# Patient Record
Sex: Female | Born: 1950
Health system: Southern US, Community
[De-identification: ages and names within clinical notes are randomized; demographics above are authoritative.]

## PROBLEM LIST (undated history)

## (undated) DIAGNOSIS — G4734 Idiopathic sleep related nonobstructive alveolar hypoventilation: Secondary | ICD-10-CM

## (undated) DIAGNOSIS — D509 Iron deficiency anemia, unspecified: Secondary | ICD-10-CM

## (undated) DIAGNOSIS — R0600 Dyspnea, unspecified: Secondary | ICD-10-CM

## (undated) DIAGNOSIS — I050 Rheumatic mitral stenosis: Secondary | ICD-10-CM

## (undated) DIAGNOSIS — IMO0001 Reserved for inherently not codable concepts without codable children: Secondary | ICD-10-CM

## (undated) DIAGNOSIS — E118 Type 2 diabetes mellitus with unspecified complications: Secondary | ICD-10-CM

## (undated) DIAGNOSIS — Z8701 Personal history of pneumonia (recurrent): Secondary | ICD-10-CM

## (undated) DIAGNOSIS — M1990 Unspecified osteoarthritis, unspecified site: Secondary | ICD-10-CM

## (undated) DIAGNOSIS — I700 Atherosclerosis of aorta: Secondary | ICD-10-CM

## (undated) DIAGNOSIS — E1140 Type 2 diabetes mellitus with diabetic neuropathy, unspecified: Secondary | ICD-10-CM

## (undated) DIAGNOSIS — F329 Major depressive disorder, single episode, unspecified: Secondary | ICD-10-CM

## (undated) DIAGNOSIS — I872 Venous insufficiency (chronic) (peripheral): Secondary | ICD-10-CM

## (undated) DIAGNOSIS — D369 Benign neoplasm, unspecified site: Secondary | ICD-10-CM

## (undated) DIAGNOSIS — Z9989 Dependence on other enabling machines and devices: Secondary | ICD-10-CM

## (undated) DIAGNOSIS — L821 Other seborrheic keratosis: Secondary | ICD-10-CM

## (undated) DIAGNOSIS — K219 Gastro-esophageal reflux disease without esophagitis: Secondary | ICD-10-CM

## (undated) DIAGNOSIS — J439 Emphysema, unspecified: Secondary | ICD-10-CM

## (undated) DIAGNOSIS — K8020 Calculus of gallbladder without cholecystitis without obstruction: Secondary | ICD-10-CM

## (undated) DIAGNOSIS — G43909 Migraine, unspecified, not intractable, without status migrainosus: Secondary | ICD-10-CM

## (undated) DIAGNOSIS — J189 Pneumonia, unspecified organism: Secondary | ICD-10-CM

## (undated) DIAGNOSIS — F322 Major depressive disorder, single episode, severe without psychotic features: Secondary | ICD-10-CM

## (undated) DIAGNOSIS — J45909 Unspecified asthma, uncomplicated: Secondary | ICD-10-CM

## (undated) DIAGNOSIS — I6529 Occlusion and stenosis of unspecified carotid artery: Secondary | ICD-10-CM

## (undated) DIAGNOSIS — Z720 Tobacco use: Secondary | ICD-10-CM

## (undated) DIAGNOSIS — Z9289 Personal history of other medical treatment: Secondary | ICD-10-CM

## (undated) DIAGNOSIS — E785 Hyperlipidemia, unspecified: Secondary | ICD-10-CM

## (undated) DIAGNOSIS — R51 Headache: Secondary | ICD-10-CM

## (undated) DIAGNOSIS — G4733 Obstructive sleep apnea (adult) (pediatric): Secondary | ICD-10-CM

## (undated) DIAGNOSIS — I5032 Chronic diastolic (congestive) heart failure: Secondary | ICD-10-CM

## (undated) DIAGNOSIS — M797 Fibromyalgia: Secondary | ICD-10-CM

## (undated) DIAGNOSIS — Z794 Long term (current) use of insulin: Secondary | ICD-10-CM

## (undated) DIAGNOSIS — G8929 Other chronic pain: Secondary | ICD-10-CM

## (undated) DIAGNOSIS — E669 Obesity, unspecified: Secondary | ICD-10-CM

## (undated) DIAGNOSIS — F419 Anxiety disorder, unspecified: Secondary | ICD-10-CM

## (undated) DIAGNOSIS — I272 Pulmonary hypertension, unspecified: Secondary | ICD-10-CM

## (undated) DIAGNOSIS — N904 Leukoplakia of vulva: Secondary | ICD-10-CM

## (undated) DIAGNOSIS — J449 Chronic obstructive pulmonary disease, unspecified: Secondary | ICD-10-CM

## (undated) DIAGNOSIS — I2723 Pulmonary hypertension due to lung diseases and hypoxia: Secondary | ICD-10-CM

## (undated) DIAGNOSIS — M545 Low back pain: Secondary | ICD-10-CM

## (undated) DIAGNOSIS — N200 Calculus of kidney: Secondary | ICD-10-CM

## (undated) DIAGNOSIS — M810 Age-related osteoporosis without current pathological fracture: Secondary | ICD-10-CM

## (undated) DIAGNOSIS — I10 Essential (primary) hypertension: Secondary | ICD-10-CM

## (undated) DIAGNOSIS — G4730 Sleep apnea, unspecified: Secondary | ICD-10-CM

## (undated) DIAGNOSIS — I480 Paroxysmal atrial fibrillation: Secondary | ICD-10-CM

## (undated) DIAGNOSIS — Z9981 Dependence on supplemental oxygen: Secondary | ICD-10-CM

## (undated) DIAGNOSIS — Z87442 Personal history of urinary calculi: Secondary | ICD-10-CM

## (undated) DIAGNOSIS — K5520 Angiodysplasia of colon without hemorrhage: Secondary | ICD-10-CM

## (undated) DIAGNOSIS — Z8719 Personal history of other diseases of the digestive system: Secondary | ICD-10-CM

## (undated) DIAGNOSIS — K222 Esophageal obstruction: Secondary | ICD-10-CM

## (undated) DIAGNOSIS — C649 Malignant neoplasm of unspecified kidney, except renal pelvis: Secondary | ICD-10-CM

## (undated) DIAGNOSIS — K5909 Other constipation: Secondary | ICD-10-CM

## (undated) DIAGNOSIS — J309 Allergic rhinitis, unspecified: Secondary | ICD-10-CM

## (undated) DIAGNOSIS — K648 Other hemorrhoids: Secondary | ICD-10-CM

## (undated) DIAGNOSIS — D638 Anemia in other chronic diseases classified elsewhere: Secondary | ICD-10-CM

## (undated) HISTORY — DX: Gastro-esophageal reflux disease without esophagitis: K21.9

## (undated) HISTORY — DX: Major depressive disorder, single episode, severe without psychotic features: F32.2

## (undated) HISTORY — DX: Sleep apnea, unspecified: G47.30

## (undated) HISTORY — DX: Emphysema, unspecified: J43.9

## (undated) HISTORY — PX: TONSILLECTOMY: SUR1361

## (undated) HISTORY — DX: Type 2 diabetes mellitus with unspecified complications: E11.8

## (undated) HISTORY — DX: Personal history of pneumonia (recurrent): Z87.01

## (undated) HISTORY — DX: Low back pain: M54.5

## (undated) HISTORY — DX: Morbid (severe) obesity due to excess calories: E66.01

## (undated) HISTORY — PX: HEMORRHOID SURGERY: SHX153

## (undated) HISTORY — DX: Tobacco use: Z72.0

## (undated) HISTORY — DX: Pulmonary hypertension due to lung diseases and hypoxia: I27.23

## (undated) HISTORY — DX: Hyperlipidemia, unspecified: E78.5

## (undated) HISTORY — PX: DILATION AND CURETTAGE OF UTERUS: SHX78

## (undated) HISTORY — DX: Allergic rhinitis, unspecified: J30.9

## (undated) HISTORY — DX: Type 2 diabetes mellitus with diabetic neuropathy, unspecified: E11.40

## (undated) HISTORY — DX: Rheumatic mitral stenosis: I05.0

## (undated) HISTORY — DX: Iron deficiency anemia, unspecified: D50.9

## (undated) HISTORY — DX: Anxiety disorder, unspecified: F41.9

## (undated) HISTORY — DX: Malignant neoplasm of unspecified kidney, except renal pelvis: C64.9

## (undated) HISTORY — DX: Other hemorrhoids: K64.8

## (undated) HISTORY — DX: Calculus of gallbladder without cholecystitis without obstruction: K80.20

## (undated) HISTORY — DX: Leukoplakia of vulva: N90.4

## (undated) HISTORY — DX: Age-related osteoporosis without current pathological fracture: M81.0

## (undated) HISTORY — DX: Idiopathic sleep related nonobstructive alveolar hypoventilation: G47.34

## (undated) HISTORY — DX: Essential (primary) hypertension: I10

## (undated) HISTORY — DX: Anemia in other chronic diseases classified elsewhere: D63.8

## (undated) HISTORY — DX: Fibromyalgia: M79.7

## (undated) HISTORY — PX: FRACTURE SURGERY: SHX138

## (undated) HISTORY — DX: Other constipation: K59.09

## (undated) HISTORY — PX: REFRACTIVE SURGERY: SHX103

## (undated) HISTORY — PX: CARDIAC CATHETERIZATION: SHX172

## (undated) HISTORY — DX: Long term (current) use of insulin: Z79.4

## (undated) HISTORY — DX: Angiodysplasia of colon without hemorrhage: K55.20

## (undated) HISTORY — DX: Benign neoplasm, unspecified site: D36.9

## (undated) HISTORY — DX: Esophageal obstruction: K22.2

## (undated) HISTORY — DX: Venous insufficiency (chronic) (peripheral): I87.2

## (undated) HISTORY — DX: Pulmonary hypertension, unspecified: I27.20

## (undated) HISTORY — DX: Occlusion and stenosis of unspecified carotid artery: I65.29

## (undated) HISTORY — PX: LITHOTRIPSY: SUR834

## (undated) HISTORY — DX: Obesity, unspecified: E66.9

## (undated) HISTORY — DX: Chronic diastolic (congestive) heart failure: I50.32

## (undated) HISTORY — PX: TUBAL LIGATION: SHX77

## (undated) HISTORY — DX: Other seborrheic keratosis: L82.1

## (undated) HISTORY — PX: CATARACT EXTRACTION W/ INTRAOCULAR LENS  IMPLANT, BILATERAL: SHX1307

## (undated) HISTORY — DX: Obstructive sleep apnea (adult) (pediatric): G47.33

## (undated) HISTORY — DX: Chronic obstructive pulmonary disease, unspecified: J44.9

## (undated) HISTORY — DX: Paroxysmal atrial fibrillation: I48.0

## (undated) HISTORY — DX: Other chronic pain: G89.29

## (undated) HISTORY — DX: Major depressive disorder, single episode, unspecified: F32.9

## (undated) HISTORY — DX: Headache: R51

---

## 1997-05-15 ENCOUNTER — Ambulatory Visit (HOSPITAL_COMMUNITY): Admission: RE | Admit: 1997-05-15 | Discharge: 1997-05-15 | Payer: Self-pay | Admitting: Neurology

## 1997-11-02 ENCOUNTER — Emergency Department (HOSPITAL_COMMUNITY): Admission: EM | Admit: 1997-11-02 | Discharge: 1997-11-02 | Payer: Self-pay | Admitting: Emergency Medicine

## 1997-11-02 ENCOUNTER — Encounter: Payer: Self-pay | Admitting: Emergency Medicine

## 1998-08-29 ENCOUNTER — Emergency Department (HOSPITAL_COMMUNITY): Admission: EM | Admit: 1998-08-29 | Discharge: 1998-08-29 | Payer: Self-pay | Admitting: Emergency Medicine

## 1998-09-06 ENCOUNTER — Ambulatory Visit (HOSPITAL_COMMUNITY): Admission: RE | Admit: 1998-09-06 | Discharge: 1998-09-06 | Payer: Self-pay | Admitting: Urology

## 1998-09-06 ENCOUNTER — Encounter: Payer: Self-pay | Admitting: Urology

## 2000-06-12 ENCOUNTER — Encounter (INDEPENDENT_AMBULATORY_CARE_PROVIDER_SITE_OTHER): Payer: Self-pay | Admitting: Specialist

## 2000-06-12 ENCOUNTER — Ambulatory Visit (HOSPITAL_COMMUNITY): Admission: RE | Admit: 2000-06-12 | Discharge: 2000-06-12 | Payer: Self-pay | Admitting: Gastroenterology

## 2000-11-11 ENCOUNTER — Encounter: Payer: Self-pay | Admitting: Emergency Medicine

## 2000-11-11 ENCOUNTER — Emergency Department (HOSPITAL_COMMUNITY): Admission: EM | Admit: 2000-11-11 | Discharge: 2000-11-11 | Payer: Self-pay | Admitting: Emergency Medicine

## 2001-04-24 ENCOUNTER — Emergency Department (HOSPITAL_COMMUNITY): Admission: EM | Admit: 2001-04-24 | Discharge: 2001-04-24 | Payer: Self-pay | Admitting: Emergency Medicine

## 2001-04-25 ENCOUNTER — Encounter: Payer: Self-pay | Admitting: Emergency Medicine

## 2001-06-10 HISTORY — PX: HYSTEROSCOPY W/ ENDOMETRIAL ABLATION: SUR665

## 2001-06-17 ENCOUNTER — Ambulatory Visit (HOSPITAL_BASED_OUTPATIENT_CLINIC_OR_DEPARTMENT_OTHER): Admission: RE | Admit: 2001-06-17 | Discharge: 2001-06-17 | Payer: Self-pay | Admitting: Obstetrics and Gynecology

## 2001-09-13 ENCOUNTER — Encounter: Admission: RE | Admit: 2001-09-13 | Discharge: 2001-09-13 | Payer: Self-pay | Admitting: Internal Medicine

## 2001-09-13 ENCOUNTER — Encounter: Payer: Self-pay | Admitting: Internal Medicine

## 2002-01-04 ENCOUNTER — Encounter: Admission: RE | Admit: 2002-01-04 | Discharge: 2002-01-04 | Payer: Self-pay | Admitting: Internal Medicine

## 2002-01-04 ENCOUNTER — Encounter: Payer: Self-pay | Admitting: Internal Medicine

## 2002-01-20 ENCOUNTER — Encounter: Admission: RE | Admit: 2002-01-20 | Discharge: 2002-01-20 | Payer: Self-pay | Admitting: Internal Medicine

## 2002-02-21 ENCOUNTER — Ambulatory Visit (HOSPITAL_COMMUNITY): Admission: RE | Admit: 2002-02-21 | Discharge: 2002-02-21 | Payer: Self-pay | Admitting: *Deleted

## 2002-02-21 ENCOUNTER — Encounter (INDEPENDENT_AMBULATORY_CARE_PROVIDER_SITE_OTHER): Payer: Self-pay | Admitting: Specialist

## 2002-09-01 ENCOUNTER — Encounter: Payer: Self-pay | Admitting: Gastroenterology

## 2002-09-01 ENCOUNTER — Encounter: Admission: RE | Admit: 2002-09-01 | Discharge: 2002-09-01 | Payer: Self-pay | Admitting: Gastroenterology

## 2003-11-15 ENCOUNTER — Encounter: Admission: RE | Admit: 2003-11-15 | Discharge: 2003-11-15 | Payer: Self-pay | Admitting: Internal Medicine

## 2003-12-30 ENCOUNTER — Emergency Department (HOSPITAL_COMMUNITY): Admission: EM | Admit: 2003-12-30 | Discharge: 2003-12-30 | Payer: Self-pay | Admitting: Emergency Medicine

## 2004-01-11 HISTORY — PX: ORIF CLAVICLE FRACTURE: SUR924

## 2004-02-07 ENCOUNTER — Ambulatory Visit (HOSPITAL_COMMUNITY): Admission: RE | Admit: 2004-02-07 | Discharge: 2004-02-08 | Payer: Self-pay | Admitting: Orthopaedic Surgery

## 2004-08-14 ENCOUNTER — Ambulatory Visit: Payer: Self-pay | Admitting: Internal Medicine

## 2004-08-16 ENCOUNTER — Ambulatory Visit: Payer: Self-pay | Admitting: Internal Medicine

## 2004-09-19 ENCOUNTER — Ambulatory Visit: Payer: Self-pay | Admitting: Internal Medicine

## 2004-10-03 ENCOUNTER — Ambulatory Visit: Payer: Self-pay | Admitting: Internal Medicine

## 2004-10-23 ENCOUNTER — Ambulatory Visit (HOSPITAL_COMMUNITY): Admission: RE | Admit: 2004-10-23 | Discharge: 2004-10-23 | Payer: Self-pay | Admitting: Internal Medicine

## 2004-10-23 ENCOUNTER — Ambulatory Visit: Payer: Self-pay | Admitting: Internal Medicine

## 2004-10-26 ENCOUNTER — Emergency Department (HOSPITAL_COMMUNITY): Admission: EM | Admit: 2004-10-26 | Discharge: 2004-10-26 | Payer: Self-pay | Admitting: Family Medicine

## 2004-11-12 ENCOUNTER — Ambulatory Visit: Payer: Self-pay | Admitting: Hospitalist

## 2004-11-29 ENCOUNTER — Ambulatory Visit: Payer: Self-pay | Admitting: Internal Medicine

## 2004-12-04 ENCOUNTER — Encounter (INDEPENDENT_AMBULATORY_CARE_PROVIDER_SITE_OTHER): Payer: Self-pay | Admitting: Internal Medicine

## 2004-12-04 ENCOUNTER — Ambulatory Visit: Payer: Self-pay | Admitting: Internal Medicine

## 2004-12-17 ENCOUNTER — Ambulatory Visit: Payer: Self-pay | Admitting: Internal Medicine

## 2004-12-18 ENCOUNTER — Ambulatory Visit: Payer: Self-pay | Admitting: Internal Medicine

## 2004-12-26 ENCOUNTER — Ambulatory Visit (HOSPITAL_COMMUNITY): Admission: RE | Admit: 2004-12-26 | Discharge: 2004-12-26 | Payer: Self-pay | Admitting: Internal Medicine

## 2005-01-07 ENCOUNTER — Encounter: Admission: RE | Admit: 2005-01-07 | Discharge: 2005-01-07 | Payer: Self-pay | Admitting: Internal Medicine

## 2005-01-07 LAB — HM DEXA SCAN

## 2005-02-21 ENCOUNTER — Ambulatory Visit: Payer: Self-pay | Admitting: Internal Medicine

## 2005-03-06 ENCOUNTER — Encounter (INDEPENDENT_AMBULATORY_CARE_PROVIDER_SITE_OTHER): Payer: Self-pay | Admitting: *Deleted

## 2005-03-06 ENCOUNTER — Ambulatory Visit (HOSPITAL_COMMUNITY): Admission: RE | Admit: 2005-03-06 | Discharge: 2005-03-06 | Payer: Self-pay | Admitting: Cardiology

## 2005-03-19 ENCOUNTER — Ambulatory Visit (HOSPITAL_COMMUNITY): Admission: RE | Admit: 2005-03-19 | Discharge: 2005-03-19 | Payer: Self-pay | Admitting: Cardiology

## 2005-03-19 ENCOUNTER — Encounter: Payer: Self-pay | Admitting: Cardiology

## 2005-03-19 ENCOUNTER — Ambulatory Visit: Payer: Self-pay | Admitting: Cardiology

## 2005-03-26 ENCOUNTER — Emergency Department (HOSPITAL_COMMUNITY): Admission: EM | Admit: 2005-03-26 | Discharge: 2005-03-26 | Payer: Self-pay | Admitting: Family Medicine

## 2005-04-24 ENCOUNTER — Ambulatory Visit: Payer: Self-pay | Admitting: Internal Medicine

## 2005-05-05 ENCOUNTER — Ambulatory Visit: Payer: Self-pay | Admitting: Cardiovascular Disease

## 2005-05-14 ENCOUNTER — Ambulatory Visit (HOSPITAL_BASED_OUTPATIENT_CLINIC_OR_DEPARTMENT_OTHER): Admission: RE | Admit: 2005-05-14 | Discharge: 2005-05-14 | Payer: Self-pay | Admitting: Internal Medicine

## 2005-05-18 ENCOUNTER — Ambulatory Visit: Payer: Self-pay | Admitting: Internal Medicine

## 2005-05-22 ENCOUNTER — Ambulatory Visit: Payer: Self-pay | Admitting: Pulmonary Disease

## 2005-06-11 ENCOUNTER — Encounter: Payer: Self-pay | Admitting: Emergency Medicine

## 2005-06-19 ENCOUNTER — Ambulatory Visit: Payer: Self-pay | Admitting: Cardiology

## 2005-06-30 ENCOUNTER — Ambulatory Visit: Payer: Self-pay | Admitting: Internal Medicine

## 2005-07-03 ENCOUNTER — Ambulatory Visit: Payer: Self-pay

## 2005-07-15 ENCOUNTER — Ambulatory Visit: Payer: Self-pay | Admitting: Internal Medicine

## 2005-08-10 HISTORY — PX: LIPOMA EXCISION: SHX5283

## 2005-08-11 ENCOUNTER — Ambulatory Visit: Payer: Self-pay | Admitting: Pulmonary Disease

## 2005-08-11 ENCOUNTER — Ambulatory Visit: Payer: Self-pay | Admitting: Internal Medicine

## 2005-09-08 ENCOUNTER — Ambulatory Visit (HOSPITAL_BASED_OUTPATIENT_CLINIC_OR_DEPARTMENT_OTHER): Admission: RE | Admit: 2005-09-08 | Discharge: 2005-09-08 | Payer: Self-pay | Admitting: General Surgery

## 2005-09-18 ENCOUNTER — Ambulatory Visit: Payer: Self-pay | Admitting: Internal Medicine

## 2005-09-23 ENCOUNTER — Ambulatory Visit: Payer: Self-pay | Admitting: Internal Medicine

## 2005-10-29 ENCOUNTER — Encounter (INDEPENDENT_AMBULATORY_CARE_PROVIDER_SITE_OTHER): Payer: Self-pay | Admitting: Internal Medicine

## 2005-10-29 LAB — HM COLONOSCOPY

## 2005-11-19 DIAGNOSIS — J441 Chronic obstructive pulmonary disease with (acute) exacerbation: Secondary | ICD-10-CM | POA: Insufficient documentation

## 2005-11-19 DIAGNOSIS — F322 Major depressive disorder, single episode, severe without psychotic features: Secondary | ICD-10-CM

## 2005-11-19 DIAGNOSIS — F321 Major depressive disorder, single episode, moderate: Secondary | ICD-10-CM | POA: Insufficient documentation

## 2005-11-19 DIAGNOSIS — M1288 Other specific arthropathies, not elsewhere classified, other specified site: Secondary | ICD-10-CM | POA: Insufficient documentation

## 2005-11-19 HISTORY — DX: Major depressive disorder, single episode, severe without psychotic features: F32.2

## 2005-11-20 DIAGNOSIS — K589 Irritable bowel syndrome without diarrhea: Secondary | ICD-10-CM | POA: Insufficient documentation

## 2005-11-20 DIAGNOSIS — E785 Hyperlipidemia, unspecified: Secondary | ICD-10-CM

## 2005-11-20 DIAGNOSIS — D126 Benign neoplasm of colon, unspecified: Secondary | ICD-10-CM | POA: Insufficient documentation

## 2005-11-20 HISTORY — DX: Hyperlipidemia, unspecified: E78.5

## 2005-11-26 ENCOUNTER — Ambulatory Visit: Payer: Self-pay | Admitting: Internal Medicine

## 2005-11-26 LAB — CONVERTED CEMR LAB
ALT: 18 units/L (ref 0–40)
AST: 14 units/L (ref 0–37)
Albumin: 4.1 g/dL (ref 3.5–5.2)
Alkaline Phosphatase: 51 units/L (ref 39–117)
BUN: 15 mg/dL (ref 6–23)
CO2: 27 meq/L (ref 19–32)
Calcium: 9.1 mg/dL (ref 8.4–10.5)
Chloride: 101 meq/L (ref 96–112)
Cholesterol: 152 mg/dL (ref 0–200)
Creatinine, Ser: 0.76 mg/dL (ref 0.40–1.20)
Glucose, Bld: 146 mg/dL — ABNORMAL HIGH (ref 70–99)
HDL: 34 mg/dL — ABNORMAL LOW (ref >39)
Hgb A1c MFr Bld: 7.5 % — ABNORMAL HIGH (ref 4.6–6.1)
LDL Cholesterol: 76 mg/dL (ref 0–99)
Potassium: 4.3 meq/L (ref 3.5–5.3)
Sodium: 139 meq/L (ref 135–145)
Total Bilirubin: 0.4 mg/dL (ref 0.3–1.2)
Total CHOL/HDL Ratio: 4.5
Total Protein: 6.9 g/dL (ref 6.0–8.3)
Triglycerides: 209 mg/dL — ABNORMAL HIGH (ref <150)
VLDL: 42 mg/dL — ABNORMAL HIGH (ref 0–40)

## 2005-12-04 ENCOUNTER — Ambulatory Visit: Payer: Self-pay | Admitting: Internal Medicine

## 2005-12-04 ENCOUNTER — Ambulatory Visit (HOSPITAL_COMMUNITY): Admission: RE | Admit: 2005-12-04 | Discharge: 2005-12-04 | Payer: Self-pay | Admitting: Internal Medicine

## 2005-12-19 ENCOUNTER — Ambulatory Visit: Payer: Self-pay | Admitting: Cardiology

## 2005-12-19 ENCOUNTER — Encounter (INDEPENDENT_AMBULATORY_CARE_PROVIDER_SITE_OTHER): Payer: Self-pay | Admitting: Internal Medicine

## 2005-12-22 ENCOUNTER — Ambulatory Visit: Payer: Self-pay | Admitting: Internal Medicine

## 2005-12-22 ENCOUNTER — Encounter (INDEPENDENT_AMBULATORY_CARE_PROVIDER_SITE_OTHER): Payer: Self-pay | Admitting: Internal Medicine

## 2005-12-22 LAB — CONVERTED CEMR LAB
Candida species: POSITIVE — AB
Gardnerella vaginalis: NEGATIVE
Trichomonal Vaginitis: NEGATIVE

## 2006-01-08 ENCOUNTER — Encounter: Admission: RE | Admit: 2006-01-08 | Discharge: 2006-01-08 | Payer: Self-pay | Admitting: Internal Medicine

## 2006-02-24 ENCOUNTER — Ambulatory Visit: Payer: Self-pay | Admitting: Internal Medicine

## 2006-02-24 LAB — CONVERTED CEMR LAB
Blood Glucose, Fingerstick: 169
Hgb A1c MFr Bld: 6.4 %

## 2006-03-05 ENCOUNTER — Telehealth (INDEPENDENT_AMBULATORY_CARE_PROVIDER_SITE_OTHER): Payer: Self-pay | Admitting: *Deleted

## 2006-03-16 ENCOUNTER — Ambulatory Visit: Payer: Self-pay

## 2006-03-16 ENCOUNTER — Encounter: Payer: Self-pay | Admitting: Cardiology

## 2006-03-20 ENCOUNTER — Telehealth (INDEPENDENT_AMBULATORY_CARE_PROVIDER_SITE_OTHER): Payer: Self-pay | Admitting: *Deleted

## 2006-04-07 ENCOUNTER — Telehealth (INDEPENDENT_AMBULATORY_CARE_PROVIDER_SITE_OTHER): Payer: Self-pay | Admitting: *Deleted

## 2006-04-16 ENCOUNTER — Ambulatory Visit: Payer: Self-pay | Admitting: Internal Medicine

## 2006-04-16 ENCOUNTER — Encounter (INDEPENDENT_AMBULATORY_CARE_PROVIDER_SITE_OTHER): Payer: Self-pay | Admitting: Internal Medicine

## 2006-04-16 LAB — CONVERTED CEMR LAB
Blood Glucose, Fingerstick: 114
TSH: 0.942 microintl units/mL (ref 0.350–5.50)

## 2006-04-21 ENCOUNTER — Telehealth (INDEPENDENT_AMBULATORY_CARE_PROVIDER_SITE_OTHER): Payer: Self-pay | Admitting: *Deleted

## 2006-05-07 IMAGING — CR DG RIBS W/ CHEST 3+V*R*
4 series · 4 of 4 positions shown · non-contrast
Comparison: none

CLINICAL DATA: Cough.  Chest pain.  Right rib pain and injury. 
CHEST AND RIGHT RIBS ? FOUR VIEWS 12/30/03:
Fracture of the mid right clavicle is present with 1.5 cm inferior displacement.  Irregularity with suggestion of a underlying lucent lesion of the right lateral fourth rib with underlying pleural thickening present.  Consider further evaluation or follow-up.  Cardiomediastinal silhouette is unremarkable.  Peribronchial thickening noted.

[view not recorded (1 of 4)]
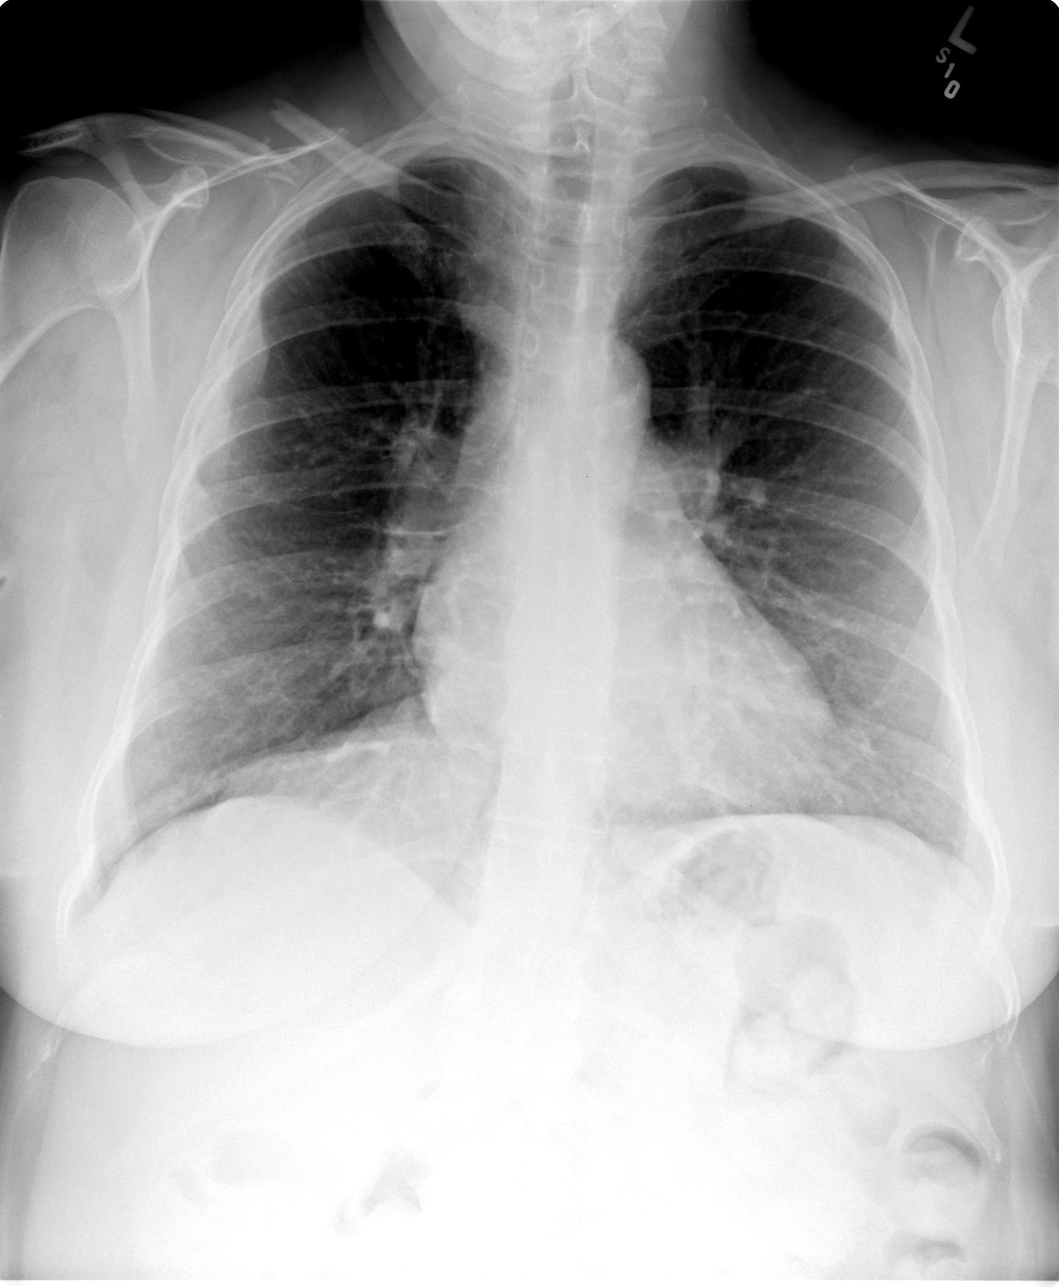

[view not recorded (2 of 4)]
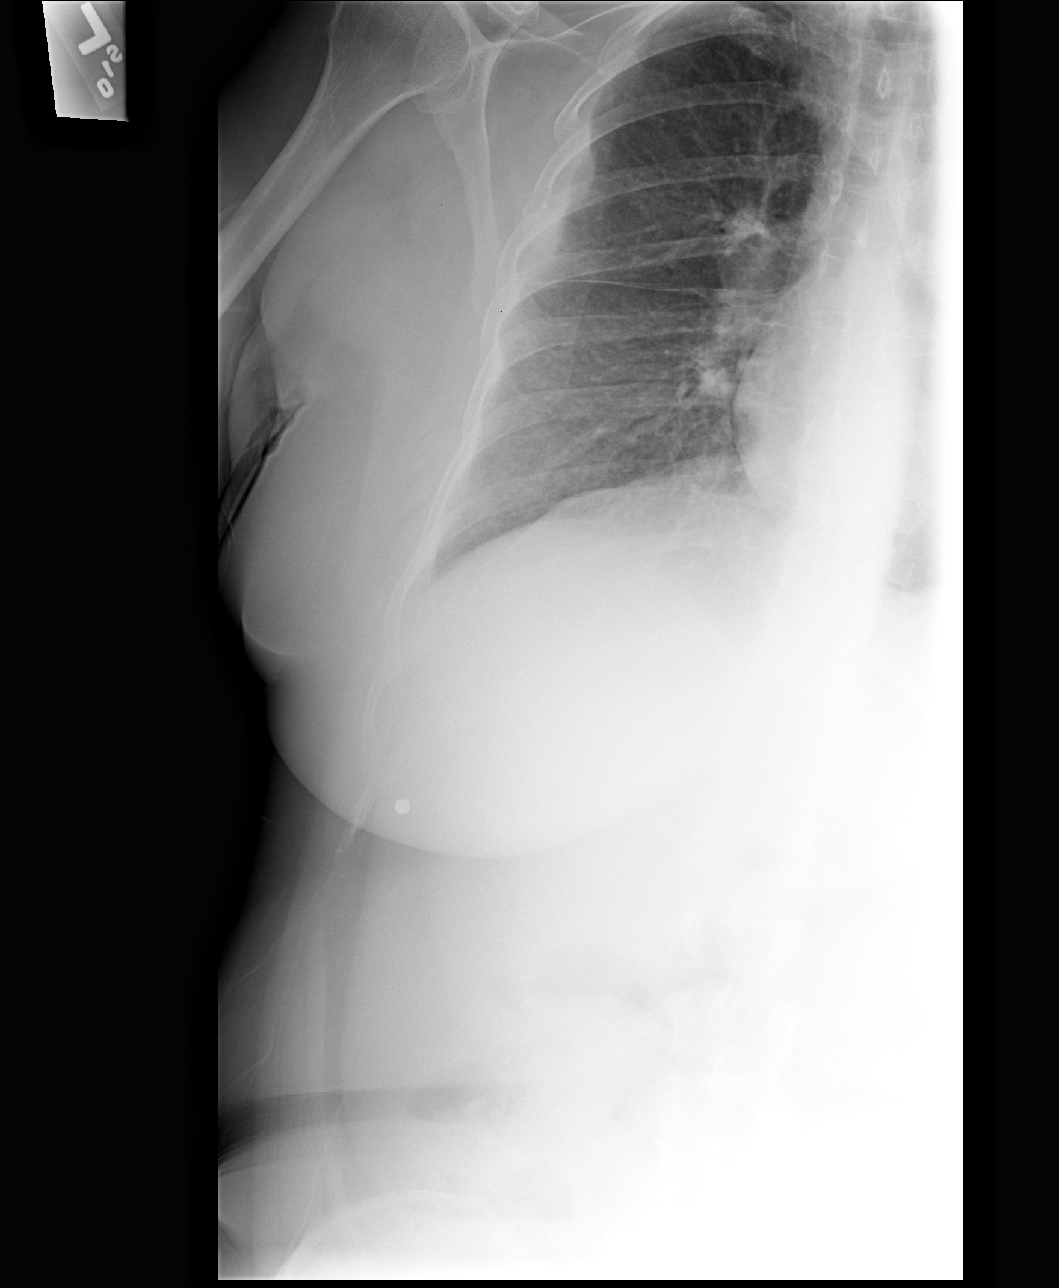

[view not recorded (3 of 4)]
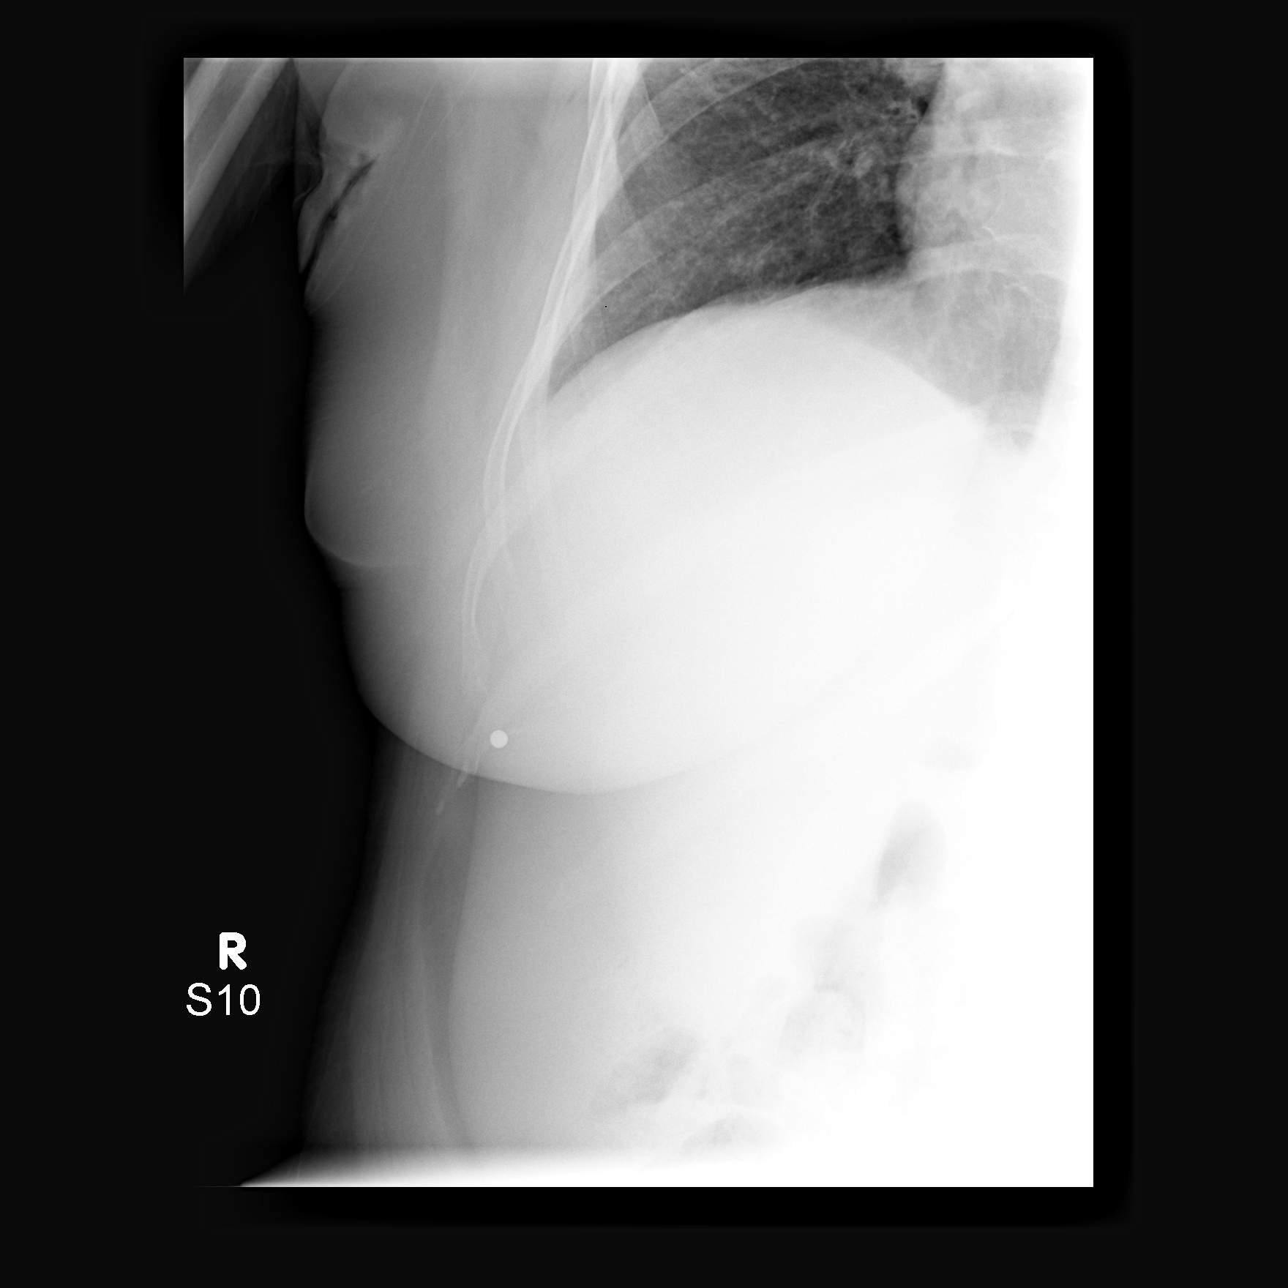

[view not recorded (4 of 4)]
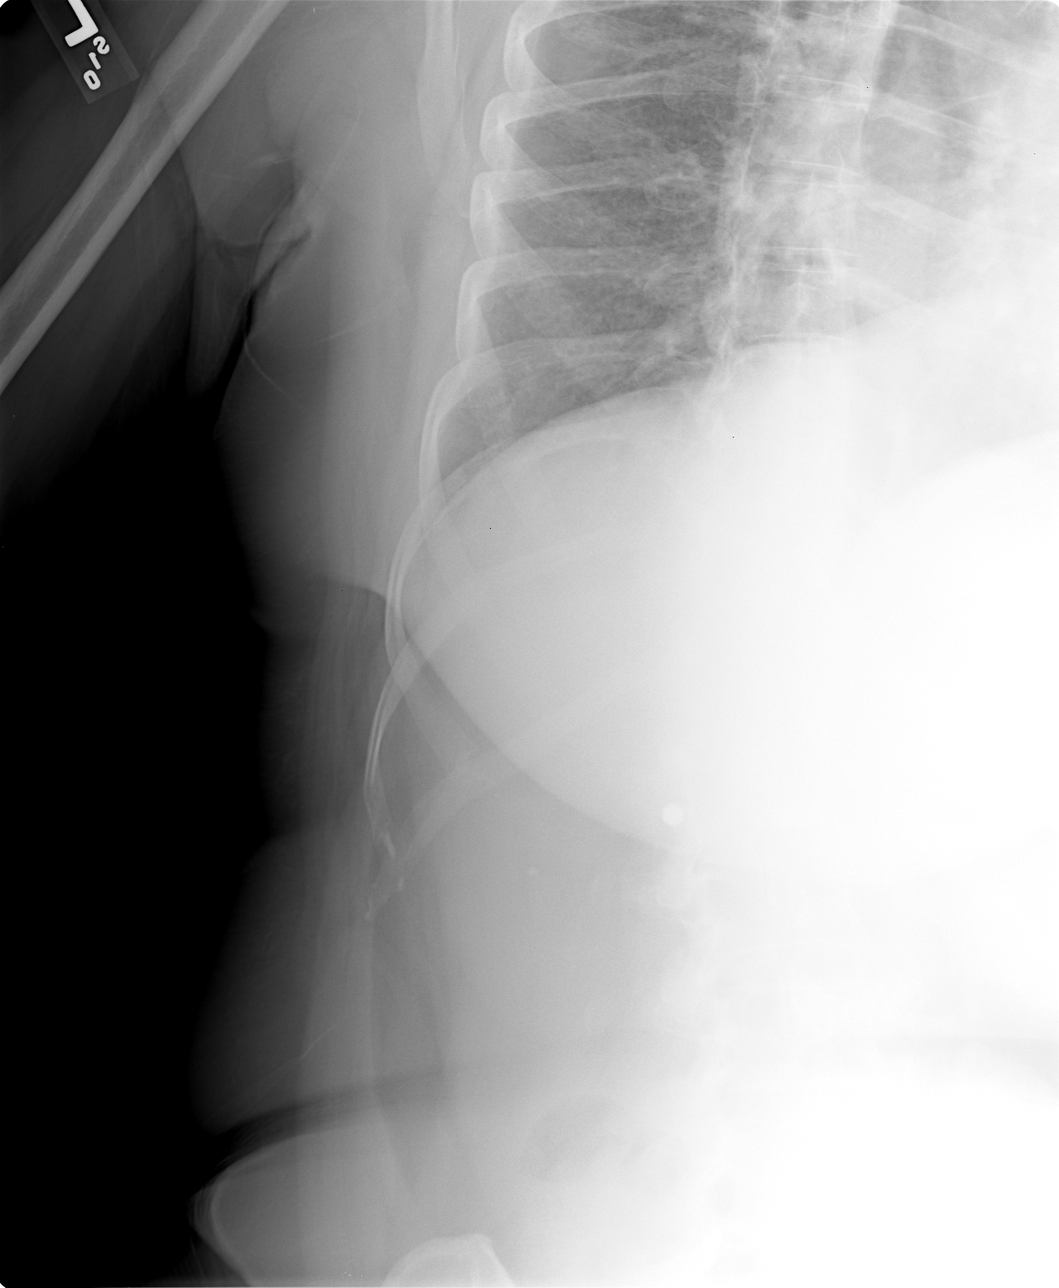

[4 of 4 positions shown; findings below may reference images not displayed]

IMPRESSION: 1.  Mid right clavicle fracture.  
2.  Irregularity of the lateral right fourth rib with underlying pleural thickening/hematoma - query fracture.  Suggestion of an underlying lucent lesion in this rib - consider interval follow-up or dedicated imaging as indicated.  
3.  Peribronchial thickening.

## 2006-05-13 ENCOUNTER — Telehealth: Payer: Self-pay | Admitting: *Deleted

## 2006-05-21 ENCOUNTER — Ambulatory Visit: Payer: Self-pay | Admitting: Pulmonary Disease

## 2006-05-26 ENCOUNTER — Telehealth: Payer: Self-pay | Admitting: *Deleted

## 2006-05-27 ENCOUNTER — Ambulatory Visit: Payer: Self-pay | Admitting: Hospitalist

## 2006-05-27 ENCOUNTER — Encounter: Payer: Self-pay | Admitting: Internal Medicine

## 2006-05-27 LAB — CONVERTED CEMR LAB
Blood Glucose, Fingerstick: 159
Hgb A1c MFr Bld: 6.1 %
Streptococcus, Group A Screen (Direct): NEGATIVE

## 2006-05-28 ENCOUNTER — Telehealth: Payer: Self-pay | Admitting: *Deleted

## 2006-06-05 ENCOUNTER — Encounter (INDEPENDENT_AMBULATORY_CARE_PROVIDER_SITE_OTHER): Payer: Self-pay | Admitting: Internal Medicine

## 2006-06-10 ENCOUNTER — Ambulatory Visit: Payer: Self-pay | Admitting: Internal Medicine

## 2006-06-11 LAB — CONVERTED CEMR LAB
Bilirubin Urine: NEGATIVE
Hemoglobin, Urine: NEGATIVE
Ketones, ur: NEGATIVE mg/dL
Leukocytes, UA: NEGATIVE
Nitrite: NEGATIVE
Protein, ur: NEGATIVE mg/dL
Specific Gravity, Urine: 1.01 (ref 1.005–1.03)
Urine Glucose: NEGATIVE mg/dL
Urobilinogen, UA: 0.2 (ref 0.0–1.0)
pH: 7 (ref 5.0–8.0)

## 2006-06-12 ENCOUNTER — Ambulatory Visit: Payer: Self-pay | Admitting: Internal Medicine

## 2006-06-13 ENCOUNTER — Encounter (INDEPENDENT_AMBULATORY_CARE_PROVIDER_SITE_OTHER): Payer: Self-pay | Admitting: Internal Medicine

## 2006-06-16 LAB — CONVERTED CEMR LAB: 5-HIAA, 24 Hr Urine: 8.3 mg/(24.h) — ABNORMAL HIGH (ref <=6.0)

## 2006-06-17 ENCOUNTER — Ambulatory Visit (HOSPITAL_COMMUNITY): Admission: RE | Admit: 2006-06-17 | Discharge: 2006-06-17 | Payer: Self-pay | Admitting: Internal Medicine

## 2006-06-18 ENCOUNTER — Ambulatory Visit: Payer: Self-pay | Admitting: Internal Medicine

## 2006-06-18 LAB — CONVERTED CEMR LAB
BUN: 18 mg/dL (ref 6–23)
CO2: 28 meq/L (ref 19–32)
Calcium: 9.5 mg/dL (ref 8.4–10.5)
Chloride: 101 meq/L (ref 96–112)
Creatinine, Ser: 0.77 mg/dL (ref 0.40–1.20)
Glucose, Bld: 73 mg/dL (ref 70–99)
Potassium: 3.8 meq/L (ref 3.5–5.3)
Sodium: 139 meq/L (ref 135–145)

## 2006-06-24 ENCOUNTER — Telehealth (INDEPENDENT_AMBULATORY_CARE_PROVIDER_SITE_OTHER): Payer: Self-pay | Admitting: Pharmacy Technician

## 2006-06-25 ENCOUNTER — Ambulatory Visit (HOSPITAL_COMMUNITY): Admission: RE | Admit: 2006-06-25 | Discharge: 2006-06-25 | Payer: Self-pay | Admitting: Internal Medicine

## 2006-06-25 ENCOUNTER — Encounter (INDEPENDENT_AMBULATORY_CARE_PROVIDER_SITE_OTHER): Payer: Self-pay | Admitting: Internal Medicine

## 2006-06-25 ENCOUNTER — Encounter: Payer: Self-pay | Admitting: Licensed Clinical Social Worker

## 2006-06-29 ENCOUNTER — Encounter (INDEPENDENT_AMBULATORY_CARE_PROVIDER_SITE_OTHER): Payer: Self-pay | Admitting: Internal Medicine

## 2006-06-29 ENCOUNTER — Ambulatory Visit: Payer: Self-pay | Admitting: Hospitalist

## 2006-06-29 LAB — CONVERTED CEMR LAB
BUN: 17 mg/dL (ref 6–23)
CO2: 26 meq/L (ref 19–32)
Calcium: 9.3 mg/dL (ref 8.4–10.5)
Chloride: 100 meq/L (ref 96–112)
Creatinine, Ser: 0.71 mg/dL (ref 0.40–1.20)
Glucose, Bld: 166 mg/dL — ABNORMAL HIGH (ref 70–99)
Potassium: 3.8 meq/L (ref 3.5–5.3)
Sodium: 138 meq/L (ref 135–145)

## 2006-06-30 ENCOUNTER — Telehealth (INDEPENDENT_AMBULATORY_CARE_PROVIDER_SITE_OTHER): Payer: Self-pay | Admitting: *Deleted

## 2006-06-30 ENCOUNTER — Encounter (INDEPENDENT_AMBULATORY_CARE_PROVIDER_SITE_OTHER): Payer: Self-pay | Admitting: Internal Medicine

## 2006-06-30 LAB — CONVERTED CEMR LAB: Blood Glucose, Fingerstick: 159

## 2006-07-29 ENCOUNTER — Ambulatory Visit: Payer: Self-pay | Admitting: Internal Medicine

## 2006-07-29 LAB — CONVERTED CEMR LAB: Blood Glucose, Fingerstick: 124

## 2006-08-07 ENCOUNTER — Telehealth: Payer: Self-pay | Admitting: *Deleted

## 2006-08-13 ENCOUNTER — Telehealth: Payer: Self-pay | Admitting: *Deleted

## 2006-08-21 ENCOUNTER — Emergency Department (HOSPITAL_COMMUNITY): Admission: EM | Admit: 2006-08-21 | Discharge: 2006-08-22 | Payer: Self-pay | Admitting: Emergency Medicine

## 2006-08-31 ENCOUNTER — Ambulatory Visit (HOSPITAL_COMMUNITY): Admission: RE | Admit: 2006-08-31 | Discharge: 2006-08-31 | Payer: Self-pay | Admitting: Gastroenterology

## 2006-09-01 ENCOUNTER — Telehealth: Payer: Self-pay | Admitting: *Deleted

## 2006-09-16 ENCOUNTER — Ambulatory Visit: Payer: Self-pay | Admitting: Internal Medicine

## 2006-09-16 LAB — CONVERTED CEMR LAB
Blood Glucose, Fingerstick: 161
Hgb A1c MFr Bld: 6.9 %

## 2006-09-29 ENCOUNTER — Telehealth: Payer: Self-pay | Admitting: *Deleted

## 2006-09-30 ENCOUNTER — Telehealth: Payer: Self-pay | Admitting: *Deleted

## 2006-10-15 ENCOUNTER — Encounter (INDEPENDENT_AMBULATORY_CARE_PROVIDER_SITE_OTHER): Payer: Self-pay | Admitting: *Deleted

## 2006-10-16 ENCOUNTER — Encounter (INDEPENDENT_AMBULATORY_CARE_PROVIDER_SITE_OTHER): Payer: Self-pay | Admitting: *Deleted

## 2006-10-16 DIAGNOSIS — E119 Type 2 diabetes mellitus without complications: Secondary | ICD-10-CM | POA: Insufficient documentation

## 2006-10-19 ENCOUNTER — Telehealth: Payer: Self-pay | Admitting: *Deleted

## 2006-10-19 ENCOUNTER — Ambulatory Visit: Payer: Self-pay | Admitting: Internal Medicine

## 2006-10-27 ENCOUNTER — Ambulatory Visit: Payer: Self-pay | Admitting: *Deleted

## 2006-10-29 ENCOUNTER — Ambulatory Visit: Payer: Self-pay | Admitting: *Deleted

## 2006-10-29 LAB — CONVERTED CEMR LAB: 5-HIAA, 24 Hr Urine: 7.8 mg/(24.h) — ABNORMAL HIGH (ref <=6.0)

## 2006-11-03 ENCOUNTER — Emergency Department (HOSPITAL_COMMUNITY): Admission: EM | Admit: 2006-11-03 | Discharge: 2006-11-03 | Payer: Self-pay | Admitting: Family Medicine

## 2006-11-04 ENCOUNTER — Telehealth: Payer: Self-pay | Admitting: *Deleted

## 2006-11-09 ENCOUNTER — Telehealth (INDEPENDENT_AMBULATORY_CARE_PROVIDER_SITE_OTHER): Payer: Self-pay | Admitting: Pharmacy Technician

## 2006-11-10 ENCOUNTER — Telehealth: Payer: Self-pay | Admitting: *Deleted

## 2006-11-12 ENCOUNTER — Telehealth: Payer: Self-pay | Admitting: *Deleted

## 2006-11-13 ENCOUNTER — Ambulatory Visit: Payer: Self-pay | Admitting: Hospitalist

## 2006-11-23 ENCOUNTER — Telehealth: Payer: Self-pay | Admitting: *Deleted

## 2006-12-03 ENCOUNTER — Telehealth: Payer: Self-pay | Admitting: *Deleted

## 2006-12-08 ENCOUNTER — Ambulatory Visit: Payer: Self-pay | Admitting: *Deleted

## 2006-12-17 ENCOUNTER — Telehealth (INDEPENDENT_AMBULATORY_CARE_PROVIDER_SITE_OTHER): Payer: Self-pay | Admitting: Pharmacy Technician

## 2006-12-22 ENCOUNTER — Ambulatory Visit: Payer: Self-pay | Admitting: Cardiology

## 2006-12-23 ENCOUNTER — Telehealth (INDEPENDENT_AMBULATORY_CARE_PROVIDER_SITE_OTHER): Payer: Self-pay | Admitting: Pharmacy Technician

## 2007-01-01 ENCOUNTER — Telehealth: Payer: Self-pay | Admitting: *Deleted

## 2007-01-05 ENCOUNTER — Telehealth (INDEPENDENT_AMBULATORY_CARE_PROVIDER_SITE_OTHER): Payer: Self-pay | Admitting: *Deleted

## 2007-01-05 ENCOUNTER — Encounter (INDEPENDENT_AMBULATORY_CARE_PROVIDER_SITE_OTHER): Payer: Self-pay | Admitting: Internal Medicine

## 2007-01-05 ENCOUNTER — Encounter (INDEPENDENT_AMBULATORY_CARE_PROVIDER_SITE_OTHER): Payer: Self-pay | Admitting: Hospitalist

## 2007-01-05 ENCOUNTER — Ambulatory Visit: Payer: Self-pay | Admitting: Hospitalist

## 2007-01-05 ENCOUNTER — Ambulatory Visit (HOSPITAL_COMMUNITY): Admission: RE | Admit: 2007-01-05 | Discharge: 2007-01-05 | Payer: Self-pay | Admitting: Hospitalist

## 2007-01-05 ENCOUNTER — Ambulatory Visit: Payer: Self-pay | Admitting: *Deleted

## 2007-01-05 LAB — CONVERTED CEMR LAB
ALT: 17 units/L (ref 0–35)
AST: 16 units/L (ref 0–37)
Albumin: 3.6 g/dL (ref 3.5–5.2)
Alkaline Phosphatase: 46 units/L (ref 39–117)
BUN: 19 mg/dL (ref 6–23)
CO2: 30 meq/L (ref 19–32)
Calcium: 9.4 mg/dL (ref 8.4–10.5)
Chloride: 98 meq/L (ref 96–112)
Creatinine, Ser: 0.74 mg/dL (ref 0.40–1.20)
Glucose, Bld: 99 mg/dL (ref 70–99)
Potassium: 3.5 meq/L (ref 3.5–5.3)
Sodium: 137 meq/L (ref 135–145)
Total Bilirubin: 0.7 mg/dL (ref 0.3–1.2)
Total CK: 51 units/L (ref 7–177)
Total Protein: 6.9 g/dL (ref 6.0–8.3)

## 2007-02-01 ENCOUNTER — Telehealth: Payer: Self-pay | Admitting: Internal Medicine

## 2007-02-03 ENCOUNTER — Telehealth: Payer: Self-pay | Admitting: Internal Medicine

## 2007-02-18 ENCOUNTER — Ambulatory Visit: Payer: Self-pay | Admitting: *Deleted

## 2007-03-02 ENCOUNTER — Telehealth: Payer: Self-pay | Admitting: Infectious Disease

## 2007-03-02 ENCOUNTER — Ambulatory Visit: Payer: Self-pay | Admitting: Internal Medicine

## 2007-03-09 ENCOUNTER — Telehealth: Payer: Self-pay | Admitting: *Deleted

## 2007-03-24 ENCOUNTER — Ambulatory Visit: Payer: Self-pay | Admitting: *Deleted

## 2007-03-24 ENCOUNTER — Telehealth (INDEPENDENT_AMBULATORY_CARE_PROVIDER_SITE_OTHER): Payer: Self-pay | Admitting: Pharmacy Technician

## 2007-03-30 ENCOUNTER — Ambulatory Visit: Payer: Self-pay | Admitting: *Deleted

## 2007-03-30 ENCOUNTER — Encounter: Admission: RE | Admit: 2007-03-30 | Discharge: 2007-03-30 | Payer: Self-pay | Admitting: *Deleted

## 2007-03-30 DIAGNOSIS — D649 Anemia, unspecified: Secondary | ICD-10-CM

## 2007-03-30 LAB — CONVERTED CEMR LAB
BUN: 18 mg/dL (ref 6–23)
CO2: 26 meq/L (ref 19–32)
Calcium: 9.5 mg/dL (ref 8.4–10.5)
Chloride: 98 meq/L (ref 96–112)
Cholesterol: 128 mg/dL (ref 0–200)
Creatinine, Ser: 0.79 mg/dL (ref 0.40–1.20)
Creatinine, Urine: 43.9 mg/dL
Glucose, Bld: 85 mg/dL (ref 70–99)
HCT: 27.6 % — ABNORMAL LOW (ref 36.0–46.0)
HDL: 35 mg/dL — ABNORMAL LOW (ref >39)
Hemoglobin: 8.1 g/dL — ABNORMAL LOW (ref 12.0–15.0)
LDL Cholesterol: 67 mg/dL (ref 0–99)
MCHC: 29.3 g/dL — ABNORMAL LOW (ref 30.0–36.0)
MCV: 73.8 fL — ABNORMAL LOW (ref 78.0–100.0)
Microalb Creat Ratio: 26.2 mg/g (ref 0.0–30.0)
Microalb, Ur: 1.15 mg/dL (ref 0.00–1.89)
Platelets: 294 10*3/uL (ref 150–400)
Potassium: 4 meq/L (ref 3.5–5.3)
RBC: 3.74 M/uL — ABNORMAL LOW (ref 3.87–5.11)
RDW: 19.4 % — ABNORMAL HIGH (ref 11.5–15.5)
Sodium: 136 meq/L (ref 135–145)
Total CHOL/HDL Ratio: 3.7
Triglycerides: 129 mg/dL (ref <150)
VLDL: 26 mg/dL (ref 0–40)
WBC: 8.9 10*3/uL (ref 4.0–10.5)

## 2007-03-31 ENCOUNTER — Ambulatory Visit (HOSPITAL_COMMUNITY): Admission: RE | Admit: 2007-03-31 | Discharge: 2007-03-31 | Payer: Self-pay | Admitting: *Deleted

## 2007-04-02 ENCOUNTER — Encounter (INDEPENDENT_AMBULATORY_CARE_PROVIDER_SITE_OTHER): Payer: Self-pay | Admitting: *Deleted

## 2007-04-02 ENCOUNTER — Ambulatory Visit: Payer: Self-pay | Admitting: Internal Medicine

## 2007-04-02 LAB — CONVERTED CEMR LAB
HCT: 31.9 % — ABNORMAL LOW (ref 36.0–46.0)
Hemoglobin: 10.3 g/dL — ABNORMAL LOW (ref 12.0–15.0)
MCHC: 32.2 g/dL (ref 30.0–36.0)
MCV: 73.2 fL — ABNORMAL LOW (ref 78.0–100.0)
Platelets: 308 10*3/uL (ref 150–400)
RBC: 4.35 M/uL (ref 3.87–5.11)
RDW: 22.1 % — ABNORMAL HIGH (ref 11.5–15.5)
WBC: 9.9 10*3/uL (ref 4.0–10.5)

## 2007-04-12 ENCOUNTER — Telehealth: Payer: Self-pay | Admitting: *Deleted

## 2007-04-20 ENCOUNTER — Encounter: Payer: Self-pay | Admitting: Cardiology

## 2007-04-20 ENCOUNTER — Ambulatory Visit: Payer: Self-pay

## 2007-04-23 ENCOUNTER — Encounter (INDEPENDENT_AMBULATORY_CARE_PROVIDER_SITE_OTHER): Payer: Self-pay | Admitting: *Deleted

## 2007-04-28 ENCOUNTER — Telehealth (INDEPENDENT_AMBULATORY_CARE_PROVIDER_SITE_OTHER): Payer: Self-pay | Admitting: *Deleted

## 2007-04-30 ENCOUNTER — Encounter: Payer: Self-pay | Admitting: Licensed Clinical Social Worker

## 2007-05-03 ENCOUNTER — Telehealth (INDEPENDENT_AMBULATORY_CARE_PROVIDER_SITE_OTHER): Payer: Self-pay | Admitting: *Deleted

## 2007-05-04 ENCOUNTER — Encounter: Payer: Self-pay | Admitting: Licensed Clinical Social Worker

## 2007-05-07 ENCOUNTER — Ambulatory Visit: Payer: Self-pay | Admitting: *Deleted

## 2007-05-07 LAB — CONVERTED CEMR LAB
HCT: 35.2 % — ABNORMAL LOW (ref 36.0–46.0)
Hemoglobin: 11.6 g/dL — ABNORMAL LOW (ref 12.0–15.0)
MCHC: 33 g/dL (ref 30.0–36.0)
MCV: 76.1 fL — ABNORMAL LOW (ref 78.0–100.0)
Platelets: 242 10*3/uL (ref 150–400)
RBC: 4.63 M/uL (ref 3.87–5.11)
RDW: 22.7 % — ABNORMAL HIGH (ref 11.5–15.5)
WBC: 8.8 10*3/uL (ref 4.0–10.5)

## 2007-06-01 ENCOUNTER — Ambulatory Visit: Payer: Self-pay | Admitting: Cardiology

## 2007-06-02 ENCOUNTER — Encounter (INDEPENDENT_AMBULATORY_CARE_PROVIDER_SITE_OTHER): Payer: Self-pay | Admitting: *Deleted

## 2007-06-29 ENCOUNTER — Ambulatory Visit: Payer: Self-pay | Admitting: *Deleted

## 2007-06-29 ENCOUNTER — Encounter (INDEPENDENT_AMBULATORY_CARE_PROVIDER_SITE_OTHER): Payer: Self-pay | Admitting: *Deleted

## 2007-06-29 LAB — CONVERTED CEMR LAB
Blood Glucose, Fingerstick: 143
Candida species: NEGATIVE
Chlamydia, DNA Probe: NEGATIVE
GC Probe Amp, Genital: NEGATIVE
Gardnerella vaginalis: NEGATIVE
Hgb A1c MFr Bld: 6.2 %
Trichomonal Vaginitis: NEGATIVE

## 2007-06-30 ENCOUNTER — Telehealth (INDEPENDENT_AMBULATORY_CARE_PROVIDER_SITE_OTHER): Payer: Self-pay | Admitting: *Deleted

## 2007-07-02 ENCOUNTER — Telehealth (INDEPENDENT_AMBULATORY_CARE_PROVIDER_SITE_OTHER): Payer: Self-pay | Admitting: *Deleted

## 2007-07-06 ENCOUNTER — Telehealth: Payer: Self-pay | Admitting: Licensed Clinical Social Worker

## 2007-07-28 ENCOUNTER — Ambulatory Visit: Payer: Self-pay | Admitting: *Deleted

## 2007-07-28 ENCOUNTER — Telehealth: Payer: Self-pay | Admitting: *Deleted

## 2007-07-28 ENCOUNTER — Encounter: Payer: Self-pay | Admitting: Licensed Clinical Social Worker

## 2007-07-28 ENCOUNTER — Ambulatory Visit (HOSPITAL_COMMUNITY): Admission: RE | Admit: 2007-07-28 | Discharge: 2007-07-28 | Payer: Self-pay | Admitting: *Deleted

## 2007-07-28 LAB — CONVERTED CEMR LAB: Blood Glucose, Fingerstick: 159

## 2007-08-03 ENCOUNTER — Encounter: Admission: RE | Admit: 2007-08-03 | Discharge: 2007-08-03 | Payer: Self-pay | Admitting: *Deleted

## 2007-08-04 ENCOUNTER — Telehealth: Payer: Self-pay | Admitting: Licensed Clinical Social Worker

## 2007-08-12 ENCOUNTER — Encounter: Admission: RE | Admit: 2007-08-12 | Discharge: 2007-11-09 | Payer: Self-pay | Admitting: *Deleted

## 2007-08-16 ENCOUNTER — Telehealth (INDEPENDENT_AMBULATORY_CARE_PROVIDER_SITE_OTHER): Payer: Self-pay | Admitting: *Deleted

## 2007-08-24 ENCOUNTER — Ambulatory Visit: Payer: Self-pay | Admitting: *Deleted

## 2007-08-25 LAB — CONVERTED CEMR LAB
ALT: 17 units/L (ref 0–35)
AST: 14 units/L (ref 0–37)
Albumin: 4.1 g/dL (ref 3.5–5.2)
Alkaline Phosphatase: 49 units/L (ref 39–117)
BUN: 17 mg/dL (ref 6–23)
CO2: 24 meq/L (ref 19–32)
Calcium: 9.9 mg/dL (ref 8.4–10.5)
Chloride: 102 meq/L (ref 96–112)
Creatinine, Ser: 0.75 mg/dL (ref 0.40–1.20)
Glucose, Bld: 186 mg/dL — ABNORMAL HIGH (ref 70–99)
HCT: 41.2 % (ref 36.0–46.0)
Hemoglobin: 13.4 g/dL (ref 12.0–15.0)
MCHC: 32.5 g/dL (ref 30.0–36.0)
MCV: 92.2 fL (ref 78.0–100.0)
Platelets: 227 10*3/uL (ref 150–400)
Potassium: 4.2 meq/L (ref 3.5–5.3)
RBC: 4.47 M/uL (ref 3.87–5.11)
RDW: 15.6 % — ABNORMAL HIGH (ref 11.5–15.5)
Sodium: 141 meq/L (ref 135–145)
Total Bilirubin: 0.3 mg/dL (ref 0.3–1.2)
Total Protein: 6.6 g/dL (ref 6.0–8.3)
WBC: 11 10*3/uL — ABNORMAL HIGH (ref 4.0–10.5)

## 2007-08-27 ENCOUNTER — Encounter (INDEPENDENT_AMBULATORY_CARE_PROVIDER_SITE_OTHER): Payer: Self-pay | Admitting: *Deleted

## 2007-09-01 ENCOUNTER — Encounter (INDEPENDENT_AMBULATORY_CARE_PROVIDER_SITE_OTHER): Payer: Self-pay | Admitting: *Deleted

## 2007-09-01 ENCOUNTER — Ambulatory Visit: Payer: Self-pay | Admitting: Internal Medicine

## 2007-09-01 LAB — CONVERTED CEMR LAB
Anti Nuclear Antibody(ANA): NEGATIVE
Basophils Absolute: 0.1 10*3/uL (ref 0.0–0.1)
Basophils Relative: 1 % (ref 0–1)
Eosinophils Absolute: 0.9 10*3/uL — ABNORMAL HIGH (ref 0.0–0.7)
Eosinophils Relative: 9 % — ABNORMAL HIGH (ref 0–5)
HCT: 43 % (ref 36.0–46.0)
Hemoglobin: 14.2 g/dL (ref 12.0–15.0)
Lymphocytes Relative: 24 % (ref 12–46)
Lymphs Abs: 2.5 10*3/uL (ref 0.7–4.0)
MCHC: 33 g/dL (ref 30.0–36.0)
MCV: 90.7 fL (ref 78.0–100.0)
Monocytes Absolute: 0.8 10*3/uL (ref 0.1–1.0)
Monocytes Relative: 8 % (ref 3–12)
Neutro Abs: 5.9 10*3/uL (ref 1.7–7.7)
Neutrophils Relative %: 58 % (ref 43–77)
Platelets: 259 10*3/uL (ref 150–400)
RBC: 4.74 M/uL (ref 3.87–5.11)
RDW: 15.5 % (ref 11.5–15.5)
Sed Rate: 6 mm/hr (ref 0–22)
WBC: 10.1 10*3/uL (ref 4.0–10.5)

## 2007-09-02 ENCOUNTER — Encounter (INDEPENDENT_AMBULATORY_CARE_PROVIDER_SITE_OTHER): Payer: Self-pay | Admitting: Internal Medicine

## 2007-09-06 LAB — CONVERTED CEMR LAB: 5-HIAA, 24 Hr Urine: 9.2 mg/(24.h) — ABNORMAL HIGH (ref <=6.0)

## 2007-09-09 LAB — HM DIABETES EYE EXAM: HM Diabetic Eye Exam: NEGATIVE

## 2007-09-20 ENCOUNTER — Ambulatory Visit: Payer: Self-pay | Admitting: *Deleted

## 2007-09-21 ENCOUNTER — Encounter (INDEPENDENT_AMBULATORY_CARE_PROVIDER_SITE_OTHER): Payer: Self-pay | Admitting: *Deleted

## 2007-09-25 ENCOUNTER — Telehealth: Payer: Self-pay | Admitting: Internal Medicine

## 2007-10-01 ENCOUNTER — Telehealth (INDEPENDENT_AMBULATORY_CARE_PROVIDER_SITE_OTHER): Payer: Self-pay | Admitting: *Deleted

## 2007-10-20 ENCOUNTER — Telehealth: Payer: Self-pay | Admitting: *Deleted

## 2007-11-04 ENCOUNTER — Telehealth: Payer: Self-pay | Admitting: Internal Medicine

## 2007-12-06 ENCOUNTER — Telehealth: Payer: Self-pay | Admitting: *Deleted

## 2007-12-07 ENCOUNTER — Ambulatory Visit: Payer: Self-pay | Admitting: *Deleted

## 2007-12-07 ENCOUNTER — Telehealth (INDEPENDENT_AMBULATORY_CARE_PROVIDER_SITE_OTHER): Payer: Self-pay | Admitting: *Deleted

## 2007-12-07 LAB — CONVERTED CEMR LAB
ALT: 32 units/L (ref 0–35)
AST: 21 units/L (ref 0–37)
Albumin: 4.4 g/dL (ref 3.5–5.2)
Alkaline Phosphatase: 46 units/L (ref 39–117)
BUN: 14 mg/dL (ref 6–23)
CO2: 25 meq/L (ref 19–32)
Calcium: 10.1 mg/dL (ref 8.4–10.5)
Chloride: 106 meq/L (ref 96–112)
Creatinine, Ser: 0.89 mg/dL (ref 0.40–1.20)
Glucose, Bld: 112 mg/dL — ABNORMAL HIGH (ref 70–99)
Hgb A1c MFr Bld: 6.7 %
Potassium: 4 meq/L (ref 3.5–5.3)
Sodium: 144 meq/L (ref 135–145)
Total Bilirubin: 0.3 mg/dL (ref 0.3–1.2)
Total Protein: 6.9 g/dL (ref 6.0–8.3)

## 2007-12-27 ENCOUNTER — Ambulatory Visit: Payer: Self-pay | Admitting: *Deleted

## 2007-12-29 ENCOUNTER — Telehealth: Payer: Self-pay | Admitting: Licensed Clinical Social Worker

## 2008-01-11 ENCOUNTER — Ambulatory Visit (HOSPITAL_COMMUNITY): Admission: RE | Admit: 2008-01-11 | Discharge: 2008-01-11 | Payer: Self-pay | Admitting: Internal Medicine

## 2008-01-11 ENCOUNTER — Telehealth: Payer: Self-pay | Admitting: *Deleted

## 2008-01-11 ENCOUNTER — Telehealth: Payer: Self-pay | Admitting: Internal Medicine

## 2008-01-11 ENCOUNTER — Ambulatory Visit: Payer: Self-pay | Admitting: Internal Medicine

## 2008-01-11 LAB — CONVERTED CEMR LAB
Bilirubin Urine: NEGATIVE
Glucose, Urine, Semiquant: NEGATIVE
Ketones, urine, test strip: NEGATIVE
Nitrite: NEGATIVE
Protein, U semiquant: 30
Specific Gravity, Urine: 1.02
Urobilinogen, UA: 0.2
WBC Urine, dipstick: NEGATIVE
pH: 7

## 2008-01-12 ENCOUNTER — Encounter (INDEPENDENT_AMBULATORY_CARE_PROVIDER_SITE_OTHER): Payer: Self-pay | Admitting: Internal Medicine

## 2008-01-12 LAB — CONVERTED CEMR LAB
Bilirubin Urine: NEGATIVE
Hemoglobin, Urine: NEGATIVE
Ketones, ur: NEGATIVE mg/dL
Leukocytes, UA: NEGATIVE
Nitrite: NEGATIVE
Protein, ur: NEGATIVE mg/dL
Specific Gravity, Urine: 1.017 (ref 1.005–1.03)
Urine Glucose: NEGATIVE mg/dL
Urobilinogen, UA: 0.2 (ref 0.0–1.0)
pH: 7 (ref 5.0–8.0)

## 2008-01-14 ENCOUNTER — Ambulatory Visit: Payer: Self-pay | Admitting: Internal Medicine

## 2008-01-16 ENCOUNTER — Telehealth (INDEPENDENT_AMBULATORY_CARE_PROVIDER_SITE_OTHER): Payer: Self-pay | Admitting: Internal Medicine

## 2008-01-18 ENCOUNTER — Ambulatory Visit: Payer: Self-pay | Admitting: Internal Medicine

## 2008-01-18 ENCOUNTER — Encounter (INDEPENDENT_AMBULATORY_CARE_PROVIDER_SITE_OTHER): Payer: Self-pay | Admitting: Internal Medicine

## 2008-01-18 DIAGNOSIS — Z87898 Personal history of other specified conditions: Secondary | ICD-10-CM | POA: Insufficient documentation

## 2008-02-02 ENCOUNTER — Ambulatory Visit: Payer: Self-pay | Admitting: Internal Medicine

## 2008-02-07 ENCOUNTER — Telehealth (INDEPENDENT_AMBULATORY_CARE_PROVIDER_SITE_OTHER): Payer: Self-pay | Admitting: *Deleted

## 2008-02-25 ENCOUNTER — Encounter (INDEPENDENT_AMBULATORY_CARE_PROVIDER_SITE_OTHER): Payer: Self-pay | Admitting: *Deleted

## 2008-03-06 ENCOUNTER — Telehealth: Payer: Self-pay | Admitting: *Deleted

## 2008-03-14 ENCOUNTER — Telehealth: Payer: Self-pay | Admitting: *Deleted

## 2008-04-04 ENCOUNTER — Telehealth (INDEPENDENT_AMBULATORY_CARE_PROVIDER_SITE_OTHER): Payer: Self-pay | Admitting: *Deleted

## 2008-04-06 ENCOUNTER — Emergency Department (HOSPITAL_COMMUNITY): Admission: EM | Admit: 2008-04-06 | Discharge: 2008-04-06 | Payer: Self-pay | Admitting: Family Medicine

## 2008-04-10 ENCOUNTER — Telehealth: Payer: Self-pay | Admitting: Licensed Clinical Social Worker

## 2008-04-11 IMAGING — CR DG CHEST 2V
2 series · 2 of 2 positions shown · non-contrast
Comparison: 02/06/04.

CLINICAL DATA: Cough.  Short of breath.   History of hypertension and smoking.
CHEST - 2 VIEWS:

[view not recorded (1 of 2)]
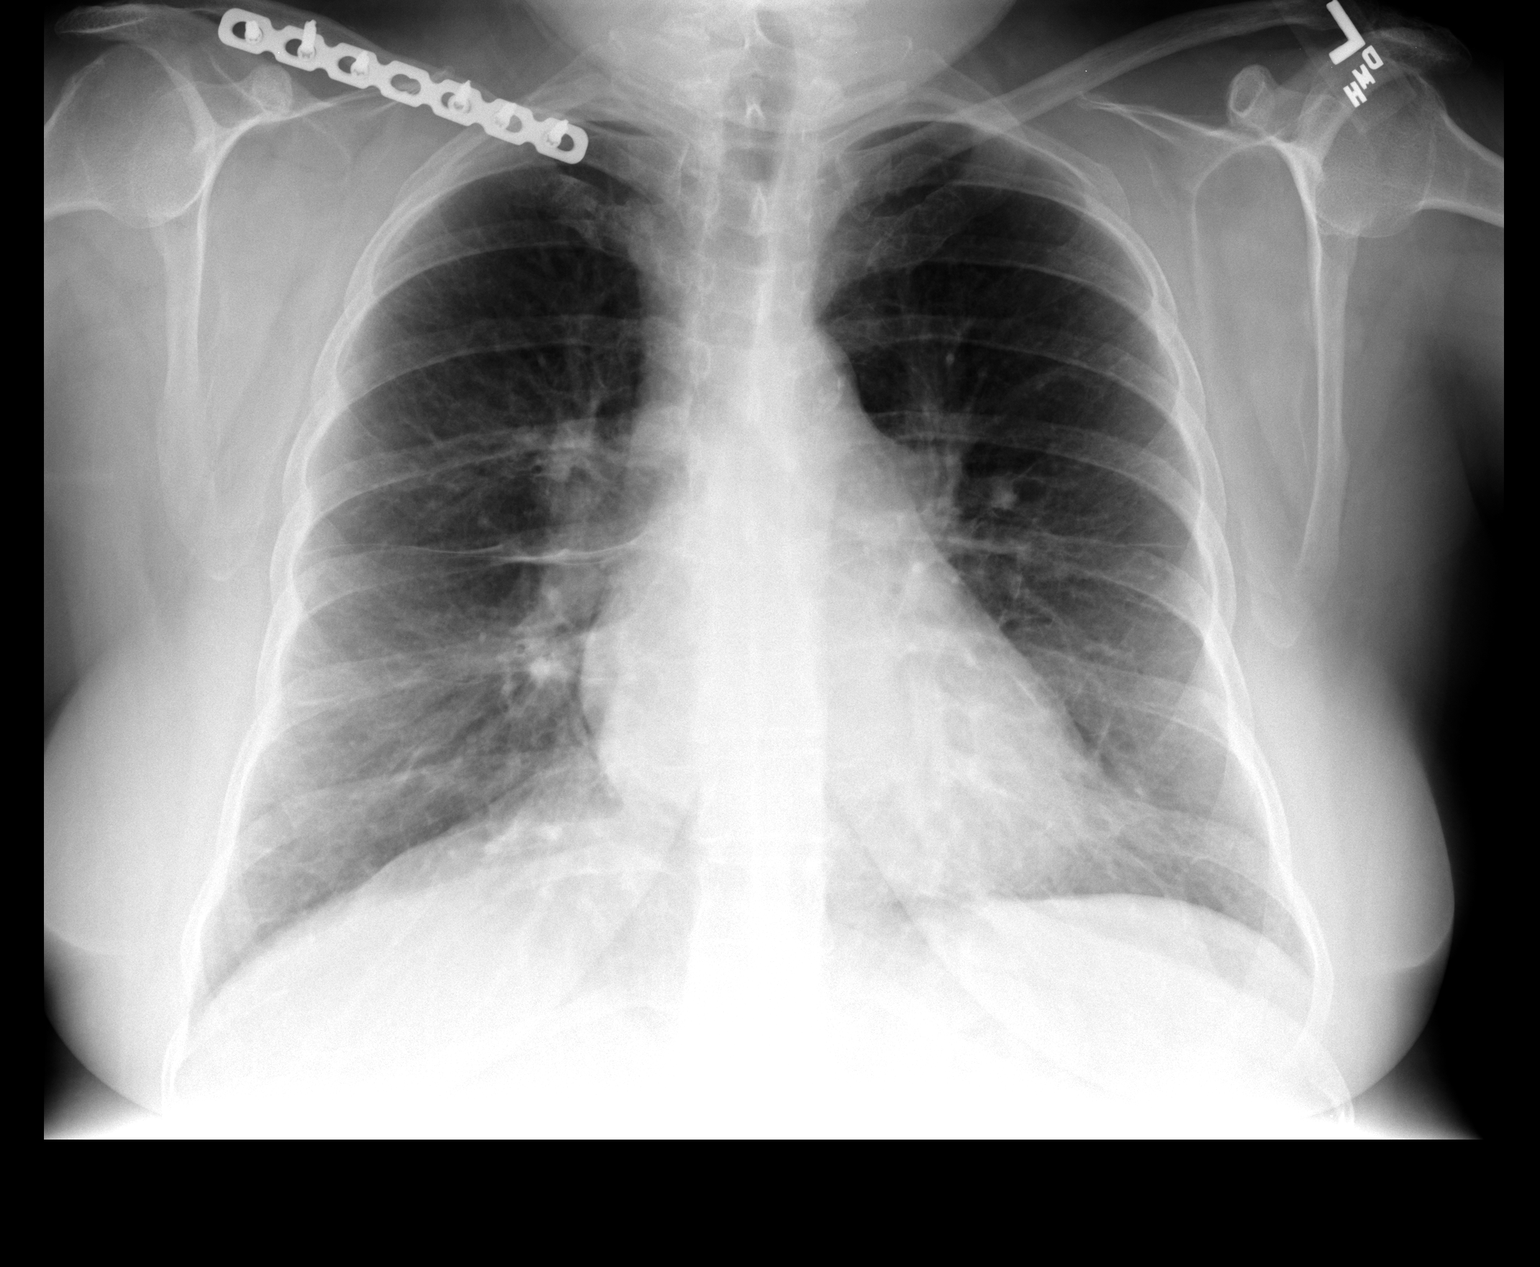

[view not recorded (2 of 2)]
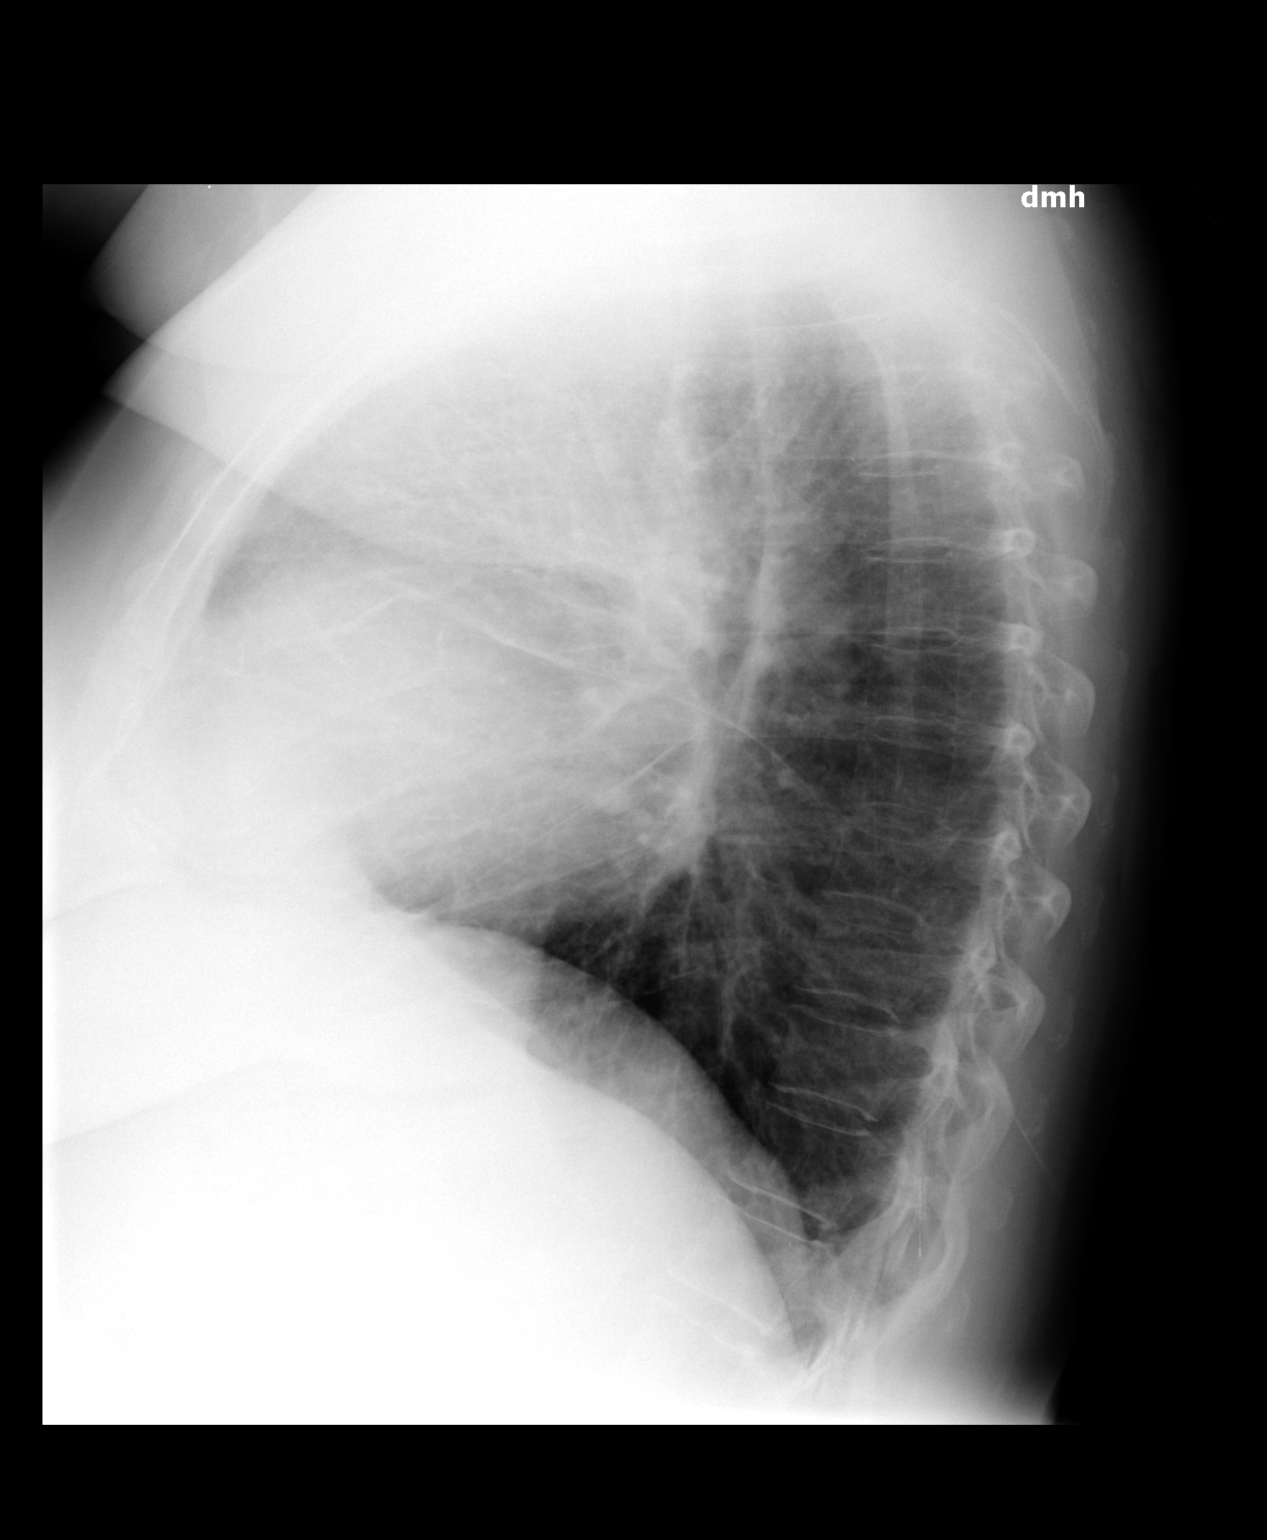

[2 of 2 positions shown; findings below may reference images not displayed]

FINDINGS: The heart size is normal.   There is abnormal interstitial density. The findings could be due to interstitial pulmonary edema or interstitial pneumonitis.  There is no dense consolidation or collapse.  No measurable pleural effusion.
IMPRESSION: Interstitial edema, most commonly seen in the setting of congestive heart failure.  A second explanation could be that of interstitial pneumonitis without consolidation or collapse.

## 2008-04-18 ENCOUNTER — Ambulatory Visit: Payer: Self-pay | Admitting: *Deleted

## 2008-04-18 ENCOUNTER — Encounter: Payer: Self-pay | Admitting: Licensed Clinical Social Worker

## 2008-04-19 LAB — CONVERTED CEMR LAB
ALT: 26 units/L (ref 0–35)
AST: 23 units/L (ref 0–37)
Albumin: 4.4 g/dL (ref 3.5–5.2)
Alkaline Phosphatase: 43 units/L (ref 39–117)
BUN: 20 mg/dL (ref 6–23)
CO2: 24 meq/L (ref 19–32)
Calcium: 9.3 mg/dL (ref 8.4–10.5)
Chloride: 103 meq/L (ref 96–112)
Cholesterol: 127 mg/dL (ref 0–200)
Creatinine, Ser: 0.77 mg/dL (ref 0.40–1.20)
Free T4: 1 ng/dL (ref 0.89–1.80)
Glucose, Bld: 201 mg/dL — ABNORMAL HIGH (ref 70–99)
HCT: 42.3 % (ref 36.0–46.0)
HDL: 32 mg/dL — ABNORMAL LOW (ref >39)
Hemoglobin: 14 g/dL (ref 12.0–15.0)
LDL Cholesterol: 51 mg/dL (ref 0–99)
MCHC: 33.1 g/dL (ref 30.0–36.0)
MCV: 95.1 fL (ref 78.0–100.0)
Platelets: 204 10*3/uL (ref 150–400)
Potassium: 3.8 meq/L (ref 3.5–5.3)
RBC: 4.45 M/uL (ref 3.87–5.11)
RDW: 13.3 % (ref 11.5–15.5)
Sed Rate: 8 mm/hr (ref 0–22)
Sodium: 141 meq/L (ref 135–145)
TSH: 0.997 microintl units/mL (ref 0.350–4.500)
Total Bilirubin: 0.3 mg/dL (ref 0.3–1.2)
Total CHOL/HDL Ratio: 4
Total Protein: 6.7 g/dL (ref 6.0–8.3)
Triglycerides: 222 mg/dL — ABNORMAL HIGH (ref <150)
VLDL: 44 mg/dL — ABNORMAL HIGH (ref 0–40)
Vitamin B-12: 330 pg/mL (ref 211–911)
WBC: 9.2 10*3/uL (ref 4.0–10.5)

## 2008-04-24 ENCOUNTER — Encounter (INDEPENDENT_AMBULATORY_CARE_PROVIDER_SITE_OTHER): Payer: Self-pay | Admitting: *Deleted

## 2008-05-01 ENCOUNTER — Telehealth (INDEPENDENT_AMBULATORY_CARE_PROVIDER_SITE_OTHER): Payer: Self-pay | Admitting: *Deleted

## 2008-05-01 ENCOUNTER — Ambulatory Visit: Payer: Self-pay | Admitting: Internal Medicine

## 2008-05-01 ENCOUNTER — Encounter (INDEPENDENT_AMBULATORY_CARE_PROVIDER_SITE_OTHER): Payer: Self-pay | Admitting: *Deleted

## 2008-05-01 LAB — CONVERTED CEMR LAB: Blood Glucose, Home Monitor: 1 mg/dL

## 2008-05-15 ENCOUNTER — Telehealth: Payer: Self-pay | Admitting: *Deleted

## 2008-05-23 ENCOUNTER — Telehealth (INDEPENDENT_AMBULATORY_CARE_PROVIDER_SITE_OTHER): Payer: Self-pay | Admitting: *Deleted

## 2008-05-24 ENCOUNTER — Telehealth: Payer: Self-pay | Admitting: Internal Medicine

## 2008-05-31 ENCOUNTER — Telehealth (INDEPENDENT_AMBULATORY_CARE_PROVIDER_SITE_OTHER): Payer: Self-pay | Admitting: *Deleted

## 2008-06-02 ENCOUNTER — Telehealth (INDEPENDENT_AMBULATORY_CARE_PROVIDER_SITE_OTHER): Payer: Self-pay | Admitting: *Deleted

## 2008-06-06 ENCOUNTER — Telehealth: Payer: Self-pay | Admitting: *Deleted

## 2008-06-08 ENCOUNTER — Telehealth: Payer: Self-pay | Admitting: *Deleted

## 2008-06-14 ENCOUNTER — Encounter (INDEPENDENT_AMBULATORY_CARE_PROVIDER_SITE_OTHER): Payer: Self-pay | Admitting: *Deleted

## 2008-06-15 ENCOUNTER — Ambulatory Visit: Payer: Self-pay | Admitting: *Deleted

## 2008-06-15 LAB — CONVERTED CEMR LAB
BUN: 15 mg/dL (ref 6–23)
CO2: 24 meq/L (ref 19–32)
Calcium: 8.9 mg/dL (ref 8.4–10.5)
Chloride: 105 meq/L (ref 96–112)
Creatinine, Ser: 0.77 mg/dL (ref 0.40–1.20)
GFR calc Af Amer: >60 mL/min (ref >60)
GFR calc non Af Amer: >60 mL/min (ref >60)
Glucose, Bld: 141 mg/dL — ABNORMAL HIGH (ref 70–99)
Potassium: 4 meq/L (ref 3.5–5.3)
Sodium: 141 meq/L (ref 135–145)

## 2008-06-19 DIAGNOSIS — I10 Essential (primary) hypertension: Secondary | ICD-10-CM | POA: Insufficient documentation

## 2008-06-19 HISTORY — DX: Essential (primary) hypertension: I10

## 2008-06-20 ENCOUNTER — Ambulatory Visit: Payer: Self-pay | Admitting: Infectious Disease

## 2008-06-20 ENCOUNTER — Ambulatory Visit: Payer: Self-pay | Admitting: *Deleted

## 2008-06-22 ENCOUNTER — Telehealth (INDEPENDENT_AMBULATORY_CARE_PROVIDER_SITE_OTHER): Payer: Self-pay | Admitting: *Deleted

## 2008-06-23 ENCOUNTER — Telehealth: Payer: Self-pay | Admitting: *Deleted

## 2008-06-26 LAB — CONVERTED CEMR LAB: 5-HIAA, 24 Hr Urine: 6.3 mg/(24.h) — ABNORMAL HIGH (ref <=6.0)

## 2008-07-03 ENCOUNTER — Telehealth (INDEPENDENT_AMBULATORY_CARE_PROVIDER_SITE_OTHER): Payer: Self-pay | Admitting: *Deleted

## 2008-07-06 ENCOUNTER — Telehealth (INDEPENDENT_AMBULATORY_CARE_PROVIDER_SITE_OTHER): Payer: Self-pay | Admitting: *Deleted

## 2008-08-09 ENCOUNTER — Telehealth (INDEPENDENT_AMBULATORY_CARE_PROVIDER_SITE_OTHER): Payer: Self-pay | Admitting: Internal Medicine

## 2008-08-15 ENCOUNTER — Telehealth: Payer: Self-pay | Admitting: Internal Medicine

## 2008-08-21 ENCOUNTER — Telehealth: Payer: Self-pay | Admitting: Internal Medicine

## 2008-08-29 ENCOUNTER — Telehealth: Payer: Self-pay | Admitting: Internal Medicine

## 2008-09-19 ENCOUNTER — Encounter: Payer: Self-pay | Admitting: Internal Medicine

## 2008-09-22 ENCOUNTER — Telehealth: Payer: Self-pay | Admitting: Internal Medicine

## 2008-10-17 ENCOUNTER — Telehealth: Payer: Self-pay | Admitting: Internal Medicine

## 2008-10-23 IMAGING — US US TRANSVAGINAL NON-OB
1 series · 14 of 25 positions shown · non-contrast
Comparison: none

CLINICAL DATA: Blood in stool.  
 TRANSABDOMINAL AND TRANSVAGINAL PELVIC ULTRASOUND:
TECHNIQUE: Both transabdominal and transvaginal ultrasound examinations of the pelvis were performed including evaluation of the uterus, ovaries, adnexal regions, and pelvic cul-de-sac.

[Series 1: unknown · 0.26mm/px · 14 of 68 slices shown]
[im 1/68]
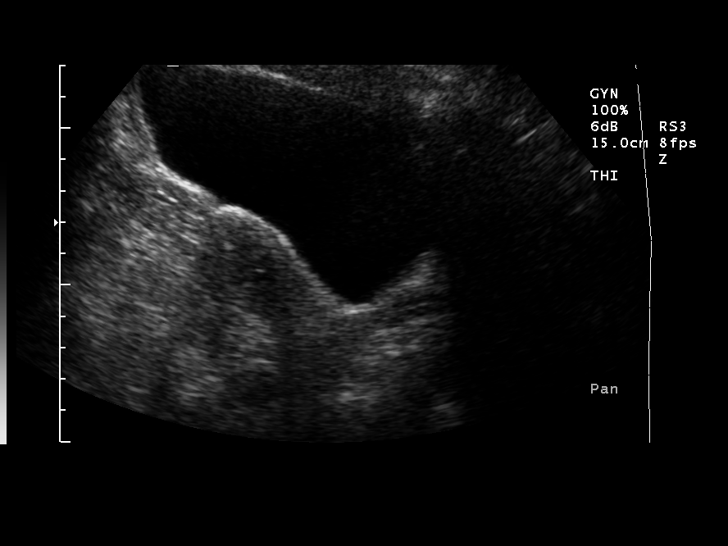
[im 6/68]
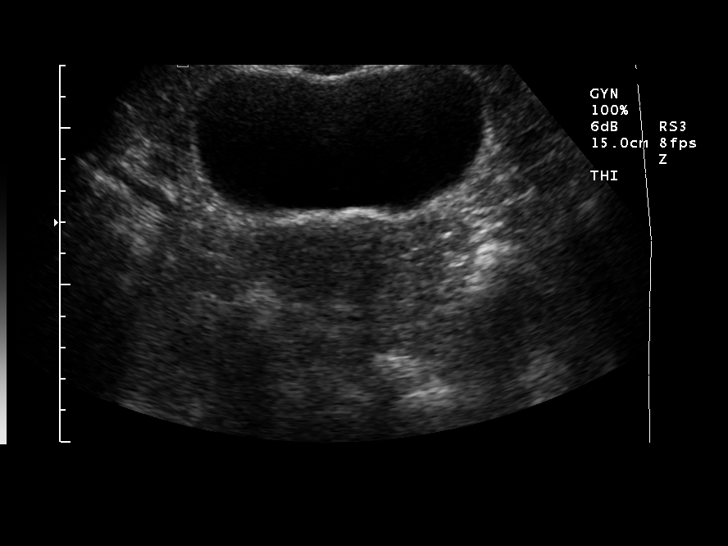
[im 12/68]
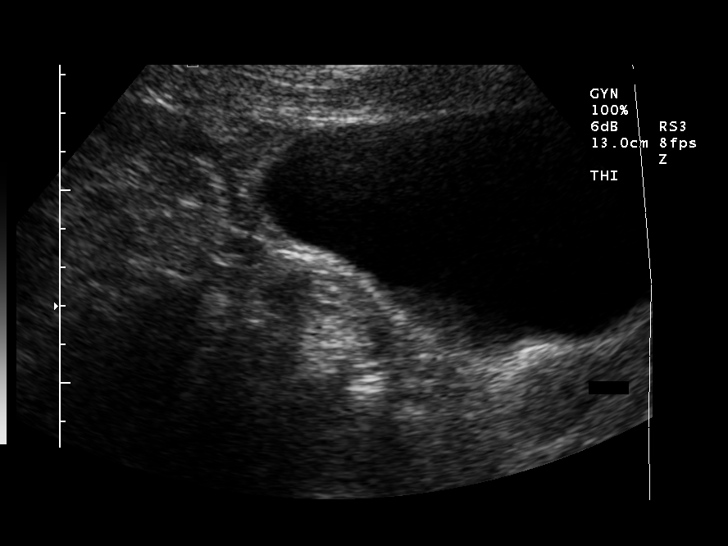
[im 17/68]
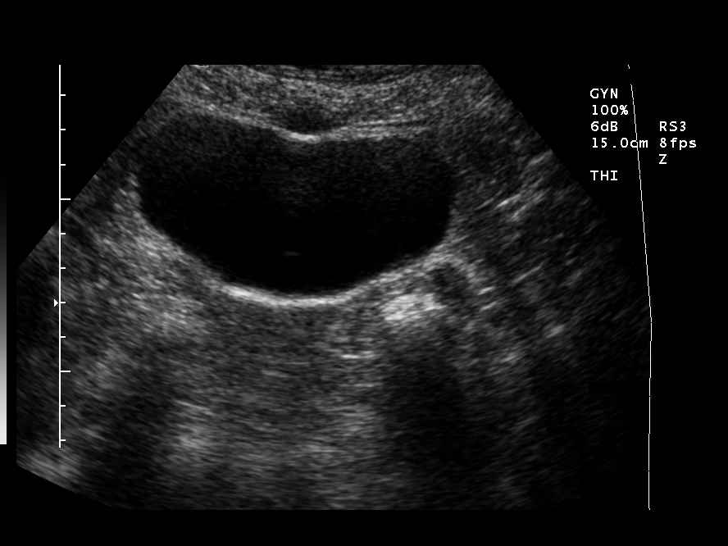
[im 23/68]
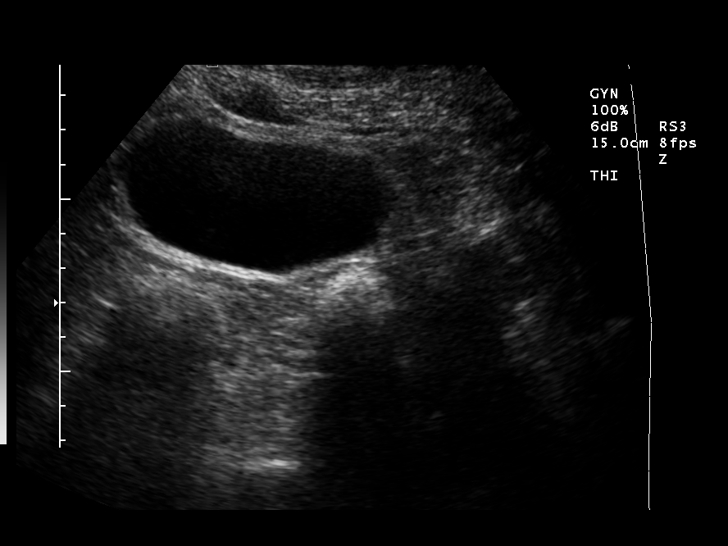
[im 26/68]
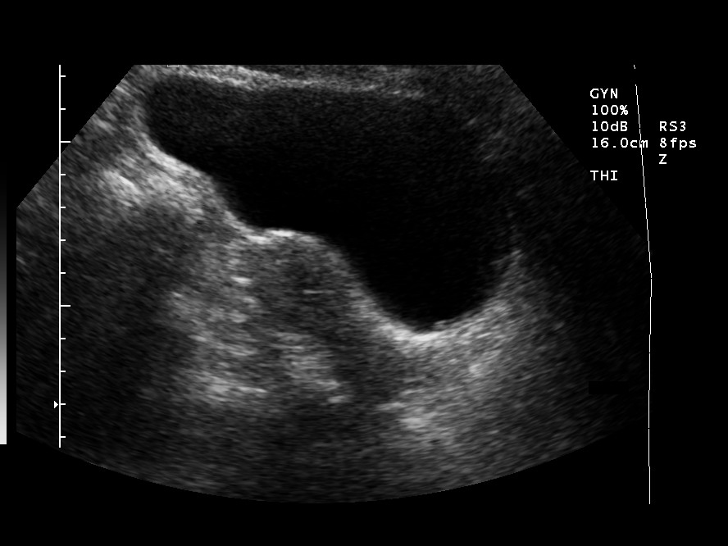
[im 31/68]
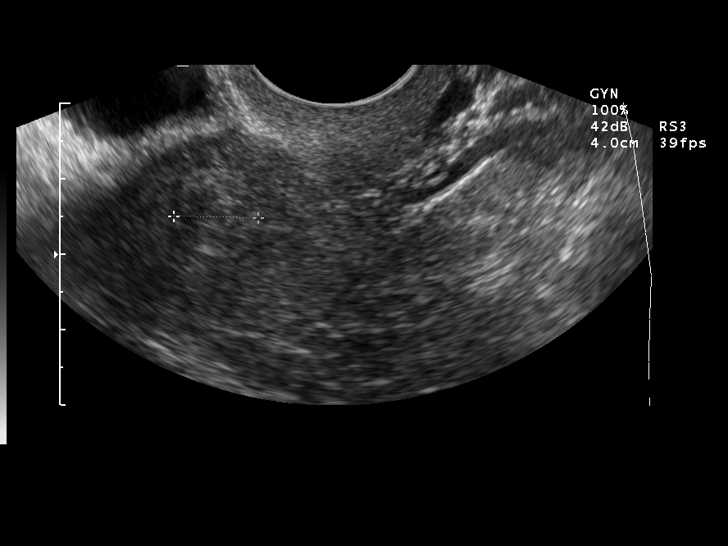
[im 37/68]
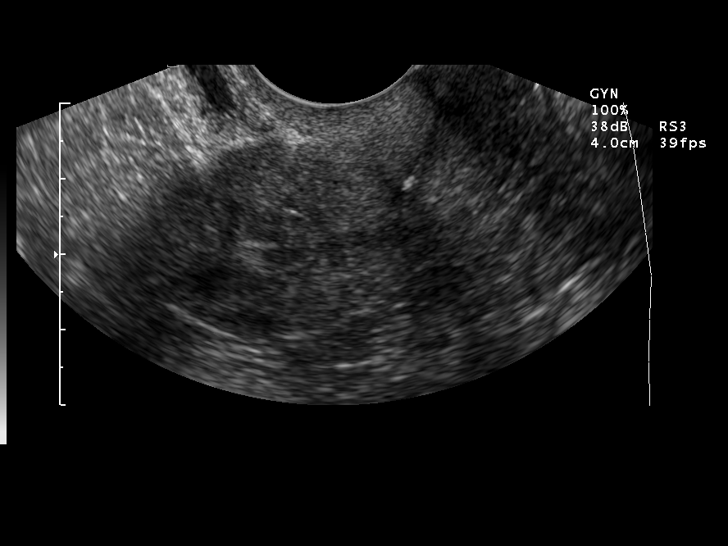
[im 42/68]
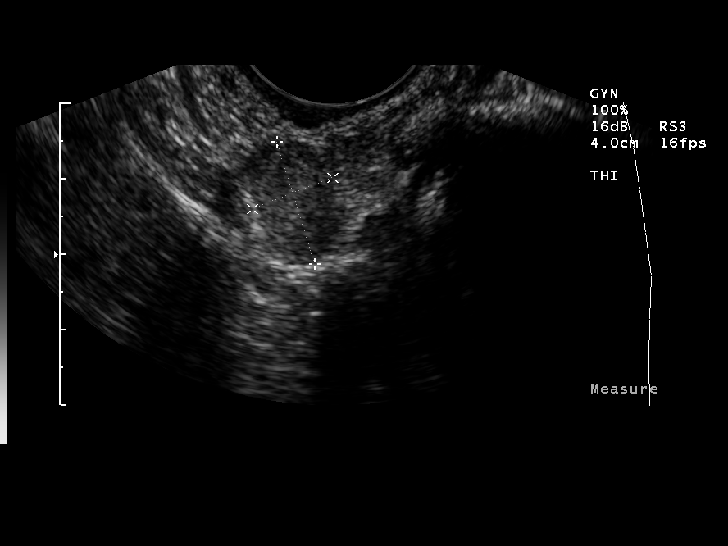
[im 45/68]
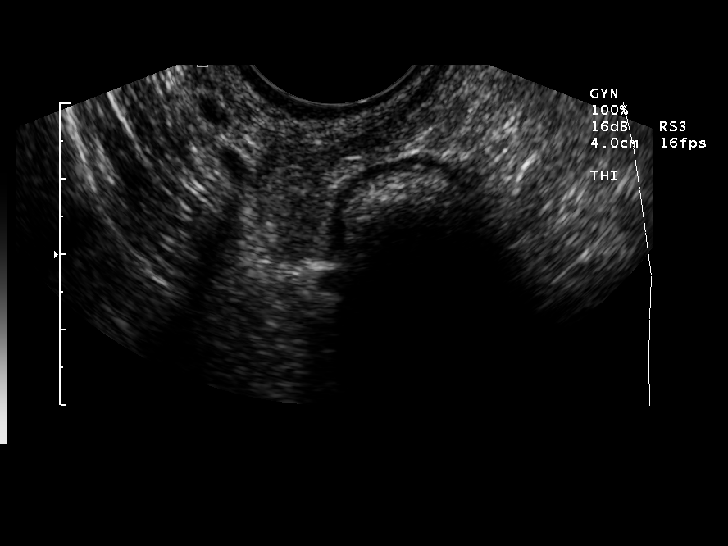
[im 51/68]
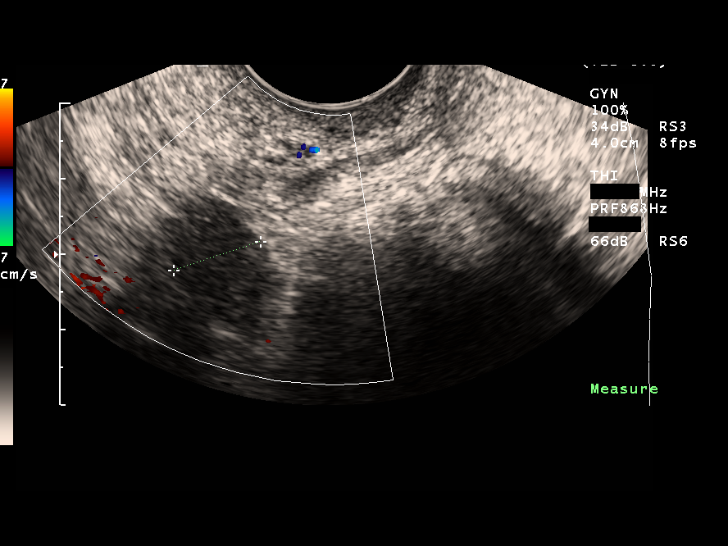
[im 56/68]
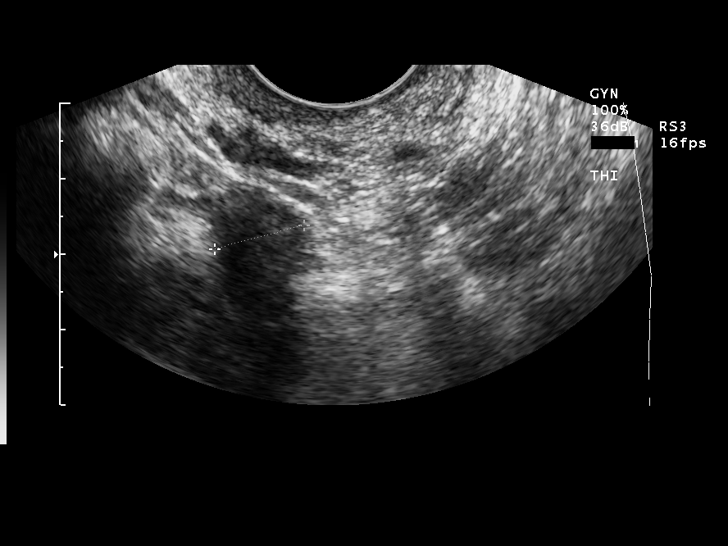
[im 62/68]
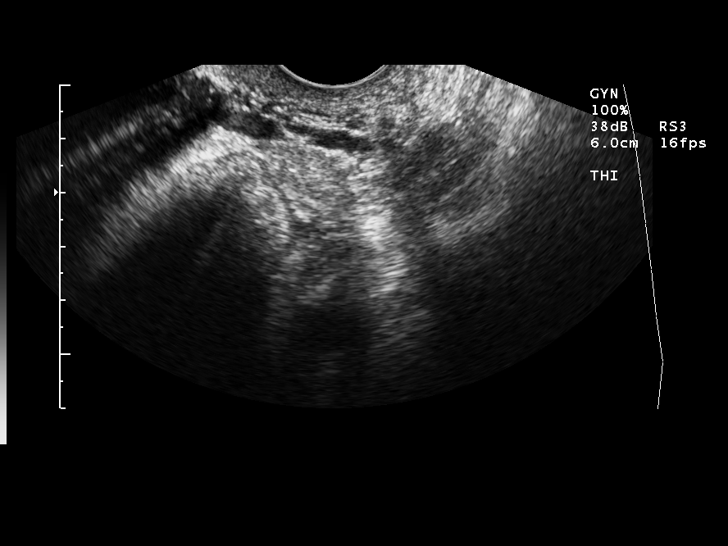
[im 68/68]
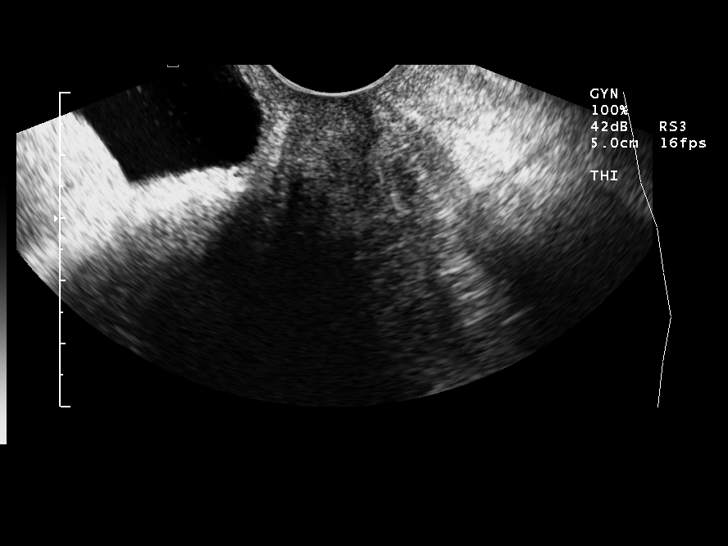

[14 of 25 positions shown; findings below may reference images not displayed]

FINDINGS: Uterus is normal in size measuring 5.3 x 2.9 x 3.4 cm in size.  There is a small (1.2 x .9 x 1.1 cm in size) uterine fibroid noted within the fundal portion of the uterus.  Endometrial stripe thickness measures 3 mm.  The right and left ovaries are normal in appearance with the right ovary measuring 1.8 x 1.2 x 1.2 cm in size and the left ovary 1.7 x 1.0 x 1.2 cm in size.  There is no evidence for an adnexal mass or cyst and there is no free pelvic fluid.
IMPRESSION: Small (1.2 cm) uterine fibroid.  Otherwise normal study.

## 2008-11-15 ENCOUNTER — Ambulatory Visit: Payer: Self-pay | Admitting: Internal Medicine

## 2008-11-15 ENCOUNTER — Telehealth: Payer: Self-pay | Admitting: *Deleted

## 2008-11-15 ENCOUNTER — Encounter: Payer: Self-pay | Admitting: Internal Medicine

## 2008-11-15 DIAGNOSIS — K089 Disorder of teeth and supporting structures, unspecified: Secondary | ICD-10-CM | POA: Insufficient documentation

## 2008-11-15 LAB — CONVERTED CEMR LAB
Bilirubin Urine: NEGATIVE
Glucose, Urine, Semiquant: NEGATIVE
Ketones, urine, test strip: NEGATIVE
Nitrite: NEGATIVE
Specific Gravity, Urine: 1.025
Urobilinogen, UA: 1
WBC Urine, dipstick: NEGATIVE
pH: 5.5

## 2008-11-16 ENCOUNTER — Telehealth: Payer: Self-pay | Admitting: Internal Medicine

## 2008-11-16 DIAGNOSIS — R809 Proteinuria, unspecified: Secondary | ICD-10-CM | POA: Insufficient documentation

## 2008-11-16 LAB — CONVERTED CEMR LAB
Cholesterol: 108 mg/dL (ref 0–200)
Creatinine, Urine: 86.3 mg/dL
HDL: 38 mg/dL — ABNORMAL LOW (ref >39)
LDL Cholesterol: 46 mg/dL (ref 0–99)
Microalb Creat Ratio: 39.3 mg/g — ABNORMAL HIGH (ref 0.0–30.0)
Microalb, Ur: 3.39 mg/dL — ABNORMAL HIGH (ref 0.00–1.89)
Total CHOL/HDL Ratio: 2.8
Triglycerides: 122 mg/dL (ref <150)
VLDL: 24 mg/dL (ref 0–40)

## 2008-11-17 ENCOUNTER — Telehealth: Payer: Self-pay | Admitting: *Deleted

## 2008-11-30 ENCOUNTER — Encounter: Payer: Self-pay | Admitting: Internal Medicine

## 2008-11-30 ENCOUNTER — Ambulatory Visit: Payer: Self-pay | Admitting: Internal Medicine

## 2008-11-30 LAB — CONVERTED CEMR LAB

## 2008-12-20 ENCOUNTER — Telehealth: Payer: Self-pay | Admitting: *Deleted

## 2008-12-21 ENCOUNTER — Telehealth: Payer: Self-pay | Admitting: *Deleted

## 2009-02-16 ENCOUNTER — Encounter: Payer: Self-pay | Admitting: Internal Medicine

## 2009-02-26 ENCOUNTER — Telehealth: Payer: Self-pay | Admitting: *Deleted

## 2009-02-26 ENCOUNTER — Telehealth: Payer: Self-pay | Admitting: Internal Medicine

## 2009-02-27 ENCOUNTER — Telehealth: Payer: Self-pay | Admitting: Internal Medicine

## 2009-03-11 IMAGING — CR DG CHEST 2V
2 series · 2 of 2 positions shown · non-contrast
Comparison: 12/04/05.

CLINICAL DATA: Cough and sore throat.
 CHEST - 2 VIEW - 11/03/06:

[view not recorded (1 of 2)]
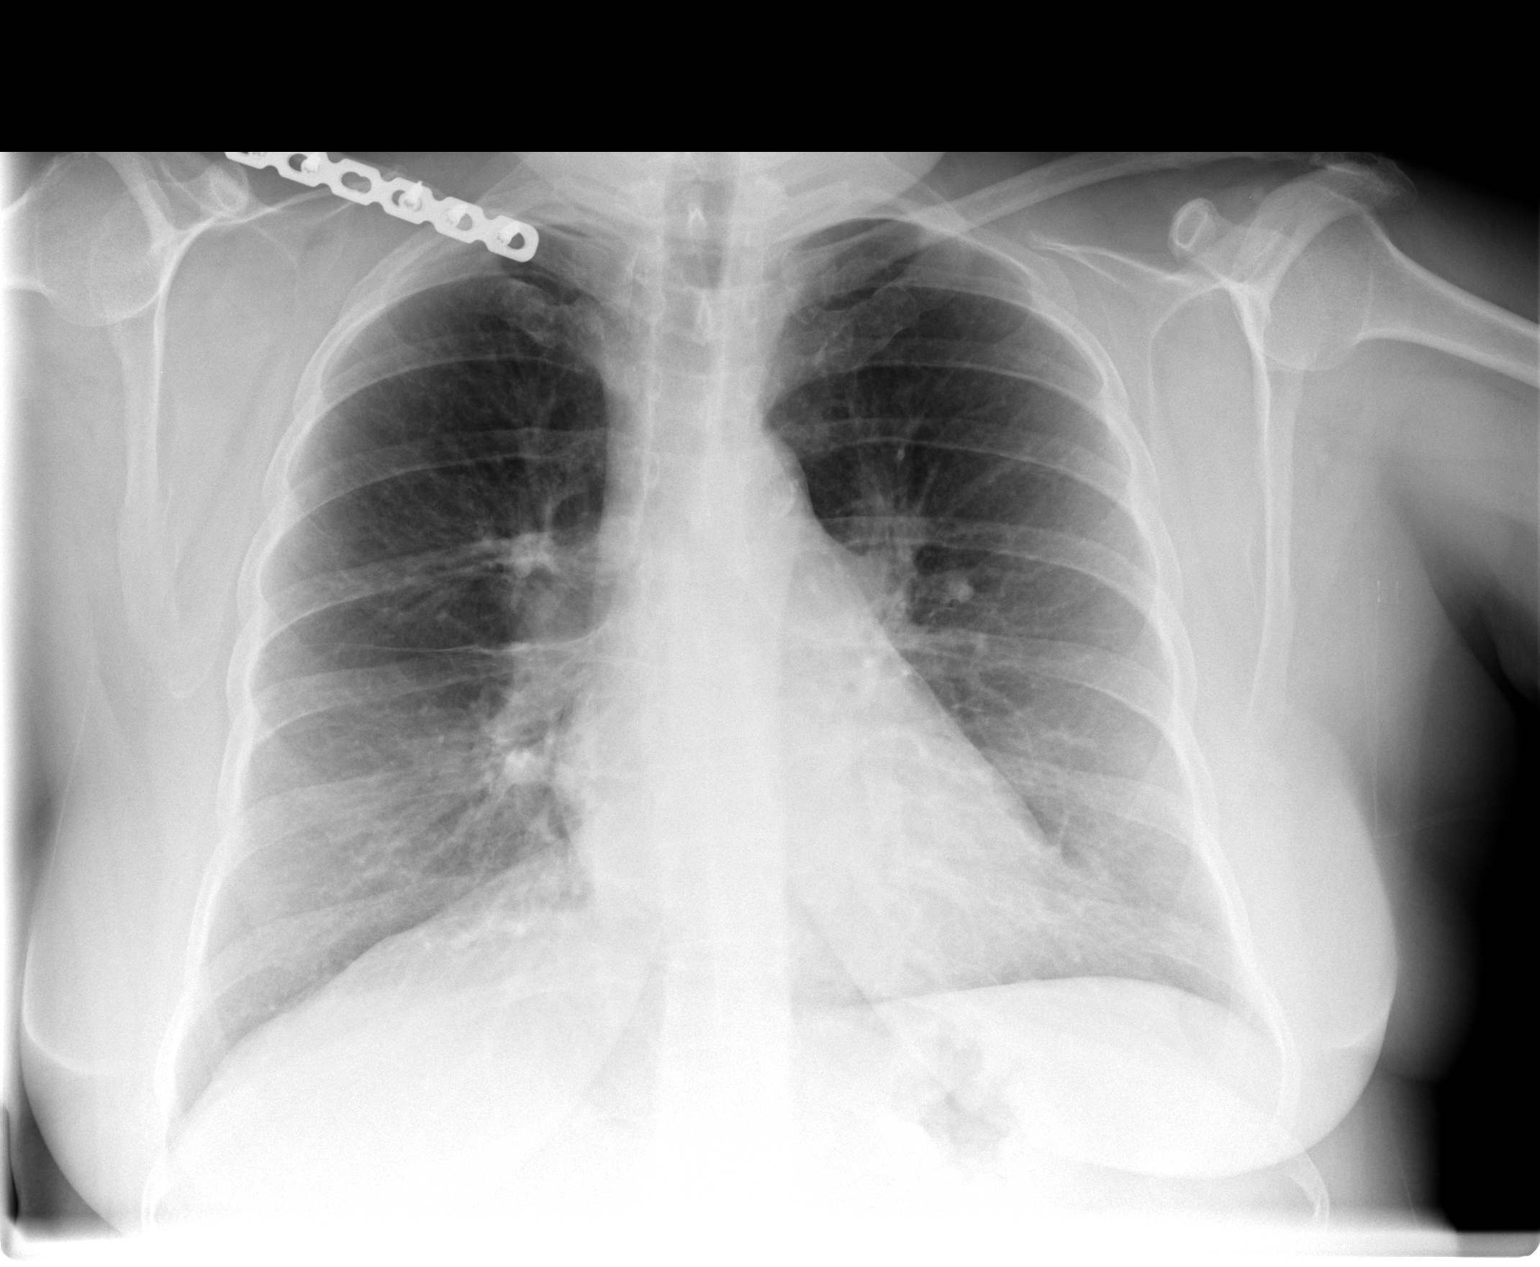

[view not recorded (2 of 2)]
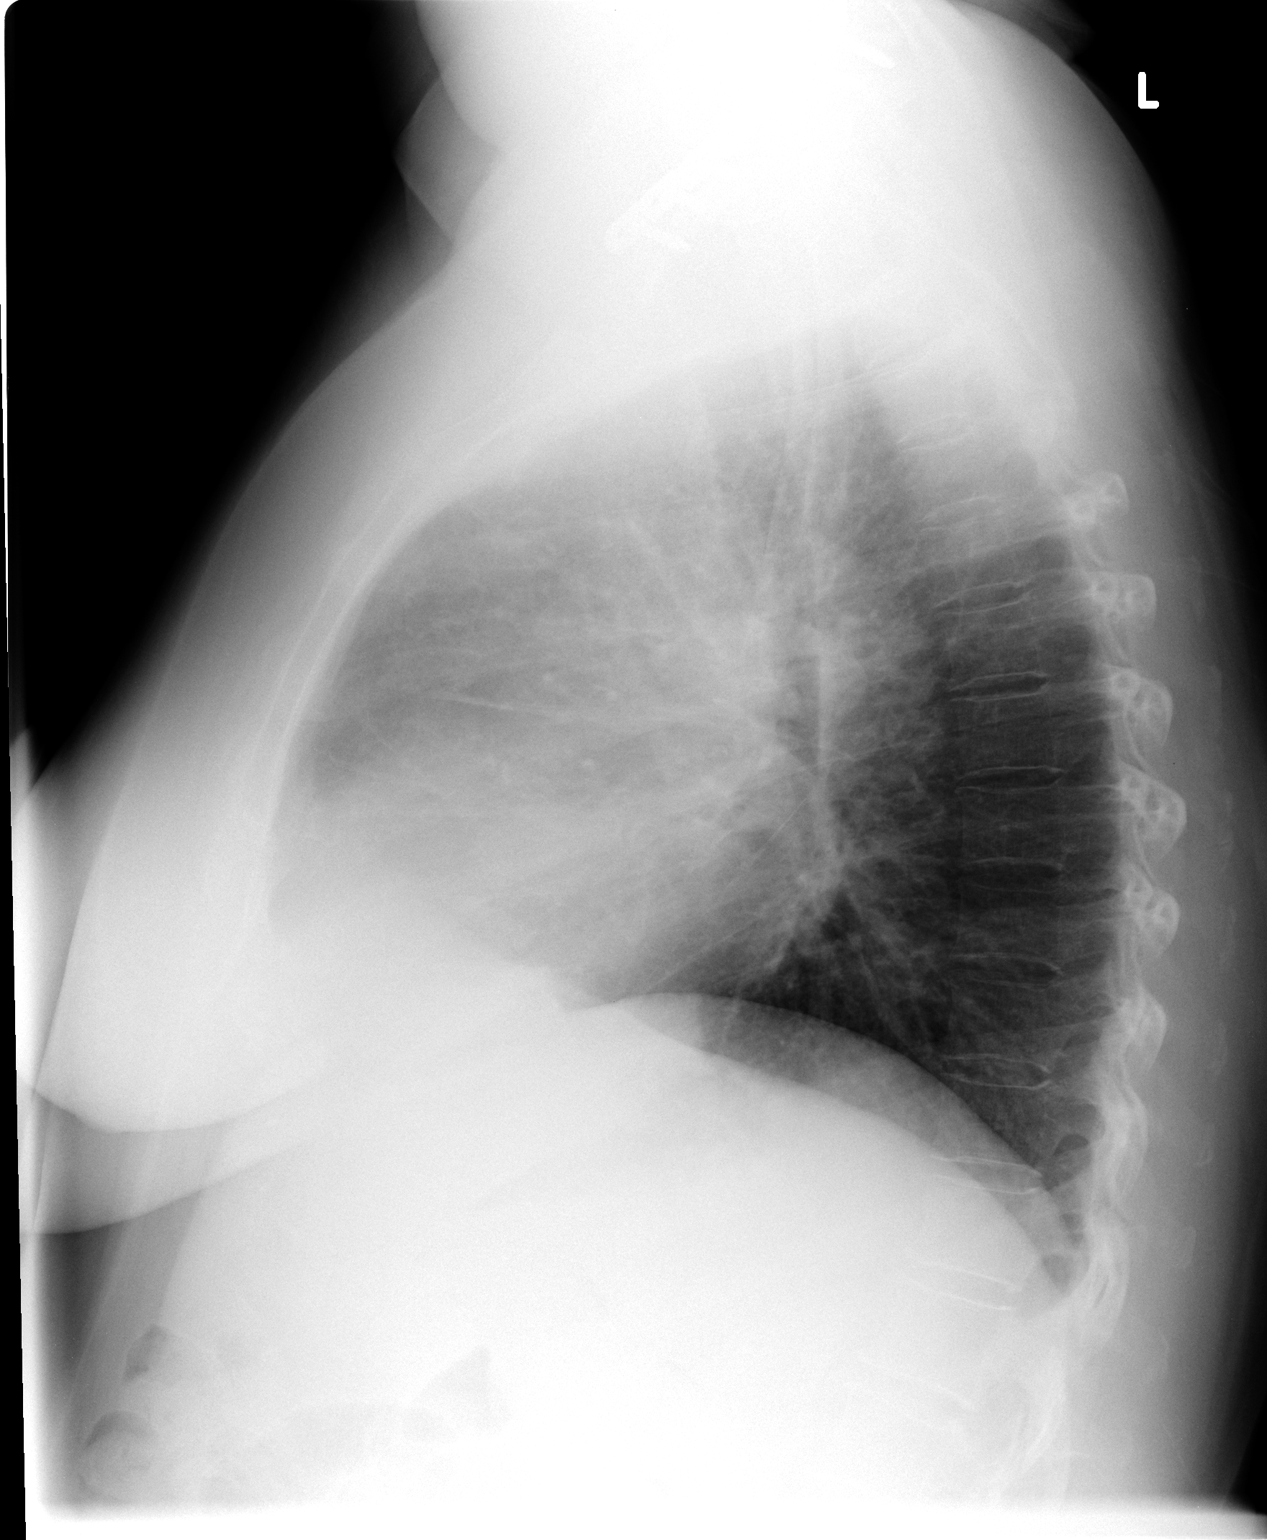

[2 of 2 positions shown; findings below may reference images not displayed]

FINDINGS: The cardiomediastinal silhouette is unremarkable.   Peribronchial thickening is identified and unchanged.  There is no evidence of focal air space disease, pleural effusions, or pneumothorax.  Stable elevation of the right hemidiaphragm is noted.  Internal plate and screw fixation of the right clavicle is unchanged.
IMPRESSION: 1.  No evidence of acute cardiopulmonary disease.
 2.  Stable peribronchial thickening.

## 2009-03-22 ENCOUNTER — Ambulatory Visit: Payer: Self-pay | Admitting: Internal Medicine

## 2009-03-22 LAB — CONVERTED CEMR LAB
ALT: 15 units/L (ref 0–35)
AST: 13 units/L (ref 0–37)
Albumin: 4.2 g/dL (ref 3.5–5.2)
Alkaline Phosphatase: 44 units/L (ref 39–117)
BUN: 15 mg/dL (ref 6–23)
Blood Glucose, Fingerstick: 132
CO2: 27 meq/L (ref 19–32)
Calcium: 9.6 mg/dL (ref 8.4–10.5)
Chloride: 102 meq/L (ref 96–112)
Cholesterol: 111 mg/dL (ref 0–200)
Creatinine, Ser: 0.85 mg/dL (ref 0.40–1.20)
Glucose, Bld: 97 mg/dL (ref 70–99)
HDL: 34 mg/dL — ABNORMAL LOW (ref >39)
Hgb A1c MFr Bld: 6.1 %
LDL Cholesterol: 45 mg/dL (ref 0–99)
Potassium: 4.4 meq/L (ref 3.5–5.3)
Sodium: 142 meq/L (ref 135–145)
Total Bilirubin: 0.4 mg/dL (ref 0.3–1.2)
Total CHOL/HDL Ratio: 3.3
Total Protein: 6.8 g/dL (ref 6.0–8.3)
Triglycerides: 159 mg/dL — ABNORMAL HIGH (ref <150)
VLDL: 32 mg/dL (ref 0–40)

## 2009-03-26 ENCOUNTER — Inpatient Hospital Stay (HOSPITAL_COMMUNITY): Admission: EM | Admit: 2009-03-26 | Discharge: 2009-03-28 | Payer: Self-pay | Admitting: Internal Medicine

## 2009-03-26 ENCOUNTER — Ambulatory Visit: Payer: Self-pay | Admitting: Internal Medicine

## 2009-03-26 ENCOUNTER — Encounter (INDEPENDENT_AMBULATORY_CARE_PROVIDER_SITE_OTHER): Payer: Self-pay | Admitting: Internal Medicine

## 2009-03-26 LAB — CONVERTED CEMR LAB: Blood Glucose, Fingerstick: 108

## 2009-03-27 ENCOUNTER — Telehealth: Payer: Self-pay | Admitting: Internal Medicine

## 2009-03-27 ENCOUNTER — Encounter (INDEPENDENT_AMBULATORY_CARE_PROVIDER_SITE_OTHER): Payer: Self-pay | Admitting: Internal Medicine

## 2009-04-18 ENCOUNTER — Telehealth: Payer: Self-pay | Admitting: Internal Medicine

## 2009-05-11 ENCOUNTER — Encounter: Payer: Self-pay | Admitting: Internal Medicine

## 2009-05-24 ENCOUNTER — Telehealth: Payer: Self-pay | Admitting: Internal Medicine

## 2009-05-30 ENCOUNTER — Telehealth: Payer: Self-pay | Admitting: Internal Medicine

## 2009-05-31 ENCOUNTER — Telehealth: Payer: Self-pay | Admitting: *Deleted

## 2009-06-19 ENCOUNTER — Telehealth: Payer: Self-pay | Admitting: Internal Medicine

## 2009-06-20 ENCOUNTER — Ambulatory Visit: Payer: Self-pay | Admitting: Internal Medicine

## 2009-06-20 LAB — CONVERTED CEMR LAB
Blood Glucose, Fingerstick: 88
Blood Glucose, Home Monitor: 2 mg/dL

## 2009-06-25 ENCOUNTER — Telehealth: Payer: Self-pay | Admitting: Internal Medicine

## 2009-07-03 ENCOUNTER — Telehealth: Payer: Self-pay | Admitting: Internal Medicine

## 2009-07-04 ENCOUNTER — Encounter: Payer: Self-pay | Admitting: Internal Medicine

## 2009-07-04 ENCOUNTER — Ambulatory Visit (HOSPITAL_BASED_OUTPATIENT_CLINIC_OR_DEPARTMENT_OTHER): Admission: RE | Admit: 2009-07-04 | Discharge: 2009-07-04 | Payer: Self-pay | Admitting: Internal Medicine

## 2009-07-07 ENCOUNTER — Ambulatory Visit: Payer: Self-pay | Admitting: Internal Medicine

## 2009-07-10 ENCOUNTER — Telehealth: Payer: Self-pay | Admitting: Internal Medicine

## 2009-07-12 ENCOUNTER — Telehealth: Payer: Self-pay | Admitting: Internal Medicine

## 2009-07-25 ENCOUNTER — Ambulatory Visit: Payer: Self-pay | Admitting: Internal Medicine

## 2009-07-31 ENCOUNTER — Telehealth: Payer: Self-pay | Admitting: *Deleted

## 2009-08-05 ENCOUNTER — Encounter: Payer: Self-pay | Admitting: Internal Medicine

## 2009-08-26 ENCOUNTER — Encounter: Payer: Self-pay | Admitting: Internal Medicine

## 2009-08-28 ENCOUNTER — Ambulatory Visit: Payer: Self-pay | Admitting: Internal Medicine

## 2009-08-28 ENCOUNTER — Ambulatory Visit (HOSPITAL_COMMUNITY): Admission: RE | Admit: 2009-08-28 | Discharge: 2009-08-28 | Payer: Self-pay | Admitting: Internal Medicine

## 2009-08-28 ENCOUNTER — Encounter: Payer: Self-pay | Admitting: Internal Medicine

## 2009-08-28 DIAGNOSIS — R1011 Right upper quadrant pain: Secondary | ICD-10-CM | POA: Insufficient documentation

## 2009-08-28 LAB — CONVERTED CEMR LAB
ALT: 12 units/L (ref 0–35)
AST: 14 units/L (ref 0–37)
Albumin: 3.7 g/dL (ref 3.5–5.2)
Alkaline Phosphatase: 38 units/L — ABNORMAL LOW (ref 39–117)
BUN: 15 mg/dL (ref 6–23)
CO2: 29 meq/L (ref 19–32)
Calcium: 9.4 mg/dL (ref 8.4–10.5)
Chloride: 104 meq/L (ref 96–112)
Creatinine, Ser: 0.81 mg/dL (ref 0.40–1.20)
Glucose, Bld: 49 mg/dL — ABNORMAL LOW (ref 70–99)
HCT: 25.1 % — ABNORMAL LOW (ref 36.0–46.0)
Hemoglobin: 8.1 g/dL — ABNORMAL LOW (ref 12.0–15.0)
Lipase: 22 units/L (ref 11–59)
MCHC: 32.3 g/dL (ref 30.0–36.0)
MCV: 77.4 fL — ABNORMAL LOW (ref >=78.0)
Platelets: 240 10*3/uL (ref 150–400)
Potassium: 4.1 meq/L (ref 3.5–5.3)
RBC: 3.25 M/uL — ABNORMAL LOW (ref 3.87–5.11)
RDW: 18.6 % — ABNORMAL HIGH (ref 11.5–15.5)
Sodium: 140 meq/L (ref 135–145)
Total Bilirubin: 0.4 mg/dL (ref 0.3–1.2)
Total CK: 61 units/L (ref 7–177)
Total Protein: 6.4 g/dL (ref 6.0–8.3)
Troponin I: 0.01 ng/mL (ref <0.06)
WBC: 9.1 10*3/uL (ref 4.0–10.5)

## 2009-08-29 ENCOUNTER — Ambulatory Visit (HOSPITAL_COMMUNITY): Admission: RE | Admit: 2009-08-29 | Discharge: 2009-08-29 | Payer: Self-pay | Admitting: Internal Medicine

## 2009-08-29 ENCOUNTER — Telehealth: Payer: Self-pay | Admitting: Internal Medicine

## 2009-08-29 ENCOUNTER — Ambulatory Visit: Payer: Self-pay | Admitting: Internal Medicine

## 2009-08-29 LAB — CONVERTED CEMR LAB: Blood Glucose, Fingerstick: 150

## 2009-09-01 ENCOUNTER — Telehealth: Payer: Self-pay | Admitting: Internal Medicine

## 2009-09-03 ENCOUNTER — Ambulatory Visit: Payer: Self-pay | Admitting: Internal Medicine

## 2009-09-03 ENCOUNTER — Encounter: Payer: Self-pay | Admitting: Internal Medicine

## 2009-09-03 ENCOUNTER — Telehealth: Payer: Self-pay | Admitting: *Deleted

## 2009-09-03 ENCOUNTER — Inpatient Hospital Stay (HOSPITAL_COMMUNITY): Admission: AD | Admit: 2009-09-03 | Discharge: 2009-09-07 | Payer: Self-pay | Admitting: Internal Medicine

## 2009-09-04 ENCOUNTER — Encounter: Payer: Self-pay | Admitting: Licensed Clinical Social Worker

## 2009-09-04 ENCOUNTER — Encounter: Payer: Self-pay | Admitting: Internal Medicine

## 2009-09-05 ENCOUNTER — Telehealth: Payer: Self-pay | Admitting: *Deleted

## 2009-09-07 ENCOUNTER — Encounter: Payer: Self-pay | Admitting: Internal Medicine

## 2009-09-12 ENCOUNTER — Telehealth (INDEPENDENT_AMBULATORY_CARE_PROVIDER_SITE_OTHER): Payer: Self-pay | Admitting: *Deleted

## 2009-09-13 ENCOUNTER — Telehealth (INDEPENDENT_AMBULATORY_CARE_PROVIDER_SITE_OTHER): Payer: Self-pay | Admitting: *Deleted

## 2009-09-14 ENCOUNTER — Ambulatory Visit: Payer: Self-pay | Admitting: Internal Medicine

## 2009-09-14 LAB — HM DIABETES FOOT EXAM: HM Diabetic Foot Exam: NEGATIVE

## 2009-09-14 LAB — CONVERTED CEMR LAB
BUN: 14 mg/dL (ref 6–23)
Blood Glucose, Fingerstick: 139
CO2: 29 meq/L (ref 19–32)
Calcium: 9.3 mg/dL (ref 8.4–10.5)
Chloride: 104 meq/L (ref 96–112)
Creatinine, Ser: 0.8 mg/dL (ref 0.40–1.20)
Glucose, Bld: 145 mg/dL — ABNORMAL HIGH (ref 70–99)
HCT: 35.5 % — ABNORMAL LOW (ref 36.0–46.0)
Hemoglobin: 10.3 g/dL — ABNORMAL LOW (ref 12.0–15.0)
Hgb A1c MFr Bld: 5.6 %
MCHC: 29 g/dL — ABNORMAL LOW (ref 30.0–36.0)
MCV: 87.9 fL (ref >=78.0)
Platelets: 253 10*3/uL (ref 150–400)
Potassium: 4.6 meq/L (ref 3.5–5.3)
RBC: 4.04 M/uL (ref 3.87–5.11)
RDW: 21.6 % — ABNORMAL HIGH (ref 11.5–15.5)
Sodium: 142 meq/L (ref 135–145)
WBC: 8.6 10*3/uL (ref 4.0–10.5)

## 2009-09-19 ENCOUNTER — Telehealth: Payer: Self-pay | Admitting: Internal Medicine

## 2009-10-01 ENCOUNTER — Encounter: Payer: Self-pay | Admitting: Internal Medicine

## 2009-10-02 ENCOUNTER — Encounter: Payer: Self-pay | Admitting: Internal Medicine

## 2009-10-02 ENCOUNTER — Ambulatory Visit: Payer: Self-pay | Admitting: Internal Medicine

## 2009-10-02 LAB — CONVERTED CEMR LAB
BUN: 11 mg/dL (ref 6–23)
Blood Glucose, Fingerstick: 196
CO2: 28 meq/L (ref 19–32)
Calcium: 9.8 mg/dL (ref 8.4–10.5)
Chloride: 102 meq/L (ref 96–112)
Creatinine, Ser: 0.82 mg/dL (ref 0.40–1.20)
Glucose, Bld: 187 mg/dL — ABNORMAL HIGH (ref 70–99)
HCT: 44.2 % (ref 36.0–46.0)
Hemoglobin: 13.6 g/dL (ref 12.0–15.0)
MCHC: 30.8 g/dL (ref 30.0–36.0)
MCV: 88.4 fL (ref >=78.0)
Platelets: 241 10*3/uL (ref 150–400)
Potassium: 4.5 meq/L (ref 3.5–5.3)
RBC: 5 M/uL (ref 3.87–5.11)
RDW: 20.1 % — ABNORMAL HIGH (ref 11.5–15.5)
Sodium: 139 meq/L (ref 135–145)
WBC: 7.8 10*3/uL (ref 4.0–10.5)

## 2009-10-10 ENCOUNTER — Encounter: Payer: Self-pay | Admitting: Internal Medicine

## 2009-10-12 ENCOUNTER — Telehealth: Payer: Self-pay | Admitting: Internal Medicine

## 2009-10-25 ENCOUNTER — Encounter: Payer: Self-pay | Admitting: Internal Medicine

## 2009-11-02 ENCOUNTER — Telehealth: Payer: Self-pay | Admitting: Internal Medicine

## 2009-11-06 ENCOUNTER — Telehealth: Payer: Self-pay | Admitting: *Deleted

## 2009-11-08 ENCOUNTER — Ambulatory Visit: Payer: Self-pay | Admitting: Internal Medicine

## 2009-11-08 ENCOUNTER — Encounter: Payer: Self-pay | Admitting: Internal Medicine

## 2009-11-08 LAB — CONVERTED CEMR LAB: Hgb A1c MFr Bld: 7.3 %

## 2009-11-09 ENCOUNTER — Encounter: Payer: Self-pay | Admitting: Internal Medicine

## 2009-11-09 LAB — CONVERTED CEMR LAB
Blood Glucose, AC Dinner: 195 mg/dL
Blood Glucose, AC Lunch: 207 mg/dL
Creatinine, Urine: 115.2 mg/dL
Microalb Creat Ratio: 13.9 mg/g (ref 0.0–30.0)
Microalb, Ur: 1.6 mg/dL (ref 0.00–1.89)

## 2009-11-14 ENCOUNTER — Telehealth: Payer: Self-pay | Admitting: Internal Medicine

## 2009-11-14 ENCOUNTER — Encounter: Payer: Self-pay | Admitting: Internal Medicine

## 2009-11-19 ENCOUNTER — Encounter: Payer: Self-pay | Admitting: Internal Medicine

## 2009-11-23 ENCOUNTER — Ambulatory Visit: Payer: Self-pay | Admitting: Internal Medicine

## 2009-11-28 ENCOUNTER — Encounter: Payer: Self-pay | Admitting: Internal Medicine

## 2009-12-03 IMAGING — CR DG LUMBAR SPINE COMPLETE 4+V
5 series · 5 of 5 positions shown · non-contrast
Comparison: None

CLINICAL DATA: Low back pain

LUMBAR SPINE - COMPLETE 4+ VIEW

[t l-spine a.p.]
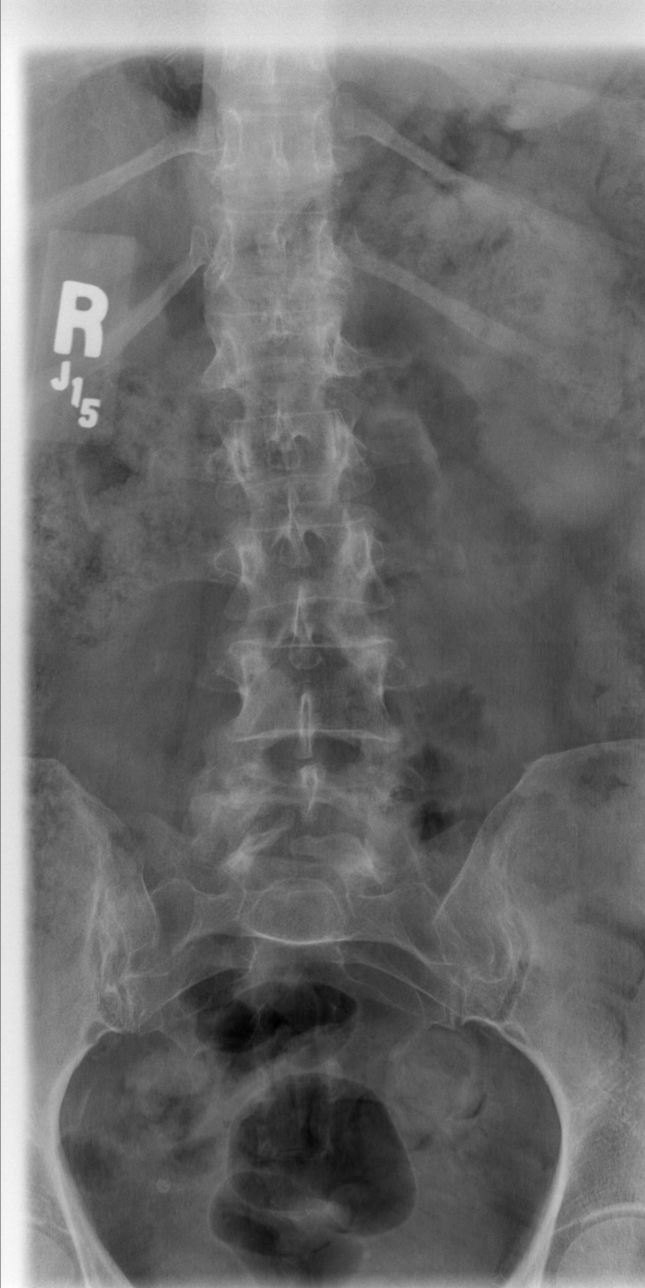

[t l-spine oblique exposure (1 of 2)]
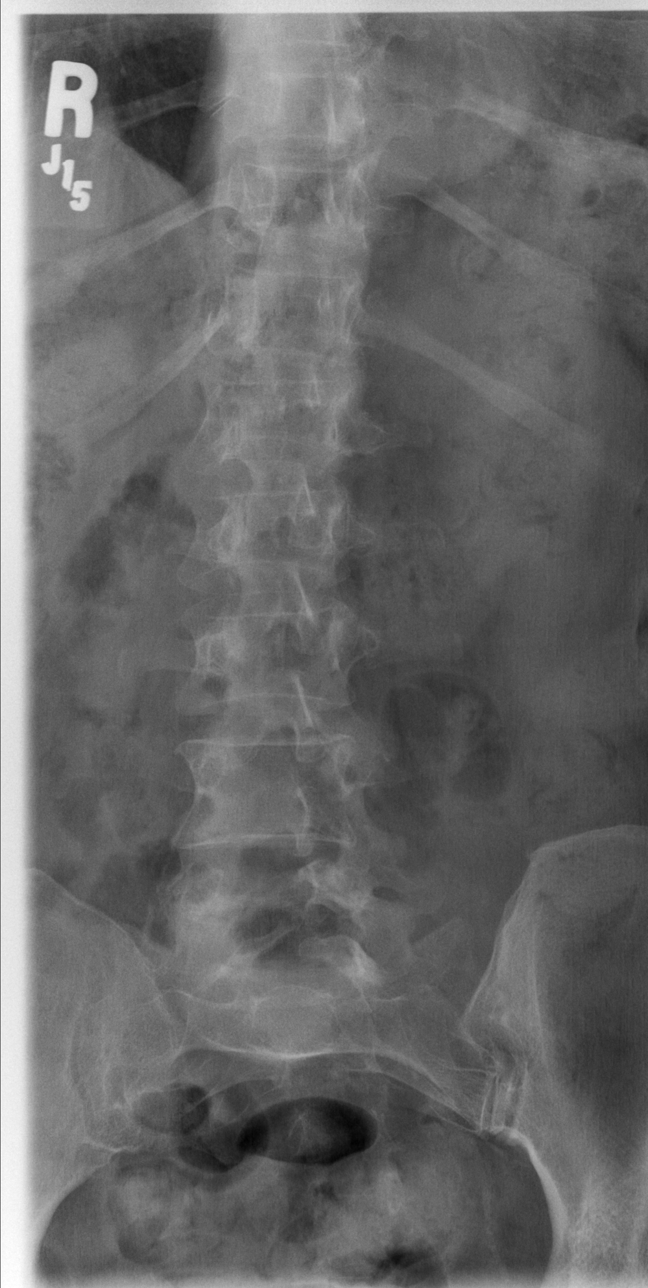

[t l-spine oblique exposure (2 of 2)]
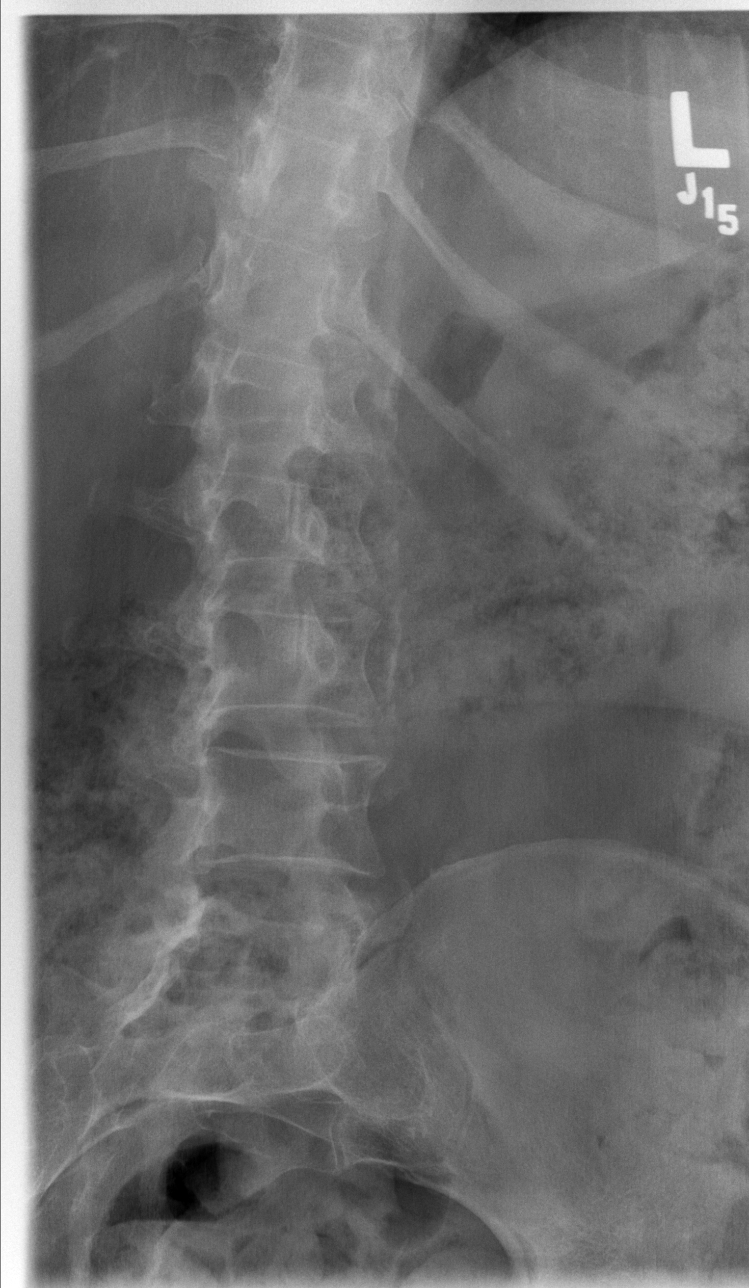

[t l-spine lat]
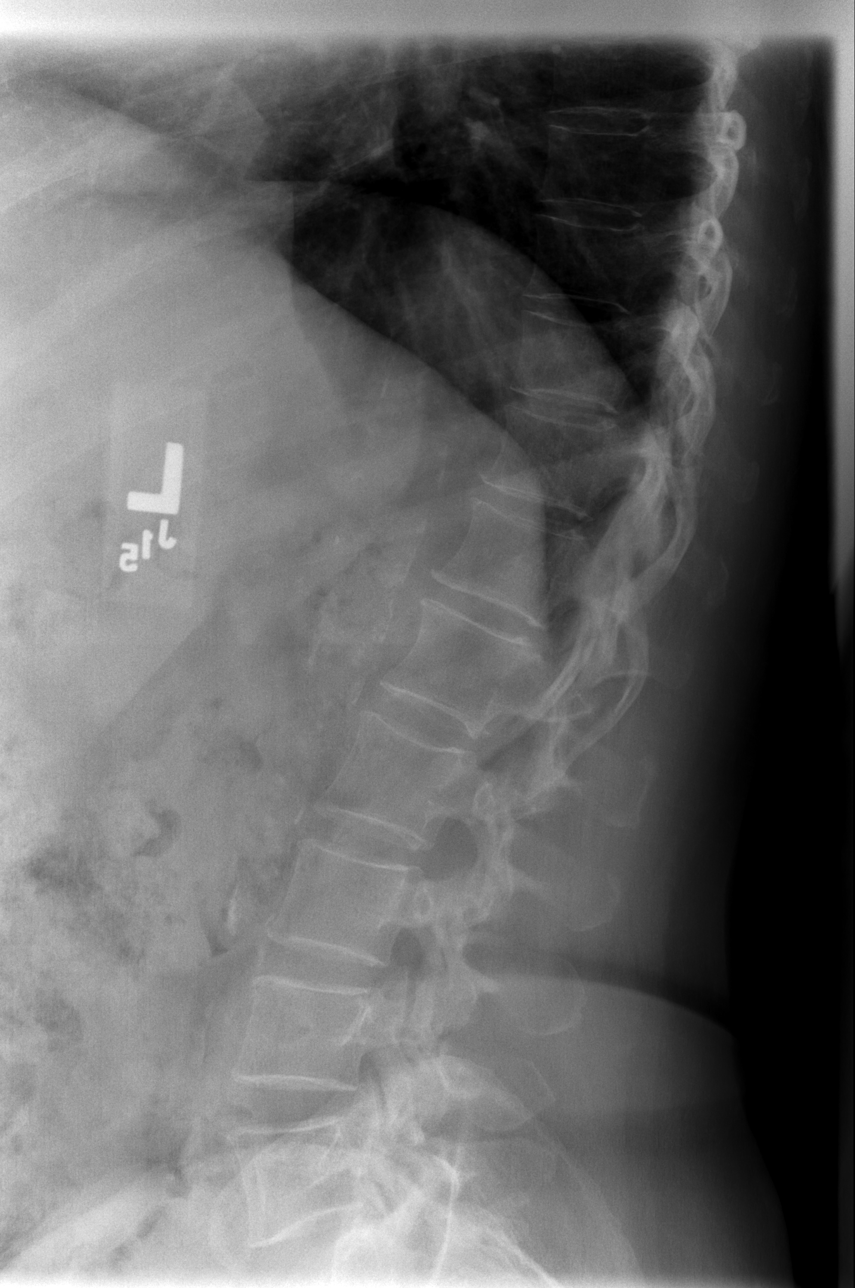

[t l-spine l5-s1 spot]
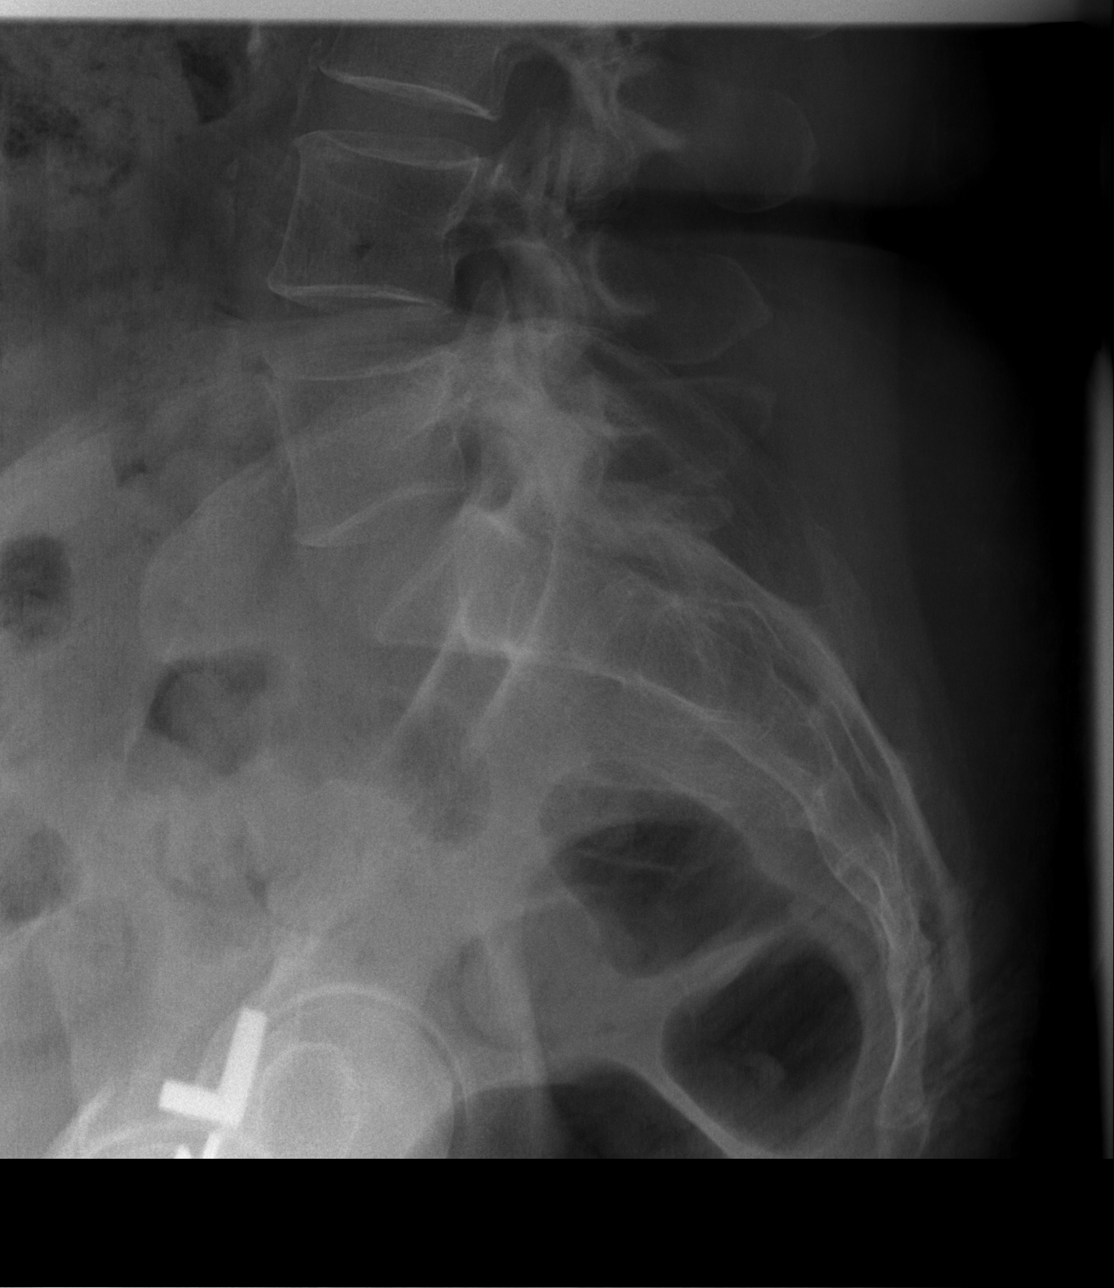

[5 of 5 positions shown; findings below may reference images not displayed]

FINDINGS: On the AP image there is slight scoliosis or splinting
convexity to the left.  There is spina bifida occulta of S1.  There
is a slightly osteopenic appearance of the bones.  No pars defects
are seen.  No fracture or subluxation is evident.  There is minimal
early marginal osteophyte formation representing degenerative
spondylosis.  Nonaneurysmal aortic calcification is seen.
IMPRESSION: Slight scoliosis or splinting convexity to the left.  Spina bifida
occulta of S1, a developmental variant.  Minimal early degenerative
spondylosis.  Nonaneurysmal aortic and arterial calcifications.

## 2009-12-05 ENCOUNTER — Telehealth: Payer: Self-pay | Admitting: Internal Medicine

## 2009-12-07 ENCOUNTER — Encounter: Payer: Self-pay | Admitting: Internal Medicine

## 2009-12-07 LAB — HM COLONOSCOPY

## 2009-12-10 ENCOUNTER — Encounter: Payer: Self-pay | Admitting: Internal Medicine

## 2009-12-12 ENCOUNTER — Telehealth: Payer: Self-pay | Admitting: Internal Medicine

## 2010-01-07 ENCOUNTER — Telehealth: Payer: Self-pay | Admitting: Internal Medicine

## 2010-01-08 ENCOUNTER — Telehealth: Payer: Self-pay | Admitting: Internal Medicine

## 2010-01-09 ENCOUNTER — Encounter: Payer: Self-pay | Admitting: Internal Medicine

## 2010-01-10 ENCOUNTER — Encounter: Payer: Self-pay | Admitting: Internal Medicine

## 2010-01-10 ENCOUNTER — Ambulatory Visit: Payer: Self-pay | Admitting: Internal Medicine

## 2010-02-05 ENCOUNTER — Telehealth: Payer: Self-pay | Admitting: Internal Medicine

## 2010-02-10 DIAGNOSIS — Z953 Presence of xenogenic heart valve: Secondary | ICD-10-CM | POA: Insufficient documentation

## 2010-02-10 HISTORY — DX: Presence of xenogenic heart valve: Z95.3

## 2010-02-14 ENCOUNTER — Ambulatory Visit (HOSPITAL_COMMUNITY)
Admission: RE | Admit: 2010-02-14 | Discharge: 2010-02-14 | Payer: Self-pay | Source: Home / Self Care | Attending: Internal Medicine | Admitting: Internal Medicine

## 2010-02-14 ENCOUNTER — Ambulatory Visit: Admission: RE | Admit: 2010-02-14 | Discharge: 2010-02-14 | Payer: Self-pay | Source: Home / Self Care

## 2010-02-14 LAB — HM PAP SMEAR

## 2010-02-14 LAB — GLUCOSE, CAPILLARY: Glucose-Capillary: 204 mg/dL — ABNORMAL HIGH (ref 70–99)

## 2010-02-14 LAB — CONVERTED CEMR LAB

## 2010-02-14 LAB — HM MAMMOGRAPHY

## 2010-02-18 ENCOUNTER — Encounter: Payer: Self-pay | Admitting: Internal Medicine

## 2010-03-01 ENCOUNTER — Telehealth: Payer: Self-pay | Admitting: Internal Medicine

## 2010-03-03 ENCOUNTER — Encounter: Payer: Self-pay | Admitting: Internal Medicine

## 2010-03-08 ENCOUNTER — Ambulatory Visit: Admission: RE | Admit: 2010-03-08 | Discharge: 2010-03-08 | Payer: Self-pay | Source: Home / Self Care

## 2010-03-08 DIAGNOSIS — R3915 Urgency of urination: Secondary | ICD-10-CM | POA: Insufficient documentation

## 2010-03-08 LAB — CONVERTED CEMR LAB
Bacteria, UA: NONE SEEN
Bilirubin Urine: NEGATIVE
Blood, UA: NEGATIVE
Casts: NONE SEEN /lpf
Crystals: NONE SEEN
Ketones, ur: NEGATIVE mg/dL
Nitrite: NEGATIVE
Protein, ur: NEGATIVE mg/dL
Specific Gravity, Urine: 1.013 (ref 1.005–1.03)
Urine Glucose: 100 mg/dL — AB
Urobilinogen, UA: 1 (ref 0.0–1.0)
pH: 6.5 (ref 5.0–8.0)

## 2010-03-08 LAB — GLUCOSE, CAPILLARY: Glucose-Capillary: 263 mg/dL — ABNORMAL HIGH (ref 70–99)

## 2010-03-12 ENCOUNTER — Telehealth: Payer: Self-pay | Admitting: Internal Medicine

## 2010-03-12 NOTE — Progress Notes (Signed)
Summary: Foot exam for diabetic shoes/dmr  Phone Note Call from Patient Call back at Home Phone [phone]   Caller: Patient Summary of Call: patient left message that she has been waiting for her Doctor to sign paperwork for diabetic shoes from [facility] for > 1 month. [employer] will fax another form today to my attention. She'll be here tomorrow for an appointment. Upon reveiw of her chart notes- the most recent foot exam 11-30-08 monofilament exam appears to be normal- last complete foot exam 2008 showed heavy callus build up.  I will forward this to her PCP for her input. Initial call taken by: Barnabas Harries RD,CDE,  September 13, 2009 3:45 PM  Follow-up for Phone Call        spoke to patient about foot exam and qualifications per Medicare ; she had shoes before-has seen DR. Regal in the past and has bunions, swelling and  "pretty bad calluses"- will request nurse to initiate foot exam tomorrow to be sure we have proper documentation of need Follow-up by: Barnabas Harries RD,CDE,  September 13, 2009 4:16 PM

## 2010-03-12 NOTE — Progress Notes (Signed)
Summary: refill/ hla  Phone Note Refill Request Message from:  Fax from Pharmacy on November 10, 2006 10:04 AM  Refills Requested: Medication #1:  ADVAIR DISKUS 250-50 MCG/DOSE MISC 1 puff twice daily   Last Refilled: 07/16/2006 Initial call taken by: Freddy Finner RN,  November 10, 2006 10:04 AM  Follow-up for Phone Call        Refill approved-nurse to complete Follow-up by: Lucy Chris MD,  November 10, 2006 12:58 PM      Prescriptions: ADVAIR DISKUS 250-50 MCG/DOSE MISC (FLUTICASONE-SALMETEROL) 1 puff twice daily  #1 diskus x 5   Entered and Authorized by:   Lucy Chris MD   Signed by:   Lucy Chris MD on 11/10/2006   Method used:   Electronically sent to ...       [facility]       [address]       [place], [place]  [address]       Ph: [phone]       Fax: [phone]   RxID:   [id number]

## 2010-03-12 NOTE — Assessment & Plan Note (Signed)
Summary: diab shoe order/ hla   Vital Signs:  Patient Profile:   60 Years Old Female Height:     64 inches (162.56 cm) Weight:      184.8 pounds (84 kg) BMI:     31.84 Temp:     97.6 degrees F (36.44 degrees C) oral Pulse rate:   80 / minute BP sitting:   113 / 62  (right arm)  Pt. in pain?   yes    Location:   all over    Intensity:   7-8  Vitals Entered By: Hilda Blades Ditzler RN (September 16, 2006 1:46 PM)              Is Patient Diabetic? Yes  Nutritional Status BMI of > 30 = obese Nutritional Status Detail good CBG Result 161  Have you ever been in a relationship where you felt threatened, hurt or afraid?denies   Does patient need assistance? Functional Status Self care Ambulation Normal   PCP:  [name] MD  Chief Complaint:  Needs diabetic shoes..  History of Present Illness: Pt is a 60 yo woman with type 2 diabetes, HL and back pain who comes in today simply to request a prescription for diabetic shoes. She has never had ulcerative disease or infections. She c/o of having calluses and bunions. She needs to wear inserts inside her shoes because she has ''high arches''. Can only wear one pair of shoes at this time (tennis shoes) because she doesn't tolerate anything else. No other complaints.  Current Allergies (reviewed today): ! ULTRAM ! MORPHINE    Risk Factors: Tobacco use:  current    Cigarettes:  Yes -- 1.5 pack(s) per day Alcohol use:  no Exercise:  no Seatbelt use:  100 %   Review of Systems      See HPI   Physical Exam  General:     NAD.alert, well-developed, well-nourished, and well-hydrated.    I did not perform a complete physical exam today since pt had one done in 6/08 and she doesn't have any new complaints. Eyes:     Wearing glasses.    Impression & Recommendations:  Problem # 1:  DIABETES MELLITUS, TYPE I (ICD-250.01) Last A1c 6.1 so control optimal. Will write a prescription for diabetic shoes and refer pt to  [facility].  Her updated medication list for this problem includes:    Januvia 100 Mg Tabs (Sitagliptin phosphate) .Marland Kitchen... Take 1 tablet by mouth once a day    Glucophage Xr 500 Mg Tb24 (Metformin hcl) .Marland Kitchen... 2 pills in am, 2 pills in pm    Aspir-low 81 Mg Tbec (Aspirin)    Glipizide 5 Mg Tabs (Glipizide) .Marland Kitchen... 1/2 pill twice daily with meals  Orders: T- Capillary Blood Glucose (07867) T-Hgb A1C (in-house) ([id number])  Labs Reviewed: HgBA1c: 6.1 (05/27/2006)   Creat: 0.71 (06/29/2006)      Complete Medication List: 1)  Prilosec 40 Mg Cpdr (Omeprazole) .... Take 1 capsule by mouth once a day 2)  Alprazolam 0.5 Mg Tabs (Alprazolam) .... One pill daily as needed for anxiety 3)  Albuterol 90 Mcg/act Aers (Albuterol) .... P.r.n wheezing 4)  Hydrochlorothiazide 25 Mg Tabs (Hydrochlorothiazide) .... Take 1 tablet by mouth once a day 5)  Spiriva Handihaler 18 Mcg Caps (Tiotropium bromide monohydrate) .... Inhale contents of 1 capsule once a day 6)  Lipitor 20 Mg Tabs (Atorvastatin calcium) .... Take 1 tablet by mouth once a day 7)  Fosamax 70 Mg Tabs (Alendronate sodium) .... Take one  tablet weekly 8)  Januvia 100 Mg Tabs (Sitagliptin phosphate) .... Take 1 tablet by mouth once a day 9)  Advair Diskus 250-50 Mcg/dose Misc (Fluticasone-salmeterol) .Marland Kitchen.. 1 puff twice daily 10)  Glucophage Xr 500 Mg Tb24 (Metformin hcl) .... 2 pills in am, 2 pills in pm 11)  Cymbalta 30 Mg Cpep (Duloxetine hcl) .... Take 2  tables by mouth once daily 12)  Claritin 10 Mg Tabs (Loratadine) .... Once daily tab 13)  Aspir-low 81 Mg Tbec (Aspirin) 14)  Skelaxin 800 Mg Tabs (Metaxalone) .... One tablet every 8 hours as needed for muscle cramps 15)  Glipizide 5 Mg Tabs (Glipizide) .... 1/2 pill twice daily with meals   Patient Instructions: 1)  Make an appointment at [facility] [phone] and bring your prescription with you when you go see them.          Vital Signs:  Patient Profile:   60 Years Old  Female Height:     64 inches (162.56 cm) Weight:      184.8 pounds (84 kg) BMI:     31.84 Temp:     97.6 degrees F (36.44 degrees C) oral Pulse rate:   80 / minute BP sitting:   113 / 62    Location:   all over    Intensity:   7-8             CBG Result 161      Last LDL:                                                 76 (11/26/2005 9:22:00 AM)        Diabetic Foot Exam Foot Inspection Is there a history of a foot ulcer?              No Is there a foot ulcer now?              No Can the patient see the bottom of their feet?          Yes Are the shoes appropriate in style and fit?          Yes Is there swelling or an abnormal foot shape?          No Are the toenails long?                No Are the toenails thick?                No Are the toenails ingrown?              No Is there heavy callous build-up?              Yes Is there a claw toe deformity?                          No Is there elevated skin temperature?            No Is there limited ankle dorsiflexion?            No Is there foot or ankle muscle weakness?            No Do you have pain in calf while walking?           No         10-g (  5.07) Semmes-Weinstein Monofilament Test Performed by: Hilda Blades Ditzler RN          Right Foot          Left Foot Test Control      normal         normal Site 1         normal         normal Site 2         normal         normal Site 3         normal         normal Site 4         normal         normal Site 5         normal         normal Site 6         normal         normal Site 7         normal         normal Site 8         normal         normal Site 9         normal         normal Site 10         normal         normal  Laboratory Results   Blood Tests   Date/Time Recieved: September 16, 2006 2:22 PM  Date/Time Reported: ..................................................................Marland KitchenMelvia Heaps  September 16, 2006 2:22 PM   HGBA1C: 6.9%   (Normal Range:  Non-Diabetic - 3-6%   Control Diabetic - 6-8%) CBG Random: 161

## 2010-03-12 NOTE — Assessment & Plan Note (Signed)
Summary: ROUTINE CK/VS   Vital Signs:  Patient Profile:   60 Years Old Female Height:     64 inches (162.56 cm) Weight:      181.6 pounds (82.55 kg) Temp:     97.0 degrees F (36.11 degrees C) oral Pulse rate:   85 / minute BP supine:   134 / 72  (right arm)  Pt. in pain?   yes    Location:   arms, legs    Intensity:   4    Type:       aching  Vitals Entered By: Tor Netters RN (June 10, 2006 10:37 AM)              Is Patient Diabetic? Yes  Nutritional Status Normal  Have you ever been in a relationship where you felt threatened, hurt or afraid?No   Does patient need assistance? Functional Status Self care Ambulation Normal   PCP:  [name] MD  Chief Complaint:  follow up visit.  History of Present Illness: Blood sugars better controlled, but still having poastprandial sweats on a daily basis.    Had another episode of BRBPR  earlier this month, not assoicated with stools.  Episode was isolated but a little more blood this time.  Occurred prior to April 16 th appointment and mentioned it to Dr. Hilma Favors but no rectal exam done last eval.  No weight loss or frank diarrhea. Occasional gas/cramps, relieved with bowel movement.   Colonoscopy done within the last year by [name].  Polyps removed.   No path report or transcription available on E Chart.  Taking only 1 Cymbalta daily due to excessive sedation.  Using a xanax nightly for insomnia and is sleeping 5-6 hours minimum.    Some vaginal irritation but denies discharge and bleeding.     Current Allergies (reviewed today): ! ULTRAM ! MORPHINE      Physical Exam  Abdomen:     Bowel sounds positive,abdomen soft and non-tender without masses, organomegaly or hernias noted. Rectal:     no hemorrhoids, no tenderness, and external hemorrhoid(s).  Stool heme negataive Genitalia:     normal introitus, no external lesions, no vaginal discharge, mucosa pink and moist, no vaginal or cervical lesions, vaginal  stenosis, and vaginal atrophy.      Impression & Recommendations:  Problem # 1:  HEMATOCHEZIA (ICD-578.1) Rectal exam today is normal and per patient she has had a colonoscopy and EGD within the last year by Dr. Laveda Norman (records not available) which noted polyps per patient.  Given her additional history of postprandial flushing, will discuss with [name] the need to rule out carcinoid syndrome.    Problem # 2:  FACIAL FLUSHING (ICD-782.62) As above, her sx are ppostprandial.  Have asked her to r/o hypoglycemia as cause.  Given her history of tobacco abuse and colonic polyps, also considering carcinoid syndrome and will echeck 24 hour urinary excretion of 5 HIAA.   Orders: T-Urine 24 Hr. 5 HIAA ([id number])   Problem # 3:  DIABETES MELLITUS, TYPE I (ICD-250.01) Wee controlled on current regimen.  Pt to check post prandial blood sugars given symptoms of flushing.   HgbA1c 6.1 last week. Her updated medication list for this problem includes:    Januvia 100 Mg Tabs (Sitagliptin phosphate) .Marland Kitchen... Take 1 tablet by mouth once a day    Glucophage Xr 500 Mg Tb24 (Metformin hcl) .Marland Kitchen... 2 pills in am, 2 pills in pm    Aspir-low 81 Mg Tbec (Aspirin)  Glipizide 5 Mg Tabs (Glipizide) .Marland Kitchen... 1/2 pill twice daily with meals   Problem # 4:  ANXIETY (ICD-300.00) Impropved on once daily cymbalta and hs xanax.  Her updated medication list for this problem includes:    Xanax 0.25 Mg Tabs (Alprazolam) .Marland Kitchen... Take 1 tablet by mouth once a day p.r.n anxiety    Cymbalta 30 Mg Cpep (Duloxetine hcl) .Marland Kitchen... Take 1 tablet by mouth once daily   Problem # 5:  FIBROMYALGIA (ICD-729.1) Patient requstding Rx for water airobics or water theapy cdlasses, which I recommend whole heartedly.  Her updated medication list for this problem includes:    Vicodin 5-500 Mg Tabs (Hydrocodone-acetaminophen) .Marland Kitchen... Take 1 tablet by mouth every six hours as needed for pain    Aspir-low 81 Mg Tbec (Aspirin)    Skelaxin 800 Mg Tabs  (Metaxalone) ..... One tablet every 8 hours as needed for muscle cramps    Diclofenac Sodium 75 Mg Tbec (Diclofenac sodium) .Marland Kitchen..Marland Kitchen Two times a day tab   Medications Added to Medication List This Visit: 1)  Cymbalta 30 Mg Cpep (Duloxetine hcl) .... Take 1 tablet by mouth once daily  Other Orders: T-Urinalysis ([id number])   Patient Instructions: 1)  Please schedule a follow-up appointment in 1 month.       Last LDL:                                                 76 (11/26/2005 9:22:00 AM)            Appended Document: Orders Update    Clinical Lists Changes  Orders: Added new Test order of T-Basic Metabolic Panel ([id number]) - Signed

## 2010-03-12 NOTE — Assessment & Plan Note (Signed)
Summary: dr Derrel Nip pt, adjust meds/ hla   Vital Signs:  Patient Profile:   60 Years Old Female Height:     64 inches Weight:      178.8 pounds BMI:     30.80 Temp:     97.0 degrees F oral Pulse rate:   94 / minute BP sitting:   136 / 76  (right arm)  Pt. in pain?   yes    Location:   generalized    Intensity:   5    Type:       achy              Is Patient Diabetic? Yes  Nutritional Status overweight CBG Result 114  Have you ever been in a relationship where you felt threatened, hurt or afraid?No   Does patient need assistance? Functional Status Self care Ambulation Normal   Chief Complaint:  Needs DM med and antidepressant meds adjusted.  History of Present Illness: Sweating all the time.  Feels cold a lot, especially feet.  Notices that she sweats a lot after she eats.   Had BRBPR x 1 last week with urnation, not stooling.  Next day had large BM, no bleeding.  Colonoscopy in Sept 07, benign polyps.  Antidepressant (Cymbalta) started last visit (January) has caused insomnia.  Taking it twice daily. No appreciable positive effects of drug. Has tried Lexapro in past, no effect.  had been on a "sleeping pill" in the past but fell out of bed and broke collarbone and doesn't want to ever use on again.   Many social stressors contributing to mood: 1) His [place] Muslim husband has been in [place] since August , has been unfaithful; is actually married and has a year-old child over there with his [place] wife.  She plans to tell him that if he returns to [place] he will not be moving in with her. 2) Her married daughter in [place] wants her to some down to babysit her 3 children while she delivers her fourth child.   Depression History:      Positive alarm features for depression include insomnia, fatigue (loss of energy), and impaired concentration (indecisiveness).        Psychosocial stress factors include a recent divorce and major life changes.  The patient denies that she feels  like life is not worth living, denies that she wishes that she were dead, and denies that she has thought about ending her life.  Due to her current symptoms, it often takes extra effort to do the things she needs to do.         Prior Medications: PRILOSEC 40 MG CPDR (OMEPRAZOLE) Take 1 capsule by mouth once a day XANAX 0.25 MG TABS (ALPRAZOLAM) Take 1 tablet by mouth once a day p.r.n anxiety ALBUTEROL 90 MCG/ACT AERS (ALBUTEROL) p.r.n wheezing HYDROCHLOROTHIAZIDE 25 MG TABS (HYDROCHLOROTHIAZIDE) Take 1 tablet by mouth once a day SPIRIVA HANDIHALER 18 MCG CAPS (TIOTROPIUM BROMIDE MONOHYDRATE) Inhale contents of 1 capsule once a day LIPITOR 20 MG TABS (ATORVASTATIN CALCIUM) Take 1 tablet by mouth once a day FOSAMAX 70 MG TABS (ALENDRONATE SODIUM) weekly but she has not started this JANUVIA 100 MG TABS (SITAGLIPTIN PHOSPHATE) Take 1 tablet by mouth once a day ADVAIR DISKUS 250-50 MCG/DOSE MISC (FLUTICASONE-SALMETEROL) 1 puff twice daily CYMBALTA 30 MG CPEP (DULOXETINE HCL) Take 1 tablet by mouth once a day x 1 wk, then twice daily CHANTIX 1 MG TABS (VARENICLINE TARTRATE) Take 1 tablet by mouth two times a  day for 3 months VICODIN 5-500 MG TABS (HYDROCODONE-ACETAMINOPHEN) Take 1 tablet by mouth every six hours as needed for pain Current Allergies: ! ULTRAM ! MORPHINE    Risk Factors:    Review of Systems  The patient denies anorexia, chest pain, dyspnea on exhertion, peripheral edema, and prolonged cough.     Physical Exam  Mouth:     Oral mucosa and oropharynx without lesions or exudates.  Teeth in good repair. Neck:     No deformities, masses, or tenderness noted. Lungs:     Normal respiratory effort, chest expands symmetrically. Lungs are clear to auscultation, no crackles or wheezes. Heart:     Normal rate and regular rhythm. S1 and S2 normal without gallop, murmur, click, rub or other extra sounds. Abdomen:     Bowel sounds positive,abdomen soft and non-tender without  masses, organomegaly or hernias noted. Psych:     Oriented X3, memory intact for recent and remote, normally interactive, good eye contact, not anxious appearing, not depressed appearing, and not agitated.      Impression & Recommendations:  Problem # 1:  DEPRESSION (ICD-311)  Her updated medication list for this problem includes:    Xanax 0.25 Mg Tabs (Alprazolam) .Marland Kitchen... Take 1 tablet by mouth once a day p.r.n anxiety    Cymbalta 30 Mg Cpep (Duloxetine hcl) .Marland Kitchen... Take 1 tablet by mouth once a day x 1 wk, then twice daily  Will decrease the cymbalta to one daily for one week, then stop.  When she returns from [place], will decide on whether she needs to start another one. Wellbutyrin caused tachycardia.  Elavil caused excessive sedation. Orders: T-TSH ([id number])   Problem # 2:  DIABETES MELLITUS, TYPE I (ICD-250.01)  Her updated medication list for this problem includes:    Januvia 100 Mg Tabs (Sitagliptin phosphate) .Marland Kitchen... Take 1 tablet by mouth once a day    Glucophage Xr 500 Mg Tb24 (Metformin hcl) .Marland Kitchen... 2 pills in am, 2 pills in pm    Aspir-low 81 Mg Tbec (Aspirin)    Glipizide 5 Mg Tabs (Glipizide) .Marland Kitchen... 1/2 pill twice daily with meals Add glipizide 2.5 mg two times a day   Problem # 3:  LOW BACK PAIN (ICD-724.2)  Her updated medication list for this problem includes:    Vicodin 5-500 Mg Tabs (Hydrocodone-acetaminophen) .Marland Kitchen... Take 1 tablet by mouth every six hours as needed for pain    Aspir-low 81 Mg Tbec (Aspirin)    Skelaxin 800 Mg Tabs (Metaxalone) ..... One tablet every 8 hours as needed for muscle cramps    Diclofenac Sodium 75 Mg Tbec (Diclofenac sodium) .Marland Kitchen..Marland Kitchen Two times a day tab  Ultram caused swelling, will add diclofenac 75 mg two times a day to vicodin.  Medications Added to Medication List This Visit: 1)  Glucophage Xr 500 Mg Tb24 (Metformin hcl) .... 2 pills in am, 2 pills in pm 2)  Claritin 10 Mg Tabs (Loratadine) .... Once daily tab 3)  Aspir-low 81 Mg Tbec  (Aspirin) 4)  Skelaxin 800 Mg Tabs (Metaxalone) .... One tablet every 8 hours as needed for muscle cramps 5)  Diclofenac Sodium 75 Mg Tbec (Diclofenac sodium) .... Two times a day tab 6)  Glipizide 5 Mg Tabs (Glipizide) .... 1/2 pill twice daily with meals   Patient Instructions: 1)  Please schedule a follow-up appointment in 1 month. Prescriptions: GLIPIZIDE 5 MG TABS (GLIPIZIDE) 1/2 pill twice daily with meals  #60 x 2   Entered and Authorized by:  [name] MD   Signed by:   [name] MD on 04/16/2006   Method used:   Print then Give to Patient   RxID:   [id number] XANAX 0.25 MG TABS (ALPRAZOLAM) Take 1 tablet by mouth once a day p.r.n anxiety  #60 x 3   Entered and Authorized by:   [name] MD   Signed by:   [name] MD on 04/16/2006   Method used:   Print then Give to Patient   RxID:   [id number] DICLOFENAC SODIUM 75 MG TBEC (DICLOFENAC SODIUM) two times a day tab  #60 x 3   Entered and Authorized by:   [name] MD   Signed by:   [name] MD on 04/16/2006   Method used:   Print then Give to Patient   RxID:   [id number]

## 2010-03-12 NOTE — Progress Notes (Signed)
Summary: Social Work  Dealer placed by: Social Work Call placed to: Patient Summary of Call: I had called the patient in response to Dr. Ross Marcus referral that she was needing help locating a therapist who accepted Medicare.  I called the patient and she said she could not get in with the providers she contacted.   I did some research and located two therapists who accept Medicare and will work with pt. regarding copays.  One was [name] and the other was [name].  I mailed the information for those two therapists directly to [facility] so she could make an appointment asap.

## 2010-03-12 NOTE — Progress Notes (Signed)
Summary: med refill/gp  Phone Note Refill Request Message from:  Fax from Pharmacy on December 06, 2007 2:17 PM  Refills Requested: Medication #1:  VICODIN 5-500 MG  TABS Take one tablet by mouth every eight hours as needed for pain   Last Refilled: 10/01/2007  Method Requested: Telephone to Pharmacy Initial call taken by: Morrison Old RN,  December 06, 2007 2:18 PM  Follow-up for Phone Call        Refill approved-nurse to complete Follow-up by: Lucy Chris MD,  December 06, 2007 6:27 PM  Additional Follow-up for Phone Call Additional follow up Details #1::        Rx. refill called in to [facility]. Additional Follow-up by: Morrison Old RN,  December 07, 2007 8:52 AM      Prescriptions: VICODIN 5-500 MG  TABS (HYDROCODONE-ACETAMINOPHEN) Take one tablet by mouth every eight hours as needed for pain  #90 x 5   Entered and Authorized by:   Lucy Chris MD   Signed by:   Lucy Chris MD on 12/06/2007   Method used:   Telephoned to ...       [facility])       [address]       [place], [place]  [address]       Ph: [phone]       Fax: [phone]   RxID:   [id number]

## 2010-03-12 NOTE — Progress Notes (Signed)
Summary: SW Follow-up/Smoking Cessation  Phone Note Other Incoming   Call placed by: Patient Call placed to: Soc. Work Architectural technologist of Call: Rasheedah said that she's almost completed two days smoke free. She's using the patch, sugar free gum, sugar free candy and straws.   She's cleaned her house out of cigarettes. She's trying to keep busy.  Right now she's feeling the emotional loss moreso than any physical withdrawal symptoms.  She's had a slight headache.   We will touch base next week to see how she's doing.

## 2010-03-12 NOTE — Letter (Signed)
Summary: ONE TOUCH 10-25-2009/11-23-2009  ONE TOUCH 10-25-2009/11-23-2009   Imported By: Garlan Fillers 11/27/2009 14:05:38  _____________________________________________________________________  External Attachment:    Type:   Image     Comment:   External Document

## 2010-03-12 NOTE — Progress Notes (Signed)
Summary: ? increase lantus insulin/glipizide refill/dmr  Phone Note Call from Patient Call back at Home Phone [phone]   Caller: Patient Complaint: Chest Pain Summary of Call: blood sugar seem high 253 right now, 178,this morning 196, 192 after dinner last night,  256 on the 3rd, on the 1st it was 161, 171 on the 31st, 30th 257. blood sugar seems worse since starting insulin.  discussed self titration and she is fine with that to get blood sugars under beter control  also mentioned that she has been tryign to get refill Rx for glipizide. Has only one glipizide left- note refill was denied on the 24th.  will request self titration approval form her PCP and investigate glipizide refill denial to let patient know aht she must do to have it refilled Initial call taken by: Barnabas Harries RD,CDE,  May 15, 2008 1:02 PM  Follow-up for Phone Call        Glipizide refill was denied because it was a duplicate.  #60 with 5 refills sent electronically on 3/24.  OK to titrate insulin. Follow-up by: Felicity Pellegrini MD,  May 15, 2008 2:23 PM  Additional Follow-up for Phone Call Additional follow up Details #1::        made appt for 4/6, have since spoken to dr gray, will cancel appt 4/6, called glipizide to pharm, d riley will work w/ pt on insulin coverage Additional Follow-up by: Freddy Finner RN,  May 15, 2008 2:37 PM    Additional Follow-up for Phone Call Additional follow up Details #2::    Please reschedule it.  That clinic is already full with six patients.  She can see me in the next available or another provider if she wishes.  Follow-up by: Felicity Pellegrini MD,  May 15, 2008 2:45 PM  Additional Follow-up for Phone Call Additional follow up Details #3:: Details for Additional Follow-up Action Taken: called patient and discussed insulinself- titratiton: she asid she'll increase lantus 1unit each day until morning fasting under 130 mg/dl or on 16 units. will call if nay more  difficulty getting glipizide refill and next week to let us know how her blood sugars are doing. Additional Follow-up by: Barnabas Harries RD,CDE,  May 15, 2008 4:41 PM  New/Updated Medications: LANTUS 100 UNIT/ML SOLN (INSULIN GLARGINE) Inject 16 units subcutaneously daily

## 2010-03-12 NOTE — Progress Notes (Signed)
Summary: Refill/gh  Phone Note Refill Request Message from:  Pharmacy on January 01, 2007 11:37 AM  Refills Requested: Medication #1:  HYDROCHLOROTHIAZIDE 25 MG TABS Take 1 tablet by mouth once a day   Last Refilled: 12/02/2006  Medication #2:  GLIPIZIDE 5 MG TABS 1/2 pill twice daily with meals  Method Requested: Electronic Initial call taken by: Sander Nephew RN,  January 01, 2007 11:37 AM  Follow-up for Phone Call        Done. Follow-up by: Efraim Kaufmann MD,  January 04, 2007 5:28 PM  Additional Follow-up for Phone Call Additional follow up Details #1::       Additional Follow-up by: Sander Nephew RN,  January 05, 2007 10:46 AM      Prescriptions: HYDROCHLOROTHIAZIDE 25 MG TABS (HYDROCHLOROTHIAZIDE) Take 1 tablet by mouth once a day  #31 x 5   Entered and Authorized by:   Efraim Kaufmann MD   Signed by:   Efraim Kaufmann MD on 01/04/2007   Method used:   Electronically sent to ...       [facility] [place], [place]  [address]       Ph: [phone]       Fax: [phone]   RxID:   [id number] GLIPIZIDE 5 MG TABS (GLIPIZIDE) 1/2 pill twice daily with meals  #60 x 2   Entered and Authorized by:   Izora Gala Phifer MD   Signed by:   Izora Gala Phifer MD on 01/04/2007   Method used:   Electronically sent to ...       [place], [place]  [address]       Ph: [phone]       Fax: [phone]   RxID:   [id number]

## 2010-03-12 NOTE — Miscellaneous (Signed)
Summary: [facility]: Diabetic Shoe  [facility]: Di[facility]   Imported By: Bonner Puna 10/21/2006 11:09:25  _____________________________________________________________________  External Attachment:    Type:   Image     Comment:   External Document

## 2010-03-12 NOTE — Assessment & Plan Note (Signed)
Summary: ROUTINE CK/EST/VS   Vital Signs:  Patient profile:   60 year old female Height:      64 inches (162.56 cm) Weight:      176.1 pounds (80.05 kg) Temp:     97.0 degrees F (36.11 degrees C) oral Pulse rate:   89 / minute BP sitting:   113 / 78  (right arm) Cuff size:   regular  Vitals Entered By: [name]) (April 18, 2008 10:59 AM) Is Patient Diabetic? Yes  Pain Assessment Patient in pain? yes     Location: arms/back Intensity: 3 Onset of pain  Chronic Nutritional Status BMI of > 30 = obese  Have you ever been in a relationship where you felt threatened, hurt or afraid?No   Does patient need assistance? Functional Status Self care Ambulation Normal   Primary Care Provider:  Lucy Chris MD   History of Present Illness: [name] is a 60 yo woman with extensive PMH as noted who presents today for routine follow-up but reports extreme fatigue, hypersomnia, tearfulness, and anhedonia.  She does not feel this is secondary to depression.  Cymbalta helps her pain but not depressive symptoms.  She denies suicidal ideation and manic episodes.    She does not use Albuterol inhaler at all but does think she improved on and needs Spiriva and Advair.   She is not using her CPAP machineat at all.    Depression History:      The patient is having a depressed mood most of the day and has a diminished interest in her usual daily activities.        The patient denies that she feels like life is not worth living, denies that she wishes that she were dead, and denies that she has thought about ending her life.         Preventive Screening-Counseling & Management     Smoking Status: current     Smoking Cessation Counseling: yes     Packs/Day: 1.5  Current Medications (verified): 1)  Prilosec Otc 20 Mg  Tbec (Omeprazole Magnesium) .... Take Two Tablets By Mouth Daily 2)  Alprazolam 0.5 Mg  Tabs (Alprazolam) .... One Pill Daily As Needed For Anxiety 3)   Albuterol 90 Mcg/act Aers (Albuterol) .... Two Puffs Every Four Hours As Needed For Shortness of Breath 4)  Hydrochlorothiazide 25 Mg Tabs (Hydrochlorothiazide) .... Take 1 Tablet By Mouth Once A Day 5)  Spiriva Handihaler 18 Mcg Caps (Tiotropium Bromide Monohydrate) .... Inhale Contents of 1 Capsule Once A Day 6)  Crestor 20 Mg  Tabs (Rosuvastatin Calcium) .... Take 1 Tablet By Mouth Once A Day 7)  Advair Diskus 250-50 Mcg/dose Misc (Fluticasone-Salmeterol) .Marland Kitchen.. 1 Puff Twice Daily 8)  Glucophage Xr 500 Mg Tb24 (Metformin Hcl) .... 2 Pills in Am, 2 Pills in Pm 9)  Cymbalta 30 Mg Cpep (Duloxetine Hcl) .... Take 3  Tables By Mouth Once Daily 10)  Aspir-Low 81 Mg Tbec (Aspirin) 11)  Glipizide Xl 10 Mg  Tb24 (Glipizide) .... Take 2 Tablet By Mouth Once A Day 12)  Hydrocodone-Ibuprofen 7.5-200 Mg Tabs (Hydrocodone-Ibuprofen) .... Take One Tab By Mouth Once Every 6 Hours As Needed For Pain 13)  Loratadine 10 Mg  Tabs (Loratadine) .... Take 1 Tablet By Mouth Once A Day 14)  Ferrous Sulfate 325 (65 Fe) Mg  Tbec (Ferrous Sulfate) .... Take 1 Tablet By Mouth Three Times A Day 15)  Topamax 25 Mg  Tabs (Topiramate) .... Take 1 Tablet By  Mouth Twice A Day  Allergies: 1)  ! Ultram 2)  ! Morphine  Physical Exam  General:  alert and overweight-appearing.  NAD Lungs:  normal respiratory effort, no accessory muscle use, normal breath sounds, no crackles, and no wheezes.   Heart:  normal rate, regular rhythm, no murmur, no gallop, and no rub.   Abdomen:  soft, non-tender, normal bowel sounds, and no distention.   Extremities:  No LE edema, no joint effusions, no joints with increased calor   Review of Systems General:  Complains of fatigue; denies chills, fever, and weakness. CV:  Denies chest pain or discomfort, difficulty breathing at night, difficulty breathing while lying down, fainting, lightheadness, near fainting, palpitations, shortness of breath with exertion, and swelling of feet. GI:  Denies  bloody stools, change in bowel habits, nausea, and vomiting.  Impression & Recommendations:  Problem # 1:  FIBROMYALGIA (ICD-729.1) Given severity of symptoms, will recheck sed rate and thyroid studies.  Will also try pt off of Topamax to see if she is really benefiting from this or if it is worsening her fatigue.   Her updated medication list for this problem includes:    Aspir-low 81 Mg Tbec (Aspirin)    Hydrocodone-ibuprofen 7.5-200 Mg Tabs (Hydrocodone-ibuprofen) .Marland Kitchen... Take one tab by mouth once every 6 hours as needed for pain  Orders: T-Sed Rate (Automated) [id number]) T-Vitamin B12 [phone]) T-Syphilis Test (RPR) ([id number])  Problem # 2:  ANEMIA (ICD-285.9) Repeat CBC pending given severe fatigue.   Her updated medication list for this problem includes:    Ferrous Sulfate 325 (65 Fe) Mg Tbec (Ferrous sulfate) .Marland Kitchen... Take 1 tablet by mouth three times a day  Orders: T-CBC No Diff ([id number])  Problem # 3:  OBSTRUCTIVE SLEEP APNEA (ICD-780.57) Pt counseled on risks of not using CPAP and how it may be making fatigue worse.  Pt agrees to reusing it.  She states CPAP has been titrated recently.    Problem # 4:  COPD (KXF-818) I doubt that she needs or is benefiting from Spiriva.  Pt does not want trial off of these.  She reports completing PFT's within last 4-5 years.  Will review of these.    Her updated medication list for this problem includes:    Albuterol 90 Mcg/act Aers (Albuterol) .Marland Kitchen..Marland Kitchen Two puffs every four hours as needed for shortness of breath    Spiriva Handihaler 18 Mcg Caps (Tiotropium bromide monohydrate) ..... Inhale contents of 1 capsule once a day    Advair Diskus 250-50 Mcg/dose Misc (Fluticasone-salmeterol) .Marland Kitchen... 1 puff twice daily  Problem # 5:  AODM (ICD-250.00) Great control previously.  Repeat HgbA1C pending as well as a lipid panel.  Eye exam completed about eight months ago.   Surprisingly, [name] is not on an ACE inhibitor and needs a  urine microalbumin at her next visit.   Her updated medication list for this problem includes:    Glucophage Xr 500 Mg Tb24 (Metformin hcl) .Marland Kitchen... 2 pills in am, 2 pills in pm    Aspir-low 81 Mg Tbec (Aspirin)    Glipizide Xl 10 Mg Tb24 (Glipizide) .Marland Kitchen... Take 2 tablet by mouth once a day  Orders: T-Hgb A1C (in-house) [id number]) T-Lipid Profile [id number]) T-Comprehensive Metabolic Panel ([id number])  Labs Reviewed: Creat: 0.89 (12/07/2007)     Last Eye Exam: No diabetic retinopathy.    (09/09/2007)  Complete Medication List: 1)  Prilosec Otc 20 Mg Tbec (Omeprazole magnesium) .... Take two tablets by mouth daily 2)  Alprazolam 0.5 Mg  Tabs (Alprazolam) .... One pill daily as needed for anxiety 3)  Albuterol 90 Mcg/act Aers (Albuterol) .... Two puffs every four hours as needed for shortness of breath 4)  Hydrochlorothiazide 25 Mg Tabs (Hydrochlorothiazide) .... Take 1 tablet by mouth once a day 5)  Spiriva Handihaler 18 Mcg Caps (Tiotropium bromide monohydrate) .... Inhale contents of 1 capsule once a day 6)  Crestor 20 Mg Tabs (Rosuvastatin calcium) .... Take 1 tablet by mouth once a day 7)  Advair Diskus 250-50 Mcg/dose Misc (Fluticasone-salmeterol) .Marland Kitchen.. 1 puff twice daily 8)  Glucophage Xr 500 Mg Tb24 (Metformin hcl) .... 2 pills in am, 2 pills in pm 9)  Cymbalta 30 Mg Cpep (Duloxetine hcl) .... Take 3  tables by mouth once daily 10)  Aspir-low 81 Mg Tbec (Aspirin) 11)  Glipizide Xl 10 Mg Tb24 (Glipizide) .... Take 2 tablet by mouth once a day 12)  Hydrocodone-ibuprofen 7.5-200 Mg Tabs (Hydrocodone-ibuprofen) .... Take one tab by mouth once every 6 hours as needed for pain 13)  Loratadine 10 Mg Tabs (Loratadine) .... Take 1 tablet by mouth once a day 14)  Ferrous Sulfate 325 (65 Fe) Mg Tbec (Ferrous sulfate) .... Take 1 tablet by mouth three times a day  Other Orders: T-TSH ([id number]) T-T4, Free [id number] [phone])  Patient Instructions: 1)  Please schedule a follow-up  appointment in 1 month. 2)  Stop taking Topamax and note how it affects your pain level.   3)  The medication list was reviewed and reconciled.  All changed / newly prescribed medications were explained.  A complete medication list was provided to the patient / caregiver.   Appended Document: Lab Order    Lab Visit   Laboratory Results   Blood Tests   Date/Time Received: April 18, 2008 12:07 PM. Date/Time Reported: [name]  April 18, 2008 12:07 PM  HGBA1C: 8.2%   (Normal Range: Non-Diabetic - 3-6%   Control Diabetic - 6-8%)     Orders Today:

## 2010-03-12 NOTE — Assessment & Plan Note (Signed)
Summary: FU OV/KIRK/VS   Vital Signs:  Patient Profile:   60 Years Old Female Height:     64 inches (162.56 cm) Weight:      179.6 pounds BMI:     30.94 Temp:     97.8 degrees F oral Pulse rate:   92 / minute BP sitting:   118 / 68  (right arm)  Pt. in pain?   yes    Location:   back    Intensity:   5    Type:       aching  Vitals Entered By: Silverio Decamp NT II (January 14, 2008 11:18 AM)              Is Patient Diabetic? Yes Did you bring your meter with you today? No Nutritional Status BMI of 19 -24 = normal  Does patient need assistance? Functional Status Self care Ambulation Normal     PCP:  Lucy Chris MD   History of Present Illness: 60 y/o female with PMH as outlined in EMR who was recently seen in [place] for right flank pain.  Pain has not improved since then.  Vidodin is helpful but pt has not taken very many of it.  Now with occasional sharp quality which comes on intermittently and not worsened with movement.  But overall still a  dull pain and is a constant 6/10 since onset.  Heating pad not very helpful.  Does not feel like this is her fibromyalgia acting up.  Denies any fevers, chills, CP, SOB, dysuria, hematuria, frequency, N/V, diarrhea.      Prior Medication List:  PRILOSEC OTC 20 MG  TBEC (OMEPRAZOLE MAGNESIUM) Take two tablets by mouth daily ALPRAZOLAM 0.5 MG  TABS (ALPRAZOLAM) one pill daily as needed for anxiety ALBUTEROL 90 MCG/ACT AERS (ALBUTEROL) Two puffs every four hours as needed for shortness of breath HYDROCHLOROTHIAZIDE 25 MG TABS (HYDROCHLOROTHIAZIDE) Take 1 tablet by mouth once a day SPIRIVA HANDIHALER 18 MCG CAPS (TIOTROPIUM BROMIDE MONOHYDRATE) Inhale contents of 1 capsule once a day CRESTOR 20 MG  TABS (ROSUVASTATIN CALCIUM) Take 1 tablet by mouth once a day ADVAIR DISKUS 250-50 MCG/DOSE MISC (FLUTICASONE-SALMETEROL) 1 puff twice daily GLUCOPHAGE XR 500 MG TB24 (METFORMIN HCL) 2 pills in AM, 2 pills in PM CYMBALTA 30 MG CPEP  (DULOXETINE HCL) Take 3  tables by mouth once daily ASPIR-LOW 81 MG TBEC (ASPIRIN)  GLIPIZIDE XL 10 MG  TB24 (GLIPIZIDE) Take 2 tablet by mouth once a day VICODIN 5-500 MG  TABS (HYDROCODONE-ACETAMINOPHEN) Take one tablet by mouth every eight hours as needed for pain LORATADINE 10 MG  TABS (LORATADINE) Take 1 tablet by mouth once a day NYSTATIN 100000 UNIT/GM  CREA (NYSTATIN) Apply to affected area two times a day. FERROUS SULFATE 325 (65 FE) MG  TBEC (FERROUS SULFATE) Take 1 tablet by mouth three times a day TOPAMAX 25 MG  TABS (TOPIRAMATE) Take 1 tablet by mouth twice a day   Current Allergies: ! ULTRAM ! MORPHINE    Risk Factors: Tobacco use:  current    Year started:  AT THE AGE OF 12    Cigarettes:  Yes -- 1.5 pack(s) per day Alcohol use:  no Exercise:  no Seatbelt use:  100 %  Colonoscopy History:    Date of Last Colonoscopy:  08/18/2005  Mammogram History:    Date of Last Mammogram:  08/03/2007  PAP Smear History:    Date of Last PAP Smear:  06/29/2007   Review of Systems  See HPI   Physical Exam  General:     Mild distress due to pain, A&Ox3 Eyes:     vision grossly intact, pupils equal, pupils round, and pupils reactive to light.   Lungs:     normal respiratory effort, no accessory muscle use, normal breath sounds, no crackles, and no wheezes.   Heart:     normal rate, regular rhythm, no murmur, no gallop, and no rub.   Abdomen:     soft, non-tender, normal bowel sounds, and no distention.   Msk:     Pain reproduceable to palpation on right lower lateral ribs. No obvious deformity or contusions noted. Neurologic:     alert & oriented X3, cranial nerves II-XII intact, strength normal in all extremities, sensation intact to light touch, and gait normal.   Skin:     No lesions noted.    Impression & Recommendations:  Problem # 1:  LOW BACK PAIN (ICD-724.2) Pt's right lower lateral rib pain still persists and pt is still getting releif with the  vicodin although she does not like to use this very often.  UA was clean but culture showed E. Coli growth. Pt has no symptoms of UTI but will still treat with Bactrim DS for 3 days.  Plain films of the area were negative for any acute fracture or orther changes.  MSK strain is still high on ddx so have instructed pt to start with a two week course of naproxen only if pain persists after completion of her antibiotics (pt already on a PPI).   Will have pt f/u with PCP at next scheduled appointment, sooner if needed.     Her updated medication list for this problem includes:    Aspir-low 81 Mg Tbec (Aspirin)    Vicodin 5-500 Mg Tabs (Hydrocodone-acetaminophen) .Marland Kitchen... Take one tablet by mouth every eight hours as needed for pain    Naprosyn 500 Mg Tabs (Naproxen) .Marland Kitchen... Take one tab by mouth twice daily for two weeks.   Complete Medication List: 1)  Prilosec Otc 20 Mg Tbec (Omeprazole magnesium) .... Take two tablets by mouth daily 2)  Alprazolam 0.5 Mg Tabs (Alprazolam) .... One pill daily as needed for anxiety 3)  Albuterol 90 Mcg/act Aers (Albuterol) .... Two puffs every four hours as needed for shortness of breath 4)  Hydrochlorothiazide 25 Mg Tabs (Hydrochlorothiazide) .... Take 1 tablet by mouth once a day 5)  Spiriva Handihaler 18 Mcg Caps (Tiotropium bromide monohydrate) .... Inhale contents of 1 capsule once a day 6)  Crestor 20 Mg Tabs (Rosuvastatin calcium) .... Take 1 tablet by mouth once a day 7)  Advair Diskus 250-50 Mcg/dose Misc (Fluticasone-salmeterol) .Marland Kitchen.. 1 puff twice daily 8)  Glucophage Xr 500 Mg Tb24 (Metformin hcl) .... 2 pills in am, 2 pills in pm 9)  Cymbalta 30 Mg Cpep (Duloxetine hcl) .... Take 3  tables by mouth once daily 10)  Aspir-low 81 Mg Tbec (Aspirin) 11)  Glipizide Xl 10 Mg Tb24 (Glipizide) .... Take 2 tablet by mouth once a day 12)  Vicodin 5-500 Mg Tabs (Hydrocodone-acetaminophen) .... Take one tablet by mouth every eight hours as needed for pain 13)  Loratadine  10 Mg Tabs (Loratadine) .... Take 1 tablet by mouth once a day 14)  Nystatin 100000 Unit/gm Crea (Nystatin) .... Apply to affected area two times a day. 15)  Ferrous Sulfate 325 (65 Fe) Mg Tbec (Ferrous sulfate) .... Take 1 tablet by mouth three times a day 16)  Topamax 25 Mg  Tabs (Topiramate) .... Take 1 tablet by mouth twice a day 17)  Bactrim Ds 800-160 Mg Tabs (Sulfamethoxazole-trimethoprim) .... Take one tab by mouth twice daily for three days. 18)  Naprosyn 500 Mg Tabs (Naproxen) .... Take one tab by mouth twice daily for two weeks.   Patient Instructions: 1)  Please schedule an appointment with your primary doctor in : 2 months, sooner if needed. 2)  Start your naprosyn AFTER you complete your Bactrim and only if your pain persists.     Prescriptions: NAPROSYN 500 MG TABS (NAPROXEN) Take one tab by mouth twice daily for two weeks.  #28 x 0   Entered and Authorized by:   Stephanie Coup MD   Signed by:   Stephanie Coup MD on 01/14/2008   Method used:   Electronically to        [facility] (retail)       [place], [place]  [address]       Ph: [phone]       Fax: [phone]   RxID:   [id number] BACTRIM DS 800-160 MG TABS (SULFAMETHOXAZOLE-TRIMETHOPRIM) Take one tab by mouth twice daily for three days.  #6 x 0   Entered and Authorized by:   Stephanie Coup MD   Signed by:   Stephanie Coup MD on 01/14/2008   Method used:   Electronically to        [facility] [address] (retail)       [place], [place]  [address]       Ph: [phone]       Fax: [phone]   RxID:   [id number]  ]  Vital Signs:  Patient Profile:   60 Years Old Female Height:     64 inches (162.56 cm) Weight:      179.6 pounds BMI:     30.94 Temp:     97.8 degrees F oral Pulse rate:   92 / minute BP sitting:   118 / 68    Location:   back    Intensity:   5    Type:       aching             Last PAP Result  Specimen Adequacy:  Unsatisfactory for evaluation.  Interpretation/Result:Negative for intraepithelial Lesion or Malignancy.    PapHx   Specimen Adequacy: Unsatisfactory for evaluation.  Interpretation/Result:Negative for intraepithelial Lesion or Malignancy.    (06/29/2007 9:52:43 AM)     Appended Document: FU OV/KIRK/VS    Clinical Lists Changes  Orders: Added new Service order of Est. Patient Level III (59458) - Signed       Complete Medication List: 1)  Prilosec Otc 20 Mg Tbec (Omeprazole magnesium) .... Take two tablets by mouth daily 2)  Alprazolam 0.5 Mg Tabs (Alprazolam) .... One pill daily as needed for anxiety 3)  Albuterol 90 Mcg/act Aers (Albuterol) .... Two puffs every four hours as needed for shortness of breath 4)  Hydrochlorothiazide 25 Mg Tabs (Hydrochlorothiazide) .... Take 1 tablet by mouth once a day 5)  Spiriva Handihaler 18 Mcg Caps (Tiotropium bromide monohydrate) .... Inhale contents of 1 capsule once a day 6)  Crestor 20 Mg Tabs (Rosuvastatin calcium) .... Take 1 tablet by mouth once a day 7)  Advair Diskus 250-50 Mcg/dose Misc (Fluticasone-salmeterol) .Marland Kitchen.. 1 puff twice daily 8)  Glucophage Xr 500 Mg Tb24 (Metformin hcl) .... 2 pills in am, 2  pills in pm 9)  Cymbalta 30 Mg Cpep (Duloxetine hcl) .... Take 3  tables by mouth once daily 10)  Aspir-low 81 Mg Tbec (Aspirin) 11)  Glipizide Xl 10 Mg Tb24 (Glipizide) .... Take 2 tablet by mouth once a day 12)  Hydrocodone-ibuprofen 7.5-200 Mg Tabs (Hydrocodone-ibuprofen) .... Take one tab by mouth once every 6 hours as needed for pain 13)  Loratadine 10 Mg Tabs (Loratadine) .... Take 1 tablet by mouth once a day 14)  Nystatin 100000 Unit/gm Crea (Nystatin) .... Apply to affected area two times a day. 15)  Ferrous Sulfate 325 (65 Fe) Mg Tbec (Ferrous sulfate) .... Take 1 tablet by mouth three times a day 16)  Topamax 25 Mg Tabs (Topiramate) .... Take 1 tablet by mouth twice a day 17)  Acyclovir 800 Mg Tabs (Acyclovir) .... Take  one tab by mouth every 4 hours (five times a day) for 10 days

## 2010-03-12 NOTE — Progress Notes (Signed)
Summary: refill/hla  Phone Note Refill Request Message from:  Fax from Pharmacy on June 02, 2008 3:30 PM  Refills Requested: Medication #1:  ONETOUCH ULTRA TEST  STRP Use to test blood sugars three times a day Initial call taken by: Freddy Finner RN,  June 02, 2008 3:31 PM  Follow-up for Phone Call        Refill approved-nurse to complete Follow-up by: Felicity Pellegrini MD,  June 04, 2008 5:30 AM      Prescriptions: Donald Siva TEST  STRP (GLUCOSE BLOOD) Use to test blood sugars three times a day  #90 x 11   Entered and Authorized by:   Felicity Pellegrini MD   Signed by:   Felicity Pellegrini MD on 06/04/2008   Method used:   Electronically to        [facility] [phone]* (retail)       [address]       [place], [place]  [address]       Ph: [phone]       Fax: [phone]   RxID:   [id number]

## 2010-03-12 NOTE — Progress Notes (Signed)
Summary: glipizide national back order/ hla  Phone Note Refill Request Message from:  Fax from Pharmacy on June 08, 2008 10:35 AM  glipizide is on a national back order could we please substitute with something else?  Initial call taken by: Freddy Finner RN,  June 08, 2008 10:35 AM  Follow-up for Phone Call        How can all forms of glipizide be on national back order?  OK to use generic or name brand.  Follow-up by: Felicity Pellegrini MD,  June 08, 2008 1:06 PM  Additional Follow-up for Phone Call Additional follow up Details #1::        called to pharmacy 5/5 Additional Follow-up by: Freddy Finner RN,  Jun 15, 2008 9:45 AM

## 2010-03-12 NOTE — Progress Notes (Signed)
Summary: diabetes suppport/dmr  Phone Note Call from Patient Call back at Home Phone [phone]   Caller: Patient Summary of Call: off januvia, increased glipizide- has done nothing but sweat and lowest sugar 150 mg/dl, fastings 160, if she has to go on insullin then she is ready. coming in on the 26th, but not for a pap smear.   wants to discuss her sugars first. wants to go on insulin. asked to see CDE same day as doctor's visit.   Verbalized anxiety problems, lack of support, frequent crying spells, isolation as well.   Discussed local anxiety support groups, calling Katy Fitch for anxiety mental health support and myself for diabetes support- will give her the list of support groups at her visit next week. Initial call taken by: Barnabas Harries,  April 28, 2007 1:54 PM

## 2010-03-12 NOTE — Progress Notes (Signed)
Summary: refill/gg  Phone Note Refill Request  on October 20, 2007 12:15 PM  Refills Requested: Medication #1:  ALPRAZOLAM 0.5 MG  TABS one pill daily as needed for anxiety She does have refills BUT the Rx is only good for 6 months then expires.  She only has 2 pills left.  Initial call taken by: Gevena Cotton RN,  October 20, 2007 12:15 PM  Follow-up for Phone Call        Refill approved-nurse to complete Follow-up by: Lucy Chris MD,  October 21, 2007 9:47 AM  Additional Follow-up for Phone Call Additional follow up Details #1::        Rx called to pharmacy Additional Follow-up by: Gevena Cotton RN,  October 21, 2007 12:07 PM      Prescriptions: ALPRAZOLAM 0.5 MG  TABS (ALPRAZOLAM) one pill daily as needed for anxiety  #100 x 5   Entered and Authorized by:   Lucy Chris MD   Signed by:   Lucy Chris MD on 10/21/2007   Method used:   Telephoned to ...       [facility] (retail)       [address]       [place], [place]  [address]       Ph: [phone]       Fax: [phone]   RxID:   [id number]

## 2010-03-12 NOTE — Progress Notes (Signed)
Summary: appt/ hla  Phone Note Call from Patient   Summary of Call: pt calls and states she has increased her lantus to 40units lantus nightly, sweats day and night also has severe constipation w/ BM about every 4 days and has had tried to disimpact herself several times, desires appt, scheduled 9/29 Initial call taken by: Freddy Finner RN,  November 06, 2009 5:15 PM

## 2010-03-12 NOTE — Progress Notes (Signed)
----  Converted from flag ---- ---- 05/28/2006 9:45 AM, Rhea Pink  DO wrote: Let Ms.Tonette Lederer know her strep test was negative. Thanks! ------------------------------  05/28/06 10:15AM Pt aware of results - feeling better.

## 2010-03-12 NOTE — Assessment & Plan Note (Signed)
Summary: flank pain/gg   Vital Signs:  Patient Profile:   60 Years Old Female Height:     64 inches (162.56 cm) Weight:      177.3 pounds BMI:     30.54 Temp:     97.8 degrees F oral Pulse rate:   84 / minute BP sitting:   102 / 73  (right arm)  Pt. in pain?   yes    Location:   lower back    Intensity:   10    Type:       aching  Vitals Entered By: Silverio Decamp NT II (January 11, 2008 3:48 PM)              Is Patient Diabetic? Yes Did you bring your meter with you today? No Nutritional Status BMI of 25 - 29 = overweight  Have you ever been in a relationship where you felt threatened, hurt or afraid?No   Does patient need assistance? Functional Status Self care Ambulation Normal     PCP:  Lucy Chris MD  Chief Complaint:   RIGHT FLANK PAIN AND NAUSEA FOR 3 DAYS.  History of Present Illness: 60 y/o female with PMH as outlined in the EMR who presents with 3 days of right flank pain.  States that she woke up with the pain and it was intermittent and initially took a vicodin which helped releive it.  However the pain continued to get progressively worse the following day and is currently constant.  Describes it as a dull sensation which is not aggrevated by movement and does not radiate down her legs.  Does admit to a remote history of kidney stones but pain is not associated with urination and she denies any dysuria or hematuria.  States that she has had no recent trauma to the area.  No history of shingles.   Also denies any recent fevers, chills, changes in bowel or bladder habbits, CP or SOB.      Prior Medications Reviewed Using: Patient Recall  Updated Prior Medication List: PRILOSEC OTC 20 MG  TBEC (OMEPRAZOLE MAGNESIUM) Take two tablets by mouth daily ALPRAZOLAM 0.5 MG  TABS (ALPRAZOLAM) one pill daily as needed for anxiety ALBUTEROL 90 MCG/ACT AERS (ALBUTEROL) Two puffs every four hours as needed for shortness of breath HYDROCHLOROTHIAZIDE 25 MG TABS  (HYDROCHLOROTHIAZIDE) Take 1 tablet by mouth once a day SPIRIVA HANDIHALER 18 MCG CAPS (TIOTROPIUM BROMIDE MONOHYDRATE) Inhale contents of 1 capsule once a day CRESTOR 20 MG  TABS (ROSUVASTATIN CALCIUM) Take 1 tablet by mouth once a day ADVAIR DISKUS 250-50 MCG/DOSE MISC (FLUTICASONE-SALMETEROL) 1 puff twice daily GLUCOPHAGE XR 500 MG TB24 (METFORMIN HCL) 2 pills in AM, 2 pills in PM CYMBALTA 30 MG CPEP (DULOXETINE HCL) Take 3  tables by mouth once daily ASPIR-LOW 81 MG TBEC (ASPIRIN)  GLIPIZIDE XL 10 MG  TB24 (GLIPIZIDE) Take 2 tablet by mouth once a day VICODIN 5-500 MG  TABS (HYDROCODONE-ACETAMINOPHEN) Take one tablet by mouth every eight hours as needed for pain LORATADINE 10 MG  TABS (LORATADINE) Take 1 tablet by mouth once a day NYSTATIN 100000 UNIT/GM  CREA (NYSTATIN) Apply to affected area two times a day. FERROUS SULFATE 325 (65 FE) MG  TBEC (FERROUS SULFATE) Take 1 tablet by mouth three times a day TOPAMAX 25 MG  TABS (TOPIRAMATE) Take 1 tablet by mouth twice a day  Current Allergies: ! ULTRAM ! MORPHINE    Risk Factors: Tobacco use:  current    Year started:  AT THE AGE OF 12    Cigarettes:  Yes -- 1.5 pack(s) per day Alcohol use:  no Exercise:  no Seatbelt use:  100 %  Colonoscopy History:    Date of Last Colonoscopy:  08/18/2005  Mammogram History:    Date of Last Mammogram:  08/03/2007  PAP Smear History:    Date of Last PAP Smear:  06/29/2007   Review of Systems      See HPI   Physical Exam  General:     NAD, A&Ox3 Lungs:     CTAB Heart:     RRR, normal S1/S2, No M/R/G Abdomen:     +BS, Soft and non-tender, no masses noted Msk:     Pt has point tenderness along the lateral aspect of right lower rib.  Does not endorse worsening of pain with straight leg lift or flexion/extension/lateral rotation of torso.   Extremities:     No LE edema Skin:     No lesions or rashes noted on back/flank including any contusions or vessicles    Impression &  Recommendations:  Problem # 1:  LOW BACK PAIN (ICD-724.2) Right flank pain concerning for MSK origin esp considering point tenderness on palpation along the right lower rib area.  Pt does have a history of osteoperosis but denies any recent trauma or heavy lifting but wIll order plain films to r/o vertebral compression fracture or rib fracture.  Urine dipstick done in clinic was largely unremarkable except for trace lysed blood so unlikely to be secondary to nephrolithiasis or pyelo but since she does report a remote history of kidney stones, will send urine for formal UA, micro, and culture. Will have pt follow up in 3-4 days to review results and reassess.  No fevers or vessicles on skin to suggest infection or shingles.  Unlikely to be referred pain from abdominal etiology.  This may also be seconday to her fibromyalgia so will continue to her current meds including vicodin as this seems to be helping although she reports not using it very often.    Orders: T-Urinalysis ([id number]) T-Culture, Urine ([id number]) Split Night (Split Night) Diagnostic X-Ray/Fluoroscopy (Diagnostic X-Ray/Flu)  Her updated medication list for this problem includes:    Aspir-low 81 Mg Tbec (Aspirin)    Vicodin 5-500 Mg Tabs (Hydrocodone-acetaminophen) .Marland Kitchen... Take one tablet by mouth every eight hours as needed for pain   Complete Medication List: 1)  Prilosec Otc 20 Mg Tbec (Omeprazole magnesium) .... Take two tablets by mouth daily 2)  Alprazolam 0.5 Mg Tabs (Alprazolam) .... One pill daily as needed for anxiety 3)  Albuterol 90 Mcg/act Aers (Albuterol) .... Two puffs every four hours as needed for shortness of breath 4)  Hydrochlorothiazide 25 Mg Tabs (Hydrochlorothiazide) .... Take 1 tablet by mouth once a day 5)  Spiriva Handihaler 18 Mcg Caps (Tiotropium bromide monohydrate) .... Inhale contents of 1 capsule once a day 6)  Crestor 20 Mg Tabs (Rosuvastatin calcium) .... Take 1 tablet by mouth once a day 7)   Advair Diskus 250-50 Mcg/dose Misc (Fluticasone-salmeterol) .Marland Kitchen.. 1 puff twice daily 8)  Glucophage Xr 500 Mg Tb24 (Metformin hcl) .... 2 pills in am, 2 pills in pm 9)  Cymbalta 30 Mg Cpep (Duloxetine hcl) .... Take 3  tables by mouth once daily 10)  Aspir-low 81 Mg Tbec (Aspirin) 11)  Glipizide Xl 10 Mg Tb24 (Glipizide) .... Take 2 tablet by mouth once a day 12)  Vicodin 5-500 Mg Tabs (Hydrocodone-acetaminophen) .... Take one tablet by mouth every  eight hours as needed for pain 13)  Loratadine 10 Mg Tabs (Loratadine) .... Take 1 tablet by mouth once a day 14)  Nystatin 100000 Unit/gm Crea (Nystatin) .... Apply to affected area two times a day. 15)  Ferrous Sulfate 325 (65 Fe) Mg Tbec (Ferrous sulfate) .... Take 1 tablet by mouth three times a day 16)  Topamax 25 Mg Tabs (Topiramate) .... Take 1 tablet by mouth twice a day   Patient Instructions: 1)  Please schedule a follow-up appointment in 3 to 5 days.   ]  Vital Signs:  Patient Profile:   60 Years Old Female Height:     64 inches (162.56 cm) Weight:      177.3 pounds BMI:     30.54 Temp:     97.8 degrees F oral Pulse rate:   84 / minute BP sitting:   102 / 73    Location:   lower back    Intensity:   10    Type:       aching             Last PAP Result  Specimen Adequacy: Unsatisfactory for evaluation.  Interpretation/Result:Negative for intraepithelial Lesion or Malignancy.    PapHx   Specimen Adequacy: Unsatisfactory for evaluation.  Interpretation/Result:Negative for intraepithelial Lesion or Malignancy.    (06/29/2007 9:52:43 AM)     Laboratory Results   Urine Tests  Date/Time Received: 01/11/08 _0 :20PM  Routine Urinalysis   Color: yellow Appearance: Hazy Glucose: negative   (Normal Range: Negative) Bilirubin: negative   (Normal Range: Negative) Ketone: negative   (Normal Range: Negative) Spec. Gravity: 1.020   (Normal Range: 1.003-1.035) Blood: trace-lysed   (Normal Range: Negative) pH: 7.0   (Normal  Range: 5.0-8.0) Protein: 30   (Normal Range: Negative) Urobilinogen: 0.2   (Normal Range: 0-1) Nitrite: negative   (Normal Range: Negative) Leukocyte Esterace: negative   (Normal Range: Negative)

## 2010-03-12 NOTE — Progress Notes (Signed)
Summary: syringe Rx/dmr  Phone Note Refill Request Message from:  Patient on May 01, 2008 3:33 PM  Refills Requested: Medication #1:  INSULIN SYRINGE 31G X 5/16" 0.3 ML MISC use to inject lantus insulin once daily. needs syringe Rx   Initial call taken by: Barnabas Harries RD,CDE,  May 01, 2008 3:33 PM  Follow-up for Phone Call        Refill approved Follow-up by: Felicity Pellegrini MD,  May 02, 2008 8:29 AM      Prescriptions: INSULIN SYRINGE 31G X 5/16" 0.3 ML MISC (INSULIN SYRINGE-NEEDLE U-100) use to inject lantus insulin once daily  #100 x 11   Entered and Authorized by:   Felicity Pellegrini MD   Signed by:   Felicity Pellegrini MD on 05/02/2008   Method used:   Electronically to        [facility] [phone]* (retail)       [address]       [place], [place]  [address]       Ph: [phone]       Fax: [phone]   RxID:   [id number]

## 2010-03-12 NOTE — Medication Information (Signed)
Summary: CONTROLLED MEDICATION TREATMENT   CONTROLLED MEDICATION TREATMENT   Imported By: Garlan Fillers 11/13/2009 14:34:32  _____________________________________________________________________  External Attachment:    Type:   Image     Comment:   External Document

## 2010-03-12 NOTE — Progress Notes (Signed)
Summary: Results strept test  Phone Note Outgoing Call   Call placed by: Hilda Blades Ditzler RN,  May 28, 2006 10:17 AM Call placed to: Patient Summary of Call: 05/28/06 10:15AM Pt aware that strept test was neg - feeling better.

## 2010-03-12 NOTE — Assessment & Plan Note (Signed)
Summary: FU VISIT/VS   Vital Signs:  Patient Profile:   60 Years Old Female Height:     64 inches (162.56 cm) Weight:      183.4 pounds (83.36 kg) BMI:     31.59 Temp:     97.3 degrees F (36.28 degrees C) Pulse rate:   91 / minute BP sitting:   134 / 75  (right arm) Cuff size:   regular  Pt. in pain?   yes    Location:   all over    Intensity:   4    Type:       aching  Vitals Entered By: Lucky Rathke (July 29, 2006 11:26 AM)              Is Patient Diabetic? Yes  Nutritional Status normal CBG Result 124  Does patient need assistance? Functional Status Self care Ambulation Normal Comments last A1C 4/16/29008 = 6.1 Patient's meter compared to our meter -Patient [id number] / clinic machine = 124.   PCP:  [name] MD  Chief Complaint:  medication /  pain all over /results / last A1C-05/27/06 =6.1.  History of Present Illness: [name] continues to have postprandial sweating which she states is worse. The episodes are now occurring throughout the day and night and  are worse than when she went through menopause (did that at age 17, was on HRT for < 1 yr at that time).  She is still smoking 1.5 pks daily.  Her workup thus far has included an elevated 24 hr urinary 5-HIAA (8.3, normal is < 6.0). She underwent a subsequent chest CT with contrast which showed no evidence for carcinoid tumore.    She is also interested in increasing her Cymbalta to 30 mg daily in the AM (previously she had insomnia with BID dosing).  Current Allergies: ! ULTRAM ! MORPHINE    Risk Factors:  Tobacco use:  current    Cigarettes:  Yes -- 1.5 pack(s) per day    Counseled to quit/cut down tobacco use:  yes Alcohol use:  no Exercise:  no Seatbelt use:  100 %    Physical Exam  General:     Well-developed,well-nourished,in no acute distress; alert,appropriate and cooperative throughout examination Mouth:     Oral mucosa and oropharynx without lesions or exudates.  Teeth in  good repair. Neck:     No deformities, masses, or tenderness noted. Lungs:     Normal respiratory effort, chest expands symmetrically. Lungs are clear to auscultation, no crackles or wheezes. Heart:     Normal rate and regular rhythm. S1 and S2 normal without gallop, murmur, click, rub or other extra sounds. Abdomen:     Bowel sounds positive,abdomen soft and non-tender without masses, organomegaly or hernias noted.    Impression & Recommendations:  Problem # 1:  FACIAL FLUSHING (ICD-782.62) Given her elevated 5-HIAA and smoking history, I am concerned about carcinoid syndrome.  She has had a normal colonoscopy in Sept 2007 by [name] so I will refer her back to him for EGD consideration.    Problem # 2:  TOBACCO ABUSE (ICD-305.1) Patient is still smoking and has not started chantix. She does not appear ready to quit, despite recommendations and discussion of the possibility of using HRT should her hot flushes prove to be resurgence of menopausal symtpoms (but not if she continues to smoke). The following medications were removed from the medication list:    Chantix 1 Mg Tabs (Varenicline tartrate) .Marland Kitchen... Take 1 tablet  by mouth two times a day for 3 months   Problem # 3:  DIABETES MELLITUS, TYPE I (ICD-250.01) Well controlled on current regimen.  Last HgbA1cwas 6.1 in May Her updated medication list for this problem includes:    Januvia 100 Mg Tabs (Sitagliptin phosphate) .Marland Kitchen... Take 1 tablet by mouth once a day    Glucophage Xr 500 Mg Tb24 (Metformin hcl) .Marland Kitchen... 2 pills in am, 2 pills in pm    Aspir-low 81 Mg Tbec (Aspirin)    Glipizide 5 Mg Tabs (Glipizide) .Marland Kitchen... 1/2 pill twice daily with meals   Problem # 4:  ANXIETY (ICD-300.00) Will increase alprazolam from 0.25 to 0.5 for as needed use for anxiety/insomnia. Her updated medication list for this problem includes:    Alprazolam 0.5 Mg Tabs (Alprazolam) ..... One pill daily as needed for anxiety    Cymbalta 30 Mg Cpep (Duloxetine  hcl) .Marland Kitchen... Take 2  tables by mouth once daily   Problem # 5:  COPD (ICD-496) Currently asympotmatic. Her updated medication list for this problem includes:    Albuterol 90 Mcg/act Aers (Albuterol) .Marland Kitchen... P.r.n wheezing    Spiriva Handihaler 18 Mcg Caps (Tiotropium bromide monohydrate) ..... Inhale contents of 1 capsule once a day    Advair Diskus 250-50 Mcg/dose Misc (Fluticasone-salmeterol) .Marland Kitchen... 1 puff twice daily   Problem # 6:  DEPRESSION (ICD-311) Agree to trial of increased cymbalta dose once daily. Her updated medication list for this problem includes:    Alprazolam 0.5 Mg Tabs (Alprazolam) ..... One pill daily as needed for anxiety    Cymbalta 30 Mg Cpep (Duloxetine hcl) .Marland Kitchen... Take 2  tables by mouth once daily   Medications Added to Medication List This Visit: 1)  Alprazolam 0.5 Mg Tabs (Alprazolam) .... One pill daily as needed for anxiety 2)  Cymbalta 30 Mg Cpep (Duloxetine hcl) .... Take 2  tables by mouth once daily   Patient Instructions: 1)  Please schedule a follow-up appointment in 2 months with Dr. Hilma Favors. 2)  Increase your cymbalta to two tablets in the morning for depression and fibromylagia. 3)  Tobacco is very bad for your health and your loved ones! You Should stop smoking!. 4)  Stop Smoking Tips: Choose a Quit date. Cut down before the Quit date. decide what you will do as a substitute when you feel the urge to smoke(gum,toothpick,exercise). 5)  You need to lose weight. Consider a lower calorie diet and regular exercise.  6)  See your eye doctor yearly to check for diabetic eye damage.   Vital Signs:  Patient Profile:   60 Years Old Female Height:     64 inches (162.56 cm) Weight:      183.4 pounds (83.36 kg) BMI:     31.59 Temp:     97.3 degrees F (36.28 degrees C) Pulse rate:   91 / minute BP sitting:   134 / 75 Cuff size:   regular    Location:   all over    Intensity:   4    Type:       aching             CBG Result 124    Appended  Document: GI REferral    Clinical Lists Changes  Orders: Added new Referral order of Gastroenterology Referral (GI) - Signed

## 2010-03-12 NOTE — Assessment & Plan Note (Signed)
Summary: ACUTE-DRY MOUTH/FACE RED/ NOT FEELING WELL([facility])/CFB   Vital Signs:  Patient profile:   60 year old female Height:      64 inches (162.56 cm) Weight:      169.1 pounds (76.86 kg) BMI:     29.13 Temp:     102.1 degrees F (38.94 degrees C) oral Pulse rate:   106 / minute Resp:     20 per minute BP sitting:   101 / 62  (left arm) Cuff size:   regular  Vitals Entered By: Lucky Rathke NT II (March 26, 2009 1:58 PM)  Serial Vital Signs/Assessments:  Time      Position  BP       Pulse  Resp  Temp     By 7:35 PM             113/64   84     20    98.3  F  Gladys Herbin RN  CC: NOT FEELING WELL / HEADACHE -STUFFY NOSE-SHORE THROAT - TOTAL BODY ACHE(MORE THAN USUAL SINCE AM), Depression Is Patient Diabetic? Yes Did you bring your meter with you today? No Pain Assessment Patient in pain? yes     Location: ALL OVER Intensity:    10+ Type:   ACHING Onset of pain  SINCE THIS MORNING Nutritional Status BMI of 25 - 29 = overweight CBG Result 108  Have you ever been in a relationship where you felt threatened, hurt or afraid?No   Does patient need assistance? Functional Status Self care Ambulation Normal Comments TOTAL BODY ACHE / SORE THROAT / STUFFY NOSE / COUGH-PRODUCTIVE AT TIMES / HEADACHE SINCE THIS MORNING.   Primary Care Provider:  Trinidad Curet MD  CC:  NOT FEELING WELL / HEADACHE -STUFFY NOSE-SHORE THROAT - TOTAL BODY ACHE(MORE THAN USUAL SINCE AM) and Depression.  History of Present Illness: 60 yr old woman with pmhx as described below comes to the clinic complaining of body aches, fever, sore throat, and runny nose since this morning. Associated with coughing. Patient has not eaten or drank anything today. Reports she can't move. No sick contacts. Denies chills.   Depression History:      The patient denies a depressed mood most of the day and a diminished interest in her usual daily activities.         Preventive Screening-Counseling &  Management  Alcohol-Tobacco     Alcohol drinks/day: 0     Smoking Status: current     Smoking Cessation Counseling: yes     Packs/Day: 1.5     Year Started: AT THE AGE OF 12  Caffeine-Diet-Exercise     Does Patient Exercise: no  Problems Prior to Update: 1)  Upper Respiratory Infection, Acute  (ICD-465.9) 2)  Hypertension  (ICD-401.9) 3)  Dyslipidemia  (ICD-272.4) 4)  Hypertriglyceridemia  (ICD-272.1) 5)  Microalbuminuria  (ICD-791.0) 6)  Dental Pain  (ICD-525.9) 7)  Shingles  (ICD-053.9) 8)  Anemia  (ICD-285.9) 9)  Gerd  (ICD-530.81) 10)  Bunion  (ICD-727.1) 11)  Aodm  (ICD-250.00) 12)  Hematochezia  (ICD-578.1) 13)  Arthropathy Nec, Other Spec Site  (ICD-716.88) 14)  Colonic Polyps  (ICD-211.3) 15)  Obstructive Sleep Apnea  (ICD-780.57) 16)  Pulmonary Hypertension  (ICD-416.8) 17)  Osteoporosis  (ICD-733.00) 18)  Depression  (ICD-311) 19)  COPD  (ICD-496) 20)  Fibromyalgia  (ICD-729.1) 21)  Irritable Bowel Syndrome  (ICD-564.1) 22)  Health Maintenance Exam  (ICD-V70.0)  Medications Prior to Update: 1)  Prilosec Otc 20 Mg  Tbec (  Omeprazole Magnesium) .... Take Two Tablets By Mouth Daily 2)  Alprazolam 0.5 Mg  Tabs (Alprazolam) .... One Pill Daily As Needed For Anxiety 3)  Albuterol 90 Mcg/act Aers (Albuterol) .... Two Puffs Every Four Hours As Needed For Shortness of Breath 4)  Hydrochlorothiazide 25 Mg Tabs (Hydrochlorothiazide) .... Take 1 Tablet By Mouth Once A Day 5)  Spiriva Handihaler 18 Mcg Caps (Tiotropium Bromide Monohydrate) .... Inhale Contents of 1 Capsule Once A Day 6)  Crestor 20 Mg  Tabs (Rosuvastatin Calcium) .... Take 1 Tablet By Mouth Once A Day 7)  Advair Diskus 250-50 Mcg/dose Misc (Fluticasone-Salmeterol) .Marland Kitchen.. 1 Puff Twice Daily 8)  Glucophage Xr 500 Mg Xr24h-Tab (Metformin Hcl) .... 2 Pills Twice A Day 9)  Cymbalta 30 Mg Cpep (Duloxetine Hcl) .... Take 3  Tables By Mouth Once Daily 10)  Aspir-Low 81 Mg Tbec (Aspirin) .... Take 1 Tablet By Mouth  Once A Day 11)  Glipizide Xl 10 Mg  Tb24 (Glipizide) .... Take 1 Tablet By Mouth Twice A Day 12)  Hydrocodone-Ibuprofen 7.5-200 Mg Tabs (Hydrocodone-Ibuprofen) .... Take One Tab By Mouth Once Every 6 Hours As Needed For Pain 13)  Lantus 100 Unit/ml Soln (Insulin Glargine) .... Inject 30 Units Subcutaneously Daily and Increase Until Fasting Blood Sugars Are Mostly Less Than 130 Mg/dl 14)  Onetouch Ultra Test  Strp (Glucose Blood) .... Use To Test Blood Sugars Three Times A Day 15)  Insulin Syringe 31g X 5/16" 0.3 Ml Misc (Insulin Syringe-Needle U-100) .... Use To Inject Lantus Insulin Once Daily 16)  Lisinopril 5 Mg Tabs (Lisinopril) .... Take 1 Tablet By Mouth Once A Day 17)  Mucinex 600 Mg Xr12h-Tab (Guaifenesin) .... Take 1 Tablet Twice Daily For 10-14 Days  Current Medications (verified): 1)  Prilosec Otc 20 Mg  Tbec (Omeprazole Magnesium) .... Take Two Tablets By Mouth Daily 2)  Alprazolam 0.5 Mg  Tabs (Alprazolam) .... One Pill Daily As Needed For Anxiety 3)  Albuterol 90 Mcg/act Aers (Albuterol) .... Two Puffs Every Four Hours As Needed For Shortness of Breath 4)  Hydrochlorothiazide 25 Mg Tabs (Hydrochlorothiazide) .... Take 1 Tablet By Mouth Once A Day 5)  Spiriva Handihaler 18 Mcg Caps (Tiotropium Bromide Monohydrate) .... Inhale Contents of 1 Capsule Once A Day 6)  Crestor 20 Mg  Tabs (Rosuvastatin Calcium) .... Take 1 Tablet By Mouth Once A Day 7)  Advair Diskus 250-50 Mcg/dose Misc (Fluticasone-Salmeterol) .Marland Kitchen.. 1 Puff Twice Daily 8)  Glucophage Xr 500 Mg Xr24h-Tab (Metformin Hcl) .... 2 Pills Twice A Day 9)  Cymbalta 30 Mg Cpep (Duloxetine Hcl) .... Take 3  Tables By Mouth Once Daily 10)  Aspir-Low 81 Mg Tbec (Aspirin) .... Take 1 Tablet By Mouth Once A Day 11)  Glipizide Xl 10 Mg  Tb24 (Glipizide) .... Take 1 Tablet By Mouth Twice A Day 12)  Hydrocodone-Ibuprofen 7.5-200 Mg Tabs (Hydrocodone-Ibuprofen) .... Take One Tab By Mouth Once Every 6 Hours As Needed For Pain 13)  Lantus 100  Unit/ml Soln (Insulin Glargine) .... Inject 30 Units Subcutaneously Daily and Increase Until Fasting Blood Sugars Are Mostly Less Than 130 Mg/dl 14)  Onetouch Ultra Test  Strp (Glucose Blood) .... Use To Test Blood Sugars Three Times A Day 15)  Insulin Syringe 31g X 5/16" 0.3 Ml Misc (Insulin Syringe-Needle U-100) .... Use To Inject Lantus Insulin Once Daily 16)  Lisinopril 5 Mg Tabs (Lisinopril) .... Take 1 Tablet By Mouth Once A Day 17)  Mucinex 600 Mg Xr12h-Tab (Guaifenesin) .... Take 1 Tablet Twice  Daily For 10-14 Days  Allergies: 1)  ! Ultram 2)  ! Morphine  Past History:  Past Medical History: Last updated: 03/02/2007 COPD Depression Diabetes mellitus, type I Osteoporosis Anxiety Low back pain Chronic diffuse pain involving the back, arms, and legs  Family History: Last updated: 11/30/2008 No significant family medical history  Social History: Last updated: 12/07/2007 Lives alone.  No EtOH.  No illegal drugs.  Poor relationship with children.    Risk Factors: Alcohol Use: 0 (03/26/2009) Exercise: no (03/26/2009)  Risk Factors: Smoking Status: current (03/26/2009) Packs/Day: 1.5 (03/26/2009)  Family History: Reviewed history from 11/30/2008 and no changes required. No significant family medical history  Social History: Reviewed history from 12/07/2007 and no changes required. Lives alone.  No EtOH.  No illegal drugs.  Poor relationship with children.    Review of Systems       The patient complains of fever, headaches, and muscle weakness.  The patient denies peripheral edema, hemoptysis, abdominal pain, melena, hematochezia, and hematuria.    Physical Exam  General:  ill appearing Mouth:  pharyngeal erythema. no exudates Neck:  supple.   Lungs:  normal respiratory effort, no intercostal retractions, no accessory muscle use, normal breath sounds, no crackles, and no wheezes.   Heart:  normal rate, regular rhythm, no murmur, no gallop, and no rub.    Abdomen:  soft, non-tender, normal bowel sounds, and no distention.   Msk:  normal ROM.   Extremities:  No clubbing, cyanosis, edema, or deformity noted with normal full range of motion of all joints.   Neurologic:  alert & oriented X3, strength normal in all extremities, and gait normal.     Impression & Recommendations:  Problem # 1:  UPPER RESPIRATORY INFECTION, ACUTE (ICD-465.9) Flu like symptoms. Patient has been unable to tolerate oral intake, ill appearing. Will admit. Further management per primary team.  Her updated medication list for this problem includes:    Aspir-low 81 Mg Tbec (Aspirin) .Marland Kitchen... Take 1 tablet by mouth once a day    Mucinex 600 Mg Xr12h-tab (Guaifenesin) .Marland Kitchen... Take 1 tablet twice daily for 10-14 days  Orders: Ibuprofen 266m tab (EMRORAL) 0.9% NS 10064m(EMRZERO)  Problem # 2:  HYPERTENSION (ICD-401.9) Soft. Will hold antihypertensives.  Her updated medication list for this problem includes:    Hydrochlorothiazide 25 Mg Tabs (Hydrochlorothiazide) ...Marland Kitchen. Take 1 tablet by mouth once a day    Lisinopril 5 Mg Tabs (Lisinopril) ...Marland Kitchen. Take 1 tablet by mouth once a day  BP today: 101/62 Prior BP: 121/69 (03/22/2009)  Labs Reviewed: K+: 4.4 (03/22/2009) Creat: : 0.85 (03/22/2009)   Chol: 111 (03/22/2009)   HDL: 34 (03/22/2009)   LDL: 45 (03/22/2009)   TG: 159 (03/22/2009)  Problem # 3:  AODM (ICD-250.00) Per primary team.  Her updated medication list for this problem includes:    Glucophage Xr 500 Mg Xr24h-tab (Metformin hcl) ...Marland Kitchen. 2 pills twice a day    Aspir-low 81 Mg Tbec (Aspirin) ...Marland Kitchen. Take 1 tablet by mouth once a day    Glipizide Xl 10 Mg Tb24 (Glipizide) ...Marland Kitchen. Take 1 tablet by mouth twice a day    Lantus 100 Unit/ml Soln (Insulin glargine) ..... Inject 30 units subcutaneously daily and increase until fasting blood sugars are mostly less than 130 mg/dl    Lisinopril 5 Mg Tabs (Lisinopril) ...Marland Kitchen. Take 1 tablet by mouth once a day  Labs  Reviewed: Creat: 0.85 (03/22/2009)     Last Eye Exam: No diabetic retinopathy.    (09/09/2007)  Reviewed HgBA1c results: 6.1 (03/22/2009)  5.8 (11/15/2008)  Problem # 4:  COPD (ICD-496) Stable. Continue current regimen.  Her updated medication list for this problem includes:    Albuterol 90 Mcg/act Aers (Albuterol) .Marland Kitchen..Marland Kitchen Two puffs every four hours as needed for shortness of breath    Spiriva Handihaler 18 Mcg Caps (Tiotropium bromide monohydrate) ..... Inhale contents of 1 capsule once a day    Advair Diskus 250-50 Mcg/dose Misc (Fluticasone-salmeterol) .Marland Kitchen... 1 puff twice daily  Complete Medication List: 1)  Prilosec Otc 20 Mg Tbec (Omeprazole magnesium) .... Take two tablets by mouth daily 2)  Alprazolam 0.5 Mg Tabs (Alprazolam) .... One pill daily as needed for anxiety 3)  Albuterol 90 Mcg/act Aers (Albuterol) .... Two puffs every four hours as needed for shortness of breath 4)  Hydrochlorothiazide 25 Mg Tabs (Hydrochlorothiazide) .... Take 1 tablet by mouth once a day 5)  Spiriva Handihaler 18 Mcg Caps (Tiotropium bromide monohydrate) .... Inhale contents of 1 capsule once a day 6)  Crestor 20 Mg Tabs (Rosuvastatin calcium) .... Take 1 tablet by mouth once a day 7)  Advair Diskus 250-50 Mcg/dose Misc (Fluticasone-salmeterol) .Marland Kitchen.. 1 puff twice daily 8)  Glucophage Xr 500 Mg Xr24h-tab (Metformin hcl) .... 2 pills twice a day 9)  Cymbalta 30 Mg Cpep (Duloxetine hcl) .... Take 3  tables by mouth once daily 10)  Aspir-low 81 Mg Tbec (Aspirin) .... Take 1 tablet by mouth once a day 11)  Glipizide Xl 10 Mg Tb24 (Glipizide) .... Take 1 tablet by mouth twice a day 12)  Hydrocodone-ibuprofen 7.5-200 Mg Tabs (Hydrocodone-ibuprofen) .... Take one tab by mouth once every 6 hours as needed for pain 13)  Lantus 100 Unit/ml Soln (Insulin glargine) .... Inject 30 units subcutaneously daily and increase until fasting blood sugars are mostly less than 130 mg/dl 14)  Onetouch Ultra Test Strp (Glucose  blood) .... Use to test blood sugars three times a day 15)  Insulin Syringe 31g X 5/16" 0.3 Ml Misc (Insulin syringe-needle u-100) .... Use to inject lantus insulin once daily 16)  Lisinopril 5 Mg Tabs (Lisinopril) .... Take 1 tablet by mouth once a day 17)  Mucinex 600 Mg Xr12h-tab (Guaifenesin) .... Take 1 tablet twice daily for 10-14 days  Other Orders: Capillary Blood Glucose/CBG (00938)   Prevention & Chronic Care Immunizations   Influenza vaccine: Historical  (10/23/2008)   Influenza vaccine deferral: Deferred  (03/22/2009)   Influenza vaccine due: 11/27/2006    Tetanus booster: 10/19/2006: Td   Td booster deferral: Not indicated  (03/22/2009)   Tetanus booster due: 10/18/2016    Pneumococcal vaccine: Not documented  Colorectal Screening   Hemoccult: Not documented   Hemoccult action/deferral: Deferred  (11/15/2008)    Colonoscopy: normal  (08/18/2005)   Colonoscopy action/deferral: Not indicated  (03/22/2009)   Colonoscopy due: 08/19/2015  Other Screening   Pap smear: NEGATIVE FOR INTRAEPITHELIAL LESIONS OR MALIGNANCY.  (12/01/2008)   Pap smear action/deferral: GYN Referral  (11/15/2008)   Pap smear due: 12/2011    Mammogram: Assessment: BIRADS 1.   (08/03/2007)   Mammogram action/deferral: Not indicated  (03/22/2009)   Mammogram due: 08/02/2008   Smoking status: current  (03/26/2009)   Smoking cessation counseling: yes  (03/26/2009)  Diabetes Mellitus   HgbA1C: 6.1  (03/22/2009)   HgbA1C action/deferral: Ordered  (11/15/2008)   Hemoglobin A1C due: 08/26/2006    Eye exam: No diabetic retinopathy.     (09/09/2007)   Diabetic eye exam action/deferral: Deferred  (03/22/2009)   Eye exam  due: 09/2008    Foot exam: yes  (11/30/2008)   High risk foot: Not documented   Foot care education: Not documented    Urine microalbumin/creatinine ratio: 39.3  (11/15/2008)   Urine microalbumin action/deferral: Ordered   Urine microalbumin/cr due: 08/26/2006     Diabetes flowsheet reviewed?: Yes   Progress toward A1C goal: At goal  Lipids   Total Cholesterol: 111  (03/22/2009)   Lipid panel action/deferral: Lipid Panel ordered   LDL: 45  (03/22/2009)   LDL Direct: Not documented   HDL: 34  (03/22/2009)   Triglycerides: 159  (03/22/2009)   Lipid panel due: 11/27/2006    SGOT (AST): 13  (03/22/2009)   SGPT (ALT): 15  (03/22/2009)   Alkaline phosphatase: 44  (03/22/2009)   Total bilirubin: 0.4  (03/22/2009)    Lipid flowsheet reviewed?: Yes   Progress toward LDL goal: At goal  Hypertension   Last Blood Pressure: 101 / 62  (03/26/2009)   Serum creatinine: 0.85  (03/22/2009)   Serum potassium 4.4  (03/22/2009)    Hypertension flowsheet reviewed?: Yes   Progress toward BP goal: At goal  Self-Management Support :   Personal Goals (by the next clinic visit) :     Personal A1C goal: 7  (03/22/2009)     Personal blood pressure goal: 130/80  (03/22/2009)     Personal LDL goal: 100  (03/22/2009)    Patient will work on the following items until the next clinic visit to reach self-care goals:     Medications and monitoring: take my medicines every day, check my blood sugar, bring all of my medications to every visit, examine my feet every day  (03/26/2009)     Eating: drink diet soda or water instead of juice or soda, eat more vegetables, use fresh or frozen vegetables, eat foods that are low in salt, eat baked foods instead of fried foods, eat fruit for snacks and desserts, limit or avoid alcohol  (03/26/2009)     Activity: take a 30 minute walk every day  (03/22/2009)    Diabetes self-management support: Written self-care plan  (03/26/2009)   Diabetes care plan printed   Last diabetes self-management training by diabetes educator: 05/01/2008    Hypertension self-management support: Written self-care plan  (03/26/2009)   Hypertension self-care plan printed.    Lipid self-management support: Written self-care plan  (03/26/2009)   Lipid  self-care plan printed.   Nursing Instructions: Give advil 271m  two tablet by mouth now    Medication Administration  Infusion # 1:    Diagnosis: UPPER RESPIRATORY INFECTION, ACUTE (ICD-465.9)    Started: 1600    Stopped: 1710    Solution: 0.9% NS 10079m   Instructions: Infuse over 1 hour    Route: IV infusion    Site: right forearm    Ordered by: Dr. VeWendee Beavers  Administered by: ShYvonna AlanisN-March 26, 2009 7:58 PM    Comments: IV started on first attempt by this RN with 22 G 1 In. IW - excellent blood return and dressing dry and in tact; previous attempts 2X in left arm - pt strongly requests back of hand not be used for IV    Patient tolerated infusion without complications  Infusion # 2:    Diagnosis: UPPER RESPIRATORY INFECTION, ACUTE (ICD-465.9)    Started: 1710    Stopped: 1815    Solution: 150 CC NS    Instructions: 100 cc over  1 hour    Route: IV infusion  Site: right forearm    Ordered by: Dr. Wendee Beavers    Administered by: Yvonna Alanis RN-March 26, 2009 8:01 PM  Infusion # 3:    Diagnosis: UPPER RESPIRATORY INFECTION, ACUTE (ICD-465.9)    Started: 1815    Solution: 0.9% NS 1011m    Instructions: 100 cc/hr    Route: IV infusion    Site: right forearm    Ordered by: Dr. VWendee Beavers   Administered by: SYvonna AlanisRN-March 26, 2009 8:02 PM  Medication # 1:    Medication: Ibuprofen 201mtab    Diagnosis: UPPER RESPIRATORY INFECTION, ACUTE (ICD-465.9)    Dose: 2 tablets    Route: po    Exp Date: 09/11/2010    Lot #: [id number]    Mfr: Major Phyam.    Patient tolerated medication without complications    Given by: DeLorne SkeensN (March 26, 2009 3:05 PM)  Orders Added: 1)  Capillary Blood Glucose/CBG [82948] 2)  Ibuprofen 20025mab [EMRORAL] 3)  0.9% NS 1000m9mMRZERO]   Medication Administration  Infusion # 1:    Diagnosis: UPPER RESPIRATORY INFECTION, ACUTE (ICD-465.9)    Started: 1600    Stopped: 1710    Solution: 0.9% NS 1000ml32m Instructions: Infuse over 1 hour    Route: IV infusion    Site: right forearm    Ordered by: Dr. Vega Wendee Beaversdministered by: SharoYvonna Alanisebruary 14, 2011 7:58 PM    Comments: IV started on first attempt by this RN with 22 G 1 In. IW - excellent blood return and dressing dry and in tact; previous attempts 2X in left arm - pt strongly requests back of hand not be used for IV    Patient tolerated infusion without complications  Infusion # 2:    Diagnosis: UPPER RESPIRATORY INFECTION, ACUTE (ICD-465.9)    Started: 1710    Stopped: 1815    Solution: 150 CC NS    Instructions: 100 cc over  1 hour    Route: IV infusion    Site: right forearm    Ordered by: Dr. Vega Wendee Beaversdministered by: SharoYvonna Alanisebruary 14, 2011 8:01 PM  Infusion # 3:    Diagnosis: UPPER RESPIRATORY INFECTION, ACUTE (ICD-465.9)    Started: 1815    Solution: 0.9% NS 1000ml 63mnstructions: 100 cc/hr    Route: IV infusion    Site: right forearm    Ordered by: Dr. Vega  Wendee Beaversministered by: SharonYvonna Alanisbruary 14, 2011 8:02 PM  Medication # 1:    Medication: Ibuprofen 200mg t98m  Diagnosis: UPPER RESPIRATORY INFECTION, ACUTE (ICD-465.9)    Dose: 2 tablets    Route: po    Exp Date: 09/11/2010    Lot #: [id number]    Mfr: Major Phyam.    Patient tolerated medication without complications    Given by: Denise Lorne Skeensbruary 14, 2011 3:05 PM)  Orders Added: 1)  Capillary Blood Glucose/CBG [82948] 2)  Ibuprofen 200mg ta48mMRORAL] 3)  0.9% NS 1000ml [EM49mO] Pt has ambulated  X 2 this pm  with minimal assistance.  Report called to Nurse on 5500.  Pt transported via wheel chair to 5508. Gladys HeSander Nephewuary 14, 2011 8:16 PM

## 2010-03-12 NOTE — Progress Notes (Signed)
Summary: refill/ hla  *  Phone Note Refill Request Message from:  Fax from Pharmacy on August 29, 2008 3:10 PM  Refills Requested: Medication #1:  PRILOSEC OTC 20 MG  TBEC Take two tablets by mouth daily   Last Refilled: 6/24 *pt is out  Initial call taken by: Freddy Finner RN,  August 29, 2008 3:10 PM  Follow-up for Phone Call        completed Follow-up by: Trinidad Curet MD,  August 30, 2008 12:51 PM    Prescriptions: PRILOSEC OTC 20 MG  TBEC (OMEPRAZOLE MAGNESIUM) Take two tablets by mouth daily  #62 x 5   Entered and Authorized by:   Trinidad Curet MD   Signed by:   Trinidad Curet MD on 08/30/2008   Method used:   Electronically to        [facility] [phone]* (retail)       [address]       [place], [place]  [address]       Ph: [phone]       Fax: [phone]   RxID:   [id number]

## 2010-03-12 NOTE — Progress Notes (Signed)
Summary: med refill/gp  Phone Note Refill Request Message from:  Fax from Pharmacy on December 12, 2009 11:57 AM  Refills Requested: Medication #1:  COLACE 100 MG CAPS Take 1 tablet by mouth two times a day  Medication #2:  NORCO 10-325 MG TABS Take 1 tablet by mouth four times a day as needed pain  Medication #3:  PRILOSEC 40 MG CPDR Take 1 tablet by mouth once a day Last appt. 11/23/09.   Method Requested: Fax to [facility] Initial call taken by: Morrison Old RN,  December 12, 2009 11:57 AM  Follow-up for Phone Call        completed refill, thank you, except norco, she is approved for 120 tablets but I am not in the hospital so can I please have [facility] attending do that for me if possible, thank you Iskra  Follow-up by: Trinidad Curet MD,  December 12, 2009 1:17 PM    Prescriptions: NORCO 10-325 MG TABS (HYDROCODONE-ACETAMINOPHEN) Take 1 tablet by mouth four times a day as needed pain  #120 x 0   Entered and Authorized by:   Trinidad Curet MD   Signed by:   Trinidad Curet MD on 12/12/2009   Method used:   Handwritten   RxID:   [id number] COLACE 100 MG CAPS (DOCUSATE SODIUM) Take 1 tablet by mouth two times a day  #60 x 7   Entered and Authorized by:   Trinidad Curet MD   Signed by:   Trinidad Curet MD on 12/12/2009   Method used:   Faxed to ...       [facility] (mail-order)       [address] [place], [place]  [address]       Ph: [phone]       Fax: [phone]   RxID:   [id number] PRILOSEC 40 MG CPDR (OMEPRAZOLE) Take 1 tablet by mouth once a day  #30 x 7   Entered and Authorized by:   Trinidad Curet MD   Signed by:   Trinidad Curet MD on 12/12/2009   Method used:   Faxed to ...       [facility] (mail-order)       [address] [place], [place]  [address]       Ph: [phone]       Fax: [phone]   [place]:   [phone]

## 2010-03-12 NOTE — Assessment & Plan Note (Signed)
Summary: EST-1 MONTH F/U VISIT/CH   Vital Signs:  Patient profile:   60 year old female Height:      64 inches (162.56 cm) Weight:      170.9 pounds (77.68 kg) BMI:     29.44 O2 Sat:      98 % on Room air Temp:     98.1 degrees F (36.72 degrees C) oral Pulse rate:   95 / minute BP sitting:   102 / 56  (right arm)  Vitals Entered By: Hilda Blades Ditzler RN (August 28, 2009 2:00 PM)  O2 Flow:  Room air Is Patient Diabetic? Yes Did you bring your meter with you today? Yes Pain Assessment Patient in pain? yes     Location: right upper abd and legs Intensity: 6 Type: aching Onset of pain  past 3 days Nutritional Status BMI of 25 - 29 = overweight Nutritional Status Detail appetite down  Have you ever been in a relationship where you felt threatened, hurt or afraid?denies   Does patient need assistance? Functional Status Self care Ambulation Normal Comments SOB since last visit and clear productive cough. Problems with stomach. Discuss breathing med and sleep study. Discuss leg pain and right upper abd. Concerned about lung ca.   Primary Care Provider:  Trinidad Curet MD   History of Present Illness: 60 yo female with PMH outlined below presents to [place] with main concern of occasional right upper quadrant pain. She has been having it since about 2 weeks ago and it comes on and off, it is sharp, can last up to 30 minutes and it goes away on its own, aggrevated with food, relieved with pain medicine. She has had similar problem in the past and has had work up for gallbladder stones but the results were all negative. She denies any fever, chills, nausea or vomiting. She has no abdominal or urinary concerns. Her shortness of breath is chronic and there has been no change in the pattern. She continues to smoke 1 and 1/2 pack per day. She also reports worsening acid reflux, she can feel acid taste in her mouth. She denies any syncopal episodes, no recent sick contacts or exposures. She would  like to see her cardiologist and GI doctor [name].   Depression History:      The patient denies a depressed mood most of the day and a diminished interest in her usual daily activities.  The patient denies significant weight loss, significant weight gain, insomnia, hypersomnia, psychomotor agitation, psychomotor retardation, fatigue (loss of energy), feelings of worthlessness (guilt), impaired concentration (indecisiveness), and recurrent thoughts of death or suicide.        The patient denies that she feels like life is not worth living, denies that she wishes that she were dead, and denies that she has thought about ending her life.         Preventive Screening-Counseling & Management  Alcohol-Tobacco     Alcohol drinks/day: 0     Smoking Status: current     Smoking Cessation Counseling: yes     Packs/Day: 1.5     Year Started: AT THE AGE OF 12  Caffeine-Diet-Exercise     Does Patient Exercise: no  Problems Prior to Update: 1)  Hypertension  (ICD-401.9) 2)  Microalbuminuria  (ICD-791.0) 3)  Dyslipidemia  (ICD-272.4) 4)  Hypertriglyceridemia  (ICD-272.1) 5)  Dental Pain  (ICD-525.9) 6)  Shingles  (ICD-053.9) 7)  Anemia  (ICD-285.9) 8)  Arthropathy Nec, Other Spec Site  (ICD-716.88) 9)  Aodm  (ICD-250.00) 10)  Colonic Polyps  (ICD-211.3) 11)  Pulmonary Hypertension  (ICD-416.8) 12)  Osteoporosis  (ICD-733.00) 13)  COPD  (ICD-496) 14)  Obstructive Sleep Apnea  (ICD-780.57) 15)  Depression  (ICD-311) 16)  Fibromyalgia  (ICD-729.1) 17)  Irritable Bowel Syndrome  (ICD-564.1) 18)  Gerd  (ICD-530.81)  Medications Prior to Update: 1)  Prilosec 40 Mg Cpdr (Omeprazole) .... Take 1 Tablet By Mouth Once A Day 2)  Alprazolam 0.5 Mg  Tabs (Alprazolam) .... One Pill Daily As Needed For Anxiety 3)  Albuterol 90 Mcg/act Aers (Albuterol) .... Two Puffs Every Four Hours As Needed For Shortness of Breath 4)  Hydrochlorothiazide 25 Mg Tabs (Hydrochlorothiazide) .... Take 1 Tablet By Mouth  Once A Day 5)  Spiriva Handihaler 18 Mcg Caps (Tiotropium Bromide Monohydrate) .... Inhale Contents of 1 Capsule Once A Day 6)  Crestor 20 Mg  Tabs (Rosuvastatin Calcium) .... Take 1 Tablet By Mouth Once A Day 7)  Advair Diskus 250-50 Mcg/dose Misc (Fluticasone-Salmeterol) .Marland Kitchen.. 1 Puff Twice Daily 8)  Glucophage Xr 500 Mg Xr24h-Tab (Metformin Hcl) .... 2 Pills Twice A Day 9)  Cymbalta 30 Mg Cpep (Duloxetine Hcl) .... Take 3  Tables By Mouth Once Daily 10)  Aspir-Low 81 Mg Tbec (Aspirin) .... Take 1 Tablet By Mouth Once A Day 11)  Glipizide Xl 10 Mg  Tb24 (Glipizide) .... Take 1 Tablet By Mouth Twice A Day 12)  Hydrocodone-Ibuprofen 7.5-200 Mg Tabs (Hydrocodone-Ibuprofen) .... Take One Tab By Mouth Once Every 6 Hours As Needed For Pain 13)  Lantus 100 Unit/ml Soln (Insulin Glargine) .... Inject 34 Units Subcutaneously Daily and Increase Until Fasting Blood Sugars Are Mostly Less Than 130 Mg/dl 14)  Onetouch Ultra Test  Strp (Glucose Blood) .... Use To Test Blood Sugars Three Times A Day 15)  Insulin Syringe 31g X 5/16" 0.3 Ml Misc (Insulin Syringe-Needle U-100) .... Use To Inject Lantus Insulin Once Daily 16)  Lisinopril 5 Mg Tabs (Lisinopril) .... Take 1 Tablet By Mouth Once A Day 17)  Mucinex 600 Mg Xr12h-Tab (Guaifenesin) .... Take 1 Tablet Twice Daily For 10-14 Days 18)  Nystatin 100000 Unit/gm Crea (Nystatin) .... Apply To The Affected Area 3-4 Times Daily As Needed 19)  Loratadine 10 Mg Tabs (Loratadine) .... Take 1 Tablet By Mouth Once A Day 20)  Wellbutrin Xl 150 Mg Xr24h-Tab (Bupropion Hcl) .... Take 1 Tablet Once Daily 21)  Cvs Diabetic Organizer  Misc (Misc. Devices) .... Please Provide Patient With Diabetic Shoes 22)  Diabetic Shoes With Arch Inserts .... One Pair Dx: 250.00 High Risk Foot 23)  Promethazine Hcl 12.5 Mg Tabs (Promethazine Hcl) .... Take 1 Pill By Mouth Three Times A Day As Needed For Nausea and Vomiting.  Current Medications (verified): 1)  Prilosec 40 Mg Cpdr  (Omeprazole) .... Take 1 Tablet By Mouth Once A Day 2)  Alprazolam 0.5 Mg  Tabs (Alprazolam) .... One Pill Daily As Needed For Anxiety 3)  Albuterol 90 Mcg/act Aers (Albuterol) .... Two Puffs Every Four Hours As Needed For Shortness of Breath 4)  Hydrochlorothiazide 25 Mg Tabs (Hydrochlorothiazide) .... Take 1 Tablet By Mouth Once A Day 5)  Spiriva Handihaler 18 Mcg Caps (Tiotropium Bromide Monohydrate) .... Inhale Contents of 1 Capsule Once A Day 6)  Crestor 20 Mg  Tabs (Rosuvastatin Calcium) .... Take 1 Tablet By Mouth Once A Day 7)  Advair Diskus 250-50 Mcg/dose Misc (Fluticasone-Salmeterol) .Marland Kitchen.. 1 Puff Twice Daily 8)  Glucophage Xr 500 Mg Xr24h-Tab (Metformin Hcl) .... 2  Pills Twice A Day 9)  Cymbalta 30 Mg Cpep (Duloxetine Hcl) .... Take 3  Tables By Mouth Once Daily 10)  Aspir-Low 81 Mg Tbec (Aspirin) .... Take 1 Tablet By Mouth Once A Day 11)  Glipizide Xl 10 Mg  Tb24 (Glipizide) .... Take 1 Tablet By Mouth Twice A Day 12)  Hydrocodone-Ibuprofen 7.5-200 Mg Tabs (Hydrocodone-Ibuprofen) .... Take One Tab By Mouth Once Every 6 Hours As Needed For Pain 13)  Lantus 100 Unit/ml Soln (Insulin Glargine) .... Inject 34 Units Subcutaneously Daily and Increase Until Fasting Blood Sugars Are Mostly Less Than 130 Mg/dl 14)  Onetouch Ultra Test  Strp (Glucose Blood) .... Use To Test Blood Sugars Three Times A Day 15)  Insulin Syringe 31g X 5/16" 0.3 Ml Misc (Insulin Syringe-Needle U-100) .... Use To Inject Lantus Insulin Once Daily 16)  Lisinopril 5 Mg Tabs (Lisinopril) .... Take 1 Tablet By Mouth Once A Day 17)  Mucinex 600 Mg Xr12h-Tab (Guaifenesin) .... Take 1 Tablet Twice Daily For 10-14 Days 18)  Nystatin 100000 Unit/gm Crea (Nystatin) .... Apply To The Affected Area 3-4 Times Daily As Needed 19)  Loratadine 10 Mg Tabs (Loratadine) .... Take 1 Tablet By Mouth Once A Day 20)  Wellbutrin Xl 150 Mg Xr24h-Tab (Bupropion Hcl) .... Take 1 Tablet Once Daily 21)  Cvs Diabetic Organizer  Misc (Misc.  Devices) .... Please Provide Patient With Diabetic Shoes 22)  Diabetic Shoes With Arch Inserts .... One Pair Dx: 250.00 High Risk Foot 23)  Promethazine Hcl 12.5 Mg Tabs (Promethazine Hcl) .... Take 1 Pill By Mouth Three Times A Day As Needed For Nausea and Vomiting.  Allergies: 1)  ! Ultram 2)  ! Morphine  Past History:  Past Medical History: Last updated: 03/02/2007 COPD Depression Diabetes mellitus, type I Osteoporosis Anxiety Low back pain Chronic diffuse pain involving the back, arms, and legs  Family History: Last updated: 11/30/2008 No significant family medical history  Social History: Last updated: 12/07/2007 Lives alone.  No EtOH.  No illegal drugs.  Poor relationship with children.    Risk Factors: Alcohol Use: 0 (08/28/2009) Exercise: no (08/28/2009)  Risk Factors: Smoking Status: current (08/28/2009) Packs/Day: 1.5 (08/28/2009)  Family History: Reviewed history from 11/30/2008 and no changes required. No significant family medical history  Social History: Reviewed history from 12/07/2007 and no changes required. Lives alone.  No EtOH.  No illegal drugs.  Poor relationship with children.    Review of Systems  The patient denies anorexia, fever, weight loss, weight gain, chest pain, syncope, peripheral edema, hemoptysis, melena, hematochezia, hematuria, incontinence, muscle weakness, suspicious skin lesions, difficulty walking, and depression.    Physical Exam  General:  Well-developed,well-nourished,in no acute distress; alert,appropriate and cooperative throughout examination Mouth:  no dental plaque, pharynx pink and moist, no postnasal drip, no pharyngeal crowing, and poor dentition.   Chest Wall:  No deformities, masses, or tenderness noted. Lungs:  normal respiratory effort, no intercostal retractions, no accessory muscle use, normal breath sounds, no dullness, no crackles, and no wheezes.   Heart:  normal rate, regular rhythm, no murmur, no  gallop, and no rub.   Abdomen:  soft and normal bowel sounds, right upper quadrant tenderness, no guarding and no rigidity, no distension Msk:  No deformity or scoliosis noted of thoracic or lumbar spine.   Extremities:  No clubbing, cyanosis, edema, or deformity noted with normal full range of motion of all joints.   Neurologic:  No cranial nerve deficits noted. Station and gait are normal. Plantar  reflexes are down-going bilaterally. DTRs are symmetrical throughout. Sensory, motor and coordinative functions appear intact.   Impression & Recommendations:  Problem # 1:  HYPERTENSION (ICD-401.9) Her Bp has been well controlled and we started low dose lisinopril on her last visit due to proteinuria, I will stop HCTZ for now and will continue to monitor her BP on lisinopril alone. Will readjust the regimen as indicated.  The following medications were removed from the medication list:    Hydrochlorothiazide 25 Mg Tabs (Hydrochlorothiazide) .Marland Kitchen... Take 1 tablet by mouth once a day Her updated medication list for this problem includes:    Lisinopril 5 Mg Tabs (Lisinopril) .Marland Kitchen... Take 1 tablet by mouth once a day  BP today: 102/56 Prior BP: 117/67 (07/25/2009)  Labs Reviewed: K+: 4.4 (03/22/2009) Creat: : 0.85 (03/22/2009)   Chol: 111 (03/22/2009)   HDL: 34 (03/22/2009)   LDL: 45 (03/22/2009)   TG: 159 (03/22/2009)  Problem # 2:  DYSLIPIDEMIA (ICD-272.4) No significant changes in cholesterol. Will continue the same regimen.  Her updated medication list for this problem includes:    Crestor 20 Mg Tabs (Rosuvastatin calcium) .Marland Kitchen... Take 1 tablet by mouth once a day  Labs Reviewed: SGOT: 13 (03/22/2009)   SGPT: 15 (03/22/2009)   HDL:34 (03/22/2009), 38 (11/15/2008)  LDL:45 (03/22/2009), 46 (11/15/2008)  Chol:111 (03/22/2009), 108 (11/15/2008)  Trig:159 (03/22/2009), 122 (11/15/2008)  Problem # 3:  RUQ PAIN (ICD-789.01)  Questionable gallbladder etiology, stones/inflammation, will send for  ultrasound, patient adived to avoid fatty meals and to drink plenty of fluids. Cont painmedications as perscribed previously. Will follow up on [place] and will call the patient with the results. Other etiologies such as pancreatitis, cardiac, possible. Will obtain stat CMET and lipase with cardiac enzymes and 12 lead ekg. She is hemodynamically stable at teh moment so I do not think this necessitates an admission and can be worked up in an outpatient setting. There is a possibility of worsening GERD as well since she has been off her PPIs, I have encouraged her to cont her PPI, avoid meals before sleep. I have arranged follow up appointment with [name] per patient's request ( August 1st, 2011), will follow up on recommendations. Appointment with cardiologist scheduled on July 27th, 2011.   Orders: Ultrasound (Ultrasound) Radiology other (Radiology Other) T-Comprehensive Metabolic Panel ([id number]) T-CBC No Diff ([id number]) T-Lipase ([id number]) T-CK MB Fract ([id number]) T-Troponin I ([id number]) 12 Lead EKG (12 Lead EKG)  Discussed symptom control with the patient.   Problem # 4:  COPD (ICD-496) No changes in her baseline status.  Her updated medication list for this problem includes:    Albuterol 90 Mcg/act Aers (Albuterol) .Marland Kitchen..Marland Kitchen Two puffs every four hours as needed for shortness of breath    Spiriva Handihaler 18 Mcg Caps (Tiotropium bromide monohydrate) ..... Inhale contents of 1 capsule once a day    Advair Diskus 250-50 Mcg/dose Misc (Fluticasone-salmeterol) .Marland Kitchen... 1 puff twice daily  Pulmonary Functions Reviewed: O2 sat: 98 (08/28/2009)     Vaccines Reviewed: Flu Vax: Historical (10/23/2008)  Complete Medication List: 1)  Prilosec 40 Mg Cpdr (Omeprazole) .... Take 1 tablet by mouth once a day 2)  Alprazolam 0.5 Mg Tabs (Alprazolam) .... One pill daily as needed for anxiety 3)  Albuterol 90 Mcg/act Aers (Albuterol) .... Two puffs every four hours as needed for shortness of  breath 4)  Spiriva Handihaler 18 Mcg Caps (Tiotropium bromide monohydrate) .... Inhale contents of 1 capsule once a day 5)  Crestor 20 Mg Tabs (Rosuvastatin calcium) .Marland KitchenMarland KitchenMarland Kitchen  Take 1 tablet by mouth once a day 6)  Advair Diskus 250-50 Mcg/dose Misc (Fluticasone-salmeterol) .Marland Kitchen.. 1 puff twice daily 7)  Glucophage Xr 500 Mg Xr24h-tab (Metformin hcl) .... 2 pills twice a day 8)  Cymbalta 30 Mg Cpep (Duloxetine hcl) .... Take 3  tables by mouth once daily 9)  Aspir-low 81 Mg Tbec (Aspirin) .... Take 1 tablet by mouth once a day 10)  Glipizide Xl 10 Mg Tb24 (Glipizide) .... Take 1 tablet by mouth twice a day 11)  Hydrocodone-ibuprofen 7.5-200 Mg Tabs (Hydrocodone-ibuprofen) .... Take one tab by mouth once every 6 hours as needed for pain 12)  Lantus 100 Unit/ml Soln (Insulin glargine) .... Inject 34 units subcutaneously daily and increase until fasting blood sugars are mostly less than 130 mg/dl 13)  Onetouch Ultra Test Strp (Glucose blood) .... Use to test blood sugars three times a day 14)  Insulin Syringe 31g X 5/16" 0.3 Ml Misc (Insulin syringe-needle u-100) .... Use to inject lantus insulin once daily 15)  Lisinopril 5 Mg Tabs (Lisinopril) .... Take 1 tablet by mouth once a day 16)  Nystatin 100000 Unit/gm Crea (Nystatin) .... Apply to the affected area 3-4 times daily as needed 17)  Loratadine 10 Mg Tabs (Loratadine) .... Take 1 tablet by mouth once a day 18)  Wellbutrin Xl 150 Mg Xr24h-tab (Bupropion hcl) .... Take 1 tablet once daily 19)  Promethazine Hcl 12.5 Mg Tabs (Promethazine hcl) .... Take 1 pill by mouth three times a day as needed for nausea and vomiting.  Patient Instructions: 1)  Please schedule a follow-up appointment in 1 month. 2)  Tobacco is very bad for your health and your loved ones! You Should stop smoking!. 3)  Stop Smoking Tips: Choose a Quit date. Cut down before the Quit date. decide what you will do as a substitute when you feel the urge to smoke(gum,toothpick,exercise). 4)   Avoid foods high in acid (tomatoes, citrus juices, spicy foods). Avoid eating within two hours of lying down or before exercising. Do not over eat; try smaller more frequent meals. Elevate head of bed twelve inches when sleeping. 5)  Cigarette smoking is estimated to be responsible for over five million premature deaths worldwide, making it the leading preventable cause of death. The most important causes of smoking-related mortality include atherosclerotic cardiovascular disease, lung cancer, and chronic obstructive pulmonary disease (COPD).  6)  If your pain gets worse and you experience fever and  vomitng, please go to emergency room or call 911 Process Orders Check Orders Results:     [facility]: Check successful Tests Sent for requisitioning (August 28, 2009 3:37 PM):     08/28/2009: [facility] -- T-Comprehensive Metabolic Panel [[id number]] (signed)     08/28/2009: [facility] -- T-CBC No Diff [[id number]] (signed)     08/28/2009: [facility] -- T-Lipase [id number] (signed)     08/28/2009: [facility] -- T-CK MB Fract [id number] (signed)     08/28/2009: [facility] -- T-Troponin I [[id number]] (signed)    Prevention & Chronic Care Immunizations   Influenza vaccine: Historical  (10/23/2008)   Influenza vaccine deferral: Not indicated  (06/20/2009)   Influenza vaccine due: 11/27/2006    Tetanus booster: 10/19/2006: Td   Td booster deferral: Not indicated  (03/22/2009)   Tetanus booster due: 10/18/2016    Pneumococcal vaccine: Not documented    H. zoster vaccine: Not documented   H. zoster vaccine deferral: Not indicated  (08/28/2009)  Colorectal Screening  Hemoccult: Not documented   Hemoccult action/deferral: Not indicated  (08/28/2009)    Colonoscopy: normal  (08/18/2005)   Colonoscopy action/deferral: Not indicated  (03/22/2009)   Colonoscopy due: 08/19/2015  Other  Screening   Pap smear: NEGATIVE FOR INTRAEPITHELIAL LESIONS OR MALIGNANCY.  (12/01/2008)   Pap smear action/deferral: GYN Referral  (11/15/2008)   Pap smear due: 12/2011    Mammogram: Assessment: BIRADS 1.   (08/03/2007)   Mammogram action/deferral: Not indicated  (03/22/2009)   Mammogram due: 08/02/2008    DXA bone density scan: Not documented   Smoking status: current  (08/28/2009)   Smoking cessation counseling: yes  (08/28/2009)  Diabetes Mellitus   HgbA1C: 6.0  (06/20/2009)   HgbA1C action/deferral: Ordered  (06/20/2009)   Hemoglobin A1C due: 08/26/2006    Eye exam: No diabetic retinopathy.     (09/09/2007)   Diabetic eye exam action/deferral: Refused  (06/20/2009)   Eye exam due: 09/2008    Foot exam: yes  (11/30/2008)   High risk foot: Not documented   Foot care education: Not documented    Urine microalbumin/creatinine ratio: 39.3  (11/15/2008)   Urine microalbumin action/deferral: Ordered   Urine microalbumin/cr due: 08/26/2006    Diabetes flowsheet reviewed?: Yes   Progress toward A1C goal: Deteriorated  Lipids   Total Cholesterol: 111  (03/22/2009)   Lipid panel action/deferral: Lipid Panel ordered   LDL: 45  (03/22/2009)   LDL Direct: Not documented   HDL: 34  (03/22/2009)   Triglycerides: 159  (03/22/2009)   Lipid panel due: 11/27/2006    SGOT (AST): 13  (03/22/2009)   SGPT (ALT): 15  (03/22/2009) CMP ordered    Alkaline phosphatase: 44  (03/22/2009)   Total bilirubin: 0.4  (03/22/2009)    Lipid flowsheet reviewed?: Yes   Progress toward LDL goal: At goal  Hypertension   Last Blood Pressure: 102 / 56  (08/28/2009)   Serum creatinine: 0.85  (03/22/2009)   Serum potassium 4.4  (03/22/2009) CMP ordered     Hypertension flowsheet reviewed?: Yes   Progress toward BP goal: At goal  Self-Management Support :   Personal Goals (by the next clinic visit) :     Personal A1C goal: 7  (03/22/2009)     Personal blood pressure goal: 130/80   (03/22/2009)     Personal LDL goal: 100  (03/22/2009)    Patient will work on the following items until the next clinic visit to reach self-care goals:     Medications and monitoring: take my medicines every day, bring all of my medications to every visit  (08/28/2009)     Eating: drink diet soda or water instead of juice or soda, eat more vegetables, use fresh or frozen vegetables, eat foods that are low in salt, eat baked foods instead of fried foods, eat fruit for snacks and desserts, limit or avoid alcohol  (08/28/2009)     Activity: take a 30 minute walk every day, take the stairs instead of the elevator  (06/20/2009)    Diabetes self-management support: Written self-care plan, Education handout, Resources for patients handout  (08/28/2009)   Diabetes care plan printed   Diabetes education handout printed   Last diabetes self-management training by diabetes educator: 06/20/2009    Hypertension self-management support: Written self-care plan, Education handout, Resources for patients handout  (08/28/2009)   Hypertension self-care plan printed.   Hypertension education handout printed    Lipid self-management support: Written self-care plan, Education handout, Resources for patients handout  (08/28/2009)   Lipid self-care  plan printed.   Lipid education handout printed      Resource handout printed.

## 2010-03-12 NOTE — Progress Notes (Signed)
Summary: diabetes support/dmr  Phone Note Call from Patient Call back at Home Phone [phone]   Caller: Patient Summary of Call: Patient called to report insulin/CBG  progress. fasting CBgs are as follows: 4/13- 209 4/12- 199 4/11- 193 4/10- 145 4/9- 143 4/8- 167 4/7-  157 4/6- 188  Discussed further self titration to 22 units Lantus to acheive goal fasting CBgs. Patient vberbalized understanding of 1 unit each day up to 22 units. Reviewed injection site rotation and holding syinge plunger for count of 6-10 seconds prior to withdrawing due to fluctuating blood sugars. patient also ackowledged increase anxiety in past few days from dentist appointment and broken tooth whcih may be affecting blood sugars.  Route this note to Dr. Pearline Cables- patient has appointment with her 4/28 Initial call taken by: Barnabas Harries RD,CDE,  May 23, 2008 9:20 AM  Follow-up for Phone Call        Please have patient titrate Lantus to goal fasting CBG.  Follow-up by: Felicity Pellegrini MD,  May 23, 2008 9:31 AM    New/Updated Medications: LANTUS 100 UNIT/ML SOLN (INSULIN GLARGINE) Inject 17 units subcutaneously daily

## 2010-03-12 NOTE — Progress Notes (Signed)
Summary: prior authorizaton-Omeprazole/gp  Phone Note Other Incoming   Caller: Prior Authorization Summary of Call: Prior Authorization for Omeprazole 79m capsule has been approved x 1 year. From 07/25/09 to 07/26/10. P79pharmacy made awared. Initial call taken by: GMorrison OldRN,  July 31, 2009 10:23 AM

## 2010-03-12 NOTE — Assessment & Plan Note (Signed)
Summary: checkup per dr klima/pcp-magick/hla   Vital Signs:  Patient profile:   60 year old female Height:      64 inches (162.56 cm) Weight:      171.8 pounds (78.09 kg) BMI:     29.60 Temp:     97.1 degrees F (36.17 degrees C) oral Pulse rate:   78 / minute BP sitting:   121 / 69  (right arm) Cuff size:   regular  Vitals Entered By: Nadine Counts [name]) (March 22, 2009 1:27 PM) CC: runny eyes, depression, sore throat, coughing (mostly productive), cpap mask leaks, discuss feet problems, med refill Is Patient Diabetic? Yes Did you bring your meter with you today? Yes Pain Assessment Patient in pain? yes      Onset of pain  Chronic Nutritional Status BMI of 25 - 29 = overweight CBG Result 132  Have you ever been in a relationship where you felt threatened, hurt or afraid?No   Does patient need assistance? Functional Status Self care Ambulation Normal   Primary Care Provider:  Trinidad Curet MD  CC:  runny eyes, depression, sore throat, coughing (mostly productive), cpap mask leaks, discuss feet problems, and med refill.  History of Present Illness: 60 yo female with HTN, HLD presents today with main concern of flu like symptoms, nasal congestion that has been getting better, myalgias. The symptoms started 7 days ago and she has been getting progressively better. She denies any fever, chills, chest pain, and while she still has cough it has significantly improved. She had some leftovers of mucinex and has ran out but reports that has been helping her a lot. She denies any systemic symptoms, maintains good appetite, no lumps or nodules noted anywhere on the body.     Depression History:      The patient is having a depressed mood most of the day and has a diminished interest in her usual daily activities.  The patient denies significant weight loss, significant weight gain, insomnia, hypersomnia, psychomotor agitation, psychomotor retardation, fatigue (loss of energy),  feelings of worthlessness (guilt), impaired concentration (indecisiveness), and recurrent thoughts of death or suicide.        The patient denies that she feels like life is not worth living, denies that she wishes that she were dead, and denies that she has thought about ending her life.         Preventive Screening-Counseling & Management  Alcohol-Tobacco     Alcohol drinks/day: 0     Smoking Status: current     Smoking Cessation Counseling: yes     Packs/Day: 1.5     Year Started: AT THE AGE OF 12  Problems Prior to Update: 1)  Hypertension  (ICD-401.9) 2)  Dyslipidemia  (ICD-272.4) 3)  Hypertriglyceridemia  (ICD-272.1) 4)  Microalbuminuria  (ICD-791.0) 5)  Dental Pain  (ICD-525.9) 6)  Shingles  (ICD-053.9) 7)  Anemia  (ICD-285.9) 8)  Gerd  (ICD-530.81) 9)  Bunion  (ICD-727.1) 10)  Aodm  (ICD-250.00) 11)  Hematochezia  (ICD-578.1) 12)  Arthropathy Nec, Other Spec Site  (ICD-716.88) 13)  Colonic Polyps  (ICD-211.3) 14)  Obstructive Sleep Apnea  (ICD-780.57) 15)  Pulmonary Hypertension  (ICD-416.8) 16)  Osteoporosis  (ICD-733.00) 17)  Depression  (ICD-311) 18)  COPD  (ICD-496) 19)  Fibromyalgia  (ICD-729.1) 20)  Irritable Bowel Syndrome  (ICD-564.1) 21)  Health Maintenance Exam  (ICD-V70.0)  Medications Prior to Update: 1)  Prilosec Otc 20 Mg  Tbec (Omeprazole Magnesium) .... Take Two Tablets By Mouth Daily  2)  Alprazolam 0.5 Mg  Tabs (Alprazolam) .... One Pill Daily As Needed For Anxiety 3)  Albuterol 90 Mcg/act Aers (Albuterol) .... Two Puffs Every Four Hours As Needed For Shortness of Breath 4)  Hydrochlorothiazide 25 Mg Tabs (Hydrochlorothiazide) .... Take 1 Tablet By Mouth Once A Day 5)  Spiriva Handihaler 18 Mcg Caps (Tiotropium Bromide Monohydrate) .... Inhale Contents of 1 Capsule Once A Day 6)  Crestor 20 Mg  Tabs (Rosuvastatin Calcium) .... Take 1 Tablet By Mouth Once A Day 7)  Advair Diskus 250-50 Mcg/dose Misc (Fluticasone-Salmeterol) .Marland Kitchen.. 1 Puff Twice  Daily 8)  Glucophage Xr 500 Mg Xr24h-Tab (Metformin Hcl) .... 2 Pills Twice A Day 9)  Cymbalta 30 Mg Cpep (Duloxetine Hcl) .... Take 3  Tables By Mouth Once Daily 10)  Aspir-Low 81 Mg Tbec (Aspirin) .... Take 1 Tablet By Mouth Once A Day 11)  Glipizide Xl 10 Mg  Tb24 (Glipizide) .... Take 1 Tablet By Mouth Twice A Day 12)  Hydrocodone-Ibuprofen 7.5-200 Mg Tabs (Hydrocodone-Ibuprofen) .... Take One Tab By Mouth Once Every 6 Hours As Needed For Pain 13)  Lantus 100 Unit/ml Soln (Insulin Glargine) .... Inject 30 Units Subcutaneously Daily and Increase Until Fasting Blood Sugars Are Mostly Less Than 130 Mg/dl 14)  Onetouch Ultra Test  Strp (Glucose Blood) .... Use To Test Blood Sugars Three Times A Day 15)  Insulin Syringe 31g X 5/16" 0.3 Ml Misc (Insulin Syringe-Needle U-100) .... Use To Inject Lantus Insulin Once Daily 16)  Lisinopril 5 Mg Tabs (Lisinopril) .... Take 1 Tablet By Mouth Once A Day  Current Medications (verified): 1)  Prilosec Otc 20 Mg  Tbec (Omeprazole Magnesium) .... Take Two Tablets By Mouth Daily 2)  Alprazolam 0.5 Mg  Tabs (Alprazolam) .... One Pill Daily As Needed For Anxiety 3)  Albuterol 90 Mcg/act Aers (Albuterol) .... Two Puffs Every Four Hours As Needed For Shortness of Breath 4)  Hydrochlorothiazide 25 Mg Tabs (Hydrochlorothiazide) .... Take 1 Tablet By Mouth Once A Day 5)  Spiriva Handihaler 18 Mcg Caps (Tiotropium Bromide Monohydrate) .... Inhale Contents of 1 Capsule Once A Day 6)  Crestor 20 Mg  Tabs (Rosuvastatin Calcium) .... Take 1 Tablet By Mouth Once A Day 7)  Advair Diskus 250-50 Mcg/dose Misc (Fluticasone-Salmeterol) .Marland Kitchen.. 1 Puff Twice Daily 8)  Glucophage Xr 500 Mg Xr24h-Tab (Metformin Hcl) .... 2 Pills Twice A Day 9)  Cymbalta 30 Mg Cpep (Duloxetine Hcl) .... Take 3  Tables By Mouth Once Daily 10)  Aspir-Low 81 Mg Tbec (Aspirin) .... Take 1 Tablet By Mouth Once A Day 11)  Glipizide Xl 10 Mg  Tb24 (Glipizide) .... Take 1 Tablet By Mouth Twice A Day 12)   Hydrocodone-Ibuprofen 7.5-200 Mg Tabs (Hydrocodone-Ibuprofen) .... Take One Tab By Mouth Once Every 6 Hours As Needed For Pain 13)  Lantus 100 Unit/ml Soln (Insulin Glargine) .... Inject 30 Units Subcutaneously Daily and Increase Until Fasting Blood Sugars Are Mostly Less Than 130 Mg/dl 14)  Onetouch Ultra Test  Strp (Glucose Blood) .... Use To Test Blood Sugars Three Times A Day 15)  Insulin Syringe 31g X 5/16" 0.3 Ml Misc (Insulin Syringe-Needle U-100) .... Use To Inject Lantus Insulin Once Daily 16)  Lisinopril 5 Mg Tabs (Lisinopril) .... Take 1 Tablet By Mouth Once A Day  Allergies: 1)  ! Ultram 2)  ! Morphine  Past History:  Past Medical History: Last updated: 03/02/2007 COPD Depression Diabetes mellitus, type I Osteoporosis Anxiety Low back pain Chronic diffuse pain involving the back,  arms, and legs  Family History: Last updated: 11/30/2008 No significant family medical history  Social History: Last updated: 12/07/2007 Lives alone.  No EtOH.  No illegal drugs.  Poor relationship with children.    Risk Factors: Alcohol Use: 0 (03/22/2009) Exercise: no (11/30/2008)  Risk Factors: Smoking Status: current (03/22/2009) Packs/Day: 1.5 (03/22/2009)  Family History: Reviewed history from 11/30/2008 and no changes required. No significant family medical history  Social History: Reviewed history from 12/07/2007 and no changes required. Lives alone.  No EtOH.  No illegal drugs.  Poor relationship with children.    Review of Systems  The patient denies fever, weight loss, weight gain, hoarseness, chest pain, syncope, dyspnea on exertion, peripheral edema, prolonged cough, headaches, hemoptysis, abdominal pain, melena, difficulty walking, unusual weight change, abnormal bleeding, and enlarged lymph nodes.    Physical Exam  General:  alert, well-developed, well-nourished, and well-hydrated.   Head:  atraumatic, no abnormalities observed, and no abnormalities palpated.     Eyes:  vision grossly intact, pupils equal, pupils round, and pupils reactive to light.   Ears:  R ear normal, L ear normal, and no external deformities.   Nose:  no external deformity, no external erythema, no nasal discharge, no mucosal pallor, no mucosal edema, no airflow obstruction, no nasal polyps, no active bleeding or clots, no sinus percussion tenderness, and no septum abnormalities.   Mouth:  pharynx pink and moist, no erythema, no exudates, no postnasal drip, and teeth missing.   Lungs:  normal respiratory effort, no intercostal retractions, no accessory muscle use, normal breath sounds, no crackles, and no wheezes.   Heart:  normal rate, regular rhythm, no murmur, no gallop, and no rub.     Impression & Recommendations:  Problem # 1:  HYPERTENSION (ICD-401.9) Great control, will continue the same regimen. Will also check electrolyte panel since she was recently started on Lisinopril.   Her updated medication list for this problem includes:    Hydrochlorothiazide 25 Mg Tabs (Hydrochlorothiazide) .Marland Kitchen... Take 1 tablet by mouth once a day    Lisinopril 5 Mg Tabs (Lisinopril) .Marland Kitchen... Take 1 tablet by mouth once a day  BP today: 121/69 Prior BP: 116/70 (11/30/2008)  Labs Reviewed: K+: 4.0 (06/15/2008) Creat: : 0.77 (06/15/2008)   Chol: 108 (11/15/2008)   HDL: 38 (11/15/2008)   LDL: 46 (11/15/2008)   TG: 122 (11/15/2008)  Orders: T-Comprehensive Metabolic Panel ([id number])  Problem # 2:  UPPER RESPIRATORY INFECTION, ACUTE (ICD-465.9)  Pt's symptoms and physical exam findings are consistent with URI, most likely viral in etiology. Sh ehas been taking mucinex in the past for ti and would like the perscription for it again. I think this is reasonable, will perscribe treatment for 10-14 days. Pt was advised to come back to see [place] if her symptoms do not resolve or get worse after 7 days or if she develops fever and other systemic symptoms.   Her updated medication list for this  problem includes:    Mucinex 600 Mg Xr12h-tab (Guaifenesin) .Marland Kitchen... Take 1 tablet twice daily for 10-14 days  Problem # 3:  DYSLIPIDEMIA (ICD-272.4) Will check  lipid panel today to ensure that cholesterol and LDL are trending down. Still continuing same regimen. Will also check ALT/AST to ensure no liver toxicities.   Her updated medication list for this problem includes:    Crestor 20 Mg Tabs (Rosuvastatin calcium) .Marland Kitchen... Take 1 tablet by mouth once a day  Orders: T-Comprehensive Metabolic Panel ([id number]) T-Lipid Profile [phone])  Labs Reviewed: SGOT: 23 (  04/18/2008)   SGPT: 26 (04/18/2008)   HDL:38 (11/15/2008), 32 (04/18/2008)  LDL:46 (11/15/2008), 51 (04/18/2008)  Chol:108 (11/15/2008), 127 (04/18/2008)  Trig:122 (11/15/2008), 222 (04/18/2008)  Problem # 4:  GERD (ICD-530.81) Well controlled on prilosec. Continue the same regimen. Her updated medication list for this problem includes:    Prilosec Otc 20 Mg Tbec (Omeprazole magnesium) .Marland Kitchen... Take two tablets by mouth daily  Problem # 5:  DEPRESSION (ICD-311) Well controlled on current regimen. Will continue the same.   Her updated medication list for this problem includes:    Alprazolam 0.5 Mg Tabs (Alprazolam) ..... One pill daily as needed for anxiety    Cymbalta 30 Mg Cpep (Duloxetine hcl) .Marland Kitchen... Take 3  tables by mouth once daily  Complete Medication List: 1)  Prilosec Otc 20 Mg Tbec (Omeprazole magnesium) .... Take two tablets by mouth daily 2)  Alprazolam 0.5 Mg Tabs (Alprazolam) .... One pill daily as needed for anxiety 3)  Albuterol 90 Mcg/act Aers (Albuterol) .... Two puffs every four hours as needed for shortness of breath 4)  Hydrochlorothiazide 25 Mg Tabs (Hydrochlorothiazide) .... Take 1 tablet by mouth once a day 5)  Spiriva Handihaler 18 Mcg Caps (Tiotropium bromide monohydrate) .... Inhale contents of 1 capsule once a day 6)  Crestor 20 Mg Tabs (Rosuvastatin calcium) .... Take 1 tablet by mouth once a  day 7)  Advair Diskus 250-50 Mcg/dose Misc (Fluticasone-salmeterol) .Marland Kitchen.. 1 puff twice daily 8)  Glucophage Xr 500 Mg Xr24h-tab (Metformin hcl) .... 2 pills twice a day 9)  Cymbalta 30 Mg Cpep (Duloxetine hcl) .... Take 3  tables by mouth once daily 10)  Aspir-low 81 Mg Tbec (Aspirin) .... Take 1 tablet by mouth once a day 11)  Glipizide Xl 10 Mg Tb24 (Glipizide) .... Take 1 tablet by mouth twice a day 12)  Hydrocodone-ibuprofen 7.5-200 Mg Tabs (Hydrocodone-ibuprofen) .... Take one tab by mouth once every 6 hours as needed for pain 13)  Lantus 100 Unit/ml Soln (Insulin glargine) .... Inject 30 units subcutaneously daily and increase until fasting blood sugars are mostly less than 130 mg/dl 14)  Onetouch Ultra Test Strp (Glucose blood) .... Use to test blood sugars three times a day 15)  Insulin Syringe 31g X 5/16" 0.3 Ml Misc (Insulin syringe-needle u-100) .... Use to inject lantus insulin once daily 16)  Lisinopril 5 Mg Tabs (Lisinopril) .... Take 1 tablet by mouth once a day 17)  Mucinex 600 Mg Xr12h-tab (Guaifenesin) .... Take 1 tablet twice daily for 10-14 days  Other Orders: T-Hgb A1C (in-house) ([id number]) T- Capillary Blood Glucose ([id number])  Patient Instructions: 1)  Please schedule a follow-up appointment in 3 months. 2)  Stop Smoking Tips: Choose a Quit date. Cut down before the Quit date. decide what you will do as a substitute when you feel the urge to smoke(gum,toothpick,exercise). 3)  Check your Blood Pressure regularly. If it is above 170: you should make an appointment. Prescriptions: MUCINEX 600 MG XR12H-TAB (GUAIFENESIN) take 1 tablet twice daily for 10-14 days  #28 x 0   Entered and Authorized by:   Trinidad Curet MD   Signed by:   Trinidad Curet MD on 03/22/2009   Method used:   Print then Give to Patient   RxID:   [id number]  Process Orders Check Orders Results:     [facility]: Check successful Tests Sent for requisitioning (March 22, 2009 2:33  PM):     03/22/2009: [facility] -- T-Comprehensive Metabolic Panel [[id number]] (signed)  03/22/2009: [facility] -- T-Lipid Profile [phone] (signed)    Prevention & Chronic Care Immunizations   Influenza vaccine: Historical  (10/23/2008)   Influenza vaccine deferral: Deferred  (03/22/2009)   Influenza vaccine due: 11/27/2006    Tetanus booster: 10/19/2006: Td   Td booster deferral: Not indicated  (03/22/2009)   Tetanus booster due: 10/18/2016    Pneumococcal vaccine: Not documented  Colorectal Screening   Hemoccult: Not documented   Hemoccult action/deferral: Deferred  (11/15/2008)    Colonoscopy: normal  (08/18/2005)   Colonoscopy action/deferral: Not indicated  (03/22/2009)   Colonoscopy due: 08/19/2015  Other Screening   Pap smear: NEGATIVE FOR INTRAEPITHELIAL LESIONS OR MALIGNANCY.  (12/01/2008)   Pap smear action/deferral: GYN Referral  (11/15/2008)   Pap smear due: 12/2011    Mammogram: Assessment: BIRADS 1.   (08/03/2007)   Mammogram action/deferral: Not indicated  (03/22/2009)   Mammogram due: 08/02/2008   Smoking status: current  (03/22/2009)   Smoking cessation counseling: yes  (03/22/2009)  Diabetes Mellitus   HgbA1C: 6.1  (03/22/2009)   HgbA1C action/deferral: Ordered  (11/15/2008)   Hemoglobin A1C due: 08/26/2006    Eye exam: No diabetic retinopathy.     (09/09/2007)   Diabetic eye exam action/deferral: Deferred  (03/22/2009)   Eye exam due: 09/2008    Foot exam: yes  (11/30/2008)   High risk foot: Not documented   Foot care education: Not documented    Urine microalbumin/creatinine ratio: 39.3  (11/15/2008)   Urine microalbumin action/deferral: Ordered   Urine microalbumin/cr due: 08/26/2006    Diabetes flowsheet reviewed?: Yes   Progress toward A1C goal: At goal  Lipids   Total Cholesterol: 108  (11/15/2008)   Lipid panel action/deferral: Lipid Panel ordered   LDL: 46  (11/15/2008)   LDL  Direct: Not documented   HDL: 38  (11/15/2008)   Triglycerides: 122  (11/15/2008)   Lipid panel due: 11/27/2006    SGOT (AST): 23  (04/18/2008)   SGPT (ALT): 26  (04/18/2008) CMP ordered    Alkaline phosphatase: 43  (04/18/2008)   Total bilirubin: 0.3  (04/18/2008)    Lipid flowsheet reviewed?: Yes   Progress toward LDL goal: At goal  Hypertension   Last Blood Pressure: 121 / 69  (03/22/2009)   Serum creatinine: 0.77  (06/15/2008)   Serum potassium 4.0  (06/15/2008) CMP ordered     Hypertension flowsheet reviewed?: Yes   Progress toward BP goal: At goal  Self-Management Support :   Personal Goals (by the next clinic visit) :     Personal A1C goal: 7  (03/22/2009)     Personal blood pressure goal: 130/80  (03/22/2009)     Personal LDL goal: 100  (03/22/2009)    Patient will work on the following items until the next clinic visit to reach self-care goals:     Medications and monitoring: take my medicines every day, check my blood sugar, examine my feet every day  (03/22/2009)     Eating: drink diet soda or water instead of juice or soda, eat foods that are low in salt, eat baked foods instead of fried foods  (03/22/2009)     Activity: take a 30 minute walk every day  (03/22/2009)    Diabetes self-management support: Copy of home glucose meter record  (03/22/2009)   Last diabetes self-management training by diabetes educator: 05/01/2008    Hypertension self-management support: Not documented    Lipid self-management support: Not documented    Laboratory Results   Blood Tests  Date/Time Received: March 22, 2009 1:50 PM  Date/Time Reported: Lenoria Farrier  March 22, 2009 1:50 PM   HGBA1C: 6.1%   (Normal Range: Non-Diabetic - 3-6%   Control Diabetic - 6-8%) CBG Random:: 112m/dL

## 2010-03-12 NOTE — Progress Notes (Signed)
Summary: Refill/gh  Phone Note Refill Request Message from:  Fax from Pharmacy on August 15, 2008 11:33 AM  Refills Requested: Medication #1:  ADVAIR DISKUS 250-50 MCG/DOSE MISC 1 puff twice daily   Last Refilled: 06/02/2008  Method Requested: Electronic Initial call taken by: Sander Nephew RN,  August 15, 2008 11:33 AM  Follow-up for Phone Call        Refilled electronically.  Follow-up by: Bertha Stakes MD,  August 15, 2008 11:55 AM      Prescriptions: ADVAIR DISKUS 250-50 MCG/DOSE MISC (FLUTICASONE-SALMETEROL) 1 puff twice daily  #1 diskus x 5   Entered and Authorized by:   Bertha Stakes MD   Signed by:   Bertha Stakes MD on 08/15/2008   Method used:   Electronically to        [facility] [phone]* (retail)       [address]       [place], [place]  [address]       Ph: [phone]       Fax: [phone]   RxID:   [id number]

## 2010-03-12 NOTE — Progress Notes (Signed)
Summary: refill/gg  Phone Note Refill Request  on March 27, 2009 11:56 AM  Refills Requested: Medication #1:  HYDROCODONE-IBUPROFEN 7.5-200 MG TABS Take one tab by mouth once every 6 hours as needed for pain  Method Requested: Telephone to Pharmacy Initial call taken by: Gevena Cotton RN,  March 27, 2009 11:56 AM  Follow-up for Phone Call        completed Follow-up by: Trinidad Curet MD,  March 28, 2009 10:07 AM    Prescriptions: HYDROCODONE-IBUPROFEN 7.5-200 MG TABS (HYDROCODONE-IBUPROFEN) Take one tab by mouth once every 6 hours as needed for pain  #120 x 0   Entered and Authorized by:   Trinidad Curet MD   Signed by:   Trinidad Curet MD on 03/28/2009   Method used:   Telephoned to ...       [facility] (mail-order)       [address] [place],   [address]       Ph: [phone]       Fax: [phone]   RxID:   [id number]   Appended Document: refill/gg Rx called to pharmacy

## 2010-03-12 NOTE — Letter (Signed)
Summary: Social Security Disability  Social Security Disability   Imported By: Bonner Puna 05/05/2007 14:34:27  _____________________________________________________________________  External Attachment:    Type:   Image     Comment:   External Document

## 2010-03-12 NOTE — Progress Notes (Signed)
Summary: diabetes support/dmr  Phone Note Call from Patient Call back at Home Phone [phone]   Caller: Patient Summary of Call: 500 mg ER metformin two pills twice a day for years, recently refilled and it is different. Doesn't understand why iit was changed after years of tolerating the ER form?  thinks regular metformin tore her stomach up. Already picked up without realizing it was different and willing to try and would like to know reason for change. Initial call taken by: Barnabas Harries RD,CDE,  Jul 06, 2008 3:24 PM  Follow-up for Phone Call        Patient was taking 546m Metformin XL two pills twice a day which is nonsensical.  Changed to 10038mof regular release glucophage twice a day.  OK to switch back if pt feels she tolerated it better Follow-up by: KaFelicity PellegriniD,  Jul 06, 2008 5:16 PM  Additional Follow-up for Phone Call Additional follow up Details #1::        called patient and relayed the above. she'll let usKoreanow if she wants to switch back. Additional Follow-up by: DoBarnabas HarriesD,CDE,  Jul 07, 2008 10:48 AM

## 2010-03-12 NOTE — Progress Notes (Signed)
Summary: diabetes support/dmr  Phone Note Call from Patient Call back at Home Phone [phone]   Caller: Patient Summary of Call: called to cancel appointment for next week. asked her to relate to me what she is doing so I can relay this to her physician:taking only one glipizide now, insulin 30 units lantus, novolog varies- depends on what she eats- using 1:10 grams has not taken over 5 units, seems to be working- only one over 247m/dl and no lows recently.    wants to know results of urinalysis will forward this note to Dr. GPearline Cables Initial call taken by: DBarnabas HarriesRD,CDE,  Jun 23, 2008 11:05 AM  Follow-up for Phone Call        THe last time she had a urine analysis was in December.  At her last visit, she had a BMET which had a Cr that was normal.   Follow-up by: KFelicity PellegriniMD,  Jun 23, 2008 11:21 AM  Additional Follow-up for Phone Call Additional follow up Details #1::        Called patient and related information- she said neither of these was what she was waiting for results. it was a 24 hour urine. Note a 24 hour urine in the orders. Will put this on triage nurse desk and ask them to follow-up. Additional Follow-up by: DBarnabas HarriesRD,CDE,  Jun 23, 2008 3:36 PM    Additional Follow-up for Phone Call Additional follow up Details #2::    i spoke w/ pt, 24hr urine possibly will be back tuesday Follow-up by: HFreddy FinnerRN,  Jun 23, 2008 5:03 PM

## 2010-03-12 NOTE — Progress Notes (Signed)
Summary: gi, cbg's, change pcp/ hla  Phone Note Call from Patient   Summary of Call: pt calls to say she is continuing to have gi issues and elevated blood sugars, >300, at times. she states she needs follow up before december, appt is made for 10/14 w/ dr Rhys Martini, she is then transferred to [place] for diab assist. pt is requesting dr Rhys Martini as her pcp and this  request is [facility] to [facility] verbally. pt is reminded that she may go to ED if above problems become more concerning or she feels she needs immediate evaluation. she verb understanding and thanks me. i transferred call to [place] Initial call taken by: Freddy Finner RN,  November 14, 2009 12:05 PM  Follow-up for Phone Call        received message to call patient: returned call; She is calling in to report blood sugars as below- has an appointment with Dr. Felix Pacini -Doy Mince on 10-14; assured her that her bllod sugars reported below are not an emergency, but to continue doing what she is doing and that we'd call her back if she needed to do anything diffferent. Patient reports all the readings below are before meals except when noted 11/09/09- 168   207    196  10/1-8am- 151,       197 (later before breakfast), 225 3pm,  10/2- 9am 151 fasting, 2pm -186 7PM- 246 10/3- 10 am- 174, 3pm 199, 10 pm - 358, 317 on neighbor's meter 10/4- 10 am 175 fasting, 6pm- 338 before dinner-  had a small (1/2 cup) bowl of white beans for lunch before 338 reading 10/5- 177 at 6am, ate small bowl of cereal with prilosec, 211 at 10:30AM, severe heartburn today. ate lunch 1 PM- after lunch 222.   Follow-up by: Barnabas Harries RD,CDE,  November 14, 2009 2:07 PM    Additional Follow-up for Phone Call Additional follow up Details #2::    Please ask Ms. Rackham to make the appt with GI as we discussed during our visit. She was supposed to f/u with GI after her hospital discharge in July, but did not do so. We will review her GI concerns during her visit on  10/14,  but ultimately she will need to see GI as well.  Thank you.  [name], D.O.  Follow-up by: [name], D.O.

## 2010-03-12 NOTE — Progress Notes (Signed)
Summary: Med refill/dms  Phone Note Refill Request Message from:  Fax from Pharmacy on March 05, 2006 2:25 PM  Refills Requested: Medication #1:  CHANTIX STARTING MONTH PAK 0.5 MG X 11 & 1 MG X 42 MISC one daily x 1 wk Labs up to date 11/26/05. Last office visit with Dr. Derrel Nip on 02/24/06. ***As of last office visit, pt was still smoking. This RX was written on 09/23/05*** Do you wish to continue this?   Method Requested: Fax to [facility] Initial call taken by: Pollyann Samples,  March 05, 2006 2:25 PM  Follow-up for Phone Call        Refill approved-nurse to complete Discussed with Dr. Derrel Nip (PCP).  She approves refill Follow-up by: Milta Deiters MD,  March 05, 2006 3:32 PM  Additional Follow-up for Phone Call Additional follow up Details #1::        Rx faxed to pharmacy Additional Follow-up by: Pollyann Samples,  March 05, 2006 3:40 PM  New/Updated Medications: CHANTIX 1 MG TABS (VARENICLINE TARTRATE) Take 1 tablet by mouth two times a day for 3 months  New/Updated Medications: CHANTIX 1 MG TABS (VARENICLINE TARTRATE) Take 1 tablet by mouth two times a day for 3 months  Prescriptions: CHANTIX 1 MG TABS (VARENICLINE TARTRATE) Take 1 tablet by mouth two times a day for 3 months  #62 x 2   Entered and Authorized by:   Milta Deiters MD   Signed by:   Milta Deiters MD on 03/05/2006   Method used:   Telephoned to ...         RxID:   [id number] CHANTIX STARTING MONTH PAK 0.5 MG X 11 & 1 MG X 42 MISC (VARENICLINE TARTRATE) one daily x 1 wk, then twice daily  #0 x 0   Entered and Authorized by:   Milta Deiters MD   Signed by:   Milta Deiters MD on 03/05/2006   Method used:   Telephoned to ...         RxID:   [id number]

## 2010-03-12 NOTE — Medication Information (Signed)
Summary: PHYSICIAN PHARMACY   PHYSICIAN PHARMACY   Imported By: Garlan Fillers 05/15/2009 10:32:35  _____________________________________________________________________  External Attachment:    Type:   Image     Comment:   External Document

## 2010-03-12 NOTE — Progress Notes (Signed)
Summary: SW  Phone Note Other Incoming   Call placed by: patient Call placed to: Social Work Summary of Call: [name] called to let us know that she was seen by Urgent Care on Thursday for her tooth.  She is on an antibiotic.  She wanted me to let [facility] in Triage know so that a dental referral could be facilitated.

## 2010-03-12 NOTE — Progress Notes (Signed)
Summary: refill/ hla  Phone Note Refill Request Message from:  Patient on August 13, 2006 4:55 PM  Refills Requested: Medication #1:  PRILOSEC 40 MG CPDR Take 1 capsule by mouth once a day Initial call taken by: Freddy Finner RN,  August 13, 2006 4:55 PM  Follow-up for Phone Call        Refill approved-nurse to complete Follow-up by: Lucy Chris MD,  August 13, 2006 5:26 PM  Additional Follow-up for Phone Call Additional follow up Details #1::        Rx faxed to pharmacy Additional Follow-up by: Freddy Finner RN,  August 13, 2006 6:14 PM    Prescriptions: PRILOSEC 40 MG CPDR (OMEPRAZOLE) Take 1 capsule by mouth once a day  #30 x 5   Entered and Authorized by:   Lucy Chris MD   Signed by:   Lucy Chris MD on 08/13/2006   Method used:   Telephoned to ...       [facility]       [place], [place]  [address]       Ph:        Fax:    RxID:   [id number]   Appended Document: refill/ hla Received another faxed request for this medication. The authorization information from this visit was faxed back to the pharmacy.

## 2010-03-12 NOTE — Assessment & Plan Note (Signed)
Summary: DIABETES TRAINING AND LAB/DS  CC:  start insulin Is Patient Diabetic? Yes  CBG Device ID One Touch Ultra 2   Allergies: 1)  ! Ultram 2)  ! Morphine   Complete Medication List: 1)  Prilosec Otc 20 Mg Tbec (Omeprazole magnesium) .... Take two tablets by mouth daily 2)  Alprazolam 0.5 Mg Tabs (Alprazolam) .... One pill daily as needed for anxiety 3)  Albuterol 90 Mcg/act Aers (Albuterol) .... Two puffs every four hours as needed for shortness of breath 4)  Hydrochlorothiazide 25 Mg Tabs (Hydrochlorothiazide) .... Take 1 tablet by mouth once a day 5)  Spiriva Handihaler 18 Mcg Caps (Tiotropium bromide monohydrate) .... Inhale contents of 1 capsule once a day 6)  Crestor 20 Mg Tabs (Rosuvastatin calcium) .... Take 1 tablet by mouth once a day 7)  Advair Diskus 250-50 Mcg/dose Misc (Fluticasone-salmeterol) .Marland Kitchen.. 1 puff twice daily 8)  Glucophage Xr 500 Mg Tb24 (Metformin hcl) .... 2 pills in am, 2 pills in pm 9)  Cymbalta 30 Mg Cpep (Duloxetine hcl) .... Take 3  tables by mouth once daily 10)  Aspir-low 81 Mg Tbec (Aspirin) 11)  Glipizide Xl 10 Mg Tb24 (Glipizide) .... Take 2 tablet by mouth once a day 12)  Hydrocodone-ibuprofen 7.5-200 Mg Tabs (Hydrocodone-ibuprofen) .... Take one tab by mouth once every 6 hours as needed for pain 13)  Loratadine 10 Mg Tabs (Loratadine) .... Take 1 tablet by mouth once a day 14)  Ferrous Sulfate 325 (65 Fe) Mg Tbec (Ferrous sulfate) .... Take 1 tablet by mouth three times a day 15)  Lantus 100 Unit/ml Soln (Insulin glargine) .... Inject 10 units subcutaneously daily 16)  Sidekick Blood Glucose System Devi (Blood gluc meter disp-strips) .... Use to test blood sugars three times a day  Other Orders: DSMT(Medicare) Individual, 30 Minutes (G0108)   Diabetes Self Management Training  PCP:  Felicity Pellegrini MD Date diagnosed with diabetes: 02/11/2003 Diabetes Type: Type 2 non-insulin treated Other persons present: no Current smoking Status:  current  Diabetes Medications:  Comments: taught how to inject insulin using syringe and vial. Patient sel finjected with saline then with lantus- she is very afraid fo low blood sugar- prefers to take lantus in the morning. Educated about insulin pens- she will look into the cost of these. Encouraged her to discuss self titration with her doctor. meter average  ~ 200 mg/dl x 14 days. advised patient to continue on pills for diabetes unless told otherwise by her doctor.  Long Acting  Insulin Type:Lantus Breakfast Dose: 10 units    Monitoring Self monitoring blood glucose 1 time a day Name of Meter  One Touch Ultra 2      Time of Testing  Before Breakfast  Recent Episodes of: Hyperglycemia : Yes      Medications State name-action-dose-duration-side effects-and time to take medication: Demonstrates competency State appropriate timing of food related to medication: Demonstrates competency Demonstrates/verbalizes site selection and rotation for injections Demonstrates competency Correctly draw up and administer insulin-[place]-Symlin-glucagon: Demonstrates competency State insulin adjustment guidelines: Not applicable  Describe safe needle/lancet disposal: Engineer, technical sales purpose and frequency of monitoring BG-ketones-HgbA1C and when to contact health care team with results: Needs review/assistance Perform glucose monitoring/ketone testing and record results correctly: Demonstrates competency State target blood glucose and HgbA1C goals: Needs review/assistance  Complications State the causes- signs and symptoms and prevention of hypoglycemia: Demonstrates competency Explain proper treatment of hypoglycemia: Demonstrates competency  Psychosocial Adjustment Identify how stress affects diabetes & two sources  of stress: Demonstrates competency Name two ways of obtaining support from family/friends: Needs review/assistance Diabetes Management  Education Done: 05/01/2008    BEHAVIORAL GOALS INITIAL Utilizing medications if for therapeutic effectiveness: Inject lantus insulin once daily Monitoring blood glucose levels daily: contiue to check blood sugar very morning and at other times during the days on occassion        Discussed Medicare coverage of insulin and supplies.  Diabetes Self Management Support: daughter, friend with daibetes and clinic staff Follow-up:as needed and by phone x 1-  2weeks  Last LDL:                                                 51 (04/18/2008 8:17:00 PM)

## 2010-03-12 NOTE — Assessment & Plan Note (Signed)
Summary: F/U DM, Anemia, Fibromyalgia (PCP Magick)   Vital Signs:  Patient profile:   60 year old female Height:      64 inches (162.56 cm) Weight:      170.06 pounds (77.30 kg) BMI:     29.30 Temp:     96.9 degrees F (36.06 degrees C) oral BP sitting:   120 / 72  (right arm) Cuff size:   regular  Vitals Entered By: Sander Nephew RN (November 08, 2009 2:51 PM) CC: F/U Diabetes, Fibromyalgia, Anemia Is Patient Diabetic? No Pain Assessment Patient in pain? yes     Location: legs, feet Intensity: 10 Type: aching Onset of pain  Constant Nutritional Status BMI of 25 - 29 = overweight  Have you ever been in a relationship where you felt threatened, hurt or afraid?No   Does patient need assistance? Functional Status Self care Ambulation Normal Comments Current pain medicine is not working.  Wants to change.  Problems with Bowel movements.  Constipated.  Eating Flax seed not working.  Flushing.  Sweats at times .  Needs referal to Cardiologist  and Mammogram.   Primary Care Provider:  Trinidad Curet MD  CC:  F/U Diabetes, Fibromyalgia, and Anemia.  History of Present Illness: Pt is a 60 yo F with PMHx of DMII, fibromyalgia, COPD, who presents to clinic today with multiple medical concerns, including the following:  1) Fibromyalgia - diagnosed over 20 years ago, feels pain throughout body, and was previously on Vicodin for treatment, did not get sufficient relief for the pain, was changed to Percocet, but she stopped it because of headaches. 8/10 pain currently, achy pain that flares up 4 times a week. No aggravating or alleviating factors.  2) DMII - Patient checking blood sugars 1 time daily, on awakening. Reports blood sugars of 130-170s mg/dL. Currently taking Lantus 40units. No hypoglycemic episodes since last visit.  During recent hospitalization was discontinued off of Glipizide and Metformin. + Polyuria, polydipsia, no blurry vision, no chest pain, no palpitations. Last HgA1c  in 08/11 was 5.6.  3) Anemia - hx of iron-deficiency anemia, etiology still unknown. Pt was hospitalized in 08/2009 secondary to symptomatic anemia, requiring blood transfusion. At that time, etiology was thought to be secondary to GI source, although FOBT was negative, given hx of colonic polyps and persistent epigastric pain. Pt was seen by GI inpt who recommended out-pt follow-up with EGD and regularly schedule colonoscopy in 2013. However, pt never followed-up with GI despite recommendations.  Now, pt denies weakness, shortness of breath, difficulty breathing, lightheadedness, dizziness.   Greater than 60 minutes spent with pt with > 50% of time spent counseling on multiple medical problems. Patient had several concerns today, and because of time limitations, was asked to address her most concerning problems first, with close follow-up, at which time we will address chronic sweating, constipation, etc.    Depression History:      The patient is having a depressed mood most of the day and has a diminished interest in her usual daily activities.        The patient denies that she feels like life is not worth living, denies that she wishes that she were dead, and denies that she has thought about ending her life.         Allergies: 1)  ! Ultram 2)  ! Morphine  Review of Systems       per HPI  Physical Exam  General:   Vital signs reviewed and noted. Well-developed,well-nourished,in no  acute distress; alert,appropriate and cooperative throughout examination. Head: normocephalic, atraumatic. Lungs: Normal respiratory effort. Clear to auscultation BL without crackles or wheezes.  Heart: RRR. S1 and S2 normal without gallop, murmur, or rubs.  Abdomen: BS normoactive. Soft, Nondistended, non-tender.  No masses or organomegaly. Extremities: No pretibial edema. No joint tenderness, swelling, warmth, or erythema.     Impression & Recommendations:  Problem # 1:  AODM  (ICD-250.00) Assessment Unchanged Last HgA1c was 5.6 in 08/11, but BG logs indicate morning hyperglycemia to occasional 200s mg/dL. There is concern that basal insulin dosage may be too much, given that this discrepancy. However, the 5.6 HgA1c in 08/11 reflects average BG over preceeding months during which she was previously taking Metformin, Glipizide, and Lantus.  - Will decrease Lantus to 30 units per night - Will request more frequent blood glucose monitoring. - Check microalb/creatinine ratio today - At next visit, will follow-up, assess pattern of blood sugars throughout day, adjust Lantus, and consider short or rapid acting insulin as indicated (pt prefers insulin to oral agents)  Her updated medication list for this problem includes:    Aspir-low 81 Mg Tbec (Aspirin) .Marland Kitchen... Take 1 tablet by mouth once a day    Lantus 100 Unit/ml Soln (Insulin glargine) ..... Inject 30 units subcutaneously daily    Prinzide 10-12.5 Mg Tabs (Lisinopril-hydrochlorothiazide) .Marland Kitchen... Take 1 tablet by mouth once a day  Orders: Ophthalmology Referral (Ophthalmology) T-Hgb A1C (in-house) [phone]) Gastroenterology Referral (GI) T-Urine Microalbumin w/creat. ratio [phone])  Problem # 2:  FIBROMYALGIA (ICD-729.1) Assessment: Deteriorated Percocet causing headaches, previously did better on Vicodin (but dose felt inadequate) - Pain contract established today - Will reassess next visit  The following medications were removed from the medication list:    Percocet 5-325 Mg Tabs (Oxycodone-acetaminophen) .Marland Kitchen... Take 1 tablet every 4-6 hours as needed for pain Her updated medication list for this problem includes:    Aspir-low 81 Mg Tbec (Aspirin) .Marland Kitchen... Take 1 tablet by mouth once a day    Norco 10-325 Mg Tabs (Hydrocodone-acetaminophen) .Marland Kitchen... Take 1 tablet by mouth four times a day as needed pain  Problem # 3:  ANEMIA (ICD-285.9) Assessment: Unchanged Asymptomatic currently. Source of anemia not yet  identified - thought to be possibly 2/2 to GI source.  - GI referral made - Reemphasized importance of GI follow-up, pt states she understands. - Continue iron  Her updated medication list for this problem includes:    Ferrous Sulfate 325 (65 Fe) Mg Tabs (Ferrous sulfate) .Marland Kitchen... Take 1 tablet by mouth two times a day for low blood iron  Problem # 4:  Preventive Health Care (ICD-V70.0) Secondary to the signifcant time required to address Ms. Modica's concerns, we were unable to address preventative care management during this visit. - Will address next visit  Complete Medication List: 1)  Prilosec 40 Mg Cpdr (Omeprazole) .... Take 1 tablet by mouth once a day 2)  Alprazolam 0.5 Mg Tabs (Alprazolam) .... One pill daily as needed for anxiety 3)  Albuterol 90 Mcg/act Aers (Albuterol) .... Two puffs every four hours as needed for shortness of breath 4)  Spiriva Handihaler 18 Mcg Caps (Tiotropium bromide monohydrate) .... Inhale contents of 1 capsule once a day 5)  Crestor 20 Mg Tabs (Rosuvastatin calcium) .... Take 1 tablet by mouth once a day 6)  Advair Diskus 250-50 Mcg/dose Misc (Fluticasone-salmeterol) .Marland Kitchen.. 1 puff twice daily 7)  Cymbalta 30 Mg Cpep (Duloxetine hcl) .... Take 3  tables by mouth once daily 8)  Aspir-low  81 Mg Tbec (Aspirin) .... Take 1 tablet by mouth once a day 9)  Lantus 100 Unit/ml Soln (Insulin glargine) .... Inject 30 units subcutaneously daily 10)  Onetouch Ultra Test Strp (Glucose blood) .... Use to test blood sugars three times a day 11)  Insulin Syringe 31g X 5/16" 0.3 Ml Misc (Insulin syringe-needle u-100) .... Use to inject lantus insulin once daily 12)  Loratadine 10 Mg Tabs (Loratadine) .... Take 1 tablet by mouth once a day 13)  Wellbutrin Xl 150 Mg Xr24h-tab (Bupropion hcl) .... Take 1 tablet once daily 14)  Ferrous Sulfate 325 (65 Fe) Mg Tabs (Ferrous sulfate) .... Take 1 tablet by mouth two times a day for low blood iron 15)  Colace 100 Mg Caps (Docusate  sodium) .... Take 1 tablet by mouth two times a day 16)  Prinzide 10-12.5 Mg Tabs (Lisinopril-hydrochlorothiazide) .... Take 1 tablet by mouth once a day 17)  Th Flax Seed Oil 1000 Mg Caps (Flaxseed (linseed)) .... 2 tablespoons daily 18)  Norco 10-325 Mg Tabs (Hydrocodone-acetaminophen) .... Take 1 tablet by mouth four times a day as needed pain  Patient Instructions: 1)  Please follow-up with me in 3-4 weeks, at which time we will reevaluate your diabetes, pain, and flushing. 2)  Please decrease your Lantus to 30units daily, check blood sugars 4 times daily, bring glucometer to next visit. 3)  You have been started on Norco for pain, avoid driving and operating heavy machinery while on this medication as it can cause sleepiness. if you develop rash, difficulty breathing, please stop the medication and call the clinic at [phone]. 4)  If you have worsening of your symptoms, develop chest pain, difficulty breathing, shortness of breath, weakness, please call the clinic. 5)  Please bring all of your medications in a bag to your next visit. Prescriptions: NORCO 10-325 MG TABS (HYDROCODONE-ACETAMINOPHEN) Take 1 tablet by mouth four times a day as needed pain  #120 x 0   Entered and Authorized by:   [name] DO   Signed by:   [name] DO on 11/08/2009   Method used:   Print then Give to Patient   RxID:   [id number]   Prevention & Chronic Care Immunizations   Influenza vaccine: Historical  (10/23/2008)   Influenza vaccine deferral: Not indicated  (06/20/2009)   Influenza vaccine due: 11/27/2006    Tetanus booster: 10/19/2006: Td   Td booster deferral: Not indicated  (11/08/2009)   Tetanus booster due: 10/18/2016    Pneumococcal vaccine: Not documented    H. zoster vaccine: Not documented   H. zoster vaccine deferral: Not indicated  (08/28/2009)  Colorectal Screening   Hemoccult: Not documented   Hemoccult action/deferral: Not indicated  (08/28/2009)     Colonoscopy: normal  (08/18/2005)   Colonoscopy action/deferral: Not indicated  (03/22/2009)   Colonoscopy due: 08/19/2015  Other Screening   Pap smear: NEGATIVE FOR INTRAEPITHELIAL LESIONS OR MALIGNANCY.  (12/01/2008)   Pap smear action/deferral: GYN Referral  (11/15/2008)   Pap smear due: 12/2011    Mammogram: Assessment: BIRADS 1.   (08/03/2007)   Mammogram action/deferral: Not indicated  (03/22/2009)   Mammogram due: 08/02/2008    DXA bone density scan: Not documented   Smoking status: current  (10/02/2009)   Smoking cessation counseling: yes  (10/02/2009)  Diabetes Mellitus   HgbA1C: 7.3  (11/08/2009)   HgbA1C action/deferral: Ordered  (06/20/2009)   Hemoglobin A1C due: 08/26/2006    Eye exam: No diabetic retinopathy.     (09/09/2007)  Diabetic eye exam action/deferral: Ophthalmology referral  (11/08/2009)   Eye exam due: 09/2008    Foot exam: yes  (09/14/2009)   High risk foot: Not documented   Foot care education: Not documented    Urine microalbumin/creatinine ratio: 39.3  (11/15/2008)   Urine microalbumin action/deferral: Ordered   Urine microalbumin/cr due: 08/26/2006  Lipids   Total Cholesterol: 111  (03/22/2009)   Lipid panel action/deferral: Lipid Panel ordered   LDL: 45  (03/22/2009)   LDL Direct: Not documented   HDL: 34  (03/22/2009)   Triglycerides: 159  (03/22/2009)   Lipid panel due: 11/27/2006    SGOT (AST): 14  (08/28/2009)   SGPT (ALT): 12  (08/28/2009)   Alkaline phosphatase: 38  (08/28/2009)   Total bilirubin: 0.4  (08/28/2009)    Lipid flowsheet reviewed?: Yes   Progress toward LDL goal: At goal  Hypertension   Last Blood Pressure: 120 / 72  (11/08/2009)   Serum creatinine: 0.82  (10/02/2009)   Serum potassium 4.5  (10/02/2009)    Hypertension flowsheet reviewed?: Yes   Progress toward BP goal: At goal  Self-Management Support :   Personal Goals (by the next clinic visit) :     Personal A1C goal: 7  (03/22/2009)     Personal  blood pressure goal: 130/80  (03/22/2009)     Personal LDL goal: 100  (03/22/2009)    Patient will work on the following items until the next clinic visit to reach self-care goals:     Medications and monitoring: take my medicines every day, bring all of my medications to every visit  (11/08/2009)     Eating: drink diet soda or water instead of juice or soda, eat more vegetables, use fresh or frozen vegetables, eat foods that are low in salt, eat baked foods instead of fried foods, eat fruit for snacks and desserts, limit or avoid alcohol  (11/08/2009)     Activity: take a 30 minute walk every day  (11/08/2009)    Diabetes self-management support: Written self-care plan, Education handout, Pre-printed educational material, Resources for patients handout  (11/08/2009)   Diabetes care plan printed   Diabetes education handout printed   Last diabetes self-management training by diabetes educator: 06/20/2009    Hypertension self-management support: Written self-care plan, Education handout, Pre-printed educational material, Resources for patients handout  (11/08/2009)   Hypertension self-care plan printed.   Hypertension education handout printed    Lipid self-management support: Written self-care plan, Education handout, Pre-printed educational material, Resources for patients handout  (11/08/2009)   Lipid self-care plan printed.   Lipid education handout printed      Resource handout printed.   Nursing Instructions: Refer for screening diabetic eye exam (see order)     Vital Signs:  Patient profile:   60 year old female Height:      64 inches (162.56 cm) Weight:      170.06 pounds (77.30 kg) BMI:     29.30 Temp:     96.9 degrees F (36.06 degrees C) oral BP sitting:   120 / 72  (right arm) Cuff size:   regular  Vitals Entered By: Sander Nephew RN (November 08, 2009 2:51 PM)     Process Orders Check Orders Results:     [facility]: Check successful Tests  Sent for requisitioning (November 12, 2009 1:05 PM):     11/08/2009: [facility] -- T-Urine Microalbumin w/creat. ratio [[id number]] (signed)    Laboratory Results   Blood Tests  Date/Time Received: November 08, 2009 5:01 PM Date/Time Reported: Maryan Rued  November 08, 2009 5:01 PM   HGBA1C: 7.3%   (Normal Range: Non-Diabetic - 3-6%   Control Diabetic - 6-8%)

## 2010-03-12 NOTE — Progress Notes (Signed)
Summary: page 4  Phone Note Refill Request   Refills Requested: Medication #1:  INSULIN SYRINGE 31G X 5/16" 0.3 ML MISC use to inject lantus insulin once daily   Dosage confirmed as above?Dosage Confirmed   Supply Requested: 1 year  Medication #2:  ONETOUCH ULTRA TEST  STRP Use to test blood sugars three times a day   Dosage confirmed as above?Dosage Confirmed   Supply Requested: 1 year  Medication #3:  LISINOPRIL 5 MG TABS Take 1 tablet by mouth once a day.   Dosage confirmed as above?Dosage Confirmed   Supply Requested: 1 year Initial call taken by: Freddy Finner RN,  February 26, 2009 4:21 PM    Prescriptions: LISINOPRIL 5 MG TABS (LISINOPRIL) Take 1 tablet by mouth once a day  #30 x 11   Entered and Authorized by:   [name] MD   Signed by:   [name] MD on 02/26/2009   Method used:   Faxed to ...       [facility] (mail-order)       [address] [place], [place]  [address]       Ph: [phone]       Fax: [phone]   RxID:   [id number] INSULIN SYRINGE 31G X 5/16" 0.3 ML MISC (INSULIN SYRINGE-NEEDLE U-100) use to inject lantus insulin once daily  #100 x 11   Entered and Authorized by:   [name] MD   Signed by:   [name] MD on 02/26/2009   Method used:   Faxed to ...       [facility] (mail-order)       [address] [place], [place]  [address]       Ph: [phone]       Fax: [phone]   RxID:   [id number] Donald Siva TEST  STRP (GLUCOSE BLOOD) Use to test blood sugars three times a day  #90 x 11   Entered and Authorized by:   [name] MD   Signed by:   [name] MD on 02/26/2009   Method used:   Faxed to ...       [facility] (mail-order)       [address] [place], [place]  [address]       Ph: [phone]       Fax: [phone]   RxID:   [id number]

## 2010-03-12 NOTE — Medication Information (Signed)
Summary: [employer] [facility]   Imported By: Lacy Duverney 08/30/2009 14:44:59  _____________________________________________________________________  External Attachment:    Type:   Image     Comment:   External Document

## 2010-03-12 NOTE — Miscellaneous (Signed)
Summary: hosp d/c  Hospital Discharge  Date of admission: 03/26/09  Date of discharge: 03/27/09  Brief reason for admission/active problems: Pt was admitted for flu-like symptoms (cough, fever to 102 in clinic, muscle aches) for supportive care. CXR did not show PNA. The patient remained afebrile throughout hospitalization.  Followup needed: The patient was encouraged to make an appointment at the [facility] as needed. It was not felt necessary for her to have a follow-up appointment if the patient continues to feel better. The patient was discharged with Tamiflu for 5 days. I was unable to update her medication list as there is still an outstanding clinic note that has not been signed.  The medication and problem lists have been updated.  Please see the dictated discharge summary for details.   Complete Medication List: 1)  Prilosec Otc 20 Mg Tbec (Omeprazole magnesium) .... Take two tablets by mouth daily 2)  Alprazolam 0.5 Mg Tabs (Alprazolam) .... One pill daily as needed for anxiety 3)  Albuterol 90 Mcg/act Aers (Albuterol) .... Two puffs every four hours as needed for shortness of breath 4)  Hydrochlorothiazide 25 Mg Tabs (Hydrochlorothiazide) .... Take 1 tablet by mouth once a day 5)  Spiriva Handihaler 18 Mcg Caps (Tiotropium bromide monohydrate) .... Inhale contents of 1 capsule once a day 6)  Crestor 20 Mg Tabs (Rosuvastatin calcium) .... Take 1 tablet by mouth once a day 7)  Advair Diskus 250-50 Mcg/dose Misc (Fluticasone-salmeterol) .Marland Kitchen.. 1 puff twice daily 8)  Glucophage Xr 500 Mg Xr24h-tab (Metformin hcl) .... 2 pills twice a day 9)  Cymbalta 30 Mg Cpep (Duloxetine hcl) .... Take 3  tables by mouth once daily 10)  Aspir-low 81 Mg Tbec (Aspirin) .... Take 1 tablet by mouth once a day 11)  Glipizide Xl 10 Mg Tb24 (Glipizide) .... Take 1 tablet by mouth twice a day 12)  Hydrocodone-ibuprofen 7.5-200 Mg Tabs (Hydrocodone-ibuprofen) .... Take one tab by mouth once every 6 hours as needed  for pain 13)  Lantus 100 Unit/ml Soln (Insulin glargine) .... Inject 30 units subcutaneously daily and increase until fasting blood sugars are mostly less than 130 mg/dl 14)  Onetouch Ultra Test Strp (Glucose blood) .... Use to test blood sugars three times a day 15)  Insulin Syringe 31g X 5/16" 0.3 Ml Misc (Insulin syringe-needle u-100) .... Use to inject lantus insulin once daily 16)  Lisinopril 5 Mg Tabs (Lisinopril) .... Take 1 tablet by mouth once a day 17)  Mucinex 600 Mg Xr12h-tab (Guaifenesin) .... Take 1 tablet twice daily for 10-14 days   Patient Instructions: 1)  Please schedule a follow-up appointment at the Outpatient Clinic as needed, particularly if your symptoms do not improve within the next several days. 2)  We have prescribed for you a medication called Tamiflu, which helps treat flu symptoms. Please take this as directed for 5 days.

## 2010-03-12 NOTE — Progress Notes (Signed)
Summary: Prior Authorization of Omeprazole 40 mg  Phone Note Outgoing Call   Call placed by: Sander Nephew RN,  Jul 10, 2009 11:23 AM Call placed to: Insurer Summary of Call: Call for Prior Authorization on Omeprazole 40 mg tablets.  Form to be faxed to request Prior Authorization of.  Pt has exceeded the plan # 13 for 1 year.  Faxed form wa received and placed in Dr. Gardenia Phlegm box for completion.  Pt can get Omeprazole 20 mg tablets # 60 while process for Prior Authorization of the 40 mg is pending.  Will need ok to call in prescription for Omeprazole 20 mg tablets. Sander Nephew RN  Jul 10, 2009 11:29 AM  Initial call taken by: Sander Nephew RN,  Jul 10, 2009 11:29 AM  Follow-up for Phone Call        that would be [facility] with me  thank you please let me know if I need to do anything thanks iskra Follow-up by: Trinidad Curet MD,  Jul 10, 2009 5:19 PM    Prescriptions: PRILOSEC 40 MG CPDR (OMEPRAZOLE) Take 1 tablet by mouth once a day  #30 x 3   Entered and Authorized by:   Trinidad Curet MD   Signed by:   Trinidad Curet MD on 07/10/2009   Method used:   Faxed to ...       [facility] (mail-order)       [address] [place], [place]  [address]       Ph: [phone]       Fax: [phone]   RxID:   [id number] PRILOSEC 40 MG CPDR (OMEPRAZOLE) Take 1 tablet by mouth once a day  #30 x 3   Entered and Authorized by:   Trinidad Curet MD   Signed by:   Trinidad Curet MD on 07/10/2009   Method used:   Print then Give to Patient   RxID:   [id number]

## 2010-03-12 NOTE — Progress Notes (Signed)
Summary: refill/gg  Phone Note Refill Request  on September 30, 2006 10:34 AM  Refills Requested: Medication #1:  SPIRIVA HANDIHALER 18 MCG CAPS Inhale contents of 1 capsule once a day   Last Refilled: 06/12/2006 Initial call taken by: Gevena Cotton RN,  September 30, 2006 10:35 AM  Follow-up for Phone Call        Refill approved-nurse to complete.  Note that this was last filled three months ago.   Follow-up by: Lucy Chris MD,  September 30, 2006 12:00 PM  Additional Follow-up for Phone Call Additional follow up Details #1::       Additional Follow-up by: Gevena Cotton RN,  September 30, 2006 12:05 PM      Prescriptions: SPIRIVA HANDIHALER 18 MCG CAPS (TIOTROPIUM BROMIDE MONOHYDRATE) Inhale contents of 1 capsule once a day  #1 x 5   Entered and Authorized by:   Lucy Chris MD   Signed by:   Lucy Chris MD on 09/30/2006   Method used:   Electronically sent to ...       [facility]       [place], [place]  [address]       Ph:        Fax:    RxID:   [id number]

## 2010-03-12 NOTE — Progress Notes (Signed)
Summary: med refill/gp  Phone Note Refill Request Message from:  Fax from Pharmacy on December 05, 2009 9:28 AM  Refills Requested: Medication #1:  NORCO 10-325 MG TABS Take 1 tablet by mouth four times a day as needed pain Last appt. 10/14.   Method Requested: Telephone to Pharmacy Initial call taken by: Morrison Old RN,  December 05, 2009 9:28 AM  Follow-up for Phone Call        completed refill, thank you Iskra  Follow-up by: Trinidad Curet MD,  December 05, 2009 9:57 AM    Prescriptions: NORCO 10-325 MG TABS (HYDROCODONE-ACETAMINOPHEN) Take 1 tablet by mouth four times a day as needed pain  #120 x 0   Entered and Authorized by:   Trinidad Curet MD   Signed by:   Trinidad Curet MD on 12/05/2009   Method used:   Print then Give to Patient   RxID:   [id number]   Appended Document: med refill/gp Norco Rx faxed to [facility].

## 2010-03-12 NOTE — Letter (Signed)
Summary: Handout Printed  Printed Handout:  - *Patient Instructions 

## 2010-03-12 NOTE — Miscellaneous (Signed)
Summary: corrected diabetes type on problem list/dmr  Clinical Lists Changes  Problems: Removed problem of DIABETES MELLITUS, TYPE I (ICD-250.01) Added new problem of AODM (ICD-250.00)

## 2010-03-12 NOTE — Assessment & Plan Note (Signed)
Summary: poss u/a, toothache/pcp-magick/hla   Vital Signs:  Patient profile:   60 year old female Height:      64 inches (162.56 cm) Weight:      174.7 pounds (79.36 kg) BMI:     30.08 Temp:     97.7 degrees F (36.50 degrees C) oral Pulse rate:   93 / minute BP sitting:   151 / 84  (right arm) Cuff size:   regular  Vitals Entered By: Lucky Rathke NT II (November 15, 2008 9:29 AM) CC: PATIENT STATES SHE IS HERE FOR TOOTH PAIN(ON DENTAL WAITING LIST)  ? UA Is Patient Diabetic? Yes  Pain Assessment Patient in pain? yes     Location: TOOTH Intensity:     10 Type: THROB Nutritional Status BMI of 25 - 29 = overweight  Have you ever been in a relationship where you felt threatened, hurt or afraid?No   Does patient need assistance? Functional Status Self care Ambulation Normal Comments PATIENT STATES SHE IS HERE FOR TOOTH PAIN(IS ON DENTAL WAITING LIST) ? UA   Primary Care Provider:  Trinidad Curet MD  CC:  PATIENT STATES SHE IS HERE FOR TOOTH PAIN(ON DENTAL WAITING LIST)  ? UA.  History of Present Illness: 60 yr old caucasian female with PMH as mentioned below comes to the office for CC of dental pain that started few days ago. Patient reports severe 10/10 pain in her right maxillary teeth. She denies any fevers, chills. She reports that she has very bad teeth and is seeing [facility] who recommended to remove all her teeth. She is on the waiting list for this and reports that she has an appointment with a dentist this month. She denies any other complaints.   Preventive Screening-Counseling & Management  Alcohol-Tobacco     Smoking Status: current     Smoking Cessation Counseling: yes     Packs/Day: 1.5     Year Started: AT THE AGE OF 12  Caffeine-Diet-Exercise     Does Patient Exercise: no  Problems Prior to Update: 1)  Hypertension  (ICD-401.9) 2)  Shingles  (ICD-053.9) 3)  Leukocytosis  (ICD-288.60) 4)  Vaginal Pruritus  (ICD-698.1) 5)  Anemia   (ICD-285.9) 6)  Arm Pain, Left  (ICD-729.5) 7)  Gerd  (ICD-530.81) 8)  Leg Pain  (ICD-729.5) 9)  Bunion  (ICD-727.1) 10)  Sinusitis, Acute Maxillary  (ICD-461.0) 11)  Aodm  (ICD-250.00) 12)  Hematuria, Hx of  (ICD-V13.09) 13)  Facial Flushing  (ICD-782.62) 14)  Hematochezia  (ICD-578.1) 15)  Obesity Nos  (ICD-278.00) 16)  Hypertriglyceridemia  (ICD-272.1) 17)  Low Back Pain  (ICD-724.2) 18)  Arthropathy Nec, Other Spec Site  (ICD-716.88) 19)  Tobacco Abuse  (ICD-305.1) 20)  Colonic Polyps  (ICD-211.3) 21)  Obstructive Sleep Apnea  (ICD-780.57) 22)  Irritable Bowel Syndrome  (ICD-564.1) 23)  Fibromyalgia  (ICD-729.1) 24)  Dyslipidemia  (ICD-272.4) 25)  Pulmonary Hypertension  (ICD-416.8) 26)  Anxiety  (ICD-300.00) 27)  Osteoporosis  (ICD-733.00) 28)  Depression  (ICD-311) 29)  COPD  (ICD-496)  Medications Prior to Update: 1)  Prilosec Otc 20 Mg  Tbec (Omeprazole Magnesium) .... Take Two Tablets By Mouth Daily 2)  Alprazolam 0.5 Mg  Tabs (Alprazolam) .... One Pill Daily As Needed For Anxiety 3)  Albuterol 90 Mcg/act Aers (Albuterol) .... Two Puffs Every Four Hours As Needed For Shortness of Breath 4)  Hydrochlorothiazide 25 Mg Tabs (Hydrochlorothiazide) .... Take 1 Tablet By Mouth Once A Day 5)  Spiriva  Handihaler 18 Mcg Caps (Tiotropium Bromide Monohydrate) .... Inhale Contents of 1 Capsule Once A Day 6)  Crestor 20 Mg  Tabs (Rosuvastatin Calcium) .... Take 1 Tablet By Mouth Once A Day 7)  Advair Diskus 250-50 Mcg/dose Misc (Fluticasone-Salmeterol) .Marland Kitchen.. 1 Puff Twice Daily 8)  Glucophage Xr 500 Mg Xr24h-Tab (Metformin Hcl) .... 2 Pills Twice A Day 9)  Cymbalta 30 Mg Cpep (Duloxetine Hcl) .... Take 3  Tables By Mouth Once Daily 10)  Aspir-Low 81 Mg Tbec (Aspirin) 11)  Glipizide Xl 10 Mg  Tb24 (Glipizide) .... Take 1 Tablet By Mouth Twice A Day 12)  Hydrocodone-Ibuprofen 7.5-200 Mg Tabs (Hydrocodone-Ibuprofen) .... Take One Tab By Mouth Once Every 6 Hours As Needed For Pain 13)   Loratadine 10 Mg  Tabs (Loratadine) .... Take 1 Tablet By Mouth Once A Day 14)  Ferrous Sulfate 325 (65 Fe) Mg  Tbec (Ferrous Sulfate) .... Take 1 Tablet By Mouth Three Times A Day 15)  Lantus 100 Unit/ml Soln (Insulin Glargine) .... Inject 30 Units Subcutaneously Daily and Increase Until Fasting Blood Sugars Are Mostly Less Than 130 Mg/dl 16)  Onetouch Ultra Test  Strp (Glucose Blood) .... Use To Test Blood Sugars Three Times A Day 17)  Insulin Syringe 31g X 5/16" 0.3 Ml Misc (Insulin Syringe-Needle U-100) .... Use To Inject Lantus Insulin Once Daily 18)  Novolog 100 Unit/ml Soln (Insulin Aspart) .... Inject 5 Units With Each Meal.  Allergies (verified): 1)  ! Ultram 2)  ! Morphine  Past History:  Past Medical History: Last updated: 03/02/2007 COPD Depression Diabetes mellitus, type I Osteoporosis Anxiety Low back pain Chronic diffuse pain involving the back, arms, and legs  Review of Systems      See HPI  Physical Exam  General:  alert and overweight-appearing.  NAD Head:  normocephalic and atraumatic.   Eyes:  vision grossly intact, pupils equal, pupils round, and pupils reactive to light.   Nose:  no external erythema.   Mouth:  pharynx pink and moist, no exudates, no posterior lymphoid hypertrophy, poor dentition, and teeth missing. the mucosa surrounding the right maxillary teeth is erthematous but there is no sign of dental abscess or pus drainage. Lungs:  normal respiratory effort, no accessory muscle use, normal breath sounds, no crackles, and no wheezes.   Heart:  normal rate, regular rhythm, no murmur, no gallop, and no rub.   Abdomen:  soft, non-tender, normal bowel sounds, and no distention.   Pulses:  R radial normal.   Extremities:  No clubbing, cyanosis, edema, or deformity noted with normal full range of motion of all joints.   Neurologic:  alert & oriented X3, strength normal in all extremities, and gait normal.     Impression & Recommendations:  Problem #  1:  DENTAL PAIN (ICD-525.9) Likely secondary to possible dental caries with super added infection. No signs of abscess. All the teeth are in a bad shape but the current pain is related only to herright maxially teeth. She is following with dental and reports that she is on the waiting list for dental extraction.Will treat her with Augmentin for 10 days and continue her hydrocodone for pain. She still has hydrocodone at home. Will follow up.  Problem # 2:  HYPERTENSION (ICD-401.9)  BP elevated but in the setting of severe 10/10 dental pain, i would not change any of her medications at this point. Patient is scheduled another appt with Dr.Magick this month. Will follow up.  Her updated medication list for this  problem includes:    Hydrochlorothiazide 25 Mg Tabs (Hydrochlorothiazide) .Marland Kitchen... Take 1 tablet by mouth once a day  BP today: 151/84 Prior BP: 140/76 (06/15/2008)  Labs Reviewed: K+: 4.0 (06/15/2008) Creat: : 0.77 (06/15/2008)   Chol: 127 (04/18/2008)   HDL: 32 (04/18/2008)   LDL: 51 (04/18/2008)   TG: 222 (04/18/2008)   Her updated medication list for this problem includes:    Hydrochlorothiazide 25 Mg Tabs (Hydrochlorothiazide) .Marland Kitchen... Take 1 tablet by mouth once a day    Problem # 3:  AODM (ICD-250.00) Will check herA1C. Patient is in severe dental pain and is not in a mood to discuss about her cbg's or to adjust her insulin. She has an appointment with Dr. Donnella Bi on oct 21st. Will order A1C, FLP, urine microalbumin.Will follow up. Orders: T-Hgb A1C (in-house) ([id number]) T-Urine Microalbumin w/creat. ratio [id number])   Her updated medication list for this problem includes:    Glucophage Xr 500 Mg Xr24h-tab (Metformin hcl) .Marland Kitchen... 2 pills twice a day    Aspir-low 81 Mg Tbec (Aspirin)    Glipizide Xl 10 Mg Tb24 (Glipizide) .Marland Kitchen... Take 1 tablet by mouth twice a day    Lantus 100 Unit/ml Soln (Insulin glargine) ..... Inject 30 units subcutaneously daily and increase until  fasting blood sugars are mostly less than 130 mg/dl    Novolog 100 Unit/ml Soln (Insulin aspart) ..... Inject 5 units with each meal.    Complete Medication List: 1)  Prilosec Otc 20 Mg Tbec (Omeprazole magnesium) .... Take two tablets by mouth daily 2)  Alprazolam 0.5 Mg Tabs (Alprazolam) .... One pill daily as needed for anxiety 3)  Albuterol 90 Mcg/act Aers (Albuterol) .... Two puffs every four hours as needed for shortness of breath 4)  Hydrochlorothiazide 25 Mg Tabs (Hydrochlorothiazide) .... Take 1 tablet by mouth once a day 5)  Spiriva Handihaler 18 Mcg Caps (Tiotropium bromide monohydrate) .... Inhale contents of 1 capsule once a day 6)  Crestor 20 Mg Tabs (Rosuvastatin calcium) .... Take 1 tablet by mouth once a day 7)  Advair Diskus 250-50 Mcg/dose Misc (Fluticasone-salmeterol) .Marland Kitchen.. 1 puff twice daily 8)  Glucophage Xr 500 Mg Xr24h-tab (Metformin hcl) .... 2 pills twice a day 9)  Cymbalta 30 Mg Cpep (Duloxetine hcl) .... Take 3  tables by mouth once daily 10)  Aspir-low 81 Mg Tbec (Aspirin) 11)  Glipizide Xl 10 Mg Tb24 (Glipizide) .... Take 1 tablet by mouth twice a day 12)  Hydrocodone-ibuprofen 7.5-200 Mg Tabs (Hydrocodone-ibuprofen) .... Take one tab by mouth once every 6 hours as needed for pain 13)  Loratadine 10 Mg Tabs (Loratadine) .... Take 1 tablet by mouth once a day 14)  Ferrous Sulfate 325 (65 Fe) Mg Tbec (Ferrous sulfate) .... Take 1 tablet by mouth three times a day 15)  Lantus 100 Unit/ml Soln (Insulin glargine) .... Inject 30 units subcutaneously daily and increase until fasting blood sugars are mostly less than 130 mg/dl 16)  Onetouch Ultra Test Strp (Glucose blood) .... Use to test blood sugars three times a day 17)  Insulin Syringe 31g X 5/16" 0.3 Ml Misc (Insulin syringe-needle u-100) .... Use to inject lantus insulin once daily 18)  Novolog 100 Unit/ml Soln (Insulin aspart) .... Inject 5 units with each meal. 19)  Augmentin 875-125 Mg Tabs (Amoxicillin-pot  clavulanate) .... Take 1 tablet by mouth two times a day for 10 days T-Hgb A1C (in-house) ([id number]) T-Urine Microalbumin w/creat. ratio ([phone]) T-Lipid Profile ([id number])  Patient Instructions: 1)  Followup with Dr. Donnella Bi  on October 21 as previously scheduled. 2)  Take all the medications as advised below. 3)  Check your blood sugars regularly. If your readings are usually above : 300 or below 70 you should contact our office. Prescriptions: AUGMENTIN 875-125 MG TABS (AMOXICILLIN-POT CLAVULANATE) Take 1 tablet by mouth two times a day for 10 days  #20 x 0   Entered and Authorized by:   Yolonda Kida MD   Signed by:   Yolonda Kida MD on 11/15/2008   Method used:   Print then Give to Patient   RxID:   [id number]   Prevention & Chronic Care Immunizations   Influenza vaccine: Fluvax MCR  (12/27/2007)   Influenza vaccine due: 11/27/2006    Tetanus booster: 10/19/2006: Td   Tetanus booster due: 10/18/2016    Pneumococcal vaccine: Not documented    Immunization comments: received flu vaccine outside,scheduled for mammogram  Colorectal Screening   Hemoccult: Not documented   Hemoccult action/deferral: Deferred  (11/15/2008)    Colonoscopy: normal  (08/18/2005)   Colonoscopy due: 08/19/2015  Other Screening   Pap smear:  Specimen Adequacy: Unsatisfactory for evaluation.  Interpretation/Result:Negative for intraepithelial Lesion or Malignancy.     (06/29/2007)   Pap smear action/deferral: GYN Referral  (11/15/2008)   Pap smear due: 07/2008    Mammogram: Assessment: BIRADS 1.   (08/03/2007)   Mammogram due: 08/02/2008   Smoking status: current  (11/15/2008)   Smoking cessation counseling: yes  (11/15/2008)  Diabetes Mellitus   HgbA1C: 8.2  (04/18/2008)   HgbA1C action/deferral: Ordered  (11/15/2008)   Hemoglobin A1C due: 08/26/2006    Eye exam: No diabetic retinopathy.     (09/09/2007)   Eye exam due: 09/2008    Foot exam: Not documented   High  risk foot: Not documented   Foot care education: Not documented    Urine microalbumin/creatinine ratio: 26.2  (03/24/2007)   Urine microalbumin action/deferral: Ordered   Urine microalbumin/cr due: 08/26/2006  Lipids   Total Cholesterol: 127  (04/18/2008)   Lipid panel action/deferral: Lipid Panel ordered   LDL: 51  (04/18/2008)   LDL Direct: Not documented   HDL: 32  (04/18/2008)   Triglycerides: 222  (04/18/2008)   Lipid panel due: 11/27/2006    SGOT (AST): 23  (04/18/2008)   SGPT (ALT): 26  (04/18/2008)   Alkaline phosphatase: 43  (04/18/2008)   Total bilirubin: 0.3  (04/18/2008)  Hypertension   Last Blood Pressure: 151 / 84  (11/15/2008)   Serum creatinine: 0.77  (06/15/2008)   Serum potassium 4.0  (06/15/2008)  Self-Management Support :    Diabetes self-management support: Not documented   Last diabetes self-management training by diabetes educator: 05/01/2008    Hypertension self-management support: Not documented    Lipid self-management support: Not documented    Nursing Instructions: Gyn referral for screening Pap (see order) HgbA1C today (see order)   Laboratory Results   Urine Tests  Date/Time Received: 10-6-010                                9:43  Routine Urinalysis   Color: yellow Appearance: Clear Glucose: negative   (Normal Range: Negative) Bilirubin: negative   (Normal Range: Negative) Ketone: negative   (Normal Range: Negative) Spec. Gravity: 1.025   (Normal Range: 1.003-1.035) Blood: small   (Normal Range: Negative) pH: 5.5   (Normal Range: 5.0-8.0) Protein: trace   (Normal Range: Negative) Urobilinogen: 1.0   (Normal Range: 0-1) Nitrite:  negative   (Normal Range: Negative) Leukocyte Esterace: negative   (Normal Range: Negative)        Impression & Recommendations:  Orders: Admin of Therapeutic Inj  intramuscular or subcutaneous ([id number]) Ketorolac-Toradol 61m (([id number]   Her updated medication list for this problem includes:     Hydrochlorothiazide 25 Mg Tabs (Hydrochlorothiazide) ..Marland Kitchen.. Take 1 tablet by mouth once a day   Her updated medication list for this problem includes:    Glucophage Xr 500 Mg Xr24h-tab (Metformin hcl) ..Marland Kitchen.. 2 pills twice a day    Aspir-low 81 Mg Tbec (Aspirin)    Glipizide Xl 10 Mg Tb24 (Glipizide) ..Marland Kitchen.. Take 1 tablet by mouth twice a day    Lantus 100 Unit/ml Soln (Insulin glargine) ..... Inject 30 units subcutaneously daily and increase until fasting blood sugars are mostly less than 130 mg/dl    Novolog 100 Unit/ml Soln (Insulin aspart) ..... Inject 5 units with each meal.  Orders: T-Hgb A1C (in-house) (([id number] T-Urine Microalbumin w/creat. ratio ([phone]   Complete Medication List: 1)  Prilosec Otc 20 Mg Tbec (Omeprazole magnesium) .... Take two tablets by mouth daily 2)  Alprazolam 0.5 Mg Tabs (Alprazolam) .... One pill daily as needed for anxiety 3)  Albuterol 90 Mcg/act Aers (Albuterol) .... Two puffs every four hours as needed for shortness of breath 4)  Hydrochlorothiazide 25 Mg Tabs (Hydrochlorothiazide) .... Take 1 tablet by mouth once a day 5)  Spiriva Handihaler 18 Mcg Caps (Tiotropium bromide monohydrate) .... Inhale contents of 1 capsule once a day 6)  Crestor 20 Mg Tabs (Rosuvastatin calcium) .... Take 1 tablet by mouth once a day 7)  Advair Diskus 250-50 Mcg/dose Misc (Fluticasone-salmeterol) ..Marland Kitchen. 1 puff twice daily 8)  Glucophage Xr 500 Mg Xr24h-tab (Metformin hcl) .... 2 pills twice a day 9)  Cymbalta 30 Mg Cpep (Duloxetine hcl) .... Take 3  tables by mouth once daily 10)  Aspir-low 81 Mg Tbec (Aspirin) 11)  Glipizide Xl 10 Mg Tb24 (Glipizide) .... Take 1 tablet by mouth twice a day 12)  Hydrocodone-ibuprofen 7.5-200 Mg Tabs (Hydrocodone-ibuprofen) .... Take one tab by mouth once every 6 hours as needed for pain 13)  Loratadine 10 Mg Tabs (Loratadine) .... Take 1 tablet by mouth once a day 14)  Ferrous Sulfate 325 (65 Fe) Mg Tbec (Ferrous sulfate) .... Take 1  tablet by mouth three times a day 15)  Lantus 100 Unit/ml Soln (Insulin glargine) .... Inject 30 units subcutaneously daily and increase until fasting blood sugars are mostly less than 130 mg/dl 16)  Onetouch Ultra Test Strp (Glucose blood) .... Use to test blood sugars three times a day 17)  Insulin Syringe 31g X 5/16" 0.3 Ml Misc (Insulin syringe-needle u-100) .... Use to inject lantus insulin once daily 18)  Novolog 100 Unit/ml Soln (Insulin aspart) .... Inject 5 units with each meal. 19)  Augmentin 875-125 Mg Tabs (Amoxicillin-pot clavulanate) .... Take 1 tablet by mouth two times a day for 10 days  Other Orders: T-Lipid Profile (([id number]     Medication Administration  Injection # 1:    Medication: Ketorolac-Toradol 138m   Diagnosis: DENTAL PAIN (ICD-525.9)    Route: IM    Site: R deltoid    Exp Date: 06/11/2010    Lot #: [id number]  Mfr: Novaplus    Comments: Pain level #10    Patient tolerated injection without complications    Given by: GlMorrison OldN (November 15, 2008 10:16 AM)  Orders Added: 1)  T-Hgb  A1C (in-house) [[id number]] 2)  T-Urine Microalbumin w/creat. ratio [[id number]] 3)  T-Lipid Profile [[id number]] 4)  Est. Patient Level III [67227]   Appended Document: Lab Order    Lab Visit  Laboratory Results   Blood Tests   Date/Time Received: November 15, 2008 10:50 AM  Date/Time Reported: Melvia Heaps  November 15, 2008 10:50 AM   HGBA1C: 5.8%   (Normal Range: Non-Diabetic - 3-6%   Control Diabetic - 6-8%)    Orders Today:   Appended Document: poss u/a, toothache/pcp-magick/hla With an A1C that low suspect many low CBG's. Will consider decreasing the dose of Insulin. Patient didnt bring her glucometer to the office today. Will convey this to [place].

## 2010-03-12 NOTE — Progress Notes (Signed)
Summary: cpap/ hla  Phone Note Call from Patient   Summary of Call: pt calls to say she talked w/ you at last visit about new sleep study for continued cpap, i don't see a referral, please advise Initial call taken by: Freddy Finner RN,  April 18, 2009 3:58 PM  Follow-up for Phone Call        I put in the order for it, so do you think we can go ahead and arrange this, do I need to call anybody  can you please let me know Follow-up by: Trinidad Curet MD,  April 18, 2009 5:30 PM

## 2010-03-12 NOTE — Progress Notes (Signed)
Summary: CHANGING PHARMACY/ [facility]  Phone Note Refill Request Message from:  Pharmacy on February 26, 2009 3:30 PM  Refills Requested: Medication #1:  PRILOSEC OTC 20 MG  TBEC Take two tablets by mouth daily   Dosage confirmed as above?Dosage Confirmed   Supply Requested: 1 year  Medication #2:  ALPRAZOLAM 0.5 MG  TABS one pill daily as needed for anxiety   Dosage confirmed as above?Dosage Confirmed   Supply Requested: 6 months   Last Refilled: 12/14  Medication #3:  ALBUTEROL 90 MCG/ACT AERS Two puffs every four hours as needed for shortness of breath   Dosage confirmed as above?Dosage Confirmed   Supply Requested: 1 year  Medication #4:  HYDROCHLOROTHIAZIDE 25 MG TABS Take 1 tablet by mouth once a day   Dosage confirmed as above?Dosage Confirmed   Supply Requested: 1 year Initial call taken by: Freddy Finner RN,  February 26, 2009 3:37 PM  Follow-up for Phone Call        Please schedule with Dr. Donnella Bi at her next available non-overbook appointment to discuss the indications and document the reasons for further prescribing of the hydrocodone.  This appointment must be with Dr. Donnella Bi so it can wait until she has an opening.  Thank You. Follow-up by: [name] MD,  February 26, 2009 4:54 PM  Additional Follow-up for Phone Call Additional follow up Details #1::        done Additional Follow-up by: Freddy Finner RN,  March 06, 2009 9:55 AM    New/Updated Medications: ALPRAZOLAM 0.5 MG  TABS (ALPRAZOLAM) one pill daily as needed for anxiety Prescriptions: HYDROCODONE-IBUPROFEN 7.5-200 MG TABS (HYDROCODONE-IBUPROFEN) Take one tab by mouth once every 6 hours as needed for pain  #120 x 0   Entered and Authorized by:   [name] MD   Signed by:   [name] MD on 02/26/2009   Method used:   Printed then faxed to ...       [facility] (mail-order)       [address] [place], [place]  [address]       Ph: [phone]       Fax: [phone]  RxID:   [id number] ALPRAZOLAM 0.5 MG  TABS (ALPRAZOLAM) one pill daily as needed for anxiety  #30 x 5   Entered and Authorized by:   [name] MD   Signed by:   [name] MD on 02/26/2009   Method used:   Printed then faxed to ...       [facility] (mail-order)       [address] [place], [place]  [address]       Ph: [phone]       Fax: [phone]   RxID:   [id number] HYDROCHLOROTHIAZIDE 25 MG TABS (HYDROCHLOROTHIAZIDE) Take 1 tablet by mouth once a day  #30 x 11   Entered and Authorized by:   [name] MD   Signed by:   [name] MD on 02/26/2009   Method used:   Printed then faxed to ...       [facility] (mail-order)       [address] [place], [place]  [address]       Ph: [phone]       Fax: [phone]   RxID:   [id number] ALBUTEROL 90 MCG/ACT AERS (ALBUTEROL) Two puffs every four hours as  needed for shortness of breath  #1 x 11   Entered and Authorized by:   [name] MD   Signed by:   [name] MD on 02/26/2009   Method used:   Printed then faxed to ...       [facility] (mail-order)       [address] [place], [place]  [address]       Ph: [phone]       Fax: [phone]   RxID:   [id number] PRILOSEC OTC 20 MG  TBEC (OMEPRAZOLE MAGNESIUM) Take two tablets by mouth daily  #60 x 11   Entered and Authorized by:   [name] MD   Signed by:   [name] MD on 02/26/2009   Method used:   Printed then faxed to ...       [facility] (mail-order)       [address] [place], [place]  [address]       Ph: [phone]       Fax: [phone]   [place]:   [phone]

## 2010-03-12 NOTE — Progress Notes (Signed)
Summary: lantus dose/dmr  Phone Note Call from Patient Call back at Home Phone [phone]   Caller: Patient Summary of Call: needs help, discharged from hospital off diabetes pills and on 25 units lantus, she increased it herself to 30 units herself, checking blood sugars when sweating so bad she can't stand it. blood sugars consistently >123m/dl and often in the 200-300s. Flushing is bothering her the most. Patient reports proper storage and discarsing of insulin. fastngs  ~171 , 162, 145, 214  later in day -258, 263, 183 before eating   willing to give herself novolog rather than go back on pills to get better contorl of her diabetes:Reviewed hospital record and patient blood sugars were fairly well controlled on 25 units lantus and 5-6 units Novolog/day. Since patient lantu sorder already allows self titration- advised patient to increase  lantus  1 unit each night until fasting blood sugars 100-130 range and call [place] Lantus dose.   Initial call taken by: DBarnabas HarriesRD,CDE,  November 02, 2009 8:48 AM  Follow-up for Phone Call        I agree with your plan DButch Penny please let me know if there is anything else I can do? Thanks iskra Follow-up by: ITrinidad CuretMD,  November 04, 2009 6:57 PM    New/Updated Medications: LANTUS 100 UNIT/ML SOLN (INSULIN GLARGINE) Inject 30 units subcutaneously daily and increase until fasting blood sugars are less than 130 mg/dl

## 2010-03-12 NOTE — Progress Notes (Signed)
Summary: WALK-IN/ [facility]  Phone Note Call from Patient   Reason for Call: Acute Illness Summary of Call: pt states awoke 6/16 w/ low R back pain growing progressively worse she used some vicodin 6/16 but it has continued, this am she can not tolerate sitting and the pain is constant, pt verbalizes that she has had a kidney stone in the past. she is added to dr silwal sch Initial call taken by: Freddy Finner RN,  July 28, 2007 8:58 AM

## 2010-03-12 NOTE — Progress Notes (Signed)
Summary: phone/gg  Phone Note Call from Patient   Caller: Patient Summary of Call: Pt called c/o pain to top of left arm on and off x 1 month. Last night she had  numbness and tingling from neck, down arm to fingers.  THis lasted about 1 hour.  Since that time she has a different feeling in her arm. No other c/o.  I told pt we could see her today but she has transportation issues.  She will call if she can make it in today Initial call taken by: Gevena Cotton RN,  March 02, 2007 11:11 AM  Follow-up for Phone Call        Dr. Baron Hamper is seeing the patient now. Follow-up by: Alcide Evener MD,  March 02, 2007 12:43 PM

## 2010-03-12 NOTE — Assessment & Plan Note (Signed)
Summary: NOT A HFU/OKAY PER [facility]OR[facility]/CFB   Vital Signs:  Patient profile:   60 year old female Height:      64 inches (162.56 cm) Weight:      177.6 pounds (79.41 kg) BMI:     30.10 Temp:     97.1 degrees F (36.17 degrees C) oral Pulse rate:   87 / minute BP sitting:   116 / 70  (right arm) Cuff size:   regular  Vitals Entered By: Lucky Rathke NT II (November 30, 2008 1:47 PM) CC: ROUTINE OFFICE VISIT /  HAS SOME ISSUES - REFERRAL FOR DM SHOES -NEED ARCHE INSERT Is Patient Diabetic? Yes  Pain Assessment Patient in pain? yes     Location: feet Intensity:   2 Type: BURN/ HURT Onset of pain  Chronic Nutritional Status BMI of > 30 = obese  Have you ever been in a relationship where you felt threatened, hurt or afraid?No   Does patient need assistance? Functional Status Self care Ambulation Normal Comments ROUTINE OFFICE VISIT / HAS SOME ISSUES /  REFERRAL FOR DM SHOES- NEEN INSERTS FOR THE SHOES / BEEN HAVING PAIN IN RIGHT KNEE / FEET TURNNG RED AND PAINFUL /  THERE ARE KNOTS ON LOWER LEGS / NEED CPAP MACHINE CHECKED AND ORDER FOR NEW CPAP FACE MASK.   Primary Care Provider:  Trinidad Curet MD  CC:  ROUTINE OFFICE VISIT /  HAS SOME ISSUES - REFERRAL FOR DM SHOES -NEED ARCHE INSERT.  History of Present Illness: 60 YO female with recent history of dental pain (right maxillary teeth), HTN, HLD, bilateral foot bunions presents today with main concern of treating her bunions and wants to have PAP Smear done. She has completed course of antibiotics given for her tooth pain and reports feeling much better. She has follow up appointment with her dentist in 2 weeks and the plan is to remove all teeth and place dentures instead. Pt otherwise reports feeling well, no major concerns, no recent sickness or hospitalizations. Denies chest pain, cough, dyspnea, no abdominal or urinary concerns, no recent changes in mood or appetite.   Preventive Screening-Counseling &  Management  Alcohol-Tobacco     Alcohol drinks/day: 0     Smoking Status: current     Smoking Cessation Counseling: yes     Packs/Day: 1.5     Year Started: AT THE AGE OF 12  Caffeine-Diet-Exercise     Does Patient Exercise: no  Problems Prior to Update: 1)  Hypertension  (ICD-401.9) 2)  Dyslipidemia  (ICD-272.4) 3)  Hypertriglyceridemia  (ICD-272.1) 4)  Microalbuminuria  (ICD-791.0) 5)  Dental Pain  (ICD-525.9) 6)  Leukocytosis  (ICD-288.60) 7)  Vaginal Pruritus  (ICD-698.1) 8)  Shingles  (ICD-053.9) 9)  Arm Pain, Left  (ICD-729.5) 10)  Anemia  (ICD-285.9) 11)  Leg Pain  (ICD-729.5) 12)  Gerd  (ICD-530.81) 13)  Sinusitis, Acute Maxillary  (ICD-461.0) 14)  Bunion  (ICD-727.1) 15)  Hematuria, Hx of  (ICD-V13.09) 16)  Facial Flushing  (ICD-782.62) 17)  Aodm  (ICD-250.00) 18)  Obesity Nos  (ICD-278.00) 19)  Hematochezia  (ICD-578.1) 20)  Low Back Pain  (ICD-724.2) 21)  Arthropathy Nec, Other Spec Site  (ICD-716.88) 22)  Tobacco Abuse  (ICD-305.1) 23)  Colonic Polyps  (ICD-211.3) 24)  Obstructive Sleep Apnea  (ICD-780.57) 25)  Pulmonary Hypertension  (ICD-416.8) 26)  Osteoporosis  (ICD-733.00) 27)  Depression  (ICD-311) 28)  COPD  (ICD-496) 29)  Anxiety  (ICD-300.00) 30)  Fibromyalgia  (ICD-729.1) 31)  Irritable Bowel Syndrome  (  ICD-564.1) 32)  Health Maintenance Exam  (ICD-V70.0)  Medications Prior to Update: 1)  Prilosec Otc 20 Mg  Tbec (Omeprazole Magnesium) .... Take Two Tablets By Mouth Daily 2)  Alprazolam 0.5 Mg  Tabs (Alprazolam) .... One Pill Daily As Needed For Anxiety 3)  Albuterol 90 Mcg/act Aers (Albuterol) .... Two Puffs Every Four Hours As Needed For Shortness of Breath 4)  Hydrochlorothiazide 25 Mg Tabs (Hydrochlorothiazide) .... Take 1 Tablet By Mouth Once A Day 5)  Spiriva Handihaler 18 Mcg Caps (Tiotropium Bromide Monohydrate) .... Inhale Contents of 1 Capsule Once A Day 6)  Crestor 20 Mg  Tabs (Rosuvastatin Calcium) .... Take 1 Tablet By Mouth Once  A Day 7)  Advair Diskus 250-50 Mcg/dose Misc (Fluticasone-Salmeterol) .Marland Kitchen.. 1 Puff Twice Daily 8)  Glucophage Xr 500 Mg Xr24h-Tab (Metformin Hcl) .... 2 Pills Twice A Day 9)  Cymbalta 30 Mg Cpep (Duloxetine Hcl) .... Take 3  Tables By Mouth Once Daily 10)  Aspir-Low 81 Mg Tbec (Aspirin) 11)  Glipizide Xl 10 Mg  Tb24 (Glipizide) .... Take 1 Tablet By Mouth Twice A Day 12)  Hydrocodone-Ibuprofen 7.5-200 Mg Tabs (Hydrocodone-Ibuprofen) .... Take One Tab By Mouth Once Every 6 Hours As Needed For Pain 13)  Loratadine 10 Mg  Tabs (Loratadine) .... Take 1 Tablet By Mouth Once A Day 14)  Ferrous Sulfate 325 (65 Fe) Mg  Tbec (Ferrous Sulfate) .... Take 1 Tablet By Mouth Three Times A Day 15)  Lantus 100 Unit/ml Soln (Insulin Glargine) .... Inject 30 Units Subcutaneously Daily and Increase Until Fasting Blood Sugars Are Mostly Less Than 130 Mg/dl 16)  Onetouch Ultra Test  Strp (Glucose Blood) .... Use To Test Blood Sugars Three Times A Day 17)  Insulin Syringe 31g X 5/16" 0.3 Ml Misc (Insulin Syringe-Needle U-100) .... Use To Inject Lantus Insulin Once Daily 18)  Novolog 100 Unit/ml Soln (Insulin Aspart) .... Inject 5 Units With Each Meal. 19)  Augmentin 875-125 Mg Tabs (Amoxicillin-Pot Clavulanate) .... Take 1 Tablet By Mouth Two Times A Day For 10 Days 20)  Lisinopril 5 Mg Tabs (Lisinopril) .... Take 1 Tablet By Mouth Once A Day  Current Medications (verified): 1)  Prilosec Otc 20 Mg  Tbec (Omeprazole Magnesium) .... Take Two Tablets By Mouth Daily 2)  Alprazolam 0.5 Mg  Tabs (Alprazolam) .... One Pill Daily As Needed For Anxiety 3)  Albuterol 90 Mcg/act Aers (Albuterol) .... Two Puffs Every Four Hours As Needed For Shortness of Breath 4)  Hydrochlorothiazide 25 Mg Tabs (Hydrochlorothiazide) .... Take 1 Tablet By Mouth Once A Day 5)  Spiriva Handihaler 18 Mcg Caps (Tiotropium Bromide Monohydrate) .... Inhale Contents of 1 Capsule Once A Day 6)  Crestor 20 Mg  Tabs (Rosuvastatin Calcium) .... Take 1  Tablet By Mouth Once A Day 7)  Advair Diskus 250-50 Mcg/dose Misc (Fluticasone-Salmeterol) .Marland Kitchen.. 1 Puff Twice Daily 8)  Glucophage Xr 500 Mg Xr24h-Tab (Metformin Hcl) .... 2 Pills Twice A Day 9)  Cymbalta 30 Mg Cpep (Duloxetine Hcl) .... Take 3  Tables By Mouth Once Daily 10)  Aspir-Low 81 Mg Tbec (Aspirin) .... Take 1 Tablet By Mouth Once A Day 11)  Glipizide Xl 10 Mg  Tb24 (Glipizide) .... Take 1 Tablet By Mouth Twice A Day 12)  Hydrocodone-Ibuprofen 7.5-200 Mg Tabs (Hydrocodone-Ibuprofen) .... Take One Tab By Mouth Once Every 6 Hours As Needed For Pain 13)  Ferrous Sulfate 325 (65 Fe) Mg  Tbec (Ferrous Sulfate) .... Take 1 Tablet By Mouth Three Times A Day 14)  Lantus 100 Unit/ml Soln (Insulin Glargine) .... Inject 30 Units Subcutaneously Daily and Increase Until Fasting Blood Sugars Are Mostly Less Than 130 Mg/dl 15)  Onetouch Ultra Test  Strp (Glucose Blood) .... Use To Test Blood Sugars Three Times A Day 16)  Insulin Syringe 31g X 5/16" 0.3 Ml Misc (Insulin Syringe-Needle U-100) .... Use To Inject Lantus Insulin Once Daily 17)  Lisinopril 5 Mg Tabs (Lisinopril) .... Take 1 Tablet By Mouth Once A Day  Allergies (verified): 1)  ! Ultram 2)  ! Morphine  Past History:  Past Medical History: Last updated: 03/02/2007 COPD Depression Diabetes mellitus, type I Osteoporosis Anxiety Low back pain Chronic diffuse pain involving the back, arms, and legs  Social History: Last updated: 12/07/2007 Lives alone.  No EtOH.  No illegal drugs.  Poor relationship with children.    Risk Factors: Alcohol Use: 0 (11/30/2008) Exercise: no (11/30/2008)  Risk Factors: Smoking Status: current (11/30/2008) Packs/Day: 1.5 (11/30/2008)  Family History: Reviewed history and no changes required. No significant family medical history  Social History: Reviewed history from 12/07/2007 and no changes required. Lives alone.  No EtOH.  No illegal drugs.  Poor relationship with children.    Review of  Systems       Per HPI  Physical Exam  General:  alert, well-developed, and well-hydrated.   Lungs:  normal respiratory effort, no intercostal retractions, no accessory muscle use, normal breath sounds, no crackles, and no wheezes.   Heart:  normal rate, regular rhythm, no murmur, no gallop, no rub, and no JVD.   Genitalia:  no external lesions, no vaginal discharge, mucosa pink and moist, no vaginal or cervical lesions, no vaginal atrophy, no friaility or hemorrhage, and no adnexal masses or tenderness.    Diabetes Management Exam:    Foot Exam (with socks and/or shoes not present):       Sensory-Pinprick/Light touch:          Left medial foot (L-4): normal          Left dorsal foot (L-5): normal          Left lateral foot (S-1): normal          Right medial foot (L-4): normal          Right dorsal foot (L-5): normal          Right lateral foot (S-1): normal       Sensory-Monofilament:          Left foot: normal          Right foot: normal       Inspection:          Left foot: normal          Right foot: normal       Nails:          Left foot: thickened          Right foot: thickened    Impression & Recommendations:  Problem # 1:  DENTAL PAIN (ICD-525.9) Resolved, pt has completed antibiotic therapy and will follow up with Dr. Owens Shark on 12/14/2008. We will also follow up on the recommendations. Pt has no new dental concerns.   Problem # 2:  HYPERTENSION (ICD-401.9) Good control and therefore will continue the same regimen. Pt was advised to continue to monitor her BP regularly and to call us back of she notes any higher episodes.   Her updated medication list for this problem includes:    Hydrochlorothiazide 25 Mg Tabs (Hydrochlorothiazide) .Marland KitchenMarland KitchenMarland KitchenMarland Kitchen  Take 1 tablet by mouth once a day    Lisinopril 5 Mg Tabs (Lisinopril) .Marland Kitchen... Take 1 tablet by mouth once a day  BP today: 116/70 Prior BP: 151/84 (11/15/2008)  Labs Reviewed: K+: 4.0 (06/15/2008) Creat: : 0.77 (06/15/2008)    Chol: 108 (11/15/2008)   HDL: 38 (11/15/2008)   LDL: 46 (11/15/2008)   TG: 122 (11/15/2008)  Problem # 3:  Preventive Health Care (ICD-V70.0) Will do PAP Smear and will follow up on the results.   Problem # 4:  DYSLIPIDEMIA (ICD-272.4) Cholesterol and LDL both trending down and that is encouraging. Will repeat FLP on pt's next visit to ensure that the trend remains stable.  Issue of hypertriglyceridemia appears resolved.  Her updated medication list for this problem includes:    Crestor 20 Mg Tabs (Rosuvastatin calcium) .Marland Kitchen... Take 1 tablet by mouth once a day  Labs Reviewed: SGOT: 23 (04/18/2008)   SGPT: 26 (04/18/2008)   HDL:38 (11/15/2008), 32 (04/18/2008)  LDL:46 (11/15/2008), 51 (04/18/2008)  Chol:108 (11/15/2008), 127 (04/18/2008)  Trig:122 (11/15/2008), 222 (04/18/2008)  Problem # 5:  BUNION (ICD-727.1) Pt has follow appointment with Dr. Lilia Argue at [facility] and will follow up with him in next few weeks.   Complete Medication List: 1)  Prilosec Otc 20 Mg Tbec (Omeprazole magnesium) .... Take two tablets by mouth daily 2)  Alprazolam 0.5 Mg Tabs (Alprazolam) .... One pill daily as needed for anxiety 3)  Albuterol 90 Mcg/act Aers (Albuterol) .... Two puffs every four hours as needed for shortness of breath 4)  Hydrochlorothiazide 25 Mg Tabs (Hydrochlorothiazide) .... Take 1 tablet by mouth once a day 5)  Spiriva Handihaler 18 Mcg Caps (Tiotropium bromide monohydrate) .... Inhale contents of 1 capsule once a day 6)  Crestor 20 Mg Tabs (Rosuvastatin calcium) .... Take 1 tablet by mouth once a day 7)  Advair Diskus 250-50 Mcg/dose Misc (Fluticasone-salmeterol) .Marland Kitchen.. 1 puff twice daily 8)  Glucophage Xr 500 Mg Xr24h-tab (Metformin hcl) .... 2 pills twice a day 9)  Cymbalta 30 Mg Cpep (Duloxetine hcl) .... Take 3  tables by mouth once daily 10)  Aspir-low 81 Mg Tbec (Aspirin) .... Take 1 tablet by mouth once a day 11)  Glipizide Xl 10 Mg Tb24 (Glipizide) .... Take 1 tablet by  mouth twice a day 12)  Hydrocodone-ibuprofen 7.5-200 Mg Tabs (Hydrocodone-ibuprofen) .... Take one tab by mouth once every 6 hours as needed for pain 13)  Ferrous Sulfate 325 (65 Fe) Mg Tbec (Ferrous sulfate) .... Take 1 tablet by mouth three times a day 14)  Lantus 100 Unit/ml Soln (Insulin glargine) .... Inject 30 units subcutaneously daily and increase until fasting blood sugars are mostly less than 130 mg/dl 15)  Onetouch Ultra Test Strp (Glucose blood) .... Use to test blood sugars three times a day 16)  Insulin Syringe 31g X 5/16" 0.3 Ml Misc (Insulin syringe-needle u-100) .... Use to inject lantus insulin once daily 17)  Lisinopril 5 Mg Tabs (Lisinopril) .... Take 1 tablet by mouth once a day  Other Orders: T-Pap Smear, Thin Prep ([id number])  Patient Instructions: 1)  Please schedule a follow-up appointment in 2 months. 2)  Check your blood sugars regularly. If your readings are usually above 350 : or below 70 you should contact our office. 3)  Check your feet each night for sore areas, calluses or signs of infection. 4)  Check your Blood Pressure regularly. If it is above 170: you should make an appointment.  Prescriptions: ASPIR-LOW 81 MG  TBEC (ASPIRIN) Take 1 tablet by mouth once a day  #30 x 3   Entered and Authorized by:   Trinidad Curet MD   Signed by:   Trinidad Curet MD on 11/30/2008   Method used:   Handwritten   RxID:   [id number] HYDROCODONE-IBUPROFEN 7.5-200 MG TABS (HYDROCODONE-IBUPROFEN) Take one tab by mouth once every 6 hours as needed for pain  #120 x 2   Entered and Authorized by:   Trinidad Curet MD   Signed by:   Trinidad Curet MD on 11/30/2008   Method used:   Handwritten   RxID:   [id number] ALBUTEROL 90 MCG/ACT AERS (ALBUTEROL) Two puffs every four hours as needed for shortness of breath  #1 x 5   Entered and Authorized by:   Trinidad Curet MD   Signed by:   Trinidad Curet MD on 11/30/2008   Method used:   Handwritten   RxID:   [id number]  Process  Orders Check Orders Results:     [facility]: Check successful Tests Sent for requisitioning (November 30, 2008 10:54 PM):     11/30/2008: [facility] -- T-Pap Smear, Thin Prep [[id number]] (signed)     Immunization History:  Influenza Immunization History:    Influenza:  historical (10/23/2008)

## 2010-03-12 NOTE — Miscellaneous (Signed)
Summary: Orders Update  Clinical Lists Changes  Orders: Added new Test order of T-Basic Metabolic Panel ([id number]) - Signed

## 2010-03-12 NOTE — Assessment & Plan Note (Signed)
Summary: FU OV/EST/VS   Vital Signs:  Patient Profile:   60 Years Old Female Height:     64 inches (162.56 cm) Weight:      182.2 pounds (82.82 kg) BMI:     31.39 Temp:     97.2 degrees F (36.22 degrees C) oral Pulse rate:   91 / minute BP sitting:   113 / 76  (right arm)  Pt. in pain?   yes    Location:   arms,back of head, legs    Intensity:   5    Type:       aching  Vitals Entered BySilverio Decamp NT II (August 24, 2007 8:54 AM)              Is Patient Diabetic? Yes  Nutritional Status BMI of 25 - 29 = overweight  Does patient need assistance? Functional Status Self care Ambulation Normal     PCP:  Lucy Chris MD   History of Present Illness: [name] is a 60 yo woman with diagnoses that include fibromyalgia, hypertension, and DM.  She has begun PT but has missed a few appointments.  Pain has been persistent and severe. She has gotten information on topiramate wants to start taking it.    She has been taking all of her medications as prescribed without noticed side effects.  NO episodes of hypoglycemia.    In regards to her COPD, she is using Albuterol inhaler infrequently and appears well-controlled.     Prior Medications Reviewed Using: Patient Recall  Updated Prior Medication List: PRILOSEC OTC 20 MG  TBEC (OMEPRAZOLE MAGNESIUM) Take two tablets by mouth daily ALPRAZOLAM 0.5 MG  TABS (ALPRAZOLAM) one pill daily as needed for anxiety ALBUTEROL 90 MCG/ACT AERS (ALBUTEROL) Two puffs every four hours as needed for shortness of breath HYDROCHLOROTHIAZIDE 25 MG TABS (HYDROCHLOROTHIAZIDE) Take 1 tablet by mouth once a day SPIRIVA HANDIHALER 18 MCG CAPS (TIOTROPIUM BROMIDE MONOHYDRATE) Inhale contents of 1 capsule once a day CRESTOR 20 MG  TABS (ROSUVASTATIN CALCIUM) Take 1 tablet by mouth once a day ADVAIR DISKUS 250-50 MCG/DOSE MISC (FLUTICASONE-SALMETEROL) 1 puff twice daily GLUCOPHAGE XR 500 MG TB24 (METFORMIN HCL) 2 pills in AM, 2 pills in PM CYMBALTA 30  MG CPEP (DULOXETINE HCL) Take 3  tables by mouth once daily ASPIR-LOW 81 MG TBEC (ASPIRIN)  GLIPIZIDE XL 10 MG  TB24 (GLIPIZIDE) Take 2 tablet by mouth once a day VICODIN 5-500 MG  TABS (HYDROCODONE-ACETAMINOPHEN) Take one tablet by mouth every eight hours as needed for pain LORATADINE 10 MG  TABS (LORATADINE) Take 1 tablet by mouth once a day NYSTATIN 100000 UNIT/GM  CREA (NYSTATIN) Apply to affected area two times a day. FERROUS SULFATE 325 (65 FE) MG  TBEC (FERROUS SULFATE) Take 1 tablet by mouth three times a day TOPAMAX 25 MG  TABS (TOPIRAMATE) Take 1 tablet by mouth twice a day  Current Allergies: ! ULTRAM ! MORPHINE   Social History:    Lives alone.  No EtOH.  No illegal drugs.    Risk Factors: Tobacco use:  current    Year started:  AT THE AGE OF 12    Cigarettes:  Yes -- 1.5 pack(s) per day Alcohol use:  no Exercise:  no Seatbelt use:  100 %  Mammogram History:    Date of Last Mammogram:  08/03/2007  PAP Smear History:    Date of Last PAP Smear:  06/29/2007  Colonoscopy History:     Date of Last Colonoscopy:  08/18/2005   Colonoscopy History:     Date of Last Colonoscopy:  08/18/2005    Results:  normal   Colonoscopy History:     Date of Last Colonoscopy:  08/18/2005   Colonoscopy History:     Date of Last Colonoscopy:  08/18/2005    Results:  normal    Review of Systems  General      Complains of sweats.      Denies chills and fever.  CV      Denies chest pain or discomfort, lightheadness, palpitations, shortness of breath with exertion, and swelling of feet.  GI      Denies abdominal pain, bloody stools, constipation, dark tarry stools, diarrhea, nausea, and vomiting.  MS      Complains of muscle aches.   Physical Exam  General:     alert and well-developed.   Lungs:     normal respiratory effort and normal breath sounds.   Heart:     normal rate, regular rhythm, and no murmur.   Abdomen:     soft, non-tender, and normal bowel sounds.    Extremities:     No edema    Impression & Recommendations:  Problem # 1:  AODM (ICD-250.00) HgbA1C, urine microalbumin, LDL all look good.  Her updated medication list for this problem includes:    Glucophage Xr 500 Mg Tb24 (Metformin hcl) .Marland Kitchen... 2 pills in am, 2 pills in pm    Aspir-low 81 Mg Tbec (Aspirin)    Glipizide Xl 10 Mg Tb24 (Glipizide) .Marland Kitchen... Take 2 tablet by mouth once a day  Orders: T-Comprehensive Metabolic Panel ([id number])   Problem # 2:  FIBROMYALGIA (ICD-729.1) Assessment: Improved Will start Topamax as a last resort.  She is on maximum dose of Cymbalta and did not tolerate Lyrica.  Her updated medication list for this problem includes:    Aspir-low 81 Mg Tbec (Aspirin)    Vicodin 5-500 Mg Tabs (Hydrocodone-acetaminophen) .Marland Kitchen... Take one tablet by mouth every eight hours as needed for pain   Problem # 3:  ANEMIA (ICD-285.9) Will recheck CBC today.    Her updated medication list for this problem includes:    Ferrous Sulfate 325 (65 Fe) Mg Tbec (Ferrous sulfate) .Marland Kitchen... Take 1 tablet by mouth three times a day  Orders: T-CBC No Diff ([id number])   Problem # 4:  FACIAL FLUSHING (ICD-782.62) Patient is to recomplete 24 hour urine for possible carcinoid syndrome.  Problem # 5:  TOBACCO ABUSE (ICD-305.1) Ms. Totino used nicotine patches and was able to quit for 9 days but has resumed tobacco use.  Encouraged her efforts.  She is going to try again.    Problem # 6:  Preventive Health Care (ICD-V70.0) Updated Prevention form.   Complete Medication List: 1)  Prilosec Otc 20 Mg Tbec (Omeprazole magnesium) .... Take two tablets by mouth daily 2)  Alprazolam 0.5 Mg Tabs (Alprazolam) .... One pill daily as needed for anxiety 3)  Albuterol 90 Mcg/act Aers (Albuterol) .... Two puffs every four hours as needed for shortness of breath 4)  Hydrochlorothiazide 25 Mg Tabs (Hydrochlorothiazide) .... Take 1 tablet by mouth once a day 5)  Spiriva Handihaler 18 Mcg  Caps (Tiotropium bromide monohydrate) .... Inhale contents of 1 capsule once a day 6)  Crestor 20 Mg Tabs (Rosuvastatin calcium) .... Take 1 tablet by mouth once a day 7)  Advair Diskus 250-50 Mcg/dose Misc (Fluticasone-salmeterol) .Marland Kitchen.. 1 puff twice daily 8)  Glucophage Xr 500 Mg Tb24 (Metformin hcl) .... 2  pills in am, 2 pills in pm 9)  Cymbalta 30 Mg Cpep (Duloxetine hcl) .... Take 3  tables by mouth once daily 10)  Aspir-low 81 Mg Tbec (Aspirin) 11)  Glipizide Xl 10 Mg Tb24 (Glipizide) .... Take 2 tablet by mouth once a day 12)  Vicodin 5-500 Mg Tabs (Hydrocodone-acetaminophen) .... Take one tablet by mouth every eight hours as needed for pain 13)  Loratadine 10 Mg Tabs (Loratadine) .... Take 1 tablet by mouth once a day 14)  Nystatin 100000 Unit/gm Crea (Nystatin) .... Apply to affected area two times a day. 15)  Ferrous Sulfate 325 (65 Fe) Mg Tbec (Ferrous sulfate) .... Take 1 tablet by mouth three times a day 16)  Topamax 25 Mg Tabs (Topiramate) .... Take 1 tablet by mouth twice a day   Patient Instructions: 1)  Please schedule a follow-up appointment in 1 month.   ]  Vital Signs:  Patient Profile:   60 Years Old Female Height:     64 inches (162.56 cm) Weight:      182.2 pounds (82.82 kg) BMI:     31.39 Temp:     97.2 degrees F (36.22 degrees C) oral Pulse rate:   91 / minute BP sitting:   113 / 76    Location:   arms,back of head, legs    Intensity:   5    Type:       aching             Last PAP Result  Specimen Adequacy: Unsatisfactory for evaluation.  Interpretation/Result:Negative for intraepithelial Lesion or Malignancy.    PapHx   Specimen Adequacy: Unsatisfactory for evaluation.  Interpretation/Result:Negative for intraepithelial Lesion or Malignancy.    (06/29/2007 9:52:43 AM)     Colonoscopy Result Date:  08/18/2005 Colonoscopy Result:  normal

## 2010-03-12 NOTE — Progress Notes (Signed)
Summary: shoes/ hla  Phone Note Call from Patient   Summary of Call: pt calls and states she needs script for her diab shoes w/ arch inserts, was supposed to get this at last visit and didn't, please write and i will fax to [employer] Initial call taken by: Freddy Finner RN,  July 13, 2009 11:30 AM  Follow-up for Phone Call        No prob, I will do it this AM thanks iskra Follow-up by: Trinidad Curet MD,  July 16, 2009 7:13 AM

## 2010-03-12 NOTE — Progress Notes (Signed)
Summary: Dental referral-[facility]  Phone Note Outgoing Call   Call placed by: Debra Ditzler RN,  September 29, 2006 3:07 PM Call placed to: Patient Summary of Call: Pt needs dental referral. Info faxed to [facility]. Pt aware that they will call her with appt. ..................................................................Marland KitchenDebra Ditzler RN  September 29, 2006 3:08 PM          Appended Document: Dental referral-[facility] faxed papers to dental clinic. Hilda Blades Ditzler RN  01/05/07  11:30AM

## 2010-03-12 NOTE — Progress Notes (Signed)
Summary: RX and lab results//kg  Phone Note Other Incoming Call back at [facility] ([place] Phone [phone]   Call placed by: Nadine Counts [name]),  Jun 30, 2007 11:51 AM Call placed to: Patient Summary of Call: Returned call from pt, she spoke with Ronny Bacon (pt assistance) about getting Topamax.  Pt has medicare and cant get this med through the pt assistance program.  Pt will have [place] send rx to [facility] ([address]  If insurance denies med, Ronny Bacon  may be able to get med approved with a  prior authorization.  Will forward message PCP.  Pt also given negative results of wet prep and GC probe.  Initial call taken by: Nadine Counts [name]),  Jun 30, 2007 11:57 AM  Follow-up for Phone Call        Topamax prescription sent.  Follow-up by: Lucy Chris MD,  Jun 30, 2007 12:05 PM    New/Updated Medications: TOPAMAX 25 MG  TABS (TOPIRAMATE) Take 1 tablet by mouth twice a day   Prescriptions: TOPAMAX 25 MG  TABS (TOPIRAMATE) Take 1 tablet by mouth twice a day  #62 x 1   Entered and Authorized by:   Lucy Chris MD   Signed by:   Lucy Chris MD on 06/30/2007   Method used:   Electronically sent to ...       [place], [place]  [id number]       Ph: [phone]       Fax: [phone]   RxID:   [id number]

## 2010-03-12 NOTE — Assessment & Plan Note (Signed)
Summary: Soc. Work  Met with [name] in exam room yesterday and she was distressed asking for my help in getting financial assistance for the Medicare copay on her CPAP which she cannot afford on a $800 plus equipment.  I told [name] I would obtain fin info from [facility] and write letter on her behalf to [employer] so they can help her with the copay.  The plan is to gather all info, call [employer] and fax info.

## 2010-03-12 NOTE — Assessment & Plan Note (Signed)
Summary: Social Work  Met with [name] in exam room re: her SCAT application.  She wants to put the SCAT on hold for now because she likes the other resource that was given to her Nurse, adult).  She was very complimentary about the [employer] and was going to use it for her appointment today but opted to drive her car instead because the weather was nice. (Apparently her vehicle doesn't have heat).  [name] would really benefit from participating in counseling.  I recently sent her a list of mental health counselors in the area who will accept Medicare.  This is the second time I have given her the information.  I am hopeful that she will make an appointment this time.   The other thing that occurred to me sitting here in my office is that [place] might benefit from participating is some of the other [employer] activities like their lunch program or their volunteer program.  I will suggest that to her since she had such a good experience with [employer].

## 2010-03-12 NOTE — Assessment & Plan Note (Signed)
Summary: f/u [mkj]   Vital Signs:  Patient Profile:   60 Years Old Female Height:     64 inches (162.56 cm) Weight:      181.2 pounds (82.36 kg) BMI:     31.22 Temp:     97.0 degrees F (36.11 degrees C) oral Pulse rate:   83 / minute BP sitting:   120 / 79  (right arm)  Pt. in pain?   no  Vitals Entered By: Nadine Counts [name]) (Jun 29, 2007 12:02 PM)              Is Patient Diabetic? Yes  Nutritional Status BMI of > 30 = obese CBG Result 143  Does patient need assistance? Functional Status Self care Ambulation Normal     PCP:  Lucy Chris MD  Chief Complaint:  c/o vaginal itching and burning.  History of Present Illness: [name] is a 60 yo woman with PMH as noted who presents today for routine follow-up.  She brings a couple of complaints with her as usual. 1.  Vaginal itching with thin white discharge.  No sexual activity in four years.  LMP was 9 years ago.  No vaginal bleeding, no other discharge, no pelvic pain, no fevers/ chills.   2.  Bump on her left arm which has been present for about two weeks and intensely pruritic.  3.  Hypoglycemia with two readings of 68 4.  Fibromyalgia with diffuse poorly controlled pain. 5.  Headaches that are occuring daily.  Pain is intermittent, non-throbbing, no nausea, no photophobia. Headaches in varying locations and without change with positional changes.   Using Vicodn two to three times a week.  She is not using CPAP machine.       Current Allergies: ! ULTRAM ! MORPHINE    Risk Factors:  Tobacco use:  current    Year started:  AT THE AGE OF 12    Cigarettes:  Yes -- 1.5 pack(s) per day    Counseled to quit/cut down tobacco use:  yes Alcohol use:  no Exercise:  no Seatbelt use:  100 %   Review of Systems  General      Complains of fatigue.      Denies chills, fever, and weakness.  CV      Denies chest pain or discomfort, difficulty breathing at night, difficulty breathing while lying down,  shortness of breath with exertion, and swelling of feet.  GI      Denies bloody stools, change in bowel habits, and dark tarry stools.   Physical Exam  General:     alert and well-developed.   Lungs:     normal respiratory effort and normal breath sounds.   Heart:     normal rate and regular rhythm.   Abdomen:     soft, non-tender, and normal bowel sounds.   Genitalia:     no vaginal discharge, no adnexal masses or tenderness, and vaginal atrophy.   Extremities:     No edema    Impression & Recommendations:  Problem # 1:  VAGINAL PRURITUS (ICD-698.1) Suspect this is secondary to vaginal atrophy.  Wet prep pending for possible yeast infection.   Orders: T-Pap Smear, Thin Prep (Z6109) T- GC Chlamydia ([id number]) T-Wet Prep (in-house) ([id number])   Problem # 2:  AODM (ICD-250.00) Excellent control.  Her medication dosing and use of extended release formulations make no sense, but pt refuses to change.    Her updated medication list for this problem includes:  Glucophage Xr 500 Mg Tb24 (Metformin hcl) .Marland Kitchen... 2 pills in am, 2 pills in pm    Aspir-low 81 Mg Tbec (Aspirin)    Glipizide Xl 10 Mg Tb24 (Glipizide) .Marland Kitchen... Take 2 tablet by mouth once a day  Orders: T-Hgb A1C (in-house) ([id number]) T- Capillary Blood Glucose ([id number])   Problem # 3:  FIBROMYALGIA (ICD-729.1) Cont current regimen for now.  Pt has not tolerated TCA's in past.   Her updated medication list for this problem includes:    Aspir-low 81 Mg Tbec (Aspirin)    Vicodin 5-500 Mg Tabs (Hydrocodone-acetaminophen) .Marland Kitchen... Take one tablet by mouth every eight hours as needed for pain   Problem # 4:  Headache Question migraine vs tension headaches.  Daily occurence is worrisome, but normal neuro exam and lack of other alarming symptoms is reassuring.  Will try Topamax for prophylaxis with hopes that it will help with weight loss as well.    Complete Medication List: 1)  Prilosec Otc 20 Mg Tbec (Omeprazole magnesium)  .... Take two tablets by mouth daily 2)  Alprazolam 0.5 Mg Tabs (Alprazolam) .... One pill daily as needed for anxiety 3)  Albuterol 90 Mcg/act Aers (Albuterol) .... Two puffs every four hours as needed for shortness of breath 4)  Hydrochlorothiazide 25 Mg Tabs (Hydrochlorothiazide) .... Take 1 tablet by mouth once a day 5)  Spiriva Handihaler 18 Mcg Caps (Tiotropium bromide monohydrate) .... Inhale contents of 1 capsule once a day 6)  Crestor 20 Mg Tabs (Rosuvastatin calcium) .... Take 1 tablet by mouth once a day 7)  Advair Diskus 250-50 Mcg/dose Misc (Fluticasone-salmeterol) .Marland Kitchen.. 1 puff twice daily 8)  Glucophage Xr 500 Mg Tb24 (Metformin hcl) .... 2 pills in am, 2 pills in pm 9)  Cymbalta 30 Mg Cpep (Duloxetine hcl) .... Take 3  tables by mouth once daily 10)  Aspir-low 81 Mg Tbec (Aspirin) 11)  Glipizide Xl 10 Mg Tb24 (Glipizide) .... Take 2 tablet by mouth once a day 12)  Vicodin 5-500 Mg Tabs (Hydrocodone-acetaminophen) .... Take one tablet by mouth every eight hours as needed for pain 13)  Loratadine 10 Mg Tabs (Loratadine) .... Take 1 tablet by mouth once a day 14)  Nystatin 100000 Unit/gm Crea (Nystatin) .... Apply to affected area two times a day. 15)  Ferrous Sulfate 325 (65 Fe) Mg Tbec (Ferrous sulfate) .... Take 1 tablet by mouth three times a day   Patient Instructions: 1)  Call Antony Blackbird at [phone] about applying for Topamax  2)  Please schedule a follow-up appointment in 2-3 months.   ] Laboratory Results   Blood Tests   Date/Time Received: Jun 29, 2007 12:09 PM Date/Time Reported: Maryan Rued  Jun 29, 2007 12:09 PM  HGBA1C: 6.2%   (Normal Range: Non-Diabetic - 3-6%   Control Diabetic - 6-8%) CBG Random:: 131m/dL

## 2010-03-12 NOTE — Miscellaneous (Signed)
Summary: Hospital Admission  INTERNAL MEDICINE ADMISSION HISTORY AND PHYSICAL  PCP: Dr. Donnella Bi  CC: Weakness, Shortness of breath  HPI: 60 yr old woman with pmhx COPD, DM type1 comes to the clinic complaining weakness and shortness of breath that started on January of this year but has gotten progressively worse in the last month and 1/2. Weakness is described as being generalized. Shortness of breath is mostly on exertion. Patient reports that even walking from one side of the room to the other makes her symptomatic. Associated with coughing for the last 2 weeks a productive but clear phlegm. Cough is mostly in the morning. Has clear nasal secretions. Epigastric abdominal pain since the beginning of the year, nausea, frequent burping, and flatulence. Had 3-4 episodes of diarrhea on friday, no blood.  Denies hematuria, melena, hematochezia, hemoptysis, fever/chills, chest pain, palpitations, diphoresis, NSAID abuse.   ALLERGIES: ! ULTRAM ! MORPHINE  PAST MEDICAL HISTORY: COPD Depression Diabetes mellitus, type I Osteoporosis Anxiety Low back pain Chronic diffuse pain involving the back, arms, and legs   MEDICATIONS: PRILOSEC 40 MG CPDR (OMEPRAZOLE) Take 1 tablet by mouth once a day ALPRAZOLAM 0.5 MG  TABS (ALPRAZOLAM) one pill daily as needed for anxiety ALBUTEROL 90 MCG/ACT AERS (ALBUTEROL) Two puffs every four hours as needed for shortness of breath SPIRIVA HANDIHALER 18 MCG CAPS (TIOTROPIUM BROMIDE MONOHYDRATE) Inhale contents of 1 capsule once a day CRESTOR 20 MG  TABS (ROSUVASTATIN CALCIUM) Take 1 tablet by mouth once a day ADVAIR DISKUS 250-50 MCG/DOSE MISC (FLUTICASONE-SALMETEROL) 1 puff twice daily GLUCOPHAGE XR 500 MG XR24H-TAB (METFORMIN HCL) 2 pills twice a day CYMBALTA 30 MG CPEP (DULOXETINE HCL) Take 3  tables by mouth once daily ASPIR-LOW 81 MG TBEC (ASPIRIN) Take 1 tablet by mouth once a day GLIPIZIDE XL 10 MG  TB24 (GLIPIZIDE) Take 1 tablet by mouth twice a  day HYDROCODONE-ACETAMINOPHEN 7.5-325 MG TABS (HYDROCODONE-ACETAMINOPHEN) Take 1 tablet every 4-6 hours by mouth as needed for the pain. LANTUS 100 UNIT/ML SOLN (INSULIN GLARGINE) Inject 34 units subcutaneously daily and increase until fasting blood sugars are mostly less than 130 mg/dl     ONETOUCH ULTRA TEST  STRP (GLUCOSE BLOOD) Use to test blood sugars three times a day     INSULIN SYRINGE 31G X 5/16" 0.3 ML MISC (INSULIN SYRINGE-NEEDLE U-100) use to inject lantus insulin once daily LISINOPRIL 5 MG TABS (LISINOPRIL) Take 1 tablet by mouth once a day NYSTATIN 100000 UNIT/GM CREA (NYSTATIN) apply to the affected area 3-4 times daily as needed LORATADINE 10 MG TABS (LORATADINE) Take 1 tablet by mouth once a day WELLBUTRIN XL 150 MG XR24H-TAB (BUPROPION HCL) take 1 tablet once daily PROMETHAZINE HCL 12.5 MG TABS (PROMETHAZINE HCL) take 1 pill by mouth three times a day as needed for nausea and vomiting. FERROUS SULFATE 325 (65 FE) MG TABS (FERROUS SULFATE) Take 1 tablet by mouth two times a day for low blood iron   SOCIAL HISTORY: Lives alone.  No EtOH.  No illegal drugs.  Poor relationship with children.    FAMILY HISTORY No significant family medical history  ROS: Per HPI  VITALS: T: 99.1 P:96  BP:114/62  R:24  O2SAT:95  ON:ra  PHYSICAL EXAM: Gen: Patient is in NAD, Pleasant. Eyes: PERRL, EOMI, No signs of anemia or jaundince. ENT: MMM, OP erythema, no exudates or thrush Neck: Supple, No carotid Bruits, No JVD, No thyromegaly Resp: CTA- Bilaterally, No W/C/R. CVS: S1S2 RRR, No M/R/G GI: Abdomen is soft. Tenderness to palpation epigastric and RUQ  pain, ND, NG, NR, BS+. No organomegaly.       Rectal vault is empty, normal stool, no visible blood, Guaic negative. Ext: No pedal edema, cyanosis or clubbing. GU: No CVA tenderness. Skin: No visible rashes, scars. Lymph: No palpable lymphadenopathy. MS: Moving all 4 extremities. Neuro: A&O X3, CN II - XII are grossly intact. Motor  strength is 5/5 in the all 4 extremities, Sensations intact to light touch, Gait normal, Cerebellar signs negative. Psych: Appropriate   LABS:  WBC COUNT            [H]  10.8 K/uL                   4.0-10.5 RBC COUNT            [L]  3.16 MIL/uL                 3.87-5.11 HEMOGLOBIN           [L]  7.9 g/dL                    12.0-15.0 HEMATOCRIT           [L]  24.5 %                      36.0-46.0 MCV                  [L]  77.4 fL                     78.0-100.0 MCH                  [L]  25.1 pg                     26.0-34.0 MCHC                      32.5 g/dL                   30.0-36.0 RDW                  [H]  18.4 %                      11.5-15.5 PLATELET COUNT            242 K/uL                    150-400 NEUTROPHIL                71 %                        43-77 ABS GRANULOCYTE           7.7 K/uL                    1.7-7.7 LYMPHOCYTE                18 %                        12-46 ABS LYMPH                 2.0 K/uL                    0.7-4.0 MONOCYTE  8 %                         3-12 ABS MONOCYTE              0.9 K/uL                    0.1-1.0 EOSINOPHIL                1 %                         0-5 ABS EOS                   0.1 K/uL                    0.0-0.7 BASOPHIL                  1 %                         0-1 ABS BASO                  0.1 K/uL                    0.0-0.1  SODIUM                    142 mEq/L                   135-145 POTASSIUM                 4.0 mEq/L                   3.5-5.1 CHLORIDE                  108 mEq/L                   96-112 CARBON DIOXIDE            25 mEq/L                    19-32 GLUCOSE              [H]  109 mg/dL                   70-99 BUN                       9 mg/dL                     6-23 CREATININE                0.77 mg/dL                  0.4-1.2 CALCIUM                   9.5 mg/dL                   8.4-10.5 TOTAL PROTEIN             6.5 g/dL                    6.0-8.3 ALBUMIN  3.6 g/dL                     3.5-5.2 AST/SGOT                  14 U/L                      0-37 ALT/SGPT                  12 U/L                      0-35 ALKALINE PHOSPHATASE      41 U/L                      39-117 BILIRUBIN, TOTAL          0.5 mg/dL                   0.3-1.2 GFR, Est Non Af Am        >60 mL/min                  >60 GFR, Est Afr Am           >60 mL/min                  >60  LACTIC DEHYDROGENASE      152 U/L                     94-250  LIPASE                    20 U/L                      11-59  Labs: 7/20 WBC COUNT                 7.8 K/uL                    4.0-10.5 RBC COUNT            [L]  3.23 MIL/uL                 3.87-5.11 HEMOGLOBIN           [L]  7.9 g/dL                    12.0-15.0 HEMATOCRIT           [L]  25.0 %                      36.0-46.0 MCV                  [L]  77.5 fL                     78.0-100.0 MCH                  [L]  24.6 pg                     26.0-34.0 MCHC                      31.8 g/dL                   30.0-36.0 RDW                  [  H]  18.5 %                      11.5-15.5 PLATELET COUNT            235 K/uL                    150-400 NEUTROPHIL                65 %                        43-77 ABS GRANULOCYTE           5.1 K/uL                    1.7-7.7 LYMPHOCYTE                23 %                        12-46 ABS LYMPH                 1.8 K/uL                    0.7-4.0 MONOCYTE                  10 %                        3-12 ABS MONOCYTE              0.8 K/uL                    0.1-1.0 EOSINOPHIL                1 %                         0-5 ABS EOS                   0.1 K/uL                    0.0-0.7 BASOPHIL                  1 %                         0-1 ABS BASO                  0.0 K/uL                    0.0-0.1  IRON                 [L]  21 ug/dL                    42-135 TIBC                      441 ug/dL                   250-470 % SAT                [L]  5 %  20-55 UIBC                      420  ug/dL FERRITIN                  11 ng/mL RETICULOCYTE COUNT   [H]  3.4 %                       0.4-3.1 RBC                  [L]  3.19 MIL/uL                 3.87-5.11 ABSOLUTE RETIC CT         108.5 K/uL                  19.0-186.0  Chest xray: IMPRESSION: No acute cardiopulmonary disease.  Nonobstructive bowel gas pattern.    ASSESSMENT AND PLAN: (1) Anemia- Baseline Hgb 9.5 Most likely explains weakness and shortness of breath. Current lost may be 2/2 to AVM. Studies also indicate that patient has iron deficiency anemia. No active bleeding at this time. Patient will be admitted to telemetry. Will transfuse 2 units PRBCs as patient symptomatic. Cycle CBC's q 6 hrs and check FOBT. Patient has had a small bowel capsule that was negative, colonoscopy in 2007 and 2004 which showed some polyps that where removed. Pathology was negative for malignancy. Will consider GI consult if patient starts to bleed. Restart iron.   (2) Epigastric Pain- Associated with dyspepsia. Mostly likely 2/2 GERD, PUD. Will start GI cocktail scheduled and protonix.   (3) Cough- Most likely 2/2 postnasal drip. Treat symptomatically.   (4) COPD- Stable. Restart home regimen.  (5) Obstructive Sleep Apnea- Cpap per respiratory.  (6) DM: Restart home meds.  (7) HLD: Restart home meds.  (8)DEPRESSION: Continue home medications  (9)VTE PROPH: SCD's

## 2010-03-12 NOTE — Progress Notes (Signed)
Summary: Refill/gh  Phone Note Refill Request Message from:  [place]cl[place]ito on December 07, 2007 10:33 AM  Refills Requested: Medication #1:  HYDROCHLOROTHIAZIDE 25 MG TABS Take 1 tablet by mouth once a day   Last Refilled: 06/02/2007  Method Requested: Electronic Initial call taken by: Sander Nephew RN,  December 07, 2007 10:33 AM  Follow-up for Phone Call        Refill approved-nurse to complete Follow-up by: Lucy Chris MD,  December 07, 2007 1:35 PM      Prescriptions: HYDROCHLOROTHIAZIDE 25 MG TABS (HYDROCHLOROTHIAZIDE) Take 1 tablet by mouth once a day  #31 x 5   Entered and Authorized by:   Lucy Chris MD   Signed by:   Lucy Chris MD on 12/07/2007   Method used:   Electronically to        [facility]* (retail)       [address]       [place], [place]  [address]       Ph: [phone]       Fax: [phone]   RxID:   [id number]

## 2010-03-12 NOTE — Assessment & Plan Note (Signed)
Summary: h/a, sorethroat, ears hurt, cough x 4 days, rectal bleeding/ hla   Vital Signs:  Patient Profile:   60 Years Old Female Height:     64 inches (162.56 cm) Weight:      182.2 pounds (82.82 kg) Temp:     97.8 degrees F (36.56 degrees C) oral Pulse rate:   96 / minute  Pt. in pain?   yes    Location:   right ear,neck and sore throat    Intensity:   7  Vitals Entered By: Hilda Blades Ditzler RN (May 27, 2006 3:27 PM)              Is Patient Diabetic? Yes  Nutritional Status Normal Nutritional Status Detail good CBG Result 159  Have you ever been in a relationship where you felt threatened, hurt or afraid?denies   Does patient need assistance? Functional Status Self care Ambulation Normal    PCP:  [name] MD  Chief Complaint:  Rectal bleeding again 05/24/06 and c/o of sore throat and neck pain and right ear hurts..  History of Present Illness: 60 year old with a past medical history of HTN, DM2, and sleep apnea presents to the office today complaining of upper respiratory tract symptoms for the past 4 days. She has nasal congestion(mostly clear), sore throat, a dry hacking cough and generally feels achy. She denies and fever, chills, NVD, or decrease appetite. She has tried OTC meds without relief.   General Medical HPI:      Her prior problems were reviewed.    Her current medications were reviewed today.  Prior Medications: PRILOSEC 40 MG CPDR (OMEPRAZOLE) Take 1 capsule by mouth once a day XANAX 0.25 MG TABS (ALPRAZOLAM) Take 1 tablet by mouth once a day p.r.n anxiety ALBUTEROL 90 MCG/ACT AERS (ALBUTEROL) p.r.n wheezing HYDROCHLOROTHIAZIDE 25 MG TABS (HYDROCHLOROTHIAZIDE) Take 1 tablet by mouth once a day SPIRIVA HANDIHALER 18 MCG CAPS (TIOTROPIUM BROMIDE MONOHYDRATE) Inhale contents of 1 capsule once a day LIPITOR 20 MG TABS (ATORVASTATIN CALCIUM) Take 1 tablet by mouth once a day FOSAMAX 70 MG TABS (ALENDRONATE SODIUM) Take one tablet weekly JANUVIA 100 MG  TABS (SITAGLIPTIN PHOSPHATE) Take 1 tablet by mouth once a day ADVAIR DISKUS 250-50 MCG/DOSE MISC (FLUTICASONE-SALMETEROL) 1 puff twice daily GLUCOPHAGE XR 500 MG TB24 (METFORMIN HCL) 2 pills in AM, 2 pills in PM CYMBALTA 30 MG CPEP (DULOXETINE HCL) Take 1 tablet by mouth once a day x 1 wk, then twice daily CHANTIX 1 MG TABS (VARENICLINE TARTRATE) Take 1 tablet by mouth two times a day for 3 months VICODIN 5-500 MG TABS (HYDROCODONE-ACETAMINOPHEN) Take 1 tablet by mouth every six hours as needed for pain CLARITIN 10 MG TABS (LORATADINE) once daily tab ASPIR-LOW 81 MG TBEC (ASPIRIN)  SKELAXIN 800 MG TABS (METAXALONE) one tablet every 8 hours as needed for muscle cramps DICLOFENAC SODIUM 75 MG TBEC (DICLOFENAC SODIUM) two times a day tab GLIPIZIDE 5 MG TABS (GLIPIZIDE) 1/2 pill twice daily with meals GUAIFENEX PSE 60 600-60 MG TB12 (PSEUDOEPHEDRINE-GUAIFENESIN) Take one tab twice a day as needed for congestion/cough. Current Allergies (reviewed today): ! ULTRAM ! MORPHINE    Risk Factors:  Tobacco use:  current    Cigarettes:  Yes -- 1.5 pack(s) per day    Physical Exam  General:     Well-developed,well-nourished,in no acute distress; alert,appropriate and cooperative throughout examination Eyes:     No corneal or conjunctival inflammation noted.  Ears:     No TM bulgingor retraction.There is clear  fluid seen behind the TM but it does not appear to be cloudy. There is a normal light reflex.cholesteatoma.   Nose:     Mildly erythematous mucosa, no thick secreations noted. Mouth:     The pharynx is inflammed with a petichial appearance, but there are no visible exudates. There is no post-nasal drainage. Lungs:     CTAB, no crackles or wheezes. Heart:     RRR normal S1, S2 Abdomen:     soft, non-tender, and normal bowel sounds.      Impression & Recommendations:  Problem # 1:  UPPER RESPIRATORY INFECTION, ACUTE (ICD-465.9) Patient is without fever and does not appear  acutely ill- she has no physical exam findings indicative of infection.  Recommended symptoms control. .  Her updated medication list for this problem includes:    Claritin 10 Mg Tabs (Loratadine) ..... Once daily tab      Guaifenex Pse 60 600-60 Mg Tb12 (Pseudoephedrine-guaifenesin) .Marland Kitchen... Take one tab twice a day as needed for congestion/cough.  The following medications were removed from the medication list:    Duratuss 25-900 Mg Tb12 (Phenylephrine-guaifenesin) .Marland Kitchen... Take one tab two times a day as needed for congestion. do not take for more that 2 weeks.  Her updated medication list for this problem includes:    Claritin 10 Mg Tabs (Loratadine) ..... Once daily tab    Aspir-low 81 Mg Tbec (Aspirin)    Diclofenac Sodium 75 Mg Tbec (Diclofenac sodium) .Marland Kitchen..Marland Kitchen Two times a day tab    Guaifenex Pse 60 600-60 Mg Tb12 (Pseudoephedrine-guaifenesin) .Marland Kitchen... Take one tab twice a day as needed for congestion/cough.   Medications Added to Medication List This Visit: 1)  Duratuss 25-900 Mg Tb12 (Phenylephrine-guaifenesin) .... Take one tab two times a day as needed for congestion. do not take for more that 2 weeks. 2)  Guaifenex Pse 60 600-60 Mg Tb12 (Pseudoephedrine-guaifenesin) .... Take one tab twice a day as needed for congestion/cough.  Other Orders: T- Capillary Blood Glucose (46659) T-Hgb A1C (in-house) (93570VX) T-Rapid Strep (79390)   Patient Instructions: 1)  Upper Respiratory infection symptoms for less than 10 days are not helped by antibiotics.Use warm moist compresses, and decongestants ( only as directed). Call if no improvement in 5-7 days, sooner if increasing pain, fever, or new symptoms. 2)  Please schedule a follow-up appointment as needed.     Vital Signs:  Patient Profile:   60 Years Old Female Height:     64 inches (162.56 cm) Weight:      182.2 pounds (82.82 kg) Temp:     97.8 degrees F (36.56 degrees C) oral Pulse rate:   96 / minute    Location:   right ear,neck  and sore throat    Intensity:   7             CBG Result 159     Laboratory Results   Blood Tests   Date/Time Recieved: May 27, 2006 3:50 PM  Date/Time Reported: May 27, 2006 3:50 PM ..................................................................Marland KitchenMaryan Rued  May 27, 2006 3:51 PM   HGBA1C: 6.1%   (Normal Range: Non-Diabetic - 3-6%   Control Diabetic - 6-8%) CBG Random: 159

## 2010-03-12 NOTE — Progress Notes (Signed)
Summary: labs, admit/ hla  Phone Note Other Incoming   Summary of Call: pt presents for labs c/o shortness of breath, weakness, general malaise. spoke w/ dr Marinda Elk, he evaluated pt and she will be admitted this am. she is in exam rm #[id number] next to lab. will call for rm Initial call taken by: Freddy Finner RN,  September 03, 2009 10:03 AM

## 2010-03-12 NOTE — Assessment & Plan Note (Signed)
Summary: FU ON SHINGLES(KIRK)/DS   Vital Signs:  Patient Profile:   60 Years Old Female Height:     64 inches (162.56 cm) Weight:      179.6 pounds (81.64 kg) BMI:     30.94 Temp:     97.6 degrees F (36.44 degrees C) oral Pulse rate:   96 / minute BP sitting:   116 / 75  (right arm)  Pt. in pain?   yes    Location:   shingles and back    Intensity:   4  Vitals Entered By: Hilda Blades Ditzler RN (February 02, 2008 3:06 PM)              Is Patient Diabetic? Yes Did you bring your meter with you today? No Nutritional Status BMI of > 30 = obese Nutritional Status Detail appetite fair  Have you ever been in a relationship where you felt threatened, hurt or afraid?denies   Does patient need assistance? Functional Status Self care Ambulation Normal Comments Refuses to do CBG.     PCP:  Lucy Chris MD  Chief Complaint:  FU shingles - new pain meds works well and has numbness low back..  History of Present Illness: This is a 60 yo with pmh of multiple medical conditions who is here to follow up on a recent shingles outbreak over he lower back (bilaterally).  She reports that she is feeling much better.  All the vesicles have healed and pain is well controled with the hydrocodone-ibuprofin she is on.  She has no other complaints.    Prior Medication List:  PRILOSEC OTC 20 MG  TBEC (OMEPRAZOLE MAGNESIUM) Take two tablets by mouth daily ALPRAZOLAM 0.5 MG  TABS (ALPRAZOLAM) one pill daily as needed for anxiety ALBUTEROL 90 MCG/ACT AERS (ALBUTEROL) Two puffs every four hours as needed for shortness of breath HYDROCHLOROTHIAZIDE 25 MG TABS (HYDROCHLOROTHIAZIDE) Take 1 tablet by mouth once a day SPIRIVA HANDIHALER 18 MCG CAPS (TIOTROPIUM BROMIDE MONOHYDRATE) Inhale contents of 1 capsule once a day CRESTOR 20 MG  TABS (ROSUVASTATIN CALCIUM) Take 1 tablet by mouth once a day ADVAIR DISKUS 250-50 MCG/DOSE MISC (FLUTICASONE-SALMETEROL) 1 puff twice daily GLUCOPHAGE XR 500 MG TB24  (METFORMIN HCL) 2 pills in AM, 2 pills in PM CYMBALTA 30 MG CPEP (DULOXETINE HCL) Take 3  tables by mouth once daily ASPIR-LOW 81 MG TBEC (ASPIRIN)  GLIPIZIDE XL 10 MG  TB24 (GLIPIZIDE) Take 2 tablet by mouth once a day HYDROCODONE-IBUPROFEN 7.5-200 MG TABS (HYDROCODONE-IBUPROFEN) Take one tab by mouth once every 6 hours as needed for pain LORATADINE 10 MG  TABS (LORATADINE) Take 1 tablet by mouth once a day NYSTATIN 100000 UNIT/GM  CREA (NYSTATIN) Apply to affected area two times a day. FERROUS SULFATE 325 (65 FE) MG  TBEC (FERROUS SULFATE) Take 1 tablet by mouth three times a day TOPAMAX 25 MG  TABS (TOPIRAMATE) Take 1 tablet by mouth twice a day ACYCLOVIR 800 MG TABS (ACYCLOVIR) Take one tab by mouth every 4 hours (five times a day) for 10 days   Current Allergies (reviewed today): ! ULTRAM ! MORPHINE    Risk Factors: Tobacco use:  current    Year started:  AT THE AGE OF 12    Cigarettes:  Yes -- 1.5 pack(s) per day Alcohol use:  no Exercise:  no Seatbelt use:  100 %  Colonoscopy History:    Date of Last Colonoscopy:  08/18/2005  Mammogram History:    Date of Last Mammogram:  08/03/2007  PAP  Smear History:    Date of Last PAP Smear:  06/29/2007   Review of Systems       per hpi   Physical Exam  General:     alert and overweight-appearing.   Skin:     healing dry lesions bilaterally over the lowerback extending to the right flank (under the panus).  no erytema, no purulence.    Impression & Recommendations:  Problem # 1:  SHINGLES (ICD-053.9) Improved with acyclovir and hydrocdone-ibuprofin.  I let her know that this could recur, and now that she knows how it presents she shoudl call immediatly for a refill of acyclovir to stop a flare.  Complete Medication List: 1)  Prilosec Otc 20 Mg Tbec (Omeprazole magnesium) .... Take two tablets by mouth daily 2)  Alprazolam 0.5 Mg Tabs (Alprazolam) .... One pill daily as needed for anxiety 3)  Albuterol 90 Mcg/act  Aers (Albuterol) .... Two puffs every four hours as needed for shortness of breath 4)  Hydrochlorothiazide 25 Mg Tabs (Hydrochlorothiazide) .... Take 1 tablet by mouth once a day 5)  Spiriva Handihaler 18 Mcg Caps (Tiotropium bromide monohydrate) .... Inhale contents of 1 capsule once a day 6)  Crestor 20 Mg Tabs (Rosuvastatin calcium) .... Take 1 tablet by mouth once a day 7)  Advair Diskus 250-50 Mcg/dose Misc (Fluticasone-salmeterol) .Marland Kitchen.. 1 puff twice daily 8)  Glucophage Xr 500 Mg Tb24 (Metformin hcl) .... 2 pills in am, 2 pills in pm 9)  Cymbalta 30 Mg Cpep (Duloxetine hcl) .... Take 3  tables by mouth once daily 10)  Aspir-low 81 Mg Tbec (Aspirin) 11)  Glipizide Xl 10 Mg Tb24 (Glipizide) .... Take 2 tablet by mouth once a day 12)  Hydrocodone-ibuprofen 7.5-200 Mg Tabs (Hydrocodone-ibuprofen) .... Take one tab by mouth once every 6 hours as needed for pain 13)  Loratadine 10 Mg Tabs (Loratadine) .... Take 1 tablet by mouth once a day 14)  Nystatin 100000 Unit/gm Crea (Nystatin) .... Apply to affected area two times a day. 15)  Ferrous Sulfate 325 (65 Fe) Mg Tbec (Ferrous sulfate) .... Take 1 tablet by mouth three times a day 16)  Topamax 25 Mg Tabs (Topiramate) .... Take 1 tablet by mouth twice a day 17)  Acyclovir 800 Mg Tabs (Acyclovir) .... Take one tab by mouth every 4 hours (five times a day) for 10 days   Patient Instructions: 1)  Your shingles are healing well. 2)  Keep your appointment in February with Dr. Selinda Flavin.   ]

## 2010-03-12 NOTE — Letter (Signed)
Summary: ONE TOUCH  ONE TOUCH   Imported By: Garlan Fillers 11/16/2009 09:27:36  _____________________________________________________________________  External Attachment:    Type:   Image     Comment:   External Document

## 2010-03-12 NOTE — Progress Notes (Signed)
Summary: Filed in Error/Disregard note/Pt. already receives disability  Phone Note Other Incoming   Call placed by: Patient Call placed to: Social Work Summary of Call: [name] advises she is sending on disability denial letter so we can write letter of support.  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

## 2010-03-12 NOTE — Progress Notes (Signed)
  Phone Note Call from Patient   Reason for Call: Insurance Question Summary of Call: pt. called and needed Prior Authorization for Prilosec 85m, called insurance company at [phone] authorization complete for Prilosec 445mfor a lifetime. Spoke with PaSaralyn Pilar...................................................................NMarland KitchenAntony BlackbirdMay 14, 2008 9:33 AM

## 2010-03-12 NOTE — Assessment & Plan Note (Signed)
Summary: DR. KIRK TO SEE THIS PATIENT/DS   Vital Signs:  Patient Profile:   60 Years Old Female Height:     64 inches (162.56 cm) Weight:      178.3 pounds (81.05 kg) BMI:     30.72 Temp:     97.7 degrees F (36.50 degrees C) oral Pulse rate:   85 / minute BP sitting:   134 / 73  (right arm)  Vitals Entered By: Silverio Decamp (May 07, 2007 1:36 PM)             Is Patient Diabetic? Yes   Does patient need assistance? Functional Status Self care Ambulation Normal     PCP:  Lucy Chris MD  Chief Complaint:  TALK ABOUT INSULIN.  History of Present Illness: [name] is a 60yo woman with PMH as noted who was initially scheduled for a Pap smear but now refuses that and wants to discuss starting insulin therapy because of an advertisement she saw in a magazine.  She is without acute complaint and denies changes in her bowel movements, hematochezia, melena, SOB, or dizziness.  CBG's have been elevated with her lowest being 110 and her highest around 270.    Current Allergies: ! ULTRAM ! MORPHINE    Risk Factors: Tobacco use:  current    Year started:  AT THE AGE OF 12    Cigarettes:  Yes -- 1.5 pack(s) per day Alcohol use:  no Exercise:  no Seatbelt use:  100 %   Review of Systems  General      Complains of fatigue.      Denies chills and fever.  CV      Denies chest pain or discomfort, lightheadness, palpitations, and shortness of breath with exertion.  GI      Denies abdominal pain, bloody stools, change in bowel habits, and dark tarry stools.   Physical Exam  General:     alert and well-developed.  Obese.  NAD Lungs:     normal respiratory effort and normal breath sounds.   Heart:     normal rate, regular rhythm, and no murmur.   Abdomen:     soft, non-tender, and normal bowel sounds.  Obese Extremities:     No edema    Impression & Recommendations:  Problem # 1:  ANEMIA (ICD-285.9) Iron deficiency in a non-menstruating woman who has had  endoscopy, colonoscopy, and small bowel capsule endoscopy recently.  TTG was also negative.  Will start iron therapy and monitor.     Orders: T- * Misc. Laboratory test [phone]) T-CBC No Diff ([id number])  Her updated medication list for this problem includes:    Ferrous Sulfate 325 (65 Fe) Mg Tbec (Ferrous sulfate) .Marland Kitchen... Take 1 tablet by mouth three times a day   Problem # 2:  AODM (ICD-250.00) After a lengthy discussion with [name], we will maximize SU and monitor glycemic control.    Her updated medication list for this problem includes:    Glucophage Xr 500 Mg Tb24 (Metformin hcl) .Marland Kitchen... 2 pills in am, 2 pills in pm    Aspir-low 81 Mg Tbec (Aspirin)    Glipizide Xl 10 Mg Tb24 (Glipizide) .Marland Kitchen... Take 2 tablet by mouth once a day   Complete Medication List: 1)  Prilosec 40 Mg Cpdr (Omeprazole) .... Take 1 capsule by mouth once a day 2)  Alprazolam 0.5 Mg Tabs (Alprazolam) .... One pill daily as needed for anxiety 3)  Albuterol 90 Mcg/act Aers (Albuterol) .... Two puffs  every four hours as needed for shortness of breath 4)  Hydrochlorothiazide 25 Mg Tabs (Hydrochlorothiazide) .... Take 1 tablet by mouth once a day 5)  Spiriva Handihaler 18 Mcg Caps (Tiotropium bromide monohydrate) .... Inhale contents of 1 capsule once a day 6)  Crestor 20 Mg Tabs (Rosuvastatin calcium) .... Take 1 tablet by mouth once a day 7)  Advair Diskus 250-50 Mcg/dose Misc (Fluticasone-salmeterol) .Marland Kitchen.. 1 puff twice daily 8)  Glucophage Xr 500 Mg Tb24 (Metformin hcl) .... 2 pills in am, 2 pills in pm 9)  Cymbalta 30 Mg Cpep (Duloxetine hcl) .... Take 3  tables by mouth once daily 10)  Aspir-low 81 Mg Tbec (Aspirin) 11)  Glipizide Xl 10 Mg Tb24 (Glipizide) .... Take 2 tablet by mouth once a day 12)  Vicodin 5-500 Mg Tabs (Hydrocodone-acetaminophen) .... Take one tablet by mouth every eight hours as needed for pain 13)  Loratadine 10 Mg Tabs (Loratadine) .... Take 1 tablet by mouth once a day 14)  Nystatin  100000 Unit/gm Crea (Nystatin) .... Apply to affected area two times a day. 15)  Ferrous Sulfate 325 (65 Fe) Mg Tbec (Ferrous sulfate) .... Take 1 tablet by mouth three times a day   Patient Instructions: 1)  Please schedule a follow-up appointment in 1 month.    Prescriptions: FERROUS SULFATE 325 (65 FE) MG  TBEC (FERROUS SULFATE) Take 1 tablet by mouth three times a day  #93 x 2   Entered and Authorized by:   Lucy Chris MD   Signed by:   Lucy Chris MD on 05/10/2007   Method used:   Electronically sent to ...       [place] [place], [place]  [address]       Ph: [phone]       Fax: [phone]   RxID:   [id number] GLIPIZIDE XL 10 MG  TB24 (GLIPIZIDE) Take 2 tablet by mouth once a day  #62 x 2   Entered and Authorized by:   Lucy Chris MD   Signed by:   Lucy Chris MD on 05/10/2007   Method used:   Electronically sent to ...       [place], [place]  [address]       Ph: [phone]       Fax: [phone]   RxID:   [id number]  ] Last LDL:                                                 67 (03/24/2007 8:33:00 PM)

## 2010-03-12 NOTE — Assessment & Plan Note (Signed)
Summary: CHEKUP/ FLU/ SB.   Vital Signs:  Patient Profile:   60 Years Old Female Height:     64 inches (162.56 cm) Weight:      180.3 pounds (81.95 kg) BMI:     31.06 Temp:     97.3 degrees F (36.28 degrees C) oral Pulse rate:   63 / minute BP sitting:   127 / 67  (right arm)  Pt. in pain?   yes    Location:   chest    Intensity:   1  Vitals Entered By: Silverio Decamp NT II (December 07, 2007 3:34 PM)              Is Patient Diabetic? Yes Nutritional Status BMI of 25 - 29 = overweight  Have you ever been in a relationship where you felt threatened, hurt or afraid?No   Does patient need assistance? Functional Status Self care Ambulation Normal     PCP:  Lucy Chris MD  Chief Complaint:  CHEST PAIN SINCE LAST NIGHT.  History of Present Illness: [name] is a 60 yo woman with PMH as noted who presents today for routine follow-up.  Patient complains of epigastric pain which has been constant for about 20 hours and is rated at a 1/10.  Pain was pleuritic one time but not routinely.  She also reports coughing, wheezing, and SOB over last week. All symptoms are worse when she goes outside into the cold.  No fevers, no chills,  or sick contacts.  She has not used her Albuterol inhaler as she thinks it expires this month.    She also reports significant depression because of poor family relations.  No suicidal ideation and no history of suicide attempts. She endorses insomnia, anhedonia, crying spells, and feelings of worthlessness.    Fibromyalgia slightly improved on Topamax and associated headaches have resolved.     Updated Prior Medication List: PRILOSEC OTC 20 MG  TBEC (OMEPRAZOLE MAGNESIUM) Take two tablets by mouth daily ALPRAZOLAM 0.5 MG  TABS (ALPRAZOLAM) one pill daily as needed for anxiety ALBUTEROL 90 MCG/ACT AERS (ALBUTEROL) Two puffs every four hours as needed for shortness of breath HYDROCHLOROTHIAZIDE 25 MG TABS (HYDROCHLOROTHIAZIDE) Take 1 tablet by mouth  once a day SPIRIVA HANDIHALER 18 MCG CAPS (TIOTROPIUM BROMIDE MONOHYDRATE) Inhale contents of 1 capsule once a day CRESTOR 20 MG  TABS (ROSUVASTATIN CALCIUM) Take 1 tablet by mouth once a day ADVAIR DISKUS 250-50 MCG/DOSE MISC (FLUTICASONE-SALMETEROL) 1 puff twice daily GLUCOPHAGE XR 500 MG TB24 (METFORMIN HCL) 2 pills in AM, 2 pills in PM CYMBALTA 30 MG CPEP (DULOXETINE HCL) Take 3  tables by mouth once daily ASPIR-LOW 81 MG TBEC (ASPIRIN)  GLIPIZIDE XL 10 MG  TB24 (GLIPIZIDE) Take 2 tablet by mouth once a day VICODIN 5-500 MG  TABS (HYDROCODONE-ACETAMINOPHEN) Take one tablet by mouth every eight hours as needed for pain LORATADINE 10 MG  TABS (LORATADINE) Take 1 tablet by mouth once a day NYSTATIN 100000 UNIT/GM  CREA (NYSTATIN) Apply to affected area two times a day. FERROUS SULFATE 325 (65 FE) MG  TBEC (FERROUS SULFATE) Take 1 tablet by mouth three times a day TOPAMAX 25 MG  TABS (TOPIRAMATE) Take 1 tablet by mouth twice a day  Current Allergies: ! ULTRAM ! MORPHINE   Social History:    Lives alone.  No EtOH.  No illegal drugs.  Poor relationship with children.      Review of Systems  General      Denies chills and  fever.  CV      Complains of chest pain or discomfort and shortness of breath with exertion.      Denies lightheadness, near fainting, palpitations, and swelling of feet.  GI      Denies abdominal pain, bloody stools, change in bowel habits, and dark tarry stools.   Physical Exam  General:     alert and well-developed.   Lungs:     fair air movement, diffuse mild wheezing, pulse ox 96% with ambulation Heart:     distant, regular rhythm, no murmur Abdomen:     soft, non-tender, and normal bowel sounds.   Extremities:     no edema    Impression & Recommendations:  Problem # 1:  COPD (ICD-496) Acute exacerbation for which we will use a prednisone taper and azithromycin (pt thinks she has had an adverse reaction in the past).  Follow-up as needed.   She will return if symptoms persist or worsen.    Her updated medication list for this problem includes:    Albuterol 90 Mcg/act Aers (Albuterol) .Marland Kitchen..Marland Kitchen Two puffs every four hours as needed for shortness of breath    Spiriva Handihaler 18 Mcg Caps (Tiotropium bromide monohydrate) ..... Inhale contents of 1 capsule once a day    Advair Diskus 250-50 Mcg/dose Misc (Fluticasone-salmeterol) .Marland Kitchen... 1 puff twice daily  Orders: Solumedrol up to 137m (([id number]   Problem # 2:  AODM (ICD-250.00) Previously has had excellent control.  Repeat HgbA1C pending today.   Her updated medication list for this problem includes:    Glucophage Xr 500 Mg Tb24 (Metformin hcl) ..Marland Kitchen.. 2 pills in am, 2 pills in pm    Aspir-low 81 Mg Tbec (Aspirin)    Glipizide Xl 10 Mg Tb24 (Glipizide) ..Marland Kitchen.. Take 2 tablet by mouth once a day  Orders: T-Hgb A1C (in-house) ((77824MP T-Comprehensive Metabolic Panel ([id number]   Problem # 3:  DEPRESSION (ICD-311) Pt refuses counseling or further treatment.  Will continue to encourage counseling.    Her updated medication list for this problem includes:    Alprazolam 0.5 Mg Tabs (Alprazolam) ..... One pill daily as needed for anxiety    Cymbalta 30 Mg Cpep (Duloxetine hcl) ..Marland Kitchen.. Take 3  tables by mouth once daily   Problem # 4:  FIBROMYALGIA (ICD-729.1) Topamax working well.  Headaches have subsided.  CMET pending as Topamax can adversely affect the liver.    Her updated medication list for this problem includes:    Aspir-low 81 Mg Tbec (Aspirin)    Vicodin 5-500 Mg Tabs (Hydrocodone-acetaminophen) ..Marland Kitchen.. Take one tablet by mouth every eight hours as needed for pain   Complete Medication List: 1)  Prilosec Otc 20 Mg Tbec (Omeprazole magnesium) .... Take two tablets by mouth daily 2)  Alprazolam 0.5 Mg Tabs (Alprazolam) .... One pill daily as needed for anxiety 3)  Albuterol 90 Mcg/act Aers (Albuterol) .... Two puffs every four hours as needed for shortness of breath 4)   Hydrochlorothiazide 25 Mg Tabs (Hydrochlorothiazide) .... Take 1 tablet by mouth once a day 5)  Spiriva Handihaler 18 Mcg Caps (Tiotropium bromide monohydrate) .... Inhale contents of 1 capsule once a day 6)  Crestor 20 Mg Tabs (Rosuvastatin calcium) .... Take 1 tablet by mouth once a day 7)  Advair Diskus 250-50 Mcg/dose Misc (Fluticasone-salmeterol) ..Marland Kitchen. 1 puff twice daily 8)  Glucophage Xr 500 Mg Tb24 (Metformin hcl) .... 2 pills in am, 2 pills in pm 9)  Cymbalta 30 Mg Cpep (Duloxetine hcl) .... Take 3  tables by mouth once daily 10)  Aspir-low 81 Mg Tbec (Aspirin) 11)  Glipizide Xl 10 Mg Tb24 (Glipizide) .... Take 2 tablet by mouth once a day 12)  Vicodin 5-500 Mg Tabs (Hydrocodone-acetaminophen) .... Take one tablet by mouth every eight hours as needed for pain 13)  Loratadine 10 Mg Tabs (Loratadine) .... Take 1 tablet by mouth once a day 14)  Nystatin 100000 Unit/gm Crea (Nystatin) .... Apply to affected area two times a day. 15)  Ferrous Sulfate 325 (65 Fe) Mg Tbec (Ferrous sulfate) .... Take 1 tablet by mouth three times a day 16)  Topamax 25 Mg Tabs (Topiramate) .... Take 1 tablet by mouth twice a day 17)  Prednisone (pak) 10 Mg Tabs (Prednisone) .... Take tapering dose of prednisone over 6 days starting with 43m and finishing with 159m18)  Zithromax 1 Gm Pack (Azithromycin) .... Take as directed on package instructions   Patient Instructions: 1)  Please schedule a follow-up appointment in 2 months.   Prescriptions: FERROUS SULFATE 325 (65 FE) MG  TBEC (FERROUS SULFATE) Take 1 tablet by mouth three times a day  #93 x 12   Entered and Authorized by:   KaLucy ChrisD   Signed by:   KaLucy ChrisD on 12/07/2007   Method used:   [facility]        [facility]       [address]     [place]  [id number]     Ph: [phone]     Fax: [phone] RxID:   [id number] 1 GM PACK (AZITHROMYCIN) Take as directed on package  instructions  #1 pack x 0   Entered and Authorized by:   KaLucy ChrisD   Signed by:   KaLucy ChrisD on 12/07/2007   Method used:   [facility]ically to        [phone]([facility]ail)       [address] [place]  [id number]     Ph: [phone]     Fax: [phone] RxID:   [id number] (PAK) 10 MG TABS (PREDNISONE) Take tapering dose of prednisone over 6 days starting with 6081mnd finishing with 13m79m1 pack x 0   Entered and Authorized by:   KathLucy Chris  Signed by:   KathLucy Chrison 12/07/2007   Method used:   [facility]        [facility] [phone])       [address]   [place]  [id number]   Ph: [phone]   Fax: [phone]:   [id number] Medication Administration  Injection # 1:    Medication: Solumedrol up to 125mg55mDiagnosis: COPD (ICD-496)    Route: IM    Site: RUOQ gluteus    Exp Date: 05/11/2009    Lot #: [id number]: [employer]    Comments: PT GIVEN 40MG TOTAL OF SOLUMEDROL    Patient tolerated injection without complications    Given by: Kay GNadine CountsADeborra Medinatober 27, 2009 4:31 PM)  Orders Added: 1)  T-Hgb A1C (in-house) [[id number]] 2)  T-Comprehensive Metabolic Panel [[id number]] 3)  Est. Patient Level IV [47159] 4)  Solumedrol up to 166m [J2930]   Laboratory Results   Blood Tests   Date/Time Received: December 07, 2007 4:53 PM  Date/Time Reported: VMelvia Heaps December 07, 2007 4:53 PM   HGBA1C: 6.7%   (Normal Range: Non-Diabetic - 3-6%   Control Diabetic - 6-8%)

## 2010-03-12 NOTE — Assessment & Plan Note (Signed)
Summary: Low Hgb per Dr. Donnella Bi   Vital Signs:  Patient profile:   60 year old female Height:      64 inches (162.56 cm) Weight:      168.6 pounds (76.64 kg) BMI:     29.04 Temp:     98.7 degrees F (37.06 degrees C) oral Pulse rate:   98 / minute BP sitting:   103 / 61  (right arm)  Vitals Entered By: Hilda Blades Ditzler RN (August 29, 2009 3:19 PM) Is Patient Diabetic? Yes Did you bring your meter with you today? Yes Pain Assessment Patient in pain? yes     Location: right upper abd Intensity: 8 Type: sharp Onset of pain  worse Nutritional Status BMI of 25 - 29 = overweight Nutritional Status Detail appetite ok CBG Result 150  Have you ever been in a relationship where you felt threatened, hurt or afraid?denies   Does patient need assistance? Functional Status Self care Ambulation Normal Comments To be admitted Hgb 8.0 per Dr Donnella Bi per pt. Results [place] today.   Primary Care Provider:  Trinidad Curet MD   History of Present Illness: Patient is a 60 year old female who presents today for follow up of her laboratory work yesterday as well as a RUQ ultrasound and acute abdomen films.  Her HgB was 8.1 yesterday which is down from 14.0 a year and a half ago.  She continues to have RUQ pain.  She hasn't taken her pain medications because she has had to drive.  She rates the pain a 10/10 that goes straight to her back.  She denies any diarrhea, vomiting, dysuria, or blood in the urine.  She states that she hasn't noticed any changes in her stools but doesn't look every day.    Depression History:      The patient denies a depressed mood most of the day and a diminished interest in her usual daily activities.        The patient denies that she feels like life is not worth living, denies that she wishes that she were dead, and denies that she has thought about ending her life.         Preventive Screening-Counseling & Management  Alcohol-Tobacco     Alcohol drinks/day: 0     Smoking  Status: current     Smoking Cessation Counseling: yes     Packs/Day: 1.5     Year Started: AT THE AGE OF 12  Caffeine-Diet-Exercise     Does Patient Exercise: no  Current Medications (verified): 1)  Prilosec 40 Mg Cpdr (Omeprazole) .... Take 1 Tablet By Mouth Once A Day 2)  Alprazolam 0.5 Mg  Tabs (Alprazolam) .... One Pill Daily As Needed For Anxiety 3)  Albuterol 90 Mcg/act Aers (Albuterol) .... Two Puffs Every Four Hours As Needed For Shortness of Breath 4)  Spiriva Handihaler 18 Mcg Caps (Tiotropium Bromide Monohydrate) .... Inhale Contents of 1 Capsule Once A Day 5)  Crestor 20 Mg  Tabs (Rosuvastatin Calcium) .... Take 1 Tablet By Mouth Once A Day 6)  Advair Diskus 250-50 Mcg/dose Misc (Fluticasone-Salmeterol) .Marland Kitchen.. 1 Puff Twice Daily 7)  Glucophage Xr 500 Mg Xr24h-Tab (Metformin Hcl) .... 2 Pills Twice A Day 8)  Cymbalta 30 Mg Cpep (Duloxetine Hcl) .... Take 3  Tables By Mouth Once Daily 9)  Aspir-Low 81 Mg Tbec (Aspirin) .... Take 1 Tablet By Mouth Once A Day 10)  Glipizide Xl 10 Mg  Tb24 (Glipizide) .... Take 1 Tablet By Mouth  Twice A Day 11)  Hydrocodone-Ibuprofen 7.5-200 Mg Tabs (Hydrocodone-Ibuprofen) .... Take One Tab By Mouth Once Every 6 Hours As Needed For Pain 12)  Lantus 100 Unit/ml Soln (Insulin Glargine) .... Inject 34 Units Subcutaneously Daily and Increase Until Fasting Blood Sugars Are Mostly Less Than 130 Mg/dl 13)  Onetouch Ultra Test  Strp (Glucose Blood) .... Use To Test Blood Sugars Three Times A Day 14)  Insulin Syringe 31g X 5/16" 0.3 Ml Misc (Insulin Syringe-Needle U-100) .... Use To Inject Lantus Insulin Once Daily 15)  Lisinopril 5 Mg Tabs (Lisinopril) .... Take 1 Tablet By Mouth Once A Day 16)  Nystatin 100000 Unit/gm Crea (Nystatin) .... Apply To The Affected Area 3-4 Times Daily As Needed 17)  Loratadine 10 Mg Tabs (Loratadine) .... Take 1 Tablet By Mouth Once A Day 18)  Wellbutrin Xl 150 Mg Xr24h-Tab (Bupropion Hcl) .... Take 1 Tablet Once Daily 19)   Promethazine Hcl 12.5 Mg Tabs (Promethazine Hcl) .... Take 1 Pill By Mouth Three Times A Day As Needed For Nausea and Vomiting.  Allergies: 1)  ! Ultram 2)  ! Morphine  Past History:  Past medical, surgical, family and social histories (including risk factors) reviewed for relevance to current acute and chronic problems.  Past Medical History: Reviewed history from 03/02/2007 and no changes required. COPD Depression Diabetes mellitus, type I Osteoporosis Anxiety Low back pain Chronic diffuse pain involving the back, arms, and legs  Family History: Reviewed history from 11/30/2008 and no changes required. No significant family medical history  Social History: Reviewed history from 12/07/2007 and no changes required. Lives alone.  No EtOH.  No illegal drugs.  Poor relationship with children.    Review of Systems      See HPI  Physical Exam  Additional Exam:  General: Well developed, female in no acute distress  Head: Atraumatic, normocephalic with no signs of trauma  Eyes: PERRLA, EOM intact  Ears: TM intact  Nose: Nares patent, mucosa is pink and moist, no polyps noted  Mouth: Mucosa is pink and moist, dention,   Neck: Supple, full ROM, no thyromegaly or masses noted  Resp: Clear to ascultation bilaterally, no wheezes, rales, or rhonchi noted  CV: Regular rate and rhythm with no murmurs, rubs, or gallops noted  Abdomen: Soft non-distended with normal bowel sounds.  There is moderate pain to palpation in the RUQ that is worse with inspiration.  Pain is directly over the costal margins and not below the rib cage.  The pain is worst directly over the lowest rib and the costal spaces.  There is rebound tenderness as well as CVA tenderness.  There is pain to percussion over the flank as well Musculoskelatal: ROM full with intact strength, no pain to palpation  Neurologic: Alert and oriented x3, Cranial nerves II-XII grossly intact, DTR normal and symmetric  Pulses: Radial,  brachial, carotid, femoral, dorsal pedis, and posterior tibial pulses equal and symmetric     Impression & Recommendations:  Problem # 1:  ANEMIA (ICD-285.9)  On recheck today her HgB is 7.9 which is unchanged from yesterday.  We will also do some iron studies to check on her iron status in the face of a possible slow GI bleed.  She has an appointment with [name] who she has seen in the past on August 1st for evaluation.  We will have her return on Monday July 25th for a repeat HgB to ensure it is stable.  We will also start her on Iron.  She was advised to call if she notices blood in her stool, urine, or if she coughs up blood.  I spoke with [name] today who feels 09/10/09 is soon enough to see her as long as she stays stable. He did look up and find that she had ahd a normal EGD and colonoscopy. He did not have a record of her capsule study but we assume it was normal as well. WWoodyear,MD  Her updated medication list for this problem includes:    Ferrous Sulfate 325 (65 Fe) Mg Tabs (Ferrous sulfate) .Marland Kitchen... Take 1 tablet by mouth two times a day for low blood iron  Hgb: 8.1 (08/28/2009)   Hct: 25.1 (08/28/2009)   Platelets: 240 (08/28/2009) RBC: 3.25 (08/28/2009)   RDW: 18.6 (08/28/2009)   WBC: 9.1 (08/28/2009) MCV: 77.4 (08/28/2009)   MCHC: 32.3 (08/28/2009) B12: 330 (04/18/2008)   TSH: 0.997 (04/18/2008)  Orders: T-CBC w/Diff (11021-11735) Hemoccult Guaiac-1 spec.(in office) (82270) T-Iron (67014-10301) T-Iron Binding Capacity (TIBC) ([id number]) T-Ferritin (79728-20601) T-Reticulocyte Count, Manual (56153)  Problem # 2:  RUQ PAIN (ICD-789.01) Ultrasound of the RUQ and acute abdominal x-ray series are normal.  On physical exam today the pain seems centered over the rib cage and related to the bony structures and costocondritis.  We will change her Hydrocodone- Ibuprofen to Hydrocodone-acetaminophen because of the question of GI bleeds and she is advised to take it as needed for  the pain but to be cautious with driving.  Complete Medication List: 1)  Prilosec 40 Mg Cpdr (Omeprazole) .... Take 1 tablet by mouth once a day 2)  Alprazolam 0.5 Mg Tabs (Alprazolam) .... One pill daily as needed for anxiety 3)  Albuterol 90 Mcg/act Aers (Albuterol) .... Two puffs every four hours as needed for shortness of breath 4)  Spiriva Handihaler 18 Mcg Caps (Tiotropium bromide monohydrate) .... Inhale contents of 1 capsule once a day 5)  Crestor 20 Mg Tabs (Rosuvastatin calcium) .... Take 1 tablet by mouth once a day 6)  Advair Diskus 250-50 Mcg/dose Misc (Fluticasone-salmeterol) .Marland Kitchen.. 1 puff twice daily 7)  Glucophage Xr 500 Mg Xr24h-tab (Metformin hcl) .... 2 pills twice a day 8)  Cymbalta 30 Mg Cpep (Duloxetine hcl) .... Take 3  tables by mouth once daily 9)  Aspir-low 81 Mg Tbec (Aspirin) .... Take 1 tablet by mouth once a day 10)  Glipizide Xl 10 Mg Tb24 (Glipizide) .... Take 1 tablet by mouth twice a day 11)  Hydrocodone-acetaminophen 7.5-325 Mg Tabs (Hydrocodone-acetaminophen) .... Take 1 tablet every 4-6 hours by mouth as needed for the pain. 12)  Lantus 100 Unit/ml Soln (Insulin glargine) .... Inject 34 units subcutaneously daily and increase until fasting blood sugars are mostly less than 130 mg/dl 13)  Onetouch Ultra Test Strp (Glucose blood) .... Use to test blood sugars three times a day 14)  Insulin Syringe 31g X 5/16" 0.3 Ml Misc (Insulin syringe-needle u-100) .... Use to inject lantus insulin once daily 15)  Lisinopril 5 Mg Tabs (Lisinopril) .... Take 1 tablet by mouth once a day 16)  Nystatin 100000 Unit/gm Crea (Nystatin) .... Apply to the affected area 3-4 times daily as needed 17)  Loratadine 10 Mg Tabs (Loratadine) .... Take 1 tablet by mouth once a day 18)  Wellbutrin Xl 150 Mg Xr24h-tab (Bupropion hcl) .... Take 1 tablet once daily 19)  Promethazine Hcl 12.5 Mg Tabs (Promethazine hcl) .... Take 1 pill by mouth three times a day as needed for nausea and  vomiting. 20)  Ferrous Sulfate  325 (65 Fe) Mg Tabs (Ferrous sulfate) .... Take 1 tablet by mouth two times a day for low blood iron  Other Orders: Capillary Blood Glucose/CBG (92446)  Patient Instructions: 1)  Stop taking Hydrocodone/Ibuprofen 2)  Start taking Hydrocodone-Acetaminophen 7.5/325 every 4-6 hours as needed for the side pain. 3)  Start Iron pills twice daily. 4)  Continue all other medications. 5)  Return to clinic on Monday 09/03/09 for a repeat CBC. 6)  Keep your appointment for [name] on August 1st. 7)  Call if you notice bright red blood in your stool.  Prescriptions: FERROUS SULFATE 325 (65 FE) MG TABS (FERROUS SULFATE) Take 1 tablet by mouth two times a day for low blood iron  #60 x 3   Entered and Authorized by:   Trish Fountain MD   Signed by:   Trish Fountain MD on 08/29/2009   Method used:   Print then Give to Patient   RxID:   [id number] HYDROCODONE-ACETAMINOPHEN 7.5-325 MG TABS (HYDROCODONE-ACETAMINOPHEN) Take 1 tablet every 4-6 hours by mouth as needed for the pain.  #120 x 0   Entered and Authorized by:   Trish Fountain MD   Signed by:   Trish Fountain MD on 08/29/2009   Method used:   Print then Give to Patient   RxID:   [id number]  Process Orders Check Orders Results:     [facility]: Check successful Tests Sent for requisitioning (August 30, 2009 11:25 AM):     08/29/2009: [facility] -- T-CBC w/Diff [[id number]] (signed)     08/29/2009: [facility] -- Alric Quan [[id number]] (signed)     08/29/2009: [facility] -- T-Iron Binding Capacity (TIBC) [[id number]] (signed)     08/29/2009: [facility] -- T-Ferritin [[id number]] (signed)     08/29/2009: [facility] -- T-Reticulocyte Count, Manual [phone] (signed)

## 2010-03-12 NOTE — Progress Notes (Signed)
Summary: refill/ hla  Phone Note Refill Request Message from:  Fax from Pharmacy on January 05, 2007 12:06 PM  Refills Requested: Medication #1:  HYDROCHLOROTHIAZIDE 25 MG TABS Take 1 tablet by mouth once a day   Last Refilled: 12/02/2006  Medication #2:  GLIPIZIDE 5 MG TABS 1/2 pill twice daily with meals   Last Refilled: 12/02/2006 Initial call taken by: Freddy Finner RN,  January 05, 2007 12:07 PM  Follow-up for Phone Call        Dr. Grier Rocher has refilled these.   Follow-up by: Lucy Chris MD,  January 05, 2007 2:52 PM

## 2010-03-12 NOTE — Progress Notes (Signed)
Summary: hypoglycemia/dmr  Phone Note Outgoing Call   Call placed by: Barnabas Harries RD,CDE,  Jun 22, 2008 9:12 AM Summary of Call: patient related at class that she had "bottomed out" several times after starting mealtime insulin recently. Because it was late on a Friday and she felt she had no one to call, she self adjusted her mealtime insulin and glipizide. Asked me to email and mail her information on recommended carbs per meal and how to keep her carbs consistent. Informed her that this would be best accomplished in an appointment. Sent her an email yesterday and mailed her the same information today, strongly encouraged appointment.   Letter and email mapped out a mealplan to keep carbs consistent at  ~ 45-55 grams each meal if she is going to take 5 units  Novolog at meals.        Appended Document: hypoglycemia/dmr Pt needs appt to be seen.

## 2010-03-12 NOTE — Progress Notes (Signed)
Summary: phone note/gp  Phone Note Call from Patient   Caller: Patient Summary of Call: Pt. c/o head cold - no fever, has a h/a, nasal congestion,and sneezing.  States she's on many medications and she doesn't know what to buy at the drugstore that won't interfere with her regular meds.  Can you suggest something she can take? Or if you call in a RX-send it to [facility] on [address].  And states she's sorry about missing her appt. this morning. Initial call taken by: Morrison Old RN,  March 14, 2008 4:02 PM  Follow-up for Phone Call        It depends on which symptom is most bothersome.  For nasal congestion she can take Afrin nasal spray and  for headache she can take Tylenol (or her Vicodin).   Follow-up by: Lucy Chris MD,  March 15, 2008 9:01 AM  Additional Follow-up for Phone Call Additional follow up Details #1::        Pt. was called and instructions given per Dr. Selinda Flavin about her headcold. Additional Follow-up by: Morrison Old RN,  March 15, 2008 9:37 AM

## 2010-03-12 NOTE — Miscellaneous (Signed)
Summary: Orders Update  Clinical Lists Changes  Orders: Added new Referral order of Social Work Referral (Social ) - Signed

## 2010-03-12 NOTE — Assessment & Plan Note (Signed)
Summary: Soc. Work/Smoking Cessation   Social Work Evaluation Date  07/28/2007 Patient name [name]  Primary MD   : Lucy Chris MD Social Worker's name : Katy Fitch MSW- [place] [phone]    Cell phone: .  Marland Kitchen     Alternate phone: . Marland Kitchen       Individual making referral: Dr. Selinda Flavin  Primary Reason for Referral:      Smoking Cessation Counseling Comments Smoked since age 60.  Smokes pack and a half per day. Smokes low tar cigarettes.  Also, diabetic and connected with Barnabas Harries.    Not a lot of family support.  Dgtr in [place] has four grandchildren.   Another adult dgtr in [place].  Tells me she does alot of things alone.  Uses the internet frequently during the day.   Does walk her dog and sees her neighbors.  No regular exercise program.  Primary motive for quitting is the money.  Tried to quit cold [place] years ago when she worked and quit for 6 weeks. Said she used Chantix two years ago and quit for a couple of months.   Action taken by Social Work: Met with patient in exam room for about 30 minutes. Written plan in place.  Leannah will start the patch by next week once all her cigarettes are gone. She's been advised that the patch doubles the quit rate but needs to have other pieces in place to deal with the loss of cigarettes in her life.  She expresses understanding of the emotional and habitual component of smoking.   She is starting with a 14 mg patch.  I advised her that she likely needs the 21 mg but she is adamant that the 14 mg will be enough since she smokes lower nicotine/tar cigarettes.  I told her that since she gets up first thing in the morning and smokes a cigarette and then around 30 cigarettes every day that she really needs to be on the 21 mg patch to start out with.      She's been advised to clean her house and car of all smoking materials.  She's been advised how to deal with her triggers and has a handout to place on her  refrigerator.   She's been advised to get out as much as possible since boredom seems to be her main trigger.  She's been encouraged to pursue P/T and do some of those exercises at home to replace the smoking.   F/U by Social Work next week to see how she's doing.  Pt. has been encouraged to contact me and friends for continued support.

## 2010-03-12 NOTE — Progress Notes (Signed)
  Phone Note Call from Patient Call back at Home Phone [phone]   Summary of Call: Patient called saying her flank pain is hurting a lot. No NV fever or chills. Eating/ drinking okay. Urine out put okay. Under treatment for mild pyelo and musculoskeletal pain. Taking bactrim. Advil not helping with pain. Advised patient to take naproxen as given by Dr. Alfonse Alpers (renal fx is normal). Advised to drink plenty of fluid. Advised to come back to ED / clinic if pain worsens, fever, NV or any concerns.  Initial call taken by: Dawna Part MD,  January 16, 2008 7:52 AM

## 2010-03-12 NOTE — Assessment & Plan Note (Signed)
Summary: healthy lifestyle calss/dmr  Patient attended the 1 hour [employer]: Lifestyle meeting today. The meeting included information about: cooking- a food demo was done, recipes, healthy eating, moving our body more and goal setting, expectations, how to cope with set backs.  Allergies: 1)  ! Ultram 2)  ! Morphine   Complete Medication List: 1)  Prilosec Otc 20 Mg Tbec (Omeprazole magnesium) .... Take two tablets by mouth daily 2)  Alprazolam 0.5 Mg Tabs (Alprazolam) .... One pill daily as needed for anxiety 3)  Albuterol 90 Mcg/act Aers (Albuterol) .... Two puffs every four hours as needed for shortness of breath 4)  Hydrochlorothiazide 25 Mg Tabs (Hydrochlorothiazide) .... Take 1 tablet by mouth once a day 5)  Spiriva Handihaler 18 Mcg Caps (Tiotropium bromide monohydrate) .... Inhale contents of 1 capsule once a day 6)  Crestor 20 Mg Tabs (Rosuvastatin calcium) .... Take 1 tablet by mouth once a day 7)  Advair Diskus 250-50 Mcg/dose Misc (Fluticasone-salmeterol) .Marland Kitchen.. 1 puff twice daily 8)  Glucophage Xr 500 Mg Tb24 (Metformin hcl) .... 2 pills in am, 2 pills in pm 9)  Cymbalta 30 Mg Cpep (Duloxetine hcl) .... Take 3  tables by mouth once daily 10)  Aspir-low 81 Mg Tbec (Aspirin) 11)  Glipizide Xl 10 Mg Tb24 (Glipizide) .... Take 1 tablet by mouth twice a day 12)  Hydrocodone-ibuprofen 7.5-200 Mg Tabs (Hydrocodone-ibuprofen) .... Take one tab by mouth once every 6 hours as needed for pain 13)  Loratadine 10 Mg Tabs (Loratadine) .... Take 1 tablet by mouth once a day 14)  Ferrous Sulfate 325 (65 Fe) Mg Tbec (Ferrous sulfate) .... Take 1 tablet by mouth three times a day 15)  Lantus 100 Unit/ml Soln (Insulin glargine) .... Inject 30 units subcutaneously daily and increase until fasting blood sugars are mostly less than 130 mg/dl 16)  Onetouch Ultra Test Strp (Glucose blood) .... Use to test blood sugars three times a day 17)  Insulin Syringe 31g X 5/16" 0.3 Ml Misc (Insulin  syringe-needle u-100) .... Use to inject lantus insulin once daily 18)  Novolog 100 Unit/ml Soln (Insulin aspart) .... Inject 5 units with each meal.  Other Orders: No Charge Patient Arrived (NCPA0) (NCPA0)

## 2010-03-12 NOTE — Progress Notes (Signed)
  Phone Note Outgoing Call   Call placed by: Lucky Rathke NT II,  November 17, 2008 1:50 PM Call placed to: Patient Details for Reason: [place] MD Summary of Call: SPOKE WITH MS Wivell, TO LET HER KNOW THAT DR BOGGALA HAD CALLED IN [place] TO [place]. PATIETN IS DM.  GLADYS RN SPOKE WITH MS Cosman,  SHE WANTED T KNOW IF THIS WAS A TYPE OF INFECTION.NURSE GLADYS EXPLAINED WHAT IT WAS TO HER.  Lela Sturdivant NT II  November 17, 2008 1:54 PM

## 2010-03-12 NOTE — Progress Notes (Signed)
Summary: diabetes support/dmr  Phone Note Call from Patient   Summary of Call: Patient called after hours pager- CBG 283. She is worried-this is the highest her CBG has been. She took 17 units of insulin in am. No change in CBG. She will need a follow-up appointment in clinic. Provided reassurance to patient. Initial call taken by: [place],  May 24, 2008 7:53 AM  Follow-up for Phone Call        took glipizide XL last night and.this dropped blood sugar from 283 to 177. would not have called if she knew why her blood sugar was so high. Her blood sugar was 179 this morning and she took 18 units.   Helped patient troubleshoot possible reasons why blood sugar might have been elevated yesterday. she said she was packing (more active than usual) and didn;t eat as much. Discussed that she may have had a low blood sugar previously to having checked. Took an extra Glipizide XL 10 mg last night, so may be at risk again today for hypoglycemia- although today she plans to sleep and rest, advised her to test blood sugar every 4 hours today to detect and  prevent hypoglycemia.  Patient asked if she could take an extra glipizide the next time her blood sugar is elevated. Discussed with her that long acting medicines like glipizide are not appropriate for correcting blood sugars and if she wanted medicine for correcting her blood sugar - she'd need to speak with her physician.   Follow-up by: Barnabas Harries RD,CDE,  May 24, 2008 12:38 PM  Additional Follow-up for Phone Call Additional follow up Details #1::        Thank you! I will cc: Dr. Pearline Cables for follow-up appointment.  Additional Follow-up by: Rhea Pink  DO,  May 24, 2008 8:27 PM

## 2010-03-12 NOTE — Progress Notes (Signed)
Summary: Refill/gh  Phone Note Refill Request Message from:  Patient on Jun 25, 2009 4:14 PM  Refills Requested: Medication #1:  PRILOSEC OTC 20 MG  TBEC Take two tablets by mouth daily Pt would like to get a prescription for Prilosec 40 mg or Omeprazole 40 mg tablets.  Call pt when the refill is done. and the Prior Authorization is completed.   Method Requested: Electronic Initial call taken by: Sander Nephew RN,  Jun 25, 2009 4:15 PM  Follow-up for Phone Call        completed refill, thank you Iskra     Prescriptions: PRILOSEC OTC 20 MG  TBEC (OMEPRAZOLE MAGNESIUM) Take two tablets by mouth daily  #60 x 11   Entered and Authorized by:   Trinidad Curet MD   Signed by:   Trinidad Curet MD on 06/25/2009   Method used:   Faxed to ...       [facility] (mail-order)       [address] [place], [place]  [address]       Ph: [phone]       Fax: [phone]   Nora:   [phone]

## 2010-03-12 NOTE — Discharge Summary (Signed)
Summary: Hospital Discharge Update    Hospital Discharge Update:  Date of Admission: 09/03/2009 Date of Discharge: 09/07/2009  Brief Summary:  Pt is a 60YO female with PMHx of Iron-deficiency anemia, COPD, DM, HLD who was a direct admission from the clinic secondary to 1 month history of worsening weakness, shortness of breath, and epigastric pain in the setting of worsening symptomatic anemia (Hgb 7.9, with baseline of 9.5). Pt was transfused 2 units PRBCs and Hgb rose to 9.2, then stabilized at 8.9, and remained stable through hospital course. Anemia thought to be possibly 2/2 to GI cause, although currently neg FOBT. Previous colonoscopy 3 years ago was positive for 2 polyps, 1 hyperplastic, 1 adenomatous). Also pt with persistent abdominal pain.Therefore GI consulted who said because no overt bleed at this time and patient is stable, will follow-up for possible outpatient EGD in 3 weeks and next colonoscopy in 2012.  SOB thought to be possibly 2/2 anemia versus deconditioning with poor respiratory function due to COPD. CXR negative for PNA, but did show very small right pleural effusion. Pt continues to smoke, but was provided smoking cessation counselling. Depression managed with outpatient regimen. Stable during hospital course, but patient has long history of depression which still seems to be present.   Labs needed at follow-up: CBC with differential  Other follow-up issues:  1. Please make sure pt follows up with GI ([name]) 2. Follow-up on SOB because pt found to have very small pleural effusion. 3. Depression should be addressed at some follow-up visit, specifically with possible referral for counselling services.  Medication list changes:  Removed medication of GLIPIZIDE XL 10 MG  TB24 (GLIPIZIDE) Take 1 tablet by mouth twice a day Changed medication from LANTUS 100 UNIT/ML SOLN (INSULIN GLARGINE) Inject 34 units subcutaneously daily and increase until fasting blood sugars are  mostly less than 130 mg/dl to LANTUS 100 UNIT/ML SOLN (INSULIN GLARGINE) Inject 25 units subcutaneously daily and increase until fasting blood sugars are mostly less than 130 mg/dl Removed medication of GLUCOPHAGE XR 500 MG XR24H-TAB (METFORMIN HCL) 2 pills twice a day Removed medication of NYSTATIN 100000 UNIT/GM CREA (NYSTATIN) apply to the affected area 3-4 times daily as needed Added new medication of COLACE 100 MG CAPS (DOCUSATE SODIUM) Take 1 tablet by mouth two times a day - Signed Removed medication of PROMETHAZINE HCL 12.5 MG TABS (PROMETHAZINE HCL) take 1 pill by mouth three times a day as needed for nausea and vomiting. Removed medication of LISINOPRIL 5 MG TABS (LISINOPRIL) Take 1 tablet by mouth once a day Rx of COLACE 100 MG CAPS (DOCUSATE SODIUM) Take 1 tablet by mouth two times a day;  #60 x 2;  Signed;  Entered by: [name] DO;  Authorized by: [name] DO;  Method used: Print then Give to Patient  Discharge medications:  PRILOSEC 40 MG CPDR (OMEPRAZOLE) Take 1 tablet by mouth once a day ALPRAZOLAM 0.5 MG  TABS (ALPRAZOLAM) one pill daily as needed for anxiety ALBUTEROL 90 MCG/ACT AERS (ALBUTEROL) Two puffs every four hours as needed for shortness of breath SPIRIVA HANDIHALER 18 MCG CAPS (TIOTROPIUM BROMIDE MONOHYDRATE) Inhale contents of 1 capsule once a day CRESTOR 20 MG  TABS (ROSUVASTATIN CALCIUM) Take 1 tablet by mouth once a day ADVAIR DISKUS 250-50 MCG/DOSE MISC (FLUTICASONE-SALMETEROL) 1 puff twice daily CYMBALTA 30 MG CPEP (DULOXETINE HCL) Take 3  tables by mouth once daily ASPIR-LOW 81 MG TBEC (ASPIRIN) Take 1 tablet by mouth once a day HYDROCODONE-ACETAMINOPHEN 7.5-325 MG TABS (HYDROCODONE-ACETAMINOPHEN)  Take 1 tablet every 4-6 hours by mouth as needed for the pain. LANTUS 100 UNIT/ML SOLN (INSULIN GLARGINE) Inject 25 units subcutaneously daily and increase until fasting blood sugars are mostly less than 130 mg/dl     ONETOUCH ULTRA TEST  STRP  (GLUCOSE BLOOD) Use to test blood sugars three times a day     INSULIN SYRINGE 31G X 5/16" 0.3 ML MISC (INSULIN SYRINGE-NEEDLE U-100) use to inject lantus insulin once daily LORATADINE 10 MG TABS (LORATADINE) Take 1 tablet by mouth once a day WELLBUTRIN XL 150 MG XR24H-TAB (BUPROPION HCL) take 1 tablet once daily FERROUS SULFATE 325 (65 FE) MG TABS (FERROUS SULFATE) Take 1 tablet by mouth two times a day for low blood iron COLACE 100 MG CAPS (DOCUSATE SODIUM) Take 1 tablet by mouth two times a day  Other patient instructions:  1. Please follow-up at the Internal medicine clinic on 09/14/09 at 10:00AM. 2. Please follow-up with [name] (gastroenterology), on 10/01/09 at 10:00AM. 3. If having blood in stools, please also be sure to call [name]' office [phone]). 4. Please come to the ER if you have worsening shortness of breath, worsening lightheadedness or dizziness, loss of conscious, severe bleeding.   Note: Hospital Discharge Medications & Other Instructions handout was printed, one copy for patient and a second copy to be placed in hospital chart.

## 2010-03-12 NOTE — Progress Notes (Signed)
Summary: refillgg  Phone Note Refill Request  on November 04, 2006 12:14 PM  Refills Requested: Medication #1:  ALPRAZOLAM 0.5 MG  TABS one pill daily as needed for anxiety   Last Refilled: 07/29/2006  Medication #2:  LIPITOR 20 MG TABS Take 1 tablet by mouth once a day   Last Refilled: 08/31/2006  Medication #3:  GLIPIZIDE 5 MG TABS 1/2 pill twice daily with meals   Last Refilled: 09/29/2006 *Alprazolam 100   cmet 11/16/2005   Method Requested: electronic Initial call taken by: Gevena Cotton RN,  November 04, 2006 12:15 PM  Follow-up for Phone Call        Pt of Dr. Selinda Flavin.  PLease send to her.   Follow-up by: Efraim Kaufmann MD,  November 04, 2006 12:31 PM  Additional Follow-up for Phone Call Additional follow up Details #1::        Refill approved Additional Follow-up by: Lucy Chris MD,  November 04, 2006 4:00 PM      Prescriptions: GLIPIZIDE 5 MG TABS (GLIPIZIDE) 1/2 pill twice daily with meals  #60 x 2   Entered and Authorized by:   Lucy Chris MD   Signed by:   Lucy Chris MD on 11/04/2006   Method used:   Telephoned to ...       [facility]       [place], [place]  [address]       Ph: [phone]       Fax: [phone]   RxID:   [id number] LIPITOR 20 MG TABS (ATORVASTATIN CALCIUM) Take 1 tablet by mouth once a day  #31 x 2   Entered and Authorized by:   Lucy Chris MD   Signed by:   Lucy Chris MD on 11/04/2006   Method used:   Telephoned to ...       [place], [place]  [address]       Ph: [phone]       Fax: [phone]   RxID:   [id number] ALPRAZOLAM 0.5 MG  TABS (ALPRAZOLAM) one pill daily as needed for anxiety  #100 x 0   Entered and Authorized by:   Lucy Chris MD   Signed by:   Lucy Chris MD on 11/04/2006   Method used:   Telephoned to ...       [facility]       [address]       [place], [place]  [address]     Ph: [phone]       Fax: [phone]   RxID:   [id number]    Rxs called

## 2010-03-12 NOTE — Progress Notes (Signed)
Summary: Appointment  Phone Note Outgoing Call   Call placed by: Sander Nephew RN,  August 29, 2009 11:10 AM Call placed to: Patient Summary of Call: Pt called and scheduled with an appointment for 2:30 PM this afternoon. Sander Nephew RN  August 29, 2009 11:11 AM per order to schedule an appointment by Dr. Donnella Bi.Sander Nephew RN  August 29, 2009 11:12 AM  Initial call taken by: Sander Nephew RN,  August 29, 2009 11:10 AM  Follow-up for Phone Call        thank you Iskra Follow-up by: Trinidad Curet MD,  August 29, 2009 12:27 PM

## 2010-03-12 NOTE — Progress Notes (Signed)
Summary: refill/ hla  Phone Note Refill Request Message from:  Patient on April 04, 2008 3:11 PM  Refills Requested: Medication #1:  CRESTOR 20 MG  TABS Take 1 tablet by mouth once a day  Medication #2:  CYMBALTA 30 MG CPEP Take 3  tables by mouth once daily  Medication #3:  TOPAMAX 25 MG  TABS Take 1 tablet by mouth twice a day Initial call taken by: Freddy Finner RN,  April 04, 2008 3:11 PM  Follow-up for Phone Call        Medications refilled.  Pt has appt with me 3/9 at which time labs will be obtained.   Follow-up by: Lucy Chris MD,  April 04, 2008 3:21 PM      Prescriptions: TOPAMAX 25 MG  TABS (TOPIRAMATE) Take 1 tablet by mouth twice a day  #62 x 5   Entered and Authorized by:   Lucy Chris MD   Signed by:   Lucy Chris MD on 04/04/2008   Method used:   Electronically to        [facility] [phone]* (retail)       [address]       [place], [place]  [address]       Ph: [phone]       Fax: [phone]   RxID:   [id number] CYMBALTA 30 MG CPEP (DULOXETINE HCL) Take 3  tables by mouth once daily  #90 x 5   Entered and Authorized by:   Lucy Chris MD   Signed by:   Lucy Chris MD on 04/04/2008   Method used:   Electronically to        [facility] [phone]* (retail)       [place], [place]  [address]       Ph: [phone]       Fax: [phone]   RxID:   [id number] CRESTOR 20 MG  TABS (ROSUVASTATIN CALCIUM) Take 1 tablet by mouth once a day  #31 x 5   Entered and Authorized by:   Lucy Chris MD   Signed by:   Lucy Chris MD on 04/04/2008   Method used:   Electronically to        [facility] [phone]* (retail)       [address]       [place], [place]  [address]       Ph: [phone]       Fax: [phone]   RxID:   [id number]

## 2010-03-12 NOTE — Progress Notes (Signed)
Summary: Cancelled appointment  Phone Note Outgoing Call   Call placed by: Sander Nephew RN,  September 05, 2009 2:03 PM Call placed to: Specialist Summary of Call: Call to [facility]-Dr. Stanford Breed pt's appointment that was scheduled for Friday July 29,2011 has been cancelled per Dr. Frederic Jericho.  Pt is inpatient. Sander Nephew RN  September 05, 2009 2:04 PM  Initial call taken by: Sander Nephew RN,  September 05, 2009 2:04 PM

## 2010-03-12 NOTE — Progress Notes (Signed)
Summary: phone/gg  Phone Note From Pharmacy   Summary of Call: pharmacy would like to change prilosec to omeprazole.  Rx was sent in for prilosec.  Will this change be okay with you? Initial call taken by: Gevena Cotton RN,  February 27, 2009 10:43 AM  Follow-up for Phone Call        Generic can be substituted.  OK for omeprazole.  Do I need to write an RX? Follow-up by: [name] MD,  March 01, 2009 11:30 AM

## 2010-03-12 NOTE — Progress Notes (Signed)
Summary: refill/ hla  Phone Note Refill Request Message from:  Fax from Pharmacy on August 16, 2007 11:18 AM  Refills Requested: Medication #1:  PRILOSEC OTC 20 MG  TBEC Take two tablets by mouth daily   Last Refilled: 6/23 Initial call taken by: Freddy Finner RN,  August 16, 2007 11:18 AM  Follow-up for Phone Call        Refill approved-nurse to complete Follow-up by: Lucy Chris MD,  August 16, 2007 11:26 AM      Prescriptions: PRILOSEC OTC 20 MG  TBEC (OMEPRAZOLE MAGNESIUM) Take two tablets by mouth daily  #62 x 5   Entered and Authorized by:   Lucy Chris MD   Signed by:   Lucy Chris MD on 08/16/2007   Method used:   Electronically sent to ...       [place], [place]  [address]       Ph: [phone]       Fax: [phone]   RxID:   [id number]

## 2010-03-12 NOTE — Miscellaneous (Signed)
Summary: SW Referral  Referred by Barnabas Harries.   Phone counseling.   45 minutes.    Contacted pt.  I have worked with her before relating to disability paperwork.   This is a disabled pt with Medicare and low income.  Has transporation and drives.  Reports pain in feet and legs due to fibromyalgia.  Has multiple health issues as well as depression and anxiety. Cannot walk long distances and cannot sit for extended period of time per her report.  Her main request is that we supply a letter on her behalf so that she can be excused from jury duty on 4/22.  We also talked about other possible community resources that she could take advantage of.   She wasn't receptive to utilizing the [facility] as she said they had restrictive hours and also the water aerobics was quite Scientist, research (medical).     I did ask her to call [facility] and speak to the financial counselor there as they are likely to waive the coinsurance due to low income.  She identifies coinsurance as a problem for her due to her low income.  They have a financial person at [facility] who does charity care. When pt. comes in for appmt next week she can ask Dr. Selinda Flavin to write an order for physical therapy.   Clearly her main problem today was that she's needing a letter to send to the courts as "jury duty" creating quite a bit of stress and anxiety for the patient.  Encouraged pt. to visit with me or call as needed.

## 2010-03-12 NOTE — Assessment & Plan Note (Signed)
Summary: checkup/pcp-magick/hla   Vital Signs:  Patient profile:   60 year old female Height:      64 inches Weight:      171.01 pounds BMI:     29.46 Temp:     97.9 degrees F oral Pulse rate:   92 / minute BP sitting:   128 / 67  (left arm)  Vitals Entered By: Sander Nephew RN (Jun 20, 2009 1:46 PM) CC: Depression Is Patient Diabetic? Yes Did you bring your meter with you today? Yes Pain Assessment Patient in pain? yes     Location: legs Intensity: 7 Type: aching Onset of pain  Constant Nutritional Status BMI of 25 - 29 = overweight CBG Result 88  Have you ever been in a relationship where you felt threatened, hurt or afraid?No   Does patient need assistance? Functional Status Self care Ambulation Normal Comments Weakness in her legs.  [place] [address] on Monday am.   Primary Care Provider:  Trinidad Curet MD  CC:  Depression.  History of Present Illness: 60 yo female with PMH outlined below presents to [facility] for regular follow up appointment. She has no concerns at the time. No recent sicknesses or hospitalizaitons. No episodes of chest pain, SOB, palpitations. No specific abdominal or urinary concerns. No recent changes in appetite, weight, sleep patterns (has chronic difficulties with sleep, can fall asleep and sleeps only 4-5 hours per night), mood.   She still has been feeling depressed and thinks she needs better control of it.  Depression History:      The patient is having a depressed mood most of the day and has a diminished interest in her usual daily activities.  Positive alarm features for depression include insomnia, fatigue (loss of energy), and impaired concentration (indecisiveness).  However, she denies significant weight loss, significant weight gain, hypersomnia, psychomotor agitation, psychomotor retardation, feelings of worthlessness (guilt), and recurrent thoughts of death or suicide.        The patient denies that she feels like life is not worth living,  denies that she wishes that she were dead, and denies that she has thought about ending her life.         Preventive Screening-Counseling & Management  Alcohol-Tobacco     Alcohol drinks/day: 0     Smoking Status: current     Smoking Cessation Counseling: yes     Packs/Day: 1.5     Year Started: AT THE AGE OF 12  Problems Prior to Update: 1)  Upper Respiratory Infection, Acute  (ICD-465.9) 2)  Hypertension  (ICD-401.9) 3)  Microalbuminuria  (ICD-791.0) 4)  Dyslipidemia  (ICD-272.4) 5)  Hypertriglyceridemia  (ICD-272.1) 6)  Dental Pain  (ICD-525.9) 7)  Shingles  (ICD-053.9) 8)  Anemia  (ICD-285.9) 9)  Arthropathy Nec, Other Spec Site  (ICD-716.88) 10)  Bunion  (ICD-727.1) 11)  Aodm  (ICD-250.00) 12)  Hematochezia  (ICD-578.1) 13)  Colonic Polyps  (ICD-211.3) 14)  Pulmonary Hypertension  (ICD-416.8) 15)  Osteoporosis  (ICD-733.00) 16)  COPD  (ICD-496) 17)  Obstructive Sleep Apnea  (ICD-780.57) 18)  Depression  (ICD-311) 19)  Fibromyalgia  (ICD-729.1) 20)  Irritable Bowel Syndrome  (ICD-564.1) 21)  Gerd  (ICD-530.81) 22)  Health Maintenance Exam  (ICD-V70.0)  Medications Prior to Update: 1)  Prilosec Otc 20 Mg  Tbec (Omeprazole Magnesium) .... Take Two Tablets By Mouth Daily 2)  Alprazolam 0.5 Mg  Tabs (Alprazolam) .... One Pill Daily As Needed For Anxiety 3)  Albuterol 90 Mcg/act Aers (Albuterol) .... Two Puffs  Every Four Hours As Needed For Shortness of Breath 4)  Hydrochlorothiazide 25 Mg Tabs (Hydrochlorothiazide) .... Take 1 Tablet By Mouth Once A Day 5)  Spiriva Handihaler 18 Mcg Caps (Tiotropium Bromide Monohydrate) .... Inhale Contents of 1 Capsule Once A Day 6)  Crestor 20 Mg  Tabs (Rosuvastatin Calcium) .... Take 1 Tablet By Mouth Once A Day 7)  Advair Diskus 250-50 Mcg/dose Misc (Fluticasone-Salmeterol) .Marland Kitchen.. 1 Puff Twice Daily 8)  Glucophage Xr 500 Mg Xr24h-Tab (Metformin Hcl) .... 2 Pills Twice A Day 9)  Cymbalta 30 Mg Cpep (Duloxetine Hcl) .... Take 3  Tables By  Mouth Once Daily 10)  Aspir-Low 81 Mg Tbec (Aspirin) .... Take 1 Tablet By Mouth Once A Day 11)  Glipizide Xl 10 Mg  Tb24 (Glipizide) .... Take 1 Tablet By Mouth Twice A Day 12)  Hydrocodone-Ibuprofen 7.5-200 Mg Tabs (Hydrocodone-Ibuprofen) .... Take One Tab By Mouth Once Every 6 Hours As Needed For Pain 13)  Lantus 100 Unit/ml Soln (Insulin Glargine) .... Inject 30 Units Subcutaneously Daily and Increase Until Fasting Blood Sugars Are Mostly Less Than 130 Mg/dl 14)  Onetouch Ultra Test  Strp (Glucose Blood) .... Use To Test Blood Sugars Three Times A Day 15)  Insulin Syringe 31g X 5/16" 0.3 Ml Misc (Insulin Syringe-Needle U-100) .... Use To Inject Lantus Insulin Once Daily 16)  Lisinopril 5 Mg Tabs (Lisinopril) .... Take 1 Tablet By Mouth Once A Day 17)  Mucinex 600 Mg Xr12h-Tab (Guaifenesin) .... Take 1 Tablet Twice Daily For 10-14 Days 18)  Nystatin 100000 Unit/gm Crea (Nystatin) .... Apply To The Affected Area 3-4 Times Daily As Needed 19)  Loratadine 10 Mg Tabs (Loratadine) .... Take 1 Tablet By Mouth Once A Day  Current Medications (verified): 1)  Prilosec Otc 20 Mg  Tbec (Omeprazole Magnesium) .... Take Two Tablets By Mouth Daily 2)  Alprazolam 0.5 Mg  Tabs (Alprazolam) .... One Pill Daily As Needed For Anxiety 3)  Albuterol 90 Mcg/act Aers (Albuterol) .... Two Puffs Every Four Hours As Needed For Shortness of Breath 4)  Hydrochlorothiazide 25 Mg Tabs (Hydrochlorothiazide) .... Take 1 Tablet By Mouth Once A Day 5)  Spiriva Handihaler 18 Mcg Caps (Tiotropium Bromide Monohydrate) .... Inhale Contents of 1 Capsule Once A Day 6)  Crestor 20 Mg  Tabs (Rosuvastatin Calcium) .... Take 1 Tablet By Mouth Once A Day 7)  Advair Diskus 250-50 Mcg/dose Misc (Fluticasone-Salmeterol) .Marland Kitchen.. 1 Puff Twice Daily 8)  Glucophage Xr 500 Mg Xr24h-Tab (Metformin Hcl) .... 2 Pills Twice A Day 9)  Cymbalta 30 Mg Cpep (Duloxetine Hcl) .... Take 3  Tables By Mouth Once Daily 10)  Aspir-Low 81 Mg Tbec (Aspirin) ....  Take 1 Tablet By Mouth Once A Day 11)  Glipizide Xl 10 Mg  Tb24 (Glipizide) .... Take 1 Tablet By Mouth Twice A Day 12)  Hydrocodone-Ibuprofen 7.5-200 Mg Tabs (Hydrocodone-Ibuprofen) .... Take One Tab By Mouth Once Every 6 Hours As Needed For Pain 13)  Lantus 100 Unit/ml Soln (Insulin Glargine) .... Inject 35 Units Subcutaneously Daily and Increase Until Fasting Blood Sugars Are Mostly Less Than 130 Mg/dl 14)  Onetouch Ultra Test  Strp (Glucose Blood) .... Use To Test Blood Sugars Three Times A Day 15)  Insulin Syringe 31g X 5/16" 0.3 Ml Misc (Insulin Syringe-Needle U-100) .... Use To Inject Lantus Insulin Once Daily 16)  Lisinopril 5 Mg Tabs (Lisinopril) .... Take 1 Tablet By Mouth Once A Day 17)  Mucinex 600 Mg Xr12h-Tab (Guaifenesin) .... Take 1 Tablet Twice Daily  For 10-14 Days 18)  Nystatin 100000 Unit/gm Crea (Nystatin) .... Apply To The Affected Area 3-4 Times Daily As Needed 19)  Loratadine 10 Mg Tabs (Loratadine) .... Take 1 Tablet By Mouth Once A Day  Allergies (verified): 1)  ! Ultram 2)  ! Morphine  Past History:  Past Medical History: Last updated: 03/02/2007 COPD Depression Diabetes mellitus, type I Osteoporosis Anxiety Low back pain Chronic diffuse pain involving the back, arms, and legs  Family History: Last updated: 11/30/2008 No significant family medical history  Social History: Last updated: 12/07/2007 Lives alone.  No EtOH.  No illegal drugs.  Poor relationship with children.    Risk Factors: Alcohol Use: 0 (06/20/2009) Exercise: no (03/26/2009)  Risk Factors: Smoking Status: current (06/20/2009) Packs/Day: 1.5 (06/20/2009)  Family History: Reviewed history from 11/30/2008 and no changes required. No significant family medical history  Social History: Reviewed history from 12/07/2007 and no changes required. Lives alone.  No EtOH.  No illegal drugs.  Poor relationship with children.    Review of Systems       per HPI   Physical  Exam  General:  Well-developed,well-nourished,in no acute distress; alert,appropriate and cooperative throughout examination Lungs:  normal respiratory effort, no intercostal retractions, no accessory muscle use, normal breath sounds, no crackles, and no wheezes.   Heart:  normal rate, regular rhythm, no murmur, no gallop, and no rub.   Abdomen:  soft, non-tender, normal bowel sounds, and no distention.   Neurologic:  alert & oriented X3, strength normal in all extremities, and gait normal.     Impression & Recommendations:  Problem # 1:  HYPERTENSION (ICD-401.9) Good control, will cont the same regimen.  Her updated medication list for this problem includes:    Hydrochlorothiazide 25 Mg Tabs (Hydrochlorothiazide) .Marland Kitchen... Take 1 tablet by mouth once a day    Lisinopril 5 Mg Tabs (Lisinopril) .Marland Kitchen... Take 1 tablet by mouth once a day  BP today: 128/67 Prior BP: 101/62 (03/26/2009)  Labs Reviewed: K+: 4.4 (03/22/2009) Creat: : 0.85 (03/22/2009)   Chol: 111 (03/22/2009)   HDL: 34 (03/22/2009)   LDL: 45 (03/22/2009)   TG: 159 (03/22/2009)  Problem # 2:  DYSLIPIDEMIA (ICD-272.4) LDL at goal, cont the same regimen.  Her updated medication list for this problem includes:    Crestor 20 Mg Tabs (Rosuvastatin calcium) .Marland Kitchen... Take 1 tablet by mouth once a day  Labs Reviewed: SGOT: 13 (03/22/2009)   SGPT: 15 (03/22/2009)   HDL:34 (03/22/2009), 38 (11/15/2008)  LDL:45 (03/22/2009), 46 (11/15/2008)  Chol:111 (03/22/2009), 108 (11/15/2008)  Trig:159 (03/22/2009), 122 (11/15/2008)  Problem # 3:  HYPERTRIGLYCERIDEMIA (ICD-272.1) On high end of normal, no additional regiemn recommended.  Her updated medication list for this problem includes:    Crestor 20 Mg Tabs (Rosuvastatin calcium) .Marland Kitchen... Take 1 tablet by mouth once a day  Labs Reviewed: SGOT: 13 (03/22/2009)   SGPT: 15 (03/22/2009)   HDL:34 (03/22/2009), 38 (11/15/2008)  LDL:45 (03/22/2009), 46 (11/15/2008)  Chol:111 (03/22/2009), 108  (11/15/2008)  Trig:159 (03/22/2009), 122 (11/15/2008)  Problem # 4:  ANEMIA (ICD-285.9) At baseline and WNL, so will follow up and reasess periodically.  Hgb: 14.0 (04/18/2008)   Hct: 42.3 (04/18/2008)   Platelets: 204 (04/18/2008) RBC: 4.45 (04/18/2008)   RDW: 13.3 (04/18/2008)   WBC: 9.2 (04/18/2008) MCV: 95.1 (04/18/2008)   MCHC: 33.1 (04/18/2008) B12: 330 (04/18/2008)   TSH: 0.997 (04/18/2008)  Problem # 5:  COPD (ICD-496) Cont the same regimen. Her updated medication list for this problem includes:  Albuterol 90 Mcg/act Aers (Albuterol) .Marland Kitchen..Marland Kitchen Two puffs every four hours as needed for shortness of breath    Spiriva Handihaler 18 Mcg Caps (Tiotropium bromide monohydrate) ..... Inhale contents of 1 capsule once a day    Advair Diskus 250-50 Mcg/dose Misc (Fluticasone-salmeterol) .Marland Kitchen... 1 puff twice daily  Pulmonary Functions Reviewed: O2 sat: 100 (03/02/2007)     Vaccines Reviewed: Flu Vax: Historical (10/23/2008)  Problem # 6:  AODM (ICD-250.00) Last A1C at goal. Will assess today. Cont with lantus at 34 units. Her review of monitor reveals sugars in 80's to 200's with one high but not significant since outlier and was repeated soon after and was 190's.  Her updated medication list for this problem includes:    Glucophage Xr 500 Mg Xr24h-tab (Metformin hcl) .Marland Kitchen... 2 pills twice a day    Glipizide Xl 10 Mg Tb24 (Glipizide) .Marland Kitchen... Take 1 tablet by mouth twice a day    Lantus 100 Unit/ml Soln ..... Inject 35 units subcutaneously daily and increase until fasting blood sugars are mostly less than 130 mg/dl   Orders: T-Hgb A1C (in-house) ([id number])  Problem # 7:  DEPRESSION (ICD-311) Will try bupropion since she is smoker as well and will follow up.  Her updated medication list for this problem includes:    Alprazolam 0.5 Mg Tabs (Alprazolam) ..... One pill daily as needed for anxiety    Cymbalta 30 Mg Cpep (Duloxetine hcl) .Marland Kitchen... Take 3  tables by mouth once daily    Wellbutrin Xl 150  Mg Xr24h-tab (Bupropion hcl) .Marland Kitchen... Take 1 tablet once daily  Discussed treatment options, including trial of antidpressant medication. Will refer to behavioral health. Follow-up call in in 24-48 hours and recheck in 2 weeks, sooner as needed. Patient agrees to call if any worsening of symptoms or thoughts of doing harm arise. Verified that the patient has no suicidal ideation at this time.   Complete Medication List: 1)  Prilosec Otc 20 Mg Tbec (Omeprazole magnesium) .... Take two tablets by mouth daily 2)  Alprazolam 0.5 Mg Tabs (Alprazolam) .... One pill daily as needed for anxiety 3)  Albuterol 90 Mcg/act Aers (Albuterol) .... Two puffs every four hours as needed for shortness of breath 4)  Hydrochlorothiazide 25 Mg Tabs (Hydrochlorothiazide) .... Take 1 tablet by mouth once a day 5)  Spiriva Handihaler 18 Mcg Caps (Tiotropium bromide monohydrate) .... Inhale contents of 1 capsule once a day 6)  Crestor 20 Mg Tabs (Rosuvastatin calcium) .... Take 1 tablet by mouth once a day 7)  Advair Diskus 250-50 Mcg/dose Misc (Fluticasone-salmeterol) .Marland Kitchen.. 1 puff twice daily 8)  Glucophage Xr 500 Mg Xr24h-tab (Metformin hcl) .... 2 pills twice a day 9)  Cymbalta 30 Mg Cpep (Duloxetine hcl) .... Take 3  tables by mouth once daily 10)  Aspir-low 81 Mg Tbec (Aspirin) .... Take 1 tablet by mouth once a day 11)  Glipizide Xl 10 Mg Tb24 (Glipizide) .... Take 1 tablet by mouth twice a day 12)  Hydrocodone-ibuprofen 7.5-200 Mg Tabs (Hydrocodone-ibuprofen) .... Take one tab by mouth once every 6 hours as needed for pain 13)  Lantus 100 Unit/ml Soln (Insulin glargine) .... Inject 34 units subcutaneously daily and increase until fasting blood sugars are mostly less than 130 mg/dl 14)  Onetouch Ultra Test Strp (Glucose blood) .... Use to test blood sugars three times a day 15)  Insulin Syringe 31g X 5/16" 0.3 Ml Misc (Insulin syringe-needle u-100) .... Use to inject lantus insulin once daily 16)  Lisinopril 5 Mg  Tabs  (Lisinopril) .... Take 1 tablet by mouth once a day 17)  Mucinex 600 Mg Xr12h-tab (Guaifenesin) .... Take 1 tablet twice daily for 10-14 days 18)  Nystatin 100000 Unit/gm Crea (Nystatin) .... Apply to the affected area 3-4 times daily as needed 19)  Loratadine 10 Mg Tabs (Loratadine) .... Take 1 tablet by mouth once a day 20)  Wellbutrin Xl 150 Mg Xr24h-tab (Bupropion hcl) .... Take 1 tablet once daily  Other Orders: Capillary Blood Glucose/CBG ([id number])  Patient Instructions: 1)  Please schedule a follow-up appointment in 3 months. 2)  Please check your blood pressure regularly, if it is >170 please call clinic at [phone] 3)  Please check your sugar levels regularly and remember to bring the meter with you to the next clinic appointment, if the sugars are > 350 or < 60 please call us at [phone] 4)  Cigarette smoking is estimated to be responsible for over five million premature deaths worldwide, making it the leading preventable cause of death. The most important causes of smoking-related mortality include atherosclerotic cardiovascular disease, lung cancer, and chronic obstructive pulmonary disease (COPD).  Prescriptions: WELLBUTRIN XL 150 MG XR24H-TAB (BUPROPION HCL) take 1 tablet once daily  #30 x 3   Entered and Authorized by:   Trinidad Curet MD   Signed by:   Trinidad Curet MD on 06/20/2009   Method used:   Print then Give to Patient   RxID:   [id number]    Vital Signs:  Patient profile:   60 year old female Height:      64 inches Weight:      171.01 pounds BMI:     29.46 Temp:     97.9 degrees F oral Pulse rate:   92 / minute BP sitting:   128 / 67  (left arm)  Vitals Entered By: Sander Nephew RN (Jun 20, 2009 1:46 PM)   Prevention & Chronic Care Immunizations   Influenza vaccine: Historical  (10/23/2008)   Influenza vaccine deferral: Not indicated  (06/20/2009)   Influenza vaccine due: 11/27/2006    Tetanus booster: 10/19/2006: Td   Td booster deferral: Not  indicated  (03/22/2009)   Tetanus booster due: 10/18/2016    Pneumococcal vaccine: Not documented    H. zoster vaccine: Not documented   H. zoster vaccine deferral: Deferred  (06/20/2009)  Colorectal Screening   Hemoccult: Not documented   Hemoccult action/deferral: Refused  (06/20/2009)    Colonoscopy: normal  (08/18/2005)   Colonoscopy action/deferral: Not indicated  (03/22/2009)   Colonoscopy due: 08/19/2015  Other Screening   Pap smear: NEGATIVE FOR INTRAEPITHELIAL LESIONS OR MALIGNANCY.  (12/01/2008)   Pap smear action/deferral: GYN Referral  (11/15/2008)   Pap smear due: 12/2011    Mammogram: Assessment: BIRADS 1.   (08/03/2007)   Mammogram action/deferral: Not indicated  (03/22/2009)   Mammogram due: 08/02/2008    DXA bone density scan: Not documented   Smoking status: current  (06/20/2009)   Smoking cessation counseling: yes  (06/20/2009)  Diabetes Mellitus   HgbA1C: 6.1  (03/22/2009)   HgbA1C action/deferral: Ordered  (06/20/2009)   Hemoglobin A1C due: 08/26/2006    Eye exam: No diabetic retinopathy.     (09/09/2007)   Diabetic eye exam action/deferral: Refused  (06/20/2009)   Eye exam due: 09/2008    Foot exam: yes  (11/30/2008)   High risk foot: Not documented   Foot care education: Not documented    Urine microalbumin/creatinine ratio: 39.3  (11/15/2008)   Urine microalbumin action/deferral: Ordered  Urine microalbumin/cr due: 08/26/2006    Diabetes flowsheet reviewed?: Yes   Progress toward A1C goal: At goal  Lipids   Total Cholesterol: 111  (03/22/2009)   Lipid panel action/deferral: Lipid Panel ordered   LDL: 45  (03/22/2009)   LDL Direct: Not documented   HDL: 34  (03/22/2009)   Triglycerides: 159  (03/22/2009)   Lipid panel due: 11/27/2006    SGOT (AST): 13  (03/22/2009)   SGPT (ALT): 15  (03/22/2009)   Alkaline phosphatase: 44  (03/22/2009)   Total bilirubin: 0.4  (03/22/2009)    Lipid flowsheet reviewed?: Yes   Progress toward  LDL goal: At goal  Hypertension   Last Blood Pressure: 128 / 67  (06/20/2009)   Serum creatinine: 0.85  (03/22/2009)   Serum potassium 4.4  (03/22/2009)    Hypertension flowsheet reviewed?: Yes   Progress toward BP goal: At goal  Self-Management Support :   Personal Goals (by the next clinic visit) :     Personal A1C goal: 7  (03/22/2009)     Personal blood pressure goal: 130/80  (03/22/2009)     Personal LDL goal: 100  (03/22/2009)    Patient will work on the following items until the next clinic visit to reach self-care goals:     Medications and monitoring: take my medicines every day, check my blood sugar, bring all of my medications to every visit, examine my feet every day  (06/20/2009)     Eating: drink diet soda or water instead of juice or soda, eat more vegetables, use fresh or frozen vegetables, eat foods that are low in salt, eat baked foods instead of fried foods, eat fruit for snacks and desserts, limit or avoid alcohol  (06/20/2009)     Activity: take a 30 minute walk every day, take the stairs instead of the elevator  (06/20/2009)    Diabetes self-management support: Education handout, Pre-printed educational material, Written self-care plan  (06/20/2009)   Diabetes care plan printed   Diabetes education handout printed   Last diabetes self-management training by diabetes educator: 05/01/2008    Hypertension self-management support: Education handout, Pre-printed educational material, Written self-care plan  (06/20/2009)   Hypertension self-care plan printed.   Hypertension education handout printed    Lipid self-management support: Education handout, Pre-printed educational material, Written self-care plan  (06/20/2009)   Lipid self-care plan printed.   Lipid education handout printed   Nursing Instructions: HgbA1C today (see order)      Appended Document: Lab Order    Lab Visit  Laboratory Results   Blood Tests   Date/Time Received: Jun 20, 2009  3:20 PM Date/Time Reported: Lenoria Farrier  Jun 20, 2009 3:21 PM   HGBA1C: 6.0%   (Normal Range: Non-Diabetic - 3-6%   Control Diabetic - 6-8%)    Orders Today:

## 2010-03-12 NOTE — Progress Notes (Signed)
Summary: Soc. Work  Dealer placed by: Patient  Call placed to: Social Work Summary of Call: Patient said she is needing transportation to her medical appmts.  She has an appmt on Mar. 9th that she doesn't want to miss. I will start a [employer] application for [name].  In the meantime I have given her the phone number for [employer] volunteer driver program for her March 9th appmt.   Patient again wants names and number of therapists she can go to for counseling and I will mail these once again to her home.       Appended Document: Soc. Work Geneticist, molecular to [employer])

## 2010-03-12 NOTE — Miscellaneous (Signed)
Summary: adding new med to list  Clinical Lists Changes  Prescriptions: LORATADINE 10 MG TABS (LORATADINE) Take 1 tablet by mouth once a day  #30 x 3   Entered and Authorized by:   Trinidad Curet MD   Signed by:   Trinidad Curet MD on 05/11/2009   Method used:   Print then Give to Patient   RxID:   [id number]    Impression & Recommendations:  Problem # 1:  OSTEOPOROSIS (ICD-733.00) Will discuss with patient if she wants to have Bone Scan study done.

## 2010-03-12 NOTE — Assessment & Plan Note (Signed)
Summary: RA/ROUTINE CK/VS   Vital Signs:  Patient Profile:   60 Years Old Female Height:     64 inches (162.56 cm) Weight:      184.6 pounds (83.91 kg) BMI:     31.80 Temp:     97.2 degrees F (36.22 degrees C) oral Pulse rate:   99 / minute BP sitting:   114 / 63  (right arm)  Vitals Entered By: Nadine Counts [name]) (October 27, 2006 2:55 PM)             Is Patient Diabetic? Yes  Nutritional Status BMI of > 30 = obese  Does patient need assistance? Functional Status Self care Ambulation Normal     PCP:  Lucy Chris MD  Chief Complaint:  discuss meds, rx for diabetic testing supplies, and .  History of Present Illness: 60 yo woman with an extensive PMH as noted who presents today to follow up on depression, fibromyalgia, osteoporosis, and diabetes.  She reports an increase in her depressive symptoms including crying spells,  irritability, and anhedonia.  She is also reporting persistent anxiety and requests a refill on her Alprazolam.  In regards to her fibromyalgia, she says that she is taking Vicodin "regularly" and still having pain.  She also "found" some calcium and wants to know if she should take it.       Current Allergies (reviewed today): ! ULTRAM ! MORPHINE    Risk Factors:  Tobacco use:  current    Cigarettes:  Yes -- 1.5 pack(s) per day    Counseled to quit/cut down tobacco use:  yes Alcohol use:  no Exercise:  no Seatbelt use:  100 %    Physical Exam  General:     alert and well-developed.   Lungs:     normal respiratory effort, no accessory muscle use, and normal breath sounds.   Heart:     normal rate, regular rhythm, and no murmur.   Abdomen:     soft, non-tender, normal bowel sounds, no distention, no hepatomegaly, and no splenomegaly.      Impression & Recommendations:  Problem # 1:  FIBROMYALGIA (ICD-729.1) Will increase Cymbalta dose to 59m daily and follow-up in one month.  Encouraged pt to limit Vicodin and Xanax  prescribed by Dr. TDerrel Nip  Will consider addiding Lyrica at next visit.  Pt was given information on Lyrica to review before her next visit.     Her updated medication list for this problem includes:    Aspir-low 81 Mg Tbec (Aspirin)    Skelaxin 800 Mg Tabs (Metaxalone) ..... One tablet every 8 hours as needed for muscle cramps   Problem # 2:  OSTEOPOROSIS (ICD-733.00) Patient has agreed to try Fosamax.  If she develops significant symptoms from Fosamax, will pursue yearly bisphosphonate injections.  She was encouraged to resume calcium supplementation.     Her updated medication list for this problem includes:    Fosamax 70 Mg Tabs (Alendronate sodium) ..Marland Kitchen.. Take one tablet weekly   Problem # 3:  AODM (ICD-250.00) Good control.  Will continue current medications for now although I am not enthusiastic about using Januvia with no patient oriented outcomes when glipizide dose has not been maximized.  Will address as the pt and I establish a relationship.  Also need to check FLP, microalbumin, and CMET.   Her updated medication list for this problem includes:    Januvia 100 Mg Tabs (Sitagliptin phosphate) ..Marland Kitchen.. Take 1 tablet by mouth once a day  Glucophage Xr 500 Mg Tb24 (Metformin hcl) .Marland Kitchen... 2 pills in am, 2 pills in pm    Aspir-low 81 Mg Tbec (Aspirin)    Glipizide 5 Mg Tabs (Glipizide) .Marland Kitchen... 1/2 pill twice daily with meals   Complete Medication List: 1)  Prilosec 40 Mg Cpdr (Omeprazole) .... Take 1 capsule by mouth once a day 2)  Alprazolam 0.5 Mg Tabs (Alprazolam) .... One pill daily as needed for anxiety 3)  Albuterol 90 Mcg/act Aers (Albuterol) .... P.r.n wheezing 4)  Hydrochlorothiazide 25 Mg Tabs (Hydrochlorothiazide) .... Take 1 tablet by mouth once a day 5)  Spiriva Handihaler 18 Mcg Caps (Tiotropium bromide monohydrate) .... Inhale contents of 1 capsule once a day 6)  Lipitor 20 Mg Tabs (Atorvastatin calcium) .... Take 1 tablet by mouth once a day 7)  Fosamax 70 Mg Tabs  (Alendronate sodium) .... Take one tablet weekly 8)  Januvia 100 Mg Tabs (Sitagliptin phosphate) .... Take 1 tablet by mouth once a day 9)  Advair Diskus 250-50 Mcg/dose Misc (Fluticasone-salmeterol) .Marland Kitchen.. 1 puff twice daily 10)  Glucophage Xr 500 Mg Tb24 (Metformin hcl) .... 2 pills in am, 2 pills in pm 11)  Cymbalta 30 Mg Cpep (Duloxetine hcl) .... Take 3  tables by mouth once daily 12)  Claritin 10 Mg Tabs (Loratadine) .... Once daily tab 13)  Aspir-low 81 Mg Tbec (Aspirin) 14)  Skelaxin 800 Mg Tabs (Metaxalone) .... One tablet every 8 hours as needed for muscle cramps 15)  Glipizide 5 Mg Tabs (Glipizide) .... 1/2 pill twice daily with meals 16)  Augmentin 875-125 Mg Tabs (Amoxicillin-pot clavulanate) .... Take 1 tablet by mouth twice daily for 10 days.   Patient Instructions: 1)  Please schedule a follow-up appointment in 1 month.    ]

## 2010-03-12 NOTE — Assessment & Plan Note (Signed)
Summary: (ACUTE-KIRK)ADD ON PER DUGUAY FOR BITE/CH   Vital Signs:  Patient Profile:   60 Years Old Female Height:     64 inches (162.56 cm) Weight:      184.4 pounds (83.82 kg) BMI:     31.77 Temp:     96.7 degrees F oral Pulse rate:   99 / minute BP sitting:   126 / 79  (right arm)  Pt. in pain?   yes    Location:   right arm/left middle finger    Intensity:   6  Vitals Entered By: Nadine Counts [name]) (October 19, 2006 2:35 PM)              Is Patient Diabetic? Yes  Nutritional Status BMI of > 30 = obese  Does patient need assistance? Functional Status Self care Ambulation Normal     PCP:  Lucy Chris MD  Chief Complaint:  cat bite.  History of Present Illness: This 60 year old Caucasian woman with a PMH significant for DM2, HTN, HLPD, COPD with ongoing tobacco abuse, and depression/anxiety presented to clinic today c/o a cat bite.  She reports that she was attempting to give a flea treatment to one of her two cats yesterday when the cat suddenly had a "spasm" and bit the pt's right forearm.  In a reflexive attempt to free her arm, the pt grasped at the cat's face with her right hand, leading to another bite on the right 3rd digit.  She denies fevers, chills, N/V/D, chest pain, abdominal pain, but reports occasional chronic hot flashes.  Current Allergies (reviewed today): ! ULTRAM ! MORPHINE  Past Medical History:    Reviewed history from 11/19/2005 and no changes required:       COPD       Depression       Diabetes mellitus, type I       Osteoporosis       Anxiety       Low back pain    Risk Factors:  Tobacco use:  current    Cigarettes:  Yes -- 1.5 pack(s) per day    Counseled to quit/cut down tobacco use:  yes Alcohol use:  no Exercise:  no Seatbelt use:  100 %   Review of Systems  The patient denies anorexia, fever, weight loss, chest pain, syncope, hemoptysis, abdominal pain, melena, hematochezia, muscle weakness, and unusual weight  change.     Physical Exam  General:     Alert, well-developed Caucasian woman in no acute distress. Head:     Normocephalic and atraumatic. Eyes:     PERRLA.  EOMI.  Wears corrective lenses.  No scleral icterus or nystagmus. Mouth:     Poor dentition.  Pharynx pink and moist. Neck:     Supple with full ROM.  No masses or thyromegaly. Lungs:     Clear to auscultation bilaterally.  Scattered rhonchorous breath sounds in the anterior chest, c/w chronic smoking. Heart:     Regular rate and rhythm without murmurs, rubs, or gallops. Neurologic:     Alert and oriented x 3.  CN II-XII grossly intact. Skin:     Unremarkable except for 2 lesions - one on the left forearm and one on the right 3rd digits.  Right arm lesion shows several small puncture marks with surrounding erythema, but no exudate.  Left 3rd digit lesion is papular/raised, erythematous, and has a central puncture mark without exudate. Both lesions are tender to palpation. Cervical Nodes:  There is no cervical, supraclavicular, olecranon, or axillary adenopathy.    Impression & Recommendations:  Problem # 1:  CAT BITE (ICD-E906.3) The pt reports a true cat bite (distinct from a cat scratch).  Prophylactic therapy to help prevent infection of these wounds is with amoxicillin/clavulanate.  Per UpToDate, a small trial by Lars Pinks and [place] in 1989 showed a reduction in the rate of infection (vs. placebo) from 9 to 24 hours post-bite.  As this pt has wounds that are somewhat deep-appearing (i.e., not a superficial scratch), we will prescribe amox/clav for prophylaxis today. While 5-10 days of therapy suffice for a cellulitis, I chose the conservative route and gave her a 10-day supply.  She was given a tetanus booster today.  My only lingering concern would be for rabies prophylaxis, as her cats have not had shots for >10 years.  She is convinced they are healthy, and was not interested in discussing rabies prophylaxis with  immune globulin.  The pt was instructed to take the antibiotics as prescribed.  I have made a follow-up appointment for her to be seen here on Friday.  She is to call and cancel that appointment if her pain has resolved and the redness/swelling have gone down.  If the pain or redness of these wounds persist, she is to return Friday to discuss incision and drainage with local debridement.  Complete Medication List: 1)  Prilosec 40 Mg Cpdr (Omeprazole) .... Take 1 capsule by mouth once a day 2)  Alprazolam 0.5 Mg Tabs (Alprazolam) .... One pill daily as needed for anxiety 3)  Albuterol 90 Mcg/act Aers (Albuterol) .... P.r.n wheezing 4)  Hydrochlorothiazide 25 Mg Tabs (Hydrochlorothiazide) .... Take 1 tablet by mouth once a day 5)  Spiriva Handihaler 18 Mcg Caps (Tiotropium bromide monohydrate) .... Inhale contents of 1 capsule once a day 6)  Lipitor 20 Mg Tabs (Atorvastatin calcium) .... Take 1 tablet by mouth once a day 7)  Fosamax 70 Mg Tabs (Alendronate sodium) .... Take one tablet weekly 8)  Januvia 100 Mg Tabs (Sitagliptin phosphate) .... Take 1 tablet by mouth once a day 9)  Advair Diskus 250-50 Mcg/dose Misc (Fluticasone-salmeterol) .Marland Kitchen.. 1 puff twice daily 10)  Glucophage Xr 500 Mg Tb24 (Metformin hcl) .... 2 pills in am, 2 pills in pm 11)  Cymbalta 30 Mg Cpep (Duloxetine hcl) .... Take 2  tables by mouth once daily 12)  Claritin 10 Mg Tabs (Loratadine) .... Once daily tab 13)  Aspir-low 81 Mg Tbec (Aspirin) 14)  Skelaxin 800 Mg Tabs (Metaxalone) .... One tablet every 8 hours as needed for muscle cramps 15)  Glipizide 5 Mg Tabs (Glipizide) .... 1/2 pill twice daily with meals 16)  Augmentin 875-125 Mg Tabs (Amoxicillin-pot clavulanate) .... Take 1 tablet by mouth twice daily for 10 days.   Patient Instructions: 1)  Please schedule a follow-up appointment this Friday.  If you are doing well on the antibiotic, and your finger/arm are healing well with no pain, you can call and cancel. 2)   Tobacco is very bad for your health and your loved ones! You Should stop smoking! 3)  See your eye doctor yearly to check for diabetic eye damage.    Prescriptions: AUGMENTIN 875-125 MG  TABS (AMOXICILLIN-POT CLAVULANATE) Take 1 tablet by mouth twice daily for 10 days.  #20 x 0   Entered and Authorized by:   Dorina Hoyer MD   Signed by:   Dorina Hoyer MD on 10/19/2006   Method used:   Electronically sent  to ...       [facility]       [place], [place]  [address]       Ph:        Fax:    RxID:   [id number]

## 2010-03-12 NOTE — Letter (Signed)
Summary: [facility]: CPAP Machine  [facility]: CPAP Machine   Imported By: Bonner Puna 09/28/2009 15:52:28  _____________________________________________________________________  External Attachment:    Type:   Image     Comment:   External Document

## 2010-03-12 NOTE — Progress Notes (Signed)
Summary: [facility]  Phone Note Refill Request Message from:  Pharmacy on October 01, 2007 9:28 AM  Refills Requested: Medication #1:  TOPAMAX 25 MG  TABS Take 1 tablet by mouth twice a day.   Last Refilled: 08/31/2007  Method Requested: Electronic Initial call taken by: Sander Nephew RN,  October 01, 2007 9:28 AM  Follow-up for Phone Call        Refill approved-nurse to complete Follow-up by: Lucy Chris MD,  October 01, 2007 9:33 AM      Prescriptions: TOPAMAX 25 MG  TABS (TOPIRAMATE) Take 1 tablet by mouth twice a day  #62 x 5   Entered and Authorized by:   Lucy Chris MD   Signed by:   Lucy Chris MD on 10/01/2007   Method used:   Electronically to        [facility]* (retail)       [address]       [place], [place]  [address]       Ph: [phone]       Fax: [phone]   RxID:   [id number]

## 2010-03-12 NOTE — Progress Notes (Signed)
Summary: refill/ hla  Phone Note Refill Request Message from:  Pharmacy on January 07, 2010 5:28 PM  Refills Requested: Medication #1:  PRINZIDE 10-12.5 MG TABS Take 1 tablet by mouth once a day   Dosage confirmed as above?Dosage Confirmed  Medication #2:  NORCO 10-325 MG TABS Take 1 tablet by mouth four times a day as needed pain   Dosage confirmed as above?Dosage Confirmed Initial call taken by: Freddy Finner RN,  January 07, 2010 5:29 PM  Follow-up for Phone Call        Refill approved-nurse to complete Follow-up by: Rhea Pink  DO,  January 08, 2010 11:25 AM    Prescriptions: PRINZIDE 10-12.5 MG TABS (LISINOPRIL-HYDROCHLOROTHIAZIDE) Take 1 tablet by mouth once a day  #30 x 3   Entered and Authorized by:   Rhea Pink  DO   Signed by:   Rhea Pink  DO on 01/08/2010   Method used:   Telephoned to ...       [facility] (mail-order)       [address] [place], [place]  [address]       Ph: [phone]       Fax: [phone]   RxID:   [id number] [place] 10-325 MG TABS (HYDROCODONE-ACETAMINOPHEN) Take 1 tablet by mouth four times a day as needed pain  #120 x 0   Entered and Authorized by:   Rhea Pink  DO   Signed by:   Rhea Pink  DO on 01/08/2010   Method used:   Telephoned to ...       [facility] (mail-order)       [address] [place],   [address]       Ph: [phone]       Fax: [phone]   RxID:   [id number]   Appended Document: refill/ hla Rx called into pharmacy

## 2010-03-12 NOTE — Assessment & Plan Note (Signed)
Summary: ACUTE-DEPRESSION PT TO SEE DR TULLO/PLS PAGE [id number] NOT TO ...   Vital Signs:  Patient Profile:   60 Years Old Female Weight:      182.13 pounds Temp:     97.1 degrees F oral Pulse rate:   86 / minute Resp:     20 per minute BP sitting:   125 / 77  (right arm)  Pt. in pain?   yes    Location:   back    Intensity:   3    Type:       aching  Vitals Entered By: Gerlean Ren RN (February 24, 2006 1:45 PM)              Is Patient Diabetic? Yes  Nutritional Status Normal CBG Result 169  Have you ever been in a relationship where you felt threatened, hurt or afraid?No   Does patient need assistance? Functional Status Self care Ambulation Normal      Chief Complaint:  Depression and Diabetes Type 2.  History of Present Illness: 45 yowf with COPD,        This is a 60 year old female who presents with Diabetes Type 2.  The patient denies polyuria and polydipsia.  Since the last visit the patient admits to dietary compliance on most days.  However, she is not exercising.  she is complying with medications and blood glucose testing.  She is currently taking Januvia 100 mg daily and metformin xr 1500 mg daily and her CBGs are still in the 180 to 220 range fasting.       The patient also complains of depressive symptoms.  The patient complains of depressed mood, loss of interest/pleasure, and hypersomnia.  The patient also complains of diminished concentration and indecisiveness.   The patient also denies thoughts of death, thoughts of suicide, suicidal intent, and suicidal plans.  Risk factors for depression include major life changes.  Significant past history includes depression.  The patient denies symptoms of a manic disorder including abnormally elevated mood, abnormally irritable mood, decreased need for sleep, more talkative than usual , distractibility, flight of ideas, increase in goal-directed activity, and inflated self-esteem/ grandiosity.      Prior  Medications: PRILOSEC 40 MG CPDR (OMEPRAZOLE) Take 1 capsule by mouth once a day XANAX 0.25 MG TABS (ALPRAZOLAM) Take 1 tablet by mouth once a day p.r.n anxiety ALBUTEROL 90 MCG/ACT AERS (ALBUTEROL) p.r.n wheezing HYDROCHLOROTHIAZIDE 25 MG TABS (HYDROCHLOROTHIAZIDE) Take 1 tablet by mouth once a day SPIRIVA HANDIHALER 18 MCG CAPS (TIOTROPIUM BROMIDE MONOHYDRATE) Inhale contents of 1 capsule once a day LIPITOR 20 MG TABS (ATORVASTATIN CALCIUM) Take 1 tablet by mouth once a day FOSAMAX 70 MG TABS (ALENDRONATE SODIUM) weekly but she has not started this JANUVIA 100 MG TABS (SITAGLIPTIN PHOSPHATE) Take 1 tablet by mouth once a day CHANTIX STARTING MONTH PAK 0.5 MG X 11 & 1 MG X 42 MISC (VARENICLINE TARTRATE) one daily x 1 wk, then twice daily ADVAIR DISKUS 250-50 MCG/DOSE MISC (FLUTICASONE-SALMETEROL) 1 puff twice daily Current Allergies (reviewed today): ! ULTRAM ! MORPHINE    Risk Factors:  Tobacco use:  current    Cigarettes:  Yes -- 1 pack(s) per day      Impression & Recommendations:  Problem # 1:  DEPRESSION (ICD-311)  The following medications were removed from the medication list:    Lexapro 20 Mg Tabs (Escitalopram oxalate) .Marland Kitchen... Take 1 tablet by mouth once a day.  Patient has already weaned herself down to a  10 mg dose for the last several weeks.  Instructed her to stop as of today.  Her updated medication list for this problem includes:    Xanax 0.25 Mg Tabs (Alprazolam) .Marland Kitchen... Take 1 tablet by mouth once a day p.r.n anxiety    Cymbalta 30 Mg Cpep (Duloxetine hcl) .Marland Kitchen... Take 1 tablet by mouth once a day x 1 wk, then twice daily.  do not start until Saturday 1/19, to mitigate the risk of serotonin syndrome from prior use of Lexapro.  Pt will return to see me prior to next refill of cymbalta for recheck on symptoms.  Cymbalta was chosen due to patient's intoelrance previously of Wellbuytrin (agitation), comorbidity of fibromyalgia, and pt's preference due to favorable  expereince of a friend who is taking Cymbalta.    The following medications were removed from the medication list:    Lexapro 20 Mg Tabs (Escitalopram oxalate) .Marland Kitchen... Take 1 tablet by mouth once a day  Her updated medication list for this problem includes:    Xanax 0.25 Mg Tabs (Alprazolam) .Marland Kitchen... Take 1 tablet by mouth once a day p.r.n anxiety    Cymbalta 30 Mg Cpep (Duloxetine hcl) .Marland Kitchen... Take 1 tablet by mouth once a day x 1 wk, then twice daily   Problem # 2:  DIABETES MELLITUS, TYPE I (ICD-250.01)  Her updated medication list for this problem includes:    Januvia 100 Mg Tabs (Sitagliptin phosphate) .Marland Kitchen... Take 1 tablet by mouth once a day    Glucophage Xr 500 Mg Tb24 (Metformin hcl) .Marland KitchenMarland KitchenMarland KitchenMarland Kitchen 3 pills in am, 2 pills in pm  patient recently refilled her metformin rx; therefore will supplement current rx with short acting metformin 500 mg two times a day until she finishes the current bottle of XR. Will thereafter increased the XR to 2500 mg daily divided into 1500 mg qam and 1000 mg qpm.  Orders: T-Hgb A1C ([id number])   Medications Added to Medication List This Visit: 1)  Glucophage Xr 500 Mg Tb24 (Metformin hcl) .... 3 pills in am, 2 pills in pm 2)  Cymbalta 30 Mg Cpep (Duloxetine hcl) .... Take 1 tablet by mouth once a day x 1 wk, then twice daily  Other Orders: Fingerstick (58309) Capillary Blood Glucose (40768)  Diabetes Management Assessment/Plan:      The following lipid goals have been established for the patient: Total cholesterol goal of 200; LDL cholesterol goal of 100; HDL cholesterol goal of 40; Triglyceride goal of 200.     Laboratory Results       Blood Tests HGBA1C: 6.4%   (Normal Range: Non-Diabetic - 3-6%   Control Diabetic - 6-8%)   Other Tests

## 2010-03-12 NOTE — Assessment & Plan Note (Signed)
Summary: possible shingles/pcp-kirk/hla   Vital Signs:  Patient Profile:   60 Years Old Female Height:     64 inches (162.56 cm) Weight:      177.03 pounds (80.47 kg) BMI:     30.50 Temp:     97.1 degrees F (36.17 degrees C) oral Pulse rate:   102 / minute BP sitting:   118 / 72  (left arm)  Pt. in pain?   yes    Location:   lower abdomen , back    Intensity:   9    Type:       aching, stinging  Vitals Entered By: Sander Nephew RN (January 18, 2008 9:05 AM)              Is Patient Diabetic? Yes Did you bring your meter with you today? Yes Nutritional Status BMI of > 30 = obese  Have you ever been in a relationship where you felt threatened, hurt or afraid?No   Does patient need assistance? Functional Status Self care Ambulation Normal     PCP:  Lucy Chris MD  Chief Complaint:  ? Shigles.  Refusses to have blood sugr done says that she cannot stand the pain. Has a rash on abdomen and back..  History of Present Illness: 60 y/o female with PMH as outlined in EMR who was recently seen in [place] for right flank pain twice. Please refer to the two notes by Dr. Alfonse Alpers. She now has a rash on the right flank where the pain is, and looked it up and it matched shingles. She was unaware that she had varicella in the past, but her sister reminded her that she did. The rash is on her right side and back, moving to her front in the L5 distribution, and not crossing the midline. She states that it is a burning pain that is exquisitely tender to the touch, with only minimal help from the vicodin, and none from the naproxen. We reviewed her UA results and why she was treated.  Depression History:      The patient denies a depressed mood most of the day but notes a diminished interest in her usual daily activities.        The patient denies that she feels like life is not worth living, denies that she wishes that she were dead, and denies that she has thought about ending her life.          Comments:  Seasonal.  .     Prior Medications Reviewed Using: Patient Recall  Updated Prior Medication List: PRILOSEC OTC 20 MG  TBEC (OMEPRAZOLE MAGNESIUM) Take two tablets by mouth daily ALPRAZOLAM 0.5 MG  TABS (ALPRAZOLAM) one pill daily as needed for anxiety ALBUTEROL 90 MCG/ACT AERS (ALBUTEROL) Two puffs every four hours as needed for shortness of breath HYDROCHLOROTHIAZIDE 25 MG TABS (HYDROCHLOROTHIAZIDE) Take 1 tablet by mouth once a day SPIRIVA HANDIHALER 18 MCG CAPS (TIOTROPIUM BROMIDE MONOHYDRATE) Inhale contents of 1 capsule once a day CRESTOR 20 MG  TABS (ROSUVASTATIN CALCIUM) Take 1 tablet by mouth once a day ADVAIR DISKUS 250-50 MCG/DOSE MISC (FLUTICASONE-SALMETEROL) 1 puff twice daily GLUCOPHAGE XR 500 MG TB24 (METFORMIN HCL) 2 pills in AM, 2 pills in PM CYMBALTA 30 MG CPEP (DULOXETINE HCL) Take 3  tables by mouth once daily ASPIR-LOW 81 MG TBEC (ASPIRIN)  GLIPIZIDE XL 10 MG  TB24 (GLIPIZIDE) Take 2 tablet by mouth once a day VICODIN 5-500 MG  TABS (HYDROCODONE-ACETAMINOPHEN) Take one tablet by mouth  every eight hours as needed for pain LORATADINE 10 MG  TABS (LORATADINE) Take 1 tablet by mouth once a day NYSTATIN 100000 UNIT/GM  CREA (NYSTATIN) Apply to affected area two times a day. FERROUS SULFATE 325 (65 FE) MG  TBEC (FERROUS SULFATE) Take 1 tablet by mouth three times a day TOPAMAX 25 MG  TABS (TOPIRAMATE) Take 1 tablet by mouth twice a day  Current Allergies: ! ULTRAM ! MORPHINE    Risk Factors:  Tobacco use:  current    Year started:  AT THE AGE OF 12    Cigarettes:  Yes -- 1.5 pack(s) per day    Counseled to quit/cut down tobacco use:  yes Alcohol use:  no Exercise:  no Seatbelt use:  100 %  Colonoscopy History:    Date of Last Colonoscopy:  08/18/2005  Mammogram History:    Date of Last Mammogram:  08/03/2007  PAP Smear History:    Date of Last PAP Smear:  06/29/2007   Review of Systems      See HPI   Physical Exam  General:      Mild distress due to pain, A&Ox3 Abdomen:     soft, non-tender, normal bowel sounds, and no distention.   Neurologic:     alert & oriented X3.   Skin:     She has a macular rash in the right L5 dermatomal distribution, none crusting over or weeping, the largest being nearly 1 inch in diameter. They go from her right lower back and wrap around her side to the front, not crossing the midline. The area is extremely tender. Psych:     Anxious and talkative, but appropriate.    Impression & Recommendations:  Problem # 1:  SHINGLES (ICD-053.9) With current rash it is likely that Ms. Witherow has herpes zoster and the pain preceded the lesions. I will treat with acyclovir 868m by mouth every 4 hours for 10 days. For her pain I will provide hydrocodone-ibuprofen as she no longer wants vicodin. WIll have her follow up in 2 weeks.  Complete Medication List: 1)  Prilosec Otc 20 Mg Tbec (Omeprazole magnesium) .... Take two tablets by mouth daily 2)  Alprazolam 0.5 Mg Tabs (Alprazolam) .... One pill daily as needed for anxiety 3)  Albuterol 90 Mcg/act Aers (Albuterol) .... Two puffs every four hours as needed for shortness of breath 4)  Hydrochlorothiazide 25 Mg Tabs (Hydrochlorothiazide) .... Take 1 tablet by mouth once a day 5)  Spiriva Handihaler 18 Mcg Caps (Tiotropium bromide monohydrate) .... Inhale contents of 1 capsule once a day 6)  Crestor 20 Mg Tabs (Rosuvastatin calcium) .... Take 1 tablet by mouth once a day 7)  Advair Diskus 250-50 Mcg/dose Misc (Fluticasone-salmeterol) ..Marland Kitchen. 1 puff twice daily 8)  Glucophage Xr 500 Mg Tb24 (Metformin hcl) .... 2 pills in am, 2 pills in pm 9)  Cymbalta 30 Mg Cpep (Duloxetine hcl) .... Take 3  tables by mouth once daily 10)  Aspir-low 81 Mg Tbec (Aspirin) 11)  Glipizide Xl 10 Mg Tb24 (Glipizide) .... Take 2 tablet by mouth once a day 12)  Hydrocodone-ibuprofen 7.5-200 Mg Tabs (Hydrocodone-ibuprofen) .... Take one tab by mouth once every 6 hours as needed  for pain 13)  Loratadine 10 Mg Tabs (Loratadine) .... Take 1 tablet by mouth once a day 14)  Nystatin 100000 Unit/gm Crea (Nystatin) .... Apply to affected area two times a day. 15)  Ferrous Sulfate 325 (65 Fe) Mg Tbec (Ferrous sulfate) .... Take 1 tablet by  mouth three times a day 16)  Topamax 25 Mg Tabs (Topiramate) .... Take 1 tablet by mouth twice a day 17)  Acyclovir 800 Mg Tabs (Acyclovir) .... Take one tab by mouth every 4 hours (five times a day) for 10 days   Patient Instructions: 1)  Please return to the clinic in 2 weeks to follow up on your case of shingles. Please avoid contact with people who have never had the chicken pox until the rash has completely crusted over.   Prescriptions: HYDROCODONE-IBUPROFEN 7.5-200 MG TABS (HYDROCODONE-IBUPROFEN) Take one tab by mouth once every 6 hours as needed for pain  #90 x 2   Entered and Authorized by:   Jacolyn Reedy MD   Signed by:   Jacolyn Reedy MD on 01/18/2008   Method used:   Print then Give to Patient   RxID:   [id number] ACYCLOVIR 800 MG TABS (ACYCLOVIR) Take one tab by mouth every 4 hours (five times a day) for 10 days  #50 x 0   Entered and Authorized by:   Jacolyn Reedy MD   Signed by:   Jacolyn Reedy MD on 01/18/2008   Method used:   Print then Give to Patient   RxID:   [id number]  ]

## 2010-03-12 NOTE — Progress Notes (Signed)
Summary: appt today/ hla  Phone Note Call from Patient   Summary of Call: pt calls and states she is burning and urine smells and looks funny. toothache also. has dental clinic appt for total extraction in dec Initial call taken by: Freddy Finner RN,  November 15, 2008 8:49 AM

## 2010-03-12 NOTE — Assessment & Plan Note (Signed)
Summary: diabetes/dmr  Is Patient Diabetic? Yes Did you bring your meter with you today? Yes   Allergies: 1)  ! Ultram 2)  ! Morphine   Complete Medication List: 1)  Prilosec Otc 20 Mg Tbec (Omeprazole magnesium) .... Take two tablets by mouth daily 2)  Alprazolam 0.5 Mg Tabs (Alprazolam) .... One pill daily as needed for anxiety 3)  Albuterol 90 Mcg/act Aers (Albuterol) .... Two puffs every four hours as needed for shortness of breath 4)  Hydrochlorothiazide 25 Mg Tabs (Hydrochlorothiazide) .... Take 1 tablet by mouth once a day 5)  Spiriva Handihaler 18 Mcg Caps (Tiotropium bromide monohydrate) .... Inhale contents of 1 capsule once a day 6)  Crestor 20 Mg Tabs (Rosuvastatin calcium) .... Take 1 tablet by mouth once a day 7)  Advair Diskus 250-50 Mcg/dose Misc (Fluticasone-salmeterol) .Marland Kitchen.. 1 puff twice daily 8)  Glucophage Xr 500 Mg Xr24h-tab (Metformin hcl) .... 2 pills twice a day 9)  Cymbalta 30 Mg Cpep (Duloxetine hcl) .... Take 3  tables by mouth once daily 10)  Aspir-low 81 Mg Tbec (Aspirin) .... Take 1 tablet by mouth once a day 11)  Glipizide Xl 10 Mg Tb24 (Glipizide) .... Take 1 tablet by mouth twice a day 12)  Hydrocodone-ibuprofen 7.5-200 Mg Tabs (Hydrocodone-ibuprofen) .... Take one tab by mouth once every 6 hours as needed for pain 13)  Lantus 100 Unit/ml Soln (Insulin glargine) .... Inject 30 units subcutaneously daily and increase until fasting blood sugars are mostly less than 130 mg/dl 14)  Onetouch Ultra Test Strp (Glucose blood) .... Use to test blood sugars three times a day 15)  Insulin Syringe 31g X 5/16" 0.3 Ml Misc (Insulin syringe-needle u-100) .... Use to inject lantus insulin once daily 16)  Lisinopril 5 Mg Tabs (Lisinopril) .... Take 1 tablet by mouth once a day 17)  Mucinex 600 Mg Xr12h-tab (Guaifenesin) .... Take 1 tablet twice daily for 10-14 days 18)  Nystatin 100000 Unit/gm Crea (Nystatin) .... Apply to the affected area 3-4 times daily as needed 19)   Loratadine 10 Mg Tabs (Loratadine) .... Take 1 tablet by mouth once a day  Other Orders: DSMT(Medicare) Individual, 30 Minutes (G0108)  Diabetes Self Management Training  PCP: Trinidad Curet MD Date diagnosed with diabetes: 02/11/2003 Diabetes Type: Type 2 non-insulin treated Other persons present: no Current smoking Status: current  Potential Barriers  Demonstrates competency  Diabetes Medications:  Lipid lowering Meds? No Comments: note bedtime blood sugars are 80-90s since patient increased her insulin to 35 units a day, note seeral 70s at other times durin the day and a few blood sugars in low 200s. Usual bedtime target blood sugar is 110-150. this was discussed with patient and her physican.  suggest decrease of lantus to 34 units a night, monitor bedtime sugars nad further 1 unit decrease if still below target. patient not ready for lifestyle beavjior chages as she is verbalizing symptoms of uncontrolled depression currently.     Monitoring Self monitoring blood glucose 2 times a day Name of Meter  One Touch Ultra 2     Estimated /Usual Carb Intake Breakfast # of Carbs/Grams not typically awake Midmorning # of Carbs/Grams oatmeal with low sugar jelly and peanutbutter nad margarine Bedtime # of Carbs/Grams little debbies  Nutrition assessment What do you look at?  does read labels nad c arb counts at times, not intersted in further disucssion today  Activity Limitations  Inadequate physical activity Diabetes Disease Process  Discussed today Define diabetes in simple terms: Demonstrates competency Medications State insulin adjustment guidelines: Needs review/assistance    Nutritional Management State changes planned for home meals/snacks: Needs review/assistance Monitoring State purpose and frequency of monitoring BG-ketones-HgbA1C  : Demonstrates  competency   Perform glucose monitoring/ketone testing and record results correctly: Demonstrates competency    State target blood glucose and HgbA1C goals: Demonstrates competency    Complications State the causes-signs and symptoms and prevention of Hyperglycemia: Needs review/assistance   Explain proper treatment of hyperglycemia: Needs review/assistance   State the causes- signs and symptoms and prevention of hypoglycemia: Needs review/assistance   Explain proper treatment of hypoglycemia: Demonstrates competency    Exercise States importance of exercise: Demonstrates competency   States effect of exercise on blood glucose: Financial planner safety measures for exercise related to diabetes: Needs review/assistance Lifestyle changes:Goal setting and Problem solving State benefits of making appropriate lifestyle changes: Demonstrates competencyIdentify lifestyle behaviors that need to change: Demonstrates competencyIdentify risk factors that interfere with health: Not applicableDevelop strategies to reduce risk factors: Demonstrates competencyVerbalize need for and frequency of health care follow-up: Demonstrates competencyList at least two appropriate community resources: Demonstrates competencyIdentify Family/SO role in managing diabetes: Demonstrates competency Psychosocial Adjustment State three common feelings that might be experienced when learning to cope with diabetes: Demonstrates competencyIdentify two methods to cope with these feelings: Demonstrates competencyDiabetes Management Education Done: 06/20/2009    BEHAVIORAL GOALS INITIAL Utilizing medications if for therapeutic effectiveness: try to keep bedtime blood sugar 110-150        excellent control of her diabetes. Diabetes Self Management Support: daughter, friend with daibetes and clinic staff Follow-up:biannually and as needed

## 2010-03-12 NOTE — Progress Notes (Signed)
Summary: Refill/gh  Phone Note Refill Request Message from:  Pharmacy on February 07, 2008 12:07 PM  Refills Requested: Medication #1:  SPIRIVA HANDIHALER 18 MCG CAPS Inhale contents of 1 capsule once a day   Last Refilled: 01/08/2008  Method Requested: Electronic Initial call taken by: Sander Nephew RN,  February 07, 2008 12:07 PM  Follow-up for Phone Call        Refill approved-nurse to complete Follow-up by: Lucy Chris MD,  February 09, 2008 12:02 PM      Prescriptions: SPIRIVA HANDIHALER 18 MCG CAPS (TIOTROPIUM BROMIDE MONOHYDRATE) Inhale contents of 1 capsule once a day  #1 x 5   Entered and Authorized by:   Lucy Chris MD   Signed by:   Lucy Chris MD on 02/09/2008   Method used:   Electronically to        [facility] [phone]* (retail)       [address]       [place], [place]  [address]       Ph: [phone]       Fax: [phone]   RxID:   [id number]

## 2010-03-12 NOTE — Progress Notes (Signed)
Summary: diab shoes/ hla  Phone Note Call from Patient   Summary of Call: wants a prescription for diab shoes, wants to use [facility], she will need a prescription for them Initial call taken by: Freddy Finner RN,  Jun 19, 2009 1:35 PM  Follow-up for Phone Call        will give today at the appointment thank you iskra Follow-up by: Trinidad Curet MD,  Jun 20, 2009 1:38 PM

## 2010-03-12 NOTE — Medication Information (Signed)
Summary: [facility]: RX  [facility]: RX   Imported By: Bonner Puna 09/19/2008 13:53:52  _____________________________________________________________________  External Attachment:    Type:   Image     Comment:   External Document

## 2010-03-12 NOTE — Progress Notes (Signed)
Summary: refill/ hla  Phone Note Refill Request Message from:  Patient on October 17, 2008 12:33 PM  Refills Requested: Medication #1:  ALPRAZOLAM 0.5 MG  TABS one pill daily as needed for anxiety Initial call taken by: Freddy Finner RN,  October 17, 2008 12:33 PM  Follow-up for Phone Call        completed, called in perscription Follow-up by: Trinidad Curet MD,  October 17, 2008 10:06 PM    Prescriptions: ALPRAZOLAM 0.5 MG  TABS (ALPRAZOLAM) one pill daily as needed for anxiety  #100 x 3   Entered and Authorized by:   Trinidad Curet MD   Signed by:   Trinidad Curet MD on 10/17/2008   Method used:   Telephoned to ...       [facility] [phone]* (retail)       [address]       [place], [place]  [address]       Ph: [phone]       Fax: [phone]   RxID:   [id number]

## 2010-03-12 NOTE — Progress Notes (Signed)
  Phone Note Call from Patient   Caller: Patient Call For: Described mild symptoms of SOB and fatigue. Reason for Call: Acute Illness Action Taken: Appt Scheduled Summary of Call: Patient call in order to received results info and to document mild SOB and fatigue with exertion. Patient with documented low Hgb during recent visits and lab studies showing Iron deficiency anemia; she had an appoinment on Monday for Hgb check and to determine if transfussion if needed. She will benefit of short stay arrangement and 1-2 units of bloods transfussed depending on her labs results on Monday. Patient advised of plan and appoinment details; she was encourage to continue taking her ferrous sulfate and to call us if she develops any chest pain or worsening of her symptoms before Monday.

## 2010-03-12 NOTE — Assessment & Plan Note (Signed)
Summary: ACUTE-ADD PER [facility]   Vital Signs:  Patient profile:   60 year old female Height:      64 inches (162.56 cm) Weight:      170.0 pounds (77.27 kg) BMI:     29.29 Temp:     97.6 degrees F (36.44 degrees C) oral Pulse rate:   86 / minute BP sitting:   117 / 67  (right arm)  Vitals Entered By: Hilda Blades Ditzler RN (July 25, 2009 2:03 PM) Is Patient Diabetic? No Pain Assessment Patient in pain? yes     Location: low center chest Intensity: 7 Type: bburning Onset of pain  past 2 days Nutritional Status BMI of 25 - 29 = overweight Nutritional Status Detail appetite ok  Have you ever been in a relationship where you felt threatened, hurt or afraid?denies   Does patient need assistance? Functional Status Self care Ambulation Normal Comments Past 2 days more burping and passing gas - pt thinks acid reflux. On generic med - not helping. Usually takes Prilosec 40.   Primary Care Provider:  Trinidad Curet MD   History of Present Illness: [name] comes today to discuss about her GERD. She used to take prilosec 40 mg once a day for years. This year for the past few months there was some delay in prior authorization for prilosec especially after Nat[facility] left [facility]. She was given generic medicine and it was Omeprazole 20 mg 2 pills a day. However this is not working. She has awful heartburn, passing a lot of gas and she is burping a lot. This is going on for at least a couple of days, but she has burping and nausea for about 2 wks. She was diagnosed with GERD for 15-20 yrs. She had 2 endoscopies. Her last endoscopy was in 2002 and she had proximal gastritis, and hiatal hernia. She is here also because she wants to make sure she is not having hear attck, even though she doesnot think this is so. No CP. She has baseline SOB, but no acute worseness. NO diaphoresis, but she has nausea but no vomiting.   She continues to smoke one and half pack per day. She had nausea with bupriorion but  she is taking it now.   Depression History:      The patient denies a depressed mood most of the day and a diminished interest in her usual daily activities.         Preventive Screening-Counseling & Management  Alcohol-Tobacco     Alcohol drinks/day: 0     Smoking Status: current     Smoking Cessation Counseling: yes     Packs/Day: 1.5     Year Started: AT THE AGE OF 12  Caffeine-Diet-Exercise     Does Patient Exercise: no  Current Medications (verified): 1)  Prilosec 40 Mg Cpdr (Omeprazole) .... Take 1 Tablet By Mouth Once A Day 2)  Alprazolam 0.5 Mg  Tabs (Alprazolam) .... One Pill Daily As Needed For Anxiety 3)  Albuterol 90 Mcg/act Aers (Albuterol) .... Two Puffs Every Four Hours As Needed For Shortness of Breath 4)  Hydrochlorothiazide 25 Mg Tabs (Hydrochlorothiazide) .... Take 1 Tablet By Mouth Once A Day 5)  Spiriva Handihaler 18 Mcg Caps (Tiotropium Bromide Monohydrate) .... Inhale Contents of 1 Capsule Once A Day 6)  Crestor 20 Mg  Tabs (Rosuvastatin Calcium) .... Take 1 Tablet By Mouth Once A Day 7)  Advair Diskus 250-50 Mcg/dose Misc (Fluticasone-Salmeterol) .Marland Kitchen.. 1 Puff Twice Daily 8)  Glucophage Xr  500 Mg Xr24h-Tab (Metformin Hcl) .... 2 Pills Twice A Day 9)  Cymbalta 30 Mg Cpep (Duloxetine Hcl) .... Take 3  Tables By Mouth Once Daily 10)  Aspir-Low 81 Mg Tbec (Aspirin) .... Take 1 Tablet By Mouth Once A Day 11)  Glipizide Xl 10 Mg  Tb24 (Glipizide) .... Take 1 Tablet By Mouth Twice A Day 12)  Hydrocodone-Ibuprofen 7.5-200 Mg Tabs (Hydrocodone-Ibuprofen) .... Take One Tab By Mouth Once Every 6 Hours As Needed For Pain 13)  Lantus 100 Unit/ml Soln (Insulin Glargine) .... Inject 34 Units Subcutaneously Daily and Increase Until Fasting Blood Sugars Are Mostly Less Than 130 Mg/dl 14)  Onetouch Ultra Test  Strp (Glucose Blood) .... Use To Test Blood Sugars Three Times A Day 15)  Insulin Syringe 31g X 5/16" 0.3 Ml Misc (Insulin Syringe-Needle U-100) .... Use To Inject Lantus  Insulin Once Daily 16)  Lisinopril 5 Mg Tabs (Lisinopril) .... Take 1 Tablet By Mouth Once A Day 17)  Mucinex 600 Mg Xr12h-Tab (Guaifenesin) .... Take 1 Tablet Twice Daily For 10-14 Days 18)  Nystatin 100000 Unit/gm Crea (Nystatin) .... Apply To The Affected Area 3-4 Times Daily As Needed 19)  Loratadine 10 Mg Tabs (Loratadine) .... Take 1 Tablet By Mouth Once A Day 20)  Wellbutrin Xl 150 Mg Xr24h-Tab (Bupropion Hcl) .... Take 1 Tablet Once Daily 21)  Cvs Diabetic Organizer  Misc (Misc. Devices) .... Please Provide Patient With Diabetic Shoes 22)  Diabetic Shoes With Arch Inserts .... One Pair Dx: 250.00 High Risk Foot  Allergies: 1)  ! Ultram 2)  ! Morphine  Review of Systems      See HPI  Physical Exam  Lungs:  normal breath sounds, no crackles, and no wheezes.   Heart:  normal rate, regular rhythm, no murmur, no gallop, and no rub.   Abdomen:  soft.  THere is moderate tenderness on the epigastrium, without rebound, guarding or rigidity.  Extremities:  trace left pedal edema and trace right pedal edema.   Neurologic:  alert & oriented X3.     Impression & Recommendations:  Problem # 1:  GERD (ICD-530.81) See HPI. Pt has bad acid reflux with a taste of bile constantly in her mouth. SHe has substernal burning sensation, epigastric pain and constant burping and gas, which was constantly present at the exam room. She was on prilosec 40 mg once daily for 15-20 yrs which was controlling her symptoms. For some reason she was getting omeprazole 20 mg 2 pills a day recently and now her symptoms are very uncontrolled. Since her symptoms were controlled with prilosec 40 mg and she failed omeprazole, she needs to take her prilosec back, before trying anything else. Will f/u in a month. If symptoms persist, will refer to GI for EGD. Her symptoms today were so bad that we have to give her GI cocktail and anti-emetics in the clinic.    Her updated medication list for this problem includes:     Prilosec 40 Mg Cpdr (Omeprazole) .Marland Kitchen... Take 1 tablet by mouth once a day  Complete Medication List: 1)  Prilosec 40 Mg Cpdr (Omeprazole) .... Take 1 tablet by mouth once a day 2)  Alprazolam 0.5 Mg Tabs (Alprazolam) .... One pill daily as needed for anxiety 3)  Albuterol 90 Mcg/act Aers (Albuterol) .... Two puffs every four hours as needed for shortness of breath 4)  Hydrochlorothiazide 25 Mg Tabs (Hydrochlorothiazide) .... Take 1 tablet by mouth once a day 5)  Spiriva Handihaler 18 Mcg Caps (  Tiotropium bromide monohydrate) .... Inhale contents of 1 capsule once a day 6)  Crestor 20 Mg Tabs (Rosuvastatin calcium) .... Take 1 tablet by mouth once a day 7)  Advair Diskus 250-50 Mcg/dose Misc (Fluticasone-salmeterol) .Marland Kitchen.. 1 puff twice daily 8)  Glucophage Xr 500 Mg Xr24h-tab (Metformin hcl) .... 2 pills twice a day 9)  Cymbalta 30 Mg Cpep (Duloxetine hcl) .... Take 3  tables by mouth once daily 10)  Aspir-low 81 Mg Tbec (Aspirin) .... Take 1 tablet by mouth once a day 11)  Glipizide Xl 10 Mg Tb24 (Glipizide) .... Take 1 tablet by mouth twice a day 12)  Hydrocodone-ibuprofen 7.5-200 Mg Tabs (Hydrocodone-ibuprofen) .... Take one tab by mouth once every 6 hours as needed for pain 13)  Lantus 100 Unit/ml Soln (Insulin glargine) .... Inject 34 units subcutaneously daily and increase until fasting blood sugars are mostly less than 130 mg/dl 14)  Onetouch Ultra Test Strp (Glucose blood) .... Use to test blood sugars three times a day 15)  Insulin Syringe 31g X 5/16" 0.3 Ml Misc (Insulin syringe-needle u-100) .... Use to inject lantus insulin once daily 16)  Lisinopril 5 Mg Tabs (Lisinopril) .... Take 1 tablet by mouth once a day 17)  Mucinex 600 Mg Xr12h-tab (Guaifenesin) .... Take 1 tablet twice daily for 10-14 days 18)  Nystatin 100000 Unit/gm Crea (Nystatin) .... Apply to the affected area 3-4 times daily as needed 19)  Loratadine 10 Mg Tabs (Loratadine) .... Take 1 tablet by mouth once a day 20)   Wellbutrin Xl 150 Mg Xr24h-tab (Bupropion hcl) .... Take 1 tablet once daily 21)  Cvs Diabetic Organizer Misc (Misc. devices) .... Please provide patient with diabetic shoes 22)  Diabetic Shoes With Arch Inserts  .... One pair dx: 250.00 high risk foot 23)  Promethazine Hcl 12.5 Mg Tabs (Promethazine hcl) .... Take 1 pill by mouth three times a day as needed for nausea and vomiting.  Patient Instructions: 1)  Please schedule a follow-up appointment in 1 month. 2)  Limit your Sodium (Salt) to less than 2 grams a day(slightly less than 1/2 a teaspoon) to prevent fluid retention, swelling, or worsening of symptoms. 3)  Tobacco is very bad for your health and your loved ones! You Should stop smoking!. 4)  Stop Smoking Tips: Choose a Quit date. Cut down before the Quit date. decide what you will do as a substitute when you feel the urge to smoke(gum,toothpick,exercise). 5)  It is important that you exercise regularly at least 20 minutes 5 times a week. If you develop chest pain, have severe difficulty breathing, or feel very tired , stop exercising immediately and seek medical attention. 6)  You need to lose weight. Consider a lower calorie diet and regular exercise.  Prescriptions: PROMETHAZINE HCL 12.5 MG TABS (PROMETHAZINE HCL) take 1 pill by mouth three times a day as needed for nausea and vomiting.  #60 x 0   Entered and Authorized by:   Flo Shanks MD   Signed by:   Flo Shanks MD on 07/25/2009   Method used:   Faxed to ...       [facility] (mail-order)       [address] [place], [place]  [address]       Ph: [phone]       Fax: [phone]   RxID:   [id number]    Prevention & Chronic Care Immunizations   Influenza vaccine: Historical  (10/23/2008)  Influenza vaccine deferral: Not indicated  (06/20/2009)   Influenza vaccine due: 11/27/2006    Tetanus booster: 10/19/2006: Td   Td booster deferral: Not indicated  (03/22/2009)   Tetanus  booster due: 10/18/2016    Pneumococcal vaccine: Not documented    H. zoster vaccine: Not documented   H. zoster vaccine deferral: Deferred  (06/20/2009)  Colorectal Screening   Hemoccult: Not documented   Hemoccult action/deferral: Refused  (06/20/2009)    Colonoscopy: normal  (08/18/2005)   Colonoscopy action/deferral: Not indicated  (03/22/2009)   Colonoscopy due: 08/19/2015  Other Screening   Pap smear: NEGATIVE FOR INTRAEPITHELIAL LESIONS OR MALIGNANCY.  (12/01/2008)   Pap smear action/deferral: GYN Referral  (11/15/2008)   Pap smear due: 12/2011    Mammogram: Assessment: BIRADS 1.   (08/03/2007)   Mammogram action/deferral: Not indicated  (03/22/2009)   Mammogram due: 08/02/2008    DXA bone density scan: Not documented   Smoking status: current  (07/25/2009)   Smoking cessation counseling: yes  (07/25/2009)  Diabetes Mellitus   HgbA1C: 6.0  (06/20/2009)   HgbA1C action/deferral: Ordered  (06/20/2009)   Hemoglobin A1C due: 08/26/2006    Eye exam: No diabetic retinopathy.     (09/09/2007)   Diabetic eye exam action/deferral: Refused  (06/20/2009)   Eye exam due: 09/2008    Foot exam: yes  (11/30/2008)   High risk foot: Not documented   Foot care education: Not documented    Urine microalbumin/creatinine ratio: 39.3  (11/15/2008)   Urine microalbumin action/deferral: Ordered   Urine microalbumin/cr due: 08/26/2006  Lipids   Total Cholesterol: 111  (03/22/2009)   Lipid panel action/deferral: Lipid Panel ordered   LDL: 45  (03/22/2009)   LDL Direct: Not documented   HDL: 34  (03/22/2009)   Triglycerides: 159  (03/22/2009)   Lipid panel due: 11/27/2006    SGOT (AST): 13  (03/22/2009)   SGPT (ALT): 15  (03/22/2009)   Alkaline phosphatase: 44  (03/22/2009)   Total bilirubin: 0.4  (03/22/2009)  Hypertension   Last Blood Pressure: 117 / 67  (07/25/2009)   Serum creatinine: 0.85  (03/22/2009)   Serum potassium 4.4  (03/22/2009)  Self-Management Support  :   Personal Goals (by the next clinic visit) :     Personal A1C goal: 7  (03/22/2009)     Personal blood pressure goal: 130/80  (03/22/2009)     Personal LDL goal: 100  (03/22/2009)    Diabetes self-management support: Education handout, Engineer, technical sales, Written self-care plan  (06/20/2009)   Last diabetes self-management training by diabetes educator: 06/20/2009    Hypertension self-management support: Education handout, Engineer, technical sales, Written self-care plan  (06/20/2009)    Lipid self-management support: Education handout, Engineer, technical sales, Written self-care plan  (06/20/2009)     Appended Document: medication    Nurse Visit   Allergies: 1)  ! Ultram 2)  ! Morphine  Medication Administration  Medication # 1:    Medication: Promethazine 25 mg tab    Dose: 1 tablets    Route: po    Exp Date: 01/2011    Lot #: [id number]    Mfr: [employer]    Patient tolerated medication without complications    Given by: Mateo Flow [name]) (July 25, 2009 2:57 PM)   Appended Document: ACUTE-ADD PER GLAYDS/[facility] GI cocktail given at 3 PM per Dr Grier Rocher. Pt left about 3:30PM feeling sl better. Dr Grier Rocher talked with pt again.

## 2010-03-12 NOTE — Progress Notes (Signed)
Summary: refill/ hla  Phone Note Refill Request Message from:  Fax from Pharmacy on June 06, 2008 9:05 AM  Refills Requested: Medication #1:  ALPRAZOLAM 0.5 MG  TABS one pill daily as needed for anxiety Initial call taken by: Freddy Finner RN,  June 06, 2008 9:05 AM  Follow-up for Phone Call        Ms. Juanda Crumble is supposed to be taking upto one a day with a refill for essentially 600 in September.  Please clarify her usage.  Follow-up by: Felicity Pellegrini MD,  June 06, 2008 9:16 AM  Additional Follow-up for Phone Call Additional follow up Details #1::        spoke w/ pharm and this is the results of that script...it was filled last 02/06/08 for #100, the script had some left but expired on march 10...controlled substances do that quicker than other scripts...so she has nothing of that script left and basically has been doing 1 a day from my calculations but i could have figured wrong Additional Follow-up by: Freddy Finner RN,  June 07, 2008 2:44 PM    Additional Follow-up for Phone Call Additional follow up Details #2::    OK to refill #30 with 2 RF as entered.  Please call in.  Follow-up by: Felicity Pellegrini MD,  June 07, 2008 5:24 PM    Prescriptions: ALPRAZOLAM 0.5 MG  TABS (ALPRAZOLAM) one pill daily as needed for anxiety  #30 x 2   Entered and Authorized by:   Felicity Pellegrini MD   Signed by:   Felicity Pellegrini MD on 06/07/2008   Method used:   Telephoned to ...       [facility] [phone]* (retail)       [address]       [place], [place]  [address]       Ph: [phone]       Fax: [phone]   RxID:   [id number]

## 2010-03-12 NOTE — Progress Notes (Signed)
Summary: Refill/gh  Phone Note Refill Request Message from:  Pharmacy on May 03, 2007 1:58 PM  Refills Requested: Medication #1:  CYMBALTA 30 MG CPEP Take 3  tables by mouth once daily   Last Refilled: 04/10/2007  Method Requested: Electronic Initial call taken by: Sander Nephew RN,  May 03, 2007 1:58 PM  Follow-up for Phone Call        Refill approved-nurse to complete Follow-up by: Lucy Chris MD,  May 03, 2007 2:24 PM      Prescriptions: CYMBALTA 30 MG CPEP (DULOXETINE HCL) Take 3  tables by mouth once daily  #90 x 3   Entered and Authorized by:   Lucy Chris MD   Signed by:   Lucy Chris MD on 05/03/2007   Method used:   Electronically sent to ...       [place], [place]  [address]       Ph: [phone]       Fax: [phone]   RxID:   [id number]

## 2010-03-12 NOTE — Progress Notes (Signed)
Summary: refill/ hla  Phone Note Call from Patient   Caller: Patient Reason for Call: Acute Illness Summary of Call: pt calls c/o sinus infection, has just finished a zpack for bronchitis and 6 days of prednisone she has taken 3 augmentin yesterday and today that she had left over Initial call taken by: Freddy Finner RN,  November 12, 2006 4:07 PM  Follow-up for Phone Call        Will see patient tomorrow in clinic. Follow-up by: Luane School MD,  November 12, 2006 4:56 PM

## 2010-03-12 NOTE — Progress Notes (Signed)
Summary: med refill/p  Phone Note Refill Request Message from:  Fax from Pharmacy on March 06, 2008 1:30 PM  Refills Requested: Medication #1:  GLIPIZIDE XL 10 MG  TB24 Take 2 tablet by mouth once a day   Last Refilled: 10/04/2007  Medication #2:  PRILOSEC OTC 20 MG  TBEC Take two tablets by mouth daily   Last Refilled: 02/06/2008  Method Requested: Electronic Initial call taken by: Morrison Old RN,  March 06, 2008 1:30 PM  Follow-up for Phone Call        Pt needs to be seen in the next month for a diabetes checkup.  Please document an appt. Follow-up by: Rico Sheehan DO,  March 08, 2008 3:10 PM  Additional Follow-up for Phone Call Additional follow up Details #1::        Appt. scheduled 03/14/18 at 1115a.m. with Dr. Selinda Flavin Additional Follow-up by: Morrison Old RN,  March 09, 2008 9:43 AM      Prescriptions: GLIPIZIDE XL 10 MG  TB24 (GLIPIZIDE) Take 2 tablet by mouth once a day  #62 x 1   Entered and Authorized by:   Rico Sheehan DO   Signed by:   Rico Sheehan DO on 03/08/2008   Method used:   Electronically to        [facility] [phone]* (retail)       [place], [place]  [address]       Ph: [phone]       Fax: [phone]   RxID:   [id number] PRILOSEC OTC 20 MG  TBEC (OMEPRAZOLE MAGNESIUM) Take two tablets by mouth daily  #62 x 5   Entered and Authorized by:   Rico Sheehan DO   Signed by:   Rico Sheehan DO on 03/08/2008   Method used:   Electronically to        [facility] [phone]* (retail)       [address]       [place], [place]  [address]       Ph: [phone]       Fax: [phone]   RxID:   [id number]

## 2010-03-12 NOTE — Progress Notes (Signed)
Summary: refill/ hla  Phone Note Refill Request Message from:  Fax from Pharmacy on September 22, 2008 4:42 PM  Refills Requested: Medication #1:  CRESTOR 20 MG  TABS Take 1 tablet by mouth once a day Initial call taken by: Freddy Finner RN,  September 22, 2008 4:42 PM  Follow-up for Phone Call        completed Follow-up by: Trinidad Curet MD,  September 25, 2008 2:47 PM    Prescriptions: CRESTOR 20 MG  TABS (ROSUVASTATIN CALCIUM) Take 1 tablet by mouth once a day  #31 x 5   Entered and Authorized by:   Trinidad Curet MD   Signed by:   Trinidad Curet MD on 09/25/2008   Method used:   Electronically to        [facility] (mail-order)             ,          Ph: [phone]       Fax: [phone]   RxID:   [id number]

## 2010-03-12 NOTE — Progress Notes (Signed)
Summary: Prescription  Phone Note Refill Request Message from:  Fax from Pharmacy on Jul 03, 2009 4:29 PM  Refills Requested: Medication #1:  PRILOSEC OTC 20 MG  TBEC Take two tablets by mouth daily Pt would like to get the prescription changed to Omeprazole 40 mg tablets.   Method Requested: Electronic Initial call taken by: Sander Nephew RN,  Jul 03, 2009 4:30 PM  Follow-up for Phone Call        completed refill, thank you Iskra  Follow-up by: Trinidad Curet MD,  Jul 04, 2009 1:30 PM    New/Updated Medications: PRILOSEC 40 MG CPDR (OMEPRAZOLE) Take 1 tablet by mouth once a day Prescriptions: PRILOSEC 40 MG CPDR (OMEPRAZOLE) Take 1 tablet by mouth once a day  #30 x 3   Entered and Authorized by:   Trinidad Curet MD   Signed by:   Trinidad Curet MD on 07/04/2009   Method used:   Faxed to ...       [facility] (mail-order)       [address] [place], [place]  [address]       Ph: [phone]       Fax: [phone]   RxID:   [id number]

## 2010-03-12 NOTE — Progress Notes (Signed)
  Phone Note Call from Patient   Summary of Call: Pt called and c/o blood noted in BM today.  She wanted to know about her referral to Dr Amedeo Plenty.   Dr Derrel Nip aware and Lela will make referral. Initial call taken by: Gevena Cotton RN,  August 07, 2006 11:39 AM

## 2010-03-12 NOTE — Progress Notes (Signed)
Summary: refill/ hla  Phone Note Refill Request Message from:  Fax from Pharmacy on December 03, 2006 1:04 PM  Refills Requested: Medication #1:  SKELAXIN 800 MG TABS one tablet every 8 hours as needed for muscle cramps   Last Refilled: 04/12/2006  Medication #2:  vicodin 5/500 take 1 tablet by mouth every 6 hrs as needed for pain   Last Refilled: 8/26   Last Refilled: 9/25 ************please refer to 04/07/06 refill and dr Lupita Dawn note**************  Initial call taken by: Freddy Finner RN,  December 03, 2006 1:04 PM  Follow-up for Phone Call        OK to refill Skelaxin.  Dr. Lupita Dawn note only states that she was on Vicodin at that point in time.  The medication has since been removed.  If she still needs this, please have her schedule an appt to see me.   Follow-up by: Lucy Chris MD,  December 03, 2006 1:54 PM  Additional Follow-up for Phone Call Additional follow up Details #1::        pt has appt 10/28 Additional Follow-up by: Freddy Finner RN,  December 04, 2006 4:56 PM      Prescriptions: SKELAXIN 800 MG TABS (METAXALONE) one tablet every 8 hours as needed for muscle cramps  #93 x 5   Entered and Authorized by:   Lucy Chris MD   Signed by:   Lucy Chris MD on 12/03/2006   Method used:   Electronically sent to ...       [place], [place]  [address]       Ph: [phone]       Fax: [phone]   RxID:   [id number]

## 2010-03-12 NOTE — Progress Notes (Signed)
  Phone Note Other Incoming   Call placed by: Freddy Finner RN,  May 26, 2006 3:57 PM Summary of Call: pt called stating for 4 days she has had h/a, sorethroat, ears hurt, cough and rectal bleeding today. appt given 4/16 @ 3:15, dr Hilma Favors Initial call taken by: Freddy Finner RN,  May 26, 2006 3:57 PM

## 2010-03-12 NOTE — Progress Notes (Signed)
Summary: refill/ hla  Phone Note Refill Request Message from:  Fax from Pharmacy on Jul 03, 2008 2:00 PM  Refills Requested: Medication #1:  HYDROCHLOROTHIAZIDE 25 MG TABS Take 1 tablet by mouth once a day   Last Refilled: 4/23 Initial call taken by: Freddy Finner RN,  Jul 03, 2008 2:00 PM  Follow-up for Phone Call        Refill approved- Follow-up by: Felicity Pellegrini MD,  Jul 03, 2008 2:10 PM      Prescriptions: HYDROCHLOROTHIAZIDE 25 MG TABS (HYDROCHLOROTHIAZIDE) Take 1 tablet by mouth once a day  #31 x 5   Entered and Authorized by:   Felicity Pellegrini MD   Signed by:   Felicity Pellegrini MD on 07/03/2008   Method used:   Electronically to        [facility] [phone]* (retail)       [address]       [place], [place]  [address]       Ph: [phone]       Fax: [phone]   RxID:   [id number]

## 2010-03-12 NOTE — Progress Notes (Signed)
Summary: med refill/gp  Phone Note Refill Request Message from:  Fax from Pharmacy on February 01, 2007 9:34 AM  Refills Requested: Medication #1:  PRILOSEC 40 MG CPDR Take 1 capsule by mouth once a day   Last Refilled: 01/04/2007  Medication #2:  LIPITOR 20 MG TABS Take 1 tablet by mouth once a day   Last Refilled: 01/04/2007 Initial call taken by: Morrison Old RN,  February 01, 2007 9:34 AM  Follow-up for Phone Call        Refilled electronically.  Follow-up by: Bertha Stakes MD,  February 03, 2007 10:09 AM      Prescriptions: LIPITOR 20 MG TABS (ATORVASTATIN CALCIUM) Take 1 tablet by mouth once a day  #31 x 2   Entered and Authorized by:   Bertha Stakes MD   Signed by:   Bertha Stakes MD on 02/03/2007   Method used:   Electronically sent to ...       [facility] [place], [place]  [address]       Ph: [phone]       Fax: [phone]   RxID:   [id number] PRILOSEC 40 MG CPDR (OMEPRAZOLE) Take 1 capsule by mouth once a day  #30 x 2   Entered and Authorized by:   Bertha Stakes MD   Signed by:   Bertha Stakes MD on 02/03/2007   Method used:   Electronically sent to ...       [place], [place]  [address]       Ph: [phone]       Fax: [phone]   RxID:   [id number]

## 2010-03-12 NOTE — Progress Notes (Signed)
Summary: Refill/gh  Phone Note Refill Request Message from:  Pharmacy on January 11, 2008 12:13 PM  Refills Requested: Medication #1:  GLIPIZIDE XL 10 MG  TB24 Take 2 tablet by mouth once a day   Last Refilled: 10/04/2007  Method Requested: Electronic Initial call taken by: Sander Nephew RN,  January 11, 2008 12:13 PM  Follow-up for Phone Call        Refill approved-nurse to complete Follow-up by: Milta Deiters MD,  January 11, 2008 12:19 PM      Prescriptions: GLIPIZIDE XL 10 MG  TB24 (GLIPIZIDE) Take 2 tablet by mouth once a day  #62 x 1   Entered and Authorized by:   Milta Deiters MD   Signed by:   Milta Deiters MD on 01/11/2008   Method used:   Electronically to        [facility]* (retail)       [address]       [place], [place]  [address]       Ph: [phone]       Fax: [phone]   RxID:   [id number]

## 2010-03-12 NOTE — Consult Note (Signed)
Summary: Cardiology-Dr. Domenic Polite  Cardiology-Dr. Domenic Polite   Imported By: Ollen Bowl 06/01/2006 15:17:42  _____________________________________________________________________  External Attachment:    Type:   Image     Comment:   External Document

## 2010-03-12 NOTE — Consult Note (Signed)
SummarySadie Horn GI: Dr. Starr Sinclair GI: [name]   Imported By: Bonner Puna 11/10/2006 15:59:45  _____________________________________________________________________  External Attachment:    Type:   Image     Comment:   External Document

## 2010-03-12 NOTE — Assessment & Plan Note (Signed)
Summary: arm pain/gg   Vital Signs:  Patient Profile:   60 Years Old Female Height:     64 inches (162.56 cm) Weight:      181 pounds O2 Sat:      100 % Temp:     97 degrees F oral Pulse rate:   101 / minute Resp:     22 per minute BP sitting:   122 / 52  (right arm)  Pt. in pain?   yes    Location:   lower back    Intensity:   8    Type:       aching  Vitals Entered By: Nadine Counts [name]) (March 02, 2007 12:27 PM)                  PCP:  Lucy Chris MD  Chief Complaint:  arm pain.  History of Present Illness: 60 y/o with h/o DM, osteoporosis, COPD, depression, anxiety, and low back pain presents for acute visit with c/o Lt arm pain.  Says she has pain daily almost everywhere - says she has been told in the past that she has fibromyalgia.  Says that 2wk before prior appt with Dr. Selinda Flavin, started having upper arm pain around biceps.  No trauma.  Did not discuss this with Dr. Selinda Flavin.  Last night noted numbness (no tingling) in L arm at 8pm that went from neck down to fingers.  Applied heating pad and electric blanket.  Says the whole arm hurt last night - similar to muscle ache.  No trauma.  No HA, vision change, syncope, or falls.  No difficulties swallowing or talking.  Endorses pain in upper arm still, but lower arm is just sore.  No CP, sweating, N/V.  Had a panic attack this AM - was breathing fast but managed to calm herself down.  Endorses chronic dry mouth.  Never been in an MVA.  No h/o neck problems/damage.  Never had neck/back surgery.  Current Allergies: ! ULTRAM ! MORPHINE  Past Medical History:    COPD    Depression    Diabetes mellitus, type I    Osteoporosis    Anxiety    Low back pain    Chronic diffuse pain involving the back, arms, and legs     Review of Systems  The patient denies anorexia, fever, chest pain, syncope, dyspnea on exhertion, peripheral edema, prolonged cough, hemoptysis, abdominal pain, melena, hematochezia, severe  indigestion/heartburn, and hematuria.     Physical Exam  General:     Alert, well-developed, obese-appearing Caucasian woman in no acute distress. Head:     Normocephalic and atraumatic. Eyes:     Vision grossly intact.  PERRL.  Sclerae anicteric.  No conjunctival injection. Mouth:     Oropharynx pink and moist. Neck:     Supple.  Full ROM. Lungs:     Clear to auscultation bilaterally without wheezes, rales, or rhonchi. Heart:     Regular rate and rhythm without murmurs, rubs, or gallops. Msk:     ROM in all 4 extremities mildly restricted 2/2 complaints of pain.  No joint warmth or swelling seen.  No joint deformities. Extremities:     No cyanosis or edema. Neurologic:     A&Ox3.  CN II-XII intact.  No facial droop.  Strength normal in all 4 extremities.  Sensation intact to light touch throughout - no deficits.  DTRs with mild hyperreflexia bilaterally in the upper extremities. Psych:     Labile affect - sometimes  solemn, other times laughs vigorously.  Appears somewhat anxious, but good eye contact.  Cooperative.    Impression & Recommendations:  Problem # 1:  ARM PAIN, LEFT (ICD-729.5) The etiology of the pt's pain is unclear.  She reports a h/o diffuse, chronic aches and pains but says that this Lt arm pain and numbness (but no tingling) is different.  Without a history of trauma and with an entirely normal neurologic examination, the etiology is unclear.  As far as workup, I explained to the pt that plain films (x-rays) are unlikely to help - though she has a dx of osteoporosis, she has no findings to support a fracture.  A cardiac cause (e.g., MI) is unlikely given lack of any assoc sx (CP, dyspnea, diaphoresis, N/V), the fact that the pain is worse with muscle palpation, and the continuous nature of the pain.  Finally, CVA is unlikely given lack of neurologic findings (including weakness) and the worsening of the pain with muscle palpation.  Given the lack of objective  findings, I do not think imaging is indicated today.  An acute fracture is inconsistent with the history, and an MRI would only be indicated if these sx persist or worsen (for possible C-spine nerve root compression).  Pt inquired about using Lidoderm patches, which she was given in the past after she broke her clavicle.  I explained that these are only FDA-approved ONLY for use in post-herpetic neuralgia; they are commonly used for low back pain, but are not studied/indicated for such a use, so I did not feel comfortable writing them for this off-label indication.  In the end, reassurance was offered.  The pt will call/return if sx worsen or if she develops new sx such as HA, CP, weakness, facial droop, difficulty walking, etc.  If she returns, I would consider checking BMET (to check K) and CK to r/o these causes of muscle pain.  Problem # 2:  ANXIETY (ICD-300.00) Pt reported a "panic attack" this morning after waking up from sleep to find that her "legs were sore."  She endorses chronic worries about her health and had to sit down in her room and calm herself down, as she was anxious and hyperventilating.  Unsure if anxiety has anything to do with today's pain, but I encouraged the pt to try to relax as much as possible.  I am concerned that she worries excessively at times, and this may cause her to focus too much on her health problems and lose hope for feeling better.  Her updated medication list for this problem includes:    Alprazolam 0.5 Mg Tabs (Alprazolam) ..... One pill daily as needed for anxiety    Cymbalta 30 Mg Cpep (Duloxetine hcl) .Marland Kitchen... Take 3  tables by mouth once daily    Imipramine Hcl 50 Mg Tabs (Imipramine hcl) .Marland Kitchen... Take 1/2 tablet by mouth at night   Problem # 3:  OSTEOPOROSIS (ICD-733.00) Last Dexa scan was 01/07/05, with T-scores diagnostic for osteoporosis.  Pt on Fosamax.  If she has not had a more recent study, I recommend a repeat Dexa sometime this year (follow-up scans  are typically done Q2-3y on therapy) to ensure therapeutic efficacy.  Her updated medication list for this problem includes:    Fosamax 70 Mg Tabs (Alendronate sodium) .Marland Kitchen... Take one tablet weekly   Problem # 4:  DYSLIPIDEMIA (ICD-272.4) I do not see an FLP more recent than in 11/2005.  I recommend a repeat FLP be done at some point before next  visit to assess lipid status and therapeutic efficacy of pt's atorvastatin.  Her updated medication list for this problem includes:    Lipitor 20 Mg Tabs (Atorvastatin calcium) .Marland Kitchen... Take 1 tablet by mouth once a day  Labs Reviewed: Chol: 152 (11/26/2005)   HDL: 34 (11/26/2005)   LDL: 76 (11/26/2005)   TG: 209 (11/26/2005) SGOT: 16 (01/05/2007)   SGPT: 17 (01/05/2007)   Complete Medication List: 1)  Prilosec 40 Mg Cpdr (Omeprazole) .... Take 1 capsule by mouth once a day 2)  Alprazolam 0.5 Mg Tabs (Alprazolam) .... One pill daily as needed for anxiety 3)  Albuterol 90 Mcg/act Aers (Albuterol) .... P.r.n wheezing 4)  Hydrochlorothiazide 25 Mg Tabs (Hydrochlorothiazide) .... Take 1 tablet by mouth once a day 5)  Spiriva Handihaler 18 Mcg Caps (Tiotropium bromide monohydrate) .... Inhale contents of 1 capsule once a day 6)  Lipitor 20 Mg Tabs (Atorvastatin calcium) .... Take 1 tablet by mouth once a day 7)  Fosamax 70 Mg Tabs (Alendronate sodium) .... Take one tablet weekly 8)  Januvia 100 Mg Tabs (Sitagliptin phosphate) .... Take 1 tablet by mouth once a day 9)  Advair Diskus 250-50 Mcg/dose Misc (Fluticasone-salmeterol) .Marland Kitchen.. 1 puff twice daily 10)  Glucophage Xr 500 Mg Tb24 (Metformin hcl) .... 2 pills in am, 2 pills in pm 11)  Cymbalta 30 Mg Cpep (Duloxetine hcl) .... Take 3  tables by mouth once daily 12)  Aspir-low 81 Mg Tbec (Aspirin) 13)  Glipizide 5 Mg Tabs (Glipizide) .... 1/2 pill twice daily with meals 14)  Vicodin 5-500 Mg Tabs (Hydrocodone-acetaminophen) .... Take one tablet by mouth every eight hours as needed for pain 15)   Imipramine Hcl 50 Mg Tabs (Imipramine hcl) .... Take 1/2 tablet by mouth at night   Patient Instructions: 1)  Please make a follow-up appointment within the next month to see your primary care doctor, Dr. Selinda Flavin. 2)  If your pain worsens, or if you start having symptoms such as fever, chest pain, facial droop, or difficulty speaking, come to the Emergency Department immediately to be evaluated.    ]

## 2010-03-12 NOTE — Letter (Signed)
Summary: *Referral Letter  [facility]  [address]   [place], [place] [address]   Phone: [phone]  Fax: [phone]        02/25/2008  Elen Acero [address] [place], [place]  [address]  Phone: [phone]  To Whom It May Concern:  Ms. Niehaus has multiple medical problems including fibromyalgia and arthropathy from which she has significant disability.  She seems to derive significant subjective benefit from hydrotherapy which she should continue if at all possible.  If you have any questions regarding this, please feel free to contact me.    Sincerely,  Lucy Chris MD

## 2010-03-12 NOTE — Letter (Signed)
Summary: [employer]  [employer]   Imported By: Bonner Puna 06/20/2008 13:44:35  _____________________________________________________________________  External Attachment:    Type:   Image     Comment:   External Document

## 2010-03-12 NOTE — Progress Notes (Signed)
  Phone Note Outgoing Call   Call placed by: Barnabas Harries,  April 21, 2006 5:13 PM Summary of Call: patient emailed and asked that I call her. Called and she wanted me to call her tomorrow. Initial call taken by: Barnabas Harries,  April 22, 2006 10:13 AM  Follow-up for Phone Call        "I have got to talk to somebody about this diabetes stuff". Wants to know if we have classes.She had a low on 3/10 as follows:  slept late 12:30 CBG:160 2pm- oatmeal w/ 2 metformin  2.5 glipizide. 6:30ish- started shaking like a leaf. 6:40- fixed bowl of cereal, CBG: 86 mg/dl after 1 bite. 7:30 CBG- 163, 1.5 hour  later  later-326  9:30 omelet- januvua-183m , metformin was really hungry,ate nuts to curb appetite 11:30 sugar was down to 101 Educated about hypoglycemia prevention, symptoms & treatment. Patient verbalized understanding. Recommended she schedule and appointment for more education. Also that she should call if she has repeated hypoglycemia for no apparent reason as it could lead to weight gain. Follow-up by: DBarnabas Harries  April 22, 2006 10:46 AM

## 2010-03-12 NOTE — Progress Notes (Signed)
Summary: refill/ hla  Phone Note Refill Request Message from:  Fax from Pharmacy on January 08, 2010 6:09 PM  Refills Requested: Medication #1:  WELLBUTRIN XL 150 MG XR24H-TAB take 1 tablet once daily   Dosage confirmed as above?Dosage Confirmed Initial call taken by: Freddy Finner RN,  January 08, 2010 6:09 PM  Follow-up for Phone Call        Has Dec appt Follow-up by: [name] MD,  January 09, 2010 11:18 AM    Prescriptions: WELLBUTRIN XL 150 MG XR24H-TAB (BUPROPION HCL) take 1 tablet once daily  #30 x 5   Entered and Authorized by:   [name] MD   Signed by:   [name] MD on 01/09/2010   Method used:   Faxed to ...       [facility] (mail-order)       [address] [place], [place]  [address]       Ph: [phone]       Fax: [phone]   [place]:   [phone]

## 2010-03-12 NOTE — Assessment & Plan Note (Signed)
Summary: EST-CK/FU/MEDS/CFB   Vital Signs:  Patient Profile:   60 Years Old Female Height:     64 inches (162.56 cm) Weight:      180.0 pounds (81.82 kg) BMI:     31.01 Temp:     97.6 degrees F (36.44 degrees C) oral Pulse rate:   80 / minute BP sitting:   108 / 77  (right arm)  Pt. in pain?   yes  Vitals Entered By: Lucky Rathke NT II (September 20, 2007 11:19 AM)              Is Patient Diabetic? Yes  Nutritional Status BMI of > 30 = obese  Have you ever been in a relationship where you felt threatened, hurt or afraid?No   Does patient need assistance? Functional Status Self care Ambulation Normal     PCP:  Lucy Chris MD  Chief Complaint:  FOLLOW UP / HAVE A LIST OF ITEMS TO TALK ABOUT.Marland Kitchen  History of Present Illness: [name] is a 60 yo woman with PMH as noted who presents today for routine follow-up.  Pain is improved after physical therapy which is wonderful.  She needs paperwork filled out for handicap sticker.  She also reports daily, mild headaches since starting Topamax but says they are not severe enough to have her want to stop it.      Current Allergies: ! ULTRAM ! MORPHINE    Risk Factors: Tobacco use:  current    Year started:  AT THE AGE OF 12    Cigarettes:  Yes -- 1.5 pack(s) per day Alcohol use:  no Exercise:  no Seatbelt use:  100 %  Colonoscopy History:    Date of Last Colonoscopy:  08/18/2005  Mammogram History:    Date of Last Mammogram:  08/03/2007  PAP Smear History:    Date of Last PAP Smear:  06/29/2007   Review of Systems  General      Complains of fatigue.      Denies chills and fever.  CV      Denies chest pain or discomfort, palpitations, shortness of breath with exertion, and swelling of feet.  GI      Denies abdominal pain, bloody stools, change in bowel habits, dark tarry stools, nausea, and vomiting.   Physical Exam  General:     alert and well-developed.  NAD Lungs:     normal respiratory effort,  no accessory muscle use, and normal breath sounds.   Heart:     normal rate, regular rhythm, and no murmur.   Abdomen:     soft, non-tender, and normal bowel sounds.   Extremities:     no edema    Impression & Recommendations:  Problem # 1:  AODM (ICD-250.00) Previous good control.  Will repeat a HgbA1C and lipid panel at our next visit.    Her updated medication list for this problem includes:    Glucophage Xr 500 Mg Tb24 (Metformin hcl) .Marland Kitchen... 2 pills in am, 2 pills in pm    Aspir-low 81 Mg Tbec (Aspirin)    Glipizide Xl 10 Mg Tb24 (Glipizide) .Marland Kitchen... Take 2 tablet by mouth once a day   Problem # 2:  TOBACCO ABUSE (ICD-305.1) Patient is in the contemplative stage.  Will cont to encourage cessation.    Problem # 3:  Preventive Health Care (ICD-V70.0) Patient is uptodate  Problem # 4:  FIBROMYALGIA (ICD-729.1) Greatly improved with PT.  Ok to cont Vicodin as needed.  Her updated medication  list for this problem includes:    Aspir-low 81 Mg Tbec (Aspirin)    Vicodin 5-500 Mg Tabs (Hydrocodone-acetaminophen) .Marland Kitchen... Take one tablet by mouth every eight hours as needed for pain   Complete Medication List: 1)  Prilosec Otc 20 Mg Tbec (Omeprazole magnesium) .... Take two tablets by mouth daily 2)  Alprazolam 0.5 Mg Tabs (Alprazolam) .... One pill daily as needed for anxiety 3)  Albuterol 90 Mcg/act Aers (Albuterol) .... Two puffs every four hours as needed for shortness of breath 4)  Hydrochlorothiazide 25 Mg Tabs (Hydrochlorothiazide) .... Take 1 tablet by mouth once a day 5)  Spiriva Handihaler 18 Mcg Caps (Tiotropium bromide monohydrate) .... Inhale contents of 1 capsule once a day 6)  Crestor 20 Mg Tabs (Rosuvastatin calcium) .... Take 1 tablet by mouth once a day 7)  Advair Diskus 250-50 Mcg/dose Misc (Fluticasone-salmeterol) .Marland Kitchen.. 1 puff twice daily 8)  Glucophage Xr 500 Mg Tb24 (Metformin hcl) .... 2 pills in am, 2 pills in pm 9)  Cymbalta 30 Mg Cpep (Duloxetine hcl) .... Take  3  tables by mouth once daily 10)  Aspir-low 81 Mg Tbec (Aspirin) 11)  Glipizide Xl 10 Mg Tb24 (Glipizide) .... Take 2 tablet by mouth once a day 12)  Vicodin 5-500 Mg Tabs (Hydrocodone-acetaminophen) .... Take one tablet by mouth every eight hours as needed for pain 13)  Loratadine 10 Mg Tabs (Loratadine) .... Take 1 tablet by mouth once a day 14)  Nystatin 100000 Unit/gm Crea (Nystatin) .... Apply to affected area two times a day. 15)  Ferrous Sulfate 325 (65 Fe) Mg Tbec (Ferrous sulfate) .... Take 1 tablet by mouth three times a day 16)  Topamax 25 Mg Tabs (Topiramate) .... Take 1 tablet by mouth twice a day   Patient Instructions: 1)  Please schedule a follow-up appointment in 2 months.   ][Preventive Care Screening-CCC]  Appended Document: EST-CK/FU/MEDS/CFB Billing code of est pt level III for fibromyalgia, DM, and tobacco use should be added

## 2010-03-12 NOTE — Progress Notes (Signed)
Summary: alprazolam/hla  Phone Note Call from Patient   Summary of Call: pt called to say she was not getting her alprazolam the way it was prescribed on 9/7, spoke w/ pharm and indeed they are only giving her 30 day supply at a time, corrected this and they will give her #100 on next fill date, at that time the 9/7 script will end because the law reads that you may only refill 5 times or 6 mon which ever is less...this was quoted by the pharmacist.... Initial call taken by: Freddy Finner RN,  December 21, 2008 5:06 PM

## 2010-03-12 NOTE — Assessment & Plan Note (Signed)
Summary: FU OV/VS   Vital Signs:  Patient Profile:   60 Years Old Female Height:     64 inches (162.56 cm) Weight:      181.6 pounds (82.55 kg) Temp:     97.0 degrees F (36.11 degrees C) oral Pulse rate:   99 / minute BP sitting:   131 / 68  (right arm)  Pt. in pain?   yes    Location:   legs/back/bunion    Intensity:   7  Vitals Entered By: Nadine Counts [name]) (December 08, 2006 8:53 AM)              Is Patient Diabetic? No  Does patient need assistance? Functional Status Self care Ambulation Normal     PCP:  Lucy Chris MD  Chief Complaint:  med refill (pt was told to sign new pain contract) "Bunion" right foot.  History of Present Illness: 60 yo woman with PMH as noted who presents today for two reasons.  The first is that she wants a refill on her Vicodin which was originally prescribed for back pain and fibromyalgia.  She has not tried PT but has been on Ultram before.  Pain has not particularly worsened but does limit her activity.  At our last visit, we discussed Lyrica, and she was given information to take home and consider.  Her main reluctance is a bad reaction that a friend had.    Her second complaint is of a bunion on her left medial aspect of great toe.  She was previously followed by podiatry but now owes them money and has been unable to follow-up.    Current Allergies: ! ULTRAM ! MORPHINE    Risk Factors:  Tobacco use:  current    Year started:  AT THE AGE OF 12    Cigarettes:  Yes -- 1.5 pack(s) per day    Counseled to quit/cut down tobacco use:  yes Alcohol use:  no Exercise:  no Seatbelt use:  100 %   Review of Systems  General      Complains of fatigue.      Denies chills, fever, and weakness.  CV      Denies chest pain or discomfort, lightheadness, shortness of breath with exertion, and swelling of feet.  GI      Complains of abdominal pain.      Denies bloody stools and dark tarry stools.      Positive for loose  stools.   Physical Exam  General:     alert and well-developed.  NAD   Lungs:     normal respiratory effort and normal breath sounds.   Heart:     normal rate and regular rhythm.   Abdomen:     soft, obese, minimal tenderness in epigastrum  Extremities:     Approx 1cm by 1cm bunion on lateral aspect of right foot.   Neurologic:     Ambulates without assistance with a slightly stiff gait    Impression & Recommendations:  Problem # 1:  LOW BACK PAIN (ICD-724.2) Discussed PT with pt.  Due to transportation issue, pt wishes to hold off for now.  Lyrica started.  Will continue Vicodin while Lyrica hopefully begins to take effect.  Lyrica started at lowest dose and will need to be titrated if effective.   Will also consider Flexeril in the future.  Goal is to get her off Vicodin.      Her updated medication list for this problem includes:  Aspir-low 81 Mg Tbec (Aspirin)    Skelaxin 800 Mg Tabs (Metaxalone) ..... One tablet every 8 hours as needed for muscle cramps    Vicodin 5-500 Mg Tabs (Hydrocodone-acetaminophen) .Marland Kitchen... Take one tablet by mouth every eight hours as needed for pain   Problem # 2:  BUNION (ICD-727.1) Assessment: Unchanged Pt refusing podiatry referral secondary to outstanding bill.  Recommended soft shoes and careful protection of area.    Problem # 3:  FIBROMYALGIA (ICD-729.1) Will try Lyrica and Cymbalta as a combination.  Pt states she is unable to participate in water therapy secondary to finances and unable to start PT secondary to transportation issues.  Will try to wean Vicodin.   Her updated medication list for this problem includes:    Aspir-low 81 Mg Tbec (Aspirin)    Skelaxin 800 Mg Tabs (Metaxalone) ..... One tablet every 8 hours as needed for muscle cramps    Vicodin 5-500 Mg Tabs (Hydrocodone-acetaminophen) .Marland Kitchen... Take one tablet by mouth every eight hours as needed for pain   Complete Medication List: 1)  Prilosec 40 Mg Cpdr (Omeprazole) ....  Take 1 capsule by mouth once a day 2)  Alprazolam 0.5 Mg Tabs (Alprazolam) .... One pill daily as needed for anxiety 3)  Albuterol 90 Mcg/act Aers (Albuterol) .... P.r.n wheezing 4)  Hydrochlorothiazide 25 Mg Tabs (Hydrochlorothiazide) .... Take 1 tablet by mouth once a day 5)  Spiriva Handihaler 18 Mcg Caps (Tiotropium bromide monohydrate) .... Inhale contents of 1 capsule once a day 6)  Lipitor 20 Mg Tabs (Atorvastatin calcium) .... Take 1 tablet by mouth once a day 7)  Fosamax 70 Mg Tabs (Alendronate sodium) .... Take one tablet weekly 8)  Januvia 100 Mg Tabs (Sitagliptin phosphate) .... Take 1 tablet by mouth once a day 9)  Advair Diskus 250-50 Mcg/dose Misc (Fluticasone-salmeterol) .Marland Kitchen.. 1 puff twice daily 10)  Glucophage Xr 500 Mg Tb24 (Metformin hcl) .... 2 pills in am, 2 pills in pm 11)  Cymbalta 30 Mg Cpep (Duloxetine hcl) .... Take 3  tables by mouth once daily 12)  Claritin 10 Mg Tabs (Loratadine) .... Once daily tab 13)  Aspir-low 81 Mg Tbec (Aspirin) 14)  Skelaxin 800 Mg Tabs (Metaxalone) .... One tablet every 8 hours as needed for muscle cramps 15)  Glipizide 5 Mg Tabs (Glipizide) .... 1/2 pill twice daily with meals 16)  Lyrica 50 Mg Caps (Pregabalin) .... Take 1 tablet by mouth twice a day 17)  Vicodin 5-500 Mg Tabs (Hydrocodone-acetaminophen) .... Take one tablet by mouth every eight hours as needed for pain   Patient Instructions: 1)  Please schedule a follow-up appointment in 1 month.    Prescriptions: VICODIN 5-500 MG  TABS (HYDROCODONE-ACETAMINOPHEN) Take one tablet by mouth every eight hours as needed for pain  #90 x 2   Entered and Authorized by:   Lucy Chris MD   Signed by:   Lucy Chris MD on 12/08/2006   Method used:   Handwritten   RxID:   [id number] LYRICA 50 MG  CAPS (PREGABALIN) Take 1 tablet by mouth twice a day  #62 x 2   Entered and Authorized by:   Lucy Chris MD   Signed by:   Lucy Chris MD on 12/08/2006   Method used:   Print  then Give to Patient   RxID:   [id number]  ]

## 2010-03-12 NOTE — Progress Notes (Signed)
Summary: phone/gg  Phone Note Call from Patient   Caller: Patient Summary of Call: Pt states onset  Sunday of tenderness to  rt side flank   area,  took vicodin which relieved pain.  Yesterday  constant pain to that area.  Today rates pain 5/10. Denies dysuria, blood in urine, or frequency.  Some nausea Will see today Initial call taken by: Gevena Cotton RN,  January 11, 2008 9:15 AM

## 2010-03-12 NOTE — Assessment & Plan Note (Signed)
Summary: TETANUS SHOT PER GAYLE/CFB  Nurse Visit    Prior Medications: PRILOSEC 40 MG CPDR (OMEPRAZOLE) Take 1 capsule by mouth once a day ALPRAZOLAM 0.5 MG  TABS (ALPRAZOLAM) one pill daily as needed for anxiety ALBUTEROL 90 MCG/ACT AERS (ALBUTEROL) p.r.n wheezing HYDROCHLOROTHIAZIDE 25 MG TABS (HYDROCHLOROTHIAZIDE) Take 1 tablet by mouth once a day SPIRIVA HANDIHALER 18 MCG CAPS (TIOTROPIUM BROMIDE MONOHYDRATE) Inhale contents of 1 capsule once a day LIPITOR 20 MG TABS (ATORVASTATIN CALCIUM) Take 1 tablet by mouth once a day FOSAMAX 70 MG TABS (ALENDRONATE SODIUM) Take one tablet weekly JANUVIA 100 MG TABS (SITAGLIPTIN PHOSPHATE) Take 1 tablet by mouth once a day ADVAIR DISKUS 250-50 MCG/DOSE MISC (FLUTICASONE-SALMETEROL) 1 puff twice daily GLUCOPHAGE XR 500 MG TB24 (METFORMIN HCL) 2 pills in AM, 2 pills in PM CYMBALTA 30 MG CPEP (DULOXETINE HCL) Take 2  tables by mouth once daily CLARITIN 10 MG TABS (LORATADINE) once daily tab ASPIR-LOW 81 MG TBEC (ASPIRIN)  SKELAXIN 800 MG TABS (METAXALONE) one tablet every 8 hours as needed for muscle cramps GLIPIZIDE 5 MG TABS (GLIPIZIDE) 1/2 pill twice daily with meals Current Allergies: ! ULTRAM ! MORPHINE   Tetanus/Td Vaccine    Vaccine Type: Td    Site: left deltoid    Mfr: sanofiPasteur    Dose: 0.5 ml    Route: IM    Given by: Gevena Cotton RN    Exp. Date: 06/17/2007    Lot #: [id number]    VIS given: 08/21/04 version given October 19, 2006.   Orders Added: 1)  Tetanus Toxoid w/Dx [36922] 2)  Admin 1st Vaccine [30097]

## 2010-03-12 NOTE — Progress Notes (Signed)
Summary: med refill/gp  Phone Note Refill Request Message from:  Fax from Pharmacy on November 04, 2007 11:28 AM  Refills Requested: Medication #1:  GLIPIZIDE XL 10 MG  TB24 Take 2 tablet by mouth once a day   Last Refilled: 10/04/2007  Method Requested: Electronic Initial call taken by: Morrison Old RN,  November 04, 2007 11:28 AM  Follow-up for Phone Call        Refilled electronically.  Follow-up by: Bertha Stakes MD,  November 04, 2007 12:02 PM      Prescriptions: GLIPIZIDE XL 10 MG  TB24 (GLIPIZIDE) Take 2 tablet by mouth once a day  #62 x 1   Entered and Authorized by:   Bertha Stakes MD   Signed by:   Bertha Stakes MD on 11/04/2007   Method used:   Electronically to        [facility] (retail)       [address]       [place], [place]  [address]       Ph: [phone]       Fax: [phone]   RxID:   [id number]

## 2010-03-12 NOTE — Assessment & Plan Note (Signed)
Summary: (ACUTE-KIRK)SEE [name]   Vital Signs:  Patient Profile:   60 Years Old Female Height:     64 inches (162.56 cm) Weight:      183.5 pounds (83.41 kg) BMI:     31.61 Temp:     97.0 degrees F (36.11 degrees C) oral Pulse rate:   99 / minute BP sitting:   121 / 70  (right arm) Cuff size:   regular  Pt. in pain?   yes    Location:   head    Intensity:   5    Type:       sharp  Vitals Entered By: Lucky Rathke (November 13, 2006 9:59 AM)              Is Patient Diabetic? Yes  Nutritional Status NORMAL  Have you ever been in a relationship where you felt threatened, hurt or afraid?No   Does patient need assistance? Functional Status Self care Ambulation Normal     PCP:  Lucy Chris MD  Chief Complaint:  SINUS HEADACHE.  History of Present Illness: [name] is a 60 yr female with multiple medical problems presenting with a 3 days history of yellow nasal discharge, pain and tenderness on the L side of her face and a headache. She was recently treated  for a bronchitis in the ED and completed  a couse of steroids and z pack a week ago. WIth the start of this illness she self medicated herself with augmentin without any relief. SHe denies fever or chills or vision problems  Current Allergies: ! ULTRAM ! MORPHINE    Risk Factors:  Tobacco use:  current    Year started:  AT THE AGE OF 12    Cigarettes:  Yes -- 1.5 pack(s) per day Alcohol use:  no Exercise:  no Seatbelt use:  100 %    Physical Exam  General:     alert, well-developed, and well-nourished.   Head:     normocephalic, atraumatic, and no abnormalities observed.   Eyes:     vision grossly intact and pupils equal.   Ears:     R ear normal and L ear normal.   Nose:     no external deformity, no external erythema, and nasal discharge yellow.   Mouth:     pharynx pink and moist.   Neck:     supple and no masses.   Lungs:     normal respiratory effort, no intercostal retractions, no  accessory muscle use, normal breath sounds, and no dullness.   Heart:     normal rate, regular rhythm, no murmur, and no gallop.      Impression & Recommendations:  Problem # 1:  SINUSITIS, ACUTE MAXILLARY (ICD-461.0) She presents with a 3  days h/o yellow nasal discharge, headache and pain on the L side of her her face. L maxillary area was tender on palpation. No fever or chills. She has used augmentin for 2 days without significant benefit. SHe has acute sinusitis and will treat with second line agent levaquin as she has not responded to augmentin. She was also asked to continue to use claritin, steam inhalation as needed. She was asked to report in 5-7 days if she did not respond or had a reaction to the antibiotics The following medications were removed from the medication list:  Augmentin 875-125 Mg Tabs (Amoxicillin-pot clavulanate) .Marland Kitchen... Take 1 tablet by mouth twice daily for 10 days.  Her updated medication list for this problem  includes:    Levaquin 750 Mg Tabs (Levofloxacin) .Marland Kitchen... Take 1 tablet by mouth once a day  The following medications were removed from the medication list:    Augmentin 875-125 Mg Tabs (Amoxicillin-pot clavulanate) .Marland Kitchen... Take 1 tablet by mouth twice daily for 10 days.  Her updated medication list for this problem includes:    Levaquin 750 Mg Tabs (Levofloxacin) .Marland Kitchen... Take 1 tablet by mouth once a day   Complete Medication List: 1)  Prilosec 40 Mg Cpdr (Omeprazole) .... Take 1 capsule by mouth once a day 2)  Alprazolam 0.5 Mg Tabs (Alprazolam) .... One pill daily as needed for anxiety 3)  Albuterol 90 Mcg/act Aers (Albuterol) .... P.r.n wheezing 4)  Hydrochlorothiazide 25 Mg Tabs (Hydrochlorothiazide) .... Take 1 tablet by mouth once a day 5)  Spiriva Handihaler 18 Mcg Caps (Tiotropium bromide monohydrate) .... Inhale contents of 1 capsule once a day 6)  Lipitor 20 Mg Tabs (Atorvastatin calcium) .... Take 1 tablet by mouth once a day 7)  Fosamax 70 Mg  Tabs (Alendronate sodium) .... Take one tablet weekly 8)  Januvia 100 Mg Tabs (Sitagliptin phosphate) .... Take 1 tablet by mouth once a day 9)  Advair Diskus 250-50 Mcg/dose Misc (Fluticasone-salmeterol) .Marland Kitchen.. 1 puff twice daily 10)  Glucophage Xr 500 Mg Tb24 (Metformin hcl) .... 2 pills in am, 2 pills in pm 11)  Cymbalta 30 Mg Cpep (Duloxetine hcl) .... Take 3  tables by mouth once daily 12)  Claritin 10 Mg Tabs (Loratadine) .... Once daily tab 13)  Aspir-low 81 Mg Tbec (Aspirin) 14)  Skelaxin 800 Mg Tabs (Metaxalone) .... One tablet every 8 hours as needed for muscle cramps 15)  Glipizide 5 Mg Tabs (Glipizide) .... 1/2 pill twice daily with meals 16)  Levaquin 750 Mg Tabs (Levofloxacin) .... Take 1 tablet by mouth once a day  Other Orders: Influenza Vaccine MCR (67341) Admin 1st Vaccine (93790)   Patient Instructions: 1)  Please schedule an appointment with your primary doctor in : 2)  Take your antibiotic as prescribed until ALL of it is gone, but stop if you develop a rash or swelling and contact our office as soon as possible. 3)  .Use warm moist compresses, and over the counter decongestants ( only as directed). Call if no improvement in 5-7 days, sooner if increasing pain, fever, or new symptoms.    ]  Influenza Vaccine    Vaccine Type: Fluvax MCR    Site: right deltoid    Mfr: Novartis    Dose: 0.5 ml    Route: IM    Given by: Sander Nephew RN    Exp. Date: 08/10/2007    Lot #: [id number]    VIS given: 08/09/04 version given November 13, 2006.  Flu Vaccine Consent Questions    Do you have a history of severe allergic reactions to this vaccine? no    Any prior history of allergic reactions to egg and/or gelatin? no    Do you have a sensitivity to the preservative Thimersol? no    Do you have a past history of Guillan-Barre Syndrome? no    Do you currently have an acute febrile illness? no    Have you ever had a severe reaction to latex? no    Vaccine information given and  explained to patient? no    Are you currently pregnant? no

## 2010-03-12 NOTE — Progress Notes (Signed)
  Phone Note Call from Patient   Summary of Call: Pt states she developed epigastric pain about 1 hour ago, 5/10, dull, constant and unchanged, without radiation, not helped by PPI or mylanta chewables.  She denies any other symptoms including CP, SOB, palpitations, diaphoresis, pain in shoulder blades, arm or neck, fever, chills, nausea, vomiting, diarrhea, melena, hematochezia.  She actually had a BM earlier today, prior to pain, and was not much different from baseline.  She has had EGD/colonoscopy w/in past 1-2 years (no ulcers).  She has also had a recent myoview and followed by Dr. Domenic Polite for MVR (has recent echo as well).  Pt is also using PPI.  She does have her gallbladder in place and last [place] found was in 2003.  DDx includes gastritis/indigestion, cholecystitis, pancreatitis, less likely PUD or ACS.  Have discussed in detail with patient who is not very eager to be seen in ED.  Instructed to call if not improved or minor changes and to come to ED if any concerning sx (discussed). Initial call taken by: Alphia Moh MD,  September 25, 2007 8:29 PM

## 2010-03-12 NOTE — Progress Notes (Signed)
Summary: phone call/gg  Phone Note Call from Patient   Summary of Call: pt called in and was scratched by a cat.  She would like a tetanus shot.  She will be in today for the shot.  Please put the order in for this.  Pt will be seen today Initial call taken by: Gevena Cotton RN,  October 19, 2006 12:43 PM  New Problems: CAT SCRATCH (ICD-959.9)   New Problems: CAT SCRATCH (ICD-959.9)

## 2010-03-12 NOTE — Assessment & Plan Note (Signed)
Summary: Walk-in  Pt walked in c/o high blood sugars at home and wants to know if her lab work came back and can she start back on her Metformin. Checked her CBG. Discussed lab results with Dr. Marinda Elk, attending, who cleared the pt to start back on her Metformin. ..................................................................Marland KitchenPollyann Samples  Jun 30, 2006 3:59 PM  Laboratory Results   Blood Tests   Date/Time Recieved: Jun 30, 2006 3:54 PM  Date/Time Reported: ..................................................................Marland KitchenPollyann Samples  Jun 30, 2006 3:54 PM   CBG Random: 159

## 2010-03-12 NOTE — Progress Notes (Signed)
Summary: refill/ hla  Phone Note Refill Request Message from:  Fax from Pharmacy on September 01, 2006 11:26 AM  Refills Requested: Medication #1:  JANUVIA 100 MG TABS Take 1 tablet by mouth once a day   Last Refilled: 08/02/2006 Initial call taken by: Freddy Finner RN,  September 01, 2006 11:26 AM  Follow-up for Phone Call        Refill approved-nurse to complete.  Please schedule an app0t with me  so we can discuss her medications.   Follow-up by: Lucy Chris MD,  September 02, 2006 2:36 PM     Prescriptions: JANUVIA 100 MG TABS (SITAGLIPTIN PHOSPHATE) Take 1 tablet by mouth once a day  #31 x 5   Entered and Authorized by:   Lucy Chris MD   Signed by:   Lucy Chris MD on 09/02/2006   Method used:   Electronically sent to ...       [facility]       [place], [place]  [address]       Ph:        Fax:    RxID:   [id number]

## 2010-03-12 NOTE — Progress Notes (Signed)
Summary: phone/gg  Phone Note Call from Patient   Caller: Patient Summary of Call: Pt called states 2 - 3 weeks ago she was taken off fluid pill and started on  BP med.  Last 2 1/2 days she has noted swelling to both  ankles and feet. She has  Increase swelling in evening.  Today she did not take BP med but she did take a fluid pill.   Pt  denies any SOB.  She has appointment scheduled on Friday.  Pt asked to keep legs elevated when sitting and keep appointment on Friday. Will this be okay? Initial call taken by: Gevena Cotton RN,  September 12, 2009 3:06 PM  Follow-up for Phone Call        Do we have any openings on Thur? IF not, Friday is OK bc was only on HCTZ and appears edema is only in the evenings and no dyspnea. Follow-up by: [name] MD,  September 12, 2009 3:28 PM  Additional Follow-up for Phone Call Additional follow up Details #1::        Talked with Dr Lynnae January and Friday is okay as pt had this appointment scheduled ahead of time. Additional Follow-up by: Gevena Cotton RN,  September 12, 2009 4:59 PM

## 2010-03-12 NOTE — Progress Notes (Signed)
Summary: refill request/dms  Phone Note Refill Request Message from:  Fax from Pharmacy on April 07, 2006 8:36 AM  Refills Requested: Medication #1:  VICODIN 5-500 MG TABS Take 1 tablet by mouth every six hours as needed for pain.   Dosage confirmed as above?Dosage Confirmed   Last Refilled: 01/16/2006  Method Requested: Fax to [facility] Initial call taken by: Pollyann Samples,  April 07, 2006 8:40 AM  Follow-up for Phone Call        Rx denied because not able to find on med list. If there are further questions, Dr. Derrel Nip (PCP) should be addressed.  Follow-up by: Luane School MD,  April 07, 2006 10:22 AM  Additional Follow-up for Phone Call Additional follow up Details #1::        Forwarded to Dr. Derrel Nip for further instructions. .........Marland KitchenPollyann Samples,  April 07, 2006 11:13 AM  Med called in per Dr Derrel Nip Additional Follow-up by: Gevena Cotton RN,  April 08, 2006 4:17 PM  New/Updated Medications: VICODIN 5-500 MG TABS (HYDROCODONE-ACETAMINOPHEN) Take 1 tablet by mouth every six hours as needed for pain  Additional Follow-up for Phone Call Additional follow up Details #2::    I don't understand.  Vicodin is on her med list as of December 2007.  OK to refill. Follow-up by: Dr. Derrel Nip 04/08/06 3:07PM  Additional Follow-up for Phone Call Additional follow up Details #3:: Details for Additional Follow-up Action Taken: Rx faxed to pharmacy. Additional Follow-up by: Pollyann Samples,  April 08, 2006 4:18 PM  New/Updated Medications: VICODIN 5-500 MG TABS (HYDROCODONE-ACETAMINOPHEN) Take 1 tablet by mouth every six hours as needed for pain  Prescriptions: VICODIN 5-500 MG TABS (HYDROCODONE-ACETAMINOPHEN) Take 1 tablet by mouth every six hours as needed for pain  #60 x 2   Entered and Authorized by:   [name] MD   Signed by:   [name] MD on 04/08/2006   Method used:   Print then Give to Patient   RxID:   [id number]

## 2010-03-12 NOTE — Progress Notes (Signed)
  Phone Note Outgoing Call   Call placed by: Soc. Work Call placed to: Patient Summary of Call: Ret [name]'s call today.  At least 30 minutes on phone with [name]. She is out of Cymbalta and says needs prior approval for RX [place] but neither Phys Pharm or IM has helped her with this.    Also has other issues,  needs new CPAP/sleep study,  diabetic shoes.  Overall unhappy with follow-up re: her medical care since Dr. Selinda Flavin left.   Follow-up for Phone Call        I have called the insurance company [phone] and was told that even if the strengths are changed there will be a problem so the best way to solve this is to fill out prior authorization form. I have requested teh form to be faxed to me at [phone] (I have called [phone] for that form) and will await for the fax which should be here in 10 mins and will fill it out and fax it back. It will take about 24 hours for the approval.  Thank you iskra Follow-up by: Trinidad Curet MD,  May 30, 2009 11:12 AM

## 2010-03-12 NOTE — Procedures (Signed)
Summary: UGI/Endoscopy  UGI/Endoscopy   Imported By: Ollen Bowl 07/10/2006 12:15:22  _____________________________________________________________________  External Attachment:    Type:   Image     Comment:   External Document

## 2010-03-12 NOTE — Progress Notes (Signed)
Summary: Prior Authorization- Approval  Prior Authorization approved for Lyrica 23m one tablet twice daily approved for the lifetime of the patient ob the plan. Patient and pharmacy notified....................................................................Marland KitchenAntony Blackbird December 23, 2006 5:16 PM

## 2010-03-12 NOTE — Progress Notes (Signed)
Summary: refill/ hla  Phone Note Refill Request Message from:  Fax from Pharmacy on December 20, 2008 4:24 PM  Refills Requested: Medication #1:  FERROUS SULFATE 325 (65 FE) MG  TBEC Take 1 tablet by mouth three times a day Initial call taken by: Freddy Finner RN,  December 20, 2008 4:25 PM  Follow-up for Phone Call        Patient apparantly had iron deficiency anemia 18 months ago with negative GI eval (including capsule endoscopy).  All CBCs in 2010 normal.  Needs visit with focus on this problem.  Would stop iron now and re-evaluate. Follow-up by: Milta Deiters MD,  December 20, 2008 4:55 PM  Additional Follow-up for Phone Call Additional follow up Details #1::        Rx called to pharmacy Additional Follow-up by: Freddy Finner RN,  December 21, 2008 4:44 PM

## 2010-03-12 NOTE — Progress Notes (Signed)
Summary: Refill/gh  Phone Note Refill Request Message from:  Patient on Jul 02, 2007 12:16 PM  Refills Requested: Medication #1:  HYDROCHLOROTHIAZIDE 25 MG TABS Take 1 tablet by mouth once a day  Method Requested: Electronic Initial call taken by: Sander Nephew RN,  Jul 02, 2007 12:16 PM  Follow-up for Phone Call        Refill approved-nurse to complete Follow-up by: Lucy Chris MD,  Jul 06, 2007 9:52 AM      Prescriptions: FERROUS SULFATE 325 (65 FE) MG  TBEC (FERROUS SULFATE) Take 1 tablet by mouth three times a day  #93 x 2   Entered and Authorized by:   Lucy Chris MD   Signed by:   Lucy Chris MD on 07/06/2007   Method used:   Electronically sent to ...       [place], [place]  [address]       Ph: [phone]       Fax: [phone]   RxID:   [id number] GLIPIZIDE XL 10 MG  TB24 (GLIPIZIDE) Take 2 tablet by mouth once a day  #62 x 2   Entered and Authorized by:   Lucy Chris MD   Signed by:   Lucy Chris MD on 07/06/2007   Method used:   Electronically sent to ...       [facility] [place], [place]  [address]       Ph: [phone]       Fax: [phone]   RxID:   [id number] CYMBALTA 30 MG CPEP (DULOXETINE HCL) Take 3  tables by mouth once daily  #90 x 3   Entered and Authorized by:   Lucy Chris MD   Signed by:   Lucy Chris MD on 07/06/2007   Method used:   Electronically sent to ...       [facility] [place], [place]  [address]       Ph: [phone]       Fax: [phone]   RxID:   [id number] GLUCOPHAGE XR 500 MG TB24 (METFORMIN HCL) 2 pills in AM, 2 pills in PM  #120 x 11   Entered and Authorized by:   Lucy Chris MD   Signed by:   Lucy Chris MD on 07/06/2007   Method used:   Electronically sent to ...       [facility] [place], [place]  [address]       Ph: [phone]       Fax:  [phone]   RxID:   [id number] ADVAIR DISKUS 250-50 MCG/DOSE MISC (FLUTICASONE-SALMETEROL) 1 puff twice daily  #1 diskus x 5   Entered and Authorized by:   Lucy Chris MD   Signed by:   Lucy Chris MD on 07/06/2007   Method used:   Electronically sent to ...       [facility] [place], [place]  [address]       Ph: [phone]       Fax: [phone]   RxID:   [id number] CRESTOR 20 MG  TABS (ROSUVASTATIN CALCIUM) Take 1 tablet  by mouth once a day  #31 x 5   Entered and Authorized by:   Lucy Chris MD   Signed by:   Lucy Chris MD on 07/06/2007   Method used:   Electronically sent to ...       [facility] [place], [place]  [address]       Ph: [phone]       Fax: [phone]   RxID:   [id number] SPIRIVA HANDIHALER 18 MCG CAPS (TIOTROPIUM BROMIDE MONOHYDRATE) Inhale contents of 1 capsule once a day  #1 x 5   Entered and Authorized by:   Lucy Chris MD   Signed by:   Lucy Chris MD on 07/06/2007   Method used:   Electronically sent to ...       [facility] [place], [place]  [address]       Ph: [phone]       Fax: [phone]   RxID:   [id number] HYDROCHLOROTHIAZIDE 25 MG TABS (HYDROCHLOROTHIAZIDE) Take 1 tablet by mouth once a day  #31 x 5   Entered and Authorized by:   Lucy Chris MD   Signed by:   Lucy Chris MD on 07/06/2007   Method used:   Electronically sent to ...       [place], [place]  [address]       Ph: [phone]       Fax: [phone]   RxID:   [id number]

## 2010-03-12 NOTE — Progress Notes (Signed)
Summary: Needs an opinion about insulin  Phone Note Call from Patient Call back at Home Phone [phone]   Caller: Patient Details for Reason: Talk to Diabetes Educator Summary of Call: Needs and opinion, Wonders if she needs to go on insulin before she goes to [place] in a month? Doctor increased  my medications, but my sugar readings are horrible., should I give it more time?  Of note, has sweating after she eats for a while. Meter readings per patient: 14 day average 146 mg/dl,  30 day average 144 03/20/06- 142 this am 12:30 last night 173 2/6 9:24 ? am or pm 179 2/5 11:44-Pm 272, 175 fasting 2/4 10;56 Pm 227 2/1 9:33 am 131  1/28 6:40 am 152m/dl  Discussed that reported blood sugars are not alarming, but could be a bit better. Reviewed ADA and AACE goals for blood sugar and A1C and reviewed what her A1C was at her last visit 7.5% whihc is reasonable control. Suggested she check blood sugar 2 hours after her largest meal and if that was 140-180 mg/dl, she was doing well. Also suggested she could make therapeutic lifestyle change- such as walking 15-20 min/day to improve her control. She plans to follow-up with her doctor prior to going [place]  Initial call taken by: DBarnabas Harries  March 20, 2006 12:09 PM

## 2010-03-12 NOTE — Letter (Signed)
Summary: Generic Letter  [facility]  [address]   [place], [place] [address]   Phone: [phone]  Fax: [phone]    09/04/2009        [place], [place]   RE:  [name]    dob: 27-Jun-2050   To Whom It May Concern:  [name] cannot afford the Medicare copay relating to her CPAP machine.   Attached is our financial review confirming that [name] meets the poverty level, making only $1,073 per month from [employer].   She is currently in the hospital right now and so I am trying to assist her with this finacial dilemma.   As always, thank you so much for helping our patients!  Sincerely,   Katy Fitch, [place]

## 2010-03-12 NOTE — Progress Notes (Signed)
Summary: Refill/gh  Phone Note Refill Request Message from:  [place] on November 23, 2006 9:06 AM  Refills Requested: Medication #1:  ADVAIR DISKUS 250-50 MCG/DOSE MISC 1 puff twice daily   Last Refilled: 07/16/2006  Method Requested: Electronic Initial call taken by: Sander Nephew RN,  November 23, 2006 9:07 AM  Follow-up for Phone Call        She just got 5 refills less than 2 weeks ago.  Is this a mistake? Follow-up by: Lucy Chris MD,  November 23, 2006 9:44 AM  Additional Follow-up for Phone Call Additional follow up Details #1::        Rx faxed to pharmacy Additional Follow-up by: Sander Nephew RN,  November 23, 2006 4:26 PM

## 2010-03-12 NOTE — Progress Notes (Signed)
Summary: refill/ hla  Phone Note Refill Request Message from:  Fax from Pharmacy on May 24, 2009 4:05 PM  Refills Requested: Medication #1:  HYDROCODONE-IBUPROFEN 7.5-200 MG TABS Take one tab by mouth once every 6 hours as needed for pain   Dosage confirmed as above?Dosage Confirmed   Last Refilled: 3/10  Method Requested: Fax to [facility] Initial call taken by: Freddy Finner RN,  May 24, 2009 4:06 PM  Follow-up for Phone Call        completed refill, thank you Iskra  Follow-up by: Trinidad Curet MD,  May 24, 2009 4:31 PM    Prescriptions: HYDROCODONE-IBUPROFEN 7.5-200 MG TABS (HYDROCODONE-IBUPROFEN) Take one tab by mouth once every 6 hours as needed for pain  #120 x 0   Entered and Authorized by:   Trinidad Curet MD   Signed by:   Trinidad Curet MD on 05/24/2009   Method used:   Telephoned to ...       [facility] (mail-order)       [address] [place], [place]  [address]       Ph: [phone]       Fax: [phone]   RxID:   [id number]   Appended Document: refill/ hla Hydrocodone/Ibuprofen rx refill request faxed to [facility] .

## 2010-03-12 NOTE — Assessment & Plan Note (Signed)
Summary: SEVERE BACK PAIN/DS   Vital Signs:  Patient Profile:   60 Years Old Female Height:     64 inches (162.56 cm) Weight:      181.1 pounds (82.32 kg) BMI:     31.20 Temp:     96.9 degrees F Pulse rate:   87 / minute BP sitting:   132 / 65  (left arm)  Pt. in pain?   yes    Location:   lower right back    Intensity:   9  Vitals Entered By: Yvonna Alanis RN (July 28, 2007 8:49 AM)              Is Patient Diabetic? Yes  Nutritional Status BMI of > 30 = obese CBG Result 159  Have you ever been in a relationship where you felt threatened, hurt or afraid?No   Does patient need assistance? Functional Status Self care Ambulation Normal     PCP:  Lucy Chris MD  Chief Complaint:  Back Pain.  History of Present Illness: Right back pain, started yesterday. noticed when she got up. Took two vicodin, took the edge off but didn't help much. pain is non radiating, constant, often stabbing. no precipitating factor. no exacerbating factor, minimal relief with meds. 8-9 out of ten. No history of trauma to the back. No fever, but feels sweaty, which is normal for her. No problem with urination or problem with bowels like incontinence, generally has constipation. This pain is new to her. She has had a kidney stone, not sure which side. Had lithotripsy. No weakness or numbness on her extremities. Has fibromyalgia, diagnosed by a rheumatologist, doesn't recall the name.     Current Allergies: ! ULTRAM ! MORPHINE    Risk Factors:  Tobacco use:  current    Year started:  AT THE AGE OF 12    Cigarettes:  Yes -- 1.5 pack(s) per day    Counseled to quit/cut down tobacco use:  yes Alcohol use:  no Exercise:  no Seatbelt use:  100 %  PAP Smear History:    Date of Last PAP Smear:  06/29/2007   Review of Systems      See HPI   Physical Exam  General:     alert and well-developed.   Head:     normocephalic and atraumatic.   Eyes:     vision grossly intact.   Ears:   no external deformities.   Nose:     no external erythema.   Mouth:     pharynx pink and moist.   Neck:     supple.   Lungs:     normal respiratory effort and normal breath sounds.   Heart:     normal rate, regular rhythm, no murmur, no gallop, and no rub.   Abdomen:     soft, non-tender, and normal bowel sounds.   Msk:     Mild tenderness over lower back. No paraspinal spasm. SLRT 75 degrees bilaterally. Reflexes, power and sensation intact over lower extremities. No saddle area sensory loss.  Extremities:     No edema or calf swelling.  Neurologic:     alert & oriented X3, cranial nerves II-XII intact, strength normal in all extremities, and sensation intact to light touch.      Impression & Recommendations:  Problem # 1:  LOW BACK PAIN (ICD-724.2) Acute onset back pain. No alarm features like: weight loss, trauma, sensory loss, weakness/numbness, incontinence or fever. Will go ahead and treat symptomatically. Will  get x-ray LS spine to rule out spontaneous fracture secondary to osteoporosis. Will do a physiotherapy   Her updated medication list for this problem includes:    Aspir-low 81 Mg Tbec (Aspirin)    Vicodin 5-500 Mg Tabs (Hydrocodone-acetaminophen) .Marland Kitchen... Take one tablet by mouth every eight hours as needed for pain  Orders: Diagnostic X-Ray/Fluoroscopy (Diagnostic X-Ray/Flu) Physical Therapy Referral (PT)   Problem # 2:  FIBROMYALGIA (ICD-729.1) Will ask her to continue with present pain meds. Physio referral as above.   Her updated medication list for this problem includes:    Aspir-low 81 Mg Tbec (Aspirin)    Vicodin 5-500 Mg Tabs (Hydrocodone-acetaminophen) .Marland Kitchen... Take one tablet by mouth every eight hours as needed for pain   Problem # 3:  FACIAL FLUSHING (ICD-782.62) It has been previously planned that she get a 24 hour urine test for 5 HIAA. Will go ahead and order that today.   Orders: T-Urine 24 Hr. 5 HIAA ([id number])   Problem # 4:  TOBACCO  ABUSE (ICD-305.1) Patient is willing to get help from the social worker regarding smoking cessation counselling. I will make a social work referral today.   Orders: Social Work Referral (Social )   Complete Medication List: 1)  Prilosec Otc 20 Mg Tbec (Omeprazole magnesium) .... Take two tablets by mouth daily 2)  Alprazolam 0.5 Mg Tabs (Alprazolam) .... One pill daily as needed for anxiety 3)  Albuterol 90 Mcg/act Aers (Albuterol) .... Two puffs every four hours as needed for shortness of breath 4)  Hydrochlorothiazide 25 Mg Tabs (Hydrochlorothiazide) .... Take 1 tablet by mouth once a day 5)  Spiriva Handihaler 18 Mcg Caps (Tiotropium bromide monohydrate) .... Inhale contents of 1 capsule once a day 6)  Crestor 20 Mg Tabs (Rosuvastatin calcium) .... Take 1 tablet by mouth once a day 7)  Advair Diskus 250-50 Mcg/dose Misc (Fluticasone-salmeterol) .Marland Kitchen.. 1 puff twice daily 8)  Glucophage Xr 500 Mg Tb24 (Metformin hcl) .... 2 pills in am, 2 pills in pm 9)  Cymbalta 30 Mg Cpep (Duloxetine hcl) .... Take 3  tables by mouth once daily 10)  Aspir-low 81 Mg Tbec (Aspirin) 11)  Glipizide Xl 10 Mg Tb24 (Glipizide) .... Take 2 tablet by mouth once a day 12)  Vicodin 5-500 Mg Tabs (Hydrocodone-acetaminophen) .... Take one tablet by mouth every eight hours as needed for pain 13)  Loratadine 10 Mg Tabs (Loratadine) .... Take 1 tablet by mouth once a day 14)  Nystatin 100000 Unit/gm Crea (Nystatin) .... Apply to affected area two times a day. 15)  Ferrous Sulfate 325 (65 Fe) Mg Tbec (Ferrous sulfate) .... Take 1 tablet by mouth three times a day 16)  Topamax 25 Mg Tabs (Topiramate) .... Take 1 tablet by mouth twice a day  Other Orders: Capillary Blood Glucose (01601) Fingerstick (09323)   Patient Instructions: 1)  Please schedule a follow-up appointment in 2 weeks. 2)  You can take Vicodin 5/500 1-2 tab up to four times a day.  3)  Please take flexeril 10 mg, one tab before you go to bed. 4)  We  will check your x-ray results and let you know if there is anything wrong. 5)  Please feel free to contact us early if you have any concerns. 6)  Please contact us if you have numbness, weakness on your legs or problems with bowel/ bladder or incontinence.    ]

## 2010-03-12 NOTE — Progress Notes (Signed)
Summary: refill request/nls  Phone Note Refill Request  on December 17, 2006 9:39 AM  Refills Requested: Medication #1:  CYMBALTA 30 MG CPEP Take 3  tables by mouth once daily   Last Refilled: 11/09/2006  Method Requested: Fax to [facility] Initial call taken by: Antony Blackbird,  December 17, 2006 9:39 AM  Follow-up for Phone Call        Refill approved-nurse to complete Follow-up by: Lucy Chris MD,  December 17, 2006 9:54 AM  Additional Follow-up for Phone Call Additional follow up Details #1::        Rx called to pharmacy Additional Follow-up by: Antony Blackbird,  December 17, 2006 10:33 AM      Prescriptions: CYMBALTA 30 MG CPEP (DULOXETINE HCL) Take 3  tables by mouth once daily  #90 x 3   Entered and Authorized by:   Lucy Chris MD   Signed by:   Lucy Chris MD on 12/17/2006   Method used:   Electronically sent to ...       [place], [place]  [address]       Ph: [phone]       Fax: [phone]   RxID:   [id number]

## 2010-03-12 NOTE — Progress Notes (Signed)
Summary: Refill request/dms  Phone Note Refill Request Message from:  Fax from Pharmacy on Jun 30, 2006 10:33 AM  Refills Requested: Medication #1:  HYDROCHLOROTHIAZIDE 25 MG TABS Take 1 tablet by mouth once a day   Last Refilled: 06/02/2006  Method Requested: Fax to [facility] Initial call taken by: Pollyann Samples,  Jun 30, 2006 10:33 AM  Follow-up for Phone Call        Refill approved-nurse to complete Follow-up by: Luane School MD,  Jun 30, 2006 10:57 AM  Additional Follow-up for Phone Call Additional follow up Details #1::        Rx faxed to pharmacy Additional Follow-up by: Pollyann Samples,  Jun 30, 2006 11:17 AM    Prescriptions: HYDROCHLOROTHIAZIDE 25 MG TABS (HYDROCHLOROTHIAZIDE) Take 1 tablet by mouth once a day  #31 x 5   Entered and Authorized by:   Luane School MD   Signed by:   Luane School MD on 06/30/2006   Method used:   Telephoned to ...       [id number]      [address] [place]  [address] [place]      Ph: [phone]      Fax: [phone]  RxID:   [id number]

## 2010-03-12 NOTE — Assessment & Plan Note (Signed)
Summary: leg pain/gg   Vital Signs:  Patient Profile:   61 Years Old Female Height:     64 inches (162.56 cm) Weight:      182.9 pounds (83.14 kg) BMI:     31.51 Pulse rate:   84 / minute BP sitting:   121 / 76  (left arm) Cuff size:   regular  Pt. in pain?   yes    Location:   LEFT LEG    Intensity:   10    Type:       aching  Vitals Entered By: [name] (January 05, 2007 11:43 AM)              Is Patient Diabetic? Yes  Nutritional Status NORMAL  Have you ever been in a relationship where you felt threatened, hurt or afraid?No   Does patient need assistance? Functional Status Self care Ambulation Normal     PCP:  Kathryn Chris MD  Chief Complaint:  LEFT LEG PAIN SINCE TWO DAYS AGO / MEDICATION REFILL / .  History of Present Illness: [name] came today for a f/u visit. She is taking Lyrica since her last visit. She had some "terrible side effects" after taking Lyrica. She had some suicidal thoughts, but she says she is not that type of person and never had that feeling before taking lyrica. She also had a transient memory loss and dry mouth and some constipation. These latter side effects are not that bothering to her.   She also had sore everywhere. She also had cramping on the side of the side of the left leg. She has cramping on the back of the legs before but not on the side. She even saw a knot on the side of the leg on the mirror and after massage it's gone. She is concerned if she has a blood clot or a knot from the muscle. She doesn't recall having a recent surgery or a long distance travel. But, she is not very ambulatory except walking in the house from rooms to rooms or taking care of her dog. No SOB/chest pain or fever. No swelling of the ankles.   She is sore everywhere in the body.   Current Allergies: ! ULTRAM ! MORPHINE    Risk Factors:  Tobacco use:  current    Year started:  AT THE AGE OF 12    Cigarettes:  Yes -- 1 pack(s) per  day    Counseled to quit/cut down tobacco use:  yes Alcohol use:  no Exercise:  no Seatbelt use:  100 %   Review of Systems  General      Denies chills, fever, and sweats.  CV      Complains of chest pain or discomfort and leg cramps with exertion.      Denies shortness of breath with exertion and swelling of feet.  Resp      Denies cough.  Neuro      Complains of headaches and numbness.  Psych      Complains of anxiety and suicidal thoughts/plans.   Physical Exam  General:     alert.   Mouth:     pharynx pink and moist.   Lungs:     normal breath sounds, no crackles, and no wheezes.   Heart:     normal rate, regular rhythm, no murmur, no gallop, and no rub.   Pulses:     L posterior tibial decreased and L dorsalis pedis decreased.   Extremities:  NO pitting edema of either of the legs. No swelling or mass on the legs. But tenderness to touch as it is anywhere on her body. She is tender to touch on the calf and also on dorsiflexion of the foot. On palpation there is some chord like feeling at the calf. Neurologic:     alert & oriented X3.   Psych:     memory intact for recent and remote, normally interactive, good eye contact, not anxious appearing, not depressed appearing, and not agitated.      Impression & Recommendations:  Problem # 1:  LEG PAIN (ICD-729.5) [name] had a leg pain and she also felt a knot that disappeared after she massaged it. No leg swelling, chest pain, fever or shortness of breath. No recent h/o prolonged immobilization like surgery or prolonged travel. P/e no swelling, but tender and a chord like feeling on the calf.  After discussing the case with Dr. Sharlet Salina, we don't feel very comfortable send her home without ruling out DVT, especially given the fact that there was chord like bulge on palpation. I will get a venous doppler. In the mean time I will get a CK level as she is on statins and she is not having any myositis. Her doppler  is negative for DVT and her CK is normal. I will continue to treat her depression and fibromyalgia and now I assume the pain is part of the generalized pains she has from fibromyalgia. Orders: LE Venous Duplex (DVT) (DVT) T-Comprehensive Metabolic Panel ([id number]) T-CK Total ([id number])   Problem # 2:  FIBROMYALGIA (ICD-729.1) She had some dryness of her mouth and constipation after she was on Lyrica and she thinks this is not that bothersome for her. What actually bothers her is the fact that she had a transient memory loss and she missed a dinner with her sister and she once had a suicidal feeling. But she endorses that she is generally not suicidal and even after having that feeling she no more has that feeling. For sure, there is no plan. So to help with the pains I will continue the Cymbalta at the present dose. I will also start her on Flexeril. I asked her not to take alprazolam initially as flexeril will help her with sleep. I will stop Lyrica for now. I will see her in 2 weeks from now. In the mean time, I asked her to take vicodin as needed.  Her updated medication list for this problem includes:    Aspir-low 81 Mg Tbec (Aspirin)    Flexeril 10 Mg Tabs (Cyclobenzaprine hcl) .Marland Kitchen... Take 1 pill by mouth daily at bedtime.    Vicodin 5-500 Mg Tabs (Hydrocodone-acetaminophen) .Marland Kitchen... Take one tablet by mouth every eight hours as needed for pain   Problem # 3:  LOW BACK PAIN (ICD-724.2) Will see how flexeril will help her. Will f/u in 2 wks.  Her updated medication list for this problem includes:    Aspir-low 81 Mg Tbec (Aspirin)    Flexeril 10 Mg Tabs (Cyclobenzaprine hcl) .Marland Kitchen... Take 1 pill by mouth daily at bedtime.    Vicodin 5-500 Mg Tabs (Hydrocodone-acetaminophen) .Marland Kitchen... Take one tablet by mouth every eight hours as needed for pain   Problem # 4:  TOBACCO ABUSE (ICD-305.1) She doesn't want to quit smoking at present.   Problem # 5:  DEPRESSION (ICD-311) She had one episode of  suicidal ideas but no plan. She endorses that she doesn't have it anymore. No plan for sure. She  was assuming it as a side effect from lyrica. I think it can also be from cymbalta. But [name] thinks she is on Cymbalta for a long time and on 9/08 the dose was increased. So now, I am not sure if the suicidal thoughts was a side effect of lyrica or cymbalta. But it is no more present. I will d/c her lyrica for now and continue her cymbalta. I asked her to call the clinic or resident on call if she continue to have persistent suicidal thoughts. I also asked her to stop taking Cymbalta then.  Her updated medication list for this problem includes:    Alprazolam 0.5 Mg Tabs (Alprazolam) ..... One pill daily as needed for anxiety    Cymbalta 30 Mg Cpep (Duloxetine hcl) .Marland Kitchen... Take 3  tables by mouth once daily   Problem # 6:  LOW BACK PAIN (ICD-724.2) I think flexeril will help her with low back pain. I will reasses it in the future.  Her updated medication list for this problem includes:    Aspir-low 81 Mg Tbec (Aspirin)    Flexeril 10 Mg Tabs (Cyclobenzaprine hcl) .Marland Kitchen... Take 1 pill by mouth daily at bedtime.    Vicodin 5-500 Mg Tabs (Hydrocodone-acetaminophen) .Marland Kitchen... Take one tablet by mouth every eight hours as needed for pain   Complete Medication List: 1)  Prilosec 40 Mg Cpdr (Omeprazole) .... Take 1 capsule by mouth once a day 2)  Alprazolam 0.5 Mg Tabs (Alprazolam) .... One pill daily as needed for anxiety 3)  Albuterol 90 Mcg/act Aers (Albuterol) .... P.r.n wheezing 4)  Hydrochlorothiazide 25 Mg Tabs (Hydrochlorothiazide) .... Take 1 tablet by mouth once a day 5)  Spiriva Handihaler 18 Mcg Caps (Tiotropium bromide monohydrate) .... Inhale contents of 1 capsule once a day 6)  Lipitor 20 Mg Tabs (Atorvastatin calcium) .... Take 1 tablet by mouth once a day 7)  Fosamax 70 Mg Tabs (Alendronate sodium) .... Take one tablet weekly 8)  Januvia 100 Mg Tabs (Sitagliptin phosphate) .... Take 1 tablet  by mouth once a day 9)  Advair Diskus 250-50 Mcg/dose Misc (Fluticasone-salmeterol) .Marland Kitchen.. 1 puff twice daily 10)  Glucophage Xr 500 Mg Tb24 (Metformin hcl) .... 2 pills in am, 2 pills in pm 11)  Cymbalta 30 Mg Cpep (Duloxetine hcl) .... Take 3  tables by mouth once daily 12)  Claritin 10 Mg Tabs (Loratadine) .... Once daily tab as needed for itching. 13)  Aspir-low 81 Mg Tbec (Aspirin) 14)  Flexeril 10 Mg Tabs (Cyclobenzaprine hcl) .... Take 1 pill by mouth daily at bedtime. 15)  Glipizide 5 Mg Tabs (Glipizide) .... 1/2 pill twice daily with meals 16)  Vicodin 5-500 Mg Tabs (Hydrocodone-acetaminophen) .... Take one tablet by mouth every eight hours as needed for pain   Patient Instructions: 1)  If you have any persistent suicidal thoughts stop taking cymbalta, call the clinic or the resident on call and come to the hospital.  2)  Please schedule a follow-up appointment in 2 months. 3)  Tobacco is very bad for your health and your loved ones! You Should stop smoking!. 4)  Stop Smoking Tips: Choose a Quit date. Cut down before the Quit date. decide what you will do as a substitute when you feel the urge to smoke(gum,toothpick,exercise). 5)  It is important that you exercise regularly at least 20 minutes 5 times a week. If you develop chest pain, have severe difficulty breathing, or feel very tired , stop exercising immediately and seek medical attention.  6)  You need to lose weight. Consider a lower calorie diet and regular exercise.  7)  Please schedule a follow-up appointment in 2 weeks.    Prescriptions: HYDROCHLOROTHIAZIDE 25 MG TABS (HYDROCHLOROTHIAZIDE) Take 1 tablet by mouth once a day  #31 x 5   Entered and Authorized by:   Flo Shanks MD   Signed by:   Flo Shanks MD on 01/05/2007   Method used:   Print then Give to Patient   RxID:   [id number] FLEXERIL 10 MG  TABS (CYCLOBENZAPRINE HCL) take 1 pill by mouth daily at bedtime.  #31 x 3   Entered and Authorized by:    Flo Shanks MD   Signed by:   Flo Shanks MD on 01/05/2007   Method used:   Print then Give to Patient   RxID:   [id number] GLIPIZIDE 5 MG TABS (GLIPIZIDE) 1/2 pill twice daily with meals  #62 x 12   Entered and Authorized by:   Flo Shanks MD   Signed by:   Flo Shanks MD on 01/05/2007   Method used:   Print then Give to Patient   RxID:   [id number]  ]  Appended Document: leg pain/gg If patient continues to have suicidality on Cymbalta, the dose should probably not be stopped abrupty, but should be tapered. Unclear if symptoms related since patient has been on Cymbalta for sometime.

## 2010-03-12 NOTE — Progress Notes (Signed)
Summary: Prior Authorization- Cymbalta  patient plan request prior authorization for the Cymbalta 70m three daily. Called  her insurance company  Medication approved for the duration of patient's particpation in the program. [employer]...................................................................Marland KitchenAntony Blackbird November 09, 2006 11:37 AM

## 2010-03-12 NOTE — Progress Notes (Signed)
Summary: refill request/ nls  Phone Note Refill Request  on February 03, 2007 9:55 AM  Refills Requested: Medication #1:  PRILOSEC 40 MG CPDR Take 1 capsule by mouth once a day   Last Refilled: 01/04/2007  Medication #2:  LIPITOR 20 MG TABS Take 1 tablet by mouth once a day   Last Refilled: 01/04/2007  Method Requested: Fax to [facility] Initial call taken by: Antony Blackbird,  February 03, 2007 9:55 AM  Follow-up for Phone Call        Already refilled electronically.  Follow-up by: Bertha Stakes MD,  February 03, 2007 10:10 AM

## 2010-03-12 NOTE — Progress Notes (Signed)
Summary: refill/ hla  Phone Note Refill Request Message from:  Fax from Pharmacy on April 12, 2007 9:24 PM  Refills Requested: Medication #1:  VICODIN 5-500 MG  TABS Take one tablet by mouth every eight hours as needed for pain   Last Refilled: 12/30 Initial call taken by: Freddy Finner RN,  April 12, 2007 9:25 PM  Follow-up for Phone Call        Refill approved-nurse to complete Follow-up by: Lucy Chris MD,  April 13, 2007 10:48 AM  Additional Follow-up for Phone Call Additional follow up Details #1::        Rx faxed to pharmacy Additional Follow-up by: Freddy Finner RN,  April 13, 2007 1:25 PM      Prescriptions: VICODIN 5-500 MG  TABS (HYDROCODONE-ACETAMINOPHEN) Take one tablet by mouth every eight hours as needed for pain  #90 x 5   Entered and Authorized by:   Lucy Chris MD   Signed by:   Lucy Chris MD on 04/13/2007   Method used:   Telephoned to ...       [place], [place]  [address]       Ph: [phone]       Fax: [phone]   RxID:   [id number]

## 2010-03-12 NOTE — Progress Notes (Signed)
Summary: refill/ hla  Phone Note Refill Request Message from:  Patient on April 18, 2009 3:51 PM  Refills Requested: Medication #1:  HYDROCODONE-IBUPROFEN 7.5-200 MG TABS Take one tab by mouth once every 6 hours as needed for pain   Last Refilled: 2/16  Medication #2:  nystatin cream 100000 as needed   Last Refilled: 04/2008 nystatin was removed 04/2008, pt states she likes to have it on hand when she has a problem r/t diab  Initial call taken by: Freddy Finner RN,  April 18, 2009 3:54 PM  Follow-up for Phone Call        completed, called in pharmacy Follow-up by: Trinidad Curet MD,  April 19, 2009 1:43 PM    New/Updated Medications: NYSTATIN 100000 UNIT/GM CREA (NYSTATIN) apply to the affected area 3-4 times daily as needed Prescriptions: NYSTATIN 100000 UNIT/GM CREA (NYSTATIN) apply to the affected area 3-4 times daily as needed  #1 x 5   Entered and Authorized by:   Trinidad Curet MD   Signed by:   Trinidad Curet MD on 04/19/2009   Method used:   Telephoned to ...       [facility] (mail-order)       [address] [place], [place]  [address]       Ph: [phone]       Fax: [phone]   RxID:   [id number] HYDROCODONE-IBUPROFEN 7.5-200 MG TABS (HYDROCODONE-IBUPROFEN) Take one tab by mouth once every 6 hours as needed for pain  #120 x 0   Entered and Authorized by:   Trinidad Curet MD   Signed by:   Trinidad Curet MD on 04/19/2009   Method used:   Telephoned to ...       [facility] (mail-order)       [address] [place], [place]  [address]       Ph: [phone]       Fax: [phone]   [place]:   [phone]

## 2010-03-12 NOTE — Letter (Signed)
Summary: Handout Printed  Printed Handout:  - *Patient Instructions

## 2010-03-12 NOTE — Progress Notes (Signed)
Summary: Prior Authorization- Approval  Prior authorization request for Cymbalta 69m and Pilosec approved. The Cymbalta 324mapproved for 3 years, the Prilosec needs to be called in as Prilosec OTC 2070mwo tablets once daily at a zero copay....................................................................NaMarland KitchenAntony Blackbirdebruary 11, 2009 4:08 PM

## 2010-03-12 NOTE — Assessment & Plan Note (Signed)
Summary: est-ck/fu/meds/cfb   Vital Signs:  Patient Profile:   60 Years Old Female Height:     64 inches (162.56 cm) Weight:      181.2 pounds (82.36 kg) BMI:     31.22 Temp:     97.0 degrees F (36.11 degrees C) oral Pulse rate:   98 / minute BP sitting:   121 / 66  (right arm)  Pt. in pain?   yes    Location:   bilateral arm/leg pain    Intensity:   8  Vitals Entered By: Nadine Counts [name]) (February 18, 2007 3:12 PM)              Is Patient Diabetic? No Nutritional Status BMI of > 30 = obese  Have you ever been in a relationship where you felt threatened, hurt or afraid?No   Does patient need assistance? Functional Status Self care Ambulation Normal     PCP:  Lucy Chris MD  Chief Complaint:  pt c/o bilateral leg/arm pain .  History of Present Illness: Ms. Basley is a 60 yo woman with PMH as noted who presents for routine follow-up.  She reports that since seeing me last, she has stopped lyrica because of a negative reaction.  Her pain has been unchanged.  She continues to report depressive symptoms without relief with Cymbalta.  No suicidal ideation.    Current Allergies (reviewed today): ! ULTRAM ! MORPHINE    Risk Factors:  Tobacco use:  current    Year started:  AT THE AGE OF 12    Cigarettes:  Yes -- 1.5 pack(s) per day    Counseled to quit/cut down tobacco use:  yes Alcohol use:  no Exercise:  no Seatbelt use:  100 %   Review of Systems  General      Denies chills, fever, weakness, and weight loss.  CV      Denies chest pain or discomfort, palpitations, and shortness of breath with exertion.  GI      Denies abdominal pain, bloody stools, change in bowel habits, and dark tarry stools.   Physical Exam  General:     alert and well-developed.  NAD. Lungs:     normal respiratory effort and normal breath sounds.   Heart:     normal rate, regular rhythm, and no murmur.   Abdomen:     soft, non-tender, and normal bowel sounds.     Extremities:     No edema Psych:     labile affect and poor concentration.      Impression & Recommendations:  Problem # 1:  GERD (ICD-530.81) Ms. Olarte states that other medications including generic omeprazole do not control her symptoms, but brand name Prilosec does.  Prescription written for Prilosec DAW.  Her updated medication list for this problem includes:    Prilosec 40 Mg Cpdr (Omeprazole) .Marland Kitchen... Take 1 capsule by mouth once a day   Problem # 2:  AODM (ICD-250.00) Refill metformin.  Of note, she is on 2g of metformin extended release, but is taking this two times a day.  Instructed patient that this can be taken once daily.  At next visit, will discuss increasing glipizide and discontinuing Januvia.    Her updated medication list for this problem includes:    Januvia 100 Mg Tabs (Sitagliptin phosphate) .Marland Kitchen... Take 1 tablet by mouth once a day    Glucophage Xr 500 Mg Tb24 (Metformin hcl) .Marland Kitchen... 2 pills in am, 2 pills in pm  Aspir-low 81 Mg Tbec (Aspirin)    Glipizide 5 Mg Tabs (Glipizide) .Marland Kitchen... 1/2 pill twice daily with meals   Problem # 3:  FIBROMYALGIA (ICD-729.1) Given depressive symptoms, will d/c Flexeril and start Imipramine. Patient takes Vicodin twice a day, and I am comfortable continuing this for now.  WIll follow-up in one month.    Complete Medication List: 1)  Prilosec 40 Mg Cpdr (Omeprazole) .... Take 1 capsule by mouth once a day 2)  Alprazolam 0.5 Mg Tabs (Alprazolam) .... One pill daily as needed for anxiety 3)  Albuterol 90 Mcg/act Aers (Albuterol) .... P.r.n wheezing 4)  Hydrochlorothiazide 25 Mg Tabs (Hydrochlorothiazide) .... Take 1 tablet by mouth once a day 5)  Spiriva Handihaler 18 Mcg Caps (Tiotropium bromide monohydrate) .... Inhale contents of 1 capsule once a day 6)  Lipitor 20 Mg Tabs (Atorvastatin calcium) .... Take 1 tablet by mouth once a day 7)  Fosamax 70 Mg Tabs (Alendronate sodium) .... Take one tablet weekly 8)  Januvia 100 Mg Tabs  (Sitagliptin phosphate) .... Take 1 tablet by mouth once a day 9)  Advair Diskus 250-50 Mcg/dose Misc (Fluticasone-salmeterol) .Marland Kitchen.. 1 puff twice daily 10)  Glucophage Xr 500 Mg Tb24 (Metformin hcl) .... 2 pills in am, 2 pills in pm 11)  Cymbalta 30 Mg Cpep (Duloxetine hcl) .... Take 3  tables by mouth once daily 12)  Aspir-low 81 Mg Tbec (Aspirin) 13)  Glipizide 5 Mg Tabs (Glipizide) .... 1/2 pill twice daily with meals 14)  Vicodin 5-500 Mg Tabs (Hydrocodone-acetaminophen) .... Take one tablet by mouth every eight hours as needed for pain 15)  Imipramine Hcl 50 Mg Tabs (Imipramine hcl) .... Take 1/2 tablet by mouth at night   Patient Instructions: 1)  Please schedule a follow-up appointment in 1 month to see how the Imipramine is working    Prescriptions: ALPRAZOLAM 0.5 MG  TABS (ALPRAZOLAM) one pill daily as needed for anxiety  #100 x 5   Entered and Authorized by:   Lucy Chris MD   Signed by:   Lucy Chris MD on 02/18/2007   Method used:   Handwritten   RxID:   [id number] GLUCOPHAGE XR 500 MG TB24 (METFORMIN HCL) 2 pills in AM, 2 pills in PM  #120 x 11   Entered and Authorized by:   Lucy Chris MD   Signed by:   Lucy Chris MD on 02/18/2007   Method used:   Electronically sent to ...       [facility], [place]  [address]       Ph: [phone]       Fax: [phone]   RxID:   [id number]  ]  Appended Document: est-ck/fu/meds/cfb Charge coding for this visit should have been entered as Est Pt Level III for fibromyalgia and depression.

## 2010-03-12 NOTE — Progress Notes (Signed)
Summary: brokentooth/increasing lantus/dmr  Phone Note Call from Patient Call back at Home Phone [phone]   Caller: Patient Summary of Call: broke another tooth wants to know if this can be related to her diabetes. Discussed relationship of dental and peridontal disease and diabetes with patient and encouraged appropriate care to prevent infection.Marland Kitchen   up to 22 units of lantus, Blood sugar was 180 this am, most fasting blood sugars in this range per Shaddai. going to increase lantus to 23 tonight. Initial call taken by: Barnabas Harries RD,CDE,  May 31, 2008 12:38 PM    New/Updated Medications: LANTUS 100 UNIT/ML SOLN (INSULIN GLARGINE) Inject 22 units subcutaneously daily and increase until fasting blood sugars are mostly less than 130 mg/dl

## 2010-03-12 NOTE — Progress Notes (Signed)
Summary: refill/ hla  Phone Note Refill Request Message from:  Pharmacy on September 19, 2009 10:49 AM  Refills Requested: Medication #1:  ALPRAZOLAM 0.5 MG  TABS one pill daily as needed for anxiety   Dosage confirmed as above?Dosage Confirmed   Last Refilled: 7/7 Initial call taken by: Freddy Finner RN,  September 19, 2009 10:49 AM  Follow-up for Phone Call        Refill approved-nurse to complete thank you iskra Follow-up by: Trinidad Curet MD,  September 20, 2009 3:35 PM    Prescriptions: ALPRAZOLAM 0.5 MG  TABS (ALPRAZOLAM) one pill daily as needed for anxiety  #30 x 5   Entered and Authorized by:   Trinidad Curet MD   Signed by:   Trinidad Curet MD on 09/20/2009   Method used:   Historical   RxID:   [id number]   Appended Document: refill/ hla called to pharm

## 2010-03-12 NOTE — Progress Notes (Signed)
Summary: refill/hla  Phone Note Refill Request Message from:  Fax from Pharmacy on May 13, 2006 9:30 AM  Refills Requested: Medication #1:  FOSAMAX 70 MG TABS weekly  Initial call taken by: Freddy Finner RN,  May 13, 2006 9:31 AM  Follow-up for Phone Call        Refill approved-nurse to complete Follow-up by: Luane School MD,  May 13, 2006 10:31 AM  Additional Follow-up for Phone Call Additional follow up Details #1::        Rx faxed to pharmacy Additional Follow-up by: Freddy Finner RN,  May 13, 2006 11:33 AM  New/Updated Medications: FOSAMAX 70 MG TABS (ALENDRONATE SODIUM) Take one tablet weekly  New/Updated Medications: FOSAMAX 70 MG TABS (ALENDRONATE SODIUM) Take one tablet weekly  Prescriptions: FOSAMAX 70 MG TABS (ALENDRONATE SODIUM) Take one tablet weekly  #4 x 3   Entered and Authorized by:   Luane School MD   Signed by:   Luane School MD on 05/13/2006   Method used:   Telephoned to ...       [id number]      [address]  [address] [place]      Ph: [phone]      Fax: [phone]  RxID:   [id number]

## 2010-03-12 NOTE — Miscellaneous (Signed)
Summary: obesity code  Clinical Lists Changes  Problems: Added new problem of OBESITY NOS (ICD-278.00) - BMI 31

## 2010-03-12 NOTE — Assessment & Plan Note (Signed)
Summary: 16MONTH FU/EST/VS   Vital Signs:  Patient profile:   60 year old female Height:      64 inches Weight:      174.6 pounds BMI:     30.08 Temp:     97.6 degrees F oral Pulse rate:   91 / minute BP sitting:   140 / 76  (right arm)  Vitals Entered By: Silverio Decamp NT II (Jun 15, 2008 11:17 AM) CC: checkup Is Patient Diabetic? Yes  Nutritional Status BMI of > 30 = obese  Have you ever been in a relationship where you felt threatened, hurt or afraid?No   Does patient need assistance? Functional Status Self care Ambulation Normal   Primary Care Provider:  Lucy Chris MD  CC:  checkup.  History of Present Illness: [name] is a 60 yo woman with extensive PMH as noted who presents today for routine follow-up but reports   1.  Taking 30 units of Lantus daily now with CBG's ranging from 79-283 2.  Wants #100 Alprazolam for cost reasons.  Taking one daily as needed.  3.  Postprandial flushing has increased markedly.  No other related symptoms.    Preventive Screening-Counseling & Management     Smoking Status: current     Smoking Cessation Counseling: yes     Packs/Day: 1.5     Year Started: AT THE AGE OF 12     Does Patient Exercise: no  Current Medications (verified): 1)  Prilosec Otc 20 Mg  Tbec (Omeprazole Magnesium) .... Take Two Tablets By Mouth Daily 2)  Alprazolam 0.5 Mg  Tabs (Alprazolam) .... One Pill Daily As Needed For Anxiety 3)  Albuterol 90 Mcg/act Aers (Albuterol) .... Two Puffs Every Four Hours As Needed For Shortness of Breath 4)  Hydrochlorothiazide 25 Mg Tabs (Hydrochlorothiazide) .... Take 1 Tablet By Mouth Once A Day 5)  Spiriva Handihaler 18 Mcg Caps (Tiotropium Bromide Monohydrate) .... Inhale Contents of 1 Capsule Once A Day 6)  Crestor 20 Mg  Tabs (Rosuvastatin Calcium) .... Take 1 Tablet By Mouth Once A Day 7)  Advair Diskus 250-50 Mcg/dose Misc (Fluticasone-Salmeterol) .Marland Kitchen.. 1 Puff Twice Daily 8)  Glucophage Xr 500 Mg Tb24 (Metformin Hcl)  .... 2 Pills in Am, 2 Pills in Pm 9)  Cymbalta 30 Mg Cpep (Duloxetine Hcl) .... Take 3  Tables By Mouth Once Daily 10)  Aspir-Low 81 Mg Tbec (Aspirin) 11)  Glipizide Xl 10 Mg  Tb24 (Glipizide) .... Take 1 Tablet By Mouth Twice A Day 12)  Hydrocodone-Ibuprofen 7.5-200 Mg Tabs (Hydrocodone-Ibuprofen) .... Take One Tab By Mouth Once Every 6 Hours As Needed For Pain 13)  Loratadine 10 Mg  Tabs (Loratadine) .... Take 1 Tablet By Mouth Once A Day 14)  Ferrous Sulfate 325 (65 Fe) Mg  Tbec (Ferrous Sulfate) .... Take 1 Tablet By Mouth Three Times A Day 15)  Lantus 100 Unit/ml Soln (Insulin Glargine) .... Inject 22 Units Subcutaneously Daily and Increase Until Fasting Blood Sugars Are Mostly Less Than 130 Mg/dl 16)  Onetouch Ultra Test  Strp (Glucose Blood) .... Use To Test Blood Sugars Three Times A Day 17)  Insulin Syringe 31g X 5/16" 0.3 Ml Misc (Insulin Syringe-Needle U-100) .... Use To Inject Lantus Insulin Once Daily 18)  Novolog 100 Unit/ml Soln (Insulin Aspart) .... Inject 5 Units With Each Meal.  Allergies: 1)  ! Ultram 2)  ! Morphine  Review of Systems General:  Denies chills and fever. CV:  Denies chest pain or discomfort. GI:  Complains of diarrhea; denies bloody stools, constipation, indigestion, nausea, and vomiting.  Physical Exam  General:  alert and overweight-appearing.  NAD Lungs:  normal respiratory effort, no accessory muscle use, normal breath sounds, no crackles, and no wheezes.   Heart:  normal rate, regular rhythm, no murmur, no gallop, and no rub.   Abdomen:  soft, non-tender, normal bowel sounds, and no distention.   Extremities:  No LE edema, no joint effusions, no joints with increased calor   Impression & Recommendations:  Problem # 1:  FACIAL FLUSHING (ICD-782.62) Given marked increase in symptoms, will recheck a 24 hour urine for 5-HIAA.   Orders: T- * Misc. Laboratory test [phone])  Problem # 2:  AODM (ICD-250.00)  Repeat BMET ordered for mild anion gap  on last labs.   Will start Meal coverage insulin and have pt see Barnabas Harries for further assistance.    Her updated medication list for this problem includes:    Glucophage Xr 500 Mg Tb24 (Metformin hcl) .Marland Kitchen... 2 pills in am, 2 pills in pm    Aspir-low 81 Mg Tbec (Aspirin)    Glipizide Xl 10 Mg Tb24 (Glipizide) .Marland Kitchen... Take 1 tablet by mouth twice a day    Lantus 100 Unit/ml Soln (Insulin glargine) ..... Inject 30 units subcutaneously daily and increase until fasting blood sugars are mostly less than 130 mg/dl    Novolog 100 Unit/ml Soln (Insulin aspart) ..... Inject 5 units with each meal.  Orders: T-Basic Metabolic Panel ([id number])  Problem # 3:  HYPERTENSION (ICD-401.9) BP above goal given diabetes, but previously low.  Will recheck at next visit.   Her updated medication list for this problem includes:    Hydrochlorothiazide 25 Mg Tabs (Hydrochlorothiazide) .Marland Kitchen... Take 1 tablet by mouth once a day  BP today: 140/76 Prior BP: 113/78 (04/18/2008)  Labs Reviewed: K+: 3.8 (04/18/2008) Creat: : 0.77 (04/18/2008)   Chol: 127 (04/18/2008)   HDL: 32 (04/18/2008)   LDL: 51 (04/18/2008)   TG: 222 (04/18/2008)  Problem # 4:  HYPERTRIGLYCERIDEMIA (ICD-272.1) Need to repeat a lipid panel at next visit as most recent clinic values are from February.   Her updated medication list for this problem includes:    Crestor 20 Mg Tabs (Rosuvastatin calcium) .Marland Kitchen... Take 1 tablet by mouth once a day  Labs Reviewed: SGOT: 23 (04/18/2008)   SGPT: 26 (04/18/2008)   HDL:32 (04/18/2008), 35 (03/24/2007)  LDL:51 (04/18/2008), 67 (03/24/2007)  Chol:127 (04/18/2008), 128 (03/24/2007)  Trig:222 (04/18/2008), 129 (03/24/2007)  Complete Medication List: 1)  Prilosec Otc 20 Mg Tbec (Omeprazole magnesium) .... Take two tablets by mouth daily 2)  Alprazolam 0.5 Mg Tabs (Alprazolam) .... One pill daily as needed for anxiety 3)  Albuterol 90 Mcg/act Aers (Albuterol) .... Two puffs every four hours as needed for  shortness of breath 4)  Hydrochlorothiazide 25 Mg Tabs (Hydrochlorothiazide) .... Take 1 tablet by mouth once a day 5)  Spiriva Handihaler 18 Mcg Caps (Tiotropium bromide monohydrate) .... Inhale contents of 1 capsule once a day 6)  Crestor 20 Mg Tabs (Rosuvastatin calcium) .... Take 1 tablet by mouth once a day 7)  Advair Diskus 250-50 Mcg/dose Misc (Fluticasone-salmeterol) .Marland Kitchen.. 1 puff twice daily 8)  Glucophage Xr 500 Mg Tb24 (Metformin hcl) .... 2 pills in am, 2 pills in pm 9)  Cymbalta 30 Mg Cpep (Duloxetine hcl) .... Take 3  tables by mouth once daily 10)  Aspir-low 81 Mg Tbec (Aspirin) 11)  Glipizide Xl 10 Mg Tb24 (Glipizide) .... Take 1 tablet  by mouth twice a day 12)  Hydrocodone-ibuprofen 7.5-200 Mg Tabs (Hydrocodone-ibuprofen) .... Take one tab by mouth once every 6 hours as needed for pain 13)  Loratadine 10 Mg Tabs (Loratadine) .... Take 1 tablet by mouth once a day 14)  Ferrous Sulfate 325 (65 Fe) Mg Tbec (Ferrous sulfate) .... Take 1 tablet by mouth three times a day 15)  Lantus 100 Unit/ml Soln (Insulin glargine) .... Inject 30 units subcutaneously daily and increase until fasting blood sugars are mostly less than 130 mg/dl 16)  Onetouch Ultra Test Strp (Glucose blood) .... Use to test blood sugars three times a day 17)  Insulin Syringe 31g X 5/16" 0.3 Ml Misc (Insulin syringe-needle u-100) .... Use to inject lantus insulin once daily 18)  Novolog 100 Unit/ml Soln (Insulin aspart) .... Inject 5 units with each meal.  Patient Instructions: 1)  Please schedule a follow-up appointment in 2 months. Prescriptions: ALPRAZOLAM 0.5 MG  TABS (ALPRAZOLAM) one pill daily as needed for anxiety  #100 x 0   Entered and Authorized by:   Felicity Pellegrini MD   Signed by:   Felicity Pellegrini MD on 06/15/2008   Method used:   Print then Give to Patient   RxID:   [id number] NOVOLOG 100 UNIT/ML SOLN (INSULIN ASPART) Inject 5 units with each meal.  #1 vial x 3   Entered and Authorized by:    Felicity Pellegrini MD   Signed by:   Felicity Pellegrini MD on 06/15/2008   Method used:   Print then Give to Patient   RxID:   [id number]    Appended Document: 9MONTH FU/EST/VS Billing code of est. Pt level IV for DM, facial flushing, and htn should be attached to this visit.

## 2010-03-12 NOTE — Progress Notes (Signed)
Summary: Refill/gh  Phone Note Refill Request Message from:  Fax from Pharmacy on October 12, 2009 5:13 PM  Refills Requested: Medication #1:  WELLBUTRIN XL 150 MG XR24H-TAB take 1 tablet once daily  Method Requested: Electronic Initial call taken by: Sander Nephew RN,  October 12, 2009 5:13 PM  Follow-up for Phone Call       Follow-up by: [name] MD,  October 12, 2009 6:16 PM    Prescriptions: WELLBUTRIN XL 150 MG XR24H-TAB (BUPROPION HCL) take 1 tablet once daily  #30 x 3   Entered and Authorized by:   [name] MD   Signed by:   [name] MD on 10/12/2009   Method used:   Faxed to ...       [facility] (mail-order)       [address] [place], [place]  [address]       Ph: [phone]       Fax: [phone]   RxID:   [id number]

## 2010-03-12 NOTE — Miscellaneous (Signed)
[facility]   Imported By: Enedina Finner 11/23/2009 15:08:50  _____________________________________________________________________  External Attachment:    Type:   Image     Comment:   External Document

## 2010-03-12 NOTE — Assessment & Plan Note (Signed)
Summary: CHECKUP/ SB.   Vital Signs:  Patient Profile:   60 Years Old Female Height:     64 inches (162.56 cm) Weight:      180.0 pounds (81.82 kg) BMI:     31.01 Temp:     97.1 degrees F rectal Pulse (ortho):   101 / minute BP standing:   103 / 67  Pt. in pain?   yes    Location:   left arm    Intensity:   7  Vitals Entered By: [name]) (March 24, 2007 2:34 PM)               Does patient need assistance? Functional Status Self care Ambulation Normal      Serial Vital Signs/Assessments:  Time      Position  BP       Pulse  Resp  Temp     By           Lying RA  133/71   107                   [name],CMA (AAMA)           Sitting   119/65   197 [name] (AAMA)           Standing  103/67   [name] ([name])   PCP:  [name]  Chief Complaint:  dizziness and left arm pain.  History of Present Illness: Ms.  Horn is a 60 yo woman with a PMH as listed who presents today complaining of pain "all over", "razor blades in her mouth" and burning in the skin around her lower abdomen.  She has continued to have pain in her left arm which she describes as a knot with radiation to her neck and wrist.  It is worse when she flexes her wrist but not when she turns her head.  She denies numbness or weakness in her left arm.  In regards to her mouth pain, she denies pain with swallowing, does report multiple cavities with tooth pain, and thinks it has gotten worse since starting Imipramine which has helped her fibromyalgia and depression some.         Current Allergies (reviewed today): ! ULTRAM ! MORPHINE    Risk Factors:  Tobacco use:  current    Year started:  AT THE AGE OF 12    Cigarettes:  Yes -- 1.5 pack(s) per day    Counseled to quit/cut down tobacco use:  yes Alcohol use:  no Exercise:  no Seatbelt use:  100 %   Review of Systems  General      Denies chills and fever.  CV      Denies chest pain or discomfort, fainting, palpitations, and swelling of feet.  GI      Denies abdominal pain, bloody stools, change in bowel habits, and dark tarry stools.   Physical Exam  General:     alert and overweight-appearing.  NAD Mouth:     No plaques or signs of thrush.  poor dentition and xerostomia.   Lungs:     normal respiratory effort and normal breath sounds.   Heart:     normal rate, regular rhythm, and no murmur.   Abdomen:  soft, non-tender, and normal bowel sounds.  Mild erythematous papules under lower pannus Msk:     Pain over palpation of left bicep.  No pain with palpation of shoulder.  Pain with active abduction but not passive.  Pain not reproduced by turning neck.   Neurologic:     Strength in upper extremities is 5/5, sensation in upper extremities is normal, DTR's are 2+ and symmetric.      Impression & Recommendations:  Problem # 1:  ARM PAIN, LEFT (ICD-729.5) I agree with [name], this is likely secondary to patient's chronic pain syndrome as she does not have neurologic findings on exam or reports of weakness.  If pain is still present in one month, will check cervical films and consider MRI.    Problem # 2:  FIBROMYALGIA (ICD-729.1) Increase imipramine as tolerated.  Continue Cymbalta and as needed Vicodin.   Her updated medication list for this problem includes:    Aspir-low 81 Mg Tbec (Aspirin)    Vicodin 5-500 Mg Tabs (Hydrocodone-acetaminophen) .Marland Kitchen... Take one tablet by mouth every eight hours as needed for pain   Problem # 3:  OSTEOPOROSIS (ICD-733.00) Pt unable to afford repeat dexa scan currently.  She refuses to take Fosamax, but "will let us know when she is ready to start".  She has calcium at home but does not take it.    The following medications were removed from the medication list:    Fosamax 70 Mg Tabs (Alendronate sodium) .Marland Kitchen... Take one tablet weekly   Problem # 4:  Mouth pain  No evidence of thrush.  Likely  secondary to TCA and dry mouth.  Recommended keeping water nearby and using hard candy as needed.    Problem # 5:  Picca Will check CBC for symptom of picca and "all over weakness".    Problem # 6:  AODM (ICD-250.00) Discontinued Januvia secondary to lack of outcome evidence.  Will increase glipizide if CBG's elevated.    The following medications were removed from the medication list:    Januvia 100 Mg Tabs (Sitagliptin phosphate) .Marland Kitchen... Take 1 tablet by mouth once a day  Her updated medication list for this problem includes:    Glucophage Xr 500 Mg Tb24 (Metformin hcl) .Marland Kitchen... 2 pills in am, 2 pills in pm    Aspir-low 81 Mg Tbec (Aspirin)    Glipizide 5 Mg Tabs (Glipizide) .Marland Kitchen... Take 1 tablet by mouth twice a day with meals  Orders: T-Basic Metabolic Panel ([id number]) T-Hgb A1C (in-house) (82423NT) T-Urine Microalbumin w/creat. ratio (61443 / [id number]) T-Lipid Profile ([id number])   Problem # 7:  Yeast skin infection Will use nyastatin cream as needed.    Complete Medication List: 1)  Prilosec 40 Mg Cpdr (Omeprazole) .... Take 1 capsule by mouth once a day 2)  Alprazolam 0.5 Mg Tabs (Alprazolam) .... One pill daily as needed for anxiety 3)  Albuterol 90 Mcg/act Aers (Albuterol) .... Two puffs every four hours as needed for shortness of breath 4)  Hydrochlorothiazide 25 Mg Tabs (Hydrochlorothiazide) .... Take 1 tablet by mouth once a day 5)  Spiriva Handihaler 18 Mcg Caps (Tiotropium bromide monohydrate) .... Inhale contents of 1 capsule once a day 6)  Crestor 20 Mg Tabs (Rosuvastatin calcium) .... Take 1 tablet by mouth once a day 7)  Advair Diskus 250-50 Mcg/dose Misc (Fluticasone-salmeterol) .Marland Kitchen.. 1 puff twice daily 8)  Glucophage Xr 500 Mg Tb24 (Metformin hcl) .... 2 pills in am, 2 pills in pm 9)  Cymbalta 30 Mg Cpep (  Duloxetine hcl) .... Take 3  tables by mouth once daily 10)  Aspir-low 81 Mg Tbec (Aspirin) 11)  Glipizide 5 Mg Tabs (Glipizide) .... Take 1 tablet by mouth  twice a day with meals 12)  Vicodin 5-500 Mg Tabs (Hydrocodone-acetaminophen) .... Take one tablet by mouth every eight hours as needed for pain 13)  Imipramine Hcl 50 Mg Tabs (Imipramine hcl) .... Take 1 tablet by mouth at night as needed for insomnia 14)  Loratadine 10 Mg Tabs (Loratadine) .... Take 1 tablet by mouth once a day 15)  Nystatin 100000 Unit/gm Crea (Nystatin) .... Apply to affected area two times a day.  Other Orders: T-CBC No Diff ([id number])   Patient Instructions: 1)  Please schedule a follow-up appointment in 1 month for Pap smear. 2)  Stop taking Januvia.  Check your sugars once a day at different times.  If they are consistently above 150, start taking one tablet of glipizide twice a day.  Symptoms of hypoglycemia include dizziness, breaking out in a sweat, and palpitations.  If you experience any of this, please check your blood sugar.      Prescriptions: NYSTATIN 100000 UNIT/GM  CREA (NYSTATIN) Apply to affected area two times a day.  #1 tube x 2   Entered and Authorized by:   [name]   Signed by:   [name] on 03/24/2007   Method used:   Electronically sent to ...       [facility] [place], [place]  [address]       Ph: [phone]       Fax: [phone]   RxID:   [id number] GLIPIZIDE 5 MG TABS (GLIPIZIDE) Take 1 tablet by mouth twice a day with meals  #62 x 5   Entered and Authorized by:   [name]   Signed by:   [name] on 03/24/2007   Method used:   Electronically sent to ...       [facility] [place], [place]  [address]       Ph: [phone]       Fax: [phone]   RxID:   [id number] IMIPRAMINE HCL 50 MG  TABS (IMIPRAMINE HCL) Take 1 tablet by mouth at night as needed for insomnia  #31 x 5   Entered and Authorized by:   [name]   Signed by:   [name] on 03/24/2007   Method used:   Electronically sent to ...        [place], [place]  [address]       Ph: [phone]       Fax: [phone]   RxID:   [id number] CRESTOR 20 MG  TABS (ROSUVASTATIN CALCIUM) Take 1 tablet by mouth once a day  #31 x 5   Entered and Authorized by:   [name]   Signed by:   [name] on 03/24/2007   Method used:   Electronically sent to ...       [place], [place]  [address]       Ph: [phone]       Fax: [phone]  RxID:   [email or web address]  ]  Appended Document: A1C RESULT    Lab Visit   Laboratory Results   Blood Tests   Date/Time Received: March 24, 2007 4:40 PM  Date/Time Reported: ..................................................................Marland KitchenMeta Moore  March 24, 2007 4:40 PM    HGBA1C: 5.9%   (Normal Range: Non-Diabetic - 3-6%   Control Diabetic - 6-8%)     Orders Today:  Appended Document: CHECKUP/ SB. Please add billing code est pt level IV.

## 2010-03-12 NOTE — Progress Notes (Signed)
Summary: diabetes support/? hypoglycemia?/dmr  Phone Note Call from Patient Call back at Home Phone [phone]   Caller: Patient Details for Reason: talk to cde Summary of Call: has an appointment tomorow- scheduled for 3:30 pm with cde. she also wants to talk with Bonnita Nasuti. legs giving out, not dizzy, thinks she may have problems with her legs and ? back- knows she is dealing with depression also, has 3 names of counselors- not sure how she'll afford copays.  78 in am, ate a little something, sugar was over 300, waited- came down to 190s, ate oatmeal. needs a form for an eye doctor- will give to her tomorrow- also discusses likelilhood that she may be having hypoglycemia +/- rebound hypoerglycemia with very well controlled A1Cs. will follow-up with meter and A1C tomorrow.    will route to [facility] and request she call [facility] per patient request Initial call taken by: Barnabas Harries RD,CDE,  Jun 19, 2009 9:37 AM  Follow-up for Phone Call        i spoke w/ pt, she stated she just needed to hear a nice voice and know that someone cared, she seems more depressed Follow-up by: Freddy Finner RN,  Jun 19, 2009 1:38 PM  Additional Follow-up for Phone Call Additional follow up Details #1::        will address at the appontment today thanks iskra    New/Updated Medications: LANTUS 100 UNIT/ML SOLN (INSULIN GLARGINE) Inject 35 units subcutaneously daily and increase until fasting blood sugars are mostly less than 130 mg/dl

## 2010-03-12 NOTE — Progress Notes (Signed)
Summary: refill/gg  Phone Note Refill Request  on March 09, 2007 3:07 PM  Refills Requested: Medication #1:  JANUVIA 100 MG TABS Take 1 tablet by mouth once a day   Last Refilled: 02/01/2007  Method Requested: [employer] Initial call taken by: Gevena Cotton RN,  March 09, 2007 3:08 PM      Prescriptions: JANUVIA 100 MG TABS (SITAGLIPTIN PHOSPHATE) Take 1 tablet by mouth once a day  #31 x 2   Entered and Authorized by:   Izora Gala Phifer MD   Signed by:   Izora Gala Phifer MD on 03/10/2007   Method used:   Electronically sent to ...       [place], [place]  [address]       Ph: [phone]       Fax: [phone]   RxID:   [id number]

## 2010-03-12 NOTE — Miscellaneous (Signed)
Summary: hosp admission  INTERNAL MEDICINE ADMISSION HISTORY AND PHYSICAL PCP: Dr. Donnella Bi  Attending: Dr. Stann Mainland 1st contact: Dr. Vinnie Langton [phone] 2nd contact: Dr. Ronny Flurry [phone]  After 5pm and weekends: 1st: [phone] 2nd: [phone]  CC: body aches, cough, fever  HPI: [name] is a 60 yo F with PMH of COPD, fibromyalgia, anemia, DM, and pulmonary hypertension thought 2/2 OSA who presents with fever, body aches, and cough. The patient said she has had a cough for the past 1-2 weeks, alternates non-productive with productive of mostly white (but occasionally yellow) sputum, and had no other symptoms at that time. She was seen for a regularly scheduled appt on Thursday 2/10 by Dr. Donnella Bi, who diagnosed her with a URI and gave her a rx for Mucinex. The patient said her condition was unchanged until today when she awoke with terrible body aches, almost prohibiting her from getting out of bed. Her cough also worsened today and she has not eaten or drank anything today 2/2 poor appetite. She felt so "terrible" that she came to clinic and was found to have a fever of 102. She denies runny nose, nausea, vomiting, or diarrhea. She is being admitted for supportive care.  ALLERGIES: Allergies: ! ULTRAM ! MORPHINE  PAST MEDICAL HISTORY: COPD Depression Diabetes mellitus, type I- last HgbA1c = 6.1 (03/22/09) Hypertension Pulmonary Hypertenion- PA = 55 mmHg per echo 4/09, thought 2/2 OSA OSA Osteoporosis Anxiety Low back pain Chronic diffuse pain involving the back, arms, and legs- diagnosed as fibromylagia  MEDICATIONS: Current Meds:  PRILOSEC OTC 20 MG  TBEC (OMEPRAZOLE MAGNESIUM) Take two tablets by mouth daily ALPRAZOLAM 0.5 MG  TABS (ALPRAZOLAM) one pill daily as needed for anxiety ALBUTEROL 90 MCG/ACT AERS (ALBUTEROL) Two puffs every four hours as needed for shortness of breath HYDROCHLOROTHIAZIDE 25 MG TABS (HYDROCHLOROTHIAZIDE) Take 1 tablet by mouth once a day SPIRIVA HANDIHALER 18  MCG CAPS (TIOTROPIUM BROMIDE MONOHYDRATE) Inhale contents of 1 capsule once a day CRESTOR 20 MG  TABS (ROSUVASTATIN CALCIUM) Take 1 tablet by mouth once a day ADVAIR DISKUS 250-50 MCG/DOSE MISC (FLUTICASONE-SALMETEROL) 1 puff twice daily GLUCOPHAGE XR 500 MG XR24H-TAB (METFORMIN HCL) 2 pills twice a day CYMBALTA 30 MG CPEP (DULOXETINE HCL) Take 3  tables by mouth once daily ASPIR-LOW 81 MG TBEC (ASPIRIN) Take 1 tablet by mouth once a day GLIPIZIDE XL 10 MG  TB24 (GLIPIZIDE) Take 1 tablet by mouth twice a day HYDROCODONE-IBUPROFEN 7.5-200 MG TABS (HYDROCODONE-IBUPROFEN) Take one tab by mouth once every 6 hours as needed for pain LANTUS 100 UNIT/ML SOLN (INSULIN GLARGINE) Inject 30 units subcutaneously daily and increase until fasting blood sugars are mostly less than 130 mg/dl ONETOUCH ULTRA TEST  STRP (GLUCOSE BLOOD) Use to test blood sugars three times a day INSULIN SYRINGE 31G X 5/16" 0.3 ML MISC (INSULIN SYRINGE-NEEDLE U-100) use to inject lantus insulin once daily LISINOPRIL 5 MG TABS (LISINOPRIL) Take 1 tablet by mouth once a day MUCINEX 600 MG XR12H-TAB (GUAIFENESIN) take 1 tablet twice daily for 10-14 days   SOCIAL HISTORY: Social History: Lives alone.  No EtOH.  No illegal drugs. Smokes 1.5 PPD x 46 years. On disability for fibromylagia. Poor relationship with children.    FAMILY HISTORY No significant family medical history  ROS: as per HPI  VITALS: T: 102.1 P: 106  BP:101/62  R: not documented  O2SAT: not documented  ON:  PHYSICAL EXAM: General:  ill appearing Mouth:  pharyngeal erythema. no exudates Neck:  supple.   Lungs:  normal respiratory  effort, no intercostal retractions, no accessory muscle use, normal breath sounds, no crackles, and no wheezes. Slightly decreased breath sounds R base   Heart:  tachycardic, reg rhythm, no murmur, no gallop, and no rub.   Abdomen:  soft, non-tender, normal bowel sounds, and no distention.   Msk:  normal ROM.   Extremities:  No  clubbing, cyanosis, edema, or deformity noted with normal full range of motion of all joints.   Neurologic:  alert & oriented X3, strength normal in all extremities, and gait normal.   LABS: pending   ASSESSMENT AND PLAN: This is a 60 yo F with PMH significant for COPD and pulmonary hypertension who presents with fever, body aches, and cough.   1. Respiratory Infection- differential includes influenza vs PNA. Pt does not report any runny nose, making influenza somewhat less likely, though she does have a sore throat and other symptoms c/w influenza. Will start on Tamiflu and Avelox. Will check a CXR and put patient on droplet precautions. Given her poor by mouth intake, pt was given NS 1L bolus in clinic. Pt expressed desire to eat, so will give CLD and gently hydrate with NS.  2. COPD- lungs sound clear. Continue home advair, albuterol, and spiriva.  3. HTN- BP soft, will hold BP meds at this time  4. OSA- cont CPAP at bedtime  5. DM- will decrease lantus to 15 units daily, cont glipizide, and SSI. Will hold metformin.  6. PPx- Lovenox

## 2010-03-12 NOTE — Consult Note (Signed)
Summary: [facility]  [facility]   Imported By: Bonner Puna 09/21/2007 15:58:20  _____________________________________________________________________  External Attachment:    Type:   Image     Comment:   External Document  Appended Document: [facility]    Clinical Lists Changes  Observations: Added new observation of [id number]: 09/2008 (12/02/2007 16:08) Added new observation of DIAB EYE EX: No diabetic retinopathy.    (09/09/2007 16:13)       Diabetic Eye Exam  Procedure date:  09/09/2007  Findings:      No diabetic retinopathy.     Procedures Next Due Date:    Diabetic Eye Exam: 09/2008   Diabetic Eye Exam  Procedure date:  09/09/2007  Findings:      No diabetic retinopathy.     Procedures Next Due Date:    Diabetic Eye Exam: 09/2008

## 2010-03-12 NOTE — Progress Notes (Signed)
Summary: page 3  Phone Note Refill Request Message from:  Pharmacy on February 26, 2009 4:10 PM  Refills Requested: Medication #1:  CYMBALTA 30 MG CPEP Take 3  tables by mouth once daily   Dosage confirmed as above?Dosage Confirmed   Supply Requested: 1 year  Medication #2:  GLIPIZIDE XL 10 MG  TB24 Take 1 tablet by mouth twice a day   Dosage confirmed as above?Dosage Confirmed   Supply Requested: 1 year  Medication #3:  HYDROCODONE-IBUPROFEN 7.5-200 MG TABS Take one tab by mouth once every 6 hours as needed for pain   Dosage confirmed as above?Dosage Confirmed   Supply Requested: 1 month  Medication #4:  LANTUS 100 UNIT/ML SOLN Inject 30 units subcutaneously daily and increase until fasting blood sugars are mostly less than 130 mg/dl   Dosage confirmed as above?Dosage Confirmed   Supply Requested: 1 year Initial call taken by: Freddy Finner RN,  February 26, 2009 4:20 PM    Prescriptions: LANTUS 100 UNIT/ML SOLN (INSULIN GLARGINE) Inject 30 units subcutaneously daily and increase until fasting blood sugars are mostly less than 130 mg/dl  #10 millilite x 11   Entered and Authorized by:   [name] MD   Signed by:   [name] MD on 02/26/2009   Method used:   Faxed to ...       [facility] (mail-order)       [address] [place], [place]  [address]       Ph: [phone]       Fax: [phone]   RxID:   [id number] GLIPIZIDE XL 10 MG  TB24 (GLIPIZIDE) Take 1 tablet by mouth twice a day  #60 x 11   Entered and Authorized by:   [name] MD   Signed by:   [name] MD on 02/26/2009   Method used:   Faxed to ...       [facility] (mail-order)       [address] [place], [place]  [address]       Ph: [phone]       Fax: [phone]   RxID:   [id number] CYMBALTA 30 MG CPEP (DULOXETINE HCL) Take 3  tables by mouth once daily  #90 x 11   Entered and Authorized by:   [name] MD   Signed by:    [name] MD on 02/26/2009   Method used:   Faxed to ...       [facility] (mail-order)       [address] [place], [place]  [address]       Ph: [phone]       Fax: [phone]   RxID:   [id number]

## 2010-03-12 NOTE — Miscellaneous (Signed)
Summary: Initial Summary For PT  Initial Summary For PT   Imported By: Bonner Puna 08/27/2007 14:26:50  _____________________________________________________________________  External Attachment:    Type:   Image     Comment:   External Document

## 2010-03-12 NOTE — Assessment & Plan Note (Signed)
Summary: F/U DM, Constipation, Abdominal Pain   Vital Signs:  Patient profile:   60 year old female Height:      64 inches (162.56 cm) Weight:      173.5 pounds (78.86 kg) BMI:     29.89 Temp:     97.8 degrees F (36.56 degrees C) oral Pulse rate:   85 / minute BP sitting:   146 / 66  (left arm) Cuff size:   regular  Vitals Entered By: Lucky Rathke NT II (November 23, 2009 11:31 AM) CC: ELEVATED  CBG  Is Patient Diabetic? Yes Did you bring your meter with you today? Yes  Have you ever been in a relationship where you felt threatened, hurt or afraid?No   Does patient need assistance? Functional Status Self care Ambulation Normal    Primary Care Provider:  Trinidad Curet MD  CC:  ELEVATED  CBG .  History of Present Illness: Pt is a 60 yo F with PMHx of DMII, fibromyalgia, COPD, who presents to clinic today with multiple medical concerns, including the following:  1) DMII - During last OV 11/06/09, pt's Lantus was decreased to 30 untis with concern that her HgA1c of 5.6 in 09/2009 despite reports BG between 150-200s may have reflected overnight hypoglycemic events. Pt was recommended to check BG more frequently, and return with glucometer. Today, pt states well-tolerating Lantus 30 units daily - with last dose last night. Reports blood sugars ranging between 130-338 mg/dL, and are steadily rising as the day progresses even if she doesn't eat.  Last HgA1c in 09/11 was 7.3 from 5.6 in 08/11. No hypoglycemic episodes since last visit.  + Polyuria, polydipsia, no blurry vision, no nausea, no vomiting.  2) Abdominal - tight, nonradiating, intermittent epigastric pain occurs once daily, usually after meals, occurs at rest.  Worse with caffeine. No palpitations, no tachycardia. Taking Prilosec regularly. Will f/u with GI next week (while in hospital in 08/2009 GI was consulted and had recommended outpatient EGD soon, but patient has yet to f/u).   3) Constipation - daily, BM occuring  approximately every 3 days with hard stools, last BM 3 days ago- at times equiring manual manipulation. Of note, pt chronically on narcotics for Fibromyalgia pain. Currently taking Colace BID. Past 2 weeks has had blood on TP. No melena or hematochezia. Last colonoscopy in 2005 had shown two colonic polyps, one adenomatous, one hyperplastic. While inpt in 08/2009, GI recommended regular f/u colonoscopy in 2013, without earlier test needed. Pt denies weakness, lightheadedness, dizziness as previously encountered with her anemia. Last CBC 09/2009 showed Hgb of 13.6.   Depression History:      The patient denies a depressed mood most of the day and a diminished interest in her usual daily activities.         Preventive Screening-Counseling & Management  Alcohol-Tobacco     Alcohol drinks/day: 0     Smoking Status: current     Smoking Cessation Counseling: yes     Packs/Day: 1.5     Year Started: AT THE AGE OF 12  Caffeine-Diet-Exercise     Does Patient Exercise: yes     Type of exercise: WALKING  Current Medications (verified): 1)  Prilosec 40 Mg Cpdr (Omeprazole) .... Take 1 Tablet By Mouth Once A Day 2)  Alprazolam 0.5 Mg  Tabs (Alprazolam) .... One Pill Daily As Needed For Anxiety 3)  Albuterol 90 Mcg/act Aers (Albuterol) .... Two Puffs Every Four Hours As Needed For Shortness of Breath 4)  Spiriva Handihaler 18 Mcg Caps (Tiotropium Bromide Monohydrate) .... Inhale Contents of 1 Capsule Once A Day 5)  Crestor 20 Mg  Tabs (Rosuvastatin Calcium) .... Take 1 Tablet By Mouth Once A Day 6)  Advair Diskus 250-50 Mcg/dose Misc (Fluticasone-Salmeterol) .Marland Kitchen.. 1 Puff Twice Daily 7)  Cymbalta 30 Mg Cpep (Duloxetine Hcl) .... Take 3  Tables By Mouth Once Daily 8)  Aspir-Low 81 Mg Tbec (Aspirin) .... Take 1 Tablet By Mouth Once A Day 9)  Lantus 100 Unit/ml Soln (Insulin Glargine) .... Inject 30 Units Subcutaneously Daily 10)  Onetouch Ultra Test  Strp (Glucose Blood) .... Use To Test Blood Sugars  Three Times A Day 11)  Insulin Syringe 31g X 5/16" 0.3 Ml Misc (Insulin Syringe-Needle U-100) .... Use To Inject Lantus Insulin Once Daily 12)  Loratadine 10 Mg Tabs (Loratadine) .... Take 1 Tablet By Mouth Once A Day 13)  Wellbutrin Xl 150 Mg Xr24h-Tab (Bupropion Hcl) .... Take 1 Tablet Once Daily 14)  Ferrous Sulfate 325 (65 Fe) Mg Tabs (Ferrous Sulfate) .... Take 1 Tablet By Mouth Two Times A Day For Low Blood Iron 15)  Colace 100 Mg Caps (Docusate Sodium) .... Take 1 Tablet By Mouth Two Times A Day 16)  Prinzide 10-12.5 Mg Tabs (Lisinopril-Hydrochlorothiazide) .... Take 1 Tablet By Mouth Once A Day 17)  Th Flax Seed Oil 1000 Mg Caps (Flaxseed (Linseed)) .... 2 Tablespoons Daily 18)  Norco 10-325 Mg Tabs (Hydrocodone-Acetaminophen) .... Take 1 Tablet By Mouth Four Times A Day As Needed Pain  Allergies (verified): 1)  ! Ultram 2)  ! Morphine  Physical Exam  General:  Vital signs reviewed and noted. Well-developed,well-nourished,in no acute distress; alert,appropriate and cooperative throughout examination. Head: normocephalic, atraumatic. Lungs: Normal respiratory effort. Clear to auscultation BL without crackles or wheezes.  Heart: RRR. S1 and S2 normal without gallop, murmur, or rubs.  Abdomen: BS normoactive. Soft, Nondistended, TTP over epigastrium.  No masses or organomegaly. Extremities: No pretibial edema.     Impression & Recommendations:  Problem # 1:  AODM (ICD-250.00) Assessment Deteriorated Glucometer log reviewed. Blood sugars not well-controlled, with elevated blood sugars throughout day, trending up as day progresses. However, pt not eating regularly, and is skipping meals. Therefore, true post-prandial levels not accurately assessed. - Will increase Lantus to 34 units - Will counsel pt to eat more regularly, and check BG 2-3 times daily AM fasting and post-prandial 1-2 times daily - Will likely require short-acting agent at least for largest meal. However, given  prior hx of hypoglycemia, will plan to slowly adjust meds.  Her updated medication list for this problem includes:    Aspir-low 81 Mg Tbec (Aspirin) .Marland Kitchen... Take 1 tablet by mouth once a day    Lantus 100 Unit/ml Soln (Insulin glargine) ..... Inject 34 units subcutaneously daily    Prinzide 10-12.5 Mg Tabs (Lisinopril-hydrochlorothiazide) .Marland Kitchen... Take 1 tablet by mouth once a day  Problem # 2:  GERD (ICD-530.81) Assessment: Deteriorated Pain persistent despite Prilosec 50m daily. Pt scheduled to see GI next week - who had previously recommended EGD soon - which will likely  be helpful for further evaluation.  - Continue current managment with close f/u with GI.  Her updated medication list for this problem includes:    Prilosec 40 Mg Cpdr (Omeprazole) ..Marland Kitchen.. Take 1 tablet by mouth once a day  Problem # 3:  CONSTIPATION, CHRONIC (ICD-564.09) Assessment: Deteriorated Likely secondary to chronic narcotic usage. Blood likely 2/2 to manual manipulation, and straining is currently very small  amount. Already on Colace two times a day, has not tried Metamucil in the past. - Will continue Colace, and add Metamucil (pt prefers capsules to powder) - Will monitor bleeding - Pt to f/u with GI next week, and will readdress with the specialist.  Her updated medication list for this problem includes:    Colace 100 Mg Caps (Docusate sodium) .Marland Kitchen... Take 1 tablet by mouth two times a day    Metamucil 0.52 Gm Caps (Psyllium) .Marland Kitchen... Take 1 tablet by mouth once a day  Complete Medication List: 1)  Prilosec 40 Mg Cpdr (Omeprazole) .... Take 1 tablet by mouth once a day 2)  Alprazolam 0.5 Mg Tabs (Alprazolam) .... One pill daily as needed for anxiety 3)  Albuterol 90 Mcg/act Aers (Albuterol) .... Two puffs every four hours as needed for shortness of breath 4)  Spiriva Handihaler 18 Mcg Caps (Tiotropium bromide monohydrate) .... Inhale contents of 1 capsule once a day 5)  Crestor 20 Mg Tabs (Rosuvastatin calcium)  .... Take 1 tablet by mouth once a day 6)  Advair Diskus 250-50 Mcg/dose Misc (Fluticasone-salmeterol) .Marland Kitchen.. 1 puff twice daily 7)  Cymbalta 30 Mg Cpep (Duloxetine hcl) .... Take 3  tables by mouth once daily 8)  Aspir-low 81 Mg Tbec (Aspirin) .... Take 1 tablet by mouth once a day 9)  Lantus 100 Unit/ml Soln (Insulin glargine) .... Inject 34 units subcutaneously daily 10)  Onetouch Ultra Test Strp (Glucose blood) .... Use to test blood sugars three times a day 11)  Insulin Syringe 31g X 5/16" 0.3 Ml Misc (Insulin syringe-needle u-100) .... Use to inject lantus insulin once daily 12)  Loratadine 10 Mg Tabs (Loratadine) .... Take 1 tablet by mouth once a day 13)  Wellbutrin Xl 150 Mg Xr24h-tab (Bupropion hcl) .... Take 1 tablet once daily 14)  Ferrous Sulfate 325 (65 Fe) Mg Tabs (Ferrous sulfate) .... Take 1 tablet by mouth two times a day for low blood iron 15)  Colace 100 Mg Caps (Docusate sodium) .... Take 1 tablet by mouth two times a day 16)  Prinzide 10-12.5 Mg Tabs (Lisinopril-hydrochlorothiazide) .... Take 1 tablet by mouth once a day 17)  Th Flax Seed Oil 1000 Mg Caps (Flaxseed (linseed)) .... 2 tablespoons daily 18)  Norco 10-325 Mg Tabs (Hydrocodone-acetaminophen) .... Take 1 tablet by mouth four times a day as needed pain 19)  Metamucil 0.52 Gm Caps (Psyllium) .... Take 1 tablet by mouth once a day  Patient Instructions: 1)  Please follow-up with me in 1 month, at which time we will reevaluate your diabetes, blood pressure, abdominal pain. 2)  You have been started on Metamucil, if you develop rash, difficulty breathing, shortness of breath, please stop the medication and call the clinic at [phone]. 3)  Please check your sugars 2-3 times daily, on awakening, and 2 hours after 1-2 meals of the day. 4)  Please increase your Lantus to 34 units daily. 5)  Please follow-up with Gastroenterology as scheduled next week. 6)  If you have worsening of your symptoms, develop chest pain,  difficulty breathing, please call the clinic. 7)  Please bring all of your medications in a bag to your next visit. Prescriptions: METAMUCIL 0.52 GM CAPS (PSYLLIUM) Take 1 tablet by mouth once a day  #30 x 2   Entered and Authorized by:   [name] DO   Signed by:   [name] DO on 11/23/2009   Method used:   Print then Give to Patient   RxID:  [id number]    Prevention & Chronic Care Immunizations   Influenza vaccine: Historical  (10/23/2008)   Influenza vaccine deferral: Not indicated  (06/20/2009)   Influenza vaccine due: 11/27/2006    Tetanus booster: 10/19/2006: Td   Td booster deferral: Not indicated  (11/08/2009)   Tetanus booster due: 10/18/2016    Pneumococcal vaccine: Not documented    H. zoster vaccine: Not documented   H. zoster vaccine deferral: Not indicated  (08/28/2009)  Colorectal Screening   Hemoccult: Not documented   Hemoccult action/deferral: Not indicated  (08/28/2009)    Colonoscopy: normal  (08/18/2005)   Colonoscopy action/deferral: Not indicated  (03/22/2009)   Colonoscopy due: 08/19/2015  Other Screening   Pap smear: NEGATIVE FOR INTRAEPITHELIAL LESIONS OR MALIGNANCY.  (12/01/2008)   Pap smear action/deferral: GYN Referral  (11/15/2008)   Pap smear due: 12/2011    Mammogram: Assessment: BIRADS 1.   (08/03/2007)   Mammogram action/deferral: Not indicated  (03/22/2009)   Mammogram due: 08/02/2008    DXA bone density scan: Not documented   Smoking status: current  (11/23/2009)   Smoking cessation counseling: yes  (11/23/2009)  Diabetes Mellitus   HgbA1C: 7.3  (11/08/2009)   HgbA1C action/deferral: Ordered  (06/20/2009)   Hemoglobin A1C due: 08/26/2006    Eye exam: No diabetic retinopathy.     (09/09/2007)   Diabetic eye exam action/deferral: Ophthalmology referral  (11/08/2009)   Eye exam due: 09/2008    Foot exam: yes  (09/14/2009)   Foot exam action/deferral: Not indicated   High risk foot: Not  documented   Foot care education: Not documented    Urine microalbumin/creatinine ratio: 13.9  (11/09/2009)   Urine microalbumin action/deferral: Ordered   Urine microalbumin/cr due: 08/26/2006    Diabetes flowsheet reviewed?: Yes   Progress toward A1C goal: Deteriorated  Lipids   Total Cholesterol: 111  (03/22/2009)   Lipid panel action/deferral: Lipid Panel ordered   LDL: 45  (03/22/2009)   LDL Direct: Not documented   HDL: 34  (03/22/2009)   Triglycerides: 159  (03/22/2009)   Lipid panel due: 11/27/2006    SGOT (AST): 14  (08/28/2009)   SGPT (ALT): 12  (08/28/2009)   Alkaline phosphatase: 38  (08/28/2009)   Total bilirubin: 0.4  (08/28/2009)  Hypertension   Last Blood Pressure: 146 / 66  (11/23/2009)   Serum creatinine: 0.82  (10/02/2009)   Serum potassium 4.5  (10/02/2009)  Self-Management Support :   Personal Goals (by the next clinic visit) :     Personal A1C goal: 7  (03/22/2009)     Personal blood pressure goal: 130/80  (03/22/2009)     Personal LDL goal: 100  (03/22/2009)    Patient will work on the following items until the next clinic visit to reach self-care goals:     Medications and monitoring: take my medicines every day, check my blood sugar, bring all of my medications to every visit, examine my feet every day  (11/23/2009)     Eating: drink diet soda or water instead of juice or soda, eat more vegetables, use fresh or frozen vegetables, eat foods that are low in salt, eat baked foods instead of fried foods, eat fruit for snacks and desserts, limit or avoid alcohol  (11/23/2009)     Activity: take a 30 minute walk every day  (11/23/2009)    Diabetes self-management support: Resources for patients handout  (11/23/2009)   Last diabetes self-management training by diabetes educator: 06/20/2009    Hypertension self-management support: Resources for patients handout  (  11/23/2009)    Lipid self-management support: Resources for patients handout  (11/23/2009)         Resource handout printed.   Nursing Instructions: Diabetic foot exam today    Appended Document: F/U DM, Constipation, Abdominal Pain Dr Guy Sandifer discussed Ms Menees with me and I have reviewed her documentation and agree with her findings and A/P.

## 2010-03-12 NOTE — Assessment & Plan Note (Signed)
Summary: FLU/SB.  Nurse Visit    Prior Medications: PRILOSEC OTC 20 MG  TBEC (OMEPRAZOLE MAGNESIUM) Take two tablets by mouth daily ALPRAZOLAM 0.5 MG  TABS (ALPRAZOLAM) one pill daily as needed for anxiety ALBUTEROL 90 MCG/ACT AERS (ALBUTEROL) Two puffs every four hours as needed for shortness of breath HYDROCHLOROTHIAZIDE 25 MG TABS (HYDROCHLOROTHIAZIDE) Take 1 tablet by mouth once a day SPIRIVA HANDIHALER 18 MCG CAPS (TIOTROPIUM BROMIDE MONOHYDRATE) Inhale contents of 1 capsule once a day CRESTOR 20 MG  TABS (ROSUVASTATIN CALCIUM) Take 1 tablet by mouth once a day ADVAIR DISKUS 250-50 MCG/DOSE MISC (FLUTICASONE-SALMETEROL) 1 puff twice daily GLUCOPHAGE XR 500 MG TB24 (METFORMIN HCL) 2 pills in AM, 2 pills in PM CYMBALTA 30 MG CPEP (DULOXETINE HCL) Take 3  tables by mouth once daily ASPIR-LOW 81 MG TBEC (ASPIRIN)  GLIPIZIDE XL 10 MG  TB24 (GLIPIZIDE) Take 2 tablet by mouth once a day VICODIN 5-500 MG  TABS (HYDROCODONE-ACETAMINOPHEN) Take one tablet by mouth every eight hours as needed for pain LORATADINE 10 MG  TABS (LORATADINE) Take 1 tablet by mouth once a day NYSTATIN 100000 UNIT/GM  CREA (NYSTATIN) Apply to affected area two times a day. FERROUS SULFATE 325 (65 FE) MG  TBEC (FERROUS SULFATE) Take 1 tablet by mouth three times a day TOPAMAX 25 MG  TABS (TOPIRAMATE) Take 1 tablet by mouth twice a day PREDNISONE (PAK) 10 MG TABS (PREDNISONE) Take tapering dose of prednisone over 6 days starting with 61m and finishing with 131mZITHROMAX 1 GM PACK (AZITHROMYCIN) Take as directed on package instructions Current Allergies: ! ULTRAM ! MORPHINE   Influenza Vaccine    Vaccine Type: Fluvax MCR    Site: left deltoid    Mfr: [employer]Smith[employer]    Dose: 0.5 ml    Route: IM    Given by: hla    Exp. Date: 08/09/2008    Lot #: [id number]  VIS given: 09/03/06 version given December 27, 2007.  Flu Vaccine Consent Questions    Do you have a history of severe allergic reactions to this  vaccine? no    Any prior history of allergic reactions to egg and/or gelatin? no    Do you have a sensitivity to the preservative Thimersol? no    Do you have a past history of Guillan-Barre Syndrome? no    Do you currently have an acute febrile illness? no    Have you ever had a severe reaction to latex? no    Vaccine information given and explained to patient? yes    Are you currently pregnant? no   Orders Added: 1)  Influenza Vaccine MCR [00025]    ]

## 2010-03-12 NOTE — Miscellaneous (Signed)
Summary: Social Work Follow-up   Social Work Evaluation Date  06/25/2006 Patient name [name]  Primary MD   : [name] MD Social Worker's name : Katy Fitch MSW- [place] [phone]    Cell phone: .  Marland Kitchen     Alternate phone: . Marland Kitchen       Individual making referral: Illene Regulus  Primary Reason for Referral:     Other Comments Needs MD signature on [facility] tax form  to verify permanent disability.   Dr. Derrel Nip is unclear about pt. disability because pt appears independent.   Illene Regulus working with patient to help her sort through this.  Social Work alerted to situation.  Action taken by Social Work: I called Dr. Derrel Nip and advised her that I would confirm the patient's disability by requesting Social Security Disability statement which would verify date of disability. Dr. Derrel Nip will sign off with proper verification.   I contacted the patient at [phone] and she said that she would go through [employer] who were involved in the original disability decision and they will likely sign off.  They are requesting her records from [place].   I told her that if that did not work out to please call me and I will have Dr. Derrel Nip sign off. [name] has a copy of the [employer] Disability verification which we can retrieve.   I will have the Social Security Disability information scanned to the patient's chart for future reference and patient knows she can call me if she runs into any barriers with this.

## 2010-03-12 NOTE — Assessment & Plan Note (Signed)
Summary: ACUTE-HFU-PER DR [facility]B([facility]ICK)   Vital Signs:  Patient profile:   60 year old female Height:      64 inches (162.56 cm) Weight:      169.9 pounds (77.23 kg) BMI:     29.27 Temp:     96.7 degrees F (35.94 degrees C) oral Pulse rate:   91 / minute BP sitting:   132 / 71  (right arm) Cuff size:   regular  Vitals Entered By: Lucky Rathke NT II (September 14, 2009 9:52 AM) CC: PATIENT IS [place] FOLLOW UP APPT / PAIN IN FEET 3# - HAD BEEN HAVING SWELLING IN FEET AND ANKLES Is Patient Diabetic? Yes Did you bring your meter with you today? No Pain Assessment Patient in pain? yes     Location: FEET Intensity:      3 Type: aching Onset of pain  FOR ABOUT 4 DAYS A GO Nutritional Status BMI of 25 - 29 = overweight CBG Result 139  Have you ever been in a relationship where you felt threatened, hurt or afraid?No   Does patient need assistance? Functional Status Self care Ambulation Normal   Primary Care Provider:  Trinidad Curet MD  CC:  PATIENT IS [place] FOLLOW UP APPT / PAIN IN FEET 3# - HAD BEEN HAVING SWELLING IN FEET AND ANKLES.  History of Present Illness: 60 yo f Patient presents for hospital follow-up of anemia.  denies any complaints today of fatigue which she had on admission, she has a GI f/u next week. Denies any GI bleed.   Today she also c/o of LE edema, for the past 4 days since her hctz was changed to lisinopril. she started taking hctz 2 days ago again.   Patient is feeling well and denies CP, abdominal pain, nausea, vomiting, HA's, palpitations, blurred vision. fever, chills, diarrhea, constipation or SOB.   Preventive Screening-Counseling & Management  Alcohol-Tobacco     Alcohol drinks/day: 0     Smoking Status: current     Smoking Cessation Counseling: yes     Packs/Day: 1.5     Year Started: AT THE AGE OF 12  Caffeine-Diet-Exercise     Does Patient Exercise: yes     Type of exercise: WALKING  Allergies: 1)   ! Ultram 2)  ! Morphine  Social History: Does Patient Exercise:  yes  Review of Systems       Per HPI  Physical Exam  General:  Well-developed,well-nourished,in no acute distress; alert,appropriate and cooperative throughout examination Head:  atraumatic, no abnormalities observed, and no abnormalities palpated.   Mouth:  no dental plaque, pharynx pink and moist, no postnasal drip, no pharyngeal crowing, and poor dentition.   Lungs:  normal respiratory effort, normal breath sounds, no crackles, and no wheezes.   Heart:  normal rate, regular rhythm, no murmur, no gallop, and no rub.   Abdomen:  soft, non-tender, and normal bowel sounds.   Msk:  No deformity or scoliosis noted of thoracic or lumbar spine.   Pulses:  R radial normal.   Extremities:  No clubbing, cyanosis, or deformity noted with normal full range of motion of all joints.  Trace edema. Neurologic:  No cranial nerve deficits noted. Station and gait are normal. Plantar reflexes are down-going bilaterally. DTRs are symmetrical throughout. Sensory, motor and coordinative functions appear intact.  Diabetes Management Exam:    Foot Exam (with socks and/or shoes not present):       Sensory-Monofilament:  Left foot: normal          Right foot: normal   Impression & Recommendations:  Problem # 1:  ANEMIA (ICD-285.9) was recently admitted to the hospital with low hg, she has a GI f/u next week, today will do cbc, pt denies any bleeding.   Her updated medication list for this problem includes:    Ferrous Sulfate 325 (65 Fe) Mg Tabs (Ferrous sulfate) .Marland Kitchen... Take 1 tablet by mouth two times a day for low blood iron  Orders: T-CBC No Diff ([id number])  Hgb: 8.1 (08/28/2009)   Hct: 25.1 (08/28/2009)   Platelets: 240 (08/28/2009) RBC: 3.25 (08/28/2009)   RDW: 18.6 (08/28/2009)   WBC: 9.1 (08/28/2009) MCV: 77.4 (08/28/2009)   MCHC: 32.3 (08/28/2009) B12: 330 (04/18/2008)   TSH: 0.997 (04/18/2008)  Problem # 2:   HYPERTENSION (ICD-401.9) pt was given lisinopril and hctz was d/c, now she is developing LE edema, therefore by herself she started to take her HCTZ again, after a discussion with the patient, will give her lisinopril-hctz combination for both swelling and kidney protection and bring back in 2 weeks for bmet. will also check bmet today as she was recently started on lisinopril.   Her updated medication list for this problem includes:    Prinzide 10-12.5 Mg Tabs (Lisinopril-hydrochlorothiazide) .Marland Kitchen... Take 1 tablet by mouth once a day  Orders: T-Basic Metabolic Panel ([id number])  BP today: 132/71 Prior BP: 114/62 (09/03/2009)  Labs Reviewed: K+: 4.1 (08/28/2009) Creat: : 0.81 (08/28/2009)   Chol: 111 (03/22/2009)   HDL: 34 (03/22/2009)   LDL: 45 (03/22/2009)   TG: 159 (03/22/2009)  Problem # 3:  AODM (ICD-250.00) Well controlled on current treatment, No new changes made today, Will continue to monitor and check a1c.   Her updated medication list for this problem includes:    Aspir-low 81 Mg Tbec (Aspirin) .Marland Kitchen... Take 1 tablet by mouth once a day    Lantus 100 Unit/ml Soln (Insulin glargine) ..... Inject 25 units subcutaneously daily and increase until fasting blood sugars are mostly less than 130 mg/dl    Prinzide 10-12.5 Mg Tabs (Lisinopril-hydrochlorothiazide) .Marland Kitchen... Take 1 tablet by mouth once a day  Orders: T- Capillary Blood Glucose ([id number]) T-Hgb A1C (in-house) ([id number])  Problem # 4:  COPD (ICD-496) at baseline.   Her updated medication list for this problem includes:    Albuterol 90 Mcg/act Aers (Albuterol) .Marland Kitchen..Marland Kitchen Two puffs every four hours as needed for shortness of breath    Spiriva Handihaler 18 Mcg Caps (Tiotropium bromide monohydrate) ..... Inhale contents of 1 capsule once a day    Advair Diskus 250-50 Mcg/dose Misc (Fluticasone-salmeterol) .Marland Kitchen... 1 puff twice daily  Complete Medication List: 1)  Prilosec 40 Mg Cpdr (Omeprazole) .... Take 1 tablet by mouth once a  day 2)  Alprazolam 0.5 Mg Tabs (Alprazolam) .... One pill daily as needed for anxiety 3)  Albuterol 90 Mcg/act Aers (Albuterol) .... Two puffs every four hours as needed for shortness of breath 4)  Spiriva Handihaler 18 Mcg Caps (Tiotropium bromide monohydrate) .... Inhale contents of 1 capsule once a day 5)  Crestor 20 Mg Tabs (Rosuvastatin calcium) .... Take 1 tablet by mouth once a day 6)  Advair Diskus 250-50 Mcg/dose Misc (Fluticasone-salmeterol) .Marland Kitchen.. 1 puff twice daily 7)  Cymbalta 30 Mg Cpep (Duloxetine hcl) .... Take 3  tables by mouth once daily 8)  Aspir-low 81 Mg Tbec (Aspirin) .... Take 1 tablet by mouth once a day 9)  Hydrocodone-acetaminophen 7.5-325 Mg Tabs (Hydrocodone-acetaminophen) .Marland KitchenMarland KitchenMarland Kitchen  Take 1 tablet every 4-6 hours by mouth as needed for the pain. 10)  Lantus 100 Unit/ml Soln (Insulin glargine) .... Inject 25 units subcutaneously daily and increase until fasting blood sugars are mostly less than 130 mg/dl 11)  Onetouch Ultra Test Strp (Glucose blood) .... Use to test blood sugars three times a day 12)  Insulin Syringe 31g X 5/16" 0.3 Ml Misc (Insulin syringe-needle u-100) .... Use to inject lantus insulin once daily 13)  Loratadine 10 Mg Tabs (Loratadine) .... Take 1 tablet by mouth once a day 14)  Wellbutrin Xl 150 Mg Xr24h-tab (Bupropion hcl) .... Take 1 tablet once daily 15)  Ferrous Sulfate 325 (65 Fe) Mg Tabs (Ferrous sulfate) .... Take 1 tablet by mouth two times a day for low blood iron 16)  Colace 100 Mg Caps (Docusate sodium) .... Take 1 tablet by mouth two times a day 17)  Prinzide 10-12.5 Mg Tabs (Lisinopril-hydrochlorothiazide) .... Take 1 tablet by mouth once a day  Patient Instructions: 1)  Please schedule a follow-up appointment in 2 weeks. Prescriptions: PRINZIDE 10-12.5 MG TABS (LISINOPRIL-HYDROCHLOROTHIAZIDE) Take 1 tablet by mouth once a day  #30 x 3   Entered and Authorized by:   Vertell Limber MD   Signed by:   Vertell Limber MD on 09/14/2009   Method  used:   Faxed to ...       [facility] (mail-order)       [address] [place], [place]  [address]       Ph: [phone]       Fax: [phone]   [place]:   [phone]  Process Orders Check Orders Results:     [facility]: Check successful Tests Sent for requisitioning (September 14, 2009 1:30 PM):     09/14/2009: [facility] -- T-CBC No Diff [[id number]] (signed)     09/14/2009: [facility] -- T-Basic Metabolic Panel [[id number]] (signed)     Prevention & Chronic Care Immunizations   Influenza vaccine: Historical  (10/23/2008)   Influenza vaccine deferral: Not indicated  (06/20/2009)   Influenza vaccine due: 11/27/2006    Tetanus booster: 10/19/2006: Td   Td booster deferral: Not indicated  (03/22/2009)   Tetanus booster due: 10/18/2016    Pneumococcal vaccine: Not documented    H. zoster vaccine: Not documented   H. zoster vaccine deferral: Not indicated  (08/28/2009)  Colorectal Screening   Hemoccult: Not documented   Hemoccult action/deferral: Not indicated  (08/28/2009)    Colonoscopy: normal  (08/18/2005)   Colonoscopy action/deferral: Not indicated  (03/22/2009)   Colonoscopy due: 08/19/2015  Other Screening   Pap smear: NEGATIVE FOR INTRAEPITHELIAL LESIONS OR MALIGNANCY.  (12/01/2008)   Pap smear action/deferral: GYN Referral  (11/15/2008)   Pap smear due: 12/2011    Mammogram: Assessment: BIRADS 1.   (08/03/2007)   Mammogram action/deferral: Not indicated  (03/22/2009)   Mammogram due: 08/02/2008    DXA bone density scan: Not documented   Smoking status: current  (09/14/2009)   Smoking cessation counseling: yes  (09/14/2009)  Diabetes Mellitus   HgbA1C: 5.6  (09/14/2009)   HgbA1C action/deferral: Ordered  (06/20/2009)   Hemoglobin A1C due: 08/26/2006    Eye exam: No diabetic retinopathy.     (09/09/2007)   Diabetic eye exam action/deferral: Refused  (06/20/2009)   Eye exam  due: 09/2008    Foot exam: yes  (09/14/2009)   High risk foot: Not documented   Foot care education: Not  documented    Urine microalbumin/creatinine ratio: 39.3  (11/15/2008)   Urine microalbumin action/deferral: Ordered   Urine microalbumin/cr due: 08/26/2006    Diabetes flowsheet reviewed?: Yes   Progress toward A1C goal: At goal  Lipids   Total Cholesterol: 111  (03/22/2009)   Lipid panel action/deferral: Lipid Panel ordered   LDL: 45  (03/22/2009)   LDL Direct: Not documented   HDL: 34  (03/22/2009)   Triglycerides: 159  (03/22/2009)   Lipid panel due: 11/27/2006    SGOT (AST): 14  (08/28/2009)   SGPT (ALT): 12  (08/28/2009)   Alkaline phosphatase: 38  (08/28/2009)   Total bilirubin: 0.4  (08/28/2009)    Lipid flowsheet reviewed?: Yes   Progress toward LDL goal: At goal  Hypertension   Last Blood Pressure: 132 / 71  (09/14/2009)   Serum creatinine: 0.81  (08/28/2009)   Serum potassium 4.1  (08/28/2009)    Hypertension flowsheet reviewed?: Yes   Progress toward BP goal: At goal  Self-Management Support :   Personal Goals (by the next clinic visit) :     Personal A1C goal: 7  (03/22/2009)     Personal blood pressure goal: 130/80  (03/22/2009)     Personal LDL goal: 100  (03/22/2009)    Patient will work on the following items until the next clinic visit to reach self-care goals:     Medications and monitoring: take my medicines every day, check my blood sugar, bring all of my medications to every visit, examine my feet every day  (09/14/2009)     Eating: drink diet soda or water instead of juice or soda, eat more vegetables, use fresh or frozen vegetables, eat foods that are low in salt, eat baked foods instead of fried foods, eat fruit for snacks and desserts, limit or avoid alcohol  (09/14/2009)     Activity: take a 30 minute walk every day  (09/14/2009)    Diabetes self-management support: Resources for patients handout, Written self-care plan  (09/14/2009)    Diabetes care plan printed   Last diabetes self-management training by diabetes educator: 06/20/2009    Hypertension self-management support: Resources for patients handout, Written self-care plan  (09/14/2009)   Hypertension self-care plan printed.    Lipid self-management support: Resources for patients handout, Written self-care plan  (09/14/2009)   Lipid self-care plan printed.      Resource handout printed.    Last LDL:                                                 45 (03/22/2009 8:02:00 PM)        Diabetic Foot Exam Last Podiatry Exam Date: 09/16/2006 Foot Inspection Is there a history of a foot ulcer?              No Is there a foot ulcer now?              No Can the patient see the bottom of their feet?          Yes Are the shoes appropriate in style and fit?          Yes Is there swelling or an abnormal foot shape?          Yes Are the toenails long?                No  Are the toenails thick?                No Are the toenails ingrown?              No Is there heavy callous build-up?              Yes Is there a claw toe deformity?                          No Is there elevated skin temperature?            No Is there limited ankle dorsiflexion?            Yes Is there foot or ankle muscle weakness?            No Do you have pain in calf while walking?           Yes      Comments: PATIENT STATES SHE GETS CRAMPS IN LEG AND FEET    10-g (5.07) Semmes-Weinstein Monofilament Test Performed by: Lucky Rathke NT II          Right Foot          Left Foot Visual Inspection     normal           normal Test Control      normal         normal Site 1         normal         normal Site 2         normal         normal Site 3         normal         normal Site 4         normal         normal Site 5         normal         normal Site 6         normal         normal Site 7         normal         normal Site 8         normal         normal Site 9         normal          normal Site 10         normal         normal  Impression      normal         normal  Legend:  Site 1 = Plantar aspect of first toe (center of pad) Site 2 = Plantar aspect of third toe (center of pad) Site 3 = Plantar aspect of fifth toe (center of pad) Site 4 = Plantar aspect of first metatarsal head Site 5 = Plantar aspect of third metatarsal head Site 6 = Plantar aspect of fifth metatarsal head Site 7 = Plantar aspect of medial midfoot Site 8 = Plantar aspect of lateral midfoot Site 9 = Plantar aspect of heel Site 10 = dorsal aspect of foot between the base of the first and second toes   Result is Abnormal if patient was unable to perceive the monofilament at site indicated.   Laboratory Results   Blood Tests   Date/Time Received: September 14, 2009 11:15  AM Date/Time Reported: Maryan Rued  September 14, 2009 11:15 AM   HGBA1C: 5.6%   (Normal Range: Non-Diabetic - 3-6%   Control Diabetic - 6-8%) CBG Random:: 115m/dL

## 2010-03-12 NOTE — Assessment & Plan Note (Signed)
Summary: EST-1 MONTH F/U/CH   Vital Signs:  Patient profile:   60 year old female Height:      64 inches (162.56 cm) Weight:      168.5 pounds (76.59 kg) BMI:     29.03 Temp:     97.5 degrees F (36.39 degrees C) oral Pulse rate:   86 / minute BP sitting:   119 / 66  (right arm)  Vitals Entered By: Hilda Blades Ditzler RN (October 02, 2009 2:19 PM) Is Patient Diabetic? Yes Did you bring your meter with you today? No Pain Assessment Patient in pain? yes     Location: h/a Intensity: 3-4 Type: hurting Onset of pain  today Nutritional Status BMI of 25 - 29 = overweight Nutritional Status Detail appetite good CBG Result 196  Have you ever been in a relationship where you felt threatened, hurt or afraid?denies   Does patient need assistance? Functional Status Self care Ambulation Normal Comments FU ? labs. Needs Colace faxed to [facility] Diadetic shoes.   Primary Care Provider:  Trinidad Curet MD   History of Present Illness: 60 yo female with PMH outlined below presents to [facility] [place] for regular follow up appointment. She has no concerns at the time. No recent sicknesses or hospitalizaitons. No episodes of chest pain, SOB, palpitations, no fever or chills. No specific abdominal or urinary concerns. No recent changes in appetite, weight, sleep patterns, mood.  She needs different pain medicine since the current one that she was recently switched to is not working. She also needs medicine for constipation.   Depression History:      The patient denies a depressed mood most of the day and a diminished interest in her usual daily activities.  The patient denies significant weight loss, significant weight gain, insomnia, hypersomnia, psychomotor agitation, psychomotor retardation, fatigue (loss of energy), feelings of worthlessness (guilt), impaired concentration (indecisiveness), and recurrent thoughts of death or suicide.        The patient denies that she feels like life is not worth living,  denies that she wishes that she were dead, and denies that she has thought about ending her life.         Preventive Screening-Counseling & Management  Alcohol-Tobacco     Alcohol drinks/day: 0     Smoking Status: current     Smoking Cessation Counseling: yes     Packs/Day: 1.5     Year Started: AT THE AGE OF 12  Caffeine-Diet-Exercise     Does Patient Exercise: yes     Type of exercise: WALKING  Problems Prior to Update: 1)  Pulmonary Hypertension  (ICD-416.8) 2)  Hypertension  (ICD-401.9) 3)  Mitral Regurgitation  (ICD-396.3) 4)  Ruq Pain  (ICD-789.01) 5)  Microalbuminuria  (ICD-791.0) 6)  Dyslipidemia  (ICD-272.4) 7)  Hypertriglyceridemia  (ICD-272.1) 8)  Dental Pain  (ICD-525.9) 9)  Shingles, Hx of  (ICD-V13.8) 10)  Anemia  (ICD-285.9) 11)  Arthropathy Nec, Other Spec Site  (ICD-716.88) 12)  Aodm  (ICD-250.00) 13)  Colonic Polyps  (ICD-211.3) 14)  Osteoporosis  (ICD-733.00) 15)  COPD  (ICD-496) 16)  Obstructive Sleep Apnea  (ICD-780.57) 17)  Depression  (ICD-311) 18)  Fibromyalgia  (ICD-729.1) 19)  Irritable Bowel Syndrome  (ICD-564.1) 20)  Gerd  (ICD-530.81)  Medications Prior to Update: 1)  Prilosec 40 Mg Cpdr (Omeprazole) .... Take 1 Tablet By Mouth Once A Day 2)  Alprazolam 0.5 Mg  Tabs (Alprazolam) .... One Pill Daily As Needed For Anxiety 3)  Albuterol 90  Mcg/act Aers (Albuterol) .... Two Puffs Every Four Hours As Needed For Shortness of Breath 4)  Spiriva Handihaler 18 Mcg Caps (Tiotropium Bromide Monohydrate) .... Inhale Contents of 1 Capsule Once A Day 5)  Crestor 20 Mg  Tabs (Rosuvastatin Calcium) .... Take 1 Tablet By Mouth Once A Day 6)  Advair Diskus 250-50 Mcg/dose Misc (Fluticasone-Salmeterol) .Marland Kitchen.. 1 Puff Twice Daily 7)  Cymbalta 30 Mg Cpep (Duloxetine Hcl) .... Take 3  Tables By Mouth Once Daily 8)  Aspir-Low 81 Mg Tbec (Aspirin) .... Take 1 Tablet By Mouth Once A Day 9)  Hydrocodone-Acetaminophen 7.5-325 Mg Tabs (Hydrocodone-Acetaminophen) .... Take  1 Tablet Every 4-6 Hours By Mouth As Needed For The Pain. 10)  Lantus 100 Unit/ml Soln (Insulin Glargine) .... Inject 25 Units Subcutaneously Daily and Increase Until Fasting Blood Sugars Are Mostly Less Than 130 Mg/dl 11)  Onetouch Ultra Test  Strp (Glucose Blood) .... Use To Test Blood Sugars Three Times A Day 12)  Insulin Syringe 31g X 5/16" 0.3 Ml Misc (Insulin Syringe-Needle U-100) .... Use To Inject Lantus Insulin Once Daily 13)  Loratadine 10 Mg Tabs (Loratadine) .... Take 1 Tablet By Mouth Once A Day 14)  Wellbutrin Xl 150 Mg Xr24h-Tab (Bupropion Hcl) .... Take 1 Tablet Once Daily 15)  Ferrous Sulfate 325 (65 Fe) Mg Tabs (Ferrous Sulfate) .... Take 1 Tablet By Mouth Two Times A Day For Low Blood Iron 16)  Colace 100 Mg Caps (Docusate Sodium) .... Take 1 Tablet By Mouth Two Times A Day 17)  Prinzide 10-12.5 Mg Tabs (Lisinopril-Hydrochlorothiazide) .... Take 1 Tablet By Mouth Once A Day  Current Medications (verified): 1)  Prilosec 40 Mg Cpdr (Omeprazole) .... Take 1 Tablet By Mouth Once A Day 2)  Alprazolam 0.5 Mg  Tabs (Alprazolam) .... One Pill Daily As Needed For Anxiety 3)  Albuterol 90 Mcg/act Aers (Albuterol) .... Two Puffs Every Four Hours As Needed For Shortness of Breath 4)  Spiriva Handihaler 18 Mcg Caps (Tiotropium Bromide Monohydrate) .... Inhale Contents of 1 Capsule Once A Day 5)  Crestor 20 Mg  Tabs (Rosuvastatin Calcium) .... Take 1 Tablet By Mouth Once A Day 6)  Advair Diskus 250-50 Mcg/dose Misc (Fluticasone-Salmeterol) .Marland Kitchen.. 1 Puff Twice Daily 7)  Cymbalta 30 Mg Cpep (Duloxetine Hcl) .... Take 3  Tables By Mouth Once Daily 8)  Aspir-Low 81 Mg Tbec (Aspirin) .... Take 1 Tablet By Mouth Once A Day 9)  Hydrocodone-Acetaminophen 7.5-325 Mg Tabs (Hydrocodone-Acetaminophen) .... Take 1 Tablet Every 4-6 Hours By Mouth As Needed For The Pain. 10)  Lantus 100 Unit/ml Soln (Insulin Glargine) .... Inject 25 Units Subcutaneously Daily and Increase Until Fasting Blood Sugars Are  Mostly Less Than 130 Mg/dl 11)  Onetouch Ultra Test  Strp (Glucose Blood) .... Use To Test Blood Sugars Three Times A Day 12)  Insulin Syringe 31g X 5/16" 0.3 Ml Misc (Insulin Syringe-Needle U-100) .... Use To Inject Lantus Insulin Once Daily 13)  Loratadine 10 Mg Tabs (Loratadine) .... Take 1 Tablet By Mouth Once A Day 14)  Wellbutrin Xl 150 Mg Xr24h-Tab (Bupropion Hcl) .... Take 1 Tablet Once Daily 15)  Ferrous Sulfate 325 (65 Fe) Mg Tabs (Ferrous Sulfate) .... Take 1 Tablet By Mouth Two Times A Day For Low Blood Iron 16)  Colace 100 Mg Caps (Docusate Sodium) .... Take 1 Tablet By Mouth Two Times A Day 17)  Prinzide 10-12.5 Mg Tabs (Lisinopril-Hydrochlorothiazide) .... Take 1 Tablet By Mouth Once A Day  Allergies (verified): 1)  ! Ultram 2)  !  Morphine  Past History:  Past Medical History: Last updated: 09/05/2009 PULMONARY HYPERTENSION  HYPERTENSION MITRAL REGURGITATION RUQ PAIN  MICROALBUMINURIA DYSLIPIDEMIA  HYPERTRIGLYCERIDEMIA DENTAL PAIN SHINGLES, HX OF ANEMIA  ARTHROPATHY NEC, OTHER SPEC SITE  AODM COLONIC POLYPS OSTEOPOROSIS  COPD  OBSTRUCTIVE SLEEP APNEA  DEPRESSION  FIBROMYALGIA  IRRITABLE BOWEL SYNDROME  GERD  Diabetes mellitus, type I Anxiety Low back pain Chronic diffuse pain involving the back, arms, and legs  Past Surgical History: Last updated: 09/05/2009  orthodontic procedure   Family History: Last updated: 11/30/2008 No significant family medical history  Social History: Last updated: 12/07/2007 Lives alone.  No EtOH.  No illegal drugs.  Poor relationship with children.    Risk Factors: Alcohol Use: 0 (10/02/2009) Exercise: yes (10/02/2009)  Risk Factors: Smoking Status: current (10/02/2009) Packs/Day: 1.5 (10/02/2009)  Family History: Reviewed history from 11/30/2008 and no changes required. No significant family medical history  Social History: Reviewed history from 12/07/2007 and no changes required. Lives alone.  No EtOH.   No illegal drugs.  Poor relationship with children.    Review of Systems       per HPI  Physical Exam  General:  Well-developed,well-nourished,in no acute distress; alert,appropriate and cooperative throughout examination Lungs:  normal respiratory effort, normal breath sounds, no crackles, and no wheezes.   Heart:  normal rate, regular rhythm, no murmur, no gallop, and no rub.   Abdomen:  soft, non-tender, and normal bowel sounds.   Neurologic:  No cranial nerve deficits noted. Station and gait are normal. Plantar reflexes are down-going bilaterally. DTRs are symmetrical throughout. Sensory, motor and coordinative functions appear intact. Psych:  Cognition and judgment appear intact. Alert and cooperative with normal attention span and concentration. No apparent delusions, illusions, hallucinations   Impression & Recommendations:  Problem # 1:  HYPERTENSION (ICD-401.9) At goal, will cont the same regimen and will check bmet today for creatinine levels.  Her updated medication list for this problem includes:    Prinzide 10-12.5 Mg Tabs (Lisinopril-hydrochlorothiazide) .Marland Kitchen... Take 1 tablet by mouth once a day  Orders: T-Basic Metabolic Panel ([id number])  BP today: 119/66 Prior BP: 132/71 (09/14/2009)  Labs Reviewed: K+: 4.6 (09/14/2009) Creat: : 0.80 (09/14/2009)   Chol: 111 (03/22/2009)   HDL: 34 (03/22/2009)   LDL: 45 (03/22/2009)   TG: 159 (03/22/2009)  Problem # 2:  ANEMIA (ICD-285.9) Reports no active bleeding and  will check cbc today.  Her updated medication list for this problem includes:    Ferrous Sulfate 325 (65 Fe) Mg Tabs (Ferrous sulfate) .Marland Kitchen... Take 1 tablet by mouth two times a day for low blood iron  Orders: T-CBC No Diff ([id number])  Hgb: 10.3 (09/14/2009)   Hct: 35.5 (09/14/2009)   Platelets: 253 (09/14/2009) RBC: 4.04 (09/14/2009)   RDW: 21.6 (09/14/2009)   WBC: 8.6 (09/14/2009) MCV: 87.9 (09/14/2009)   MCHC: 29.0 (09/14/2009) B12: 330 (04/18/2008)    TSH: 0.997 (04/18/2008)  Problem # 3:  COPD (ICD-496) At goal, cont the same regimen.  Her updated medication list for this problem includes:    Albuterol 90 Mcg/act Aers (Albuterol) .Marland Kitchen..Marland Kitchen Two puffs every four hours as needed for shortness of breath    Spiriva Handihaler 18 Mcg Caps (Tiotropium bromide monohydrate) ..... Inhale contents of 1 capsule once a day    Advair Diskus 250-50 Mcg/dose Misc (Fluticasone-salmeterol) .Marland Kitchen... 1 puff twice daily  Complete Medication List: 1)  Prilosec 40 Mg Cpdr (Omeprazole) .... Take 1 tablet by mouth once a day 2)  Alprazolam 0.5 Mg Tabs (  Alprazolam) .... One pill daily as needed for anxiety 3)  Albuterol 90 Mcg/act Aers (Albuterol) .... Two puffs every four hours as needed for shortness of breath 4)  Spiriva Handihaler 18 Mcg Caps (Tiotropium bromide monohydrate) .... Inhale contents of 1 capsule once a day 5)  Crestor 20 Mg Tabs (Rosuvastatin calcium) .... Take 1 tablet by mouth once a day 6)  Advair Diskus 250-50 Mcg/dose Misc (Fluticasone-salmeterol) .Marland Kitchen.. 1 puff twice daily 7)  Cymbalta 30 Mg Cpep (Duloxetine hcl) .... Take 3  tables by mouth once daily 8)  Aspir-low 81 Mg Tbec (Aspirin) .... Take 1 tablet by mouth once a day 9)  Percocet 5-325 Mg Tabs (Oxycodone-acetaminophen) .... Take 1 tablet every 4-6 hours as needed for pain 10)  Lantus 100 Unit/ml Soln (Insulin glargine) .... Inject 25 units subcutaneously daily and increase until fasting blood sugars are mostly less than 130 mg/dl 11)  Onetouch Ultra Test Strp (Glucose blood) .... Use to test blood sugars three times a day 12)  Insulin Syringe 31g X 5/16" 0.3 Ml Misc (Insulin syringe-needle u-100) .... Use to inject lantus insulin once daily 13)  Loratadine 10 Mg Tabs (Loratadine) .... Take 1 tablet by mouth once a day 14)  Wellbutrin Xl 150 Mg Xr24h-tab (Bupropion hcl) .... Take 1 tablet once daily 15)  Ferrous Sulfate 325 (65 Fe) Mg Tabs (Ferrous sulfate) .... Take 1 tablet by mouth two times  a day for low blood iron 16)  Colace 100 Mg Caps (Docusate sodium) .... Take 1 tablet by mouth two times a day 17)  Prinzide 10-12.5 Mg Tabs (Lisinopril-hydrochlorothiazide) .... Take 1 tablet by mouth once a day 18)  Glycerin (adult) 2 Gm Supp (Glycerin (laxative)) .... Use 1-2 supp per day for constipation  Other Orders: Capillary Blood Glucose/CBG ([id number]) Capillary Blood Glucose/CBG ([id number])  Patient Instructions: 1)  Please schedule a follow-up appointment in 3 months. 2)  Please check your blood pressure regularly, if it is >170 please call clinic at [phone] Prescriptions: PERCOCET 5-325 MG TABS (OXYCODONE-ACETAMINOPHEN) take 1 tablet every 4-6 hours as needed for pain  #120 x 0   Entered and Authorized by:   Trinidad Curet MD   Signed by:   Trinidad Curet MD on 10/02/2009   Method used:   Print then Give to Patient   RxID:   [id number] COLACE 100 MG CAPS (DOCUSATE SODIUM) Take 1 tablet by mouth two times a day  #60 x 2   Entered and Authorized by:   Trinidad Curet MD   Signed by:   Trinidad Curet MD on 10/02/2009   Method used:   Faxed to ...       [facility] (mail-order)       [address] [place], [place]  [address]       Ph: [phone]       Fax: [phone]   RxID:   [id number] [place] (ADULT) 2 GM SUPP (GLYCERIN (LAXATIVE)) use 1-2 supp per day for constipation  #60 x 0   Entered and Authorized by:   Trinidad Curet MD   Signed by:   Trinidad Curet MD on 10/02/2009   Method used:   Faxed to ...       [facility] (mail-order)       [address] [place], [place]  [address]       Ph: [phone]       Fax: [phone]  RxID:   [id number]   Prevention & Chronic Care Immunizations   Influenza vaccine: Historical  (10/23/2008)   Influenza vaccine deferral: Not indicated  (06/20/2009)   Influenza vaccine due: 11/27/2006    Tetanus booster: 10/19/2006: Td   Td booster deferral: Not indicated  (03/22/2009)    Tetanus booster due: 10/18/2016    Pneumococcal vaccine: Not documented    H. zoster vaccine: Not documented   H. zoster vaccine deferral: Not indicated  (08/28/2009)  Colorectal Screening   Hemoccult: Not documented   Hemoccult action/deferral: Not indicated  (08/28/2009)    Colonoscopy: normal  (08/18/2005)   Colonoscopy action/deferral: Not indicated  (03/22/2009)   Colonoscopy due: 08/19/2015  Other Screening   Pap smear: NEGATIVE FOR INTRAEPITHELIAL LESIONS OR MALIGNANCY.  (12/01/2008)   Pap smear action/deferral: GYN Referral  (11/15/2008)   Pap smear due: 12/2011    Mammogram: Assessment: BIRADS 1.   (08/03/2007)   Mammogram action/deferral: Not indicated  (03/22/2009)   Mammogram due: 08/02/2008    DXA bone density scan: Not documented   Smoking status: current  (10/02/2009)   Smoking cessation counseling: yes  (10/02/2009)  Diabetes Mellitus   HgbA1C: 5.6  (09/14/2009)   HgbA1C action/deferral: Ordered  (06/20/2009)   Hemoglobin A1C due: 08/26/2006    Eye exam: No diabetic retinopathy.     (09/09/2007)   Diabetic eye exam action/deferral: Refused  (06/20/2009)   Eye exam due: 09/2008    Foot exam: yes  (09/14/2009)   High risk foot: Not documented   Foot care education: Not documented    Urine microalbumin/creatinine ratio: 39.3  (11/15/2008)   Urine microalbumin action/deferral: Ordered   Urine microalbumin/cr due: 08/26/2006    Diabetes flowsheet reviewed?: Yes   Progress toward A1C goal: At goal  Lipids   Total Cholesterol: 111  (03/22/2009)   Lipid panel action/deferral: Lipid Panel ordered   LDL: 45  (03/22/2009)   LDL Direct: Not documented   HDL: 34  (03/22/2009)   Triglycerides: 159  (03/22/2009)   Lipid panel due: 11/27/2006    SGOT (AST): 14  (08/28/2009)   SGPT (ALT): 12  (08/28/2009)   Alkaline phosphatase: 38  (08/28/2009)   Total bilirubin: 0.4  (08/28/2009)    Lipid flowsheet reviewed?: Yes   Progress toward LDL goal: At  goal  Hypertension   Last Blood Pressure: 119 / 66  (10/02/2009)   Serum creatinine: 0.80  (09/14/2009)   Serum potassium 4.6  (09/14/2009)    Hypertension flowsheet reviewed?: Yes   Progress toward BP goal: At goal  Self-Management Support :   Personal Goals (by the next clinic visit) :     Personal A1C goal: 7  (03/22/2009)     Personal blood pressure goal: 130/80  (03/22/2009)     Personal LDL goal: 100  (03/22/2009)    Patient will work on the following items until the next clinic visit to reach self-care goals:     Medications and monitoring: take my medicines every day, check my blood sugar, bring all of my medications to every visit, examine my feet every day  (10/02/2009)     Eating: drink diet soda or water instead of juice or soda, eat more vegetables, use fresh or frozen vegetables, eat foods that are low in salt, eat baked foods instead of fried foods, eat fruit for snacks and desserts, limit or avoid alcohol  (10/02/2009)     Activity: take a 30 minute walk every day  (09/14/2009)    Diabetes self-management support: Written  self-care plan, Education handout, Resources for patients handout  (10/02/2009)   Diabetes care plan printed   Diabetes education handout printed   Last diabetes self-management training by diabetes educator: 06/20/2009    Hypertension self-management support: Written self-care plan, Education handout, Resources for patients handout  (10/02/2009)   Hypertension self-care plan printed.   Hypertension education handout printed    Lipid self-management support: Written self-care plan, Education handout, Resources for patients handout  (10/02/2009)   Lipid self-care plan printed.   Lipid education handout printed      Resource handout printed.

## 2010-03-12 NOTE — Miscellaneous (Signed)
Summary: Letter/Jury Duty  Letter in support of disability and difficulty serving on jury.

## 2010-03-12 NOTE — Miscellaneous (Signed)
Summary: Progress Note  Progress Note   Imported By: Bonner Puna 09/23/2007 14:55:31  _____________________________________________________________________  External Attachment:    Type:   Image     Comment:   External Document

## 2010-03-12 NOTE — Letter (Signed)
Summary: MeterDownLoad  MeterDownLoad   Imported By: Bonner Puna 05/02/2008 12:05:21  _____________________________________________________________________  External Attachment:    Type:   Image     Comment:   External Document

## 2010-03-12 NOTE — Progress Notes (Signed)
Summary: refill/gg  Phone Note Refill Request  on August 09, 2008 9:47 AM  Pt called and is not able to tolerate glucophage 1000 mg.  she is having GI upset.  Please change back to glucophage XL 500 mg 2 pills two times a day    Method Requested: Electronic Initial call taken by: Gevena Cotton RN,  August 09, 2008 9:47 AM  Follow-up for Phone Call        ok change made - escript sent; side effect noted    New/Updated Medications: GLUCOPHAGE XR 500 MG XR24H-TAB (METFORMIN HCL) 2 pills twice a day   Prescriptions: GLUCOPHAGE XR 500 MG XR24H-TAB (METFORMIN HCL) 2 pills twice a day  #120 x 5   Entered and Authorized by:   Thomes Lolling MD   Signed by:   Thomes Lolling MD on 08/09/2008   Method used:   Electronically to        [facility] [phone]* (retail)       [address]       [place], [place]  [address]       Ph: [phone]       Fax: [phone]   RxID:   [id number]

## 2010-03-12 NOTE — Assessment & Plan Note (Signed)
Summary: dsmt/dmr    Current Allergies: ! ULTRAM ! MORPHINE                  Diabetes Self Management Training  PCP:  Lucy Chris MD Diabetes Type: Type 2 non-insulin treated Current smoking Status: current     Assessment Work Hours: Not currently working Special needs or Barriers: wants a dental clinic referral      Last Physician foot exam:  09/16/2006   Diabetes Disease Process Define diabetes in simple terms: Demonstrates competency State own type of diabetes: Demonstrates competency State diabetes is treated by meal plan-exercise-medication-monitoring-education: Demonstrates competency  Medications State name-action-dose-duration-side effects-and time to take medication: Demonstrates competency State appropriate timing of food related to medication: Demonstrates competency  Describe safe needle/lancet disposal: Demonstrates competency  Nutritional Management Identify what foods most often affect blood glucose: Demonstrates competency Verbalize importance of controlling food portions: Demonstrates competency State importance of spacing and not omitting meals and snacks: Demonstrates competency State changes planned for home meals/snacks: Demonstrates competency  Monitoring State purpose and frequency of monitoring BG-ketones-HgbA1C and when to contact health care team with results: Demonstrates competency Perform glucose monitoring/ketone testing and record results correctly: Demonstrates competency State target blood glucose and HgbA1C goals: Demonstrates competency  Preconception care-Pregnancy-GDM management ( if applicable)                                                                      Not applicable  Psychosocial Adjustment State three common feelings that might be experienced when learning to cope with diabetes: Demonstrates competency Identify two methods to cope with these feelings: Demonstrates competency Identify how stress  affects diabetes & two sources of stress: Demonstrates competency Name two ways of obtaining support from family/friends: Needs review/assistance Diabetes Management Education Done: 09/16/2006        Nikita's reason for this visit : wants to be sure she has all teh informaiton she needs to take care of her diabetes. Discussed feelings and support today. Her plan is to f/u in future to review/learn other areas.  Last LDL:                                                 76 (11/26/2005 9:22:00 AM)

## 2010-03-12 NOTE — Consult Note (Signed)
Summary: [facility]  [facility]   Imported By: Lacy Duverney 12/28/2009 10:58:33  _____________________________________________________________________  External Attachment:    Type:   Image     Comment:   External Document

## 2010-03-12 NOTE — Progress Notes (Signed)
Summary: refill/ hla  Phone Note Refill Request Message from:  Fax from Pharmacy on August 21, 2008 10:02 AM  Refills Requested: Medication #1:  HYDROCODONE-IBUPROFEN 7.5-200 MG TABS Take one tab by mouth once every 6 hours as needed for pain   Last Refilled: 4/24 Initial call taken by: Freddy Finner RN,  August 21, 2008 10:02 AM  Follow-up for Phone Call        last refilled 12/09 and ? if done for acute post herpetic neuralgia. I am not opposed to filling this one time but she shoudl have an apptment to readdress.  WIll give 60 tabs and no refill.s Please call in and ask pt to address with her MD at next visit. THanks Follow-up by: Adrian Prows MD,  August 23, 2008 12:26 PM  Additional Follow-up for Phone Call Additional follow up Details #1::        I agree that this patient needs to have an appointment scheduled prior to next refill. Do we have pain contract singed?  Thanks Iskra Additional Follow-up by: Trinidad Curet MD,  August 23, 2008 1:24 PM    New/Updated Medications: HYDROCODONE-IBUPROFEN 7.5-200 MG TABS (HYDROCODONE-IBUPROFEN) Take one tab by mouth once every 6 hours as needed for pain   Prescriptions: HYDROCODONE-IBUPROFEN 7.5-200 MG TABS (HYDROCODONE-IBUPROFEN) Take one tab by mouth once every 6 hours as needed for pain  #60 x 0   Entered by:   Adrian Prows MD   Authorized by:   Trinidad Curet MD   Signed by:   Adrian Prows MD on 08/23/2008   Method used:   Telephoned to ...       [facility] [phone]* (retail)       [address]       [place], [place]  [address]       Ph: [phone]       Fax: [phone]   RxID:   [id number]

## 2010-03-12 NOTE — Progress Notes (Signed)
Summary: refill/ hla  Phone Note Call from Patient   Summary of Call: pt calls to say the pain is very bad and the vicoprofen is not helping, she has also used advil and warm compresses to no avail. please advise. Initial call taken by: Freddy Finner RN,  November 16, 2008 1:56 PM  Follow-up for Phone Call        she needs to be seen by a dentist as soon as possible. if she can't wait until that appointment, she should go to the ED for emergent tooth extraction. Follow-up by: Yolonda Kida MD,  November 16, 2008 8:55 PM

## 2010-03-12 NOTE — Assessment & Plan Note (Signed)
Summary: per drk-reynolds/cpap,gi,cbgs hi/pcp-magick/hla   Vital Signs:  Patient profile:   60 year old female Height:      64 inches (162.56 cm) Weight:      174.6 pounds (78.86 kg) BMI:     30.08 Temp:     97.1 degrees F (36.17 degrees C) oral Pulse rate:   95 / minute BP sitting:   113 / 72  (right arm) Cuff size:   large  Vitals Entered By: Lucky Rathke NT II (January 10, 2010 3:14 PM) CC: MEDICATION REFILL  /   Is Patient Diabetic? Yes Did you bring your meter with you today? Yes Pain Assessment Patient in pain? yes     Location: ALL OVER Intensity:      7 Type: ACHE Onset of pain  Chronic Nutritional Status BMI of > 30 = obese  Have you ever been in a relationship where you felt threatened, hurt or afraid?No   Does patient need assistance? Functional Status Self care Ambulation Normal   Diabetic Foot Exam Last Podiatry Exam Date: 09/16/2006  Set Next Diabetic Foot Exam here: 09/15/2010   Primary Care Provider:  Trinidad Curet MD  CC:  MEDICATION REFILL  /  .  History of Present Illness: Pt is a 60 yo F with PMHx of DMII, fibromyalgia, COPD, who presents to clinic today with multiple medical concerns, including the following:  1) DMII - Increased lantus to 36 units. No hypoglycemia. Reports blood sugars ranging between 135-319 preprandial in breakfast, prepprandial to lunch 169-280 mg/dL, post-prandial to dinner 243-314.  Last HgA1c in 09/11 was 7.3 from 5.6 in 08/11. + Polyuria, polydipsia, no blurry vision, no nausea, no vomiting.   2) Abdominal pain - tight, nonradiating, intermittent epigastric pain occurs once daily, usually after meals, occurs at rest.  Worse with caffeine. No palpitations, no tachycardia. Had an EGD recently which showed gastritis, was increased to Prilosec BId.    3) OSA with CPAP - had sleep study in 06/2009 showing moderate sleep apnea-hypopnea syndrome CpAP titration to 12 CWP. Patient spoke with me regarding CPAP, states she is  feeling much better regarding her sleep, sleeps better throughout night, requiring less naps during day, less daytime sleepiness.   4) Preventative Care - pt aware needs eye exam, awaiting getting money for referral. Due for mammogram.      Preventive Screening-Counseling & Management  Alcohol-Tobacco     Alcohol drinks/day: 0     Smoking Status: current     Smoking Cessation Counseling: yes     Packs/Day: 1.5     Year Started: AT THE AGE OF 12  Caffeine-Diet-Exercise     Does Patient Exercise: yes     Type of exercise: WALKING  EGD  Procedure date:  12/07/2009  Findings:      Gastritis, normal duodenum  Comments:      pending biopsy  Current Medications (verified): 1)  Prilosec 40 Mg Cpdr (Omeprazole) .... Take 2 Tablets By Mouth Once A Day 2)  Alprazolam 0.5 Mg  Tabs (Alprazolam) .... One Pill Daily As Needed For Anxiety 3)  Albuterol 90 Mcg/act Aers (Albuterol) .... Two Puffs Every Four Hours As Needed For Shortness of Breath 4)  Spiriva Handihaler 18 Mcg Caps (Tiotropium Bromide Monohydrate) .... Inhale Contents of 1 Capsule Once A Day 5)  Crestor 20 Mg  Tabs (Rosuvastatin Calcium) .... Take 1 Tablet By Mouth Once A Day 6)  Advair Diskus 250-50 Mcg/dose Misc (Fluticasone-Salmeterol) .Marland Kitchen.. 1 Puff Twice Daily 7)  Cymbalta  30 Mg Cpep (Duloxetine Hcl) .... Take 3  Tables By Mouth Once Daily 8)  Aspir-Low 81 Mg Tbec (Aspirin) .... Take 1 Tablet By Mouth Once A Day 9)  Lantus 100 Unit/ml Soln (Insulin Glargine) .... Inject 36 Units Subcutaneously Daily 10)  Onetouch Ultra Test  Strp (Glucose Blood) .... Use To Test Blood Sugars Three Times A Day 11)  Insulin Syringe 31g X 5/16" 0.3 Ml Misc (Insulin Syringe-Needle U-100) .... Use To Inject Lantus Insulin Once Daily 12)  Loratadine 10 Mg Tabs (Loratadine) .... Take 1 Tablet By Mouth Once A Day 13)  Wellbutrin Xl 150 Mg Xr24h-Tab (Bupropion Hcl) .... Take 1 Tablet Once Daily 14)  Colace 100 Mg Caps (Docusate Sodium) .... Take 1  Tablet By Mouth Two Times A Day 15)  Prinzide 10-12.5 Mg Tabs (Lisinopril-Hydrochlorothiazide) .... Take 1 Tablet By Mouth Once A Day 16)  Th Flax Seed Oil 1000 Mg Caps (Flaxseed (Linseed)) .... 2 Tablespoons Daily 17)  Norco 10-325 Mg Tabs (Hydrocodone-Acetaminophen) .... Take 1 Tablet By Mouth Four Times A Day As Needed Pain  Allergies: 1)  ! Ultram 2)  ! Morphine  Review of Systems       per HPI  Physical Exam  General:  Vital signs reviewed and noted. Well-developed,well-nourished,in no acute distress; alert,appropriate and cooperative throughout examination. Head: normocephalic, atraumatic. Neck: No deformities, masses, or tenderness noted. Lungs: Normal respiratory effort. Clear to auscultation BL without crackles or wheezes.  Heart: RRR. S1 and S2 normal without gallop, murmur, or rubs.  Abdomen: BS normoactive. Soft, Nondistended, non-tender.  No masses or organomegaly. Extremities: No pretibial edema.     Impression & Recommendations:  Problem # 1:  AODM (ICD-250.00) Assessment Deteriorated Still with persistent elevated blood sugars as day progresses, however, pt not recording preprandial dinner sugars, although post-prandial still elevated to 200s. - Will progress Lantus to 38 units - Will start Novolog for hyperglycemia prior to meals - Will check HgA1c next visit - Encouraged to check BG at least 3 times day, preprandial to meals, and ideally before bedtime.  - Will have patient submit glucometer logs in 1-2 weeks for review of control with new regimen, plan adjustment as indicated   Her updated medication list for this problem includes:    Aspir-low 81 Mg Tbec (Aspirin) .Marland Kitchen... Take 1 tablet by mouth once a day    Lantus 100 Unit/ml Soln (Insulin glargine) ..... Inject 38 units subcutaneously daily    Prinzide 10-12.5 Mg Tabs (Lisinopril-hydrochlorothiazide) .Marland Kitchen... Take 1 tablet by mouth once a day    Novolog 100 Unit/ml Soln (Insulin aspart) ..... Inject 3 units  subcutaneously 15 minutesbefore lunch.  Problem # 2:  HYPERTENSION (ICD-401.9) Great control. Continue current    Her updated medication list for this problem includes:    Prinzide 10-12.5 Mg Tabs (Lisinopril-hydrochlorothiazide) .Marland Kitchen... Take 1 tablet by mouth once a day  Problem # 3:  OBSTRUCTIVE SLEEP APNEA (ICD-780.57) Assessment: Improved Well controlled with current CPAP. - Continue current  Problem # 4:  Preventive Health Care (ICD-V70.0) Assessment: Comment Only Last PAP:                              11/2008 - wnl                                                     -->  plan next visit Last mammogram:                 07/2007 - BIRADS 1                                           --> order today Last colonoscopy:                 08/2005 - wnl                                                    --> due 08/2015 Last FLP:                               03/2009 - Tchol 111, LDL 45, HDL 34, Trig 159 --> due 03/2010 Last HgA1c:                          11/08/09 - 7.3                                                     --> due 02/07/10 Last Microalbumin/Cr ratio:    10/2009 - 13.9                                                    --> due 10/2010 Last foot exam:                     09/2009 - wnl                                                     --> due 09/2010 Last Diabetic eye exam:        08/2007 - wnl                                                     --> due now, pt cannot afford, urged to complete ASAP. Last CMP:                              09/2009 - BUN 11, Cr 0.82, AST 14, ALT 12, Alk Phos 38, T bili 0.4 --> due 03/2010 Pneumovax:                          Will review echart records, if none given           --> give next visit  Complete Medication List: 1)  Prilosec 40  Mg Cpdr (Omeprazole) .... Take 2 tablets by mouth once a day 2)  Alprazolam 0.5 Mg Tabs (Alprazolam) .... One pill daily as needed for anxiety 3)  Albuterol 90 Mcg/act Aers (Albuterol) .... Two puffs every four hours as  needed for shortness of breath 4)  Spiriva Handihaler 18 Mcg Caps (Tiotropium bromide monohydrate) .... Inhale contents of 1 capsule once a day 5)  Crestor 20 Mg Tabs (Rosuvastatin calcium) .... Take 1 tablet by mouth once a day 6)  Advair Diskus 250-50 Mcg/dose Misc (Fluticasone-salmeterol) .Marland Kitchen.. 1 puff twice daily 7)  Cymbalta 30 Mg Cpep (Duloxetine hcl) .... Take 3  tables by mouth once daily 8)  Aspir-low 81 Mg Tbec (Aspirin) .... Take 1 tablet by mouth once a day 9)  Lantus 100 Unit/ml Soln (Insulin glargine) .... Inject 38 units subcutaneously daily 10)  Onetouch Ultra Test Strp (Glucose blood) .... Use to test blood sugars three times a day 11)  Insulin Syringe 31g X 5/16" 0.3 Ml Misc (Insulin syringe-needle u-100) .... Use to inject lantus insulin once daily 12)  Loratadine 10 Mg Tabs (Loratadine) .... Take 1 tablet by mouth once a day 13)  Wellbutrin Xl 150 Mg Xr24h-tab (Bupropion hcl) .... Take 1 tablet once daily 14)  Colace 100 Mg Caps (Docusate sodium) .... Take 1 tablet by mouth two times a day 15)  Prinzide 10-12.5 Mg Tabs (Lisinopril-hydrochlorothiazide) .... Take 1 tablet by mouth once a day 16)  Th Flax Seed Oil 1000 Mg Caps (Flaxseed (linseed)) .... 2 tablespoons daily 17)  Norco 10-325 Mg Tabs (Hydrocodone-acetaminophen) .... Take 1 tablet by mouth four times a day as needed pain 18)  Novolog 100 Unit/ml Soln (Insulin aspart) .... Inject 3 units subcutaneously 15 minutesbefore lunch.  Patient Instructions: 1)  Please follow-up at the clinic in 1 month, at which time we will reevaluate your diabetes, and do a PAP.  2)  You have been started on Novolog insulin, you should inject this 15 minutes before lunch, and make sure you eat lunch after injecting the insulin. 3)  Please stop by the clinic in 1-2 weeks for a glucometer download, so we can see how your sugars have been doing. It wont be an official visit, but I will have the nurse tell you if you need to further escalate your  insulin dosage. 4)  If you have worsening of your symptoms, develop chest pain, difficulty breathing, shortness of breath beyond your baseline, please call the clinic. 5)  Please bring all of your medications in a bag to your next visit.  Prescriptions: NOVOLOG 100 UNIT/ML SOLN (INSULIN ASPART) inject 3 units subcutaneously 15 minutesbefore lunch.  #1 vial x 5   Entered and Authorized by:   [name] DO   Signed by:   [name] DO on 01/10/2010   Method used:   Printed then faxed to ...       [facility] (mail-order)       [address] [place], [place]  [address]       Ph: [phone]       Fax: [phone]   RxID:   [id number]    Orders Added: 1)  Est. Patient Level IV [15176]     Prevention & Chronic Care Immunizations   Influenza vaccine: 11/23/09  (11/23/2009)   Influenza vaccine deferral: Deferred  (01/10/2010)   Influenza vaccine due: 11/27/2006    Tetanus booster: 10/19/2006: Td   Td booster deferral: Deferred  (  01/10/2010)   Tetanus booster due: 10/18/2016    Pneumococcal vaccine: Not documented    H. zoster vaccine: Not documented   H. zoster vaccine deferral: Not indicated  (08/28/2009)  Colorectal Screening   Hemoccult: Not documented   Hemoccult action/deferral: Not indicated  (08/28/2009)    Colonoscopy: normal  (08/18/2005)   Colonoscopy action/deferral: Not indicated  (03/22/2009)   Colonoscopy due: 08/19/2015  Other Screening   Pap smear: NEGATIVE FOR INTRAEPITHELIAL LESIONS OR MALIGNANCY.  (12/01/2008)   Pap smear action/deferral: Deferred  (01/10/2010)   Pap smear due: 12/2011    Mammogram: Assessment: BIRADS 1.   (08/03/2007)   Mammogram action/deferral: Ordered  (01/10/2010)   Mammogram due: 08/02/2008    DXA bone density scan: Not documented   Smoking status: current  (01/10/2010)   Smoking cessation counseling: yes  (01/10/2010)  Diabetes Mellitus   HgbA1C: 7.3  (11/08/2009)    HgbA1C action/deferral: Ordered  (06/20/2009)   Hemoglobin A1C due: 02/07/2010    Eye exam: No diabetic retinopathy.     (09/09/2007)   Diabetic eye exam action/deferral: Deferred  (01/10/2010)   Eye exam due: 09/2008    Foot exam: yes  (09/14/2009)   Foot exam action/deferral: Do today   High risk foot: Not documented   Foot care education: Not documented   Foot exam due: 09/15/2010    Urine microalbumin/creatinine ratio: 13.9  (11/09/2009)   Urine microalbumin action/deferral: Ordered   Urine microalbumin/cr due: 11/10/2010    Diabetes flowsheet reviewed?: Yes   Progress toward A1C goal: Unchanged   Diabetes comments: Patient knows needs eye exam, cannot afford at this time.   Lipids   Total Cholesterol: 111  (03/22/2009)   Lipid panel action/deferral: Lipid Panel ordered   LDL: 45  (03/22/2009)   LDL Direct: Not documented   HDL: 34  (03/22/2009)   Triglycerides: 159  (03/22/2009)   Lipid panel due: 03/22/2010    SGOT (AST): 14  (08/28/2009)   SGPT (ALT): 12  (08/28/2009)   Alkaline phosphatase: 38  (08/28/2009)   Total bilirubin: 0.4  (08/28/2009)    Lipid flowsheet reviewed?: Yes   Progress toward LDL goal: Improved  Hypertension   Last Blood Pressure: 113 / 72  (01/10/2010)   Serum creatinine: 0.82  (10/02/2009)   Serum potassium 4.5  (10/02/2009)    Hypertension flowsheet reviewed?: Yes   Progress toward BP goal: Improved  Self-Management Support :   Personal Goals (by the next clinic visit) :     Personal A1C goal: 7  (03/22/2009)     Personal blood pressure goal: 130/80  (03/22/2009)     Personal LDL goal: 100  (03/22/2009)    Patient will work on the following items until the next clinic visit to reach self-care goals:     Medications and monitoring: take my medicines every day, bring all of my medications to every visit, examine my feet every day  (01/10/2010)     Eating: drink diet soda or water instead of juice or soda, eat more vegetables, use  fresh or frozen vegetables, eat foods that are low in salt, eat baked foods instead of fried foods, eat fruit for snacks and desserts, limit or avoid alcohol  (01/10/2010)     Activity: take a 30 minute walk every day, take the stairs instead of the elevator, park at the far end of the parking lot, join a walking program  (01/10/2010)    Diabetes self-management support: Resources for patients handout  (01/10/2010)   Last diabetes  self-management training by diabetes educator: 06/20/2009    Hypertension self-management support: Resources for patients handout  (01/10/2010)    Lipid self-management support: Resources for patients handout  (01/10/2010)        Resource handout printed.   Nursing Instructions: Diabetic foot exam today    Appended Document: Orders Update    Clinical Lists Changes  Problems: Added new problem of PREVENTIVE HEALTH CARE (ICD-V70.0) Orders: Added new Test order of Mammogram (Screening) (Mammo) - Signed      Appended Document: per drk-reynolds/cpap,gi,cbgs hi/pcp-magick/hla I discussed the patient with Dr. Guy Sandifer and I agree with the assessment and plan as outlined above.

## 2010-03-12 NOTE — Progress Notes (Signed)
Summary: PAGE 2  Phone Note Refill Request   Refills Requested: Medication #1:  SPIRIVA HANDIHALER 18 MCG CAPS Inhale contents of 1 capsule once a day   Dosage confirmed as above?Dosage Confirmed   Supply Requested: 1 year  Medication #2:  CRESTOR 20 MG  TABS Take 1 tablet by mouth once a day   Dosage confirmed as above?Dosage Confirmed   Supply Requested: 1 year  Medication #3:  ADVAIR DISKUS 250-50 MCG/DOSE MISC 1 puff twice daily   Dosage confirmed as above?Dosage Confirmed   Supply Requested: 1 year  Medication #4:  GLUCOPHAGE XR 500 MG XR24H-TAB 2 pills twice a day   Dosage confirmed as above?Dosage Confirmed   Supply Requested: 1 year Initial call taken by: Freddy Finner RN,  February 26, 2009 4:10 PM    Prescriptions: GLUCOPHAGE XR 500 MG XR24H-TAB (METFORMIN HCL) 2 pills twice a day  #120 x 11   Entered and Authorized by:   [name] MD   Signed by:   [name] MD on 02/26/2009   Method used:   Faxed to ...       [facility] (mail-order)       [address] [place], [place]  [address]       Ph: [phone]       Fax: [phone]   RxID:   [id number] ADVAIR DISKUS 250-50 MCG/DOSE MISC (FLUTICASONE-SALMETEROL) 1 puff twice daily  #1 diskus x 11   Entered and Authorized by:   [name] MD   Signed by:   [name] MD on 02/26/2009   Method used:   Faxed to ...       [facility] (mail-order)       [address] [place], [place]  [address]       Ph: [phone]       Fax: [phone]   RxID:   [id number] CRESTOR 20 MG  TABS (ROSUVASTATIN CALCIUM) Take 1 tablet by mouth once a day  #30 x 11   Entered and Authorized by:   [name] MD   Signed by:   [name] MD on 02/26/2009   Method used:   Faxed to ...       [facility] (mail-order)       [address] [place], [place]  [address]       Ph: [phone]       Fax: [phone]   RxID:    [id number] SPIRIVA HANDIHALER 18 MCG CAPS (TIOTROPIUM BROMIDE MONOHYDRATE) Inhale contents of 1 capsule once a day  #1 x 11   Entered and Authorized by:   [name] MD   Signed by:   [name] MD on 02/26/2009   Method used:   Faxed to ...       [facility] (mail-order)       [address] [place], [place]  [address]       Ph: [phone]       Fax: [phone]   RxID:   [id number]

## 2010-03-12 NOTE — Progress Notes (Signed)
Summary: multiple/ hla  Phone Note Outgoing Call   Summary of Call: called pt told her cymbalta approved and sleep study appt time, will do shoes mon Initial call taken by: Freddy Finner RN,  May 31, 2009 6:04 PM

## 2010-03-12 NOTE — Assessment & Plan Note (Signed)
Summary: please see dr joines/pcp/magick/hla   Vital Signs:  Patient profile:   60 year old female O2 Sat:      95 % on Room air Temp:     99.1 degrees F (37.28 degrees C) oral Pulse rate:   96 / minute Resp:     24 per minute BP sitting:   114 / 62  (left arm)  Vitals Entered By: Freddy Finner RN (September 03, 2009 10:23 AM)  O2 Flow:  Room air CC: weak, low hgb, short of breath, Depression Is Patient Diabetic? Yes Comments IV started with 23 G 1In IW to left forearm (pt refused left hand) - excellent blood return - dressing dry and intact - site WNL - Yvonna Alanis RN  September 03, 2009 12:06 PM    Primary Care Provider:  Trinidad Curet MD  CC:  weak, low hgb, short of breath, and Depression.  History of Present Illness: Patient presents for follow-up of anemia noted last week on labs.  She reports extreme fatiguability over the past few days, to the point that she has limiting symptoms when she walks from roorm to room at home.  She had right upper quadrant pain last week and has had some nausea but no emesis; she denies BRBPR or melena.  She called the on-call resident this weekend who advised possible transfusion in short stay.  Depression History:      The patient denies a depressed mood most of the day and a diminished interest in her usual daily activities.        Suicide risk questions reveal that she does not feel like life is worth living.  The patient denies that she wishes that she were dead and denies that she has thought about ending her life.         Preventive Screening-Counseling & Management  Alcohol-Tobacco     Smoking Status: current  Allergies: 1)  ! Ultram 2)  ! Morphine  Physical Exam  Lungs:  normal respiratory effort, normal breath sounds, no crackles, and no wheezes.   Heart:  normal rate, regular rhythm, no murmur, no gallop, and no rub.   Abdomen:  soft, non-tender, and normal bowel sounds.     Impression & Recommendations:  Problem # 1:  ANEMIA  (AVW-098.9) The etiology of patient's anemia is not clear; her hemoglobin is down from 14 in March of 2010, and down from 9.5 on March 27, 2009.  Given her symptoms of dyspnea and marked fatiguability in the setting of COPD and hemoglobin less than 8, transfusion is reasonable.  Her low ferritin supports a diagnosis of iron-deficiency anemia.  She had a prior GI work-up about 3 years ago by [name], and a repeat evaluation is warranted.  Plan is to admit for transfusion, monitoring, and GI evaluation.    Her updated medication list for this problem includes:    Ferrous Sulfate 325 (65 Fe) Mg Tabs (Ferrous sulfate) .Marland Kitchen... Take 1 tablet by mouth two times a day for low blood iron  Orders: T-CBC with Diff ([place]) (85027-CBCD) T-LDH ([place]) (83615-LDH) T- * Misc. Laboratory test [phone])  Hgb: 8.1 (08/28/2009)   Hct: 25.1 (08/28/2009)   Platelets: 240 (08/28/2009) RBC: 3.25 (08/28/2009)   RDW: 18.6 (08/28/2009)   WBC: 9.1 (08/28/2009) MCV: 77.4 (08/28/2009)   MCHC: 32.3 (08/28/2009) B12: 330 (04/18/2008)   TSH: 0.997 (04/18/2008)  Problem # 2:  RUQ PAIN (ICD-789.01) Etiology unclear; pain is intermittent.  As above, GI workup warranted.  Orders:  T-CMP [facility]) (80053-CMP) T-Lipase ([place]) (83690-LIPASE)  Complete Medication List: 1)  Prilosec 40 Mg Cpdr (Omeprazole) .... Take 1 tablet by mouth once a day 2)  Alprazolam 0.5 Mg Tabs (Alprazolam) .... One pill daily as needed for anxiety 3)  Albuterol 90 Mcg/act Aers (Albuterol) .... Two puffs every four hours as needed for shortness of breath 4)  Spiriva Handihaler 18 Mcg Caps (Tiotropium bromide monohydrate) .... Inhale contents of 1 capsule once a day 5)  Crestor 20 Mg Tabs (Rosuvastatin calcium) .... Take 1 tablet by mouth once a day 6)  Advair Diskus 250-50 Mcg/dose Misc (Fluticasone-salmeterol) .Marland Kitchen.. 1 puff twice daily 7)  Glucophage Xr 500 Mg Xr24h-tab (Metformin hcl) .... 2 pills twice a day 8)  Cymbalta 30 Mg Cpep  (Duloxetine hcl) .... Take 3  tables by mouth once daily 9)  Aspir-low 81 Mg Tbec (Aspirin) .... Take 1 tablet by mouth once a day 10)  Glipizide Xl 10 Mg Tb24 (Glipizide) .... Take 1 tablet by mouth twice a day 11)  Hydrocodone-acetaminophen 7.5-325 Mg Tabs (Hydrocodone-acetaminophen) .... Take 1 tablet every 4-6 hours by mouth as needed for the pain. 12)  Lantus 100 Unit/ml Soln (Insulin glargine) .... Inject 34 units subcutaneously daily and increase until fasting blood sugars are mostly less than 130 mg/dl 13)  Onetouch Ultra Test Strp (Glucose blood) .... Use to test blood sugars three times a day 14)  Insulin Syringe 31g X 5/16" 0.3 Ml Misc (Insulin syringe-needle u-100) .... Use to inject lantus insulin once daily 15)  Lisinopril 5 Mg Tabs (Lisinopril) .... Take 1 tablet by mouth once a day 16)  Nystatin 100000 Unit/gm Crea (Nystatin) .... Apply to the affected area 3-4 times daily as needed 17)  Loratadine 10 Mg Tabs (Loratadine) .... Take 1 tablet by mouth once a day 18)  Wellbutrin Xl 150 Mg Xr24h-tab (Bupropion hcl) .... Take 1 tablet once daily 19)  Promethazine Hcl 12.5 Mg Tabs (Promethazine hcl) .... Take 1 pill by mouth three times a day as needed for nausea and vomiting. 20)  Ferrous Sulfate 325 (65 Fe) Mg Tabs (Ferrous sulfate) .... Take 1 tablet by mouth two times a day for low blood iron  Process Orders Check Orders Results:     [facility]: Order checked:     [phone] -- T- * Misc. Laboratory test -- No CPT codes found (CPT: ) Tests Sent for requisitioning (September 03, 2009 1:09 PM):     09/03/2009: [facility] -- T- * Misc. Laboratory test [phone] (signed)    Prevention & Chronic Care Immunizations   Influenza vaccine: Historical  (10/23/2008)   Influenza vaccine deferral: Not indicated  (06/20/2009)   Influenza vaccine due: 11/27/2006    Tetanus booster: 10/19/2006: Td   Td booster deferral: Not indicated  (03/22/2009)   Tetanus booster  due: 10/18/2016    Pneumococcal vaccine: Not documented    H. zoster vaccine: Not documented   H. zoster vaccine deferral: Not indicated  (08/28/2009)  Colorectal Screening   Hemoccult: Not documented   Hemoccult action/deferral: Not indicated  (08/28/2009)    Colonoscopy: normal  (08/18/2005)   Colonoscopy action/deferral: Not indicated  (03/22/2009)   Colonoscopy due: 08/19/2015  Other Screening   Pap smear: NEGATIVE FOR INTRAEPITHELIAL LESIONS OR MALIGNANCY.  (12/01/2008)   Pap smear action/deferral: GYN Referral  (11/15/2008)   Pap smear due: 12/2011    Mammogram: Assessment: BIRADS 1.   (08/03/2007)   Mammogram action/deferral: Not indicated  (03/22/2009)  Mammogram due: 08/02/2008    DXA bone density scan: Not documented   Smoking status: current  (09/03/2009)   Smoking cessation counseling: yes  (08/29/2009)  Diabetes Mellitus   HgbA1C: 6.0  (06/20/2009)   HgbA1C action/deferral: Ordered  (06/20/2009)   Hemoglobin A1C due: 08/26/2006    Eye exam: No diabetic retinopathy.     (09/09/2007)   Diabetic eye exam action/deferral: Refused  (06/20/2009)   Eye exam due: 09/2008    Foot exam: yes  (11/30/2008)   High risk foot: Not documented   Foot care education: Not documented    Urine microalbumin/creatinine ratio: 39.3  (11/15/2008)   Urine microalbumin action/deferral: Ordered   Urine microalbumin/cr due: 08/26/2006  Lipids   Total Cholesterol: 111  (03/22/2009)   Lipid panel action/deferral: Lipid Panel ordered   LDL: 45  (03/22/2009)   LDL Direct: Not documented   HDL: 34  (03/22/2009)   Triglycerides: 159  (03/22/2009)   Lipid panel due: 11/27/2006    SGOT (AST): 14  (08/28/2009)   SGPT (ALT): 12  (08/28/2009)   Alkaline phosphatase: 38  (08/28/2009)   Total bilirubin: 0.4  (08/28/2009)  Hypertension   Last Blood Pressure: 114 / 62  (09/03/2009)   Serum creatinine: 0.81  (08/28/2009)   Serum potassium 4.1  (08/28/2009)  Self-Management  Support :   Personal Goals (by the next clinic visit) :     Personal A1C goal: 7  (03/22/2009)     Personal blood pressure goal: 130/80  (03/22/2009)     Personal LDL goal: 100  (03/22/2009)    Diabetes self-management support: Written self-care plan, Education handout, Resources for patients handout  (08/28/2009)   Last diabetes self-management training by diabetes educator: 06/20/2009    Hypertension self-management support: Written self-care plan, Education handout, Resources for patients handout  (08/28/2009)    Lipid self-management support: Written self-care plan, Education handout, Resources for patients handout  (08/28/2009)

## 2010-03-14 NOTE — Consult Note (Signed)
Summary: [facility]   Imported By: Garlan Fillers 02/15/2010 11:08:57  _____________________________________________________________________  External Attachment:    Type:   Image     Comment:   External Document

## 2010-03-14 NOTE — Consult Note (Signed)
Summary: [facility]   Imported By: Garlan Fillers 02/18/2010 10:38:17  _____________________________________________________________________  External Attachment:    Type:   Image     Comment:   External Document

## 2010-03-14 NOTE — Letter (Signed)
Summary: [facility]   Imported By: Garlan Fillers 02/18/2010 11:51:52  _____________________________________________________________________  External Attachment:    Type:   Image     Comment:   External Document

## 2010-03-14 NOTE — Letter (Signed)
Summary: [facility]   Imported By: Garlan Fillers 03/01/2010 10:19:44  _____________________________________________________________________  External Attachment:    Type:   Image     Comment:   External Document

## 2010-03-14 NOTE — Progress Notes (Signed)
Summary: REfill/gh  Phone Note Refill Request Message from:  Fax from Pharmacy on February 05, 2010 10:58 AM  Refills Requested: Medication #1:  NORCO 10-325 MG TABS Take 1 tablet by mouth four times a day as needed pain  Medication #2:  Lancets Safty seal na ea Use to test blood 3 times daily #120  Medication #3:  LANTUS 100 UNIT/ML SOLN Inject 38 units subcutaneously daily  Medication #4:  ONETOUCH ULTRA TEST  STRP Use to test blood sugars three times a day  Method Requested: Fax to Local Pharmacy Initial call taken by: Sander Nephew RN,  February 05, 2010 11:02 AM    Prescriptions: NORCO 10-325 MG TABS (HYDROCODONE-ACETAMINOPHEN) Take 1 tablet by mouth four times a day as needed pain  #120 x 0   Entered and Authorized by:   Trinidad Curet MD   Signed by:   Trinidad Curet MD on 02/05/2010   Method used:   Print then Give to Patient   RxID:   [id number] NOVOLOG 100 UNIT/ML SOLN (INSULIN ASPART) inject 3 units subcutaneously 15 minutesbefore lunch.  #1 vial x 6   Entered and Authorized by:   Trinidad Curet MD   Signed by:   Trinidad Curet MD on 02/05/2010   Method used:   Faxed to ...       [facility] (mail-order)       [address] [place], [place]  [address]       Ph: [phone]       Fax: [phone]   [place]:   [phone]

## 2010-03-14 NOTE — Consult Note (Signed)
Summary: PATHOLOGY  PATHOLOGY   Imported By: Garlan Fillers 03/05/2010 10:36:38  _____________________________________________________________________  External Attachment:    Type:   Image     Comment:   External Document

## 2010-03-14 NOTE — Progress Notes (Signed)
Summary: REfill/gh  Phone Note Refill Request Message from:  Fax from Pharmacy on February 05, 2010 10:37 AM  Refills Requested: Medication #1:  ADVAIR DISKUS 250-50 MCG/DOSE MISC 1 puff twice daily  Medication #2:  INSULIN SYRINGE 31G X 5/16" 0.3 ML MISC use to inject lantus insulin once daily  Medication #3:  CRESTOR 20 MG  TABS Take 1 tablet by mouth once a day  Medication #4:  CYMBALTA 30 MG CPEP Take 3  tables by mouth once daily Pharmacy would like 6 month refills.   Method Requested: Fax to [facility] Initial call taken by: Sander Nephew RN,  February 05, 2010 10:56 AM  Follow-up for Phone Call        completed refill, thank you Iskra  Follow-up by: Trinidad Curet MD,  February 05, 2010 11:03 AM    Prescriptions: INSULIN SYRINGE 31G X 5/16" 0.3 ML MISC (INSULIN SYRINGE-NEEDLE U-100) use to inject lantus insulin once daily  #100 x 6   Entered and Authorized by:   Trinidad Curet MD   Signed by:   Trinidad Curet MD on 02/05/2010   Method used:   Faxed to ...       [facility] (mail-order)       [address] [place], [place]  [address]       Ph: [phone]       Fax: [phone]   RxID:   [id number] Donald Siva TEST  STRP (GLUCOSE BLOOD) Use to test blood sugars three times a day  #90 x 6   Entered and Authorized by:   Trinidad Curet MD   Signed by:   Trinidad Curet MD on 02/05/2010   Method used:   Faxed to ...       [facility] (mail-order)       [address] [place], [place]  [address]       Ph: [phone]       Fax: [phone]   RxID:   [id number] LANTUS 100 UNIT/ML SOLN (INSULIN GLARGINE) Inject 38 units subcutaneously daily  #1 x 6   Entered and Authorized by:   Trinidad Curet MD   Signed by:   Trinidad Curet MD on 02/05/2010   Method used:   Faxed to ...       [facility] (mail-order)       [address] [place], [place]  [address]       Ph: [phone]       Fax: [phone]  RxID:   [id number] CYMBALTA 30 MG CPEP (DULOXETINE HCL) Take 3  tables by mouth once daily  #90 x 6   Entered and Authorized by:   Trinidad Curet MD   Signed by:   Trinidad Curet MD on 02/05/2010   Method used:   Faxed to ...       [facility] (mail-order)       [address] [place], [place]  [address]       Ph: [phone]       Fax: [phone]   RxID:   [id number] ADVAIR DISKUS 250-50 MCG/DOSE MISC (FLUTICASONE-SALMETEROL) 1 puff twice daily  #1 diskus x 6   Entered and Authorized by:   Trinidad Curet MD   Signed by:   Trinidad Curet MD on 02/05/2010   Method used:   Vir[facility] Gess  to ...       [facility] (mail-order)       [address] [place], [place]  [address]       Ph: [phone]       Fax: [phone]   RxID:   [id number] CRESTOR 20 MG  TABS (ROSUVASTATIN CALCIUM) Take 1 tablet by mouth once a day  #30 x 6   Entered and Authorized by:   Trinidad Curet MD   Signed by:   Trinidad Curet MD on 02/05/2010   Method used:   Faxed to ...       [facility] (mail-order)       [address] [place], [place]  [address]       Ph: [phone]       Fax: [phone]   RxID:   [id number] SPIRIVA HANDIHALER 18 MCG CAPS (TIOTROPIUM BROMIDE MONOHYDRATE) Inhale contents of 1 capsule once a day  #1 x 6   Entered and Authorized by:   Trinidad Curet MD   Signed by:   Trinidad Curet MD on 02/05/2010   Method used:   Faxed to ...       [facility] (mail-order)       [address] [place], [place]  [address]       Ph: [phone]       Fax: [phone]   RxID:   [id number] ALBUTEROL 90 MCG/ACT AERS (ALBUTEROL) Two puffs every four hours as needed for shortness of breath  #1 x 6   Entered and Authorized by:   Trinidad Curet MD   Signed by:   Trinidad Curet MD on 02/05/2010   Method used:   Faxed to ...       [facility] (mail-order)       [address] [place], [place]   [address]       Ph: [phone]       Fax: [phone]   [place]:   [phone]

## 2010-03-14 NOTE — Assessment & Plan Note (Signed)
Summary: ACUTE/MAGICK/1 MONTH RECHECK FOR DM/CH   Vital Signs:  Patient profile:   60 year old female Height:      64 inches (162.56 cm) Weight:      178.4 pounds (79.36 kg) BMI:     30.08 Temp:     97.0 degrees F (36.11 degrees C) oral Pulse rate:   90 / minute BP sitting:   127 / 67  (right arm) Cuff size:   regular  Vitals Entered By: Lucky Rathke NT II (February 14, 2010 1:36 PM) CC: PAIN ALL OVER # 6  /  DM /   METER Is Patient Diabetic? Yes Did you bring your meter with you today? Yes Pain Assessment Patient in pain? yes     Location: ALL OVER Intensity:      6 Type: ACHE Onset of pain  Chronic Nutritional Status BMI of > 30 = obese  Have you ever been in a relationship where you felt threatened, hurt or afraid?No   Does patient need assistance? Functional Status Self care Ambulation Normal   Primary Care Provider:  Trinidad Curet MD  CC:  PAIN ALL OVER # 6  /  DM /   METER.  History of Present Illness: Pt is a 60 yo F with PMHx of DMII, fibromyalgia, COPD, who presents to clinic today with for 1 mo f/u of her DM and for routine pelvic exam  1) DMII - At her last appt, Lantus wasincreased lantus to 38 units and pt was started on mealtime insulin.  She was not able to get her novolog until 1 week ago; there were some difficulties obtained the meds from her delivery pharmacy service ([facility]).  She has been using the mealtime coverage for the past 3 days.  She reports using 40u of Lantus at bedtime instead of 38 units.   No hypoglycemia. Reports blood sugars ranging in the 200s preprandial in breakfast, prepprandial to lunch 169-280 mg/dL, post-prandial to dinner 243-314. + Polyuria, polydipsia, no blurry vision, no nausea, no vomiting.   2) Pelvic exam: pt denies abnormal vaginal discharge, vaginal bleeding, pain, itching, burning, abnormal lesions, or other concern.     Depression History:      The patient denies a depressed mood most of the day and  a diminished interest in her usual daily activities.         Preventive Screening-Counseling & Management  Alcohol-Tobacco     Alcohol drinks/day: 0     Smoking Status: current     Smoking Cessation Counseling: yes     Packs/Day: 1.5     Year Started: AT THE AGE OF 12  Caffeine-Diet-Exercise     Does Patient Exercise: yes     Type of exercise: WALKING  Allergies: 1)  ! Ultram 2)  ! Morphine  Physical Exam  General:  Vital signs reviewed and noted. Well-developed,well-nourished,in no acute distress; alert,appropriate and cooperative throughout examination. Head: normocephalic, atraumatic. Neck: No deformities, masses, or tenderness noted. Lungs: Normal respiratory effort. Clear to auscultation BL without crackles or wheezes.  Heart: RRR. S1 and S2 normal without gallop, murmur, or rubs.  Abdomen: BS normoactive. Soft, Nondistended, non-tender.  No masses or organomegaly. Extremities: No pretibial edema.   Head:  atraumatic, no abnormalities observed, and no abnormalities palpated.   Eyes:  vision grossly intact, pupils equal, pupils round, and pupils reactive to light.   Mouth:  no dental plaque, pharynx pink and moist, no postnasal drip, no pharyngeal crowing, and poor dentition.  Lungs:  normal respiratory effort, normal breath sounds, no crackles, and no wheezes.   Heart:  normal rate, regular rhythm, no murmur, no gallop, and no rub.   Abdomen:  soft, non-tender, and normal bowel sounds.   Msk:  No deformity or scoliosis noted of thoracic or lumbar spine.   Extremities:  No clubbing, cyanosis, or deformity noted with normal full range of motion of all joints.  Trace edema. Neurologic:  No cranial nerve deficits noted. Station and gait are normal.Sensory, motor and coordinative functions appear intact. Skin:  turgor normal, color normal, and no rashes.   Psych:  Oriented X3, memory intact for recent and remote, normally interactive, and good eye contact.     Impression &  Recommendations:  Problem # 1:  AODM (ICD-250.00)  Will check Aic toda and continue current regimen.  WIll bring pt back in 1 month to review CBGs as adjust insulin regimen if indicated.  Her updated medication list for this problem includes:    Aspir-low 81 Mg Tbec (Aspirin) .Marland Kitchen... Take 1 tablet by mouth once a day    Lantus 100 Unit/ml Soln (Insulin glargine) ..... Inject 40 units subcutaneously daily    Prinzide 10-12.5 Mg Tabs (Lisinopril-hydrochlorothiazide) .Marland Kitchen... Take 1 tablet by mouth once a day    Novolog 100 Unit/ml Soln (Insulin aspart) ..... Inject 3 units subcutaneously 15 minutesbefore lunch.  Labs Reviewed: Creat: 0.82 (10/02/2009)     Last Eye Exam: No diabetic retinopathy.    (09/09/2007) Reviewed HgBA1c results: 7.3 (11/08/2009)  5.6 (09/14/2009)  Orders: T- Capillary Blood Glucose (82948) T-Hgb A1C (in-house) ([id number])  Problem # 2:  SCREENING FOR MALIGNANT NEOPLASM OF THE CERVIX (ICD-V76.2)  Annual pelvic exam performed today with pap smear.  Orders: T-PAP [facility]) [phone])  Complete Medication List: 1)  Prilosec 40 Mg Cpdr (Omeprazole) .... Take 2 tablets by mouth once a day 2)  Alprazolam 0.5 Mg Tabs (Alprazolam) .... One pill daily as needed for anxiety 3)  Albuterol 90 Mcg/act Aers (Albuterol) .... Two puffs every four hours as needed for shortness of breath 4)  Spiriva Handihaler 18 Mcg Caps (Tiotropium bromide monohydrate) .... Inhale contents of 1 capsule once a day 5)  Crestor 20 Mg Tabs (Rosuvastatin calcium) .... Take 1 tablet by mouth once a day 6)  Advair Diskus 250-50 Mcg/dose Misc (Fluticasone-salmeterol) .Marland Kitchen.. 1 puff twice daily 7)  Cymbalta 30 Mg Cpep (Duloxetine hcl) .... Take 3  tables by mouth once daily 8)  Aspir-low 81 Mg Tbec (Aspirin) .... Take 1 tablet by mouth once a day 9)  Lantus 100 Unit/ml Soln (Insulin glargine) .... Inject 40 units subcutaneously daily 10)  Onetouch Ultra Test Strp (Glucose blood) .... Use to test blood sugars  three times a day 11)  Insulin Syringe 31g X 5/16" 0.3 Ml Misc (Insulin syringe-needle u-100) .... Use to inject lantus insulin once daily 12)  Loratadine 10 Mg Tabs (Loratadine) .... Take 1 tablet by mouth once a day 13)  Wellbutrin Xl 150 Mg Xr24h-tab (Bupropion hcl) .... Take 1 tablet once daily 14)  Colace 100 Mg Caps (Docusate sodium) .... Take 1 tablet by mouth two times a day 15)  Prinzide 10-12.5 Mg Tabs (Lisinopril-hydrochlorothiazide) .... Take 1 tablet by mouth once a day 16)  Th Flax Seed Oil 1000 Mg Caps (Flaxseed (linseed)) .... 2 tablespoons daily 17)  Norco 10-325 Mg Tabs (Hydrocodone-acetaminophen) .... Take 1 tablet by mouth four times a day as needed pain 18)  Novolog 100 Unit/ml Soln (Insulin aspart) .Marland KitchenMarland KitchenMarland Kitchen  Inject 3 units subcutaneously 15 minutesbefore lunch.  Patient Instructions: 1)   Please follow-up at the clinic in 1 month with Dr. Jerelene Redden (end of January is ok), at which time we will review you blood sugars and re-evaluate your insulin regimen. 2)  Use you Lantus and Novolog as directed. 3)  Please stop by the clinic in 1-2 weeks for a glucometer download, so we can see how your sugars have been doing. It wont be an official visit, but I will have the nurse tell you if you need to further escalate your insulin dosage. 4)   If you have worsening of your symptoms, develop chest pain, difficulty breathing, shortness of breath beyond your baseline, please call the clinic. 5)  Please bring all of your medications in a bag and your gulcose meter with you  to your next visit.  6)  Start taking Loratadine every day to help with your cough and congestion. Prescriptions: LORATADINE 10 MG TABS (LORATADINE) Take 1 tablet by mouth once a day  #30 x 6   Entered and Authorized by:   Eduardo Osier DO   Signed by:   Eduardo Osier DO on 02/14/2010   Method used:   Faxed to ...       [facility] (mail-order)       [address] [place], [place]  [address]       Ph:  [phone]       Fax: [phone]   RxID:   [id number]    Orders Added: 1)  T- Capillary Blood Glucose [82948] 2)  T-Hgb A1C (in-house) [83036QW] 3)  T-PAP [facility]) [88142] 4)  Est. Patient Level IV [93790]     Prevention & Chronic Care Immunizations   Influenza vaccine: 11/23/09  (11/23/2009)   Influenza vaccine deferral: Deferred  (01/10/2010)   Influenza vaccine due: 11/27/2006    Tetanus booster: 10/19/2006: Td   Td booster deferral: Deferred  (01/10/2010)   Tetanus booster due: 10/18/2016    Pneumococcal vaccine: Not documented    H. zoster vaccine: Not documented   H. zoster vaccine deferral: Not indicated  (08/28/2009)  Colorectal Screening   Hemoccult: Not documented   Hemoccult action/deferral: Not indicated  (08/28/2009)    Colonoscopy: normal  (08/18/2005)   Colonoscopy action/deferral: Not indicated  (03/22/2009)   Colonoscopy due: 08/19/2015  Other Screening   Pap smear: NEGATIVE FOR INTRAEPITHELIAL LESIONS OR MALIGNANCY.  (12/01/2008)   Pap smear action/deferral: Deferred  (01/10/2010)   Pap smear due: 12/2011    Mammogram: Assessment: BIRADS 1.   (08/03/2007)   Mammogram action/deferral: Ordered  (01/10/2010)   Mammogram due: 08/02/2008    DXA bone density scan: Not documented   Smoking status: current  (02/14/2010)   Smoking cessation counseling: yes  (02/14/2010)  Diabetes Mellitus   HgbA1C: 7.3  (11/08/2009)   HgbA1C action/deferral: Ordered  (06/20/2009)   Hemoglobin A1C due: 02/07/2010    Eye exam: No diabetic retinopathy.     (09/09/2007)   Diabetic eye exam action/deferral: Deferred  (01/10/2010)   Eye exam due: 09/2008    Foot exam: yes  (09/14/2009)   Foot exam action/deferral: Do today   High risk foot: Not documented   Foot care education: Not documented   Foot exam due: 09/15/2010    Urine microalbumin/creatinine ratio: 13.9  (11/09/2009)   Urine microalbumin action/deferral: Ordered   Urine microalbumin/cr due:  11/10/2010  Lipids   Total Cholesterol: 111  (03/22/2009)  Lipid panel action/deferral: Lipid Panel ordered   LDL: 45  (03/22/2009)   LDL Direct: Not documented   HDL: 34  (03/22/2009)   Triglycerides: 159  (03/22/2009)   Lipid panel due: 03/22/2010    SGOT (AST): 14  (08/28/2009)   SGPT (ALT): 12  (08/28/2009)   Alkaline phosphatase: 38  (08/28/2009)   Total bilirubin: 0.4  (08/28/2009)  Hypertension   Last Blood Pressure: 127 / 67  (02/14/2010)   Serum creatinine: 0.82  (10/02/2009)   Serum potassium 4.5  (10/02/2009)  Self-Management Support :   Personal Goals (by the next clinic visit) :     Personal A1C goal: 7  (03/22/2009)     Personal blood pressure goal: 130/80  (03/22/2009)     Personal LDL goal: 100  (03/22/2009)    Patient will work on the following items until the next clinic visit to reach self-care goals:     Medications and monitoring: take my medicines every day, check my blood sugar, bring all of my medications to every visit, examine my feet every day  (02/14/2010)     Eating: drink diet soda or water instead of juice or soda, eat more vegetables, use fresh or frozen vegetables, eat foods that are low in salt, eat baked foods instead of fried foods, eat fruit for snacks and desserts, limit or avoid alcohol  (02/14/2010)     Activity: take a 30 minute walk every day, take the stairs instead of the elevator, park at the far end of the parking lot  (02/14/2010)    Diabetes self-management support: Resources for patients handout  (02/14/2010)   Last diabetes self-management training by diabetes educator: 06/20/2009    Hypertension self-management support: Resources for patients handout  (02/14/2010)    Lipid self-management support: Resources for patients handout  (02/14/2010)        Resource handout printed.   Appended Document: Lab Order Results of CBG and HGB A1C    Lab Visit  Laboratory Results   Blood Tests   Date/Time Received: February 14, 2010 2:41 PM  Date/Time Reported: Lenoria Farrier  February 14, 2010 2:41 PM   HGBA1C: 8.0%   (Normal Range: Non-Diabetic - 3-6%   Control Diabetic - 6-8%) CBG Random:: 230m/dL    Orders Today:

## 2010-03-14 NOTE — Progress Notes (Addendum)
Summary: refill/ hla  Phone Note Refill Request Message from:  Fax from Pharmacy on March 01, 2010 5:23 PM  Refills Requested: Medication #1:  ALPRAZOLAM 0.5 MG  TABS one pill daily as needed for anxiety   Dosage confirmed as above?Dosage Confirmed Initial call taken by: Freddy Finner RN,  March 01, 2010 5:24 PM  Follow-up for Phone Call        Refill approved-nurse to complete    Prescriptions: ALPRAZOLAM 0.5 MG  TABS (ALPRAZOLAM) one pill daily as needed for anxiety  #30 x 5   Entered and Authorized by:   Trinidad Curet MD   Signed by:   Trinidad Curet MD on 03/01/2010   Method used:   Historical   RxID:   [id number]

## 2010-03-15 NOTE — Miscellaneous (Signed)
Summary: HIPAA Restrictions  HIPAA Restrictions   Imported By: Bonner Puna 12/08/2007 11:58:36  _____________________________________________________________________  External Attachment:    Type:   Image     Comment:   External Document

## 2010-03-15 NOTE — Letter (Signed)
Summary: [employer]  BIO-TECH   Imported By: Garlan Fillers 10/11/2009 14:16:21  _____________________________________________________________________  External Attachment:    Type:   Image     Comment:   External Document

## 2010-03-15 NOTE — Medication Information (Signed)
Summary: [employer]   Imported By: Bonner Puna 11/02/2006 15:43:49  _____________________________________________________________________  External Attachment:    Type:   Image     Comment:   External Document

## 2010-03-15 NOTE — Procedures (Signed)
[facility] Dr[facility] St[facility]: [name]   Imported By: Bonner Puna 06/09/2007 15:19:00  _____________________________________________________________________  External Attachment:    Type:   Image     Comment:   External Document

## 2010-03-20 NOTE — Assessment & Plan Note (Signed)
Summary: ACUTE/MAGICK/F/U VISIT/CH   Vital Signs:  Patient profile:   60 year old female Height:      64 inches (162.56 cm) Weight:      177.6 pounds (81.09 kg) BMI:     30.73 Temp:     99.7 degrees F (37.61 degrees C) oral Pulse rate:   79 / minute BP sitting:   120 / 61  (left arm) Cuff size:   regular  Vitals Entered By: Lucky Rathke NT II (March 08, 2010 1:41 PM) CC: FOLLOW UP VISIT /  DM  FOLLOW UP ON STARTING NOVALOG, Depression Is Patient Diabetic? Yes Did you bring your meter with you today? Yes Pain Assessment Patient in pain? yes     Location: ALL OVER Intensity:         6 Onset of pain  Chronic Nutritional Status BMI of > 30 = obese  Have you ever been in a relationship where you felt threatened, hurt or afraid?No   Does patient need assistance? Functional Status Self care Ambulation Normal   Primary Care Provider:  Trinidad Curet MD  CC:  FOLLOW UP VISIT /  DM  FOLLOW UP ON STARTING NOVALOG and Depression.  History of Present Illness: Pt is a 60 yo F with PMHx of DMII, fibromyalgia, COPD, who presents to clinic today with for 2 week f/u of her DM   1) DMII -  She has been using her Lanuts every day, taking 40u every night.   She has not been using her novolog every day.  She does not notice any change with her CBGs after using her am novolog and is averaging in the 200s pre-post prandial and in the upper 100s fasting.   No hypoglycemia.Marland Kitchen + Polyuria, polydipsia, no blurry vision, no nausea, no vomiting.   SHe reports seom symptoms of suprapubic tenderness that began about 1 week ago that lasted for 3 days. She denies any increased frequency but reports some increased urgency.  She is very unhappy with her meter and would like a new one.     Depression History:      The patient denies a depressed mood most of the day and a diminished interest in her usual daily activities.         Preventive Screening-Counseling & Management  Alcohol-Tobacco  Alcohol drinks/day: 0     Smoking Status: current     Smoking Cessation Counseling: yes     Packs/Day: 1.5     Year Started: AT THE AGE OF 12  Caffeine-Diet-Exercise     Does Patient Exercise: yes     Type of exercise: WALKING  Current Medications (verified): 1)  Prilosec 40 Mg Cpdr (Omeprazole) .... Take 2 Tablets By Mouth Once A Day 2)  Alprazolam 1 Mg Tabs (Alprazolam) .... Take 1 Tablet By Mouth Two Times A Day As Needed For Anxiety and Sleep 3)  Albuterol 90 Mcg/act Aers (Albuterol) .... Two Puffs Every Four Hours As Needed For Shortness of Breath 4)  Spiriva Handihaler 18 Mcg Caps (Tiotropium Bromide Monohydrate) .... Inhale Contents of 1 Capsule Once A Day 5)  Crestor 20 Mg  Tabs (Rosuvastatin Calcium) .... Take 1 Tablet By Mouth Once A Day 6)  Advair Diskus 250-50 Mcg/dose Misc (Fluticasone-Salmeterol) .Marland Kitchen.. 1 Puff Twice Daily 7)  Cymbalta 30 Mg Cpep (Duloxetine Hcl) .... Take 3  Tables By Mouth Once Daily 8)  Aspir-Low 81 Mg Tbec (Aspirin) .... Take 1 Tablet By Mouth Once A Day 9)  Lantus 100 Unit/ml Soln (  Insulin Glargine) .... Inject 42 Units Subcutaneously Daily 10)  Onetouch Ultra Test  Strp (Glucose Blood) .... Use To Test Blood Sugars Three Times A Day 11)  Insulin Syringe 31g X 5/16" 0.3 Ml Misc (Insulin Syringe-Needle U-100) .... Use To Inject Lantus Insulin Once Daily 12)  Loratadine 10 Mg Tabs (Loratadine) .... Take 1 Tablet By Mouth Once A Day 13)  Wellbutrin Xl 150 Mg Xr24h-Tab (Bupropion Hcl) .... Take 1 Tablet Once Daily 14)  Colace 100 Mg Caps (Docusate Sodium) .... Take 1 Tablet By Mouth Two Times A Day 15)  Prinzide 10-12.5 Mg Tabs (Lisinopril-Hydrochlorothiazide) .... Take 1 Tablet By Mouth Once A Day 16)  Th Flax Seed Oil 1000 Mg Caps (Flaxseed (Linseed)) .... 2 Tablespoons Daily 17)  Norco 10-325 Mg Tabs (Hydrocodone-Acetaminophen) .... Take 1 Tablet By Mouth Four Times A Day As Needed Pain 18)  Novolog 100 Unit/ml Soln (Insulin Aspart) .... Inject 3 Units  Subcutaneously 15 Minutesbefore Lunch.  Allergies (verified): 1)  ! Ultram 2)  ! Morphine  Past History:  Past medical, surgical, family and social histories (including risk factors) reviewed for relevance to current acute and chronic problems.  Past Medical History: Reviewed history from 09/05/2009 and no changes required. PULMONARY HYPERTENSION  HYPERTENSION MITRAL REGURGITATION RUQ PAIN  MICROALBUMINURIA DYSLIPIDEMIA  HYPERTRIGLYCERIDEMIA DENTAL PAIN SHINGLES, HX OF ANEMIA  ARTHROPATHY NEC, OTHER SPEC SITE  AODM COLONIC POLYPS OSTEOPOROSIS  COPD  OBSTRUCTIVE SLEEP APNEA  DEPRESSION  FIBROMYALGIA  IRRITABLE BOWEL SYNDROME  GERD  Diabetes mellitus, type I Anxiety Low back pain Chronic diffuse pain involving the back, arms, and legs  Past Surgical History: Reviewed history from 09/05/2009 and no changes required.  orthodontic procedure   Family History: Reviewed history from 11/30/2008 and no changes required. No significant family medical history  Social History: Reviewed history from 12/07/2007 and no changes required. Lives alone.  No EtOH.  No illegal drugs.  Poor relationship with children.    Physical Exam  General:  Vital signs reviewed and noted. Well-developed,well-nourished,in no acute distress; alert,appropriate and cooperative throughout examination. Head: normocephalic, atraumatic. Neck: No deformities, masses, or tenderness noted. Lungs: Normal respiratory effort. Clear to auscultation BL without crackles or wheezes.  Heart: RRR. S1 and S2 normal without gallop, murmur, or rubs.  Abdomen: BS normoactive. Soft, Nondistended, non-tender.  No masses or organomegaly. Extremities: No pretibial edema.   Head:  atraumatic, no abnormalities observed, and no abnormalities palpated.   Eyes:  vision grossly intact, pupils equal, pupils round, and pupils reactive to light.   Mouth:  no dental plaque, pharynx pink and moist, no postnasal drip, no  pharyngeal crowing, and poor dentition.   Neck:  supple.   Lungs:  normal respiratory effort, normal breath sounds, no crackles, and no wheezes.   Heart:  normal rate, regular rhythm, no murmur, no gallop, and no rub.   Abdomen:  soft, non-tender, and normal bowel sounds.   Msk:  normal ROM, no joint tenderness, no joint swelling, no joint warmth, and no redness over joints.   Extremities:  No clubbing, cyanosis, or deformity noted with normal full range of motion of all joints. Neurologic:  No cranial nerve deficits noted. Station and gait are normal.Sensory, motor and coordinative functions appear intact. Skin:  turgor normal, color normal, and no rashes.   Psych:  Oriented X3, memory intact for recent and remote, normally interactive, and good eye contact.     Impression & Recommendations:  Problem # 1:  AODM (ICD-250.00) Review of pts CBGs  reveals her pre/post prandial measurements are similar and average in the mid 200s.  The majority of her mealtime measurements are taken at breakfast and lunch; not as much data is availble for dinner or CBG readings later in the day/before bed.  No hypoglycemia noted.  WIll increase pts Lantus to 42u at bedtime and continue her mealtime coverage at 3 units. Encouarged her to continue checking her CBGs regularly including fasting, pre/post prandials at all meals.  Advised her to call clinic if she has any hypoglycemic events or if ehr CBGs remain elvated in the 300s.  Her updated medication list for this problem includes:    Aspir-low 81 Mg Tbec (Aspirin) .Marland Kitchen... Take 1 tablet by mouth once a day    Lantus 100 Unit/ml Soln (Insulin glargine) ..... Inject 42 units subcutaneously daily    Prinzide 10-12.5 Mg Tabs (Lisinopril-hydrochlorothiazide) .Marland Kitchen... Take 1 tablet by mouth once a day    Novolog 100 Unit/ml Soln (Insulin aspart) ..... Inject 3 units subcutaneously 15 minutesbefore lunch.  Labs Reviewed: Creat: 0.82 (10/02/2009)     Last Eye Exam: No  diabetic retinopathy.    (09/09/2007) Reviewed HgBA1c results: 8.0 (02/14/2010)  7.3 (11/08/2009)  Problem # 2:  URINARY URGENCY (ZTI-458.09) WIll check U/A to eval for possible UTI.  Orders: T-Urinalysis ([id number])  Problem # 3:  PREVENTIVE HEALTH CARE (ICD-V70.0) Pt reports plan for stress test following hospital d/c "months ago."  States she never followed up with stress test but is willing to pursue this now.  She denies any c/p, sob, doe, or syncope.  I am unable to quickly locate any plan outlined in recent hospital d/c summaries outlining plan for outpt stress test.  WIll continue to investigate this in more detail.  Complete Medication List: 1)  Prilosec 40 Mg Cpdr (Omeprazole) .... Take 2 tablets by mouth once a day 2)  Alprazolam 1 Mg Tabs (Alprazolam) .... Take 1 tablet by mouth two times a day as needed for anxiety and sleep 3)  Albuterol 90 Mcg/act Aers (Albuterol) .... Two puffs every four hours as needed for shortness of breath 4)  Spiriva Handihaler 18 Mcg Caps (Tiotropium bromide monohydrate) .... Inhale contents of 1 capsule once a day 5)  Crestor 20 Mg Tabs (Rosuvastatin calcium) .... Take 1 tablet by mouth once a day 6)  Advair Diskus 250-50 Mcg/dose Misc (Fluticasone-salmeterol) .Marland Kitchen.. 1 puff twice daily 7)  Cymbalta 30 Mg Cpep (Duloxetine hcl) .... Take 3  tables by mouth once daily 8)  Aspir-low 81 Mg Tbec (Aspirin) .... Take 1 tablet by mouth once a day 9)  Lantus 100 Unit/ml Soln (Insulin glargine) .... Inject 42 units subcutaneously daily 10)  Onetouch Ultra Test Strp (Glucose blood) .... Use to test blood sugars three times a day 11)  Insulin Syringe 31g X 5/16" 0.3 Ml Misc (Insulin syringe-needle u-100) .... Use to inject lantus insulin once daily 12)  Loratadine 10 Mg Tabs (Loratadine) .... Take 1 tablet by mouth once a day 13)  Wellbutrin Xl 150 Mg Xr24h-tab (Bupropion hcl) .... Take 1 tablet once daily 14)  Colace 100 Mg Caps (Docusate sodium) .... Take 1  tablet by mouth two times a day 15)  Prinzide 10-12.5 Mg Tabs (Lisinopril-hydrochlorothiazide) .... Take 1 tablet by mouth once a day 16)  Th Flax Seed Oil 1000 Mg Caps (Flaxseed (linseed)) .... 2 tablespoons daily 17)  Norco 10-325 Mg Tabs (Hydrocodone-acetaminophen) .... Take 1 tablet by mouth four times a day as needed pain 18)  Novolog 100  Unit/ml Soln (Insulin aspart) .... Inject 3 units subcutaneously 15 minutesbefore lunch.  Patient Instructions: 1)  Please schedule a follow-up appointment in 2-3 months with Dr. Jerelene Redden. 2)  Your Lantus was increased to 42units every night. 3)  Give [place] a call if your blood sugars remain above 300 or if your have any lows.  We may need to adjust your insulin further. 4)  Your alprazolam was increased to 47m two times a day as needed for anxiety, trouble sleeping, or for muscle cramps. 5)  USe as directed. 6)  I will call you if your urine analysis is not normal. Prescriptions: ALPRAZOLAM 1 MG TABS (ALPRAZOLAM) Take 1 tablet by mouth two times a day as needed for anxiety and sleep  #60 x 3   Entered by:   ITrinidad CuretMD   Authorized by:   KEduardo OsierDO   Signed by:   KEduardo OsierDO on 03/15/2010   Method used:   Telephoned to ...       [facility]       [address]  [address]      Ph: [phone]      Fax: [phone]  RxID:   [id number] 100 UNIT/ML SOLN (INSULIN GLARGINE) Inject 42 units subcutaneously daily  #158moupply x 3   Entered and Authorized by:   KrEduardo OsierO   Signed by:   KrEduardo OsierO on 03/08/2010   Method used:   Faxed to ...       [facility]       [address]  [address]     Ph: [phone]     Fax: [phone] RxID:   [id number]  Orders Added: 1)  T-Urinalysis [[id number]])  Est. Patient Level III [[id number][83338] Process Orders Check Orders Results:     [facility]: Check successful Tests Sent for requisitioning (March 15, 2010 11:07 PM):     03/08/2010: [facility] -- T-Urinalysis [[id number]]signed)     Prevention & Chronic Care Immunizations   Influenza vaccine: 11/23/09  (11/23/2009)   Influenza vaccine deferral: Deferred  (01/10/2010)   Influenza vaccine due: 11/27/2006    Tetanus booster: 10/19/2006: Td   Td booster deferral: Deferred  (01/10/2010)   Tetanus booster due: 10/18/2016    Pneumococcal vaccine: Not documented    H. zoster vaccine: Not documented   H. zoster vaccine deferral: Not indicated  (08/28/2009)  Colorectal Screening   Hemoccult: Not documented   Hemoccult action/deferral: Not indicated  (08/28/2009)    Colonoscopy: normal  (08/18/2005)   Colonoscopy action/deferral: Not indicated  (03/22/2009)   Colonoscopy due: 08/19/2015  Other Screening   Pap smear:  Specimen Adequacy: Satisfactory for evaluation.   Interpretation/Result:Negative for intraepithelial Lesion or Malignancy.     (02/14/2010)   Pap smear action/deferral: Deferred  (01/10/2010)   Pap smear due: 02/2013    Mammogram: ASSESSMENT: Negative - BI-RADS 1^MM DIGITAL SCREENING  (02/14/2010)   Mammogram action/deferral: Ordered  (01/10/2010)   Mammogram due: 08/02/2008    DXA bone density scan: Not documented   Smoking status: current  (03/08/2010)   Smoking cessation counseling: yes  (03/08/2010)  Diabetes Mellitus   HgbA1C: 8.0  (02/14/2010)   HgbA1C action/deferral: Ordered  (06/20/2009)   Hemoglobin A1C due: 02/07/2010  Eye exam: No diabetic retinopathy.     (09/09/2007)   Diabetic eye exam action/deferral: Deferred  (01/10/2010)   Eye exam due: 09/2008    Foot exam: yes  (09/14/2009)   Foot exam action/deferral: Do today   High risk foot: Not documented   Foot care education: Not documented   Foot exam due: 09/15/2010    Urine microalbumin/creatinine ratio: 13.9  (11/09/2009)   Urine microalbumin  action/deferral: Ordered   Urine microalbumin/cr due: 11/10/2010  Lipids   Total Cholesterol: 111  (03/22/2009)   Lipid panel action/deferral: Lipid Panel ordered   LDL: 45  (03/22/2009)   LDL Direct: Not documented   HDL: 34  (03/22/2009)   Triglycerides: 159  (03/22/2009)   Lipid panel due: 03/22/2010    SGOT (AST): 14  (08/28/2009)   SGPT (ALT): 12  (08/28/2009)   Alkaline phosphatase: 38  (08/28/2009)   Total bilirubin: 0.4  (08/28/2009)  Hypertension   Last Blood Pressure: 120 / 61  (03/08/2010)   Serum creatinine: 0.82  (10/02/2009)   Serum potassium 4.5  (10/02/2009)  Self-Management Support :   Personal Goals (by the next clinic visit) :     Personal A1C goal: 7  (03/22/2009)     Personal blood pressure goal: 130/80  (03/22/2009)     Personal LDL goal: 100  (03/22/2009)    Patient will work on the following items until the next clinic visit to reach self-care goals:     Medications and monitoring: take my medicines every day, check my blood sugar, bring all of my medications to every visit, examine my feet every day  (03/08/2010)     Eating: drink diet soda or water instead of juice or soda, eat foods that are low in salt, eat baked foods instead of fried foods, limit or avoid alcohol  (03/08/2010)     Activity: take a 30 minute walk every day  (03/08/2010)    Diabetes self-management support: Resources for patients handout  (03/08/2010)   Last diabetes self-management training by diabetes educator: 06/20/2009    Hypertension self-management support: Resources for patients handout  (03/08/2010)    Lipid self-management support: Resources for patients handout  (03/08/2010)     Self-management comments: PATIENT WALKS TO THE MAIL BOX TWICE A WEEK      Resource handout printed.

## 2010-03-20 NOTE — Progress Notes (Signed)
Summary: Bowel  movements  Phone Note Call from Patient   Caller: Patient Call For: Trinidad Curet MD Summary of Call: Pt said that she was not feeling bad on yesterday.  Was constipated.  Had been 5 days. Used a suppository.  Painful got in the floor on her hands and knees.  Had a bowel movement.  Bowel movement was hard.  Had a lot of pain.  Used a heating pad for her siide.  No appetite this am.  Took 2 Colace and drank hot coffee.  Had another bowel movement without pain.  No pain at the movement.  Is pain free at the moment  What will she need to do if this happens again.  Is taking Colace wants to know what she should do.  Pt uses the [facility]. Initial call taken by: Sander Nephew RN,  March 12, 2010 3:05 PM

## 2010-03-28 ENCOUNTER — Other Ambulatory Visit: Payer: Self-pay | Admitting: *Deleted

## 2010-03-28 DIAGNOSIS — R0789 Other chest pain: Secondary | ICD-10-CM

## 2010-03-28 NOTE — Telephone Encounter (Addendum)
Pt calls and requests a referral to cardiology, had one about 9 mon ago but missed appt due to be inpt hosp, please send to your nurse to complete

## 2010-04-04 MED ORDER — HYDROCODONE-ACETAMINOPHEN 10-325 MG PO TABS: ORAL_TABLET | ORAL | Status: DC

## 2010-04-04 MED ORDER — ALPRAZOLAM 1 MG PO TABS: ORAL_TABLET | ORAL | Status: DC

## 2010-04-04 NOTE — Telephone Encounter (Signed)
Called to pharm, pt informed

## 2010-04-05 ENCOUNTER — Encounter: Payer: Self-pay | Admitting: Internal Medicine

## 2010-04-23 ENCOUNTER — Other Ambulatory Visit: Payer: Self-pay | Admitting: Internal Medicine

## 2010-04-24 LAB — GLUCOSE, CAPILLARY: Glucose-Capillary: 249 mg/dL — ABNORMAL HIGH (ref 70–99)

## 2010-04-25 LAB — GLUCOSE, CAPILLARY: Glucose-Capillary: 196 mg/dL — ABNORMAL HIGH (ref 70–99)

## 2010-04-26 LAB — GLUCOSE, CAPILLARY: Glucose-Capillary: 139 mg/dL — ABNORMAL HIGH (ref 70–99)

## 2010-04-27 LAB — COMPREHENSIVE METABOLIC PANEL
ALT: 11 U/L (ref 0–35)
ALT: 12 U/L (ref 0–35)
ALT: 14 U/L (ref 0–35)
AST: 13 U/L (ref 0–37)
AST: 14 U/L (ref 0–37)
AST: 15 U/L (ref 0–37)
Albumin: 3.5 g/dL (ref 3.5–5.2)
Albumin: 3.5 g/dL (ref 3.5–5.2)
Albumin: 3.6 g/dL (ref 3.5–5.2)
Alkaline Phosphatase: 38 U/L — ABNORMAL LOW (ref 39–117)
Alkaline Phosphatase: 39 U/L (ref 39–117)
Alkaline Phosphatase: 41 U/L (ref 39–117)
BUN: 15 mg/dL (ref 6–23)
BUN: 8 mg/dL (ref 6–23)
BUN: 9 mg/dL (ref 6–23)
CO2: 25 mEq/L (ref 19–32)
CO2: 25 mEq/L (ref 19–32)
CO2: 30 mEq/L (ref 19–32)
Calcium: 9.2 mg/dL (ref 8.4–10.5)
Calcium: 9.4 mg/dL (ref 8.4–10.5)
Calcium: 9.5 mg/dL (ref 8.4–10.5)
Chloride: 104 mEq/L (ref 96–112)
Chloride: 107 mEq/L (ref 96–112)
Chloride: 108 mEq/L (ref 96–112)
Creatinine, Ser: 0.75 mg/dL (ref 0.4–1.2)
Creatinine, Ser: 0.77 mg/dL (ref 0.4–1.2)
Creatinine, Ser: 0.85 mg/dL (ref 0.4–1.2)
GFR calc Af Amer: >60 mL/min (ref >60)
GFR calc Af Amer: >60 mL/min (ref >60)
GFR calc Af Amer: >60 mL/min (ref >60)
GFR calc non Af Amer: >60 mL/min (ref >60)
GFR calc non Af Amer: >60 mL/min (ref >60)
GFR calc non Af Amer: >60 mL/min (ref >60)
Glucose, Bld: 109 mg/dL — ABNORMAL HIGH (ref 70–99)
Glucose, Bld: 82 mg/dL (ref 70–99)
Glucose, Bld: 82 mg/dL (ref 70–99)
Potassium: 3.7 mEq/L (ref 3.5–5.1)
Potassium: 4 mEq/L (ref 3.5–5.1)
Potassium: 4.1 mEq/L (ref 3.5–5.1)
Sodium: 139 mEq/L (ref 135–145)
Sodium: 142 mEq/L (ref 135–145)
Sodium: 143 mEq/L (ref 135–145)
Total Bilirubin: 0.5 mg/dL (ref 0.3–1.2)
Total Bilirubin: 0.6 mg/dL (ref 0.3–1.2)
Total Bilirubin: 0.7 mg/dL (ref 0.3–1.2)
Total Protein: 6.5 g/dL (ref 6.0–8.3)
Total Protein: 6.7 g/dL (ref 6.0–8.3)
Total Protein: 6.9 g/dL (ref 6.0–8.3)

## 2010-04-27 LAB — URINE MICROSCOPIC-ADD ON

## 2010-04-27 LAB — IRON AND TIBC
Iron: 21 ug/dL — ABNORMAL LOW (ref 42–135)
Saturation Ratios: 5 % — ABNORMAL LOW (ref 20–55)
TIBC: 441 ug/dL (ref 250–470)
UIBC: 420 ug/dL

## 2010-04-27 LAB — DIFFERENTIAL
Basophils Absolute: 0 10*3/uL (ref 0.0–0.1)
Basophils Absolute: 0.1 10*3/uL (ref 0.0–0.1)
Basophils Relative: 1 % (ref 0–1)
Basophils Relative: 1 % (ref 0–1)
Eosinophils Absolute: 0.1 10*3/uL (ref 0.0–0.7)
Eosinophils Absolute: 0.1 10*3/uL (ref 0.0–0.7)
Eosinophils Relative: 1 % (ref 0–5)
Eosinophils Relative: 1 % (ref 0–5)
Lymphocytes Relative: 18 % (ref 12–46)
Lymphocytes Relative: 23 % (ref 12–46)
Lymphs Abs: 1.8 10*3/uL (ref 0.7–4.0)
Lymphs Abs: 2 10*3/uL (ref 0.7–4.0)
Monocytes Absolute: 0.8 10*3/uL (ref 0.1–1.0)
Monocytes Absolute: 0.9 10*3/uL (ref 0.1–1.0)
Monocytes Relative: 10 % (ref 3–12)
Monocytes Relative: 8 % (ref 3–12)
Neutro Abs: 5.1 10*3/uL (ref 1.7–7.7)
Neutro Abs: 7.7 10*3/uL (ref 1.7–7.7)
Neutrophils Relative %: 65 % (ref 43–77)
Neutrophils Relative %: 71 % (ref 43–77)

## 2010-04-27 LAB — CROSSMATCH
ABO/RH(D): A NEG
Antibody Screen: NEGATIVE

## 2010-04-27 LAB — URINALYSIS, ROUTINE W REFLEX MICROSCOPIC
Glucose, UA: NEGATIVE mg/dL
Hgb urine dipstick: NEGATIVE
Ketones, ur: NEGATIVE mg/dL
Nitrite: NEGATIVE
Protein, ur: NEGATIVE mg/dL
Specific Gravity, Urine: 1.019 (ref 1.005–1.030)
Urobilinogen, UA: 1 mg/dL (ref 0.0–1.0)
pH: 5.5 (ref 5.0–8.0)

## 2010-04-27 LAB — GLUCOSE, CAPILLARY
Glucose-Capillary: 102 mg/dL — ABNORMAL HIGH (ref 70–99)
Glucose-Capillary: 104 mg/dL — ABNORMAL HIGH (ref 70–99)
Glucose-Capillary: 105 mg/dL — ABNORMAL HIGH (ref 70–99)
Glucose-Capillary: 112 mg/dL — ABNORMAL HIGH (ref 70–99)
Glucose-Capillary: 115 mg/dL — ABNORMAL HIGH (ref 70–99)
Glucose-Capillary: 119 mg/dL — ABNORMAL HIGH (ref 70–99)
Glucose-Capillary: 121 mg/dL — ABNORMAL HIGH (ref 70–99)
Glucose-Capillary: 127 mg/dL — ABNORMAL HIGH (ref 70–99)
Glucose-Capillary: 128 mg/dL — ABNORMAL HIGH (ref 70–99)
Glucose-Capillary: 129 mg/dL — ABNORMAL HIGH (ref 70–99)
Glucose-Capillary: 132 mg/dL — ABNORMAL HIGH (ref 70–99)
Glucose-Capillary: 150 mg/dL — ABNORMAL HIGH (ref 70–99)
Glucose-Capillary: 150 mg/dL — ABNORMAL HIGH (ref 70–99)
Glucose-Capillary: 158 mg/dL — ABNORMAL HIGH (ref 70–99)
Glucose-Capillary: 37 mg/dL — CL (ref 70–99)
Glucose-Capillary: 91 mg/dL (ref 70–99)
Glucose-Capillary: 91 mg/dL (ref 70–99)
Glucose-Capillary: 92 mg/dL (ref 70–99)
Glucose-Capillary: 94 mg/dL (ref 70–99)

## 2010-04-27 LAB — CARDIAC PANEL(CRET KIN+CKTOT+MB+TROPI)
CK, MB: 2.2 ng/mL (ref 0.3–4.0)
CK, MB: 2.4 ng/mL (ref 0.3–4.0)
CK, MB: 2.5 ng/mL (ref 0.3–4.0)
Relative Index: 2.2 (ref 0.0–2.5)
Relative Index: INVALID (ref 0.0–2.5)
Relative Index: INVALID (ref 0.0–2.5)
Total CK: 113 U/L (ref 7–177)
Total CK: 61 U/L (ref 7–177)
Total CK: 74 U/L (ref 7–177)
Troponin I: 0.01 ng/mL (ref 0.00–0.06)
Troponin I: 0.01 ng/mL (ref 0.00–0.06)
Troponin I: 0.01 ng/mL (ref 0.00–0.06)

## 2010-04-27 LAB — CBC
HCT: 24.5 % — ABNORMAL LOW (ref 36.0–46.0)
HCT: 25 % — ABNORMAL LOW (ref 36.0–46.0)
HCT: 27.2 % — ABNORMAL LOW (ref 36.0–46.0)
HCT: 28.3 % — ABNORMAL LOW (ref 36.0–46.0)
HCT: 28.6 % — ABNORMAL LOW (ref 36.0–46.0)
Hemoglobin: 7.9 g/dL — ABNORMAL LOW (ref 12.0–15.0)
Hemoglobin: 7.9 g/dL — ABNORMAL LOW (ref 12.0–15.0)
Hemoglobin: 8.9 g/dL — ABNORMAL LOW (ref 12.0–15.0)
Hemoglobin: 9.2 g/dL — ABNORMAL LOW (ref 12.0–15.0)
Hemoglobin: 9.2 g/dL — ABNORMAL LOW (ref 12.0–15.0)
MCH: 24.6 pg — ABNORMAL LOW (ref 26.0–34.0)
MCH: 25.1 pg — ABNORMAL LOW (ref 26.0–34.0)
MCH: 25.9 pg — ABNORMAL LOW (ref 26.0–34.0)
MCH: 26.2 pg (ref 26.0–34.0)
MCH: 26.5 pg (ref 26.0–34.0)
MCHC: 31.8 g/dL (ref 30.0–36.0)
MCHC: 32.1 g/dL (ref 30.0–36.0)
MCHC: 32.4 g/dL (ref 30.0–36.0)
MCHC: 32.5 g/dL (ref 30.0–36.0)
MCHC: 32.7 g/dL (ref 30.0–36.0)
MCV: 77.4 fL — ABNORMAL LOW (ref 78.0–100.0)
MCV: 77.5 fL — ABNORMAL LOW (ref 78.0–100.0)
MCV: 80.8 fL (ref 78.0–100.0)
MCV: 80.9 fL (ref 78.0–100.0)
MCV: 81.3 fL (ref 78.0–100.0)
Platelets: 178 10*3/uL (ref 150–400)
Platelets: 192 10*3/uL (ref 150–400)
Platelets: 221 10*3/uL (ref 150–400)
Platelets: 235 10*3/uL (ref 150–400)
Platelets: 242 10*3/uL (ref 150–400)
RBC: 3.16 MIL/uL — ABNORMAL LOW (ref 3.87–5.11)
RBC: 3.23 MIL/uL — ABNORMAL LOW (ref 3.87–5.11)
RBC: 3.34 MIL/uL — ABNORMAL LOW (ref 3.87–5.11)
RBC: 3.51 MIL/uL — ABNORMAL LOW (ref 3.87–5.11)
RBC: 3.54 MIL/uL — ABNORMAL LOW (ref 3.87–5.11)
RDW: 18.4 % — ABNORMAL HIGH (ref 11.5–15.5)
RDW: 18.5 % — ABNORMAL HIGH (ref 11.5–15.5)
RDW: 18.5 % — ABNORMAL HIGH (ref 11.5–15.5)
RDW: 19.7 % — ABNORMAL HIGH (ref 11.5–15.5)
RDW: 20.5 % — ABNORMAL HIGH (ref 11.5–15.5)
WBC: 10.8 10*3/uL — ABNORMAL HIGH (ref 4.0–10.5)
WBC: 7.3 10*3/uL (ref 4.0–10.5)
WBC: 7.8 10*3/uL (ref 4.0–10.5)
WBC: 7.8 10*3/uL (ref 4.0–10.5)
WBC: 9.6 10*3/uL (ref 4.0–10.5)

## 2010-04-27 LAB — PROTIME-INR
INR: 1.2 (ref 0.00–1.49)
Prothrombin Time: 15.1 seconds (ref 11.6–15.2)

## 2010-04-27 LAB — HEMOCCULT GUIAC POC 1CARD (OFFICE): Fecal Occult Bld: NEGATIVE

## 2010-04-27 LAB — LACTATE DEHYDROGENASE
LDH: 148 U/L (ref 94–250)
LDH: 152 U/L (ref 94–250)

## 2010-04-27 LAB — RETICULOCYTES
RBC.: 3.19 MIL/uL — ABNORMAL LOW (ref 3.87–5.11)
Retic Count, Absolute: 108.5 10*3/uL (ref 19.0–186.0)
Retic Ct Pct: 3.4 % — ABNORMAL HIGH (ref 0.4–3.1)

## 2010-04-27 LAB — BASIC METABOLIC PANEL
BUN: 15 mg/dL (ref 6–23)
CO2: 26 mEq/L (ref 19–32)
Calcium: 8.9 mg/dL (ref 8.4–10.5)
Chloride: 102 mEq/L (ref 96–112)
Creatinine, Ser: 0.81 mg/dL (ref 0.4–1.2)
GFR calc Af Amer: >60 mL/min (ref >60)
GFR calc non Af Amer: >60 mL/min (ref >60)
Glucose, Bld: 135 mg/dL — ABNORMAL HIGH (ref 70–99)
Potassium: 3.7 mEq/L (ref 3.5–5.1)
Sodium: 135 mEq/L (ref 135–145)

## 2010-04-27 LAB — HAPTOGLOBIN
Haptoglobin: 231 mg/dL — ABNORMAL HIGH (ref 16–200)
Haptoglobin: 232 mg/dL — ABNORMAL HIGH (ref 16–200)

## 2010-04-27 LAB — APTT: aPTT: 30 seconds (ref 24–37)

## 2010-04-27 LAB — LIPASE, BLOOD: Lipase: 20 U/L (ref 11–59)

## 2010-04-27 LAB — FERRITIN: Ferritin: 11 ng/mL (ref 10–291)

## 2010-04-30 LAB — GLUCOSE, CAPILLARY: Glucose-Capillary: 88 mg/dL (ref 70–99)

## 2010-05-01 LAB — BASIC METABOLIC PANEL
BUN: 11 mg/dL (ref 6–23)
BUN: 13 mg/dL (ref 6–23)
CO2: 27 mEq/L (ref 19–32)
CO2: 29 mEq/L (ref 19–32)
Calcium: 8.1 mg/dL — ABNORMAL LOW (ref 8.4–10.5)
Calcium: 8.3 mg/dL — ABNORMAL LOW (ref 8.4–10.5)
Chloride: 104 mEq/L (ref 96–112)
Chloride: 107 mEq/L (ref 96–112)
Creatinine, Ser: 0.76 mg/dL (ref 0.4–1.2)
Creatinine, Ser: 0.76 mg/dL (ref 0.4–1.2)
GFR calc Af Amer: >60 mL/min (ref >60)
GFR calc Af Amer: >60 mL/min (ref >60)
GFR calc non Af Amer: >60 mL/min (ref >60)
GFR calc non Af Amer: >60 mL/min (ref >60)
Glucose, Bld: 142 mg/dL — ABNORMAL HIGH (ref 70–99)
Glucose, Bld: 146 mg/dL — ABNORMAL HIGH (ref 70–99)
Potassium: 3.7 mEq/L (ref 3.5–5.1)
Potassium: 3.7 mEq/L (ref 3.5–5.1)
Sodium: 138 mEq/L (ref 135–145)
Sodium: 139 mEq/L (ref 135–145)

## 2010-05-01 LAB — GLUCOSE, CAPILLARY
Glucose-Capillary: 108 mg/dL — ABNORMAL HIGH (ref 70–99)
Glucose-Capillary: 111 mg/dL — ABNORMAL HIGH (ref 70–99)
Glucose-Capillary: 125 mg/dL — ABNORMAL HIGH (ref 70–99)
Glucose-Capillary: 126 mg/dL — ABNORMAL HIGH (ref 70–99)
Glucose-Capillary: 132 mg/dL — ABNORMAL HIGH (ref 70–99)
Glucose-Capillary: 137 mg/dL — ABNORMAL HIGH (ref 70–99)
Glucose-Capillary: 147 mg/dL — ABNORMAL HIGH (ref 70–99)
Glucose-Capillary: 159 mg/dL — ABNORMAL HIGH (ref 70–99)
Glucose-Capillary: 161 mg/dL — ABNORMAL HIGH (ref 70–99)
Glucose-Capillary: 168 mg/dL — ABNORMAL HIGH (ref 70–99)
Glucose-Capillary: 179 mg/dL — ABNORMAL HIGH (ref 70–99)
Glucose-Capillary: 58 mg/dL — ABNORMAL LOW (ref 70–99)
Glucose-Capillary: 69 mg/dL — ABNORMAL LOW (ref 70–99)
Glucose-Capillary: 74 mg/dL (ref 70–99)

## 2010-05-01 LAB — CBC
HCT: 27.8 % — ABNORMAL LOW (ref 36.0–46.0)
HCT: 32.3 % — ABNORMAL LOW (ref 36.0–46.0)
HCT: 34.1 % — ABNORMAL LOW (ref 36.0–46.0)
Hemoglobin: 11.1 g/dL — ABNORMAL LOW (ref 12.0–15.0)
Hemoglobin: 11.7 g/dL — ABNORMAL LOW (ref 12.0–15.0)
Hemoglobin: 9.5 g/dL — ABNORMAL LOW (ref 12.0–15.0)
MCHC: 34 g/dL (ref 30.0–36.0)
MCHC: 34.3 g/dL (ref 30.0–36.0)
MCHC: 34.3 g/dL (ref 30.0–36.0)
MCV: 89.1 fL (ref 78.0–100.0)
MCV: 89.6 fL (ref 78.0–100.0)
MCV: 89.7 fL (ref 78.0–100.0)
Platelets: 128 10*3/uL — ABNORMAL LOW (ref 150–400)
Platelets: 155 10*3/uL (ref 150–400)
Platelets: 169 10*3/uL (ref 150–400)
RBC: 3.1 MIL/uL — ABNORMAL LOW (ref 3.87–5.11)
RBC: 3.63 MIL/uL — ABNORMAL LOW (ref 3.87–5.11)
RBC: 3.8 MIL/uL — ABNORMAL LOW (ref 3.87–5.11)
RDW: 16.2 % — ABNORMAL HIGH (ref 11.5–15.5)
RDW: 16.4 % — ABNORMAL HIGH (ref 11.5–15.5)
RDW: 16.6 % — ABNORMAL HIGH (ref 11.5–15.5)
WBC: 3.4 10*3/uL — ABNORMAL LOW (ref 4.0–10.5)
WBC: 4.8 10*3/uL (ref 4.0–10.5)
WBC: 5.9 10*3/uL (ref 4.0–10.5)

## 2010-05-01 LAB — COMPREHENSIVE METABOLIC PANEL
ALT: 18 U/L (ref 0–35)
AST: 19 U/L (ref 0–37)
Albumin: 3.5 g/dL (ref 3.5–5.2)
Alkaline Phosphatase: 36 U/L — ABNORMAL LOW (ref 39–117)
BUN: 10 mg/dL (ref 6–23)
CO2: 28 mEq/L (ref 19–32)
Calcium: 8.5 mg/dL (ref 8.4–10.5)
Chloride: 102 mEq/L (ref 96–112)
Creatinine, Ser: 0.74 mg/dL (ref 0.4–1.2)
GFR calc Af Amer: >60 mL/min (ref >60)
GFR calc non Af Amer: >60 mL/min (ref >60)
Glucose, Bld: 60 mg/dL — ABNORMAL LOW (ref 70–99)
Potassium: 3.5 mEq/L (ref 3.5–5.1)
Sodium: 136 mEq/L (ref 135–145)
Total Bilirubin: 0.4 mg/dL (ref 0.3–1.2)
Total Protein: 6.4 g/dL (ref 6.0–8.3)

## 2010-05-01 LAB — RAPID URINE DRUG SCREEN, HOSP PERFORMED
Amphetamines: NOT DETECTED
Barbiturates: NOT DETECTED
Benzodiazepines: POSITIVE — AB
Cocaine: NOT DETECTED
Opiates: POSITIVE — AB
Tetrahydrocannabinol: NOT DETECTED

## 2010-05-07 ENCOUNTER — Encounter: Payer: Self-pay | Admitting: Internal Medicine

## 2010-05-19 IMAGING — CR DG RIBS W/ CHEST 3+V*R*
2 series · 2 of 2 positions shown · non-contrast
Comparison: Chest x-ray of 11/03/2006

CLINICAL DATA: Lower lateral rib pain, no known injury

RIGHT RIBS AND CHEST - 3+ VIEW

[w ribs ap/pa lower right]
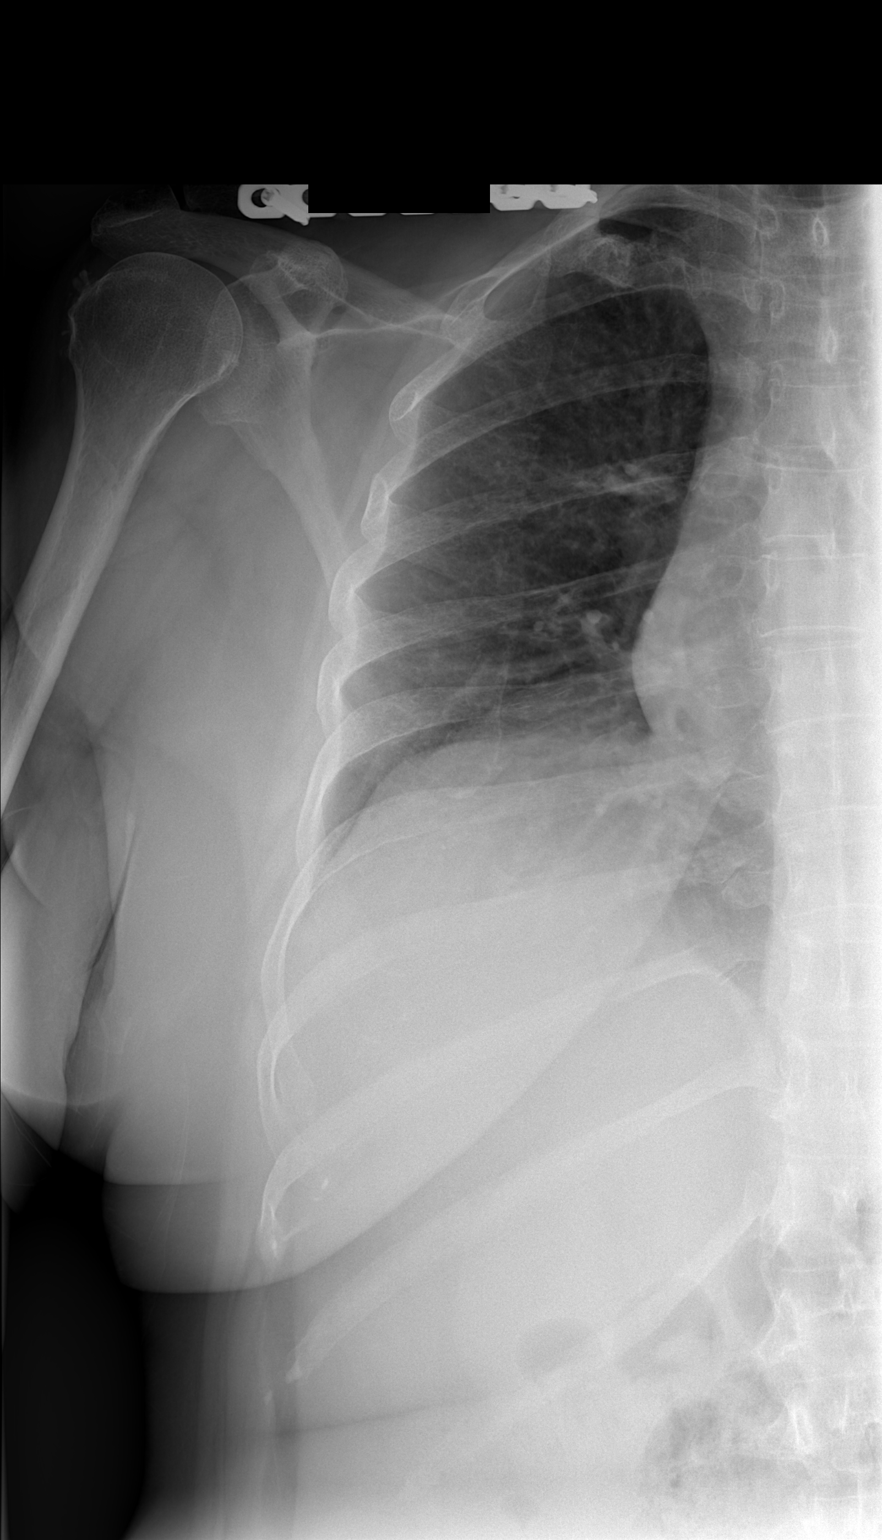

[w ribs oblique right]
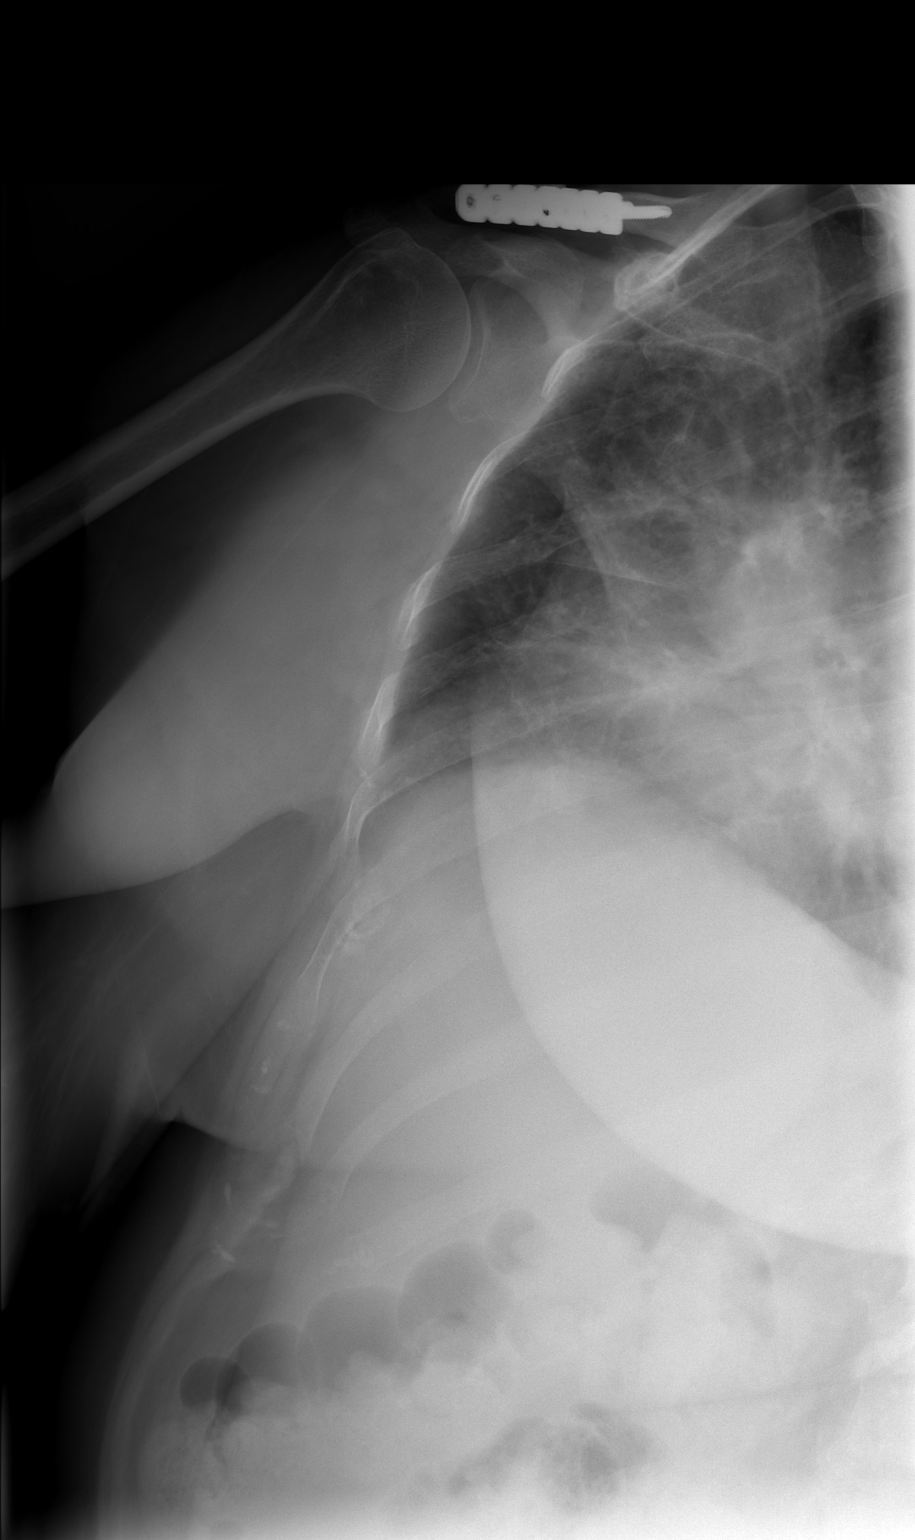

[2 of 2 positions shown; findings below may reference images not displayed]

FINDINGS: Two views of the right ribs show no acute right rib
fracture.
IMPRESSION: Negative right rib detail.

## 2010-05-19 IMAGING — CR DG THORACIC SPINE 2V
3 series · 3 of 3 positions shown · non-contrast
Comparison: Chest x-ray of 11/03/2006

CLINICAL DATA: Lower lateral rib pain, no known injury

THORACIC SPINE - 2 VIEW

[w t-spine a.p.]
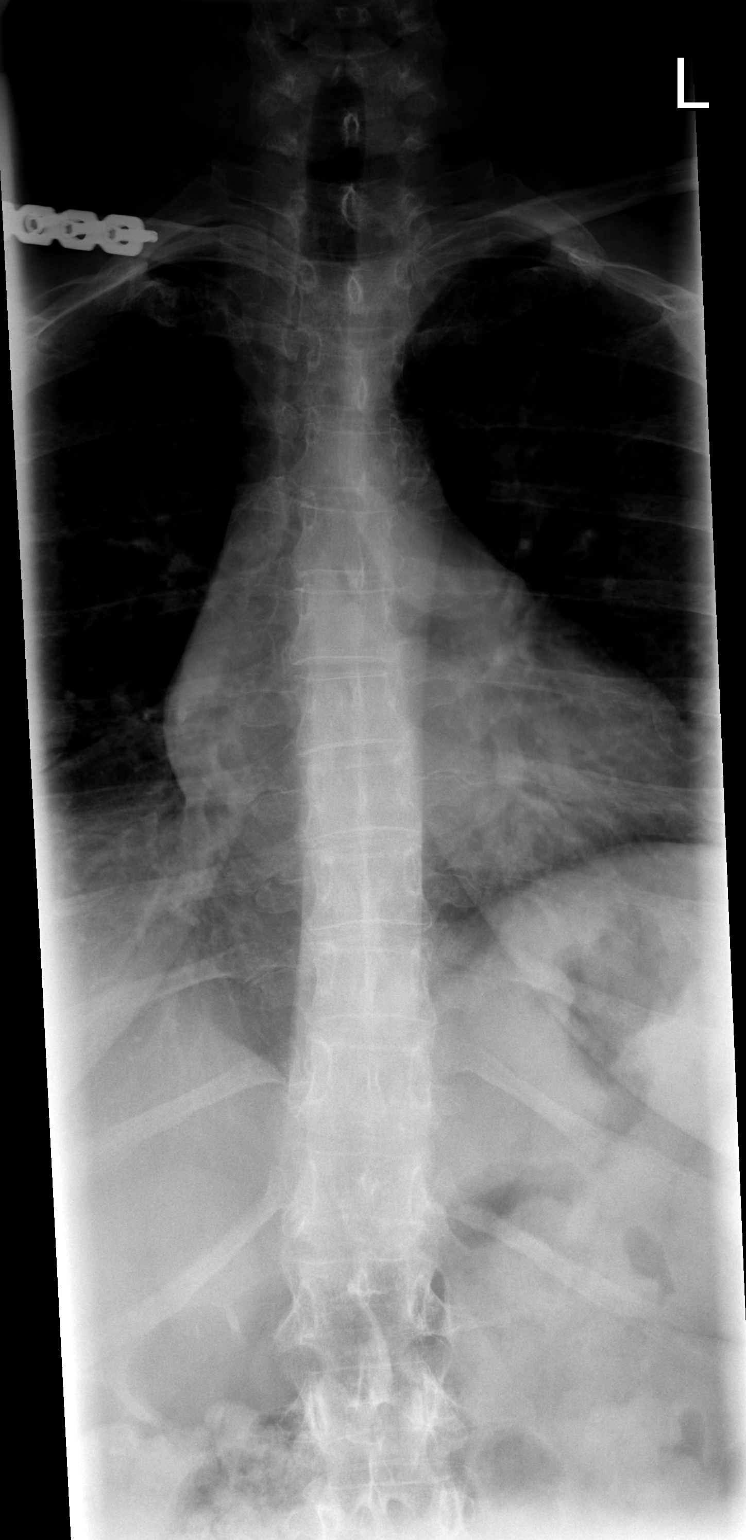

[w t-spine lat]
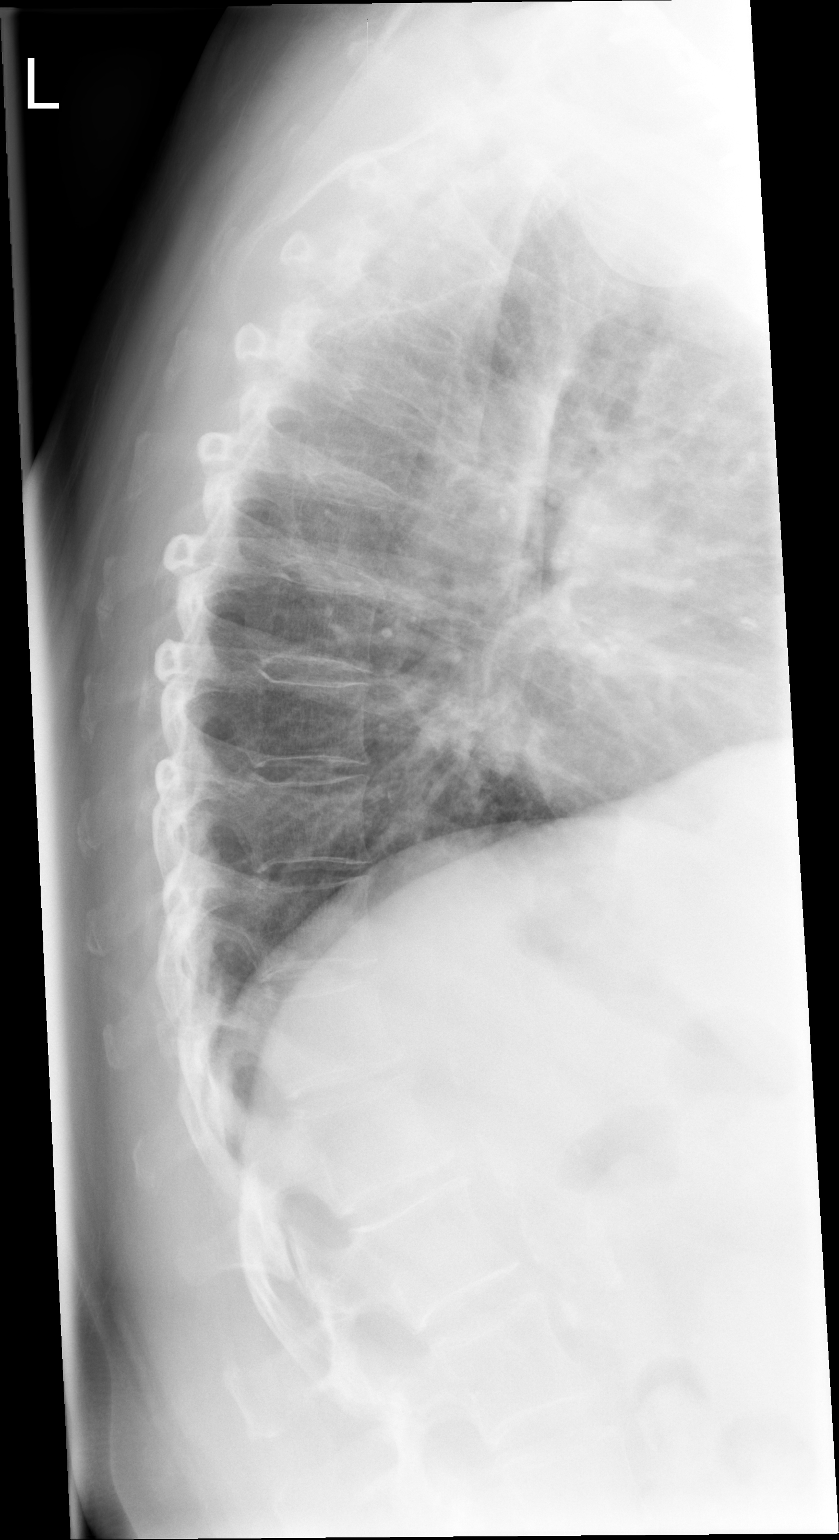

[w swimmers view]
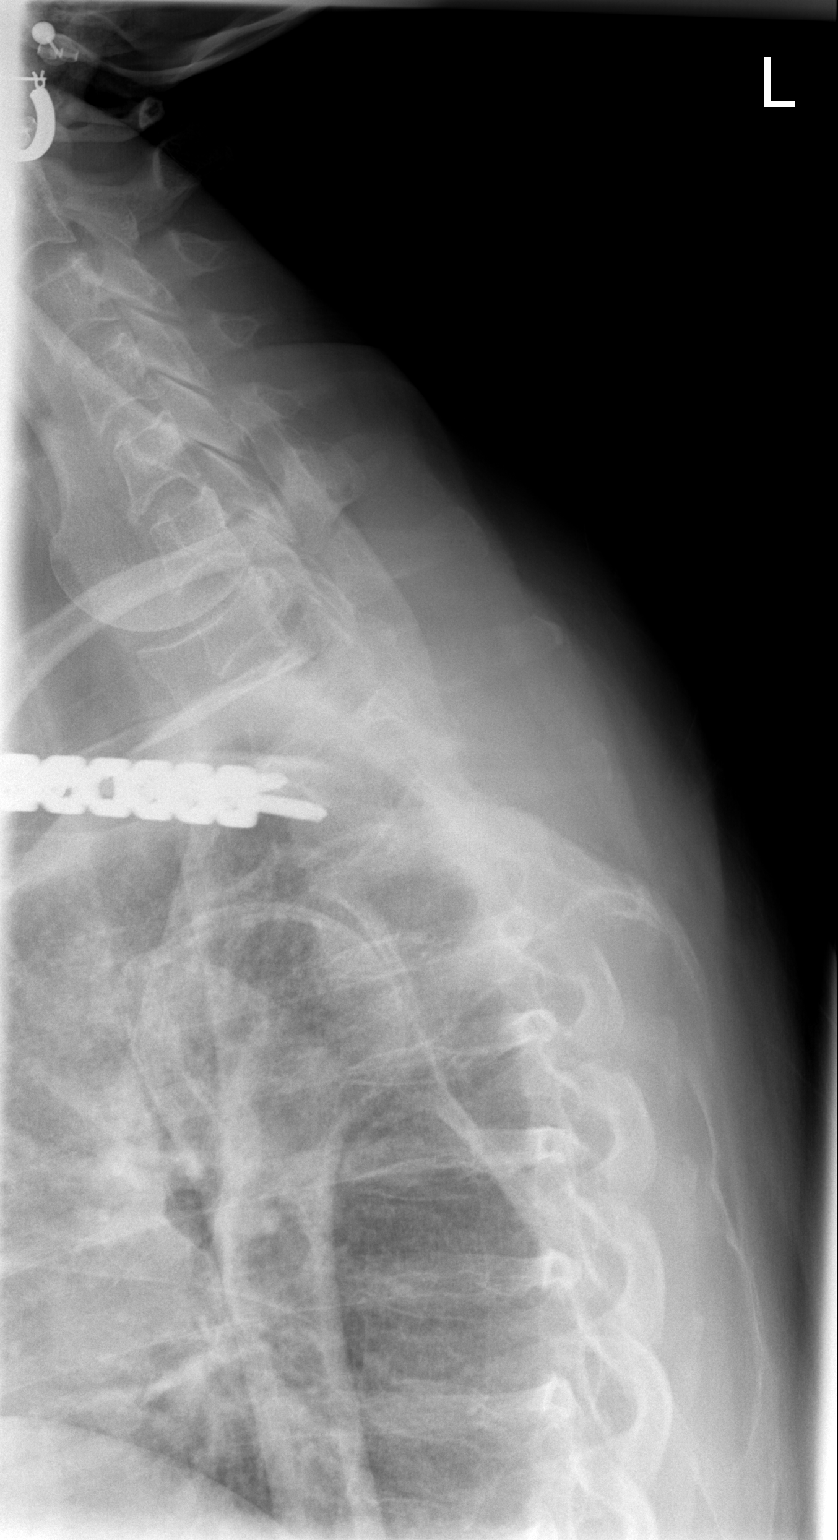

[3 of 3 positions shown; findings below may reference images not displayed]

FINDINGS: The thoracic vertebrae are in normal alignment with no
acute bony abnormality.  No compression deformity is seen.  The
heart is within upper limits of normal.  Fixation plate along the
right clavicle is again noted.
IMPRESSION: No abnormality of the thoracic spine is seen.

## 2010-05-22 ENCOUNTER — Encounter: Payer: Self-pay | Admitting: *Deleted

## 2010-05-22 ENCOUNTER — Encounter: Payer: Self-pay | Admitting: Cardiology

## 2010-05-27 ENCOUNTER — Encounter: Payer: Self-pay | Admitting: Cardiology

## 2010-05-27 ENCOUNTER — Ambulatory Visit (INDEPENDENT_AMBULATORY_CARE_PROVIDER_SITE_OTHER): Payer: Medicare Other | Admitting: Cardiology

## 2010-05-27 DIAGNOSIS — I739 Peripheral vascular disease, unspecified: Secondary | ICD-10-CM | POA: Insufficient documentation

## 2010-05-27 DIAGNOSIS — R011 Cardiac murmur, unspecified: Secondary | ICD-10-CM

## 2010-05-27 DIAGNOSIS — Z720 Tobacco use: Secondary | ICD-10-CM | POA: Insufficient documentation

## 2010-05-27 DIAGNOSIS — I10 Essential (primary) hypertension: Secondary | ICD-10-CM

## 2010-05-27 DIAGNOSIS — F17200 Nicotine dependence, unspecified, uncomplicated: Secondary | ICD-10-CM

## 2010-05-27 DIAGNOSIS — E785 Hyperlipidemia, unspecified: Secondary | ICD-10-CM

## 2010-05-27 DIAGNOSIS — R0989 Other specified symptoms and signs involving the circulatory and respiratory systems: Secondary | ICD-10-CM

## 2010-05-27 DIAGNOSIS — R072 Precordial pain: Secondary | ICD-10-CM

## 2010-05-27 NOTE — Assessment & Plan Note (Signed)
Schedule echocardiogram for followup mitral regurgitation and mitral stenosis.

## 2010-05-27 NOTE — Assessment & Plan Note (Signed)
Blood pressure controlled. Continue present medications.

## 2010-05-27 NOTE — Patient Instructions (Signed)
Your physician recommends that you schedule a follow-up appointment in: [place] has requested that you have a lexiscan myoview. For further information please visit [email or web address]. Please follow instruction sheet, as given.   Your physician has requested that you have an echocardiogram. Echocardiography is a painless test that uses sound waves to create images of your heart. It provides your doctor with information about the size and shape of your heart and how well your heart's chambers and valves are working. This procedure takes approximately one hour. There are no restrictions for this procedure.   Your physician has requested that you have a carotid duplex. This test is an ultrasound of the carotid arteries in your neck. It looks at blood flow through these arteries that supply the brain with blood. Allow one hour for this exam. There are no restrictions or special instructions.  Your physician has requested that you have a lower or upper extremity arterial duplex. This test is an ultrasound of the arteries in the legs or arms. It looks at arterial blood flow in the legs and arms. Allow one hour for Lower and Upper Arterial scans. There are no restrictions or special instructions

## 2010-05-27 NOTE — Assessment & Plan Note (Signed)
Symptoms atypical. Multiple risk factors. Schedule Myoview.

## 2010-05-27 NOTE — Assessment & Plan Note (Signed)
Schedule ABIs.

## 2010-05-27 NOTE — Progress Notes (Signed)
HPI: 60 year old female for evaluation of chest pain. Previously seen by Dr. Domenic Polite. Echocardiogram in 2009 showed normal LV function, mild mitral regurgitation but no significant mitral stenosis with valve area of 2.5 cm. She had mild to moderate tricuspid regurgitation. Patient now complains of chest pain. It is substernal in location and described as pressure. It has been continuous for weeks. It is worse at night. It does not radiate. It is not pleuritic, positional, related to food or exertional. She also has dyspnea on exertion but no orthopnea, PND, pedal edema or syncope. She also has pain in her calves with ambulating less than 5 minutes. Because of the above we were asked to further evaluate.  Current Outpatient Prescriptions  Medication Sig Dispense Refill  . albuterol (PROVENTIL,VENTOLIN) 90 MCG/ACT inhaler Inhale 1-2 puffs into the lungs every 4 (four) hours as needed.        . ALPRAZolam (XANAX) 1 MG tablet Take 1 tablet by mouth twice daily for anxiety and sleep  60 tablet  3  . aspirin 81 MG tablet Take 81 mg by mouth daily.        Marland Kitchen buPROPion (WELLBUTRIN XL) 150 MG 24 hr tablet Take 150 mg by mouth daily.        Marland Kitchen docusate sodium (COLACE) 100 MG capsule Take 100 mg by mouth 2 (two) times daily.        . DULoxetine (CYMBALTA) 30 MG capsule Take 30 mg by mouth daily. Take 3 tablets by mouth once daily       . Fluticasone-Salmeterol (ADVAIR DISKUS) 250-50 MCG/DOSE AEPB Inhale 1 puff into the lungs every 12 (twelve) hours.        Marland Kitchen glucose blood test strip 1 each by Other route as needed. Use as instructed       . HYDROcodone-acetaminophen (NORCO) 10-325 MG per tablet Take 1 tablet 4 times a day as needed for pain  120 tablet  3  . insulin aspart (NOVOLOG) 100 UNIT/ML injection Inject 3 Units into the skin 3 (three) times daily before meals.        . insulin glargine (LANTUS) 100 UNIT/ML injection Inject 40 Units into the skin at bedtime.        . Insulin Syringe-Needle U-100 (INSULIN  SYRINGE .3CC/31GX5/16") 31G X 5/16" 0.3 ML MISC 1 Syringe by Does not apply route.        Marland Kitchen lisinopril-hydrochlorothiazide (PRINZIDE,ZESTORETIC) 10-12.5 MG per tablet TAKE 1 TABLET BY MOUTH DAILY  30 tablet  PRN  . loratadine (CLARITIN) 10 MG tablet Take 10 mg by mouth daily.        Marland Kitchen nystatin (MYCOSTATIN) cream Apply topically as needed.        Marland Kitchen omeprazole (PRILOSEC) 40 MG capsule Take 40 mg by mouth 2 (two) times daily.        . rosuvastatin (CRESTOR) 20 MG tablet Take 20 mg by mouth daily.        Marland Kitchen tiotropium (SPIRIVA) 18 MCG inhalation capsule Place 18 mcg into inhaler and inhale daily.        Marland Kitchen DISCONTD: omeprazole (PRILOSEC) 20 MG capsule Take 40 mg by mouth 2 (two) times daily.          Allergies  Allergen Reactions  . Morphine     REACTION: Unknown reaction  . Tramadol Hcl     REACTION: swelling    Past Medical History  Diagnosis Date  . Depression   . GERD (gastroesophageal reflux disease)   . Hypertension   .  Hyperlipidemia   . Diabetes mellitus   . COPD (chronic obstructive pulmonary disease) 2008     her CT of the chest there was no evidence of primary carcinoid tumor , scattered normal-sized mediastinal and hilar as well as axillary lymph nodes noted., no significant lymphadenopathy. COPD and emphysema with no acute cardiopulmonary disease noted , mild cardiomegaly and solitary calcified  plaque  . Pulmonary hypertension   . Mitral regurgitation   . Shingles   . Anemia   . History of colonic polyps   . Anxiety   . IBS (irritable bowel syndrome)   . Osteoporosis   . Fibromyalgia   . Sleep apnea   . Restless leg syndrome     Past Surgical History  Procedure Date  . Orif clavicle fracture   . Tonsillectomy     History   Social History  . Marital Status: Divorced    Spouse Name: N/A    Number of Children: N/A  . Years of Education: N/A   Occupational History  . Not on file.   Social History Main Topics  . Smoking status: Current Everyday Smoker --  2.0 packs/day for 50 years    Types: Cigarettes  . Smokeless tobacco: Not on file  . Alcohol Use: No  . Drug Use: No  . Sexually Active: Not on file   Other Topics Concern  . Not on file   Social History Narrative  . No narrative on file    Family History  Problem Relation Age of Onset  . Stroke Neg Hx   . Cancer Neg Hx   . Heart disease Neg Hx     ROS: Leg cramps but no fevers or chills, productive cough, hemoptysis, dysphasia, odynophagia, melena, hematochezia, dysuria, hematuria, rash, seizure activity, orthopnea, PND, pedal edema. Remaining systems are negative.  Physical Exam: General:  Well developed/well nourished in NAD Skin warm/dry Patient not depressed No peripheral clubbing Back-normal HEENT-normal/normal eyelids Neck supple/normal carotid upstroke bilaterally; bilateral bruits; no JVD; no thyromegaly chest - diminished breath sounds throughout CV - RRR/normal S1 and S2; no rubs or gallops;  PMI nondisplaced; 2/6 systolic murmur at the apex. 1/6 systolic murmur left sternal border. Abdomen -NT/ND, no HSM, no mass, + bowel sounds, no bruit 1+ femoral pulses, no bruits Ext-no edema, chords, 2+ DP Neuro-grossly nonfocal  ECG Sinus rhythm at a rate of 85. Right axis deviation. Incomplete right bundle branch block. Cannot rule out prior septal infarct.

## 2010-05-27 NOTE — Assessment & Plan Note (Signed)
Continue statin. Lipids and liver monitored by primary care. 

## 2010-05-27 NOTE — Assessment & Plan Note (Signed)
Patient counseled on discontinuing. 

## 2010-05-27 NOTE — Assessment & Plan Note (Signed)
Schedule carotid Dopplers.

## 2010-06-03 ENCOUNTER — Telehealth: Payer: Self-pay | Admitting: Cardiology

## 2010-06-03 NOTE — Telephone Encounter (Signed)
Wing faxed to Sharon/Internal Medicine @ [phone] 06/03/10/km

## 2010-06-11 ENCOUNTER — Ambulatory Visit (HOSPITAL_BASED_OUTPATIENT_CLINIC_OR_DEPARTMENT_OTHER): Payer: Medicare Other | Admitting: Radiology

## 2010-06-11 ENCOUNTER — Encounter (INDEPENDENT_AMBULATORY_CARE_PROVIDER_SITE_OTHER): Payer: Medicare Other | Admitting: *Deleted

## 2010-06-11 ENCOUNTER — Ambulatory Visit (HOSPITAL_COMMUNITY): Payer: Medicare Other | Attending: Cardiology | Admitting: Radiology

## 2010-06-11 DIAGNOSIS — R0989 Other specified symptoms and signs involving the circulatory and respiratory systems: Secondary | ICD-10-CM

## 2010-06-11 DIAGNOSIS — R011 Cardiac murmur, unspecified: Secondary | ICD-10-CM

## 2010-06-11 DIAGNOSIS — R0609 Other forms of dyspnea: Secondary | ICD-10-CM

## 2010-06-11 DIAGNOSIS — I739 Peripheral vascular disease, unspecified: Secondary | ICD-10-CM

## 2010-06-11 DIAGNOSIS — I059 Rheumatic mitral valve disease, unspecified: Secondary | ICD-10-CM

## 2010-06-11 DIAGNOSIS — R0789 Other chest pain: Secondary | ICD-10-CM

## 2010-06-11 DIAGNOSIS — R072 Precordial pain: Secondary | ICD-10-CM

## 2010-06-11 DIAGNOSIS — I6529 Occlusion and stenosis of unspecified carotid artery: Secondary | ICD-10-CM

## 2010-06-11 MED ORDER — TECHNETIUM TC 99M TETROFOSMIN IV KIT
11.0000 | PACK | Freq: Once | INTRAVENOUS | Status: AC | PRN
  Administered 2010-06-11: 11 via INTRAVENOUS

## 2010-06-11 MED ORDER — REGADENOSON 0.4 MG/5ML IV SOLN
0.4000 mg | Freq: Once | INTRAVENOUS | Status: AC
  Administered 2010-06-11: 0.4 mg via INTRAVENOUS

## 2010-06-11 MED ORDER — TECHNETIUM TC 99M TETROFOSMIN IV KIT
33.0000 | PACK | Freq: Once | INTRAVENOUS | Status: AC | PRN
  Administered 2010-06-11: 33 via INTRAVENOUS

## 2010-06-11 NOTE — Progress Notes (Signed)
[place] [address] [phone]  Cardiology Nuclear Med Study  [name] is a 60 y.o. female [id number] [birth year]   Nuclear Med Background Indication for Stress Test:  Evaluation for Ischemia History:  '07 MPS:no ischemia, EF=76%, '09 Echo:EF=60% Cardiac Risk Factors: Claudication, Family History - CAD, Hypertension, IDDM Type 2, Lipids, Obesity, IRBBB and Smoker  Symptoms:  Chest Pressure with and without Exertion (last date of chest discomfort was about one week ago) and DOE   Nuclear Pre-Procedure Caffeine/Decaff Intake:  None NPO After: 9:30pm   Lungs:  Clear.  O2 sat 98% on RA. IV 0.9% NS with Angio Cath:  22g  IV Site: R Antecubital  IV Started by:  Kathryn Baltimore, RN  Chest Size (in):  42 Cup Size: C  Height: _0  (1.575 m)  Weight:  173 lb (78.472 kg)  BMI:  Body mass index is 31.64 kg/(m^2). Tech Comments: 6:30am FBS 160    Nuclear Med Study 1 or 2 day study: 1 day  Stress Test Type:  Lexiscan  Reading MD: Kathryn Bickers, MD  Order Authorizing Provider:  Kirk Ruths, MD  Resting Radionuclide: Technetium 83mTetrofosmin  Resting Radionuclide Dose: 11 mCi   Stress Radionuclide:  Technetium 956metrofosmin  Stress Radionuclide Dose: 33 mCi           Stress Protocol Rest HR: 79 Stress HR: 90  Rest BP: 119/53 Stress BP: 134/55  Exercise Time (min): n/a METS: n/a   Predicted Max HR: 159 bpm % Max HR: 56.6 bpm Rate Pressure Product: 12060   Dose of Adenosine (mg):  n/a Dose of Lexiscan: 0.4 mg  Dose of Atropine (mg): n/a Dose of Dobutamine: n/a mcg/kg/min (at max HR)  Stress Test Technologist: [name]  Nuclear Technologist:  [name]     Rest Procedure:  Myocardial perfusion imaging was performed at rest 45 minutes following the intravenous administration of Technetium 9949mtrofosmin. Rest ECG: No acute changes.  Stress Procedure:  The patient  received IV Lexiscan 0.4 mg over 15-seconds.  Technetium 16m23mrofosmin injected at 30-seconds.  There were no significant changes with Lexiscan, rare PVC.  Quantitative spect images were obtained after a 45 minute delay. Stress ECG: No significant change from baseline ECG  QPS Raw Data Images:  Normal; no motion artifact; normal heart/lung ratio. Stress Images:  Decreased uptake in the distal anterior wall and apex. Rest Images:  Decreased uptake in the distal anterior wall and apex. Subtraction (SDS):  Small essentially fixed defect in the distal anterior wall and apex with trivial reversibility. Suspect this is soft tissue attenuation but cannot exclude previous infarct. There is significantly increased uptake in the RV suggested of elevated right sided pressures. Transient Ischemic Dilatation (Normal <1.22):  1.01 Lung/Heart Ratio (Normal <0.45):  .31  Quantitative Gated Spect Images QGS EDV:  63 ml QGS ESV:  12 ml QGS cine images:  NL LV Function; NL Wall Motion QGS EF: 81%  Impression Exercise Capacity:  Lexiscan with no exercise. BP Response:  n/a Clinical Symptoms:  n/a ECG Impression:  No significant ECG changes with Lexiscan. Comparison with Prior Nuclear Study: No significant change from previous study  Overall Impression:  Low risk stress nuclear study.  Small essentially fixed defect in the distal anterior wall and apex with trivial reversibility. Suspect this is soft tissue attenuation but cannot exclude previous infarct. There is significantly increased uptake in the RV suggested of  elevated right sided pressures   [name]   Signed by [name] on 06/11/2010 at 11:43 AM.

## 2010-06-12 ENCOUNTER — Telehealth: Payer: Self-pay | Admitting: Cardiology

## 2010-06-12 NOTE — Telephone Encounter (Addendum)
Spoke with pt, she had a carotid and was told she would need to see a Psychologist, sport and exercise. Pt is questioning what is going on. Wil.l get report, talk with dr Stanford Breed and call pt back. Fredia Beets

## 2010-06-12 NOTE — Telephone Encounter (Signed)
Carotid dopplers reviewed by dr Stanford Breed, pt needs referral to surgeon. Paperwork will be faxed. Pt aware [name]

## 2010-06-12 NOTE — Telephone Encounter (Signed)
Pt had procedure yesterday and was told she had "something to worry about" and would receive a call from [place] yesterday but she hasn't heard from anyone-pt concerned                  pp [phone]

## 2010-06-13 ENCOUNTER — Encounter: Payer: Self-pay | Admitting: Cardiology

## 2010-06-13 NOTE — Telephone Encounter (Signed)
This call was completted by debra mathis--nt

## 2010-06-14 ENCOUNTER — Telehealth: Payer: Self-pay | Admitting: Cardiology

## 2010-06-14 NOTE — Telephone Encounter (Signed)
Pt was suppose to be referred to a surgeon and she has not heard anything and was wondering what was going on she thought this was urgent

## 2010-06-14 NOTE — Telephone Encounter (Signed)
Pt aware [name]

## 2010-06-14 NOTE — Telephone Encounter (Signed)
Per bonnie, pt has appt with dr. Bridgett Larsson on 6/1 @ 9 a.m.

## 2010-06-17 ENCOUNTER — Telehealth: Payer: Self-pay | Admitting: Cardiology

## 2010-06-17 NOTE — Telephone Encounter (Signed)
Pt is not trying to be a pain but she needs to talk to you as soon as you get a chance and you know what it is about

## 2010-06-17 NOTE — Telephone Encounter (Signed)
Spoke with pt, she is concerned because her appt with the vascular surgeons office, regarding her carotid stenosis, is not until 07-12-10. The pt is anxious and feels that the appt is needed sooner than that. She has called the vascular surgeon office and they told her she had nothing to worry about. She wants to let dr Stanford Breed know and find out if he is okay with the appt not being until June. Will forward for dr Stanford Breed review.

## 2010-06-18 NOTE — Telephone Encounter (Signed)
[place] for that appt Kirk Ruths

## 2010-06-18 NOTE — Telephone Encounter (Signed)
PT AWARE OKAY PER DR CRENSHAW TO SEE VASCULAR SURGEONS  ON 07/12/10./CY

## 2010-06-24 ENCOUNTER — Encounter (INDEPENDENT_AMBULATORY_CARE_PROVIDER_SITE_OTHER): Payer: Medicare Other | Admitting: Surgery

## 2010-06-24 DIAGNOSIS — I6529 Occlusion and stenosis of unspecified carotid artery: Secondary | ICD-10-CM

## 2010-06-25 NOTE — Assessment & Plan Note (Signed)
[facility]                            CARDIOLOGY OFFICE NOTE   [name], [name]                     MRN:          [id number]  DATE:12/22/2006                            DOB:          1950-06-30    PRIMARY CARE PHYSICIAN:  Dr. Lucy Chris, [facility].   REASON FOR VISIT:  Follow up mitral valve disease.   HISTORY OF PRESENT ILLNESS:  I saw [name] last November.  In the  interim she underwent a 2-D echocardiogram in February, which  demonstrated normal left ventricular systolic function at 49%.  She was  described as having mild mitral regurgitation and moderate mitral  stenosis, although her calculated valve area by telemetry was in the 1.8  to 2.2 cm/sq range, which would be mild.  She did have a mean trans-  valvular gradient of 12 mmHg.  In any event, she has had no change in  her symptoms of chronic dyspnea, in the setting of pulmonary  hypertension and obstructive sleep apnea.  She also continues to smoke  cigarettes.  She is not reporting any anginal chest pain.  I did refer  her for previous ischemic assessment, and she underwent a Myoview in May  2007, demonstrating soft tissue attenuation without frank ischemia.  Her  electrocardiogram today shows sinus rhythm with a rightward axis and  decreased R-wave progression in the anteroseptal leads.  Axis changes  are old.   ALLERGIES:  MORPHINE AND ULTRAM.   CURRENT MEDICATIONS:  1. Prilosec 20 mg p.o. daily.  2. Hydrochlorothiazide 25 mg p.o. daily.  3. Januvia 100 mg p.o. daily.  4. Metformin ER 500 mg, two tab p.o. twice daily.  5. Glipizide 5 mg, 1/2 tab p.o. twice daily.  6. Cymbalta 30 mg p.o. daily.  7. Lipitor 20 mg p.o. daily.  8. Loratadine 10 mg p.o. daily.  9. Aspirin 81 mg p.o. daily.  10.Spiriva inhaler.  11.Advair inhaler.  12.Skelaxin 800 mg p.o. p.r.n.  13.Xanax 0.25 mg p.o. daily.  14.Vicodin p.r.n.   REVIEW OF  SYSTEMS:  As in the history of present illness.  She has had  no syncope, no hemoptysis.   PHYSICAL EXAMINATION:  VITAL SIGNS:  Blood pressure 108/62, heart rate  88 and regular, weight 180 pounds.  GENERAL:  The patient is in no acute distress.  NECK:  No elevated jugular venous pressure.  No bruits.  LUNGS:  Exhibit diminished breath sounds.  No wheezing.  Breath sounds  are coarse.  HEART:  A regular rate and rhythm.  A soft systolic murmur at the apex.  No diastolic murmur.  No S3 gallop or pericardial rub.  ABDOMEN:  Soft, nontender.  EXTREMITIES:  Exhibit no significant pitting edema.   IMPRESSION/PLAN:  1. A combination of mitral regurgitation and mitral stenosis, both      likely mild, based on her most recent echocardiogram.  These have      also been graded as high as moderate by previous studies.  I would      like to follow up with an echocardiogram in February  2009, and see      the patient back clinically at that point.  More than likely, we      can anticipate yearly followup thereafter, assuming she remains      symptomatically stable.  2. No clear ischemic cardiovascular disease, with a low risk Myoview      in May 2007.  She is not reporting any new symptoms.     Satira Sark, MD  Electronically Signed    SGM/MedQ  DD: 12/22/2006  DT: 12/23/2006  Job #: [id number]   cc:   Lucy Chris, MD

## 2010-06-25 NOTE — Assessment & Plan Note (Signed)
OFFICE VISIT  [name], [name] DOB:  1950-11-18                                       06/24/2010 CHART#:[id number]  REASON FOR VISIT:  Bilateral carotid stenosis, right greater than left.  PRIMARY CARE PHYSICIAN:  Dr. Donnella Bi and Dr. Jerelene Redden.  HISTORY:  This is Horn very pleasant 60 year old female I am seeing at the request Dr. Stanford Breed for evaluation of bilateral carotid stenosis, right greater than left.  The patient had recently had Horn carotid ultrasound performed for Horn right carotid bruit.  This revealed greater than 80% right carotid stenosis, 60% to 80% left carotid stenosis.  The patient denies having any symptoms.  Specifically she denies numbness or weakness in either extremity, no amaurosis fugax, no slurred speech.  The patient has multiple medical problems which she is treated for chronically.  She is Horn diabetic on insulin.  She is on Horn statin for her cholesterol which by her report is well-controlled.  Her blood pressure is also well-controlled on medications.  She is Horn smoker.  She used to be Horn 2 pack Horn day smoker.  She is down to 1 pack Horn day.  REVIEW OF SYSTEMS:  VASCULAR:  Positive for pain in legs when walking and when lying flat. CARDIAC:  Positive for shortness of breath with exertion. GI:  Positive for reflux and hiatal hernia. NEURO:  Positive for dizziness and headaches. PULMONARY:  Positive for productive cough, bronchitis, COPD, wheezing. HEMATOLOGY:  Positive for anemia. MUSCULOSKELETAL:  Positive for joint pain, arthritis and muscle pain. PSYCH:  Positive for depression, anxiety.  PAST MEDICAL HISTORY:  Diabetes, hypertension, hypercholesterolemia, COPD, sleep apnea, fibromyalgia.  PAST SURGICAL HISTORY:  Right clavicle surgery, bilateral tubal ligation and tonsillectomy.  SOCIAL HISTORY:  She is single with 2 children.  Smokes Horn pack Horn day. Does not drink alcohol.  FAMILY HISTORY:  Positive for coronary artery disease in  her father who died of Horn heart attack at age 60 and her brother died of Horn heart attack at age 46.  MEDICATIONS:  Please see medical record.  They include aspirin.  ALLERGIES:  Morphine and tramadol.  PHYSICAL EXAMINATION:  Vital signs:  Heart rate 89, blood pressure 105/68 on the right, 120/68 on the left, respiratory rate 16.  General: She is well-appearing, in no distress.  HEENT:  Within normal limits. Lungs:  Clear bilaterally.  Cardiovascular:  Regular rate and rhythm. Positive right carotid bruit.  Abdomen:  Obese but soft. Musculoskeletal:  No major deformities.  Neuro:  Cranial nerves II to XII grossly intact.  No focal deficits.  Skin:  Without rash.  DIAGNOSTIC STUDIES:  I have reviewed her carotid duplex from the [place] which reveals 80% to 99% right carotid stenosis, 60% to 79% left carotid stenosis.  The bifurcation is in the mid neck.  The plaque extends 1 to 2 cm into the right internal carotid.  ASSESSMENT/PLAN:  Asymptomatic right carotid stenosis with moderate left carotid stenosis.  PLAN:  I discussed the risks and benefits of operation including lowering her risk for stroke.  We discussed the risks of perioperative complications including nerve injury, stroke.  All of her questions were answered today.  She wishes to get this done as soon as possible.  I have scheduled her operation for Thursday, May 24.    Eldridge Abrahams, MD Electronically Signed  VWB/MEDQ  D:  06/24/2010  T:  06/25/2010  Job:  [id number]  cc:   Denice Bors. Stanford Breed, MD, [facility] Trinidad Curet, MD Jerelene Redden, M.D.

## 2010-06-25 NOTE — Assessment & Plan Note (Signed)
[facility]                            CARDIOLOGY OFFICE NOTE   Horn, Kathryn                     MRN:          [id number]  DATE:06/01/2007                            DOB:          04-Feb-1951    PRIMARY CARE PHYSICIAN:  Lucy Chris, MD   REASON FOR VISIT:  Routine follow-up of mitral valve disease.   HISTORY OF PRESENT ILLNESS:  [name] comes back in for a 13-month visit.  She had no change in symptoms.  She denies any significant  anginal chest pain.  She has had no significant change in breathing  pattern, presently NYHA class II dyspnea on exertion.  Her  electrocardiogram shows sinus rhythm with a small R-prime in lead V1 and  V2 and nonspecific ST changes.  She had an echocardiogram done in  followup back in March, which shows a left rib ventricular ejection  fraction of 60% without wall motion abnormalities.  She has evidence of  both mild mitral regurgitation and no major degree of mitral stenosis  beyond that of mild degree.  Her valve area was calculated at 2.56 cm2.  She has mild-to-moderate tricuspid regurgitation and pulmonary  hypertension with a PA systolic pressure of 55 mmHg.  This is known.  Given the stability of her echocardiogram, I doubt that we would need to  proceed any additional cardiac testing at this time.  I asked her to  continue to follow up Dr. KSelinda Flavinand be considered for a follow-up  echocardiogram on a 2-year basis, presuming her symptoms did not  progress in the meanwhile.   ALLERGIES:  MORPHINE AND ULTRAM.   MEDICATIONS:  1. Prilosec OTC 20 mg p.o. daily.  2. Hydrochlorothiazide 25 mg p.o.q.d.  3. Metformin ER 500 mg 4 tablets daily.  4. Cymbalta 30 mg 3 tablets p.o. daily.  5. Loratadine 10 mg p.o. q.d.  6. Aspirin 1 mg p.o.q.d.  7. Spiriva 18 mcg daily.  8. Advair 250/50 b.i.d.  9. Glipizide 10 mg p.o. b.i.d.  10.Crestor 20 mg p.o. q.h.s.  11.Alprazolam 0.5 mg p.o. q.h.s.  12.Ferrous  sulfate as directed.   REVIEW OF SYSTEMS:  As described in the history of present illness.  She  states she has some problems with anemia, requiring packed red cell  transfusion and has undergone evaluation by a gastroenterologist at this  time.  Otherwise systems negative.   EXAMINATION:  VITAL SIGNS:  Blood pressure 117/76, heart rate is 90 and  regular, weight 179 pounds which is overall stable.  GENERAL:  The patient is comfortable in no acute distress.  NECK:  Reveals no elevated jugular venous pressure.  LUNGS:  Clear with diminished breath sounds.  CARDIAC:  Exam reveals a regular rate and rhythm, soft systolic murmur.  No diastolic murmur or pericardial rub.  No S3 gallop.  EXTREMITIES:  Exhibit no frank pitting edema.   IMPRESSION AND RECOMMENDATIONS:  Mild mitral regurgitation and mild  mitral stenosis, with overall normal ejection fraction.  This is on a  baseline of moderate pulmonary hypertension with known obstructive sleep  apnea and history of tobacco  use.  Previous ischemic evaluation was  reassuring via Myoview, and the patient is not describing any  progressive symptomatology.  Based on this, would recommend general  cardiac risk factor modification strategies and would consider a follow-  up echocardiogram over a 2-year cycle, assuming her symptoms remains  stable.  Her followup with [place] will be p.r.n., and she will continue see  Dr. Selinda Flavin on regular basis.     Satira Sark, MD  Electronically Signed    SGM/MedQ  DD: 06/01/2007  DT: 06/01/2007  Job #: [id number]   cc:   Lucy Chris, MD

## 2010-06-25 NOTE — Op Note (Signed)
NAMEHAIDE, KLINKER              ACCOUNT NO.:  [id number]   MEDICAL RECORD NO.:  [id number]          PATIENT TYPE:  AMB   LOCATION:  ENDO                         FACILITY:  [facility]   PHYSICIAN:  John C. Amedeo Plenty, M.D.    DATE OF BIRTH:  1950/07/07   DATE OF PROCEDURE:  08/31/2006  DATE OF DISCHARGE:  08/31/2006                               OPERATIVE REPORT   SMALL BOWEL CAPSULE ENDOSCOPY.   INDICATION FOR PROCEDURE:  1. Diarrhea.  2. Sweating and flushing episodes.  3. An elevated urinary 5-HIAA level, raising the question of carcinoid      syndrome.   PROCEDURAL DATA:  1. Gastric passage time 23 minutes.  2. Small bowel passage time 5 hours and 4 minutes.   RESULT:  1. Normal small intestine.  2. No visible suspicion of neoplasm.   IMPRESSION:  Normal study.   PLAN:  Consider chest x-ray or CT scan to assess for possible bronchial  carcinoid tumor.           ______________________________  Elyse Jarvis. Amedeo Plenty, M.D.     JCH/MEDQ  D:  09/13/2006  T:  09/13/2006  Job:  [id number]

## 2010-06-26 ENCOUNTER — Other Ambulatory Visit: Payer: Self-pay | Admitting: *Deleted

## 2010-06-26 NOTE — Telephone Encounter (Signed)
Pharmacy is requesting refill for 6 months.

## 2010-06-27 MED ORDER — BUPROPION HCL ER (XL) 150 MG PO TB24: 150.0000 mg | ORAL_TABLET | Freq: Every day | ORAL | Status: DC

## 2010-06-27 MED ORDER — ALPRAZOLAM 1 MG PO TABS: ORAL_TABLET | ORAL | Status: DC

## 2010-06-27 MED ORDER — HYDROCODONE-ACETAMINOPHEN 10-325 MG PO TABS: ORAL_TABLET | ORAL | Status: DC

## 2010-06-28 NOTE — Op Note (Signed)
[facility]  Patient:    [name], [name] Visit Number: [id number] MRN: [id number]          Service Type: NES Location: [place] Attending Physician:  Richarda Blade Dictated by:   Chauncey Cruel. [name], M.D. Proc. Date: 06/17/01 Admit Date:  06/17/2001                             Operative Report  PREOPERATIVE DIAGNOSIS:  Persistent postmenopausal bleeding.  POSTOPERATIVE DIAGNOSIS:  Persistent postmenopausal bleeding.  OPERATION:  Hysteroscopy with endometrial ablation.  SURGEON:  Selinda Orion, M.D.  ANESTHESIA:  _______  DESCRIPTION OF PROCEDURE:  The patient was placed in lithotomy position, prepped and draped in the usual fashion. Cervix was very carefully dilated and the hysteroscope was inserted into the uterus. Visualization of the endometrial cavity revealed endometrial cavity to be quite smooth and I did a systematic endometrial ablation with a VaporTrode at 200 watts of power. No unusual blood loss occurred. No fluid deficit occurred. The patient was awakened and carried to recovery room in good condition. Dictated by:   S. [name], M.D. Attending Physician:  Richarda Blade DD:  06/17/01 TD:  06/18/01 Job: [id number]

## 2010-06-28 NOTE — Op Note (Signed)
NAMEBRYANA, FROEMMING              ACCOUNT NO.:  [id number]   MEDICAL RECORD NO.:  [id number]          PATIENT TYPE:  OIB   LOCATION:  NA                           FACILITY:  [facility]   PHYSICIAN:  Mark C. [name], M.D.    DATE OF BIRTH:  02/25/1950   DATE OF PROCEDURE:  02/07/2004  DATE OF DISCHARGE:                                 OPERATIVE REPORT   PREOPERATIVE DIAGNOSIS:  Right clavicle nonunion.   POSTOPERATIVE DIAGNOSIS:  Right clavicle nonunion.   OPERATION PERFORMED:  Take down nonunion, right clavicle, compression plate  fixation.   SURGEON:  Mark C. [name], M.D.   ASSISTANT:  Trinda Pascal, RN, FA   ESTIMATED BLOOD LOSS:  Less than 100 mL.   ANESTHESIA:  General plus Marcaine 20 mL local.   DESCRIPTION OF PROCEDURE:  After induction of general anesthesia,  preoperative Ancef, the patient was placed in beach chair position with  DuraPrep.  The usual towels were used to square out the area.  Split sheets  and impervious stockinette and Coban and Betadine Vi-drape over sterile skin  marker.  Incision was made following the normal course of the clavicle.  The  clavicle was identified proximally and distally. There was overlap, obvious  nonunion with motion, however, there was overlap and the bones were stuck  down in a scarred bed.  There was no evidence of bone formation.  Fibrous  tissue was removed.  Due to the overlap, the bone was shortened on the  proximal end by about 3 to 4 mm.  The coracoclavicular ligaments were  spared.  A Recon Synthes locking plate was then custom bent to fit across  the anterior aspect of the clavicle. Locking screws were placed distally  times three and then proximal locking screws were placed times two with one  single nonlocking bicortical screw adjacent to the fracture site.  Some  edges of the bone that had been shortened were packed in around the fracture  site for some local bone graft.  The periosteal sleeve was closed with 2-0  Vicryl and  2-0 in the subcutaneous tissue, 3-0 Prolene subcuticular skin  closure, tincture of benzoin and Steri-Strips, 4 x 4s and dressing.  Instrument count, needle count was correct.       MCY/MEDQ  D:  02/07/2004  T:  02/07/2004  Job:  [id number]

## 2010-06-28 NOTE — Procedures (Signed)
[place]  Patient:    [name], [name]                       MRN: [id number] Proc. Date: 06/12/00 Attending:  Elyse Jarvis. Amedeo Plenty, M.D. CC:         Landry Corporal, M.D.   Procedure Report  PROCEDURE:  Esophagogastroduodenoscopy with biopsy.  INDICATION FOR PROCEDURE:  Refractory gastroesophageal reflux symptoms with significant component of shortness of breath and fullness, possibly comprising an anxiety component, but without significant response to double-dose Nexium.  DESCRIPTION OF PROCEDURE:  The patient was placed in the left lateral decubitus position and placed on the pulse monitor with continuous low-flow oxygen delivered by nasal cannula.  She was sedated with 50 mg IV Demerol and 5 mg IV Versed.  The Olympus video endoscope was advanced under direct vision into the oropharynx and the esophagus.  The esophagus was straight and of normal caliber with the squamocolumnar line at 36 cm above an approximately 1.5 cm broad hiatal hernia.  The GE junction appeared somewhat patulous, and reflux was seen during the procedure.  There were no visible erosions or other visible evidence of esophagitis, and there was no ring or stricture appreciated.  The stomach was entered, and a small amount of liquid secretions was suctioned from the fundus.  Retroflex view of the cardia revealed a very nodular pattern of apparent gastritis with thickening and cobblestone appearance to the majority of the mucosa in the fundus, fading toward more normal mucosa in the body.  Biopsies were taken to rule out an infiltrating neoplasm.  There was gradual transition to relatively normal-looking mucosa in the body and proximal antrum, but in the distal antrum there were seen a few erythematous erosions with no exudate, consistent with typical-appearing antral gastritis.  A CLOtest was obtained.  The duodenum was entered and base of tongue the bulb and second portion were  well-inspected and appeared to be within normal limits.  The endoscope was then withdrawn, and the patient returned to the recovery room in stable condition.  She tolerated the procedure well, and there were no immediate complications.  IMPRESSION: 1. Proximal nodular gastritis. 2. Hiatal hernia with patulous gastroesophageal junction. 3. Antral gastritis.  PLAN:  Await CLOtest and biopsies.  Will continue double-dose Nexium for now. DD:  06/12/00 TD:  06/15/00 Job: [id number]

## 2010-06-28 NOTE — Op Note (Signed)
   NAME:  [name], [name]                        ACCOUNT NO.:  [id number]   MEDICAL RECORD NO.:  [id number]                   PATIENT TYPE:  AMB   LOCATION:  ENDO                                 FACILITY:  [facility]   PHYSICIAN:  Waverly Ferrari, M.D.                 DATE OF BIRTH:  [birth year]   DATE OF PROCEDURE:  02/21/2002  DATE OF DISCHARGE:                                 OPERATIVE REPORT   PROCEDURE:  Colonoscopy.   INDICATIONS:  Colon polyps, colon cancer screening.   ANESTHESIA:  Demerol 100 mg, Versed 10 mg.   DESCRIPTION OF PROCEDURE:  With the patient mildly sedated in the left  lateral decubitus position,  the Olympus videoscopic colonoscope was  inserted into the rectum and passed under direct vision to the cecum,  identified by ileocecal valve and appendiceal orifice, both of which were  photographed.  In the cecum was a small polyp, which we removed using hot  biopsy forceps technique, setting of 3 and 3 blended current.  From this  point the colonoscope was slowly withdrawn, taking circumferential views of  the entire colonic mucosa, stopping only at approximately 20 cm from the  anal verge, at which point a second polyp was seen, photographed, and  removed using hot biopsy forceps technique, again a setting of 3 and 3  blended current.  In the rectum the endoscope was placed in retroflexion to  view the anal canal from above.  Internal hemorrhoids were seen and  photographed.  The endoscope was straightened and withdrawn.  The patient's  vital signs and pulse oximetry remained stable.  The patient tolerated the  procedure well without apparent complications.   FINDINGS:  1. Two small polyps as described above, one in the rectosigmoid, the other     in the cecum.  Await biopsy report.  The patient will call me for results     and follow up with me as an outpatient.  2. Internal hemorrhoids.                                               Waverly Ferrari, M.D.    GMO/MEDQ  D:  02/21/2002  T:  02/21/2002  Job:  [id number]

## 2010-06-28 NOTE — Assessment & Plan Note (Signed)
[facility]                              CARDIOLOGY OFFICE NOTE   [name], [name]                     MRN:          [id number]  DATE:12/19/2005                            DOB:          1950/02/18    PRIMARY CARE PHYSICIAN:  [name], M.D.   REASON FOR VISIT:  Followup mitral valve disease.   HISTORY OF PRESENT ILLNESS:  I saw [name] back in May.  Her history is  outlined in my previous note and includes moderate mitral regurgitation as  well as right ventricular dilatation and pulmonary hypertension, also in the  setting of chronic lung disease and obstructive sleep apnea.  Symptomatically, [name] has been stable without any progressive dyspnea  and no significant chest discomfort.  She tells me that she is no longer  using her CPAP and has not seen [name] back for some time.  I have  reviewed her medications today which are outlined below.  Her  electrocardiogram shows a sinus rhythm with a rightward axis but no marked  changes in comparison to the prior tracing from May.  Since the last visit  she did followup with a myocardial perfusion study which was overall low  risk showing some mild thinning in the distal anterior and apical walls,  although what was described as probable normal perfusion with soft tissue  attenuation and no ischemia.  Ejection fraction was 70%.  She will be due  for a followup surface echocardiogram in February 2008.   ALLERGIES:  1. MORPHINE.  2. [name].   PRESENT MEDICATIONS:  1. Prilosec 40 mg p.o. every day.  2. Hydrochlorothiazide 25 mg p.o. every day.  3. Janusian 100 mg p.o. every day.  4. Metformin 500 mg, one p.o. b.i.d.  5. Lexapro 20 mg p.o. every day.  6. Lipitor 20 mg p.o. every day.  7. Loratadine 10 mg p.o. every day.  8. Aspirin 81 mg p.o. every day.  9. Spiriva 18 mcg once daily.  10.Advair 250/50, one puff b.i.d.  11.Skelaxin 800 mg p.r.n.  12.Advil p.r.n.  13.Alprazolam 0.25 mg p.r.n.   REVIEW OF SYSTEMS:  As described in the history of present illness.   PHYSICAL EXAMINATION:  VITAL SIGNS:  Blood pressure 142/70, heart rate 82.  GENERAL:  The patient is comfortable and in no acute distress.  HEENT:  Conjunctivae and lids normal.  Oropharynx is clear.  NECK:  Supple without elevated jugular venous pressure or loud bruits.  LUNGS:  Exhibit diminished breath sounds, somewhat coarse, no wheezing or  labored breathing.  CARDIAC:  Reveals a regular rate and rhythm with a 2/6 systolic murmur  predominantly at the apex.  No S3 gallop noted.  ABDOMEN:  Soft.  No bruits.  No hepatomegaly.  EXTREMITIES:  Show trace edema below the knees bilaterally.   IMPRESSION/RECOMMENDATIONS:  1. Moderate mitral regurgitation with normal ejection fraction.  Plan will      be a followup surface echocardiogram in February 2008.  If this is      stable, I would anticipate yearly followup with symptom review and  repeat echocardiograms.  2. Low risk myocardial perfusion study from May 2007.  Would not      anticipate any additional ischemic evaluation at this point.  3. Obstructive sleep apnea, presently not on CPAP.  I have recommended      that she discuss this with [name] regarding referral back to      pulmonary medicine.  4. Hypertension noted on today's visit.  At this point, she is on      hydrochlorothiazide only.  It might be worth considering a combination      medicine such as an ACE inhibitor or angiotensin receptor blocker plus      hydrochlorothiazide.  She will have her blood pressure checked in      followup again with [name].     Satira Sark, MD  Electronically Signed    SGM/MedQ  DD: 12/19/2005  DT: 12/19/2005  Job #: [id number]   cc:   [name], M.D.

## 2010-06-28 NOTE — Procedures (Signed)
[name], [name]              ACCOUNT NO.:  [id number]   MEDICAL RECORD NO.:  [id number]          PATIENT TYPE:  OUT   LOCATION:  SLEEP CENTER                 FACILITY:  [facility]   PHYSICIAN:  Clinton D. Annamaria Boots, M.D. DATE OF BIRTH:  Jul 14, 1950   DATE OF STUDY:                              NOCTURNAL POLYSOMNOGRAM   REFERRING PHYSICIAN:  Dr. Baxter Kail.   INDICATIONS FOR STUDY:  Hypersomnia with sleep apnea.  Epworth sleepiness  score 15/24, BMI 33.  Weight 183 pounds.  The patient describes very  animated sleep including having a fractured clavicle in 2005 from falling  out of bed.  A diagnostic MPSG on 01/18/2003 and [place] had reported  an AHI of 8.6 per hour and she has been wearing CPAP.  She is scheduled for  CPAP titration.   SLEEP ARCHITECTURE:  Total sleep time 280 minutes with sleep efficiency 73%.  Stage I was 3%, stage II 57%, stages III and IV absent, REM 13% of total  sleep time.  Sleep latency 67 minutes, REM latency 157 minutes, awake after  sleep onset 35 minutes, arousal index 28.38.  Xanax 0.25 mg, HCTZ, Spiriva,  and loratadine 10 mg were taken at h.s.   RESPIRATORY DATA:  CPAP titration protocol.  CPAP was titrated to 220 CWP.  At a pressure range of 12 CWP AHI was 0, but technician did not hold this  pressure apparently because of snoring.  At 20 CWP AHI was 0 per hour for 20  minutes.  A medium Respironics Comfort full face mask was used with heated  humidifier.  The patient slept supine only.   OXYGEN DATA:  Snoring was prevented at final CPAP and oxygen saturation on  CPAP held 96-98% on room air.   CARDIAC DATA:  Normal sinus rhythm.   MOVEMENT/PARASOMNIA:  A total of 129 limb jerks were reported of which 15  were associated with arousal or awakening for periodic limb movement with  arousal index of 3.2 per hour which is mildly increased.  More complex  behaviors suggesting REM behavior disorder or seizure activity were not  seen.   IMPRESSION/RECOMMENDATIONS:  1.  CPAP titration to 20 mL CWP, AHI 0 per hour using a medium Respironics      Comfort full face mask with heated humidifier.  2.  Original diagnostic MPSG reported an AHI of 8.6 per hour on 01/18/2003.  3.  Mild periodic limb movement with arousal, 3.2 per hour.      Clinton D. Annamaria Boots, M.D.  Diplomate, Tax adviser of Sleep Medicine  Electronically Signed     CDY/MEDQ  D:  05/18/2005 12:16:48  T:  05/19/2005 13:24:40  Job:  [id number]

## 2010-06-28 NOTE — Op Note (Signed)
[name], [name] NO.:  [id number]   MEDICAL RECORD NO.:  [id number]          PATIENT TYPE:  AMB   LOCATION:  DSC                          FACILITY:  [facility]   PHYSICIAN:  Shellia Carwin, M.D. DATE OF BIRTH:  1950-05-01   DATE OF PROCEDURE:  09/08/2005  DATE OF DISCHARGE:                                 OPERATIVE REPORT   OPERATIVE PROCEDURE:  Excision of occipital mass - 5 cm lipoma.   SURGEON:  Dr. [name].   ANESTHESIA:  1% Xylocaine with epinephrine - 30 mL.   PREOP DIAGNOSIS:  Occipital lipoma.   POSTOPERATIVE DIAGNOSIS:  Occipital lipoma.   OPERATIVE PROCEDURE:  The patient was positioned, prepped and draped in the  standard fashion.  The above local was infiltrated for good analgesia.  Transverse incision made and carried down through the deep subcu until I got  to the capsule of the lipoma.  Then self-retaining retractors placed and  scalpel dissection used to sharply dissect the lesion from the surrounding  subcu and the underlying fascia without injuring either.  Then the wound was  closed with interrupted widely placed 3-0 nylon mattress sutures and  interrupted 3-0 suture.  Sterile dressing applied.  Discharged home in good  condition.           ______________________________  Shellia Carwin, M.D.     MRL/MEDQ  D:  09/08/2005  T:  09/08/2005  Job:  [id number]

## 2010-07-02 ENCOUNTER — Other Ambulatory Visit: Payer: Self-pay | Admitting: Dietician

## 2010-07-02 ENCOUNTER — Other Ambulatory Visit: Payer: Self-pay | Admitting: Surgery

## 2010-07-02 ENCOUNTER — Ambulatory Visit (HOSPITAL_COMMUNITY)
Admission: RE | Admit: 2010-07-02 | Discharge: 2010-07-02 | Disposition: A | Discharge status: Home / Self Care | Payer: Medicare Other | Source: Ambulatory Visit | Attending: Surgery | Admitting: Surgery

## 2010-07-02 ENCOUNTER — Encounter (HOSPITAL_COMMUNITY)
Admission: RE | Admit: 2010-07-02 | Discharge: 2010-07-02 | Disposition: A | Discharge status: Home / Self Care | Payer: Medicare Other | Source: Ambulatory Visit | Attending: Surgery | Admitting: Surgery

## 2010-07-02 DIAGNOSIS — Z01811 Encounter for preprocedural respiratory examination: Secondary | ICD-10-CM | POA: Insufficient documentation

## 2010-07-02 DIAGNOSIS — I6529 Occlusion and stenosis of unspecified carotid artery: Secondary | ICD-10-CM

## 2010-07-02 DIAGNOSIS — Z01812 Encounter for preprocedural laboratory examination: Secondary | ICD-10-CM | POA: Insufficient documentation

## 2010-07-02 DIAGNOSIS — F17200 Nicotine dependence, unspecified, uncomplicated: Secondary | ICD-10-CM | POA: Insufficient documentation

## 2010-07-02 DIAGNOSIS — I10 Essential (primary) hypertension: Secondary | ICD-10-CM | POA: Insufficient documentation

## 2010-07-02 DIAGNOSIS — E119 Type 2 diabetes mellitus without complications: Secondary | ICD-10-CM | POA: Insufficient documentation

## 2010-07-02 LAB — PROTIME-INR
INR: 1.05 (ref 0.00–1.49)
Prothrombin Time: 13.9 seconds (ref 11.6–15.2)

## 2010-07-02 LAB — URINALYSIS, ROUTINE W REFLEX MICROSCOPIC
Bilirubin Urine: NEGATIVE
Glucose, UA: >1000 mg/dL — AB
Hgb urine dipstick: NEGATIVE
Ketones, ur: NEGATIVE mg/dL
Leukocytes, UA: NEGATIVE
Nitrite: NEGATIVE
Protein, ur: NEGATIVE mg/dL
Specific Gravity, Urine: 1.023 (ref 1.005–1.030)
Urobilinogen, UA: 1 mg/dL (ref 0.0–1.0)
pH: 6 (ref 5.0–8.0)

## 2010-07-02 LAB — COMPREHENSIVE METABOLIC PANEL
ALT: 10 U/L (ref 0–35)
AST: 11 U/L (ref 0–37)
Albumin: 3.7 g/dL (ref 3.5–5.2)
Alkaline Phosphatase: 54 U/L (ref 39–117)
BUN: 13 mg/dL (ref 6–23)
CO2: 28 mEq/L (ref 19–32)
Calcium: 9.6 mg/dL (ref 8.4–10.5)
Chloride: 95 mEq/L — ABNORMAL LOW (ref 96–112)
Creatinine, Ser: 0.93 mg/dL (ref 0.4–1.2)
GFR calc Af Amer: >60 mL/min (ref >60)
GFR calc non Af Amer: >60 mL/min (ref >60)
Glucose, Bld: 288 mg/dL — ABNORMAL HIGH (ref 70–99)
Potassium: 4.2 mEq/L (ref 3.5–5.1)
Sodium: 135 mEq/L (ref 135–145)
Total Bilirubin: 0.2 mg/dL — ABNORMAL LOW (ref 0.3–1.2)
Total Protein: 6.7 g/dL (ref 6.0–8.3)

## 2010-07-02 LAB — CBC
HCT: 29.9 % — ABNORMAL LOW (ref 36.0–46.0)
Hemoglobin: 9.6 g/dL — ABNORMAL LOW (ref 12.0–15.0)
MCH: 26.4 pg (ref 26.0–34.0)
MCHC: 32.1 g/dL (ref 30.0–36.0)
MCV: 82.1 fL (ref 78.0–100.0)
Platelets: 241 10*3/uL (ref 150–400)
RBC: 3.64 MIL/uL — ABNORMAL LOW (ref 3.87–5.11)
RDW: 15.2 % (ref 11.5–15.5)
WBC: 9.1 10*3/uL (ref 4.0–10.5)

## 2010-07-02 LAB — URINE MICROSCOPIC-ADD ON

## 2010-07-02 LAB — SURGICAL PCR SCREEN
MRSA, PCR: NEGATIVE
Staphylococcus aureus: NEGATIVE

## 2010-07-02 LAB — APTT: aPTT: 28 seconds (ref 24–37)

## 2010-07-04 ENCOUNTER — Inpatient Hospital Stay (HOSPITAL_COMMUNITY)
Admission: RE | Admit: 2010-07-04 | Discharge: 2010-07-07 | DRG: 038 | Disposition: A | Discharge status: Home / Self Care | Payer: Medicare Other | Source: Ambulatory Visit | Attending: Surgery | Admitting: Surgery

## 2010-07-04 ENCOUNTER — Other Ambulatory Visit: Payer: Self-pay | Admitting: Surgery

## 2010-07-04 DIAGNOSIS — K219 Gastro-esophageal reflux disease without esophagitis: Secondary | ICD-10-CM | POA: Diagnosis present

## 2010-07-04 DIAGNOSIS — Z7982 Long term (current) use of aspirin: Secondary | ICD-10-CM

## 2010-07-04 DIAGNOSIS — I998 Other disorder of circulatory system: Secondary | ICD-10-CM | POA: Diagnosis present

## 2010-07-04 DIAGNOSIS — I6529 Occlusion and stenosis of unspecified carotid artery: Secondary | ICD-10-CM

## 2010-07-04 DIAGNOSIS — G4733 Obstructive sleep apnea (adult) (pediatric): Secondary | ICD-10-CM | POA: Diagnosis present

## 2010-07-04 DIAGNOSIS — R1310 Dysphagia, unspecified: Secondary | ICD-10-CM | POA: Diagnosis not present

## 2010-07-04 DIAGNOSIS — D62 Acute posthemorrhagic anemia: Secondary | ICD-10-CM | POA: Diagnosis not present

## 2010-07-04 HISTORY — PX: CAROTID ENDARTERECTOMY: SUR193

## 2010-07-04 LAB — GLUCOSE, CAPILLARY
Glucose-Capillary: 253 mg/dL — ABNORMAL HIGH (ref 70–99)
Glucose-Capillary: 171 mg/dL — ABNORMAL HIGH (ref 70–99)
Glucose-Capillary: 205 mg/dL — ABNORMAL HIGH (ref 70–99)
Glucose-Capillary: 191 mg/dL — ABNORMAL HIGH (ref 70–99)
Glucose-Capillary: 204 mg/dL — ABNORMAL HIGH (ref 70–99)

## 2010-07-05 LAB — PREPARE RBC (CROSSMATCH)

## 2010-07-05 LAB — GLUCOSE, CAPILLARY
Glucose-Capillary: 210 mg/dL — ABNORMAL HIGH (ref 70–99)
Glucose-Capillary: 99 mg/dL (ref 70–99)
Glucose-Capillary: 135 mg/dL — ABNORMAL HIGH (ref 70–99)
Glucose-Capillary: 134 mg/dL — ABNORMAL HIGH (ref 70–99)

## 2010-07-05 LAB — BASIC METABOLIC PANEL
BUN: 12 mg/dL (ref 6–23)
CO2: 26 mEq/L (ref 19–32)
Calcium: 8.7 mg/dL (ref 8.4–10.5)
Chloride: 102 mEq/L (ref 96–112)
Creatinine, Ser: 0.79 mg/dL (ref 0.4–1.2)
GFR calc Af Amer: >60 mL/min (ref >60)
GFR calc non Af Amer: >60 mL/min (ref >60)
Glucose, Bld: 151 mg/dL — ABNORMAL HIGH (ref 70–99)
Potassium: 3.9 mEq/L (ref 3.5–5.1)
Sodium: 135 mEq/L (ref 135–145)

## 2010-07-05 LAB — CBC
HCT: 25.1 % — ABNORMAL LOW (ref 36.0–46.0)
Hemoglobin: 8.1 g/dL — ABNORMAL LOW (ref 12.0–15.0)
MCH: 26.8 pg (ref 26.0–34.0)
MCHC: 32.3 g/dL (ref 30.0–36.0)
MCV: 83.1 fL (ref 78.0–100.0)
Platelets: 190 10*3/uL (ref 150–400)
RBC: 3.02 MIL/uL — ABNORMAL LOW (ref 3.87–5.11)
RDW: 15.6 % — ABNORMAL HIGH (ref 11.5–15.5)
WBC: 9.3 10*3/uL (ref 4.0–10.5)

## 2010-07-05 LAB — HEMOGLOBIN A1C
Hgb A1c MFr Bld: 9.3 % — ABNORMAL HIGH (ref <5.7)
Mean Plasma Glucose: 220 mg/dL — ABNORMAL HIGH (ref <117)

## 2010-07-05 NOTE — Telephone Encounter (Signed)
Faxed to [facility].

## 2010-07-06 ENCOUNTER — Inpatient Hospital Stay (HOSPITAL_COMMUNITY): Payer: Medicare Other

## 2010-07-06 LAB — GLUCOSE, CAPILLARY
Glucose-Capillary: 142 mg/dL — ABNORMAL HIGH (ref 70–99)
Glucose-Capillary: 169 mg/dL — ABNORMAL HIGH (ref 70–99)
Glucose-Capillary: 189 mg/dL — ABNORMAL HIGH (ref 70–99)
Glucose-Capillary: 247 mg/dL — ABNORMAL HIGH (ref 70–99)

## 2010-07-06 LAB — CBC
HCT: 32.2 % — ABNORMAL LOW (ref 36.0–46.0)
Hemoglobin: 10.4 g/dL — ABNORMAL LOW (ref 12.0–15.0)
MCH: 27 pg (ref 26.0–34.0)
MCHC: 32.3 g/dL (ref 30.0–36.0)
MCV: 83.6 fL (ref 78.0–100.0)
Platelets: 167 10*3/uL (ref 150–400)
RBC: 3.85 MIL/uL — ABNORMAL LOW (ref 3.87–5.11)
RDW: 15.3 % (ref 11.5–15.5)
WBC: 7.1 10*3/uL (ref 4.0–10.5)

## 2010-07-06 LAB — TYPE AND SCREEN
ABO/RH(D): A NEG
Antibody Screen: NEGATIVE
Unit division: 0
Unit division: 0

## 2010-07-07 LAB — GLUCOSE, CAPILLARY: Glucose-Capillary: 211 mg/dL — ABNORMAL HIGH (ref 70–99)

## 2010-07-09 NOTE — Discharge Summary (Addendum)
[name], STACHNIK              ACCOUNT NO.:  [id number]  MEDICAL RECORD NO.:  [id number]           PATIENT TYPE:  I  LOCATION:  2036                         FACILITY:  [facility]  PHYSICIAN:  Theotis Burrow IV, MDDATE OF BIRTH:  25-Nov-1950  DATE OF ADMISSION:  07/04/2010 DATE OF DISCHARGE:  07/07/2010                              DISCHARGE SUMMARY   CHIEF COMPLAINT:  Asymptomatic right carotid stenosis.  HISTORY OF PRESENT ILLNESS:  Ms. Steffensmeier is a 60 year old female who was found to have a carotid bruit which was followed up with duplex scan and showed a high-grade right carotid stenosis of greater than 80%.  She was asymptomatic.  She denies symptoms of TIA, stroke, paresthesias, or amaurosis fugax.  She was admitted for an elective operative repair of critical right carotid stenosis.  PAST MEDICAL HISTORY: 1. Diabetic, on insulin. 2. Hypercholesterolemia. 3. Hypertension. 4. Tobacco abuse. 5. COPD and sleep apnea. 6. Fibromyalgia.  HOSPITAL COURSE:  The patient was taken to the operating room on Jul 04, 2010, for a right carotid endarterectomy with Bovine patch angioplasty. Postoperatively, the patient did very well except she had some difficulty swallowing due to swelling in her neck.  She failed a swallow study initially on the first postop day, but had a modified barium swallow which was negative.  She was able to take p.o. regular diet by the second postoperative day.  Her soreness in her throat was improving over time.  She was ambulating, voiding, and taking p.o.  Her hemoglobin and hematocrit were 10.4 and 32.2.  Neurologically, she remained intact. She had no tongue deviation, no facial droop.  She had good and equal strength in bilateral upper and lower extremities.  She had some anemia postop which was treated with transfusion and postop labs as listed above.  She will be discharged to home on Jul 07, 2010.  FINAL DIAGNOSES: 1. Asymptomatic critical right  carotid stenosis status post right     carotid endarterectomy. 2. Mild postop dysphagia which resolved spontaneously. 3. Postop anemia with a small amount of ecchymosis at the side of the     wound with no hematoma and improved after blood transfusion.  The     rest of her medical conditions were stable and treated with her     preoperative medications.  DISCHARGE MEDICATIONS: 1. Norco 10/325 one tablet every 6 hours as needed for pain. 2. Advair Diskus one puff twice daily. 3. Albuterol inhaler 2 puffs every 4 hours as needed. 4. Alprazolam 1 mg twice daily as needed for anxiety. 5. Aspirin 81 mg daily. 6. Bee Pollen daily. 7. Crestor 20 mg daily. 8. Cymbalta 30 mg daily. 9. Dexilant 60 mg daily. 10.Colace 100 mg twice daily. 11.Lisinopril and hydrochlorothiazide 10/12.5 daily. 12.Loratadine 10 mg daily. 13.Lantus 42 units every evening. 14.NovoLog insulin 3 units before breakfast as needed. 15.Nystatin topical solution as needed. 16.Promethazine 12.5 mg three times a day as needed for nausea. 17.Spiriva 18 mcg daily. 18.Wellbutrin XL 150 mg daily.     Wray Kearns, PA-C   ______________________________ Clayton Bibles. Leia Alf, MD    RR/MEDQ  D:  07/07/2010  T:  07/07/2010  Job:  [id number]  Electronically Signed by Wray Kearns PA on 07/09/2010 12:14:35 PM Electronically Signed by Orvan Falconer IV MD on 07/17/2010 01:28:29 PM

## 2010-07-11 ENCOUNTER — Encounter (HOSPITAL_COMMUNITY): Payer: Self-pay | Admitting: Cardiology

## 2010-07-12 ENCOUNTER — Encounter: Payer: Medicare Other | Admitting: Vascular Surgery

## 2010-07-15 ENCOUNTER — Ambulatory Visit: Payer: Medicare Other | Admitting: Internal Medicine

## 2010-07-15 ENCOUNTER — Ambulatory Visit: Payer: Medicare Other | Admitting: Dietician

## 2010-07-17 NOTE — Op Note (Signed)
NAMEROSELLEN, LICHTENBERGER              ACCOUNT NO.:  [id number]  MEDICAL RECORD NO.:  [id number]           PATIENT TYPE:  I  LOCATION:  8563                         FACILITY:  [facility]  PHYSICIAN:  Theotis Burrow IV, MDDATE OF BIRTH:  1950/04/18  DATE OF PROCEDURE:  07/04/2010 DATE OF DISCHARGE:                              OPERATIVE REPORT   PREOPERATIVE DIAGNOSIS:  Asymptomatic right carotid stenosis.  POSTOPERATIVE DIAGNOSIS:  Asymptomatic right carotid stenosis.  PROCEDURE PERFORMED:  Right carotid endarterectomy with patch angioplasty.  SURGEON: 1. Leia Alf, MD  ASSISTANT:  Evorn Gong, PA  ANESTHESIA:  General.  BLOOD LOSS:  100 mL  FINDINGS:  A 75% to 80% stenosis.  SPECIMENS:  Carotid plaque.  INDICATIONS:  This is a 60 year old female who was found to have a carotid bruit followed up with duplex ultrasound which showed high-grade right carotid stenosis greater than 80%.  She was asymptomatic.  She comes in today for operative repair.  PROCEDURE:  The patient was identified in the holding area and taken to room A, placed supine on the table.  General anesthesia was administered.  The patient was prepped and draped in usual fashion. Time-out was called.  Antibiotics were given.  Incision was made along the anterior border of the right sternocleidomastoid.  Cautery was used to divide the subcutaneous tissue.  The platysma muscle was divided with cautery.  The internal jugular vein was reflected laterally and sharp dissection was used to get down to the carotid artery.  The bifurcation was in the mid neck well below the common facial vein.  The common carotid artery was circumferentially dissected free in this area and an umbilical tape was passed.  Cephalad dissection was then performed.  The superior thyroid artery was identified and circled with Potts 2-0 silk tie.  The external carotid artery was then isolated.  I then turned my attention towards  the internal carotid artery.  The common facial vein was skeletonized and then ligated between 2-0 silk ties and metal clips. This gave excellent closure to the distal internal carotid artery which was mobilized well past the bifurcation.  The hypoglossal nerve was not visualized as we were well below its usual location.  An umbilical tape was passed around the internal carotid artery.  The patient was fully heparinized.  After the heparin had circulated, the internal carotid artery was occluded followed by the common and external carotid artery. A #11 blade was used to make an arteriotomy which was extended along the anterolateral border of the common and internal carotid artery.  A 10- [place] shunt was then placed.  Next, endarterectomy was performed using a Chief Strategy Officer.  Eversion endarterectomy was performed of the external carotid artery.  A good distal endpoint was obtained and the plaque was removed as a specimen.  There was approximately 75% to 80% stenosis without thrombus.  There was a very smooth plaque.  There was one focal area in the common carotid artery where the plaque lifted down the media down to the adventitia.  The media was then replaced and tacked down with 7-0 Prolene.  I did place one  distal 7-0 tacking suture in the distal plaque in the distal internal carotid artery.  The endarterectomized bed was then copiously irrigated.  All potential embolic debris was removed.  Next, a bovine pericardial patch was selected.  Patch angioplasty was performed using a running 6-0 Prolene. Prior to completion, the shunt was removed.  The common, internal, and external carotid arteries were all appropriately flushed.  The artery was then irrigated with heparinized saline.  The anastomosis was then completed.  The clamp was released on the external carotid artery followed by the common carotid artery.  Approximately 30 seconds later, blood flow was reestablished by  removing the clamp on the internal carotid artery.  Handheld Doppler was used to evaluate the signal in the common, internal, and external carotid artery, they all had appropriate signals.  The patient's heparin was then reversed with 50 mg protamine. Once hemostasis was adequate, the carotid sheath was reapproximated with 3-0 Vicryl.  The platysma muscle was closed with 3-0 Vicryl and the skin was closed with running 4-0 Vicryl.  Dermabond was placed on the wound.     Eldridge Abrahams, MD     VWB/MEDQ  D:  07/04/2010  T:  07/04/2010  Job:  [id number]  Electronically Signed by Orvan Falconer IV MD on 07/17/2010 01:28:27 PM

## 2010-07-24 ENCOUNTER — Encounter: Payer: Self-pay | Admitting: Internal Medicine

## 2010-07-24 ENCOUNTER — Other Ambulatory Visit: Payer: Self-pay | Admitting: Internal Medicine

## 2010-07-24 ENCOUNTER — Ambulatory Visit (INDEPENDENT_AMBULATORY_CARE_PROVIDER_SITE_OTHER): Payer: Medicare Other | Admitting: Internal Medicine

## 2010-07-24 ENCOUNTER — Ambulatory Visit (INDEPENDENT_AMBULATORY_CARE_PROVIDER_SITE_OTHER): Payer: Medicare Other | Admitting: Dietician

## 2010-07-24 DIAGNOSIS — K219 Gastro-esophageal reflux disease without esophagitis: Secondary | ICD-10-CM

## 2010-07-24 DIAGNOSIS — F419 Anxiety disorder, unspecified: Secondary | ICD-10-CM

## 2010-07-24 DIAGNOSIS — E119 Type 2 diabetes mellitus without complications: Secondary | ICD-10-CM

## 2010-07-24 DIAGNOSIS — J449 Chronic obstructive pulmonary disease, unspecified: Secondary | ICD-10-CM

## 2010-07-24 DIAGNOSIS — D649 Anemia, unspecified: Secondary | ICD-10-CM

## 2010-07-24 DIAGNOSIS — I10 Essential (primary) hypertension: Secondary | ICD-10-CM

## 2010-07-24 DIAGNOSIS — J4489 Other specified chronic obstructive pulmonary disease: Secondary | ICD-10-CM

## 2010-07-24 DIAGNOSIS — R252 Cramp and spasm: Secondary | ICD-10-CM

## 2010-07-24 DIAGNOSIS — F411 Generalized anxiety disorder: Secondary | ICD-10-CM

## 2010-07-24 HISTORY — DX: Anxiety disorder, unspecified: F41.9

## 2010-07-24 LAB — CBC WITH DIFFERENTIAL/PLATELET
Basophils Absolute: 0 10*3/uL (ref 0.0–0.1)
Basophils Relative: 1 % (ref 0–1)
Eosinophils Absolute: 0.1 10*3/uL (ref 0.0–0.7)
Eosinophils Relative: 2 % (ref 0–5)
HCT: 37 % (ref 36.0–46.0)
Hemoglobin: 11.5 g/dL — ABNORMAL LOW (ref 12.0–15.0)
Lymphocytes Relative: 28 % (ref 12–46)
Lymphs Abs: 2 10*3/uL (ref 0.7–4.0)
MCH: 26.6 pg (ref 26.0–34.0)
MCHC: 31.1 g/dL (ref 30.0–36.0)
MCV: 85.6 fL (ref 78.0–100.0)
Monocytes Absolute: 0.8 10*3/uL (ref 0.1–1.0)
Monocytes Relative: 11 % (ref 3–12)
Neutro Abs: 4.3 10*3/uL (ref 1.7–7.7)
Neutrophils Relative %: 59 % (ref 43–77)
Platelets: 277 10*3/uL (ref 150–400)
RBC: 4.32 MIL/uL (ref 3.87–5.11)
RDW: 15.7 % — ABNORMAL HIGH (ref 11.5–15.5)
WBC: 7.2 10*3/uL (ref 4.0–10.5)

## 2010-07-24 LAB — COMPREHENSIVE METABOLIC PANEL
ALT: 10 U/L (ref 0–35)
AST: 14 U/L (ref 0–37)
Albumin: 4.2 g/dL (ref 3.5–5.2)
Alkaline Phosphatase: 49 U/L (ref 39–117)
BUN: 14 mg/dL (ref 6–23)
CO2: 27 mEq/L (ref 19–32)
Calcium: 9.4 mg/dL (ref 8.4–10.5)
Chloride: 101 mEq/L (ref 96–112)
Creat: 0.9 mg/dL (ref 0.50–1.10)
Glucose, Bld: 226 mg/dL — ABNORMAL HIGH (ref 70–99)
Potassium: 4.1 mEq/L (ref 3.5–5.3)
Sodium: 137 mEq/L (ref 135–145)
Total Bilirubin: 0.3 mg/dL (ref 0.3–1.2)
Total Protein: 6.5 g/dL (ref 6.0–8.3)

## 2010-07-24 LAB — GLUCOSE, CAPILLARY: Glucose-Capillary: 239 mg/dL — ABNORMAL HIGH (ref 70–99)

## 2010-07-24 LAB — POCT GLYCOSYLATED HEMOGLOBIN (HGB A1C): Hemoglobin A1C: 8.6

## 2010-07-24 MED ORDER — ALPRAZOLAM 1 MG PO TABS: 1.0000 mg | ORAL_TABLET | Freq: Three times a day (TID) | ORAL | Status: DC | PRN

## 2010-07-24 MED ORDER — OMEPRAZOLE 40 MG PO CPDR: 40.0000 mg | DELAYED_RELEASE_CAPSULE | Freq: Two times a day (BID) | ORAL | Status: DC

## 2010-07-24 MED ORDER — INSULIN GLARGINE 100 UNIT/ML ~~LOC~~ SOLN: 48.0000 [IU] | Freq: Every day | SUBCUTANEOUS | Status: DC

## 2010-07-24 NOTE — Assessment & Plan Note (Addendum)
Patient's blood sugars are significantly elevated and are averaging in the mid 200s to 300s.   He is without signs, symptoms, or history to suggest an underlying infectious process that could be contributing to her hyperglycemia.  She is currently using 43 units of Lantus at night and between 3-5 units of NovoLog at mealtimes.  I will increase her from 43 to 48 units of Lantus have her return in 2 weeks to review her CBGs and assess for further need for insulin adjustment.  She will also be meeting with Barnabas Harries today, will followup Donna's  recommendations regarding insulin.  Will check hemoglobin A1c today.  Will discuss referral to ophthalmology for annual eye exam her next visit.  35 minutes with at least 50% to face time was spent counseling patient about her numerous chronic medical conditions and coordinating care with other providers

## 2010-07-24 NOTE — Progress Notes (Signed)
  Subjective:    Patient ID: [name], female    DOB: 12/03/1950, 60 y.o.   MRN: [id number]  HPI [name] is a 60 year old female who presents for routine followup of her chronic medical conditions. She has a number of complaints today as outlined below.  DM: pt reports elevated CBGs averaging above 200.  She is currently using 43u of Lantus and 3-5u of novolog at meal times.  She has had no hypoglycemic events and reports general malaise.  She complaints of pain "all over" in her legs, back, and back.  She describes the pain as persistant, dull pain that worsens with movement and is associated with muscle cramps in her feet and legs.  She states that the hydrocodone will dull the pain but she feels it clouds her mind.  She reports an adverse reaction to Percocet; it causes a headache.    She also is concerned about anemia. She was recently hospitalized for a right carotid endarterectomy. The discharge summary reports anemia secondary to blood loss during the surgical procedure. Patient believes she has underlying anemia and that her anemia was not the result of blood loss. She denies BRBPR or dark tarry stools.   She denies any other abnormal eating.   She reports some shortness of breath but denies chest pain. She denies increased cough.  She has decreased her smoking.  She states she is using her Spirivas as directed but is only taking her Advair once daily.  She has had a recent right carotid endarectomy.  She feels this has gone well but she continues to have some mild pain at the surgical site.  She follows with [name] June 18th.  She will see [name] on July 2 to discuss possible MVR.  She is very anxious about this and reports increased anxiety that is moderately well controlled with her Xanax.   Review of Systems    denies chest pain, syncope, headache, visual changes, neurological complaint, abdominal pain, nausea, vomiting, diarrhea, bloody bowel movements, fever,  chills or dysuria. Objective:   Physical Exam VItal signs reviewed and stable. GEN: No apparent distress.  Alert and oriented x 3.  Pleasant, conversant, and cooperative to exam. HEENT: head is autraumatic and normocephalic.  Neck is supple without palpable masses or lymphadenopathy.  No JVD.  Surgical scar present on the right neck along carotid artery; this appears to be healing well.  Vision intact.  EOMI.  PERRLA.  Sclerae anicteric.  Conjunctivae without pallor or injection. Mucous membranes are moist.  Oropharynx is without erythema, exudates, or other abnormal lesions.  Patient is edentulous  RESP:  Lungs are clear to ascultation bilaterally with decreased air movement prolonged expiratory phase.  No wheezes, ronchi, or rubs. CARDIOVASCULAR: regular rate, normal rhythm.  Clear S1, S2. ABDOMEN: soft, non-tender, non-distended.  Bowels sounds present in all quadrants and normoactive.  No palpable masses. EXT: warm and dry.  Peripheral pulses equal, intact, and +2 globally.  No clubbing or cyanosis.  +1 edema in bilateral lower extremities. SKIN: warm and dry with normal turgor.  No rashes or abnormal lesions observed. NEURO: CN II-XII grossly intact.  Muscle strength +5/5 in bilateral upper and lower extremities.  Sensation is grossly intact.  No focal deficit.        Assessment & Plan:

## 2010-07-24 NOTE — Assessment & Plan Note (Signed)
Patient is very concerned about underlying anemia. Recently hospitalized for carotid endarterectomy and had acute anemia as a result of blood loss sustained during the procedure; this resolved with transfusion of 2 units packed red blood cells. He is without history or symptoms to suggest abnormal blood loss including GERD blood per rectum or dark tarry stools. We'll check a CBC today.

## 2010-07-24 NOTE — Assessment & Plan Note (Signed)
Pressure stable slightly low today. Will check a comprehensive metabolic panel to assess electrolyte status and renal function.  Art this time we will not make any changes to her antihypertensive regimen.

## 2010-07-24 NOTE — Patient Instructions (Signed)
I will mail you typed copy of correction scale for Novolog insulin if approved by your doctor.  Please try to eat 3 carb servings each meal and > 15 grams for snacks.  We can schedule an eye exam at next month's appointment.

## 2010-07-24 NOTE — Assessment & Plan Note (Signed)
I am unsure of the etiology behind patient's muscle cramps. Will check a comprehensive metabolic panel.  I advised her to try a Xanax tablet when she next experiences a cramp see if this helps resolve her symptoms.

## 2010-07-24 NOTE — Assessment & Plan Note (Signed)
Reports increased anxiety about her numerous medical problems; particularly about her recent hospitalization with carotid endarterectomy and concerned about her mitral valve and the potential need for valve replacement.  She has been using her Xanax as directed but believes this is not controlling her anxiety as well given her recent increased stress.  I will increase her Xanax to 1 mg 3 times a day when necessary and give her 90 tablets for the next month with the plan to resume twice a day dosing after her acute stressors resolve.

## 2010-07-24 NOTE — Patient Instructions (Addendum)
Schedule a followup appointment with Dr. Jerelene Redden for June 25, 26, or 27th. Be sure to check your blood sugar regularly and often over the next 2 weeks as we may need to make additional changes to your insulin. I will call you if any of her lab work is abnormal. Be sure to take your Advair twice a day. This will help with your breathing and COPD. Try taking over the counter simethicone to help with gas. Increase your Lantus to 48u of Lantus each night. If you experience any low blood sugars below 70 call the clinic at  [phone] and ask to speak to either Dr. Jerelene Redden or Barnabas Harries

## 2010-07-24 NOTE — Assessment & Plan Note (Signed)
Her COPD seems to be relatively well controlled and she is not exhibiting symptoms of an acute exacerbation. She admits to taking her Advair on once a day, I believe her symptoms would be better controlled if she were using the Advair as directed.  She agrees to do this.  WIll follow up on this at her next OV in 2 weeks.

## 2010-07-24 NOTE — Progress Notes (Signed)
Medical Nutrition Therapy:  Appt start time: 0930 end time:  1000.  Assessment:  Primary concerns today: Blood sugar control Usual eating pattern includes Meal 2 and 2+ snacks per day.    Avoided foods include: not covered today .    Usual physical activity includes patient recovering from surgery, is not exercising other than ADLs  Diagnosis and Intervention:  Progress Towards Goal(s):  In progress   Nutritional Diagnosis:  NB-1.1 Food and nutrition-related knowledge deficit As related to not knowing how to dose Novolog insulin to correct high blood sugar.  As evidenced by her report of eating more than 3 carbs at most meals , verbalizing knowledge that 3 units only enough insulin for 3 carb choices and elevated blood sugars.     Interventions: 1- assisted patient in understanding how rapid acting insulin can be used to correct high blood sugars. 2- chart provided for patient today on how to correct her blood sugar when higher than 150 mg/dl before meals. Patient understood not to start using until 5-7 days after todays increas ein lantus 3- Coordination of care- discussed  with physician adding correction insulin to food dose Novolog for blood sugar excursions. 4- Briefly reviewed ( patient with headache) carb choices and 3 per meal 3x  A day with > 15 grams per snacks if possible. List provided for appropriate snacks and meal choices.  Monitoring/Evaluation:  Dietary intake, blood sugars and insulin amounts in 4 week(s)

## 2010-07-25 ENCOUNTER — Telehealth: Payer: Self-pay | Admitting: *Deleted

## 2010-07-25 NOTE — Telephone Encounter (Signed)
Erin, PPA, calls to ask if pt is suppose to be getting prilosec name brand or generic, pt states she had a lengthy conversation with you and she is to only get name brand??? Also, she ask about her insulin, i saw donnar's note do you want her taking the novolog as donna has suggested? Please advise.  Thanks, h.

## 2010-07-25 NOTE — Telephone Encounter (Signed)
She may start using her meal time insulin as outlined in Donna's note in addition to the increase in her basal insulin.  She does not necessarily need to receive brand-name Prilosec but does need omeprazole; she has been getting Dexilant instead of omeprazole and her symptoms are not improved/controlled with Dexilant.

## 2010-07-29 ENCOUNTER — Other Ambulatory Visit: Payer: Self-pay | Admitting: *Deleted

## 2010-07-29 ENCOUNTER — Ambulatory Visit (INDEPENDENT_AMBULATORY_CARE_PROVIDER_SITE_OTHER): Payer: Medicare Other | Admitting: Surgery

## 2010-07-29 DIAGNOSIS — I6529 Occlusion and stenosis of unspecified carotid artery: Secondary | ICD-10-CM

## 2010-07-29 MED ORDER — DULOXETINE HCL 30 MG PO CPEP: 30.0000 mg | ORAL_CAPSULE | Freq: Every day | ORAL | Status: DC

## 2010-07-29 MED ORDER — ROSUVASTATIN CALCIUM 20 MG PO TABS: 20.0000 mg | ORAL_TABLET | Freq: Every day | ORAL | Status: DC

## 2010-07-29 MED ORDER — TIOTROPIUM BROMIDE MONOHYDRATE 18 MCG IN CAPS: 18.0000 ug | ORAL_CAPSULE | Freq: Every day | RESPIRATORY_TRACT | Status: DC

## 2010-07-29 MED ORDER — LORATADINE 10 MG PO TABS: 10.0000 mg | ORAL_TABLET | Freq: Every day | ORAL | Status: DC

## 2010-07-29 MED ORDER — DOCUSATE SODIUM 100 MG PO CAPS: 100.0000 mg | ORAL_CAPSULE | Freq: Two times a day (BID) | ORAL | Status: DC

## 2010-07-29 MED ORDER — INSULIN ASPART 100 UNIT/ML ~~LOC~~ SOLN: SUBCUTANEOUS | Status: DC

## 2010-07-29 NOTE — Assessment & Plan Note (Signed)
OFFICE VISIT  MAYZIE, CAUGHLIN A DOB:  04-30-1950                                       07/29/2010 CHART#:[id number]  Patient returns today for a follow-up.  She is status post right carotid endarterectomy on 07/04/2010 for asymptomatic disease.  Operative findings included 80% stenosis.  Her postoperative course was relatively uncomplicated.  She did have some trouble swallowing.  This was thought to be due to the intubation.  She also required a blood transfusion. She is back today for follow-up.  She is feeling well.  She has no complaints other than some cramping in her legs.  PHYSICAL EXAMINATION:  Neurologically, she is intact.  Her incision is well-healed.  I cannot palpate pedal pulses.  ASSESSMENT/PLAN:  Carotid stenosis:  I am going to have the patient come back in 3 months for a repeat carotid ultrasound to evaluate the site of repair as well as the contralateral side which was in the 60% to 80% range. Leg cramps:  Dr. Jacalyn Lefevre office has done ABIs in the past.  I do not have a copy of these results.  I will request that he send these to me so that we can discuss these findings.  If they are related to arterial insufficiency, we can address these in 3 months.    Eldridge Abrahams, MD Electronically Signed  VWB/MEDQ  D:  07/29/2010  T:  07/29/2010  Job:  [id number]  cc:   Denice Bors. Stanford Breed, MD, [facility] Trinidad Curet, MD Dr. Jerelene Redden

## 2010-07-30 ENCOUNTER — Other Ambulatory Visit (INDEPENDENT_AMBULATORY_CARE_PROVIDER_SITE_OTHER): Payer: Medicare Other | Admitting: *Deleted

## 2010-07-30 DIAGNOSIS — E119 Type 2 diabetes mellitus without complications: Secondary | ICD-10-CM

## 2010-07-31 MED ORDER — "INSULIN SYRINGE 31G X 5/16"" 0.3 ML MISC": 1.0000 | Freq: Two times a day (BID) | Status: DC

## 2010-08-02 ENCOUNTER — Emergency Department (HOSPITAL_COMMUNITY): Payer: Medicare Other

## 2010-08-02 ENCOUNTER — Inpatient Hospital Stay (HOSPITAL_COMMUNITY)
Admission: EM | Admit: 2010-08-02 | Discharge: 2010-08-07 | DRG: 093 | Disposition: A | Discharge status: Home / Self Care | Payer: Medicare Other | Attending: Internal Medicine | Admitting: Internal Medicine

## 2010-08-02 DIAGNOSIS — G4733 Obstructive sleep apnea (adult) (pediatric): Secondary | ICD-10-CM | POA: Diagnosis present

## 2010-08-02 DIAGNOSIS — E785 Hyperlipidemia, unspecified: Secondary | ICD-10-CM | POA: Diagnosis present

## 2010-08-02 DIAGNOSIS — L539 Erythematous condition, unspecified: Secondary | ICD-10-CM | POA: Diagnosis present

## 2010-08-02 DIAGNOSIS — G8929 Other chronic pain: Secondary | ICD-10-CM | POA: Diagnosis present

## 2010-08-02 DIAGNOSIS — I10 Essential (primary) hypertension: Secondary | ICD-10-CM | POA: Diagnosis present

## 2010-08-02 DIAGNOSIS — F3289 Other specified depressive episodes: Secondary | ICD-10-CM | POA: Diagnosis present

## 2010-08-02 DIAGNOSIS — K219 Gastro-esophageal reflux disease without esophagitis: Secondary | ICD-10-CM | POA: Diagnosis present

## 2010-08-02 DIAGNOSIS — J4489 Other specified chronic obstructive pulmonary disease: Secondary | ICD-10-CM | POA: Diagnosis present

## 2010-08-02 DIAGNOSIS — F329 Major depressive disorder, single episode, unspecified: Secondary | ICD-10-CM | POA: Diagnosis present

## 2010-08-02 DIAGNOSIS — E119 Type 2 diabetes mellitus without complications: Secondary | ICD-10-CM | POA: Diagnosis present

## 2010-08-02 DIAGNOSIS — J449 Chronic obstructive pulmonary disease, unspecified: Secondary | ICD-10-CM | POA: Diagnosis present

## 2010-08-02 DIAGNOSIS — R269 Unspecified abnormalities of gait and mobility: Principal | ICD-10-CM | POA: Diagnosis present

## 2010-08-02 LAB — BASIC METABOLIC PANEL
BUN: 15 mg/dL (ref 6–23)
CO2: 29 mEq/L (ref 19–32)
Calcium: 9.1 mg/dL (ref 8.4–10.5)
Chloride: 99 mEq/L (ref 96–112)
Creatinine, Ser: 0.8 mg/dL (ref 0.50–1.10)
GFR calc Af Amer: >60 mL/min (ref >60)
GFR calc non Af Amer: >60 mL/min (ref >60)
Glucose, Bld: 183 mg/dL — ABNORMAL HIGH (ref 70–99)
Potassium: 3.9 mEq/L (ref 3.5–5.1)
Sodium: 134 mEq/L — ABNORMAL LOW (ref 135–145)

## 2010-08-02 LAB — GLUCOSE, CAPILLARY: Glucose-Capillary: 172 mg/dL — ABNORMAL HIGH (ref 70–99)

## 2010-08-02 LAB — DIFFERENTIAL
Basophils Absolute: 0 10*3/uL (ref 0.0–0.1)
Basophils Relative: 1 % (ref 0–1)
Eosinophils Absolute: 0.2 10*3/uL (ref 0.0–0.7)
Eosinophils Relative: 2 % (ref 0–5)
Lymphocytes Relative: 31 % (ref 12–46)
Lymphs Abs: 2.2 10*3/uL (ref 0.7–4.0)
Monocytes Absolute: 0.6 10*3/uL (ref 0.1–1.0)
Monocytes Relative: 9 % (ref 3–12)
Neutro Abs: 4.1 10*3/uL (ref 1.7–7.7)
Neutrophils Relative %: 57 % (ref 43–77)

## 2010-08-02 LAB — PROTIME-INR
INR: 0.99 (ref 0.00–1.49)
Prothrombin Time: 13.3 seconds (ref 11.6–15.2)

## 2010-08-02 LAB — CBC
HCT: 34.9 % — ABNORMAL LOW (ref 36.0–46.0)
Hemoglobin: 11.6 g/dL — ABNORMAL LOW (ref 12.0–15.0)
MCH: 27.6 pg (ref 26.0–34.0)
MCHC: 33.2 g/dL (ref 30.0–36.0)
MCV: 83.1 fL (ref 78.0–100.0)
Platelets: 192 10*3/uL (ref 150–400)
RBC: 4.2 MIL/uL (ref 3.87–5.11)
RDW: 15.5 % (ref 11.5–15.5)
WBC: 7.1 10*3/uL (ref 4.0–10.5)

## 2010-08-03 ENCOUNTER — Encounter: Payer: Self-pay | Admitting: Internal Medicine

## 2010-08-03 ENCOUNTER — Inpatient Hospital Stay (HOSPITAL_COMMUNITY): Payer: Medicare Other

## 2010-08-03 DIAGNOSIS — R269 Unspecified abnormalities of gait and mobility: Secondary | ICD-10-CM

## 2010-08-03 LAB — COMPREHENSIVE METABOLIC PANEL
ALT: 12 U/L (ref 0–35)
AST: 12 U/L (ref 0–37)
Albumin: 3.4 g/dL — ABNORMAL LOW (ref 3.5–5.2)
Alkaline Phosphatase: 47 U/L (ref 39–117)
BUN: 15 mg/dL (ref 6–23)
CO2: 31 mEq/L (ref 19–32)
Calcium: 9.1 mg/dL (ref 8.4–10.5)
Chloride: 101 mEq/L (ref 96–112)
Creatinine, Ser: 0.78 mg/dL (ref 0.50–1.10)
GFR calc Af Amer: >60 mL/min (ref >60)
GFR calc non Af Amer: >60 mL/min (ref >60)
Glucose, Bld: 142 mg/dL — ABNORMAL HIGH (ref 70–99)
Potassium: 4.3 mEq/L (ref 3.5–5.1)
Sodium: 139 mEq/L (ref 135–145)
Total Bilirubin: 0.2 mg/dL — ABNORMAL LOW (ref 0.3–1.2)
Total Protein: 6.4 g/dL (ref 6.0–8.3)

## 2010-08-03 LAB — URINALYSIS, ROUTINE W REFLEX MICROSCOPIC
Bilirubin Urine: NEGATIVE
Glucose, UA: 250 mg/dL — AB
Hgb urine dipstick: NEGATIVE
Ketones, ur: NEGATIVE mg/dL
Leukocytes, UA: NEGATIVE
Nitrite: NEGATIVE
Protein, ur: NEGATIVE mg/dL
Specific Gravity, Urine: 1.013 (ref 1.005–1.030)
Urobilinogen, UA: 0.2 mg/dL (ref 0.0–1.0)
pH: 6 (ref 5.0–8.0)

## 2010-08-03 LAB — GLUCOSE, CAPILLARY
Glucose-Capillary: 267 mg/dL — ABNORMAL HIGH (ref 70–99)
Glucose-Capillary: 169 mg/dL — ABNORMAL HIGH (ref 70–99)
Glucose-Capillary: 291 mg/dL — ABNORMAL HIGH (ref 70–99)
Glucose-Capillary: 238 mg/dL — ABNORMAL HIGH (ref 70–99)

## 2010-08-03 LAB — LIPID PANEL
Cholesterol: 131 mg/dL (ref 0–200)
HDL: 33 mg/dL — ABNORMAL LOW (ref >39)
LDL Cholesterol: 59 mg/dL (ref 0–99)
Total CHOL/HDL Ratio: 4 RATIO
Triglycerides: 196 mg/dL — ABNORMAL HIGH (ref <150)
VLDL: 39 mg/dL (ref 0–40)

## 2010-08-03 LAB — CK TOTAL AND CKMB (NOT AT ARMC)
CK, MB: 2 ng/mL (ref 0.3–4.0)
Relative Index: INVALID (ref 0.0–2.5)
Total CK: 51 U/L (ref 7–177)

## 2010-08-03 LAB — APTT: aPTT: 27 seconds (ref 24–37)

## 2010-08-03 LAB — TROPONIN I: Troponin I: <0.3 ng/mL (ref <0.30)

## 2010-08-03 MED ORDER — GADOBENATE DIMEGLUMINE 529 MG/ML IV SOLN
16.0000 mL | Freq: Once | INTRAVENOUS | Status: AC | PRN
  Administered 2010-08-03: 16 mL via INTRAVENOUS

## 2010-08-03 NOTE — H&P (Signed)
Hospital Admission Note Date: 08/03/2010  Patient name: [name] Medical record number: [id number] Date of birth: 01-02-1951 Age: 60 y.o. Gender: female PCP: Trinidad Curet, MD  Medical Service: Internal Medicine Teaching Service B-1  Attending physician: Dr. Bertha Stakes  Resident (R2/R3): Dr. Lester Titanic Pager: [phone]  Resident (R1): Dr. [name] Pager: [phone]  Chief Complaint: Gait instability.  History of Present Illness: [name] is a 60 year old woman who presents to the [facility] ER because of gait instability. She has a PMH significant for tobacco abuse currently 1/2 ppd down from 2 ppd, diabetes with her last A1C of 8.3%, HTN, HLD on Crestor, OSA with CPAP, and recent carotid endarterectomy done by [name] on 07/04/10. She states she woke up this morning and when she went to stand up fell back down onto the bed. She got up again and felt unsteady and lightheaded on her feet. She states that she was stumbling to the right down the hall to her kitchen. She was able to get to her cane that she doesn't use often and used that to get around the rest of the morning. She took her morning medicines and 6 units of Novolog and ate her breakfast. She denies any numbness, tingling, weakness, slurred speech, problems with word finding, or syncope. She denies any fevers, chills, nausea, vomiting, chest or abdominal pain as well. She has had a headache today as well. The pain is "all over" and is an ache that gets worse with light and loud sounds. She states she took a nap and when she woke up felt worse so she called her vascular surgeon's office and they suggested she come to the ER for work up for possible stroke.   Current Outpatient Prescriptions  Medication Sig Dispense Refill  . albuterol (PROVENTIL,VENTOLIN) 90 MCG/ACT inhaler Inhale 1-2 puffs into the lungs every 4 (four) hours as needed.        . ALPRAZolam (XANAX) 1 MG tablet Take 1 tablet (1 mg total) by mouth 3 (three)  times daily as needed for sleep or anxiety.  90 tablet  1  . aspirin 81 MG tablet Take 81 mg by mouth daily.        Marland Kitchen buPROPion (WELLBUTRIN XL) 150 MG 24 hr tablet Take 1 tablet (150 mg total) by mouth daily.  30 tablet  0  . docusate sodium (COLACE) 100 MG capsule Take 1 capsule (100 mg total) by mouth 2 (two) times daily.  60 capsule  7  . DULoxetine (CYMBALTA) 30 MG capsule Take 1 capsule (30 mg total) by mouth daily. Take 3 tablets by mouth once daily  90 capsule  7  . Fluticasone-Salmeterol (ADVAIR DISKUS) 250-50 MCG/DOSE AEPB Inhale 1 puff into the lungs every 12 (twelve) hours.        Marland Kitchen glucose blood test strip 1 each by Other route as needed. Use as instructed       . HYDROcodone-acetaminophen (NORCO) 10-325 MG per tablet Take 1 tablet 4 times a day as needed for pain  120 tablet  5  . insulin aspart (NOVOLOG) 100 UNIT/ML injection 3 units to cover 3 carb choices each meal 3x a day. Add correction amount of Novolog to food Novolog if needed up to a total of 30 units per day: If blood sugar is: < 150= no correction, if 151-200 add 1 unit, if 201-250 add 2 units, if 251-300 add 3 units, if 301-350 add 4 units, if 351-400 add 5 units, if 401- 450  add 6 units, if > 450 please call office for instructions.  10 mL    . insulin glargine (LANTUS) 100 UNIT/ML injection Inject 48 Units into the skin at bedtime.  10 mL  3  . Insulin Syringe-Needle U-100 (INSULIN SYRINGE .3CC/31GX5/16") 31G X 5/16" 0.3 ML MISC 1 Syringe by Does not apply route 2 (two) times daily at 10 AM and 5 PM.  100 each  11  . lisinopril-hydrochlorothiazide (PRINZIDE,ZESTORETIC) 10-12.5 MG per tablet TAKE 1 TABLET BY MOUTH DAILY  30 tablet  PRN  . loratadine (CLARITIN) 10 MG tablet Take 1 tablet (10 mg total) by mouth daily.  30 tablet  7  . nystatin (MYCOSTATIN) cream Apply topically as needed.        Marland Kitchen omeprazole (PRILOSEC) 40 MG capsule Take 1 capsule (40 mg total) by mouth 2 (two) times daily.  60 capsule  6  . rosuvastatin  (CRESTOR) 20 MG tablet Take 1 tablet (20 mg total) by mouth daily.  30 tablet  7  . tiotropium (SPIRIVA) 18 MCG inhalation capsule Place 1 capsule (18 mcg total) into inhaler and inhale daily.  30 capsule  7   Allergies: Morphine and Tramadol hcl  Past Medical History  Diagnosis Date  . Depression   . GERD (gastroesophageal reflux disease)   . Hypertension   . Hyperlipidemia   . Diabetes mellitus   . COPD (chronic obstructive pulmonary disease) 2008     her CT of the chest there was no evidence of primary carcinoid tumor , scattered normal-sized mediastinal and hilar as well as axillary lymph nodes noted., no significant lymphadenopathy. COPD and emphysema with no acute cardiopulmonary disease noted , mild cardiomegaly and solitary calcified  plaque  . Pulmonary hypertension   . Mitral regurgitation   . Shingles   . Anemia   . History of colonic polyps   . Anxiety   . IBS (irritable bowel syndrome)   . Osteoporosis   . Fibromyalgia   . Sleep apnea   . Restless leg syndrome    Past Surgical History  Procedure Date  . Orif clavicle fracture   . Tonsillectomy    Family History  Problem Relation Age of Onset  . Stroke Neg Hx   . Cancer Neg Hx   . Heart disease Neg Hx    History   Social History  . Marital Status: Divorced    Spouse Name: N/A    Number of Children: N/A  . Years of Education: N/A   Occupational History  . Not on file.   Social History Main Topics  . Smoking status: Current Everyday Smoker -- 1.0 packs/day for 50 years    Types: Cigarettes  . Smokeless tobacco: Not on file  . Alcohol Use: No  . Drug Use: No  . Sexually Active: Not on file   Other Topics Concern  . Not on file   Social History Narrative  . No narrative on file   Review of Systems: Negative except per HPI.  Physical Exam: Vitals: T= 98.1 BP= 116/61 HR= 83, RR= 16, O2 Sat= 98% on RA. General:  alert elderly female. Well-developed, and cooperative to examination.   Head:   normocephalic and atraumatic.   Eyes:  vision grossly intact, pupils equal, pupils round, pupils reactive to light, no injection and anicteric.   Mouth:  pharynx pink and moist, no erythema, and no exudates.   Neck:  supple, full ROM, no thyromegaly, no JVD, and no carotid bruits.   Lungs:  normal respiratory effort, no accessory muscle use, normal breath sounds, no crackles, and no wheezes. Heart:  normal rate, regular rhythm, no murmur, no gallop, and no rub.   Abdomen:  soft, non-tender, normal bowel sounds, no distention, no guarding, no rebound tenderness, no hepatomegaly, and no splenomegaly.   Msk:  no joint swelling, no joint warmth, and no redness over joints.   Pulses:  2+ DP/PT pulses bilaterally Extremities:  No cyanosis, clubbing, edema Neurologic:  alert & oriented X3, cranial nerves II-XII intact, strength normal in all extremities, sensation intact to light touch, and Rhomberg was positive for instability.  Gait not assessed because of instability.  Skin:  turgor normal and no rashes.   Psych:  Oriented X3, memory intact for recent and remote, normally interactive, good eye contact, not anxious appearing, and not depressed appearing.  Lab results: Admission on 08/02/2010  Component Value Range  . Glucose-Capillary (mg/dL) 172* 70-99  . Comment 1  Documented in Chart    . Comment 2  Radiation protection practitioner    . Neutrophils Relative (%) 57  43-77  . Neutro Abs (K/uL) 4.1  1.7-7.7  . Lymphocytes Relative (%) 31  12-46  . Lymphs Abs (K/uL) 2.2  0.7-4.0  . Monocytes Relative (%) 9  3-12  . Monocytes Absolute (K/uL) 0.6  0.1-1.0  . Eosinophils Relative (%) 2  0-5  . Eosinophils Absolute (K/uL) 0.2  0.0-0.7  . Basophils Relative (%) 1  0-1  . Basophils Absolute (K/uL) 0.0  0.0-0.1  . WBC (K/uL) 7.1  4.0-10.5  . RBC (MIL/uL) 4.20  3.87-5.11  . Hemoglobin (g/dL) 11.6* 12.0-15.0  . HCT (%) 34.9* 36.0-46.0  . MCV (fL) 83.1  78.0-100.0  . MCH (pg) 27.6  26.0-34.0  . MCHC (g/dL) 33.2   30.0-36.0  . RDW (%) 15.5  11.5-15.5  . Platelets (K/uL) 192  150-400  . Sodium (mEq/L) 134* 135-145  . Potassium (mEq/L) 3.9  3.5-5.1  . Chloride (mEq/L) 99  96-112  . CO2 (mEq/L) 29  19-32  . Glucose, Bld (mg/dL) 183* 70-99  . BUN (mg/dL) 15  6-23  . Creatinine, Ser (mg/dL) 0.80  0.50-1.10   **Please note change in reference range.**  . Calcium (mg/dL) 9.1  8.4-10.5  . GFR calc non Af Amer (mL/min) >60  >60  . GFR calc Af Amer (mL/min) >60  >60   Comment:                                 The eGFR has been calculated                          using the MDRD equation.                          This calculation has not been                          validated in all clinical                          situations.                          eGFR's persistently                          <  60 mL/min signify                          possible Chronic Kidney Disease.  Marland Kitchen Prothrombin Time (seconds) 13.3  11.6-15.2  . INR  0.99  0.00-1.49     Imaging results:    CT HEAD WITHOUT CONTRAST  Findings: No acute hemorrhage, acute infarction, or mass lesion is  identified.  No midline shift.  No ventriculomegaly.  No skull  fracture.  Orbits and paranasal sinuses are intact.  Assessment & Plan by Problem:  1. Gait Instability - At this point the most concerning thing in the differential is an acute brain stem stroke given the relatively sudden onset of dizziness, unsteady gait.  Pt has no focal deficits, no slurred speech, and no facial droop. This occurred/started >12 hours before the patient presented to the hospital, putting this patient outside the window for thrombolytic therapy. Non-contrast CT of the head is negative for acute bleed. It also does not show acute stroke. We will order Head MRI/MRI, hold any anticoagulations for at least 48 hours. Pt is on aspirin at home, if this is confirmed to be a stroke by MRI, then it would be considered an aspirin failure and pt would need to be placed on plavix or  similar anticoagulant. Will admit patient to telemetry and order stroke core measure orders which includes swallow eval, PT/OT consults, and fall precautions.   2. Hyperlipidemia - Last FLP showed an LDL 45 in 03/2009, Will recheck Lipid panel and liver function tests. We will continue Cestor 42m daily. If LDL is higher than 70 consider increase statin dose (as having a target of <70 of LDL has been shown to reduce longterm CV complications from DM as shown in CARDS trial)  3. Hypertension - well controlled. Patient is on Lisinopril-HCTZ at home. Will restart and continue to monitor.   4. Diabetes Mellitus, Type II - HgbA1C 8.6 (6/132012). Not well controlled. Will place patient on home diabetes med Lantus Units 48 units QHS and sensitive SSI and check CBG qAC-HS. Will write for diabetes education as an inpatient.  Carb modified diet.   5. Depression and Chronic Pain Syndrome - at baseline, patient denies SI/HI, continue home meds.  6. COPD - At baseline, continue home inhailers  7. OSA -will write for CPAP machine at bedtime.   8. Tobacco Use: Patient was counseled on smoking cessation strategies including medications and behavior modification options. Patient said she was not ready to stop smoking at this time. Will provide nicotine patch while inpatient.   9. GERD:  Will continue her on Protonix while hospitalized.

## 2010-08-04 DIAGNOSIS — I6529 Occlusion and stenosis of unspecified carotid artery: Secondary | ICD-10-CM

## 2010-08-04 LAB — BASIC METABOLIC PANEL
BUN: 17 mg/dL (ref 6–23)
CO2: 29 mEq/L (ref 19–32)
Calcium: 9.6 mg/dL (ref 8.4–10.5)
Chloride: 101 mEq/L (ref 96–112)
Creatinine, Ser: 0.87 mg/dL (ref 0.50–1.10)
GFR calc Af Amer: >60 mL/min (ref >60)
GFR calc non Af Amer: >60 mL/min (ref >60)
Glucose, Bld: 133 mg/dL — ABNORMAL HIGH (ref 70–99)
Potassium: 3.8 mEq/L (ref 3.5–5.1)
Sodium: 140 mEq/L (ref 135–145)

## 2010-08-04 LAB — CBC
HCT: 32.6 % — ABNORMAL LOW (ref 36.0–46.0)
Hemoglobin: 10.6 g/dL — ABNORMAL LOW (ref 12.0–15.0)
MCH: 27.2 pg (ref 26.0–34.0)
MCHC: 32.5 g/dL (ref 30.0–36.0)
MCV: 83.6 fL (ref 78.0–100.0)
Platelets: 162 10*3/uL (ref 150–400)
RBC: 3.9 MIL/uL (ref 3.87–5.11)
RDW: 15.6 % — ABNORMAL HIGH (ref 11.5–15.5)
WBC: 6.9 10*3/uL (ref 4.0–10.5)

## 2010-08-04 LAB — GLUCOSE, CAPILLARY
Glucose-Capillary: 216 mg/dL — ABNORMAL HIGH (ref 70–99)
Glucose-Capillary: 324 mg/dL — ABNORMAL HIGH (ref 70–99)
Glucose-Capillary: 261 mg/dL — ABNORMAL HIGH (ref 70–99)
Glucose-Capillary: 282 mg/dL — ABNORMAL HIGH (ref 70–99)

## 2010-08-05 LAB — CBC
HCT: 32.5 % — ABNORMAL LOW (ref 36.0–46.0)
Hemoglobin: 10.6 g/dL — ABNORMAL LOW (ref 12.0–15.0)
MCH: 27 pg (ref 26.0–34.0)
MCHC: 32.6 g/dL (ref 30.0–36.0)
MCV: 82.7 fL (ref 78.0–100.0)
Platelets: 173 10*3/uL (ref 150–400)
RBC: 3.93 MIL/uL (ref 3.87–5.11)
RDW: 15.7 % — ABNORMAL HIGH (ref 11.5–15.5)
WBC: 8.5 10*3/uL (ref 4.0–10.5)

## 2010-08-05 LAB — BASIC METABOLIC PANEL
BUN: 18 mg/dL (ref 6–23)
CO2: 29 mEq/L (ref 19–32)
Calcium: 9.3 mg/dL (ref 8.4–10.5)
Chloride: 96 mEq/L (ref 96–112)
Creatinine, Ser: 0.89 mg/dL (ref 0.50–1.10)
GFR calc Af Amer: >60 mL/min (ref >60)
GFR calc non Af Amer: >60 mL/min (ref >60)
Glucose, Bld: 223 mg/dL — ABNORMAL HIGH (ref 70–99)
Potassium: 3.9 mEq/L (ref 3.5–5.1)
Sodium: 133 mEq/L — ABNORMAL LOW (ref 135–145)

## 2010-08-05 LAB — GLUCOSE, CAPILLARY
Glucose-Capillary: 174 mg/dL — ABNORMAL HIGH (ref 70–99)
Glucose-Capillary: 277 mg/dL — ABNORMAL HIGH (ref 70–99)
Glucose-Capillary: 153 mg/dL — ABNORMAL HIGH (ref 70–99)
Glucose-Capillary: 215 mg/dL — ABNORMAL HIGH (ref 70–99)

## 2010-08-06 ENCOUNTER — Encounter: Payer: Medicare Other | Admitting: Internal Medicine

## 2010-08-06 DIAGNOSIS — R269 Unspecified abnormalities of gait and mobility: Secondary | ICD-10-CM

## 2010-08-06 DIAGNOSIS — M79609 Pain in unspecified limb: Secondary | ICD-10-CM

## 2010-08-06 LAB — GLUCOSE, CAPILLARY
Glucose-Capillary: 119 mg/dL — ABNORMAL HIGH (ref 70–99)
Glucose-Capillary: 217 mg/dL — ABNORMAL HIGH (ref 70–99)
Glucose-Capillary: 217 mg/dL — ABNORMAL HIGH (ref 70–99)
Glucose-Capillary: 277 mg/dL — ABNORMAL HIGH (ref 70–99)

## 2010-08-07 LAB — BASIC METABOLIC PANEL
BUN: 18 mg/dL (ref 6–23)
CO2: 32 mEq/L (ref 19–32)
Calcium: 9.1 mg/dL (ref 8.4–10.5)
Chloride: 97 mEq/L (ref 96–112)
Creatinine, Ser: 0.8 mg/dL (ref 0.50–1.10)
GFR calc Af Amer: >60 mL/min (ref >60)
GFR calc non Af Amer: >60 mL/min (ref >60)
Glucose, Bld: 138 mg/dL — ABNORMAL HIGH (ref 70–99)
Potassium: 3.8 mEq/L (ref 3.5–5.1)
Sodium: 137 mEq/L (ref 135–145)

## 2010-08-07 LAB — CBC
HCT: 32 % — ABNORMAL LOW (ref 36.0–46.0)
Hemoglobin: 10.3 g/dL — ABNORMAL LOW (ref 12.0–15.0)
MCH: 27.2 pg (ref 26.0–34.0)
MCHC: 32.2 g/dL (ref 30.0–36.0)
MCV: 84.7 fL (ref 78.0–100.0)
Platelets: 157 10*3/uL (ref 150–400)
RBC: 3.78 MIL/uL — ABNORMAL LOW (ref 3.87–5.11)
RDW: 16 % — ABNORMAL HIGH (ref 11.5–15.5)
WBC: 6.6 10*3/uL (ref 4.0–10.5)

## 2010-08-07 LAB — GLUCOSE, CAPILLARY
Glucose-Capillary: 145 mg/dL — ABNORMAL HIGH (ref 70–99)
Glucose-Capillary: 230 mg/dL — ABNORMAL HIGH (ref 70–99)

## 2010-08-08 ENCOUNTER — Ambulatory Visit: Payer: Medicare Other | Admitting: Dietician

## 2010-08-08 ENCOUNTER — Encounter: Payer: Medicare Other | Admitting: Internal Medicine

## 2010-08-12 ENCOUNTER — Encounter: Payer: Self-pay | Admitting: *Deleted

## 2010-08-12 ENCOUNTER — Encounter: Payer: Self-pay | Admitting: Cardiology

## 2010-08-12 ENCOUNTER — Ambulatory Visit (INDEPENDENT_AMBULATORY_CARE_PROVIDER_SITE_OTHER): Payer: Medicare Other | Admitting: Cardiology

## 2010-08-12 DIAGNOSIS — F17200 Nicotine dependence, unspecified, uncomplicated: Secondary | ICD-10-CM

## 2010-08-12 DIAGNOSIS — E785 Hyperlipidemia, unspecified: Secondary | ICD-10-CM

## 2010-08-12 DIAGNOSIS — I679 Cerebrovascular disease, unspecified: Secondary | ICD-10-CM

## 2010-08-12 DIAGNOSIS — Z720 Tobacco use: Secondary | ICD-10-CM

## 2010-08-12 DIAGNOSIS — I2789 Other specified pulmonary heart diseases: Secondary | ICD-10-CM

## 2010-08-12 DIAGNOSIS — I050 Rheumatic mitral stenosis: Secondary | ICD-10-CM

## 2010-08-12 DIAGNOSIS — I10 Essential (primary) hypertension: Secondary | ICD-10-CM

## 2010-08-12 NOTE — Patient Instructions (Signed)
Your physician recommends that you schedule a follow-up appointment in: [place] has requested that you have a TEE. During a TEE, sound waves are used to create images of your heart. It provides your doctor with information about the size and shape of your heart and how well your heart's chambers and valves are working. In this test, a transducer is attached to the end of a flexible tube that's guided down your throat and into your esophagus (the tube leading from you mouth to your stomach) to get a more detailed image of your heart. You are not awake for the procedure. Please see the instruction sheet given to you today. For further information please visit [email or web address].

## 2010-08-12 NOTE — Assessment & Plan Note (Signed)
Continue statin. 

## 2010-08-12 NOTE — Assessment & Plan Note (Signed)
Blood pressure controlled. Continue present medications. 

## 2010-08-12 NOTE — Assessment & Plan Note (Signed)
Patient with mitral stenosis by transthoracic echocardiogram. Mean gradient is extremely high but mitral valve area by pressure half-time is 2.2 cm. Also with moderate mitral regurgitation. Plan to proceed with transesophageal echocardiogram to better assess the mitral valve morphology and severity of MS. If severe MS she will most likely need cardiac catheterization and consideration of mitral valve replacement. Note she has a severely elevated pulmonary pressures.

## 2010-08-12 NOTE — Assessment & Plan Note (Signed)
Patient counseled on discontinuing.

## 2010-08-12 NOTE — Assessment & Plan Note (Signed)
Patient is now status post carotid endarterectomy. She is followed by vascular surgery. Continue aspirin and statin.

## 2010-08-12 NOTE — Assessment & Plan Note (Signed)
Patient's pulmonary pressures are severely elevated. This may be related to mitral stenosis but more likely to significant COPD.

## 2010-08-12 NOTE — Progress Notes (Signed)
HPI: 60 year old female I saw in April of 2012 for evaluation of chest pain. Previously seen by Dr. Domenic Polite. Echocardiogram in May of 2012 showed  EF 60-65; moderate MS (mean gradient 22 mmHg; MVA by pressure half time 2.2; overall felt most likely moderate). The findings are consistent with moderate stenosis; moderate MR; Mild-moderate regurgitation. Systolic pressure was severely increased. PA peak pressure: 63m Hg (S). Myoview in May 2012 showed an ejection fraction of 81%, probable soft tissue attenuation versus prior infarct and increased uptake in the right ventricle suggestive of elevated pulmonary pressures. ABIs in May 2012 were normal. Carotid Dopplers in May 2012 showed an 80-99% right and a 60-79% left stenosis. Patient had right carotid endarterectomy and is now followed by vascular surgery. Since I last saw her, she continues to have dyspnea on exertion which is a chronic problem. There is no orthopnea, PND, pedal edema, syncope or chest pain.  Current Outpatient Prescriptions  Medication Sig Dispense Refill  . albuterol (PROVENTIL,VENTOLIN) 90 MCG/ACT inhaler Inhale 1-2 puffs into the lungs every 4 (four) hours as needed.        . ALPRAZolam (XANAX) 1 MG tablet Take 1 tablet (1 mg total) by mouth 3 (three) times daily as needed for sleep or anxiety.  90 tablet  1  . aspirin 81 MG tablet Take 81 mg by mouth daily.        .Marland Kitchendocusate sodium (COLACE) 100 MG capsule Take 1 capsule (100 mg total) by mouth 2 (two) times daily.  60 capsule  7  . DULoxetine (CYMBALTA) 30 MG capsule Take 1 capsule (30 mg total) by mouth daily. Take 3 tablets by mouth once daily  90 capsule  7  . Fluticasone-Salmeterol (ADVAIR DISKUS) 250-50 MCG/DOSE AEPB Inhale 1 puff into the lungs every 12 (twelve) hours.        .Marland Kitchenglucose blood test strip 1 each by Other route as needed. Use as instructed       . HYDROcodone-acetaminophen (NORCO) 10-325 MG per tablet Take 1 tablet 4 times a day as needed for pain  120 tablet  5    . insulin aspart (NOVOLOG) 100 UNIT/ML injection 3 units to cover 3 carb choices each meal 3x a day. Add correction amount of Novolog to food Novolog if needed up to a total of 30 units per day: If blood sugar is: < 150= no correction, if 151-200 add 1 unit, if 201-250 add 2 units, if 251-300 add 3 units, if 301-350 add 4 units, if 351-400 add 5 units, if 401- 450 add 6 units, if > 450 please call office for instructions.  10 mL    . insulin glargine (LANTUS) 100 UNIT/ML injection Inject 50 Units into the skin at bedtime.        . Insulin Syringe-Needle U-100 (INSULIN SYRINGE .3CC/31GX5/16") 31G X 5/16" 0.3 ML MISC 1 Syringe by Does not apply route 2 (two) times daily at 10 AM and 5 PM.  100 each  11  . lisinopril-hydrochlorothiazide (PRINZIDE,ZESTORETIC) 10-12.5 MG per tablet TAKE 1 TABLET BY MOUTH DAILY  30 tablet  PRN  . loratadine (CLARITIN) 10 MG tablet Take 1 tablet (10 mg total) by mouth daily.  30 tablet  7  . nystatin (MYCOSTATIN) cream Apply topically as needed.        . rosuvastatin (CRESTOR) 20 MG tablet Take 1 tablet (20 mg total) by mouth daily.  30 tablet  7  . tiotropium (SPIRIVA) 18 MCG inhalation capsule Place 1 capsule (18 mcg  total) into inhaler and inhale daily.  30 capsule  7  . DISCONTD: insulin glargine (LANTUS) 100 UNIT/ML injection Inject 48 Units into the skin at bedtime.  10 mL  3  . buPROPion (WELLBUTRIN XL) 150 MG 24 hr tablet Take 1 tablet (150 mg total) by mouth daily.  30 tablet  0  . DISCONTD: omeprazole (PRILOSEC) 40 MG capsule Take 1 capsule (40 mg total) by mouth 2 (two) times daily.  60 capsule  6     Past Medical History  Diagnosis Date  . Depression   . GERD (gastroesophageal reflux disease)   . Hypertension   . Hyperlipidemia   . Diabetes mellitus   . COPD (chronic obstructive pulmonary disease) 2008     her CT of the chest there was no evidence of primary carcinoid tumor , scattered normal-sized mediastinal and hilar as well as axillary lymph nodes  noted., no significant lymphadenopathy. COPD and emphysema with no acute cardiopulmonary disease noted , mild cardiomegaly and solitary calcified  plaque  . Pulmonary hypertension   . Mitral regurgitation   . Shingles   . Anemia   . History of colonic polyps   . Anxiety   . IBS (irritable bowel syndrome)   . Osteoporosis   . Fibromyalgia   . Sleep apnea   . Restless leg syndrome   . Mitral stenosis     Past Surgical History  Procedure Date  . Orif clavicle fracture   . Tonsillectomy     History   Social History  . Marital Status: Divorced    Spouse Name: N/A    Number of Children: N/A  . Years of Education: N/A   Occupational History  . Not on file.   Social History Main Topics  . Smoking status: Current Everyday Smoker -- 1.0 packs/day for 50 years    Types: Cigarettes  . Smokeless tobacco: Not on file  . Alcohol Use: No  . Drug Use: No  . Sexually Active: Not on file   Other Topics Concern  . Not on file   Social History Narrative  . No narrative on file    ROS: no fevers or chills, productive cough, hemoptysis, dysphasia, odynophagia, melena, hematochezia, dysuria, hematuria, rash, seizure activity, orthopnea, PND, pedal edema, claudication. Remaining systems are negative.  Physical Exam: Well-developed well-nourished in no acute distress.  Skin is warm and dry.  HEENT is normal.  Neck is supple. No thyromegaly. Status post right carotid endarterectomy Chest is clear to auscultation with normal expansion.  Cardiovascular exam is regular rate and rhythm.  Abdominal exam nontender or distended. No masses palpated. Extremities show no edema. neuro grossly intact

## 2010-08-13 LAB — HM DIABETES EYE EXAM

## 2010-08-15 ENCOUNTER — Ambulatory Visit (HOSPITAL_COMMUNITY)
Admission: RE | Admit: 2010-08-15 | Discharge: 2010-08-15 | Disposition: A | Discharge status: Home / Self Care | Payer: Medicare Other | Source: Ambulatory Visit | Attending: Cardiology | Admitting: Cardiology

## 2010-08-15 DIAGNOSIS — E785 Hyperlipidemia, unspecified: Secondary | ICD-10-CM | POA: Insufficient documentation

## 2010-08-15 DIAGNOSIS — Z79899 Other long term (current) drug therapy: Secondary | ICD-10-CM | POA: Insufficient documentation

## 2010-08-15 DIAGNOSIS — Z7982 Long term (current) use of aspirin: Secondary | ICD-10-CM | POA: Insufficient documentation

## 2010-08-15 DIAGNOSIS — I10 Essential (primary) hypertension: Secondary | ICD-10-CM | POA: Insufficient documentation

## 2010-08-15 DIAGNOSIS — J449 Chronic obstructive pulmonary disease, unspecified: Secondary | ICD-10-CM | POA: Insufficient documentation

## 2010-08-15 DIAGNOSIS — I050 Rheumatic mitral stenosis: Secondary | ICD-10-CM | POA: Insufficient documentation

## 2010-08-15 DIAGNOSIS — R0609 Other forms of dyspnea: Secondary | ICD-10-CM | POA: Insufficient documentation

## 2010-08-15 DIAGNOSIS — R0989 Other specified symptoms and signs involving the circulatory and respiratory systems: Secondary | ICD-10-CM | POA: Insufficient documentation

## 2010-08-15 DIAGNOSIS — Z794 Long term (current) use of insulin: Secondary | ICD-10-CM | POA: Insufficient documentation

## 2010-08-15 DIAGNOSIS — Z860100 Personal history of colon polyps, unspecified: Secondary | ICD-10-CM | POA: Insufficient documentation

## 2010-08-15 DIAGNOSIS — F411 Generalized anxiety disorder: Secondary | ICD-10-CM | POA: Insufficient documentation

## 2010-08-15 DIAGNOSIS — F17200 Nicotine dependence, unspecified, uncomplicated: Secondary | ICD-10-CM | POA: Insufficient documentation

## 2010-08-15 DIAGNOSIS — I059 Rheumatic mitral valve disease, unspecified: Secondary | ICD-10-CM

## 2010-08-15 DIAGNOSIS — K219 Gastro-esophageal reflux disease without esophagitis: Secondary | ICD-10-CM | POA: Insufficient documentation

## 2010-08-15 DIAGNOSIS — I679 Cerebrovascular disease, unspecified: Secondary | ICD-10-CM | POA: Insufficient documentation

## 2010-08-15 DIAGNOSIS — F329 Major depressive disorder, single episode, unspecified: Secondary | ICD-10-CM | POA: Insufficient documentation

## 2010-08-15 DIAGNOSIS — K5900 Constipation, unspecified: Secondary | ICD-10-CM | POA: Insufficient documentation

## 2010-08-15 DIAGNOSIS — F3289 Other specified depressive episodes: Secondary | ICD-10-CM | POA: Insufficient documentation

## 2010-08-15 DIAGNOSIS — I2789 Other specified pulmonary heart diseases: Secondary | ICD-10-CM | POA: Insufficient documentation

## 2010-08-15 DIAGNOSIS — G2581 Restless legs syndrome: Secondary | ICD-10-CM | POA: Insufficient documentation

## 2010-08-15 DIAGNOSIS — M810 Age-related osteoporosis without current pathological fracture: Secondary | ICD-10-CM | POA: Insufficient documentation

## 2010-08-15 DIAGNOSIS — J4489 Other specified chronic obstructive pulmonary disease: Secondary | ICD-10-CM | POA: Insufficient documentation

## 2010-08-15 DIAGNOSIS — E119 Type 2 diabetes mellitus without complications: Secondary | ICD-10-CM | POA: Insufficient documentation

## 2010-08-15 LAB — GLUCOSE, CAPILLARY
Glucose-Capillary: 140 mg/dL — ABNORMAL HIGH (ref 70–99)
Glucose-Capillary: 101 mg/dL — ABNORMAL HIGH (ref 70–99)

## 2010-08-16 ENCOUNTER — Telehealth: Payer: Self-pay | Admitting: Cardiology

## 2010-08-16 NOTE — Telephone Encounter (Signed)
Spoke with pt, she wanted to let us know that she was awake during the TEE. she said she was told that because of her pulmonary HTN they could not give her a lot of medicine and that the person in front of her got the same amount and it knocked him out. She just wanted to let us know she was very uncomfortable and she is very scared about having any other procedures. Reassurance given to pt she will call with any problems Fredia Beets

## 2010-08-16 NOTE — Telephone Encounter (Signed)
Pt has questions about procedure she had done yesterday

## 2010-08-21 ENCOUNTER — Other Ambulatory Visit: Payer: Self-pay | Admitting: *Deleted

## 2010-08-21 DIAGNOSIS — E119 Type 2 diabetes mellitus without complications: Secondary | ICD-10-CM

## 2010-08-21 NOTE — Telephone Encounter (Signed)
[facility] has pt taking Lantus - inject 42 units and Novolg inject 3 units 15 mins before lunch.

## 2010-08-22 MED ORDER — INSULIN GLARGINE 100 UNIT/ML ~~LOC~~ SOLN: 50.0000 [IU] | Freq: Every day | SUBCUTANEOUS | Status: DC

## 2010-08-22 MED ORDER — "INSULIN SYRINGE 30G X 1/2"" 1 ML MISC": 1.0000 | Freq: Two times a day (BID) | Status: DC

## 2010-08-22 MED ORDER — INSULIN ASPART 100 UNIT/ML ~~LOC~~ SOLN: SUBCUTANEOUS | Status: DC

## 2010-08-23 ENCOUNTER — Encounter: Payer: Self-pay | Admitting: Internal Medicine

## 2010-08-23 ENCOUNTER — Telehealth: Payer: Self-pay | Admitting: Cardiology

## 2010-08-23 DIAGNOSIS — E119 Type 2 diabetes mellitus without complications: Secondary | ICD-10-CM

## 2010-08-23 MED ORDER — INSULIN ASPART 100 UNIT/ML ~~LOC~~ SOLN: SUBCUTANEOUS | Status: DC

## 2010-08-23 NOTE — Telephone Encounter (Signed)
Spoke with pt, she had an episode of severe pain in chest last night. She took 2 tums and drank mylanta and eventually the pain moved down into the top of her stomach. She has a hx of several stomach problems and they changed her to dexilant and she feels it is not helping. She is belching and has a lot of gas and feels this is related to that. She is getting gas-x to try. The pain is still there and is located under her left breast and occ will become sharpe. Told pt it sounded like stomach pain. She will go to the ER if her symptoms change or she gets worried. She has a follow up with dr Stanford Breed 09-09-10. She will call back with further problems Fredia Beets

## 2010-08-23 NOTE — Telephone Encounter (Signed)
Pt had an episode and wants to talk to nurse

## 2010-08-26 ENCOUNTER — Other Ambulatory Visit (HOSPITAL_COMMUNITY): Payer: Self-pay | Admitting: Gastroenterology

## 2010-08-27 ENCOUNTER — Other Ambulatory Visit: Payer: Self-pay | Admitting: *Deleted

## 2010-08-27 DIAGNOSIS — J449 Chronic obstructive pulmonary disease, unspecified: Secondary | ICD-10-CM

## 2010-08-27 DIAGNOSIS — R252 Cramp and spasm: Secondary | ICD-10-CM

## 2010-08-27 DIAGNOSIS — D649 Anemia, unspecified: Secondary | ICD-10-CM

## 2010-08-27 DIAGNOSIS — E119 Type 2 diabetes mellitus without complications: Secondary | ICD-10-CM

## 2010-08-27 DIAGNOSIS — I10 Essential (primary) hypertension: Secondary | ICD-10-CM

## 2010-08-27 DIAGNOSIS — K219 Gastro-esophageal reflux disease without esophagitis: Secondary | ICD-10-CM

## 2010-08-27 DIAGNOSIS — F419 Anxiety disorder, unspecified: Secondary | ICD-10-CM

## 2010-08-29 ENCOUNTER — Encounter: Payer: Self-pay | Admitting: Internal Medicine

## 2010-08-29 ENCOUNTER — Ambulatory Visit (INDEPENDENT_AMBULATORY_CARE_PROVIDER_SITE_OTHER): Payer: Medicare Other | Admitting: Internal Medicine

## 2010-08-29 VITALS — BP 104/62 | HR 101 | Temp 97.1°F | Ht 62.0 in | Wt 178.6 lb

## 2010-08-29 DIAGNOSIS — E118 Type 2 diabetes mellitus with unspecified complications: Secondary | ICD-10-CM | POA: Insufficient documentation

## 2010-08-29 DIAGNOSIS — Z8669 Personal history of other diseases of the nervous system and sense organs: Secondary | ICD-10-CM | POA: Insufficient documentation

## 2010-08-29 DIAGNOSIS — E1149 Type 2 diabetes mellitus with other diabetic neurological complication: Secondary | ICD-10-CM

## 2010-08-29 DIAGNOSIS — I739 Peripheral vascular disease, unspecified: Secondary | ICD-10-CM

## 2010-08-29 DIAGNOSIS — I340 Nonrheumatic mitral (valve) insufficiency: Secondary | ICD-10-CM | POA: Insufficient documentation

## 2010-08-29 DIAGNOSIS — I6521 Occlusion and stenosis of right carotid artery: Secondary | ICD-10-CM | POA: Insufficient documentation

## 2010-08-29 DIAGNOSIS — M797 Fibromyalgia: Secondary | ICD-10-CM

## 2010-08-29 DIAGNOSIS — I10 Essential (primary) hypertension: Secondary | ICD-10-CM

## 2010-08-29 DIAGNOSIS — IMO0001 Reserved for inherently not codable concepts without codable children: Secondary | ICD-10-CM

## 2010-08-29 DIAGNOSIS — I808 Phlebitis and thrombophlebitis of other sites: Secondary | ICD-10-CM

## 2010-08-29 DIAGNOSIS — Z720 Tobacco use: Secondary | ICD-10-CM

## 2010-08-29 DIAGNOSIS — G609 Hereditary and idiopathic neuropathy, unspecified: Secondary | ICD-10-CM

## 2010-08-29 DIAGNOSIS — I2722 Pulmonary hypertension due to left heart disease: Secondary | ICD-10-CM | POA: Insufficient documentation

## 2010-08-29 DIAGNOSIS — J439 Emphysema, unspecified: Secondary | ICD-10-CM | POA: Insufficient documentation

## 2010-08-29 DIAGNOSIS — M810 Age-related osteoporosis without current pathological fracture: Secondary | ICD-10-CM | POA: Insufficient documentation

## 2010-08-29 DIAGNOSIS — E1165 Type 2 diabetes mellitus with hyperglycemia: Secondary | ICD-10-CM

## 2010-08-29 DIAGNOSIS — K219 Gastro-esophageal reflux disease without esophagitis: Secondary | ICD-10-CM | POA: Insufficient documentation

## 2010-08-29 DIAGNOSIS — M8000XD Age-related osteoporosis with current pathological fracture, unspecified site, subsequent encounter for fracture with routine healing: Secondary | ICD-10-CM | POA: Insufficient documentation

## 2010-08-29 DIAGNOSIS — E1169 Type 2 diabetes mellitus with other specified complication: Secondary | ICD-10-CM | POA: Insufficient documentation

## 2010-08-29 DIAGNOSIS — D649 Anemia, unspecified: Secondary | ICD-10-CM | POA: Insufficient documentation

## 2010-08-29 DIAGNOSIS — Z0000 Encounter for general adult medical examination without abnormal findings: Secondary | ICD-10-CM

## 2010-08-29 DIAGNOSIS — R252 Cramp and spasm: Secondary | ICD-10-CM

## 2010-08-29 DIAGNOSIS — G4733 Obstructive sleep apnea (adult) (pediatric): Secondary | ICD-10-CM | POA: Insufficient documentation

## 2010-08-29 DIAGNOSIS — E1142 Type 2 diabetes mellitus with diabetic polyneuropathy: Secondary | ICD-10-CM

## 2010-08-29 DIAGNOSIS — E1140 Type 2 diabetes mellitus with diabetic neuropathy, unspecified: Secondary | ICD-10-CM | POA: Insufficient documentation

## 2010-08-29 DIAGNOSIS — F17200 Nicotine dependence, unspecified, uncomplicated: Secondary | ICD-10-CM

## 2010-08-29 DIAGNOSIS — I050 Rheumatic mitral stenosis: Secondary | ICD-10-CM | POA: Insufficient documentation

## 2010-08-29 DIAGNOSIS — IMO0002 Reserved for concepts with insufficient information to code with codable children: Secondary | ICD-10-CM

## 2010-08-29 HISTORY — DX: Fibromyalgia: M79.7

## 2010-08-29 LAB — GLUCOSE, CAPILLARY: Glucose-Capillary: 150 mg/dL — ABNORMAL HIGH (ref 70–99)

## 2010-08-29 MED ORDER — GABAPENTIN 300 MG PO CAPS: 300.0000 mg | ORAL_CAPSULE | Freq: Two times a day (BID) | ORAL | Status: DC

## 2010-08-29 MED ORDER — INSULIN GLARGINE 100 UNIT/ML ~~LOC~~ SOLN: 55.0000 [IU] | Freq: Every day | SUBCUTANEOUS | Status: DC

## 2010-08-29 NOTE — Assessment & Plan Note (Signed)
Continues not to be at goal. Glucometer logs were reviewed today with the patient, and it seems that blood sugars are remaining elevated throughout the day. Therefore she is likely not an adequate basal insulin dosage. - Will escalate Lantus to 55 units. Patient instructed that if she has blood sugars less than 70, she should call the clinic, and the escalate her Lantus to 53 units. - Recommend to continue checking blood sugars 3 times daily, and to again bring glucometer to next clinic visit for review. - Consider adding mealtime coverage at a standard dosage, as her sliding scale insulin needs are consistently over 4-6 units with each meal. Will reevaluate at next visit after her basal regimen has been reviewed.

## 2010-08-29 NOTE — Assessment & Plan Note (Signed)
Continue to encourage smoking cessation, advised of tips including selecting quit date, chewing gum, using nicotine lozenges. Patient indicates she is still not ready to stop smoking however will continue to think about it.

## 2010-08-29 NOTE — Assessment & Plan Note (Signed)
Well-controlled.  Continue current regimen. 

## 2010-08-29 NOTE — Assessment & Plan Note (Signed)
Continue pain medications at this time. If persistently having muscle cramping, will consider holding her statin therapy to see if this may be contributing towards her symptoms.

## 2010-08-29 NOTE — Assessment & Plan Note (Signed)
Left arm without obvious warmth, tenderness, erythema, as was previously experienced during the hospital course. Also no evidence of clot on exam. - Recommended to continue warm compress as needed, and keeping the arm elevated as needed. - Recommended to return to clinic if she is developing fevers, chills, worsening of arm pain.

## 2010-08-29 NOTE — Assessment & Plan Note (Addendum)
Symptoms of burning in BL feet, hyperesthesias in feet may represent diabetic peripheral neuropathy. B12 wnl in 2009. - Will low dose neurontin, just at a 330m BID dosing, can decrease to once daily dosing if causing sleepiness during the day.

## 2010-08-29 NOTE — Patient Instructions (Addendum)
   Please follow-up at the clinic in 1 month, at which time we will reevaluate your diabetes and blood pressure.  You have been started on Gabapentin for your feet and if you develop throat closing, tongue swelling, rash, please stop the medication and call the clinic at [phone] and go to the ER.  If you are diabetic, please bring your meter to your next visit.  If you are feeling too sleepy with the Gabapentin, you can take it only once daily at bedtime.   If symptoms worsen, or new symptoms arise, please call the clinic or go to the ER.  Please bring all of your medications in a bag to your next visit.

## 2010-08-29 NOTE — Progress Notes (Signed)
Subjective:    Patient ID: [name], female    DOB: 30-Jan-1951, 60 y.o.   MRN: [id number]  HPI Pt is a 60 y.o. female who  has a past medical history of Depression; GERD, HTN, HLD, DMII, COPD with ongoing tobacco abuse, fibromyalgia, mitral stenosis and presents to clinic today for the following:  1) HFU - Patient was hospitalized at [facility] from 06/23-06/27/2012 for evaluation of gait instability, which was determined to be likely autonomic instability in the setting of severe peripheral vascular disease, diabetes mellitus, tobacco abuse. Stroke was ruled out with MRI.  Since hospital discharge, patient has not had recurrence of gait instability, although occassionally feels dizzy with positional changes, although she has learned to do positional changes slowly or sit down. She has since followed-up with her cardiologist who recommended TEE to further evaluate her mitral stenosis. She will soon have follow-up with Dr. Stanford Breed on 07/30 to discuss results at which time it will be determined if she needs surgery. Denies recent falls, persistent dizziness, gait instability.  2) DMII, uncontrolled HgA1c 9.3 - Patient checking blood sugars 3 times daily, before breakfast, lunch, and dinner. Glucometer logs reviewed with preprandial blood sugars of 200-300s mg/dL. Currently taking Lantus 50 units and SSI Novolog (4-6 units of novolog TID). 0 hypoglycemic episodes since last visit. Denies vision changes, polyuria, polydipsia.   3) HTN - Patient does not check blood pressure regularly at home. Currently taking Lisnopril-HCTZ 10-12.33m. Denies headaches, lightheadedness, chest pain, shortness of breath. Occasionally having positional dizziness.   4) Diffuse body pains - patient has known fibromyalgia. Has had BL arm, legs, feet pains, achy in character. Not involving joints, and remains without joint swelling, redness, or warmth, has been ongoing for years. Aggravated with movements, alleviated with rest.  Has had normal B12 levels in 03/2007. Vicodin helps body pains and is stable. Pain is especially aggravated on BL feet with burning sensation of feet almost throughout the day, makes it difficult to ambulate.   5) Left arm thrombophlebitis - patient had thrombophlebitis noted during hospital course, with doppler ultrasounds negative for evidence of DVT. She was therefore recommended to continue warm compresses and arm elevation. With these recommendation, patient has had resolution of her left arm erythema, warmth, and tenderness. Has also remained without fevers, chills, localized swelling.  6) Left foot trauma - patient notes that a few weeks ago, a light bulb broke and a small amount of glass pierced the sole of her left foot, she was able to remove this piece of glass a few days ago, with only a small remaining healed subcentimeter lesion. Denies localized redness, discharge, warmth, only mild tenderness remaining.   7)Tobacco abuse - patient continues to smoke. States she is occasionally feeling short of breath, requiring her albuterol, but that occurs rarely. She is not yet ready to quit, although she knows it will be best for her health.   Review of Systems Per HPI.  Current Outpatient Medications Medication Sig  . albuterol (PROVENTIL,VENTOLIN) 90 MCG/ACT inhaler Inhale 1-2 puffs into the lungs every 4 (four) hours as needed.    . ALPRAZolam (XANAX) 1 MG tablet Take 1 tablet (1 mg total) by mouth 3 (three) times daily as needed for sleep or anxiety.  .Marland Kitchenaspirin 81 MG tablet Take 81 mg by mouth daily.    .Marland KitchenbuPROPion (WELLBUTRIN XL) 150 MG 24 hr tablet Take 1 tablet (150 mg total) by mouth daily.  .Marland Kitchendexlansoprazole (DEXILANT) 60 MG capsule Take 60 mg by  mouth daily.    Marland Kitchen docusate sodium (COLACE) 100 MG capsule Take 1 capsule (100 mg total) by mouth 2 (two) times daily.  . DULoxetine (CYMBALTA) 30 MG capsule Take 1 capsule (30 mg total) by mouth daily. Take 3 tablets by mouth once daily  .  Fluticasone-Salmeterol (ADVAIR DISKUS) 250-50 MCG/DOSE AEPB Inhale 1 puff into the lungs every 12 (twelve) hours.    Marland Kitchen glucose blood test strip 1 each by Other route as needed. Use as instructed   . HYDROcodone-acetaminophen (NORCO) 10-325 MG per tablet Take 1 tablet 4 times a day as needed for pain  . insulin aspart (NOVOLOG) 100 UNIT/ML injection 3 units to cover 3 carb choices each meal 3x a day. Add correction amount of Novolog to food Novolog if needed up to a total of 30 units per day: If blood sugar is: < 150= no correction, if 151-200 add 1 unit, if 201-250 add 2 units, if 251-300 add 3 units, if 301-350 add 4 units, if 351-400 add 5 units, if 401- 450 add 6 units, if > 450 please call office for instructions.  . Insulin Syringe-Needle U-100 (INSULIN SYRINGE .3CC/31GX5/16") 31G X 5/16" 0.3 ML MISC 1 Syringe by Does not apply route 2 (two) times daily at 10 AM and 5 PM.  . Insulin Syringe-Needle U-100 (INSULIN SYRINGE 1CC/30GX1/2") 30G X 1/2" 1 ML MISC 1 Container by Does not apply route 2 (two) times daily.  Marland Kitchen lisinopril-hydrochlorothiazide (PRINZIDE,ZESTORETIC) 10-12.5 MG per tablet TAKE 1 TABLET BY MOUTH DAILY  . loratadine (CLARITIN) 10 MG tablet Take 1 tablet (10 mg total) by mouth daily.  . RABEprazole (ACIPHEX) 20 MG tablet Take 20 mg by mouth daily.    . rosuvastatin (CRESTOR) 20 MG tablet Take 1 tablet (20 mg total) by mouth daily.  Marland Kitchen tiotropium (SPIRIVA) 18 MCG inhalation capsule Place 1 capsule (18 mcg total) into inhaler and inhale daily.  . insulin glargine (LANTUS) 100 UNIT/ML injection Inject 50 Units into the skin at bedtime.  Marland Kitchen nystatin (MYCOSTATIN) cream Apply topically as needed.      Allergies Morphine; Percocet; and Tramadol hcl  Past Medical History  Diagnosis Date  . Depression   . GERD (gastroesophageal reflux disease)     Followed by [name] // small bowel capsule endoscopy (11/2009) - minimal chronic inflammation, otherwise negative for  H.pylori, metaplasia,  dysplasia  . Hypertension   . Hyperlipidemia   . Diabetes mellitus type II, uncontrolled     on insulin therapy  . COPD (chronic obstructive pulmonary disease) 2008    not on home O2 // her CT of the chest there was no evidence of primary carcinoid tumor , scattered normal-sized mediastinal and hilar as well as axillary lymph nodes noted., no significant lymphadenopathy. COPD and emphysema with no acute cardiopulmonary disease noted , mild cardiomegaly and solitary calcified  plaque  . Moderate to severe pulmonary hypertension     severe per TEE (08/2010) - Peak RV-RA gradient 4m Hg  . Mitral regurgitation     mild to moderate per TEE (08/2010)  . Shingles   . Anemia     Anemia panel (08/2009) - iron 21, TIBC 441, UIBC 420,  ferritin 11 // Pt has been unable to tolerate iron supplementation due to chronic abdominal discomfort, source of which still is not clear.  .Marland KitchenHistory of colonic polyps     Colonoscopy (02/2002) - 2 polyps noted, 1 - adenomatous, 2 - hyperplastic // Followed by Dr. [name] . Anxiety   .  IBS (irritable bowel syndrome)   . Osteoporosis     DEXA (12/2004) - L-spine T score -2.6, Left hip -0.1  . Fibromyalgia   . Obstructive sleep apnea     on CPAP. // Sleep study (06/2009) - Moderate sleep apnea/ hypopnea syndrome , AHI 17.8 per hour with nonpositional hypopneas. CPAP titration to 12 CWP, AHI 2.4 per hour. Small resMed Quattro full-face mask with heated humidifier  . Restless leg syndrome   . Mitral stenosis     moderate per TEE (08/2010) // Followed at [facility], Dr. Stanford Breed  . Bilateral carotid artery stenosis     s/p right endarterectomy (06/2010). // Carotid US (07/2010) -  60%-79% ICA stenosis, mid range of scale by velocity.  Left: Moderate-to-severe calcific and non-calcific plaque origin andproximal ICA and ECA.  60%-79% ICA stenosis, highest end of scale // Followed by [name].    Past Surgical History  Procedure Date  . Orif clavicle fracture  01/2004    by Thana Farr. [name], M.D for Right clavicle nonunion.  . Tonsillectomy   . Carotid endarterectomy 07/04/2010    right, by [name] for asymptomatic right carotid artery stenosis  . Lipoma exision 08/2005    occipital lipoma 1.5cm - by Dr. [name]  . Hysteroscopy with endometrial ablation 06/2001    for persistent post-menopausal bleeding // by S. [name], M.D.       Objective:   Physical Exam General: Vital signs reviewed and noted. Well-developed, well-nourished, in no acute distress; alert, appropriate and cooperative throughout examination.  Head: Normocephalic, atraumatic.  Neck: No deformities, masses, or tenderness noted. Healed scar on lateral neck from recent carotid endarterectomy.  Lungs:  Normal respiratory effort. Clear to auscultation BL without crackles or wheezes.  Heart: RRR. S1 and S2 normal without gallop, rubs. (+) systolic murmur.  Abdomen:  BS normoactive. Soft, Nondistended, non-tender.  No masses or organomegaly.  Extremities: No pretibial edema. LE - Monofilament testing reveals diffuse hyperesthesias of BL feet. Dry skin. Sole of left foot with small indentation on lateral edge from where glass recently pierced skin, no surrounding erythema, warmth, no discharge appreciated, no open lesions. UE - left arm without warmth, erythema, tenderness to palpation previously experienced just above antecubital fossa.       Assessment & Plan:  Case and plan of care discussed with Dr. Desiree Hane.

## 2010-08-30 MED ORDER — ALPRAZOLAM 1 MG PO TABS: 1.0000 mg | ORAL_TABLET | Freq: Three times a day (TID) | ORAL | Status: DC | PRN

## 2010-08-30 NOTE — Telephone Encounter (Signed)
Rx called in 

## 2010-09-02 ENCOUNTER — Ambulatory Visit (HOSPITAL_COMMUNITY)
Admission: RE | Admit: 2010-09-02 | Discharge: 2010-09-02 | Disposition: A | Discharge status: Home / Self Care | Payer: Medicare Other | Source: Ambulatory Visit | Attending: Gastroenterology | Admitting: Gastroenterology

## 2010-09-02 ENCOUNTER — Encounter: Payer: Self-pay | Admitting: *Deleted

## 2010-09-02 DIAGNOSIS — R109 Unspecified abdominal pain: Secondary | ICD-10-CM | POA: Insufficient documentation

## 2010-09-02 NOTE — Progress Notes (Signed)
i spoke to [name]., she does not normally do these, the form is in your mailbox

## 2010-09-02 NOTE — Progress Notes (Signed)
Let me know if there is anything you need me to do, please.  [name], D.O.

## 2010-09-02 NOTE — Progress Notes (Unsigned)
Pt presents requesting form for DMV renewal of handicap sticker be completed, form is given to donnat.

## 2010-09-09 ENCOUNTER — Encounter: Payer: Self-pay | Admitting: Cardiology

## 2010-09-09 ENCOUNTER — Other Ambulatory Visit (INDEPENDENT_AMBULATORY_CARE_PROVIDER_SITE_OTHER): Payer: Medicare Other | Admitting: *Deleted

## 2010-09-09 ENCOUNTER — Ambulatory Visit (INDEPENDENT_AMBULATORY_CARE_PROVIDER_SITE_OTHER): Payer: Medicare Other | Admitting: Cardiology

## 2010-09-09 ENCOUNTER — Encounter: Payer: Self-pay | Admitting: *Deleted

## 2010-09-09 DIAGNOSIS — R0602 Shortness of breath: Secondary | ICD-10-CM

## 2010-09-09 DIAGNOSIS — I10 Essential (primary) hypertension: Secondary | ICD-10-CM

## 2010-09-09 DIAGNOSIS — J449 Chronic obstructive pulmonary disease, unspecified: Secondary | ICD-10-CM

## 2010-09-09 DIAGNOSIS — E785 Hyperlipidemia, unspecified: Secondary | ICD-10-CM

## 2010-09-09 DIAGNOSIS — I050 Rheumatic mitral stenosis: Secondary | ICD-10-CM

## 2010-09-09 DIAGNOSIS — Z720 Tobacco use: Secondary | ICD-10-CM

## 2010-09-09 DIAGNOSIS — I059 Rheumatic mitral valve disease, unspecified: Secondary | ICD-10-CM

## 2010-09-09 DIAGNOSIS — Z01810 Encounter for preprocedural cardiovascular examination: Secondary | ICD-10-CM

## 2010-09-09 DIAGNOSIS — I679 Cerebrovascular disease, unspecified: Secondary | ICD-10-CM

## 2010-09-09 DIAGNOSIS — J4489 Other specified chronic obstructive pulmonary disease: Secondary | ICD-10-CM

## 2010-09-09 NOTE — Assessment & Plan Note (Signed)
Check pulmonary function tests.

## 2010-09-09 NOTE — Assessment & Plan Note (Signed)
Patient counseled on discontinuing. 

## 2010-09-09 NOTE — Patient Instructions (Signed)
Your physician has requested that you have a cardiac catheterization. Cardiac catheterization is used to diagnose and/or treat various heart conditions. Doctors may recommend this procedure for a number of different reasons. The most common reason is to evaluate chest pain. Chest pain can be a symptom of coronary artery disease (CAD), and cardiac catheterization can show whether plaque is narrowing or blocking your heart's arteries. This procedure is also used to evaluate the valves, as well as measure the blood flow and oxygen levels in different parts of your heart. For further information please visit [email or web address]. Please follow instruction sheet, as given.   Your physician has recommended that you have a pulmonary function test. Pulmonary Function Tests are a group of tests that measure how well air moves in and out of your lungs.   Your physician recommends that you schedule a follow-up appointment in: 6 WEEKS

## 2010-09-09 NOTE — Assessment & Plan Note (Signed)
Continue aspirin and statin. Now followed by vascular surgery status post carotid endarterectomy.

## 2010-09-09 NOTE — Assessment & Plan Note (Signed)
Continue statin. 

## 2010-09-09 NOTE — Assessment & Plan Note (Addendum)
Patient'sTEE reveals normal LV function. There is mild MS by pressure half-time but mean gradient suggests severe MS. There is mild to moderate mitral regurgitation. Patient's pulmonary pressures are severely elevated. This may be related to COPD but there may also be a contribution from mitral stenosis. Plan right and left heart catheterization to exclude coronary disease, evaluate pulmonary pressures and evaluate severity of mitral stenosis. I think she will most likely require mitral valve replacement. Check pulmonary function tests for severity of pulmonary disease. Risks and benefits of cardiac catheterization were discussed and patient agrees to proceed.

## 2010-09-09 NOTE — Assessment & Plan Note (Signed)
Blood pressure controlled. Continue present medications.

## 2010-09-09 NOTE — Progress Notes (Signed)
HPI:60 year old female I saw in April of 2012 for evaluation of chest pain. Previously seen by Dr. Domenic Polite. Echocardiogram in May of 2012 showed EF 60-65; moderate MS (mean gradient 22 mmHg; MVA by pressure half time 2.2; overall felt most likely moderate). The findings are consistent with moderate stenosis; moderate MR; Mild-moderate tricuspid regurgitation. Systolic pressure was severely increased. PA peak pressure: 53m Hg (S). Myoview in May 2012 showed an ejection fraction of 81%, probable soft tissue attenuation versus prior infarct and increased uptake in the right ventricle suggestive of elevated pulmonary pressures. ABIs in May 2012 were normal. Carotid Dopplers in May 2012 showed an 80-99% right and a 60-79% left stenosis. Patient had right carotid endarterectomy and is now followed by vascular surgery. TEE performed 7/12 revealed normal LV function, mild mitral stenosis by pressure half-time with a valve area of 1.75 cmsquare but mean gradient of 16 mm mercury suggesting severe mitral stenosis, mild to moderate mitral regurgitation, moderate left atrial enlargement, mild TR and severely elevated pulmonary pressures. Since I last saw her, she continues to have dyspnea on exertion.there is no orthopnea, PND, pedal edema, palpitations, syncope or exertional chest pain. She does have indigestion and is being evaluated by gastroenterology.   Current Outpatient Prescriptions  Medication Sig Dispense Refill  . albuterol (PROVENTIL,VENTOLIN) 90 MCG/ACT inhaler Inhale 1-2 puffs into the lungs every 4 (four) hours as needed.        . ALPRAZolam (XANAX) 1 MG tablet Take 1 tablet (1 mg total) by mouth 3 (three) times daily as needed for sleep or anxiety.  90 tablet  1  . aspirin 81 MG tablet Take 81 mg by mouth daily.        .Marland KitchenbuPROPion (WELLBUTRIN XL) 150 MG 24 hr tablet Take 1 tablet (150 mg total) by mouth daily.  30 tablet  0  . docusate sodium (COLACE) 100 MG capsule Take 1 capsule (100 mg total) by  mouth 2 (two) times daily.  60 capsule  7  . DULoxetine (CYMBALTA) 30 MG capsule Take 1 capsule (30 mg total) by mouth daily. Take 3 tablets by mouth once daily  90 capsule  7  . Fluticasone-Salmeterol (ADVAIR DISKUS) 250-50 MCG/DOSE AEPB Inhale 1 puff into the lungs every 12 (twelve) hours.        . gabapentin (NEURONTIN) 300 MG capsule Take 1 capsule (300 mg total) by mouth 2 (two) times daily.  60 capsule  2  . glucose blood test strip 1 each by Other route as needed. Use as instructed       . HYDROcodone-acetaminophen (NORCO) 10-325 MG per tablet Take 1 tablet 4 times a day as needed for pain  120 tablet  5  . insulin aspart (NOVOLOG) 100 UNIT/ML injection 3 units to cover 3 carb choices each meal 3x a day. Add correction amount of Novolog to food Novolog if needed up to a total of 30 units per day: If blood sugar is: < 150= no correction, if 151-200 add 1 unit, if 201-250 add 2 units, if 251-300 add 3 units, if 301-350 add 4 units, if 351-400 add 5 units, if 401- 450 add 6 units, if > 450 please call office for instructions.  10 mL  11  . insulin glargine (LANTUS) 100 UNIT/ML injection Inject 55 Units into the skin at bedtime.  10 mL  11  . Insulin Syringe-Needle U-100 (INSULIN SYRINGE .3CC/31GX5/16") 31G X 5/16" 0.3 ML MISC 1 Syringe by Does not apply route 2 (two) times daily at  10 AM and 5 PM.  100 each  11  . Insulin Syringe-Needle U-100 (INSULIN SYRINGE 1CC/30GX1/2") 30G X 1/2" 1 ML MISC 1 Container by Does not apply route 2 (two) times daily.  100 each  11  . lisinopril-hydrochlorothiazide (PRINZIDE,ZESTORETIC) 10-12.5 MG per tablet TAKE 1 TABLET BY MOUTH DAILY  30 tablet  PRN  . loratadine (CLARITIN) 10 MG tablet Take 1 tablet (10 mg total) by mouth daily.  30 tablet  7  . omeprazole (PRILOSEC) 40 MG capsule Take 40 mg by mouth daily.        . rosuvastatin (CRESTOR) 20 MG tablet Take 1 tablet (20 mg total) by mouth daily.  30 tablet  7  . tiotropium (SPIRIVA) 18 MCG inhalation capsule Place  1 capsule (18 mcg total) into inhaler and inhale daily.  30 capsule  7     Past Medical History  Diagnosis Date  . Depression   . GERD (gastroesophageal reflux disease)     Followed by [name] // small bowel capsule endoscopy (11/2009) - minimal chronic inflammation, otherwise negative for  H.pylori, metaplasia, dysplasia  . Hypertension   . Hyperlipidemia   . Diabetes mellitus type II, uncontrolled     on insulin therapy  . COPD (chronic obstructive pulmonary disease) 2008    not on home O2 // her CT of the chest there was no evidence of primary carcinoid tumor , scattered normal-sized mediastinal and hilar as well as axillary lymph nodes noted., no significant lymphadenopathy. COPD and emphysema with no acute cardiopulmonary disease noted , mild cardiomegaly and solitary calcified  plaque  . Moderate to severe pulmonary hypertension     severe per TEE (08/2010) - Peak RV-RA gradient 43m Hg  . Mitral regurgitation     mild to moderate per TEE (08/2010)  . Shingles   . Anemia     Anemia panel (08/2009) - iron 21, TIBC 441, UIBC 420,  ferritin 11 // Pt has been unable to tolerate iron supplementation due to chronic abdominal discomfort, source of which still is not clear.  .Marland KitchenHistory of colonic polyps     Colonoscopy (10/2005) - colon polyp with path nonadenomatous, no malignancy - by Dr. [name] // Colonoscopy (02/2002) - 2 polyps noted, 1 - adenomatous, 2 - hyperplastic // Followed by Dr. [name] . Anxiety   . IBS (irritable bowel syndrome)   . Osteoporosis     DEXA (12/2004) - L-spine T score -2.6, Left hip -0.1  . Fibromyalgia   . Obstructive sleep apnea     on CPAP. // Sleep study (06/2009) - Moderate sleep apnea/ hypopnea syndrome , AHI 17.8 per hour with nonpositional hypopneas. CPAP titration to 12 CWP, AHI 2.4 per hour. Small resMed Quattro full-face mask with heated humidifier  . Restless leg syndrome   . Mitral stenosis     moderate per TEE (08/2010) // [facility] [name] . Bilateral carotid artery stenosis     s/p right endarterectomy (06/2010). // Carotid [place](07/2010) -  60%-79% ICA stenosis, mid range of scale by velocity.  Left: Moderate-to-severe calcific and non-calcific plaque origin andproximal ICA and ECA.  60%-79% ICA stenosis, highest end of scale // Followed by [name]    Past Surgical History  Procedure Date  . Orif clavicle fracture 01/2004    by MThana Farr YLorin Mercy M.D for Right clavicle nonunion.  . Tonsillectomy   . Carotid endarterectomy 07/04/2010    right, by Dr. BTrula Sladefor asymptomatic right  carotid artery stenosis  . Lipoma exision 08/2005    occipital lipoma 1.5cm - by Dr. [name]  . Hysteroscopy with endometrial ablation 06/2001    for persistent post-menopausal bleeding // by S. [name], M.D.    History   Social History  . Marital Status: Divorced    Spouse Name: N/A    Number of Children: N/A  . Years of Education: N/A   Occupational History  . Not on file.   Social History Main Topics  . Smoking status: Current Everyday Smoker -- 1.0 packs/day for 50 years    Types: Cigarettes  . Smokeless tobacco: Not on file  . Alcohol Use: No  . Drug Use: No  . Sexually Active: No   Other Topics Concern  . Not on file   Social History Narrative  . No narrative on file    ROS: no fevers or chills, productive cough, hemoptysis, dysphasia, odynophagia, melena, hematochezia, dysuria, hematuria, rash, seizure activity, orthopnea, PND, pedal edema, claudication. Remaining systems are negative.  Physical Exam: Well-developed well-nourished in no acute distress.  Skin is warm and dry.  HEENT is normal.  Neck is supple. No thyromegaly.  Chest is clear to auscultation with normal expansion. Diminished breath sounds Cardiovascular exam is regular rate and rhythm. 2/6 systolic murmur. Abdominal exam nontender or distended. No masses palpated. Extremities show no edema. neuro grossly intact

## 2010-09-10 ENCOUNTER — Telehealth: Payer: Self-pay | Admitting: Cardiology

## 2010-09-10 LAB — CBC WITH DIFFERENTIAL/PLATELET
Basophils Absolute: 0.1 10*3/uL (ref 0.0–0.1)
Basophils Relative: 1.6 % (ref 0.0–3.0)
Eosinophils Absolute: 0.1 10*3/uL (ref 0.0–0.7)
Eosinophils Relative: 1 % (ref 0.0–5.0)
HCT: 31 % — ABNORMAL LOW (ref 36.0–46.0)
Hemoglobin: 10.1 g/dL — ABNORMAL LOW (ref 12.0–15.0)
Lymphocytes Relative: 21.7 % (ref 12.0–46.0)
Lymphs Abs: 1.8 10*3/uL (ref 0.7–4.0)
MCHC: 32.5 g/dL (ref 30.0–36.0)
MCV: 84 fl (ref 78.0–100.0)
Monocytes Absolute: 0.7 10*3/uL (ref 0.1–1.0)
Monocytes Relative: 8.1 % (ref 3.0–12.0)
Neutro Abs: 5.7 10*3/uL (ref 1.4–7.7)
Neutrophils Relative %: 67.6 % (ref 43.0–77.0)
Platelets: 209 10*3/uL (ref 150.0–400.0)
RBC: 3.69 Mil/uL — ABNORMAL LOW (ref 3.87–5.11)
RDW: 18.3 % — ABNORMAL HIGH (ref 11.5–14.6)
WBC: 8.4 10*3/uL (ref 4.5–10.5)

## 2010-09-10 LAB — BASIC METABOLIC PANEL WITH GFR
BUN: 16 mg/dL (ref 6–23)
CO2: 28 meq/L (ref 19–32)
Calcium: 9.2 mg/dL (ref 8.4–10.5)
Chloride: 104 meq/L (ref 96–112)
Creatinine, Ser: 1 mg/dL (ref 0.4–1.2)
GFR: 57.19 mL/min — ABNORMAL LOW
Glucose, Bld: 166 mg/dL — ABNORMAL HIGH (ref 70–99)
Potassium: 4.6 meq/L (ref 3.5–5.1)
Sodium: 139 meq/L (ref 135–145)

## 2010-09-10 LAB — PROTIME-INR
INR: 1.1 ratio — ABNORMAL HIGH (ref 0.8–1.0)
Prothrombin Time: 12.6 s — ABNORMAL HIGH (ref 10.2–12.4)

## 2010-09-10 NOTE — Telephone Encounter (Signed)
Has question regarding procedure on tomorrow.

## 2010-09-10 NOTE — Telephone Encounter (Signed)
Spoke with pt, questions answered [name]

## 2010-09-11 ENCOUNTER — Ambulatory Visit (HOSPITAL_COMMUNITY)
Admission: RE | Admit: 2010-09-11 | Discharge: 2010-09-11 | Disposition: A | Discharge status: Home / Self Care | Payer: Medicare Other | Source: Ambulatory Visit | Attending: Cardiovascular Disease | Admitting: Cardiovascular Disease

## 2010-09-11 ENCOUNTER — Inpatient Hospital Stay (HOSPITAL_COMMUNITY)
Admission: AD | Admit: 2010-09-11 | Discharge: 2010-10-03 | DRG: 216 | Disposition: A | Discharge status: Skilled Nursing Facility | Payer: Medicare Other | Source: Ambulatory Visit | Attending: Cardiothoracic Surgery | Admitting: Cardiothoracic Surgery

## 2010-09-11 ENCOUNTER — Inpatient Hospital Stay (HOSPITAL_BASED_OUTPATIENT_CLINIC_OR_DEPARTMENT_OTHER)
Admission: RE | Admit: 2010-09-11 | Discharge: 2010-09-11 | Disposition: A | Discharge status: Hospice | Payer: Medicare Other | Source: Ambulatory Visit | Attending: Cardiovascular Disease | Admitting: Cardiovascular Disease

## 2010-09-11 DIAGNOSIS — IMO0001 Reserved for inherently not codable concepts without codable children: Secondary | ICD-10-CM | POA: Diagnosis present

## 2010-09-11 DIAGNOSIS — J90 Pleural effusion, not elsewhere classified: Secondary | ICD-10-CM | POA: Diagnosis present

## 2010-09-11 DIAGNOSIS — I059 Rheumatic mitral valve disease, unspecified: Principal | ICD-10-CM | POA: Diagnosis present

## 2010-09-11 DIAGNOSIS — E119 Type 2 diabetes mellitus without complications: Secondary | ICD-10-CM | POA: Diagnosis present

## 2010-09-11 DIAGNOSIS — F329 Major depressive disorder, single episode, unspecified: Secondary | ICD-10-CM | POA: Insufficient documentation

## 2010-09-11 DIAGNOSIS — I4891 Unspecified atrial fibrillation: Secondary | ICD-10-CM | POA: Insufficient documentation

## 2010-09-11 DIAGNOSIS — Z01812 Encounter for preprocedural laboratory examination: Secondary | ICD-10-CM

## 2010-09-11 DIAGNOSIS — J9620 Acute and chronic respiratory failure, unspecified whether with hypoxia or hypercapnia: Secondary | ICD-10-CM | POA: Diagnosis not present

## 2010-09-11 DIAGNOSIS — I10 Essential (primary) hypertension: Secondary | ICD-10-CM | POA: Insufficient documentation

## 2010-09-11 DIAGNOSIS — F3289 Other specified depressive episodes: Secondary | ICD-10-CM | POA: Insufficient documentation

## 2010-09-11 DIAGNOSIS — R0609 Other forms of dyspnea: Secondary | ICD-10-CM | POA: Insufficient documentation

## 2010-09-11 DIAGNOSIS — E785 Hyperlipidemia, unspecified: Secondary | ICD-10-CM | POA: Diagnosis present

## 2010-09-11 DIAGNOSIS — I509 Heart failure, unspecified: Secondary | ICD-10-CM | POA: Diagnosis present

## 2010-09-11 DIAGNOSIS — J4489 Other specified chronic obstructive pulmonary disease: Secondary | ICD-10-CM | POA: Diagnosis present

## 2010-09-11 DIAGNOSIS — K219 Gastro-esophageal reflux disease without esophagitis: Secondary | ICD-10-CM | POA: Insufficient documentation

## 2010-09-11 DIAGNOSIS — I5033 Acute on chronic diastolic (congestive) heart failure: Secondary | ICD-10-CM | POA: Diagnosis not present

## 2010-09-11 DIAGNOSIS — I6529 Occlusion and stenosis of unspecified carotid artery: Secondary | ICD-10-CM | POA: Diagnosis present

## 2010-09-11 DIAGNOSIS — Z794 Long term (current) use of insulin: Secondary | ICD-10-CM

## 2010-09-11 DIAGNOSIS — I2789 Other specified pulmonary heart diseases: Secondary | ICD-10-CM | POA: Insufficient documentation

## 2010-09-11 DIAGNOSIS — E669 Obesity, unspecified: Secondary | ICD-10-CM | POA: Diagnosis present

## 2010-09-11 DIAGNOSIS — E871 Hypo-osmolality and hyponatremia: Secondary | ICD-10-CM | POA: Diagnosis not present

## 2010-09-11 DIAGNOSIS — G4733 Obstructive sleep apnea (adult) (pediatric): Secondary | ICD-10-CM | POA: Insufficient documentation

## 2010-09-11 DIAGNOSIS — J449 Chronic obstructive pulmonary disease, unspecified: Secondary | ICD-10-CM | POA: Diagnosis present

## 2010-09-11 DIAGNOSIS — R9431 Abnormal electrocardiogram [ECG] [EKG]: Secondary | ICD-10-CM | POA: Insufficient documentation

## 2010-09-11 DIAGNOSIS — D638 Anemia in other chronic diseases classified elsewhere: Secondary | ICD-10-CM | POA: Diagnosis present

## 2010-09-11 DIAGNOSIS — F17200 Nicotine dependence, unspecified, uncomplicated: Secondary | ICD-10-CM | POA: Diagnosis present

## 2010-09-11 DIAGNOSIS — J9819 Other pulmonary collapse: Secondary | ICD-10-CM | POA: Diagnosis not present

## 2010-09-11 DIAGNOSIS — R0989 Other specified symptoms and signs involving the circulatory and respiratory systems: Secondary | ICD-10-CM | POA: Insufficient documentation

## 2010-09-11 DIAGNOSIS — Z7982 Long term (current) use of aspirin: Secondary | ICD-10-CM

## 2010-09-11 DIAGNOSIS — F411 Generalized anxiety disorder: Secondary | ICD-10-CM | POA: Insufficient documentation

## 2010-09-11 HISTORY — PX: CARDIAC VALVE REPLACEMENT: SHX585

## 2010-09-11 LAB — GLUCOSE, CAPILLARY
Glucose-Capillary: 147 mg/dL — ABNORMAL HIGH (ref 70–99)
Glucose-Capillary: 176 mg/dL — ABNORMAL HIGH (ref 70–99)
Glucose-Capillary: 183 mg/dL — ABNORMAL HIGH (ref 70–99)

## 2010-09-11 LAB — CBC
HCT: 28.4 % — ABNORMAL LOW (ref 36.0–46.0)
Hemoglobin: 9.1 g/dL — ABNORMAL LOW (ref 12.0–15.0)
MCH: 26.8 pg (ref 26.0–34.0)
MCHC: 32 g/dL (ref 30.0–36.0)
MCV: 83.8 fL (ref 78.0–100.0)
Platelets: 196 10*3/uL (ref 150–400)
RBC: 3.39 MIL/uL — ABNORMAL LOW (ref 3.87–5.11)
RDW: 16.4 % — ABNORMAL HIGH (ref 11.5–15.5)
WBC: 8 10*3/uL (ref 4.0–10.5)

## 2010-09-11 LAB — COMPREHENSIVE METABOLIC PANEL
ALT: 11 U/L (ref 0–35)
AST: 9 U/L (ref 0–37)
Albumin: 3.1 g/dL — ABNORMAL LOW (ref 3.5–5.2)
Alkaline Phosphatase: 46 U/L (ref 39–117)
BUN: 18 mg/dL (ref 6–23)
CO2: 24 mEq/L (ref 19–32)
Calcium: 8.5 mg/dL (ref 8.4–10.5)
Chloride: 102 mEq/L (ref 96–112)
Creatinine, Ser: 0.85 mg/dL (ref 0.50–1.10)
GFR calc Af Amer: >60 mL/min (ref >60)
GFR calc non Af Amer: >60 mL/min (ref >60)
Glucose, Bld: 184 mg/dL — ABNORMAL HIGH (ref 70–99)
Potassium: 3.9 mEq/L (ref 3.5–5.1)
Sodium: 135 mEq/L (ref 135–145)
Total Bilirubin: 0.2 mg/dL — ABNORMAL LOW (ref 0.3–1.2)
Total Protein: 5.9 g/dL — ABNORMAL LOW (ref 6.0–8.3)

## 2010-09-11 LAB — TSH: TSH: 1.15 u[IU]/mL (ref 0.350–4.500)

## 2010-09-11 LAB — MRSA PCR SCREENING: MRSA by PCR: NEGATIVE

## 2010-09-11 LAB — APTT: aPTT: 31 seconds (ref 24–37)

## 2010-09-11 LAB — PROTIME-INR
INR: 1.09 (ref 0.00–1.49)
Prothrombin Time: 14.3 seconds (ref 11.6–15.2)

## 2010-09-11 NOTE — Cardiovascular Report (Signed)
[name], [name] NO.:  [id number]  MEDICAL RECORD NO.:  [id number]  LOCATION:                                 FACILITY:  PHYSICIAN:  Lauree Chandler, MDDATE OF BIRTH:  06/25/1950  DATE OF PROCEDURE:  09/11/2010 DATE OF DISCHARGE:                           CARDIAC CATHETERIZATION   PRIMARY CARDIOLOGIST:  Denice Bors. Stanford Breed, MD, [facility]  PROCEDURE PERFORMED: 1. Left heart catheterization. 2. Right heart catheterization. 3. Selective coronary angiography. 4. Left ventricular angiogram.  OPERATOR:  Lauree Chandler, MD  INDICATIONS:  This is a 60 year old Caucasian female with multiple medical problems including diabetes mellitus, depression, anxiety, hypertension, hyperlipidemia, GERD, COPD, pulmonary hypertension, obstructive sleep apnea, peripheral vascular disease and mitral stenosis that recently by transesophageal echocardiogram appeared to be significant.  The patient's symptom complex of dyspnea has consistent with severe mitral stenosis.  The patient is referred today for a diagnostic left heart catheterization and right heart catheterization to assess her valvular disease and also to exclude coronary artery disease.  PROCEDURE IN DETAIL:  The patient was brought to the outpatient cardiac catheterization laboratory after signing informed consent for the procedure.  The right groin was prepped and draped in sterile fashion. 1% lidocaine was used for local anesthesia.  A 4-French sheath was inserted into the right femoral artery without difficulty.  A 6-French sheath was inserted into the right femoral vein without difficulty.  A right heart catheterization was performed with a multipurpose catheter through the right femoral vein.  We then placed a pigtail catheter into the left ventricle.  We measured simultaneous pressures and the pulmonary capillary wedge pressure position and compared this to the left ventricular wave form.  We  obtained saturations from the arterial system as well as from the pulmonary artery.  We then perform selective coronary angiography with standard diagnostic catheters.  A left ventricular angiogram was performed with a pigtail catheter.  The patient tolerated the diagnostic portion of the procedure well.  The patient did have atrial fibrillation with rapid ventricular response during the catheterization with heart rates of 120 beats per minute.  At the conclusion of the case, there were no complications.  The patient was taken to the recovery area in stable condition.  HEMODYNAMIC FINDINGS:  Central aortic pressure 86/54.  Left ventricular pressure 99/13/16, right atrial pressure 22, right ventricular pressure 90/21/23, pulmonary artery pressure 94/54 with a mean of 71, pulmonary capillary wedge pressure 36.  Cardiac output 3.2 liters per minute. Cardiac index 1.8 liters per minute per meter squared.  Coronary artery saturation 39%.  Central aortic saturation 90%, mitral valve area 0.93, mean gradient 16 mmHg.  ANGIOGRAPHIC FINDINGS:  There was no angiographic evidence of coronary artery disease.  The left anterior descending arose from the left main artery.  The left main artery had no evidence of disease.  Left anterior descending had no flow-limiting lesions.  There was a large bifurcating diagonal branch with no evidence of disease.  The circumflex artery gave off several obtuse marginal branches had no evidence disease.  The right coronary artery was a large dominant vessel with no evidence of disease.  Left ventricular angiogram was performed in the RAO projection, which showed  normal left ventricular systolic function with ejection fraction of 65%.  IMPRESSION: 1. Severe mitral stenosis by transesophageal echocardiogram as well as     by hemodynamics during the catheterization today.  The patient had     severely elevated pulmonary artery pressures as well as pulmonary      capillary wedge pressure.  The mean gradient across the mitral     valve is 16 mmHg.  The mitral valve area is estimated at 0.93. 2. Atrial fibrillation with rapid ventricular response which is new     for this patient. 3. No evidence of coronary artery disease. 4. Normal left ventricular systolic function.  RECOMMENDATIONS:  We will admit the patient to a telemetry unit today because of her new onset atrial fibrillation with rapid ventricular response.  We will continue her home medications.  We will load with digoxin for rate control.  We will place a consultation with CT Surgery to discuss potential valve replacement.  I have discussed the findings with Dr. Kirk Ruths the patient's primary cardiologist.     Lauree Chandler, MD     CM/MEDQ  D:  09/11/2010  T:  09/11/2010  Job:  [id number]  cc:   Denice Bors. Stanford Breed, MD, [facility]  Electronically Signed by Lauree Chandler MD on 09/11/2010 02:44:57 PM

## 2010-09-12 ENCOUNTER — Inpatient Hospital Stay (HOSPITAL_COMMUNITY): Payer: Medicare Other

## 2010-09-12 DIAGNOSIS — I059 Rheumatic mitral valve disease, unspecified: Secondary | ICD-10-CM

## 2010-09-12 LAB — POCT I-STAT 3, VENOUS BLOOD GAS (G3P V)
Acid-Base Excess: 1 mmol/L (ref 0.0–2.0)
Acid-Base Excess: 1 mmol/L (ref 0.0–2.0)
Bicarbonate: 27.5 mEq/L — ABNORMAL HIGH (ref 20.0–24.0)
Bicarbonate: 27.9 mEq/L — ABNORMAL HIGH (ref 20.0–24.0)
O2 Saturation: 34 %
O2 Saturation: 39 %
TCO2: 29 mmol/L (ref 0–100)
TCO2: 29 mmol/L (ref 0–100)
pCO2, Ven: 52.6 mmHg — ABNORMAL HIGH (ref 45.0–50.0)
pCO2, Ven: 53.1 mmHg — ABNORMAL HIGH (ref 45.0–50.0)
pH, Ven: 7.326 — ABNORMAL HIGH (ref 7.250–7.300)
pH, Ven: 7.328 — ABNORMAL HIGH (ref 7.250–7.300)
pO2, Ven: 22 mmHg — CL (ref 30.0–45.0)
pO2, Ven: 25 mmHg — CL (ref 30.0–45.0)

## 2010-09-12 LAB — POCT I-STAT 3, ART BLOOD GAS (G3+)
Acid-base deficit: 1 mmol/L (ref 0.0–2.0)
Bicarbonate: 24.5 mEq/L — ABNORMAL HIGH (ref 20.0–24.0)
O2 Saturation: 90 %
TCO2: 26 mmol/L (ref 0–100)
pCO2 arterial: 42.9 mmHg (ref 35.0–45.0)
pH, Arterial: 7.365 (ref 7.350–7.400)
pO2, Arterial: 60 mmHg — ABNORMAL LOW (ref 80.0–100.0)

## 2010-09-12 LAB — GLUCOSE, CAPILLARY
Glucose-Capillary: 154 mg/dL — ABNORMAL HIGH (ref 70–99)
Glucose-Capillary: 165 mg/dL — ABNORMAL HIGH (ref 70–99)
Glucose-Capillary: 163 mg/dL — ABNORMAL HIGH (ref 70–99)
Glucose-Capillary: 194 mg/dL — ABNORMAL HIGH (ref 70–99)
Glucose-Capillary: 193 mg/dL — ABNORMAL HIGH (ref 70–99)

## 2010-09-13 ENCOUNTER — Inpatient Hospital Stay (HOSPITAL_COMMUNITY): Payer: Medicare Other

## 2010-09-13 LAB — CBC
HCT: 27.7 % — ABNORMAL LOW (ref 36.0–46.0)
Hemoglobin: 9 g/dL — ABNORMAL LOW (ref 12.0–15.0)
MCH: 27.1 pg (ref 26.0–34.0)
MCHC: 32.5 g/dL (ref 30.0–36.0)
MCV: 83.4 fL (ref 78.0–100.0)
Platelets: 186 10*3/uL (ref 150–400)
RBC: 3.32 MIL/uL — ABNORMAL LOW (ref 3.87–5.11)
RDW: 16.7 % — ABNORMAL HIGH (ref 11.5–15.5)
WBC: 8.1 10*3/uL (ref 4.0–10.5)

## 2010-09-13 LAB — BASIC METABOLIC PANEL
BUN: 15 mg/dL (ref 6–23)
CO2: 27 mEq/L (ref 19–32)
Calcium: 9.2 mg/dL (ref 8.4–10.5)
Chloride: 102 mEq/L (ref 96–112)
Creatinine, Ser: 0.83 mg/dL (ref 0.50–1.10)
GFR calc Af Amer: >60 mL/min (ref >60)
GFR calc non Af Amer: >60 mL/min (ref >60)
Glucose, Bld: 157 mg/dL — ABNORMAL HIGH (ref 70–99)
Potassium: 4 mEq/L (ref 3.5–5.1)
Sodium: 137 mEq/L (ref 135–145)

## 2010-09-13 LAB — HEMOGLOBIN A1C
Hgb A1c MFr Bld: 8.1 % — ABNORMAL HIGH (ref <5.7)
Mean Plasma Glucose: 186 mg/dL — ABNORMAL HIGH (ref <117)

## 2010-09-13 LAB — GLUCOSE, CAPILLARY
Glucose-Capillary: 181 mg/dL — ABNORMAL HIGH (ref 70–99)
Glucose-Capillary: 272 mg/dL — ABNORMAL HIGH (ref 70–99)
Glucose-Capillary: 210 mg/dL — ABNORMAL HIGH (ref 70–99)
Glucose-Capillary: 185 mg/dL — ABNORMAL HIGH (ref 70–99)

## 2010-09-14 LAB — GLUCOSE, CAPILLARY
Glucose-Capillary: 203 mg/dL — ABNORMAL HIGH (ref 70–99)
Glucose-Capillary: 231 mg/dL — ABNORMAL HIGH (ref 70–99)
Glucose-Capillary: 247 mg/dL — ABNORMAL HIGH (ref 70–99)
Glucose-Capillary: 217 mg/dL — ABNORMAL HIGH (ref 70–99)

## 2010-09-14 LAB — BASIC METABOLIC PANEL
BUN: 17 mg/dL (ref 6–23)
CO2: 26 mEq/L (ref 19–32)
Calcium: 9 mg/dL (ref 8.4–10.5)
Chloride: 99 mEq/L (ref 96–112)
Creatinine, Ser: 0.84 mg/dL (ref 0.50–1.10)
GFR calc Af Amer: >60 mL/min (ref >60)
GFR calc non Af Amer: >60 mL/min (ref >60)
Glucose, Bld: 178 mg/dL — ABNORMAL HIGH (ref 70–99)
Potassium: 4.2 mEq/L (ref 3.5–5.1)
Sodium: 134 mEq/L — ABNORMAL LOW (ref 135–145)

## 2010-09-15 ENCOUNTER — Inpatient Hospital Stay (HOSPITAL_COMMUNITY): Payer: Medicare Other

## 2010-09-15 DIAGNOSIS — I059 Rheumatic mitral valve disease, unspecified: Secondary | ICD-10-CM

## 2010-09-15 LAB — GLUCOSE, CAPILLARY
Glucose-Capillary: 167 mg/dL — ABNORMAL HIGH (ref 70–99)
Glucose-Capillary: 222 mg/dL — ABNORMAL HIGH (ref 70–99)
Glucose-Capillary: 213 mg/dL — ABNORMAL HIGH (ref 70–99)

## 2010-09-16 ENCOUNTER — Inpatient Hospital Stay (HOSPITAL_COMMUNITY): Payer: Medicare Other

## 2010-09-16 DIAGNOSIS — I050 Rheumatic mitral stenosis: Secondary | ICD-10-CM

## 2010-09-16 DIAGNOSIS — J9600 Acute respiratory failure, unspecified whether with hypoxia or hypercapnia: Secondary | ICD-10-CM

## 2010-09-16 DIAGNOSIS — J449 Chronic obstructive pulmonary disease, unspecified: Secondary | ICD-10-CM

## 2010-09-16 DIAGNOSIS — J4489 Other specified chronic obstructive pulmonary disease: Secondary | ICD-10-CM

## 2010-09-16 LAB — PRO B NATRIURETIC PEPTIDE: Pro B Natriuretic peptide (BNP): 2921 pg/mL — ABNORMAL HIGH (ref 0–125)

## 2010-09-16 LAB — CBC
HCT: 24.2 % — ABNORMAL LOW (ref 36.0–46.0)
Hemoglobin: 7.6 g/dL — ABNORMAL LOW (ref 12.0–15.0)
MCH: 26.5 pg (ref 26.0–34.0)
MCHC: 31.4 g/dL (ref 30.0–36.0)
MCV: 84.3 fL (ref 78.0–100.0)
Platelets: 180 10*3/uL (ref 150–400)
RBC: 2.87 MIL/uL — ABNORMAL LOW (ref 3.87–5.11)
RDW: 17.5 % — ABNORMAL HIGH (ref 11.5–15.5)
WBC: 8.3 10*3/uL (ref 4.0–10.5)

## 2010-09-16 LAB — POCT I-STAT 3, ART BLOOD GAS (G3+)
Acid-Base Excess: 3 mmol/L — ABNORMAL HIGH (ref 0.0–2.0)
Bicarbonate: 28.2 mEq/L — ABNORMAL HIGH (ref 20.0–24.0)
O2 Saturation: 97 %
Patient temperature: 98.6
TCO2: 30 mmol/L (ref 0–100)
pCO2 arterial: 46 mmHg — ABNORMAL HIGH (ref 35.0–45.0)
pH, Arterial: 7.394 (ref 7.350–7.400)
pO2, Arterial: 91 mmHg (ref 80.0–100.0)

## 2010-09-16 LAB — BASIC METABOLIC PANEL
BUN: 24 mg/dL — ABNORMAL HIGH (ref 6–23)
CO2: 28 mEq/L (ref 19–32)
Calcium: 8.8 mg/dL (ref 8.4–10.5)
Chloride: 95 mEq/L — ABNORMAL LOW (ref 96–112)
Creatinine, Ser: 0.99 mg/dL (ref 0.50–1.10)
GFR calc Af Amer: >60 mL/min (ref >60)
GFR calc non Af Amer: 57 mL/min — ABNORMAL LOW (ref >60)
Glucose, Bld: 175 mg/dL — ABNORMAL HIGH (ref 70–99)
Potassium: 3.9 mEq/L (ref 3.5–5.1)
Sodium: 132 mEq/L — ABNORMAL LOW (ref 135–145)

## 2010-09-16 LAB — BLOOD GAS, ARTERIAL
Acid-Base Excess: 2.7 mmol/L — ABNORMAL HIGH (ref 0.0–2.0)
Bicarbonate: 27.1 mEq/L — ABNORMAL HIGH (ref 20.0–24.0)
Drawn by: 275531
Expiratory PAP: 4
FIO2: 0.4 %
Inspiratory PAP: 8
Mode: POSITIVE
O2 Saturation: 100 %
Patient temperature: 98.6
TCO2: 28.5 mmol/L (ref 0–100)
pCO2 arterial: 44.9 mmHg (ref 35.0–45.0)
pH, Arterial: 7.398 (ref 7.350–7.400)
pO2, Arterial: 153 mmHg — ABNORMAL HIGH (ref 80.0–100.0)

## 2010-09-16 LAB — GLUCOSE, CAPILLARY
Glucose-Capillary: 180 mg/dL — ABNORMAL HIGH (ref 70–99)
Glucose-Capillary: 158 mg/dL — ABNORMAL HIGH (ref 70–99)
Glucose-Capillary: 173 mg/dL — ABNORMAL HIGH (ref 70–99)
Glucose-Capillary: 161 mg/dL — ABNORMAL HIGH (ref 70–99)

## 2010-09-16 MED ORDER — IOHEXOL 300 MG/ML  SOLN
80.0000 mL | Freq: Once | INTRAMUSCULAR | Status: AC | PRN
  Administered 2010-09-16: 80 mL via INTRAVENOUS

## 2010-09-17 ENCOUNTER — Inpatient Hospital Stay (HOSPITAL_COMMUNITY): Payer: Medicare Other

## 2010-09-17 DIAGNOSIS — J449 Chronic obstructive pulmonary disease, unspecified: Secondary | ICD-10-CM

## 2010-09-17 DIAGNOSIS — I050 Rheumatic mitral stenosis: Secondary | ICD-10-CM

## 2010-09-17 DIAGNOSIS — Z01810 Encounter for preprocedural cardiovascular examination: Secondary | ICD-10-CM

## 2010-09-17 DIAGNOSIS — J4489 Other specified chronic obstructive pulmonary disease: Secondary | ICD-10-CM

## 2010-09-17 DIAGNOSIS — I059 Rheumatic mitral valve disease, unspecified: Secondary | ICD-10-CM

## 2010-09-17 DIAGNOSIS — I2510 Atherosclerotic heart disease of native coronary artery without angina pectoris: Secondary | ICD-10-CM

## 2010-09-17 DIAGNOSIS — J9600 Acute respiratory failure, unspecified whether with hypoxia or hypercapnia: Secondary | ICD-10-CM

## 2010-09-17 LAB — BASIC METABOLIC PANEL
BUN: 28 mg/dL — ABNORMAL HIGH (ref 6–23)
CO2: 28 mEq/L (ref 19–32)
Calcium: 8.6 mg/dL (ref 8.4–10.5)
Chloride: 97 mEq/L (ref 96–112)
Creatinine, Ser: 0.94 mg/dL (ref 0.50–1.10)
GFR calc Af Amer: >60 mL/min (ref >60)
GFR calc non Af Amer: >60 mL/min (ref >60)
Glucose, Bld: 120 mg/dL — ABNORMAL HIGH (ref 70–99)
Potassium: 3.8 mEq/L (ref 3.5–5.1)
Sodium: 134 mEq/L — ABNORMAL LOW (ref 135–145)

## 2010-09-17 LAB — CBC
HCT: 22.5 % — ABNORMAL LOW (ref 36.0–46.0)
Hemoglobin: 7.1 g/dL — ABNORMAL LOW (ref 12.0–15.0)
MCH: 26.8 pg (ref 26.0–34.0)
MCHC: 31.6 g/dL (ref 30.0–36.0)
MCV: 84.9 fL (ref 78.0–100.0)
Platelets: 195 10*3/uL (ref 150–400)
RBC: 2.65 MIL/uL — ABNORMAL LOW (ref 3.87–5.11)
RDW: 17.8 % — ABNORMAL HIGH (ref 11.5–15.5)
WBC: 8.6 10*3/uL (ref 4.0–10.5)

## 2010-09-17 LAB — POCT I-STAT 3, ART BLOOD GAS (G3+)
Acid-Base Excess: 2 mmol/L (ref 0.0–2.0)
Bicarbonate: 28.5 mEq/L — ABNORMAL HIGH (ref 20.0–24.0)
O2 Saturation: 100 %
Patient temperature: 98.2
TCO2: 30 mmol/L (ref 0–100)
pCO2 arterial: 51.8 mmHg — ABNORMAL HIGH (ref 35.0–45.0)
pH, Arterial: 7.347 — ABNORMAL LOW (ref 7.350–7.400)
pO2, Arterial: 186 mmHg — ABNORMAL HIGH (ref 80.0–100.0)

## 2010-09-17 LAB — BLOOD GAS, ARTERIAL
Acid-Base Excess: 2.6 mmol/L — ABNORMAL HIGH (ref 0.0–2.0)
Bicarbonate: 26.8 mEq/L — ABNORMAL HIGH (ref 20.0–24.0)
Drawn by: 31996
FIO2: 0.6 %
Mode: POSITIVE
O2 Saturation: 100 %
PEEP: 6 cmH2O
Patient temperature: 98.6
Pressure support: 14 cmH2O
TCO2: 28.1 mmol/L (ref 0–100)
pCO2 arterial: 42.3 mmHg (ref 35.0–45.0)
pH, Arterial: 7.418 — ABNORMAL HIGH (ref 7.350–7.400)
pO2, Arterial: 236 mmHg — ABNORMAL HIGH (ref 80.0–100.0)

## 2010-09-17 LAB — GLUCOSE, CAPILLARY
Glucose-Capillary: 121 mg/dL — ABNORMAL HIGH (ref 70–99)
Glucose-Capillary: 108 mg/dL — ABNORMAL HIGH (ref 70–99)
Glucose-Capillary: 182 mg/dL — ABNORMAL HIGH (ref 70–99)
Glucose-Capillary: 217 mg/dL — ABNORMAL HIGH (ref 70–99)
Glucose-Capillary: 249 mg/dL — ABNORMAL HIGH (ref 70–99)
Glucose-Capillary: 247 mg/dL — ABNORMAL HIGH (ref 70–99)

## 2010-09-17 LAB — POTASSIUM: Potassium: 3.7 mEq/L (ref 3.5–5.1)

## 2010-09-18 ENCOUNTER — Inpatient Hospital Stay (HOSPITAL_COMMUNITY): Payer: Medicare Other

## 2010-09-18 DIAGNOSIS — I059 Rheumatic mitral valve disease, unspecified: Secondary | ICD-10-CM

## 2010-09-18 DIAGNOSIS — G971 Other reaction to spinal and lumbar puncture: Secondary | ICD-10-CM

## 2010-09-18 DIAGNOSIS — I4891 Unspecified atrial fibrillation: Secondary | ICD-10-CM

## 2010-09-18 DIAGNOSIS — J449 Chronic obstructive pulmonary disease, unspecified: Secondary | ICD-10-CM

## 2010-09-18 DIAGNOSIS — J4489 Other specified chronic obstructive pulmonary disease: Secondary | ICD-10-CM

## 2010-09-18 DIAGNOSIS — J9600 Acute respiratory failure, unspecified whether with hypoxia or hypercapnia: Secondary | ICD-10-CM

## 2010-09-18 LAB — COMPREHENSIVE METABOLIC PANEL
ALT: 18 U/L (ref 0–35)
AST: 18 U/L (ref 0–37)
Albumin: 3.1 g/dL — ABNORMAL LOW (ref 3.5–5.2)
Alkaline Phosphatase: 57 U/L (ref 39–117)
BUN: 28 mg/dL — ABNORMAL HIGH (ref 6–23)
CO2: 25 mEq/L (ref 19–32)
Calcium: 8.7 mg/dL (ref 8.4–10.5)
Chloride: 94 mEq/L — ABNORMAL LOW (ref 96–112)
Creatinine, Ser: 0.81 mg/dL (ref 0.50–1.10)
GFR calc Af Amer: >60 mL/min (ref >60)
GFR calc non Af Amer: >60 mL/min (ref >60)
Glucose, Bld: 295 mg/dL — ABNORMAL HIGH (ref 70–99)
Potassium: 3.8 mEq/L (ref 3.5–5.1)
Sodium: 134 mEq/L — ABNORMAL LOW (ref 135–145)
Total Bilirubin: 0.6 mg/dL (ref 0.3–1.2)
Total Protein: 6.8 g/dL (ref 6.0–8.3)

## 2010-09-18 LAB — HEMOGLOBIN A1C
Hgb A1c MFr Bld: 7.2 % — ABNORMAL HIGH (ref <5.7)
Mean Plasma Glucose: 160 mg/dL — ABNORMAL HIGH (ref <117)

## 2010-09-18 LAB — CEA: CEA: 5.9 ng/mL — ABNORMAL HIGH (ref 0.0–5.0)

## 2010-09-18 LAB — APTT: aPTT: 38 seconds — ABNORMAL HIGH (ref 24–37)

## 2010-09-18 LAB — GLUCOSE, CAPILLARY
Glucose-Capillary: 309 mg/dL — ABNORMAL HIGH (ref 70–99)
Glucose-Capillary: 284 mg/dL — ABNORMAL HIGH (ref 70–99)
Glucose-Capillary: 250 mg/dL — ABNORMAL HIGH (ref 70–99)
Glucose-Capillary: 389 mg/dL — ABNORMAL HIGH (ref 70–99)

## 2010-09-18 LAB — PREPARE RBC (CROSSMATCH)

## 2010-09-18 LAB — CBC
HCT: 30.4 % — ABNORMAL LOW (ref 36.0–46.0)
Hemoglobin: 9.9 g/dL — ABNORMAL LOW (ref 12.0–15.0)
MCH: 27.3 pg (ref 26.0–34.0)
MCHC: 32.6 g/dL (ref 30.0–36.0)
MCV: 84 fL (ref 78.0–100.0)
Platelets: 189 10*3/uL (ref 150–400)
RBC: 3.62 MIL/uL — ABNORMAL LOW (ref 3.87–5.11)
RDW: 17 % — ABNORMAL HIGH (ref 11.5–15.5)
WBC: 8.3 10*3/uL (ref 4.0–10.5)

## 2010-09-18 LAB — PREALBUMIN: Prealbumin: 9 mg/dL — ABNORMAL LOW (ref 17.0–34.0)

## 2010-09-18 MED ORDER — IOHEXOL 300 MG/ML  SOLN
100.0000 mL | Freq: Once | INTRAMUSCULAR | Status: AC | PRN
  Administered 2010-09-18: 100 mL via INTRAVENOUS

## 2010-09-19 ENCOUNTER — Inpatient Hospital Stay (HOSPITAL_COMMUNITY): Payer: Medicare Other

## 2010-09-19 DIAGNOSIS — I059 Rheumatic mitral valve disease, unspecified: Secondary | ICD-10-CM

## 2010-09-19 DIAGNOSIS — J4489 Other specified chronic obstructive pulmonary disease: Secondary | ICD-10-CM

## 2010-09-19 DIAGNOSIS — I050 Rheumatic mitral stenosis: Secondary | ICD-10-CM

## 2010-09-19 DIAGNOSIS — J449 Chronic obstructive pulmonary disease, unspecified: Secondary | ICD-10-CM

## 2010-09-19 DIAGNOSIS — J9600 Acute respiratory failure, unspecified whether with hypoxia or hypercapnia: Secondary | ICD-10-CM

## 2010-09-19 LAB — URINE MICROSCOPIC-ADD ON

## 2010-09-19 LAB — URINALYSIS, ROUTINE W REFLEX MICROSCOPIC
Bilirubin Urine: NEGATIVE
Glucose, UA: 500 mg/dL — AB
Ketones, ur: NEGATIVE mg/dL
Nitrite: NEGATIVE
Protein, ur: NEGATIVE mg/dL
Specific Gravity, Urine: 1.02 (ref 1.005–1.030)
Urobilinogen, UA: 4 mg/dL — ABNORMAL HIGH (ref 0.0–1.0)
pH: 6 (ref 5.0–8.0)

## 2010-09-19 LAB — GLUCOSE, CAPILLARY
Glucose-Capillary: 185 mg/dL — ABNORMAL HIGH (ref 70–99)
Glucose-Capillary: 256 mg/dL — ABNORMAL HIGH (ref 70–99)
Glucose-Capillary: 274 mg/dL — ABNORMAL HIGH (ref 70–99)
Glucose-Capillary: 278 mg/dL — ABNORMAL HIGH (ref 70–99)
Glucose-Capillary: 329 mg/dL — ABNORMAL HIGH (ref 70–99)
Glucose-Capillary: 335 mg/dL — ABNORMAL HIGH (ref 70–99)
Glucose-Capillary: 341 mg/dL — ABNORMAL HIGH (ref 70–99)
Glucose-Capillary: 361 mg/dL — ABNORMAL HIGH (ref 70–99)

## 2010-09-19 LAB — CBC
HCT: 28.4 % — ABNORMAL LOW (ref 36.0–46.0)
Hemoglobin: 9.4 g/dL — ABNORMAL LOW (ref 12.0–15.0)
MCH: 28.1 pg (ref 26.0–34.0)
MCHC: 33.1 g/dL (ref 30.0–36.0)
MCV: 85 fL (ref 78.0–100.0)
Platelets: 191 10*3/uL (ref 150–400)
RBC: 3.34 MIL/uL — ABNORMAL LOW (ref 3.87–5.11)
RDW: 17.1 % — ABNORMAL HIGH (ref 11.5–15.5)
WBC: 8.5 10*3/uL (ref 4.0–10.5)

## 2010-09-19 LAB — BASIC METABOLIC PANEL
BUN: 39 mg/dL — ABNORMAL HIGH (ref 6–23)
CO2: 31 mEq/L (ref 19–32)
Calcium: 8.6 mg/dL (ref 8.4–10.5)
Chloride: 91 mEq/L — ABNORMAL LOW (ref 96–112)
Creatinine, Ser: 1.1 mg/dL (ref 0.50–1.10)
GFR calc Af Amer: >60 mL/min (ref >60)
GFR calc non Af Amer: 50 mL/min — ABNORMAL LOW (ref >60)
Glucose, Bld: 340 mg/dL — ABNORMAL HIGH (ref 70–99)
Potassium: 3.7 mEq/L (ref 3.5–5.1)
Sodium: 130 mEq/L — ABNORMAL LOW (ref 135–145)

## 2010-09-19 LAB — MAGNESIUM: Magnesium: 2.6 mg/dL — ABNORMAL HIGH (ref 1.5–2.5)

## 2010-09-19 LAB — PREPARE RBC (CROSSMATCH)

## 2010-09-19 LAB — PHOSPHORUS: Phosphorus: 3.9 mg/dL (ref 2.3–4.6)

## 2010-09-20 ENCOUNTER — Other Ambulatory Visit: Payer: Self-pay | Admitting: Cardiothoracic Surgery

## 2010-09-20 ENCOUNTER — Inpatient Hospital Stay (HOSPITAL_COMMUNITY): Payer: Medicare Other

## 2010-09-20 DIAGNOSIS — I059 Rheumatic mitral valve disease, unspecified: Secondary | ICD-10-CM

## 2010-09-20 DIAGNOSIS — I4891 Unspecified atrial fibrillation: Secondary | ICD-10-CM

## 2010-09-20 HISTORY — PX: MAZE: SHX5063

## 2010-09-20 HISTORY — PX: MITRAL VALVE REPLACEMENT: SHX147

## 2010-09-20 LAB — POCT I-STAT, CHEM 8
BUN: 27 mg/dL — ABNORMAL HIGH (ref 6–23)
Calcium, Ion: 1.04 mmol/L — ABNORMAL LOW (ref 1.12–1.32)
Chloride: 103 mEq/L (ref 96–112)
Creatinine, Ser: 1.1 mg/dL (ref 0.50–1.10)
Glucose, Bld: 125 mg/dL — ABNORMAL HIGH (ref 70–99)
HCT: 25 % — ABNORMAL LOW (ref 36.0–46.0)
Hemoglobin: 8.5 g/dL — ABNORMAL LOW (ref 12.0–15.0)
Potassium: 4.2 mEq/L (ref 3.5–5.1)
Sodium: 140 mEq/L (ref 135–145)
TCO2: 26 mmol/L (ref 0–100)

## 2010-09-20 LAB — POCT I-STAT 3, ART BLOOD GAS (G3+)
Acid-Base Excess: 4 mmol/L — ABNORMAL HIGH (ref 0.0–2.0)
Acid-Base Excess: 3 mmol/L — ABNORMAL HIGH (ref 0.0–2.0)
Acid-Base Excess: 3 mmol/L — ABNORMAL HIGH (ref 0.0–2.0)
Acid-Base Excess: 3 mmol/L — ABNORMAL HIGH (ref 0.0–2.0)
Acid-Base Excess: 4 mmol/L — ABNORMAL HIGH (ref 0.0–2.0)
Acid-Base Excess: 4 mmol/L — ABNORMAL HIGH (ref 0.0–2.0)
Acid-Base Excess: 5 mmol/L — ABNORMAL HIGH (ref 0.0–2.0)
Bicarbonate: 29.5 mEq/L — ABNORMAL HIGH (ref 20.0–24.0)
Bicarbonate: 28.6 mEq/L — ABNORMAL HIGH (ref 20.0–24.0)
Bicarbonate: 30.7 mEq/L — ABNORMAL HIGH (ref 20.0–24.0)
Bicarbonate: 30.9 mEq/L — ABNORMAL HIGH (ref 20.0–24.0)
Bicarbonate: 29.6 mEq/L — ABNORMAL HIGH (ref 20.0–24.0)
Bicarbonate: 28.6 mEq/L — ABNORMAL HIGH (ref 20.0–24.0)
Bicarbonate: 29.6 mEq/L — ABNORMAL HIGH (ref 20.0–24.0)
O2 Saturation: 100 %
O2 Saturation: 100 %
O2 Saturation: 95 %
O2 Saturation: 98 %
O2 Saturation: 99 %
O2 Saturation: 100 %
O2 Saturation: 100 %
Patient temperature: 35.6
Patient temperature: 36.2
Patient temperature: 37.2
Patient temperature: 37.4
Patient temperature: 99.5
TCO2: 31 mmol/L (ref 0–100)
TCO2: 30 mmol/L (ref 0–100)
TCO2: 33 mmol/L (ref 0–100)
TCO2: 33 mmol/L (ref 0–100)
TCO2: 31 mmol/L (ref 0–100)
TCO2: 30 mmol/L (ref 0–100)
TCO2: 31 mmol/L (ref 0–100)
pCO2 arterial: 45.6 mmHg — ABNORMAL HIGH (ref 35.0–45.0)
pCO2 arterial: 49.3 mmHg — ABNORMAL HIGH (ref 35.0–45.0)
pCO2 arterial: 62.4 mmHg (ref 35.0–45.0)
pCO2 arterial: 63.1 mmHg (ref 35.0–45.0)
pCO2 arterial: 48.1 mmHg — ABNORMAL HIGH (ref 35.0–45.0)
pCO2 arterial: 45 mmHg (ref 35.0–45.0)
pCO2 arterial: 45.8 mmHg — ABNORMAL HIGH (ref 35.0–45.0)
pH, Arterial: 7.419 — ABNORMAL HIGH (ref 7.350–7.400)
pH, Arterial: 7.372 (ref 7.350–7.400)
pH, Arterial: 7.294 — ABNORMAL LOW (ref 7.350–7.400)
pH, Arterial: 7.294 — ABNORMAL LOW (ref 7.350–7.400)
pH, Arterial: 7.398 (ref 7.350–7.400)
pH, Arterial: 7.413 — ABNORMAL HIGH (ref 7.350–7.400)
pH, Arterial: 7.42 — ABNORMAL HIGH (ref 7.350–7.400)
pO2, Arterial: 223 mmHg — ABNORMAL HIGH (ref 80.0–100.0)
pO2, Arterial: 231 mmHg — ABNORMAL HIGH (ref 80.0–100.0)
pO2, Arterial: 81 mmHg (ref 80.0–100.0)
pO2, Arterial: 118 mmHg — ABNORMAL HIGH (ref 80.0–100.0)
pO2, Arterial: 167 mmHg — ABNORMAL HIGH (ref 80.0–100.0)
pO2, Arterial: 180 mmHg — ABNORMAL HIGH (ref 80.0–100.0)
pO2, Arterial: 198 mmHg — ABNORMAL HIGH (ref 80.0–100.0)

## 2010-09-20 LAB — POCT I-STAT 4, (NA,K, GLUC, HGB,HCT)
Glucose, Bld: 94 mg/dL (ref 70–99)
Glucose, Bld: 88 mg/dL (ref 70–99)
Glucose, Bld: 90 mg/dL (ref 70–99)
Glucose, Bld: 104 mg/dL — ABNORMAL HIGH (ref 70–99)
Glucose, Bld: 89 mg/dL (ref 70–99)
Glucose, Bld: 187 mg/dL — ABNORMAL HIGH (ref 70–99)
Glucose, Bld: 207 mg/dL — ABNORMAL HIGH (ref 70–99)
HCT: 27 % — ABNORMAL LOW (ref 36.0–46.0)
HCT: 23 % — ABNORMAL LOW (ref 36.0–46.0)
HCT: 29 % — ABNORMAL LOW (ref 36.0–46.0)
HCT: 23 % — ABNORMAL LOW (ref 36.0–46.0)
HCT: 23 % — ABNORMAL LOW (ref 36.0–46.0)
HCT: 27 % — ABNORMAL LOW (ref 36.0–46.0)
HCT: 34 % — ABNORMAL LOW (ref 36.0–46.0)
Hemoglobin: 9.2 g/dL — ABNORMAL LOW (ref 12.0–15.0)
Hemoglobin: 7.8 g/dL — ABNORMAL LOW (ref 12.0–15.0)
Hemoglobin: 9.9 g/dL — ABNORMAL LOW (ref 12.0–15.0)
Hemoglobin: 7.8 g/dL — ABNORMAL LOW (ref 12.0–15.0)
Hemoglobin: 7.8 g/dL — ABNORMAL LOW (ref 12.0–15.0)
Hemoglobin: 9.2 g/dL — ABNORMAL LOW (ref 12.0–15.0)
Hemoglobin: 11.6 g/dL — ABNORMAL LOW (ref 12.0–15.0)
Potassium: 3.6 mEq/L (ref 3.5–5.1)
Potassium: 3.6 mEq/L (ref 3.5–5.1)
Potassium: 3.9 mEq/L (ref 3.5–5.1)
Potassium: 3.6 mEq/L (ref 3.5–5.1)
Potassium: 5.4 mEq/L — ABNORMAL HIGH (ref 3.5–5.1)
Potassium: 3.8 mEq/L (ref 3.5–5.1)
Potassium: 3.4 mEq/L — ABNORMAL LOW (ref 3.5–5.1)
Sodium: 137 mEq/L (ref 135–145)
Sodium: 138 mEq/L (ref 135–145)
Sodium: 147 mEq/L — ABNORMAL HIGH (ref 135–145)
Sodium: 135 mEq/L (ref 135–145)
Sodium: 133 mEq/L — ABNORMAL LOW (ref 135–145)
Sodium: 136 mEq/L (ref 135–145)
Sodium: 138 mEq/L (ref 135–145)

## 2010-09-20 LAB — CBC
HCT: 31.1 % — ABNORMAL LOW (ref 36.0–46.0)
HCT: 27 % — ABNORMAL LOW (ref 36.0–46.0)
HCT: 26.5 % — ABNORMAL LOW (ref 36.0–46.0)
Hemoglobin: 9.8 g/dL — ABNORMAL LOW (ref 12.0–15.0)
Hemoglobin: 9 g/dL — ABNORMAL LOW (ref 12.0–15.0)
Hemoglobin: 8.8 g/dL — ABNORMAL LOW (ref 12.0–15.0)
MCH: 27.3 pg (ref 26.0–34.0)
MCH: 28.7 pg (ref 26.0–34.0)
MCH: 28.6 pg (ref 26.0–34.0)
MCHC: 31.5 g/dL (ref 30.0–36.0)
MCHC: 33.3 g/dL (ref 30.0–36.0)
MCHC: 33.2 g/dL (ref 30.0–36.0)
MCV: 86.6 fL (ref 78.0–100.0)
MCV: 86 fL (ref 78.0–100.0)
MCV: 86 fL (ref 78.0–100.0)
Platelets: 198 10*3/uL (ref 150–400)
Platelets: 212 10*3/uL (ref 150–400)
Platelets: 238 10*3/uL (ref 150–400)
RBC: 3.59 MIL/uL — ABNORMAL LOW (ref 3.87–5.11)
RBC: 3.14 MIL/uL — ABNORMAL LOW (ref 3.87–5.11)
RBC: 3.08 MIL/uL — ABNORMAL LOW (ref 3.87–5.11)
RDW: 16.8 % — ABNORMAL HIGH (ref 11.5–15.5)
RDW: 16.4 % — ABNORMAL HIGH (ref 11.5–15.5)
RDW: 16.5 % — ABNORMAL HIGH (ref 11.5–15.5)
WBC: 9.6 10*3/uL (ref 4.0–10.5)
WBC: 15.4 10*3/uL — ABNORMAL HIGH (ref 4.0–10.5)
WBC: 17.9 10*3/uL — ABNORMAL HIGH (ref 4.0–10.5)

## 2010-09-20 LAB — MAGNESIUM: Magnesium: 3.5 mg/dL — ABNORMAL HIGH (ref 1.5–2.5)

## 2010-09-20 LAB — BASIC METABOLIC PANEL
BUN: 35 mg/dL — ABNORMAL HIGH (ref 6–23)
CO2: 33 mEq/L — ABNORMAL HIGH (ref 19–32)
Calcium: 9.2 mg/dL (ref 8.4–10.5)
Chloride: 97 mEq/L (ref 96–112)
Creatinine, Ser: 1.03 mg/dL (ref 0.50–1.10)
GFR calc Af Amer: >60 mL/min (ref >60)
GFR calc non Af Amer: 54 mL/min — ABNORMAL LOW (ref >60)
Glucose, Bld: 146 mg/dL — ABNORMAL HIGH (ref 70–99)
Potassium: 4 mEq/L (ref 3.5–5.1)
Sodium: 136 mEq/L (ref 135–145)

## 2010-09-20 LAB — POCT I-STAT GLUCOSE
Glucose, Bld: 90 mg/dL (ref 70–99)
Glucose, Bld: 87 mg/dL (ref 70–99)
Operator id: 3406
Operator id: 3406

## 2010-09-20 LAB — PROTIME-INR
INR: 1.16 (ref 0.00–1.49)
INR: 1.37 (ref 0.00–1.49)
Prothrombin Time: 15 seconds (ref 11.6–15.2)
Prothrombin Time: 17.1 seconds — ABNORMAL HIGH (ref 11.6–15.2)

## 2010-09-20 LAB — HEMOGLOBIN AND HEMATOCRIT, BLOOD
HCT: 22.9 % — ABNORMAL LOW (ref 36.0–46.0)
Hemoglobin: 7.9 g/dL — ABNORMAL LOW (ref 12.0–15.0)

## 2010-09-20 LAB — APTT: aPTT: 31 seconds (ref 24–37)

## 2010-09-20 LAB — CARBOXYHEMOGLOBIN
Carboxyhemoglobin: 2.1 % — ABNORMAL HIGH (ref 0.5–1.5)
Methemoglobin: 1.3 % (ref 0.0–1.5)
O2 Saturation: 98.7 %
Total hemoglobin: 9.6 g/dL — ABNORMAL LOW (ref 12.5–16.0)

## 2010-09-20 LAB — CREATININE, SERUM
Creatinine, Ser: 0.86 mg/dL (ref 0.50–1.10)
GFR calc Af Amer: >60 mL/min (ref >60)
GFR calc non Af Amer: >60 mL/min (ref >60)

## 2010-09-20 LAB — PLATELET COUNT: Platelets: 111 10*3/uL — ABNORMAL LOW (ref 150–400)

## 2010-09-20 LAB — GLUCOSE, CAPILLARY
Glucose-Capillary: 179 mg/dL — ABNORMAL HIGH (ref 70–99)
Glucose-Capillary: 192 mg/dL — ABNORMAL HIGH (ref 70–99)
Glucose-Capillary: 198 mg/dL — ABNORMAL HIGH (ref 70–99)
Glucose-Capillary: 153 mg/dL — ABNORMAL HIGH (ref 70–99)

## 2010-09-21 ENCOUNTER — Inpatient Hospital Stay (HOSPITAL_COMMUNITY): Payer: Medicare Other

## 2010-09-21 DIAGNOSIS — E119 Type 2 diabetes mellitus without complications: Secondary | ICD-10-CM

## 2010-09-21 LAB — POCT I-STAT 3, ART BLOOD GAS (G3+)
Acid-Base Excess: 6 mmol/L — ABNORMAL HIGH (ref 0.0–2.0)
Acid-Base Excess: 4 mmol/L — ABNORMAL HIGH (ref 0.0–2.0)
Acid-Base Excess: 4 mmol/L — ABNORMAL HIGH (ref 0.0–2.0)
Bicarbonate: 29.3 mEq/L — ABNORMAL HIGH (ref 20.0–24.0)
Bicarbonate: 28 mEq/L — ABNORMAL HIGH (ref 20.0–24.0)
Bicarbonate: 27.7 mEq/L — ABNORMAL HIGH (ref 20.0–24.0)
O2 Saturation: 100 %
O2 Saturation: 97 %
O2 Saturation: 94 %
Patient temperature: 99.5
TCO2: 30 mmol/L (ref 0–100)
TCO2: 29 mmol/L (ref 0–100)
TCO2: 29 mmol/L (ref 0–100)
pCO2 arterial: 39 mmHg (ref 35.0–45.0)
pCO2 arterial: 39.2 mmHg (ref 35.0–45.0)
pCO2 arterial: 38.4 mmHg (ref 35.0–45.0)
pH, Arterial: 7.485 — ABNORMAL HIGH (ref 7.350–7.400)
pH, Arterial: 7.462 — ABNORMAL HIGH (ref 7.350–7.400)
pH, Arterial: 7.467 — ABNORMAL HIGH (ref 7.350–7.400)
pO2, Arterial: 188 mmHg — ABNORMAL HIGH (ref 80.0–100.0)
pO2, Arterial: 82 mmHg (ref 80.0–100.0)
pO2, Arterial: 66 mmHg — ABNORMAL LOW (ref 80.0–100.0)

## 2010-09-21 LAB — POCT I-STAT, CHEM 8
BUN: 24 mg/dL — ABNORMAL HIGH (ref 6–23)
Calcium, Ion: 1.05 mmol/L — ABNORMAL LOW (ref 1.12–1.32)
Chloride: 102 mEq/L (ref 96–112)
Creatinine, Ser: 1.2 mg/dL — ABNORMAL HIGH (ref 0.50–1.10)
Glucose, Bld: 88 mg/dL (ref 70–99)
HCT: 28 % — ABNORMAL LOW (ref 36.0–46.0)
Hemoglobin: 9.5 g/dL — ABNORMAL LOW (ref 12.0–15.0)
Potassium: 3.6 mEq/L (ref 3.5–5.1)
Sodium: 140 mEq/L (ref 135–145)
TCO2: 26 mmol/L (ref 0–100)

## 2010-09-21 LAB — CROSSMATCH
ABO/RH(D): A NEG
Antibody Screen: NEGATIVE
Unit division: 0
Unit division: 0
Unit division: 0
Unit division: 0
Unit division: 0
Unit division: 0
Unit division: 0
Unit division: 0

## 2010-09-21 LAB — PREPARE FRESH FROZEN PLASMA
Unit division: 0
Unit division: 0

## 2010-09-21 LAB — CBC
HCT: 23.6 % — ABNORMAL LOW (ref 36.0–46.0)
HCT: 27 % — ABNORMAL LOW (ref 36.0–46.0)
Hemoglobin: 7.8 g/dL — ABNORMAL LOW (ref 12.0–15.0)
Hemoglobin: 9 g/dL — ABNORMAL LOW (ref 12.0–15.0)
MCH: 28.6 pg (ref 26.0–34.0)
MCH: 28.2 pg (ref 26.0–34.0)
MCHC: 33.1 g/dL (ref 30.0–36.0)
MCHC: 33.3 g/dL (ref 30.0–36.0)
MCV: 86.4 fL (ref 78.0–100.0)
MCV: 84.6 fL (ref 78.0–100.0)
Platelets: 197 10*3/uL (ref 150–400)
Platelets: 134 10*3/uL — ABNORMAL LOW (ref 150–400)
RBC: 2.73 MIL/uL — ABNORMAL LOW (ref 3.87–5.11)
RBC: 3.19 MIL/uL — ABNORMAL LOW (ref 3.87–5.11)
RDW: 17 % — ABNORMAL HIGH (ref 11.5–15.5)
RDW: 16.8 % — ABNORMAL HIGH (ref 11.5–15.5)
WBC: 14.3 10*3/uL — ABNORMAL HIGH (ref 4.0–10.5)
WBC: 13 10*3/uL — ABNORMAL HIGH (ref 4.0–10.5)

## 2010-09-21 LAB — GLUCOSE, CAPILLARY
Glucose-Capillary: 100 mg/dL — ABNORMAL HIGH (ref 70–99)
Glucose-Capillary: 102 mg/dL — ABNORMAL HIGH (ref 70–99)
Glucose-Capillary: 102 mg/dL — ABNORMAL HIGH (ref 70–99)
Glucose-Capillary: 103 mg/dL — ABNORMAL HIGH (ref 70–99)
Glucose-Capillary: 103 mg/dL — ABNORMAL HIGH (ref 70–99)
Glucose-Capillary: 106 mg/dL — ABNORMAL HIGH (ref 70–99)
Glucose-Capillary: 107 mg/dL — ABNORMAL HIGH (ref 70–99)
Glucose-Capillary: 107 mg/dL — ABNORMAL HIGH (ref 70–99)
Glucose-Capillary: 109 mg/dL — ABNORMAL HIGH (ref 70–99)
Glucose-Capillary: 111 mg/dL — ABNORMAL HIGH (ref 70–99)
Glucose-Capillary: 114 mg/dL — ABNORMAL HIGH (ref 70–99)
Glucose-Capillary: 130 mg/dL — ABNORMAL HIGH (ref 70–99)
Glucose-Capillary: 141 mg/dL — ABNORMAL HIGH (ref 70–99)
Glucose-Capillary: 81 mg/dL (ref 70–99)
Glucose-Capillary: 82 mg/dL (ref 70–99)
Glucose-Capillary: 86 mg/dL (ref 70–99)
Glucose-Capillary: 91 mg/dL (ref 70–99)
Glucose-Capillary: 94 mg/dL (ref 70–99)
Glucose-Capillary: 99 mg/dL (ref 70–99)
Glucose-Capillary: 99 mg/dL (ref 70–99)

## 2010-09-21 LAB — BASIC METABOLIC PANEL
BUN: 31 mg/dL — ABNORMAL HIGH (ref 6–23)
CO2: 29 mEq/L (ref 19–32)
Calcium: 7.6 mg/dL — ABNORMAL LOW (ref 8.4–10.5)
Chloride: 104 mEq/L (ref 96–112)
Creatinine, Ser: 1.11 mg/dL — ABNORMAL HIGH (ref 0.50–1.10)
GFR calc Af Amer: >60 mL/min (ref >60)
GFR calc non Af Amer: 50 mL/min — ABNORMAL LOW (ref >60)
Glucose, Bld: 89 mg/dL (ref 70–99)
Potassium: 4.6 mEq/L (ref 3.5–5.1)
Sodium: 137 mEq/L (ref 135–145)

## 2010-09-21 LAB — PREPARE PLATELET PHERESIS
Unit division: 0
Unit division: 0

## 2010-09-21 LAB — MAGNESIUM
Magnesium: 3.3 mg/dL — ABNORMAL HIGH (ref 1.5–2.5)
Magnesium: 3 mg/dL — ABNORMAL HIGH (ref 1.5–2.5)

## 2010-09-21 LAB — CREATININE, SERUM
Creatinine, Ser: 0.89 mg/dL (ref 0.50–1.10)
GFR calc Af Amer: >60 mL/min (ref >60)
GFR calc non Af Amer: >60 mL/min (ref >60)

## 2010-09-21 LAB — METHEMOGLOBIN: Methemoglobin: 1.1 % (ref 0.0–1.5)

## 2010-09-22 ENCOUNTER — Inpatient Hospital Stay (HOSPITAL_COMMUNITY): Payer: Medicare Other

## 2010-09-22 LAB — BASIC METABOLIC PANEL
BUN: 23 mg/dL (ref 6–23)
CO2: 30 mEq/L (ref 19–32)
Calcium: 8.1 mg/dL — ABNORMAL LOW (ref 8.4–10.5)
Chloride: 103 mEq/L (ref 96–112)
Creatinine, Ser: 0.82 mg/dL (ref 0.50–1.10)
GFR calc Af Amer: >60 mL/min (ref >60)
GFR calc non Af Amer: >60 mL/min (ref >60)
Glucose, Bld: 93 mg/dL (ref 70–99)
Potassium: 3.6 mEq/L (ref 3.5–5.1)
Sodium: 140 mEq/L (ref 135–145)

## 2010-09-22 LAB — GLUCOSE, CAPILLARY
Glucose-Capillary: 100 mg/dL — ABNORMAL HIGH (ref 70–99)
Glucose-Capillary: 122 mg/dL — ABNORMAL HIGH (ref 70–99)
Glucose-Capillary: 174 mg/dL — ABNORMAL HIGH (ref 70–99)
Glucose-Capillary: 131 mg/dL — ABNORMAL HIGH (ref 70–99)
Glucose-Capillary: 123 mg/dL — ABNORMAL HIGH (ref 70–99)
Glucose-Capillary: 131 mg/dL — ABNORMAL HIGH (ref 70–99)

## 2010-09-22 LAB — CBC
HCT: 25 % — ABNORMAL LOW (ref 36.0–46.0)
Hemoglobin: 8.4 g/dL — ABNORMAL LOW (ref 12.0–15.0)
MCH: 28.6 pg (ref 26.0–34.0)
MCHC: 33.6 g/dL (ref 30.0–36.0)
MCV: 85 fL (ref 78.0–100.0)
Platelets: 135 10*3/uL — ABNORMAL LOW (ref 150–400)
RBC: 2.94 MIL/uL — ABNORMAL LOW (ref 3.87–5.11)
RDW: 17.2 % — ABNORMAL HIGH (ref 11.5–15.5)
WBC: 15.4 10*3/uL — ABNORMAL HIGH (ref 4.0–10.5)

## 2010-09-22 LAB — POTASSIUM: Potassium: 4.7 mEq/L (ref 3.5–5.1)

## 2010-09-23 ENCOUNTER — Inpatient Hospital Stay (HOSPITAL_COMMUNITY): Payer: Medicare Other

## 2010-09-23 LAB — CBC
HCT: 22.9 % — ABNORMAL LOW (ref 36.0–46.0)
Hemoglobin: 7.6 g/dL — ABNORMAL LOW (ref 12.0–15.0)
MCH: 28.1 pg (ref 26.0–34.0)
MCHC: 33.2 g/dL (ref 30.0–36.0)
MCV: 84.8 fL (ref 78.0–100.0)
Platelets: 176 10*3/uL (ref 150–400)
RBC: 2.7 MIL/uL — ABNORMAL LOW (ref 3.87–5.11)
RDW: 16.9 % — ABNORMAL HIGH (ref 11.5–15.5)
WBC: 15.8 10*3/uL — ABNORMAL HIGH (ref 4.0–10.5)

## 2010-09-23 LAB — POCT I-STAT, CHEM 8
BUN: 32 mg/dL — ABNORMAL HIGH (ref 6–23)
Calcium, Ion: 1.07 mmol/L — ABNORMAL LOW (ref 1.12–1.32)
Chloride: 99 mEq/L (ref 96–112)
Creatinine, Ser: 1.1 mg/dL (ref 0.50–1.10)
Glucose, Bld: 241 mg/dL — ABNORMAL HIGH (ref 70–99)
HCT: 28 % — ABNORMAL LOW (ref 36.0–46.0)
Hemoglobin: 9.5 g/dL — ABNORMAL LOW (ref 12.0–15.0)
Potassium: 4 mEq/L (ref 3.5–5.1)
Sodium: 135 mEq/L (ref 135–145)
TCO2: 25 mmol/L (ref 0–100)

## 2010-09-23 LAB — POCT I-STAT 3, ART BLOOD GAS (G3+)
Acid-Base Excess: 5 mmol/L — ABNORMAL HIGH (ref 0.0–2.0)
Acid-Base Excess: 3 mmol/L — ABNORMAL HIGH (ref 0.0–2.0)
Acid-Base Excess: 2 mmol/L (ref 0.0–2.0)
Bicarbonate: 29.3 mEq/L — ABNORMAL HIGH (ref 20.0–24.0)
Bicarbonate: 26.9 mEq/L — ABNORMAL HIGH (ref 20.0–24.0)
Bicarbonate: 26.3 mEq/L — ABNORMAL HIGH (ref 20.0–24.0)
O2 Saturation: 99 %
O2 Saturation: 98 %
O2 Saturation: 99 %
Patient temperature: 37.6
Patient temperature: 98.3
Patient temperature: 97.7
TCO2: 31 mmol/L (ref 0–100)
TCO2: 28 mmol/L (ref 0–100)
TCO2: 27 mmol/L (ref 0–100)
pCO2 arterial: 40.8 mmHg (ref 35.0–45.0)
pCO2 arterial: 36.2 mmHg (ref 35.0–45.0)
pCO2 arterial: 35.8 mmHg (ref 35.0–45.0)
pH, Arterial: 7.467 — ABNORMAL HIGH (ref 7.350–7.400)
pH, Arterial: 7.478 — ABNORMAL HIGH (ref 7.350–7.400)
pH, Arterial: 7.472 — ABNORMAL HIGH (ref 7.350–7.400)
pO2, Arterial: 151 mmHg — ABNORMAL HIGH (ref 80.0–100.0)
pO2, Arterial: 102 mmHg — ABNORMAL HIGH (ref 80.0–100.0)
pO2, Arterial: 113 mmHg — ABNORMAL HIGH (ref 80.0–100.0)

## 2010-09-23 LAB — GLUCOSE, CAPILLARY
Glucose-Capillary: 113 mg/dL — ABNORMAL HIGH (ref 70–99)
Glucose-Capillary: 129 mg/dL — ABNORMAL HIGH (ref 70–99)
Glucose-Capillary: 152 mg/dL — ABNORMAL HIGH (ref 70–99)
Glucose-Capillary: 224 mg/dL — ABNORMAL HIGH (ref 70–99)
Glucose-Capillary: 129 mg/dL — ABNORMAL HIGH (ref 70–99)

## 2010-09-23 LAB — BASIC METABOLIC PANEL
BUN: 31 mg/dL — ABNORMAL HIGH (ref 6–23)
CO2: 28 mEq/L (ref 19–32)
Calcium: 8.1 mg/dL — ABNORMAL LOW (ref 8.4–10.5)
Chloride: 99 mEq/L (ref 96–112)
Creatinine, Ser: 0.93 mg/dL (ref 0.50–1.10)
GFR calc Af Amer: >60 mL/min (ref >60)
GFR calc non Af Amer: >60 mL/min (ref >60)
Glucose, Bld: 121 mg/dL — ABNORMAL HIGH (ref 70–99)
Potassium: 4 mEq/L (ref 3.5–5.1)
Sodium: 135 mEq/L (ref 135–145)

## 2010-09-23 LAB — PREPARE RBC (CROSSMATCH)

## 2010-09-23 NOTE — Consult Note (Signed)
NAMECHARLIEE, KRENZ NO.:  [id number]  MEDICAL RECORD NO.:  [id number]  LOCATION:  EKG                          FACILITY:  [facility]  PHYSICIAN:  Lanelle Bal, MD    DATE OF BIRTH:  25-Apr-1950  DATE OF CONSULTATION: DATE OF DISCHARGE:  09/11/2010                                CONSULTATION   REQUESTING PHYSICIAN:  Denice Bors. Stanford Breed, MD, [facility]  FOLLOWUP CARDIOLOGIST:  Denice Bors. Stanford Breed, MD, [facility]  PRIMARY CARE PHYSICIAN:  Dr. Doy Mince, Internal Medicine.  REASON FOR CONSULTATION:  Question candidate for mitral valve replacement.  HISTORY OF PRESENT ILLNESS:  The patient is a 60 year old female with known mitral stenosis for greater than 5 years who is now on August 1 was admitted for elective cardiac catheterization.  TE was also performed.  She notes that she has been so severely limited.  She notes that until she was hospitalized, without activity would be fairly comfortable but with much exertion to walk to her mailbox, she would have to stop frequently to rest.  She denies any orthopnea.  Denies palpitations.  She has had no history of myocardial infarction.  No previous angioplasties or previous cardiac surgery.  She did have a cath several years ago being evaluated for mitral stenosis.  At that time she was being followed by Dr. Domenic Polite.  CARDIAC RISK FACTORS:  She denies hypertension.  She has hyperlipidemia on medications.  She has had diabetes for the 10 years.  She thinks her last hemoglobin A1c was 9.  She is a long-term smoker up to 2 packs a day since age 34.  She denies previous stroke.  Denies claudication. Denies renal insufficiency.  PAST MEDICAL HISTORY:  Significant for COPD though she notes that she has never been evaluated by Pulmonary.  She has had chronic anemia, iron of 21, TIBC 441, ferritin 11.  She has been unable to tolerate iron supplementation.  She has had a colonoscopy in 2007.  She is scheduled to have another one in 3  months.  She had a small bowel endoscopy November 2011.  It is unclear what further anemia workup has been done but has had required several transfusions over the past year, history of fibromyalgia, history of obstructive sleep apnea on CPAP, history of bilateral carotid stenosis.  In May 2012, she underwent right carotid endarterectomy by [name].  PREVIOUS SURGICAL HISTORY:  ORIF of the right clavicle for nonunion, carotid endarterectomy for asymptomatic right carotid stenosis May 2012. Endometrial ablation in 2003.  SOCIAL HISTORY:  The patient is divorced, lives alone, worked in the [employer] but currently not working.  Denies alcohol use.  FAMILY HISTORY:  Significant for father who died of myocardial infarction with a ruptured peptic ulcer at age 33.  Mother died at age 43 of pneumonia.  One brother died at age 8, myocardial infarction. She has 2 children who she notes their health are good.  MEDICATIONS:  Albuterol, Xanax, aspirin, Wellbutrin, Colace, Cymbalta, Advair, Neurontin, NovoLog insulin, hydrocodone, Lantus, lisinopril/hydrochlorothiazide, Claritin, Prilosec, Crestor, and Spiriva.  ALLERGIES:  She is allergic to MORPHINE, ULTRAM, and PERCOCET.  REVIEW OF SYSTEMS:  CARDIAC:  Positive for chest pain, resting, shortness of  breath, exertional shortness of breath, presyncope.  She denies orthopnea but does sleep on 2 pillows. Denies syncope, palpitations, or lower extremity edema.  She notes that since admission her shortness of breath has gotten worse while in the hospital with little or no activity.  GENERAL:  She does have constitutional symptoms. Denies fever, chills, or night sweats but has had progressive shortness of breath.  Denies hemoptysis.  Denies change in bowel habits.  Does have neuropathy.  Denies any blood in her stool or urine that is obvious.  She has had history of depression and anxiety.  Other review of systems are negative.  PHYSICAL  EXAMINATION:  Blood pressure is 100/50, respiratory rate is 25- 26.  When I first entered the room, she was on her CPAP machine just with minimal activity of sitting up in the bed.  She is very dyspneic with audible wheezing.  She is 5 feet 2 inches tall, 180 pounds.  She has incision of her right carotid endarterectomy well-healed.  She has diffuse significant wheezing and rhonchi.  Cardiac exam is difficult to hear heart tones due to the extensive wheezing throughout her chest. Abdominal exam is without tenderness.  Lower extremities without palpable pulses, but with capillary refill.  She has no pedal edema.  The patient's transesophageal echo done July 5 shows a thickened mitral valve with a fixed posterior leaflet, mild-to-moderate mitral regurgitation.  Mitral valve area was calculated at 1.75 cm2.  Mean gradient was estimated at 16, peak at 30.  Valve area by pressure half time was 0.9 at cardiac catheterization.  Her PA pressures revealed severe pulmonary hypertension with arterial pressures equal to pulmonary artery pressures with LV of 99, PA of 94.  Systolic wedge pressure is 36, calculated valve area of 0.9 at the time of catheterization.  She developed atrial fibrillation during the catheterization which by history was new and since that time has had increasing dyspnea even at rest.  There was no evidence of coronary artery disease.  She has normal LV function.  Her creatinine is 0.83, glucose 157.  She continues to be anemic with hematocrit of 27, hemoglobin of 9, platelet count is 186,000.  IMPRESSION:  On exam now  the patient has impending respiratory collapse and I have discussed with the covering Cardiology Service about her needing a higher level of care and to work on her pulmonary status.  At this point, I would not consider candidate for mitral valve replacement in at least the next several days.  It maybe that stabilizing her pulmonary condition, having her off  cigarettes for a period of time, and then consider mitral valve replacement but TE, suggestion of valvuloplasty could be considered, however, there is a significant amount of calcium in the valve leaflets.  The posterior leaflet is fixed and she has at least mild if not moderate mitral regurgitation which would likely get worse with balloon dilatation.  We could send a copy of the echo to the Service that is actively involved with balloon valvuloplasty for opinion but likely they would decline.     Lanelle Bal, MD     EG/MEDQ  D:  09/15/2010  T:  09/16/2010  Job:  [id number]  Electronically Signed by Lanelle Bal MD on 09/23/2010 07:31:52 AM

## 2010-09-23 NOTE — Op Note (Signed)
NAMEBELVA, [name] NO.:  [id number]  MEDICAL RECORD NO.:  [id number]  LOCATION:  2316                         FACILITY:  [facility] [facility]  PHYSICIAN:  [name], M.D.  DATE OF BIRTH:  [birth year]  DATE OF PROCEDURE:  09/20/2010 DATE OF DISCHARGE:                              OPERATIVE REPORT   OPERATIONS: 1. Mitral valve replacement with a 27-mm pericardial porcine valve     (Medtronic Mosaic valve, serial [id number]). 2. Left atrial maze procedure for paroxysmal atrial fibrillation. 3. Placement of femoral arterial line for blood pressure monitoring.  SURGEON:  [name], MD  ASSISTANT:  Suzzanne Cloud, PA-C  PREOPERATIVE DIAGNOSES:  Severe mitral stenosis with pulmonary hypertension and class IV congestive heart failure.  POSTOPERATIVE DIAGNOSES:  Severe mitral stenosis with pulmonary hypertension and class IV congestive heart failure.  ANESTHESIA:  General by Dr. Rica Koyanagi.  INDICATIONS:  The patient is a 60 year old chronically ill female, who was admitted to the hospital with respiratory distress and pulmonary edema from CHF, who was found to have severe mitral stenosis documented by both echo and cath.  She had no significant coronary artery disease, mild-to-moderate TR, and no significant aortic valve disease.  She is felt to be a candidate for mitral valve replacement; however, she was also a very high risk due to her severe COPD.  After being evaluated at [facility], she was evaluated and treated by Pulmonary Medicine to optimize her pulmonary status prior to surgery.  She has a long history of smoking and was noted to have systemic PA pressures at the time of her right heart cath.  She also had paroxysmal atrial fibrillation which caused a clinical deterioration, and for that reason, a combined maze procedure was recommended by her cardiologist.  Prior to surgery, I examined the patient in the CCU on several occasions and met with the  patient's family for a formal preoperative conference. We discussed the indications, benefits, alternatives, and risks of mitral valve replacement and a combined maze procedure for treatment of her severe mitral stenosis and associated paroxysmal atrial fibrillation.  I reviewed the major aspects of surgery including the location of the surgical incision, the use of general anesthesia and cardiopulmonary bypass, and expected postoperative recovery.  The patient and family understood due to her severe lung disease and pulmonary hypertension, she would probably remain ventilated for 24-48 hours following surgery.  We also discussed the potential risks of RV dysfunction, the need for RVAD, the need for blood transfusion, requirements from bleeding as she has chronic anemia, and the risk of stroke and multisystem failure.  After reviewing all of these issues and discussing her options, the patient agreed to proceed with surgery as documented.  PROCEDURE:  The patient was brought to the operating room, placed supine on the operating table where general anesthesia was induced.  The chest, abdomen, and the legs were prepped with Betadine and draped as a sterile field.  A transesophageal 2-D echo probe was placed by the anesthesiologist.  This showed moderate-to-severe mitral stenosis and moderate mitral regurgitation.  There is mild-to-moderate TR.  Overall LVEF was 50%.  A sternal incision was then made after the patient was  prepped and the bypass lines were brought to the field.  A sternal retractor was placed.  The pericardium was opened, and mild amount of pericardial fluid was drained.  Both pleural spaces were also opened and pleural effusions were drained.  Heparin was administered and pursestrings were placed in the ascending aorta and superior vena cava and the lower right atrium at the inferior vena cava for cannulation for bypass.  After the ACT was documented as being therapeutic,  the patient was cannulated for cardiopulmonary bypass with the aortic cannula in the ascending aorta and bicaval venous drainage with caval tapes placed as well.  Cardioplegia catheters were placed both antegrade and retrograde cold blood cardioplegia.  A femoral A-line was placed for blood pressure monitoring.  The interatrial groove was dissected out.  The patient was cooled to 30 degrees Fahrenheit.  The crossclamp was applied, and 800 mL of cold blood cardioplegia was delivered in split doses between the antegrade aortic and retrograde coronary sinus catheters.  There was a good cardioplegic arrest and septal temperature dropped less than 14 degrees.  The left atrial maze procedure was then performed.  The heart was retracted to the right side.  The ligament of Ruthann Cancer was dissected off the junction of the superior pulmonary vein on the left and the left main pulmonary artery.  A vascular clamp was placed around the superior and inferior pulmonary venous confluence to the left atrium and a vessel loop was passed around this area.  Around the space held open by the vessel loop, the bipolar radiofrequency ablation clamp was placed and 2 ablation lines were placed at the junction of the left pulmonary veins and left atrium.  Next, the bipolar ablation line was placed at the base of the atrial appendage.  The heart was then placed back in the anatomic position.  The interatrial groove was dissected.  A left atriotomy was performed.  The atrial retractors were placed.  It was difficult to expose the valve due to the patient's body habitus and enlarged left atrium, but visibility was adequate.  The right-sided pulmonary venous maze procedure was completed using the unipolar pen to create an ablation line extending from the superior and inferior aspect of the atriotomy incision.  The anterior ablation line was the surgical incision, and the inferior ablation line was created by the  unipolar device using radiofrequency energy.  Next, 2 ablation lines were placed across the floor of the left atrium extending from the right-sided atriotomy to the encircling ablation line around the left-sided pulmonary veins.  Another ablation line was created from the left atriotomy at the caudal aspect to the mitral valve annulus at the P3 segment.  A third ablation line across the atrium between the encircling lesion on the right side and the atrial appendage was placed.  This completed the left atrial maze procedure.  I did not feel that opening the right atrium to perform maze ablation lines would be safe due to the extremely high right-sided pressures and the very thin right atrial tissue which would be high risk for bleeding.  Next, the mitral valve replacement was performed.  An incision was made in the anterior leaflet at the annulus and the anterior leaflet was excised.  The mitral valve was extremely stenotic, foreshortened with very thick subvalvular structures with fusion of the annulus and the leaflets to the papillary muscles.  This was all carefully dissected out and divided so as to free up the leaflet and annulus from the subvalvular  structures.  Care was taken not to separate the annulus from the ventricular muscle.  Sutures were placed around the superior aspect of the annulus using interrupted 2-0 Ethibond mattressed pledgeted sutures.  The posterior leaflet was especially difficult to excise as there was 1 big mass of scarred and calcified tissue from the annulus down to the papillary muscles.  However, this was successfully completed along a reasonable annulus for the suture placement around the inferior aspect of the annulus again using interrupted horizontal pledgeted 2-0 mattress sutures.  The annulus was then sized to a 27-gauge pericardial tissue valve (Medtronic Mosaic).  The valve was prepared according to protocol.  The valve was then seated over the  pledgeted sutures.  The sutures were carefully tied and valve was inspected.  There was good competency of the valve leaflets and the ventricle was filled with saline without evidence of leak.  The left atrial appendage was oversewn from the inside of the left atrium and then a Foley catheter was placed through the valve into the ventricle to decompress any air.  The atriotomy was closed with a running 4-0 Prolene as the patient was rewarmed.  Air was vented from the coronaries with a dose of retrograde warm blood cardioplegia and the usual de-airing maneuvers were performed as the crossclamp was removed.  The atriotomy incision was completed as the Foley catheter was removed and the second over-and-over suture line was used to reinforce the left atrial closure.  Heart resumed a spontaneous rhythm.  It should be noted that at this point nitric oxide at 20 parts per million was started in order to reduce pulmonary artery pressures and to optimize right ventricular function.  After the patient was rewarmed and reperfused, the lungs were reinflated and the ventilator was resumed again using nitric oxide to reduce PA pressures and to augment RV function.  The patient was also placed on low-dose dopamine and milrinone.  The heart was filled as the bypass surgery was weaned, and the patient came off bypass very well.  The echo showed good ventricular function with a normal functioning mitral valve. Protamine was administered without adverse reaction.  The cannulas were removed.  Mediastinum was irrigated with warm saline.  There was diffuse coagulopathy which responded to platelets and FFP with adequate clotting function following transfusion.  The patient remained hemodynamically stable.  The superior pericardium was closed.  A mediastinal and bilateral pleural tubes were placed and brought out through separate incisions.  The sternum was closed with interrupted steel wire.  The pectoralis  fascia was closed with running #1 Vicryl.  The subcutaneous and skin layers were closed with running Vicryl and sterile dressings were applied.  Total cardiopulmonary bypass time was 200 minutes.     [name], M.D.     PV/MEDQ  D:  09/20/2010  T:  09/21/2010  Job:  [id number]  cc:   Denice Bors. Stanford Breed, MD, [facility]  Electronically Signed by [name] M.D. on 09/23/2010 03:35:02 PM

## 2010-09-24 ENCOUNTER — Inpatient Hospital Stay (HOSPITAL_COMMUNITY): Payer: Medicare Other

## 2010-09-24 DIAGNOSIS — J90 Pleural effusion, not elsewhere classified: Secondary | ICD-10-CM

## 2010-09-24 HISTORY — PX: CHEST TUBE INSERTION: SHX231

## 2010-09-24 LAB — CBC
HCT: 25 % — ABNORMAL LOW (ref 36.0–46.0)
HCT: 27 % — ABNORMAL LOW (ref 36.0–46.0)
Hemoglobin: 8.4 g/dL — ABNORMAL LOW (ref 12.0–15.0)
Hemoglobin: 8.8 g/dL — ABNORMAL LOW (ref 12.0–15.0)
MCH: 28.5 pg (ref 26.0–34.0)
MCH: 27.7 pg (ref 26.0–34.0)
MCHC: 33.6 g/dL (ref 30.0–36.0)
MCHC: 32.6 g/dL (ref 30.0–36.0)
MCV: 84.7 fL (ref 78.0–100.0)
MCV: 84.9 fL (ref 78.0–100.0)
Platelets: 176 10*3/uL (ref 150–400)
Platelets: 217 10*3/uL (ref 150–400)
RBC: 2.95 MIL/uL — ABNORMAL LOW (ref 3.87–5.11)
RBC: 3.18 MIL/uL — ABNORMAL LOW (ref 3.87–5.11)
RDW: 16.5 % — ABNORMAL HIGH (ref 11.5–15.5)
RDW: 16.4 % — ABNORMAL HIGH (ref 11.5–15.5)
WBC: 12.1 10*3/uL — ABNORMAL HIGH (ref 4.0–10.5)
WBC: 12.9 10*3/uL — ABNORMAL HIGH (ref 4.0–10.5)

## 2010-09-24 LAB — TYPE AND SCREEN
ABO/RH(D): A NEG
Antibody Screen: NEGATIVE
Unit division: 0
Unit division: 0

## 2010-09-24 LAB — CULTURE, RESPIRATORY W GRAM STAIN

## 2010-09-24 LAB — COMPREHENSIVE METABOLIC PANEL
ALT: 185 U/L — ABNORMAL HIGH (ref 0–35)
AST: 90 U/L — ABNORMAL HIGH (ref 0–37)
Albumin: 2.4 g/dL — ABNORMAL LOW (ref 3.5–5.2)
Alkaline Phosphatase: 60 U/L (ref 39–117)
BUN: 31 mg/dL — ABNORMAL HIGH (ref 6–23)
CO2: 28 mEq/L (ref 19–32)
Calcium: 8.1 mg/dL — ABNORMAL LOW (ref 8.4–10.5)
Chloride: 99 mEq/L (ref 96–112)
Creatinine, Ser: 0.87 mg/dL (ref 0.50–1.10)
GFR calc Af Amer: >60 mL/min (ref >60)
GFR calc non Af Amer: >60 mL/min (ref >60)
Glucose, Bld: 133 mg/dL — ABNORMAL HIGH (ref 70–99)
Potassium: 3.7 mEq/L (ref 3.5–5.1)
Sodium: 136 mEq/L (ref 135–145)
Total Bilirubin: 0.5 mg/dL (ref 0.3–1.2)
Total Protein: 5.4 g/dL — ABNORMAL LOW (ref 6.0–8.3)

## 2010-09-24 LAB — POCT I-STAT 3, ART BLOOD GAS (G3+)
Acid-Base Excess: 4 mmol/L — ABNORMAL HIGH (ref 0.0–2.0)
Acid-Base Excess: 6 mmol/L — ABNORMAL HIGH (ref 0.0–2.0)
Acid-base deficit: 8 mmol/L — ABNORMAL HIGH (ref 0.0–2.0)
Bicarbonate: 28.7 mEq/L — ABNORMAL HIGH (ref 20.0–24.0)
Bicarbonate: 16.8 mEq/L — ABNORMAL LOW (ref 20.0–24.0)
Bicarbonate: 30.7 mEq/L — ABNORMAL HIGH (ref 20.0–24.0)
O2 Saturation: 97 %
O2 Saturation: 100 %
O2 Saturation: 100 %
Patient temperature: 97.8
Patient temperature: 98.9
Patient temperature: 98.4
TCO2: 30 mmol/L (ref 0–100)
TCO2: 18 mmol/L (ref 0–100)
TCO2: 32 mmol/L (ref 0–100)
pCO2 arterial: 41 mmHg (ref 35.0–45.0)
pCO2 arterial: 30.5 mmHg — ABNORMAL LOW (ref 35.0–45.0)
pCO2 arterial: 45.8 mmHg — ABNORMAL HIGH (ref 35.0–45.0)
pH, Arterial: 7.451 — ABNORMAL HIGH (ref 7.350–7.400)
pH, Arterial: 7.351 (ref 7.350–7.400)
pH, Arterial: 7.434 — ABNORMAL HIGH (ref 7.350–7.400)
pO2, Arterial: 88 mmHg (ref 80.0–100.0)
pO2, Arterial: 175 mmHg — ABNORMAL HIGH (ref 80.0–100.0)
pO2, Arterial: 196 mmHg — ABNORMAL HIGH (ref 80.0–100.0)

## 2010-09-24 LAB — PRO B NATRIURETIC PEPTIDE
Pro B Natriuretic peptide (BNP): 2839 pg/mL — ABNORMAL HIGH (ref 0–125)
Pro B Natriuretic peptide (BNP): 2093 pg/mL — ABNORMAL HIGH (ref 0–125)

## 2010-09-24 LAB — GLUCOSE, CAPILLARY
Glucose-Capillary: 103 mg/dL — ABNORMAL HIGH (ref 70–99)
Glucose-Capillary: 125 mg/dL — ABNORMAL HIGH (ref 70–99)
Glucose-Capillary: 135 mg/dL — ABNORMAL HIGH (ref 70–99)
Glucose-Capillary: 145 mg/dL — ABNORMAL HIGH (ref 70–99)
Glucose-Capillary: 94 mg/dL (ref 70–99)
Glucose-Capillary: 94 mg/dL (ref 70–99)

## 2010-09-24 LAB — GRAM STAIN

## 2010-09-24 NOTE — Discharge Summary (Signed)
[name], BROEKER NO.:  [id number]  MEDICAL RECORD NO.:  [id number]  LOCATION:                                 FACILITY:  PHYSICIAN:  [name], M.D.  DATE OF BIRTH:  1951-01-23  DATE OF ADMISSION:  08/03/2010 DATE OF DISCHARGE:  08/07/2010                              DISCHARGE SUMMARY   DISCHARGE DIAGNOSES: 1. Gait instability - unclear etiology, may be contributed by     autonomic instability in the setting of severe peripheral vascular     disease, diabetes mellitus, tobacco abuse.  TIA could not be ruled     out, although, less likely.  MRI indicates no evidence of stroke. 2. Diabetes mellitus type 2 - hemoglobin A1c 9.3 in May 2012.  Basal     insulin therapy escalated secondary to inadequate control of blood     sugars during hospital course. 3. Tobacco abuse, ongoing - counseled for smoking cessation. 4. Depression. 5. Chronic pain/fibromyalgia. 6. Chronic obstructive pulmonary disease without requirement of home     oxygen therapy - recommended smoking cessation. 7. Superficial thrombophlebitis, left arm, Doppler ultrasound negative     for deep vein thrombosis. 8. Obstructive sleep apnea, on home CPAP. 9. Gastroesophageal reflux disease. 10.Hyperlipidemia. 11.Bilateral carotid artery stenosis status post right carotid     endarterectomy with patch angioplasty on Jul 04, 2010.  Carotid     ultrasound on August 04, 2010 indicating right mild plaque noted.     60%-79% ICA stenosis, mid range of scale by velocity.  Left:     Moderate-to-severe calcific and non-calcific plaque origin and     proximal ICA and ECA.  60%-79% ICA stenosis, highest end of scale.  DISCHARGE MEDICATIONS: 1. MiraLax 17 grams by mouth daily as needed for constipation. 2. Lantus 50 units subcutaneously every evening. 3. Advair 250/50 one puff inhaled twice daily. 4. Albuterol 2 puffs every 4 hours as needed for shortness of breath. 5. Alprazolam 1 mg by mouth twice  daily as needed. 6. Aspirin 81 mg by mouth daily. 7. Bee pollen one tablet by mouth daily. 8. Crestor one tablet by mouth daily. 9. Cymbalta 30 mg three capsules by mouth every morning. 10.Dexilant 60 mg by mouth daily. 11.Colace one capsule twice daily. 12.Lisinopril/HCTZ 10/12.5 mg by mouth every morning. 13.Loratadine 10 mg by mouth every evening. 14.Norco 10/325 mg by mouth every 6 hours as needed. 15.NovoLog 3-7 units sliding scale three times a day before meals as     needed. 16.Spiriva 18 mcg one capsule inhaled daily. 17.Wellbutrin XL 150 mg one tablet by mouth daily every evening.  DISPOSITION AND FOLLOWUP:  The patient is to follow up in the [facility] on August 29, 2010 at 1:45 p.m. with Dr. Guy Horn, at which time, capillary blood glucose should be evaluated as well as glucometer log to assess the patient's blood sugar control as she was determined to have  inadequate blood sugar control during the hospital course and thereby escalated her Lantus therapy. The patient has been instructed to check her blood sugars regularly, therefore, on followup please evaluate to determine if she needs further adjustment of her basal  versus sliding scale insulin as indicated.  The patient's blood pressure should also be assessed as she had mildly soft blood pressures during the hospital course into the high 64Q systolic on her home medication regimen, and she preferred to continue the regimen and was asymptomatic with such blood pressures.  Therefore, please reevaluate to determine if becoming symptomatic with consideration for de-escalation of therapy if indicated.  The patient's left arm thrombophlebitis should also be reevaluated to know for improvement and that there is no progression to infectious state. Lastly, the patient should be continued towards smoking cessation.  The patient is also to follow up with Dr. Stanford Horn of [facility]  on August 12, 2010 at 10:45 a.m., at which time, the patient's mitral valve disease and blood pressure will be reevaluated with management as indicated.  PROCEDURES PERFORMED: 1. CT head without contrast - August 02, 2010 - normal exam. 2. MRI brain without contrast - August 03, 2010 - no acute intracranial     abnormality.  Nonspecific moderate signal changes in brainstem     white matter.  Favor chronic small vessel disease in light of small     chronic lacunar infarct in the right caudate nucleus. 3. MRI neck with and without contrast - August 03, 2010 - postoperative     change in the right carotid bifurcation with no adverse features.     Left ICA stenosis, 12-mm beyond its origin with discordant     appearance on post contrast versus time-of-flight MRA images.  I     suspected the moderate, but not hemodynamically significant     stenosis.  Dominant with vertebral artery.  Possible stenosis of     the distal most right vertebral artery, negative intracranial MRA.     MRA neck with and without contrast - August 03, 2010 - negative     intracranial MRA. 4. Carotid ultrasound - August 04, 2010 - right mild plaque noted.  60%-     79% ICA stenosis, mid range of scale by velocity.  Left:  Moderate-     to-severe calcific and non-calcific plaque origin and proximal ICA     and ECA.  60%-79% ICA stenosis, highest end of the scale. 5. Left arm Doppler ultrasound - August 06, 2010 - no evidence of deep     vein thrombosis involving the left upper extremity.  Findings     consistent with superficial vein thrombosis included involving the     left cephalic vein.   CONSULTATIONS: 1. Vein and Vascular Surgery - Dr. Bridgett Horn. 2. Cardiology - Dr. Stanford Horn.  ADMISSION HISTORY AND PHYSICAL:  [name] is a 60 year old woman, who presents to [facility] ER because of gait instability.  She has a past medical history significant for tobacco abuse, currently one half pack per day, down from two packs per day,  diabetes with her last A1c of 8.3, hypertension, hyperlipidemia, on Crestor, OSA, on CPAP, and recent carotid endarterectomy done by Dr. Trula Horn on Jul 04, 2010.  She states she woke up on the morning of admission, when she went to stand up, fell back down on to the bed.  She got up again and felt unsteady and lightheaded on her feet.  She states that she was moving to the right down to the hall up to her kitchen.  She was able to get [name] that she does not use oxygen and with that was able to get around for the rest of the morning.  She took  her morning medication and 6 units of NovaLog and ate her breakfast.  She denied any numbness, tingling, weakness, slurred speech, problems with word finding, syncope.  She denied any fevers, chills, nausea, vomiting, chest, or abdominal pain. She has had a headache on the day of admission.  The pain is all over and is an ache that gets worse with light and loud sounds.  She took a nap and when she woke up she felt worse, so she called her vascular surgeon's office and they suggested her to come into the ER for workup of possible stroke.  PHYSICAL EXAM:  VITAL SIGNS:  On admission, temperature 98.1, blood pressure 116/61, heart rate 83, respirations 16, O2 saturation 98% on room air. GENERAL:  Alert and oriented x3.  Elderly woman, well developed, and cooperative to exam. HEAD:  Normocephalic, atraumatic. EYES:  PERRL, EOMI.  Vision grossly intact. NECK:  Supple.  Full range of motion.  No thyromegaly.  No JVD.  No carotid bruits. LUNGS:  Clear to auscultation bilaterally.  Normal respiratory effort. HEART:  Normal rate and rhythm.  No murmurs, rubs, or gallops. ABDOMEN:  Soft, nontender, nondistended.  Normoactive bowel sounds. MUSCULOSKELETAL:  No joint swelling.  No joint warmth or redness over joints. NEUROLOGIC:  Alert and oriented x3.  Cranial nerves II-XII grossly intact.  Strength normal in all four extremities.  Sensation is  intact to light touch.  Romberg positive for instability.  Gait not assessed because of instability.  LABS ON ADMISSION:  CBC:  WBC 7.1, hemoglobin 11.6, hematocrit 34.9, platelets 192.  BMET:  Sodium 134, potassium 3.9, chloride 99, bicarb 29, glucose 183, BUN 15, creatinine 0.8.  PT/INR 13.3/0.99.  HOSPITAL COURSE BY PROBLEM: 1. Gait instability - likely secondary to autonomic instability in the     setting of chronic tobacco abuse, diabetes mellitus, elderly age,     severe peripheral vascular disease.  TIA cannot be ruled out.  MRI     negative for evidence of acute stroke.  On admission as the     patient's findings were concerning for stroke, particularly in the     setting of her multiple risk factors, stroke workup was initiated     with MRI, carotid ultrasound, and the patient was provided with     aspirin therapy and resumed of her statin therapy.  MRI results     indicated no acute infarct.  However, symptomatology was still     concerning for TIA, in the setting of known carotid artery disease     and recent carotid endarterectomy.  Vascular Surgery, Dr. Bridgett Horn, who     consulted and felt that symptoms were not indicative of TIA and     that they may represent autonomic instability versus possible     cardiologic source.  He further did not feel that change from     aspirin therapy to Plavix would be indicated at this time and     therefore did not recommend for this change to occurred.  [facility], with whom the patient follows as an outpatient was     consulted.  Dr. Percival Spanish briefly evaluated the patient and     indicated that outpatient Cardiology workup would be sufficient for     this patient. 2. Left upper extremity superficial vein thrombophlebitis - the     patient had insertion of peripheral IV on the day of admission     subsequent to which the superficial thrombophlebitis occurred after  the IV line was discontinued, Doppler ultrasound was ordered  and     was negative for evidence of deep vein thrombosis.  The patient was     recommended to keep arm elevated as much as possible as well as to     continue warm compresses.  She will follow up with this as an     outpatient. 3. Bilateral carotid artery stenosis status post right carotid     endarterectomy on Jul 04, 2010 by Dr. Trula Horn - as aforementioned,     the patient had carotid ultrasound with results as indicated above.     Vascular Surgery did evaluate the patient during the hospital     course and did not feel that her carotid artery disease was     contributing towards her gait instability.  They did not recommend     switch from aspirin to Plavix, therefore she is continued only on     her aspirin therapy. 4. Diabetes mellitus type 2 - hemoglobin A1c 9.3.  The patient had     persistently elevated blood sugars into the 200s and 300s.     Therefore, her home insulin regimen was escalated and she will be     discharged on an increased dose of her home Lantus.  She will need     continued monitoring as an outpatient with consideration of further     escalation as indicated. 5. Hypertension - the patient had mildly soft blood pressures in the     high 90s during the hospital course.  However, she remained     asymptomatic with these blood pressures and was not orthostatic.     Therefore, she was continued on her home lisinopril and     hydrochlorothiazide per request of the patient. 6. Tobacco abuse - the patient confirmed ongoing half pack per day     smoking, which is down from her two pack per day which she was     previously using.  She was continuously encouraged to pursue     smoking cessation, however, she states she is not ready to pursue     pharmacologic intervention to aiding the goal.  She will have     further smoking cessation counseling as an outpatient. 7. Depression - no acute issue during hospital course, continued on     home regimen. 8. Chronic pain -  fibromyalgia - continued on home regimen. 9. COPD, without acute exacerbation.  The patient did not require home     oxygen therapy.  Again, we continued to encourage smoking     cessation. 10.Obstructive sleep apnea, on home CPAP at night - continue CPAP     nightly during hospital course. 11.GERD - stable - continued Protonix. 12.Hyperlipidemia - continued home medications.  VITAL SIGNS ON DISCHARGE:  Temperature 98.5, pulse 67, respirations 22, blood pressure 120/64, O2 saturation 94% on room air.  DISCHARGE LABS:  BMET:  Sodium 137, potassium 0.8, chloride 97, bicarb 32, glucose 138, BUN 18, creatinine 0.8.  CBC:  WBC 6.6, hemoglobin 10.3, hematocrit 32, platelets 157.    ______________________________ [name], DO   ______________________________ [name], M.D.    SK/MEDQ  D:  08/27/2010  T:  08/28/2010  Job:  [id number]  cc:   Denice Bors. Kathryn Breed, MD, [place] Leia Alf, MD Trinidad Curet, MD  Electronically Signed by [name] DO on 08/28/2010 01:26:18 PM Electronically Signed by Bertha Stakes M.D. on 09/24/2010 05:27:10 PM

## 2010-09-25 ENCOUNTER — Inpatient Hospital Stay (HOSPITAL_COMMUNITY): Payer: Medicare Other

## 2010-09-25 DIAGNOSIS — IMO0001 Reserved for inherently not codable concepts without codable children: Secondary | ICD-10-CM

## 2010-09-25 DIAGNOSIS — E1165 Type 2 diabetes mellitus with hyperglycemia: Secondary | ICD-10-CM

## 2010-09-25 LAB — BLOOD GAS, ARTERIAL
Acid-Base Excess: 4.1 mmol/L — ABNORMAL HIGH (ref 0.0–2.0)
Bicarbonate: 26.6 mEq/L — ABNORMAL HIGH (ref 20.0–24.0)
Drawn by: 283401
FIO2: 0.4 %
MECHVT: 500 mL
O2 Saturation: 100 %
PEEP: 5 cmH2O
Patient temperature: 98.6
RATE: 20 resp/min
TCO2: 27.5 mmol/L (ref 0–100)
pCO2 arterial: 29.8 mmHg — ABNORMAL LOW (ref 35.0–45.0)
pH, Arterial: 7.56 — ABNORMAL HIGH (ref 7.350–7.400)
pO2, Arterial: 255 mmHg — ABNORMAL HIGH (ref 80.0–100.0)

## 2010-09-25 LAB — POCT I-STAT 3, ART BLOOD GAS (G3+)
Acid-Base Excess: 3 mmol/L — ABNORMAL HIGH (ref 0.0–2.0)
Acid-base deficit: 2 mmol/L (ref 0.0–2.0)
Bicarbonate: 22 mEq/L (ref 20.0–24.0)
Bicarbonate: 27.6 mEq/L — ABNORMAL HIGH (ref 20.0–24.0)
O2 Saturation: 100 %
O2 Saturation: 99 %
Patient temperature: 98
Patient temperature: 98
TCO2: 23 mmol/L (ref 0–100)
TCO2: 29 mmol/L (ref 0–100)
pCO2 arterial: 31.8 mmHg — ABNORMAL LOW (ref 35.0–45.0)
pCO2 arterial: 38.3 mmHg (ref 35.0–45.0)
pH, Arterial: 7.447 — ABNORMAL HIGH (ref 7.350–7.400)
pH, Arterial: 7.464 — ABNORMAL HIGH (ref 7.350–7.400)
pO2, Arterial: 183 mmHg — ABNORMAL HIGH (ref 80.0–100.0)
pO2, Arterial: 144 mmHg — ABNORMAL HIGH (ref 80.0–100.0)

## 2010-09-25 LAB — GLUCOSE, CAPILLARY
Glucose-Capillary: 106 mg/dL — ABNORMAL HIGH (ref 70–99)
Glucose-Capillary: 116 mg/dL — ABNORMAL HIGH (ref 70–99)
Glucose-Capillary: 128 mg/dL — ABNORMAL HIGH (ref 70–99)
Glucose-Capillary: 130 mg/dL — ABNORMAL HIGH (ref 70–99)
Glucose-Capillary: 109 mg/dL — ABNORMAL HIGH (ref 70–99)

## 2010-09-25 LAB — COMPREHENSIVE METABOLIC PANEL
ALT: 187 U/L — ABNORMAL HIGH (ref 0–35)
AST: 69 U/L — ABNORMAL HIGH (ref 0–37)
Albumin: 2.3 g/dL — ABNORMAL LOW (ref 3.5–5.2)
Alkaline Phosphatase: 62 U/L (ref 39–117)
BUN: 27 mg/dL — ABNORMAL HIGH (ref 6–23)
CO2: 28 mEq/L (ref 19–32)
Calcium: 8.4 mg/dL (ref 8.4–10.5)
Chloride: 100 mEq/L (ref 96–112)
Creatinine, Ser: 0.76 mg/dL (ref 0.50–1.10)
GFR calc Af Amer: >60 mL/min (ref >60)
GFR calc non Af Amer: >60 mL/min (ref >60)
Glucose, Bld: 98 mg/dL (ref 70–99)
Potassium: 3.7 mEq/L (ref 3.5–5.1)
Sodium: 135 mEq/L (ref 135–145)
Total Bilirubin: 0.5 mg/dL (ref 0.3–1.2)
Total Protein: 5.2 g/dL — ABNORMAL LOW (ref 6.0–8.3)

## 2010-09-25 LAB — CBC
HCT: 24.7 % — ABNORMAL LOW (ref 36.0–46.0)
Hemoglobin: 8 g/dL — ABNORMAL LOW (ref 12.0–15.0)
MCH: 27.6 pg (ref 26.0–34.0)
MCHC: 32.4 g/dL (ref 30.0–36.0)
MCV: 85.2 fL (ref 78.0–100.0)
Platelets: 215 10*3/uL (ref 150–400)
RBC: 2.9 MIL/uL — ABNORMAL LOW (ref 3.87–5.11)
RDW: 16.4 % — ABNORMAL HIGH (ref 11.5–15.5)
WBC: 9.5 10*3/uL (ref 4.0–10.5)

## 2010-09-25 LAB — PREPARE RBC (CROSSMATCH)

## 2010-09-26 ENCOUNTER — Encounter: Payer: Medicare Other | Admitting: Internal Medicine

## 2010-09-26 ENCOUNTER — Inpatient Hospital Stay (HOSPITAL_COMMUNITY): Payer: Medicare Other

## 2010-09-26 LAB — BASIC METABOLIC PANEL
BUN: 23 mg/dL (ref 6–23)
CO2: 30 mEq/L (ref 19–32)
Calcium: 8.1 mg/dL — ABNORMAL LOW (ref 8.4–10.5)
Chloride: 98 mEq/L (ref 96–112)
Creatinine, Ser: 0.84 mg/dL (ref 0.50–1.10)
GFR calc Af Amer: >60 mL/min (ref >60)
GFR calc non Af Amer: >60 mL/min (ref >60)
Glucose, Bld: 131 mg/dL — ABNORMAL HIGH (ref 70–99)
Potassium: 3.5 mEq/L (ref 3.5–5.1)
Sodium: 134 mEq/L — ABNORMAL LOW (ref 135–145)

## 2010-09-26 LAB — GLUCOSE, CAPILLARY
Glucose-Capillary: 134 mg/dL — ABNORMAL HIGH (ref 70–99)
Glucose-Capillary: 136 mg/dL — ABNORMAL HIGH (ref 70–99)
Glucose-Capillary: 149 mg/dL — ABNORMAL HIGH (ref 70–99)
Glucose-Capillary: 154 mg/dL — ABNORMAL HIGH (ref 70–99)
Glucose-Capillary: 159 mg/dL — ABNORMAL HIGH (ref 70–99)
Glucose-Capillary: 161 mg/dL — ABNORMAL HIGH (ref 70–99)

## 2010-09-26 LAB — CBC
HCT: 30.7 % — ABNORMAL LOW (ref 36.0–46.0)
Hemoglobin: 9.9 g/dL — ABNORMAL LOW (ref 12.0–15.0)
MCH: 28.3 pg (ref 26.0–34.0)
MCHC: 32.2 g/dL (ref 30.0–36.0)
MCV: 87.7 fL (ref 78.0–100.0)
Platelets: 221 10*3/uL (ref 150–400)
RBC: 3.5 MIL/uL — ABNORMAL LOW (ref 3.87–5.11)
RDW: 16.3 % — ABNORMAL HIGH (ref 11.5–15.5)
WBC: 11.6 10*3/uL — ABNORMAL HIGH (ref 4.0–10.5)

## 2010-09-26 LAB — TYPE AND SCREEN
ABO/RH(D): A NEG
Antibody Screen: NEGATIVE
Unit division: 0

## 2010-09-26 LAB — PROTIME-INR
INR: 1.3 (ref 0.00–1.49)
Prothrombin Time: 16.4 seconds — ABNORMAL HIGH (ref 11.6–15.2)

## 2010-09-26 NOTE — Op Note (Signed)
[name], Horn NO.:  [id number]  MEDICAL RECORD NO.:  [id number]  LOCATION:  2316                         FACILITY:  [facility]  PHYSICIAN:  Ala Dach, M.D.DATE OF BIRTH:  Dec 03, 1950  DATE OF PROCEDURE:  09/20/2010 DATE OF DISCHARGE:                              OPERATIVE REPORT   SURGEON:  Ala Dach, MD  PROCEDURE:  Intraoperative transesophageal echocardiography.  INDICATIONS FOR PROCEDURE:  [name] presents today for mitral valve replacement and Maze procedure to be performed by Dr. Prescott Gum.  She presents to the holding area at the morning of surgery under local anesthesia with sedation and pulmonary artery catheter and radial arterial lines were placed.  She was then taken to the OR for routine induction of general anesthesia, after which, the TEE probe is prepared and passed oropharyngeally into the stomach, then slightly withdrawn for imaging of the cardiac structures.  PRECARDIOPULMONARY BYPASS TEE EXAMINATION:  Left ventricle:  The left ventricular chamber is seen initially in a short axis view.  Immediately appearance is that there is a small posterior effusion appearance. There is mildly thickened LV chamber somewhat asymmetric in its hypertrophy.  There is mildly diminished LV function appreciated in the short axis view.  There is a flattened septum slightly in appearance in the four-chamber view and in a short axis view.  Long-axis view again is obtained showing somewhat satisfactory contractility to the apex. Mitral valve:  The mitral valve apparatus is thickened in both leaflets anterior and posterior.  There is significant thickening appreciated. There is decreased mobility appreciated, essentially fixed aspect of the posterior leaflet in the four-chamber view.  Color Doppler reveals mild obstruction to inflow and color Doppler reveals stenotic and ring for stenotic and turbulent flow through the inflow of the mitral  inlet associated with narrowing of the orifice.  Color Doppler and pulse wave Doppler is used to evaluate the mitral valve.  This shows a peak gradient of 21-23, mean gradient of 7-8.  By pressure half-time calculations, the area of the mitral valve is calculated to be 1.5 centimeter squared.  Multiple views are obtained of the mitral valve, all leaflets are thickening and the posterior leaflet is essentially quite fixed in its excursion.  Also, 3-D views are obtained, which again shows a thickened anterior leaflet, but somewhat mobile anterior leaflet and the essentially fixed just of the posterior leaflet. Left atrium:  The left atrial chamber is seen in the four-chamber view. It is significantly enlarged.  At its largest diameter, it is likely to be just over 6 cm in diameter.  Color Doppler over the top of the left atrial chamber in this view shows a central regurgitant jet that would be considered moderate for moderate mitral regurgitation.  Multiple views are carried out within the enlarged left atrial chamber; however, we could not access pulmonary vein inlet.  The inner-atrial septum is interrogated and appears to be intact, but slightly bulging left-to- right. Right ventricle:  The right ventricular chamber is significantly enlarged, but the apex of the right ventricular chamber reaching nearly to the apex of the left ventricular chamber, it is virtually the size of the left ventricular chamber indicating  its enlargement and the septal area is essentially flattened, which is an indication of pressure overload and enlargement of the right ventricular chamber. Tricuspid valve:  This is a thin, compliant, mobile apparatus.  All leaflets were seen, edges appear mobile, it appears functionally normal; however, on color Doppler, there is a mild-to-moderate eccentric jet tricuspid regurgitant flow appreciated. Right atrium:  The right atrium is noted.  PA catheter is noted  within. Aortic valve:  She has aortic sclerosis; however, there is three cusps, they appear to open appropriately during the systolic contraction. During diastole, there is essentially no aortic insufficiency appreciated other than some mild aortic sclerosis.  This is essentially normal aortic valve.  The patient is placed on cardiopulmonary bypass.  The procedure is excision of the diseased mitral valve with replacement with a tissue valve apparatus.  Maze procedure is carried out.  De-airing maneuvers are carried out.  Inotropes are begun and the patient is prepared for separation of cardiopulmonary bypass, which is successful with the initial attempt.  POSTCARDIOPULMONARY BYPASS TEE EXAMINATION (LIMITED EXAM WITH ATTENTION TO THE MITRAL VALVE):  Mitral valve:  The four-chamber view now shows the mitral valve, tissue valve seated well in the mitral annular area. The thin leaflet edges are able to be seen and visualized with ease. This appears to be satisfactorily placed.  Color Doppler.  Color Doppler across this valve shows no regurgitant flow and no obstruction to inflow from left atrium to the left ventricular chamber.  This appears to be as satisfactory repair.  The rest of the cardiac examination was essentially unchanged from previous.  The left atrial chamber remained enlarged.  The left ventricular chamber itself had good overall contractility after the initiation inotropes.  No masses were appreciated within.  No significant changes were noted in the right side with a right ventricular area that remains enlarged.  However, there was some suggestion of a diminution of the tricuspid regurgitant jet in this postbypass.  The patient subsequently did well and was returned to the Cardiac Intensive Care Unit in stable condition.          ______________________________ Ala Dach, M.D.     JTM/MEDQ  D:  09/20/2010  T:  09/21/2010  Job:  [id number]  Electronically  Signed by Greta Doom M.D. on 09/26/2010 01:13:23 AM

## 2010-09-27 ENCOUNTER — Inpatient Hospital Stay (HOSPITAL_COMMUNITY): Payer: Medicare Other

## 2010-09-27 LAB — BASIC METABOLIC PANEL
BUN: 24 mg/dL — ABNORMAL HIGH (ref 6–23)
CO2: 32 mEq/L (ref 19–32)
Calcium: 8.4 mg/dL (ref 8.4–10.5)
Chloride: 98 mEq/L (ref 96–112)
Creatinine, Ser: 0.75 mg/dL (ref 0.50–1.10)
GFR calc Af Amer: >60 mL/min (ref >60)
GFR calc non Af Amer: >60 mL/min (ref >60)
Glucose, Bld: 130 mg/dL — ABNORMAL HIGH (ref 70–99)
Potassium: 3.8 mEq/L (ref 3.5–5.1)
Sodium: 136 mEq/L (ref 135–145)

## 2010-09-27 LAB — GLUCOSE, CAPILLARY
Glucose-Capillary: 114 mg/dL — ABNORMAL HIGH (ref 70–99)
Glucose-Capillary: 151 mg/dL — ABNORMAL HIGH (ref 70–99)
Glucose-Capillary: 157 mg/dL — ABNORMAL HIGH (ref 70–99)
Glucose-Capillary: 188 mg/dL — ABNORMAL HIGH (ref 70–99)
Glucose-Capillary: 188 mg/dL — ABNORMAL HIGH (ref 70–99)

## 2010-09-27 LAB — CBC
HCT: 31.5 % — ABNORMAL LOW (ref 36.0–46.0)
Hemoglobin: 10.3 g/dL — ABNORMAL LOW (ref 12.0–15.0)
MCH: 28.9 pg (ref 26.0–34.0)
MCHC: 32.7 g/dL (ref 30.0–36.0)
MCV: 88.2 fL (ref 78.0–100.0)
Platelets: 287 10*3/uL (ref 150–400)
RBC: 3.57 MIL/uL — ABNORMAL LOW (ref 3.87–5.11)
RDW: 16.6 % — ABNORMAL HIGH (ref 11.5–15.5)
WBC: 13.2 10*3/uL — ABNORMAL HIGH (ref 4.0–10.5)

## 2010-09-27 LAB — CULTURE, RESPIRATORY W GRAM STAIN

## 2010-09-27 LAB — PROTIME-INR
INR: 1.23 (ref 0.00–1.49)
Prothrombin Time: 15.8 seconds — ABNORMAL HIGH (ref 11.6–15.2)

## 2010-09-28 ENCOUNTER — Inpatient Hospital Stay (HOSPITAL_COMMUNITY): Payer: Medicare Other

## 2010-09-28 LAB — CBC
HCT: 30.3 % — ABNORMAL LOW (ref 36.0–46.0)
Hemoglobin: 9.7 g/dL — ABNORMAL LOW (ref 12.0–15.0)
MCH: 28.1 pg (ref 26.0–34.0)
MCHC: 32 g/dL (ref 30.0–36.0)
MCV: 87.8 fL (ref 78.0–100.0)
Platelets: 292 10*3/uL (ref 150–400)
RBC: 3.45 MIL/uL — ABNORMAL LOW (ref 3.87–5.11)
RDW: 16.6 % — ABNORMAL HIGH (ref 11.5–15.5)
WBC: 13.5 10*3/uL — ABNORMAL HIGH (ref 4.0–10.5)

## 2010-09-28 LAB — GLUCOSE, CAPILLARY
Glucose-Capillary: 122 mg/dL — ABNORMAL HIGH (ref 70–99)
Glucose-Capillary: 126 mg/dL — ABNORMAL HIGH (ref 70–99)
Glucose-Capillary: 167 mg/dL — ABNORMAL HIGH (ref 70–99)
Glucose-Capillary: 175 mg/dL — ABNORMAL HIGH (ref 70–99)
Glucose-Capillary: 179 mg/dL — ABNORMAL HIGH (ref 70–99)
Glucose-Capillary: 76 mg/dL (ref 70–99)

## 2010-09-28 LAB — BODY FLUID CULTURE: Culture: NO GROWTH

## 2010-09-28 LAB — BASIC METABOLIC PANEL
BUN: 21 mg/dL (ref 6–23)
CO2: 31 mEq/L (ref 19–32)
Calcium: 8.5 mg/dL (ref 8.4–10.5)
Chloride: 98 mEq/L (ref 96–112)
Creatinine, Ser: 0.85 mg/dL (ref 0.50–1.10)
GFR calc Af Amer: >60 mL/min (ref >60)
GFR calc non Af Amer: >60 mL/min (ref >60)
Glucose, Bld: 74 mg/dL (ref 70–99)
Potassium: 3.8 mEq/L (ref 3.5–5.1)
Sodium: 135 mEq/L (ref 135–145)

## 2010-09-28 LAB — PROTIME-INR
INR: 1.29 (ref 0.00–1.49)
Prothrombin Time: 16.3 seconds — ABNORMAL HIGH (ref 11.6–15.2)

## 2010-09-29 ENCOUNTER — Inpatient Hospital Stay (HOSPITAL_COMMUNITY): Payer: Medicare Other

## 2010-09-29 LAB — PROTIME-INR
INR: 1.46 (ref 0.00–1.49)
Prothrombin Time: 18 seconds — ABNORMAL HIGH (ref 11.6–15.2)

## 2010-09-29 LAB — CBC
HCT: 29.9 % — ABNORMAL LOW (ref 36.0–46.0)
Hemoglobin: 9.5 g/dL — ABNORMAL LOW (ref 12.0–15.0)
MCH: 28.1 pg (ref 26.0–34.0)
MCHC: 31.8 g/dL (ref 30.0–36.0)
MCV: 88.5 fL (ref 78.0–100.0)
Platelets: 284 10*3/uL (ref 150–400)
RBC: 3.38 MIL/uL — ABNORMAL LOW (ref 3.87–5.11)
RDW: 16.9 % — ABNORMAL HIGH (ref 11.5–15.5)
WBC: 11.1 10*3/uL — ABNORMAL HIGH (ref 4.0–10.5)

## 2010-09-29 LAB — BASIC METABOLIC PANEL
BUN: 24 mg/dL — ABNORMAL HIGH (ref 6–23)
CO2: 31 mEq/L (ref 19–32)
Calcium: 8.9 mg/dL (ref 8.4–10.5)
Chloride: 96 mEq/L (ref 96–112)
Creatinine, Ser: 0.94 mg/dL (ref 0.50–1.10)
GFR calc Af Amer: >60 mL/min (ref >60)
GFR calc non Af Amer: >60 mL/min (ref >60)
Glucose, Bld: 167 mg/dL — ABNORMAL HIGH (ref 70–99)
Potassium: 3.7 mEq/L (ref 3.5–5.1)
Sodium: 133 mEq/L — ABNORMAL LOW (ref 135–145)

## 2010-09-29 LAB — GLUCOSE, CAPILLARY
Glucose-Capillary: 169 mg/dL — ABNORMAL HIGH (ref 70–99)
Glucose-Capillary: 110 mg/dL — ABNORMAL HIGH (ref 70–99)
Glucose-Capillary: 157 mg/dL — ABNORMAL HIGH (ref 70–99)
Glucose-Capillary: 125 mg/dL — ABNORMAL HIGH (ref 70–99)

## 2010-09-30 DIAGNOSIS — E1165 Type 2 diabetes mellitus with hyperglycemia: Secondary | ICD-10-CM

## 2010-09-30 DIAGNOSIS — IMO0001 Reserved for inherently not codable concepts without codable children: Secondary | ICD-10-CM

## 2010-09-30 LAB — GLUCOSE, CAPILLARY
Glucose-Capillary: 121 mg/dL — ABNORMAL HIGH (ref 70–99)
Glucose-Capillary: 156 mg/dL — ABNORMAL HIGH (ref 70–99)
Glucose-Capillary: 146 mg/dL — ABNORMAL HIGH (ref 70–99)
Glucose-Capillary: 150 mg/dL — ABNORMAL HIGH (ref 70–99)

## 2010-09-30 LAB — PROTIME-INR
INR: 2.93 — ABNORMAL HIGH (ref 0.00–1.49)
Prothrombin Time: 31 seconds — ABNORMAL HIGH (ref 11.6–15.2)

## 2010-09-30 NOTE — Op Note (Signed)
NAMEDNASIA, GAUNA NO.:  [id number]  MEDICAL RECORD NO.:  [id number]  LOCATION:                                 FACILITY:  PHYSICIAN:  [name], M.D.  DATE OF BIRTH:  10-Apr-1950  DATE OF PROCEDURE:  09/24/2010 DATE OF DISCHARGE:                              OPERATIVE REPORT   OPERATIONS: 1. Placement of left chest tube. 2. Video bronchoscopy.  SURGEON:  [name], MD  PREOPERATIVE DIAGNOSIS:  Large left pleural effusion with left lower lobe atelectasis status post mitral valve replacement 4 days previously.  POSTOPERATIVE DIAGNOSIS:  Large left pleural effusion with left lower lobe atelectasis status post mitral valve replacement 4 days previously.  INDICATION:  The patient is a 60 years old and had a mitral valve replacement for severe mitral stenosis on September 20, 2010.  Postop, she developed fairly quickly after extubation, a large amount of atelectasis of the left lower lobe with associated pleural effusion and she had borderline baseline pulmonary function and too heavy smoking history and her pulmonary hypertension from mitral stenosis.  Progressive pulmonary toilet and diuresis did not significantly improve the situation of her left lung.  It was felt that operative intervention with a chest tube placement and a video bronchoscopy to remove pleural fluid and to remove airway secretions was the best approach.  I discussed these issues with the patient including use of general anesthesia, understanding that this would probably result with her being back on the ventilator for a short period of time before being able to be weaned and extubated, but hopefully in better condition she is currently.  She agreed to proceed with surgery.  PROCEDURE IN DETAIL:  The patient was brought directly from the ICU to the OR.  General anesthesia was induced and she remained hemodynamically stable.  She was turned to expose the left chest.  This had  been previously marked as proper side.  A time-out was performed and the left chest was prepped and draped as a sterile field.  A small incision was made in the sixth interspace and pleural space was entered with a hemostat.  A large amount of bloody fluid returned and this was sent for culture.  A total of approximately 500 mL of bloody fluid was removed and a 28-French chest tube was positioned posteriorly to the apex of the left pleural space.  It was connected to a underwater seal Pleur-Evac collection system and secured to the skin with silk sutures and a sterile dressing was applied.  The patient was then returned to the supine position.  Next, through the endotracheal tube, a video bronchoscope was inserted. The distal trachea and carina showed chronic inflammation and some hyperemia.  The left mainstem bronchus had moderate thick secretions which were irrigated and removed.  The lobar segments and subsegmental lobar segments were all visualized and found to be patent after the suctioning and irrigation of the proximal airway.  There were no endobronchial lesions noted, but there was some mucosal inflammation noted.  Bronchoscope was then passed on the right mainstem bronchus. There were thinner secretions from the right upper and middle lobe and these were irrigated and suctioned.  The segmental orifices of the upper, middle, and lower lobes were then visualized and had no endobronchial lesions.  The bronchoscope was then removed using the endotracheal tube equipment, removers were performed to maximally expand both lungs and the procedure terminated and patient returned to the ICU in stable condition.     [name], M.D.     PV/MEDQ  D:  09/24/2010  T:  09/25/2010  Job:  [id number]  Electronically Signed by [name] M.D. on 09/30/2010 08:24:18 AM

## 2010-10-01 DIAGNOSIS — IMO0001 Reserved for inherently not codable concepts without codable children: Secondary | ICD-10-CM

## 2010-10-01 DIAGNOSIS — E1165 Type 2 diabetes mellitus with hyperglycemia: Secondary | ICD-10-CM

## 2010-10-01 LAB — CBC
HCT: 29.9 % — ABNORMAL LOW (ref 36.0–46.0)
Hemoglobin: 9.6 g/dL — ABNORMAL LOW (ref 12.0–15.0)
MCH: 28.3 pg (ref 26.0–34.0)
MCHC: 32.1 g/dL (ref 30.0–36.0)
MCV: 88.2 fL (ref 78.0–100.0)
Platelets: 298 10*3/uL (ref 150–400)
RBC: 3.39 MIL/uL — ABNORMAL LOW (ref 3.87–5.11)
RDW: 17 % — ABNORMAL HIGH (ref 11.5–15.5)
WBC: 9.6 10*3/uL (ref 4.0–10.5)

## 2010-10-01 LAB — BASIC METABOLIC PANEL
BUN: 25 mg/dL — ABNORMAL HIGH (ref 6–23)
CO2: 32 mEq/L (ref 19–32)
Calcium: 9.2 mg/dL (ref 8.4–10.5)
Chloride: 93 mEq/L — ABNORMAL LOW (ref 96–112)
Creatinine, Ser: 1.04 mg/dL (ref 0.50–1.10)
GFR calc Af Amer: >60 mL/min (ref >60)
GFR calc non Af Amer: 54 mL/min — ABNORMAL LOW (ref >60)
Glucose, Bld: 168 mg/dL — ABNORMAL HIGH (ref 70–99)
Potassium: 4.2 mEq/L (ref 3.5–5.1)
Sodium: 133 mEq/L — ABNORMAL LOW (ref 135–145)

## 2010-10-01 LAB — GLUCOSE, CAPILLARY
Glucose-Capillary: 256 mg/dL — ABNORMAL HIGH (ref 70–99)
Glucose-Capillary: 112 mg/dL — ABNORMAL HIGH (ref 70–99)
Glucose-Capillary: 136 mg/dL — ABNORMAL HIGH (ref 70–99)
Glucose-Capillary: 153 mg/dL — ABNORMAL HIGH (ref 70–99)

## 2010-10-01 LAB — PROTIME-INR
INR: 2.7 — ABNORMAL HIGH (ref 0.00–1.49)
Prothrombin Time: 29.1 seconds — ABNORMAL HIGH (ref 11.6–15.2)

## 2010-10-02 ENCOUNTER — Inpatient Hospital Stay (HOSPITAL_COMMUNITY): Payer: Medicare Other

## 2010-10-02 DIAGNOSIS — IMO0001 Reserved for inherently not codable concepts without codable children: Secondary | ICD-10-CM

## 2010-10-02 DIAGNOSIS — E1165 Type 2 diabetes mellitus with hyperglycemia: Secondary | ICD-10-CM

## 2010-10-02 LAB — GLUCOSE, CAPILLARY
Glucose-Capillary: 170 mg/dL — ABNORMAL HIGH (ref 70–99)
Glucose-Capillary: 189 mg/dL — ABNORMAL HIGH (ref 70–99)
Glucose-Capillary: 159 mg/dL — ABNORMAL HIGH (ref 70–99)
Glucose-Capillary: 218 mg/dL — ABNORMAL HIGH (ref 70–99)

## 2010-10-02 LAB — PROTIME-INR
INR: 2.28 — ABNORMAL HIGH (ref 0.00–1.49)
Prothrombin Time: 25.5 s — ABNORMAL HIGH (ref 11.6–15.2)

## 2010-10-03 DIAGNOSIS — IMO0001 Reserved for inherently not codable concepts without codable children: Secondary | ICD-10-CM

## 2010-10-03 DIAGNOSIS — E1165 Type 2 diabetes mellitus with hyperglycemia: Secondary | ICD-10-CM

## 2010-10-03 LAB — GLUCOSE, CAPILLARY
Glucose-Capillary: 103 mg/dL — ABNORMAL HIGH (ref 70–99)
Glucose-Capillary: 179 mg/dL — ABNORMAL HIGH (ref 70–99)

## 2010-10-03 LAB — PROTIME-INR
INR: 2.24 — ABNORMAL HIGH (ref 0.00–1.49)
Prothrombin Time: 25.2 seconds — ABNORMAL HIGH (ref 11.6–15.2)

## 2010-10-07 ENCOUNTER — Encounter: Payer: Medicare Other | Admitting: Internal Medicine

## 2010-10-09 NOTE — Discharge Summary (Signed)
[name], [name] NO.:  [id number]  MEDICAL RECORD NO.:  [id number]  LOCATION:  2016                         FACILITY:  [facility]  PHYSICIAN:  [name], M.D.  DATE OF BIRTH:  1950/06/08  DATE OF ADMISSION:  09/11/2010 DATE OF DISCHARGE:                              DISCHARGE SUMMARY   FINAL DIAGNOSES: 1. Severe mitral stenosis. 2. Severe pulmonary hypertension. 3. Class IV congestive heart failure.  IN-HOSPITAL DIAGNOSES: 1. Chronic obstructive pulmonary disease. 2. New-onset atrial fibrillation. 3. Acute on chronic anemia. 4. Delirium postoperatively. 5. Thrombophilia postoperatively. 6. Left pleural effusion with left lower lobe atelectasis     postoperatively. 7. Probable left lower lobe pneumonia. 8. Deconditioning postoperatively. 9. Bilateral 60% to 79% ICA stenosis.  SECONDARY DIAGNOSES: 1. History of chronic anemia. 2. History of fibromyalgia. 3. History of obstructive sleep apnea on CPAP. 4. History of bilateral carotid stenosis status post right carotid     endarterectomy by [name] on May 2012. 5. 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 6. Endometrial ablation in 2003. 7. Diabetes mellitus.  IN-HOSPITAL OPERATIONS AND PROCEDURES: 1. Cardiac catheterization done by Dr. Angelena Form on September 11, 2010. 2. Direct current cardioversion done by Dr. Aundra Dubin on September 18, 2010. 3. Intraoperative transesophageal echocardiogram done by Dr. Orene Desanctis     on September 20, 2010. 4. Mitral valve replacement with a 27-mm pericardial porcine valve,     left atrial maze procedure for proximal atrial fibrillation,     placement of femoral arterial line for blood pressure monitoring.     This was done by Dr. Prescott Gum on September 20, 2010. 5. Placement of left chest tube with video bronchoscopy by Dr. Prescott Gum on September 24, 2010.  HISTORY AND PHYSICAL AND HOSPITAL COURSE:  The patient is a  35-year female with no mitral stenosis for greater than 5 years, who has history of diabetes mellitus, depression, anxiety, hypertension, hyperlipidemia, gastroesophageal reflux disease, pulmonary hypertension, COPD, obstructive sleep apnea, peripheral vascular disease, and mitral stenosis.  By TEE, the patient's mitral stenosis appeared to be significant.  The patient has had increased dyspnea.  She was brought in by Dr. Angelena Form on September 11, 2010, for elective cardiac catheterization. For further details of the patient's past medical history and physical exam, please see dictated H and P.  The patient was brought to [facility] on September 11, 2010, where she underwent cardiac catheterization by Dr. Angelena Form.  This showed severe mitral stenosis with severely elevated pulmonary artery pressure as well as pulmonary capillary wedge pressure.  There was no evidence of coronary artery disease.  The patient was noted to be in atrial fibrillation with rapid ventricular response, which was new for the patient.  Following cardiac catheterization, it was felt that the patient required admission to [facility] due to her new-onset atrial fibrillation.  Plan was to consult Surgery while the patient was in-house.  The patient was admitted to [facility] on September 11, 2010.  The patient was started initially on Cardizem for atrial fibrillation.  She was also started on Lovenox injection and  digoxin was added during her hospitalization.  The patient remained in rate- controlled atrial fibrillation.  Ultimately, the digoxin was discontinued and she was started on IV amiodarone.  The patient was scheduled for TEE/DC CV on September 18, 2010.  This was done by Dr. Aundra Dubin where she was taken to the cath lab and underwent direct current cardioversion on September 18, 2010.  The patient did convert to normal sinus rhythm with first shock with no complications.  Following cardioversion, the  patient remained in normal sinus rhythm.  Her Cardizem was ultimately discontinued and she was started on p.o. amiodarone as well as Lopressor.  During this time, the patient was also being treated for her severe pulmonary hypertension.  She was receiving IV Lasix.  Critical Care was consulted for her severe COPD.  They maximized the pulmonary toilet.  Dr. Prescott Gum did see and evaluate the patient regarding possible mitral valve repair/replacement.  He felt at initial evaluation that the patient would need to stabilize from her pulmonary condition prior to proceeding with mitral valve replacement. A CT scan of the chest was done and obtained on September 16, 2010, and showed no acute pulmonary embolism noted.  Prior to the patient undergoing cardioversion, she was reevaluated by Dr. Prescott Gum on September 17, 2010, and noted that her overall symptoms were worse on maximum medical care.  Felt that the patient would require high-risk mitral valve replacement with maze procedure.  He discussed this with the patient and family.  They acknowledged understanding and agreed to proceed.  She was scheduled for surgery for September 20, 2010.  She did have preoperative carotid duplex ultrasound done showing bilateral 60% to 79% ICA stenosis.  The patient does have a history of anemia and preoperatively hemoglobin and hematocrit had dropped requiring several transfusions.  Preoperatively, her hemoglobin and hematocrit was 9.4 and 28.4.  On evaluation of September 19, 2010, the patient's dyspnea noted to be slightly improved.  O2 sats were improved with aggressive diuresis. She was felt ready to proceed for surgery for September 20, 2010.  The patient was taken to the operating room on September 20, 2010, where she underwent intraoperative transesophageal echocardiogram by Dr. Orene Desanctis.  She also had mitral valve replacement of 27-mm pericardial porcine valve with left atrial maze procedure for proximal  atrial fibrillation and placement of femoral arterial line for blood pressure monitoring by Dr. Prescott Gum on September 20, 2010.  The patient tolerated this procedure and was transferred to the surgical intensive care unit in stable condition.  Postoperatively, she was noted to be hemodynamically stable.  She remained off the ventilator for night.  She was able to be weaned and extubated postop day #1.  Post-extubation, the patient was noted to have mild delirium.  She was started on Ativan. This did clear up over the next 24-48 hours.  Postoperatively, the patient was noted to be in normal sinus rhythm.  She was continued on amiodarone drip as well as Levophed, milrinone, and dopamine.  She was ultimately switched over to p.o. amiodarone and these other drips were able to be weaned and discontinued.  The patient went back into atrial fibrillation postop day #2.  She had ventricular rate of 60.  She was placed on VPAC.  She was continued on amiodarone and Lopressor.  Blood pressure during this time has remained stable.  Coumadin was also added over the next several days due to persistent rate-controlled atrial fibrillation.  She required pacing due to heart rate in  11s and 60s. Prior to discharge home, rate stabilized in the 70s in atrial fibrillation.  Plan will be to discharge the patient to home on amiodarone and Coumadin.  Her Lopressor was ultimately discontinued due to the bradycardia.  Post-extubation day one, chest x-ray obtained, was stable with mild interstitial edema.  The patient was receiving diuretics.  Chest tubes noted to have minimal drainage and were discontinued in routine fashion. Follow up chest x-rays were obtained on September 22, 2010, and she was noted to have a moderately large left pleural effusion.  This was followed and it was felt by Dr. Prescott Gum to take her back to the operating room for drainage and placement of a left chest tube.  This was discussed with the  patient and family.  She was taken back to the operating room on September 24, 2010, where she underwent video bronchoscopy with placement of left chest tube by Dr. Prescott Gum.  The patient remained intubated overnight and was able to be extubated on September 25, 2010.  Post re-extubation, the patient's nerve was noted to be intact.  There was concern for possible left lower lobe pneumonia. The patient was started on appropriate antibiotics.  Chest tube drainage was monitored.  Follow up chest x-rays obtained.  Chest tube drainage decreased.  Follow up chest x-rays remained stable with improving aeration and improving atelectasis.  The patient's chest tube was able to be discontinued on September 27, 2010.  During this time, the patient was encouraged to use her incentive spirometer.  She had been continued on her nebulizers.  O2 sats have maintained greater than 90% on oxygen. She has been using her CPAP at night.  Currently, the patient's O2 sats are greater than 98% on 2 liters.  Plan will be for the patient to be discharged with oxygen continuously.  Following second re-extubation, Speech was consulted for evaluation.  A bedside swallow was obtained on September 26, 2010, and showed no signs or symptoms of dysphagia.  They recommended solid textures with thin liquids and home meds.  They felt the patient required no further followup.  Following swallow study, the patient was noted to be maintaining diet well.  Nutrition was consulted for assistance with providing adequate oral intake.  Felt that the patient was meeting her needs and do not require any supplements at that time.  During the patient's postoperative course, her hemoglobin and hematocrit followed closely.  She did develop further anemia requiring further transfusions.  Her most recent hemoglobin and hematocrit noted to be stable at 9.6 and 29.9%.  The patient also continued on diuretics. Daily weights were followed closely.  The  patient's volume overload was slowly starting to improve.  We will plan to continue the patient on daily Lasix at time of discharge.  Postoperatively, physical therapy was consulted for deconditioning.  They are working with the patient.  Felt that the patient would benefit from skilled nursing facility.  Social worker was consulted and currently working on placement.  During the patient's postoperative course, her blood sugars were followed closely. She is noted to be insulin-dependent diabetic.  Blood sugars noted to be stable and her insulin was adjusted as needed.  On October 01, 2010, the patient was noted to be afebrile, rate- controlled atrial fibrillation, blood pressure stable.  O2 sats greater than 90% on 2 liters.  Most recent lab work shows sodium of 133, potassium of 4.2, chloride of 93, bicarbonate 32, BUN of 25, creatinine 1.04, glucose 168.  INR  of 2.7.  White blood cell count 9.6, hemoglobin of 9.6, hematocrit 29.9, platelet count 298.  The patient is tentatively ready for discharge and transferred to skilled nursing facility in the a.m., October 02, 2010, pending all arrangements made.  FOLLOWUP APPOINTMENTS:  Follow up appointment is arranged with Dr. Prescott Gum for October 23, 2010, at 12 o'clock p.m.  The patient will need to obtain PA and lateral chest x-ray 45 minutes prior to this appointment.  The patient will need to follow up with Dr. Stanford Breed in 2 weeks.  She will need to contact his office to make these arrangements.  ACTIVITY:  The patient is instructed no driving until released to do so, no lifting over 10 pounds.  She is told to ambulate three times per day with assistance and progress as tolerated.  She is to continue using her incentive spirometer.  INCISIONAL CARE:  It is okay for the patient to shower, washing her incisions using soap and water.  She is to contact us if she develops any drainage or opening from any of her incision sites.  DIET:   The patient is on cardiac diet with low-fat, low-salt, as well as diabetic diet.  DISCHARGE MEDICATIONS: 1. Amiodarone 200 mg b.i.d. 2. Pulmicort 0.25 mg every 6 hours. 3. Vicodin 5/325 one to two tablets q.4 h p.r.n. pain. 4. Lasix 40 mg daily. 5. Xopenex 0.63 mg q.6 h. 6. Xopenex 1.25 mg q.2 h p.r.n. 7. Nicotine 21 mg for 24 hours patch change daily. 8. Potassium chloride 20 mEq daily. 9. Coumadin 1 mg daily. 10.Lantus insulin 30 units subcu daily. 11.Xanax 1 mg 1 tablet at bedtime, 1 tablet during the day for anxiety     p.r.n. 12.Enteric-coated aspirin 81 mg daily. 13.Crestor 20 mg daily. 14.Cymbalta 30 mg 3 capsules daily. 15.Colace 100 mg b.i.d. 16.Flaxseed 2 tablespoons daily. 17.Neurontin 300 mg b.i.d. 18.Loratadine 10 mg daily. 19.NovoLog sliding scale 3-7 units subcu t.i.d. before meals p.r.n. 20.Omeprazole 40 mg b.i.d. 21.Wellbutrin XL 150 mg daily.  Please draw the patient's PT/INR level every Monday and send results to [facility].     Iverson Alamin, PA   ______________________________ [name], M.D.    KMD/MEDQ  D:  10/01/2010  T:  10/01/2010  Job:  [id number]  Electronically Signed by Harrel Lemon PA on 10/02/2010 01:55:59 PM Electronically Signed by [name] M.D. on 10/09/2010 09:31:54 AM

## 2010-10-11 ENCOUNTER — Telehealth: Payer: Self-pay | Admitting: Cardiology

## 2010-10-11 NOTE — Telephone Encounter (Signed)
Spoke with Sharyn Lull at the facility  A foley cath has been placed and they have increased her Furosemide  She is better and has put off a lot of fluid  However the doctor at facility wanted to know if she could be seen prior to 10/21/10  I told Sharyn Lull that Dr Lonia Skinner schedule was booked and I would have to run this by his nurse  She is getting better from the increase of Furosemide but I told her I would pass this along to Dr Stanford Breed and his nurse for follow up

## 2010-10-11 NOTE — Telephone Encounter (Signed)
Per [name] on chest xray. With swelling. Please advise if patient need sooner appt.

## 2010-10-14 ENCOUNTER — Encounter: Payer: Self-pay | Admitting: Internal Medicine

## 2010-10-15 ENCOUNTER — Telehealth: Payer: Self-pay | Admitting: Cardiology

## 2010-10-15 NOTE — Telephone Encounter (Signed)
Spoke with pt friend, she is concerned because since the pt was discharged from the hosp she has had increased swelling in her feet and legs. The friend reports the pt is incoherent at times, her legs are swollen and hard to the touch. The pt has fallen twice and she is just concerned because the pt seems to going down hill instead of getting better. Called facility and spoke with stetson [phone]), he reports the pt being in atrial fib. They have scanned her legs and there is no DVT. They report the swelling in her legs goes down when they are elevated. Her 02 sats are running above 90's. The nurse reports she looks better today than  Friday. The NP is considering sending her back to the hosp if no improvement. She is on lasix 80 mg bid at present. She has a follow up appt on Monday 10-21-10. They will call back if no improvement or send pt to the [facility]

## 2010-10-15 NOTE — Telephone Encounter (Signed)
Pt's dtr calling re condition pt in right now

## 2010-10-21 ENCOUNTER — Other Ambulatory Visit: Payer: Self-pay | Admitting: Cardiology

## 2010-10-21 ENCOUNTER — Ambulatory Visit: Payer: Medicare Other | Admitting: Surgery

## 2010-10-21 ENCOUNTER — Encounter: Payer: Self-pay | Admitting: Cardiology

## 2010-10-21 ENCOUNTER — Ambulatory Visit (INDEPENDENT_AMBULATORY_CARE_PROVIDER_SITE_OTHER): Payer: Medicare Other | Admitting: *Deleted

## 2010-10-21 ENCOUNTER — Ambulatory Visit: Payer: Medicare Other | Admitting: Cardiology

## 2010-10-21 ENCOUNTER — Telehealth: Payer: Self-pay | Admitting: Cardiology

## 2010-10-21 ENCOUNTER — Ambulatory Visit (INDEPENDENT_AMBULATORY_CARE_PROVIDER_SITE_OTHER): Payer: Medicare Other | Admitting: Cardiology

## 2010-10-21 ENCOUNTER — Other Ambulatory Visit: Payer: Self-pay | Admitting: Cardiothoracic Surgery

## 2010-10-21 ENCOUNTER — Other Ambulatory Visit: Payer: Medicare Other

## 2010-10-21 DIAGNOSIS — I050 Rheumatic mitral stenosis: Secondary | ICD-10-CM

## 2010-10-21 DIAGNOSIS — I679 Cerebrovascular disease, unspecified: Secondary | ICD-10-CM

## 2010-10-21 DIAGNOSIS — I5032 Chronic diastolic (congestive) heart failure: Secondary | ICD-10-CM | POA: Insufficient documentation

## 2010-10-21 DIAGNOSIS — F17200 Nicotine dependence, unspecified, uncomplicated: Secondary | ICD-10-CM

## 2010-10-21 DIAGNOSIS — I509 Heart failure, unspecified: Secondary | ICD-10-CM | POA: Insufficient documentation

## 2010-10-21 DIAGNOSIS — I5033 Acute on chronic diastolic (congestive) heart failure: Secondary | ICD-10-CM

## 2010-10-21 DIAGNOSIS — E785 Hyperlipidemia, unspecified: Secondary | ICD-10-CM

## 2010-10-21 DIAGNOSIS — I4891 Unspecified atrial fibrillation: Secondary | ICD-10-CM

## 2010-10-21 DIAGNOSIS — Z720 Tobacco use: Secondary | ICD-10-CM

## 2010-10-21 HISTORY — DX: Chronic diastolic (congestive) heart failure: I50.32

## 2010-10-21 LAB — POCT INR: INR: 2.3

## 2010-10-21 MED ORDER — AMIODARONE HCL 200 MG PO TABS: 200.0000 mg | ORAL_TABLET | Freq: Every day | ORAL | Status: DC

## 2010-10-21 NOTE — Assessment & Plan Note (Signed)
Continue amiodarone but decrease dose to 200 mg daily. I think her atrial fibrillation is contributing significantly to her volume excess. Continue Coumadin. Once she has been therapeutic for 3 consecutive weeks proceed with cardioversion.

## 2010-10-21 NOTE — Assessment & Plan Note (Signed)
The patient has discontinued.

## 2010-10-21 NOTE — Assessment & Plan Note (Signed)
She will need followup with vascular surgery. Resume Crestor 20 mg daily.

## 2010-10-21 NOTE — Assessment & Plan Note (Signed)
Continue present dose of Lasix. Check potassium and renal function.

## 2010-10-21 NOTE — Telephone Encounter (Signed)
Spoke with pt, she is leaving the nursing facility in a few days and will give me a call when she gets home to make sure she has the correct meds [name]

## 2010-10-21 NOTE — Assessment & Plan Note (Signed)
>>  ASSESSMENT AND PLAN FOR CHRONIC CHF (CONGESTIVE HEART FAILURE) (HCC) WRITTEN ON 10/21/2010  1:26 PM BY CRENSHAW, Madolyn Frieze, MD  Continue present dose of Lasix. Check potassium and renal function.

## 2010-10-21 NOTE — Patient Instructions (Signed)
Appointment with Coumadin clinic every week.  See Dr.Crenshaw in 4 weeks  Bmet today LAB

## 2010-10-21 NOTE — Telephone Encounter (Signed)
Pt calling wanting to make sure the  medications pt was put on today are called into pharmacy. Please call into [facility]. Please return pt call to discuss further.

## 2010-10-21 NOTE — Progress Notes (Signed)
HYI:FOYDXAJO female I saw in April of 2012 for evaluation of chest pain. Carotid Dopplers in May 2012 showed an 80-99% right and a 60-79% left stenosis. Patient had right carotid endarterectomy and is now followed by vascular surgery. Patient had recent workup for mitral stenosis. Cardiac catheterization in August of 2012 showed normal LV function, normal coronary arteries, severe mitral stenosis, severely elevated pulmonary capillary wedge pressure and severe pulmonary hypertension. The patient was admitted following the procedure because of atrial fibrillation. She deteriorated and ultimately had TEE cardioversion. The patient had mitral valve replacement with a 27-mm pericardial porcine valve and left atrial maze procedure for paroxysmal atrial fibrillation on September 21, 2010. Patient did require chest tube placement for pleural effusion postoperatively. She also developed atrial fibrillation postoperatively and has been treated with amiodarone. Since discharge, she does have dyspnea on exertion but denies orthopnea or PND. She has pedal edema which is improving. She denies chest pain although her chest is sore from her recent surgery. No palpitations or syncope.   Current Outpatient Prescriptions  Medication Sig Dispense Refill  . furosemide (LASIX) 80 MG tablet Take 80 mg by mouth daily.        Marland Kitchen levalbuterol (XOPENEX HFA) 45 MCG/ACT inhaler Inhale 1-2 puffs into the lungs as directed.        . potassium chloride SA (K-DUR,KLOR-CON) 20 MEQ tablet Take 20 mEq by mouth as directed. 2 po daily       . Warfarin Sodium (COUMADIN PO) Take by mouth daily.        Marland Kitchen albuterol (PROVENTIL,VENTOLIN) 90 MCG/ACT inhaler Inhale 1-2 puffs into the lungs every 4 (four) hours as needed.        . ALPRAZolam (XANAX) 1 MG tablet Take 1 tablet (1 mg total) by mouth 3 (three) times daily as needed for sleep or anxiety.  90 tablet  1  . aspirin 81 MG tablet Take 81 mg by mouth daily.        Marland Kitchen buPROPion (WELLBUTRIN XL) 150  MG 24 hr tablet Take 1 tablet (150 mg total) by mouth daily.  30 tablet  0  . docusate sodium (COLACE) 100 MG capsule Take 1 capsule (100 mg total) by mouth 2 (two) times daily.  60 capsule  7  . DULoxetine (CYMBALTA) 30 MG capsule Take 1 capsule (30 mg total) by mouth daily. Take 3 tablets by mouth once daily  90 capsule  7  . Fluticasone-Salmeterol (ADVAIR DISKUS) 250-50 MCG/DOSE AEPB Inhale 1 puff into the lungs every 12 (twelve) hours.        . gabapentin (NEURONTIN) 300 MG capsule Take 1 capsule (300 mg total) by mouth 2 (two) times daily.  60 capsule  2  . glucose blood test strip 1 each by Other route as needed. Use as instructed       . HYDROcodone-acetaminophen (NORCO) 10-325 MG per tablet Take 1 tablet 4 times a day as needed for pain  120 tablet  5  . insulin aspart (NOVOLOG) 100 UNIT/ML injection 3 units to cover 3 carb choices each meal 3x a day. Add correction amount of Novolog to food Novolog if needed up to a total of 30 units per day: If blood sugar is: < 150= no correction, if 151-200 add 1 unit, if 201-250 add 2 units, if 251-300 add 3 units, if 301-350 add 4 units, if 351-400 add 5 units, if 401- 450 add 6 units, if > 450 please call office for instructions.  10 mL  11  .  insulin glargine (LANTUS) 100 UNIT/ML injection Inject 55 Units into the skin at bedtime.  10 mL  11  . Insulin Syringe-Needle U-100 (INSULIN SYRINGE .3CC/31GX5/16") 31G X 5/16" 0.3 ML MISC 1 Syringe by Does not apply route 2 (two) times daily at 10 AM and 5 PM.  100 each  11  . Insulin Syringe-Needle U-100 (INSULIN SYRINGE 1CC/30GX1/2") 30G X 1/2" 1 ML MISC 1 Container by Does not apply route 2 (two) times daily.  100 each  11  . lisinopril-hydrochlorothiazide (PRINZIDE,ZESTORETIC) 10-12.5 MG per tablet TAKE 1 TABLET BY MOUTH DAILY  30 tablet  PRN  . loratadine (CLARITIN) 10 MG tablet Take 1 tablet (10 mg total) by mouth daily.  30 tablet  7  . omeprazole (PRILOSEC) 40 MG capsule Take 40 mg by mouth daily.          . rosuvastatin (CRESTOR) 20 MG tablet Take 1 tablet (20 mg total) by mouth daily.  30 tablet  7  . tiotropium (SPIRIVA) 18 MCG inhalation capsule Place 1 capsule (18 mcg total) into inhaler and inhale daily.  30 capsule  7     Past Medical History  Diagnosis Date  . Depression   . GERD (gastroesophageal reflux disease)     Followed by [name] // small bowel capsule endoscopy (11/2009) - minimal chronic inflammation, otherwise negative for  H.pylori, metaplasia, dysplasia  . Hypertension   . Hyperlipidemia   . Diabetes mellitus type II, uncontrolled     on insulin therapy  . COPD (chronic obstructive pulmonary disease) 2008    not on home O2 // her CT of the chest there was no evidence of primary carcinoid tumor , scattered normal-sized mediastinal and hilar as well as axillary lymph nodes noted., no significant lymphadenopathy. COPD and emphysema with no acute cardiopulmonary disease noted , mild cardiomegaly and solitary calcified  plaque  . Moderate to severe pulmonary hypertension     severe per TEE (08/2010) - Peak RV-RA gradient 102m Hg  . Mitral regurgitation     mild to moderate per TEE (08/2010)  . Shingles   . Anemia     Anemia panel (08/2009) - iron 21, TIBC 441, UIBC 420,  ferritin 11 // Pt has been unable to tolerate iron supplementation due to chronic abdominal discomfort, source of which still is not clear.  .Marland KitchenHistory of colonic polyps     Colonoscopy (10/2005) - colon polyp with path nonadenomatous, no malignancy - by Dr. [name] // Colonoscopy (02/2002) - 2 polyps noted, 1 - adenomatous, 2 - hyperplastic // Followed by Dr. [name] . Anxiety   . IBS (irritable bowel syndrome)   . Osteoporosis     DEXA (12/2004) - L-spine T score -2.6, Left hip -0.1  . Fibromyalgia   . Obstructive sleep apnea     on CPAP. // Sleep study (06/2009) - Moderate sleep apnea/ hypopnea syndrome , AHI 17.8 per hour with nonpositional hypopneas. CPAP titration to 12 CWP, AHI 2.4 per hour. Small  resMed Quattro full-face mask with heated humidifier  . Restless leg syndrome   . Mitral stenosis     moderate per TEE (08/2010) // [name] . Bilateral carotid artery stenosis     s/p right endarterectomy (06/2010). // Carotid [place](07/2010) -  60%-79% ICA stenosis, mid range of scale by velocity.  Left: Moderate-to-severe calcific and non-calcific plaque origin andproximal ICA and ECA.  60%-79% ICA stenosis, highest end of scale // Followed by [name]  Past Surgical History  Procedure Date  . Orif clavicle fracture 01/2004    by Thana Farr. [name], M.D for Right clavicle nonunion.  . Tonsillectomy   . Carotid endarterectomy 07/04/2010    right, by [name] for asymptomatic right carotid artery stenosis  . Lipoma exision 08/2005    occipital lipoma 1.5cm - by Dr. [name]  . Hysteroscopy with endometrial ablation 06/2001    for persistent post-menopausal bleeding // by S. [name], M.D.  . Mitral valve replacement   . Maze     History   Social History  . Marital Status: Divorced    Spouse Name: N/A    Number of Children: N/A  . Years of Education: N/A   Occupational History  . Not on file.   Social History Main Topics  . Smoking status: Former Smoker -- 1.0 packs/day for 50 years    Types: Cigarettes  . Smokeless tobacco: Not on file  . Alcohol Use: No  . Drug Use: No  . Sexually Active: No   Other Topics Concern  . Not on file   Social History Narrative  . No narrative on file    ROS: no fevers or chills, productive cough, hemoptysis, dysphasia, odynophagia, melena, hematochezia, dysuria, hematuria, rash, seizure activity, orthopnea, PND,  claudication. Remaining systems are negative.  Physical Exam: Well-developed well-nourished in no acute distress.  Skin is warm and dry.  HEENT is normal.  Neck is supple. No thyromegaly.  Chest is diminished breath sounds throughout; sternotomy without evidence of  infection Cardiovascular exam is irregular Abdominal exam nontender or distended. No masses palpated. Extremities show 1+ edema. neuro grossly intact  ECG atrial fibrillation at a rate of 86. Nonspecific ST changes.

## 2010-10-21 NOTE — Assessment & Plan Note (Signed)
Resume Crestor 20 mg daily.

## 2010-10-21 NOTE — Assessment & Plan Note (Signed)
Patient is status post mitral valve replacement. Continued SBE prophylaxis. We'll arrange baseline echocardiogram post aortic valve replacement once we establish sinus rhythm.

## 2010-10-22 ENCOUNTER — Encounter: Payer: Medicare Other | Admitting: *Deleted

## 2010-10-22 DIAGNOSIS — I4891 Unspecified atrial fibrillation: Secondary | ICD-10-CM

## 2010-10-22 DIAGNOSIS — J90 Pleural effusion, not elsewhere classified: Secondary | ICD-10-CM

## 2010-10-22 DIAGNOSIS — I480 Paroxysmal atrial fibrillation: Secondary | ICD-10-CM | POA: Insufficient documentation

## 2010-10-22 DIAGNOSIS — I050 Rheumatic mitral stenosis: Secondary | ICD-10-CM

## 2010-10-22 HISTORY — DX: Paroxysmal atrial fibrillation: I48.0

## 2010-10-22 LAB — BASIC METABOLIC PANEL
BUN: 19 mg/dL (ref 6–23)
CO2: 36 mEq/L — ABNORMAL HIGH (ref 19–32)
Calcium: 9.2 mg/dL (ref 8.4–10.5)
Chloride: 98 mEq/L (ref 96–112)
Creatinine, Ser: 1.2 mg/dL (ref 0.4–1.2)
GFR: 46.66 mL/min — ABNORMAL LOW (ref >60.00)
Glucose, Bld: 106 mg/dL — ABNORMAL HIGH (ref 70–99)
Potassium: 4.3 mEq/L (ref 3.5–5.1)
Sodium: 143 mEq/L (ref 135–145)

## 2010-10-23 ENCOUNTER — Ambulatory Visit (INDEPENDENT_AMBULATORY_CARE_PROVIDER_SITE_OTHER): Payer: Self-pay | Admitting: Cardiothoracic Surgery

## 2010-10-23 ENCOUNTER — Ambulatory Visit
Admission: RE | Admit: 2010-10-23 | Discharge: 2010-10-23 | Disposition: A | Discharge status: Home / Self Care | Payer: Medicare Other | Source: Ambulatory Visit | Attending: Cardiothoracic Surgery | Admitting: Cardiothoracic Surgery

## 2010-10-23 ENCOUNTER — Encounter: Payer: Self-pay | Admitting: Cardiothoracic Surgery

## 2010-10-23 VITALS — BP 119/71 | HR 67 | Temp 97.1°F | Resp 20 | Ht 62.0 in | Wt 180.0 lb

## 2010-10-23 DIAGNOSIS — I050 Rheumatic mitral stenosis: Secondary | ICD-10-CM

## 2010-10-23 DIAGNOSIS — J90 Pleural effusion, not elsewhere classified: Secondary | ICD-10-CM

## 2010-10-23 DIAGNOSIS — Z954 Presence of other heart-valve replacement: Secondary | ICD-10-CM

## 2010-10-23 DIAGNOSIS — Z952 Presence of prosthetic heart valve: Secondary | ICD-10-CM

## 2010-10-23 NOTE — Progress Notes (Signed)
HPI the patient is a 60 year old Caucasian smoker who returns for her first postop visit following mitral valve replacement with a tissue valve prostheis( bovine pericardium). Her postoperative problems included left lobe atelectasis with small pleural effusion, atrial  Fibrillation and peripheral edema. She has COPD from smoking and is on 2 L nasal cannula oxygen therapy. He is currently at the [facility] skilled nursing facility and has received physical therapy. She denies any bleeding complications from her Coumadin therapy. Getting stronger and is walking daily. The surgical incision is healing well and she has minimal musculoskeletal pain. She has been seen in followup by her cardiologist Dr. Stanford Breed. A planned elective cardioversion is scheduled following appropriate length of anticoagulation.   Current Outpatient Prescriptions  Medication Sig Dispense Refill  . albuterol (PROVENTIL,VENTOLIN) 90 MCG/ACT inhaler Inhale 1-2 puffs into the lungs every 4 (four) hours as needed.       . ALPRAZolam (XANAX) 1 MG tablet Take 1 tablet (1 mg total) by mouth 3 (three) times daily as needed for sleep or anxiety.  90 tablet  1  . amiodarone (PACERONE) 200 MG tablet Take 1 tablet (200 mg total) by mouth daily.      Marland Kitchen aspirin 81 MG tablet Take 81 mg by mouth daily.        Marland Kitchen buPROPion (WELLBUTRIN XL) 150 MG 24 hr tablet Take 1 tablet (150 mg total) by mouth daily.  30 tablet  0  . docusate sodium (COLACE) 100 MG capsule Take 1 capsule (100 mg total) by mouth 2 (two) times daily.  60 capsule  7  . DULoxetine (CYMBALTA) 30 MG capsule Take 1 capsule (30 mg total) by mouth daily. Take 3 tablets by mouth once daily  90 capsule  7  . Fluticasone-Salmeterol (ADVAIR DISKUS) 250-50 MCG/DOSE AEPB Inhale 1 puff into the lungs every 12 (twelve) hours.        . furosemide (LASIX) 80 MG tablet Take 80 mg by mouth daily.       Marland Kitchen gabapentin (NEURONTIN) 300 MG capsule Take 1 capsule (300 mg total) by mouth 2 (two) times  daily.  60 capsule  2  . glucose blood test strip 1 each by Other route as needed. Use as instructed       . HYDROcodone-acetaminophen (NORCO) 10-325 MG per tablet Take 1 tablet 4 times a day as needed for pain  120 tablet  5  . insulin aspart (NOVOLOG) 100 UNIT/ML injection 3 units to cover 3 carb choices each meal 3x a day. Add correction amount of Novolog to food Novolog if needed up to a total of 30 units per day: If blood sugar is: < 150= no correction, if 151-200 add 1 unit, if 201-250 add 2 units, if 251-300 add 3 units, if 301-350 add 4 units, if 351-400 add 5 units, if 401- 450 add 6 units, if > 450 please call office for instructions.  10 mL  11  . insulin glargine (LANTUS) 100 UNIT/ML injection Inject 30 Units into the skin at bedtime.        . Insulin Syringe-Needle U-100 (INSULIN SYRINGE .3CC/31GX5/16") 31G X 5/16" 0.3 ML MISC 1 Syringe by Does not apply route 2 (two) times daily at 10 AM and 5 PM.  100 each  11  . Insulin Syringe-Needle U-100 (INSULIN SYRINGE 1CC/30GX1/2") 30G X 1/2" 1 ML MISC 1 Container by Does not apply route 2 (two) times daily.  100 each  11  . levalbuterol (XOPENEX HFA) 45 MCG/ACT inhaler Inhale  1-2 puffs into the lungs as directed.       Marland Kitchen lisinopril-hydrochlorothiazide (PRINZIDE,ZESTORETIC) 10-12.5 MG per tablet TAKE 1 TABLET BY MOUTH DAILY  30 tablet  PRN  . loratadine (CLARITIN) 10 MG tablet Take 1 tablet (10 mg total) by mouth daily.  30 tablet  7  . nicotine (NICODERM CQ - DOSED IN MG/24 HOURS) 21 mg/24hr patch Place 1 patch onto the skin daily.        Marland Kitchen omeprazole (PRILOSEC) 40 MG capsule Take 40 mg by mouth 2 (two) times daily.       . potassium chloride SA (K-DUR,KLOR-CON) 20 MEQ tablet Take 20 mEq by mouth as directed. 2 po daily       . rosuvastatin (CRESTOR) 20 MG tablet Take 1 tablet (20 mg total) by mouth daily.  30 tablet  7  . Warfarin Sodium (COUMADIN PO) Take by mouth daily.        Marland Kitchen tiotropium (SPIRIVA) 18 MCG inhalation capsule Place 1 capsule  (18 mcg total) into inhaler and inhale daily.  30 capsule  7     Review of Systems: No fever or weight loss. Overall strength gradually improved. No surgical incision problems.   Physical Exam A pressure 120/70 pulse 80 and irregular chin saturation on 2 L 99% weight 180 pounds. Gen. appearance is a middle-aged chronically ill female in wheelchair. She has a Foley catheter in place. She is alert and oriented. Chest is with well-healed sternal incision. Diminished breath sounds at the left base. Cardiac exam is irregular rhythm, no murmur or rub.  Extremities reveal mild edema and there is some generalized erythema. Neuro exam is intact. She is able to walk 250 feet pushing her wheelchair.  Diagnostic Tests:   chest x-ray today shows able cardiac silhouette with atelectasis of the left base, small left pleural effusion.  Impression: Table course one month following mitral valve replacement for severe mitral stenosis and class IV CHF. Assistant postoperative atrial fibrillation, persistent left basilar atelectasis and small effusion.   Plan: The patient will be able to return home with home therapy. Her Coumadin will be supervised by Dr. Stanford Breed. I'll see her back in 2-3 weeks with a followup chest x-ray to evaluate the persistent left pleural effusion. She was advised to stop smoking.

## 2010-10-23 NOTE — Patient Instructions (Signed)
You are ready to return home from the [facility]. Do not smoke. Conitnue to use the incentive spirometer. Take a 15-20 min walk daily.

## 2010-10-25 ENCOUNTER — Telehealth: Payer: Self-pay | Admitting: Cardiology

## 2010-10-25 DIAGNOSIS — I4891 Unspecified atrial fibrillation: Secondary | ICD-10-CM

## 2010-10-25 MED ORDER — FUROSEMIDE 80 MG PO TABS: 80.0000 mg | ORAL_TABLET | Freq: Every day | ORAL | Status: DC

## 2010-10-25 MED ORDER — POTASSIUM CHLORIDE CRYS ER 20 MEQ PO TBCR: EXTENDED_RELEASE_TABLET | ORAL | Status: DC

## 2010-10-25 MED ORDER — AMIODARONE HCL 200 MG PO TABS: 200.0000 mg | ORAL_TABLET | Freq: Every day | ORAL | Status: DC

## 2010-10-25 NOTE — Telephone Encounter (Signed)
Requesting refill of amiodarone 200 mg, klor-con , lasix 80 mg, [facility]

## 2010-10-26 NOTE — Cardiovascular Report (Signed)
  NAMEJULIETTA, [name] NO.:  [id number]  MEDICAL RECORD NO.:  [id number]  LOCATION:                                 FACILITY:  PHYSICIAN:  Loralie Champagne, MD      DATE OF BIRTH:  November 02, 1950  DATE OF PROCEDURE: DATE OF DISCHARGE:                           CARDIAC CATHETERIZATION   PROCEDURE:  Direct current cardioversion.  INDICATION:  This is a 60 year old with moderate-to-severe mitral stenosis, pulmonary hypertension, and congestive heart failure who went into atrial fibrillation.  She has been on therapeutic Lovenox for the last several days.  The patient gave informed consent.  She then initially underwent transesophageal echocardiogram which showed no left atrial appendage thrombus.  She then was sedated by Anesthesiology, using IV propofol.  Direct current cardioversion was then carried out 200 joules using a biphasic waveform.  She converted to normal sinus rhythm with a first shock and no complications.     Loralie Champagne, MD     DM/MEDQ  D:  09/18/2010  T:  09/18/2010  Job:  [id number]  Electronically Signed by Loralie Champagne MD on 10/26/2010 11:14:43 PM

## 2010-10-28 ENCOUNTER — Telehealth: Payer: Self-pay | Admitting: Cardiology

## 2010-10-28 ENCOUNTER — Other Ambulatory Visit: Payer: Self-pay | Admitting: *Deleted

## 2010-10-28 ENCOUNTER — Ambulatory Visit (INDEPENDENT_AMBULATORY_CARE_PROVIDER_SITE_OTHER): Payer: Medicare Other | Admitting: *Deleted

## 2010-10-28 DIAGNOSIS — I4891 Unspecified atrial fibrillation: Secondary | ICD-10-CM

## 2010-10-28 DIAGNOSIS — Z79899 Other long term (current) drug therapy: Secondary | ICD-10-CM

## 2010-10-28 DIAGNOSIS — R0602 Shortness of breath: Secondary | ICD-10-CM

## 2010-10-28 DIAGNOSIS — J90 Pleural effusion, not elsewhere classified: Secondary | ICD-10-CM

## 2010-10-28 LAB — POCT INR: INR: 2.3

## 2010-10-28 MED ORDER — AMIODARONE HCL 200 MG PO TABS: 200.0000 mg | ORAL_TABLET | Freq: Every day | ORAL | Status: DC

## 2010-10-28 MED ORDER — POTASSIUM CHLORIDE CRYS ER 20 MEQ PO TBCR: 20.0000 meq | EXTENDED_RELEASE_TABLET | Freq: Two times a day (BID) | ORAL | Status: DC

## 2010-10-28 MED ORDER — FUROSEMIDE 80 MG PO TABS: 80.0000 mg | ORAL_TABLET | Freq: Two times a day (BID) | ORAL | Status: DC

## 2010-10-28 NOTE — Telephone Encounter (Signed)
Pt called and she needs you to call her back about some of her medications.

## 2010-10-28 NOTE — Telephone Encounter (Signed)
Spoke with pt, she was recently here for cvrr visit and had some questions regarding the med list she was given. Questions answered and changes made to med list. Pt will have a bmp checked with next coumadin appt to address questions regarding potassium and furosemide. Fredia Beets

## 2010-11-01 LAB — CROSSMATCH
ABO/RH(D): A NEG
Antibody Screen: NEGATIVE

## 2010-11-01 LAB — HAPTOGLOBIN: Haptoglobin: 231 — ABNORMAL HIGH

## 2010-11-01 LAB — CBC
HCT: 25.5 — ABNORMAL LOW
HCT: 26.7 — ABNORMAL LOW
Hemoglobin: 8.1 — ABNORMAL LOW
Hemoglobin: 8.4 — ABNORMAL LOW
MCHC: 31.5
MCHC: 31.6
MCV: 69.5 — ABNORMAL LOW
MCV: 69.9 — ABNORMAL LOW
Platelets: 294
Platelets: 324
RBC: 3.66 — ABNORMAL LOW
RBC: 3.84 — ABNORMAL LOW
RDW: 20.3 — ABNORMAL HIGH
RDW: 20.6 — ABNORMAL HIGH
WBC: 8.3
WBC: 9.4

## 2010-11-01 LAB — FERRITIN: Ferritin: 6 — ABNORMAL LOW (ref 10–291)

## 2010-11-01 LAB — SAVE SMEAR

## 2010-11-01 LAB — FOLATE RBC: RBC Folate: 1551 — ABNORMAL HIGH

## 2010-11-01 LAB — APTT: aPTT: 26

## 2010-11-01 LAB — TISSUE TRANSGLUTAMINASE, IGG: Tissue Transglut Ab: 0 U/mL (ref <7)

## 2010-11-01 LAB — RETICULOCYTES
RBC.: 3.64 — ABNORMAL LOW
Retic Count, Absolute: 98.3
Retic Ct Pct: 2.7

## 2010-11-01 LAB — ABO/RH: ABO/RH(D): A NEG

## 2010-11-01 LAB — PROTIME-INR
INR: 1
Prothrombin Time: 13.6

## 2010-11-01 LAB — TISSUE TRANSGLUTAMINASE, IGA: Tissue Transglutaminase Ab, IgA: 0.3 U/mL (ref <7)

## 2010-11-01 LAB — LACTATE DEHYDROGENASE: LDH: 117

## 2010-11-01 LAB — ENDOMYSIAL IGA ANTIBODY: Endomysial IgA Autoabs: <1:10 {titer}

## 2010-11-01 LAB — VITAMIN B12: Vitamin B-12: 368 (ref 211–911)

## 2010-11-01 NOTE — Discharge Summary (Signed)
  [name], MAGAW NO.:  [id number]  MEDICAL RECORD NO.:  [id number]  LOCATION:                                 FACILITY:  PHYSICIAN:  [name], M.D.  DATE OF BIRTH:  [birth year]  DATE OF ADMISSION: DATE OF DISCHARGE:                              DISCHARGE SUMMARY   ADDENDUM:  The patient was anticipated for discharge to skilled nursing facility on October 02, 2010.  Discharge was held at this time.  Felt that the patient needed another 24 hours of monitoring heart rate.  She was noted to be in rate-controlled atrial fibrillation.  External pacing wires were able to be discontinued on October 02, 2010.  INR at that time of 2.28.  Blood pressures noted to be well controlled.  The patient remained rate-controlled atrial fibrillation with rate in the 60s. During this time, she has remained afebrile.  Blood pressure stable. She was able to be weaned off oxygen with O2 saturations maintaining greater than 90% on room air.  Blood sugars were continued to be followed and remained stable.  She continued to ambulate well with assistance.  She has still continued to receive diuretics for volume overload and this was improving.  She did have complaints of bilateral knee swelling on October 02, 2010 but this was resolved by October 03, 2010.  The patient was tolerating diet well.  All incisions are clean, dry, and intact and healing well.  The patient was felt to be stable and ready for transfer to skilled nursing facility on October 03, 2010.  Please see dictated discharge summary for followup appointments and discharge instructions.  DISCHARGE MEDICATIONS: 1. Amiodarone 200 mg b.i.d. 2. Pulmicort 0.25 mg every 6 hours. 3. Vicodin 5/325 1-2 tablets q.4 h. p.r.n. pain. 4. Lasix 40 mg daily. 5. Xopenex 0.63 mg every 6 hours. 6. Xopenex 1.25 mg every 2 hours p.r.n. 7. Nicotine patch 21 mg per 24 hours change daily. 8. Potassium chloride 20 mEq daily. 9. Coumadin  1 mg daily. 10.Lantus insulin 30 units subcu daily. 11.Xanax 1 mg 1 tablet at night and 1 tablet during the day for     anxiety as needed. 12.Aspirin enteric-coated 81 mg daily. 13.Crestor 20 mg daily. 14.Cymbalta 30 mg 3 capsules daily. 15.Colace 100 mg b.i.d. 16.Flaxseed 2 tablespoons daily. 17.Neurontin 300 mg b.i.d. 18.Loratadine 10 mg daily. 19.NovoLog sliding scale 3-7 units subcu 3 times daily before meals as     needed. 20.Omeprazole 40 mg b.i.d. 21.Wellbutrin XL 150 mg daily.     Iverson Alamin, PA   ______________________________ [name], M.D.    KMD/MEDQ  D:  10/22/2010  T:  10/23/2010  Job:  [id number]  cc:   Denice Bors. Stanford Breed, MD, [facility]  Electronically Signed by Harrel Lemon PA on 10/25/2010 10:27:01 AM Electronically Signed by [name] M.D. on 11/01/2010 01:23:20 PM

## 2010-11-04 ENCOUNTER — Other Ambulatory Visit (INDEPENDENT_AMBULATORY_CARE_PROVIDER_SITE_OTHER): Payer: Medicare Other | Admitting: *Deleted

## 2010-11-04 ENCOUNTER — Ambulatory Visit (INDEPENDENT_AMBULATORY_CARE_PROVIDER_SITE_OTHER): Payer: Medicare Other | Admitting: *Deleted

## 2010-11-04 ENCOUNTER — Ambulatory Visit (INDEPENDENT_AMBULATORY_CARE_PROVIDER_SITE_OTHER): Payer: Medicare Other | Admitting: Internal Medicine

## 2010-11-04 ENCOUNTER — Encounter: Payer: Self-pay | Admitting: Internal Medicine

## 2010-11-04 VITALS — BP 118/61 | HR 85 | Temp 96.5°F | Ht 62.0 in | Wt 168.5 lb

## 2010-11-04 DIAGNOSIS — J90 Pleural effusion, not elsewhere classified: Secondary | ICD-10-CM

## 2010-11-04 DIAGNOSIS — I4891 Unspecified atrial fibrillation: Secondary | ICD-10-CM

## 2010-11-04 DIAGNOSIS — I10 Essential (primary) hypertension: Secondary | ICD-10-CM

## 2010-11-04 DIAGNOSIS — Z0000 Encounter for general adult medical examination without abnormal findings: Secondary | ICD-10-CM

## 2010-11-04 DIAGNOSIS — B49 Unspecified mycosis: Secondary | ICD-10-CM

## 2010-11-04 DIAGNOSIS — Z79899 Other long term (current) drug therapy: Secondary | ICD-10-CM

## 2010-11-04 DIAGNOSIS — Z23 Encounter for immunization: Secondary | ICD-10-CM

## 2010-11-04 DIAGNOSIS — E785 Hyperlipidemia, unspecified: Secondary | ICD-10-CM

## 2010-11-04 DIAGNOSIS — E119 Type 2 diabetes mellitus without complications: Secondary | ICD-10-CM

## 2010-11-04 LAB — BASIC METABOLIC PANEL
BUN: 20 mg/dL (ref 6–23)
CO2: 30 mEq/L (ref 19–32)
Calcium: 8.8 mg/dL (ref 8.4–10.5)
Chloride: 96 mEq/L (ref 96–112)
Creatinine, Ser: 0.8 mg/dL (ref 0.4–1.2)
GFR: 75.19 mL/min (ref >60.00)
Glucose, Bld: 165 mg/dL — ABNORMAL HIGH (ref 70–99)
Potassium: 3.3 mEq/L — ABNORMAL LOW (ref 3.5–5.1)
Sodium: 138 mEq/L (ref 135–145)

## 2010-11-04 LAB — GLUCOSE, CAPILLARY: Glucose-Capillary: 144 mg/dL — ABNORMAL HIGH (ref 70–99)

## 2010-11-04 LAB — POCT INR: INR: 2.6

## 2010-11-04 MED ORDER — WARFARIN SODIUM 2 MG PO TABS: ORAL_TABLET | ORAL | Status: DC

## 2010-11-04 NOTE — Assessment & Plan Note (Signed)
Patient reports home CBG's = 150's, with Lantus 30.  Patient reports not needing to use prandial insulin due to such well-controlled readings.  Last Hb A1C = 7.2 (09/2010), will re-check at next visit. -continue Lantus 30 units qhs -continue sliding scale insulin if CBG's > 200

## 2010-11-04 NOTE — Assessment & Plan Note (Signed)
Currently managed by Dr. Stanford Breed and Prescott Gum, most recent INR = 2.6

## 2010-11-04 NOTE — Assessment & Plan Note (Signed)
-  Flu shot given today

## 2010-11-04 NOTE — Assessment & Plan Note (Signed)
Patient's erythematous rash on gluteal cleft likely represents a fungal rash.  Patient reports occasional fungal rashes underneath her breasts, which are successfully treated with Nystatin powder, which the patient has already at home. -apply nystatin powder to affected area 2-3 times/day -return to care if area does not improve

## 2010-11-04 NOTE — Patient Instructions (Addendum)
For your diabetes, continue to use Lantus 30 units each night -continue to check your blood sugars three times per day, and use Novolog according to your sliding scale  For your fungal rash, apply nystatin powder to the area 3 times daily until resolution.  Please return in 3 months for your regular follow-up appointment.

## 2010-11-04 NOTE — Progress Notes (Signed)
HPI The patient is a 60 yo woman, with a history of HL, HTN, GERD, DM, COPD, anemia, and bilateral carotid artery stenosis, presenting for a follow-up visit.  The patient was recently hospitalized 8/1, underwent cardiac catheterization, DC cardioversion for a new-onset afib with RVR, porcine mitral valve replacement for mitral stenosis, and LA maze procedure for afib.  The patient was brought out of afib, but spontaneously reverted to afib, and was discharged on warfarin (follow-up with [facility]) and amiodarine, with plans for DC cardioversion in the near future.  Followed closely by Dr. Stanford Breed and Prescott Gum.  The patient notes good diabetes control since hospital discharge, with home CBG's in 140's-150's consistently (though patient did not bring her glucometer today), despite decreasing Lantus from 50's units qhs prior to hospitalization to 30 units qhs now.  She still has prandial insulin she can use on a sliding scale as needed, but has not needed to use this insulin since hospital discharge.  The patient also notes a red, peeling, non-tender, non-pruritic rash on her buttocks for the past 3 weeks.  The area produces no discharge or discomfort, but the patient wonders what the area is.  The patient notes a history of frequent tinea infections under her breasts, though no active infection now.  The patient's BP is well-controlled today, at 118/61.  ROS: General: no fevers, chills, changes in appetite HEENT: no blurry vision, hearing changes, sore throat Pulm: no dyspnea, coughing, wheezing CV: see HPI, no cp, SOB Abd: no abdominal pain, nausea/vomiting, diarrhea/constipation GU: no dysuria, hematuria, polyuria Ext: no arthralgias, myalgias Neuro: no weakness, numbness, or tingling  Filed Vitals:   11/04/10 1446  BP: 118/61  Pulse: 85  Temp: 96.5 F (35.8 C)    PEX General: alert, cooperative, and in no apparent distress HEENT: pupils equal round and reactive to light,  vision grossly intact, oropharynx clear and non-erythematous  Neck: supple, no lymphadenopathy, JVD, or carotid bruits Lungs: clear to ascultation bilaterally, normal work of respiration, no wheezes, rales, ronchi Heart: well-healing midline sternal wound noted, heart with irregular rhythm, regular rate, no murmurs, gallops, or rubs Abdomen: soft, non-tender, non-distended, normal bowel sounds   Gluteal: large erythematous, scaling patch noted in gluteal cleft, with no pain on palpation, skin breakdown, or discharge Extremities: 2+ DP/PT pulses bilaterally, no cyanosis, clubbing, or edema Neurologic: alert & oriented X3, cranial nerves II-XII intact, strength grossly intact, sensation intact to light touch  Assessment/Plan

## 2010-11-04 NOTE — Assessment & Plan Note (Signed)
Last LDL = 59, continue current management

## 2010-11-04 NOTE — Assessment & Plan Note (Signed)
BP well-controlled today, continue current regimen

## 2010-11-05 ENCOUNTER — Telehealth: Payer: Self-pay | Admitting: *Deleted

## 2010-11-05 DIAGNOSIS — E876 Hypokalemia: Secondary | ICD-10-CM

## 2010-11-05 MED ORDER — POTASSIUM CHLORIDE CRYS ER 20 MEQ PO TBCR: 20.0000 meq | EXTENDED_RELEASE_TABLET | Freq: Three times a day (TID) | ORAL | Status: DC

## 2010-11-05 NOTE — Progress Notes (Signed)
Dr Owens Shark discussed MS [place] Sizer with me and I have reviewed his note and agree with his assessment and plan.

## 2010-11-05 NOTE — Telephone Encounter (Signed)
Message copied by Cristopher Estimable on Tue Nov 05, 2010 11:19 AM ------      Message from: Lelon Perla      Created: Tue Nov 05, 2010  9:22 AM       Increase kcl from 40 daily to 60 meq daily; bmet 2 weeks            Kirk Ruths

## 2010-11-08 ENCOUNTER — Other Ambulatory Visit: Payer: Self-pay | Admitting: Cardiothoracic Surgery

## 2010-11-08 DIAGNOSIS — J90 Pleural effusion, not elsewhere classified: Secondary | ICD-10-CM

## 2010-11-11 ENCOUNTER — Ambulatory Visit (INDEPENDENT_AMBULATORY_CARE_PROVIDER_SITE_OTHER): Payer: Self-pay | Admitting: Internal Medicine

## 2010-11-11 ENCOUNTER — Encounter: Payer: Medicare Other | Admitting: *Deleted

## 2010-11-11 ENCOUNTER — Telehealth: Payer: Self-pay | Admitting: Cardiology

## 2010-11-11 LAB — POCT INR: INR: 2.9

## 2010-11-11 NOTE — Telephone Encounter (Signed)
Slight crackles to right lower lobe, having no symptoms, has appt with [facility] this week for xray, does she need lab work?

## 2010-11-11 NOTE — Telephone Encounter (Signed)
Left message for [name], no testing needed prior to appt with surgeon [name]

## 2010-11-13 ENCOUNTER — Telehealth: Payer: Self-pay | Admitting: Cardiology

## 2010-11-13 ENCOUNTER — Encounter: Payer: Self-pay | Admitting: Cardiothoracic Surgery

## 2010-11-13 ENCOUNTER — Ambulatory Visit: Payer: Medicare Other | Admitting: Cardiothoracic Surgery

## 2010-11-13 ENCOUNTER — Ambulatory Visit
Admission: RE | Admit: 2010-11-13 | Discharge: 2010-11-13 | Disposition: A | Discharge status: Home / Self Care | Payer: Medicare Other | Source: Ambulatory Visit | Attending: Cardiothoracic Surgery | Admitting: Cardiothoracic Surgery

## 2010-11-13 ENCOUNTER — Ambulatory Visit (INDEPENDENT_AMBULATORY_CARE_PROVIDER_SITE_OTHER): Payer: Self-pay | Admitting: Cardiothoracic Surgery

## 2010-11-13 VITALS — BP 118/63 | HR 92 | Resp 20 | Ht 62.0 in | Wt 169.0 lb

## 2010-11-13 DIAGNOSIS — I050 Rheumatic mitral stenosis: Secondary | ICD-10-CM

## 2010-11-13 DIAGNOSIS — J90 Pleural effusion, not elsewhere classified: Secondary | ICD-10-CM

## 2010-11-13 DIAGNOSIS — Z954 Presence of other heart-valve replacement: Secondary | ICD-10-CM

## 2010-11-13 NOTE — Telephone Encounter (Signed)
Pt calling with question to ask nurse. Pt feel like she is getting a cold and would like to speak w/ nurse about it. Please return pt call to discuss further.

## 2010-11-13 NOTE — Patient Instructions (Signed)
You may take pseudophed over the counter for cold symptoms.

## 2010-11-13 NOTE — Telephone Encounter (Signed)
Spoke with pt, she is having trouble with her voice getting hoarse since surgery. This comes and goes. Her sinuses were congested and she felt she was having trouble breathing but she has trouble with seasonal allergies. She has an appt today with the surgeon and has to get a cxr today. She will let them know of the problems she is having. Explained to pt she may still have some fluid in her lungs from surgery but the cxr will show that. She is also ready for cardioversion. Will get the green light to schedule from dr Stanford Breed and call pt back. Fredia Beets

## 2010-11-13 NOTE — Progress Notes (Signed)
HPI The patient returns for a second office followup after mitral valve replacement for mitral stenosis for evaluation of a persistent left pleural effusion. She is continued to progress well with home physical therapy. She is off her home oxygen. She started having some problems recently with an upper [place] or infection probably rhinitis or sinusitis with some clear nasal discharge. No significant productive cough. No evidence of weight gain ankle edema or CHF. She is remaining in atrial fibrillation and her heart rate seems to be faster now so that may be part of the issue with her dyspnea on exertion. She is scheduled for cardioversion per Dr. Jacalyn Lefevre office.  Current Outpatient Prescriptions  Medication Sig Dispense Refill  . albuterol (PROVENTIL,VENTOLIN) 90 MCG/ACT inhaler Inhale 1-2 puffs into the lungs every 4 (four) hours as needed.       . ALPRAZolam (XANAX) 1 MG tablet Take 1 tablet (1 mg total) by mouth 3 (three) times daily as needed for sleep or anxiety.  90 tablet  1  . amiodarone (PACERONE) 200 MG tablet Take 1 tablet (200 mg total) by mouth daily.  30 tablet  12  . aspirin 81 MG tablet Take 81 mg by mouth daily.        Marland Kitchen buPROPion (WELLBUTRIN XL) 150 MG 24 hr tablet Take 1 tablet (150 mg total) by mouth daily.  30 tablet  0  . docusate sodium (COLACE) 100 MG capsule Take 1 capsule (100 mg total) by mouth 2 (two) times daily.  60 capsule  7  . DULoxetine (CYMBALTA) 30 MG capsule Take 3 tablets by mouth once daily  90 capsule  7  . Fluticasone-Salmeterol (ADVAIR DISKUS) 250-50 MCG/DOSE AEPB Inhale 1 puff into the lungs every 12 (twelve) hours.        . furosemide (LASIX) 80 MG tablet Take 1 tablet (80 mg total) by mouth 2 (two) times daily.  60 tablet  12  . gabapentin (NEURONTIN) 300 MG capsule Take 1 capsule (300 mg total) by mouth 2 (two) times daily.  60 capsule  2  . HYDROcodone-acetaminophen (NORCO) 10-325 MG per tablet Take 1 tablet 4 times a day as needed for pain  120 tablet   5  . insulin aspart (NOVOLOG) 100 UNIT/ML injection 3 units to cover 3 carb choices each meal 3x a day. Add correction amount of Novolog to food Novolog if needed up to a total of 30 units per day: If blood sugar is: < 150= no correction, if 151-200 add 1 unit, if 201-250 add 2 units, if 251-300 add 3 units, if 301-350 add 4 units, if 351-400 add 5 units, if 401- 450 add 6 units, if > 450 please call office for instructions.  10 mL  11  . insulin glargine (LANTUS) 100 UNIT/ML injection Inject 30 Units into the skin at bedtime.        Marland Kitchen loratadine (CLARITIN) 10 MG tablet Take 1 tablet (10 mg total) by mouth daily.  30 tablet  7  . nicotine (NICODERM CQ - DOSED IN MG/24 HOURS) 21 mg/24hr patch Place 1 patch onto the skin daily.        Marland Kitchen omeprazole (PRILOSEC) 40 MG capsule Take 40 mg by mouth 2 (two) times daily.       . potassium chloride SA (K-DUR,KLOR-CON) 20 MEQ tablet Take 1 tablet (20 mEq total) by mouth 3 (three) times daily.  90 tablet  12  . rosuvastatin (CRESTOR) 20 MG tablet Take 1 tablet (20 mg total) by mouth  daily.  30 tablet  7  . tiotropium (SPIRIVA) 18 MCG inhalation capsule Place 1 capsule (18 mcg total) into inhaler and inhale daily.  30 capsule  7  . warfarin (COUMADIN) 2 MG tablet Take as directed by anticoagulation clinic  35 tablet  3  . glucose blood test strip 1 each by Other route as needed. Use as instructed       . Insulin Syringe-Needle U-100 (INSULIN SYRINGE .3CC/31GX5/16") 31G X 5/16" 0.3 ML MISC 1 Syringe by Does not apply route 2 (two) times daily at 10 AM and 5 PM.  100 each  11  . Insulin Syringe-Needle U-100 (INSULIN SYRINGE 1CC/30GX1/2") 30G X 1/2" 1 ML MISC 1 Container by Does not apply route 2 (two) times daily.  100 each  11     Review of Systems: The patient denies fever or night sweats. She denies palpitations.   Physical Exam Blood pressure 120/80 pulse 90 irregular saturation remained 9192%. The patient overall appears well not anxious at all. ENT is  normocephalic. Breath sounds are clear bilaterally with some diminished breath sounds at the left base consistent with mild elevation of left hemidiaphragm on chest x-ray. Cardiac rhythm is regular in atrial fibrillation. She has no murmur. There is no S3 gallop. Extremities reveal no edema.     Diagnostic Tests: The chest x-ray taken today shows no significant left pleural effusion. The left hemidiaphragm is mildly elevated probably from paresis following the surgery.   Impression: Stable course and good progression of recovery after mitral valve replacement for severe mitral stenosis with severe pulmonary edema. Persistent nature for relation to be addressed by Dr. Stanford Breed through medications and cardioversion.   Plan: The patient return here as needed. She was clearly and carefully counseled to completely stop smoking for ever. She also understands the importance of antibiotic prophylaxis before any significant dental work to prevent endocarditis of her bioprosthetic mitral valve.

## 2010-11-13 NOTE — Telephone Encounter (Signed)
Spoke with pt, concerns addressed and encouragement given [name]

## 2010-11-13 NOTE — Telephone Encounter (Signed)
Pt would like for you to call her back.

## 2010-11-15 ENCOUNTER — Telehealth: Payer: Self-pay | Admitting: Licensed Clinical Social Worker

## 2010-11-15 ENCOUNTER — Telehealth: Payer: Self-pay | Admitting: Cardiology

## 2010-11-15 NOTE — Telephone Encounter (Signed)
Patient has called looking for transit options.  She is recovering from open heart surgery of a few months ago.  She cannot take the bus due to multiple and chronic health issues.   Soc. Work will complete SCAT application and in the meantime [name] has the [employer] number to try and supplement rides she can get from her sister.   [name] was also inquiring about [employer] Me[employer] and I will call there and get her on the wait list.

## 2010-11-15 NOTE — Telephone Encounter (Signed)
Pt called and needs to change cardioversion appt.  Please call her back

## 2010-11-15 NOTE — Telephone Encounter (Signed)
Spoke with pt, she will need to have protime when she arrives at the hosp on Monday [name]

## 2010-11-18 ENCOUNTER — Telehealth: Payer: Self-pay | Admitting: Cardiology

## 2010-11-18 ENCOUNTER — Ambulatory Visit (HOSPITAL_COMMUNITY)
Admission: RE | Admit: 2010-11-18 | Discharge: 2010-11-18 | Disposition: A | Discharge status: Home / Self Care | Payer: Medicare Other | Source: Ambulatory Visit | Attending: Cardiology | Admitting: Cardiology

## 2010-11-18 DIAGNOSIS — J4489 Other specified chronic obstructive pulmonary disease: Secondary | ICD-10-CM | POA: Insufficient documentation

## 2010-11-18 DIAGNOSIS — IMO0001 Reserved for inherently not codable concepts without codable children: Secondary | ICD-10-CM | POA: Insufficient documentation

## 2010-11-18 DIAGNOSIS — I2789 Other specified pulmonary heart diseases: Secondary | ICD-10-CM | POA: Insufficient documentation

## 2010-11-18 DIAGNOSIS — D649 Anemia, unspecified: Secondary | ICD-10-CM | POA: Insufficient documentation

## 2010-11-18 DIAGNOSIS — Z01812 Encounter for preprocedural laboratory examination: Secondary | ICD-10-CM | POA: Insufficient documentation

## 2010-11-18 DIAGNOSIS — K219 Gastro-esophageal reflux disease without esophagitis: Secondary | ICD-10-CM | POA: Insufficient documentation

## 2010-11-18 DIAGNOSIS — I10 Essential (primary) hypertension: Secondary | ICD-10-CM | POA: Insufficient documentation

## 2010-11-18 DIAGNOSIS — F411 Generalized anxiety disorder: Secondary | ICD-10-CM | POA: Insufficient documentation

## 2010-11-18 DIAGNOSIS — J449 Chronic obstructive pulmonary disease, unspecified: Secondary | ICD-10-CM | POA: Insufficient documentation

## 2010-11-18 DIAGNOSIS — Z01810 Encounter for preprocedural cardiovascular examination: Secondary | ICD-10-CM | POA: Insufficient documentation

## 2010-11-18 DIAGNOSIS — I059 Rheumatic mitral valve disease, unspecified: Secondary | ICD-10-CM | POA: Insufficient documentation

## 2010-11-18 DIAGNOSIS — I509 Heart failure, unspecified: Secondary | ICD-10-CM | POA: Insufficient documentation

## 2010-11-18 DIAGNOSIS — I4891 Unspecified atrial fibrillation: Secondary | ICD-10-CM

## 2010-11-18 DIAGNOSIS — I6529 Occlusion and stenosis of unspecified carotid artery: Secondary | ICD-10-CM | POA: Insufficient documentation

## 2010-11-18 DIAGNOSIS — E119 Type 2 diabetes mellitus without complications: Secondary | ICD-10-CM | POA: Insufficient documentation

## 2010-11-18 LAB — BASIC METABOLIC PANEL
BUN: 23 mg/dL (ref 6–23)
CO2: 29 mEq/L (ref 19–32)
Calcium: 9.5 mg/dL (ref 8.4–10.5)
Chloride: 99 mEq/L (ref 96–112)
Creatinine, Ser: 1 mg/dL (ref 0.50–1.10)
GFR calc Af Amer: 69 mL/min — ABNORMAL LOW (ref >90)
GFR calc non Af Amer: 60 mL/min — ABNORMAL LOW (ref >90)
Glucose, Bld: 152 mg/dL — ABNORMAL HIGH (ref 70–99)
Potassium: 3.7 mEq/L (ref 3.5–5.1)
Sodium: 139 mEq/L (ref 135–145)

## 2010-11-18 LAB — CBC
HCT: 32.5 % — ABNORMAL LOW (ref 36.0–46.0)
Hemoglobin: 10 g/dL — ABNORMAL LOW (ref 12.0–15.0)
MCH: 26.5 pg (ref 26.0–34.0)
MCHC: 30.8 g/dL (ref 30.0–36.0)
MCV: 86 fL (ref 78.0–100.0)
Platelets: 304 10*3/uL (ref 150–400)
RBC: 3.78 MIL/uL — ABNORMAL LOW (ref 3.87–5.11)
RDW: 17.9 % — ABNORMAL HIGH (ref 11.5–15.5)
WBC: 11.1 10*3/uL — ABNORMAL HIGH (ref 4.0–10.5)

## 2010-11-18 LAB — PROTIME-INR
INR: 2.07 — ABNORMAL HIGH (ref 0.00–1.49)
Prothrombin Time: 23.7 seconds — ABNORMAL HIGH (ref 11.6–15.2)

## 2010-11-18 LAB — APTT: aPTT: 41 seconds — ABNORMAL HIGH (ref 24–37)

## 2010-11-18 NOTE — Telephone Encounter (Signed)
She just had cardioversion and asked Dr. Stanford Breed about Weds appt.  She asked but he did not give her an answer.  Please give her a call and let her know.

## 2010-11-18 NOTE — Telephone Encounter (Signed)
Spoke with pt, per dr Stanford Breed she will keep her appt wed this week [name]

## 2010-11-19 ENCOUNTER — Encounter: Payer: Self-pay | Admitting: *Deleted

## 2010-11-20 ENCOUNTER — Ambulatory Visit (INDEPENDENT_AMBULATORY_CARE_PROVIDER_SITE_OTHER): Payer: Medicare Other | Admitting: Cardiology

## 2010-11-20 ENCOUNTER — Encounter: Payer: Self-pay | Admitting: Cardiology

## 2010-11-20 ENCOUNTER — Other Ambulatory Visit: Payer: Medicare Other | Admitting: *Deleted

## 2010-11-20 DIAGNOSIS — I10 Essential (primary) hypertension: Secondary | ICD-10-CM

## 2010-11-20 DIAGNOSIS — I272 Pulmonary hypertension, unspecified: Secondary | ICD-10-CM

## 2010-11-20 DIAGNOSIS — I739 Peripheral vascular disease, unspecified: Secondary | ICD-10-CM

## 2010-11-20 DIAGNOSIS — I4891 Unspecified atrial fibrillation: Secondary | ICD-10-CM

## 2010-11-20 DIAGNOSIS — E785 Hyperlipidemia, unspecified: Secondary | ICD-10-CM

## 2010-11-20 DIAGNOSIS — I6529 Occlusion and stenosis of unspecified carotid artery: Secondary | ICD-10-CM

## 2010-11-20 DIAGNOSIS — I6523 Occlusion and stenosis of bilateral carotid arteries: Secondary | ICD-10-CM

## 2010-11-20 DIAGNOSIS — I050 Rheumatic mitral stenosis: Secondary | ICD-10-CM

## 2010-11-20 DIAGNOSIS — I509 Heart failure, unspecified: Secondary | ICD-10-CM

## 2010-11-20 DIAGNOSIS — I059 Rheumatic mitral valve disease, unspecified: Secondary | ICD-10-CM

## 2010-11-20 NOTE — Assessment & Plan Note (Signed)
>>  ASSESSMENT AND PLAN FOR CHRONIC CHF (CONGESTIVE HEART FAILURE) (HCC) WRITTEN ON 11/20/2010 12:13 PM BY CRENSHAW, Madolyn Frieze, MD  Continue present dose of Lasix. Her symptoms should improve now that she is in sinus rhythm.

## 2010-11-20 NOTE — Assessment & Plan Note (Signed)
Continue statin. 

## 2010-11-20 NOTE — Progress Notes (Signed)
[id number] female I saw in April of 2012 for evaluation of chest pain. Carotid Dopplers in May 2012 showed an 80-99% right and a 60-79% left stenosis. Patient had right carotid endarterectomy and is now followed by vascular surgery. Patient had recent workup for mitral stenosis. Cardiac catheterization in August of 2012 showed normal LV function, normal coronary arteries, severe mitral stenosis, severely elevated pulmonary capillary wedge pressure and severe pulmonary hypertension. The patient was admitted following the procedure because of atrial fibrillation. She deteriorated and ultimately had TEE cardioversion. The patient had mitral valve replacement with a 27-mm pericardial porcine valve and left atrial maze procedure for paroxysmal atrial fibrillation on September 21, 2010. Patient did require chest tube placement for pleural effusion postoperatively. She also developed atrial fibrillation postoperatively and has been treated with amiodarone. Patient had elective DCCV in Oct 2012. Since then, she does have some dyspnea on exertion but no orthopnea or PND. Her pedal edema is improving. Some residual chest soreness. No syncope.   Current Outpatient Prescriptions  Medication Sig Dispense Refill  . albuterol (PROVENTIL,VENTOLIN) 90 MCG/ACT inhaler Inhale 1-2 puffs into the lungs every 4 (four) hours as needed.       . ALPRAZolam (XANAX) 1 MG tablet Take 1 tablet (1 mg total) by mouth 3 (three) times daily as needed for sleep or anxiety.  90 tablet  1  . amiodarone (PACERONE) 200 MG tablet Take 1 tablet (200 mg total) by mouth daily.  30 tablet  12  . aspirin 81 MG tablet Take 81 mg by mouth daily.        Marland Kitchen buPROPion (WELLBUTRIN XL) 150 MG 24 hr tablet Take 1 tablet (150 mg total) by mouth daily.  30 tablet  0  . docusate sodium (COLACE) 100 MG capsule Take 1 capsule (100 mg total) by mouth 2 (two) times daily.  60 capsule  7  . DULoxetine (CYMBALTA) 30 MG capsule Take 3 tablets by mouth once daily  90  capsule  7  . Fluticasone-Salmeterol (ADVAIR DISKUS) 250-50 MCG/DOSE AEPB Inhale 1 puff into the lungs every 12 (twelve) hours.        . furosemide (LASIX) 80 MG tablet Take 1 tablet (80 mg total) by mouth 2 (two) times daily.  60 tablet  12  . gabapentin (NEURONTIN) 300 MG capsule Take 1 capsule (300 mg total) by mouth 2 (two) times daily.  60 capsule  2  . glucose blood test strip 1 each by Other route as needed. Use as instructed       . HYDROcodone-acetaminophen (NORCO) 10-325 MG per tablet Take 1 tablet 4 times a day as needed for pain  120 tablet  5  . insulin aspart (NOVOLOG) 100 UNIT/ML injection 3 units to cover 3 carb choices each meal 3x a day. Add correction amount of Novolog to food Novolog if needed up to a total of 30 units per day: If blood sugar is: < 150= no correction, if 151-200 add 1 unit, if 201-250 add 2 units, if 251-300 add 3 units, if 301-350 add 4 units, if 351-400 add 5 units, if 401- 450 add 6 units, if > 450 please call office for instructions.  10 mL  11  . insulin glargine (LANTUS) 100 UNIT/ML injection Inject 30 Units into the skin at bedtime.        . Insulin Syringe-Needle U-100 (INSULIN SYRINGE .3CC/31GX5/16") 31G X 5/16" 0.3 ML MISC 1 Syringe by Does not apply route 2 (two) times daily at 10 AM and  5 PM.  100 each  11  . Insulin Syringe-Needle U-100 (INSULIN SYRINGE 1CC/30GX1/2") 30G X 1/2" 1 ML MISC 1 Container by Does not apply route 2 (two) times daily.  100 each  11  . loratadine (CLARITIN) 10 MG tablet Take 1 tablet (10 mg total) by mouth daily.  30 tablet  7  . nicotine (NICODERM CQ - DOSED IN MG/24 HOURS) 21 mg/24hr patch Place 1 patch onto the skin daily.        Marland Kitchen omeprazole (PRILOSEC) 40 MG capsule Take 40 mg by mouth 2 (two) times daily.       . potassium chloride SA (K-DUR,KLOR-CON) 20 MEQ tablet Take 1 tablet (20 mEq total) by mouth 3 (three) times daily.  90 tablet  12  . rosuvastatin (CRESTOR) 20 MG tablet Take 1 tablet (20 mg total) by mouth daily.   30 tablet  7  . tiotropium (SPIRIVA) 18 MCG inhalation capsule Place 1 capsule (18 mcg total) into inhaler and inhale daily.  30 capsule  7  . warfarin (COUMADIN) 2 MG tablet Take as directed by anticoagulation clinic  35 tablet  3     Past Medical History  Diagnosis Date  . Depression   . GERD (gastroesophageal reflux disease)     Followed by [name] // small bowel capsule endoscopy (11/2009) - minimal chronic inflammation, otherwise negative for  H.pylori, metaplasia, dysplasia  . Hypertension   . Hyperlipidemia   . Diabetes mellitus type II, uncontrolled     on insulin therapy  . COPD (chronic obstructive pulmonary disease) 2008    not on home O2 // her CT of the chest there was no evidence of primary carcinoid tumor , scattered normal-sized mediastinal and hilar as well as axillary lymph nodes noted., no significant lymphadenopathy. COPD and emphysema with no acute cardiopulmonary disease noted , mild cardiomegaly and solitary calcified  plaque  . Moderate to severe pulmonary hypertension     severe per TEE (08/2010) - Peak RV-RA gradient 15m Hg  . Mitral regurgitation     mild to moderate per TEE (08/2010)  . Shingles   . Anemia     Anemia panel (08/2009) - iron 21, TIBC 441, UIBC 420,  ferritin 11 // Pt has been unable to tolerate iron supplementation due to chronic abdominal discomfort, source of which still is not clear.  .Marland KitchenHistory of colonic polyps     Colonoscopy (10/2005) - colon polyp with path nonadenomatous, no malignancy - by Dr. [name] // Colonoscopy (02/2002) - 2 polyps noted, 1 - adenomatous, 2 - hyperplastic // Followed by Dr. [name] . Anxiety   . IBS (irritable bowel syndrome)   . Osteoporosis     DEXA (12/2004) - L-spine T score -2.6, Left hip -0.1  . Fibromyalgia   . Obstructive sleep apnea     on CPAP. // Sleep study (06/2009) - Moderate sleep apnea/ hypopnea syndrome , AHI 17.8 per hour with nonpositional hypopneas. CPAP titration to 12 CWP, AHI 2.4 per  hour. Small resMed Quattro full-face mask with heated humidifier  . Restless leg syndrome   . Mitral stenosis     moderate per TEE (08/2010) // [name] . Bilateral carotid artery stenosis     s/p right endarterectomy (06/2010). // Carotid [place](07/2010) -  60%-79% ICA stenosis, mid range of scale by velocity.  Left: Moderate-to-severe calcific and non-calcific plaque origin andproximal ICA and ECA.  60%-79% ICA stenosis, highest end of scale //  Followed by [name].    Past Surgical History  Procedure Date  . Orif clavicle fracture 01/2004    by Thana Farr. [name], M.D for Right clavicle nonunion.  . Tonsillectomy   . Carotid endarterectomy 07/04/2010    right, by [name] for asymptomatic right carotid artery stenosis  . Lipoma exision 08/2005    occipital lipoma 1.5cm - by Dr. [name]  . Hysteroscopy with endometrial ablation 06/2001    for persistent post-menopausal bleeding // by S. [name], M.D.  . Mitral valve replacement      with a 27-mm pericardial porcine valve (Medtronic Mosaic valve, serial #[id number]). 09/20/10, Dr Prescott Gum  . Maze   . Placement of left chest tube. 09/24/2010    Dr Prescott Gum    History   Social History  . Marital Status: Divorced    Spouse Name: N/A    Number of Children: N/A  . Years of Education: N/A   Occupational History  . Not on file.   Social History Main Topics  . Smoking status: Former Smoker -- 1.0 packs/day for 50 years    Types: Cigarettes    Quit date: 07/12/2010  . Smokeless tobacco: Not on file  . Alcohol Use: No  . Drug Use: No  . Sexually Active: No   Other Topics Concern  . Not on file   Social History Narrative  . No narrative on file    ROS: no fevers or chills, productive cough, hemoptysis, dysphasia, odynophagia, melena, hematochezia, dysuria, hematuria, rash, seizure activity, orthopnea, PND, pedal edema, claudication. Remaining systems are negative.  Physical  Exam: Well-developed well-nourished in no acute distress.  Skin is warm and dry.  HEENT is normal.  Neck is supple. No thyromegaly. Bilateral bruits Chest is diminished breath sounds Cardiovascular exam is regular rate and rhythm.  Abdominal exam nontender or distended. No masses palpated. Extremities show 1+ edema. neuro grossly intact  ECG sinus rhythm with first degree AV block. Right axis deviation. Nonspecific T-wave changes.

## 2010-11-20 NOTE — Assessment & Plan Note (Signed)
Will plan ABIs with Doppler in the future as she recovers from her recent surgery.

## 2010-11-20 NOTE — Assessment & Plan Note (Signed)
Blood pressure controlled. Continue present medications. 

## 2010-11-20 NOTE — Assessment & Plan Note (Signed)
Patient remains in sinus rhythm status post cardioversion. Continue amiodarone. Check TSH, liver functions and chest x-ray when she returns in 3 months. I would like to possibly discontinue amiodarone includes months to see if she maintains sinus rhythm following her maze procedure. Continue Coumadin.

## 2010-11-20 NOTE — Assessment & Plan Note (Signed)
Continue statin. Management per vascular surgery.

## 2010-11-20 NOTE — Assessment & Plan Note (Signed)
Continue present dose of Lasix. Her symptoms should improve now that she is in sinus rhythm.

## 2010-11-20 NOTE — Assessment & Plan Note (Signed)
Continue present medications.

## 2010-11-20 NOTE — Assessment & Plan Note (Signed)
Status post mitral valve replacement. Continued SBE prophylaxis. Schedule baseline echo.

## 2010-11-20 NOTE — Patient Instructions (Signed)
Your physician wants you to follow-up in: 3 MONTHS You will receive a reminder letter in the mail two months in advance. If you don't receive a letter, please call our office to schedule the follow-up appointment.   Your physician has requested that you have an echocardiogram. Echocardiography is a painless test that uses sound waves to create images of your heart. It provides your doctor with information about the size and shape of your heart and how well your heart's chambers and valves are working. This procedure takes approximately one hour. There are no restrictions for this procedure.

## 2010-11-24 NOTE — Op Note (Signed)
  [name], [name] NO.:  [id number]  MEDICAL RECORD NO.:  [id number]  LOCATION:  [facility]                         FACILITY:  [facility]  PHYSICIAN:  Denice Bors. Stanford Breed, MD, FACCDATE OF BIRTH:  07/09/1950  DATE OF PROCEDURE:  11/18/2010 DATE OF DISCHARGE:                              OPERATIVE REPORT   This is cardioversion of atrial fibrillation.  The patient was sedated by anesthesia with Diprivan 60 mg intravenously. Synchronized cardioversion with 120 joules resulting in sinus rhythm with a first-degree AV block.  There were no immediate complications. We would recommend continuing on medications including Coumadin. Followup as scheduled.     Denice Bors Stanford Breed, MD, [facility]     BSC/MEDQ  D:  11/18/2010  T:  11/18/2010  Job:  [id number]  Electronically Signed by Kirk Ruths MD [facility] on 11/24/2010 05:37:09 PM

## 2010-11-25 ENCOUNTER — Ambulatory Visit (INDEPENDENT_AMBULATORY_CARE_PROVIDER_SITE_OTHER): Payer: Self-pay | Admitting: Cardiology

## 2010-11-25 ENCOUNTER — Telehealth: Payer: Self-pay | Admitting: Cardiology

## 2010-11-25 DIAGNOSIS — R0989 Other specified symptoms and signs involving the circulatory and respiratory systems: Secondary | ICD-10-CM

## 2010-11-25 LAB — POCT INR: INR: 2.4

## 2010-11-25 NOTE — Telephone Encounter (Signed)
Spoke with amy,she will do one more protime and then will end the relationship.[name]

## 2010-11-25 NOTE — Telephone Encounter (Signed)
Home health nurse called about whether she needs to continue seeing the pt

## 2010-11-26 ENCOUNTER — Other Ambulatory Visit (HOSPITAL_COMMUNITY): Payer: Medicare Other | Admitting: Radiology

## 2010-11-26 LAB — URINALYSIS, ROUTINE W REFLEX MICROSCOPIC
Bilirubin Urine: NEGATIVE
Glucose, UA: NEGATIVE
Hgb urine dipstick: NEGATIVE
Ketones, ur: NEGATIVE
Nitrite: NEGATIVE
Protein, ur: NEGATIVE
Specific Gravity, Urine: 1.024
Urobilinogen, UA: 1
pH: 7

## 2010-11-26 LAB — URINE MICROSCOPIC-ADD ON

## 2010-11-26 LAB — COMPREHENSIVE METABOLIC PANEL
ALT: 20
AST: 18
Albumin: 3.3 — ABNORMAL LOW
Alkaline Phosphatase: 54
BUN: 18
CO2: 28
Calcium: 8.5
Chloride: 99
Creatinine, Ser: 0.77
GFR calc Af Amer: >60
GFR calc non Af Amer: >60
Glucose, Bld: 111 — ABNORMAL HIGH
Potassium: 3.2 — ABNORMAL LOW
Sodium: 133 — ABNORMAL LOW
Total Bilirubin: 0.6
Total Protein: 6.5

## 2010-11-26 LAB — DIFFERENTIAL
Basophils Absolute: 0
Basophils Relative: 1
Eosinophils Absolute: 0.1
Eosinophils Relative: 1
Lymphocytes Relative: 28
Lymphs Abs: 2.3
Monocytes Absolute: 0.8 — ABNORMAL HIGH
Monocytes Relative: 10
Neutro Abs: 5
Neutrophils Relative %: 61

## 2010-11-26 LAB — CBC
HCT: 30.2 — ABNORMAL LOW
Hemoglobin: 9.9 — ABNORMAL LOW
MCHC: 32.8
MCV: 77.2 — ABNORMAL LOW
Platelets: 258
RBC: 3.91
RDW: 17.8 — ABNORMAL HIGH
WBC: 8.2

## 2010-11-26 LAB — LIPASE, BLOOD: Lipase: 13

## 2010-11-26 NOTE — Telephone Encounter (Signed)
Mailed SCAT application to Daisi to sign and return.

## 2010-11-30 ENCOUNTER — Emergency Department (HOSPITAL_COMMUNITY)
Admission: EM | Admit: 2010-11-30 | Discharge: 2010-11-30 | Disposition: A | Discharge status: Home / Self Care | Payer: Medicare Other | Attending: Emergency Medicine | Admitting: Emergency Medicine

## 2010-11-30 ENCOUNTER — Inpatient Hospital Stay (INDEPENDENT_AMBULATORY_CARE_PROVIDER_SITE_OTHER)
Admission: RE | Admit: 2010-11-30 | Discharge: 2010-11-30 | Disposition: A | Discharge status: Home / Self Care | Payer: Medicare Other | Source: Ambulatory Visit | Attending: Emergency Medicine | Admitting: Emergency Medicine

## 2010-11-30 DIAGNOSIS — R499 Unspecified voice and resonance disorder: Secondary | ICD-10-CM | POA: Insufficient documentation

## 2010-11-30 DIAGNOSIS — Z7982 Long term (current) use of aspirin: Secondary | ICD-10-CM | POA: Insufficient documentation

## 2010-11-30 DIAGNOSIS — IMO0001 Reserved for inherently not codable concepts without codable children: Secondary | ICD-10-CM | POA: Insufficient documentation

## 2010-11-30 DIAGNOSIS — M810 Age-related osteoporosis without current pathological fracture: Secondary | ICD-10-CM | POA: Insufficient documentation

## 2010-11-30 DIAGNOSIS — E119 Type 2 diabetes mellitus without complications: Secondary | ICD-10-CM | POA: Insufficient documentation

## 2010-11-30 DIAGNOSIS — Z794 Long term (current) use of insulin: Secondary | ICD-10-CM | POA: Insufficient documentation

## 2010-11-30 DIAGNOSIS — Z79899 Other long term (current) drug therapy: Secondary | ICD-10-CM | POA: Insufficient documentation

## 2010-11-30 DIAGNOSIS — J4489 Other specified chronic obstructive pulmonary disease: Secondary | ICD-10-CM | POA: Insufficient documentation

## 2010-11-30 DIAGNOSIS — R490 Dysphonia: Secondary | ICD-10-CM | POA: Insufficient documentation

## 2010-11-30 DIAGNOSIS — J449 Chronic obstructive pulmonary disease, unspecified: Secondary | ICD-10-CM | POA: Insufficient documentation

## 2010-11-30 DIAGNOSIS — R070 Pain in throat: Secondary | ICD-10-CM | POA: Insufficient documentation

## 2010-11-30 DIAGNOSIS — I4891 Unspecified atrial fibrillation: Secondary | ICD-10-CM | POA: Insufficient documentation

## 2010-11-30 DIAGNOSIS — Z954 Presence of other heart-valve replacement: Secondary | ICD-10-CM | POA: Insufficient documentation

## 2010-12-01 ENCOUNTER — Telehealth: Payer: Self-pay | Admitting: Physician Assistant

## 2010-12-01 NOTE — Telephone Encounter (Signed)
Pt called re: Lasix and K dose for today. She wants to hold meds for today only (160 mg Lasix qAM), due to a very sore throat. She denys sxs suggestive of CHF or LE edema.   I advised her that it was ok to hold for today, but to confer with her ordering MD as to why she is on so much Lasix.

## 2010-12-02 ENCOUNTER — Telehealth: Payer: Self-pay | Admitting: *Deleted

## 2010-12-02 ENCOUNTER — Telehealth: Payer: Self-pay | Admitting: Cardiology

## 2010-12-02 NOTE — Telephone Encounter (Signed)
Continue lasix 80 mg po Bid [name]

## 2010-12-02 NOTE — Telephone Encounter (Signed)
Pt calling in regards to pt RX. Pt said she thinks there is a misunderstanding somewhere. Pt said MD made comment about pt fluid pill and then PA he made the same comment so pt doesn't know if she is taking too much of it or what. Please return pt call to discuss further.

## 2010-12-02 NOTE — Telephone Encounter (Signed)
PT AWARE OF LAB RESULTS./CY

## 2010-12-02 NOTE — Telephone Encounter (Signed)
Pt called and wants to know if she should continue to take 51m 2 tabs daily (total 168m of Lasix  (instead of bid she takes it all at once so that she is not up all night).  She feels that Dr CrStanford Breedxpressed concern on the high dose she is taking at her last visit.  Mrs ElCourtoistates she had a MVR 09/21/10.  She states she was sent to a rehab center and while there, developed a lot of edema.  Her Lasix was increased to 8037mid on 10/11/10 (per telephone note).  She states no one has ever told her to go back to her lower dose of lasix and she wants to know if she should decrease.  She states she hasn't noticed any edema recently.  She states occasionally her legs feel tight but there is no noticeable swelling.  She is concerned because a PA she talked to this weekend also expressed a concern over her high dose of lasix.

## 2010-12-02 NOTE — Telephone Encounter (Signed)
SEE OTHER PHONE NOTE./CY

## 2010-12-02 NOTE — Telephone Encounter (Signed)
Call from pt stating that she went to Urgent Care and the ER this past week-end.  Was referred to the ENT to have a test done.  The test requires that a light be run through her nasal cavity.  Pt was told that would need to go the ENT for the test as the Clinics could not be able to do this.

## 2010-12-05 ENCOUNTER — Ambulatory Visit (INDEPENDENT_AMBULATORY_CARE_PROVIDER_SITE_OTHER): Payer: Medicare Other | Admitting: Internal Medicine

## 2010-12-05 ENCOUNTER — Encounter: Payer: Self-pay | Admitting: Internal Medicine

## 2010-12-05 DIAGNOSIS — R0609 Other forms of dyspnea: Secondary | ICD-10-CM

## 2010-12-05 DIAGNOSIS — J029 Acute pharyngitis, unspecified: Secondary | ICD-10-CM

## 2010-12-05 DIAGNOSIS — R0989 Other specified symptoms and signs involving the circulatory and respiratory systems: Secondary | ICD-10-CM

## 2010-12-05 DIAGNOSIS — R0600 Dyspnea, unspecified: Secondary | ICD-10-CM

## 2010-12-05 DIAGNOSIS — E119 Type 2 diabetes mellitus without complications: Secondary | ICD-10-CM

## 2010-12-05 LAB — GLUCOSE, CAPILLARY: Glucose-Capillary: 138 mg/dL — ABNORMAL HIGH (ref 70–99)

## 2010-12-05 MED ORDER — INSULIN GLARGINE 100 UNIT/ML ~~LOC~~ SOLN: 35.0000 [IU] | Freq: Every day | SUBCUTANEOUS | Status: DC

## 2010-12-05 NOTE — Progress Notes (Signed)
Subjective:   Patient ID: [name] female   DOB: 08-10-1950 60 y.o.   MRN: [id number]  HPI: [name] is a 60 y.o. with a PMHx of Non-O2 dependent COPD, DMII, HTN, mitral stenosis with recent porcine valve replacement, who presented to clinic today for the following:  1) Sore throat - pt indicates persistent sore throat since recent complicated hospitalization in August 2012 during which she received Mitral valve replacement with a 27-mm pericardial porcine valve due to severe mitral stenosis and pulmonary hypertension. As well, she received Left atrial maze procedure for paroxysmal atrial fibrillation. During this hospitalization, pt required endotracheal intubation x 2, and also had a TEE for initial evaluation of her mitral valve. She has noted persistence of throat pain, and difficulty swallowing large pills, and solid meat products since this time. However, over the last 2-3 days, the symptoms seemed to have worsened, secondary to which she was evaluated at an urgent care facility. During the UC visit, she was found to have an oxygen saturation of 83%, secondary to which she was transported to [facility] ED for evaluation, sats were in the 90s, so she was discharged home without supplemental oxygen. She was recommended follow-up with ENT as an outpatient, however states that she is unable to afford the visit (she apparently was told it would cost her $400 for the laryngoscopy and $130 for the office visit, of which she would be responsible for 20%).   Today, pt indicates that her sore throat is persistent. She describes difficulty with swallowing large pills, causing her to have to crush her potassium supplementation into applesauce to allow for ease of swallowing. She also feels meat products get stuck in her throat. She otherwise denies recent sick contact, nasal congestion, fevers, chills, lymphadenopathy. States she stopped smoking in August 2012! However, prior to which she was smoking 50  pack years at least. Denies recent weight loss. No family history of esophageal or laryngeal cancers.    2) Shortness of breath - Pt has a history of COPD (non-oxygen dependent thus far, prior 50 pack year smoking history), OSA on sleep apnea, and recent mitral valve replacement for severe mitral stenosis. She has multiple co-morbidities that can be contributing towards her chronic shortness of breath. She does note improvement of symptoms from prior to surgery, but still continuing to feel fatigued and short of breath with ambulating shorter distances. For her chronic anemia, she did have an anemia panel in 08/2009 showing ferritin of 11 and iron level of 21, was recommended but she states she discontinued iron supplementation secondary to GI upset. Last H/H on 10/8 showed Hgb of 10, which is her baseline. She denies recent blood in stools or other obvious source of bleeding, is up to date for her screening exams.  Also denies recent new cough, nasal congestion, sick contacts, etc as detailed above. She denies associated chest pain, pain with deep inspiration, leg swelling beyond baseline, prolonged immobilization, orthopnea, or PND.    3) DMII Lab Results  Component Value Date   HGBA1C 7.2* 09/18/2010   Patient checking blood sugars 3 times daily, before breakfast, lunch, and dinner. Reports fasting blood sugars of 160-180s mg/dL. Currently taking Lantus 31 units qHS. 0 hypoglycemic episodes since last visit. denies polyuria, polydipsia, nausea, vomiting, diarrhea.  does not request refills today.   Review of Systems: Per HPI.  Current Outpatient Medications: Medication Sig  . albuterol (PROVENTIL,VENTOLIN) 90 MCG/ACT inhaler Inhale 1-2 puffs into the lungs every 4 (four) hours  as needed.   . ALPRAZolam (XANAX) 1 MG tablet Take 1 tablet (1 mg total) by mouth 3 (three) times daily as needed for sleep or anxiety.  Marland Kitchen amiodarone (PACERONE) 200 MG tablet Take 1 tablet (200 mg total) by mouth daily.    Marland Kitchen aspirin 81 MG tablet Take 81 mg by mouth daily.    Marland Kitchen buPROPion (WELLBUTRIN XL) 150 MG 24 hr tablet Take 1 tablet (150 mg total) by mouth daily.  Marland Kitchen docusate sodium (COLACE) 100 MG capsule Take 1 capsule (100 mg total) by mouth 2 (two) times daily.  . DULoxetine (CYMBALTA) 30 MG capsule Take 3 tablets by mouth once daily  . Fluticasone-Salmeterol (ADVAIR DISKUS) 250-50 MCG/DOSE AEPB Inhale 1 puff into the lungs every 12 (twelve) hours.    . furosemide (LASIX) 80 MG tablet Take 1 tablet (80 mg total) by mouth 2 (two) times daily.  Marland Kitchen gabapentin (NEURONTIN) 300 MG capsule Take 1 capsule (300 mg total) by mouth 2 (two) times daily.  Marland Kitchen glucose blood test strip 1 each by Other route as needed. Use as instructed   . HYDROcodone-acetaminophen (NORCO) 10-325 MG per tablet Take 1 tablet 4 times a day as needed for pain  . insulin aspart (NOVOLOG) 100 UNIT/ML injection 3 units to cover 3 carb choices each meal 3x a day. Add correction amount of Novolog to food Novolog if needed up to a total of 30 units per day: If blood sugar is: < 150= no correction, if 151-200 add 1 unit, if 201-250 add 2 units, if 251-300 add 3 units, if 301-350 add 4 units, if 351-400 add 5 units, if 401- 450 add 6 units, if > 450 please call office for instructions.  . insulin glargine (LANTUS) 100 UNIT/ML injection Inject 30 Units into the skin at bedtime.    . Insulin Syringe-Needle U-100 (INSULIN SYRINGE .3CC/31GX5/16") 31G X 5/16" 0.3 ML MISC 1 Syringe by Does not apply route 2 (two) times daily at 10 AM and 5 PM.  . Insulin Syringe-Needle U-100 (INSULIN SYRINGE 1CC/30GX1/2") 30G X 1/2" 1 ML MISC 1 Container by Does not apply route 2 (two) times daily.  Marland Kitchen loratadine (CLARITIN) 10 MG tablet Take 1 tablet (10 mg total) by mouth daily.  . nicotine (NICODERM CQ - DOSED IN MG/24 HOURS) 21 mg/24hr patch Place 1 patch onto the skin daily.    Marland Kitchen omeprazole (PRILOSEC) 40 MG capsule Take 40 mg by mouth 2 (two) times daily.   . potassium  chloride SA (K-DUR,KLOR-CON) 20 MEQ tablet Take 1 tablet (20 mEq total) by mouth 3 (three) times daily.  . rosuvastatin (CRESTOR) 20 MG tablet Take 1 tablet (20 mg total) by mouth daily.  Marland Kitchen tiotropium (SPIRIVA) 18 MCG inhalation capsule Place 1 capsule (18 mcg total) into inhaler and inhale daily.  Marland Kitchen warfarin (COUMADIN) 2 MG tablet Take as directed by anticoagulation clinic    Allergies: Allergen Reactions  . Ativan     Patient's sister noted that ativan caused the patient to become extremely confused during hospitalization 09/2010  . Morphine     REACTION: Unknown reaction  . Percocet (Oxycodone-Acetaminophen)     headache  . Tramadol Hcl     REACTION: swelling    Past Medical History:  Diagnosis Date  . Depression   . GERD (gastroesophageal reflux disease)     Followed by [name] // small bowel capsule endoscopy (11/2009) - minimal chronic inflammation, otherwise negative for  H.pylori, metaplasia, dysplasia  . Hypertension   . Hyperlipidemia   .  Diabetes mellitus type II, uncontrolled     on insulin therapy  . COPD (chronic obstructive pulmonary disease) 2008    not on home O2 // her CT of the chest there was no evidence of primary carcinoid tumor , scattered normal-sized mediastinal and hilar as well as axillary lymph nodes noted., no significant lymphadenopathy. COPD and emphysema with no acute cardiopulmonary disease noted , mild cardiomegaly and solitary calcified  plaque  . Moderate to severe pulmonary hypertension     severe per TEE (08/2010) - Peak RV-RA gradient 9m Hg  . Mitral regurgitation     mild to moderate per TEE (08/2010)  . Shingles   . Anemia     Anemia panel (08/2009) - iron 21, TIBC 441, UIBC 420,  ferritin 11 // Pt has been unable to tolerate iron supplementation due to chronic abdominal discomfort, source of which still is not clear.  .Marland KitchenHistory of colonic polyps     Colonoscopy (10/2005) - colon polyp with path nonadenomatous, no malignancy - by Dr.  [name] // Colonoscopy (02/2002) - 2 polyps noted, 1 - adenomatous, 2 - hyperplastic // Followed by Dr. [name] . Anxiety   . IBS (irritable bowel syndrome)   . Osteoporosis     DEXA (12/2004) - L-spine T score -2.6, Left hip -0.1  . Fibromyalgia   . Obstructive sleep apnea     on CPAP. // Sleep study (06/2009) - Moderate sleep apnea/ hypopnea syndrome , AHI 17.8 per hour with nonpositional hypopneas. CPAP titration to 12 CWP, AHI 2.4 per hour. Small resMed Quattro full-face mask with heated humidifier  . Restless leg syndrome   . Mitral stenosis     moderate per TEE (08/2010) // Mitral valve replacement with a 27-mm pericardial porcine valve (Medtronic Mosaic valve, serial ##[id number] on 09/20/2010 - PIvin Poot// [name] . Bilateral carotid artery stenosis     s/p right endarterectomy (06/2010). // Carotid [place]orea(07/2010) -  60%-79% ICA stenosis, mid range of scale by velocity.  Left: Moderate-to-severe calcific and non-calcific plaque origin andproximal ICA and ECA.  60%-79% ICA stenosis, highest end of scale // Followed by [name]    Past Surgical History  Procedure Date  . Orif clavicle fracture 01/2004    by MThana Farr YLorin Mercy M.D for Right clavicle nonunion.  . Tonsillectomy   . Carotid endarterectomy 07/04/2010    right, by Dr. BTrula Sladefor asymptomatic right carotid artery stenosis  . Lipoma exision 08/2005    occipital lipoma 1.5cm - by Dr. LRebekah Chesterfield . Hysteroscopy with endometrial ablation 06/2001    for persistent post-menopausal bleeding // by S. DOlena Mater M.D.  . Mitral valve replacement 09/20/10     with a 27-mm pericardial porcine valve (Medtronic Mosaic valve, serial ##[id number]. 09/20/10, Dr VPrescott Gum . Left atrial maze procedure 09/20/10    for paroxysmal atrial fibrillation (Dr. VPrescott Gum  . Placement of left chest tube. 09/24/2010    Dr VLucianne LeiTKerby Less    Objective:   Physical Exam: Filed Vitals:   12/05/10 1549  BP:  127/70  Pulse: 78  Temp: 97 F (36.1 C)  Oxygen saturation:  90-94% at rest (pt's baseline)    84-88% with ambulation  General: Vital signs reviewed and noted. Well-developed, well-nourished, in no acute distress; alert, appropriate and cooperative throughout examination.  Head/ Neck: Normocephalic, atraumatic. No cervical, maxillary, or mandibular lymphadenopathy appreciated.  Nose: Nonerythematous, no significant mucosal edema.  Ears: TM nonerythematous,  not bulging, canals without edema.  Throat: Mildly erythematous without tonsillar enlargement.  Lungs:  Normal respiratory effort. Clear to auscultation BL without crackles or wheezes. Decreased breath sounds diffusely.  Heart: RRR. S1 and S2 normal.  Abdomen:  BS normoactive. Soft, Nondistended, non-tender.  No masses or organomegaly.  Extremities: No pretibial edema.    Assessment & Plan:  Case and plan of care discussed with Dr. Milta Deiters.   Greater than 45 minutes were spent in coordinating care with the specialists, counseling the patient regarding her current conditions with greater than 50% of time spent in face-to-face discussions.

## 2010-12-05 NOTE — Patient Instructions (Addendum)
Please follow-up at the clinic in 2 weeks with me, at which time we will reevaluate your sore throat and diabetes.  Please increase your Lantus to 35 units once daily. Please decrease back to 32 units if you are having blood sugars < 70 mg/dL.   Please follow-up in the clinic sooner if needed.  If you have been started on new medication(s), and you develop symptoms concerning for allergic reaction, including, but not limited to, throat closing, tongue swelling, rash, please stop the medication immediately and call the clinic at [phone], and go to the ER.  If you are diabetic, please bring your meter to your next visit.  If symptoms worsen, or new symptoms arise, please call the clinic or go to the ER.  Please bring all of your medications in a bag to your next visit.

## 2010-12-06 ENCOUNTER — Encounter: Payer: Self-pay | Admitting: Internal Medicine

## 2010-12-06 DIAGNOSIS — R0609 Other forms of dyspnea: Secondary | ICD-10-CM | POA: Insufficient documentation

## 2010-12-06 DIAGNOSIS — J029 Acute pharyngitis, unspecified: Secondary | ICD-10-CM | POA: Insufficient documentation

## 2010-12-06 DIAGNOSIS — R0600 Dyspnea, unspecified: Secondary | ICD-10-CM | POA: Insufficient documentation

## 2010-12-06 DIAGNOSIS — R0602 Shortness of breath: Secondary | ICD-10-CM | POA: Insufficient documentation

## 2010-12-06 NOTE — Progress Notes (Signed)
Pt aware of appt with Dr Constance Holster 12/06/10 3:20PM - info faxed to office. Hilda Blades Ditzler RN 12/06/10 10:30AM

## 2010-12-06 NOTE — Assessment & Plan Note (Addendum)
Lab Results  Component Value Date   HGBA1C 7.2* 09/18/2010   HGBA1C 8.1* 09/13/2010   HGBA1C 8.6 07/24/2010   Lab Results  Component Value Date   MICROALBUR 1.60 11/09/2009   LDLCALC 59 08/03/2010   CREATININE 1.00 11/18/2010    Assessment:  Disease Control: not controlled  Progress toward goals: improved  Barriers to meeting goals: no barriers identified   Plan:  Glucometer log was reviewed today, as pt did have glucometer available for review.   Increase Lantus to 35 units as she is staying in 160-280s throughout the day.  Reminded to bring blood glucose meter & log to each visit

## 2010-12-06 NOTE — Assessment & Plan Note (Addendum)
Pt's shortness of breath and DOE is likely multifactorial secondary to her COPD (non-oxygen dependent thus far, prior 50 pack year smoking history), OSA on sleep apnea, and recent mitral valve replacement for severe mitral stenosis, and possible significant deconditioning secondary to recent major surgery. I am less concerned that her anemia is acutely contributing to her shortness of breath, as her Hgb is at baseline, she is not having large GI bleeding or other sources of identifiable bleed.  We checked her oxygen saturations with rest 90s and with ambulation 84-88%, which suggests that she may benefit from home oxygen therapy. I discussed the case with Dr. Stann Mainland, who was concerned that since pt's oxygen saturation previously was maintained in the 90s without oxygen, and she has had recent significant surgery, possibly avute component to her current symptoms, the ambulatory oxygen saturations may not represent her true baseline. Pt is also not getting significantly short of breath, having mental status changes, etc. Therefore, he recommended to have this pt return in 2 weeks, if saturations continue to be in 80s with ambulation, we will start home oxygen therapy.   Plan:  Return in 2 weeks  Will recheck oxygen saturation with ambulation again on next visit - will consider starting home oxygen at that time.  Pt counseled that if symptoms worsen, she should return to er for further evaluation.  Congratulated on smoking cessation and encouraged/advised to continue her efforts.

## 2010-12-06 NOTE — Assessment & Plan Note (Addendum)
Pt describes sore throat and difficulty with swallowing pills, meat since her hospitalization in August 2012 during which time she required endotracheal intubation x 2 and TEE. It is unclear at this time the cause of her symptoms with concern for possible post-operative sore throat, due to mucosal injury and resulting inflammation from instrumentation required for intubation as well as the foreign body (endotracheal tube itself) causing resultant irritation of the pharynx, larynx, or trachea. Pt also has a significant tobacco abuse history including a prior 50 pack year smoking history, quit in August 2012. Therefore, cancerous lesions are also considered, although do not completely fit with the timeline of symptom presentation. She has been started on multiple new cardiac meds during her last hospitalizations, however denies other symptoms including tongue swelling, rash, etc that would be expected if symptoms were reflective of medication allergy.  Plan:  Will need to follow-up with ENT for further evaluation and treatment with consideration for laryngoscopy.  I spoke with Dr. Constance Holster, ENT on call, about Ms. Colglazier's case and inability to follow-up with outpatient ENT due to financial constraints. He kindly agreed to see the pt on 12/06/2010 PM.  Will call to schedule appt for pt with Dr. Constance Holster.

## 2010-12-09 ENCOUNTER — Other Ambulatory Visit: Payer: Self-pay | Admitting: Otolaryngology

## 2010-12-09 DIAGNOSIS — K117 Disturbances of salivary secretion: Secondary | ICD-10-CM

## 2010-12-16 ENCOUNTER — Other Ambulatory Visit: Payer: Medicare Other

## 2010-12-16 ENCOUNTER — Ambulatory Visit (INDEPENDENT_AMBULATORY_CARE_PROVIDER_SITE_OTHER): Payer: Medicare Other | Admitting: *Deleted

## 2010-12-16 ENCOUNTER — Ambulatory Visit (HOSPITAL_COMMUNITY): Payer: Medicare Other | Attending: Cardiology | Admitting: Radiology

## 2010-12-16 ENCOUNTER — Telehealth: Payer: Self-pay | Admitting: Licensed Clinical Social Worker

## 2010-12-16 DIAGNOSIS — I509 Heart failure, unspecified: Secondary | ICD-10-CM | POA: Insufficient documentation

## 2010-12-16 DIAGNOSIS — I4891 Unspecified atrial fibrillation: Secondary | ICD-10-CM

## 2010-12-16 DIAGNOSIS — I10 Essential (primary) hypertension: Secondary | ICD-10-CM | POA: Insufficient documentation

## 2010-12-16 DIAGNOSIS — E785 Hyperlipidemia, unspecified: Secondary | ICD-10-CM | POA: Insufficient documentation

## 2010-12-16 DIAGNOSIS — Z954 Presence of other heart-valve replacement: Secondary | ICD-10-CM | POA: Insufficient documentation

## 2010-12-16 DIAGNOSIS — I059 Rheumatic mitral valve disease, unspecified: Secondary | ICD-10-CM

## 2010-12-16 DIAGNOSIS — R079 Chest pain, unspecified: Secondary | ICD-10-CM | POA: Insufficient documentation

## 2010-12-16 DIAGNOSIS — I079 Rheumatic tricuspid valve disease, unspecified: Secondary | ICD-10-CM | POA: Insufficient documentation

## 2010-12-16 DIAGNOSIS — E119 Type 2 diabetes mellitus without complications: Secondary | ICD-10-CM | POA: Insufficient documentation

## 2010-12-16 LAB — POCT INR: INR: 2.4

## 2010-12-16 NOTE — Telephone Encounter (Signed)
Referral was made to [facility] thru [place] based on deteriorating health, COPD, recent heart surgery, and unsteady gait.  Wait list is about 3 months.

## 2010-12-18 ENCOUNTER — Telehealth: Payer: Self-pay | Admitting: Cardiology

## 2010-12-18 NOTE — Telephone Encounter (Signed)
New message:  Pt called and has a question about following up with another physician.  She is not sure what she was suppose to do.  Please call her back.

## 2010-12-19 ENCOUNTER — Ambulatory Visit (INDEPENDENT_AMBULATORY_CARE_PROVIDER_SITE_OTHER): Payer: Medicare Other | Admitting: Internal Medicine

## 2010-12-19 ENCOUNTER — Encounter: Payer: Self-pay | Admitting: Internal Medicine

## 2010-12-19 DIAGNOSIS — M797 Fibromyalgia: Secondary | ICD-10-CM

## 2010-12-19 DIAGNOSIS — I272 Pulmonary hypertension, unspecified: Secondary | ICD-10-CM

## 2010-12-19 DIAGNOSIS — R0600 Dyspnea, unspecified: Secondary | ICD-10-CM

## 2010-12-19 DIAGNOSIS — IMO0001 Reserved for inherently not codable concepts without codable children: Secondary | ICD-10-CM

## 2010-12-19 DIAGNOSIS — R0609 Other forms of dyspnea: Secondary | ICD-10-CM

## 2010-12-19 DIAGNOSIS — R0989 Other specified symptoms and signs involving the circulatory and respiratory systems: Secondary | ICD-10-CM

## 2010-12-19 DIAGNOSIS — I2789 Other specified pulmonary heart diseases: Secondary | ICD-10-CM

## 2010-12-19 DIAGNOSIS — E119 Type 2 diabetes mellitus without complications: Secondary | ICD-10-CM

## 2010-12-19 DIAGNOSIS — J029 Acute pharyngitis, unspecified: Secondary | ICD-10-CM

## 2010-12-19 LAB — POCT GLYCOSYLATED HEMOGLOBIN (HGB A1C): Hemoglobin A1C: 6.7

## 2010-12-19 LAB — GLUCOSE, CAPILLARY: Glucose-Capillary: 159 mg/dL — ABNORMAL HIGH (ref 70–99)

## 2010-12-19 MED ORDER — INSULIN GLARGINE 100 UNIT/ML ~~LOC~~ SOLN: 37.0000 [IU] | Freq: Every day | SUBCUTANEOUS | Status: DC

## 2010-12-19 MED ORDER — HYDROCODONE-ACETAMINOPHEN 5-325 MG PO TABS: ORAL_TABLET | ORAL | Status: DC

## 2010-12-19 NOTE — Telephone Encounter (Signed)
Spoke with pt, questions answered [name]  

## 2010-12-19 NOTE — Patient Instructions (Signed)
   Please follow-up at the clinic in 1 month, at which time we will reevaluate your shortness of breath, blood pressure, diabetes.  Please increase your lantus to 37 units once daily, you can decrease back to 35 if having blood sugars < 70.  We will call you to get you set up for [employer] to provide home oxygen.  I am refilling your prescription for your pain meds, we will need to update your pain contract during the next visit.  Please follow-up in the clinic sooner if needed.  If you have been started on new medication(s), and you develop symptoms concerning for allergic reaction, including, but not limited to, throat closing, tongue swelling, rash, please stop the medication immediately and call the clinic at [phone], and go to the ER.  If you are diabetic, please bring your meter to your next visit.  If symptoms worsen, or new symptoms arise, please call the clinic or go to the ER.  Please bring all of your medications in a bag to your next visit.

## 2010-12-19 NOTE — Progress Notes (Signed)
Subjective:   Patient ID: [name] female   DOB: May 28, 1950 60 y.o.   MRN: [id number]  HPI:   1) DM, II - HgA1c 6.7 - Patient checking blood sugars 1 times daily, before breakfast. Reports fasting blood sugars of 130-160s mg/dL. Currently taking insulin, Lantus 35 units, and Novolog per sliding scale. 0 hypoglycemic episodes since last visit. denies polyuria, polydipsia, nausea, vomiting, diarrhea.  does not request refills today.  2) SOB - during our last clinic evaluation on 12/05/2010, pt noted a chronic shortness of breath, particularly with walking even short distances (such as one side of her house to another). It was thought that her shortness of breath and DOE is likely multifactorial secondary to her COPD, OSA on sleep apnea, and recent mitral valve replacement for severe mitral stenosis, and possible significant deconditioning secondary to recent major surgery. During our last visit, we checked her oxygen saturations with rest 90s and with ambulation 84-88%, which suggests that she may benefit from home oxygen therapy. I discussed the case with Dr. Stann Mainland, who was concerned that since pt's oxygen saturation previously was maintained in the 90s without oxygen, and she has had recent significant surgery, possibly avute component to her current symptoms, the ambulatory oxygen saturations may not represent her true baseline. Pt is also not getting significantly short of breath, having mental status changes, etc. Therefore, he recommended to have this pt return in 2 weeks, if saturations continue to be in 80s with ambulation, we will start home oxygen therapy. S  Since our visit on 10/25, pt has continued to have DOE and chronic shortness of breath. No change from prior. No new fevers, chills, cough, chest pain, orthopnea, PND, lower extremity edema, nasal congestion.   3) Chronic pain secondary to fibromyalgia - pt requests to switch from her Percocet 10/325 mg q6h PRN to 1-2 tabs of 5/318m  q6h PRN as pain is stabilizing, and with the higher dose of Percocet, she experiences drowsiness, occasional sleepiness. Otherwise, no other change in her diffuse body pain or knee pain symptoms from her baseline.  4) Sore throat - During our last visit visit, pt indicated persistent sore throat since August 2012 when she was intubated. She was referred to Dr. RConstance Holster(ENT) for consideration for laryngoscopy given her history of smoking. Dr. RConstance Holsterfelt that pt's symptoms were moreso consistent with chronic dry mouth/ throat. He recommended hydration, and consideration for a barium swallow. The pt canceled her barium swallow visit because [facility]. Since her visit with Dr. RConstance Holster pt has increased daily hydration, and has noted improvement of her sore throat symptoms. She otherwise also denies recent sick contact, nasal congestion, fevers, chills, lymphadenopathy. States she stopped smoking in August 2012! However, prior to which she was smoking 50 pack years at least. Denies recent weight loss. No family history of esophageal or laryngeal cancers. Does not want to have this barium swallow done at this time.  Review of Systems: Per HPI.  Current Outpatient Medications: Medication Sig  . albuterol (PROVENTIL,VENTOLIN) 90 MCG/ACT inhaler Inhale 1-2 puffs into the lungs every 4 (four) hours as needed.   . ALPRAZolam (XANAX) 1 MG tablet Take 1 tablet (1 mg total) by mouth 3 (three) times daily as needed for sleep or anxiety.  .Marland Kitchenamiodarone (PACERONE) 200 MG tablet Take 1 tablet (200 mg total) by mouth daily.  .Marland Kitchenaspirin 81 MG tablet Take 81 mg by  mouth daily.    Marland Kitchen buPROPion (WELLBUTRIN XL) 150 MG 24 hr tablet Take 1 tablet (150 mg total) by mouth daily.  Marland Kitchen docusate sodium (COLACE) 100 MG capsule Take 1 capsule (100 mg total) by mouth 2 (two) times daily.  . DULoxetine (CYMBALTA) 30 MG capsule Take 3 tablets by mouth once daily  .  Fluticasone-Salmeterol (ADVAIR DISKUS) 250-50 MCG/DOSE AEPB Inhale 1 puff into the lungs every 12 (twelve) hours.    . furosemide (LASIX) 80 MG tablet Take 1 tablet (80 mg total) by mouth 2 (two) times daily.  Marland Kitchen gabapentin (NEURONTIN) 300 MG capsule Take 1 capsule (300 mg total) by mouth 2 (two) times daily.  Marland Kitchen glucose blood test strip 1 each by Other route as needed. Use as instructed   . HYDROcodone-acetaminophen (NORCO) 10-325 MG per tablet Take 1 tablet 4 times a day as needed for pain  . insulin aspart (NOVOLOG) 100 UNIT/ML injection 3 units to cover 3 carb choices each meal 3x a day. Add correction amount of Novolog to food Novolog if needed up to a total of 30 units per day: If blood sugar is: < 150= no correction, if 151-200 add 1 unit, if 201-250 add 2 units, if 251-300 add 3 units, if 301-350 add 4 units, if 351-400 add 5 units, if 401- 450 add 6 units, if > 450 please call office for instructions.  . insulin glargine (LANTUS) 100 UNIT/ML injection Inject 35 Units into the skin at bedtime.  . Insulin Syringe-Needle U-100 (INSULIN SYRINGE .3CC/31GX5/16") 31G X 5/16" 0.3 ML MISC 1 Syringe by Does not apply route 2 (two) times daily at 10 AM and 5 PM.  . Insulin Syringe-Needle U-100 (INSULIN SYRINGE 1CC/30GX1/2") 30G X 1/2" 1 ML MISC 1 Container by Does not apply route 2 (two) times daily.  Marland Kitchen loratadine (CLARITIN) 10 MG tablet Take 1 tablet (10 mg total) by mouth daily.  . nicotine (NICODERM CQ - DOSED IN MG/24 HOURS) 21 mg/24hr patch Place 1 patch onto the skin daily.    Marland Kitchen omeprazole (PRILOSEC) 40 MG capsule Take 40 mg by mouth 2 (two) times daily.   . potassium chloride SA (K-DUR,KLOR-CON) 20 MEQ tablet Take 1 tablet (20 mEq total) by mouth 3 (three) times daily.  . rosuvastatin (CRESTOR) 20 MG tablet Take 1 tablet (20 mg total) by mouth daily.  Marland Kitchen tiotropium (SPIRIVA) 18 MCG inhalation capsule Place 1 capsule (18 mcg total) into inhaler and inhale daily.  Marland Kitchen warfarin (COUMADIN) 2 MG tablet  Take as directed by anticoagulation clinic    Allergies: Allergen Reactions  . Ativan     Patient's sister noted that ativan caused the patient to become extremely confused during hospitalization 09/2010  . Morphine     REACTION: Unknown reaction  . Percocet (Oxycodone-Acetaminophen)     headache  . Tramadol Hcl     REACTION: swelling    Past Medical History  Diagnosis Date  . Depression   . GERD (gastroesophageal reflux disease)     Followed by [name] // small bowel capsule endoscopy (11/2009) - minimal chronic inflammation, otherwise negative for  H.pylori, metaplasia, dysplasia  . Hypertension   . Hyperlipidemia   . Diabetes mellitus type II, uncontrolled     on insulin therapy  . COPD (chronic obstructive pulmonary disease) 2008    not on home O2 // her CT of the chest there was no evidence of primary carcinoid tumor , scattered normal-sized mediastinal and hilar as well as axillary lymph nodes noted.,  no significant lymphadenopathy. COPD and emphysema with no acute cardiopulmonary disease noted , mild cardiomegaly and solitary calcified  plaque  . Moderate to severe pulmonary hypertension     severe per TEE (08/2010) - Peak RV-RA gradient 5m Hg  . Mitral regurgitation     mild to moderate per TEE (08/2010)  . Shingles   . Anemia     Anemia panel (08/2009) - iron 21, TIBC 441, UIBC 420,  ferritin 11 // Pt has been unable to tolerate iron supplementation due to chronic abdominal discomfort, source of which still is not clear.  .Marland KitchenHistory of colonic polyps     Colonoscopy (10/2005) - colon polyp with path nonadenomatous, no malignancy - by Dr. [name] // Colonoscopy (02/2002) - 2 polyps noted, 1 - adenomatous, 2 - hyperplastic // Followed by Dr. [name] . Anxiety   . IBS (irritable bowel syndrome)   . Osteoporosis     DEXA (12/2004) - L-spine T score -2.6, Left hip -0.1  . Fibromyalgia   . Obstructive sleep apnea     on CPAP. // Sleep study (06/2009) - Moderate sleep  apnea/ hypopnea syndrome , AHI 17.8 per hour with nonpositional hypopneas. CPAP titration to 12 CWP, AHI 2.4 per hour. Small resMed Quattro full-face mask with heated humidifier  . Restless leg syndrome   . Mitral stenosis     moderate per TEE (08/2010) // Mitral valve replacement with a 27-mm pericardial porcine valve (Medtronic Mosaic valve, serial ##[id number] on 09/20/2010 - PIvin Poot// [name] . Bilateral carotid artery stenosis     s/p right endarterectomy (06/2010). // Carotid [place](07/2010) -  60%-79% ICA stenosis, mid range of scale by velocity.  Left: Moderate-to-severe calcific and non-calcific plaque origin andproximal ICA and ECA.  60%-79% ICA stenosis, highest end of scale // Followed by [name]    Past Surgical History  Procedure Date  . Orif clavicle fracture 01/2004    by MThana Farr YLorin Mercy M.D for Right clavicle nonunion.  . Tonsillectomy   . Carotid endarterectomy 07/04/2010    right, by Dr. BTrula Sladefor asymptomatic right carotid artery stenosis  . Lipoma exision 08/2005    occipital lipoma 1.5cm - by Dr. LRebekah Chesterfield . Hysteroscopy with endometrial ablation 06/2001    for persistent post-menopausal bleeding // by S. DOlena Mater M.D.  . Mitral valve replacement 09/20/10     with a 27-mm pericardial porcine valve (Medtronic Mosaic valve, serial ##[id number]. 09/20/10, Dr VPrescott Gum . Left atrial maze procedure 09/20/10    for paroxysmal atrial fibrillation (Dr. VPrescott Gum  . Placement of left chest tube. 09/24/2010    Dr VLucianne LeiTKerby Less    Objective:   Physical Exam: Filed Vitals:   12/19/10 1620  BP: 143/86  Pulse: 88  Temp: 97 F (36.1 C)    Oxygen saturation:  92-97% at rest (pt's baseline)    82% with ambulation  General: Vital signs reviewed and noted. Well-developed, well-nourished, in no acute distress; alert, appropriate and cooperative throughout examination.  Head/ Neck: Normocephalic, atraumatic. No cervical,  maxillary, or mandibular lymphadenopathy appreciated.  Nose: Nonerythematous, no significant mucosal edema.  Ears: TM nonerythematous, not bulging, canals without edema.  Throat: Mildly erythematous without tonsillar enlargement.  Lungs:  Normal respiratory effort. Clear to auscultation BL without crackles or wheezes. Decreased breath sounds diffusely.  Heart: RRR. S1 and S2 normal.  Abdomen:  BS normoactive. Soft, Nondistended, non-tender.  No masses or organomegaly.  Extremities:  No pretibial edema.     Assessment & Plan:  Case and plan of care discussed with Dr. [name].

## 2010-12-20 MED ORDER — HYDROCODONE-ACETAMINOPHEN 5-325 MG PO TABS: ORAL_TABLET | ORAL | Status: DC

## 2010-12-23 ENCOUNTER — Telehealth: Payer: Self-pay | Admitting: Licensed Clinical Social Worker

## 2010-12-23 NOTE — Telephone Encounter (Signed)
Sherria called from [place] and said [place] had been approved for [employer] transportation.   Last week I also made a referral to [employer] so that she could be placed on the wait list.

## 2010-12-24 ENCOUNTER — Telehealth: Payer: Self-pay | Admitting: *Deleted

## 2010-12-24 NOTE — Telephone Encounter (Signed)
Pt calls to check on her referrals from last visit home health, home O2

## 2010-12-25 ENCOUNTER — Other Ambulatory Visit: Payer: Self-pay | Admitting: *Deleted

## 2010-12-25 DIAGNOSIS — E1142 Type 2 diabetes mellitus with diabetic polyneuropathy: Secondary | ICD-10-CM

## 2010-12-25 MED ORDER — GABAPENTIN 300 MG PO CAPS: 300.0000 mg | ORAL_CAPSULE | Freq: Two times a day (BID) | ORAL | Status: DC

## 2010-12-25 NOTE — Assessment & Plan Note (Signed)
Pt's shortness of breath and DOE is likely multifactorial secondary to her COPD (non-oxygen dependent thus far, prior 50 pack year smoking history), OSA on sleep apnea, and recent mitral valve replacement for severe mitral stenosis, severe pulmonary hypertension and possible significant deconditioning secondary to recent major surgery. Importantly, the pt has stopped smoking!!!  We checked her oxygen saturations with rest 90s and with ambulation 82%. Also in the setting of her severe pulmonary hypertension this collectively indicates that the patient now needs home oxygen therapy.   Plan:  Will consult [facility] for setting up the pt with home oxygen in the setting of her severe COPD and severe pulmonary hypertension.  Pt counseled that if symptoms worsen, she should return to er for further evaluation.  Congratulated on smoking cessation and encouraged/advised to continue her efforts.

## 2010-12-25 NOTE — Assessment & Plan Note (Signed)
Has been seen by Dr. Constance Holster (ENT) who thinks this is related to chronic dry mouth/ throat. She was recommended keeping hydrated and has had improvement of symptoms. She was also recommended barium swallow, however, pt canceled the procedure because she was supposed to get it at an outside facility and was unsure if this would be covered by her insurance. She does not want it done at this time because of her other multiple comorbidities that need to be attended to now.

## 2010-12-25 NOTE — Assessment & Plan Note (Addendum)
Will change to Norco 5/325 mg 1-2 tabs q6h PRN, #240 / month from Norco 10/391m q6h PRN, #120/month at the request of pt.   Counseled not to take more than 1g of tylenol/hour or more than 4g/day  Counseled to avoid taking with alcohol, avoid driving or operative heavy machinery with this medication.  Will need to fill out new pain contract next visit.  RX faxed to [facility] pharmacy.  DC Norco

## 2010-12-25 NOTE — Assessment & Plan Note (Signed)
Lab Results  Component Value Date   HGBA1C 6.7 12/19/2010   HGBA1C 7.2* 09/18/2010   HGBA1C 8.1* 09/13/2010   Lab Results  Component Value Date   MICROALBUR 1.60 11/09/2009   LDLCALC 59 08/03/2010   CREATININE 1.00 11/18/2010    Assessment:  Disease Control: controlled  Progress toward goals: improved  Barriers to meeting goals: no barriers identified   Plan: Glucometer log was reviewed today, as pt did have glucometer available for review.      will increase basal insulin to 37 units as blood sugars regularly in the 160-180s preprandial to breakfast. Up to 200s if pt eats evening snacks.  discussed diet, avoid late night snacks  Instructed to reduce back to 35 units qHS if persistently having blood sugars < 70 mg/dL.

## 2010-12-26 ENCOUNTER — Encounter: Payer: Self-pay | Admitting: Internal Medicine

## 2010-12-26 DIAGNOSIS — R682 Dry mouth, unspecified: Secondary | ICD-10-CM

## 2010-12-26 DIAGNOSIS — K117 Disturbances of salivary secretion: Secondary | ICD-10-CM | POA: Insufficient documentation

## 2010-12-27 ENCOUNTER — Other Ambulatory Visit: Payer: Self-pay | Admitting: *Deleted

## 2010-12-27 DIAGNOSIS — F419 Anxiety disorder, unspecified: Secondary | ICD-10-CM

## 2010-12-27 DIAGNOSIS — I10 Essential (primary) hypertension: Secondary | ICD-10-CM

## 2010-12-27 DIAGNOSIS — R252 Cramp and spasm: Secondary | ICD-10-CM

## 2010-12-27 DIAGNOSIS — E119 Type 2 diabetes mellitus without complications: Secondary | ICD-10-CM

## 2010-12-27 DIAGNOSIS — J449 Chronic obstructive pulmonary disease, unspecified: Secondary | ICD-10-CM

## 2010-12-27 DIAGNOSIS — K219 Gastro-esophageal reflux disease without esophagitis: Secondary | ICD-10-CM

## 2010-12-27 DIAGNOSIS — D649 Anemia, unspecified: Secondary | ICD-10-CM

## 2010-12-27 MED ORDER — ALPRAZOLAM 1 MG PO TABS: 1.0000 mg | ORAL_TABLET | Freq: Three times a day (TID) | ORAL | Status: DC | PRN

## 2010-12-27 MED ORDER — FLUTICASONE-SALMETEROL 250-50 MCG/DOSE IN AEPB: 1.0000 | INHALATION_SPRAY | Freq: Two times a day (BID) | RESPIRATORY_TRACT | Status: DC

## 2010-12-27 MED ORDER — ALBUTEROL 90 MCG/ACT IN AERS: 1.0000 | INHALATION_SPRAY | RESPIRATORY_TRACT | Status: DC | PRN

## 2010-12-27 NOTE — Telephone Encounter (Signed)
Glenda, please call in her Xanax. Thank you! I hope you have a great weekend!  [name], D.O.

## 2010-12-27 NOTE — Telephone Encounter (Signed)
Alprazolam rx called to [facility].

## 2011-01-06 ENCOUNTER — Ambulatory Visit (INDEPENDENT_AMBULATORY_CARE_PROVIDER_SITE_OTHER): Payer: Medicare Other | Admitting: *Deleted

## 2011-01-06 DIAGNOSIS — I4891 Unspecified atrial fibrillation: Secondary | ICD-10-CM

## 2011-01-06 LAB — POCT INR: INR: 2.2

## 2011-01-13 ENCOUNTER — Telehealth: Payer: Self-pay | Admitting: Cardiology

## 2011-01-13 NOTE — Telephone Encounter (Signed)
Pt is confused about somethings like who is to order her cardio rehab and things like that

## 2011-01-13 NOTE — Telephone Encounter (Signed)
Spoke with pt, questions answered. Cardiac rehab order to be signed and faxed [facility]

## 2011-01-23 ENCOUNTER — Encounter: Payer: Self-pay | Admitting: Internal Medicine

## 2011-01-23 ENCOUNTER — Other Ambulatory Visit: Payer: Self-pay | Admitting: *Deleted

## 2011-01-23 ENCOUNTER — Ambulatory Visit (INDEPENDENT_AMBULATORY_CARE_PROVIDER_SITE_OTHER): Payer: Medicare Other | Admitting: Internal Medicine

## 2011-01-23 DIAGNOSIS — M797 Fibromyalgia: Secondary | ICD-10-CM

## 2011-01-23 DIAGNOSIS — N644 Mastodynia: Secondary | ICD-10-CM

## 2011-01-23 DIAGNOSIS — K5909 Other constipation: Secondary | ICD-10-CM

## 2011-01-23 DIAGNOSIS — Z720 Tobacco use: Secondary | ICD-10-CM

## 2011-01-23 DIAGNOSIS — E119 Type 2 diabetes mellitus without complications: Secondary | ICD-10-CM

## 2011-01-23 DIAGNOSIS — F17200 Nicotine dependence, unspecified, uncomplicated: Secondary | ICD-10-CM

## 2011-01-23 DIAGNOSIS — IMO0001 Reserved for inherently not codable concepts without codable children: Secondary | ICD-10-CM

## 2011-01-23 DIAGNOSIS — K5900 Constipation, unspecified: Secondary | ICD-10-CM

## 2011-01-23 DIAGNOSIS — I10 Essential (primary) hypertension: Secondary | ICD-10-CM

## 2011-01-23 LAB — GLUCOSE, CAPILLARY: Glucose-Capillary: 126 mg/dL — ABNORMAL HIGH (ref 70–99)

## 2011-01-23 MED ORDER — INSULIN ASPART 100 UNIT/ML ~~LOC~~ SOLN: SUBCUTANEOUS | Status: DC

## 2011-01-23 MED ORDER — INSULIN GLARGINE 100 UNIT/ML ~~LOC~~ SOLN: 40.0000 [IU] | Freq: Every day | SUBCUTANEOUS | Status: DC

## 2011-01-23 MED ORDER — POLYETHYLENE GLYCOL 3350 17 GM/SCOOP PO POWD: 17.0000 g | Freq: Every day | ORAL | Status: AC | PRN

## 2011-01-23 NOTE — Progress Notes (Signed)
Subjective:   Patient ID: [name] female   DOB: Jul 16, 1950 60 y.o.   MRN: [id number]  HPI: [name] is a 60 y.o. with a PMHx of O2-dependent COPD, DMII, HTN, mitral stenosis with recent porcine valve replacement, who presented to clinic today for the following:  1) DM, II - HgA1c 6.7 (12/2010) - Patient checking blood sugars 1 times daily, before breakfast. Reports fasting blood sugars of 150-260 mg/dL. Currently taking insulin, Lantus 37 units, and Novolog per sliding scale, typically taking 3-4 units of Novolog per meal. 0 hypoglycemic episodes since last visit. denies polyuria, polydipsia, nausea, vomiting, diarrhea.  does not request refills today.  2) SOB - during our last clinic evaluation on 12/05/2010, pt noted a chronic shortness of breath, particularly with walking even short distances (such as one side of her house to another). She returned to clinic in 12/2010, and continued to be hypoxic with ambulation, therefore, she was arranged for home oxygen. She states that since oxygen initiated, is having some improvement in shortness of breath. No new fevers, chills, cough, chest pain, orthopnea, PND, lower extremity edema, nasal congestion. Still occasionally, getting lightheaded and tired after walking from one side of the house to another.   3) Chronic pain secondary to fibromyalgia - pt requests refills of the percocet 1-2 tabs of 5/316m q6h PRN as pain is stabilizing, and with the higher dose of Percocet, she experiences drowsiness, occasional sleepiness. She is well controlled on this regimen. Otherwise, no other change in her diffuse body pain or knee pain symptoms from her baseline.  4) Constipation - states she continues to suffer with her chronic constipation, requiring occasional doubling of her colace.   5) Breast pain - states she is having occasional achy BL breast pain and tenderness without abnormal discoloration or rashes of skin, no retraction of skin, no  abnormal lumps/ bumps. No nipple discharge, weight loss. No history of abnormal mammograms, is due for her mammogram in 02/2011.  6) HTN - Patient does not check blood pressure regularly at home. Currently taking lasix, amiodarone. denies headaches, confirms positional lightheadedness when quickly moves from sitting to standing or laying to sitting positions.  does not request refills today.    Review of Systems: Per HPI.   Current Outpatient Medications: Medication Sig  . albuterol (PROVENTIL,VENTOLIN) 90 MCG/ACT inhaler Inhale 1-2 puffs into the lungs every 4 (four) hours as needed.  . ALPRAZolam (XANAX) 1 MG tablet Take 1 tablet (1 mg total) by mouth 3 (three) times daily as needed for sleep or anxiety.  .Marland Kitchenamiodarone (PACERONE) 200 MG tablet Take 1 tablet (200 mg total) by mouth daily.  .Marland Kitchenaspirin 81 MG tablet Take 81 mg by mouth daily.    .Marland KitchenbuPROPion (WELLBUTRIN XL) 150 MG 24 hr tablet Take 1 tablet (150 mg total) by mouth daily.  .Marland Kitchendocusate sodium (COLACE) 100 MG capsule Take 1 capsule (100 mg total) by mouth 2 (two) times daily.  . DULoxetine (CYMBALTA) 30 MG capsule Take 3 tablets by mouth once daily  . Fluticasone-Salmeterol (ADVAIR DISKUS) 250-50 MCG/DOSE AEPB Inhale 1 puff into the lungs every 12 (twelve) hours.  . furosemide (LASIX) 80 MG tablet Take 1 tablet (80 mg total) by mouth 2 (two) times daily.  .Marland Kitchengabapentin (NEURONTIN) 300 MG capsule Take 1 capsule (300 mg total) by mouth 2 (two) times daily.  .Marland Kitchenglucose blood test strip 1 each by Other route as needed. Use as instructed   . HYDROcodone-acetaminophen (NORCO) 5-325 MG  per tablet 1-2 tablets every 6 hours as needed for pain. Do not take with alcohol, do no drive or operate heavy machinery if drowsiness occurs. Do not take more than instructed.  . insulin aspart (NOVOLOG) 100 UNIT/ML injection 3 units to cover 3 carb choices each meal 3x a day. Add correction amount of Novolog to food Novolog if needed up to a total of 30 units  per day: If blood sugar is: < 150= no correction, if 151-200 add 1 unit, if 201-250 add 2 units, if 251-300 add 3 units, if 301-350 add 4 units, if 351-400 add 5 units, if 401- 450 add 6 units, if > 450 please call office for instructions.  . insulin glargine (LANTUS) 100 UNIT/ML injection Inject 37 Units into the skin at bedtime.  . Insulin Syringe-Needle U-100 (INSULIN SYRINGE .3CC/31GX5/16") 31G X 5/16" 0.3 ML MISC 1 Syringe by Does not apply route 2 (two) times daily at 10 AM and 5 PM.  . Insulin Syringe-Needle U-100 (INSULIN SYRINGE 1CC/30GX1/2") 30G X 1/2" 1 ML MISC 1 Container by Does not apply route 2 (two) times daily.  Marland Kitchen loratadine (CLARITIN) 10 MG tablet Take 1 tablet (10 mg total) by mouth daily.  Marland Kitchen omeprazole (PRILOSEC) 40 MG capsule Take 40 mg by mouth 2 (two) times daily.   . potassium chloride SA (K-DUR,KLOR-CON) 20 MEQ tablet Take 1 tablet (20 mEq total) by mouth 3 (three) times daily.  . rosuvastatin (CRESTOR) 20 MG tablet Take 1 tablet (20 mg total) by mouth daily.  Marland Kitchen tiotropium (SPIRIVA) 18 MCG inhalation capsule Place 1 capsule (18 mcg total) into inhaler and inhale daily.  Marland Kitchen warfarin (COUMADIN) 2 MG tablet Take as directed by anticoagulation clinic     Allergies: Allergen Reactions  . Ativan     Patient's sister noted that ativan caused the patient to become extremely confused during hospitalization 09/2010  . Morphine     REACTION: Unknown reaction  . Percocet (Oxycodone-Acetaminophen)     headache  . Tramadol Hcl     REACTION: swelling    Past Medical History  Diagnosis Date  . Depression   . GERD (gastroesophageal reflux disease)     Followed by [name] // small bowel capsule endoscopy (11/2009) - minimal chronic inflammation, otherwise negative for  H.pylori, metaplasia, dysplasia  . Hypertension   . Hyperlipidemia   . Diabetes mellitus type II, uncontrolled     on insulin therapy  . COPD (chronic obstructive pulmonary disease) 2008    started home O2  (12/2010) // Prior significant smoker  // her CT of the chest there was no evidence of primary carcinoid tumor , scattered normal-sized mediastinal and hilar as well as axillary lymph nodes noted., no significant lymphadenopathy. COPD and emphysema with no acute cardiopulmonary disease noted , mild cardiomegaly and solitary calcified  plaque  . Moderate to severe pulmonary hypertension     severe per TEE (08/2010) - Peak RV-RA gradient 51m Hg  . Mitral regurgitation     mild to moderate per TEE (08/2010)  . Shingles   . Anemia     Anemia panel (08/2009) - iron 21, TIBC 441, UIBC 420,  ferritin 11 // Pt has been unable to tolerate iron supplementation due to chronic abdominal discomfort, source of which still is not clear.  .Marland KitchenHistory of colonic polyps     Colonoscopy (10/2005) - colon polyp with path nonadenomatous, no malignancy - by Dr. [name] // Colonoscopy (02/2002) - 2 polyps noted, 1 - adenomatous, 2 -  hyperplastic // Followed by [name]  . Anxiety   . IBS (irritable bowel syndrome)   . Osteoporosis     DEXA (12/2004) - L-spine T score -2.6, Left hip -0.1  . Fibromyalgia   . Obstructive sleep apnea     on CPAP. // Sleep study (06/2009) - Moderate sleep apnea/ hypopnea syndrome , AHI 17.8 per hour with nonpositional hypopneas. CPAP titration to 12 CWP, AHI 2.4 per hour. Small resMed Quattro full-face mask with heated humidifier  . Restless leg syndrome   . Mitral stenosis     moderate per TEE (08/2010) // Mitral valve replacement with a 27-mm pericardial porcine valve (Medtronic Mosaic valve, serial #[id number]) on 09/20/2010 - [name] // Followed at [facility], Dr. Stanford Breed  . Bilateral carotid artery stenosis     s/p right endarterectomy (06/2010). // Carotid US (07/2010) -  60%-79% ICA stenosis, mid range of scale by velocity.  Left: Moderate-to-severe calcific and non-calcific plaque origin andproximal ICA and ECA.  60%-79% ICA stenosis, highest end of scale //  Followed by [name].     Past Surgical History  Procedure Date  . Orif clavicle fracture 01/2004    by Thana Farr. [name], M.D for Right clavicle nonunion.  . Tonsillectomy   . Carotid endarterectomy 07/04/2010    right, by [name] for asymptomatic right carotid artery stenosis  . Lipoma exision 08/2005    occipital lipoma 1.5cm - by Dr. [name]  . Hysteroscopy with endometrial ablation 06/2001    for persistent post-menopausal bleeding // by S. [name], M.D.  . Mitral valve replacement 09/20/10     with a 27-mm pericardial porcine valve (Medtronic Mosaic valve, serial #[id number]). 09/20/10, Dr Prescott Gum  . Left atrial maze procedure 09/20/10    for paroxysmal atrial fibrillation (Dr. Prescott Gum)  . Placement of left chest tube. 09/24/2010    Dr Lucianne Lei Kerby Less      Objective:   Physical Exam: BP 129/67  Pulse 85  Temp(Src) 96.8 F (36 C) (Oral)  Ht _0  (1.575 m)  Wt 174 lb 1.6 oz (78.971 kg)  BMI 31.84 kg/m2   General: Vital signs reviewed and noted. Well-developed, well-nourished, in no acute distress; alert, appropriate and cooperative throughout examination.  Head/ Neck: Normocephalic, atraumatic. No cervical, maxillary, or mandibular lymphadenopathy appreciated.  Nose: Nonerythematous, no significant mucosal edema.  Ears: TM nonerythematous, not bulging, canals without edema.  Throat: Mildly erythematous without tonsillar enlargement.  Lungs:  Normal respiratory effort. Clear to auscultation BL without crackles or wheezes. Decreased breath sounds diffusely.  Heart: RRR. S1 and S2 normal. (+) murmur.  Abdomen:  BS normoactive. Soft, Nondistended, non-tender.  No masses or organomegaly.  Extremities: Trace pretibial edema. Breast exam - no axillary lymphadenopathy, breast masses, skin discoloration or retractions. No nipple discharge.     Assessment & Plan:  Case and plan of care discussed with Dr. [name].

## 2011-01-23 NOTE — Patient Instructions (Signed)
Please follow-up at the clinic in 2-3 weeks with [facility] and in 5-6 weeks with me, at which time we will reevaluate your diabetes.  Increase you Lantus to 40 units, decrease to 37 if having blood sugars < 70 mg/dL.   Start taking Novolog 3 units before lunch.  You can use Miralax for constipation.  Please follow-up in the clinic sooner if needed.  If you have been started on new medication(s), and you develop symptoms concerning for allergic reaction, including, but not limited to, throat closing, tongue swelling, rash, please stop the medication immediately and call the clinic at [phone], and go to the ER.  If you are diabetic, please bring your meter to your next visit.  If symptoms worsen, or new symptoms arise, please call the clinic or go to the ER.  Please bring all of your medications in a bag to your next visit.

## 2011-01-29 MED ORDER — HYDROCODONE-ACETAMINOPHEN 5-325 MG PO TABS: ORAL_TABLET | ORAL | Status: DC

## 2011-01-29 MED ORDER — GLUCOSE BLOOD VI STRP: ORAL_STRIP | Status: DC

## 2011-01-29 NOTE — Telephone Encounter (Signed)
Norco rx refilled -request faxed to [facility].

## 2011-01-29 NOTE — Telephone Encounter (Signed)
I reviewed Dr Kirk Ruths and last fill date. Refill appropriate

## 2011-01-29 NOTE — Telephone Encounter (Signed)
I reviewed Dr Kirk Ruths and last fill date and last two office notes. I changed the FYI to reflect the change in mgmt that was made in Nov. Refill appropriate. Will provide refills as has appt in Jan and was just recently seen.

## 2011-01-29 NOTE — Telephone Encounter (Signed)
I reviewed Dr Kirk Ruths and last fill date. Refill appropriate. Will provide refills as has appt in Jan and was just recently seen.

## 2011-01-30 NOTE — Assessment & Plan Note (Signed)
Congratulated on her smoking cessation!!

## 2011-01-30 NOTE — Assessment & Plan Note (Addendum)
Labs: Lab Results  Component Value Date   HGBA1C 6.7 12/19/2010   HGBA1C 7.2* 09/18/2010   CREATININE 1.00 11/18/2010   CREATININE 0.90 07/24/2010   MICROALBUR 1.60 11/09/2009   MICRALBCREAT 13.9 11/09/2009   CHOL 131 08/03/2010   HDL 33* 08/03/2010   TRIG 196* 08/03/2010    Last eye exam and foot exam:    Component Value Date/Time   HMDIABEYEEXA no evidence of diabetic retinopathy - Dr. Katy Fitch 08/13/2010   HMDIABFOOTEX  negative 09/14/2009     Assessment:  Disease Control: controlled  Progress toward goals: improved  Barriers to meeting goals: frequently eats late night meals.   Plan: Glucometer log was reviewed today, as pt did have glucometer available for review. Blood sugars are throughout the day elevated in the 200-300s range.      Will escalate Lantus to 40 units, cut back to 37 again if having sugars < 70 mg/dL  There is some confusion regarding her meal-time insulin. Ms. Hoopingarner says she is only taking it before breakfast, and is not taking before lunch or dinner. She says her lunch meal is the largest.  Will recommend addition of 3 units Novolog before lunch too. Therefore, should be taking Novolog 3 units before breakfast and dinner, in addition to her Lantus 40 units.  Will recommend seeing Butch Penny Plyler in a few weeks for further adjustment of her insulin and for further diabetic diet counseling, and she can see me a few weeks after.

## 2011-02-03 ENCOUNTER — Encounter: Payer: Self-pay | Admitting: Internal Medicine

## 2011-02-03 DIAGNOSIS — K5909 Other constipation: Secondary | ICD-10-CM

## 2011-02-03 DIAGNOSIS — T402X5A Adverse effect of other opioids, initial encounter: Secondary | ICD-10-CM | POA: Insufficient documentation

## 2011-02-03 DIAGNOSIS — K5900 Constipation, unspecified: Secondary | ICD-10-CM | POA: Insufficient documentation

## 2011-02-03 DIAGNOSIS — N644 Mastodynia: Secondary | ICD-10-CM | POA: Insufficient documentation

## 2011-02-03 HISTORY — DX: Other constipation: K59.09

## 2011-02-03 NOTE — Assessment & Plan Note (Signed)
   Refill her chronic meds per her pain contract.  New pain contract signed for Norco 5-325 mg po QID PRN pain, # 240/month

## 2011-02-03 NOTE — Assessment & Plan Note (Signed)
   Recommended to take daily BID dosing of colace given her chronic narcotic use.

## 2011-02-03 NOTE — Assessment & Plan Note (Signed)
Unclear etiology, possibly a muscular pain after her thoracotomy for her mitral valve replacement. No breast nodules, masses appreciated on exam. No nipple discharge or retractions. She is scheduled already for mammogram in January 2013.  Recommended to proceed with mammogram, advised not to miss appointment.  Counseled on returning to clinic nipple discharge, masses, skin retractions, etc.

## 2011-02-03 NOTE — Assessment & Plan Note (Addendum)
BP Readings from Last 3 Encounters:  01/23/11 129/67  12/19/10 143/86  12/05/10 967/89    Basic Metabolic Panel:    Component Value Date/Time   NA 139 11/18/2010 0722   K 3.7 11/18/2010 0722   CL 99 11/18/2010 0722   CO2 29 11/18/2010 0722   BUN 23 11/18/2010 0722   CREATININE 1.00 11/18/2010 0722   CREATININE 0.90 07/24/2010 0926   GLUCOSE 152* 11/18/2010 0722   CALCIUM 9.5 11/18/2010 0722    Assessment: Hypertension Control: controlled  Progress toward goals: at goal  Barriers to meeting goals: no barriers identified   Plan:  continue current medications

## 2011-02-07 ENCOUNTER — Encounter: Payer: Self-pay | Admitting: Surgery

## 2011-02-10 ENCOUNTER — Encounter: Payer: Self-pay | Admitting: Surgery

## 2011-02-10 ENCOUNTER — Ambulatory Visit (INDEPENDENT_AMBULATORY_CARE_PROVIDER_SITE_OTHER): Payer: Medicare Other | Admitting: Vascular Surgery

## 2011-02-10 ENCOUNTER — Other Ambulatory Visit (INDEPENDENT_AMBULATORY_CARE_PROVIDER_SITE_OTHER): Payer: Medicare Other | Admitting: Vascular Surgery

## 2011-02-10 ENCOUNTER — Ambulatory Visit (INDEPENDENT_AMBULATORY_CARE_PROVIDER_SITE_OTHER): Payer: Medicare Other | Admitting: Surgery

## 2011-02-10 VITALS — BP 118/78 | HR 79 | Resp 16 | Ht 62.0 in | Wt 175.0 lb

## 2011-02-10 DIAGNOSIS — Z48812 Encounter for surgical aftercare following surgery on the circulatory system: Secondary | ICD-10-CM

## 2011-02-10 DIAGNOSIS — I70219 Atherosclerosis of native arteries of extremities with intermittent claudication, unspecified extremity: Secondary | ICD-10-CM

## 2011-02-10 DIAGNOSIS — I6529 Occlusion and stenosis of unspecified carotid artery: Secondary | ICD-10-CM

## 2011-02-10 NOTE — Progress Notes (Signed)
[facility]   Patient name: [name] MRN: [id number] DOB: 25-Feb-1950 Sex: female     Chief Complaint  Patient presents with  . Carotid    f/up with Labs ABI's & Carotid , pt states right neck swells and hurts X 4-6 weeks duration    HISTORY OF PRESENT ILLNESS: The patient is back today for followup. She is status post right carotid endarterectomy in May of 2012 for asymptomatic stenosis. Postoperatively she did have mild swallowing issues however a swallow study was normal in the hospital and these episodes resolved. In August she underwent valve replacement. Since that time she's required home oxygen. She has not yet started cardiac rehabilitation. She isn't having new issues with hoarseness and trouble swallowing she is seen in ENT physician and a barium swallow has been scheduled. This is new since her cardiac operation.  Past Medical History  Diagnosis Date  . Depression   . GERD (gastroesophageal reflux disease)     Followed by [name] // small bowel capsule endoscopy (11/2009) - minimal chronic inflammation, otherwise negative for  H.pylori, metaplasia, dysplasia  . Hypertension   . Hyperlipidemia   . Diabetes mellitus type II, uncontrolled     on insulin therapy  . COPD (chronic obstructive pulmonary disease) 2008    started home O2 (12/2010) // Prior significant smoker  // her CT of the chest there was no evidence of primary carcinoid tumor , scattered normal-sized mediastinal and hilar as well as axillary lymph nodes noted., no significant lymphadenopathy. COPD and emphysema with no acute cardiopulmonary disease noted , mild cardiomegaly and solitary calcified  plaque  . Moderate to severe pulmonary hypertension     severe per TEE (08/2010) - Peak RV-RA gradient 13m Hg  . Mitral regurgitation     mild to moderate per TEE (08/2010)  . Shingles   . Anemia     Anemia panel (08/2009) - iron 21, TIBC 441, UIBC 420,  ferritin 11 // Pt has  been unable to tolerate iron supplementation due to chronic abdominal discomfort, source of which still is not clear.  .Marland KitchenHistory of colonic polyps     Colonoscopy (10/2005) - colon polyp with path nonadenomatous, no malignancy - by Dr. [name] // Colonoscopy (02/2002) - 2 polyps noted, 1 - adenomatous, 2 - hyperplastic // Followed by Dr. [name] . Anxiety   . IBS (irritable bowel syndrome)   . Osteoporosis     DEXA (12/2004) - L-spine T score -2.6, Left hip -0.1  . Fibromyalgia   . Obstructive sleep apnea     on CPAP. // Sleep study (06/2009) - Moderate sleep apnea/ hypopnea syndrome , AHI 17.8 per hour with nonpositional hypopneas. CPAP titration to 12 CWP, AHI 2.4 per hour. Small resMed Quattro full-face mask with heated humidifier  . Restless leg syndrome   . Mitral stenosis     moderate per TEE (08/2010) // Mitral valve replacement with a 27-mm pericardial porcine valve (Medtronic Mosaic valve, serial ##[id number] on 09/20/2010 - PIvin Poot// [facility] [name] . Bilateral carotid artery stenosis     s/p right endarterectomy (06/2010). // Carotid UKorea(07/2010) -  60%-79% ICA stenosis, mid range of scale by velocity.  Left: Moderate-to-severe calcific and non-calcific plaque origin andproximal ICA and ECA.  60%-79% ICA stenosis, highest end of scale // Followed by [name]  . Diabetes mellitus   . Peripheral vascular disease     Past Surgical History  Procedure Date  . Orif clavicle fracture 01/2004    by Thana Farr. [name], M.D for Right clavicle nonunion.  . Tonsillectomy   . Carotid endarterectomy 07/04/2010    right, by [name] for asymptomatic right carotid artery stenosis  . Lipoma exision 08/2005    occipital lipoma 1.5cm - by Dr. [name]  . Hysteroscopy with endometrial ablation 06/2001    for persistent post-menopausal bleeding // by S. [name], M.D.  . Mitral valve replacement 09/20/10     with a 27-mm pericardial porcine valve (Medtronic  Mosaic valve, serial #[id number]). 09/20/10, Dr Prescott Gum  . Left atrial maze procedure 09/20/10    for paroxysmal atrial fibrillation (Dr. Prescott Gum)  . Placement of left chest tube. 09/24/2010    Dr Prescott Gum  . Pr vein bypass graft,aorto-fem-pop May 2012  . Cardiac valve replacement Aug. 2012    History   Social History  . Marital Status: Divorced    Spouse Name: N/A    Number of Children: N/A  . Years of Education: N/A   Occupational History  . Not on file.   Social History Main Topics  . Smoking status: Former Smoker -- 1.0 packs/day for 50 years    Types: Cigarettes    Quit date: 07/12/2010  . Smokeless tobacco: Not on file  . Alcohol Use: No  . Drug Use: No  . Sexually Active: No   Other Topics Concern  . Not on file   Social History Narrative  . No narrative on file    Family History  Problem Relation Age of Onset  . Heart disease Father 72    died to MI at 46yo  . Heart disease Brother 16    died of MI at 58yo    Allergies as of 02/10/2011 - Review Complete 02/10/2011  Allergen Reaction Noted  . Ativan  11/04/2010  . Morphine    . Percocet (oxycodone-acetaminophen)  08/12/2010  . Tramadol hcl      Current Outpatient Prescriptions on File Prior to Visit  Medication Sig Dispense Refill  . albuterol (PROVENTIL,VENTOLIN) 90 MCG/ACT inhaler Inhale 1-2 puffs into the lungs every 4 (four) hours as needed.  17 g  11  . ALPRAZolam (XANAX) 1 MG tablet Take 1 tablet (1 mg total) by mouth 3 (three) times daily as needed for sleep or anxiety.  90 tablet  3  . amiodarone (PACERONE) 200 MG tablet Take 1 tablet (200 mg total) by mouth daily.  30 tablet  12  . aspirin 81 MG tablet Take 81 mg by mouth daily.        Marland Kitchen buPROPion (WELLBUTRIN XL) 150 MG 24 hr tablet Take 1 tablet (150 mg total) by mouth daily.  30 tablet  0  . docusate sodium (COLACE) 100 MG capsule Take 1 capsule (100 mg total) by mouth 2 (two) times daily.  60 capsule  7  . DULoxetine (CYMBALTA) 30 MG  capsule Take 3 tablets by mouth once daily  90 capsule  7  . Fluticasone-Salmeterol (ADVAIR DISKUS) 250-50 MCG/DOSE AEPB Inhale 1 puff into the lungs every 12 (twelve) hours.  1 each  11  . furosemide (LASIX) 80 MG tablet Take 1 tablet (80 mg total) by mouth 2 (two) times daily.  60 tablet  12  . gabapentin (NEURONTIN) 300 MG capsule Take 1 capsule (300 mg total) by mouth 2 (two) times daily.  60 capsule  2  . glucose blood test strip Use to test blood sugar 3 times  daily.  100 each  11  . HYDROcodone-acetaminophen (NORCO) 5-325 MG per tablet 1-2 tablets every 6 hours as needed for pain. Do not take with alcohol, do no drive or operate heavy machinery if drowsiness occurs. Do not take more than instructed.  240 tablet  3  . insulin aspart (NOVOLOG) 100 UNIT/ML injection 3 units to cover 3 carb choices each meal 2x a day Before breakfast and before lunch). Add correction amount of Novolog to food Novolog if needed up to a total of 30 units per day: If blood sugar is: < 150= no correction, if 151-200 add 1 unit, if 201-250 add 2 units, if 251-300 add 3 units, if 301-350 add 4 units, if 351-400 add 5 units, if 401- 450 add 6 units, if > 450 please call office for instructions.  10 mL  11  . insulin glargine (LANTUS) 100 UNIT/ML injection Inject 40 Units into the skin at bedtime.  10 mL    . Insulin Syringe-Needle U-100 (INSULIN SYRINGE .3CC/31GX5/16") 31G X 5/16" 0.3 ML MISC 1 Syringe by Does not apply route 2 (two) times daily at 10 AM and 5 PM.  100 each  11  . Insulin Syringe-Needle U-100 (INSULIN SYRINGE 1CC/30GX1/2") 30G X 1/2" 1 ML MISC 1 Container by Does not apply route 2 (two) times daily.  100 each  11  . loratadine (CLARITIN) 10 MG tablet Take 1 tablet (10 mg total) by mouth daily.  30 tablet  7  . omeprazole (PRILOSEC) 40 MG capsule Take 40 mg by mouth 2 (two) times daily.       . potassium chloride SA (K-DUR,KLOR-CON) 20 MEQ tablet Take 1 tablet (20 mEq total) by mouth 3 (three) times daily.   90 tablet  12  . rosuvastatin (CRESTOR) 20 MG tablet Take 1 tablet (20 mg total) by mouth daily.  30 tablet  7  . tiotropium (SPIRIVA) 18 MCG inhalation capsule Place 1 capsule (18 mcg total) into inhaler and inhale daily.  30 capsule  7  . warfarin (COUMADIN) 2 MG tablet Take as directed by anticoagulation clinic  35 tablet  3     REVIEW OF SYSTEMS: Cardiovascular: No chest pain, chest pressure, palpitations, orthopnea, or dyspnea on exertion. No claudication or rest pain,  No history of DVT or phlebitis. Pulmonary: No productive cough, asthma or wheezing. Neurologic: No weakness, paresthesias, aphasia, or amaurosis. No dizziness. Hematologic: No bleeding problems or clotting disorders. Musculoskeletal: No joint pain or joint swelling. Gastrointestinal: No blood in stool or hematemesis Genitourinary: No dysuria or hematuria. Psychiatric:: No history of major depression. Integumentary: No rashes or ulcers. Constitutional: No fever or chills.  PHYSICAL EXAMINATION:   Vital signs are BP 118/78  Pulse 79  Resp 16  Ht _0  (1.575 m)  Wt 175 lb (79.379 kg)  BMI 32.01 kg/m2  SpO2 94% General: The patient appears their stated age. HEENT:  No gross abnormalities Pulmonary:  Non labored breathing Musculoskeletal: There are no major deformities. Neurologic: No focal weakness or paresthesias are detected, Skin: There are no ulcer or rashes noted. Psychiatric: The patient has normal affect. Cardiovascular: There is a regular rate and rhythm without significant murmur appreciated. Carotid incision is well-healed pedal pulses are not palpable she does have trace edema bilaterally   Diagnostic Studies Carotid ultrasound shows hyperplasia within the right carotid endarterectomy site with stenosis and a 40-59% range. The left internal carotid stenosis is stable in the 60-79% range  Assessment: Carotid stenosis status post right carotid endarterectomy  Plan: The patient complains of severe  hypersensitivity of her incision specifically at night hopefully this will resolve with time and is likely related to nerve regeneration. She does have elevated velocities in endarterectomy site at this early point this most likely represents hyperplasia. We'll continue to follow this with surveillance ultrasound. I am scheduling her to come back in 6 months with a repeat carotid ultrasound. She was placed on her ultrasound protocol. She is cleared for cardiac rehabilitation.  Eldridge Abrahams, M.D. [facility] [place] Office: [phone] Pager:  [phone]

## 2011-02-11 DIAGNOSIS — Z8669 Personal history of other diseases of the nervous system and sense organs: Secondary | ICD-10-CM

## 2011-02-11 HISTORY — DX: Personal history of other diseases of the nervous system and sense organs: Z86.69

## 2011-02-13 ENCOUNTER — Encounter (HOSPITAL_COMMUNITY): Payer: Self-pay | Admitting: Emergency Medicine

## 2011-02-13 ENCOUNTER — Emergency Department (INDEPENDENT_AMBULATORY_CARE_PROVIDER_SITE_OTHER)
Admission: EM | Admit: 2011-02-13 | Discharge: 2011-02-13 | Disposition: A | Discharge status: Home / Self Care | Payer: Medicare Other | Source: Home / Self Care | Attending: Family Medicine | Admitting: Family Medicine

## 2011-02-13 DIAGNOSIS — J019 Acute sinusitis, unspecified: Secondary | ICD-10-CM

## 2011-02-13 MED ORDER — AMOXICILLIN-POT CLAVULANATE 875-125 MG PO TABS: 1.0000 | ORAL_TABLET | Freq: Two times a day (BID) | ORAL | Status: AC

## 2011-02-13 NOTE — Discharge Instructions (Signed)
Drink plenty of fluids as discussed, use medicine as prescribed, and mucinex or delsym for cough. Return or see your doctor if further problems °

## 2011-02-13 NOTE — ED Provider Notes (Signed)
History     CSN: [id number]  Arrival date & time 02/13/11  1634   First MD Initiated Contact with Patient 02/13/11 1652      Chief Complaint  Patient presents with  . Facial Pain    (Consider location/radiation/quality/duration/timing/severity/associated sxs/prior treatment) Patient is a 61 y.o. female presenting with URI. The history is provided by the patient.  URI The primary symptoms include sore throat. Primary symptoms do not include fever, cough, nausea or vomiting. The current episode started more than 1 week ago. This is a new problem. The problem has been gradually worsening.  The onset of the illness is associated with exposure to sick contacts. Symptoms associated with the illness include facial pain, sinus pressure, congestion and rhinorrhea.    Past Medical History  Diagnosis Date  . Depression   . GERD (gastroesophageal reflux disease)     Followed by [name] // small bowel capsule endoscopy (11/2009) - minimal chronic inflammation, otherwise negative for  H.pylori, metaplasia, dysplasia  . Hypertension   . Hyperlipidemia   . Diabetes mellitus type II, uncontrolled     on insulin therapy  . COPD (chronic obstructive pulmonary disease) 2008    started home O2 (12/2010) // Prior significant smoker  // her CT of the chest there was no evidence of primary carcinoid tumor , scattered normal-sized mediastinal and hilar as well as axillary lymph nodes noted., no significant lymphadenopathy. COPD and emphysema with no acute cardiopulmonary disease noted , mild cardiomegaly and solitary calcified  plaque  . Moderate to severe pulmonary hypertension     severe per TEE (08/2010) - Peak RV-RA gradient 60m Hg  . Mitral regurgitation     mild to moderate per TEE (08/2010)  . Shingles   . Anemia     Anemia panel (08/2009) - iron 21, TIBC 441, UIBC 420,  ferritin 11 // Pt has been unable to tolerate iron supplementation due to chronic abdominal discomfort, source of which still  is not clear.  .Marland KitchenHistory of colonic polyps     Colonoscopy (10/2005) - colon polyp with path nonadenomatous, no malignancy - by Dr. [name] // Colonoscopy (02/2002) - 2 polyps noted, 1 - adenomatous, 2 - hyperplastic // Followed by Dr. [name] . Anxiety   . IBS (irritable bowel syndrome)   . Osteoporosis     DEXA (12/2004) - L-spine T score -2.6, Left hip -0.1  . Fibromyalgia   . Obstructive sleep apnea     on CPAP. // Sleep study (06/2009) - Moderate sleep apnea/ hypopnea syndrome , AHI 17.8 per hour with nonpositional hypopneas. CPAP titration to 12 CWP, AHI 2.4 per hour. Small resMed Quattro full-face mask with heated humidifier  . Restless leg syndrome   . Mitral stenosis     moderate per TEE (08/2010) // Mitral valve replacement with a 27-mm pericardial porcine valve (Medtronic Mosaic valve, serial ##[id number] on 09/20/2010 - PIvin Poot// [name] . Bilateral carotid artery stenosis     s/p right endarterectomy (06/2010). // Carotid UKorea(07/2010) -  60%-79% ICA stenosis, mid range of scale by velocity.  Left: Moderate-to-severe calcific and non-calcific plaque origin andproximal ICA and ECA.  60%-79% ICA stenosis, highest end of scale // Followed by [name]  . Diabetes mellitus   . Peripheral vascular disease     Past Surgical History  Procedure Date  . Orif clavicle fracture 01/2004    by MThana Farr YLorin Mercy M.D for Right clavicle nonunion.  .Marland Kitchen  Tonsillectomy   . Carotid endarterectomy 07/04/2010    right, by [name] for asymptomatic right carotid artery stenosis  . Lipoma exision 08/2005    occipital lipoma 1.5cm - by Dr. [name]  . Hysteroscopy with endometrial ablation 06/2001    for persistent post-menopausal bleeding // by S. [name], M.D.  . Mitral valve replacement 09/20/10     with a 27-mm pericardial porcine valve (Medtronic Mosaic valve, serial #[id number]). 09/20/10, Dr Prescott Gum  . Left atrial maze procedure 09/20/10    for  paroxysmal atrial fibrillation (Dr. Prescott Gum)  . Placement of left chest tube. 09/24/2010    Dr Prescott Gum  . Pr vein bypass graft,aorto-fem-pop May 2012  . Cardiac valve replacement Aug. 2012    Family History  Problem Relation Age of Onset  . Heart disease Father 72    died to MI at 31yo  . Heart disease Brother 76    died of MI at 33yo    History  Substance Use Topics  . Smoking status: Former Smoker -- 1.0 packs/day for 50 years    Types: Cigarettes    Quit date: 07/12/2010  . Smokeless tobacco: Not on file  . Alcohol Use: No    OB History    Grav Para Term Preterm Abortions TAB SAB Ect Mult Living                  Review of Systems  Constitutional: Negative for fever.  HENT: Positive for congestion, sore throat, rhinorrhea, postnasal drip and sinus pressure.   Respiratory: Negative for cough.   Gastrointestinal: Negative for nausea and vomiting.    Allergies  Ativan; Morphine; Percocet; and Tramadol hcl  Home Medications   Current Outpatient Rx  Name Route Sig Dispense Refill  . ALBUTEROL 90 MCG/ACT IN AERS Inhalation Inhale 1-2 puffs into the lungs every 4 (four) hours as needed. 17 g 11  . ALPRAZOLAM 1 MG PO TABS Oral Take 1 tablet (1 mg total) by mouth 3 (three) times daily as needed for sleep or anxiety. 90 tablet 3  . AMIODARONE HCL 200 MG PO TABS Oral Take 1 tablet (200 mg total) by mouth daily. 30 tablet 12  . AMOXICILLIN-POT CLAVULANATE 875-125 MG PO TABS Oral Take 1 tablet by mouth 2 (two) times daily. 20 tablet 0  . ASPIRIN 81 MG PO TABS Oral Take 81 mg by mouth daily.      . BUPROPION HCL ER (XL) 150 MG PO TB24 Oral Take 1 tablet (150 mg total) by mouth daily. 30 tablet 0  . DOCUSATE SODIUM 100 MG PO CAPS Oral Take 1 capsule (100 mg total) by mouth 2 (two) times daily. 60 capsule 7  . DULOXETINE HCL 30 MG PO CPEP  Take 3 tablets by mouth once daily 90 capsule 7  . FLUTICASONE-SALMETEROL 250-50 MCG/DOSE IN AEPB Inhalation Inhale 1 puff into the lungs  every 12 (twelve) hours. 1 each 11  . FUROSEMIDE 80 MG PO TABS Oral Take 1 tablet (80 mg total) by mouth 2 (two) times daily. 60 tablet 12  . GABAPENTIN 300 MG PO CAPS Oral Take 1 capsule (300 mg total) by mouth 2 (two) times daily. 60 capsule 2  . GLUCOSE BLOOD VI STRP  Use to test blood sugar 3 times daily. 100 each 11    One Touch Ultra test strips  . HYDROCODONE-ACETAMINOPHEN 5-325 MG PO TABS  1-2 tablets every 6 hours as needed for pain. Do not take with alcohol, do no  drive or operate heavy machinery if drowsiness occurs. Do not take more than instructed. 240 tablet 3  . INSULIN ASPART 100 UNIT/ML [place] SOLN  3 units to cover 3 carb choices each meal 2x a day Before breakfast and before lunch). Add correction amount of Novolog to food Novolog if needed up to a total of 30 units per day: If blood sugar is: < 150= no correction, if 151-200 add 1 unit, if 201-250 add 2 units, if 251-300 add 3 units, if 301-350 add 4 units, if 351-400 add 5 units, if 401- 450 add 6 units, if > 450 please call office for instructions. 10 mL 11    Dispense as written.  . INSULIN GLARGINE 100 UNIT/ML [place] SOLN Subcutaneous Inject 40 Units into the skin at bedtime. 10 mL   . INSULIN SYRINGE 31G X 5/16" 0.3 ML MISC Does not apply 1 Syringe by Does not apply route 2 (two) times daily at 10 AM and 5 PM. 100 each 11  . INSULIN SYRINGE 30G X 1/2" 1 ML MISC Does not apply 1 Container by Does not apply route 2 (two) times daily. 100 each 11  . LORATADINE 10 MG PO TABS Oral Take 1 tablet (10 mg total) by mouth daily. 30 tablet 7  . OMEPRAZOLE 40 MG PO CPDR Oral Take 40 mg by mouth 2 (two) times daily.     Marland Kitchen POTASSIUM CHLORIDE CRYS ER 20 MEQ PO TBCR Oral Take 1 tablet (20 mEq total) by mouth 3 (three) times daily. 90 tablet 12  . ROSUVASTATIN CALCIUM 20 MG PO TABS Oral Take 1 tablet (20 mg total) by mouth daily. 30 tablet 7  . TIOTROPIUM BROMIDE MONOHYDRATE 18 MCG IN CAPS Inhalation Place 1 capsule (18 mcg total) into inhaler and  inhale daily. 30 capsule 7  . WARFARIN SODIUM 2 MG PO TABS  Take as directed by anticoagulation clinic 35 tablet 3    BP 126/59  Pulse 80  Temp(Src) 98.1 F (36.7 C) (Oral)  Resp 18  SpO2 99%  Physical Exam  Nursing note and vitals reviewed. Constitutional: She appears well-developed and well-nourished.  HENT:  Head: Normocephalic.  Right Ear: External ear normal.  Left Ear: External ear normal.  Mouth/Throat: Posterior oropharyngeal erythema present.      ED Course  Procedures (including critical care time)  Labs Reviewed - No data to display No results found.   1. Sinusitis, acute       MDM          Pauline Good, MD 02/13/11 [phone]

## 2011-02-13 NOTE — ED Notes (Signed)
C/o sinus congestion for 2 weeks.  Runny nose, facial pain. Unknown if fever

## 2011-02-17 ENCOUNTER — Ambulatory Visit (INDEPENDENT_AMBULATORY_CARE_PROVIDER_SITE_OTHER): Payer: Medicare Other | Admitting: *Deleted

## 2011-02-17 DIAGNOSIS — I4891 Unspecified atrial fibrillation: Secondary | ICD-10-CM

## 2011-02-17 LAB — POCT INR: INR: 2.7

## 2011-02-19 ENCOUNTER — Other Ambulatory Visit: Payer: Self-pay | Admitting: Cardiology

## 2011-02-20 ENCOUNTER — Ambulatory Visit (HOSPITAL_COMMUNITY): Payer: Medicare Other

## 2011-02-20 ENCOUNTER — Other Ambulatory Visit: Payer: Self-pay | Admitting: *Deleted

## 2011-02-20 MED ORDER — INSULIN GLARGINE 100 UNIT/ML ~~LOC~~ SOLN: 40.0000 [IU] | Freq: Every day | SUBCUTANEOUS | Status: DC

## 2011-02-21 ENCOUNTER — Other Ambulatory Visit: Payer: Self-pay | Admitting: *Deleted

## 2011-02-21 DIAGNOSIS — E1142 Type 2 diabetes mellitus with diabetic polyneuropathy: Secondary | ICD-10-CM

## 2011-02-21 NOTE — Telephone Encounter (Signed)
No qty nor # refills on last refill request  Thanks

## 2011-02-24 ENCOUNTER — Ambulatory Visit (HOSPITAL_COMMUNITY): Payer: Medicare Other

## 2011-02-24 MED ORDER — GABAPENTIN 300 MG PO CAPS: 300.0000 mg | ORAL_CAPSULE | Freq: Two times a day (BID) | ORAL | Status: DC

## 2011-02-24 MED ORDER — INSULIN GLARGINE 100 UNIT/ML ~~LOC~~ SOLN: 40.0000 [IU] | Freq: Every day | SUBCUTANEOUS | Status: DC

## 2011-02-24 NOTE — Telephone Encounter (Signed)
Lantus insulin rx called to [facility].

## 2011-02-25 ENCOUNTER — Other Ambulatory Visit: Payer: Self-pay | Admitting: Dietician

## 2011-02-25 ENCOUNTER — Ambulatory Visit (INDEPENDENT_AMBULATORY_CARE_PROVIDER_SITE_OTHER): Payer: Medicare Other | Admitting: Dietician

## 2011-02-25 DIAGNOSIS — E119 Type 2 diabetes mellitus without complications: Secondary | ICD-10-CM

## 2011-02-25 NOTE — Telephone Encounter (Signed)
Patient complaining of burning and pain with Lantus insulin injections.   Request doctor consider  insulin pen with change to Levemir flexpen to eliminate burning and allow patient to use shorter and thinner pen needles.  Recommend: Levemir flexpen at same dose as lantus and pen needles 32 gauge x 4 mm  Per patient: if approved new prescription needs to go to [facility].

## 2011-02-25 NOTE — Patient Instructions (Addendum)
If okay with doctor:  Please increase your Novolog to 6 units before each meal that you eat 2- 3 meals a day hopefully- each meal should contain 50-60 grams carb  I will request the following:  1- change from lantus to levemir pen- same dose 2- insulin pen needles that are shorter and thinner 3- A Accu-chek fast click lancing device and lancet 4- change form 3 units Novolog to 6 units Novolog with each meal.  Let's follow-up  to review the above changes in 2-4 weeks

## 2011-02-25 NOTE — Telephone Encounter (Signed)
Also would like prescription for different lancing device and lancets to use with One touch meter for 3x/day testing.  Accu-chek Fast Click lancing device and lancets  . This prescription also needs to be sent to [place].

## 2011-02-25 NOTE — Progress Notes (Signed)
Medical Nutrition Therapy:  Appt start time: 1000 end time:  1100.  Assessment:  Primary concerns today: Blood sugar control Usual eating pattern includes Meal 2 and 2+ snacks per day.    Hates taking lantus- would like to change to pens. Complains of high blood sugar, denies any signs and symptoms of low blood sugars. Last A1C w on 12/19/10 was 6.7%. Recalculated insulin needs: 0.5 units/kg to 1.2 units/kg is 40-94 units/day. Patient currently taking 40 units lantus and 3-9 units Novolog each day for an actual total of 43-49 units each day. ICR is 1:8 and Correction 1: 30.  Does not use the correction Novolog scale.  Meter download reveals lowest blood sugar of 162, average of 231 and 96% above target   for past month with only about 1 check per day on average.  Patient feels she may do better if she had less pain with injections and lancing to check blood sugars.   Progress Towards Goal(s):  In progress   Nutritional Diagnosis:  NB-1.1 Food and nutrition-related knowledge deficit As related to not knowing how to dose Novolog insulin to correct high blood sugar.  As evidenced by her report of eating more than 3 carbs at most meals , verbalizing knowledge that 3 units only enough insulin for 3 carb choices and elevated blood sugars resolved as patient now verbalizes she understands and is not using.   NB 1.4 self monitoring deficit as related to pain associated with insulin injection and self monitoring as evidenced by her report of "Tired of sticking herself and pain".      Interventions: 1- Reveiwed action of rapid acting insulin and carb amounts of meal. Recalculated insulin to carb ratio is 1:8- patient meals with ~ 50-60 gram carb so likely needs increased amount of prandail insulin.  2- Education about less painful insulins, lancing devices. 3- Coordination of care- request increase in Novolog to 6 units with meals, considerr change to basal insulin that does not burn- Levemir, insulin pen  instead of syringes so she can use shorter pen needles and new lancing device and lancets.    Monitoring/Evaluation:  Dietary intake, blood sugars and insulin amounts in 2 week(s)

## 2011-02-26 ENCOUNTER — Ambulatory Visit (HOSPITAL_COMMUNITY): Payer: Medicare Other

## 2011-02-26 NOTE — Progress Notes (Signed)
Discussed change in Novolog meal time dose with Dr. Felix Pacini Doy Mince who agreed with change.

## 2011-02-28 ENCOUNTER — Telehealth: Payer: Self-pay | Admitting: Dietician

## 2011-02-28 ENCOUNTER — Ambulatory Visit (INDEPENDENT_AMBULATORY_CARE_PROVIDER_SITE_OTHER): Payer: Medicare Other | Admitting: Internal Medicine

## 2011-02-28 ENCOUNTER — Ambulatory Visit (HOSPITAL_COMMUNITY): Payer: Medicare Other

## 2011-02-28 DIAGNOSIS — IMO0001 Reserved for inherently not codable concepts without codable children: Secondary | ICD-10-CM

## 2011-02-28 MED ORDER — ACCU-CHEK FASTCLIX LANCET KIT: 1.0000 | PACK | Freq: Three times a day (TID) | Status: DC

## 2011-02-28 MED ORDER — INSULIN PEN NEEDLE 31G X 5 MM MISC: Status: DC

## 2011-02-28 MED ORDER — ACCU-CHEK FASTCLIX LANCETS MISC: 1.0000 | Freq: Three times a day (TID) | Status: DC

## 2011-02-28 MED ORDER — INSULIN DETEMIR 100 UNIT/ML ~~LOC~~ SOLN: 40.0000 [IU] | Freq: Every day | SUBCUTANEOUS | Status: DC

## 2011-02-28 NOTE — Progress Notes (Signed)
Pt had seen Butch Penny Plyler at which time she indicated burning sensation when administering her Lantus. Butch Penny suggested change to Levemir which is less acidic to perhaps reduce this sensation, promote improved compliance. I am agreeable to this. Pt will need to continue to monitor for signs of hypoglycemia/ hyperglycemia, and will need to follow-up soon with either Butch Penny or myself for reevaluation of blood sugar control on this new insulin regimen.  [name], D.O.

## 2011-02-28 NOTE — Telephone Encounter (Signed)
Called patient who sends regret for not being able to see Dr. Guy Sandifer today. She verbalized understanding to needing to watch for signs and symptoms of high and low blood sugar when she changes her insulin dn to check blood sugar more frequently. She has an appointment with CDE 03/11/11 and was transferred to front office to reschedule her appointment with Dr. Guy Sandifer.

## 2011-02-28 NOTE — Telephone Encounter (Signed)
Can you please call the pt and let her know of this change, and to be aware of hypoglycemia/ hyperglycemia. She should also follow-up with either you or myself within next 2 weeks so we can reevaluate and make sure her regimen is appropriate/ adjust as necessary. Please inform her of this. Thank you for your help.  Sincerely, Darrick Penna

## 2011-03-03 ENCOUNTER — Ambulatory Visit (HOSPITAL_COMMUNITY): Payer: Medicare Other

## 2011-03-03 ENCOUNTER — Other Ambulatory Visit: Payer: Self-pay | Admitting: Vascular Surgery

## 2011-03-03 DIAGNOSIS — I6529 Occlusion and stenosis of unspecified carotid artery: Secondary | ICD-10-CM

## 2011-03-03 NOTE — Procedures (Unsigned)
CAROTID DUPLEX EXAM  INDICATION:  Carotid stenosis.  HISTORY: Diabetes:  Yes. Cardiac:  Valve replacement in August 2012. Hypertension:  No. Smoking:  Previous. Previous Surgery:  Right carotid endarterectomy on 07/04/2010. CV History:  Asymptomatic. Amaurosis [place] No, Paresthesias No, Hemiparesis No.                                      RIGHT             LEFT Brachial systolic pressure:         111               118 Brachial Doppler waveforms:         WNL               WNL Vertebral direction of flow:        Antegrade         Antegrade DUPLEX VELOCITIES (cm/sec) CCA peak systolic                   102               017 ECA peak systolic                   357               510 ICA peak systolic                   204               258 ICA end diastolic                   59                70 PLAQUE MORPHOLOGY:                  Heterogenous      Heterogenous PLAQUE AMOUNT:                      Moderate          Moderate to severe PLAQUE LOCATION:                    CCA/ICA/ECA       CCA/ICA/ECA  IMPRESSION: 1. Right internal carotid artery is patent with history of     endarterectomy; however, elevated velocities suggestive of stenosis     versus hyperplasia in the 40% to 59% range (high end of range). 2. Bilateral external carotid artery stenosis present. 3. Left internal carotid artery stenosis in the 60% to 79% range. 4.  Bilateral vertebral arteries are patent and antegrade.       ___________________________________________ V. Leia Alf, MD  SH/MEDQ  D:  02/10/2011  T:  02/10/2011  Job:  [id number]

## 2011-03-05 ENCOUNTER — Ambulatory Visit (HOSPITAL_COMMUNITY): Payer: Medicare Other

## 2011-03-06 ENCOUNTER — Telehealth: Payer: Self-pay | Admitting: Dietician

## 2011-03-06 NOTE — Telephone Encounter (Signed)
Will get pt in for eval.

## 2011-03-06 NOTE — Telephone Encounter (Signed)
Called patient and informed her of appointment tomorrow at 9:45 am. She's going to try to get transportation and will call if she is unable to be here.  Encouraged her to continue to check blood sugar every 2 hours  and drink plenty of water.

## 2011-03-06 NOTE — Telephone Encounter (Signed)
Blood sugars very high: 545 a little while ago when she tested before lunch, sugars have been 272 at 9 am today,265 and 393 yesterday  Eating normal, no other change in medicine; bowl of cereal and applesauce for breakfast, took 6 units novolog, took nap- ate fiberone bar and now blood sugar 545- only symptom is hunger, has headache- but thinks it is from not sleeping last night, has dry mouth- is normal for her.   Caffeine free drinks all day- has had 3 16-20 ounce cups. Repeat cbg: 501  Advised her to take 12 units of new vial of Novolog (her vial was > 3 month old)  per per correction dose( 6 for luncha nd 6 for high blood sugar, eat lunch lower carb lunch , then recheck in 2 hours. Drink plenty of water. CDE will call her before leaving office today  [place] also asks [place] to look into why she Did not get allergy medicine this month. Will request triage help her with this.

## 2011-03-06 NOTE — Telephone Encounter (Signed)
Called patient: her blood sugar now: 427,asked pateint about fever, signs of infection: got cold/chills a little bit ago, but thinks it is because she was drinking cold drinks. Had urge to urinate but only a little urine produced in the past few hours, but normal urine this am. had a little bit of blood when wiped a few days ago, but normal BMs x 2 since, had sinus infection a few weeks ago, went to urgetn care for this, finished antibiotic and has no symptoms currently.    reports that she was well controlled in hospital on 30 units lantus and a few units Novolog each meal.   Will Discuss with attending and call patient back.

## 2011-03-07 ENCOUNTER — Telehealth: Payer: Self-pay | Admitting: Dietician

## 2011-03-07 ENCOUNTER — Encounter: Payer: Medicare Other | Admitting: Internal Medicine

## 2011-03-07 ENCOUNTER — Ambulatory Visit (HOSPITAL_COMMUNITY): Payer: Medicare Other

## 2011-03-07 NOTE — Telephone Encounter (Signed)
Patient left message that she could not get transportation to her appointment today and that her blood sugar at 10:41 Pm last night was down to 237m/dl.

## 2011-03-07 NOTE — Telephone Encounter (Signed)
Still has headache, but otherwise feels fine. Blood sugar this am was 208. Has appointment next  week with CDE, and her PCP on 03/14/11  Blood sugars yesterday:275- [phone] (took 10 more novolog and ate dinner) - 209, (took levemir), then 224 at 250 am.   Encouraged her to think about being seen by doctor on same day she sees CDE but patient reports she'll wait to see PCP. Also reminded patient to use correction and check blood sugars frequently to prevent hypo and hyperglycemia.   Suggest increase in testing supplies and frequency of blood sugar tests to  x daily.

## 2011-03-08 ENCOUNTER — Encounter (HOSPITAL_COMMUNITY): Payer: Self-pay | Admitting: *Deleted

## 2011-03-08 ENCOUNTER — Emergency Department (INDEPENDENT_AMBULATORY_CARE_PROVIDER_SITE_OTHER)
Admission: EM | Admit: 2011-03-08 | Discharge: 2011-03-08 | Disposition: A | Discharge status: Home / Self Care | Payer: Medicare Other | Source: Home / Self Care | Attending: Family Medicine | Admitting: Family Medicine

## 2011-03-08 DIAGNOSIS — J329 Chronic sinusitis, unspecified: Secondary | ICD-10-CM

## 2011-03-08 MED ORDER — AMOXICILLIN-POT CLAVULANATE 875-125 MG PO TABS: 1.0000 | ORAL_TABLET | Freq: Two times a day (BID) | ORAL | Status: AC

## 2011-03-08 NOTE — ED Provider Notes (Signed)
History     CSN: [id number]  Arrival date & time 03/08/11  1726   First MD Initiated Contact with Patient 03/08/11 1746      Chief Complaint  Patient presents with  . Nasal Congestion  . Cough    (Consider location/radiation/quality/duration/timing/severity/associated sxs/prior treatment) HPI Comments: [name] presents for evaluation of persistent sinus pressure, pain, and nasal congestion. She reports a similar episode several weeks ago where she was given abx that helped. She reports a hx of allergies and takes loratadine nightly, and also irrigates her nasal passages. She denies any fever. She quit smoking last August.   Patient is a 61 y.o. female presenting with sinusitis. The history is provided by the patient.  Sinusitis  This is a new problem. The current episode started more than 2 days ago. The problem has not changed since onset.There has been no fever. The pain is moderate. The pain has been constant since onset. Associated symptoms include congestion and sinus pressure. Pertinent negatives include no cough.    Past Medical History  Diagnosis Date  . Depression   . GERD (gastroesophageal reflux disease)     Followed by [name] // small bowel capsule endoscopy (11/2009) - minimal chronic inflammation, otherwise negative for  H.pylori, metaplasia, dysplasia  . Hypertension   . Hyperlipidemia   . Diabetes mellitus type II, uncontrolled     on insulin therapy  . COPD (chronic obstructive pulmonary disease) 2008    started home O2 (12/2010) // Prior significant smoker  // her CT of the chest there was no evidence of primary carcinoid tumor , scattered normal-sized mediastinal and hilar as well as axillary lymph nodes noted., no significant lymphadenopathy. COPD and emphysema with no acute cardiopulmonary disease noted , mild cardiomegaly and solitary calcified  plaque  . Moderate to severe pulmonary hypertension     severe per TEE (08/2010) - Peak RV-RA gradient 72m Hg  .  Mitral regurgitation     mild to moderate per TEE (08/2010)  . Shingles   . Anemia     Anemia panel (08/2009) - iron 21, TIBC 441, UIBC 420,  ferritin 11 // Pt has been unable to tolerate iron supplementation due to chronic abdominal discomfort, source of which still is not clear.  .Marland KitchenHistory of colonic polyps     Colonoscopy (10/2005) - colon polyp with path nonadenomatous, no malignancy - by Dr. [name] // Colonoscopy (02/2002) - 2 polyps noted, 1 - adenomatous, 2 - hyperplastic // Followed by Dr. [name] . Anxiety   . IBS (irritable bowel syndrome)   . Osteoporosis     DEXA (12/2004) - L-spine T score -2.6, Left hip -0.1  . Fibromyalgia   . Obstructive sleep apnea     on CPAP. // Sleep study (06/2009) - Moderate sleep apnea/ hypopnea syndrome , AHI 17.8 per hour with nonpositional hypopneas. CPAP titration to 12 CWP, AHI 2.4 per hour. Small resMed Quattro full-face mask with heated humidifier  . Restless leg syndrome   . Mitral stenosis     moderate per TEE (08/2010) // Mitral valve replacement with a 27-mm pericardial porcine valve (Medtronic Mosaic valve, serial ##[id number] on 09/20/2010 - PIvin Poot// [name] . Bilateral carotid artery stenosis     s/p right endarterectomy (06/2010). // Carotid [place](07/2010) -  60%-79% ICA stenosis, mid range of scale by velocity.  Left: Moderate-to-severe calcific and non-calcific plaque origin andproximal ICA and ECA.  60%-79% ICA stenosis, highest end  of scale // Followed by [name].  . Diabetes mellitus   . Peripheral vascular disease     Past Surgical History  Procedure Date  . Orif clavicle fracture 01/2004    by Thana Farr. [name], M.D for Right clavicle nonunion.  . Tonsillectomy   . Carotid endarterectomy 07/04/2010    right, by [name] for asymptomatic right carotid artery stenosis  . Lipoma exision 08/2005    occipital lipoma 1.5cm - by Dr. [name]  . Hysteroscopy with endometrial ablation  06/2001    for persistent post-menopausal bleeding // by S. [name], M.D.  . Mitral valve replacement 09/20/10     with a 27-mm pericardial porcine valve (Medtronic Mosaic valve, serial #[id number]). 09/20/10, Dr Prescott Gum  . Left atrial maze procedure 09/20/10    for paroxysmal atrial fibrillation (Dr. Prescott Gum)  . Placement of left chest tube. 09/24/2010    Dr Prescott Gum  . Pr vein bypass graft,aorto-fem-pop May 2012  . Cardiac valve replacement Aug. 2012    Family History  Problem Relation Age of Onset  . Heart disease Father 8    died to MI at 40yo  . Heart disease Brother 71    died of MI at 21yo    History  Substance Use Topics  . Smoking status: Former Smoker -- 1.0 packs/day for 50 years    Types: Cigarettes    Quit date: 07/12/2010  . Smokeless tobacco: Not on file  . Alcohol Use: No    OB History    Grav Para Term Preterm Abortions TAB SAB Ect Mult Living                  Review of Systems  Constitutional: Negative.   HENT: Positive for congestion, rhinorrhea and sinus pressure.   Eyes: Negative.   Respiratory: Negative.  Negative for cough.   Cardiovascular: Negative.   Gastrointestinal: Negative.   Genitourinary: Negative.   Musculoskeletal: Negative.   Skin: Negative.   Neurological: Positive for headaches.    Allergies  Ativan; Morphine; Percocet; and Tramadol hcl  Home Medications   Current Outpatient Rx  Name Route Sig Dispense Refill  . ALPRAZOLAM 1 MG PO TABS Oral Take 1 tablet (1 mg total) by mouth 3 (three) times daily as needed for sleep or anxiety. 90 tablet 3  . AMIODARONE HCL 200 MG PO TABS Oral Take 1 tablet (200 mg total) by mouth daily. 30 tablet 12  . ASPIRIN 81 MG PO TABS Oral Take 81 mg by mouth daily.      . BUPROPION HCL ER (XL) 150 MG PO TB24 Oral Take 1 tablet (150 mg total) by mouth daily. 30 tablet 0  . DOCUSATE SODIUM 100 MG PO CAPS Oral Take 1 capsule (100 mg total) by mouth 2 (two) times daily. 60 capsule 7  .  DULOXETINE HCL 30 MG PO CPEP  Take 3 tablets by mouth once daily 90 capsule 7  . FLUTICASONE-SALMETEROL 250-50 MCG/DOSE IN AEPB Inhalation Inhale 1 puff into the lungs every 12 (twelve) hours. 1 each 11  . FUROSEMIDE 80 MG PO TABS Oral Take 1 tablet (80 mg total) by mouth 2 (two) times daily. 60 tablet 12  . GABAPENTIN 300 MG PO CAPS Oral Take 1 capsule (300 mg total) by mouth 2 (two) times daily. 60 capsule 5  . HYDROCODONE-ACETAMINOPHEN 5-325 MG PO TABS  1-2 tablets every 6 hours as needed for pain. Do not take with alcohol, do no drive or operate heavy  machinery if drowsiness occurs. Do not take more than instructed. 240 tablet 3  . INSULIN ASPART 100 UNIT/ML [place] SOLN  6 units to cover 3-4 carb choices each meal 2-3x a day Before meals. Add correction amount of Novolog to food Novolog if needed up to a total of 30 units per day: If blood sugar is: < 150= no correction, if 151-200 add 1 unit, if 201-250 add 2 units, if 251-300 add 3 units, if 301-350 add 4 units, if 351-400 add 5 units, if 401- 450 add 6 units, if > 450 please call office for instructions.    Marland Kitchen LORATADINE 10 MG PO TABS Oral Take 1 tablet (10 mg total) by mouth daily. 30 tablet 7  . OMEPRAZOLE 40 MG PO CPDR Oral Take 40 mg by mouth 2 (two) times daily.     Marland Kitchen POTASSIUM CHLORIDE CRYS ER 20 MEQ PO TBCR Oral Take 1 tablet (20 mEq total) by mouth 3 (three) times daily. 90 tablet 12  . PRESCRIPTION MEDICATION  02 one liter as needed    . ROSUVASTATIN CALCIUM 20 MG PO TABS Oral Take 1 tablet (20 mg total) by mouth daily. 30 tablet 7  . TIOTROPIUM BROMIDE MONOHYDRATE 18 MCG IN CAPS Inhalation Place 1 capsule (18 mcg total) into inhaler and inhale daily. 30 capsule 7  . WARFARIN SODIUM 2 MG PO TABS  TAKE AS DIRECTED BY ANTICOAGULATION CLINIC 35 tablet 3  . ACCU-CHEK FASTCLIX LANCETS MISC Does not apply 1 each by Does not apply route 3 (three) times daily before meals. Dx code- 250.00 102 each 12  . ALBUTEROL 90 MCG/ACT IN AERS Inhalation Inhale  1-2 puffs into the lungs every 4 (four) hours as needed. 17 g 11  . AMOXICILLIN-POT CLAVULANATE 875-125 MG PO TABS Oral Take 1 tablet by mouth 2 (two) times daily. 20 tablet 0  . GLUCOSE BLOOD VI STRP  Use to test blood sugar 3 times daily. 100 each 11    One Touch Ultra test strips  . INSULIN DETEMIR 100 UNIT/ML [place] SOLN Subcutaneous Inject 40 Units into the skin at bedtime. 15 mL 12  . INSULIN PEN NEEDLE 31G X 5 MM MISC  Please check blood sugars 4 times daily. Before breakfast and all meals. 100 each 11  . INSULIN SYRINGE 31G X 5/16" 0.3 ML MISC Does not apply 1 Syringe by Does not apply route 2 (two) times daily at 10 AM and 5 PM. 100 each 11  . INSULIN SYRINGE 30G X 1/2" 1 ML MISC Does not apply 1 Container by Does not apply route 2 (two) times daily. 100 each 11  . ACCU-CHEK FASTCLIX LANCET KIT Does not apply 1 each by Does not apply route 3 (three) times daily. 1 kit 0    BP 122/69  Pulse 83  Temp(Src) 97.2 F (36.2 C) (Oral)  Resp 22  SpO2 96%  Physical Exam  Nursing note and vitals reviewed. Constitutional: She is oriented to person, place, and time. She appears well-developed and well-nourished.  HENT:  Head: Normocephalic and atraumatic.  Right Ear: Tympanic membrane is retracted.  Left Ear: Tympanic membrane is retracted.  Nose: Right sinus exhibits maxillary sinus tenderness and frontal sinus tenderness. Left sinus exhibits maxillary sinus tenderness and frontal sinus tenderness.  Mouth/Throat: Uvula is midline, oropharynx is clear and moist and mucous membranes are normal.  Eyes: EOM are normal.  Neck: Normal range of motion.  Pulmonary/Chest: Effort normal and breath sounds normal. She has no decreased breath sounds.  She has no wheezes. She has no rhonchi. She has no rales.  Musculoskeletal: Normal range of motion.  Neurological: She is alert and oriented to person, place, and time.  Skin: Skin is warm and dry.  Psychiatric: Her behavior is normal.    ED Course    Procedures (including critical care time)  Labs Reviewed - No data to display No results found.   1. Sinusitis       MDM  rx given for Augmentin; continue nasal lavage; supportive care        Marcell Anger, MD 03/08/11 2040

## 2011-03-08 NOTE — ED Notes (Addendum)
Pt with onset cough/sinus congestion/left facial pain/headache pt believes has sinus infection - pt also with c/o pain left thumb wearing wrist splint for comfort

## 2011-03-08 NOTE — Discharge Instructions (Signed)
Take antibiotics as directed. Continue to use nasal saline sprays or lavage as directed and as needed. Return to care should your symptoms not improve, or worsen in any way.

## 2011-03-10 ENCOUNTER — Ambulatory Visit (HOSPITAL_COMMUNITY): Payer: Medicare Other

## 2011-03-11 ENCOUNTER — Ambulatory Visit: Payer: Medicare Other | Admitting: Dietician

## 2011-03-11 ENCOUNTER — Telehealth: Payer: Self-pay | Admitting: Dietician

## 2011-03-11 MED ORDER — INSULIN DETEMIR 100 UNIT/ML ~~LOC~~ SOLN: 45.0000 [IU] | Freq: Every day | SUBCUTANEOUS | Status: DC

## 2011-03-11 NOTE — Telephone Encounter (Signed)
Blood sugar is currently 526 at 1530: went to urgent care over weekend and does still have sinus infection. They put her on augmentin. Headache gone, but still feels bad. Changed from lantus to levemir on 03/03/11. Blood sugar last night was 419, she took 12 units and ate and woke up with a 203 blood sugar at 1130 this am. Took 7 units Novolog ~ 1210 PM and ate her usual breakfast.   Wants to know if maybe her blood sugars are higher due to the change to levemir. Wants to know if she can increase the levemir to 45 units tonight.   Will discuss with attending and return call.

## 2011-03-11 NOTE — Telephone Encounter (Signed)
Ok with me to increase to 45. I changed levemir in EPIC.

## 2011-03-11 NOTE — Telephone Encounter (Signed)
Called patient and notified her.

## 2011-03-12 ENCOUNTER — Ambulatory Visit (HOSPITAL_COMMUNITY): Payer: Medicare Other

## 2011-03-13 ENCOUNTER — Encounter: Payer: Medicare Other | Admitting: Internal Medicine

## 2011-03-14 ENCOUNTER — Other Ambulatory Visit: Payer: Self-pay | Admitting: *Deleted

## 2011-03-14 ENCOUNTER — Ambulatory Visit (HOSPITAL_COMMUNITY): Payer: Medicare Other

## 2011-03-14 ENCOUNTER — Ambulatory Visit (INDEPENDENT_AMBULATORY_CARE_PROVIDER_SITE_OTHER): Payer: Medicare Other | Admitting: Internal Medicine

## 2011-03-14 ENCOUNTER — Ambulatory Visit (INDEPENDENT_AMBULATORY_CARE_PROVIDER_SITE_OTHER): Payer: Medicare Other | Admitting: Dietician

## 2011-03-14 ENCOUNTER — Encounter: Payer: Self-pay | Admitting: Internal Medicine

## 2011-03-14 VITALS — BP 124/75 | HR 80 | Ht 62.0 in | Wt 179.7 lb

## 2011-03-14 DIAGNOSIS — M79674 Pain in right toe(s): Secondary | ICD-10-CM

## 2011-03-14 DIAGNOSIS — R0989 Other specified symptoms and signs involving the circulatory and respiratory systems: Secondary | ICD-10-CM

## 2011-03-14 DIAGNOSIS — R0600 Dyspnea, unspecified: Secondary | ICD-10-CM

## 2011-03-14 DIAGNOSIS — E119 Type 2 diabetes mellitus without complications: Secondary | ICD-10-CM

## 2011-03-14 DIAGNOSIS — M79609 Pain in unspecified limb: Secondary | ICD-10-CM

## 2011-03-14 DIAGNOSIS — R0609 Other forms of dyspnea: Secondary | ICD-10-CM

## 2011-03-14 LAB — POCT GLYCOSYLATED HEMOGLOBIN (HGB A1C): Hemoglobin A1C: 8.5

## 2011-03-14 LAB — GLUCOSE, CAPILLARY: Glucose-Capillary: 181 mg/dL — ABNORMAL HIGH (ref 70–99)

## 2011-03-14 MED ORDER — INSULIN ASPART 100 UNIT/ML ~~LOC~~ SOLN: SUBCUTANEOUS | Status: DC

## 2011-03-14 NOTE — Progress Notes (Signed)
Medical Nutrition Therapy:  Appt start time: 1000 end time:  1100.  Assessment:  Primary concerns today: Blood sugar control Usual eating pattern includes Meal 2 and 2+ snacks per day.    Doing well on Levemir and new lancing device. Counts carbs, aware of sometimes eating more. She does not desire to dose insulin using carb counting but to try to keep meals consistent. Recalculated insulin needs: used Total daily dose of ~ 72 units insulin per day.  Patient currently taking 45 units levemir and ~ 27 unit units Novolog each day for an actual total of 72-74 units each day. ICR is 1:6 and Correction 1: 25.  .    Progress Towards Goal(s):  In progress   Nutritional Diagnosis:   NB-1.1 Food and nutrition-related knowledge deficit As related to not knowing how to dose Novolog insulin to correct high blood sugar resolved as patient shows records of following correction scale.  NB 1.4 self monitoring deficit resolved patient kept very good records and is checking blood sugar often      Interventions: 1- Recalculated insulin to carb ratio is 1:6- patient meals with ~ 42-60 gram carb so likely needs increased amount of prandail insulin. Suggest increase to 7 units each meal. 2- Reviewed meter download with patient: shows elevations during daytime indicating inadequate meal coverage and correction insulin. 3- Coordination of care- suggest increase in Novolog to 7 units with meals, consider increased correction to 1unit:25 mg/dl prior to probable need to slightly increase basal  Monitoring/Evaluation:  Dietary intake, blood sugars and insulin amounts in 2 week(s)

## 2011-03-14 NOTE — Patient Instructions (Addendum)
Syringe reuse guidelines provided today. If approved new Novolog doses for mealtime and correction:   Meal time Insulin for about 45 grams carbohydrate =  7 units Novolog  Correction Insulin (to be added to mealtime if needed)        < 150 mg/dl = no correction 151-200 =  Add 2 units 201-250 = add 4 units 251-300 = add 6 units 301-350 = add 8 units 351-400 = add 10 units 401-450 = add 12 units   450 add 14 units and call office

## 2011-03-14 NOTE — Patient Instructions (Signed)
   Please follow-up at the clinic in 2-3 weeks with [name] for reevaluation of your diabetes.  Please follow-up with me in 1-2 months for reevaluation of your diabetes, breathing.  There have been changes in your medications, please look at your complete medication list for details. - your sliding scale and meal coverage insulin were increased.  Please follow-up in the clinic sooner if needed.  If you have been started on new medication(s), and you develop symptoms concerning for allergic reaction, including, but not limited to, throat closing, tongue swelling, rash, please stop the medication immediately and call the clinic at [phone], and go to the ER.  If you are diabetic, please bring your meter to your next visit.  If symptoms worsen, or new symptoms arise, please call the clinic or go to the ER.  Please bring all of your medications in a bag to your next visit.

## 2011-03-14 NOTE — Progress Notes (Signed)
Subjective:   Patient ID: [name] female   DOB: 1950/08/14 61 y.o.   MRN: [id number]  HPI: [name] is a 61 y.o. with a PMHx of O2-dependent COPD, DMII, HTN, mitral stenosis with recent porcine valve replacement, who presented to clinic today for the following:  1) DM, II, insulin dependent (A1c today is 8.5) - Patient checking blood sugars 3-4 times daily, before breakfast, lunch and dinner. Reports fasting blood sugars of 2110s mg/dL. Currently taking Levemir and Novolog (she was switched to levemir because Lantus was giving burning sensation). 0 hypoglycemic episodes since last visit. denies polyuria, polydipsia, nausea, vomiting, diarrhea.  does request refills today. She wants to switch to levemir vials because too difficult to administer the flexpen.  2) SOB - continues to have chronic shortness of breath, particularly with walking even short distances (such as one side of her house to another). She returned to clinic in 12/2010, and continued to be hypoxic with ambulation, therefore, she was arranged for home oxygen. She has had some improvement of symptoms with the oxygen therapy. Today, she has some paperwork to be completed to document her need for home oxygen.   No new fevers, chills, cough, chest pain, orthopnea, PND, lower extremity edema, nasal congestion. Still occasionally, getting lightheaded and tired after walking from one side of the house to another. Still has not completed cardiac rehab, is still pending.   3) HTN - Patient does not check blood pressure regularly at home. Currently taking lasix, amiodarone. denies headaches, confirms positional lightheadedness when quickly moves from sitting to standing or laying to sitting positions.  does not request refills today.  4) Right toe pain - patient indicates right great toe pain near the medial nail edge. No recent trauma, falls, accidents. No hangnail, redness, warmth, swelling.     Review of Systems: Per  HPI.  Current Outpatient Medications: Medication Sig  . ACCU-CHEK FASTCLIX LANCETS MISC 1 each by Does not apply route 3 (three) times daily before meals. Dx code- 250.00  . albuterol (PROVENTIL,VENTOLIN) 90 MCG/ACT inhaler Inhale 1-2 puffs into the lungs every 4 (four) hours as needed.  . ALPRAZolam (XANAX) 1 MG tablet Take 1 tablet (1 mg total) by mouth 3 (three) times daily as needed for sleep or anxiety.  Marland Kitchen amiodarone (PACERONE) 200 MG tablet Take 1 tablet (200 mg total) by mouth daily.  Marland Kitchen amoxicillin-clavulanate (AUGMENTIN) 875-125 MG per tablet Take 1 tablet by mouth 2 (two) times daily.  Marland Kitchen aspirin 81 MG tablet Take 81 mg by mouth daily.    Marland Kitchen buPROPion (WELLBUTRIN XL) 150 MG 24 hr tablet Take 1 tablet (150 mg total) by mouth daily.  Marland Kitchen docusate sodium (COLACE) 100 MG capsule Take 1 capsule (100 mg total) by mouth 2 (two) times daily.  . DULoxetine (CYMBALTA) 30 MG capsule Take 3 tablets by mouth once daily  . Fluticasone-Salmeterol (ADVAIR DISKUS) 250-50 MCG/DOSE AEPB Inhale 1 puff into the lungs every 12 (twelve) hours.  . furosemide (LASIX) 80 MG tablet Take 1 tablet (80 mg total) by mouth 2 (two) times daily.  Marland Kitchen gabapentin (NEURONTIN) 300 MG capsule Take 1 capsule (300 mg total) by mouth 2 (two) times daily.  Marland Kitchen glucose blood test strip Use to test blood sugar 3 times daily.  Marland Kitchen HYDROcodone-acetaminophen (NORCO) 5-325 MG per tablet 1-2 tablets every 6 hours as needed for pain. Do not take with alcohol, do no drive or operate heavy machinery if drowsiness occurs. Do not take more than instructed.  Marland Kitchen  insulin aspart (NOVOLOG) 100 UNIT/ML injection 6 units to cover 3-4 carb choices each meal 2-3x a day Before meals. Add correction amount of Novolog to food Novolog if needed up to a total of 30 units per day: If blood sugar is: < 150= no correction, if 151-200 add 1 unit, if 201-250 add 2 units, if 251-300 add 3 units, if 301-350 add 4 units, if 351-400 add 5 units, if 401- 450 add 6 units, if >  450 please call office for instructions.  . insulin detemir (LEVEMIR FLEXPEN) 100 UNIT/ML injection Inject 45 Units into the skin at bedtime.  . Insulin Pen Needle 31G X 5 MM MISC Please check blood sugars 4 times daily. Before breakfast and all meals.  . Insulin Syringe-Needle U-100 (INSULIN SYRINGE .3CC/31GX5/16") 31G X 5/16" 0.3 ML MISC 1 Syringe by Does not apply route 2 (two) times daily at 10 AM and 5 PM.  . Insulin Syringe-Needle U-100 (INSULIN SYRINGE 1CC/30GX1/2") 30G X 1/2" 1 ML MISC 1 Container by Does not apply route 2 (two) times daily.  . Lancets Misc. (ACCU-CHEK FASTCLIX LANCET) KIT 1 each by Does not apply route 3 (three) times daily.  Marland Kitchen loratadine (CLARITIN) 10 MG tablet Take 1 tablet (10 mg total) by mouth daily.  Marland Kitchen omeprazole (PRILOSEC) 40 MG capsule Take 40 mg by mouth 2 (two) times daily.   . potassium chloride SA (K-DUR,KLOR-CON) 20 MEQ tablet Take 1 tablet (20 mEq total) by mouth 3 (three) times daily.  Marland Kitchen PRESCRIPTION MEDICATION 02 one liter as needed  . rosuvastatin (CRESTOR) 20 MG tablet Take 1 tablet (20 mg total) by mouth daily.  Marland Kitchen tiotropium (SPIRIVA) 18 MCG inhalation capsule Place 1 capsule (18 mcg total) into inhaler and inhale daily.  Marland Kitchen warfarin (COUMADIN) 2 MG tablet TAKE AS DIRECTED BY ANTICOAGULATION CLINIC    Allergies  Allergen Reactions  . Ativan     Patient's sister noted that ativan caused the patient to become extremely confused during hospitalization 09/2010  . Morphine     REACTION: Unknown reaction  . Percocet (Oxycodone-Acetaminophen)     headache  . Tramadol Hcl     REACTION: swelling    Past Medical History  Diagnosis Date  . Depression   . GERD (gastroesophageal reflux disease)     Followed by [name] // small bowel capsule endoscopy (11/2009) - minimal chronic inflammation, otherwise negative for  H.pylori, metaplasia, dysplasia  . Hypertension   . Hyperlipidemia   . Diabetes mellitus type II, uncontrolled     on insulin therapy  .  COPD (chronic obstructive pulmonary disease) 2008    started home O2 (12/2010) // Prior significant smoker  // her CT of the chest there was no evidence of primary carcinoid tumor , scattered normal-sized mediastinal and hilar as well as axillary lymph nodes noted., no significant lymphadenopathy. COPD and emphysema with no acute cardiopulmonary disease noted , mild cardiomegaly and solitary calcified  plaque  . Moderate to severe pulmonary hypertension     severe per TEE (08/2010) - Peak RV-RA gradient 95m Hg  . Mitral regurgitation     mild to moderate per TEE (08/2010)  . Shingles   . Anemia     Anemia panel (08/2009) - iron 21, TIBC 441, UIBC 420,  ferritin 11 // Pt has been unable to tolerate iron supplementation due to chronic abdominal discomfort, source of which still is not clear.  .Marland KitchenHistory of colonic polyps     Colonoscopy (10/2005) - colon polyp with  path nonadenomatous, no malignancy - by [name]. // Colonoscopy (02/2002) - 2 polyps noted, 1 - adenomatous, 2 - hyperplastic // Followed by [name]  . Anxiety   . IBS (irritable bowel syndrome)   . Osteoporosis     DEXA (12/2004) - L-spine T score -2.6, Left hip -0.1  . Fibromyalgia   . Obstructive sleep apnea     on CPAP. // Sleep study (06/2009) - Moderate sleep apnea/ hypopnea syndrome , AHI 17.8 per hour with nonpositional hypopneas. CPAP titration to 12 CWP, AHI 2.4 per hour. Small resMed Quattro full-face mask with heated humidifier  . Restless leg syndrome   . Mitral stenosis     moderate per TEE (08/2010) // Mitral valve replacement with a 27-mm pericardial porcine valve (Medtronic Mosaic valve, serial #[id number]) on 09/20/2010 - [name] // Followed at [facility], Dr. Stanford Breed  . Bilateral carotid artery stenosis     s/p right endarterectomy (06/2010). // Carotid US (07/2010) -  60%-79% ICA stenosis, mid range of scale by velocity.  Left: Moderate-to-severe calcific and non-calcific plaque origin  andproximal ICA and ECA.  60%-79% ICA stenosis, highest end of scale // Followed by [name].  . Diabetes mellitus   . Peripheral vascular disease     Past Surgical History  Procedure Date  . Orif clavicle fracture 01/2004    by Thana Farr. [name], M.D for Right clavicle nonunion.  . Tonsillectomy   . Carotid endarterectomy 07/04/2010    right, by [name] for asymptomatic right carotid artery stenosis  . Lipoma exision 08/2005    occipital lipoma 1.5cm - by Dr. [name]  . Hysteroscopy with endometrial ablation 06/2001    for persistent post-menopausal bleeding // by S. [name], M.D.  . Mitral valve replacement 09/20/10     with a 27-mm pericardial porcine valve (Medtronic Mosaic valve, serial #[id number]). 09/20/10, Dr Prescott Gum  . Left atrial maze procedure 09/20/10    for paroxysmal atrial fibrillation (Dr. Prescott Gum)  . Placement of left chest tube. 09/24/2010    Dr Prescott Gum  . Pr vein bypass graft,aorto-fem-pop May 2012  . Cardiac valve replacement Aug. 2012     Objective:   Physical Exam: Filed Vitals:   03/14/11 1548  BP: 124/75  Pulse: 80   Pulse oximetry: Activity + / - oxygen O2 saturation  Rest No oxygen - on room air 88 %  Exertion No oxygen - on room air 87%  Exertion On 2L nasal oxygen 90%    General: Vital signs reviewed and noted. Well-developed, well-nourished, in no acute distress; alert, appropriate and cooperative throughout examination.  Head: Normocephalic, atraumatic.  Lungs:  Normal respiratory effort. Clear to auscultation BL without crackles or wheezes.  Heart: RRR. S1 and S2 normal without gallop, rubs. (+) murmur.  Abdomen:  BS normoactive. Soft, Nondistended, non-tender.  No masses or organomegaly.  Extremities: No pretibial edema. Right great toe without warmth, tenderness, redness. No hangnails. No swelling or warmth over MTP joint.    Assessment & Plan:   Case and plan of care discussed with Dr. Milta Deiters.

## 2011-03-17 ENCOUNTER — Ambulatory Visit (HOSPITAL_COMMUNITY): Payer: Medicare Other

## 2011-03-17 MED ORDER — LORATADINE 10 MG PO TABS: 10.0000 mg | ORAL_TABLET | Freq: Every day | ORAL | Status: DC

## 2011-03-19 ENCOUNTER — Ambulatory Visit (HOSPITAL_COMMUNITY): Payer: Medicare Other

## 2011-03-19 ENCOUNTER — Telehealth: Payer: Self-pay | Admitting: Dietician

## 2011-03-19 NOTE — Telephone Encounter (Signed)
Patient called about the following rxs that her mail order pharmacy ([facility])does not have:  1- Novolog dose - they do not have new dose, suggest adding on to the directions " Up to 50 units a day" to the [employer], will needs 2 vials a month  2- levemir - [place] wants to change back to vials- for dose of 45 units a day patient will needs 2 vials a month  Next delivery is 2-16th, would like changes made before them.

## 2011-03-20 DIAGNOSIS — M79674 Pain in right toe(s): Secondary | ICD-10-CM | POA: Insufficient documentation

## 2011-03-20 MED ORDER — INSULIN ASPART 100 UNIT/ML ~~LOC~~ SOLN: SUBCUTANEOUS | Status: DC

## 2011-03-20 MED ORDER — INSULIN DETEMIR 100 UNIT/ML ~~LOC~~ SOLN: 45.0000 [IU] | Freq: Every day | SUBCUTANEOUS | Status: DC

## 2011-03-20 NOTE — Telephone Encounter (Signed)
Done.

## 2011-03-20 NOTE — Assessment & Plan Note (Signed)
Labs: Lab Results  Component Value Date   HGBA1C 8.5 03/14/2011   HGBA1C 7.2* 09/18/2010   CREATININE 1.00 11/18/2010   CREATININE 0.90 07/24/2010   MICROALBUR 1.60 11/09/2009   MICRALBCREAT 13.9 11/09/2009   CHOL 131 08/03/2010   HDL 33* 08/03/2010   TRIG 196* 08/03/2010    Last eye exam and foot exam:    Component Value Date/Time   HMDIABEYEEXA no evidence of diabetic retinopathy - Dr. Katy Fitch 08/13/2010   HMDIABFOOTEX  negative 09/14/2009     Assessment:     Status:  Pt notes snacking. She was recently changed to Levemir from Lantus due to burning sensation with Lantus, and notes improved ease of use with this new regimen. She is having some difficulty with using the Levemir flexpen secondary to physical limitations - prefers to change back to vials.  Disease Control: not controlled  Progress toward goals: deteriorated  Barriers to meeting goals: have just changed insulin therapies - will take a little bit of time to titrate the new regimen.   Plan: Glucometer log was reviewed today, as pt did have glucometer available for review.      Change to levemir vials  Escalate sliding scale insulin regimen.  Appreciate Butch Penny Plyler's input and suggestions.

## 2011-03-20 NOTE — Assessment & Plan Note (Signed)
Pt's shortness of breath and DOE is likely multifactorial secondary to her COPD (prior 50 pack year smoking history), OSA on sleep apnea, and recent mitral valve replacement for severe mitral stenosis, severe pulmonary hypertension. She unfortunately has not yet started cardiac rehab after her mitral valve replacement - but plans to start that soon.   We checked her oxygen saturations with rest 88% and with ambulation 87% off of oxygen thearpy. Also in the setting of her severe pulmonary hypertension this collectively indicates that the patient now needs home oxygen therapy.   Plan:  Continue oxygen supplementation.  Pt counseled that if symptoms worsen, she should return to er for further evaluation.  Congratulated on smoking cessation and encouraged/advised to continue her efforts.  Encouraged to complete cardiac rehab.

## 2011-03-20 NOTE — Assessment & Plan Note (Signed)
No open lesions, warmth, erythema, or tenderness over MTP joint. I think this is likely just due to ill-fitting shoes. No ulcerations or calluses.   Change shoes if able - or add orthotics.

## 2011-03-21 ENCOUNTER — Ambulatory Visit (HOSPITAL_COMMUNITY): Payer: Medicare Other

## 2011-03-24 ENCOUNTER — Ambulatory Visit (HOSPITAL_COMMUNITY): Payer: Medicare Other

## 2011-03-26 ENCOUNTER — Ambulatory Visit (HOSPITAL_COMMUNITY): Payer: Medicare Other

## 2011-03-28 ENCOUNTER — Ambulatory Visit (HOSPITAL_COMMUNITY): Payer: Medicare Other

## 2011-03-31 ENCOUNTER — Ambulatory Visit (HOSPITAL_COMMUNITY): Payer: Medicare Other

## 2011-04-02 ENCOUNTER — Ambulatory Visit (HOSPITAL_COMMUNITY): Payer: Medicare Other

## 2011-04-04 ENCOUNTER — Ambulatory Visit (HOSPITAL_COMMUNITY): Payer: Medicare Other

## 2011-04-07 ENCOUNTER — Ambulatory Visit (INDEPENDENT_AMBULATORY_CARE_PROVIDER_SITE_OTHER): Payer: Medicare Other | Admitting: Pharmacist

## 2011-04-07 ENCOUNTER — Encounter: Payer: Self-pay | Admitting: Internal Medicine

## 2011-04-07 ENCOUNTER — Ambulatory Visit (HOSPITAL_COMMUNITY): Payer: Medicare Other

## 2011-04-07 DIAGNOSIS — I4891 Unspecified atrial fibrillation: Secondary | ICD-10-CM

## 2011-04-07 LAB — POCT INR: INR: 2.8

## 2011-04-09 ENCOUNTER — Ambulatory Visit (HOSPITAL_COMMUNITY): Payer: Medicare Other

## 2011-04-11 ENCOUNTER — Ambulatory Visit (HOSPITAL_COMMUNITY): Payer: Medicare Other

## 2011-04-14 ENCOUNTER — Ambulatory Visit (HOSPITAL_COMMUNITY): Payer: Medicare Other

## 2011-04-16 ENCOUNTER — Ambulatory Visit (HOSPITAL_COMMUNITY): Payer: Medicare Other

## 2011-04-17 ENCOUNTER — Other Ambulatory Visit: Payer: Self-pay | Admitting: *Deleted

## 2011-04-17 DIAGNOSIS — M797 Fibromyalgia: Secondary | ICD-10-CM

## 2011-04-17 MED ORDER — HYDROCODONE-ACETAMINOPHEN 5-325 MG PO TABS: ORAL_TABLET | ORAL | Status: DC

## 2011-04-17 NOTE — Telephone Encounter (Signed)
Hi Debbie, please call in these prescriptions. Please note the date restrictions that I have placed on the order.  Thank you!  [name], D.O.

## 2011-04-18 ENCOUNTER — Ambulatory Visit (HOSPITAL_COMMUNITY): Payer: Medicare Other

## 2011-04-18 NOTE — Telephone Encounter (Signed)
Pharmacy called and talked with Glendell Docker and given also dates of refills.

## 2011-04-21 ENCOUNTER — Telehealth: Payer: Self-pay | Admitting: *Deleted

## 2011-04-21 ENCOUNTER — Ambulatory Visit (HOSPITAL_COMMUNITY): Payer: Medicare Other

## 2011-04-21 NOTE — Telephone Encounter (Signed)
Pt called with question about MRSA. She has redness under abdominal fold and has been treating with santi wipes and then nystatin cream.  Today she noted a drop of blood and was concerned it might be MRSA. Pt advised to clean with warm soapy  water and dry well.  Then apply the nystatin cream. Do not use santi wipes.   If any changes or no improvement call for office appointment.

## 2011-04-21 NOTE — Telephone Encounter (Signed)
MRSA is unlikely given description of problem.  Agree with advice.  If lesion does not heal or progresses I also agree with calling to make an appointment to allow [place] to look at the lesion.

## 2011-04-23 ENCOUNTER — Ambulatory Visit (HOSPITAL_COMMUNITY): Payer: Medicare Other

## 2011-04-25 ENCOUNTER — Ambulatory Visit (HOSPITAL_COMMUNITY): Payer: Medicare Other

## 2011-04-28 ENCOUNTER — Ambulatory Visit (HOSPITAL_COMMUNITY): Payer: Medicare Other

## 2011-04-30 ENCOUNTER — Ambulatory Visit (HOSPITAL_COMMUNITY): Payer: Medicare Other

## 2011-05-02 ENCOUNTER — Ambulatory Visit (HOSPITAL_COMMUNITY): Payer: Medicare Other

## 2011-05-05 ENCOUNTER — Ambulatory Visit (HOSPITAL_COMMUNITY): Payer: Medicare Other

## 2011-05-07 ENCOUNTER — Other Ambulatory Visit: Payer: Self-pay

## 2011-05-07 ENCOUNTER — Inpatient Hospital Stay (HOSPITAL_COMMUNITY)
Admission: EM | Admit: 2011-05-07 | Discharge: 2011-05-13 | DRG: 312 | Disposition: A | Discharge status: Skilled Nursing Facility | Payer: Medicare Other | Attending: Internal Medicine | Admitting: Internal Medicine

## 2011-05-07 ENCOUNTER — Ambulatory Visit (HOSPITAL_COMMUNITY): Payer: Medicare Other

## 2011-05-07 ENCOUNTER — Emergency Department (HOSPITAL_COMMUNITY): Payer: Medicare Other

## 2011-05-07 ENCOUNTER — Encounter (HOSPITAL_COMMUNITY): Payer: Self-pay | Admitting: *Deleted

## 2011-05-07 ENCOUNTER — Telehealth: Payer: Self-pay | Admitting: Dietician

## 2011-05-07 DIAGNOSIS — I6529 Occlusion and stenosis of unspecified carotid artery: Secondary | ICD-10-CM | POA: Diagnosis present

## 2011-05-07 DIAGNOSIS — G2581 Restless legs syndrome: Secondary | ICD-10-CM | POA: Diagnosis present

## 2011-05-07 DIAGNOSIS — Z794 Long term (current) use of insulin: Secondary | ICD-10-CM

## 2011-05-07 DIAGNOSIS — J439 Emphysema, unspecified: Secondary | ICD-10-CM | POA: Insufficient documentation

## 2011-05-07 DIAGNOSIS — M797 Fibromyalgia: Secondary | ICD-10-CM

## 2011-05-07 DIAGNOSIS — E785 Hyperlipidemia, unspecified: Secondary | ICD-10-CM | POA: Diagnosis present

## 2011-05-07 DIAGNOSIS — I739 Peripheral vascular disease, unspecified: Secondary | ICD-10-CM | POA: Diagnosis present

## 2011-05-07 DIAGNOSIS — D649 Anemia, unspecified: Secondary | ICD-10-CM

## 2011-05-07 DIAGNOSIS — Z9181 History of falling: Secondary | ICD-10-CM

## 2011-05-07 DIAGNOSIS — R252 Cramp and spasm: Secondary | ICD-10-CM

## 2011-05-07 DIAGNOSIS — I509 Heart failure, unspecified: Secondary | ICD-10-CM | POA: Diagnosis present

## 2011-05-07 DIAGNOSIS — K589 Irritable bowel syndrome without diarrhea: Secondary | ICD-10-CM | POA: Diagnosis present

## 2011-05-07 DIAGNOSIS — D126 Benign neoplasm of colon, unspecified: Secondary | ICD-10-CM | POA: Diagnosis present

## 2011-05-07 DIAGNOSIS — E118 Type 2 diabetes mellitus with unspecified complications: Secondary | ICD-10-CM | POA: Insufficient documentation

## 2011-05-07 DIAGNOSIS — I4891 Unspecified atrial fibrillation: Secondary | ICD-10-CM

## 2011-05-07 DIAGNOSIS — Z7901 Long term (current) use of anticoagulants: Secondary | ICD-10-CM | POA: Diagnosis present

## 2011-05-07 DIAGNOSIS — Z952 Presence of prosthetic heart valve: Secondary | ICD-10-CM

## 2011-05-07 DIAGNOSIS — F419 Anxiety disorder, unspecified: Secondary | ICD-10-CM

## 2011-05-07 DIAGNOSIS — D509 Iron deficiency anemia, unspecified: Secondary | ICD-10-CM | POA: Diagnosis present

## 2011-05-07 DIAGNOSIS — T4275XA Adverse effect of unspecified antiepileptic and sedative-hypnotic drugs, initial encounter: Secondary | ICD-10-CM | POA: Diagnosis present

## 2011-05-07 DIAGNOSIS — Z7982 Long term (current) use of aspirin: Secondary | ICD-10-CM

## 2011-05-07 DIAGNOSIS — I2789 Other specified pulmonary heart diseases: Secondary | ICD-10-CM | POA: Diagnosis present

## 2011-05-07 DIAGNOSIS — G4733 Obstructive sleep apnea (adult) (pediatric): Secondary | ICD-10-CM | POA: Diagnosis present

## 2011-05-07 DIAGNOSIS — E119 Type 2 diabetes mellitus without complications: Secondary | ICD-10-CM

## 2011-05-07 DIAGNOSIS — I2510 Atherosclerotic heart disease of native coronary artery without angina pectoris: Secondary | ICD-10-CM | POA: Diagnosis present

## 2011-05-07 DIAGNOSIS — F341 Dysthymic disorder: Secondary | ICD-10-CM | POA: Diagnosis present

## 2011-05-07 DIAGNOSIS — Z951 Presence of aortocoronary bypass graft: Secondary | ICD-10-CM

## 2011-05-07 DIAGNOSIS — K219 Gastro-esophageal reflux disease without esophagitis: Secondary | ICD-10-CM

## 2011-05-07 DIAGNOSIS — I10 Essential (primary) hypertension: Secondary | ICD-10-CM

## 2011-05-07 DIAGNOSIS — I5032 Chronic diastolic (congestive) heart failure: Secondary | ICD-10-CM | POA: Diagnosis present

## 2011-05-07 DIAGNOSIS — Z860100 Personal history of colon polyps, unspecified: Secondary | ICD-10-CM

## 2011-05-07 DIAGNOSIS — E1142 Type 2 diabetes mellitus with diabetic polyneuropathy: Secondary | ICD-10-CM

## 2011-05-07 DIAGNOSIS — J4489 Other specified chronic obstructive pulmonary disease: Secondary | ICD-10-CM | POA: Diagnosis present

## 2011-05-07 DIAGNOSIS — Z79899 Other long term (current) drug therapy: Secondary | ICD-10-CM

## 2011-05-07 DIAGNOSIS — R42 Dizziness and giddiness: Secondary | ICD-10-CM | POA: Diagnosis present

## 2011-05-07 DIAGNOSIS — R269 Unspecified abnormalities of gait and mobility: Secondary | ICD-10-CM | POA: Diagnosis present

## 2011-05-07 DIAGNOSIS — E1169 Type 2 diabetes mellitus with other specified complication: Secondary | ICD-10-CM | POA: Insufficient documentation

## 2011-05-07 DIAGNOSIS — M810 Age-related osteoporosis without current pathological fracture: Secondary | ICD-10-CM | POA: Diagnosis present

## 2011-05-07 DIAGNOSIS — E1140 Type 2 diabetes mellitus with diabetic neuropathy, unspecified: Secondary | ICD-10-CM | POA: Insufficient documentation

## 2011-05-07 DIAGNOSIS — J449 Chronic obstructive pulmonary disease, unspecified: Secondary | ICD-10-CM

## 2011-05-07 DIAGNOSIS — K648 Other hemorrhoids: Secondary | ICD-10-CM | POA: Diagnosis present

## 2011-05-07 DIAGNOSIS — IMO0001 Reserved for inherently not codable concepts without codable children: Secondary | ICD-10-CM | POA: Diagnosis present

## 2011-05-07 DIAGNOSIS — R296 Repeated falls: Secondary | ICD-10-CM

## 2011-05-07 DIAGNOSIS — R55 Syncope and collapse: Principal | ICD-10-CM | POA: Diagnosis present

## 2011-05-07 LAB — COMPREHENSIVE METABOLIC PANEL
ALT: 20 U/L (ref 0–35)
AST: 15 U/L (ref 0–37)
Albumin: 3.9 g/dL (ref 3.5–5.2)
Alkaline Phosphatase: 66 U/L (ref 39–117)
BUN: 22 mg/dL (ref 6–23)
CO2: 27 mEq/L (ref 19–32)
Calcium: 9.6 mg/dL (ref 8.4–10.5)
Chloride: 97 mEq/L (ref 96–112)
Creatinine, Ser: 0.92 mg/dL (ref 0.50–1.10)
GFR calc Af Amer: 76 mL/min — ABNORMAL LOW (ref >90)
GFR calc non Af Amer: 65 mL/min — ABNORMAL LOW (ref >90)
Glucose, Bld: 213 mg/dL — ABNORMAL HIGH (ref 70–99)
Potassium: 3.8 mEq/L (ref 3.5–5.1)
Sodium: 136 mEq/L (ref 135–145)
Total Bilirubin: 0.2 mg/dL — ABNORMAL LOW (ref 0.3–1.2)
Total Protein: 7.3 g/dL (ref 6.0–8.3)

## 2011-05-07 LAB — GLUCOSE, CAPILLARY
Glucose-Capillary: 280 mg/dL — ABNORMAL HIGH (ref 70–99)
Glucose-Capillary: 182 mg/dL — ABNORMAL HIGH (ref 70–99)

## 2011-05-07 LAB — PROTIME-INR
INR: 2.56 — ABNORMAL HIGH (ref 0.00–1.49)
Prothrombin Time: 27.9 seconds — ABNORMAL HIGH (ref 11.6–15.2)

## 2011-05-07 LAB — CBC
HCT: 24.5 % — ABNORMAL LOW (ref 36.0–46.0)
Hemoglobin: 7.1 g/dL — ABNORMAL LOW (ref 12.0–15.0)
MCH: 22.7 pg — ABNORMAL LOW (ref 26.0–34.0)
MCHC: 29 g/dL — ABNORMAL LOW (ref 30.0–36.0)
MCV: 78.3 fL (ref 78.0–100.0)
Platelets: 249 10*3/uL (ref 150–400)
RBC: 3.13 MIL/uL — ABNORMAL LOW (ref 3.87–5.11)
RDW: 17.8 % — ABNORMAL HIGH (ref 11.5–15.5)
WBC: 6.8 10*3/uL (ref 4.0–10.5)

## 2011-05-07 LAB — TROPONIN I: Troponin I: <0.3 ng/mL (ref <0.30)

## 2011-05-07 LAB — OCCULT BLOOD, POC DEVICE: Fecal Occult Bld: NEGATIVE

## 2011-05-07 LAB — PREPARE RBC (CROSSMATCH)

## 2011-05-07 MED ORDER — DOCUSATE SODIUM 100 MG PO CAPS
100.0000 mg | ORAL_CAPSULE | Freq: Two times a day (BID) | ORAL | Status: DC
  Administered 2011-05-08 – 2011-05-13 (×11): 100 mg via ORAL
  Filled 2011-05-07 (×13): qty 1

## 2011-05-07 MED ORDER — ATORVASTATIN CALCIUM 40 MG PO TABS
40.0000 mg | ORAL_TABLET | Freq: Every day | ORAL | Status: DC
  Administered 2011-05-08 – 2011-05-12 (×5): 40 mg via ORAL
  Filled 2011-05-07 (×6): qty 1

## 2011-05-07 MED ORDER — BUPROPION HCL ER (XL) 150 MG PO TB24
150.0000 mg | ORAL_TABLET | Freq: Every day | ORAL | Status: DC
  Administered 2011-05-08: 150 mg via ORAL
  Filled 2011-05-07: qty 1

## 2011-05-07 MED ORDER — ALPRAZOLAM 0.5 MG PO TABS
1.0000 mg | ORAL_TABLET | Freq: Three times a day (TID) | ORAL | Status: DC | PRN
  Administered 2011-05-08 – 2011-05-11 (×4): 1 mg via ORAL
  Filled 2011-05-07: qty 2
  Filled 2011-05-07 (×2): qty 1
  Filled 2011-05-07 (×3): qty 2

## 2011-05-07 MED ORDER — HYDROCODONE-ACETAMINOPHEN 5-325 MG PO TABS
1.0000 | ORAL_TABLET | Freq: Four times a day (QID) | ORAL | Status: DC | PRN
  Administered 2011-05-08: 2 via ORAL
  Administered 2011-05-09: 1 via ORAL
  Administered 2011-05-09: 2 via ORAL
  Administered 2011-05-12 – 2011-05-13 (×2): 1 via ORAL
  Filled 2011-05-07: qty 2
  Filled 2011-05-07 (×3): qty 1
  Filled 2011-05-07: qty 2
  Filled 2011-05-07: qty 1

## 2011-05-07 MED ORDER — TIOTROPIUM BROMIDE MONOHYDRATE 18 MCG IN CAPS
18.0000 ug | ORAL_CAPSULE | Freq: Every day | RESPIRATORY_TRACT | Status: DC
  Administered 2011-05-10 – 2011-05-11 (×2): 18 ug via RESPIRATORY_TRACT
  Filled 2011-05-07: qty 5

## 2011-05-07 MED ORDER — WARFARIN SODIUM 2 MG PO TABS
2.0000 mg | ORAL_TABLET | Freq: Once | ORAL | Status: AC
  Administered 2011-05-07: 2 mg via ORAL
  Filled 2011-05-07: qty 1

## 2011-05-07 MED ORDER — ACETAMINOPHEN 325 MG PO TABS
650.0000 mg | ORAL_TABLET | Freq: Four times a day (QID) | ORAL | Status: DC | PRN
  Administered 2011-05-08 – 2011-05-11 (×3): 650 mg via ORAL
  Filled 2011-05-07 (×3): qty 2

## 2011-05-07 MED ORDER — ACETAMINOPHEN 650 MG RE SUPP: 650.0000 mg | Freq: Four times a day (QID) | RECTAL | Status: DC | PRN

## 2011-05-07 MED ORDER — LORATADINE 10 MG PO TABS
10.0000 mg | ORAL_TABLET | Freq: Every day | ORAL | Status: DC
  Administered 2011-05-08 – 2011-05-13 (×6): 10 mg via ORAL
  Filled 2011-05-07 (×6): qty 1

## 2011-05-07 MED ORDER — DULOXETINE HCL 30 MG PO CPEP
30.0000 mg | ORAL_CAPSULE | Freq: Every day | ORAL | Status: DC
  Administered 2011-05-08: 30 mg via ORAL
  Filled 2011-05-07: qty 1

## 2011-05-07 MED ORDER — FLUTICASONE-SALMETEROL 250-50 MCG/DOSE IN AEPB
1.0000 | INHALATION_SPRAY | Freq: Two times a day (BID) | RESPIRATORY_TRACT | Status: DC
  Administered 2011-05-08 – 2011-05-12 (×7): 1 via RESPIRATORY_TRACT
  Filled 2011-05-07: qty 14

## 2011-05-07 MED ORDER — FUROSEMIDE 80 MG PO TABS
80.0000 mg | ORAL_TABLET | Freq: Two times a day (BID) | ORAL | Status: DC
  Administered 2011-05-08 – 2011-05-12 (×10): 80 mg via ORAL
  Filled 2011-05-07 (×11): qty 1

## 2011-05-07 MED ORDER — POTASSIUM CHLORIDE CRYS ER 20 MEQ PO TBCR
20.0000 meq | EXTENDED_RELEASE_TABLET | Freq: Three times a day (TID) | ORAL | Status: DC
  Administered 2011-05-08 – 2011-05-13 (×17): 20 meq via ORAL
  Filled 2011-05-07 (×20): qty 1

## 2011-05-07 MED ORDER — ASPIRIN EC 81 MG PO TBEC
81.0000 mg | DELAYED_RELEASE_TABLET | Freq: Every day | ORAL | Status: DC
  Administered 2011-05-08 – 2011-05-11 (×4): 81 mg via ORAL
  Administered 2011-05-12: 81 mg
  Administered 2011-05-13: 81 mg via ORAL
  Filled 2011-05-07 (×6): qty 1

## 2011-05-07 MED ORDER — GABAPENTIN 300 MG PO CAPS
300.0000 mg | ORAL_CAPSULE | Freq: Two times a day (BID) | ORAL | Status: DC
  Administered 2011-05-08 – 2011-05-13 (×12): 300 mg via ORAL
  Filled 2011-05-07 (×13): qty 1

## 2011-05-07 MED ORDER — ALBUTEROL 90 MCG/ACT IN AERS: 1.0000 | INHALATION_SPRAY | RESPIRATORY_TRACT | Status: DC | PRN

## 2011-05-07 MED ORDER — SODIUM CHLORIDE 0.9 % IJ SOLN
3.0000 mL | Freq: Two times a day (BID) | INTRAMUSCULAR | Status: DC
  Administered 2011-05-08 – 2011-05-13 (×12): 3 mL via INTRAVENOUS

## 2011-05-07 MED ORDER — INSULIN DETEMIR 100 UNIT/ML ~~LOC~~ SOLN
45.0000 [IU] | Freq: Every day | SUBCUTANEOUS | Status: DC
  Administered 2011-05-08 – 2011-05-12 (×6): 45 [IU] via SUBCUTANEOUS
  Filled 2011-05-07: qty 10

## 2011-05-07 MED ORDER — AMIODARONE HCL 200 MG PO TABS
200.0000 mg | ORAL_TABLET | Freq: Every day | ORAL | Status: DC
  Administered 2011-05-08 – 2011-05-13 (×6): 200 mg via ORAL
  Filled 2011-05-07 (×6): qty 1

## 2011-05-07 MED ORDER — AMIODARONE HCL 200 MG PO TABS: 200.0000 mg | ORAL_TABLET | Freq: Every day | ORAL | Status: DC

## 2011-05-07 MED ORDER — PANTOPRAZOLE SODIUM 40 MG PO TBEC
80.0000 mg | DELAYED_RELEASE_TABLET | Freq: Two times a day (BID) | ORAL | Status: DC
  Administered 2011-05-08 – 2011-05-13 (×11): 80 mg via ORAL
  Filled 2011-05-07 (×2): qty 2
  Filled 2011-05-07: qty 1
  Filled 2011-05-07 (×2): qty 2
  Filled 2011-05-07 (×2): qty 1
  Filled 2011-05-07 (×4): qty 2

## 2011-05-07 MED ORDER — ALBUTEROL SULFATE HFA 108 (90 BASE) MCG/ACT IN AERS
1.0000 | INHALATION_SPRAY | RESPIRATORY_TRACT | Status: DC | PRN
  Filled 2011-05-07: qty 6.7

## 2011-05-07 MED ORDER — WARFARIN - PHARMACIST DOSING INPATIENT: Freq: Every day | Status: DC

## 2011-05-07 MED ORDER — INSULIN ASPART 100 UNIT/ML ~~LOC~~ SOLN
0.0000 [IU] | Freq: Three times a day (TID) | SUBCUTANEOUS | Status: DC
  Administered 2011-05-08: 18:00:00 via SUBCUTANEOUS
  Administered 2011-05-08: 5 [IU] via SUBCUTANEOUS
  Administered 2011-05-08: 11 [IU] via SUBCUTANEOUS
  Administered 2011-05-09 (×2): 8 [IU] via SUBCUTANEOUS
  Administered 2011-05-09: 3 [IU] via SUBCUTANEOUS
  Administered 2011-05-10: 5 [IU] via SUBCUTANEOUS
  Administered 2011-05-10: 8 [IU] via SUBCUTANEOUS
  Administered 2011-05-10: 5 [IU] via SUBCUTANEOUS
  Administered 2011-05-11: 2 [IU] via SUBCUTANEOUS
  Administered 2011-05-11: 3 [IU] via SUBCUTANEOUS
  Administered 2011-05-11: 5 [IU] via SUBCUTANEOUS
  Administered 2011-05-12 (×2): 3 [IU] via SUBCUTANEOUS
  Administered 2011-05-13 (×2): 5 [IU] via SUBCUTANEOUS

## 2011-05-07 MED ORDER — INSULIN ASPART 100 UNIT/ML ~~LOC~~ SOLN
0.0000 [IU] | Freq: Every day | SUBCUTANEOUS | Status: DC
  Administered 2011-05-09 – 2011-05-12 (×3): 2 [IU] via SUBCUTANEOUS

## 2011-05-07 NOTE — ED Notes (Addendum)
Instructed to go to ED by [facility] for further eval. Pt c/o high CBG's 300-400 x 2-3 weeks, unable to get it under control. Also reports had a syncopal episode 2 nights ago, "I fell flat on my face, I woke up on the kitchen floor". Event was unwitnessed.  Denied CP, SOB, dizziness, palpitations, n/v/d, fever, cold, cough. Pt denies any injuries from syncopal episode. No bruising, abrasions noted.   Respirations even & unlabored, no distress. States breathing & generalized body pain due to fibromyalgia no worse than normal

## 2011-05-07 NOTE — ED Notes (Signed)
Pt states to read entry that was put in there by outpatient clinic.  Pt is here with hearing throbbing in ears, fell flat on face 2 nights ago, cannot breath, cannot where oxygen, chest Pain.  Pt had OHS 09/20/11.  Pt is jittery and having sob.

## 2011-05-07 NOTE — Telephone Encounter (Signed)
Spoke w/ dr Stann Mainland, agrees that pt needs to go to ED for evaluation

## 2011-05-07 NOTE — H&P (Signed)
Hospital Admission Note Date: 05/07/2011  Patient name: [name] Medical record number: [id number] Date of birth: [birth year] Age: 61 y.o. Gender: female PCP: [name], DO, DO  Medical Service: Levora Dredge  Attending physician:  [name]    1st Contact: Charlann Lange                Pager: [phone] 2nd Contact: Criss Alvine    Pager: [phone] After 5 pm or weekends: 1st Contact:      Pager: [phone] 2nd Contact:      Pager: [phone]  Chief Complaint: Fall and high blood sugars.  History of Present Illness: Pt is a 61 yo woman with complicated PMH with PVD, Carotid disease, Afib, MVR, DM2 who comes to the ER with chief complaint of fall 2 days before. She called outpatient clinic this afternoon with complaints of high sugars in 200s to 300s for past few days and mentioned about fall 2 days before.  She describes the incident as she woke up lying on the floor, on her face, in her kitchen about 2 AM on Monday morning. Does not remember much about anything prior to fall. Says she was definitely not nauseous, had no changes in vision or dizziness before the fall. Also when she woke up, she exactly new where she was and was not confused at all. She called her neighbor for help.  She refuses any post fall headache, focal weakness or numbness. She denies any recent fevers, chills, nausea vomiting, chest pain, shortness of breath, abdominal pain, diarrhea. Did not notice any blood in stool.  Also reports her CBGs running in 200s to 300s for past few days- since she missed one dose of Levemir. Also c/o hearing Heart Beat in her head.  Meds: No current facility-administered medications on file as of 05/07/2011.   Medications Prior to Admission  Medication Sig Dispense Refill  . albuterol (PROVENTIL,VENTOLIN) 90 MCG/ACT inhaler Inhale 1-2 puffs into the lungs every 4 (four) hours as needed.  17 g  11  . ALPRAZolam (XANAX) 1 MG tablet Take 1 tablet (1 mg total) by mouth 3 (three) times  daily as needed for sleep or anxiety.  90 tablet  3  . amiodarone (PACERONE) 200 MG tablet Take 1 tablet (200 mg total) by mouth daily.  30 tablet  12  . aspirin 81 MG tablet Take 81 mg by mouth daily.        Marland Kitchen buPROPion (WELLBUTRIN XL) 150 MG 24 hr tablet Take 1 tablet (150 mg total) by mouth daily.  30 tablet  0  . docusate sodium (COLACE) 100 MG capsule Take 1 capsule (100 mg total) by mouth 2 (two) times daily.  60 capsule  7  . DULoxetine (CYMBALTA) 30 MG capsule Take 3 tablets by mouth once daily  90 capsule  7  . Fluticasone-Salmeterol (ADVAIR DISKUS) 250-50 MCG/DOSE AEPB Inhale 1 puff into the lungs every 12 (twelve) hours.  1 each  11  . furosemide (LASIX) 80 MG tablet Take 1 tablet (80 mg total) by mouth 2 (two) times daily.  60 tablet  12  . gabapentin (NEURONTIN) 300 MG capsule Take 1 capsule (300 mg total) by mouth 2 (two) times daily.  60 capsule  5  . HYDROcodone-acetaminophen (NORCO) 5-325 MG per tablet 1-2 tablets every 6 hours as needed for pain. Do not take with alcohol, do no drive or operate heavy machinery if drowsiness occurs. Do not take more than instructed.  240 tablet  3  . insulin  aspart (NOVOLOG) 100 UNIT/ML injection 7 units to cover 3-4 carb choices each meal 2-3x a day Before meals. Add correction amount of Novolog to food Novolog if needed up to a total of 30 units per day: If blood sugar is: < 150= no correction, if 151-200 add 2 unit, if 201-250 add 4 units, if 251-300 add 6 units, if 301-350 add 8 units, if 351-400 add 10 units, if 401- 450 add 12 units, if > 450 add 14 units and please call office for instructions.  2 vial  5  . insulin detemir (LEVEMIR) 100 UNIT/ML injection Inject 45 Units into the skin at bedtime. DISPENSE 2 VIALS/ MONTH  20 mL  6  . loratadine (CLARITIN) 10 MG tablet Take 1 tablet (10 mg total) by mouth daily.  90 tablet  3  . omeprazole (PRILOSEC) 40 MG capsule Take 40 mg by mouth 2 (two) times daily.       . potassium chloride SA  (K-DUR,KLOR-CON) 20 MEQ tablet Take 1 tablet (20 mEq total) by mouth 3 (three) times daily.  90 tablet  12  . rosuvastatin (CRESTOR) 20 MG tablet Take 1 tablet (20 mg total) by mouth daily.  30 tablet  7  . warfarin (COUMADIN) 2 MG tablet TAKE AS DIRECTED BY ANTICOAGULATION CLINIC  35 tablet  3  . ACCU-CHEK FASTCLIX LANCETS MISC 1 each by Does not apply route 3 (three) times daily before meals. Dx code- 250.00  102 each  12  . glucose blood test strip Use to test blood sugar 3 times daily.  100 each  11  . Insulin Pen Needle 31G X 5 MM MISC Please check blood sugars 4 times daily. Before breakfast and all meals.  100 each  11  . Insulin Syringe-Needle U-100 (INSULIN SYRINGE .3CC/31GX5/16") 31G X 5/16" 0.3 ML MISC 1 Syringe by Does not apply route 2 (two) times daily at 10 AM and 5 PM.  100 each  11  . Insulin Syringe-Needle U-100 (INSULIN SYRINGE 1CC/30GX1/2") 30G X 1/2" 1 ML MISC 1 Container by Does not apply route 2 (two) times daily.  100 each  11  . Lancets Misc. (ACCU-CHEK FASTCLIX LANCET) KIT 1 each by Does not apply route 3 (three) times daily.  1 kit  0  . PRESCRIPTION MEDICATION 02 one liter as needed        Allergies: Ativan; Morphine; Percocet; and Tramadol hcl Past Medical History  Diagnosis Date  . Depression   . GERD (gastroesophageal reflux disease)     Followed by [name] // small bowel capsule endoscopy (11/2009) - minimal chronic inflammation, otherwise negative for  H.pylori, metaplasia, dysplasia  . Hypertension   . Hyperlipidemia   . Diabetes mellitus type II, uncontrolled     on insulin therapy  . COPD (chronic obstructive pulmonary disease) 2008    started home O2 (12/2010) // Prior significant smoker  // her CT of the chest there was no evidence of primary carcinoid tumor , scattered normal-sized mediastinal and hilar as well as axillary lymph nodes noted., no significant lymphadenopathy. COPD and emphysema with no acute cardiopulmonary disease noted , mild  cardiomegaly and solitary calcified  plaque  . Moderate to severe pulmonary hypertension     severe per TEE (08/2010) - Peak RV-RA gradient 42m Hg  . Mitral regurgitation     mild to moderate per TEE (08/2010)  . Shingles   . Anemia     Anemia panel (08/2009) - iron 21, TIBC 441, UIBC 420,  ferritin 11 // Pt has been unable to tolerate iron supplementation due to chronic abdominal discomfort, source of which still is not clear.  Marland Kitchen History of colonic polyps     Colonoscopy (10/2005) - colon polyp with path nonadenomatous, no malignancy - by [name]. // Colonoscopy (02/2002) - 2 polyps noted, 1 - adenomatous, 2 - hyperplastic // Followed by [name]  . Anxiety   . IBS (irritable bowel syndrome)   . Osteoporosis     DEXA (12/2004) - L-spine T score -2.6, Left hip -0.1  . Fibromyalgia   . Obstructive sleep apnea     on CPAP. // Sleep study (06/2009) - Moderate sleep apnea/ hypopnea syndrome , AHI 17.8 per hour with nonpositional hypopneas. CPAP titration to 12 CWP, AHI 2.4 per hour. Small resMed Quattro full-face mask with heated humidifier  . Restless leg syndrome   . Mitral stenosis     moderate per TEE (08/2010) // Mitral valve replacement with a 27-mm pericardial porcine valve (Medtronic Mosaic valve, serial #[id number]) on 09/20/2010 - [name] // Followed at [facility], Dr. Stanford Breed  . Bilateral carotid artery stenosis     s/p right endarterectomy (06/2010). // Carotid US (07/2010) -  60%-79% ICA stenosis, mid range of scale by velocity.  Left: Moderate-to-severe calcific and non-calcific plaque origin andproximal ICA and ECA.  60%-79% ICA stenosis, highest end of scale // Followed by [name].  . Diabetes mellitus   . Peripheral vascular disease   . Coronary artery disease    Past Surgical History  Procedure Date  . Orif clavicle fracture 01/2004    by Thana Farr. [name], M.D for Right clavicle nonunion.  . Tonsillectomy   . Carotid endarterectomy 07/04/2010     right, by [name] for asymptomatic right carotid artery stenosis  . Lipoma exision 08/2005    occipital lipoma 1.5cm - by Dr. [name]  . Hysteroscopy with endometrial ablation 06/2001    for persistent post-menopausal bleeding // by S. [name], M.D.  . Mitral valve replacement 09/20/10     with a 27-mm pericardial porcine valve (Medtronic Mosaic valve, serial #[id number]). 09/20/10, Dr Prescott Gum  . Left atrial maze procedure 09/20/10    for paroxysmal atrial fibrillation (Dr. Prescott Gum)  . Placement of left chest tube. 09/24/2010    Dr Prescott Gum  . Pr vein bypass graft,aorto-fem-pop May 2012  . Cardiac valve replacement Aug. 2012   Family History  Problem Relation Age of Onset  . Heart disease Father 31    died to MI at 62yo  . Heart disease Brother 62    died of MI at 89yo   History   Social History  . Marital Status: Divorced    Spouse Name: N/A    Number of Children: N/A  . Years of Education: N/A   Occupational History  . Not on file.   Social History Main Topics  . Smoking status: Former Smoker -- 1.0 packs/day for 50 years    Types: Cigarettes    Quit date: 07/12/2010  . Smokeless tobacco: Not on file  . Alcohol Use: No  . Drug Use: No  . Sexually Active: No   Other Topics Concern  . Not on file   Social History Narrative  . No narrative on file    Review of Systems: As per HPI. 12 point ROS done and negative except HPI  Physical Exam: Blood pressure 119/56, pulse 77, temperature 98.6 F (37 C), temperature source Oral, resp. rate  19, SpO2 100.00%. Constitutional: Vital signs reviewed.  Patient is a well-developed and well-nourished in no acute distress and cooperative with exam. Alert and oriented x3.  Head: Normocephalic and atraumatic Mouth: no erythema or exudates, MMM Eyes: PERRL, EOMI, conjunctivae normal, No scleral icterus.  Neck: Supple, Trachea midline normal ROM, No JVD. Carotid Bruit present B/L Cardiovascular: RRR, S1 normal, S2 normal,  systolic murmur. Pulmonary/Chest: CTAB, no wheezes, rales, or rhonchi Abdominal: Soft. Non-tender, non-distended, bowel sounds are normal, no masses, organomegaly, or guarding present.  Extremeties: 1+ pitting edema B/L Musculoskeletal: No joint deformities, erythema, or stiffness, ROM full and no nontender Neurological: A&O x3, Strenght is normal and symmetric bilaterally, cranial nerve II-XII are grossly intact, no focal motor deficit, sensory intact to light touch bilaterally.  Skin: Warm, dry and intact. No rash, cyanosis, or clubbing.     Lab results: Basic Metabolic Panel:  Basename 05/07/11 1518  NA 136  K 3.8  CL 97  CO2 27  GLUCOSE 213*  BUN 22  CREATININE 0.92  CALCIUM 9.6  MG --  PHOS --   Liver Function Tests:  Basename 05/07/11 1518  AST 15  ALT 20  ALKPHOS 66  BILITOT 0.2*  PROT 7.3  ALBUMIN 3.9   CBC:  Basename 05/07/11 1518  WBC 6.8  NEUTROABS --  HGB 7.1*  HCT 24.5*  MCV 78.3  PLT 249   Cardiac Enzymes:  Basename 05/07/11 1715  CKTOTAL --  CKMB --  CKMBINDEX --  TROPONINI <0.30   CBG:  Basename 05/07/11 1523  GLUCAP 280*   Coagulation:  Basename 05/07/11 1715  LABPROT 27.9*  INR 2.56*   Urine Drug Screen: Drugs of Abuse     Component Value Date/Time   LABOPIA POSITIVE* 03/27/2009 0133   COCAINSCRNUR NONE DETECTED 03/27/2009 0133   LABBENZ POSITIVE* 03/27/2009 0133   AMPHETMU NONE DETECTED 03/27/2009 0133   THCU NONE DETECTED 03/27/2009 0133   LABBARB  Value: NONE DETECTED        DRUG SCREEN FOR MEDICAL PURPOSES ONLY.  IF CONFIRMATION IS NEEDED FOR ANY PURPOSE, NOTIFY LAB WITHIN 5 DAYS.        LOWEST DETECTABLE LIMITS FOR URINE DRUG SCREEN Drug Class       Cutoff (ng/mL) Amphetamine      1000 Barbiturate      200 Benzodiazepine   478 Tricyclics       295 Opiates          300 Cocaine          300 THC              50 03/27/2009 0133      Imaging results:  Dg Chest 2 View  05/07/2011  *RADIOLOGY REPORT*  Clinical Data: Shortness of  breath.  CHEST - 2 VIEW  Comparison: 11/13/2010.  Findings: The cardiac silhouette, mediastinal and hilar contours are within normal limits and stable.  Stable mild elevation of the left hemidiaphragm with overlying atelectasis. No pneumothorax.  IMPRESSION: Persistent elevation of the left hemidiaphragm with overlying atelectasis.  Original Report Authenticated By: P. Kalman Jewels, M.D.    Other results: EKG: Sinus rhythm, LAD, 1st degree block.  Assessment & Plan by Problem:  1) Fall/Syncope: Unclear about etiology and characteristic of fall. Has frequent falls since Cardiac Surgery in 09/2010. Has no focal neurological abnormalities-  CVA low on list. Most Probably had fall due to Anemia( and neurocardiogenic effects from low Hb). Hb- 7.1.  Plan: Admit to tele. - Currently not in  A-fib. - Transfuse 2 Units PRBC - Continue follow CBC  - Will hold off CT head for now- will discuss with Dr. Eppie Gibson - INR- 2.5- not supra therapeutic( low risk of bleeding in head) - will need if any neuro symptoms.  2) Anemia: Iron Deficiency Anemia - Ferritin 11 in 7/11. - EGD 5/07 - normal - Colonoscopy- 1/04: Adenomatous and Hyperplastic ployp. Not on oral Iron due to abdominal discomfort.  3) Paroxysmal A- Fib: Currently in NSR. S/p LA Maze procedure 09/2010. - Continue Amiodarone 200 mg daily and Coumadin 2 mg daily. - Tele monitor.  4) Carotid Artery Disease: S/p R endarterectomy 07/2010. Carotid Doppler- 12/12- R Carotid 40-59% stenoses and L 60-79%. - Followed by [facility]- next Appt in July.  5) DM 2: Fairly controlled. Last A1c 8.5 in 2/13 Continue home Levemir and start SSI moderate.  6) COPD: Continue O2 and home inhalers.  7) CHF- diastolic: Grade 2. - Continue Lasix.  8) HTN: Cont home meds. BP stable.  DVT PPX: On Coumadin- INR 2.56.      Signed: Kendrick Remigio 05/07/2011, 7:47 PM

## 2011-05-07 NOTE — ED Notes (Signed)
Pt placed in POD A room 2

## 2011-05-07 NOTE — Telephone Encounter (Signed)
I agree as well. Thank you for helping to take care of my patient.  [name], D.O.

## 2011-05-07 NOTE — Telephone Encounter (Signed)
Patient called saying she thinks she missed levemir one day  Around March 7th and now cannot get blood sugars controlled. ( March 8th: 310, 431, 236,  05/06/11: did not check it in the am,  took 15 units novolog because that is what she has been taking- checked at 6 pm  And it was 390, Today 378-fasting, took 17 units novolog, ate small bowl of shredded wheat, went back to sleep and it is 385 now. )  She reports no cold or symptoms of infection, but does report being Weak tired, legs wobbling, arms trembling- mentioned this to doctor last visit. Not predictable, doesn't know what causes it, and a new thing- hears heartbeat in head (described as maybe around her temples, sounds like a normal heartbeat). Has been happening for the past few weeks.   cannot wear oxygen during day because it is making her nosebleed, bit does wear it when she sleeps. Tired of feeling bad, feels like a hypochondriack;  Fell 2 nights ago - does not know why, is okay- doesn't remember falling, was laying down on kitchen floor face down when she realized what had happened. Not sure if she fell asleep standing up.   Dr Guy Sandifer is in clinic this month, send to attending to see if patient can wait until April to be seen. Please schedule with CDE on same day if appropriate and an afternoon appointment with knowledge of how long visit will take 2 day notice to schedule scat.  Marland Kitchen

## 2011-05-07 NOTE — Progress Notes (Signed)
ANTICOAGULATION CONSULT NOTE - Initial Consult  Pharmacy Consult for coumadin Indication: atrial fibrillation and MVR  Allergies  Allergen Reactions  . Ativan     Patient's sister noted that ativan caused the patient to become extremely confused during hospitalization 09/2010  . Morphine     REACTION: Unknown reaction  . Percocet (Oxycodone-Acetaminophen)     headache  . Tramadol Hcl     REACTION: swelling   Vital Signs: Temp: 97.8 F (36.6 C) (03/27 2002) Temp src: Oral (03/27 2002) BP: 142/42 mmHg (03/27 2002) Pulse Rate: 80  (03/27 2002)  Labs:  [facility] 05/07/11 1715 05/07/11 1518  HGB -- 7.1*  HCT -- 24.5*  PLT -- 249  APTT -- --  LABPROT 27.9* --  INR 2.56* --  HEPARINUNFRC -- --  CREATININE -- 0.92  CKTOTAL -- --  CKMB -- --  TROPONINI <0.30 --   The CrCl is unknown because both a height and weight (above a minimum accepted value) are required for this calculation.  Medical History: Past Medical History  Diagnosis Date  . Depression   . GERD (gastroesophageal reflux disease)     Followed by [name] // small bowel capsule endoscopy (11/2009) - minimal chronic inflammation, otherwise negative for  H.pylori, metaplasia, dysplasia  . Hypertension   . Hyperlipidemia   . Diabetes mellitus type II, uncontrolled     on insulin therapy  . COPD (chronic obstructive pulmonary disease) 2008    started home O2 (12/2010) // Prior significant smoker  // her CT of the chest there was no evidence of primary carcinoid tumor , scattered normal-sized mediastinal and hilar as well as axillary lymph nodes noted., no significant lymphadenopathy. COPD and emphysema with no acute cardiopulmonary disease noted , mild cardiomegaly and solitary calcified  plaque  . Moderate to severe pulmonary hypertension     severe per TEE (08/2010) - Peak RV-RA gradient 19m Hg  . Mitral regurgitation     mild to moderate per TEE (08/2010)  . Shingles   . Anemia     Anemia panel (08/2009) -  iron 21, TIBC 441, UIBC 420,  ferritin 11 // Pt has been unable to tolerate iron supplementation due to chronic abdominal discomfort, source of which still is not clear.  .Marland KitchenHistory of colonic polyps     Colonoscopy (10/2005) - colon polyp with path nonadenomatous, no malignancy - by Dr. [name] // Colonoscopy (02/2002) - 2 polyps noted, 1 - adenomatous, 2 - hyperplastic // Followed by Dr. [name] . Anxiety   . IBS (irritable bowel syndrome)   . Osteoporosis     DEXA (12/2004) - L-spine T score -2.6, Left hip -0.1  . Fibromyalgia   . Obstructive sleep apnea     on CPAP. // Sleep study (06/2009) - Moderate sleep apnea/ hypopnea syndrome , AHI 17.8 per hour with nonpositional hypopneas. CPAP titration to 12 CWP, AHI 2.4 per hour. Small resMed Quattro full-face mask with heated humidifier  . Restless leg syndrome   . Mitral stenosis     moderate per TEE (08/2010) // Mitral valve replacement with a 27-mm pericardial porcine valve (Medtronic Mosaic valve, serial [id number] on 09/20/2010 - PIvin Poot// [name] . Bilateral carotid artery stenosis     s/p right endarterectomy (06/2010). // Carotid [place](07/2010) -  60%-79% ICA stenosis, mid range of scale by velocity.  Left: Moderate-to-severe calcific and non-calcific plaque origin andproximal ICA and ECA.  60%-79% ICA stenosis, highest end  of scale // Followed by [name].  . Diabetes mellitus   . Peripheral vascular disease   . Coronary artery disease    Assessment: 47 yof on chronic coumadin for afib and MVR. INR today is therapeutic at 2.56. No bleeding noted. Admitted s/p recent fall and elevated CBGs.   Goal of Therapy:  INR 2-3   Plan:  1. Coumadin 56m PO x 1 now 2. Daily INR  Marthena Whitmyer, RRande Lawman3/27/2013,8:09 PM

## 2011-05-07 NOTE — ED Notes (Signed)
Pt not in room presently

## 2011-05-07 NOTE — Telephone Encounter (Signed)
i spoke w/ pt, she confirms all the previous problems PLUS she is short of breath, she was ask to go to ED but would like for me to confirm this w/ attending first, she will arrange transportation and i will speak w/ attending

## 2011-05-07 NOTE — ED Provider Notes (Signed)
History    61 year old female referred to the emergency room for evaluation. Apparently she was referred over concern for a recent syncopal event. Patient states that 2 days ago she woke up face down on the floor. She is unsure how she got there. She called the outpatient clinics today over concern that her blood sugars have been higher than normal recently. Sugars in the 3 and 400s. When discussing the recent fall they're concerned and sent her to the ED. Patient is complaining of brought temporal region. Denies actual pain but states it feels like she her heartbeat in her head. More SOB than usual. Triage note was reviewed but patient specifically denied chest pain. Patient is on warfarin for history of valve replacement. No fevers or chills. No cough. Patient with chronic lower extremity edema, denies any acute change. Denies confusion it seems to be at her baseline per friend who is at bedside.  CSN: [id number]  Arrival date & time 05/07/11  1506   First MD Initiated Contact with Patient 05/07/11 [phone]      Chief Complaint  Patient presents with  . Shortness of Breath    (Consider location/radiation/quality/duration/timing/severity/associated sxs/prior treatment) HPI  Past Medical History  Diagnosis Date  . Depression   . GERD (gastroesophageal reflux disease)     Followed by [name] // small bowel capsule endoscopy (11/2009) - minimal chronic inflammation, otherwise negative for  H.pylori, metaplasia, dysplasia  . Hypertension   . Hyperlipidemia   . Diabetes mellitus type II, uncontrolled     on insulin therapy  . COPD (chronic obstructive pulmonary disease) 2008    started home O2 (12/2010) // Prior significant smoker  // her CT of the chest there was no evidence of primary carcinoid tumor , scattered normal-sized mediastinal and hilar as well as axillary lymph nodes noted., no significant lymphadenopathy. COPD and emphysema with no acute cardiopulmonary disease noted , mild  cardiomegaly and solitary calcified  plaque  . Moderate to severe pulmonary hypertension     severe per TEE (08/2010) - Peak RV-RA gradient 66m Hg  . Mitral regurgitation     mild to moderate per TEE (08/2010)  . Shingles   . Anemia     Anemia panel (08/2009) - iron 21, TIBC 441, UIBC 420,  ferritin 11 // Pt has been unable to tolerate iron supplementation due to chronic abdominal discomfort, source of which still is not clear.  .Marland KitchenHistory of colonic polyps     Colonoscopy (10/2005) - colon polyp with path nonadenomatous, no malignancy - by Dr. [name] // Colonoscopy (02/2002) - 2 polyps noted, 1 - adenomatous, 2 - hyperplastic // Followed by Dr. [name] . Anxiety   . IBS (irritable bowel syndrome)   . Osteoporosis     DEXA (12/2004) - L-spine T score -2.6, Left hip -0.1  . Fibromyalgia   . Obstructive sleep apnea     on CPAP. // Sleep study (06/2009) - Moderate sleep apnea/ hypopnea syndrome , AHI 17.8 per hour with nonpositional hypopneas. CPAP titration to 12 CWP, AHI 2.4 per hour. Small resMed Quattro full-face mask with heated humidifier  . Restless leg syndrome   . Mitral stenosis     moderate per TEE (08/2010) // Mitral valve replacement with a 27-mm pericardial porcine valve (Medtronic Mosaic valve, serial ##[id number] on 09/20/2010 - PIvin Poot// [name] . Bilateral carotid artery stenosis     s/p right endarterectomy (06/2010). // Carotid [place](07/2010) -  60%-79% ICA stenosis, mid range of scale by velocity.  Left: Moderate-to-severe calcific and non-calcific plaque origin andproximal ICA and ECA.  60%-79% ICA stenosis, highest end of scale // Followed by [name].  . Diabetes mellitus   . Peripheral vascular disease   . Coronary artery disease     Past Surgical History  Procedure Date  . Orif clavicle fracture 01/2004    by Thana Farr. [name], M.D for Right clavicle nonunion.  . Tonsillectomy   . Carotid endarterectomy 07/04/2010     right, by [name] for asymptomatic right carotid artery stenosis  . Lipoma exision 08/2005    occipital lipoma 1.5cm - by Dr. [name]  . Hysteroscopy with endometrial ablation 06/2001    for persistent post-menopausal bleeding // by S. [name], M.D.  . Mitral valve replacement 09/20/10     with a 27-mm pericardial porcine valve (Medtronic Mosaic valve, serial #[id number]). 09/20/10, Dr Prescott Gum  . Left atrial maze procedure 09/20/10    for paroxysmal atrial fibrillation (Dr. Prescott Gum)  . Placement of left chest tube. 09/24/2010    Dr Prescott Gum  . Pr vein bypass graft,aorto-fem-pop May 2012  . Cardiac valve replacement Aug. 2012    Family History  Problem Relation Age of Onset  . Heart disease Father 29    died to MI at 80yo  . Heart disease Brother 56    died of MI at 44yo    History  Substance Use Topics  . Smoking status: Former Smoker -- 1.0 packs/day for 50 years    Types: Cigarettes    Quit date: 07/12/2010  . Smokeless tobacco: Not on file  . Alcohol Use: No    OB History    Grav Para Term Preterm Abortions TAB SAB Ect Mult Living                  Review of Systems   Review of symptoms negative unless otherwise noted in HPI.   Allergies  Ativan; Morphine; Percocet; and Tramadol hcl  Home Medications   Current Outpatient Rx  Name Route Sig Dispense Refill  . ALBUTEROL 90 MCG/ACT IN AERS Inhalation Inhale 1-2 puffs into the lungs every 4 (four) hours as needed. 17 g 11  . ALPRAZOLAM 1 MG PO TABS Oral Take 1 tablet (1 mg total) by mouth 3 (three) times daily as needed for sleep or anxiety. 90 tablet 3  . AMIODARONE HCL 200 MG PO TABS Oral Take 1 tablet (200 mg total) by mouth daily. 30 tablet 12  . ASPIRIN 81 MG PO TABS Oral Take 81 mg by mouth daily.      . BUPROPION HCL ER (XL) 150 MG PO TB24 Oral Take 1 tablet (150 mg total) by mouth daily. 30 tablet 0  . DOCUSATE SODIUM 100 MG PO CAPS Oral Take 1 capsule (100 mg total) by mouth 2 (two) times daily.  60 capsule 7  . DULOXETINE HCL 30 MG PO CPEP  Take 3 tablets by mouth once daily 90 capsule 7  . FLUTICASONE-SALMETEROL 250-50 MCG/DOSE IN AEPB Inhalation Inhale 1 puff into the lungs every 12 (twelve) hours. 1 each 11  . FUROSEMIDE 80 MG PO TABS Oral Take 1 tablet (80 mg total) by mouth 2 (two) times daily. 60 tablet 12  . GABAPENTIN 300 MG PO CAPS Oral Take 1 capsule (300 mg total) by mouth 2 (two) times daily. 60 capsule 5  . HYDROCODONE-ACETAMINOPHEN 5-325 MG PO TABS  1-2 tablets every 6 hours  as needed for pain. Do not take with alcohol, do no drive or operate heavy machinery if drowsiness occurs. Do not take more than instructed. 240 tablet 3    DO NOT FILL UNTIL 04/23/2011. Refill 1 - DO NOT F ...  . INSULIN ASPART 100 UNIT/ML [place] SOLN  7 units to cover 3-4 carb choices each meal 2-3x a day Before meals. Add correction amount of Novolog to food Novolog if needed up to a total of 30 units per day: If blood sugar is: < 150= no correction, if 151-200 add 2 unit, if 201-250 add 4 units, if 251-300 add 6 units, if 301-350 add 8 units, if 351-400 add 10 units, if 401- 450 add 12 units, if > 450 add 14 units and please call office for instructions. 2 vial 5  . INSULIN DETEMIR 100 UNIT/ML [place] SOLN Subcutaneous Inject 45 Units into the skin at bedtime. DISPENSE 2 VIALS/ MONTH 20 mL 6  . LORATADINE 10 MG PO TABS Oral Take 1 tablet (10 mg total) by mouth daily. 90 tablet 3  . OMEPRAZOLE 40 MG PO CPDR Oral Take 40 mg by mouth 2 (two) times daily.     Marland Kitchen POTASSIUM CHLORIDE CRYS ER 20 MEQ PO TBCR Oral Take 1 tablet (20 mEq total) by mouth 3 (three) times daily. 90 tablet 12  . ROSUVASTATIN CALCIUM 20 MG PO TABS Oral Take 1 tablet (20 mg total) by mouth daily. 30 tablet 7  . TIOTROPIUM BROMIDE MONOHYDRATE 18 MCG IN CAPS Inhalation Place 18 mcg into inhaler and inhale daily.    . WARFARIN SODIUM 2 MG PO TABS  TAKE AS DIRECTED BY ANTICOAGULATION CLINIC 35 tablet 3  . ACCU-CHEK FASTCLIX LANCETS MISC Does not apply  1 each by Does not apply route 3 (three) times daily before meals. Dx code- 250.00 102 each 12  . GLUCOSE BLOOD VI STRP  Use to test blood sugar 3 times daily. 100 each 11    One Touch Ultra test strips  . INSULIN PEN NEEDLE 31G X 5 MM MISC  Please check blood sugars 4 times daily. Before breakfast and all meals. 100 each 11  . INSULIN SYRINGE 31G X 5/16" 0.3 ML MISC Does not apply 1 Syringe by Does not apply route 2 (two) times daily at 10 AM and 5 PM. 100 each 11  . INSULIN SYRINGE 30G X 1/2" 1 ML MISC Does not apply 1 Container by Does not apply route 2 (two) times daily. 100 each 11  . ACCU-CHEK FASTCLIX LANCET KIT Does not apply 1 each by Does not apply route 3 (three) times daily. 1 kit 0  . PRESCRIPTION MEDICATION  02 one liter as needed      BP 132/43  Pulse 76  Temp(Src) 97.6 F (36.4 C) (Oral)  Resp 15  SpO2 100%  Physical Exam  Nursing note and vitals reviewed. Constitutional: She is oriented to person, place, and time. She appears well-developed and well-nourished. No distress.       Sitting up in bed. No acute distress.  HENT:  Head: Normocephalic and atraumatic.  Eyes: Conjunctivae and EOM are normal. Pupils are equal, round, and reactive to light. Right eye exhibits no discharge. Left eye exhibits no discharge.  Neck: Normal range of motion. Neck supple.  Cardiovascular: Normal rate and regular rhythm.  Exam reveals no gallop and no friction rub.   Murmur heard.      Faint systolic murmur. Normal rate and regular rhythm.  Pulmonary/Chest: Effort normal  and breath sounds normal. No respiratory distress.  Abdominal: Soft. She exhibits no distension. There is no tenderness.  Genitourinary:       Small pararectal skin tags. No hemorrhoids noted. Normal rectal tone. Soft brown stool. Heme negative. No rectal tenderness or masses palpated.  Musculoskeletal: She exhibits edema. She exhibits no tenderness.       No midline spinal tenderness. Lower extremity shows symmetric as  compared to each other. There is mild pitting bilateral lower extremity edema. No calf tenderness.  Neurological: She is alert and oriented to person, place, and time. No cranial nerve deficit. She exhibits normal muscle tone. Coordination normal.       Good finger to nose testing bilaterally. Good rapid alternating finger movements. Speech is clear and content appropriate. GCS 15  Skin: Skin is warm and dry. She is not diaphoretic.  Psychiatric: She has a normal mood and affect. Her behavior is normal. Thought content normal.    ED Course  Procedures (including critical care time)  Labs Reviewed  GLUCOSE, CAPILLARY - Abnormal; Notable for the following:    Glucose-Capillary 280 (*)    All other components within normal limits  COMPREHENSIVE METABOLIC PANEL  CBC  DIFFERENTIAL  TROPONIN I  PROTIME-INR   Dg Chest 2 View  05/07/2011  *RADIOLOGY REPORT*  Clinical Data: Shortness of breath.  CHEST - 2 VIEW  Comparison: 11/13/2010.  Findings: The cardiac silhouette, mediastinal and hilar contours are within normal limits and stable.  Stable mild elevation of the left hemidiaphragm with overlying atelectasis. No pneumothorax.  IMPRESSION: Persistent elevation of the left hemidiaphragm with overlying atelectasis.  Original Report Authenticated By: P. Kalman Jewels, M.D.   EKG:  Rhythm: Normal sinus rhythm Rate: 75 Axis: Normal axis Intervals: First degree AV block ST segments: Nonspecific ST changes. There is T-wave flattening inferiorly   1. Fibromyalgia   2. Diabetes mellitus type II   3. Diabetic peripheral neuropathy   4. Anxiety   5. Anemia   6. HTN (hypertension)   7. GERD (gastroesophageal reflux disease)   8. DM2 (diabetes mellitus, type 2)   9. HYPERTENSION   10. COPD (chronic obstructive pulmonary disease)   11. Cramps, muscle, general   12. Atrial fibrillation   13. Falls frequently       MDM  61 year old female with a syncopal event 2 days ago. Patient with no  acute complaints aside from pulsating sensation in her head. She is nonfocal neurological examination. Neuroimaging was considered but deferred. Patient is on Coumadin and at increased risk for bleeding. Given that patient is denying having a headache, a nonfocal neurological exam, in no progressive symptoms for the past 2 days my suspicion for a clinically significant head bleed is very low. Workup significant for anemia of 7.1.         Virgel Manifold, MD 05/11/11 [phone]

## 2011-05-07 NOTE — ED Notes (Signed)
Report received from  [facility]

## 2011-05-07 NOTE — ED Notes (Signed)
MD at bedside.

## 2011-05-08 ENCOUNTER — Encounter (HOSPITAL_COMMUNITY): Payer: Self-pay | Admitting: *Deleted

## 2011-05-08 DIAGNOSIS — D649 Anemia, unspecified: Secondary | ICD-10-CM

## 2011-05-08 DIAGNOSIS — Z9181 History of falling: Secondary | ICD-10-CM

## 2011-05-08 DIAGNOSIS — IMO0001 Reserved for inherently not codable concepts without codable children: Secondary | ICD-10-CM

## 2011-05-08 LAB — GLUCOSE, CAPILLARY
Glucose-Capillary: 202 mg/dL — ABNORMAL HIGH (ref 70–99)
Glucose-Capillary: 304 mg/dL — ABNORMAL HIGH (ref 70–99)
Glucose-Capillary: 252 mg/dL — ABNORMAL HIGH (ref 70–99)
Glucose-Capillary: 182 mg/dL — ABNORMAL HIGH (ref 70–99)

## 2011-05-08 LAB — CBC
HCT: 28.8 % — ABNORMAL LOW (ref 36.0–46.0)
Hemoglobin: 8.8 g/dL — ABNORMAL LOW (ref 12.0–15.0)
MCH: 24.2 pg — ABNORMAL LOW (ref 26.0–34.0)
MCHC: 30.6 g/dL (ref 30.0–36.0)
MCV: 79.1 fL (ref 78.0–100.0)
Platelets: 239 10*3/uL (ref 150–400)
RBC: 3.64 MIL/uL — ABNORMAL LOW (ref 3.87–5.11)
RDW: 17.1 % — ABNORMAL HIGH (ref 11.5–15.5)
WBC: 8.4 10*3/uL (ref 4.0–10.5)

## 2011-05-08 LAB — PREPARE RBC (CROSSMATCH)

## 2011-05-08 LAB — PROTIME-INR
INR: 2.62 — ABNORMAL HIGH (ref 0.00–1.49)
Prothrombin Time: 28.4 seconds — ABNORMAL HIGH (ref 11.6–15.2)

## 2011-05-08 MED ORDER — BUPROPION HCL ER (XL) 150 MG PO TB24
150.0000 mg | ORAL_TABLET | Freq: Every day | ORAL | Status: DC
  Administered 2011-05-09 – 2011-05-13 (×5): 150 mg via ORAL
  Filled 2011-05-08 (×5): qty 1

## 2011-05-08 MED ORDER — DULOXETINE HCL 60 MG PO CPEP
60.0000 mg | ORAL_CAPSULE | Freq: Once | ORAL | Status: AC
  Administered 2011-05-08: 60 mg via ORAL
  Filled 2011-05-08: qty 1

## 2011-05-08 MED ORDER — DULOXETINE HCL 30 MG PO CPEP
90.0000 mg | ORAL_CAPSULE | Freq: Every day | ORAL | Status: DC
  Administered 2011-05-09 – 2011-05-13 (×5): 90 mg via ORAL
  Filled 2011-05-08 (×5): qty 1

## 2011-05-08 MED ORDER — WARFARIN SODIUM 2 MG PO TABS
2.0000 mg | ORAL_TABLET | Freq: Once | ORAL | Status: DC
  Filled 2011-05-08: qty 1

## 2011-05-08 NOTE — Progress Notes (Signed)
Subjective: Patient feels ok except for mild SOB especially with exertion. She reports that she was unable to sleep last night but denies PND.  No other c/o. Denies dark tarry stools or any rectal bleeding.  Received 2 units PRBC last night without problems per nursing report.  Patient described that she noticed some legs tremors since last year and felt "wobbly" when walking, and she thinks that her legs tremors contribute to her fall. Patient also endorses chronic dizziness unrelated to postures since last year worsening with her Vicodin tablets(for FM).   Objective: Vital signs in last 24 hours: Filed Vitals:   05/08/11 0500 05/08/11 0956 05/08/11 0957 05/08/11 0958  BP:  133/55 113/54 122/69  Pulse:  73 69 80  Temp:  97.5 F (36.4 C)    TempSrc:  Oral    Resp:  20    Height: _0  (1.575 m)     Weight: 196 lb 10.4 oz (89.2 kg)     SpO2: 93%      Weight change:   Intake/Output Summary (Last 24 hours) at 05/08/11 1019 Last data filed at 05/08/11 0958  Gross per 24 hour  Intake 2031.67 ml  Output      0 ml  Net 2031.67 ml   Physical Exam: General: NAD, calm and pleasant Neck: Supple,No JVD. Carotid Bruit present B/L  Cardiovascular: RRR, S1 normal, S2 normal, systolic murmur.  Pulmonary/Chest: CTAB, no wheezes, rales, or rhonchi  Abdominal: Soft. Non-tender, non-distended, bowel sounds are normal, no masses, organomegaly, or guarding present.  Extremeties: 1+ pitting edema B/L Musculoskeletal: No joint deformities, erythema, or stiffness, ROM full and no nontender Neurological: A&O x3, Strenght is normal and symmetric bilaterally, cranial nerve II-XII are grossly intact, no focal motor deficit, sensory intact to light touch bilaterally.  Skin: Warm, dry and intact. No rash, cyanosis, or clubbing.   Lab Results: Basic Metabolic Panel:  Lab 25/05/39 1518  [name] 136  K 3.8  CL 97  CO2 27  GLUCOSE 213*  BUN 22  CREATININE 0.92  CALCIUM 9.6  MG --  PHOS --   Liver  Function Tests:  Lab 05/07/11 1518  AST 15  ALT 20  ALKPHOS 66  BILITOT 0.2*  PROT 7.3  ALBUMIN 3.9    CBC:  Lab 05/08/11 0525 05/07/11 1518  WBC 8.4 6.8  NEUTROABS -- --  HGB 8.8* 7.1*  HCT 28.8* 24.5*  MCV 79.1 78.3  PLT 239 249   Cardiac Enzymes:  Lab 05/07/11 1715  CKTOTAL --  CKMB --  CKMBINDEX --  TROPONINI <0.30   CBG:  Lab 05/08/11 0827 05/07/11 2122 05/07/11 1523  GLUCAP 202* 182* 280*   Coagulation:  Lab 05/08/11 0525 05/07/11 1715  LABPROT 28.4* 27.9*  INR 2.62* 2.56*   Urine Drug Screen: Drugs of Abuse     Component Value Date/Time   LABOPIA POSITIVE* 03/27/2009 0133   COCAINSCRNUR NONE DETECTED 03/27/2009 0133   LABBENZ POSITIVE* 03/27/2009 0133   AMPHETMU NONE DETECTED 03/27/2009 0133   THCU NONE DETECTED 03/27/2009 0133   LABBARB  Value: NONE DETECTED        DRUG SCREEN FOR MEDICAL PURPOSES ONLY.  IF CONFIRMATION IS NEEDED FOR ANY PURPOSE, NOTIFY LAB WITHIN 5 DAYS.        LOWEST DETECTABLE LIMITS FOR URINE DRUG SCREEN Drug Class       Cutoff (ng/mL) Amphetamine      1000 Barbiturate      200 Benzodiazepine   767 Tricyclics  300 Opiates          300 Cocaine          300 THC              50 03/27/2009 0133    Studies/Results: Dg Chest 2 View  05/07/2011  *RADIOLOGY REPORT*  Clinical Data: Shortness of breath.  CHEST - 2 VIEW  Comparison: 11/13/2010.  Findings: The cardiac silhouette, mediastinal and hilar contours are within normal limits and stable.  Stable mild elevation of the left hemidiaphragm with overlying atelectasis. No pneumothorax.  IMPRESSION: Persistent elevation of the left hemidiaphragm with overlying atelectasis.  Original Report Authenticated By: P. Kalman Jewels, M.D.   Medications: I have reviewed the patient's current medications. Scheduled Meds:   . amiodarone  200 mg Oral Daily  . aspirin EC  81 mg Oral Daily  . atorvastatin  40 mg Oral q1800  . buPROPion  150 mg Oral Daily  . docusate sodium  100 mg Oral BID  .  DULoxetine  60 mg Oral Once  . DULoxetine  90 mg Oral Daily  . Fluticasone-Salmeterol  1 puff Inhalation BID  . furosemide  80 mg Oral BID  . gabapentin  300 mg Oral BID  . insulin aspart  0-15 Units Subcutaneous TID WC  . insulin aspart  0-5 Units Subcutaneous QHS  . insulin detemir  45 Units Subcutaneous QHS  . loratadine  10 mg Oral Daily  . pantoprazole  80 mg Oral BID AC  . potassium chloride SA  20 mEq Oral TID  . sodium chloride  3 mL Intravenous Q12H  . tiotropium  18 mcg Inhalation Daily  . warfarin  2 mg Oral Once  . warfarin  2 mg Oral ONCE-1800  . Warfarin - Pharmacist Dosing Inpatient   Does not apply q1800  . DISCONTD: amiodarone  200 mg Oral Daily  . DISCONTD: DULoxetine  30 mg Oral Daily   Continuous Infusions:  PRN Meds:.acetaminophen, acetaminophen, albuterol, ALPRAZolam, HYDROcodone-acetaminophen, DISCONTD: albuterol  Assessment and plan   Disposition: PT eval, likely short time SNF.  1) s/p Fall 2/2 Syncope:     The characteristic of her fall is unclear because patient does not remember much details associated with her fall. From what she described, she likely had syncope episode.      The etiology is most likely related to her moderate to severe anemia vs gait instability vs chronic dizziness a/w her narcotics.       For neurological reasons, no focal neurological deficit noted.  Denies any confusion post fall. No tongue biting injury or bowel/urinary incontinence. >>>>the likelihood of CVA or Seizures is very low. For cardiac reasons, denies any dizziness a/w neck turning. no significant abnormality noted on her EKG and her CE negative. No arrythmia noted on overnight monitoring. She has been followed up by her Cardiologist closely after her CABG/MVR. And her next appt is April 8 th.  She is negative for orthostatic hypotension.    Plan: - continue Tele - Falls precaution and PT therapy - correction of possible etiologies       For anemia>>>Hb 8.8 after 2  units PRBCs, FOBT negative, and GI consulted for ?EGD/ Colonoscopy       For chronic unsteady gait>>>no focal neuro deficit>>PT therapy       For ? Narcotics>>>patient would like to be on Vicotin for her FM abd she is allergic to percocet and tramadol. NSAIDS is not an option for now due to chronic coumadin therapy  and ? GIB>>>will discuss with patient about the safty measures.    2) Anemia: Iron Deficiency Anemia - Ferritin 11 in 7/11.  - unclear source of her anemia other than iron deficiency. Patient states that she has had 3-4 times blood transfusions in the past and she is till anemic  -FOBT negative on admission  -Hb 8.8 after 2 units PRBCs -consult GI Dr. Oletta Lamas today >>>will consider EGD/colonoscopy - Coumadin on hold now pending GI. (Coumadin is for her P. A fib and she is NSR now).  3) Paroxysmal A- Fib: Currently in NSR.  S/p LA Maze procedure 09/2010.  - continue Tele - Continue Amiodarone 200 mg daily - hold coumadin for now due to ? EGD/ Colonoscopy  4) Carotid Artery Disease: S/p R endarterectomy 07/2010.  Carotid Doppler- 12/12- R Carotid 40-59% stenoses and L 60-79%.  - Followed by [facility]- next Appt in July.   5) DM 2: Fairly controlled. Last A1c 8.5 in 2/13  Continue home Levemir and start SSI moderate.   6) COPD: Continue O2 and home inhalers.   7) CHF- diastolic: Grade 2.  - Continue Lasix.   8) HTN: Cont home meds.  9) HLD   statin    LOS: 1 day   [name] 05/08/2011, 10:19 AM

## 2011-05-08 NOTE — Progress Notes (Signed)
Pt arrived  To  Floor   Via  Stretcher    Placed on  Tele   Unit  prbc  infusinf  Via  Right  Arm    / alert orientedn  X 4    Voicing  No  Complaints  At  Present   Time

## 2011-05-08 NOTE — Consult Note (Signed)
[facility] CONSULT Reason for consult: Anemia Referring Physician: Medical teaching service., PCP Dr.Kalia-Reynolds, cardiologist Dr. Stanford Breed, gastroenterologist Dr. [name] is an 61 y.o. female.  HPI: We were asked to see the patient because of anemia. She was admitted following a fall with high blood sugars around 300. She woke up on the floor. She has had no melena or hematochezia. She has had no hematemesis. She had no headaches or other sounds. She denies any blood in the stool at all nausea vomiting or abdominal pain. Her hemoglobin on admission was 7.1. Stools were obtained one time and were Hemoccult-negative.  The patient is a long-term patient of Dr. [name]. He has been performing colonoscopies for some time. She had an EGD in 2007 that was normal and colonoscopy revealing small hyperplastic-type polyps in 2007. She was due for followup colonoscopy in 2012 but this was delayed because of coronary artery disease. In August 2012 the patient underwent CABG with mitral valve replacement with porcine valve. Apparently following the surgery, she with atrial fibrillation that required anticoagulation and she states that she has been shocked twice and now is in sinus rhythm. Dr. Stanford Breed is requested that she remain on Coumadin. The patient has had a long history of diarrhea, IBS-type symptoms including cramping and incontinence. [name] has performed an extensive workup over the past several years that has included biopsies of the small bowel negative for celiac disease and biopsies of the colon negative for microscopic colitis. This patient had previous CTs and ultrasound. She was worked up for carcinoid syndrome due to diarrhea and cramping and twice had elevated urinary 5 HIAA. This was only a slightly but a small bowel capsule was obtained looking for a primary carcinoid tumor that was negative. It appears that an octreotide scan was entertained but never done. The  patient reports that she is still having diarrhea but is not nearly as bad as it used to be.  Past Medical History  Diagnosis Date  . Depression   . GERD (gastroesophageal reflux disease)     Followed by [name] // small bowel capsule endoscopy (11/2009) - minimal chronic inflammation, otherwise negative for  H.pylori, metaplasia, dysplasia  . Hypertension   . Hyperlipidemia   . Diabetes mellitus type II, uncontrolled     on insulin therapy  . COPD (chronic obstructive pulmonary disease) 2008    started home O2 (12/2010) // Prior significant smoker  // her CT of the chest there was no evidence of primary carcinoid tumor , scattered normal-sized mediastinal and hilar as well as axillary lymph nodes noted., no significant lymphadenopathy. COPD and emphysema with no acute cardiopulmonary disease noted , mild cardiomegaly and solitary calcified  plaque  . Moderate to severe pulmonary hypertension     severe per TEE (08/2010) - Peak RV-RA gradient 8m Hg  . Mitral regurgitation     mild to moderate per TEE (08/2010)  . Shingles   . Anemia     Anemia panel (08/2009) - iron 21, TIBC 441, UIBC 420,  ferritin 11 // Pt has been unable to tolerate iron supplementation due to chronic abdominal discomfort, source of which still is not clear.  .Marland KitchenHistory of colonic polyps     Colonoscopy (10/2005) - colon polyp with path nonadenomatous, no malignancy - by Dr. [name] // Colonoscopy (02/2002) - 2 polyps noted, 1 - adenomatous, 2 - hyperplastic // Followed by Dr. [name] . Anxiety   . IBS (irritable bowel syndrome)   .  Osteoporosis     DEXA (12/2004) - L-spine T score -2.6, Left hip -0.1  . Fibromyalgia   . Obstructive sleep apnea     on CPAP. // Sleep study (06/2009) - Moderate sleep apnea/ hypopnea syndrome , AHI 17.8 per hour with nonpositional hypopneas. CPAP titration to 12 CWP, AHI 2.4 per hour. Small resMed Quattro full-face mask with heated humidifier  . Restless leg syndrome   . Mitral  stenosis     moderate per TEE (08/2010) // Mitral valve replacement with a 27-mm pericardial porcine valve (Medtronic Mosaic valve, serial #[id number]) on 09/20/2010 - [name] // Followed at [facility], Dr. Stanford Breed  . Bilateral carotid artery stenosis     s/p right endarterectomy (06/2010). // Carotid US (07/2010) -  60%-79% ICA stenosis, mid range of scale by velocity.  Left: Moderate-to-severe calcific and non-calcific plaque origin andproximal ICA and ECA.  60%-79% ICA stenosis, highest end of scale // Followed by [name].  . Diabetes mellitus   . Peripheral vascular disease   . Coronary artery disease     Past Surgical History  Procedure Date  . Orif clavicle fracture 01/2004    by Thana Farr. [name], M.D for Right clavicle nonunion.  . Tonsillectomy   . Carotid endarterectomy 07/04/2010    right, by [name] for asymptomatic right carotid artery stenosis  . Lipoma exision 08/2005    occipital lipoma 1.5cm - by Dr. [name]  . Hysteroscopy with endometrial ablation 06/2001    for persistent post-menopausal bleeding // by S. [name], M.D.  . Mitral valve replacement 09/20/10     with a 27-mm pericardial porcine valve (Medtronic Mosaic valve, serial #[id number]). 09/20/10, Dr Prescott Gum  . Left atrial maze procedure 09/20/10    for paroxysmal atrial fibrillation (Dr. Prescott Gum)  . Placement of left chest tube. 09/24/2010    Dr Prescott Gum  . Pr vein bypass graft,aorto-fem-pop May 2012  . Cardiac valve replacement Aug. 2012    Family History  Problem Relation Age of Onset  . Heart disease Father 26    died to MI at 81yo  . Heart disease Brother 67    died of MI at 42yo    Social History:  reports that she quit smoking about 9 months ago. Her smoking use included Cigarettes. She has a 50 pack-year smoking history. She does not have any smokeless tobacco history on file. She reports that she does not drink alcohol or use illicit drugs.  Allergies:  Allergies    Allergen Reactions  . Ativan     Patient's sister noted that ativan caused the patient to become extremely confused during hospitalization 09/2010  . Morphine     REACTION: Unknown reaction  . Percocet (Oxycodone-Acetaminophen)     headache  . Tramadol Hcl     REACTION: swelling    Medications;    . amiodarone  200 mg Oral Daily  . aspirin EC  81 mg Oral Daily  . atorvastatin  40 mg Oral q1800  . buPROPion  150 mg Oral Daily  . docusate sodium  100 mg Oral BID  . DULoxetine  60 mg Oral Once  . DULoxetine  90 mg Oral Daily  . Fluticasone-Salmeterol  1 puff Inhalation BID  . furosemide  80 mg Oral BID  . gabapentin  300 mg Oral BID  . insulin aspart  0-15 Units Subcutaneous TID WC  . insulin aspart  0-5 Units Subcutaneous QHS  . insulin detemir  45 Units  Subcutaneous QHS  . loratadine  10 mg Oral Daily  . pantoprazole  80 mg Oral BID AC  . potassium chloride SA  20 mEq Oral TID  . sodium chloride  3 mL Intravenous Q12H  . tiotropium  18 mcg Inhalation Daily  . warfarin  2 mg Oral Once  . DISCONTD: amiodarone  200 mg Oral Daily  . DISCONTD: DULoxetine  30 mg Oral Daily  . DISCONTD: warfarin  2 mg Oral ONCE-1800  . DISCONTD: Warfarin - Pharmacist Dosing Inpatient   Does not apply q1800   PRN Meds acetaminophen, acetaminophen, albuterol, ALPRAZolam, HYDROcodone-acetaminophen, DISCONTD: albuterol Results for orders placed during the hospital encounter of 05/07/11 (from the past 48 hour(s))  COMPREHENSIVE METABOLIC PANEL     Status: Abnormal   Collection Time   05/07/11  3:18 PM      Component Value Range Comment   Sodium 136  135 - 145 (mEq/L)    Potassium 3.8  3.5 - 5.1 (mEq/L)    Chloride 97  96 - 112 (mEq/L)    CO2 27  19 - 32 (mEq/L)    Glucose, Bld 213 (*) 70 - 99 (mg/dL)    BUN 22  6 - 23 (mg/dL)    Creatinine, Ser 0.92  0.50 - 1.10 (mg/dL)    Calcium 9.6  8.4 - 10.5 (mg/dL)    Total Protein 7.3  6.0 - 8.3 (g/dL)    Albumin 3.9  3.5 - 5.2 (g/dL)    AST 15  0 -  37 (U/L)    ALT 20  0 - 35 (U/L)    Alkaline Phosphatase 66  39 - 117 (U/L)    Total Bilirubin 0.2 (*) 0.3 - 1.2 (mg/dL)    GFR calc non Af Amer 65 (*) >90 (mL/min)    GFR calc Af Amer 76 (*) >90 (mL/min)   CBC     Status: Abnormal   Collection Time   05/07/11  3:18 PM      Component Value Range Comment   WBC 6.8  4.0 - 10.5 (K/uL)    RBC 3.13 (*) 3.87 - 5.11 (MIL/uL)    Hemoglobin 7.1 (*) 12.0 - 15.0 (g/dL)    HCT 24.5 (*) 36.0 - 46.0 (%)    MCV 78.3  78.0 - 100.0 (fL)    MCH 22.7 (*) 26.0 - 34.0 (pg)    MCHC 29.0 (*) 30.0 - 36.0 (g/dL)    RDW 17.8 (*) 11.5 - 15.5 (%)    Platelets 249  150 - 400 (K/uL)   GLUCOSE, CAPILLARY     Status: Abnormal   Collection Time   05/07/11  3:23 PM      Component Value Range Comment   Glucose-Capillary 280 (*) 70 - 99 (mg/dL)   TROPONIN I     Status: Normal   Collection Time   05/07/11  5:15 PM      Component Value Range Comment   Troponin I <0.30  <0.30 (ng/mL)   PROTIME-INR     Status: Abnormal   Collection Time   05/07/11  5:15 PM      Component Value Range Comment   Prothrombin Time 27.9 (*) 11.6 - 15.2 (seconds)    INR 2.56 (*) 0.00 - 1.49    PREPARE RBC (CROSSMATCH)     Status: Normal   Collection Time   05/07/11  5:49 PM      Component Value Range Comment   Order Confirmation ORDER PROCESSED BY BLOOD BANK  TYPE AND SCREEN     Status: Normal (Preliminary result)   Collection Time   05/07/11  6:19 PM      Component Value Range Comment   ABO/RH(D) A NEG      Antibody Screen NEG      Sample Expiration 05/10/2011      Unit Number [id number]      Blood Component Type RED CELLS,LR      Unit division 00      Status of Unit ISSUED,FINAL      Transfusion Status OK TO TRANSFUSE      Crossmatch Result Compatible      Unit Number [id number]      Blood Component Type RED CELLS,LR      Unit division 00      Status of Unit ISSUED      Transfusion Status OK TO TRANSFUSE      Crossmatch Result Compatible      Unit Number [id number]       Blood Component Type RED CELLS,LR      Unit division 00      Status of Unit ALLOCATED      Transfusion Status OK TO TRANSFUSE      Crossmatch Result Compatible     OCCULT BLOOD, POC DEVICE     Status: Normal   Collection Time   05/07/11  6:30 PM      Component Value Range Comment   Fecal Occult Bld NEGATIVE     GLUCOSE, CAPILLARY     Status: Abnormal   Collection Time   05/07/11  9:22 PM      Component Value Range Comment   Glucose-Capillary 182 (*) 70 - 99 (mg/dL)    Comment 1 Notify RN      Comment 2 Documented in Chart     PREPARE RBC (CROSSMATCH)     Status: Normal   Collection Time   05/07/11 11:49 PM      Component Value Range Comment   Order Confirmation ORDER PROCESSED BY BLOOD BANK     PROTIME-INR     Status: Abnormal   Collection Time   05/08/11  5:25 AM      Component Value Range Comment   Prothrombin Time 28.4 (*) 11.6 - 15.2 (seconds)    INR 2.62 (*) 0.00 - 1.49    CBC     Status: Abnormal   Collection Time   05/08/11  5:25 AM      Component Value Range Comment   WBC 8.4  4.0 - 10.5 (K/uL)    RBC 3.64 (*) 3.87 - 5.11 (MIL/uL)    Hemoglobin 8.8 (*) 12.0 - 15.0 (g/dL)    HCT 28.8 (*) 36.0 - 46.0 (%)    MCV 79.1  78.0 - 100.0 (fL)    MCH 24.2 (*) 26.0 - 34.0 (pg)    MCHC 30.6  30.0 - 36.0 (g/dL)    RDW 17.1 (*) 11.5 - 15.5 (%)    Platelets 239  150 - 400 (K/uL)   GLUCOSE, CAPILLARY     Status: Abnormal   Collection Time   05/08/11  8:27 AM      Component Value Range Comment   Glucose-Capillary 202 (*) 70 - 99 (mg/dL)    Comment 1 Notify RN      Comment 2 Documented in Chart       Dg Chest 2 View  05/07/2011  *RADIOLOGY REPORT*  Clinical Data: Shortness of breath.  CHEST - 2 VIEW  Comparison: 11/13/2010.  Findings:  The cardiac silhouette, mediastinal and hilar contours are within normal limits and stable.  Stable mild elevation of the left hemidiaphragm with overlying atelectasis. No pneumothorax.  IMPRESSION: Persistent elevation of the left hemidiaphragm with  overlying atelectasis.  Original Report Authenticated By: P. Kalman Jewels, M.D.   ROS: GI: Please see H&P above. Patient still has some heartburn and some difficulty swallowing this has not been progressive. She still continues to have loose stool and cramps it is better than he used to be that she feels at this time is due to diarrhea secondary to IBS General: Generalized weakness and dizziness of unclear cause. HEENT: Headaches and sinus drainage Pulmonary: History of chronic COPD pulmonary hypertension chronic shortness of breath. Cardiovascular: Frequent tachycardia and shortness of breath with exertion. Previous CABG mitral valve replacement carotid endarterectomies et Ronney Asters. Other:           Blood pressure 111/40, pulse 75, temperature 97.5 F (36.4 C), temperature source Oral, resp. rate 20, height 5' 2" (1.575 m), weight 89.2 kg (196 lb 10.4 oz), SpO2 100.00%.  Physical exam:  General-alert well-developed and well-nourished white female reading a book in bed. Eyes-sclerae are nonicteric Neck-no lymphadenopathy thyroid appears normal. Heart-regular rate and rhythm with no gross murmurs or gallops the rhythm appears to be regular Lungs-clear Abdomen-soft nontender and nondistended  Assessment: 1. Anemia. At this point there is no clear indication that the patient is having a GI bleed. This she has had fairly extensive GI workup over the past several years. It would not be unreasonable to go ahead with a colonoscopy if she is going to be here for a while. Workup for other causes of her anemia would be reasonable as well. 2. Diarrhea/IBS symptoms. This is been worked up in the past with elevated urinary 5 HIAA but no primary carcinoid tumor discovered in fairly extensive workup. If anything his symptoms appear to be stable. 3. Cardiovascular disease. Status post CABG, mitral valve replacement, carotid endarterectomies. 4. Atrial fibrillation by history. Status post Maze  procedure at time of CABG. Has been on Coumadin since. 5. History of Colon Polyps. Colonoscopy was due last year but delayed due to cardiovascular problems 6. Recent Syncope. Cause unclear.  Plan: 1. Patient could have been bleeding at some point in the past causing her anemia. Her stools are now negative. She indicates to me that she may need placement in a nursing home. If it is decided to go ahead with this, we should hold her Coumadin and try to do EGD and colonoscopy this admission while in hospital. 2. Evaluate for other things that could be contributing to her anemia other than GI bleeding. We will follow her with you.   Carrol Hougland JR,Porsche Noguchi L 05/08/2011, 4:21 PM

## 2011-05-08 NOTE — H&P (Signed)
Internal Medicine Attending Admission Note Date: 05/08/2011  Patient name: [name] Medical record number: [id number] Date of birth: 1950-11-01 Age: 61 y.o. Gender: female  I saw and evaluated the patient. I reviewed the resident's note and I agree with the resident's findings and plan as documented in the resident's note.  Chief Complaint(s): generalized weakness and frequent falls  History - key components related to admission:  [name] is a 60 year old woman with a complicated past medical history who presents with a several month history of generalized fatigue and falls. 2 days prior to admission she was making her way from her bedroom through the living room into the kitchen and fell forward. Although she landed on her front she did not hit her face and had no injuries. She called for help and her neighbor apparently was able to assist her. There were no preceding symptoms of headache, chest pain, shortness of breath, palpitations, or visual changes. She did state that her legs became unsteady and she fell. She denies any loss of consciousness or confusion. Apparently she has fallen on a near monthly basis recently. She's previously received rehabilitation but never felt it was adequate to improve her gait and balance. When seen in the emergency department she was found to be anemic as well. She is therefore admitted to the internal medicine teaching service for further evaluation and care. She is without any acute complaints at this time.  Physical Exam - key components related to admission:  Filed Vitals:   05/08/11 0956 05/08/11 0957 05/08/11 0958 05/08/11 1400  BP: 133/55 113/54 122/69 111/40  Pulse: 73 69 80 75  Temp: 97.5 F (36.4 C)   97.5 F (36.4 C)  TempSrc: Oral   Oral  Resp: 20   20  Height:      Weight:      SpO2:    100%   Gen.: Well-developed, well-nourished, woman sitting comfortably in bed in no acute distress. She is alert and oriented x 3 and conversing  freely. Lungs: Clear to auscultation bilaterally without wheezes, rhonchi, or rales. Heart: Distant heart sounds without appreciable murmurs, rubs, or gallops. Abdomen: Soft, non tender, active bowel sounds. Extremities: 2+ pitting edema to the lower shin bilaterally. Neuro: Strength and sensation grossly intact.  Lab results:  Basic Metabolic Panel:  [facility] 05/07/11 1518  NA 136  K 3.8  CL 97  CO2 27  GLUCOSE 213*  BUN 22  CREATININE 0.92  CALCIUM 9.6  MG --  PHOS --   Liver Function Tests:  Basename 05/07/11 1518  AST 15  ALT 20  ALKPHOS 66  BILITOT 0.2*  PROT 7.3  ALBUMIN 3.9   CBC:  Basename 05/08/11 0525 05/07/11 1518  WBC 8.4 6.8  NEUTROABS -- --  HGB 8.8* 7.1*  HCT 28.8* 24.5*  MCV 79.1 78.3  PLT 239 249   Cardiac Enzymes:  Basename 05/07/11 1715  CKTOTAL --  CKMB --  CKMBINDEX --  TROPONINI <0.30   CBG:  Basename 05/08/11 0827 05/07/11 2122 05/07/11 1523  GLUCAP 202* 182* 280*   Coagulation:  Basename 05/08/11 0525 05/07/11 1715  INR 2.62* 2.56*   Imaging results:  Dg Chest 2 View  05/07/2011  *RADIOLOGY REPORT*  Clinical Data: Shortness of breath.  CHEST - 2 VIEW  Comparison: 11/13/2010.  Findings: The cardiac silhouette, mediastinal and hilar contours are within normal limits and stable.  Stable mild elevation of the left hemidiaphragm with overlying atelectasis. No pneumothorax.  IMPRESSION: Persistent elevation of the  left hemidiaphragm with overlying atelectasis.  Original Report Authenticated By: P. Kalman Jewels, M.D.   Other results:  EKG: Normal sinus rhythm at 75 beats per minute, normal axis, normal intervals, no significant Q waves, no LVH, good R wave progression, no ST-T changes.  Assessment & Plan by Problem:  [name] is a 61 year old woman with multiple medical problems who presents with recurrent falls and anemia. The cause of her recurrent falls is unclear. She is on multiple medications, which after review cannot  be trimmed. She will be assessed for orthostasis. Recent eye exam notes she has cataracts although they're felt not to be visually significant and will not be so for the near future. Her sensation subjectively is intact. She had multiple procedures last year which she has yet to fully recover and probably could benefit from further strengthening of the lower extremities, as well as gait training.  The cause of the anemia also remains unclear but is likely related to anemia of chronic disease and iron deficiency. She states she may have had a colonoscopy about 5 years ago although we have no record of the procedure at this time. Approximately 8-9 years ago she had a colonoscopy which demonstrated both adenomatous and hyperplastic polyps. A recent EGD was apparently unremarkable. She denies any gross hematuria making that an unlikely source of blood loss. Thus, the most likely source of her blood loss is the GI tract, specifically the colon. This, in combination with anemia of chronic disease likely explains her hematocrit.  1) Frequent falls: We will assess for orthostasis and treat appropriately. We will ask physical therapy to evaluate the patient and give [place] an opinion as to her candidacy for more intensive rehabilitation in a skilled nursing facility. With this  gait and balance training it is hoped her falls will decrease if not cease altogether.  2) Anemia: Unfortunately the patient received 2 units of packed red blood cells before and anemia workup could be instituted. We will therefore look for potential sources as noted above most likely being the GI tract. Therefore, Gastroenterology will be consulted to assess her candidacy for a colonoscopy.

## 2011-05-08 NOTE — Progress Notes (Addendum)
ANTICOAGULATION CONSULT NOTE - Initial Consult  Pharmacy Consult for Warfarin Indication: Hx Afib & porcine MVR  Allergies  Allergen Reactions  . Ativan     Patient's sister noted that ativan caused the patient to become extremely confused during hospitalization 09/2010  . Morphine     REACTION: Unknown reaction  . Percocet (Oxycodone-Acetaminophen)     headache  . Tramadol Hcl     REACTION: swelling   Vital Signs: Temp: 97.8 F (36.6 C) (03/28 0457) Temp src: Oral (03/28 0457) BP: 113/67 mmHg (03/28 0457) Pulse Rate: 84  (03/28 0457)  Labs:  Basename 05/08/11 0525 05/07/11 1715 05/07/11 1518  HGB 8.8* -- 7.1*  HCT 28.8* -- 24.5*  PLT 239 -- 249  APTT -- -- --  LABPROT 28.4* 27.9* --  INR 2.62* 2.56* --  HEPARINUNFRC -- -- --  CREATININE -- -- 0.92  CKTOTAL -- -- --  CKMB -- -- --  TROPONINI -- <0.30 --   Estimated Creatinine Clearance: 65.8 ml/min (by C-G formula based on Cr of 0.92).  Medical History: Past Medical History  Diagnosis Date  . Depression   . GERD (gastroesophageal reflux disease)     Followed by [name] // small bowel capsule endoscopy (11/2009) - minimal chronic inflammation, otherwise negative for  H.pylori, metaplasia, dysplasia  . Hypertension   . Hyperlipidemia   . Diabetes mellitus type II, uncontrolled     on insulin therapy  . COPD (chronic obstructive pulmonary disease) 2008    started home O2 (12/2010) // Prior significant smoker  // her CT of the chest there was no evidence of primary carcinoid tumor , scattered normal-sized mediastinal and hilar as well as axillary lymph nodes noted., no significant lymphadenopathy. COPD and emphysema with no acute cardiopulmonary disease noted , mild cardiomegaly and solitary calcified  plaque  . Moderate to severe pulmonary hypertension     severe per TEE (08/2010) - Peak RV-RA gradient 25m Hg  . Mitral regurgitation     mild to moderate per TEE (08/2010)  . Shingles   . Anemia     Anemia panel  (08/2009) - iron 21, TIBC 441, UIBC 420,  ferritin 11 // Pt has been unable to tolerate iron supplementation due to chronic abdominal discomfort, source of which still is not clear.  .Marland KitchenHistory of colonic polyps     Colonoscopy (10/2005) - colon polyp with path nonadenomatous, no malignancy - by Dr. [name] // Colonoscopy (02/2002) - 2 polyps noted, 1 - adenomatous, 2 - hyperplastic // Followed by Dr. [name] . Anxiety   . IBS (irritable bowel syndrome)   . Osteoporosis     DEXA (12/2004) - L-spine T score -2.6, Left hip -0.1  . Fibromyalgia   . Obstructive sleep apnea     on CPAP. // Sleep study (06/2009) - Moderate sleep apnea/ hypopnea syndrome , AHI 17.8 per hour with nonpositional hypopneas. CPAP titration to 12 CWP, AHI 2.4 per hour. Small resMed Quattro full-face mask with heated humidifier  . Restless leg syndrome   . Mitral stenosis     moderate per TEE (08/2010) // Mitral valve replacement with a 27-mm pericardial porcine valve (Medtronic Mosaic valve, serial ##[id number] on 09/20/2010 - PIvin Poot// [facility] [name] . Bilateral carotid artery stenosis     s/p right endarterectomy (06/2010). // Carotid UKorea(07/2010) -  60%-79% ICA stenosis, mid range of scale by velocity.  Left: Moderate-to-severe calcific and non-calcific plaque origin andproximal ICA and  ECA.  60%-79% ICA stenosis, highest end of scale // Followed by [name].  . Diabetes mellitus   . Peripheral vascular disease   . Coronary artery disease    Assessment: 61 y.o. F on chronic coumadin for Afib and porcine MVR with a therapeutic INR this a.m. (INR 2.62 <<2.56, goal of 2-3). Hgb/Hct up today after receiving PRBCs on admit. FOB negative. No s/sx of bleeding noted. PTA dose was noted to be 2 mg daily. The patient was re-educated on warfarin today.  Goal of Therapy:  INR 2-3   Plan:  1. Warfarin 2 mg x 1 dose at 1800 today 2. Will continue to monitor for any signs/symptoms of  bleeding and will follow up with PT/INR in the a.m.   Alycia Rossetti, PharmD, BCPS Clinical Pharmacist Pager: [phone] 05/08/2011 8:54 AM     Addendum: Dr. Nicoletta Dress called pharmacy and wanted to hold warfarin for colonoscopy/EGD procedure. Will f/u restart plans post procedure.  Alycia Rossetti, PharmD, BCPS 05/08/2011 10:56 AM

## 2011-05-09 ENCOUNTER — Ambulatory Visit (HOSPITAL_COMMUNITY): Payer: Medicare Other

## 2011-05-09 LAB — CBC
HCT: 30.4 % — ABNORMAL LOW (ref 36.0–46.0)
Hemoglobin: 9.3 g/dL — ABNORMAL LOW (ref 12.0–15.0)
MCH: 24.5 pg — ABNORMAL LOW (ref 26.0–34.0)
MCHC: 30.6 g/dL (ref 30.0–36.0)
MCV: 80.2 fL (ref 78.0–100.0)
Platelets: 220 10*3/uL (ref 150–400)
RBC: 3.79 MIL/uL — ABNORMAL LOW (ref 3.87–5.11)
RDW: 17.6 % — ABNORMAL HIGH (ref 11.5–15.5)
WBC: 6.6 10*3/uL (ref 4.0–10.5)

## 2011-05-09 LAB — GLUCOSE, CAPILLARY
Glucose-Capillary: 168 mg/dL — ABNORMAL HIGH (ref 70–99)
Glucose-Capillary: 300 mg/dL — ABNORMAL HIGH (ref 70–99)
Glucose-Capillary: 305 mg/dL — ABNORMAL HIGH (ref 70–99)
Glucose-Capillary: 293 mg/dL — ABNORMAL HIGH (ref 70–99)
Glucose-Capillary: 215 mg/dL — ABNORMAL HIGH (ref 70–99)

## 2011-05-09 LAB — BASIC METABOLIC PANEL
BUN: 19 mg/dL (ref 6–23)
CO2: 32 mEq/L (ref 19–32)
Calcium: 9.5 mg/dL (ref 8.4–10.5)
Chloride: 98 mEq/L (ref 96–112)
Creatinine, Ser: 1 mg/dL (ref 0.50–1.10)
GFR calc Af Amer: 68 mL/min — ABNORMAL LOW (ref >90)
GFR calc non Af Amer: 59 mL/min — ABNORMAL LOW (ref >90)
Glucose, Bld: 177 mg/dL — ABNORMAL HIGH (ref 70–99)
Potassium: 4 mEq/L (ref 3.5–5.1)
Sodium: 139 mEq/L (ref 135–145)

## 2011-05-09 LAB — PROTIME-INR
INR: 2.19 — ABNORMAL HIGH (ref 0.00–1.49)
Prothrombin Time: 24.7 seconds — ABNORMAL HIGH (ref 11.6–15.2)

## 2011-05-09 LAB — MAGNESIUM: Magnesium: 2.1 mg/dL (ref 1.5–2.5)

## 2011-05-09 MED ORDER — ZOLPIDEM TARTRATE 5 MG PO TABS
5.0000 mg | ORAL_TABLET | Freq: Every evening | ORAL | Status: DC | PRN
  Administered 2011-05-10 – 2011-05-11 (×2): 5 mg via ORAL
  Filled 2011-05-09 (×2): qty 1

## 2011-05-09 MED ORDER — POLYETHYLENE GLYCOL 3350 17 G PO PACK
17.0000 g | PACK | Freq: Two times a day (BID) | ORAL | Status: DC
  Administered 2011-05-09 – 2011-05-13 (×7): 17 g via ORAL
  Filled 2011-05-09 (×10): qty 1

## 2011-05-09 NOTE — Progress Notes (Signed)
[facility] PROGRESS NOTE Subjective Patient without gross bleeding. The decision has apparently been made to have her go to a nursing home next week for rehabilitation. It is requested that we perform endoscopic evaluation prior to her transfer to the nursing home. The patient is agreeable to this and desires to have it done. Due to her history of colon polyps, she is overdue for repeat colonoscopy.  Objective: Vital signs in last 24 hours: Temp:  [97.5 F (36.4 C)-98 F (36.7 C)] 97.9 F (36.6 C) (03/29 0457) Pulse Rate:  [68-76] 72  (03/29 0457) Resp:  [20] 20  (03/29 0457) BP: (110-121)/(40-73) 110/69 mmHg (03/29 0457) SpO2:  [97 %-100 %] 98 % (03/29 0719) Weight:  [84.7 kg (186 lb 11.7 oz)] 84.7 kg (186 lb 11.7 oz) (03/28 2115) Last BM Date: 05/07/11  Intake/Output from previous day: 03/28 0701 - 03/29 0700 In: 1400 [P.O.:1400] Out: 2700 [Urine:2700] Intake/Output this shift: Total I/O In: 240 [P.O.:240] Out: 100 [Urine:100]   Lab Results:  Basename 05/09/11 0520 05/08/11 0525 05/07/11 1518  WBC 6.6 8.4 6.8  HGB 9.3* 8.8* 7.1*  HCT 30.4* 28.8* 24.5*  PLT 220 239 249   BMET  Basename 05/09/11 0520 05/07/11 1518  NA 139 136  K 4.0 3.8  CL 98 97  CO2 32 27  CREATININE 1.00 0.92   LFT  Basename 05/07/11 1518  PROT 7.3  AST 15  ALT 20  ALKPHOS 66  BILITOT 0.2*  BILIDIR --  IBILI --   PT/INR  Basename 05/09/11 0520 05/08/11 0525 05/07/11 1715  LABPROT 24.7* 28.4* 27.9*  INR 2.19* 2.62* 2.56*   PANCREAS No results found for this basename: LIPASE:3 in the last 72 hours       Studies/Results: Dg Chest 2 View  05/07/2011  *RADIOLOGY REPORT*  Clinical Data: Shortness of breath.  CHEST - 2 VIEW  Comparison: 11/13/2010.  Findings: The cardiac silhouette, mediastinal and hilar contours are within normal limits and stable.  Stable mild elevation of the left hemidiaphragm with overlying atelectasis. No pneumothorax.  IMPRESSION: Persistent  elevation of the left hemidiaphragm with overlying atelectasis.  Original Report Authenticated By: P. Kalman Jewels, M.D.    Medications: I have reviewed the patient's current medications.  Assessment/Plan: 1. Anemia/history of colon polyps. Patient is overdue for colonoscopy. I think it's reasonable given her anemia of unclear calls to go ahead and do EGD and colonoscopy prior to her transfer to nursing home next week. Her Coumadin is on hold and her INR has drifted down to 2.2. We will plan on starting the prep on Sunday morning will go ahead and start her on MiraLAX now.   [name],[name] 05/09/2011, 10:07 AM

## 2011-05-09 NOTE — Progress Notes (Addendum)
Clinical Social Work Department CLINICAL SOCIAL WORK PLACEMENT NOTE 05/09/2011  Patient:  FRANCELLA, BARNETT  Account Number:  [id number] Admit date:  05/07/2011  Clinical Social Worker:  Barbette Or, Latanya Presser  Date/time:  05/09/2011 11:00 AM  Clinical Social Work is seeking post-discharge placement for this patient at the following level of care:   [place]   (*CSW will update this form in Epic as items are completed)   05/09/2011  Patient/family provided with [facility] Department of Clinical Social Work's list of facilities offering this level of care within the geographic area requested by the patient (or if unable, by the patient's family).  05/09/2011  Patient/family informed of their freedom to choose among providers that offer the needed level of care, that participate in Medicare, Medicaid or managed care program needed by the patient, have an available bed and are willing to accept the patient.  05/09/2011  Patient/family informed of [facility]' ownership interest in [facility], as well as of the fact that they are under no obligation to receive care at this facility.  PASARR submitted to EDS on 05/09/2011 PASARR number received from EDS on   FL2 transmitted to all facilities in geographic area requested by pt/family on  05/09/2011 FL2 transmitted to all facilities within larger geographic area on   Patient informed that his/her managed care company has contracts with or will negotiate with  certain facilities, including the following:   patient with Medicare     Patient/family informed of bed offers received: 05/10/2011 Patient chooses bed at [facility]-05/12/2011 Physician recommends and patient chooses bed at    Patient to be transferred to [facility] on 05/13/2011   Patient to be transferred to facility by   The following physician request were entered in Epic:   Additional Comments: patient requests no fax out to [facility], MSW Intern  Barbette Or, [place]  Addendum signed by  Gerre Scull, [phone]

## 2011-05-09 NOTE — Evaluation (Signed)
Physical Therapy Evaluation Patient Details Name: [name] MRN: [id number] DOB: Jul 24, 1950 Today's Date: 05/09/2011  Problem List:  Patient Active Problem List  Diagnoses  . DYSLIPIDEMIA  . DEPRESSION  . HYPERTENSION  . Claudication  . Anxiety  . Cerebrovascular disease  . GERD (gastroesophageal reflux disease)  . Diabetes mellitus type II  . COPD (chronic obstructive pulmonary disease)  . Moderate to severe pulmonary hypertension  . Mitral regurgitation  . Anemia  . Osteoporosis  . Obstructive sleep apnea  . Mitral stenosis  . Bilateral carotid artery stenosis  . Fibromyalgia  . Peripheral neuropathy  . Congestive heart failure  . A-fib  . Pleural effusion on left  . Fungal infection of gluteal cleft  . Preventative health care  . Dyspnea on exertion  . Xerostomia  . Breast pain in female  . Chronic constipation  . Occlusion and stenosis of carotid artery without mention of cerebral infarction  . Toe pain, right  . Falls frequently    Past Medical History:  Past Medical History  Diagnosis Date  . Depression   . GERD (gastroesophageal reflux disease)     Followed by [name] // small bowel capsule endoscopy (11/2009) - minimal chronic inflammation, otherwise negative for  H.pylori, metaplasia, dysplasia  . Hypertension   . Hyperlipidemia   . Diabetes mellitus type II, uncontrolled     on insulin therapy  . COPD (chronic obstructive pulmonary disease) 2008    started home O2 (12/2010) // Prior significant smoker  // her CT of the chest there was no evidence of primary carcinoid tumor , scattered normal-sized mediastinal and hilar as well as axillary lymph nodes noted., no significant lymphadenopathy. COPD and emphysema with no acute cardiopulmonary disease noted , mild cardiomegaly and solitary calcified  plaque  . Moderate to severe pulmonary hypertension     severe per TEE (08/2010) - Peak RV-RA gradient 14m Hg  . Mitral regurgitation     mild to  moderate per TEE (08/2010)  . Shingles   . Anemia     Anemia panel (08/2009) - iron 21, TIBC 441, UIBC 420,  ferritin 11 // Pt has been unable to tolerate iron supplementation due to chronic abdominal discomfort, source of which still is not clear.  .Marland KitchenHistory of colonic polyps     Colonoscopy (10/2005) - colon polyp with path nonadenomatous, no malignancy - by Dr. [name] // Colonoscopy (02/2002) - 2 polyps noted, 1 - adenomatous, 2 - hyperplastic // Followed by Dr. [name] . Anxiety   . IBS (irritable bowel syndrome)   . Osteoporosis     DEXA (12/2004) - L-spine T score -2.6, Left hip -0.1  . Fibromyalgia   . Obstructive sleep apnea     on CPAP. // Sleep study (06/2009) - Moderate sleep apnea/ hypopnea syndrome , AHI 17.8 per hour with nonpositional hypopneas. CPAP titration to 12 CWP, AHI 2.4 per hour. Small resMed Quattro full-face mask with heated humidifier  . Restless leg syndrome   . Mitral stenosis     moderate per TEE (08/2010) // Mitral valve replacement with a 27-mm pericardial porcine valve (Medtronic Mosaic valve, serial ##[id number] on 09/20/2010 - PIvin Poot// [name] . Bilateral carotid artery stenosis     s/p right endarterectomy (06/2010). // Carotid UKorea(07/2010) -  60%-79% ICA stenosis, mid range of scale by velocity.  Left: Moderate-to-severe calcific and non-calcific plaque origin andproximal ICA and ECA.  60%-79% ICA stenosis, highest end  of scale // Followed by [name].  . Diabetes mellitus   . Peripheral vascular disease   . Coronary artery disease    Past Surgical History:  Past Surgical History  Procedure Date  . Orif clavicle fracture 01/2004    by Thana Farr. [name], M.D for Right clavicle nonunion.  . Tonsillectomy   . Carotid endarterectomy 07/04/2010    right, by [name] for asymptomatic right carotid artery stenosis  . Lipoma exision 08/2005    occipital lipoma 1.5cm - by Dr. [name]  . Hysteroscopy with  endometrial ablation 06/2001    for persistent post-menopausal bleeding // by S. [name], M.D.  . Mitral valve replacement 09/20/10     with a 27-mm pericardial porcine valve (Medtronic Mosaic valve, serial #[id number]). 09/20/10, Dr Prescott Gum  . Left atrial maze procedure 09/20/10    for paroxysmal atrial fibrillation (Dr. Prescott Gum)  . Placement of left chest tube. 09/24/2010    Dr Prescott Gum  . Pr vein bypass graft,aorto-fem-pop May 2012  . Cardiac valve replacement Aug. 2012    PT Assessment/Plan/Recommendation PT Assessment Clinical Impression Statement: Pt is a 61 y/o female admitted for Hypertension. Prior to admission pt fell in bathroom at home.  Pt unsure of cause of fall.  Pt presents with significant falls risk and would benefit from SNF placement.  PT Recommendation/Assessment: Patient will need skilled PT in the acute care venue PT Problem List: Decreased strength;Decreased activity tolerance;Decreased balance;Pain Barriers to Discharge: Decreased caregiver support Barriers to Discharge Comments: Pt lives alone with limited community/familial support.  PT Therapy Diagnosis : Difficulty walking;Generalized weakness PT Plan PT Frequency: Min 3X/week PT Treatment/Interventions: Gait training;DME instruction;Functional mobility training;Therapeutic activities;Therapeutic exercise;Patient/family education;Neuromuscular re-education PT Recommendation Follow Up Recommendations: Skilled nursing facility;Supervision/Assistance - 24 hour Equipment Recommended: Defer to next venue PT Goals  Acute Rehab PT Goals PT Goal Formulation: With patient Time For Goal Achievement: 2 weeks Pt will Ambulate: >150 feet;with modified independence;with least restrictive assistive device PT Goal: Ambulate - Progress: Goal set today Pt will Perform Home Exercise Program: Independently PT Goal: Perform Home Exercise Program - Progress: Goal set today  PT Evaluation Precautions/Restrictions    Precautions Precautions: Fall Precaution Comments: Pt fell at home prior to admission Restrictions Weight Bearing Restrictions: No Prior Functioning  Home Living Lives With: Alone Receives Help From: Other (Comment) (No one) Type of Home: House Home Layout: One level Home Access: Level entry Bathroom Shower/Tub: Tub/shower unit;Curtain Bathroom Toilet: Handicapped height Bathroom Accessibility: Yes How Accessible: Accessible via walker [place]: Shower chair with back;Hand-held shower hose;Grab bars in shower;Grab bars around toilet;Walker - rolling;Straight cane Prior Function Level of Independence: Independent with basic ADLs;Independent with gait;Independent with transfers;Independent with homemaking with ambulation Able to Take Stairs?: Yes Driving: Yes Vocation: Retired Leisure: Hobbies-no Cognition Cognition Arousal/Alertness: Awake/alert Overall Cognitive Status: Appears within functional limits for tasks assessed Sensation/Coordination Sensation Light Touch: Appears Intact Stereognosis: Not tested Proprioception: Appears Intact Coordination Gross Motor Movements are Fluid and Coordinated: Yes Extremity Assessment RUE Assessment RUE Assessment: Within Functional Limits LUE Assessment LUE Assessment: Within Functional Limits RLE Assessment RLE Assessment: Within Functional Limits LLE Assessment LLE Assessment: Within Functional Limits Mobility (including Balance) Bed Mobility Bed Mobility: Yes Supine to Sit: 7: Independent;HOB flat Sit to Supine: 7: Independent;HOB flat Transfers Transfers: Yes Sit to Stand: 7: Independent;From bed;With upper extremity assist;From toilet Stand to Sit: 7: Independent Ambulation/Gait Ambulation/Gait: Yes Ambulation/Gait Assistance: 5: Supervision (for safety) Ambulation/Gait Assistance Details (indicate cue type and reason): Pt  c/o feeling SOB when ambulating.  Pt's O2 sat dropped to low 70s during  ambulation.   Ambulation Distance (Feet): 30 Feet Assistive device: Rolling walker Gait Pattern: Within Functional Limits Stairs: No Wheelchair Mobility Wheelchair Mobility: No  Posture/Postural Control Posture/Postural Control: No significant limitations Balance Balance Assessed: Yes Static Sitting Balance Static Sitting - Balance Support: Feet supported;No upper extremity supported Static Sitting - Level of Assistance: 7: Independent Static Sitting - Comment/# of Minutes: several minute on EOB with no LOB.  Static Standing Balance Static Standing - Balance Support: No upper extremity supported Static Standing - Level of Assistance: 5: Stand by assistance Static Standing - Comment/# of Minutes: 30+ seconds on EOB with no LOB  Exercise    End of Session PT - End of Session Equipment Utilized During Treatment: Gait belt Activity Tolerance: Patient limited by fatigue;Treatment limited secondary to medical complications (Comment) (O2 sats dropped to low 70s during ambulation. ) Patient left: in chair;with call bell in reach Nurse Communication: Mobility status for transfers;Mobility status for ambulation General Behavior During Session: [name] for tasks performed Cognition: [facility] for tasks performed  Terryann Verbeek 05/09/2011, 12:33 PM Ardice Boyan L. Mercy Malena DPT [phone]

## 2011-05-09 NOTE — Progress Notes (Signed)
Subjective: Patients states that she feels ok. No c/o. No SOB or PND. No dizziness or lightheadedness.   No acute events overnight. Objective: Vital signs in last 24 hours: Filed Vitals:   05/08/11 2115 05/08/11 2300 05/09/11 0457 05/09/11 0719  BP: 121/73  110/69   Pulse: 73 68 72   Temp: 97.5 F (36.4 C)  97.9 F (36.6 C)   TempSrc: Oral  Oral   Resp: _0 Height:      Weight: 186 lb 11.7 oz (84.7 kg)     SpO2: 99% 100% 97% 98%   Weight change: -9 lb 14.7 oz (-4.5 kg)  Intake/Output Summary (Last 24 hours) at 05/09/11 0732 Last data filed at 05/09/11 0640  Gross per 24 hour  Intake   1400 ml  Output   2700 ml  Net  -1300 ml   Physical Exam: General: NAD, calm and pleasant  Neck: Supple,No JVD. Carotid Bruit present B/L  Cardiovascular: RRR, S1 normal, S2 normal, systolic murmur.  Pulmonary/Chest: CTAB, no wheezes, rales, or rhonchi  Abdominal: Soft. Non-tender, non-distended, bowel sounds are normal, no masses, organomegaly, or guarding present.  Extremeties: 1-2+ pitting edema B/L Musculoskeletal: No joint deformities, erythema, or stiffness, ROM full and no nontender Neurological: A&O x3, Strenght is normal and symmetric bilaterally, cranial nerve II-XII are grossly intact, no focal motor deficit, sensory intact to light touch bilaterally.  Skin: Warm, dry and intact. No rash, cyanosis, or clubbing.     Lab Results: Basic Metabolic Panel:  Lab 98/92/11 0520 05/07/11 1518  NA 139 136  K 4.0 3.8  CL 98 97  CO2 32 27  GLUCOSE 177* 213*  BUN 19 22  CREATININE 1.00 0.92  CALCIUM 9.5 9.6  MG -- --  PHOS -- --   Liver Function Tests:  Lab 05/07/11 1518  AST 15  ALT 20  ALKPHOS 66  BILITOT 0.2*  PROT 7.3  ALBUMIN 3.9   CBC:  Lab 05/09/11 0520 05/08/11 0525  WBC 6.6 8.4  NEUTROABS -- --  HGB 9.3* 8.8*  HCT 30.4* 28.8*  MCV 80.2 79.1  PLT 220 239   Cardiac Enzymes:  Lab 05/07/11 1715  CKTOTAL --  CKMB --  CKMBINDEX --  TROPONINI <0.30     CBG:  Lab 05/08/11 2158 05/08/11 1638 05/08/11 1330 05/08/11 0827 05/07/11 2122 05/07/11 1523  GLUCAP 182* 252* 304* 202* 182* 280*   Coagulation:  Lab 05/09/11 0520 05/08/11 0525 05/07/11 1715  LABPROT 24.7* 28.4* 27.9*  INR 2.19* 2.62* 2.56*   Urine Drug Screen: Drugs of Abuse     Component Value Date/Time   LABOPIA POSITIVE* 03/27/2009 0133   COCAINSCRNUR NONE DETECTED 03/27/2009 0133   LABBENZ POSITIVE* 03/27/2009 0133   AMPHETMU NONE DETECTED 03/27/2009 0133   THCU NONE DETECTED 03/27/2009 0133   LABBARB  Value: NONE DETECTED        DRUG SCREEN FOR MEDICAL PURPOSES ONLY.  IF CONFIRMATION IS NEEDED FOR ANY PURPOSE, NOTIFY LAB WITHIN 5 DAYS.        LOWEST DETECTABLE LIMITS FOR URINE DRUG SCREEN Drug Class       Cutoff (ng/mL) Amphetamine      1000 Barbiturate      200 Benzodiazepine   941 Tricyclics       740 Opiates          300 Cocaine          300 THC  50 03/27/2009 0133     Studies/Results: Dg Chest 2 View  05/07/2011  *RADIOLOGY REPORT*  Clinical Data: Shortness of breath.  CHEST - 2 VIEW  Comparison: 11/13/2010.  Findings: The cardiac silhouette, mediastinal and hilar contours are within normal limits and stable.  Stable mild elevation of the left hemidiaphragm with overlying atelectasis. No pneumothorax.  IMPRESSION: Persistent elevation of the left hemidiaphragm with overlying atelectasis.  Original Report Authenticated By: P. Kalman Jewels, M.D.   Medications: I have reviewed the patient's current medications. Scheduled Meds:   . amiodarone  200 mg Oral Daily  . aspirin EC  81 mg Oral Daily  . atorvastatin  40 mg Oral q1800  . buPROPion  150 mg Oral Daily  . docusate sodium  100 mg Oral BID  . DULoxetine  60 mg Oral Once  . DULoxetine  90 mg Oral Daily  . Fluticasone-Salmeterol  1 puff Inhalation BID  . furosemide  80 mg Oral BID  . gabapentin  300 mg Oral BID  . insulin aspart  0-15 Units Subcutaneous TID WC  . insulin aspart  0-5 Units Subcutaneous  QHS  . insulin detemir  45 Units Subcutaneous QHS  . loratadine  10 mg Oral Daily  . pantoprazole  80 mg Oral BID AC  . potassium chloride SA  20 mEq Oral TID  . sodium chloride  3 mL Intravenous Q12H  . tiotropium  18 mcg Inhalation Daily  . DISCONTD: buPROPion  150 mg Oral Daily  . DISCONTD: DULoxetine  30 mg Oral Daily  . DISCONTD: warfarin  2 mg Oral ONCE-1800  . DISCONTD: Warfarin - Pharmacist Dosing Inpatient   Does not apply q1800   Continuous Infusions:  PRN Meds:.acetaminophen, acetaminophen, albuterol, ALPRAZolam, HYDROcodone-acetaminophen Assessment/Plan:  #Disposition: Short term SNF placement>>>SW to follow>>>possible early next week after EGD/Colonoscopy  1) s/p Fall 2/2 Syncope:     Neurological or cardiac causes unlikely. Negative Orthostatic hypotension.     Etiology is likely multifactorial including her moderate to severe anemia vs chronic unsteady gait vs narcotic effect.    Patient already feels better after 2 units PRBC transfusion.  Plan:  - Tele  - Falls precaution and PT therapy  - correction of possible etiologies                  For anemia>>>Hb 8.8 after 2 units PRBCs, FOBT negative, and GI consulted for ?EGD/ Colonoscopy                  For chronic unsteady gait>>>no focal neuro deficit>>PT therapy                  For ? Narcotics>>>on Vicotin for her FM ( allergic to percocet and tramadol).                            No NSAIDS >>>chronic coumadin therapy and ? GIB - Short term SNF placement  2) Anemia: Iron Deficiency Anemia - Ferritin 11 in 7/11.  - unclear source of her anemia other than iron deficiency. Patient states that she has had 3-4 times blood transfusions in the past and she is till anemic  -FOBT negative on admission  -Hb better after 2 units PRBCs  - [facility] for EGD/colonoscopy on Monday - Coumadin on hold nowI. (Coumadin is for her P. A fib and she is NSR now).   3) Paroxysmal A- Fib: Currently in NSR.  S/p LA Maze procedure  09/2010.  - continue Tele  - Continue Amiodarone 200 mg daily  - hold coumadin for now for EGD/ Colonoscopy   4) Carotid Artery Disease: S/p R endarterectomy 07/2010.  Carotid Doppler- 12/12- R Carotid 40-59% stenoses and L 60-79%.  - Followed by [facility]- next Appt in July.   5) DM 2: Fairly controlled. Last A1c 8.5 in 2/13  Continue home Levemir and start SSI moderate.   6) COPD: Continue O2 and home inhalers.   7) CHF- diastolic: Grade 2.  - Continue Lasix.   8) HTN: Cont home meds.   9) HLD  statin   LOS: 2 days   LI, NA 05/09/2011, 7:32 AM

## 2011-05-09 NOTE — Progress Notes (Signed)
Clinical Social Work Department BRIEF PSYCHOSOCIAL ASSESSMENT 05/09/2011  Patient:  [name], [name]     Account Number:  [id number]     Admit date:  05/07/2011  Clinical Social Worker:  Terrall Laity  Date/Time:  05/09/2011 10:41 AM  Referred by:  Physician  Date Referred:  05/08/2011 Referred for  SNF Placement   Other Referral:   Interview type:  Patient Other interview type:    PSYCHOSOCIAL DATA Living Status:  ALONE Admitted from facility:   Level of care:   Primary support name:  Arvin Collard [phone] Primary support relationship to patient:  SIBLING Degree of support available:   strong    CURRENT CONCERNS Current Concerns  Post-Acute Placement   Other Concerns:    SOCIAL WORK ASSESSMENT / PLAN CSW spoke with patient to discuss plans and offer support. Discussed PT/MD recommendation for SNF placement for therapy after discharge.  Patient agreeable to SNF search in [facility]. Patient requested that CSW contact sister on Monday to communicate plans.  CSW will facilitate SNF search process and follow up with bed offers.  CSW will continue to facilitate patient discharge needs once medically ready for discharge.   Assessment/plan status:  Psychosocial Support/Ongoing Assessment of Needs Other assessment/ plan:   Information/referral to community resources:   none at this time    PATIENT'S/FAMILY'S RESPONSE TO PLAN OF CARE: Patient was alert and oriented x3.  Patient is agreeable to SNF placement and familiar with the process but does not want [facility].  Patient requested that CSW contact her sister on Monday because she works nights.    [name], MSW Intern  [place], [place]

## 2011-05-09 NOTE — Progress Notes (Signed)
Inpatient Diabetes Program Recommendations  AACE/ADA: New Consensus Statement on Inpatient Glycemic Control (2009)  Target Ranges:  Prepandial:   less than 140 mg/dL      Peak postprandial:   less than 180 mg/dL (1-2 hours)      Critically ill patients:  140 - 180 mg/dL    Results for PAMMIE, CHIRINO (MRN [id number]) as of 05/09/2011 12:50  Ref. Range 05/09/2011 08:13 05/09/2011 11:55  Glucose-Capillary Latest Range: 70-99 mg/dL 168 (H) 300 (H)   Per home medication list, patient takes Novolog 7 units tid with meals at home (for 3-4 carb choices at meals) + correction Novolog.  Eating 100% of meals here in hospital.  Inpatient Diabetes Program Recommendations Insulin - Meal Coverage: Please add meal coverage- Novolog 4 units tid with meals to start.  Note: Will follow. Wyn Quaker RN, MSN, CDE Diabetes Coordinator Inpatient Diabetes Program [phone]

## 2011-05-10 LAB — GLUCOSE, CAPILLARY
Glucose-Capillary: 210 mg/dL — ABNORMAL HIGH (ref 70–99)
Glucose-Capillary: 207 mg/dL — ABNORMAL HIGH (ref 70–99)
Glucose-Capillary: 262 mg/dL — ABNORMAL HIGH (ref 70–99)
Glucose-Capillary: 245 mg/dL — ABNORMAL HIGH (ref 70–99)

## 2011-05-10 LAB — BASIC METABOLIC PANEL
BUN: 22 mg/dL (ref 6–23)
CO2: 32 mEq/L (ref 19–32)
Calcium: 9.1 mg/dL (ref 8.4–10.5)
Chloride: 97 mEq/L (ref 96–112)
Creatinine, Ser: 1.04 mg/dL (ref 0.50–1.10)
GFR calc Af Amer: 65 mL/min — ABNORMAL LOW (ref >90)
GFR calc non Af Amer: 56 mL/min — ABNORMAL LOW (ref >90)
Glucose, Bld: 236 mg/dL — ABNORMAL HIGH (ref 70–99)
Potassium: 3.7 mEq/L (ref 3.5–5.1)
Sodium: 138 mEq/L (ref 135–145)

## 2011-05-10 LAB — PROTIME-INR
INR: 1.78 — ABNORMAL HIGH (ref 0.00–1.49)
Prothrombin Time: 21 seconds — ABNORMAL HIGH (ref 11.6–15.2)

## 2011-05-10 NOTE — Progress Notes (Signed)
Subjective: Patients states that she feels ok. No c/o. No SOB or PND. No dizziness or lightheadedness.   No acute events overnight. Pt feels at baseline. 1.76 -INR . Coumadin held for endoscopies on Monday .  Patient waiting for endoscopies and SNF placement.   Objective: Vital signs in last 24 hours: Filed Vitals:   05/10/11 0445 05/10/11 0811 05/10/11 0938 05/10/11 1306  BP: 137/67  139/59 150/66  Pulse: 76  80 84  Temp: 98.1 F (36.7 C)  97 F (36.1 C) 97.8 F (36.6 C)  TempSrc: Oral  Oral Axillary  Resp: _0 Height:      Weight:      SpO2: 93% 94% 99% 96%   Weight change: 3.5 oz (0.1 kg)  Intake/Output Summary (Last 24 hours) at 05/10/11 1342 Last data filed at 05/10/11 1307  Gross per 24 hour  Intake   1560 ml  Output   4250 ml  Net  -2690 ml   Physical Exam: General: NAD, calm and pleasant  Neck: Supple,No JVD. Carotid Bruit present B/L  Cardiovascular: RRR, S1 normal, S2 normal, systolic murmur.  Pulmonary/Chest: CTAB, no wheezes, rales, or rhonchi  Abdominal: Soft. Non-tender, non-distended, bowel sounds are normal, no masses, organomegaly, or guarding present.  Extremeties: 1-2+ pitting edema B/L Musculoskeletal: No joint deformities, erythema, or stiffness, ROM full and no nontender Neurological: A&O x3, Strenght is normal and symmetric bilaterally, cranial nerve II-XII are grossly intact, no focal motor deficit, sensory intact to light touch bilaterally.  Skin: Warm, dry and intact. No rash, cyanosis, or clubbing.     Lab Results: Basic Metabolic Panel:  Lab 89/79/15 0700 05/09/11 0520  NA 138 139  K 3.7 4.0  CL 97 98  CO2 32 32  GLUCOSE 236* 177*  BUN 22 19  CREATININE 1.04 1.00  CALCIUM 9.1 9.5  MG -- 2.1  PHOS -- --   Liver Function Tests:  Lab 05/07/11 1518  AST 15  ALT 20  ALKPHOS 66  BILITOT 0.2*  PROT 7.3  ALBUMIN 3.9   CBC:  Lab 05/09/11 0520 05/08/11 0525  WBC 6.6 8.4  NEUTROABS -- --  HGB 9.3* 8.8*  HCT 30.4*  28.8*  MCV 80.2 79.1  PLT 220 239   Cardiac Enzymes:  Lab 05/07/11 1715  CKTOTAL --  CKMB --  CKMBINDEX --  TROPONINI <0.30   CBG:  Lab 05/10/11 1140 05/10/11 0755 05/09/11 2046 05/09/11 1640 05/09/11 1428 05/09/11 1155  GLUCAP 207* 210* 215* 293* 305* 300*   Coagulation:  Lab 05/10/11 0700 05/09/11 0520 05/08/11 0525 05/07/11 1715  LABPROT 21.0* 24.7* 28.4* 27.9*  INR 1.78* 2.19* 2.62* 2.56*   Urine Drug Screen: Drugs of Abuse     Component Value Date/Time   LABOPIA POSITIVE* 03/27/2009 0133   COCAINSCRNUR NONE DETECTED 03/27/2009 0133   LABBENZ POSITIVE* 03/27/2009 0133   AMPHETMU NONE DETECTED 03/27/2009 0133   THCU NONE DETECTED 03/27/2009 0133   LABBARB  Value: NONE DETECTED        DRUG SCREEN FOR MEDICAL PURPOSES ONLY.  IF CONFIRMATION IS NEEDED FOR ANY PURPOSE, NOTIFY LAB WITHIN 5 DAYS.        LOWEST DETECTABLE LIMITS FOR URINE DRUG SCREEN Drug Class       Cutoff (ng/mL) Amphetamine      1000 Barbiturate      200 Benzodiazepine   041 Tricyclics       364 Opiates          300 Cocaine  300 THC              50 03/27/2009 0133     Studies/Results: No results found. Medications: I have reviewed the patient's current medications. Scheduled Meds:    . amiodarone  200 mg Oral Daily  . aspirin EC  81 mg Oral Daily  . atorvastatin  40 mg Oral q1800  . buPROPion  150 mg Oral Daily  . docusate sodium  100 mg Oral BID  . DULoxetine  90 mg Oral Daily  . Fluticasone-Salmeterol  1 puff Inhalation BID  . furosemide  80 mg Oral BID  . gabapentin  300 mg Oral BID  . insulin aspart  0-15 Units Subcutaneous TID WC  . insulin aspart  0-5 Units Subcutaneous QHS  . insulin detemir  45 Units Subcutaneous QHS  . loratadine  10 mg Oral Daily  . pantoprazole  80 mg Oral BID AC  . polyethylene glycol  17 g Oral BID  . potassium chloride SA  20 mEq Oral TID  . sodium chloride  3 mL Intravenous Q12H  . tiotropium  18 mcg Inhalation Daily   Continuous Infusions:  PRN  Meds:.acetaminophen, acetaminophen, albuterol, ALPRAZolam, HYDROcodone-acetaminophen, zolpidem Assessment/Plan:  #Disposition: Short term SNF placement>>>SW to follow>>>possible early next week after EGD/Colonoscopy on Monday  1) s/p Fall 2/2 Syncope:     Neurological or cardiac causes unlikely. Negative Orthostatic hypotension.     Etiology is likely multifactorial including her moderate to severe anemia vs chronic unsteady gait vs narcotic effect.    Patient already feels better after 2 units PRBC transfusion.  Plan:  - Tele  - Falls precaution and PT therapy  - correction of possible etiologies                  For anemia>>>Hb 8.8 after 2 units PRBCs, FOBT negative, and GI consulted for ?EGD/ Colonoscopy                  For chronic unsteady gait>>>no focal neuro deficit>>PT therapy                  For ? Narcotics>>>on Vicotin for her FM ( allergic to percocet and tramadol).                            No NSAIDS >>>chronic coumadin therapy and ? GIB - Short term SNF placement  2) Anemia: Iron Deficiency Anemia - Ferritin 11 in 7/11.  - unclear source of her anemia other than iron deficiency. Patient states that she has had 3-4 times blood transfusions in the past and she is till anemic  -FOBT negative on admission  -Hb better after 2 units PRBCs  - [facility] for EGD/colonoscopy on Monday - Coumadin on hold nowI. (Coumadin is for her P. A fib and she is NSR now).   3) Paroxysmal A- Fib: Currently in NSR.  S/p LA Maze procedure 09/2010.  - continue Tele  - Continue Amiodarone 200 mg daily  - hold coumadin for now for EGD/ Colonoscopy   4) Carotid Artery Disease: S/p R endarterectomy 07/2010.  Carotid Doppler- 12/12- R Carotid 40-59% stenoses and L 60-79%.  - Followed by [facility]- next Appt in July.   5) DM 2: Fairly controlled. Last A1c 8.5 in 2/13  Continue home Levemir and start SSI moderate.   6) COPD: Continue O2 and home inhalers.   7) CHF- diastolic: Grade 2.  - Continue  Lasix.   8) HTN: Cont home meds.   9) HLD  statin   LOS: 3 days   Larina Lieurance 05/10/2011, 1:42 PM

## 2011-05-10 NOTE — Progress Notes (Signed)
CSW met with pt and provided bed offers. Pt reports her sister will tour offers and f/u with weekday CSW re: choice. CSW will continue to follow for SNF. Wandra Feinstein, MSW, [place] (weekend)

## 2011-05-10 NOTE — Progress Notes (Signed)
[facility] PROGRESS NOTE Subjective No complaints no gross bleeding has started the Miralax  Objective: Vital signs in last 24 hours: Temp:  [97 F (36.1 C)-98.1 F (36.7 C)] 97 F (36.1 C) (03/30 0938) Pulse Rate:  [75-80] 80  (03/30 0938) Resp:  [18-20] 19  (03/30 0938) BP: (101-150)/(59-72) 139/59 mmHg (03/30 0938) SpO2:  [93 %-99 %] 99 % (03/30 0938) Weight:  [84.8 kg (186 lb 15.2 oz)] 84.8 kg (186 lb 15.2 oz) (03/29 2140) Last BM Date: 05/07/11  Intake/Output from previous day: 03/29 0701 - 03/30 0700 In: 1440 [P.O.:1440] Out: 3850 [Urine:3850] Intake/Output this shift: Total I/O In: 480 [P.O.:480] Out: 550 [Urine:550]    Lab Results:  [facility] 05/09/11 0520 05/08/11 0525 05/07/11 1518  WBC 6.6 8.4 6.8  HGB 9.3* 8.8* 7.1*  HCT 30.4* 28.8* 24.5*  PLT 220 239 249   BMET  Basename 05/10/11 0700 05/09/11 0520 05/07/11 1518  NA 138 139 136  K 3.7 4.0 3.8  CL 97 98 97  CO2 32 32 27  CREATININE 1.04 1.00 0.92   LFT  Basename 05/07/11 1518  PROT 7.3  AST 15  ALT 20  ALKPHOS 66  BILITOT 0.2*  BILIDIR --  IBILI --   PT/INR  Basename 05/10/11 0700 05/09/11 0520 05/08/11 0525  LABPROT 21.0* 24.7* 28.4*  INR 1.78* 2.19* 2.62*   PANCREAS No results found for this basename: LIPASE:3 in the last 72 hours       Studies/Results: No results found.  Medications: I have reviewed the patient's current medications.  Assessment/Plan: 1. Anemia.  Not clearly GI bleeding with 1 stool negative but has prior history of polyps. Scheduled for colonoscopy for Monday with Dr Michail Sermon and will plan on EGD at the same time. Has had some trouble swallowing. Will check for production problem.   Srinivas Lippman JR,Nour Scalise L 05/10/2011, 9:52 AM

## 2011-05-11 ENCOUNTER — Encounter (HOSPITAL_COMMUNITY): Payer: Self-pay | Admitting: Internal Medicine

## 2011-05-11 LAB — CBC
HCT: 29.8 % — ABNORMAL LOW (ref 36.0–46.0)
Hemoglobin: 9 g/dL — ABNORMAL LOW (ref 12.0–15.0)
MCH: 24.5 pg — ABNORMAL LOW (ref 26.0–34.0)
MCHC: 30.2 g/dL (ref 30.0–36.0)
MCV: 81 fL (ref 78.0–100.0)
Platelets: 211 10*3/uL (ref 150–400)
RBC: 3.68 MIL/uL — ABNORMAL LOW (ref 3.87–5.11)
RDW: 17.8 % — ABNORMAL HIGH (ref 11.5–15.5)
WBC: 8.6 10*3/uL (ref 4.0–10.5)

## 2011-05-11 LAB — IRON AND TIBC
Iron: 40 ug/dL — ABNORMAL LOW (ref 42–135)
Saturation Ratios: 9 % — ABNORMAL LOW (ref 20–55)
TIBC: 449 ug/dL (ref 250–470)
UIBC: 409 ug/dL — ABNORMAL HIGH (ref 125–400)

## 2011-05-11 LAB — TYPE AND SCREEN
ABO/RH(D): A NEG
Antibody Screen: NEGATIVE
Unit division: 0
Unit division: 0
Unit division: 0

## 2011-05-11 LAB — GLUCOSE, CAPILLARY
Glucose-Capillary: 188 mg/dL — ABNORMAL HIGH (ref 70–99)
Glucose-Capillary: 137 mg/dL — ABNORMAL HIGH (ref 70–99)
Glucose-Capillary: 206 mg/dL — ABNORMAL HIGH (ref 70–99)
Glucose-Capillary: 166 mg/dL — ABNORMAL HIGH (ref 70–99)
Glucose-Capillary: 180 mg/dL — ABNORMAL HIGH (ref 70–99)

## 2011-05-11 LAB — RETICULOCYTES
RBC.: 3.9 MIL/uL (ref 3.87–5.11)
Retic Count, Absolute: 140.4 10*3/uL (ref 19.0–186.0)
Retic Ct Pct: 3.6 % — ABNORMAL HIGH (ref 0.4–3.1)

## 2011-05-11 LAB — VITAMIN B12: Vitamin B-12: 488 pg/mL (ref 211–911)

## 2011-05-11 LAB — FERRITIN: Ferritin: 19 ng/mL (ref 10–291)

## 2011-05-11 LAB — PROTIME-INR
INR: 1.31 (ref 0.00–1.49)
Prothrombin Time: 16.5 seconds — ABNORMAL HIGH (ref 11.6–15.2)

## 2011-05-11 MED ORDER — PEG 3350-KCL-NA BICARB-NACL 420 G PO SOLR
4000.0000 mL | Freq: Once | ORAL | Status: AC
  Administered 2011-05-11: 4000 mL via ORAL
  Filled 2011-05-11: qty 4000

## 2011-05-11 NOTE — Progress Notes (Signed)
INTERNAL MEDICINE TEACHING SERVICE NIGHT FLOAT PROGRESS NOTE   Subjective:    We were called overnight by the RN for evaluation of fall at 3 am. At time of evaluation, pt stted she had taken an [place], but was then trying to stay awake and walking around the room. As she was trying to sit in her recliner, she caught her foot on the leg of the chair and "slid to the floor." She denies any LOC or trauma. She did not hit her head. There were no prodromal symptoms such as dizziness or palpitations. She states that she wanted to see how long it would take for someone to find her on the floor, so she did not call out for help or attempt to rise on her own.  Her brother apparently had a fall at [facility] hospital due to a stroke and was found after a delay to have a stroke. He was brought to [facility] and was found to be brain dead according to the patient.  She currently denies any pain, dizziness, weakness, numbness, palpitation, SOB above baseline or other complaint.  She also declines to wear her SCDs     Objective:    BP 117/71  Pulse 73  Temp(Src) 97.5 F (36.4 C) (Oral)  Resp 20  Ht _0  (1.575 m)  Wt 185 lb 10 oz (84.2 kg)  BMI 33.95 kg/m2  SpO2 96%    Physical Exam: General: Vital signs reviewed and noted. Well-developed, well-nourished, in no acute distress; alert, appropriate and cooperative throughout examination.  Lungs:  Normal respiratory effort. Clear to auscultation BL without crackles or wheezes.  Heart: RRR. S1 and S2 normal without gallop, murmur, or rubs.  Abdomen:  BS normoactive. Soft, Nondistended, non-tender.  No masses or organomegaly.  Extremities: 2+ pretibial edema. SCDs not in place  Neurologic exam: CNII-XII intact. No focal neurologic deficit. Strength and sensation intact. Slightly hyper reflexive biceps, brachioradialis and tibial reflexes but this is equal bilaterally. Patient ambulated with me with no difficulty.   Assessment/ Plan:    1) fall - Fall is  mechanical in nature with the aggravating factor of sedation from zolpidem while trying to stay awake. No injury is suspected at this time as pt denies pain, trauma, or focal neurologic complaint. Nothing to suggest any more concerning reasons for fall such as stroke or arrhythmia as VS are stable and exam is benign. Patient already placed on bed monitor by excellent nursing staff.  - primary team could consider switching furosemide to once daily (if not needed for blood pressure control) to avoid frequent night time trips to the bathroom   [facility], [place]  05/11/2011, 3:47 AM

## 2011-05-11 NOTE — Progress Notes (Signed)
Subjective: Patients states that she feels ok. No c/o. No SOB or PND. No dizziness or lightheadedness. Patient has had fall without any injury noted last night.   1.31 -INR . Coumadin held for endoscopies on Monday .  Patient waiting for endoscopies and SNF placement.  Objective: Vital signs in last 24 hours: Filed Vitals:   05/11/11 0532 05/11/11 0629 05/11/11 0727 05/11/11 0730  BP: 136/74 130/72 120/72   Pulse: 83 85 75   Temp: 97.7 F (36.5 C) 97.8 F (36.6 C) 97.7 F (36.5 C)   TempSrc: Oral Oral Oral   Resp: _0 Height:      Weight:      SpO2: 100% 95% 98% 98%   Weight change: -1 lb 5.2 oz (-0.6 kg)  Intake/Output Summary (Last 24 hours) at 05/11/11 0925 Last data filed at 05/11/11 0840  Gross per 24 hour  Intake   1080 ml  Output   5750 ml  Net  -4670 ml   Physical Exam: General: NAD, calm and pleasant  Neck: Supple,No JVD. Carotid Bruit present B/L  Cardiovascular: RRR, S1 normal, S2 normal, systolic murmur.  Pulmonary/Chest: CTAB, no wheezes, rales, or rhonchi  Abdominal: Soft. Non-tender, non-distended, bowel sounds are normal, no masses, organomegaly, or guarding present.  Extremeties: 1-2+ pitting edema B/L Musculoskeletal: No joint deformities, erythema, or stiffness, ROM full and no nontender Neurological: A&O x3, Strenght is normal and symmetric bilaterally, cranial nerve II-XII are grossly intact, no focal motor deficit, sensory intact to light touch bilaterally.  Skin: Warm, dry and intact. No rash, cyanosis, or clubbing  Lab Results: Basic Metabolic Panel:  Lab 05/39/76 0700 05/09/11 0520  NA 138 139  K 3.7 4.0  CL 97 98  CO2 32 32  GLUCOSE 236* 177*  BUN 22 19  CREATININE 1.04 1.00  CALCIUM 9.1 9.5  MG -- 2.1  PHOS -- --   Liver Function Tests:  Lab 05/07/11 1518  AST 15  ALT 20  ALKPHOS 66  BILITOT 0.2*  PROT 7.3  ALBUMIN 3.9   CBC:  Lab 05/09/11 0520 05/08/11 0525  WBC 6.6 8.4  NEUTROABS -- --  HGB 9.3* 8.8*  HCT  30.4* 28.8*  MCV 80.2 79.1  PLT 220 239   Cardiac Enzymes:  Lab 05/07/11 1715  CKTOTAL --  CKMB --  CKMBINDEX --  TROPONINI <0.30   CBG:  Lab 05/11/11 0749 05/11/11 0355 05/10/11 2126 05/10/11 1659 05/10/11 1140 05/10/11 0755  GLUCAP 137* 188* 245* 262* 207* 210*   Coagulation:  Lab 05/11/11 0707 05/10/11 0700 05/09/11 0520 05/08/11 0525  LABPROT 16.5* 21.0* 24.7* 28.4*  INR 1.31 1.78* 2.19* 2.62*   Anemia Panel:  Lab 05/11/11 0707  VITAMINB12 --  FOLATE --  FERRITIN --  TIBC --  IRON --  RETICCTPCT 3.6*   Urine Drug Screen: Drugs of Abuse     Component Value Date/Time   LABOPIA POSITIVE* 03/27/2009 0133   COCAINSCRNUR NONE DETECTED 03/27/2009 0133   LABBENZ POSITIVE* 03/27/2009 0133   AMPHETMU NONE DETECTED 03/27/2009 0133   THCU NONE DETECTED 03/27/2009 0133   LABBARB  Value: NONE DETECTED        DRUG SCREEN FOR MEDICAL PURPOSES ONLY.  IF CONFIRMATION IS NEEDED FOR ANY PURPOSE, NOTIFY LAB WITHIN 5 DAYS.        LOWEST DETECTABLE LIMITS FOR URINE DRUG SCREEN Drug Class       Cutoff (ng/mL) Amphetamine      1000 Barbiturate  200 Benzodiazepine   428 Tricyclics       768 Opiates          300 Cocaine          300 THC              50 03/27/2009 0133    Medications: I have reviewed the patient's current medications. Scheduled Meds:   . amiodarone  200 mg Oral Daily  . aspirin EC  81 mg Oral Daily  . atorvastatin  40 mg Oral q1800  . buPROPion  150 mg Oral Daily  . docusate sodium  100 mg Oral BID  . DULoxetine  90 mg Oral Daily  . Fluticasone-Salmeterol  1 puff Inhalation BID  . furosemide  80 mg Oral BID  . gabapentin  300 mg Oral BID  . insulin aspart  0-15 Units Subcutaneous TID WC  . insulin aspart  0-5 Units Subcutaneous QHS  . insulin detemir  45 Units Subcutaneous QHS  . loratadine  10 mg Oral Daily  . pantoprazole  80 mg Oral BID AC  . polyethylene glycol  17 g Oral BID  . potassium chloride SA  20 mEq Oral TID  . sodium chloride  3 mL Intravenous Q12H    . tiotropium  18 mcg Inhalation Daily   Continuous Infusions:  PRN Meds:.acetaminophen, acetaminophen, albuterol, ALPRAZolam, HYDROcodone-acetaminophen, zolpidem Assessment/Plan:  #Disposition: Short term SNF placement>>>SW to follow>>>possible early next week after EGD/Colonoscopy on Monday   1) s/p Fall 2/2 Syncope:   Etiology is likely multifactorial including her moderate to severe anemia vs chronic unsteady gait vs narcotic effect.  Patient already feels better after 2 units PRBC transfusion.   Plan:  - Tele  - Falls precaution and PT therapy  - correction of possible etiologies  For anemia>>>Hb 8.8 after 2 units PRBCs, FOBT negative, and GI consulted for ?EGD/ Colonoscopy  For chronic unsteady gait>>>no focal neuro deficit>>PT therapy  For ? Narcotics>>>on Vicotin for her FM ( allergic to percocet and tramadol).  No NSAIDS >>>chronic coumadin therapy and ? GIB  - Short term SNF placement  2) Anemia: Iron Deficiency Anemia - Ferritin 11 in 7/11.  - unclear source of her anemia other than iron deficiency. Patient states that she has had 3-4 times blood transfusions in the past and she is till anemic  -FOBT negative on admission  -Hb better after 2 units PRBCs  - [facility] for EGD/colonoscopy on Monday  - Coumadin on hold nowI. (Coumadin is for her P. A fib and she is NSR now).  3) Paroxysmal A- Fib: Currently in NSR.  S/p LA Maze procedure 09/2010.  - continue Tele  - Continue Amiodarone 200 mg daily  - hold coumadin for now for EGD/ Colonoscopy  4) Carotid Artery Disease: S/p R endarterectomy 07/2010.  Carotid Doppler- 12/12- R Carotid 40-59% stenoses and L 60-79%.  - Followed by [facility]- next Appt in July.  5) DM 2: Fairly controlled. Last A1c 8.5 in 2/13  Continue home Levemir and start SSI moderate.  6) COPD: Continue O2 and home inhalers.  7) CHF- diastolic: Grade 2.  - Continue Lasix.  8) HTN: Cont home meds.  9) HLD  statin   LOS: 4 days   LI, NA 05/11/2011, 9:25  AM

## 2011-05-11 NOTE — Progress Notes (Signed)
[facility] PROGRESS NOTE Subjective Patient taking clear liquids. Colonoscopy prep to start 1 PM today. No real complaints.  Objective: Vital signs in last 24 hours: Temp:  [97.3 F (36.3 C)-97.8 F (36.6 C)] 97.3 F (36.3 C) (03/31 0925) Pulse Rate:  [73-85] 79  (03/31 0925) Resp:  [18-20] 18  (03/31 0925) BP: (117-150)/(66-86) 145/86 mmHg (03/31 0925) SpO2:  [94 %-100 %] 99 % (03/31 0925) Weight:  [84.2 kg (185 lb 10 oz)] 84.2 kg (185 lb 10 oz) (03/30 2202) Last BM Date: 05/07/11  Intake/Output from previous day: 03/30 0701 - 03/31 0700 In: 1080 [P.O.:1080] Out: 5500 [Urine:5500] Intake/Output this shift: Total I/O In: 450 [P.O.:450] Out: 250 [Urine:250]    Lab Results:  [facility] 05/11/11 0938 05/09/11 0520  WBC 8.6 6.6  HGB 9.0* 9.3*  HCT 29.8* 30.4*  PLT 211 220   BMET  Basename 05/10/11 0700 05/09/11 0520  NA 138 139  K 3.7 4.0  CL 97 98  CO2 32 32  CREATININE 1.04 1.00   LFT No results found for this basename: PROT:3ALBUMIN:3,AST:3,ALT:3,ALKPHOS:3,BILITOT:3,BILIDIR:3,IBILI:3 in the last 72 hours PT/INR  Basename 05/11/11 0707 05/10/11 0700 05/09/11 0520  LABPROT 16.5* 21.0* 24.7*  INR 1.31 1.78* 2.19*   PANCREAS No results found for this basename: LIPASE:3 in the last 72 hours       Studies/Results: No results found.  Medications: I have reviewed the patient's current medications.  Assessment/Plan: 1. Anemia/history of colon polyps. Patient's anemia is still unclear. She is overdue for followup colonoscopy for colon polyps and we will go ahead with that tomorrow. We have ordered labs to check for bone marrow production are still pending. Patient will have EGD and colonoscopy tomorrow at 8:00 by Dr. Michail Sermon. She went is aware the procedure we discussed with her again.   Emine Lopata JR,Lyndall Bellot L 05/11/2011, 10:31 AM

## 2011-05-11 NOTE — Progress Notes (Signed)
Patient says that she will placed cpap on herself and have RN to hook up the oxygen. RT will continue to monitor.

## 2011-05-12 ENCOUNTER — Encounter (HOSPITAL_COMMUNITY)
Admission: EM | Disposition: A | Discharge status: Skilled Nursing Facility | Payer: Self-pay | Source: Home / Self Care | Attending: Internal Medicine

## 2011-05-12 ENCOUNTER — Ambulatory Visit (HOSPITAL_COMMUNITY): Payer: Medicare Other

## 2011-05-12 ENCOUNTER — Encounter (HOSPITAL_COMMUNITY): Payer: Self-pay | Admitting: Gastroenterology

## 2011-05-12 HISTORY — PX: ESOPHAGOGASTRODUODENOSCOPY: SHX5428

## 2011-05-12 HISTORY — PX: COLONOSCOPY: SHX5424

## 2011-05-12 LAB — CBC
HCT: 30.4 % — ABNORMAL LOW (ref 36.0–46.0)
Hemoglobin: 9.1 g/dL — ABNORMAL LOW (ref 12.0–15.0)
MCH: 24.2 pg — ABNORMAL LOW (ref 26.0–34.0)
MCHC: 29.9 g/dL — ABNORMAL LOW (ref 30.0–36.0)
MCV: 80.9 fL (ref 78.0–100.0)
Platelets: 234 10*3/uL (ref 150–400)
RBC: 3.76 MIL/uL — ABNORMAL LOW (ref 3.87–5.11)
RDW: 18.2 % — ABNORMAL HIGH (ref 11.5–15.5)
WBC: 7.5 10*3/uL (ref 4.0–10.5)

## 2011-05-12 LAB — BASIC METABOLIC PANEL
BUN: 14 mg/dL (ref 6–23)
CO2: 30 mEq/L (ref 19–32)
Calcium: 9.3 mg/dL (ref 8.4–10.5)
Chloride: 101 mEq/L (ref 96–112)
Creatinine, Ser: 0.98 mg/dL (ref 0.50–1.10)
GFR calc Af Amer: 70 mL/min — ABNORMAL LOW (ref >90)
GFR calc non Af Amer: 61 mL/min — ABNORMAL LOW (ref >90)
Glucose, Bld: 103 mg/dL — ABNORMAL HIGH (ref 70–99)
Potassium: 3.5 mEq/L (ref 3.5–5.1)
Sodium: 140 mEq/L (ref 135–145)

## 2011-05-12 LAB — GLUCOSE, CAPILLARY
Glucose-Capillary: 98 mg/dL (ref 70–99)
Glucose-Capillary: 181 mg/dL — ABNORMAL HIGH (ref 70–99)
Glucose-Capillary: 157 mg/dL — ABNORMAL HIGH (ref 70–99)
Glucose-Capillary: 229 mg/dL — ABNORMAL HIGH (ref 70–99)

## 2011-05-12 LAB — PROTIME-INR
INR: 1.23 (ref 0.00–1.49)
Prothrombin Time: 15.8 seconds — ABNORMAL HIGH (ref 11.6–15.2)

## 2011-05-12 LAB — FOLATE RBC: RBC Folate: 1067 ng/mL — ABNORMAL HIGH (ref >=366)

## 2011-05-12 SURGERY — COLONOSCOPY
Anesthesia: Moderate Sedation

## 2011-05-12 MED ORDER — MIDAZOLAM HCL 10 MG/2ML IJ SOLN
INTRAMUSCULAR | Status: DC | PRN
  Administered 2011-05-12 (×3): 1 mg via INTRAVENOUS
  Administered 2011-05-12: 2 mg via INTRAVENOUS
  Administered 2011-05-12 (×2): 1 mg via INTRAVENOUS

## 2011-05-12 MED ORDER — FUROSEMIDE 80 MG PO TABS
80.0000 mg | ORAL_TABLET | Freq: Two times a day (BID) | ORAL | Status: DC
  Administered 2011-05-12 – 2011-05-13 (×2): 80 mg via ORAL
  Filled 2011-05-12 (×4): qty 1

## 2011-05-12 MED ORDER — FENTANYL NICU IV SYRINGE 50 MCG/ML
INJECTION | INTRAMUSCULAR | Status: DC | PRN
  Administered 2011-05-12 (×3): 12.5 ug via INTRAVENOUS
  Administered 2011-05-12: 25 ug via INTRAVENOUS

## 2011-05-12 MED ORDER — PROMETHAZINE HCL 25 MG/ML IJ SOLN
INTRAMUSCULAR | Status: AC
  Filled 2011-05-12: qty 1

## 2011-05-12 MED ORDER — SODIUM CHLORIDE 0.9 % IV SOLN
INTRAVENOUS | Status: DC
  Administered 2011-05-12: 500 mL via INTRAVENOUS

## 2011-05-12 MED ORDER — FENTANYL CITRATE 0.05 MG/ML IJ SOLN
INTRAMUSCULAR | Status: AC
  Filled 2011-05-12: qty 4

## 2011-05-12 MED ORDER — DIPHENHYDRAMINE HCL 50 MG/ML IJ SOLN
INTRAMUSCULAR | Status: DC | PRN
  Administered 2011-05-12: 12.5 mg via INTRAVENOUS

## 2011-05-12 MED ORDER — FERROUS GLUCONATE 324 (38 FE) MG PO TABS
324.0000 mg | ORAL_TABLET | Freq: Two times a day (BID) | ORAL | Status: DC
  Administered 2011-05-12 – 2011-05-13 (×2): 324 mg via ORAL
  Filled 2011-05-12 (×4): qty 1

## 2011-05-12 MED ORDER — ALPRAZOLAM 0.5 MG PO TABS
0.5000 mg | ORAL_TABLET | Freq: Three times a day (TID) | ORAL | Status: DC | PRN
  Administered 2011-05-12: 0.5 mg via ORAL
  Administered 2011-05-13: 1 mg via ORAL
  Filled 2011-05-12: qty 2

## 2011-05-12 MED ORDER — DIPHENHYDRAMINE HCL 50 MG/ML IJ SOLN
INTRAMUSCULAR | Status: AC
  Filled 2011-05-12: qty 1

## 2011-05-12 MED ORDER — MIDAZOLAM HCL 10 MG/2ML IJ SOLN
INTRAMUSCULAR | Status: AC
  Filled 2011-05-12: qty 4

## 2011-05-12 NOTE — Op Note (Signed)
[facility] [place], [place]  [address]  COLONOSCOPY PROCEDURE REPORT  PATIENT:  [name], [name]  MR#:  [id number] BIRTHDATE:  May 13, 1950, 61 yrs. old  GENDER:  female ENDOSCOPIST:  Wilford Corner, MD REF. BY: PROCEDURE DATE:  05/12/2011 PROCEDURE:  Colonoscopy with snare polypectomy ASA CLASS:  Class III INDICATIONS:  Anemia, history of pre-cancerous (adenomatous) colon polyps MEDICATIONS:   Fentanyl 50 mcg IV, Versed 6 mg IV, Benadryl 12.5 mg IV  DESCRIPTION OF PROCEDURE:   After the risks benefits and alternatives of the procedure were thoroughly explained, informed consent was obtained.  The Pentax Ped Colon [id number] was introduced through the anus and advanced to the cecum, which was identified by both the appendix and ileocecal valve, limited by a tortuous colon, looping difficuties.    The quality of the prep was fair. The instrument was then slowly withdrawn as the colon was fully examined. <<PROCEDUREIMAGES>>  FINDINGS:  Rectal exam unremarkable.  Pediatric colonoscope inserted into the colon and advanced to the cecum, where the appendiceal orifice and ileocecal valve were identified.    On careful withdrawal of the colonoscope a 4 mm sessile polyp was seen in the ascending colon. Snare cautery was used to remove this polyp, which was successful without any immediate complications. Retroflexion revealed small internal hemorrhoids.  COMPLICATIONS:  None  IMPRESSION:     1. Colon polyp- s/p snare cautery 2. Small internal hemorrhoids 3. No source of anemia seen  RECOMMENDATIONS:  1. F/U on path 2. Proceed with EGD  ______________________________ Wilford Corner, MD  CC:  n. eSIGNEDWilford Corner at 05/12/2011 10:19 AM  Erik Obey, [phone]

## 2011-05-12 NOTE — Brief Op Note (Signed)
See endopro notes for details. No source of anemia on colon/EGD. F/U on duodenal biopsies as outpt. Ok to advance diet. Suspect occasional trouble swallowing pills due to esophageal dysmotility and no esophageal stricture or ring seen.

## 2011-05-12 NOTE — H&P (View-Only) (Signed)
[facility] PROGRESS NOTE Subjective Patient taking clear liquids. Colonoscopy prep to start 1 PM today. No real complaints.  Objective: Vital signs in last 24 hours: Temp:  [97.3 F (36.3 C)-97.8 F (36.6 C)] 97.3 F (36.3 C) (03/31 0925) Pulse Rate:  [73-85] 79  (03/31 0925) Resp:  [18-20] 18  (03/31 0925) BP: (117-150)/(66-86) 145/86 mmHg (03/31 0925) SpO2:  [94 %-100 %] 99 % (03/31 0925) Weight:  [84.2 kg (185 lb 10 oz)] 84.2 kg (185 lb 10 oz) (03/30 2202) Last BM Date: 05/07/11  Intake/Output from previous day: 03/30 0701 - 03/31 0700 In: 1080 [P.O.:1080] Out: 5500 [Urine:5500] Intake/Output this shift: Total I/O In: 450 [P.O.:450] Out: 250 [Urine:250]    Lab Results:  [facility] 05/11/11 0938 05/09/11 0520  WBC 8.6 6.6  HGB 9.0* 9.3*  HCT 29.8* 30.4*  PLT 211 220   BMET  Basename 05/10/11 0700 05/09/11 0520  NA 138 139  K 3.7 4.0  CL 97 98  CO2 32 32  CREATININE 1.04 1.00   LFT No results found for this basename: PROT:3ALBUMIN:3,AST:3,ALT:3,ALKPHOS:3,BILITOT:3,BILIDIR:3,IBILI:3 in the last 72 hours PT/INR  Basename 05/11/11 0707 05/10/11 0700 05/09/11 0520  LABPROT 16.5* 21.0* 24.7*  INR 1.31 1.78* 2.19*   PANCREAS No results found for this basename: LIPASE:3 in the last 72 hours       Studies/Results: No results found.  Medications: I have reviewed the patient's current medications.  Assessment/Plan: 1. Anemia/history of colon polyps. Patient's anemia is still unclear. She is overdue for followup colonoscopy for colon polyps and we will go ahead with that tomorrow. We have ordered labs to check for bone marrow production are still pending. Patient will have EGD and colonoscopy tomorrow at 8:00 by Dr. Michail Sermon. She went is aware the procedure we discussed with her again.   Jaivion Kingsley JR,Trenice Mesa L 05/11/2011, 10:31 AM

## 2011-05-12 NOTE — Progress Notes (Signed)
Physical Therapy Treatment Patient Details Name: [name] MRN: [id number] DOB: 1950/10/03 Today's Date: 05/12/2011  PT Assessment/Plan  PT - Assessment/Plan Comments on Treatment Session: Pt improving with mobility but continues to have difficulty with her activity tolerance. Pt would benefit from short term SNF placement to maximize cardio-pulmonary tolerance to activity and reduce risk of fall at home due to oxygen desaturation.  PT Plan: Discharge plan remains appropriate;Frequency remains appropriate PT Frequency: Min 3X/week Follow Up Recommendations: Skilled nursing facility;Supervision/Assistance - 24 hour Equipment Recommended: Defer to next venue PT Goals  Acute Rehab PT Goals PT Goal Formulation: With patient Time For Goal Achievement: 2 weeks Pt will Ambulate: >150 feet;with modified independence;with least restrictive assistive device PT Goal: Ambulate - Progress: Progressing toward goal Pt will Perform Home Exercise Program: Independently PT Goal: Perform Home Exercise Program - Progress: Progressing toward goal  PT Treatment Precautions/Restrictions  Precautions Precautions: Fall Precaution Comments: Pt fell at home prior to admission Restrictions Weight Bearing Restrictions: No Mobility (including Balance) Bed Mobility Bed Mobility: No Transfers Transfers: Yes Sit to Stand: 7: Independent;From bed;With upper extremity assist;From toilet Stand to Sit: 7: Independent Ambulation/Gait Ambulation/Gait: Yes Ambulation/Gait Assistance: 5: Supervision Ambulation/Gait Assistance Details (indicate cue type and reason): Supervision for safety Ambulation Distance (Feet): 120 Feet (120 feet x 2) Assistive device: None Gait Pattern: Within Functional Limits Stairs: No Wheelchair Mobility Wheelchair Mobility: No  Posture/Postural Control Posture/Postural Control: No significant limitations Balance Balance Assessed: No Exercise    End of Session PT - End of  Session Equipment Utilized During Treatment: Gait belt Activity Tolerance: Patient limited by fatigue (O2 sats dropped to 90% on 1L O2 via [place]) Patient left: in chair;with call bell in reach Nurse Communication: Mobility status for transfers;Mobility status for ambulation General Behavior During Session: [facility] for tasks performed Cognition: [facility] for tasks performed  Jaima Janney 05/12/2011, 4:31 PM Yaremi Stahlman L. Arbadella Kimbler DPT [phone]

## 2011-05-12 NOTE — Interval H&P Note (Signed)
History and Physical Interval Note:  05/12/2011 9:08 AM  [name]  has presented today for surgery, with the diagnosis of anemia, hist of polyps  The various methods of treatment have been discussed with the patient and family. After consideration of risks, benefits and other options for treatment, the patient has consented to  Procedure(s) (LRB): COLONOSCOPY (N/A) ESOPHAGOGASTRODUODENOSCOPY (EGD) (N/A) as a surgical intervention .  The patients' history has been reviewed, patient examined, no change in status, stable for surgery.  I have reviewed the patients' chart and labs.  Questions were answered to the patient's satisfaction.     [place] C.

## 2011-05-12 NOTE — Progress Notes (Signed)
Subjective: Patient is using bathroom after her procedures, has no acute complaints aside from gas. Denies nausea. Is excited to advance diet.  Objective: Vital signs in last 24 hours: Filed Vitals:   05/12/11 1005 05/12/11 1010 05/12/11 1020 05/12/11 1105  BP: 140/81 104/38 115/46 113/74  Pulse:    76  Temp:    97.8 F (36.6 C)  TempSrc:    Oral  Resp: 40 _0 Height:      Weight:      SpO2: 99% 98% 100% 95%   Weight change:   Intake/Output Summary (Last 24 hours) at 05/12/11 1207 Last data filed at 05/12/11 0826  Gross per 24 hour  Intake    790 ml  Output    406 ml  Net    384 ml   Physical Exam: General: sitting up in bed HEENT: PERRL, EOMI, no scleral icterus Cardiac: RRR, no rubs, murmurs or gallops Pulm: clear to auscultation bilaterally, moving normal volumes of air Abd: soft, nontender, nondistended, BS hyperactive Ext: warm and well perfused, no pedal edema Neuro: alert and oriented X3, cranial nerves II-XII grossly intact  Lab Results: Basic Metabolic Panel:  Lab 61/44/31 0518 05/10/11 0700 05/09/11 0520  NA 140 138 --  K 3.5 3.7 --  CL 101 97 --  CO2 30 32 --  GLUCOSE 103* 236* --  BUN 14 22 --  CREATININE 0.98 1.04 --  CALCIUM 9.3 9.1 --  MG -- -- 2.1  PHOS -- -- --   Liver Function Tests:  Lab 05/07/11 1518  AST 15  ALT 20  ALKPHOS 66  BILITOT 0.2*  PROT 7.3  ALBUMIN 3.9   CBC:  Lab 05/12/11 0518 05/11/11 0938  WBC 7.5 8.6  NEUTROABS -- --  HGB 9.1* 9.0*  HCT 30.4* 29.8*  MCV 80.9 81.0  PLT 234 211   Cardiac Enzymes:  Lab 05/07/11 1715  CKTOTAL --  CKMB --  CKMBINDEX --  TROPONINI <0.30   CBG:  Lab 05/12/11 0813 05/11/11 2112 05/11/11 1641 05/11/11 1225 05/11/11 0749 05/11/11 0355  GLUCAP 98 180* 166* 206* 137* 188*   Coagulation:  Lab 05/12/11 0518 05/11/11 0707 05/10/11 0700 05/09/11 0520  LABPROT 15.8* 16.5* 21.0* 24.7*  INR 1.23 1.31 1.78* 2.19*   Anemia Panel:  Lab 05/11/11 0707  VITAMINB12 488    FOLATE --  FERRITIN 19  TIBC 449  IRON 40*  RETICCTPCT 3.6*   Urine Drug Screen: Drugs of Abuse     Component Value Date/Time   LABOPIA POSITIVE* 03/27/2009 0133   COCAINSCRNUR NONE DETECTED 03/27/2009 0133   LABBENZ POSITIVE* 03/27/2009 0133   AMPHETMU NONE DETECTED 03/27/2009 0133   THCU NONE DETECTED 03/27/2009 0133   LABBARB  Value: NONE DETECTED        DRUG SCREEN FOR MEDICAL PURPOSES ONLY.  IF CONFIRMATION IS NEEDED FOR ANY PURPOSE, NOTIFY LAB WITHIN 5 DAYS.        LOWEST DETECTABLE LIMITS FOR URINE DRUG SCREEN Drug Class       Cutoff (ng/mL) Amphetamine      1000 Barbiturate      200 Benzodiazepine   540 Tricyclics       086 Opiates          300 Cocaine          300 THC              50 03/27/2009 0133    Medications: I have reviewed the patient's current medications. Scheduled Meds:   .  amiodarone  200 mg Oral Daily  . aspirin EC  81 mg Oral Daily  . atorvastatin  40 mg Oral q1800  . buPROPion  150 mg Oral Daily  . docusate sodium  100 mg Oral BID  . DULoxetine  90 mg Oral Daily  . Fluticasone-Salmeterol  1 puff Inhalation BID  . furosemide  80 mg Oral BID  . gabapentin  300 mg Oral BID  . insulin aspart  0-15 Units Subcutaneous TID WC  . insulin aspart  0-5 Units Subcutaneous QHS  . insulin detemir  45 Units Subcutaneous QHS  . loratadine  10 mg Oral Daily  . pantoprazole  80 mg Oral BID AC  . polyethylene glycol  17 g Oral BID  . polyethylene glycol-electrolytes  4,000 mL Oral Once  . potassium chloride SA  20 mEq Oral TID  . sodium chloride  3 mL Intravenous Q12H  . tiotropium  18 mcg Inhalation Daily   Continuous Infusions:   . DISCONTD: sodium chloride 500 mL (05/12/11 0826)   PRN Meds:.acetaminophen, acetaminophen, albuterol, ALPRAZolam, HYDROcodone-acetaminophen, zolpidem, DISCONTD: diphenhydrAMINE, DISCONTD: fentaNYL, DISCONTD: midazolam Assessment/Plan: Active Problems:  HYPERTENSION- well controlled currently  Diabetes mellitus type II- well controlled  currently  COPD (chronic obstructive pulmonary disease)  Anemia- stable, Hgb 9.1  Falls frequent- have recommended SNF and patient wants her sister to evaluate the SNF before she agrees to go tomorrow  A fib- will restart coumadin after discussion with GI tomorrow   LOS: 5 days   Hans Eden 05/12/2011, 12:07 PM

## 2011-05-12 NOTE — Op Note (Signed)
[facility] [place], [place]  [address]  ENDOSCOPY PROCEDURE REPORT  PATIENT:  [name], [name]  MR#:  [id number] BIRTHDATE:  Dec 18, 1950, 61 yrs. old  GENDER:  female  ENDOSCOPIST:  Wilford Corner, MD Referred by:  PROCEDURE DATE:  05/12/2011 PROCEDURE:  EGD with biopsy, 43239 ASA CLASS:  Class III INDICATIONS:  anemia  MEDICATIONS:   Fentanyl 12.5 mcg IV, Versed 1 mg IV, see preceding colonoscopy report  TOPICAL ANESTHETIC: none  DESCRIPTION OF PROCEDURE:   After the risks benefits and alternatives of the procedure were thoroughly explained, informed consent was obtained.  The Pentax Gastroscope [id number] endoscope was introduced through the mouth and advanced to the second portion of the duodenum, without limitations.  The instrument was slowly withdrawn as the mucosa was fully examined. <<PROCEDUREIMAGES>>  FINDINGS:  The endoscope was inserted into the oropharynx and esophagus was intubated.  The gastroesophageal junction was noted to be 40 cm from the incisors.  Endoscope was advanced into the stomach, which revealed normal-appearing gastric mucosa. The endoscope was advanced to the duodenal bulb and second portion of duodenum which were unremarkable. Duodenal biopsies were taken to check for celiac sprue. The endoscope was withdrawn back into the stomach and retroflexion revealed a normal proximal stomach. The endoscope was withdrawn to confirm the above findings.  COMPLICATIONS:  None  ENDOSCOPIC IMPRESSION:  1. Normal EGD -s/p biopsies to check for celiac sprue  RECOMMENDATIONS:    1. F/U on path 2. Advance diet  REPEAT EXAM:  N/A  ______________________________ Wilford Corner, MD  CC:  n. eSIGNEDWilford Corner at 05/12/2011 10:26 AM  Erik Obey, [phone]

## 2011-05-12 NOTE — Progress Notes (Signed)
Internal Medicine Attending  Date: 05/12/2011  Patient name: [name] Medical record number: [id number] Date of birth: 09/08/1950 Age: 61 y.o. Gender: female  I saw and evaluated the patient. I reviewed the resident's note by Dr. Marcello Moores and I agree with the resident's findings and plans as documented in her note.

## 2011-05-13 ENCOUNTER — Encounter (HOSPITAL_COMMUNITY): Payer: Self-pay | Admitting: Gastroenterology

## 2011-05-13 LAB — CBC
HCT: 29.6 % — ABNORMAL LOW (ref 36.0–46.0)
Hemoglobin: 8.8 g/dL — ABNORMAL LOW (ref 12.0–15.0)
MCH: 24.4 pg — ABNORMAL LOW (ref 26.0–34.0)
MCHC: 29.7 g/dL — ABNORMAL LOW (ref 30.0–36.0)
MCV: 82 fL (ref 78.0–100.0)
Platelets: 207 10*3/uL (ref 150–400)
RBC: 3.61 MIL/uL — ABNORMAL LOW (ref 3.87–5.11)
RDW: 18.4 % — ABNORMAL HIGH (ref 11.5–15.5)
WBC: 8.9 10*3/uL (ref 4.0–10.5)

## 2011-05-13 LAB — PROTIME-INR
INR: 1.15 (ref 0.00–1.49)
Prothrombin Time: 14.9 seconds (ref 11.6–15.2)

## 2011-05-13 LAB — GLUCOSE, CAPILLARY
Glucose-Capillary: 232 mg/dL — ABNORMAL HIGH (ref 70–99)
Glucose-Capillary: 212 mg/dL — ABNORMAL HIGH (ref 70–99)

## 2011-05-13 MED ORDER — FERROUS GLUCONATE 324 (38 FE) MG PO TABS: 324.0000 mg | ORAL_TABLET | Freq: Two times a day (BID) | ORAL | Status: DC

## 2011-05-13 NOTE — Discharge Summary (Signed)
Please note that warfarin is on hold at the time of discharge as per GI consult, with recommendation that warfarin be restarted on April 5th.

## 2011-05-13 NOTE — Telephone Encounter (Signed)
A user error has taken place: encounter opened in error, closed for administrative reasons.

## 2011-05-13 NOTE — Progress Notes (Signed)
Patient ID: [name], female   DOB: Jan 21, 1951, 61 y.o.   MRN: [id number] [facility] Progress Note  [name] 61 y.o. [birth year]   Subjective: Sitting on side of bed and eating. No complaints.  Objective: Vital signs: Filed Vitals:   05/13/11 0957  BP: 103/51  Pulse: 72  Temp: 97.8 F (36.6 C)  Resp: 15    Intake/Output last 24 hrs:  Intake/Output Summary (Last 24 hours) at 05/13/11 1308 Last data filed at 05/13/11 0800  Gross per 24 hour  Intake   1320 ml  Output      0 ml  Net   1320 ml    Physical Exam: Gen: alert, no acute distress  Lab Results:  [facility] 05/12/11 0518  NA 140  K 3.5  CL 101  CO2 30  GLUCOSE 103*  BUN 14  CREATININE 0.98  CALCIUM 9.3  MG --  PHOS --   No results found for this basename: AST:2,ALT:2,ALKPHOS:2,BILITOT:2,PROT:2,ALBUMIN:2 in the last 72 hours  Basename 05/13/11 0545 05/12/11 0518  WBC 8.9 7.5  NEUTROABS -- --  HGB 8.8* 9.1*  HCT 29.6* 30.4*  MCV 82.0 80.9  PLT 207 234     Assessment/Plan: 61yo with anemia s/p egd/colon. No definite source of anemia on GI procs (duodenal biopsies pending). Agree with iron supplementation. Likely needs outpt hematology eval but defer timing to primary team. Hold Coumadin until 05/16/11. Will call polyp and biopsy results as outpt. Will sign off. Call if questions.   [place] C. 05/13/2011, 1:08 PM

## 2011-05-13 NOTE — Progress Notes (Signed)
PT Cancellation Note  Treatment cancelled today due to patient stating that she had already been up and ambulating this AM. Also stated she was worn out from bathing and politely declined PT at this time. Patient with possible DC to [facility] this afternoon.   [name] 05/13/2011, 1:19 PM 05/13/2011 [name] PTA [phone] pager [phone] office

## 2011-05-13 NOTE — Progress Notes (Signed)
Clinical Social Work-CSW facilitated pt d/c to [facility] with sister providing transport and chart copy-No further needs at this time-Laurabelle Gorczyca-MSW, [phone]

## 2011-05-13 NOTE — Progress Notes (Signed)
Discharge Note:  S: patient is feeling well aside from not sleeping well  O:  Filed Vitals:   05/13/11 0957  BP: 103/51  Pulse: 72  Temp: 97.8 F (36.6 C)  Resp: 15  General: obese pleasant woman who is in no acute distress HEENT: PERRL, EOMI, no scleral icterus Cardiac: RRR, no rubs, murmurs or gallops Pulm: clear to auscultation bilaterally, moving normal volumes of air Abd: soft, nontender, nondistended, BS present Ext: warm and well perfused, no pedal edema Neuro: alert and oriented X3, cranial nerves II-XII grossly intact  A/P:  -will discharge to [facility] for acute rehabilitation due to low stamina and frequent falls.  -Patient started on iron gluconate to improve her iron deficiency anemia  -Patient's colonic polyp pathology report will be followed up as an outpatient

## 2011-05-13 NOTE — Discharge Summary (Addendum)
[facility] Discharge Note  Name: [name] MRN: [id number] DOB: 01/15/1951 61 y.o.  Date of Admission: 05/07/2011  3:08 PM Date of Discharge:  Attending Physician: Axel Filler, MD  Discharge Diagnoses: Frequent falls-  Anemia- iron deficiency- please follow up colonic polyp pathology report Atrial fibrillation- on coumadin COPD  CHF- diastolic dysfunction DM  Discharge Medications: Medication List  As of 05/13/2011 11:55 AM   TAKE these medications         ACCU-CHEK FASTCLIX LANCET Kit   1 each by Does not apply route 3 (three) times daily.      ACCU-CHEK FASTCLIX LANCETS Misc   1 each by Does not apply route 3 (three) times daily before meals. Dx code- 250.00      albuterol 90 MCG/ACT inhaler   Commonly known as: PROVENTIL,VENTOLIN   Inhale 1-2 puffs into the lungs every 4 (four) hours as needed.      ALPRAZolam 1 MG tablet   Commonly known as: XANAX   Take 1 tablet (1 mg total) by mouth 3 (three) times daily as needed for sleep or anxiety.      amiodarone 200 MG tablet   Commonly known as: PACERONE   Take 1 tablet (200 mg total) by mouth daily.      aspirin 81 MG tablet   Take 81 mg by mouth daily.      buPROPion 150 MG 24 hr tablet   Commonly known as: WELLBUTRIN XL   Take 150 mg by mouth at bedtime.      docusate sodium 100 MG capsule   Commonly known as: COLACE   Take 1 capsule (100 mg total) by mouth 2 (two) times daily.      DULoxetine 30 MG capsule   Commonly known as: CYMBALTA   Take 3 tablets by mouth once daily      ferrous gluconate 324 MG tablet   Commonly known as: FERGON   Take 1 tablet (324 mg total) by mouth 2 (two) times daily with a meal.      Fluticasone-Salmeterol 250-50 MCG/DOSE Aepb   Commonly known as: ADVAIR   Inhale 1 puff into the lungs every 12 (twelve) hours.      furosemide 80 MG tablet   Commonly known as: LASIX   Take 1 tablet (80 mg total) by mouth 2 (two) times daily.     gabapentin 300 MG capsule   Commonly known as: NEURONTIN   Take 1 capsule (300 mg total) by mouth 2 (two) times daily.      glucose blood test strip   Use to test blood sugar 3 times daily.      HYDROcodone-acetaminophen 5-325 MG per tablet   Commonly known as: NORCO   1-2 tablets every 6 hours as needed for pain. Do not take with alcohol, do no drive or operate heavy machinery if drowsiness occurs. Do not take more than instructed.      insulin aspart 100 UNIT/ML injection   Commonly known as: novoLOG   7 units to cover 3-4 carb choices each meal 2-3x a day Before meals. Add correction amount of Novolog to food Novolog if needed up to a total of 30 units per day: If blood sugar is: < 150= no correction, if 151-200 add 2 unit, if 201-250 add 4 units, if 251-300 add 6 units, if 301-350 add 8 units, if 351-400 add 10 units, if 401- 450 add 12 units, if > 450 add 14 units and please  call office for instructions.      insulin detemir 100 UNIT/ML injection   Commonly known as: LEVEMIR   Inject 45 Units into the skin at bedtime. DISPENSE 2 VIALS/ MONTH      Insulin Pen Needle 31G X 5 MM Misc   Please check blood sugars 4 times daily. Before breakfast and all meals.      INSULIN SYRINGE .3CC/31GX5/16" 31G X 5/16" 0.3 ML Misc   1 Syringe by Does not apply route 2 (two) times daily at 10 AM and 5 PM.      INSULIN SYRINGE 1CC/30GX1/2" 30G X 1/2" 1 ML Misc   1 Container by Does not apply route 2 (two) times daily.      loratadine 10 MG tablet   Commonly known as: CLARITIN   Take 1 tablet (10 mg total) by mouth daily.      omeprazole 40 MG capsule   Commonly known as: PRILOSEC   Take 40 mg by mouth 2 (two) times daily.      potassium chloride SA 20 MEQ tablet   Commonly known as: K-DUR,KLOR-CON   Take 1 tablet (20 mEq total) by mouth 3 (three) times daily.      PRESCRIPTION MEDICATION   02 one liter as needed      rosuvastatin 20 MG tablet   Commonly known as: CRESTOR   Take 1 tablet  (20 mg total) by mouth daily.      tiotropium 18 MCG inhalation capsule   Commonly known as: SPIRIVA   Place 18 mcg into inhaler and inhale daily.      warfarin 2 MG tablet   Commonly known as: COUMADIN   TAKE AS DIRECTED BY ANTICOAGULATION CLINIC beginning on April 5th (post-endoscopy/colonoscopy)            Disposition and follow-up:   Ms.Anaise A Wojcicki was discharged from [facility] in Stable condition.    Follow-up Appointments: Follow-up Information    Follow up with KALIA-REYNOLDS, SHELLY, DO on 05/29/2011. (Appt mad for 1:15 with Dr. Guy Sandifer)         Discharge Orders    Future Appointments: Provider: Department: Dept Phone: Center:   05/29/2011 1:15 PM Annamarie Dawley, DO [facility] [phone] [facility]   06/02/2011 8:30 AM [facility] [phone] None   06/02/2011 8:45 AM Imogene Burn, [place] [phone] [facility]   08/11/2011 1:30 PM [facility] [phone] [facility]   02/09/2012 3:00 PM [facility] [phone] [facility]   02/09/2012 4:00 PM Serafina Mitchell, MD [facility] [phone] [facility]     Future Orders Please Complete By Expires   Diet - low sodium heart healthy      Increase activity slowly      Discharge instructions      Comments:   Make an effort to exercise daily as mch as you can tolerate. This is important for maintaining your strength and balance.   (HEART FAILURE PATIENTS) Call MD:  Anytime you have any of the following symptoms: 1) 3 pound weight gain in 24 hours or 5 pounds in 1 week 2) shortness of breath, with or without a dry hacking cough 3) swelling in the hands, feet or stomach 4) if you have to sleep on extra pillows at night in order to breathe.      Call MD for:  extreme fatigue      Call MD for:      Comments:  Call physician for blood in bowel movements or black, tarry appearing stool. This is an important sign of bleeding.        Consultations: Treatment Team:  Winfield Cunas., MD  Procedures Performed:  Dg Chest 2 View  05/07/2011  *RADIOLOGY REPORT*  Clinical Data: Shortness of breath.  CHEST - 2 VIEW  Comparison: 11/13/2010.  Findings: The cardiac silhouette, mediastinal and hilar contours are within normal limits and stable.  Stable mild elevation of the left hemidiaphragm with overlying atelectasis. No pneumothorax.  IMPRESSION: Persistent elevation of the left hemidiaphragm with overlying atelectasis.  Original Report Authenticated By: P. Kalman Jewels, M.D.   Admission HPI:  Pt is a 61 yo woman with complicated PMH with PVD, Carotid disease, Afib, MVR, DM2 who comes to the ER with chief complaint of fall 2 days before.  She called outpatient clinic this afternoon with complaints of high sugars in 200s to 300s for past few days and mentioned about fall 2 days before.  She describes the incident as she woke up lying on the floor, on her face, in her kitchen about 2 AM on Monday morning. Does not remember much about anything prior to fall. Says she was definitely not nauseous, had no changes in vision or dizziness before the fall. Also when she woke up, she exactly new where she was and was not confused at all. She called her neighbor for help.  She refuses any post fall headache, focal weakness or numbness.  She denies any recent fevers, chills, nausea vomiting, chest pain, shortness of breath, abdominal pain, diarrhea.  Did not notice any blood in stool.  Also reports her CBGs running in 200s to 300s for past few days- since she missed one dose of Levemir.  Also c/o hearing Heart Beat in her head.   Hospital Course by problem list: # frequent falls    Patient presented with frequent falls on admission. After extensive monitoring and evaluation, the etiology of her falls is likely multifactorial including her moderate to severe anemia, chronic gait instability and possible narcotic effect.  She was  given 2 units PRBC during the admission and GI was consulted for EGD and colonoscopy which showed no endoscopic abnormalities and a single 83m polyp on colonoscopy which was removed and sent for pathology. She reports that she does very little activity at baseline and mostly rests and plays on her computer.  We also consulted physical therapy for gait and balance training. She will be discharged to short term SNF for continued stamina building.  # Anemia    Patient was noted to have iron deficiency anemia in 2011. Her last Hb was 10 in October 2012. She presented with Hb of 7.1 on admission without obvious sources. Patient reported 3-4 times blood transfusion in the past with unclear source of her anemia. Given her history of colon polyps, GI was consulted for EGD and Colonoscopy.was performed which showed repeat polyp but no active sources of bleeding. Patient had been tried on iron in the past but had gastrointestinal discomfort. We started her on iron gluconate while in house and this may be better tolerated. Please follow up pathology report at her first outpatient visit to determine how soon screening colonoscopy is warranted.    # Paroxysmal A- Fib: Currently in NSR.  S/p LA Maze procedure 09/2010. She has been on chronic coumadin, which was temporarily on hold for her EGD and Colonoscopy during this admission. She is stable upon discharge and her coumadin will be  restarted 5 days after her procedure which will be April 5th 2013. She is followed by Dr. Stanford Breed of [facility] for her cardiac and INR monitoring.   # Carotid Artery Disease:  S/p R endarterectomy 07/2010.  Carotid Doppler in 12/12 showed R Carotid 40-59% stenoses and L 60-79%.  She has been followed by [facility] with next Appt in July.  # DM 2: Fairly controlled. Last A1c 8.5 in 2/13  Continue home regimen. She may need further titration for better control once she is back on her typical diet. # COPD: Continue O2 and home inhalers.  # CHF-  diastolic: Grade 2.  - Continue Lasix.  # HTN: Cont home meds.  # HLD, continue statin.   Discharge Vitals:  BP 103/51  Pulse 72  Temp(Src) 97.8 F (36.6 C) (Oral)  Resp 15  Ht _0  (1.575 m)  Wt 182 lb 1.6 oz (82.6 kg)  BMI 33.31 kg/m2  SpO2 94%  Discharge Labs:  Results for orders placed during the hospital encounter of 05/07/11 (from the past 24 hour(s))  GLUCOSE, CAPILLARY     Status: Abnormal   Collection Time   05/12/11 12:00 PM      Component Value Range   Glucose-Capillary 181 (*) 70 - 99 (mg/dL)   Comment 1 Documented in Chart     Comment 2 Notify RN    GLUCOSE, CAPILLARY     Status: Abnormal   Collection Time   05/12/11  4:34 PM      Component Value Range   Glucose-Capillary 157 (*) 70 - 99 (mg/dL)   Comment 1 Documented in Chart     Comment 2 Notify RN    GLUCOSE, CAPILLARY     Status: Abnormal   Collection Time   05/12/11  9:19 PM      Component Value Range   Glucose-Capillary 229 (*) 70 - 99 (mg/dL)  PROTIME-INR     Status: Normal   Collection Time   05/13/11  5:45 AM      Component Value Range   Prothrombin Time 14.9  11.6 - 15.2 (seconds)   INR 1.15  0.00 - 1.49   CBC     Status: Abnormal   Collection Time   05/13/11  5:45 AM      Component Value Range   WBC 8.9  4.0 - 10.5 (K/uL)   RBC 3.61 (*) 3.87 - 5.11 (MIL/uL)   Hemoglobin 8.8 (*) 12.0 - 15.0 (g/dL)   HCT 29.6 (*) 36.0 - 46.0 (%)   MCV 82.0  78.0 - 100.0 (fL)   MCH 24.4 (*) 26.0 - 34.0 (pg)   MCHC 29.7 (*) 30.0 - 36.0 (g/dL)   RDW 18.4 (*) 11.5 - 15.5 (%)   Platelets 207  150 - 400 (K/uL)  GLUCOSE, CAPILLARY     Status: Abnormal   Collection Time   05/13/11  8:03 AM      Component Value Range   Glucose-Capillary 232 (*) 70 - 99 (mg/dL)   Comment 1 Documented in Chart     Comment 2 Notify RN      SignedHans Eden 05/13/2011, 11:55 AM

## 2011-05-13 NOTE — Discharge Summary (Signed)
Please note that warfarin is on hold at the time of discharge as per GI consult, with recommendation that warfarin be restarted on April 5th.  

## 2011-05-13 NOTE — Progress Notes (Signed)
Pt refused CPAP at this time, no distress noted, will continue to monitor.

## 2011-05-14 ENCOUNTER — Ambulatory Visit (HOSPITAL_COMMUNITY): Payer: Medicare Other

## 2011-05-14 ENCOUNTER — Encounter: Payer: Self-pay | Admitting: Ophthalmology

## 2011-05-14 DIAGNOSIS — Z860100 Personal history of colon polyps, unspecified: Secondary | ICD-10-CM | POA: Insufficient documentation

## 2011-05-14 DIAGNOSIS — K439 Ventral hernia without obstruction or gangrene: Secondary | ICD-10-CM | POA: Insufficient documentation

## 2011-05-14 DIAGNOSIS — D369 Benign neoplasm, unspecified site: Secondary | ICD-10-CM | POA: Insufficient documentation

## 2011-05-14 DIAGNOSIS — G479 Sleep disorder, unspecified: Secondary | ICD-10-CM | POA: Insufficient documentation

## 2011-05-14 DIAGNOSIS — R159 Full incontinence of feces: Secondary | ICD-10-CM | POA: Insufficient documentation

## 2011-05-14 HISTORY — DX: Personal history of colon polyps, unspecified: Z86.0100

## 2011-05-14 HISTORY — DX: Personal history of colonic polyps: Z86.010

## 2011-05-16 ENCOUNTER — Ambulatory Visit (HOSPITAL_COMMUNITY): Payer: Medicare Other

## 2011-05-19 ENCOUNTER — Ambulatory Visit (HOSPITAL_COMMUNITY): Payer: Medicare Other

## 2011-05-19 ENCOUNTER — Ambulatory Visit: Payer: Medicare Other | Admitting: Cardiology

## 2011-05-21 ENCOUNTER — Ambulatory Visit (HOSPITAL_COMMUNITY): Payer: Medicare Other

## 2011-05-23 ENCOUNTER — Emergency Department (HOSPITAL_COMMUNITY): Payer: Medicare Other

## 2011-05-23 ENCOUNTER — Emergency Department (HOSPITAL_COMMUNITY)
Admission: EM | Admit: 2011-05-23 | Discharge: 2011-05-23 | Disposition: A | Discharge status: Home / Self Care | Payer: Medicare Other | Attending: Emergency Medicine | Admitting: Emergency Medicine

## 2011-05-23 ENCOUNTER — Ambulatory Visit (HOSPITAL_COMMUNITY): Payer: Medicare Other

## 2011-05-23 ENCOUNTER — Encounter (HOSPITAL_COMMUNITY): Payer: Self-pay | Admitting: Radiology

## 2011-05-23 DIAGNOSIS — E785 Hyperlipidemia, unspecified: Secondary | ICD-10-CM | POA: Insufficient documentation

## 2011-05-23 DIAGNOSIS — J4489 Other specified chronic obstructive pulmonary disease: Secondary | ICD-10-CM | POA: Insufficient documentation

## 2011-05-23 DIAGNOSIS — R51 Headache: Secondary | ICD-10-CM | POA: Insufficient documentation

## 2011-05-23 DIAGNOSIS — S1093XA Contusion of unspecified part of neck, initial encounter: Secondary | ICD-10-CM | POA: Insufficient documentation

## 2011-05-23 DIAGNOSIS — S0003XA Contusion of scalp, initial encounter: Secondary | ICD-10-CM | POA: Insufficient documentation

## 2011-05-23 DIAGNOSIS — K219 Gastro-esophageal reflux disease without esophagitis: Secondary | ICD-10-CM | POA: Insufficient documentation

## 2011-05-23 DIAGNOSIS — K589 Irritable bowel syndrome without diarrhea: Secondary | ICD-10-CM | POA: Insufficient documentation

## 2011-05-23 DIAGNOSIS — F341 Dysthymic disorder: Secondary | ICD-10-CM | POA: Insufficient documentation

## 2011-05-23 DIAGNOSIS — E119 Type 2 diabetes mellitus without complications: Secondary | ICD-10-CM | POA: Insufficient documentation

## 2011-05-23 DIAGNOSIS — I10 Essential (primary) hypertension: Secondary | ICD-10-CM | POA: Insufficient documentation

## 2011-05-23 DIAGNOSIS — I2510 Atherosclerotic heart disease of native coronary artery without angina pectoris: Secondary | ICD-10-CM | POA: Insufficient documentation

## 2011-05-23 DIAGNOSIS — S0990XA Unspecified injury of head, initial encounter: Secondary | ICD-10-CM | POA: Insufficient documentation

## 2011-05-23 DIAGNOSIS — G4733 Obstructive sleep apnea (adult) (pediatric): Secondary | ICD-10-CM | POA: Insufficient documentation

## 2011-05-23 DIAGNOSIS — M25559 Pain in unspecified hip: Secondary | ICD-10-CM | POA: Insufficient documentation

## 2011-05-23 DIAGNOSIS — E669 Obesity, unspecified: Secondary | ICD-10-CM | POA: Insufficient documentation

## 2011-05-23 DIAGNOSIS — Z79899 Other long term (current) drug therapy: Secondary | ICD-10-CM | POA: Insufficient documentation

## 2011-05-23 DIAGNOSIS — I739 Peripheral vascular disease, unspecified: Secondary | ICD-10-CM | POA: Insufficient documentation

## 2011-05-23 DIAGNOSIS — Z7982 Long term (current) use of aspirin: Secondary | ICD-10-CM | POA: Insufficient documentation

## 2011-05-23 DIAGNOSIS — W19XXXA Unspecified fall, initial encounter: Secondary | ICD-10-CM | POA: Insufficient documentation

## 2011-05-23 DIAGNOSIS — J449 Chronic obstructive pulmonary disease, unspecified: Secondary | ICD-10-CM | POA: Insufficient documentation

## 2011-05-23 DIAGNOSIS — Z794 Long term (current) use of insulin: Secondary | ICD-10-CM | POA: Insufficient documentation

## 2011-05-23 LAB — COMPREHENSIVE METABOLIC PANEL
ALT: 17 U/L (ref 0–35)
AST: 17 U/L (ref 0–37)
Albumin: 3.6 g/dL (ref 3.5–5.2)
Alkaline Phosphatase: 64 U/L (ref 39–117)
BUN: 18 mg/dL (ref 6–23)
CO2: 31 mEq/L (ref 19–32)
Calcium: 9.2 mg/dL (ref 8.4–10.5)
Chloride: 97 mEq/L (ref 96–112)
Creatinine, Ser: 0.97 mg/dL (ref 0.50–1.10)
GFR calc Af Amer: 71 mL/min — ABNORMAL LOW (ref >90)
GFR calc non Af Amer: 61 mL/min — ABNORMAL LOW (ref >90)
Glucose, Bld: 210 mg/dL — ABNORMAL HIGH (ref 70–99)
Potassium: 3.4 mEq/L — ABNORMAL LOW (ref 3.5–5.1)
Sodium: 136 mEq/L (ref 135–145)
Total Bilirubin: 0.2 mg/dL — ABNORMAL LOW (ref 0.3–1.2)
Total Protein: 6.6 g/dL (ref 6.0–8.3)

## 2011-05-23 LAB — DIFFERENTIAL
Basophils Absolute: 0 10*3/uL (ref 0.0–0.1)
Basophils Relative: 0 % (ref 0–1)
Eosinophils Absolute: 0.1 10*3/uL (ref 0.0–0.7)
Eosinophils Relative: 2 % (ref 0–5)
Lymphocytes Relative: 17 % (ref 12–46)
Lymphs Abs: 1.5 10*3/uL (ref 0.7–4.0)
Monocytes Absolute: 1 10*3/uL (ref 0.1–1.0)
Monocytes Relative: 12 % (ref 3–12)
Neutro Abs: 6.1 10*3/uL (ref 1.7–7.7)
Neutrophils Relative %: 70 % (ref 43–77)

## 2011-05-23 LAB — PROTIME-INR
INR: 1.58 — ABNORMAL HIGH (ref 0.00–1.49)
Prothrombin Time: 19.2 seconds — ABNORMAL HIGH (ref 11.6–15.2)

## 2011-05-23 LAB — CBC
HCT: 31.9 % — ABNORMAL LOW (ref 36.0–46.0)
Hemoglobin: 9.6 g/dL — ABNORMAL LOW (ref 12.0–15.0)
MCH: 25.7 pg — ABNORMAL LOW (ref 26.0–34.0)
MCHC: 30.1 g/dL (ref 30.0–36.0)
MCV: 85.3 fL (ref 78.0–100.0)
Platelets: 231 10*3/uL (ref 150–400)
RBC: 3.74 MIL/uL — ABNORMAL LOW (ref 3.87–5.11)
RDW: 21 % — ABNORMAL HIGH (ref 11.5–15.5)
WBC: 8.7 10*3/uL (ref 4.0–10.5)

## 2011-05-23 LAB — GLUCOSE, CAPILLARY: Glucose-Capillary: 243 mg/dL — ABNORMAL HIGH (ref 70–99)

## 2011-05-23 NOTE — ED Provider Notes (Signed)
History    CSN: [id number]  Arrival date & time 05/23/11  9767   First MD Initiated Contact with Patient 05/23/11 0251     Chief Complaint  Patient presents with  . Fall   HPI The patient presents to the emergency room after having a fall today. Patient was going to the bathroom when she fell striking the back of her head. Patient states she's not exactly sure why she fell. She does however have history of recurrent falls. Patient denies any loss of consciousness she is primarily having pain in the back of her head and her right hip. Patient denies any chest pain or shortness of breath. She denies any abdominal pain, nausea vomiting or diarrhea. She denies any numbness or weakness.  Past Medical History  Diagnosis Date  . Depression   . GERD (gastroesophageal reflux disease)     Followed by [name] // small bowel capsule endoscopy (11/2009) - minimal chronic inflammation, otherwise negative for  H.pylori, metaplasia, dysplasia  . Hypertension   . Hyperlipidemia   . Diabetes mellitus type II, uncontrolled     on insulin therapy  . COPD (chronic obstructive pulmonary disease) 2008    started home O2 (12/2010) // Prior significant smoker  // her CT of the chest there was no evidence of primary carcinoid tumor , scattered normal-sized mediastinal and hilar as well as axillary lymph nodes noted., no significant lymphadenopathy. COPD and emphysema with no acute cardiopulmonary disease noted , mild cardiomegaly and solitary calcified  plaque  . Moderate to severe pulmonary hypertension     severe per TEE (08/2010) - Peak RV-RA gradient 63m Hg  . Mitral regurgitation     mild to moderate per TEE (08/2010)  . Shingles   . Anemia     Anemia panel (08/2009) - iron 21, TIBC 441, UIBC 420,  ferritin 11 // Pt has been unable to tolerate iron supplementation due to chronic abdominal discomfort, source of which still is not clear.  .Marland KitchenHistory of colonic polyps     Colonoscopy (10/2005) - colon  polyp with path nonadenomatous, no malignancy - by Dr. [name] // Colonoscopy (02/2002) - 2 polyps noted, 1 - adenomatous, 2 - hyperplastic // Followed by Dr. [name] . Anxiety   . IBS (irritable bowel syndrome)   . Osteoporosis     DEXA (12/2004) - L-spine T score -2.6, Left hip -0.1  . Fibromyalgia   . Obstructive sleep apnea     on CPAP. // Sleep study (06/2009) - Moderate sleep apnea/ hypopnea syndrome , AHI 17.8 per hour with nonpositional hypopneas. CPAP titration to 12 CWP, AHI 2.4 per hour. Small resMed Quattro full-face mask with heated humidifier  . Restless leg syndrome   . Mitral stenosis     moderate per TEE (08/2010) // Mitral valve replacement with a 27-mm pericardial porcine valve (Medtronic Mosaic valve, serial ##[id number] on 09/20/2010 - PIvin Poot// [name] . Bilateral carotid artery stenosis     s/p right endarterectomy (06/2010). // Carotid [place](07/2010) -  60%-79% ICA stenosis, mid range of scale by velocity.  Left: Moderate-to-severe calcific and non-calcific plaque origin andproximal ICA and ECA.  60%-79% ICA stenosis, highest end of scale // Followed by [name]  . Peripheral vascular disease   . Coronary artery disease     Past Surgical History  Procedure Date  . Orif clavicle fracture 01/2004    by MThana Farr YLorin Mercy M.D for Right clavicle nonunion.  .Marland Kitchen  Tonsillectomy   . Carotid endarterectomy 07/04/2010    right, by [name] for asymptomatic right carotid artery stenosis  . Lipoma exision 08/2005    occipital lipoma 1.5cm - by Dr. [name]  . Hysteroscopy with endometrial ablation 06/2001    for persistent post-menopausal bleeding // by S. [name], M.D.  . Mitral valve replacement 09/20/10     with a 27-mm pericardial porcine valve (Medtronic Mosaic valve, serial #[id number]). 09/20/10, Dr Prescott Gum  . Left atrial maze procedure 09/20/10    for paroxysmal atrial fibrillation (Dr. Prescott Gum)  . Placement of left  chest tube. 09/24/2010    Dr Prescott Gum  . Pr vein bypass graft,aorto-fem-pop May 2012  . Cardiac valve replacement Aug. 2012  . Colonoscopy 05/12/2011    Procedure: COLONOSCOPY;  Surgeon: Lear Ng, MD;  Location: [facility] ENDOSCOPY;  Service: Endoscopy;  Laterality: N/A;  . Esophagogastroduodenoscopy 05/12/2011    Procedure: ESOPHAGOGASTRODUODENOSCOPY (EGD);  Surgeon: Lear Ng, MD;  Location: [facility] ENDOSCOPY;  Service: Endoscopy;  Laterality: N/A;    Family History  Problem Relation Age of Onset  . Heart disease Father 34    died to MI at 16yo  . Heart disease Brother 73    died of MI at 5yo    History  Substance Use Topics  . Smoking status: Former Smoker -- 1.0 packs/day for 50 years    Types: Cigarettes    Quit date: 07/12/2010  . Smokeless tobacco: Not on file  . Alcohol Use: No    OB History    Grav Para Term Preterm Abortions TAB SAB Ect Mult Living                  Review of Systems  All other systems reviewed and are negative.    Allergies  Ativan; Morphine; Percocet; and Tramadol hcl  Home Medications   Current Outpatient Rx  Name Route Sig Dispense Refill  . ALBUTEROL SULFATE HFA 108 (90 BASE) MCG/ACT IN AERS Inhalation Inhale 2 puffs into the lungs every 6 (six) hours as needed. For breathing    . ALPRAZOLAM 1 MG PO TABS Oral Take 1 tablet (1 mg total) by mouth 3 (three) times daily as needed for sleep or anxiety. 90 tablet 3  . AMIODARONE HCL 200 MG PO TABS Oral Take 1 tablet (200 mg total) by mouth daily. 30 tablet 12  . ASPIRIN 81 MG PO TABS Oral Take 81 mg by mouth daily.      . BUPROPION HCL ER (XL) 150 MG PO TB24 Oral Take 150 mg by mouth at bedtime.    Marland Kitchen DOCUSATE SODIUM 100 MG PO CAPS Oral Take 1 capsule (100 mg total) by mouth 2 (two) times daily. 60 capsule 7  . DULOXETINE HCL 30 MG PO CPEP  Take 3 tablets by mouth once daily 90 capsule 7  . FERROUS GLUCONATE 324 (38 FE) MG PO TABS Oral Take 1 tablet (324 mg total) by mouth 2 (two)  times daily with a meal. 120 tablet 3  . FLUTICASONE-SALMETEROL 250-50 MCG/DOSE IN AEPB Inhalation Inhale 1 puff into the lungs every 12 (twelve) hours. 1 each 11  . FUROSEMIDE 80 MG PO TABS Oral Take 1 tablet (80 mg total) by mouth 2 (two) times daily. 60 tablet 12  . GABAPENTIN 300 MG PO CAPS Oral Take 1 capsule (300 mg total) by mouth 2 (two) times daily. 60 capsule 5  . HYDROCODONE-ACETAMINOPHEN 5-325 MG PO TABS  1-2 tablets every 6 hours  as needed for pain. Do not take with alcohol, do no drive or operate heavy machinery if drowsiness occurs. Do not take more than instructed. 240 tablet 3    DO NOT FILL UNTIL 04/23/2011. Refill 1 - DO NOT F ...  . INSULIN ASPART 100 UNIT/ML [place] SOLN Subcutaneous Inject 5 Units into the skin 3 (three) times daily before meals.    . INSULIN DETEMIR 100 UNIT/ML [place] SOLN Subcutaneous Inject 45 Units into the skin at bedtime. DISPENSE 2 VIALS/ MONTH 20 mL 6  . LORATADINE 10 MG PO TABS Oral Take 1 tablet (10 mg total) by mouth daily. 90 tablet 3  . OMEPRAZOLE 40 MG PO CPDR Oral Take 40 mg by mouth 2 (two) times daily.     Marland Kitchen POTASSIUM CHLORIDE CRYS ER 20 MEQ PO TBCR Oral Take 1 tablet (20 mEq total) by mouth 3 (three) times daily. 90 tablet 12  . ROSUVASTATIN CALCIUM 20 MG PO TABS Oral Take 1 tablet (20 mg total) by mouth daily. 30 tablet 7  . TIOTROPIUM BROMIDE MONOHYDRATE 18 MCG IN CAPS Inhalation Place 18 mcg into inhaler and inhale daily.    . WARFARIN SODIUM 2 MG PO TABS Oral Take 2 mg by mouth daily.    Marland Kitchen ACCU-CHEK FASTCLIX LANCETS MISC Does not apply 1 each by Does not apply route 3 (three) times daily before meals. Dx code- 250.00 102 each 12  . GLUCOSE BLOOD VI STRP  Use to test blood sugar 3 times daily. 100 each 11    One Touch Ultra test strips  . INSULIN PEN NEEDLE 31G X 5 MM MISC  Please check blood sugars 4 times daily. Before breakfast and all meals. 100 each 11  . INSULIN SYRINGE 31G X 5/16" 0.3 ML MISC Does not apply 1 Syringe by Does not apply route  2 (two) times daily at 10 AM and 5 PM. 100 each 11  . INSULIN SYRINGE 30G X 1/2" 1 ML MISC Does not apply 1 Container by Does not apply route 2 (two) times daily. 100 each 11  . ACCU-CHEK FASTCLIX LANCET KIT Does not apply 1 each by Does not apply route 3 (three) times daily. 1 kit 0    BP 135/72  Pulse 78  Temp(Src) 98.7 F (37.1 C) (Oral)  Resp 20  SpO2 95%  Physical Exam  Nursing note and vitals reviewed. Constitutional: She appears well-nourished. No distress.       Obese, immobilized on a backboard  HENT:  Head: Normocephalic and atraumatic.  Right Ear: External ear normal.  Left Ear: External ear normal.        Posterior cephalhematoma  Eyes: Conjunctivae are normal. Right eye exhibits no discharge. Left eye exhibits no discharge. No scleral icterus.  Neck: Neck supple. No tracheal deviation present.  Cardiovascular: Normal rate, regular rhythm and intact distal pulses.   Pulmonary/Chest: Effort normal and breath sounds normal. No stridor. No respiratory distress. She has no wheezes. She has no rales.  Abdominal: Soft. Bowel sounds are normal. She exhibits no distension. There is no tenderness. There is no rebound and no guarding.  Musculoskeletal: She exhibits no edema and no tenderness.       Right hip: She exhibits tenderness and bony tenderness. She exhibits normal range of motion, no swelling and no deformity.       Cervical back: She exhibits no tenderness and no bony tenderness.       Thoracic back: She exhibits no tenderness and no bony tenderness.  Lumbar back: She exhibits no tenderness and no bony tenderness.       No shortening of the extremity, tenderness palpation of the right lateral aspect, minimal pain with range of motion  Neurological: She is alert. She has normal strength. No sensory deficit. Cranial nerve deficit:  no gross defecits noted. She exhibits normal muscle tone. She displays no seizure activity. Coordination normal.  Skin: Skin is warm and  dry. No rash noted.  Psychiatric: She has a normal mood and affect.    ED Course  Procedures (including critical care time)  Date: 05/23/2011  Rate: 77  Rhythm: normal sinus rhythm  QRS Axis: normal  Intervals: normal  ST/T Wave abnormalities: normal  Conduction Disutrbances:first-degree A-V block   Narrative Interpretation:   Old EKG Reviewed: none available   Labs Reviewed  CBC - Abnormal; Notable for the following:    RBC 3.74 (*)    Hemoglobin 9.6 (*)    HCT 31.9 (*)    MCH 25.7 (*)    RDW 21.0 (*)    All other components within normal limits  COMPREHENSIVE METABOLIC PANEL - Abnormal; Notable for the following:    Potassium 3.4 (*)    Glucose, Bld 210 (*)    Total Bilirubin 0.2 (*)    GFR calc non Af Amer 61 (*)    GFR calc Af Amer 71 (*)    All other components within normal limits  PROTIME-INR - Abnormal; Notable for the following:    Prothrombin Time 19.2 (*)    INR 1.58 (*)    All other components within normal limits  GLUCOSE, CAPILLARY - Abnormal; Notable for the following:    Glucose-Capillary 243 (*)    All other components within normal limits  DIFFERENTIAL  POCT CBG (FASTING - GLUCOSE)-MANUAL ENTRY   Dg Chest 2 View  05/23/2011  *RADIOLOGY REPORT*  Clinical Data: Status post fall; concern for chest injury.  CHEST - 2 VIEW  Comparison: Chest radiograph performed 05/07/2011  Findings: There is stable elevation of the left hemidiaphragm. Left basilar opacity likely reflects persistent atelectasis.  Mild vascular congestion is noted.  No definite pleural effusion or pneumothorax is seen.  The heart is borderline normal in size; the patient is status post median sternotomy.  Hardware is again noted along the right clavicle.  No acute osseous abnormalities are seen.  IMPRESSION: Left basilar airspace opacity likely reflects persistent atelectasis; mild vascular congestion noted.  Stable elevation of the left hemidiaphragm.  No displaced rib fractures seen.  Original  Report Authenticated By: Santa Lighter, M.D.   Dg Hip Complete Right  05/23/2011  *RADIOLOGY REPORT*  Clinical Data: Status post fall; right hip pain and pelvic pain.  RIGHT HIP - COMPLETE 2+ VIEW  Comparison: None.  Findings: There is no evidence of fracture or dislocation.  Both femoral heads are seated normally within their respective acetabula.  The proximal right femur appears intact.  No significant degenerative change is appreciated.  The sacroiliac joints are unremarkable in appearance.  The visualized bowel gas pattern is grossly unremarkable in appearance.  IMPRESSION: No evidence of fracture or dislocation.  Original Report Authenticated By: Santa Lighter, M.D.   Ct Head Wo Contrast  05/23/2011  *RADIOLOGY REPORT*  Clinical Data:  Status post fall from standing position; head and neck pain.  CT HEAD WITHOUT CONTRAST AND CT CERVICAL SPINE WITHOUT CONTRAST  Technique:  Multidetector CT imaging of the head and cervical spine was performed following the standard protocol without intravenous contrast.  Multiplanar CT image reconstructions of the cervical spine were also generated.  Comparison: CT of the head performed 09/18/2010, and MRI of the brain / MRA of the head and neck performed 08/03/2010  CT HEAD  Findings: There is no evidence of acute infarction, mass lesion, or intra- or extra-axial hemorrhage on CT.  Evaluation is mildly suboptimal due to motion artifact.  The posterior fossa, including the cerebellum, brainstem and fourth ventricle, is within normal limits.  The third and lateral ventricles, and basal ganglia are unremarkable in appearance.  The cerebral hemispheres are symmetric in appearance, with normal gray- white differentiation.  No mass effect or midline shift is seen.  There is no evidence of fracture; visualized osseous structures are unremarkable in appearance.  The orbits are within normal limits. The paranasal sinuses and mastoid air cells are well-aerated.  Mild soft tissue  swelling is noted at the left posterior vertex.  IMPRESSION:  1.  No evidence of traumatic intracranial injury or fracture. 2.  Mild soft tissue swelling noted at the left posterior vertex.  CT CERVICAL SPINE  Findings: There is no evidence of fracture or subluxation. Vertebral bodies demonstrate normal height and alignment. Intervertebral disc spaces are preserved.  Prevertebral soft tissues are within normal limits.  The visualized neural foramina are grossly unremarkable.  The thyroid gland is unremarkable in appearance.  The visualized lung apices are clear.  No significant soft tissue abnormalities are seen.  IMPRESSION: No evidence of fracture or subluxation along the cervical spine.  Original Report Authenticated By: Santa Lighter, M.D.   Ct Cervical Spine Wo Contrast  05/23/2011  *RADIOLOGY REPORT*  Clinical Data:  Status post fall from standing position; head and neck pain.  CT HEAD WITHOUT CONTRAST AND CT CERVICAL SPINE WITHOUT CONTRAST  Technique:  Multidetector CT imaging of the head and cervical spine was performed following the standard protocol without intravenous contrast.  Multiplanar CT image reconstructions of the cervical spine were also generated.  Comparison: CT of the head performed 09/18/2010, and MRI of the brain / MRA of the head and neck performed 08/03/2010  CT HEAD  Findings: There is no evidence of acute infarction, mass lesion, or intra- or extra-axial hemorrhage on CT.  Evaluation is mildly suboptimal due to motion artifact.  The posterior fossa, including the cerebellum, brainstem and fourth ventricle, is within normal limits.  The third and lateral ventricles, and basal ganglia are unremarkable in appearance.  The cerebral hemispheres are symmetric in appearance, with normal gray- white differentiation.  No mass effect or midline shift is seen.  There is no evidence of fracture; visualized osseous structures are unremarkable in appearance.  The orbits are within normal limits.  The paranasal sinuses and mastoid air cells are well-aerated.  Mild soft tissue swelling is noted at the left posterior vertex.  IMPRESSION:  1.  No evidence of traumatic intracranial injury or fracture. 2.  Mild soft tissue swelling noted at the left posterior vertex.  CT CERVICAL SPINE  Findings: There is no evidence of fracture or subluxation. Vertebral bodies demonstrate normal height and alignment. Intervertebral disc spaces are preserved.  Prevertebral soft tissues are within normal limits.  The visualized neural foramina are grossly unremarkable.  The thyroid gland is unremarkable in appearance.  The visualized lung apices are clear.  No significant soft tissue abnormalities are seen.  IMPRESSION: No evidence of fracture or subluxation along the cervical spine.  Original Report Authenticated By: Santa Lighter, M.D.      MDM  The patient  does not appear to have any serious injuries associated with her fall. I suspect this was a mechanical fall and doubt syncopal event. The patient was assisted to the restroom and is able to bear weight. At this time I feel like she can be safely discharged back to her nursing facility.        Kathalene Frames, MD 05/23/11 [phone]

## 2011-05-23 NOTE — ED Notes (Signed)
Per [employer], Pt is from [facility]. Pt fell from a standing position when going to the bathroom. Fell onto ceramic towel floor. Denies LOC.  C/O head, neck, hip pain. No back pain. Upper cervical spin pain with palpation. Hematoma left/center back of head.  Bilateral hip pain. No shortening or rotation. Pt is on blood thinners. Completely alert and oriented. Vitals: 924/46,28,63, 100% 2L O2 N/C (normally on O2). No medication. 20g LAC. NS KVO

## 2011-05-23 NOTE — Discharge Instructions (Signed)
Contusion A contusion is a deep bruise. Contusions are the result of an injury that caused bleeding under the skin. The contusion may turn blue, purple, or yellow. Minor injuries will give you a painless contusion, but more severe contusions may stay painful and swollen for a few weeks.  CAUSES  A contusion is usually caused by a blow, trauma, or direct force to an area of the body. SYMPTOMS   Swelling and redness of the injured area.   Bruising of the injured area.   Tenderness and soreness of the injured area.   Pain.  DIAGNOSIS  The diagnosis can be made by taking a history and physical exam. An X-ray, CT scan, or MRI may be needed to determine if there were any associated injuries, such as fractures. TREATMENT  Specific treatment will depend on what area of the body was injured. In general, the best treatment for a contusion is resting, icing, elevating, and applying cold compresses to the injured area. Over-the-counter medicines may also be recommended for pain control. Ask your caregiver what the best treatment is for your contusion. HOME CARE INSTRUCTIONS   Put ice on the injured area.   Put ice in a plastic bag.   Place a towel between your skin and the bag.   Leave the ice on for 15 to 20 minutes, 3 to 4 times a day.   Only take over-the-counter or prescription medicines for pain, discomfort, or fever as directed by your caregiver. Your caregiver may recommend avoiding anti-inflammatory medicines (aspirin, ibuprofen, and naproxen) for 48 hours because these medicines may increase bruising.   Rest the injured area.   If possible, elevate the injured area to reduce swelling.  SEEK IMMEDIATE MEDICAL CARE IF:   You have increased bruising or swelling.   You have pain that is getting worse.   Your swelling or pain is not relieved with medicines.  MAKE SURE YOU:   Understand these instructions.   Will watch your condition.   Will get help right away if you are not  doing well or get worse.  Document Released: 11/06/2004 Document Revised: 01/16/2011 Document Reviewed: 12/02/2010 [facility] Patient Information 2012 [place], [place].

## 2011-05-23 NOTE — ED Notes (Signed)
Pt is cervical spine collar and on spine board. Will continue to monitor.

## 2011-05-23 NOTE — ED Notes (Signed)
Patient was able to ambulate to the bathroom with assistance. [facility] called to transport patient back to [facility]

## 2011-05-23 NOTE — ED Notes (Signed)
Patient presently resting on stretcher, c/o left hip pain and headache, states she is basically sore all over. Attempting to use the bedpan.

## 2011-05-26 ENCOUNTER — Ambulatory Visit (HOSPITAL_COMMUNITY): Payer: Medicare Other

## 2011-05-28 ENCOUNTER — Ambulatory Visit (HOSPITAL_COMMUNITY): Payer: Medicare Other

## 2011-05-29 ENCOUNTER — Ambulatory Visit (INDEPENDENT_AMBULATORY_CARE_PROVIDER_SITE_OTHER): Payer: Medicare Other | Admitting: Internal Medicine

## 2011-05-29 ENCOUNTER — Other Ambulatory Visit: Payer: Self-pay | Admitting: *Deleted

## 2011-05-29 ENCOUNTER — Encounter: Payer: Self-pay | Admitting: Internal Medicine

## 2011-05-29 VITALS — BP 138/62 | HR 83 | Temp 97.9°F | Ht 62.0 in | Wt 188.5 lb

## 2011-05-29 DIAGNOSIS — J4489 Other specified chronic obstructive pulmonary disease: Secondary | ICD-10-CM

## 2011-05-29 DIAGNOSIS — J449 Chronic obstructive pulmonary disease, unspecified: Secondary | ICD-10-CM

## 2011-05-29 DIAGNOSIS — R600 Localized edema: Secondary | ICD-10-CM

## 2011-05-29 DIAGNOSIS — E119 Type 2 diabetes mellitus without complications: Secondary | ICD-10-CM

## 2011-05-29 DIAGNOSIS — K5900 Constipation, unspecified: Secondary | ICD-10-CM

## 2011-05-29 DIAGNOSIS — K219 Gastro-esophageal reflux disease without esophagitis: Secondary | ICD-10-CM

## 2011-05-29 DIAGNOSIS — D649 Anemia, unspecified: Secondary | ICD-10-CM

## 2011-05-29 DIAGNOSIS — R252 Cramp and spasm: Secondary | ICD-10-CM

## 2011-05-29 DIAGNOSIS — I10 Essential (primary) hypertension: Secondary | ICD-10-CM

## 2011-05-29 DIAGNOSIS — R609 Edema, unspecified: Secondary | ICD-10-CM

## 2011-05-29 DIAGNOSIS — Z9181 History of falling: Secondary | ICD-10-CM

## 2011-05-29 DIAGNOSIS — IMO0001 Reserved for inherently not codable concepts without codable children: Secondary | ICD-10-CM

## 2011-05-29 DIAGNOSIS — F419 Anxiety disorder, unspecified: Secondary | ICD-10-CM

## 2011-05-29 DIAGNOSIS — IMO0002 Reserved for concepts with insufficient information to code with codable children: Secondary | ICD-10-CM

## 2011-05-29 DIAGNOSIS — K5909 Other constipation: Secondary | ICD-10-CM

## 2011-05-29 DIAGNOSIS — E1165 Type 2 diabetes mellitus with hyperglycemia: Secondary | ICD-10-CM

## 2011-05-29 DIAGNOSIS — R296 Repeated falls: Secondary | ICD-10-CM

## 2011-05-29 LAB — GLUCOSE, CAPILLARY: Glucose-Capillary: 119 mg/dL — ABNORMAL HIGH (ref 70–99)

## 2011-05-29 MED ORDER — SENNA-DOCUSATE SODIUM 8.6-50 MG PO TABS: 2.0000 | ORAL_TABLET | Freq: Every day | ORAL | Status: DC

## 2011-05-29 MED ORDER — BUPROPION HCL ER (XL) 150 MG PO TB24: 150.0000 mg | ORAL_TABLET | Freq: Every day | ORAL | Status: DC

## 2011-05-29 MED ORDER — ALPRAZOLAM 1 MG PO TABS: 1.0000 mg | ORAL_TABLET | Freq: Three times a day (TID) | ORAL | Status: DC | PRN

## 2011-05-29 MED ORDER — TRUFORM STOCKINGS 20-30MMHG MISC: Status: DC

## 2011-05-29 NOTE — Progress Notes (Signed)
Subjective:    Patient ID: [name] female   DOB: 10-07-1950 61 y.o.   MRN: [id number]  HPI: [name] is a 61 y.o. with a PMHx of O2-dependent COPD, DMII, HTN, mitral stenosis with recent porcine valve replacement, who presented to clinic today for the following:  1) Hospital follow-up - Patient was hospitalized at [facility] from March 27 - May 13, 2011 for evaluation of the following:   Increased falls at home - was determined to be likely multifactorial, contributed by moderate to severe anemia, chronic gait instability, and possible narcotic effect. PT consult was provided inpatient for gait and balance and was discharge to SNF. Since hospital discharge, had recurrent fall at the SNF while going to the restroom on 04/12. She went to the ER for this, and had right hip xray completed that was negative for acute fracture or dislocation. In addition, had CT head and cervical spine w/o contrast 2/2 complaint of fall with hitting back of head -  CT head showing no bleed or intracranial fracture, mild soft tissue swelling at left posterior vertex. CT C spine was negative for evidence fo fracture or subluxation.   Discharged from nursing home 2 days ago. States that after her fall, she is having pain all over, mostly left buttocks. Is taking her prescribed norco for the pain with adequate relief. Has not tried heating/ icing or other medications. Is not having radiation of pain, no weakness or paresthesias in extremities.   Anemia - patient has prolonged and known history of anemia, and is otherwise unable to tolerate patient was found to have admission Hgb of 7.1, given severity of anemia, she was transfused 2 units pRBC. As well, GI was consulted and EGD/ colonoscopy was performed showing single colonic polyp, tubular adenoma per path report.   2) Ankle tightness -  Has noted increased BL symmetrical LE swelling since her heart surgery, worse recently. No associated foot or ankle lesions,  trauma. Is on coumadin for her atrial fibrillation and with INR of 3.1 at time of discharge from NH 2 days ago.   3) Ear pain, left - having left ear pain x 5-6 days, was treated with amoxicillin at nursing. Also with nasal congestion. Wants order for humidified oxygen for her chronic O2.  4) Chronic constipation - continues to have difficulty with constipation. Colace seems not to be effective, although Senna-S was more effective in nursing home.    Review of Systems: Per HPI.   Current Outpatient Medications: Medication Sig  . ACCU-CHEK FASTCLIX LANCETS MISC 1 each by Does not apply route 3 (three) times daily before meals. Dx code- 250.00  . albuterol (PROVENTIL HFA;VENTOLIN HFA) 108 (90 BASE) MCG/ACT inhaler Inhale 2 puffs into the lungs every 6 (six) hours as needed. For breathing  . ALPRAZolam (XANAX) 1 MG tablet Take 1 tablet (1 mg total) by mouth 3 (three) times daily as needed for sleep or anxiety.  Marland Kitchen amiodarone (PACERONE) 200 MG tablet Take 1 tablet (200 mg total) by mouth daily.  Marland Kitchen aspirin 81 MG tablet Take 81 mg by mouth daily.    Marland Kitchen buPROPion (WELLBUTRIN XL) 150 MG 24 hr tablet Take 150 mg by mouth at bedtime.  . docusate sodium (COLACE) 100 MG capsule Take 1 capsule (100 mg total) by mouth 2 (two) times daily.  . DULoxetine (CYMBALTA) 30 MG capsule Take 3 tablets by mouth once daily  . ferrous gluconate (FERGON) 324 MG tablet Take 1 tablet (324 mg total) by mouth  2 (two) times daily with a meal.  . Fluticasone-Salmeterol (ADVAIR DISKUS) 250-50 MCG/DOSE AEPB Inhale 1 puff into the lungs every 12 (twelve) hours.  . furosemide (LASIX) 80 MG tablet Take 1 tablet (80 mg total) by mouth 2 (two) times daily.  Marland Kitchen gabapentin (NEURONTIN) 300 MG capsule Take 1 capsule (300 mg total) by mouth 2 (two) times daily.  Marland Kitchen glucose blood test strip Use to test blood sugar 3 times daily.  Marland Kitchen HYDROcodone-acetaminophen (NORCO) 5-325 MG per tablet 1-2 tablets every 6 hours as needed for pain. Do not  take with alcohol, do no drive or operate heavy machinery if drowsiness occurs. Do not take more than instructed.  . Insulin Syringe-Needle U-100 (INSULIN SYRINGE 1CC/30GX1/2") 30G X 1/2" 1 ML MISC 1 Container by Does not apply route 2 (two) times daily.  . Lancets Misc. (ACCU-CHEK FASTCLIX LANCET) KIT 1 each by Does not apply route 3 (three) times daily.  Marland Kitchen loratadine (CLARITIN) 10 MG tablet Take 1 tablet (10 mg total) by mouth daily.  Marland Kitchen omeprazole (PRILOSEC) 40 MG capsule Take 40 mg by mouth 2 (two) times daily.   . potassium chloride SA (K-DUR,KLOR-CON) 20 MEQ tablet Take 1 tablet (20 mEq total) by mouth 3 (three) times daily.  . rosuvastatin (CRESTOR) 20 MG tablet Take 1 tablet (20 mg total) by mouth daily.  Marland Kitchen tiotropium (SPIRIVA) 18 MCG inhalation capsule Place 18 mcg into inhaler and inhale daily.  Marland Kitchen warfarin (COUMADIN) 2 MG tablet Take 2 mg by mouth daily.  . insulin aspart (NOVOLOG) 100 UNIT/ML injection Inject 5 Units into the skin 3 (three) times daily before meals.  . insulin detemir (LEVEMIR) 100 UNIT/ML injection Inject 50 Units into the skin at bedtime. DISPENSE 2 VIALS/ MONTH  . Insulin Pen Needle 31G X 5 MM MISC Please check blood sugars 4 times daily. Before breakfast and all meals.  . Insulin Syringe-Needle U-100 (INSULIN SYRINGE .3CC/31GX5/16") 31G X 5/16" 0.3 ML MISC 1 Syringe by Does not apply route 2 (two) times daily at 10 AM and 5 PM.  . DISCONTD: insulin detemir (LEVEMIR) 100 UNIT/ML injection Inject 45 Units into the skin at bedtime. DISPENSE 2 VIALS/ MONTH    Allergies  Allergen Reactions  . Ativan     Patient's sister noted that ativan caused the patient to become extremely confused during hospitalization 09/2010  . Morphine     REACTION: Unknown reaction  . Percocet (Oxycodone-Acetaminophen)     headache  . Tramadol Hcl     REACTION: swelling    Past Medical History  Diagnosis Date  . Depression   . GERD (gastroesophageal reflux disease)     Followed by Dr.  Amedeo Plenty // small bowel capsule endoscopy (11/2009) - minimal chronic inflammation, otherwise negative for  H.pylori, metaplasia, dysplasia  . Hypertension   . Hyperlipidemia   . Diabetes mellitus type II, uncontrolled     on insulin therapy  . COPD (chronic obstructive pulmonary disease) 2008    started home O2 (12/2010) // Prior significant smoker  // her CT of the chest there was no evidence of primary carcinoid tumor , scattered normal-sized mediastinal and hilar as well as axillary lymph nodes noted., no significant lymphadenopathy. COPD and emphysema with no acute cardiopulmonary disease noted , mild cardiomegaly and solitary calcified  plaque  . Moderate to severe pulmonary hypertension     severe per TEE (08/2010) - Peak RV-RA gradient 14m Hg  . Mitral regurgitation     mild to moderate per TEE (08/2010)  .  Shingles   . Anemia     Anemia panel (08/2009) - iron 21, TIBC 441, UIBC 420,  ferritin 11 // Pt has been unable to tolerate iron supplementation due to chronic abdominal discomfort, source of which still is not clear.  Marland Kitchen History of colonic polyps     Colonoscopy (10/2005) - colon polyp with path nonadenomatous, no malignancy - by [name]. // Colonoscopy (02/2002) - 2 polyps noted, 1 - adenomatous, 2 - hyperplastic // Followed by [name]  . Anxiety   . IBS (irritable bowel syndrome)   . Osteoporosis     DEXA (12/2004) - L-spine T score -2.6, Left hip -0.1  . Fibromyalgia   . Obstructive sleep apnea     on CPAP. // Sleep study (06/2009) - Moderate sleep apnea/ hypopnea syndrome , AHI 17.8 per hour with nonpositional hypopneas. CPAP titration to 12 CWP, AHI 2.4 per hour. Small resMed Quattro full-face mask with heated humidifier  . Restless leg syndrome   . Mitral stenosis     moderate per TEE (08/2010) // Mitral valve replacement with a 27-mm pericardial porcine valve (Medtronic Mosaic valve, serial #[id number]) on 09/20/2010 - [name] // Followed at [facility],  Dr. Stanford Breed  . Bilateral carotid artery stenosis     s/p right endarterectomy (06/2010). // Carotid US (07/2010) -  60%-79% ICA stenosis, mid range of scale by velocity.  Left: Moderate-to-severe calcific and non-calcific plaque origin andproximal ICA and ECA.  60%-79% ICA stenosis, highest end of scale // Followed by [name].  . Peripheral vascular disease   . Coronary artery disease     Objective:    Physical Exam: Filed Vitals:   05/29/11 1327  BP: 138/62  Pulse: 83  Temp: 97.9 F (36.6 C)     General: Vital signs reviewed and noted. Well-developed, well-nourished, in no acute distress; alert, appropriate and cooperative throughout examination.  Head: Normocephalic, atraumatic.  Ears: TM nonerythematous, not bulging, good light reflex bilaterally.  Nose: Mucous membranes moist, not inflammed, nonerythematous.  Throat: Oropharynx nonerythematous, no exudate appreciated.   Neck: No deformities, masses, or tenderness noted.  Lungs:  Normal respiratory effort. Clear to auscultation BL without crackles or wheezes.  Heart: RRR. S1 and S2 normal without gallop, rubs. (+) murmur.  Abdomen:  BS normoactive. Soft, Nondistended, non-tender.  No masses or organomegaly.  Extremities: 1+ bilateral pretibial edema.    Assessment/ Plan:   Case and plan of care discussed with Dr. Milta Deiters.

## 2011-05-29 NOTE — Telephone Encounter (Signed)
This is the dose that she was discharged home on. If this is below what she was instructed at the nursing home, please let me know. We can then, just adjust it on our Epic system. We did not discuss her diabetes at all today, as we had multiple, multiple things to address. She will need to have this addressed at her next visit in 2 weeks.   I did not make any changes to her diabetic regimen today.  [name], D.O.

## 2011-05-29 NOTE — Patient Instructions (Signed)
Please follow-up at the clinic in 2 weeks, at which time we will reevaluate your diabetes, blood pressure, and ankle swelling - OR, please follow-up in the clinic sooner if needed.  There have not been changes in your medications:  Please keep your legs elevated above your heart as much as possible.  I will send in an RX for you for compression stockings.  Please use heating pad (with caution) for your buttocks pain - make sure not to burn yourself. We will consider PT referral next visit if pain persistent.  Please decrease your sodium (as written below)   If you have been started on new medication(s), and you develop symptoms concerning for allergic reaction, including, but not limited to, throat closing, tongue swelling, rash, please stop the medication immediately and call the clinic at [phone], and go to the ER.  If you are diabetic, please bring your meter to your next visit.  If symptoms worsen, or new symptoms arise, please call the clinic or go to the ER.  Please bring all of your medications in a bag to your next visit.   2 Gram Low Sodium Diet A 2 gram sodium diet restricts the amount of sodium in the diet to no more than 2 g or 2000 mg daily. Limiting the amount of sodium is often used to help lower blood pressure. It is important if you have heart, liver, or kidney problems. Many foods contain sodium for flavor and sometimes as a preservative. When the amount of sodium in a diet needs to be low, it is important to know what to look for when choosing foods and drinks. The following includes some information and guidelines to help make it easier for you to adapt to a low sodium diet. QUICK TIPS  Do not add salt to food.   Avoid convenience items and fast food.   Choose unsalted snack foods.   Buy lower sodium products, often labeled as "lower sodium" or "no salt added."   Check food labels to learn how much sodium is in 1 serving.   When eating at a restaurant, ask  that your food be prepared with less salt or none, if possible.  READING FOOD LABELS FOR SODIUM INFORMATION The nutrition facts label is a good place to find how much sodium is in foods. Look for products with no more than 500 to 600 mg of sodium per meal and no more than 150 mg per serving. Remember that 2 g = 2000 mg. The food label may also list foods as:  Sodium-free: Less than 5 mg in a serving.   Very low sodium: 35 mg or less in a serving.   Low-sodium: 140 mg or less in a serving.   Light in sodium: 50% less sodium in a serving. For example, if a food that usually has 300 mg of sodium is changed to become light in sodium, it will have 150 mg of sodium.   Reduced sodium: 25% less sodium in a serving. For example, if a food that usually has 400 mg of sodium is changed to reduced sodium, it will have 300 mg of sodium.  CHOOSING FOODS Grains  Avoid: Salted crackers and snack items. Some cereals, including instant hot cereals. Bread stuffing and biscuit mixes. Seasoned rice or pasta mixes.   Choose: Unsalted snack items. Low-sodium cereals, oats, puffed wheat and rice, shredded wheat. English muffins and bread. Pasta.  Meats  Avoid: Salted, canned, smoked, spiced, pickled meats, including fish and poultry. Berniece Salines,  ham, sausage, cold cuts, hot dogs, anchovies.   Choose: Low-sodium canned tuna and salmon. Fresh or frozen meat, poultry, and fish.  Dairy  Avoid: Processed cheese and spreads. Cottage cheese. Buttermilk and condensed milk. Regular cheese.   Choose: Milk. Low-sodium cottage cheese. Yogurt. Sour cream. Low-sodium cheese.  Fruits and Vegetables  Avoid: Regular canned vegetables. Regular canned tomato sauce and paste. Frozen vegetables in sauces. Olives. Angie Fava. Relishes. Sauerkraut.   Choose: Low-sodium canned vegetables. Low-sodium tomato sauce and paste. Frozen or fresh vegetables. Fresh and frozen fruit.  Condiments  Avoid: Canned and packaged gravies.  Worcestershire sauce. Tartar sauce. Barbecue sauce. Soy sauce. Steak sauce. Ketchup. Onion, garlic, and table salt. Meat flavorings and tenderizers.   Choose: Fresh and dried herbs and spices. Low-sodium varieties of mustard and ketchup. Lemon juice. Tabasco sauce. Horseradish.  SAMPLE 2 GRAM SODIUM MEAL PLAN Breakfast / Sodium (mg)  1 cup low-fat milk / 539 mg   2 slices whole-wheat toast / 270 mg   1 tbs heart-healthy margarine / 153 mg   1 hard-boiled egg / 139 mg   1 small orange / 0 mg  Lunch / Sodium (mg)  1 cup raw carrots / 76 mg    cup hummus / 298 mg   1 cup low-fat milk / 143 mg    cup red grapes / 2 mg   1 whole-wheat pita bread / 356 mg  Dinner / Sodium (mg)  1 cup whole-wheat pasta / 2 mg   1 cup low-sodium tomato sauce / 73 mg   3 oz lean ground beef / 57 mg   1 small side salad (1 cup raw spinach leaves,  cup cucumber,  cup yellow bell pepper) with 1 tsp olive oil and 1 tsp red wine vinegar / 25 mg  Snack / Sodium (mg)  1 container low-fat vanilla yogurt / 107 mg   3 graham cracker squares / 127 mg  Nutrient Analysis  Calories: 2033   Protein: 77 g   Carbohydrate: 282 g   Fat: 72 g   Sodium: 1971 mg  Document Released: 01/27/2005 Document Revised: 01/16/2011 Document Reviewed: 04/30/2009 [facility] Patient Information 2012 [place], [place].

## 2011-05-29 NOTE — Telephone Encounter (Signed)
She was just here and did not get to discuss lowered dose of Novolog with doctor. She's curious to know why her dose was changed.  CBGs yesterday: 212 and other 2 <200, Levemir increased last 2 days at nursing home with no explanation. Told patient I would discuss with Dr. Doy Mince and get back to her.

## 2011-05-30 ENCOUNTER — Ambulatory Visit (HOSPITAL_COMMUNITY): Payer: Medicare Other

## 2011-05-30 ENCOUNTER — Telehealth: Payer: Self-pay | Admitting: *Deleted

## 2011-05-30 ENCOUNTER — Encounter: Payer: Self-pay | Admitting: Internal Medicine

## 2011-05-30 LAB — MICROALBUMIN / CREATININE URINE RATIO
Creatinine, Urine: 43.9 mg/dL
Microalb Creat Ratio: 13.4 mg/g (ref 0.0–30.0)
Microalb, Ur: 0.59 mg/dL (ref 0.00–1.89)

## 2011-05-30 NOTE — Telephone Encounter (Signed)
See note. 

## 2011-05-30 NOTE — Telephone Encounter (Signed)
Xanax rx called to [facility]. 

## 2011-05-30 NOTE — Telephone Encounter (Signed)
Message copied by Ebbie Latus on Fri May 30, 2011  9:57 AM ------      Message from: Annamarie Dawley      Created: Thu May 29, 2011  5:06 PM       Called her and clarified changes made in the hospital. You do not need to call too.            [name], D.O.

## 2011-05-31 DIAGNOSIS — R600 Localized edema: Secondary | ICD-10-CM | POA: Insufficient documentation

## 2011-05-31 NOTE — Assessment & Plan Note (Signed)
Assessment:  Patient's bilateral, symmetrical pretibial edema is likely multifactorial secondary to dependent edema due to decreased mobility, as well as her known grade 2 diastolic dysfunction and severe pulmonary hypertension.   DVT very unlikely given recent therapeutic coumadin levels, and symmetrical nature of the LE swelling.  She does have frequent falls, and BP is within normal limits. Therefore, don't want to be overly aggressive with her diuretics.  Plan:      Add compression stockings.  Info given regarding 2 gram salt diet.  Will also check urine microalb/ cr

## 2011-05-31 NOTE — Assessment & Plan Note (Signed)
Assessment: Status: Already on twice daily colace, is ineffective. Senna-S worked better in the nursing home.  Disease Control: not controlled  Progress toward goals: unchanged  Barriers to meeting goals: no barriers identified   Plan:      DC colace.  Start Senna-S.

## 2011-05-31 NOTE — Assessment & Plan Note (Signed)
Assessment:  Luckily, has not experienced additional falls at home. Has just been discharged from her SNF, and with plan to start cardiopulmonary rehab soon.  Plan:      Encouraged to continue using walker, limiting attempts to walk independently  Encouraged to complete her cardiopulm rehab as is the plan  Will consider additional home PT if cardioppulmonary rehab insufficient

## 2011-05-31 NOTE — Assessment & Plan Note (Signed)
Pertinent Labs: Anemia Panel  Ref. Range 05/11/2011 07:07  Iron Latest Range: 42-135 ug/dL 40 (L)  UIBC Latest Range: 125-400 ug/dL 409 (H)  TIBC Latest Range: 250-470 ug/dL 449  Saturation Ratios Latest Range: 20-55 % 9 (L)  Ferritin Latest Range: 10-291 ng/mL 19  RBC Folate Latest Range: >=366 ng/mL 1067 (H)  Vitamin B-12 Latest Range: 211-911 pg/mL 488   Reticulocytes:  Ref. Range 05/11/2011 07:07  RBC. Latest Range: 3.87-5.11 MIL/uL 3.90  Retic Ct Pct Latest Range: 0.4-3.1 % 3.6 (H)  Retic Count, Manual Latest Range: 19.0-186.0 K/uL 140.4   Assessment:  BL Hgb between 8-9.  Patient has history of chronic anemia (several years), which to this point, has been of undetermined cause. During her recent admission in 04/2011, had a presenting Hgb of 7.1, was subsequently transfused 2 units, and EGD/ colonoscopy were performed. Colonoscopy showed only singular tubular adenoma. Hgb was stable at her baseline between 9-10 by time of discharge.   Asymptomatic today.   Plan:      Will plan for hematology referral for continued work-up.

## 2011-06-02 ENCOUNTER — Ambulatory Visit (INDEPENDENT_AMBULATORY_CARE_PROVIDER_SITE_OTHER): Payer: Medicare Other | Admitting: *Deleted

## 2011-06-02 ENCOUNTER — Telehealth: Payer: Self-pay | Admitting: *Deleted

## 2011-06-02 ENCOUNTER — Other Ambulatory Visit: Payer: Self-pay | Admitting: Internal Medicine

## 2011-06-02 ENCOUNTER — Encounter: Payer: Medicare Other | Admitting: Physician Assistant

## 2011-06-02 DIAGNOSIS — I4891 Unspecified atrial fibrillation: Secondary | ICD-10-CM

## 2011-06-02 LAB — POCT INR: INR: 3.1

## 2011-06-02 NOTE — Progress Notes (Signed)
Opened note only to reissue prescription for stockings.  Will hand-write.

## 2011-06-02 NOTE — Telephone Encounter (Signed)
CALLED [facility] TO LET HER KNOW THAT A HEMATOLOGY REFERRAL HAS BEEN MADE FOR HER, THE HEMATOLOGY OFFICE WILL CALL HER TO SET UP THE APPT. REFERRAL ORDER WAS FAXED TO [name] AT [phone].  Stefen Juba NT   4-22-013  11:48AM

## 2011-06-02 NOTE — Telephone Encounter (Signed)
Rx mailed to pt for compression stockings.Hilda Blades Shanna Un RN 06/02/11 4PM

## 2011-06-09 ENCOUNTER — Telehealth: Payer: Self-pay | Admitting: Oncology

## 2011-06-09 NOTE — Telephone Encounter (Signed)
S/w the pt and she is aware of her new pt appt on 06/16/2011 with dr Humphrey Rolls

## 2011-06-11 ENCOUNTER — Telehealth: Payer: Self-pay | Admitting: Internal Medicine

## 2011-06-11 ENCOUNTER — Telehealth: Payer: Self-pay | Admitting: Oncology

## 2011-06-11 NOTE — Telephone Encounter (Signed)
[facility] states that the order was shipped out on the 16th.  2 Liters will not make a difference with the humidifier bottle.  3 Liters is recommended to use the humidifier to see a difference. Per Corene Cornea at [facility],  the patient can use some [place] Jelly to keep her noise from drying out.

## 2011-06-11 NOTE — Telephone Encounter (Signed)
Referred by Dr. Annamarie Dawley Dx- Anemia

## 2011-06-13 ENCOUNTER — Ambulatory Visit: Payer: Medicare Other | Admitting: Oncology

## 2011-06-16 ENCOUNTER — Other Ambulatory Visit: Payer: Self-pay | Admitting: Medical Oncology

## 2011-06-16 ENCOUNTER — Other Ambulatory Visit (HOSPITAL_BASED_OUTPATIENT_CLINIC_OR_DEPARTMENT_OTHER): Payer: Medicare Other | Admitting: Lab

## 2011-06-16 ENCOUNTER — Ambulatory Visit (INDEPENDENT_AMBULATORY_CARE_PROVIDER_SITE_OTHER): Payer: Medicare Other

## 2011-06-16 ENCOUNTER — Telehealth: Payer: Self-pay | Admitting: Oncology

## 2011-06-16 ENCOUNTER — Telehealth: Payer: Self-pay | Admitting: Cardiology

## 2011-06-16 ENCOUNTER — Other Ambulatory Visit: Payer: Medicare Other | Admitting: Lab

## 2011-06-16 ENCOUNTER — Encounter: Payer: Self-pay | Admitting: Oncology

## 2011-06-16 ENCOUNTER — Ambulatory Visit (HOSPITAL_BASED_OUTPATIENT_CLINIC_OR_DEPARTMENT_OTHER): Payer: Medicare Other | Admitting: Oncology

## 2011-06-16 VITALS — BP 136/65 | HR 91 | Temp 97.8°F | Ht 62.0 in | Wt 184.6 lb

## 2011-06-16 DIAGNOSIS — J449 Chronic obstructive pulmonary disease, unspecified: Secondary | ICD-10-CM

## 2011-06-16 DIAGNOSIS — D539 Nutritional anemia, unspecified: Secondary | ICD-10-CM

## 2011-06-16 DIAGNOSIS — D649 Anemia, unspecified: Secondary | ICD-10-CM

## 2011-06-16 DIAGNOSIS — I4891 Unspecified atrial fibrillation: Secondary | ICD-10-CM

## 2011-06-16 DIAGNOSIS — I2789 Other specified pulmonary heart diseases: Secondary | ICD-10-CM

## 2011-06-16 DIAGNOSIS — J4489 Other specified chronic obstructive pulmonary disease: Secondary | ICD-10-CM

## 2011-06-16 LAB — COMPREHENSIVE METABOLIC PANEL
ALT: 18 U/L (ref 0–35)
AST: 21 U/L (ref 0–37)
Albumin: 4.3 g/dL (ref 3.5–5.2)
Alkaline Phosphatase: 75 U/L (ref 39–117)
BUN: 22 mg/dL (ref 6–23)
CO2: 32 mEq/L (ref 19–32)
Calcium: 9.1 mg/dL (ref 8.4–10.5)
Chloride: 99 mEq/L (ref 96–112)
Creatinine, Ser: 1.06 mg/dL (ref 0.50–1.10)
Glucose, Bld: 225 mg/dL — ABNORMAL HIGH (ref 70–99)
Potassium: 3.8 mEq/L (ref 3.5–5.3)
Sodium: 141 mEq/L (ref 135–145)
Total Bilirubin: 0.3 mg/dL (ref 0.3–1.2)
Total Protein: 6.8 g/dL (ref 6.0–8.3)

## 2011-06-16 LAB — CBC WITH DIFFERENTIAL/PLATELET
BASO%: 0.6 % (ref 0.0–2.0)
Basophils Absolute: 0.1 10*3/uL (ref 0.0–0.1)
EOS%: 1.8 % (ref 0.0–7.0)
Eosinophils Absolute: 0.2 10*3/uL (ref 0.0–0.5)
HCT: 32.2 % — ABNORMAL LOW (ref 34.8–46.6)
HGB: 10 g/dL — ABNORMAL LOW (ref 11.6–15.9)
LYMPH%: 18.9 % (ref 14.0–49.7)
MCH: 26.4 pg (ref 25.1–34.0)
MCHC: 31.1 g/dL — ABNORMAL LOW (ref 31.5–36.0)
MCV: 84.9 fL (ref 79.5–101.0)
MONO#: 0.9 10*3/uL (ref 0.1–0.9)
MONO%: 10.8 % (ref 0.0–14.0)
NEUT#: 5.9 10*3/uL (ref 1.5–6.5)
NEUT%: 67.9 % (ref 38.4–76.8)
Platelets: 265 10*3/uL (ref 145–400)
RBC: 3.79 10*6/uL (ref 3.70–5.45)
RDW: 22 % — ABNORMAL HIGH (ref 11.2–14.5)
WBC: 8.7 10*3/uL (ref 3.9–10.3)
lymph#: 1.6 10*3/uL (ref 0.9–3.3)
nRBC: 0 % (ref 0–0)

## 2011-06-16 LAB — POCT INR: INR: 2.8

## 2011-06-16 NOTE — Progress Notes (Signed)
[name] [id number] October 24, 1950 61 y.o. 06/16/2011 2:58 PM  CC Dr. Annamarie Dawley,  REASON FOR CONSULTATION:  61 year old female with anemia likely multifactorial do to chronic disease and possibly iron deficiency. Patient is seen for discussion and evaluation and management   REFERRING PHYSICIAN: Dr. Guy Sandifer  HISTORY OF PRESENT ILLNESS:  [name] is a 61 y.o. female With multiple medical problems. Please see this listed below. Patient tells me that recently she was admitted with a hemoglobin of 7.1 requiring transfusions. She has known about her anemia for quite some time. But most recently she has had increasing dropping of her H&H. She was admitted about a month ago with a hemoglobin of 7.1 she required blood transfusions. Her followup hemoglobin was only in the 9 range. Because of this she is referred to hematology oncology. Patient tells me and it is documented that she's had a full GI workup that did not reveal an avid evidence of a blood loss. She herself denies having any kind of bleeding problems whatsoever no easy bruising. She is symptomatic from her anemia with shortness of breath. But she also suffers from moderate to severe pulmonary hypertension she also has COPD she also has cardiac disease. Patient is O2 dependent. She also has diabetes and she is on insulin. She has never been told whether or not she has any type of renal insufficiency.   Past Medical History  Diagnosis Date  . Depression   . GERD (gastroesophageal reflux disease)     Followed by [name] // small bowel capsule endoscopy (11/2009) - minimal chronic inflammation, otherwise negative for  H.pylori, metaplasia, dysplasia  . Hypertension   . Hyperlipidemia   . Diabetes mellitus type II, uncontrolled     on insulin therapy  . COPD (chronic obstructive pulmonary disease) 2008    started home O2 (12/2010) // Prior significant smoker  // her CT of the chest there was no evidence of  primary carcinoid tumor , scattered normal-sized mediastinal and hilar as well as axillary lymph nodes noted., no significant lymphadenopathy. COPD and emphysema with no acute cardiopulmonary disease noted , mild cardiomegaly and solitary calcified  plaque  . Moderate to severe pulmonary hypertension     severe per TEE (08/2010) - Peak RV-RA gradient 35m Hg  . Mitral regurgitation     mild to moderate per TEE (08/2010)  . Shingles   . Anemia     Anemia panel (08/2009) - iron 21, TIBC 441, UIBC 420,  ferritin 11 // Pt has been unable to tolerate iron supplementation due to chronic abdominal discomfort, source of which still is not clear. // Repeat EGD (05/2011) negative, colonoscopy (05/2011) showing only int hemorrhoids, no specific cause of anemia  . History of colonic polyps     Colonoscopy (10/2005) - colon polyp with path nonadenomatous, no malignancy - by Dr. [name] // Colonoscopy (02/2002) - 2 polyps noted, 1 - adenomatous, 2 - hyperplastic // Followed by Dr. [name]// Repeat Colonoscopy (05/2011) - single tubular adenoma polyp - performed by Dr. [name] . Anxiety   . IBS (irritable bowel syndrome)   . Osteoporosis     DEXA (12/2004) - L-spine T score -2.6, Left hip -0.1  . Fibromyalgia   . Obstructive sleep apnea     on CPAP. // Sleep study (06/2009) - Moderate sleep apnea/ hypopnea syndrome , AHI 17.8 per hour with nonpositional hypopneas. CPAP titration to 12 CWP, AHI 2.4 per hour. Small resMed Quattro full-face mask with heated  humidifier  . Restless leg syndrome   . Mitral stenosis     moderate per TEE (08/2010) // Mitral valve replacement with a 27-mm pericardial porcine valve (Medtronic Mosaic valve, serial #[id number]) on 09/20/2010 - [name] // Followed at [facility], Dr. Stanford Breed  . Bilateral carotid artery stenosis     s/p right endarterectomy (06/2010). // Carotid US (07/2010) -  60%-79% ICA stenosis, mid range of scale by velocity.  Left: Moderate-to-severe  calcific and non-calcific plaque origin andproximal ICA and ECA.  60%-79% ICA stenosis, highest end of scale // Followed by [name].  . Peripheral vascular disease   . Coronary artery disease   . Internal hemorrhoids     small internal hemorrhoids noted on colonoscopy 05/2011    Past Surgical History  Procedure Date  . Orif clavicle fracture 01/2004    by Thana Farr. [name], M.D for Right clavicle nonunion.  . Tonsillectomy   . Carotid endarterectomy 07/04/2010    right, by [name] for asymptomatic right carotid artery stenosis  . Lipoma exision 08/2005    occipital lipoma 1.5cm - by Dr. [name]  . Hysteroscopy with endometrial ablation 06/2001    for persistent post-menopausal bleeding // by S. [name], M.D.  . Mitral valve replacement 09/20/10     with a 27-mm pericardial porcine valve (Medtronic Mosaic valve, serial #[id number]). 09/20/10, Dr Prescott Gum  . Left atrial maze procedure 09/20/10    for paroxysmal atrial fibrillation (Dr. Prescott Gum)  . Placement of left chest tube. 09/24/2010    Dr Prescott Gum  . Pr vein bypass graft,aorto-fem-pop May 2012  . Cardiac valve replacement Aug. 2012  . Colonoscopy 05/12/2011    performed by Dr. Michail Sermon. Showing small internal hemorrhoids, single tubular adenoma polyp  . Esophagogastroduodenoscopy 05/12/2011    performed by Dr. Michail Sermon. Negative for ulcerations, biopsy negative for evidence of celiac sprue    Family History  Problem Relation Age of Onset  . Heart disease Father 108    died to MI at 62yo  . Heart disease Brother 54    died of MI at 49yo    Social History History  Substance Use Topics  . Smoking status: Former Smoker -- 1.0 packs/day for 50 years    Types: Cigarettes    Quit date: 07/12/2010  . Smokeless tobacco: Not on file  . Alcohol Use: No    Allergies  Allergen Reactions  . Lorazepam     Patient's sister noted that ativan caused the patient to become extremely confused during hospitalization 09/2010  .  Morphine     REACTION: Unknown reaction  . Percocet (Oxycodone-Acetaminophen)     headache  . Tramadol Hcl     REACTION: swelling    Current Outpatient Prescriptions  Medication Sig Dispense Refill  . ACCU-CHEK FASTCLIX LANCETS MISC 1 each by Does not apply route 3 (three) times daily before meals. Dx code- 250.00  102 each  12  . albuterol (PROVENTIL HFA;VENTOLIN HFA) 108 (90 BASE) MCG/ACT inhaler Inhale 2 puffs into the lungs every 6 (six) hours as needed. For breathing      . ALPRAZolam (XANAX) 1 MG tablet Take 1 tablet (1 mg total) by mouth 3 (three) times daily as needed for sleep or anxiety.  90 tablet  5  . amiodarone (PACERONE) 200 MG tablet Take 1 tablet (200 mg total) by mouth daily.  30 tablet  12  . aspirin 81 MG tablet Take 81 mg by mouth daily.        Marland Kitchen  buPROPion (WELLBUTRIN XL) 150 MG 24 hr tablet Take 1 tablet (150 mg total) by mouth at bedtime.  30 tablet  5  . DULoxetine (CYMBALTA) 30 MG capsule Take 3 tablets by mouth once daily  90 capsule  7  . Elastic Bandages & Supports (TRUFORM STOCKINGS 20-30MMHG) MISC Wear compression stockings throughout the day, and take off at nighttime.  2 each  1  . ferrous gluconate (FERGON) 324 MG tablet Take 1 tablet (324 mg total) by mouth 2 (two) times daily with a meal.  120 tablet  3  . Fluticasone-Salmeterol (ADVAIR DISKUS) 250-50 MCG/DOSE AEPB Inhale 1 puff into the lungs every 12 (twelve) hours.  1 each  11  . furosemide (LASIX) 80 MG tablet Take 1 tablet (80 mg total) by mouth 2 (two) times daily.  60 tablet  12  . gabapentin (NEURONTIN) 300 MG capsule Take 1 capsule (300 mg total) by mouth 2 (two) times daily.  60 capsule  5  . glucose blood test strip Use to test blood sugar 3 times daily.  100 each  11  . HYDROcodone-acetaminophen (NORCO) 5-325 MG per tablet 1-2 tablets every 6 hours as needed for pain. Do not take with alcohol, do no drive or operate heavy machinery if drowsiness occurs. Do not take more than instructed.  240  tablet  3  . insulin aspart (NOVOLOG) 100 UNIT/ML injection Inject 5 Units into the skin 3 (three) times daily before meals.      . insulin detemir (LEVEMIR) 100 UNIT/ML injection Inject 50 Units into the skin at bedtime. DISPENSE 2 VIALS/ MONTH      . Insulin Pen Needle 31G X 5 MM MISC Please check blood sugars 4 times daily. Before breakfast and all meals.  100 each  11  . Insulin Syringe-Needle U-100 (INSULIN SYRINGE .3CC/31GX5/16") 31G X 5/16" 0.3 ML MISC 1 Syringe by Does not apply route 2 (two) times daily at 10 AM and 5 PM.  100 each  11  . Insulin Syringe-Needle U-100 (INSULIN SYRINGE 1CC/30GX1/2") 30G X 1/2" 1 ML MISC 1 Container by Does not apply route 2 (two) times daily.  100 each  11  . Lancets Misc. (ACCU-CHEK FASTCLIX LANCET) KIT 1 each by Does not apply route 3 (three) times daily.  1 kit  0  . loratadine (CLARITIN) 10 MG tablet Take 1 tablet (10 mg total) by mouth daily.  90 tablet  3  . omeprazole (PRILOSEC) 40 MG capsule Take 40 mg by mouth 2 (two) times daily.       . potassium chloride SA (K-DUR,KLOR-CON) 20 MEQ tablet Take 1 tablet (20 mEq total) by mouth 3 (three) times daily.  90 tablet  12  . rosuvastatin (CRESTOR) 20 MG tablet Take 1 tablet (20 mg total) by mouth daily.  30 tablet  7  . sennosides-docusate sodium (SENOKOT-S) 8.6-50 MG tablet Take 2 tablets by mouth daily. Hold for loose stools  30 tablet  5  . tiotropium (SPIRIVA) 18 MCG inhalation capsule Place 18 mcg into inhaler and inhale daily.      Marland Kitchen warfarin (COUMADIN) 2 MG tablet Take 2 mg by mouth daily.        REVIEW OF SYSTEMS:  Constitutional: positive for fatigue Ears, nose, mouth, throat, and face: positive for nasal congestion Respiratory: positive for asthma, cough, dyspnea on exertion and wheezing Cardiovascular: positive for dyspnea, fatigue, near-syncope and orthopnea Gastrointestinal: positive for change in bowel habits, dyspepsia and reflux symptoms Genitourinary:negative Hematologic/lymphatic:  positive for anemia  Musculoskeletal:positive for arthralgias, back pain, muscle weakness and stiff joints Neurological: positive for dizziness, gait problems and weakness Endocrine: positive for diabetic symptoms including increased fatigue and skin dryness  ECOG performance Status: 2  PHYSICAL EXAMINATION: Blood pressure 136/65, pulse 91, temperature 97.8 F (36.6 C), temperature source Oral, height _0  (1.575 m), weight 184 lb 9.6 oz (83.734 kg). QZR:AQTMA, comfortable, cooperative, dyspneic, flushed, obese, pale and smiling SKIN: no rashes or significant lesions HEAD: Normocephalic EYES: PERRLA, EOMI EARS: External ears normal OROPHARYNX:no exudate, no erythema and lips, buccal mucosa, and tongue normal  NECK: supple, no adenopathy LYMPH:  no palpable lymphadenopathy BREAST:not examined LUNGS: coarse sounds heard, decreased breath sounds, scattered rales bilaterally HEART: regular rate & rhythm ABDOMEN:abdomen soft, non-tender, obese, normal bowel sounds and no masses or organomegaly BACK: Back symmetric, no curvature. EXTREMITIES:no clubbing, no cyanosis  NEURO: alert & oriented x 3 with fluent speech, no focal motor/sensory deficits    STUDIES/RESULTS: Dg Chest 2 View  05/23/2011  *RADIOLOGY REPORT*  Clinical Data: Status post fall; concern for chest injury.  CHEST - 2 VIEW  Comparison: Chest radiograph performed 05/07/2011  Findings: There is stable elevation of the left hemidiaphragm. Left basilar opacity likely reflects persistent atelectasis.  Mild vascular congestion is noted.  No definite pleural effusion or pneumothorax is seen.  The heart is borderline normal in size; the patient is status post median sternotomy.  Hardware is again noted along the right clavicle.  No acute osseous abnormalities are seen.  IMPRESSION: Left basilar airspace opacity likely reflects persistent atelectasis; mild vascular congestion noted.  Stable elevation of the left hemidiaphragm.  No  displaced rib fractures seen.  Original Report Authenticated By: Santa Lighter, M.D.   Dg Hip Complete Right  05/23/2011  *RADIOLOGY REPORT*  Clinical Data: Status post fall; right hip pain and pelvic pain.  RIGHT HIP - COMPLETE 2+ VIEW  Comparison: None.  Findings: There is no evidence of fracture or dislocation.  Both femoral heads are seated normally within their respective acetabula.  The proximal right femur appears intact.  No significant degenerative change is appreciated.  The sacroiliac joints are unremarkable in appearance.  The visualized bowel gas pattern is grossly unremarkable in appearance.  IMPRESSION: No evidence of fracture or dislocation.  Original Report Authenticated By: Santa Lighter, M.D.   Ct Head Wo Contrast  05/23/2011  *RADIOLOGY REPORT*  Clinical Data:  Status post fall from standing position; head and neck pain.  CT HEAD WITHOUT CONTRAST AND CT CERVICAL SPINE WITHOUT CONTRAST  Technique:  Multidetector CT imaging of the head and cervical spine was performed following the standard protocol without intravenous contrast.  Multiplanar CT image reconstructions of the cervical spine were also generated.  Comparison: CT of the head performed 09/18/2010, and MRI of the brain / MRA of the head and neck performed 08/03/2010  CT HEAD  Findings: There is no evidence of acute infarction, mass lesion, or intra- or extra-axial hemorrhage on CT.  Evaluation is mildly suboptimal due to motion artifact.  The posterior fossa, including the cerebellum, brainstem and fourth ventricle, is within normal limits.  The third and lateral ventricles, and basal ganglia are unremarkable in appearance.  The cerebral hemispheres are symmetric in appearance, with normal gray- white differentiation.  No mass effect or midline shift is seen.  There is no evidence of fracture; visualized osseous structures are unremarkable in appearance.  The orbits are within normal limits. The paranasal sinuses and mastoid air  cells are well-aerated.  Mild soft  tissue swelling is noted at the left posterior vertex.  IMPRESSION:  1.  No evidence of traumatic intracranial injury or fracture. 2.  Mild soft tissue swelling noted at the left posterior vertex.  CT CERVICAL SPINE  Findings: There is no evidence of fracture or subluxation. Vertebral bodies demonstrate normal height and alignment. Intervertebral disc spaces are preserved.  Prevertebral soft tissues are within normal limits.  The visualized neural foramina are grossly unremarkable.  The thyroid gland is unremarkable in appearance.  The visualized lung apices are clear.  No significant soft tissue abnormalities are seen.  IMPRESSION: No evidence of fracture or subluxation along the cervical spine.  Original Report Authenticated By: Santa Lighter, M.D.   Ct Cervical Spine Wo Contrast  05/23/2011  *RADIOLOGY REPORT*  Clinical Data:  Status post fall from standing position; head and neck pain.  CT HEAD WITHOUT CONTRAST AND CT CERVICAL SPINE WITHOUT CONTRAST  Technique:  Multidetector CT imaging of the head and cervical spine was performed following the standard protocol without intravenous contrast.  Multiplanar CT image reconstructions of the cervical spine were also generated.  Comparison: CT of the head performed 09/18/2010, and MRI of the brain / MRA of the head and neck performed 08/03/2010  CT HEAD  Findings: There is no evidence of acute infarction, mass lesion, or intra- or extra-axial hemorrhage on CT.  Evaluation is mildly suboptimal due to motion artifact.  The posterior fossa, including the cerebellum, brainstem and fourth ventricle, is within normal limits.  The third and lateral ventricles, and basal ganglia are unremarkable in appearance.  The cerebral hemispheres are symmetric in appearance, with normal gray- white differentiation.  No mass effect or midline shift is seen.  There is no evidence of fracture; visualized osseous structures are unremarkable in  appearance.  The orbits are within normal limits. The paranasal sinuses and mastoid air cells are well-aerated.  Mild soft tissue swelling is noted at the left posterior vertex.  IMPRESSION:  1.  No evidence of traumatic intracranial injury or fracture. 2.  Mild soft tissue swelling noted at the left posterior vertex.  CT CERVICAL SPINE  Findings: There is no evidence of fracture or subluxation. Vertebral bodies demonstrate normal height and alignment. Intervertebral disc spaces are preserved.  Prevertebral soft tissues are within normal limits.  The visualized neural foramina are grossly unremarkable.  The thyroid gland is unremarkable in appearance.  The visualized lung apices are clear.  No significant soft tissue abnormalities are seen.  IMPRESSION: No evidence of fracture or subluxation along the cervical spine.  Original Report Authenticated By: Santa Lighter, M.D.   LABS: Results for ERRICKA, FALKNER (MRN [id number]) as of 06/16/2011 15:53  Ref. Range 06/16/2011 14:30  WBC Latest Range: 4.0-10.5 K/uL 8.7  RBC Latest Range: 3.87-5.11 MIL/uL 3.79  Hemoglobin Latest Range: 12.0-15.0 g/dL 10.0 (L)  HCT Latest Range: 36.0-46.0 % 32.2 (L)  MCV Latest Range: 78.0-100.0 fL 84.9  MCH Latest Range: 26.0-34.0 pg 26.4  MCHC Latest Range: 30.0-36.0 g/dL 31.1 (L)  RDW Latest Range: 11.5-15.5 % 22.0 (H)  Platelets Latest Range: 150-400 K/uL 265  NEUT% Latest Range: 38.4-76.8 % 67.9  LYMPH% Latest Range: 14.0-49.7 % 18.9  MONO% Latest Range: 0.0-14.0 % 10.8  EOS% Latest Range: 0.0-7.0 % 1.8  BASO% Latest Range: 0.0-2.0 % 0.6  NEUT# Latest Range: 1.7-7.7 K/uL 5.9  MONO# Latest Range: 0.1-0.9 10e3/uL 0.9  Eosinophils Absolute Latest Range: 0.0-0.5 10e3/uL 0.2  Basophils Absolute Latest Range: 0.0-0.1 K/uL 0.1  lymph# Latest Range: 0.9-3.3  10e3/uL 1.6  nRBC Latest Range: 0-0 % 0    ASSESSMENT    61 year old female with chronic anemia likely multifactorial.  with hemoglobin of 7.1On recent admission.  Patient at that time required blood transfusions. I think a full workup is needed. Patient most likely has either anemia of chronic disease due to her multiple medical problems versus iron deficiency B12 folate or a primary bone marrow problem such as myelodysplastic syndrome versus a plasma cell dyscrasia.    PLAN:    I have discussed the workup for chronic anemia. We will get iron studies B12 folate levels we will also check an erythropoietin level. I have recommended the possibility of a bone marrow biopsy and aspirate as well. We will also check a SPEP. She understands what a bone marrow biopsy and aspirate consists of. We discussed the use of erythropoietin as part of her therapy should she have anemia of chronic disease. She will be seen back in one week's time once I have the results we will go over these. And then if the need arises to do a bone marrow biopsy then I will get that scheduled for her.     All questions were answered. The patient knows to call the clinic with any problems, questions or concerns. We can certainly see the patient much sooner if necessary.  Thank you so much for allowing me to participate in the care of [name]. I will continue to follow up the patient with you and assist in her care.  I spent 55 minutes counseling the patient face to face. The total time spent in the appointment was 60 minutes. Marcy Panning, MD Medical/Oncology [facility] [phone] (beeper) [phone] (Office)  06/16/2011, 2:58 PM 06/16/2011, 2:58 PM

## 2011-06-16 NOTE — Patient Instructions (Signed)
1. I will see you back on 5/14 to discuss your lab results

## 2011-06-16 NOTE — Telephone Encounter (Signed)
Patient request return call from nurse DM concerning appnt advise, she can be reached at [phone].  Patient said she has questions and is concerned at this point.

## 2011-06-16 NOTE — Telephone Encounter (Signed)
gve the pt her may 2013 appt calendar

## 2011-06-16 NOTE — Telephone Encounter (Signed)
Spoke with pt, schedule issues completed.

## 2011-06-18 LAB — ERYTHROPOIETIN: Erythropoietin: 247 m[IU]/mL — ABNORMAL HIGH (ref 2.6–34.0)

## 2011-06-18 LAB — IRON AND TIBC
%SAT: 53 % (ref 20–55)
Iron: 213 ug/dL — ABNORMAL HIGH (ref 42–145)
TIBC: 399 ug/dL (ref 250–470)
UIBC: 186 ug/dL (ref 125–400)

## 2011-06-18 LAB — SPEP & IFE WITH QIG
Albumin ELP: 56.7 % (ref 55.8–66.1)
Alpha-1-Globulin: 4.9 % (ref 2.9–4.9)
Alpha-2-Globulin: 12.4 % — ABNORMAL HIGH (ref 7.1–11.8)
Beta 2: 5.3 % (ref 3.2–6.5)
Beta Globulin: 7 % (ref 4.7–7.2)
Gamma Globulin: 13.7 % (ref 11.1–18.8)
IgA: 215 mg/dL (ref 69–380)
IgG (Immunoglobin G), Serum: 823 mg/dL (ref 690–1700)
IgM, Serum: 154 mg/dL (ref 52–322)
Total Protein, Serum Electrophoresis: 6.7 g/dL (ref 6.0–8.3)

## 2011-06-18 LAB — FOLATE: Folate: >20 ng/mL

## 2011-06-18 LAB — VITAMIN B12: Vitamin B-12: 414 pg/mL (ref 211–911)

## 2011-06-24 ENCOUNTER — Ambulatory Visit (INDEPENDENT_AMBULATORY_CARE_PROVIDER_SITE_OTHER): Payer: Medicare Other | Admitting: Cardiology

## 2011-06-24 ENCOUNTER — Telehealth: Payer: Self-pay | Admitting: Oncology

## 2011-06-24 ENCOUNTER — Ambulatory Visit: Payer: Self-pay | Admitting: Cardiology

## 2011-06-24 ENCOUNTER — Ambulatory Visit (HOSPITAL_BASED_OUTPATIENT_CLINIC_OR_DEPARTMENT_OTHER): Payer: Medicare Other | Admitting: Oncology

## 2011-06-24 ENCOUNTER — Encounter: Payer: Self-pay | Admitting: Oncology

## 2011-06-24 ENCOUNTER — Other Ambulatory Visit (HOSPITAL_BASED_OUTPATIENT_CLINIC_OR_DEPARTMENT_OTHER): Payer: Medicare Other | Admitting: Lab

## 2011-06-24 ENCOUNTER — Telehealth: Payer: Self-pay | Admitting: *Deleted

## 2011-06-24 ENCOUNTER — Ambulatory Visit: Payer: Medicare Other | Admitting: Cardiology

## 2011-06-24 ENCOUNTER — Encounter: Payer: Self-pay | Admitting: Cardiology

## 2011-06-24 VITALS — BP 147/70 | HR 81 | Temp 98.3°F | Ht 62.0 in | Wt 185.4 lb

## 2011-06-24 VITALS — BP 110/54 | HR 78 | Ht 62.0 in | Wt 186.0 lb

## 2011-06-24 DIAGNOSIS — I2789 Other specified pulmonary heart diseases: Secondary | ICD-10-CM

## 2011-06-24 DIAGNOSIS — I4891 Unspecified atrial fibrillation: Secondary | ICD-10-CM

## 2011-06-24 DIAGNOSIS — I272 Pulmonary hypertension, unspecified: Secondary | ICD-10-CM

## 2011-06-24 DIAGNOSIS — D649 Anemia, unspecified: Secondary | ICD-10-CM

## 2011-06-24 DIAGNOSIS — I679 Cerebrovascular disease, unspecified: Secondary | ICD-10-CM

## 2011-06-24 DIAGNOSIS — E785 Hyperlipidemia, unspecified: Secondary | ICD-10-CM

## 2011-06-24 DIAGNOSIS — I10 Essential (primary) hypertension: Secondary | ICD-10-CM

## 2011-06-24 DIAGNOSIS — I050 Rheumatic mitral stenosis: Secondary | ICD-10-CM

## 2011-06-24 LAB — CBC WITH DIFFERENTIAL/PLATELET
BASO%: 0.7 % (ref 0.0–2.0)
Basophils Absolute: 0.1 10*3/uL (ref 0.0–0.1)
EOS%: 2.2 % (ref 0.0–7.0)
Eosinophils Absolute: 0.2 10*3/uL (ref 0.0–0.5)
HCT: 31.8 % — ABNORMAL LOW (ref 34.8–46.6)
HGB: 10.1 g/dL — ABNORMAL LOW (ref 11.6–15.9)
LYMPH%: 20.4 % (ref 14.0–49.7)
MCH: 28 pg (ref 25.1–34.0)
MCHC: 31.7 g/dL (ref 31.5–36.0)
MCV: 88.4 fL (ref 79.5–101.0)
MONO#: 0.8 10*3/uL (ref 0.1–0.9)
MONO%: 10.1 % (ref 0.0–14.0)
NEUT#: 5.6 10*3/uL (ref 1.5–6.5)
NEUT%: 66.6 % (ref 38.4–76.8)
Platelets: 213 10*3/uL (ref 145–400)
RBC: 3.59 10*6/uL — ABNORMAL LOW (ref 3.70–5.45)
RDW: 24.4 % — ABNORMAL HIGH (ref 11.2–14.5)
WBC: 8.4 10*3/uL (ref 3.9–10.3)
lymph#: 1.7 10*3/uL (ref 0.9–3.3)
nRBC: 0 % (ref 0–0)

## 2011-06-24 NOTE — Assessment & Plan Note (Signed)
Blood pressure controlled. Continue present medications. 

## 2011-06-24 NOTE — Progress Notes (Signed)
HPI: Pleasant female I saw in April of 2012 for evaluation of chest pain. Carotid Dopplers in May 2012 showed an 80-99% right and a 60-79% left stenosis. Patient had right carotid endarterectomy and is now followed by vascular surgery. Patient had previous workup for mitral stenosis. Cardiac catheterization in August of 2012 showed normal LV function, normal coronary arteries, severe mitral stenosis, severely elevated pulmonary capillary wedge pressure and severe pulmonary hypertension. The patient was admitted following the procedure because of atrial fibrillation. She deteriorated and ultimately had TEE cardioversion. The patient had mitral valve replacement with a 27-mm pericardial porcine valve and left atrial maze procedure for paroxysmal atrial fibrillation on September 21, 2010. Patient did require chest tube placement for pleural effusion postoperatively. She also developed atrial fibrillation postoperatively and has been treated with amiodarone. Patient had elective DCCV in Oct 2012. Echocardiogram in November of 2012 showed an ejection fraction of 55-65%. There was grade 2 diastolic dysfunction. There was a bioprosthetic mitral valve. The mean gradient was 5 mm of mercury. The left atrium was mildly dilated. There was severely elevated pulmonary pressures. I last saw her in October of 2012. Since then, she has dyspnea on exertion but no orthopnea, PND and her pedal edema is controlled with diuretics. No chest pain. No palpitations. She has fallen recently but denies syncope. She is being evaluated for anemia which will require bone marrow biopsy.   Current Outpatient Prescriptions  Medication Sig Dispense Refill  . ACCU-CHEK FASTCLIX LANCETS MISC 1 each by Does not apply route 3 (three) times daily before meals. Dx code- 250.00  102 each  12  . albuterol (PROVENTIL HFA;VENTOLIN HFA) 108 (90 BASE) MCG/ACT inhaler Inhale 2 puffs into the lungs every 6 (six) hours as needed. For breathing      .  ALPRAZolam (XANAX) 1 MG tablet Take 1 tablet (1 mg total) by mouth 3 (three) times daily as needed for sleep or anxiety.  90 tablet  5  . amiodarone (PACERONE) 200 MG tablet Take 1 tablet (200 mg total) by mouth daily.  30 tablet  12  . aspirin 81 MG tablet Take 81 mg by mouth daily.        Marland Kitchen buPROPion (WELLBUTRIN XL) 150 MG 24 hr tablet Take 1 tablet (150 mg total) by mouth at bedtime.  30 tablet  5  . DULoxetine (CYMBALTA) 30 MG capsule Take 3 tablets by mouth once daily  90 capsule  7  . Elastic Bandages & Supports (TRUFORM STOCKINGS 20-30MMHG) MISC Wear compression stockings throughout the day, and take off at nighttime.  2 each  1  . ferrous gluconate (FERGON) 324 MG tablet Take 1 tablet (324 mg total) by mouth 2 (two) times daily with a meal.  120 tablet  3  . Fluticasone-Salmeterol (ADVAIR DISKUS) 250-50 MCG/DOSE AEPB Inhale 1 puff into the lungs every 12 (twelve) hours.  1 each  11  . furosemide (LASIX) 80 MG tablet Take 1 tablet (80 mg total) by mouth 2 (two) times daily.  60 tablet  12  . gabapentin (NEURONTIN) 300 MG capsule Take 1 capsule (300 mg total) by mouth 2 (two) times daily.  60 capsule  5  . glucose blood test strip Use to test blood sugar 3 times daily.  100 each  11  . HYDROcodone-acetaminophen (NORCO) 5-325 MG per tablet 1-2 tablets every 6 hours as needed for pain. Do not take with alcohol, do no drive or operate heavy machinery if drowsiness occurs. Do not take more  than instructed.  240 tablet  3  . insulin aspart (NOVOLOG) 100 UNIT/ML injection Inject 7 Units into the skin 3 (three) times daily before meals. ( Sliding Scale )      . insulin detemir (LEVEMIR) 100 UNIT/ML injection Inject 50 Units into the skin at bedtime. DISPENSE 2 VIALS/ MONTH      . Insulin Syringe-Needle U-100 (INSULIN SYRINGE .3CC/31GX5/16") 31G X 5/16" 0.3 ML MISC 1 Syringe by Does not apply route 2 (two) times daily at 10 AM and 5 PM.  100 each  11  . Insulin Syringe-Needle U-100 (INSULIN SYRINGE  1CC/30GX1/2") 30G X 1/2" 1 ML MISC 1 Container by Does not apply route 2 (two) times daily.  100 each  11  . Lancets Misc. (ACCU-CHEK FASTCLIX LANCET) KIT 1 each by Does not apply route 3 (three) times daily.  1 kit  0  . loratadine (CLARITIN) 10 MG tablet Take 1 tablet (10 mg total) by mouth daily.  90 tablet  3  . omeprazole (PRILOSEC) 40 MG capsule Take 40 mg by mouth 2 (two) times daily.       . potassium chloride SA (K-DUR,KLOR-CON) 20 MEQ tablet Take 1 tablet (20 mEq total) by mouth 3 (three) times daily.  90 tablet  12  . rosuvastatin (CRESTOR) 20 MG tablet Take 1 tablet (20 mg total) by mouth daily.  30 tablet  7  . sennosides-docusate sodium (SENOKOT-S) 8.6-50 MG tablet Take 2 tablets by mouth daily. Hold for loose stools  30 tablet  5  . tiotropium (SPIRIVA) 18 MCG inhalation capsule Place 18 mcg into inhaler and inhale daily.      Marland Kitchen warfarin (COUMADIN) 2 MG tablet Take 2 mg by mouth daily.         Past Medical History  Diagnosis Date  . Depression   . GERD (gastroesophageal reflux disease)     Followed by [name] // small bowel capsule endoscopy (11/2009) - minimal chronic inflammation, otherwise negative for  H.pylori, metaplasia, dysplasia  . Hypertension   . Hyperlipidemia   . Diabetes mellitus type II, uncontrolled     on insulin therapy  . COPD (chronic obstructive pulmonary disease) 2008    started home O2 (12/2010) // Prior significant smoker  // her CT of the chest there was no evidence of primary carcinoid tumor , scattered normal-sized mediastinal and hilar as well as axillary lymph nodes noted., no significant lymphadenopathy. COPD and emphysema with no acute cardiopulmonary disease noted , mild cardiomegaly and solitary calcified  plaque  . Moderate to severe pulmonary hypertension     severe per TEE (08/2010) - Peak RV-RA gradient 52m Hg  . Mitral regurgitation     mild to moderate per TEE (08/2010)  . Shingles   . Anemia     Anemia panel (08/2009) - iron 21,  TIBC 441, UIBC 420,  ferritin 11 // Pt has been unable to tolerate iron supplementation due to chronic abdominal discomfort, source of which still is not clear. // Repeat EGD (05/2011) negative, colonoscopy (05/2011) showing only int hemorrhoids, no specific cause of anemia  . History of colonic polyps     Colonoscopy (10/2005) - colon polyp with path nonadenomatous, no malignancy - by Dr. [name] // Colonoscopy (02/2002) - 2 polyps noted, 1 - adenomatous, 2 - hyperplastic // Followed by Dr. [name]// Repeat Colonoscopy (05/2011) - single tubular adenoma polyp - performed by Dr. [name] . Anxiety   . IBS (irritable bowel syndrome)   . Osteoporosis  DEXA (12/2004) - L-spine T score -2.6, Left hip -0.1  . Fibromyalgia   . Obstructive sleep apnea     on CPAP. // Sleep study (06/2009) - Moderate sleep apnea/ hypopnea syndrome , AHI 17.8 per hour with nonpositional hypopneas. CPAP titration to 12 CWP, AHI 2.4 per hour. Small resMed Quattro full-face mask with heated humidifier  . Restless leg syndrome   . Mitral stenosis     moderate per TEE (08/2010) // Mitral valve replacement with a 27-mm pericardial porcine valve (Medtronic Mosaic valve, serial #[id number]) on 09/20/2010 - [name] // Followed at [facility], Dr. Stanford Breed  . Bilateral carotid artery stenosis     s/p right endarterectomy (06/2010). // Carotid US (07/2010) -  60%-79% ICA stenosis, mid range of scale by velocity.  Left: Moderate-to-severe calcific and non-calcific plaque origin andproximal ICA and ECA.  60%-79% ICA stenosis, highest end of scale // Followed by [name].  . Peripheral vascular disease   . Coronary artery disease   . Internal hemorrhoids     small internal hemorrhoids noted on colonoscopy 05/2011    Past Surgical History  Procedure Date  . Orif clavicle fracture 01/2004    by Thana Farr. [name], M.D for Right clavicle nonunion.  . Tonsillectomy   . Carotid endarterectomy 07/04/2010    right, by  [name] for asymptomatic right carotid artery stenosis  . Lipoma exision 08/2005    occipital lipoma 1.5cm - by Dr. [name]  . Hysteroscopy with endometrial ablation 06/2001    for persistent post-menopausal bleeding // by S. [name], M.D.  . Mitral valve replacement 09/20/10     with a 27-mm pericardial porcine valve (Medtronic Mosaic valve, serial #[id number]). 09/20/10, Dr Prescott Gum  . Left atrial maze procedure 09/20/10    for paroxysmal atrial fibrillation (Dr. Prescott Gum)  . Placement of left chest tube. 09/24/2010    Dr Prescott Gum  . Pr vein bypass graft,aorto-fem-pop May 2012  . Cardiac valve replacement Aug. 2012  . Colonoscopy 05/12/2011    performed by Dr. Michail Sermon. Showing small internal hemorrhoids, single tubular adenoma polyp  . Esophagogastroduodenoscopy 05/12/2011    performed by Dr. Michail Sermon. Negative for ulcerations, biopsy negative for evidence of celiac sprue    History   Social History  . Marital Status: Divorced    Spouse Name: N/A    Number of Children: N/A  . Years of Education: N/A   Occupational History  . Not on file.   Social History Main Topics  . Smoking status: Former Smoker -- 1.0 packs/day for 50 years    Types: Cigarettes    Quit date: 07/12/2010  . Smokeless tobacco: Not on file  . Alcohol Use: No  . Drug Use: No  . Sexually Active: No   Other Topics Concern  . Not on file   Social History Narrative  . No narrative on file    ROS: frequent falls and dyspnea on exertion but no fevers or chills, productive cough, hemoptysis, dysphasia, odynophagia, melena, hematochezia, dysuria, hematuria, rash, seizure activity, orthopnea, PND, pedal edema. Remaining systems are negative.  Physical Exam: Well-developed chronically ill appearing in no acute distress.  Skin is warm and dry.  HEENT is normal.  Neck is supple.  Chest diminished breath sounds throughout Cardiovascular exam is regular rate and rhythm. 1/6 systolic ejection  murmur Abdominal exam nontender or distended. No masses palpated. Extremities show no edema. neuro grossly intact   Electrocardiogram 05/23/2011 showed sinus rhythm with first degree AV  block.  Sinus rhythm with first degree AV block. RV conduction delay. No ST changes.

## 2011-06-24 NOTE — Telephone Encounter (Signed)
Thanks for getting it together for me. I am off today, but will fill it out tomorrow when I get back to work.   Thanks Edd Fabian!  [name], D.O.

## 2011-06-24 NOTE — Assessment & Plan Note (Signed)
Patient remains in sinus rhythm. She has had a previous maze procedure. Discontinue amiodarone. Hopefully she will maintain sinus rhythm. She has fallen multiple times recently. Discontinue Coumadin.

## 2011-06-24 NOTE — Assessment & Plan Note (Signed)
Status post mitral valve replacement. Continue SBE prophylaxis. 

## 2011-06-24 NOTE — Assessment & Plan Note (Signed)
History of severe pulmonary hypertension. Euvolemic on examination. Continue present dose of diuretic.

## 2011-06-24 NOTE — Assessment & Plan Note (Signed)
Continue statin. 

## 2011-06-24 NOTE — Patient Instructions (Signed)
1. You will be scheduled to have a bone marrow biopsy and aspirate by radiology  Bone Marrow Aspiration and Bone Biopsy Examination of the bone marrow is a valuable test to diagnose blood disorders. A bone marrow biopsy takes a sample of bone and a small amount of fluid and cells from inside the bone. A bone marrow aspiration removes only the marrow. Bone marrow aspiration and bone biopsies are used to stage different disorders of the blood, such as leukemia. Staging will help your caregiver understand how far the disease has progressed.  The tests are also useful in diagnosing:  Fever of unknown origin (FUO).   Bacterial infections and other widespread fungal infections.   Cancers that have spread (metastasized) to the bone marrow.   Diseases that are characterized by a deficiency of an enzyme (storage diseases). This includes:   Niemann-Pick disease.   Gaucher disease.  PROCEDURE  Sites used to get samples include:   Back of your hip bone (posterior iliac crest).   Both aspiration and biopsy.   Front of your hip bone (anterior iliac crest).   Both aspiration and biopsy.   Breastbone (sternum).   Aspiration from your breastbone (done only in adults). This method is rarely used.  When you get a hip bone aspiration:  You are placed lying on your side with the upper knee brought up and flexed with the lower leg straight.   The site is prepared, cleaned with an antiseptic scrub, and draped. This keeps the biopsy area clean.   The skin and the area down to the lining of the bone (periosteum) are made numb with a local anesthetic.   The bone marrow aspiration needle is inserted. You will feel pressure on your bone.   Once inside the marrow cavity, a sample of bone marrow is sucked out (aspirated) for pathology slides.   The material collected for bone marrow slides is processed immediately by a technologist.   The technician selects the marrow particles to make the slides for  pathology.   The marrow aspiration needle is removed. Then pressure is applied to the site with gauze until bleeding has stopped.  Following an aspiration, a bone marrow biopsy may be performed as well. The technique for this is very similar. A dressing is then applied.  RISKS AND COMPLICATIONS  The main complications of a bone marrow aspiration and biopsy include infection and bleeding.   Complications are uncommon. The procedure may not be performed in patients with bleeding tendencies.   A very rare complication from the procedure is injury to the heart during a breastbone (sternal) marrow aspiration. Only bone marrow aspirations are performed in this area.   Long-lasting pain at the site of the bone marrow aspiration and biopsy is uncommon.  Your caregiver will let you know when you are to get your results and will discuss them with you. You may make an appointment with your caregiver to find out the results. Do not assume everything is normal if you have not heard from your caregiver or the medical facility. It is important for you to follow up on all of your test results. Document Released: 01/31/2004 Document Revised: 01/16/2011 Document Reviewed: 01/25/2008 [facility] Patient Information 2012 [place].  2. Ultrasound of abdomen to evaluate the liver and the kidneys for any tumors.

## 2011-06-24 NOTE — Progress Notes (Signed)
OFFICE PROGRESS NOTE  CC  [name], DO, DO [address] [place] [address]  DIAGNOSIS: 61 year old female with chronic anemia unclear etiology  PRIOR THERAPY: undergoing workup  CURRENT THERAPY:Undergoing workup  INTERVAL HISTORY: [name] 61 y.o. female returns for Visit today. She has a CBC done that showed normal white count normal platelets but her hemoglobin is only 10.1. On her last visit we did iron studies including ferritin they were all normal. Her B12 folate levels were also normal. An erythropoietin level we'll also drawn and that was highly clear patient seems to be doing about the same she still is quite shaky when she walks. She does not complain of having any shortness of breath she has no dominant no pain no bleeding problems. Remainder of the review of systems is negative.  MEDICAL HISTORY: Past Medical History  Diagnosis Date  . Depression   . GERD (gastroesophageal reflux disease)     Followed by [name] // small bowel capsule endoscopy (11/2009) - minimal chronic inflammation, otherwise negative for  H.pylori, metaplasia, dysplasia  . Hypertension   . Hyperlipidemia   . Diabetes mellitus type II, uncontrolled     on insulin therapy  . COPD (chronic obstructive pulmonary disease) 2008    started home O2 (12/2010) // Prior significant smoker  // her CT of the chest there was no evidence of primary carcinoid tumor , scattered normal-sized mediastinal and hilar as well as axillary lymph nodes noted., no significant lymphadenopathy. COPD and emphysema with no acute cardiopulmonary disease noted , mild cardiomegaly and solitary calcified  plaque  . Moderate to severe pulmonary hypertension     severe per TEE (08/2010) - Peak RV-RA gradient 70m Hg  . Mitral regurgitation     mild to moderate per TEE (08/2010)  . Shingles   . Anemia     Anemia panel (08/2009) - iron 21, TIBC 441, UIBC 420,  ferritin 11 // Pt has been unable to  tolerate iron supplementation due to chronic abdominal discomfort, source of which still is not clear. // Repeat EGD (05/2011) negative, colonoscopy (05/2011) showing only int hemorrhoids, no specific cause of anemia  . History of colonic polyps     Colonoscopy (10/2005) - colon polyp with path nonadenomatous, no malignancy - by Dr. [name] // Colonoscopy (02/2002) - 2 polyps noted, 1 - adenomatous, 2 - hyperplastic // Followed by Dr. [name]// Repeat Colonoscopy (05/2011) - single tubular adenoma polyp - performed by Dr. [name] . Anxiety   . IBS (irritable bowel syndrome)   . Osteoporosis     DEXA (12/2004) - L-spine T score -2.6, Left hip -0.1  . Fibromyalgia   . Obstructive sleep apnea     on CPAP. // Sleep study (06/2009) - Moderate sleep apnea/ hypopnea syndrome , AHI 17.8 per hour with nonpositional hypopneas. CPAP titration to 12 CWP, AHI 2.4 per hour. Small resMed Quattro full-face mask with heated humidifier  . Restless leg syndrome   . Mitral stenosis     moderate per TEE (08/2010) // Mitral valve replacement with a 27-mm pericardial porcine valve (Medtronic Mosaic valve, serial ##[id number] on 09/20/2010 - PIvin Poot// [facility] [name] . Bilateral carotid artery stenosis     s/p right endarterectomy (06/2010). // Carotid UKorea(07/2010) -  60%-79% ICA stenosis, mid range of scale by velocity.  Left: Moderate-to-severe calcific and non-calcific plaque origin andproximal ICA and ECA.  60%-79% ICA stenosis, highest end of scale //  Followed by [name].  . Peripheral vascular disease   . Coronary artery disease   . Internal hemorrhoids     small internal hemorrhoids noted on colonoscopy 05/2011    ALLERGIES:  is allergic to lorazepam; morphine; percocet; and tramadol hcl.  MEDICATIONS:  Current Outpatient Prescriptions  Medication Sig Dispense Refill  . ACCU-CHEK FASTCLIX LANCETS MISC 1 each by Does not apply route 3 (three) times daily before  meals. Dx code- 250.00  102 each  12  . albuterol (PROVENTIL HFA;VENTOLIN HFA) 108 (90 BASE) MCG/ACT inhaler Inhale 2 puffs into the lungs every 6 (six) hours as needed. For breathing      . ALPRAZolam (XANAX) 1 MG tablet Take 1 tablet (1 mg total) by mouth 3 (three) times daily as needed for sleep or anxiety.  90 tablet  5  . amiodarone (PACERONE) 200 MG tablet Take 1 tablet (200 mg total) by mouth daily.  30 tablet  12  . aspirin 81 MG tablet Take 81 mg by mouth daily.        Marland Kitchen buPROPion (WELLBUTRIN XL) 150 MG 24 hr tablet Take 1 tablet (150 mg total) by mouth at bedtime.  30 tablet  5  . DULoxetine (CYMBALTA) 30 MG capsule Take 3 tablets by mouth once daily  90 capsule  7  . Elastic Bandages & Supports (TRUFORM STOCKINGS 20-30MMHG) MISC Wear compression stockings throughout the day, and take off at nighttime.  2 each  1  . ferrous gluconate (FERGON) 324 MG tablet Take 1 tablet (324 mg total) by mouth 2 (two) times daily with a meal.  120 tablet  3  . Fluticasone-Salmeterol (ADVAIR DISKUS) 250-50 MCG/DOSE AEPB Inhale 1 puff into the lungs every 12 (twelve) hours.  1 each  11  . furosemide (LASIX) 80 MG tablet Take 1 tablet (80 mg total) by mouth 2 (two) times daily.  60 tablet  12  . gabapentin (NEURONTIN) 300 MG capsule Take 1 capsule (300 mg total) by mouth 2 (two) times daily.  60 capsule  5  . glucose blood test strip Use to test blood sugar 3 times daily.  100 each  11  . HYDROcodone-acetaminophen (NORCO) 5-325 MG per tablet 1-2 tablets every 6 hours as needed for pain. Do not take with alcohol, do no drive or operate heavy machinery if drowsiness occurs. Do not take more than instructed.  240 tablet  3  . insulin aspart (NOVOLOG) 100 UNIT/ML injection Inject 5 Units into the skin 3 (three) times daily before meals.      . insulin detemir (LEVEMIR) 100 UNIT/ML injection Inject 50 Units into the skin at bedtime. DISPENSE 2 VIALS/ MONTH      . Insulin Pen Needle 31G X 5 MM MISC Please check  blood sugars 4 times daily. Before breakfast and all meals.  100 each  11  . Insulin Syringe-Needle U-100 (INSULIN SYRINGE .3CC/31GX5/16") 31G X 5/16" 0.3 ML MISC 1 Syringe by Does not apply route 2 (two) times daily at 10 AM and 5 PM.  100 each  11  . Insulin Syringe-Needle U-100 (INSULIN SYRINGE 1CC/30GX1/2") 30G X 1/2" 1 ML MISC 1 Container by Does not apply route 2 (two) times daily.  100 each  11  . Lancets Misc. (ACCU-CHEK FASTCLIX LANCET) KIT 1 each by Does not apply route 3 (three) times daily.  1 kit  0  . loratadine (CLARITIN) 10 MG tablet Take 1 tablet (10 mg total) by mouth daily.  90 tablet  3  .  omeprazole (PRILOSEC) 40 MG capsule Take 40 mg by mouth 2 (two) times daily.       . potassium chloride SA (K-DUR,KLOR-CON) 20 MEQ tablet Take 1 tablet (20 mEq total) by mouth 3 (three) times daily.  90 tablet  12  . rosuvastatin (CRESTOR) 20 MG tablet Take 1 tablet (20 mg total) by mouth daily.  30 tablet  7  . sennosides-docusate sodium (SENOKOT-S) 8.6-50 MG tablet Take 2 tablets by mouth daily. Hold for loose stools  30 tablet  5  . tiotropium (SPIRIVA) 18 MCG inhalation capsule Place 18 mcg into inhaler and inhale daily.      Marland Kitchen warfarin (COUMADIN) 2 MG tablet Take 2 mg by mouth daily.        SURGICAL HISTORY:  Past Surgical History  Procedure Date  . Orif clavicle fracture 01/2004    by Thana Farr. [name], M.D for Right clavicle nonunion.  . Tonsillectomy   . Carotid endarterectomy 07/04/2010    right, by [name] for asymptomatic right carotid artery stenosis  . Lipoma exision 08/2005    occipital lipoma 1.5cm - by Dr. [name]  . Hysteroscopy with endometrial ablation 06/2001    for persistent post-menopausal bleeding // by S. [name], M.D.  . Mitral valve replacement 09/20/10     with a 27-mm pericardial porcine valve (Medtronic Mosaic valve, serial #[id number]). 09/20/10, Dr Prescott Gum  . Left atrial maze procedure 09/20/10    for paroxysmal atrial fibrillation (Dr. Prescott Gum)    . Placement of left chest tube. 09/24/2010    Dr Prescott Gum  . Pr vein bypass graft,aorto-fem-pop May 2012  . Cardiac valve replacement Aug. 2012  . Colonoscopy 05/12/2011    performed by Dr. Michail Sermon. Showing small internal hemorrhoids, single tubular adenoma polyp  . Esophagogastroduodenoscopy 05/12/2011    performed by Dr. Michail Sermon. Negative for ulcerations, biopsy negative for evidence of celiac sprue    REVIEW OF SYSTEMS:  Pertinent items are noted in HPI.   PHYSICAL EXAMINATION: not performed  ECOG PERFORMANCE STATUS: 2 - Symptomatic, <50% confined to bed  Blood pressure 147/70, pulse 81, temperature 98.3 F (36.8 C), height 5' 2" (1.575 m), weight 185 lb 6.4 oz (84.097 kg).  LABORATORY DATA: Lab Results  Component Value Date   WBC 8.4 06/24/2011   HGB 10.1* 06/24/2011   HCT 31.8* 06/24/2011   MCV 88.4 06/24/2011   PLT 213 06/24/2011      Chemistry      Component Value Date/Time   NA 141 06/16/2011 1430   K 3.8 06/16/2011 1430   CL 99 06/16/2011 1430   CO2 32 06/16/2011 1430   BUN 22 06/16/2011 1430   CREATININE 1.06 06/16/2011 1430   CREATININE 0.90 07/24/2010 0926      Component Value Date/Time   CALCIUM 9.1 06/16/2011 1430   ALKPHOS 75 06/16/2011 1430   AST 21 06/16/2011 1430   ALT 18 06/16/2011 1430   BILITOT 0.3 06/16/2011 1430       RADIOGRAPHIC STUDIES:  No results found.  ASSESSMENT: 61 year old female with anemia unclear etiology. She certainly does not have iron deficiency or B12 or folate deficiency. She does have an elevated April level. My concern is that she may have a primary bone marrow problem. Also we do need to rule out any kind of a underlying malignancy that may be giving her rise to an elevated Erythropoeitin level to his renal cancer. I have discussed this  with the patient and her sister who accompanies her  today.   PLAN: 1 I will set the patient up for a bone marrow biopsy and aspirate by interventional radiology. Risks and benefits of bone marrow were  discussed with them. Rationale for doing a bone marrow were discussed with the patient as well.  #2 we'll also set her up for an ultrasound of the abdomen looking at specifically the kidneys to make sure there are no masses such as renal cell carcinoma well to look at the liver as well.  #3 patient will be seen back in one month's time.   All questions were answered. The patient knows to call the clinic with any problems, questions or concerns. We can certainly see the patient much sooner if necessary.  I spent 20 minutes counseling the patient face to face. The total time spent in the appointment was 30 minutes.    Marcy Panning, MD Medical/Oncology [facility] [phone] (beeper) [phone] (Office)  06/24/2011, 10:17 AM

## 2011-06-24 NOTE — Assessment & Plan Note (Signed)
Continue aspirin and statin. Followed by vascular surgery. 

## 2011-06-24 NOTE — Telephone Encounter (Signed)
Mailed the pt her June 2013 appt calendar along with the bone marrow biopsy material. Pt is aware of her US abdomen and the bone marrow biopsy appts.

## 2011-06-24 NOTE — Telephone Encounter (Signed)
Pt called [facility] asking for oxygen to carry outside, she needs something small.  She needs an order for [facility] to go to house and do a best fit evaluation.  I have the form for you to sign and date.  It's in your box.

## 2011-06-24 NOTE — Patient Instructions (Signed)
Your physician recommends that you schedule a follow-up appointment in: 3 MONTHS  STOP WARFARIN  STOP AMIODARONE

## 2011-07-01 ENCOUNTER — Other Ambulatory Visit: Payer: Self-pay | Admitting: Dietician

## 2011-07-01 ENCOUNTER — Ambulatory Visit (HOSPITAL_COMMUNITY)
Admission: RE | Admit: 2011-07-01 | Discharge: 2011-07-01 | Disposition: A | Discharge status: Home / Self Care | Payer: Medicare Other | Source: Ambulatory Visit | Attending: Oncology | Admitting: Oncology

## 2011-07-01 DIAGNOSIS — R160 Hepatomegaly, not elsewhere classified: Secondary | ICD-10-CM | POA: Insufficient documentation

## 2011-07-01 DIAGNOSIS — E119 Type 2 diabetes mellitus without complications: Secondary | ICD-10-CM

## 2011-07-01 DIAGNOSIS — N289 Disorder of kidney and ureter, unspecified: Secondary | ICD-10-CM | POA: Insufficient documentation

## 2011-07-01 DIAGNOSIS — D649 Anemia, unspecified: Secondary | ICD-10-CM

## 2011-07-01 NOTE — Telephone Encounter (Signed)
Patient requests change to Novolog pens because her dose is small and new meter that she can see. Gave patient a Contour USB meter with 100 strips and entered new Contour strip Rx to be sent to [facility].

## 2011-07-02 ENCOUNTER — Other Ambulatory Visit: Payer: Self-pay | Admitting: Oncology

## 2011-07-02 ENCOUNTER — Telehealth: Payer: Self-pay | Admitting: *Deleted

## 2011-07-02 DIAGNOSIS — N2889 Other specified disorders of kidney and ureter: Secondary | ICD-10-CM

## 2011-07-02 NOTE — Telephone Encounter (Signed)
gave patient appointment for mri of the adbomen on 07-10-2011 at 8:00am patient confirmed over the phone the new date and time of the appointments

## 2011-07-03 ENCOUNTER — Telehealth: Payer: Self-pay | Admitting: *Deleted

## 2011-07-03 NOTE — Telephone Encounter (Signed)
Message from scheduler on desk. Pt has concern about taking baby aspirin. Pt was told to come off coumadin, but would like to know if she should stop taking baby aspirin.. Reviewed with Dr. Humphrey Rolls. Per MD, notified pt to stop aspirin 81 mg dialy as she will have a bone marrow biopsy on 5/28. Pt verbalized understanding

## 2011-07-04 ENCOUNTER — Other Ambulatory Visit: Payer: Self-pay | Admitting: Physician Assistant

## 2011-07-07 ENCOUNTER — Other Ambulatory Visit: Payer: Self-pay | Admitting: Radiology

## 2011-07-08 ENCOUNTER — Ambulatory Visit (HOSPITAL_COMMUNITY)
Admission: RE | Admit: 2011-07-08 | Discharge: 2011-07-08 | Disposition: A | Discharge status: Home / Self Care | Payer: Medicare Other | Source: Ambulatory Visit | Attending: Oncology | Admitting: Oncology

## 2011-07-08 ENCOUNTER — Encounter (HOSPITAL_COMMUNITY): Payer: Self-pay

## 2011-07-08 DIAGNOSIS — E785 Hyperlipidemia, unspecified: Secondary | ICD-10-CM | POA: Insufficient documentation

## 2011-07-08 DIAGNOSIS — J449 Chronic obstructive pulmonary disease, unspecified: Secondary | ICD-10-CM | POA: Insufficient documentation

## 2011-07-08 DIAGNOSIS — K219 Gastro-esophageal reflux disease without esophagitis: Secondary | ICD-10-CM | POA: Insufficient documentation

## 2011-07-08 DIAGNOSIS — E119 Type 2 diabetes mellitus without complications: Secondary | ICD-10-CM | POA: Insufficient documentation

## 2011-07-08 DIAGNOSIS — J4489 Other specified chronic obstructive pulmonary disease: Secondary | ICD-10-CM | POA: Insufficient documentation

## 2011-07-08 DIAGNOSIS — Z794 Long term (current) use of insulin: Secondary | ICD-10-CM | POA: Insufficient documentation

## 2011-07-08 DIAGNOSIS — I10 Essential (primary) hypertension: Secondary | ICD-10-CM | POA: Insufficient documentation

## 2011-07-08 DIAGNOSIS — Z79899 Other long term (current) drug therapy: Secondary | ICD-10-CM | POA: Insufficient documentation

## 2011-07-08 DIAGNOSIS — D649 Anemia, unspecified: Secondary | ICD-10-CM

## 2011-07-08 LAB — GLUCOSE, CAPILLARY: Glucose-Capillary: 156 mg/dL — ABNORMAL HIGH (ref 70–99)

## 2011-07-08 LAB — CBC
HCT: 34.9 % — ABNORMAL LOW (ref 36.0–46.0)
Hemoglobin: 11.2 g/dL — ABNORMAL LOW (ref 12.0–15.0)
MCH: 29.3 pg (ref 26.0–34.0)
MCHC: 32.1 g/dL (ref 30.0–36.0)
MCV: 91.4 fL (ref 78.0–100.0)
Platelets: 204 10*3/uL (ref 150–400)
RBC: 3.82 MIL/uL — ABNORMAL LOW (ref 3.87–5.11)
RDW: 18.9 % — ABNORMAL HIGH (ref 11.5–15.5)
WBC: 5.7 10*3/uL (ref 4.0–10.5)

## 2011-07-08 LAB — PROTIME-INR
INR: 1.01 (ref 0.00–1.49)
Prothrombin Time: 13.5 seconds (ref 11.6–15.2)

## 2011-07-08 LAB — APTT: aPTT: 29 seconds (ref 24–37)

## 2011-07-08 MED ORDER — SODIUM CHLORIDE 0.9 % IV SOLN: INTRAVENOUS | Status: DC

## 2011-07-08 MED ORDER — MIDAZOLAM HCL 5 MG/5ML IJ SOLN
INTRAMUSCULAR | Status: AC | PRN
  Administered 2011-07-08: 2 mg via INTRAVENOUS

## 2011-07-08 MED ORDER — MIDAZOLAM HCL 2 MG/2ML IJ SOLN
INTRAMUSCULAR | Status: AC
  Filled 2011-07-08: qty 4

## 2011-07-08 MED ORDER — FENTANYL CITRATE 0.05 MG/ML IJ SOLN
INTRAMUSCULAR | Status: AC
  Filled 2011-07-08: qty 6

## 2011-07-08 MED ORDER — FENTANYL CITRATE 0.05 MG/ML IJ SOLN
INTRAMUSCULAR | Status: AC | PRN
  Administered 2011-07-08: 50 ug via INTRAVENOUS
  Administered 2011-07-08: 150 ug via INTRAVENOUS

## 2011-07-08 NOTE — H&P (Signed)
[name] is an 61 y.o. female.   Chief Complaint: " I'm here for a bone marrow biopsy" HPI: Patient with anemia of unknown etiology presents today for CT guided bone marrow biopsy.  Past Medical History  Diagnosis Date  . Depression   . GERD (gastroesophageal reflux disease)     Followed by [name] // small bowel capsule endoscopy (11/2009) - minimal chronic inflammation, otherwise negative for  H.pylori, metaplasia, dysplasia  . Hypertension   . Hyperlipidemia   . Diabetes mellitus type II, uncontrolled     on insulin therapy  . COPD (chronic obstructive pulmonary disease) 2008    started home O2 (12/2010) // Prior significant smoker  // her CT of the chest there was no evidence of primary carcinoid tumor , scattered normal-sized mediastinal and hilar as well as axillary lymph nodes noted., no significant lymphadenopathy. COPD and emphysema with no acute cardiopulmonary disease noted , mild cardiomegaly and solitary calcified  plaque  . Moderate to severe pulmonary hypertension     severe per TEE (08/2010) - Peak RV-RA gradient 28m Hg  . Mitral regurgitation     mild to moderate per TEE (08/2010)  . Shingles   . Anemia     Anemia panel (08/2009) - iron 21, TIBC 441, UIBC 420,  ferritin 11 // Pt has been unable to tolerate iron supplementation due to chronic abdominal discomfort, source of which still is not clear. // Repeat EGD (05/2011) negative, colonoscopy (05/2011) showing only int hemorrhoids, no specific cause of anemia  . History of colonic polyps     Colonoscopy (10/2005) - colon polyp with path nonadenomatous, no malignancy - by Dr. [name] // Colonoscopy (02/2002) - 2 polyps noted, 1 - adenomatous, 2 - hyperplastic // Followed by Dr. [name]// Repeat Colonoscopy (05/2011) - single tubular adenoma polyp - performed by Dr. [name] . Anxiety   . IBS (irritable bowel syndrome)   . Osteoporosis     DEXA (12/2004) - L-spine T score -2.6, Left hip -0.1  . Fibromyalgia   .  Obstructive sleep apnea     on CPAP. // Sleep study (06/2009) - Moderate sleep apnea/ hypopnea syndrome , AHI 17.8 per hour with nonpositional hypopneas. CPAP titration to 12 CWP, AHI 2.4 per hour. Small resMed Quattro full-face mask with heated humidifier  . Restless leg syndrome   . Mitral stenosis     moderate per TEE (08/2010) // Mitral valve replacement with a 27-mm pericardial porcine valve (Medtronic Mosaic valve, serial ##[id number] on 09/20/2010 - PIvin Poot// [name] . Bilateral carotid artery stenosis     s/p right endarterectomy (06/2010). // Carotid [place]orea(07/2010) -  60%-79% ICA stenosis, mid range of scale by velocity.  Left: Moderate-to-severe calcific and non-calcific plaque origin andproximal ICA and ECA.  60%-79% ICA stenosis, highest end of scale // Followed by [name]  . Peripheral vascular disease   . Coronary artery disease   . Internal hemorrhoids     small internal hemorrhoids noted on colonoscopy 05/2011  . Diabetes mellitus     Past Surgical History  Procedure Date  . Orif clavicle fracture 01/2004    by MThana Farr YLorin Mercy M.D for Right clavicle nonunion.  . Tonsillectomy   . Carotid endarterectomy 07/04/2010    right, by Dr. BTrula Sladefor asymptomatic right carotid artery stenosis  . Lipoma exision 08/2005    occipital lipoma 1.5cm - by Dr. LRebekah Chesterfield . Hysteroscopy with endometrial ablation 06/2001  for persistent post-menopausal bleeding // by S. [name], M.D.  . Mitral valve replacement 09/20/10     with a 27-mm pericardial porcine valve (Medtronic Mosaic valve, serial #[id number]). 09/20/10, Dr Prescott Gum  . Left atrial maze procedure 09/20/10    for paroxysmal atrial fibrillation (Dr. Prescott Gum)  . Placement of left chest tube. 09/24/2010    Dr Prescott Gum  . Pr vein bypass graft,aorto-fem-pop May 2012  . Cardiac valve replacement Aug. 2012  . Colonoscopy 05/12/2011    performed by Dr. Michail Sermon. Showing small  internal hemorrhoids, single tubular adenoma polyp  . Esophagogastroduodenoscopy 05/12/2011    performed by Dr. Michail Sermon. Negative for ulcerations, biopsy negative for evidence of celiac sprue    Family History  Problem Relation Age of Onset  . Heart disease Father 18    died to MI at 47yo  . Heart disease Brother 84    died of MI at 49yo   Social History:  reports that she quit smoking about a year ago. Her smoking use included Cigarettes. She has a 50 pack-year smoking history. She does not have any smokeless tobacco history on file. She reports that she does not drink alcohol or use illicit drugs.  Allergies:  Allergies  Allergen Reactions  . Lorazepam     Patient's sister noted that ativan caused the patient to become extremely confused during hospitalization 09/2010  . Morphine     REACTION: Unknown reaction  . Percocet (Oxycodone-Acetaminophen)     headache  . Tramadol Hcl     REACTION: swelling    Current outpatient prescriptions:ACCU-CHEK FASTCLIX LANCETS MISC, 1 each by Does not apply route 3 (three) times daily before meals. Dx code- 250.00, Disp: 102 each, Rfl: 12;  acetaminophen (TYLENOL) 500 MG tablet, Take 1,000 mg by mouth every 6 (six) hours as needed. For pain, Disp: , Rfl:  albuterol (PROVENTIL HFA;VENTOLIN HFA) 108 (90 BASE) MCG/ACT inhaler, Inhale 2 puffs into the lungs every 6 (six) hours as needed. For breathing, Disp: , Rfl: ;  ALPRAZolam (XANAX) 1 MG tablet, Take 1 tablet (1 mg total) by mouth 3 (three) times daily as needed for sleep or anxiety., Disp: 90 tablet, Rfl: 5;  aspirin 81 MG tablet, Take 81 mg by mouth daily.  , Disp: , Rfl:  buPROPion (WELLBUTRIN XL) 150 MG 24 hr tablet, Take 1 tablet (150 mg total) by mouth at bedtime., Disp: 30 tablet, Rfl: 5;  docusate sodium (COLACE) 50 MG capsule, Take by mouth every other day., Disp: , Rfl: ;  DULoxetine (CYMBALTA) 30 MG capsule, Take 3 tablets by mouth once daily, Disp: 90 capsule, Rfl: 7 Elastic Bandages &  Supports (TRUFORM STOCKINGS 20-30MMHG) MISC, Wear compression stockings throughout the day, and take off at nighttime., Disp: 2 each, Rfl: 1;  ferrous gluconate (FERGON) 324 MG tablet, Take 1 tablet (324 mg total) by mouth 2 (two) times daily with a meal., Disp: 120 tablet, Rfl: 3;  Fluticasone-Salmeterol (ADVAIR DISKUS) 250-50 MCG/DOSE AEPB, Inhale 1 puff into the lungs every 12 (twelve) hours., Disp: 1 each, Rfl: 11 furosemide (LASIX) 80 MG tablet, Take 1 tablet (80 mg total) by mouth 2 (two) times daily., Disp: 60 tablet, Rfl: 12;  gabapentin (NEURONTIN) 300 MG capsule, Take 1 capsule (300 mg total) by mouth 2 (two) times daily., Disp: 60 capsule, Rfl: 5 HYDROcodone-acetaminophen (NORCO) 5-325 MG per tablet, 1-2 tablets every 6 hours as needed for pain. Do not take with alcohol, do no drive or operate heavy machinery if drowsiness occurs.  Do not take more than instructed., Disp: 240 tablet, Rfl: 3;  insulin aspart (NOVOLOG) 100 UNIT/ML injection, Inject 9-19 Units into the skin 3 (three) times daily before meals. ( Sliding Scale ), Disp: , Rfl:  insulin detemir (LEVEMIR) 100 UNIT/ML injection, Inject 50 Units into the skin at bedtime. DISPENSE 2 VIALS/ MONTH, Disp: , Rfl: ;  Insulin Syringe-Needle U-100 (INSULIN SYRINGE .3CC/31GX5/16") 31G X 5/16" 0.3 ML MISC, 1 Syringe by Does not apply route 2 (two) times daily at 10 AM and 5 PM., Disp: 100 each, Rfl: 11 Insulin Syringe-Needle U-100 (INSULIN SYRINGE 1CC/30GX1/2") 30G X 1/2" 1 ML MISC, 1 Container by Does not apply route 2 (two) times daily., Disp: 100 each, Rfl: 11;  Lancets Misc. (ACCU-CHEK FASTCLIX LANCET) KIT, 1 each by Does not apply route 3 (three) times daily., Disp: 1 kit, Rfl: 0;  loratadine (CLARITIN) 10 MG tablet, Take 1 tablet (10 mg total) by mouth daily., Disp: 90 tablet, Rfl: 3 omeprazole (PRILOSEC) 40 MG capsule, Take 40 mg by mouth 2 (two) times daily. , Disp: , Rfl: ;  potassium chloride SA (K-DUR,KLOR-CON) 20 MEQ tablet, Take 1 tablet (20  mEq total) by mouth 3 (three) times daily., Disp: 90 tablet, Rfl: 12;  rosuvastatin (CRESTOR) 20 MG tablet, Take 1 tablet (20 mg total) by mouth daily., Disp: 30 tablet, Rfl: 7 sennosides-docusate sodium (SENOKOT-S) 8.6-50 MG tablet, Take 2 tablets by mouth daily. Hold for loose stools, Disp: 30 tablet, Rfl: 5;  tiotropium (SPIRIVA) 18 MCG inhalation capsule, Place 18 mcg into inhaler and inhale daily., Disp: , Rfl: ;  DISCONTD: glucose blood test strip, Use to test blood sugar 3 times daily., Disp: 100 each, Rfl: 11 Current facility-administered medications:0.9 %  sodium chloride infusion, , Intravenous, Continuous, Art A Hoss, MD;  fentaNYL (SUBLIMAZE) 0.05 MG/ML injection, , , , ;  midazolam (VERSED) 2 MG/2ML injection, , , ,    Results for orders placed during the hospital encounter of 07/08/11 (from the past 48 hour(s))  CBC     Status: Abnormal   Collection Time   07/08/11  8:16 AM      Component Value Range Comment   WBC 5.7  4.0 - 10.5 (K/uL)    RBC 3.82 (*) 3.87 - 5.11 (MIL/uL)    Hemoglobin 11.2 (*) 12.0 - 15.0 (g/dL)    HCT 34.9 (*) 36.0 - 46.0 (%)    MCV 91.4  78.0 - 100.0 (fL)    MCH 29.3  26.0 - 34.0 (pg)    MCHC 32.1  30.0 - 36.0 (g/dL)    RDW 18.9 (*) 11.5 - 15.5 (%)    Platelets 204  150 - 400 (K/uL)    Results for orders placed during the hospital encounter of 07/08/11  APTT      Component Value Range   aPTT 29  24 - 37 (seconds)  CBC      Component Value Range   WBC 5.7  4.0 - 10.5 (K/uL)   RBC 3.82 (*) 3.87 - 5.11 (MIL/uL)   Hemoglobin 11.2 (*) 12.0 - 15.0 (g/dL)   HCT 34.9 (*) 36.0 - 46.0 (%)   MCV 91.4  78.0 - 100.0 (fL)   MCH 29.3  26.0 - 34.0 (pg)   MCHC 32.1  30.0 - 36.0 (g/dL)   RDW 18.9 (*) 11.5 - 15.5 (%)   Platelets 204  150 - 400 (K/uL)  PROTIME-INR      Component Value Range   Prothrombin Time 13.5  11.6 - 15.2 (  seconds)   INR 1.01  0.00 - 1.49     Review of Systems  Constitutional: Negative for fever and chills.  Respiratory: Positive for  shortness of breath. Negative for cough.   Cardiovascular: Negative for chest pain.  Gastrointestinal: Positive for nausea. Negative for vomiting.       Occ abd pain  Musculoskeletal:       Occ back pain  Neurological: Positive for headaches.  Endo/Heme/Allergies: Does not bruise/bleed easily.    Blood pressure 126/88, pulse 72, temperature 97.2 F (36.2 C), temperature source Oral, resp. rate 19, height _0  (1.575 m), weight 186 lb (84.369 kg), SpO2 100.00%. Physical Exam  Constitutional: She is oriented to person, place, and time. She appears well-developed and well-nourished.  Cardiovascular: Normal rate and regular rhythm.   Respiratory: Effort normal.       Distant BS; few bibasilar crackles  GI: Soft. Bowel sounds are normal.  Musculoskeletal: Normal range of motion. She exhibits edema.  Neurological: She is alert and oriented to person, place, and time.     Assessment/Plan Patient with anemia of unknown etiology; plan is for CT guided bone marrow biopsy. Details/risks of procedure d/w pt/sister with their understanding and consent.  Jodie Cavey,D KEVIN 07/08/2011, 8:53 AM

## 2011-07-08 NOTE — Progress Notes (Signed)
Pt sitting on side of bed without complaints.  VS stable.  Left iliac dressing is unchanged.  Pt discharged to home.

## 2011-07-08 NOTE — Discharge Instructions (Signed)
Bone Marrow Aspiration and Bone Biopsy Examination of the bone marrow is a valuable test to diagnose blood disorders. A bone marrow biopsy takes a sample of bone and a small amount of fluid and cells from inside the bone. A bone marrow aspiration removes only the marrow. Bone marrow aspiration and bone biopsies are used to stage different disorders of the blood, such as leukemia. Staging will help your caregiver understand how far the disease has progressed.  The tests are also useful in diagnosing:  Fever of unknown origin (FUO).   Bacterial infections and other widespread fungal infections.   Cancers that have spread (metastasized) to the bone marrow.   Diseases that are characterized by a deficiency of an enzyme (storage diseases). This includes:   Niemann-Pick disease.   Gaucher disease.  PROCEDURE  Sites used to get samples include:   Back of your hip bone (posterior iliac crest).   Both aspiration and biopsy.   Front of your hip bone (anterior iliac crest).   Both aspiration and biopsy.   Breastbone (sternum).   Aspiration from your breastbone (done only in adults). This method is rarely used.  When you get a hip bone aspiration:  You are placed lying on your side with the upper knee brought up and flexed with the lower leg straight.   The site is prepared, cleaned with an antiseptic scrub, and draped. This keeps the biopsy area clean.   The skin and the area down to the lining of the bone (periosteum) are made numb with a local anesthetic.   The bone marrow aspiration needle is inserted. You will feel pressure on your bone.   Once inside the marrow cavity, a sample of bone marrow is sucked out (aspirated) for pathology slides.   The material collected for bone marrow slides is processed immediately by a technologist.   The technician selects the marrow particles to make the slides for pathology.   The marrow aspiration needle is removed. Then pressure is applied to  the site with gauze until bleeding has stopped.  Following an aspiration, a bone marrow biopsy may be performed as well. The technique for this is very similar. A dressing is then applied.  RISKS AND COMPLICATIONS  The main complications of a bone marrow aspiration and biopsy include infection and bleeding.   Complications are uncommon. The procedure may not be performed in patients with bleeding tendencies.   A very rare complication from the procedure is injury to the heart during a breastbone (sternal) marrow aspiration. Only bone marrow aspirations are performed in this area.   Long-lasting pain at the site of the bone marrow aspiration and biopsy is uncommon.  Your caregiver will let you know when you are to get your results and will discuss them with you. You may make an appointment with your caregiver to find out the results. Do not assume everything is normal if you have not heard from your caregiver or the medical facility. It is important for you to follow up on all of your test results. Document Released: 01/31/2004 Document Revised: 01/16/2011 Document Reviewed: 01/25/2008 [facility] Patient Information 2012 [employer].Bone Marrow Aspiration, Bone Marrow Biopsy Care After Read the instructions outlined below and refer to this sheet in the next few weeks. These discharge instructions provide you with general information on caring for yourself after you leave the hospital. Your caregiver may also give you specific instructions. While your treatment has been planned according to the most current medical practices available, unavoidable complications  occasionally occur. If you have any problems or questions after discharge, call your caregiver. FINDING OUT THE RESULTS OF YOUR TEST Not all test results are available during your visit. If your test results are not back during the visit, make an appointment with your caregiver to find out the results. Do not assume everything is normal if  you have not heard from your caregiver or the medical facility. It is important for you to follow up on all of your test results.  HOME CARE INSTRUCTIONS  You have had sedation and may be sleepy or dizzy. Your thinking may not be as clear as usual. For the next 24 hours:  Only take over-the-counter or prescription medicines for pain, discomfort, and or fever as directed by your caregiver.   Do not drink alcohol.   Do not smoke.   Do not drive.   Do not make important legal decisions.   Do not operate heavy machinery.   Do not care for small children by yourself.   Keep your dressing clean and dry. You may replace dressing with a bandage after 24 hours.   You may take a bath or shower after 24 hours.   Use an ice pack for 20 minutes every 2 hours while awake for pain as needed.  SEEK MEDICAL CARE IF:   There is redness, swelling, or increasing pain at the biopsy site.   There is pus coming from the biopsy site.   There is drainage from a biopsy site lasting longer than one day.   An unexplained oral temperature above 102 F (38.9 C) develops.  SEEK IMMEDIATE MEDICAL CARE IF:   You develop a rash.   You have difficulty breathing.   You develop any reaction or side effects to medications given.  Document Released: 08/16/2004 Document Revised: 01/16/2011 Document Reviewed: 01/25/2008 [facility] Patient Information 2012 [place].

## 2011-07-08 NOTE — Progress Notes (Signed)
Pt back to room 21 via bed. Pt alert and oriented.  Pt has dressing noted on inner left iliac crest area.  Dressing has scant amount of seroussanguinous drainage noted otherwise dressing is intact.  Pt has no complaints.

## 2011-07-08 NOTE — Procedures (Signed)
BM Bx L iliac bone No comp

## 2011-07-10 ENCOUNTER — Ambulatory Visit (HOSPITAL_COMMUNITY)
Admission: RE | Admit: 2011-07-10 | Discharge: 2011-07-10 | Disposition: A | Discharge status: Home / Self Care | Payer: Medicare Other | Source: Ambulatory Visit | Attending: Oncology | Admitting: Oncology

## 2011-07-10 DIAGNOSIS — K7689 Other specified diseases of liver: Secondary | ICD-10-CM | POA: Insufficient documentation

## 2011-07-10 DIAGNOSIS — D4959 Neoplasm of unspecified behavior of other genitourinary organ: Secondary | ICD-10-CM | POA: Insufficient documentation

## 2011-07-10 DIAGNOSIS — E278 Other specified disorders of adrenal gland: Secondary | ICD-10-CM | POA: Insufficient documentation

## 2011-07-10 DIAGNOSIS — N2889 Other specified disorders of kidney and ureter: Secondary | ICD-10-CM

## 2011-07-10 DIAGNOSIS — I700 Atherosclerosis of aorta: Secondary | ICD-10-CM | POA: Insufficient documentation

## 2011-07-10 MED ORDER — INSULIN ASPART 100 UNIT/ML ~~LOC~~ SOLN: 9.0000 [IU] | Freq: Three times a day (TID) | SUBCUTANEOUS | Status: DC

## 2011-07-10 MED ORDER — GLUCOSE BLOOD VI STRP: ORAL_STRIP | Status: DC

## 2011-07-10 MED ORDER — GADOBENATE DIMEGLUMINE 529 MG/ML IV SOLN
20.0000 mL | Freq: Once | INTRAVENOUS | Status: AC | PRN
  Administered 2011-07-10: 17 mL via INTRAVENOUS

## 2011-07-10 NOTE — Telephone Encounter (Signed)
Addended by: Annamarie Dawley on: 07/10/2011 07:47 PM   Modules accepted: Orders

## 2011-07-11 ENCOUNTER — Telehealth: Payer: Self-pay | Admitting: *Deleted

## 2011-07-11 NOTE — Telephone Encounter (Signed)
Pt called concerned states" I was in there to have a BMBX and the guy in there said theres a spot on my kidney and that's why I'm having an MRI. It wasn't his place to say anything. I didn't know anything about it. I could tell he knew I didn't know about it and then he couldn't take it back. He said it may be a cyst or something and didn't say anything after that. I've fallen through the cracks before and I just don't want it to happen again." Discussed with pt she has not been contacted about her tests as MD currently out of the office until 6/6.  However, I will pass her concerns to MD on 6/6 upon her return and she will be contacted regarding her results.  Currently pt has F/U  MD visit on 06/21. Pt advised she was not aware of the appt on 6/21 and she is unable to come on Friday. Addressed pt's concern about appt date and this can be rescheduled to a day other than Friday.  Transferred pt to [place] in scheduling for assistance. Reviewed pt's concerns/results with Dr. Truddie Coco in Dr. Laurelyn Sickle absence.

## 2011-07-14 ENCOUNTER — Telehealth: Payer: Self-pay | Admitting: Oncology

## 2011-07-14 NOTE — Telephone Encounter (Signed)
S/w the pt and she is aware of her June 10th appt. Found the pt a cancellation for a sooner appt

## 2011-07-15 NOTE — Progress Notes (Signed)
Addended by: Janne Napoleon on: 07/15/2011 07:16 PM   Modules accepted: Orders

## 2011-07-16 ENCOUNTER — Other Ambulatory Visit: Payer: Self-pay | Admitting: Internal Medicine

## 2011-07-16 DIAGNOSIS — Z1231 Encounter for screening mammogram for malignant neoplasm of breast: Secondary | ICD-10-CM

## 2011-07-17 ENCOUNTER — Other Ambulatory Visit: Payer: Self-pay | Admitting: Medical Oncology

## 2011-07-17 NOTE — Telephone Encounter (Signed)
Please set patient up to see me on 07/18/11 at 2:00

## 2011-07-21 ENCOUNTER — Ambulatory Visit (HOSPITAL_BASED_OUTPATIENT_CLINIC_OR_DEPARTMENT_OTHER): Payer: Medicare Other | Admitting: Oncology

## 2011-07-21 ENCOUNTER — Encounter: Payer: Self-pay | Admitting: Oncology

## 2011-07-21 ENCOUNTER — Telehealth: Payer: Self-pay | Admitting: Oncology

## 2011-07-21 VITALS — BP 118/66 | HR 88 | Temp 97.7°F | Ht 62.0 in | Wt 188.1 lb

## 2011-07-21 DIAGNOSIS — C649 Malignant neoplasm of unspecified kidney, except renal pelvis: Secondary | ICD-10-CM | POA: Insufficient documentation

## 2011-07-21 DIAGNOSIS — N2889 Other specified disorders of kidney and ureter: Secondary | ICD-10-CM

## 2011-07-21 DIAGNOSIS — D649 Anemia, unspecified: Secondary | ICD-10-CM

## 2011-07-21 DIAGNOSIS — N289 Disorder of kidney and ureter, unspecified: Secondary | ICD-10-CM

## 2011-07-21 HISTORY — DX: Malignant neoplasm of unspecified kidney, except renal pelvis: C64.9

## 2011-07-21 NOTE — Progress Notes (Signed)
OFFICE PROGRESS NOTE  CC  [name], DO, DO [address] [place] [address]  DIAGNOSIS: 61 year old female with chronic anemia unclear etiology  PRIOR THERAPY: undergoing workup  CURRENT THERAPY:Undergoing workup  INTERVAL HISTORY: [name] 61 y.o. female returns for Visit today to discuss her MRI results. Unfortunately she does have a 3.0 cm mass in the lower pole of the left kidney. She is not experiencing any kind of pain she has no hematuria hematochezia. She has no nausea or vomiting she has no weight loss. She does wear oxygen chronically. MEDICAL HISTORY: Past Medical History  Diagnosis Date  . Depression   . GERD (gastroesophageal reflux disease)     Followed by [name] // small bowel capsule endoscopy (11/2009) - minimal chronic inflammation, otherwise negative for  H.pylori, metaplasia, dysplasia  . Hypertension   . Hyperlipidemia   . Diabetes mellitus type II, uncontrolled     on insulin therapy  . COPD (chronic obstructive pulmonary disease) 2008    started home O2 (12/2010) // Prior significant smoker  // her CT of the chest there was no evidence of primary carcinoid tumor , scattered normal-sized mediastinal and hilar as well as axillary lymph nodes noted., no significant lymphadenopathy. COPD and emphysema with no acute cardiopulmonary disease noted , mild cardiomegaly and solitary calcified  plaque  . Moderate to severe pulmonary hypertension     severe per TEE (08/2010) - Peak RV-RA gradient 38m Hg  . Mitral regurgitation     mild to moderate per TEE (08/2010)  . Shingles   . Anemia     Anemia panel (08/2009) - iron 21, TIBC 441, UIBC 420,  ferritin 11 // Pt has been unable to tolerate iron supplementation due to chronic abdominal discomfort, source of which still is not clear. // Repeat EGD (05/2011) negative, colonoscopy (05/2011) showing only int hemorrhoids, no specific cause of anemia  . History of colonic polyps    Colonoscopy (10/2005) - colon polyp with path nonadenomatous, no malignancy - by Dr. [name] // Colonoscopy (02/2002) - 2 polyps noted, 1 - adenomatous, 2 - hyperplastic // Followed by Dr. [name]// Repeat Colonoscopy (05/2011) - single tubular adenoma polyp - performed by Dr. [name] . Anxiety   . IBS (irritable bowel syndrome)   . Osteoporosis     DEXA (12/2004) - L-spine T score -2.6, Left hip -0.1  . Fibromyalgia   . Obstructive sleep apnea     on CPAP. // Sleep study (06/2009) - Moderate sleep apnea/ hypopnea syndrome , AHI 17.8 per hour with nonpositional hypopneas. CPAP titration to 12 CWP, AHI 2.4 per hour. Small resMed Quattro full-face mask with heated humidifier  . Restless leg syndrome   . Mitral stenosis     moderate per TEE (08/2010) // Mitral valve replacement with a 27-mm pericardial porcine valve (Medtronic Mosaic valve, serial ##[id number] on 09/20/2010 - PIvin Poot// [facility] [name] . Bilateral carotid artery stenosis     s/p right endarterectomy (06/2010). // Carotid [place](07/2010) -  60%-79% ICA stenosis, mid range of scale by velocity.  Left: Moderate-to-severe calcific and non-calcific plaque origin andproximal ICA and ECA.  60%-79% ICA stenosis, highest end of scale // Followed by [name]  . Peripheral vascular disease   . Coronary artery disease   . Internal hemorrhoids     small internal hemorrhoids noted on colonoscopy 05/2011  . Diabetes mellitus   . Renal mass, left 07/21/2011  ALLERGIES:  is allergic to lorazepam; morphine; percocet; and tramadol hcl.  MEDICATIONS:  Current Outpatient Prescriptions  Medication Sig Dispense Refill  . ACCU-CHEK FASTCLIX LANCETS MISC 1 each by Does not apply route 3 (three) times daily before meals. Dx code- 250.00  102 each  12  . acetaminophen (TYLENOL) 500 MG tablet Take 1,000 mg by mouth every 6 (six) hours as needed. For pain      . albuterol (PROVENTIL HFA;VENTOLIN HFA) 108 (90 BASE)  MCG/ACT inhaler Inhale 2 puffs into the lungs every 6 (six) hours as needed. For breathing      . ALPRAZolam (XANAX) 1 MG tablet Take 1 tablet (1 mg total) by mouth 3 (three) times daily as needed for sleep or anxiety.  90 tablet  5  . aspirin 81 MG tablet Take 81 mg by mouth daily.        Marland Kitchen buPROPion (WELLBUTRIN XL) 150 MG 24 hr tablet Take 1 tablet (150 mg total) by mouth at bedtime.  30 tablet  5  . docusate sodium (COLACE) 50 MG capsule Take by mouth every other day.      . DULoxetine (CYMBALTA) 30 MG capsule Take 3 tablets by mouth once daily  90 capsule  7  . Elastic Bandages & Supports (TRUFORM STOCKINGS 20-30MMHG) MISC Wear compression stockings throughout the day, and take off at nighttime.  2 each  1  . ferrous gluconate (FERGON) 324 MG tablet Take 1 tablet (324 mg total) by mouth 2 (two) times daily with a meal.  120 tablet  3  . Fluticasone-Salmeterol (ADVAIR DISKUS) 250-50 MCG/DOSE AEPB Inhale 1 puff into the lungs every 12 (twelve) hours.  1 each  11  . furosemide (LASIX) 80 MG tablet Take 1 tablet (80 mg total) by mouth 2 (two) times daily.  60 tablet  12  . gabapentin (NEURONTIN) 300 MG capsule Take 1 capsule (300 mg total) by mouth 2 (two) times daily.  60 capsule  5  . glucose blood (BAYER CONTOUR TEST) test strip Use to test blood sugar 3 times daily. Dx code 250.00  100 each  12  . HYDROcodone-acetaminophen (NORCO) 5-325 MG per tablet 1-2 tablets every 6 hours as needed for pain. Do not take with alcohol, do no drive or operate heavy machinery if drowsiness occurs. Do not take more than instructed.  240 tablet  3  . insulin aspart (NOVOLOG FLEXPEN) 100 UNIT/ML injection Inject 9-19 Units into the skin 3 (three) times daily before meals.  15 mL  12  . insulin detemir (LEVEMIR) 100 UNIT/ML injection Inject 50 Units into the skin at bedtime. DISPENSE 2 VIALS/ MONTH      . Insulin Syringe-Needle U-100 (INSULIN SYRINGE .3CC/31GX5/16") 31G X 5/16" 0.3 ML MISC 1 Syringe by Does not apply  route 2 (two) times daily at 10 AM and 5 PM.  100 each  11  . Insulin Syringe-Needle U-100 (INSULIN SYRINGE 1CC/30GX1/2") 30G X 1/2" 1 ML MISC 1 Container by Does not apply route 2 (two) times daily.  100 each  11  . Lancets Misc. (ACCU-CHEK FASTCLIX LANCET) KIT 1 each by Does not apply route 3 (three) times daily.  1 kit  0  . loratadine (CLARITIN) 10 MG tablet Take 1 tablet (10 mg total) by mouth daily.  90 tablet  3  . omeprazole (PRILOSEC) 40 MG capsule Take 40 mg by mouth 2 (two) times daily.       . potassium chloride SA (K-DUR,KLOR-CON) 20 MEQ tablet Take 1  tablet (20 mEq total) by mouth 3 (three) times daily.  90 tablet  12  . rosuvastatin (CRESTOR) 20 MG tablet Take 1 tablet (20 mg total) by mouth daily.  30 tablet  7  . sennosides-docusate sodium (SENOKOT-S) 8.6-50 MG tablet Take 2 tablets by mouth daily. Hold for loose stools  30 tablet  5  . tiotropium (SPIRIVA) 18 MCG inhalation capsule Place 18 mcg into inhaler and inhale daily.        SURGICAL HISTORY:  Past Surgical History  Procedure Date  . Orif clavicle fracture 01/2004    by Thana Farr. [name], M.D for Right clavicle nonunion.  . Tonsillectomy   . Carotid endarterectomy 07/04/2010    right, by [name] for asymptomatic right carotid artery stenosis  . Lipoma exision 08/2005    occipital lipoma 1.5cm - by Dr. [name]  . Hysteroscopy with endometrial ablation 06/2001    for persistent post-menopausal bleeding // by S. [name], M.D.  . Mitral valve replacement 09/20/10     with a 27-mm pericardial porcine valve (Medtronic Mosaic valve, serial #[id number]). 09/20/10, Dr Prescott Gum  . Left atrial maze procedure 09/20/10    for paroxysmal atrial fibrillation (Dr. Prescott Gum)  . Placement of left chest tube. 09/24/2010    Dr Prescott Gum  . Pr vein bypass graft,aorto-fem-pop May 2012  . Cardiac valve replacement Aug. 2012  . Colonoscopy 05/12/2011    performed by Dr. Michail Sermon. Showing small internal hemorrhoids, single tubular  adenoma polyp  . Esophagogastroduodenoscopy 05/12/2011    performed by Dr. Michail Sermon. Negative for ulcerations, biopsy negative for evidence of celiac sprue    REVIEW OF SYSTEMS:  Pertinent items are noted in HPI.   PHYSICAL EXAMINATION: not performed  ECOG PERFORMANCE STATUS: 2 - Symptomatic, <50% confined to bed  Blood pressure 118/66, pulse 88, temperature 97.7 F (36.5 C), temperature source Oral, height _0  (1.575 m), weight 188 lb 1.6 oz (85.322 kg).  LABORATORY DATA: Lab Results  Component Value Date   WBC 5.7 07/08/2011   HGB 11.2* 07/08/2011   HCT 34.9* 07/08/2011   MCV 91.4 07/08/2011   PLT 204 07/08/2011      Chemistry      Component Value Date/Time   NA 141 06/16/2011 1430   K 3.8 06/16/2011 1430   CL 99 06/16/2011 1430   CO2 32 06/16/2011 1430   BUN 22 06/16/2011 1430   CREATININE 1.06 06/16/2011 1430   CREATININE 0.90 07/24/2010 0926      Component Value Date/Time   CALCIUM 9.1 06/16/2011 1430   ALKPHOS 75 06/16/2011 1430   AST 21 06/16/2011 1430   ALT 18 06/16/2011 1430   BILITOT 0.3 06/16/2011 1430       RADIOGRAPHIC STUDIES: MRI ABDOMEN WITH AND WITHOUT CONTRAST  Technique: Multiplanar multisequence MR imaging of the abdomen was  performed both before and after administration of intravenous  contrast.  Contrast: 43m MULTIHANCE GADOBENATE DIMEGLUMINE 529 MG/ML IV SOLN  Comparison: Abdominal ultrasound 07/01/2011.  Findings: In the posterior aspect of the lower pole of the left  kidney there is a 2.7 x 2.7 x 3.0 cm lesion that it is  heterogeneous in signal intensity on pre-gadolinium T1 and T2-  weighted sequences, but demonstrates avid heterogeneous enhancement  on post-gadolinium images, consistent with a solid renal neoplasm.  This lesion also demonstrates heterogeneous high signal intensity  on diffusion weighted imaging (and low signal intensity on ADC  map). This appears relatively well defined and appears to have an  external capsule. At this time, there is no  gross abnormality of  the adjacent pararenal fat to suggest direct invasion.  Additionally, there is no associated regional adenopathy. There  are several other tiny subcentimeter lesions in the kidneys  bilaterally which are high signal intensity on T2-weighted images,  low signal intensity on pre Gadolinium T1-weighted images, and do  not enhance, compatible with simple cysts.  On out of phase dual echo imaging there is slight decrease in  signal intensity throughout the hepatic parenchyma, compatible with  mild hepatic steatosis. There are no focal hepatic lesions  identified. The appearance of the gallbladder, pancreas,  visualized spleen and right adrenal gland is unremarkable. There  is slight thickening of the left adrenal gland without a definite  dominant nodule, however, the left adrenal gland also loses signal  intensity on out-of-phase dual echo imaging, suggesting adenomatous  hyperplasia.  There appear to be single renal arteries bilaterally. Notably,  there is extensive atherosclerosis of the abdominal aorta, and this  appears to be relatively severe distally aortic caliber is narrowed  to 1.5 x 0.9 cm. The visualized portions of the proximal common  iliac arteries appear very small caliber bilaterally.  IMPRESSION:  1. 2.7 x 2.7 x 3.0 cm enhancing solid renal neoplasm in the  posterior aspect of the lower pole of the left kidney. This  appears relatively well encapsulated at this time, and there is no  definite associated regional adenopathy. This is highly suspicious  for a renal cell carcinoma and the findings suggest T1a, N0, Mx  disease (i.e., likely stage I disease).  2. Hepatic steatosis.  3. Adenomatous hypertrophy of the left adrenal gland.  4. Atherosclerosis, as above.    ASSESSMENT:61 year old female who originally saw me for chronic anemia of unclear etiology. On her workup for anemia she was found to have a elevated erythropoietin level. Because of this  we did do an ultrasound of the abdomen that showed a possibility of a renal mass on the left kidney. She then went on to get an MRI of the abdomen performed that now confirms the mass in the lower pole of the left kidney it measures 3.0 cm. This is concerning for a primary renal cell carcinoma. I have discussed these findings with the patient and her sister today. Patient was quite upset over the results of the MRI. Which is completely understandable. We again discussed the rationale for having gone all of the workup thus far. She did demonstrate understanding.  PLAN: At this time recommendation is for her to be seen by urology. I have sent a referral for Dr.Stephen Dahlstedt. She understands that he will discuss all of thesurgical options of treatment of of a renal mass.  Her CBC seems to be pretty stable I will continue to monitor this as well. I will plan on setting her up to see me back in about a month's time.  All questions were answered. The patient knows to call the clinic with any problems, questions or concerns. We can certainly see the patient much sooner if necessary.  I spent 20 minutes counseling the patient face to face. The total time spent in the appointment was 30 minutes.    Marcy Panning, MD Medical/Oncology [facility] [phone] (beeper) [phone] (Office)  07/21/2011, 10:33 AM

## 2011-07-21 NOTE — Telephone Encounter (Signed)
gve the pt her sept 2013 appt calendar along with the appt to see dr dalstadt at [facility] urology. gve the information to medical records for them to fax over office notes to the facxility for the pt's appt

## 2011-07-22 ENCOUNTER — Other Ambulatory Visit: Payer: Self-pay | Admitting: Urology

## 2011-07-22 DIAGNOSIS — N2889 Other specified disorders of kidney and ureter: Secondary | ICD-10-CM

## 2011-07-23 ENCOUNTER — Encounter: Payer: Self-pay | Admitting: Internal Medicine

## 2011-07-24 ENCOUNTER — Encounter: Payer: Self-pay | Admitting: Internal Medicine

## 2011-07-24 ENCOUNTER — Ambulatory Visit: Payer: Medicare Other | Admitting: Oncology

## 2011-07-30 ENCOUNTER — Ambulatory Visit
Admission: RE | Admit: 2011-07-30 | Discharge: 2011-07-30 | Disposition: A | Discharge status: Home / Self Care | Payer: Medicare Other | Source: Ambulatory Visit | Attending: Family Medicine | Admitting: Family Medicine

## 2011-07-30 ENCOUNTER — Other Ambulatory Visit: Payer: Self-pay | Admitting: *Deleted

## 2011-07-30 DIAGNOSIS — Z1231 Encounter for screening mammogram for malignant neoplasm of breast: Secondary | ICD-10-CM

## 2011-07-31 MED ORDER — DULOXETINE HCL 30 MG PO CPEP: ORAL_CAPSULE | ORAL | Status: DC

## 2011-07-31 MED ORDER — "INSULIN SYRINGE 30G X 1/2"" 1 ML MISC": 1.0000 | Freq: Two times a day (BID) | Status: DC

## 2011-07-31 MED ORDER — TIOTROPIUM BROMIDE MONOHYDRATE 18 MCG IN CAPS: 18.0000 ug | ORAL_CAPSULE | Freq: Every day | RESPIRATORY_TRACT | Status: DC

## 2011-07-31 MED ORDER — ROSUVASTATIN CALCIUM 20 MG PO TABS: 20.0000 mg | ORAL_TABLET | Freq: Every day | ORAL | Status: DC

## 2011-08-01 ENCOUNTER — Ambulatory Visit: Payer: Medicare Other | Admitting: Oncology

## 2011-08-02 IMAGING — CR DG CHEST 2V
2 series · 2 of 2 positions shown · non-contrast
Comparison: Two-view chest x-ray 01/11/2008 and 11/03/2006.

CLINICAL DATA: Fever.  Cough.  History of mitral valve prolapse.

CHEST - 2 VIEW 03/26/2009:

[w chest pa]
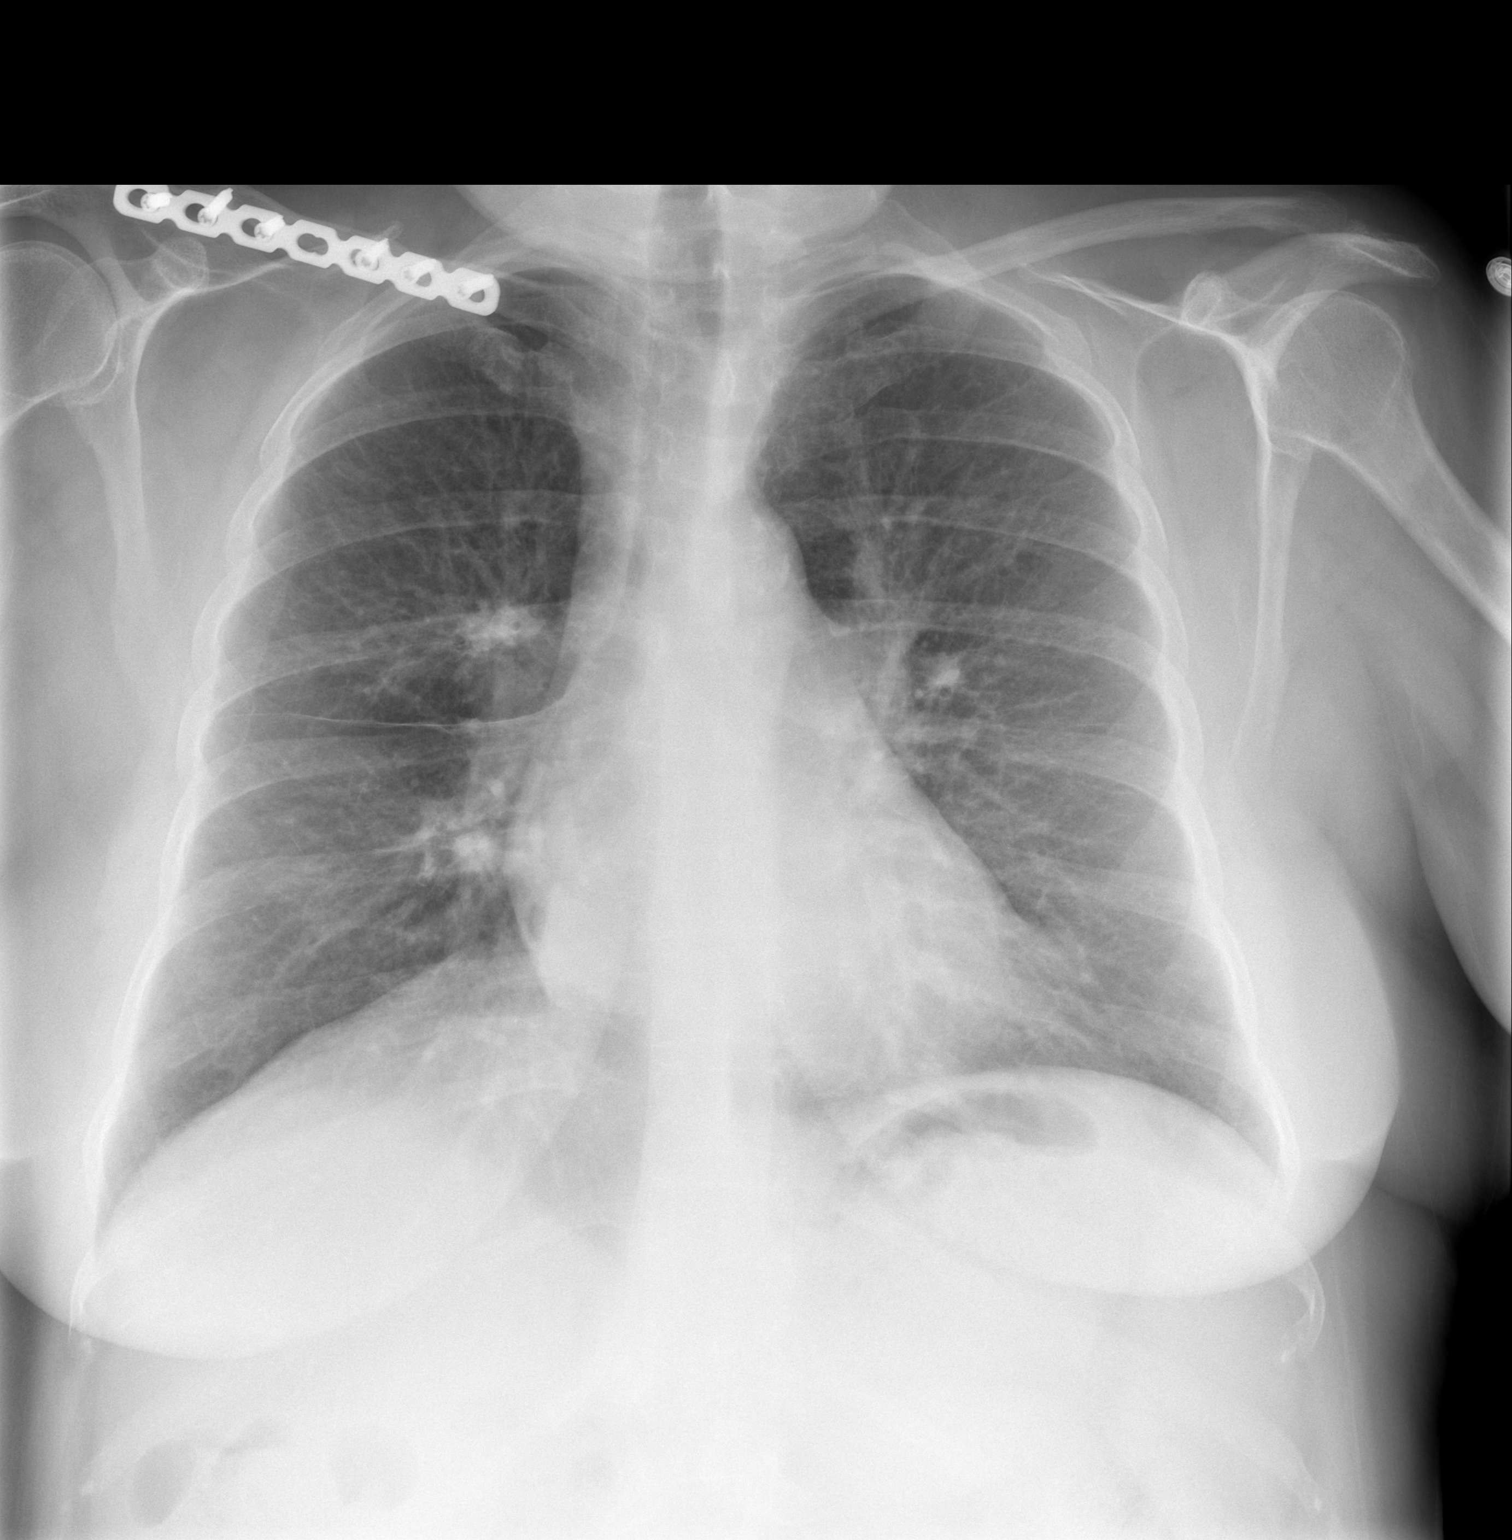

[w chest lat]
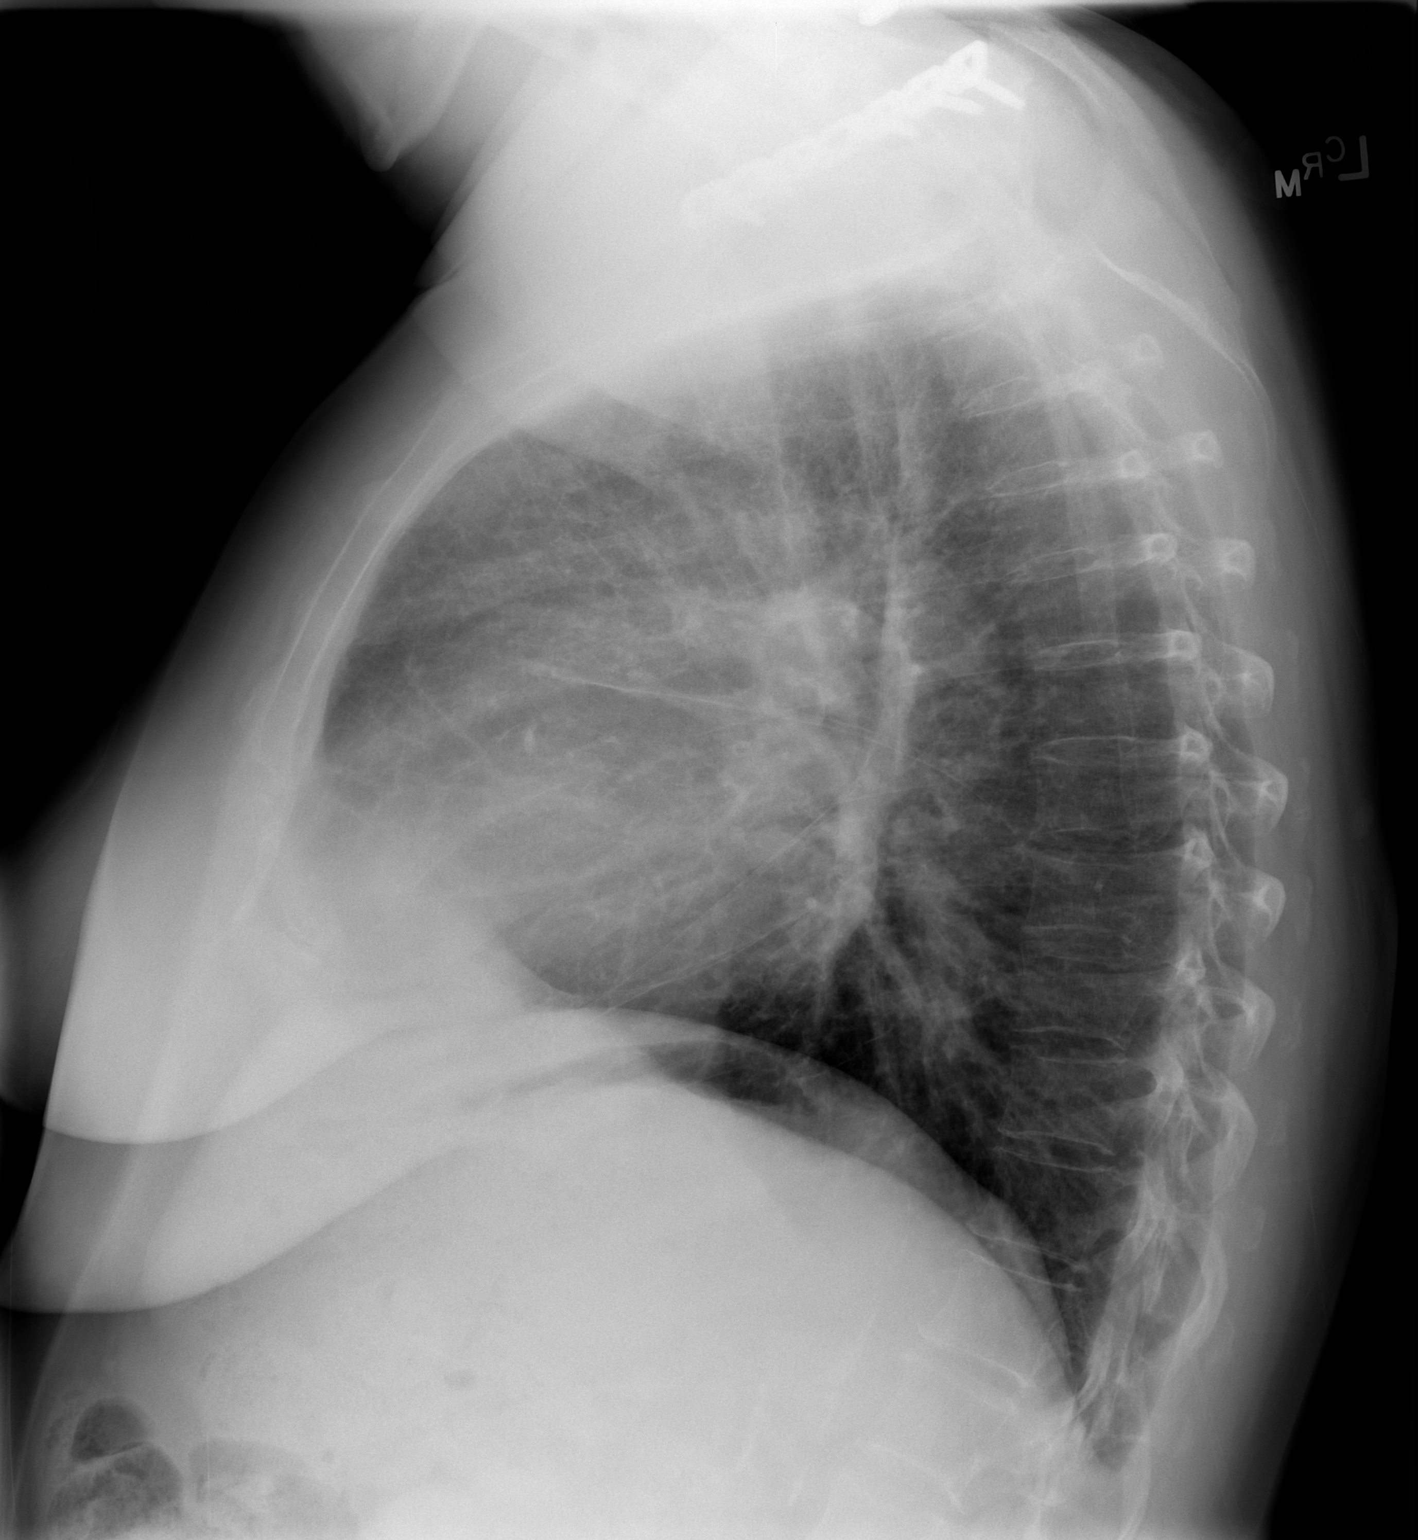

[2 of 2 positions shown; findings below may reference images not displayed]

FINDINGS: Heart size normal and stable.  Thoracic aorta mildly
atherosclerotic, unchanged.  Hilar and mediastinal contours
otherwise unremarkable.  Linear scarring in the inferior right
upper lobe.  Prominent bronchovascular markings and moderate
central peribronchial thickening, more prominent than on the prior
examinations.  No localized airspace consolidation.  No pleural
effusions.  Visualized bony thorax intact.
IMPRESSION: Moderate changes of acute bronchitis versus asthma without
localized airspace pneumonia.

## 2011-08-04 ENCOUNTER — Ambulatory Visit: Payer: Medicare Other | Admitting: Oncology

## 2011-08-04 ENCOUNTER — Telehealth: Payer: Self-pay | Admitting: *Deleted

## 2011-08-04 NOTE — Telephone Encounter (Signed)
Opened in error. Hilda Blades Marlei Glomski RN 08/04/11 3:45PM

## 2011-08-04 NOTE — Telephone Encounter (Signed)
Attempted to reach pt at number listed in [phone]   Notifed pt urology consult for [name]. Pt advised she has his number, is trying to schedule an appt but she has trouble with transportation and has to get her dog to the vet due to the allergies and scratching. Discussed with pt if public transportation was an option. Pt agreed states ," I have to call then by 5pm or I cant schedule it. The urology office is suppose to call me back but they probably wont and well, just forget it." Reassured pt the importance of her follow up appt with urology. To call the office back tomorrow and check on status of appt and try to coordinate transportation. Pt although very frustrated thanked me for the call back. Denied needing further information at this time.

## 2011-08-04 NOTE — Telephone Encounter (Signed)
Pt called with request of [facility]rologiy Dr's name who Dr. Humphrey Rolls referred her to. Line disconnected prior to getting pt information requested. Attempted call back. Unable to leave a message  Pt was refferred to Dr. Shirley Muscat.

## 2011-08-04 NOTE — Telephone Encounter (Signed)
Today feeling fine - recently had feeling everything is falling out after voiding. Did not feel anything unusual with wiping. The next day noted pink area on toilet paper ,then toilet water was pink in color. State may have cancer kidney. Suggest to call urology doctor and talk with nurse. Hilda Blades Jennilee Demarco RN 08/04/11 3:45PM

## 2011-08-11 ENCOUNTER — Other Ambulatory Visit: Payer: Medicare Other

## 2011-08-12 ENCOUNTER — Encounter: Payer: Self-pay | Admitting: Neurosurgery

## 2011-08-13 ENCOUNTER — Ambulatory Visit (INDEPENDENT_AMBULATORY_CARE_PROVIDER_SITE_OTHER): Payer: Medicare Other | Admitting: Neurosurgery

## 2011-08-13 ENCOUNTER — Other Ambulatory Visit (INDEPENDENT_AMBULATORY_CARE_PROVIDER_SITE_OTHER): Payer: Medicare Other | Admitting: *Deleted

## 2011-08-13 ENCOUNTER — Encounter: Payer: Self-pay | Admitting: Neurosurgery

## 2011-08-13 VITALS — BP 135/74 | HR 93 | Resp 14 | Ht 62.0 in | Wt 187.0 lb

## 2011-08-13 DIAGNOSIS — I6529 Occlusion and stenosis of unspecified carotid artery: Secondary | ICD-10-CM

## 2011-08-13 DIAGNOSIS — Z48812 Encounter for surgical aftercare following surgery on the circulatory system: Secondary | ICD-10-CM

## 2011-08-13 NOTE — Progress Notes (Signed)
[facility] [place] Carotid Office Note  CC: Carotid duplex surveillance Referring Physician: [name]  History of Present Illness: 61 year old female patient of [name] status post right CEA in may of 2012 and doing well. Patient denies any signs or symptoms of CVA, TIA, amaurosis fugax or any neural deficit. Patient states she does have some mild claudication with long walks however she states after her cardiac procedure for valve replacement in August she has not been able to walk as far and does give up a little easier than normal.  Past Medical History  Diagnosis Date  . Depression   . GERD (gastroesophageal reflux disease)     Followed by [name] // small bowel capsule endoscopy (11/2009) - minimal chronic inflammation, otherwise negative for  H.pylori, metaplasia, dysplasia  . Hypertension   . Hyperlipidemia   . Diabetes mellitus type II, uncontrolled     on insulin therapy  . COPD (chronic obstructive pulmonary disease) 2008    started home O2 (12/2010) // Prior significant smoker  // her CT of the chest there was no evidence of primary carcinoid tumor , scattered normal-sized mediastinal and hilar as well as axillary lymph nodes noted., no significant lymphadenopathy. COPD and emphysema with no acute cardiopulmonary disease noted , mild cardiomegaly and solitary calcified  plaque  . Moderate to severe pulmonary hypertension     severe per TEE (08/2010) - Peak RV-RA gradient 95m Hg  . Mitral regurgitation     mild to moderate per TEE (08/2010)  . Shingles   . Anemia     Anemia panel (08/2009) - iron 21, TIBC 441, UIBC 420,  ferritin 11 // Pt has been unable to tolerate iron supplementation due to chronic abdominal discomfort, source of which still is not clear. // Repeat EGD (05/2011) negative, colonoscopy (05/2011) showing only int hemorrhoids, no specific cause of anemia  . History of colonic polyps     Colonoscopy (10/2005) - colon polyp with path  nonadenomatous, no malignancy - by Dr. [name] // Colonoscopy (02/2002) - 2 polyps noted, 1 - adenomatous, 2 - hyperplastic // Followed by Dr. [name]// Repeat Colonoscopy (05/2011) - single tubular adenoma polyp - performed by Dr. [name] . Anxiety   . IBS (irritable bowel syndrome)   . Osteoporosis     DEXA (12/2004) - L-spine T score -2.6, Left hip -0.1  . Fibromyalgia   . Obstructive sleep apnea     on CPAP. // Sleep study (06/2009) - Moderate sleep apnea/ hypopnea syndrome , AHI 17.8 per hour with nonpositional hypopneas. CPAP titration to 12 CWP, AHI 2.4 per hour. Small resMed Quattro full-face mask with heated humidifier  . Restless leg syndrome   . Mitral stenosis     moderate per TEE (08/2010) // Mitral valve replacement with a 27-mm pericardial porcine valve (Medtronic Mosaic valve, serial ##[id number] on 09/20/2010 - PIvin Poot// [name] . Bilateral carotid artery stenosis     s/p right endarterectomy (06/2010). // Carotid [place](07/2010) -  60%-79% ICA stenosis, mid range of scale by velocity.  Left: Moderate-to-severe calcific and non-calcific plaque origin andproximal ICA and ECA.  60%-79% ICA stenosis, highest end of scale // Followed by [name]  . Peripheral vascular disease   . Coronary artery disease   . Internal hemorrhoids     small internal hemorrhoids noted on colonoscopy 05/2011  . Renal mass, left 07/21/2011    MRI Abdomen (07/2011) - 2.7 x 2.7  x 3.0 cm enhancing solid renal mass posterior aspect of the lower pole of the left kidney. // CT Abdomen in 09/2010 showed no evidence of mass // Followed by [name]  [name]) --> pending evaluation by [name] for consideration of cryoablation  . Irregular heart beat   . CHF (congestive heart failure)     ROS: _0  Positive   _1  Denies    General: _2  Weight loss, _3  Fever, _4  chills Neurologic: _5  Dizziness, _6  Blackouts, _7  Seizure _8  Stroke, _9  "Mini  stroke", _10  Slurred speech, _11  Temporary blindness; _12  weakness in arms or legs, _13  Hoarseness Cardiac: _14  Chest pain/pressure, _15  Shortness of breath at rest _16  Shortness of breath with exertion, _17  Atrial fibrillation or irregular heartbeat Vascular: _18  Pain in legs with walking, _19  Pain in legs at rest, _20  Pain in legs at night,  _21  Non-healing ulcer, _22  Blood clot in vein/DVT,   Pulmonary: _23  Home oxygen, _24  Productive cough, _25  Coughing up blood, _26  Asthma,  _27  Wheezing Musculoskeletal:  _28  Arthritis, _29  Low back pain, _30  Joint pain Hematologic: _31  Easy Bruising, _32  Anemia; _33  Hepatitis Gastrointestinal: _34  Blood in stool, _35  Gastroesophageal Reflux/heartburn, _36  Trouble swallowing Urinary: _37  chronic Kidney disease, _38  on HD - _39  MWF or _40  TTHS, _41  Burning with urination, _42  Difficulty urinating Skin: _43  Rashes, _44  Wounds Psychological: _45  Anxiety, _46  Depression   Social History History  Substance Use Topics  . Smoking status: Former Smoker -- 1.0 packs/day for 50 years    Types: Cigarettes    Quit date: 07/12/2010  . Smokeless tobacco: Not on file  . Alcohol Use: No    Family History Family History  Problem Relation Age of Onset  . Heart disease Father 9    died to MI at 96yo  . Heart attack Father   . Heart disease Brother 35    died of MI at 75yo  . Heart attack Brother     Allergies  Allergen Reactions  . Lorazepam     Patient's sister noted that ativan caused the patient to become extremely confused during hospitalization 09/2010  . Morphine     REACTION: Unknown reaction  . Percocet (Oxycodone-Acetaminophen)     headache  . Tramadol Hcl     REACTION: swelling    Current Outpatient Prescriptions  Medication Sig Dispense Refill  . ACCU-CHEK FASTCLIX LANCETS MISC 1 each by Does not apply route 3 (three) times daily before meals. Dx code- 250.00  102 each  12  . acetaminophen (TYLENOL) 500 MG tablet Take 1,000 mg by mouth  every 6 (six) hours as needed. For pain      . albuterol (PROVENTIL HFA;VENTOLIN HFA) 108 (90 BASE) MCG/ACT inhaler Inhale 2 puffs into the lungs every 6 (six) hours as needed. For breathing      . ALPRAZolam (XANAX) 1 MG tablet Take 1 tablet (1 mg total) by mouth 3 (three) times daily as needed for sleep or anxiety.  90 tablet  5  . aspirin 81 MG tablet Take 81 mg by mouth daily.        Marland Kitchen buPROPion (WELLBUTRIN XL) 150 MG 24 hr tablet Take 1 tablet (150 mg total) by mouth at bedtime.  30 tablet  5  . docusate sodium (COLACE) 50 MG capsule Take by mouth every other day.      . DULoxetine (CYMBALTA) 30 MG capsule Take 3 tablets by mouth once daily  90 capsule  11  . Elastic Bandages & Supports (TRUFORM STOCKINGS 20-30MMHG) MISC Wear compression stockings throughout the day, and take off at nighttime.  2 each  1  . ferrous gluconate (FERGON) 324 MG tablet Take 1 tablet (324 mg total) by mouth 2 (two) times daily with a meal.  120 tablet  3  . Fluticasone-Salmeterol (ADVAIR DISKUS) 250-50 MCG/DOSE AEPB Inhale 1 puff into the lungs every 12 (twelve) hours.  1 each  11  . furosemide (LASIX) 80 MG tablet Take 1 tablet (80 mg total) by mouth 2 (two) times daily.  60 tablet  12  . gabapentin (NEURONTIN) 300 MG capsule Take 1 capsule (300 mg total) by mouth 2 (two) times daily.  60 capsule  5  . glucose blood (BAYER CONTOUR TEST) test strip Use to test blood sugar 3 times daily. Dx code 250.00  100 each  12  . HYDROcodone-acetaminophen (NORCO) 5-325 MG per tablet 1-2 tablets every 6 hours as needed for pain. Do not take with alcohol, do no drive or operate heavy machinery if drowsiness occurs. Do not take more than instructed.  240 tablet  3  . insulin aspart (NOVOLOG FLEXPEN) 100 UNIT/ML injection Inject 9-19 Units into the skin 3 (three) times daily before meals.  15 mL  12  . insulin detemir (LEVEMIR) 100 UNIT/ML injection Inject 50 Units into the skin at bedtime. DISPENSE 2 VIALS/ MONTH      . Insulin  Syringe-Needle U-100 (INSULIN SYRINGE .3CC/31GX5/16") 31G X 5/16" 0.3 ML MISC 1 Syringe by Does not apply route 2 (two) times daily at 10 AM and 5 PM.  100 each  11  . Insulin Syringe-Needle U-100 (INSULIN SYRINGE 1CC/30GX1/2") 30G X 1/2" 1 ML MISC 1 Container by Does not apply route 2 (two) times daily.  100 each  11  . Lancets Misc. (ACCU-CHEK FASTCLIX LANCET) KIT 1 each by Does not apply route 3 (three) times daily.  1 kit  0  . loratadine (CLARITIN) 10 MG tablet Take 1 tablet (10 mg total) by mouth daily.  90 tablet  3  . omeprazole (PRILOSEC) 40 MG capsule Take 40 mg by mouth 2 (two) times daily.       . potassium chloride SA (K-DUR,KLOR-CON) 20 MEQ tablet Take 1 tablet (20 mEq total) by mouth 3 (three) times daily.  90 tablet  12  . rosuvastatin (CRESTOR) 20 MG tablet Take 1 tablet (20 mg total) by mouth daily.  30 tablet  11  . sennosides-docusate sodium (SENOKOT-S) 8.6-50 MG tablet Take 2 tablets by mouth daily. Hold for loose stools  30 tablet  5  . tiotropium (SPIRIVA) 18 MCG inhalation capsule Place 1 capsule (18 mcg total) into inhaler and inhale daily.  30 capsule  11    Physical Examination  Filed Vitals:   08/13/11 1433  BP: 135/74  Pulse: 93  Resp: 14    Body mass index is 34.20 kg/(m^2).  General:  WDWN in NAD Gait: Normal HEENT: WNL Eyes: Pupils equal Pulmonary: normal non-labored breathing , without Rales, rhonchi,  wheezing Cardiac: RRR, without  Murmurs, rubs or gallops; Abdomen: soft, NT, no masses Skin: no rashes, ulcers noted  Vascular Exam Pulses: 2+ radial pulses bilaterally Carotid bruits: Carotid pulses to auscultation no bruits are heard Extremities without ischemic  changes, no Gangrene , no cellulitis; no open wounds;  Musculoskeletal: no muscle wasting or atrophy   Neurologic: A&O X 3; Appropriate Affect ; SENSATION: normal; MOTOR FUNCTION:  moving all extremities equally. Speech is fluent/normal  Non-Invasive Vascular Imaging CAROTID DUPLEX  08/13/2011  Right ICA 40 - 59 % stenosis Left ICA 40 - 59 % stenosis   ASSESSMENT/PLAN: Asymptomatic patient that is now one-year status post right CEA and doing well. I did offer to include ABIs with her next carotid exam and she declined. We will see the patient back in one year for repeat carotid duplex. Her questions were encouraged and answered, she is in agreement with this plan.  Beatris Ship ANP   Clinic MD: Bridgett Larsson on call

## 2011-08-19 ENCOUNTER — Ambulatory Visit
Admission: RE | Admit: 2011-08-19 | Discharge: 2011-08-19 | Disposition: A | Discharge status: Home / Self Care | Payer: Medicare Other | Source: Ambulatory Visit | Attending: Urology | Admitting: Urology

## 2011-08-19 VITALS — BP 99/56 | HR 83 | Temp 98.0°F | Resp 16 | Ht 62.0 in | Wt 188.0 lb

## 2011-08-19 DIAGNOSIS — N2889 Other specified disorders of kidney and ureter: Secondary | ICD-10-CM

## 2011-08-20 ENCOUNTER — Other Ambulatory Visit: Payer: Self-pay | Admitting: *Deleted

## 2011-08-20 MED ORDER — "INSULIN SYRINGE 31G X 5/16"" 0.3 ML MISC": 1.0000 | Freq: Two times a day (BID) | Status: DC

## 2011-08-21 ENCOUNTER — Telehealth: Payer: Self-pay | Admitting: Cardiology

## 2011-08-21 NOTE — Telephone Encounter (Signed)
Pt needs surgical clearance for left renal biopsy and cryo ablation of the mass and it will be under general anesthesia with Dr. Andres Labrum

## 2011-08-21 NOTE — Telephone Encounter (Signed)
Patient will be high risk for any procedure from a pulmonary standpoint. [name]

## 2011-08-21 NOTE — Telephone Encounter (Signed)
Telephone note faxed to the number provided

## 2011-08-22 ENCOUNTER — Telehealth: Payer: Self-pay | Admitting: Dietician

## 2011-08-22 DIAGNOSIS — W19XXXA Unspecified fall, initial encounter: Secondary | ICD-10-CM

## 2011-08-22 NOTE — Telephone Encounter (Signed)
CSW placed call to pt as a follow up to the conversation pt had with RD.  CSW provided emotional support to [name] as she voiced concern over her current medical issues, multiple physician visits and tests that she has been attending.  [name] recently described an incident were she has not been sleeping and fell out of a chair, having dosed off.  Pt states she was to attend cardiac rehab but has not been able to schedule due to her limited transportation and other medical appts.  Pt's sister is her only support and means of transportation, sister is hesitant to believe that pt has cancer.  Sister will be unable to attend certain medical appt with [name], pt appears to disappointment with the lack of support from her sister.  [name] has also been dealing with the stress of her only dau moving to [place] and calling with complaints of the move and her marital relationship.  Pt reports recently it has been stressful dealing with the multiple medical appointments, diagnoses, and family stress.   Pt requesting [facility] RN and PT, as PT benefited her in the past.  Pt may be appropriate for [facility] Card[facility] programs. CSW discussed benefits of [facility], pt in agreement and referral made. CSW will complete referral to [facility] once order is placed on chart.

## 2011-08-22 NOTE — Telephone Encounter (Signed)
Called patient to check on her. She still has not slept, hurting all over. I suggested going to the ER, but patient reports she has had pain ongoing since open heart surgery.  She is planning to take a xanax and try to get some rest. She asked for Bonnita Nasuti, our nurse to call her

## 2011-08-22 NOTE — Telephone Encounter (Signed)
I will put in order for [facility] referral. Leave in Dr Leilani Merl to see if she has additional suggestions.

## 2011-08-22 NOTE — Telephone Encounter (Signed)
Patient called stating "I am losing my mind... Has taken so much Novolog in the past 24 hours that has not doesn't do anything. Fell in chair night before last, no bruises, some aches and pains Stressed about cancer and family issues so much she has not slept at all in 2 nights.  Lowest blood sugar recently was 116, higher the past 2-3 days: Today: took  726 am 10 units novolog for  360 sugar                     528 am  17 units  for 374  08-21-10: 1011 PM 50 units levemir,                606 Pm 16 units novolog for sugar 344                1221PM- 14 units novolog for sugar 329               554 am  17 units novolog for sugar  316  08-20-10 50 units levemir,                730 PM 20 Novolog for sugar 352               1118 am-  13 units Novolog for sugar 237               650 am  20 units Novolog for  sugar 386              324 am-  10 unit Novolog sugar was 403  Is eating her usual: sandwiches,  frozen meals, applesauce and cereal.  No symptoms other than a few times coughed up some phlegm small amounts- clear, white. Some Urine change doctors say due to cancer.    Feels if she can get some sleep, she might be better. Needed to talk with someone and maybe more help at home. Feels scared of cancer and falling, needs someone to talk to. Has no way to get here until Monday or Tuesday.   Discussed with attending and social worker: request Home Health order. Route to Dr. Guy Sandifer per patient request.

## 2011-08-25 ENCOUNTER — Other Ambulatory Visit: Payer: Self-pay | Admitting: *Deleted

## 2011-08-25 ENCOUNTER — Telehealth: Payer: Self-pay | Admitting: Licensed Clinical Social Worker

## 2011-08-25 DIAGNOSIS — E1142 Type 2 diabetes mellitus with diabetic polyneuropathy: Secondary | ICD-10-CM

## 2011-08-25 DIAGNOSIS — IMO0002 Reserved for concepts with insufficient information to code with codable children: Secondary | ICD-10-CM

## 2011-08-25 DIAGNOSIS — M797 Fibromyalgia: Secondary | ICD-10-CM

## 2011-08-25 DIAGNOSIS — E1165 Type 2 diabetes mellitus with hyperglycemia: Secondary | ICD-10-CM

## 2011-08-25 MED ORDER — GABAPENTIN 300 MG PO CAPS: 300.0000 mg | ORAL_CAPSULE | Freq: Two times a day (BID) | ORAL | Status: DC

## 2011-08-25 MED ORDER — INSULIN DETEMIR 100 UNIT/ML ~~LOC~~ SOLN: 50.0000 [IU] | Freq: Every day | SUBCUTANEOUS | Status: DC

## 2011-08-25 MED ORDER — HYDROCODONE-ACETAMINOPHEN 5-325 MG PO TABS: ORAL_TABLET | ORAL | Status: DC

## 2011-08-25 NOTE — Telephone Encounter (Signed)
Pls sch appt next 60 days with Dr Guy Sandifer.

## 2011-08-25 NOTE — Telephone Encounter (Signed)
CSW completed referral for Home Health RN and PT.  Referral given to [facility], pt in agreement.

## 2011-08-25 NOTE — Telephone Encounter (Signed)
Called pt fri as requested by donnap., mostly pt needed to talk, no questions no plans. Told her to always call for help or reassurance, she is agreeable

## 2011-08-25 NOTE — Procedures (Unsigned)
CAROTID DUPLEX EXAM  INDICATION:  Carotid disease.  HISTORY: Diabetes:  Yes Cardiac:  Valve replacement in August 2012 Hypertension:  No Smoking:  Previous Previous Surgery:  Right carotid endarterectomy on 07/04/2010 CV History:  Currently asymptomatic Amaurosis Fugax No, Paresthesias No, Hemiparesis No                                      RIGHT             LEFT Brachial systolic pressure:         112               106 Brachial Doppler waveforms:         Normal            Normal Vertebral direction of flow:        Antegrade/resistive                 Antegrade DUPLEX VELOCITIES (cm/sec) CCA peak systolic                   89                201 ECA peak systolic                   208               007 ICA peak systolic                   157               121 ICA end diastolic                   42                44 PLAQUE MORPHOLOGY:                  Heterogeneous     Heterogeneous PLAQUE AMOUNT:                      Mild              Moderate PLAQUE LOCATION:                    ECA/CCA           ICA/ECA/CCA  IMPRESSION: 1. Doppler velocities suggest a 40% to 59% stenosis of the right     proximal internal carotid artery and a high-end 40% to 59% stenosis     of the left proximal internal carotid artery. 2. Bilateral external carotid artery stenoses noted. 3. Doppler velocities of bilateral internal carotid arteries appear     less then previously recorded when compared to the previous exam on     02/10/2011.  ___________________________________________ V. Leia Alf, MD  CH/MEDQ  D:  08/18/2011  T:  08/18/2011  Job:  [id number]

## 2011-08-26 NOTE — Telephone Encounter (Signed)
Called to pharm 

## 2011-08-28 NOTE — Progress Notes (Signed)
Patient ID: [name], female   DOB: 1950-09-22, 61 y.o.   MRN: [id number]   NEW PATIENT OFFICE VISIT  Chief Complaint: Mrs. Kolton is referred by [name] for consideration of left renal ablation to treat a left renal tumor.  History: The patient is a 61 year old female who was found to have a left renal mass while being worked up by Dr. Lovett Calender for anema. Abdominal ultrasound on 07/01/11 demonstrated a probable 2.7 cm solid left renal mass. MRI on 07/10/11 showed a roughly 2.7 cm solid, enhancing mass emanating from the posterior lower pole cortex of the left kidney. The patient states that she has had some periodic left flank discomfort. She had one episode of pink tinged urine back in May but has not had any hematuria since. The patient has no history of malignancy or prior renal surgery.  Past Medical History: 1. History of COPD and pulmonary hypertension. The patient is followed by Dr. Guy Sandifer of the [facility]. She is not followed by a pulmonologist. 2. History of mitral valve replacement and paroxysmal atrial fibrillation. Mitral valve replacement and left atrial Maze procedure were performed on 09/20/2010 by Dr. Tharon Aquas Trigt. 3. The patient currently is in sinus rhythm and is followed by Dr. Kirk Ruths. She has been taken off the chronic Coumadin and amiodarone therapy. 4. Status post prior right carotid endarterectomy in May, 2012 by Dr. Annamarie Major. 5. History of fibromyalgia. 6. History of insulin dependent diabetes. 7. History of hypertension 8. History of hyperlipidemia 9. History of anxiety 10. History of gastroesophageal reflux disease 11. History of anemia 12. History of osteoporosis 13. History of obstructive sleep apnea. CPAP at night. 14. History of peripheral neuropathy  Medications: Advair diskus 1 puff twice daily, Spiriva inhaler daily, Proair inhaler as needed, omeprazole 40 mg daily,  gabapentin 300 mg twice daily, cymbalta 30 mg three capsules daily, Klor 20 mEq 3 pills daily, Crestor 20 mg daily, Lasix 80 mg daily, Bupropion 150 mg daily, Vicodin 5-325 mg 1-2 pills every 6 hrs as needed, alprazolam 1 mg three times daily as needed, Levemir insulin 50 U at bedtime, Novolog insulin sliding scale, aspirin 81 mg daily, ferrous gluconate 324 mg daily, loratidine 10 mg daily Colace every other day, Senekot every other day.  Allergies: Morphine, Ativan, Percocet, Tramadol.  Social History: The patient is divorced and has two children. She lives in [place] and is on disability. She smoked two packs a day for nearly 50 years and quit smoking approximately 1 year ago. She does not drink alcohol.  Family History: Brother and father with history of heart problems. Brother with history of kidney stones. Mother died of pneumonia. Aunt with history of some type of cancer.  Review of Systems: Ear, nose, throat: Occasional difficulty swallowing. Eyes: Recent diagnosis of bilateral cataracts. The patient is seeing Dr. Katy Fitch. Gastrointestinal: Chronic constipation. The patient denies abdominal pain, nausea or diarrhea. Genitourinary: Some pain with urination occasionally. No current hematuria. No flank pain. Cardiovascular: No chest pain or palpitations. Musculoskeletal: Restless leg syndrome. Neurologic: Some trouble with balance. Psychiatric: History of depression and anxiety. Respiratory: No cough. Shortness of breath with exertion. Sleep: The patient uses a CPAP machine at night.  Exam: Vital signs: Blood pressure 99/56, pulse 83, temperature 98, oxygen saturation 97% on one liter. General: No acute distress. Chest: Distant breath sounds. No wheezing. Heart: Regular rate and rhythm. No murmurs. Abdomen: Soft and nontender. No CVA tenderness. Extremities: Mild bilateral lower  extremity weakness.  Labs: BUN 22, Cr 1.06, PT 13.5, INR 1.01, PTT 29, WBC 5.7, Hemoglobin  11.2, platelets 204.  Imaging: I personally reviewed prior imaging studies. The MRI best depicts the left renal lesion which emanates in an exophytic fashion from the posterior lower pole cortex. The most accurate diameter measurement is 2.7 cm. This lesion appears solid and enhances diffusely after administration of Gadolinium. There are no other suspicious renal lesions. There is no evidence of metastatic disease in the abdomen.  Assessment and Plan: I met with Mrs. Walrond and her sister. We discussed treatment options for the left renal mass which appears likely to represent a renal carcinoma by imaging. As [name]  has indicated, the patient is a higher risk for surgical resection given her multiple medical comorbidities. Based on size and location, the lesion is amenable to percutaneous cryoablation. Details of cryoablation, including risks, were discussed with the patient and her sister. Biopsy could be performed at the time of cryoablation to avoid additional procedures. The procedure would require a limited time under general anesthesia and overnight hospital stay. After discussion, the patient would like to proceed electively with cryoablation. I told her that we will check with Dr. Stanford Breed regarding any necessary cardiac clearance. Currently, her respiratory status appears stable and she is only requiring 1 liter of oxygen. We will proceed with the scheduling process. I told the patient that I would anticipate potentially being able to perform cryoablation either at the end of July or in early August.

## 2011-09-01 ENCOUNTER — Telehealth: Payer: Self-pay | Admitting: Dietician

## 2011-09-01 NOTE — Telephone Encounter (Signed)
Wanted to tell us that pen needles and syringes Rx was incorrect: she got it fixed by talking with pharmacy but for future reference she uses:   Syringes BD holds100 unit x  31g x 5/16" (93m) Pen needles- mini  31g  x 5 mm  Wants new 5 MM syringes when it hits the market.

## 2011-09-08 ENCOUNTER — Other Ambulatory Visit (HOSPITAL_COMMUNITY): Payer: Self-pay | Admitting: Interventional Radiology

## 2011-09-08 DIAGNOSIS — N2889 Other specified disorders of kidney and ureter: Secondary | ICD-10-CM

## 2011-09-10 ENCOUNTER — Encounter (HOSPITAL_COMMUNITY): Payer: Self-pay | Admitting: Pharmacy Technician

## 2011-09-11 ENCOUNTER — Other Ambulatory Visit: Payer: Self-pay | Admitting: Physician Assistant

## 2011-09-11 HISTORY — PX: CRYOABLATION: SHX1415

## 2011-09-15 ENCOUNTER — Other Ambulatory Visit: Payer: Self-pay | Admitting: Internal Medicine

## 2011-09-15 ENCOUNTER — Encounter (HOSPITAL_COMMUNITY): Payer: Self-pay

## 2011-09-15 ENCOUNTER — Encounter (HOSPITAL_COMMUNITY)
Admission: RE | Admit: 2011-09-15 | Discharge: 2011-09-15 | Disposition: A | Discharge status: Home / Self Care | Payer: Medicare Other | Source: Ambulatory Visit | Attending: Interventional Radiology | Admitting: Interventional Radiology

## 2011-09-15 ENCOUNTER — Other Ambulatory Visit: Payer: Self-pay | Admitting: Dietician

## 2011-09-15 ENCOUNTER — Ambulatory Visit (HOSPITAL_COMMUNITY)
Admission: RE | Admit: 2011-09-15 | Discharge: 2011-09-15 | Disposition: A | Discharge status: Home / Self Care | Payer: Medicare Other | Source: Ambulatory Visit | Attending: Interventional Radiology | Admitting: Interventional Radiology

## 2011-09-15 DIAGNOSIS — Z01812 Encounter for preprocedural laboratory examination: Secondary | ICD-10-CM | POA: Insufficient documentation

## 2011-09-15 DIAGNOSIS — E119 Type 2 diabetes mellitus without complications: Secondary | ICD-10-CM

## 2011-09-15 DIAGNOSIS — Z01818 Encounter for other preprocedural examination: Secondary | ICD-10-CM | POA: Insufficient documentation

## 2011-09-15 HISTORY — DX: Personal history of other medical treatment: Z92.89

## 2011-09-15 LAB — APTT: aPTT: 32 seconds (ref 24–37)

## 2011-09-15 LAB — BASIC METABOLIC PANEL
BUN: 19 mg/dL (ref 6–23)
CO2: 28 mEq/L (ref 19–32)
Calcium: 9.5 mg/dL (ref 8.4–10.5)
Chloride: 97 mEq/L (ref 96–112)
Creatinine, Ser: 0.8 mg/dL (ref 0.50–1.10)
GFR calc Af Amer: 90 mL/min — ABNORMAL LOW (ref >90)
GFR calc non Af Amer: 77 mL/min — ABNORMAL LOW (ref >90)
Glucose, Bld: 295 mg/dL — ABNORMAL HIGH (ref 70–99)
Potassium: 4 mEq/L (ref 3.5–5.1)
Sodium: 137 mEq/L (ref 135–145)

## 2011-09-15 LAB — CBC
HCT: 38.5 % (ref 36.0–46.0)
Hemoglobin: 12.6 g/dL (ref 12.0–15.0)
MCH: 29.9 pg (ref 26.0–34.0)
MCHC: 32.7 g/dL (ref 30.0–36.0)
MCV: 91.2 fL (ref 78.0–100.0)
Platelets: 194 10*3/uL (ref 150–400)
RBC: 4.22 MIL/uL (ref 3.87–5.11)
RDW: 14.1 % (ref 11.5–15.5)
WBC: 6.6 10*3/uL (ref 4.0–10.5)

## 2011-09-15 LAB — ABO/RH: ABO/RH(D): A NEG

## 2011-09-15 LAB — PROTIME-INR
INR: 1.02 (ref 0.00–1.49)
Prothrombin Time: 13.6 seconds (ref 11.6–15.2)

## 2011-09-15 MED ORDER — GLUCOSE BLOOD VI STRP: ORAL_STRIP | Status: DC

## 2011-09-15 MED ORDER — ACCU-CHEK FASTCLIX LANCET KIT: 1.0000 | PACK | Freq: Three times a day (TID) | Status: DC

## 2011-09-15 NOTE — Patient Instructions (Addendum)
[name]  09/15/2011   Your procedure is scheduled on:  09/19/11    COME TO RADIOLOGY DEPARTMENT      Procedure [id number]    FRIDAY  Report to [facility] Radiology   0930    AM.  Call this number if you have problems the morning of surgery: [phone]   Or PST   [phone]  [name] BRING INHALERS WITH YOU TO THE HOSPITAL/  BRING CPAP MASK with you to hospital  Remember: TAKE ONE HALF DOSE INSULIN Thursday NIGHT  Do not eat food or drink any fluids :After Midnight. Thursday NIGHT                                          DO HAVE SNACK BEFORE BEDTIME OR MIDNIGHT Thursday NIGHT  DO NOT TAKE ANY INSULIN THE MORNING OF YOUR PROCEDURE  Take these medicines the morning of surgery with A SIP OF WATER: Cymbalta, Gabopentin, Prolisec        Spirivia, ADVAIR                                               May take Norco if needed  Or use  Albuterol   Do not wear jewelry, make-up or nail polish.  Do not wear lotions, powders, or perfumes. You may wear deodorant.  Do not shave 48 hours prior to surgery.  Do not bring valuables to the hospital.  Contacts, dentures or bridgework may not be worn into surgery.  Leave suitcase in the car. After surgery it may be brought to your room.  For patients admitted to the hospital, checkout time is 11:00 AM the day of discharge.   Patients discharged the day of surgery will not be allowed to drive home.  Name and phone number of your driver:   family                                                                   Special Instructions: CHG Shower Use Special Wash: 1/2 bottle night before surgery and 1/2 bottle morning of surgery. REGULAR SOAP FACE AND PRIVATES              LADIES- NO SHAVING 57 HOURS BEFORE USING BETASEPT SOAP.                 Please read over the following fact sheets that you were given: MRSA Information

## 2011-09-15 NOTE — Pre-Procedure Instructions (Signed)
Notified [name] in IR of abnormal glucose who stated would inform PA. Note to Dr Lattie Corns in box as well

## 2011-09-15 NOTE — Progress Notes (Signed)
Dr Kathlene Cote- please review abnormal BMET from 09/15/11 Thanks- spoke with Anderson Malta in IR at 1250 also

## 2011-09-15 NOTE — Pre-Procedure Instructions (Signed)
Per Anderson Malta in [place], [place] is aware of glucose level from today

## 2011-09-15 NOTE — Pre-Procedure Instructions (Signed)
Chest x ray EPIC 4/13,  EKG 5/13 EPIC,  2 D Eccho 11/12 chart,  Carotid duplex exam 7/13 chart,  7/13 Clearance note dr Stanford Breed- HIGH RISK PULMONARY STATUS EPIC, LOV Dr Stanford Breed 5/13 Epic

## 2011-09-16 ENCOUNTER — Other Ambulatory Visit: Payer: Self-pay | Admitting: Radiology

## 2011-09-19 ENCOUNTER — Observation Stay (HOSPITAL_COMMUNITY)
Admission: RE | Admit: 2011-09-19 | Discharge: 2011-09-21 | Disposition: A | Discharge status: Home / Self Care | Payer: Medicare Other | Source: Ambulatory Visit | Attending: Interventional Radiology | Admitting: Interventional Radiology

## 2011-09-19 ENCOUNTER — Encounter (HOSPITAL_COMMUNITY): Payer: Self-pay

## 2011-09-19 ENCOUNTER — Ambulatory Visit (HOSPITAL_COMMUNITY): Payer: Medicare Other | Admitting: Certified Registered Nurse Anesthetist

## 2011-09-19 ENCOUNTER — Encounter (HOSPITAL_COMMUNITY): Payer: Self-pay | Admitting: Certified Registered Nurse Anesthetist

## 2011-09-19 ENCOUNTER — Ambulatory Visit (HOSPITAL_COMMUNITY)
Admission: RE | Admit: 2011-09-19 | Discharge: 2011-09-19 | Disposition: A | Discharge status: Home / Self Care | Payer: Medicare Other | Source: Ambulatory Visit | Attending: Interventional Radiology | Admitting: Interventional Radiology

## 2011-09-19 ENCOUNTER — Encounter (HOSPITAL_COMMUNITY)
Admission: RE | Disposition: A | Discharge status: Home / Self Care | Payer: Self-pay | Source: Ambulatory Visit | Attending: Interventional Radiology

## 2011-09-19 VITALS — BP 123/55 | HR 78 | Temp 98.1°F | Resp 18

## 2011-09-19 DIAGNOSIS — E1165 Type 2 diabetes mellitus with hyperglycemia: Secondary | ICD-10-CM

## 2011-09-19 DIAGNOSIS — F419 Anxiety disorder, unspecified: Secondary | ICD-10-CM

## 2011-09-19 DIAGNOSIS — E119 Type 2 diabetes mellitus without complications: Secondary | ICD-10-CM

## 2011-09-19 DIAGNOSIS — J449 Chronic obstructive pulmonary disease, unspecified: Secondary | ICD-10-CM

## 2011-09-19 DIAGNOSIS — IMO0002 Reserved for concepts with insufficient information to code with codable children: Secondary | ICD-10-CM

## 2011-09-19 DIAGNOSIS — G4733 Obstructive sleep apnea (adult) (pediatric): Secondary | ICD-10-CM | POA: Insufficient documentation

## 2011-09-19 DIAGNOSIS — D649 Anemia, unspecified: Secondary | ICD-10-CM

## 2011-09-19 DIAGNOSIS — N2889 Other specified disorders of kidney and ureter: Secondary | ICD-10-CM

## 2011-09-19 DIAGNOSIS — R252 Cramp and spasm: Secondary | ICD-10-CM

## 2011-09-19 DIAGNOSIS — C649 Malignant neoplasm of unspecified kidney, except renal pelvis: Principal | ICD-10-CM | POA: Insufficient documentation

## 2011-09-19 DIAGNOSIS — K219 Gastro-esophageal reflux disease without esophagitis: Secondary | ICD-10-CM

## 2011-09-19 DIAGNOSIS — I10 Essential (primary) hypertension: Secondary | ICD-10-CM

## 2011-09-19 DIAGNOSIS — E1142 Type 2 diabetes mellitus with diabetic polyneuropathy: Secondary | ICD-10-CM

## 2011-09-19 DIAGNOSIS — E785 Hyperlipidemia, unspecified: Secondary | ICD-10-CM | POA: Insufficient documentation

## 2011-09-19 DIAGNOSIS — Z79899 Other long term (current) drug therapy: Secondary | ICD-10-CM | POA: Insufficient documentation

## 2011-09-19 DIAGNOSIS — J4489 Other specified chronic obstructive pulmonary disease: Secondary | ICD-10-CM | POA: Insufficient documentation

## 2011-09-19 LAB — BASIC METABOLIC PANEL
BUN: 23 mg/dL (ref 6–23)
CO2: 30 mEq/L (ref 19–32)
Calcium: 9.1 mg/dL (ref 8.4–10.5)
Chloride: 98 mEq/L (ref 96–112)
Creatinine, Ser: 0.97 mg/dL (ref 0.50–1.10)
GFR calc Af Amer: 71 mL/min — ABNORMAL LOW (ref >90)
GFR calc non Af Amer: 61 mL/min — ABNORMAL LOW (ref >90)
Glucose, Bld: 279 mg/dL — ABNORMAL HIGH (ref 70–99)
Potassium: 4 mEq/L (ref 3.5–5.1)
Sodium: 136 mEq/L (ref 135–145)

## 2011-09-19 LAB — GLUCOSE, CAPILLARY
Glucose-Capillary: 275 mg/dL — ABNORMAL HIGH (ref 70–99)
Glucose-Capillary: 237 mg/dL — ABNORMAL HIGH (ref 70–99)
Glucose-Capillary: 252 mg/dL — ABNORMAL HIGH (ref 70–99)
Glucose-Capillary: 215 mg/dL — ABNORMAL HIGH (ref 70–99)

## 2011-09-19 LAB — CBC
HCT: 36.7 % (ref 36.0–46.0)
Hemoglobin: 12 g/dL (ref 12.0–15.0)
MCH: 30.2 pg (ref 26.0–34.0)
MCHC: 32.7 g/dL (ref 30.0–36.0)
MCV: 92.2 fL (ref 78.0–100.0)
Platelets: 199 10*3/uL (ref 150–400)
RBC: 3.98 MIL/uL (ref 3.87–5.11)
RDW: 14.2 % (ref 11.5–15.5)
WBC: 16.3 10*3/uL — ABNORMAL HIGH (ref 4.0–10.5)

## 2011-09-19 LAB — TYPE AND SCREEN
ABO/RH(D): A NEG
Antibody Screen: NEGATIVE

## 2011-09-19 LAB — APTT: aPTT: 28 seconds (ref 24–37)

## 2011-09-19 LAB — PROTIME-INR
INR: 1.02 (ref 0.00–1.49)
Prothrombin Time: 13.6 seconds (ref 11.6–15.2)

## 2011-09-19 SURGERY — RADIO FREQUENCY ABLATION
Anesthesia: General | Wound class: Clean

## 2011-09-19 MED ORDER — ONDANSETRON HCL 4 MG/2ML IJ SOLN
4.0000 mg | Freq: Four times a day (QID) | INTRAMUSCULAR | Status: DC | PRN
  Administered 2011-09-19: 4 mg via INTRAVENOUS
  Filled 2011-09-19: qty 2

## 2011-09-19 MED ORDER — MIDAZOLAM HCL 5 MG/5ML IJ SOLN
INTRAMUSCULAR | Status: DC | PRN
  Administered 2011-09-19: 2 mg via INTRAVENOUS

## 2011-09-19 MED ORDER — ALBUTEROL SULFATE HFA 108 (90 BASE) MCG/ACT IN AERS
2.0000 | INHALATION_SPRAY | Freq: Four times a day (QID) | RESPIRATORY_TRACT | Status: DC | PRN
Start: 2011-09-19 — End: 2011-09-20
  Filled 2011-09-19: qty 6.7

## 2011-09-19 MED ORDER — INSULIN ASPART 100 UNIT/ML ~~LOC~~ SOLN
SUBCUTANEOUS | Status: AC
  Filled 2011-09-19: qty 1

## 2011-09-19 MED ORDER — PANTOPRAZOLE SODIUM 40 MG PO TBEC
40.0000 mg | DELAYED_RELEASE_TABLET | Freq: Every day | ORAL | Status: DC
  Administered 2011-09-19: 40 mg via ORAL
  Filled 2011-09-19: qty 1

## 2011-09-19 MED ORDER — INSULIN ASPART 100 UNIT/ML ~~LOC~~ SOLN: 9.0000 [IU] | Freq: Three times a day (TID) | SUBCUTANEOUS | Status: DC

## 2011-09-19 MED ORDER — CEFAZOLIN SODIUM-DEXTROSE 2-3 GM-% IV SOLR
INTRAVENOUS | Status: AC
  Filled 2011-09-19: qty 50

## 2011-09-19 MED ORDER — INSULIN ASPART 100 UNIT/ML ~~LOC~~ SOLN
0.0000 [IU] | Freq: Every day | SUBCUTANEOUS | Status: DC
  Administered 2011-09-19 – 2011-09-20 (×2): 2 [IU] via SUBCUTANEOUS

## 2011-09-19 MED ORDER — SENNA-DOCUSATE SODIUM 8.6-50 MG PO TABS
1.0000 | ORAL_TABLET | ORAL | Status: DC
  Administered 2011-09-19: 1 via ORAL
  Filled 2011-09-19: qty 1

## 2011-09-19 MED ORDER — LIDOCAINE HCL (CARDIAC) 20 MG/ML IV SOLN
INTRAVENOUS | Status: DC | PRN
  Administered 2011-09-19: 100 mg via INTRAVENOUS

## 2011-09-19 MED ORDER — BUPROPION HCL ER (XL) 150 MG PO TB24
150.0000 mg | ORAL_TABLET | Freq: Every day | ORAL | Status: DC
  Administered 2011-09-19: 150 mg via ORAL
  Filled 2011-09-19: qty 1

## 2011-09-19 MED ORDER — FLUTICASONE-SALMETEROL 250-50 MCG/DOSE IN AEPB
1.0000 | INHALATION_SPRAY | Freq: Two times a day (BID) | RESPIRATORY_TRACT | Status: DC
  Administered 2011-09-20: 1 via RESPIRATORY_TRACT
  Filled 2011-09-19: qty 14

## 2011-09-19 MED ORDER — INSULIN ASPART 100 UNIT/ML ~~LOC~~ SOLN
0.0000 [IU] | Freq: Three times a day (TID) | SUBCUTANEOUS | Status: DC
  Administered 2011-09-19: 8 [IU] via SUBCUTANEOUS
  Administered 2011-09-20: 3 [IU] via SUBCUTANEOUS
  Administered 2011-09-20: 15 [IU] via SUBCUTANEOUS
  Administered 2011-09-20 – 2011-09-21 (×2): 5 [IU] via SUBCUTANEOUS

## 2011-09-19 MED ORDER — TIOTROPIUM BROMIDE MONOHYDRATE 18 MCG IN CAPS
18.0000 ug | ORAL_CAPSULE | Freq: Every day | RESPIRATORY_TRACT | Status: DC
  Administered 2011-09-20: 18 ug via RESPIRATORY_TRACT
  Filled 2011-09-19: qty 5

## 2011-09-19 MED ORDER — SUCCINYLCHOLINE CHLORIDE 20 MG/ML IJ SOLN
INTRAMUSCULAR | Status: DC | PRN
  Administered 2011-09-19: 100 mg via INTRAVENOUS

## 2011-09-19 MED ORDER — FENTANYL CITRATE 0.05 MG/ML IJ SOLN
INTRAMUSCULAR | Status: DC | PRN
  Administered 2011-09-19: 50 ug via INTRAVENOUS

## 2011-09-19 MED ORDER — ALBUTEROL SULFATE HFA 108 (90 BASE) MCG/ACT IN AERS
INHALATION_SPRAY | RESPIRATORY_TRACT | Status: AC
  Filled 2011-09-19: qty 6.7

## 2011-09-19 MED ORDER — INSULIN DETEMIR 100 UNIT/ML ~~LOC~~ SOLN
50.0000 [IU] | Freq: Every day | SUBCUTANEOUS | Status: DC
  Administered 2011-09-19: 50 [IU] via SUBCUTANEOUS
  Filled 2011-09-19: qty 10

## 2011-09-19 MED ORDER — LORATADINE 10 MG PO TABS
10.0000 mg | ORAL_TABLET | Freq: Every day | ORAL | Status: DC
  Administered 2011-09-19: 10 mg via ORAL
  Filled 2011-09-19: qty 1

## 2011-09-19 MED ORDER — HYDROMORPHONE HCL PF 1 MG/ML IJ SOLN
1.0000 mg | INTRAMUSCULAR | Status: DC | PRN
  Administered 2011-09-19 – 2011-09-21 (×7): 1 mg via INTRAVENOUS
  Filled 2011-09-19 (×7): qty 1

## 2011-09-19 MED ORDER — FUROSEMIDE 80 MG PO TABS
80.0000 mg | ORAL_TABLET | Freq: Two times a day (BID) | ORAL | Status: DC
  Administered 2011-09-19: 80 mg via ORAL
  Filled 2011-09-19: qty 1

## 2011-09-19 MED ORDER — DOCUSATE SODIUM 50 MG PO CAPS
50.0000 mg | ORAL_CAPSULE | ORAL | Status: DC
  Filled 2011-09-19: qty 1

## 2011-09-19 MED ORDER — DULOXETINE HCL 30 MG PO CPEP
30.0000 mg | ORAL_CAPSULE | Freq: Every day | ORAL | Status: DC
  Filled 2011-09-19: qty 1

## 2011-09-19 MED ORDER — ALPRAZOLAM 1 MG PO TABS
1.0000 mg | ORAL_TABLET | Freq: Three times a day (TID) | ORAL | Status: DC | PRN
  Administered 2011-09-19 – 2011-09-20 (×2): 1 mg via ORAL
  Filled 2011-09-19 (×2): qty 1

## 2011-09-19 MED ORDER — FENTANYL CITRATE 0.05 MG/ML IJ SOLN
INTRAMUSCULAR | Status: AC
  Filled 2011-09-19: qty 2

## 2011-09-19 MED ORDER — DOCUSATE SODIUM 100 MG PO CAPS
100.0000 mg | ORAL_CAPSULE | Freq: Two times a day (BID) | ORAL | Status: DC
  Administered 2011-09-19 – 2011-09-21 (×4): 100 mg via ORAL
  Filled 2011-09-19 (×5): qty 1

## 2011-09-19 MED ORDER — ATORVASTATIN CALCIUM 20 MG PO TABS
20.0000 mg | ORAL_TABLET | Freq: Every day | ORAL | Status: DC
  Administered 2011-09-19: 20 mg via ORAL
  Filled 2011-09-19: qty 1

## 2011-09-19 MED ORDER — PROPOFOL 10 MG/ML IV EMUL
INTRAVENOUS | Status: DC | PRN
  Administered 2011-09-19: 20 mg via INTRAVENOUS
  Administered 2011-09-19: 70 mg via INTRAVENOUS

## 2011-09-19 MED ORDER — POTASSIUM CHLORIDE CRYS ER 20 MEQ PO TBCR
20.0000 meq | EXTENDED_RELEASE_TABLET | Freq: Three times a day (TID) | ORAL | Status: DC
  Administered 2011-09-19 (×2): 20 meq via ORAL
  Filled 2011-09-19: qty 1

## 2011-09-19 MED ORDER — CEFAZOLIN SODIUM-DEXTROSE 2-3 GM-% IV SOLR
2.0000 g | Freq: Once | INTRAVENOUS | Status: AC
  Administered 2011-09-19: 2 g via INTRAVENOUS

## 2011-09-19 MED ORDER — FENTANYL CITRATE 0.05 MG/ML IJ SOLN
25.0000 ug | INTRAMUSCULAR | Status: DC | PRN
  Administered 2011-09-19: 25 ug via INTRAVENOUS
  Administered 2011-09-19: 50 ug via INTRAVENOUS
  Administered 2011-09-19: 25 ug via INTRAVENOUS
  Administered 2011-09-19: 50 ug via INTRAVENOUS
  Administered 2011-09-19: 25 ug via INTRAVENOUS

## 2011-09-19 MED ORDER — PROMETHAZINE HCL 25 MG/ML IJ SOLN: 6.2500 mg | INTRAMUSCULAR | Status: DC | PRN

## 2011-09-19 MED ORDER — GLYCOPYRROLATE 0.2 MG/ML IJ SOLN
INTRAMUSCULAR | Status: DC | PRN
  Administered 2011-09-19: 0.6 mg via INTRAVENOUS

## 2011-09-19 MED ORDER — DEXTROSE 5 % IV SOLN: 1.5000 g | Freq: Once | INTRAVENOUS | Status: DC

## 2011-09-19 MED ORDER — PHENOL 1.4 % MT LIQD
1.0000 | OROMUCOSAL | Status: DC | PRN
  Filled 2011-09-19: qty 177

## 2011-09-19 MED ORDER — ONDANSETRON HCL 4 MG/2ML IJ SOLN
INTRAMUSCULAR | Status: DC | PRN
  Administered 2011-09-19: 4 mg via INTRAVENOUS

## 2011-09-19 MED ORDER — HYDROCODONE-ACETAMINOPHEN 5-325 MG PO TABS
1.0000 | ORAL_TABLET | ORAL | Status: DC | PRN
  Administered 2011-09-20 (×2): 1 via ORAL
  Administered 2011-09-21: 2 via ORAL
  Filled 2011-09-19 (×2): qty 1
  Filled 2011-09-19: qty 2

## 2011-09-19 MED ORDER — PHENYLEPHRINE HCL 10 MG/ML IJ SOLN
10.0000 mg | INTRAVENOUS | Status: DC | PRN
  Administered 2011-09-19: 50 ug/min via INTRAVENOUS

## 2011-09-19 MED ORDER — INSULIN ASPART 100 UNIT/ML ~~LOC~~ SOLN
0.0000 [IU] | Freq: Three times a day (TID) | SUBCUTANEOUS | Status: DC
  Administered 2011-09-19: 5 [IU] via SUBCUTANEOUS

## 2011-09-19 MED ORDER — FERROUS GLUCONATE 324 (38 FE) MG PO TABS
324.0000 mg | ORAL_TABLET | Freq: Two times a day (BID) | ORAL | Status: DC
  Administered 2011-09-19: 324 mg via ORAL
  Filled 2011-09-19: qty 1

## 2011-09-19 MED ORDER — GABAPENTIN 300 MG PO CAPS
300.0000 mg | ORAL_CAPSULE | Freq: Two times a day (BID) | ORAL | Status: DC
  Administered 2011-09-19: 300 mg via ORAL
  Filled 2011-09-19: qty 1

## 2011-09-19 MED ORDER — NEOSTIGMINE METHYLSULFATE 1 MG/ML IJ SOLN
INTRAMUSCULAR | Status: DC | PRN
  Administered 2011-09-19: 5 mg via INTRAVENOUS

## 2011-09-19 MED ORDER — PHENYLEPHRINE HCL 10 MG/ML IJ SOLN
INTRAMUSCULAR | Status: DC | PRN
  Administered 2011-09-19 (×2): 40 ug via INTRAVENOUS

## 2011-09-19 MED ORDER — ROCURONIUM BROMIDE 100 MG/10ML IV SOLN
INTRAVENOUS | Status: DC | PRN
  Administered 2011-09-19: 10 mg via INTRAVENOUS
  Administered 2011-09-19: 35 mg via INTRAVENOUS

## 2011-09-19 MED ORDER — SENNOSIDES-DOCUSATE SODIUM 8.6-50 MG PO TABS
1.0000 | ORAL_TABLET | Freq: Every day | ORAL | Status: DC | PRN
  Administered 2011-09-19: 1 via ORAL
  Filled 2011-09-19: qty 1

## 2011-09-19 MED ORDER — INSULIN ASPART 100 UNIT/ML ~~LOC~~ SOLN: 0.0000 [IU] | Freq: Every day | SUBCUTANEOUS | Status: DC

## 2011-09-19 MED ORDER — LACTATED RINGERS IV SOLN
INTRAVENOUS | Status: DC | PRN
  Administered 2011-09-19: 11:00:00 via INTRAVENOUS

## 2011-09-19 MED ORDER — ALPRAZOLAM 1 MG PO TABS
1.0000 mg | ORAL_TABLET | Freq: Three times a day (TID) | ORAL | Status: DC | PRN
  Filled 2011-09-19: qty 1

## 2011-09-19 NOTE — Anesthesia Postprocedure Evaluation (Signed)
  Anesthesia Post-op Note  Patient: [name]  Procedure(s) Performed: Procedure(s) (LRB): RADIO FREQUENCY ABLATION (N/A)  Patient Location: PACU  Anesthesia Type: General  Level of Consciousness: awake and alert   Airway and Oxygen Therapy: Patient Spontanous Breathing  Post-op Pain: mild  Post-op Assessment: Post-op Vital signs reviewed, Patient's Cardiovascular Status Stable, Respiratory Function Stable, Patent Airway and No signs of Nausea or vomiting  Post-op Vital Signs: stable  Complications: No apparent anesthesia complications

## 2011-09-19 NOTE — H&P (Signed)
Chief Complaint: (L)renal mass Referring Physician:Dahlstedt HPI: [name] is an 61 y.o. female who is found to have  a left renal mass while being worked up by Dr. Lovett Calender for  anema. Abdominal ultrasound on 07/01/11 demonstrated a probable 2.7  cm solid left renal mass. MRI on 07/10/11 showed a roughly 2.7 cm  solid, enhancing mass emanating from the posterior lower pole  cortex of the left kidney. The patient states that she has had  some periodic left flank discomfort. She had one episode of pink  tinged urine back in May but has not had any hematuria since. The  patient has no history of malignancy or prior renal surgery.  She was seen by [name] in consultation and after long discussion of treatment options, has agreed to cryoablation procedure. She is scheduled today. She otherwise feels ok. She does report some nausea, which she attributes to nerves. She did take her Prilosec this am. She denies fever, CP, abd pain. She is on chronic [place] O2 due to COPD.  Past Medical History:  Past Medical History  Diagnosis Date  . Depression   . GERD (gastroesophageal reflux disease)     Followed by [name] // small bowel capsule endoscopy (11/2009) - minimal chronic inflammation, otherwise negative for  H.pylori, metaplasia, dysplasia  . Hypertension   . Hyperlipidemia   . Diabetes mellitus type II, uncontrolled     on insulin therapy  . COPD (chronic obstructive pulmonary disease) 2008    started home O2 (12/2010) // Prior significant smoker  // her CT of the chest there was no evidence of primary carcinoid tumor , scattered normal-sized mediastinal and hilar as well as axillary lymph nodes noted., no significant lymphadenopathy. COPD and emphysema with no acute cardiopulmonary disease noted , mild cardiomegaly and solitary calcified  plaque  . Moderate to severe pulmonary hypertension     severe per TEE (08/2010) - Peak RV-RA gradient 64m Hg  . Mitral regurgitation     mild  to moderate per TEE (08/2010)  . Shingles   . Anemia     Anemia panel (08/2009) - iron 21, TIBC 441, UIBC 420,  ferritin 11 // Pt has been unable to tolerate iron supplementation due to chronic abdominal discomfort, source of which still is not clear. // Repeat EGD (05/2011) negative, colonoscopy (05/2011) showing only int hemorrhoids, no specific cause of anemia  . History of colonic polyps     Colonoscopy (10/2005) - colon polyp with path nonadenomatous, no malignancy - by Dr. [name] // Colonoscopy (02/2002) - 2 polyps noted, 1 - adenomatous, 2 - hyperplastic // Followed by Dr. [name]// Repeat Colonoscopy (05/2011) - single tubular adenoma polyp - performed by Dr. [name] . Anxiety   . IBS (irritable bowel syndrome)   . Osteoporosis     DEXA (12/2004) - L-spine T score -2.6, Left hip -0.1  . Fibromyalgia   . Obstructive sleep apnea     on CPAP. // Sleep study (06/2009) - Moderate sleep apnea/ hypopnea syndrome , AHI 17.8 per hour with nonpositional hypopneas. CPAP titration to 12 CWP, AHI 2.4 per hour. Small resMed Quattro full-face mask with heated humidifier  . Restless leg syndrome   . Mitral stenosis     moderate per TEE (08/2010) // Mitral valve replacement with a 27-mm pericardial porcine valve (Medtronic Mosaic valve, serial ##[id number] on 09/20/2010 - PIvin Poot// [facility] [name] . Bilateral carotid artery stenosis  s/p right endarterectomy (06/2010). // Carotid US (07/2010) -  60%-79% ICA stenosis, mid range of scale by velocity.  Left: Moderate-to-severe calcific and non-calcific plaque origin andproximal ICA and ECA.  60%-79% ICA stenosis, highest end of scale // Followed by [name].  . Peripheral vascular disease   . Coronary artery disease   . Internal hemorrhoids     small internal hemorrhoids noted on colonoscopy 05/2011  . Renal mass, left 07/21/2011    MRI Abdomen (07/2011) - 2.7 x 2.7 x 3.0 cm enhancing solid renal mass posterior  aspect of the lower pole of the left kidney. // CT Abdomen in 09/2010 showed no evidence of mass // Followed by [name]  [facility]) --> pending evaluation by [name] for consideration of cryoablation  . Irregular heart beat   . CHF (congestive heart failure)   . Cancer   . History of blood transfusion     Past Surgical History:  Past Surgical History  Procedure Date  . Orif clavicle fracture 01/2004    by Thana Farr. [name], M.D for Right clavicle nonunion.  . Tonsillectomy   . Carotid endarterectomy 07/04/2010    right, by [name] for asymptomatic right carotid artery stenosis  . Lipoma exision 08/2005    occipital lipoma 1.5cm - by Dr. [name]  . Hysteroscopy with endometrial ablation 06/2001    for persistent post-menopausal bleeding // by S. [name], M.D.  . Mitral valve replacement 09/20/10     with a 27-mm pericardial porcine valve (Medtronic Mosaic valve, serial #[id number]). 09/20/10, Dr Prescott Gum  . Left atrial maze procedure 09/20/10    for paroxysmal atrial fibrillation (Dr. Prescott Gum)  . Placement of left chest tube. 09/24/2010    Dr Prescott Gum  . Pr vein bypass graft,aorto-fem-pop May 2012  . Cardiac valve replacement Aug. 2012  . Colonoscopy 05/12/2011    performed by Dr. Michail Sermon. Showing small internal hemorrhoids, single tubular adenoma polyp  . Esophagogastroduodenoscopy 05/12/2011    performed by Dr. Michail Sermon. Negative for ulcerations, biopsy negative for evidence of celiac sprue  . Tubal ligation     Family History:  Family History  Problem Relation Age of Onset  . Heart disease Father 74    died to MI at 36yo  . Heart attack Father   . Heart disease Brother 34    died of MI at 31yo  . Heart attack Brother     Social History:  reports that she quit smoking about 14 months ago. Her smoking use included Cigarettes. She has a 50 pack-year smoking history. She does not have any smokeless tobacco history on file. She reports that she does not  drink alcohol or use illicit drugs.  Allergies:  Allergies  Allergen Reactions  . Lorazepam     Patient's sister noted that ativan caused the patient to become extremely confused during hospitalization 09/2010  . Morphine     REACTION: Unknown reaction  . Percocet (Oxycodone-Acetaminophen)     headache  . Tramadol Hcl     REACTION: swelling    Medications: albuterol (PROVENTIL HFA;VENTOLIN HFA) 108 (90 BASE) MCG/ACT inhaler (Taking) Sig - Route: Inhale 2 puffs into the lungs every 6 (six) hours as needed. For breathing - Inhalation Class: Historical Med Number of times this order has been changed since signing: 2 Order [address] ALPRAZolam (XANAX) 1 MG tablet (Taking) 90 tablet 5 05/29/2011 Sig - Route: Take 1 tablet (1 mg total) by mouth 3 (three) times daily as needed  for sleep or anxiety. - Oral Class: Phone In Number of times this order has been changed since signing: 1 [address] aspirin 81 MG tablet (Taking) Sig - Route: Take 81 mg by mouth daily with supper. - Oral Class: Historical Med Number of times this order has been changed since signing: 6 Order [address] docusate sodium (COLACE) 50 MG capsule (Taking) Sig - Route: Take by mouth every other day. Alternate with senokot - Oral Class: Historical Med Number of times this order has been changed since signing: 1 Order [address] Elastic Bandages & Supports (TRUFORM STOCKINGS 20-30MMHG) MISC (Taking) 2 each 1 05/29/2011 Sig: Wear compression stockings throughout the day, and take off at nighttime. Number of times this order has been changed since signing: 1 Order [address] ferrous gluconate (FERGON) 324 MG tablet (Taking) 120 tablet 3 05/13/2011 05/12/2012 Sig - Route: Take 1 tablet (324 mg total) by mouth 2 (two) times daily with a meal. - Oral Number of times this order has been changed since signing: 2 Order [address] Fluticasone-Salmeterol (ADVAIR DISKUS) 250-50 MCG/DOSE AEPB (Taking) 1 each 11 12/27/2010 Sig - Route: Inhale 1  puff into the lungs every 12 (twelve) hours. - Inhalation Number of times this order has been changed since signing: 3 Order [address] furosemide (LASIX) 80 MG tablet (Taking) 60 tablet 12 10/28/2010 Sig - Route: Take 1 tablet (80 mg total) by mouth 2 (two) times daily. - Oral Number of times this order has been changed since signing: 3 Order [address] gabapentin (NEURONTIN) 300 MG capsule (Taking) 60 capsule 5 08/25/2011 08/24/2012 Sig - Route: Take 1 capsule (300 mg total) by mouth 2 (two) times daily. - Oral Number of times this order has been changed since signing: 2 Order [address] HYDROcodone-acetaminophen (NORCO) 5-325 MG per tablet (Taking) 240 tablet 1 08/25/2011 Sig: 1-2 tablets every 6 hours as needed for pain. Do not take with alcohol, do no drive or operate heavy machinery if drowsiness occurs. Do not take more than instructed. Class: Phone In insulin detemir (LEVEMIR) 100 UNIT/ML injection (Taking) 20 mL 5 08/25/2011 08/24/2012 Sig - Route: Inject 50 Units into the skin at bedtime. DISPENSE 2 VIALS/ MONTH - Subcutaneous Insulin Pen Needle (B-D UF III MINI PEN NEEDLES) 31G X 5 MM MISC (Taking) Sig - Route: by Does not apply route 3 (three) times daily. - Does not apply Class: Historical Med Number of times this order has been changed since signing: 1 Order [address] Insulin Syringe-Needle U-100 (BD INSULIN SYRINGE ULTRAFINE) 31G X 5/16" 1 ML MISC (Taking) Sig - Route: by Does not apply route every morning. - Does not apply Class: Historical Med Number of times this order has been changed since signing: 1 Order [address] Insulin Syringe-Needle U-100 (INSULIN SYRINGE .3CC/31GX5/16") 31G X 5/16" 0.3 ML MISC (Taking) 100 each 11 08/20/2011 Sig - Route: 1 Syringe by Does not apply route 2 (two) times daily at 10 AM and 5 PM. - Does not apply omeprazole (PRILOSEC) 40 MG capsule (Taking) Sig - Route: Take 40 mg by mouth 2 (two) times daily. - Oral Class: Historical Med Number of times this order has been  changed since signing: 5 Order [address] potassium chloride SA (K-DUR,KLOR-CON) 20 MEQ tablet (Taking) 90 tablet 12 11/05/2010 Sig - Route: Take 1 tablet (20 mEq total) by mouth 3 (three) times daily. - Oral Number of times this order has been changed since signing: 3 Order [address] buPROPion (WELLBUTRIN XL) 150 MG 24  hr tablet (Taking/Discontinued) 30 tablet 5 05/29/2011 09/15/2011 Sig - Route: Take 1 tablet (150 mg total) by mouth at bedtime.   Please HPI for pertinent positives, otherwise complete 10 system ROS negative.  Physical Exam: Blood pressure 123/55, pulse 78, temperature 98.1 F (36.7 C), resp. rate 18, SpO2 88.00%. There is no height or weight on file to calculate BMI.   General Appearance:  Alert, cooperative, no distress, appears stated age  Head:  Normocephalic, without obvious abnormality, atraumatic  ENT: Unremarkable  Neck: Supple, symmetrical, trachea midline, no adenopathy, thyroid: not enlarged, symmetric, no tenderness/mass/nodules  Lungs:   Clear to auscultation bilaterally, no w/r/r, respirations unlabored without use of accessory muscles.  Heart:  Regular rate and rhythm, S1, S2 normal, no murmur, rub or gallop. Carotids 2+ without bruit.  Abdomen:   Soft, non-tender, non distended. Bowel sounds active all four quadrants,  no masses, no organomegaly. No CVAT  Extremities: Extremities normal, atraumatic, no cyanosis or edema  Neurologic: Normal affect, no gross deficits.   Results for orders placed during the hospital encounter of 09/19/11 (from the past 48 hour(s))  GLUCOSE, CAPILLARY     Status: Abnormal   Collection Time   09/19/11  9:51 AM      Component Value Range Comment   Glucose-Capillary 275 (*) 70 - 99 mg/dL    CBC    Component Value Date/Time   WBC 6.6 09/15/2011 1045   RBC 4.22 09/15/2011 1045   HGB 12.6 09/15/2011 1045   HCT 38.5 09/15/2011 1045   PLT 194 09/15/2011 1045   BMET    Component Value Date/Time   NA 137 09/15/2011 1045   K 4.0 09/15/2011  1045   CL 97 09/15/2011 1045   CO2 28 09/15/2011 1045   GLUCOSE 295* 09/15/2011 1045   BUN 19 09/15/2011 1045   CREATININE 0.80 09/15/2011 1045   CALCIUM 9.5 09/15/2011 1045   GFRNONAA 77* 09/15/2011 1045   GFRAA 90* 09/15/2011 1045   Prothrombin Time/INR  PTT 13.6/1.02    32    Assessment/Plan (L)renal mass suspicious for renal carcinoma Proceed with (L)renal mass cryoablation and possible biopsy today with general anesthesia. Procedure reviewed with pt by myself and [name] including risks and complications. Planning 23hr obs admission. Labs all ok. Consent signed in chart  Ascencion Dike PA-C 09/19/2011, 10:44 AM

## 2011-09-19 NOTE — Anesthesia Preprocedure Evaluation (Addendum)
Anesthesia Evaluation  Patient identified by MRN, date of birth, ID band Patient awake    Reviewed: Allergy & Precautions, H&P , NPO status , Patient's Chart, lab work & pertinent test results  Airway Mallampati: II TM Distance: >3 FB Neck ROM: Full    Dental No notable dental hx.    Pulmonary shortness of breath, sleep apnea and Continuous Positive Airway Pressure Ventilation , COPD COPD inhaler and oxygen dependent,  Oxygen one liter/min 24 hours /day. breath sounds clear to auscultation  Pulmonary exam normal       Cardiovascular hypertension, + CAD and +CHF + dysrhythmias Rhythm:Regular Rate:Normal  ECG reviewed: SR with first degree AV block, pulmonary disease pattern, iRBBB  H/O pulmonary HTN.  Reviewed Dr. Jacalyn Lefevre note of 06/24/11. S/P MVR August 2012. H/O A. Fib.   Neuro/Psych PSYCHIATRIC DISORDERS Anxiety Depression  Neuromuscular disease negative neurological ROS     GI/Hepatic negative GI ROS, Neg liver ROS, GERD-  ,  Endo/Other  Type 1, Insulin Dependent  Renal/GU negative Renal ROS  negative genitourinary   Musculoskeletal negative musculoskeletal ROS (+) Fibromyalgia -  Abdominal (+) + obese,   Peds negative pediatric ROS (+)  Hematology negative hematology ROS (+)   Anesthesia Other Findings   Reproductive/Obstetrics negative OB ROS                        Anesthesia Physical Anesthesia Plan  ASA: IV  Anesthesia Plan: General   Post-op Pain Management:    Induction: Intravenous  Airway Management Planned: Oral ETT  Additional Equipment:   Intra-op Plan:   Post-operative Plan: Extubation in OR  Informed Consent: I have reviewed the patients History and Physical, chart, labs and discussed the procedure including the risks, benefits and alternatives for the proposed anesthesia with the patient or authorized representative who has indicated his/her understanding and  acceptance.   Dental advisory given  Plan Discussed with: CRNA  Anesthesia Plan Comments:        Anesthesia Quick Evaluation

## 2011-09-19 NOTE — Procedures (Signed)
Procedure:  CT guided core biopsy and perc cryoablation of left renal mass Anesthesia:  General Findings:  Left LP renal mass bx with 18 G cores x 2 Perc cryo with 4 Galil Ice Rod Plus probes.  10 min freeze cycles x 2. Plan:  Overnight observation

## 2011-09-19 NOTE — Transfer of Care (Signed)
Immediate Anesthesia Transfer of Care Note  Patient: [name]  Procedure(s) Performed: Procedure(s) (LRB): RADIO FREQUENCY ABLATION (N/A)  Patient Location: PACU  Anesthesia Type: General  Level of Consciousness: sedated, patient cooperative and responds to stimulaton  Airway & Oxygen Therapy: Patient Spontanous Breathing and Patient connected to face mask oxgen  Post-op Assessment: Report given to PACU RN and Post -op Vital signs reviewed and stable  Post vital signs: Reviewed and stable  Complications: No apparent anesthesia complications

## 2011-09-19 NOTE — H&P (Signed)
For cryoablation of left renal tumor today.

## 2011-09-19 NOTE — Progress Notes (Signed)
  Subjective: Status post cryoablation and biopsy of left renal mass.  5/10 pain LLQ.  No N/V.  Urine bloody.  Objective: Vital signs in last 24 hours: Temp:  [97.4 F (36.3 C)-98.1 F (36.7 C)] 97.5 F (36.4 C) (08/09 1606) Pulse Rate:  [78-108] 94  (08/09 1606) Resp:  [16-18] 18  (08/09 1606) BP: (104-162)/(55-87) 112/65 mmHg (08/09 1606) SpO2:  [86 %-100 %] 89 % (08/09 1606) Weight:  [190 lb 9.6 oz (86.456 kg)] 190 lb 9.6 oz (86.456 kg) (08/09 1529)    Intake/Output from previous day:   Intake/Output this shift:    Abdomen:  Soft.  Mild tenderness LLQ.  Lab Results:  No results found for this basename: WBC:2,HGB:2,HCT:2,PLT:2 in the last 72 hours BMET No results found for this basename: NA:2,K:2,CL:2,CO2:2,GLUCOSE:2,BUN:2,CREATININE:2,CALCIUM:2 in the last 72 hours PT/INR No results found for this basename: LABPROT:2,INR:2 in the last 72 hours ABG No results found for this basename: PHART:2,PCO2:2,PO2:2,HCO3:2 in the last 72 hours  Studies/Results: No results found.  Anti-infectives: Anti-infectives     Start     Dose/Rate Route Frequency Ordered Stop   09/19/11 1130   ceFAZolin (ANCEF) 1.5 g in dextrose 5 % 50 mL IVPB  Status:  Discontinued        1.5 g 130 mL/hr over 30 Minutes Intravenous  Once 09/19/11 0717 09/19/11 0718   09/19/11 1130   ceFAZolin (ANCEF) IVPB 2 g/50 mL premix        2 g 100 mL/hr over 30 Minutes Intravenous  Once 09/19/11 0718 09/19/11 1122   09/19/11 1006   ceFAZolin (ANCEF) 2-3 GM-% IVPB SOLR     Comments: DUININCK, STACEY: cabinet override         09/19/11 1006 09/19/11 2214          Assessment/Plan: s/p cryoablation of left renal tumor.  Due to hematuria, will keep Foley in overnight.  Check labs in AM.  Pain control.    LOS: 0 days    YAMAGATA,GLENN T 09/19/2011

## 2011-09-20 LAB — BASIC METABOLIC PANEL
BUN: 23 mg/dL (ref 6–23)
CO2: 28 mEq/L (ref 19–32)
Calcium: 8.8 mg/dL (ref 8.4–10.5)
Chloride: 95 mEq/L — ABNORMAL LOW (ref 96–112)
Creatinine, Ser: 1.2 mg/dL — ABNORMAL HIGH (ref 0.50–1.10)
GFR calc Af Amer: 55 mL/min — ABNORMAL LOW (ref >90)
GFR calc non Af Amer: 47 mL/min — ABNORMAL LOW (ref >90)
Glucose, Bld: 197 mg/dL — ABNORMAL HIGH (ref 70–99)
Potassium: 3.7 mEq/L (ref 3.5–5.1)
Sodium: 133 mEq/L — ABNORMAL LOW (ref 135–145)

## 2011-09-20 LAB — CBC
HCT: 33.6 % — ABNORMAL LOW (ref 36.0–46.0)
Hemoglobin: 11.1 g/dL — ABNORMAL LOW (ref 12.0–15.0)
MCH: 30.2 pg (ref 26.0–34.0)
MCHC: 33 g/dL (ref 30.0–36.0)
MCV: 91.6 fL (ref 78.0–100.0)
Platelets: 186 10*3/uL (ref 150–400)
RBC: 3.67 MIL/uL — ABNORMAL LOW (ref 3.87–5.11)
RDW: 14.1 % (ref 11.5–15.5)
WBC: 10.5 10*3/uL (ref 4.0–10.5)

## 2011-09-20 LAB — GLUCOSE, CAPILLARY
Glucose-Capillary: 224 mg/dL — ABNORMAL HIGH (ref 70–99)
Glucose-Capillary: 183 mg/dL — ABNORMAL HIGH (ref 70–99)
Glucose-Capillary: 353 mg/dL — ABNORMAL HIGH (ref 70–99)

## 2011-09-20 MED ORDER — TIOTROPIUM BROMIDE MONOHYDRATE 18 MCG IN CAPS: 18.0000 ug | ORAL_CAPSULE | Freq: Every day | RESPIRATORY_TRACT | Status: DC

## 2011-09-20 MED ORDER — SODIUM CHLORIDE 0.9 % IV SOLN: INTRAVENOUS | Status: DC

## 2011-09-20 MED ORDER — SODIUM CHLORIDE 0.45 % IV SOLN
INTRAVENOUS | Status: DC
  Administered 2011-09-20: 12:00:00 via INTRAVENOUS

## 2011-09-20 MED ORDER — DULOXETINE HCL 30 MG PO CPEP
30.0000 mg | ORAL_CAPSULE | Freq: Every day | ORAL | Status: DC
  Administered 2011-09-20: 30 mg via ORAL
  Filled 2011-09-20 (×2): qty 1

## 2011-09-20 MED ORDER — ALBUTEROL SULFATE HFA 108 (90 BASE) MCG/ACT IN AERS
2.0000 | INHALATION_SPRAY | Freq: Four times a day (QID) | RESPIRATORY_TRACT | Status: DC | PRN
  Filled 2011-09-20: qty 6.7

## 2011-09-20 MED ORDER — FLUTICASONE-SALMETEROL 250-50 MCG/DOSE IN AEPB: 1.0000 | INHALATION_SPRAY | Freq: Two times a day (BID) | RESPIRATORY_TRACT | Status: DC

## 2011-09-20 MED ORDER — BUPROPION HCL ER (XL) 150 MG PO TB24
150.0000 mg | ORAL_TABLET | Freq: Every day | ORAL | Status: DC
  Administered 2011-09-20: 150 mg via ORAL
  Filled 2011-09-20 (×2): qty 1

## 2011-09-20 MED ORDER — INSULIN DETEMIR 100 UNIT/ML ~~LOC~~ SOLN
50.0000 [IU] | Freq: Every day | SUBCUTANEOUS | Status: DC
  Administered 2011-09-20: 50 [IU] via SUBCUTANEOUS
  Filled 2011-09-20 (×9): qty 10

## 2011-09-20 MED ORDER — DULOXETINE HCL 60 MG PO CPEP
90.0000 mg | ORAL_CAPSULE | Freq: Every day | ORAL | Status: DC
  Administered 2011-09-21: 90 mg via ORAL
  Filled 2011-09-20 (×2): qty 1

## 2011-09-20 MED ORDER — FERROUS GLUCONATE 324 (38 FE) MG PO TABS
324.0000 mg | ORAL_TABLET | Freq: Two times a day (BID) | ORAL | Status: DC
  Administered 2011-09-20 – 2011-09-21 (×3): 324 mg via ORAL
  Filled 2011-09-20 (×5): qty 1

## 2011-09-20 MED ORDER — ATORVASTATIN CALCIUM 20 MG PO TABS
20.0000 mg | ORAL_TABLET | Freq: Every day | ORAL | Status: DC
  Administered 2011-09-20: 20 mg via ORAL
  Filled 2011-09-20 (×2): qty 1

## 2011-09-20 MED ORDER — GABAPENTIN 300 MG PO CAPS
300.0000 mg | ORAL_CAPSULE | Freq: Two times a day (BID) | ORAL | Status: DC
  Administered 2011-09-20 – 2011-09-21 (×3): 300 mg via ORAL
  Filled 2011-09-20 (×4): qty 1

## 2011-09-20 MED ORDER — PANTOPRAZOLE SODIUM 40 MG PO TBEC
40.0000 mg | DELAYED_RELEASE_TABLET | Freq: Every day | ORAL | Status: DC
  Administered 2011-09-20: 40 mg via ORAL
  Filled 2011-09-20: qty 1

## 2011-09-20 MED ORDER — POTASSIUM CHLORIDE CRYS ER 20 MEQ PO TBCR
20.0000 meq | EXTENDED_RELEASE_TABLET | Freq: Three times a day (TID) | ORAL | Status: DC
  Administered 2011-09-20 – 2011-09-21 (×4): 20 meq via ORAL
  Filled 2011-09-20 (×6): qty 1

## 2011-09-20 MED ORDER — LORATADINE 10 MG PO TABS
10.0000 mg | ORAL_TABLET | Freq: Every day | ORAL | Status: DC
  Administered 2011-09-20: 10 mg via ORAL
  Filled 2011-09-20 (×2): qty 1

## 2011-09-20 MED ORDER — FUROSEMIDE 80 MG PO TABS
80.0000 mg | ORAL_TABLET | Freq: Two times a day (BID) | ORAL | Status: DC
  Administered 2011-09-20 – 2011-09-21 (×3): 80 mg via ORAL
  Filled 2011-09-20 (×4): qty 1

## 2011-09-20 MED ORDER — SENNOSIDES-DOCUSATE SODIUM 8.6-50 MG PO TABS
1.0000 | ORAL_TABLET | ORAL | Status: DC
Start: 2011-09-21 — End: 2011-09-21
  Administered 2011-09-21: 1 via ORAL
  Filled 2011-09-20: qty 1

## 2011-09-20 MED ORDER — DOCUSATE SODIUM 50 MG PO CAPS: 50.0000 mg | ORAL_CAPSULE | ORAL | Status: DC

## 2011-09-20 NOTE — Progress Notes (Signed)
Patient administered her own Advair.

## 2011-09-20 NOTE — Progress Notes (Signed)
1 Day Post-Op  Subjective: Patient had "rough night"; unable to get much sleep; on CPAP now; occ cough and mild to mod left flank discomfort; blood tinged urine in Foley  Objective: Vital signs in last 24 hours: Temp:  [97.3 F (36.3 C)-98.1 F (36.7 C)] 98.1 F (36.7 C) (08/10 0546) Pulse Rate:  [78-108] 91  (08/10 0546) Resp:  [16-20] 18  (08/10 0546) BP: (90-162)/(55-87) 101/62 mmHg (08/10 0546) SpO2:  [81 %-100 %] 97 % (08/10 0546) Weight:  [190 lb 9.6 oz (86.456 kg)] 190 lb 9.6 oz (86.456 kg) (08/09 1529) Last BM Date: 09/18/11  Intake/Output from previous day: 08/09 0701 - 08/10 0700 In: 2315.5 [P.O.:960; I.V.:982; IV Piggyback:2] Out: 77 [Urine:850] Intake/Output this shift: Total I/O In: 240 [P.O.:240] Out: -   Pt awake/alert; chest-distant BS with decreased BS left base, heart- RRR, abd- obese, soft, +BS, mild-mod tender left flank with rad to left ant abd region; puncture site left flank clean and dry; ext- FROM , no edema  Lab Results:   [facility] 09/20/11 0430 09/19/11 1635  WBC 10.5 16.3*  HGB 11.1* 12.0  HCT 33.6* 36.7  PLT 186 199   BMET  Basename 09/20/11 0430 09/19/11 1635  NA 133* 136  K 3.7 4.0  CL 95* 98  CO2 28 30  GLUCOSE 197* 279*  BUN 23 23  CREATININE 1.20* 0.97  CALCIUM 8.8 9.1   PT/INR  Basename 09/19/11 1635  LABPROT 13.6  INR 1.02   ABG No results found for this basename: PHART:2,PCO2:2,PO2:2,HCO3:2 in the last 72 hours  Studies/Results: Ct Guide Tissue Ablation  09/19/2011  *RADIOLOGY REPORT*  Clinical Data:  2.7 cm left renal mass.  The patient presents for cryoablation and biopsy.  CT-GUIDED CORE BIOPSY OF LEFT RENAL MASS. CT-GUIDED PERCUTANEOUS CRYOABLATION OF LEFT RENAL MASS.  Anesthesia:  General  Medications:  2 grams IV Ancef. As antibiotic prophylaxis, Ancef was ordered pre-procedure and administered intravenously within one hour of incision.  Procedure:  The procedure, risks, benefits, and alternatives were explained to  the patient.  Questions regarding the procedure were encouraged and answered.  The patient understands and consents to the procedure.  The patient was placed under general anesthesia.  Initial unenhanced CT was performed in a prone position to localize the left kidney.  The left flank region was prepped with Betadine in a sterile fashion, and a sterile drape was applied covering the operative field.  A sterile gown and sterile gloves were used for the procedure.  A 17 gauge trocar needle was advanced into the left renal mass. Core biopsy was performed with an 18 gauge automated core biopsy device.  A total of 2 core samples were submitted in formalin for pathologic analysis.  Under CT guidance, a total of four Galil Ice Rod Plus percutaneous cryoablation probes were advanced into the left renal mass.  Probe positioning was confirmed by CT prior to cryoablation.  Cryoablation was performed through the four probes simultaneously. Initial 10 minute cycle of cryoablation was performed.  This was followed by a 8 minute thaw cycle.  A second 10 minute cycle of cryoablation was then performed.  During ablation, periodic CT imaging was performed to monitor ice ball formation and morphology. After active thaw, the cryoablation probes were removed.  Post-procedural CT was performed.  Complications: None  Findings:  By initial CT, maximal renal tumor diameter was approximately 2.8 cm.  After insertion of the probes, cryoablation resulted in a uniform ice ball that completely encompasses the tumor.  IMPRESSION:  CT guided percutaneous core biopsy and cryoablation of left renal mass.  The patient will be observed overnight.  Initial follow-up will be performed in approximately 4 weeks.  Original Report Authenticated By: Azzie Roup, M.D.   Ct Biopsy  09/19/2011  *RADIOLOGY REPORT*  Clinical Data:  2.7 cm left renal mass.  The patient presents for cryoablation and biopsy.  CT-GUIDED CORE BIOPSY OF LEFT RENAL MASS.  CT-GUIDED PERCUTANEOUS CRYOABLATION OF LEFT RENAL MASS.  Anesthesia:  General  Medications:  2 grams IV Ancef. As antibiotic prophylaxis, Ancef was ordered pre-procedure and administered intravenously within one hour of incision.  Procedure:  The procedure, risks, benefits, and alternatives were explained to the patient.  Questions regarding the procedure were encouraged and answered.  The patient understands and consents to the procedure.  The patient was placed under general anesthesia.  Initial unenhanced CT was performed in a prone position to localize the left kidney.  The left flank region was prepped with Betadine in a sterile fashion, and a sterile drape was applied covering the operative field.  A sterile gown and sterile gloves were used for the procedure.  A 17 gauge trocar needle was advanced into the left renal mass. Core biopsy was performed with an 18 gauge automated core biopsy device.  A total of 2 core samples were submitted in formalin for pathologic analysis.  Under CT guidance, a total of four Galil Ice Rod Plus percutaneous cryoablation probes were advanced into the left renal mass.  Probe positioning was confirmed by CT prior to cryoablation.  Cryoablation was performed through the four probes simultaneously. Initial 10 minute cycle of cryoablation was performed.  This was followed by a 8 minute thaw cycle.  A second 10 minute cycle of cryoablation was then performed.  During ablation, periodic CT imaging was performed to monitor ice ball formation and morphology. After active thaw, the cryoablation probes were removed.  Post-procedural CT was performed.  Complications: None  Findings:  By initial CT, maximal renal tumor diameter was approximately 2.8 cm.  After insertion of the probes, cryoablation resulted in a uniform ice ball that completely encompasses the tumor.  IMPRESSION:  CT guided percutaneous core biopsy and cryoablation of left renal mass.  The patient will be observed overnight.   Initial follow-up will be performed in approximately 4 weeks.  Original Report Authenticated By: Azzie Roup, M.D.    Anti-infectives: Anti-infectives    None      Assessment/Plan: s/p CT guided left renal mass core biopsy/ cryoablation 8/9; will discuss case with Dr. Pascal Lux; may need additional day of observation secondary to persistent hematuria and mild increase in creatinine. Continue hydration, supplemental oxygen    LOS: 1 day    ALLRED,D [facility] 09/20/2011

## 2011-09-20 NOTE — Progress Notes (Addendum)
Patient ID: [name], female   DOB: 1950-08-06, 61 y.o.   MRN: [id number] Pt seen and examined by Dr. Pascal Lux. Pt sitting up in bed with some mild left flank pain (3-4/10) and blood tinged urine persists in Foley. VSS/AF. Will plan to remove Foley, ambulate pt in hallway and observe additional night. Recheck labs in am.If pt experiences increased flank pain will check renal US.

## 2011-09-20 NOTE — Progress Notes (Signed)
Pt ambulated with staff to nursing station and back to room. Tolerate well. Will cont to monitor.

## 2011-09-21 LAB — BASIC METABOLIC PANEL
BUN: 23 mg/dL (ref 6–23)
CO2: 27 mEq/L (ref 19–32)
Calcium: 8.9 mg/dL (ref 8.4–10.5)
Chloride: 94 mEq/L — ABNORMAL LOW (ref 96–112)
Creatinine, Ser: 1.3 mg/dL — ABNORMAL HIGH (ref 0.50–1.10)
GFR calc Af Amer: 50 mL/min — ABNORMAL LOW (ref >90)
GFR calc non Af Amer: 43 mL/min — ABNORMAL LOW (ref >90)
Glucose, Bld: 196 mg/dL — ABNORMAL HIGH (ref 70–99)
Potassium: 3.6 mEq/L (ref 3.5–5.1)
Sodium: 132 mEq/L — ABNORMAL LOW (ref 135–145)

## 2011-09-21 LAB — GLUCOSE, CAPILLARY
Glucose-Capillary: 225 mg/dL — ABNORMAL HIGH (ref 70–99)
Glucose-Capillary: 233 mg/dL — ABNORMAL HIGH (ref 70–99)

## 2011-09-21 LAB — CBC
HCT: 31 % — ABNORMAL LOW (ref 36.0–46.0)
Hemoglobin: 10.5 g/dL — ABNORMAL LOW (ref 12.0–15.0)
MCH: 30.3 pg (ref 26.0–34.0)
MCHC: 33.9 g/dL (ref 30.0–36.0)
MCV: 89.6 fL (ref 78.0–100.0)
Platelets: 193 10*3/uL (ref 150–400)
RBC: 3.46 MIL/uL — ABNORMAL LOW (ref 3.87–5.11)
RDW: 14.2 % (ref 11.5–15.5)
WBC: 12.7 10*3/uL — ABNORMAL HIGH (ref 4.0–10.5)

## 2011-09-21 NOTE — Progress Notes (Signed)
Discharge instructions and prescriptions given.  No questions asked, verbalized understanding.  Left via wheelchair with family member.   [name]

## 2011-09-21 NOTE — Progress Notes (Signed)
Page to Dr. Vernard Gambles  In Radiology using pager number [phone], provided by radiology.

## 2011-09-21 NOTE — Progress Notes (Signed)
Rt checked on patient to see if she was ready for BIPAP.  Patient had put her own BIPAP on and the nurse tech put her O2 on 2l.

## 2011-09-21 NOTE — Discharge Instructions (Signed)
Please stay well hydrated; radiology service will call you with follow up appt time with [name] in 4 weeks; call [phone] or [phone] with any questions

## 2011-09-21 NOTE — Progress Notes (Signed)
Phone call to radiology to determine MD on call for [name]. Will call back with on call number per technician.

## 2011-09-21 NOTE — Discharge Summary (Signed)
Physician Discharge Summary  Patient ID: [name] MRN: [id number] DOB/AGE: October 08, 1950 61 y.o.  Admit date: 09/19/2011 Discharge date: 09/21/2011  Admission Diagnoses: Left lower pole renal mass, status post CT guided core biopsy and cryoablation 09/19/2011  Discharge Diagnoses: Left lower pole renal mass, status post CT guided core biopsy and cryoablation with subsequent hematuria; see below for secondary diagnoses. Past Medical History  Diagnosis Date  . Depression   . GERD (gastroesophageal reflux disease)     Followed by [name] // small bowel capsule endoscopy (11/2009) - minimal chronic inflammation, otherwise negative for  H.pylori, metaplasia, dysplasia  . Hypertension   . Hyperlipidemia   . Diabetes mellitus type II, uncontrolled     on insulin therapy  . COPD (chronic obstructive pulmonary disease) 2008    started home O2 (12/2010) // Prior significant smoker  // her CT of the chest there was no evidence of primary carcinoid tumor , scattered normal-sized mediastinal and hilar as well as axillary lymph nodes noted., no significant lymphadenopathy. COPD and emphysema with no acute cardiopulmonary disease noted , mild cardiomegaly and solitary calcified  plaque  . Moderate to severe pulmonary hypertension     severe per TEE (08/2010) - Peak RV-RA gradient 25m Hg  . Mitral regurgitation     mild to moderate per TEE (08/2010)  . Shingles   . Anemia     Anemia panel (08/2009) - iron 21, TIBC 441, UIBC 420,  ferritin 11 // Pt has been unable to tolerate iron supplementation due to chronic abdominal discomfort, source of which still is not clear. // Repeat EGD (05/2011) negative, colonoscopy (05/2011) showing only int hemorrhoids, no specific cause of anemia  . History of colonic polyps     Colonoscopy (10/2005) - colon polyp with path nonadenomatous, no malignancy - by Dr. [name] // Colonoscopy (02/2002) - 2 polyps noted, 1 - adenomatous, 2 - hyperplastic // Followed by Dr.  [name]// Repeat Colonoscopy (05/2011) - single tubular adenoma polyp - performed by Dr. [name] . Anxiety   . IBS (irritable bowel syndrome)   . Osteoporosis     DEXA (12/2004) - L-spine T score -2.6, Left hip -0.1  . Fibromyalgia   . Obstructive sleep apnea     on CPAP. // Sleep study (06/2009) - Moderate sleep apnea/ hypopnea syndrome , AHI 17.8 per hour with nonpositional hypopneas. CPAP titration to 12 CWP, AHI 2.4 per hour. Small resMed Quattro full-face mask with heated humidifier  . Restless leg syndrome   . Mitral stenosis     moderate per TEE (08/2010) // Mitral valve replacement with a 27-mm pericardial porcine valve (Medtronic Mosaic valve, serial ##[id number] on 09/20/2010 - PIvin Poot// [facility] [name] . Bilateral carotid artery stenosis     s/p right endarterectomy (06/2010). // Carotid UKorea(07/2010) -  60%-79% ICA stenosis, mid range of scale by velocity.  Left: Moderate-to-severe calcific and non-calcific plaque origin andproximal ICA and ECA.  60%-79% ICA stenosis, highest end of scale // Followed by [name]  . Peripheral vascular disease   . Coronary artery disease   . Internal hemorrhoids     small internal hemorrhoids noted on colonoscopy 05/2011  . Renal mass, left 07/21/2011    MRI Abdomen (07/2011) - 2.7 x 2.7 x 3.0 cm enhancing solid renal mass posterior aspect of the lower pole of the left kidney. // CT Abdomen in 09/2010 showed no evidence of mass // Followed by Dr. DDiona Fanti (  [facility]) --> pending evaluation by [name] for consideration of cryoablation  . Irregular heart beat   . CHF (congestive heart failure)   . Cancer   . History of blood transfusion      Discharged Condition: good  Hospital Course: [name] is a 61 year old white female who was found to have a left renal mass while undergoing evaluation for anemia by Dr. Marcy Panning. Subsequent imaging revealed an approximately 2.8 cm solid  enhancing mass emanating from the posterior lower pole cortex of the left kidney.The patient  was  seen and evaluated by Dr. Preston Fleeting  and referred to Dr. Aletta Edouard of the interventional radiology service for consideration of biopsy and cryoablation. On 09/19/2011 the patient underwent CT guided core biopsy and percutaneous cryoablation of the left renal mass  under general anesthesia by [name]. The patient tolerated the procedure well without immediate complications. She was subsequently admitted to the hospital for hemodynamic monitoring. During her hospital stay the patient had mild to moderate left flank discomfort and blood tinged urine. A Foley catheter was left in approximately 24 hours post procedure. Vicodin and dilaudid were given for flank pain and and the patient's vital signs remained stable throughout her hospital stay with the exception of a mild increase in temperature to 100.1 on 09/21/2011, down to 99.1 at time of discharge . Also noted was a mild increase in patient's creatinine to 1.3 on 09/21/2011. Following Foley removal the patient was able to void with occasional bladder spasms. Mild hematuria persisted but there was no grossly bloody urine. Hemoglobin remained fairly stable at 10.5.  The patient continued to have a mild degree of left flank pain with radiation to the left lower quadrant but not significantly increased since immediate postprocedure. She was able to ambulate without significant difficulty. The patient tolerated her diet well . She was discharged home in stable condition and will followup with [name] the interventional radiology clinic in approximately 4 weeks.She was encouraged to stay well hydrated and remain on her current home medications. We will contact the patient via phone to followup on her status next week. She was given a prescription for Cipro 543m orally twice a day for 5 days for UTI prophylaxis.  Consults: none Results for orders placed  during the hospital encounter of 09/19/11  GLUCOSE, CAPILLARY      Component Value Range   Glucose-Capillary 237 (*) 70 - 99 mg/dL   Comment 1 Notify RN    APTT      Component Value Range   aPTT 28  24 - 37 seconds  BASIC METABOLIC PANEL      Component Value Range   Sodium 136  135 - 145 mEq/L   Potassium 4.0  3.5 - 5.1 mEq/L   Chloride 98  96 - 112 mEq/L   CO2 30  19 - 32 mEq/L   Glucose, Bld 279 (*) 70 - 99 mg/dL   BUN 23  6 - 23 mg/dL   Creatinine, Ser 0.97  0.50 - 1.10 mg/dL   Calcium 9.1  8.4 - 10.5 mg/dL   GFR calc non Af Amer 61 (*) >90 mL/min   GFR calc Af Amer 71 (*) >90 mL/min  CBC      Component Value Range   WBC 16.3 (*) 4.0 - 10.5 K/uL   RBC 3.98  3.87 - 5.11 MIL/uL   Hemoglobin 12.0  12.0 - 15.0 g/dL   HCT 36.7  36.0 - 46.0 %  MCV 92.2  78.0 - 100.0 fL   MCH 30.2  26.0 - 34.0 pg   MCHC 32.7  30.0 - 36.0 g/dL   RDW 14.2  11.5 - 15.5 %   Platelets 199  150 - 400 K/uL  PROTIME-INR      Component Value Range   Prothrombin Time 13.6  11.6 - 15.2 seconds   INR 1.02  0.00 - 9.21  BASIC METABOLIC PANEL      Component Value Range   Sodium 133 (*) 135 - 145 mEq/L   Potassium 3.7  3.5 - 5.1 mEq/L   Chloride 95 (*) 96 - 112 mEq/L   CO2 28  19 - 32 mEq/L   Glucose, Bld 197 (*) 70 - 99 mg/dL   BUN 23  6 - 23 mg/dL   Creatinine, Ser 1.20 (*) 0.50 - 1.10 mg/dL   Calcium 8.8  8.4 - 10.5 mg/dL   GFR calc non Af Amer 47 (*) >90 mL/min   GFR calc Af Amer 55 (*) >90 mL/min  CBC      Component Value Range   WBC 10.5  4.0 - 10.5 K/uL   RBC 3.67 (*) 3.87 - 5.11 MIL/uL   Hemoglobin 11.1 (*) 12.0 - 15.0 g/dL   HCT 33.6 (*) 36.0 - 46.0 %   MCV 91.6  78.0 - 100.0 fL   MCH 30.2  26.0 - 34.0 pg   MCHC 33.0  30.0 - 36.0 g/dL   RDW 14.1  11.5 - 15.5 %   Platelets 186  150 - 400 K/uL  GLUCOSE, CAPILLARY      Component Value Range   Glucose-Capillary 252 (*) 70 - 99 mg/dL  GLUCOSE, CAPILLARY      Component Value Range   Glucose-Capillary 215 (*) 70 - 99 mg/dL  GLUCOSE,  CAPILLARY      Component Value Range   Glucose-Capillary 224 (*) 70 - 99 mg/dL  GLUCOSE, CAPILLARY      Component Value Range   Glucose-Capillary 183 (*) 70 - 99 mg/dL  GLUCOSE, CAPILLARY      Component Value Range   Glucose-Capillary 353 (*) 70 - 99 mg/dL  CBC      Component Value Range   WBC 12.7 (*) 4.0 - 10.5 K/uL   RBC 3.46 (*) 3.87 - 5.11 MIL/uL   Hemoglobin 10.5 (*) 12.0 - 15.0 g/dL   HCT 31.0 (*) 36.0 - 46.0 %   MCV 89.6  78.0 - 100.0 fL   MCH 30.3  26.0 - 34.0 pg   MCHC 33.9  30.0 - 36.0 g/dL   RDW 14.2  11.5 - 15.5 %   Platelets 193  150 - 400 K/uL  BASIC METABOLIC PANEL      Component Value Range   Sodium 132 (*) 135 - 145 mEq/L   Potassium 3.6  3.5 - 5.1 mEq/L   Chloride 94 (*) 96 - 112 mEq/L   CO2 27  19 - 32 mEq/L   Glucose, Bld 196 (*) 70 - 99 mg/dL   BUN 23  6 - 23 mg/dL   Creatinine, Ser 1.30 (*) 0.50 - 1.10 mg/dL   Calcium 8.9  8.4 - 10.5 mg/dL   GFR calc non Af Amer 43 (*) >90 mL/min   GFR calc Af Amer 50 (*) >90 mL/min  GLUCOSE, CAPILLARY      Component Value Range   Glucose-Capillary 225 (*) 70 - 99 mg/dL  GLUCOSE, CAPILLARY      Component Value Range  Glucose-Capillary 233 (*) 70 - 99 mg/dL   Comment 1 Notify RN     Dg Chest 2 View  09/15/2011  *RADIOLOGY REPORT*  Clinical Data: Preop for radiofrequency ablation  CHEST - 2 VIEW  Comparison: 05/23/2011  Findings: Cardiomediastinal silhouette is stable.  Status post median sternotomy.  Stable elevation of the left hemidiaphragm and left basilar atelectasis or scarring.  Metallic fixation material noted right clavicle.  No acute infiltrate or pulmonary edema.  IMPRESSION: No active disease.  Stable elevation of the left hemidiaphragm and left basilar atelectasis or scarring.  Original Report Authenticated By: Lahoma Crocker, M.D.   Ct Guide Tissue Ablation  09/19/2011  *RADIOLOGY REPORT*  Clinical Data:  2.7 cm left renal mass.  The patient presents for cryoablation and biopsy.  CT-GUIDED CORE BIOPSY OF LEFT  RENAL MASS. CT-GUIDED PERCUTANEOUS CRYOABLATION OF LEFT RENAL MASS.  Anesthesia:  General  Medications:  2 grams IV Ancef. As antibiotic prophylaxis, Ancef was ordered pre-procedure and administered intravenously within one hour of incision.  Procedure:  The procedure, risks, benefits, and alternatives were explained to the patient.  Questions regarding the procedure were encouraged and answered.  The patient understands and consents to the procedure.  The patient was placed under general anesthesia.  Initial unenhanced CT was performed in a prone position to localize the left kidney.  The left flank region was prepped with Betadine in a sterile fashion, and a sterile drape was applied covering the operative field.  A sterile gown and sterile gloves were used for the procedure.  A 17 gauge trocar needle was advanced into the left renal mass. Core biopsy was performed with an 18 gauge automated core biopsy device.  A total of 2 core samples were submitted in formalin for pathologic analysis.  Under CT guidance, a total of four Galil Ice Rod Plus percutaneous cryoablation probes were advanced into the left renal mass.  Probe positioning was confirmed by CT prior to cryoablation.  Cryoablation was performed through the four probes simultaneously. Initial 10 minute cycle of cryoablation was performed.  This was followed by a 8 minute thaw cycle.  A second 10 minute cycle of cryoablation was then performed.  During ablation, periodic CT imaging was performed to monitor ice ball formation and morphology. After active thaw, the cryoablation probes were removed.  Post-procedural CT was performed.  Complications: None  Findings:  By initial CT, maximal renal tumor diameter was approximately 2.8 cm.  After insertion of the probes, cryoablation resulted in a uniform ice ball that completely encompasses the tumor.  IMPRESSION:  CT guided percutaneous core biopsy and cryoablation of left renal mass.  The patient will be observed  overnight.  Initial follow-up will be performed in approximately 4 weeks.  Original Report Authenticated By: Azzie Roup, M.D.   Ct Biopsy  09/19/2011  *RADIOLOGY REPORT*  Clinical Data:  2.7 cm left renal mass.  The patient presents for cryoablation and biopsy.  CT-GUIDED CORE BIOPSY OF LEFT RENAL MASS. CT-GUIDED PERCUTANEOUS CRYOABLATION OF LEFT RENAL MASS.  Anesthesia:  General  Medications:  2 grams IV Ancef. As antibiotic prophylaxis, Ancef was ordered pre-procedure and administered intravenously within one hour of incision.  Procedure:  The procedure, risks, benefits, and alternatives were explained to the patient.  Questions regarding the procedure were encouraged and answered.  The patient understands and consents to the procedure.  The patient was placed under general anesthesia.  Initial unenhanced CT was performed in a prone position to localize the left kidney.  The left flank region was prepped with Betadine in a sterile fashion, and a sterile drape was applied covering the operative field.  A sterile gown and sterile gloves were used for the procedure.  A 17 gauge trocar needle was advanced into the left renal mass. Core biopsy was performed with an 18 gauge automated core biopsy device.  A total of 2 core samples were submitted in formalin for pathologic analysis.  Under CT guidance, a total of four Galil Ice Rod Plus percutaneous cryoablation probes were advanced into the left renal mass.  Probe positioning was confirmed by CT prior to cryoablation.  Cryoablation was performed through the four probes simultaneously. Initial 10 minute cycle of cryoablation was performed.  This was followed by a 8 minute thaw cycle.  A second 10 minute cycle of cryoablation was then performed.  During ablation, periodic CT imaging was performed to monitor ice ball formation and morphology. After active thaw, the cryoablation probes were removed.  Post-procedural CT was performed.  Complications: None  Findings:   By initial CT, maximal renal tumor diameter was approximately 2.8 cm.  After insertion of the probes, cryoablation resulted in a uniform ice ball that completely encompasses the tumor.  IMPRESSION:  CT guided percutaneous core biopsy and cryoablation of left renal mass.  The patient will be observed overnight.  Initial follow-up will be performed in approximately 4 weeks.  Original Report Authenticated By: Azzie Roup, M.D.   Significant Diagnostic Studies: CT abdomen at the time of left renal mass biopsy and cryoablation revealing an approximately 2.8 cm solid mass emanating from the posterior lower pole cortex of the left kidney.  Treatments: CT guided core biopsy and percutaneous cryoablation of left lower pole renal mass on 09/19/2011  Discharge Exam: Blood pressure 122/62, pulse 99, temperature 99.1 F (37.3 C), temperature source Oral, resp. rate 20, height _0  (1.575 m), weight 190 lb 9.6 oz (86.456 kg), SpO2 97.00%. Patient is awake, alert and oriented. Chest with distant breath sounds bilaterally. Heart regular rate and rhythm without murmur. Abdomen soft, obese, positive bowel sounds, mildly tender left lower quadrant and mid pelvic region. Puncture site left flank region clean and dry, mildly tender to palpation. Extremities with full range of motion and no significant edema.  Disposition: Home  Discharge Orders    Future Appointments: Provider: Department: Dept Phone: Center:   09/30/2011 11:30 AM Lelon Perla, MD [place] [phone] [facility]   10/23/2011 1:15 PM Annamarie Dawley, DO [facility] [phone] [facility]   10/27/2011 11:00 AM Deatra Robinson, MD [facility] [phone] None   08/16/2012 1:00 PM [facility] [phone] [facility]   08/16/2012 2:00 PM Princess Perna, NP [facility] [phone] [facility]     Future Orders Please Complete By Expires   Diet - low sodium heart healthy      Increase activity slowly      May  shower / Bathe      May walk up steps      Driving Restrictions      Comments:   No driving for next 48 hours   Lifting restrictions      Comments:   No heavy lifting for next 48 hours   Call MD for:  temperature >100.4      Call MD for:  persistant nausea and vomiting      Call MD for:  severe uncontrolled pain      Call MD for:  redness, tenderness, or signs  of infection (pain, swelling, redness, odor or green/yellow discharge around incision site)      Call MD for:  persistant dizziness or light-headedness      (HEART FAILURE PATIENTS) Call MD:  Anytime you have any of the following symptoms: 1) 3 pound weight gain in 24 hours or 5 pounds in 1 week 2) shortness of breath, with or without a dry hacking cough 3) swelling in the hands, feet or stomach 4) if you have to sleep on extra pillows at night in order to breathe.      Call MD for:  difficulty breathing, headache or visual disturbances      Discharge patient        Medication List  As of 09/21/2011  8:43 AM   TAKE these medications         ACCU-CHEK FASTCLIX LANCET Kit   1 each by Does not apply route 3 (three) times daily.      ACCU-CHEK FASTCLIX LANCETS Misc   1 each by Does not apply route 3 (three) times daily before meals. Dx code- 250.00      acetaminophen 500 MG tablet   Commonly known as: TYLENOL   Take 1,000 mg by mouth every 6 (six) hours as needed. For pain      albuterol 108 (90 BASE) MCG/ACT inhaler   Commonly known as: PROVENTIL HFA;VENTOLIN HFA   Inhale 2 puffs into the lungs every 6 (six) hours as needed. For breathing      ALPRAZolam 1 MG tablet   Commonly known as: XANAX   Take 1 tablet (1 mg total) by mouth 3 (three) times daily as needed for sleep or anxiety.      aspirin 81 MG tablet   Take 81 mg by mouth daily with supper.      B-D UF III MINI PEN NEEDLES 31G X 5 MM Misc   Generic drug: Insulin Pen Needle   by Does not apply route 3 (three) times daily.      buPROPion 150 MG 24 hr tablet    Commonly known as: WELLBUTRIN XL   Take 150 mg by mouth daily with supper.      docusate sodium 50 MG capsule   Commonly known as: COLACE   Take by mouth every other day. Alternate with  senokot      DULoxetine 30 MG capsule   Commonly known as: CYMBALTA   daily with breakfast. Take 3 tablets by mouth once daily      ferrous gluconate 324 MG tablet   Commonly known as: FERGON   Take 1 tablet (324 mg total) by mouth 2 (two) times daily with a meal.      Fluticasone-Salmeterol 250-50 MCG/DOSE Aepb   Commonly known as: ADVAIR   Inhale 1 puff into the lungs every 12 (twelve) hours.      furosemide 80 MG tablet   Commonly known as: LASIX   Take 1 tablet (80 mg total) by mouth 2 (two) times daily.      gabapentin 300 MG capsule   Commonly known as: NEURONTIN   Take 1 capsule (300 mg total) by mouth 2 (two) times daily.      glucose blood test strip   Use to test blood sugar 3 times daily.Dx code 250.00      HYDROcodone-acetaminophen 5-325 MG per tablet   Commonly known as: NORCO/VICODIN   1-2 tablets every 6 hours as needed for pain. Do not take with alcohol, do no drive or operate  heavy machinery if drowsiness occurs. Do not take more than instructed.      insulin aspart 100 UNIT/ML injection   Commonly known as: novoLOG   Inject 9-19 Units into the skin 3 (three) times daily before meals. 7 unit base and sliding scale      insulin detemir 100 UNIT/ML injection   Commonly known as: LEVEMIR   Inject 50 Units into the skin at bedtime. DISPENSE 2 VIALS/ MONTH      INSULIN SYRINGE .3CC/31GX5/16" 31G X 5/16" 0.3 ML Misc   1 Syringe by Does not apply route 2 (two) times daily at 10 AM and 5 PM.      BD INSULIN SYRINGE ULTRAFINE 31G X 5/16" 1 ML Misc   Generic drug: Insulin Syringe-Needle U-100   by Does not apply route every morning.      loratadine 10 MG tablet   Commonly known as: CLARITIN   Take 10 mg by mouth daily with supper.      omeprazole 40 MG capsule   Commonly  known as: PRILOSEC   Take 40 mg by mouth 2 (two) times daily.      potassium chloride SA 20 MEQ tablet   Commonly known as: K-DUR,KLOR-CON   Take 1 tablet (20 mEq total) by mouth 3 (three) times daily.      rosuvastatin 20 MG tablet   Commonly known as: CRESTOR   Take 20 mg by mouth daily with supper.      sennosides-docusate sodium 8.6-50 MG tablet   Commonly known as: SENOKOT-S   Take 2 tablets by mouth every other day. Hold for loose stools      tiotropium 18 MCG inhalation capsule   Commonly known as: SPIRIVA   Place 18 mcg into inhaler and inhale daily with breakfast.      TruForm Stockings 20-53mHg Misc   Wear compression stockings throughout the day, and take off at nighttime.           Follow-up Information    [facility] (xray will call you with follow up appt time with Dr. YKathlene Cotein 4 weeks; call [phone] [phone] any questions; continue follow up with Dr. DDiona Fantiand other physicians)    Contact information:   [address]         Signed: AAutumn Messing8/12/2011, 8:43 AM

## 2011-09-21 NOTE — Progress Notes (Signed)
Pt up frequently during the night to urinate, with amounts of 200-400cc's of brownish red urine with thin clots per each time. Denies bladder discomfort or difficulty urinating. Bladder scan completed and 937m of urine noted to be in bladder during scan. Will follow up.

## 2011-09-21 NOTE — Progress Notes (Addendum)
Phone call from Dr. Vernard Gambles. Notified of bladder scan results of 913m, pts denial of discomfort or inability to pass urine, brownish red urine with thin clots,  and notified of elevated temperature. Dr. HVernard Gamblesreported he would pass information on to PA that would be in at 7am, for evaluation of pt.

## 2011-09-22 ENCOUNTER — Other Ambulatory Visit: Payer: Self-pay | Admitting: Interventional Radiology

## 2011-09-22 DIAGNOSIS — N2889 Other specified disorders of kidney and ureter: Secondary | ICD-10-CM

## 2011-09-22 NOTE — Discharge Summary (Signed)
Agree.  Will follow renal function, temp and pain as outpatient.

## 2011-09-23 ENCOUNTER — Other Ambulatory Visit: Payer: Self-pay | Admitting: *Deleted

## 2011-09-23 DIAGNOSIS — J449 Chronic obstructive pulmonary disease, unspecified: Secondary | ICD-10-CM

## 2011-09-23 MED ORDER — TIOTROPIUM BROMIDE MONOHYDRATE 18 MCG IN CAPS: 18.0000 ug | ORAL_CAPSULE | Freq: Every day | RESPIRATORY_TRACT | Status: DC

## 2011-09-24 ENCOUNTER — Telehealth: Payer: Self-pay | Admitting: Emergency Medicine

## 2011-09-24 ENCOUNTER — Other Ambulatory Visit: Payer: Self-pay | Admitting: Emergency Medicine

## 2011-09-24 ENCOUNTER — Other Ambulatory Visit (HOSPITAL_COMMUNITY): Payer: Self-pay | Admitting: Radiology

## 2011-09-24 DIAGNOSIS — N2889 Other specified disorders of kidney and ureter: Secondary | ICD-10-CM

## 2011-09-24 NOTE — Telephone Encounter (Signed)
Called pt. With f/u appt. For 10-23-11 CT and visit w/ Dr Kathlene Cote and to have labs drawn wk prior at [facility].  Pt also mentioned that she is still taking Senakot & Colace and has not had a BM since procedure, hematuria and stopped , has had a decrease in energy level and some pain since lying around.  She denies any N &V, fever, chills.   Told pt. To call if symptoms become worse or she runs a fever.  Will make Dr Kathlene Cote aware.

## 2011-09-30 ENCOUNTER — Encounter: Payer: Self-pay | Admitting: Cardiology

## 2011-09-30 ENCOUNTER — Ambulatory Visit (INDEPENDENT_AMBULATORY_CARE_PROVIDER_SITE_OTHER): Payer: Medicare Other | Admitting: Cardiology

## 2011-09-30 VITALS — BP 100/68 | HR 81 | Ht 62.0 in | Wt 184.0 lb

## 2011-09-30 DIAGNOSIS — Z952 Presence of prosthetic heart valve: Secondary | ICD-10-CM

## 2011-09-30 DIAGNOSIS — I2789 Other specified pulmonary heart diseases: Secondary | ICD-10-CM

## 2011-09-30 DIAGNOSIS — I679 Cerebrovascular disease, unspecified: Secondary | ICD-10-CM

## 2011-09-30 DIAGNOSIS — I272 Pulmonary hypertension, unspecified: Secondary | ICD-10-CM

## 2011-09-30 DIAGNOSIS — E785 Hyperlipidemia, unspecified: Secondary | ICD-10-CM

## 2011-09-30 DIAGNOSIS — I4891 Unspecified atrial fibrillation: Secondary | ICD-10-CM

## 2011-09-30 DIAGNOSIS — Z954 Presence of other heart-valve replacement: Secondary | ICD-10-CM

## 2011-09-30 DIAGNOSIS — I10 Essential (primary) hypertension: Secondary | ICD-10-CM

## 2011-09-30 LAB — BASIC METABOLIC PANEL
BUN: 20 mg/dL (ref 6–23)
CO2: 31 mEq/L (ref 19–32)
Calcium: 9.5 mg/dL (ref 8.4–10.5)
Chloride: 98 mEq/L (ref 96–112)
Creatinine, Ser: 1.2 mg/dL (ref 0.4–1.2)
GFR: 47.85 mL/min — ABNORMAL LOW (ref >60.00)
Glucose, Bld: 173 mg/dL — ABNORMAL HIGH (ref 70–99)
Potassium: 3.7 mEq/L (ref 3.5–5.1)
Sodium: 139 mEq/L (ref 135–145)

## 2011-09-30 NOTE — Assessment & Plan Note (Signed)
Patient remains in sinus rhythm. She has had previous Maze procedure. Coumadin discontinued previously because of falls. Continue aspirin.

## 2011-09-30 NOTE — Assessment & Plan Note (Signed)
Blood pressure controlled. Continue present medications.

## 2011-09-30 NOTE — Progress Notes (Signed)
HPI: Pleasant female I saw in April of 2012 for evaluation of chest pain. Carotid Dopplers in May 2012 showed an 80-99% right and a 60-79% left stenosis. Patient had right carotid endarterectomy and is now followed by vascular surgery. Patient had previous workup for mitral stenosis. Cardiac catheterization in August of 2012 showed normal LV function, normal coronary arteries, severe mitral stenosis, severely elevated pulmonary capillary wedge pressure and severe pulmonary hypertension. The patient had mitral valve replacement with a 27-mm pericardial porcine valve and left atrial maze procedure for paroxysmal atrial fibrillation on September 21, 2010. She developed atrial fibrillation postoperatively and had been treated with amiodarone. Patient had elective DCCV in Oct 2012. Echocardiogram in November of 2012 showed an ejection fraction of 55-65%. There was grade 2 diastolic dysfunction. There was a bioprosthetic mitral valve. The mean gradient was 5 mm of mercury. The left atrium was mildly dilated. There was severely elevated pulmonary pressures. I last saw her in May of 2013 and we dced coumadin because of falls and amiodarone. Since then, she has been treated for renal cell ca. She does have dyspnea on exertion but no orthopnea, PND, pedal edema, exertional chest pain or syncope.   Current Outpatient Prescriptions  Medication Sig Dispense Refill  . ACCU-CHEK FASTCLIX LANCETS MISC 1 each by Does not apply route 3 (three) times daily before meals. Dx code- 250.00  102 each  12  . acetaminophen (TYLENOL) 500 MG tablet Take 1,000 mg by mouth every 6 (six) hours as needed. For pain      . albuterol (PROVENTIL HFA;VENTOLIN HFA) 108 (90 BASE) MCG/ACT inhaler Inhale 2 puffs into the lungs every 6 (six) hours as needed. For breathing      . ALPRAZolam (XANAX) 1 MG tablet Take 1 tablet (1 mg total) by mouth 3 (three) times daily as needed for sleep or anxiety.  90 tablet  5  . aspirin 81 MG tablet Take 81 mg  by mouth daily with supper.       Marland Kitchen buPROPion (WELLBUTRIN XL) 150 MG 24 hr tablet Take 150 mg by mouth daily with supper.      . docusate sodium (COLACE) 50 MG capsule Take by mouth every other day. Alternate with  senokot      . DULoxetine (CYMBALTA) 30 MG capsule daily with breakfast. Take 3 tablets by mouth once daily      . Elastic Bandages & Supports (TRUFORM STOCKINGS 20-30MMHG) MISC Wear compression stockings throughout the day, and take off at nighttime.  2 each  1  . ferrous gluconate (FERGON) 324 MG tablet Take 1 tablet (324 mg total) by mouth 2 (two) times daily with a meal.  120 tablet  3  . Fluticasone-Salmeterol (ADVAIR DISKUS) 250-50 MCG/DOSE AEPB Inhale 1 puff into the lungs every 12 (twelve) hours.  1 each  11  . furosemide (LASIX) 80 MG tablet Take 1 tablet (80 mg total) by mouth 2 (two) times daily.  60 tablet  12  . gabapentin (NEURONTIN) 300 MG capsule Take 1 capsule (300 mg total) by mouth 2 (two) times daily.  60 capsule  5  . glucose blood (BAYER CONTOUR TEST) test strip Use to test blood sugar 3 times daily.Dx code 250.00  100 each  12  . HYDROcodone-acetaminophen (NORCO) 5-325 MG per tablet 1-2 tablets every 6 hours as needed for pain. Do not take with alcohol, do no drive or operate heavy machinery if drowsiness occurs. Do not take more than instructed.  240 tablet  1  . insulin aspart (NOVOLOG) 100 UNIT/ML injection Inject 9-19 Units into the skin 3 (three) times daily before meals. 7 unit base and sliding scale      . insulin detemir (LEVEMIR) 100 UNIT/ML injection Inject 50 Units into the skin at bedtime. DISPENSE 2 VIALS/ MONTH  20 mL  5  . Insulin Pen Needle (B-D UF III MINI PEN NEEDLES) 31G X 5 MM MISC by Does not apply route 3 (three) times daily.      . Insulin Syringe-Needle U-100 (BD INSULIN SYRINGE ULTRAFINE) 31G X 5/16" 1 ML MISC by Does not apply route every morning.      . Insulin Syringe-Needle U-100 (INSULIN SYRINGE .3CC/31GX5/16") 31G X 5/16" 0.3 ML MISC 1  Syringe by Does not apply route 2 (two) times daily at 10 AM and 5 PM.  100 each  11  . Lancets Misc. (ACCU-CHEK FASTCLIX LANCET) KIT 1 each by Does not apply route 3 (three) times daily.  1 kit  0  . loratadine (CLARITIN) 10 MG tablet Take 10 mg by mouth daily with supper.      Marland Kitchen omeprazole (PRILOSEC) 40 MG capsule Take 40 mg by mouth 2 (two) times daily.       . potassium chloride SA (K-DUR,KLOR-CON) 20 MEQ tablet Take 1 tablet (20 mEq total) by mouth 3 (three) times daily.  90 tablet  12  . rosuvastatin (CRESTOR) 20 MG tablet Take 20 mg by mouth daily with supper.      . sennosides-docusate sodium (SENOKOT-S) 8.6-50 MG tablet Take 2 tablets by mouth every other day. Hold for loose stools      . tiotropium (SPIRIVA) 18 MCG inhalation capsule Place 1 capsule (18 mcg total) into inhaler and inhale daily with breakfast.  30 capsule  11     Past Medical History  Diagnosis Date  . Depression   . GERD (gastroesophageal reflux disease)     Followed by [name] // small bowel capsule endoscopy (11/2009) - minimal chronic inflammation, otherwise negative for  H.pylori, metaplasia, dysplasia  . Hypertension   . Hyperlipidemia   . Diabetes mellitus type II, uncontrolled     on insulin therapy  . COPD (chronic obstructive pulmonary disease) 2008    started home O2 (12/2010) // Prior significant smoker  // her CT of the chest there was no evidence of primary carcinoid tumor , scattered normal-sized mediastinal and hilar as well as axillary lymph nodes noted., no significant lymphadenopathy. COPD and emphysema with no acute cardiopulmonary disease noted , mild cardiomegaly and solitary calcified  plaque  . Moderate to severe pulmonary hypertension     severe per [facility] (08/2010) - Peak RV-RA gradient 79m Hg  . Mitral regurgitation     mild to moderate per [facility] (08/2010)  . Shingles   . Anemia     Anemia panel (08/2009) - iron 21, TIBC 441, UIBC 420,  ferritin 11 // Pt has been unable to tolerate iron  supplementation due to chronic abdominal discomfort, source of which still is not clear. // Repeat EGD (05/2011) negative, colonoscopy (05/2011) showing only int hemorrhoids, no specific cause of anemia  . History of colonic polyps     Colonoscopy (10/2005) - colon polyp with path nonadenomatous, no malignancy - by Dr. [name] // Colonoscopy (02/2002) - 2 polyps noted, 1 - adenomatous, 2 - hyperplastic // Followed by Dr. [name]// Repeat Colonoscopy (05/2011) - single tubular adenoma polyp - performed by Dr. [name] . Anxiety   .  IBS (irritable bowel syndrome)   . Osteoporosis     DEXA (12/2004) - L-spine T score -2.6, Left hip -0.1  . Fibromyalgia   . Obstructive sleep apnea     on CPAP. // Sleep study (06/2009) - Moderate sleep apnea/ hypopnea syndrome , AHI 17.8 per hour with nonpositional hypopneas. CPAP titration to 12 CWP, AHI 2.4 per hour. Small resMed Quattro full-face mask with heated humidifier  . Restless leg syndrome   . Mitral stenosis     moderate per [facility] (08/2010) // Mitral valve replacement with a 27-mm pericardial porcine valve (Medtronic Mosaic valve, serial #[id number]) on 09/20/2010 - [name] // Followed at [facility], Dr. Stanford Breed  . Bilateral carotid artery stenosis     s/p right endarterectomy (06/2010). // Carotid US (07/2010) -  60%-79% ICA stenosis, mid range of scale by velocity.  Left: Moderate-to-severe calcific and non-calcific plaque origin andproximal ICA and ECA.  60%-79% ICA stenosis, highest end of scale // Followed by [name].  . Peripheral vascular disease   . Coronary artery disease   . Internal hemorrhoids     small internal hemorrhoids noted on colonoscopy 05/2011  . Renal mass, left 07/21/2011    MRI Abdomen (07/2011) - 2.7 x 2.7 x 3.0 cm enhancing solid renal mass posterior aspect of the lower pole of the left kidney. // CT Abdomen in 09/2010 showed no evidence of mass // Followed by [name]  [facility]) --> pending  evaluation by [name] for consideration of cryoablation  . Irregular heart beat   . CHF (congestive heart failure)   . Cancer   . History of blood transfusion     Past Surgical History  Procedure Date  . Orif clavicle fracture 01/2004    by Thana Farr. [name], M.D for Right clavicle nonunion.  . Tonsillectomy   . Carotid endarterectomy 07/04/2010    right, by [name] for asymptomatic right carotid artery stenosis  . Lipoma exision 08/2005    occipital lipoma 1.5cm - by Dr. [name]  . Hysteroscopy with endometrial ablation 06/2001    for persistent post-menopausal bleeding // by S. [name], M.D.  . Mitral valve replacement 09/20/10     with a 27-mm pericardial porcine valve (Medtronic Mosaic valve, serial #[id number]). 09/20/10, Dr Prescott Gum  . Left atrial maze procedure 09/20/10    for paroxysmal atrial fibrillation (Dr. Prescott Gum)  . Placement of left chest tube. 09/24/2010    Dr Prescott Gum  . Pr vein bypass graft,aorto-fem-pop May 2012  . Cardiac valve replacement Aug. 2012  . Colonoscopy 05/12/2011    performed by Dr. Michail Sermon. Showing small internal hemorrhoids, single tubular adenoma polyp  . Esophagogastroduodenoscopy 05/12/2011    performed by Dr. Michail Sermon. Negative for ulcerations, biopsy negative for evidence of celiac sprue  . Tubal ligation     History   Social History  . Marital Status: Divorced    Spouse Name: N/A    Number of Children: N/A  . Years of Education: N/A   Occupational History  . Not on file.   Social History Main Topics  . Smoking status: Former Smoker -- 1.0 packs/day for 50 years    Types: Cigarettes    Quit date: 07/12/2010  . Smokeless tobacco: Never Used  . Alcohol Use: No  . Drug Use: No  . Sexually Active: No   Other Topics Concern  . Not on file   Social History Narrative  . No narrative on file  ROS: no fevers or chills, productive cough, hemoptysis, dysphasia, odynophagia, melena, hematochezia, dysuria, hematuria, rash,  seizure activity, orthopnea, PND, pedal edema, claudication. Remaining systems are negative.  Physical Exam: Well-developed chronically ill appearing in no acute distress.  Skin is warm and dry.  HEENT is normal.  Neck is supple.  Chest is diminished breath sounds throughout Cardiovascular exam is regular rate and rhythm.  Abdominal exam nontender or distended. No masses palpated. Extremities show no edema. neuro grossly intact  ECG sinus rhythm with occasional PVCs or aberrantly conducted beat. Normal axis. RV conduction delay. Anterior T wave changes. First degree AV block.

## 2011-09-30 NOTE — Assessment & Plan Note (Signed)
Continue aspirin and statin. Followed by vascular surgery.

## 2011-09-30 NOTE — Assessment & Plan Note (Signed)
Continue SBE prophylaxis. SBE card provided today. Repeat echocardiogram when she returns in 6 months.

## 2011-09-30 NOTE — Patient Instructions (Addendum)
Your physician wants you to follow-up in: [place] will receive a reminder letter in the mail two months in advance. If you don't receive a letter, please call our office to schedule the follow-up appointment.   Your physician recommends that you HAVE LAB WORK TODAY

## 2011-09-30 NOTE — Assessment & Plan Note (Signed)
Continue oxygen. Euvolemic on examination. Continue present dose of diuretic. Check potassium and renal function.

## 2011-09-30 NOTE — Assessment & Plan Note (Signed)
Management per primary care. 

## 2011-10-02 ENCOUNTER — Telehealth: Payer: Self-pay | Admitting: Dietician

## 2011-10-02 NOTE — Telephone Encounter (Signed)
I would think it would not. Thanks

## 2011-10-02 NOTE — Telephone Encounter (Signed)
Had to get strips from new vender and the ones she got are for the new contour next meter rather than the contour meter.  CDE called [employer] and they are sending her a new meter to her house in the next 2-3 business days.   Communicated this to patient and also advised she temporarily decrease her Novolog by 2-4 units until she gets her meter and can check blood sugar.

## 2011-10-07 LAB — BUN: BUN: 21 mg/dL (ref 6–23)

## 2011-10-08 ENCOUNTER — Telehealth: Payer: Self-pay | Admitting: *Deleted

## 2011-10-08 LAB — CREATININE WITH EST GFR
Creat: 1.03 mg/dL (ref 0.50–1.10)
GFR, Est African American: 67 mL/min
GFR, Est Non African American: 58 mL/min — ABNORMAL LOW

## 2011-10-08 NOTE — Telephone Encounter (Addendum)
[facility], [name], calls to say pt's blood sugars are elevated recently, in last 24hrs- this am 347 and she took 17units, noon 372 and she took 19 units, she also has been smoking again due to "stress" with health and family. She is given appt for tomorrow dr Burnard Bunting 1315 per chilonb. She is advised if she has any problems between now and appt to please call 911 or go to ED or urg care, she is agreeable

## 2011-10-08 NOTE — Telephone Encounter (Signed)
Agree with decision to schedule follow-up appointment to assess for possible reasons that blood sugars have been elevated recently.

## 2011-10-09 ENCOUNTER — Encounter: Payer: Medicare Other | Admitting: Internal Medicine

## 2011-10-20 ENCOUNTER — Other Ambulatory Visit: Payer: Self-pay | Admitting: Cardiology

## 2011-10-21 ENCOUNTER — Other Ambulatory Visit: Payer: Self-pay | Admitting: *Deleted

## 2011-10-21 DIAGNOSIS — M797 Fibromyalgia: Secondary | ICD-10-CM

## 2011-10-23 ENCOUNTER — Encounter: Payer: Self-pay | Admitting: Internal Medicine

## 2011-10-23 ENCOUNTER — Ambulatory Visit (INDEPENDENT_AMBULATORY_CARE_PROVIDER_SITE_OTHER): Payer: Medicare Other | Admitting: Internal Medicine

## 2011-10-23 ENCOUNTER — Ambulatory Visit
Admission: RE | Admit: 2011-10-23 | Discharge: 2011-10-23 | Disposition: A | Discharge status: Home / Self Care | Payer: Medicare Other | Source: Ambulatory Visit | Attending: Interventional Radiology | Admitting: Interventional Radiology

## 2011-10-23 ENCOUNTER — Ambulatory Visit (HOSPITAL_COMMUNITY)
Admission: RE | Admit: 2011-10-23 | Discharge: 2011-10-23 | Disposition: A | Discharge status: Home / Self Care | Payer: Medicare Other | Source: Ambulatory Visit | Attending: Interventional Radiology | Admitting: Interventional Radiology

## 2011-10-23 ENCOUNTER — Telehealth: Payer: Self-pay | Admitting: *Deleted

## 2011-10-23 VITALS — BP 137/67 | HR 94 | Temp 96.8°F | Ht 62.0 in | Wt 184.9 lb

## 2011-10-23 DIAGNOSIS — N2889 Other specified disorders of kidney and ureter: Secondary | ICD-10-CM

## 2011-10-23 DIAGNOSIS — R296 Repeated falls: Secondary | ICD-10-CM

## 2011-10-23 DIAGNOSIS — N200 Calculus of kidney: Secondary | ICD-10-CM | POA: Insufficient documentation

## 2011-10-23 DIAGNOSIS — K7689 Other specified diseases of liver: Secondary | ICD-10-CM | POA: Insufficient documentation

## 2011-10-23 DIAGNOSIS — C649 Malignant neoplasm of unspecified kidney, except renal pelvis: Secondary | ICD-10-CM

## 2011-10-23 DIAGNOSIS — I7090 Unspecified atherosclerosis: Secondary | ICD-10-CM | POA: Insufficient documentation

## 2011-10-23 DIAGNOSIS — Z9181 History of falling: Secondary | ICD-10-CM

## 2011-10-23 DIAGNOSIS — Z23 Encounter for immunization: Secondary | ICD-10-CM

## 2011-10-23 DIAGNOSIS — Z954 Presence of other heart-valve replacement: Secondary | ICD-10-CM | POA: Insufficient documentation

## 2011-10-23 DIAGNOSIS — E785 Hyperlipidemia, unspecified: Secondary | ICD-10-CM

## 2011-10-23 DIAGNOSIS — E1165 Type 2 diabetes mellitus with hyperglycemia: Secondary | ICD-10-CM

## 2011-10-23 DIAGNOSIS — E669 Obesity, unspecified: Secondary | ICD-10-CM | POA: Insufficient documentation

## 2011-10-23 DIAGNOSIS — E119 Type 2 diabetes mellitus without complications: Secondary | ICD-10-CM

## 2011-10-23 DIAGNOSIS — E278 Other specified disorders of adrenal gland: Secondary | ICD-10-CM | POA: Insufficient documentation

## 2011-10-23 DIAGNOSIS — I10 Essential (primary) hypertension: Secondary | ICD-10-CM

## 2011-10-23 DIAGNOSIS — N289 Disorder of kidney and ureter, unspecified: Secondary | ICD-10-CM | POA: Insufficient documentation

## 2011-10-23 DIAGNOSIS — J984 Other disorders of lung: Secondary | ICD-10-CM | POA: Insufficient documentation

## 2011-10-23 DIAGNOSIS — IMO0001 Reserved for inherently not codable concepts without codable children: Secondary | ICD-10-CM

## 2011-10-23 DIAGNOSIS — Z0000 Encounter for general adult medical examination without abnormal findings: Secondary | ICD-10-CM

## 2011-10-23 DIAGNOSIS — IMO0002 Reserved for concepts with insufficient information to code with codable children: Secondary | ICD-10-CM

## 2011-10-23 DIAGNOSIS — R1012 Left upper quadrant pain: Secondary | ICD-10-CM | POA: Insufficient documentation

## 2011-10-23 HISTORY — DX: Morbid (severe) obesity due to excess calories: E66.01

## 2011-10-23 LAB — GLUCOSE, CAPILLARY: Glucose-Capillary: 275 mg/dL — ABNORMAL HIGH (ref 70–99)

## 2011-10-23 LAB — POCT GLYCOSYLATED HEMOGLOBIN (HGB A1C): Hemoglobin A1C: 9.2

## 2011-10-23 MED ORDER — ZOSTER VACCINE LIVE 19400 UNT/0.65ML ~~LOC~~ SOLR: 0.6500 mL | Freq: Once | SUBCUTANEOUS | Status: AC

## 2011-10-23 MED ORDER — HYDROCODONE-ACETAMINOPHEN 5-325 MG PO TABS: ORAL_TABLET | ORAL | Status: DC

## 2011-10-23 MED ORDER — LISINOPRIL 10 MG PO TABS: 10.0000 mg | ORAL_TABLET | Freq: Every day | ORAL | Status: DC

## 2011-10-23 MED ORDER — INSULIN DETEMIR 100 UNIT/ML ~~LOC~~ SOLN: 56.0000 [IU] | Freq: Every day | SUBCUTANEOUS | Status: DC

## 2011-10-23 MED ORDER — IOHEXOL 300 MG/ML  SOLN
100.0000 mL | Freq: Once | INTRAMUSCULAR | Status: AC | PRN
  Administered 2011-10-23: 100 mL via INTRAVENOUS

## 2011-10-23 MED ORDER — ACCU-CHEK FASTCLIX LANCETS MISC: 1.0000 | Freq: Three times a day (TID) | Status: DC

## 2011-10-23 NOTE — Patient Instructions (Addendum)
Please follow-up at the clinic in 1 month, at which time we will reevaluate diabetes, blood pressure, labs - OR, please follow-up in the clinic sooner if needed.  There have been changes in your medications:  Increase Levemir to 56 units at bedtime  START Lisinopril 10 mg daily for blood pressure, diabetes, heart failure.   If you start having blood sugars < 39m/dL, call the clinic and correct them (look at the information below)  You are getting labs today, if they are abnormal I will give you a call.  If you have been started on new medication(s), and you develop symptoms concerning for allergic reaction, including, but not limited to, throat closing, tongue swelling, rash, please stop the medication immediately and call the clinic at [phone] and go to the ER.  If you are diabetic, please bring your meter to your next visit.  If symptoms worsen, or new symptoms arise, please call the clinic or go to the ER.  Please bring all of your medications in a bag to your next visit.   Hypoglycemia (Low Blood Sugar) Hypoglycemia is when the glucose (sugar) in your blood is too low. Hypoglycemia can happen for many reasons. It can happen to people with or without diabetes. Hypoglycemia can develop quickly and can be a medical emergency.    CAUSES   Having hypoglycemia does not mean that you will develop diabetes. Different causes include: Missed or delayed meals or not enough carbohydrates eaten.  Medication overdose. This could be by accident or deliberate. If by accident, your medication may need to be adjusted or changed.  Exercise or increased activity without adjustments in carbohydrates or medications.  A nerve disorder that affects body functions like your heart rate, blood pressure and digestion (autonomic neuropathy).  A condition where the stomach muscles do not function properly (gastroparesis). Therefore, medications may not absorb properly.  The inability to recognize the  signs of hypoglycemia (hypoglycemic unawareness).  Absorption of insulin - may be altered.  Alcohol consumption.  Pregnancy/menstrual cycles/postpartum. This may be due to hormones.  Certain kinds of tumors. This is very rare.   SYMPTOMS   Sweating.  Hunger.  Dizziness.  Blurred vision.  Drowsiness.  Weakness.  Headache.  Rapid heart beat.  Shakiness.  Nervousness.   DIAGNOSIS   Diagnosis is made by monitoring blood glucose in one or all of the following ways: Fingerstick blood glucose monitoring.  Laboratory results.   TREATMENT   If you think your blood glucose is low: Check your blood glucose, if possible. If it is less than 70 mg/dl, take one of the following:  3-4 glucose tablets.   cup juice (prefer clear like apple).   cup "regular" soda pop.  1 cup milk.  -1 tube of glucose gel.  5-6 hard candies.  Do not over treat because your blood glucose (sugar) will only go too high.  Wait 15 minutes and recheck your blood glucose. If it is still less than 70 mg/dl (or below your target range), repeat treatment.  Eat a snack if it is more than one hour until your next meal.  Sometimes, your blood glucose may go so low that you are unable to treat yourself. You may need someone to help you. You may even pass out or be unable to swallow. This may require you to get an injection of glucagon, which raises the blood glucose.  HOME CARE INSTRUCTIONS Check blood glucose as recommended by your caregiver.  Take medication as prescribed by your  caregiver.  Follow your meal plan. Do not skip meals. Eat on time.  If you are going to drink alcohol, drink it only with meals.  Check your blood glucose before driving.  Check your blood glucose before and after exercise. If you exercise longer or different than usual, be sure to check blood glucose more frequently.  Always carry treatment with you. Glucose tablets are the easiest to carry.  Always wear medical alert jewelry or carry some  form of identification that states that you have diabetes. This will alert people that you have diabetes. If you have hypoglycemia, they will have a better idea on what to do.   SEEK MEDICAL CARE IF:   You are having problems keeping your blood sugar at target range.  You are having frequent episodes of hypoglycemia.  You feel you might be having side effects from your medicines.  You have symptoms of an illness that is not improving after 3-4 days.  You notice a change in vision or a new problem with your vision.   SEEK IMMEDIATE MEDICAL CARE IF:   You are a family member or friend of a person whose blood glucose goes below 70 mg/dl and is accompanied by:  Confusion.  A change in mental status.  The inability to swallow.  Passing out.  Document Released: 01/27/2005 Document Revised: 01/16/2011 Document Reviewed: 09/21/2008 [facility] Patient Information 2012 [employer].

## 2011-10-23 NOTE — Assessment & Plan Note (Signed)
Pertinent Labs: Liver Function Tests:    Component Value Date/Time   AST 21 06/16/2011 1430   ALT 18 06/16/2011 1430   ALKPHOS 75 06/16/2011 1430   BILITOT 0.3 06/16/2011 1430   PROT 6.8 06/16/2011 1430   ALBUMIN 4.3 06/16/2011 1430    Lipid Panel:     Component Value Date/Time   CHOL 131 08/03/2010 0530   TRIG 196* 08/03/2010 0530   HDL 33* 08/03/2010 0530   CHOLHDL 4.0 08/03/2010 0530   VLDL 39 08/03/2010 0530   LDLCALC 59 08/03/2010 0530     Assessment: Goal LDL (per ATP guidelines): < 70 mg/dL (based on her uncontrolled DM2, remote significant smoking, prior symptomatic carotid artery disease)  Disease Control: controlled  Progress toward goals: at goal  Barriers to meeting goals: no barriers identified    Patient is not fasting today.   Patient is compliant most of the time with prescribed medications.    Plan:  continue current medications  Check lipid panel today.

## 2011-10-23 NOTE — Telephone Encounter (Signed)
Patient called and moved appointment to 11-03-2011 at 10:30

## 2011-10-23 NOTE — Progress Notes (Addendum)
Subjective:    Patient ID: [name]   Gender: female   DOB: [birth year]   Age: 61 y.o.   MRN: [id number]  HPI: [name] is a 61 y.o. with a PMHx of O2-dependent COPD, DMII, HTN, mitral stenosis with recent porcine valve replacement, who presented to clinic today for the following:  1) DM2, uncontrolled -  Lab Results  Component Value Date   HGBA1C 9.2 10/23/2011   Patient checking blood sugars 3 times daily, before breakfast, lunch, and dinner. Awakens at 11:00AM on average. Reports fasting blood sugars of 236-391 mg/dL. Currently taking Levemir 50 units at bedtime, Novolog TIDWC 9 units bolus + sliding scale insulin (6-10). Sliding scale is 1:25 over 150 mg/dL Patient misses doses 0 x per week on average.  0 hypoglycemic episodes since last visit, last symptomatic hypoglycemia several years ago. admits to polyuria, polydipsia because is on lasix. Denies nausea, vomiting, diarrhea.  does request refills today of fast click lancets and test strips.  In regards to diabetic complications:  Microvascular complications: Confirms: peripheral neuropathy ; Denies nephropathy and retinopathy.  Macrovascular complications: Confirms: cerebrovascular disease and peripheral vascular disease ; Denies cardiovascular disease.   Important diabetic medications: Is patient on aspirin? Yes Is patient on statin? Yes, Crestor 50m Is patient on ARB? No  2) Falls - during our last visit together, we discussed the patients prior frequency falls that were thought to be multifactorial, contributed by moderate to severe anemia, chronic gait instability, and possible narcotic effect during her last hospitalization (05/2011). She was discharged to SNF and has since been home. She has had only one fall since leaving the SNF, several months ago. PT has been helping. No lightheadedness, dizziness that is persistent, does have positional symptoms occasionally.  3) HTN - Patient does not check blood  pressure regularly at home. Currently taking lasix 892mBID. Patient misses doses 0 x per week on average. denies headaches, chest pain, shortness of breath (changed from her baseline).  does not request refills today.    Review of Systems: Per HPI.   Current Outpatient Medications: Medication Sig  . ACCU-CHEK FASTCLIX LANCETS MISC 1 each by Does not apply route 3 (three) times daily before meals. Dx code- 250.00  . acetaminophen (TYLENOL) 500 MG tablet Take 1,000 mg by mouth every 6 (six) hours as needed. For pain  . albuterol (PROVENTIL HFA;VENTOLIN HFA) 108 (90 BASE) MCG/ACT inhaler Inhale 2 puffs into the lungs every 6 (six) hours as needed. For breathing  . ALPRAZolam (XANAX) 1 MG tablet Take 1 tablet (1 mg total) by mouth 3 (three) times daily as needed for sleep or anxiety.  . Marland Kitchenspirin 81 MG tablet Take 81 mg by mouth daily with supper.   . Marland KitchenuPROPion (WELLBUTRIN XL) 150 MG 24 hr tablet Take 150 mg by mouth daily with supper.  . docusate sodium (COLACE) 50 MG capsule Take by mouth every other day. Alternate with  senokot  . DULoxetine (CYMBALTA) 30 MG capsule daily with breakfast. Take 3 tablets by mouth once daily  . Elastic Bandages & Supports (TRUFORM STOCKINGS 20-30MMHG) MISC Wear compression stockings throughout the day, and take off at nighttime.  . ferrous gluconate (FERGON) 324 MG tablet Take 1 tablet (324 mg total) by mouth 2 (two) times daily with a meal.  . Fluticasone-Salmeterol (ADVAIR DISKUS) 250-50 MCG/DOSE AEPB Inhale 1 puff into the lungs every 12 (twelve) hours.  . furosemide (LASIX) 80 MG tablet TAKE 1 TABLET BY MOUTH TWICE DAILY  .  gabapentin (NEURONTIN) 300 MG capsule Take 1 capsule (300 mg total) by mouth 2 (two) times daily.  Marland Kitchen glucose blood (BAYER CONTOUR TEST) test strip Use to test blood sugar 3 times daily.Dx code 250.00  . HYDROcodone-acetaminophen (NORCO) 5-325 MG per tablet 1-2 tablets every 6 hours as needed for pain. Do not take with alcohol, do no drive or  operate heavy machinery if drowsiness occurs. Do not take more than instructed.  . insulin aspart (NOVOLOG) 100 UNIT/ML injection Inject 9-19 Units into the skin 3 (three) times daily before meals. 7 unit base and sliding scale  . insulin detemir (LEVEMIR) 100 UNIT/ML injection Inject 50 Units into the skin at bedtime. DISPENSE 2 VIALS/ MONTH  . Insulin Pen Needle (B-D UF III MINI PEN NEEDLES) 31G X 5 MM MISC by Does not apply route 3 (three) times daily.  . Insulin Syringe-Needle U-100 (BD INSULIN SYRINGE ULTRAFINE) 31G X 5/16" 1 ML MISC by Does not apply route every morning.  . Insulin Syringe-Needle U-100 (INSULIN SYRINGE .3CC/31GX5/16") 31G X 5/16" 0.3 ML MISC 1 Syringe by Does not apply route 2 (two) times daily at 10 AM and 5 PM.  . Lancets Misc. (ACCU-CHEK FASTCLIX LANCET) KIT 1 each by Does not apply route 3 (three) times daily.  Marland Kitchen loratadine (CLARITIN) 10 MG tablet Take 10 mg by mouth daily with supper.  Marland Kitchen omeprazole (PRILOSEC) 40 MG capsule Take 40 mg by mouth 2 (two) times daily.   . potassium chloride SA (K-DUR,KLOR-CON) 20 MEQ tablet TAKE 1 TABLET BY MOUTH THREE TIMES A DAY  . rosuvastatin (CRESTOR) 20 MG tablet Take 20 mg by mouth daily with supper.  . sennosides-docusate sodium (SENOKOT-S) 8.6-50 MG tablet Take 2 tablets by mouth every other day. Hold for loose stools  . tiotropium (SPIRIVA) 18 MCG inhalation capsule Place 1 capsule (18 mcg total) into inhaler and inhale daily with breakfast.    Allergies  Allergen Reactions  . Lorazepam     Patient's sister noted that ativan caused the patient to become extremely confused during hospitalization 09/2010  . Morphine     REACTION: Unknown reaction  . Percocet (Oxycodone-Acetaminophen)     headache  . Tramadol Hcl     REACTION: swelling    Past Medical History  Diagnosis Date  . Depression   . GERD (gastroesophageal reflux disease)     Followed by [name] // small bowel capsule endoscopy (11/2009) - minimal chronic  inflammation, otherwise negative for  H.pylori, metaplasia, dysplasia  . Hypertension   . Hyperlipidemia   . Diabetes mellitus type II, uncontrolled     on insulin therapy  . COPD (chronic obstructive pulmonary disease) 2008    started home O2 (12/2010) // Prior significant smoker  // her CT of the chest there was no evidence of primary carcinoid tumor , scattered normal-sized mediastinal and hilar as well as axillary lymph nodes noted., no significant lymphadenopathy. COPD and emphysema with no acute cardiopulmonary disease noted , mild cardiomegaly and solitary calcified  plaque  . Moderate to severe pulmonary hypertension     severe per TEE (08/2010) - Peak RV-RA gradient 6m Hg  . Mitral regurgitation     mild to moderate per TEE (08/2010)  . Shingles   . Anemia     Anemia panel (08/2009) - iron 21, TIBC 441, UIBC 420,  ferritin 11 // Pt has been unable to tolerate iron supplementation due to chronic abdominal discomfort, source of which still is not clear. // Repeat EGD (05/2011)  negative, colonoscopy (05/2011) showing only int hemorrhoids, no specific cause of anemia  . History of colonic polyps     Colonoscopy (10/2005) - colon polyp with path nonadenomatous, no malignancy - by [name]. // Colonoscopy (02/2002) - 2 polyps noted, 1 - adenomatous, 2 - hyperplastic // Followed by [name] // Repeat Colonoscopy (05/2011) - single tubular adenoma polyp - performed by Dr. Michail Sermon  . Anxiety   . IBS (irritable bowel syndrome)   . Osteoporosis     DEXA (12/2004) - L-spine T score -2.6, Left hip -0.1  . Fibromyalgia   . Obstructive sleep apnea     on CPAP // Sleep study (06/2009) - Moderate sleep apnea/ hypopnea syndrome , AHI 17.8 per hour with nonpositional hypopneas. CPAP titration to 12 CWP, AHI 2.4 per hour. Small resMed Quattro full-face mask with heated humidifier  . Restless leg syndrome   . Mitral stenosis     moderate per TEE (08/2010) // Mitral valve replacement with a 27-mm  pericardial porcine valve (Medtronic Mosaic valve, serial #[id number]) on 09/20/2010 - [name] // Followed at [facility], Dr. Stanford Breed  . Bilateral carotid artery stenosis     s/p right endarterectomy (06/2010). // Carotid US (07/2010) -  60%-79% ICA stenosis, mid range of scale by velocity.  Left: Moderate-to-severe calcific and non-calcific plaque origin andproximal ICA and ECA.  60%-79% ICA stenosis, highest end of scale // Followed by [name].  . Peripheral vascular disease   . Internal hemorrhoids     small internal hemorrhoids noted on colonoscopy 05/2011  . Clear cell renal cell carcinoma 07/21/2011    S/P cryoablation of left RCC in 09/2011 by [name] // Historically - MRI Abdomen (07/2011) - 2.7 x 2.7 x 3.0 cm enhancing solid renal mass posterior aspect of the lower pole of the left kidney. // CT Abdomen in 09/2010 showed no evidence of mass // Followed by [name]  [facility])   . Irregular heart beat   . Diastolic CHF     last echo (12/2010) showing grade 2 diastolic dysfunction. Otherwise, normal EF of 55-65%    Past Surgical History  Procedure Date  . Orif clavicle fracture 01/2004    by Thana Farr. [name], M.D for Right clavicle nonunion.  . Tonsillectomy   . Carotid endarterectomy 07/04/2010    right, by [name] for asymptomatic right carotid artery stenosis  . Lipoma exision 08/2005    occipital lipoma 1.5cm - by Dr. [name]  . Hysteroscopy with endometrial ablation 06/2001    for persistent post-menopausal bleeding // by S. [name], M.D.  . Mitral valve replacement 09/20/10     with a 27-mm pericardial porcine valve (Medtronic Mosaic valve, serial #[id number]). 09/20/10, Dr Prescott Gum  . Left atrial maze procedure 09/20/10    for paroxysmal atrial fibrillation (Dr. Prescott Gum)  . Placement of left chest tube. 09/24/2010    Dr Prescott Gum  . Pr vein bypass graft,aorto-fem-pop May 2012  . Cardiac valve replacement Aug. 2012  . Colonoscopy  05/12/2011    performed by Dr. Michail Sermon. Showing small internal hemorrhoids, single tubular adenoma polyp  . Esophagogastroduodenoscopy 05/12/2011    performed by Dr. Michail Sermon. Negative for ulcerations, biopsy negative for evidence of celiac sprue  . Tubal ligation      Objective:    Physical Exam: Filed Vitals:   10/23/11 1324  BP: 137/67  Pulse: 94  Temp: 96.8 F (36 C)      General: Vital signs reviewed  and noted. Well-developed, well-nourished, in no acute distress; alert, appropriate and cooperative throughout examination.  Head: Normocephalic, atraumatic.  Nose: Mucous membranes moist, not inflammed, nonerythematous.  Throat: Oropharynx nonerythematous, no exudate appreciated.   Neck: No deformities, masses, or tenderness noted.  Lungs:  Normal respiratory effort. Clear to auscultation BL without crackles or wheezes.  Heart: RRR. S1 and S2 normal without gallop, rubs. (+) murmur.  Abdomen:  BS normoactive. Soft, Nondistended, non-tender.  No masses or organomegaly.  Extremities: No pretibial edema.    Assessment/ Plan:   Case and plan of care discussed with attending physician, Dr. Janell Quiet.

## 2011-10-23 NOTE — Assessment & Plan Note (Signed)
Assessment:  No additional falls at home. Has really benefited from PT.  Plan:      Encouraged to continue using walker, limiting attempts to walk independently  Encouraged to complete her cardiopulm rehab as is the plan  Continue home PT as long as covered.

## 2011-10-23 NOTE — Assessment & Plan Note (Addendum)
Pertinent Labs: Lab Results  Component Value Date   HGBA1C 9.2 10/23/2011   HGBA1C 7.2* 09/18/2010   CREATININE 1.03 10/07/2011   CREATININE 1.2 09/30/2011   MICROALBUR 0.59 05/29/2011   MICRALBCREAT 13.4 05/29/2011   CHOL 131 08/03/2010   HDL 33* 08/03/2010   TRIG 196* 08/03/2010    Assessment: Disease Control: not controlled  Progress toward goals: deteriorated  Barriers to meeting goals: no barriers identified  Microvascular complications: peripheral neuropathy  Macrovascular complications: cerebrovascular disease and peripheral vascular disease  On aspirin: yes  On statin: Yes, Crestor 68m  On ACE-I/ARB: No - was previously, unclear why discontinued. No prior adverse reactions reported.    Patient's blood sugars remain elevated throughout the day in the 200-300s, but do not progressively increase after eating. This therefore indicates that she likely is not getting enough basal insulin. However, her current meal-time coverage seems to be appropriately covering her meals.    Patient is compliant most of the time with prescribed medications.   Plan: Glucometer log was reviewed today, as pt did have glucometer available for review.   Escalate Levemir to 56 units qday  Continue meal coverage at current. I think that as her basal blood sugar levels are better controlled, she will need less correctional insulin at meals.  reminded to get eye exam and reminded to bring blood glucose meter & log to each visit  Will start ACE-I.

## 2011-10-23 NOTE — Assessment & Plan Note (Addendum)
Health Maintenance  Topic Date Due  . Zostavax  04/23/2009  . Lipid Panel  08/03/2011  . Ophthalmology Exam  08/13/2011  . Influenza Vaccine  11/11/2011  . Hemoglobin A1c  01/22/2012  . Urine Microalbumin  05/28/2012  . Foot Exam  10/22/2012  . Pap Smear  02/14/2013  . Mammogram  07/29/2013  . Pneumococcal Polysaccharide Vaccine (#2) 03/26/2014  . Colonoscopy  05/11/2016  . Tetanus/tdap  10/18/2016    Assessment:  Due for Zostavax  Due for lipid panel, A1c, foot exam, eye exam.  Plan:  Discussed with pt and sent Rx for Zostavax to her [place].  Lipid panel today (pt ate lunch just prior to visit - but nothing very greasy)  A1c, foot exam today.  Referral for diabetic eye exam deferred as patient wants to make her own appointment.  NEEDS FLU SHOT NEXT VISIT.

## 2011-10-23 NOTE — Assessment & Plan Note (Signed)
Pertinent Data: BP Readings from Last 3 Encounters:  10/23/11 137/67  10/23/11 142/55  09/30/11 353/91    Basic Metabolic Panel:    Component Value Date/Time   NA 139 09/30/2011 1130   K 3.7 09/30/2011 1130   CL 98 09/30/2011 1130   CO2 31 09/30/2011 1130   BUN 21 10/07/2011 0904   CREATININE 1.03 10/07/2011 0904   CREATININE 1.2 09/30/2011 1130   GLUCOSE 173* 09/30/2011 1130   CALCIUM 9.5 09/30/2011 1130    Assessment: Disease Control: controlled  Progress toward goals: at goal  Barriers to meeting goals: no barriers identified    Patient is not currently on an ACE-I for unclear reasons - does not report history of cough with ACE-I or any abnormal/ adverse reaction.    Patient is compliant most of the time with prescribed medications.   Plan:  continue current medications  Add lisinopril 10 mg daily.  Recheck BMET next visit.

## 2011-10-23 NOTE — Progress Notes (Signed)
Patient ID: [name], female   DOB: 01-17-1951, 61 y.o.   MRN: [id number]  ESTABLISHED PATIENT OFFICE VISIT  Chief Complaint: Status post percutaneous cryoablation of a left renal cell carcinoma on 09/19/2011.  History: Mrs. Goughnour returns for follow-up 1 month after cryoablation. Core biopsy at the time of renal ablation demonstrated evidence of clear cell renal carcinoma, Fuhrman nuclear grade 2. The patient has done well after the procedure with no evident significant complication. She had some abdominal bloating and gas today. She describes some occasional left flank discomfort which is not bothersome and not requiring pain medication.  Review of Systems: No hematuria, dysuria or fever. No nausea, vomiting or diarrhea.  Exam: Vital signs: Blood pressure 142/55, pulse 89, respirations 16, temperature 98.2, oxygen saturation 98% on 1 liter of oxygen. General: No acute distress. Abdomen: Soft and nontender. No flank tenderness.  Labs: BUN 21, creatinine 1.03, estimated GFR 58 ml/minute.  Imaging: Follow-up CT was performed today and demonstrates expected post ablation changes of the lower left kidney without evidence of visible enhancing tumor. There is no evidence of complication following the procedure.  Assessment and Plan: I met with the patient and her sister. We directly reviewed CT imaging. I told the patient that initial appearance of the ablation zone appears adequate to treat the renal carcinoma. There is no evidence of complication after the procedure. Renal function is stable. I have recommended follow-up imaging in February at which time the patient will be 6 months post ablation.

## 2011-10-24 LAB — LIPID PANEL
Cholesterol: 130 mg/dL (ref 0–200)
HDL: 33 mg/dL — ABNORMAL LOW (ref >39)
LDL Cholesterol: 34 mg/dL (ref 0–99)
Total CHOL/HDL Ratio: 3.9 Ratio
Triglycerides: 317 mg/dL — ABNORMAL HIGH (ref <150)
VLDL: 63 mg/dL — ABNORMAL HIGH (ref 0–40)

## 2011-10-24 LAB — BASIC METABOLIC PANEL
BUN: 20 mg/dL (ref 6–23)
CO2: 30 mEq/L (ref 19–32)
Calcium: 9.9 mg/dL (ref 8.4–10.5)
Chloride: 99 mEq/L (ref 96–112)
Creat: 1.05 mg/dL (ref 0.50–1.10)
Glucose, Bld: 213 mg/dL — ABNORMAL HIGH (ref 70–99)
Potassium: 4.2 mEq/L (ref 3.5–5.3)
Sodium: 140 mEq/L (ref 135–145)

## 2011-10-24 NOTE — Progress Notes (Signed)
Quick Note:  Lipid panel - patient had eaten just prior to labs, likely cause of acutely elevated triglycerides.   Renal function looks good. ______

## 2011-10-27 ENCOUNTER — Ambulatory Visit: Payer: Medicare Other | Admitting: Oncology

## 2011-11-03 ENCOUNTER — Telehealth: Payer: Self-pay | Admitting: *Deleted

## 2011-11-03 ENCOUNTER — Ambulatory Visit (HOSPITAL_BASED_OUTPATIENT_CLINIC_OR_DEPARTMENT_OTHER): Payer: Medicare Other | Admitting: Oncology

## 2011-11-03 ENCOUNTER — Ambulatory Visit (HOSPITAL_BASED_OUTPATIENT_CLINIC_OR_DEPARTMENT_OTHER): Payer: Medicare Other | Admitting: Lab

## 2011-11-03 ENCOUNTER — Encounter: Payer: Self-pay | Admitting: Oncology

## 2011-11-03 VITALS — BP 112/61 | HR 89 | Temp 97.8°F | Resp 20 | Ht 62.0 in | Wt 185.3 lb

## 2011-11-03 DIAGNOSIS — C649 Malignant neoplasm of unspecified kidney, except renal pelvis: Secondary | ICD-10-CM

## 2011-11-03 DIAGNOSIS — D649 Anemia, unspecified: Secondary | ICD-10-CM

## 2011-11-03 DIAGNOSIS — D638 Anemia in other chronic diseases classified elsewhere: Secondary | ICD-10-CM

## 2011-11-03 LAB — CBC WITH DIFFERENTIAL/PLATELET
BASO%: 0.5 % (ref 0.0–2.0)
Basophils Absolute: 0 10*3/uL (ref 0.0–0.1)
EOS%: 2 % (ref 0.0–7.0)
Eosinophils Absolute: 0.2 10*3/uL (ref 0.0–0.5)
HCT: 38 % (ref 34.8–46.6)
HGB: 12.3 g/dL (ref 11.6–15.9)
LYMPH%: 21.8 % (ref 14.0–49.7)
MCH: 29.8 pg (ref 25.1–34.0)
MCHC: 32.4 g/dL (ref 31.5–36.0)
MCV: 92 fL (ref 79.5–101.0)
MONO#: 0.8 10*3/uL (ref 0.1–0.9)
MONO%: 10.7 % (ref 0.0–14.0)
NEUT#: 5.1 10*3/uL (ref 1.5–6.5)
NEUT%: 65 % (ref 38.4–76.8)
Platelets: 237 10*3/uL (ref 145–400)
RBC: 4.13 10*6/uL (ref 3.70–5.45)
RDW: 16 % — ABNORMAL HIGH (ref 11.2–14.5)
WBC: 7.9 10*3/uL (ref 3.9–10.3)
lymph#: 1.7 10*3/uL (ref 0.9–3.3)
nRBC: 0 % (ref 0–0)

## 2011-11-03 NOTE — Telephone Encounter (Signed)
01-26-2012 starting at 9:00am

## 2011-11-03 NOTE — Patient Instructions (Addendum)
Check CBC today  I will see you back in 3 months  We will call you with results of todays labs

## 2011-11-03 NOTE — Progress Notes (Signed)
OFFICE PROGRESS NOTE  CC  [name], DO [address] [place] [address]  DIAGNOSIS: 61 year old female with chronic anemia unclear etiology  PRIOR THERAPY:   #1 anemia of chronic disease.  #2 clear cell renal cell carcinoma status post RFA by interventional radiology.  CURRENT THERAPY: Observation  INTERVAL HISTORY: [name] 61 y.o. female returns for Visit today . She has had RFA for the left renal cell carcinoma. Overall she is doing well with it without any problems. Today she feels well she has no nausea vomiting fevers chills night sweats headaches shortness of breath chest pains or palpitations no myalgias or arthralgias. We did not check a CBC on her and we will do so. Remainder of the 10 point review of systems is negative.  MEDICAL HISTORY: Past Medical History  Diagnosis Date  . Depression   . GERD (gastroesophageal reflux disease)     Followed by [name] // small bowel capsule endoscopy (11/2009) - minimal chronic inflammation, otherwise negative for  H.pylori, metaplasia, dysplasia  . Hypertension   . Hyperlipidemia   . Diabetes mellitus type II, uncontrolled     on insulin therapy  . COPD (chronic obstructive pulmonary disease) 2008    started home O2 (12/2010) // Prior significant smoker  // her CT of the chest there was no evidence of primary carcinoid tumor , scattered normal-sized mediastinal and hilar as well as axillary lymph nodes noted., no significant lymphadenopathy. COPD and emphysema with no acute cardiopulmonary disease noted , mild cardiomegaly and solitary calcified  plaque  . Moderate to severe pulmonary hypertension     severe per TEE (08/2010) - Peak RV-RA gradient 76m Hg  . Mitral regurgitation     mild to moderate per TEE (08/2010)  . Shingles   . Anemia     Anemia panel (08/2009) - iron 21, TIBC 441, UIBC 420,  ferritin 11 // Pt has been unable to tolerate iron supplementation due to chronic abdominal  discomfort, source of which still is not clear. // Repeat EGD (05/2011) negative, colonoscopy (05/2011) showing only int hemorrhoids, no specific cause of anemia  . History of colonic polyps     Colonoscopy (10/2005) - colon polyp with path nonadenomatous, no malignancy - by Dr. [name] // Colonoscopy (02/2002) - 2 polyps noted, 1 - adenomatous, 2 - hyperplastic // Followed by Dr. [name]// Repeat Colonoscopy (05/2011) - single tubular adenoma polyp - performed by Dr. [name] . Anxiety   . IBS (irritable bowel syndrome)   . Osteoporosis     DEXA (12/2004) - L-spine T score -2.6, Left hip -0.1  . Fibromyalgia   . Obstructive sleep apnea     on CPAP // Sleep study (06/2009) - Moderate sleep apnea/ hypopnea syndrome , AHI 17.8 per hour with nonpositional hypopneas. CPAP titration to 12 CWP, AHI 2.4 per hour. Small resMed Quattro full-face mask with heated humidifier  . Restless leg syndrome   . Mitral stenosis     moderate per TEE (08/2010) // Mitral valve replacement with a 27-mm pericardial porcine valve (Medtronic Mosaic valve, serial ##[id number] on 09/20/2010 - PIvin Poot// [name] . Bilateral carotid artery stenosis     s/p right endarterectomy (06/2010). // Carotid [place](07/2010) -  60%-79% ICA stenosis, mid range of scale by velocity.  Left: Moderate-to-severe calcific and non-calcific plaque origin andproximal ICA and ECA.  60%-79% ICA stenosis, highest end of scale // Followed by [name]  .  Peripheral vascular disease   . Internal hemorrhoids     small internal hemorrhoids noted on colonoscopy 05/2011  . Clear cell renal cell carcinoma 07/21/2011    S/P cryoablation of left RCC in 09/2011 by [name] // Historically - MRI Abdomen (07/2011) - 2.7 x 2.7 x 3.0 cm enhancing solid renal mass posterior aspect of the lower pole of the left kidney. // CT Abdomen in 09/2010 showed no evidence of mass // Followed by [name]  [facility])     . Irregular heart beat   . Diastolic CHF     last echo (12/2010) showing grade 2 diastolic dysfunction. Otherwise, normal EF of 55-65%    ALLERGIES:  is allergic to lorazepam; morphine; percocet; and tramadol hcl.  MEDICATIONS:  Current Outpatient Prescriptions  Medication Sig Dispense Refill  . ACCU-CHEK FASTCLIX LANCETS MISC 1 each by Does not apply route 3 (three) times daily before meals. Check blood sugars 3 times daily before breakfast, lunch, and dinner DX code: 250.02  102 each  12  . acetaminophen (TYLENOL) 500 MG tablet Take 1,000 mg by mouth every 6 (six) hours as needed. For pain      . albuterol (PROVENTIL HFA;VENTOLIN HFA) 108 (90 BASE) MCG/ACT inhaler Inhale 2 puffs into the lungs every 6 (six) hours as needed. For breathing      . ALPRAZolam (XANAX) 1 MG tablet Take 1 tablet (1 mg total) by mouth 3 (three) times daily as needed for sleep or anxiety.  90 tablet  5  . aspirin 81 MG tablet Take 81 mg by mouth daily with supper.       Marland Kitchen buPROPion (WELLBUTRIN XL) 150 MG 24 hr tablet Take 150 mg by mouth daily with supper.      . docusate sodium (COLACE) 50 MG capsule Take by mouth every other day. Alternate with  senokot      . DULoxetine (CYMBALTA) 30 MG capsule daily with breakfast. Take 3 tablets by mouth once daily      . Elastic Bandages & Supports (TRUFORM STOCKINGS 20-30MMHG) MISC Wear compression stockings throughout the day, and take off at nighttime.  2 each  1  . ferrous gluconate (FERGON) 324 MG tablet Take 1 tablet (324 mg total) by mouth 2 (two) times daily with a meal.  120 tablet  3  . Fluticasone-Salmeterol (ADVAIR DISKUS) 250-50 MCG/DOSE AEPB Inhale 1 puff into the lungs every 12 (twelve) hours.  1 each  11  . furosemide (LASIX) 80 MG tablet TAKE 1 TABLET BY MOUTH TWICE DAILY  60 tablet  12  . gabapentin (NEURONTIN) 300 MG capsule Take 1 capsule (300 mg total) by mouth 2 (two) times daily.  60 capsule  5  . glucose blood (BAYER CONTOUR TEST) test strip Use to  test blood sugar 3 times daily.Dx code 250.00  100 each  12  . HYDROcodone-acetaminophen (NORCO) 5-325 MG per tablet 1-2 tablets every 6 hours as needed for pain. Do not take with alcohol, do no drive or operate heavy machinery if drowsiness occurs. Do not take more than instructed.  240 tablet  1  . insulin aspart (NOVOLOG) 100 UNIT/ML injection Inject 9-19 Units into the skin 3 (three) times daily before meals. 7 unit base and sliding scale      . insulin detemir (LEVEMIR) 100 UNIT/ML injection Inject 56 Units into the skin at bedtime. DISPENSE 2 VIALS/ MONTH  20 mL  5  . Insulin Pen Needle (B-D UF III MINI PEN NEEDLES) 31G  X 5 MM MISC by Does not apply route 3 (three) times daily.      . Insulin Syringe-Needle U-100 (INSULIN SYRINGE .3CC/31GX5/16") 31G X 5/16" 0.3 ML MISC 1 Syringe by Does not apply route 2 (two) times daily at 10 AM and 5 PM.  100 each  11  . lisinopril (PRINIVIL,ZESTRIL) 10 MG tablet Take 1 tablet (10 mg total) by mouth daily.  30 tablet  3  . loratadine (CLARITIN) 10 MG tablet Take 10 mg by mouth daily with supper.      Marland Kitchen omeprazole (PRILOSEC) 40 MG capsule Take 20 mg by mouth 2 (two) times daily.       . potassium chloride SA (K-DUR,KLOR-CON) 20 MEQ tablet TAKE 1 TABLET BY MOUTH THREE TIMES A DAY  90 tablet  3  . rosuvastatin (CRESTOR) 20 MG tablet Take 20 mg by mouth daily with supper.      . sennosides-docusate sodium (SENOKOT-S) 8.6-50 MG tablet Take 2 tablets by mouth every other day. Hold for loose stools      . tiotropium (SPIRIVA) 18 MCG inhalation capsule Place 1 capsule (18 mcg total) into inhaler and inhale daily with breakfast.  30 capsule  11    SURGICAL HISTORY:  Past Surgical History  Procedure Date  . Orif clavicle fracture 01/2004    by Thana Farr. [name], M.D for Right clavicle nonunion.  . Tonsillectomy   . Carotid endarterectomy 07/04/2010    right, by [name] for asymptomatic right carotid artery stenosis  . Lipoma exision 08/2005    occipital lipoma  1.5cm - by Dr. [name]  . Hysteroscopy with endometrial ablation 06/2001    for persistent post-menopausal bleeding // by S. [name], M.D.  . Mitral valve replacement 09/20/10     with a 27-mm pericardial porcine valve (Medtronic Mosaic valve, serial #[id number]). 09/20/10, Dr Prescott Gum  . Left atrial maze procedure 09/20/10    for paroxysmal atrial fibrillation (Dr. Prescott Gum)  . Placement of left chest tube. 09/24/2010    Dr Prescott Gum  . Pr vein bypass graft,aorto-fem-pop May 2012  . Cardiac valve replacement Aug. 2012  . Colonoscopy 05/12/2011    performed by Dr. Michail Sermon. Showing small internal hemorrhoids, single tubular adenoma polyp  . Esophagogastroduodenoscopy 05/12/2011    performed by Dr. Michail Sermon. Negative for ulcerations, biopsy negative for evidence of celiac sprue  . Tubal ligation     REVIEW OF SYSTEMS:  Pertinent items are noted in HPI.   PHYSICAL EXAMINATION: not performed  ECOG PERFORMANCE STATUS: 2 - Symptomatic, <50% confined to bed  Blood pressure 112/61, pulse 89, temperature 97.8 F (36.6 C), temperature source Oral, resp. rate 20, height _0  (1.575 m), weight 185 lb 4.8 oz (84.052 kg).  LABORATORY DATA: Lab Results  Component Value Date   WBC 12.7* 09/21/2011   HGB 10.5* 09/21/2011   HCT 31.0* 09/21/2011   MCV 89.6 09/21/2011   PLT 193 09/21/2011      Chemistry      Component Value Date/Time   NA 140 10/23/2011 1417   K 4.2 10/23/2011 1417   CL 99 10/23/2011 1417   CO2 30 10/23/2011 1417   BUN 20 10/23/2011 1417   CREATININE 1.05 10/23/2011 1417   CREATININE 1.2 09/30/2011 1130      Component Value Date/Time   CALCIUM 9.9 10/23/2011 1417   ALKPHOS 75 06/16/2011 1430   AST 21 06/16/2011 1430   ALT 18 06/16/2011 1430   BILITOT 0.3 06/16/2011 1430  RADIOGRAPHIC STUDIES: MRI ABDOMEN WITH AND WITHOUT CONTRAST  Technique: Multiplanar multisequence MR imaging of the abdomen was  performed both before and after administration of intravenous  contrast.    Contrast: 6m MULTIHANCE GADOBENATE DIMEGLUMINE 529 MG/ML IV SOLN  Comparison: Abdominal ultrasound 07/01/2011.  Findings: In the posterior aspect of the lower pole of the left  kidney there is a 2.7 x 2.7 x 3.0 cm lesion that it is  heterogeneous in signal intensity on pre-gadolinium T1 and T2-  weighted sequences, but demonstrates avid heterogeneous enhancement  on post-gadolinium images, consistent with a solid renal neoplasm.  This lesion also demonstrates heterogeneous high signal intensity  on diffusion weighted imaging (and low signal intensity on ADC  map). This appears relatively well defined and appears to have an  external capsule. At this time, there is no gross abnormality of  the adjacent pararenal fat to suggest direct invasion.  Additionally, there is no associated regional adenopathy. There  are several other tiny subcentimeter lesions in the kidneys  bilaterally which are high signal intensity on T2-weighted images,  low signal intensity on pre Gadolinium T1-weighted images, and do  not enhance, compatible with simple cysts.  On out of phase dual echo imaging there is slight decrease in  signal intensity throughout the hepatic parenchyma, compatible with  mild hepatic steatosis. There are no focal hepatic lesions  identified. The appearance of the gallbladder, pancreas,  visualized spleen and right adrenal gland is unremarkable. There  is slight thickening of the left adrenal gland without a definite  dominant nodule, however, the left adrenal gland also loses signal  intensity on out-of-phase dual echo imaging, suggesting adenomatous  hyperplasia.  There appear to be single renal arteries bilaterally. Notably,  there is extensive atherosclerosis of the abdominal aorta, and this  appears to be relatively severe distally aortic caliber is narrowed  to 1.5 x 0.9 cm. The visualized portions of the proximal common  iliac arteries appear very small caliber bilaterally.   IMPRESSION:  1. 2.7 x 2.7 x 3.0 cm enhancing solid renal neoplasm in the  posterior aspect of the lower pole of the left kidney. This  appears relatively well encapsulated at this time, and there is no  definite associated regional adenopathy. This is highly suspicious  for a renal cell carcinoma and the findings suggest T1a, N0, Mx  disease (i.e., likely stage I disease).  2. Hepatic steatosis.  3. Adenomatous hypertrophy of the left adrenal gland.  4. Atherosclerosis, as above.    ASSESSMENT:61year old female who originally saw me for chronic anemia of unclear etiology. On her workup for anemia she was found to have a elevated erythropoietin level. Because of this we did do an ultrasound of the abdomen that showed a possibility of a renal mass on the left kidney. She then went on to get an MRI of the abdomen performed that now confirms the mass in the lower pole of the left kidney it measures 3.0 cm. patient was seen by Dr. SAnnie MainDahlStead who referred her to Dr. GMonica Martinez got up. Patient since then has gone on to have reduced radiofrequency ablation of the renal mass. She overall tolerated it well. She had a followup CT performed and Dr. ETerrence Dupontguide I was quite pleased with the results.  PLAN: #1 I will check a CBC today and we will call the patient regarding her results.  #2 in the meantime I will set her up to see her back in 3 months time. However if her CBC  today showed that his flow specifically the hemoglobin then she may be a good candidate for PSA therapy with Aranesp or Procrit. I have explained this to her.  #3 the meantime she will continue to follow with Dr. Dimas Millin  All questions were answered. The patient knows to call the clinic with any problems, questions or concerns. We can certainly see the patient much sooner if necessary.  I spent 20 minutes counseling the patient face to face. The total time spent in the appointment was 30 minutes.    Marcy Panning,  MD Medical/Oncology [facility] [phone] (beeper) [phone] (Office)  11/03/2011, 11:19 AM

## 2011-11-18 ENCOUNTER — Other Ambulatory Visit: Payer: Self-pay | Admitting: *Deleted

## 2011-11-18 DIAGNOSIS — D649 Anemia, unspecified: Secondary | ICD-10-CM

## 2011-11-18 DIAGNOSIS — E119 Type 2 diabetes mellitus without complications: Secondary | ICD-10-CM

## 2011-11-18 DIAGNOSIS — I10 Essential (primary) hypertension: Secondary | ICD-10-CM

## 2011-11-18 DIAGNOSIS — K219 Gastro-esophageal reflux disease without esophagitis: Secondary | ICD-10-CM

## 2011-11-18 DIAGNOSIS — F419 Anxiety disorder, unspecified: Secondary | ICD-10-CM

## 2011-11-18 DIAGNOSIS — R252 Cramp and spasm: Secondary | ICD-10-CM

## 2011-11-18 DIAGNOSIS — J449 Chronic obstructive pulmonary disease, unspecified: Secondary | ICD-10-CM

## 2011-11-19 MED ORDER — ALPRAZOLAM 1 MG PO TABS: 1.0000 mg | ORAL_TABLET | Freq: Three times a day (TID) | ORAL | Status: DC | PRN

## 2011-11-20 ENCOUNTER — Ambulatory Visit: Payer: Medicare Other | Admitting: Internal Medicine

## 2011-11-20 NOTE — Progress Notes (Signed)
Canceled appointment. Added orders for DEXA and pulm rehab per patient request when I called her.

## 2011-11-20 NOTE — Telephone Encounter (Signed)
Rx called in

## 2011-11-25 ENCOUNTER — Telehealth: Payer: Self-pay | Admitting: *Deleted

## 2011-11-25 ENCOUNTER — Encounter: Payer: Self-pay | Admitting: Internal Medicine

## 2011-11-25 ENCOUNTER — Other Ambulatory Visit: Payer: Self-pay | Admitting: Dietician

## 2011-11-25 ENCOUNTER — Ambulatory Visit (INDEPENDENT_AMBULATORY_CARE_PROVIDER_SITE_OTHER): Payer: Medicare Other | Admitting: Internal Medicine

## 2011-11-25 VITALS — BP 116/70 | HR 90 | Ht 62.0 in | Wt 185.2 lb

## 2011-11-25 DIAGNOSIS — IMO0001 Reserved for inherently not codable concepts without codable children: Secondary | ICD-10-CM

## 2011-11-25 DIAGNOSIS — IMO0002 Reserved for concepts with insufficient information to code with codable children: Secondary | ICD-10-CM

## 2011-11-25 DIAGNOSIS — I10 Essential (primary) hypertension: Secondary | ICD-10-CM

## 2011-11-25 DIAGNOSIS — E1165 Type 2 diabetes mellitus with hyperglycemia: Secondary | ICD-10-CM

## 2011-11-25 DIAGNOSIS — Z299 Encounter for prophylactic measures, unspecified: Secondary | ICD-10-CM

## 2011-11-25 DIAGNOSIS — Z23 Encounter for immunization: Secondary | ICD-10-CM

## 2011-11-25 DIAGNOSIS — L219 Seborrheic dermatitis, unspecified: Secondary | ICD-10-CM | POA: Insufficient documentation

## 2011-11-25 DIAGNOSIS — M62830 Muscle spasm of back: Secondary | ICD-10-CM | POA: Insufficient documentation

## 2011-11-25 DIAGNOSIS — M5380 Other specified dorsopathies, site unspecified: Secondary | ICD-10-CM

## 2011-11-25 LAB — BASIC METABOLIC PANEL
BUN: 18 mg/dL (ref 6–23)
CO2: 27 mEq/L (ref 19–32)
Calcium: 9.4 mg/dL (ref 8.4–10.5)
Chloride: 98 mEq/L (ref 96–112)
Creat: 0.94 mg/dL (ref 0.50–1.10)
Glucose, Bld: 201 mg/dL — ABNORMAL HIGH (ref 70–99)
Potassium: 4.6 mEq/L (ref 3.5–5.3)
Sodium: 137 mEq/L (ref 135–145)

## 2011-11-25 LAB — GLUCOSE, CAPILLARY: Glucose-Capillary: 179 mg/dL — ABNORMAL HIGH (ref 70–99)

## 2011-11-25 MED ORDER — CYCLOBENZAPRINE HCL 5 MG PO TABS: 5.0000 mg | ORAL_TABLET | Freq: Three times a day (TID) | ORAL | Status: DC | PRN

## 2011-11-25 NOTE — Assessment & Plan Note (Signed)
History and physical exam suggestive of muscular spasm.  She cannot discern if symptoms are better after she takes alprazolam at night because she goes directly to bed, but does note no pain in the morning, or currently.  Trial of Cyclobenzaprine 34m q8h prn #30.

## 2011-11-25 NOTE — Assessment & Plan Note (Signed)
Lab Results  Component Value Date   NA 140 10/23/2011   K 4.2 10/23/2011   CL 99 10/23/2011   CO2 30 10/23/2011   BUN 20 10/23/2011   CREATININE 1.05 10/23/2011   CREATININE 1.2 09/30/2011    BP Readings from Last 3 Encounters:  11/25/11 116/70  11/03/11 112/61  10/23/11 137/67    Assessment: Hypertension control:  controlled  Progress toward goals:  at goal Barriers to meeting goals:  no barriers identified  Plan: Hypertension treatment:  continue current medications, including lisinopril (no reports of cough/angioedema) - check BMET today

## 2011-11-25 NOTE — Telephone Encounter (Signed)
Called patient left voice message for Dexa appt. : [facility] Oct. 29.013 @ 10:15am / arrive 10:00am [name] NT 10-15-013  10:41am

## 2011-11-25 NOTE — Progress Notes (Signed)
Subjective:   Patient ID: [name] female   DOB: November 16, 1950 61 y.o.   MRN: [id number]  HPI: [name] is a 61 y.o. woman with a PMHx of O2-dependent COPD, DMII, HTN, mitral stenosis with recent porcine valve replacement.She also notes history of clear cell renal cell carcinoma s/p RFA by IR.  She comes today because [name] was seen by PCP in September and started on lisinopril, and was told she needed to come in for blood work.  Left mid abdominal mole.  Accidentally scratched it and it started bleeding.  Was painful when she scratched it.  Unable to describe change. Does not know how long it has existed.  Reports  Left flank and left abdomen pain  - 6 weeks ago, not long after seen by [name] (1 month after IR guided tissue ablation of clear cell carcinoma of left kidney)  Better with warm compress.   Has appt with Dr. Ishmael Holter next week.  Was crampy/spastic like, was constant. Now intermittent, tender to touch.  Taking omeprazole 2 BID   Past Medical History  Diagnosis Date  . Depression   . GERD (gastroesophageal reflux disease)     Followed by [name] // small bowel capsule endoscopy (11/2009) - minimal chronic inflammation, otherwise negative for  H.pylori, metaplasia, dysplasia  . Hypertension   . Hyperlipidemia   . Diabetes mellitus type II, uncontrolled     on insulin therapy  . COPD (chronic obstructive pulmonary disease) 2008    started home O2 (12/2010) // Prior significant smoker  // her CT of the chest there was no evidence of primary carcinoid tumor , scattered normal-sized mediastinal and hilar as well as axillary lymph nodes noted., no significant lymphadenopathy. COPD and emphysema with no acute cardiopulmonary disease noted , mild cardiomegaly and solitary calcified  plaque  . Moderate to severe pulmonary hypertension     severe per TEE (08/2010) - Peak RV-RA gradient 73m Hg  . Mitral regurgitation     mild to moderate per TEE (08/2010)  .  Shingles   . Anemia     Anemia panel (08/2009) - iron 21, TIBC 441, UIBC 420,  ferritin 11 // Pt has been unable to tolerate iron supplementation due to chronic abdominal discomfort, source of which still is not clear. // Repeat EGD (05/2011) negative, colonoscopy (05/2011) showing only int hemorrhoids, no specific cause of anemia  . History of colonic polyps     Colonoscopy (10/2005) - colon polyp with path nonadenomatous, no malignancy - by Dr. [name] // Colonoscopy (02/2002) - 2 polyps noted, 1 - adenomatous, 2 - hyperplastic // Followed by Dr. [name]// Repeat Colonoscopy (05/2011) - single tubular adenoma polyp - performed by Dr. [name] . Anxiety   . IBS (irritable bowel syndrome)   . Osteoporosis     DEXA (12/2004) - L-spine T score -2.6, Left hip -0.1  . Fibromyalgia   . Obstructive sleep apnea     on CPAP // Sleep study (06/2009) - Moderate sleep apnea/ hypopnea syndrome , AHI 17.8 per hour with nonpositional hypopneas. CPAP titration to 12 CWP, AHI 2.4 per hour. Small resMed Quattro full-face mask with heated humidifier  . Restless leg syndrome   . Mitral stenosis     moderate per TEE (08/2010) // Mitral valve replacement with a 27-mm pericardial porcine valve (Medtronic Mosaic valve, serial ##[id number] on 09/20/2010 - PIvin Horn// [facility] [name] . Bilateral carotid artery stenosis  s/p right endarterectomy (06/2010). // Carotid US (07/2010) -  60%-79% ICA stenosis, mid range of scale by velocity.  Left: Moderate-to-severe calcific and non-calcific plaque origin andproximal ICA and ECA.  60%-79% ICA stenosis, highest end of scale // Followed by [name].  . Peripheral vascular disease   . Internal hemorrhoids     small internal hemorrhoids noted on colonoscopy 05/2011  . Clear cell renal cell carcinoma 07/21/2011    S/P cryoablation of left RCC in 09/2011 by [name] // Historically - MRI Abdomen (07/2011) - 2.7 x 2.7 x 3.0 cm enhancing  solid renal mass posterior aspect of the lower pole of the left kidney. // CT Abdomen in 09/2010 showed no evidence of mass // Followed by [name]  [facility])   . Irregular heart beat   . Diastolic CHF     last echo (12/2010) showing grade 2 diastolic dysfunction. Otherwise, normal EF of 55-65%   Current Outpatient Prescriptions  Medication Sig Dispense Refill  . ACCU-CHEK FASTCLIX LANCETS MISC 1 each by Does not apply route 3 (three) times daily before meals. Check blood sugars 3 times daily before breakfast, lunch, and dinner DX code: 250.02  102 each  12  . acetaminophen (TYLENOL) 500 MG tablet Take 1,000 mg by mouth every 6 (six) hours as needed. For pain      . albuterol (PROVENTIL HFA;VENTOLIN HFA) 108 (90 BASE) MCG/ACT inhaler Inhale 2 puffs into the lungs every 6 (six) hours as needed. For breathing      . ALPRAZolam (XANAX) 1 MG tablet Take 1 tablet (1 mg total) by mouth 3 (three) times daily as needed for sleep or anxiety.  90 tablet  5  . aspirin 81 MG tablet Take 81 mg by mouth daily with supper.       Marland Kitchen buPROPion (WELLBUTRIN XL) 150 MG 24 hr tablet Take 150 mg by mouth daily with supper.      . docusate sodium (COLACE) 50 MG capsule Take by mouth every other day. Alternate with  senokot      . DULoxetine (CYMBALTA) 30 MG capsule daily with breakfast. Take 3 tablets by mouth once daily      . Elastic Bandages & Supports (TRUFORM STOCKINGS 20-30MMHG) MISC Wear compression stockings throughout the day, and take off at nighttime.  2 each  1  . ferrous gluconate (FERGON) 324 MG tablet Take 1 tablet (324 mg total) by mouth 2 (two) times daily with a meal.  120 tablet  3  . Fluticasone-Salmeterol (ADVAIR DISKUS) 250-50 MCG/DOSE AEPB Inhale 1 puff into the lungs every 12 (twelve) hours.  1 each  11  . furosemide (LASIX) 80 MG tablet TAKE 1 TABLET BY MOUTH TWICE DAILY  60 tablet  12  . gabapentin (NEURONTIN) 300 MG capsule Take 1 capsule (300 mg total) by mouth 2 (two) times  daily.  60 capsule  5  . glucose blood (BAYER CONTOUR TEST) test strip Use to test blood sugar 3 times daily.Dx code 250.00  100 each  12  . HYDROcodone-acetaminophen (NORCO) 5-325 MG per tablet 1-2 tablets every 6 hours as needed for pain. Do not take with alcohol, do no drive or operate heavy machinery if drowsiness occurs. Do not take more than instructed.  240 tablet  1  . insulin aspart (NOVOLOG) 100 UNIT/ML injection Inject 9-19 Units into the skin 3 (three) times daily before meals. 7 unit base and sliding scale      . insulin detemir (LEVEMIR) 100 UNIT/ML  injection Inject 56 Units into the skin at bedtime. DISPENSE 2 VIALS/ MONTH  20 mL  5  . Insulin Pen Needle (B-D UF III MINI PEN NEEDLES) 31G X 5 MM MISC by Does not apply route 3 (three) times daily.      . Insulin Syringe-Needle U-100 (INSULIN SYRINGE .3CC/31GX5/16") 31G X 5/16" 0.3 ML MISC 1 Syringe by Does not apply route 2 (two) times daily at 10 AM and 5 PM.  100 each  11  . lisinopril (PRINIVIL,ZESTRIL) 10 MG tablet Take 1 tablet (10 mg total) by mouth daily.  30 tablet  3  . loratadine (CLARITIN) 10 MG tablet Take 10 mg by mouth daily with supper.      Marland Kitchen omeprazole (PRILOSEC) 40 MG capsule Take 20 mg by mouth 2 (two) times daily.       . potassium chloride SA (K-DUR,KLOR-CON) 20 MEQ tablet TAKE 1 TABLET BY MOUTH THREE TIMES A DAY  90 tablet  3  . rosuvastatin (CRESTOR) 20 MG tablet Take 20 mg by mouth daily with supper.      . sennosides-docusate sodium (SENOKOT-S) 8.6-50 MG tablet Take 2 tablets by mouth every other day. Hold for loose stools      . tiotropium (SPIRIVA) 18 MCG inhalation capsule Place 1 capsule (18 mcg total) into inhaler and inhale daily with breakfast.  30 capsule  11   Family History  Problem Relation Age of Onset  . Heart disease Father 31    died to MI at 35yo  . Heart attack Father   . Heart disease Brother 40    died of MI at 26yo  . Heart attack Brother    History   Social History  . Marital  Status: Divorced    Spouse Name: N/A    Number of Children: N/A  . Years of Education: N/A   Social History Main Topics  . Smoking status: Current Every Day Smoker -- 1.0 packs/day for 50 years    Types: Cigarettes    Last Attempt to Quit: 07/12/2010  . Smokeless tobacco: Never Used  . Alcohol Use: No  . Drug Use: No  . Sexually Active: No   Other Topics Concern  . None   Social History Narrative  . None   Review of Systems: General: no fevers, chills, changes in weight, changes in appetite Skin: no rash HEENT: no blurry vision, hearing changes, sore throat Pulm: no new dyspnea, coughing, wheezing CV: no chest pain, palpitations Abd: no nausea/vomiting, diarrhea/constipation GU: no dysuria, hematuria, polyuria Neuro: no weakness   Objective:  Physical Exam: Filed Vitals:   11/25/11 0950  BP: 116/70  Pulse: 90  TempSrc: Oral  Height: _0  (1.575 m)  Weight: 185 lb 3.2 oz (84.006 kg)  SpO2: 95%   Constitutional: Vital signs reviewed.  Patient is a well-developed and well-nourished woman in no acute distress and cooperative with exam. Head: Normocephalic and atraumatic Ear: TM normal bilaterally Mouth: no erythema or exudates, MMM Eyes: PERRL, EOMI, conjunctivae normal, No scleral icterus.  Cardiovascular: RRR, S1 normal, S2 normal, pulses symmetric and intact bilaterally, +murmur Pulmonary/Chest: CTAB, no wheezes, rales, or rhonchi, O2 by [place] Abdominal: Soft. Non-tender, non-distended, bowel sounds are normal, no masses, organomegaly, or guarding present.  MSK: Tender to palpation of left paraspinal muscles with palpable contractures Neurological: A&O x3 Skin: 25m oval warty stuck on appearing lesion on left abdomen Psychiatric: Normal mood and affect. speech and behavior is normal. Judgment and thought content normal. Cognition and memory  are normal.   Assessment & Plan:   Case and care discussed with Dr. Cathren Laine. Please see problem oriented charting for further  details.

## 2011-11-25 NOTE — Assessment & Plan Note (Signed)
Lesion most likely d/t seborrheic dermatitis (stuck on warty appearance).  Monitor. If lesions grows, changes shape or color, then biopsy.

## 2011-11-25 NOTE — Telephone Encounter (Signed)
Has contour meter and Contour next ez meter. Cannot get strips [paid by Medicare.  Per [facility] her insurance: Medicare B  needs PA and CMN , this is unusual as Medicare does not have a meter preference and the amount being requested is not abnormal. Pharmacy gave me  [facility] Medicare number to call: [phone]. When called they said her prescription did not have all the requirements for Medicare-( dx code, instruction with testing frequency, insulin or non- insulin treated, 2nd dx code for higher than normal frequency testing) the last correspondence they had from [place] there was a correction on the dx code that was not signed and dated. They received what they needed this am for our office. Patient can now get her testing supplies.

## 2011-11-25 NOTE — Patient Instructions (Signed)
-  Try taking cyclobenzaprine 5 mg every 8 hours as needed for pain - if this helps, it may help [place] clarify if the pain is due to a muscle spasm.  You are tender and tense to that area on physical exam today.  Please be sure to bring all of your medications with you to every visit.  Should you have any new or worsening symptoms, please be sure to call the clinic at [phone].

## 2011-12-09 ENCOUNTER — Ambulatory Visit (HOSPITAL_COMMUNITY)
Admission: RE | Admit: 2011-12-09 | Discharge: 2011-12-09 | Disposition: A | Discharge status: Home / Self Care | Payer: Medicare Other | Source: Ambulatory Visit | Attending: Internal Medicine | Admitting: Internal Medicine

## 2011-12-09 DIAGNOSIS — Z780 Asymptomatic menopausal state: Secondary | ICD-10-CM | POA: Insufficient documentation

## 2011-12-09 DIAGNOSIS — Z13820 Encounter for screening for osteoporosis: Secondary | ICD-10-CM | POA: Insufficient documentation

## 2011-12-11 ENCOUNTER — Other Ambulatory Visit: Payer: Self-pay | Admitting: *Deleted

## 2011-12-11 DIAGNOSIS — I10 Essential (primary) hypertension: Secondary | ICD-10-CM

## 2011-12-11 DIAGNOSIS — E785 Hyperlipidemia, unspecified: Secondary | ICD-10-CM

## 2011-12-11 MED ORDER — LISINOPRIL 10 MG PO TABS: 10.0000 mg | ORAL_TABLET | Freq: Every day | ORAL | Status: DC

## 2011-12-11 MED ORDER — ROSUVASTATIN CALCIUM 20 MG PO TABS: 20.0000 mg | ORAL_TABLET | Freq: Every day | ORAL | Status: DC

## 2011-12-12 ENCOUNTER — Emergency Department (HOSPITAL_COMMUNITY): Payer: Medicare Other

## 2011-12-12 ENCOUNTER — Emergency Department (HOSPITAL_COMMUNITY)
Admission: EM | Admit: 2011-12-12 | Discharge: 2011-12-12 | Disposition: A | Discharge status: Home / Self Care | Payer: Medicare Other | Attending: Emergency Medicine | Admitting: Emergency Medicine

## 2011-12-12 DIAGNOSIS — W06XXXA Fall from bed, initial encounter: Secondary | ICD-10-CM | POA: Insufficient documentation

## 2011-12-12 DIAGNOSIS — Z7982 Long term (current) use of aspirin: Secondary | ICD-10-CM | POA: Insufficient documentation

## 2011-12-12 DIAGNOSIS — IMO0001 Reserved for inherently not codable concepts without codable children: Secondary | ICD-10-CM | POA: Insufficient documentation

## 2011-12-12 DIAGNOSIS — D649 Anemia, unspecified: Secondary | ICD-10-CM | POA: Insufficient documentation

## 2011-12-12 DIAGNOSIS — J449 Chronic obstructive pulmonary disease, unspecified: Secondary | ICD-10-CM | POA: Insufficient documentation

## 2011-12-12 DIAGNOSIS — B029 Zoster without complications: Secondary | ICD-10-CM | POA: Insufficient documentation

## 2011-12-12 DIAGNOSIS — S0093XA Contusion of unspecified part of head, initial encounter: Secondary | ICD-10-CM

## 2011-12-12 DIAGNOSIS — S0003XA Contusion of scalp, initial encounter: Secondary | ICD-10-CM | POA: Insufficient documentation

## 2011-12-12 DIAGNOSIS — I5030 Unspecified diastolic (congestive) heart failure: Secondary | ICD-10-CM | POA: Insufficient documentation

## 2011-12-12 DIAGNOSIS — Y92009 Unspecified place in unspecified non-institutional (private) residence as the place of occurrence of the external cause: Secondary | ICD-10-CM | POA: Insufficient documentation

## 2011-12-12 DIAGNOSIS — Y9389 Activity, other specified: Secondary | ICD-10-CM | POA: Insufficient documentation

## 2011-12-12 DIAGNOSIS — S0083XA Contusion of other part of head, initial encounter: Secondary | ICD-10-CM | POA: Insufficient documentation

## 2011-12-12 DIAGNOSIS — W19XXXA Unspecified fall, initial encounter: Secondary | ICD-10-CM

## 2011-12-12 DIAGNOSIS — E785 Hyperlipidemia, unspecified: Secondary | ICD-10-CM | POA: Insufficient documentation

## 2011-12-12 DIAGNOSIS — S39012A Strain of muscle, fascia and tendon of lower back, initial encounter: Secondary | ICD-10-CM

## 2011-12-12 DIAGNOSIS — J4489 Other specified chronic obstructive pulmonary disease: Secondary | ICD-10-CM | POA: Insufficient documentation

## 2011-12-12 DIAGNOSIS — I270 Primary pulmonary hypertension: Secondary | ICD-10-CM | POA: Insufficient documentation

## 2011-12-12 DIAGNOSIS — I10 Essential (primary) hypertension: Secondary | ICD-10-CM | POA: Insufficient documentation

## 2011-12-12 DIAGNOSIS — F341 Dysthymic disorder: Secondary | ICD-10-CM | POA: Insufficient documentation

## 2011-12-12 DIAGNOSIS — M549 Dorsalgia, unspecified: Secondary | ICD-10-CM | POA: Insufficient documentation

## 2011-12-12 DIAGNOSIS — S1093XA Contusion of unspecified part of neck, initial encounter: Secondary | ICD-10-CM | POA: Insufficient documentation

## 2011-12-12 DIAGNOSIS — F17200 Nicotine dependence, unspecified, uncomplicated: Secondary | ICD-10-CM | POA: Insufficient documentation

## 2011-12-12 DIAGNOSIS — Z794 Long term (current) use of insulin: Secondary | ICD-10-CM | POA: Insufficient documentation

## 2011-12-12 DIAGNOSIS — K219 Gastro-esophageal reflux disease without esophagitis: Secondary | ICD-10-CM | POA: Insufficient documentation

## 2011-12-12 MED ORDER — HYDROCODONE-ACETAMINOPHEN 5-325 MG PO TABS
2.0000 | ORAL_TABLET | Freq: Once | ORAL | Status: AC
  Administered 2011-12-12: 2 via ORAL
  Filled 2011-12-12: qty 2

## 2011-12-12 NOTE — ED Notes (Addendum)
Per EMS: pt coming from home. Pt was asleep and fell out of bed, hitting head on oxygen machine. Pt c/o of dizziness and right knee pain and back pain. Pt placed in KED due to her kyphosis. Pt is A&Ox4, VSS. Pt has hematoma to top of head. Pt takes a baby aspirin a day

## 2011-12-12 NOTE — Discharge Instructions (Signed)
Back Pain, Adult Low back pain is very common. About 1 in 5 people have back pain.The cause of low back pain is rarely dangerous. The pain often gets better over time.About half of people with a sudden onset of back pain feel better in just 2 weeks. About 8 in 10 people feel better by 6 weeks.  CAUSES Some common causes of back pain include:  Strain of the muscles or ligaments supporting the spine.  Wear and tear (degeneration) of the spinal discs.  Arthritis.  Direct injury to the back. DIAGNOSIS Most of the time, the direct cause of low back pain is not known.However, back pain can be treated effectively even when the exact cause of the pain is unknown.Answering your caregiver's questions about your overall health and symptoms is one of the most accurate ways to make sure the cause of your pain is not dangerous. If your caregiver needs more information, he or she may order lab work or imaging tests (X-rays or MRIs).However, even if imaging tests show changes in your back, this usually does not require surgery. HOME CARE INSTRUCTIONS For many people, back pain returns.Since low back pain is rarely dangerous, it is often a condition that people can learn to manageon their own.   Remain active. It is stressful on the back to sit or stand in one place. Do not sit, drive, or stand in one place for more than 30 minutes at a time. Take short walks on level surfaces as soon as pain allows.Try to increase the length of time you walk each day.  Do not stay in bed.Resting more than 1 or 2 days can delay your recovery.  Do not avoid exercise or work.Your body is made to move.It is not dangerous to be active, even though your back may hurt.Your back will likely heal faster if you return to being active before your pain is gone.  Pay attention to your body when you bend and lift. Many people have less discomfortwhen lifting if they bend their knees, keep the load close to their bodies,and  avoid twisting. Often, the most comfortable positions are those that put less stress on your recovering back.  Find a comfortable position to sleep. Use a firm mattress and lie on your side with your knees slightly bent. If you lie on your back, put a pillow under your knees.  Only take over-the-counter or prescription medicines as directed by your caregiver. Over-the-counter medicines to reduce pain and inflammation are often the most helpful.Your caregiver may prescribe muscle relaxant drugs.These medicines help dull your pain so you can more quickly return to your normal activities and healthy exercise.  Put ice on the injured area.  Put ice in a plastic bag.  Place a towel between your skin and the bag.  Leave the ice on for 15 to 20 minutes, 3 to 4 times a day for the first 2 to 3 days. After that, ice and heat may be alternated to reduce pain and spasms.  Ask your caregiver about trying back exercises and gentle massage. This may be of some benefit.  Avoid feeling anxious or stressed.Stress increases muscle tension and can worsen back pain.It is important to recognize when you are anxious or stressed and learn ways to manage it.Exercise is a great option. SEEK MEDICAL CARE IF:  You have pain that is not relieved with rest or medicine.  You have pain that does not improve in 1 week.  You have new symptoms.  You are generally   not feeling well. SEEK IMMEDIATE MEDICAL CARE IF:   You have pain that radiates from your back into your legs.  You develop new bowel or bladder control problems.  You have unusual weakness or numbness in your arms or legs.  You develop nausea or vomiting.  You develop abdominal pain.  You feel faint. Document Released: 01/27/2005 Document Revised: 07/29/2011 Document Reviewed: 06/17/2010 [employer] Patient Information 2013 [employer].  

## 2011-12-12 NOTE — ED Provider Notes (Signed)
History     CSN: [id number]  Arrival date & time 12/12/11  6222   First MD Initiated Contact with Patient 12/12/11 [phone]      Chief Complaint  Patient presents with  . Fall     HPI Per EMS: pt coming from home. Pt was asleep and fell out of bed, hitting head on oxygen machine. Pt c/o of dizziness and right knee pain and back pain. Pt placed in KED due to her kyphosis. Pt is A&Ox4, VSS. Pt has hematoma to top of head  Past Medical History  Diagnosis Date  . Depression   . GERD (gastroesophageal reflux disease)     Followed by [name] // small bowel capsule endoscopy (11/2009) - minimal chronic inflammation, otherwise negative for  H.pylori, metaplasia, dysplasia  . Hypertension   . Hyperlipidemia   . Diabetes mellitus type II, uncontrolled     on insulin therapy  . COPD (chronic obstructive pulmonary disease) 2008    started home O2 (12/2010) // Prior significant smoker  // her CT of the chest there was no evidence of primary carcinoid tumor , scattered normal-sized mediastinal and hilar as well as axillary lymph nodes noted., no significant lymphadenopathy. COPD and emphysema with no acute cardiopulmonary disease noted , mild cardiomegaly and solitary calcified  plaque  . Moderate to severe pulmonary hypertension     severe per TEE (08/2010) - Peak RV-RA gradient 28m Hg  . Mitral regurgitation     mild to moderate per TEE (08/2010)  . Shingles   . Anemia     Anemia panel (08/2009) - iron 21, TIBC 441, UIBC 420,  ferritin 11 // Pt has been unable to tolerate iron supplementation due to chronic abdominal discomfort, source of which still is not clear. // Repeat EGD (05/2011) negative, colonoscopy (05/2011) showing only int hemorrhoids, no specific cause of anemia  . History of colonic polyps     Colonoscopy (10/2005) - colon polyp with path nonadenomatous, no malignancy - by Dr. [name] // Colonoscopy (02/2002) - 2 polyps noted, 1 - adenomatous, 2 - hyperplastic // Followed by Dr.  [name]// Repeat Colonoscopy (05/2011) - single tubular adenoma polyp - performed by Dr. [name] . Anxiety   . IBS (irritable bowel syndrome)   . Osteoporosis     DEXA (12/2004) - L-spine T score -2.6, Left hip -0.1  . Fibromyalgia   . Obstructive sleep apnea     on CPAP // Sleep study (06/2009) - Moderate sleep apnea/ hypopnea syndrome , AHI 17.8 per hour with nonpositional hypopneas. CPAP titration to 12 CWP, AHI 2.4 per hour. Small resMed Quattro full-face mask with heated humidifier  . Restless leg syndrome   . Mitral stenosis     moderate per TEE (08/2010) // Mitral valve replacement with a 27-mm pericardial porcine valve (Medtronic Mosaic valve, serial ##[id number] on 09/20/2010 - PIvin Poot// [name] . Bilateral carotid artery stenosis     s/p right endarterectomy (06/2010). // Carotid [place](07/2010) -  60%-79% ICA stenosis, mid range of scale by velocity.  Left: Moderate-to-severe calcific and non-calcific plaque origin andproximal ICA and ECA.  60%-79% ICA stenosis, highest end of scale // Followed by [name]  . Peripheral vascular disease   . Internal hemorrhoids     small internal hemorrhoids noted on colonoscopy 05/2011  . Clear cell renal cell carcinoma 07/21/2011    S/P cryoablation of left RCC in 09/2011 by Dr. YKathlene Cote// Historically -  MRI Abdomen (07/2011) - 2.7 x 2.7 x 3.0 cm enhancing solid renal mass posterior aspect of the lower pole of the left kidney. // CT Abdomen in 09/2010 showed no evidence of mass // Followed by [name]  [facility])   . Irregular heart beat   . Diastolic CHF     last echo (12/2010) showing grade 2 diastolic dysfunction. Otherwise, normal EF of 55-65%    Past Surgical History  Procedure Date  . Orif clavicle fracture 01/2004    by Thana Farr. [name], M.D for Right clavicle nonunion.  . Tonsillectomy   . Carotid endarterectomy 07/04/2010    right, by [name] for asymptomatic right  carotid artery stenosis  . Lipoma exision 08/2005    occipital lipoma 1.5cm - by Dr. [name]  . Hysteroscopy with endometrial ablation 06/2001    for persistent post-menopausal bleeding // by S. [name], M.D.  . Mitral valve replacement 09/20/10     with a 27-mm pericardial porcine valve (Medtronic Mosaic valve, serial #[id number]). 09/20/10, Dr Prescott Gum  . Left atrial maze procedure 09/20/10    for paroxysmal atrial fibrillation (Dr. Prescott Gum)  . Placement of left chest tube. 09/24/2010    Dr Prescott Gum  . Pr vein bypass graft,aorto-fem-pop May 2012  . Cardiac valve replacement Aug. 2012  . Colonoscopy 05/12/2011    performed by Dr. Michail Sermon. Showing small internal hemorrhoids, single tubular adenoma polyp  . Esophagogastroduodenoscopy 05/12/2011    performed by Dr. Michail Sermon. Negative for ulcerations, biopsy negative for evidence of celiac sprue  . Tubal ligation     Family History  Problem Relation Age of Onset  . Heart disease Father 68    died to MI at 9yo  . Heart attack Father   . Heart disease Brother 80    died of MI at 88yo  . Heart attack Brother     History  Substance Use Topics  . Smoking status: Current Every Day Smoker -- 1.0 packs/day for 50 years    Types: Cigarettes    Last Attempt to Quit: 07/12/2010  . Smokeless tobacco: Never Used  . Alcohol Use: No    OB History    Grav Para Term Preterm Abortions TAB SAB Ect Mult Living                  Review of Systems All other systems reviewed and are negative Allergies  Lorazepam; Morphine; Percocet; and Tramadol hcl  Home Medications   Current Outpatient Rx  Name Route Sig Dispense Refill  . ACCU-CHEK FASTCLIX LANCETS MISC Does not apply 1 each by Does not apply route 3 (three) times daily before meals. Check blood sugars 3 times daily before breakfast, lunch, and dinner DX code: 250.02 102 each 12  . ALBUTEROL SULFATE HFA 108 (90 BASE) MCG/ACT IN AERS Inhalation Inhale 2 puffs into the lungs every 6  (six) hours as needed. For wheeze or shortness of breath    . ALPRAZOLAM 1 MG PO TABS Oral Take 1 tablet (1 mg total) by mouth 3 (three) times daily as needed for sleep or anxiety. 90 tablet 5  . ASPIRIN 81 MG PO TABS Oral Take 81 mg by mouth daily with supper.     . BUPROPION HCL ER (XL) 150 MG PO TB24 Oral Take 150 mg by mouth daily with supper.    Marland Kitchen DOCUSATE SODIUM 50 MG PO CAPS Oral Take by mouth every other day. Alternate with  senokot    . DULOXETINE  HCL 30 MG PO CPEP  daily with breakfast. Take 3 tablets by mouth once daily    . TRUFORM STOCKINGS 20-30MMHG MISC  Wear compression stockings throughout the day, and take off at nighttime. 2 each 1  . FERROUS GLUCONATE 324 (38 FE) MG PO TABS Oral Take 1 tablet (324 mg total) by mouth 2 (two) times daily with a meal. 120 tablet 3  . FLUTICASONE-SALMETEROL 250-50 MCG/DOSE IN AEPB Inhalation Inhale 1 puff into the lungs every 12 (twelve) hours. 1 each 11  . FUROSEMIDE 80 MG PO TABS  TAKE 1 TABLET BY MOUTH TWICE DAILY 60 tablet 12  . GABAPENTIN 300 MG PO CAPS Oral Take 1 capsule (300 mg total) by mouth 2 (two) times daily. 60 capsule 5  . GLUCOSE BLOOD VI STRP  Use to test blood sugar 3 times daily.Dx code 250.00 100 each 12  . HYDROCODONE-ACETAMINOPHEN 5-325 MG PO TABS  1-2 tablets every 6 hours as needed for pain. Do not take with alcohol, do no drive or operate heavy machinery if drowsiness occurs. Do not take more than instructed. 240 tablet 1  . INSULIN ASPART 100 UNIT/ML [place] SOLN Subcutaneous Inject 9-19 Units into the skin 3 (three) times daily before meals. 7 unit base and sliding scale    . INSULIN DETEMIR 100 UNIT/ML [place] SOLN Subcutaneous Inject 56 Units into the skin at bedtime. DISPENSE 2 VIALS/ MONTH 20 mL 5  . INSULIN PEN NEEDLE 31G X 5 MM MISC Does not apply by Does not apply route 3 (three) times daily.    . INSULIN SYRINGE 31G X 5/16" 0.3 ML MISC Does not apply 1 Syringe by Does not apply route 2 (two) times daily at 10 AM and 5 PM. 100  each 11  . LISINOPRIL 10 MG PO TABS Oral Take 1 tablet (10 mg total) by mouth daily. 30 tablet 3  . LORATADINE 10 MG PO TABS Oral Take 10 mg by mouth daily with supper.    . OMEPRAZOLE 40 MG PO CPDR Oral Take 20 mg by mouth 2 (two) times daily.     Marland Kitchen POTASSIUM CHLORIDE CRYS ER 20 MEQ PO TBCR  TAKE 1 TABLET BY MOUTH THREE TIMES A DAY 90 tablet 3  . ROSUVASTATIN CALCIUM 20 MG PO TABS Oral Take 1 tablet (20 mg total) by mouth daily with supper. 30 tablet 5  . SENNA-DOCUSATE SODIUM 8.6-50 MG PO TABS Oral Take 2 tablets by mouth every other day. Hold for loose stools    . TIOTROPIUM BROMIDE MONOHYDRATE 18 MCG IN CAPS Inhalation Place 1 capsule (18 mcg total) into inhaler and inhale daily with breakfast. 30 capsule 11    BP 125/51  Pulse 82  Temp 98.9 F (37.2 C) (Oral)  Resp 16  Ht _0  (1.575 m)  Wt 185 lb (83.915 kg)  BMI 33.84 kg/m2  SpO2 100%  Physical Exam  Nursing note and vitals reviewed. Constitutional: She is oriented to person, place, and time. She appears well-developed and well-nourished. No distress.  HENT:  Head: Normocephalic and atraumatic.    Eyes: Pupils are equal, round, and reactive to light.  Neck: Normal range of motion.  Cardiovascular: Normal rate and intact distal pulses.   Pulmonary/Chest: No respiratory distress.  Abdominal: Normal appearance. She exhibits no distension.  Musculoskeletal:       Lumbar back: She exhibits tenderness and pain.       Back:  Neurological: She is alert and oriented to person, place, and time. No cranial  nerve deficit.  Skin: Skin is warm and dry. No rash noted.  Psychiatric: She has a normal mood and affect. Her behavior is normal.    ED Course  Procedures (including critical care time)  Labs Reviewed - No data to display Dg Lumbar Spine Complete  12/12/2011  *RADIOLOGY REPORT*  Clinical Data: 61 year old female status post fall with low back pain.  LUMBAR SPINE - COMPLETE 4+ VIEW  Comparison: Lumbar radiographs  07/28/2007.  Findings: Normal lumbar segmentation.  Stable and normal lumbar vertebral height alignment.  Stable and relatively preserved disc spaces. Incidental spina bifida occulta at S1.  There is a degree of chronic osteopenia.  No pars fracture.  Chronic moderate L5-S1 facet hypertrophy appears increased.  Grossly intact sacral ala and SI joints.  Visualized lower thoracic levels appear stable.  IMPRESSION: No acute fracture or listhesis identified in the lumbar spine. Chronic L5-S1 facet hypertrophy.   Original Report Authenticated By: Roselyn Reef, M.D.    Ct Head Wo Contrast  12/12/2011  *RADIOLOGY REPORT*  Clinical Data: 61 year old female status post fall, dizziness, pain, hematoma  CT HEAD WITHOUT CONTRAST  Technique:  Contiguous axial images were obtained from the base of the skull through the vertex without contrast.  Comparison: 06/02/2011 and earlier.  Findings: Visualized paranasal sinuses and mastoids are clear. Visualized orbit soft tissues are within normal limits.  No focal scalp hematoma identified. No acute osseous abnormality identified.  Stable and normal cerebral volume.  No ventriculomegaly. No midline shift, mass effect, or evidence of mass lesion.  No evidence of cortically based acute infarction identified.  No acute intracranial hemorrhage identified.  Scattered dural calcifications. No suspicious intracranial vascular hyperdensity.  IMPRESSION: Normal noncontrast appearance of the brain.  No acute traumatic injury identified.   Original Report Authenticated By: Roselyn Reef, M.D.      1. Fall   2. Head contusion   3. Back strain       MDM          Dot Lanes, MD 12/12/11 [phone]

## 2011-12-17 ENCOUNTER — Telehealth: Payer: Self-pay | Admitting: Dietician

## 2011-12-17 NOTE — Telephone Encounter (Signed)
Thank so much for looking into this for me. I've filled out this out like 4-5 times, and would love not to fill it out repeatedly.  Thank you! - [name], D.O., 12/17/2011, 5:47 PM

## 2011-12-17 NOTE — Telephone Encounter (Signed)
Spoke to [employer] processing center per physician request about duplicate faxes about testing supplies for this patient. They informed me that they  have the information they needed at this point and said that the most recent fax should be good for a "lifetime" because it is within Medicare testing recommendations.

## 2011-12-18 ENCOUNTER — Other Ambulatory Visit: Payer: Self-pay | Admitting: *Deleted

## 2011-12-18 DIAGNOSIS — M797 Fibromyalgia: Secondary | ICD-10-CM

## 2011-12-19 MED ORDER — BUPROPION HCL ER (XL) 150 MG PO TB24: 150.0000 mg | ORAL_TABLET | Freq: Every day | ORAL | Status: DC

## 2011-12-19 MED ORDER — ALBUTEROL SULFATE HFA 108 (90 BASE) MCG/ACT IN AERS: 2.0000 | INHALATION_SPRAY | Freq: Four times a day (QID) | RESPIRATORY_TRACT | Status: DC | PRN

## 2011-12-19 MED ORDER — FLUTICASONE-SALMETEROL 250-50 MCG/DOSE IN AEPB: 1.0000 | INHALATION_SPRAY | Freq: Two times a day (BID) | RESPIRATORY_TRACT | Status: DC

## 2011-12-19 MED ORDER — HYDROCODONE-ACETAMINOPHEN 5-325 MG PO TABS: ORAL_TABLET | ORAL | Status: DC

## 2011-12-19 NOTE — Telephone Encounter (Signed)
Please call in her narcotic. I spoke with Labella earlier in the week, and she is aware that I am going to be cutting back on the amount of the medication.   Thank you! - [name], D.O., 12/19/2011, 7:16 AM

## 2011-12-26 NOTE — Telephone Encounter (Signed)
Called to pharm 

## 2012-01-01 ENCOUNTER — Telehealth: Payer: Self-pay | Admitting: Dietician

## 2012-01-01 NOTE — Telephone Encounter (Signed)
Has not heard from anyone about pulmonary rehab. And  Wants log book mailed- have mailed logbook will ask about pulmonary rehab referral as I see it in Dr. Doy Mince note.

## 2012-01-05 IMAGING — US US ABDOMEN COMPLETE
1 series · 14 of 25 positions shown · non-contrast
Comparison: CT chest 06/25/2006

CLINICAL DATA: Right upper quadrant pain.  Evaluate for stones.

ABDOMINAL ULTRASOUND COMPLETE

[Series 1: us abdomen complete · 0.33mm/px · 14 of 88 slices shown]
[im 1/88]
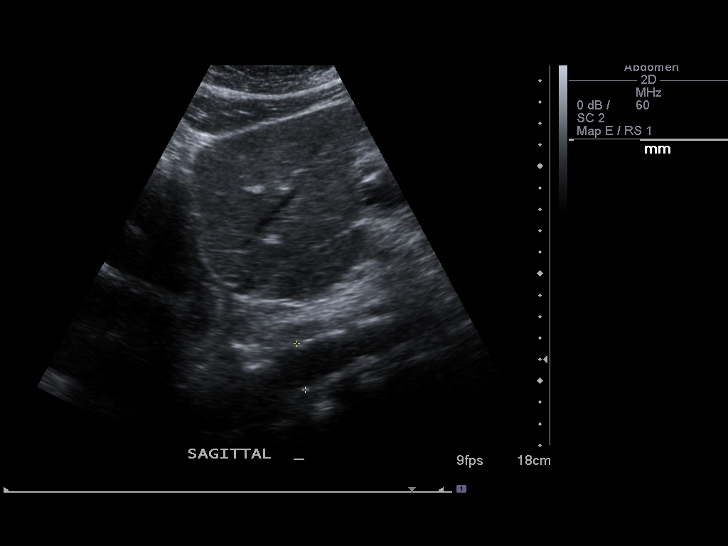
[im 8/88]
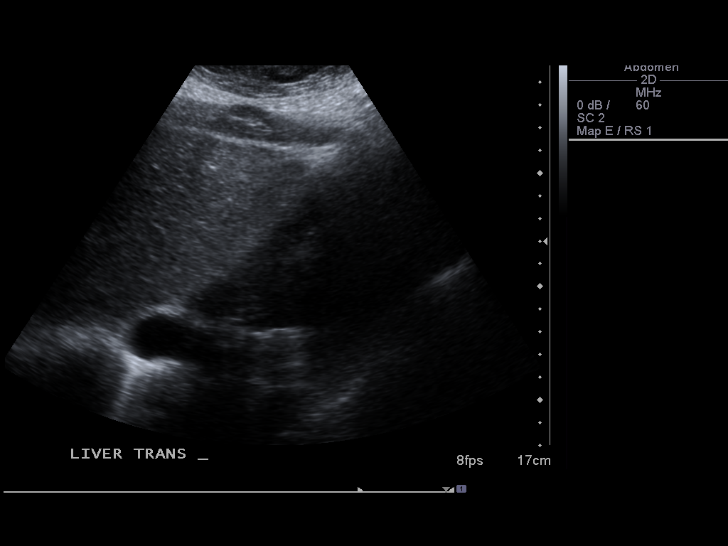
[im 15/88]
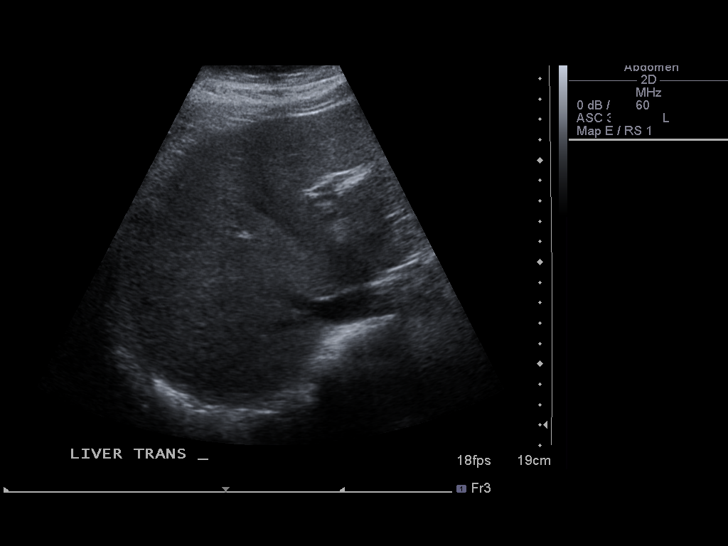
[im 22/88]
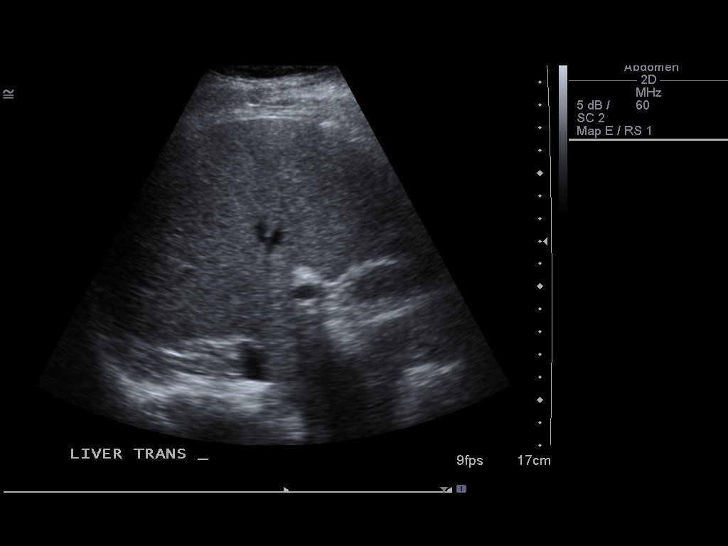
[im 30/88]
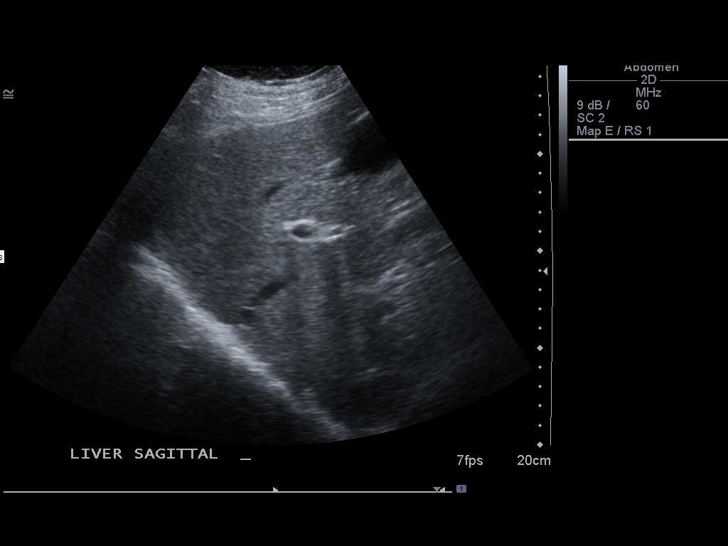
[im 33/88]
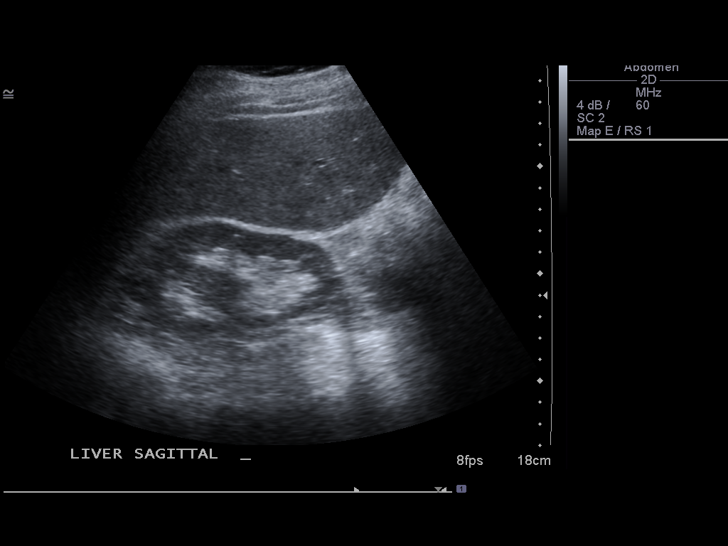
[im 40/88]
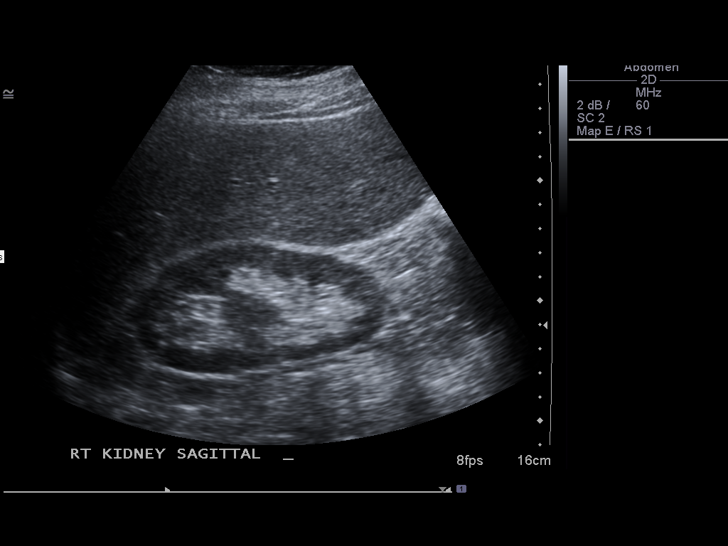
[im 48/88]
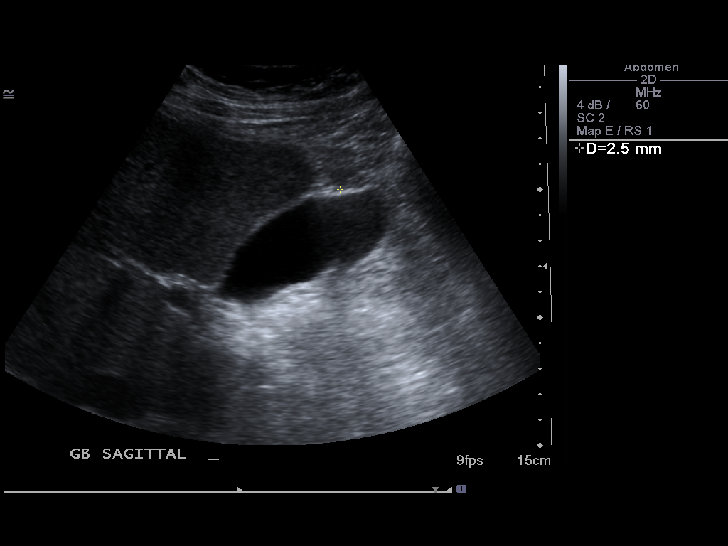
[im 55/88]
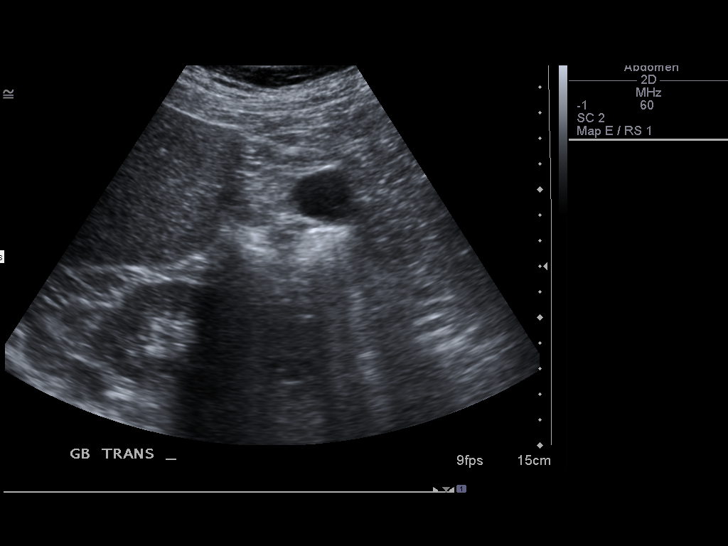
[im 59/88]
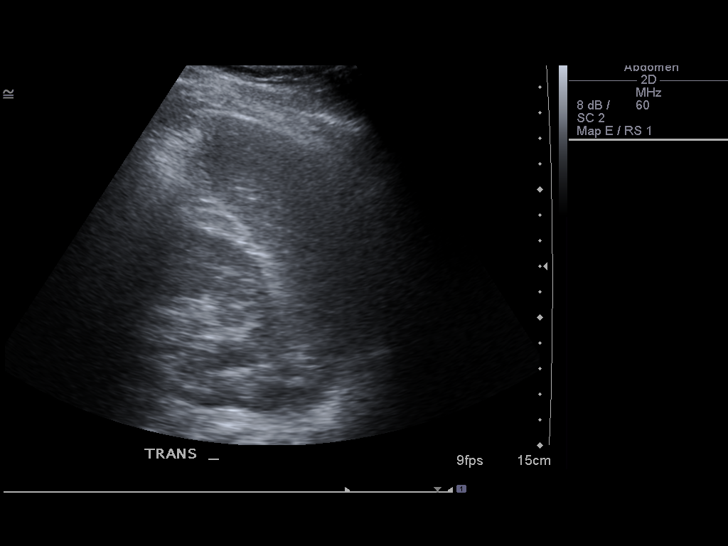
[im 66/88]
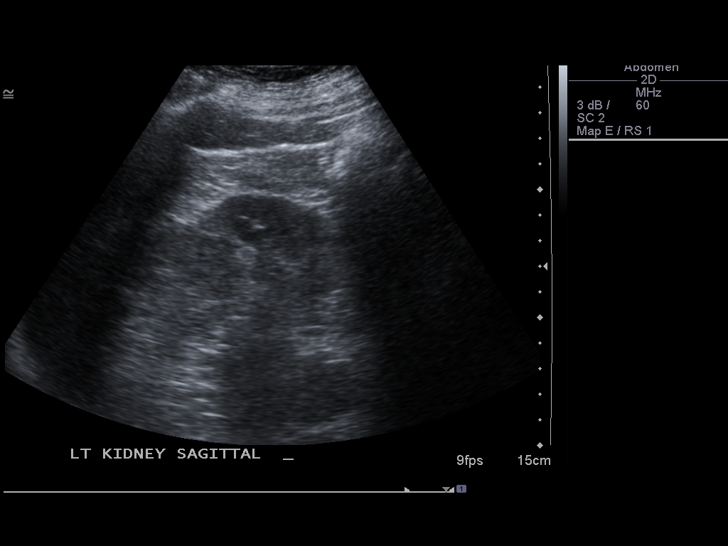
[im 73/88]
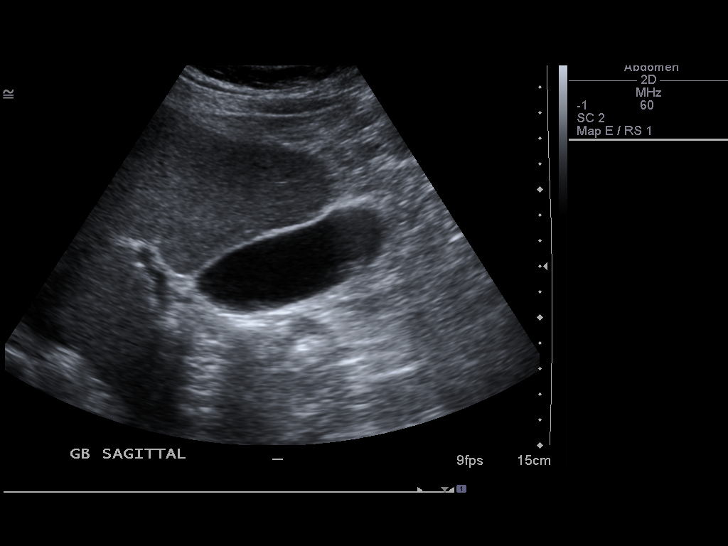
[im 80/88]
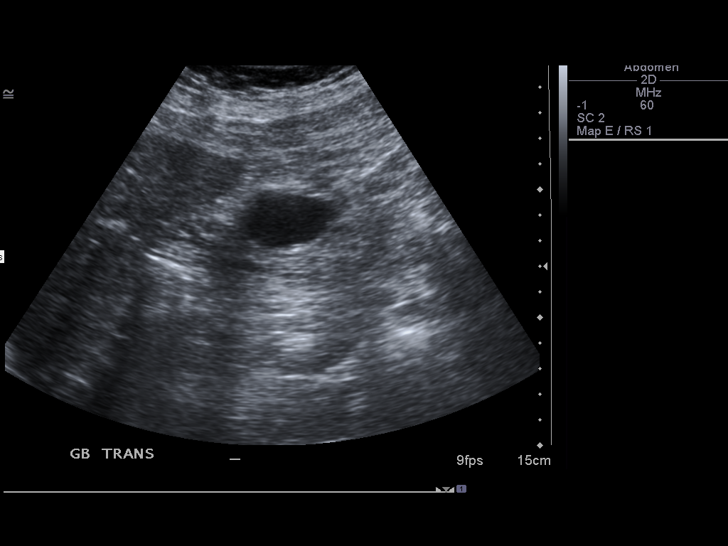
[im 88/88]
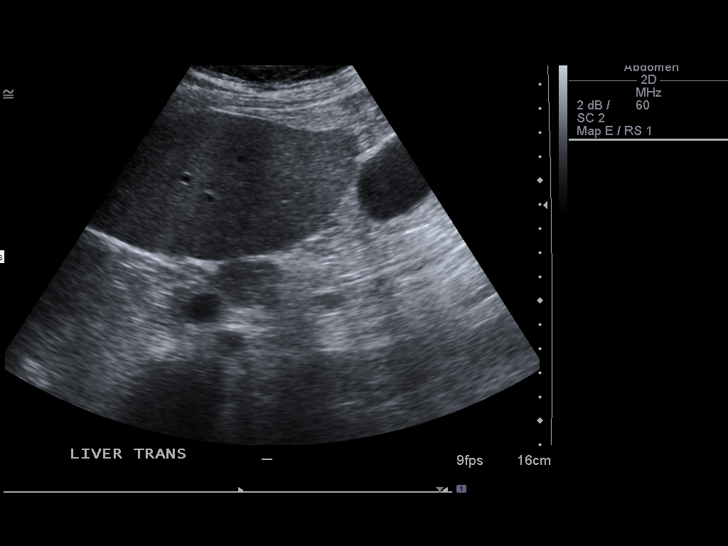

[14 of 25 positions shown; findings below may reference images not displayed]

FINDINGS: Gallbladder:  No gallstones, gallbladder wall thickening, or
pericholecystic fluid.

Common Bile Duct:  Within normal limits in caliber. Measures
mm.

Liver: No focal mass lesion identified.  Within normal limits in
parenchymal echogenicity.

IVC:  Appears normal.

Pancreas:  No abnormality identified, although entire pancreas
cannot be visualized by ultrasound.

Spleen:  Within normal limits in size and echotexture.

Right kidney:  Normal in size and parenchymal echogenicity.  No
evidence of mass or hydronephrosis.

Left kidney:  Normal in size and parenchymal echogenicity.  No
evidence of mass or hydronephrosis.

Abdominal Aorta:  No aneurysm identified.

No ascites is identified.
IMPRESSION: Negative abdominal ultrasound.

## 2012-01-06 NOTE — Telephone Encounter (Signed)
Per Lela, patient needs to call Pulmonary Rehab. Lela to call patient and inform her of this.

## 2012-01-07 ENCOUNTER — Telehealth (HOSPITAL_COMMUNITY): Payer: Self-pay | Admitting: *Deleted

## 2012-01-07 NOTE — Telephone Encounter (Signed)
Call placed to Ms. Chay regarding Pulmonary Rehab referral.  We have requested update on PFTs per Medicare requirements for COPD.  Message left for [facility] in [facility] regarding this request.   We will schedule once PFT are done.   Leverne Humbles RN

## 2012-01-12 ENCOUNTER — Other Ambulatory Visit: Payer: Self-pay | Admitting: Internal Medicine

## 2012-01-12 ENCOUNTER — Other Ambulatory Visit: Payer: Self-pay | Admitting: Cardiology

## 2012-01-13 ENCOUNTER — Other Ambulatory Visit: Payer: Self-pay | Admitting: *Deleted

## 2012-01-13 DIAGNOSIS — M797 Fibromyalgia: Secondary | ICD-10-CM

## 2012-01-13 MED ORDER — INSULIN PEN NEEDLE 31G X 5 MM MISC: Status: DC

## 2012-01-13 MED ORDER — HYDROCODONE-ACETAMINOPHEN 5-325 MG PO TABS
ORAL_TABLET | ORAL | Status: DC
Start: 2012-01-13 — End: 2012-03-05

## 2012-01-13 NOTE — Telephone Encounter (Signed)
Please phone in her narcs. To be filled after 12/8  Thank you! - [name], D.O., 01/13/2012, 4:50 PM

## 2012-01-14 IMAGING — CR DG CHEST 2V
2 series · 2 of 2 positions shown · non-contrast
Comparison: March 26, 2009

CLINICAL DATA: Anemia, shortness of breath

CHEST - 2 VIEW

[w chest pa]
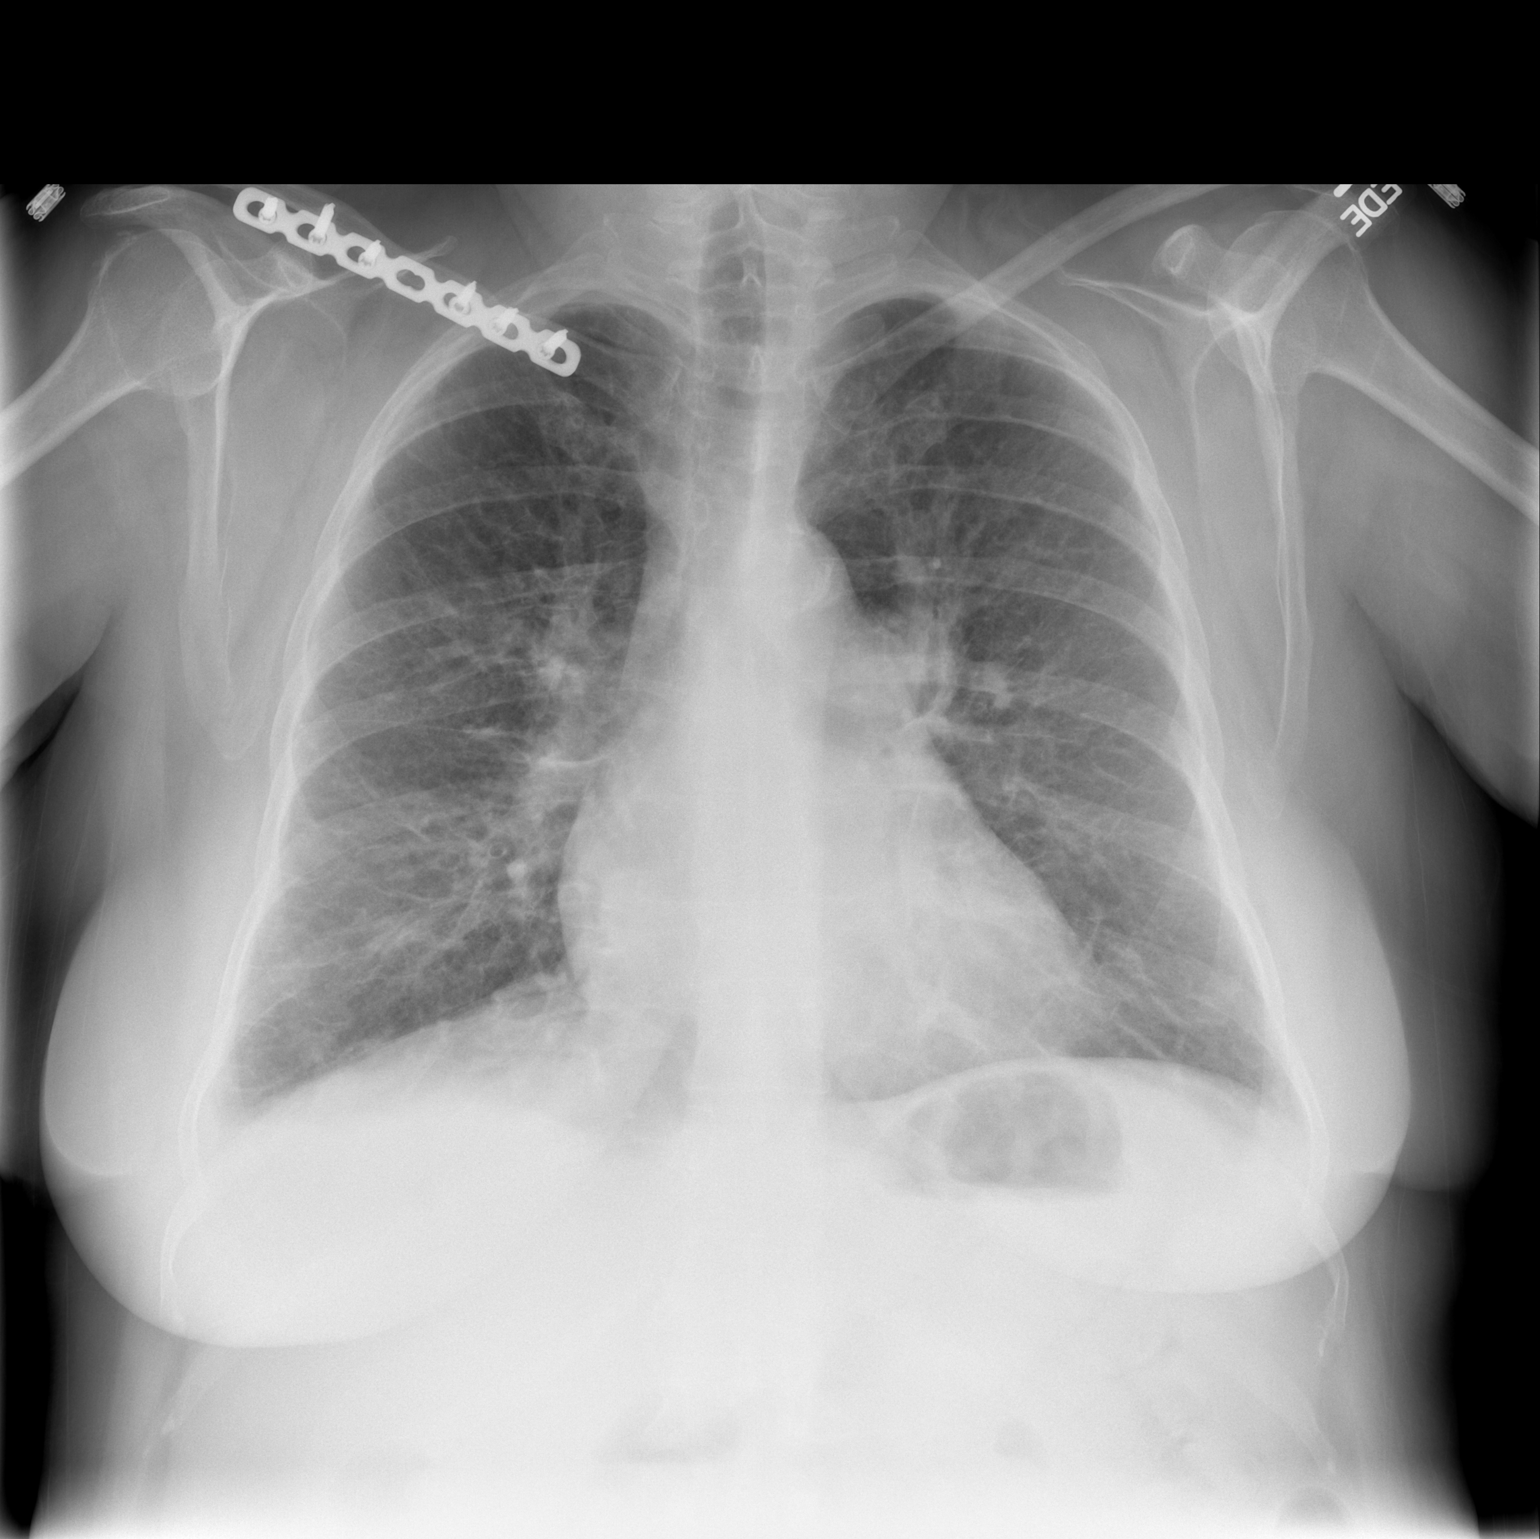

[w chest lat]
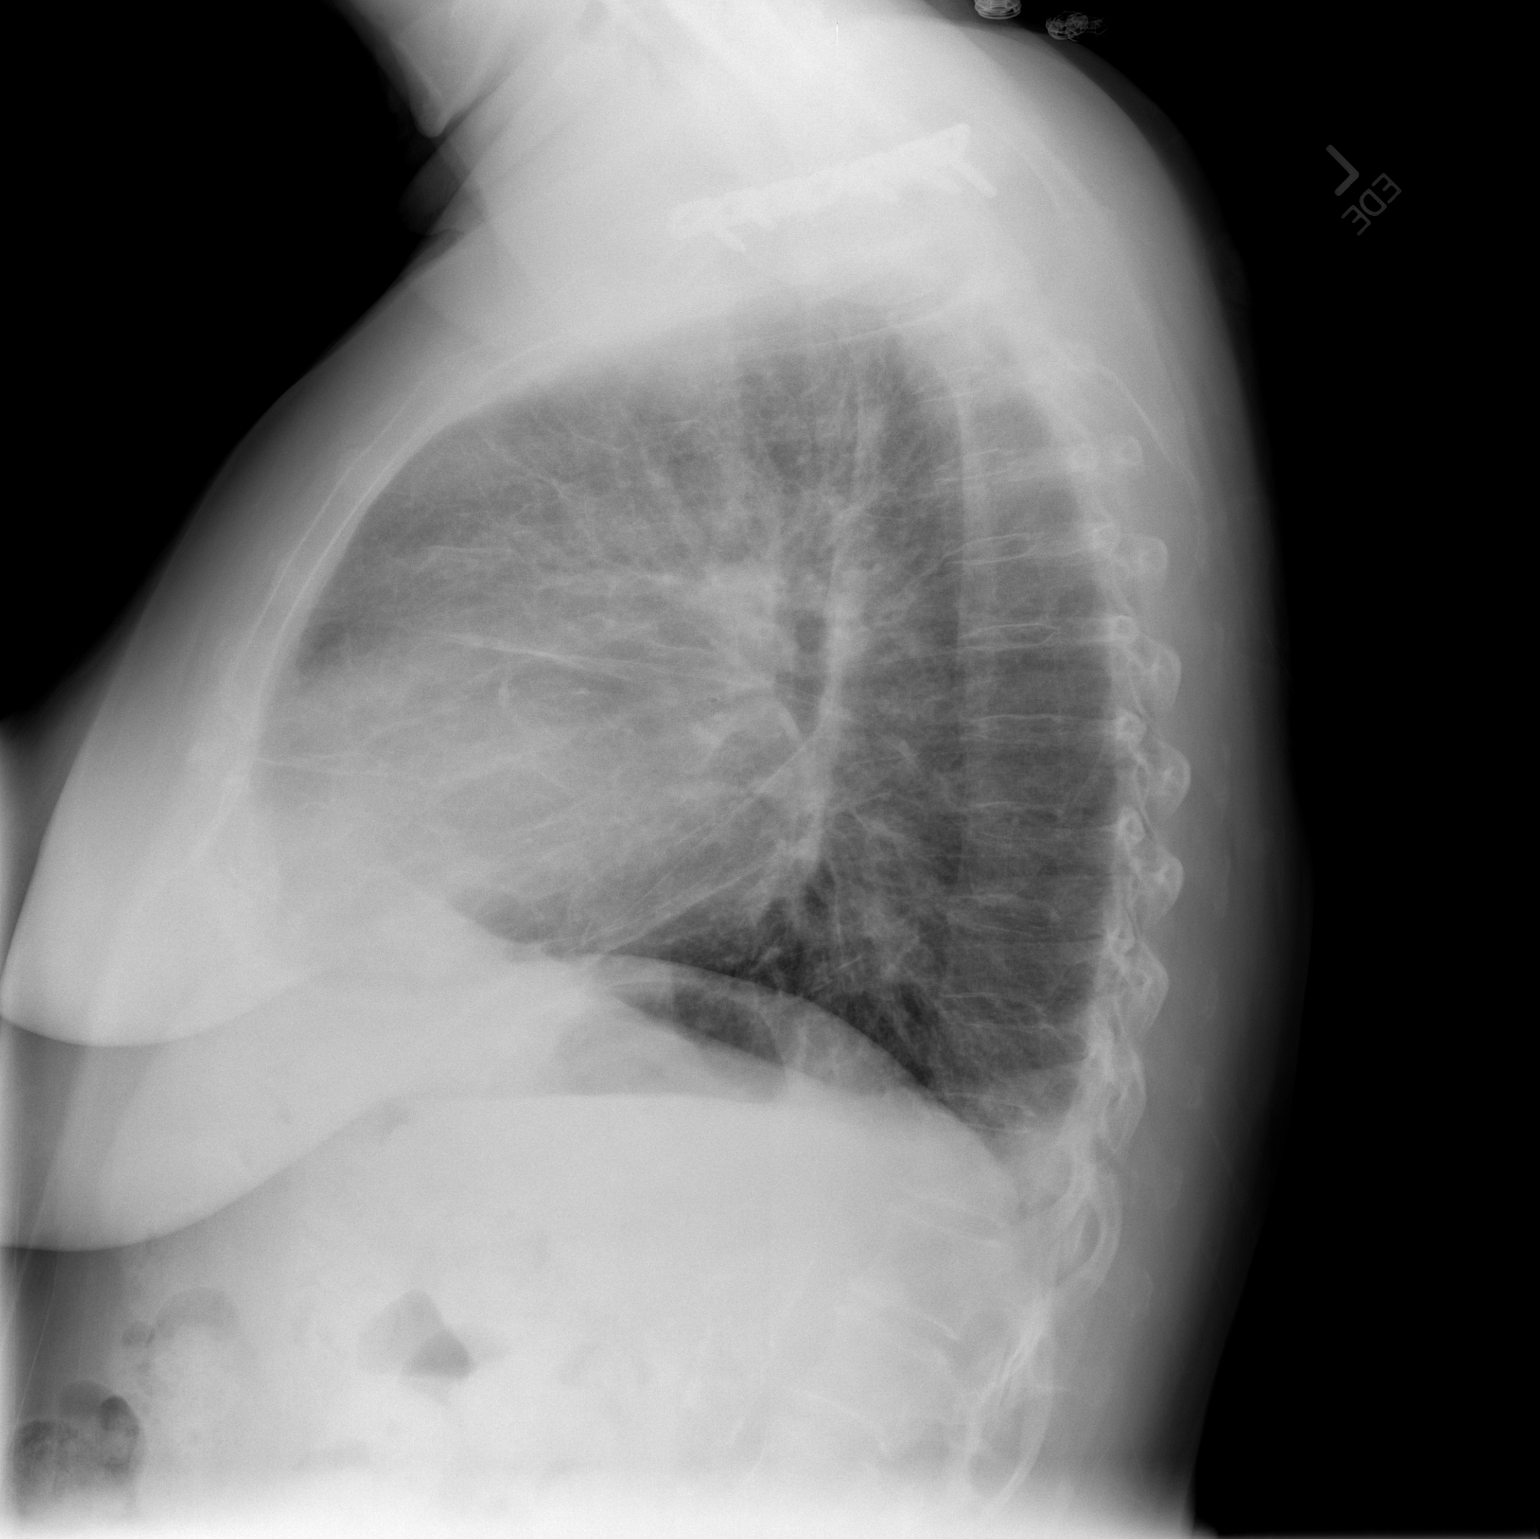

[2 of 2 positions shown; findings below may reference images not displayed]

FINDINGS: The cardiac silhouette, mediastinum, pulmonary
vasculature are within normal limits.  There is mild, diffuse
prominence of the interstitial markings bilaterally, consistent
with an element of chronic fibrotic disease.  There is a new, small
pleural effusion on the right.  No focal infiltrates are
identified.
IMPRESSION: Small right pleural effusion.

Chronic interstitial fibrotic change.

## 2012-01-14 NOTE — Telephone Encounter (Signed)
Rx called in 

## 2012-01-21 ENCOUNTER — Other Ambulatory Visit: Payer: Self-pay | Admitting: Internal Medicine

## 2012-01-21 DIAGNOSIS — J449 Chronic obstructive pulmonary disease, unspecified: Secondary | ICD-10-CM

## 2012-01-26 ENCOUNTER — Encounter: Payer: Self-pay | Admitting: Oncology

## 2012-01-26 ENCOUNTER — Telehealth: Payer: Self-pay | Admitting: *Deleted

## 2012-01-26 ENCOUNTER — Ambulatory Visit (HOSPITAL_BASED_OUTPATIENT_CLINIC_OR_DEPARTMENT_OTHER): Payer: Medicare Other | Admitting: Oncology

## 2012-01-26 ENCOUNTER — Other Ambulatory Visit: Payer: Medicare Other | Admitting: Lab

## 2012-01-26 VITALS — BP 104/61 | HR 97 | Temp 98.6°F | Resp 20 | Ht 62.0 in | Wt 184.9 lb

## 2012-01-26 DIAGNOSIS — N289 Disorder of kidney and ureter, unspecified: Secondary | ICD-10-CM

## 2012-01-26 DIAGNOSIS — D649 Anemia, unspecified: Secondary | ICD-10-CM

## 2012-01-26 DIAGNOSIS — D638 Anemia in other chronic diseases classified elsewhere: Secondary | ICD-10-CM

## 2012-01-26 DIAGNOSIS — C649 Malignant neoplasm of unspecified kidney, except renal pelvis: Secondary | ICD-10-CM

## 2012-01-26 LAB — CBC WITH DIFFERENTIAL/PLATELET
BASO%: 0.5 % (ref 0.0–2.0)
Basophils Absolute: 0 10*3/uL (ref 0.0–0.1)
EOS%: 1.9 % (ref 0.0–7.0)
Eosinophils Absolute: 0.2 10*3/uL (ref 0.0–0.5)
HCT: 36.5 % (ref 34.8–46.6)
HGB: 12.4 g/dL (ref 11.6–15.9)
LYMPH%: 20.4 % (ref 14.0–49.7)
MCH: 32.2 pg (ref 25.1–34.0)
MCHC: 34.1 g/dL (ref 31.5–36.0)
MCV: 94.5 fL (ref 79.5–101.0)
MONO#: 0.9 10*3/uL (ref 0.1–0.9)
MONO%: 9.9 % (ref 0.0–14.0)
NEUT#: 6 10*3/uL (ref 1.5–6.5)
NEUT%: 67.3 % (ref 38.4–76.8)
Platelets: 199 10*3/uL (ref 145–400)
RBC: 3.86 10*6/uL (ref 3.70–5.45)
RDW: 16 % — ABNORMAL HIGH (ref 11.2–14.5)
WBC: 9 10*3/uL (ref 3.9–10.3)
lymph#: 1.8 10*3/uL (ref 0.9–3.3)

## 2012-01-26 NOTE — Progress Notes (Signed)
OFFICE PROGRESS NOTE  CC  [name], DO [address] [place] [address]  DIAGNOSIS: 61 year old female with chronic anemia unclear etiology  PRIOR THERAPY:   #1 anemia of chronic disease.  #2 clear cell renal cell carcinoma status post RFA by interventional radiology.  CURRENT THERAPY: Observation  INTERVAL HISTORY: [name] 61 y.o. female returns for Visit today . She has had RFA for the left renal cell carcinoma. Overall she is doing well with it without any problems. Today she feels well she has no nausea vomiting fevers chills night sweats headaches shortness of breath chest pains or palpitations no myalgias or arthralgias. We did not check a CBC on her and we will do so. Remainder of the 10 point review of systems is negative.  MEDICAL HISTORY: Past Medical History  Diagnosis Date  . Depression   . GERD (gastroesophageal reflux disease)     Followed by [name] // small bowel capsule endoscopy (11/2009) - minimal chronic inflammation, otherwise negative for  H.pylori, metaplasia, dysplasia  . Hypertension   . Hyperlipidemia   . Diabetes mellitus type II, uncontrolled     on insulin therapy  . COPD (chronic obstructive pulmonary disease) 2008    started home O2 (12/2010) // Prior significant smoker  // her CT of the chest there was no evidence of primary carcinoid tumor , scattered normal-sized mediastinal and hilar as well as axillary lymph nodes noted., no significant lymphadenopathy. COPD and emphysema with no acute cardiopulmonary disease noted , mild cardiomegaly and solitary calcified  plaque  . Moderate to severe pulmonary hypertension     severe per TEE (08/2010) - Peak RV-RA gradient 76m Hg  . Mitral regurgitation     mild to moderate per TEE (08/2010)  . Shingles   . Anemia     Anemia panel (08/2009) - iron 21, TIBC 441, UIBC 420,  ferritin 11 // Pt has been unable to tolerate iron supplementation due to chronic abdominal  discomfort, source of which still is not clear. // Repeat EGD (05/2011) negative, colonoscopy (05/2011) showing only int hemorrhoids, no specific cause of anemia  . History of colonic polyps     Colonoscopy (10/2005) - colon polyp with path nonadenomatous, no malignancy - by Dr. [name] // Colonoscopy (02/2002) - 2 polyps noted, 1 - adenomatous, 2 - hyperplastic // Followed by Dr. [name]// Repeat Colonoscopy (05/2011) - single tubular adenoma polyp - performed by Dr. [name] . Anxiety   . IBS (irritable bowel syndrome)   . Osteoporosis     DEXA (12/2004) - L-spine T score -2.6, Left hip -0.1  . Fibromyalgia   . Obstructive sleep apnea     on CPAP // Sleep study (06/2009) - Moderate sleep apnea/ hypopnea syndrome , AHI 17.8 per hour with nonpositional hypopneas. CPAP titration to 12 CWP, AHI 2.4 per hour. Small resMed Quattro full-face mask with heated humidifier  . Restless leg syndrome   . Mitral stenosis     moderate per TEE (08/2010) // Mitral valve replacement with a 27-mm pericardial porcine valve (Medtronic Mosaic valve, serial ##[id number] on 09/20/2010 - PIvin Poot// [name] . Bilateral carotid artery stenosis     s/p right endarterectomy (06/2010). // Carotid [place](07/2010) -  60%-79% ICA stenosis, mid range of scale by velocity.  Left: Moderate-to-severe calcific and non-calcific plaque origin andproximal ICA and ECA.  60%-79% ICA stenosis, highest end of scale // Followed by [name]  .  Peripheral vascular disease   . Internal hemorrhoids     small internal hemorrhoids noted on colonoscopy 05/2011  . Clear cell renal cell carcinoma 07/21/2011    S/P cryoablation of left RCC in 09/2011 by [name] // Historically - MRI Abdomen (07/2011) - 2.7 x 2.7 x 3.0 cm enhancing solid renal mass posterior aspect of the lower pole of the left kidney. // CT Abdomen in 09/2010 showed no evidence of mass // Followed by [name]  [facility])    . Irregular heart beat   . Diastolic CHF     last echo (12/2010) showing grade 2 diastolic dysfunction. Otherwise, normal EF of 55-65%    ALLERGIES:  is allergic to lorazepam; morphine; percocet; and tramadol hcl.  MEDICATIONS:  Current Outpatient Prescriptions  Medication Sig Dispense Refill  . ACCU-CHEK FASTCLIX LANCETS MISC 1 each by Does not apply route 3 (three) times daily before meals. Check blood sugars 3 times daily before breakfast, lunch, and dinner DX code: 250.02  102 each  12  . albuterol (PROVENTIL HFA;VENTOLIN HFA) 108 (90 BASE) MCG/ACT inhaler Inhale 2 puffs into the lungs every 6 (six) hours as needed. For wheeze or shortness of breath  3 Inhaler  3  . ALPRAZolam (XANAX) 1 MG tablet Take 1 tablet (1 mg total) by mouth 3 (three) times daily as needed for sleep or anxiety.  90 tablet  5  . aspirin 81 MG tablet Take 81 mg by mouth daily with supper.       Marland Kitchen buPROPion (WELLBUTRIN XL) 150 MG 24 hr tablet Take 1 tablet (150 mg total) by mouth daily with supper.  90 tablet  3  . docusate sodium (COLACE) 50 MG capsule Take by mouth every other day. Alternate with  senokot      . DULoxetine (CYMBALTA) 30 MG capsule daily with breakfast. Take 3 tablets by mouth once daily      . Elastic Bandages & Supports (TRUFORM STOCKINGS 20-30MMHG) MISC Wear compression stockings throughout the day, and take off at nighttime.  2 each  1  . ferrous gluconate (FERGON) 324 MG tablet Take 1 tablet (324 mg total) by mouth 2 (two) times daily with a meal.  120 tablet  3  . Fluticasone-Salmeterol (ADVAIR DISKUS) 250-50 MCG/DOSE AEPB Inhale 1 puff into the lungs every 12 (twelve) hours.  3 each  3  . furosemide (LASIX) 80 MG tablet TAKE 1 TABLET BY MOUTH TWICE DAILY  60 tablet  12  . gabapentin (NEURONTIN) 300 MG capsule TAKE 1 CAPSULE BY MOUTH TWICE DAILY  60 capsule  PRN  . glucose blood (BAYER CONTOUR TEST) test strip Use to test blood sugar 3 times daily.Dx code 250.00  100 each  12  .  HYDROcodone-acetaminophen (NORCO) 5-325 MG per tablet 1-2 tablets every 6 hours as needed for pain. Do not take with alcohol, do no drive or operate heavy machinery if drowsiness occurs. Do not take more than instructed.  180 tablet  0  . insulin aspart (NOVOLOG) 100 UNIT/ML injection Inject 9-19 Units into the skin 3 (three) times daily before meals. 7 unit base and sliding scale      . insulin detemir (LEVEMIR) 100 UNIT/ML injection Inject 56 Units into the skin at bedtime. DISPENSE 2 VIALS/ MONTH  20 mL  5  . insulin detemir (LEVEMIR) 100 UNIT/ML injection Inject 56 Units into the skin at bedtime.  2 vial  2  . Insulin Pen Needle (B-D UF III MINI PEN NEEDLES) 31G  X 5 MM MISC DX: 250.02 Please check blood sugars 3 times daily.  100 each  11  . Insulin Syringe-Needle U-100 (INSULIN SYRINGE .3CC/31GX5/16") 31G X 5/16" 0.3 ML MISC 1 Syringe by Does not apply route 2 (two) times daily at 10 AM and 5 PM.  100 each  11  . lisinopril (PRINIVIL,ZESTRIL) 10 MG tablet Take 1 tablet (10 mg total) by mouth daily.  30 tablet  3  . loratadine (CLARITIN) 10 MG tablet Take 10 mg by mouth daily with supper.      Marland Kitchen omeprazole (PRILOSEC) 40 MG capsule Take 20 mg by mouth 2 (two) times daily.       . potassium chloride SA (K-DUR,KLOR-CON) 20 MEQ tablet TAKE 1 TABLET BY MOUTH THREE TIMES A DAY  90 tablet  PRN  . rosuvastatin (CRESTOR) 20 MG tablet Take 1 tablet (20 mg total) by mouth daily with supper.  30 tablet  5  . sennosides-docusate sodium (SENOKOT-S) 8.6-50 MG tablet Take 2 tablets by mouth every other day. Hold for loose stools      . tiotropium (SPIRIVA) 18 MCG inhalation capsule Place 1 capsule (18 mcg total) into inhaler and inhale daily with breakfast.  30 capsule  11    SURGICAL HISTORY:  Past Surgical History  Procedure Date  . Orif clavicle fracture 01/2004    by Thana Farr. [name], M.D for Right clavicle nonunion.  . Tonsillectomy   . Carotid endarterectomy 07/04/2010    right, by [name] for  asymptomatic right carotid artery stenosis  . Lipoma exision 08/2005    occipital lipoma 1.5cm - by Dr. [name]  . Hysteroscopy with endometrial ablation 06/2001    for persistent post-menopausal bleeding // by S. [name], M.D.  . Mitral valve replacement 09/20/10     with a 27-mm pericardial porcine valve (Medtronic Mosaic valve, serial #[id number]). 09/20/10, Dr Prescott Gum  . Left atrial maze procedure 09/20/10    for paroxysmal atrial fibrillation (Dr. Prescott Gum)  . Placement of left chest tube. 09/24/2010    Dr Prescott Gum  . Pr vein bypass graft,aorto-fem-pop May 2012  . Cardiac valve replacement Aug. 2012  . Colonoscopy 05/12/2011    performed by Dr. Michail Sermon. Showing small internal hemorrhoids, single tubular adenoma polyp  . Esophagogastroduodenoscopy 05/12/2011    performed by Dr. Michail Sermon. Negative for ulcerations, biopsy negative for evidence of celiac sprue  . Tubal ligation     REVIEW OF SYSTEMS:  Pertinent items are noted in HPI.   PHYSICAL EXAMINATION: not performed  ECOG PERFORMANCE STATUS: 2 - Symptomatic, <50% confined to bed  Blood pressure 104/61, pulse 97, temperature 98.6 F (37 C), temperature source Oral, resp. rate 20, height _0  (1.575 m), weight 184 lb 14.4 oz (83.87 kg).  LABORATORY DATA: Lab Results  Component Value Date   WBC 7.9 11/03/2011   HGB 12.3 11/03/2011   HCT 38.0 11/03/2011   MCV 92.0 11/03/2011   PLT 237 11/03/2011      Chemistry      Component Value Date/Time   NA 137 11/25/2011 1054   K 4.6 11/25/2011 1054   CL 98 11/25/2011 1054   CO2 27 11/25/2011 1054   BUN 18 11/25/2011 1054   CREATININE 0.94 11/25/2011 1054   CREATININE 1.2 09/30/2011 1130      Component Value Date/Time   CALCIUM 9.4 11/25/2011 1054   ALKPHOS 75 06/16/2011 1430   AST 21 06/16/2011 1430   ALT 18 06/16/2011 1430   BILITOT 0.3  06/16/2011 1430       RADIOGRAPHIC STUDIES: MRI ABDOMEN WITH AND WITHOUT CONTRAST  Technique: Multiplanar multisequence MR imaging of the  abdomen was  performed both before and after administration of intravenous  contrast.  Contrast: 75m MULTIHANCE GADOBENATE DIMEGLUMINE 529 MG/ML IV SOLN  Comparison: Abdominal ultrasound 07/01/2011.  Findings: In the posterior aspect of the lower pole of the left  kidney there is a 2.7 x 2.7 x 3.0 cm lesion that it is  heterogeneous in signal intensity on pre-gadolinium T1 and T2-  weighted sequences, but demonstrates avid heterogeneous enhancement  on post-gadolinium images, consistent with a solid renal neoplasm.  This lesion also demonstrates heterogeneous high signal intensity  on diffusion weighted imaging (and low signal intensity on ADC  map). This appears relatively well defined and appears to have an  external capsule. At this time, there is no gross abnormality of  the adjacent pararenal fat to suggest direct invasion.  Additionally, there is no associated regional adenopathy. There  are several other tiny subcentimeter lesions in the kidneys  bilaterally which are high signal intensity on T2-weighted images,  low signal intensity on pre Gadolinium T1-weighted images, and do  not enhance, compatible with simple cysts.  On out of phase dual echo imaging there is slight decrease in  signal intensity throughout the hepatic parenchyma, compatible with  mild hepatic steatosis. There are no focal hepatic lesions  identified. The appearance of the gallbladder, pancreas,  visualized spleen and right adrenal gland is unremarkable. There  is slight thickening of the left adrenal gland without a definite  dominant nodule, however, the left adrenal gland also loses signal  intensity on out-of-phase dual echo imaging, suggesting adenomatous  hyperplasia.  There appear to be single renal arteries bilaterally. Notably,  there is extensive atherosclerosis of the abdominal aorta, and this  appears to be relatively severe distally aortic caliber is narrowed  to 1.5 x 0.9 cm. The visualized  portions of the proximal common  iliac arteries appear very small caliber bilaterally.  IMPRESSION:  1. 2.7 x 2.7 x 3.0 cm enhancing solid renal neoplasm in the  posterior aspect of the lower pole of the left kidney. This  appears relatively well encapsulated at this time, and there is no  definite associated regional adenopathy. This is highly suspicious  for a renal cell carcinoma and the findings suggest T1a, N0, Mx  disease (i.e., likely stage I disease).  2. Hepatic steatosis.  3. Adenomatous hypertrophy of the left adrenal gland.  4. Atherosclerosis, as above.    ASSESSMENT:61year old female who originally saw me for chronic anemia of unclear etiology. On her workup for anemia she was found to have a elevated erythropoietin level. Because of this we did do an ultrasound of the abdomen that showed a possibility of a renal mass on the left kidney. She then went on to get an MRI of the abdomen performed that now confirms the mass in the lower pole of the left kidney it measures 3.0 cm. patient was seen by Dr. SAnnie MainDahlStead who referred her to Dr. GMonica Martinez got up. Patient since then has gone on to have reduced radiofrequency ablation of the renal mass. She overall tolerated it well.   PLAN: #1 I will check a CBC today and we will call the patient regarding her results.  #2 the meantime she will continue to follow with Dr. GDimas Millin All questions were answered. The patient knows to call the clinic with any problems, questions or concerns.  We can certainly see the patient much sooner if necessary.  I spent 20 minutes counseling the patient face to face. The total time spent in the appointment was 30 minutes.    Marcy Panning, MD Medical/Oncology [facility] [phone] (beeper) [phone] (Office)  01/26/2012, 9:19 AM

## 2012-01-26 NOTE — Telephone Encounter (Signed)
Gave patient appointment for [phone]

## 2012-02-02 ENCOUNTER — Ambulatory Visit (INDEPENDENT_AMBULATORY_CARE_PROVIDER_SITE_OTHER): Payer: Medicare Other | Admitting: Internal Medicine

## 2012-02-02 ENCOUNTER — Encounter: Payer: Self-pay | Admitting: Internal Medicine

## 2012-02-02 VITALS — BP 110/65 | HR 97 | Temp 98.1°F | Wt 184.9 lb

## 2012-02-02 DIAGNOSIS — J4489 Other specified chronic obstructive pulmonary disease: Secondary | ICD-10-CM

## 2012-02-02 DIAGNOSIS — E118 Type 2 diabetes mellitus with unspecified complications: Secondary | ICD-10-CM

## 2012-02-02 DIAGNOSIS — IMO0002 Reserved for concepts with insufficient information to code with codable children: Secondary | ICD-10-CM

## 2012-02-02 DIAGNOSIS — J449 Chronic obstructive pulmonary disease, unspecified: Secondary | ICD-10-CM

## 2012-02-02 DIAGNOSIS — E1165 Type 2 diabetes mellitus with hyperglycemia: Secondary | ICD-10-CM

## 2012-02-02 DIAGNOSIS — I10 Essential (primary) hypertension: Secondary | ICD-10-CM

## 2012-02-02 DIAGNOSIS — M810 Age-related osteoporosis without current pathological fracture: Secondary | ICD-10-CM

## 2012-02-02 DIAGNOSIS — B372 Candidiasis of skin and nail: Secondary | ICD-10-CM

## 2012-02-02 LAB — POCT GLYCOSYLATED HEMOGLOBIN (HGB A1C): Hemoglobin A1C: 8

## 2012-02-02 LAB — GLUCOSE, CAPILLARY: Glucose-Capillary: 194 mg/dL — ABNORMAL HIGH (ref 70–99)

## 2012-02-02 MED ORDER — INSULIN DETEMIR 100 UNIT/ML ~~LOC~~ SOLN: 58.0000 [IU] | Freq: Every day | SUBCUTANEOUS | Status: DC

## 2012-02-02 MED ORDER — NYSTATIN 100000 UNIT/GM EX POWD: CUTANEOUS | Status: DC

## 2012-02-02 NOTE — Patient Instructions (Signed)
General Instructions:  Please follow-up at the clinic in 3 months, at which time we will reevaluate your diabetes, blood pressure - OR, please follow-up in the clinic sooner if needed.  See Butch Penny Plyler in between our visit.  There have been changes in your medications:  INCREASE your Levemir to 58 unit every evening - use a magnifying glass if at all possible.  If your blood sugars in the morning are still above 150 mg/dL --> increase your Levemir by 2 additional units to 60 units every evening.   Please get your eye exam performed.  If you are diabetic, please bring your meter to your next visit.  If symptoms worsen, or new symptoms arise, please call the clinic or go to the ER.  PLEASE BRING ALL OF YOUR MEDICATIONS  IN A BAG TO YOUR NEXT APPOINTMENT   Treatment Goals:  Goals (1 Years of Data) as of 02/02/2012          As of Today 01/26/12 12/12/11 12/12/11 11/25/11     Blood Pressure    . Blood Pressure < 140/90  110/65 104/61 103/65 125/51 116/70     Result Component    . HEMOGLOBIN A1C < 7.0  8.0        . LDL CALC < 70            Progress Toward Treatment Goals:  Treatment Goal 02/02/2012  Hemoglobin A1C improved  Blood pressure at goal  Stop smoking smoking less    Self Care Goals & Plans:    Home Blood Glucose Monitoring 02/02/2012  Check my blood sugar 3 times a day  When to check my blood sugar before breakfast; before lunch; before dinner     Care Management & Community Referrals:

## 2012-02-02 NOTE — Progress Notes (Signed)
Patient: [name]   MRN: [id number]  DOB: 07/24/1950  PCP: Malachi Pro, DO   Subjective:    HPI: [name] is a 61 y.o. female with a PMHx of O2-dependent COPD, DMII, HTN, mitral stenosis with recent porcine valve replacement, who presented to clinic today for the following:  1) DM2, uncontrolled -  Lab Results  Component Value Date   HGBA1C 8.0 02/02/2012   Patient checking blood sugars 2-3 times daily, before breakfast, lunch, and dinner. Reports fasting blood sugars of < 200 mg/dL - 170s mg/dL. Currently taking Levemir 56 or 58 units at bedtime, she states she cannot see the insulin syringe easily, therefore, is not always accurate with her dosing. As well, she is typically giving 17-20 units of Novolog TIDWC - she is deciding the insulin dosage just based off of what she is eating (she is not following the previously prescribed 9 units bolus + sliding scale insulin of 1:25 over 150 mg/dL Patient misses doses 0 x per week on average.  0 hypoglycemic episodes since last visit, last symptomatic hypoglycemia several years ago. admits to polyuria, polydipsia because is on lasix. Denies nausea, vomiting, diarrhea.   In regards to diabetic complications:  Microvascular complications: Confirms: peripheral neuropathy ; Denies nephropathy and retinopathy.  Macrovascular complications: Confirms: cerebrovascular disease and peripheral vascular disease ; Denies cardiovascular disease.   Important diabetic medications: Is patient on aspirin? Yes Is patient on statin? Yes Is patient on ARB? Yes  2) Falls - during our last visit together, we discussed the patients prior frequency falls that were thought to be multifactorial, contributed by moderate to severe anemia, chronic gait instability, and possible narcotic effect during her last hospitalization (05/2011). She has been self reducing her narcotic, and is off of her Flexeril (which she agreed was also causing increased  somnolence and sedation contributing towards falls). Denies recent falls, no lightheadedness, dizziness.  3) HTN - Patient does not check blood pressure regularly at home. Currently taking lasix 55m BID and lisinprol. Patient misses doses 0 x per week on average. denies headaches, chest pain, shortness of breath (changed from her baseline).  does not request refills today.   4) COPD on home oxygen - Patient has been on home oxygen since 12/2010 for multifactorial reasons - 2/2 severe pulmonary hypertension and COPD. She had recently started smoking again, however, is again attempting to stop smoking. Continuing to use her home oxygen throughout the day, she is benefiting from the oxygen therapy at home, and is using her portable oxygen system within her home.   Review of Systems: Per HPI.   Current Outpatient Medications: Medication Sig  . ACCU-CHEK FASTCLIX LANCETS MISC 1 each by Does not apply route 3 (three) times daily before meals. Check blood sugars 3 times daily before breakfast, lunch, and dinner DX code: 250.02  . albuterol (PROVENTIL HFA;VENTOLIN HFA) 108 (90 BASE) MCG/ACT inhaler Inhale 2 puffs into the lungs every 6 (six) hours as needed. For wheeze or shortness of breath  . ALPRAZolam (XANAX) 1 MG tablet Take 1 tablet (1 mg total) by mouth 3 (three) times daily as needed for sleep or anxiety.  .Marland Kitchenaspirin 81 MG tablet Take 81 mg by mouth daily with supper.   .Marland KitchenbuPROPion (WELLBUTRIN XL) 150 MG 24 hr tablet Take 1 tablet (150 mg total) by mouth daily with supper.  . docusate sodium (COLACE) 50 MG capsule Take by mouth every other day. Alternate with  senokot  . DULoxetine (CYMBALTA)  30 MG capsule daily with breakfast. Take 3 tablets by mouth once daily  . Elastic Bandages & Supports (TRUFORM STOCKINGS 20-30MMHG) MISC Wear compression stockings throughout the day, and take off at nighttime.  . ferrous gluconate (FERGON) 324 MG tablet Take 1 tablet (324 mg total) by mouth 2 (two) times  daily with a meal.  . Fluticasone-Salmeterol (ADVAIR DISKUS) 250-50 MCG/DOSE AEPB Inhale 1 puff into the lungs every 12 (twelve) hours.  . furosemide (LASIX) 80 MG tablet TAKE 1 TABLET BY MOUTH TWICE DAILY  . gabapentin (NEURONTIN) 300 MG capsule TAKE 1 CAPSULE BY MOUTH TWICE DAILY  . glucose blood (BAYER CONTOUR TEST) test strip Use to test blood sugar 3 times daily.Dx code 250.00  . HYDROcodone-acetaminophen (NORCO) 5-325 MG per tablet 1-2 tablets every 6 hours as needed for pain. Do not take with alcohol, do no drive or operate heavy machinery if drowsiness occurs. Do not take more than instructed.  . insulin aspart (NOVOLOG) 100 UNIT/ML injection Inject 9-19 Units into the skin 3 (three) times daily before meals. 7 unit base and sliding scale  . insulin detemir (LEVEMIR) 100 UNIT/ML injection Inject 56 Units into the skin at bedtime. DISPENSE 2 VIALS/ MONTH  . insulin detemir (LEVEMIR) 100 UNIT/ML injection Inject 56 Units into the skin at bedtime.  . Insulin Pen Needle (B-D UF III MINI PEN NEEDLES) 31G X 5 MM MISC DX: 250.02 Please check blood sugars 3 times daily.  . Insulin Syringe-Needle U-100 (INSULIN SYRINGE .3CC/31GX5/16") 31G X 5/16" 0.3 ML MISC 1 Syringe by Does not apply route 2 (two) times daily at 10 AM and 5 PM.  . lisinopril (PRINIVIL,ZESTRIL) 10 MG tablet Take 1 tablet (10 mg total) by mouth daily.  Marland Kitchen loratadine (CLARITIN) 10 MG tablet Take 10 mg by mouth daily with supper.  Marland Kitchen omeprazole (PRILOSEC) 40 MG capsule Take 20 mg by mouth 2 (two) times daily.   . potassium chloride SA (K-DUR,KLOR-CON) 20 MEQ tablet TAKE 1 TABLET BY MOUTH THREE TIMES A DAY  . rosuvastatin (CRESTOR) 20 MG tablet Take 1 tablet (20 mg total) by mouth daily with supper.  . sennosides-docusate sodium (SENOKOT-S) 8.6-50 MG tablet Take 2 tablets by mouth every other day. Hold for loose stools  . tiotropium (SPIRIVA) 18 MCG inhalation capsule Place 1 capsule (18 mcg total) into inhaler and inhale daily with  breakfast.     Allergies  Allergen Reactions  . Lorazepam     Patient's sister noted that ativan caused the patient to become extremely confused during hospitalization 09/2010  . Morphine     REACTION: Unknown reaction  . Percocet (Oxycodone-Acetaminophen)     headache  . Tramadol Hcl     REACTION: swelling    Past Medical History  Diagnosis Date  . Depression   . GERD (gastroesophageal reflux disease)     Followed by [name] // small bowel capsule endoscopy (11/2009) - minimal chronic inflammation, otherwise negative for  H.pylori, metaplasia, dysplasia  . Hypertension   . Hyperlipidemia   . Diabetes mellitus type II, uncontrolled     on insulin therapy  . COPD (chronic obstructive pulmonary disease) 2008    started home O2 (12/2010) // Prior significant smoker  // her CT of the chest there was no evidence of primary carcinoid tumor , scattered normal-sized mediastinal and hilar as well as axillary lymph nodes noted., no significant lymphadenopathy. COPD and emphysema with no acute cardiopulmonary disease noted , mild cardiomegaly and solitary calcified  plaque  . Moderate  to severe pulmonary hypertension     severe per TEE (08/2010) - Peak RV-RA gradient 82m Hg  . Mitral regurgitation     mild to moderate per TEE (08/2010)  . Shingles   . Anemia     Anemia panel (08/2009) - iron 21, TIBC 441, UIBC 420,  ferritin 11 // Pt has been unable to tolerate iron supplementation due to chronic abdominal discomfort, source of which still is not clear. // Repeat EGD (05/2011) negative, colonoscopy (05/2011) showing only int hemorrhoids, no specific cause of anemia  . History of colonic polyps     Colonoscopy (10/2005) - colon polyp with path nonadenomatous, no malignancy - by Dr. [name] // Colonoscopy (02/2002) - 2 polyps noted, 1 - adenomatous, 2 - hyperplastic // Followed by Dr. [name]// Repeat Colonoscopy (05/2011) - single tubular adenoma polyp - performed by Dr. [name] . Anxiety     . IBS (irritable bowel syndrome)   . Osteoporosis     DEXA (12/2004) - L-spine T score -2.6, Left hip -0.1  . Fibromyalgia   . Obstructive sleep apnea     on CPAP // Sleep study (06/2009) - Moderate sleep apnea/ hypopnea syndrome , AHI 17.8 per hour with nonpositional hypopneas. CPAP titration to 12 CWP, AHI 2.4 per hour. Small resMed Quattro full-face mask with heated humidifier  . Restless leg syndrome   . Mitral stenosis     moderate per TEE (08/2010) // Mitral valve replacement with a 27-mm pericardial porcine valve (Medtronic Mosaic valve, serial ##[id number] on 09/20/2010 - PIvin Poot// [name] . Bilateral carotid artery stenosis     s/p right endarterectomy (06/2010). // Carotid [place](07/2010) -  60%-79% ICA stenosis, mid range of scale by velocity.  Left: Moderate-to-severe calcific and non-calcific plaque origin andproximal ICA and ECA.  60%-79% ICA stenosis, highest end of scale // Followed by [name]  . Peripheral vascular disease   . Internal hemorrhoids     small internal hemorrhoids noted on colonoscopy 05/2011  . Clear cell renal cell carcinoma 07/21/2011    S/P cryoablation of left RCC in 09/2011 by Dr. YKathlene Cote// Historically - MRI Abdomen (07/2011) - 2.7 x 2.7 x 3.0 cm enhancing solid renal mass posterior aspect of the lower pole of the left kidney. // CT Abdomen in 09/2010 showed no evidence of mass // [facility]   . Irregular heart beat   . Diastolic CHF     last echo (12/2010) showing grade 2 diastolic dysfunction. Otherwise, normal EF of 55-65%    Past Surgical History  Procedure Date  . Orif clavicle fracture 01/2004    by MThana Farr YLorin Mercy M.D for Right clavicle nonunion.  . Tonsillectomy   . Carotid endarterectomy 07/04/2010    right, by Dr. BTrula Sladefor asymptomatic right carotid artery stenosis  . Lipoma exision 08/2005    occipital lipoma 1.5cm - by Dr. LRebekah Chesterfield . Hysteroscopy with  endometrial ablation 06/2001    for persistent post-menopausal bleeding // by S. DOlena Mater M.D.  . Mitral valve replacement 09/20/10     with a 27-mm pericardial porcine valve (Medtronic Mosaic valve, serial ##[id number]. 09/20/10, Dr VPrescott Gum . Left atrial maze procedure 09/20/10    for paroxysmal atrial fibrillation (Dr. VPrescott Gum  . Placement of left chest tube. 09/24/2010    Dr VPrescott Gum . Pr vein bypass graft,aorto-fem-pop May 2012  . Cardiac valve replacement Aug. 2012  .  Colonoscopy 05/12/2011    performed by Dr. Michail Sermon. Showing small internal hemorrhoids, single tubular adenoma polyp  . Esophagogastroduodenoscopy 05/12/2011    performed by Dr. Michail Sermon. Negative for ulcerations, biopsy negative for evidence of celiac sprue  . Tubal ligation      Objective:    Physical Exam: Filed Vitals:   02/02/12 1552  BP: 110/65  Pulse: 97  Temp: 98.1 F (36.7 C)      General: Vital signs reviewed and noted. Well-developed, well-nourished, in no acute distress; alert, appropriate and cooperative throughout examination.  Head: Normocephalic, atraumatic.  Nose: Mucous membranes moist, not inflammed, nonerythematous.  Throat: Oropharynx nonerythematous, no exudate appreciated.   Neck: No deformities, masses, or tenderness noted.  Lungs:  Normal respiratory effort. Clear to auscultation BL without crackles or wheezes.  Heart: RRR. S1 and S2 normal without gallop, rubs. (+) murmur.  Abdomen:  BS normoactive. Soft, Nondistended, non-tender.  No masses or organomegaly.  Extremities: No pretibial edema.    Assessment/ Plan:   Case and plan of care discussed with attending physician, Dr. Bertha Stakes.

## 2012-02-04 ENCOUNTER — Encounter: Payer: Self-pay | Admitting: Internal Medicine

## 2012-02-04 NOTE — Assessment & Plan Note (Signed)
Pertinent Data: BP Readings from Last 3 Encounters:  02/02/12 110/65  01/26/12 104/61  12/12/11 207/21    Basic Metabolic Panel:    Component Value Date/Time   NA 137 11/25/2011 1054   K 4.6 11/25/2011 1054   CL 98 11/25/2011 1054   CO2 27 11/25/2011 1054   BUN 18 11/25/2011 1054   CREATININE 0.94 11/25/2011 1054   CREATININE 1.2 09/30/2011 1130   GLUCOSE 201* 11/25/2011 1054   CALCIUM 9.4 11/25/2011 1054    Assessment: Disease Control: controlled  Progress toward goals: at goal  Barriers to meeting goals: no barriers identified      Patient is compliant most of the time with prescribed medications.   Plan:  continue current medications  Educational resources provided: brochure  Self management tools provided: home blood pressure logbook

## 2012-02-04 NOTE — Assessment & Plan Note (Addendum)
Pertinent Labs: Lab Results  Component Value Date   HGBA1C 8.0 02/02/2012   HGBA1C 7.2* 09/18/2010   CREATININE 0.94 11/25/2011   CREATININE 1.2 09/30/2011   MICROALBUR 0.59 05/29/2011   MICRALBCREAT 13.4 05/29/2011   CHOL 130 10/23/2011   HDL 33* 10/23/2011   TRIG 317* 10/23/2011    Assessment: Disease Control: not controlled  Progress toward goals: deteriorated  Barriers to meeting goals: no barriers identified  Microvascular complications: peripheral neuropathy  Macrovascular complications: cerebrovascular disease and peripheral vascular disease  On aspirin: yes  On statin: Yes  On ACE-I/ARB: Yes    Patient's blood sugars remain elevated throughout the day in the 200s, but do not progressively increase after eating. This therefore indicates that she likely is not getting enough basal insulin. However, her current meal-time coverage seems to be appropriately covering her meals.    Unfortunately, she is not really following a specific meal-time insulin regimen. She is basing her WC dosage on what she eats, no longer using the SSI in addition to meal-coverage.  Typically takes 17-20 units TID with each meal.  She needs to have some method to insulin dosing - therefore, she is advised to start using the meal-coverage and the correctional insulin per last SSI.  Patient is compliant most of the time with prescribed medications.   Plan: Glucometer log was reviewed today, as pt did have glucometer available for review.   Escalate Levemir to 58 units qday - she will get a magnifying glass so she can more accurately see how much she is taking. If within a week, continues to have elevated blood sugars > 150 mg/dL --> she will increase to 60 units.  Increase meal coverage to 13 units TIDWC + SSI.  reminded to bring blood glucose meter & log to each visit  Will have pt see Butch Penny prior to next visit, to help with adjustment of her insulin.

## 2012-02-06 NOTE — Assessment & Plan Note (Addendum)
Pertinent Data: DEXA (12/09/2011) - AP Spine L1-L4 with T-score of -3.7, Dual Femor (left neck) T score -1.4  Assessment: Patient noted to have osteoporosis with progression from prior 12/2004 (T-score of L spine -2.6 at that time). The patient was treated with fosamax from 2008-2009, and then stopped the medication per her preference. It is unclear per chart review what the issue was with the fosamax (intolerance, side effect, etc).  Plan:      As I am on nights, I am coordinating with our triage nurse, Bonnita Nasuti to call Ms. Harnden and clarify why she decided to stop the Alendronate in the past.  After clarification with Ms. Ballow, I will plan to start alendronate 88m once weekly on an empty stomach, to stay upright for 30-60 minutes after taking the medication.

## 2012-02-09 ENCOUNTER — Other Ambulatory Visit: Payer: Medicare Other

## 2012-02-09 ENCOUNTER — Ambulatory Visit: Payer: Medicare Other | Admitting: Surgery

## 2012-02-13 ENCOUNTER — Telehealth: Payer: Self-pay | Admitting: *Deleted

## 2012-02-13 NOTE — Assessment & Plan Note (Signed)
Assessment: Patient is on continuous home oxygen for her COPD and her severe pulmonary hypertension. She wears the oxygen as instructed, notes improvement of symptoms with the oxygen and uses the portable system within her home. She has briefly restarted smoking, which she states she has just discontinued again. We again discussed tobacco cessation and she plans to start the patches.  Plan:      Continue home oxygen.  O2 sat was checked at rest and with ambulation with O2 sat < 88% with both levels of activity, indicating need for continued use.  Instructed to STOP smoking and do not use home oxygen and smoke 2/2 severe risks and dangers.

## 2012-02-13 NOTE — Telephone Encounter (Signed)
Dr Guy Sandifer wants to start pt on fosamax and wants to know why pt quit taking several years ago, will try again

## 2012-02-17 ENCOUNTER — Ambulatory Visit (HOSPITAL_COMMUNITY)
Admission: RE | Admit: 2012-02-17 | Discharge: 2012-02-17 | Disposition: A | Discharge status: Home / Self Care | Payer: Medicare Other | Source: Ambulatory Visit | Attending: Internal Medicine | Admitting: Internal Medicine

## 2012-02-17 DIAGNOSIS — J4489 Other specified chronic obstructive pulmonary disease: Secondary | ICD-10-CM | POA: Insufficient documentation

## 2012-02-17 DIAGNOSIS — J449 Chronic obstructive pulmonary disease, unspecified: Secondary | ICD-10-CM

## 2012-02-17 MED ORDER — ALBUTEROL SULFATE (5 MG/ML) 0.5% IN NEBU
2.5000 mg | INHALATION_SOLUTION | Freq: Once | RESPIRATORY_TRACT | Status: AC
  Administered 2012-02-17: 2.5 mg via RESPIRATORY_TRACT

## 2012-02-17 NOTE — Telephone Encounter (Signed)
Pt states she has never taken it, she took boniva and had bowel incont. And so she never took the fosamax. She is willing to try it now. She states she went to the PFT's this am and did very badly, she was given a breathing tx and then the PFT's were done again and improved, she wonders if she will be getting nebs now, as soon as you review the results she would like for you to call her, i assured her that you would be in touch.

## 2012-02-18 NOTE — Telephone Encounter (Signed)
I will try to call her either this evening or next week when I am on days again. I do not have results of the PFTs.   I want to send [name] to the pulmonologist for continued care of her COPD as well, given it's severity.  Thank you! - [name], D.O., 02/18/2012, 4:34 AM

## 2012-02-19 ENCOUNTER — Telehealth: Payer: Self-pay | Admitting: Internal Medicine

## 2012-02-19 NOTE — Telephone Encounter (Signed)
  INTERNAL MEDICINE RESIDENCY PROGRAM After-Hours Telephone Call    Reason for call:   I placed an outgoing call to [name] on 02/19/2012 at 9:37 PM regarding results of her recent DEXA scan that show persistent osteoporosis, that is slightly worse than prior. The patient is know to have osteoporosis, was previously tried on boniva, but had loose stools with this medication. She was supposed to start on fosamax, but since she is had that prior reaction, she never started the fosamax.   [name] also indicated that she has recently developed hip pain, that has been disturbing to her, she has not had recurrent fall or injury to this area. She is hesitant to come to clinic until our appointment in April, states that the pain is tolerable at this time.     Assessment/ Plan:   Osteoporosis - start alendronate once weekly. Patient was counseled on appropriate use of the medication. As well, she will be started on vitamin D/Ca. I am sending a letter that also details proper use of bisphosphonates.   Hip pain - has had negative Hip XR in 05/2011. I told her that there are multiple pathologies that could be attributing towards her pain. She understands that I cannot elucidate this over the phone, she would prefer to wait until our mutual appt in 05/2012 to have this issue addressed, but agrees that if this is worsening that she should present immediately for review.   As always, pt is advised that if symptoms worsen or new symptoms arise, they should go to an urgent care facility or to to ER for further evaluation.    Kathryn Dawley, DO   02/19/2012, 9:36 PM

## 2012-02-24 ENCOUNTER — Ambulatory Visit (HOSPITAL_COMMUNITY): Payer: Medicare Other

## 2012-03-02 ENCOUNTER — Other Ambulatory Visit: Payer: Self-pay | Admitting: *Deleted

## 2012-03-02 ENCOUNTER — Ambulatory Visit (HOSPITAL_COMMUNITY): Payer: Medicare Other

## 2012-03-02 DIAGNOSIS — K219 Gastro-esophageal reflux disease without esophagitis: Secondary | ICD-10-CM

## 2012-03-02 DIAGNOSIS — M810 Age-related osteoporosis without current pathological fracture: Secondary | ICD-10-CM

## 2012-03-02 NOTE — Telephone Encounter (Signed)
Pt called and has not received the Rx for fosamax.  Please send in.

## 2012-03-03 MED ORDER — OMEPRAZOLE 40 MG PO CPDR: 40.0000 mg | DELAYED_RELEASE_CAPSULE | Freq: Two times a day (BID) | ORAL | Status: DC

## 2012-03-03 MED ORDER — ALENDRONATE SODIUM 70 MG PO TABS: 70.0000 mg | ORAL_TABLET | ORAL | Status: DC

## 2012-03-04 ENCOUNTER — Telehealth: Payer: Self-pay | Admitting: Internal Medicine

## 2012-03-04 DIAGNOSIS — M79609 Pain in unspecified limb: Secondary | ICD-10-CM | POA: Insufficient documentation

## 2012-03-04 DIAGNOSIS — M169 Osteoarthritis of hip, unspecified: Secondary | ICD-10-CM | POA: Insufficient documentation

## 2012-03-04 DIAGNOSIS — Z7982 Long term (current) use of aspirin: Secondary | ICD-10-CM | POA: Insufficient documentation

## 2012-03-04 DIAGNOSIS — I2789 Other specified pulmonary heart diseases: Secondary | ICD-10-CM | POA: Insufficient documentation

## 2012-03-04 DIAGNOSIS — F329 Major depressive disorder, single episode, unspecified: Secondary | ICD-10-CM | POA: Insufficient documentation

## 2012-03-04 DIAGNOSIS — Z8739 Personal history of other diseases of the musculoskeletal system and connective tissue: Secondary | ICD-10-CM | POA: Insufficient documentation

## 2012-03-04 DIAGNOSIS — E119 Type 2 diabetes mellitus without complications: Secondary | ICD-10-CM | POA: Insufficient documentation

## 2012-03-04 DIAGNOSIS — Z8619 Personal history of other infectious and parasitic diseases: Secondary | ICD-10-CM | POA: Insufficient documentation

## 2012-03-04 DIAGNOSIS — F411 Generalized anxiety disorder: Secondary | ICD-10-CM | POA: Insufficient documentation

## 2012-03-04 DIAGNOSIS — Z85528 Personal history of other malignant neoplasm of kidney: Secondary | ICD-10-CM | POA: Insufficient documentation

## 2012-03-04 DIAGNOSIS — J449 Chronic obstructive pulmonary disease, unspecified: Secondary | ICD-10-CM | POA: Insufficient documentation

## 2012-03-04 DIAGNOSIS — J4489 Other specified chronic obstructive pulmonary disease: Secondary | ICD-10-CM | POA: Insufficient documentation

## 2012-03-04 DIAGNOSIS — M1610 Unilateral primary osteoarthritis, unspecified hip: Secondary | ICD-10-CM | POA: Insufficient documentation

## 2012-03-04 DIAGNOSIS — Z79899 Other long term (current) drug therapy: Secondary | ICD-10-CM | POA: Insufficient documentation

## 2012-03-04 DIAGNOSIS — F17200 Nicotine dependence, unspecified, uncomplicated: Secondary | ICD-10-CM | POA: Insufficient documentation

## 2012-03-04 DIAGNOSIS — Z8669 Personal history of other diseases of the nervous system and sense organs: Secondary | ICD-10-CM | POA: Insufficient documentation

## 2012-03-04 DIAGNOSIS — Z794 Long term (current) use of insulin: Secondary | ICD-10-CM | POA: Insufficient documentation

## 2012-03-04 DIAGNOSIS — Z860100 Personal history of colon polyps, unspecified: Secondary | ICD-10-CM | POA: Insufficient documentation

## 2012-03-04 DIAGNOSIS — R1012 Left upper quadrant pain: Secondary | ICD-10-CM | POA: Insufficient documentation

## 2012-03-04 DIAGNOSIS — M549 Dorsalgia, unspecified: Secondary | ICD-10-CM | POA: Insufficient documentation

## 2012-03-04 DIAGNOSIS — D649 Anemia, unspecified: Secondary | ICD-10-CM | POA: Insufficient documentation

## 2012-03-04 DIAGNOSIS — F3289 Other specified depressive episodes: Secondary | ICD-10-CM | POA: Insufficient documentation

## 2012-03-04 DIAGNOSIS — Z8719 Personal history of other diseases of the digestive system: Secondary | ICD-10-CM | POA: Insufficient documentation

## 2012-03-04 DIAGNOSIS — Z8679 Personal history of other diseases of the circulatory system: Secondary | ICD-10-CM | POA: Insufficient documentation

## 2012-03-04 DIAGNOSIS — E785 Hyperlipidemia, unspecified: Secondary | ICD-10-CM | POA: Insufficient documentation

## 2012-03-04 NOTE — Telephone Encounter (Signed)
INTERNAL MEDICINE RESIDENCY PROGRAM After-Hours Telephone Call    Reason for call:   I received a call from [name] on 03/04/2012 at 1040 pm.  Patient with history of chronic hip pain and osteoporosis reported worsening of her hip pains over past 3 days with left side worse than right side.  Patient described her hip pain as constant nagging pain.  Patient stated that she could not get comfortable and hurt all the time regardless her position.  She reported that resting, standing or walking could all make pain worse. She had been taking her usual pain medication hydrocodone and-acetaminophen 5/325 without any improvement.       Pertinent Data:   Per chart review, she has had chronic osteoporosis with a recent DEXA scan that showed persistent osteoporosis, that was slightly worse than prior.  She was unable to tolerate Boniva in the past, and was started with alendronate once weekly recently.  She called her primary care physician Dr. Doy Horn with regards to her recently developed hip pain on 02/19/12.  However she was hesitant to come to the clinic until April when Dr. Leane Horn has openings.  Per Dr. Doy Horn note, patient was instructed to go to urgent care or ED for evaluation if her hip pain worsened.     Assessment / Plan / Recommendations:   I instruct patient that it is a very difficult for me to tell what is going on with her hips without examining her.  I instruct patient to go to a 24 hour urgent care or ED for further evaluation.   As always, pt is advised that if symptoms worsen or new symptoms arise, they should go to an urgent care facility or to to ER for further evaluation.    Kathryn Lange, MD   03/04/2012, 11:18 PM

## 2012-03-05 ENCOUNTER — Emergency Department (HOSPITAL_COMMUNITY): Payer: Medicare Other

## 2012-03-05 ENCOUNTER — Encounter (HOSPITAL_COMMUNITY): Payer: Self-pay | Admitting: Emergency Medicine

## 2012-03-05 ENCOUNTER — Emergency Department (HOSPITAL_COMMUNITY)
Admission: EM | Admit: 2012-03-05 | Discharge: 2012-03-05 | Disposition: A | Discharge status: Home / Self Care | Payer: Medicare Other | Attending: Emergency Medicine | Admitting: Emergency Medicine

## 2012-03-05 DIAGNOSIS — M1612 Unilateral primary osteoarthritis, left hip: Secondary | ICD-10-CM

## 2012-03-05 DIAGNOSIS — R1012 Left upper quadrant pain: Secondary | ICD-10-CM

## 2012-03-05 DIAGNOSIS — M1611 Unilateral primary osteoarthritis, right hip: Secondary | ICD-10-CM

## 2012-03-05 LAB — COMPREHENSIVE METABOLIC PANEL
ALT: 14 U/L (ref 0–35)
AST: 13 U/L (ref 0–37)
Albumin: 3.9 g/dL (ref 3.5–5.2)
Alkaline Phosphatase: 65 U/L (ref 39–117)
BUN: 28 mg/dL — ABNORMAL HIGH (ref 6–23)
CO2: 30 mEq/L (ref 19–32)
Calcium: 9.8 mg/dL (ref 8.4–10.5)
Chloride: 95 mEq/L — ABNORMAL LOW (ref 96–112)
Creatinine, Ser: 1.16 mg/dL — ABNORMAL HIGH (ref 0.50–1.10)
GFR calc Af Amer: 57 mL/min — ABNORMAL LOW (ref >90)
GFR calc non Af Amer: 49 mL/min — ABNORMAL LOW (ref >90)
Glucose, Bld: 212 mg/dL — ABNORMAL HIGH (ref 70–99)
Potassium: 5 mEq/L (ref 3.5–5.1)
Sodium: 136 mEq/L (ref 135–145)
Total Bilirubin: 0.2 mg/dL — ABNORMAL LOW (ref 0.3–1.2)
Total Protein: 7.5 g/dL (ref 6.0–8.3)

## 2012-03-05 LAB — CBC WITH DIFFERENTIAL/PLATELET
Basophils Absolute: 0.1 10*3/uL (ref 0.0–0.1)
Basophils Relative: 1 % (ref 0–1)
Eosinophils Absolute: 0.2 10*3/uL (ref 0.0–0.7)
Eosinophils Relative: 2 % (ref 0–5)
HCT: 41.2 % (ref 36.0–46.0)
Hemoglobin: 13.9 g/dL (ref 12.0–15.0)
Lymphocytes Relative: 24 % (ref 12–46)
Lymphs Abs: 2.2 10*3/uL (ref 0.7–4.0)
MCH: 31.7 pg (ref 26.0–34.0)
MCHC: 33.7 g/dL (ref 30.0–36.0)
MCV: 94.1 fL (ref 78.0–100.0)
Monocytes Absolute: 0.8 10*3/uL (ref 0.1–1.0)
Monocytes Relative: 9 % (ref 3–12)
Neutro Abs: 5.8 10*3/uL (ref 1.7–7.7)
Neutrophils Relative %: 64 % (ref 43–77)
Platelets: 207 10*3/uL (ref 150–400)
RBC: 4.38 MIL/uL (ref 3.87–5.11)
RDW: 14.7 % (ref 11.5–15.5)
WBC: 9 10*3/uL (ref 4.0–10.5)

## 2012-03-05 LAB — URINALYSIS, ROUTINE W REFLEX MICROSCOPIC
Bilirubin Urine: NEGATIVE
Glucose, UA: NEGATIVE mg/dL
Hgb urine dipstick: NEGATIVE
Ketones, ur: NEGATIVE mg/dL
Leukocytes, UA: NEGATIVE
Nitrite: NEGATIVE
Protein, ur: NEGATIVE mg/dL
Specific Gravity, Urine: 1.011 (ref 1.005–1.030)
Urobilinogen, UA: 0.2 mg/dL (ref 0.0–1.0)
pH: 5 (ref 5.0–8.0)

## 2012-03-05 LAB — LIPASE, BLOOD: Lipase: 18 U/L (ref 11–59)

## 2012-03-05 MED ORDER — IOHEXOL 300 MG/ML  SOLN
50.0000 mL | Freq: Once | INTRAMUSCULAR | Status: AC | PRN
  Administered 2012-03-05: 50 mL via ORAL

## 2012-03-05 MED ORDER — GI COCKTAIL ~~LOC~~
30.0000 mL | Freq: Once | ORAL | Status: AC
  Administered 2012-03-05: 30 mL via ORAL
  Filled 2012-03-05: qty 30

## 2012-03-05 MED ORDER — HYDROCODONE-ACETAMINOPHEN 7.5-500 MG PO TABS: 1.0000 | ORAL_TABLET | Freq: Four times a day (QID) | ORAL | Status: DC | PRN

## 2012-03-05 MED ORDER — IOHEXOL 300 MG/ML  SOLN
100.0000 mL | Freq: Once | INTRAMUSCULAR | Status: AC | PRN
  Administered 2012-03-05: 100 mL via INTRAVENOUS

## 2012-03-05 MED ORDER — HYDROMORPHONE HCL PF 1 MG/ML IJ SOLN
1.0000 mg | Freq: Once | INTRAMUSCULAR | Status: AC
  Administered 2012-03-05: 1 mg via INTRAVENOUS
  Filled 2012-03-05: qty 1

## 2012-03-05 NOTE — Discharge Instructions (Signed)
Your CT scan today shows scarring from your previous surgery on your kidney, but no other new changes. Your hip pain is most likely due to to your ongoing osteo-arthritis as seen on your bone density study. Followup with your primary care Dr. for further treatment.  Abdominal Pain (Nonspecific) Your exam might not show the exact reason you have abdominal pain. Since there are many different causes of abdominal pain, another checkup and more tests may be needed. It is very important to follow up for lasting (persistent) or worsening symptoms. A possible cause of abdominal pain in any person who still has his or her appendix is acute appendicitis. Appendicitis is often hard to diagnose. Normal blood tests, urine tests, ultrasound, and CT scans do not completely rule out early appendicitis or other causes of abdominal pain. Sometimes, only the changes that happen over time will allow appendicitis and other causes of abdominal pain to be determined. Other potential problems that may require surgery may also take time to become more apparent. Because of this, it is important that you follow all of the instructions below. HOME CARE INSTRUCTIONS   Rest as much as possible.  Do not eat solid food until your pain is gone.  While adults or children have pain: A diet of water, weak decaffeinated tea, broth or bouillon, gelatin, oral rehydration solutions (ORS), frozen ice pops, or ice chips may be helpful.  When pain is gone in adults or children: Start a light diet (dry toast, crackers, applesauce, or white rice). Increase the diet slowly as long as it does not bother you. Eat no dairy products (including cheese and eggs) and no spicy, fatty, fried, or high-fiber foods.  Use no alcohol, caffeine, or cigarettes.  Take your regular medicines unless your caregiver told you not to.  Take any prescribed medicine as directed.  Only take over-the-counter or prescription medicines for pain, discomfort, or fever as  directed by your caregiver. Do not give aspirin to children. If your caregiver has given you a follow-up appointment, it is very important to keep that appointment. Not keeping the appointment could result in a permanent injury and/or lasting (chronic) pain and/or disability. If there is any problem keeping the appointment, you must call to reschedule.  SEEK IMMEDIATE MEDICAL CARE IF:   Your pain is not gone in 24 hours.  Your pain becomes worse, changes location, or feels different.  You or your child has an oral temperature above 102 F (38.9 C), not controlled by medicine.  Your baby is older than 3 months with a rectal temperature of 102 F (38.9 C) or higher.  Your baby is 27 months old or younger with a rectal temperature of 100.4 F (38 C) or higher.  You have shaking chills.  You keep throwing up (vomiting) or cannot drink liquids.  There is blood in your vomit or you see blood in your bowel movements.  Your bowel movements become dark or black.  You have frequent bowel movements.  Your bowel movements stop (become blocked) or you cannot pass gas.  You have bloody, frequent, or painful urination.  You have yellow discoloration in the skin or whites of the eyes.  Your stomach becomes bloated or bigger.  You have dizziness or fainting.  You have chest or back pain. MAKE SURE YOU:   Understand these instructions.  Will watch your condition.  Will get help right away if you are not doing well or get worse. Document Released: 01/27/2005 Document Revised: 04/21/2011 Document Reviewed:  12/25/2008 Exit[employer] Patient Information 2013 [place].  Osteoarthritis Osteoarthritis is the most common form of arthritis. It is redness, soreness, and swelling (inflammation) affecting the cartilage. Cartilage acts as a cushion, covering the ends of bones where they meet to form a joint. CAUSES  Over time, the cartilage begins to wear away. This causes bone to rub on bone.  This produces pain and stiffness in the affected joints. Factors that contribute to this problem are:  Excessive body weight.  Age.  Overuse of joints. SYMPTOMS   People with osteoarthritis usually experience joint pain, swelling, or stiffness.  Over time, the joint may lose its normal shape.  Small deposits of bone (osteophytes) may grow on the edges of the joint.  Bits of bone or cartilage can break off and float inside the joint space. This may cause more pain and damage.  Osteoarthritis can lead to depression, anxiety, feelings of helplessness, and limitations on daily activities. The most commonly affected joints are in the:  Ends of the fingers.  Thumbs.  Neck.  Lower back.  Knees.  Hips. DIAGNOSIS  Diagnosis is mostly based on your symptoms and exam. Tests may be helpful, including:  X-rays of the affected joint.  A computerized magnetic scan (MRI).  Blood tests to rule out other types of arthritis.  Joint fluid tests. This involves using a needle to draw fluid from the joint and examining the fluid under a microscope. TREATMENT  Goals of treatment are to control pain, improve joint function, maintain a normal body weight, and maintain a healthy lifestyle. Treatment approaches may include:  A prescribed exercise program with rest and joint relief.  Weight control with nutritional education.  Pain relief techniques such as:  Properly applied heat and cold.  Electric pulses delivered to nerve endings under the skin (transcutaneous electrical nerve stimulation, TENS).  Massage.  Certain supplements. Ask your caregiver before using any supplements, especially in combination with prescribed drugs.  Medicines to control pain, such as:  Acetaminophen.  Nonsteroidal anti-inflammatory drugs (NSAIDs), such as naproxen.  Narcotic or central-acting agents, such as tramadol. This drug carries a risk of addiction and is generally prescribed for short-term  use.  Corticosteroids. These can be given orally or as injection. This is a short-term treatment, not recommended for routine use.  Surgery to reposition the bones and relieve pain (osteotomy) or to remove loose pieces of bone and cartilage. Joint replacement may be needed in advanced states of osteoarthritis. HOME CARE INSTRUCTIONS  Your caregiver can recommend specific types of exercise. These may include:  Strengthening exercises. These are done to strengthen the muscles that support joints affected by arthritis. They can be performed with weights or with exercise bands to add resistance.  Aerobic activities. These are exercises, such as brisk walking or low-impact aerobics, that get your heart pumping. They can help keep your lungs and circulatory system in shape.  Range-of-motion activities. These keep your joints limber.  Balance and agility exercises. These help you maintain daily living skills. Learning about your condition and being actively involved in your care will help improve the course of your osteoarthritis. SEEK MEDICAL CARE IF:   You feel hot or your skin turns red.  You develop a rash in addition to your joint pain.  You have an oral temperature above 102 F (38.9 C). [place] of Arthritis and Musculoskeletal and Skin Diseases: www.niams.SouthExposed.es [employer] on Aging: http://kim-miller.com/ American College of Rheumatology: www.rheumatology.org Document Released: 01/27/2005 Document Revised: 04/21/2011  Document Reviewed: 05/10/2009 [facility] Patient Information 2013 [place].

## 2012-03-05 NOTE — ED Provider Notes (Signed)
History     CSN: [id number]  Arrival date & time 03/04/12  2344   First MD Initiated Contact with Patient 03/05/12 [phone]      Chief Complaint  Patient presents with  . Hip Pain  . Leg Pain  . Back Pain    (Consider location/radiation/quality/duration/timing/severity/associated sxs/prior treatment) HPI 62 yo female presents to the ER with two complaints.  Pt reports h/o left renal cancer s/p ablation in August.  She reports normal scan after surgery.  Starting about 2 months after the ablation she developed LUQ pain.  No n/v, no change in eating habits, no change in bowel habits.  Pain is daily, some days worse than others.  No chest pain, no sob.  Pain has been worsening over the last week.  Pt also c/o pain to bilateral hips. Initially with left hip pain, taking vicodin that is not helping.  Pain now in both hips.  Pain is worse with standing, walking, and improves with sitting or lying down.  Pt c/o pain "over my entire body" along with headache since sitting in the waiting room.  She denies h/o arthritis.  Pt is followed by [facility], is not able to see her pcm until April.  Past Medical History  Diagnosis Date  . Depression   . GERD (gastroesophageal reflux disease)     Followed by [name] // small bowel capsule endoscopy (11/2009) - minimal chronic inflammation, otherwise negative for  H.pylori, metaplasia, dysplasia  . Hypertension   . Hyperlipidemia   . Diabetes mellitus type II, uncontrolled     on insulin therapy  . COPD (chronic obstructive pulmonary disease) 2008    started home O2 (12/2010) // Prior significant smoker  // her CT of the chest there was no evidence of primary carcinoid tumor , scattered normal-sized mediastinal and hilar as well as axillary lymph nodes noted., no significant lymphadenopathy. COPD and emphysema with no acute cardiopulmonary disease noted , mild cardiomegaly and solitary calcified  plaque  . Moderate to severe pulmonary hypertension     severe per  TEE (08/2010) - Peak RV-RA gradient 76m Hg  . Mitral regurgitation     mild to moderate per TEE (08/2010)  . Shingles   . Anemia     Anemia panel (08/2009) - iron 21, TIBC 441, UIBC 420,  ferritin 11 // Pt has been unable to tolerate iron supplementation due to chronic abdominal discomfort, source of which still is not clear. // Repeat EGD (05/2011) negative, colonoscopy (05/2011) showing only int hemorrhoids, no specific cause of anemia  . History of colonic polyps     Colonoscopy (10/2005) - colon polyp with path nonadenomatous, no malignancy - by Dr. [name] // Colonoscopy (02/2002) - 2 polyps noted, 1 - adenomatous, 2 - hyperplastic // Followed by Dr. [name]// Repeat Colonoscopy (05/2011) - single tubular adenoma polyp - performed by Dr. [name] . Anxiety   . IBS (irritable bowel syndrome)   . Osteoporosis     DEXA (12/2004) - L-spine T score -2.6, Left hip -0.1  . Fibromyalgia   . Obstructive sleep apnea     on CPAP // Sleep study (06/2009) - Moderate sleep apnea/ hypopnea syndrome , AHI 17.8 per hour with nonpositional hypopneas. CPAP titration to 12 CWP, AHI 2.4 per hour. Small resMed Quattro full-face mask with heated humidifier  . Restless leg syndrome   . Mitral stenosis     moderate per TEE (08/2010) // Mitral valve replacement with a 27-mm pericardial porcine valve (Medtronic  Mosaic valve, serial #[id number]) on 09/20/2010 - Tharon Aquas Trigt // Followed at [facility], Dr. Stanford Breed  . Bilateral carotid artery stenosis     s/p right endarterectomy (06/2010). // Carotid US (07/2010) -  60%-79% ICA stenosis, mid range of scale by velocity.  Left: Moderate-to-severe calcific and non-calcific plaque origin andproximal ICA and ECA.  60%-79% ICA stenosis, highest end of scale // Followed by [name].  . Peripheral vascular disease   . Internal hemorrhoids     small internal hemorrhoids noted on colonoscopy 05/2011  . Clear cell renal cell carcinoma 07/21/2011    S/P  cryoablation of left RCC in 09/2011 by [name] // Historically - MRI Abdomen (07/2011) - 2.7 x 2.7 x 3.0 cm enhancing solid renal mass posterior aspect of the lower pole of the left kidney. // CT Abdomen in 09/2010 showed no evidence of mass // Followed by [name]  [facility])   . Diastolic CHF     last echo (12/2010) showing grade 2 diastolic dysfunction. Otherwise, normal EF of 55-65%  . H/O mitral valve replacement 09/30/2011  . A-fib 10/22/2010    S/P Left atrial maze procedure for paroxysmal atrial fibrillation. 09/20/2010 Dr Prescott Gum     Past Surgical History  Procedure Date  . Orif clavicle fracture 01/2004    by Thana Farr. [name], M.D for Right clavicle nonunion.  . Tonsillectomy   . Carotid endarterectomy 07/04/2010    right, by [name] for asymptomatic right carotid artery stenosis  . Lipoma exision 08/2005    occipital lipoma 1.5cm - by Dr. [name]  . Hysteroscopy with endometrial ablation 06/2001    for persistent post-menopausal bleeding // by S. [name], M.D.  . Mitral valve replacement 09/20/10     with a 27-mm pericardial porcine valve (Medtronic Mosaic valve, serial #[id number]). 09/20/10, Dr Prescott Gum  . Left atrial maze procedure 09/20/10    for paroxysmal atrial fibrillation (Dr. Prescott Gum)  . Placement of left chest tube. 09/24/2010    Dr Prescott Gum  . Pr vein bypass graft,aorto-fem-pop May 2012  . Cardiac valve replacement Aug. 2012  . Colonoscopy 05/12/2011    performed by Dr. Michail Sermon. Showing small internal hemorrhoids, single tubular adenoma polyp  . Esophagogastroduodenoscopy 05/12/2011    performed by Dr. Michail Sermon. Negative for ulcerations, biopsy negative for evidence of celiac sprue  . Tubal ligation     Family History  Problem Relation Age of Onset  . Heart disease Father 50    died to MI at 5yo  . Heart attack Father   . Heart disease Brother 27    died of MI at 42yo  . Heart attack Brother     History  Substance Use Topics  .  Smoking status: Current Every Day Smoker -- 1.0 packs/day for 50 years    Types: Cigarettes    Last Attempt to Quit: 07/12/2010  . Smokeless tobacco: Never Used     Comment: quit 02/01/2012  . Alcohol Use: No    OB History    Grav Para Term Preterm Abortions TAB SAB Ect Mult Living                  Review of Systems  All other systems reviewed and are negative.    Allergies  Lorazepam; Morphine; Percocet; and Tramadol hcl  Home Medications   Current Outpatient Rx  Name  Route  Sig  Dispense  Refill  . ALBUTEROL SULFATE HFA 108 (90 BASE) MCG/ACT IN AERS  Inhalation   Inhale 2 puffs into the lungs every 6 (six) hours as needed. For wheeze or shortness of breath   3 Inhaler   3   . ALPRAZOLAM 1 MG PO TABS   Oral   Take 1 tablet (1 mg total) by mouth 3 (three) times daily as needed for sleep or anxiety.   90 tablet   5   . ASPIRIN 81 MG PO TABS   Oral   Take 81 mg by mouth daily with supper.          . BUPROPION HCL ER (XL) 150 MG PO TB24   Oral   Take 1 tablet (150 mg total) by mouth daily with supper.   90 tablet   3   . DOCUSATE SODIUM 50 MG PO CAPS   Oral   Take 100 mg by mouth every morning. Alternate with  senokot         . DULOXETINE HCL 30 MG PO CPEP      daily with breakfast. Take 3 tablets by mouth once daily         . FERROUS GLUCONATE 324 (38 FE) MG PO TABS   Oral   Take 1 tablet (324 mg total) by mouth 2 (two) times daily with a meal.   120 tablet   3   . FLUTICASONE-SALMETEROL 250-50 MCG/DOSE IN AEPB   Inhalation   Inhale 1 puff into the lungs every 12 (twelve) hours.   3 each   3   . FUROSEMIDE 80 MG PO TABS   Oral   Take 80 mg by mouth 2 (two) times daily.         Marland Kitchen GABAPENTIN 300 MG PO CAPS      TAKE 1 CAPSULE BY MOUTH TWICE DAILY   60 capsule   PRN   . INSULIN ASPART 100 UNIT/ML [place] SOLN   Subcutaneous   Inject 13-21 Units into the skin See admin instructions. Patient takes 13 units three times a day and adds on to  that based on her blood sugar         . INSULIN DETEMIR 100 UNIT/ML [place] SOLN   Subcutaneous   Inject 60 Units into the skin at bedtime.         Marland Kitchen LISINOPRIL 10 MG PO TABS   Oral   Take 1 tablet (10 mg total) by mouth daily.   30 tablet   3   . LORATADINE 10 MG PO TABS   Oral   Take 10 mg by mouth daily with supper.         . NYSTATIN 100000 UNIT/GM EX POWD      Apply to affected area 3 times daily   15 g   0   . OMEPRAZOLE 40 MG PO CPDR   Oral   Take 1 capsule (40 mg total) by mouth 2 (two) times daily.   60 capsule   5   . POTASSIUM CHLORIDE 20 MEQ PO PACK   Oral   Take 40 mEq by mouth 2 (two) times daily.         Marland Kitchen ROSUVASTATIN CALCIUM 20 MG PO TABS   Oral   Take 1 tablet (20 mg total) by mouth daily with supper.   30 tablet   5   . SENNA-DOCUSATE SODIUM 8.6-50 MG PO TABS   Oral   Take 2 tablets by mouth every other day. Hold for loose stools         . TIOTROPIUM  BROMIDE MONOHYDRATE 18 MCG IN CAPS   Inhalation   Place 1 capsule (18 mcg total) into inhaler and inhale daily with breakfast.   30 capsule   11   . ACCU-CHEK FASTCLIX LANCETS MISC   Does not apply   1 each by Does not apply route 3 (three) times daily before meals. Check blood sugars 3 times daily before breakfast, lunch, and dinner DX code: 250.02   102 each   12   . TRUFORM STOCKINGS 20-30MMHG MISC      Wear compression stockings throughout the day, and take off at nighttime.   2 each   1   . GLUCOSE BLOOD VI STRP      Use to test blood sugar 3 times daily.Dx code 250.00   100 each   12   . HYDROCODONE-ACETAMINOPHEN 7.5-500 MG PO TABS   Oral   Take 1 tablet by mouth every 6 (six) hours as needed for pain.   30 tablet   0   . INSULIN PEN NEEDLE 31G X 5 MM MISC      DX: 250.02 Please check blood sugars 3 times daily.   100 each   11   . INSULIN SYRINGE 31G X 5/16" 0.3 ML MISC   Does not apply   1 Syringe by Does not apply route 2 (two) times daily at 10 AM and 5  PM.   100 each   11     BP 125/64  Pulse 96  Temp 98.2 F (36.8 C) (Oral)  Resp 14  SpO2 95%  Physical Exam  Nursing note and vitals reviewed. Constitutional: She is oriented to person, place, and time. She appears well-developed and well-nourished.  HENT:  Head: Normocephalic and atraumatic.  Nose: Nose normal.  Mouth/Throat: Oropharynx is clear and moist.  Eyes: Conjunctivae normal and EOM are normal. Pupils are equal, round, and reactive to light.  Neck: Normal range of motion. Neck supple. No JVD present. No tracheal deviation present. No thyromegaly present.  Cardiovascular: Normal rate, regular rhythm, normal heart sounds and intact distal pulses.  Exam reveals no gallop and no friction rub.   No murmur heard. Pulmonary/Chest: Effort normal and breath sounds normal. No stridor. No respiratory distress. She has no wheezes. She has no rales. She exhibits no tenderness.       Prolonged expiratory phase  Abdominal: Soft. Bowel sounds are normal. She exhibits no distension and no mass. There is tenderness (LUQ pain, no masses, no rebound or guarding). There is no rebound and no guarding.  Musculoskeletal: Normal range of motion. She exhibits no edema and no tenderness.  Lymphadenopathy:    She has no cervical adenopathy.  Neurological: She is alert and oriented to person, place, and time. No cranial nerve deficit. She exhibits normal muscle tone. Coordination normal.  Skin: Skin is warm and dry. No rash noted. No erythema. No pallor.  Psychiatric: She has a normal mood and affect. Her behavior is normal. Judgment and thought content normal.    ED Course  Procedures (including critical care time)  Labs Reviewed  COMPREHENSIVE METABOLIC PANEL - Abnormal; Notable for the following:    Chloride 95 (*)     Glucose, Bld 212 (*)     BUN 28 (*)     Creatinine, Ser 1.16 (*)     Total Bilirubin 0.2 (*)     GFR calc non Af Amer 49 (*)     GFR calc Af Amer 57 (*)     All  other  components within normal limits  URINALYSIS, ROUTINE W REFLEX MICROSCOPIC - Abnormal; Notable for the following:    APPearance CLOUDY (*)     All other components within normal limits  CBC WITH DIFFERENTIAL  LIPASE, BLOOD   Ct Abdomen Pelvis W Contrast  03/05/2012  *RADIOLOGY REPORT*  Clinical Data: Cough, quadrant abdominal pain.  History of renal cancer.  The  CT ABDOMEN AND PELVIS WITH CONTRAST  Technique:  Multidetector CT imaging of the abdomen and pelvis was performed following the standard protocol during bolus administration of intravenous contrast.  Contrast: 160m OMNIPAQUE IOHEXOL 300 MG/ML  SOLN  Comparison: CT of the abdomen and performed 10/23/2011, and abdominal ultrasound performed 07/01/2011  Findings: Minimal left basilar atelectasis is noted.  Scattered calcification is noted at the mitral valve.  The liver and spleen are unremarkable in appearance.  The gallbladder is within normal limits.  The pancreas and adrenal glands are unremarkable.  A 5 mm stone is noted near the lower pole of the right kidney. There is marked scarring at the lower pole of the left kidney, with changes of prior cryoablation again seen.  This has retracted significantly since the prior CT.  Mild perinephric stranding is noted about the left kidney.  There is no evidence of hydronephrosis.  No obstructing ureteral stones are seen.  No free fluid is identified.  The small bowel is unremarkable in appearance.  The stomach is within normal limits.  No acute vascular abnormalities are seen.  Relatively diffuse calcification is noted along the abdominal aorta and its branches.  The appendix is normal in caliber, without evidence for appendicitis.  The colon is unremarkable in appearance.  The bladder is moderately distended and grossly unremarkable in appearance.  The uterus is within normal limits.  The ovaries are relatively symmetric; no suspicious adnexal masses are seen.  No inguinal lymphadenopathy is seen.  No  acute osseous abnormalities are identified.  IMPRESSION:  1.  No acute abnormality seen within the abdomen or pelvis. 2.  Postoperative changes noted at the lower pole of the left kidney; changes of cryoablation have retracted significantly since the prior study.  No evidence of recurrence of malignancy. 3.  5 mm stone noted at the lower pole of the right kidney.  No evidence of hydronephrosis; no obstructing ureteral stones seen. 4.  Relatively diffuse calcification along the abdominal aorta and its branches.   Original Report Authenticated By: JSanta Lighter M.D.      1. Abdominal pain, left upper quadrant   2. Osteoarthritis of left hip   3. Osteoarthritis of right hip       MDM  62year old female with chronic left upper quadrant pain for the last several months, who does have history of left sided renal cancer. She also has acute on chronic bilateral hip pain. Patient is followed outpatient clinics. Per their notes, she was noted to have osteoarthritis of bilateral hips on her bone density scan. CT scan does not show any acute masses in the left upper quadrant, no cancer progression noted. Patient has received 2 doses of Dilaudid. She is to followup with her primary care Dr.        OKalman Drape MD 03/05/12 2125

## 2012-03-05 NOTE — ED Notes (Signed)
PT. REPORTS CHRONIC BILATERAL HIP PAIN , LEFT LEG PAIN AND LEFT BACK PAIN FOR SEVERAL DAYS UNRELIEVED BY PRESCRIPTION PAIN MEDICATIONS DENIES INJURY OR FALL.

## 2012-03-09 ENCOUNTER — Telehealth: Payer: Self-pay | Admitting: Emergency Medicine

## 2012-03-09 NOTE — Telephone Encounter (Signed)
LMOAM TO MAKE PT AWARE THAT DR Kathlene Cote REVIEWD HER CT FROM JAN. 2014 AND EVERYTHING LOOKS GREAT FROM OUR ABLATION STANDPOINT AND WE WILL JUST SEE HER BACK AND SCAN HER AGAIN IN EARLY AUG,. 2014 , WHEN SHE WILL BE 43YR POST ABLATION.  SHE CALL CALL IF ANY ISSUES ARISE.

## 2012-03-30 ENCOUNTER — Other Ambulatory Visit: Payer: Self-pay | Admitting: *Deleted

## 2012-03-30 MED ORDER — INSULIN DETEMIR 100 UNIT/ML ~~LOC~~ SOLN: 60.0000 [IU] | Freq: Every day | SUBCUTANEOUS | Status: DC

## 2012-04-02 ENCOUNTER — Telehealth: Payer: Self-pay | Admitting: *Deleted

## 2012-04-02 DIAGNOSIS — M797 Fibromyalgia: Secondary | ICD-10-CM

## 2012-04-02 NOTE — Telephone Encounter (Signed)
Fax from [facility] - requesting refill on Hydrocodone/apap 5-344m tablets; Take 1 to 2 tab q6hr PRN. Qty #180. Thanks

## 2012-04-05 MED ORDER — HYDROCODONE-ACETAMINOPHEN 5-325 MG PO TABS: ORAL_TABLET | ORAL | Status: DC

## 2012-04-05 NOTE — Telephone Encounter (Signed)
Spoke w/ pt, she did not fill the script from the ED, she submitted the script for the pharmacy to destroy. She states she would like 5/329m #180/ month, she takes 2 three times daily. Please advise

## 2012-04-05 NOTE — Telephone Encounter (Addendum)
I am ok with that, can you please call in? Thank you for calling and sorting all of this out. I really appreciate it!!!  Thank you! - [name], D.O., 04/05/2012, 11:49 PM

## 2012-04-05 NOTE — Telephone Encounter (Signed)
Hi [name]! As we discussed over the phone, can you please call [name] and inquire as to how much of her pain medication in general she is requiring daily, so we can better estimate her monthly needs. She has previously informed me that she is not always needing to fill the medication monthly because she has extra left. I would like to titrate down the quantity instead of having inconsistencies in terms of the refills.  As well, please inform her that she has to notify the clinic if she is written a narcotic RX from anywhere aside from our clinic. She was given a RX for hydrocodone from the ED in Jan 2014 for #30 of Vicodin. I think this was likely a result of her possibly being out of medication since she did not get an [facility] refill in Jan. As well, I can only see the RX, I do not know it was filled. She has not given me reason to question her behavior or intentions with her medications in the past and I do not think she is inappropriately using her medications.  Regardless, I just want her to let us know if that happens again, because our pain contract very clearly indicates that narcotics can only be written from [facility].  Please let me know her medication needs, and I will be happy to fill the medication.  Thank you so much for your help.  Sincerely, Darrick Penna

## 2012-04-20 ENCOUNTER — Encounter (HOSPITAL_COMMUNITY): Payer: Self-pay

## 2012-04-20 ENCOUNTER — Encounter (HOSPITAL_COMMUNITY)
Admission: RE | Admit: 2012-04-20 | Discharge: 2012-04-20 | Disposition: A | Discharge status: Home / Self Care | Payer: Medicare Other | Source: Ambulatory Visit | Attending: Internal Medicine | Admitting: Internal Medicine

## 2012-04-20 DIAGNOSIS — Z952 Presence of prosthetic heart valve: Secondary | ICD-10-CM | POA: Insufficient documentation

## 2012-04-20 DIAGNOSIS — I739 Peripheral vascular disease, unspecified: Secondary | ICD-10-CM | POA: Insufficient documentation

## 2012-04-20 DIAGNOSIS — Z5189 Encounter for other specified aftercare: Secondary | ICD-10-CM | POA: Insufficient documentation

## 2012-04-20 DIAGNOSIS — I2510 Atherosclerotic heart disease of native coronary artery without angina pectoris: Secondary | ICD-10-CM | POA: Insufficient documentation

## 2012-04-20 DIAGNOSIS — Z951 Presence of aortocoronary bypass graft: Secondary | ICD-10-CM | POA: Insufficient documentation

## 2012-04-20 DIAGNOSIS — I509 Heart failure, unspecified: Secondary | ICD-10-CM | POA: Insufficient documentation

## 2012-04-20 DIAGNOSIS — I5032 Chronic diastolic (congestive) heart failure: Secondary | ICD-10-CM | POA: Insufficient documentation

## 2012-04-20 DIAGNOSIS — I4891 Unspecified atrial fibrillation: Secondary | ICD-10-CM | POA: Insufficient documentation

## 2012-04-20 DIAGNOSIS — I10 Essential (primary) hypertension: Secondary | ICD-10-CM | POA: Insufficient documentation

## 2012-04-20 NOTE — Progress Notes (Signed)
Pt participated in pulmonary rehab orientation today.  Pt oriented to program guidelines and participant expectations.  Pt instructed in purse-lip breathing and demonstrated understanding.   Pt alert and oriented x4.     Pt lungs course, faint rales scattered on left.  Heart regular rate and rhythm.  Bowel sounds active x4. Pt reports LBM-2 days ago.   Equal grip strength and lower extremity strength. trace pedal edema present.  VSS.  Appropriate achievable goals self identified by patient.  Pt verbalized understanding.  Pt scheduled to begin program  04/27/2012  .  6 minute walk test will be given after first day of exercise as pt wore sandals to orientation.  Pt instructed to wear closed toe, rubber soled shoes for exercise

## 2012-04-22 ENCOUNTER — Encounter (HOSPITAL_COMMUNITY): Payer: Medicare Other

## 2012-04-26 ENCOUNTER — Other Ambulatory Visit: Payer: Self-pay | Admitting: Internal Medicine

## 2012-04-26 DIAGNOSIS — J449 Chronic obstructive pulmonary disease, unspecified: Secondary | ICD-10-CM

## 2012-04-26 DIAGNOSIS — J441 Chronic obstructive pulmonary disease with (acute) exacerbation: Secondary | ICD-10-CM

## 2012-04-26 MED ORDER — AEROCHAMBER MV MISC: Status: DC

## 2012-04-27 ENCOUNTER — Encounter (HOSPITAL_COMMUNITY): Payer: Medicare Other

## 2012-04-28 ENCOUNTER — Other Ambulatory Visit: Payer: Self-pay | Admitting: *Deleted

## 2012-04-28 DIAGNOSIS — M797 Fibromyalgia: Secondary | ICD-10-CM

## 2012-04-28 MED ORDER — HYDROCODONE-ACETAMINOPHEN 5-325 MG PO TABS: ORAL_TABLET | ORAL | Status: DC

## 2012-04-28 NOTE — Telephone Encounter (Signed)
Last filled 2/24

## 2012-04-29 ENCOUNTER — Encounter (HOSPITAL_COMMUNITY): Payer: Medicare Other

## 2012-04-29 NOTE — Telephone Encounter (Signed)
Rx called in to pharmacy. 

## 2012-04-30 ENCOUNTER — Other Ambulatory Visit: Payer: Self-pay | Admitting: *Deleted

## 2012-04-30 DIAGNOSIS — E119 Type 2 diabetes mellitus without complications: Secondary | ICD-10-CM

## 2012-04-30 DIAGNOSIS — F419 Anxiety disorder, unspecified: Secondary | ICD-10-CM

## 2012-04-30 DIAGNOSIS — I10 Essential (primary) hypertension: Secondary | ICD-10-CM

## 2012-04-30 DIAGNOSIS — R252 Cramp and spasm: Secondary | ICD-10-CM

## 2012-04-30 DIAGNOSIS — D649 Anemia, unspecified: Secondary | ICD-10-CM

## 2012-04-30 DIAGNOSIS — J449 Chronic obstructive pulmonary disease, unspecified: Secondary | ICD-10-CM

## 2012-04-30 DIAGNOSIS — K219 Gastro-esophageal reflux disease without esophagitis: Secondary | ICD-10-CM

## 2012-04-30 MED ORDER — LISINOPRIL 10 MG PO TABS: 10.0000 mg | ORAL_TABLET | Freq: Every day | ORAL | Status: DC

## 2012-04-30 MED ORDER — ALPRAZOLAM 1 MG PO TABS: 1.0000 mg | ORAL_TABLET | Freq: Three times a day (TID) | ORAL | Status: DC | PRN

## 2012-04-30 NOTE — Telephone Encounter (Signed)
Please call in the patient's Alprazolam to her pharmacy.  Thank you!   [place], DO, 04/30/2012, 2:00 PM

## 2012-04-30 NOTE — Telephone Encounter (Signed)
Rx called in to pharmacy.

## 2012-05-03 ENCOUNTER — Telehealth: Payer: Self-pay | Admitting: Internal Medicine

## 2012-05-03 ENCOUNTER — Encounter (HOSPITAL_COMMUNITY): Payer: Self-pay | Admitting: Emergency Medicine

## 2012-05-03 ENCOUNTER — Telehealth: Payer: Self-pay | Admitting: *Deleted

## 2012-05-03 ENCOUNTER — Emergency Department (HOSPITAL_COMMUNITY)
Admission: EM | Admit: 2012-05-03 | Discharge: 2012-05-03 | Disposition: A | Discharge status: Home / Self Care | Payer: Medicare Other | Attending: Emergency Medicine | Admitting: Emergency Medicine

## 2012-05-03 ENCOUNTER — Emergency Department (HOSPITAL_COMMUNITY): Payer: Medicare Other

## 2012-05-03 DIAGNOSIS — K219 Gastro-esophageal reflux disease without esophagitis: Secondary | ICD-10-CM | POA: Insufficient documentation

## 2012-05-03 DIAGNOSIS — F3289 Other specified depressive episodes: Secondary | ICD-10-CM | POA: Insufficient documentation

## 2012-05-03 DIAGNOSIS — F411 Generalized anxiety disorder: Secondary | ICD-10-CM | POA: Insufficient documentation

## 2012-05-03 DIAGNOSIS — E785 Hyperlipidemia, unspecified: Secondary | ICD-10-CM | POA: Insufficient documentation

## 2012-05-03 DIAGNOSIS — Z862 Personal history of diseases of the blood and blood-forming organs and certain disorders involving the immune mechanism: Secondary | ICD-10-CM | POA: Insufficient documentation

## 2012-05-03 DIAGNOSIS — J45901 Unspecified asthma with (acute) exacerbation: Secondary | ICD-10-CM | POA: Insufficient documentation

## 2012-05-03 DIAGNOSIS — Z860100 Personal history of colon polyps, unspecified: Secondary | ICD-10-CM | POA: Insufficient documentation

## 2012-05-03 DIAGNOSIS — IMO0001 Reserved for inherently not codable concepts without codable children: Secondary | ICD-10-CM | POA: Insufficient documentation

## 2012-05-03 DIAGNOSIS — Z8639 Personal history of other endocrine, nutritional and metabolic disease: Secondary | ICD-10-CM | POA: Insufficient documentation

## 2012-05-03 DIAGNOSIS — Z79899 Other long term (current) drug therapy: Secondary | ICD-10-CM | POA: Insufficient documentation

## 2012-05-03 DIAGNOSIS — I270 Primary pulmonary hypertension: Secondary | ICD-10-CM | POA: Insufficient documentation

## 2012-05-03 DIAGNOSIS — I509 Heart failure, unspecified: Secondary | ICD-10-CM | POA: Insufficient documentation

## 2012-05-03 DIAGNOSIS — Z859 Personal history of malignant neoplasm, unspecified: Secondary | ICD-10-CM | POA: Insufficient documentation

## 2012-05-03 DIAGNOSIS — Z9981 Dependence on supplemental oxygen: Secondary | ICD-10-CM | POA: Insufficient documentation

## 2012-05-03 DIAGNOSIS — F17200 Nicotine dependence, unspecified, uncomplicated: Secondary | ICD-10-CM | POA: Insufficient documentation

## 2012-05-03 DIAGNOSIS — E119 Type 2 diabetes mellitus without complications: Secondary | ICD-10-CM | POA: Insufficient documentation

## 2012-05-03 DIAGNOSIS — F329 Major depressive disorder, single episode, unspecified: Secondary | ICD-10-CM | POA: Insufficient documentation

## 2012-05-03 DIAGNOSIS — Z8719 Personal history of other diseases of the digestive system: Secondary | ICD-10-CM | POA: Insufficient documentation

## 2012-05-03 DIAGNOSIS — J019 Acute sinusitis, unspecified: Secondary | ICD-10-CM

## 2012-05-03 DIAGNOSIS — J3489 Other specified disorders of nose and nasal sinuses: Secondary | ICD-10-CM | POA: Insufficient documentation

## 2012-05-03 DIAGNOSIS — J441 Chronic obstructive pulmonary disease with (acute) exacerbation: Secondary | ICD-10-CM

## 2012-05-03 DIAGNOSIS — Z7982 Long term (current) use of aspirin: Secondary | ICD-10-CM | POA: Insufficient documentation

## 2012-05-03 DIAGNOSIS — Z794 Long term (current) use of insulin: Secondary | ICD-10-CM | POA: Insufficient documentation

## 2012-05-03 DIAGNOSIS — Z8679 Personal history of other diseases of the circulatory system: Secondary | ICD-10-CM | POA: Insufficient documentation

## 2012-05-03 DIAGNOSIS — M810 Age-related osteoporosis without current pathological fracture: Secondary | ICD-10-CM | POA: Insufficient documentation

## 2012-05-03 DIAGNOSIS — I10 Essential (primary) hypertension: Secondary | ICD-10-CM | POA: Insufficient documentation

## 2012-05-03 LAB — URINALYSIS, ROUTINE W REFLEX MICROSCOPIC
Bilirubin Urine: NEGATIVE
Glucose, UA: NEGATIVE mg/dL
Ketones, ur: NEGATIVE mg/dL
Leukocytes, UA: NEGATIVE
Nitrite: NEGATIVE
Protein, ur: NEGATIVE mg/dL
Specific Gravity, Urine: 1.019 (ref 1.005–1.030)
Urobilinogen, UA: 0.2 mg/dL (ref 0.0–1.0)
pH: 5.5 (ref 5.0–8.0)

## 2012-05-03 LAB — COMPREHENSIVE METABOLIC PANEL
ALT: 17 U/L (ref 0–35)
AST: 16 U/L (ref 0–37)
Albumin: 3.8 g/dL (ref 3.5–5.2)
Alkaline Phosphatase: 63 U/L (ref 39–117)
BUN: 26 mg/dL — ABNORMAL HIGH (ref 6–23)
CO2: 31 mEq/L (ref 19–32)
Calcium: 10.2 mg/dL (ref 8.4–10.5)
Chloride: 99 mEq/L (ref 96–112)
Creatinine, Ser: 1.16 mg/dL — ABNORMAL HIGH (ref 0.50–1.10)
GFR calc Af Amer: 57 mL/min — ABNORMAL LOW (ref >90)
GFR calc non Af Amer: 49 mL/min — ABNORMAL LOW (ref >90)
Glucose, Bld: 216 mg/dL — ABNORMAL HIGH (ref 70–99)
Potassium: 4.9 mEq/L (ref 3.5–5.1)
Sodium: 139 mEq/L (ref 135–145)
Total Bilirubin: 0.3 mg/dL (ref 0.3–1.2)
Total Protein: 7.3 g/dL (ref 6.0–8.3)

## 2012-05-03 LAB — POCT I-STAT TROPONIN I: Troponin i, poc: 0 ng/mL (ref 0.00–0.08)

## 2012-05-03 LAB — CBC WITH DIFFERENTIAL/PLATELET
Basophils Absolute: 0 10*3/uL (ref 0.0–0.1)
Basophils Relative: 1 % (ref 0–1)
Eosinophils Absolute: 0.1 10*3/uL (ref 0.0–0.7)
Eosinophils Relative: 1 % (ref 0–5)
HCT: 39.9 % (ref 36.0–46.0)
Hemoglobin: 13.7 g/dL (ref 12.0–15.0)
Lymphocytes Relative: 12 % (ref 12–46)
Lymphs Abs: 0.8 10*3/uL (ref 0.7–4.0)
MCH: 31.9 pg (ref 26.0–34.0)
MCHC: 34.3 g/dL (ref 30.0–36.0)
MCV: 92.8 fL (ref 78.0–100.0)
Monocytes Absolute: 1.4 10*3/uL — ABNORMAL HIGH (ref 0.1–1.0)
Monocytes Relative: 19 % — ABNORMAL HIGH (ref 3–12)
Neutro Abs: 4.8 10*3/uL (ref 1.7–7.7)
Neutrophils Relative %: 67 % (ref 43–77)
Platelets: 175 10*3/uL (ref 150–400)
RBC: 4.3 MIL/uL (ref 3.87–5.11)
RDW: 14.4 % (ref 11.5–15.5)
WBC: 7.1 10*3/uL (ref 4.0–10.5)

## 2012-05-03 LAB — URINE MICROSCOPIC-ADD ON

## 2012-05-03 MED ORDER — HYDROCODONE-ACETAMINOPHEN 5-325 MG PO TABS
2.0000 | ORAL_TABLET | Freq: Once | ORAL | Status: AC
  Administered 2012-05-03: 2 via ORAL
  Filled 2012-05-03: qty 2

## 2012-05-03 MED ORDER — ALBUTEROL SULFATE (5 MG/ML) 0.5% IN NEBU
5.0000 mg | INHALATION_SOLUTION | Freq: Once | RESPIRATORY_TRACT | Status: AC
  Administered 2012-05-03: 5 mg via RESPIRATORY_TRACT
  Filled 2012-05-03: qty 1

## 2012-05-03 MED ORDER — AZITHROMYCIN 250 MG PO TABS: 250.0000 mg | ORAL_TABLET | Freq: Every day | ORAL | Status: DC

## 2012-05-03 MED ORDER — ALPRAZOLAM 0.25 MG PO TABS
1.0000 mg | ORAL_TABLET | Freq: Once | ORAL | Status: AC
  Administered 2012-05-03: 1 mg via ORAL
  Filled 2012-05-03: qty 4

## 2012-05-03 MED ORDER — IPRATROPIUM BROMIDE 0.02 % IN SOLN
0.5000 mg | Freq: Once | RESPIRATORY_TRACT | Status: AC
  Administered 2012-05-03: 0.5 mg via RESPIRATORY_TRACT
  Filled 2012-05-03: qty 2.5

## 2012-05-03 MED ORDER — PREDNISONE 20 MG PO TABS
60.0000 mg | ORAL_TABLET | Freq: Once | ORAL | Status: AC
  Administered 2012-05-03: 60 mg via ORAL
  Filled 2012-05-03: qty 3

## 2012-05-03 NOTE — Telephone Encounter (Signed)
Patient calls stating that she is having cough - whitish phlegm, sneezing, runny nose, watery eyes , headaches, chest hurting when she cough along with some wheezing for last 2 days Denies any fever.  She is currently denies any SOB but has been SOB occasional. She just took her claritin and albuterol inhaler that helped her a little bit.  I think she most likely has sinusitis and probably could be having COPD exac.  Plan: Advised her to repeat her albuterol inhaler in 4 hours. Come to the ER if she has difficulty catching her breath or wheezing gets worse or starts having fevers , else should definitely come to the clinic tomorrow morning for an evaluation.    Thanks, [employer]

## 2012-05-03 NOTE — ED Provider Notes (Signed)
History     CSN: [id number]  Arrival date & time 05/03/12  1660   First MD Initiated Contact with Patient 05/03/12 1024      Chief Complaint  Patient presents with  . Shortness of Breath    (Consider location/radiation/quality/duration/timing/severity/associated sxs/prior treatment) HPI Comments: Patient with a history of COPD on chronic oxygen presents today with a chief complaint of SOB, wheezing, chest tightness, and productive cough for the past 3-4 days.  She reports that she has been coughing up yellowish/greenish color phlegm.  She states that she has been taking her inhalers at home without relief.  She is also complaining of some nasal congestion and sinus pressure.  She denies fever or chills.  Denies chest pain.  She currently smokes.    Patient is a 62 y.o. female presenting with cough. The history is provided by the patient.  Cough Onset quality:  Gradual Timing:  Constant Progression:  Worsening Context: not sick contacts   Relieved by:  Nothing Ineffective treatments:  Beta-agonist inhaler Associated symptoms: myalgias, rhinorrhea, shortness of breath, sinus congestion and wheezing   Associated symptoms: no chest pain, no chills, no ear fullness, no fever and no sore throat     Past Medical History  Diagnosis Date  . Depression   . GERD (gastroesophageal reflux disease)     Followed by [name] // small bowel capsule endoscopy (11/2009) - minimal chronic inflammation, otherwise negative for  H.pylori, metaplasia, dysplasia  . Hypertension   . Hyperlipidemia   . Diabetes mellitus type II, uncontrolled     on insulin therapy  . COPD (chronic obstructive pulmonary disease) 2008    started home O2 (12/2010) // Prior significant smoker  // her CT of the chest there was no evidence of primary carcinoid tumor , scattered normal-sized mediastinal and hilar as well as axillary lymph nodes noted., no significant lymphadenopathy. COPD and emphysema with no acute  cardiopulmonary disease noted , mild cardiomegaly and solitary calcified  plaque  . Moderate to severe pulmonary hypertension     severe per TEE (08/2010) - Peak RV-RA gradient 23m Hg  . Mitral regurgitation     mild to moderate per TEE (08/2010)  . Shingles   . Anemia     Anemia panel (08/2009) - iron 21, TIBC 441, UIBC 420,  ferritin 11 // Pt has been unable to tolerate iron supplementation due to chronic abdominal discomfort, source of which still is not clear. // Repeat EGD (05/2011) negative, colonoscopy (05/2011) showing only int hemorrhoids, no specific cause of anemia  . History of colonic polyps     Colonoscopy (10/2005) - colon polyp with path nonadenomatous, no malignancy - by Dr. [name] // Colonoscopy (02/2002) - 2 polyps noted, 1 - adenomatous, 2 - hyperplastic // Followed by Dr. [name]// Repeat Colonoscopy (05/2011) - single tubular adenoma polyp - performed by Dr. [name] . Anxiety   . IBS (irritable bowel syndrome)   . Osteoporosis     DEXA (12/2004) - L-spine T score -2.6, Left hip -0.1  . Fibromyalgia   . Obstructive sleep apnea     on CPAP // Sleep study (06/2009) - Moderate sleep apnea/ hypopnea syndrome , AHI 17.8 per hour with nonpositional hypopneas. CPAP titration to 12 CWP, AHI 2.4 per hour. Small resMed Quattro full-face mask with heated humidifier  . Restless leg syndrome   . Mitral stenosis     moderate per TEE (08/2010) // Mitral valve replacement with a 27-mm pericardial porcine valve (Medtronic Mosaic  valve, serial #[id number]) on 09/20/2010 - [name] // Followed at [facility], Dr. Stanford Breed  . Bilateral carotid artery stenosis     s/p right endarterectomy (06/2010). // Carotid US (07/2010) -  60%-79% ICA stenosis, mid range of scale by velocity.  Left: Moderate-to-severe calcific and non-calcific plaque origin andproximal ICA and ECA.  60%-79% ICA stenosis, highest end of scale // Followed by [name].  . Peripheral vascular disease   .  Internal hemorrhoids     small internal hemorrhoids noted on colonoscopy 05/2011  . Clear cell renal cell carcinoma 07/21/2011    S/P cryoablation of left RCC in 09/2011 by [name] // Historically - MRI Abdomen (07/2011) - 2.7 x 2.7 x 3.0 cm enhancing solid renal mass posterior aspect of the lower pole of the left kidney. // CT Abdomen in 09/2010 showed no evidence of mass // Followed by [name]  [facility])   . Diastolic CHF     last echo (12/2010) showing grade 2 diastolic dysfunction. Otherwise, normal EF of 55-65%  . H/O mitral valve replacement 09/30/2011  . A-fib 10/22/2010    S/P Left atrial maze procedure for paroxysmal atrial fibrillation. 09/20/2010 Dr Prescott Gum     Past Surgical History  Procedure Laterality Date  . Orif clavicle fracture  01/2004    by Thana Farr. [name], M.D for Right clavicle nonunion.  . Tonsillectomy    . Carotid endarterectomy  07/04/2010    right, by [name] for asymptomatic right carotid artery stenosis  . Lipoma exision  08/2005    occipital lipoma 1.5cm - by Dr. [name]  . Hysteroscopy with endometrial ablation  06/2001    for persistent post-menopausal bleeding // by S. [name], M.D.  . Mitral valve replacement  09/20/10     with a 27-mm pericardial porcine valve (Medtronic Mosaic valve, serial #[id number]). 09/20/10, Dr Prescott Gum  . Left atrial maze procedure  09/20/10    for paroxysmal atrial fibrillation (Dr. Prescott Gum)  . Placement of left chest tube.  09/24/2010    Dr Prescott Gum  . Pr vein bypass graft,aorto-fem-pop  May 2012  . Cardiac valve replacement  Aug. 2012  . Colonoscopy  05/12/2011    performed by Dr. Michail Sermon. Showing small internal hemorrhoids, single tubular adenoma polyp  . Esophagogastroduodenoscopy  05/12/2011    performed by Dr. Michail Sermon. Negative for ulcerations, biopsy negative for evidence of celiac sprue  . Tubal ligation      Family History  Problem Relation Age of Onset  . Heart disease Father 34    died  to MI at 75yo  . Heart attack Father   . Heart disease Brother 57    died of MI at 55yo  . Heart attack Brother     History  Substance Use Topics  . Smoking status: Current Every Day Smoker -- 1.00 packs/day for 50 years    Types: Cigarettes    Last Attempt to Quit: 07/12/2010  . Smokeless tobacco: Never Used     Comment: quit 02/01/2012  . Alcohol Use: No    OB History   Grav Para Term Preterm Abortions TAB SAB Ect Mult Living                  Review of Systems  Constitutional: Negative for fever and chills.  HENT: Positive for congestion, rhinorrhea and sinus pressure. Negative for sore throat.   Respiratory: Positive for cough, chest tightness, shortness of breath and wheezing.   Cardiovascular:  Negative for chest pain.  Musculoskeletal: Positive for myalgias.  All other systems reviewed and are negative.    Allergies  Lorazepam; Morphine; Oxycontin; and Tramadol hcl  Home Medications   Current Outpatient Rx  Name  Route  Sig  Dispense  Refill  . ACCU-CHEK FASTCLIX LANCETS MISC   Does not apply   1 each by Does not apply route 3 (three) times daily before meals. Check blood sugars 3 times daily before breakfast, lunch, and dinner DX code: 250.02   102 each   12   . albuterol (PROVENTIL HFA;VENTOLIN HFA) 108 (90 BASE) MCG/ACT inhaler   Inhalation   Inhale 2 puffs into the lungs every 6 (six) hours as needed. For wheeze or shortness of breath   3 Inhaler   3   . ALPRAZolam (XANAX) 1 MG tablet   Oral   Take 1 tablet (1 mg total) by mouth 3 (three) times daily as needed for sleep or anxiety.   90 tablet   5   . aspirin 81 MG tablet   Oral   Take 81 mg by mouth daily with supper.          Marland Kitchen buPROPion (WELLBUTRIN XL) 150 MG 24 hr tablet   Oral   Take 1 tablet (150 mg total) by mouth daily with supper.   90 tablet   3   . docusate sodium (COLACE) 50 MG capsule   Oral   Take 100 mg by mouth every morning. Alternate with  senokot         .  DULoxetine (CYMBALTA) 30 MG capsule      daily with breakfast. Take 3 tablets by mouth once daily         . Elastic Bandages & Supports (TRUFORM STOCKINGS 20-30MMHG) MISC      Wear compression stockings throughout the day, and take off at nighttime.   2 each   1   . ferrous gluconate (FERGON) 324 MG tablet   Oral   Take 1 tablet (324 mg total) by mouth 2 (two) times daily with a meal.   120 tablet   3   . Fluticasone-Salmeterol (ADVAIR DISKUS) 250-50 MCG/DOSE AEPB   Inhalation   Inhale 1 puff into the lungs every 12 (twelve) hours.   3 each   3   . furosemide (LASIX) 80 MG tablet   Oral   Take 80 mg by mouth 2 (two) times daily.         Marland Kitchen gabapentin (NEURONTIN) 300 MG capsule      TAKE 1 CAPSULE BY MOUTH TWICE DAILY   60 capsule   PRN   . glucose blood (BAYER CONTOUR TEST) test strip      Use to test blood sugar 3 times daily.Dx code 250.00   100 each   12   . HYDROcodone-acetaminophen (NORCO) 5-325 MG per tablet      1-2 tablets every 6 hours as needed for pain. Do not take with alcohol, do no drive or operate heavy machinery if drowsiness occurs. Do not take more than instructed.   180 tablet   0     TO BE FILLED AFTER 05/03/2012   . insulin aspart (NOVOLOG) 100 UNIT/ML injection   Subcutaneous   Inject 13-21 Units into the skin See admin instructions. Patient takes 13 units three times a day and adds on to that based on her blood sugar         . insulin detemir (LEVEMIR) 100 UNIT/ML injection  Subcutaneous   Inject 60 Units into the skin at bedtime.   20 mL   5   . Insulin Pen Needle (B-D UF III MINI PEN NEEDLES) 31G X 5 MM MISC      DX: 250.02 Please check blood sugars 3 times daily.   100 each   11   . Insulin Syringe-Needle U-100 (INSULIN SYRINGE .3CC/31GX5/16") 31G X 5/16" 0.3 ML MISC   Does not apply   1 Syringe by Does not apply route 2 (two) times daily at 10 AM and 5 PM.   100 each   11   . lisinopril (PRINIVIL,ZESTRIL) 10 MG  tablet   Oral   Take 1 tablet (10 mg total) by mouth daily.   30 tablet   3   . loratadine (CLARITIN) 10 MG tablet   Oral   Take 10 mg by mouth daily with supper.         . nystatin (MYCOSTATIN) powder      Apply to affected area 3 times daily   15 g   0   . omeprazole (PRILOSEC) 40 MG capsule   Oral   Take 1 capsule (40 mg total) by mouth 2 (two) times daily.   60 capsule   5   . potassium chloride (KLOR-CON) 20 MEQ packet   Oral   Take 40 mEq by mouth 2 (two) times daily.         . rosuvastatin (CRESTOR) 20 MG tablet   Oral   Take 1 tablet (20 mg total) by mouth daily with supper.   30 tablet   5   . sennosides-docusate sodium (SENOKOT-S) 8.6-50 MG tablet   Oral   Take 2 tablets by mouth every other day. Hold for loose stools         . Spacer/Aero-Holding Chambers (AEROCHAMBER MV) inhaler      Use as instructed with inhalers.   1 each   0   . tiotropium (SPIRIVA) 18 MCG inhalation capsule   Inhalation   Place 1 capsule (18 mcg total) into inhaler and inhale daily with breakfast.   30 capsule   11     BP 123/60  Pulse 110  Temp(Src) 99.3 F (37.4 C) (Oral)  Resp 22  SpO2 97%  Physical Exam  Nursing note and vitals reviewed. Constitutional: She appears well-developed and well-nourished.  HENT:  Head: Normocephalic and atraumatic.  Right Ear: Tympanic membrane and ear canal normal.  Left Ear: Tympanic membrane and ear canal normal.  Nose: Mucosal edema and rhinorrhea present. Right sinus exhibits maxillary sinus tenderness and frontal sinus tenderness. Left sinus exhibits maxillary sinus tenderness and frontal sinus tenderness.  Mouth/Throat: Uvula is midline, oropharynx is clear and moist and mucous membranes are normal.  Neck: Normal range of motion. Neck supple.  Cardiovascular: Normal rate, regular rhythm and normal heart sounds.   Pulmonary/Chest: Tachypnea noted. She has decreased breath sounds. She has wheezes. She has rhonchi.  Patient  able to speak in complete sentences  Neurological: She is alert.  Skin: Skin is warm and dry. She is not diaphoretic.  Psychiatric: She has a normal mood and affect.    ED Course  Procedures (including critical care time)  Labs Reviewed  CBC WITH DIFFERENTIAL - Abnormal; Notable for the following:    Monocytes Relative 19 (*)    Monocytes Absolute 1.4 (*)    All other components within normal limits  COMPREHENSIVE METABOLIC PANEL  URINALYSIS, ROUTINE W REFLEX MICROSCOPIC  POCT I-STAT TROPONIN  I   Dg Chest 2 View  05/03/2012  *RADIOLOGY REPORT*  Clinical Data: Worsening shortness of breath.  Runny nose and chest congestion.  CHEST - 2 VIEW  Comparison: 09/15/2011  Findings: Two views of the chest were obtained.  Plate and screw fixation of the right clavicle is again noted.  Stable elevation of the left hemidiaphragm.  The lungs are clear.  The patient has median sternotomy wires.  Heart size is stable and normal.  IMPRESSION: No acute chest findings.   Original Report Authenticated By: Markus Daft, M.D.      1. COPD exacerbation   2. Acute sinusitis      12:31 PM Reassessed patient after first breathing treatment.  She reports that her breathing has improved somewhat, but she continues to feel short of breath.  Will order another breathing treatment.  1:30 PM Reassessed patient after second breathing treatment.  She states that she is feeling much better at this time.  Breath sounds have improved.  Patient is now complaining of feeling jittery from the Albuterol.    3:00 PM Reassessed patient.  She reports that she is feeling better and would like to be discharged home.   Patient also evaluated by Dr. Ashok Cordia.  MDM  Patient with a history of COPD presenting with wheezing, productive cough, and SOB.  Patient with wheezing on initial exam.  Symptoms consistent with COPD exacerbation.  Symptoms improved after given Prednisone and breathing treatment.  No acute findings on CXR.   Patient discharged home with short course of Prednisone and Azithromycin to treat Acute Sinusitis.  Return precautions discussed.  Patient instructed to follow up with PCP.        Sherlyn Lees [place], PA-C 05/04/12 [place], PA-C 05/04/12 1505

## 2012-05-03 NOTE — ED Notes (Signed)
Meal tray ordered 

## 2012-05-03 NOTE — ED Notes (Signed)
Pt states she feels slightly better after the nebulizer tx.

## 2012-05-03 NOTE — ED Notes (Signed)
Onset 3 days ago productive cough yellow sputum and shortness of breath with chest pain general fatigue.

## 2012-05-03 NOTE — ED Notes (Signed)
Pt placed on 2L/min [place] for per home O2

## 2012-05-03 NOTE — ED Notes (Signed)
Pt eating meal tray.

## 2012-05-03 NOTE — Telephone Encounter (Signed)
Thank you, agree. 

## 2012-05-03 NOTE — ED Notes (Signed)
Discharge and follow up instructions reviewed. Pt verbalized understanding.

## 2012-05-03 NOTE — Telephone Encounter (Signed)
Pt states she was to call for appt but is very short of breath, tight cough, feels generally bad, is frightened. She is ask to conserve her breath and call 911, she states her sister is en route and will bring her, volunteered to stay on phone with her but she states she will be ok and is leaving for hosp in few mins.

## 2012-05-04 ENCOUNTER — Encounter (HOSPITAL_COMMUNITY): Payer: Self-pay | Admitting: Emergency Medicine

## 2012-05-04 ENCOUNTER — Emergency Department (HOSPITAL_COMMUNITY): Payer: Medicare Other

## 2012-05-04 ENCOUNTER — Encounter (HOSPITAL_COMMUNITY): Payer: Medicare Other

## 2012-05-04 ENCOUNTER — Inpatient Hospital Stay (HOSPITAL_COMMUNITY)
Admission: EM | Admit: 2012-05-04 | Discharge: 2012-05-07 | DRG: 189 | Disposition: A | Discharge status: Home Health | Payer: Medicare Other | Attending: Internal Medicine | Admitting: Internal Medicine

## 2012-05-04 DIAGNOSIS — E669 Obesity, unspecified: Secondary | ICD-10-CM

## 2012-05-04 DIAGNOSIS — I4891 Unspecified atrial fibrillation: Secondary | ICD-10-CM | POA: Diagnosis present

## 2012-05-04 DIAGNOSIS — I5032 Chronic diastolic (congestive) heart failure: Secondary | ICD-10-CM | POA: Diagnosis present

## 2012-05-04 DIAGNOSIS — R296 Repeated falls: Secondary | ICD-10-CM

## 2012-05-04 DIAGNOSIS — IMO0001 Reserved for inherently not codable concepts without codable children: Secondary | ICD-10-CM | POA: Diagnosis present

## 2012-05-04 DIAGNOSIS — F3289 Other specified depressive episodes: Secondary | ICD-10-CM | POA: Diagnosis present

## 2012-05-04 DIAGNOSIS — K589 Irritable bowel syndrome without diarrhea: Secondary | ICD-10-CM | POA: Diagnosis present

## 2012-05-04 DIAGNOSIS — D509 Iron deficiency anemia, unspecified: Secondary | ICD-10-CM | POA: Diagnosis present

## 2012-05-04 DIAGNOSIS — E118 Type 2 diabetes mellitus with unspecified complications: Secondary | ICD-10-CM | POA: Diagnosis present

## 2012-05-04 DIAGNOSIS — I10 Essential (primary) hypertension: Secondary | ICD-10-CM | POA: Diagnosis present

## 2012-05-04 DIAGNOSIS — E1169 Type 2 diabetes mellitus with other specified complication: Secondary | ICD-10-CM | POA: Diagnosis present

## 2012-05-04 DIAGNOSIS — J4489 Other specified chronic obstructive pulmonary disease: Secondary | ICD-10-CM

## 2012-05-04 DIAGNOSIS — J9601 Acute respiratory failure with hypoxia: Secondary | ICD-10-CM | POA: Diagnosis present

## 2012-05-04 DIAGNOSIS — J441 Chronic obstructive pulmonary disease with (acute) exacerbation: Secondary | ICD-10-CM | POA: Diagnosis present

## 2012-05-04 DIAGNOSIS — Z79899 Other long term (current) drug therapy: Secondary | ICD-10-CM

## 2012-05-04 DIAGNOSIS — E119 Type 2 diabetes mellitus without complications: Secondary | ICD-10-CM | POA: Diagnosis present

## 2012-05-04 DIAGNOSIS — Z952 Presence of prosthetic heart valve: Secondary | ICD-10-CM

## 2012-05-04 DIAGNOSIS — M810 Age-related osteoporosis without current pathological fracture: Secondary | ICD-10-CM | POA: Diagnosis present

## 2012-05-04 DIAGNOSIS — F329 Major depressive disorder, single episode, unspecified: Secondary | ICD-10-CM | POA: Diagnosis present

## 2012-05-04 DIAGNOSIS — I2789 Other specified pulmonary heart diseases: Secondary | ICD-10-CM | POA: Diagnosis present

## 2012-05-04 DIAGNOSIS — F17200 Nicotine dependence, unspecified, uncomplicated: Secondary | ICD-10-CM | POA: Diagnosis present

## 2012-05-04 DIAGNOSIS — E785 Hyperlipidemia, unspecified: Secondary | ICD-10-CM | POA: Diagnosis present

## 2012-05-04 DIAGNOSIS — I739 Peripheral vascular disease, unspecified: Secondary | ICD-10-CM | POA: Diagnosis present

## 2012-05-04 DIAGNOSIS — E1165 Type 2 diabetes mellitus with hyperglycemia: Secondary | ICD-10-CM

## 2012-05-04 DIAGNOSIS — F411 Generalized anxiety disorder: Secondary | ICD-10-CM | POA: Diagnosis present

## 2012-05-04 DIAGNOSIS — J9600 Acute respiratory failure, unspecified whether with hypoxia or hypercapnia: Secondary | ICD-10-CM | POA: Diagnosis present

## 2012-05-04 DIAGNOSIS — J439 Emphysema, unspecified: Secondary | ICD-10-CM | POA: Diagnosis present

## 2012-05-04 DIAGNOSIS — I272 Pulmonary hypertension, unspecified: Secondary | ICD-10-CM

## 2012-05-04 DIAGNOSIS — G4733 Obstructive sleep apnea (adult) (pediatric): Secondary | ICD-10-CM

## 2012-05-04 DIAGNOSIS — I6529 Occlusion and stenosis of unspecified carotid artery: Secondary | ICD-10-CM | POA: Diagnosis present

## 2012-05-04 DIAGNOSIS — Z85528 Personal history of other malignant neoplasm of kidney: Secondary | ICD-10-CM

## 2012-05-04 DIAGNOSIS — I679 Cerebrovascular disease, unspecified: Secondary | ICD-10-CM

## 2012-05-04 DIAGNOSIS — I050 Rheumatic mitral stenosis: Secondary | ICD-10-CM | POA: Diagnosis present

## 2012-05-04 DIAGNOSIS — J9620 Acute and chronic respiratory failure, unspecified whether with hypoxia or hypercapnia: Principal | ICD-10-CM | POA: Diagnosis present

## 2012-05-04 DIAGNOSIS — Z794 Long term (current) use of insulin: Secondary | ICD-10-CM

## 2012-05-04 DIAGNOSIS — IMO0002 Reserved for concepts with insufficient information to code with codable children: Secondary | ICD-10-CM

## 2012-05-04 DIAGNOSIS — I509 Heart failure, unspecified: Secondary | ICD-10-CM | POA: Diagnosis present

## 2012-05-04 DIAGNOSIS — G2581 Restless legs syndrome: Secondary | ICD-10-CM | POA: Diagnosis present

## 2012-05-04 DIAGNOSIS — I658 Occlusion and stenosis of other precerebral arteries: Secondary | ICD-10-CM | POA: Diagnosis present

## 2012-05-04 DIAGNOSIS — K219 Gastro-esophageal reflux disease without esophagitis: Secondary | ICD-10-CM | POA: Diagnosis present

## 2012-05-04 DIAGNOSIS — Z9181 History of falling: Secondary | ICD-10-CM

## 2012-05-04 DIAGNOSIS — I059 Rheumatic mitral valve disease, unspecified: Secondary | ICD-10-CM | POA: Diagnosis present

## 2012-05-04 DIAGNOSIS — E1140 Type 2 diabetes mellitus with diabetic neuropathy, unspecified: Secondary | ICD-10-CM | POA: Diagnosis present

## 2012-05-04 DIAGNOSIS — I517 Cardiomegaly: Secondary | ICD-10-CM

## 2012-05-04 DIAGNOSIS — J449 Chronic obstructive pulmonary disease, unspecified: Secondary | ICD-10-CM

## 2012-05-04 LAB — COMPREHENSIVE METABOLIC PANEL
ALT: 17 U/L (ref 0–35)
AST: 18 U/L (ref 0–37)
Albumin: 3.7 g/dL (ref 3.5–5.2)
Alkaline Phosphatase: 58 U/L (ref 39–117)
BUN: 29 mg/dL — ABNORMAL HIGH (ref 6–23)
CO2: 27 mEq/L (ref 19–32)
Calcium: 9.1 mg/dL (ref 8.4–10.5)
Chloride: 98 mEq/L (ref 96–112)
Creatinine, Ser: 1.12 mg/dL — ABNORMAL HIGH (ref 0.50–1.10)
GFR calc Af Amer: 59 mL/min — ABNORMAL LOW (ref >90)
GFR calc non Af Amer: 51 mL/min — ABNORMAL LOW (ref >90)
Glucose, Bld: 287 mg/dL — ABNORMAL HIGH (ref 70–99)
Potassium: 4.1 mEq/L (ref 3.5–5.1)
Sodium: 137 mEq/L (ref 135–145)
Total Bilirubin: 0.2 mg/dL — ABNORMAL LOW (ref 0.3–1.2)
Total Protein: 7.1 g/dL (ref 6.0–8.3)

## 2012-05-04 LAB — CBC
HCT: 37.9 % (ref 36.0–46.0)
Hemoglobin: 13 g/dL (ref 12.0–15.0)
MCH: 31.6 pg (ref 26.0–34.0)
MCHC: 34.3 g/dL (ref 30.0–36.0)
MCV: 92 fL (ref 78.0–100.0)
Platelets: 166 10*3/uL (ref 150–400)
RBC: 4.12 MIL/uL (ref 3.87–5.11)
RDW: 14.4 % (ref 11.5–15.5)
WBC: 8.4 10*3/uL (ref 4.0–10.5)

## 2012-05-04 LAB — POCT I-STAT 3, ART BLOOD GAS (G3+)
Acid-Base Excess: 3 mmol/L — ABNORMAL HIGH (ref 0.0–2.0)
Bicarbonate: 27.6 mEq/L — ABNORMAL HIGH (ref 20.0–24.0)
O2 Saturation: 99 %
Patient temperature: 98.6
TCO2: 29 mmol/L (ref 0–100)
pCO2 arterial: 40.7 mmHg (ref 35.0–45.0)
pH, Arterial: 7.439 (ref 7.350–7.450)
pO2, Arterial: 148 mmHg — ABNORMAL HIGH (ref 80.0–100.0)

## 2012-05-04 LAB — GLUCOSE, CAPILLARY
Glucose-Capillary: 165 mg/dL — ABNORMAL HIGH (ref 70–99)
Glucose-Capillary: 237 mg/dL — ABNORMAL HIGH (ref 70–99)
Glucose-Capillary: 256 mg/dL — ABNORMAL HIGH (ref 70–99)
Glucose-Capillary: 287 mg/dL — ABNORMAL HIGH (ref 70–99)
Glucose-Capillary: 294 mg/dL — ABNORMAL HIGH (ref 70–99)
Glucose-Capillary: 363 mg/dL — ABNORMAL HIGH (ref 70–99)
Glucose-Capillary: 379 mg/dL — ABNORMAL HIGH (ref 70–99)
Glucose-Capillary: 423 mg/dL — ABNORMAL HIGH (ref 70–99)
Glucose-Capillary: 426 mg/dL — ABNORMAL HIGH (ref 70–99)
Glucose-Capillary: 426 mg/dL — ABNORMAL HIGH (ref 70–99)
Glucose-Capillary: 479 mg/dL — ABNORMAL HIGH (ref 70–99)
Glucose-Capillary: 490 mg/dL — ABNORMAL HIGH (ref 70–99)

## 2012-05-04 LAB — URINALYSIS, ROUTINE W REFLEX MICROSCOPIC
Bilirubin Urine: NEGATIVE
Glucose, UA: >1000 mg/dL — AB
Ketones, ur: NEGATIVE mg/dL
Leukocytes, UA: NEGATIVE
Nitrite: NEGATIVE
Protein, ur: NEGATIVE mg/dL
Specific Gravity, Urine: 1.016 (ref 1.005–1.030)
Urobilinogen, UA: 0.2 mg/dL (ref 0.0–1.0)
pH: 5 (ref 5.0–8.0)

## 2012-05-04 LAB — POCT I-STAT, CHEM 8
BUN: 32 mg/dL — ABNORMAL HIGH (ref 6–23)
Calcium, Ion: 1.2 mmol/L (ref 1.13–1.30)
Chloride: 100 mEq/L (ref 96–112)
Creatinine, Ser: 1.2 mg/dL — ABNORMAL HIGH (ref 0.50–1.10)
Glucose, Bld: 288 mg/dL — ABNORMAL HIGH (ref 70–99)
HCT: 41 % (ref 36.0–46.0)
Hemoglobin: 13.9 g/dL (ref 12.0–15.0)
Potassium: 3.7 mEq/L (ref 3.5–5.1)
Sodium: 139 mEq/L (ref 135–145)
TCO2: 27 mmol/L (ref 0–100)

## 2012-05-04 LAB — CBC WITH DIFFERENTIAL/PLATELET
Basophils Absolute: 0 10*3/uL (ref 0.0–0.1)
Basophils Relative: 0 % (ref 0–1)
Eosinophils Absolute: 0 10*3/uL (ref 0.0–0.7)
Eosinophils Relative: 0 % (ref 0–5)
HCT: 38.6 % (ref 36.0–46.0)
Hemoglobin: 13.2 g/dL (ref 12.0–15.0)
Lymphocytes Relative: 13 % (ref 12–46)
Lymphs Abs: 1 10*3/uL (ref 0.7–4.0)
MCH: 31.4 pg (ref 26.0–34.0)
MCHC: 34.2 g/dL (ref 30.0–36.0)
MCV: 91.9 fL (ref 78.0–100.0)
Monocytes Absolute: 1.1 10*3/uL — ABNORMAL HIGH (ref 0.1–1.0)
Monocytes Relative: 14 % — ABNORMAL HIGH (ref 3–12)
Neutro Abs: 5.6 10*3/uL (ref 1.7–7.7)
Neutrophils Relative %: 72 % (ref 43–77)
Platelets: 175 10*3/uL (ref 150–400)
RBC: 4.2 MIL/uL (ref 3.87–5.11)
RDW: 14.4 % (ref 11.5–15.5)
WBC: 7.7 10*3/uL (ref 4.0–10.5)

## 2012-05-04 LAB — EXPECTORATED SPUTUM ASSESSMENT W GRAM STAIN, RFLX TO RESP C

## 2012-05-04 LAB — MRSA PCR SCREENING: MRSA by PCR: NEGATIVE

## 2012-05-04 LAB — POCT I-STAT 3, VENOUS BLOOD GAS (G3P V)
Acid-Base Excess: 1 mmol/L (ref 0.0–2.0)
Bicarbonate: 28.6 mEq/L — ABNORMAL HIGH (ref 20.0–24.0)
O2 Saturation: 37 %
TCO2: 30 mmol/L (ref 0–100)
pCO2, Ven: 55.5 mmHg — ABNORMAL HIGH (ref 45.0–50.0)
pH, Ven: 7.32 — ABNORMAL HIGH (ref 7.250–7.300)
pO2, Ven: 24 mmHg — CL (ref 30.0–45.0)

## 2012-05-04 LAB — URINE MICROSCOPIC-ADD ON

## 2012-05-04 LAB — HEMOGLOBIN A1C
Hgb A1c MFr Bld: 8.4 % — ABNORMAL HIGH (ref <5.7)
Mean Plasma Glucose: 194 mg/dL — ABNORMAL HIGH (ref <117)

## 2012-05-04 LAB — PRO B NATRIURETIC PEPTIDE: Pro B Natriuretic peptide (BNP): 1219 pg/mL — ABNORMAL HIGH (ref 0–125)

## 2012-05-04 LAB — TROPONIN I: Troponin I: <0.3 ng/mL (ref <0.30)

## 2012-05-04 MED ORDER — DOXYCYCLINE HYCLATE 100 MG IV SOLR: 100.0000 mg | Freq: Two times a day (BID) | INTRAVENOUS | Status: DC

## 2012-05-04 MED ORDER — FUROSEMIDE 10 MG/ML IJ SOLN
20.0000 mg | Freq: Once | INTRAMUSCULAR | Status: AC
  Administered 2012-05-04: 20 mg via INTRAVENOUS
  Filled 2012-05-04: qty 2

## 2012-05-04 MED ORDER — PANTOPRAZOLE SODIUM 40 MG PO TBEC
40.0000 mg | DELAYED_RELEASE_TABLET | Freq: Every day | ORAL | Status: DC
  Administered 2012-05-04 – 2012-05-07 (×4): 40 mg via ORAL
  Filled 2012-05-04 (×4): qty 1

## 2012-05-04 MED ORDER — ALPRAZOLAM 0.25 MG PO TABS
1.0000 mg | ORAL_TABLET | Freq: Once | ORAL | Status: AC
  Administered 2012-05-04: 1 mg via ORAL
  Filled 2012-05-04: qty 4

## 2012-05-04 MED ORDER — DOCUSATE SODIUM 100 MG PO CAPS
100.0000 mg | ORAL_CAPSULE | Freq: Every morning | ORAL | Status: DC
  Administered 2012-05-04 – 2012-05-07 (×4): 100 mg via ORAL
  Filled 2012-05-04 (×4): qty 1

## 2012-05-04 MED ORDER — ONDANSETRON HCL 4 MG PO TABS: 4.0000 mg | ORAL_TABLET | Freq: Four times a day (QID) | ORAL | Status: DC | PRN

## 2012-05-04 MED ORDER — SODIUM CHLORIDE 0.9 % IJ SOLN
3.0000 mL | INTRAMUSCULAR | Status: DC | PRN
  Administered 2012-05-04: 10 mL via INTRAVENOUS

## 2012-05-04 MED ORDER — CHLORHEXIDINE GLUCONATE 0.12 % MT SOLN
15.0000 mL | Freq: Two times a day (BID) | OROMUCOSAL | Status: DC
  Administered 2012-05-04 – 2012-05-07 (×6): 15 mL via OROMUCOSAL
  Filled 2012-05-04 (×10): qty 15

## 2012-05-04 MED ORDER — INSULIN GLARGINE 100 UNIT/ML ~~LOC~~ SOLN
10.0000 [IU] | SUBCUTANEOUS | Status: AC
  Filled 2012-05-04 (×2): qty 0.1

## 2012-05-04 MED ORDER — DULOXETINE HCL 60 MG PO CPEP
90.0000 mg | ORAL_CAPSULE | Freq: Every day | ORAL | Status: DC
  Administered 2012-05-04 – 2012-05-07 (×4): 90 mg via ORAL
  Filled 2012-05-04 (×6): qty 1

## 2012-05-04 MED ORDER — FERROUS GLUCONATE 324 (38 FE) MG PO TABS
324.0000 mg | ORAL_TABLET | Freq: Two times a day (BID) | ORAL | Status: DC
  Administered 2012-05-04 – 2012-05-07 (×7): 324 mg via ORAL
  Filled 2012-05-04 (×10): qty 1

## 2012-05-04 MED ORDER — CALCIUM CARBONATE 600 MG PO TABS: 1200.0000 mg | ORAL_TABLET | Freq: Every day | ORAL | Status: DC

## 2012-05-04 MED ORDER — LEVALBUTEROL HCL 0.63 MG/3ML IN NEBU
0.6300 mg | INHALATION_SOLUTION | RESPIRATORY_TRACT | Status: DC
  Administered 2012-05-04 – 2012-05-07 (×19): 0.63 mg via RESPIRATORY_TRACT
  Filled 2012-05-04 (×28): qty 3

## 2012-05-04 MED ORDER — INSULIN ASPART 100 UNIT/ML ~~LOC~~ SOLN
0.0000 [IU] | SUBCUTANEOUS | Status: DC
  Administered 2012-05-04: 5 [IU] via SUBCUTANEOUS
  Administered 2012-05-04: 9 [IU] via SUBCUTANEOUS

## 2012-05-04 MED ORDER — ALUM & MAG HYDROXIDE-SIMETH 200-200-20 MG/5ML PO SUSP
30.0000 mL | Freq: Four times a day (QID) | ORAL | Status: DC | PRN
  Filled 2012-05-04: qty 30

## 2012-05-04 MED ORDER — FUROSEMIDE 10 MG/ML IJ SOLN
40.0000 mg | Freq: Three times a day (TID) | INTRAMUSCULAR | Status: AC
  Administered 2012-05-04: 40 mg via INTRAVENOUS
  Filled 2012-05-04: qty 4

## 2012-05-04 MED ORDER — GABAPENTIN 300 MG PO CAPS
300.0000 mg | ORAL_CAPSULE | Freq: Two times a day (BID) | ORAL | Status: DC
  Administered 2012-05-04 – 2012-05-07 (×6): 300 mg via ORAL
  Filled 2012-05-04 (×8): qty 1

## 2012-05-04 MED ORDER — BIOTENE DRY MOUTH MT LIQD
15.0000 mL | Freq: Two times a day (BID) | OROMUCOSAL | Status: DC
  Administered 2012-05-04 – 2012-05-07 (×7): 15 mL via OROMUCOSAL

## 2012-05-04 MED ORDER — FENTANYL CITRATE 0.05 MG/ML IJ SOLN
100.0000 ug | Freq: Once | INTRAMUSCULAR | Status: AC
  Administered 2012-05-04: 100 ug via INTRAVENOUS
  Filled 2012-05-04: qty 2

## 2012-05-04 MED ORDER — ASPIRIN EC 81 MG PO TBEC
81.0000 mg | DELAYED_RELEASE_TABLET | Freq: Every evening | ORAL | Status: DC
Start: 2012-05-04 — End: 2012-05-07
  Administered 2012-05-04 – 2012-05-06 (×3): 81 mg via ORAL
  Filled 2012-05-04 (×4): qty 1

## 2012-05-04 MED ORDER — INSULIN ASPART 100 UNIT/ML ~~LOC~~ SOLN: 2.0000 [IU] | SUBCUTANEOUS | Status: DC

## 2012-05-04 MED ORDER — ACETAMINOPHEN 325 MG PO TABS
650.0000 mg | ORAL_TABLET | Freq: Four times a day (QID) | ORAL | Status: DC | PRN
  Administered 2012-05-04 – 2012-05-07 (×6): 650 mg via ORAL
  Filled 2012-05-04 (×6): qty 2

## 2012-05-04 MED ORDER — LORATADINE 10 MG PO TABS
10.0000 mg | ORAL_TABLET | Freq: Every day | ORAL | Status: DC
  Administered 2012-05-04 – 2012-05-06 (×3): 10 mg via ORAL
  Filled 2012-05-04 (×4): qty 1

## 2012-05-04 MED ORDER — POTASSIUM CHLORIDE CRYS ER 20 MEQ PO TBCR
20.0000 meq | EXTENDED_RELEASE_TABLET | Freq: Every day | ORAL | Status: DC
  Administered 2012-05-05: 20 meq via ORAL
  Filled 2012-05-04 (×2): qty 1

## 2012-05-04 MED ORDER — PNEUMOCOCCAL VAC POLYVALENT 25 MCG/0.5ML IJ INJ: 0.5000 mL | INJECTION | INTRAMUSCULAR | Status: DC | PRN

## 2012-05-04 MED ORDER — IPRATROPIUM BROMIDE 0.02 % IN SOLN
0.5000 mg | RESPIRATORY_TRACT | Status: DC
  Administered 2012-05-04 – 2012-05-07 (×19): 0.5 mg via RESPIRATORY_TRACT
  Filled 2012-05-04 (×19): qty 2.5

## 2012-05-04 MED ORDER — SODIUM CHLORIDE 0.9 % IJ SOLN
3.0000 mL | Freq: Two times a day (BID) | INTRAMUSCULAR | Status: DC
  Administered 2012-05-06: 3 mL via INTRAVENOUS

## 2012-05-04 MED ORDER — ONDANSETRON HCL 4 MG/2ML IJ SOLN: 4.0000 mg | Freq: Four times a day (QID) | INTRAMUSCULAR | Status: DC | PRN

## 2012-05-04 MED ORDER — DEXTROSE 5 % IV SOLN
100.0000 mg | INTRAVENOUS | Status: DC
  Filled 2012-05-04: qty 100

## 2012-05-04 MED ORDER — METHYLPREDNISOLONE SODIUM SUCC 125 MG IJ SOLR
125.0000 mg | Freq: Two times a day (BID) | INTRAMUSCULAR | Status: DC
  Administered 2012-05-04 (×2): 125 mg via INTRAVENOUS
  Filled 2012-05-04 (×4): qty 2

## 2012-05-04 MED ORDER — POTASSIUM CHLORIDE CRYS ER 20 MEQ PO TBCR
40.0000 meq | EXTENDED_RELEASE_TABLET | Freq: Every day | ORAL | Status: DC
  Administered 2012-05-04: 40 meq via ORAL
  Filled 2012-05-04 (×2): qty 2

## 2012-05-04 MED ORDER — HEPARIN SODIUM (PORCINE) 5000 UNIT/ML IJ SOLN
5000.0000 [IU] | Freq: Three times a day (TID) | INTRAMUSCULAR | Status: DC
  Administered 2012-05-04 – 2012-05-07 (×10): 5000 [IU] via SUBCUTANEOUS
  Filled 2012-05-04 (×13): qty 1

## 2012-05-04 MED ORDER — LEVOFLOXACIN IN D5W 750 MG/150ML IV SOLN
750.0000 mg | INTRAVENOUS | Status: DC
  Administered 2012-05-04 – 2012-05-05 (×3): 750 mg via INTRAVENOUS
  Filled 2012-05-04 (×5): qty 150

## 2012-05-04 MED ORDER — MIDAZOLAM HCL 2 MG/2ML IJ SOLN
INTRAMUSCULAR | Status: AC
  Filled 2012-05-04: qty 4

## 2012-05-04 MED ORDER — NYSTATIN 100000 UNIT/GM EX POWD: 1.0000 g | Freq: Every day | CUTANEOUS | Status: DC | PRN

## 2012-05-04 MED ORDER — ACETAMINOPHEN 650 MG RE SUPP: 650.0000 mg | Freq: Four times a day (QID) | RECTAL | Status: DC | PRN

## 2012-05-04 MED ORDER — IBUPROFEN 400 MG PO TABS
400.0000 mg | ORAL_TABLET | Freq: Once | ORAL | Status: DC
  Filled 2012-05-04: qty 1

## 2012-05-04 MED ORDER — POTASSIUM CHLORIDE CRYS ER 20 MEQ PO TBCR: 20.0000 meq | EXTENDED_RELEASE_TABLET | Freq: Two times a day (BID) | ORAL | Status: DC

## 2012-05-04 MED ORDER — METHYLPREDNISOLONE SODIUM SUCC 125 MG IJ SOLR
60.0000 mg | Freq: Four times a day (QID) | INTRAMUSCULAR | Status: DC
  Administered 2012-05-04 – 2012-05-06 (×7): 60 mg via INTRAVENOUS
  Filled 2012-05-04 (×11): qty 0.96

## 2012-05-04 MED ORDER — SODIUM CHLORIDE 0.9 % IV SOLN: 250.0000 mL | INTRAVENOUS | Status: DC | PRN

## 2012-05-04 MED ORDER — FUROSEMIDE 10 MG/ML IJ SOLN
INTRAMUSCULAR | Status: AC
  Administered 2012-05-04: 40 mg via INTRAVENOUS
  Filled 2012-05-04: qty 4

## 2012-05-04 MED ORDER — MOMETASONE FURO-FORMOTEROL FUM 100-5 MCG/ACT IN AERO
2.0000 | INHALATION_SPRAY | Freq: Two times a day (BID) | RESPIRATORY_TRACT | Status: DC
  Administered 2012-05-04 – 2012-05-07 (×6): 2 via RESPIRATORY_TRACT
  Filled 2012-05-04: qty 8.8

## 2012-05-04 MED ORDER — FUROSEMIDE 80 MG PO TABS
80.0000 mg | ORAL_TABLET | Freq: Two times a day (BID) | ORAL | Status: DC
  Administered 2012-05-04: 80 mg via ORAL
  Filled 2012-05-04 (×3): qty 1

## 2012-05-04 MED ORDER — CALCIUM CARBONATE 1250 (500 CA) MG PO TABS
1.0000 | ORAL_TABLET | Freq: Every day | ORAL | Status: DC
  Administered 2012-05-04 – 2012-05-07 (×4): 500 mg via ORAL
  Filled 2012-05-04 (×6): qty 1

## 2012-05-04 MED ORDER — ALBUTEROL SULFATE HFA 108 (90 BASE) MCG/ACT IN AERS: 2.0000 | INHALATION_SPRAY | Freq: Four times a day (QID) | RESPIRATORY_TRACT | Status: DC | PRN

## 2012-05-04 MED ORDER — INSULIN DETEMIR 100 UNIT/ML ~~LOC~~ SOLN
20.0000 [IU] | Freq: Every day | SUBCUTANEOUS | Status: DC
  Administered 2012-05-05: 20 [IU] via SUBCUTANEOUS
  Filled 2012-05-04: qty 0.2

## 2012-05-04 MED ORDER — BUPROPION HCL ER (XL) 150 MG PO TB24
150.0000 mg | ORAL_TABLET | Freq: Every day | ORAL | Status: DC
  Administered 2012-05-04 – 2012-05-06 (×3): 150 mg via ORAL
  Filled 2012-05-04 (×4): qty 1

## 2012-05-04 MED ORDER — ATORVASTATIN CALCIUM 10 MG PO TABS
10.0000 mg | ORAL_TABLET | Freq: Every day | ORAL | Status: DC
  Administered 2012-05-04 – 2012-05-06 (×3): 10 mg via ORAL
  Filled 2012-05-04 (×4): qty 1

## 2012-05-04 MED ORDER — SODIUM CHLORIDE 0.9 % IJ SOLN
3.0000 mL | Freq: Two times a day (BID) | INTRAMUSCULAR | Status: DC
  Administered 2012-05-07: 3 mL via INTRAVENOUS

## 2012-05-04 MED ORDER — NYSTATIN 100000 UNIT/GM EX POWD
Freq: Every day | CUTANEOUS | Status: DC | PRN
  Filled 2012-05-04: qty 15

## 2012-05-04 MED ORDER — FENTANYL CITRATE 0.05 MG/ML IJ SOLN
INTRAMUSCULAR | Status: AC
  Filled 2012-05-04: qty 2

## 2012-05-04 MED ORDER — ALPRAZOLAM 0.5 MG PO TABS
1.0000 mg | ORAL_TABLET | Freq: Three times a day (TID) | ORAL | Status: DC | PRN
  Administered 2012-05-04: 0.5 mg via ORAL
  Administered 2012-05-04 – 2012-05-05 (×3): 1 mg via ORAL
  Filled 2012-05-04: qty 1
  Filled 2012-05-04 (×3): qty 2

## 2012-05-04 MED ORDER — SODIUM CHLORIDE 0.9 % IV SOLN
INTRAVENOUS | Status: DC
  Administered 2012-05-04: 6.8 [IU]/h via INTRAVENOUS
  Administered 2012-05-04: 16:00:00 via INTRAVENOUS
  Administered 2012-05-05: 4.9 [IU]/h via INTRAVENOUS
  Administered 2012-05-05: 9 [IU]/h via INTRAVENOUS
  Filled 2012-05-04 (×2): qty 1

## 2012-05-04 MED ORDER — POTASSIUM CHLORIDE CRYS ER 20 MEQ PO TBCR: 40.0000 meq | EXTENDED_RELEASE_TABLET | Freq: Once | ORAL | Status: DC

## 2012-05-04 NOTE — ED Notes (Signed)
Venous blood gas completed by phlebotomy.

## 2012-05-04 NOTE — ED Notes (Signed)
Resp called for bipap per MD request.

## 2012-05-04 NOTE — ED Notes (Signed)
Resp at bedside to place bipap on patient.

## 2012-05-04 NOTE — H&P (Signed)
Internal Medicine Teaching Service Attending Note Date: 05/04/2012  Patient name: [name]  Medical record number: [id number]  Date of birth: 1950/09/21   I have seen and evaluated [name] and discussed their care with the Residency Team.    [name] is a 62yo woman with a complicated PMH including COPD, diastolic CHF and pulmonary HTN who presents with SOB, chest tightness, dyspnea and productive cough.  When I saw her, she had become very somnolent on BIPAP, she was able to answer simple yes and no questions.  The following history is mostly obtained via chart review and discussion with the resident team. She was previously seen in the ED on the day prior to admission where she received a breathing treatment and steroids and Rx for a zpack and steroids, but these were not filled.  In the am of admission, her symptoms worsened and she presented to the ED.  She was attempting her inhalers without success in improving her symptoms.  Further associated symptoms included sinus pressure and nasal congestion.   For further history, please see resident note.   When I saw her, she was saturating well on Bipap, but was very somnolent and only able to answer yes to the questions about being tired and SOB.  PCCM is following.   Physical Exam: Blood pressure 124/56, pulse 92, temperature 99.2 F (37.3 C), temperature source Axillary, resp. rate 20, weight 184 lb 8.4 oz (83.7 kg), SpO2 94.00%. General appearance: appears stated age, fatigued, moderately obese and somnolent Head: Normocephalic, without obvious abnormality, atraumatic Eyes: anicteric sclerae Lungs: clear to auscultation bilaterally and bipap in place, difficult to appreciate wheezing 2/2 noise of bipap Heart: mild tachycardia, otherwise normal rhythm Abdomen: soft, NT, +BS Extremities: trace edema to bilateral LE Pulses: 2+ and symmetric  Lab results: Results for orders placed during the hospital encounter of 05/04/12  (from the past 24 hour(s))  CBC WITH DIFFERENTIAL     Status: Abnormal   Collection Time    05/04/12 12:21 AM      Result Value Range   WBC 7.7  4.0 - 10.5 K/uL   RBC 4.20  3.87 - 5.11 MIL/uL   Hemoglobin 13.2  12.0 - 15.0 g/dL   HCT 38.6  36.0 - 46.0 %   MCV 91.9  78.0 - 100.0 fL   MCH 31.4  26.0 - 34.0 pg   MCHC 34.2  30.0 - 36.0 g/dL   RDW 14.4  11.5 - 15.5 %   Platelets 175  150 - 400 K/uL   Neutrophils Relative 72  43 - 77 %   Neutro Abs 5.6  1.7 - 7.7 K/uL   Lymphocytes Relative 13  12 - 46 %   Lymphs Abs 1.0  0.7 - 4.0 K/uL   Monocytes Relative 14 (*) 3 - 12 %   Monocytes Absolute 1.1 (*) 0.1 - 1.0 K/uL   Eosinophils Relative 0  0 - 5 %   Eosinophils Absolute 0.0  0.0 - 0.7 K/uL   Basophils Relative 0  0 - 1 %   Basophils Absolute 0.0  0.0 - 0.1 K/uL  POCT I-STAT 3, BLOOD GAS (G3P V)     Status: Abnormal   Collection Time    05/04/12 12:34 AM      Result Value Range   pH, Ven 7.320 (*) 7.250 - 7.300   pCO2, Ven 55.5 (*) 45.0 - 50.0 mmHg   pO2, Ven 24.0 (*) 30.0 - 45.0 mmHg   Bicarbonate  28.6 (*) 20.0 - 24.0 mEq/L   TCO2 30  0 - 100 mmol/L   O2 Saturation 37.0     Acid-Base Excess 1.0  0.0 - 2.0 mmol/L   Sample type VENOUS    POCT I-STAT, CHEM 8     Status: Abnormal   Collection Time    05/04/12 12:35 AM      Result Value Range   Sodium 139  135 - 145 mEq/L   Potassium 3.7  3.5 - 5.1 mEq/L   Chloride 100  96 - 112 mEq/L   BUN 32 (*) 6 - 23 mg/dL   Creatinine, Ser 1.20 (*) 0.50 - 1.10 mg/dL   Glucose, Bld 288 (*) 70 - 99 mg/dL   Calcium, Ion 1.20  1.13 - 1.30 mmol/L   TCO2 27  0 - 100 mmol/L   Hemoglobin 13.9  12.0 - 15.0 g/dL   HCT 41.0  36.0 - 46.0 %  GLUCOSE, CAPILLARY     Status: Abnormal   Collection Time    05/04/12  4:12 AM      Result Value Range   Glucose-Capillary 294 (*) 70 - 99 mg/dL   Comment 1 Documented in Chart     Comment 2 Notify RN    COMPREHENSIVE METABOLIC PANEL     Status: Abnormal   Collection Time    05/04/12  5:11 AM       Result Value Range   Sodium 137  135 - 145 mEq/L   Potassium 4.1  3.5 - 5.1 mEq/L   Chloride 98  96 - 112 mEq/L   CO2 27  19 - 32 mEq/L   Glucose, Bld 287 (*) 70 - 99 mg/dL   BUN 29 (*) 6 - 23 mg/dL   Creatinine, Ser 1.12 (*) 0.50 - 1.10 mg/dL   Calcium 9.1  8.4 - 10.5 mg/dL   Total Protein 7.1  6.0 - 8.3 g/dL   Albumin 3.7  3.5 - 5.2 g/dL   AST 18  0 - 37 U/L   ALT 17  0 - 35 U/L   Alkaline Phosphatase 58  39 - 117 U/L   Total Bilirubin 0.2 (*) 0.3 - 1.2 mg/dL   GFR calc non Af Amer 51 (*) >90 mL/min   GFR calc Af Amer 59 (*) >90 mL/min  CBC     Status: None   Collection Time    05/04/12  5:11 AM      Result Value Range   WBC 8.4  4.0 - 10.5 K/uL   RBC 4.12  3.87 - 5.11 MIL/uL   Hemoglobin 13.0  12.0 - 15.0 g/dL   HCT 37.9  36.0 - 46.0 %   MCV 92.0  78.0 - 100.0 fL   MCH 31.6  26.0 - 34.0 pg   MCHC 34.3  30.0 - 36.0 g/dL   RDW 14.4  11.5 - 15.5 %   Platelets 166  150 - 400 K/uL  TROPONIN I     Status: None   Collection Time    05/04/12  5:11 AM      Result Value Range   Troponin I <0.30  <0.30 ng/mL  HEMOGLOBIN A1C     Status: Abnormal   Collection Time    05/04/12  5:11 AM      Result Value Range   Hemoglobin A1C 8.4 (*) <5.7 %   Mean Plasma Glucose 194 (*) <117 mg/dL  PRO B NATRIURETIC PEPTIDE     Status: Abnormal   Collection  Time    05/04/12  5:11 AM      Result Value Range   Pro B Natriuretic peptide (BNP) 1219.0 (*) 0 - 125 pg/mL  POCT I-STAT 3, BLOOD GAS (G3+)     Status: Abnormal   Collection Time    05/04/12  5:52 AM      Result Value Range   pH, Arterial 7.439  7.350 - 7.450   pCO2 arterial 40.7  35.0 - 45.0 mmHg   pO2, Arterial 148.0 (*) 80.0 - 100.0 mmHg   Bicarbonate 27.6 (*) 20.0 - 24.0 mEq/L   TCO2 29  0 - 100 mmol/L   O2 Saturation 99.0     Acid-Base Excess 3.0 (*) 0.0 - 2.0 mmol/L   Patient temperature 98.6 F     Collection site RADIAL, ALLEN'S TEST ACCEPTABLE     Drawn by RT     Sample type ARTERIAL    MRSA PCR SCREENING     Status: None    Collection Time    05/04/12  6:05 AM      Result Value Range   MRSA by PCR NEGATIVE  NEGATIVE  GLUCOSE, CAPILLARY     Status: Abnormal   Collection Time    05/04/12  8:05 AM      Result Value Range   Glucose-Capillary 363 (*) 70 - 99 mg/dL  GLUCOSE, CAPILLARY     Status: Abnormal   Collection Time    05/04/12 12:34 PM      Result Value Range   Glucose-Capillary 423 (*) 70 - 99 mg/dL   Comment 1 Notify RN     Comment 2 Documented in Chart    URINALYSIS, ROUTINE W REFLEX MICROSCOPIC     Status: Abnormal   Collection Time    05/04/12 12:50 PM      Result Value Range   Color, Urine YELLOW  YELLOW   APPearance CLEAR  CLEAR   Specific Gravity, Urine 1.016  1.005 - 1.030   pH 5.0  5.0 - 8.0   Glucose, UA >1000 (*) NEGATIVE mg/dL   Hgb urine dipstick TRACE (*) NEGATIVE   Bilirubin Urine NEGATIVE  NEGATIVE   Ketones, ur NEGATIVE  NEGATIVE mg/dL   Protein, ur NEGATIVE  NEGATIVE mg/dL   Urobilinogen, UA 0.2  0.0 - 1.0 mg/dL   Nitrite NEGATIVE  NEGATIVE   Leukocytes, UA NEGATIVE  NEGATIVE  URINE MICROSCOPIC-ADD ON     Status: None   Collection Time    05/04/12 12:50 PM      Result Value Range   Squamous Epithelial / LPF RARE  RARE   WBC, UA 0-2  <3 WBC/hpf   RBC / HPF 0-2  <3 RBC/hpf    Imaging results:  Dg Chest 2 View  05/03/2012  *RADIOLOGY REPORT*  Clinical Data: Worsening shortness of breath.  Runny nose and chest congestion.  CHEST - 2 VIEW  Comparison: 09/15/2011  Findings: Two views of the chest were obtained.  Plate and screw fixation of the right clavicle is again noted.  Stable elevation of the left hemidiaphragm.  The lungs are clear.  The patient has median sternotomy wires.  Heart size is stable and normal.  IMPRESSION: No acute chest findings.   Original Report Authenticated By: Markus Daft, M.D.    Dg Chest Port 1 View  05/04/2012  *RADIOLOGY REPORT*  Clinical Data: Respiratory distress.  PORTABLE CHEST - 1 VIEW  Comparison: 05/03/2012.  Findings: The cardiac  silhouette, mediastinal and hilar contours are within normal  limits and stable.  Stable surgical changes with median sternotomy wires and right clavicle hardware.  There is stable mild eventration of the left hemidiaphragm with overlying vascular crowding and atelectasis.  There is peribronchial thickening and increased interstitial markings which could suggest bronchitis or mild interstitial edema.  No definite pleural effusions.  IMPRESSION: Peribronchial thickening and increased interstitial markings likely reflecting bronchitis.  Cannot exclude mild interstitial edema.   Original Report Authenticated By: Marijo Sanes, M.D.     Assessment and Plan: I agree with the formulated Assessment and Plan with the following changes:   1. COPD exacerbation with underlying Pulmonary HTN - Bipap, step down unit - PCCM following, intubate if necessary for somnolence/fatigue - Steroids IV - Levaquin - duonebs as tolerated.  - Change home lasix to IV - Strict I/O, daily weight  Other issues as per resident note for management of HTN, DM  Sid Falcon, MD 3/25/20143:09 PM

## 2012-05-04 NOTE — ED Provider Notes (Signed)
Medical screening examination/treatment/procedure(s) were conducted as a shared visit with non-physician practitioner(s) and myself.  I personally evaluated the patient during the encounter Pt with hx copd, presents w non prod cough and wheezing. bil wheezing. Alb neb.   Mirna Mires, MD 05/04/12 [phone]

## 2012-05-04 NOTE — ED Notes (Signed)
Resp took patient off bipap per MD request.

## 2012-05-04 NOTE — ED Notes (Signed)
Patient is resting on stretcher at this time. resp placed patient back on bipap per MD orders. Patient is in no acute distress. resp are even and unlabored at this time. Call light at bedside. Will continue to monitor.

## 2012-05-04 NOTE — H&P (Signed)
Hospital Admission Note Date: 05/04/2012  Patient name: MAHRUKH SEGUIN Medical record number: [id number] Date of birth: [birth year] Age: 62 y.o. Gender: female PCP: [name], DO  Medical Service: Internal Medicine Teaching Service   Attending physician: Dr. Daryll Drown   1st Contact: Dr. Para Skeans    Pager: [phone]  2nd Contact: Dr. Burnard Bunting    Pager: [phone]  After 5 pm or weekends:  1st Contact:      Pager: [phone]  2nd Contact:      Pager: [phone]    Chief Complaint: SOB, dyspnea  History of Present Illness: 62yo F with significant PMH consisting of HTN, DM II, CHF (EF 55-65%, mild LVH, normal systolic fx, grade 2 diastolic dysfunction from 12/2010 ECHO) and COPD on home O2 presents with SOB, chest tightness, dyspnea, and productive cough x3-4 days. She was actually seen in the ED yesterday around midday but left after a breathing treatment and steroids. She was given a prescription for Azithromycin but did not have it filled. This morning, she reports mild yellow/white mucus, sinus pressure, and nasal congestion. She states that she has been using her inhalers at home without relief from her respiratory symptoms.   Meds: Current Outpatient Rx  Name  Route  Sig  Dispense  Refill  . ACCU-CHEK FASTCLIX LANCETS MISC   Does not apply   1 each by Does not apply route 3 (three) times daily before meals. Check blood sugars 3 times daily before breakfast, lunch, and dinner DX code: 250.02   102 each   12   . albuterol (PROVENTIL HFA;VENTOLIN HFA) 108 (90 BASE) MCG/ACT inhaler   Inhalation   Inhale 2 puffs into the lungs every 6 (six) hours as needed. For wheeze or shortness of breath   3 Inhaler   3   . alendronate (FOSAMAX) 70 MG tablet   Oral   Take 70 mg by mouth every 7 (seven) days. Takes on wednesdays         . ALPRAZolam (XANAX) 1 MG tablet   Oral   Take 1 tablet (1 mg total) by mouth 3 (three) times daily as needed for sleep or anxiety.   90 tablet   5   .  aspirin EC 81 MG tablet   Oral   Take 81 mg by mouth every evening.         Marland Kitchen azithromycin (ZITHROMAX) 250 MG tablet   Oral   Take 1 tablet (250 mg total) by mouth daily. Take first 2 tablets together, then 1 every day until finished.   6 tablet   0   . buPROPion (WELLBUTRIN XL) 150 MG 24 hr tablet   Oral   Take 1 tablet (150 mg total) by mouth daily with supper.   90 tablet   3   . calcium carbonate (OS-CAL) 600 MG TABS   Oral   Take 1,200 mg by mouth daily.         Marland Kitchen docusate sodium (COLACE) 50 MG capsule   Oral   Take 100 mg by mouth every morning. Can Alternate with  senokot         . DULoxetine (CYMBALTA) 30 MG capsule   Oral   Take 90 mg by mouth daily with breakfast.          . Elastic Bandages & Supports (TRUFORM STOCKINGS 20-30MMHG) MISC      Wear compression stockings throughout the day, and take off at nighttime.   2 each   1   . ferrous  gluconate (FERGON) 324 MG tablet   Oral   Take 1 tablet (324 mg total) by mouth 2 (two) times daily with a meal.   120 tablet   3   . Fluticasone-Salmeterol (ADVAIR DISKUS) 250-50 MCG/DOSE AEPB   Inhalation   Inhale 1 puff into the lungs every 12 (twelve) hours.   3 each   3   . furosemide (LASIX) 80 MG tablet   Oral   Take 80 mg by mouth 2 (two) times daily.         Marland Kitchen gabapentin (NEURONTIN) 300 MG capsule   Oral   Take 300 mg by mouth 2 (two) times daily.         Marland Kitchen glucose blood (BAYER CONTOUR TEST) test strip      Use to test blood sugar 3 times daily.Dx code 250.00   100 each   12   . HYDROcodone-acetaminophen (NORCO/VICODIN) 5-325 MG per tablet   Oral   Take 1-2 tablets by mouth every 6 (six) hours as needed for pain.         Marland Kitchen insulin aspart (NOVOLOG FLEXPEN) 100 UNIT/ML injection   Subcutaneous   Inject 13-21 Units into the skin See admin instructions. Takes 13 units with meals and also uses SSI with meals in addition to the 13 units of insulin         . insulin detemir (LEVEMIR) 100  UNIT/ML injection   Subcutaneous   Inject 60 Units into the skin at bedtime.         . Insulin Pen Needle (B-D UF III MINI PEN NEEDLES) 31G X 5 MM MISC      DX: 250.02 Please check blood sugars 3 times daily.   100 each   11   . Insulin Syringe-Needle U-100 (INSULIN SYRINGE .3CC/31GX5/16") 31G X 5/16" 0.3 ML MISC   Does not apply   1 Syringe by Does not apply route 2 (two) times daily at 10 AM and 5 PM.   100 each   11   . lisinopril (PRINIVIL,ZESTRIL) 10 MG tablet   Oral   Take 1 tablet (10 mg total) by mouth daily.   30 tablet   3   . loratadine (CLARITIN) 10 MG tablet   Oral   Take 10 mg by mouth daily with supper.         . nystatin (MYCOSTATIN/NYSTOP) 100000 UNIT/GM POWD   Topical   Apply 1 g topically daily as needed (; apply to stomach and under vreasts).         Marland Kitchen omeprazole (PRILOSEC) 40 MG capsule   Oral   Take 1 capsule (40 mg total) by mouth 2 (two) times daily.   60 capsule   5   . potassium chloride SA (K-DUR,KLOR-CON) 20 MEQ tablet   Oral   Take 20-40 mEq by mouth 2 (two) times daily. 2 tablets in the  Morning and 1 tablet at night         . rosuvastatin (CRESTOR) 20 MG tablet   Oral   Take 1 tablet (20 mg total) by mouth daily with supper.   30 tablet   5   . sennosides-docusate sodium (SENOKOT-S) 8.6-50 MG tablet   Oral   Take 2 tablets by mouth every other day as needed for constipation. Hold for loose stools         . Spacer/Aero-Holding Chambers (AEROCHAMBER MV) inhaler      Use as instructed with inhalers.   1 each   0   .  tiotropium (SPIRIVA) 18 MCG inhalation capsule   Inhalation   Place 1 capsule (18 mcg total) into inhaler and inhale daily with breakfast.   30 capsule   11     Allergies: Allergies as of 05/04/2012 - Review Complete 05/04/2012  Allergen Reaction Noted  . Lorazepam  11/04/2010  . Morphine    . Oxycontin (oxycodone) Other (See Comments) 05/03/2012  . Tramadol hcl     Past Medical History   Diagnosis Date  . Depression   . GERD (gastroesophageal reflux disease)     Followed by [name] // small bowel capsule endoscopy (11/2009) - minimal chronic inflammation, otherwise negative for  H.pylori, metaplasia, dysplasia  . Hypertension   . Hyperlipidemia   . Diabetes mellitus type II, uncontrolled     on insulin therapy  . COPD (chronic obstructive pulmonary disease) 2008    started home O2 (12/2010) // Prior significant smoker  // her CT of the chest there was no evidence of primary carcinoid tumor , scattered normal-sized mediastinal and hilar as well as axillary lymph nodes noted., no significant lymphadenopathy. COPD and emphysema with no acute cardiopulmonary disease noted , mild cardiomegaly and solitary calcified  plaque  . Moderate to severe pulmonary hypertension     severe per TEE (08/2010) - Peak RV-RA gradient 45m Hg  . Mitral regurgitation     mild to moderate per TEE (08/2010)  . Shingles   . Anemia     Anemia panel (08/2009) - iron 21, TIBC 441, UIBC 420,  ferritin 11 // Pt has been unable to tolerate iron supplementation due to chronic abdominal discomfort, source of which still is not clear. // Repeat EGD (05/2011) negative, colonoscopy (05/2011) showing only int hemorrhoids, no specific cause of anemia  . History of colonic polyps     Colonoscopy (10/2005) - colon polyp with path nonadenomatous, no malignancy - by Dr. [name] // Colonoscopy (02/2002) - 2 polyps noted, 1 - adenomatous, 2 - hyperplastic // Followed by Dr. [name]// Repeat Colonoscopy (05/2011) - single tubular adenoma polyp - performed by Dr. [name] . Anxiety   . IBS (irritable bowel syndrome)   . Osteoporosis     DEXA (12/2004) - L-spine T score -2.6, Left hip -0.1  . Fibromyalgia   . Obstructive sleep apnea     on CPAP // Sleep study (06/2009) - Moderate sleep apnea/ hypopnea syndrome , AHI 17.8 per hour with nonpositional hypopneas. CPAP titration to 12 CWP, AHI 2.4 per hour. Small resMed  Quattro full-face mask with heated humidifier  . Restless leg syndrome   . Mitral stenosis     moderate per TEE (08/2010) // Mitral valve replacement with a 27-mm pericardial porcine valve (Medtronic Mosaic valve, serial ##[id number] on 09/20/2010 - PIvin Poot// [name] . Bilateral carotid artery stenosis     s/p right endarterectomy (06/2010). // Carotid [place](07/2010) -  60%-79% ICA stenosis, mid range of scale by velocity.  Left: Moderate-to-severe calcific and non-calcific plaque origin andproximal ICA and ECA.  60%-79% ICA stenosis, highest end of scale // Followed by [name]  . Peripheral vascular disease   . Internal hemorrhoids     small internal hemorrhoids noted on colonoscopy 05/2011  . Clear cell renal cell carcinoma 07/21/2011    S/P cryoablation of left RCC in 09/2011 by Dr. YKathlene Cote// Historically - MRI Abdomen (07/2011) - 2.7 x 2.7 x 3.0 cm enhancing solid renal mass posterior aspect of the lower  pole of the left kidney. // CT Abdomen in 09/2010 showed no evidence of mass // Followed by [name]  [facility])   . Diastolic CHF     last echo (12/2010) showing grade 2 diastolic dysfunction. Otherwise, normal EF of 55-65%  . H/O mitral valve replacement 09/30/2011  . A-fib 10/22/2010    S/P Left atrial maze procedure for paroxysmal atrial fibrillation. 09/20/2010 Dr Prescott Gum    Past Surgical History  Procedure Laterality Date  . Orif clavicle fracture  01/2004    by Thana Farr. [name], M.D for Right clavicle nonunion.  . Tonsillectomy    . Carotid endarterectomy  07/04/2010    right, by [name] for asymptomatic right carotid artery stenosis  . Lipoma exision  08/2005    occipital lipoma 1.5cm - by Dr. [name]  . Hysteroscopy with endometrial ablation  06/2001    for persistent post-menopausal bleeding // by S. [name], M.D.  . Mitral valve replacement  09/20/10     with a 27-mm pericardial porcine valve (Medtronic  Mosaic valve, serial #[id number]). 09/20/10, Dr Prescott Gum  . Left atrial maze procedure  09/20/10    for paroxysmal atrial fibrillation (Dr. Prescott Gum)  . Placement of left chest tube.  09/24/2010    Dr Prescott Gum  . Pr vein bypass graft,aorto-fem-pop  May 2012  . Cardiac valve replacement  Aug. 2012  . Colonoscopy  05/12/2011    performed by Dr. Michail Sermon. Showing small internal hemorrhoids, single tubular adenoma polyp  . Esophagogastroduodenoscopy  05/12/2011    performed by Dr. Michail Sermon. Negative for ulcerations, biopsy negative for evidence of celiac sprue  . Tubal ligation     Family History  Problem Relation Age of Onset  . Heart disease Father 22    died to MI at 24yo  . Heart attack Father   . Heart disease Brother 29    died of MI at 63yo  . Heart attack Brother    History   Social History  . Marital Status: Divorced    Spouse Name: N/A    Number of Children: N/A  . Years of Education: N/A   Occupational History  . Not on file.   Social History Main Topics  . Smoking status: Current Every Day Smoker -- 1.00 packs/day for 50 years    Types: Cigarettes    Last Attempt to Quit: 07/12/2010  . Smokeless tobacco: Never Used     Comment: quit 02/01/2012  . Alcohol Use: No  . Drug Use: No  . Sexually Active: No   Other Topics Concern  . Not on file   Social History Narrative  . No narrative on file    Review of Systems: A 10 point ROS was performed; pertinent positives and negatives were noted in the HPI   Physical Exam: Blood pressure 124/70, pulse 105, temperature 98 F (36.7 C), temperature source Axillary, resp. rate 21, SpO2 99.00%. General: Alert, morbidly obese female, appears to be in respiratory distress Head: Normocephalic and atraumatic.  Eyes: Pupils equal, round, and reactive to light, no injection and anicteric.  Mouth: Pharynx pink and moist Neck: Supple, full ROM, TTP of cervical spine Lungs: CTAB, with mild course breath sounds on end-expiration  at LUL, no accessory muscle use, moderate respiratory distress Heart: Tachycardic, regular rhythm, no murmur, no gallop, and no rub.  Abdomen: Soft, non-tender, non-distended Msk: No joint swelling, warmth, or erythema.  Extremities: 2+ radial and 1+DP pulses bilaterally. Trace pitting edema of BLE.  Neurologic: Alert & oriented X3, nonfocal Skin: Turgor normal and no rashes.  Psych: Memory intact for recent and remote, appears to be in distress but in good spirits.   Lab results: Basic Metabolic Panel:  Recent Labs  05/03/12 1104 05/04/12 0035  NA 139 139  K 4.9 3.7  CL 99 100  CO2 31  --   GLUCOSE 216* 288*  BUN 26* 32*  CREATININE 1.16* 1.20*  CALCIUM 10.2  --    Liver Function Tests:  Recent Labs  05/03/12 1104  AST 16  ALT 17  ALKPHOS 63  BILITOT 0.3  PROT 7.3  ALBUMIN 3.8   CBC:  Recent Labs  05/03/12 1104 05/04/12 0021 05/04/12 0035  WBC 7.1 7.7  --   NEUTROABS 4.8 5.6  --   HGB 13.7 13.2 13.9  HCT 39.9 38.6 41.0  MCV 92.8 91.9  --   PLT 175 175  --     Urinalysis:  Recent Labs  05/03/12 1305  COLORURINE YELLOW  LABSPEC 1.019  PHURINE 5.5  GLUCOSEU NEGATIVE  HGBUR TRACE*  BILIRUBINUR NEGATIVE  KETONESUR NEGATIVE  PROTEINUR NEGATIVE  UROBILINOGEN 0.2  NITRITE NEGATIVE  LEUKOCYTESUR NEGATIVE    Imaging results:  Dg Chest 2 View  05/03/2012  *RADIOLOGY REPORT*  Clinical Data: Worsening shortness of breath.  Runny nose and chest congestion.  CHEST - 2 VIEW  Comparison: 09/15/2011  Findings: Two views of the chest were obtained.  Plate and screw fixation of the right clavicle is again noted.  Stable elevation of the left hemidiaphragm.  The lungs are clear.  The patient has median sternotomy wires.  Heart size is stable and normal.  IMPRESSION: No acute chest findings.   Original Report Authenticated By: Markus Daft, M.D.    Dg Chest Port 1 View  05/04/2012  *RADIOLOGY REPORT*  Clinical Data: Respiratory distress.  PORTABLE CHEST - 1 VIEW   Comparison: 05/03/2012.  Findings: The cardiac silhouette, mediastinal and hilar contours are within normal limits and stable.  Stable surgical changes with median sternotomy wires and right clavicle hardware.  There is stable mild eventration of the left hemidiaphragm with overlying vascular crowding and atelectasis.  There is peribronchial thickening and increased interstitial markings which could suggest bronchitis or mild interstitial edema.  No definite pleural effusions.  IMPRESSION: Peribronchial thickening and increased interstitial markings likely reflecting bronchitis.  Cannot exclude mild interstitial edema.   Original Report Authenticated By: Marijo Sanes, M.D.     Other results: EKG: No acute ST changes. Significant artifact present. Repeating EKG.  Assessment & Plan by Problem: 62yo F with significant PMH consisting of HTN, DM II, CHF (EF 55-65%, mild LVH, normal systolic fx, grade 2 diastolic dysfunction from 12/2010 ECHO) and COPD on home O2 presents with SOB, chest tightness, dyspnea, and productive cough x3-4 days.   1. COPD exacerbation: Pt is on Spiriva, Advair 250/50 at home with albuterol PRN. Over the past 3-4 days, she has been having worsening SOB, dyspnea, and productive cough. She also c/o sinus pressure and nasal congestion. Possible this exacerbation was precipitated by sinus drainage. In the ED, she was placed on bipap and has done well. She is afebrile and has no leukocytosis. CXR concerning for bronchitis but cannot exclude mild interstitial edema. Lung exam with mild coarseness in LUL, otherwise clear. No edema noted on exam. Will treat as though infectious component along with possible CHF component worsening COPD exacerbation. - Admit to IMTS to SDU - Continue Bipap - Levaquin - Solumedrol  153m IV BID - Duonebs q4h  - Continue Advair 250/50 BID - Continue home Lasix 851mpo BID - Lasix 20 IV now - ProBNP - Troponin x1 - Strict I&Os - Daily weights - NPO  2.  Diabetes Mellitus, Type II: Last Hgb A1c was 8.0 01/2012. Despite reproted compliance with her medications of Levemir 60u qhs and Novolog 13u with meals and SSI, her blood glucose is elevated to 288 in the ED.  - Decreasing Levemir to 20u b/c pt will be NPO - SSI  3. HTN: BP well controlled in the ED. She is on Lasix 8076mo BID and Lisinopril 88m90m daily. Holding Lisinopril due to mildly elevated Cr and wee are holding IVF.  - Lasix 20mg32m now - Lasix 80mg 77maily  4. DVT PPx: Heparin  Dispo: Disposition is deferred at this time, awaiting improvement of current medical problems. Anticipated discharge in approximately 1-3 day(s).   The patient does have a current PCP (KALIA-REYNOLDS, SHELLY, DO), therefore will not be requiring OPC follow-up after discharge.   The patient does not have transportation limitations that hinder transportation to clinic appointments.  Signed: Glenn,Otho Bellows2014, 1:36 AM

## 2012-05-04 NOTE — ED Notes (Addendum)
Patient brought in by EMS, called for shortness of breath. Was seen here earlier today.EMS reports O2 was 89% on arrival.  EMS gave 2 neb tx, total of 39m of albuterol and 0.561mof Atrovent. Patient has hx of CPOD. Wheezing in upper lobes. Patient is coughing.

## 2012-05-04 NOTE — Progress Notes (Signed)
Evaluated Ms. Cabanilla at 4pm, she is much improved, mentating appropriately off of Bipap.  She is requesting Jello.  Nursing in room at time of evaluation.  Patient looks much improved compared to this AM.   Will give trial of clear liquid diet for this pm.   Signed  MULLEN, EMILY

## 2012-05-04 NOTE — ED Provider Notes (Signed)
History     CSN: [id number]  Arrival date & time 05/04/12  0005   First MD Initiated Contact with Patient 05/04/12 0012      Chief Complaint  Patient presents with  . Respiratory Distress    (Consider location/radiation/quality/duration/timing/severity/associated sxs/prior treatment) HPI Level 5 Caveat: respiratory distress. Patient unable to complete history and ROS due to dyspnea. This is a 62 year old female with a history of chronic obstructive pulmonary disease. She is here with a 3 day exacerbation of her CAPD. She was seen yesterday morning for dyspnea and was discharged home after treatment. She returns more short of breath. Her symptoms are moderate to severe. She was given albuterol by EMS prior to her arrival she states is her third neb treatment for this exacerbation. She was not given steroids by EMS she received a dose yesterday in the ED. She is also complaining of generalized pain due to her fibromyalgia which is exacerbated by her dyspnea. She also complains of a headache.  Past Medical History  Diagnosis Date  . Depression   . GERD (gastroesophageal reflux disease)     Followed by [name] // small bowel capsule endoscopy (11/2009) - minimal chronic inflammation, otherwise negative for  H.pylori, metaplasia, dysplasia  . Hypertension   . Hyperlipidemia   . Diabetes mellitus type II, uncontrolled     on insulin therapy  . COPD (chronic obstructive pulmonary disease) 2008    started home O2 (12/2010) // Prior significant smoker  // her CT of the chest there was no evidence of primary carcinoid tumor , scattered normal-sized mediastinal and hilar as well as axillary lymph nodes noted., no significant lymphadenopathy. COPD and emphysema with no acute cardiopulmonary disease noted , mild cardiomegaly and solitary calcified  plaque  . Moderate to severe pulmonary hypertension     severe per TEE (08/2010) - Peak RV-RA gradient 70m Hg  . Mitral regurgitation     mild to  moderate per TEE (08/2010)  . Shingles   . Anemia     Anemia panel (08/2009) - iron 21, TIBC 441, UIBC 420,  ferritin 11 // Pt has been unable to tolerate iron supplementation due to chronic abdominal discomfort, source of which still is not clear. // Repeat EGD (05/2011) negative, colonoscopy (05/2011) showing only int hemorrhoids, no specific cause of anemia  . History of colonic polyps     Colonoscopy (10/2005) - colon polyp with path nonadenomatous, no malignancy - by Dr. [name] // Colonoscopy (02/2002) - 2 polyps noted, 1 - adenomatous, 2 - hyperplastic // Followed by Dr. [name]// Repeat Colonoscopy (05/2011) - single tubular adenoma polyp - performed by Dr. [name] . Anxiety   . IBS (irritable bowel syndrome)   . Osteoporosis     DEXA (12/2004) - L-spine T score -2.6, Left hip -0.1  . Fibromyalgia   . Obstructive sleep apnea     on CPAP // Sleep study (06/2009) - Moderate sleep apnea/ hypopnea syndrome , AHI 17.8 per hour with nonpositional hypopneas. CPAP titration to 12 CWP, AHI 2.4 per hour. Small resMed Quattro full-face mask with heated humidifier  . Restless leg syndrome   . Mitral stenosis     moderate per TEE (08/2010) // Mitral valve replacement with a 27-mm pericardial porcine valve (Medtronic Mosaic valve, serial ##[id number] on 09/20/2010 - PIvin Poot// [name] . Bilateral carotid artery stenosis     s/p right endarterectomy (06/2010). // Carotid [place]orea(07/2010) -  60%-79% ICA  stenosis, mid range of scale by velocity.  Left: Moderate-to-severe calcific and non-calcific plaque origin andproximal ICA and ECA.  60%-79% ICA stenosis, highest end of scale // Followed by [name].  . Peripheral vascular disease   . Internal hemorrhoids     small internal hemorrhoids noted on colonoscopy 05/2011  . Clear cell renal cell carcinoma 07/21/2011    S/P cryoablation of left RCC in 09/2011 by [name] // Historically - MRI Abdomen (07/2011) -  2.7 x 2.7 x 3.0 cm enhancing solid renal mass posterior aspect of the lower pole of the left kidney. // CT Abdomen in 09/2010 showed no evidence of mass // Followed by [name]  [facility])   . Diastolic CHF     last echo (12/2010) showing grade 2 diastolic dysfunction. Otherwise, normal EF of 55-65%  . H/O mitral valve replacement 09/30/2011  . A-fib 10/22/2010    S/P Left atrial maze procedure for paroxysmal atrial fibrillation. 09/20/2010 Dr Prescott Gum     Past Surgical History  Procedure Laterality Date  . Orif clavicle fracture  01/2004    by Thana Farr. [name], M.D for Right clavicle nonunion.  . Tonsillectomy    . Carotid endarterectomy  07/04/2010    right, by [name] for asymptomatic right carotid artery stenosis  . Lipoma exision  08/2005    occipital lipoma 1.5cm - by Dr. [name]  . Hysteroscopy with endometrial ablation  06/2001    for persistent post-menopausal bleeding // by S. [name], M.D.  . Mitral valve replacement  09/20/10     with a 27-mm pericardial porcine valve (Medtronic Mosaic valve, serial #[id number]). 09/20/10, Dr Prescott Gum  . Left atrial maze procedure  09/20/10    for paroxysmal atrial fibrillation (Dr. Prescott Gum)  . Placement of left chest tube.  09/24/2010    Dr Prescott Gum  . Pr vein bypass graft,aorto-fem-pop  May 2012  . Cardiac valve replacement  Aug. 2012  . Colonoscopy  05/12/2011    performed by Dr. Michail Sermon. Showing small internal hemorrhoids, single tubular adenoma polyp  . Esophagogastroduodenoscopy  05/12/2011    performed by Dr. Michail Sermon. Negative for ulcerations, biopsy negative for evidence of celiac sprue  . Tubal ligation      Family History  Problem Relation Age of Onset  . Heart disease Father 23    died to MI at 65yo  . Heart attack Father   . Heart disease Brother 57    died of MI at 69yo  . Heart attack Brother     History  Substance Use Topics  . Smoking status: Current Every Day Smoker -- 1.00 packs/day for 50 years     Types: Cigarettes    Last Attempt to Quit: 07/12/2010  . Smokeless tobacco: Never Used     Comment: quit 02/01/2012  . Alcohol Use: No    OB History   Grav Para Term Preterm Abortions TAB SAB Ect Mult Living                  Review of Systems  Unable to perform ROS   Allergies  Lorazepam; Morphine; Oxycontin; and Tramadol hcl  Home Medications   Current Outpatient Rx  Name  Route  Sig  Dispense  Refill  . ACCU-CHEK FASTCLIX LANCETS MISC   Does not apply   1 each by Does not apply route 3 (three) times daily before meals. Check blood sugars 3 times daily before breakfast, lunch, and dinner DX code: 250.02  102 each   12   . albuterol (PROVENTIL HFA;VENTOLIN HFA) 108 (90 BASE) MCG/ACT inhaler   Inhalation   Inhale 2 puffs into the lungs every 6 (six) hours as needed. For wheeze or shortness of breath   3 Inhaler   3   . alendronate (FOSAMAX) 70 MG tablet   Oral   Take 70 mg by mouth every 7 (seven) days. Takes on wednesdays         . ALPRAZolam (XANAX) 1 MG tablet   Oral   Take 1 tablet (1 mg total) by mouth 3 (three) times daily as needed for sleep or anxiety.   90 tablet   5   . aspirin EC 81 MG tablet   Oral   Take 81 mg by mouth every evening.         Marland Kitchen azithromycin (ZITHROMAX) 250 MG tablet   Oral   Take 1 tablet (250 mg total) by mouth daily. Take first 2 tablets together, then 1 every day until finished.   6 tablet   0   . buPROPion (WELLBUTRIN XL) 150 MG 24 hr tablet   Oral   Take 1 tablet (150 mg total) by mouth daily with supper.   90 tablet   3   . calcium carbonate (OS-CAL) 600 MG TABS   Oral   Take 1,200 mg by mouth daily.         Marland Kitchen docusate sodium (COLACE) 50 MG capsule   Oral   Take 100 mg by mouth every morning. Can Alternate with  senokot         . DULoxetine (CYMBALTA) 30 MG capsule   Oral   Take 90 mg by mouth daily with breakfast.          . Elastic Bandages & Supports (TRUFORM STOCKINGS 20-30MMHG) MISC       Wear compression stockings throughout the day, and take off at nighttime.   2 each   1   . ferrous gluconate (FERGON) 324 MG tablet   Oral   Take 1 tablet (324 mg total) by mouth 2 (two) times daily with a meal.   120 tablet   3   . Fluticasone-Salmeterol (ADVAIR DISKUS) 250-50 MCG/DOSE AEPB   Inhalation   Inhale 1 puff into the lungs every 12 (twelve) hours.   3 each   3   . furosemide (LASIX) 80 MG tablet   Oral   Take 80 mg by mouth 2 (two) times daily.         Marland Kitchen gabapentin (NEURONTIN) 300 MG capsule   Oral   Take 300 mg by mouth 2 (two) times daily.         Marland Kitchen glucose blood (BAYER CONTOUR TEST) test strip      Use to test blood sugar 3 times daily.Dx code 250.00   100 each   12   . HYDROcodone-acetaminophen (NORCO/VICODIN) 5-325 MG per tablet   Oral   Take 1-2 tablets by mouth every 6 (six) hours as needed for pain.         Marland Kitchen insulin aspart (NOVOLOG FLEXPEN) 100 UNIT/ML injection   Subcutaneous   Inject 13-21 Units into the skin See admin instructions. Takes 13 units with meals and also uses SSI with meals in addition to the 13 units of insulin         . insulin detemir (LEVEMIR) 100 UNIT/ML injection   Subcutaneous   Inject 60 Units into the skin at bedtime.         Marland Kitchen  Insulin Pen Needle (B-D UF III MINI PEN NEEDLES) 31G X 5 MM MISC      DX: 250.02 Please check blood sugars 3 times daily.   100 each   11   . Insulin Syringe-Needle U-100 (INSULIN SYRINGE .3CC/31GX5/16") 31G X 5/16" 0.3 ML MISC   Does not apply   1 Syringe by Does not apply route 2 (two) times daily at 10 AM and 5 PM.   100 each   11   . lisinopril (PRINIVIL,ZESTRIL) 10 MG tablet   Oral   Take 1 tablet (10 mg total) by mouth daily.   30 tablet   3   . loratadine (CLARITIN) 10 MG tablet   Oral   Take 10 mg by mouth daily with supper.         . nystatin (MYCOSTATIN/NYSTOP) 100000 UNIT/GM POWD   Topical   Apply 1 g topically daily as needed (; apply to stomach and under  vreasts).         Marland Kitchen omeprazole (PRILOSEC) 40 MG capsule   Oral   Take 1 capsule (40 mg total) by mouth 2 (two) times daily.   60 capsule   5   . potassium chloride SA (K-DUR,KLOR-CON) 20 MEQ tablet   Oral   Take 20-40 mEq by mouth 2 (two) times daily. 2 tablets in the  Morning and 1 tablet at night         . rosuvastatin (CRESTOR) 20 MG tablet   Oral   Take 1 tablet (20 mg total) by mouth daily with supper.   30 tablet   5   . sennosides-docusate sodium (SENOKOT-S) 8.6-50 MG tablet   Oral   Take 2 tablets by mouth every other day as needed for constipation. Hold for loose stools         . Spacer/Aero-Holding Chambers (AEROCHAMBER MV) inhaler      Use as instructed with inhalers.   1 each   0   . tiotropium (SPIRIVA) 18 MCG inhalation capsule   Inhalation   Place 1 capsule (18 mcg total) into inhaler and inhale daily with breakfast.   30 capsule   11     BP 124/70  Pulse 105  Temp(Src) 98 F (36.7 C) (Axillary)  Resp 21  SpO2 99%  Physical Exam General: Well-developed, well-nourished female in moderate distress; appearance consistent with age of record HENT: normocephalic, atraumatic Eyes: pupils equal round and reactive to light; extraocular muscles intact Neck: supple Heart: regular rate and rhythm; tachycardic Lungs: Tachypneic; expiratory wheezes anteriorly, distant sounds posteriorly Abdomen: soft; nondistended; nontender; bowel sounds present Extremities: No deformity; full range of motion; pulses normal; no edema Neurologic: Awake, alert; motor function intact in all extremities and symmetric; no facial droop Skin: Warm and dry Psychiatric: Anxious    ED Course  Procedures (including critical care time)    MDM   Nursing notes and vitals signs, including pulse oximetry, reviewed.  Summary of this visit's results, reviewed by myself:  Labs:  Results for orders placed during the hospital encounter of 05/04/12 (from the past 24 hour(s))   CBC WITH DIFFERENTIAL     Status: Abnormal   Collection Time    05/04/12 12:21 AM      Result Value Range   WBC 7.7  4.0 - 10.5 K/uL   RBC 4.20  3.87 - 5.11 MIL/uL   Hemoglobin 13.2  12.0 - 15.0 g/dL   HCT 38.6  36.0 - 46.0 %   MCV 91.9  78.0 -  100.0 fL   MCH 31.4  26.0 - 34.0 pg   MCHC 34.2  30.0 - 36.0 g/dL   RDW 14.4  11.5 - 15.5 %   Platelets 175  150 - 400 K/uL   Neutrophils Relative 72  43 - 77 %   Neutro Abs 5.6  1.7 - 7.7 K/uL   Lymphocytes Relative 13  12 - 46 %   Lymphs Abs 1.0  0.7 - 4.0 K/uL   Monocytes Relative 14 (*) 3 - 12 %   Monocytes Absolute 1.1 (*) 0.1 - 1.0 K/uL   Eosinophils Relative 0  0 - 5 %   Eosinophils Absolute 0.0  0.0 - 0.7 K/uL   Basophils Relative 0  0 - 1 %   Basophils Absolute 0.0  0.0 - 0.1 K/uL  POCT I-STAT 3, BLOOD GAS (G3P V)     Status: Abnormal   Collection Time    05/04/12 12:34 AM      Result Value Range   pH, Ven 7.320 (*) 7.250 - 7.300   pCO2, Ven 55.5 (*) 45.0 - 50.0 mmHg   pO2, Ven 24.0 (*) 30.0 - 45.0 mmHg   Bicarbonate 28.6 (*) 20.0 - 24.0 mEq/L   TCO2 30  0 - 100 mmol/L   O2 Saturation 37.0     Acid-Base Excess 1.0  0.0 - 2.0 mmol/L   Sample type VENOUS    POCT I-STAT, CHEM 8     Status: Abnormal   Collection Time    05/04/12 12:35 AM      Result Value Range   Sodium 139  135 - 145 mEq/L   Potassium 3.7  3.5 - 5.1 mEq/L   Chloride 100  96 - 112 mEq/L   BUN 32 (*) 6 - 23 mg/dL   Creatinine, Ser 1.20 (*) 0.50 - 1.10 mg/dL   Glucose, Bld 288 (*) 70 - 99 mg/dL   Calcium, Ion 1.20  1.13 - 1.30 mmol/L   TCO2 27  0 - 100 mmol/L   Hemoglobin 13.9  12.0 - 15.0 g/dL   HCT 41.0  36.0 - 46.0 %    Imaging Studies: Dg Chest 2 View  05/03/2012  *RADIOLOGY REPORT*  Clinical Data: Worsening shortness of breath.  Runny nose and chest congestion.  CHEST - 2 VIEW  Comparison: 09/15/2011  Findings: Two views of the chest were obtained.  Plate and screw fixation of the right clavicle is again noted.  Stable elevation of the left  hemidiaphragm.  The lungs are clear.  The patient has median sternotomy wires.  Heart size is stable and normal.  IMPRESSION: No acute chest findings.   Original Report Authenticated By: Markus Daft, M.D.    Dg Chest Port 1 View  05/04/2012  *RADIOLOGY REPORT*  Clinical Data: Respiratory distress.  PORTABLE CHEST - 1 VIEW  Comparison: 05/03/2012.  Findings: The cardiac silhouette, mediastinal and hilar contours are within normal limits and stable.  Stable surgical changes with median sternotomy wires and right clavicle hardware.  There is stable mild eventration of the left hemidiaphragm with overlying vascular crowding and atelectasis.  There is peribronchial thickening and increased interstitial markings which could suggest bronchitis or mild interstitial edema.  No definite pleural effusions.  IMPRESSION: Peribronchial thickening and increased interstitial markings likely reflecting bronchitis.  Cannot exclude mild interstitial edema.   Original Report Authenticated By: Marijo Sanes, M.D.       12:27 AM Patient placed on BiPAP due to distress.  1:24 AM Comfortable on BiPAP.  Internal medicine resident to see patient in ED.   EKG Interpretation:  Date & Time: 05/04/2012 12:18 AM  Rate: 110  Rhythm: sinus tachycardia  QRS Axis: right  Intervals: PR prolonged  ST/T Wave abnormalities: normal  Conduction Disutrbances:first-degree A-V block   Narrative Interpretation: Artifact present  Old EKG Reviewed: Rate is faster          Wynetta Fines, MD 05/04/12 0124

## 2012-05-04 NOTE — Progress Notes (Signed)
I have reviewed progress note by Cheral Almas, MSIV. Please see my additional documentation below.  Subjective: No acute events overnight. Breathing seems to be improved, but remains on bipap and remains somnolent. Reports she is tiring out.  Objective: Vital signs in last 24 hours: Filed Vitals:   05/04/12 0800 05/04/12 0838 05/04/12 0900 05/04/12 1000  BP: 129/77 129/77 128/60 142/71  Pulse: 93 100 93 100  Temp:      TempSrc:      Resp: _0 Weight:      SpO2: 94% 94% 97% 96%   Weight change:   Intake/Output Summary (Last 24 hours) at 05/04/12 1105 Last data filed at 05/04/12 0800  Gross per 24 hour  Intake    200 ml  Output      0 ml  Net    200 ml   General: resting in bed, somnolent  HEENT: PERRL, EOMI, no scleral icterus Cardiac: mildly tachycardia, no rubs, murmurs or gallops Pulm: diminished with b/l expiratory wheezing. Abd: soft, nontender, nondistended, BS normoactive Ext: warm and well perfused, no pedal edema Neuro: alert and oriented X3, cranial nerves II-XII grossly intact  Lab Results: Basic Metabolic Panel:  Recent Labs Lab 05/03/12 1104 05/04/12 0035 05/04/12 0511  NA 139 139 137  K 4.9 3.7 4.1  CL 99 100 98  CO2 31  --  27  GLUCOSE 216* 288* 287*  BUN 26* 32* 29*  CREATININE 1.16* 1.20* 1.12*  CALCIUM 10.2  --  9.1   Liver Function Tests:  Recent Labs Lab 05/03/12 1104 05/04/12 0511  AST 16 18  ALT 17 17  ALKPHOS 63 58  BILITOT 0.3 0.2*  PROT 7.3 7.1  ALBUMIN 3.8 3.7   CBC:  Recent Labs Lab 05/03/12 1104 05/04/12 0021 05/04/12 0035 05/04/12 0511  WBC 7.1 7.7  --  8.4  NEUTROABS 4.8 5.6  --   --   HGB 13.7 13.2 13.9 13.0  HCT 39.9 38.6 41.0 37.9  MCV 92.8 91.9  --  92.0  PLT 175 175  --  166   Cardiac Enzymes:  Recent Labs Lab 05/04/12 0511  TROPONINI <0.30   BNP:  Recent Labs Lab 05/04/12 0511  PROBNP 1219.0*   CBG:  Recent Labs Lab 05/04/12 0412 05/04/12 0805  GLUCAP 294* 363*    Hemoglobin A1C:  Recent Labs Lab 05/04/12 0511  HGBA1C 8.4*   Urinalysis:  Recent Labs Lab 05/03/12 1305  COLORURINE YELLOW  LABSPEC 1.019  PHURINE 5.5  GLUCOSEU NEGATIVE  HGBUR TRACE*  BILIRUBINUR NEGATIVE  KETONESUR NEGATIVE  PROTEINUR NEGATIVE  UROBILINOGEN 0.2  NITRITE NEGATIVE  LEUKOCYTESUR NEGATIVE    Micro Results: Recent Results (from the past 240 hour(s))  MRSA PCR SCREENING     Status: None   Collection Time    05/04/12  6:05 AM      Result Value Range Status   MRSA by PCR NEGATIVE  NEGATIVE Final   Comment:            The GeneXpert MRSA Assay (FDA     approved for NASAL specimens     only), is one component of a     comprehensive MRSA colonization     surveillance program. It is not     intended to diagnose MRSA     infection nor to guide or     monitor treatment for     MRSA infections.   Studies/Results: Dg Chest 2 View  05/03/2012   Findings:  Two views of the chest were obtained.  Plate and screw fixation of the right clavicle is again noted.  Stable elevation of the left hemidiaphragm.  The lungs are clear.  The patient has median sternotomy wires.  Heart size is stable and normal.  IMPRESSION: No acute chest findings.   Original Report Authenticated By: Markus Daft, M.D.    Dg Chest Port 1 View  05/04/2012   Findings: The cardiac silhouette, mediastinal and hilar contours are within normal limits and stable.  Stable surgical changes with median sternotomy wires and right clavicle hardware.  There is stable mild eventration of the left hemidiaphragm with overlying vascular crowding and atelectasis.  There is peribronchial thickening and increased interstitial markings which could suggest bronchitis or mild interstitial edema.  No definite pleural effusions.  IMPRESSION: Peribronchial thickening and increased interstitial markings likely reflecting bronchitis.  Cannot exclude mild interstitial edema.   Original Report Authenticated By: Marijo Sanes,  M.D.    Medications: I have reviewed the patient's current medications. Scheduled Meds: . antiseptic oral rinse  15 mL Mouth Rinse q12n4p  . aspirin EC  81 mg Oral QPM  . atorvastatin  10 mg Oral q1800  . buPROPion  150 mg Oral Q supper  . calcium carbonate  1 tablet Oral Q breakfast  . chlorhexidine  15 mL Mouth Rinse BID  . docusate sodium  100 mg Oral q morning - 10a  . DULoxetine  90 mg Oral Q breakfast  . ferrous gluconate  324 mg Oral BID WC  . furosemide  40 mg Intravenous Q8H  . gabapentin  300 mg Oral BID  . heparin  5,000 Units Subcutaneous Q8H  . insulin aspart  0-9 Units Subcutaneous Q4H  . insulin detemir  20 Units Subcutaneous QHS  . ipratropium  0.5 mg Nebulization Q4H  . levalbuterol  0.63 mg Nebulization Q4H  . levofloxacin (LEVAQUIN) IV  750 mg Intravenous Q24H  . loratadine  10 mg Oral Q supper  . methylPREDNISolone (SOLU-MEDROL) injection  60 mg Intravenous Q6H  . mometasone-formoterol  2 puff Inhalation BID  . pantoprazole  40 mg Oral Daily  . potassium chloride SA  20 mEq Oral QHS  . potassium chloride  40 mEq Oral Daily  . potassium chloride  40 mEq Oral Once  . sodium chloride  3 mL Intravenous Q12H  . sodium chloride  3 mL Intravenous Q12H   Continuous Infusions:  PRN Meds:.sodium chloride, acetaminophen, acetaminophen, albuterol, ALPRAZolam, alum & mag hydroxide-simeth, nystatin, ondansetron (ZOFRAN) IV, ondansetron, sodium chloride   Assessment/Plan: 62 yo F with history of COPD on home O2, HTN, HLD, DM, pulm HTN, OSA, Afib s/p left atrial maze procedure (no anticoag given fall risk) presented on 05/04/12 with SOB.  #COPD exacerbation: ABG does not suggest hypoxia or hypercapnea, and she seems to be saturating >90% on bipap, but starting to tire out. Of note, she has been given lasix, and bladder scan revealed 999cc.  Foley has been placed to help relieve intra-abdominal pressure and diaphragm expansion.  -Anticipate intubation soon per PCCM (when  daughter arrives) - of note, patient is anxious about intubation as she is worried she won't come off of the vent this time --> Bipap for now -continue IV steroids & antibiotics -Continue duonebs scheduled & albuterol prn & advair -If intubated, will plan for PCCM to act as primary until out of unit  #Diastolic CHF: Has been given Lasix 90m IV and 877mPO; Foley now placed; physical exam and labs (pBNP  1219) less concerning for CHF exacerbation, but goal to keep lungs dry -Repeat echo -Continue home dose lasix -Monitor i/o -AM labs: bmet  #DM: A1c 8 in 01/2012. Home regimen includes Levemir 60 qHS and SSI -Currently on Levemir 20u qHS and SSI, but may transition to ICU hyperglycemia protocol if intubated  #HTN: BP mildly elevated in setting of resp distress -Lisinopril held given mildly elevated Cr -Continue lasix  -->Remainder of chronic issues per Cheral Almas MSIV's note  Dispo: Disposition is deferred at this time, awaiting improvement of current medical problems.  Anticipated discharge in approximately 2-3 day(s).   The patient does have a current PCP (KALIA-REYNOLDS, SHELLY, DO), therefore will be requiring OPC follow-up after discharge.   The patient does not know if patient has transportation limitations that hinder transportation to clinic appointments.  .Services Needed at time of discharge: Y = Yes, Blank = No PT:   OT:   RN:   Equipment:   Other:     LOS: 0 days   SHARDA, NEEMA 05/04/2012, 11:05 AM

## 2012-05-04 NOTE — Progress Notes (Signed)
  Echocardiogram 2D Echocardiogram has been performed.  [name] 05/04/2012, 1:14 PM

## 2012-05-04 NOTE — Telephone Encounter (Addendum)
Patient now hospitalized. Will follow plan of care and then will require close hospital follow-up.  Thank you! - [name], D.O., 05/04/2012, 7:37 AM

## 2012-05-04 NOTE — ED Notes (Signed)
Patient report called to [facility] for admission. Nurse voiced understanding of report and had no further questions. Patient is in stable condition for transport. Patient will be going via stretcher, antibiotic infusing, monitor and resp with bipap.

## 2012-05-04 NOTE — ED Notes (Signed)
Lab at bedside to obtain blood

## 2012-05-04 NOTE — ED Notes (Signed)
resp here to place patient on venti mask per MD request.

## 2012-05-04 NOTE — ED Notes (Signed)
Patients O2 dropped down to 89%, placed patient on 2L [place]. Patient states she usually wears 1L at home. Patient states she's feeling anxious at this time. Encouraged patient to take deep breathes through her nose. Patient continues to cough. Call light at bedside. Will continue to monitor patient.

## 2012-05-04 NOTE — Consult Note (Signed)
PULMONARY  / CRITICAL CARE MEDICINE  Name: [name] MRN: [id number] DOB: [birth year]    ADMISSION DATE:  05/04/2012 CONSULTATION DATE:  05/04/12  REFERRING MD :  Graciella Freer  CHIEF COMPLAINT:  VDRF  BRIEF PATIENT DESCRIPTION: 62 year old female with history of COPD and CHF who recently started smoking again presenting with respiratory failure requiring BiPAP.  ABG stabilized but overnight increase WOB.  PCCM consulted due to respiratory failure.  Also history of pulmonary HTN on echo in 2012.  SIGNIFICANT EVENTS / STUDIES:  3/25 VDRF requiring NIMV.  LINES / TUBES: PIV  CULTURES: Blood 3/25>>> Urine 3/25>>> Sputum 3/25>>>  ANTIBIOTICS: Levofloxacin 3/25>>>  PAST MEDICAL HISTORY :  Past Medical History  Diagnosis Date  . Depression   . GERD (gastroesophageal reflux disease)     Followed by [name] // small bowel capsule endoscopy (11/2009) - minimal chronic inflammation, otherwise negative for  H.pylori, metaplasia, dysplasia  . Hypertension   . Hyperlipidemia   . Diabetes mellitus type II, uncontrolled     on insulin therapy  . COPD (chronic obstructive pulmonary disease) 2008    started home O2 (12/2010) // (+) significant tobacco abuse  . Moderate to severe pulmonary hypertension     severe per TEE (08/2010) - Peak RV-RA gradient 43m Hg  . Mitral regurgitation     mild to moderate per TEE (08/2010)  . Shingles   . Iron deficiency anemia     Prior BL Hgb 9-11. Source initially unclear with EGD/Colonoscopy (05/2011) not indicative of active bleeding source // DX with RCC in 07/2011, s/p cryoablation - with subsequent normalization of Hgb. (2014)  . History of colonic polyps     Colonoscopy (10/2005) - colon polyp with path nonadenomatous, no malignancy - by Dr. [name] // Colonoscopy (02/2002) - 2 polyps noted, 1 - adenomatous, 2 - hyperplastic // Followed by Dr. [name]// Repeat Colonoscopy (05/2011) - single tubular adenoma polyp - performed by Dr. [name] . Anxiety    . IBS (irritable bowel syndrome)   . Osteoporosis     DEXA (12/2004) - L-spine T score -2.6, Left hip -0.1  . Fibromyalgia   . Obstructive sleep apnea     on CPAP // Sleep study (06/2009) - Moderate sleep apnea/ hypopnea syndrome , AHI 17.8 per hour with nonpositional hypopneas. CPAP titration to 12 CWP, AHI 2.4 per hour. Small resMed Quattro full-face mask with heated humidifier  . Restless leg syndrome   . Mitral stenosis     moderate per TEE (08/2010) // Mitral valve replacement with a 27-mm pericardial porcine valve (Medtronic Mosaic valve, serial ##[id number] on 09/20/2010 - PIvin Poot// [name] . Bilateral carotid artery stenosis     s/p right endarterectomy (06/2010). // Carotid UKorea(07/2010) -  60%-79% ICA stenosis, mid range of scale by velocity.  Left: Moderate-to-severe calcific and non-calcific plaque origin andproximal ICA and ECA.  60%-79% ICA stenosis, highest end of scale // Followed by [name]  . Peripheral vascular disease   . Internal hemorrhoids     small internal hemorrhoids noted on colonoscopy 05/2011  . Clear cell renal cell carcinoma 07/21/2011    S/P cryoablation of left RCC in 09/2011 by Dr. YKathlene Cote// Historically - MRI Abdomen (07/2011) - 2.7 x 2.7 x 3.0 cm enhancing solid renal mass posterior aspect of the lower pole of the left kidney. // CT Abdomen in 09/2010 showed no evidence of mass // Followed by Dr. DDiona Fanti (  [facility])   . Diastolic CHF     last echo (12/2010) showing grade 2 diastolic dysfunction. Otherwise, normal EF of 55-65%  . H/O mitral valve replacement 09/30/2011  . A-fib 10/22/2010    S/P Left atrial maze procedure for paroxysmal atrial fibrillation. 09/20/2010 Dr Prescott Gum    Past Surgical History  Procedure Laterality Date  . Orif clavicle fracture  01/2004    by Thana Farr. [name], M.D for Right clavicle nonunion.  . Tonsillectomy    . Carotid endarterectomy  07/04/2010    right, by Dr.  Trula Slade for asymptomatic right carotid artery stenosis  . Lipoma exision  08/2005    occipital lipoma 1.5cm - by Dr. [name]  . Hysteroscopy with endometrial ablation  06/2001    for persistent post-menopausal bleeding // by S. [name], M.D.  . Mitral valve replacement  09/20/10     with a 27-mm pericardial porcine valve (Medtronic Mosaic valve, serial #[id number]). 09/20/10, Dr Prescott Gum  . Left atrial maze procedure  09/20/10    for paroxysmal atrial fibrillation (Dr. Prescott Gum)  . Placement of left chest tube.  09/24/2010    Dr Prescott Gum  . Pr vein bypass graft,aorto-fem-pop  May 2012  . Cardiac valve replacement  Aug. 2012  . Colonoscopy  05/12/2011    performed by Dr. Michail Sermon. Showing small internal hemorrhoids, single tubular adenoma polyp  . Esophagogastroduodenoscopy  05/12/2011    performed by Dr. Michail Sermon. Negative for ulcerations, biopsy negative for evidence of celiac sprue  . Tubal ligation     Prior to Admission medications   Medication Sig Start Date End Date Taking? Authorizing Provider  ACCU-CHEK FASTCLIX LANCETS MISC 1 each by Does not apply route 3 (three) times daily before meals. Check blood sugars 3 times daily before breakfast, lunch, and dinner DX code: 250.02 10/23/11   Maitri S Kalia-Reynolds, DO  albuterol (PROVENTIL HFA;VENTOLIN HFA) 108 (90 BASE) MCG/ACT inhaler Inhale 2 puffs into the lungs every 6 (six) hours as needed. For wheeze or shortness of breath 12/18/11   Maitri S Kalia-Reynolds, DO  alendronate (FOSAMAX) 70 MG tablet Take 70 mg by mouth every 7 (seven) days. Takes on [place] Provider, MD  ALPRAZolam [name]) 1 MG tablet Take 1 tablet (1 mg total) by mouth 3 (three) times daily as needed for sleep or anxiety. 04/30/12   Maitri S Kalia-Reynolds, DO  aspirin EC 81 MG tablet Take 81 mg by mouth every evening.    Historical Provider, MD  azithromycin (ZITHROMAX) 250 MG tablet Take 1 tablet (250 mg total) by mouth daily. Take first 2 tablets  together, then 1 every day until finished. 05/03/12   Heather Lucianne Lei Wingen, PA-C  buPROPion (WELLBUTRIN XL) 150 MG 24 hr tablet Take 1 tablet (150 mg total) by mouth daily with supper. 12/18/11   Maitri S Kalia-Reynolds, DO  calcium carbonate (OS-CAL) 600 MG TABS Take 1,200 mg by mouth daily.    Historical Provider, MD  docusate sodium (COLACE) 50 MG capsule Take 100 mg by mouth every morning. Can Alternate with  senokot    Historical Provider, MD  DULoxetine (CYMBALTA) 30 MG capsule Take 90 mg by mouth daily with breakfast.  07/30/11   Annamarie Dawley, DO  Elastic Bandages & Supports (TRUFORM STOCKINGS 20-30MMHG) MISC Wear compression stockings throughout the day, and take off at nighttime. 05/29/11   Maitri S Kalia-Reynolds, DO  ferrous gluconate (FERGON) 324 MG tablet Take 1 tablet (324 mg total) by mouth  2 (two) times daily with a meal. 05/13/11 05/12/12  Hans Eden, MD  Fluticasone-Salmeterol (ADVAIR DISKUS) 250-50 MCG/DOSE AEPB Inhale 1 puff into the lungs every 12 (twelve) hours. 12/18/11   Maitri S Kalia-Reynolds, DO  furosemide (LASIX) 80 MG tablet Take 80 mg by mouth 2 (two) times daily.    Historical Provider, MD  gabapentin (NEURONTIN) 300 MG capsule Take 300 mg by mouth 2 (two) times daily.    Historical Provider, MD  glucose blood (BAYER CONTOUR TEST) test strip Use to test blood sugar 3 times daily.Dx code 250.00 09/15/11   Bartholomew Crews, MD  HYDROcodone-acetaminophen (NORCO/VICODIN) 5-325 MG per tablet Take 1-2 tablets by mouth every 6 (six) hours as needed for pain.    Historical Provider, MD  insulin aspart (NOVOLOG FLEXPEN) 100 UNIT/ML injection Inject 13-21 Units into the skin See admin instructions. Takes 13 units with meals and also uses SSI with meals in addition to the 13 units of insulin    Historical Provider, MD  insulin detemir (LEVEMIR) 100 UNIT/ML injection Inject 60 Units into the skin at bedtime. 03/30/12   Maitri S Kalia-Reynolds, DO  Insulin Pen Needle (B-D UF III  MINI PEN NEEDLES) 31G X 5 MM MISC DX: 250.02 Please check blood sugars 3 times daily. 01/13/12   Maitri S Kalia-Reynolds, DO  Insulin Syringe-Needle U-100 (INSULIN SYRINGE .3CC/31GX5/16") 31G X 5/16" 0.3 ML MISC 1 Syringe by Does not apply route 2 (two) times daily at 10 AM and 5 PM. 08/20/11   Milta Deiters, MD  lisinopril (PRINIVIL,ZESTRIL) 10 MG tablet Take 1 tablet (10 mg total) by mouth daily. 04/30/12 04/30/13  Maitri S Kalia-Reynolds, DO  loratadine (CLARITIN) 10 MG tablet Take 10 mg by mouth daily with supper. 03/14/11   Maitri S Kalia-Reynolds, DO  nystatin (MYCOSTATIN/NYSTOP) 100000 UNIT/GM POWD Apply 1 g topically daily as needed (; apply to stomach and under vreasts).    Historical Provider, MD  omeprazole (PRILOSEC) 40 MG capsule Take 1 capsule (40 mg total) by mouth 2 (two) times daily. 03/02/12   Maitri S Kalia-Reynolds, DO  potassium chloride SA (K-DUR,KLOR-CON) 20 MEQ tablet Take 20-40 mEq by mouth 2 (two) times daily. 2 tablets in the  Morning and 1 tablet at night    Historical Provider, MD  rosuvastatin (CRESTOR) 20 MG tablet Take 1 tablet (20 mg total) by mouth daily with supper. 12/11/11   Maitri S Kalia-Reynolds, DO  sennosides-docusate sodium (SENOKOT-S) 8.6-50 MG tablet Take 2 tablets by mouth every other day as needed for constipation. Hold for loose stools 05/29/11 05/28/12  Maitri S Kalia-Reynolds, DO  Spacer/Aero-Holding Chambers (AEROCHAMBER MV) inhaler Use as instructed with inhalers. 04/26/12   Maitri S Kalia-Reynolds, DO  tiotropium (SPIRIVA) 18 MCG inhalation capsule Place 1 capsule (18 mcg total) into inhaler and inhale daily with breakfast. 09/23/11   Karren Cobble, MD   Allergies  Allergen Reactions  . Lorazepam     Patient's sister noted that ativan caused the patient to become extremely confused during hospitalization 09/2010  . Morphine     REACTION: Unknown reaction  . Oxycontin (Oxycodone) Other (See Comments)    headache  . Tramadol Hcl     REACTION: swelling     FAMILY HISTORY:  Family History  Problem Relation Age of Onset  . Heart disease Father 24    died to MI at 84yo  . Heart attack Father   . Heart disease Brother 54    died of MI at 80yo  . Heart  attack Brother    SOCIAL HISTORY:  reports that she has been smoking Cigarettes.  She has a 50 pack-year smoking history. She has never used smokeless tobacco. She reports that she does not drink alcohol or use illicit drugs.  REVIEW OF SYSTEMS:  Patient unable to speak clearly with BiPAP.  SUBJECTIVE: Reports having difficulty breathing but not ready for intubation.  VITAL SIGNS: Temp:  [98 F (36.7 C)-99.2 F (37.3 C)] 99.2 F (37.3 C) (03/25 0417) Pulse Rate:  [93-115] 100 (03/25 1000) Resp:  [16-28] 21 (03/25 1000) BP: (102-157)/(48-79) 142/71 mmHg (03/25 1000) SpO2:  [90 %-100 %] 96 % (03/25 1000) FiO2 (%):  [30 %-60 %] 30 % (03/25 1000) Weight:  [83.7 kg (184 lb 8.4 oz)] 83.7 kg (184 lb 8.4 oz) (03/25 0432) HEMODYNAMICS:   VENTILATOR SETTINGS: Vent Mode:  [-] BIPAP FiO2 (%):  [30 %-60 %] 30 % Set Rate:  [10 bmp] 10 bmp INTAKE / OUTPUT: Intake/Output     03/24 0701 - 03/25 0700 03/25 0701 - 03/26 0700   P.O.  50   IV Piggyback 150    Total Intake(mL/kg) 150 (1.8) 50 (0.6)   Net +150 +50         PHYSICAL EXAMINATION: General:  Chronically ill appearing obese female.  On BiPAP. Neuro:  Awake but not speaking clearly, following all commands, moving ext to commands. HEENT:  [place]/AT, PERRL, EOM-I, unable to assess JVD. Cardiovascular:  RRR, Nl S1/S2, -M/R/G. Lungs:  Decreased BS diffusely. Abdomen:  Soft, NT, ND, obese and +BS. Musculoskeletal:  1+ edema and -tenderness. Skin:  Thin but intact.  LABS:  Recent Labs Lab 05/03/12 1104 05/04/12 0021 05/04/12 0035 05/04/12 0511 05/04/12 0552  HGB 13.7 13.2 13.9 13.0  --   WBC 7.1 7.7  --  8.4  --   PLT 175 175  --  166  --   NA 139  --  139 137  --   K 4.9  --  3.7 4.1  --   CL 99  --  100 98  --   CO2 31  --    --  27  --   GLUCOSE 216*  --  288* 287*  --   BUN 26*  --  32* 29*  --   CREATININE 1.16*  --  1.20* 1.12*  --   CALCIUM 10.2  --   --  9.1  --   AST 16  --   --  18  --   ALT 17  --   --  17  --   ALKPHOS 63  --   --  58  --   BILITOT 0.3  --   --  0.2*  --   PROT 7.3  --   --  7.1  --   ALBUMIN 3.8  --   --  3.7  --   TROPONINI  --   --   --  <0.30  --   PROBNP  --   --   --  1219.0*  --   PHART  --   --   --   --  7.439  PCO2ART  --   --   --   --  40.7  PO2ART  --   --   --   --  148.0*   Recent Labs Lab 05/04/12 0412 05/04/12 0805  GLUCAP 294* 363*   CXR: COPD and pulmonary edema.  ASSESSMENT / PLAN:  PULMONARY A: VDRF due to COPD,  pulmonary edema and evidence of bronchitis. P:   - Maintain on BiPAP. - Anticipate will need intubation in the next 24 hours. - Steroids. - Levofloxacin. - See ID section. - Bronchodilators. - Diureses as below.  CARDIOVASCULAR A: Grade two diastolic dysfunction with pulmonary HTN. P:  - Diurese. - Check 2D echo. - KVO IVF. - Monitor rhythem.  RENAL A:  Stable. P:   - Diureses and K replacement as ordered. - Monitor lytes.  GASTROINTESTINAL A:  No active issues. P:   - NPO for now. - IF intubated will start TF after placement of OGT.  HEMATOLOGIC A:  No active issues. P:  - DVT prophylaxis via SQ heparin.  INFECTIOUS A:  Bronchitis. P:   - Pan culture. - Levofloxacin.  ENDOCRINE A:  DM.   P:   - ISS. - Hold lantus.  NEUROLOGIC A:  Lethargic due to respiratory failure. P:   - Monitor.  I have personally obtained a history, examined the patient, evaluated laboratory and imaging results, formulated the assessment and plan and placed orders.  CRITICAL CARE: The patient is critically ill with multiple organ systems failure and requires high complexity decision making for assessment and support, frequent evaluation and titration of therapies, application of advanced monitoring technologies and extensive  interpretation of multiple databases. Critical Care Time devoted to patient care services described in this note is 45 minutes.   Rush Farmer, M.D. [facility] Pulmonary/Critical Care Medicine. Pager: [phone]. After hours pager: [phone].

## 2012-05-04 NOTE — Progress Notes (Signed)
Medical Student Daily Progress Note  Subjective: Overnight [name] had no acute events.  Pt tired on AM rounds but does report breathing improved.  Per nursing, has been taken off BiPAP for 5 minute increments (placed on 3L [place]) and does not desaturate.    Objective: Vital signs in last 24 hours: Filed Vitals:   05/04/12 0500 05/04/12 0600 05/04/12 0622 05/04/12 0700  BP: 107/57 109/57 109/57 112/56  Pulse: 105 95 94 94  Temp:      TempSrc:      Resp: _0 Weight:      SpO2: 98% 95% 96% 93%   Weight change:   Intake/Output Summary (Last 24 hours) at 05/04/12 0838 Last data filed at 05/04/12 0250  Gross per 24 hour  Intake    150 ml  Output      0 ml  Net    150 ml   Physical Exam: BP 112/56  Pulse 94  Temp(Src) 99.2 F (37.3 C) (Axillary)  Resp 27  Wt 83.7 kg (184 lb 8.4 oz)  BMI 33.74 kg/m2  SpO2 94%  General Appearance:    Tired appearing female, on BiPAP but in no acute distress.  Alert and oriented x3  Head:    Normocephalic, atraumatic  Eyes:    PERRL, pt only opens eyes momentarily so unable to check EOM  Lungs:     Course breath sounds heard throughout lung fields.  No use of accessory muscles  Chest Wall:    No tenderness to palpation   Heart:   borderline tachycardia, regular rhythm, S1 and S2 normal, no m/r/g  Abdomen:     Active bowel sounds, no tenderness to palpation  Extremities:   No LE edema  Neurologic:   CNII-XII grossly intact   Lab Results: CBC    Component Value Date/Time   WBC 8.4 05/04/2012 0511   RBC 4.12 05/04/2012 0511   HGB 13.0 05/04/2012 0511   HCT 37.9 05/04/2012 0511   PLT 166 05/04/2012 0511   MCV 92.0 05/04/2012 0511   MCH 31.6 05/04/2012 0511   MCHC 34.3 05/04/2012 0511   RDW 14.4 05/04/2012 0511   LYMPHSABS 1.0 05/04/2012 0021   MONOABS 1.1* 05/04/2012 0021   EOSABS 0.0 05/04/2012 0021   BASOSABS 0.0 05/04/2012 0021   BMET    Component Value Date/Time   NA 137 05/04/2012 0511   K 4.1 05/04/2012 0511   CL 98 05/04/2012  0511   CO2 27 05/04/2012 0511   GLUCOSE 287* 05/04/2012 0511   BUN 29* 05/04/2012 0511   CREATININE 1.12* 05/04/2012 0511   CREATININE 0.94 11/25/2011 1054   CALCIUM 9.1 05/04/2012 0511   GFRNONAA 51* 05/04/2012 0511   GFRAA 59* 05/04/2012 0511   Arterial Blood gas (0552) PH: 7.44 PCO2: 40.7 PO2: 148 Bicarb: 27.6   Micro Results:  MRSA PCR screen:  Neg  Studies/Results:  Dg Chest Port 1 View   IMPRESSION: Peribronchial thickening and increased interstitial markings likely reflecting bronchitis.  Cannot exclude mild interstitial edema.     Medications: I have reviewed the patient's current medications. Scheduled Meds: . antiseptic oral rinse  15 mL Mouth Rinse q12n4p  . aspirin EC  81 mg Oral QPM  . atorvastatin  10 mg Oral q1800  . buPROPion  150 mg Oral Q supper  . calcium carbonate  1 tablet Oral Q breakfast  . chlorhexidine  15 mL Mouth Rinse BID  . docusate sodium  100 mg Oral q morning -  10a  . DULoxetine  90 mg Oral Q breakfast  . ferrous gluconate  324 mg Oral BID WC  . furosemide  80 mg Oral BID  . gabapentin  300 mg Oral BID  . heparin  5,000 Units Subcutaneous Q8H  . insulin aspart  0-9 Units Subcutaneous Q4H  . insulin detemir  20 Units Subcutaneous QHS  . ipratropium  0.5 mg Nebulization Q4H  . levalbuterol  0.63 mg Nebulization Q4H  . levofloxacin (LEVAQUIN) IV  750 mg Intravenous Q24H  . loratadine  10 mg Oral Q supper  . methylPREDNISolone (SOLU-MEDROL) injection  125 mg Intravenous Q12H  . mometasone-formoterol  2 puff Inhalation BID  . pantoprazole  40 mg Oral Daily  . potassium chloride SA  20 mEq Oral QHS  . potassium chloride  40 mEq Oral Daily  . sodium chloride  3 mL Intravenous Q12H  . sodium chloride  3 mL Intravenous Q12H   Continuous Infusions:  PRN Meds:.sodium chloride, acetaminophen, acetaminophen, albuterol, ALPRAZolam, alum & mag hydroxide-simeth, nystatin, ondansetron (ZOFRAN) IV, ondansetron, pneumococcal 23 valent vaccine, sodium  chloride Assessment/Plan: [name] is a 62yo female with history of COPD (on home O2), CHF (diastolic dysfunction), DM, and depression who was admitted on 3/24 for SOB and chest tightness.  SOB/Resp Distress.  Given history and symptoms, the most likely etiology of SOB/dyspnea is a COPD exacerbation.  Possibly precipitated by URI and sinus drainage given productive cough over past several days.  Afebrile, WBC wnl at admission.  Troponin neg x1, EKG shows no ST changes, BNP 1219 (last checked 2093 in 09/2010 after Maze procedure).  CXR shows likely bronchitis and possible mild interstitial edema. Currently saturating well on Bipap and in no acute distress.  Most recent ABG shows pH 7.44, pCO2 40.7. -Try transition from Bipap to [place] (on 1-2L O2 at home) when pt less somnolent- pt has tolerated this for short periods overnight. -Levaquin 722m x5 days - Methylprednisolone 1241mbid, consider transition to oral steroids when tolerating PO -Duonebs q4 hrs - Albuterol inhaler prn - inhaled corticosteroid (mometasone-formoterol) bid - Will check pneumococcal vaccine status and provide prior to discharge if needed - Pt scheduled for pulmonary rehab- will ensure pt aware  Diabetes.  Glucose at time of admission is 288.  Pt is NPO currently and is on steroids- will monitor glucose closely.  Pt did not get basal insulin last night and sugars elevated this AM (363) -Decreasing home insulin (Levemir 60U qhs) while NPO- will give Levemir 20U qhs -SSI with CBG monitoring q4N8MVEDiastolic CHF.  Pt has multiple cardiac/vascular issues, including mitral stenosis (s/p mitral valve replacement 2008), Hx of Afib (s/p Maze procedure in 2012), pulmonary hypertension.  Last echo shows EF 55-65% (2012) with grade 2 diastolic dysfunction.  BNP at admission 1219. -Asprin 8167mContinue home lasix (6m41md) -Strict I/O -Holding IVF  Depression/Anxiety. Discussed with PCP (Dr. KaliGuy Sandiferikely multifactorial due  to comorbidity burden and poor social support. -Continue home meds: -Xanex 1mg 59m -Duloxetine 90mg 26m-buproprion 140mg q23mExplore social support options with pt (PACE program)  HTN.  Overnight BP relatively well-controlled (110-150s/50s-80s) -Holding home lisinopril for now (10mg qd78mgiven slight increase in Cr overnight -Continue home lasix (6mg bid44motassium replacement- continue home regimen 40meq qam25m 20meq qhs 71m -Continue home meds- Atorvastain 10mg qd  Fi52myalgia -Continue home meds- Gabapentin 300mg bid -Pa68montrol- pt takes hydrocodone 5-325 prn at home  OSA- pt uses CPAP at home -Continue CPAP when  no longer requiring Bipap.  GERD  -Takes omeprazole 24m bid, will give Protonix 468mqd while NPO  Smoking- Pt quit smoking in 2012 but has since re-started smoking in 2013.  It is possible that depression/life stressors play a role in this.  This is particularly concerning in the setting of home oxygen use. -Discuss smoking cessation options with pt  DVT prophylaxis:  Heparin    LOS: 0 days   This is a [employer] information officerote.  The care of the patient was discussed with Dr. ShBurnard Buntingnd the assessment and plan formulated with their assistance.  Please see their attached note for official documentation of the daily encounter.  PeSherlon Handing/25/2014, 8:38 AM

## 2012-05-04 NOTE — Progress Notes (Signed)
UR Completed.  Vergie Living [phone] 05/04/2012

## 2012-05-04 NOTE — Progress Notes (Signed)
Inpatient Diabetes Program Recommendations  AACE/ADA: New Consensus Statement on Inpatient Glycemic Control (2013)  Target Ranges:  Prepandial:   less than 140 mg/dL      Peak postprandial:   less than 180 mg/dL (1-2 hours)      Critically ill patients:  140 - 180 mg/dL   Results for JOEE, IOVINE (MRN [id number]) as of 05/04/2012 09:24  Ref. Range 05/03/2012 11:04 05/04/2012 00:35 05/04/2012 05:11  Glucose Latest Range: 70-99 mg/dL 216 (H) 288 (H) 287 (H)  Results for SANAYA, GWILLIAM (MRN [id number]) as of 05/04/2012 09:24  Ref. Range 05/04/2012 04:12 05/04/2012 08:05  Glucose-Capillary Latest Range: 70-99 mg/dL 294 (H) 363 (H)    Inpatient Diabetes Program Recommendations Insulin - Basal: Please consider changing the time Levemir is given.  Please change to Levemir 20 units daily.  Note: Patient has not received any basal insulin since arriving at the hospital yesterday around noon.  Levemir was not ordered until 3/25 @ 4:08am.  Therefore, that patient has not received any basal insulin yet and is not scheduled to receive any until tonight at bedtime.  Please consider changing Levemir 20 units to daily (instead of QHS).  Patient will likely need to more basal insulin especially since she is on steroids.  Will continue to follow.  Thanks, Barnie Alderman, RN, BSN, [place] Diabetes Coordinator Inpatient Diabetes Program [phone]

## 2012-05-05 ENCOUNTER — Inpatient Hospital Stay (HOSPITAL_COMMUNITY): Payer: Medicare Other

## 2012-05-05 DIAGNOSIS — G4733 Obstructive sleep apnea (adult) (pediatric): Secondary | ICD-10-CM

## 2012-05-05 LAB — BASIC METABOLIC PANEL
BUN: 36 mg/dL — ABNORMAL HIGH (ref 6–23)
CO2: 30 mEq/L (ref 19–32)
Calcium: 9.1 mg/dL (ref 8.4–10.5)
Chloride: 97 mEq/L (ref 96–112)
Creatinine, Ser: 1.28 mg/dL — ABNORMAL HIGH (ref 0.50–1.10)
GFR calc Af Amer: 50 mL/min — ABNORMAL LOW (ref >90)
GFR calc non Af Amer: 44 mL/min — ABNORMAL LOW (ref >90)
Glucose, Bld: 171 mg/dL — ABNORMAL HIGH (ref 70–99)
Potassium: 4.4 mEq/L (ref 3.5–5.1)
Sodium: 137 mEq/L (ref 135–145)

## 2012-05-05 LAB — PHOSPHORUS: Phosphorus: 3 mg/dL (ref 2.3–4.6)

## 2012-05-05 LAB — GLUCOSE, CAPILLARY
Glucose-Capillary: 210 mg/dL — ABNORMAL HIGH (ref 70–99)
Glucose-Capillary: 165 mg/dL — ABNORMAL HIGH (ref 70–99)
Glucose-Capillary: 165 mg/dL — ABNORMAL HIGH (ref 70–99)
Glucose-Capillary: 171 mg/dL — ABNORMAL HIGH (ref 70–99)
Glucose-Capillary: 158 mg/dL — ABNORMAL HIGH (ref 70–99)
Glucose-Capillary: 141 mg/dL — ABNORMAL HIGH (ref 70–99)
Glucose-Capillary: 145 mg/dL — ABNORMAL HIGH (ref 70–99)
Glucose-Capillary: 372 mg/dL — ABNORMAL HIGH (ref 70–99)
Glucose-Capillary: 326 mg/dL — ABNORMAL HIGH (ref 70–99)
Glucose-Capillary: 308 mg/dL — ABNORMAL HIGH (ref 70–99)

## 2012-05-05 LAB — CBC
HCT: 38.2 % (ref 36.0–46.0)
Hemoglobin: 12.7 g/dL (ref 12.0–15.0)
MCH: 31.6 pg (ref 26.0–34.0)
MCHC: 33.2 g/dL (ref 30.0–36.0)
MCV: 95 fL (ref 78.0–100.0)
Platelets: 201 10*3/uL (ref 150–400)
RBC: 4.02 MIL/uL (ref 3.87–5.11)
RDW: 14.3 % (ref 11.5–15.5)
WBC: 7 10*3/uL (ref 4.0–10.5)

## 2012-05-05 LAB — URINE CULTURE
Colony Count: NO GROWTH
Culture: NO GROWTH

## 2012-05-05 LAB — MAGNESIUM: Magnesium: 2.2 mg/dL (ref 1.5–2.5)

## 2012-05-05 MED ORDER — INSULIN DETEMIR 100 UNIT/ML ~~LOC~~ SOLN
60.0000 [IU] | Freq: Every day | SUBCUTANEOUS | Status: DC
  Administered 2012-05-05 – 2012-05-06 (×2): 60 [IU] via SUBCUTANEOUS
  Filled 2012-05-05 (×4): qty 0.6

## 2012-05-05 MED ORDER — HYDROCODONE-ACETAMINOPHEN 5-325 MG PO TABS
1.0000 | ORAL_TABLET | Freq: Once | ORAL | Status: AC
  Administered 2012-05-05: 1 via ORAL
  Filled 2012-05-05: qty 1

## 2012-05-05 MED ORDER — INSULIN DETEMIR 100 UNIT/ML ~~LOC~~ SOLN
40.0000 [IU] | Freq: Every day | SUBCUTANEOUS | Status: DC
  Filled 2012-05-05: qty 0.4

## 2012-05-05 MED ORDER — INSULIN ASPART 100 UNIT/ML ~~LOC~~ SOLN
6.0000 [IU] | Freq: Three times a day (TID) | SUBCUTANEOUS | Status: DC
  Administered 2012-05-05 – 2012-05-06 (×3): 6 [IU] via SUBCUTANEOUS

## 2012-05-05 MED ORDER — INSULIN ASPART 100 UNIT/ML ~~LOC~~ SOLN
0.0000 [IU] | Freq: Three times a day (TID) | SUBCUTANEOUS | Status: DC
  Administered 2012-05-05: 15 [IU] via SUBCUTANEOUS
  Administered 2012-05-05: 20 [IU] via SUBCUTANEOUS
  Administered 2012-05-06: 4 [IU] via SUBCUTANEOUS
  Administered 2012-05-06: 20 [IU] via SUBCUTANEOUS
  Administered 2012-05-06: 11 [IU] via SUBCUTANEOUS
  Administered 2012-05-07: 7 [IU] via SUBCUTANEOUS

## 2012-05-05 MED ORDER — NICOTINE 14 MG/24HR TD PT24
14.0000 mg | MEDICATED_PATCH | Freq: Every day | TRANSDERMAL | Status: DC
  Administered 2012-05-05 – 2012-05-07 (×3): 14 mg via TRANSDERMAL
  Filled 2012-05-05 (×3): qty 1

## 2012-05-05 MED ORDER — FUROSEMIDE 80 MG PO TABS
80.0000 mg | ORAL_TABLET | Freq: Two times a day (BID) | ORAL | Status: DC
  Administered 2012-05-06: 80 mg via ORAL
  Filled 2012-05-05 (×3): qty 1

## 2012-05-05 MED ORDER — INSULIN DETEMIR 100 UNIT/ML ~~LOC~~ SOLN: 30.0000 [IU] | Freq: Every day | SUBCUTANEOUS | Status: DC

## 2012-05-05 MED ORDER — ALPRAZOLAM 0.5 MG PO TABS
0.5000 mg | ORAL_TABLET | Freq: Three times a day (TID) | ORAL | Status: DC | PRN
  Administered 2012-05-05 – 2012-05-06 (×3): 0.5 mg via ORAL
  Filled 2012-05-05 (×3): qty 1

## 2012-05-05 NOTE — Progress Notes (Signed)
Pt placed back on CPAP per pt request. RT will monitor.

## 2012-05-05 NOTE — Progress Notes (Signed)
Nursing reported that the pt was still c/o headache, was afraid she was going through narcotic withdrawal, and wanted her home Vicodin. The patient requested to speak to an MD. Once at the bedside, the patient again expressed that she has a headache and also neck pain. She requested her Vicodin and stated that she was afraid she was going through withdrawal with sweats and shaking.  PE: Tremulous, mildly diaphoretic, NAD RRR CTAB Abd nondistened, nontender Moves all 4 ext A&Ox3, nonfocal Tearful at times but redirectable  Given that the patient is tremulous, mildly diaphoretic, and continues to complain about a headache, and she has not has any narcotics in roughly 48hrs, her symptoms seem to be more related to withdrawal. Her vitals are all stable, she is completely alert and oriented, and she is saturating 99% on 4L O2, giving her Vicodin 5-337m po x1. She states that she will be unable to sleep without her Xanax, so in discussing the balance of making her comfortable but not overly sedated, we agreed that if she tolerated the Vicodin, she could have 0.519mpo Xanax tonight roughly 2 hours after receiving her Vicodin. The patient acknowledged her understanding and agreed.  - Vicodin 5-32525mo x1 NOW - Xanax 0.5mg52m around 12am.

## 2012-05-05 NOTE — Progress Notes (Signed)
I have reviewed progress note by Cheral Almas, MSIV. Please see my additional note below for further details:  Subjective:  No acute events overnight (patient's status improved and did not require intubation), but did experience HA through the night. Per night float, patient responded well to tylenol, and now has a nicotine patch.   Objective:  Vital signs in last 24 hours:  Filed Vitals:    05/05/12 0423  05/05/12 0600  05/05/12 0638  05/05/12 0649   BP:  117/68  117/46     Pulse:  83  81  80  74   Temp:  97.7 F (36.5 C)      TempSrc:  Oral      Resp:  _0 Weight:       SpO2:  95%  88%  89%  90%    Weight change:   Intake/Output Summary (Last 24 hours) at 05/05/12 0721 Last data filed at 05/05/12 0400   Gross per 24 hour   Intake  465.39 ml   Output  3050 ml   Net  -2584.61 ml    Vitals reviewed. Afebrile overnight. Saturating 90% on 4L [place]  General: resting in bed  HEENT: PERRL, EOMI, no scleral icterus  Cardiac: RRR, no rubs, murmurs or gallops  Pulm: soft b/l expiratory wheezing, no use of accessory muscles Abd: soft, nontender, nondistended, BS present  Ext: warm and well perfused, no pedal edema  Neuro: alert and oriented X3, cranial nerves II-XII grossly intact   Lab Results:  Basic Metabolic Panel:   Recent Labs  Lab  05/04/12 0511  05/05/12 0420   NA  137  137   K  4.1  4.4   CL  98  97   CO2  27  30   GLUCOSE  287*  171*   BUN  29*  36*   CREATININE  1.12*  1.28*   CALCIUM  9.1  9.1   MG  --  2.2   PHOS  --  3.0    Liver Function Tests:   Recent Labs  Lab  05/03/12 1104  05/04/12 0511   AST  16  18   ALT  17  17   ALKPHOS  63  58   BILITOT  0.3  0.2*   PROT  7.3  7.1   ALBUMIN  3.8  3.7    CBC:   Recent Labs  Lab  05/03/12 1104  05/04/12 0021   05/04/12 0511  05/05/12 0420   WBC  7.1  7.7  --  8.4  7.0   NEUTROABS  4.8  5.6  --  --  --   HGB  13.7  13.2  < >  13.0  12.7   HCT  39.9  38.6  < >  37.9  38.2   MCV   92.8  91.9  --  92.0  95.0   PLT  175  175  --  166  201   < > = values in this interval not displayed.  Cardiac Enzymes:   Recent Labs  Lab  05/04/12 0511   TROPONINI  <0.30    BNP:   Recent Labs  Lab  05/04/12 0511   PROBNP  1219.0*    CBG:   Recent Labs  Lab  05/05/12 0121  05/05/12 0214  05/05/12 0319  05/05/12 0414  05/05/12 0537  05/05/12 0655   GLUCAP  210*  165*  165*  171*  158*  141*    Hemoglobin A1C:   Recent Labs  Lab  05/04/12 0511   HGBA1C  8.4*    Micro Results:  Recent Results (from the past 240 hour(s))   MRSA PCR SCREENING Status: None    Collection Time    05/04/12 6:05 AM   Result  Value  Range  Status    MRSA by PCR  NEGATIVE  NEGATIVE  Final    Comment:      The GeneXpert MRSA Assay (FDA     approved for NASAL specimens     only), is one component of a     comprehensive MRSA colonization     surveillance program. It is not     intended to diagnose MRSA     infection nor to guide or     monitor treatment for     MRSA infections.   CULTURE, EXPECTORATED SPUTUM-ASSESSMENT Status: None    Collection Time    05/04/12 1:30 PM   Result  Value  Range  Status    Specimen Description  SPUTUM   Final    Special Requests  NONE   Final    Sputum evaluation    Final    Value:  THIS SPECIMEN IS ACCEPTABLE. RESPIRATORY CULTURE REPORT TO FOLLOW.    Report Status  05/04/2012 FINAL   Final    Studies/Results:  Dg Chest Port 1 View  05/05/2012 *RADIOLOGY REPORT* Clinical Data: Respiratory failure. Short of breath. PORTABLE CHEST - 1 VIEW Comparison: 05/04/2012. Findings: Median sternotomy / CABG. Pulmonary aeration appears little changed. Unchanged increased interstitial markings. Right clavicle plating. Bioprosthetic mitral valve replacement. IMPRESSION: No interval change. Original Report Authenticated By: Dereck Ligas, M.D.   Dg Chest Port 1 View  05/04/2012 IMPRESSION: Peribronchial thickening and increased interstitial markings likely  reflecting bronchitis. Cannot exclude mild interstitial edema. Original Report Authenticated By: Marijo Sanes, M.D.   Dg Chest 2 View  05/03/2012 IMPRESSION: No acute chest findings. Original Report Authenticated By: Markus Daft, M.D.   Medications: I have reviewed the patient's current medications.  Scheduled Meds:  .  antiseptic oral rinse  15 mL  Mouth Rinse  q12n4p   .  aspirin EC  81 mg  Oral  QPM   .  atorvastatin  10 mg  Oral  q1800   .  buPROPion  150 mg  Oral  Q supper   .  calcium carbonate  1 tablet  Oral  Q breakfast   .  chlorhexidine  15 mL  Mouth Rinse  BID   .  docusate sodium  100 mg  Oral  q morning - 10a   .  DULoxetine  90 mg  Oral  Q breakfast   .  ferrous gluconate  324 mg  Oral  BID WC   .  gabapentin  300 mg  Oral  BID   .  heparin  5,000 Units  Subcutaneous  Q8H   .  ibuprofen  400 mg  Oral  Once   .  insulin detemir  20 Units  Subcutaneous  QHS   .  insulin glargine  10 Units  Subcutaneous  NOW   .  ipratropium  0.5 mg  Nebulization  Q4H   .  levalbuterol  0.63 mg  Nebulization  Q4H   .  levofloxacin (LEVAQUIN) IV  750 mg  Intravenous  Q24H   .  loratadine  10 mg  Oral  Q supper   .  methylPREDNISolone (  SOLU-MEDROL) injection  60 mg  Intravenous  Q6H   .  mometasone-formoterol  2 puff  Inhalation  BID   .  nicotine  14 mg  Transdermal  Daily   .  pantoprazole  40 mg  Oral  Daily   .  potassium chloride SA  20 mEq  Oral  QHS   .  potassium chloride  40 mEq  Oral  Daily   .  sodium chloride  3 mL  Intravenous  Q12H   .  sodium chloride  3 mL  Intravenous  Q12H    Continuous Infusions:  .  insulin (NOVOLIN-R) infusion  4.9 Units/hr (05/05/12 0656)    PRN Meds:.sodium chloride, acetaminophen, acetaminophen, albuterol, ALPRAZolam, alum & mag hydroxide-simeth, nystatin, ondansetron (ZOFRAN) IV, ondansetron, sodium chloride    Assessment/Plan:  62 yo F with history of COPD on home O2, HTN, HLD, DM, pulm HTN, OSA, Afib s/p left atrial maze procedure (no  anticoag given fall risk) presented on 05/04/12 with acute respiratory failure.   #Acute Resp failure secondary to COPD exacerbation: Improved this morning, saturating 100% on ventimask, and later 98% on 4L [place]  -Continue to monitor in step down this morning, consider transfer to tele later this afternoon or tomorrow  -Goal O2 sat 90-92% (given COPD, on 1L at home)  -continue IV steroids & Levaquin (day 2, started 05/04/12) --> plan to transition to PO meds tomorrow (prednisone 40, levaquin PO)  -Continue duonebs scheduled & xopenex prn & dulera   #Diastolic CHF: Mild Cr bump this morning, but was given Lasix 161m IV and 852mPO yesterday; Foley now placed yesterday (net -2.4L since admission); physical exam and labs (pBNP 1219) less concerning for CHF exacerbation, and goal is to keep lungs dry  -Repeat echo --> EF 60-65%, and technically difficult study  -Continue home dose lasix tomorrow (80 PO BID) - will hold diuresis today  -Monitor i/o  -AM labs: bmet   #DM: A1c 8 in 01/2012. Home regimen includes Levemir 60 qHS and SSI; CBGs improved this morning.  -Escalate to Levemir 40u qHS and resistant SSI since diet restarted (on insulin drip per ICU protocol overnight)   #HTN: BP improved today  -Lisinopril held given mildly elevated Cr  -Continue lasix tomorrow   #Fibromyalgia: likely contributing to HA; home vicodin held given lethargy  -Will consider restarting vicodin later this afternoon after monitoring mental status   #Anxiety/Depression: Will plan to decrease xanex to 0.61m961mID prn since 1mg861mD seems to be contributing to acute resp failure and makes assessment difficult   #Tobacco abuse: Abrupt cessation likely contributing to HA  -Nicotine patch started.   #VTE ppx: heparin   -->Remainder of chronic issues per KeviCheral AlmasV's note    Dispo: Disposition is deferred at this time, awaiting improvement of current medical problems. Anticipated discharge in approximately  1-2 day(s).  The patient does have a current PCP (KALIA-REYNOLDS, SHELLY, DO), therefore will be requiring OPC follow-up after discharge.  The patient does not know have transportation limitations that hinder transportation to clinic appointments.   .Services Needed at time of discharge: Y = Yes, Blank = No  PT:    OT:    RN:    Equipment:    Other:    LOS: 1 day  SHARDA, NEEMA  05/05/2012, 7:21 AM

## 2012-05-05 NOTE — Progress Notes (Signed)
Called by nursing regarding a headache. Pt refused Ibuprofen but nursing stated Tylenol did seem to help improve her headache and the patient was able to go back to sleep until she was awoken by phlebotomy. Pt requesting home Vicodin, but per nursing pt became very drowsy with home Xanax and given her previous respiratory state, i did not think adding her home narcotics was a good idea. The nurse gave her Tylenol. Per nursing, the patient felt that her headache was from nicotine withdrawal or from narcotic withdrawal. Pt requested a nicotine patch which was given, as she has no documented history of blood clots. Per nursing the patient was doing well otherwise and had no deficits.  Addendum:  I called the nurse back around 6:45am to inquire about the patient. She said the patient was sleeping and was doing well with no further complaints.

## 2012-05-05 NOTE — Progress Notes (Signed)
C/o headache off and on this shift. Med x 1 with tylenol 626m po.

## 2012-05-05 NOTE — Progress Notes (Signed)
Medical Student Daily Progress Note  Subjective: Overnight [name] had no acute events.  Yesterday, decision was made not to intubate given respiratory improvement.  No SOB/chest pain, saturating well on [place].  Reports HA on AM rounds, not improved with tylenol.    Objective: Vital signs in last 24 hours: Filed Vitals:   05/05/12 0263 05/05/12 0649 05/05/12 0746 05/05/12 0754  BP:    120/62  Pulse: 80 74  75  Temp:    97.3 F (36.3 C)  TempSrc:    Axillary  Resp: _0 Weight:      SpO2: 89% 90% 93% 90%   Weight change:   Intake/Output Summary (Last 24 hours) at 05/05/12 1038 Last data filed at 05/05/12 0400  Gross per 24 hour  Intake 415.39 ml  Output   2800 ml  Net -2384.61 ml   Physical Exam: BP 120/62  Pulse 75  Temp(Src) 97.3 F (36.3 C) (Axillary)  Resp 20  Wt 83.7 kg (184 lb 8.4 oz)  BMI 33.74 kg/m2  SpO2 90%  General Appearance:    Tired appearing female, on [place] but in no acute distress.  Alert and oriented x3  Head:    Normocephalic, atraumatic  Eyes:    PERRL, EOMI  Lungs:     Lungs clear to auscultation- no wheezing/rales/rhonchi.  No use of accessory muscles  Chest Wall:    No tenderness to palpation   Heart:   borderline tachycardia, regular rhythm, S1 and S2 normal, no m/r/g  Abdomen:     Active bowel sounds, no tenderness to palpation  Extremities:   No LE edema  Neurologic:   CNII-XII grossly intact   Lab Results: CBC    Component Value Date/Time   WBC 7.0 05/05/2012 0420   WBC 9.0 01/26/2012 0851   RBC 4.02 05/05/2012 0420   RBC 3.86 01/26/2012 0851   HGB 12.7 05/05/2012 0420   HGB 12.4 01/26/2012 0851   HCT 38.2 05/05/2012 0420   HCT 36.5 01/26/2012 0851   PLT 201 05/05/2012 0420   PLT 199 01/26/2012 0851   MCV 95.0 05/05/2012 0420   MCV 94.5 01/26/2012 0851   MCH 31.6 05/05/2012 0420   MCH 32.2 01/26/2012 0851   MCHC 33.2 05/05/2012 0420   MCHC 34.1 01/26/2012 0851   RDW 14.3 05/05/2012 0420   RDW 16.0* 01/26/2012 0851   LYMPHSABS  1.0 05/04/2012 0021   LYMPHSABS 1.8 01/26/2012 0851   MONOABS 1.1* 05/04/2012 0021   MONOABS 0.9 01/26/2012 0851   EOSABS 0.0 05/04/2012 0021   EOSABS 0.2 01/26/2012 0851   BASOSABS 0.0 05/04/2012 0021   BASOSABS 0.0 01/26/2012 0851     BMET    Component Value Date/Time   NA 137 05/05/2012 0420   K 4.4 05/05/2012 0420   CL 97 05/05/2012 0420   CO2 30 05/05/2012 0420   GLUCOSE 171* 05/05/2012 0420   BUN 36* 05/05/2012 0420   CREATININE 1.28* 05/05/2012 0420   CREATININE 0.94 11/25/2011 1054   CALCIUM 9.1 05/05/2012 0420   GFRNONAA 44* 05/05/2012 0420   GFRAA 50* 05/05/2012 0420    Micro Results:  MRSA PCR screen:  Neg  Studies/Results:  Dg Chest Port 1 View   IMPRESSION: No interval change from previous CXR  Echocardiogram - Left ventricle: The cavity size was normal. Wall thickness was normal. Systolic function was normal. The estimated ejection fraction was in the range of 60% to 65%. There was an increased relative contribution of atrial contraction  to ventricular filling. - Mitral valve: Valve area by continuity equation (using LVOT flow): 1.09cm^2. - Left atrium: The atrium was mildly dilated.    Medications: I have reviewed the patient's current medications. Scheduled Meds: . antiseptic oral rinse  15 mL Mouth Rinse q12n4p  . aspirin EC  81 mg Oral QPM  . atorvastatin  10 mg Oral q1800  . buPROPion  150 mg Oral Q supper  . calcium carbonate  1 tablet Oral Q breakfast  . chlorhexidine  15 mL Mouth Rinse BID  . docusate sodium  100 mg Oral q morning - 10a  . DULoxetine  90 mg Oral Q breakfast  . ferrous gluconate  324 mg Oral BID WC  . [START ON 05/06/2012] furosemide  80 mg Oral BID  . gabapentin  300 mg Oral BID  . heparin  5,000 Units Subcutaneous Q8H  . ibuprofen  400 mg Oral Once  . insulin aspart  0-20 Units Subcutaneous TID WC  . insulin detemir  40 Units Subcutaneous QHS  . insulin glargine  10 Units Subcutaneous NOW  . ipratropium  0.5 mg Nebulization  Q4H  . levalbuterol  0.63 mg Nebulization Q4H  . levofloxacin (LEVAQUIN) IV  750 mg Intravenous Q24H  . loratadine  10 mg Oral Q supper  . methylPREDNISolone (SOLU-MEDROL) injection  60 mg Intravenous Q6H  . mometasone-formoterol  2 puff Inhalation BID  . nicotine  14 mg Transdermal Daily  . pantoprazole  40 mg Oral Daily  . sodium chloride  3 mL Intravenous Q12H  . sodium chloride  3 mL Intravenous Q12H   Continuous Infusions:  PRN Meds:.sodium chloride, acetaminophen, acetaminophen, albuterol, ALPRAZolam, alum & mag hydroxide-simeth, nystatin, ondansetron (ZOFRAN) IV, ondansetron, sodium chloride   Assessment/Plan: [name] is a 62yo female with history of COPD (on home O2), CHF (diastolic dysfunction), DM, and depression who was admitted on 3/24 for SOB and chest tightness.  SOB/Resp Distress.  Given history and symptoms, the most likely etiology of SOB/dyspnea is a COPD exacerbation.  Possibly precipitated by URI and sinus drainage given productive cough over past several days.  Afebrile, WBC wnl at admission.  Troponin neg x1, EKG shows no ST changes, BNP 1219 (last checked 2093 in 09/2010 after Maze procedure).  CXR shows likely bronchitis and possible mild interstitial edema. Intubation considered on 3/26, but respiratory effort improved.  Currently saturating well on [place] and in no acute distress. - Continue O2 (1L  while awake, which is home O2 requirement).  Keep O2 sats>90% as she likely has baseline low O2 sats. -Levaquin 791m x5 days (currently day 2) - Methylprednisolone 1245mbid, consider transition to oral steroids tomrrow -Duonebs q4 hrs, Albuterol inhaler prn, inhaled corticosteroid (mometasone-formoterol) bid - Pt scheduled for pulmonary rehab- will ensure pt aware  Diabetes.  Glucose at time of admission is 288.  Pt is NPO currently and is on steroids- will monitor glucose closely.  Was on insulin drip in ICU.  Glucose overnight well controlled  (140s-170s) -Decreasing home insulin (Levemir 60U qhs) while NPO- will give Levemir 40U qhs -SSI with CBG monitoring [id number] CHF.  Pt has multiple cardiac/vascular issues, including mitral stenosis (s/p mitral valve replacement 2008), Hx of Afib (s/p Maze procedure in 2012), pulmonary hypertension.  Last echo shows EF 55-65% (2012) with grade 2 diastolic dysfunction.  BNP at admission 1219. -Asprin 8129mHolding lasix today given increased Cr (likely pre-renal component, BUN 36).  Home lasix (63m39md) can be continued tomorrow -Strict I/O -Holding  IVF for now given adequete PO intake  Headache- pt noted to have HA on 3/26 -Will provide Tylenol prn pain for now.  Holding narcotics for now given somnolence and concern for respiratory depression.  Depression/Anxiety. Discussed with PCP (Dr. Guy Sandifer)- likely multifactorial due to comorbidity burden and poor social support. -Continue home meds: -Xanex 27m prn- cutting dose in half given somnolence on AM rounds. -Duloxetine 962mqam -buproprion 14035mhs -Explore social support options with pt (PACE program)  HTN.  Overnight BP relatively well-controlled (110-150s/50s-80s) -Holding home lisinopril for now (60m27may) given slight increase in Cr overnight -Holding lasix today as noted above -Potassium replacement- continue home regimen 40me93mm and 20meq75m  HLD -Continue home meds- Atorvastain 60mg q25mibromyalgia -Continue home meds- Gabapentin 300mg bi45main control- pt takes hydrocodone 5-325 (prn, but takes 2 pills q6hrs at home)  OSA- pt uses CPAP at home, pt knows settings and will use these during hospitalization.  Appreciate RT assistance  GERD  -Takes omeprazole 40mg bid30mll give Protonix 40mg qd w66m NPO  Smoking- Pt quit smoking in 2012 but has since re-started smoking in 2013.  It is possible that depression/life stressors play a role in this.  This is particularly concerning in the setting of home  oxygen use. -Discuss smoking cessation options with pt -Pt given patch on 3/26  DVT prophylaxis:  Heparin    LOS: 1 day   This is a Medical [facility] information officere care of the patient was discussed with Dr. Sharda andBurnard Buntingssessment and plan formulated with their assistance.  Please see their attached note for official documentation of the daily encounter.  PearlsteinSherlon Horn, 10:38 AM

## 2012-05-05 NOTE — Progress Notes (Signed)
Inpatient Diabetes Program Recommendations  AACE/ADA: New Consensus Statement on Inpatient Glycemic Control (2013)  Target Ranges:  Prepandial:   less than 140 mg/dL      Peak postprandial:   less than 180 mg/dL (1-2 hours)      Critically ill patients:  140 - 180 mg/dL   Hyperglycemia following completion of IV insulin drip.  Please note home regimen of Levemir 60 units at HS     Novolog 13 units meal coverage plus correction tidwc.  While on high dose steroid therapy: Please increase levemir to home dose of 60 units/day Add meal coverage of at least 6 units to start. (Given only if pt eats at least 50% of meal. Continue resistant correction tidwc and add HS correction.  Thank you, Rosita Kea, RN, CNS, Diabetes Coordinator [phone])

## 2012-05-05 NOTE — Progress Notes (Signed)
Internal Medicine Teaching Service Attending Note Date: 05/05/2012  Patient name: [name]  Medical record number: [id number]  Date of birth: 1950/04/17    This patient has been seen and discussed with the house staff. Please see their note for complete details. I concur with their findings as noted in Dr. Raenette Rover and MSIV Pearlstein's notes.   Ms. Leaks is improved today.  If she continues to do well, can transition to PO tomorrow and attempt discharge home in the next 24-48 hours.   MULLEN, EMILY 05/05/2012, 11:05 AM

## 2012-05-05 NOTE — Progress Notes (Signed)
Pt complaining of severe headache and stating "These Dr.'s are going to have to give me more". Informed pt of Ibprofen order and how pt refused. Also explained to pt the options given by MD of why not to give vicodin. Pt verbalizes understanding. Pt expresses desire to eat and for Nicotine patch.  Dr. Eulas Post called, discussed pt's desires. Discussed pts hx and course of care. Orders received. Will continue to monitor closely.

## 2012-05-05 NOTE — Progress Notes (Signed)
Pt set up on CPAP QHS per home settings (12.0 cm H2O) w/ 3 lpm O2 bleed in. Pt tolerating and resting.  PT to monitor as needed.

## 2012-05-05 NOTE — Progress Notes (Signed)
PULMONARY  / CRITICAL CARE MEDICINE  Name: [name] MRN: [id number] DOB: [birth year]    ADMISSION DATE:  05/04/2012 CONSULTATION DATE:  05/04/12  REFERRING MD :  Graciella Freer  CHIEF COMPLAINT:  VDRF  BRIEF PATIENT DESCRIPTION: 62 year old female with history of COPD and CHF who recently started smoking again presenting with respiratory failure requiring BiPAP.  ABG stabilized but overnight increase WOB.  PCCM consulted due to respiratory failure.  Also history of pulmonary HTN on echo in 2012.  SIGNIFICANT EVENTS / STUDIES:  3/25 VDRF requiring NIMV.  LINES / TUBES: PIV  CULTURES: Blood 3/25>>> Urine 3/25>>> Sputum 3/25>>>  ANTIBIOTICS: Levofloxacin 3/25>>>  PAST MEDICAL HISTORY :  Past Medical History  Diagnosis Date  . Depression   . GERD (gastroesophageal reflux disease)     Followed by [name] // small bowel capsule endoscopy (11/2009) - minimal chronic inflammation, otherwise negative for  H.pylori, metaplasia, dysplasia  . Hypertension   . Hyperlipidemia   . Diabetes mellitus type II, uncontrolled     on insulin therapy  . COPD (chronic obstructive pulmonary disease) 2008    started home O2 (12/2010) // (+) significant tobacco abuse  . Moderate to severe pulmonary hypertension     severe per TEE (08/2010) - Peak RV-RA gradient 43m Hg  . Mitral regurgitation     mild to moderate per TEE (08/2010)  . Shingles   . Iron deficiency anemia     Prior BL Hgb 9-11. Source initially unclear with EGD/Colonoscopy (05/2011) not indicative of active bleeding source // DX with RCC in 07/2011, s/p cryoablation - with subsequent normalization of Hgb. (2014)  . History of colonic polyps     Colonoscopy (10/2005) - colon polyp with path nonadenomatous, no malignancy - by Dr. [name] // Colonoscopy (02/2002) - 2 polyps noted, 1 - adenomatous, 2 - hyperplastic // Followed by Dr. [name]// Repeat Colonoscopy (05/2011) - single tubular adenoma polyp - performed by Dr. [name] . Anxiety    . IBS (irritable bowel syndrome)   . Osteoporosis     DEXA (12/2004) - L-spine T score -2.6, Left hip -0.1  . Fibromyalgia   . Obstructive sleep apnea     on CPAP // Sleep study (06/2009) - Moderate sleep apnea/ hypopnea syndrome , AHI 17.8 per hour with nonpositional hypopneas. CPAP titration to 12 CWP, AHI 2.4 per hour. Small resMed Quattro full-face mask with heated humidifier  . Restless leg syndrome   . Mitral stenosis     moderate per TEE (08/2010) // Mitral valve replacement with a 27-mm pericardial porcine valve (Medtronic Mosaic valve, serial ##[id number] on 09/20/2010 - PIvin Poot// [name] . Bilateral carotid artery stenosis     s/p right endarterectomy (06/2010). // Carotid UKorea(07/2010) -  60%-79% ICA stenosis, mid range of scale by velocity.  Left: Moderate-to-severe calcific and non-calcific plaque origin andproximal ICA and ECA.  60%-79% ICA stenosis, highest end of scale // Followed by [name]  . Peripheral vascular disease   . Internal hemorrhoids     small internal hemorrhoids noted on colonoscopy 05/2011  . Clear cell renal cell carcinoma 07/21/2011    S/P cryoablation of left RCC in 09/2011 by Dr. YKathlene Cote// Historically - MRI Abdomen (07/2011) - 2.7 x 2.7 x 3.0 cm enhancing solid renal mass posterior aspect of the lower pole of the left kidney. // CT Abdomen in 09/2010 showed no evidence of mass // Followed by Dr. DDiona Fanti (  [facility])   . Diastolic CHF     last echo (12/2010) showing grade 2 diastolic dysfunction. Otherwise, normal EF of 55-65%  . H/O mitral valve replacement 09/30/2011  . A-fib 10/22/2010    S/P Left atrial maze procedure for paroxysmal atrial fibrillation. 09/20/2010 Dr Prescott Gum    Past Surgical History  Procedure Laterality Date  . Orif clavicle fracture  01/2004    by Thana Farr. [name], M.D for Right clavicle nonunion.  . Tonsillectomy    . Carotid endarterectomy  07/04/2010    right, by Dr.  Trula Slade for asymptomatic right carotid artery stenosis  . Lipoma exision  08/2005    occipital lipoma 1.5cm - by Dr. [name]  . Hysteroscopy with endometrial ablation  06/2001    for persistent post-menopausal bleeding // by S. [name], M.D.  . Mitral valve replacement  09/20/10     with a 27-mm pericardial porcine valve (Medtronic Mosaic valve, serial #[id number]). 09/20/10, Dr Prescott Gum  . Left atrial maze procedure  09/20/10    for paroxysmal atrial fibrillation (Dr. Prescott Gum)  . Placement of left chest tube.  09/24/2010    Dr Prescott Gum  . Pr vein bypass graft,aorto-fem-pop  May 2012  . Cardiac valve replacement  Aug. 2012  . Colonoscopy  05/12/2011    performed by Dr. Michail Sermon. Showing small internal hemorrhoids, single tubular adenoma polyp  . Esophagogastroduodenoscopy  05/12/2011    performed by Dr. Michail Sermon. Negative for ulcerations, biopsy negative for evidence of celiac sprue  . Tubal ligation     Prior to Admission medications   Medication Sig Start Date End Date Taking? Authorizing Provider  ACCU-CHEK FASTCLIX LANCETS MISC 1 each by Does not apply route 3 (three) times daily before meals. Check blood sugars 3 times daily before breakfast, lunch, and dinner DX code: 250.02 10/23/11   Maitri S Kalia-Reynolds, DO  albuterol (PROVENTIL HFA;VENTOLIN HFA) 108 (90 BASE) MCG/ACT inhaler Inhale 2 puffs into the lungs every 6 (six) hours as needed. For wheeze or shortness of breath 12/18/11   Maitri S Kalia-Reynolds, DO  alendronate (FOSAMAX) 70 MG tablet Take 70 mg by mouth every 7 (seven) days. Takes on [place] Provider, MD  ALPRAZolam [name]) 1 MG tablet Take 1 tablet (1 mg total) by mouth 3 (three) times daily as needed for sleep or anxiety. 04/30/12   Maitri S Kalia-Reynolds, DO  aspirin EC 81 MG tablet Take 81 mg by mouth every evening.    Historical Provider, MD  azithromycin (ZITHROMAX) 250 MG tablet Take 1 tablet (250 mg total) by mouth daily. Take first 2 tablets  together, then 1 every day until finished. 05/03/12   Heather Lucianne Lei Wingen, PA-C  buPROPion (WELLBUTRIN XL) 150 MG 24 hr tablet Take 1 tablet (150 mg total) by mouth daily with supper. 12/18/11   Maitri S Kalia-Reynolds, DO  calcium carbonate (OS-CAL) 600 MG TABS Take 1,200 mg by mouth daily.    Historical Provider, MD  docusate sodium (COLACE) 50 MG capsule Take 100 mg by mouth every morning. Can Alternate with  senokot    Historical Provider, MD  DULoxetine (CYMBALTA) 30 MG capsule Take 90 mg by mouth daily with breakfast.  07/30/11   Annamarie Dawley, DO  Elastic Bandages & Supports (TRUFORM STOCKINGS 20-30MMHG) MISC Wear compression stockings throughout the day, and take off at nighttime. 05/29/11   Maitri S Kalia-Reynolds, DO  ferrous gluconate (FERGON) 324 MG tablet Take 1 tablet (324 mg total) by mouth  2 (two) times daily with a meal. 05/13/11 05/12/12  Hans Eden, MD  Fluticasone-Salmeterol (ADVAIR DISKUS) 250-50 MCG/DOSE AEPB Inhale 1 puff into the lungs every 12 (twelve) hours. 12/18/11   Maitri S Kalia-Reynolds, DO  furosemide (LASIX) 80 MG tablet Take 80 mg by mouth 2 (two) times daily.    Historical Provider, MD  gabapentin (NEURONTIN) 300 MG capsule Take 300 mg by mouth 2 (two) times daily.    Historical Provider, MD  glucose blood (BAYER CONTOUR TEST) test strip Use to test blood sugar 3 times daily.Dx code 250.00 09/15/11   Bartholomew Crews, MD  HYDROcodone-acetaminophen (NORCO/VICODIN) 5-325 MG per tablet Take 1-2 tablets by mouth every 6 (six) hours as needed for pain.    Historical Provider, MD  insulin aspart (NOVOLOG FLEXPEN) 100 UNIT/ML injection Inject 13-21 Units into the skin See admin instructions. Takes 13 units with meals and also uses SSI with meals in addition to the 13 units of insulin    Historical Provider, MD  insulin detemir (LEVEMIR) 100 UNIT/ML injection Inject 60 Units into the skin at bedtime. 03/30/12   Maitri S Kalia-Reynolds, DO  Insulin Pen Needle (B-D UF III  MINI PEN NEEDLES) 31G X 5 MM MISC DX: 250.02 Please check blood sugars 3 times daily. 01/13/12   Maitri S Kalia-Reynolds, DO  Insulin Syringe-Needle U-100 (INSULIN SYRINGE .3CC/31GX5/16") 31G X 5/16" 0.3 ML MISC 1 Syringe by Does not apply route 2 (two) times daily at 10 AM and 5 PM. 08/20/11   Milta Deiters, MD  lisinopril (PRINIVIL,ZESTRIL) 10 MG tablet Take 1 tablet (10 mg total) by mouth daily. 04/30/12 04/30/13  Maitri S Kalia-Reynolds, DO  loratadine (CLARITIN) 10 MG tablet Take 10 mg by mouth daily with supper. 03/14/11   Maitri S Kalia-Reynolds, DO  nystatin (MYCOSTATIN/NYSTOP) 100000 UNIT/GM POWD Apply 1 g topically daily as needed (; apply to stomach and under vreasts).    Historical Provider, MD  omeprazole (PRILOSEC) 40 MG capsule Take 1 capsule (40 mg total) by mouth 2 (two) times daily. 03/02/12   Maitri S Kalia-Reynolds, DO  potassium chloride SA (K-DUR,KLOR-CON) 20 MEQ tablet Take 20-40 mEq by mouth 2 (two) times daily. 2 tablets in the  Morning and 1 tablet at night    Historical Provider, MD  rosuvastatin (CRESTOR) 20 MG tablet Take 1 tablet (20 mg total) by mouth daily with supper. 12/11/11   Maitri S Kalia-Reynolds, DO  sennosides-docusate sodium (SENOKOT-S) 8.6-50 MG tablet Take 2 tablets by mouth every other day as needed for constipation. Hold for loose stools 05/29/11 05/28/12  Maitri S Kalia-Reynolds, DO  Spacer/Aero-Holding Chambers (AEROCHAMBER MV) inhaler Use as instructed with inhalers. 04/26/12   Maitri S Kalia-Reynolds, DO  tiotropium (SPIRIVA) 18 MCG inhalation capsule Place 1 capsule (18 mcg total) into inhaler and inhale daily with breakfast. 09/23/11   Karren Cobble, MD   Allergies  Allergen Reactions  . Lorazepam     Patient's sister noted that ativan caused the patient to become extremely confused during hospitalization 09/2010  . Morphine     REACTION: Unknown reaction  . Oxycontin (Oxycodone) Other (See Comments)    headache  . Tramadol Hcl     REACTION: swelling     FAMILY HISTORY:  Family History  Problem Relation Age of Onset  . Heart disease Father 24    died to MI at 60yo  . Heart attack Father   . Heart disease Brother 49    died of MI at 85yo  . Heart  attack Brother    SOCIAL HISTORY:  reports that she has been smoking Cigarettes.  She has a 50 pack-year smoking history. She has never used smokeless tobacco. She reports that she does not drink alcohol or use illicit drugs.  REVIEW OF SYSTEMS:  Patient unable to speak clearly with BiPAP.  SUBJECTIVE: Reports having difficulty breathing but not ready for intubation.  VITAL SIGNS: Temp:  [97.3 F (36.3 C)-98.1 F (36.7 C)] 97.5 F (36.4 C) (03/26 1334) Pulse Rate:  [74-101] 88 (03/26 1200) Resp:  [17-28] 17 (03/26 1200) BP: (93-147)/(46-95) 113/54 mmHg (03/26 1200) SpO2:  [88 %-98 %] 93 % (03/26 1200) FiO2 (%):  [50 %] 50 % (03/26 0801) HEMODYNAMICS:   VENTILATOR SETTINGS: Vent Mode:  [-]  FiO2 (%):  [50 %] 50 % INTAKE / OUTPUT: Intake/Output     03/25 0701 - 03/26 0700 03/26 0701 - 03/27 0700   P.O. 290    I.V. (mL/kg) 25.4 (0.3)    IV Piggyback 150    Total Intake(mL/kg) 465.4 (5.6)    Urine (mL/kg/hr) 3050 (1.5)    Total Output 3050     Net -2584.6          Urine Occurrence 2 x     PHYSICAL EXAMINATION: General: Chronically ill appearing obese female.  On CPAP, stable. Neuro:  Awake and interactive, much more alert. HEENT:  [place]/AT, PERRL, EOM-I, unable to assess JVD. Cardiovascular:  RRR, Nl S1/S2, -M/R/G. Lungs:  Decreased BS diffusely. Abdomen:  Soft, NT, ND, obese and +BS. Musculoskeletal:  1+ edema and -tenderness. Skin:  Thin but intact.  LABS:  Recent Labs Lab 05/03/12 1104 05/04/12 0021 05/04/12 0035 05/04/12 0511 05/04/12 0552 05/05/12 0420  HGB 13.7 13.2 13.9 13.0  --  12.7  WBC 7.1 7.7  --  8.4  --  7.0  PLT 175 175  --  166  --  201  NA 139  --  139 137  --  137  K 4.9  --  3.7 4.1  --  4.4  CL 99  --  100 98  --  97  CO2 31  --   --   27  --  30  GLUCOSE 216*  --  288* 287*  --  171*  BUN 26*  --  32* 29*  --  36*  CREATININE 1.16*  --  1.20* 1.12*  --  1.28*  CALCIUM 10.2  --   --  9.1  --  9.1  MG  --   --   --   --   --  2.2  PHOS  --   --   --   --   --  3.0  AST 16  --   --  18  --   --   ALT 17  --   --  17  --   --   ALKPHOS 63  --   --  58  --   --   BILITOT 0.3  --   --  0.2*  --   --   PROT 7.3  --   --  7.1  --   --   ALBUMIN 3.8  --   --  3.7  --   --   TROPONINI  --   --   --  <0.30  --   --   PROBNP  --   --   --  1219.0*  --   --   PHART  --   --   --   --  7.439  --   PCO2ART  --   --   --   --  40.7  --   PO2ART  --   --   --   --  148.0*  --     Recent Labs Lab 05/05/12 0414 05/05/12 0537 05/05/12 0655 05/05/12 0755 05/05/12 1242  GLUCAP 171* 158* 141* 145* 372*   CXR: COPD and pulmonary edema.  ASSESSMENT / PLAN:  PULMONARY A: VDRF due to COPD, pulmonary edema and evidence of bronchitis. P:   - D/C BiPAP and change to QHS CPAP and CPAP when asleep. - Steroids as ordered. - Levofloxacin as ordered. - See ID section. - Bronchodilators. - Diureses as below.  CARDIOVASCULAR A: Grade two diastolic dysfunction with pulmonary HTN. P:  - Diurese. - 2D echo as ordered. - KVO IVF. - Monitor rhythem.  RENAL A:  Stable. P:   - Diureses. - Monitor lytes.  GASTROINTESTINAL A:  No active issues. P:   - Begin diet.  HEMATOLOGIC A:  No active issues. P:  - DVT prophylaxis via SQ heparin.  INFECTIOUS A:  Bronchitis. P:   - Pan culture. - Levofloxacin.  ENDOCRINE A:  DM.   P:   - ISS. - Hold lantus.  NEUROLOGIC A:  Lethargic due to respiratory failure. P:   - Monitor.  Patient stable, home CPAP when asleep, out of the range for intubation at this point.  PCCM will sign off, please call back if needed.  Rush Farmer, M.D. [facility] Pulmonary/Critical Care Medicine. Pager: [phone]. After hours pager: [phone].

## 2012-05-05 NOTE — Progress Notes (Signed)
Visit to patient while in hospital. Will call after she is discharged to assist with transition of care.

## 2012-05-06 ENCOUNTER — Encounter (HOSPITAL_COMMUNITY): Admission: RE | Admit: 2012-05-06 | Payer: Medicare Other | Source: Ambulatory Visit

## 2012-05-06 LAB — GLUCOSE, CAPILLARY
Glucose-Capillary: 264 mg/dL — ABNORMAL HIGH (ref 70–99)
Glucose-Capillary: 429 mg/dL — ABNORMAL HIGH (ref 70–99)
Glucose-Capillary: 207 mg/dL — ABNORMAL HIGH (ref 70–99)
Glucose-Capillary: 170 mg/dL — ABNORMAL HIGH (ref 70–99)
Glucose-Capillary: 120 mg/dL — ABNORMAL HIGH (ref 70–99)

## 2012-05-06 LAB — CULTURE, RESPIRATORY W GRAM STAIN
Culture: NORMAL
Gram Stain: NONE SEEN

## 2012-05-06 LAB — BASIC METABOLIC PANEL
BUN: 33 mg/dL — ABNORMAL HIGH (ref 6–23)
CO2: 30 mEq/L (ref 19–32)
Calcium: 9.1 mg/dL (ref 8.4–10.5)
Chloride: 96 mEq/L (ref 96–112)
Creatinine, Ser: 1.12 mg/dL — ABNORMAL HIGH (ref 0.50–1.10)
GFR calc Af Amer: 59 mL/min — ABNORMAL LOW (ref >90)
GFR calc non Af Amer: 51 mL/min — ABNORMAL LOW (ref >90)
Glucose, Bld: 347 mg/dL — ABNORMAL HIGH (ref 70–99)
Potassium: 4.7 mEq/L (ref 3.5–5.1)
Sodium: 134 mEq/L — ABNORMAL LOW (ref 135–145)

## 2012-05-06 MED ORDER — LEVOFLOXACIN 750 MG PO TABS
750.0000 mg | ORAL_TABLET | Freq: Every day | ORAL | Status: DC
  Administered 2012-05-06 – 2012-05-07 (×2): 750 mg via ORAL
  Filled 2012-05-06 (×2): qty 1

## 2012-05-06 MED ORDER — HYDROCODONE-ACETAMINOPHEN 5-325 MG PO TABS
1.0000 | ORAL_TABLET | Freq: Four times a day (QID) | ORAL | Status: DC | PRN
  Administered 2012-05-06: 2 via ORAL
  Administered 2012-05-06: 1 via ORAL
  Administered 2012-05-07: 2 via ORAL
  Filled 2012-05-06: qty 2
  Filled 2012-05-06: qty 1
  Filled 2012-05-06: qty 2

## 2012-05-06 MED ORDER — PREDNISONE 20 MG PO TABS
40.0000 mg | ORAL_TABLET | Freq: Every day | ORAL | Status: DC
  Administered 2012-05-07: 40 mg via ORAL
  Filled 2012-05-06 (×2): qty 2

## 2012-05-06 MED ORDER — ALPRAZOLAM 0.5 MG PO TABS: 0.5000 mg | ORAL_TABLET | Freq: Two times a day (BID) | ORAL | Status: DC | PRN

## 2012-05-06 MED ORDER — SENNOSIDES-DOCUSATE SODIUM 8.6-50 MG PO TABS
1.0000 | ORAL_TABLET | Freq: Two times a day (BID) | ORAL | Status: DC
  Administered 2012-05-06 – 2012-05-07 (×2): 1 via ORAL
  Filled 2012-05-06 (×2): qty 1

## 2012-05-06 MED ORDER — INSULIN ASPART 100 UNIT/ML ~~LOC~~ SOLN
8.0000 [IU] | Freq: Three times a day (TID) | SUBCUTANEOUS | Status: DC
  Administered 2012-05-06 – 2012-05-07 (×3): 8 [IU] via SUBCUTANEOUS

## 2012-05-06 MED ORDER — MORPHINE SULFATE 2 MG/ML IJ SOLN
2.0000 mg | Freq: Once | INTRAMUSCULAR | Status: AC
  Administered 2012-05-06: 2 mg via INTRAVENOUS
  Filled 2012-05-06: qty 1

## 2012-05-06 MED ORDER — FUROSEMIDE 80 MG PO TABS
80.0000 mg | ORAL_TABLET | Freq: Two times a day (BID) | ORAL | Status: DC
  Administered 2012-05-07: 80 mg via ORAL
  Filled 2012-05-06 (×3): qty 1

## 2012-05-06 MED ORDER — POLYETHYLENE GLYCOL 3350 17 G PO PACK
17.0000 g | PACK | Freq: Every day | ORAL | Status: DC
  Administered 2012-05-06: 17 g via ORAL
  Filled 2012-05-06 (×2): qty 1

## 2012-05-06 MED ORDER — ALPRAZOLAM 0.5 MG PO TABS
1.0000 mg | ORAL_TABLET | Freq: Every evening | ORAL | Status: DC | PRN
  Administered 2012-05-06: 1 mg via ORAL
  Filled 2012-05-06: qty 2

## 2012-05-06 NOTE — Progress Notes (Signed)
Internal Medicine Teaching Service Attending Note Date: 05/06/2012  Patient name: [name]  Medical record number: [id number]  Date of birth: 1950-12-11   I have reviewed the note by MS IV Pearlstein.    S: [name] had persistent headache overnight.  She received one dose of her home hydrocodone/tylenol with a 1/4 improvement of her headache.  She continues to be tremulous.  She has not had a BM since being in the hospital.   O:  Filed Vitals:   05/06/12 0816 05/06/12 0900 05/06/12 1204 05/06/12 1439  BP:  138/59 139/56   Pulse:  76 73   Temp:   98.2 F (36.8 C)   TempSrc:   Oral   Resp:  21    Weight:      SpO2: 97% 97% 97% 95%   General: Sitting on bedside commode as a chair, somewhat tremulous but alert and oriented.  Complaining of headache Eyes: Anicteric sclerae, EOMI Nose: [place] in place Lungs: Course breath sounds throughout, decreased in the bases, no wheezing Heart: Tachycardic - mild (low 100s), RR, no murmur noted Abdomen: Soft, NT, +BS Ext: No edema, thin limbs Neuro: Grossly normal  Labs:  CBC: None for today  BMP:  Na 134, K 4.7, BUN 33, Cr 1.12, Gluc 247  Micro Sputum Cx pending  No new imaging  Assessment: [name] is a 62yo woman with extensive PMH who presented with acute respiratory failure due to COPD exacerbation  1. COPD exacerbation with acute respiratory failure - Much improved today, saturating well on  - Transfer to floor - Transition to oral steroids, abx, and all other medications - Day 3 of steroids - Day 3 of levaquin - Continue duonebs scheduled and xopenex prn  2. Diastolic CHF - Repeat TTE done, technically difficult to interpret - Holding diuresis today as patient with bump in Cr yesterday and BP just improving today - Will restart home diuresis tomorrow (Friday 3/28)  3. Chronic pain, FM - restart home pain medications today as patient doing well from a respiratory standpoint  Remove foley today  Other  issues/plan can be found in MS IV Pearlstein's note.    MULLEN, EMILY 05/06/2012, 2:45 PM

## 2012-05-06 NOTE — Progress Notes (Signed)
Subjective:  Overnight pt continued to have HA/tremors/sweating and slept poorly .  Received 1 dose Hydrocodone overnight which improved sx slightly.  No SOB, difficulty breathing, CP.  Has not had BM since hospitalized.  Objective:  Vital signs in last 24 hours:  Filed Vitals:    05/05/12 0423  05/05/12 0600  05/05/12 0638  05/05/12 0649   BP:  117/68  117/46     Pulse:  83  81  80  74   Temp:  97.7 F (36.5 C)      TempSrc:  Oral      Resp:  _0 Weight:       SpO2:  95%  88%  89%  90%     Intake/Output Summary (Last 24 hours) at 05/05/12 0721 Last data filed at 05/05/12 0400   Gross per 24 hour   Intake  350 ml   Output  2250 ml   Net  -1900 ml    Vitals reviewed. Afebrile overnight. Saturating 94% on 2L [place]  General: resting in bed.  Appears diaphoretic, tremulous, and in mild distress.  Alert and oriented x3 HEENT: No scleral icterus, EOM intact Cardiac: RRR, no m/r/g Pulm: Lungs clear to auscultation bilaterally, no use of accessory muscles Abd: soft, nontender, nondistended, BS present  Ext: No LE edema Neuro: cranial nerves II-XII grossly intact   Lab Results:   CBC 3/26     Component Value Date/Time   WBC 7.0 05/05/2012 0420   RBC 4.02 05/05/2012 0420   HGB 12.7 05/05/2012 0420   HCT 38.2 05/05/2012 0420   PLT 201 05/05/2012 0420   MCV 95.0 05/05/2012 0420   MCH 31.6 05/05/2012 0420   MCHC 33.2 05/05/2012 0420   RDW 14.3 05/05/2012 0420   LYMPHSABS 1.0 05/04/2012 0021   MONOABS 1.1* 05/04/2012 0021   EOSABS 0.0 05/04/2012 0021   BASOSABS 0.0 05/04/2012 0021   BMET (3/27)    Component Value Date/Time   NA 134* 05/06/2012 0430   K 4.7 05/06/2012 0430   CL 96 05/06/2012 0430   CO2 30 05/06/2012 0430   GLUCOSE 347* 05/06/2012 0430   BUN 33* 05/06/2012 0430   CREATININE 1.12* 05/06/2012 0430   CREATININE 0.94 11/25/2011 1054   CALCIUM 9.1 05/06/2012 0430   GFRNONAA 51* 05/06/2012 0430   GFRAA 59* 05/06/2012 0430   CBG (last 3)   Recent Labs   05/05/12 1734 05/05/12 2156 05/06/12 0752  GLUCAP 326* 308* 264*    Micro Results:   CULTURE, EXPECTORATED SPUTUM-ASSESSMENT Status: None    Collection Time    05/04/12 1:30 PM   Result  Value  Range  Status    Specimen Description  SPUTUM   Final    Special Requests  NONE   Final    Sputum evaluation    Final    Value:  THIS SPECIMEN IS ACCEPTABLE. RESPIRATORY CULTURE REPORT TO FOLLOW.    Report Status  05/04/2012 FINAL   Final    Studies/Results:  Dg Chest Port 1 View  05/05/2012  IMPRESSION: No interval change. Original Report Authenticated By: Dereck Ligas, M.D.   Dg Chest Port 1 View  05/04/2012 IMPRESSION: Peribronchial thickening and increased interstitial markings likely reflecting bronchitis. Cannot exclude mild interstitial edema. Original Report Authenticated By: Marijo Sanes, M.D.   Dg Chest 2 View  05/03/2012 IMPRESSION: No acute chest findings. Original Report Authenticated By: Markus Daft, M.D.   Medications: I have reviewed the patient's current  medications.  Scheduled Meds:  .  antiseptic oral rinse  15 mL  Mouth Rinse  q12n4p   .  aspirin EC  81 mg  Oral  QPM   .  atorvastatin  10 mg  Oral  q1800   .  buPROPion  150 mg  Oral  Q supper   .  calcium carbonate  1 tablet  Oral  Q breakfast   .  chlorhexidine  15 mL  Mouth Rinse  BID   .  docusate sodium  100 mg  Oral  q morning - 10a   .  DULoxetine  90 mg  Oral  Q breakfast   .  ferrous gluconate  324 mg  Oral  BID WC   .  gabapentin  300 mg  Oral  BID   .  heparin  5,000 Units  Subcutaneous  Q8H   .  ibuprofen  400 mg  Oral  Once   .  insulin detemir  20 Units  Subcutaneous  QHS   .  insulin glargine  10 Units  Subcutaneous  NOW   .  ipratropium  0.5 mg  Nebulization  Q4H   .  levalbuterol  0.63 mg  Nebulization  Q4H   .  levofloxacin (LEVAQUIN) IV  750 mg  Intravenous  Q24H   .  loratadine  10 mg  Oral  Q supper   .  methylPREDNISolone (SOLU-MEDROL) injection  60 mg  Intravenous  Q6H   .   mometasone-formoterol  2 puff  Inhalation  BID   .  nicotine  14 mg  Transdermal  Daily   .  pantoprazole  40 mg  Oral  Daily   .  potassium chloride SA  20 mEq  Oral  QHS   .  potassium chloride  40 mEq  Oral  Daily   .  sodium chloride  3 mL  Intravenous  Q12H   .  sodium chloride  3 mL  Intravenous  Q12H    Continuous Infusions:  .  insulin (NOVOLIN-R) infusion  4.9 Units/hr (05/05/12 0656)    PRN Meds:.sodium chloride, acetaminophen, acetaminophen, albuterol, ALPRAZolam, alum & mag hydroxide-simeth, nystatin, ondansetron (ZOFRAN) IV, ondansetron, sodium chloride    Assessment/Plan:  62 yo F with history of COPD on home O2, HTN, HLD, DM, pulm HTN, OSA, Afib s/p left atrial maze procedure (no anticoag given fall risk) presented on 05/04/12 with acute respiratory failure.   #Acute Resp failure secondary to COPD exacerbation: Improved this morning, no SOB or increased work of breathing. saturating 94% on 2L [place]  -Transfer to floor, consider discharge later today pending successful transition to PO meds and continued improvement in respiratory function -Goal O2 sat 90-92% (given COPD, on 1L at home)  -Switch to PO steroids- Prednisone 74m.  Total 5 days (currently day 3, started 3/25). -Switch to PO Levaquin (currently day 3, started 05/04/12) -Continue duonebs scheduled & xopenex prn & dulera   #Diastolic CHF: Cr decreased today (1.12) after holding lasix yesterday.  Pt -1.9L overnight ; physical exam and labs (pBNP 1219) less concerning for CHF exacerbation, and goal is to keep lungs dry  -Repeat echo --> EF 60-65%, and technically difficult study  -Will hold diuresis today (home dose 822mPO bid)- BP low-normal and lungs sound clear -Monitor i/o  -AM labs: bmet   #DM: A1c 8 in 01/2012. Home regimen includes Levemir 60 qHS and SSI; CBGs remain elevated (264-326 overnight).  Will switch to PO steroids today,  so CBG likely to improve -Escalate to Levemir 60u qHS (home dose) with 6U  mealtime novolog per diabetes coordinator.  Continue resistant SSI since diet restarted  #HTN: BP low (97/72 this AM, ranging 120s-130s/50s overnight).  -Lisinopril held given mildly elevated Cr  -Holding lasix today  #Pain medication:  Pt is mildly diaphoretic and tremulous this AM, likely secondary to mild withdrawal symptoms.  She takes 2 Hydrocodone (5-325) tid at home.  Was holding throughout hospitalization given concern for respiratory depression, but breathing comfortably this AM -Will continue home regimen 1-2 tablets Hydrocodone (5-325) q6hrs prn  #Fibromyalgia: likely contributing to HA; home vicodin held given lethargy  -Continue Gabapentin 349m bid -pain meds as above  #Anxiety/Depression: Will plan to decrease xanex to 0.5108mTID prn since 6m9mID seems to be contributing to acute resp failure and makes assessment difficult.  Continue Duloxetine, Buproprion (home meds)  #OSA- Pt on CPAP at home- continue with home settings.  Appreciate RT assistance  #HLD- continue home meds (Atorvastatin 9m48m)  #Tobacco abuse: Re-started smoking in 2013 since quitting in 2012.  Abrupt cessation while hospitalized likely contributing to HA  -Nicotine patch started.   #VTE ppx: heparin   Dispo: Disposition is deferred at this time, awaiting improvement of current medical problems. Anticipated discharge in approximately 1-2 day(s).  The patient does have a current PCP (KALIA-REYNOLDS, SHELLY, DO), therefore will be requiring OPC follow-up after discharge.  The patient does not know have transportation limitations that hinder transportation to clinic appointments.

## 2012-05-06 NOTE — Progress Notes (Signed)
Inpatient Diabetes Program Recommendations  AACE/ADA: New Consensus Statement on Inpatient Glycemic Control (2013)  Target Ranges:  Prepandial:   less than 140 mg/dL      Peak postprandial:   less than 180 mg/dL (1-2 hours)      Critically ill patients:  140 - 180 mg/dL   High glucose levels, however noted pt not to get more solumedrol and to start prednisone tomorrow.  Glucose levels should come down a little bit in the next 24 hrs.  However, I recommend an increase in meal coverage at this time to 10 units tidwc. Thank you, Rosita Kea, RN, CNS, Diabetes Coordinator [phone])

## 2012-05-06 NOTE — Progress Notes (Signed)
MD was paged for CBG of 429, and ordered to give 20 units of novolog  plus the 6 units meal coverage .

## 2012-05-06 NOTE — Progress Notes (Signed)
Patient is currently active with [facility] for chronic disease management services.  Patient has been engaged by a [employer] and LCSW.  Our community based plan of care has focused on disease management of Depression, COPD, Diabetes management and smoking cessation.  Patient lives alone and has a sister that comes by weekly to take her out for groceries and errands.  Patient continues to smoke and has not chosen to implement any of our proposed self management interventions.  Our LCSW continues to be engaged with her for the purpose of managing her depression in an effort to improve overall compliance.  She is active in pulmonary rehabilitation.  She is appropriate to return to home, however she should be strongly encouraged to go to short term rehabilitation if she can not self care.  Patient will receive a post discharge transition of care call and will be evaluated for monthly home visits for assessments and disease process education.  Made inpatient Case Manager [facility] aware that [facility] following. Of note, [facility] services does not replace or interfere with any services that are arranged by inpatient case management or social work.  For additional questions or referrals please contact Corliss Blacker BSN RN [facility] Liaison at [phone].

## 2012-05-06 NOTE — Progress Notes (Signed)
2145: Pt has many complaints and expresses them to this nurse about pain, medications, comfort, irritability with the days events. Pt appears irritable and shaking and upset. Allowed pt to vent and provide education on medications with adverse reactions. Pt acknowledges and continues to request for MD to gather more information about her course of care and medications.  2200: Dr. Eulas Post called and notified 2215: MD's in room. Discussed medications, pt condition and possible solutions. Pt agrees. Orders received. Will continue to monitor closely.

## 2012-05-07 ENCOUNTER — Telehealth: Payer: Self-pay | Admitting: Dietician

## 2012-05-07 LAB — BASIC METABOLIC PANEL
BUN: 29 mg/dL — ABNORMAL HIGH (ref 6–23)
CO2: 30 mEq/L (ref 19–32)
Calcium: 9.1 mg/dL (ref 8.4–10.5)
Chloride: 97 mEq/L (ref 96–112)
Creatinine, Ser: 1.16 mg/dL — ABNORMAL HIGH (ref 0.50–1.10)
GFR calc Af Amer: 57 mL/min — ABNORMAL LOW (ref >90)
GFR calc non Af Amer: 49 mL/min — ABNORMAL LOW (ref >90)
Glucose, Bld: 101 mg/dL — ABNORMAL HIGH (ref 70–99)
Potassium: 3.4 mEq/L — ABNORMAL LOW (ref 3.5–5.1)
Sodium: 136 mEq/L (ref 135–145)

## 2012-05-07 LAB — GLUCOSE, CAPILLARY
Glucose-Capillary: 110 mg/dL — ABNORMAL HIGH (ref 70–99)
Glucose-Capillary: 239 mg/dL — ABNORMAL HIGH (ref 70–99)

## 2012-05-07 MED ORDER — PREDNISONE 20 MG PO TABS: 40.0000 mg | ORAL_TABLET | Freq: Every day | ORAL | Status: DC

## 2012-05-07 MED ORDER — PREDNISONE 20 MG PO TABS: 40.0000 mg | ORAL_TABLET | Freq: Every day | ORAL | Status: AC

## 2012-05-07 MED ORDER — LEVOFLOXACIN 750 MG PO TABS: 750.0000 mg | ORAL_TABLET | Freq: Every day | ORAL | Status: AC

## 2012-05-07 MED ORDER — LEVOFLOXACIN 750 MG PO TABS: 750.0000 mg | ORAL_TABLET | Freq: Every day | ORAL | Status: DC

## 2012-05-07 MED ORDER — FLEET ENEMA 7-19 GM/118ML RE ENEM
1.0000 | ENEMA | Freq: Once | RECTAL | Status: AC
  Administered 2012-05-07: 1 via RECTAL
  Filled 2012-05-07: qty 1

## 2012-05-07 NOTE — Progress Notes (Signed)
Notified Glenn, MD requesting order to d/c foley due to patient being able to get to [facility]. MD stated to leave in foley tonight and she would pass on to day shift MD to address if patient needed foley in the morning. Will continue to monitor patient. Ranelle Oyster, RN

## 2012-05-07 NOTE — Telephone Encounter (Signed)
Discharge date: 05/06/12 Call date: 05/07/12 Hospital follow up appointment date: 05/11/12  Calling to assist with transition of care from hospital to home.  Discharge medications reviewed: no  Able to fill all prescriptions? yes Patient aware of hospital follow up appointments. yes No problems with transportation. Pt denied.   Other problems/concerns: no

## 2012-05-07 NOTE — Progress Notes (Signed)
I have reviewed progress note by Cheral Almas, MSIV. Please see my additional note below for further details:  Subjective: No acute events overnight.  Feels better since home meds are being restarted.  Breathing improved.   At home, uses senokot and colace for BM.  Objective: Vital signs in last 24 hours: Filed Vitals:   05/06/12 2111 05/06/12 2352 05/07/12 0436 05/07/12 0446  BP: 144/64 156/75  123/87  Pulse: 79 89 87 77  Temp: 98 F (36.7 C) 97.8 F (36.6 C)  97.8 F (36.6 C)  TempSrc: Oral Oral  Oral  Resp: _0 Height:      Weight:    178 lb 2.1 oz (80.8 kg)  SpO2: 98% 98% 98% 96%   Weight change: -7 lb 0.9 oz (-3.2 kg)  Intake/Output Summary (Last 24 hours) at 05/07/12 0825 Last data filed at 05/06/12 2257  Gross per 24 hour  Intake    490 ml  Output   1800 ml  Net  -1310 ml   General: resting in bed HEENT: PERRL, EOMI, no scleral icterus Cardiac: RRR, no rubs, murmurs or gallops Pulm: clear to auscultation bilaterally, moving normal volumes of air, currently getting neb tx Abd: soft, nontender, nondistended, BS present Ext: warm and well perfused, no pedal edema Neuro: alert and oriented X3, cranial nerves II-XII grossly intact  Lab Results: Basic Metabolic Panel:  Recent Labs Lab 05/05/12 0420 05/06/12 0430  NA 137 134*  K 4.4 4.7  CL 97 96  CO2 30 30  GLUCOSE 171* 347*  BUN 36* 33*  CREATININE 1.28* 1.12*  CALCIUM 9.1 9.1  MG 2.2  --   PHOS 3.0  --    Liver Function Tests:  Recent Labs Lab 05/03/12 1104 05/04/12 0511  AST 16 18  ALT 17 17  ALKPHOS 63 58  BILITOT 0.3 0.2*  PROT 7.3 7.1  ALBUMIN 3.8 3.7   CBC:  Recent Labs Lab 05/03/12 1104 05/04/12 0021  05/04/12 0511 05/05/12 0420  WBC 7.1 7.7  --  8.4 7.0  NEUTROABS 4.8 5.6  --   --   --   HGB 13.7 13.2  < > 13.0 12.7  HCT 39.9 38.6  < > 37.9 38.2  MCV 92.8 91.9  --  92.0 95.0  PLT 175 175  --  166 201  < > = values in this interval not displayed. Cardiac  Enzymes:  Recent Labs Lab 05/04/12 0511  TROPONINI <0.30   BNP:  Recent Labs Lab 05/04/12 0511  PROBNP 1219.0*   CBG:  Recent Labs Lab 05/06/12 0752 05/06/12 1153 05/06/12 1608 05/06/12 1739 05/06/12 2118 05/07/12 0748  GLUCAP 264* 429* 207* 170* 120* 110*   Hemoglobin A1C:  Recent Labs Lab 05/04/12 0511  HGBA1C 8.4*   Urinalysis:  Recent Labs Lab 05/03/12 1305 05/04/12 1250  COLORURINE YELLOW YELLOW  LABSPEC 1.019 1.016  PHURINE 5.5 5.0  GLUCOSEU NEGATIVE >1000*  HGBUR TRACE* TRACE*  BILIRUBINUR NEGATIVE NEGATIVE  KETONESUR NEGATIVE NEGATIVE  PROTEINUR NEGATIVE NEGATIVE  UROBILINOGEN 0.2 0.2  NITRITE NEGATIVE NEGATIVE  LEUKOCYTESUR NEGATIVE NEGATIVE    Micro Results: Recent Results (from the past 240 hour(s))  MRSA PCR SCREENING     Status: None   Collection Time    05/04/12  6:05 AM      Result Value Range Status   MRSA by PCR NEGATIVE  NEGATIVE Final   Comment:            The GeneXpert MRSA Assay (  FDA     approved for NASAL specimens     only), is one component of a     comprehensive MRSA colonization     surveillance program. It is not     intended to diagnose MRSA     infection nor to guide or     monitor treatment for     MRSA infections.  CULTURE, BLOOD (ROUTINE X 2)     Status: None   Collection Time    05/04/12 12:28 PM      Result Value Range Status   Specimen Description BLOOD RIGHT ARM   Final   Special Requests BOTTLES DRAWN AEROBIC AND ANAEROBIC 10CC   Final   Culture  Setup Time 05/04/2012 16:17   Final   Culture     Final   Value:        BLOOD CULTURE RECEIVED NO GROWTH TO DATE CULTURE WILL BE HELD FOR 5 DAYS BEFORE ISSUING A FINAL NEGATIVE REPORT   Report Status PENDING   Incomplete  CULTURE, BLOOD (ROUTINE X 2)     Status: None   Collection Time    05/04/12 12:32 PM      Result Value Range Status   Specimen Description BLOOD RIGHT HAND   Final   Special Requests BOTTLES DRAWN AEROBIC ONLY 8CC   Final   Culture   Setup Time 05/04/2012 16:17   Final   Culture     Final   Value:        BLOOD CULTURE RECEIVED NO GROWTH TO DATE CULTURE WILL BE HELD FOR 5 DAYS BEFORE ISSUING A FINAL NEGATIVE REPORT   Report Status PENDING   Incomplete  URINE CULTURE     Status: None   Collection Time    05/04/12 12:50 PM      Result Value Range Status   Specimen Description URINE, CATHETERIZED   Final   Special Requests NONE   Final   Culture  Setup Time 05/04/2012 13:31   Final   Colony Count NO GROWTH   Final   Culture NO GROWTH   Final   Report Status 05/05/2012 FINAL   Final  CULTURE, EXPECTORATED SPUTUM-ASSESSMENT     Status: None   Collection Time    05/04/12  1:30 PM      Result Value Range Status   Specimen Description SPUTUM   Final   Special Requests NONE   Final   Sputum evaluation     Final   Value: THIS SPECIMEN IS ACCEPTABLE. RESPIRATORY CULTURE REPORT TO FOLLOW.   Report Status 05/04/2012 FINAL   Final  CULTURE, RESPIRATORY (NON-EXPECTORATED)     Status: None   Collection Time    05/04/12  1:30 PM      Result Value Range Status   Specimen Description SPUTUM   Final   Special Requests NONE   Final   Gram Stain     Final   Value: NO WBC SEEN     FEW SQUAMOUS EPITHELIAL CELLS PRESENT     RARE GRAM POSITIVE COCCI IN PAIRS   Culture NORMAL OROPHARYNGEAL FLORA   Final   Report Status 05/06/2012 FINAL   Final   Medications: I have reviewed the patient's current medications. Scheduled Meds: . antiseptic oral rinse  15 mL Mouth Rinse q12n4p  . aspirin EC  81 mg Oral QPM  . atorvastatin  10 mg Oral q1800  . buPROPion  150 mg Oral Q supper  . calcium carbonate  1 tablet Oral Q  breakfast  . chlorhexidine  15 mL Mouth Rinse BID  . docusate sodium  100 mg Oral q morning - 10a  . DULoxetine  90 mg Oral Q breakfast  . ferrous gluconate  324 mg Oral BID WC  . furosemide  80 mg Oral BID  . gabapentin  300 mg Oral BID  . heparin  5,000 Units Subcutaneous Q8H  . ibuprofen  400 mg Oral Once  . insulin  aspart  0-20 Units Subcutaneous TID WC  . insulin aspart  8 Units Subcutaneous TID WC  . insulin detemir  60 Units Subcutaneous QHS  . ipratropium  0.5 mg Nebulization Q4H  . levalbuterol  0.63 mg Nebulization Q4H  . levofloxacin  750 mg Oral Daily  . loratadine  10 mg Oral Q supper  . mometasone-formoterol  2 puff Inhalation BID  . nicotine  14 mg Transdermal Daily  . pantoprazole  40 mg Oral Daily  . polyethylene glycol  17 g Oral Daily  . predniSONE  40 mg Oral Q breakfast  . senna-docusate  1 tablet Oral BID  . sodium chloride  3 mL Intravenous Q12H  . sodium chloride  3 mL Intravenous Q12H   Continuous Infusions:  PRN Meds:.sodium chloride, acetaminophen, acetaminophen, albuterol, ALPRAZolam, ALPRAZolam, alum & mag hydroxide-simeth, HYDROcodone-acetaminophen, nystatin, ondansetron (ZOFRAN) IV, ondansetron, sodium chloride  Assessment/Plan: 62 yo F with history of COPD on home O2, HTN, HLD, DM, pulm HTN, OSA, Afib s/p left atrial maze procedure (no anticoag given fall risk) presented on 05/04/12 with acute respiratory failure.   #Acute Resp failure secondary to COPD exacerbation: Significantly improved, saturating 96% on 2L [place]  -Goal O2 sat 90-92% (given COPD, on 1L at home)  -continue IV steroids & Levaquin (day 4, started 05/04/12) -Continue duonebs scheduled & xopenex prn & dulera   #Diastolic CHF: Diuresis has been held x 2d; (net -5.6 L since admission); physical exam not concerning for CHF exacerbation, and goal is to keep lungs dry; Repeat echo --> EF 60-65%, and technically difficult study  -Continue home dose lasix at discharge (80 PO BID) -Monitor i/o  -follow am bmet  #DM: A1c 8 in 01/2012. Home regimen includes Levemir 60 qHS and aspart; CBGs improved this morning.  -continue home regimen at d/c (levemir 60, aspart 13 TIDWC)  #HTN: BP improved today  -Lisinopril held given mildly elevated Cr - restart at d/c -Continue lasix at d/c  #Fibromyalgia: symptoms improved  since home meds restarted  #Anxiety/Depression: home regimen restarted  #Tobacco abuse: Abrupt cessation likely contributing to HA  -Nicotine patch started.   #VTE ppx: heparin   -->Remainder of chronic issues per Cheral Almas MSIV's note    Dispo: Anticipated discharge in approximately  today.   The patient does have a current PCP (KALIA-REYNOLDS, SHELLY, DO), therefore will be requiring OPC follow-up after discharge.   The patient does not have transportation limitations that hinder transportation to clinic appointments.  .Services Needed at time of discharge: Y = Yes, Blank = No PT:   OT:   RN:   Equipment:   Other:     LOS: 3 days   SHARDA, NEEMA 05/07/2012, 8:25 AM

## 2012-05-07 NOTE — Discharge Summary (Signed)
I saw Ms. Freshour on day of discharge and formulated the plan with the resident team.

## 2012-05-07 NOTE — Progress Notes (Signed)
Medical Student Daily Progress Note  Subjective: Overnight no acute events.  Home hydrocodone prn restarted yesterday.  Xanex and morphine given last night.  HA, Tremulousness/diaphoresis improved.  Slept well.  Still no BM since hospitalized Objective: Vital signs in last 24 hours: Filed Vitals:   05/06/12 2111 05/06/12 2352 05/07/12 0436 05/07/12 0446  BP: 144/64 156/75  123/87  Pulse: 79 89 87 77  Temp: 98 F (36.7 C) 97.8 F (36.6 C)  97.8 F (36.6 C)  TempSrc: Oral Oral  Oral  Resp: _0 Height:      Weight:    80.8 kg (178 lb 2.1 oz)  SpO2: 98% 98% 98% 96%   Weight change: -3.2 kg (-7 lb 0.9 oz)  Intake/Output Summary (Last 24 hours) at 05/07/12 0757 Last data filed at 05/06/12 2257  Gross per 24 hour  Intake    500 ml  Output   1800 ml  Net  -1300 ml   Physical Exam: Vitals reviewed. Afebrile overnight. Saturating >96% overnight General: resting in bed, alert and oriented x3  HEENT: No scleral icterus, EOM intact  Cardiac: RRR, no m/r/g  Pulm: Lungs clear to auscultation bilaterally, no use of accessory muscles  Abd: soft, nontender, nondistended, BS present  Ext: No LE edema  Neuro: cranial nerves II-XII grossly intact   Lab Results: CBC (Last CBC 3/26)    Component Value Date/Time   WBC 7.0 05/05/2012 0420   RBC 4.02 05/05/2012 0420   HGB 12.7 05/05/2012 0420   HCT 38.2 05/05/2012 0420   PLT 201 05/05/2012 0420   MCV 95.0 05/05/2012 0420   MCH 31.6 05/05/2012 0420   MCHC 33.2 05/05/2012 0420   RDW 14.3 05/05/2012 0420   LYMPHSABS 1.0 05/04/2012 0021   MONOABS 1.1* 05/04/2012 0021   EOSABS 0.0 05/04/2012 0021   BASOSABS 0.0 05/04/2012 0021   BMET (Last BMET 3/27)    Component Value Date/Time   NA 134* 05/06/2012 0430   K 4.7 05/06/2012 0430   CL 96 05/06/2012 0430   CO2 30 05/06/2012 0430   GLUCOSE 347* 05/06/2012 0430   BUN 33* 05/06/2012 0430   CREATININE 1.12* 05/06/2012 0430   CREATININE 0.94 11/25/2011 1054   CALCIUM 9.1 05/06/2012 0430   GFRNONAA  51* 05/06/2012 0430   GFRAA 59* 05/06/2012 0430    Micro Results:  Urine culture:  No growth, Final Blood culture: No growth to date  Studies/Results: None  Medications: I have reviewed the patient's current medications. Scheduled Meds: . antiseptic oral rinse  15 mL Mouth Rinse q12n4p  . aspirin EC  81 mg Oral QPM  . atorvastatin  10 mg Oral q1800  . buPROPion  150 mg Oral Q supper  . calcium carbonate  1 tablet Oral Q breakfast  . chlorhexidine  15 mL Mouth Rinse BID  . docusate sodium  100 mg Oral q morning - 10a  . DULoxetine  90 mg Oral Q breakfast  . ferrous gluconate  324 mg Oral BID WC  . furosemide  80 mg Oral BID  . gabapentin  300 mg Oral BID  . heparin  5,000 Units Subcutaneous Q8H  . ibuprofen  400 mg Oral Once  . insulin aspart  0-20 Units Subcutaneous TID WC  . insulin aspart  8 Units Subcutaneous TID WC  . insulin detemir  60 Units Subcutaneous QHS  . ipratropium  0.5 mg Nebulization Q4H  . levalbuterol  0.63 mg Nebulization Q4H  . levofloxacin  750 mg  Oral Daily  . loratadine  10 mg Oral Q supper  . mometasone-formoterol  2 puff Inhalation BID  . nicotine  14 mg Transdermal Daily  . pantoprazole  40 mg Oral Daily  . polyethylene glycol  17 g Oral Daily  . predniSONE  40 mg Oral Q breakfast  . senna-docusate  1 tablet Oral BID  . sodium chloride  3 mL Intravenous Q12H  . sodium chloride  3 mL Intravenous Q12H   Continuous Infusions:  PRN Meds:.sodium chloride, acetaminophen, acetaminophen, albuterol, ALPRAZolam, ALPRAZolam, alum & mag hydroxide-simeth, HYDROcodone-acetaminophen, nystatin, ondansetron (ZOFRAN) IV, ondansetron, sodium chloride Assessment/Plan:  62 yo F with history of COPD on home O2, HTN, HLD, DM, pulm HTN, OSA, Afib s/p left atrial maze procedure (no anticoag given fall risk) presented on 05/04/12 with acute respiratory failure.   #Acute Resp failure secondary to COPD exacerbation: Respiratory status continues to improve, no SOB or  increased work of breathing. saturating >96% overnight on CPAP and 2L .  Pt succesfully switched to PO meds yesterday (Levaquin, prednisone) -Goal O2 sat 90-92%.  Pt is on 1-2L home O2 -Continue PO steroids- Prednisone 42m. Total 5 days (currently day 4, started 3/25).  -Continue PO Levaquin (currently day 4, started 05/04/12)  -Continue duonebs scheduled & xopenex prn & dulera   #Diastolic CHF: Initial physical exam and labs (pBNP 1219) less concerning for CHF exacerbation.  Lasix held for past 2 days given initial increase in Cr and low-normal BP.  Echo (3/25) shows EF 60-65%, and technically difficult study.  Last Cr was 1.12 (3/26)- pt Cr decreased yesterday (1.12, close to baseline). Pt -1.9L overnight ;  -Will recheck BMET today -Continue Lasix today (home dose 855mbid) -Monitor i/o   #DM: A1c 8 in 01/2012. Home regimen includes Levemir 60 qHS.; CBGs elevated yesterday (400s) but improved overnight (120s-140s).  Switched to PO prednisone (408myesterday, so CBGs likely to continue to improve.  Appreciate diabetic coordinator input- increased mealtime novolog to 8U yesterday (previously 6U) -Levemir 60u qHS (home dose) with 8U mealtime novolog per diabetes coordinator. Continue resistant SSI since diet restarted  -Will discharge on home insulin (Levemir 60U qhs) with instructions to monitor glucose as effects of steroids wear off -Changed to modified carb diet yesterday  #HTN: BPs improved (123/87 this AM, ranging 140s-60s overnight).  -Lisinopril held given mildly elevated Cr- can continue following discharge -Lasix held for past 2 days- can continue following discharge  #Pain medication: Pt previously found to be tremulous/diaphoretic, and pain medications/xanex were originally held. Home pain meds (Hydrocodone 5-325) restarted yesterday.  Xanex 1mg55ms given yesterday, 0.5mg 67m prn  #Fibromyalgia: likely contributing to HA -Continue Gabapentin 300mg 72m -pain meds as above    #Anxiety/Depression: Will plan to decrease xanex to 0.5mg [id number] since 1mg TID63mems to be contributing to acute resp failure and makes assessment difficult. Continue Duloxetine, Buproprion (home meds)   #OSA- Pt on CPAP at home- continue with home settings. Appreciate RT assistance   #HLD- continue home meds (Atorvastatin 10mg qd)71mTobacco abuse: Re-started smoking in 2013 since quitting in 2012. Abrupt cessation while hospitalized likely contributing to HA  -Nicotine patch started.   #VTE ppx: heparin    Principal Problem:   Acute respiratory failure Active Problems:   HYPERTENSION   Type II or unspecified type diabetes mellitus with unspecified complication, uncontrolled   COPD (chronic obstructive pulmonary disease)   Mitral stenosis   LOS: 3 days   This is a Medical  Student Note.  The care of the patient was discussed with Dr. Burnard Bunting and the assessment and plan formulated with their assistance.  Please see their attached note for official documentation of the daily encounter.  Sherlon Handing 05/07/2012, 7:57 AM

## 2012-05-07 NOTE — Progress Notes (Signed)
Foley was removed this am and patient has voided 350+cc of urine.  No changes noted since am assessment.  IV dc'ed earlier this shift.  DC instructions carefully reviewed with patient.  F/U appt scheduled for 4/1.  Other appts reviewed.  Home med rec carefully reviewed.  Patient denied questions or concerns.  Declined for [place] to take her down with O2 and stated that her ride was bringing her O2 tank.  CM set up [facility] PT.  Patient DC to home.

## 2012-05-07 NOTE — Progress Notes (Signed)
Patient had small dark brown hard stool after fleets enema. Patient states she feels better, "cramping is gone". Will continue to monitor patient. Ranelle Oyster, RN

## 2012-05-07 NOTE — Discharge Summary (Signed)
Patient Name: [name] MRN: [id number]  DOB: [birth year]   PCP: [name], DO   Date of Admission: 05/04/2012  Date of Discharge: 05/06/2012   Attending Physician: Sid Falcon, MD   DISCHARGE DIAGNOSES:  1. COPD 2. TII DM 3. Diastolic CHF 4. HTN 5. Mitral Stenosis s/p valve replacement 6. Dyslipidemia 7. Depression 8. GERD 9. Pulmonary hypertension 10. Anemia 11. Osteoporosis 12. OSA 13. Fibromyalgia   DISPOSITION AND FOLLOW-UP:  [name] is to follow-up with the listed providers as detailed below, at which time, the following should be addressed:  1. Dr. Guy Sandifer, 4/1 at [facility] following diet appointment at 9:30AM 2. Labs / imaging needed at time of follow-up: -BMET - monitor renal function -CBG given recent steroid use (5 day course, anticipated completion 3/29) 3. Pending labs/ test needing follow-up: None  DISCHARGE INSTRUCTIONS:    Future Appointments  Provider  Department  Dept Phone    05/11/2012 9:30 AM  [place], [place]  [phone]    05/11/2012 10:00 AM  Annamarie Dawley, DO  [facility]  [phone]    05/11/2012 1:30 PM  [facility]  [phone]   DISCHARGE MEDICATIONS:    Medication List     STOP taking these medications       azithromycin 250 MG tablet    Commonly known as: ZITHROMAX     TAKE these medications       ACCU-CHEK FASTCLIX LANCETS Misc    1 each by Does not apply route 3 (three) times daily before meals. Check blood sugars 3 times daily before breakfast, lunch, and dinner  DX code: 250.02    AEROCHAMBER MV inhaler    Use as instructed with inhalers.    albuterol 108 (90 BASE) MCG/ACT inhaler    Commonly known as: PROVENTIL HFA;VENTOLIN HFA    Inhale 2 puffs into the lungs every 6 (six) hours as needed. For wheeze or shortness of breath    alendronate 70 MG tablet    Commonly  known as: FOSAMAX    Take 70 mg by mouth every 7 (seven) days. Takes on wednesdays    ALPRAZolam 1 MG tablet    Commonly known as: XANAX    Take 1 tablet (1 mg total) by mouth 3 (three) times daily as needed for sleep or anxiety.    aspirin EC 81 MG tablet    Take 81 mg by mouth every evening.    buPROPion 150 MG 24 hr tablet    Commonly known as: WELLBUTRIN XL    Take 1 tablet (150 mg total) by mouth daily with supper.    calcium carbonate 600 MG Tabs    Commonly known as: OS-CAL    Take 1,200 mg by mouth daily.    docusate sodium 50 MG capsule    Commonly known as: COLACE    Take 100 mg by mouth every morning. Can Alternate with senokot    DULoxetine 30 MG capsule    Commonly known as: CYMBALTA    Take 90 mg by mouth daily with breakfast.    ferrous gluconate 324 MG tablet    Commonly known as: FERGON    Take 1 tablet (324 mg total) by mouth 2 (two) times daily with a meal.    Fluticasone-Salmeterol 250-50 MCG/DOSE Aepb    Commonly known as: ADVAIR DISKUS    Inhale 1 puff into the lungs  every 12 (twelve) hours.    furosemide 80 MG tablet    Commonly known as: LASIX    Take 80 mg by mouth 2 (two) times daily.    gabapentin 300 MG capsule    Commonly known as: NEURONTIN    Take 300 mg by mouth 2 (two) times daily.    glucose blood test strip    Commonly known as: BAYER CONTOUR TEST    Use to test blood sugar 3 times daily.Dx code 250.00    HYDROcodone-acetaminophen 5-325 MG per tablet    Commonly known as: NORCO/VICODIN    Take 1-2 tablets by mouth every 6 (six) hours as needed for pain.    insulin detemir 100 UNIT/ML injection    Commonly known as: LEVEMIR    Inject 60 Units into the skin at bedtime.    Insulin Pen Needle 31G X 5 MM Misc    Commonly known as: B-D UF III MINI PEN NEEDLES    DX: 250.02  Please check blood sugars 3 times daily.    INSULIN SYRINGE .3CC/31GX5/16" 31G X 5/16" 0.3 ML Misc    1 Syringe by Does not apply route 2 (two) times daily at 10 AM and 5  PM.    levofloxacin 750 MG tablet    Commonly known as: LEVAQUIN    Take 1 tablet (750 mg total) by mouth daily.    Start taking on: 05/08/2012    lisinopril 10 MG tablet    Commonly known as: PRINIVIL,ZESTRIL    Take 1 tablet (10 mg total) by mouth daily.    loratadine 10 MG tablet    Commonly known as: CLARITIN    Take 10 mg by mouth daily with supper.    NOVOLOG FLEXPEN 100 UNIT/ML injection    Generic drug: insulin aspart    Inject 13-21 Units into the skin See admin instructions. Takes 13 units with meals and also uses SSI with meals in addition to the 13 units of insulin    nystatin 100000 UNIT/GM Powd    Apply 1 g topically daily as needed (; apply to stomach and under vreasts).    omeprazole 40 MG capsule    Commonly known as: PRILOSEC    Take 1 capsule (40 mg total) by mouth 2 (two) times daily.    potassium chloride SA 20 MEQ tablet    Commonly known as: K-DUR,KLOR-CON    Take 20-40 mEq by mouth 2 (two) times daily. 2 tablets in the Morning and 1 tablet at night    predniSONE 20 MG tablet    Commonly known as: DELTASONE    Take 2 tablets (40 mg total) by mouth daily with breakfast.    Start taking on: 05/08/2012    rosuvastatin 20 MG tablet    Commonly known as: CRESTOR    Take 1 tablet (20 mg total) by mouth daily with supper.    sennosides-docusate sodium 8.6-50 MG tablet    Commonly known as: SENOKOT-S    Take 2 tablets by mouth every other day as needed for constipation. Hold for loose stools    tiotropium 18 MCG inhalation capsule    Commonly known as: SPIRIVA    Place 1 capsule (18 mcg total) into inhaler and inhale daily with breakfast.    TruForm Stockings 20-60mHg Misc    Wear compression stockings throughout the day, and take off at nighttime.      CONSULTS:  Pulmonary critical care was consulted given concern for respiratory distress on 3/26.  However, pt's respiratory status improved and PCCM signed off on 3/27 as no interventions were needed.    PROCEDURES PERFORMED:  Dg Chest 2 View (3/24)  Findings: Two views of the chest were obtained. Plate and screw fixation of the right clavicle is again noted. Stable elevation of the left hemidiaphragm. The lungs are clear. The patient has median sternotomy wires. Heart size is stable and normal. IMPRESSION: No acute chest findings.   Dg Chest Port 1 View (3/26)  IMPRESSION: No interval change   Dg Chest Port 1 View (3/25)  IMPRESSION: Peribronchial thickening and increased interstitial markings likely reflecting bronchitis. Cannot exclude mild interstitial edema   2D Echocardiogram  Study Conclusions - Left ventricle: The cavity size was normal. Wall thickness was normal. Systolic function was normal. The estimated ejection fraction was in the range of 60% to 65%. There was an increased relative contribution of atrial contraction to ventricular filling. - Mitral valve: Valve area by continuity equation (using LVOT flow): 1.09cm^2. - Left atrium: The atrium was mildly dilated. Impressions: This is a very technically difficult study.  ADMISSION DATA:  H&P:  62yo F with significant PMH consisting of HTN, DM II, CHF (EF 55-65%, mild LVH, normal systolic fx, grade 2 diastolic dysfunction from 12/2010 ECHO) and COPD on home O2 presents with SOB, chest tightness, dyspnea, and productive cough x3-4 days. She was actually seen in the ED yesterday around midday but left after a breathing treatment and steroids. She was given a prescription for Azithromycin but did not have it filled. This morning, she reports mild yellow/white mucus, sinus pressure, and nasal congestion. She states that she has been using her inhalers at home without relief from her respiratory symptoms.   Physical Exam:  Blood pressure 124/70, pulse 105, temperature 98 F (36.7 C), temperature source Axillary, resp. rate 21, SpO2 99.00%.  General: Alert, morbidly obese female, appears to be in respiratory distress  Head:  Normocephalic and atraumatic.  Eyes: Pupils equal, round, and reactive to light, no injection and anicteric.  Mouth: Pharynx pink and moist Neck: Supple, full ROM, TTP of cervical spine  Lungs: CTAB, with mild course breath sounds on end-expiration at LUL, no accessory muscle use, moderate respiratory distress  Heart: Tachycardic, regular rhythm, no murmur, no gallop, and no rub.  Abdomen: Soft, non-tender, non-distended  Msk: No joint swelling, warmth, or erythema.  Extremities: 2+ radial and 1+DP pulses bilaterally. Trace pitting edema of BLE.  Neurologic: Alert & oriented X3, nonfocal  Skin: Turgor normal and no rashes.  Psych: Memory intact for recent and remote, appears to be in distress but in good spirits.   Labs:  Basic Metabolic Panel:  Recent Labs   05/03/12 1104  05/04/12 0035   NA  139  139   K  4.9  3.7   CL  99  100   CO2  31  --   GLUCOSE  216*  288*   BUN  26*  32*   CREATININE  1.16*  1.20*   CALCIUM  10.2  --   Liver Function Tests:  Recent Labs   05/03/12 1104   AST  16   ALT  17   ALKPHOS  63   BILITOT  0.3   PROT  7.3   ALBUMIN  3.8   CBC:  Recent Labs   05/03/12 1104  05/04/12 0021  05/04/12 0035   WBC  7.1  7.7  --   NEUTROABS  4.8  5.6  --  HGB  13.7  13.2  13.9   HCT  39.9  38.6  41.0   MCV  92.8  91.9  --   PLT  175  175  --   Urinalysis:  Recent Labs   05/03/12 [place]   LABSPEC  1.019   PHURINE  5.5   [place]  NEGATIVE   PROTEINUR  NEGATIVE   UROBILINOGEN  0.2   NITRITE  NEGATIVE   LEUKOCYTESUR  NEGATIVE    HOSPITAL COURSE:  Ms. Shimmel is a 62yo female who presented with shortness of breath and was admitted to the medicine teaching service 3/25 for further evaluation and management   # Acute on Chronic Respiratory failure due to COPD Exacerbation: At the time of admission, the pt appeared to be in respiratory distress and endorsed shortness  of breath and chest tightness in the setting of a productive cough. She was mildly tachypnic and tachycardic but SpO2 wnl on baseline O2. WBC wnl, BNP mildly elevated, CE negative, CXR showed no acute process of infiltrate. She was treated for suspected COPD exacerbation with steroids (5 day course), Levaquin (5 day course), and duonebs. Pulmonary critical care consulted, and raised concern about respiratory status due to increased work of breathing on hospital day 2 and considered intubation. However, pt improved and intubation was not required. Respiratory status improved over course of hospitalization and at the time of discharge she was saturating well on home oxygen (1-2L). At the time of discharge she had completed 4 days of Levaquin and Prednisone, and was instructed to finish a 5 day course of each.   # Diastolic CHF: At the time of admission the pt's BNP was mildly elevated, but physical exam showed no evidence of fluid overload and CXR showed no evidence of acute process. The pt's home lasix dose was continued initially, and additional doses were provided as respiratory function worsened shortly after admission given concern for fluid overload. However, echocardiogram revealed normal systolic function. The pt's respiratory function improved over the course of the hospitalization and the pt was discharged on her home lasix regimen.   # TII DM. At the time of admission the pt had insulin-dependent TII DM (home dose 60U Levemir qhs, 13U mealtime insulin ). HgbA1c was 8.4. Her home insulin dose was decreased initially and slowly increased as she began eating and steroid therapy was initiated. Glucose control improved over the course of the hospitalization and was well-controlled at the time of discharge on her home insulin regimen.   # HTN. The pt's BP at the time of admission was controlled with lisinopril and lasix. Lisinopril was held given elevated Cr at admission (1.20). Lasix was held for 2 days as  Cr increased and blood pressures were low-normal. However, at the time of discharge her BP and Cr had improved and she was discharged on her home BP regimen.   # Fibromyalgia- The pt's home medication (Gabapentin) was continued. She is on chronic narcotics at home, which were held initially given concern for respiratory depression. However, the pt developed headache, tremor, and diaphoresis 2 days afterwards and withdrawal was suspected (narcotics vs Xanex withdrawal). Narcotics and Xanex were continued and the pt's symptoms had improved at the time of discharge.   # Depression/Anxiety- The pt's home medications (Buproprion, Duloxetine) were continued throughout the hospitalization. Her Berenice Primas was initially held given concern for somnolence and decreased respiratory function.  It was re-initiated at half of her home dose (0.33m tid), and she was instructed to continue her home dose following discharge   # Dyslipidemia. The pt's home medication (Atorvastatin) was continued throughout the hospitalization.   # OSA- Respiratory therapy was consulted, and CPAP was continued at the pt's home settings throughout the hospitalization.    DISCHARGE DATA:  Vital Signs:  BP 138/59  Pulse 76  Temp(Src) 98.3 F (36.8 C) (Oral)  Resp 21  Wt 83.7 kg (184 lb 8.4 oz)  BMI 33.74 kg/m2  SpO2 97%   Labs:  BMET    Component  Value  Date/Time    NA  136  05/07/2012 0744    K  3.4*  05/07/2012 0744    CL  97  05/07/2012 0744    CO2  30  05/07/2012 0744    GLUCOSE  101*  05/07/2012 0744    BUN  29*  05/07/2012 0744    CREATININE  1.16*  05/07/2012 0744    CREATININE  0.94  11/25/2011 1054    CALCIUM  9.1  05/07/2012 0744    GFRNONAA  49*  05/07/2012 0744    GFRAA  57*  05/07/2012 0744    CBG (last 3)   Recent Labs   05/06/12 1739  05/06/12 2118  05/07/12 0748   GLUCAP  170*  120*  110*    Time spent on discharge: 666m

## 2012-05-07 NOTE — Progress Notes (Signed)
Chaplain responded to pt's request for a visit from a chaplain. Pt was complaining of severe headache. She had been given pain medicine but still had pain. Pt had spiritual and family concerns to share. Chaplain provided emotional and spiritual support for pt. Chaplain prayed with pt and gave her a copy of the New Testament and Psalms. Pt expressed appreciation for chaplain's visit. Pt was told she will be moving soon to [address].

## 2012-05-07 NOTE — Care Management Note (Signed)
    Page 1 of 1   05/07/2012     2:43:12 PM   CARE MANAGEMENT NOTE 05/07/2012  Patient:  [name], [name]   Account Number:  [id number]  Date Initiated:  05/04/2012  Documentation initiated by:  [facility]  Subjective/Objective Assessment:   Admitted with SOB requiring bipap.     Action/Plan:   Anticipated DC Date:  05/07/2012   Anticipated DC Plan:  [place]  CM consult      [name] Choice  HOME HEALTH   Choice offered to / List presented to:  C-1 Patient        [place] arranged  [place] PT      [place].   Status of service:  Completed, signed off Medicare Important Message given?   (If response is "NO", the following Medicare IM given date fields will be blank) Date Medicare IM given:   Date Additional Medicare IM given:    Discharge Disposition:  [place]  Per UR Regulation:  Reviewed for med. necessity/level of care/duration of stay  If discussed at [place] of Stay Meetings, dates discussed:    Comments:  ContactEtta Grandchild Sister [place] Daughter [phone]  05/07/12 14:41 Tomi Bamberger RN, BSN [phone] patient lives alone, per physical therapy patient is refusing snf and wants to go home with hh, patient chose [facility] , since she has had them beforel, referral made to [facility], Butch Penny notified for HHPt,  Soc will begin 24-48 hrs post discharge.

## 2012-05-07 NOTE — Evaluation (Signed)
Physical Therapy Evaluation Patient Details Name: [name] MRN: [id number] DOB: 10-26-1950 Today's Date: 05/07/2012 Time: 0912-0928 PT Time Calculation (min): 16 min  PT Assessment / Plan / Recommendation Clinical Impression  Pt adm with acute respiratory failure.  Needs skilled PT to maximize I and safety so pt can return home.  Feel pt could benefit from ST-SNF to improve balance and endurance but pt does not want SNF therefore recommend HHPT.    PT Assessment  Patient needs continued PT services    Follow Up Recommendations  Home health PT;SNF    Does the patient have the potential to tolerate intense rehabilitation      Barriers to Discharge Decreased caregiver support      Equipment Recommendations  None recommended by PT    Recommendations for Other Services     Frequency Min 3X/week    Precautions / Restrictions Precautions Precautions: Fall   Pertinent Vitals/Pain Amb on 2L O2      Mobility  Bed Mobility Bed Mobility: Supine to Sit;Sitting - Scoot to Edge of Bed Supine to Sit: 6: Modified independent (Device/Increase time);HOB elevated Sitting - Scoot to Edge of Bed: 6: Modified independent (Device/Increase time) Transfers Transfers: Sit to Stand;Stand to Sit Sit to Stand: 5: Supervision;With upper extremity assist;From bed Stand to Sit: 5: Supervision;With upper extremity assist;With armrests;To chair/3-in-1 Ambulation/Gait Ambulation/Gait Assistance: 5: Supervision;4: Min assist Ambulation Distance (Feet): 150 Feet Assistive device: None;Other (Comment) (wall rail) Ambulation/Gait Assistance Details: Pt able to amb supervision in room but when amb in hall required min A due to decr balance as pt fatigued.  Pt reports she "furniture walks" at home. Took 2 standing rest breaks. Gait Pattern: Step-through pattern;Decreased stride length Gait velocity: decr    Exercises     PT Diagnosis: Difficulty walking;Generalized weakness  PT Problem List:  Decreased strength;Decreased activity tolerance;Decreased balance;Decreased mobility PT Treatment Interventions: DME instruction;Gait training;Patient/family education;Functional mobility training;Therapeutic activities;Therapeutic exercise;Balance training   PT Goals Acute Rehab PT Goals PT Goal Formulation: With patient Time For Goal Achievement: 05/14/12 Potential to Achieve Goals: Good Pt will go Sit to Stand: with modified independence PT Goal: Sit to Stand - Progress: Goal set today Pt will go Stand to Sit: with modified independence PT Goal: Stand to Sit - Progress: Goal set today Pt will Ambulate: 51 - 150 feet;with modified independence;with least restrictive assistive device PT Goal: Ambulate - Progress: Goal set today  Visit Information  Last PT Received On: 05/07/12 Assistance Needed: +1    Subjective Data  Subjective: "I want to go home," pt stated Patient Stated Goal: Go home   Prior Functioning  Home Living Lives With: Alone Available Help at Discharge: Family;Available PRN/intermittently Type of Home: House Home Access: Stairs to enter [employer] of Steps: 1 Home Layout: One level Bathroom Shower/Tub: Chiropodist: Handicapped height Home Adaptive Equipment: Shower chair with back;Grab bars around toilet;Grab bars in shower;Straight cane;Walker - rolling Additional Comments: Home O2 Prior Function Level of Independence: Independent with assistive device(s) (uses cane when out) Vocation: On disability Communication Communication: No difficulties    Cognition  Cognition Overall Cognitive Status: Appears within functional limits for tasks assessed/performed Arousal/Alertness: Awake/alert Orientation Level: Appears intact for tasks assessed Behavior During Session: [facility] for tasks performed    Extremity/Trunk Assessment Right Lower Extremity Assessment RLE ROM/Strength/Tone: Deficits RLE ROM/Strength/Tone Deficits: grossly  4/5 Left Lower Extremity Assessment LLE ROM/Strength/Tone: Deficits LLE ROM/Strength/Tone Deficits: grossly 4/5   Balance Balance Balance Assessed: Yes Static Standing Balance Static Standing -  Balance Support: No upper extremity supported Static Standing - Level of Assistance: 5: Stand by assistance  End of Session PT - End of Session Equipment Utilized During Treatment: Oxygen Activity Tolerance: Patient limited by fatigue Patient left: in chair;with call bell/phone within reach Nurse Communication: Mobility status  GP     [facility] 05/07/2012, 9:55 AM  [facility] PT [phone]

## 2012-05-07 NOTE — Progress Notes (Signed)
Notified Eulas Post, MD that patient sitting on bedside commode having abdominal cramping and has not had BM since Sunday. MD stated she would put in order for enema. Will continue to monitor patient. Hassan Buckler

## 2012-05-10 LAB — CULTURE, BLOOD (ROUTINE X 2)
Culture: NO GROWTH
Culture: NO GROWTH

## 2012-05-11 ENCOUNTER — Ambulatory Visit (INDEPENDENT_AMBULATORY_CARE_PROVIDER_SITE_OTHER): Payer: Medicare Other | Admitting: Dietician

## 2012-05-11 ENCOUNTER — Encounter (HOSPITAL_COMMUNITY): Payer: Medicare Other

## 2012-05-11 ENCOUNTER — Ambulatory Visit (INDEPENDENT_AMBULATORY_CARE_PROVIDER_SITE_OTHER): Payer: Medicare Other | Admitting: Internal Medicine

## 2012-05-11 ENCOUNTER — Encounter: Payer: Self-pay | Admitting: Dietician

## 2012-05-11 VITALS — BP 119/63 | HR 83 | Temp 97.3°F | Resp 24 | Ht 61.5 in | Wt 181.2 lb

## 2012-05-11 DIAGNOSIS — E118 Type 2 diabetes mellitus with unspecified complications: Secondary | ICD-10-CM

## 2012-05-11 DIAGNOSIS — E119 Type 2 diabetes mellitus without complications: Secondary | ICD-10-CM

## 2012-05-11 DIAGNOSIS — R252 Cramp and spasm: Secondary | ICD-10-CM

## 2012-05-11 DIAGNOSIS — F411 Generalized anxiety disorder: Secondary | ICD-10-CM

## 2012-05-11 DIAGNOSIS — J449 Chronic obstructive pulmonary disease, unspecified: Secondary | ICD-10-CM

## 2012-05-11 DIAGNOSIS — I10 Essential (primary) hypertension: Secondary | ICD-10-CM

## 2012-05-11 DIAGNOSIS — E1165 Type 2 diabetes mellitus with hyperglycemia: Secondary | ICD-10-CM

## 2012-05-11 DIAGNOSIS — IMO0002 Reserved for concepts with insufficient information to code with codable children: Secondary | ICD-10-CM

## 2012-05-11 DIAGNOSIS — J4489 Other specified chronic obstructive pulmonary disease: Secondary | ICD-10-CM

## 2012-05-11 DIAGNOSIS — D649 Anemia, unspecified: Secondary | ICD-10-CM

## 2012-05-11 DIAGNOSIS — K219 Gastro-esophageal reflux disease without esophagitis: Secondary | ICD-10-CM

## 2012-05-11 DIAGNOSIS — F419 Anxiety disorder, unspecified: Secondary | ICD-10-CM

## 2012-05-11 LAB — BASIC METABOLIC PANEL WITH GFR
BUN: 30 mg/dL — ABNORMAL HIGH (ref 6–23)
CO2: 31 mEq/L (ref 19–32)
Calcium: 9 mg/dL (ref 8.4–10.5)
Chloride: 97 mEq/L (ref 96–112)
Creat: 1.32 mg/dL — ABNORMAL HIGH (ref 0.50–1.10)
GFR, Est African American: 50 mL/min — ABNORMAL LOW
GFR, Est Non African American: 43 mL/min — ABNORMAL LOW
Glucose, Bld: 179 mg/dL — ABNORMAL HIGH (ref 70–99)
Potassium: 4.7 mEq/L (ref 3.5–5.3)
Sodium: 137 mEq/L (ref 135–145)

## 2012-05-11 LAB — GLUCOSE, CAPILLARY: Glucose-Capillary: 172 mg/dL — ABNORMAL HIGH (ref 70–99)

## 2012-05-11 MED ORDER — ALPRAZOLAM 1 MG PO TABS: ORAL_TABLET | ORAL | Status: DC

## 2012-05-11 MED ORDER — INSULIN DETEMIR 100 UNIT/ML ~~LOC~~ SOLN: 62.0000 [IU] | Freq: Every day | SUBCUTANEOUS | Status: DC

## 2012-05-11 MED ORDER — INSULIN ASPART 100 UNIT/ML ~~LOC~~ SOLN: SUBCUTANEOUS | Status: DC

## 2012-05-11 MED ORDER — HYDROCOD POLST-CHLORPHEN POLST 10-8 MG/5ML PO LQCR: 5.0000 mL | Freq: Two times a day (BID) | ORAL | Status: DC | PRN

## 2012-05-11 NOTE — Progress Notes (Signed)
Medical Nutrition Therapy:  Appt start time: 930 end time:  1000. Last visit 03/14/2011 Assessment:  Primary concerns today: Blood sugar control Usual eating pattern includes Meals 2-3 and ~2 snacks per day.   Weight stable, intake adequate in calories.  Doing well on Levemir 60 units +average of 3 days ~ 45 units Novolog/day ( 13 each meal +2 units for each 50 mg/dl >150). Counts carbs. Keeping meals consistent.  Recalculated insulin needs: used Total daily dose of ~ 105 units insulin per day.  Patient currently taking 45  u nits levemir and ~ 27 unit units Novolog each day for an actual total of 72-74 units each day. ICR is 1:4 and Correction 1: 17.  .    Progress Towards Goal(s):  In progress   Nutritional Diagnosis:   NB-1.1 Food and nutrition-related knowledge deficit As related to lack of previous information about nutrient density as evidenced by her comments and our discussion today..     Interventions: 1- Recalculated insulin to carb ratio is 1:4- patient meals with ~ 40-60 gram carb so likely needs increased amount of prandail insulin. Suggest increase to 14-15 units each meal. Correction factor is 17. ~ 3 units for every 50 mg/dl 2- Reviewed meter download with patient: shows small elevations through daytime indicating inadequate meal coverage and correction insulin. 3- Coordination of care- Discussed with DR. Guy Sandifer  Monitoring/Evaluation:  Dietary intake, blood sugars and insulin amounts in 2 week(s)

## 2012-05-11 NOTE — Patient Instructions (Addendum)
To increase the nutrition in your food:   Use milk instead of water in your oatmeal.   Drink one cup of milk a day instead of one of your diet Pepsi's.   Use lower fat lunch meat instead of bologna- [place], ham, chicken, tuna, cottage cheese, real cheese like (cheddar,  mozzerella, provolone, colby jack), peanut butter (natural is healthy!)   Bananas can go on your peanut butter sandwich and can be frozen without their peel  Buy fruit in season, use 4 oz orange juice(100% juice) each day, can keep frozen as back up.

## 2012-05-11 NOTE — Assessment & Plan Note (Signed)
Assessment: COPD exac resolved. At baseline, has O2 dependent COPD. She was hospitalized from 3/25-3/27 for COPD exac in setting of bronchitis - already tx with Levaquin and prednisone x 5 days. Sx better except for cough, which is not unexpected. Lung exam okay today. Will not extend Abx or prednisone therapy. Will instead add antitussive.  Plan:      Continue current regimen.  Add tussionex - advised of increased sedative effects with this medication - particularly in combo with her chronic narcotics. Advised to avoid if getting too sleepy with the combination and to avoid driving/ alcohol/ heavy machinery. She verbalized understanding.

## 2012-05-11 NOTE — Progress Notes (Addendum)
Patient: [name]   MRN: [id number]  DOB: 1950-07-27  PCP: Annamarie Dawley, DO   Subjective:    HPI: [name] is a 62 y.o. female with a PMHx as outlined below, who presented to clinic today for the following:  1) Hospital follow-up - Patient was hospitalized at [facility] from March 25 - 27, 2014 for evaluation of the following:  COPD exacerbation - pt was admitted with symptoms of shortness of breath, cough - thought to be secondary COPD exacerbation possibly in setting of bronchitis. Was treated with levaquin x 5 day, and completed prednisone course. Since hospital discharge, patient does note significant improvement of symptoms - except for cough - which is productive of clear frothy sputum. is taking recommended medications regularly. Today, the patient describes had coughing fit overnight that lasted for several hours and made her feel short of breath. That was the only time she has felt this way. Otherwise, no fevers, chills, nausea, vomiting, eating and drinking well, still constipated, but improved.  2) DM2, uncontrolled - Goal 8 Lab Results  Component Value Date   HGBA1C 8.4* 05/04/2012   Patient checking blood sugars 2-3 times daily, before breakfast, lunch, and dinner. Reports fasting blood sugars of < 200 mg/dL - 170s mg/dL. Currently taking Levemir 60 units at bedtime, 13 units with meals  + sliding scale insulin of 1:50 over 150 mg/dL Patient misses doses 0 x per week on average.  0 hypoglycemic episodes since last visit, last symptomatic hypoglycemia several years ago. admits to polyuria, polydipsia because is on lasix. Denies nausea, vomiting, diarrhea.   In regards to diabetic complications:  Microvascular complications: Confirms: peripheral neuropathy ; Denies nephropathy and retinopathy.  Macrovascular complications: Confirms: cerebrovascular disease and peripheral vascular disease ; Denies cardiovascular disease.   Important diabetic medications: Is  patient on aspirin? Yes Is patient on statin? Yes Is patient on ARB? Yes   Review of Systems: Per HPI.   Current Outpatient Medications: Medication Sig  . ACCU-CHEK FASTCLIX LANCETS MISC 1 each by Does not apply route 3 (three) times daily before meals. Check blood sugars 3 times daily before breakfast, lunch, and dinner DX code: 250.02  . albuterol (PROVENTIL HFA;VENTOLIN HFA) 108 (90 BASE) MCG/ACT inhaler Inhale 2 puffs into the lungs every 6 (six) hours as needed. For wheeze or shortness of breath  . alendronate (FOSAMAX) 70 MG tablet Take 70 mg by mouth every 7 (seven) days. Takes on wednesdays  . ALPRAZolam (XANAX) 1 MG tablet Take 1 tablet (1 mg total) by mouth 3 (three) times daily as needed for sleep or anxiety.  Marland Kitchen aspirin EC 81 MG tablet Take 81 mg by mouth every evening.  Marland Kitchen buPROPion (WELLBUTRIN XL) 150 MG 24 hr tablet Take 1 tablet (150 mg total) by mouth daily with supper.  . calcium carbonate (OS-CAL) 600 MG TABS Take 1,200 mg by mouth daily.  Marland Kitchen docusate sodium (COLACE) 50 MG capsule Take 100 mg by mouth every morning. Can Alternate with  senokot  . DULoxetine (CYMBALTA) 30 MG capsule Take 90 mg by mouth daily with breakfast.   . Elastic Bandages & Supports (TRUFORM STOCKINGS 20-30MMHG) MISC Wear compression stockings throughout the day, and take off at nighttime.  . ferrous gluconate (FERGON) 324 MG tablet Take 1 tablet (324 mg total) by mouth 2 (two) times daily with a meal.  . Fluticasone-Salmeterol (ADVAIR DISKUS) 250-50 MCG/DOSE AEPB Inhale 1 puff into the lungs every 12 (twelve) hours.  . furosemide (LASIX) 80  MG tablet Take 80 mg by mouth 2 (two) times daily.  Marland Kitchen gabapentin (NEURONTIN) 300 MG capsule Take 300 mg by mouth 2 (two) times daily.  Marland Kitchen glucose blood (BAYER CONTOUR TEST) test strip Use to test blood sugar 3 times daily.Dx code 250.00  . HYDROcodone-acetaminophen (NORCO/VICODIN) 5-325 MG per tablet Take 1-2 tablets by mouth every 6 (six) hours as needed for pain.    Marland Kitchen insulin aspart (NOVOLOG FLEXPEN) 100 UNIT/ML injection Inject 13-21 Units into the skin See admin instructions. Takes 13 units with meals and also uses SSI with meals in addition to the 13 units of insulin  . insulin detemir (LEVEMIR) 100 UNIT/ML injection Inject 60 Units into the skin at bedtime.  . Insulin Pen Needle (B-D UF III MINI PEN NEEDLES) 31G X 5 MM MISC DX: 250.02 Please check blood sugars 3 times daily.  . Insulin Syringe-Needle U-100 (INSULIN SYRINGE .3CC/31GX5/16") 31G X 5/16" 0.3 ML MISC 1 Syringe by Does not apply route 2 (two) times daily at 10 AM and 5 PM.  . lisinopril (PRINIVIL,ZESTRIL) 10 MG tablet Take 1 tablet (10 mg total) by mouth daily.  Marland Kitchen loratadine (CLARITIN) 10 MG tablet Take 10 mg by mouth daily with supper.  . nystatin (MYCOSTATIN/NYSTOP) 100000 UNIT/GM POWD Apply 1 g topically daily as needed (; apply to stomach and under vreasts).  Marland Kitchen omeprazole (PRILOSEC) 40 MG capsule Take 1 capsule (40 mg total) by mouth 2 (two) times daily.  . potassium chloride SA (K-DUR,KLOR-CON) 20 MEQ tablet Take 20-40 mEq by mouth 2 (two) times daily. 2 tablets in the  Morning and 1 tablet at night  . rosuvastatin (CRESTOR) 20 MG tablet Take 1 tablet (20 mg total) by mouth daily with supper.  . sennosides-docusate sodium (SENOKOT-S) 8.6-50 MG tablet Take 2 tablets by mouth every other day as needed for constipation. Hold for loose stools  . Spacer/Aero-Holding Chambers (AEROCHAMBER MV) inhaler Use as instructed with inhalers.  . tiotropium (SPIRIVA) 18 MCG inhalation capsule Place 1 capsule (18 mcg total) into inhaler and inhale daily with breakfast.    Allergies  Allergen Reactions  . Lorazepam     Patient's sister noted that ativan caused the patient to become extremely confused during hospitalization 09/2010  . Morphine     REACTION: Unknown reaction  . Oxycontin (Oxycodone) Other (See Comments)    headache  . Tramadol Hcl     REACTION: swelling    Past Medical History   Diagnosis Date  . Depression   . GERD (gastroesophageal reflux disease)     Followed by [name] // small bowel capsule endoscopy (11/2009) - minimal chronic inflammation, otherwise negative for  H.pylori, metaplasia, dysplasia  . Hypertension   . Hyperlipidemia   . Diabetes mellitus type II, uncontrolled     on insulin therapy  . COPD (chronic obstructive pulmonary disease) 2008    started home O2 (12/2010) // (+) significant tobacco abuse  . Moderate to severe pulmonary hypertension     severe per TEE (08/2010) - Peak RV-RA gradient 7m Hg  . Mitral regurgitation     mild to moderate per TEE (08/2010)  . Shingles   . Iron deficiency anemia     Prior BL Hgb 9-11. Source initially unclear with EGD/Colonoscopy (05/2011) not indicative of active bleeding source // DX with RCC in 07/2011, s/p cryoablation - with subsequent normalization of Hgb. (2014)  . History of colonic polyps     Colonoscopy (10/2005) - colon polyp with path nonadenomatous, no malignancy -  by [name]. // Colonoscopy (02/2002) - 2 polyps noted, 1 - adenomatous, 2 - hyperplastic // Followed by [name] // Repeat Colonoscopy (05/2011) - single tubular adenoma polyp - performed by Dr. Michail Sermon  . Anxiety   . IBS (irritable bowel syndrome)   . Osteoporosis     DEXA (12/2004) - L-spine T score -2.6, Left hip -0.1  . Fibromyalgia   . Obstructive sleep apnea     on CPAP // Sleep study (06/2009) - Moderate sleep apnea/ hypopnea syndrome , AHI 17.8 per hour with nonpositional hypopneas. CPAP titration to 12 CWP, AHI 2.4 per hour. Small resMed Quattro full-face mask with heated humidifier  . Restless leg syndrome   . Mitral stenosis     moderate per TEE (08/2010) // Mitral valve replacement with a 27-mm pericardial porcine valve (Medtronic Mosaic valve, serial #[id number]) on 09/20/2010 - [name] // Followed at [facility], Dr. Stanford Breed  . Bilateral carotid artery stenosis     s/p right endarterectomy  (06/2010). // Carotid US (07/2010) -  60%-79% ICA stenosis, mid range of scale by velocity.  Left: Moderate-to-severe calcific and non-calcific plaque origin andproximal ICA and ECA.  60%-79% ICA stenosis, highest end of scale // Followed by [name].  . Peripheral vascular disease   . Internal hemorrhoids     small internal hemorrhoids noted on colonoscopy 05/2011  . Clear cell renal cell carcinoma 07/21/2011    S/P cryoablation of left RCC in 09/2011 by [name] // Historically - MRI Abdomen (07/2011) - 2.7 x 2.7 x 3.0 cm enhancing solid renal mass posterior aspect of the lower pole of the left kidney. // CT Abdomen in 09/2010 showed no evidence of mass // Followed by [name]  [facility])   . Diastolic CHF     last echo (12/2010) showing grade 2 diastolic dysfunction. Otherwise, normal EF of 55-65%  . H/O mitral valve replacement 09/30/2011  . A-fib 10/22/2010    S/P Left atrial maze procedure for paroxysmal atrial fibrillation. 09/20/2010 Dr Prescott Gum     Past Surgical History  Procedure Laterality Date  . Orif clavicle fracture  01/2004    by Thana Farr. [name], M.D for Right clavicle nonunion.  . Tonsillectomy    . Carotid endarterectomy  07/04/2010    right, by [name] for asymptomatic right carotid artery stenosis  . Lipoma exision  08/2005    occipital lipoma 1.5cm - by Dr. [name]  . Hysteroscopy with endometrial ablation  06/2001    for persistent post-menopausal bleeding // by S. [name], M.D.  . Mitral valve replacement  09/20/10     with a 27-mm pericardial porcine valve (Medtronic Mosaic valve, serial #[id number]). 09/20/10, Dr Prescott Gum  . Left atrial maze procedure  09/20/10    for paroxysmal atrial fibrillation (Dr. Prescott Gum)  . Placement of left chest tube.  09/24/2010    Dr Prescott Gum  . Pr vein bypass graft,aorto-fem-pop  May 2012  . Cardiac valve replacement  Aug. 2012  . Colonoscopy  05/12/2011    performed by Dr. Michail Sermon. Showing small internal  hemorrhoids, single tubular adenoma polyp  . Esophagogastroduodenoscopy  05/12/2011    performed by Dr. Michail Sermon. Negative for ulcerations, biopsy negative for evidence of celiac sprue  . Tubal ligation       Objective:    Physical Exam: Filed Vitals:   05/11/12 0920  BP: 119/63  Pulse: 83  Temp: 97.3  Resp: 24    General: Vital signs  reviewed and noted. Well-developed, well-nourished, in no acute distress; alert, appropriate and cooperative throughout examination.  Head: Normocephalic, atraumatic.  Nose: Mucous membranes moist, not inflammed, nonerythematous.  Throat: Oropharynx nonerythematous, no exudate appreciated.   Neck: No deformities, masses, or tenderness noted.  Lungs:  Normal respiratory effort. Mildly decreased breath sounds diffusely, otherwise clear to auscultation BL without crackles or wheezes.  Heart: RRR. S1 and S2 normal without gallop, rubs. (+) murmur.  Abdomen:  BS normoactive. Soft, Nondistended, non-tender.  No masses or organomegaly.  Extremities: No pretibial edema.    Assessment/ Plan:    The patient's case and plan of care was discussed with attending physician, Dr. [name].

## 2012-05-11 NOTE — Patient Instructions (Addendum)
General Instructions:  Please follow-up at the clinic in 3 weeks, at which time we will reevaluate your diabetes, cough - OR, please follow-up in the clinic sooner if needed.  There have been changes in your medications:  INCREASE Your Levemir to 62 units   INCREASE your meal coverage to 14 units with meals + sliding scale  DECREASE your Alprazolam to 0.61m up to twice daily as needed for anxiety and 1 mg at bedtime for sleep.  START Tussionex as needed for your cough - You have been started on a new medication that can cause drowsiness, do not drive or operate heavy machinery . Do not take this medication with alcohol.     If you have been started on new medication(s), and you develop symptoms concerning for allergic reaction, including, but not limited to, throat closing, tongue swelling, rash, please stop the medication immediately and call the clinic at [phone] and go to the ER.  If you are diabetic, please bring your meter to your next visit.  If symptoms worsen, or new symptoms arise, please call the clinic or go to the ER.  PLEASE BRING ALL OF YOUR MEDICATIONS  IN A BAG TO YOUR NEXT APPOINTMENT   Treatment Goals:  Goals (1 Years of Data) as of 05/11/12         As of Today As of Today 05/07/12 05/07/12 05/06/12     Blood Pressure    . Blood Pressure < 140/90  119/63 106/63 137/74 123/87 156/75     Result Component    . HEMOGLOBIN A1C < 7.0          . LDL CALC < 70            Progress Toward Treatment Goals:  Treatment Goal 02/02/2012  Hemoglobin A1C improved  Blood pressure at goal  Stop smoking smoking less    Self Care Goals & Plans:  Self Care Goal 05/11/2012  Manage my medications take my medicines as prescribed; bring my medications to every visit; refill my medications on time  Monitor my health keep track of my blood glucose; bring my glucose meter and log to each visit  Eat healthy foods drink diet soda or water instead of juice or soda; eat more  vegetables; eat baked foods instead of fried foods  Be physically active find an activity I enjoy    Home Blood Glucose Monitoring 02/02/2012  Check my blood sugar 3 times a day  When to check my blood sugar before breakfast; before lunch; before dinner     Care Management & Community Referrals:

## 2012-05-11 NOTE — Assessment & Plan Note (Signed)
Assessment: Anxiety stable on her Xanax. Is not requiring 63m TID. Therefore, will decrease to 0.5122mBID PRN and 22m36mt bedtime. New med contract completed.  Plan:      New med contract completed and copy given to pt.

## 2012-05-11 NOTE — Assessment & Plan Note (Signed)
Pertinent Labs: Lab Results  Component Value Date   HGBA1C 8.4* 05/04/2012   HGBA1C 8.0 02/02/2012   CREATININE 1.32* 05/11/2012   CREATININE 1.16* 05/07/2012   MICROALBUR 0.59 05/29/2011   MICRALBCREAT 13.4 05/29/2011   CHOL 130 10/23/2011   HDL 33* 10/23/2011   TRIG 317* 10/23/2011    Assessment: Disease Control: not controlled  Progress toward goals: deteriorated  Barriers to meeting goals: no barriers identified  Microvascular complications: peripheral neuropathy  Macrovascular complications: cerebrovascular disease and peripheral vascular disease  On aspirin: yes  On statin: Yes  On ACE-I/ARB: Yes    Appreciate Donna's input.  Patient now taking 13units TIDWC + TID SSI + Levemir 60 units - is still having fasting hyperglycemia with CBG in 150-160s and up to 200s during day time prior to meals. Will therefore slightly increase both insulins, as below.  Patient is compliant most of the time with prescribed medications.   Plan: Glucometer log was reviewed today, as pt did have glucometer available for review.   Escalate Levemir to 62 units qday - instructed to decrease back to 60 if getting blood sugars < 70 mg/dL in AM  Increase meal coverage to 14 units TIDWC + SSI.  reminded to bring blood glucose meter & log to each visit  Will have pt see Butch Penny prior to next visit, to help with adjustment of her insulin.

## 2012-05-13 ENCOUNTER — Encounter (HOSPITAL_COMMUNITY): Payer: Medicare Other

## 2012-05-14 NOTE — Progress Notes (Signed)
Case discussed with Dr. Guy Sandifer at the time of the visit.  We reviewed the resident's history and exam and pertinent patient test results.  I agree with the assessment, diagnosis and plan of care documented in the resident's note.

## 2012-05-15 ENCOUNTER — Encounter (HOSPITAL_COMMUNITY): Payer: Self-pay | Admitting: Emergency Medicine

## 2012-05-15 ENCOUNTER — Emergency Department (INDEPENDENT_AMBULATORY_CARE_PROVIDER_SITE_OTHER)
Admission: EM | Admit: 2012-05-15 | Discharge: 2012-05-15 | Disposition: A | Discharge status: Home / Self Care | Payer: Medicare Other | Source: Home / Self Care | Attending: Emergency Medicine | Admitting: Emergency Medicine

## 2012-05-15 DIAGNOSIS — J019 Acute sinusitis, unspecified: Secondary | ICD-10-CM

## 2012-05-15 MED ORDER — AMOXICILLIN 500 MG PO CAPS: 1000.0000 mg | ORAL_CAPSULE | Freq: Three times a day (TID) | ORAL | Status: DC

## 2012-05-15 NOTE — ED Notes (Signed)
Reports: sinus pressure and pain. Yellow mucus with blowing nose. Hx of copd and bronchitis.  Denies sob and chest pain. No relief with otc meds

## 2012-05-15 NOTE — ED Provider Notes (Signed)
History     CSN: [id number]  Arrival date & time 05/15/12  1425   First MD Initiated Contact with Patient 05/15/12 1515      Chief Complaint  Patient presents with  . Facial Pain    sinus pressure and pain. yellow mucus with blowing nose     Patient is a 62 y.o. female presenting with URI.  URI Presenting symptoms: congestion, cough, facial pain, rhinorrhea and sore throat   Presenting symptoms: no ear pain, no fatigue and no fever   Severity:  Moderate Onset quality:  Gradual Duration:  2 weeks Timing:  Constant Progression:  Worsening Chronicity:  Recurrent Associated symptoms: headaches, sinus pain and wheezing   Associated symptoms: no arthralgias, no myalgias, no neck pain, no sneezing and no swollen glands   Risk factors: being elderly, chronic respiratory disease and recent illness   Pt reports she has been having > 2 weeks h/o URI type symptoms. Was admitted to hosp in late March w/ COPD exacerbation. Since her recent d/c she has had increasing URI sxc's such as runny nose, persistent cough and frntal area h/a's. States her cough is chronic but somewhat worse recently. Pt is home 02 dependent. Pt states she knows she has a sinus infection. Already has meds for cough and just needs antibibiotic for sinus infection. Denies fever, CP, SOB or N/V/D.   Past Medical History  Diagnosis Date  . Depression   . GERD (gastroesophageal reflux disease)     Followed by [name] // small bowel capsule endoscopy (11/2009) - minimal chronic inflammation, otherwise negative for  H.pylori, metaplasia, dysplasia  . Hypertension   . Hyperlipidemia   . Diabetes mellitus type II, uncontrolled     on insulin therapy  . COPD (chronic obstructive pulmonary disease) 2008    started home O2 (12/2010) // (+) significant tobacco abuse  . Moderate to severe pulmonary hypertension     severe per TEE (08/2010) - Peak RV-RA gradient 61m Hg  . Mitral regurgitation     mild to moderate per TEE  (08/2010)  . Shingles   . Iron deficiency anemia     Prior BL Hgb 9-11. Source initially unclear with EGD/Colonoscopy (05/2011) not indicative of active bleeding source // DX with RCC in 07/2011, s/p cryoablation - with subsequent normalization of Hgb. (2014)  . History of colonic polyps     Colonoscopy (10/2005) - colon polyp with path nonadenomatous, no malignancy - by Dr. [name] // Colonoscopy (02/2002) - 2 polyps noted, 1 - adenomatous, 2 - hyperplastic // Followed by Dr. [name]// Repeat Colonoscopy (05/2011) - single tubular adenoma polyp - performed by Dr. [name] . Anxiety   . IBS (irritable bowel syndrome)   . Osteoporosis     DEXA (12/2004) - L-spine T score -2.6, Left hip -0.1  . Fibromyalgia   . Obstructive sleep apnea     on CPAP // Sleep study (06/2009) - Moderate sleep apnea/ hypopnea syndrome , AHI 17.8 per hour with nonpositional hypopneas. CPAP titration to 12 CWP, AHI 2.4 per hour. Small resMed Quattro full-face mask with heated humidifier  . Restless leg syndrome   . Mitral stenosis     moderate per TEE (08/2010) // Mitral valve replacement with a 27-mm pericardial porcine valve (Medtronic Mosaic valve, serial ##[id number] on 09/20/2010 - PIvin Poot// [facility] [name] . Bilateral carotid artery stenosis     s/p right endarterectomy (06/2010). // Carotid UKorea(07/2010) -  60%-79% ICA  stenosis, mid range of scale by velocity.  Left: Moderate-to-severe calcific and non-calcific plaque origin andproximal ICA and ECA.  60%-79% ICA stenosis, highest end of scale // Followed by [name].  . Peripheral vascular disease   . Internal hemorrhoids     small internal hemorrhoids noted on colonoscopy 05/2011  . Clear cell renal cell carcinoma 07/21/2011    S/P cryoablation of left RCC in 09/2011 by [name] // Historically - MRI Abdomen (07/2011) - 2.7 x 2.7 x 3.0 cm enhancing solid renal mass posterior aspect of the lower pole of the left kidney.  // CT Abdomen in 09/2010 showed no evidence of mass // Followed by [name]  [facility])   . Diastolic CHF     last echo (12/2010) showing grade 2 diastolic dysfunction. Otherwise, normal EF of 55-65%  . H/O mitral valve replacement 09/30/2011  . A-fib 10/22/2010    S/P Left atrial maze procedure for paroxysmal atrial fibrillation. 09/20/2010 Dr Prescott Gum     Past Surgical History  Procedure Laterality Date  . Orif clavicle fracture  01/2004    by Thana Farr. [name], M.D for Right clavicle nonunion.  . Tonsillectomy    . Carotid endarterectomy  07/04/2010    right, by [name] for asymptomatic right carotid artery stenosis  . Lipoma exision  08/2005    occipital lipoma 1.5cm - by Dr. [name]  . Hysteroscopy with endometrial ablation  06/2001    for persistent post-menopausal bleeding // by S. [name], M.D.  . Mitral valve replacement  09/20/10     with a 27-mm pericardial porcine valve (Medtronic Mosaic valve, serial #[id number]). 09/20/10, Dr Prescott Gum  . Left atrial maze procedure  09/20/10    for paroxysmal atrial fibrillation (Dr. Prescott Gum)  . Placement of left chest tube.  09/24/2010    Dr Prescott Gum  . Pr vein bypass graft,aorto-fem-pop  May 2012  . Cardiac valve replacement  Aug. 2012  . Colonoscopy  05/12/2011    performed by Dr. Michail Sermon. Showing small internal hemorrhoids, single tubular adenoma polyp  . Esophagogastroduodenoscopy  05/12/2011    performed by Dr. Michail Sermon. Negative for ulcerations, biopsy negative for evidence of celiac sprue  . Tubal ligation      Family History  Problem Relation Age of Onset  . Heart disease Father 24    died to MI at 39yo  . Heart attack Father   . Heart disease Brother 92    died of MI at 66yo  . Heart attack Brother     History  Substance Use Topics  . Smoking status: Current Every Day Smoker -- 1.00 packs/day for 50 years    Types: Cigarettes    Last Attempt to Quit: 07/12/2010  . Smokeless tobacco: Never Used      Comment: quit 02/01/2012  . Alcohol Use: No    OB History   Grav Para Term Preterm Abortions TAB SAB Ect Mult Living                  Review of Systems  Constitutional: Negative.  Negative for fever, chills and fatigue.  HENT: Positive for congestion, sore throat, rhinorrhea and sinus pressure. Negative for ear pain, sneezing and neck pain.   Eyes: Negative.   Respiratory: Positive for cough and wheezing. Negative for chest tightness and shortness of breath.   Cardiovascular: Negative for chest pain.  Gastrointestinal: Negative.   Endocrine: Negative.   Genitourinary: Negative.   Musculoskeletal: Negative for myalgias and  arthralgias.  Allergic/Immunologic: Negative.   Neurological: Positive for headaches.  Hematological: Negative.   Psychiatric/Behavioral: Negative.     Allergies  Lorazepam; Morphine; Oxycontin; and Tramadol hcl  Home Medications   Current Outpatient Rx  Name  Route  Sig  Dispense  Refill  . ACCU-CHEK FASTCLIX LANCETS MISC   Does not apply   1 each by Does not apply route 3 (three) times daily before meals. Check blood sugars 3 times daily before breakfast, lunch, and dinner DX code: 250.02   102 each   12   . albuterol (PROVENTIL HFA;VENTOLIN HFA) 108 (90 BASE) MCG/ACT inhaler   Inhalation   Inhale 2 puffs into the lungs every 6 (six) hours as needed. For wheeze or shortness of breath   3 Inhaler   3   . alendronate (FOSAMAX) 70 MG tablet   Oral   Take 70 mg by mouth every 7 (seven) days. Takes on wednesdays         . ALPRAZolam (XANAX) 1 MG tablet      0.5 mg by mouth up to twice daily as needed for anxiety. AND 57m at bedtime for sleep   6 tablet   0   . aspirin EC 81 MG tablet   Oral   Take 81 mg by mouth every evening.         .Marland KitchenbuPROPion (WELLBUTRIN XL) 150 MG 24 hr tablet   Oral   Take 1 tablet (150 mg total) by mouth daily with supper.   90 tablet   3   . calcium carbonate (OS-CAL) 600 MG TABS   Oral   Take 1,200 mg by  mouth daily.         . chlorpheniramine-HYDROcodone (TUSSIONEX PENNKINETIC ER) 10-8 MG/5ML LQCR   Oral   Take 5 mLs by mouth every 12 (twelve) hours as needed.   115 mL   0   . docusate sodium (COLACE) 50 MG capsule   Oral   Take 100 mg by mouth every morning. Can Alternate with  senokot         . DULoxetine (CYMBALTA) 30 MG capsule   Oral   Take 90 mg by mouth daily with breakfast.          . Elastic Bandages & Supports (TRUFORM STOCKINGS 20-30MMHG) MISC      Wear compression stockings throughout the day, and take off at nighttime.   2 each   1   . Fluticasone-Salmeterol (ADVAIR DISKUS) 250-50 MCG/DOSE AEPB   Inhalation   Inhale 1 puff into the lungs every 12 (twelve) hours.   3 each   3   . furosemide (LASIX) 80 MG tablet   Oral   Take 80 mg by mouth 2 (two) times daily.         .Marland Kitchengabapentin (NEURONTIN) 300 MG capsule   Oral   Take 300 mg by mouth 2 (two) times daily.         .Marland Kitchenglucose blood (BAYER CONTOUR TEST) test strip      Use to test blood sugar 3 times daily.Dx code 250.00   100 each   12   . HYDROcodone-acetaminophen (NORCO/VICODIN) 5-325 MG per tablet   Oral   Take 1-2 tablets by mouth every 6 (six) hours as needed for pain.         .Marland Kitcheninsulin aspart (NOVOLOG FLEXPEN) 100 UNIT/ML injection      Takes 14 units with meals and also uses SSI with meals in addition to  the 14 units of insulin   1 vial      . insulin detemir (LEVEMIR) 100 UNIT/ML injection   Subcutaneous   Inject 0.62 mLs (62 Units total) into the skin at bedtime.   10 mL      . Insulin Pen Needle (B-D UF III MINI PEN NEEDLES) 31G X 5 MM MISC      DX: 250.02 Please check blood sugars 3 times daily.   100 each   11   . Insulin Syringe-Needle U-100 (INSULIN SYRINGE .3CC/31GX5/16") 31G X 5/16" 0.3 ML MISC   Does not apply   1 Syringe by Does not apply route 2 (two) times daily at 10 AM and 5 PM.   100 each   11   . lisinopril (PRINIVIL,ZESTRIL) 10 MG tablet   Oral    Take 1 tablet (10 mg total) by mouth daily.   30 tablet   3   . loratadine (CLARITIN) 10 MG tablet   Oral   Take 10 mg by mouth daily with supper.         . nystatin (MYCOSTATIN/NYSTOP) 100000 UNIT/GM POWD   Topical   Apply 1 g topically daily as needed (; apply to stomach and under vreasts).         Marland Kitchen omeprazole (PRILOSEC) 40 MG capsule   Oral   Take 1 capsule (40 mg total) by mouth 2 (two) times daily.   60 capsule   5   . potassium chloride SA (K-DUR,KLOR-CON) 20 MEQ tablet   Oral   Take 20-40 mEq by mouth 2 (two) times daily. 2 tablets in the  Morning and 1 tablet at night         . rosuvastatin (CRESTOR) 20 MG tablet   Oral   Take 1 tablet (20 mg total) by mouth daily with supper.   30 tablet   5   . sennosides-docusate sodium (SENOKOT-S) 8.6-50 MG tablet   Oral   Take 2 tablets by mouth every other day as needed for constipation. Hold for loose stools         . Spacer/Aero-Holding Chambers (AEROCHAMBER MV) inhaler      Use as instructed with inhalers.   1 each   0   . tiotropium (SPIRIVA) 18 MCG inhalation capsule   Inhalation   Place 1 capsule (18 mcg total) into inhaler and inhale daily with breakfast.   30 capsule   11   . EXPIRED: ferrous gluconate (FERGON) 324 MG tablet   Oral   Take 1 tablet (324 mg total) by mouth 2 (two) times daily with a meal.   120 tablet   3     BP 119/79  Pulse 69  Temp(Src) 98.5 F (36.9 C) (Oral)  Resp 18  SpO2 97%  Physical Exam  Constitutional: She appears well-developed and well-nourished.  HENT:  Head: Normocephalic and atraumatic.  Right Ear: Tympanic membrane, external ear and ear canal normal.  Left Ear: Tympanic membrane, external ear and ear canal normal.  Nose: Mucosal edema and rhinorrhea present. Right sinus exhibits maxillary sinus tenderness and frontal sinus tenderness. Left sinus exhibits maxillary sinus tenderness and frontal sinus tenderness.  Mouth/Throat: Uvula is midline.  Turbinates  swollen and erythematous  Mild pharyngeal erythema    ED Course  Procedures (including critical care time)  Labs Reviewed - No data to display No results found.   No diagnosis found.    MDM  Hx and PE c/w acute sinusitis. Will treat w/ Amoxicillin x  10 days and encourage close f/u w/ PCP. Pt agreeable.        Jeryl Columbia, NP 05/15/12 1755

## 2012-05-15 NOTE — ED Provider Notes (Signed)
Medical screening examination/treatment/procedure(s) were performed by non-physician practitioner and as supervising physician I was immediately available for consultation/collaboration.  Philipp Deputy, M.D.   Harden Mo, MD 05/15/12 [phone]

## 2012-05-15 NOTE — Discharge Instructions (Signed)
Take antibiotic as directed and arrange follow u[p with Dr Benard Rink as planned. Return to ED or here if symptoms worsen.  Sinusitis Sinusitis an infection of the air pockets (sinuses) in your face. This can cause puffiness (swelling). It can also cause drainage from your sinuses.  HOME CARE   Only take medicine as told by your doctor.  Drink enough fluids to keep your pee (urine) clear or pale yellow.  Apply moist heat or ice packs for pain relief.  Use salt (saline) nose sprays. The spray will wet the thick fluid in the nose. This can help the sinuses drain. GET HELP RIGHT AWAY IF:   You have a fever.  Your baby is older than 3 months with a rectal temperature of 102 F (38.9 C) or higher.  Your baby is 24 months old or younger with a rectal temperature of 100.4 F (38 C) or higher.  The pain gets worse.  You get a very bad headache.  You keep throwing up (vomiting).  Your face gets puffy. MAKE SURE YOU:   Understand these instructions.  Will watch your condition.  Will get help right away if you are not doing well or get worse. Document Released: 07/16/2007 Document Revised: 04/21/2011 Document Reviewed: 07/16/2007 [facility] Patient Information 2013 [place].

## 2012-05-18 ENCOUNTER — Encounter (HOSPITAL_COMMUNITY): Payer: Medicare Other

## 2012-05-20 ENCOUNTER — Encounter (HOSPITAL_COMMUNITY): Payer: Medicare Other | Attending: Internal Medicine

## 2012-05-20 DIAGNOSIS — I739 Peripheral vascular disease, unspecified: Secondary | ICD-10-CM | POA: Insufficient documentation

## 2012-05-20 DIAGNOSIS — I5032 Chronic diastolic (congestive) heart failure: Secondary | ICD-10-CM | POA: Insufficient documentation

## 2012-05-20 DIAGNOSIS — I2510 Atherosclerotic heart disease of native coronary artery without angina pectoris: Secondary | ICD-10-CM | POA: Insufficient documentation

## 2012-05-20 DIAGNOSIS — I509 Heart failure, unspecified: Secondary | ICD-10-CM | POA: Insufficient documentation

## 2012-05-20 DIAGNOSIS — I10 Essential (primary) hypertension: Secondary | ICD-10-CM | POA: Insufficient documentation

## 2012-05-20 DIAGNOSIS — Z951 Presence of aortocoronary bypass graft: Secondary | ICD-10-CM | POA: Insufficient documentation

## 2012-05-20 DIAGNOSIS — I4891 Unspecified atrial fibrillation: Secondary | ICD-10-CM | POA: Insufficient documentation

## 2012-05-20 DIAGNOSIS — Z5189 Encounter for other specified aftercare: Secondary | ICD-10-CM | POA: Insufficient documentation

## 2012-05-20 DIAGNOSIS — Z952 Presence of prosthetic heart valve: Secondary | ICD-10-CM | POA: Insufficient documentation

## 2012-05-25 ENCOUNTER — Encounter (HOSPITAL_COMMUNITY): Payer: Medicare Other

## 2012-05-26 ENCOUNTER — Other Ambulatory Visit: Payer: Self-pay | Admitting: *Deleted

## 2012-05-26 MED ORDER — HYDROCODONE-ACETAMINOPHEN 5-325 MG PO TABS: 1.0000 | ORAL_TABLET | Freq: Four times a day (QID) | ORAL | Status: DC | PRN

## 2012-05-26 NOTE — Telephone Encounter (Signed)
Rx faxed in, instructions included

## 2012-05-26 NOTE — Telephone Encounter (Signed)
Last filled 3/24 PPA is mail order

## 2012-05-27 ENCOUNTER — Encounter (HOSPITAL_COMMUNITY): Payer: Medicare Other

## 2012-05-31 ENCOUNTER — Other Ambulatory Visit: Payer: Self-pay | Admitting: *Deleted

## 2012-05-31 DIAGNOSIS — F419 Anxiety disorder, unspecified: Secondary | ICD-10-CM

## 2012-05-31 MED ORDER — ALPRAZOLAM 1 MG PO TABS: ORAL_TABLET | ORAL | Status: DC

## 2012-05-31 NOTE — Telephone Encounter (Signed)
Rx called in to pharmacy. 

## 2012-05-31 NOTE — Telephone Encounter (Signed)
Please call in her xanax, ONLY #[phone], that is how it is prescribed. I will NOT fill #90. Thank you! - [name], D.O., 05/31/2012, 3:37 PM

## 2012-06-01 ENCOUNTER — Encounter (HOSPITAL_COMMUNITY): Payer: Medicare Other

## 2012-06-01 ENCOUNTER — Encounter: Payer: Self-pay | Admitting: Internal Medicine

## 2012-06-01 ENCOUNTER — Ambulatory Visit (INDEPENDENT_AMBULATORY_CARE_PROVIDER_SITE_OTHER): Payer: Medicare Other | Admitting: Internal Medicine

## 2012-06-01 VITALS — BP 118/60 | HR 93 | Temp 97.1°F | Wt 187.8 lb

## 2012-06-01 DIAGNOSIS — I10 Essential (primary) hypertension: Secondary | ICD-10-CM

## 2012-06-01 DIAGNOSIS — E1165 Type 2 diabetes mellitus with hyperglycemia: Secondary | ICD-10-CM

## 2012-06-01 DIAGNOSIS — J4489 Other specified chronic obstructive pulmonary disease: Secondary | ICD-10-CM

## 2012-06-01 DIAGNOSIS — J309 Allergic rhinitis, unspecified: Secondary | ICD-10-CM

## 2012-06-01 DIAGNOSIS — IMO0002 Reserved for concepts with insufficient information to code with codable children: Secondary | ICD-10-CM

## 2012-06-01 DIAGNOSIS — J449 Chronic obstructive pulmonary disease, unspecified: Secondary | ICD-10-CM

## 2012-06-01 DIAGNOSIS — E119 Type 2 diabetes mellitus without complications: Secondary | ICD-10-CM

## 2012-06-01 LAB — GLUCOSE, CAPILLARY: Glucose-Capillary: 196 mg/dL — ABNORMAL HIGH (ref 70–99)

## 2012-06-01 MED ORDER — INSULIN DETEMIR 100 UNIT/ML ~~LOC~~ SOLN: 65.0000 [IU] | Freq: Every day | SUBCUTANEOUS | Status: DC

## 2012-06-01 MED ORDER — FLUTICASONE PROPIONATE 50 MCG/ACT NA SUSP: 2.0000 | Freq: Every day | NASAL | Status: DC

## 2012-06-01 MED ORDER — CETIRIZINE HCL 10 MG PO TABS: 10.0000 mg | ORAL_TABLET | Freq: Every day | ORAL | Status: DC

## 2012-06-01 NOTE — Assessment & Plan Note (Signed)
Pertinent Data: BP Readings from Last 3 Encounters:  06/01/12 118/60  05/15/12 119/79  05/11/12 415/83    Basic Metabolic Panel:    Component Value Date/Time   NA 137 05/11/2012 0915   K 4.7 05/11/2012 0915   CL 97 05/11/2012 0915   CO2 31 05/11/2012 0915   BUN 30* 05/11/2012 0915   CREATININE 1.32* 05/11/2012 0915   CREATININE 1.16* 05/07/2012 0744   GLUCOSE 179* 05/11/2012 0915   CALCIUM 9.0 05/11/2012 0915    Assessment: Disease Control: controlled  Progress toward goals: at goal  Barriers to meeting goals: no barriers identified    Has started smoking again.    Patient is compliant most of the time with prescribed medications.   Plan:  continue current medications  Smoking cessation is needed.  Educational resources provided: handout  Self management tools provided:

## 2012-06-01 NOTE — Assessment & Plan Note (Addendum)
Pertinent Labs: Lab Results  Component Value Date   HGBA1C 8.4* 05/04/2012   HGBA1C 8.0 02/02/2012   CREATININE 1.32* 05/11/2012   CREATININE 1.16* 05/07/2012   MICROALBUR 0.59 05/29/2011   MICRALBCREAT 13.4 05/29/2011   CHOL 130 10/23/2011   HDL 33* 10/23/2011   TRIG 317* 10/23/2011    Assessment: Disease Control: not controlled  Progress toward goals: unchanged  Barriers to meeting goals: no barriers identified  Microvascular complications: peripheral neuropathy  Macrovascular complications: cerebrovascular disease and peripheral vascular disease  On aspirin: Yes  On statin: Yes  On ACE-I/ARB: Yes    Patient now taking 14 units TIDWC + TID SSI + Levemir 62 units - is still having fasting hyperglycemia with CBG in 180-220s and staying around the same prior to each meal (with some outliers) This suggests to be that she needs additional basal coverage, because her meal coverage is adequately bringing BG back to premeal values.   She is currently on levemir once daily - if having persistent hyperglycemia with escalating doses of Levemir --> may require transition to BID dosing.   Today, I will first adjust the basal insulin, monitor, then adjust meal time insulin in future vs splitting her Levemir.  Patient is compliant most of the time with prescribed medications.   Plan: Glucometer log was reviewed today, as pt did have glucometer available for review.   Escalate Levemir to 65 units qday  Continue meal coverage at 14 units TIDWC + SSI.  During next visit, may consider to further escalate her meal coverage to 16 units if still requiring between 16-24 units TID with meals OR splitting her Levemir to BID dosing if effects are not lasting all day.  reminded to bring blood glucose meter & log to each visit

## 2012-06-01 NOTE — Patient Instructions (Signed)
General Instructions:  Please follow-up at the clinic in 1 month, at which time we will reevaluate your diabetes and sinuses - OR, please follow-up in the clinic sooner if needed.  There have been changes in your medications:  INCREASE your levemir to 65 units   STOP claritin  START zyrtec for your allergies  START flonase for your nasal congestion.    If you have been started on new medication(s), and you develop symptoms concerning for allergic reaction, including, but not limited to, throat closing, tongue swelling, rash, please stop the medication immediately and call the clinic at [phone], and go to the ER.  If you are diabetic, please bring your meter to your next visit.  If symptoms worsen, or new symptoms arise, please call the clinic or go to the ER.  PLEASE BRING ALL OF YOUR MEDICATIONS  IN A BAG TO YOUR NEXT APPOINTMENT   Treatment Goals:  Goals (1 Years of Data) as of 06/01/12         As of Today 05/15/12 05/11/12 05/11/12 05/07/12     Blood Pressure    . Blood Pressure < 140/90  118/60 119/79 119/63 106/63 137/74     Result Component    . HEMOGLOBIN A1C < 7.0          . LDL CALC < 70            Progress Toward Treatment Goals:  Treatment Goal 06/01/2012  Hemoglobin A1C at goal  Blood pressure at goal  Stop smoking smoking less    Self Care Goals & Plans:  Self Care Goal 05/11/2012  Manage my medications take my medicines as prescribed; bring my medications to every visit; refill my medications on time  Monitor my health keep track of my blood glucose; bring my glucose meter and log to each visit  Eat healthy foods drink diet soda or water instead of juice or soda; eat more vegetables; eat baked foods instead of fried foods  Be physically active find an activity I enjoy    Home Blood Glucose Monitoring 06/01/2012  Check my blood sugar 3 times a day  When to check my blood sugar before meals

## 2012-06-01 NOTE — Assessment & Plan Note (Signed)
Assessment: Symptoms most consist with allergic rhinitis, postnasal drip and associated cough. No fevers or purulent sputum, therefore, I do not think she is having acute sinusitis. She has already been treated with a 10 day course of Amoxicillin, which she completed.  Plan:      Will start flonase.  Will change Claritin to Zyrtec. The patient was informed of the red flag symptoms that should prompt her immediate return for reevaluation - such as, but not limited to the following: worsening fevers, chills, increased facial pain, nasal discharge. She verbally expresses understanding of the information provided.

## 2012-06-01 NOTE — Assessment & Plan Note (Signed)
Assessment: The patient has O2 dependent COPD (wears O2 with ambulation) in the setting of former very heavy tobacco abuse, and recent light tobacco abuse. She is already enrolled in pulmonary rehab (brief hiatus after recent COPD exac), Advair, Spiriva, PRN albuterol. She has not established regular care with a pulmonologist, which has been offered to her in the past, but refused. She agrees that given severity of her disease, proceeding with regular followup with a pulmonologist would be appropriate.  Plan:      Continue home meds.  Smoking cessation encouraged.  Referral to pulmonology for continued care.

## 2012-06-01 NOTE — Progress Notes (Addendum)
Patient: [name]   MRN: [id number]  DOB: [birth year]  PCP: Annamarie Dawley, DO   Subjective:    CC: Sinus Problem and Diabetes   HPI: [name] is a 62 y.o. female with a PMHx as outlined below, who presented to clinic today for the following:  1) DM2, uncontrolled - Goal 8 Lab Results  Component Value Date   HGBA1C 8.4* 05/04/2012   Patient checking blood sugars 2-3 times daily, before breakfast, lunch, and dinner. Reports fasting blood sugars of < 200 mg/dL - 170s mg/dL. Currently taking Levemir 62 units at bedtime, 14 units with meals + sliding scale insulin of 1:50 over 150 mg/dL Patient misses doses 0 x per week on average.  0 hypoglycemic episodes since last visit, last symptomatic hypoglycemia several years ago. admits to polyuria, polydipsia because is on lasix. Denies nausea, vomiting, diarrhea.   In regards to diabetic complications:  Microvascular complications: Confirms: peripheral neuropathy ; Denies nephropathy and retinopathy.  Macrovascular complications: Confirms: cerebrovascular disease and peripheral vascular disease ; Denies cardiovascular disease.   Important diabetic medications: Is patient on aspirin? Yes Is patient on statin? Yes Is patient on ARB? Yes  2) HTN - Patient does not check blood pressure regularly at home. Currently taking Lisinopril 47m. Patient misses doses 0 x per week on average. denies headaches, dizziness, lightheadedness, chest pain, shortness of breath.  does not request refills today.    3) Sinus pain - patient was seen in urgent care on 4/5 for acute sinusitis where she initially presented with runny nose, facial pain. She was prescribed and completed a 10 day course of amoxicillin. Over the last 2-3 days, has started feeling facial fullness again. However, her cough, nasal congestion and overall symptoms have been improving. No fevers, chills.    Review of Systems: Per HPI.   Current Outpatient  Medications: Medication Sig  . ACCU-CHEK FASTCLIX LANCETS MISC 1 each by Does not apply route 3 (three) times daily before meals. Check blood sugars 3 times daily before breakfast, lunch, and dinner DX code: 250.02  . albuterol (PROVENTIL HFA;VENTOLIN HFA) 108 (90 BASE) MCG/ACT inhaler Inhale 2 puffs into the lungs every 6 (six) hours as needed. For wheeze or shortness of breath  . alendronate (FOSAMAX) 70 MG tablet Take 70 mg by mouth every 7 (seven) days. Takes on wednesdays  . ALPRAZolam (XANAX) 1 MG tablet 0.5 mg by mouth up to twice daily as needed for anxiety. AND 155mat bedtime for sleep  . aspirin EC 81 MG tablet Take 81 mg by mouth every evening.  . Marland KitchenuPROPion (WELLBUTRIN XL) 150 MG 24 hr tablet Take 1 tablet (150 mg total) by mouth daily with supper.  . calcium carbonate (OS-CAL) 600 MG TABS Take 1,200 mg by mouth daily.  . Marland Kitchenocusate sodium (COLACE) 50 MG capsule Take 100 mg by mouth every morning. Can Alternate with  senokot  . DULoxetine (CYMBALTA) 30 MG capsule Take 90 mg by mouth daily with breakfast.   . Elastic Bandages & Supports (TRUFORM STOCKINGS 20-30MMHG) MISC Wear compression stockings throughout the day, and take off at nighttime.  . ferrous gluconate (FERGON) 324 MG tablet Take 1 tablet (324 mg total) by mouth 2 (two) times daily with a meal.  . Fluticasone-Salmeterol (ADVAIR DISKUS) 250-50 MCG/DOSE AEPB Inhale 1 puff into the lungs every 12 (twelve) hours.  . furosemide (LASIX) 80 MG tablet Take 80 mg by mouth 2 (two) times daily.  . Marland Kitchenabapentin (NEURONTIN) 300 MG capsule  Take 300 mg by mouth 2 (two) times daily.  Marland Kitchen glucose blood (BAYER CONTOUR TEST) test strip Use to test blood sugar 3 times daily.Dx code 250.00  . HYDROcodone-acetaminophen (NORCO/VICODIN) 5-325 MG per tablet Take 1-2 tablets by mouth every 6 (six) hours as needed for pain.  Marland Kitchen insulin aspart (NOVOLOG FLEXPEN) 100 UNIT/ML injection Takes 14 units with meals and also uses SSI with meals in addition to the 14  units of insulin  . insulin detemir (LEVEMIR) 100 UNIT/ML injection Inject 0.62 mLs (62 Units total) into the skin at bedtime.  . Insulin Pen Needle (B-D UF III MINI PEN NEEDLES) 31G X 5 MM MISC DX: 250.02 Please check blood sugars 3 times daily.  . Insulin Syringe-Needle U-100 (INSULIN SYRINGE .3CC/31GX5/16") 31G X 5/16" 0.3 ML MISC 1 Syringe by Does not apply route 2 (two) times daily at 10 AM and 5 PM.  . lisinopril (PRINIVIL,ZESTRIL) 10 MG tablet Take 1 tablet (10 mg total) by mouth daily.  Marland Kitchen loratadine (CLARITIN) 10 MG tablet Take 10 mg by mouth daily with supper.  . nystatin (MYCOSTATIN/NYSTOP) 100000 UNIT/GM POWD Apply 1 g topically daily as needed (; apply to stomach and under vreasts).  Marland Kitchen omeprazole (PRILOSEC) 40 MG capsule Take 1 capsule (40 mg total) by mouth 2 (two) times daily.  . potassium chloride SA (K-DUR,KLOR-CON) 20 MEQ tablet Take by mouth 2 (two) times daily. 2 tablets in the  Morning and 1 tablet at night  . rosuvastatin (CRESTOR) 20 MG tablet Take 1 tablet (20 mg total) by mouth daily with supper.  Marland Kitchen Spacer/Aero-Holding Chambers (AEROCHAMBER MV) inhaler Use as instructed with inhalers.  . tiotropium (SPIRIVA) 18 MCG inhalation capsule Place 1 capsule (18 mcg total) into inhaler and inhale daily with breakfast.   No current facility-administered medications for this visit.    Allergies: Allergies  Allergen Reactions  . Lorazepam     Patient's sister noted that ativan caused the patient to become extremely confused during hospitalization 09/2010  . Morphine     REACTION: Unknown reaction  . Oxycontin (Oxycodone) Other (See Comments)    headache  . Tramadol Hcl     REACTION: swelling    Past Medical History  Diagnosis Date  . Depression   . GERD (gastroesophageal reflux disease)     Followed by [name] // small bowel capsule endoscopy (11/2009) - minimal chronic inflammation, otherwise negative for  H.pylori, metaplasia, dysplasia  . Hypertension   .  Hyperlipidemia   . Diabetes mellitus type II, uncontrolled     on insulin therapy  . COPD (chronic obstructive pulmonary disease) 2008    started home O2 (12/2010) // (+) significant tobacco abuse  . Moderate to severe pulmonary hypertension     severe per TEE (08/2010) - Peak RV-RA gradient 61m Hg  . Mitral regurgitation     mild to moderate per TEE (08/2010)  . Shingles   . Iron deficiency anemia     Prior BL Hgb 9-11. Source initially unclear with EGD/Colonoscopy (05/2011) not indicative of active bleeding source // DX with RCC in 07/2011, s/p cryoablation - with subsequent normalization of Hgb. (2014)  . History of colonic polyps     Colonoscopy (10/2005) - colon polyp with path nonadenomatous, no malignancy - by Dr. [name] // Colonoscopy (02/2002) - 2 polyps noted, 1 - adenomatous, 2 - hyperplastic // Followed by Dr. [name]// Repeat Colonoscopy (05/2011) - single tubular adenoma polyp - performed by Dr. [name] . Anxiety   .  IBS (irritable bowel syndrome)   . Osteoporosis     DEXA (12/2004) - L-spine T score -2.6, Left hip -0.1  . Fibromyalgia   . Obstructive sleep apnea     on CPAP // Sleep study (06/2009) - Moderate sleep apnea/ hypopnea syndrome , AHI 17.8 per hour with nonpositional hypopneas. CPAP titration to 12 CWP, AHI 2.4 per hour. Small resMed Quattro full-face mask with heated humidifier  . Restless leg syndrome   . Mitral stenosis     moderate per TEE (08/2010) // Mitral valve replacement with a 27-mm pericardial porcine valve (Medtronic Mosaic valve, serial #[id number]) on 09/20/2010 - [name] // [facility], Dr. Stanford Breed  . Bilateral carotid artery stenosis     s/p right endarterectomy (06/2010). // Carotid US (07/2010) -  60%-79% ICA stenosis, mid range of scale by velocity.  Left: Moderate-to-severe calcific and non-calcific plaque origin andproximal ICA and ECA.  60%-79% ICA stenosis, highest end of scale // Followed by [name].  .  Peripheral vascular disease   . Internal hemorrhoids     small internal hemorrhoids noted on colonoscopy 05/2011  . Clear cell renal cell carcinoma 07/21/2011    S/P cryoablation of left RCC in 09/2011 by [name] // Historically - MRI Abdomen (07/2011) - 2.7 x 2.7 x 3.0 cm enhancing solid renal mass posterior aspect of the lower pole of the left kidney. // CT Abdomen in 09/2010 showed no evidence of mass // Followed by [name]  [facility])   . Diastolic CHF     last echo (12/2010) showing grade 2 diastolic dysfunction. Otherwise, normal EF of 55-65%  . H/O mitral valve replacement 09/30/2011  . A-fib 10/22/2010    S/P Left atrial maze procedure for paroxysmal atrial fibrillation. 09/20/2010 Dr Lucianne Lei Kerby Less      Objective:    Physical Exam: Filed Vitals:   06/01/12 0913  BP: 118/60  Pulse: 93  Temp: 97.1 F (36.2 C)     General: Vital signs reviewed and noted. Well-developed, well-nourished, in no acute distress; alert, appropriate and cooperative throughout examination.  Head: Normocephalic, atraumatic.  Eyes: conjunctivae/corneas clear. PERRL, EOM's intact.   Ears: TM nonerythematous, not bulging, good light reflex bilaterally.  Nose: Mucous membranes moist, inflammed, mildly erythematous. (+) light yellow nasal drainage.  Throat: Oropharynx nonerythematous, with no tonsillar enlargement, erythema or exudates.  Neck: No deformities, masses, or tenderness noted.  Lungs:  Normal respiratory effort and CTA BL.  Heart: RRR. S1 and S2 normal without gallop, rubs. (+) murmur.  Abdomen:  BS normoactive. Soft, Nondistended, non-tender.  No masses or organomegaly.  Extremities: No pretibial edema.     Assessment/ Plan:   The patient's case and plan of care was discussed with attending physician, Dr. Janell Quiet.

## 2012-06-02 ENCOUNTER — Ambulatory Visit (INDEPENDENT_AMBULATORY_CARE_PROVIDER_SITE_OTHER): Payer: Medicare Other | Admitting: Pulmonary Disease

## 2012-06-02 ENCOUNTER — Encounter: Payer: Self-pay | Admitting: Pulmonary Disease

## 2012-06-02 VITALS — BP 122/58 | HR 83 | Temp 97.7°F | Ht 62.0 in | Wt 189.0 lb

## 2012-06-02 DIAGNOSIS — J4489 Other specified chronic obstructive pulmonary disease: Secondary | ICD-10-CM

## 2012-06-02 DIAGNOSIS — J449 Chronic obstructive pulmonary disease, unspecified: Secondary | ICD-10-CM

## 2012-06-02 NOTE — Assessment & Plan Note (Signed)
The patient carries a diagnosis of significant COPD, however I do not have any pulmonary function studies to verify at this time.  She did have spirometry in 2006 which showed minimal airflow obstruction.  Assuming she does have significant COPD, she is on a very good bronchodilator regimen, and will do as well as can be expected provided she stays off cigarettes.  I have had a long discussion with her about COPD, the management, the importance of oxygen, and finally the importance of pulmonary rehabilitation.  She was in the process of going to pulmonary rehabilitation when she had her recent acute exacerbation, and we'll make sure that she gets referred back.  I also think a lot of her shortness of breath is coming from her pulmonary hypertension related to chronic mitral valve disease (typically is irreversible), as well as her obesity and deconditioning.

## 2012-06-02 NOTE — Patient Instructions (Addendum)
Stay on current breathing medications, including oxygen. Will refer you to pulmonary rehab. Work on weight loss and conditioning. Stay off cigarettes.  This is your MOST important treatment. followup with me in 5mo, but call if you have worsening in your breathing.

## 2012-06-02 NOTE — Progress Notes (Signed)
  Subjective:    Patient ID: [name], female    DOB: [birth year], 62 y.o.   MRN: [id number]  HPI The patient is a 62 year old female who I have been asked to see for management of COPD.  The patient has had breathing issues for years, but has also had a history of chronic mitral valve disease with pulmonary hypertension.  She is status post valve replacement in 2012.  The patient also has had a long-standing history of smoking, and has been given the diagnosis of "COPD".  There is spirometry from 2006 that shows minimal airflow obstruction, but she apparently has had pulmonary function studies in January of 2014.  The patient feels that her breathing is getting worse, and she was actually hospitalized for a COPD exacerbation in March of this year.  She has a long-standing history of smoking, but quit 5 weeks ago.  The patient describes a less than one block dyspnea on exertion, and will get winded just walking through her house.  Her chronic cough is much improved since being off cigarettes.  She does have intermittent lower extremity edema, and her weight has only increased about 5 pounds over the last one year.   Review of Systems  Constitutional: Negative for fever and unexpected weight change.  HENT: Negative for ear pain, nosebleeds, congestion, sore throat, rhinorrhea, sneezing, trouble swallowing, dental problem, postnasal drip and sinus pressure.   Eyes: Negative for redness and itching.  Respiratory: Positive for shortness of breath. Negative for cough, chest tightness and wheezing.   Cardiovascular: Positive for chest pain. Negative for palpitations and leg swelling.  Gastrointestinal: Negative for nausea and vomiting.  Genitourinary: Negative for dysuria.  Musculoskeletal: Negative for joint swelling.  Skin: Negative for rash.  Neurological: Positive for headaches.  Hematological: Does not bruise/bleed easily.  Psychiatric/Behavioral: Positive for dysphoric mood. The patient is  nervous/anxious.        Objective:   Physical Exam Constitutional:  Obese female, no acute distress  HENT:  Nares patent without discharge  Oropharynx without exudate, palate and uvula are normal  Eyes:  Perrla, eomi, no scleral icterus  Neck:  No JVD, no TMG  Cardiovascular:  Normal rate, regular rhythm, no rubs or gallops.  No murmurs        Intact distal pulses but decreased  Pulmonary :  Mildly decreased breath sounds, no stridor or respiratory distress   No rales, rhonchi, or wheezing  Abdominal:  Soft, nondistended, bowel sounds present.  No tenderness noted.   Musculoskeletal:  1+ lower extremity edema noted.  Lymph Nodes:  No cervical lymphadenopathy noted  Skin:  No cyanosis noted  Neurologic:  Alert, appropriate, moves all 4 extremities without obvious deficit.         Assessment & Plan:

## 2012-06-03 ENCOUNTER — Encounter (HOSPITAL_COMMUNITY): Payer: Medicare Other

## 2012-06-04 NOTE — Progress Notes (Signed)
Case discussed with Dr. Guy Sandifer at the time of the visit, immediately after the resident saw the patient.  I reviewed the resident's history and exam and pertinent patient test results.  I agree with the assessment, diagnosis and plan of care documented in the resident's note.

## 2012-06-06 ENCOUNTER — Encounter: Payer: Self-pay | Admitting: Pulmonary Disease

## 2012-06-08 ENCOUNTER — Encounter (HOSPITAL_COMMUNITY): Payer: Medicare Other

## 2012-06-08 ENCOUNTER — Encounter: Payer: Self-pay | Admitting: Internal Medicine

## 2012-06-08 DIAGNOSIS — D509 Iron deficiency anemia, unspecified: Secondary | ICD-10-CM | POA: Insufficient documentation

## 2012-06-10 ENCOUNTER — Encounter (HOSPITAL_COMMUNITY): Payer: Medicare Other

## 2012-06-15 ENCOUNTER — Telehealth (HOSPITAL_COMMUNITY): Payer: Self-pay | Admitting: Cardiac Rehabilitation

## 2012-06-15 ENCOUNTER — Encounter (HOSPITAL_COMMUNITY): Payer: Medicare Other

## 2012-06-15 NOTE — Telephone Encounter (Signed)
pc to pt to inform of expired financial assistance agreement for pulmonary rehab.  New application mailed to pt home address.  Left message on answering machine for pt to return call

## 2012-06-17 ENCOUNTER — Encounter (HOSPITAL_COMMUNITY): Payer: Medicare Other

## 2012-06-22 ENCOUNTER — Encounter (HOSPITAL_COMMUNITY): Payer: Medicare Other

## 2012-06-24 ENCOUNTER — Ambulatory Visit (INDEPENDENT_AMBULATORY_CARE_PROVIDER_SITE_OTHER): Payer: Medicare Other | Admitting: Internal Medicine

## 2012-06-24 ENCOUNTER — Ambulatory Visit: Payer: Medicare Other

## 2012-06-24 ENCOUNTER — Encounter: Payer: Medicare Other | Admitting: Internal Medicine

## 2012-06-24 ENCOUNTER — Encounter: Payer: Self-pay | Admitting: Internal Medicine

## 2012-06-24 ENCOUNTER — Encounter (HOSPITAL_COMMUNITY): Payer: Medicare Other

## 2012-06-24 VITALS — BP 100/57 | HR 84 | Temp 97.1°F | Ht 62.0 in | Wt 189.2 lb

## 2012-06-24 DIAGNOSIS — Z0000 Encounter for general adult medical examination without abnormal findings: Secondary | ICD-10-CM

## 2012-06-24 DIAGNOSIS — Z9181 History of falling: Secondary | ICD-10-CM

## 2012-06-24 DIAGNOSIS — I2789 Other specified pulmonary heart diseases: Secondary | ICD-10-CM

## 2012-06-24 DIAGNOSIS — M797 Fibromyalgia: Secondary | ICD-10-CM

## 2012-06-24 DIAGNOSIS — J309 Allergic rhinitis, unspecified: Secondary | ICD-10-CM

## 2012-06-24 DIAGNOSIS — IMO0001 Reserved for inherently not codable concepts without codable children: Secondary | ICD-10-CM

## 2012-06-24 DIAGNOSIS — R296 Repeated falls: Secondary | ICD-10-CM

## 2012-06-24 DIAGNOSIS — I10 Essential (primary) hypertension: Secondary | ICD-10-CM

## 2012-06-24 DIAGNOSIS — I272 Pulmonary hypertension, unspecified: Secondary | ICD-10-CM

## 2012-06-24 DIAGNOSIS — F3289 Other specified depressive episodes: Secondary | ICD-10-CM

## 2012-06-24 DIAGNOSIS — E118 Type 2 diabetes mellitus with unspecified complications: Secondary | ICD-10-CM

## 2012-06-24 DIAGNOSIS — F329 Major depressive disorder, single episode, unspecified: Secondary | ICD-10-CM

## 2012-06-24 DIAGNOSIS — E119 Type 2 diabetes mellitus without complications: Secondary | ICD-10-CM

## 2012-06-24 DIAGNOSIS — IMO0002 Reserved for concepts with insufficient information to code with codable children: Secondary | ICD-10-CM

## 2012-06-24 DIAGNOSIS — E1165 Type 2 diabetes mellitus with hyperglycemia: Secondary | ICD-10-CM

## 2012-06-24 MED ORDER — CETIRIZINE HCL 10 MG PO TABS: 10.0000 mg | ORAL_TABLET | Freq: Every day | ORAL | Status: DC

## 2012-06-24 MED ORDER — INSULIN ASPART 100 UNIT/ML ~~LOC~~ SOLN: SUBCUTANEOUS | Status: DC

## 2012-06-24 MED ORDER — SERTRALINE HCL 50 MG PO TABS: 50.0000 mg | ORAL_TABLET | Freq: Every day | ORAL | Status: DC

## 2012-06-24 MED ORDER — CAPSAICIN 0.025 % EX CREA: TOPICAL_CREAM | Freq: Three times a day (TID) | CUTANEOUS | Status: DC

## 2012-06-24 MED ORDER — INSULIN DETEMIR 100 UNIT/ML ~~LOC~~ SOLN: 34.0000 [IU] | Freq: Two times a day (BID) | SUBCUTANEOUS | Status: DC

## 2012-06-24 NOTE — Assessment & Plan Note (Signed)
Assessment:  Pt is currently taking Wellbutrin and Cymbalta and is not controlled on this medication. Denies SI/HI. Other potential contributing factors towards her symptoms include limited social support and multiple chronic comorbidities. She is on Omeprazole, therefore, avoiding Celexa for now (although could consider low dose if needed in the future not more than 82m daily to help avoid prolonged QT as able).   Plan:  Change current regimen - will dc both wellbutrin and cymbalta as these have been ineffective for sx management.  Start Zoloft daily - pt advised to be aware for worsening depressive symptoms - if they arise, she should stop the medication and [place].  Advised to continue current meds until Zoloft RX filled so as to avoid withdrawal from Cymbalta.

## 2012-06-24 NOTE — Assessment & Plan Note (Signed)
Health Maintenance  Topic Date Due  . Urine Microalbumin  05/28/2012  . Hemoglobin A1c  08/04/2012  . Influenza Vaccine  10/11/2012  . Foot Exam  10/22/2012  . Lipid Panel  10/22/2012  . Ophthalmology Exam  02/09/2013  . Pap Smear  02/14/2013  . Mammogram  07/29/2013  . Pneumococcal Polysaccharide Vaccine (#2) 03/26/2014  . Colonoscopy  05/11/2016  . Tetanus/tdap  10/18/2016  . Zostavax  Completed    Assessment:  Procedures due: None  Labs due: microalb/cr  Immunizations due: None  Plan:  Check microalb/cr today.

## 2012-06-24 NOTE — Patient Instructions (Addendum)
General Instructions:  Please follow-up at the clinic in 1 month with your probable new PCP (Dr. Eppie Gibson), at which time we will reevaluate your diabetes, pain, depression - OR, please follow-up in the clinic sooner if needed.  There have been changes in your medications:  CHANGE your Levemir to 34 units twice daily, in the morning and at bedtime.  INCREASE your mealtime insulin to 16 units + the sliding scale insulin.  STOP wellbutrin and cymbalta  START zoloft (sertraline) for depression - if you have worsening symptoms, stop the medication and call the clinic immediately or go to the ER.  START Capsaicin cream 3-4 times daily on the affected areas for pain.    If you have been started on new medication(s), and you develop symptoms concerning for allergic reaction, including, but not limited to, throat closing, tongue swelling, rash, please stop the medication immediately and call the clinic at [phone], and go to the ER.  If you are diabetic, please bring your meter to your next visit.  If symptoms worsen, or new symptoms arise, please call the clinic or go to the ER.  PLEASE BRING ALL OF YOUR MEDICATIONS  IN A BAG TO YOUR NEXT APPOINTMENT   Treatment Goals:  Goals (1 Years of Data) as of 06/24/12         As of Today 06/02/12 06/01/12 05/15/12 05/11/12     Blood Pressure    . Blood Pressure < 140/90  100/57 122/58 118/60 119/79 119/63     Result Component    . HEMOGLOBIN A1C < 7.0          . LDL CALC < 70            Progress Toward Treatment Goals:  Treatment Goal 06/24/2012  Hemoglobin A1C unchanged  Blood pressure at goal  Stop smoking -    Self Care Goals & Plans:  Self Care Goal 06/24/2012  Manage my medications take my medicines as prescribed; bring my medications to every visit; refill my medications on time  Monitor my health keep track of my blood glucose; bring my glucose meter and log to each visit  Eat healthy foods drink diet soda or water instead of juice or  soda  Be physically active -  Stop smoking -    Home Blood Glucose Monitoring 06/24/2012  Check my blood sugar 3 times a day  When to check my blood sugar before meals      Sertraline tablets What is this medicine? SERTRALINE (SER tra leen) is used to treat depression. It may also be used to treat obsessive compulsive disorder, panic disorder, post-trauma stress, premenstrual dysphoric disorder (PMDD) or social anxiety. This medicine may be used for other purposes; ask your health care provider or pharmacist if you have questions. What should I tell my health care provider before I take this medicine? They need to know if you have any of these conditions: -bipolar disorder or a family history of bipolar disorder -diabetes -heart disease -liver disease -receiving electroconvulsive therapy -seizures (convulsions) -suicidal thoughts, plans, or attempt by you or a family member -an unusual or allergic reaction to sertraline, other medicines, foods, dyes, or preservatives -pregnant or trying to get pregnant -breast-feeding How should I use this medicine? Take this medicine by mouth with a glass of water. Follow the directions on the prescription label. You can take it with or without food. Take your medicine at regular intervals. Do not take your medicine more often than directed. Do not stop  taking this medicine suddenly except upon the advice of your doctor. Stopping this medicine too quickly may cause serious side effects or your condition may worsen. A special MedGuide will be given to you by the pharmacist with each prescription and refill. Be sure to read this information carefully each time. Talk to your pediatrician regarding the use of this medicine in children. While this drug may be prescribed for children as young as 7 years for selected conditions, precautions do apply. Overdosage: If you think you have taken too much of this medicine contact a poison control center or emergency  room at once. NOTE: This medicine is only for you. Do not share this medicine with others. What if I miss a dose? If you miss a dose, take it as soon as you can. If it is almost time for your next dose, take only that dose. Do not take double or extra doses. What may interact with this medicine? Do not take this medicine with any of the following medications: -linezolid -MAOIs like Carbex, Eldepryl, Marplan, Nardil, and Parnate -methylene blue (injected into a vein) -pimozide -thioridazine This medicine may also interact with the following medications: -alcohol -aspirin and aspirin-like medicines -certain medicines for depression, anxiety, or psychotic disturbances -certain medicines for migraine headaches like almotriptan, eletriptan, frovatriptan, naratriptan, rizatriptan, sumatriptan, zolmitriptan -certain medicines for seizures like carbamazepine, valproic acid, phenytoin -cimetidine -digoxin -diuretics -fentanyl -furazolidone -isoniazid -lithium -medicines that treat or prevent blood clots like warfarin, enoxaparin, and dalteparin -medicines to control heart rhythm like flecainide or propafenone -medicines for sleep -NSAIDs, medicines for pain and inflammation, like ibuprofen or naproxen -procarbazine -rasagiline -supplements like St. John's wort, kava kava, valerian -tolbutamide -tramadol -tryptophan This list may not describe all possible interactions. Give your health care provider a list of all the medicines, herbs, non-prescription drugs, or dietary supplements you use. Also tell them if you smoke, drink alcohol, or use illegal drugs. Some items may interact with your medicine. What should I watch for while using this medicine? Tell your doctor if your symptoms do not get better or if they get worse. Visit your doctor or health care professional for regular checks on your progress. Because it may take several weeks to see the full effects of this medicine, it is  important to continue your treatment as prescribed by your doctor. Patients and their families should watch out for new or worsening thoughts of suicide or depression. Also watch out for sudden changes in feelings such as feeling anxious, agitated, panicky, irritable, hostile, aggressive, impulsive, severely restless, overly excited and hyperactive, or not being able to sleep. If this happens, especially at the beginning of treatment or after a change in dose, call your health care professional. Dennis Bast may get drowsy or dizzy. Do not drive, use machinery, or do anything that needs mental alertness until you know how this medicine affects you. Do not stand or sit up quickly, especially if you are an older patient. This reduces the risk of dizzy or fainting spells. Alcohol may interfere with the effect of this medicine. Avoid alcoholic drinks. Your mouth may get dry. Chewing sugarless gum or sucking hard candy, and drinking plenty of water may help. Contact your doctor if the problem does not go away or is severe. What side effects may I notice from receiving this medicine? Side effects that you should report to your doctor or health care professional as soon as possible: -allergic reactions like skin rash, itching or hives, swelling of the face, lips, or tongue -  black or bloody stools, blood in the urine or vomit -fast, irregular heartbeat -feeling faint or lightheaded, falls -hallucination, loss of contact with reality -seizures -suicidal thoughts or other mood changes -unusual bleeding or bruising -unusually weak or tired -vomiting Side effects that usually do not require medical attention (report to your doctor or health care professional if they continue or are bothersome): -change in appetite -change in sex drive or performance -diarrhea -increased sweating -indigestion, nausea -tremors This list may not describe all possible side effects. Call your doctor for medical advice about side  effects. You may report side effects to FDA at 1-800-FDA-1088. Where should I keep my medicine? Keep out of the reach of children. Store at room temperature between 15 and 30 degrees C (59 and 86 degrees F). Throw away any unused medicine after the expiration date. NOTE: This sheet is a summary. It may not cover all possible information. If you have questions about this medicine, talk to your doctor, pharmacist, or health care provider.  2013, [employer]. (06/13/2011 8:59:59 PM)

## 2012-06-24 NOTE — Assessment & Plan Note (Addendum)
Pertinent Data Reviewed:  TEE (08/2010) - Peak RV-RA gradient 27m Hg  TTE (12/2010) - Pulmonary arteries: Systolic pressure was severely increased. PA peak pressure: 659mHg (S).  Assessment: Patient is chronically on ambulatory home oxygen at 1-2 L, started in setting of O2 sat < 88 % persistently in the past, in addition to severe pulmonary hypertension. Ambulatory oxygen saturation today is within normal limits. Most recent echo in 04/2012 does not comment on PA peak pressures and therefore, her indication for oxygen therapy cannot be evaluated today.  Plan:      I have contacted to echocardiography lab to request for a reread of her 04/2012 echo, with specificity of the PA peak pressure, to allow for assessment of her pulmonary hypertension (which may have improved after surgical replacement of her MV).  Defer paperwork for home O2 until I can review the updated report when available.

## 2012-06-24 NOTE — Assessment & Plan Note (Signed)
Pertinent Labs: Lab Results  Component Value Date   HGBA1C 8.4* 05/04/2012   HGBA1C 8.0 02/02/2012   CREATININE 1.32* 05/11/2012   CREATININE 1.16* 05/07/2012   MICROALBUR 0.59 05/29/2011   MICRALBCREAT 13.4 05/29/2011   CHOL 130 10/23/2011   HDL 33* 10/23/2011   TRIG 317* 10/23/2011    Assessment: Disease Control: fair control  Progress toward goals: unchanged  Barriers to meeting goals: no barriers identified    Continues to have hyperglycemia throughout the day, typically in the 200s mg/dL. Does not seem to be progressive after meals. Therefore, I am again inclined to escalate her levemir. As well, with concern that possibly this insulin may not be lasting throughout the day, I will split the dosage.  Plan: Glucometer log was reviewed today, as pt did have glucometer available for review.   Increase Levemir to total of 68 units daily - to be divided as 34 units BID  Increase meal coverage to 16 units TID + SSI.  reminded to bring blood glucose meter & log to each visit  Self management tools provided: copy of home glucose meter download  Home glucose monitoring recommendation: 3 times a day before meals

## 2012-06-24 NOTE — Assessment & Plan Note (Addendum)
Assessment: Patient has hx of chronic pain and is on chronic narcotics for several years. Today, she continues to have low back pain without radicular symptoms. In the past, she has had oversedation with resultant falls and respiratory compromise with addition of sedative medications (on top of her already existing multiple sedative medications). Therefore, I will not add or escalate her pain medication today. Avoiding oral NSAIDs for her chronic pain in setting of CKD. Will attempt topical capsaicin cream TID-QID.  However, if worsening sx, may consider additional back imaging studies given history of osteoporosis.  Plan:      Capsaicin cream TID-QID.

## 2012-06-24 NOTE — Assessment & Plan Note (Signed)
Pertinent Data: BP Readings from Last 3 Encounters:  06/24/12 100/57  06/02/12 122/58  06/01/12 858/85    Basic Metabolic Panel:    Component Value Date/Time   NA 137 05/11/2012 0915   K 4.7 05/11/2012 0915   CL 97 05/11/2012 0915   CO2 31 05/11/2012 0915   BUN 30* 05/11/2012 0915   CREATININE 1.32* 05/11/2012 0915   CREATININE 1.16* 05/07/2012 0744   GLUCOSE 179* 05/11/2012 0915   CALCIUM 9.0 05/11/2012 0915    Assessment: Disease Control: controlled  Progress toward goals: at goal  Barriers to meeting goals: no barriers identified    Patient is compliant most of the time with prescribed medications.   Plan:  continue current medications

## 2012-06-24 NOTE — Progress Notes (Signed)
Patient: [name]   MRN: [id number]  DOB: 08/03/1950  PCP: Annamarie Dawley, DO   Subjective:    CC: Follow-up, Diabetes, Medication Refill and Back Pain   HPI: [name] is a 62 y.o. female with a PMHx as outlined below, who presented to clinic today for the following:  1) DM2, uncontrolled - Goal 8 Lab Results  Component Value Date   HGBA1C 8.4* 05/04/2012   Patient checking blood sugars 3 times daily, before breakfast, lunch, and dinner. Reports fasting blood sugars of < 200 mg/dL - 170s mg/dL. Currently taking Levemir 66 units at bedtime, 14 units with meals + sliding scale insulin of 1:50 over 150 mg/dL (typically 16-24 units WC). She was escalated of her Levemir during our last visit together in 05/2012. Patient misses doses 0 x per week on average.  0 hypoglycemic episodes since last visit, last symptomatic hypoglycemia several years ago. admits to polyuria because is on lasix. Denies polydipsia, nausea, vomiting, diarrhea.   In regards to diabetic complications:  Microvascular complications: Confirms: peripheral neuropathy, nephropathy ; Denies retinopathy.  Macrovascular complications: Confirms: cerebrovascular disease and peripheral vascular disease ; Denies cardiovascular disease.   Important diabetic medications: Is patient on aspirin? Yes Is patient on statin? Yes Is patient on ARB? Yes  2) Depression - is not well controlled on current therapy - is on Cymbalta 90 mg (has been on these medications for 7-8 years), Wellbutrin XL 127m and Alprazolam 138mqHS. Feels that the Cymbalta (originally started for her fibromyalgia) and wellbutrin do not adequately control depression sx. Associated symptoms include:anhedonia, depressed mood, fatigue, feelings of worthlessness/guilt and hopelessness. Denies associated suicidal ideation, homicidal ideation, tearful. Currently, the patient does not follow with mental health services. Contributing causes include:  significant comorbidities that are not improving, limited social support.  3) Chronic pain - persistent chronic pain, involving low back, left hip. No radicular symptoms, no F/C, shooting pain of lower extremities. No stool or bladder incontinence.   Review of Systems: Per HPI.   Current Outpatient Medications: Medication Sig  . albuterol (PROVENTIL HFA;VENTOLIN HFA) 108 (90 BASE) MCG/ACT inhaler Inhale 2 puffs into the lungs every 6 (six) hours as needed. For wheeze or shortness of breath  . alendronate (FOSAMAX) 70 MG tablet Take 70 mg by mouth every 7 (seven) days. Takes on wednesdays  . ALPRAZolam (XANAX) 1 MG tablet 0.5 mg by mouth up to twice daily as needed for anxiety. AND [id number] bedtime for sleep  . aspirin EC 81 MG tablet Take 81 mg by mouth every evening.  . bMarland KitchenPROPion (WELLBUTRIN XL) 150 MG 24 hr tablet Take 1 tablet (150 mg total) by mouth daily with supper.  . calcium carbonate (OS-CAL) 600 MG TABS Take 1,200 mg by mouth daily.  . cetirizine (ZYRTEC ALLERGY) 10 MG tablet Take 1 tablet (10 mg total) by mouth daily. FOR ALLERGIES  . docusate sodium (COLACE) 50 MG capsule Take 100 mg by mouth every morning. Can Alternate with  senokot  . DULoxetine (CYMBALTA) 30 MG capsule Take 90 mg by mouth daily with breakfast.   . Elastic Bandages & Supports (TRUFORM STOCKINGS 20-30MMHG) MISC Wear compression stockings throughout the day, and take off at nighttime.  . ferrous gluconate (FERGON) 324 MG tablet Take 1 tablet (324 mg total) by mouth 2 (two) times daily with a meal.  . fluticasone (FLONASE) 50 MCG/ACT nasal spray Place 2 sprays into the nose daily. FOR NASAL CONGESTION  . Fluticasone-Salmeterol (ADVAIR DISKUS) 250-50  MCG/DOSE AEPB Inhale 1 puff into the lungs every 12 (twelve) hours.  . furosemide (LASIX) 80 MG tablet Take 80 mg by mouth 2 (two) times daily.  Marland Kitchen gabapentin (NEURONTIN) 300 MG capsule Take 300 mg by mouth 2 (two) times daily.  Marland Kitchen glucose blood (BAYER CONTOUR TEST) test  strip Use to test blood sugar 3 times daily.Dx code 250.00  . HYDROcodone-acetaminophen (NORCO/VICODIN) 5-325 MG per tablet Take 1-2 tablets by mouth every 6 (six) hours as needed for pain.  Marland Kitchen insulin aspart (NOVOLOG FLEXPEN) 100 UNIT/ML injection Takes 14 units with meals and also uses SSI with meals in addition to the 14 units of insulin  . insulin detemir (LEVEMIR) 100 UNIT/ML injection Inject 0.65 mLs (65 Units total) into the skin at bedtime.  Marland Kitchen lisinopril (PRINIVIL,ZESTRIL) 10 MG tablet Take 1 tablet (10 mg total) by mouth daily.  Marland Kitchen nystatin (MYCOSTATIN/NYSTOP) 100000 UNIT/GM POWD Apply 1 g topically daily as needed (; apply to stomach and under vreasts).  Marland Kitchen omeprazole (PRILOSEC) 40 MG capsule Take 1 capsule (40 mg total) by mouth 2 (two) times daily.  . potassium chloride SA (K-DUR,KLOR-CON) 20 MEQ tablet Take by mouth 2 (two) times daily. 2 tablets in the  Morning and 1 tablet at night  . rosuvastatin (CRESTOR) 20 MG tablet Take 1 tablet (20 mg total) by mouth daily with supper.  Marland Kitchen Spacer/Aero-Holding Chambers (AEROCHAMBER MV) inhaler Use as instructed with inhalers.  . tiotropium (SPIRIVA) 18 MCG inhalation capsule Place 1 capsule (18 mcg total) into inhaler and inhale daily with breakfast.    Allergies  Allergen Reactions  . Lorazepam     Patient's sister noted that ativan caused the patient to become extremely confused during hospitalization 09/2010  . Morphine     REACTION: Unknown reaction  . Oxycontin (Oxycodone) Other (See Comments)    headache  . Tramadol Hcl     REACTION: swelling    Past Medical History  Diagnosis Date  . Depression   . GERD (gastroesophageal reflux disease)     Followed by [name] // small bowel capsule endoscopy (11/2009) - minimal chronic inflammation, otherwise negative for  H.pylori, metaplasia, dysplasia  . Hypertension   . Hyperlipidemia   . Diabetes mellitus type II, uncontrolled     on insulin therapy  . COPD (chronic obstructive  pulmonary disease) 2008    started home O2 (12/2010) // (+) significant tobacco abuse  . Moderate to severe pulmonary hypertension     severe per TEE (08/2010) - Peak RV-RA gradient 92m Hg  . Mitral regurgitation     mild to moderate per TEE (08/2010)  . Shingles   . Iron deficiency anemia     Prior BL Hgb 9-11. Source initially unclear with EGD/Colonoscopy (05/2011) not indicative of active bleeding source // DX with RCC in 07/2011, s/p cryoablation - with subsequent normalization of Hgb. (2014)  . History of colonic polyps     Colonoscopy (10/2005) - colon polyp with path nonadenomatous, no malignancy - by Dr. [name] // Colonoscopy (02/2002) - 2 polyps noted, 1 - adenomatous, 2 - hyperplastic // Followed by Dr. [name]// Repeat Colonoscopy (05/2011) - single tubular adenoma polyp - performed by Dr. [name] . Anxiety   . IBS (irritable bowel syndrome)   . Osteoporosis     DEXA (12/2004) - L-spine T score -2.6, Left hip -0.1  . Fibromyalgia   . Obstructive sleep apnea     on CPAP // Sleep study (06/2009) - Moderate sleep apnea/ hypopnea  syndrome , AHI 17.8 per hour with nonpositional hypopneas. CPAP titration to 12 CWP, AHI 2.4 per hour. Small resMed Quattro full-face mask with heated humidifier  . Restless leg syndrome   . Mitral stenosis     moderate per TEE (08/2010) // Mitral valve replacement with a 27-mm pericardial porcine valve (Medtronic Mosaic valve, serial #[id number]) on 09/20/2010 - [name] // Followed at [facility], Dr. Stanford Breed  . Bilateral carotid artery stenosis     s/p right endarterectomy (06/2010). // Carotid US (07/2010) -  60%-79% ICA stenosis, mid range of scale by velocity.  Left: Moderate-to-severe calcific and non-calcific plaque origin andproximal ICA and ECA.  60%-79% ICA stenosis, highest end of scale // Followed by [name].  . Peripheral vascular disease   . Internal hemorrhoids     small internal hemorrhoids noted on colonoscopy 05/2011    . Clear cell renal cell carcinoma 07/21/2011    S/P cryoablation of left RCC in 09/2011 by [name] // Historically - MRI Abdomen (07/2011) - 2.7 x 2.7 x 3.0 cm enhancing solid renal mass posterior aspect of the lower pole of the left kidney. // CT Abdomen in 09/2010 showed no evidence of mass // Followed by [name]  [facility])   . Diastolic CHF     last echo (12/2010) showing grade 2 diastolic dysfunction. Otherwise, normal EF of 55-65%  . H/O mitral valve replacement 09/30/2011  . A-fib 10/22/2010    S/P Left atrial maze procedure for paroxysmal atrial fibrillation. 09/20/2010 Dr Lucianne Lei Kerby Less     Objective:    Physical Exam: Filed Vitals:   06/24/12 1331  BP: 100/57  Pulse: 84  Temp: 97.1 F (36.2 C)    Ambulatory O2 monitoring (off of oxygen):  O2 sat 92-94% Resting O2 monitoring (off of oxygen):  O2 sat 95%  General: Vital signs reviewed and noted. Well-developed, well-nourished, in no acute distress; alert, appropriate and cooperative throughout examination.  Head: Normocephalic, atraumatic.  Eyes: conjunctivae/corneas clear. PERRL.  Neck: No deformities, masses, or tenderness noted.  Lungs:  Normal respiratory effort and CTA BL.  Heart: RRR. S1 and S2 normal without gallop, rubs. (+) murmur.  Abdomen:  BS normoactive. Soft, Nondistended, non-tender.  No masses or organomegaly.  Extremities: Trace BL pretibial edema. TTP over lumbar paraspinal musculature. Negative straight leg raise. 5/5 muscle strength.     Assessment/ Plan:   The patient's case and plan of care was discussed with attending physician, Dr. [name].

## 2012-06-24 NOTE — Assessment & Plan Note (Signed)
Assessment: Pt has hx of multiple falls at home, at times 2/2 oversedation from her chronic sedating meds (narcotics, BZD, gabapentin) and at other times occuring without their influence. Last fall 2 weeks ago. I think she would benefit from neurorehab evaluation.  Plan:      Neurorehab referral.

## 2012-06-25 LAB — MICROALBUMIN / CREATININE URINE RATIO
Creatinine, Urine: 31.1 mg/dL
Microalb Creat Ratio: 16.1 mg/g (ref 0.0–30.0)
Microalb, Ur: 0.5 mg/dL (ref 0.00–1.89)

## 2012-06-25 NOTE — Progress Notes (Signed)
Quick Note:  Stable microalb/cr ratio. Continue current ACE-I. ______

## 2012-06-28 ENCOUNTER — Other Ambulatory Visit: Payer: Self-pay | Admitting: *Deleted

## 2012-06-28 DIAGNOSIS — F419 Anxiety disorder, unspecified: Secondary | ICD-10-CM

## 2012-06-28 MED ORDER — ALPRAZOLAM 1 MG PO TABS: ORAL_TABLET | ORAL | Status: DC

## 2012-06-28 NOTE — Progress Notes (Signed)
Case discussed with Dr. Kalia-Reynolds at the time of the visit.  We reviewed the resident's history and exam and pertinent patient test results.  I agree with the assessment, diagnosis and plan of care documented in the resident's note. 

## 2012-06-28 NOTE — Telephone Encounter (Signed)
Please call in her xanax

## 2012-06-29 ENCOUNTER — Encounter (HOSPITAL_COMMUNITY): Payer: Medicare Other

## 2012-06-29 NOTE — Telephone Encounter (Signed)
Rx called in to pharmacy. 

## 2012-07-01 ENCOUNTER — Encounter (HOSPITAL_COMMUNITY): Payer: Medicare Other

## 2012-07-01 ENCOUNTER — Other Ambulatory Visit: Payer: Self-pay | Admitting: *Deleted

## 2012-07-01 NOTE — Telephone Encounter (Signed)
[facility] written request last week for a 6 month supply, which I filled out. Please call tem to ensure that if I fill this RX for her hydrocodone, it will not be a duplicate.  Thank you! - [name], D.O., 07/01/2012, 5:08 PM

## 2012-07-02 ENCOUNTER — Telehealth: Payer: Self-pay | Admitting: Internal Medicine

## 2012-07-02 ENCOUNTER — Other Ambulatory Visit: Payer: Self-pay | Admitting: *Deleted

## 2012-07-02 MED ORDER — HYDROCODONE-ACETAMINOPHEN 5-325 MG PO TABS: 1.0000 | ORAL_TABLET | Freq: Four times a day (QID) | ORAL | Status: DC | PRN

## 2012-07-02 NOTE — Telephone Encounter (Signed)
Please call in her narc. IDK if the regill paperwork is still in the box near [place]? I completed it during our visit 5/15. Thank you! - [name], D.O., 07/02/2012, 5:21 PM

## 2012-07-02 NOTE — Telephone Encounter (Signed)
Hi, i do not see that the med list has been changed and PPA has no record of a refill faxed last week, could you go ahead and do it again, i will call to ensure only the right amt is done and change the med list so it will reflect this refill? Thanks, call me with questions

## 2012-07-02 NOTE — Telephone Encounter (Addendum)
  INTERNAL MEDICINE RESIDENCY PROGRAM After-Hours Telephone Call   Late Entry:  Reason for call:   I received a call from Ms. Zaryah A Kinnear on 07/02/2012 at 7:00AM indicating that since stopping her cymbalta 2 days ago, she has started to have diffuse muscle aches and pains despite starting the Zoloft that she was prescribed on 5/15 for refractory depression that was not controlled on her prior SNRI.     Pertinent Data:   Depression not controlled on Wellbutrin or Cymbalta --> changed to Zoloft.    Assessment / Plan / Recommendations:   I think that these symptoms are secondary to too quick of a discontinuation of her Cymbalta despite new SSRI usage.   I therefore advised Ms. Clydean A Recendiz to resume her Cymbalta 53m daily x 1 week, then decrease to 640mx 1 week, then 3036m 1 week, then stop.   She is to restart the Zoloft at week two of the Cymbalta taper.  She is in agreement and is able to repeat back to me the instructions provided.  As always, pt is advised that if symptoms worsen or new symptoms arise, they should go to an urgent care facility or to to ER for further evaluation.    MaiAnnamarie DawleyO   07/02/2012, 8:04 PM

## 2012-07-03 MED ORDER — DULOXETINE HCL 30 MG PO CPEP: 90.0000 mg | ORAL_CAPSULE | Freq: Every day | ORAL | Status: DC

## 2012-07-03 MED ORDER — SERTRALINE HCL 50 MG PO TABS: 50.0000 mg | ORAL_TABLET | Freq: Every day | ORAL | Status: DC

## 2012-07-06 ENCOUNTER — Encounter (HOSPITAL_COMMUNITY): Payer: Medicare Other

## 2012-07-06 NOTE — Telephone Encounter (Signed)
Called to pharm 

## 2012-07-08 ENCOUNTER — Encounter (HOSPITAL_COMMUNITY): Payer: Medicare Other

## 2012-07-12 ENCOUNTER — Telehealth: Payer: Self-pay | Admitting: *Deleted

## 2012-07-12 NOTE — Telephone Encounter (Signed)
Can we look into what is happening with neuro rehab for her? She was supposed to send in some financial documents to pulm rehab, they have a referral already - so i think the holdup was her paperwork that she needs to submit. But, idk if we can call both places and ask?  Also, I told Ms. Hoffart that we will try to get Dr. Eppie Gibson as her PCP (I asked him and he was ok with it), but couldn't guarantee it.  Please let me know if i can do something to help.  Thank you! - [name], D.O., 07/12/2012, 4:57 PM

## 2012-07-12 NOTE — Telephone Encounter (Signed)
Pt calls and states she needs an appt w/ pulm. Rehab and neuro rehab for her falls, states she has not heard anything. Would like an appt w/ dr Eppie Gibson asap- he is to be her new pcp per dr Guy Sandifer

## 2012-07-13 ENCOUNTER — Encounter (HOSPITAL_COMMUNITY): Payer: Medicare Other

## 2012-07-13 ENCOUNTER — Encounter: Payer: Self-pay | Admitting: *Deleted

## 2012-07-14 NOTE — Telephone Encounter (Signed)
Thank you! - [name], D.O., 07/14/2012, 9:36 AM

## 2012-07-14 NOTE — Telephone Encounter (Signed)
Letter sent to pt to call Neuro-rehab directly for appt. Yvonna Alanis, RN, 07/13/12, 6:10P

## 2012-07-15 ENCOUNTER — Telehealth: Payer: Self-pay | Admitting: Internal Medicine

## 2012-07-15 DIAGNOSIS — I272 Pulmonary hypertension, unspecified: Secondary | ICD-10-CM

## 2012-07-15 DIAGNOSIS — R600 Localized edema: Secondary | ICD-10-CM

## 2012-07-15 MED ORDER — TRUFORM STOCKINGS 20-30MMHG MISC: Status: DC

## 2012-07-15 NOTE — Telephone Encounter (Signed)
  INTERNAL MEDICINE RESIDENCY PROGRAM After-Hours Telephone Call    Reason for call:   I placed an outgoing call to Ms. Alisabeth A Mckenney at 1:12 PM, 07/15/2012 regarding need for repeat echocardiogram. Specifically, she is on oxygen therapy for her pulmonary hypertension and prior ambulatory hypoxia. Most recent echo in 04/2012 when she was hospitalized for acute respiratory failure - was unable to comment on PA peak pressures because of poor quality study. I asked Dr. Acie Fredrickson to reread and unfortunately still was unable determine peak pressures because of poor quality study. Therefore, I informed her for need for repeat Echo to assist with assessment of her current status of the pulmonary hypertension, and thereby the need for continuous oxygen therapy.  During our discussion, Ms. Antonacci also described worsening LE edema, can be asymmetric at times, but is generally symmetric. No worsening SOB, CP, etc. She is advised to come to clinic for assessment to see if needed adjustments to diuretics and or if doppler evaluation is needed.    Assessment/ Plan:   Pulmonary hypertension - will get repeat Echo.  LE edema - unclear exact etiology, dependent edema vs 2/2 her CHF - although cannot rule out VTE without evaluation. Will need clinic appt and go to ED if worsening sx arise. She understands and is agreeable.   As always, pt is advised that if symptoms worsen or new symptoms arise, they should go to an urgent care facility or to to ER for further evaluation.  Will ask Lela to schedule echo and call pt with clinic appt.    [place], DO   07/15/2012, 1:12 PM

## 2012-07-19 ENCOUNTER — Other Ambulatory Visit: Payer: Self-pay | Admitting: *Deleted

## 2012-07-19 DIAGNOSIS — F419 Anxiety disorder, unspecified: Secondary | ICD-10-CM

## 2012-07-19 DIAGNOSIS — J449 Chronic obstructive pulmonary disease, unspecified: Secondary | ICD-10-CM

## 2012-07-19 DIAGNOSIS — E119 Type 2 diabetes mellitus without complications: Secondary | ICD-10-CM

## 2012-07-19 MED ORDER — TIOTROPIUM BROMIDE MONOHYDRATE 18 MCG IN CAPS: 18.0000 ug | ORAL_CAPSULE | Freq: Every day | RESPIRATORY_TRACT | Status: DC

## 2012-07-19 MED ORDER — INSULIN ASPART 100 UNIT/ML ~~LOC~~ SOLN: SUBCUTANEOUS | Status: DC

## 2012-07-19 MED ORDER — "INSULIN SYRINGE 31G X 5/16"" 0.3 ML MISC": 1.0000 | Freq: Two times a day (BID) | Status: DC

## 2012-07-21 ENCOUNTER — Ambulatory Visit (INDEPENDENT_AMBULATORY_CARE_PROVIDER_SITE_OTHER): Payer: Medicare Other | Admitting: Internal Medicine

## 2012-07-21 ENCOUNTER — Encounter: Payer: Self-pay | Admitting: Internal Medicine

## 2012-07-21 ENCOUNTER — Other Ambulatory Visit: Payer: Self-pay | Admitting: *Deleted

## 2012-07-21 ENCOUNTER — Other Ambulatory Visit (HOSPITAL_COMMUNITY): Payer: Medicare Other

## 2012-07-21 ENCOUNTER — Ambulatory Visit (HOSPITAL_COMMUNITY)
Admission: RE | Admit: 2012-07-21 | Discharge: 2012-07-21 | Disposition: A | Discharge status: Home / Self Care | Payer: Medicare Other | Source: Ambulatory Visit | Attending: Internal Medicine | Admitting: Internal Medicine

## 2012-07-21 VITALS — BP 105/66 | HR 94 | Temp 97.3°F | Ht 62.0 in | Wt 196.5 lb

## 2012-07-21 DIAGNOSIS — I079 Rheumatic tricuspid valve disease, unspecified: Secondary | ICD-10-CM | POA: Insufficient documentation

## 2012-07-21 DIAGNOSIS — J4489 Other specified chronic obstructive pulmonary disease: Secondary | ICD-10-CM | POA: Insufficient documentation

## 2012-07-21 DIAGNOSIS — E119 Type 2 diabetes mellitus without complications: Secondary | ICD-10-CM | POA: Insufficient documentation

## 2012-07-21 DIAGNOSIS — J449 Chronic obstructive pulmonary disease, unspecified: Secondary | ICD-10-CM | POA: Insufficient documentation

## 2012-07-21 DIAGNOSIS — I2789 Other specified pulmonary heart diseases: Secondary | ICD-10-CM

## 2012-07-21 DIAGNOSIS — I10 Essential (primary) hypertension: Secondary | ICD-10-CM | POA: Insufficient documentation

## 2012-07-21 DIAGNOSIS — E785 Hyperlipidemia, unspecified: Secondary | ICD-10-CM | POA: Insufficient documentation

## 2012-07-21 DIAGNOSIS — J989 Respiratory disorder, unspecified: Secondary | ICD-10-CM | POA: Insufficient documentation

## 2012-07-21 DIAGNOSIS — I272 Pulmonary hypertension, unspecified: Secondary | ICD-10-CM

## 2012-07-21 DIAGNOSIS — I059 Rheumatic mitral valve disease, unspecified: Secondary | ICD-10-CM | POA: Insufficient documentation

## 2012-07-21 DIAGNOSIS — I369 Nonrheumatic tricuspid valve disorder, unspecified: Secondary | ICD-10-CM

## 2012-07-21 MED ORDER — FUROSEMIDE 80 MG PO TABS: ORAL_TABLET | ORAL | Status: DC

## 2012-07-21 NOTE — Progress Notes (Signed)
Case discussed with Dr. Sandy Salaam at the time of the visit.  We reviewed the resident's history and exam and pertinent patient test results.  I agree with the assessment, diagnosis and plan of care documented in the resident's note.

## 2012-07-21 NOTE — Patient Instructions (Signed)
Take 1.5 lasix in the morning and 1 in the evening. Come back in 1 week

## 2012-07-21 NOTE — Progress Notes (Signed)
Patient ID: [name], female   DOB: Jan 07, 1951, 62 y.o.   MRN: [id number]   Subjective:   Patient ID: [name] female   DOB: [birth year] 62 y.o.   MRN: [id number]  HPI: [name] is a 62 y.o. female with history outline below presenting to the clinic for evaluation of LE swelling.  She has grade 2 dCHF as well as pulmonary HTN. She was called by PCP 6/5 to arrange repeat echocardiogram and at that time complained of worsening LE edema. She is currently on lasix 51m BID. Her weight is up 7lbs from her visit last month. Says she first noticed swelling in ankles, then up to legs. Skin feels tight. No fever/chills. Denies any worsening of baseline dyspnea or chest discomfort.  Started back smoking 1ppd  Visit cut short due to echocardiogram appt at 1400.     Past Medical History  Diagnosis Date  . Depression   . GERD (gastroesophageal reflux disease)     Followed by Dr. [name]// small bowel capsule endoscopy (11/2009) - minimal chronic inflammation, otherwise negative for  H.pylori, metaplasia, dysplasia  . Hypertension   . Hyperlipidemia   . Diabetes mellitus type II, uncontrolled     on insulin therapy  . COPD (chronic obstructive pulmonary disease) 2008    started home O2 (12/2010) // (+) significant tobacco abuse  . Moderate to severe pulmonary hypertension     severe per TEE (08/2010) - Peak RV-RA gradient 94mHg  . Mitral regurgitation     mild to moderate per TEE (08/2010)  . Shingles   . Iron deficiency anemia     Prior BL Hgb 9-11. Source initially unclear with EGD/Colonoscopy (05/2011) not indicative of active bleeding source // DX with RCC in 07/2011, s/p cryoablation - with subsequent normalization of Hgb. (2014)  . History of colonic polyps     Colonoscopy (10/2005) - colon polyp with path nonadenomatous, no malignancy - by Dr. HaAmedeo Plenty// Colonoscopy (02/2002) - 2 polyps noted, 1 - adenomatous, 2 - hyperplastic // Followed by Dr. HaAmedeo Plenty/ Repeat  Colonoscopy (05/2011) - single tubular adenoma polyp - performed by Dr. ScMichail Sermon. Anxiety   . IBS (irritable bowel syndrome)   . Osteoporosis     DEXA (12/2004) - L-spine T score -2.6, Left hip -0.1  . Fibromyalgia   . Obstructive sleep apnea     on CPAP // Sleep study (06/2009) - Moderate sleep apnea/ hypopnea syndrome , AHI 17.8 per hour with nonpositional hypopneas. CPAP titration to 12 CWP, AHI 2.4 per hour. Small resMed Quattro full-face mask with heated humidifier  . Restless leg syndrome   . Mitral stenosis     moderate per TEE (08/2010) // Mitral valve replacement with a 27-mm pericardial porcine valve (Medtronic Mosaic valve, serial #[id number] 09/20/2010 - PeIvin Poot/ [facility], Dr. CrStanford Breed. Bilateral carotid artery stenosis     s/p right endarterectomy (06/2010). // Carotid [place]2012) -  60%-79% ICA stenosis, mid range of scale by velocity.  Left: Moderate-to-severe calcific and non-calcific plaque origin andproximal ICA and ECA.  60%-79% ICA stenosis, highest end of scale // Followed by Dr. BrTrula Slade . Peripheral vascular disease   . Internal hemorrhoids     small internal hemorrhoids noted on colonoscopy 05/2011  . Clear cell renal cell carcinoma 07/21/2011    S/P cryoablation of left RCC in 09/2011 by Dr. YaKathlene Cote/ Historically - MRI Abdomen (07/2011) - 2.7 x 2.7  x 3.0 cm enhancing solid renal mass posterior aspect of the lower pole of the left kidney. // CT Abdomen in 09/2010 showed no evidence of mass // Followed by [name]  [facility])   . Diastolic CHF     last echo (12/2010) showing grade 2 diastolic dysfunction. Otherwise, normal EF of 55-65%  . H/O mitral valve replacement 09/30/2011  . A-fib 10/22/2010    S/P Left atrial maze procedure for paroxysmal atrial fibrillation. 09/20/2010 Dr Prescott Gum    Current Outpatient Prescriptions  Medication Sig Dispense Refill  . ACCU-CHEK FASTCLIX LANCETS MISC 1 each by Does not apply  route 3 (three) times daily before meals. Check blood sugars 3 times daily before breakfast, lunch, and dinner DX code: 250.02  102 each  12  . albuterol (PROVENTIL HFA;VENTOLIN HFA) 108 (90 BASE) MCG/ACT inhaler Inhale 2 puffs into the lungs every 6 (six) hours as needed. For wheeze or shortness of breath  3 Inhaler  3  . alendronate (FOSAMAX) 70 MG tablet Take 70 mg by mouth every 7 (seven) days. Takes on wednesdays      . ALPRAZolam (XANAX) 1 MG tablet 0.5 mg by mouth up to twice daily as needed for anxiety. AND 58m at bedtime for sleep  60 tablet  5  . aspirin EC 81 MG tablet Take 81 mg by mouth every evening.      . calcium carbonate (OS-CAL) 600 MG TABS Take 1,200 mg by mouth daily.      . capsaicin (ZOSTRIX) 0.025 % cream Apply topically 3 (three) times daily. Apply to affected areas 3-4 times daily.  60 g  0  . cetirizine (ZYRTEC ALLERGY) 10 MG tablet Take 1 tablet (10 mg total) by mouth daily. FOR ALLERGIES  90 tablet  1  . docusate sodium (COLACE) 50 MG capsule Take 100 mg by mouth every morning. Can Alternate with  senokot      . DULoxetine (CYMBALTA) 30 MG capsule Take 3 capsules (90 mg total) by mouth daily. 981mdaily x 1 week, then 604maily x 1 week, then 43m38mily x 1 week, then stop.  60 capsule  0  . Elastic Bandages & Supports (TRUFORM STOCKINGS 20-30MMHG) MISC Wear compression stockings throughout the day, and take off at nighttime.  2 each  1  . ferrous gluconate (FERGON) 324 MG tablet Take 1 tablet (324 mg total) by mouth 2 (two) times daily with a meal.  120 tablet  3  . fluticasone (FLONASE) 50 MCG/ACT nasal spray Place 2 sprays into the nose daily. FOR NASAL CONGESTION  16 g  2  . Fluticasone-Salmeterol (ADVAIR DISKUS) 250-50 MCG/DOSE AEPB Inhale 1 puff into the lungs every 12 (twelve) hours.  3 each  3  . furosemide (LASIX) 80 MG tablet Take 80 mg by mouth 2 (two) times daily.      . gaMarland Kitchenapentin (NEURONTIN) 300 MG capsule Take 300 mg by mouth 2 (two) times daily.      .  gMarland Kitchenucose blood (BAYER CONTOUR TEST) test strip Use to test blood sugar 3 times daily.Dx code 250.00  100 each  12  . HYDROcodone-acetaminophen (NORCO/VICODIN) 5-325 MG per tablet Take 1-2 tablets by mouth every 6 (six) hours as needed for pain.  180 tablet  5  . insulin aspart (NOVOLOG FLEXPEN) 100 UNIT/ML injection Takes 16 units with meals and also uses SSI with meals in addition to the 16 units of insulin  15 pen  3  . insulin detemir (LEVEMIR) 100  UNIT/ML injection Inject 0.34 mLs (34 Units total) into the skin 2 (two) times daily.  10 mL    . Insulin Pen Needle (B-D UF III MINI PEN NEEDLES) 31G X 5 MM MISC DX: 250.02 Please check blood sugars 3 times daily.  100 each  11  . Insulin Syringe-Needle U-100 (INSULIN SYRINGE .3CC/31GX5/16") 31G X 5/16" 0.3 ML MISC 1 Syringe by Does not apply route 2 (two) times daily at 10 AM and 5 PM.  100 each  5  . lisinopril (PRINIVIL,ZESTRIL) 10 MG tablet Take 1 tablet (10 mg total) by mouth daily.  30 tablet  3  . nystatin (MYCOSTATIN/NYSTOP) 100000 UNIT/GM POWD Apply 1 g topically daily as needed (; apply to stomach and under breasts).       Marland Kitchen omeprazole (PRILOSEC) 40 MG capsule Take 1 capsule (40 mg total) by mouth 2 (two) times daily.  60 capsule  5  . potassium chloride SA (K-DUR,KLOR-CON) 20 MEQ tablet Take by mouth 2 (two) times daily. 2 tablets in the  Morning and 1 tablet at night      . rosuvastatin (CRESTOR) 20 MG tablet Take 1 tablet (20 mg total) by mouth daily with supper.  30 tablet  5  . sertraline (ZOLOFT) 50 MG tablet Take 1 tablet (50 mg total) by mouth daily.  30 tablet  0  . Spacer/Aero-Holding Chambers (AEROCHAMBER MV) inhaler Use as instructed with inhalers.  1 each  0  . tiotropium (SPIRIVA) 18 MCG inhalation capsule Place 1 capsule (18 mcg total) into inhaler and inhale daily with breakfast.  90 capsule  0   No current facility-administered medications for this visit.   Family History  Problem Relation Age of Onset  . Heart disease  Father 11    died to MI at 68yo  . Heart attack Father   . Heart disease Brother 98    died of MI at 20yo  . Heart attack Brother    History   Social History  . Marital Status: Divorced    Spouse Name: N/A    Number of Children: N/A  . Years of Education: N/A   Social History Main Topics  . Smoking status: Current Every Day Smoker -- 1.00 packs/day for 50 years    Types: Cigarettes    Last Attempt to Quit: 07/12/2010  . Smokeless tobacco: Never Used     Comment: quit 02/01/2012-uses electronic cigarettes only  . Alcohol Use: No  . Drug Use: No  . Sexually Active: No   Other Topics Concern  . None   Social History Narrative  . None   Review of Systems: 10 pt ROS performed, pertinent positives and negatives noted in HPI Objective:  Physical Exam: Filed Vitals:   07/21/12 1320  BP: 105/66  Pulse: 94  Temp: 97.3 F (36.3 C)  TempSrc: Oral  Height: _0  (1.575 m)  Weight: 196 lb 8 oz (89.132 kg)  SpO2: 95%   Vitals reviewed. General: wheelchair, NAD HEENT: PERRL, EOMI, no scleral icterus Cardiac: regular rate and rhythm, + murmur Pulm:fine bibasilar rales Abd: soft, nontender, nondistended, BS present Ext: 2+ symmetric pitting edema bilateral feet to knee, skin slightly erythematous. No celluitic changesNegative homan's, no cord palpated.    Assessment & Plan:   Only addressed LE edema due to need for pt to leave appt early. Will have her return next week to address other chronic issues.

## 2012-07-21 NOTE — Telephone Encounter (Signed)
Call from [facility] - needs qty# 75 for one month supply instead of #45.  Thanks

## 2012-07-21 NOTE — Assessment & Plan Note (Signed)
Worsening LE edema over past 2.5-3 weeks, 7lb weight gain. No change in baseline dyspnea. - increase lasix to 120 q am and 80qpm - echo today - f/u in 1 week

## 2012-07-21 NOTE — Progress Notes (Signed)
Echo Lab  2D Echocardiogram completed.  [place], [place] 07/21/2012 4:24 PM

## 2012-07-23 ENCOUNTER — Telehealth: Payer: Self-pay | Admitting: Cardiology

## 2012-07-23 NOTE — Telephone Encounter (Signed)
Per pt call - states she saw PCP on Tuesday and edema was evaluated by her PCP  Her furosemide was increased and she reports seeing some improvement of swelling.  Pt has a follow up appt with her PCP Wednesday next week.  She is instructed to keep feet and legs elevated above the level of her heart as much as possible to weigh daily and decrease her NA intake as much as possible.  She was also instructed to stop smoking again!!  She states understanding of all the above and is in agreement.  She will call back if further problems/concerns  She was scheduled to see Dr Stanford Breed back as she is overdue for follow up.

## 2012-07-23 NOTE — Telephone Encounter (Signed)
New Problem  Pt states she is having pain and swelling in both legs. She said she called the clinic she normally sees and was advised to go to the ED. She said she would rather speak with the nurse here and see what her heart doctor feels is the best thing for her to do.

## 2012-07-26 ENCOUNTER — Ambulatory Visit: Payer: Medicare Other | Admitting: Oncology

## 2012-07-26 ENCOUNTER — Other Ambulatory Visit: Payer: Medicare Other | Admitting: Lab

## 2012-07-28 ENCOUNTER — Ambulatory Visit (INDEPENDENT_AMBULATORY_CARE_PROVIDER_SITE_OTHER): Payer: Medicare Other | Admitting: Internal Medicine

## 2012-07-28 ENCOUNTER — Encounter: Payer: Self-pay | Admitting: Internal Medicine

## 2012-07-28 VITALS — BP 100/58 | HR 83 | Temp 98.2°F | Wt 191.3 lb

## 2012-07-28 DIAGNOSIS — I272 Pulmonary hypertension, unspecified: Secondary | ICD-10-CM

## 2012-07-28 DIAGNOSIS — E1165 Type 2 diabetes mellitus with hyperglycemia: Secondary | ICD-10-CM

## 2012-07-28 DIAGNOSIS — E118 Type 2 diabetes mellitus with unspecified complications: Secondary | ICD-10-CM

## 2012-07-28 DIAGNOSIS — Z720 Tobacco use: Secondary | ICD-10-CM

## 2012-07-28 DIAGNOSIS — F17200 Nicotine dependence, unspecified, uncomplicated: Secondary | ICD-10-CM | POA: Insufficient documentation

## 2012-07-28 DIAGNOSIS — IMO0002 Reserved for concepts with insufficient information to code with codable children: Secondary | ICD-10-CM

## 2012-07-28 DIAGNOSIS — B372 Candidiasis of skin and nail: Secondary | ICD-10-CM | POA: Insufficient documentation

## 2012-07-28 DIAGNOSIS — F3289 Other specified depressive episodes: Secondary | ICD-10-CM

## 2012-07-28 DIAGNOSIS — I2789 Other specified pulmonary heart diseases: Secondary | ICD-10-CM

## 2012-07-28 DIAGNOSIS — F329 Major depressive disorder, single episode, unspecified: Secondary | ICD-10-CM

## 2012-07-28 DIAGNOSIS — Z9181 History of falling: Secondary | ICD-10-CM

## 2012-07-28 DIAGNOSIS — L304 Erythema intertrigo: Secondary | ICD-10-CM

## 2012-07-28 DIAGNOSIS — E119 Type 2 diabetes mellitus without complications: Secondary | ICD-10-CM

## 2012-07-28 DIAGNOSIS — I5032 Chronic diastolic (congestive) heart failure: Secondary | ICD-10-CM

## 2012-07-28 DIAGNOSIS — I10 Essential (primary) hypertension: Secondary | ICD-10-CM

## 2012-07-28 DIAGNOSIS — L538 Other specified erythematous conditions: Secondary | ICD-10-CM

## 2012-07-28 DIAGNOSIS — I509 Heart failure, unspecified: Secondary | ICD-10-CM

## 2012-07-28 DIAGNOSIS — R296 Repeated falls: Secondary | ICD-10-CM

## 2012-07-28 HISTORY — DX: Tobacco use: Z72.0

## 2012-07-28 LAB — GLUCOSE, CAPILLARY: Glucose-Capillary: 265 mg/dL — ABNORMAL HIGH (ref 70–99)

## 2012-07-28 LAB — POCT GLYCOSYLATED HEMOGLOBIN (HGB A1C): Hemoglobin A1C: 8.2

## 2012-07-28 MED ORDER — INSULIN ASPART 100 UNIT/ML ~~LOC~~ SOLN: SUBCUTANEOUS | Status: DC

## 2012-07-28 MED ORDER — SERTRALINE HCL 100 MG PO TABS: 100.0000 mg | ORAL_TABLET | Freq: Every day | ORAL | Status: DC

## 2012-07-28 MED ORDER — NYSTATIN 100000 UNIT/GM EX CREA: TOPICAL_CREAM | Freq: Two times a day (BID) | CUTANEOUS | Status: DC

## 2012-07-28 MED ORDER — INSULIN DETEMIR 100 UNIT/ML ~~LOC~~ SOLN: 36.0000 [IU] | Freq: Two times a day (BID) | SUBCUTANEOUS | Status: DC

## 2012-07-28 NOTE — Assessment & Plan Note (Addendum)
Interval improvement in LE edema after increasing lasix from 80/80 to 120/80. Five lb weight loss. Still w bilateral edema, pain. No dizziness - check bmet today - continue 120/80 as renal func tolerates  ADDENDUM 07/29/2012 2:08 PM  BMET    Component Value Date/Time   NA 134* 07/28/2012 1409   K 4.7 07/28/2012 1409   CL 98 07/28/2012 1409   CO2 29 07/28/2012 1409   GLUCOSE 224* 07/28/2012 1409   BUN 34* 07/28/2012 1409   CREATININE 1.32* 07/28/2012 1409   CREATININE 1.16* 05/07/2012 0744   CALCIUM 9.6 07/28/2012 1409   GFRNONAA 49* 05/07/2012 0744   GFRAA 57* 05/07/2012 0744   Cr is 1.32, stable from 1.32 one month prior. Will continue lasix 120/80 for 7 more days, then instructed pt to decrease back to 80BID. To RTC Monday for fitting for compression stockings. F/U appt mid July

## 2012-07-28 NOTE — Assessment & Plan Note (Signed)
BP Readings from Last 3 Encounters:  07/28/12 100/58  07/21/12 105/66  06/24/12 100/57    Lab Results  Component Value Date   NA 137 05/11/2012   K 4.7 05/11/2012   CREATININE 1.32* 05/11/2012    Assessment: Blood pressure control: controlled Progress toward BP goal:  at goal Comments: compliant w mes  Plan: Medications:  continue current medications

## 2012-07-28 NOTE — Assessment & Plan Note (Signed)
  Assessment: Progress toward smoking cessation:  smoking the same amount Barriers to progress toward smoking cessation:  lack of motivation to quit;stress Comments:previous successful cessation w patches, does not want to do patches anymore. Would liek to try ecigs which she has at home. Counseled at length the health perils of continuing tobacco abuse.   Plan: Instruction/counseling given:  I counseled patient on the dangers of tobacco use, advised patient to stop smoking, and reviewed strategies to maximize success. Medications to assist with smoking cessation:  None

## 2012-07-28 NOTE — Progress Notes (Signed)
Case discussed with Dr. Iran Sizer immediately after the resident saw the patient. We reviewed the resident's history and exam and pertinent patient test results. I agree with the assessment, diagnosis and plan of care documented in the resident's note

## 2012-07-28 NOTE — Assessment & Plan Note (Addendum)
Repeat echo w PA pressure 75mHg, much improved since MV replacement 2 yrs ago. Ambulated patient off of continuous O2, oxygen sat nadir of 89%. May be able to come off of continuous O2. - gave pt # to call and schedule appt w pulm rehab (Dr. CGwenette Greethas made the referral at prior visit).

## 2012-07-28 NOTE — Assessment & Plan Note (Signed)
Referred to neurorehab. They called to schedule an appointment for the patient. She said she would prefer to wait until her lower extremity pain and swelling improve. She reports that she will call them in the next couple weeks to schedule an evaluation.

## 2012-07-28 NOTE — Assessment & Plan Note (Addendum)
Now off of cymbalta and welbutrin, just on zoloft for past couple of weeks. On 42m, says minimal help.  Will increase to 1069mqd today, reeval at next visit, may need alternative agent

## 2012-07-28 NOTE — Assessment & Plan Note (Signed)
>>  ASSESSMENT AND PLAN FOR CHRONIC CHF (CONGESTIVE HEART FAILURE) (HCC) WRITTEN ON 07/29/2012  2:18 PM BY ZIEMER, CAROLYN, MD  Interval improvement in LE edema after increasing lasix from 80/80 to 120/80. Five lb weight loss. Still w bilateral edema, pain. No dizziness - check bmet today - continue 120/80 as renal func tolerates  ADDENDUM 07/29/2012 2:08 PM  BMET    Component Value Date/Time   NA 134* 07/28/2012 1409   K 4.7 07/28/2012 1409   CL 98 07/28/2012 1409   CO2 29 07/28/2012 1409   GLUCOSE 224* 07/28/2012 1409   BUN 34* 07/28/2012 1409   CREATININE 1.32* 07/28/2012 1409   CREATININE 1.16* 05/07/2012 0744   CALCIUM 9.6 07/28/2012 1409   GFRNONAA 49* 05/07/2012 0744   GFRAA 57* 05/07/2012 0744   Cr is 1.32, stable from 1.32 one month prior. Will continue lasix 120/80 for 7 more days, then instructed pt to decrease back to 80BID. To RTC Monday for fitting for compression stockings. F/U appt mid July

## 2012-07-28 NOTE — Patient Instructions (Addendum)
1. Call pulmonary rehab to schedule your appointment. We may be able to get you off oxygen -- if you cut back smoking! 2. Increase zoloft to 127m daily. I have sent a new prescription for 1026mdose. 3. Increase levemir to 36units TWICE a day and mealtime novolog to 18 units.  4. Keep taking 120 lasix in the morning and 80 in the evening. I will call about labs. 5. I have written a script for nystatin cream.  Treatment Goals:  Goals (1 Years of Data) as of 07/28/12         As of Today 07/21/12 06/24/12 06/02/12 06/01/12     Blood Pressure    . Blood Pressure < 140/90  100/58 105/66 100/57 122/58 118/60     Result Component    . HEMOGLOBIN A1C < 7.0  8.2        . LDL CALC < 70            Progress Toward Treatment Goals:  Treatment Goal 06/24/2012  Hemoglobin A1C unchanged  Blood pressure at goal  Stop smoking stopped smoking  Prevent falls unchanged    Self Care Goals & Plans:  Self Care Goal 07/21/2012  Manage my medications take my medicines as prescribed; bring my medications to every visit; refill my medications on time  Monitor my health -  Eat healthy foods drink diet soda or water instead of juice or soda; eat more vegetables; eat foods that are low in salt  Be physically active -  Stop smoking call [facility] (1-800-QUIT-NOW)    Home Blood Glucose Monitoring 06/24/2012  Check my blood sugar 3 times a day  When to check my blood sugar before meals     Care Management & Community Referrals:  Referral 06/24/2012  Referrals made for care management support diabetes educator

## 2012-07-28 NOTE — Assessment & Plan Note (Signed)
Lab Results  Component Value Date   HGBA1C 8.2 07/28/2012   HGBA1C 8.4* 05/04/2012   HGBA1C 8.0 02/02/2012     Assessment: Diabetes control: fair control Progress toward A1C goal:  unchanged Comments: Levemir 34BID and novolog 16 TID then SSI. Total mealtime insulin 20-30units based on blood glucose log.   Plan: Medications:  Increase levemir to 36 BID, increase mealtime novolog to 18U TID, continue SSI Instruction/counseling given: reminded to bring blood glucose meter & log to each visit

## 2012-07-28 NOTE — Telephone Encounter (Signed)
Talked with pharmacy - last refill 07/07/12 - did get ok for 6 month refill on pain med from Dr Guy Sandifer in 06/2012.

## 2012-07-28 NOTE — Progress Notes (Addendum)
Patient ID: [name], female   DOB: [birth year], 62 y.o.   MRN: [id number]  Subjective:   Patient ID: [name] female   DOB: 04-05-1950 62 y.o.   MRN: [id number]  HPI: [name] is a 62 y.o. female with multiple medical problems as listed below presenting to the clinic for follow up of LE edema.  LE edema She has grade 2 dCHF as well as pulmonary HTN. Seen last week in acute visit complaining of several weeks of worsening LE edema. At this appt noted to have 7lb weight gain, no change in baseline dyspnea. Lasix increased 173m q am and 814mqpm for past week. She reports interval improvement in LE edema, but still feels tight, swollen and uncomfortable.  Over past week, weight down 5 lbs 196-->191.  Pulm HTN She also had repeat echo for monitoring of moderate to severe pulm HTN w prior peak PA pressure of 9572m. Results show PA pressure now of 41m75m From review of prior notes, it appears that her pulm HTN was one of the pain reasons for her need for continuous oxygen therapy. Follows w Dr. ClanGwenette Greet COPD, has been referred to pulmonary rehab.   DM Last A1c 8.4. Currently on Levemir 34U BID and 16U mealtime coverage + SSI. With her SSI she is actually taking 20-30 units novolog with each meal. Blood sugars (including fasting, preprandial and postprandial) remain in 250-300s with little variation throughout the day.   Depression Recently tapered off of cymbalta and welbutrin. On zoloft 50mg42mgle agent now for about 2 weeks. Can't tell much difference in depression sx. Strange dreams. No SI/HI.  Fall risk  Referred to neurorehab. Patient says she was called to schedule eval. Wants to wait until LE pain/edema improve.  Smoking Recently started back, 1ppd. Knows how bad it is for her. Has some ecigs and wants to try those for now.    Past Medical History  Diagnosis Date  . Depression   . GERD (gastroesophageal reflux disease)     Followed by Dr. HayesAmedeo Plentymall  bowel capsule endoscopy (11/2009) - minimal chronic inflammation, otherwise negative for  H.pylori, metaplasia, dysplasia  . Hypertension   . Hyperlipidemia   . Diabetes mellitus type II, uncontrolled     on insulin therapy  . COPD (chronic obstructive pulmonary disease) 2008    started home O2 (12/2010) // (+) significant tobacco abuse  . Moderate to severe pulmonary hypertension     severe per TEE (08/2010) - Peak RV-RA gradient 95mm 72m. Mitral regurgitation     mild to moderate per TEE (08/2010)  . Shingles   . Iron deficiency anemia     Prior BL Hgb 9-11. Source initially unclear with EGD/Colonoscopy (05/2011) not indicative of active bleeding source // DX with RCC in 07/2011, s/p cryoablation - with subsequent normalization of Hgb. (2014)  . History of colonic polyps     Colonoscopy (10/2005) - colon polyp with path nonadenomatous, no malignancy - by Dr. Hayes.Amedeo Plentyolonoscopy (02/2002) - 2 polyps noted, 1 - adenomatous, 2 - hyperplastic // Followed by Dr. Hayes Amedeo Plentypeat Colonoscopy (05/2011) - single tubular adenoma polyp - performed by Dr. SchoolMichail Sermonxiety   . IBS (irritable bowel syndrome)   . Osteoporosis     DEXA (12/2004) - L-spine T score -2.6, Left hip -0.1  . Fibromyalgia   . Obstructive sleep apnea     on CPAP // Sleep study (06/2009) - Moderate sleep apnea/ hypopnea  syndrome , AHI 17.8 per hour with nonpositional hypopneas. CPAP titration to 12 CWP, AHI 2.4 per hour. Small resMed Quattro full-face mask with heated humidifier  . Restless leg syndrome   . Mitral stenosis     moderate per TEE (08/2010) // Mitral valve replacement with a 27-mm pericardial porcine valve (Medtronic Mosaic valve, serial #[id number]) on 09/20/2010 - [name] // Followed at [facility], Dr. Stanford Breed  . Bilateral carotid artery stenosis     s/p right endarterectomy (06/2010). // Carotid US (07/2010) -  60%-79% ICA stenosis, mid range of scale by velocity.  Left: Moderate-to-severe  calcific and non-calcific plaque origin andproximal ICA and ECA.  60%-79% ICA stenosis, highest end of scale // Followed by [name].  . Peripheral vascular disease   . Internal hemorrhoids     small internal hemorrhoids noted on colonoscopy 05/2011  . Clear cell renal cell carcinoma 07/21/2011    S/P cryoablation of left RCC in 09/2011 by [name] // Historically - MRI Abdomen (07/2011) - 2.7 x 2.7 x 3.0 cm enhancing solid renal mass posterior aspect of the lower pole of the left kidney. // CT Abdomen in 09/2010 showed no evidence of mass // Followed by [name]  [facility])   . Diastolic CHF     last echo (12/2010) showing grade 2 diastolic dysfunction. Otherwise, normal EF of 55-65%  . H/O mitral valve replacement 09/30/2011  . A-fib 10/22/2010    S/P Left atrial maze procedure for paroxysmal atrial fibrillation. 09/20/2010 Dr Prescott Gum    Current Outpatient Prescriptions  Medication Sig Dispense Refill  . ACCU-CHEK FASTCLIX LANCETS MISC 1 each by Does not apply route 3 (three) times daily before meals. Check blood sugars 3 times daily before breakfast, lunch, and dinner DX code: 250.02  102 each  12  . albuterol (PROVENTIL HFA;VENTOLIN HFA) 108 (90 BASE) MCG/ACT inhaler Inhale 2 puffs into the lungs every 6 (six) hours as needed. For wheeze or shortness of breath  3 Inhaler  3  . alendronate (FOSAMAX) 70 MG tablet Take 70 mg by mouth every 7 (seven) days. Takes on wednesdays      . ALPRAZolam (XANAX) 1 MG tablet 0.5 mg by mouth up to twice daily as needed for anxiety. AND 23m at bedtime for sleep  60 tablet  5  . aspirin EC 81 MG tablet Take 81 mg by mouth every evening.      . calcium carbonate (OS-CAL) 600 MG TABS Take 1,200 mg by mouth daily.      . capsaicin (ZOSTRIX) 0.025 % cream Apply topically 3 (three) times daily. Apply to affected areas 3-4 times daily.  60 g  0  . cetirizine (ZYRTEC ALLERGY) 10 MG tablet Take 1 tablet (10 mg total) by mouth daily. FOR ALLERGIES   90 tablet  1  . docusate sodium (COLACE) 50 MG capsule Take 100 mg by mouth every morning. Can Alternate with  senokot      . Elastic Bandages & Supports (TRUFORM STOCKINGS 20-30MMHG) MISC Wear compression stockings throughout the day, and take off at nighttime.  2 each  1  . ferrous gluconate (FERGON) 324 MG tablet Take 1 tablet (324 mg total) by mouth 2 (two) times daily with a meal.  120 tablet  3  . fluticasone (FLONASE) 50 MCG/ACT nasal spray Place 2 sprays into the nose daily. FOR NASAL CONGESTION  16 g  2  . Fluticasone-Salmeterol (ADVAIR DISKUS) 250-50 MCG/DOSE AEPB Inhale 1 puff into the lungs  every 12 (twelve) hours.  3 each  3  . furosemide (LASIX) 80 MG tablet Take 1.5 tablets in the morning and 1 tablet in the evening  75 tablet  2  . gabapentin (NEURONTIN) 300 MG capsule Take 300 mg by mouth 2 (two) times daily.      Marland Kitchen glucose blood (BAYER CONTOUR TEST) test strip Use to test blood sugar 3 times daily.Dx code 250.00  100 each  12  . HYDROcodone-acetaminophen (NORCO/VICODIN) 5-325 MG per tablet Take 1-2 tablets by mouth every 6 (six) hours as needed for pain.  180 tablet  5  . insulin aspart (NOVOLOG FLEXPEN) 100 UNIT/ML injection Take 18 units with meals and also use SSI with meals in addition to the 16 units of insulin  15 pen  3  . insulin detemir (LEVEMIR) 100 UNIT/ML injection Inject 0.36 mLs (36 Units total) into the skin 2 (two) times daily.  10 mL  3  . Insulin Pen Needle (B-D UF III MINI PEN NEEDLES) 31G X 5 MM MISC DX: 250.02 Please check blood sugars 3 times daily.  100 each  11  . Insulin Syringe-Needle U-100 (INSULIN SYRINGE .3CC/31GX5/16") 31G X 5/16" 0.3 ML MISC 1 Syringe by Does not apply route 2 (two) times daily at 10 AM and 5 PM.  100 each  5  . lisinopril (PRINIVIL,ZESTRIL) 10 MG tablet Take 1 tablet (10 mg total) by mouth daily.  30 tablet  3  . nystatin cream (MYCOSTATIN) Apply topically 2 (two) times daily.  30 g  0  . omeprazole (PRILOSEC) 40 MG capsule Take 1  capsule (40 mg total) by mouth 2 (two) times daily.  60 capsule  5  . potassium chloride SA (K-DUR,KLOR-CON) 20 MEQ tablet Take by mouth 2 (two) times daily. 2 tablets in the  Morning and 1 tablet at night      . rosuvastatin (CRESTOR) 20 MG tablet Take 1 tablet (20 mg total) by mouth daily with supper.  30 tablet  5  . sertraline (ZOLOFT) 100 MG tablet Take 1 tablet (100 mg total) by mouth daily.  30 tablet  2  . Spacer/Aero-Holding Chambers (AEROCHAMBER MV) inhaler Use as instructed with inhalers.  1 each  0  . tiotropium (SPIRIVA) 18 MCG inhalation capsule Place 1 capsule (18 mcg total) into inhaler and inhale daily with breakfast.  90 capsule  0   No current facility-administered medications for this visit.   Family History  Problem Relation Age of Onset  . Heart disease Father 47    died to MI at 66yo  . Heart attack Father   . Heart disease Brother 47    died of MI at 70yo  . Heart attack Brother    History   Social History  . Marital Status: Divorced    Spouse Name: N/A    Number of Children: N/A  . Years of Education: N/A   Social History Main Topics  . Smoking status: Current Every Day Smoker -- 1.00 packs/day for 50 years    Types: Cigarettes    Last Attempt to Quit: 07/12/2010  . Smokeless tobacco: Never Used     Comment: quit 02/01/2012-uses electronic cigarettes only  . Alcohol Use: No  . Drug Use: No  . Sexually Active: No   Other Topics Concern  . None   Social History Narrative  . None   Review of Systems: 10 pt ROS performed, pertinent positives and negatives noted in HPI Objective:  Physical Exam: Filed Vitals:  07/28/12 1324 07/28/12 1347  BP: 100/58   Pulse: 83   Temp: 98.2 F (36.8 C)   TempSrc: Oral   Weight: 191 lb 4.8 oz (86.773 kg)   SpO2: 96% 96%   General: wheelchair, NAD  HEENT: PERRL, EOMI, no scleral icterus  Cardiac: regular rate and rhythm, + murmur loudest at apex Pulm:clear, no rales Abd: soft, nontender, nondistended, BS  present  Erythematous, macerated skin w foul odor under R sided pannus Ext: 1+ symmetric pitting edema bilateral feet to below the knee, skin slightly erythematous. No celluitic changes   Assessment & Plan:   Please see problem-based charting for assessment and plan.

## 2012-07-28 NOTE — Assessment & Plan Note (Signed)
Candida intertrigo R pannus. Patient prefers nystatin cream to powder. Rx for nystatin cream Discussed use of cornstarch for prophylaxis

## 2012-07-29 LAB — BASIC METABOLIC PANEL WITH GFR
BUN: 34 mg/dL — ABNORMAL HIGH (ref 6–23)
CO2: 29 mEq/L (ref 19–32)
Calcium: 9.6 mg/dL (ref 8.4–10.5)
Chloride: 98 mEq/L (ref 96–112)
Creat: 1.32 mg/dL — ABNORMAL HIGH (ref 0.50–1.10)
GFR, Est African American: 50 mL/min — ABNORMAL LOW
GFR, Est Non African American: 43 mL/min — ABNORMAL LOW
Glucose, Bld: 224 mg/dL — ABNORMAL HIGH (ref 70–99)
Potassium: 4.7 mEq/L (ref 3.5–5.3)
Sodium: 134 mEq/L — ABNORMAL LOW (ref 135–145)

## 2012-07-29 NOTE — Addendum Note (Signed)
Addended by: Trinda Pascal on: 07/29/2012 02:18 PM   Modules accepted: Orders

## 2012-07-30 ENCOUNTER — Ambulatory Visit: Payer: Medicare Other | Admitting: Internal Medicine

## 2012-08-03 ENCOUNTER — Encounter: Payer: Self-pay | Admitting: Internal Medicine

## 2012-08-03 ENCOUNTER — Ambulatory Visit (INDEPENDENT_AMBULATORY_CARE_PROVIDER_SITE_OTHER): Payer: Medicare Other | Admitting: Internal Medicine

## 2012-08-03 ENCOUNTER — Ambulatory Visit: Payer: Medicare Other

## 2012-08-03 VITALS — BP 115/61 | HR 86 | Temp 98.2°F | Wt 194.3 lb

## 2012-08-03 DIAGNOSIS — M810 Age-related osteoporosis without current pathological fracture: Secondary | ICD-10-CM

## 2012-08-03 DIAGNOSIS — I872 Venous insufficiency (chronic) (peripheral): Secondary | ICD-10-CM

## 2012-08-03 DIAGNOSIS — E1149 Type 2 diabetes mellitus with other diabetic neurological complication: Secondary | ICD-10-CM

## 2012-08-03 DIAGNOSIS — F32A Depression, unspecified: Secondary | ICD-10-CM

## 2012-08-03 DIAGNOSIS — F3289 Other specified depressive episodes: Secondary | ICD-10-CM

## 2012-08-03 DIAGNOSIS — E1140 Type 2 diabetes mellitus with diabetic neuropathy, unspecified: Secondary | ICD-10-CM

## 2012-08-03 DIAGNOSIS — IMO0001 Reserved for inherently not codable concepts without codable children: Secondary | ICD-10-CM

## 2012-08-03 DIAGNOSIS — M797 Fibromyalgia: Secondary | ICD-10-CM

## 2012-08-03 DIAGNOSIS — R609 Edema, unspecified: Secondary | ICD-10-CM

## 2012-08-03 DIAGNOSIS — F329 Major depressive disorder, single episode, unspecified: Secondary | ICD-10-CM

## 2012-08-03 DIAGNOSIS — G589 Mononeuropathy, unspecified: Secondary | ICD-10-CM

## 2012-08-03 LAB — BASIC METABOLIC PANEL WITH GFR
BUN: 32 mg/dL — ABNORMAL HIGH (ref 6–23)
CO2: 27 mEq/L (ref 19–32)
Calcium: 9.7 mg/dL (ref 8.4–10.5)
Chloride: 102 mEq/L (ref 96–112)
Creat: 1.27 mg/dL — ABNORMAL HIGH (ref 0.50–1.10)
GFR, Est African American: 52 mL/min — ABNORMAL LOW
GFR, Est Non African American: 45 mL/min — ABNORMAL LOW
Glucose, Bld: 211 mg/dL — ABNORMAL HIGH (ref 70–99)
Potassium: 5.1 mEq/L (ref 3.5–5.3)
Sodium: 139 mEq/L (ref 135–145)

## 2012-08-03 MED ORDER — ALOE VESTA 2-N-1 PROTECTIVE EX OINT: TOPICAL_OINTMENT | Freq: Two times a day (BID) | CUTANEOUS | Status: DC | PRN

## 2012-08-03 NOTE — Patient Instructions (Signed)
It was nice to see you again.  I look forward to caring for you for years to come.  1) Keep taking the zoloft.  If it is helping your mood the change may have been a good one.  If it was good we will continue it and either increase your gabapentin for your pain or consider low dose long acting morphine.  2)  Keep taking the 1 1/2 tablets of lasix in the morning and 1 tablet in the evening.  I will call if your kidneys are not working well and we need to make a change.  3) I wrote for the aloe vera and sent it to the pharmacy to see if they have it.  4) Keep taking your other medications as prescribed.  5) I will see you in 2 months, sooner if necessary.

## 2012-08-04 ENCOUNTER — Encounter: Payer: Self-pay | Admitting: Internal Medicine

## 2012-08-04 DIAGNOSIS — H269 Unspecified cataract: Secondary | ICD-10-CM | POA: Insufficient documentation

## 2012-08-04 DIAGNOSIS — K644 Residual hemorrhoidal skin tags: Secondary | ICD-10-CM | POA: Insufficient documentation

## 2012-08-04 DIAGNOSIS — I872 Venous insufficiency (chronic) (peripheral): Secondary | ICD-10-CM

## 2012-08-04 HISTORY — DX: Venous insufficiency (chronic) (peripheral): I87.2

## 2012-08-04 HISTORY — DX: Residual hemorrhoidal skin tags: K64.4

## 2012-08-04 MED ORDER — INSULIN ASPART 100 UNIT/ML ~~LOC~~ SOLN: SUBCUTANEOUS | Status: DC

## 2012-08-04 MED ORDER — POTASSIUM CHLORIDE CRYS ER 20 MEQ PO TBCR: 40.0000 meq | EXTENDED_RELEASE_TABLET | Freq: Every day | ORAL | Status: DC

## 2012-08-04 MED ORDER — CALCIUM CARB-CHOLECALCIFEROL 600-800 MG-UNIT PO TABS: 2.0000 | ORAL_TABLET | Freq: Every day | ORAL | Status: DC

## 2012-08-04 NOTE — Assessment & Plan Note (Addendum)
Lab Results  Component Value Date   HGBA1C 8.2 07/28/2012   HGBA1C 8.4* 05/04/2012   HGBA1C 8.0 02/02/2012     Assessment: Diabetes control: fair control Progress toward A1C goal:  unchanged  Plan: Medications:  continue current medications Home glucose monitoring: Frequency:   Timing:   Instruction/counseling given: reminded to bring medications to each visit and discussed the need for weight loss Educational resources provided:   Self management tools provided:   Other plans: Hgb A1C was 8.2 last week.  We will continue current insulin regimen and work on treatment of her LE edema and depression in hopes of improving her activity and health engagement.  If the LE pain persists secondary to the diabetic neuropathy we will consider increasing the gabapentin at the follow-up visit, especially if the Zoloft is working better than the Cymbalta was.

## 2012-08-04 NOTE — Assessment & Plan Note (Signed)
Please see fibromyalgia section above.  Mood subjectively improved on the Zoloft after 2 weeks.  Will continue and reassess at follow-up.

## 2012-08-04 NOTE — Assessment & Plan Note (Signed)
She presents today for followup of her lower extremity edema after a change in her Lasix dose last week. She was increased from 80 mg by mouth twice a day to 120 mg by mouth in the morning and 80 mg by mouth in the evening. Since making the change she has noted a decrease in her edema although she is not free of lower extremity fluid. At this point, the edema is not impacting upon her ability to ambulate. Her creatinine is stable at 1.3 which is slightly elevated from baseline. As this seems to be a good compromise in controlling her lower extremity edema to facilitate her ambulation and maintaining decent renal function we will continue the Lasix at 120 mg by mouth in the morning and 80 mg by mouth in the evening. It should be noted that because of the lower extremity edema is likely multifactorial and include chronic venous insufficiency and moderate pulmonary hypertension and cor pulmonale from her COPD.  Her potassium is slightly elevated at 5.1 on replacement with potassium chloride 40 mEq in the morning and 20 mEq in the evening. We will decrease the potassium supplementation to just 40 mEq in the morning.

## 2012-08-04 NOTE — Assessment & Plan Note (Signed)
Echo (12/16/2010): Grade II diastolic dysfunction

## 2012-08-04 NOTE — Assessment & Plan Note (Signed)
She has noted an increase in her diffuse muscular pain since stopping the Cymbalta and starting the Zoloft. She admits that her mood is likely improved on the Zoloft compared to the Cymbalta. That being said, the Cymbalta may have been more effective at controlling her fibromyalgia. She was willing to continue with the Zoloft given her improved mood for a few more weeks and then reassess both her depression and anxiety and fibromyalgia. If the Zoloft is helpful in controlling her depression and better than the Cymbalta, we will consider treating her fibromyalgia with alternative agents including increasing her gabapentin dose.

## 2012-08-04 NOTE — Progress Notes (Signed)
  Subjective:    Patient ID: [name], female    DOB: [birth year], 62 y.o.   MRN: [id number]  HPI  Please see the A&P for the status of the pt's chronic medical problems.  Review of Systems  Constitutional: Negative for activity change, appetite change and unexpected weight change.  HENT: Positive for congestion, rhinorrhea and trouble swallowing.   Eyes: Negative for pain and visual disturbance.  Respiratory: Positive for shortness of breath. Negative for cough, chest tightness, wheezing and stridor.   Cardiovascular: Positive for leg swelling. Negative for chest pain and palpitations.  Gastrointestinal: Positive for constipation. Negative for nausea, vomiting, abdominal pain, diarrhea and abdominal distention.  Musculoskeletal: Positive for myalgias. Negative for back pain, joint swelling and arthralgias.  Skin: Negative for color change, pallor, rash and wound.  Allergic/Immunologic: Positive for environmental allergies.  Neurological: Positive for numbness. Negative for seizures, syncope and light-headedness.  Psychiatric/Behavioral: Positive for dysphoric mood. Negative for confusion. The patient is nervous/anxious.       Objective:   Physical Exam  Nursing note and vitals reviewed. Constitutional: She is oriented to person, place, and time. She appears well-developed and well-nourished. No distress.  HENT:  Head: Normocephalic and atraumatic.  Eyes: Conjunctivae are normal. Right eye exhibits no discharge. Left eye exhibits no discharge. No scleral icterus.  Neck: Neck supple.  Cardiovascular: Normal rate, regular rhythm and normal heart sounds.  Exam reveals no gallop and no friction rub.   No murmur heard. Pulmonary/Chest: Effort normal and breath sounds normal. No respiratory distress. She has no wheezes. She has no rales.  Abdominal: Soft. Bowel sounds are normal. She exhibits no distension. There is no tenderness. There is no rebound and no guarding.   Musculoskeletal: Normal range of motion. She exhibits edema and tenderness.  Neurological: She is alert and oriented to person, place, and time. She exhibits normal muscle tone.  Skin: Skin is warm and dry. No rash noted. She is not diaphoretic. No erythema. No pallor.  Psychiatric: She has a normal mood and affect. Her behavior is normal. Judgment and thought content normal.      Assessment & Plan:   Please see problem oriented charting.

## 2012-08-04 NOTE — Assessment & Plan Note (Signed)
>>  ASSESSMENT AND PLAN FOR CHRONIC CHF (CONGESTIVE HEART FAILURE) (HCC) WRITTEN ON 08/04/2012 10:35 AM BY Rocco Serene, MD  Echo (12/16/2010): Grade II diastolic dysfunction

## 2012-08-12 ENCOUNTER — Other Ambulatory Visit (HOSPITAL_COMMUNITY): Payer: Self-pay | Admitting: Interventional Radiology

## 2012-08-12 DIAGNOSIS — N2889 Other specified disorders of kidney and ureter: Secondary | ICD-10-CM

## 2012-08-16 ENCOUNTER — Other Ambulatory Visit: Payer: Medicare Other

## 2012-08-16 ENCOUNTER — Ambulatory Visit: Payer: Medicare Other | Admitting: Neurosurgery

## 2012-08-16 ENCOUNTER — Other Ambulatory Visit: Payer: Self-pay | Admitting: Internal Medicine

## 2012-08-23 ENCOUNTER — Other Ambulatory Visit: Payer: Self-pay | Admitting: Internal Medicine

## 2012-08-23 DIAGNOSIS — E1143 Type 2 diabetes mellitus with diabetic autonomic (poly)neuropathy: Secondary | ICD-10-CM

## 2012-08-23 DIAGNOSIS — K219 Gastro-esophageal reflux disease without esophagitis: Secondary | ICD-10-CM

## 2012-08-24 ENCOUNTER — Ambulatory Visit (HOSPITAL_COMMUNITY): Payer: Medicare Other

## 2012-08-24 ENCOUNTER — Ambulatory Visit (INDEPENDENT_AMBULATORY_CARE_PROVIDER_SITE_OTHER): Payer: Medicare Other | Admitting: Internal Medicine

## 2012-08-24 ENCOUNTER — Ambulatory Visit (HOSPITAL_COMMUNITY)
Admission: RE | Admit: 2012-08-24 | Discharge: 2012-08-24 | Disposition: A | Discharge status: Home / Self Care | Payer: Medicare Other | Source: Ambulatory Visit | Attending: Internal Medicine | Admitting: Internal Medicine

## 2012-08-24 ENCOUNTER — Other Ambulatory Visit (HOSPITAL_COMMUNITY): Payer: Medicare Other

## 2012-08-24 ENCOUNTER — Encounter: Payer: Self-pay | Admitting: Internal Medicine

## 2012-08-24 VITALS — BP 97/56 | HR 79 | Temp 97.7°F | Ht 61.25 in | Wt 194.4 lb

## 2012-08-24 DIAGNOSIS — R071 Chest pain on breathing: Secondary | ICD-10-CM | POA: Insufficient documentation

## 2012-08-24 DIAGNOSIS — R609 Edema, unspecified: Secondary | ICD-10-CM

## 2012-08-24 DIAGNOSIS — J984 Other disorders of lung: Secondary | ICD-10-CM | POA: Insufficient documentation

## 2012-08-24 DIAGNOSIS — R600 Localized edema: Secondary | ICD-10-CM

## 2012-08-24 DIAGNOSIS — R1012 Left upper quadrant pain: Secondary | ICD-10-CM

## 2012-08-24 DIAGNOSIS — D7389 Other diseases of spleen: Secondary | ICD-10-CM | POA: Insufficient documentation

## 2012-08-24 DIAGNOSIS — Z85528 Personal history of other malignant neoplasm of kidney: Secondary | ICD-10-CM | POA: Insufficient documentation

## 2012-08-24 LAB — COMPLETE METABOLIC PANEL WITH GFR
ALT: 14 U/L (ref 0–35)
AST: 15 U/L (ref 0–37)
Albumin: 3.5 g/dL (ref 3.5–5.2)
Alkaline Phosphatase: 42 U/L (ref 39–117)
BUN: 29 mg/dL — ABNORMAL HIGH (ref 6–23)
CO2: 28 mEq/L (ref 19–32)
Calcium: 9.3 mg/dL (ref 8.4–10.5)
Chloride: 100 mEq/L (ref 96–112)
Creat: 1.12 mg/dL — ABNORMAL HIGH (ref 0.50–1.10)
GFR, Est African American: 60 mL/min
GFR, Est Non African American: 52 mL/min — ABNORMAL LOW
Glucose, Bld: 157 mg/dL — ABNORMAL HIGH (ref 70–99)
Potassium: 4.5 mEq/L (ref 3.5–5.3)
Sodium: 138 mEq/L (ref 135–145)
Total Bilirubin: 0.2 mg/dL — ABNORMAL LOW (ref 0.3–1.2)
Total Protein: 6.7 g/dL (ref 6.0–8.3)

## 2012-08-24 LAB — CBC WITH DIFFERENTIAL/PLATELET
Basophils Absolute: 0 10*3/uL (ref 0.0–0.1)
Basophils Relative: 0 % (ref 0–1)
Eosinophils Absolute: 0.1 10*3/uL (ref 0.0–0.7)
Eosinophils Relative: 1 % (ref 0–5)
HCT: 33 % — ABNORMAL LOW (ref 36.0–46.0)
Hemoglobin: 11 g/dL — ABNORMAL LOW (ref 12.0–15.0)
Lymphocytes Relative: 19 % (ref 12–46)
Lymphs Abs: 1.5 10*3/uL (ref 0.7–4.0)
MCH: 31.6 pg (ref 26.0–34.0)
MCHC: 33.3 g/dL (ref 30.0–36.0)
MCV: 94.8 fL (ref 78.0–100.0)
Monocytes Absolute: 0.8 10*3/uL (ref 0.1–1.0)
Monocytes Relative: 10 % (ref 3–12)
Neutro Abs: 5.6 10*3/uL (ref 1.7–7.7)
Neutrophils Relative %: 70 % (ref 43–77)
Platelets: 213 10*3/uL (ref 150–400)
RBC: 3.48 MIL/uL — ABNORMAL LOW (ref 3.87–5.11)
RDW: 14.5 % (ref 11.5–15.5)
WBC: 7.9 10*3/uL (ref 4.0–10.5)

## 2012-08-24 LAB — LIPASE: Lipase: 15 U/L (ref 11–59)

## 2012-08-24 LAB — LACTIC ACID, PLASMA: LACTIC ACID: 1.1 mmol/L (ref 0.5–2.2)

## 2012-08-24 NOTE — Patient Instructions (Signed)
Start drinking contrast at 8 am.  Liquids only at 7 am.   Be at [facility] CT scan area for 10 am CT scan Call to follow up with [name].  Try Benadryl or Hydrocortisone Cream to insect bite on your arm.   Insect Bite Mosquitoes, flies, fleas, bedbugs, and many other insects can bite. Insect bites are different from insect stings. A sting is when venom is injected into the skin. Some insect bites can transmit infectious diseases. SYMPTOMS  Insect bites usually turn red, swell, and itch for 2 to 4 days. They often go away on their own. TREATMENT  Your caregiver may prescribe antibiotic medicines if a bacterial infection develops in the bite. HOME CARE INSTRUCTIONS  Do not scratch the bite area.  Keep the bite area clean and dry. Wash the bite area thoroughly with soap and water.  Put ice or cool compresses on the bite area.  Put ice in a plastic bag.  Place a towel between your skin and the bag.  Leave the ice on for 20 minutes, 4 times a day for the first 2 to 3 days, or as directed.  You may apply a baking soda paste, cortisone cream, or calamine lotion to the bite area as directed by your caregiver. This can help reduce itching and swelling.  Only take over-the-counter or prescription medicines as directed by your caregiver.  If you are given antibiotics, take them as directed. Finish them even if you start to feel better. You may need a tetanus shot if:  You cannot remember when you had your last tetanus shot.  You have never had a tetanus shot.  The injury broke your skin. If you get a tetanus shot, your arm may swell, get red, and feel warm to the touch. This is common and not a problem. If you need a tetanus shot and you choose not to have one, there is a rare chance of getting tetanus. Sickness from tetanus can be serious. SEEK IMMEDIATE MEDICAL CARE IF:   You have increased pain, redness, or swelling in the bite area.  You see a red line on the skin coming from  the bite.  You have a fever.  You have joint pain.  You have a headache or neck pain.  You have unusual weakness.  You have a rash.  You have chest pain or shortness of breath.  You have abdominal pain, nausea, or vomiting.  You feel unusually tired or sleepy. MAKE SURE YOU:   Understand these instructions.  Will watch your condition.  Will get help right away if you are not doing well or get worse. Document Released: 03/06/2004 Document Revised: 04/21/2011 Document Reviewed: 08/28/2010 [facility] Patient Information 2014 [place].  Abdominal Pain Abdominal pain can be caused by many things. Your caregiver decides the seriousness of your pain by an examination and possibly blood tests and X-rays. Many cases can be observed and treated at home. Most abdominal pain is not caused by a disease and will probably improve without treatment. However, in many cases, more time must pass before a clear cause of the pain can be found. Before that point, it may not be known if you need more testing, or if hospitalization or surgery is needed. HOME CARE INSTRUCTIONS   Do not take laxatives unless directed by your caregiver.  Take pain medicine only as directed by your caregiver.  Only take over-the-counter or prescription medicines for pain, discomfort, or fever as directed by your caregiver.  Try a clear liquid diet (broth, tea, or water) for as long as directed by your caregiver. Slowly move to a bland diet as tolerated. SEEK IMMEDIATE MEDICAL CARE IF:   The pain does not go away.  You have a fever.  You keep throwing up (vomiting).  The pain is felt only in portions of the abdomen. Pain in the right side could possibly be appendicitis. In an adult, pain in the left lower portion of the abdomen could be colitis or diverticulitis.  You pass bloody or black tarry stools. MAKE SURE YOU:   Understand these instructions.  Will watch your condition.  Will get help right away  if you are not doing well or get worse. Document Released: 11/06/2004 Document Revised: 04/21/2011 Document Reviewed: 09/15/2007 [facility] Patient Information 2014 [place].

## 2012-08-24 NOTE — Assessment & Plan Note (Signed)
Unclear etiology Ddx ?metastatic disease from renal cell cancer but per patient she has been in remission since Interventional Radiology procedure; pancreatitis (less likely no history of gallstones), SBO (less likely patient having bowel movements), splenic infarction, mesenteric infarction  Associated with nausea but patient declines medication for nausea today.  She tolerated food for the past 2 days but has not eaten today   Will check CMET, CBC, lipase, UA, lactic acid, EKG today.  Ordered CT w/ contrast abdomen pelvis 08/25/12   Patient to schedule f/u with [name] in the near future.

## 2012-08-24 NOTE — Progress Notes (Signed)
  Subjective:    Patient ID: [name], female    DOB: 08-12-1950, 62 y.o.   MRN: [id number]  HPI Comments: 62 y.o significant PMH (i.e DM HA1C 8.2%) presents for left upper quadrant pain which started Sunday prior to visit.  Pain is new.  Pain is getting better each day since onset.  She cant describe the sensation but states pain is annoying and she does not give a number when asked to rate on scale of 10. Pain is worse with breathing, laughing, yawning and is constant.  Narcotic medication does not relieve.  Last bowel movement was yesterday.  Pain is localized.  She denies h/o pancreatitis but has had a h/o renal cancer on the left and needs to schedule follow up with [name] in the near future.  She has nausea but denies vomiting, fever, chills, hematuria, dysuria, chest pain.  She also has right forearm itchy red lesion ?bite.  BP is 97/56 temp 97.7 F, 93% on 1 L [place].  She reports leg swelling is better.    ROS per HPI      Review of Systems     Objective:   Physical Exam  Nursing note and vitals reviewed. Constitutional: She is oriented to person, place, and time. Vital signs are normal. She appears well-developed and well-nourished. She is cooperative. No distress.  HENT:  Head: Normocephalic and atraumatic.  Mouth/Throat: Abnormal dentition. No oropharyngeal exudate.  No teeth  Eyes: Conjunctivae are normal. Pupils are equal, round, and reactive to light. Right eye exhibits no discharge. Left eye exhibits no discharge. No scleral icterus.  Cardiovascular: Normal rate, regular rhythm, S1 normal, S2 normal and normal heart sounds.   No murmur heard. Pulmonary/Chest: Effort normal and breath sounds normal. No respiratory distress. She has no wheezes.  Abdominal: Soft. She exhibits no distension. Bowel sounds are increased. There is tenderness in the left upper quadrant. There is no rebound and no guarding.    Neurological: She is alert and oriented to person, place, and  time.  In wheelchair  Skin: Skin is warm and dry. No rash noted. She is not diaphoretic.     Psychiatric: She has a normal mood and affect. Her speech is normal and behavior is normal. Judgment and thought content normal. Cognition and memory are normal.  Good sense of humor          Assessment & Plan:  Follow up as scheduled with PCP, sooner if worsening Insect bite to right forearm. Does not appear infected gave patient Bacitracin samples to use. Advised if itching she can try OTC HC cream or benadryl

## 2012-08-25 ENCOUNTER — Ambulatory Visit (HOSPITAL_COMMUNITY)
Admission: RE | Admit: 2012-08-25 | Discharge: 2012-08-25 | Disposition: A | Discharge status: Home / Self Care | Payer: Medicare Other | Source: Ambulatory Visit | Attending: Internal Medicine | Admitting: Internal Medicine

## 2012-08-25 ENCOUNTER — Other Ambulatory Visit (HOSPITAL_COMMUNITY): Payer: Medicare Other

## 2012-08-25 ENCOUNTER — Telehealth: Payer: Self-pay | Admitting: Internal Medicine

## 2012-08-25 ENCOUNTER — Encounter (HOSPITAL_COMMUNITY): Payer: Self-pay

## 2012-08-25 ENCOUNTER — Other Ambulatory Visit: Payer: Medicare Other

## 2012-08-25 ENCOUNTER — Encounter: Payer: Self-pay | Admitting: Internal Medicine

## 2012-08-25 DIAGNOSIS — R1012 Left upper quadrant pain: Secondary | ICD-10-CM

## 2012-08-25 DIAGNOSIS — D735 Infarction of spleen: Secondary | ICD-10-CM

## 2012-08-25 LAB — URINALYSIS, ROUTINE W REFLEX MICROSCOPIC
Bilirubin Urine: NEGATIVE
Glucose, UA: NEGATIVE mg/dL
Hgb urine dipstick: NEGATIVE
Ketones, ur: NEGATIVE mg/dL
Leukocytes, UA: NEGATIVE
Nitrite: NEGATIVE
Protein, ur: NEGATIVE mg/dL
Specific Gravity, Urine: 1.02 (ref 1.005–1.030)
Urobilinogen, UA: 0.2 mg/dL (ref 0.0–1.0)
pH: 6 (ref 5.0–8.0)

## 2012-08-25 MED ORDER — IOHEXOL 300 MG/ML  SOLN
80.0000 mL | Freq: Once | INTRAMUSCULAR | Status: AC | PRN
  Administered 2012-08-25: 80 mL via INTRAVENOUS

## 2012-08-25 NOTE — Addendum Note (Signed)
Addended by: Cresenciano Genre on: 08/25/2012 09:01 AM   Modules accepted: Orders

## 2012-08-25 NOTE — Telephone Encounter (Signed)
Spoke with patient reminded pt again of urine sample which was supposed to be collected yesterday.  She will give a sample after CT and wants to know if pleuritic pain could be from her lung.  Added on CXR to CT abdomen/pelvis to w/u for etiology of pain.  Unclear at this time.    Aundra Dubin MD

## 2012-08-25 NOTE — Progress Notes (Signed)
I saw and evaluated the patient.  I personally confirmed the key portions of the history and exam documented by Dr. Aundra Dubin and I reviewed pertinent patient test results.  The assessment, diagnosis, and plan were formulated together and I agree with the documentation in the resident's note.

## 2012-08-26 ENCOUNTER — Telehealth: Payer: Self-pay | Admitting: Internal Medicine

## 2012-08-26 DIAGNOSIS — D735 Infarction of spleen: Secondary | ICD-10-CM

## 2012-08-26 NOTE — Telephone Encounter (Signed)
I called Ms. 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  She denies any fevers, shakes, chills, or signs/symptoms suggestive of digital infarction.  She also denies any palpitations or irregular heart beat sensations.  Finally, she denies any worsening in her chronic dyspnea or new LE edema to suggest mitral insufficiency.  A TTE in June revealed a normal functioning bovine mitral valve.  Of the causes of splenic infarction, the most likely cause would be thromboembolism in her.  With her history of atrial fibrillation s/p MAZE procedure and mitral valve stenosis s/p a bovine mitral valve replacement I am most concerned that the source of any embolism to the spleen would be cardiac in nature.  With the above history she does not clinically have paroxysmal atrial fibrillation, although she could be asymptomatic.  Of note, her ECG 2 days ago showed a sinus rhythm.  We will therefore get a Holter monitor to see if we may pick up paroxysmal atrial fibrillation.  We will also repeat the TTE to assess for any changes in the function of the bovine mitral valve and look for any evidence of endocarditis.  This has to be done even though she had a recent TTE given the new splenic infarct in the interim and the most likely etiology being thromboembolism.  Further evaluation or therapy should be dictated by this initial work-up.

## 2012-08-27 NOTE — Telephone Encounter (Signed)
Received call from [place] with [facility] [phone]) asking about orders placed for 2d echo and Holter monitor.  Appointment has been scheduled for Wed July 24th for both Echo and Holter monitor.  Pt was contacted with appointment, but wanted me to inform Dr.Klima that she failed to tell him she was having to use her rescue inhaler at night for the last two weeks, which is new.  Will forward info to MD for review.Marland Kitchen

## 2012-08-27 NOTE — Telephone Encounter (Signed)
Thank you for the information.  [name] stated that her dyspnea was at baseline when we spoke last evening.  She did not mention the need to use her rescue inhaler at night.  I suspect this is during the time she had the splenic infarct and may have been from splinting on the left.  With her dyspnea otherwise at baseline we will continue to follow clinically on the current regimen and see if there is an improvement as the pain associated with the splenic infarct resolves.

## 2012-08-30 ENCOUNTER — Other Ambulatory Visit (INDEPENDENT_AMBULATORY_CARE_PROVIDER_SITE_OTHER): Payer: Medicare Other

## 2012-08-30 DIAGNOSIS — Z48812 Encounter for surgical aftercare following surgery on the circulatory system: Secondary | ICD-10-CM

## 2012-08-30 DIAGNOSIS — I6529 Occlusion and stenosis of unspecified carotid artery: Secondary | ICD-10-CM

## 2012-09-01 ENCOUNTER — Ambulatory Visit
Admission: RE | Admit: 2012-09-01 | Discharge: 2012-09-01 | Disposition: A | Discharge status: Home / Self Care | Payer: Medicare Other | Source: Ambulatory Visit | Attending: Interventional Radiology | Admitting: Interventional Radiology

## 2012-09-01 ENCOUNTER — Encounter: Payer: Self-pay | Admitting: Internal Medicine

## 2012-09-01 VITALS — BP 81/42 | HR 74 | Temp 98.3°F | Resp 22 | Ht 62.0 in | Wt 178.0 lb

## 2012-09-01 DIAGNOSIS — N2889 Other specified disorders of kidney and ureter: Secondary | ICD-10-CM

## 2012-09-01 NOTE — Progress Notes (Signed)
Denies hematuria or any other urinary problems associated with cryoablation.    States that her Lasix was recently increased due to edema of lower extremites.  Has experienced urinary frequency associated with diuretic.  [name], [place] 09/01/2012 9:38 AM

## 2012-09-01 NOTE — Progress Notes (Signed)
Patient ID: [name], female   DOB: Jan 12, 1951, 62 y.o.   MRN: [id number]  ESTABLISHED PATIENT OFFICE VISIT  Chief Complaint: Status post percutaneous cryoablation of a left renal cell carcinoma on 09/19/2011.  History: Ms. Widmann returns for follow-up. She recently experienced acute onset of left upper quadrant abdominal pain which was worked up with a CT of the abdomen on 08/25/2012 demonstrating an acute splenic infarct affecting approximately 20% of the splenic volume. She is now been worked up by Dr. Eppie Gibson for possible embolic source including cardiac monitoring due to her history of atrial fibrillation. She also has a follow up appointment with Dr. Stanford Breed soon.  Review of Systems: No fever or chills. No flank pain, hematuria or dysuria. Left upper quadrant abdominal pain has subsided significantly. The patient denies pain today.  Exam: Vital signs: Blood pressure 98/51, pulse 77 and regular. Respirations 22. Temperature 98.3. Oxygen saturation 94% on 1 liter of oxygen. General: No acute distress. Abdomen: Soft and nontender. No flank tenderness.  Labs: BUN 32, creatinine 1.27 and estimated GFR 45 ml/minute on 08/03/2012.  Imaging: I reviewed the recent CT scan with the patient. This shows further retraction of the ablation defect in the lower pole of the left kidney with no evidence of abnormal enhancement to suggest carcinoma recurrence.  Assessment and Plan: No evidence of left renal carcinoma recurrence or complication following percutaneous cryoablation 1 year ago. I have recommended another follow-up CT scan in 1 year. The patient appears to be improving symptomatically after a recent splenic infarction and is being worked up for possible embolic source.

## 2012-09-02 ENCOUNTER — Ambulatory Visit (HOSPITAL_COMMUNITY): Payer: Medicare Other | Attending: Cardiovascular Disease

## 2012-09-02 ENCOUNTER — Encounter (INDEPENDENT_AMBULATORY_CARE_PROVIDER_SITE_OTHER): Payer: Medicare Other

## 2012-09-02 ENCOUNTER — Telehealth: Payer: Self-pay | Admitting: *Deleted

## 2012-09-02 DIAGNOSIS — I4891 Unspecified atrial fibrillation: Secondary | ICD-10-CM

## 2012-09-02 DIAGNOSIS — J4489 Other specified chronic obstructive pulmonary disease: Secondary | ICD-10-CM | POA: Insufficient documentation

## 2012-09-02 DIAGNOSIS — E785 Hyperlipidemia, unspecified: Secondary | ICD-10-CM | POA: Insufficient documentation

## 2012-09-02 DIAGNOSIS — I079 Rheumatic tricuspid valve disease, unspecified: Secondary | ICD-10-CM | POA: Insufficient documentation

## 2012-09-02 DIAGNOSIS — E119 Type 2 diabetes mellitus without complications: Secondary | ICD-10-CM | POA: Insufficient documentation

## 2012-09-02 DIAGNOSIS — D735 Infarction of spleen: Secondary | ICD-10-CM

## 2012-09-02 DIAGNOSIS — J449 Chronic obstructive pulmonary disease, unspecified: Secondary | ICD-10-CM | POA: Insufficient documentation

## 2012-09-02 DIAGNOSIS — I6529 Occlusion and stenosis of unspecified carotid artery: Secondary | ICD-10-CM | POA: Insufficient documentation

## 2012-09-02 DIAGNOSIS — I059 Rheumatic mitral valve disease, unspecified: Secondary | ICD-10-CM

## 2012-09-02 DIAGNOSIS — I872 Venous insufficiency (chronic) (peripheral): Secondary | ICD-10-CM | POA: Insufficient documentation

## 2012-09-02 DIAGNOSIS — I379 Nonrheumatic pulmonary valve disorder, unspecified: Secondary | ICD-10-CM | POA: Insufficient documentation

## 2012-09-02 DIAGNOSIS — D7389 Other diseases of spleen: Secondary | ICD-10-CM | POA: Insufficient documentation

## 2012-09-02 DIAGNOSIS — E669 Obesity, unspecified: Secondary | ICD-10-CM | POA: Insufficient documentation

## 2012-09-02 DIAGNOSIS — I10 Essential (primary) hypertension: Secondary | ICD-10-CM | POA: Insufficient documentation

## 2012-09-02 DIAGNOSIS — I2789 Other specified pulmonary heart diseases: Secondary | ICD-10-CM | POA: Insufficient documentation

## 2012-09-02 NOTE — Telephone Encounter (Signed)
[place] monitor placed on Pt 09/02/12 TK

## 2012-09-02 NOTE — Progress Notes (Signed)
Echocardiogram performed.  

## 2012-09-08 ENCOUNTER — Other Ambulatory Visit: Payer: Self-pay | Admitting: Dietician

## 2012-09-08 DIAGNOSIS — E119 Type 2 diabetes mellitus without complications: Secondary | ICD-10-CM

## 2012-09-08 DIAGNOSIS — E1140 Type 2 diabetes mellitus with diabetic neuropathy, unspecified: Secondary | ICD-10-CM

## 2012-09-08 MED ORDER — INSULIN DETEMIR 100 UNIT/ML ~~LOC~~ SOLN: 36.0000 [IU] | Freq: Two times a day (BID) | SUBCUTANEOUS | Status: DC

## 2012-09-08 NOTE — Telephone Encounter (Signed)
Does not need refills of Levemir right now because she has some extra to use, but is questioning why she only got 1 vail this month instead of 2. Needs a little more than 2 vials per 30 days to inject 36 units twice a day as prescribed. Requests Rx be corrected so that the next time she gets refills she gets the correct number of vials

## 2012-09-13 ENCOUNTER — Other Ambulatory Visit: Payer: Self-pay | Admitting: *Deleted

## 2012-09-17 ENCOUNTER — Other Ambulatory Visit: Payer: Self-pay | Admitting: Internal Medicine

## 2012-09-17 NOTE — Telephone Encounter (Signed)
Refilled in June with refills so just needs to call pharmacy

## 2012-09-21 ENCOUNTER — Telehealth: Payer: Self-pay | Admitting: Cardiology

## 2012-09-21 ENCOUNTER — Encounter: Payer: Self-pay | Admitting: Surgery

## 2012-09-21 NOTE — Telephone Encounter (Signed)
Follow Up     Pt calling following up on test results.

## 2012-09-21 NOTE — Telephone Encounter (Signed)
Returned call to patient she stated she was calling to get results of echo and 48 hr holter monitor.Message sent to Dr.Crenshaw's nurse.

## 2012-09-22 ENCOUNTER — Other Ambulatory Visit: Payer: Self-pay | Admitting: Internal Medicine

## 2012-09-23 NOTE — Telephone Encounter (Signed)
Spoke with pt, aware of echo results , also aware we have the monitor to review but dr Stanford Breed is out this week and he will be able to give her the results at her appt Monday. The pt is concerned because she has niot heard from [facility] regarding her carotid results. She has recently had a splenic infarct and there is a new finding for thrombus on the carotid. I left a message for the triage nurse at [facility] to please give the pt a call about those results. Pt agreed with this plan.

## 2012-09-27 ENCOUNTER — Encounter: Payer: Self-pay | Admitting: Cardiology

## 2012-09-27 ENCOUNTER — Ambulatory Visit (INDEPENDENT_AMBULATORY_CARE_PROVIDER_SITE_OTHER): Payer: Medicare Other | Admitting: Cardiology

## 2012-09-27 ENCOUNTER — Other Ambulatory Visit: Payer: Self-pay | Admitting: Dietician

## 2012-09-27 ENCOUNTER — Telehealth: Payer: Self-pay | Admitting: Cardiology

## 2012-09-27 ENCOUNTER — Encounter: Payer: Self-pay | Admitting: *Deleted

## 2012-09-27 VITALS — BP 120/54 | HR 86 | Ht 62.0 in | Wt 190.8 lb

## 2012-09-27 DIAGNOSIS — I4891 Unspecified atrial fibrillation: Secondary | ICD-10-CM

## 2012-09-27 DIAGNOSIS — I5032 Chronic diastolic (congestive) heart failure: Secondary | ICD-10-CM

## 2012-09-27 DIAGNOSIS — E785 Hyperlipidemia, unspecified: Secondary | ICD-10-CM

## 2012-09-27 DIAGNOSIS — Z720 Tobacco use: Secondary | ICD-10-CM

## 2012-09-27 DIAGNOSIS — I050 Rheumatic mitral stenosis: Secondary | ICD-10-CM

## 2012-09-27 DIAGNOSIS — I272 Pulmonary hypertension, unspecified: Secondary | ICD-10-CM

## 2012-09-27 DIAGNOSIS — Z8679 Personal history of other diseases of the circulatory system: Secondary | ICD-10-CM

## 2012-09-27 DIAGNOSIS — E1140 Type 2 diabetes mellitus with diabetic neuropathy, unspecified: Secondary | ICD-10-CM

## 2012-09-27 DIAGNOSIS — F17200 Nicotine dependence, unspecified, uncomplicated: Secondary | ICD-10-CM

## 2012-09-27 DIAGNOSIS — I2789 Other specified pulmonary heart diseases: Secondary | ICD-10-CM

## 2012-09-27 MED ORDER — WARFARIN SODIUM 5 MG PO TABS: 5.0000 mg | ORAL_TABLET | Freq: Every day | ORAL | Status: DC

## 2012-09-27 NOTE — Assessment & Plan Note (Signed)
Status post mitral valve replacement. Continue SBE prophylaxis.

## 2012-09-27 NOTE — Assessment & Plan Note (Signed)
Patient counseled on discontinuing. 

## 2012-09-27 NOTE — Assessment & Plan Note (Signed)
Continue present dose of diuretic.

## 2012-09-27 NOTE — Assessment & Plan Note (Signed)
>>  ASSESSMENT AND PLAN FOR CHRONIC CHF (CONGESTIVE HEART FAILURE) (HCC) WRITTEN ON 09/27/2012 12:00 PM BY CRENSHAW, Madolyn Frieze, MD  Continue present dose of diuretic.

## 2012-09-27 NOTE — Assessment & Plan Note (Signed)
Most likely from previous mitral stenosis and COPD.

## 2012-09-27 NOTE — Assessment & Plan Note (Signed)
Continue statin. 

## 2012-09-27 NOTE — Assessment & Plan Note (Signed)
Patient has a history of atrial fibrillation. She is in sinus rhythm on most recent electrocardiogram and on exam today. We discontinued her Coumadin previously because of recurrent falls. However she recently developed left upper quadrant pain and CT showed splenic infarct. I am concerned this may have been an embolic event from atrial fibrillation. I have also considered a vegetation on her mitral valve but she has not had fevers or chills. We had long discussion today again concerning risks and benefits of Coumadin. Given the potential embolic event I have elected to discontinue aspirin and resume Coumadin. We will begin 5 mg daily for 3 days and then alternate with 2.5 mg. Check INR in 4 days. Goal 2-3. If she has additional issues in the future we will consider a TEE to more fully assess mitral valve. However she clearly would not be a candidate for redo mitral valve replacement given COPD, hypertension and overall medical condition. Her previous course was rocky following initial mitral valve replacement.

## 2012-09-27 NOTE — Patient Instructions (Addendum)
Your physician recommends that you schedule a follow-up appointment in: 8-12 WEEKS WITH DR CRENSHAW  STOP ASPIRIN  START WARFARIN SODIUM 5 MG ONCE DAILY FOR THREE DAYS THEN ALTERNATE 2.5 MG AND 5 MG EVERY OTHER DAY  APPOINTMENT WITH [facility] Thursday THIS WEEK

## 2012-09-27 NOTE — Assessment & Plan Note (Signed)
Continue statin. Followed by vascular surgery.

## 2012-09-27 NOTE — Telephone Encounter (Signed)
Needs rxs for testing supplies.

## 2012-09-27 NOTE — Telephone Encounter (Signed)
New problem   [name] is returning your call from last week.

## 2012-09-27 NOTE — Telephone Encounter (Addendum)
Spoke with carolyn, they have sent a letter to the pt regarding her test. It was mailed on the 12 th. Note sent to pt via mychart

## 2012-09-27 NOTE — Progress Notes (Signed)
HPI: Pleasant female for fu of MVR. Carotid Dopplers in May 2012 showed an 80-99% right and a 60-79% left stenosis. Patient had right carotid endarterectomy and is now followed by vascular surgery. Patient had previous workup for mitral stenosis. Cardiac catheterization in August of 2012 showed normal LV function, normal coronary arteries, severe mitral stenosis, severely elevated pulmonary capillary wedge pressure and severe pulmonary hypertension. The patient had mitral valve replacement with a 27-mm pericardial porcine valve and left atrial maze procedure for paroxysmal atrial fibrillation on September 21, 2010. She developed atrial fibrillation postoperatively and had been treated with amiodarone. Patient had elective DCCV in Oct 2012. Last echocardiogram in July of 2014 showed normal LV function, prosthetic mitral valve with no significant mitral regurgitation, severe left atrial enlargement. Holter monitor in July of 2014 showed sinus rhythm with PACs and PVCs. Patient seen by primary care in July of 2014 for left upper quadrant pain. CT scan showed splenic infarct. Since she was last seen, she continues to have dyspnea on exertion. No orthopnea or PND. Occasional pedal edema. No chest pain or syncope. No fevers or chills.   Current Outpatient Prescriptions  Medication Sig Dispense Refill  . albuterol (PROVENTIL HFA;VENTOLIN HFA) 108 (90 BASE) MCG/ACT inhaler Inhale 2 puffs into the lungs every 6 (six) hours as needed. For wheeze or shortness of breath  3 Inhaler  3  . alendronate (FOSAMAX) 70 MG tablet Take 70 mg by mouth every 7 (seven) days. Takes on wednesdays      . ALPRAZolam (XANAX) 1 MG tablet 0.5 mg by mouth up to twice daily as needed for anxiety. AND 25m at bedtime for sleep  60 tablet  5  . aspirin EC 81 MG tablet Take 81 mg by mouth every evening.      . Calcium Carb-Cholecalciferol (CALCIUM 600/VITAMIN D3) 600-800 MG-UNIT TABS Take 2 tablets by mouth at bedtime.  180 tablet  3  .  capsaicin (ZOSTRIX) 0.025 % cream Apply topically 3 (three) times daily. Apply to affected areas 3-4 times daily.  60 g  0  . cetirizine (ZYRTEC ALLERGY) 10 MG tablet Take 1 tablet (10 mg total) by mouth daily. FOR ALLERGIES  90 tablet  1  . docusate sodium (COLACE) 50 MG capsule Take 100 mg by mouth every morning. Can Alternate with  senokot      . ferrous gluconate (FERGON) 324 MG tablet Take 1 tablet (324 mg total) by mouth 2 (two) times daily with a meal.  120 tablet  3  . fluticasone (FLONASE) 50 MCG/ACT nasal spray USE 2 SPRAYS INTO NOSE DAILY FOR NASAL CONGESTION  16 g  5  . Fluticasone-Salmeterol (ADVAIR DISKUS) 250-50 MCG/DOSE AEPB Inhale 1 puff into the lungs every 12 (twelve) hours.  3 each  3  . furosemide (LASIX) 80 MG tablet Taking 120 qam and 80 qhs      . HYDROcodone-acetaminophen (NORCO/VICODIN) 5-325 MG per tablet Take 1-2 tablets by mouth every 6 (six) hours as needed for pain.  180 tablet  5  . insulin aspart (NOVOLOG FLEXPEN) 100 UNIT/ML injection Take 18 units with meals and also use SSI with meals in addition to the 18 units of insulin  15 pen  3  . insulin detemir (LEVEMIR) 100 UNIT/ML injection Inject 0.36 mLs (36 Units total) into the skin 2 (two) times daily.  30 mL  11  . Insulin Pen Needle (B-D UF III MINI PEN NEEDLES) 31G X 5 MM MISC DX: 250.02 Please check blood sugars  3 times daily.  100 each  11  . lisinopril (PRINIVIL,ZESTRIL) 10 MG tablet Take 1 tablet (10 mg total) by mouth daily.  90 tablet  3  . omeprazole (PRILOSEC) 40 MG capsule Take 1 capsule (40 mg total) by mouth 2 (two) times daily.  180 capsule  3  . petrolatum-hydrophilic-aloe vera (ALOE VESTA) ointment Apply topically 2 (two) times daily as needed for wound care.  226 g  11  . potassium chloride SA (K-DUR,KLOR-CON) 20 MEQ tablet Take 2 tablets (40 mEq total) by mouth daily.  180 tablet  3  . rosuvastatin (CRESTOR) 20 MG tablet Take 1 tablet (20 mg total) by mouth daily with supper.  30 tablet  5  .  sertraline (ZOLOFT) 100 MG tablet Take 1 tablet (100 mg total) by mouth daily.  30 tablet  2  . Spacer/Aero-Holding Chambers (AEROCHAMBER MV) inhaler Use as instructed with inhalers.  1 each  0  . tiotropium (SPIRIVA) 18 MCG inhalation capsule Place 1 capsule (18 mcg total) into inhaler and inhale daily with breakfast.  90 capsule  0  . gabapentin (NEURONTIN) 300 MG capsule Take 300 mg by mouth 2 (two) times daily.      . Insulin Syringe-Needle U-100 (INSULIN SYRINGE .3CC/31GX5/16") 31G X 5/16" 0.3 ML MISC 1 Syringe by Does not apply route 2 (two) times daily at 10 AM and 5 PM.  100 each  5  . nystatin cream (MYCOSTATIN) Apply topically 2 (two) times daily.  30 g  11   No current facility-administered medications for this visit.     Past Medical History  Diagnosis Date  . COPD (chronic obstructive pulmonary disease) with emphysema     PFTs 02/2012: FEV1 0.92 (40%), ratio 69, 27% increase in FEV1 with BD, TLC 91%, severe airtrapping, DLCO49% On chronic home O2. Pulmonary rehab referral 05/2012   . Moderate to severe pulmonary hypertension     2014 TEE w PA peak pressure 46 mmHg, s/p MV replacement   . Tobacco abuse 07/28/2012  . Type 2 diabetes mellitus with diabetic neuropathy   . Hyperlipidemia LDL goal < 100 11/20/2005  . Carotid artery stenosis     s/p right endarterectomy (06/2010) Carotid US (07/2010):  Left: Moderate-to-severe (60-79%) calcific and non-calcific plaque origin and proximal ICA and ECA   . Mitral stenosis     s/p Mitral valve replacement with a 27-mm pericardial porcine valve (Medtronic Mosaic valve, serial #[id number] on 09/20/10, Dr. Prescott Gum)   . Chronic congestive heart failure with left ventricular diastolic dysfunction 8/33/8250  . Atrial fibrillation, currently in sinus rhythm 10/22/2010    s/p Left atrial maze procedure for paroxysmal atrial fibrillation on 09/20/2010 by Dr Prescott Gum   . Depression 11/19/2005  . Anxiety 07/24/2010  . Fibromyalgia 08/29/2010  .  Obstructive sleep apnea     Nocturnal polysomnography (06/2009): Moderate sleep apnea/ hypopnea syndrome , AHI 17.8 per hour with nonpositional hypopneas. CPAP titration to 12 CWP, AHI 2.4 per hour. On nocturnal CPAP via a small resMed Quattro full-face mask with heated humidifier.   . Obesity, Class II, BMI 35-39.9 10/23/2011  . Clear cell renal cell carcinoma 07/21/2011    s/p cryoablation of left RCC in 09/2011 by [name]. Followed by [name]  [facility]) .    Marland Kitchen Adenomatous polyps 05/14/2011    Colonoscopy (05/2011): 4 mm adenomatous polyp excised endoscopically Colonoscopy (02/2002): Adenomatous polyp excised endoscopically   . Gastroesophageal reflux disease   . Internal hemorrhoids 08/04/2012  .  Osteoporosis     DEXA (12/09/2011): L-spine T -3.7, left hip T -1.4 DEXA (12/2004): L-spine T -2.6, left hip -0.1   . Allergic rhinitis 06/01/2012  . Bilateral cataracts 08/04/2012    Visually insignificant   . Chronic constipation 02/03/2011  . Chronic venous insufficiency 08/04/2012    Past Surgical History  Procedure Laterality Date  . Orif clavicle fracture  01/2004    by Thana Farr. [name], M.D for Right clavicle nonunion.  . Tonsillectomy    . Carotid endarterectomy  07/04/2010    right, by [name] for asymptomatic right carotid artery stenosis  . Lipoma exision  08/2005    occipital lipoma 1.5cm - by Dr. [name]  . Hysteroscopy with endometrial ablation  06/2001    for persistent post-menopausal bleeding // by S. [name], M.D.  . Mitral valve replacement  09/20/10     with a 27-mm pericardial porcine valve (Medtronic Mosaic valve, serial #[id number]). 09/20/10, Dr Prescott Gum  . Left atrial maze procedure  09/20/10    for paroxysmal atrial fibrillation (Dr. Prescott Gum)  . Placement of left chest tube.  09/24/2010    Dr Prescott Gum  . Cardiac valve replacement  Aug. 2012  . Colonoscopy  05/12/2011    performed by Dr. Michail Sermon. Showing small internal hemorrhoids, single tubular  adenoma polyp  . Esophagogastroduodenoscopy  05/12/2011    performed by Dr. Michail Sermon. Negative for ulcerations, biopsy negative for evidence of celiac sprue  . Tubal ligation    . Eye surgery    . Fracture surgery Right     clavicle    History   Social History  . Marital Status: Divorced    Spouse Name: N/A    Number of Children: N/A  . Years of Education: N/A   Occupational History  . Not on file.   Social History Main Topics  . Smoking status: Current Every Day Smoker -- 1.00 packs/day for 50 years    Types: Cigarettes    Last Attempt to Quit: 07/12/2010  . Smokeless tobacco: Never Used  . Alcohol Use: No  . Drug Use: No  . Sexual Activity: No   Other Topics Concern  . Not on file   Social History Narrative   Lives alone in [place] [facility]) with 60 pound chow   Worked at [employer] for 18 years   No car    ROS: no fevers or chills, positive productive cough; no hemoptysis, dysphasia, odynophagia, melena, hematochezia, dysuria, hematuria, rash, seizure activity, orthopnea, PND, claudication. Remaining systems are negative.  Physical Exam: Well-developed obese in no acute distress.  Skin is warm and dry.  HEENT is normal.  Neck is supple.  Chest is clear to auscultation with normal expansion.  Cardiovascular exam is regular rate and rhythm.  Abdominal exam nontender or distended. No masses palpated. Extremities show no edema. neuro grossly intact  ECG 08/24/2012-sinus rhythm, first degree AV block, RV conduction delay.

## 2012-09-28 MED ORDER — GLUCOSE BLOOD VI STRP: ORAL_STRIP | Status: DC

## 2012-09-28 MED ORDER — ACCU-CHEK FASTCLIX LANCETS MISC: 1.0000 | Freq: Three times a day (TID) | Status: DC

## 2012-09-30 ENCOUNTER — Ambulatory Visit (INDEPENDENT_AMBULATORY_CARE_PROVIDER_SITE_OTHER): Payer: Medicare Other | Admitting: *Deleted

## 2012-09-30 DIAGNOSIS — Z8679 Personal history of other diseases of the circulatory system: Secondary | ICD-10-CM

## 2012-09-30 DIAGNOSIS — D735 Infarction of spleen: Secondary | ICD-10-CM

## 2012-09-30 DIAGNOSIS — I4891 Unspecified atrial fibrillation: Secondary | ICD-10-CM

## 2012-09-30 DIAGNOSIS — Z7901 Long term (current) use of anticoagulants: Secondary | ICD-10-CM | POA: Insufficient documentation

## 2012-09-30 DIAGNOSIS — D7389 Other diseases of spleen: Secondary | ICD-10-CM

## 2012-09-30 LAB — POCT INR: INR: 1

## 2012-10-05 ENCOUNTER — Encounter: Payer: Self-pay | Admitting: Pulmonary Disease

## 2012-10-05 ENCOUNTER — Ambulatory Visit (INDEPENDENT_AMBULATORY_CARE_PROVIDER_SITE_OTHER): Payer: Medicare Other | Admitting: Pulmonary Disease

## 2012-10-05 VITALS — BP 110/60 | HR 79 | Temp 97.0°F | Ht 62.0 in | Wt 191.6 lb

## 2012-10-05 DIAGNOSIS — F17200 Nicotine dependence, unspecified, uncomplicated: Secondary | ICD-10-CM

## 2012-10-05 DIAGNOSIS — J439 Emphysema, unspecified: Secondary | ICD-10-CM

## 2012-10-05 DIAGNOSIS — Z720 Tobacco use: Secondary | ICD-10-CM

## 2012-10-05 DIAGNOSIS — J438 Other emphysema: Secondary | ICD-10-CM

## 2012-10-05 NOTE — Patient Instructions (Addendum)
Can try chlorpheniramine 62m OTC, and take 2 at bedtime in the place of zyrtec to see if helps postnasal drip. Stop smoking.  This is your most important treatment.  No change in breathing medications. Work on weight loss, and continue in rehab. followup with me in [id number] doing well.

## 2012-10-05 NOTE — Assessment & Plan Note (Signed)
The patient is standing on her current bronchodilator regimen, but unfortunately he has started smoking again.  This has led to increased mucous production and cough.  I have told her that she is on good breathing medications, but she cannot expect to ever improve if she continues to smoke.  I have reviewed various techniques for smoking cessation.  I have also encouraged her to work aggressively on weight loss, and continue participation in rehabilitation programs.

## 2012-10-05 NOTE — Progress Notes (Signed)
  Subjective:    Patient ID: [name], female    DOB: 1950-05-01, 62 y.o.   MRN: [id number]  HPI Patient comes in today for followup of her known COPD.  Unfortunately, she has returned to smoking since her last visit, and has noticed increasing cough with white foamy mucus production.  She feels that her breathing is a little worse since doing this, but she has stayed compliant on her bronchodilator regimen.  Her primary physician as they're working on her volume status with changes in her diuretic dosing.  The patient complains that she has had increasing postnasal drip despite taking Zyrtec and a nasal corticosteroid.  Denies any purulence however.   Review of Systems  Constitutional: Negative for fever and unexpected weight change.  HENT: Positive for congestion, rhinorrhea, postnasal drip and sinus pressure. Negative for ear pain, nosebleeds, sore throat, sneezing, trouble swallowing and dental problem.   Eyes: Negative for redness and itching.  Respiratory: Positive for cough, chest tightness and shortness of breath. Negative for wheezing.   Cardiovascular: Negative for palpitations and leg swelling.  Gastrointestinal: Negative for nausea and vomiting.  Genitourinary: Negative for dysuria.  Musculoskeletal: Negative for joint swelling.  Skin: Negative for rash.  Neurological: Positive for headaches.  Hematological: Does not bruise/bleed easily.  Psychiatric/Behavioral: Negative for dysphoric mood. The patient is not nervous/anxious.        Objective:   Physical Exam Obese female in no acute distress Nose without purulence or discharge noted Neck without lymphadenopathy or thyromegaly Chest with decreased breath sounds, no wheezes or crackles Cardiac exam with regular rate and rhythm Lower extremities with no significant edema, no cyanosis Alert and oriented, moves all 4 extremities.       Assessment & Plan:

## 2012-10-06 ENCOUNTER — Ambulatory Visit (INDEPENDENT_AMBULATORY_CARE_PROVIDER_SITE_OTHER): Payer: Medicare Other | Admitting: Internal Medicine

## 2012-10-06 ENCOUNTER — Encounter: Payer: Self-pay | Admitting: Internal Medicine

## 2012-10-06 VITALS — BP 113/57 | HR 77 | Temp 97.8°F | Wt 191.5 lb

## 2012-10-06 DIAGNOSIS — G909 Disorder of the autonomic nervous system, unspecified: Secondary | ICD-10-CM

## 2012-10-06 DIAGNOSIS — M545 Low back pain, unspecified: Secondary | ICD-10-CM

## 2012-10-06 DIAGNOSIS — M797 Fibromyalgia: Secondary | ICD-10-CM

## 2012-10-06 DIAGNOSIS — G8929 Other chronic pain: Secondary | ICD-10-CM

## 2012-10-06 DIAGNOSIS — I4891 Unspecified atrial fibrillation: Secondary | ICD-10-CM

## 2012-10-06 DIAGNOSIS — Z23 Encounter for immunization: Secondary | ICD-10-CM

## 2012-10-06 DIAGNOSIS — IMO0001 Reserved for inherently not codable concepts without codable children: Secondary | ICD-10-CM

## 2012-10-06 DIAGNOSIS — J309 Allergic rhinitis, unspecified: Secondary | ICD-10-CM

## 2012-10-06 DIAGNOSIS — Z8679 Personal history of other diseases of the circulatory system: Secondary | ICD-10-CM

## 2012-10-06 DIAGNOSIS — E1149 Type 2 diabetes mellitus with other diabetic neurological complication: Secondary | ICD-10-CM

## 2012-10-06 DIAGNOSIS — E1143 Type 2 diabetes mellitus with diabetic autonomic (poly)neuropathy: Secondary | ICD-10-CM

## 2012-10-06 HISTORY — DX: Low back pain, unspecified: M54.50

## 2012-10-06 HISTORY — DX: Other chronic pain: G89.29

## 2012-10-06 LAB — GLUCOSE, CAPILLARY: Glucose-Capillary: 186 mg/dL — ABNORMAL HIGH (ref 70–99)

## 2012-10-06 MED ORDER — CHLORPHENIRAMINE MALEATE 4 MG PO TABS: 4.0000 mg | ORAL_TABLET | Freq: Three times a day (TID) | ORAL | Status: DC | PRN

## 2012-10-06 MED ORDER — MORPHINE SULFATE ER 15 MG PO TBCR: 15.0000 mg | EXTENDED_RELEASE_TABLET | Freq: Two times a day (BID) | ORAL | Status: DC

## 2012-10-06 NOTE — Assessment & Plan Note (Signed)
Her biggest complaint today was headaches related to sinus congestion. She was not receiving any relief with Flonase and a second generation antihistamine. It was recommended that she try a first generation antihistamine. She understands that this may be sedating. Recommendation was chlorpheniramine 4 mg by mouth every 8 hours as needed for sinus pressure and congestion. Given that she falls mainly at night it was recommended that she try the first dose of the chlorpheniramine first thing in the morning to see how she would respond to it. If this is partially effective we will consider the addition of pseudoephedrine over a two-week period in hopes of completely draining her sinuses.

## 2012-10-06 NOTE — Patient Instructions (Signed)
It was great to see you again.  1) I am glad you have started the coumadin.  That was going to be the discussion I had with you today.  2) Try the chlorpheniramine for your head pressure.  If it is ineffective or you can not tolerate it we can consider a decongestant.  3) Start the MS Contin 15 mg by mouth twice a day for your back pain.  Take it everyday, even if you have no pain.  4) Keep taking the Vicodin for any "breakthrough" pain.  5) Keep taking your other medications as prescribed.

## 2012-10-06 NOTE — Assessment & Plan Note (Signed)
She was accepting of a flu shot and this was provided for her today.

## 2012-10-06 NOTE — Assessment & Plan Note (Signed)
We also discussed her fibromyalgia and she continues to have more than 11 positive trigger points. She is currently on the gabapentin and this is the best available therapy for her at this time. She did not respond previously to Lyrica and I'm hesitant to give her amitriptyline given the difficulties that she has at night with falls. We decided to concentrate on her chronic low back pain and its management rather than add any further medications for her fibromyalgia, in particular something such as cyclobenzaprine.

## 2012-10-06 NOTE — Progress Notes (Signed)
  Subjective:    Patient ID: [name], female    DOB: [birth year], 62 y.o.   MRN: [id number]  HPI  Please see the A&P for the status of the pt's chronic medical problems.  Review of Systems  Constitutional: Negative for activity change.  HENT: Positive for congestion.   Respiratory: Positive for shortness of breath.   Cardiovascular: Negative for chest pain and leg swelling.  Neurological: Positive for headaches.      Objective:   Physical Exam  Nursing note and vitals reviewed. Constitutional: She is oriented to person, place, and time. She appears well-developed and well-nourished. No distress.  HENT:  Head: Normocephalic and atraumatic.  Eyes: Conjunctivae are normal. Right eye exhibits no discharge. Left eye exhibits no discharge. No scleral icterus.  Neck: Neck supple.  Cardiovascular: Normal rate, regular rhythm and normal heart sounds.  Exam reveals no gallop and no friction rub.   No murmur heard. Pulmonary/Chest: Effort normal. No respiratory distress. She has no wheezes. She has no rales.  Bibasilar inspiratory crackles.  Abdominal: Soft. She exhibits no distension. There is no tenderness. There is no rebound and no guarding.  Musculoskeletal: Normal range of motion. She exhibits no edema and no tenderness.  Neurological: She is alert and oriented to person, place, and time. She exhibits normal muscle tone.  Skin: Skin is warm and dry. No rash noted. She is not diaphoretic. No erythema.  Psychiatric: She has a normal mood and affect. Her behavior is normal. Judgment and thought content normal.      Assessment & Plan:   Please see problem oriented charting.

## 2012-10-06 NOTE — Assessment & Plan Note (Signed)
She notes a history of chronic low back pain that has been particularly uncomfortable recently. The Vicodin will help but only briefly. We discussed the concept of chronic pain and its management. She was willing to start MS Contin 15 mg by mouth twice a day. She knows it will take at least 4-5 days in order to notice any improvement in that it is very likely we will have to increase the dose of the medication over time. She was instructed to continue to take the Vicodin as needed for breakthrough pain.

## 2012-10-06 NOTE — Assessment & Plan Note (Signed)
She recently developed left upper quadrant abdominal pain and was found to have a splenic infarct. The concern was that this may have been a thromboembolic event. Given her history of previous atrial fibrillation that had underwent the Maze procedure it was felt that if she had a thromboembolic event it was likely secondary to paroxysmal atrial fibrillation. A recent echocardiogram failed to reveal a thrombus. Her appointment today was to discuss our concerns for paroxysmal atrial fibrillation and our recommendations to restart anticoagulation with Coumadin. This anticoagulation would likely be lifelong given the concern for paroxysmal atrial fibrillation. She was seen by Cardiology last week who came to the same conclusion and had already started her on the Coumadin. We discussed the reason for the empiric therapy, the risks and the benefits, and the fact that this would likely be lifelong therapy. She understood and was willing to continue with the anticoagulation given our concerns.

## 2012-10-07 ENCOUNTER — Ambulatory Visit (INDEPENDENT_AMBULATORY_CARE_PROVIDER_SITE_OTHER): Payer: Medicare Other

## 2012-10-07 DIAGNOSIS — D735 Infarction of spleen: Secondary | ICD-10-CM

## 2012-10-07 DIAGNOSIS — Z7901 Long term (current) use of anticoagulants: Secondary | ICD-10-CM

## 2012-10-07 DIAGNOSIS — D7389 Other diseases of spleen: Secondary | ICD-10-CM

## 2012-10-07 DIAGNOSIS — I4891 Unspecified atrial fibrillation: Secondary | ICD-10-CM

## 2012-10-07 DIAGNOSIS — I480 Paroxysmal atrial fibrillation: Secondary | ICD-10-CM

## 2012-10-07 LAB — POCT INR: INR: 3.3

## 2012-10-08 ENCOUNTER — Other Ambulatory Visit: Payer: Self-pay | Admitting: *Deleted

## 2012-10-08 ENCOUNTER — Encounter: Payer: Medicare Other | Admitting: Internal Medicine

## 2012-10-08 DIAGNOSIS — I5032 Chronic diastolic (congestive) heart failure: Secondary | ICD-10-CM

## 2012-10-08 DIAGNOSIS — R600 Localized edema: Secondary | ICD-10-CM

## 2012-10-08 DIAGNOSIS — R1012 Left upper quadrant pain: Secondary | ICD-10-CM

## 2012-10-08 MED ORDER — FUROSEMIDE 80 MG PO TABS: ORAL_TABLET | ORAL | Status: DC

## 2012-10-12 ENCOUNTER — Telehealth: Payer: Self-pay | Admitting: Dietician

## 2012-10-12 ENCOUNTER — Ambulatory Visit: Payer: Medicare Other | Attending: Internal Medicine

## 2012-10-12 DIAGNOSIS — R279 Unspecified lack of coordination: Secondary | ICD-10-CM | POA: Insufficient documentation

## 2012-10-12 DIAGNOSIS — R262 Difficulty in walking, not elsewhere classified: Secondary | ICD-10-CM | POA: Insufficient documentation

## 2012-10-12 DIAGNOSIS — IMO0001 Reserved for inherently not codable concepts without codable children: Secondary | ICD-10-CM | POA: Insufficient documentation

## 2012-10-12 DIAGNOSIS — Z9181 History of falling: Secondary | ICD-10-CM | POA: Insufficient documentation

## 2012-10-12 NOTE — Telephone Encounter (Signed)
Tried calling Ms. Cotugno today before 1:45 PM but only received an answering machine.  I will try calling her back with my non emergent question within the next 24-48 hours.

## 2012-10-12 NOTE — Telephone Encounter (Signed)
CMN form for [facility]' Diabetes testing is in Dr. Caroline More box.

## 2012-10-12 NOTE — Telephone Encounter (Signed)
Afraid she is going to run out of test strips because [facility] still needs CMN  form filled out for test strips- they are faxing one today.   Also she is waiting to hear back from Dr. Eppie Gibson. She'll be gone after 1:45 Pm today for neuro rehab and home all day tomorrow.

## 2012-10-14 ENCOUNTER — Ambulatory Visit: Payer: Medicare Other

## 2012-10-15 ENCOUNTER — Telehealth: Payer: Self-pay | Admitting: Internal Medicine

## 2012-10-15 DIAGNOSIS — D735 Infarction of spleen: Secondary | ICD-10-CM

## 2012-10-15 DIAGNOSIS — M545 Low back pain, unspecified: Secondary | ICD-10-CM

## 2012-10-15 DIAGNOSIS — G8929 Other chronic pain: Secondary | ICD-10-CM

## 2012-10-15 DIAGNOSIS — K219 Gastro-esophageal reflux disease without esophagitis: Secondary | ICD-10-CM

## 2012-10-15 MED ORDER — OMEPRAZOLE 40 MG PO CPDR: 40.0000 mg | DELAYED_RELEASE_CAPSULE | Freq: Every day | ORAL | Status: DC

## 2012-10-15 NOTE — Telephone Encounter (Signed)
I called Ms. Pouncey to discuss her PPI dosing.  She is on omeprazole 40 mg PO BID for GERD.  She frequently misses doses at night yet remains asymptomatic.  We will therefore decrease her omeprazole to 40 mg PO QAM.  While on the phone she had 3 complaints:  1) Intermittent numbness in the left pinky and the right leg.  The left pinky numbness may occur when she props her left arm on the table while using the computer.  I asked her to pay attention to this and see if we could pin this down as the cause of the intermittent pinky numbness (ulner nerve compression).  The right leg numbness comes after sitting on the toilet for a while.  Only once was there associated weakness and the symptoms always quickly resolved.  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  We will follow up on these symptoms at the follow-up visit.  2) Continued left flank pain previously demonstrated to be a splenic infarct.  It should have resolved by now.  Will reassess with a LUQ ultrasound to make sure we are not missing some other lesion or see if she has a new infarct.  3) Acute exacerbation of low back pain:  This occurred after starting rehab.  She states it is worse than baseline.  I am concerned she may have a compression fracture because of her osteoporosis.  Will check a lumbar X-ray as she could benefit from calcitonin if she has a fracture.  If the X-ray is negative it could be secondary to the start of physical therapy and may improve with continued physical therapy.

## 2012-10-18 ENCOUNTER — Ambulatory Visit (INDEPENDENT_AMBULATORY_CARE_PROVIDER_SITE_OTHER): Payer: Medicare Other | Admitting: Pharmacist

## 2012-10-18 ENCOUNTER — Ambulatory Visit: Payer: Medicare Other

## 2012-10-18 DIAGNOSIS — Z7901 Long term (current) use of anticoagulants: Secondary | ICD-10-CM

## 2012-10-18 DIAGNOSIS — I4891 Unspecified atrial fibrillation: Secondary | ICD-10-CM

## 2012-10-18 DIAGNOSIS — D7389 Other diseases of spleen: Secondary | ICD-10-CM

## 2012-10-18 DIAGNOSIS — I480 Paroxysmal atrial fibrillation: Secondary | ICD-10-CM

## 2012-10-18 DIAGNOSIS — D735 Infarction of spleen: Secondary | ICD-10-CM

## 2012-10-18 LAB — POCT INR: INR: 3.5

## 2012-10-20 ENCOUNTER — Ambulatory Visit: Payer: Medicare Other | Admitting: Physical Therapy

## 2012-10-25 ENCOUNTER — Ambulatory Visit: Payer: Medicare Other

## 2012-10-27 ENCOUNTER — Ambulatory Visit: Payer: Medicare Other

## 2012-10-31 ENCOUNTER — Encounter: Payer: Self-pay | Admitting: Internal Medicine

## 2012-11-01 ENCOUNTER — Other Ambulatory Visit: Payer: Self-pay | Admitting: *Deleted

## 2012-11-01 ENCOUNTER — Ambulatory Visit (INDEPENDENT_AMBULATORY_CARE_PROVIDER_SITE_OTHER): Payer: Medicare Other | Admitting: *Deleted

## 2012-11-01 ENCOUNTER — Ambulatory Visit (HOSPITAL_COMMUNITY)
Admission: RE | Admit: 2012-11-01 | Discharge: 2012-11-01 | Disposition: A | Discharge status: Home / Self Care | Payer: Medicare Other | Source: Ambulatory Visit | Attending: Internal Medicine | Admitting: Internal Medicine

## 2012-11-01 DIAGNOSIS — D735 Infarction of spleen: Secondary | ICD-10-CM

## 2012-11-01 DIAGNOSIS — G8929 Other chronic pain: Secondary | ICD-10-CM

## 2012-11-01 DIAGNOSIS — M545 Low back pain, unspecified: Secondary | ICD-10-CM

## 2012-11-01 DIAGNOSIS — Z7901 Long term (current) use of anticoagulants: Secondary | ICD-10-CM

## 2012-11-01 DIAGNOSIS — D7389 Other diseases of spleen: Secondary | ICD-10-CM | POA: Insufficient documentation

## 2012-11-01 DIAGNOSIS — I480 Paroxysmal atrial fibrillation: Secondary | ICD-10-CM

## 2012-11-01 DIAGNOSIS — I4891 Unspecified atrial fibrillation: Secondary | ICD-10-CM

## 2012-11-01 LAB — POCT INR: INR: 2.4

## 2012-11-01 MED ORDER — MORPHINE SULFATE ER 15 MG PO TBCR: 15.0000 mg | EXTENDED_RELEASE_TABLET | Freq: Two times a day (BID) | ORAL | Status: DC

## 2012-11-03 ENCOUNTER — Ambulatory Visit: Payer: Medicare Other

## 2012-11-07 IMAGING — CR DG CHEST 2V
2 series · 2 of 2 positions shown · non-contrast
Comparison: September 07, 2009

CLINICAL DATA: Preoperative respiratory exam for carotid
endarterectomy; smoker; hypertension diabetes

CHEST - 2 VIEW

[view not recorded (1 of 2)]
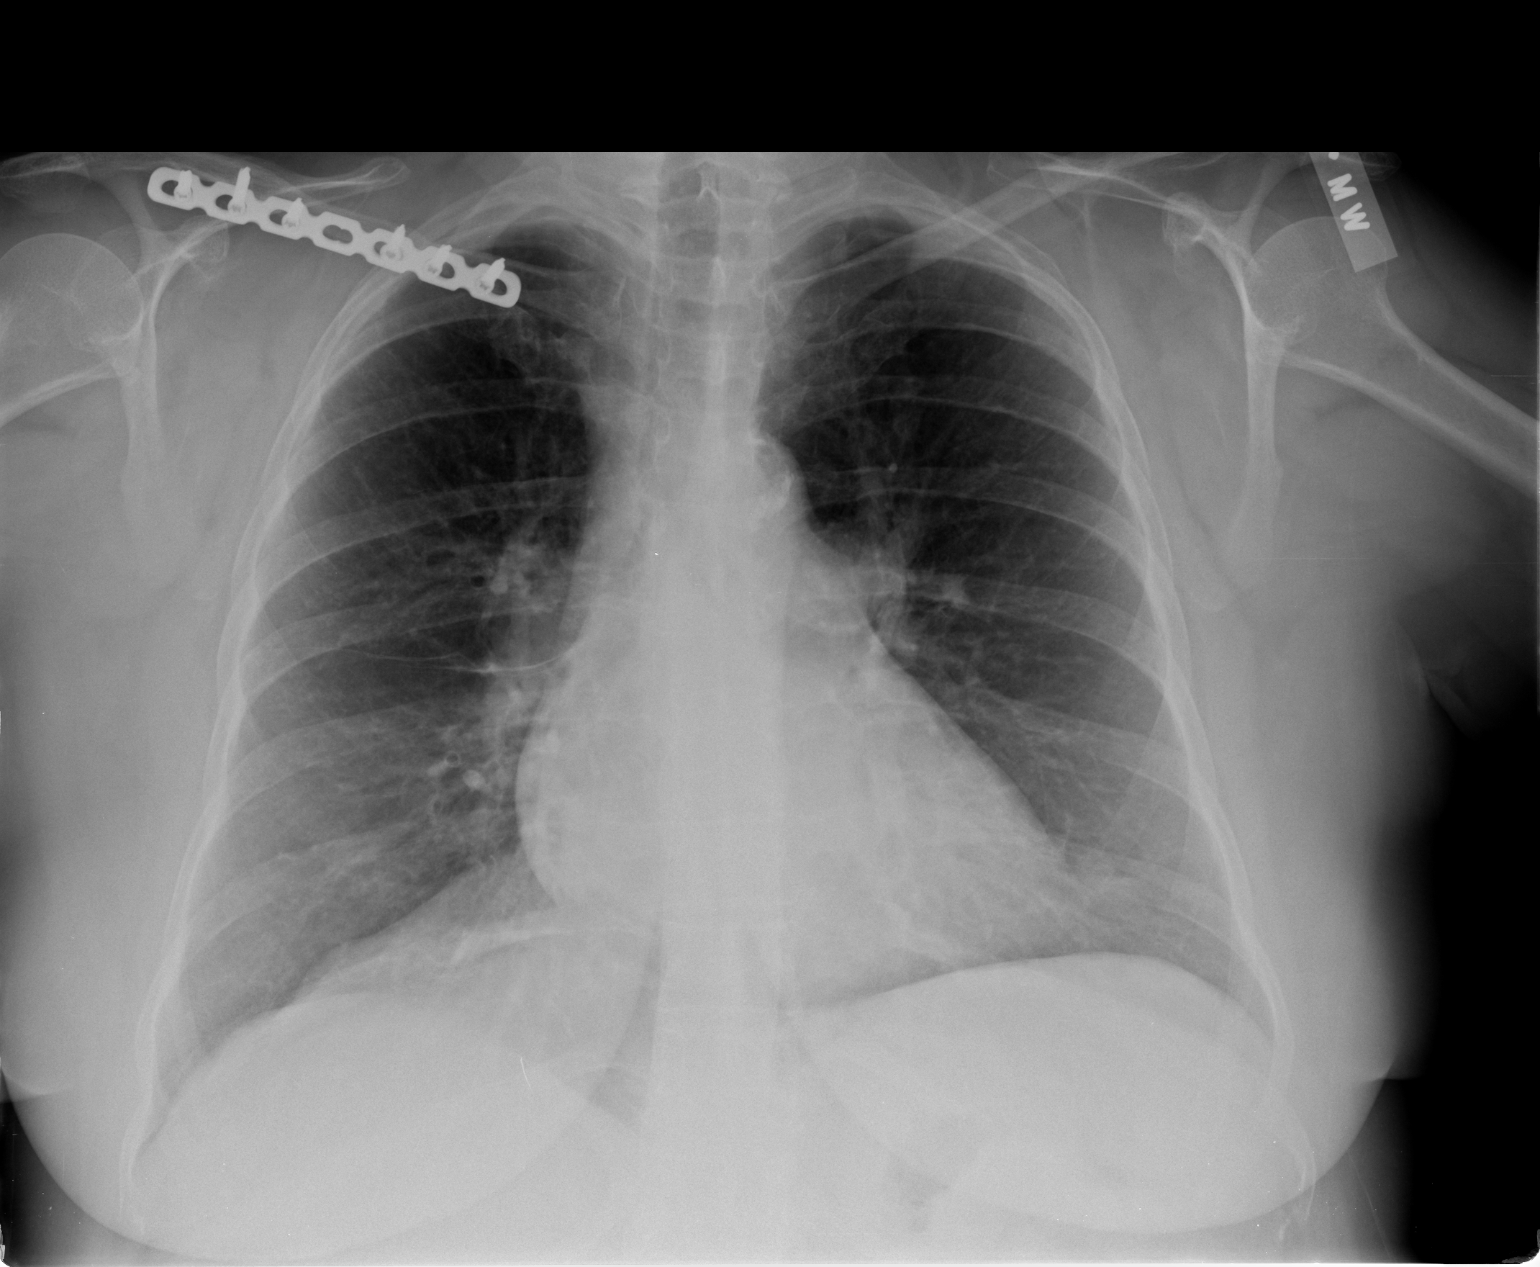

[view not recorded (2 of 2)]
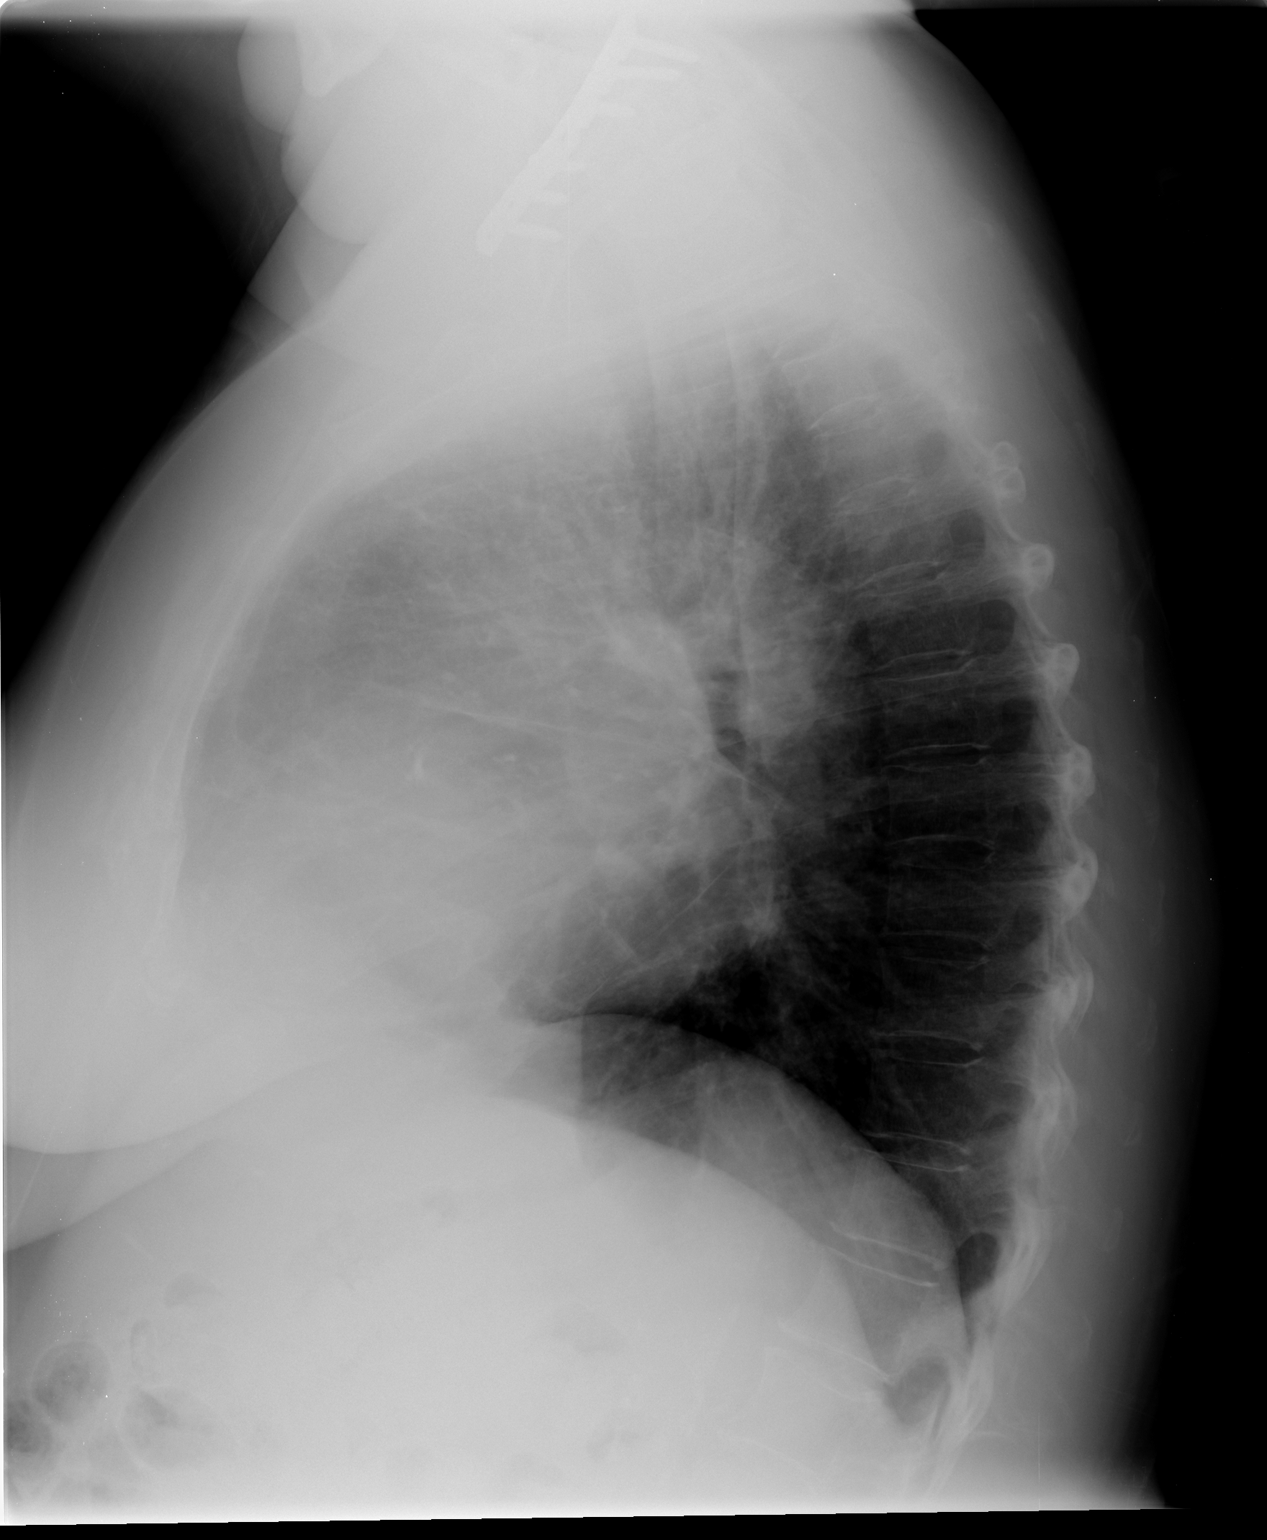

[2 of 2 positions shown; findings below may reference images not displayed]

FINDINGS: The cardiac silhouette, mediastinum, pulmonary
vasculature are within normal limits.  Both lungs are clear.
There is no acute bony abnormality.  There is orthopedic fixation
of the right clavicle, unchanged.
IMPRESSION: There is no evidence of acute cardiac or pulmonary process.

## 2012-11-08 ENCOUNTER — Other Ambulatory Visit: Payer: Self-pay | Admitting: Internal Medicine

## 2012-11-08 ENCOUNTER — Ambulatory Visit: Payer: Medicare Other

## 2012-11-08 DIAGNOSIS — F32A Depression, unspecified: Secondary | ICD-10-CM

## 2012-11-08 DIAGNOSIS — I5032 Chronic diastolic (congestive) heart failure: Secondary | ICD-10-CM

## 2012-11-08 DIAGNOSIS — F329 Major depressive disorder, single episode, unspecified: Secondary | ICD-10-CM

## 2012-11-10 ENCOUNTER — Ambulatory Visit: Payer: Medicare Other | Attending: Internal Medicine

## 2012-11-10 DIAGNOSIS — IMO0001 Reserved for inherently not codable concepts without codable children: Secondary | ICD-10-CM | POA: Insufficient documentation

## 2012-11-10 DIAGNOSIS — Z9181 History of falling: Secondary | ICD-10-CM | POA: Insufficient documentation

## 2012-11-10 DIAGNOSIS — R279 Unspecified lack of coordination: Secondary | ICD-10-CM | POA: Insufficient documentation

## 2012-11-10 DIAGNOSIS — R262 Difficulty in walking, not elsewhere classified: Secondary | ICD-10-CM | POA: Insufficient documentation

## 2012-11-15 ENCOUNTER — Ambulatory Visit: Payer: Medicare Other | Admitting: Physical Therapy

## 2012-11-17 ENCOUNTER — Ambulatory Visit: Payer: Medicare Other

## 2012-11-18 ENCOUNTER — Ambulatory Visit (INDEPENDENT_AMBULATORY_CARE_PROVIDER_SITE_OTHER): Payer: Medicare Other | Admitting: Internal Medicine

## 2012-11-18 VITALS — BP 117/69 | HR 97 | Temp 97.1°F | Ht 62.0 in | Wt 192.0 lb

## 2012-11-18 DIAGNOSIS — E1149 Type 2 diabetes mellitus with other diabetic neurological complication: Secondary | ICD-10-CM

## 2012-11-18 DIAGNOSIS — I872 Venous insufficiency (chronic) (peripheral): Secondary | ICD-10-CM

## 2012-11-18 DIAGNOSIS — J439 Emphysema, unspecified: Secondary | ICD-10-CM

## 2012-11-18 DIAGNOSIS — G589 Mononeuropathy, unspecified: Secondary | ICD-10-CM

## 2012-11-18 DIAGNOSIS — J438 Other emphysema: Secondary | ICD-10-CM

## 2012-11-18 DIAGNOSIS — S71009A Unspecified open wound, unspecified hip, initial encounter: Secondary | ICD-10-CM

## 2012-11-18 DIAGNOSIS — R296 Repeated falls: Secondary | ICD-10-CM

## 2012-11-18 DIAGNOSIS — S71102A Unspecified open wound, left thigh, initial encounter: Secondary | ICD-10-CM

## 2012-11-18 DIAGNOSIS — Z720 Tobacco use: Secondary | ICD-10-CM

## 2012-11-18 DIAGNOSIS — IMO0002 Reserved for concepts with insufficient information to code with codable children: Secondary | ICD-10-CM | POA: Insufficient documentation

## 2012-11-18 DIAGNOSIS — E1159 Type 2 diabetes mellitus with other circulatory complications: Secondary | ICD-10-CM

## 2012-11-18 DIAGNOSIS — E1140 Type 2 diabetes mellitus with diabetic neuropathy, unspecified: Secondary | ICD-10-CM

## 2012-11-18 DIAGNOSIS — Z9181 History of falling: Secondary | ICD-10-CM

## 2012-11-18 DIAGNOSIS — S71002A Unspecified open wound, left hip, initial encounter: Secondary | ICD-10-CM

## 2012-11-18 DIAGNOSIS — F17200 Nicotine dependence, unspecified, uncomplicated: Secondary | ICD-10-CM

## 2012-11-18 LAB — GLUCOSE, CAPILLARY: Glucose-Capillary: 216 mg/dL — ABNORMAL HIGH (ref 70–99)

## 2012-11-18 LAB — POCT GLYCOSYLATED HEMOGLOBIN (HGB A1C): Hemoglobin A1C: 6.7

## 2012-11-18 NOTE — Patient Instructions (Signed)
It was a pleasure seeing you in clinic today!  We do not feel that you need antibiotics at this time for your wound. To treat your wound we are making a referral for you to Wound Care. They will help you further manage your wound so that it heals appropriately.  If you begin to experience fevers, chills, sweats, or if you note increased redness, pain, red streaks, or pus coming from your wound you should seek medical care immediately, either with our office or the ED if your symptoms are severe.

## 2012-11-18 NOTE — Progress Notes (Signed)
Subjective:     Patient ID: [name], female   DOB: 25-Feb-1950, 62 y.o.   MRN: [id number]  HPI This is a 62 yo F w/ hx of uncontrolled T2DM, COPD, and chronic venous insufficiency on warfarin therapy here today for a left hip wound that appeared approx. 1 week ago.   Pt states that her wound appeared when she was sitting on the toilet and unconsciously went to scratch that area, at which time she noticed her hand was wet like "something busted, like a blister." She denies any blood or pus from the area. The area was subsequently tender and raw to touch rating it as 7-8/10. She has been washing the wound and applying neosporin to the area. On Monday she went to a therapy clinic to have the wound informally looked at by the staff, to which they expressed concern, told her to keep the wound covered, and advised her to seek medical care. Since this time she has tried to keep the wound covered with band aids, however she has had difficulty with this since she cannot actually see the wound. Her pain has improved over time but then worsened again and is now tender rating it 6/10. She states that it is very uncomfortable. Pt sleeps on affected side, which causes her some pain but she puts up with it since this is the side she likes to sleep on. She notes redness around the wound that she is able to see through the mirror, and some black coloration, but she is unsure of exactly what it looks like. Pt denies the site being warm to touch.   Pt still continues to smoke and is currently "not in the mind set to quit quite yet."  Review of Systems  Constitutional: Negative for fever, chills, diaphoresis and appetite change.  Respiratory: Positive for shortness of breath. Negative for chest tightness.        Had to use her albuterol inhaler this morning before leaving for home. Chronically on 1L O2 via [place]. Currently feels okay.  Cardiovascular: Positive for leg swelling. Negative for chest pain and palpitations.        Leg swelling at baseline. Nothing greater than usual.  Gastrointestinal: Positive for nausea. Negative for vomiting and diarrhea.       Reports nausea over the past week that comes and goes. Pt able to keep food and liquids down without difficulty. Started feeling slightly nauseated during today's visit.  Musculoskeletal: Positive for back pain.       Able to ambulate for 4 min, but then has to stop due to back pain - chronic issue, no change from baseline.  Neurological: Negative for headaches.       Many episodes of HA at baseline. Nothing greater than usual      Objective:   Physical Exam  Constitutional: She is oriented to person, place, and time. She appears well-nourished. No distress.  Sitting upright, on portable O2  HENT:  Head: Normocephalic and atraumatic.  Mouth/Throat: No oropharyngeal exudate.  Eyes: Conjunctivae and EOM are normal. Pupils are equal, round, and reactive to light.  No conjunctival pallor, erythema or icterus  Neck: Normal range of motion. Neck supple.  Cardiovascular: Normal rate, regular rhythm and normal heart sounds.  Exam reveals no gallop and no friction rub.   No murmur heard. 2+ radial and dorsalis pedis pulses bilaterally  Pulmonary/Chest: Effort normal. No respiratory distress.  Mild diffuse crackles bilaterally.   Musculoskeletal: Normal range of motion. She exhibits  edema.  2+ pitting edema bilaterally  Lymphadenopathy:    She has no cervical adenopathy.  Neurological: She is alert and oriented to person, place, and time.  No focal deficits. Answers questions appropriately and speaks in full sentences.  Skin: Skin is warm and dry. Lesion noted. She is not diaphoretic.     Primary wound - Located about lateral midaxillary line near the waist. Slightly greater than 1 cm along long axis wound with firm black eschar centrally, surrounding non-draining pus, and surrounding erythema extending out approx 1-2 cm in all directions. Pain to  palpation to areas directly proximal to wound as well as to non-erythematous skin approx 2-3 inches from wound.  Rash - Approx 2 cm in dia discoid erythematous rash, whose central areas appear to be forming vesicles. Area mildly tender to palpation. No other rashes found in similar distribution or in same dermatome.  Psychiatric: Her behavior is normal. Thought content normal.  Please see nurse's note for Diabetes Foot Exam  Filed Vitals:   11/18/12 1353  BP: 117/69  Pulse: 97  Temp: 97.1 F (36.2 C)  TempSrc: Oral  Height: 5' 2" (1.575 m)  Weight: 192 lb (87.091 kg)  SpO2: 92%  A1c: 6.7    Assessment:     This is a 62 yo F w/ now controlled T2DM, COPD, and chronic venous insufficiency on warfarin therapy with a wound w/o signs of systemic infxn, being treated as an outpatient.  For full assessment, please see problem-oriented charting    Plan:      Please see problem-oriented charting for plan.

## 2012-11-18 NOTE — Assessment & Plan Note (Signed)
Currently stable. 1. No further management necessary at this time

## 2012-11-18 NOTE — Assessment & Plan Note (Addendum)
Currently well-controlled with A1c of 6.7. Goal <7.0. 1. Continue current diabetes regimen 2. No further management or evaluation necessary at this time. 3. F/u diabetes in December with Dr. Eppie Gibson (PCP)   Attending A&P : She has been able to lower her A1C to 6.7 and is quit proud of her success. She did this by just "taking her meds'. She didn't institute any sig diet or exercise.

## 2012-11-18 NOTE — Assessment & Plan Note (Addendum)
Wound appears reactive rather than infected. This is favored by lack of induration, drainage, and systemic signs and symptoms such as fever, chills, etc. Given pt's DM, at high risk for infxn, however, pt also on warfarin therapy and is experiencing nausea which may worsen with antibiotic use. 1. Will forgo antibiotics for now unless pt experiencing fever, increased pain, swelling, etc. Unable to care for wound by self, so referral to Wound Care for proper care and bandaging of wound is appropriate at this time. Made referral today for wound care and possible debridement to remove eschar and clean wound further. Recommendations of Wound Care appreciated.   Attending A&P: I do not feel that wound will heal without debridement, wither chemical or surgical, of the eschar even though it is small. She is at risk of complications 2/2 obesity and DM and inability to assess the wound herself. Therefore, will refer the pt to the wound care center. She has [facility] for transportation. The risks of ABX are greater than the benefits as I feel the erythema is likely reactive rather than bacteria induced, she is on warfarin, and already has new nausea. She has no signs / sxs of systemic infxn so therefore, will not rx ABX and rely on topical tx. Until she gets into wound care, keep clean and dry and keep pressure off wound.

## 2012-11-19 ENCOUNTER — Ambulatory Visit: Payer: Medicare Other | Admitting: Internal Medicine

## 2012-11-19 ENCOUNTER — Telehealth: Payer: Self-pay | Admitting: Dietician

## 2012-11-19 DIAGNOSIS — E1140 Type 2 diabetes mellitus with diabetic neuropathy, unspecified: Secondary | ICD-10-CM

## 2012-11-19 NOTE — Assessment & Plan Note (Signed)
She still has trace LE edema B but she states less than before and wants to decrease her lasix dose with Dr Eppie Gibson next appt.

## 2012-11-19 NOTE — Assessment & Plan Note (Signed)
She is going to biweekly neurorehab sessions to improve her gait. Although she doesn't like the sessions, she feels that they will ultimately be beneficial.

## 2012-11-19 NOTE — Telephone Encounter (Signed)
Called to congratulate her on her lowered A1C. She says all she has done is take her insulin as prescribed which is not different than she has done previously . Wants to retry Levemir pens, but doesn't need them now. Will also needs more pen needles. ( 5 pen needles a day)

## 2012-11-19 NOTE — Progress Notes (Signed)
  Subjective:    Patient ID: [name], female    DOB: Sep 24, 1950, 62 y.o.   MRN: [id number]  Wound Check  Hip Pain   Diabetes    [name] is a 62 yo pt of Dr Eppie Gibson. She has DM II, obesity, and gait abnl. She noticed a sore on her R lateral hip about one week ago that was painful. It is not located in an area that she can see but could feel it. It has not beeen bleeding or oozing. She has had no injury to that area. She denies fever or malaise. She was seen at neurorehab for gait training and had it looked at and was rec to seek medical attention. She has had about one week of nausea that is unusual for her. It was not affected her appetite and has not caused vomiting. She has never had a wound before and has never had any amputation for non healing wound.   Review of Systems  Constitutional: Negative for fever and appetite change.  HENT: Negative for trouble swallowing.   Respiratory: Negative for shortness of breath.   Cardiovascular: Positive for leg swelling.       DOE. States can walk 4 min and stops 2/2 to back pain  Gastrointestinal: Positive for nausea. Negative for vomiting.  Musculoskeletal: Positive for back pain and gait problem.  Skin: Positive for wound.  Neurological:       Gait imbalance       Objective:   Physical Exam  Constitutional: She is oriented to person, place, and time. She appears well-developed and well-nourished. No distress.  HENT:  Head: Normocephalic and atraumatic.  Right Ear: External ear normal.  Left Ear: External ear normal.  Nose: Nose normal.  Eyes: Conjunctivae are normal.  Musculoskeletal: She exhibits edema. She exhibits no tenderness.  Neurological: She is alert and oriented to person, place, and time.  Skin: Skin is warm and dry. She is not diaphoretic.  There is a tiny (5 - 8 mm) eschar with about 3 cm surrounding erythema. There is no fluctuance, no sig tenderness, no odor, no pus.   Psychiatric: She has a normal mood and  affect. Her behavior is normal. Judgment and thought content normal.          Assessment & Plan:

## 2012-11-19 NOTE — Assessment & Plan Note (Signed)
She is not yet ready to quit and knows that an open flame and O2 do not mix well.

## 2012-11-22 ENCOUNTER — Ambulatory Visit: Payer: Medicare Other | Admitting: Physical Therapy

## 2012-11-22 ENCOUNTER — Ambulatory Visit (INDEPENDENT_AMBULATORY_CARE_PROVIDER_SITE_OTHER): Payer: Medicare Other | Admitting: *Deleted

## 2012-11-22 DIAGNOSIS — I4891 Unspecified atrial fibrillation: Secondary | ICD-10-CM

## 2012-11-22 DIAGNOSIS — Z7901 Long term (current) use of anticoagulants: Secondary | ICD-10-CM

## 2012-11-22 DIAGNOSIS — I480 Paroxysmal atrial fibrillation: Secondary | ICD-10-CM

## 2012-11-22 DIAGNOSIS — D735 Infarction of spleen: Secondary | ICD-10-CM

## 2012-11-22 DIAGNOSIS — D7389 Other diseases of spleen: Secondary | ICD-10-CM

## 2012-11-22 LAB — POCT INR: INR: 2.4

## 2012-11-23 ENCOUNTER — Other Ambulatory Visit (HOSPITAL_COMMUNITY)
Admission: RE | Admit: 2012-11-23 | Discharge: 2012-11-23 | Disposition: A | Discharge status: Home / Self Care | Payer: Medicare Other | Source: Ambulatory Visit | Attending: Internal Medicine | Admitting: Internal Medicine

## 2012-11-23 ENCOUNTER — Ambulatory Visit (INDEPENDENT_AMBULATORY_CARE_PROVIDER_SITE_OTHER): Payer: Medicare Other | Admitting: Internal Medicine

## 2012-11-23 VITALS — BP 118/57 | HR 78 | Temp 97.0°F | Ht 62.0 in | Wt 194.5 lb

## 2012-11-23 DIAGNOSIS — L139 Bullous disorder, unspecified: Secondary | ICD-10-CM | POA: Insufficient documentation

## 2012-11-23 DIAGNOSIS — Z5189 Encounter for other specified aftercare: Secondary | ICD-10-CM

## 2012-11-23 DIAGNOSIS — S71002D Unspecified open wound, left hip, subsequent encounter: Secondary | ICD-10-CM

## 2012-11-23 DIAGNOSIS — L989 Disorder of the skin and subcutaneous tissue, unspecified: Secondary | ICD-10-CM | POA: Insufficient documentation

## 2012-11-23 LAB — GLUCOSE, CAPILLARY: Glucose-Capillary: 117 mg/dL — ABNORMAL HIGH (ref 70–99)

## 2012-11-23 MED ORDER — INSULIN PEN NEEDLE 31G X 5 MM MISC: Status: DC

## 2012-11-23 MED ORDER — INSULIN DETEMIR 100 UNIT/ML FLEXPEN: PEN_INJECTOR | SUBCUTANEOUS | Status: DC

## 2012-11-23 NOTE — Assessment & Plan Note (Addendum)
Eschar appears stable, and decreased pain may be indicative of improvement.  1. Applied santyl, then hydrogel. Covered with non-adhesive bandage. This is as per Wound Care clinic's recs until patent can be seen - only deviation from recs was that area was not cross-hatched due to lack of technical expertise. 2. Showed pt's sister how to apply gels and bandage wound so that process can be repeated daily until pt able to receive definitive care at wound care clinic.  Attending update - -   Agree with noted plan.  Sister will help with wound care until patient can be established in that clinic.

## 2012-11-23 NOTE — Progress Notes (Signed)
Subjective:     Patient ID: [name], female   DOB: [birth year], 62 y.o.   MRN: [id number]  HPI This is a 62 yo F w/ hx of T2DM here today for follow-up on wound care of eschar to left hip. She is here today with her sister.  Since previous visit, her other rash on her left back has developed into a blister that has not yet popped. She states that it was very itchy previously. Otherwise, she reports doing well. No fevers, chills, or sweats. No increased pain, redness, swelling, or pus expressing from eschar wound or her blister. No other new lesions. She reports decreased pain overall, and the site of the eschar no longer hurts. She denies pain at the site of the blister.   Review of Systems As per ROS    Objective:   Physical Exam  Constitutional: She appears well-developed and well-nourished. No distress.  HENT:  Head: Normocephalic and atraumatic.  Eyes: Conjunctivae and EOM are normal.  Pulmonary/Chest: Effort normal. No respiratory distress.  On 1 L of O2  Skin: Skin is warm and dry. She is not diaphoretic.  Eschar unchanged in size from previous visit (10/9). Less erythema to surrounding area, though still present. Eschar no longer shows border of pus. No pain to palpation at or near site.   Bullous that appears deflated on an erythematous base. Non-tender to palpation. Approx 3-4 cm along long axis. No other similar lesions noted.      Assessment:     This is an otherwise at baseline 62 yo F w/ hx of T2DM here today for follow-up on wound care of eschar and new-onset bullous lesion that is suspicious for bullous pemphigoid or pemphigus vulgaris, being managed as an outpatient.   For full assessment, please see problem oriented charting.    Plan:     For plan, please see problem-oriented charting.

## 2012-11-23 NOTE — Assessment & Plan Note (Addendum)
Itchiness and development of bullous lesion is suggestive of bullous pemphigoid or pemphigus vulgaris. Pt is also falls in correct age group and distribution on trunk fits with this diagnosis.  Zoster eruption not likely due to lack of pain to site and non-dermatomal distribution (or lack of a dermatomal distribution).  1. Performed punch biopsies today and sent for H&E and DIF for dx. Pending results of histology, clobetasol 0.05% cream to be applied topically if bullous pemphigoid. Systemic glucocorticoids if pemphigus vulgaris. No further treatment or evaluation necessary at this time. 2. Advised pt to keep biopsies sites covered with bandage.  Attending update - -   Agree with plan, will await biopsy results for further plan.

## 2012-11-23 NOTE — Patient Instructions (Addendum)
It was a pleasure seeing you in clinic today again.  For care of your wound on your left hip: 1. Wash your hands with soap and water, dry your hands thoroughly, and then put on exam gloves.  2. Clean the area first with sterile water or normal saline applied to a gauze. 3. Using a Q-tip apply a pea-sized amount of Santyl (collagenase) to the entire dark and hard area of the wound. Try not to get the Santyl on normal skin as this can cause irritation. 4. Using a different Q-tip, apply a pea-size amount of hydrogel that is in the "oil bottle" to the area to which you just applied the Santyl. A good tip to do this touch the gel to the santyl and then roll the pea-size amount off of the Q-tip and then spread to the other areas.  5. Cut a square of the non-adhesive bandage that is large enough to just cover the dark wound and the surrounding redness. 6. Tape on all four sides.   You had punch biopsies performed today on your other blister. Once we received the results, we will let you know. For the punch biopsy sites you may keep these covered with a band aid until they heal. If you experience fever, chills, or develop increased pain, redness, or swelling to the site, please call our office immediately to schedule an appointment.  Please schedule a follow up appointment on Monday or Tuesday of next week (10/20 or 10/21) with Margrett Rud, to have your wounds looked at.

## 2012-11-23 NOTE — Progress Notes (Signed)
  Subjective:    Patient ID: [name], female    DOB: [birth year], 62 y.o.   MRN: [id number]  CC: Wound bandage change.   HPI  [name] is a 62yo woman with PMH of T2DM, COPD, chronic venous insufficiency who presents for bandage change.  She presented earlier this month for a non healing wound that was deemed to be non infectious.  She has wound care appointment later this month, but currently needs bandage changes.  She presents for one today.  During the previous visit, the wound care team was contacted and recommended cross-hatching, santyl and hydrogel.   Today, she has a new lesion present on her left upper back.  This lesion is on a red base and bullous in nature.  She has no pain, but does have significant itching at the area, and did have itching prior to the bulla popping up.  The old lesion on her left hip/flank is somewhat improved from previous with less erythema per patient.    She is wearing her O2 by [place].   Review of Systems  Constitutional: Negative for fever and chills.  Respiratory: Positive for cough (chronic) and shortness of breath. Negative for wheezing.   Cardiovascular: Negative for chest pain.  Gastrointestinal: Negative for nausea, vomiting and abdominal pain.  Skin: Positive for rash (bullae and eschar ) and wound. Negative for color change.       Objective:   Physical Exam  Vitals reviewed. Constitutional: She is oriented to person, place, and time. She appears well-developed. No distress.  Wearing  for O2  HENT:  Head: Normocephalic and atraumatic.  Eyes: Conjunctivae are normal. No scleral icterus.  Cardiovascular: Normal rate and regular rhythm.   Abdominal: Soft.  Musculoskeletal: She exhibits no edema and no tenderness.  Neurological: She is alert and oriented to person, place, and time.  Skin: Skin is warm and dry. No rash noted.  + quarter sized lesion to left hip with eschar formation over and small ring of erythema.  Non tender.  New  bullous lesion, irregular, ovoid shaped, non tense with erythematous base on left upper back.   Psychiatric: She has a normal mood and affect. Her behavior is normal.    No labs today.   Procedure - punch biopsy of skin X 2  Time out procedure and patient consent were performed.  The patient was placed in the right lateral decubitus position and the area was prepped in a sterile fashion.  ~1cc of 1% lidocaine was delivered under the skin first at a lesional site (left lower border of teh lesion) and a perilesional site (above and to the right of the lesion).  A 4m punch biopsy of the area was taken at both sites and placed in formalin.  Hemostasis was obtained with pressure to the site along with a small amount of hypercare solution.  The area was dressed with a 4X4 and paper tape.  The patient tolerated the procedure well.       Assessment & Plan:  RTC after biopsy results.

## 2012-11-29 ENCOUNTER — Ambulatory Visit: Payer: Medicare Other | Admitting: Physical Therapy

## 2012-11-30 ENCOUNTER — Ambulatory Visit: Payer: Medicare Other

## 2012-11-30 ENCOUNTER — Ambulatory Visit (INDEPENDENT_AMBULATORY_CARE_PROVIDER_SITE_OTHER): Payer: Medicare Other | Admitting: Internal Medicine

## 2012-11-30 VITALS — BP 90/51 | HR 74 | Temp 97.3°F | Ht 62.0 in | Wt 193.8 lb

## 2012-11-30 DIAGNOSIS — E1149 Type 2 diabetes mellitus with other diabetic neurological complication: Secondary | ICD-10-CM

## 2012-11-30 DIAGNOSIS — E1140 Type 2 diabetes mellitus with diabetic neuropathy, unspecified: Secondary | ICD-10-CM

## 2012-11-30 DIAGNOSIS — G8929 Other chronic pain: Secondary | ICD-10-CM

## 2012-11-30 DIAGNOSIS — S71002S Unspecified open wound, left hip, sequela: Secondary | ICD-10-CM

## 2012-11-30 DIAGNOSIS — IMO0002 Reserved for concepts with insufficient information to code with codable children: Secondary | ICD-10-CM

## 2012-11-30 DIAGNOSIS — M545 Low back pain, unspecified: Secondary | ICD-10-CM

## 2012-11-30 DIAGNOSIS — G589 Mononeuropathy, unspecified: Secondary | ICD-10-CM

## 2012-11-30 DIAGNOSIS — L139 Bullous disorder, unspecified: Secondary | ICD-10-CM

## 2012-11-30 LAB — GLUCOSE, CAPILLARY: Glucose-Capillary: 138 mg/dL — ABNORMAL HIGH (ref 70–99)

## 2012-11-30 MED ORDER — MORPHINE SULFATE ER 15 MG PO TBCR: 15.0000 mg | EXTENDED_RELEASE_TABLET | Freq: Two times a day (BID) | ORAL | Status: DC

## 2012-11-30 MED ORDER — HYDROCODONE-ACETAMINOPHEN 5-325 MG PO TABS: 1.0000 | ORAL_TABLET | Freq: Four times a day (QID) | ORAL | Status: DC | PRN

## 2012-11-30 NOTE — Progress Notes (Signed)
Subjective:     Patient ID: [name], female   DOB: 06-10-1950, 62 y.o.   MRN: [id number]  HPI This is a 62 yo F w/ hx of T2DM here today for follow-up on wound care of eschar to left hip and biopsy sites on left back from 10/14. She is here today with her sister.  Pt has no concerns today. States that everything regarding the wounds has been the same, although her biopsy sites have been itchy. No new lesions. Denies fevers, chills, sweats, increased pain, redness, or swelling at biopsy and wound sites. Sister confirms. Sister further states that the wound eschar turned yellow-ish color starting the next day. Pt's sister states that she "needs a break" from providing her sister with aid in dressing the wounds and applying medicine. They inquire about further use of Santyl and hydrogel, skipping a day of application of Santyl and hydrogel, as well as the biopsy results.   Review of Systems As per HPI    Objective:   Physical Exam  Constitutional: She is oriented to person, place, and time. She appears well-developed and well-nourished. No distress.  HENT:  Head: Normocephalic and atraumatic.  Eyes: EOM are normal.  Cardiovascular: Normal rate and regular rhythm.   2+ radial  Pulmonary/Chest: Effort normal. No respiratory distress.  On 1 L portable O2  Neurological: She is alert and oriented to person, place, and time.  No focal deficits noted  Skin: She is not diaphoretic.  Wound @ left hip: Unchanged in size. Slightly increased surrounding erythema w/o expansion. Black, firm eschar is NOW soft appearing yellow slough. No pain to palpation near/surrounding wound.  Biopsy sites @ left back: No warmth, erythema, swelling, or pain to palpation of sites. Two scabbed over holes from punch biopsy present approx. 2-3 mm in size.   No other lesions noted elsewhere on body.   Filed Vitals:   11/30/12 1431  BP: 90/51  Pulse: 74  Temp: 97.3 F (36.3 C)  Height: _0  (1.575 m)   Weight: 193 lb 12.8 oz (87.907 kg)  SpO2: 97%       Assessment:     This is a well-appearing, afebrile 62 yo F w/ hx of T2DM who is in stable condition with no new lesions, a stable wound and healing biopsy sites.  Problem List: 1. Wound 2. Biopsies     Plan:     1. Wound - Given the transformation of firm eschar to yellow slough, this indicates Santyl is working. Confirmed this with wound care clinic by phone that this is indeed what the wound should look like. They advised continuing treatment until pt's appointment. Surrounding erythema can be explained by incidental application of Santyl to surrounding normal tissue causing slight irritation. No signs of infxn, no increased spread, redness, pain, or swelling. Wound is stable. - Continue Santyl and hydrogel as pt has been doing. Advised pt and sister that it would be okay if Santyl and hydrogel was applied every other day.  2. Biopsies - Results still pending. Talked to [facility] in histology lab and should have report in next 24-48 hrs. Sites show no sign of infection and appear to be healing.  - Advised pt she may use Vaseline to cover biopsy sites to prevent moisture and maceration. No need to keep site bandaged unless it prevents her from itching the area. - F/u biopsy results. Call pt with results and further instructions for treatment/evaluation.

## 2012-11-30 NOTE — Progress Notes (Signed)
Patient ID: [name], female   DOB: 09-Apr-1950, 62 y.o.   MRN: [id number]   HPI - This 62 year old white caucasian lady is here to follow up on two blister like wounds that erupted a month ago on her left hip and left side of back (thoracic region). She was seen on 11/23/12 by Dr. Daryll Drown and a punch biopsy was done. The pathology results are not back yet. She reports no further lesions, and is generally keeping well. She is aplying the santyl and hydrogel dressings on the wounds as instructed. She continues to have some itching on the back wound. No fever, chills, numbness, tingling.   ROS - As per HPI.   Focussed PE -   Left hip wound - oval, about 1 cm diameter, eschar that was previously present has partly sloughed off and yellow material seen at base, not purulent, slight erythema at margins. No scratch marks. No blisters. No warmth, swelling or tenderness around the area.   Left back wound - appears to be healing 1.5 cm oval wound. No discharge, clean base, dry wound, no eschar remaining, some erythema surrounding margins, no warmth, tenderness or swelling in the area.   Labs - none today  Assessment and Plan - Prior differentials were bullous pemphigoid versus pemphigus vulgaris. Basically today's visit, I assume, was to follow up on the patient after the punch biopsy, and reassess the wound post pathology results. We called the pathology lab and the results should be back tomorrow most likely. The patient has a wound care clinic appointment for 12/06/12. I reassured them that the wound appeared to be healing at this point, and they were instructed to continue the santyl and collagenase dressings. We tried to call the wound care center and they endorsed our assessment and agreed on the decision to continue same treatment. We will await pathology results for further intervention.  The patient asked for refills on morphine and norco and they have been provided for 1 month.  We will make  this visit for the patient as a no charge visit.

## 2012-11-30 NOTE — Patient Instructions (Addendum)
Your biopsy site and wound appear stable, and we are not currently concerned for infection.  Please continue to apply the Santyl and hydrogel as you have been doing. It is okay if this is done every other day if that is necessary. We should have the biopsy results in the next 1-2 days. We will call you once we receive them and let you know of any medication changes or further steps for evaluation that may be necessary.  If you experience fevers, chills, or increased pain, swelling, or redness to your biopsy sites or wound, please do not hesitate to call us for another appointment, as these may be signs of infection. Your appointment with wound care is on 12/06/2012.  Take Care!

## 2012-12-01 ENCOUNTER — Other Ambulatory Visit: Payer: Self-pay | Admitting: Internal Medicine

## 2012-12-01 MED ORDER — DOXEPIN HCL (ANTIPRURITIC) 5 % EX CREA: TOPICAL_CREAM | Freq: Three times a day (TID) | CUTANEOUS | Status: DC

## 2012-12-01 NOTE — Progress Notes (Signed)
  Documentation for Lab results - Derm Path   Reviewed Dermatological Pathology results from skin biopsy. It suggests that the bullae are most likely a drug reaction.    Assessment and Plan   Since the wounds are already in the healing process, I would continue current therapy and suggest to keep the appointment with wound care on 12/06/12, depending on how healed the wound is at the time. If there is considerable recovery, then the patient might cancel the appointment, as she had previously desired in my visit with her. For the itching, I will ask the patient to take OTC oral benadryl and I have prescribed topical doxepin oint if the patient feels the itching is really severe.     Called the patient to discuss results at the number given in the chart, but could only reach VM. Left message to call clinic back.

## 2012-12-02 ENCOUNTER — Telehealth: Payer: Self-pay | Admitting: *Deleted

## 2012-12-02 NOTE — Telephone Encounter (Signed)
Pt states she understands that she will receive an ointment for itching of the areas of concern. She also states she did not sign a new medication contract on 10/21 and needs to do so. Also she states she started having diarrhea yesterday morning that caused irritation to the anal area, today the diarrhea continues and is watery diarrhea at this time, with 3-4 movements today. She states she will call if the diarrhea continues or becomes worse.

## 2012-12-02 NOTE — Telephone Encounter (Signed)
Agree with plan as outlined.

## 2012-12-03 ENCOUNTER — Encounter (HOSPITAL_COMMUNITY): Payer: Self-pay | Admitting: Emergency Medicine

## 2012-12-03 ENCOUNTER — Telehealth: Payer: Self-pay | Admitting: Internal Medicine

## 2012-12-03 ENCOUNTER — Observation Stay (HOSPITAL_COMMUNITY)
Admission: EM | Admit: 2012-12-03 | Discharge: 2012-12-04 | Disposition: A | Discharge status: Home / Self Care | Payer: Medicare Other | Attending: Infectious Diseases | Admitting: Infectious Diseases

## 2012-12-03 DIAGNOSIS — I5033 Acute on chronic diastolic (congestive) heart failure: Secondary | ICD-10-CM | POA: Diagnosis present

## 2012-12-03 DIAGNOSIS — Z79899 Other long term (current) drug therapy: Secondary | ICD-10-CM | POA: Insufficient documentation

## 2012-12-03 DIAGNOSIS — F321 Major depressive disorder, single episode, moderate: Secondary | ICD-10-CM | POA: Diagnosis present

## 2012-12-03 DIAGNOSIS — K5289 Other specified noninfective gastroenteritis and colitis: Secondary | ICD-10-CM | POA: Insufficient documentation

## 2012-12-03 DIAGNOSIS — R197 Diarrhea, unspecified: Principal | ICD-10-CM | POA: Insufficient documentation

## 2012-12-03 DIAGNOSIS — E1169 Type 2 diabetes mellitus with other specified complication: Secondary | ICD-10-CM | POA: Diagnosis present

## 2012-12-03 DIAGNOSIS — E1149 Type 2 diabetes mellitus with other diabetic neurological complication: Secondary | ICD-10-CM | POA: Insufficient documentation

## 2012-12-03 DIAGNOSIS — I5032 Chronic diastolic (congestive) heart failure: Secondary | ICD-10-CM | POA: Diagnosis present

## 2012-12-03 DIAGNOSIS — J438 Other emphysema: Secondary | ICD-10-CM | POA: Insufficient documentation

## 2012-12-03 DIAGNOSIS — I959 Hypotension, unspecified: Secondary | ICD-10-CM

## 2012-12-03 DIAGNOSIS — F17200 Nicotine dependence, unspecified, uncomplicated: Secondary | ICD-10-CM | POA: Insufficient documentation

## 2012-12-03 DIAGNOSIS — Z7901 Long term (current) use of anticoagulants: Secondary | ICD-10-CM

## 2012-12-03 DIAGNOSIS — E118 Type 2 diabetes mellitus with unspecified complications: Secondary | ICD-10-CM | POA: Diagnosis present

## 2012-12-03 DIAGNOSIS — F322 Major depressive disorder, single episode, severe without psychotic features: Secondary | ICD-10-CM | POA: Diagnosis present

## 2012-12-03 DIAGNOSIS — J441 Chronic obstructive pulmonary disease with (acute) exacerbation: Secondary | ICD-10-CM | POA: Diagnosis present

## 2012-12-03 DIAGNOSIS — E860 Dehydration: Secondary | ICD-10-CM | POA: Insufficient documentation

## 2012-12-03 DIAGNOSIS — F3289 Other specified depressive episodes: Secondary | ICD-10-CM | POA: Insufficient documentation

## 2012-12-03 DIAGNOSIS — X58XXXA Exposure to other specified factors, initial encounter: Secondary | ICD-10-CM | POA: Insufficient documentation

## 2012-12-03 DIAGNOSIS — I509 Heart failure, unspecified: Secondary | ICD-10-CM | POA: Insufficient documentation

## 2012-12-03 DIAGNOSIS — F329 Major depressive disorder, single episode, unspecified: Secondary | ICD-10-CM | POA: Insufficient documentation

## 2012-12-03 DIAGNOSIS — S79919A Unspecified injury of unspecified hip, initial encounter: Secondary | ICD-10-CM | POA: Insufficient documentation

## 2012-12-03 DIAGNOSIS — I4891 Unspecified atrial fibrillation: Secondary | ICD-10-CM | POA: Insufficient documentation

## 2012-12-03 DIAGNOSIS — Z720 Tobacco use: Secondary | ICD-10-CM | POA: Diagnosis present

## 2012-12-03 DIAGNOSIS — Z952 Presence of prosthetic heart valve: Secondary | ICD-10-CM | POA: Insufficient documentation

## 2012-12-03 DIAGNOSIS — E1142 Type 2 diabetes mellitus with diabetic polyneuropathy: Secondary | ICD-10-CM | POA: Insufficient documentation

## 2012-12-03 DIAGNOSIS — I480 Paroxysmal atrial fibrillation: Secondary | ICD-10-CM | POA: Diagnosis present

## 2012-12-03 DIAGNOSIS — I10 Essential (primary) hypertension: Secondary | ICD-10-CM | POA: Diagnosis present

## 2012-12-03 DIAGNOSIS — E1140 Type 2 diabetes mellitus with diabetic neuropathy, unspecified: Secondary | ICD-10-CM | POA: Diagnosis present

## 2012-12-03 DIAGNOSIS — I872 Venous insufficiency (chronic) (peripheral): Secondary | ICD-10-CM | POA: Insufficient documentation

## 2012-12-03 DIAGNOSIS — G4733 Obstructive sleep apnea (adult) (pediatric): Secondary | ICD-10-CM | POA: Insufficient documentation

## 2012-12-03 LAB — COMPREHENSIVE METABOLIC PANEL
ALT: 13 U/L (ref 0–35)
AST: 13 U/L (ref 0–37)
Albumin: 3.5 g/dL (ref 3.5–5.2)
Alkaline Phosphatase: 47 U/L (ref 39–117)
BUN: 16 mg/dL (ref 6–23)
CO2: 26 mEq/L (ref 19–32)
Calcium: 9.2 mg/dL (ref 8.4–10.5)
Chloride: 100 mEq/L (ref 96–112)
Creatinine, Ser: 1.06 mg/dL (ref 0.50–1.10)
GFR calc Af Amer: 63 mL/min — ABNORMAL LOW (ref >90)
GFR calc non Af Amer: 55 mL/min — ABNORMAL LOW (ref >90)
Glucose, Bld: 169 mg/dL — ABNORMAL HIGH (ref 70–99)
Potassium: 4 mEq/L (ref 3.5–5.1)
Sodium: 138 mEq/L (ref 135–145)
Total Bilirubin: 0.2 mg/dL — ABNORMAL LOW (ref 0.3–1.2)
Total Protein: 6.8 g/dL (ref 6.0–8.3)

## 2012-12-03 LAB — CLOSTRIDIUM DIFFICILE BY PCR: Toxigenic C. Difficile by PCR: NEGATIVE

## 2012-12-03 LAB — CBC
HCT: 36.8 % (ref 36.0–46.0)
Hemoglobin: 12.4 g/dL (ref 12.0–15.0)
MCH: 31.4 pg (ref 26.0–34.0)
MCHC: 33.7 g/dL (ref 30.0–36.0)
MCV: 93.2 fL (ref 78.0–100.0)
Platelets: 238 10*3/uL (ref 150–400)
RBC: 3.95 MIL/uL (ref 3.87–5.11)
RDW: 14.7 % (ref 11.5–15.5)
WBC: 12.5 10*3/uL — ABNORMAL HIGH (ref 4.0–10.5)

## 2012-12-03 LAB — GLUCOSE, CAPILLARY
Glucose-Capillary: 183 mg/dL — ABNORMAL HIGH (ref 70–99)
Glucose-Capillary: 172 mg/dL — ABNORMAL HIGH (ref 70–99)

## 2012-12-03 LAB — MAGNESIUM: Magnesium: 2 mg/dL (ref 1.5–2.5)

## 2012-12-03 LAB — PROTIME-INR
INR: 1.43 (ref 0.00–1.49)
Prothrombin Time: 17.1 seconds — ABNORMAL HIGH (ref 11.6–15.2)

## 2012-12-03 LAB — LIPASE, BLOOD: Lipase: 10 U/L — ABNORMAL LOW (ref 11–59)

## 2012-12-03 MED ORDER — CALCIUM CARBONATE-VITAMIN D 500-200 MG-UNIT PO TABS
2.0000 | ORAL_TABLET | Freq: Two times a day (BID) | ORAL | Status: DC
  Administered 2012-12-03 – 2012-12-04 (×2): 2 via ORAL
  Filled 2012-12-03 (×3): qty 2

## 2012-12-03 MED ORDER — SODIUM CHLORIDE 0.9 % IV SOLN: 250.0000 mL | INTRAVENOUS | Status: DC | PRN

## 2012-12-03 MED ORDER — GABAPENTIN 300 MG PO CAPS
300.0000 mg | ORAL_CAPSULE | Freq: Two times a day (BID) | ORAL | Status: DC
  Administered 2012-12-03 – 2012-12-04 (×3): 300 mg via ORAL
  Filled 2012-12-03 (×5): qty 1

## 2012-12-03 MED ORDER — HEPARIN SODIUM (PORCINE) 5000 UNIT/ML IJ SOLN: 5000.0000 [IU] | Freq: Three times a day (TID) | INTRAMUSCULAR | Status: DC

## 2012-12-03 MED ORDER — HYDROCODONE-ACETAMINOPHEN 5-325 MG PO TABS
1.0000 | ORAL_TABLET | Freq: Four times a day (QID) | ORAL | Status: DC | PRN
  Administered 2012-12-03: 1 via ORAL
  Administered 2012-12-03: 2 via ORAL
  Administered 2012-12-03: 1 via ORAL
  Filled 2012-12-03 (×2): qty 2

## 2012-12-03 MED ORDER — TIOTROPIUM BROMIDE MONOHYDRATE 18 MCG IN CAPS
18.0000 ug | ORAL_CAPSULE | Freq: Every day | RESPIRATORY_TRACT | Status: DC
  Administered 2012-12-04: 18 ug via RESPIRATORY_TRACT
  Filled 2012-12-03: qty 5

## 2012-12-03 MED ORDER — SODIUM CHLORIDE 0.9 % IJ SOLN
3.0000 mL | Freq: Two times a day (BID) | INTRAMUSCULAR | Status: DC
  Administered 2012-12-03: 3 mL via INTRAVENOUS

## 2012-12-03 MED ORDER — NICOTINE 21 MG/24HR TD PT24
21.0000 mg | MEDICATED_PATCH | Freq: Every day | TRANSDERMAL | Status: DC
  Administered 2012-12-03 – 2012-12-04 (×2): 21 mg via TRANSDERMAL
  Filled 2012-12-03 (×2): qty 1

## 2012-12-03 MED ORDER — ATORVASTATIN CALCIUM 40 MG PO TABS
40.0000 mg | ORAL_TABLET | Freq: Every day | ORAL | Status: DC
  Administered 2012-12-03: 40 mg via ORAL
  Filled 2012-12-03 (×2): qty 1

## 2012-12-03 MED ORDER — LISINOPRIL 10 MG PO TABS
10.0000 mg | ORAL_TABLET | Freq: Every day | ORAL | Status: DC
  Administered 2012-12-03: 10 mg via ORAL
  Filled 2012-12-03 (×2): qty 1

## 2012-12-03 MED ORDER — ONDANSETRON HCL 4 MG/2ML IJ SOLN
4.0000 mg | Freq: Once | INTRAMUSCULAR | Status: AC
  Administered 2012-12-03: 4 mg via INTRAVENOUS
  Filled 2012-12-03: qty 2

## 2012-12-03 MED ORDER — MORPHINE SULFATE ER 15 MG PO TBCR
15.0000 mg | EXTENDED_RELEASE_TABLET | Freq: Two times a day (BID) | ORAL | Status: DC
  Administered 2012-12-03 – 2012-12-04 (×3): 15 mg via ORAL
  Filled 2012-12-03 (×3): qty 1

## 2012-12-03 MED ORDER — AQUAPHOR EX OINT
TOPICAL_OINTMENT | Freq: Two times a day (BID) | CUTANEOUS | Status: DC | PRN
  Filled 2012-12-03: qty 50

## 2012-12-03 MED ORDER — SODIUM CHLORIDE 0.9 % IV SOLN
INTRAVENOUS | Status: AC
  Administered 2012-12-03: 17:00:00 via INTRAVENOUS

## 2012-12-03 MED ORDER — INSULIN DETEMIR 100 UNIT/ML ~~LOC~~ SOLN
30.0000 [IU] | Freq: Every day | SUBCUTANEOUS | Status: DC
  Administered 2012-12-03 – 2012-12-04 (×2): 30 [IU] via SUBCUTANEOUS
  Filled 2012-12-03 (×2): qty 0.3

## 2012-12-03 MED ORDER — ENOXAPARIN SODIUM 100 MG/ML ~~LOC~~ SOLN
90.0000 mg | Freq: Once | SUBCUTANEOUS | Status: AC
  Administered 2012-12-03: 90 mg via SUBCUTANEOUS
  Filled 2012-12-03: qty 1

## 2012-12-03 MED ORDER — FUROSEMIDE 80 MG PO TABS
80.0000 mg | ORAL_TABLET | Freq: Two times a day (BID) | ORAL | Status: DC
  Administered 2012-12-03 – 2012-12-04 (×2): 80 mg via ORAL
  Filled 2012-12-03 (×4): qty 1

## 2012-12-03 MED ORDER — ALPRAZOLAM 0.5 MG PO TABS
0.5000 mg | ORAL_TABLET | Freq: Two times a day (BID) | ORAL | Status: DC | PRN
  Administered 2012-12-03 – 2012-12-04 (×3): 0.5 mg via ORAL
  Filled 2012-12-03 (×3): qty 1

## 2012-12-03 MED ORDER — MOMETASONE FURO-FORMOTEROL FUM 100-5 MCG/ACT IN AERO: 2.0000 | INHALATION_SPRAY | Freq: Two times a day (BID) | RESPIRATORY_TRACT | Status: DC

## 2012-12-03 MED ORDER — SODIUM CHLORIDE 0.9 % IV BOLUS (SEPSIS)
1000.0000 mL | Freq: Once | INTRAVENOUS | Status: AC
  Administered 2012-12-03: 1000 mL via INTRAVENOUS

## 2012-12-03 MED ORDER — ENOXAPARIN SODIUM 100 MG/ML ~~LOC~~ SOLN
90.0000 mg | Freq: Two times a day (BID) | SUBCUTANEOUS | Status: DC
  Administered 2012-12-04: 90 mg via SUBCUTANEOUS
  Filled 2012-12-03 (×3): qty 1

## 2012-12-03 MED ORDER — FLUTICASONE PROPIONATE 50 MCG/ACT NA SUSP
2.0000 | Freq: Every day | NASAL | Status: DC
  Administered 2012-12-03 – 2012-12-04 (×2): 2 via NASAL
  Filled 2012-12-03: qty 16

## 2012-12-03 MED ORDER — POTASSIUM CHLORIDE CRYS ER 20 MEQ PO TBCR
20.0000 meq | EXTENDED_RELEASE_TABLET | Freq: Two times a day (BID) | ORAL | Status: DC
  Administered 2012-12-03 – 2012-12-04 (×2): 20 meq via ORAL
  Filled 2012-12-03 (×3): qty 1

## 2012-12-03 MED ORDER — INSULIN DETEMIR 100 UNIT/ML FLEXPEN: 30.0000 [IU] | PEN_INJECTOR | Freq: Two times a day (BID) | SUBCUTANEOUS | Status: DC

## 2012-12-03 MED ORDER — HYDROMORPHONE HCL PF 1 MG/ML IJ SOLN
1.0000 mg | Freq: Once | INTRAMUSCULAR | Status: AC
  Administered 2012-12-03: 1 mg via INTRAVENOUS
  Filled 2012-12-03: qty 1

## 2012-12-03 MED ORDER — MOMETASONE FURO-FORMOTEROL FUM 100-5 MCG/ACT IN AERO
2.0000 | INHALATION_SPRAY | Freq: Two times a day (BID) | RESPIRATORY_TRACT | Status: DC
  Administered 2012-12-03 – 2012-12-04 (×2): 2 via RESPIRATORY_TRACT
  Filled 2012-12-03: qty 8.8

## 2012-12-03 MED ORDER — MUPIROCIN CALCIUM 2 % EX CREA
TOPICAL_CREAM | Freq: Every day | CUTANEOUS | Status: DC
  Administered 2012-12-03 – 2012-12-04 (×2): via TOPICAL
  Filled 2012-12-03 (×2): qty 15

## 2012-12-03 MED ORDER — ALOE VESTA 2-N-1 PROTECTIVE EX OINT
TOPICAL_OINTMENT | Freq: Two times a day (BID) | CUTANEOUS | Status: DC | PRN
Start: 2012-12-03 — End: 2012-12-03

## 2012-12-03 MED ORDER — WARFARIN - PHARMACIST DOSING INPATIENT: Freq: Every day | Status: DC

## 2012-12-03 MED ORDER — WARFARIN SODIUM 5 MG PO TABS
5.0000 mg | ORAL_TABLET | Freq: Once | ORAL | Status: AC
  Administered 2012-12-03: 5 mg via ORAL
  Filled 2012-12-03: qty 1

## 2012-12-03 MED ORDER — FENTANYL CITRATE 0.05 MG/ML IJ SOLN
50.0000 ug | INTRAMUSCULAR | Status: DC | PRN
  Administered 2012-12-03 (×2): 50 ug via INTRAVENOUS
  Filled 2012-12-03 (×2): qty 2

## 2012-12-03 MED ORDER — ONDANSETRON HCL 4 MG/2ML IJ SOLN
4.0000 mg | Freq: Four times a day (QID) | INTRAMUSCULAR | Status: DC | PRN
  Administered 2012-12-03 – 2012-12-04 (×2): 4 mg via INTRAVENOUS
  Filled 2012-12-03 (×2): qty 2

## 2012-12-03 MED ORDER — CALCIUM CARB-CHOLECALCIFEROL 600-800 MG-UNIT PO TABS: 2.0000 | ORAL_TABLET | Freq: Two times a day (BID) | ORAL | Status: DC

## 2012-12-03 MED ORDER — PANTOPRAZOLE SODIUM 40 MG PO TBEC
40.0000 mg | DELAYED_RELEASE_TABLET | Freq: Every day | ORAL | Status: DC
  Administered 2012-12-03 – 2012-12-04 (×2): 40 mg via ORAL
  Filled 2012-12-03 (×2): qty 1

## 2012-12-03 MED ORDER — FERROUS GLUCONATE 324 (38 FE) MG PO TABS
324.0000 mg | ORAL_TABLET | Freq: Two times a day (BID) | ORAL | Status: DC
  Administered 2012-12-03 – 2012-12-04 (×2): 324 mg via ORAL
  Filled 2012-12-03 (×4): qty 1

## 2012-12-03 MED ORDER — MORPHINE SULFATE ER 15 MG PO TBCR: 15.0000 mg | EXTENDED_RELEASE_TABLET | Freq: Two times a day (BID) | ORAL | Status: DC

## 2012-12-03 MED ORDER — SERTRALINE HCL 100 MG PO TABS
100.0000 mg | ORAL_TABLET | Freq: Every day | ORAL | Status: DC
  Administered 2012-12-03 – 2012-12-04 (×2): 100 mg via ORAL
  Filled 2012-12-03 (×2): qty 1

## 2012-12-03 MED ORDER — ALBUTEROL SULFATE HFA 108 (90 BASE) MCG/ACT IN AERS
2.0000 | INHALATION_SPRAY | Freq: Four times a day (QID) | RESPIRATORY_TRACT | Status: DC | PRN
  Filled 2012-12-03: qty 6.7

## 2012-12-03 MED ORDER — INSULIN ASPART 100 UNIT/ML ~~LOC~~ SOLN
0.0000 [IU] | SUBCUTANEOUS | Status: DC
  Administered 2012-12-03: 2 [IU] via SUBCUTANEOUS
  Administered 2012-12-03: 3 [IU] via SUBCUTANEOUS
  Administered 2012-12-04: 2 [IU] via SUBCUTANEOUS
  Administered 2012-12-04: 3 [IU] via SUBCUTANEOUS

## 2012-12-03 MED ORDER — SODIUM CHLORIDE 0.9 % IJ SOLN
3.0000 mL | Freq: Two times a day (BID) | INTRAMUSCULAR | Status: DC
  Administered 2012-12-04: 3 mL via INTRAVENOUS

## 2012-12-03 MED ORDER — SODIUM CHLORIDE 0.9 % IJ SOLN: 3.0000 mL | INTRAMUSCULAR | Status: DC | PRN

## 2012-12-03 NOTE — H&P (Signed)
  Date: 12/03/2012  Patient name: [name]  Medical record number: [id number]  Date of birth: [birth year]   I have seen and evaluated [name] and discussed their care with the Residency Team.   Assessment and Plan: I have seen and evaluated the patient as outlined above. I agree with the formulated Assessment and Plan as detailed in the residents' admission note, with the following changes:   62 yo F with hx PAF and ? CHF (last EF 55-60%), DM2 (for ? Yrs), and L hip wound for last 3 weeks. Wound started as blisters and has progressed since.  Now with diarrhea for last 3 days, up to 10BM/day. No blood, no mucous. Has felt hot, had chills but no fever. Has not been able to take solid food for last 24h. No sick exposures, no travel, no unusual food exposures.   PMHx: COPD, Tobacco use, DM2, hyperlipidemia, Depression, bioprosthetic MV replacement ([id number])  All: oxycontin, tramadol, lorazepam.   Soc: current smoker, no ETOH.  ROS: headaches. No vision change. + neuropathy. No dizziness. Missed yesterdays dose of coumadin. See HPI.   Filed Vitals:   12/03/12 1346  BP: 116/34  Pulse: 87  Temp: 97.7 F (36.5 C)  Resp: 20  Eyes: EOMI, PERRL Mouth- without lesion. Neck: nontender, no LAN.  Chest: CTA CV: RRR, distant Abd: Bs+, soft, non-tender.  Skin: dime sized (with ~ 1/2 cm adjacent lesion) and quarter sized scabs on L flank. There is no fluctuance or tendnerness.   Labs of note: C diff (-). Cr 1.06.  WBC 12.5 INR 1.43  Gastroenteritis Dehydration Bioprosthetic MV, anticoagulation Rash DM2 ? CHF  Would: Hydrate as she tolerates Send stool pathogens panel, looking for viral gastroenteritis. Stool Cx as well.  Improve her anti-coagulation status.  Diabetic control, reduce insulin while improving diet.  Her recent skin bx suggested drug reaction. Appreciate wound care eval.  TTE 7-14 showed nl EF 55-60%, mild LVH. Will not repeat at this point.   Campbell Riches, MD 10/24/20142:34 PM

## 2012-12-03 NOTE — Progress Notes (Signed)
ANTICOAGULATION CONSULT NOTE - Initial Consult  Pharmacy Consult for Lovenox and Coumadin Indication: Hx MVR and Afib with subtherapeutic INR  Allergies  Allergen Reactions  . Lorazepam     Patient's sister noted that ativan caused the patient to become extremely confused during hospitalization 09/2010  . Oxycontin [Oxycodone] Other (See Comments)    headache  . Tramadol Hcl     REACTION: swelling    Patient Measurements: Height: _0  (157.5 cm) Weight: 194 lb 7.1 oz (88.2 kg) IBW/kg (Calculated) : 50.1  Vital Signs: Temp: 97.7 F (36.5 C) (10/24 1346) Temp src: Oral (10/24 0430) BP: 116/34 mmHg (10/24 1346) Pulse Rate: 87 (10/24 1346)  Labs:  Recent Labs  12/03/12 0429 12/03/12 1310  HGB 12.4  --   HCT 36.8  --   PLT 238  --   LABPROT  --  17.1*  INR  --  1.43  CREATININE 1.06  --     Estimated Creatinine Clearance: 56 ml/min (by C-G formula based on Cr of 1.06).   Medical History: Past Medical History  Diagnosis Date  . COPD (chronic obstructive pulmonary disease) with emphysema     PFTs 02/2012: FEV1 0.92 (40%), ratio 69, 27% increase in FEV1 with BD, TLC 91%, severe airtrapping, DLCO49% On chronic home O2. Pulmonary rehab referral 05/2012   . Moderate to severe pulmonary hypertension     2014 TEE w PA peak pressure 46 mmHg, s/p MV replacement   . Tobacco abuse 07/28/2012  . Type 2 diabetes mellitus with diabetic neuropathy   . Hyperlipidemia LDL goal < 100 11/20/2005  . Carotid artery stenosis     s/p right endarterectomy (06/2010) Carotid US (07/2010):  Left: Moderate-to-severe (60-79%) calcific and non-calcific plaque origin and proximal ICA and ECA   . Mitral stenosis     s/p Mitral valve replacement with a 27-mm pericardial porcine valve (Medtronic Mosaic valve, serial #[id number] on 09/20/10, Dr. Prescott Gum)   . Chronic congestive heart failure with left ventricular diastolic dysfunction 7/93/9030  . Depression 11/19/2005  . Anxiety 07/24/2010  .  Fibromyalgia 08/29/2010  . Obstructive sleep apnea     Nocturnal polysomnography (06/2009): Moderate sleep apnea/ hypopnea syndrome , AHI 17.8 per hour with nonpositional hypopneas. CPAP titration to 12 CWP, AHI 2.4 per hour. On nocturnal CPAP via a small resMed Quattro full-face mask with heated humidifier.   . Obesity, Class II, BMI 35-39.9 10/23/2011  . Clear cell renal cell carcinoma 07/21/2011    s/p cryoablation of left RCC in 09/2011 by [name]. Followed by [name]  [facility]) .    Marland Kitchen Adenomatous polyps 05/14/2011    Colonoscopy (05/2011): 4 mm adenomatous polyp excised endoscopically Colonoscopy (02/2002): Adenomatous polyp excised endoscopically   . Gastroesophageal reflux disease   . Internal hemorrhoids 08/04/2012  . Osteoporosis     DEXA (12/09/2011): L-spine T -3.7, left hip T -1.4 DEXA (12/2004): L-spine T -2.6, left hip -0.1   . Allergic rhinitis 06/01/2012  . Bilateral cataracts 08/04/2012    Visually insignificant   . Chronic constipation 02/03/2011  . Chronic venous insufficiency 08/04/2012  . Paroxysmal atrial fibrillation 10/22/2010    s/p Left atrial maze procedure for paroxysmal atrial fibrillation on 09/20/2010 by Dr Prescott Gum.  Subsequent splenic infarct, decision was made to re-anticoagulate with coumadin, likely life-long as this is the most likely cause of the splenic infarct.   . Chronic low back pain 10/06/2012    Medications:  Prescriptions prior to admission  Medication Sig Dispense  Refill  . albuterol (PROVENTIL HFA;VENTOLIN HFA) 108 (90 BASE) MCG/ACT inhaler Inhale 2 puffs into the lungs every 6 (six) hours as needed. For wheeze or shortness of breath  3 Inhaler  3  . alendronate (FOSAMAX) 70 MG tablet Take 70 mg by mouth every 7 (seven) days. Takes on wednesdays      . ALPRAZolam (XANAX) 1 MG tablet 0.5 mg by mouth up to twice daily as needed for anxiety. AND 15m at bedtime for sleep  60 tablet  5  . Calcium Carb-Cholecalciferol 600-800 MG-UNIT TABS  Take 2 tablets by mouth 2 (two) times daily.      . chlorpheniramine (CHLOR-TRIMETON) 4 MG tablet Take 1 tablet (4 mg total) by mouth 3 (three) times daily as needed for allergies.  90 tablet  11  . docusate sodium (COLACE) 50 MG capsule Take 100 mg by mouth every morning. Can Alternate with  senokot      . doxepin (ZONALON) 5 % cream Apply topically 3 (three) times daily.  30 g  0  . ferrous gluconate (FERGON) 324 MG tablet Take 1 tablet (324 mg total) by mouth 2 (two) times daily with a meal.  120 tablet  3  . fluticasone (FLONASE) 50 MCG/ACT nasal spray Place 2 sprays into the nose daily.      . Fluticasone-Salmeterol (ADVAIR DISKUS) 250-50 MCG/DOSE AEPB Inhale 1 puff into the lungs every 12 (twelve) hours.  3 each  3  . furosemide (LASIX) 80 MG tablet Take 80-120 mg by mouth 2 (two) times daily. Take 1 and 1/2 tablets every morning and take 1 tablet every evening      . gabapentin (NEURONTIN) 300 MG capsule Take 300 mg by mouth 2 (two) times daily.      .Marland KitchenHYDROcodone-acetaminophen (NORCO/VICODIN) 5-325 MG per tablet Take 1-2 tablets by mouth every 6 (six) hours as needed for pain.  180 tablet  0  . insulin aspart (NOVOLOG FLEXPEN) 100 UNIT/ML injection Take 18 units with meals and also use SSI with meals in addition to the 18 units of insulin  15 pen  3  . Insulin Detemir (LEVEMIR FLEXPEN) 100 UNIT/ML SOPN Inject 0.36 mLs (36 Units total) into the skin 2 (two) times daily  30 mL  11  . lisinopril (PRINIVIL,ZESTRIL) 10 MG tablet Take 1 tablet (10 mg total) by mouth daily.  90 tablet  3  . morphine (MS CONTIN) 15 MG 12 hr tablet Take 1 tablet (15 mg total) by mouth every 12 (twelve) hours.  60 tablet  0  . nystatin cream (MYCOSTATIN) Apply topically 2 (two) times daily.  30 g  11  . omeprazole (PRILOSEC) 40 MG capsule Take 1 capsule (40 mg total) by mouth daily.  90 capsule  3  . petrolatum-hydrophilic-aloe vera (ALOE VESTA) ointment Apply topically 2 (two) times daily as needed for wound care.   226 g  11  . potassium chloride SA (K-DUR,KLOR-CON) 20 MEQ tablet Take 2 tablets (40 mEq total) by mouth daily.  180 tablet  3  . rosuvastatin (CRESTOR) 20 MG tablet Take 1 tablet (20 mg total) by mouth daily with supper.  30 tablet  5  . sertraline (ZOLOFT) 100 MG tablet Take 1 tablet (100 mg total) by mouth daily.  90 tablet  3  . tiotropium (SPIRIVA) 18 MCG inhalation capsule Place 1 capsule (18 mcg total) into inhaler and inhale daily with breakfast.  90 capsule  0  . warfarin (COUMADIN) 5 MG tablet Take 2.5-5 mg  by mouth daily. 2.33m daily except 596mon Saturdays      . ACCU-CHEK FASTCLIX LANCETS MISC 1 each by Other route 3 (three) times daily before meals. Dx code 250.02 insulin requiring  102 each  12  . glucose blood (BAYER CONTOUR NEXT TEST) test strip Check blood sugar 3 times a day dx code 250.02 insulin requiring  100 each  12  . Insulin Pen Needle (B-D UF III MINI PEN NEEDLES) 31G X 5 MM MISC DX: 250.02 Use to inject insulin 5 times daily.  200 each  11  . Insulin Syringe-Needle U-100 (INSULIN SYRINGE .3CC/31GX5/16") 31G X 5/16" 0.3 ML MISC 1 Syringe by Does not apply route 2 (two) times daily at 10 AM and 5 PM.  100 each  5  . Spacer/Aero-Holding Chambers (AEROCHAMBER MV) inhaler Use as instructed with inhalers.  1 each  0    Assessment: 62yof on Coumadin PTA for hx MVR (porcine) and Afib. Admit INR (1.43) is subtherapeutic. Pharmacy has been consulted to start Lovenox bridging with Coumadin therapy. Patient reports PTA regimen was Coumadin 2.48m69maily except 48mg10m Saturdays - last dose on 10/22. - H/H and Plts wnl - No significant bleeding reported - Wt 88.2kg, CrCl 56ml1m  Goal of Therapy:  INR 2-3 Anti-Xa level 0.6-1.2 units/ml 4hrs after LMWH dose given Monitor platelets by anticoagulation protocol: Yes   Plan:  1. Lovenox 90mg 44mg/kg60mQ q12h 2. Coumadin 48mg po 57m today 3. Daily INR, CBC q72hr while on Lovenox  Dulaney,Earleen [id number],3:01  PM

## 2012-12-03 NOTE — ED Provider Notes (Signed)
CSN: [id number]     Arrival date & time 12/03/12  0409 History   First MD Initiated Contact with Patient 12/03/12 0440     Chief Complaint  Patient presents with  . Diarrhea   (Consider location/radiation/quality/duration/timing/severity/associated sxs/prior Treatment) HPI History provided by patient. Diarrhea for last 3 days with intermittent abdominal cramping. No blood in stools. Some nausea but no vomiting. No recent antibiotics. No recent hospitalization. Patient states she lives at home alone and does not get out much, no known sick contacts. She started taking Imodium last night without relief and called EMS today for persistent symptoms. She denies any history of same. No known bad food ingestions. No new medications. She called her primary care physician tonight and was referred to the emergency department. No recent travel.  Past Medical History  Diagnosis Date  . COPD (chronic obstructive pulmonary disease) with emphysema     PFTs 02/2012: FEV1 0.92 (40%), ratio 69, 27% increase in FEV1 with BD, TLC 91%, severe airtrapping, DLCO49% On chronic home O2. Pulmonary rehab referral 05/2012   . Moderate to severe pulmonary hypertension     2014 TEE w PA peak pressure 46 mmHg, s/p MV replacement   . Tobacco abuse 07/28/2012  . Type 2 diabetes mellitus with diabetic neuropathy   . Hyperlipidemia LDL goal < 100 11/20/2005  . Carotid artery stenosis     s/p right endarterectomy (06/2010) Carotid US (07/2010):  Left: Moderate-to-severe (60-79%) calcific and non-calcific plaque origin and proximal ICA and ECA   . Mitral stenosis     s/p Mitral valve replacement with a 27-mm pericardial porcine valve (Medtronic Mosaic valve, serial #[id number] on 09/20/10, Dr. Prescott Gum)   . Chronic congestive heart failure with left ventricular diastolic dysfunction 7/98/9211  . Depression 11/19/2005  . Anxiety 07/24/2010  . Fibromyalgia 08/29/2010  . Obstructive sleep apnea     Nocturnal polysomnography  (06/2009): Moderate sleep apnea/ hypopnea syndrome , AHI 17.8 per hour with nonpositional hypopneas. CPAP titration to 12 CWP, AHI 2.4 per hour. On nocturnal CPAP via a small resMed Quattro full-face mask with heated humidifier.   . Obesity, Class II, BMI 35-39.9 10/23/2011  . Clear cell renal cell carcinoma 07/21/2011    s/p cryoablation of left RCC in 09/2011 by [name]. Followed by [name]  [facility]) .    Marland Kitchen Adenomatous polyps 05/14/2011    Colonoscopy (05/2011): 4 mm adenomatous polyp excised endoscopically Colonoscopy (02/2002): Adenomatous polyp excised endoscopically   . Gastroesophageal reflux disease   . Internal hemorrhoids 08/04/2012  . Osteoporosis     DEXA (12/09/2011): L-spine T -3.7, left hip T -1.4 DEXA (12/2004): L-spine T -2.6, left hip -0.1   . Allergic rhinitis 06/01/2012  . Bilateral cataracts 08/04/2012    Visually insignificant   . Chronic constipation 02/03/2011  . Chronic venous insufficiency 08/04/2012  . Paroxysmal atrial fibrillation 10/22/2010    s/p Left atrial maze procedure for paroxysmal atrial fibrillation on 09/20/2010 by Dr Prescott Gum.  Subsequent splenic infarct, decision was made to re-anticoagulate with coumadin, likely life-long as this is the most likely cause of the splenic infarct.   . Chronic low back pain 10/06/2012   Past Surgical History  Procedure Laterality Date  . Orif clavicle fracture  01/2004    by Thana Farr. [name], M.D for Right clavicle nonunion.  . Tonsillectomy    . Carotid endarterectomy  07/04/2010    right, by [name] for asymptomatic right carotid artery stenosis  . Lipoma exision  08/2005  occipital lipoma 1.5cm - by Dr. [name]  . Hysteroscopy with endometrial ablation  06/2001    for persistent post-menopausal bleeding // by S. [name], M.D.  . Mitral valve replacement  09/20/10     with a 27-mm pericardial porcine valve (Medtronic Mosaic valve, serial #[id number]). 09/20/10, Dr Prescott Gum  . Left atrial maze  procedure  09/20/10    for paroxysmal atrial fibrillation (Dr. Prescott Gum)  . Placement of left chest tube.  09/24/2010    Dr Prescott Gum  . Cardiac valve replacement  Aug. 2012  . Colonoscopy  05/12/2011    performed by Dr. Michail Sermon. Showing small internal hemorrhoids, single tubular adenoma polyp  . Esophagogastroduodenoscopy  05/12/2011    performed by Dr. Michail Sermon. Negative for ulcerations, biopsy negative for evidence of celiac sprue  . Tubal ligation    . Eye surgery    . Fracture surgery Right     clavicle   Family History  Problem Relation Age of Onset  . Peptic Ulcer Disease Father   . Heart attack Father 2    Died of MI at age 91  . Heart attack Brother 41    Died of MI at age 81  . Obesity Brother   . Pneumonia Mother   . Healthy Sister   . Lupus Daughter   . Obsessive Compulsive Disorder Daughter    History  Substance Use Topics  . Smoking status: Current Every Day Smoker -- 1.00 packs/day for 50 years    Types: Cigarettes    Last Attempt to Quit: 07/12/2010  . Smokeless tobacco: Never Used  . Alcohol Use: No   OB History   Grav Para Term Preterm Abortions TAB SAB Ect Mult Living                 Review of Systems  Constitutional: Negative for fever and chills.  Eyes: Negative for visual disturbance.  Respiratory: Negative for shortness of breath.   Cardiovascular: Negative for chest pain.  Gastrointestinal: Positive for diarrhea. Negative for blood in stool and abdominal distention.  Genitourinary: Negative for dysuria.  Musculoskeletal: Negative for back pain, neck pain and neck stiffness.  Skin: Negative for rash.  Neurological: Negative for headaches.  All other systems reviewed and are negative.    Allergies  Lorazepam; Oxycontin; and Tramadol hcl  Home Medications   Current Outpatient Rx  Name  Route  Sig  Dispense  Refill  . ACCU-CHEK FASTCLIX LANCETS MISC   Other   1 each by Other route 3 (three) times daily before meals. Dx code 250.02  insulin requiring   102 each   12   . albuterol (PROVENTIL HFA;VENTOLIN HFA) 108 (90 BASE) MCG/ACT inhaler   Inhalation   Inhale 2 puffs into the lungs every 6 (six) hours as needed. For wheeze or shortness of breath   3 Inhaler   3   . alendronate (FOSAMAX) 70 MG tablet   Oral   Take 70 mg by mouth every 7 (seven) days. Takes on wednesdays         . ALPRAZolam (XANAX) 1 MG tablet      0.5 mg by mouth up to twice daily as needed for anxiety. AND 59m at bedtime for sleep   60 tablet   5   . Calcium Carb-Cholecalciferol 600-800 MG-UNIT TABS   Oral   Take 2 tablets by mouth 2 (two) times daily.         . chlorpheniramine (CHLOR-TRIMETON) 4 MG tablet  Oral   Take 1 tablet (4 mg total) by mouth 3 (three) times daily as needed for allergies.   90 tablet   11   . docusate sodium (COLACE) 50 MG capsule   Oral   Take 100 mg by mouth every morning. Can Alternate with  senokot         . doxepin (ZONALON) 5 % cream   Topical   Apply topically 3 (three) times daily.   30 g   0   . EXPIRED: ferrous gluconate (FERGON) 324 MG tablet   Oral   Take 1 tablet (324 mg total) by mouth 2 (two) times daily with a meal.   120 tablet   3   . fluticasone (FLONASE) 50 MCG/ACT nasal spray      USE 2 SPRAYS INTO NOSE DAILY FOR NASAL CONGESTION   16 g   5   . Fluticasone-Salmeterol (ADVAIR DISKUS) 250-50 MCG/DOSE AEPB   Inhalation   Inhale 1 puff into the lungs every 12 (twelve) hours.   3 each   3   . furosemide (LASIX) 80 MG tablet      TAKE 1 AND 1/2 TABLET BY MOUTH IN THE MORNING AND TAKE 1 TABLET BY MOUTH IN THE EVENING   75 tablet   11   . gabapentin (NEURONTIN) 300 MG capsule   Oral   Take 300 mg by mouth 2 (two) times daily.         Marland Kitchen glucose blood (BAYER CONTOUR NEXT TEST) test strip      Check blood sugar 3 times a day dx code 250.02 insulin requiring   100 each   12   . HYDROcodone-acetaminophen (NORCO/VICODIN) 5-325 MG per tablet   Oral   Take 1-2  tablets by mouth every 6 (six) hours as needed for pain.   180 tablet   0     Please monthly with no early refills.   . insulin aspart (NOVOLOG FLEXPEN) 100 UNIT/ML injection      Take 18 units with meals and also use SSI with meals in addition to the 18 units of insulin   15 pen   3   . Insulin Detemir (LEVEMIR FLEXPEN) 100 UNIT/ML SOPN      Inject 0.36 mLs (36 Units total) into the skin 2 (two) times daily   30 mL   11   . insulin detemir (LEVEMIR) 100 UNIT/ML injection   Subcutaneous   Inject 0.36 mLs (36 Units total) into the skin 2 (two) times daily.   30 mL   11   . Insulin Pen Needle (B-D UF III MINI PEN NEEDLES) 31G X 5 MM MISC      DX: 250.02 Use to inject insulin 5 times daily.   200 each   11   . Insulin Syringe-Needle U-100 (INSULIN SYRINGE .3CC/31GX5/16") 31G X 5/16" 0.3 ML MISC   Does not apply   1 Syringe by Does not apply route 2 (two) times daily at 10 AM and 5 PM.   100 each   5   . lisinopril (PRINIVIL,ZESTRIL) 10 MG tablet   Oral   Take 1 tablet (10 mg total) by mouth daily.   90 tablet   3   . morphine (MS CONTIN) 15 MG 12 hr tablet   Oral   Take 1 tablet (15 mg total) by mouth every 12 (twelve) hours.   60 tablet   0   . nystatin cream (MYCOSTATIN)   Topical   Apply topically  2 (two) times daily.   30 g   11   . omeprazole (PRILOSEC) 40 MG capsule   Oral   Take 1 capsule (40 mg total) by mouth daily.   90 capsule   3   . petrolatum-hydrophilic-aloe vera (ALOE VESTA) ointment   Topical   Apply topically 2 (two) times daily as needed for wound care.   226 g   11   . potassium chloride SA (K-DUR,KLOR-CON) 20 MEQ tablet   Oral   Take 2 tablets (40 mEq total) by mouth daily.   180 tablet   3   . rosuvastatin (CRESTOR) 20 MG tablet   Oral   Take 1 tablet (20 mg total) by mouth daily with supper.   30 tablet   5   . sertraline (ZOLOFT) 100 MG tablet   Oral   Take 1 tablet (100 mg total) by mouth daily.   90 tablet   3    . Spacer/Aero-Holding Chambers (AEROCHAMBER MV) inhaler      Use as instructed with inhalers.   1 each   0   . tiotropium (SPIRIVA) 18 MCG inhalation capsule   Inhalation   Place 1 capsule (18 mcg total) into inhaler and inhale daily with breakfast.   90 capsule   0   . warfarin (COUMADIN) 5 MG tablet      Take 51m TUES, THUR and SAT and 2.547mthe other days.          BP 127/56  Pulse 94  Temp(Src) 98.7 F (37.1 C) (Oral)  Ht 5' 2" (1.575 m)  Wt 194 lb (87.998 kg)  BMI 35.47 kg/m2  SpO2 99% Physical Exam  Constitutional: She is oriented to person, place, and time. She appears well-developed and well-nourished.  HENT:  Head: Normocephalic and atraumatic.  Dry mucous membranes  Eyes: EOM are normal. Pupils are equal, round, and reactive to light.  Neck: Neck supple.  Cardiovascular: Normal rate, regular rhythm and intact distal pulses.   Pulmonary/Chest: Effort normal and breath sounds normal. No respiratory distress.  Abdominal: Soft. She exhibits no distension. There is no tenderness. There is no rebound and no guarding.  No tenderness with deep palpation throughout, active bowel sounds  Musculoskeletal: Normal range of motion. She exhibits no edema.  Neurological: She is alert and oriented to person, place, and time.  Skin: Skin is warm and dry. No rash noted.    ED Course  Procedures (including critical care time) Labs Review Labs Reviewed  CBC - Abnormal; Notable for the following:    WBC 12.5 (*)    All other components within normal limits  COMPREHENSIVE METABOLIC PANEL  LIPASE, BLOOD   Imaging Review No results found.  EKG Interpretation   None      7:39 AM PT feeling weak after IVFs, requesting admit, C diff pending D/w IM resdient on call, will evaluate bedside, second L IVFs ordered and pain medications repeated  MDM  Dx: diarrhea, generalized weakness, dehydration  IV fluids Labs reviewed Serial evaluations MED admit     BrTeressa LowerMD 12/04/12 [id number]

## 2012-12-03 NOTE — Discharge Summary (Signed)
Name: [name] MRN: [id number] DOB: [birth year] 62 y.o. PCP: Karren Cobble, MD  Date of Admission: 12/03/2012  4:09 AM Date of Discharge: 12/03/2012 Attending Physician: Campbell Riches, MD  Discharge Diagnosis: Principal Problem:   Diarrhea Active Problems:   Depression   Type 2 diabetes mellitus with diabetic neuropathy   Chronic congestive heart failure with left ventricular diastolic dysfunction   Paroxysmal atrial fibrillation   Long term (current) use of anticoagulants   OSA on CPAP  Discharge Medications:   Medication List    ASK your doctor about these medications       ACCU-CHEK FASTCLIX LANCETS Misc  1 each by Other route 3 (three) times daily before meals. Dx code 250.02 insulin requiring     AEROCHAMBER MV inhaler  Use as instructed with inhalers.     albuterol 108 (90 BASE) MCG/ACT inhaler  Commonly known as:  PROVENTIL HFA;VENTOLIN HFA  Inhale 2 puffs into the lungs every 6 (six) hours as needed. For wheeze or shortness of breath     alendronate 70 MG tablet  Commonly known as:  FOSAMAX  Take 70 mg by mouth every 7 (seven) days. Takes on wednesdays     ALPRAZolam 1 MG tablet  Commonly known as:  XANAX  0.5 mg by mouth up to twice daily as needed for anxiety. AND 68m at bedtime for sleep     Calcium Carb-Cholecalciferol 600-800 MG-UNIT Tabs  Take 2 tablets by mouth 2 (two) times daily.     chlorpheniramine 4 MG tablet  Commonly known as:  CHLOR-TRIMETON  Take 1 tablet (4 mg total) by mouth 3 (three) times daily as needed for allergies.     docusate sodium 50 MG capsule  Commonly known as:  COLACE  Take 100 mg by mouth every morning. Can Alternate with  senokot     doxepin 5 % cream  Commonly known as:  ZONALON  Apply topically 3 (three) times daily.     ferrous gluconate 324 MG tablet  Commonly known as:  FERGON  Take 1 tablet (324 mg total) by mouth 2 (two) times daily with a meal.     fluticasone 50 MCG/ACT nasal spray    Commonly known as:  FLONASE  Place 2 sprays into the nose daily.     Fluticasone-Salmeterol 250-50 MCG/DOSE Aepb  Commonly known as:  ADVAIR DISKUS  Inhale 1 puff into the lungs every 12 (twelve) hours.     furosemide 80 MG tablet  Commonly known as:  LASIX  Take 80-120 mg by mouth 2 (two) times daily. Take 1 and 1/2 tablets every morning and take 1 tablet every evening     gabapentin 300 MG capsule  Commonly known as:  NEURONTIN  Take 300 mg by mouth 2 (two) times daily.     glucose blood test strip  Commonly known as:  BAYER CONTOUR NEXT TEST  Check blood sugar 3 times a day dx code 250.02 insulin requiring     HYDROcodone-acetaminophen 5-325 MG per tablet  Commonly known as:  NORCO/VICODIN  Take 1-2 tablets by mouth every 6 (six) hours as needed for pain.     insulin aspart 100 UNIT/ML injection  Commonly known as:  NOVOLOG FLEXPEN  Take 18 units with meals and also use SSI with meals in addition to the 18 units of insulin     Insulin Detemir 100 UNIT/ML Sopn  Commonly known as:  LEVEMIR FLEXPEN  Inject 0.36 mLs (36 Units total) into the  skin 2 (two) times daily     Insulin Pen Needle 31G X 5 MM Misc  Commonly known as:  B-D UF III MINI PEN NEEDLES  - DX: 250.02  - Use to inject insulin 5 times daily.     INSULIN SYRINGE .3CC/31GX5/16" 31G X 5/16" 0.3 ML Misc  1 Syringe by Does not apply route 2 (two) times daily at 10 AM and 5 PM.     lisinopril 10 MG tablet  Commonly known as:  PRINIVIL,ZESTRIL  Take 1 tablet (10 mg total) by mouth daily.     morphine 15 MG 12 hr tablet  Commonly known as:  MS CONTIN  Take 1 tablet (15 mg total) by mouth every 12 (twelve) hours.     nystatin cream  Commonly known as:  MYCOSTATIN  Apply topically 2 (two) times daily.     omeprazole 40 MG capsule  Commonly known as:  PRILOSEC  Take 1 capsule (40 mg total) by mouth daily.     petrolatum-hydrophilic-aloe vera ointment  Apply topically 2 (two) times daily as needed for wound  care.     potassium chloride SA 20 MEQ tablet  Commonly known as:  K-DUR,KLOR-CON  Take 2 tablets (40 mEq total) by mouth daily.     rosuvastatin 20 MG tablet  Commonly known as:  CRESTOR  Take 1 tablet (20 mg total) by mouth daily with supper.     sertraline 100 MG tablet  Commonly known as:  ZOLOFT  Take 1 tablet (100 mg total) by mouth daily.     tiotropium 18 MCG inhalation capsule  Commonly known as:  SPIRIVA  Place 1 capsule (18 mcg total) into inhaler and inhale daily with breakfast.     warfarin 5 MG tablet  Commonly known as:  COUMADIN  Take 2.5-5 mg by mouth daily. 2.53m daily except 569mon Saturdays        Disposition and follow-up:   [name]  At the hospital follow up visit please address:  1.  Lasix dosage (decreased to 4014mid at hospital d/c due to diarrhea with hypotension although it seems her BP appears to run consistently low from reading previous notes)  2.  Labs / imaging needed at time of follow-up: BMP  3.  Pending labs/ test needing follow-up: GI Pathogen Panel  Follow-up Appointments:   Discharge Instructions:  Future Appointments Provider Department Dept Phone   12/06/2012 1:00 PM [facility] [phone]/11/2012 8:00 AM [facility] [phone]/11/2012 8:30 AM BriLelon PerlaD [facility] [phone] 068012/06/2012 10:45 AM LawKarren CobbleD [phone]/03/2013 9:30 AM [facility]athee Del[facility]D [facility] [phone]/05/2013 1:00 PM [facility] [place] [facility] [address] [phone]/05/2013 2:00 PM SuzViann FishP [phone]   Consultations:  None  Procedures Performed:  No results found.  2D Echo:  None  Cardiac Cath: None  Admission HPI: Patient is [age] lady with PMH of T2DM, COPD, chronic venous insufficiency, long term Coumadin use for A fib, obstructive sleep apnea and CHF, hx of MV replacement, who presents for diarrhea.  Patient reports that she started having diarrhea 3 days ago. He has more than  10 bowel movement per day with watery stools, without blood or mucus in stools. It is associated with cramping abdominal pain. She has chills and feels hot. She also has nausea, but no vomiting. No recent antibiotics. No recent hospitalization. No hx of recent traveling or sick contact. She treated herself with Imodium without help. She has generalized weakness. She could not sleep well due to frequent diarrhea. Her symptoms has been getting worse since it started. She could not eat any food since yesterday. She called EMS for persistent symptoms.  Patient also reports having headache since last night. She attributes her headache to poor sleep in the past several days. The headache started gradually. It is located at occipital and posterior neck areas. The headache is dull, moderate and non-radiating. She does not have weakness, numbness or tingling sensations in her extremities. No slurring speech. No vision or hearing loss.   Hospital Course by problem list: Principal Problem:   Diarrhea Active Problems:   Depression   Type 2 diabetes mellitus with diabetic neuropathy   Chronic congestive heart failure with left ventricular diastolic dysfunction   Paroxysmal atrial fibrillation   Long term (current) use of anticoagulants   OSA on CPAP   #: Diarrhea: Patient has a watery diarrhea for 3 days. She does not have fever. No blood or mucus in stools. It is likely due to noninvasive infection, such as viral gastroenteritis. Other differential diagnoses include, but less likely, C. difficile colitis (less likely, given no recent antibiotics use), pancreatitis (unlikely, given negative lipase),  parasite infection (less likely, given the duration is still short). She denies any new or recent changes in medications. Ebola is unlikely given no hx of recent traveling and generally mild symptoms. Patient was hemodynamically stable. No tachycardia. Her electrolytes on BMP were normal. Pt was admitted to a telemetry bed for observation. We checked orthostatics and treated symptomatically with zofran for nausea. We d/c colace given diarrhea. We put her on a clear liquid diet and did not start IVF initially given her h/o "CHF" but decreased her lasix dose. C diff was negative. Pathogen panel is pending. We did give her 14m of NS over the course of 12 hours due to hypotension. Her BP came up somewhat but was still low and therefore she was given a 1L NS bolus prior to discharge. BMP in the am was relatively normal with the exception of glucose and GFR.  During her hospital course she was not orthostatic, but she continued to have hypotension with BP in the 90's/40's despite fluids.  Upon discharge, her lasix dose was decreased to 435mbid given her hypotension.  She will need close follow up to determine the correct lasix dosage.  #: ?Diastolic Dysfunction: Patient carries diagnoses of "CHF". Her recent 2-D echo on 09/02/12 showed EF 55-60%, but there is no comment on diastolic function in report. Patient had 1+ leg edema no JVD and clear lung auscultation bilaterally. She received Lasix 120 mg in the morning and 80 mg in the evening at home. Her body weight increased slightly from 192 on 11/19/12 to 194 Lbs.  Her leg edema is partially from chronic venous insufficiency. She is clinically euvolemic.  We decreased her lasix dose to 80 mg bid given her ongoing diarrhea. We monitored volume status and pt became hypotension and was given 754mS over 12 hours. There was not significant improvement in her hypotension and she was given a NS bolus upon discharge and BP came up to the 90's/40's.  She will need follow up as  an outpatient to determine the correct lasix dose.  We continued KCl 20 mg bid.   #: Paroxysmal A. fib: Heart rate is 87/min on admission. Patient does not have palpitations. She is on coumadin at home but missed a dose the prior day. Her INR was 1.43 which is sub-therapeutic. We bridged with lovenox and continued coumadin per pharmacy. She was discharged on her normal coumadin dose.  #: COPD: Stable. Patient is on O2 1 L [place] at home. She has mild shortness of breath, which is her baseline. She does not have cough or chest pain. Her lung auscultation is clear bilaterally. We continued home inhalers: Albuterol, Spiriva. We switched Advair to [facility] inhaler.  #: HTN: BP is 103/68 on admission. We continued home lisinopril 10 mg daily.  #: Left hip wound: She was seen in clinic on 11/19/12. She was given a referral to wound care center. Her wound is healing well with minimal drainage; no signs of infection. We consulted wound care and continued home pain meds  #: DM-II: Recent HA1c was 6.7 on 11/18/12. Patient is taking Levemir 36 U bid and Nonolog with meal and SSI at home. We decreased Levemir to 30 U bid given her decreased oral intake and placed on a moderate SSI.  She was discharged on her previous home regimen.  #: Headache: It started gradually, indicating unlikely to have subarachnoid hemorrhage. Patient does not have focal neurologic findings, indicating unlikely for stroke. She has tenderness in the muscles over her posterior neck, indicating musculoskeletal etiology. Patient attributes her headache to poor sleep recently. Patient is on pain medications for her hip wound at home which we continued.  #: Tobacco abuse: Patient has been smoking 1.5 pack a day for 38 years. I did counseling for the importance of quitting smoking. She would like to consider to cut down her smoking. We counseled her regarding quitting smoking and provided nicotine patch.  #: OSA: on CPAP at home which we continued.    Discharge Vitals:   BP 116/34  Pulse 87  Temp(Src) 97.7 F (36.5 C) (Oral)  Resp 20  Ht _0  (1.575 m)  Wt 88.2 kg (194 lb 7.1 oz)  BMI 35.56 kg/m2  SpO2 96%  Discharge Labs:  Results for orders placed during the hospital encounter of 12/03/12 (from the past 24 hour(s))  CBC     Status: Abnormal   Collection Time    12/03/12  4:29 AM      Result Value Range   WBC 12.5 (*) 4.0 - 10.5 K/uL   RBC 3.95  3.87 - 5.11 MIL/uL   Hemoglobin 12.4  12.0 - 15.0 g/dL   HCT 36.8  36.0 - 46.0 %   MCV 93.2  78.0 - 100.0 fL   MCH 31.4  26.0 - 34.0 pg   MCHC 33.7  30.0 - 36.0 g/dL   RDW 14.7  11.5 - 15.5 %   Platelets 238  150 - 400 K/uL  COMPREHENSIVE METABOLIC PANEL     Status: Abnormal   Collection Time    12/03/12  4:29 AM      Result Value Range   Sodium 138  135 - 145 mEq/L   Potassium 4.0  3.5 - 5.1 mEq/L   Chloride 100  96 - 112 mEq/L   CO2 26  19 - 32 mEq/L   Glucose, Bld 169 (*) 70 - 99 mg/dL   BUN 16  6 - 23 mg/dL  Creatinine, Ser 1.06  0.50 - 1.10 mg/dL   Calcium 9.2  8.4 - 10.5 mg/dL   Total Protein 6.8  6.0 - 8.3 g/dL   Albumin 3.5  3.5 - 5.2 g/dL   AST 13  0 - 37 U/L   ALT 13  0 - 35 U/L   Alkaline Phosphatase 47  39 - 117 U/L   Total Bilirubin 0.2 (*) 0.3 - 1.2 mg/dL   GFR calc non Af Amer 55 (*) >90 mL/min   GFR calc Af Amer 63 (*) >90 mL/min  LIPASE, BLOOD     Status: Abnormal   Collection Time    12/03/12  4:29 AM      Result Value Range   Lipase 10 (*) 11 - 59 U/L  CLOSTRIDIUM DIFFICILE BY PCR     Status: None   Collection Time    12/03/12 10:46 AM      Result Value Range   C difficile by pcr NEGATIVE  NEGATIVE  GLUCOSE, CAPILLARY     Status: Abnormal   Collection Time    12/03/12 11:57 AM      Result Value Range   Glucose-Capillary 183 (*) 70 - 99 mg/dL  MAGNESIUM     Status: None   Collection Time    12/03/12  1:10 PM      Result Value Range   Magnesium 2.0  1.5 - 2.5 mg/dL  PROTIME-INR     Status: Abnormal   Collection Time    12/03/12   1:10 PM      Result Value Range   Prothrombin Time 17.1 (*) 11.6 - 15.2 seconds   INR 1.43  0.00 - 1.49    Signed: Michail Jewels, MD 12/03/2012, 3:03 PM   Time Spent on Discharge: 45 minutes Services Ordered on Discharge: None Equipment Ordered on Discharge: None

## 2012-12-03 NOTE — ED Notes (Signed)
Admit MD at bedside

## 2012-12-03 NOTE — Consult Note (Addendum)
WOC wound consult note Reason for Consult: Consult requested for left hip and back wounds.  Pt states they have been there for 3 weeks and started as blisters which ruptured.  She has been followed as an outpatient at a clinic and they performed a biopsy and arranged for her to be seen at the outpatient wound care center.  She was admitted to the hospital before she had the appointment at the [facility] for evaluation of these sites.  She uses some topical cream which was ordered as an outpatient but does not recall the name.  She states her physician thinks the blisters may have been related to a medication, but is unsure of the etiology. Wound type: Left hip .5X.5cm 100% dry eschar.  No odor or drainage.  Left lower back .3X.3cm and .1X.1cm with same appearance. Pressure Ulcer POA: These are NOT pressure ulcers. Dressing procedure/placement/frequency: Bactroban to promote moist healing and foam dressing to protect sites.  Pt can obtain further plan of care at the outpatient wound care center after discharge. Please re-consult if further assistance is needed.  Thank-you,  Julien Girt MSN, [place], [place], [place], [place]

## 2012-12-03 NOTE — H&P (Signed)
Hospital Admission Note Date: 12/03/2012  Patient name: [name] Medical record number: [id number] Date of birth: [birth year] Age: 62 y.o. Gender: female PCP: Karren Cobble, MD  Medical Service: IMTS  Attending physician: Dr. Johnnye Sima  Internal Medicine Teaching Service Contact Information  Weekday Hours (7AM-5PM):  1st Contact: Dr. Michail Jewels  Pager: [phone] 2nd Contact: Dr. Michail Sermon        Pager:[phone]  ** If no return call within 15 minutes (after trying both pagers listed above), please call after hours pagers.  After 5 pm or weekends: 1st Contact: Pager: [phone] 2nd Contact: Pager: [phone]  Chief Complaint: Diarrhea  History of Present Illness:  Patient is 62 yo lady with PMH of T2DM, COPD, chronic venous insufficiency, long term Coumadin use for A fib, obstructive sleep apnea and CHF, hx of MV replacement,  who presents for diarrhea.  Patient reports that she started having diarrhea 3 days ago. He has more than 10 bowel movement per day with watery stools, without blood or mucus in stools. It is associated with cramping abdominal pain. She has chills and feels hot. She also has nausea, but no vomiting. No recent antibiotics. No recent hospitalization. No hx of recent traveling or sick contact.  She treated herself with Imodium without help. She has generalized weakness. She could not sleep well due to frequent diarrhea. Her symptoms has been getting worse since it started. She could not eat any food since yesterday. She called EMS for persistent symptoms.  Patient also reports having headache since last night. She attributes her headache to poor sleep in the past several days. The headache started gradually. It is located at occipital and posterior neck areas. The headache is dull, moderate and non-radiating. She does not have weakness, numbness or tingling sensations in her extremities. No slurring speech. No vision or hearing loss.  ROS:  Denies cough, chest  pain, SOB, constipation, dysuria, urgency, frequency, hematuria.   Meds: Current Outpatient Rx  Name  Route  Sig  Dispense  Refill  . albuterol (PROVENTIL HFA;VENTOLIN HFA) 108 (90 BASE) MCG/ACT inhaler   Inhalation   Inhale 2 puffs into the lungs every 6 (six) hours as needed. For wheeze or shortness of breath   3 Inhaler   3   . alendronate (FOSAMAX) 70 MG tablet   Oral   Take 70 mg by mouth every 7 (seven) days. Takes on wednesdays         . ALPRAZolam (XANAX) 1 MG tablet      0.5 mg by mouth up to twice daily as needed for anxiety. AND 70m at bedtime for sleep   60 tablet   5   . Calcium Carb-Cholecalciferol 600-800 MG-UNIT TABS   Oral   Take 2 tablets by mouth 2 (two) times daily.         . chlorpheniramine (CHLOR-TRIMETON) 4 MG tablet   Oral   Take 1 tablet (4 mg total) by mouth 3 (three) times daily as needed for allergies.   90 tablet   11   . docusate sodium (COLACE) 50 MG capsule   Oral   Take 100 mg by mouth every morning. Can Alternate with  senokot         . doxepin (ZONALON) 5 % cream   Topical   Apply topically 3 (three) times daily.   30 g   0   . ferrous gluconate (FERGON) 324 MG tablet   Oral   Take 1 tablet (324 mg total) by mouth  2 (two) times daily with a meal.   120 tablet   3   . fluticasone (FLONASE) 50 MCG/ACT nasal spray   Nasal   Place 2 sprays into the nose daily.         . Fluticasone-Salmeterol (ADVAIR DISKUS) 250-50 MCG/DOSE AEPB   Inhalation   Inhale 1 puff into the lungs every 12 (twelve) hours.   3 each   3   . furosemide (LASIX) 80 MG tablet   Oral   Take 80-120 mg by mouth 2 (two) times daily. Take 1 and 1/2 tablets every morning and take 1 tablet every evening         . gabapentin (NEURONTIN) 300 MG capsule   Oral   Take 300 mg by mouth 2 (two) times daily.         Marland Kitchen HYDROcodone-acetaminophen (NORCO/VICODIN) 5-325 MG per tablet   Oral   Take 1-2 tablets by mouth every 6 (six) hours as needed for  pain.   180 tablet   0     Please monthly with no early refills.   . insulin aspart (NOVOLOG FLEXPEN) 100 UNIT/ML injection      Take 18 units with meals and also use SSI with meals in addition to the 18 units of insulin   15 pen   3   . Insulin Detemir (LEVEMIR FLEXPEN) 100 UNIT/ML SOPN      Inject 0.36 mLs (36 Units total) into the skin 2 (two) times daily   30 mL   11   . lisinopril (PRINIVIL,ZESTRIL) 10 MG tablet   Oral   Take 1 tablet (10 mg total) by mouth daily.   90 tablet   3   . morphine (MS CONTIN) 15 MG 12 hr tablet   Oral   Take 1 tablet (15 mg total) by mouth every 12 (twelve) hours.   60 tablet   0   . nystatin cream (MYCOSTATIN)   Topical   Apply topically 2 (two) times daily.   30 g   11   . omeprazole (PRILOSEC) 40 MG capsule   Oral   Take 1 capsule (40 mg total) by mouth daily.   90 capsule   3   . petrolatum-hydrophilic-aloe vera (ALOE VESTA) ointment   Topical   Apply topically 2 (two) times daily as needed for wound care.   226 g   11   . potassium chloride SA (K-DUR,KLOR-CON) 20 MEQ tablet   Oral   Take 2 tablets (40 mEq total) by mouth daily.   180 tablet   3   . rosuvastatin (CRESTOR) 20 MG tablet   Oral   Take 1 tablet (20 mg total) by mouth daily with supper.   30 tablet   5   . sertraline (ZOLOFT) 100 MG tablet   Oral   Take 1 tablet (100 mg total) by mouth daily.   90 tablet   3   . tiotropium (SPIRIVA) 18 MCG inhalation capsule   Inhalation   Place 1 capsule (18 mcg total) into inhaler and inhale daily with breakfast.   90 capsule   0   . warfarin (COUMADIN) 5 MG tablet   Oral   Take 2.5-5 mg by mouth daily. Take 49m TUES, THUR and SAT and 2.524mthe other days.         . Marland KitchenCCU-CHEK FASTCLIX LANCETS MISC   Other   1 each by Other route 3 (three) times daily before meals. Dx code 250.02 insulin requiring  102 each   12   . glucose blood (BAYER CONTOUR NEXT TEST) test strip      Check blood sugar 3 times  a day dx code 250.02 insulin requiring   100 each   12   . Insulin Pen Needle (B-D UF III MINI PEN NEEDLES) 31G X 5 MM MISC      DX: 250.02 Use to inject insulin 5 times daily.   200 each   11   . Insulin Syringe-Needle U-100 (INSULIN SYRINGE .3CC/31GX5/16") 31G X 5/16" 0.3 ML MISC   Does not apply   1 Syringe by Does not apply route 2 (two) times daily at 10 AM and 5 PM.   100 each   5   . Spacer/Aero-Holding Chambers (AEROCHAMBER MV) inhaler      Use as instructed with inhalers.   1 each   0     Allergies: Allergies as of 12/03/2012 - Review Complete 12/03/2012  Allergen Reaction Noted  . Lorazepam  11/04/2010  . Oxycontin [oxycodone] Other (See Comments) 05/03/2012  . Tramadol hcl     Past Medical History  Diagnosis Date  . COPD (chronic obstructive pulmonary disease) with emphysema     PFTs 02/2012: FEV1 0.92 (40%), ratio 69, 27% increase in FEV1 with BD, TLC 91%, severe airtrapping, DLCO49% On chronic home O2. Pulmonary rehab referral 05/2012   . Moderate to severe pulmonary hypertension     2014 TEE w PA peak pressure 46 mmHg, s/p MV replacement   . Tobacco abuse 07/28/2012  . Type 2 diabetes mellitus with diabetic neuropathy   . Hyperlipidemia LDL goal < 100 11/20/2005  . Carotid artery stenosis     s/p right endarterectomy (06/2010) Carotid US (07/2010):  Left: Moderate-to-severe (60-79%) calcific and non-calcific plaque origin and proximal ICA and ECA   . Mitral stenosis     s/p Mitral valve replacement with a 27-mm pericardial porcine valve (Medtronic Mosaic valve, serial #[id number] on 09/20/10, Dr. Prescott Gum)   . Chronic congestive heart failure with left ventricular diastolic dysfunction 8/88/9169  . Depression 11/19/2005  . Anxiety 07/24/2010  . Fibromyalgia 08/29/2010  . Obstructive sleep apnea     Nocturnal polysomnography (06/2009): Moderate sleep apnea/ hypopnea syndrome , AHI 17.8 per hour with nonpositional hypopneas. CPAP titration to 12 CWP, AHI 2.4  per hour. On nocturnal CPAP via a small resMed Quattro full-face mask with heated humidifier.   . Obesity, Class II, BMI 35-39.9 10/23/2011  . Clear cell renal cell carcinoma 07/21/2011    s/p cryoablation of left RCC in 09/2011 by [name]. Followed by [name]  [facility]) .    Marland Kitchen Adenomatous polyps 05/14/2011    Colonoscopy (05/2011): 4 mm adenomatous polyp excised endoscopically Colonoscopy (02/2002): Adenomatous polyp excised endoscopically   . Gastroesophageal reflux disease   . Internal hemorrhoids 08/04/2012  . Osteoporosis     DEXA (12/09/2011): L-spine T -3.7, left hip T -1.4 DEXA (12/2004): L-spine T -2.6, left hip -0.1   . Allergic rhinitis 06/01/2012  . Bilateral cataracts 08/04/2012    Visually insignificant   . Chronic constipation 02/03/2011  . Chronic venous insufficiency 08/04/2012  . Paroxysmal atrial fibrillation 10/22/2010    s/p Left atrial maze procedure for paroxysmal atrial fibrillation on 09/20/2010 by Dr Prescott Gum.  Subsequent splenic infarct, decision was made to re-anticoagulate with coumadin, likely life-long as this is the most likely cause of the splenic infarct.   . Chronic low back pain 10/06/2012   Past Surgical History  Procedure Laterality Date  . Orif clavicle fracture  01/2004    by Thana Farr. [name], M.D for Right clavicle nonunion.  . Tonsillectomy    . Carotid endarterectomy  07/04/2010    right, by [name] for asymptomatic right carotid artery stenosis  . Lipoma exision  08/2005    occipital lipoma 1.5cm - by Dr. [name]  . Hysteroscopy with endometrial ablation  06/2001    for persistent post-menopausal bleeding // by S. [name], M.D.  . Mitral valve replacement  09/20/10     with a 27-mm pericardial porcine valve (Medtronic Mosaic valve, serial #[id number]). 09/20/10, Dr Prescott Gum  . Left atrial maze procedure  09/20/10    for paroxysmal atrial fibrillation (Dr. Prescott Gum)  . Placement of left chest tube.  09/24/2010    Dr Prescott Gum  .  Cardiac valve replacement  Aug. 2012  . Colonoscopy  05/12/2011    performed by Dr. Michail Sermon. Showing small internal hemorrhoids, single tubular adenoma polyp  . Esophagogastroduodenoscopy  05/12/2011    performed by Dr. Michail Sermon. Negative for ulcerations, biopsy negative for evidence of celiac sprue  . Tubal ligation    . Eye surgery    . Fracture surgery Right     clavicle   Family History  Problem Relation Age of Onset  . Peptic Ulcer Disease Father   . Heart attack Father 24    Died of MI at age 47  . Heart attack Brother 56    Died of MI at age 73  . Obesity Brother   . Pneumonia Mother   . Healthy Sister   . Lupus Daughter   . Obsessive Compulsive Disorder Daughter    History   Social History  . Marital Status: Divorced    Spouse Name: N/A    Number of Children: N/A  . Years of Education: N/A   Occupational History  . Not on file.   Social History Main Topics  . Smoking status: Current Every Day Smoker -- 1.00 packs/day for 50 years    Types: Cigarettes    Last Attempt to Quit: 07/12/2010  . Smokeless tobacco: Never Used  . Alcohol Use: No  . Drug Use: No  . Sexual Activity: No   Other Topics Concern  . Not on file   Social History Narrative   Lives alone in [facility] with 60 pound chow   Worked at [employer] for 18 years   No car    Review of Systems: Full 14-point review of systems otherwise negative except as noted above in HPI.  Physical Exam:   Filed Vitals:   12/03/12 0430 12/03/12 0530 12/03/12 0831 12/03/12 0930  BP: 127/56 103/68 91/40 103/54  Pulse: 94 87 85 85  Temp: 98.7 F (37.1 C)     TempSrc: Oral     Resp:   18   Height: _0  (1.575 m)     Weight: 194 lb (87.998 kg)     SpO2: 99% 100% 99% 97%    General: Not in acute distress,Dry mucous membranes. HEENT: PERRL, EOMI, no scleral icterus, No JVD or bruit. There is tenderness in the muscles over posterior neck area.  Cardiac: S1/S2, RRR (though patient  has diagnosis of A Fib), no murmur gallops or rubs Pulm: Good air movement bilaterally. Clear to auscultation bilaterally. No rales, wheezing, rhonchi or rubs. Abd: Soft, minimal tenderness to deep palpation. Nondistended, no rebound pain, no organomegaly, BS present Ext: 1+ pitting leg edema bilaterally.  2+DP/PT pulse bilaterally Musculoskeletal: No joint deformities, erythema, or stiffness, ROM full Skin: There is a small wound over left hip, proximately 1 X 1 cm in size, with lack eschar centrally, No draining pus or signs of infection. Pain to palpation to area.  Neuro: Alert and oriented X3, cranial nerves II-XII grossly intact, muscle strength 5/5 in all extremeties, sensation to light touch intact. Brachial reflex 2+ bilaterally.  Psych: Patient is not psychotic, no suicidal or hemocidal ideation.  Lab results: Basic Metabolic Panel:  Recent Labs  12/03/12 0429  NA 138  K 4.0  CL 100  CO2 26  GLUCOSE 169*  BUN 16  CREATININE 1.06  CALCIUM 9.2   Liver Function Tests:  Recent Labs  12/03/12 0429  AST 13  ALT 13  ALKPHOS 47  BILITOT 0.2*  PROT 6.8  ALBUMIN 3.5    Recent Labs  12/03/12 0429  LIPASE 10*   No results found for this basename: AMMONIA,  in the last 72 hours CBC:  Recent Labs  12/03/12 0429  WBC 12.5*  HGB 12.4  HCT 36.8  MCV 93.2  PLT 238   Cardiac Enzymes: No results found for this basename: CKTOTAL, CKMB, CKMBINDEX, TROPONINI,  in the last 72 hours BNP: No results found for this basename: PROBNP,  in the last 72 hours D-Dimer: No results found for this basename: DDIMER,  in the last 72 hours CBG:  Recent Labs  11/30/12 1447  GLUCAP 138*   Hemoglobin A1C: No results found for this basename: HGBA1C,  in the last 72 hours Fasting Lipid Panel: No results found for this basename: CHOL, HDL, LDLCALC, TRIG, CHOLHDL, LDLDIRECT,  in the last 72 hours Thyroid Function Tests: No results found for this basename: TSH, T4TOTAL, FREET4,  T3FREE, THYROIDAB,  in the last 72 hours Anemia Panel: No results found for this basename: VITAMINB12, FOLATE, FERRITIN, TIBC, IRON, RETICCTPCT,  in the last 72 hours Coagulation: No results found for this basename: LABPROT, INR,  in the last 72 hours Urine Drug Screen: Drugs of Abuse     Component Value Date/Time   LABOPIA POSITIVE* 03/27/2009 0133   COCAINSCRNUR NONE DETECTED 03/27/2009 0133   LABBENZ POSITIVE* 03/27/2009 0133   AMPHETMU NONE DETECTED 03/27/2009 0133   THCU NONE DETECTED 03/27/2009 0133   LABBARB  Value: NONE DETECTED        DRUG SCREEN FOR MEDICAL PURPOSES ONLY.  IF CONFIRMATION IS NEEDED FOR ANY PURPOSE, NOTIFY LAB WITHIN 5 DAYS.        LOWEST DETECTABLE LIMITS FOR URINE DRUG SCREEN Drug Class       Cutoff (ng/mL) Amphetamine      1000 Barbiturate      200 Benzodiazepine   563 Tricyclics       149 Opiates          300 Cocaine          300 THC              50 03/27/2009 0133    Alcohol Level: No results found for this basename: ETH,  in the last 72 hours Urinalysis: No results found for this basename: COLORURINE, APPERANCEUR, LABSPEC, PHURINE, GLUCOSEU, HGBUR, BILIRUBINUR, KETONESUR, PROTEINUR, UROBILINOGEN, NITRITE, LEUKOCYTESUR,  in the last 72 hours Misc. Labs:  Imaging results:  No results found.  Other results:  EKG:   Assessment & Plan by Problem: Principal Problem:   Diarrhea Active Problems:   Depression   Type 2 diabetes mellitus with diabetic neuropathy   Chronic  congestive heart failure with left ventricular diastolic dysfunction   Paroxysmal atrial fibrillation   Long term (current) use of anticoagulants   OSA on CPAP  #: Diarrhea:  Patient has a watery diarrhea for 3 days. She does not have fever. No blood or mucus in stools. It is likely due to noninvasive infection, such as viral enteritis. Other differential diagnoses include, but less likely, C. difficile colitis (less likely, given no recent antibiotics use), pancreatitis (unlikely, given  negative lipase), parasite infection (less likely, given the duration is still short). Ebola is unlikely given no hx of recent traveling and generally mild symptoms. Currently patient is hemodynamically stable. No tachycardia. Her electrolytes on BMP is normal.   - Will admit to telemetry bed for observation - check orthostatic status - treat symptomatically: zofran for nausea and abdominal pain control - Continue PPI - d/c colace - will put pt on clear diet - will not start IVF given her hx of CHF, but will decrease her lasix dose as blow. - pending C diff prn - f/u BMP for electrolytes  #: CHF: Patient carries diagnoses of CHF. Her recent 2-D echo on 09/02/12 show EF 55-60%, there is no comment on diastolic function in report. Currently patient has a 180 leg edema, but no JVD, her lung auscultation is clear bilaterally. She is currently taking Lasix 120 mg in the morning and 80 mg in the evening at home. Her body weight increased slightly from 192 on 11/19/12 to 194 Lbs today today. Her leg edema is at least partially from her chronic venous insufficiency. She is clinically euvolemic today.  -will decrease her lasix dose to 80 mg bid given her ongoing diarrhea.  -will monitor volume status carefully for further adjustment of diuretics -will continue KCl 20 mg bid -f/u BMP in AM.  #: Paroxysmal A. fib: Heart rate is 87/min on admission. Patient does not have palpitation. She is on Coumadin at home. INR is 2.4 today.  -will continue Coumadin per pharmacy.  #: COPD: Stable. Patient is using oxygen 1 L at home. She has mild shortness of breath, which is at her baseline. She does not have cough or chest pain. Her lung auscultation is clear bilaterally.  -will continue home inhalers: Albuterol, Spiriva,  -Switched Advair to [employer] inhaler.  #: HTN: bp is 103/68 on admission. -will continue home lisinopril 10 mg daily  #: Left hip wound:  She was seen in clinic on 11/19/12. She was given a  referral to wound care center. Her would looks healing well. No drainage. No signs of infection. -will consult to Wound Care.  -continue home pain meds  #: DM-II: Recent A1c was 6.7 on 11/18/12.  Patient is taking Levemir 36 U bid and Nonolog with meal and SSI at home -will decrease Levemir to 30 U bid given her decreased oral intake. -SSI: modeate  #: Headache:  It started gradually, indicating unlikely to have subarachnoid hemorrhage. Patient does not have focal neurologic findings, indicating  unlikely for stroke. She has tenderness in the muscles over her posterior neck, indicating musculoskeletal etiology. Patient attributes her headache to poor sleep recently.   -Patient is on pain medications for her Hip wound at home, will continue -will monitor closely, if get worse or develop focal neurologic deficit, would consider image.   #  F/E/N  -SL: -f/u BMP -Diet: clear diet  #: Tobacco abuse:  Patient has been smoking 1.5 pack a day for 38 years. I did counseling for the importance of  quitting smoking. She would like to consider to cut down her smoking.  -Nicotine patches.  #: OSA: on CPAP at home - continue CPAP  # DVT px: Heparin sq   Dispo: Disposition is deferred at this time, awaiting improvement of current medical problems. Anticipated discharge in approximately 1 to 2 day(s).   The patient does have a current PCP (KLIMA, LAWRENCE D, MD), therefore will be requiring OPC follow-up after discharge.   The patient does not have transportation limitations that hinder transportation to clinic appointments.  Signed:  Ivor Costa, MD PGY3, Internal Medicine Teaching Service Pager: [phone]  12/03/2012, 11:30 AM

## 2012-12-03 NOTE — Telephone Encounter (Signed)
The patient calls with horrible diarrhea for the last week or so, she is being treated for a wound. She thinks that was going okay but now is cramping and having bowel movements. She was having a lot of bowel movements all day yesterday. She denies any recent antibiotics but brief chart review indicates that she was on levaquin in March of this year. She further adds that she is unable to get out of the bathroom and is weak and not able to eat or drink anything for most of Thursday. Brief chart review indicates that her wound is on her hip and is not considered infectious at this time.   Concern for C dif due to watery nature and also for dehydration due to patient report of not eating or drinking since very early Thursday. She was advised to come to the emergency department. She did not feel able to drive and had no one else at home to take her so she will call EMS after she hangs up with me.   She is advised to seek medical attention at this time and she agrees.  Vertell Novak 3:40 AM 12/03/2012

## 2012-12-03 NOTE — Progress Notes (Signed)
Placed patient on CPAP via auto-mode with minimum pressure set at 4cm and maximum pressure set at 20cm. Oxygen set at 2lpm

## 2012-12-03 NOTE — Telephone Encounter (Signed)
See other note

## 2012-12-03 NOTE — ED Notes (Signed)
Unresolved diarrhea for 3 days. Taking imodium with no relief.  Called on call pcp and told to go to the ED.

## 2012-12-04 DIAGNOSIS — R197 Diarrhea, unspecified: Secondary | ICD-10-CM

## 2012-12-04 DIAGNOSIS — I959 Hypotension, unspecified: Secondary | ICD-10-CM

## 2012-12-04 LAB — GLUCOSE, CAPILLARY
Glucose-Capillary: 116 mg/dL — ABNORMAL HIGH (ref 70–99)
Glucose-Capillary: 105 mg/dL — ABNORMAL HIGH (ref 70–99)
Glucose-Capillary: 153 mg/dL — ABNORMAL HIGH (ref 70–99)
Glucose-Capillary: 134 mg/dL — ABNORMAL HIGH (ref 70–99)
Glucose-Capillary: 143 mg/dL — ABNORMAL HIGH (ref 70–99)

## 2012-12-04 LAB — BASIC METABOLIC PANEL
BUN: 11 mg/dL (ref 6–23)
CO2: 26 mEq/L (ref 19–32)
Calcium: 8.5 mg/dL (ref 8.4–10.5)
Chloride: 104 mEq/L (ref 96–112)
Creatinine, Ser: 1.06 mg/dL (ref 0.50–1.10)
GFR calc Af Amer: 63 mL/min — ABNORMAL LOW (ref >90)
GFR calc non Af Amer: 55 mL/min — ABNORMAL LOW (ref >90)
Glucose, Bld: 133 mg/dL — ABNORMAL HIGH (ref 70–99)
Potassium: 4.2 mEq/L (ref 3.5–5.1)
Sodium: 138 mEq/L (ref 135–145)

## 2012-12-04 LAB — HIV ANTIBODY (ROUTINE TESTING W REFLEX): HIV: NONREACTIVE

## 2012-12-04 LAB — CBC
HCT: 33.5 % — ABNORMAL LOW (ref 36.0–46.0)
Hemoglobin: 10.4 g/dL — ABNORMAL LOW (ref 12.0–15.0)
MCH: 29.9 pg (ref 26.0–34.0)
MCHC: 31 g/dL (ref 30.0–36.0)
MCV: 96.3 fL (ref 78.0–100.0)
Platelets: 197 10*3/uL (ref 150–400)
RBC: 3.48 MIL/uL — ABNORMAL LOW (ref 3.87–5.11)
RDW: 15.1 % (ref 11.5–15.5)
WBC: 6.5 10*3/uL (ref 4.0–10.5)

## 2012-12-04 LAB — PROTIME-INR
INR: 1.44 (ref 0.00–1.49)
Prothrombin Time: 17.2 seconds — ABNORMAL HIGH (ref 11.6–15.2)

## 2012-12-04 MED ORDER — MUPIROCIN CALCIUM 2 % EX CREA: TOPICAL_CREAM | Freq: Every day | CUTANEOUS | Status: DC

## 2012-12-04 MED ORDER — FUROSEMIDE 80 MG PO TABS: 40.0000 mg | ORAL_TABLET | Freq: Two times a day (BID) | ORAL | Status: DC

## 2012-12-04 MED ORDER — ZOLPIDEM TARTRATE 5 MG PO TABS
5.0000 mg | ORAL_TABLET | Freq: Once | ORAL | Status: DC
  Filled 2012-12-04: qty 1

## 2012-12-04 MED ORDER — SODIUM CHLORIDE 0.9 % IV BOLUS (SEPSIS)
1000.0000 mL | Freq: Once | INTRAVENOUS | Status: AC
  Administered 2012-12-04: 1000 mL via INTRAVENOUS

## 2012-12-04 NOTE — Progress Notes (Signed)
Discharge instructions reviewed with patient. All questions regarding medications answered. PIV removed. Tele box #[id number] returned to nurse's station. Pt to be discharged to home.  Aaron Edelman, [place]

## 2012-12-04 NOTE — Progress Notes (Signed)
Subjective: Pt looks good this morning and has experienced no further episodes of diarrhea. She is able to tolerate liquids without nausea or vomiting. She is able to be discharged today.   Objective: Vital signs in last 24 hours: Filed Vitals:   12/03/12 1818 12/03/12 2056 12/03/12 2059 12/04/12 0408  BP: 119/53  105/44 82/44  Pulse: 80  67 57  Temp:   97.8 F (36.6 C) 97.6 F (36.4 C)  TempSrc:   Oral Oral  Resp: _0 Height:      Weight:   89 kg (196 lb 3.4 oz)   SpO2: 98% 96% 98% 100%   Weight change: 0.202 kg (7.1 oz)  Intake/Output Summary (Last 24 hours) at 12/04/12 0916 Last data filed at 12/03/12 1838  Gross per 24 hour  Intake    340 ml  Output    125 ml  Net    215 ml   Constitutional: Vital signs reviewed.  Patient is a well-developed and well-nourished female in no acute distress and cooperative with exam.   Head: Normocephalic and atraumatic Eyes: PERRL, EOMI, conjunctivae normal, No scleral icterus.  Neck: Supple, Trachea midline .  Cardiovascular: RRR, S1 normal, S2 normal, no MRG, pulses symmetric and intact bilaterally Pulmonary/Chest: normal respiratory effort, CTAB, no wheezes, rales, or rhonchi Abdominal: Soft. Non-tender, non-distended, bowel sounds are normal, no masses, organomegaly, or guarding present.  Musculoskeletal: No joint deformities, erythema, or stiffness noted. Neurological: A&O x3, cranial nerve II-XII are grossly intact. Skin: Warm, dry with a left hip .5X.5cm 100% dry eschar. No odor or drainage. A couple of denuded areas on the left lower back .3X.3cm and .1X.1cm with same appearance.  Psychiatric: Normal mood and affect.   Lab Results: Basic Metabolic Panel:  Recent Labs Lab 12/03/12 0429 12/03/12 1310 12/04/12 0625  NA 138  --  138  K 4.0  --  4.2  CL 100  --  104  CO2 26  --  26  GLUCOSE 169*  --  133*  BUN 16  --  11  CREATININE 1.06  --  1.06  CALCIUM 9.2  --  8.5  MG  --  2.0  --    Liver Function  Tests:  Recent Labs Lab 12/03/12 0429  AST 13  ALT 13  ALKPHOS 47  BILITOT 0.2*  PROT 6.8  ALBUMIN 3.5    Recent Labs Lab 12/03/12 0429  LIPASE 10*   No results found for this basename: AMMONIA,  in the last 168 hours CBC:  Recent Labs Lab 12/03/12 0429 12/04/12 0625  WBC 12.5* 6.5  HGB 12.4 10.4*  HCT 36.8 33.5*  MCV 93.2 96.3  PLT 238 197   Cardiac Enzymes: No results found for this basename: CKTOTAL, CKMB, CKMBINDEX, TROPONINI,  in the last 168 hours BNP: No results found for this basename: PROBNP,  in the last 168 hours D-Dimer: No results found for this basename: DDIMER,  in the last 168 hours CBG:  Recent Labs Lab 11/30/12 1447 12/03/12 1157 12/03/12 1611 12/03/12 2046 12/04/12 0017  GLUCAP 138* 183* 172* 105* 116*   Hemoglobin A1C: No results found for this basename: HGBA1C,  in the last 168 hours Fasting Lipid Panel: No results found for this basename: CHOL, HDL, LDLCALC, TRIG, CHOLHDL, LDLDIRECT,  in the last 168 hours Thyroid Function Tests: No results found for this basename: TSH, T4TOTAL, FREET4, T3FREE, THYROIDAB,  in the last 168 hours Coagulation:  Recent Labs Lab 12/03/12 1310 12/04/12 0625  LABPROT 17.1* 17.2*  INR 1.43 1.44   Anemia Panel: No results found for this basename: VITAMINB12, FOLATE, FERRITIN, TIBC, IRON, RETICCTPCT,  in the last 168 hours Urine Drug Screen: Drugs of Abuse     Component Value Date/Time   LABOPIA POSITIVE* 03/27/2009 0133   COCAINSCRNUR NONE DETECTED 03/27/2009 0133   LABBENZ POSITIVE* 03/27/2009 0133   AMPHETMU NONE DETECTED 03/27/2009 0133   THCU NONE DETECTED 03/27/2009 0133   LABBARB  Value: NONE DETECTED        DRUG SCREEN FOR MEDICAL PURPOSES ONLY.  IF CONFIRMATION IS NEEDED FOR ANY PURPOSE, NOTIFY LAB WITHIN 5 DAYS.        LOWEST DETECTABLE LIMITS FOR URINE DRUG SCREEN Drug Class       Cutoff (ng/mL) Amphetamine      1000 Barbiturate      200 Benzodiazepine   947 Tricyclics       654 Opiates           300 Cocaine          300 THC              50 03/27/2009 0133    Alcohol Level: No results found for this basename: ETH,  in the last 168 hours Urinalysis: No results found for this basename: COLORURINE, APPERANCEUR, LABSPEC, PHURINE, GLUCOSEU, HGBUR, BILIRUBINUR, KETONESUR, PROTEINUR, UROBILINOGEN, NITRITE, LEUKOCYTESUR,  in the last 168 hours Misc. Labs:   Micro Results: Recent Results (from the past 240 hour(s))  CLOSTRIDIUM DIFFICILE BY PCR     Status: None   Collection Time    12/03/12 10:46 AM      Result Value Range Status   C difficile by pcr NEGATIVE  NEGATIVE Final   Studies/Results: No results found. Medications: I have reviewed the patient's current medications. Scheduled Meds: . atorvastatin  40 mg Oral q1800  . calcium-vitamin D  2 tablet Oral BID  . enoxaparin (LOVENOX) injection  90 mg Subcutaneous Q12H  . ferrous gluconate  324 mg Oral BID WC  . fluticasone  2 spray Each Nare Daily  . furosemide  80 mg Oral BID  . gabapentin  300 mg Oral BID  . insulin aspart  0-15 Units Subcutaneous Q4H  . insulin detemir  30 Units Subcutaneous Daily  . lisinopril  10 mg Oral Daily  . mometasone-formoterol  2 puff Inhalation BID  . morphine  15 mg Oral Q12H  . mupirocin cream   Topical Daily  . nicotine  21 mg Transdermal Daily  . pantoprazole  40 mg Oral Daily  . potassium chloride SA  20 mEq Oral BID  . sertraline  100 mg Oral Daily  . sodium chloride  3 mL Intravenous Q12H  . sodium chloride  3 mL Intravenous Q12H  . tiotropium  18 mcg Inhalation Q breakfast  . Warfarin - Pharmacist Dosing Inpatient   Does not apply q1800  . zolpidem  5 mg Oral Once   Continuous Infusions:  PRN Meds:.sodium chloride, albuterol, ALPRAZolam, HYDROcodone-acetaminophen, mineral oil-hydrophilic petrolatum, ondansetron, sodium chloride Assessment/Plan: Principal Problem:   Diarrhea Active Problems:   Depression   Type 2 diabetes mellitus with diabetic neuropathy   Chronic congestive  heart failure with left ventricular diastolic dysfunction   Paroxysmal atrial fibrillation   Long term (current) use of anticoagulants   OSA on CPAP  #: Diarrhea: Patient has a watery diarrhea for 3 days. She does not have fever. No blood or mucus in stools. It is likely due to noninvasive infection, such  as viral gastroenteritis. Other differential diagnoses include, but less likely, C. difficile colitis (less likely, given no recent antibiotics use), pancreatitis (unlikely, given negative lipase), parasite infection (less likely, given the duration is still short). She denies any new or recent changes in medications. Ebola is unlikely given no hx of recent traveling and generally mild symptoms. Currently patient is hemodynamically stable. No tachycardia. Her electrolytes on BMP is normal. Pt was admitted to a telemetry bed for observation. We will check orthostatics and treat symptomatically with zofran for nausea. We will d/c colace given diarrhea. We put her on a clear liquid diet and will gave her 46m/hr for 12 hours and decreased her lasix dose. C diff was negative. BMP in the am revealed no acute abnormalities. CBC revealed no leukocytosis. Stool pathogen panel is still pending and should be resulted tomorrow. Today, 12/04/12, pt has no further diarrhea and will be discharged.  #: ?Diastolic Dysfunction: Patient carries diagnoses of "CHF". Her recent 2-D echo on 09/02/12 show EF 55-60%, there is no comment on diastolic function in report. Currently patient has 1+ leg edema, but no JVD, her lung auscultation is clear bilaterally. She is currently taking Lasix 120 mg in the morning and 80 mg in the evening at home. Her body weight increased slightly from 192 on 11/19/12 to 194 Lbs today today. Her leg edema is partially from chronic venous insufficiency. She is clinically euvolemic today. We decreased her lasix dose to 80 mg bid given her ongoing diarrhea. We monitored volume status carefully for further  adjustment of diuretics. We continued KCl 20 mg bid and BMP this am showed no acute abnormalities.   #: Paroxysmal A. fib: Heart rate is 87/min on admission. Patient does not have palpitations. She is on Coumadin at home. INR is 1.43 today which is sub-therapeutic. We will bridge with lovenox and continue coumadin per pharmacy.  #: COPD: Stable. Patient is on O2 1 L [place] at home. She has mild shortness of breath, which is her baseline. She does not have cough or chest pain. Her lung auscultation is clear bilaterally. Continued home inhalers of Albuterol, Spiriva. We [facility].  #: HTN: BP is 103/68 on admission. We continued home lisinopril 10 mg daily. #: Left hip wound: She was seen in clinic on 11/19/12. She was given a referral to wound care center. Her wound is healing well with minimal drainage; no signs of infection. We consulted wound care and continued home pain meds. Wound care recommended bactroban to promote healing and foam dressing to protect the sites.  #: DM-II: Recent HA1c was 6.7 on 11/18/12. Patient is taking Levemir 36 U bid and Nonolog with meal and SSI at home. We will decrease Levemir to 30 U bid given her decreased oral intake. She was put on SSI moderate. #: Headache: It started gradually, indicating unlikely to have subarachnoid hemorrhage. Patient does not have focal neurologic findings, indicating unlikely for stroke. She has tenderness in the muscles over her posterior neck, indicating musculoskeletal etiology. Patient attributes her headache to poor sleep recently. Patient is on pain medications for her hip wound at home and we continued.   #: Tobacco abuse: Patient has been smoking 1.5 pack a day for 38 years. I did counseling for the importance of quitting smoking. She would like to consider to cut down her smoking. We provided a nicotine patch. #: OSA: on CPAP at home and continued CPAP.   Dispo: Patient has no further episodes of diarrhea and  can go  home today.  The patient does have a current PCP Karren Cobble, MD) and does need an [facility] hospital follow-up appointment after discharge.   .Services Needed at time of discharge: Y = Yes, Blank = No PT:   OT:   RN:   Equipment:   Other:     LOS: 1 day   Michail Jewels, MD 12/04/2012, 9:16 AM

## 2012-12-04 NOTE — Progress Notes (Signed)
  Date: 12/04/2012  Patient name: [name]  Medical record number: [id number]  Date of birth: 02-12-1950   This patient has been seen and the plan of care was discussed with the house staff. Please see their note for complete details. I concur with their findings with the following additions/corrections:  Her diarrhea has improved.  Her BP is her outstanding issue. Will try to decrease her lasix.  Stool pathogens panel pending.   Campbell Riches, MD 12/04/2012, 10:04 AM

## 2012-12-04 NOTE — Discharge Instructions (Signed)
Please take '40mg'$  of your lasix twice daily; (this would be 1/2 tablet twice daily) Please call the clinic on Monday to schedule a hospital follow-up appointment with Dr. Eppie Gibson  Please keep your follow-up appointment at the wound clinic

## 2012-12-06 ENCOUNTER — Encounter (HOSPITAL_BASED_OUTPATIENT_CLINIC_OR_DEPARTMENT_OTHER): Payer: Medicare Other | Attending: General Surgery

## 2012-12-06 DIAGNOSIS — L89209 Pressure ulcer of unspecified hip, unspecified stage: Secondary | ICD-10-CM | POA: Insufficient documentation

## 2012-12-06 DIAGNOSIS — L89109 Pressure ulcer of unspecified part of back, unspecified stage: Secondary | ICD-10-CM | POA: Insufficient documentation

## 2012-12-06 DIAGNOSIS — L8990 Pressure ulcer of unspecified site, unspecified stage: Secondary | ICD-10-CM | POA: Insufficient documentation

## 2012-12-06 DIAGNOSIS — L8992 Pressure ulcer of unspecified site, stage 2: Secondary | ICD-10-CM | POA: Insufficient documentation

## 2012-12-06 DIAGNOSIS — E119 Type 2 diabetes mellitus without complications: Secondary | ICD-10-CM | POA: Insufficient documentation

## 2012-12-06 LAB — GI PATHOGEN PANEL BY PCR, STOOL
C difficile toxin A/B: POSITIVE
Campylobacter by PCR: NEGATIVE
Cryptosporidium by PCR: NEGATIVE
E coli (ETEC) LT/ST: NEGATIVE
E coli (STEC): NEGATIVE
E coli 0157 by PCR: NEGATIVE
G lamblia by PCR: NEGATIVE
Norovirus GI/GII: NEGATIVE
Rotavirus A by PCR: NEGATIVE
Salmonella by PCR: NEGATIVE
Shigella by PCR: NEGATIVE

## 2012-12-07 ENCOUNTER — Telehealth: Payer: Self-pay | Admitting: Infectious Diseases

## 2012-12-07 NOTE — H&P (Signed)
[name], Horn NO.:  [id number]  MEDICAL RECORD NO.:  [id number]  LOCATION:                               FACILITY:  [facility]  PHYSICIAN:  [name], M.D.        DATE OF BIRTH:  03-10-1950  DATE OF ADMISSION:  12/03/2012 DATE OF DISCHARGE:  12/04/2012                             HISTORY & PHYSICAL   CHIEF COMPLAINT:  Sores to left hip.  HISTORY OF PRESENT ILLNESS:  This is a 62 year old female diabetic with COPD who developed a spontaneous wound on the left hip about 3 weeks ago.  This was followed by another wound.  The wound started as blisters.  The blisters broke down to an eschar.  This was biopsied and we are awaiting a report and the wounds are still present as small eschars.  PAST MEDICAL HISTORY:  Significant for type 2 diabetes, COPD, chronic venous insufficiency.  PAST SURGICAL HISTORY:  Valve replacement, open heart surgery, coronary heart surgery and shoulder surgery.  SOCIAL HISTORY:  Cigarettes, she continues to smoke more than a pack a day, in spite of the fact that she chronically uses oxygen.  ALCOHOL:  None.  ALLERGIES:  Lorazepam, OxyContin, and tramadol.  MEDICATIONS:  Xanax, albuterol, Fosamax, calcium, Chlor-[place], Colace, Flonase, Advair, Lasix, Neurontin, Vicodin, NovoLog insulin, lisinopril, MS Contin, Mycostatin ointment, Prilosec, K-Dur, Crestor, Zoloft, Spiriva and Coumadin.  REVIEW OF SYSTEMS:  Essentially as above.  PHYSICAL EXAMINATION:  VITAL SIGNS:  Temperature she appears afebrile, pulse is 76, respirations 18, blood pressure 118/74.  Glucose is 210. GENERAL APPEARANCE:  Well developed, well nourished. CHEST:  With very distant breath sounds. HEART:  Regular rhythm. EXTREMITIES:  On the left hip, there were 2 wounds, 1 is 0.9 x 1, 1 is 0.7 x 0.5.  Both are covered by a dark colored thin eschar which is too inherent for removal at present.  IMPRESSION:  Wounds of unknown cause.  They are not familiar  in appearance to me.  They did not appear to be insect bites, or pressure wounds or the usual diabetic wound, although the patient does have a symptomatic diabetes.  PLAN OF TREATMENT:  We will start with antibiotic ointment, and we will debride the eschars when they become less adherent. We will see her in 7 days.     [name], M.D.     RA/MEDQ  D:  12/06/2012  T:  12/07/2012  Job:  [id number]

## 2012-12-07 NOTE — Telephone Encounter (Signed)
Spoke with pt She had 1 formed, soft BM today, 1 yesterday. No fevers. Will defer tx of possible C diff at this point. She may be a carrier (up to 3% of population)

## 2012-12-07 NOTE — Telephone Encounter (Signed)
Pt with C diff+o n stool pathogen panel, her C diff PCR was (-).  Would treat pt with flagyl 579m TID for 10 days if still having diarrhea.

## 2012-12-08 IMAGING — CT CT HEAD W/O CM
1 series · 16 of 30 positions shown, 20 images · non-contrast
Comparison: None.

CLINICAL DATA: Dizziness and headache

CT HEAD WITHOUT CONTRAST
TECHNIQUE: Contiguous axial images were obtained from the base of
the skull through the vertex without contrast.

[Series 2: head routine 4.8 h37s · axial · 0.43mm/px · z∈[-129,+4]mm · 16 of 30 slices shown, 20 images]
[im 2/30  brain]
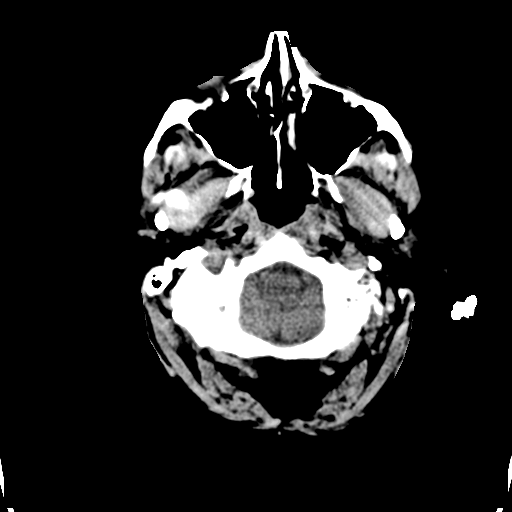
[im 2/30  bone]
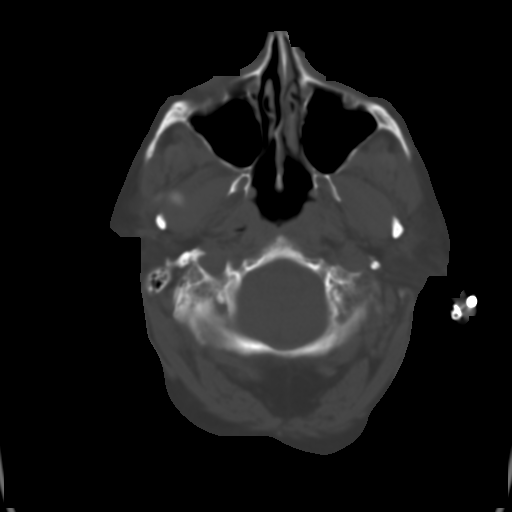
[im 4/30  brain]
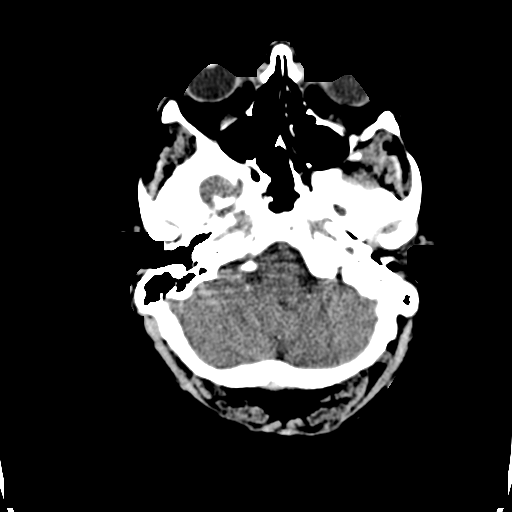
[im 6/30  brain]
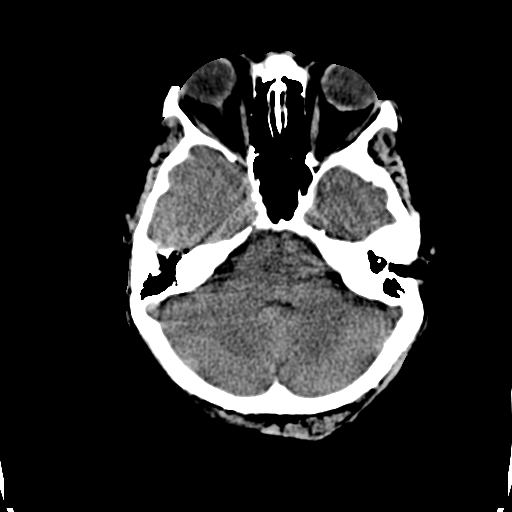
[im 8/30  brain]
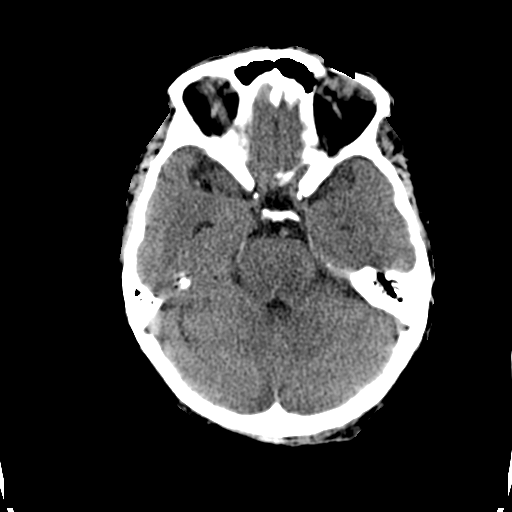
[im 9/30  brain]
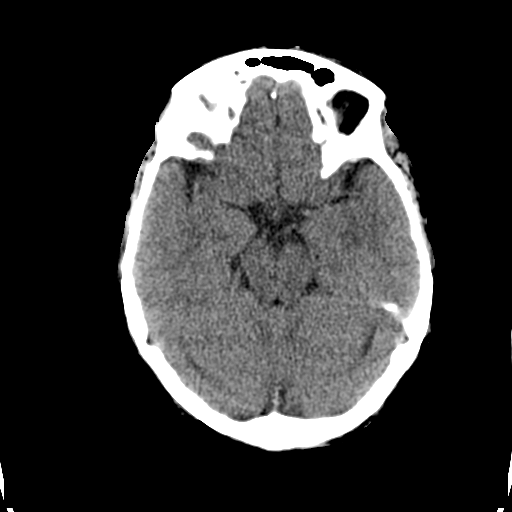
[im 9/30  bone]
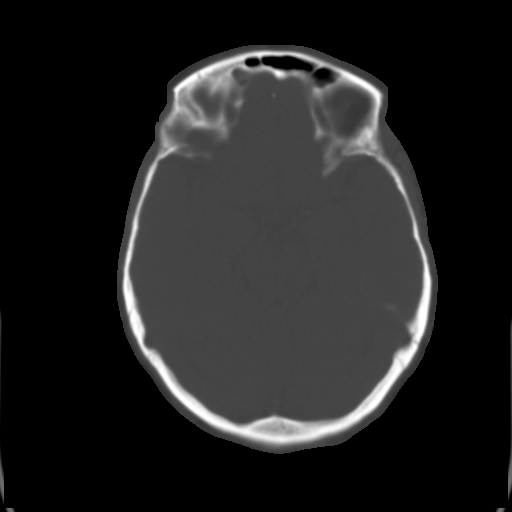
[im 11/30  brain]
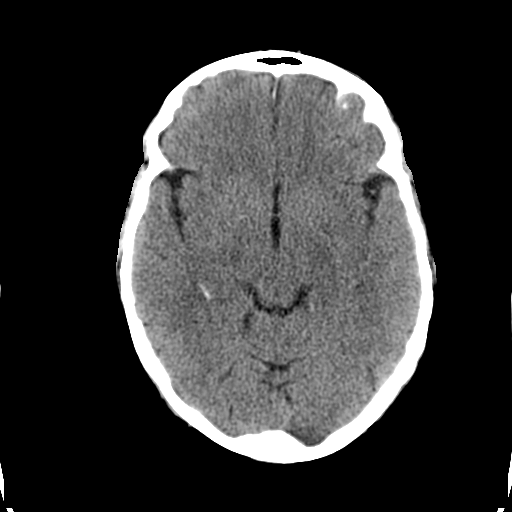
[im 13/30  brain]
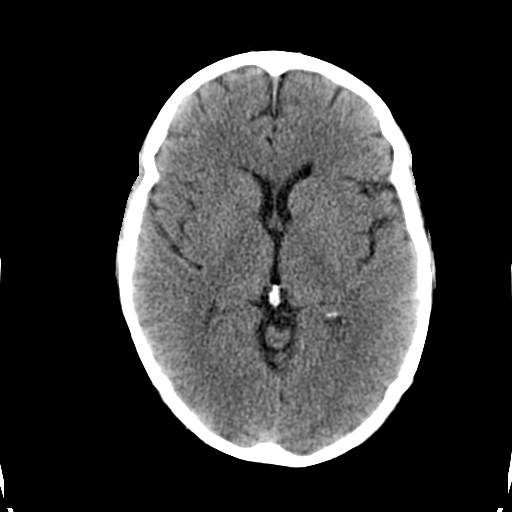
[im 15/30  brain]
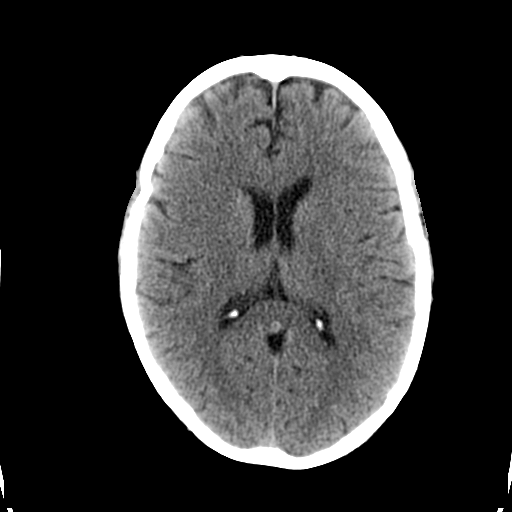
[im 16/30  brain]
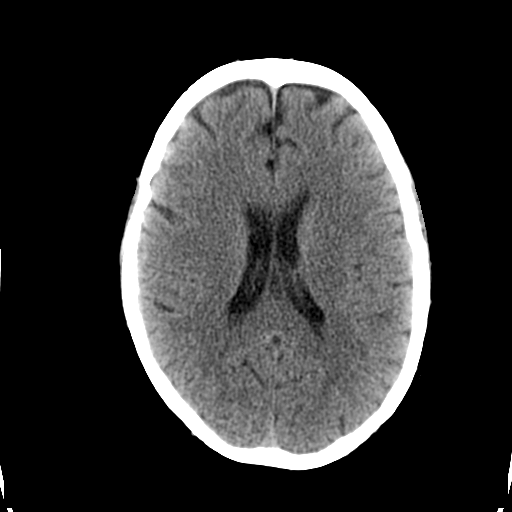
[im 16/30  bone]
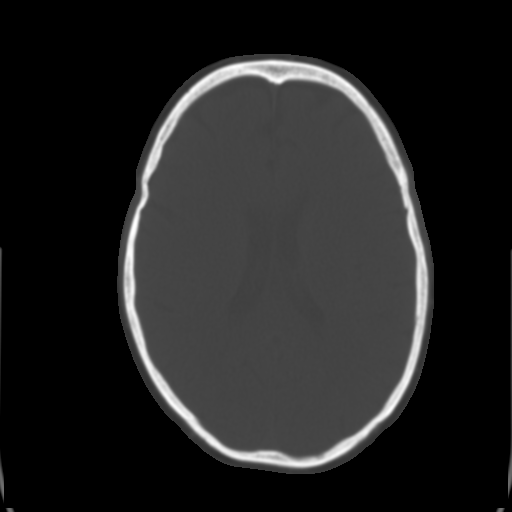
[im 18/30  brain]
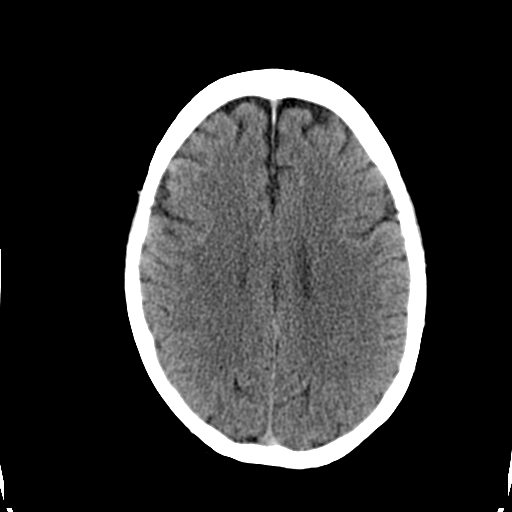
[im 20/30  brain]
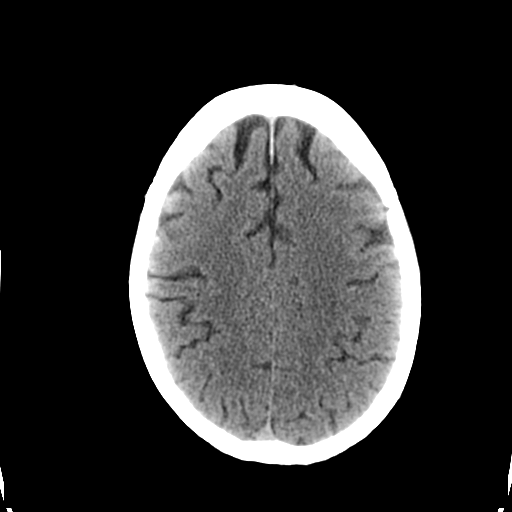
[im 22/30  brain]
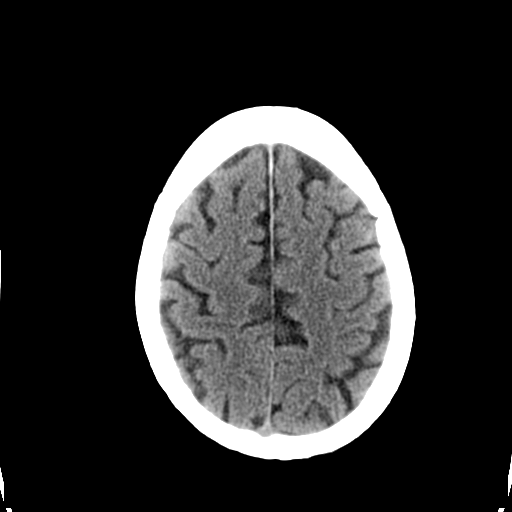
[im 23/30  brain]
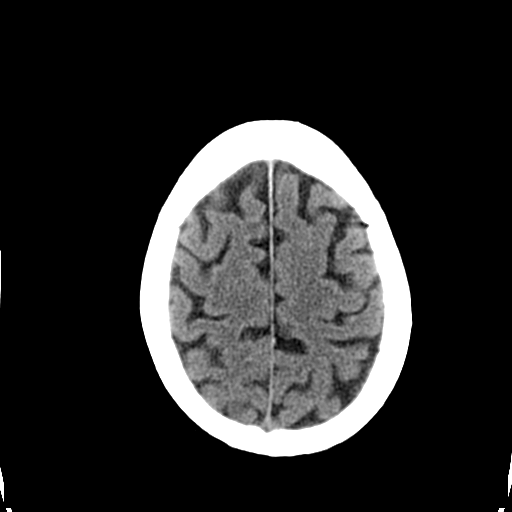
[im 23/30  bone]
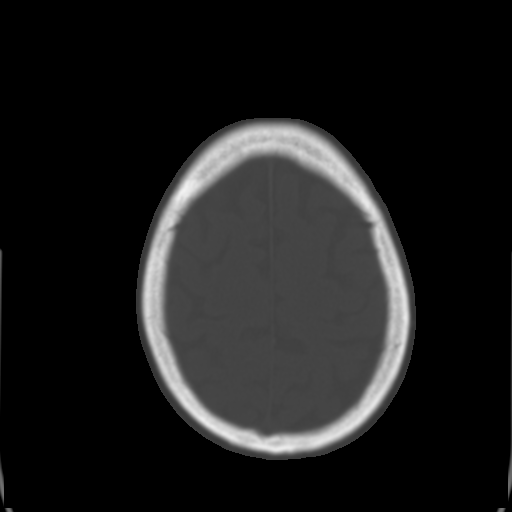
[im 25/30  brain]
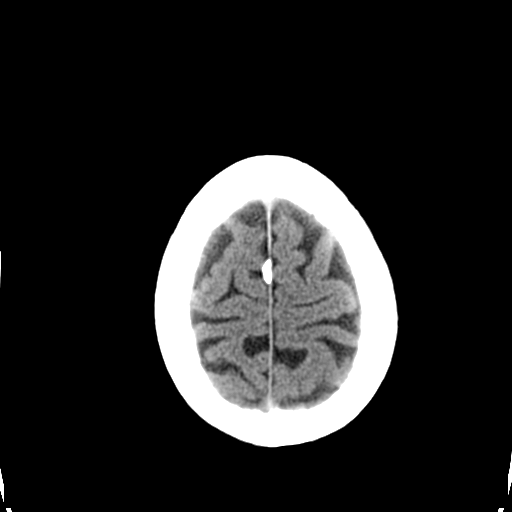
[im 27/30  brain]
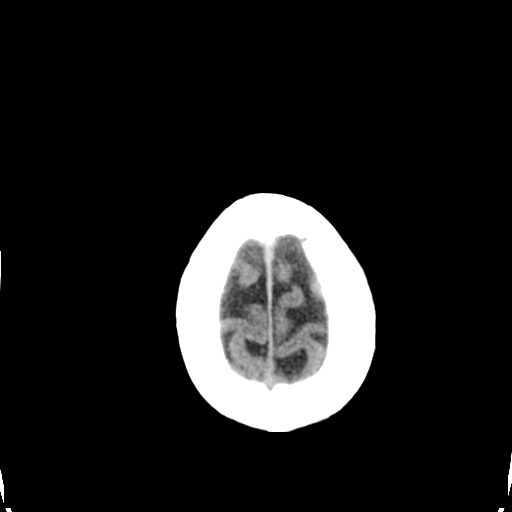
[im 29/30  brain]
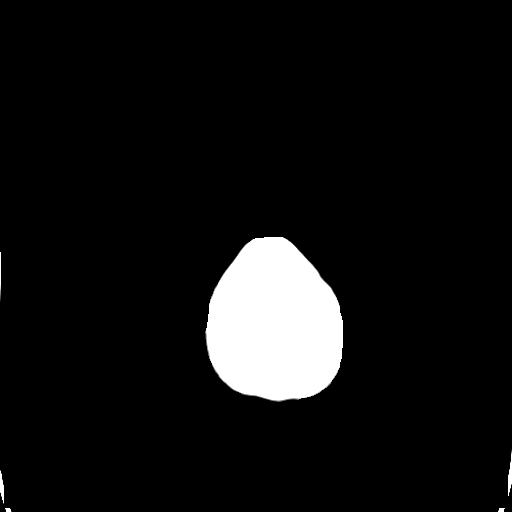

[16 of 30 positions shown; findings below may reference images not displayed]

FINDINGS: No acute hemorrhage, acute infarction, or mass lesion is
identified.  No midline shift.  No ventriculomegaly.  No skull
fracture.  Orbits and paranasal sinuses are intact.
IMPRESSION: Normal exam.

## 2012-12-09 IMAGING — MR MR MRA NECK WO/W CM
8 of 11 series · 20 of 48 positions shown · IV contrast (multihance)
Comparison: Head CT 08/02/2010.
COMPARISON: None.
COMPARISON: None.

CLINICAL DATA: 60-year-old female with dizziness, dysarthria, gait
problems.  Recent right side carotid endarterectomy per the
patient.

The patient declined postcontrast imaging of the brain following
contrast injection.
Contrast: 16 ml MultiHance.
MRI HEAD WITHOUT CONTRAST
TECHNIQUE: Multiplanar, multiecho pulse sequences of the brain and
surrounding structures were obtained according to standard protocol
without intravenous contrast.
TECHNIQUE: Angiographic images of the neck were obtained using MRA
technique without and with intravenous contrast.
TECHNIQUE: Angiographic images of the Circle of Willis were
obtained using MRA technique without  intravenous contrast.

[Series 3: T1 · sagittal · 5.0mm · 0.47mm/px · 1 of 12 slices shown]
[im 1/12]
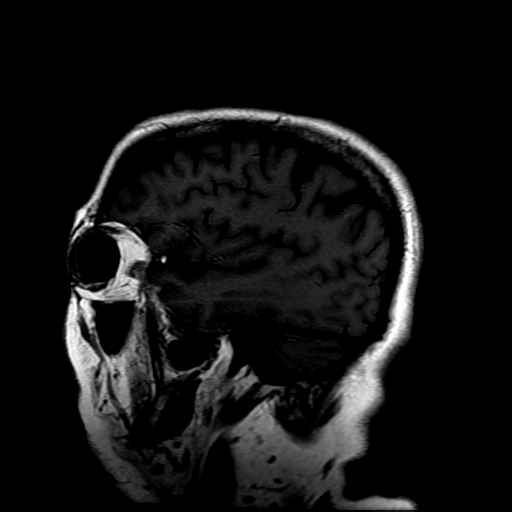

[Series 4: DWI · axial · 5.0mm · 1.09mm/px · z∈[-65,+85]mm · 3 of 58 slices shown (1 of 2)]
[im 1/58]
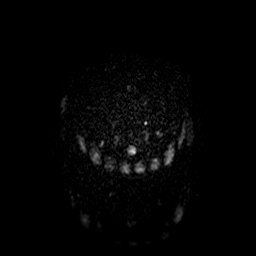
[im 29/58]
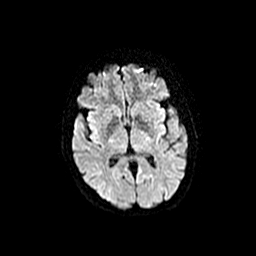
[im 58/58]
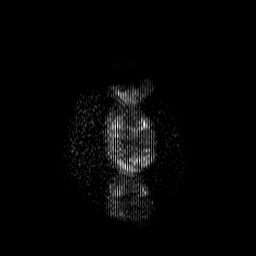

[Series 5: (id) mt fs · axial · 1.4mm · 0.43mm/px · z∈[-81,+13]mm · 8 of 136 slices shown]
[im 1/136]
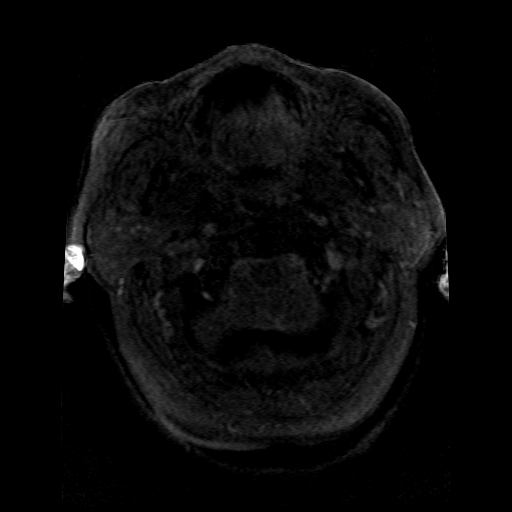
[im 20/136]
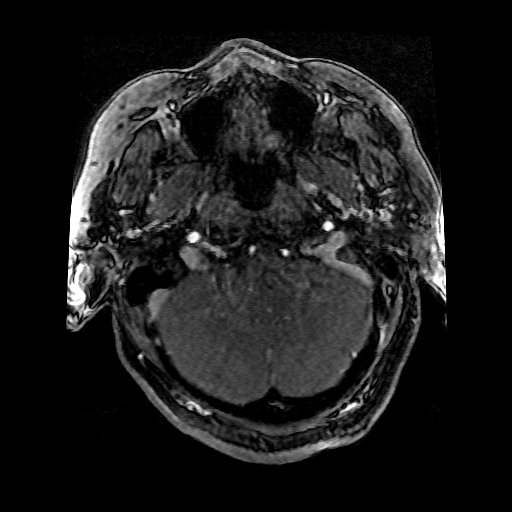
[im 39/136]
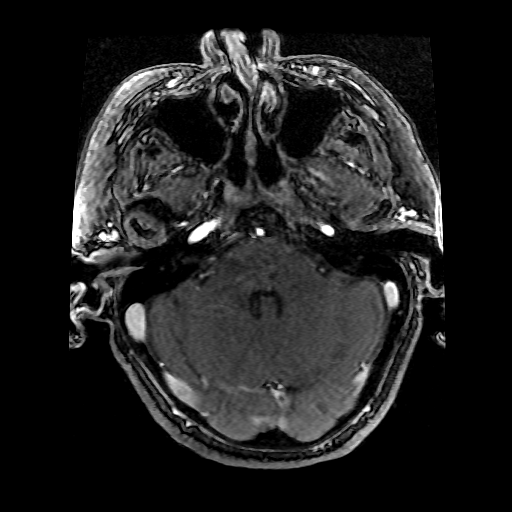
[im 58/136]
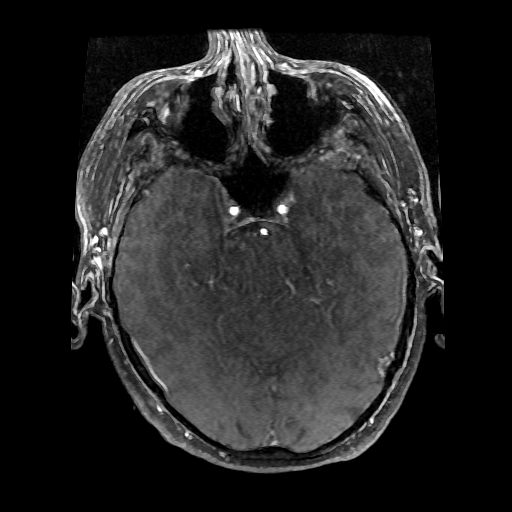
[im 78/136]
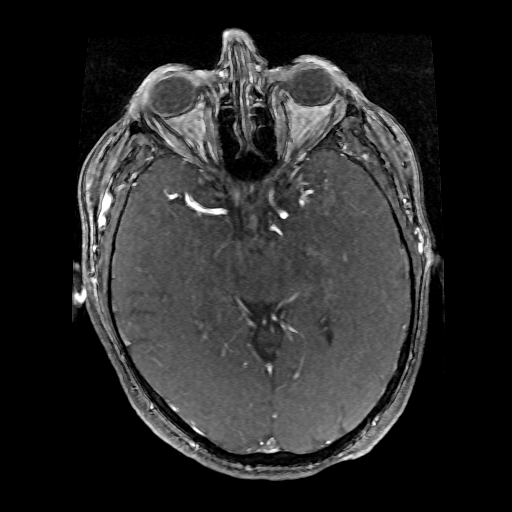
[im 97/136]
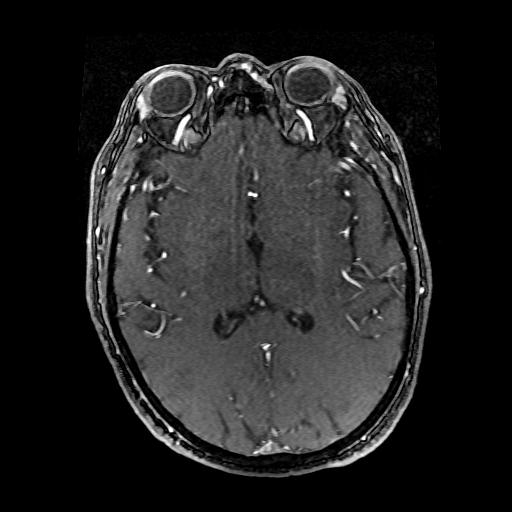
[im 116/136]
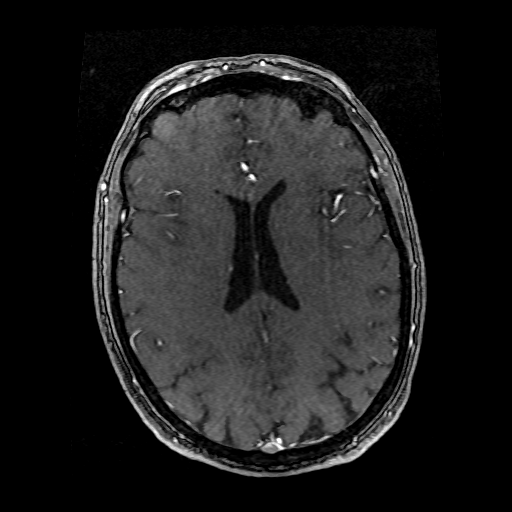
[im 136/136]
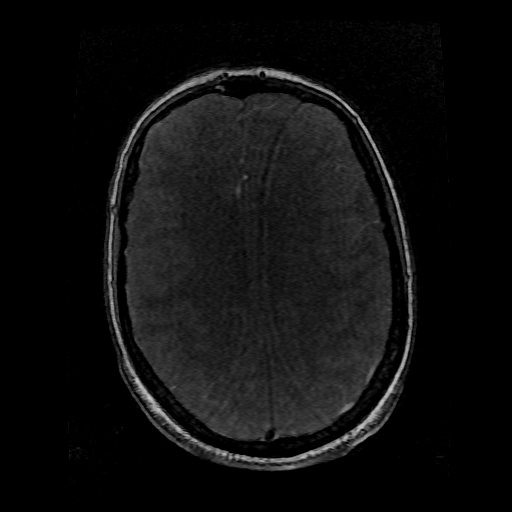

[Series 6: FLAIR · axial · 5.0mm · 0.47mm/px · 1 of 24 slices shown]
[im 1/24]
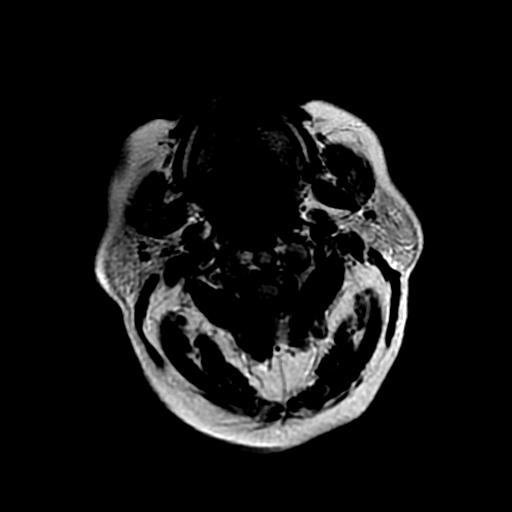

[Series 7: T2 · axial · 5.0mm · 0.43mm/px · z∈[-85,+74]mm · 2 of 24 slices shown]
[im 1/24]
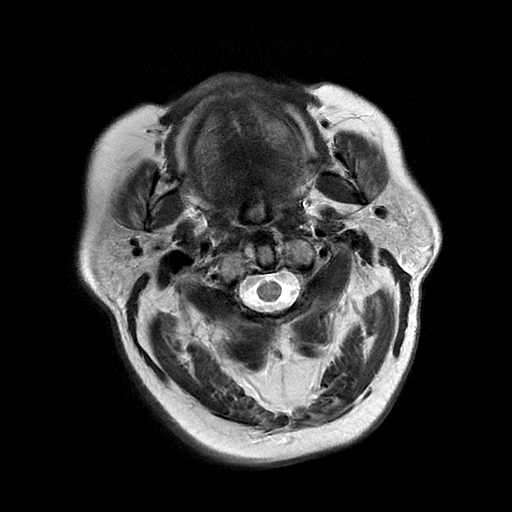
[im 24/24]
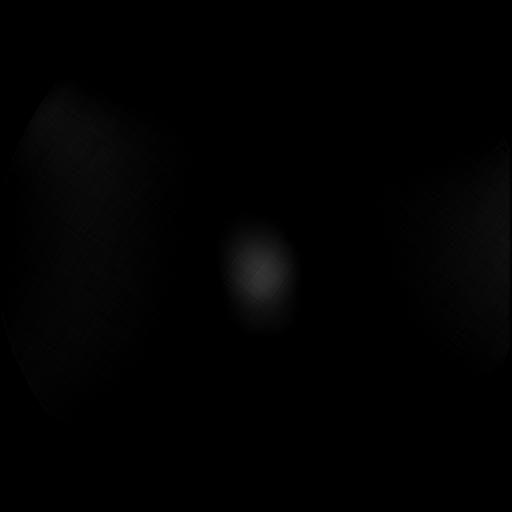

[Series 8: T2-star · axial · 5.0mm · 0.43mm/px · z∈[-85,+74]mm · 2 of 24 slices shown]
[im 1/24]
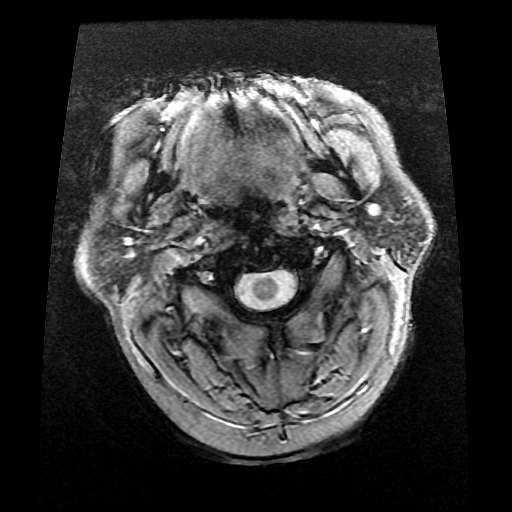
[im 24/24]
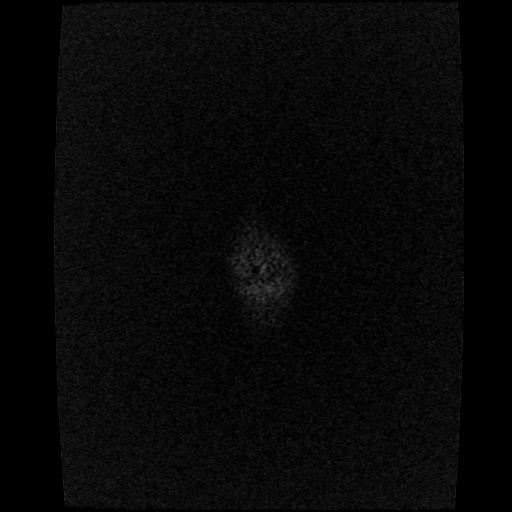

[Series 11: (id) neck · axial · 2.8mm · 0.47mm/px · 1 of 160 slices shown]
[im 1/160]
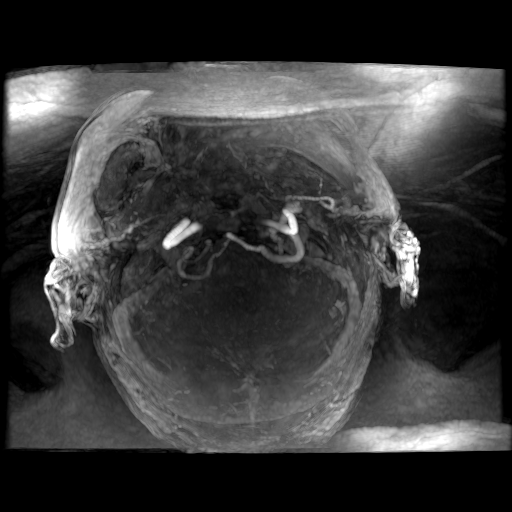

[Series 400: DWI · axial · 5.0mm · 1.09mm/px · z∈[-65,+85]mm · 2 of 29 slices shown (2 of 2)]
[im 1/29]
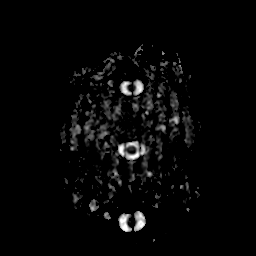
[im 29/29]
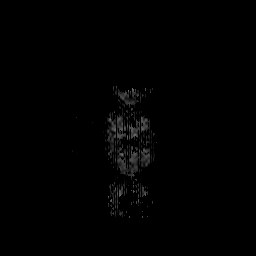

[20 of 48 positions shown; findings below may reference images not displayed]

FINDINGS: No restricted diffusion to suggest acute infarction.  No
midline shift, mass effect, evidence of mass lesion,
ventriculomegaly, extra-axial collection or acute intracranial
hemorrhage.  Cervicomedullary junction and pituitary are within
normal limits.  Major intracranial vascular flow voids are
preserved.

Mild to moderate patchy T2 and FLAIR hyperintensity in the pons.
Cerebellum is within normal limits.  Small chronic lacunar infarct
in the right caudate nucleus, otherwise the deep gray matter nuclei
are within normal limits.  Cerebral white matter signal is within
normal limits.  Negative visualized cervical spine.

Visualized bone marrow signal is within normal limits.  Visualized
paranasal sinuses and mastoids are clear.  Visualized orbits and
scalp soft tissues are within normal limits.
IMPRESSION: 1. No acute intracranial abnormality.
2.  Nonspecific moderate signal changes in the brain stem white
matter. Favor chronic small vessel disease in light of small
chronic lacunar infarct of the right caudate nucleus.
3.  MRA findings are below.

MRA NECK WITHOUT AND WITH CONTRAST
FINDINGS: Precontrast time-of-flight imaging.  Antegrade flow in
both carotids in the neck.  Mild susceptibility artifact at the
right carotid bifurcation likely is postoperative in this setting.
Antegrade flow in both vertebral arteries in the neck, the left
appears dominant.

Postcontrast images reveal a three-vessel arch configuration.
Common carotid artery origins are suboptimally visualized, no
definite hemodynamically significant stenosis.  Likewise, both
vertebral artery origins are poorly visualized.

The left vertebral artery is dominant and patent throughout the
neck without stenosis beyond its origin.  The nondominant right
vertebral artery is likewise patent.  There is a moderate degree of
stenosis just proximal to the vertebrobasilar junction on the
right, see intracranial findings below.

Postoperative changes to the right carotid bifurcation which
appears patulous.  Widely patent right ICA origin, bulb, and
cervical right ICA.

Left carotid bifurcation and left ICA origin are patent.  12 mm
beyond its origin, there is circumferential narrowing of the
cervical left ICA resulting in a appearance of high-grade stenosis.
This is not correlated on the time-of-flight images.  The anatomic
detail is such that reliable measurement with respect the distal
vessel are difficult.  Beyond this level, the cervical left ICA is
normal.
IMPRESSION: 1.  Postoperative changes to the right carotid bifurcation with no
adverse features.
2.  Left ICA stenosis 12 mm beyond its origin, with discordant
appearance on postcontrast versus time-of-flight MRA images.
I suspect this is a moderate but non hemodynamically significant
stenosis.  Evaluation with carotid Doppler ultrasound may be
valuable.
3.  Poor visualization of the common carotid and vertebral artery
origins.
4.  Dominant left vertebral artery.  Possible stenosis of the
distal most right vertebral artery, see intracranial findings
below.

MRA HEAD WITHOUT CONTRAST
FINDINGS: Intermittently degraded by motion.

Antegrade flow in the posterior circulation.  Negative appearance
of the dominant distal left vertebral artery.  Likewise, the distal
right vertebral artery appears within normal limits; it is mildly
diminutive beyond the origin of the right PICA which probably
corresponds to the neck MRA finding.

Normal bilateral PICA and AICA vessels.  Basilar artery is within
normal limits.  Superior cerebellar arteries and left PCA origin
are normal.  There is a fetal right PCA origin.  Bilateral PCA
branches are within normal limits.

Antegrade flow in both ICA siphons.  No ICA stenosis.  Bilateral
ophthalmic and right posterior communicating artery origins are
within normal limits.  Normal carotid termini, MCA and ACA origins.
Normal anterior communicating artery and visualized ACA branches.
Visualized bilateral MCA branches are within normal limits.
IMPRESSION: Negative intracranial MRA.

## 2012-12-10 ENCOUNTER — Encounter: Payer: Self-pay | Admitting: Internal Medicine

## 2012-12-10 ENCOUNTER — Ambulatory Visit (INDEPENDENT_AMBULATORY_CARE_PROVIDER_SITE_OTHER): Payer: Medicare Other | Admitting: Internal Medicine

## 2012-12-10 ENCOUNTER — Other Ambulatory Visit: Payer: Self-pay | Admitting: Internal Medicine

## 2012-12-10 VITALS — BP 121/66 | HR 85 | Temp 97.0°F | Wt 197.3 lb

## 2012-12-10 DIAGNOSIS — M545 Low back pain, unspecified: Secondary | ICD-10-CM

## 2012-12-10 DIAGNOSIS — J309 Allergic rhinitis, unspecified: Secondary | ICD-10-CM

## 2012-12-10 DIAGNOSIS — E785 Hyperlipidemia, unspecified: Secondary | ICD-10-CM

## 2012-12-10 DIAGNOSIS — I480 Paroxysmal atrial fibrillation: Secondary | ICD-10-CM

## 2012-12-10 DIAGNOSIS — R197 Diarrhea, unspecified: Secondary | ICD-10-CM

## 2012-12-10 DIAGNOSIS — F3289 Other specified depressive episodes: Secondary | ICD-10-CM

## 2012-12-10 DIAGNOSIS — G589 Mononeuropathy, unspecified: Secondary | ICD-10-CM

## 2012-12-10 DIAGNOSIS — E1149 Type 2 diabetes mellitus with other diabetic neurological complication: Secondary | ICD-10-CM

## 2012-12-10 DIAGNOSIS — F329 Major depressive disorder, single episode, unspecified: Secondary | ICD-10-CM

## 2012-12-10 DIAGNOSIS — Z5189 Encounter for other specified aftercare: Secondary | ICD-10-CM

## 2012-12-10 DIAGNOSIS — G8929 Other chronic pain: Secondary | ICD-10-CM

## 2012-12-10 DIAGNOSIS — S71002D Unspecified open wound, left hip, subsequent encounter: Secondary | ICD-10-CM

## 2012-12-10 DIAGNOSIS — M810 Age-related osteoporosis without current pathological fracture: Secondary | ICD-10-CM

## 2012-12-10 DIAGNOSIS — E1140 Type 2 diabetes mellitus with diabetic neuropathy, unspecified: Secondary | ICD-10-CM

## 2012-12-10 DIAGNOSIS — F32A Depression, unspecified: Secondary | ICD-10-CM

## 2012-12-10 DIAGNOSIS — I4891 Unspecified atrial fibrillation: Secondary | ICD-10-CM

## 2012-12-10 DIAGNOSIS — I872 Venous insufficiency (chronic) (peripheral): Secondary | ICD-10-CM

## 2012-12-10 MED ORDER — MORPHINE SULFATE ER 15 MG PO TBCR: 30.0000 mg | EXTENDED_RELEASE_TABLET | Freq: Two times a day (BID) | ORAL | Status: DC

## 2012-12-10 NOTE — Patient Instructions (Signed)
It was great to see you again.  1) You are doing great with your diabetes.  Keep up the good work.  2) Keep taking your medications as prescribed.  3) I increased your MS Contin to 30 mg by mouth twice daily for your pain.  4) I will see you back in 2 months, sooner if necessary.

## 2012-12-11 MED ORDER — FUROSEMIDE 80 MG PO TABS: ORAL_TABLET | ORAL | Status: DC

## 2012-12-11 NOTE — Assessment & Plan Note (Signed)
Tolerating the warfarin without bleeding.  Denies any palpitations or tachycardia symptoms.  Will continue lifelong anticoagulation.

## 2012-12-11 NOTE — Assessment & Plan Note (Signed)
Sugars are well controlled on the current regimen.  Most recent Hgb A1C 6.7.  Log reviewed and only 1 low with one symptomatic hypoglycemic episode in last several months.  We will continue levimer and novolog.  Diabetic foot examination done today and other diabetic preventative measures are up to date.

## 2012-12-11 NOTE — Assessment & Plan Note (Signed)
Currently w/o edema.  She has adjusted her lasix as follows when she noted the development of some edema after being instructed to lower the lasix dose:  40 mg in the morning and 80 mg in the evening.

## 2012-12-11 NOTE — Assessment & Plan Note (Signed)
Left flank lesions are slowly healing.  Will not place any more medications on them except bacitracin and keep them moist by covering with a band-aide.  If the lesions recur we will refer to Dermatology and she knows to call us if that were to happen rather than delaying evaluation.

## 2012-12-11 NOTE — Assessment & Plan Note (Signed)
Her depression is symptomatically well controlled on the Zoloft 100 mg daily and we will continue with this regimen.

## 2012-12-11 NOTE — Assessment & Plan Note (Signed)
She is tolerating the alendronate w/o complications.  We had a long discussion on the appropriate dose of the calcium therapy which she claims she is taking.

## 2012-12-11 NOTE — Assessment & Plan Note (Signed)
Symptoms markedly improved with the addition of the chlorpheniramine.  She denied any oversedation on the medication.  We will continue with the first generation antihistamine.

## 2012-12-11 NOTE — Progress Notes (Signed)
  Subjective:    Patient ID: [name], female    DOB: 1950/04/20, 62 y.o.   MRN: [id number]  HPI  Please see the A&P for the status of the pt's chronic medical problems.  Review of Systems  Constitutional: Negative for fever, chills, activity change, appetite change and unexpected weight change.  Cardiovascular: Negative for leg swelling.  Gastrointestinal: Positive for diarrhea. Negative for nausea, vomiting, abdominal pain, constipation and blood in stool.  Musculoskeletal: Positive for arthralgias, back pain, gait problem and myalgias. Negative for joint swelling.  Skin: Positive for wound.      Objective:   Physical Exam  Nursing note and vitals reviewed. Constitutional: She is oriented to person, place, and time. She appears well-developed and well-nourished. No distress.  HENT:  Head: Normocephalic and atraumatic.  Eyes: Conjunctivae are normal. Right eye exhibits no discharge. Left eye exhibits no discharge. No scleral icterus.  Musculoskeletal: Normal range of motion. She exhibits no edema and no tenderness.  Neurological: She is alert and oriented to person, place, and time. She exhibits normal muscle tone.  Skin: Skin is warm and dry. She is not diaphoretic.  Three circular lesions on left flank. Top lesion smaller than pencil eraser, dry, no drainage.  Biopsy site. Middle lesion, good granulation tissue, white exudate which may be the medication applied to lesion.  Approximate size of dime. Bottom lesion: Size of penny, slight rim of erythema, white exudate which may be medication applied to top.  Psychiatric: She has a normal mood and affect. Her behavior is normal. Judgment and thought content normal.      Assessment & Plan:   Please see problem oriented charting.

## 2012-12-11 NOTE — Assessment & Plan Note (Signed)
After discharge the diarrhea had resolved but she had 1 bout of loose stool yesterday.  She is a C. Diff. carrier apparently (3% of population).  As she does not have symptoms consistent with active C.diff., the decision was made not to treat.  We will follow clinically.

## 2012-12-11 NOTE — Assessment & Plan Note (Signed)
Last LDL 34 on crestor 20 mg PO QD.  She is due for a repeat lipid panel today but was not interested in getting it drawn. She deferred lab draw until next visit already scheduled for December.

## 2012-12-11 NOTE — Assessment & Plan Note (Signed)
No significant change in the back pain with the addition of the MS Contin at 15 mg twice daily.  We will increase the dose to 30 mg by mouth twice daily and reassess back pain at the follow-up visit.

## 2012-12-13 ENCOUNTER — Encounter (HOSPITAL_BASED_OUTPATIENT_CLINIC_OR_DEPARTMENT_OTHER): Payer: Medicare Other | Attending: General Surgery

## 2012-12-13 ENCOUNTER — Other Ambulatory Visit: Payer: Self-pay | Admitting: *Deleted

## 2012-12-13 DIAGNOSIS — E785 Hyperlipidemia, unspecified: Secondary | ICD-10-CM

## 2012-12-13 DIAGNOSIS — F419 Anxiety disorder, unspecified: Secondary | ICD-10-CM

## 2012-12-14 MED ORDER — ROSUVASTATIN CALCIUM 20 MG PO TABS: 20.0000 mg | ORAL_TABLET | Freq: Every day | ORAL | Status: DC

## 2012-12-14 MED ORDER — ALPRAZOLAM 1 MG PO TABS: ORAL_TABLET | ORAL | Status: DC

## 2012-12-14 NOTE — Telephone Encounter (Signed)
Called to pharm

## 2012-12-16 ENCOUNTER — Other Ambulatory Visit: Payer: Self-pay

## 2012-12-20 ENCOUNTER — Other Ambulatory Visit: Payer: Self-pay | Admitting: Internal Medicine

## 2012-12-20 ENCOUNTER — Ambulatory Visit (INDEPENDENT_AMBULATORY_CARE_PROVIDER_SITE_OTHER): Payer: Medicare Other | Admitting: Cardiology

## 2012-12-20 ENCOUNTER — Encounter: Payer: Self-pay | Admitting: Cardiology

## 2012-12-20 ENCOUNTER — Ambulatory Visit (INDEPENDENT_AMBULATORY_CARE_PROVIDER_SITE_OTHER): Payer: Medicare Other | Admitting: General Practice

## 2012-12-20 VITALS — BP 120/68 | HR 80 | Ht 62.0 in | Wt 194.0 lb

## 2012-12-20 DIAGNOSIS — I480 Paroxysmal atrial fibrillation: Secondary | ICD-10-CM

## 2012-12-20 DIAGNOSIS — D735 Infarction of spleen: Secondary | ICD-10-CM

## 2012-12-20 DIAGNOSIS — F17200 Nicotine dependence, unspecified, uncomplicated: Secondary | ICD-10-CM

## 2012-12-20 DIAGNOSIS — Z7901 Long term (current) use of anticoagulants: Secondary | ICD-10-CM

## 2012-12-20 DIAGNOSIS — I5032 Chronic diastolic (congestive) heart failure: Secondary | ICD-10-CM

## 2012-12-20 DIAGNOSIS — G8929 Other chronic pain: Secondary | ICD-10-CM

## 2012-12-20 DIAGNOSIS — I2789 Other specified pulmonary heart diseases: Secondary | ICD-10-CM

## 2012-12-20 DIAGNOSIS — I4891 Unspecified atrial fibrillation: Secondary | ICD-10-CM

## 2012-12-20 DIAGNOSIS — Z720 Tobacco use: Secondary | ICD-10-CM

## 2012-12-20 DIAGNOSIS — D7389 Other diseases of spleen: Secondary | ICD-10-CM

## 2012-12-20 DIAGNOSIS — I272 Pulmonary hypertension, unspecified: Secondary | ICD-10-CM

## 2012-12-20 DIAGNOSIS — M545 Low back pain, unspecified: Secondary | ICD-10-CM

## 2012-12-20 LAB — POCT INR: INR: 4.9

## 2012-12-20 MED ORDER — MORPHINE SULFATE ER 30 MG PO TBCR: 30.0000 mg | EXTENDED_RELEASE_TABLET | Freq: Two times a day (BID) | ORAL | Status: DC

## 2012-12-20 NOTE — Assessment & Plan Note (Signed)
Status post mitral valve replacement. Continue SBE prophylaxis.

## 2012-12-20 NOTE — Assessment & Plan Note (Addendum)
Continue home oxygen. Most likely related to COPD and history of mitral stenosis.

## 2012-12-20 NOTE — Assessment & Plan Note (Signed)
Patient counseled on discontinuing.

## 2012-12-20 NOTE — Progress Notes (Signed)
HPI: FU MVR. Carotid Dopplers in May 2012 showed an 80-99% right and a 60-79% left stenosis. Patient had right carotid endarterectomy and is now followed by vascular surgery. Patient had previous workup for mitral stenosis. Cardiac catheterization in August of 2012 showed normal LV function, normal coronary arteries, severe mitral stenosis, severely elevated pulmonary capillary wedge pressure and severe pulmonary hypertension. The patient had mitral valve replacement with a 27-mm pericardial porcine valve and left atrial maze procedure for paroxysmal atrial fibrillation on September 21, 2010. She developed atrial fibrillation postoperatively and had been treated with amiodarone. Patient had elective DCCV in Oct 2012. Last echocardiogram in July of 2014 showed normal LV function, prosthetic mitral valve with no significant mitral regurgitation, severe left atrial enlargement. Holter monitor in July of 2014 showed sinus rhythm with PACs and PVCs. Patient seen by primary care in July of 2014 for left upper quadrant pain. CT scan showed splenic infarct. We felt this could be related to atrial fibrillation and embolic event. Coumadin was resumed. Since she was last seen in August 2014, she does have dyspnea on exertion but no orthopnea or PND. She was hospitalized recently with diarrhea and her Lasix dose was reduced. She has noticed increased pedal edema. No chest pain or syncope.   Current Outpatient Prescriptions  Medication Sig Dispense Refill  . ACCU-CHEK FASTCLIX LANCETS MISC 1 each by Other route 3 (three) times daily before meals. Dx code 250.02 insulin requiring  102 each  12  . albuterol (PROVENTIL HFA;VENTOLIN HFA) 108 (90 BASE) MCG/ACT inhaler Inhale 2 puffs into the lungs every 6 (six) hours as needed. For wheeze or shortness of breath  3 Inhaler  3  . alendronate (FOSAMAX) 70 MG tablet Take 70 mg by mouth every 7 (seven) days. Takes on wednesdays      . ALPRAZolam (XANAX) 1 MG tablet 0.5 mg  by mouth up to twice daily as needed for anxiety. AND 66m at bedtime for sleep  60 tablet  5  . Calcium Carb-Cholecalciferol 600-800 MG-UNIT TABS Take 2 tablets by mouth 2 (two) times daily.      . chlorpheniramine (CHLOR-TRIMETON) 4 MG tablet Take 1 tablet (4 mg total) by mouth 3 (three) times daily as needed for allergies.  90 tablet  11  . docusate sodium (COLACE) 50 MG capsule Take 100 mg by mouth every morning. Can Alternate with  senokot      . fluticasone (FLONASE) 50 MCG/ACT nasal spray Place 2 sprays into the nose daily.      . Fluticasone-Salmeterol (ADVAIR DISKUS) 250-50 MCG/DOSE AEPB Inhale 1 puff into the lungs every 12 (twelve) hours.  3 each  3  . furosemide (LASIX) 80 MG tablet 40 mg (0.5 tablet) by mouth every morning and 80 mg (1 tablet) by mouth every evening  135 tablet  3  . gabapentin (NEURONTIN) 300 MG capsule Take 300 mg by mouth 2 (two) times daily.      .Marland Kitchenglucose blood (BAYER CONTOUR NEXT TEST) test strip Check blood sugar 3 times a day dx code 250.02 insulin requiring  100 each  12  . HYDROcodone-acetaminophen (NORCO/VICODIN) 5-325 MG per tablet Take 1-2 tablets by mouth every 6 (six) hours as needed for pain.  180 tablet  0  . insulin aspart (NOVOLOG FLEXPEN) 100 UNIT/ML injection Take 18 units with meals and also use SSI with meals in addition to the 18 units of insulin  15 pen  3  . Insulin Detemir (LEVEMIR FLEXPEN)  100 UNIT/ML SOPN Inject 0.36 mLs (36 Units total) into the skin 2 (two) times daily  30 mL  11  . Insulin Pen Needle (B-D UF III MINI PEN NEEDLES) 31G X 5 MM MISC DX: 250.02 Use to inject insulin 5 times daily.  200 each  11  . Insulin Syringe-Needle U-100 (INSULIN SYRINGE .3CC/31GX5/16") 31G X 5/16" 0.3 ML MISC 1 Syringe by Does not apply route 2 (two) times daily at 10 AM and 5 PM.  100 each  5  . lisinopril (PRINIVIL,ZESTRIL) 10 MG tablet Take 1 tablet (10 mg total) by mouth daily.  90 tablet  3  . morphine (MS CONTIN) 15 MG 12 hr tablet Take 2 tablets (30  mg total) by mouth every 12 (twelve) hours.  60 tablet  0  . mupirocin cream (BACTROBAN) 2 % Apply topically daily.  15 g  0  . nystatin cream (MYCOSTATIN) Apply topically 2 (two) times daily.  30 g  11  . omeprazole (PRILOSEC) 40 MG capsule Take 1 capsule (40 mg total) by mouth daily.  90 capsule  3  . petrolatum-hydrophilic-aloe vera (ALOE VESTA) ointment Apply topically 2 (two) times daily as needed for wound care.  226 g  11  . potassium chloride SA (K-DUR,KLOR-CON) 20 MEQ tablet Take 2 tablets (40 mEq total) by mouth daily.  180 tablet  3  . rosuvastatin (CRESTOR) 20 MG tablet Take 1 tablet (20 mg total) by mouth daily with supper.  90 tablet  3  . sertraline (ZOLOFT) 100 MG tablet Take 1 tablet (100 mg total) by mouth daily.  90 tablet  3  . Spacer/Aero-Holding Chambers (AEROCHAMBER MV) inhaler Use as instructed with inhalers.  1 each  0  . tiotropium (SPIRIVA HANDIHALER) 18 MCG inhalation capsule Place 1 capsule (18 mcg total) into inhaler and inhale daily.  90 capsule  3  . warfarin (COUMADIN) 5 MG tablet Take 2.5-5 mg by mouth daily. 2.85m daily except 56mon Saturdays      . ferrous gluconate (FERGON) 324 MG tablet Take 1 tablet (324 mg total) by mouth 2 (two) times daily with a meal.  120 tablet  3   No current facility-administered medications for this visit.     Past Medical History  Diagnosis Date  . COPD (chronic obstructive pulmonary disease) with emphysema     PFTs 02/2012: FEV1 0.92 (40%), ratio 69, 27% increase in FEV1 with BD, TLC 91%, severe airtrapping, DLCO49% On chronic home O2. Pulmonary rehab referral 05/2012   . Moderate to severe pulmonary hypertension     2014 TEE w PA peak pressure 46 mmHg, s/p MV replacement   . Tobacco abuse 07/28/2012  . Type 2 diabetes mellitus with diabetic neuropathy   . Hyperlipidemia LDL goal < 100 11/20/2005  . Carotid artery stenosis     s/p right endarterectomy (06/2010) Carotid [place]/2012):  Left: Moderate-to-severe (60-79%) calcific  and non-calcific plaque origin and proximal ICA and ECA   . Mitral stenosis     s/p Mitral valve replacement with a 27-mm pericardial porcine valve (Medtronic Mosaic valve, serial #[id number] 09/20/10, Dr. VaPrescott Gum  . Chronic congestive heart failure with left ventricular diastolic dysfunction 10/15/44/5035. Depression 11/19/2005  . Anxiety 07/24/2010  . Fibromyalgia 08/29/2010  . Obstructive sleep apnea     Nocturnal polysomnography (06/2009): Moderate sleep apnea/ hypopnea syndrome , AHI 17.8 per hour with nonpositional hypopneas. CPAP titration to 12 CWP, AHI 2.4 per hour. On nocturnal CPAP via a  small resMed Quattro full-face mask with heated humidifier.   . Obesity, Class II, BMI 35-39.9 10/23/2011  . Clear cell renal cell carcinoma 07/21/2011    s/p cryoablation of left RCC in 09/2011 by [name]. Followed by [name]  [facility]) .    Marland Kitchen Adenomatous polyps 05/14/2011    Colonoscopy (05/2011): 4 mm adenomatous polyp excised endoscopically Colonoscopy (02/2002): Adenomatous polyp excised endoscopically   . Gastroesophageal reflux disease   . Internal hemorrhoids 08/04/2012  . Osteoporosis     DEXA (12/09/2011): L-spine T -3.7, left hip T -1.4 DEXA (12/2004): L-spine T -2.6, left hip -0.1   . Allergic rhinitis 06/01/2012  . Bilateral cataracts 08/04/2012    Visually insignificant   . Chronic constipation 02/03/2011  . Chronic venous insufficiency 08/04/2012  . Paroxysmal atrial fibrillation 10/22/2010    s/p Left atrial maze procedure for paroxysmal atrial fibrillation on 09/20/2010 by Dr Prescott Gum.  Subsequent splenic infarct, decision was made to re-anticoagulate with coumadin, likely life-long as this is the most likely cause of the splenic infarct.   . Chronic low back pain 10/06/2012    Past Surgical History  Procedure Laterality Date  . Orif clavicle fracture  01/2004    by Thana Farr. [name], M.D for Right clavicle nonunion.  . Tonsillectomy    . Carotid endarterectomy   07/04/2010    right, by [name] for asymptomatic right carotid artery stenosis  . Lipoma exision  08/2005    occipital lipoma 1.5cm - by Dr. [name]  . Hysteroscopy with endometrial ablation  06/2001    for persistent post-menopausal bleeding // by S. [name], M.D.  . Mitral valve replacement  09/20/10     with a 27-mm pericardial porcine valve (Medtronic Mosaic valve, serial #[id number]). 09/20/10, Dr Prescott Gum  . Left atrial maze procedure  09/20/10    for paroxysmal atrial fibrillation (Dr. Prescott Gum)  . Placement of left chest tube.  09/24/2010    Dr Prescott Gum  . Cardiac valve replacement  Aug. 2012  . Colonoscopy  05/12/2011    performed by Dr. Michail Sermon. Showing small internal hemorrhoids, single tubular adenoma polyp  . Esophagogastroduodenoscopy  05/12/2011    performed by Dr. Michail Sermon. Negative for ulcerations, biopsy negative for evidence of celiac sprue  . Tubal ligation    . Eye surgery    . Fracture surgery Right     clavicle    History   Social History  . Marital Status: Divorced    Spouse Name: N/A    Number of Children: N/A  . Years of Education: N/A   Occupational History  . Not on file.   Social History Main Topics  . Smoking status: Current Every Day Smoker -- 1.50 packs/day for 50 years    Types: Cigarettes    Last Attempt to Quit: 07/12/2010  . Smokeless tobacco: Never Used  . Alcohol Use: No  . Drug Use: No  . Sexual Activity: No   Other Topics Concern  . Not on file   Social History Narrative   Lives alone in [place] ([place]) with 60 pound chow   Worked at [employer] for 18 years   No car    ROS: back and leg pain but no fevers or chills, productive cough, hemoptysis, dysphasia, odynophagia, melena, hematochezia, dysuria, hematuria, rash, seizure activity, orthopnea, PND,  claudication. Remaining systems are negative.  Physical Exam: Well-developed chronically ill appearing in no acute distress.  Skin is warm and dry.  HEENT is normal.  Neck is supple.  Chest is clear to auscultation with normal expansion.  Cardiovascular exam is regular rate and rhythm.  Abdominal exam nontender or distended. No masses palpated. Extremities show 1+ edema. neuro grossly intact  ECG sinus rhythm with first degree AV block. Cannot rule out prior septal infarct. Right axis deviation.

## 2012-12-20 NOTE — Assessment & Plan Note (Signed)
Patient remains in sinus rhythm. 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

## 2012-12-20 NOTE — Patient Instructions (Addendum)
Your physician has recommended you make the following change in your medication:   1. Increase Lasix to 120 mg am and 80 mg pm  Your physician recommends that you return for a FASTING lipid profile, BMET ,LIVER  Your physician recommends that you schedule a follow-up appointment in: 3 months

## 2012-12-20 NOTE — Assessment & Plan Note (Signed)
Continue statin. Not on aspirin given need for Coumadin. Followed by vascular surgery.

## 2012-12-20 NOTE — Assessment & Plan Note (Signed)
Continue statin. Check lipids and liver in one week.

## 2012-12-20 NOTE — Assessment & Plan Note (Signed)
>>  ASSESSMENT AND PLAN FOR CHRONIC CHF (CONGESTIVE HEART FAILURE) (HCC) WRITTEN ON 12/20/2012  8:50 AM BY CRENSHAW, Madolyn Frieze, MD  Patient is volume overloaded on examination. Change Lasix to 80 mg in the evening and 120 mg in the morning. Check potassium and renal function in one week.

## 2012-12-20 NOTE — Assessment & Plan Note (Signed)
Patient is volume overloaded on examination. Change Lasix to 80 mg in the evening and 120 mg in the morning. Check potassium and renal function in one week.

## 2012-12-28 ENCOUNTER — Telehealth: Payer: Self-pay | Admitting: Dietician

## 2012-12-28 NOTE — Telephone Encounter (Signed)
Going to the Levemir pen was the greatest thing she has ever done. Discussed other diabetes questions. Also, concerned about future cost of her care here. She plans to call our financial counselor.

## 2012-12-29 ENCOUNTER — Ambulatory Visit (INDEPENDENT_AMBULATORY_CARE_PROVIDER_SITE_OTHER): Payer: Medicare Other | Admitting: *Deleted

## 2012-12-29 DIAGNOSIS — C649 Malignant neoplasm of unspecified kidney, except renal pelvis: Secondary | ICD-10-CM

## 2012-12-29 DIAGNOSIS — D735 Infarction of spleen: Secondary | ICD-10-CM

## 2012-12-29 DIAGNOSIS — D7389 Other diseases of spleen: Secondary | ICD-10-CM

## 2012-12-29 DIAGNOSIS — Z7901 Long term (current) use of anticoagulants: Secondary | ICD-10-CM

## 2012-12-29 DIAGNOSIS — I480 Paroxysmal atrial fibrillation: Secondary | ICD-10-CM

## 2012-12-29 DIAGNOSIS — I4891 Unspecified atrial fibrillation: Secondary | ICD-10-CM

## 2012-12-29 LAB — COMPREHENSIVE METABOLIC PANEL
ALT: 17 U/L (ref 0–35)
AST: 18 U/L (ref 0–37)
Albumin: 3.7 g/dL (ref 3.5–5.2)
Alkaline Phosphatase: 40 U/L (ref 39–117)
BUN: 22 mg/dL (ref 6–23)
CO2: 33 mEq/L — ABNORMAL HIGH (ref 19–32)
Calcium: 9.3 mg/dL (ref 8.4–10.5)
Chloride: 97 mEq/L (ref 96–112)
Creatinine, Ser: 1.2 mg/dL (ref 0.4–1.2)
GFR: 49.06 mL/min — ABNORMAL LOW (ref >60.00)
Glucose, Bld: 138 mg/dL — ABNORMAL HIGH (ref 70–99)
Potassium: 4.2 mEq/L (ref 3.5–5.1)
Sodium: 136 mEq/L (ref 135–145)
Total Bilirubin: 0.6 mg/dL (ref 0.3–1.2)
Total Protein: 6.5 g/dL (ref 6.0–8.3)

## 2012-12-29 LAB — CBC WITH DIFFERENTIAL/PLATELET
Basophils Absolute: 0 10*3/uL (ref 0.0–0.1)
Basophils Relative: 0.5 % (ref 0.0–3.0)
Eosinophils Absolute: 0.1 10*3/uL (ref 0.0–0.7)
Eosinophils Relative: 1.4 % (ref 0.0–5.0)
HCT: 30.6 % — ABNORMAL LOW (ref 36.0–46.0)
Hemoglobin: 10.4 g/dL — ABNORMAL LOW (ref 12.0–15.0)
Lymphocytes Relative: 20.8 % (ref 12.0–46.0)
Lymphs Abs: 1.8 10*3/uL (ref 0.7–4.0)
MCHC: 33.8 g/dL (ref 30.0–36.0)
MCV: 91.3 fl (ref 78.0–100.0)
Monocytes Absolute: 0.8 10*3/uL (ref 0.1–1.0)
Monocytes Relative: 9.7 % (ref 3.0–12.0)
Neutro Abs: 5.7 10*3/uL (ref 1.4–7.7)
Neutrophils Relative %: 67.6 % (ref 43.0–77.0)
Platelets: 233 10*3/uL (ref 150.0–400.0)
RBC: 3.35 Mil/uL — ABNORMAL LOW (ref 3.87–5.11)
RDW: 16.6 % — ABNORMAL HIGH (ref 11.5–14.6)
WBC: 8.5 10*3/uL (ref 4.5–10.5)

## 2012-12-29 LAB — POCT INR: INR: 3

## 2012-12-31 ENCOUNTER — Other Ambulatory Visit: Payer: Self-pay | Admitting: Internal Medicine

## 2012-12-31 DIAGNOSIS — M545 Low back pain, unspecified: Secondary | ICD-10-CM

## 2012-12-31 DIAGNOSIS — G8929 Other chronic pain: Secondary | ICD-10-CM

## 2012-12-31 MED ORDER — HYDROCODONE-ACETAMINOPHEN 5-325 MG PO TABS: 1.0000 | ORAL_TABLET | Freq: Four times a day (QID) | ORAL | Status: DC | PRN

## 2013-01-01 ENCOUNTER — Encounter: Payer: Self-pay | Admitting: Internal Medicine

## 2013-01-01 DIAGNOSIS — D638 Anemia in other chronic diseases classified elsewhere: Secondary | ICD-10-CM | POA: Insufficient documentation

## 2013-01-03 NOTE — Telephone Encounter (Signed)
Rxs in pt's folder; ready to be picked up; pt was called.

## 2013-01-04 ENCOUNTER — Other Ambulatory Visit: Payer: Self-pay | Admitting: *Deleted

## 2013-01-04 MED ORDER — FLUTICASONE-SALMETEROL 250-50 MCG/DOSE IN AEPB: 1.0000 | INHALATION_SPRAY | Freq: Two times a day (BID) | RESPIRATORY_TRACT | Status: DC

## 2013-01-04 MED ORDER — ALBUTEROL SULFATE HFA 108 (90 BASE) MCG/ACT IN AERS: 2.0000 | INHALATION_SPRAY | Freq: Four times a day (QID) | RESPIRATORY_TRACT | Status: DC | PRN

## 2013-01-07 ENCOUNTER — Emergency Department (HOSPITAL_COMMUNITY): Payer: Medicare Other

## 2013-01-07 ENCOUNTER — Observation Stay (HOSPITAL_COMMUNITY)
Admission: EM | Admit: 2013-01-07 | Discharge: 2013-01-10 | Disposition: A | Discharge status: Home / Self Care | Payer: Medicare Other | Attending: Internal Medicine | Admitting: Internal Medicine

## 2013-01-07 ENCOUNTER — Encounter (HOSPITAL_COMMUNITY): Payer: Self-pay | Admitting: Emergency Medicine

## 2013-01-07 DIAGNOSIS — G4733 Obstructive sleep apnea (adult) (pediatric): Secondary | ICD-10-CM | POA: Insufficient documentation

## 2013-01-07 DIAGNOSIS — I509 Heart failure, unspecified: Secondary | ICD-10-CM | POA: Insufficient documentation

## 2013-01-07 DIAGNOSIS — IMO0001 Reserved for inherently not codable concepts without codable children: Secondary | ICD-10-CM | POA: Insufficient documentation

## 2013-01-07 DIAGNOSIS — M545 Low back pain, unspecified: Secondary | ICD-10-CM

## 2013-01-07 DIAGNOSIS — R05 Cough: Secondary | ICD-10-CM | POA: Insufficient documentation

## 2013-01-07 DIAGNOSIS — J449 Chronic obstructive pulmonary disease, unspecified: Secondary | ICD-10-CM

## 2013-01-07 DIAGNOSIS — Z8669 Personal history of other diseases of the nervous system and sense organs: Secondary | ICD-10-CM | POA: Diagnosis present

## 2013-01-07 DIAGNOSIS — M810 Age-related osteoporosis without current pathological fracture: Secondary | ICD-10-CM | POA: Insufficient documentation

## 2013-01-07 DIAGNOSIS — E1142 Type 2 diabetes mellitus with diabetic polyneuropathy: Secondary | ICD-10-CM | POA: Insufficient documentation

## 2013-01-07 DIAGNOSIS — I050 Rheumatic mitral stenosis: Secondary | ICD-10-CM | POA: Insufficient documentation

## 2013-01-07 DIAGNOSIS — M797 Fibromyalgia: Secondary | ICD-10-CM | POA: Diagnosis present

## 2013-01-07 DIAGNOSIS — E669 Obesity, unspecified: Secondary | ICD-10-CM | POA: Insufficient documentation

## 2013-01-07 DIAGNOSIS — I959 Hypotension, unspecified: Secondary | ICD-10-CM | POA: Insufficient documentation

## 2013-01-07 DIAGNOSIS — F341 Dysthymic disorder: Secondary | ICD-10-CM

## 2013-01-07 DIAGNOSIS — E118 Type 2 diabetes mellitus with unspecified complications: Secondary | ICD-10-CM | POA: Diagnosis present

## 2013-01-07 DIAGNOSIS — K219 Gastro-esophageal reflux disease without esophagitis: Secondary | ICD-10-CM | POA: Insufficient documentation

## 2013-01-07 DIAGNOSIS — R269 Unspecified abnormalities of gait and mobility: Secondary | ICD-10-CM | POA: Insufficient documentation

## 2013-01-07 DIAGNOSIS — F17200 Nicotine dependence, unspecified, uncomplicated: Secondary | ICD-10-CM | POA: Diagnosis present

## 2013-01-07 DIAGNOSIS — R42 Dizziness and giddiness: Secondary | ICD-10-CM | POA: Insufficient documentation

## 2013-01-07 DIAGNOSIS — D649 Anemia, unspecified: Secondary | ICD-10-CM | POA: Insufficient documentation

## 2013-01-07 DIAGNOSIS — I5032 Chronic diastolic (congestive) heart failure: Secondary | ICD-10-CM | POA: Diagnosis present

## 2013-01-07 DIAGNOSIS — I5033 Acute on chronic diastolic (congestive) heart failure: Secondary | ICD-10-CM | POA: Diagnosis present

## 2013-01-07 DIAGNOSIS — Z720 Tobacco use: Secondary | ICD-10-CM | POA: Diagnosis present

## 2013-01-07 DIAGNOSIS — R5383 Other fatigue: Secondary | ICD-10-CM

## 2013-01-07 DIAGNOSIS — E1169 Type 2 diabetes mellitus with other specified complication: Secondary | ICD-10-CM | POA: Diagnosis present

## 2013-01-07 DIAGNOSIS — G8929 Other chronic pain: Secondary | ICD-10-CM

## 2013-01-07 DIAGNOSIS — F411 Generalized anxiety disorder: Secondary | ICD-10-CM | POA: Insufficient documentation

## 2013-01-07 DIAGNOSIS — E1149 Type 2 diabetes mellitus with other diabetic neurological complication: Secondary | ICD-10-CM | POA: Insufficient documentation

## 2013-01-07 DIAGNOSIS — Z7901 Long term (current) use of anticoagulants: Secondary | ICD-10-CM

## 2013-01-07 DIAGNOSIS — W19XXXA Unspecified fall, initial encounter: Secondary | ICD-10-CM | POA: Insufficient documentation

## 2013-01-07 DIAGNOSIS — R296 Repeated falls: Secondary | ICD-10-CM

## 2013-01-07 DIAGNOSIS — R5381 Other malaise: Secondary | ICD-10-CM

## 2013-01-07 DIAGNOSIS — E1140 Type 2 diabetes mellitus with diabetic neuropathy, unspecified: Secondary | ICD-10-CM

## 2013-01-07 DIAGNOSIS — I4891 Unspecified atrial fibrillation: Secondary | ICD-10-CM | POA: Insufficient documentation

## 2013-01-07 DIAGNOSIS — R059 Cough, unspecified: Secondary | ICD-10-CM | POA: Insufficient documentation

## 2013-01-07 DIAGNOSIS — E119 Type 2 diabetes mellitus without complications: Secondary | ICD-10-CM

## 2013-01-07 DIAGNOSIS — E785 Hyperlipidemia, unspecified: Secondary | ICD-10-CM | POA: Insufficient documentation

## 2013-01-07 DIAGNOSIS — J4489 Other specified chronic obstructive pulmonary disease: Principal | ICD-10-CM | POA: Insufficient documentation

## 2013-01-07 DIAGNOSIS — Z9889 Other specified postprocedural states: Secondary | ICD-10-CM | POA: Insufficient documentation

## 2013-01-07 DIAGNOSIS — I480 Paroxysmal atrial fibrillation: Secondary | ICD-10-CM | POA: Diagnosis present

## 2013-01-07 HISTORY — DX: Dependence on supplemental oxygen: Z99.81

## 2013-01-07 HISTORY — DX: Unspecified osteoarthritis, unspecified site: M19.90

## 2013-01-07 HISTORY — DX: Pneumonia, unspecified organism: J18.9

## 2013-01-07 LAB — POCT I-STAT 3, ART BLOOD GAS (G3+)
Acid-Base Excess: 1 mmol/L (ref 0.0–2.0)
Bicarbonate: 25.5 mEq/L — ABNORMAL HIGH (ref 20.0–24.0)
O2 Saturation: 94 %
TCO2: 27 mmol/L (ref 0–100)
pCO2 arterial: 41 mmHg (ref 35.0–45.0)
pH, Arterial: 7.403 (ref 7.350–7.450)
pO2, Arterial: 69 mmHg — ABNORMAL LOW (ref 80.0–100.0)

## 2013-01-07 LAB — CBC WITH DIFFERENTIAL/PLATELET
Basophils Absolute: 0 10*3/uL (ref 0.0–0.1)
Basophils Relative: 0 % (ref 0–1)
Eosinophils Absolute: 0.1 10*3/uL (ref 0.0–0.7)
Eosinophils Relative: 1 % (ref 0–5)
HCT: 30.2 % — ABNORMAL LOW (ref 36.0–46.0)
Hemoglobin: 10 g/dL — ABNORMAL LOW (ref 12.0–15.0)
Lymphocytes Relative: 13 % (ref 12–46)
Lymphs Abs: 1.3 10*3/uL (ref 0.7–4.0)
MCH: 31.3 pg (ref 26.0–34.0)
MCHC: 33.1 g/dL (ref 30.0–36.0)
MCV: 94.7 fL (ref 78.0–100.0)
Monocytes Absolute: 0.9 10*3/uL (ref 0.1–1.0)
Monocytes Relative: 9 % (ref 3–12)
Neutro Abs: 7.6 10*3/uL (ref 1.7–7.7)
Neutrophils Relative %: 77 % (ref 43–77)
Platelets: 206 10*3/uL (ref 150–400)
RBC: 3.19 MIL/uL — ABNORMAL LOW (ref 3.87–5.11)
RDW: 16.3 % — ABNORMAL HIGH (ref 11.5–15.5)
WBC: 9.8 10*3/uL (ref 4.0–10.5)

## 2013-01-07 LAB — URINALYSIS, ROUTINE W REFLEX MICROSCOPIC
Bilirubin Urine: NEGATIVE
Glucose, UA: NEGATIVE mg/dL
Hgb urine dipstick: NEGATIVE
Ketones, ur: NEGATIVE mg/dL
Leukocytes, UA: NEGATIVE
Nitrite: NEGATIVE
Protein, ur: NEGATIVE mg/dL
Specific Gravity, Urine: 1.014 (ref 1.005–1.030)
Urobilinogen, UA: 0.2 mg/dL (ref 0.0–1.0)
pH: 5 (ref 5.0–8.0)

## 2013-01-07 LAB — RAPID URINE DRUG SCREEN, HOSP PERFORMED
Amphetamines: NOT DETECTED
Barbiturates: NOT DETECTED
Benzodiazepines: NOT DETECTED
Cocaine: NOT DETECTED
Opiates: POSITIVE — AB
Tetrahydrocannabinol: NOT DETECTED

## 2013-01-07 LAB — COMPREHENSIVE METABOLIC PANEL
ALT: 15 U/L (ref 0–35)
AST: 18 U/L (ref 0–37)
Albumin: 3.6 g/dL (ref 3.5–5.2)
Alkaline Phosphatase: 43 U/L (ref 39–117)
BUN: 36 mg/dL — ABNORMAL HIGH (ref 6–23)
CO2: 28 mEq/L (ref 19–32)
Calcium: 9.1 mg/dL (ref 8.4–10.5)
Chloride: 97 mEq/L (ref 96–112)
Creatinine, Ser: 1.47 mg/dL — ABNORMAL HIGH (ref 0.50–1.10)
GFR calc Af Amer: 43 mL/min — ABNORMAL LOW (ref >90)
GFR calc non Af Amer: 37 mL/min — ABNORMAL LOW (ref >90)
Glucose, Bld: 160 mg/dL — ABNORMAL HIGH (ref 70–99)
Potassium: 4.6 mEq/L (ref 3.5–5.1)
Sodium: 136 mEq/L (ref 135–145)
Total Bilirubin: 0.2 mg/dL — ABNORMAL LOW (ref 0.3–1.2)
Total Protein: 6.4 g/dL (ref 6.0–8.3)

## 2013-01-07 LAB — PROTIME-INR
INR: 1.63 — ABNORMAL HIGH (ref 0.00–1.49)
Prothrombin Time: 18.9 seconds — ABNORMAL HIGH (ref 11.6–15.2)

## 2013-01-07 LAB — GLUCOSE, CAPILLARY: Glucose-Capillary: 163 mg/dL — ABNORMAL HIGH (ref 70–99)

## 2013-01-07 LAB — MAGNESIUM: Magnesium: 2.4 mg/dL (ref 1.5–2.5)

## 2013-01-07 LAB — TROPONIN I: Troponin I: <0.3 ng/mL (ref <0.30)

## 2013-01-07 LAB — PHOSPHORUS: Phosphorus: 3.4 mg/dL (ref 2.3–4.6)

## 2013-01-07 MED ORDER — FLUTICASONE PROPIONATE 50 MCG/ACT NA SUSP
2.0000 | Freq: Every day | NASAL | Status: DC
  Administered 2013-01-07 – 2013-01-10 (×4): 2 via NASAL
  Filled 2013-01-07: qty 16

## 2013-01-07 MED ORDER — SODIUM CHLORIDE 0.9 % IJ SOLN
3.0000 mL | Freq: Two times a day (BID) | INTRAMUSCULAR | Status: DC
  Administered 2013-01-07: 3 mL via INTRAVENOUS

## 2013-01-07 MED ORDER — NICOTINE 21 MG/24HR TD PT24
21.0000 mg | MEDICATED_PATCH | Freq: Every day | TRANSDERMAL | Status: DC
  Administered 2013-01-07 – 2013-01-10 (×4): 21 mg via TRANSDERMAL
  Filled 2013-01-07 (×4): qty 1

## 2013-01-07 MED ORDER — MOMETASONE FURO-FORMOTEROL FUM 100-5 MCG/ACT IN AERO
2.0000 | INHALATION_SPRAY | Freq: Two times a day (BID) | RESPIRATORY_TRACT | Status: DC
  Administered 2013-01-07 – 2013-01-10 (×6): 2 via RESPIRATORY_TRACT
  Filled 2013-01-07: qty 8.8

## 2013-01-07 MED ORDER — SERTRALINE HCL 100 MG PO TABS
100.0000 mg | ORAL_TABLET | Freq: Every day | ORAL | Status: DC
  Administered 2013-01-07 – 2013-01-10 (×4): 100 mg via ORAL
  Filled 2013-01-07 (×4): qty 1

## 2013-01-07 MED ORDER — INSULIN ASPART 100 UNIT/ML ~~LOC~~ SOLN
0.0000 [IU] | Freq: Three times a day (TID) | SUBCUTANEOUS | Status: DC
  Administered 2013-01-08: 2 [IU] via SUBCUTANEOUS
  Administered 2013-01-08: 12:00:00 via SUBCUTANEOUS
  Administered 2013-01-08 – 2013-01-09 (×2): 2 [IU] via SUBCUTANEOUS
  Administered 2013-01-09: 3 [IU] via SUBCUTANEOUS
  Administered 2013-01-10: 2 [IU] via SUBCUTANEOUS

## 2013-01-07 MED ORDER — ATORVASTATIN CALCIUM 20 MG PO TABS
20.0000 mg | ORAL_TABLET | Freq: Every day | ORAL | Status: DC
  Administered 2013-01-07 – 2013-01-09 (×3): 20 mg via ORAL
  Filled 2013-01-07 (×4): qty 1

## 2013-01-07 MED ORDER — ALBUTEROL SULFATE HFA 108 (90 BASE) MCG/ACT IN AERS
2.0000 | INHALATION_SPRAY | Freq: Four times a day (QID) | RESPIRATORY_TRACT | Status: DC | PRN
  Filled 2013-01-07: qty 6.7

## 2013-01-07 MED ORDER — FERROUS GLUCONATE 324 (38 FE) MG PO TABS
324.0000 mg | ORAL_TABLET | Freq: Two times a day (BID) | ORAL | Status: DC
  Administered 2013-01-08 – 2013-01-10 (×5): 324 mg via ORAL
  Filled 2013-01-07 (×7): qty 1

## 2013-01-07 MED ORDER — ACETAMINOPHEN 325 MG PO TABS: 650.0000 mg | ORAL_TABLET | Freq: Four times a day (QID) | ORAL | Status: DC | PRN

## 2013-01-07 MED ORDER — ALBUTEROL SULFATE (5 MG/ML) 0.5% IN NEBU
2.5000 mg | INHALATION_SOLUTION | Freq: Once | RESPIRATORY_TRACT | Status: AC
  Administered 2013-01-07: 2.5 mg via RESPIRATORY_TRACT
  Filled 2013-01-07: qty 0.5

## 2013-01-07 MED ORDER — INSULIN DETEMIR 100 UNIT/ML FLEXPEN: 36.0000 [IU] | PEN_INJECTOR | Freq: Two times a day (BID) | SUBCUTANEOUS | Status: DC

## 2013-01-07 MED ORDER — MUPIROCIN CALCIUM 2 % EX CREA
1.0000 "application " | TOPICAL_CREAM | Freq: Every day | CUTANEOUS | Status: DC
  Administered 2013-01-07 – 2013-01-10 (×4): 1 via TOPICAL
  Filled 2013-01-07: qty 15

## 2013-01-07 MED ORDER — DOCUSATE SODIUM 100 MG PO CAPS
100.0000 mg | ORAL_CAPSULE | Freq: Every morning | ORAL | Status: DC
  Administered 2013-01-08 – 2013-01-10 (×3): 100 mg via ORAL
  Filled 2013-01-07 (×3): qty 1

## 2013-01-07 MED ORDER — WARFARIN - PHARMACIST DOSING INPATIENT: Freq: Every day | Status: DC

## 2013-01-07 MED ORDER — ALPRAZOLAM 0.5 MG PO TABS
0.5000 mg | ORAL_TABLET | Freq: Every day | ORAL | Status: DC | PRN
  Administered 2013-01-08 – 2013-01-09 (×2): 0.5 mg via ORAL
  Filled 2013-01-07 (×2): qty 1

## 2013-01-07 MED ORDER — SODIUM CHLORIDE 0.9 % IV SOLN: 250.0000 mL | INTRAVENOUS | Status: DC | PRN

## 2013-01-07 MED ORDER — SODIUM CHLORIDE 0.9 % IJ SOLN
3.0000 mL | Freq: Two times a day (BID) | INTRAMUSCULAR | Status: DC
  Administered 2013-01-08 – 2013-01-09 (×2): 3 mL via INTRAVENOUS

## 2013-01-07 MED ORDER — SODIUM CHLORIDE 0.9 % IV BOLUS (SEPSIS)
1000.0000 mL | Freq: Once | INTRAVENOUS | Status: AC
  Administered 2013-01-07: 1000 mL via INTRAVENOUS

## 2013-01-07 MED ORDER — GABAPENTIN 300 MG PO CAPS
300.0000 mg | ORAL_CAPSULE | Freq: Two times a day (BID) | ORAL | Status: DC
  Administered 2013-01-07 – 2013-01-10 (×6): 300 mg via ORAL
  Filled 2013-01-07 (×7): qty 1

## 2013-01-07 MED ORDER — IPRATROPIUM BROMIDE 0.02 % IN SOLN
0.5000 mg | Freq: Once | RESPIRATORY_TRACT | Status: AC
  Administered 2013-01-07: 0.5 mg via RESPIRATORY_TRACT
  Filled 2013-01-07: qty 2.5

## 2013-01-07 MED ORDER — SODIUM CHLORIDE 0.9 % IV SOLN
INTRAVENOUS | Status: AC
  Administered 2013-01-07: 21:00:00 via INTRAVENOUS

## 2013-01-07 MED ORDER — SODIUM CHLORIDE 0.9 % IJ SOLN: 3.0000 mL | INTRAMUSCULAR | Status: DC | PRN

## 2013-01-07 MED ORDER — WARFARIN SODIUM 5 MG PO TABS
5.0000 mg | ORAL_TABLET | Freq: Once | ORAL | Status: AC
  Administered 2013-01-07: 5 mg via ORAL
  Filled 2013-01-07: qty 1

## 2013-01-07 MED ORDER — HYDROCODONE-ACETAMINOPHEN 5-325 MG PO TABS
1.0000 | ORAL_TABLET | Freq: Four times a day (QID) | ORAL | Status: DC | PRN
  Administered 2013-01-08 – 2013-01-10 (×8): 2 via ORAL
  Filled 2013-01-07 (×8): qty 2

## 2013-01-07 MED ORDER — INSULIN DETEMIR 100 UNIT/ML ~~LOC~~ SOLN
36.0000 [IU] | Freq: Two times a day (BID) | SUBCUTANEOUS | Status: DC
  Administered 2013-01-07 – 2013-01-10 (×6): 36 [IU] via SUBCUTANEOUS
  Filled 2013-01-07 (×7): qty 0.36

## 2013-01-07 MED ORDER — TIOTROPIUM BROMIDE MONOHYDRATE 18 MCG IN CAPS
18.0000 ug | ORAL_CAPSULE | Freq: Every day | RESPIRATORY_TRACT | Status: DC
  Administered 2013-01-08 – 2013-01-10 (×3): 18 ug via RESPIRATORY_TRACT
  Filled 2013-01-07: qty 5

## 2013-01-07 NOTE — Progress Notes (Signed)
1830 transferred in from ED  Via stretcher with NT with cardiac monitor . PT no apparent distress . Able to stand and ambulate to bed with ease. Kept comfortable in bed . Safety precautions observed

## 2013-01-07 NOTE — ED Notes (Signed)
States when getting up out of the chair this morning, became dizzy, and fell-- states hit head on left side,

## 2013-01-07 NOTE — ED Notes (Signed)
Pt has expiratory wheezes- Dr Jeanell Sparrow made aware.

## 2013-01-07 NOTE — ED Provider Notes (Addendum)
CSN: [id number]     Arrival date & time 01/07/13  0827 History   First MD Initiated Contact with Patient 01/07/13 [phone]     Chief Complaint  Patient presents with  . Fall  . Dizziness   (Consider location/radiation/quality/duration/timing/severity/associated sxs/prior Treatment) HPI 62 y.o. Female with complicated, complex medical history who presents today with increasing weakness over the past 2 weeks. She states that she has just generally not felt well and feels like she has had some balance issues. She was able to walk yesterday but after being returned to her home by her sister, she felt so weak and off-balance that she was unable to get herself to bed. She does not feel that she has had any visual changes, word finding problems, or lateralized weakness. She does complain of dizziness but it is unclear from speaking with her whether she references vertigo or lightheadedness. She denies any chest pain, dyspnea, abdominal pain, vomiting, diarrhea, fever, or chills. She has not had any similar episodes in the past. Her symptoms are worsening in nature. She has had an increase in her pain medicine in the past week which has made her feel increasingly sleepy.  Past Medical History  Diagnosis Date  . COPD (chronic obstructive pulmonary disease) with emphysema     PFTs 02/2012: FEV1 0.92 (40%), ratio 69, 27% increase in FEV1 with BD, TLC 91%, severe airtrapping, DLCO49% On chronic home O2. Pulmonary rehab referral 05/2012   . Moderate to severe pulmonary hypertension     2014 TEE w PA peak pressure 46 mmHg, s/p MV replacement   . Tobacco abuse 07/28/2012  . Type 2 diabetes mellitus with diabetic neuropathy   . Hyperlipidemia LDL goal < 100 11/20/2005  . Carotid artery stenosis     s/p right endarterectomy (06/2010) Carotid US (07/2010):  Left: Moderate-to-severe (60-79%) calcific and non-calcific plaque origin and proximal ICA and ECA   . Mitral stenosis     s/p Mitral valve replacement with a  27-mm pericardial porcine valve (Medtronic Mosaic valve, serial #[id number] on 09/20/10, Dr. Prescott Gum)   . Chronic congestive heart failure with left ventricular diastolic dysfunction 4/49/7530  . Depression 11/19/2005  . Anxiety 07/24/2010  . Fibromyalgia 08/29/2010  . Obstructive sleep apnea     Nocturnal polysomnography (06/2009): Moderate sleep apnea/ hypopnea syndrome , AHI 17.8 per hour with nonpositional hypopneas. CPAP titration to 12 CWP, AHI 2.4 per hour. On nocturnal CPAP via a small resMed Quattro full-face mask with heated humidifier.   . Obesity, Class II, BMI 35-39.9 10/23/2011  . Clear cell renal cell carcinoma 07/21/2011    s/p cryoablation of left RCC in 09/2011 by [name]. Followed by [name]  [facility]) .    Marland Kitchen Adenomatous polyps 05/14/2011    Colonoscopy (05/2011): 4 mm adenomatous polyp excised endoscopically Colonoscopy (02/2002): Adenomatous polyp excised endoscopically   . Gastroesophageal reflux disease   . Internal hemorrhoids 08/04/2012  . Osteoporosis     DEXA (12/09/2011): L-spine T -3.7, left hip T -1.4 DEXA (12/2004): L-spine T -2.6, left hip -0.1   . Allergic rhinitis 06/01/2012  . Bilateral cataracts 08/04/2012    Visually insignificant   . Chronic constipation 02/03/2011  . Chronic venous insufficiency 08/04/2012  . Paroxysmal atrial fibrillation 10/22/2010    s/p Left atrial maze procedure for paroxysmal atrial fibrillation on 09/20/2010 by Dr Prescott Gum.  Subsequent splenic infarct, decision was made to re-anticoagulate with coumadin, likely life-long as this is the most likely cause of the splenic infarct.   Marland Kitchen  Chronic low back pain 10/06/2012  . Anemia of chronic disease 01/01/2013   Past Surgical History  Procedure Laterality Date  . Orif clavicle fracture  01/2004    by Thana Farr. [name], M.D for Right clavicle nonunion.  . Tonsillectomy    . Carotid endarterectomy  07/04/2010    right, by [name] for asymptomatic right carotid artery stenosis   . Lipoma exision  08/2005    occipital lipoma 1.5cm - by Dr. [name]  . Hysteroscopy with endometrial ablation  06/2001    for persistent post-menopausal bleeding // by S. [name], M.D.  . Mitral valve replacement  09/20/10     with a 27-mm pericardial porcine valve (Medtronic Mosaic valve, serial #[id number]). 09/20/10, Dr Prescott Gum  . Left atrial maze procedure  09/20/10    for paroxysmal atrial fibrillation (Dr. Prescott Gum)  . Placement of left chest tube.  09/24/2010    Dr Prescott Gum  . Cardiac valve replacement  Aug. 2012  . Colonoscopy  05/12/2011    performed by Dr. Michail Sermon. Showing small internal hemorrhoids, single tubular adenoma polyp  . Esophagogastroduodenoscopy  05/12/2011    performed by Dr. Michail Sermon. Negative for ulcerations, biopsy negative for evidence of celiac sprue  . Tubal ligation    . Eye surgery    . Fracture surgery Right     clavicle   Family History  Problem Relation Age of Onset  . Peptic Ulcer Disease Father   . Heart attack Father 67    Died of MI at age 72  . Heart attack Brother 67    Died of MI at age 76  . Obesity Brother   . Pneumonia Mother   . Healthy Sister   . Lupus Daughter   . Obsessive Compulsive Disorder Daughter    History  Substance Use Topics  . Smoking status: Current Every Day Smoker -- 1.50 packs/day for 50 years    Types: Cigarettes    Last Attempt to Quit: 07/12/2010  . Smokeless tobacco: Never Used  . Alcohol Use: No   OB History   Grav Para Term Preterm Abortions TAB SAB Ect Mult Living                 Review of Systems  All other systems reviewed and are negative.    Allergies  Lorazepam; Oxycontin; and Tramadol hcl  Home Medications   Current Outpatient Rx  Name  Route  Sig  Dispense  Refill  . ACCU-CHEK FASTCLIX LANCETS MISC   Other   1 each by Other route 3 (three) times daily before meals. Dx code 250.02 insulin requiring   102 each   12   . albuterol (PROVENTIL HFA;VENTOLIN HFA) 108 (90 BASE)  MCG/ACT inhaler   Inhalation   Inhale 2 puffs into the lungs every 6 (six) hours as needed for shortness of breath.   3 Inhaler   11   . alendronate (FOSAMAX) 70 MG tablet   Oral   Take 70 mg by mouth every 7 (seven) days. Takes on wednesdays         . ALPRAZolam (XANAX) 1 MG tablet      0.5 mg by mouth up to twice daily as needed for anxiety. AND 57m at bedtime for sleep   60 tablet   5   . Calcium Carb-Cholecalciferol 600-800 MG-UNIT TABS   Oral   Take 2 tablets by mouth 2 (two) times daily.         . chlorpheniramine (CHLOR-TRIMETON)  4 MG tablet   Oral   Take 1 tablet (4 mg total) by mouth 3 (three) times daily as needed for allergies.   90 tablet   11   . docusate sodium (COLACE) 50 MG capsule   Oral   Take 100 mg by mouth every morning. Can Alternate with  senokot         . EXPIRED: ferrous gluconate (FERGON) 324 MG tablet   Oral   Take 1 tablet (324 mg total) by mouth 2 (two) times daily with a meal.   120 tablet   3   . fluticasone (FLONASE) 50 MCG/ACT nasal spray   Nasal   Place 2 sprays into the nose daily.         . Fluticasone-Salmeterol (ADVAIR DISKUS) 250-50 MCG/DOSE AEPB   Inhalation   Inhale 1 puff into the lungs every 12 (twelve) hours.   180 each   3   . furosemide (LASIX) 80 MG tablet      1 1/2 tab am and 1 tab pm         . gabapentin (NEURONTIN) 300 MG capsule   Oral   Take 300 mg by mouth 2 (two) times daily.         Marland Kitchen glucose blood (BAYER CONTOUR NEXT TEST) test strip      Check blood sugar 3 times a day dx code 250.02 insulin requiring   100 each   12   . HYDROcodone-acetaminophen (NORCO/VICODIN) 5-325 MG per tablet   Oral   Take 1-2 tablets by mouth every 6 (six) hours as needed for severe pain.   180 tablet   0     Do not refill before 03/30/2013   . insulin aspart (NOVOLOG FLEXPEN) 100 UNIT/ML injection      Take 18 units with meals and also use SSI with meals in addition to the 18 units of insulin   15 pen    3   . Insulin Detemir (LEVEMIR FLEXPEN) 100 UNIT/ML SOPN      Inject 0.36 mLs (36 Units total) into the skin 2 (two) times daily   30 mL   11   . Insulin Pen Needle (B-D UF III MINI PEN NEEDLES) 31G X 5 MM MISC      DX: 250.02 Use to inject insulin 5 times daily.   200 each   11   . Insulin Syringe-Needle U-100 (INSULIN SYRINGE .3CC/31GX5/16") 31G X 5/16" 0.3 ML MISC   Does not apply   1 Syringe by Does not apply route 2 (two) times daily at 10 AM and 5 PM.   100 each   5   . lisinopril (PRINIVIL,ZESTRIL) 10 MG tablet   Oral   Take 1 tablet (10 mg total) by mouth daily.   90 tablet   3   . morphine (MS CONTIN) 30 MG 12 hr tablet   Oral   Take 1 tablet (30 mg total) by mouth every 12 (twelve) hours.   60 tablet   0   . mupirocin cream (BACTROBAN) 2 %   Topical   Apply topically daily.   15 g   0   . nystatin cream (MYCOSTATIN)   Topical   Apply topically 2 (two) times daily.   30 g   11   . omeprazole (PRILOSEC) 40 MG capsule   Oral   Take 1 capsule (40 mg total) by mouth daily.   90 capsule   3   . petrolatum-hydrophilic-aloe vera (ALOE VESTA)  ointment   Topical   Apply topically 2 (two) times daily as needed for wound care.   226 g   11   . potassium chloride SA (K-DUR,KLOR-CON) 20 MEQ tablet   Oral   Take 2 tablets (40 mEq total) by mouth daily.   180 tablet   3   . rosuvastatin (CRESTOR) 20 MG tablet   Oral   Take 1 tablet (20 mg total) by mouth daily with supper.   90 tablet   3   . sertraline (ZOLOFT) 100 MG tablet   Oral   Take 1 tablet (100 mg total) by mouth daily.   90 tablet   3   . Spacer/Aero-Holding Chambers (AEROCHAMBER MV) inhaler      Use as instructed with inhalers.   1 each   0   . tiotropium (SPIRIVA HANDIHALER) 18 MCG inhalation capsule   Inhalation   Place 1 capsule (18 mcg total) into inhaler and inhale daily.   90 capsule   3   . warfarin (COUMADIN) 5 MG tablet   Oral   Take 2.5-5 mg by mouth daily. 2.4m  daily except 574mon Saturdays          BP 93/46  Pulse 93  Temp(Src) 98.8 F (37.1 C) (Rectal)  Resp 14  SpO2 98% Physical Exam  Nursing note and vitals reviewed. Constitutional: She is oriented to person, place, and time. She appears well-developed and well-nourished.  Obese  HENT:  Head: Normocephalic and atraumatic.  Right Ear: Tympanic membrane and external ear normal.  Left Ear: Tympanic membrane and external ear normal.  Nose: Nose normal. Right sinus exhibits no maxillary sinus tenderness and no frontal sinus tenderness. Left sinus exhibits no maxillary sinus tenderness and no frontal sinus tenderness.  Eyes: Conjunctivae and EOM are normal. Pupils are equal, round, and reactive to light. Right eye exhibits no nystagmus. Left eye exhibits no nystagmus.  Neck: Normal range of motion. Neck supple.  Cardiovascular: Normal rate, regular rhythm, normal heart sounds and intact distal pulses.   Pulmonary/Chest: Effort normal and breath sounds normal. No respiratory distress. She exhibits no tenderness.  Abdominal: Soft. Bowel sounds are normal. She exhibits no distension and no mass. There is no tenderness.  Musculoskeletal: Normal range of motion. She exhibits edema. She exhibits no tenderness.  Neurological: She is alert and oriented to person, place, and time. She has normal strength and normal reflexes. No sensory deficit. She displays a negative Romberg sign. GCS eye subscore is 4. GCS verbal subscore is 5. GCS motor subscore is 6.  Reflex Scores:      Tricep reflexes are 2+ on the right side and 2+ on the left side.      Bicep reflexes are 2+ on the right side and 2+ on the left side.      Brachioradialis reflexes are 2+ on the right side and 2+ on the left side.      Patellar reflexes are 2+ on the right side and 2+ on the left side.      Achilles reflexes are 2+ on the right side and 2+ on the left side. Patient appears generally weak with difficulty sitting up in bed but no  lateralized deficit is noted.   Finger toe and heel shin appear normal Speech is normal without dysarthria, dysphasia, or aphasia. Muscle strength is 5/5 in bilateral shoulders, elbow flexor and extensors, wrist flexor and extensors, and intrinsic hand muscles. 5/5 bilateral lower extremity hip flexors, extensors, knee flexors and  extensors, and ankle dorsi and plantar flexors.    Skin: Skin is warm and dry. No rash noted.  Psychiatric: She has a normal mood and affect. Her behavior is normal. Judgment and thought content normal.    ED Course  Procedures (including critical care time) Labs Review Labs Reviewed  GLUCOSE, CAPILLARY - Abnormal; Notable for the following:    Glucose-Capillary 163 (*)    All other components within normal limits  CBC WITH DIFFERENTIAL  COMPREHENSIVE METABOLIC PANEL  TROPONIN I  PROTIME-INR  URINALYSIS, ROUTINE W REFLEX MICROSCOPIC   Imaging Review Ct Head Wo Contrast  01/07/2013   CLINICAL DATA:  Dizziness.  Coumadin therapy.  EXAM: CT HEAD WITHOUT CONTRAST  TECHNIQUE: Contiguous axial images were obtained from the base of the skull through the vertex without intravenous contrast.  COMPARISON:  12/12/2011.  FINDINGS: No mass. No hydrocephalus. No hemorrhage. No acute bony abnormality. Paranasal sinuses mastoids are clear.  IMPRESSION: No acute abnormality.   Electronically Signed   By: [place]   On: 01/07/2013 10:02   Dg Chest Port 1 View  01/07/2013   CLINICAL DATA:  Fall this morning.  Weakness and history of COPD.  EXAM: PORTABLE CHEST - 1 VIEW  COMPARISON:  Multiple priors  FINDINGS: The cardiomediastinal silhouette is likely unchanged given patient rotation. Prominence of the interstitial markings, with a chronic component. Superimposed pulmonary vascular congestion is not excluded. Mild bibasilar opacities, left greater than right, likely atelectasis. No pleural effusion or pneumothorax. No acute osseous abnormality.  IMPRESSION: 1. Prominence  of the interstitial markings. A component of this is chronic. Superimposed pulmonary vascular congestion is not excluded.  2. Bibasilar opacities, left greater than right, likely atelectasis.   Electronically Signed   By: Donavan Burnet M.D.   On: 01/07/2013 09:29    EKG Interpretation   None      Results for orders placed during the hospital encounter of 01/07/13  GLUCOSE, CAPILLARY      Result Value Range   Glucose-Capillary 163 (*) 70 - 99 mg/dL  CBC WITH DIFFERENTIAL      Result Value Range   WBC 9.8  4.0 - 10.5 K/uL   RBC 3.19 (*) 3.87 - 5.11 MIL/uL   Hemoglobin 10.0 (*) 12.0 - 15.0 g/dL   HCT 30.2 (*) 36.0 - 46.0 %   MCV 94.7  78.0 - 100.0 fL   MCH 31.3  26.0 - 34.0 pg   MCHC 33.1  30.0 - 36.0 g/dL   RDW 16.3 (*) 11.5 - 15.5 %   Platelets 206  150 - 400 K/uL   Neutrophils Relative % 77  43 - 77 %   Neutro Abs 7.6  1.7 - 7.7 K/uL   Lymphocytes Relative 13  12 - 46 %   Lymphs Abs 1.3  0.7 - 4.0 K/uL   Monocytes Relative 9  3 - 12 %   Monocytes Absolute 0.9  0.1 - 1.0 K/uL   Eosinophils Relative 1  0 - 5 %   Eosinophils Absolute 0.1  0.0 - 0.7 K/uL   Basophils Relative 0  0 - 1 %   Basophils Absolute 0.0  0.0 - 0.1 K/uL  COMPREHENSIVE METABOLIC PANEL      Result Value Range   Sodium 136  135 - 145 mEq/L   Potassium 4.6  3.5 - 5.1 mEq/L   Chloride 97  96 - 112 mEq/L   CO2 28  19 - 32 mEq/L   Glucose, Bld 160 (*) 70 -  99 mg/dL   BUN 36 (*) 6 - 23 mg/dL   Creatinine, Ser 1.47 (*) 0.50 - 1.10 mg/dL   Calcium 9.1  8.4 - 10.5 mg/dL   Total Protein 6.4  6.0 - 8.3 g/dL   Albumin 3.6  3.5 - 5.2 g/dL   AST 18  0 - 37 U/L   ALT 15  0 - 35 U/L   Alkaline Phosphatase 43  39 - 117 U/L   Total Bilirubin 0.2 (*) 0.3 - 1.2 mg/dL   GFR calc non Af Amer 37 (*) >90 mL/min   GFR calc Af Amer 43 (*) >90 mL/min  TROPONIN I      Result Value Range   Troponin I <0.30  <0.30 ng/mL  PROTIME-INR      Result Value Range   Prothrombin Time 18.9 (*) 11.6 - 15.2 seconds   INR 1.63  (*) 0.00 - 1.49  URINALYSIS, ROUTINE W REFLEX MICROSCOPIC      Result Value Range   Color, Urine YELLOW  YELLOW   APPearance CLEAR  CLEAR   Specific Gravity, Urine 1.014  1.005 - 1.030   pH 5.0  5.0 - 8.0   Glucose, UA NEGATIVE  NEGATIVE mg/dL   Hgb urine dipstick NEGATIVE  NEGATIVE   Bilirubin Urine NEGATIVE  NEGATIVE   Ketones, ur NEGATIVE  NEGATIVE mg/dL   Protein, ur NEGATIVE  NEGATIVE mg/dL   Urobilinogen, UA 0.2  0.0 - 1.0 mg/dL   Nitrite NEGATIVE  NEGATIVE   Leukocytes, UA NEGATIVE  NEGATIVE    Date: 01/07/2013  Rate: 91  Rhythm: normal sinus rhythm  QRS Axis: right  Intervals: PR prolonged  ST/T Wave abnormalities: nonspecific ST changes  Conduction Disutrbances:none  Narrative Interpretation:   Old EKG Reviewed: none available    MDM  No diagnosis found. Patient weak here and hypotensive on exam.  She has had a liter of ns without bp response.  She does not give history c.w. Volume depletion but has had her narcotics increased for low back pain and gives some history of increased lethargy from this that may contribute to some volume depletion. Her creatinine has increased from first prior of 1.2 to 1.43.initl fluid bolus and already has peripheral edema and increased markings on cxr c.w. Chf.  She does not have fever, leukocytosi, or cough c.w. Pneumonia.  Plan admission for further evaluation of hypotension and lethargy- suspect decreasing narcotics will improve symptoms but cannot rule out secondary cause such as heart failure or infection.   She is followed by Dr. Donald Pore in [place].  Discussed with Dr. Marigene Ehlers on call for tsb and she will see and evaluate.   Shaune Pollack, MD 01/07/13 [place], MD 01/07/13 [phone]

## 2013-01-07 NOTE — ED Notes (Signed)
Pt on the phone.  No distress

## 2013-01-07 NOTE — Progress Notes (Signed)
ANTICOAGULATION CONSULT NOTE - Initial Consult  Pharmacy Consult for Coumadin Indication: atrial fibrillation, hx probable splenic infarct  Allergies  Allergen Reactions  . Lorazepam     Patient's sister noted that ativan caused the patient to become extremely confused during hospitalization 09/2010  . Oxycontin [Oxycodone] Other (See Comments)    headache  . Tramadol Hcl     REACTION: swelling    Patient Measurements:   Heparin Dosing Weight: n/a  Vital Signs: Temp: 98.8 F (37.1 C) (11/28 1005) Temp src: Rectal (11/28 1005) BP: 116/87 mmHg (11/28 1355) Pulse Rate: 96 (11/28 1355)  Labs:  Recent Labs  01/07/13 0939 01/07/13 0943  HGB 10.0*  --   HCT 30.2*  --   PLT 206  --   LABPROT 18.9*  --   INR 1.63*  --   CREATININE 1.47*  --   TROPONINI  --  <0.30    The CrCl is unknown because both a height and weight (above a minimum accepted value) are required for this calculation.   Medical History: Past Medical History  Diagnosis Date  . COPD (chronic obstructive pulmonary disease) with emphysema     PFTs 02/2012: FEV1 0.92 (40%), ratio 69, 27% increase in FEV1 with BD, TLC 91%, severe airtrapping, DLCO49% On chronic home O2. Pulmonary rehab referral 05/2012   . Moderate to severe pulmonary hypertension     2014 TEE w PA peak pressure 46 mmHg, s/p MV replacement   . Tobacco abuse 07/28/2012  . Type 2 diabetes mellitus with diabetic neuropathy   . Hyperlipidemia LDL goal < 100 11/20/2005  . Carotid artery stenosis     s/p right endarterectomy (06/2010) Carotid US (07/2010):  Left: Moderate-to-severe (60-79%) calcific and non-calcific plaque origin and proximal ICA and ECA   . Mitral stenosis     s/p Mitral valve replacement with a 27-mm pericardial porcine valve (Medtronic Mosaic valve, serial #[id number] on 09/20/10, Dr. Prescott Gum)   . Chronic congestive heart failure with left ventricular diastolic dysfunction 0/35/4656  . Depression 11/19/2005  . Anxiety 07/24/2010   . Fibromyalgia 08/29/2010  . Obstructive sleep apnea     Nocturnal polysomnography (06/2009): Moderate sleep apnea/ hypopnea syndrome , AHI 17.8 per hour with nonpositional hypopneas. CPAP titration to 12 CWP, AHI 2.4 per hour. On nocturnal CPAP via a small resMed Quattro full-face mask with heated humidifier.   . Obesity, Class II, BMI 35-39.9 10/23/2011  . Clear cell renal cell carcinoma 07/21/2011    s/p cryoablation of left RCC in 09/2011 by [name]. Followed by [name]  [facility]) .    Marland Kitchen Adenomatous polyps 05/14/2011    Colonoscopy (05/2011): 4 mm adenomatous polyp excised endoscopically Colonoscopy (02/2002): Adenomatous polyp excised endoscopically   . Gastroesophageal reflux disease   . Internal hemorrhoids 08/04/2012  . Osteoporosis     DEXA (12/09/2011): L-spine T -3.7, left hip T -1.4 DEXA (12/2004): L-spine T -2.6, left hip -0.1   . Allergic rhinitis 06/01/2012  . Bilateral cataracts 08/04/2012    Visually insignificant   . Chronic constipation 02/03/2011  . Chronic venous insufficiency 08/04/2012  . Paroxysmal atrial fibrillation 10/22/2010    s/p Left atrial maze procedure for paroxysmal atrial fibrillation on 09/20/2010 by Dr Prescott Gum.  Subsequent splenic infarct, decision was made to re-anticoagulate with coumadin, likely life-long as this is the most likely cause of the splenic infarct.   . Chronic low back pain 10/06/2012  . Anemia of chronic disease 01/01/2013    Assessment: 62 yo  female admitted with increasing weakness and dizziness.  On chronic Coumadin for afib.  Today's INR 1.63.  Pharmacy asked to continue Coumadin while she is inpatient.  No history of bleeding noted.  Of note, her INR = 4.9 at the Coumadin clinic on 11/10, and dose was reduced from 2.5 mg daily with 1 day per week of 5 mg to 2.5 mg daily.  F/u INR at the clinic = 3 and same dosage (2.5 mg daily) was continued.  Goal of Therapy:  INR 2-3 Monitor platelets by anticoagulation protocol:  Yes   Plan:  1. Coumadin 5 mg po x 1 tonight. 2. Check daily PT/INR.  Uvaldo Rising, BCPS  Clinical Pharmacist Pager [phone]  01/07/2013 3:02 PM

## 2013-01-07 NOTE — ED Notes (Signed)
The pt has been sleeping.  She wakes up and c/o the heat  She has not finished her sandwich.  She reports that she fell asleep

## 2013-01-07 NOTE — ED Notes (Addendum)
Per EMS: pt from home with recent increase in MS contin and having dizziness since then x 3 days; pt sts fell today; pt c/o lower back pain and nausea with dizziness; pt on blood thinners; IV 20g L AC and given 28m albuterol and 462mZofran

## 2013-01-07 NOTE — H&P (Signed)
Date: 01/07/2013               Patient Name:  [name] MRN: [id number]  DOB: 1950/09/22 Age / Sex: 62 y.o., female   PCP: Karren Cobble, MD         Medical Service: Internal Medicine Teaching Service         Attending Physician: Dr. Murlean Caller    First Contact: Dr. Ronnald Ramp Pager: [phone]  Second Contact: Dr. Aundra Dubin Pager: [phone]       After Hours (After 5p/  First Contact Pager: [phone]  weekends / holidays): Second Contact Pager: [phone]   Chief Complaint: Fall  History of Present Illness:  [name] is a 62 y.o. female w/ PMHx of severe COPD (Gold's III), pulmonary HTN, paroxysmal A-fib (s/p maze procedure), CHF, mitral stenosis, CA stenosis, OSA, DM type II, HLD, h/o RCCa, GERD, anxiety, and depression, presents to the ED w/ complaints of a recent fall overnight. The patient claims she got up at night to use the bathroom, took one step, stumbled and fell. Prior to falling, patient felt quite "dizzy" and fell to the ground, hitting her head on the ground. Given her use of Coumadin, the patient was worried that she could bleed and came to the ED. The patient notes that she recently had an increase in her dose of MS Contin from 15 mg bid to 30 mg bid. She also claims that her BP has been low lately 90's-100's/40's-50's. The patient denies any recent nausea or vomiting, diarrhea, increased urinary frequency or dysuria. She also denies decreased po intake lately. Also claims to have a headache. Patient otherwise denies chest pain, SOB (uses 1L O2 continuously at home), abdominal pain and LE swelling. No recent fever or chills. No seizures or LOC.  Meds: No current facility-administered medications for this encounter.   Current Outpatient Prescriptions  Medication Sig Dispense Refill  . albuterol (PROVENTIL HFA;VENTOLIN HFA) 108 (90 BASE) MCG/ACT inhaler Inhale 2 puffs into the lungs every 6 (six) hours as needed for shortness of breath.      Marland Kitchen alendronate (FOSAMAX) 70  MG tablet Take 70 mg by mouth every 7 (seven) days. Takes on wednesdays      . ALPRAZolam (XANAX) 1 MG tablet Take 0.5 mg by mouth daily as needed. 0.5 mg by mouth up to twice daily as needed for anxiety. AND 29m at bedtime for sleep      . Calcium Carb-Cholecalciferol 600-800 MG-UNIT TABS Take 2 tablets by mouth 2 (two) times daily.      . chlorpheniramine (CHLOR-TRIMETON) 4 MG tablet Take 1 tablet (4 mg total) by mouth 3 (three) times daily as needed for allergies.  90 tablet  11  . docusate sodium (COLACE) 50 MG capsule Take 100 mg by mouth every morning. Can Alternate with  senokot      . fluticasone (FLONASE) 50 MCG/ACT nasal spray Place 2 sprays into the nose daily.      . Fluticasone-Salmeterol (ADVAIR) 250-50 MCG/DOSE AEPB Inhale 1 puff into the lungs every 12 (twelve) hours.      . furosemide (LASIX) 80 MG tablet Take 80-120 mg by mouth 2 (two) times daily. 1 1/2 tab am and 1 tab pm      . gabapentin (NEURONTIN) 300 MG capsule Take 300 mg by mouth 2 (two) times daily.      .Marland KitchenHYDROcodone-acetaminophen (NORCO/VICODIN) 5-325 MG per tablet Take 1-2 tablets by mouth every 6 (six) hours as needed for moderate  pain or severe pain.      Marland Kitchen insulin aspart (NOVOLOG) 100 UNIT/ML injection Inject 18 Units into the skin 3 (three) times daily with meals. Take 18 units with meals and also use SSI with meals in addition to the 18 units of insulin      . Insulin Detemir 100 UNIT/ML SOPN Inject 36 Units into the skin 2 (two) times daily. Inject 0.36 mLs (36 Units total) into the skin 2 (two) times daily      . lisinopril (PRINIVIL,ZESTRIL) 10 MG tablet Take 1 tablet (10 mg total) by mouth daily.  90 tablet  3  . morphine (MS CONTIN) 30 MG 12 hr tablet Take 30 mg by mouth every 12 (twelve) hours.      . mupirocin cream (BACTROBAN) 2 % Apply 1 application topically daily.      Marland Kitchen nystatin cream (MYCOSTATIN) Apply 1 application topically 2 (two) times daily.      Marland Kitchen omeprazole (PRILOSEC) 40 MG capsule Take 1  capsule (40 mg total) by mouth daily.  90 capsule  3  . petrolatum-hydrophilic-aloe vera (ALOE VESTA) ointment Apply 1 application topically 2 (two) times daily as needed for wound care.      . potassium chloride SA (K-DUR,KLOR-CON) 20 MEQ tablet Take 20 mEq by mouth 2 (two) times daily.      . rosuvastatin (CRESTOR) 20 MG tablet Take 20 mg by mouth daily with supper.      . sertraline (ZOLOFT) 100 MG tablet Take 1 tablet (100 mg total) by mouth daily.  90 tablet  3  . tiotropium (SPIRIVA) 18 MCG inhalation capsule Place 18 mcg into inhaler and inhale daily.      Marland Kitchen warfarin (COUMADIN) 5 MG tablet Take 2.5 mg by mouth daily.       . ferrous gluconate (FERGON) 324 MG tablet Take 1 tablet (324 mg total) by mouth 2 (two) times daily with a meal.  120 tablet  3    Allergies: Allergies as of 01/07/2013 - Review Complete 01/07/2013  Allergen Reaction Noted  . Lorazepam  11/04/2010  . Oxycontin [oxycodone] Other (See Comments) 05/03/2012  . Tramadol hcl     Past Medical History  Diagnosis Date  . COPD (chronic obstructive pulmonary disease) with emphysema     PFTs 02/2012: FEV1 0.92 (40%), ratio 69, 27% increase in FEV1 with BD, TLC 91%, severe airtrapping, DLCO49% On chronic home O2. Pulmonary rehab referral 05/2012   . Moderate to severe pulmonary hypertension     2014 TEE w PA peak pressure 46 mmHg, s/p MV replacement   . Tobacco abuse 07/28/2012  . Type 2 diabetes mellitus with diabetic neuropathy   . Hyperlipidemia LDL goal < 100 11/20/2005  . Carotid artery stenosis     s/p right endarterectomy (06/2010) Carotid US (07/2010):  Left: Moderate-to-severe (60-79%) calcific and non-calcific plaque origin and proximal ICA and ECA   . Mitral stenosis     s/p Mitral valve replacement with a 27-mm pericardial porcine valve (Medtronic Mosaic valve, serial #[id number] on 09/20/10, Dr. Prescott Gum)   . Chronic congestive heart failure with left ventricular diastolic dysfunction 08/09/4763  . Depression  11/19/2005  . Anxiety 07/24/2010  . Fibromyalgia 08/29/2010  . Obstructive sleep apnea     Nocturnal polysomnography (06/2009): Moderate sleep apnea/ hypopnea syndrome , AHI 17.8 per hour with nonpositional hypopneas. CPAP titration to 12 CWP, AHI 2.4 per hour. On nocturnal CPAP via a small resMed Quattro full-face mask with heated humidifier.   Marland Kitchen  Obesity, Class II, BMI 35-39.9 10/23/2011  . Clear cell renal cell carcinoma 07/21/2011    s/p cryoablation of left RCC in 09/2011 by [name]. Followed by [name]  [facility]) .    Marland Kitchen Adenomatous polyps 05/14/2011    Colonoscopy (05/2011): 4 mm adenomatous polyp excised endoscopically Colonoscopy (02/2002): Adenomatous polyp excised endoscopically   . Gastroesophageal reflux disease   . Internal hemorrhoids 08/04/2012  . Osteoporosis     DEXA (12/09/2011): L-spine T -3.7, left hip T -1.4 DEXA (12/2004): L-spine T -2.6, left hip -0.1   . Allergic rhinitis 06/01/2012  . Bilateral cataracts 08/04/2012    Visually insignificant   . Chronic constipation 02/03/2011  . Chronic venous insufficiency 08/04/2012  . Paroxysmal atrial fibrillation 10/22/2010    s/p Left atrial maze procedure for paroxysmal atrial fibrillation on 09/20/2010 by Dr Prescott Gum.  Subsequent splenic infarct, decision was made to re-anticoagulate with coumadin, likely life-long as this is the most likely cause of the splenic infarct.   . Chronic low back pain 10/06/2012  . Anemia of chronic disease 01/01/2013   Past Surgical History  Procedure Laterality Date  . Orif clavicle fracture  01/2004    by Thana Farr. [name], M.D for Right clavicle nonunion.  . Tonsillectomy    . Carotid endarterectomy  07/04/2010    right, by [name] for asymptomatic right carotid artery stenosis  . Lipoma exision  08/2005    occipital lipoma 1.5cm - by Dr. [name]  . Hysteroscopy with endometrial ablation  06/2001    for persistent post-menopausal bleeding // by S. [name], M.D.  . Mitral  valve replacement  09/20/10     with a 27-mm pericardial porcine valve (Medtronic Mosaic valve, serial #[id number]). 09/20/10, Dr Prescott Gum  . Left atrial maze procedure  09/20/10    for paroxysmal atrial fibrillation (Dr. Prescott Gum)  . Placement of left chest tube.  09/24/2010    Dr Prescott Gum  . Cardiac valve replacement  Aug. 2012  . Colonoscopy  05/12/2011    performed by Dr. Michail Sermon. Showing small internal hemorrhoids, single tubular adenoma polyp  . Esophagogastroduodenoscopy  05/12/2011    performed by Dr. Michail Sermon. Negative for ulcerations, biopsy negative for evidence of celiac sprue  . Tubal ligation    . Eye surgery    . Fracture surgery Right     clavicle   Family History  Problem Relation Age of Onset  . Peptic Ulcer Disease Father   . Heart attack Father 36    Died of MI at age 32  . Heart attack Brother 83    Died of MI at age 66  . Obesity Brother   . Pneumonia Mother   . Healthy Sister   . Lupus Daughter   . Obsessive Compulsive Disorder Daughter    History   Social History  . Marital Status: Divorced    Spouse Name: N/A    Number of Children: N/A  . Years of Education: N/A   Occupational History  . Not on file.   Social History Main Topics  . Smoking status: Current Every Day Smoker -- 1.50 packs/day for 50 years    Types: Cigarettes    Last Attempt to Quit: 07/12/2010  . Smokeless tobacco: Never Used  . Alcohol Use: No  . Drug Use: No  . Sexual Activity: No   Other Topics Concern  . Not on file   Social History Narrative   Lives [place] in [place] [facility]) with  60 pound chow   Worked at [employer] for 18 years   No car    Review of Systems: General: Positive for fatigue. Denies fever, chills, diaphoresis, appetite change. Respiratory: Denies SOB, DOE, cough, chest tightness, and wheezing.   Cardiovascular: Denies chest pain, palpitations and leg swelling.  Gastrointestinal: Denies nausea, vomiting, abdominal pain, diarrhea,  constipation, blood in stool and abdominal distention.  Genitourinary: Denies dysuria, urgency, frequency, hematuria, flank pain and difficulty urinating.  Endocrine: Denies hot or cold intolerance, sweats, polyuria, polydipsia. Musculoskeletal: Denies myalgias, back pain, joint swelling, arthralgias and gait problem.  Skin: Denies pallor, rash and wounds.  Neurological: Positive for dizziness, weakness, headache, and recent fall. Denies seizures, syncope, lightheadedness, numbness. Psychiatric/Behavioral: Denies mood changes, confusion, nervousness, sleep disturbance and agitation.  Physical Exam: Filed Vitals:   01/07/13 1316 01/07/13 1355 01/07/13 1501 01/07/13 1526  BP:  116/87  116/43  Pulse: 95 96 91 89  Temp:   100.5 F (38.1 C) 99.2 F (37.3 C)  TempSrc:   Oral   Resp: _0 SpO2: 95% 95% 91% 96%  General: Vital signs reviewed.  Patient is a well-developed and well-nourished, in no acute distress and cooperative with exam. Alert and oriented x3. Frequent yawning during exam.  Head: Normocephalic and atraumatic. Eyes: PERRL, EOMI, conjunctivae normal, No scleral icterus.  Neck: Supple, trachea midline, normal ROM, No JVD, masses, thyromegaly, or carotid bruit present.  Cardiovascular: RRR, S1 normal, S2 normal, no murmurs, gallops, or rubs. Pulmonary/Chest: Normal respiratory effort, CTAB, no wheezes, rales, or rhonchi. Abdominal: Soft, non-tender, non-distended, bowel sounds are normal, no masses, organomegaly, or guarding present.  Musculoskeletal: No joint deformities, erythema, or stiffness, ROM full and no nontender. Extremities: +1 pitting edema.  Pulses symmetric and intact bilaterally. No cyanosis or clubbing. Neurological: A&O x3, Strength is normal and symmetric bilaterally, cranial nerve II-XII are grossly intact, no focal motor deficit, sensory intact to light touch bilaterally.  Skin: Warm, dry and intact. No rashes or erythema. Psychiatric: Normal mood and  affect. speech and behavior is normal. Cognition and memory are normal.   Lab results: Basic Metabolic Panel:  Recent Labs  01/07/13 0939  NA 136  K 4.6  CL 97  CO2 28  GLUCOSE 160*  BUN 36*  CREATININE 1.47*  CALCIUM 9.1   Liver Function Tests:  Recent Labs  01/07/13 0939  AST 18  ALT 15  ALKPHOS 43  BILITOT 0.2*  PROT 6.4  ALBUMIN 3.6   CBC:  Recent Labs  01/07/13 0939  WBC 9.8  NEUTROABS 7.6  HGB 10.0*  HCT 30.2*  MCV 94.7  PLT 206   Cardiac Enzymes:  Recent Labs  01/07/13 0943  TROPONINI <0.30   CBG:  Recent Labs  01/07/13 0900  GLUCAP 163*   Coagulation:  Recent Labs  01/07/13 0939  LABPROT 18.9*  INR 1.63*    Alcohol Level: No results found for this basename: ETH,  in the last 72 hours Urinalysis:  Recent Labs  01/07/13 [place]  LABSPEC 1.014  PHURINE 5.0  GLUCOSEU NEGATIVE  HGBUR NEGATIVE  BILIRUBINUR NEGATIVE  KETONESUR NEGATIVE  PROTEINUR NEGATIVE  UROBILINOGEN 0.2  NITRITE NEGATIVE  LEUKOCYTESUR NEGATIVE   Imaging results:  Ct Head Wo Contrast  01/07/2013   CLINICAL DATA:  Dizziness.  Coumadin therapy.  EXAM: CT HEAD WITHOUT CONTRAST  TECHNIQUE: Contiguous axial images were obtained from the base of the skull through the vertex without intravenous contrast.  COMPARISON:  12/12/2011.  FINDINGS: No mass. No hydrocephalus. No hemorrhage. No acute bony abnormality. Paranasal sinuses mastoids are clear.  IMPRESSION: No acute abnormality.   Electronically Signed   By: [place]   On: 01/07/2013 10:02   Dg Chest Port 1 View  01/07/2013   CLINICAL DATA:  Fall this morning.  Weakness and history of COPD.  EXAM: PORTABLE CHEST - 1 VIEW  COMPARISON:  Multiple priors  FINDINGS: The cardiomediastinal silhouette is likely unchanged given patient rotation. Prominence of the interstitial markings, with a chronic component. Superimposed pulmonary vascular congestion is not excluded. Mild bibasilar opacities, left  greater than right, likely atelectasis. No pleural effusion or pneumothorax. No acute osseous abnormality.  IMPRESSION: 1. Prominence of the interstitial markings. A component of this is chronic. Superimposed pulmonary vascular congestion is not excluded.  2. Bibasilar opacities, left greater than right, likely atelectasis.   Electronically Signed   By: Donavan Burnet M.D.   On: 01/07/2013 09:29    Other results: EKG: NSR @ 91 bpm. Low voltage. 1st degree AV block.  Assessment & Plan by Problem: [name] is a 62 y.o. female w/ PMHx of severe COPD (Gold's III), pulmonary HTN, paroxysmal A-fib (s/p maze procedure), CHF, mitral stenosis, CA stenosis, OSA, DM type II, HLD, h/o RCCa, GERD, anxiety, and depression, presents to the ED w/ recent fall and complaints of associated weakness and dizziness, likely 2/2 pain medication intoxication.  Fall/weakness/instability- Patient w/ recent fall after standing from sleeping. Complains of dizziness/lightheadedness prior to falling. Recent issues w/ hypotension and increase in MS Contin dose from 15 mg bid to 30 mg bid. Likely explanation for fall. Also reports h/o head trauma on fall. CT head  In ED shows no acute abnormalities. Patient does not report palpitations, given history of episode, unlikely to be cardiogenic. No loss of consciousness noted. No history of seizures,  -Admitted to telemetry -Given 1L bolus NS in ED. -Perform orthostatics to determine if orthostatic HoTN.  -Will start NS @ 75 ml/hr for 12 hours. Cr. 1.42, elevated from baseline of 1.2.  COPD- PFTs performed in 02/2012: FEV1 0.92 (40%), ratio 69, 27% increase in FEV1 with BD, TLC 91%, w/ severe airtrapping, DLCO 49% On chronic home O2. Will continue home regimen. -Given albuterol neb in ED -Spiriva, albuterol inhaler prn, Dulera -Continue O2 via [place] @ 2L -Nicoderm patch  DM type II- On Insulin Detemir 36 units bid + Novolog 18 units tid. Also on Lisinopril 10 mg qd. Most  recent HbA1c 6.7. -Continue Detemir 36 units bid -ISS  CHF- Most recent ECHO in 08/2012, EF of 55-60%, w/ increased in a pattern of mild LVH and severely LA dilation. No signs of CHF exacerbation at this time. -Cautious w/ IVF's.  HLD- Continue Lipitor 20 mg qhs  Anxiety/depression- Continue home meds  Dispo: Disposition is deferred at this time, awaiting improvement of current medical problems. Anticipated discharge in approximately 1-2 day(s).   The patient does have a current PCP Karren Cobble, MD) and does need an [facility] hospital follow-up appointment after discharge.  The patient does not have transportation limitations that hinder transportation to clinic appointments.  Signed: Corky Sox, MD 01/07/2013, 3:29 PM  Pager: [phone]

## 2013-01-07 NOTE — ED Notes (Signed)
CBG 163

## 2013-01-07 NOTE — ED Notes (Signed)
States "I don't really know what is going on-- I have spent the last few days in another world--I didn't make it to bed last night because my back hurt." Pt has multiple c/o-- low back pain-- worse over last 2 days. Denies urinary symptoms. Leaning to the right in bed, no weakness noted

## 2013-01-07 NOTE — ED Notes (Signed)
D/c due to error

## 2013-01-07 NOTE — ED Notes (Signed)
Pt has 2-3+ pitting edema from toes to knees bilaterally.

## 2013-01-07 NOTE — ED Notes (Signed)
Admitting doctor at the bedside

## 2013-01-07 NOTE — ED Notes (Signed)
Sandwich given

## 2013-01-07 NOTE — Progress Notes (Signed)
Patient has refused the hospital CPAP machine and states that her daughter should be bringing her own machine for her to use instead. Patient is aware to call RT if for some reason she cant get her machine tonight and she does need to use ours. Patient is also aware that biomed will inspect her machine once it arrives. RT will continue to assist as needed.

## 2013-01-07 NOTE — ED Notes (Signed)
Pt c/o cramping in legs

## 2013-01-07 NOTE — ED Notes (Signed)
Report called to 4e juanita

## 2013-01-08 LAB — GLUCOSE, CAPILLARY
Glucose-Capillary: 160 mg/dL — ABNORMAL HIGH (ref 70–99)
Glucose-Capillary: 141 mg/dL — ABNORMAL HIGH (ref 70–99)
Glucose-Capillary: 153 mg/dL — ABNORMAL HIGH (ref 70–99)
Glucose-Capillary: 131 mg/dL — ABNORMAL HIGH (ref 70–99)

## 2013-01-08 LAB — BASIC METABOLIC PANEL
BUN: 25 mg/dL — ABNORMAL HIGH (ref 6–23)
CO2: 28 mEq/L (ref 19–32)
Calcium: 8.7 mg/dL (ref 8.4–10.5)
Chloride: 103 mEq/L (ref 96–112)
Creatinine, Ser: 1.05 mg/dL (ref 0.50–1.10)
GFR calc Af Amer: 64 mL/min — ABNORMAL LOW (ref >90)
GFR calc non Af Amer: 55 mL/min — ABNORMAL LOW (ref >90)
Glucose, Bld: 140 mg/dL — ABNORMAL HIGH (ref 70–99)
Potassium: 4.5 mEq/L (ref 3.5–5.1)
Sodium: 140 mEq/L (ref 135–145)

## 2013-01-08 LAB — PROTIME-INR
INR: 1.69 — ABNORMAL HIGH (ref 0.00–1.49)
Prothrombin Time: 19.4 seconds — ABNORMAL HIGH (ref 11.6–15.2)

## 2013-01-08 IMAGING — US US ABDOMEN COMPLETE
1 series · 14 of 25 positions shown · non-contrast
Comparison: 08/29/2009

CLINICAL DATA: Abdominal pain.

COMPLETE ABDOMINAL ULTRASOUND

[Series 1: us abdomen complete · 0.43mm/px · 14 of 76 slices shown]
[im 1/76]
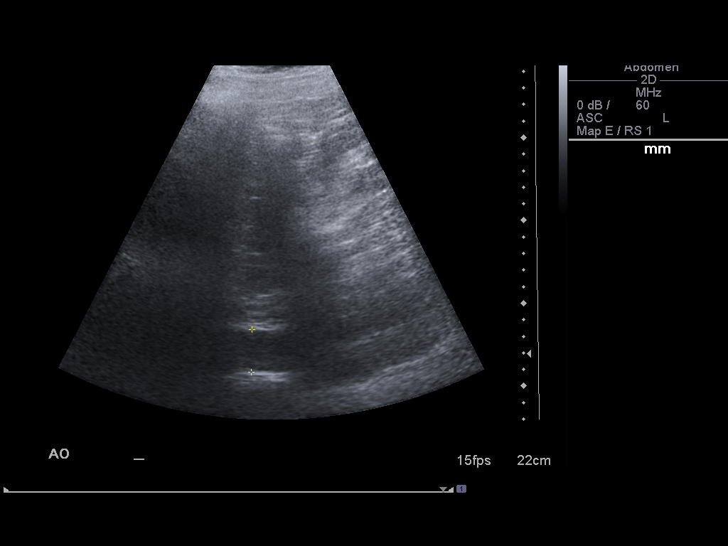
[im 7/76]
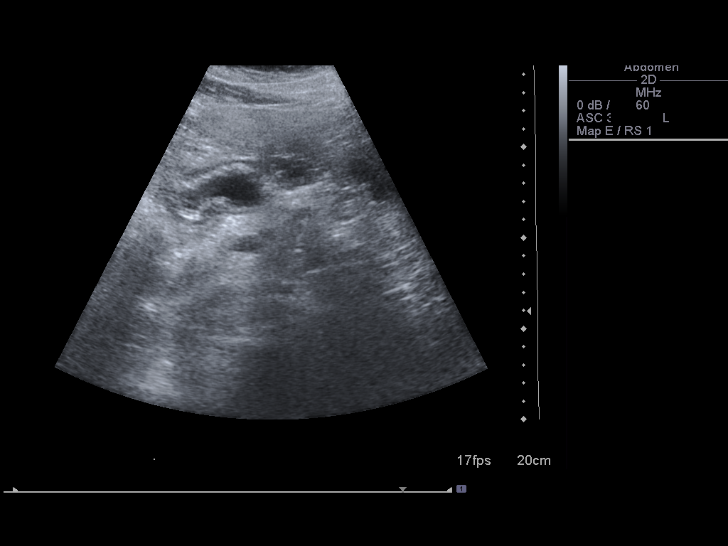
[im 13/76]
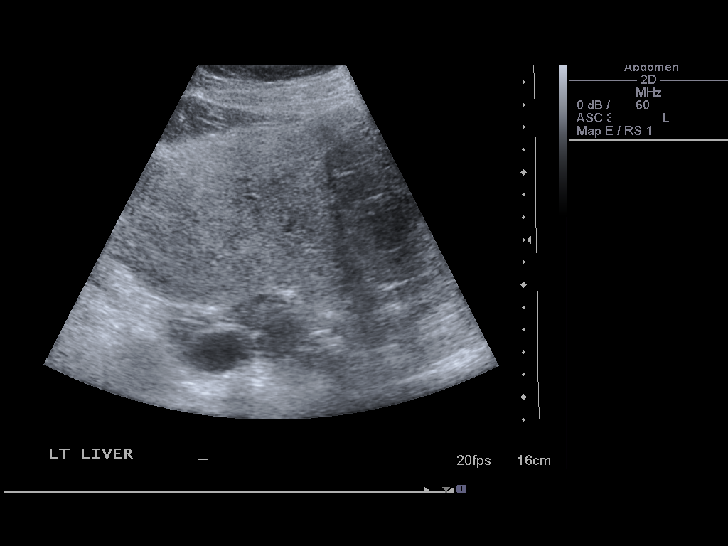
[im 19/76]
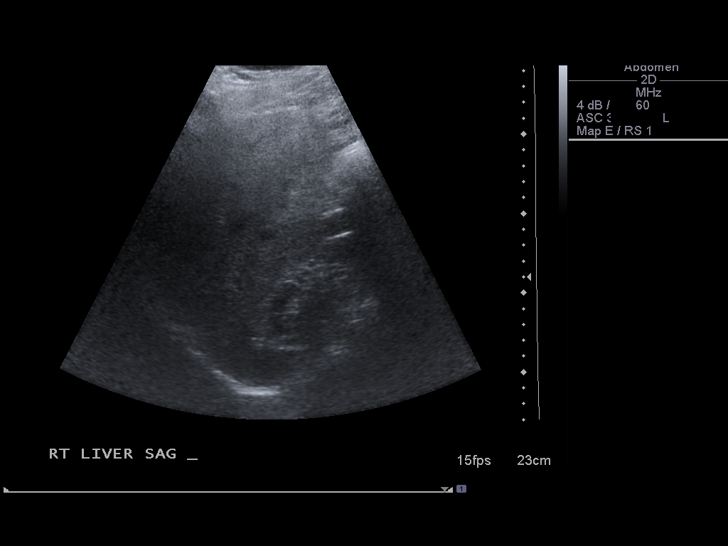
[im 26/76]
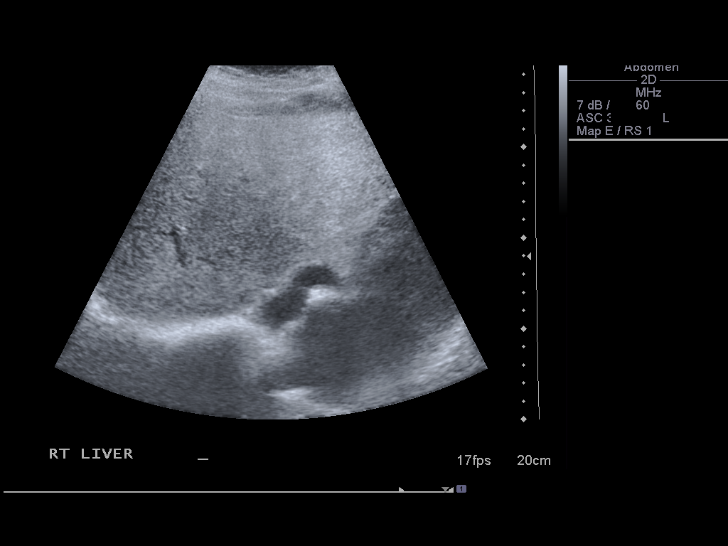
[im 29/76]
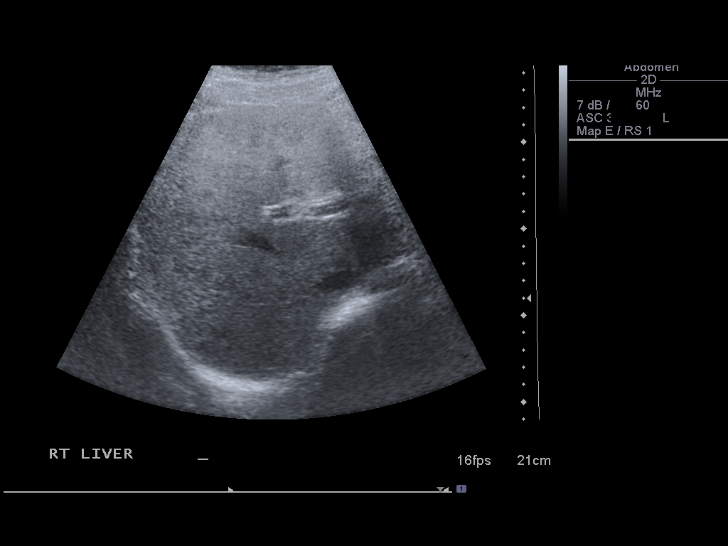
[im 35/76]
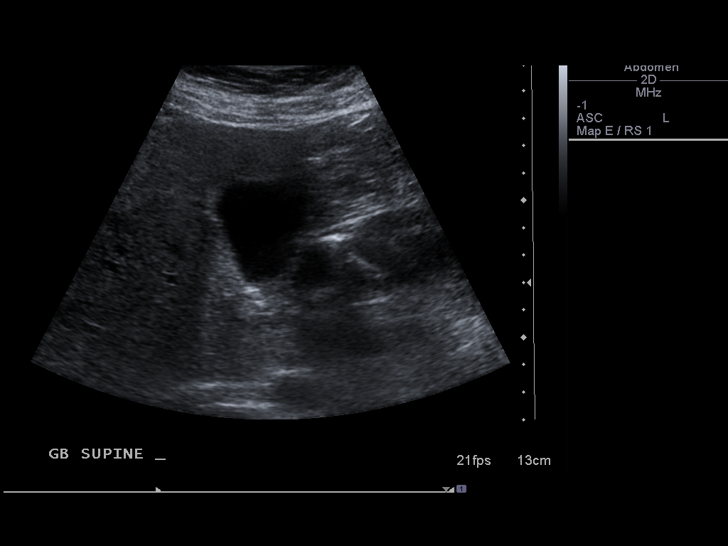
[im 41/76]
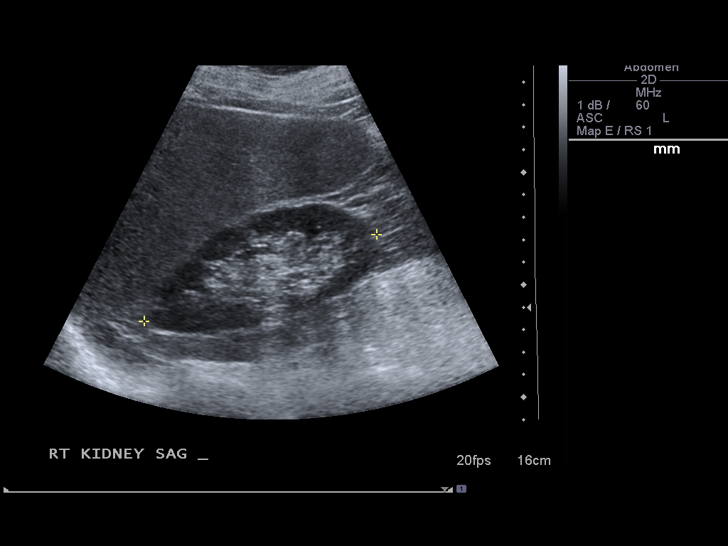
[im 47/76]
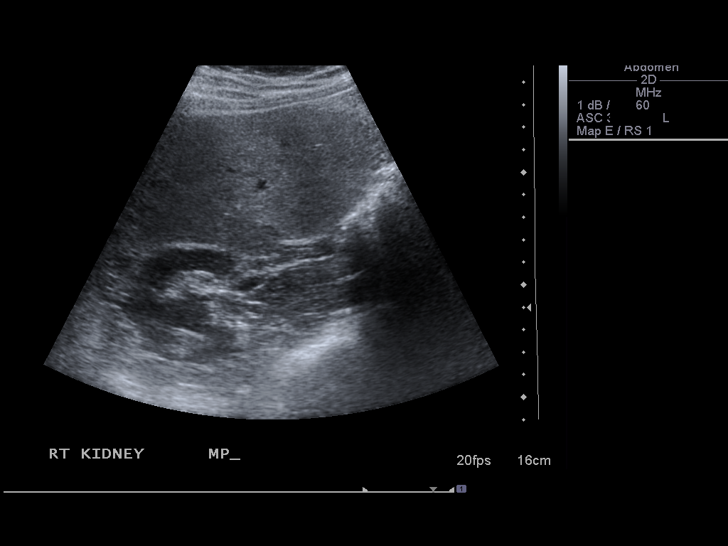
[im 51/76]
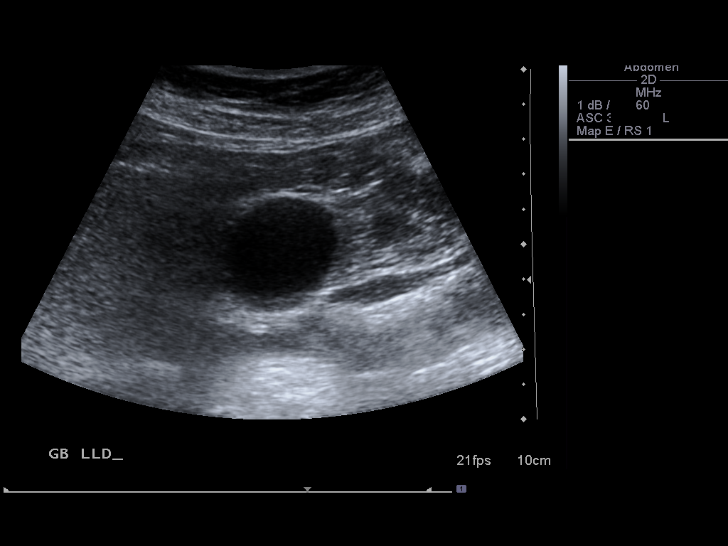
[im 57/76]
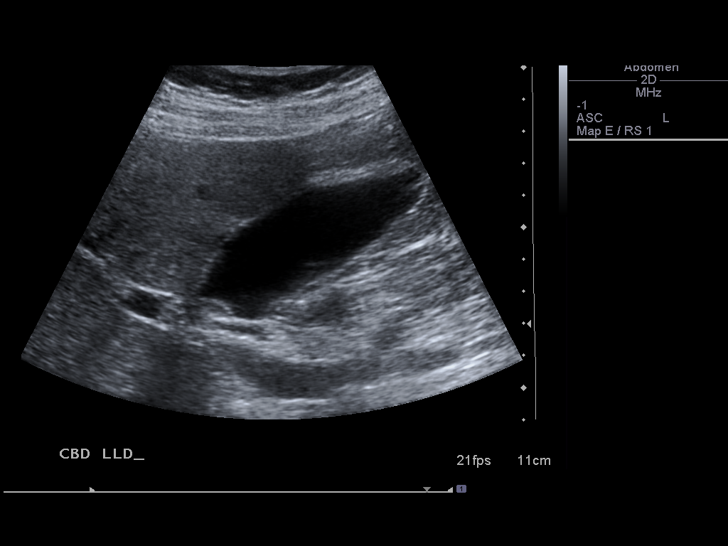
[im 63/76]
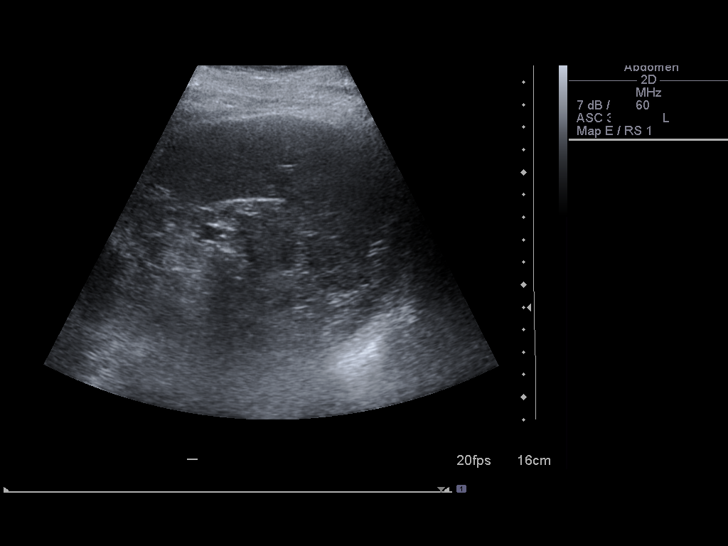
[im 69/76]
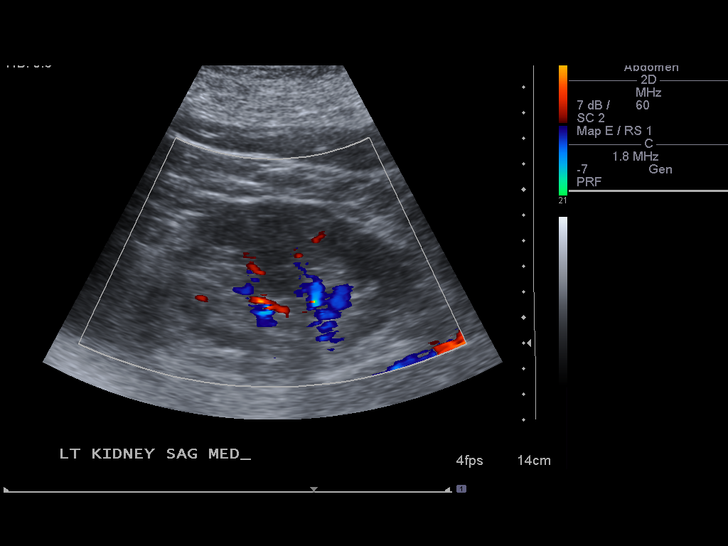
[im 76/76]
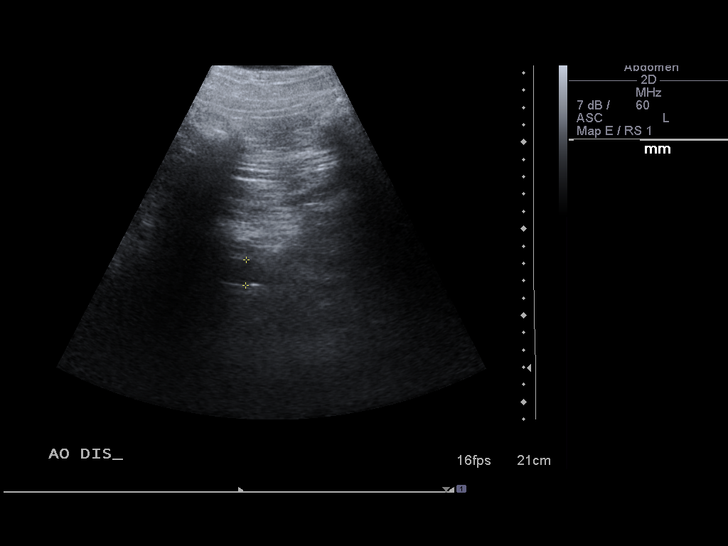

[14 of 25 positions shown; findings below may reference images not displayed]

FINDINGS: Gallbladder:  No gallstones, gallbladder wall thickening, or
pericholecystic fluid.

Common bile duct:   Normal caliber, 4 mm.

Liver:  Increased echotexture compatible with fatty infiltration.
No biliary ductal dilatation.

IVC:  Appears normal.

Pancreas:  No focal abnormality seen.

Spleen:  Within normal limits in size and echotexture.

Right Kidney:   Normal in size and parenchymal echogenicity.  No
evidence of mass or hydronephrosis.

Left Kidney:  Normal in size and parenchymal echogenicity.  No
evidence of mass or hydronephrosis.

Abdominal aorta:  No aneurysm identified.
IMPRESSION: Fatty infiltration of the liver.  No acute findings.

## 2013-01-08 MED ORDER — GI COCKTAIL ~~LOC~~
30.0000 mL | ORAL | Status: AC
  Administered 2013-01-08: 30 mL via ORAL
  Filled 2013-01-08: qty 30

## 2013-01-08 MED ORDER — WARFARIN SODIUM 5 MG PO TABS
5.0000 mg | ORAL_TABLET | Freq: Once | ORAL | Status: AC
  Administered 2013-01-08: 5 mg via ORAL
  Filled 2013-01-08: qty 1

## 2013-01-08 MED ORDER — OMEPRAZOLE 20 MG PO CPDR
40.0000 mg | DELAYED_RELEASE_CAPSULE | Freq: Every day | ORAL | Status: DC
  Administered 2013-01-08 – 2013-01-10 (×3): 40 mg via ORAL
  Filled 2013-01-08 (×3): qty 2

## 2013-01-08 MED ORDER — POLYETHYLENE GLYCOL 3350 17 G PO PACK
17.0000 g | PACK | Freq: Once | ORAL | Status: AC
  Administered 2013-01-08: 17 g via ORAL
  Filled 2013-01-08: qty 1

## 2013-01-08 MED ORDER — ALBUTEROL SULFATE (5 MG/ML) 0.5% IN NEBU
2.5000 mg | INHALATION_SOLUTION | RESPIRATORY_TRACT | Status: DC | PRN
  Administered 2013-01-08: 2.5 mg via RESPIRATORY_TRACT

## 2013-01-08 NOTE — Evaluation (Signed)
Physical Therapy Evaluation Patient Details Name: [name] MRN: [id number] DOB: 1951-02-05 Today's Date: 01/08/2013 Time: 3875-6433 PT Time Calculation (min): 37 min  PT Assessment / Plan / Recommendation History of Present Illness  Patient is a 62 yo female admitted following a fall.  Patient with chronic back pain.  MD had increased her pain med dose.  Patient reports pain meds made her feel "woozy" and patient fell hitting her head.  Reports she did not lose consciousness.  Patient with h/o COPD, OSA on CPAP, CHF, PAF, DM, fibromyalgia.  Clinical Impression  Patient presents with problems listed below.  Will benefit from acute PT to maximize independence and safety with mobility prior to return home.  Back pain and pain in LE's from edema impacting mobility.  Recommend f/u HHPT at discharge to continue therapy.    PT Assessment  Patient needs continued PT services    Follow Up Recommendations  Home health PT;Supervision - Intermittent    Does the patient have the potential to tolerate intense rehabilitation      Barriers to Discharge Decreased caregiver support Patient lives alone.  Family available prn    Equipment Recommendations  None recommended by PT    Recommendations for Other Services     Frequency Min 3X/week    Precautions / Restrictions Precautions Precautions: Fall Restrictions Weight Bearing Restrictions: No   Pertinent Vitals/Pain Pain BLE's due to edema 6/10      Mobility  Bed Mobility Bed Mobility: Supine to Sit;Sitting - Scoot to Edge of Bed;Sit to Supine Supine to Sit: 4: Min guard;With rails;HOB elevated Sitting - Scoot to Edge of Bed: 5: Supervision;With rail Sit to Supine: 4: Min guard;HOB elevated Details for Bed Mobility Assistance: Assist for safety with mobility. Patient able to perform with use of rail.  Patient with good sitting balance at EOB. Transfers Transfers: Sit to Stand;Stand to Sit Sit to Stand: 4: Min guard;With upper  extremity assist;From bed;From toilet Stand to Sit: 4: Min guard;With upper extremity assist;To toilet;To bed Details for Transfer Assistance: Verbal cues for technique/safety.   Ambulation/Gait Ambulation/Gait Assistance: 4: Min guard Ambulation Distance (Feet): 82 Feet Assistive device: Rolling walker Ambulation/Gait Assistance Details: Verbal cues for safe use of RW and to stand upright during gait.  Patient c/o BLE pain with weightbearing during gait due to edema. Gait Pattern: Step-through pattern;Decreased stride length;Shuffle;Trunk flexed Gait velocity: Slow gait speed    Exercises General Exercises - Lower Extremity Ankle Circles/Pumps: AROM;Both;10 reps;Supine   PT Diagnosis: Difficulty walking;Abnormality of gait;Generalized weakness;Acute pain  PT Problem List: Decreased strength;Decreased activity tolerance;Decreased balance;Decreased mobility;Decreased knowledge of use of DME;Cardiopulmonary status limiting activity;Pain PT Treatment Interventions: DME instruction;Gait training;Functional mobility training;Therapeutic exercise;Patient/family education     PT Goals(Current goals can be found in the care plan section) Acute Rehab PT Goals Patient Stated Goal: To be able to return home alone PT Goal Formulation: With patient Time For Goal Achievement: 01/15/13 Potential to Achieve Goals: Good  Visit Information  Last PT Received On: 01/08/13 Assistance Needed: +1 History of Present Illness: Patient is a 62 yo female admitted following a fall.  Patient with chronic back pain.  MD had increased her pain med dose.  Patient reports pain meds made her feel "woozy" and patient fell hitting her head.  Reports she did not lose consciousness.  Patient with h/o COPD, OSA on CPAP, CHF, PAF, DM, fibromyalgia.       Prior Functioning  Home Living Family/patient expects to be discharged to:: Private residence Living  Arrangements: Alone Available Help at Discharge: Family;Available  PRN/intermittently Type of Home: House Home Access: Stairs to enter [employer] of Steps: 1 Entrance Stairs-Rails: None Home Layout: One level Home Equipment: Shower seat;Bedside commode;Cane - single point;Walker - 2 wheels;Grab bars - toilet;Grab bars - tub/shower Additional Comments: Home O2 Prior Function Level of Independence: Independent with assistive device(s) (Uses cane when out) Comments: Sister drives patient to appts and for errands/groceries. Communication Communication: No difficulties    Cognition  Cognition Arousal/Alertness: Awake/alert Behavior During Therapy: WFL for tasks assessed/performed Overall Cognitive Status: Within Functional Limits for tasks assessed    Extremity/Trunk Assessment Upper Extremity Assessment Upper Extremity Assessment: Overall WFL for tasks assessed (Noted tremors in bil. UE's at rest and with mvt) Lower Extremity Assessment Lower Extremity Assessment: Generalized weakness (Edema in bil. LE's; Tremors with movement)   Balance Balance Balance Assessed: Yes Static Sitting Balance Static Sitting - Balance Support: No upper extremity supported;Feet supported Static Sitting - Level of Assistance: 5: Stand by assistance Static Sitting - Comment/# of Minutes: 8 Static Standing Balance Static Standing - Balance Support: No upper extremity supported Static Standing - Level of Assistance: 5: Stand by assistance Static Standing - Comment/# of Minutes: 2 minutes.    End of Session PT - End of Session Equipment Utilized During Treatment: Gait belt Activity Tolerance: Patient limited by pain;Patient limited by fatigue Patient left: in bed;with call bell/phone within reach Nurse Communication: Mobility status;Patient requests pain meds  GP Functional Assessment Tool Used: Clinical judgement Functional Limitation: Mobility: Walking and moving around Mobility: Walking and Moving Around Current Status [phone]): At least 1 percent but less  than 20 percent impaired, limited or restricted Mobility: Walking and Moving Around Goal Status [phone]): 0 percent impaired, limited or restricted   Despina Pole 01/08/2013, 2:14 PM Carita Pian. Sanjuana Kava, [place] Pager [phone]

## 2013-01-08 NOTE — Progress Notes (Signed)
ANTICOAGULATION CONSULT NOTE - Follow Up Consult  Pharmacy Consult for Coumadin Indication: atrial fibrillation  Allergies  Allergen Reactions  . Lorazepam     Patient's sister noted that ativan caused the patient to become extremely confused during hospitalization 09/2010  . Oxycontin [Oxycodone] Other (See Comments)    headache  . Tramadol Hcl     REACTION: swelling    Patient Measurements: Height: _0  (157.5 cm) Weight: 195 lb 14.4 oz (88.86 kg) (scale B) IBW/kg (Calculated) : 50.1 Heparin Dosing Weight:   Vital Signs: Temp: 98.1 F (36.7 C) (11/29 0656) Temp src: Oral (11/29 0656) BP: 136/5 mmHg (11/29 1237) Pulse Rate: 73 (11/29 1237)  Labs:  Recent Labs  01/07/13 0939 01/07/13 0943 01/08/13 0520  HGB 10.0*  --   --   HCT 30.2*  --   --   PLT 206  --   --   LABPROT 18.9*  --  19.4*  INR 1.63*  --  1.69*  CREATININE 1.47*  --  1.05  TROPONINI  --  <0.30  --     Estimated Creatinine Clearance: 56.8 ml/min (by C-G formula based on Cr of 1.05).  Assessment:: 62 yo female admitted with increasing weakness and dizziness and recent fall, likely due to pain mediciaton.  Anticoagulation: Chronic afib with admit INR 1.63. (11/19: Coumadin clinic INR=3. Coumadin 2.44m daily.) INR today up to 1.69.  Cardiovascular: Afib, Pulm HTN, CHF, HLD, mitral stenosis, CA stenosis, ECHO in 08/2012, EF of 55-60%. No signs of CHF now. 120/49, HR 78. Meds: Lipitor,   Endocrinology: SSI, Levemir. CBGs 141-163. HbA1c 6.7.   Gastrointestinal / Nutrition: GERD. Docusate. C/o abdominal cramping. No BM in a week.  Neurology: Anxiety/depression.Gabapentin, Nicotine patch, Zoloft  Nephrology: Scr 1.05 down  Pulmonary: Severe COPD on home O2, OSA, Dulera, Spiriva  Hematology / Oncology: Ferrous gluconate  PTA Medication Issues  Best Practices   Goal of Therapy:  INR 2-3 Monitor platelets by anticoagulation protocol: Yes   Plan:  Coumadin 536mx 1 more night.  RoEilene Ghazitillinger 01/08/2013,3:05 PM

## 2013-01-08 NOTE — Discharge Instructions (Addendum)
Please take all of your medications as directed Please follow-up with Dr. Eppie Gibson this Friday    Information on my medicine - Coumadin   (Warfarin)  This medication education was reviewed with me or my healthcare representative as part of my discharge preparation.  The pharmacist that spoke with me during my hospital stay was:  Wayland Salinas, [facility]  Why was Coumadin prescribed for you? Coumadin was prescribed for you because you have a blood clot or a medical condition that can cause an increased risk of forming blood clots. Blood clots can cause serious health problems by blocking the flow of blood to the heart, lung, or brain. Coumadin can prevent harmful blood clots from forming. As a reminder your indication for Coumadin is:   Select from menu  What test will check on my response to Coumadin? While on Coumadin (warfarin) you will need to have an INR test regularly to ensure that your dose is keeping you in the desired range. The INR (international normalized ratio) number is calculated from the result of the laboratory test called prothrombin time (PT).  If an INR APPOINTMENT HAS NOT ALREADY BEEN MADE FOR YOU please schedule an appointment to have this lab work done by your health care provider within 7 days. Your INR goal is usually a number between:  2 to 3 or your provider may give you a more narrow range like 2-2.5.  Ask your health care provider during an office visit what your goal INR is.  What  do you need to  know  About  COUMADIN? Take Coumadin (warfarin) exactly as prescribed by your healthcare provider about the same time each day.  DO NOT stop taking without talking to the doctor who prescribed the medication.  Stopping without other blood clot prevention medication to take the place of Coumadin may increase your risk of developing a new clot or stroke.  Get refills before you run out.  What do you do if you miss a dose? If you miss a dose, take it as soon as you  remember on the same day then continue your regularly scheduled regimen the next day.  Do not take two doses of Coumadin at the same time.  Important Safety Information A possible side effect of Coumadin (Warfarin) is an increased risk of bleeding. You should call your healthcare provider right away if you experience any of the following:   Bleeding from an injury or your nose that does not stop.   Unusual colored urine (red or dark brown) or unusual colored stools (red or black).   Unusual bruising for unknown reasons.   A serious fall or if you hit your head (even if there is no bleeding).  Some foods or medicines interact with Coumadin (warfarin) and might alter your response to warfarin. To help avoid this:   Eat a balanced diet, maintaining a consistent amount of Vitamin K.   Notify your provider about major diet changes you plan to make.   Avoid alcohol or limit your intake to 1 drink for women and 2 drinks for men per day. (1 drink is 5 oz. wine, 12 oz. beer, or 1.5 oz. liquor.)  Make sure that ANY health care provider who prescribes medication for you knows that you are taking Coumadin (warfarin).  Also make sure the healthcare provider who is monitoring your Coumadin knows when you have started a new medication including herbals and non-prescription products.  Coumadin (Warfarin)  Major Drug Interactions  Increased Warfarin Effect  Decreased Warfarin Effect  Alcohol (large quantities) Antibiotics (esp. Septra/Bactrim, Flagyl, Cipro) Amiodarone (Cordarone) Aspirin (ASA) Cimetidine (Tagamet) Megestrol (Megace) NSAIDs (ibuprofen, naproxen, etc.) Piroxicam (Feldene) Propafenone (Rythmol SR) Propranolol (Inderal) Isoniazid (INH) Posaconazole (Noxafil) Barbiturates (Phenobarbital) Carbamazepine (Tegretol) Chlordiazepoxide (Librium) Cholestyramine (Questran) Griseofulvin Oral Contraceptives Rifampin Sucralfate (Carafate) Vitamin K   Coumadin (Warfarin) Major Herbal  Interactions  Increased Warfarin Effect Decreased Warfarin Effect  Garlic Ginseng Ginkgo biloba Coenzyme Q10 Green tea St. John's wort    Coumadin (Warfarin) FOOD Interactions  Eat a consistent number of servings per week of foods HIGH in Vitamin K (1 serving =  cup)  Collards (cooked, or boiled & drained) Kale (cooked, or boiled & drained) Mustard greens (cooked, or boiled & drained) Parsley *serving size only =  cup Spinach (cooked, or boiled & drained) Swiss chard (cooked, or boiled & drained) Turnip greens (cooked, or boiled & drained)  Eat a consistent number of servings per week of foods MEDIUM-HIGH in Vitamin K (1 serving = 1 cup)  Asparagus (cooked, or boiled & drained) Broccoli (cooked, boiled & drained, or raw & chopped) Brussel sprouts (cooked, or boiled & drained) *serving size only =  cup Lettuce, raw (green leaf, endive, romaine) Spinach, raw Turnip greens, raw & chopped   These websites have more information on Coumadin (warfarin):  FailFactory.se; VeganReport.com.au;

## 2013-01-08 NOTE — Progress Notes (Signed)
Utilization Review Completed.   Kimberly Tucker, RN, BSN Nurse Case Manager  

## 2013-01-08 NOTE — Progress Notes (Signed)
Subjective: [name] is a 62 y.o. female w/ PMHx of severe COPD (Gold's III), pulmonary HTN, paroxysmal A-fib (s/p maze procedure), CHF, mitral stenosis, CA stenosis, OSA, DM type II, HLD, h/o RCCa, GERD, anxiety, and depression, presents to the ED w/ recent fall and complaints of associated weakness and dizziness, likely 2/2 pain medication intoxication.  Patient seen at bedside this AM, says she had a rough night, didn't sleep very well, feeling restless, still w/out a BM since Monday. Also complaining of abdominal cramping. Denies any dizziness, lightheadedness, fever, chills, nausea, and vomiting.   Objective: Vital signs in last 24 hours: Filed Vitals:   01/07/13 2110 01/07/13 2115 01/08/13 0202 01/08/13 0656  BP: 110/43 136/23 90/40 133/51  Pulse: 96 90 81 76  Temp:   97.9 F (36.6 C) 98.1 F (36.7 C)  TempSrc:   Oral Oral  Resp:   20 18  Height:      Weight:    195 lb 14.4 oz (88.86 kg)  SpO2:   96% 90%   Weight change:   Intake/Output Summary (Last 24 hours) at 01/08/13 0840 Last data filed at 01/08/13 0700  Gross per 24 hour  Intake 2002.5 ml  Output   1080 ml  Net  922.5 ml   Physical Exam: General: Alert, cooperative, and in no apparent distress HEENT: Vision grossly intact, oropharynx clear and non-erythematous  Neck: Full range of motion without pain, supple, no lymphadenopathy or carotid bruits Lungs: Clear to ascultation bilaterally, normal work of respiration, no wheezes, rales, ronchi Heart: Regular rate and rhythm, no murmurs, gallops, or rubs Abdomen: Soft, non-tender, non-distended, normal bowel sounds Extremities: No cyanosis, clubbing, or edema. Tenderness to palpation over calves, slight erythema bilaterally, not a new complaint.  Neurologic: Alert & oriented X3, cranial nerves II-XII intact, strength grossly intact, sensation intact to light touch  Lab Results: Basic Metabolic Panel:  Recent Labs Lab 01/07/13 0939 01/07/13 2155  01/08/13 0520  NA 136  --  140  K 4.6  --  4.5  CL 97  --  103  CO2 28  --  28  GLUCOSE 160*  --  140*  BUN 36*  --  25*  CREATININE 1.47*  --  1.05  CALCIUM 9.1  --  8.7  MG  --  2.4  --   PHOS  --  3.4  --    Liver Function Tests:  Recent Labs Lab 01/07/13 0939  AST 18  ALT 15  ALKPHOS 43  BILITOT 0.2*  PROT 6.4  ALBUMIN 3.6   CBC:  Recent Labs Lab 01/07/13 0939  WBC 9.8  NEUTROABS 7.6  HGB 10.0*  HCT 30.2*  MCV 94.7  PLT 206   Cardiac Enzymes:  Recent Labs Lab 01/07/13 0943  TROPONINI <0.30   CBG:  Recent Labs Lab 01/07/13 0900 01/07/13 2122 01/08/13 0556  GLUCAP 163* 160* 141*   Coagulation:  Recent Labs Lab 01/07/13 0939 01/08/13 0520  LABPROT 18.9* 19.4*  INR 1.63* 1.69*   Urine Drug Screen: Drugs of Abuse     Component Value Date/Time   LABOPIA POSITIVE* 01/07/2013 2114   COCAINSCRNUR NONE DETECTED 01/07/2013 2114   LABBENZ NONE DETECTED 01/07/2013 2114   AMPHETMU NONE DETECTED 01/07/2013 2114   THCU NONE DETECTED 01/07/2013 2114   LABBARB NONE DETECTED 01/07/2013 2114    Alcohol Level: No results found for this basename: ETH,  in the last 168 hours Urinalysis:  Recent Labs Lab 01/07/13 [place]  LABSPEC 1.014  PHURINE 5.0  GLUCOSEU NEGATIVE  HGBUR NEGATIVE  BILIRUBINUR NEGATIVE  KETONESUR NEGATIVE  PROTEINUR NEGATIVE  UROBILINOGEN 0.2  NITRITE NEGATIVE  LEUKOCYTESUR NEGATIVE   Studies/Results: Ct Head Wo Contrast  01/07/2013   CLINICAL DATA:  Dizziness.  Coumadin therapy.  EXAM: CT HEAD WITHOUT CONTRAST  TECHNIQUE: Contiguous axial images were obtained from the base of the skull through the vertex without intravenous contrast.  COMPARISON:  12/12/2011.  FINDINGS: No mass. No hydrocephalus. No hemorrhage. No acute bony abnormality. Paranasal sinuses mastoids are clear.  IMPRESSION: No acute abnormality.   Electronically Signed   By: [place] Gate   On: 01/07/2013 10:02   Dg Chest Port 1  View  01/07/2013   CLINICAL DATA:  Fall this morning.  Weakness and history of COPD.  EXAM: PORTABLE CHEST - 1 VIEW  COMPARISON:  Multiple priors  FINDINGS: The cardiomediastinal silhouette is likely unchanged given patient rotation. Prominence of the interstitial markings, with a chronic component. Superimposed pulmonary vascular congestion is not excluded. Mild bibasilar opacities, left greater than right, likely atelectasis. No pleural effusion or pneumothorax. No acute osseous abnormality.  IMPRESSION: 1. Prominence of the interstitial markings. A component of this is chronic. Superimposed pulmonary vascular congestion is not excluded.  2. Bibasilar opacities, left greater than right, likely atelectasis.   Electronically Signed   By: Donavan Burnet M.D.   On: 01/07/2013 09:29   Medications: I have reviewed the patient's current medications. Scheduled Meds: . atorvastatin  20 mg Oral q1800  . docusate sodium  100 mg Oral q morning - 10a  . ferrous gluconate  324 mg Oral BID WC  . fluticasone  2 spray Each Nare Daily  . gabapentin  300 mg Oral BID  . insulin aspart  0-15 Units Subcutaneous TID WC  . insulin detemir  36 Units Subcutaneous BID  . mometasone-formoterol  2 puff Inhalation BID  . mupirocin cream  1 application Topical Daily  . nicotine  21 mg Transdermal Daily  . sertraline  100 mg Oral Daily  . sodium chloride  3 mL Intravenous Q12H  . sodium chloride  3 mL Intravenous Q12H  . tiotropium  18 mcg Inhalation Daily  . Warfarin - Pharmacist Dosing Inpatient   Does not apply q1800   Continuous Infusions: . sodium chloride 75 mL/hr at 01/07/13 2050   PRN Meds:.sodium chloride, acetaminophen, albuterol, albuterol, ALPRAZolam, HYDROcodone-acetaminophen, sodium chloride Assessment/Plan: [name] is a 62 y.o. female w/ PMHx of severe COPD (Gold's III), pulmonary HTN, paroxysmal A-fib (s/p maze procedure), CHF, mitral stenosis, CA stenosis, OSA, DM type II, HLD, h/o  RCCa, GERD, anxiety, and depression, presents to the ED w/ recent fall and complaints of associated weakness and dizziness, likely 2/2 pain medication intoxication vs deconditioning vs orthostatic hypotension.  Fall/weakness/instability- Patient w/ recent fall after standing from sleeping. Complains of dizziness/lightheadedness prior to falling. Recent issues w/ hypotension and increase in MS Contin dose from 15 mg bid to 30 mg bid. Likely explanation for fall. Also reports h/o head trauma on fall. CT head in ED shows no acute abnormalities. Patient does not report palpitations, given history of episode, unlikely to be cardiogenic. No loss of consciousness noted. No history of seizures. Does not report dizziness or lightheadedness this AM.  -Continue to closely monitor IVF; giving NS @ 75 ml/hr. Cr improved today.  -Perform orthostatics to determine if orthostatic HoTN. Done yesterday, inconclusive, will repeat today. -PT/OT to see today. -Continue off MS Contin for now  as patient is not complaining of pain and says "she does not want to take it" at this time. Will add back @ 15 mg bid when necessary.   Constipation- Patient reports not having had a BM since Monday. Given Colace yesterday w/ no relief. BS are present on exam, patient does claim to be passing gas. -Will give Miralax today.  COPD- PFTs performed in 02/2012: FEV1 0.92 (40%), ratio 69, 27% increase in FEV1 with BD, TLC 91%, w/ severe airtrapping, DLCO 49% On chronic home O2. Continue home regimen. Complaining of some mild SOB and cough this AM, no different than baseline. -Spiriva, albuterol inhaler prn, Dulera  -Continue O2 via [place] @ 2L  -Nicoderm patch   DM type II- On Insulin Detemir 36 units bid + Novolog 18 units tid. Also on Lisinopril 10 mg qd. Most recent HbA1c 6.7.  -Continue Detemir 36 units bid  -ISS   CHF- Most recent ECHO in 08/2012, EF of 55-60%, w/ increased in a pattern of mild LVH and severely LA dilation. No signs of  CHF exacerbation at this time.  -Cautious w/ IVF's.   HLD- Continue Lipitor 20 mg qhs   Anxiety/depression- Continue home meds  Dispo: Disposition is deferred at this time, awaiting improvement of current medical problems.  Anticipated discharge in approximately 1-2 day(s).   The patient does have a current PCP Karren Cobble, MD) and does need an [facility] hospital follow-up appointment after discharge.  The patient does not have transportation limitations that hinder transportation to clinic appointments.  .Services Needed at time of discharge: Y = Yes, Blank = No PT:   OT:   RN:   Equipment:   Other:     LOS: 1 day   Corky Sox, MD 01/08/2013, 8:40 AM Pager: [phone]

## 2013-01-08 NOTE — H&P (Signed)
INTERNAL MEDICINE TEACHING SERVICE Attending Admission Note  Date: 01/08/2013  Patient name: [name]  Medical record number: [id number]  Date of birth: [birth year]    I have seen and evaluated [name] and discussed their care with the Residency Team.  62 yr old female with hx of GOLD stage 3 COPD, P-Afib on coumadin anticoagulation, HL, Type 2 DM w/ diabetic neuropathy, anxiety/depression, chronic lower back pain, presented due to fall. She states that she got up from bed to use the restroom and felt dizzy, causing her to lose her balance and fall. She admits she hit her head. She therefore presented to the ED. Initial VS included BP 94/43 mmHg, HR 91, RR 16, O2 sat of 95% on 2 L [place]. A CT head w/o contrast performed showed no evidence of intracranial bleed. Orthostatic vital signs were obtained, but these do not seem accurate. On exam, she is AAOx3 in NAD. Cardiac exam S1S2, no m/r/g, RRR. Pulmonary exam CTA bilat. C spine exam without midline tenderness and with FROM active and passive. Neurological exam without focal deficits noted.  She has no evidence of bruising on skin exam.  Of note, admission labs pertinent for BUN 36/Cr 1.47 which have improved to 25/1.05 this morning. She did receive a 1 L bolus os NS in the ED. Suspect this was pre-renal in etiology.  This morning she feels better. Orthostatic BP need to be repeated. She complains of chronic back pain and states she does not want to continue MS Contin at this time. She wants to take Norco for now. She states she has been constipated for 5 days and would like a laxative.    Obtain PT eval. If she is able to ambulate without dizziness and no recs for placement from PT, would feel comfortable D/C home with outpatient follow up. She can continue to hold MS Contin and discuss it with her PCP in hospital follow up visit within the next 1-2 weeks.  Dominic Pea, DO, [facility]  [facility] 01/08/2013, 12:37 PM

## 2013-01-08 NOTE — Progress Notes (Signed)
RT called to patient room to help put patient on her home CPAP at this time. RT will continue to assist as needed.

## 2013-01-09 ENCOUNTER — Observation Stay (HOSPITAL_COMMUNITY): Payer: Medicare Other

## 2013-01-09 DIAGNOSIS — R3989 Other symptoms and signs involving the genitourinary system: Secondary | ICD-10-CM

## 2013-01-09 DIAGNOSIS — I951 Orthostatic hypotension: Secondary | ICD-10-CM

## 2013-01-09 LAB — BASIC METABOLIC PANEL
BUN: 20 mg/dL (ref 6–23)
CO2: 24 mEq/L (ref 19–32)
Calcium: 8.8 mg/dL (ref 8.4–10.5)
Chloride: 103 mEq/L (ref 96–112)
Creatinine, Ser: 0.98 mg/dL (ref 0.50–1.10)
GFR calc Af Amer: 70 mL/min — ABNORMAL LOW (ref >90)
GFR calc non Af Amer: 60 mL/min — ABNORMAL LOW (ref >90)
Glucose, Bld: 165 mg/dL — ABNORMAL HIGH (ref 70–99)
Potassium: 4.3 mEq/L (ref 3.5–5.1)
Sodium: 139 mEq/L (ref 135–145)

## 2013-01-09 LAB — CBC
HCT: 27.8 % — ABNORMAL LOW (ref 36.0–46.0)
Hemoglobin: 9.1 g/dL — ABNORMAL LOW (ref 12.0–15.0)
MCH: 31.3 pg (ref 26.0–34.0)
MCHC: 32.7 g/dL (ref 30.0–36.0)
MCV: 95.5 fL (ref 78.0–100.0)
Platelets: 180 10*3/uL (ref 150–400)
RBC: 2.91 MIL/uL — ABNORMAL LOW (ref 3.87–5.11)
RDW: 15.9 % — ABNORMAL HIGH (ref 11.5–15.5)
WBC: 6 10*3/uL (ref 4.0–10.5)

## 2013-01-09 LAB — GLUCOSE, CAPILLARY
Glucose-Capillary: 146 mg/dL — ABNORMAL HIGH (ref 70–99)
Glucose-Capillary: 141 mg/dL — ABNORMAL HIGH (ref 70–99)
Glucose-Capillary: 129 mg/dL — ABNORMAL HIGH (ref 70–99)
Glucose-Capillary: 119 mg/dL — ABNORMAL HIGH (ref 70–99)

## 2013-01-09 LAB — PROTIME-INR
INR: 2.08 — ABNORMAL HIGH (ref 0.00–1.49)
Prothrombin Time: 22.7 seconds — ABNORMAL HIGH (ref 11.6–15.2)

## 2013-01-09 MED ORDER — GADOBENATE DIMEGLUMINE 529 MG/ML IV SOLN
19.0000 mL | Freq: Once | INTRAVENOUS | Status: AC | PRN
  Administered 2013-01-09: 19 mL via INTRAVENOUS

## 2013-01-09 MED ORDER — ONDANSETRON HCL 4 MG PO TABS
4.0000 mg | ORAL_TABLET | Freq: Three times a day (TID) | ORAL | Status: DC | PRN
  Administered 2013-01-09: 4 mg via ORAL
  Filled 2013-01-09: qty 1

## 2013-01-09 MED ORDER — WARFARIN SODIUM 2.5 MG PO TABS
2.5000 mg | ORAL_TABLET | Freq: Every day | ORAL | Status: DC
  Administered 2013-01-09: 2.5 mg via ORAL
  Filled 2013-01-09 (×2): qty 1

## 2013-01-09 MED ORDER — ALPRAZOLAM 0.5 MG PO TABS
0.5000 mg | ORAL_TABLET | Freq: Once | ORAL | Status: AC
  Administered 2013-01-09: 0.5 mg via ORAL
  Filled 2013-01-09: qty 1

## 2013-01-09 MED ORDER — FERROUS GLUCONATE 324 (38 FE) MG PO TABS: 324.0000 mg | ORAL_TABLET | Freq: Two times a day (BID) | ORAL | Status: DC

## 2013-01-09 MED ORDER — MORPHINE SULFATE ER 15 MG PO TBCR
15.0000 mg | EXTENDED_RELEASE_TABLET | Freq: Two times a day (BID) | ORAL | Status: DC
  Administered 2013-01-09: 15 mg via ORAL
  Filled 2013-01-09: qty 1

## 2013-01-09 NOTE — Progress Notes (Signed)
  Date: 01/09/2013  Patient name: [name]  Medical record number: [id number]  Date of birth: [birth year]   This patient has been seen and the plan of care was discussed with the house staff. Please see their note for complete details. I concur with their findings with the following additions/corrections: Feels better this morning. She is concerned about her Left hip and lower back pain. She states she gets lower back pain when she walks does not completely resolved on resting. She states she has a very mild BM yesterday, not like her usual: "mine usually clog the toilet". She states she does not want to take MS Contin at this time for pain, states controlled on PRN Norco. She was concerned about the etiology of her back pain. I explained to her previous imaging results, but she was not satisfied with these results. I asked her what else we can do for her and she stated she wants her back imaged and her hip imaged. She was concerned about a small lesion on her left hip which she stated recently "burst".  On exam, GEN: AAOx3, NAD CV: S1S2, no m/r/g, RRR PULM: CTA bilat. MSK: No midline tenderness C/T/L spine. She has 5/5 strength in bilat LE. No sensory deficits in bilat LE. LE: 2/4 pulses, no edema. Skin: 1x2 cm ulcer on L lateral hip with granulation tissue forming, mild surrounding erythema, no fluctuance noted.  Reviewed MRI results of L spine: She has known and persistent L5-S1 mild facet hypertrophy without stenosis. No other acute abnormalities. Xray L hip: No acute abnormalities.  At this time, likely cause of fall was volume depletion, leading to orthostatic hypotension and pre-renal azotemia. This has all resolved with IV volume replacement. Need PT to see her for final recs. Agree with holding MS Contin per patient preference and using PRN Norco. Expect D/C later today or tomorrow morning.  Dominic Pea, DO, [facility] 01/09/2013, 3:18 PM

## 2013-01-09 NOTE — Progress Notes (Signed)
PT Cancellation Note  Patient Details Name: [name] MRN: [id number] DOB: May 27, 1950   Cancelled Treatment:    Reason Eval/Treat Not Completed: Patient at procedure or test/unavailable, Pt at MRI    [name] 01/09/2013, 11:40 AM Elwyn Reach, [place]

## 2013-01-09 NOTE — Progress Notes (Signed)
Subjective: [name] is a 62 y.o. female w/ PMHx of severe COPD (Gold's III), pulmonary HTN, paroxysmal A-fib (s/p maze procedure), CHF, mitral stenosis, CA stenosis, OSA, DM type II, HLD, h/o RCCa, GERD, anxiety, and depression, presents to the ED w/ recent fall and complaints of associated weakness and dizziness, likely 2/2 pain medication intoxication.  Patient reporting nausea. She still complains of pain in her legs. She believes that her fall was from medications.  She also reports pain in the left hip even though she denies falls. She has a small wound on her left waist line which she attributed to a scratch. No other symptoms today.    Objective: Vital signs in last 24 hours: Filed Vitals:   01/09/13 0300 01/09/13 0518 01/09/13 0905 01/09/13 1450  BP: 114/50 127/48  131/44  Pulse: 80 63  65  Temp:  97 F (36.1 C)  97.1 F (36.2 C)  TempSrc:  Axillary  Axillary  Resp:  18  19  Height:      Weight:  199 lb 3.2 oz (90.357 kg)    SpO2:  97% 95% 96%   Weight change: 3 lb 6.9 oz (1.556 kg)  Intake/Output Summary (Last 24 hours) at 01/09/13 1704 Last data filed at 01/09/13 1145  Gross per 24 hour  Intake    900 ml  Output    925 ml  Net    -25 ml   Physical Exam: General: Alert, cooperative, and in no apparent distress HEENT: Vision grossly intact, oropharynx clear and non-erythematous  Neck: Full range of motion without pain, supple, no lymphadenopathy or carotid bruits Lungs: Clear to ascultation bilaterally, normal work of respiration, no wheezes, rales, ronchi Heart: Regular rate and rhythm, no murmurs, gallops, or rubs Abdomen: Soft, non-tender, non-distended, normal bowel sounds Extremities: No cyanosis, clubbing, or edema. Tenderness to palpation over calves, slight erythema bilaterally, not a new complaint. Small nonseptic ulcer at the lateral left hip.  Neurologic: Alert & oriented X3. Good strength in all extremities.   Lab Results: Basic  Metabolic Panel:  Recent Labs Lab 01/07/13 2155 01/08/13 0520 01/09/13 0530  NA  --  140 139  K  --  4.5 4.3  CL  --  103 103  CO2  --  28 24  GLUCOSE  --  140* 165*  BUN  --  25* 20  CREATININE  --  1.05 0.98  CALCIUM  --  8.7 8.8  MG 2.4  --   --   PHOS 3.4  --   --    Liver Function Tests:  Recent Labs Lab 01/07/13 0939  AST 18  ALT 15  ALKPHOS 43  BILITOT 0.2*  PROT 6.4  ALBUMIN 3.6   CBC:  Recent Labs Lab 01/07/13 0939 01/09/13 0530  WBC 9.8 6.0  NEUTROABS 7.6  --   HGB 10.0* 9.1*  HCT 30.2* 27.8*  MCV 94.7 95.5  PLT 206 180   Cardiac Enzymes:  Recent Labs Lab 01/07/13 0943  TROPONINI <0.30   CBG:  Recent Labs Lab 01/08/13 0556 01/08/13 1049 01/08/13 1548 01/08/13 2059 01/09/13 0625 01/09/13 1614  GLUCAP 141* 153* 131* 146* 141* 119*   Coagulation:  Recent Labs Lab 01/07/13 0939 01/08/13 0520 01/09/13 0530  LABPROT 18.9* 19.4* 22.7*  INR 1.63* 1.69* 2.08*   U  Studies/Results: Dg Hip Complete Left  01/09/2013   CLINICAL DATA:  Pain in left hip.  No known injury.  EXAM: LEFT HIP - COMPLETE 2+ VIEW  COMPARISON:  None.  FINDINGS: There is no evidence of hip fracture or dislocation. Left hip is located. Slight joint space narrowing of both hips. Negative for osteophyte formation. Sacroiliac joints and pubic symphysis appear normal  IMPRESSION: No acute bony abnormality.   Electronically Signed   By: Curlene Dolphin M.D.   On: 01/09/2013 11:37   Mr Lumbar Spine W Wo Contrast  01/09/2013   CLINICAL DATA:  62 year old female status post fall with severe back pain. Initial encounter. History of left renal carcinoma cryoablation and 2013.  EXAM: MRI LUMBAR SPINE WITHOUT AND WITH CONTRAST  TECHNIQUE: Multiplanar and multiecho pulse sequences of the lumbar spine were obtained without and with intravenous contrast.  CONTRAST:  45m MULTIHANCE GADOBENATE DIMEGLUMINE 529 MG/ML IV SOLN  COMPARISON:  Lumbar radiographs 11/01/2012. CT Abdomen and  Pelvis 08/25/2012. Lumbar MRI 10/23/2004.  FINDINGS: Normal lumbar segmentation depicted in September. Stable and normal vertebral height and alignment since 2006. Visible bone marrow signal is stable and normal. No marrow edema or evidence of acute osseous abnormality.  Incidental low back subcutaneous edema. There may be a chronic left posterior paraspinal lipoma (series 4, image 13), unchanged since 2006.  The bladder is distended. Motion artifact through the upper abdominal viscera. Grossly stable left renal lower pole with post treatment stranding. No retroperitoneal lymphadenopathy.  Visualized lower thoracic spinal cord is normal with conus medularis at T12-L1. No abnormal intradural enhancement. Normal cauda equina.  T11-T12:  Negative.  T12-L1:  Negative.  L1-L2:  Negative.  L2-L3:  Negative.  L3-L4:  Negative.  L4-L5:  Negative except for mild facet hypertrophy.  L5-S1: Negative except for mild facet hypertrophy and epidural lipomatosis. No stenosis.  IMPRESSION: 1. No acute or metastatic process in the lumbar spine. No acute traumatic injury identified. 2. Normal discs. No significant degenerative change. No spinal stenosis or neural impingement.   Electronically Signed   By: LLars PinksM.D.   On: 01/09/2013 13:23   Medications: I have reviewed the patient's current medications. Scheduled Meds: . ALPRAZolam  0.5 mg Oral Once  . atorvastatin  20 mg Oral q1800  . docusate sodium  100 mg Oral q morning - 10a  . ferrous gluconate  324 mg Oral BID WC  . fluticasone  2 spray Each Nare Daily  . gabapentin  300 mg Oral BID  . insulin aspart  0-15 Units Subcutaneous TID WC  . insulin detemir  36 Units Subcutaneous BID  . mometasone-formoterol  2 puff Inhalation BID  . morphine  15 mg Oral Q12H  . mupirocin cream  1 application Topical Daily  . nicotine  21 mg Transdermal Daily  . omeprazole  40 mg Oral Daily  . sertraline  100 mg Oral Daily  . tiotropium  18 mcg Inhalation Daily  . warfarin  2.5  mg Oral q1800  . Warfarin - Pharmacist Dosing Inpatient   Does not apply q1800   Continuous Infusions:   PRN Meds:.sodium chloride, acetaminophen, albuterol, albuterol, albuterol, ALPRAZolam, HYDROcodone-acetaminophen, ondansetron, sodium chloride Assessment/Plan: [name] STEIGis a 62y.o. female w/ PMHx of severe COPD (Gold's III), pulmonary HTN, paroxysmal A-fib (s/p maze procedure), CHF, mitral stenosis, CA stenosis, OSA, DM type II, HLD, h/o RCCa, GERD, anxiety, and depression, presents to the ED w/ recent fall and complaints of associated weakness and dizziness, likely 2/2 pain medication intoxication vs deconditioning vs orthostatic hypotension.  Fall/weakness/instability- Patient w/ recent fall after standing from sleeping. Presented with complains of dizziness/lightheadedness prior to falling. Recent issues  w/ hypotension and increase in MS Contin dose from 15 mg bid to 30 mg bid. Likely explanation for fall. Also reports h/o head trauma on fall. CT head in ED shows no acute abnormalities. Orthostatic - negative   Plan  - d/c IVF - Cont with oral intake  - patient missed PT/OT as was in MRI. Will do PT before discharge - Lumbar spine MRI and left hip xray are unremarkable -Continue off MS Contin. Will not restart MS Contin at discharge  - continue with Vicodin prn for pain  - will follow up in outpatient for options of pain management   Constipation- This is a chronic problem. She reports that she uses Enemas frequently at home.  Plan  -cont with Miralax  - enema with soapy water   COPD- PFTs performed in 02/2012: FEV1 0.92 (40%), ratio 69, 27% increase in FEV1 with BD, TLC 91%, w/ severe airtrapping, DLCO 49% On chronic home O2. Continue home regimen.  -Spiriva, albuterol inhaler prn, Dulera  -Continue O2 via [place] @ 2L  -Nicoderm patch  - CPAP QHS  DM type II- On Insulin Detemir 36 units bid + Novolog 18 units tid. Also on Lisinopril 10 mg qd. Most recent HbA1c 6.7.   -Continue Detemir 36 units bid  -ISS   CHF- Most recent ECHO in 08/2012, EF of 55-60%, w/ increased in a pattern of mild LVH and severely LA dilation. No signs of CHF exacerbation at this time.  -Cautious w/ IVF's.   HLD- Continue Lipitor 20 mg qhs   Anxiety/depression- Continue home meds  Dispo: Disposition is deferred at this time, awaiting improvement of current medical problems.  Anticipated discharge tomorrow after PT evaluation with recommendation.    The patient does have a current PCP Karren Cobble, MD) and does need an [facility] hospital follow-up appointment after discharge.  The patient does not have transportation limitations that hinder transportation to clinic appointments.  .Services Needed at time of discharge: Y = Yes, Blank = No PT:   OT:   RN:   Equipment:   Other:     LOS: 2 days   Jessee Avers, MD 01/09/2013, 5:04 PM Pager: [phone]

## 2013-01-09 NOTE — Progress Notes (Signed)
ANTICOAGULATION CONSULT NOTE - Follow Up Consult  Pharmacy Consult for Coumadin Indication: atrial fibrillation  Allergies  Allergen Reactions  . Lorazepam     Patient's sister noted that ativan caused the patient to become extremely confused during hospitalization 09/2010  . Oxycontin [Oxycodone] Other (See Comments)    headache  . Tramadol Hcl     REACTION: swelling    Patient Measurements: Height: _0  (157.5 cm) Weight: 199 lb 3.2 oz (90.357 kg) (scale b) IBW/kg (Calculated) : 50.1 Heparin Dosing Weight:   Vital Signs: Temp: 97.1 F (36.2 C) (11/30 1450) Temp src: Axillary (11/30 1450) BP: 131/44 mmHg (11/30 1450) Pulse Rate: 65 (11/30 1450)  Labs:  Recent Labs  01/07/13 0939 01/07/13 0943 01/08/13 0520 01/09/13 0530  HGB 10.0*  --   --  9.1*  HCT 30.2*  --   --  27.8*  PLT 206  --   --  180  LABPROT 18.9*  --  19.4* 22.7*  INR 1.63*  --  1.69* 2.08*  CREATININE 1.47*  --  1.05 0.98  TROPONINI  --  <0.30  --   --     Estimated Creatinine Clearance: 61.4 ml/min (by C-G formula based on Cr of 0.98).  Assessment:: 62 yo female admitted with increasing weakness and dizziness and recent fall, likely due to increased pain mediciaton dose.  Anticoagulation: Chronic afib. (11/19: Coumadin clinic INR=3. Coumadin 2.33m daily.) INR today up to 2.08  Cardiovascular: Afib, Pulm HTN, CHF, HLD, mitral stenosis, CA stenosis, ECHO in 08/2012, EF of 55-60%. No signs of CHF now. 131/44, HR 65. Meds: Lipitor,   Endocrinology: SSI, Levemir. CBGs 141-163. HbA1c 6.7.   Gastrointestinal / Nutrition: GERD. Docusate. C/o abdominal cramping. No BM in a week.  Neurology: Anxiety/depression: Gabapentin, Nicotine patch, Zoloft, MS Contin. Chronic back pain.    Ortho:MRI results of L spine: She has known and persistent L5-S1 mild facet hypertrophy without stenosis. No other acute abnormalities.  Xray L hip: No acute abnormalities.   Nephrology: Scr 0.98 down  Pulmonary: Severe  COPD on home O2, OSA, on Flonase,Dulera, Spiriva  Hematology / Oncology: Ferrous gluconate for Hgb 9.1  PTA Medication Issues  Best Practices   Goal of Therapy:  INR 2-3 Monitor platelets by anticoagulation protocol: Yes   Plan:  Coumadin 2.547mdaily  Crystal S. RoAlford HighlandPharmD, BCPS Clinical Staff Pharmacist Pager [phone] Ghazitillinger 01/09/2013,3:28 PM

## 2013-01-10 DIAGNOSIS — R42 Dizziness and giddiness: Secondary | ICD-10-CM

## 2013-01-10 DIAGNOSIS — I5030 Unspecified diastolic (congestive) heart failure: Secondary | ICD-10-CM

## 2013-01-10 DIAGNOSIS — Z9181 History of falling: Secondary | ICD-10-CM

## 2013-01-10 LAB — CBC
HCT: 29.5 % — ABNORMAL LOW (ref 36.0–46.0)
Hemoglobin: 9.8 g/dL — ABNORMAL LOW (ref 12.0–15.0)
MCH: 31.6 pg (ref 26.0–34.0)
MCHC: 33.2 g/dL (ref 30.0–36.0)
MCV: 95.2 fL (ref 78.0–100.0)
Platelets: 198 10*3/uL (ref 150–400)
RBC: 3.1 MIL/uL — ABNORMAL LOW (ref 3.87–5.11)
RDW: 15.6 % — ABNORMAL HIGH (ref 11.5–15.5)
WBC: 7.9 10*3/uL (ref 4.0–10.5)

## 2013-01-10 LAB — PROTIME-INR
INR: 2.45 — ABNORMAL HIGH (ref 0.00–1.49)
Prothrombin Time: 25.8 seconds — ABNORMAL HIGH (ref 11.6–15.2)

## 2013-01-10 LAB — GLUCOSE, CAPILLARY
Glucose-Capillary: 148 mg/dL — ABNORMAL HIGH (ref 70–99)
Glucose-Capillary: 82 mg/dL (ref 70–99)
Glucose-Capillary: 134 mg/dL — ABNORMAL HIGH (ref 70–99)

## 2013-01-10 NOTE — Progress Notes (Signed)
Physical Therapy Treatment Patient Details Name: DEZIREA MCCOLLISTER MRN: [id number] DOB: 1950/04/07 Today's Date: 01/10/2013 Time: 5643-3295 PT Time Calculation (min): 28 min  PT Assessment / Plan / Recommendation  History of Present Illness Patient is a 62 yo female admitted following a fall.  Patient with chronic back pain.  MD had increased her pain med dose.  Patient reports pain meds made her feel "woozy" and patient fell hitting her head.  Reports she did not lose consciousness.  Patient with h/o COPD, OSA on CPAP, CHF, PAF, DM, fibromyalgia.   PT Comments   [place] better, without shuffle or trunk flexed, compared to last session; Noted likely for dc today  Follow Up Recommendations  Home health PT;Supervision - Intermittent     Does the patient have the potential to tolerate intense rehabilitation     Barriers to Discharge        Equipment Recommendations  None recommended by PT    Recommendations for Other Services    Frequency Min 3X/week   Progress towards PT Goals Progress towards PT goals: Progressing toward goals  Plan Current plan remains appropriate    Precautions / Restrictions Precautions Precautions: Fall Restrictions Weight Bearing Restrictions: No   Pertinent Vitals/Pain VSS no apparent distress Though reports a headache coming on    Mobility  Bed Mobility Bed Mobility: Supine to Sit;Sitting - Scoot to [employer] of Bed;Sit to Supine;Scooting to [facility] Supine to Sit: 6: Modified independent (Device/Increase time) Sitting - Scoot to Edge of Bed: 6: Modified independent (Device/Increase time) Sit to Supine: 6: Modified independent (Device/Increase time) Scooting to [facility]: 6: Modified independent (Device/Increase time) Transfers Transfers: Sit to Stand;Stand to Sit Sit to Stand: 5: Supervision;From bed Stand to Sit: 5: Supervision;With armrests;To chair/3-in-1 Details for Transfer Assistance: Verbal cues for technique/safety.   Ambulation/Gait Ambulation/Gait  Assistance: 5: Supervision Ambulation Distance (Feet): 110 Feet Assistive device: 1 person hand held assist;Other (Comment) (and hallway rail) Ambulation/Gait Assistance Details: Opted not to use rW in an effort to simulate walking at home; one episode of dizziness, which subsided quickly Gait Pattern: Step-through pattern;Decreased stride length Stairs:  (declined stair training)    Exercises     PT Diagnosis:    PT Problem List:   PT Treatment Interventions:     PT Goals (current goals can now be found in the care plan section) Acute Rehab PT Goals Patient Stated Goal: To be able to return home alone Time For Goal Achievement: 01/15/13 Potential to Achieve Goals: Good  Visit Information  Last PT Received On: 01/10/13 Assistance Needed: +1 History of Present Illness: Patient is a 62 yo female admitted following a fall.  Patient with chronic back pain.  MD had increased her pain med dose.  Patient reports pain meds made her feel "woozy" and patient fell hitting her head.  Reports she did not lose consciousness.  Patient with h/o COPD, OSA on CPAP, CHF, PAF, DM, fibromyalgia.    Subjective Data  Subjective: Wanting to go home Patient Stated Goal: To be able to return home alone   Cognition  Cognition Arousal/Alertness: Awake/alert Behavior During Therapy: WFL for tasks assessed/performed Overall Cognitive Status: Within Functional Limits for tasks assessed    Balance  Balance Balance Assessed: Yes Static Sitting Balance Static Sitting - Balance Support: No upper extremity supported;Feet supported Static Sitting - Level of Assistance: 7: Independent Dynamic Sitting Balance Dynamic Sitting - Balance Support: No upper extremity supported;Feet unsupported;During functional activity Dynamic Sitting - Level of Assistance: 7: Independent Static Standing  Balance Static Standing - Balance Support: No upper extremity supported;During functional activity Static Standing - Level of  Assistance: 5: Stand by assistance Dynamic Standing Balance Dynamic Standing - Balance Support: No upper extremity supported;During functional activity Dynamic Standing - Level of Assistance: 5: Stand by assistance  End of Session PT - End of Session Activity Tolerance: Patient tolerated treatment well Patient left: in chair;with call bell/phone within reach Nurse Communication: Mobility status   GP Functional Assessment Tool Used: Clinical judgement Functional Limitation: Mobility: Walking and moving around Mobility: Walking and Moving Around Current Status ([id number]): 0 percent impaired, limited or restricted Mobility: Walking and Moving Around Discharge Status [phone]): 0 percent impaired, limited or restricted  Roney Marion, [place] McGrath  Roney Marion [facility] 01/10/2013, 4:14 PM

## 2013-01-10 NOTE — Progress Notes (Signed)
Subjective:  Pt has no new complaints today and reports she feels better.  She denies any episodes of CP, SOB, dizziness, or SOB.  She reports a BM yesterday with an enema.    Objective: Vital signs in last 24 hours: Filed Vitals:   01/10/13 0633 01/10/13 0833 01/10/13 1056 01/10/13 1349  BP: 141/59  114/42 136/42  Pulse: 90  67 68  Temp: 97.8 F (36.6 C)   97.8 F (36.6 C)  TempSrc: Oral   Oral  Resp: 18   18  Height:      Weight: 89.858 kg (198 lb 1.6 oz)     SpO2: 95% 92%  96%   Weight change: -0.499 kg (-1 lb 1.6 oz)  Intake/Output Summary (Last 24 hours) at 01/10/13 1600 Last data filed at 01/10/13 1342  Gross per 24 hour  Intake    900 ml  Output   1825 ml  Net   -925 ml   Physical Exam: Constitutional: Vital signs reviewed.  Patient is well-developed and well-nourished in no acute distress and cooperative with exam.   Head: Normocephalic and atraumatic Eyes: PERRLA, EOMI, conjunctivae normal, No scleral icterus.  Neck: Supple, Trachea midline. Cardiovascular: RRR, S1 normal, S2 normal, no MRG, pulses symmetric and intact bilaterally Pulmonary/Chest: normal respiratory effort, CTAB, no wheezes, rales, or rhonchi Abdominal: Soft. Non-tender, non-distended, bowel sounds are normal Musculoskeletal: No joint deformities or erythema noted Neurological: A&O x3, Strength is normal and symmetric bilaterally, cranial nerve II-XII are grossly intact, moving all extremities.  Skin: Warm, dry and intact. No rash, cyanosis, or clubbing.   Lab Results: Basic Metabolic Panel:  Recent Labs Lab 01/07/13 2155 01/08/13 0520 01/09/13 0530  NA  --  140 139  K  --  4.5 4.3  CL  --  103 103  CO2  --  28 24  GLUCOSE  --  140* 165*  BUN  --  25* 20  CREATININE  --  1.05 0.98  CALCIUM  --  8.7 8.8  MG 2.4  --   --   PHOS 3.4  --   --    Liver Function Tests:  Recent Labs Lab 01/07/13 0939  AST 18  ALT 15  ALKPHOS 43  BILITOT 0.2*  PROT 6.4  ALBUMIN 3.6    CBC:  Recent Labs Lab 01/07/13 0939 01/09/13 0530 01/10/13 0900  WBC 9.8 6.0 7.9  NEUTROABS 7.6  --   --   HGB 10.0* 9.1* 9.8*  HCT 30.2* 27.8* 29.5*  MCV 94.7 95.5 95.2  PLT 206 180 198   Cardiac Enzymes:  Recent Labs Lab 01/07/13 0943  TROPONINI <0.30   CBG:  Recent Labs Lab 01/09/13 0625 01/09/13 1229 01/09/13 1614 01/09/13 2132 01/10/13 0602 01/10/13 1111  GLUCAP 141* 129* 119* 148* 82 134*   Coagulation:  Recent Labs Lab 01/07/13 0939 01/08/13 0520 01/09/13 0530 01/10/13 0545  LABPROT 18.9* 19.4* 22.7* 25.8*  INR 1.63* 1.69* 2.08* 2.45*   U  Studies/Results: Dg Hip Complete Left  01/09/2013   CLINICAL DATA:  Pain in left hip.  No known injury.  EXAM: LEFT HIP - COMPLETE 2+ VIEW  COMPARISON:  None.  FINDINGS: There is no evidence of hip fracture or dislocation. Left hip is located. Slight joint space narrowing of both hips. Negative for osteophyte formation. Sacroiliac joints and pubic symphysis appear normal  IMPRESSION: No acute bony abnormality.   Electronically Signed   By: Curlene Dolphin M.D.   On: 01/09/2013 11:37  Mr Lumbar Spine W Wo Contrast  01/09/2013   CLINICAL DATA:  62 year old female status post fall with severe back pain. Initial encounter. History of left renal carcinoma cryoablation and 2013.  EXAM: MRI LUMBAR SPINE WITHOUT AND WITH CONTRAST  TECHNIQUE: Multiplanar and multiecho pulse sequences of the lumbar spine were obtained without and with intravenous contrast.  CONTRAST:  67m MULTIHANCE GADOBENATE DIMEGLUMINE 529 MG/ML IV SOLN  COMPARISON:  Lumbar radiographs 11/01/2012. CT Abdomen and Pelvis 08/25/2012. Lumbar MRI 10/23/2004.  FINDINGS: Normal lumbar segmentation depicted in September. Stable and normal vertebral height and alignment since 2006. Visible bone marrow signal is stable and normal. No marrow edema or evidence of acute osseous abnormality.  Incidental low back subcutaneous edema. There may be a chronic left posterior  paraspinal lipoma (series 4, image 13), unchanged since 2006.  The bladder is distended. Motion artifact through the upper abdominal viscera. Grossly stable left renal lower pole with post treatment stranding. No retroperitoneal lymphadenopathy.  Visualized lower thoracic spinal cord is normal with conus medularis at T12-L1. No abnormal intradural enhancement. Normal cauda equina.  T11-T12:  Negative.  T12-L1:  Negative.  L1-L2:  Negative.  L2-L3:  Negative.  L3-L4:  Negative.  L4-L5:  Negative except for mild facet hypertrophy.  L5-S1: Negative except for mild facet hypertrophy and epidural lipomatosis. No stenosis.  IMPRESSION: 1. No acute or metastatic process in the lumbar spine. No acute traumatic injury identified. 2. Normal discs. No significant degenerative change. No spinal stenosis or neural impingement.   Electronically Signed   By: LLars PinksM.D.   On: 01/09/2013 13:23   Medications: I have reviewed the patient's current medications. Scheduled Meds: . atorvastatin  20 mg Oral q1800  . docusate sodium  100 mg Oral q morning - 10a  . ferrous gluconate  324 mg Oral BID WC  . fluticasone  2 spray Each Nare Daily  . gabapentin  300 mg Oral BID  . insulin aspart  0-15 Units Subcutaneous TID WC  . insulin detemir  36 Units Subcutaneous BID  . mometasone-formoterol  2 puff Inhalation BID  . morphine  15 mg Oral Q12H  . mupirocin cream  1 application Topical Daily  . nicotine  21 mg Transdermal Daily  . omeprazole  40 mg Oral Daily  . sertraline  100 mg Oral Daily  . tiotropium  18 mcg Inhalation Daily  . warfarin  2.5 mg Oral q1800  . Warfarin - Pharmacist Dosing Inpatient   Does not apply q1800   Continuous Infusions:   PRN Meds:.acetaminophen, albuterol, albuterol, albuterol, ALPRAZolam, HYDROcodone-acetaminophen, ondansetron Assessment/Plan:  Fall/weakness/instability- Resolved.  Pt's hgb dropped and will recheck this AM.   - Repeat Hgb - Lumbar spine MRI and left hip xray are  unremarkable - Continue off MS Contin. Will not restart MS Contin at discharge  - continue with Vicodin prn for pain  - will follow up PCP for options of pain management   Constipation- Chronic. She reports using enemas frequently at home.  Enema yesterday produced a BM. - cont with Miralax   COPD- Stable. PFTs performed in 02/2012: FEV1 0.92 (40%), ratio 69, 27% increase in FEV1 with BD, TLC 91%, w/ severe airtrapping, DLCO 49% On chronic home O2. Continue home regimen.  -Spiriva, albuterol inhaler prn, Dulera  -Continue O2 via [place] @ 2L  -Nicoderm patch  -CPAP QHS  Controlled DM type II- Stable. On Insulin Detemir 36 units bid + Novolog 18 units tid. Also on Lisinopril 10 mg qd. Most  recent HbA1c 6.7.  -Continue Detemir 36 units bid   CHF- Stable.  Most recent ECHO in 08/2012, EF of 55-60%, w/ increased in a pattern of mild LVH and severely LA dilation. No signs of CHF exacerbation at this time.   HLD- Continue Lipitor 20 mg qhs   Anxiety/depression- Continue home meds  Dispo: Anticipated discharge today pending PT.     The patient does have a current PCP Karren Cobble, MD) and does need an [facility] hospital follow-up appointment after discharge.  The patient does not have transportation limitations that hinder transportation to clinic appointments.  .Services Needed at time of discharge: Y = Yes, Blank = No PT:   OT:   RN:   Equipment:   Other:     LOS: 3 days   Michail Jewels, MD 01/10/2013, 4:00 PM Pager: [phone]

## 2013-01-10 NOTE — Evaluation (Signed)
Occupational Therapy Evaluation Patient Details Name: [name] MRN: [id number] DOB: 1950/05/04 Today's Date: 01/10/2013 Time: 8101-7510 OT Time Calculation (min): 14 min  OT Assessment / Plan / Recommendation History of present illness Patient is a 62 yo female admitted following a fall.  Patient with chronic back pain.  MD had increased her pain med dose.  Patient reports pain meds made her feel "woozy" and patient fell hitting her head.  Reports she did not lose consciousness.  Patient with h/o COPD, OSA on CPAP, CHF, PAF, DM, fibromyalgia.   Clinical Impression   Pt is at Mod I - sup level with ADLs and ADL mobility, all education completed and no further acute OT services indicated at this time. OT will sign off         OT Assessment  Patient does not need any further OT services    Follow Up Recommendations  No OT follow up    Barriers to Discharge   none  Equipment Recommendations       Recommendations for Other Services    Frequency       Precautions / Restrictions Precautions Precautions: Fall Restrictions Weight Bearing Restrictions: No   Pertinent Vitals/Pain No c/o pain   ADL  Grooming: Performed;Wash/dry hands;Wash/dry face;Supervision/safety Where Assessed - Grooming: Unsupported standing Upper Body Bathing: Simulated;Modified independent Lower Body Bathing: Simulated;Modified independent Upper Body Dressing: Performed;Modified independent Lower Body Dressing: Performed;Modified independent Toilet Transfer: Performed;Supervision/safety;Modified independent Armed forces technical officer Method: Sit to Loss adjuster, chartered: Regular height toilet Toileting - Clothing Manipulation and Hygiene: Performed;Modified independent;Supervision/safety Where Assessed - Toileting Clothing Manipulation and Hygiene: Standing Tub/Shower Transfer: Modified independent;Supervision/safety Tub/Shower Transfer Method: Therapist, art: Walk in  shower    OT Diagnosis:    OT Problem List:   OT Treatment Interventions:     OT Goals(Current goals can be found in the care plan section) Acute Rehab OT Goals Patient Stated Goal: To be able to return home alone  Visit Information  Last OT Received On: 01/10/13 History of Present Illness: Patient is a 62 yo female admitted following a fall.  Patient with chronic back pain.  MD had increased her pain med dose.  Patient reports pain meds made her feel "woozy" and patient fell hitting her head.  Reports she did not lose consciousness.  Patient with h/o COPD, OSA on CPAP, CHF, PAF, DM, fibromyalgia.       Prior [place] expects to be discharged to:: Private residence Available Help at Discharge: Family;Available PRN/intermittently Type of Home: House Home Access: Stairs to enter [employer] of Steps: 1 Entrance Stairs-Rails: None Home Layout: One level Home Equipment: Shower seat;Bedside commode;Cane - single point;Walker - 2 wheels;Grab bars - toilet;Grab bars - tub/shower Prior Function Level of Independence: Independent with assistive device(s) Comments: Sister drives patient to appts and for errands/groceries. Communication Communication: No difficulties Dominant Hand: Right         Vision/Perception Vision - History Baseline Vision: Wears glasses all the time Patient Visual Report: No change from baseline Perception Perception: Within Functional Limits   Cognition  Cognition Arousal/Alertness: Awake/alert Behavior During Therapy: WFL for tasks assessed/performed Overall Cognitive Status: Within Functional Limits for tasks assessed    Extremity/Trunk Assessment Upper Extremity Assessment Upper Extremity Assessment: Overall WFL for tasks assessed Lower Extremity Assessment Lower Extremity Assessment: Defer to PT evaluation Cervical / Trunk Assessment Cervical / Trunk Assessment: Normal     Mobility Bed  Mobility Bed Mobility: Supine to Sit;Sitting -  Scoot to [employer] of Bed;Sit to Supine;Scooting to [facility] Supine to Sit: 6: Modified independent (Device/Increase time) Sitting - Scoot to Edge of Bed: 6: Modified independent (Device/Increase time) Sit to Supine: 6: Modified independent (Device/Increase time) Scooting to [facility]: 6: Modified independent (Device/Increase time) Transfers Transfers: Sit to Stand;Stand to Sit Sit to Stand: 5: Supervision;From bed;From toilet;Without upper extremity assist Stand to Sit: To bed;To toilet;Without upper extremity assist;5: Supervision     Exercise     Balance Balance Balance Assessed: Yes Static Sitting Balance Static Sitting - Balance Support: No upper extremity supported;Feet supported Static Sitting - Level of Assistance: 7: Independent Dynamic Sitting Balance Dynamic Sitting - Balance Support: No upper extremity supported;Feet unsupported;During functional activity Dynamic Sitting - Level of Assistance: 7: Independent Static Standing Balance Static Standing - Balance Support: No upper extremity supported;During functional activity Static Standing - Level of Assistance: 5: Stand by assistance Dynamic Standing Balance Dynamic Standing - Balance Support: No upper extremity supported;During functional activity Dynamic Standing - Level of Assistance: 5: Stand by assistance   End of Session OT - End of Session Activity Tolerance: Patient tolerated treatment well Patient left: in bed;with call bell/phone within reach  GO Functional Limitation: Self care Self Care Current Status (A0762): 0 percent impaired, limited or restricted Self Care Goal Status (U6333): 0 percent impaired, limited or restricted Self Care Discharge Status (L4562): 0 percent impaired, limited or restricted   Britt Bottom 01/10/2013, 2:40 PM

## 2013-01-10 NOTE — Progress Notes (Signed)
  Date: 01/10/2013  Patient name: [name]  Medical record number: [id number]  Date of birth: Apr 17, 1950   This patient has been seen and the plan of care was discussed with the house staff. Please see their note for complete details. I concur with their findings with the following additions/corrections: [name] told the circumstances of her recent fall to Dr Gordy Levan. She has a tendency to sleep anytime, anywhere, freq at her desk. This occurred on the day of admission. She woke up and walked a few steps to a chair, felt dizzy, sat down and fell asleep. She then woke up and tried to walk and this was when she fell down. We discussed that she really needs a scheduled sleeping pattern instead of sleeping random times at random places.   Home today with home PT  [facility] may be F/U as outpt.   For her chronic pain, etiology unknown, she will stop her MsContin (pt preference) and resume Hydrocodone. Used about 3 daily in hospital.    Bartholomew Crews, MD 01/10/2013, 1:55 PM

## 2013-01-10 NOTE — Progress Notes (Signed)
DC IV, DC Tele, DC Home. Discharge instructions and home medications discussed with patient. Patient denied any questions or concerns at this time. Patient leaving unit via wheelchair and refused O2. Patient appears in no acute distress or discomfort. No verbal complaints.

## 2013-01-10 NOTE — Progress Notes (Signed)
Visit to patient while in hospital. Will call after patient is discharged to assist with transition of care.

## 2013-01-10 NOTE — Progress Notes (Signed)
ANTICOAGULATION CONSULT NOTE - Follow Up Consult  Pharmacy Consult for Coumadin Indication: atrial fibrillation  Allergies  Allergen Reactions  . Lorazepam     Patient's sister noted that ativan caused the patient to become extremely confused during hospitalization 09/2010  . Oxycontin [Oxycodone] Other (See Comments)    headache  . Tramadol Hcl     REACTION: swelling    Patient Measurements: Height: _0  (157.5 cm) Weight: 198 lb 1.6 oz (89.858 kg) (b scale) IBW/kg (Calculated) : 50.1 Heparin Dosing Weight:   Vital Signs: Temp: 97.8 F (36.6 C) (12/01 0633) Temp src: Oral (12/01 0633) BP: 141/59 mmHg (12/01 0633) Pulse Rate: 90 (12/01 0633)  Labs:  Recent Labs  01/08/13 0520 01/09/13 0530 01/10/13 0545 01/10/13 0900  HGB  --  9.1*  --  9.8*  HCT  --  27.8*  --  29.5*  PLT  --  180  --  198  LABPROT 19.4* 22.7* 25.8*  --   INR 1.69* 2.08* 2.45*  --   CREATININE 1.05 0.98  --   --     Estimated Creatinine Clearance: 61.2 ml/min (by C-G formula based on Cr of 0.98).  Assessment: 62 yo female admitted with increasing weakness and dizziness. On chronic Coumadin for afib. INR (2.45) is therapeutic today. Hgb 9.8, plt 198, No bleeding noted per chart.    PTA dose 2.37m daily.  Goal of Therapy:  INR 2-3 Monitor platelets by anticoagulation protocol: Yes   Plan:  Coumadin 2.5 mg daily as ordered. F/u INR  MMaryanna Shape PharmD, BCPS  Clinical Pharmacist  Pager: [phone]  01/10/2013,10:38 AM

## 2013-01-10 NOTE — Discharge Summary (Signed)
Name: [name] MRN: [id number] DOB: 1950-02-28 62 y.o. PCP: Karren Cobble, MD  Date of Admission: 01/07/2013  8:27 AM Date of Discharge: 01/10/2013 Attending Physician: Dominic Pea, DO  Discharge Diagnosis: Principal Problem:   Abnormal gait Active Problems:   Type 2 diabetes mellitus with diabetic neuropathy   Obstructive sleep apnea   Fibromyalgia   Chronic congestive heart failure with left ventricular diastolic dysfunction   Paroxysmal atrial fibrillation   Tobacco abuse   Long term (current) use of anticoagulants   Chronic low back pain   Dizziness   Elevated serum creatinine   Hypotension, unspecified   Physical deconditioning   Cough  Discharge Medications:   Medication List    STOP taking these medications       furosemide 80 MG tablet  Commonly known as:  LASIX     lisinopril 10 MG tablet  Commonly known as:  PRINIVIL,ZESTRIL     morphine 30 MG 12 hr tablet  Commonly known as:  MS CONTIN      TAKE these medications       albuterol 108 (90 BASE) MCG/ACT inhaler  Commonly known as:  PROVENTIL HFA;VENTOLIN HFA  Inhale 2 puffs into the lungs every 6 (six) hours as needed for shortness of breath.     alendronate 70 MG tablet  Commonly known as:  FOSAMAX  Take 70 mg by mouth every 7 (seven) days. Takes on wednesdays     ALPRAZolam 1 MG tablet  Commonly known as:  XANAX  Take 0.5 mg by mouth daily as needed. 0.5 mg by mouth up to twice daily as needed for anxiety. AND 81m at bedtime for sleep     Calcium Carb-Cholecalciferol 600-800 MG-UNIT Tabs  Take 2 tablets by mouth 2 (two) times daily.     chlorpheniramine 4 MG tablet  Commonly known as:  CHLOR-TRIMETON  Take 1 tablet (4 mg total) by mouth 3 (three) times daily as needed for allergies.     docusate sodium 50 MG capsule  Commonly known as:  COLACE  Take 100 mg by mouth every morning. Can Alternate with  senokot     ferrous gluconate 324 MG tablet  Commonly known as:  FERGON    Take 1 tablet (324 mg total) by mouth 2 (two) times daily with a meal.     fluticasone 50 MCG/ACT nasal spray  Commonly known as:  FLONASE  Place 2 sprays into the nose daily.     Fluticasone-Salmeterol 250-50 MCG/DOSE Aepb  Commonly known as:  ADVAIR  Inhale 1 puff into the lungs every 12 (twelve) hours.     gabapentin 300 MG capsule  Commonly known as:  NEURONTIN  Take 300 mg by mouth 2 (two) times daily.     HYDROcodone-acetaminophen 5-325 MG per tablet  Commonly known as:  NORCO/VICODIN  Take 1-2 tablets by mouth every 6 (six) hours as needed for moderate pain or severe pain.     insulin aspart 100 UNIT/ML injection  Commonly known as:  novoLOG  Inject 18 Units into the skin 3 (three) times daily with meals. Take 18 units with meals and also use SSI with meals in addition to the 18 units of insulin     Insulin Detemir 100 UNIT/ML Sopn  Inject 36 Units into the skin 2 (two) times daily. Inject 0.36 mLs (36 Units total) into the skin 2 (two) times daily     mupirocin cream 2 %  Commonly known as:  [employer]  1 application topically daily.     nystatin cream  Commonly known as:  MYCOSTATIN  Apply 1 application topically 2 (two) times daily.     omeprazole 40 MG capsule  Commonly known as:  PRILOSEC  Take 1 capsule (40 mg total) by mouth daily.     petrolatum-hydrophilic-aloe vera ointment  Apply 1 application topically 2 (two) times daily as needed for wound care.     potassium chloride SA 20 MEQ tablet  Commonly known as:  K-DUR,KLOR-CON  Take 20 mEq by mouth 2 (two) times daily.     rosuvastatin 20 MG tablet  Commonly known as:  CRESTOR  Take 20 mg by mouth daily with supper.     sertraline 100 MG tablet  Commonly known as:  ZOLOFT  Take 1 tablet (100 mg total) by mouth daily.     tiotropium 18 MCG inhalation capsule  Commonly known as:  SPIRIVA  Place 18 mcg into inhaler and inhale daily.     warfarin 5 MG tablet  Commonly known as:  COUMADIN   Take 2.5 mg by mouth daily.        Disposition and follow-up:   [name] was discharged from [facility] in Stable condition.  At the hospital follow up visit please address:  1.  Pain management; frequent falls; sleep hygiene   2.  Labs / imaging needed at time of follow-up: Hgb (pt had a drop in her hgb during her hospitalization)  3.  Pending labs/ test needing follow-up: None  Follow-up Appointments: Follow-up Information   Follow up with Karren Cobble, MD On 01/14/2013. (10:45am)    Specialty:  Internal Medicine   Contact information:   [address] [place] [address] [phone]       Discharge Instructions: Discharge Orders   Future Appointments Provider Department Dept Phone   01/12/2013 9:45 AM [facility] [place] Office [phone]   01/14/2013 10:45 AM Karren Cobble, MD [place] [phone]   04/11/2013 9:30 AM Kathee Delton, MD [facility] [phone]   09/13/2013 1:00 PM [facility] [address] [phone]   09/13/2013 2:00 PM Sharmon Leyden Nickel, NP [facility] [phone]   Future Orders Complete By Expires   Call MD for:  difficulty breathing, headache or visual disturbances  As directed    Call MD for:  extreme fatigue  As directed    Diet - low sodium heart healthy  As directed    Increase activity slowly  As directed       Consultations:  None  Procedures Performed:  Dg Hip Complete Left  01/09/2013   CLINICAL DATA:  Pain in left hip.  No known injury.  EXAM: LEFT HIP - COMPLETE 2+ VIEW  COMPARISON:  None.  FINDINGS: There is no evidence of hip fracture or dislocation. Left hip is located. Slight joint space narrowing of both hips. Negative for osteophyte formation. Sacroiliac joints and pubic symphysis appear normal  IMPRESSION: No acute bony abnormality.   Electronically Signed   By:  Curlene Dolphin M.D.   On: 01/09/2013 11:37   Ct Head Wo Contrast  01/07/2013   CLINICAL DATA:  Dizziness.  Coumadin therapy.  EXAM: CT HEAD WITHOUT CONTRAST  TECHNIQUE: Contiguous axial images were obtained from the base of the skull through the vertex without intravenous contrast.  COMPARISON:  12/12/2011.  FINDINGS: No mass. No hydrocephalus. No hemorrhage.  No acute bony abnormality. Paranasal sinuses mastoids are clear.  IMPRESSION: No acute abnormality.   Electronically Signed   By: Marcello Moores  Register   On: 01/07/2013 10:02   Mr Lumbar Spine W Wo Contrast  01/09/2013   CLINICAL DATA:  62 year old female status post fall with severe back pain. Initial encounter. History of left renal carcinoma cryoablation and 2013.  EXAM: MRI LUMBAR SPINE WITHOUT AND WITH CONTRAST  TECHNIQUE: Multiplanar and multiecho pulse sequences of the lumbar spine were obtained without and with intravenous contrast.  CONTRAST:  61m MULTIHANCE GADOBENATE DIMEGLUMINE 529 MG/ML IV SOLN  COMPARISON:  Lumbar radiographs 11/01/2012. CT Abdomen and Pelvis 08/25/2012. Lumbar MRI 10/23/2004.  FINDINGS: Normal lumbar segmentation depicted in September. Stable and normal vertebral height and alignment since 2006. Visible bone marrow signal is stable and normal. No marrow edema or evidence of acute osseous abnormality.  Incidental low back subcutaneous edema. There may be a chronic left posterior paraspinal lipoma (series 4, image 13), unchanged since 2006.  The bladder is distended. Motion artifact through the upper abdominal viscera. Grossly stable left renal lower pole with post treatment stranding. No retroperitoneal lymphadenopathy.  Visualized lower thoracic spinal cord is normal with conus medularis at T12-L1. No abnormal intradural enhancement. Normal cauda equina.  T11-T12:  Negative.  T12-L1:  Negative.  L1-L2:  Negative.  L2-L3:  Negative.  L3-L4:  Negative.  L4-L5:  Negative except for mild facet hypertrophy.  L5-S1: Negative except  for mild facet hypertrophy and epidural lipomatosis. No stenosis.  IMPRESSION: 1. No acute or metastatic process in the lumbar spine. No acute traumatic injury identified. 2. Normal discs. No significant degenerative change. No spinal stenosis or neural impingement.   Electronically Signed   By: LLars PinksM.D.   On: 01/09/2013 13:23   Dg Chest Port 1 View  01/07/2013   CLINICAL DATA:  Fall this morning.  Weakness and history of COPD.  EXAM: PORTABLE CHEST - 1 VIEW  COMPARISON:  Multiple priors  FINDINGS: The cardiomediastinal silhouette is likely unchanged given patient rotation. Prominence of the interstitial markings, with a chronic component. Superimposed pulmonary vascular congestion is not excluded. Mild bibasilar opacities, left greater than right, likely atelectasis. No pleural effusion or pneumothorax. No acute osseous abnormality.  IMPRESSION: 1. Prominence of the interstitial markings. A component of this is chronic. Superimposed pulmonary vascular congestion is not excluded.  2. Bibasilar opacities, left greater than right, likely atelectasis.   Electronically Signed   By: MDonavan BurnetM.D.   On: 01/07/2013 09:29    2D Echo: None  Cardiac Cath: Nonw  Admission HPI: Ms. GRENEKA NEBERGALLis a 62y.o. female w/ PMHx of severe COPD (Gold's III), pulmonary HTN, paroxysmal A-fib (s/p maze procedure), CHF, mitral stenosis, CA stenosis, OSA, DM type II, HLD, h/o RCCa, GERD, anxiety, and depression, presents to the ED w/ complaints of a recent fall overnight. The patient claims she got up at night to use the bathroom, took one step, stumbled and fell. Prior to falling, patient felt quite "dizzy" and fell to the ground, hitting her head on the ground. Given her use of Coumadin, the patient was worried that she could bleed and came to the ED. The patient notes that she recently had an increase in her dose of MS Contin from 15 mg bid to 30 mg bid. She also claims that her BP has been low lately  90's-100's/40's-50's. The patient denies any recent nausea or vomiting, diarrhea, increased urinary frequency or dysuria. She also  denies decreased po intake lately. Also claims to have a headache.  Patient otherwise denies chest pain, SOB (uses 1L O2 continuously at home), abdominal pain and LE swelling. No recent fever or chills. No seizures or LOC.   Hospital Course by problem list: Principal Problem:   Abnormal gait Active Problems:   Type 2 diabetes mellitus with diabetic neuropathy   Obstructive sleep apnea   Fibromyalgia   Chronic congestive heart failure with left ventricular diastolic dysfunction   Paroxysmal atrial fibrillation   Tobacco abuse   Long term (current) use of anticoagulants   Chronic low back pain   Dizziness   Elevated serum creatinine   Hypotension, unspecified   Physical deconditioning   Cough   Mechanical Fall with associated physical deconditioning- Patient w/ recent mechanical fall after standing from sleeping. She complained of dizziness/lightheadedness prior to falling. The admitting team felt issues w/ hypotension and a recent increase in MS Contin dose from 15 mg bid to 30 mg bid was a possible etiology for the fall. Also reported h/o head trauma on fall. CT head in ED showed no acute abnormalities. Patient denied any palpitations, given history of episode, unlikely to be cardiogenic. Denied any loss of consciousness.  No history of seizures.  She was given 1L bolus NS in the ED and admitted to IMTS and placed on telemetry.  MS Contin was discontinued per patient request.  Additionally, she complained of left hip and lower back pain.  An MRI of the lumbar spine showed no acute or metastatic process in the lumbar spine. Revealed normal discs without significant degenerative changes, spinal stenosis, or  impingement.  XR of the left hip showed no acute bony abnormality.  She was started on Norco every 6 hours as needed for pain, however, she reports that her pain  is not well controlled.  We emphasized the goals of her pain management were to increase her function and she voiced understanding.  She reports using the computer a lot and sometimes falls asleep on the desk when she is at her computer.  We also emphasized that she needs to have better sleep hygiene which may also be exacerbating her multiple medical conditions.    COPD Stage III- Stable.  PFTs performed in 02/2012: FEV1 0.92 (40%), ratio 69, 27% increase in FEV1 with BD, TLC 91%, w/ severe airtrapping, DLCO 49% On chronic home O2 _0 .   She was given albuterol nebulizer in ED.  We continued spiriva, albuterol inhaler PRN and dulera.  We continued O2@ 2L via [place].   Controlled DM type II- Stable. On Insulin Detemir 36 units bid + Novolog 18 units tid. Also on Lisinopril 10 mg qd. Most recent HA1C 6.7.   Diastolic Heart Failure- Stable. Most recent ECHO in 08/2012, EF of 55-60%, w/ increased in a pattern of mild LVH and severely LA dilation.   HLD- Continued Lipitor 20 mg qhs.  Anxiety/depression- Stable. Continued home meds.  Fibromyalgia- Chronic.    Discharge Vitals:   BP 114/42  Pulse 67  Temp(Src) 97.8 F (36.6 C) (Oral)  Resp 18  Ht _1  (1.575 m)  Wt 89.858 kg (198 lb 1.6 oz)  BMI 36.22 kg/m2  SpO2 92%  Discharge Labs:  Results for orders placed during the hospital encounter of 01/07/13 (from the past 24 hour(s))  GLUCOSE, CAPILLARY     Status: Abnormal   Collection Time    01/09/13 12:29 PM      Result Value Range   Glucose-Capillary 129 (*)  70 - 99 mg/dL  GLUCOSE, CAPILLARY     Status: Abnormal   Collection Time    01/09/13  4:14 PM      Result Value Range   Glucose-Capillary 119 (*) 70 - 99 mg/dL  PROTIME-INR     Status: Abnormal   Collection Time    01/10/13  5:45 AM      Result Value Range   Prothrombin Time 25.8 (*) 11.6 - 15.2 seconds   INR 2.45 (*) 0.00 - 1.49  CBC     Status: Abnormal   Collection Time    01/10/13  9:00 AM      Result Value Range   WBC 7.9   4.0 - 10.5 K/uL   RBC 3.10 (*) 3.87 - 5.11 MIL/uL   Hemoglobin 9.8 (*) 12.0 - 15.0 g/dL   HCT 29.5 (*) 36.0 - 46.0 %   MCV 95.2  78.0 - 100.0 fL   MCH 31.6  26.0 - 34.0 pg   MCHC 33.2  30.0 - 36.0 g/dL   RDW 15.6 (*) 11.5 - 15.5 %   Platelets 198  150 - 400 K/uL    Signed: Michail Jewels, MD 01/10/2013, 11:03 AM   Time Spent on Discharge: 40 minutes Services Ordered on Discharge: PT Equipment Ordered on Discharge: None

## 2013-01-10 NOTE — Care Management Note (Signed)
    Page 1 of 2   01/10/2013     2:02:26 PM   CARE MANAGEMENT NOTE 01/10/2013  Patient:  IYANLA, EILERS A   Account Number:  [id number]  Date Initiated:  01/10/2013  Documentation initiated by:  [facility]  Subjective/Objective Assessment:   62 yr old female with hx of GOLD stage 3 COPD, P-Afib on coumadin anticoagulation, HL, Type 2 DM w/ diabetic neuropathy, anxiety/depression, chronic lower back pain, presented due to fall./ hm alone     Action/Plan:   -Admitted to telemetry  -Given 1L bolus NS in ED.  -Perform orthostatics to determine if orthostatic HoTN.  -Will start NS @ 75 ml/hr for 12 hours. Cr. 1.42, elevated from baseline of 1.2.//Home with [facility]   Anticipated DC Date:  01/10/2013   Anticipated DC Plan:  [place]  CM consult      [facility] Choice  HOME HEALTH   Choice offered to / List presented to:  C-1 Patient        [place] arranged  [place] PT      [place].   Status of service:  Completed, signed off Medicare Important Message given?   (If response is "NO", the following Medicare IM given date fields will be blank) Date Medicare IM given:   Date Additional Medicare IM given:    Discharge Disposition:    Per UR Regulation:    If discussed at Long Length of Stay Meetings, dates discussed:    Comments:  01/10/13 [place], RN, BSN, [employer] [phone] CM spoke with patient concerning discharge planning. Pt offered choice for [facility] for [facility] services upon discharge. Per pt choice [facility] to provide HHPT services.  [facility] rep Lelan Pons contacted concerning new referral. Pt to discharge home alone. No DME needs identified at this time.

## 2013-01-11 ENCOUNTER — Telehealth: Payer: Self-pay | Admitting: Dietician

## 2013-01-11 ENCOUNTER — Telehealth: Payer: Self-pay | Admitting: Cardiology

## 2013-01-11 ENCOUNTER — Telehealth: Payer: Self-pay | Admitting: Pharmacist

## 2013-01-11 DIAGNOSIS — R0989 Other specified symptoms and signs involving the circulatory and respiratory systems: Secondary | ICD-10-CM

## 2013-01-11 DIAGNOSIS — R0609 Other forms of dyspnea: Secondary | ICD-10-CM

## 2013-01-11 NOTE — Telephone Encounter (Signed)
Resume lasix 80 BID and KCL 20 meq BID; bmet one week; fu with me in 4 weeks; stay off lisinopril Kirk [place]s

## 2013-01-11 NOTE — Telephone Encounter (Addendum)
Discharge date:01-10-13 Call date: 01-11-13 Hospital follow up appointment date: Friday at 10:45 AM  Calling to assist with transition of care from hospital to home.  Discharge medications reviewed:no Able to fill all prescriptions? yes Patient aware of hospital follow up appointments? yes  No problems with transportation.no Other problems/concerns: Patient reports that she had a hard time breathing last night after smoking a cigarette, short of breathe all night, put a nicotene patch on today and breathing is better.  She also reports a low blood sugar today: her blood sugar was 107 this am, she  took 18 units novolog and her long acting insulin, ate breakfast, had a bad headache, took a nap, awoke to being soaking wet, ate piece of candy, sugar was 71. She wants to know if she should still take 18 units when her blood sugar is less than 100 mg/dl?  Note order is for 18 units and "SSI" with meals. Suggest patient meal time insulin be decreased to 15 units in light of recent falls and A1C of 6.7% and correction scale have correction for lower blood sugars.

## 2013-01-11 NOTE — Telephone Encounter (Signed)
After getting approval from Dr. Daryll Drown, called patient and told her to decrease her meal time insulin to 15 units if blood sugar and decrease the 15 by 2 units if < 145m/dl.( if < 100 mg/dl take 13 units. ) patient was asked to check blood sugar 2 hours post prandial and more frequently for next few days and call if any other problems or questions.She had previously asked for her SSI to be emailed to her:  Suggested her correction scale be reevaluated and discussed with her on Friday

## 2013-01-11 NOTE — Telephone Encounter (Signed)
New Problem:  Pt has questions about her coumadin dosage and questions about her appt. Pt states she just got out of the hospital and isn't sure if her coumadin needs to be checked so soon after getting out of the hospital.

## 2013-01-11 NOTE — Telephone Encounter (Signed)
Called spoke with pt discharged from [facility] 01/10/13 INR 2.4.  Pt has INR scheduled for 01/12/13 changed appt out 1 week.  Pt was d/c on home regimen of Coumadin.  Made f/u visit 01/19/13 at 9:30am. Pt aware.

## 2013-01-11 NOTE — Telephone Encounter (Signed)
New Problem:  Pt is wanting a nurse to call her back. Pt states she will give more details when a nurse calls.

## 2013-01-11 NOTE — Telephone Encounter (Signed)
Returned call to patient she stated she was discharged from hospital 01/10/13.Stated she was admitted for a fall.Stated she was concerned because Lisinopril and Lasix were stopped.Stated she wanted Dr.Crenshaw to know.Message sent to [facility] for his review.

## 2013-01-12 MED ORDER — FUROSEMIDE 80 MG PO TABS: 80.0000 mg | ORAL_TABLET | Freq: Two times a day (BID) | ORAL | Status: DC

## 2013-01-12 NOTE — Telephone Encounter (Signed)
Spoke with pt, Aware of dr crenshaw's recommendations.  °

## 2013-01-13 ENCOUNTER — Telehealth: Payer: Self-pay | Admitting: *Deleted

## 2013-01-13 NOTE — Telephone Encounter (Signed)
Call from [place] with [facility] -  She is asking for an order to be faxed to [facility] @ [phone] for Skilled Nursing.  They have order for PT but nursing was not ordered. Pt needs help with medications.  She several co morbidities.

## 2013-01-13 NOTE — Telephone Encounter (Signed)
Edd Fabian, can you send a request to the inpatient team that was taking care of her? I went off service the day prior to discharge. It looks like Dr. Gordy Levan and Dr. Alice Rieger made the final discharge plan/recs along with Dr. Lynnae January. I have cc'd them on this.

## 2013-01-13 NOTE — Telephone Encounter (Signed)
Pt has a scheduled appointment tomorrow with Dr Eppie Gibson.  He will make the decision at that time. Pt called and informed.  She states she does not feel like she needs either service.

## 2013-01-14 ENCOUNTER — Encounter: Payer: Self-pay | Admitting: Internal Medicine

## 2013-01-14 ENCOUNTER — Ambulatory Visit (INDEPENDENT_AMBULATORY_CARE_PROVIDER_SITE_OTHER): Payer: Medicare Other | Admitting: Internal Medicine

## 2013-01-14 VITALS — BP 135/60 | HR 70 | Temp 97.6°F | Wt 189.4 lb

## 2013-01-14 DIAGNOSIS — G8929 Other chronic pain: Secondary | ICD-10-CM

## 2013-01-14 DIAGNOSIS — M545 Low back pain, unspecified: Secondary | ICD-10-CM

## 2013-01-14 DIAGNOSIS — E1142 Type 2 diabetes mellitus with diabetic polyneuropathy: Secondary | ICD-10-CM

## 2013-01-14 DIAGNOSIS — R252 Cramp and spasm: Secondary | ICD-10-CM | POA: Insufficient documentation

## 2013-01-14 DIAGNOSIS — Z5189 Encounter for other specified aftercare: Secondary | ICD-10-CM

## 2013-01-14 DIAGNOSIS — R110 Nausea: Secondary | ICD-10-CM

## 2013-01-14 DIAGNOSIS — Z720 Tobacco use: Secondary | ICD-10-CM

## 2013-01-14 DIAGNOSIS — R296 Repeated falls: Secondary | ICD-10-CM

## 2013-01-14 DIAGNOSIS — Z9181 History of falling: Secondary | ICD-10-CM

## 2013-01-14 DIAGNOSIS — E1140 Type 2 diabetes mellitus with diabetic neuropathy, unspecified: Secondary | ICD-10-CM

## 2013-01-14 DIAGNOSIS — D638 Anemia in other chronic diseases classified elsewhere: Secondary | ICD-10-CM

## 2013-01-14 DIAGNOSIS — R519 Headache, unspecified: Secondary | ICD-10-CM | POA: Insufficient documentation

## 2013-01-14 DIAGNOSIS — S71002D Unspecified open wound, left hip, subsequent encounter: Secondary | ICD-10-CM

## 2013-01-14 DIAGNOSIS — G589 Mononeuropathy, unspecified: Secondary | ICD-10-CM

## 2013-01-14 DIAGNOSIS — F17200 Nicotine dependence, unspecified, uncomplicated: Secondary | ICD-10-CM

## 2013-01-14 DIAGNOSIS — E1149 Type 2 diabetes mellitus with other diabetic neurological complication: Secondary | ICD-10-CM

## 2013-01-14 DIAGNOSIS — R51 Headache: Secondary | ICD-10-CM

## 2013-01-14 DIAGNOSIS — E785 Hyperlipidemia, unspecified: Secondary | ICD-10-CM

## 2013-01-14 LAB — IRON AND TIBC
%SAT: 9 % — ABNORMAL LOW (ref 20–55)
Iron: 34 ug/dL — ABNORMAL LOW (ref 42–145)
TIBC: 391 ug/dL (ref 250–470)
UIBC: 357 ug/dL (ref 125–400)

## 2013-01-14 LAB — LIPID PANEL
Cholesterol: 103 mg/dL (ref 0–200)
HDL: 29 mg/dL — ABNORMAL LOW (ref >39)
LDL Cholesterol: 37 mg/dL (ref 0–99)
Total CHOL/HDL Ratio: 3.6 Ratio
Triglycerides: 183 mg/dL — ABNORMAL HIGH (ref <150)
VLDL: 37 mg/dL (ref 0–40)

## 2013-01-14 LAB — BASIC METABOLIC PANEL WITH GFR
BUN: 15 mg/dL (ref 6–23)
CO2: 26 mEq/L (ref 19–32)
Calcium: 9.3 mg/dL (ref 8.4–10.5)
Chloride: 108 mEq/L (ref 96–112)
Creat: 0.77 mg/dL (ref 0.50–1.10)
GFR, Est African American: >89 mL/min
GFR, Est Non African American: 82 mL/min
Glucose, Bld: 79 mg/dL (ref 70–99)
Potassium: 4.1 mEq/L (ref 3.5–5.3)
Sodium: 142 mEq/L (ref 135–145)

## 2013-01-14 LAB — VITAMIN B12: Vitamin B-12: 480 pg/mL (ref 211–911)

## 2013-01-14 LAB — PHOSPHORUS: Phosphorus: 3.1 mg/dL (ref 2.3–4.6)

## 2013-01-14 LAB — FERRITIN: Ferritin: 25 ng/mL (ref 10–291)

## 2013-01-14 LAB — CK: Total CK: 54 U/L (ref 7–177)

## 2013-01-14 LAB — MAGNESIUM: Magnesium: 1.9 mg/dL (ref 1.5–2.5)

## 2013-01-14 MED ORDER — INSULIN ASPART 100 UNIT/ML ~~LOC~~ SOLN: 15.0000 [IU] | Freq: Three times a day (TID) | SUBCUTANEOUS | Status: DC

## 2013-01-14 MED ORDER — PROMETHAZINE HCL 12.5 MG PO TABS: 12.5000 mg | ORAL_TABLET | Freq: Four times a day (QID) | ORAL | Status: DC | PRN

## 2013-01-14 NOTE — Assessment & Plan Note (Signed)
She developed muscle cramps last night.  Initially this was thought to be secondary to the fluid shifts she has experienced with the reinstitution of the lasix, which was restarted after discharge.  Her BMP, Mg, and PO4 were checked today and found to be within normal ranges.  Thus, the cause of her nocturnal cramps yesterday remains unclear.  She also notes chronic myalgias but was found to have a normal CK. Alendronate can cause musculoskeletal pain.  We will therefore consider holding the alendronate at the follow-up visit if the muscle cramps or myalgias persist.

## 2013-01-14 NOTE — Assessment & Plan Note (Signed)
She notes some nausea since discharge, which has made it difficult to keep her medications down or eat sufficiently.  She responded to phenergan in the past so we will try it again at 12.5 mg by mouth every 8 hours as needed for nausea.  If her nausea persists on the phenergan over the weekend we will consider zofran.

## 2013-01-14 NOTE — Patient Instructions (Signed)
It was great to see you.  I am sorry you are feeling poorly.  1) We will continue to hold the lisinopril until you start feeling better.  2) Take all of your other medications as prescribed.  3) I prescribed phenergan 12.5 mg by mouth up to three times a day for nausea.  4) Decrease the novolog dose to 15 units as you have.  5) I will call you with any lab results that require [place] to make any changes.  I will see you back in 1-2 months for follow-up/check up.

## 2013-01-14 NOTE — Assessment & Plan Note (Signed)
She did not tolerate the MS Contin secondary to the "drugged feeling" she experienced and she feels this is the cause of her recent fall.  At this point, all we have left to treat her chronic back pain is the Vicodin as needed.  I am hesitant to try an alternative long acting narcotic.  She is aware of our limited options for pain control at this point.  If the pain persists or worsens we will consider referral to a chronic pain specialist for any other ideas.

## 2013-01-14 NOTE — Assessment & Plan Note (Signed)
She states that she quit on 01/11/2013 and has not smoked since.  She was praised for taking this step and encouraged to remain off of tobacco given all of her medical illnesses.  She is on a nicotine patch.  We will reassess her success with smoking cessation at the follow-up visit.

## 2013-01-14 NOTE — Progress Notes (Signed)
   Subjective:    Patient ID: [name], female    DOB: [birth year], 62 y.o.   MRN: [id number]  HPI  Please see the A&P for the status of the pt's chronic medical problems.  Review of Systems  Constitutional: Positive for fatigue.  Respiratory: Positive for shortness of breath and wheezing.   Cardiovascular: Positive for leg swelling. Negative for chest pain and palpitations.  Gastrointestinal: Positive for nausea. Negative for vomiting, abdominal pain and abdominal distention.  Musculoskeletal: Positive for arthralgias, back pain, gait problem and myalgias. Negative for joint swelling.  Skin: Positive for wound.  Neurological: Positive for weakness, light-headedness and headaches. Negative for syncope.      Objective:   Physical Exam  Nursing note and vitals reviewed. Constitutional: She is oriented to person, place, and time. She appears well-developed and well-nourished. No distress.  HENT:  Head: Normocephalic and atraumatic.  Eyes: Conjunctivae are normal. Right eye exhibits no discharge. Left eye exhibits no discharge. No scleral icterus.  Neurological: She is alert and oriented to person, place, and time.  Skin: Skin is warm and dry. No rash noted. She is not diaphoretic. No erythema.  Left flank lesion nearly completely healed.  Psychiatric: She has a normal mood and affect. Her behavior is normal. Judgment and thought content normal.      Assessment & Plan:   Please see problem oriented charting.

## 2013-01-14 NOTE — Assessment & Plan Note (Signed)
Her left flank wound has healed well.  No further intervention or work-up necessary at this time.

## 2013-01-14 NOTE — Telephone Encounter (Signed)
Dr Eppie Gibson has declined order for skilled nursing, [facility] called and advised pt will not need Skilled Nursing Care at this time.

## 2013-01-14 NOTE — Assessment & Plan Note (Signed)
Her lipids were checked today and she was found to have an LDL of 37 on rosuvastatin 20 mg daily.  As this is at target we will continue at this dose.  We also checked a total CK given her muscle cramps and this was found to be normal (not elevated) making rhabdomyolysis from a statin very unlikely.

## 2013-01-14 NOTE — Assessment & Plan Note (Signed)
She had an episode of hypoglycemia while on 18 units of novolog with meals.  This was decreased to 15 units and she has had no further symptomatic hypoglycemic events.  We will continue the novolog at this level.  She knows to call the clinic if she experiences any hypoglycemic events with this lower dose.

## 2013-01-14 NOTE — Assessment & Plan Note (Signed)
She has recently had a slight worsening in her chronic anemia.  Re-evaluation today reveals a normal B12 level, ferritin, and an iron panel consistent with either very early iron deficiency or anemia of chronic disease.  She has had recent endoscopy so a lower GI bleed is unlikely.  She remains on a PPI and I doubt we are dealing with an upper GI bleed.  She has had no other overt bleeding other than lab draws in the hospital.  I suspect we are dealing with an anemia of chronic disease and will continue to follow with occasional CBCs.

## 2013-01-14 NOTE — Assessment & Plan Note (Signed)
She has had headaches since her recent fall which resulted in her hitting her head.  A CT scan was negative in the ED.  I suspect this pain is related to the head trauma and the inability to take her pain medication secondary to the nausea.  It is hoped that with the phenergan her nausea will be improved and she will be better able to tolerate her Vicodin for her headaches.

## 2013-01-14 NOTE — Assessment & Plan Note (Signed)
She was recently admitted for a fall.  She attributes this fall to overmedication from the MS Contin.  This has since been stopped.  Home PT was recommended at discharge but [name] is refusing this service at this time.  We will not treat with MS Contin.  We are also holding the lisinopril because of the normal blood pressure today and the recent falls.  We will reassess her steadiness at the next visit and if stable or with a slightly elevated blood pressure we will restart the lisinopril.

## 2013-01-18 ENCOUNTER — Telehealth: Payer: Self-pay | Admitting: Cardiology

## 2013-01-18 NOTE — Telephone Encounter (Signed)
Spoke with pt, pt had bmp checked with her PCP today. appt for labs tomorrow canceled.

## 2013-01-18 NOTE — Progress Notes (Signed)
Total cholesterol 103 Triglycerides 183 HDL 29 LDL 37  LDL at target on Crestor 20 mg daily.  Will continue this therapy.  BMP unremarkable: K 4.1, Mg 1.9, PO4 3.1, eGFR 82  No electrolyte abnormalities to explain recent nocturnal cramps.  Ferritin 25 Fe 34 Fe sat 9% TIBC 391 Vitamin B12 480  Possible early iron deficiency, but most likely anemia of chronic disease.

## 2013-01-18 NOTE — Telephone Encounter (Signed)
New problem    Pt did lab work @ PCP Dr Lara Mulch office 12/5 , pt want's to know if she still needs to do lab's 12/10?? She would like a call back.

## 2013-01-19 ENCOUNTER — Other Ambulatory Visit: Payer: Medicare Other

## 2013-01-19 ENCOUNTER — Ambulatory Visit (INDEPENDENT_AMBULATORY_CARE_PROVIDER_SITE_OTHER): Payer: Medicare Other | Admitting: *Deleted

## 2013-01-19 ENCOUNTER — Other Ambulatory Visit: Payer: Self-pay | Admitting: Dietician

## 2013-01-19 DIAGNOSIS — D7389 Other diseases of spleen: Secondary | ICD-10-CM

## 2013-01-19 DIAGNOSIS — I4891 Unspecified atrial fibrillation: Secondary | ICD-10-CM

## 2013-01-19 DIAGNOSIS — E1140 Type 2 diabetes mellitus with diabetic neuropathy, unspecified: Secondary | ICD-10-CM

## 2013-01-19 DIAGNOSIS — I480 Paroxysmal atrial fibrillation: Secondary | ICD-10-CM

## 2013-01-19 DIAGNOSIS — Z7901 Long term (current) use of anticoagulants: Secondary | ICD-10-CM

## 2013-01-19 DIAGNOSIS — D735 Infarction of spleen: Secondary | ICD-10-CM

## 2013-01-19 LAB — POCT INR: INR: 2

## 2013-01-19 NOTE — Telephone Encounter (Signed)
[facility] is requesting a new Accu chek Fastclick Lancet device.

## 2013-01-20 MED ORDER — ACCU-CHEK FASTCLIX LANCET KIT: PACK | Status: DC

## 2013-01-21 IMAGING — CR DG CHEST 1V PORT
1 series · 1 of 1 positions shown · non-contrast
Comparison: 09/12/2010

CLINICAL DATA: Short of breath.  Pulmonary edema.

PORTABLE CHEST - 1 VIEW

[view not recorded]
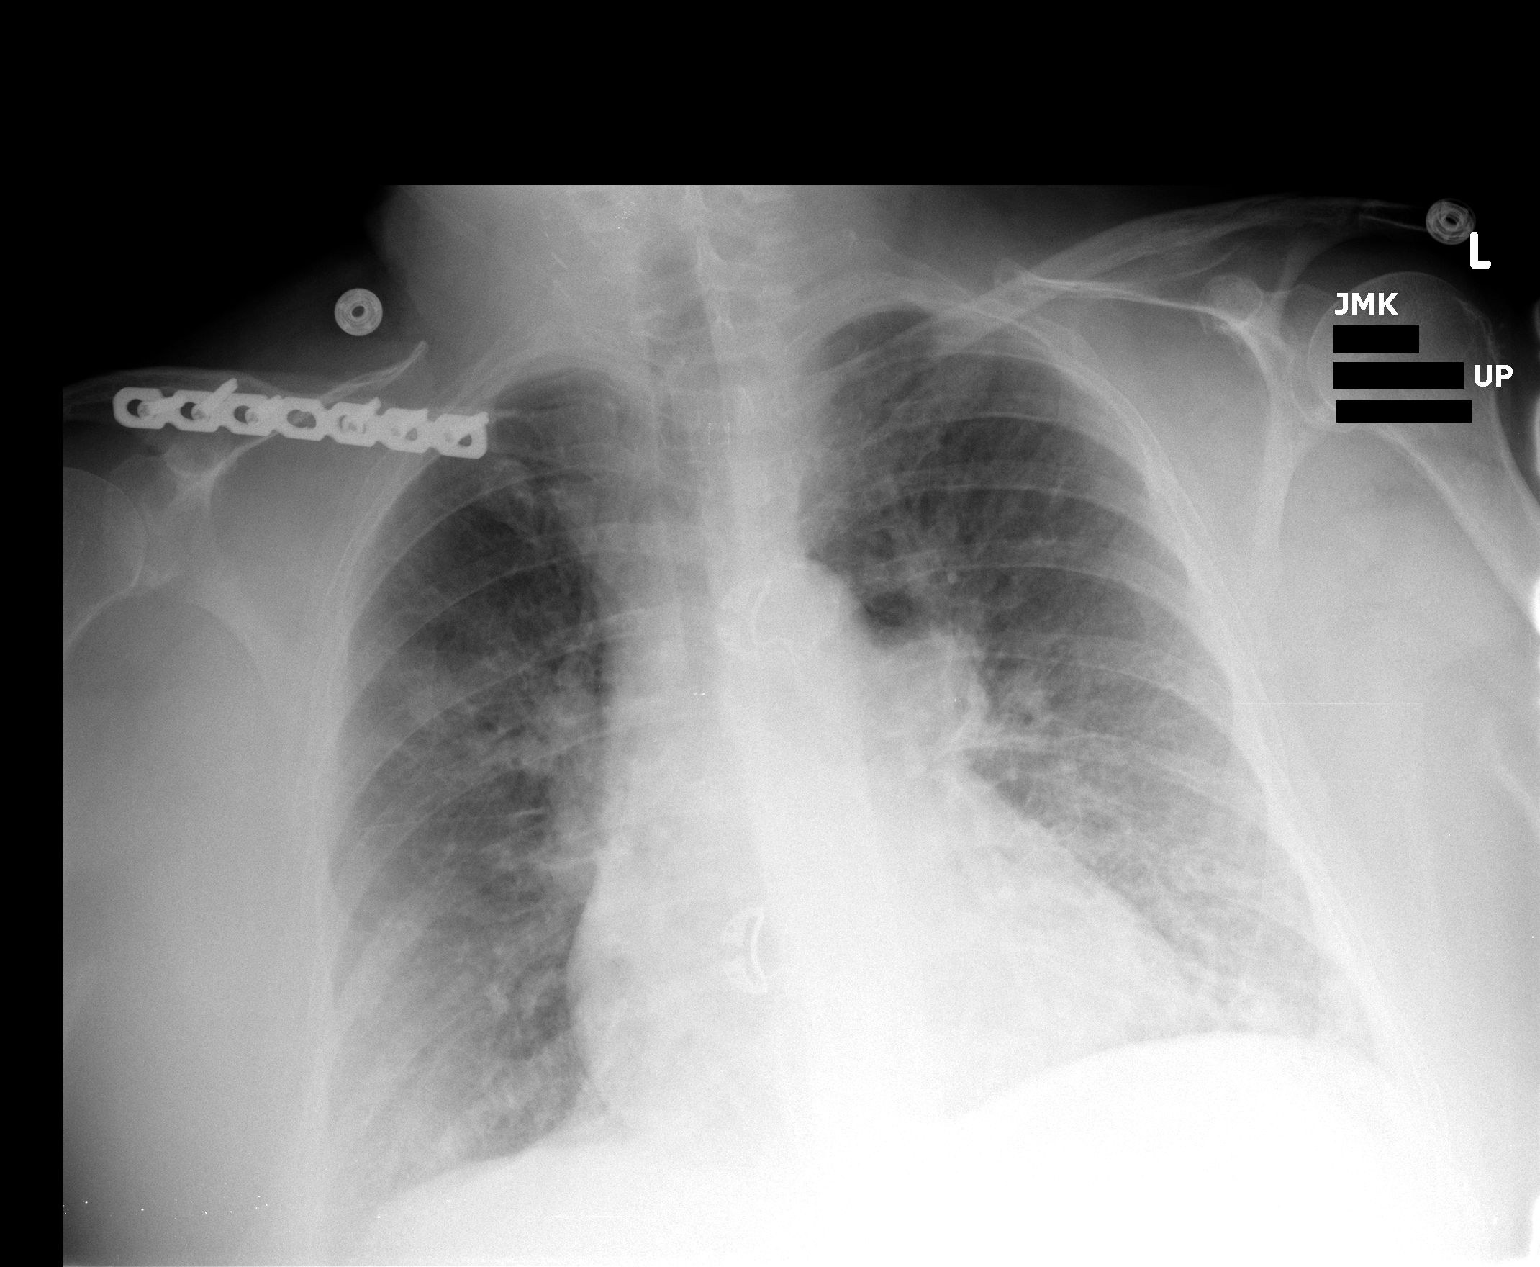

[1 of 1 positions shown; findings below may reference images not displayed]

FINDINGS: Stable interstitial edema.  Heart is borderline enlarged.
No pneumothorax and no pleural fluid.
IMPRESSION: Stable CHF.

## 2013-01-22 IMAGING — CT CT ANGIO CHEST
2 of 7 series · 18 of 36 positions shown · IV contrast (CONTRAST)
Comparison: Chest radiograph 09/15/2010, chest CT 06/25/2006

CLINICAL DATA: History of episodic atrial fibrillation.  Shortness
of breath.

CT ANGIOGRAPHY CHEST WITH CONTRAST
TECHNIQUE: Multidetector CT imaging of the chest was performed
using the standard protocol during bolus administration of
intravenous contrast.  Multiplanar CT image reconstructions
including MIPs were obtained to evaluate the vascular anatomy.
Contrast:  80 ml Omnipaque 300 IV contrast

[Series 5: pe thins · axial · 0.63mm/px · z∈[-258,-61]mm · 17 of 223 slices shown]
[im 13/223  lung]
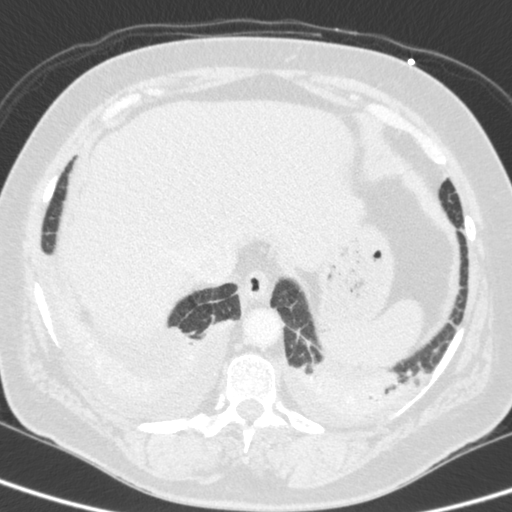
[im 25/223  mediastinal]
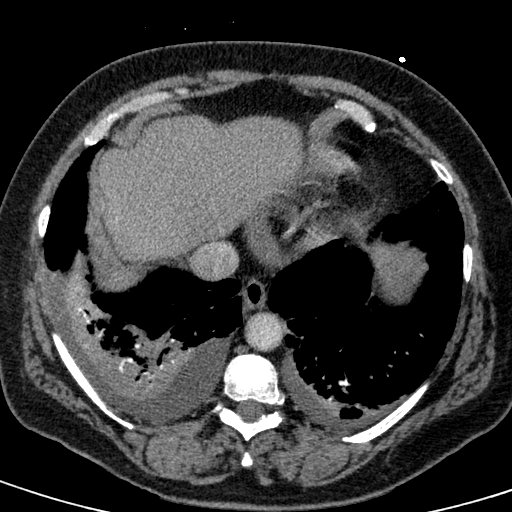
[im 38/223  lung]
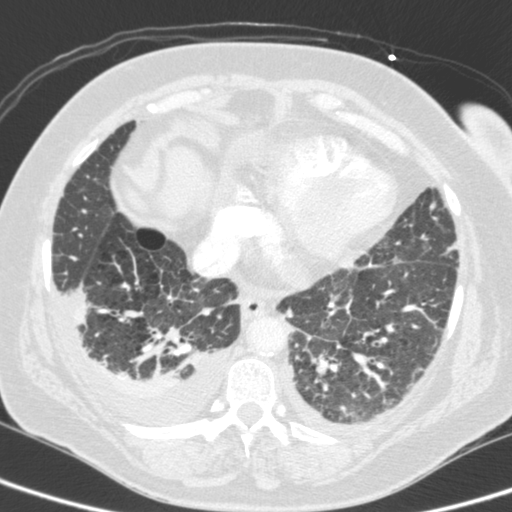
[im 50/223  mediastinal]
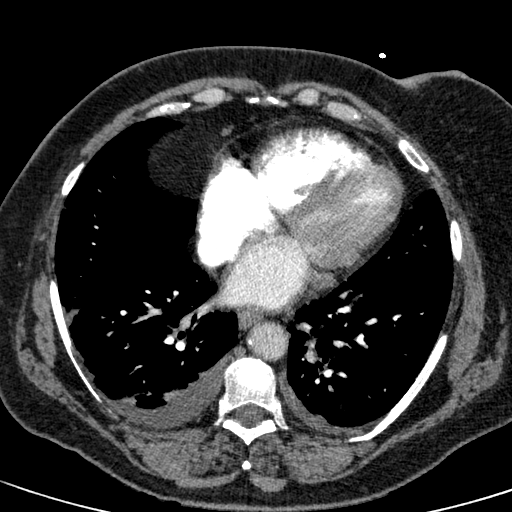
[im 62/223  lung]
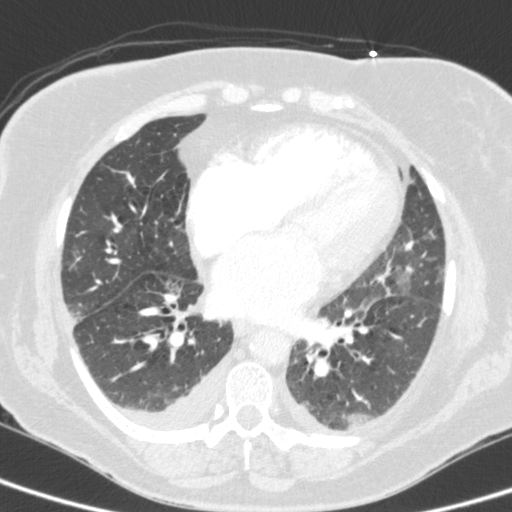
[im 75/223  mediastinal]
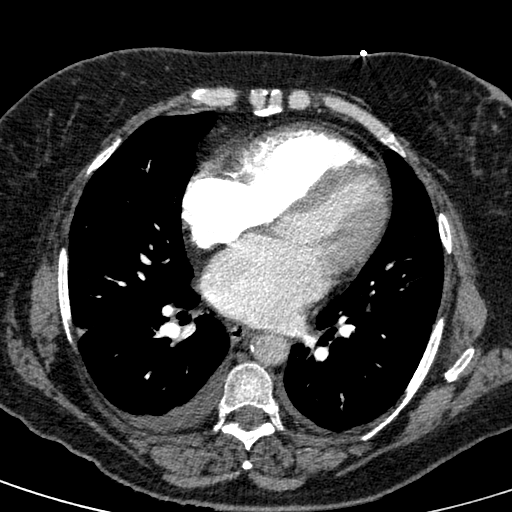
[im 87/223  lung]
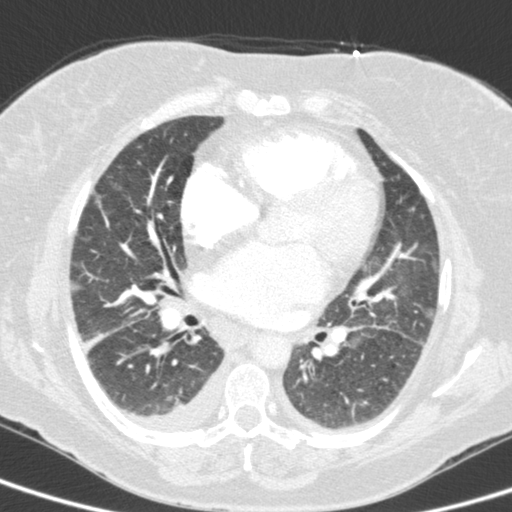
[im 99/223  mediastinal]
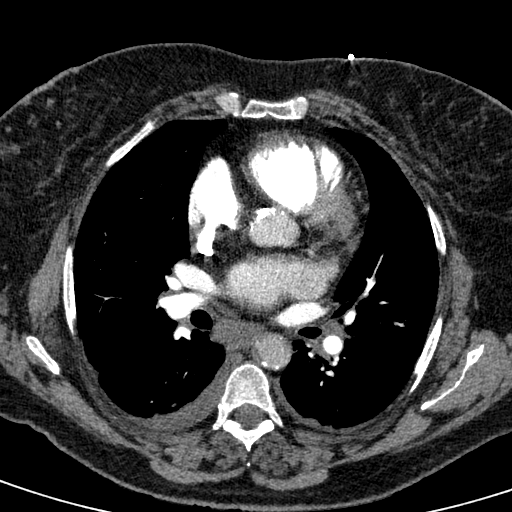
[im 112/223  lung]
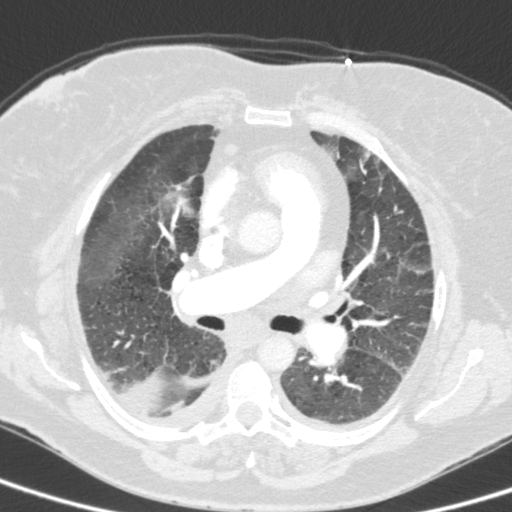
[im 124/223  mediastinal]
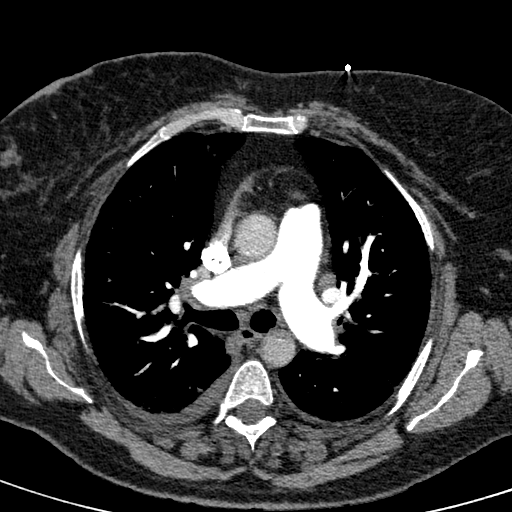
[im 136/223  lung]
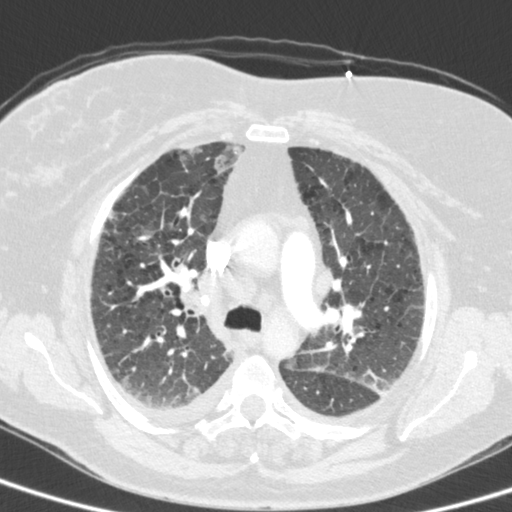
[im 149/223  mediastinal]
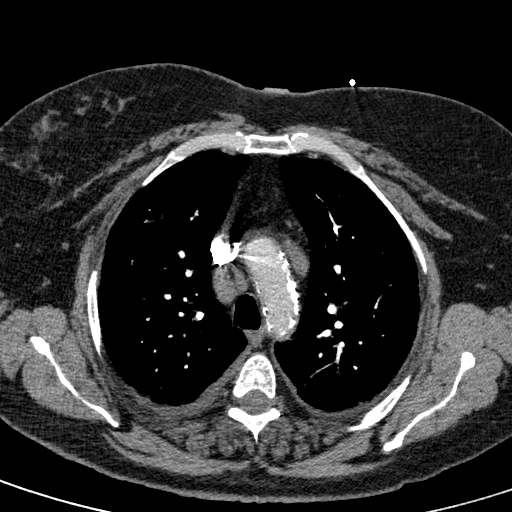
[im 161/223  lung]
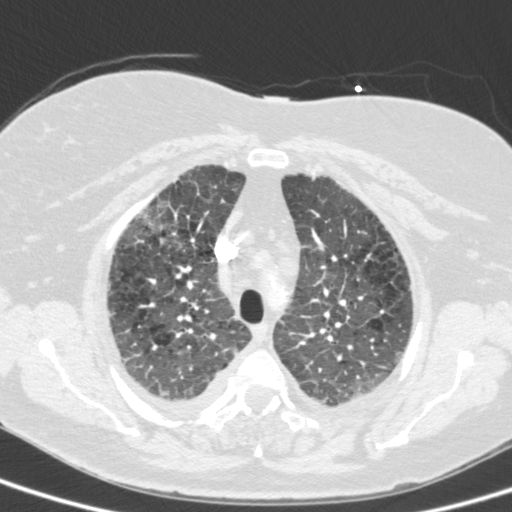
[im 173/223  mediastinal]
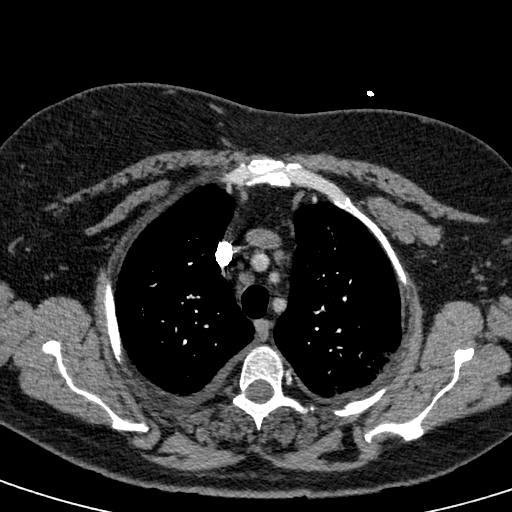
[im 186/223  lung]
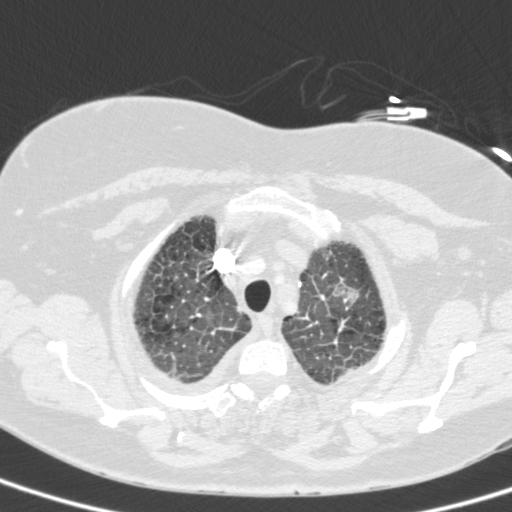
[im 198/223  mediastinal]
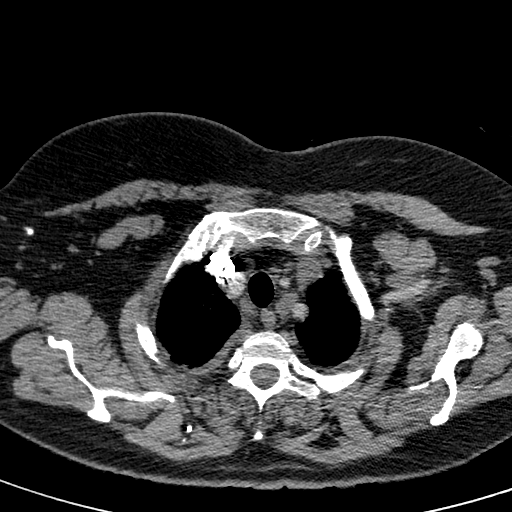
[im 210/223  lung]
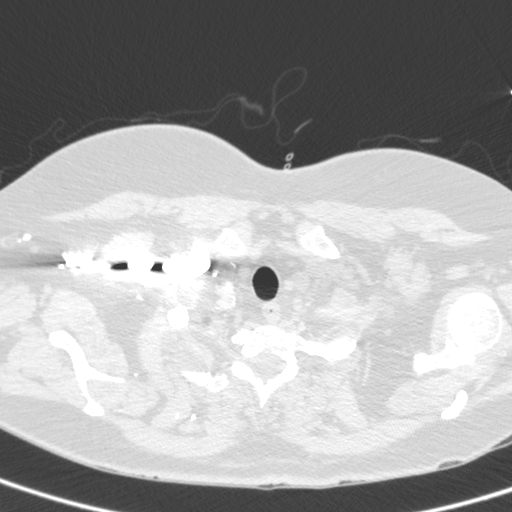

[coronal · coronal · 0.63mm/px · 1 of 117 slices shown]
[im 59/117  mediastinal]
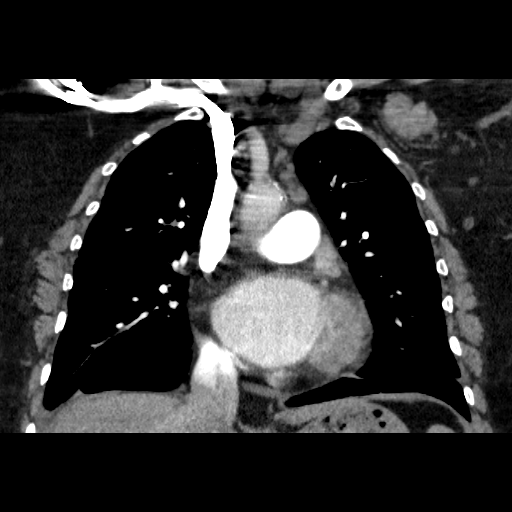

[18 of 36 positions shown; findings below may reference images not displayed]

FINDINGS: Mildly enlarged mediastinal lymph nodes are now noted,
largest measuring 1.2 cm in the AP window lymph node region, image
36, previously 0.7 cm.  Confluent pretracheal lymphadenopathy
measures 1.8 cm on image 39, previously 1.4 cm.  Small pericardial
effusion is present. Representative epicardial lymph node is
slightly increased at 0.6 cm, previously 0.4 cm.  Heart size is
mildly enlarged, with left atrial prominence.  Atherosclerotic
aortic calcification noted without aneurysm.

Small right and trace left pleural effusions are noted with
associated compressive atelectasis.  No axillary lymphadenopathy.

The study is of adequate technical quality for evaluation for
pulmonary embolism up to and including the 3rd order pulmonary
arteries. No focal filling defect is seen to suggest acute
pulmonary embolism.

Lung fields demonstrate emphysematous changes.  Areas of patchy
ground-glass airspace opacity are noted with mild interlobular
septal thickening and central bronchial wall thickening.  Central
airways are patent.

No acute osseous abnormality.

Review of the MIP images confirms the above findings.
IMPRESSION: No CT evidence for acute pulmonary embolism.

Cardiomegaly with findings of probable interstitial and minimal
alveolar pulmonary edema.  Other alveolar filling processes could
have a similar appearance.

Bilateral lower lobe probable compressive atelectasis associated
with small pleural effusions.

## 2013-01-23 IMAGING — CR DG CHEST 1V PORT
1 series · 1 of 1 positions shown · non-contrast
Comparison: 09/17/2010 [DATE] hours

CLINICAL DATA: Respiratory distress

PORTABLE CHEST - 1 VIEW

[AP]
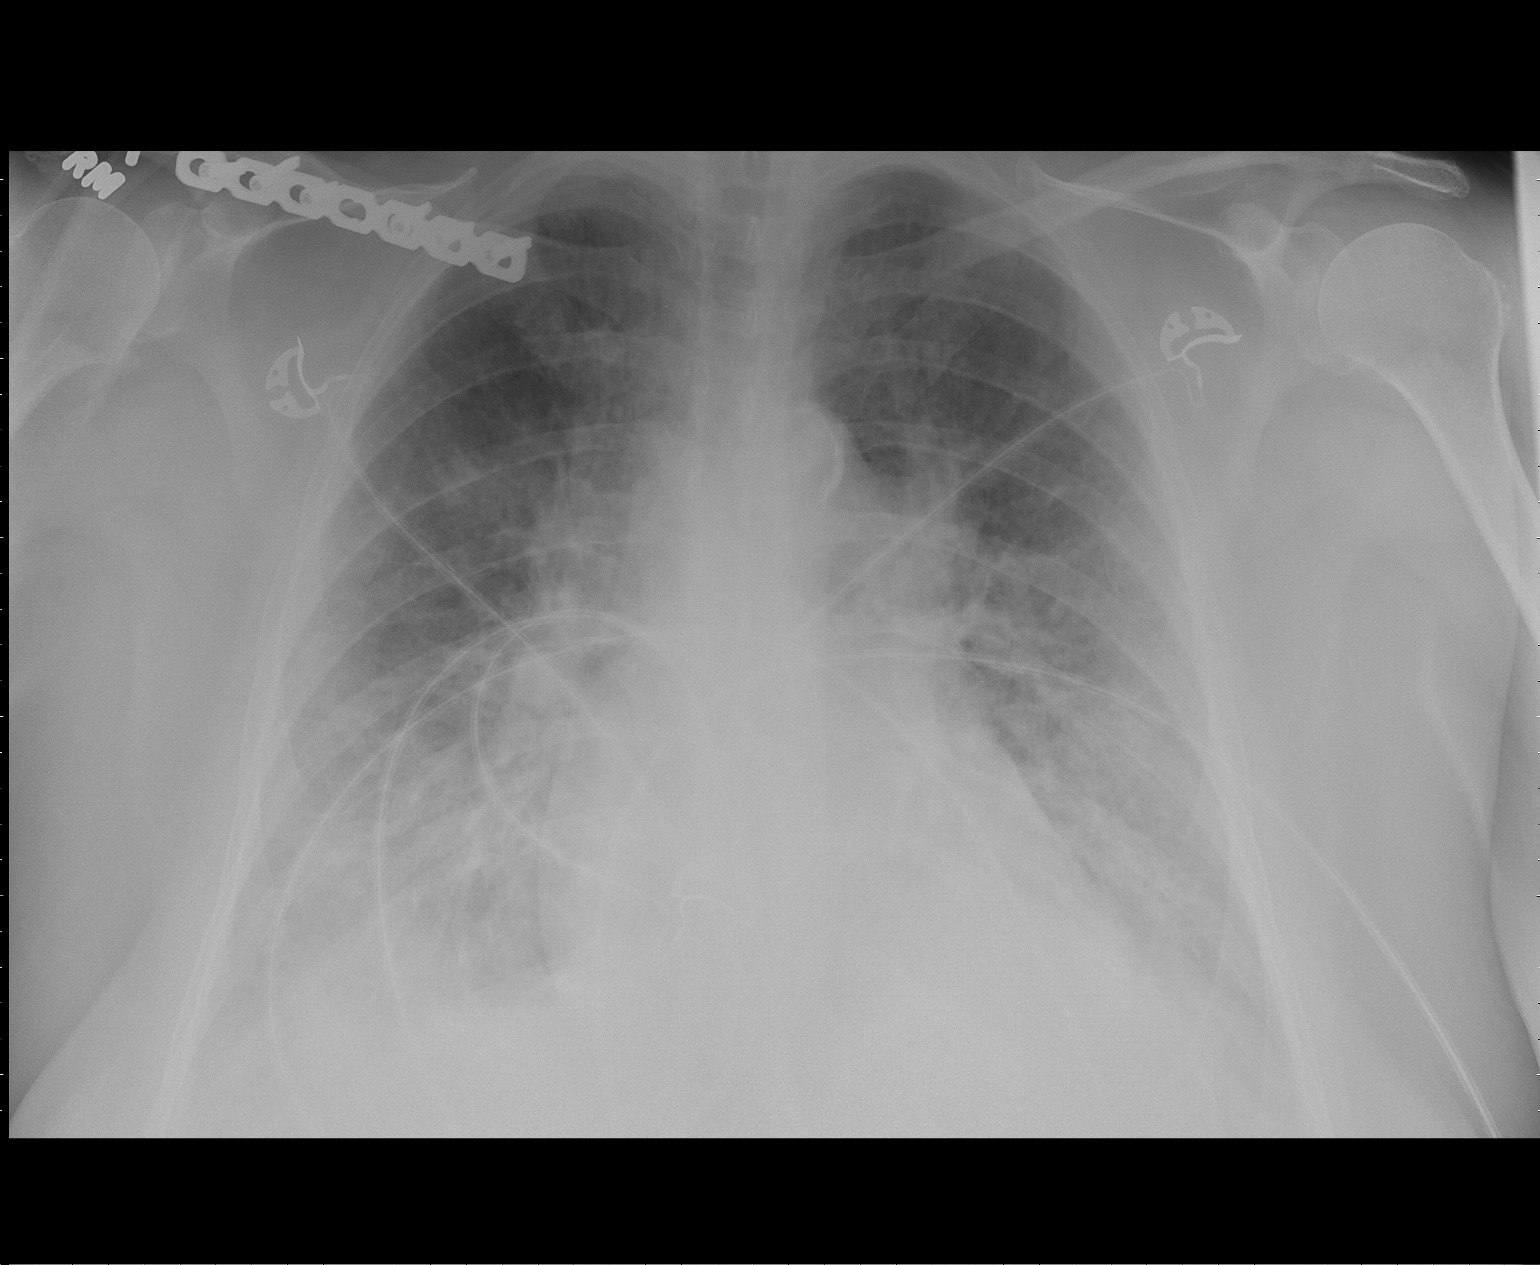

[1 of 1 positions shown; findings below may reference images not displayed]

FINDINGS: Pulmonary edema is worse.  Heart is upper normal in size.
No pneumothorax.
IMPRESSION: Worsening edema.

## 2013-01-23 IMAGING — CR DG CHEST 1V PORT
1 series · 1 of 1 positions shown · non-contrast
Comparison: Chest x-ray 09/15/2010.

CLINICAL DATA: Pulmonary edema.

PORTABLE CHEST - 1 VIEW

[AP]
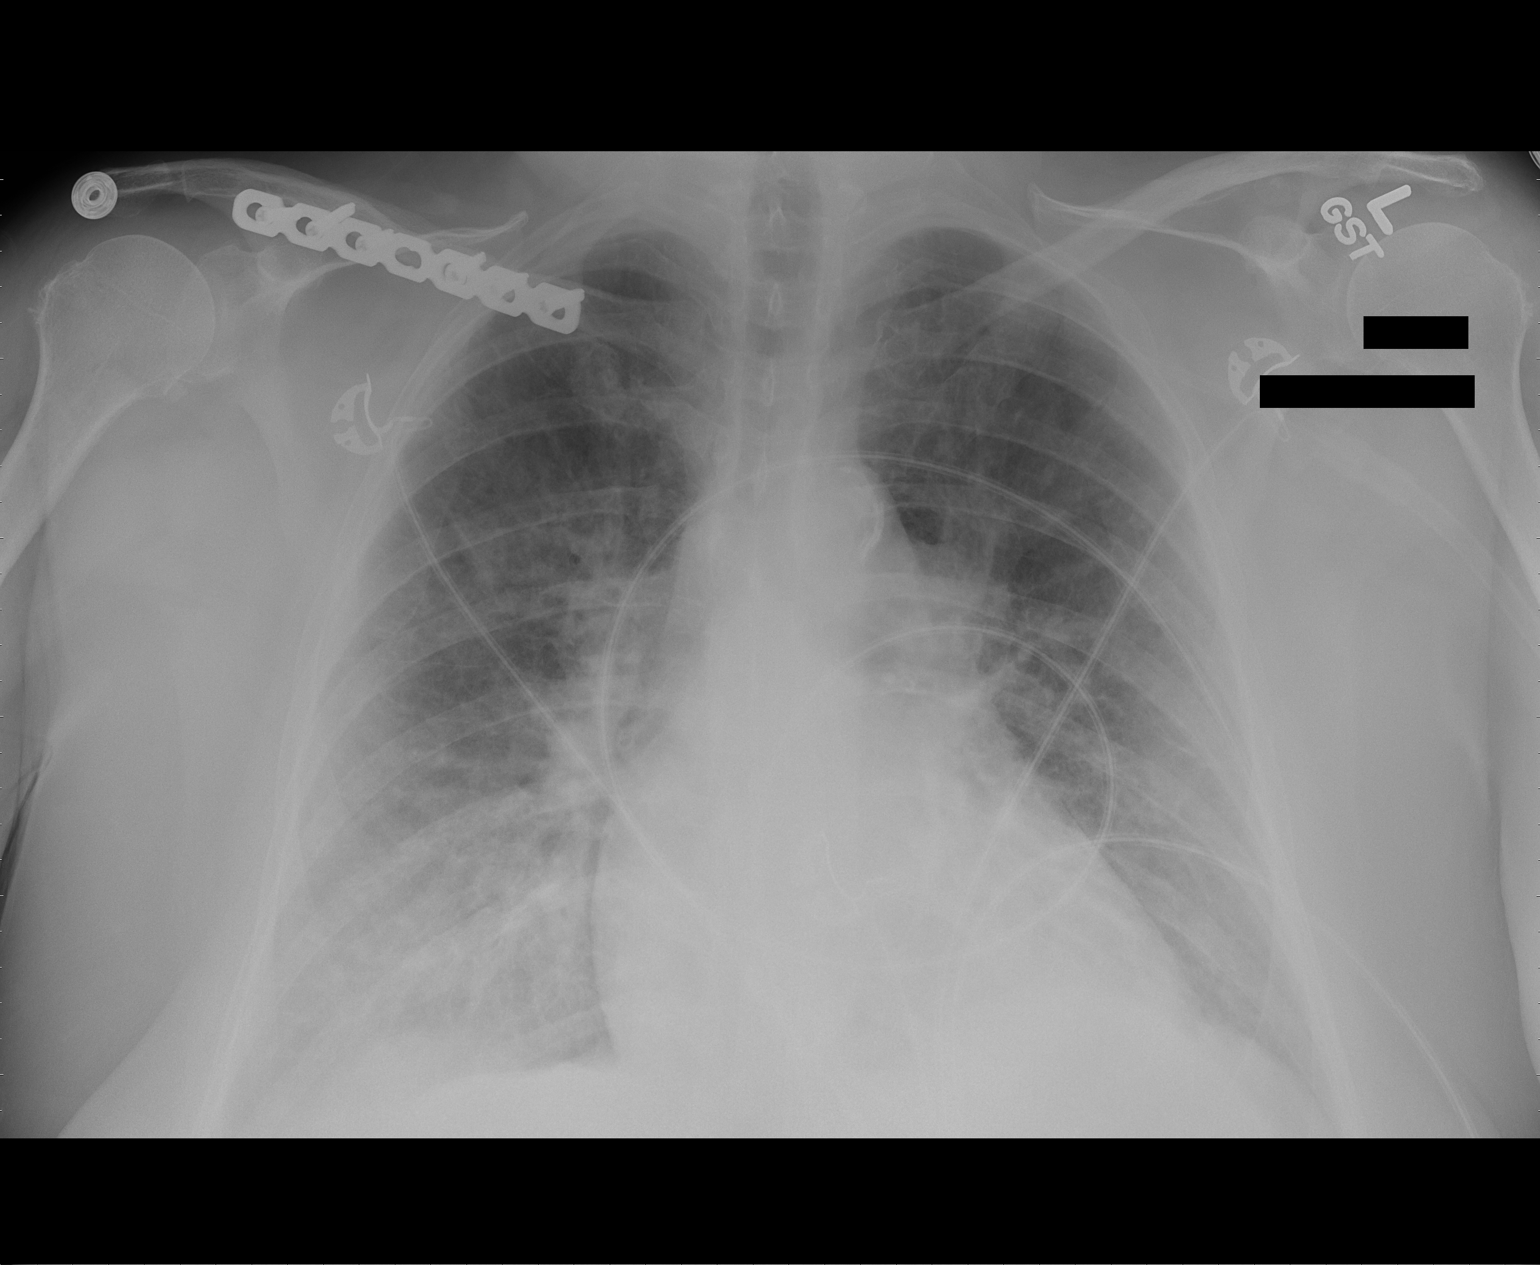

[1 of 1 positions shown; findings below may reference images not displayed]

FINDINGS: The cardiac silhouette, mediastinal and hilar contours
are stable.  There is diffuse interstitial and airspace process
which may reflect edema.  No large pleural effusions.
IMPRESSION: Persistent interstitial and airspace process, right greater than
left.

## 2013-01-24 IMAGING — CT CT HEAD W/O CM
1 of 2 series · 13 of 30 positions shown, 17 images · non-contrast
Comparison: MRI brain dated 08/03/2010

CLINICAL DATA: Altered mental status

CT HEAD WITHOUT CONTRAST
TECHNIQUE: Contiguous axial images were obtained from the base of
the skull through the vertex without contrast.

[Series 2: brain · axial · 0.47mm/px · z∈[+113,+250]mm · 13 of 32 slices shown, 17 images]
[im 3/32  brain]
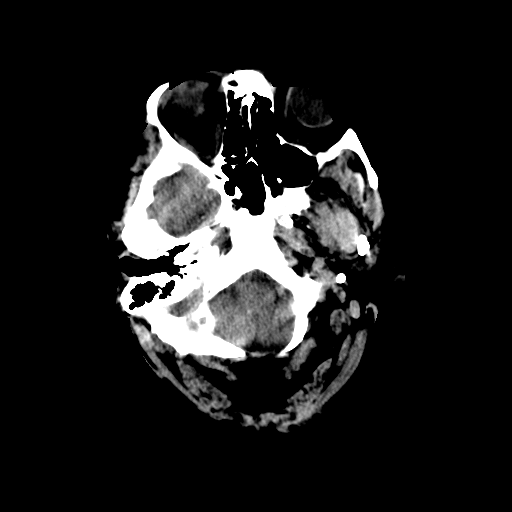
[im 3/32  bone]
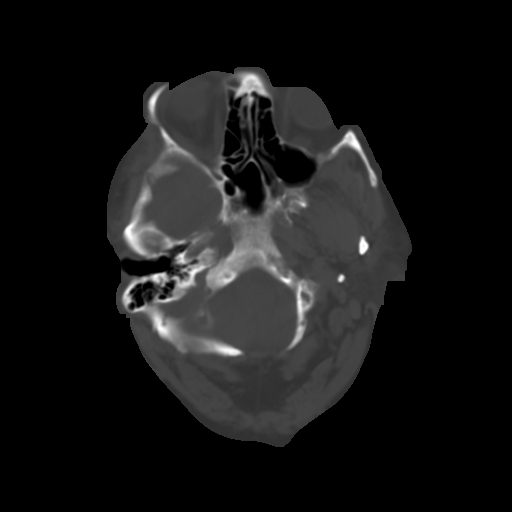
[im 5/32  brain]
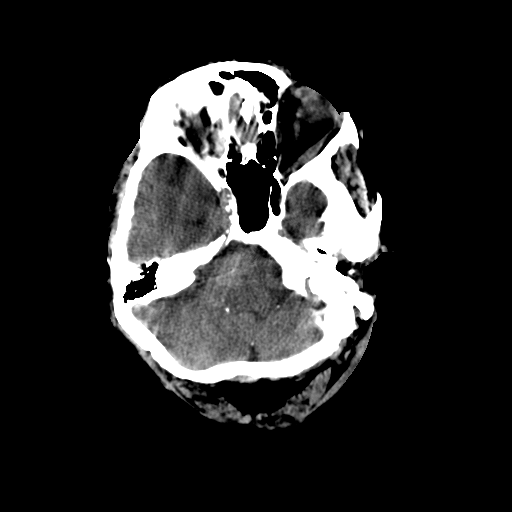
[im 7/32  brain]
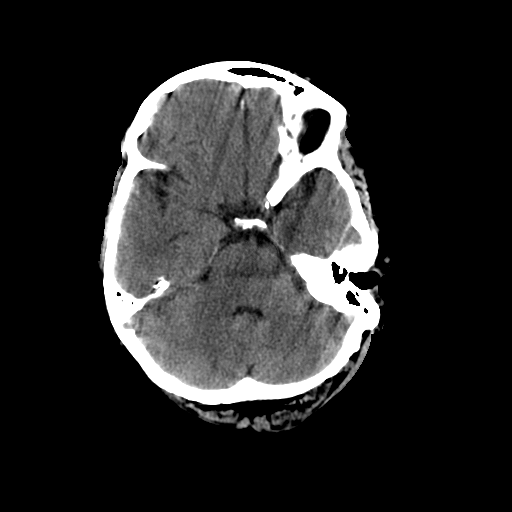
[im 9/32  brain]
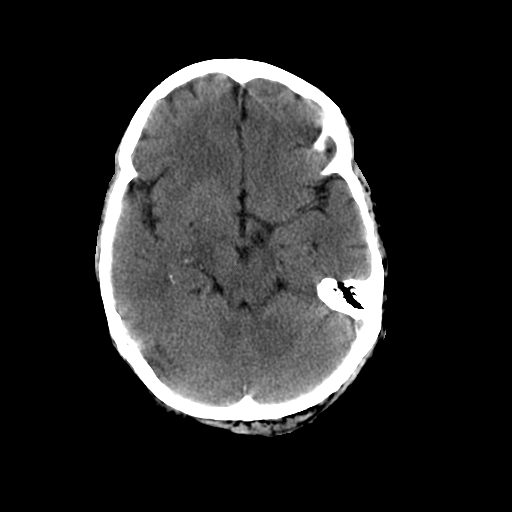
[im 12/32  brain]
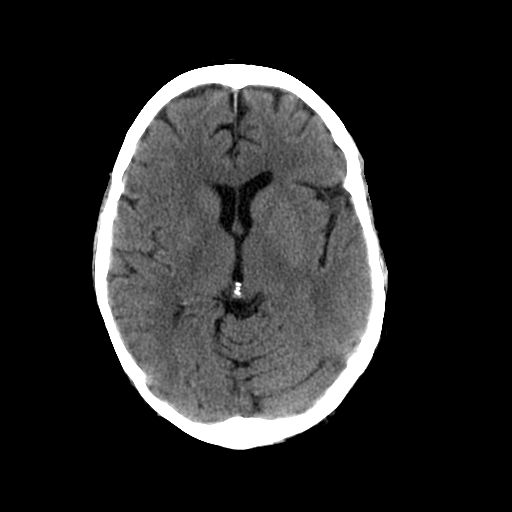
[im 12/32  bone]
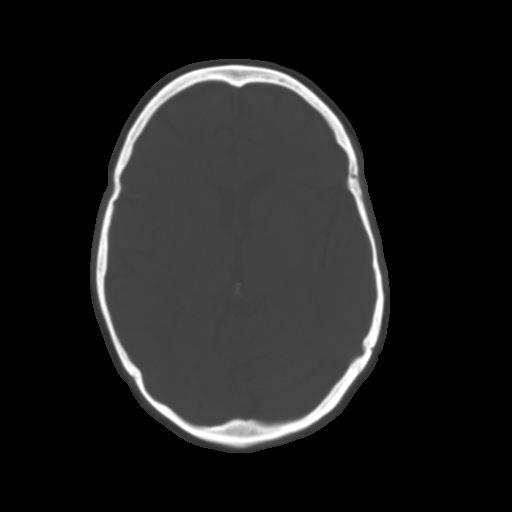
[im 14/32  brain]
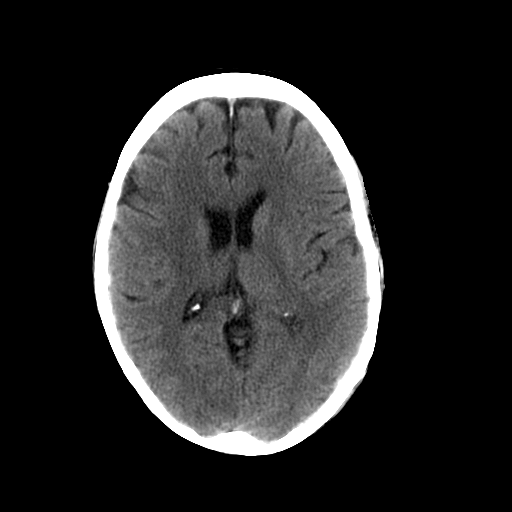
[im 16/32  brain]
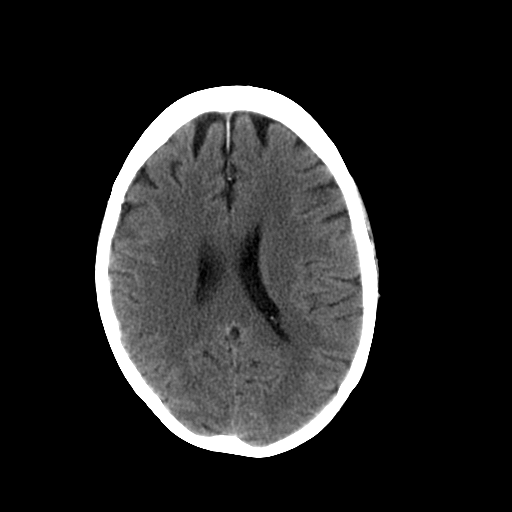
[im 18/32  brain]
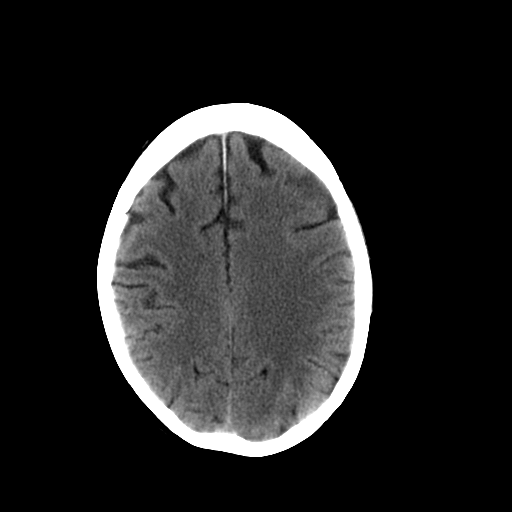
[im 20/32  brain]
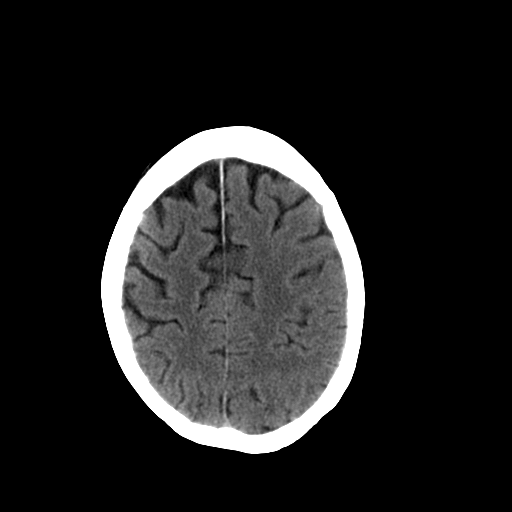
[im 20/32  bone]
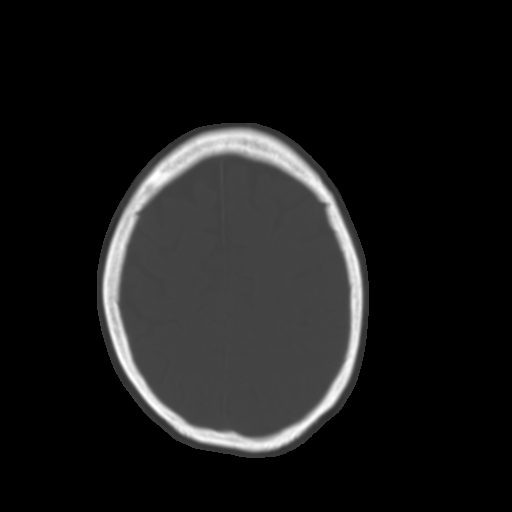
[im 23/32  brain]
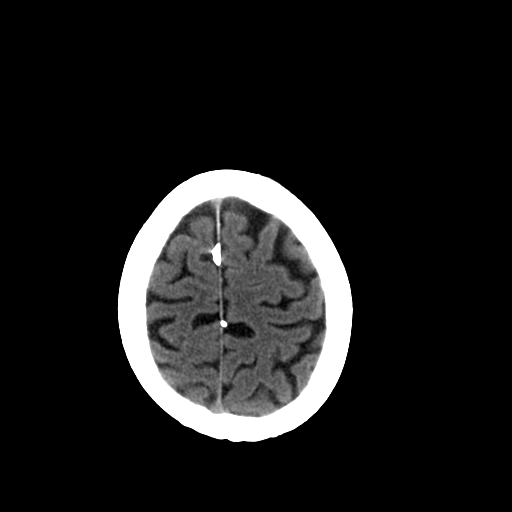
[im 25/32  brain]
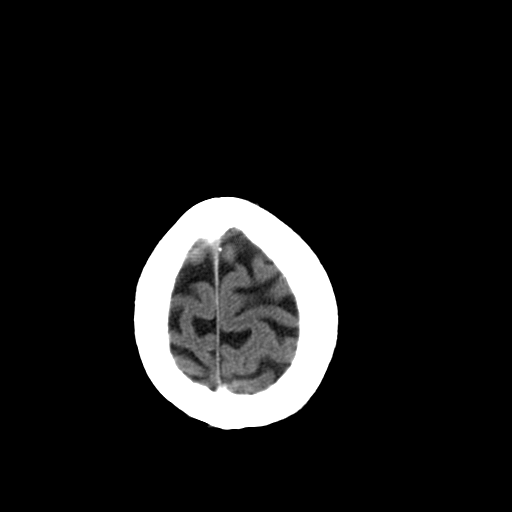
[im 27/32  brain]
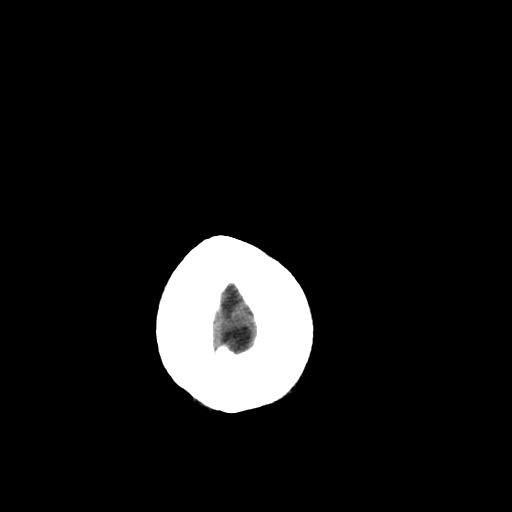
[im 29/32  brain]
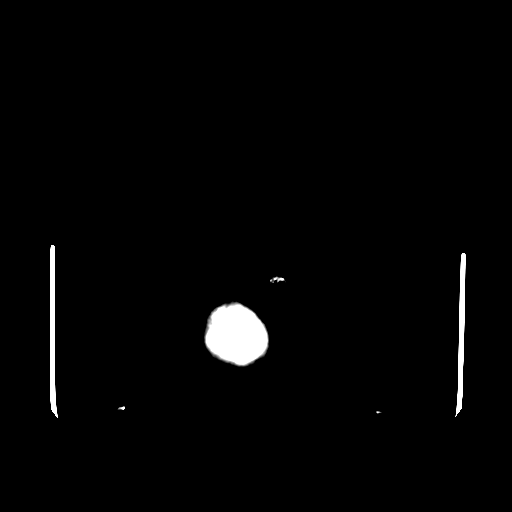
[im 29/32  bone]
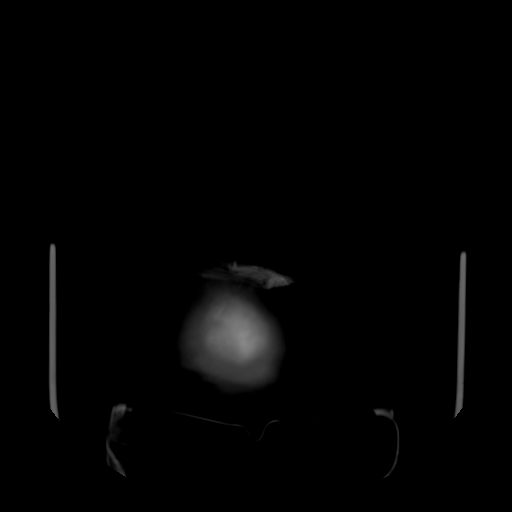

[13 of 30 positions shown; findings below may reference images not displayed]

FINDINGS: No evidence of parenchymal hemorrhage or extra-axial
fluid collection. No mass lesion, mass effect, or midline shift.

No CT evidence of acute infarction.

Cerebral volume is age appropriate.  No ventriculomegaly.

The visualized paranasal sinuses are essentially clear. The mastoid
air cells are unopacified.

No evidence of calvarial fracture.
IMPRESSION: Normal head CT.

## 2013-01-24 IMAGING — CR DG CHEST 1V PORT
1 series · 1 of 1 positions shown · non-contrast
Comparison: Single view chest 09/17/2010.

CLINICAL DATA: Chest pain and edema.

PORTABLE CHEST - 1 VIEW

[view not recorded]
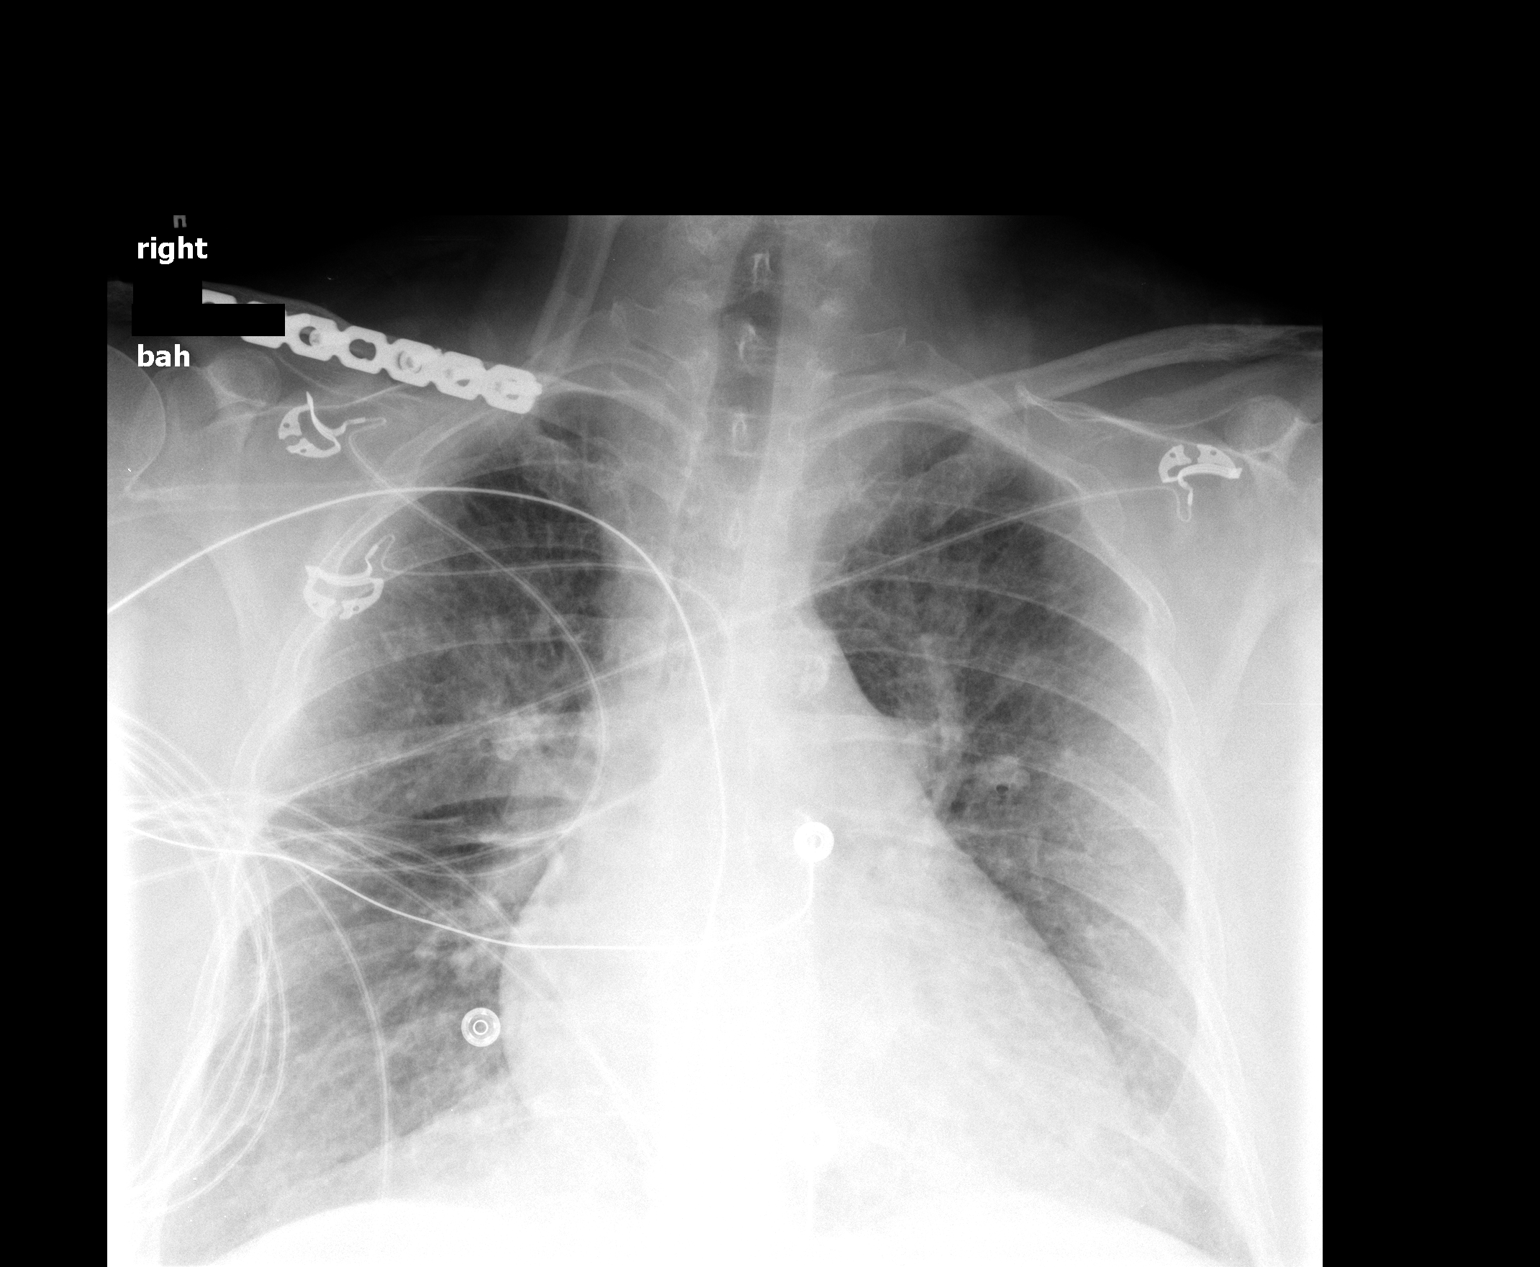

[1 of 1 positions shown; findings below may reference images not displayed]

FINDINGS: The heart is mildly enlarged.  Mild interstitial edema is
improved from prior exam.  Bibasilar airspace disease likely
reflects atelectasis.  The postoperative changes of the right
clavicle are again noted.
IMPRESSION: 1.  Improving interstitial edema.
2.  Residual bibasilar atelectasis.

## 2013-01-24 IMAGING — CT CT ABD-PELV W/ CM
2 of 5 series · 14 of 32 positions shown, 19 images · IV contrast (agent unspecified)
Comparison: Abdominal ultrasound 09/02/2010 and chest CT
09/16/2010.

CLINICAL DATA: Abdominal pain and anemia. Mediastinal adenopathy
demonstrated on recent chest CT.  Cancer workup.

CT ABDOMEN AND PELVIS WITH CONTRAST
TECHNIQUE: Multidetector CT imaging of the abdomen and pelvis was
performed following the standard protocol during bolus
administration of intravenous contrast.
Contrast: 100 ml 0mnipaque-ACC intravenously.

[Series 2: routine abdomen · axial · 0.88mm/px · z∈[-446,-112]mm · 6 of 95 slices shown, 11 images]
[im 14/95  soft-tissue]
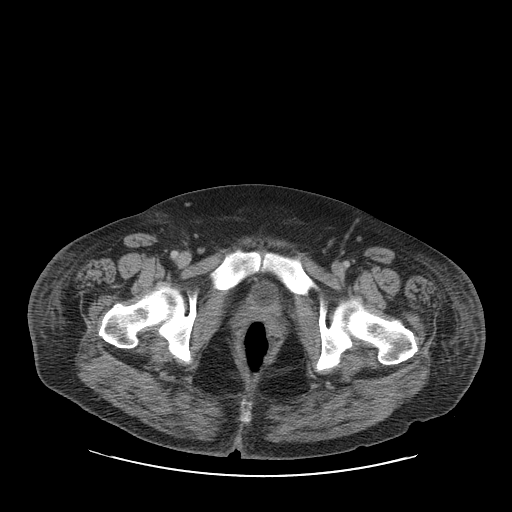
[im 14/95  bone]
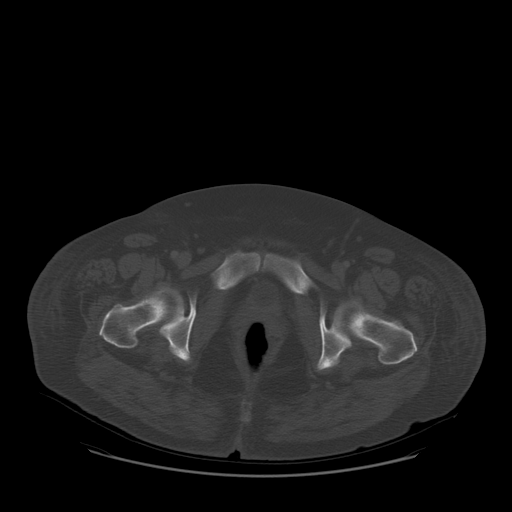
[im 27/95  soft-tissue]
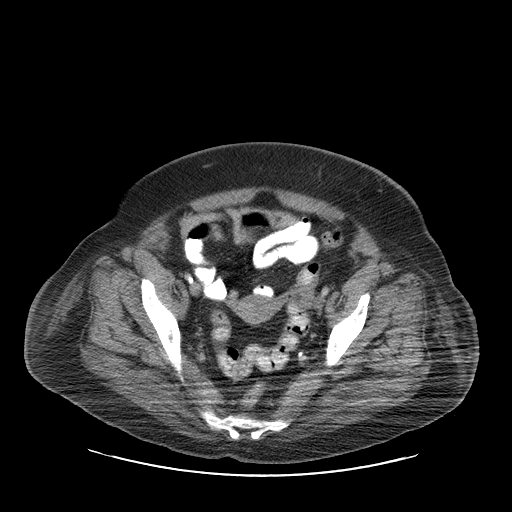
[im 41/95  soft-tissue]
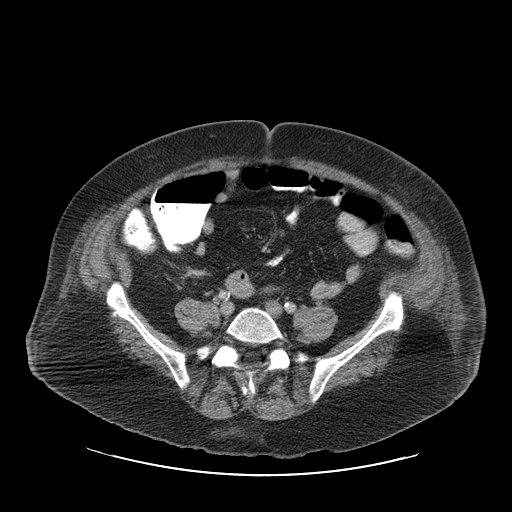
[im 41/95  lung]
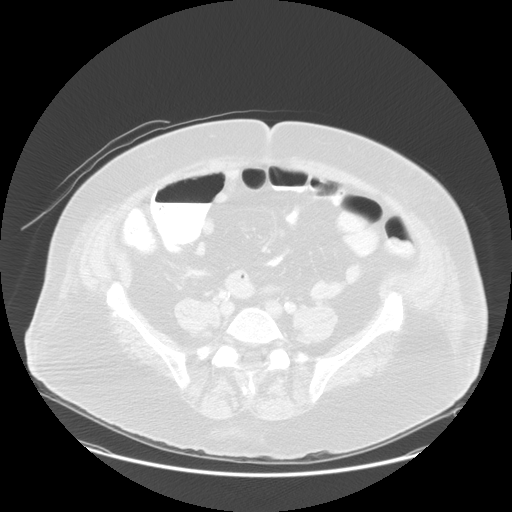
[im 54/95  soft-tissue]
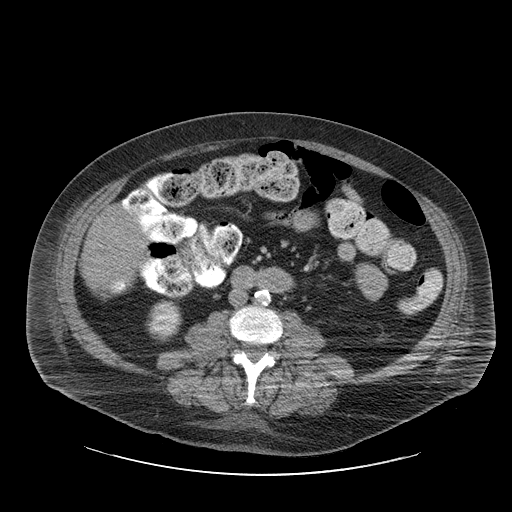
[im 54/95  lung]
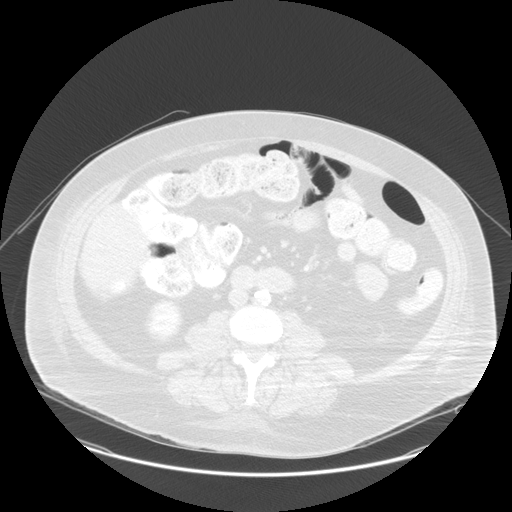
[im 68/95  soft-tissue]
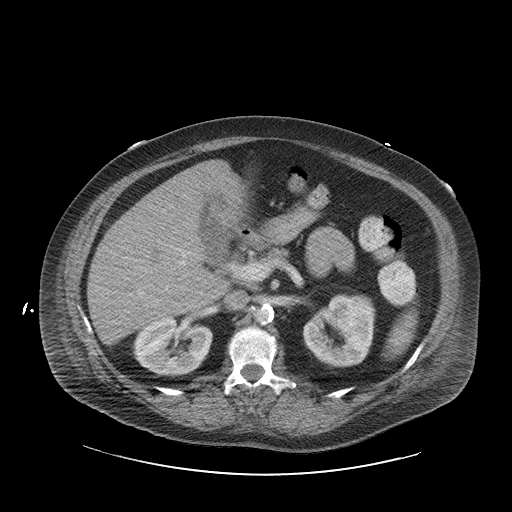
[im 68/95  lung]
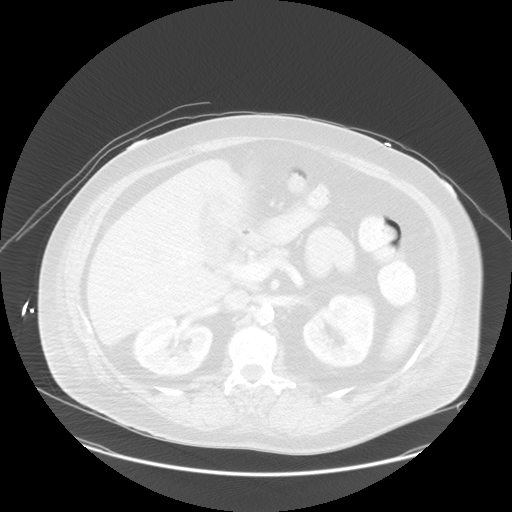
[im 81/95  soft-tissue]
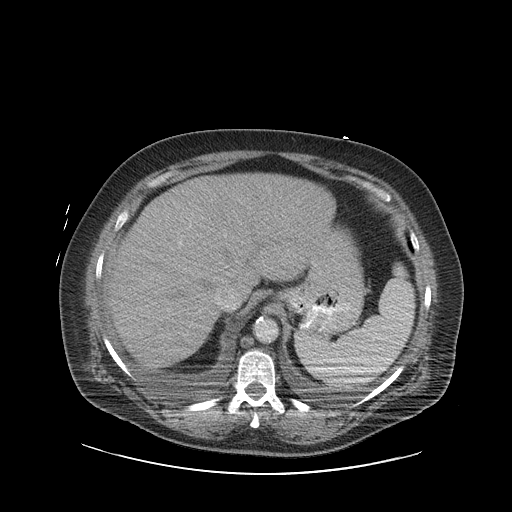
[im 81/95  lung]
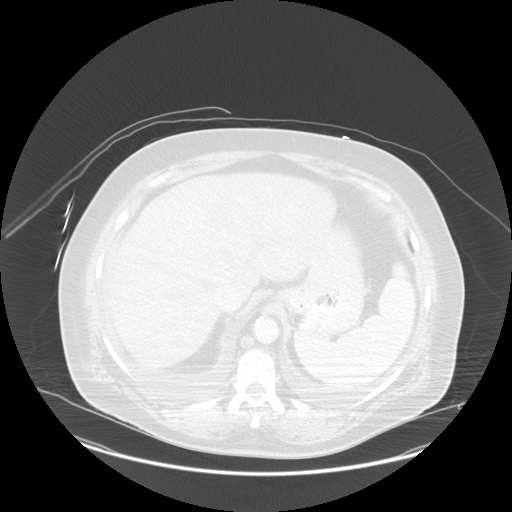

[Series 401: sag · sagittal · 0.94mm/px · 8 of 141 slices shown]
[im 15/141  soft-tissue]
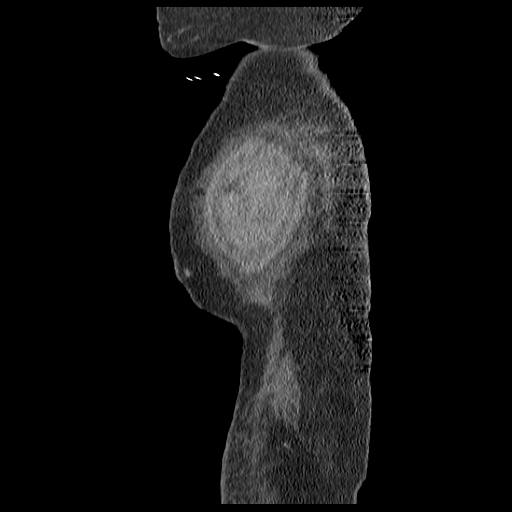
[im 29/141  soft-tissue]
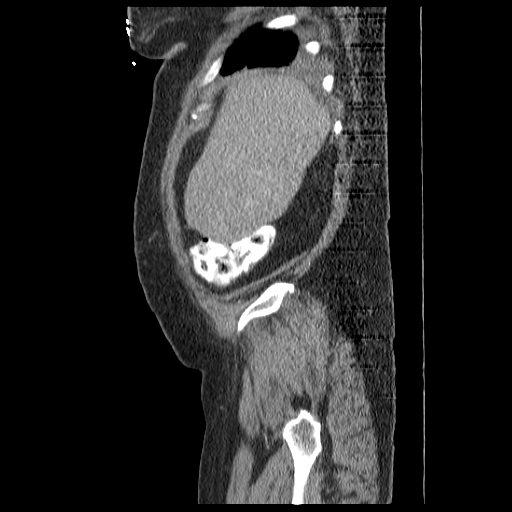
[im 43/141  soft-tissue]
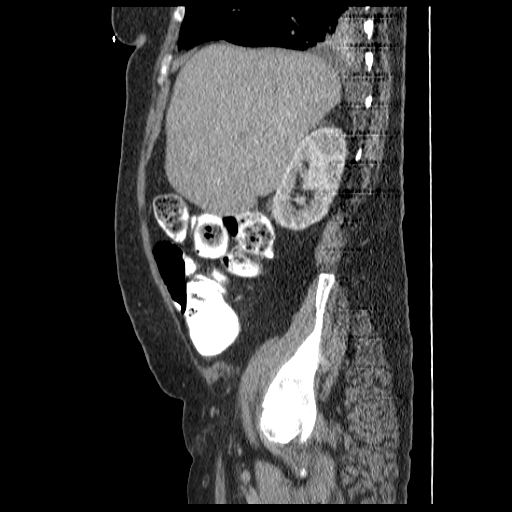
[im 57/141  soft-tissue]
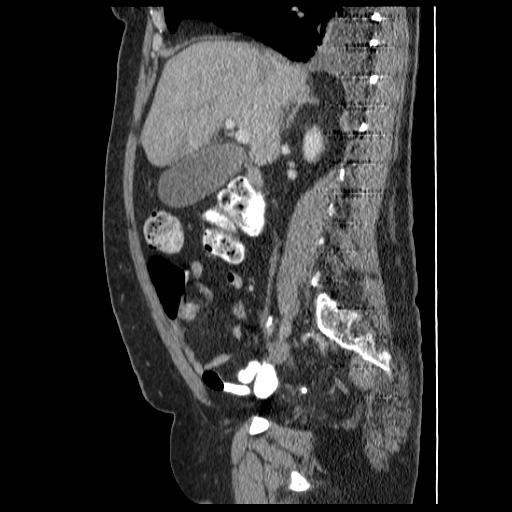
[im 85/141  soft-tissue]
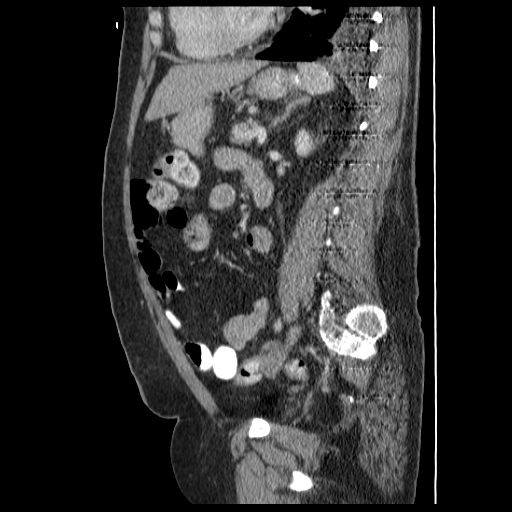
[im 99/141  soft-tissue]
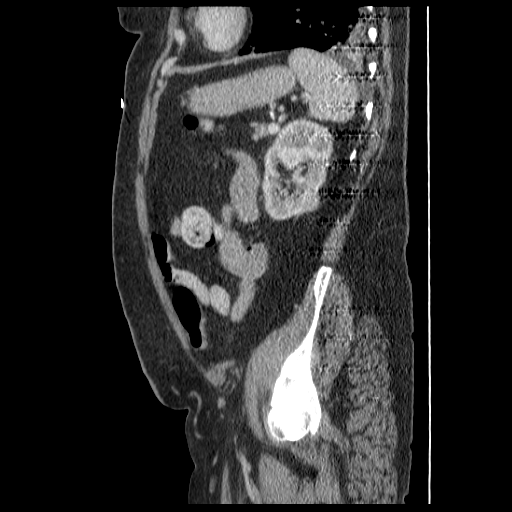
[im 113/141  soft-tissue]
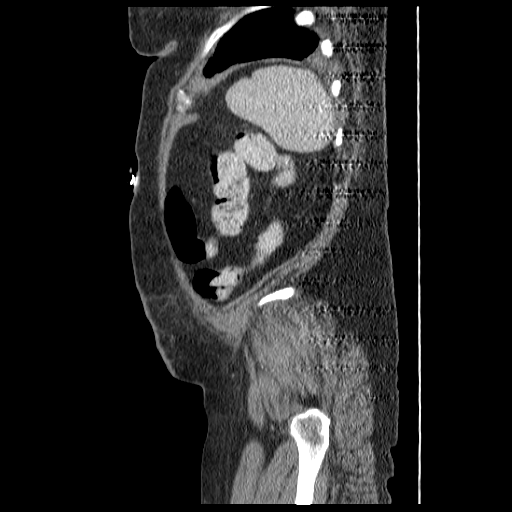
[im 127/141  soft-tissue]
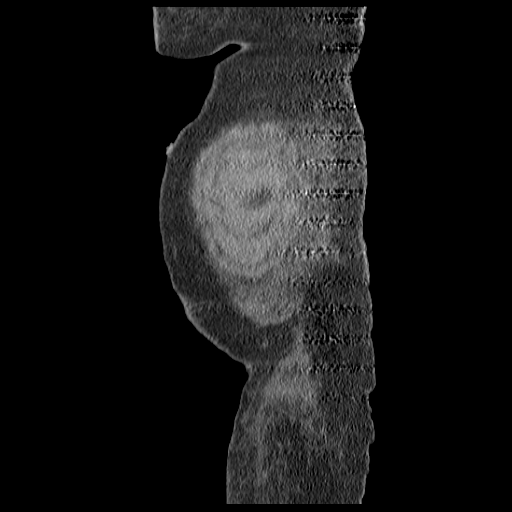

[14 of 32 positions shown; findings below may reference images not displayed]

FINDINGS: There are persistent bilateral pleural effusions with
interval slight worsening on the left.  Bibasilar atelectasis
appears about the same.  Small right paracardiac lymph nodes are
stable.

There is nonspecific ill-defined low density in the left hepatic
lobe just superior to the gallbladder on images 27 - 29.  The liver
otherwise appears unremarkable.  The gallbladder and biliary system
appear normal.  The pancreas is atrophied without evidence of mass,
ductal dilatation or surrounding inflammation.

The spleen and adrenal glands appear normal.  There is a probable
small nonobstructing calculus in the lower pole of the right
kidney.  There is no renal mass or hydronephrosis.

The bowel gas pattern is normal.  The appendix appears normal.
There is no ascites or lymphadenopathy.  Bilateral flank edema is
noted.  There is no evidence of pelvic mass.  The uterus and
ovaries appear normal.  Foley catheter is in place.

There is mild aorto iliac atherosclerosis.  There are no acute or
suspicious osseous findings.
IMPRESSION: 1.  No definite signs of intra-abdominal malignancy.  There are no
enlarged abdominal pelvic lymph nodes.
2.  Nonspecific ill-defined low density within the liver adjacent
to the gallbladder without well-defined mass.  The significance of
this is doubtful.
3.  Bilateral flank edema and bilateral pleural effusions.

## 2013-01-25 IMAGING — CR DG CHEST 1V PORT
1 series · 1 of 1 positions shown · non-contrast
Comparison: 09/18/2010, [DATE], and 09/15/2010

CLINICAL DATA: Shortness of breath.

PORTABLE CHEST - 1 VIEW

[AP]
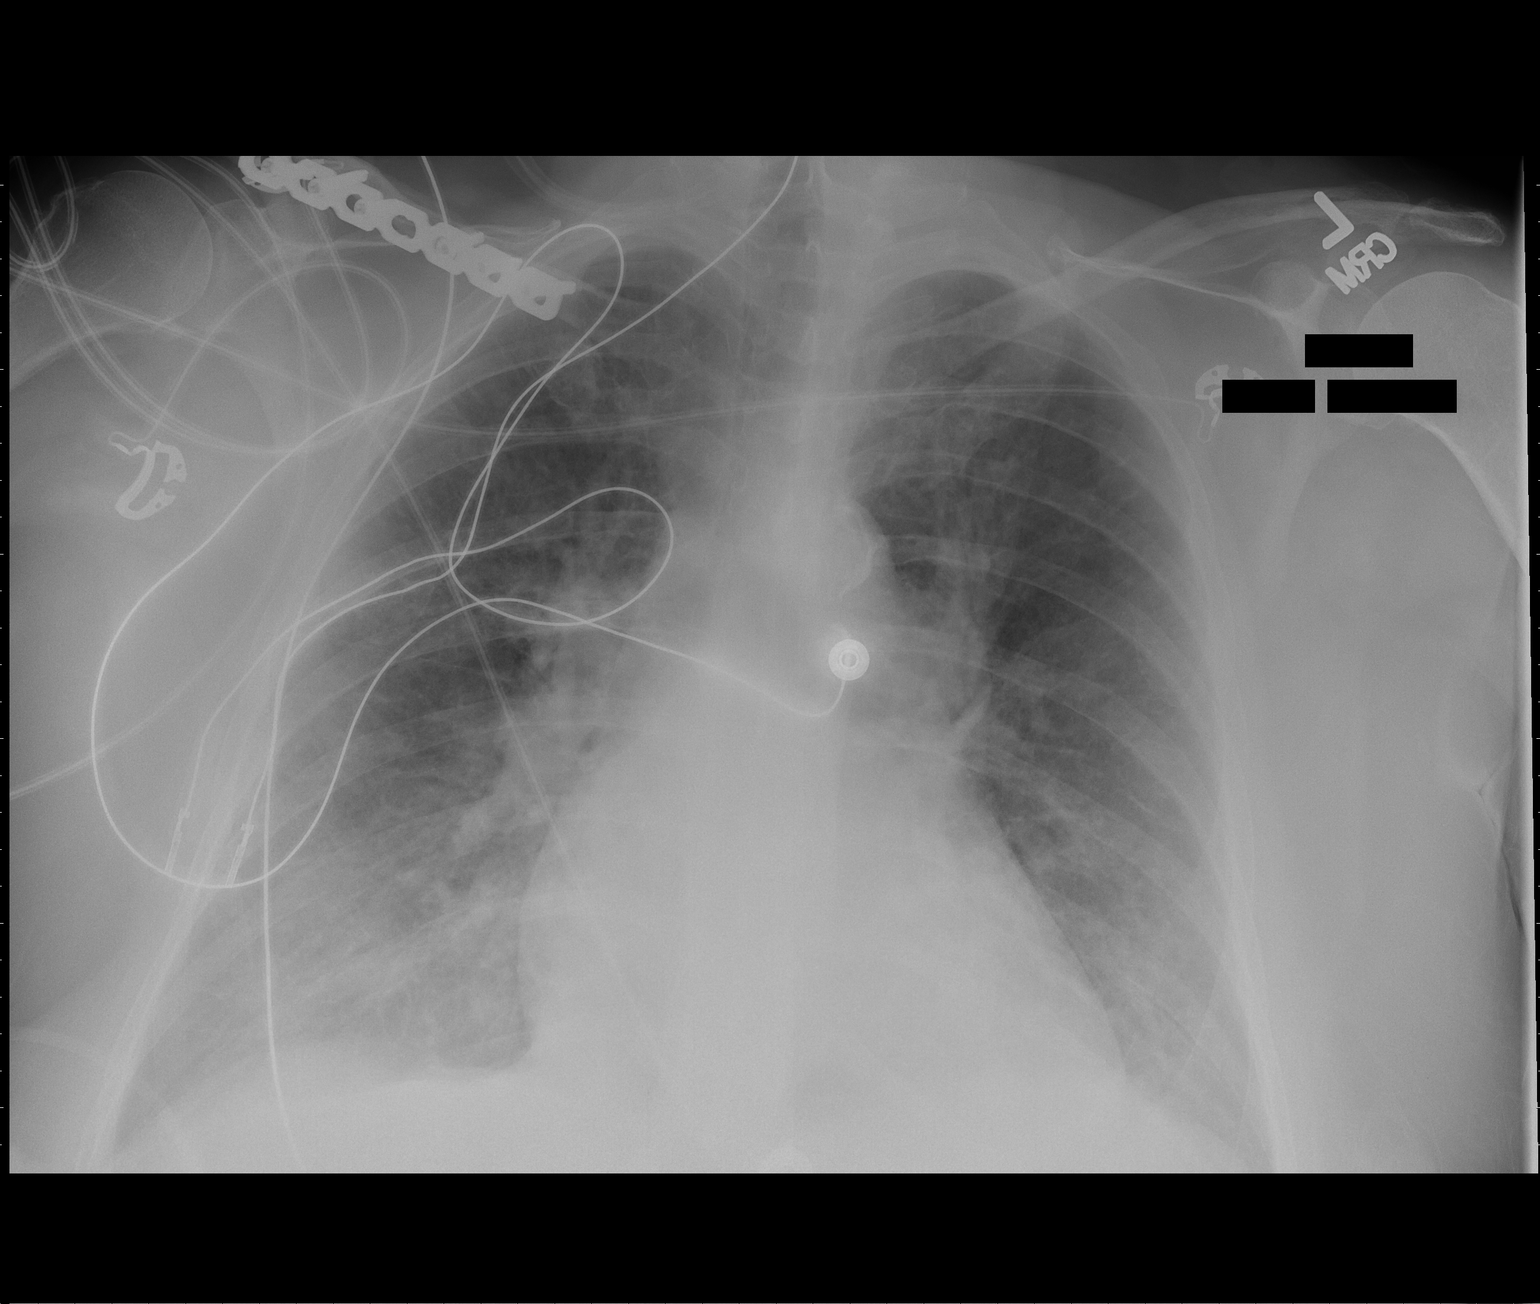

[1 of 1 positions shown; findings below may reference images not displayed]

FINDINGS: There has been recurrence of the edema at the right lung
base.  Pulmonary vascularity has increased slightly.  Heart size is
stable.
IMPRESSION: Recurrent mild pulmonary edema and increased vascular prominence.

## 2013-01-26 IMAGING — CR DG CHEST 1V PORT
1 series · 1 of 1 positions shown · non-contrast
Comparison: Portable chest x-ray of 09/19/2010

CLINICAL DATA: Status post mitral valve replacement

PORTABLE CHEST - 1 VIEW

[view not recorded]
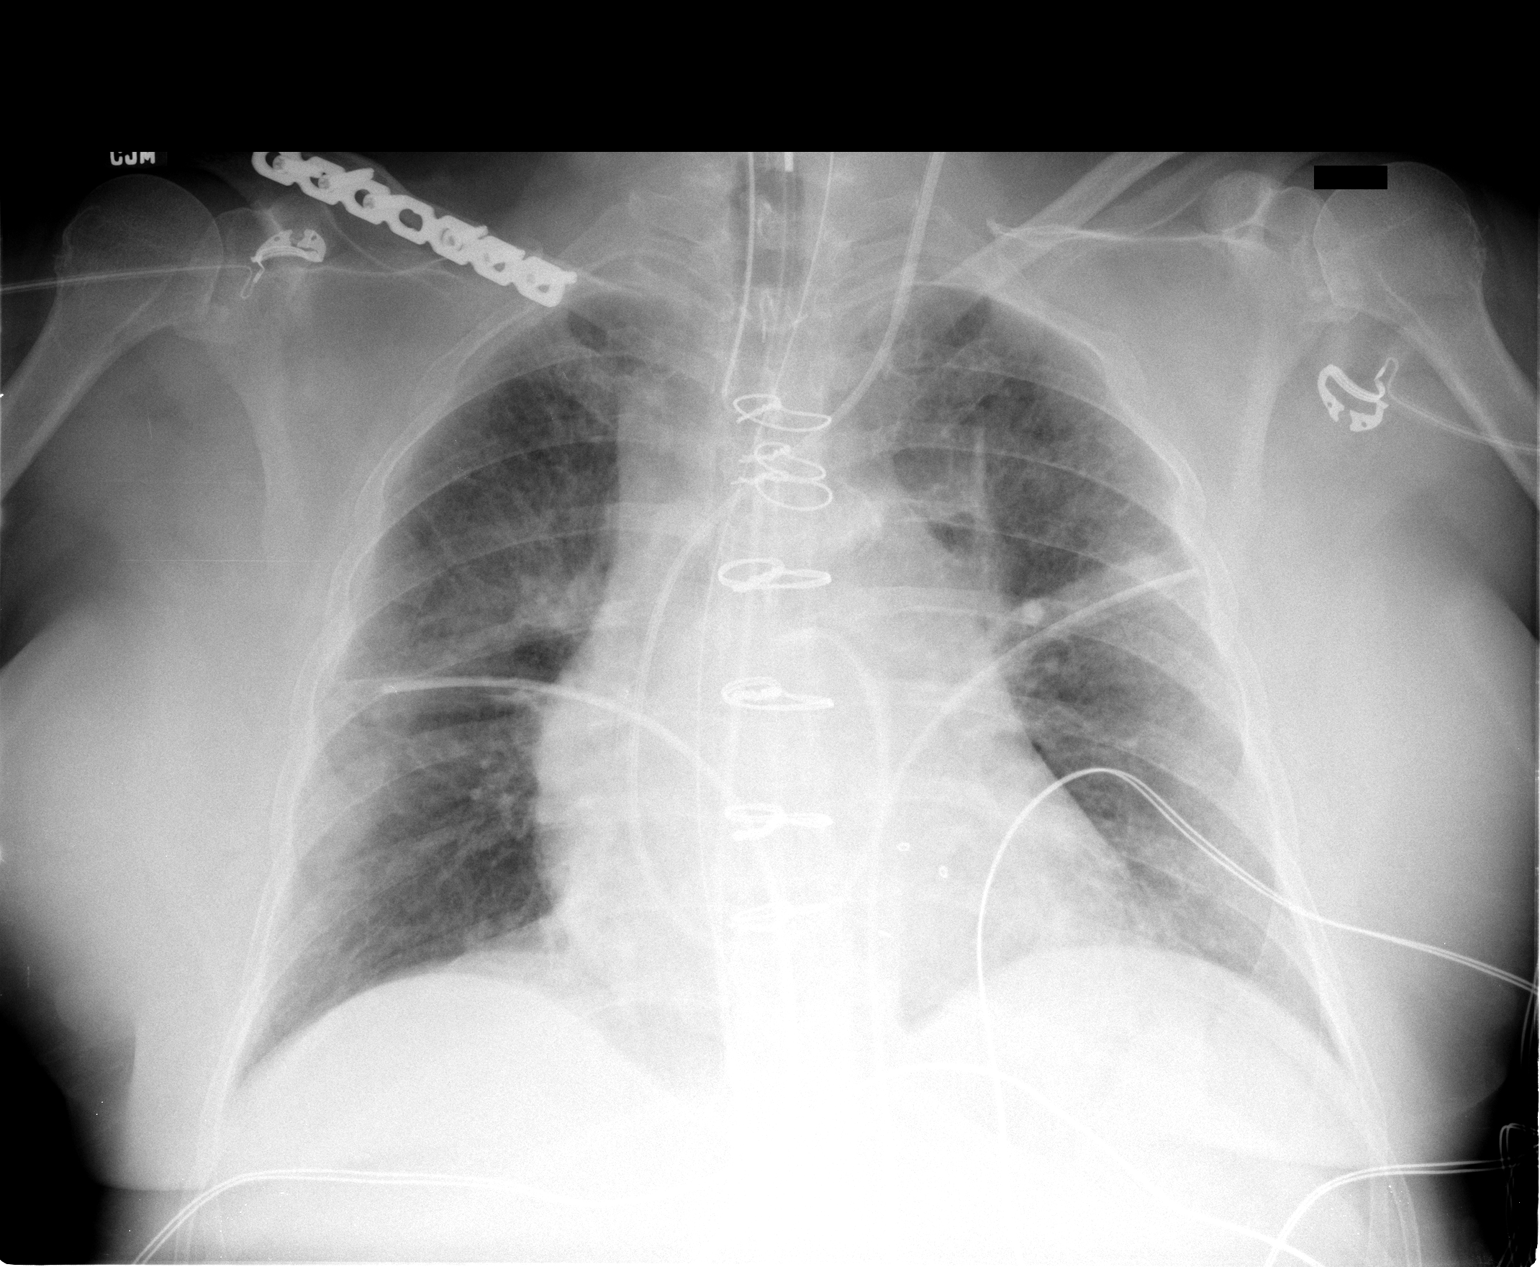

[1 of 1 positions shown; findings below may reference images not displayed]

FINDINGS: The tip of the endotracheal tube is approximately 4.6 cm
above the carina.  Swan-Ganz catheter is present with the tip in
the right main pulmonary artery.  Bilateral chest tubes are noted.
There is evidence of pulmonary vascular congestion.  Mild
cardiomegaly is stable.
IMPRESSION: 1.  Tip of endotracheal tube 4.6 cm above carina.
2.  Bilateral chest tubes.  No pneumothorax.
3.  Mild pulmonary vascular congestion.

## 2013-01-27 ENCOUNTER — Encounter: Payer: Self-pay | Admitting: Internal Medicine

## 2013-01-27 IMAGING — CR DG CHEST 1V PORT
1 series · 1 of 1 positions shown · non-contrast
Comparison: the previous day's study

CLINICAL DATA: Mitral valve surgery

PORTABLE CHEST - 1 VIEW

[view not recorded]
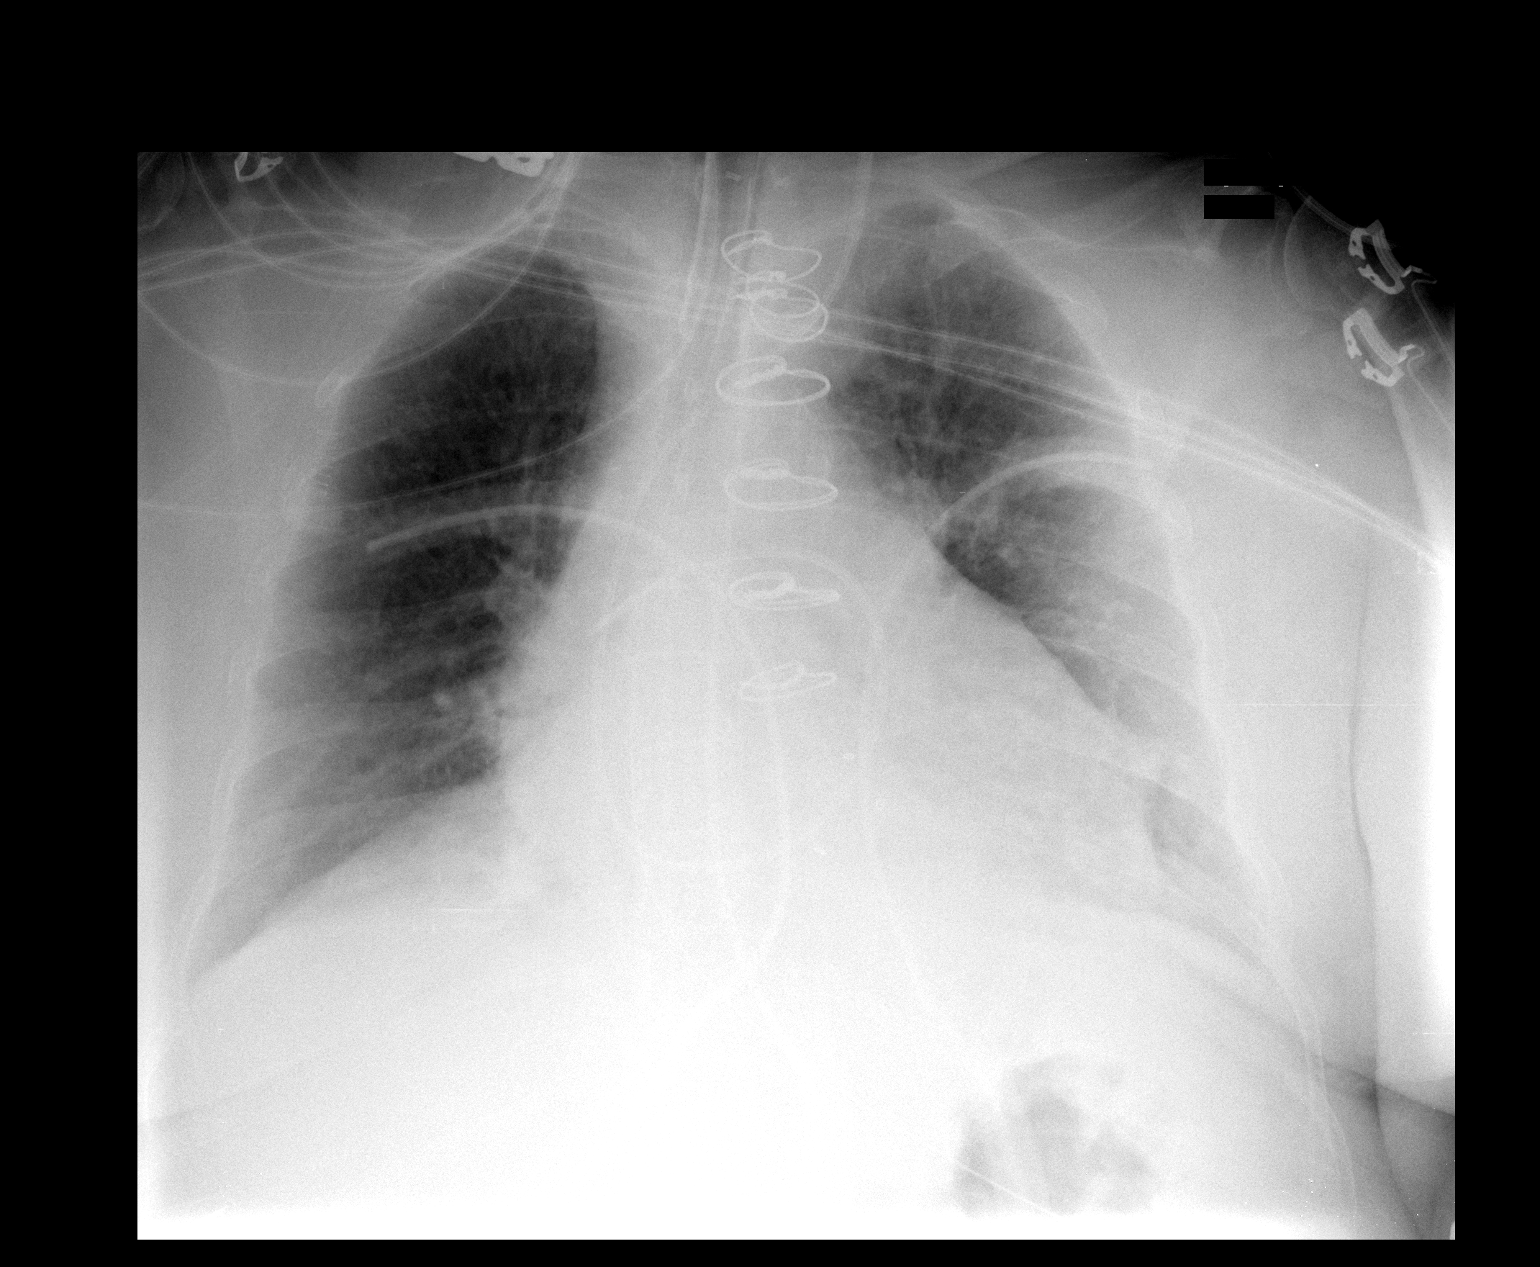

[1 of 1 positions shown; findings below may reference images not displayed]

FINDINGS: Endotracheal tube, nasogastric tube,   and bilateral
chest tubes remain in place. The left IJ Parada has been
advanced into the distal right pulmonary artery.  Mild
cardiomegaly.  Mild interstitial edema.  No definite effusion.
Previous median sternotomy.  Fixation hardware in the right
clavicle as before.
IMPRESSION: 1.  Interval advancement of the the Parada to the distal right
pulmonary artery.  Support hardware otherwise stable in position.
2.  Stable cardiomegaly and mild interstitial edema.

## 2013-01-28 ENCOUNTER — Other Ambulatory Visit: Payer: Self-pay | Admitting: Internal Medicine

## 2013-01-28 IMAGING — CR DG CHEST 1V PORT
1 series · 1 of 1 positions shown · non-contrast
Comparison: 09/21/2010

CLINICAL DATA: Postop MVR

PORTABLE CHEST - 1 VIEW

[AP]
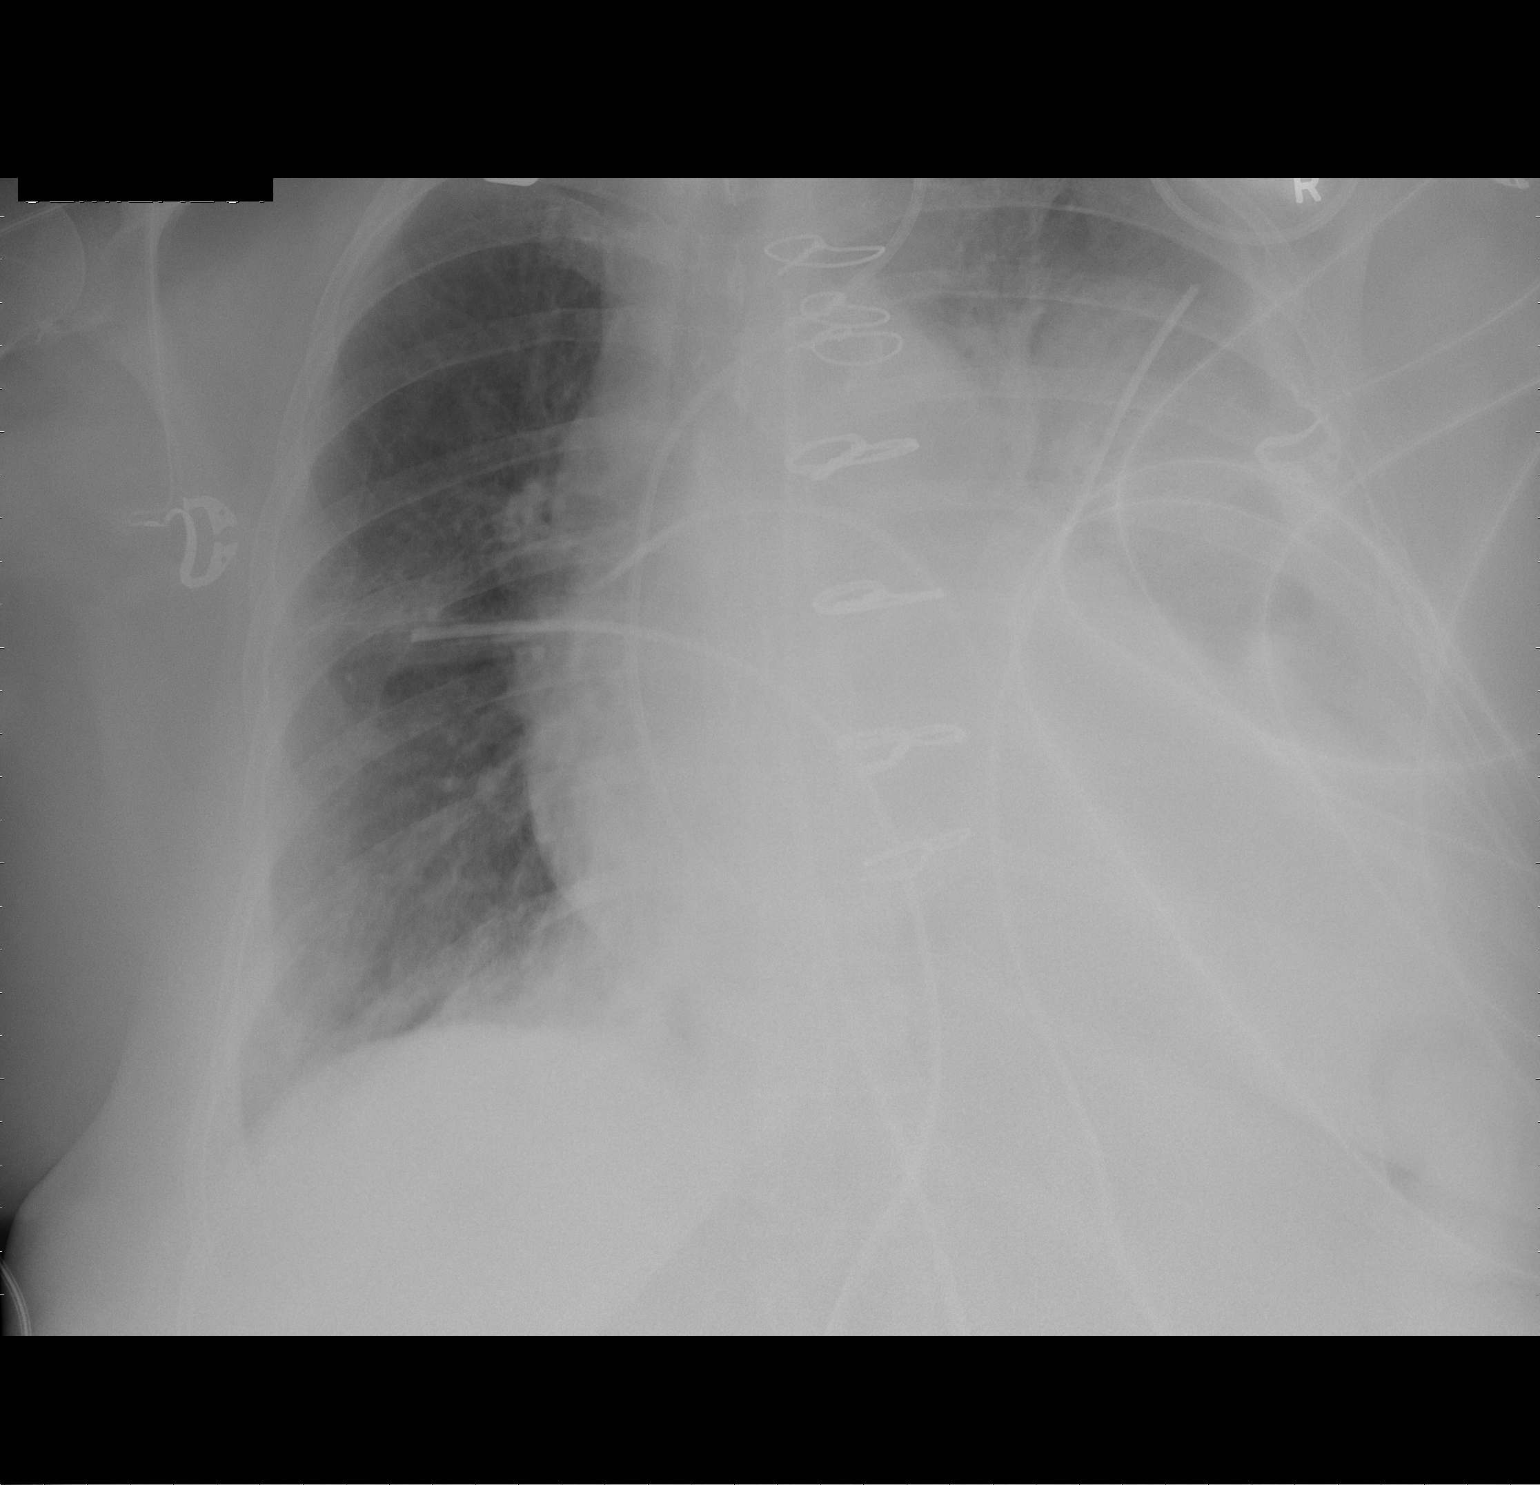

[1 of 1 positions shown; findings below may reference images not displayed]

FINDINGS: Left mid/lower lung opacity, likely a combination of
moderate pleural effusion and lingular/lower lobe atelectasis.
Bilateral chest tubes and mediastinal drain.  No pneumothorax.

Cardiomegaly.  Stable left IJ Swan-Ganz catheter.  Sternotomy
wires.

Interval extubation.
IMPRESSION: Interval extubation.

Bilateral chest tubes/mediastinal drain.  No pneumothorax is seen.

Moderate left pleural effusion with associated lingular/lower lobe
atelectasis.

## 2013-01-28 MED ORDER — POTASSIUM CHLORIDE CRYS ER 20 MEQ PO TBCR: 20.0000 meq | EXTENDED_RELEASE_TABLET | Freq: Two times a day (BID) | ORAL | Status: DC

## 2013-01-29 IMAGING — CR DG CHEST 1V PORT
1 series · 1 of 1 positions shown · non-contrast
Comparison: 09/23/2010

CLINICAL DATA: CABG.  Central line placement

PORTABLE CHEST - 1 VIEW

[AP]
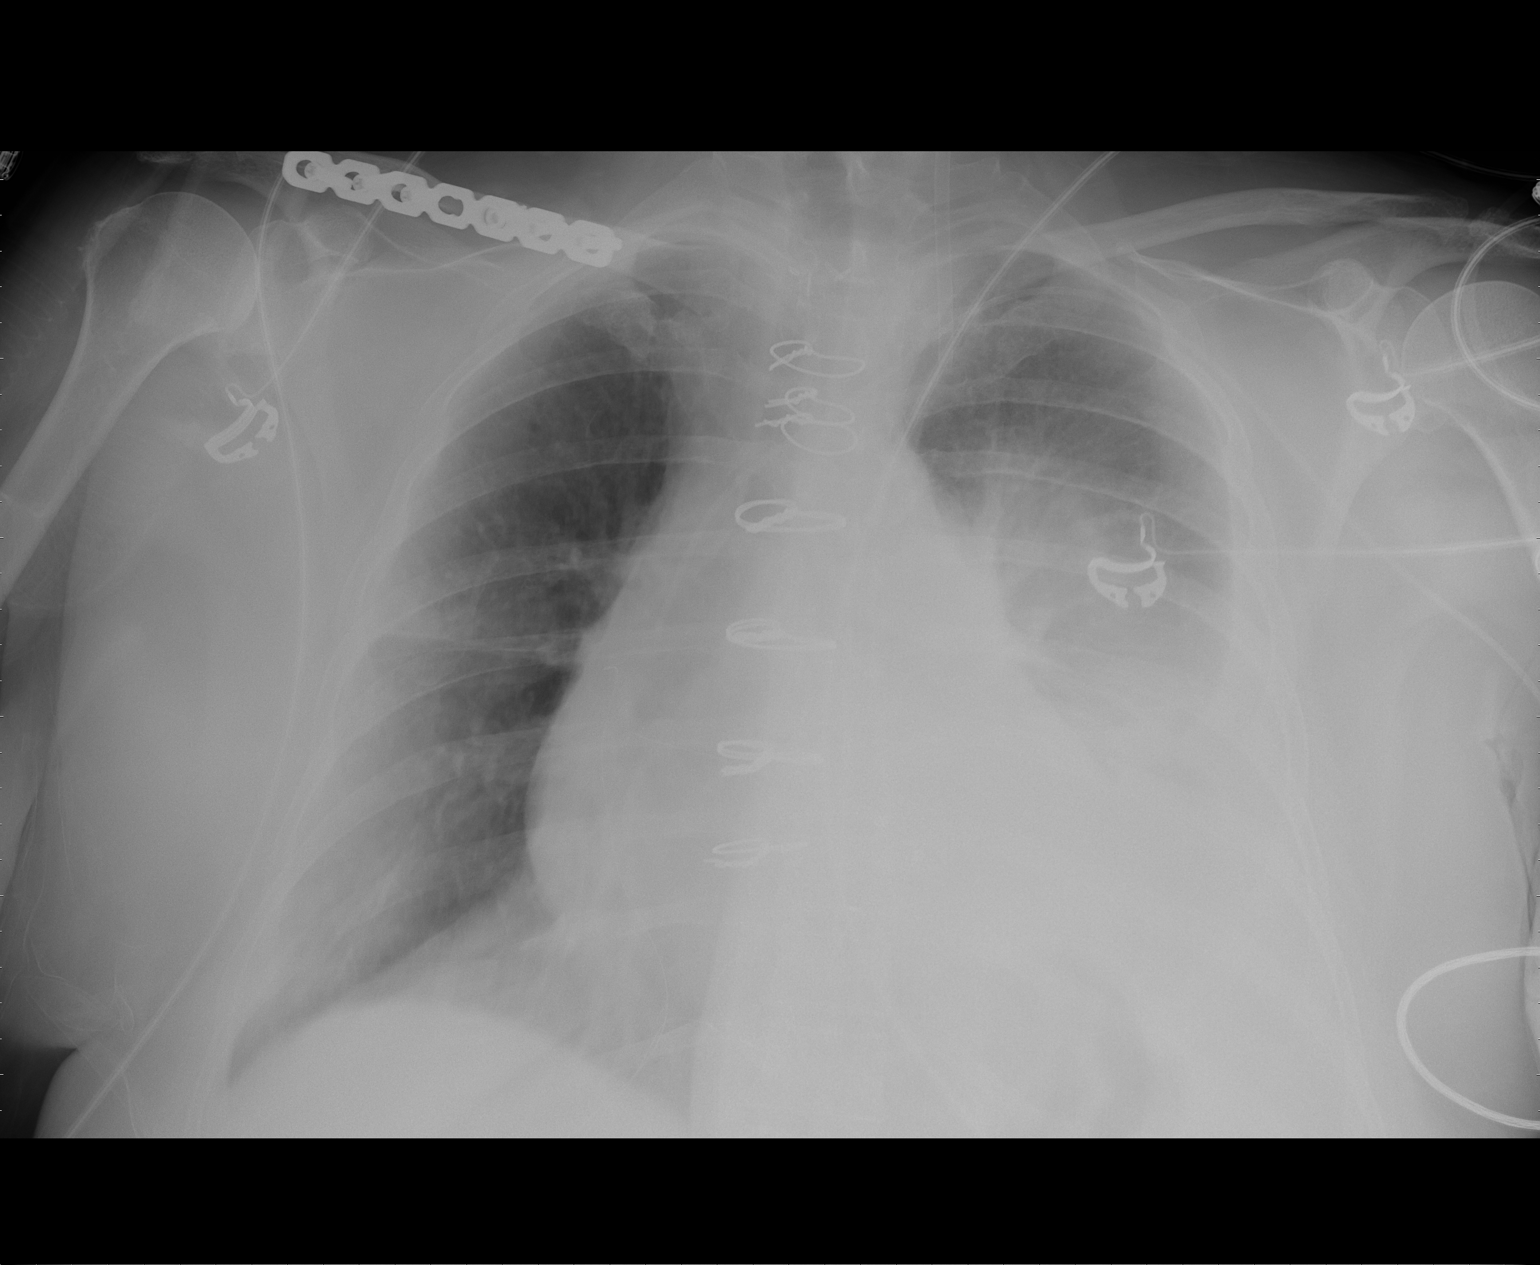

[1 of 1 positions shown; findings below may reference images not displayed]

FINDINGS: Left arm PICC tip in the SVC.  Left jugular sheath in the
left innominate vein.

Moderately large left effusion and left lower lobe atelectasis
unchanged.  Right lung remains clear.  NG tube in the stomach.
IMPRESSION: PICC tip in the SVC.

No change and moderate to large left effusion

## 2013-01-29 IMAGING — CR DG CHEST 1V PORT
1 series · 1 of 1 positions shown · non-contrast
Comparison: 09/22/2010.

CLINICAL DATA: Postop mitral valve replacement.

PORTABLE CHEST - 1 VIEW

[view not recorded]
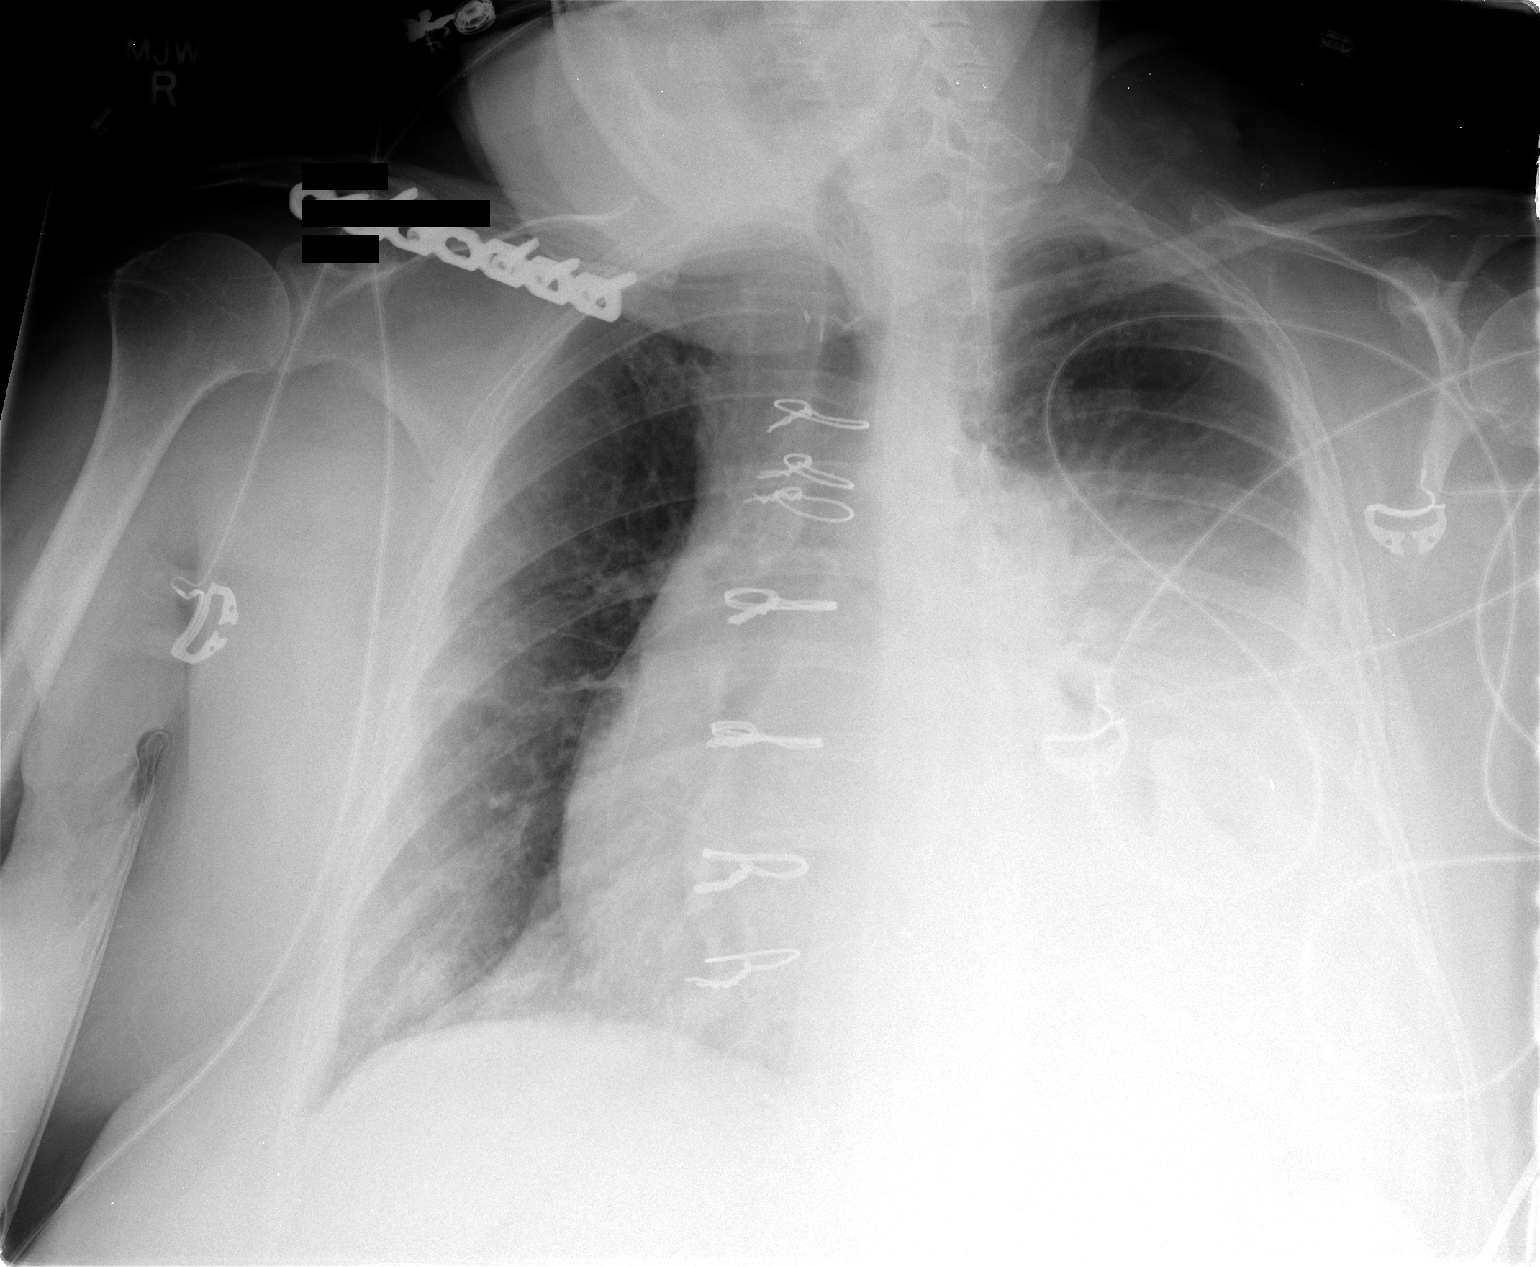

[1 of 1 positions shown; findings below may reference images not displayed]

FINDINGS: Trachea is midline.  Left IJ Swan-Ganz catheter has been
removed, with catheter sheath remaining in place.  Mediastinal
drain and chest tubes have been removed.  Epicardial pacer wires
remain in place.  Heart size is grossly stable.  Large left pleural
effusion with left lung air space disease.  Minimal right basilar
airspace disease and tiny right pleural effusion. Plate and screw
fixation of the right clavicle is incidentally noted.
IMPRESSION: 1.  Large left pleural effusion with left lung air space disease.
Pneumonia is considered.
2.  Minimal right basilar airspace disease and small right pleural
effusion.

## 2013-01-30 IMAGING — CR DG CHEST 1V PORT
1 series · 1 of 1 positions shown · non-contrast
Comparison: the previous day's study

CLINICAL DATA: Coronary bypass grafting

PORTABLE CHEST - 1 VIEW

[view not recorded]
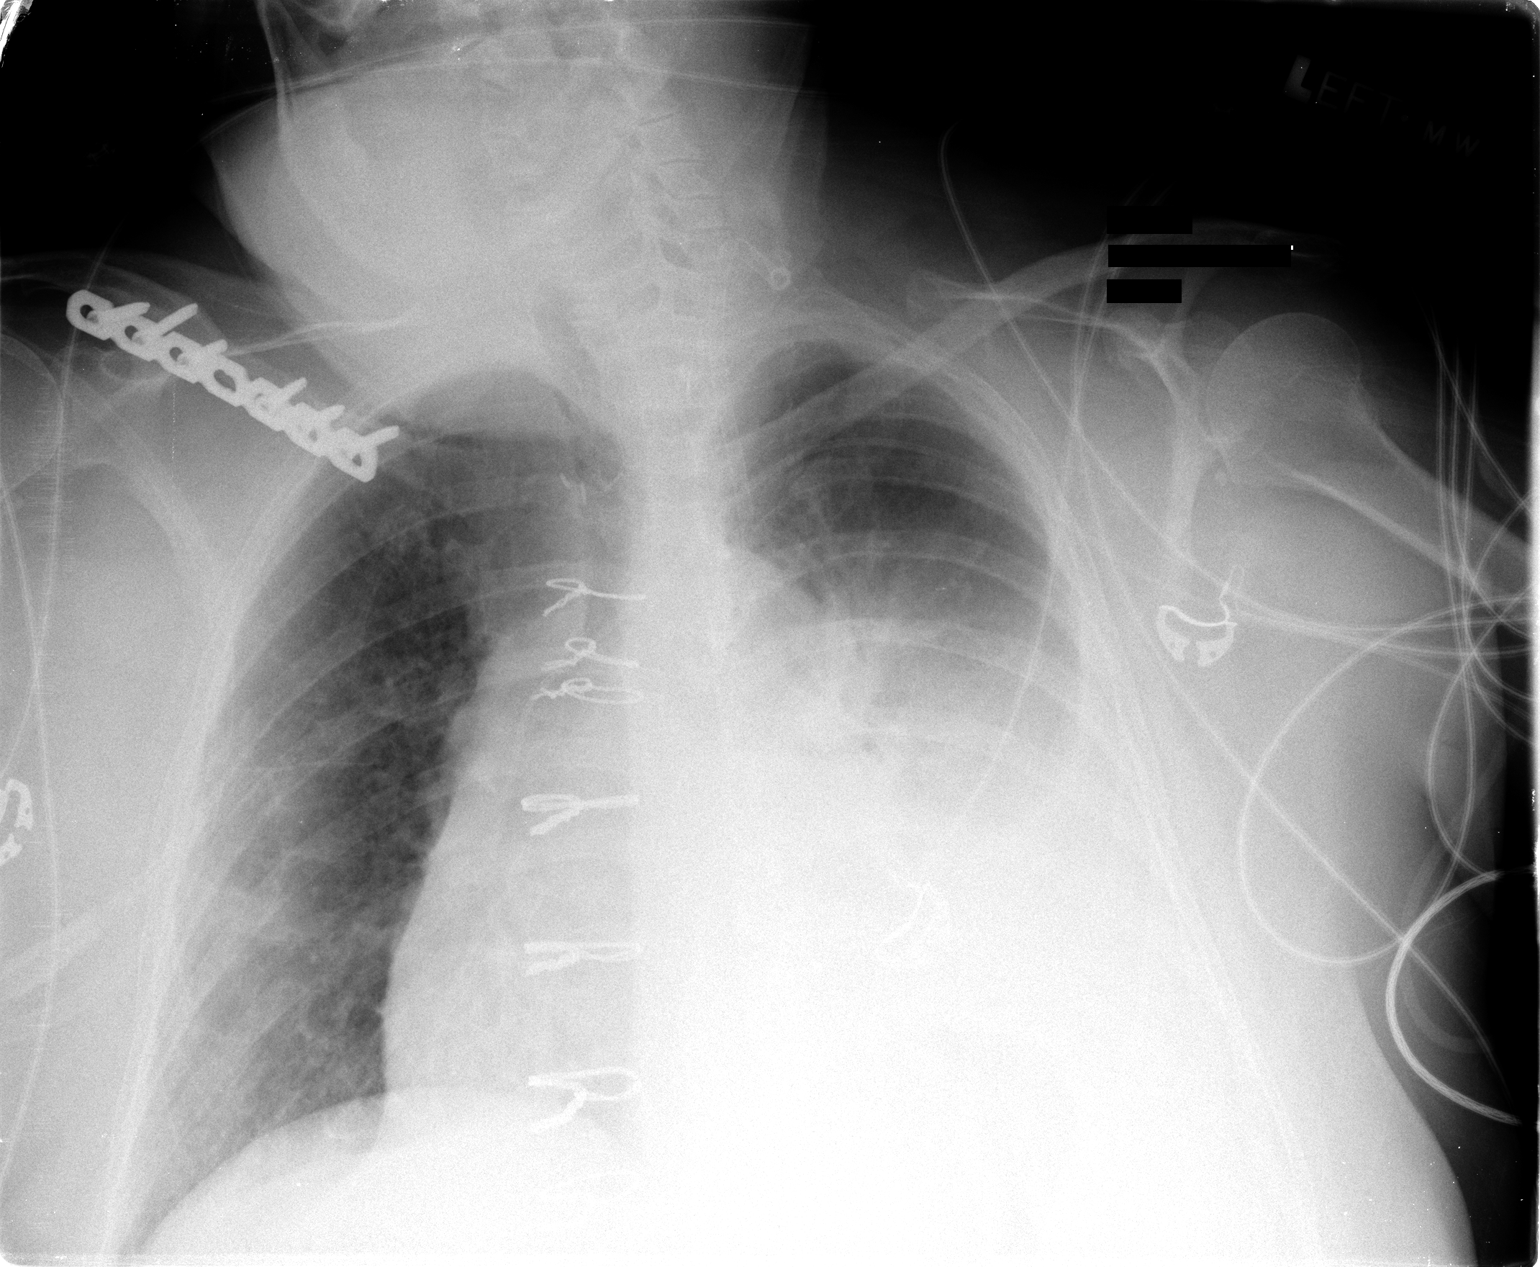

[1 of 1 positions shown; findings below may reference images not displayed]

FINDINGS: Moderately large left pleural effusion with adjacent
atelectasis/consolidation in the mid and lower left lung as before.
Previous median sternotomy.  Left arm PICC stable.  Fixation
hardware in the right clavicle.
IMPRESSION: 1.  Moderately large left pleural effusion.  Little change from
previous day's portable exam.

## 2013-01-30 IMAGING — CR DG CHEST 1V PORT
1 series · 1 of 1 positions shown · non-contrast
Comparison: 09/24/2010 at 3830 hours

CLINICAL DATA: Shortness of breath, postop

PORTABLE CHEST - 1 VIEW

[view not recorded]
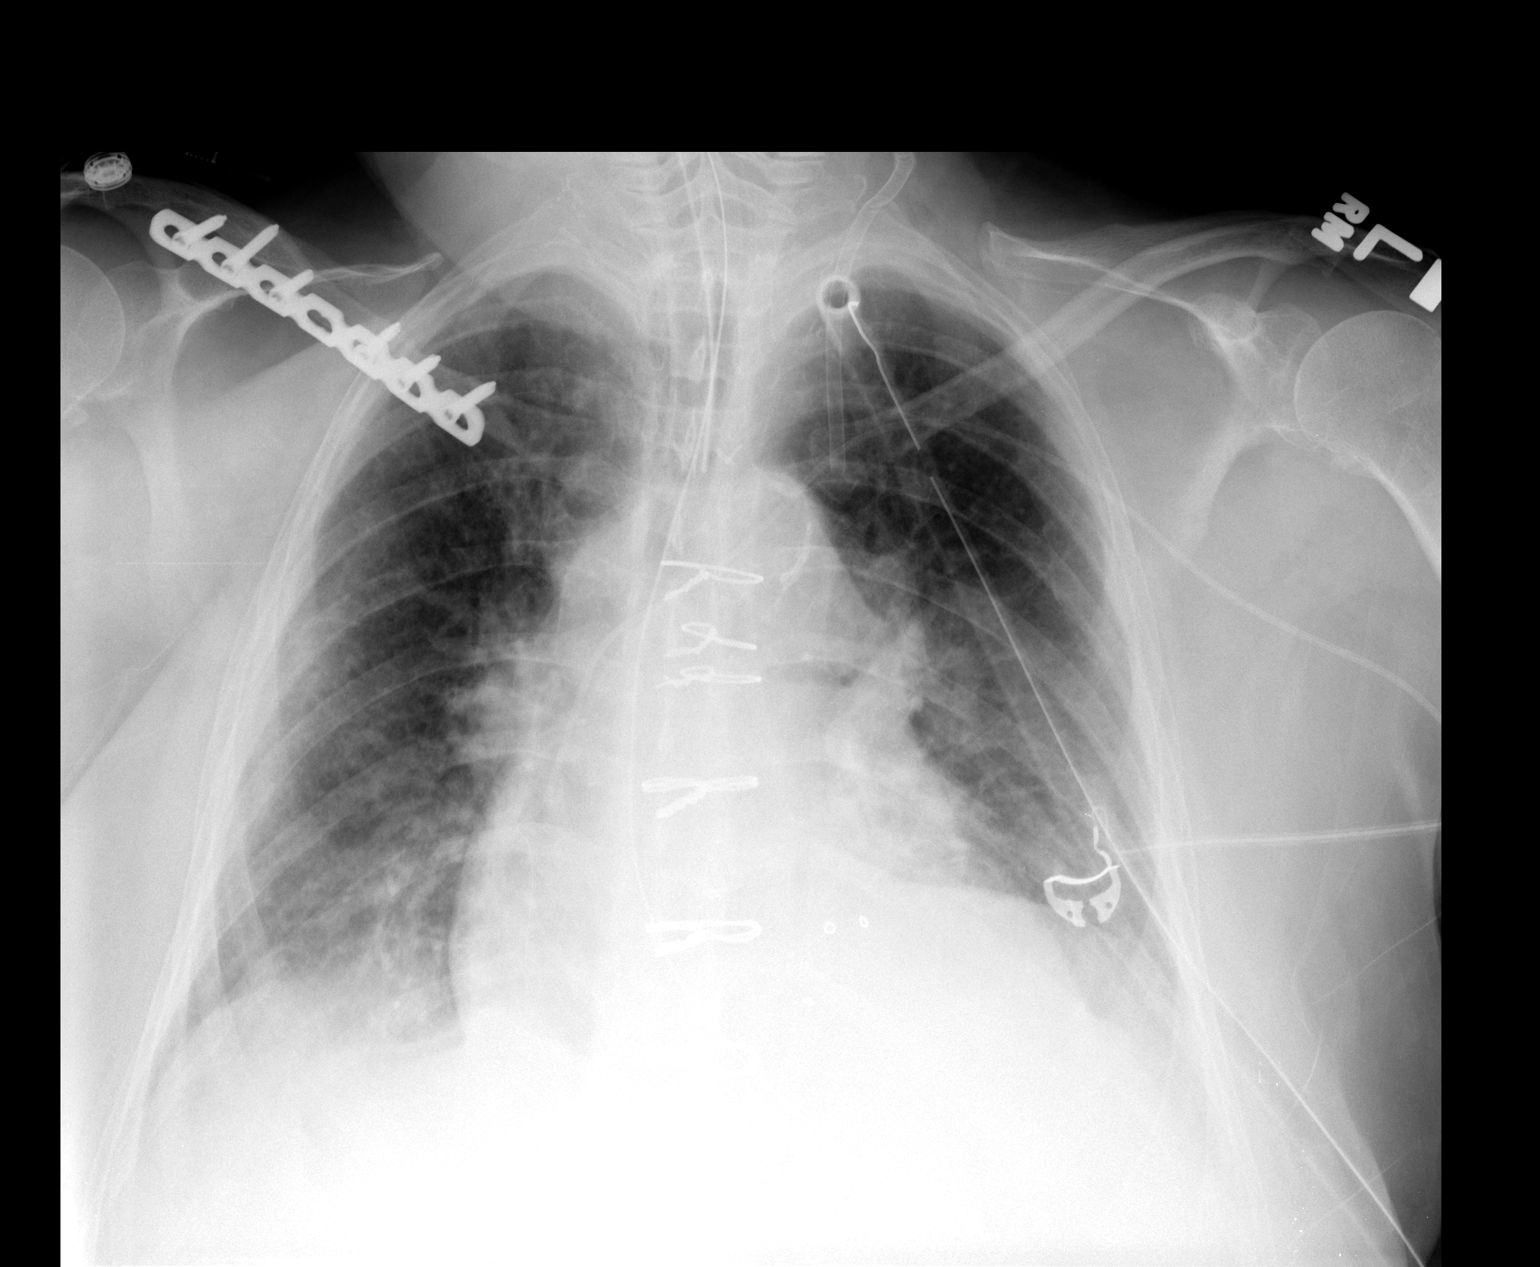

[1 of 1 positions shown; findings below may reference images not displayed]

FINDINGS: Endotracheal tube 4.5 cm above the carina.

Enteric tube coursing below the diaphragm. Stable left subclavian
PICC.  Stable left neck venous sheath.

Interval placement of a left apical chest tube.  No pneumothorax is
seen.

Cardiomegaly with mild interstitial edema.  Small bilateral pleural
effusions.
IMPRESSION: Endotracheal tube 4.5 cm above the carina.

Interval placement of a left apical chest tube.  No pneumothorax is
seen.

Cardiomegaly with mild interstitial edema and small bilateral
pleural effusions.

## 2013-01-31 IMAGING — CR DG CHEST 1V PORT
1 series · 1 of 1 positions shown · non-contrast
Comparison: Chest 09/24/2010.

CLINICAL DATA: Status post CABG.

PORTABLE CHEST - 1 VIEW

[AP]
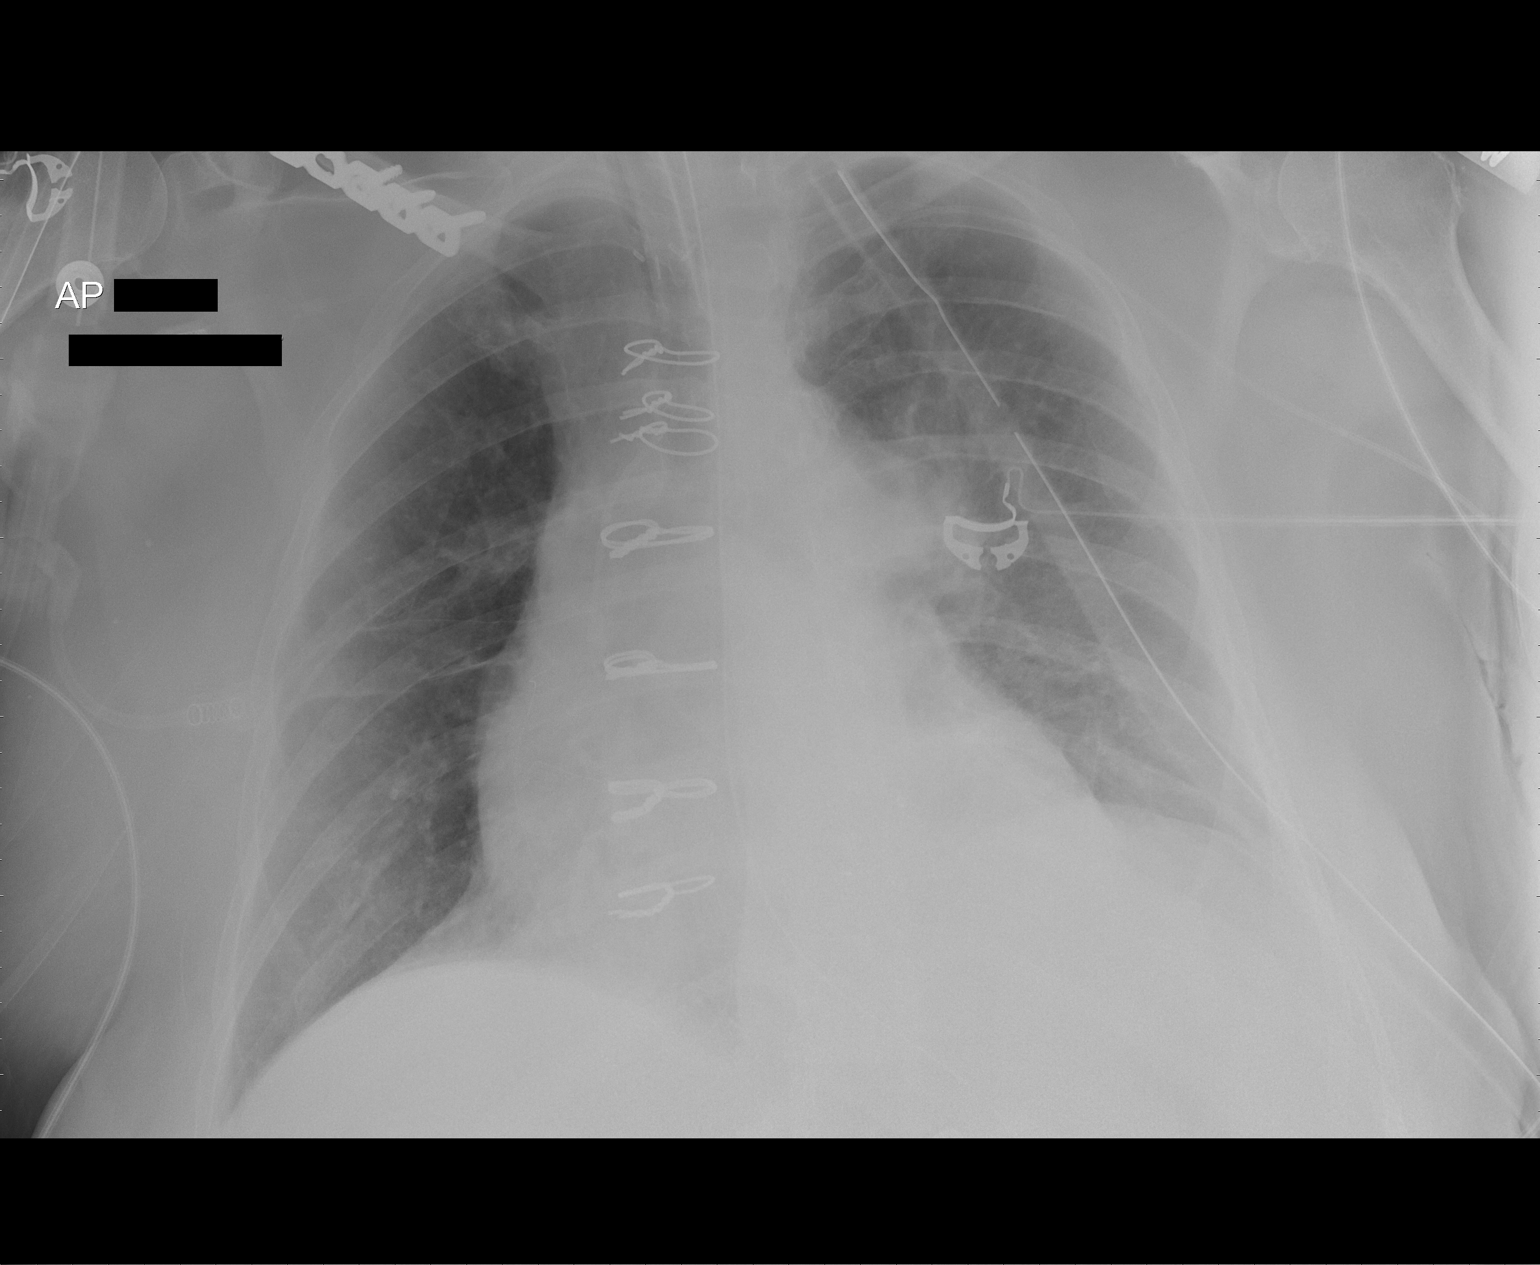

[1 of 1 positions shown; findings below may reference images not displayed]

FINDINGS: Support tubes and lines are unchanged.  There is no
pneumothorax on the left chest tube in place.  Bibasilar
atelectasis has improved.  There is cardiomegaly but no pulmonary
edema.
IMPRESSION: 1.  Support apparatus in good position.  No pneumothorax.
2.  Improved right basilar atelectasis.

## 2013-02-01 IMAGING — CR DG CHEST 1V PORT
1 series · 1 of 1 positions shown · non-contrast
Comparison: Chest x-ray 09/25/2010.

CLINICAL DATA: Bypass surgery.

PORTABLE CHEST - 1 VIEW

[view not recorded]
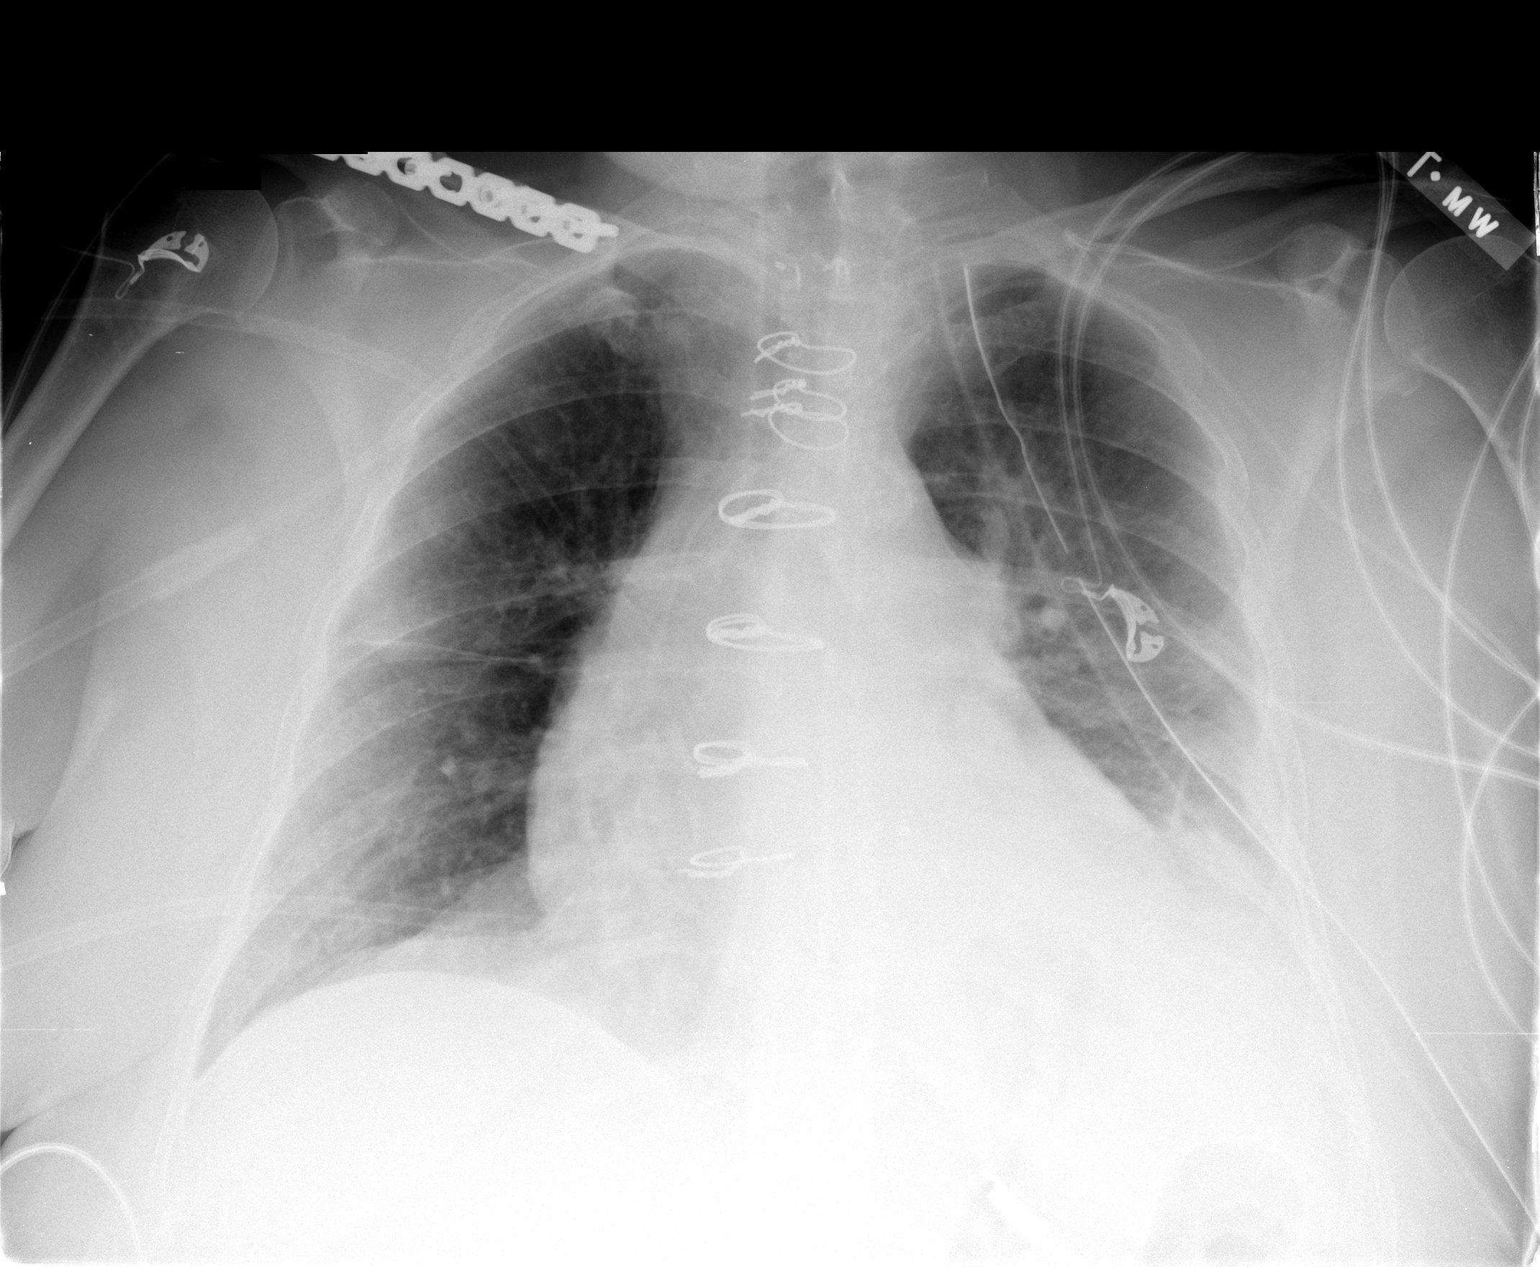

[1 of 1 positions shown; findings below may reference images not displayed]

FINDINGS: The endotracheal tube and NG tubes have been removed.
The left PICC line is stable and the left chest tube is stable.  No
definite pneumothorax.  Slight increase and left lower lobe
atelectasis post extubation.  Mild vascular congestion but no
edema.
IMPRESSION: 1.  Removal of ET and NG tubes.
2.  Stable left-sided chest tube without pneumothorax.
3.  Slight increase and left basilar atelectasis post extubation.

## 2013-02-02 IMAGING — CR DG CHEST 1V PORT
1 series · 1 of 1 positions shown · non-contrast
Comparison: Portable chest x-rays yesterday and dating back to
09/23/2010.

CLINICAL DATA: Postop CABG.

PORTABLE CHEST - 1 VIEW [DATE]/[id number] hours:

[AP]
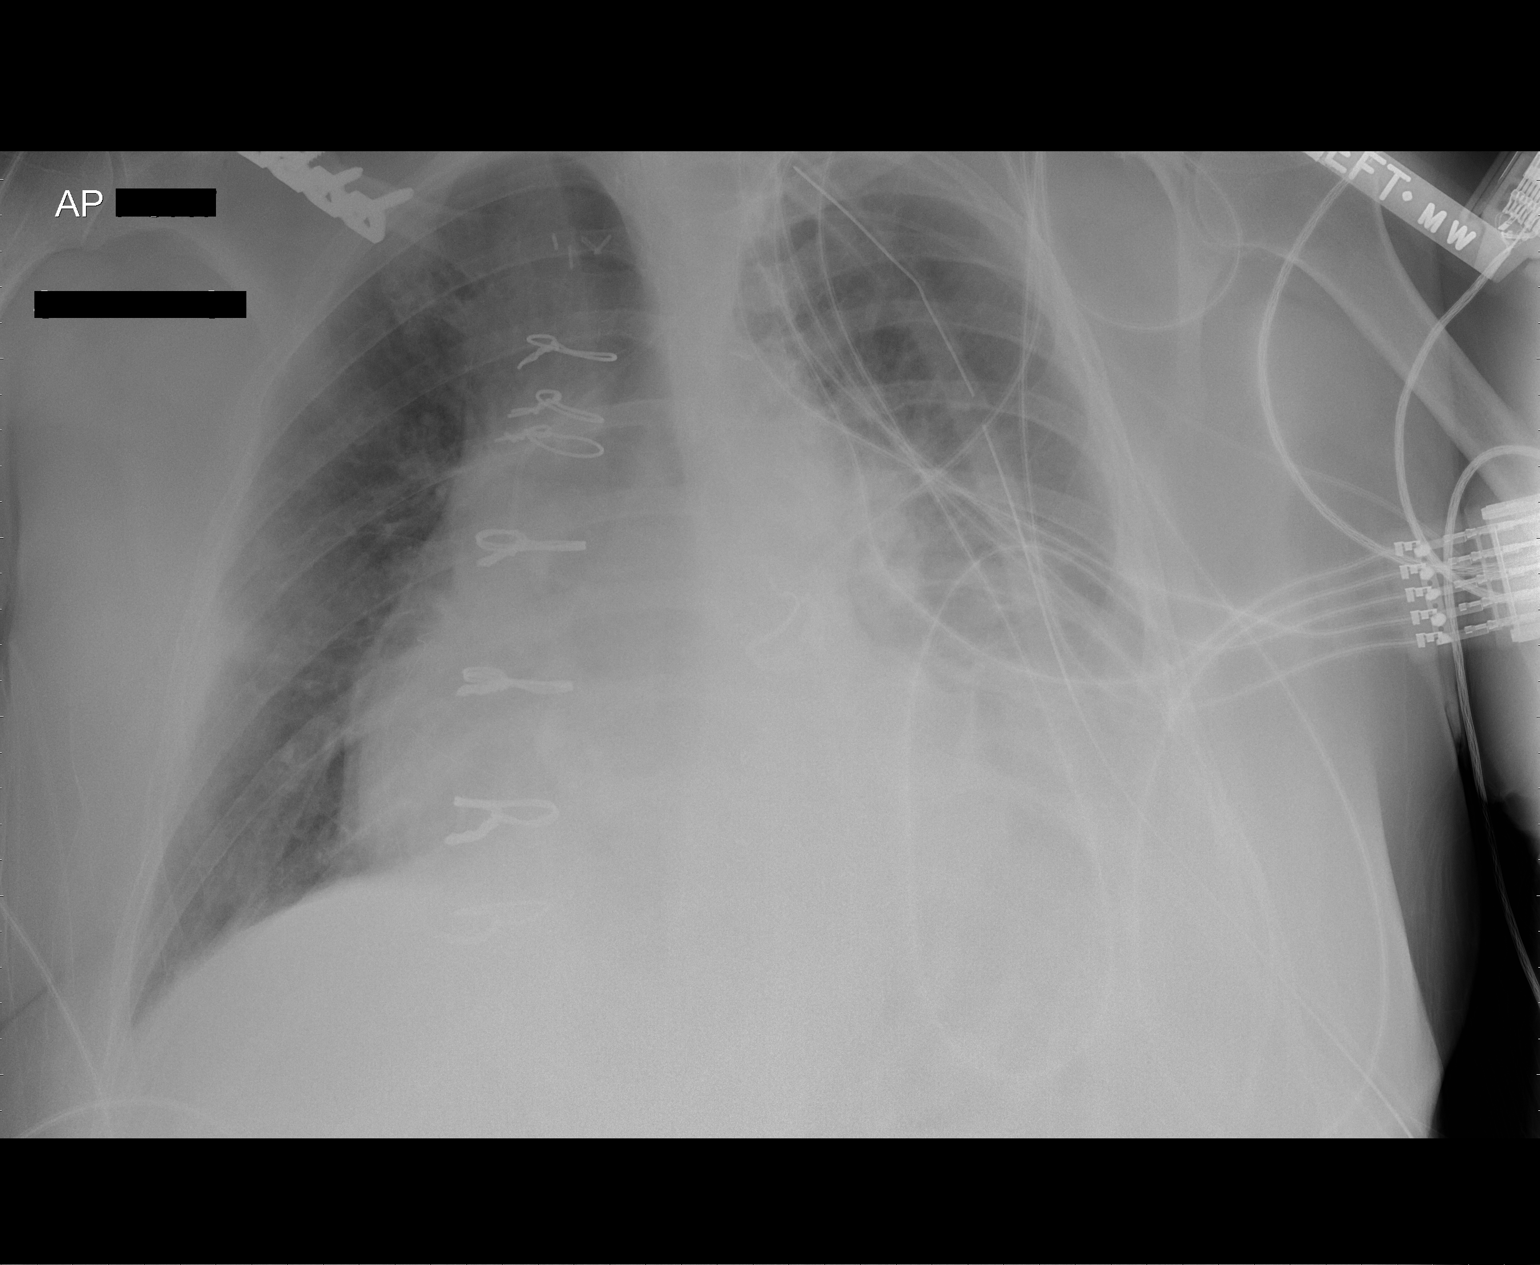

[1 of 1 positions shown; findings below may reference images not displayed]

FINDINGS: Patient rotated to the right.  Sternotomy for CABG.
Cardiac silhouette enlarged but stable.  Mild pulmonary venous
hypertension without overt edema.  Stable dense consolidation in
the left lower lobe.  Left chest tube remains in place with no
pneumothorax.  Left arm PICC tip remains in the SVC.  Prior ORIF of
a right clavicle fracture with healing.
IMPRESSION: Support apparatus satisfactory.  No pneumothorax.  Stable dense
left lower lobe atelectasis.  No new abnormality.

## 2013-02-03 IMAGING — CR DG CHEST 1V PORT
1 series · 1 of 1 positions shown · non-contrast
Comparison: Yesterday

CLINICAL DATA: CABG

PORTABLE CHEST - 1 VIEW

[AP]
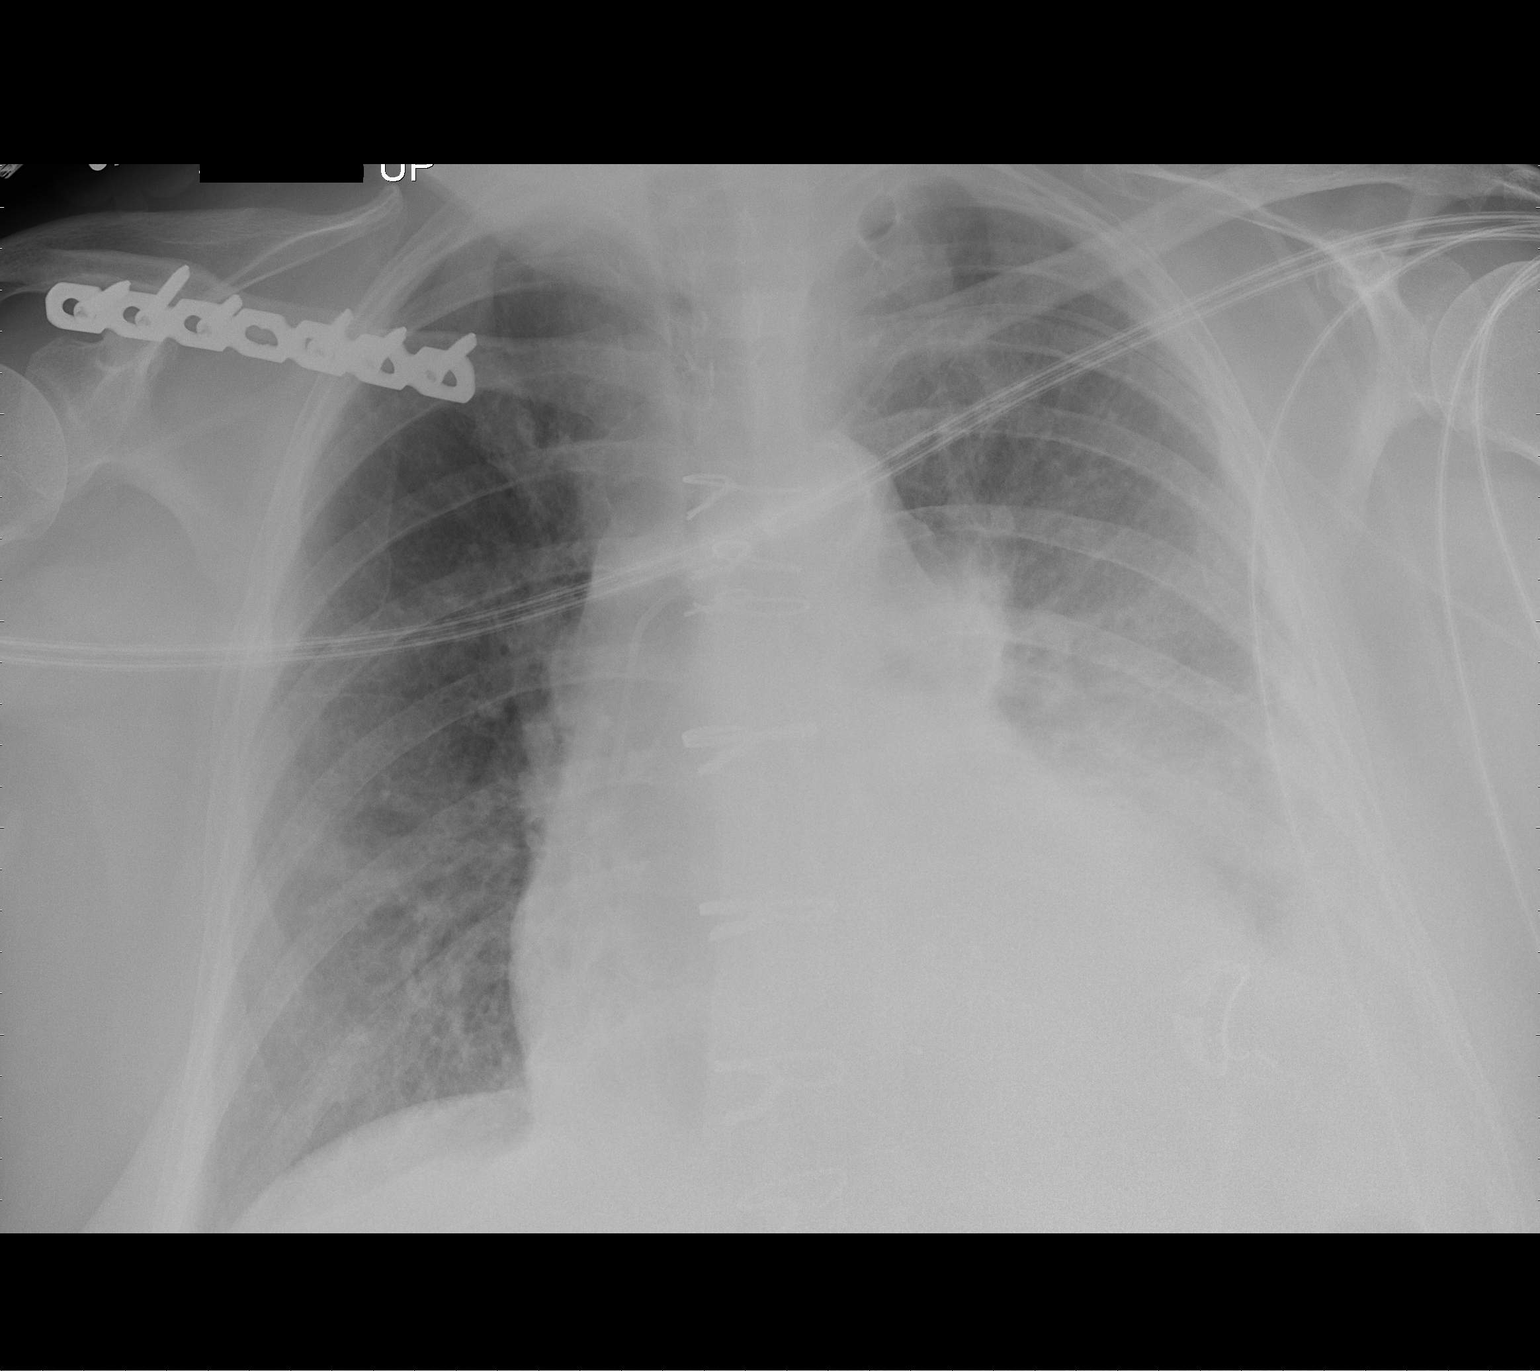

[1 of 1 positions shown; findings below may reference images not displayed]

FINDINGS: Stable cardiomegaly.  Stable left perihilar and lower
lobe consolidation.  Stable left effusion.  Right lung clear.  No
pneumothorax. Stable left PICC.
IMPRESSION: Stable.  No pneumothorax.  Left basilar consolidation and left
pleural effusion persist.

## 2013-02-04 ENCOUNTER — Other Ambulatory Visit: Payer: Self-pay | Admitting: Internal Medicine

## 2013-02-04 IMAGING — CR DG CHEST 2V
2 series · 2 of 2 positions shown · non-contrast
Comparison: Yesterday

CLINICAL DATA: Mitral valve replacement

CHEST - 2 VIEW

[w chest pa]
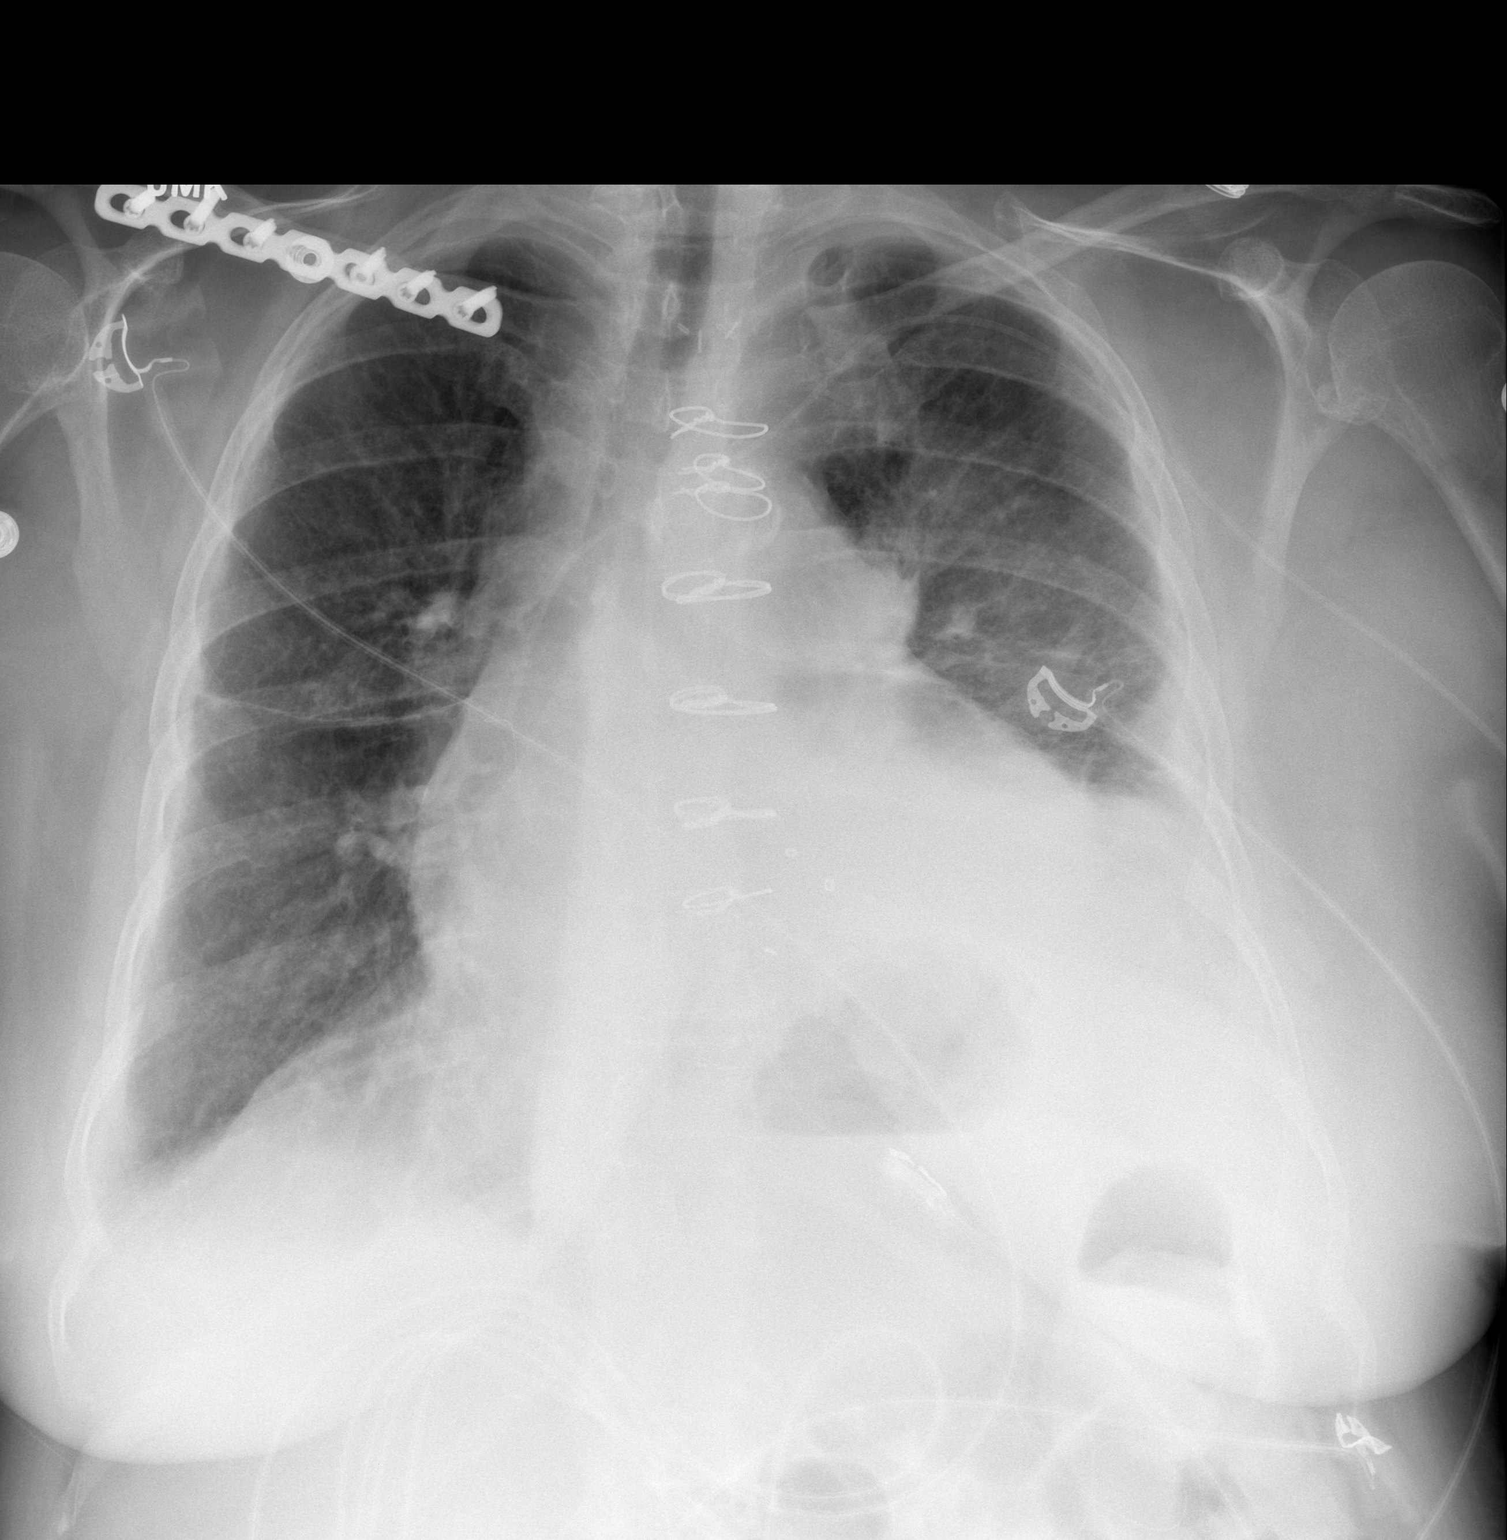

[w chest lat]
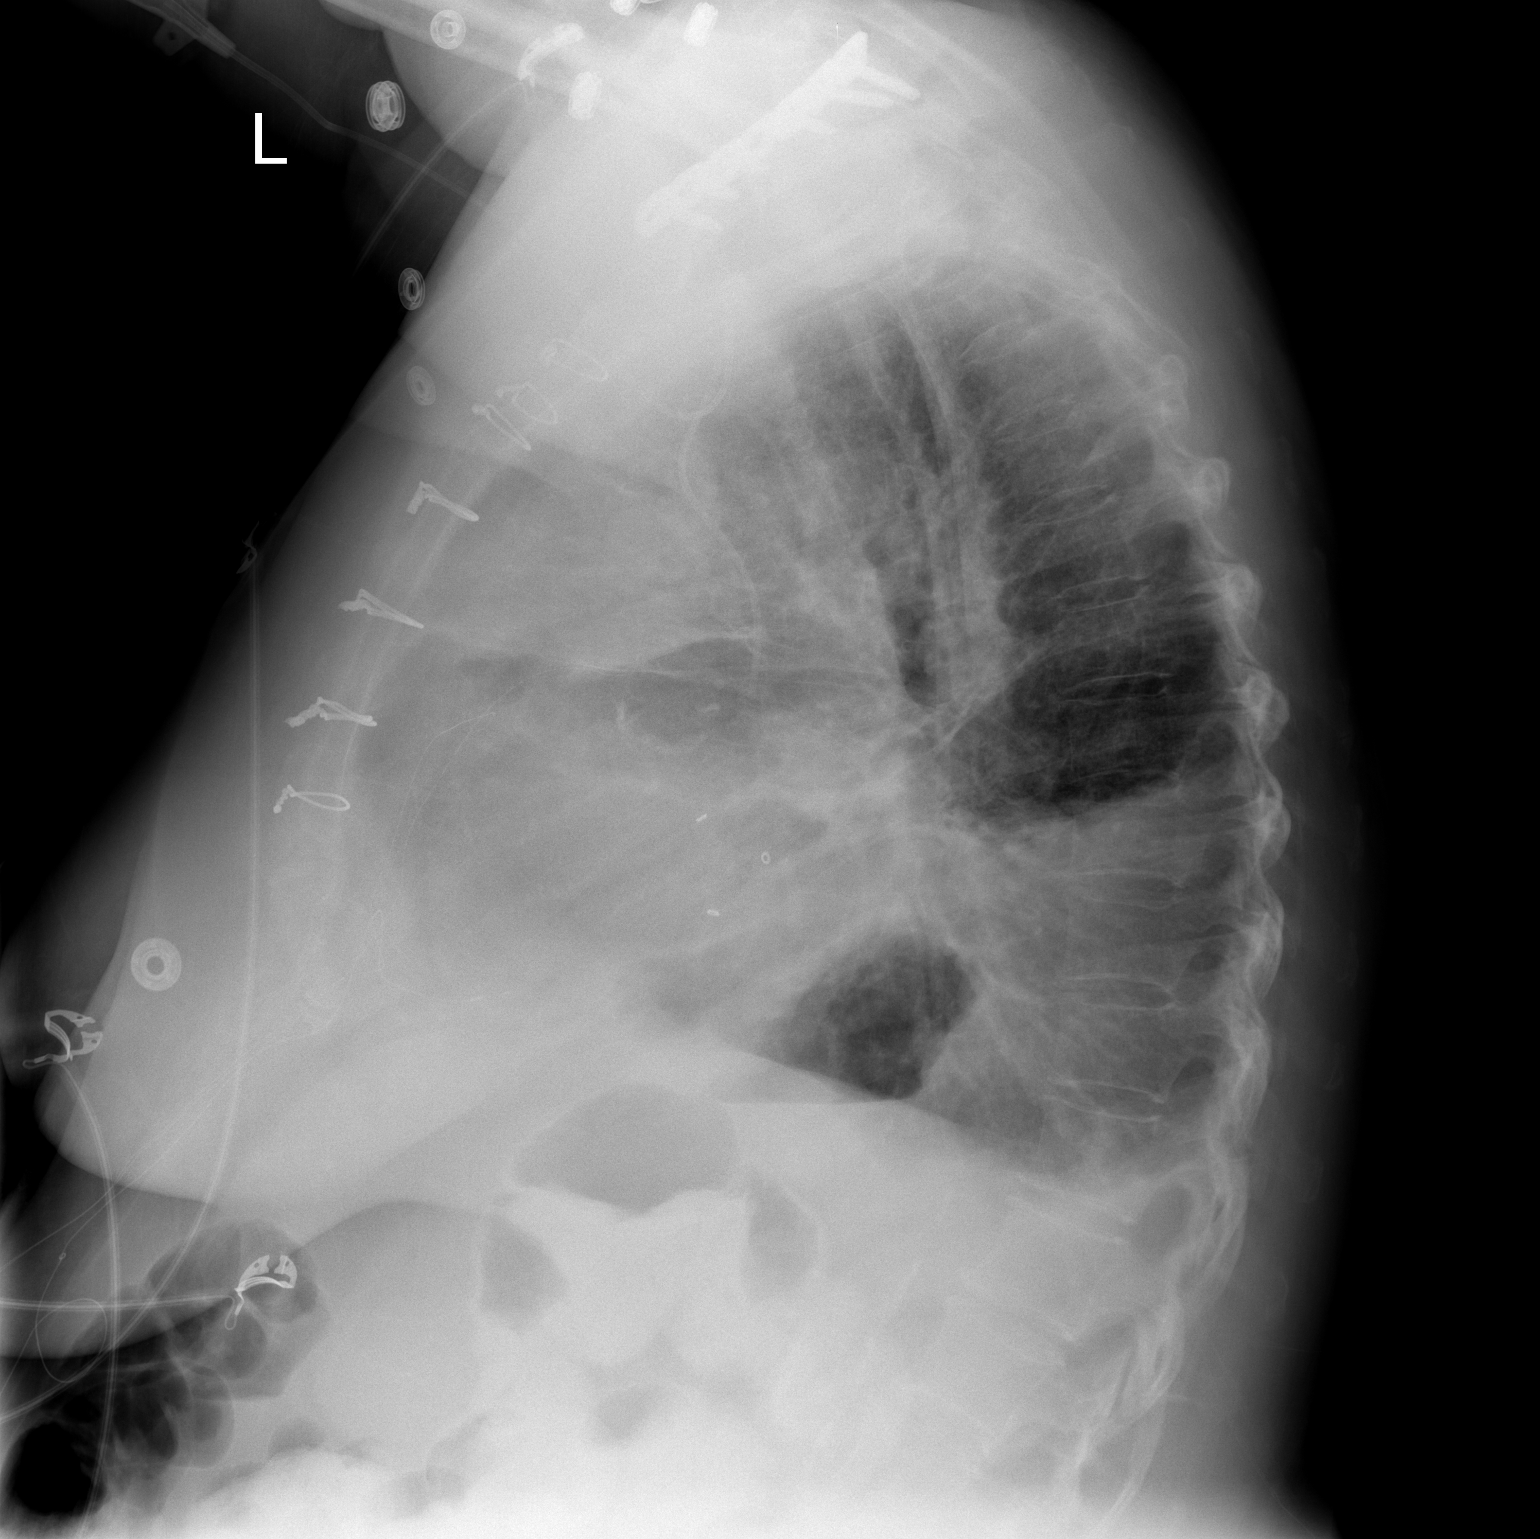

[2 of 2 positions shown; findings below may reference images not displayed]

FINDINGS: Mild cardiomegaly.  Left basilar consolidation and
pleural effusion stable.  Left PICC stable.  Vascular congestion is
stable.  No Kerley B lines.  Increasing right pleural effusion.  No
pneumothorax.
IMPRESSION: Stable left effusion and basilar consolidation.  Small right
pleural effusion is now visualized in the upright position.

No definite pneumothorax.

## 2013-02-09 ENCOUNTER — Ambulatory Visit (INDEPENDENT_AMBULATORY_CARE_PROVIDER_SITE_OTHER): Payer: Medicare Other | Admitting: Pharmacist

## 2013-02-09 DIAGNOSIS — I4891 Unspecified atrial fibrillation: Secondary | ICD-10-CM

## 2013-02-09 DIAGNOSIS — I480 Paroxysmal atrial fibrillation: Secondary | ICD-10-CM

## 2013-02-09 DIAGNOSIS — Z7901 Long term (current) use of anticoagulants: Secondary | ICD-10-CM

## 2013-02-09 DIAGNOSIS — D7389 Other diseases of spleen: Secondary | ICD-10-CM

## 2013-02-09 DIAGNOSIS — D735 Infarction of spleen: Secondary | ICD-10-CM

## 2013-02-09 LAB — POCT INR: INR: 2.7

## 2013-02-13 ENCOUNTER — Encounter: Payer: Self-pay | Admitting: Cardiology

## 2013-02-13 ENCOUNTER — Encounter: Payer: Self-pay | Admitting: Internal Medicine

## 2013-02-14 ENCOUNTER — Telehealth: Payer: Self-pay | Admitting: *Deleted

## 2013-02-14 ENCOUNTER — Telehealth: Payer: Self-pay | Admitting: Cardiology

## 2013-02-14 NOTE — Telephone Encounter (Signed)
Appt made for Coumadin check per Dr Elie Confer 03/14/13 -  Changed insurance and co pay will be $50.00 with [facility]. Appt made. Hilda Blades Ditzler RN 02/14/13 3:30PM

## 2013-02-14 NOTE — Telephone Encounter (Signed)
Spoke with pt, it will cost her 50 dollars to have her protime checked in this office. She can have it checked with dr Jerrell Belfast for less. She will have her coumadin checked there. Will make the [facility] clinic aware

## 2013-02-14 NOTE — Progress Notes (Signed)
Late entry for corrected G code   January 28, 2013 1600  PT G-Codes **NOT FOR INPATIENT CLASS**  Functional Assessment Tool Used Clinical judgement  Functional Limitation Mobility: Walking and moving around  Mobility: Walking and Moving Around Goal Status [phone]) CH  Mobility: Walking and Moving Around Discharge Status [phone]) [name], Philip

## 2013-02-14 NOTE — Telephone Encounter (Signed)
New message     Pt request [name] call her.   She would not tell me what she wanted.

## 2013-02-21 ENCOUNTER — Ambulatory Visit: Payer: Medicare Other | Admitting: Cardiology

## 2013-02-27 ENCOUNTER — Telehealth: Payer: Self-pay | Admitting: Internal Medicine

## 2013-02-27 NOTE — Telephone Encounter (Addendum)
Reason for call:   I received a call from Ms. Umaima A Waring at 736 AM indicating that she accidentally took 36 units of novolog this morning instead of Levemir 36 units.  Her blood sugar was 228 this morning.  Since then she has eaten several mini reese's pieces cups ~41 ?units carbs she says.  She has no other sweet items in the house she claims other than 2 bananas and does have carbs like mac and cheese and rice at home.      Pertinent Data:   Recheck of blood sugar around 8am was 281, but patient feeling nervous and shaking (possibly secondary to anxiety due to taking too much insulin as well)  Encouraged her to eat both bananas at this time and continue to eat carbs  She has no glucose tablets at home or juice or other candy and is home alone with no one around. She is anxious and rightfully so worried that her sugar will bottom out  She does endorse her sugars running high lately and that she has increased her novolog from 15 units TID to 18 units herself and continues to take levemir 36 units bid  Last HbA1C 6.7 11/2012   Per last visit with PCP: noted to have symptoms of hypoglycemia (01/14/13) and novolog was decreased to 15 units with no futher symptomatic hypoglycemic events  Weight approximately 185lb's    Assessment / Plan / Recommendations:   [name] is a 63 year old obese female with PMH DM2, tobacco abuse, PAF, OSA, CHF, and COPD who has accidentally taken 36 units of novolog instead of her prescribed 15 units this morning.  She is feeling nervous and shaky but CBG up to 281 from 228 this morning after approximately 30 minutes of time novolog was given.  Novolog should start working within 15 minutes and peak around 45-60 minutes, thus her sugar will likely drop, but she continues to eat high glucose/carbohydrate food that she has at home in the meantime.  So far, she has consumed several chocolate pieces and 2 bananas. She has no one at home and is worried incase her  sugar drops further and also has no glucose tablets or juice in the house. She will also eat some macaroni and cheese and may also have some cooked rice at home she says.  Given that she is feeling very anxious and has no one to watch her at home, I have recommended for her to call the ambulance who can be with her at the house, monitor the glucose and treat further as necessary as she does not wish to go to the emergency room or urgent care. It is also likely not very safe for her to drive in this condition.  She says she could call her sister, but she [place] on the other side of town, and that she does not have any neighbors that could help in this situation.  It is likely that if she keeps up her po intake in the next few hours, she will not drop her sugars too much, however, she is recommended to continue to check her blood sugar every 25-30 minutes for at least another 2 hours and I will also try to keep calling over the next few hours to monitor her progress in the event that she does not call the ambulance, or has no one with her, as she was hesitant to do so.   [name] voices understanding and is able to relay the instructions back  to me over the phone and will try to remain close to the phone and keep eating as she can tolerate she says.   As always, pt is advised that if symptoms worsen or new symptoms arise, they should go to an urgent care facility or to to ER for further evaluation.   Jerene Pitch, MD   02/27/2013, 7:54 AM  Addendum: I called [name] back a few times throughout the day to check on her blood sugars.  She reports feeling better and improved CBG's.  CBG trend since our last phone call: 215, 215, 237, 250 (9:10AM) and at 940am cbg of 417.  She did have the ambulance come by her home who remained with her while her sugars went up and she felt safe.  At 1045am her CBG improved to 237 and at 1125am it was 214 and finally at 130pm it was at 256 after she had some food. She  reports feeling much better now and will continue her regular insulin regimen and will only take here evening dose of levemir tonight.  I counseled her on always checking her medications prior to administrating and making sure she is taking the correct dose.  I have also advised her to follow up with her pcp or in [facility] since she has been adjusting her insulin regimen herself but does have a history of hypoglycemia.  She informed me that she has an appointment with her PCP on 03/11/13.  Since [name] feels better now and able to resume her regular insulin regimen and monitor her sugars I have just asked her to call us if she needs [place] again.  She thanked me for the calls and voices understanding of her insulin regimen and cbg monitoring. As always, she is advised that if her symptoms worsen or new symptoms arise, to go to urgent care or ED for further evaluation.  I will forward this note to PCP.

## 2013-02-28 IMAGING — CR DG CHEST 2V
2 series · 2 of 2 positions shown · non-contrast
Comparison: 10/02/2010.

CLINICAL DATA: Follow-up surgery.  Shortness of breath.

CHEST - 2 VIEW

[view not recorded (1 of 2)]
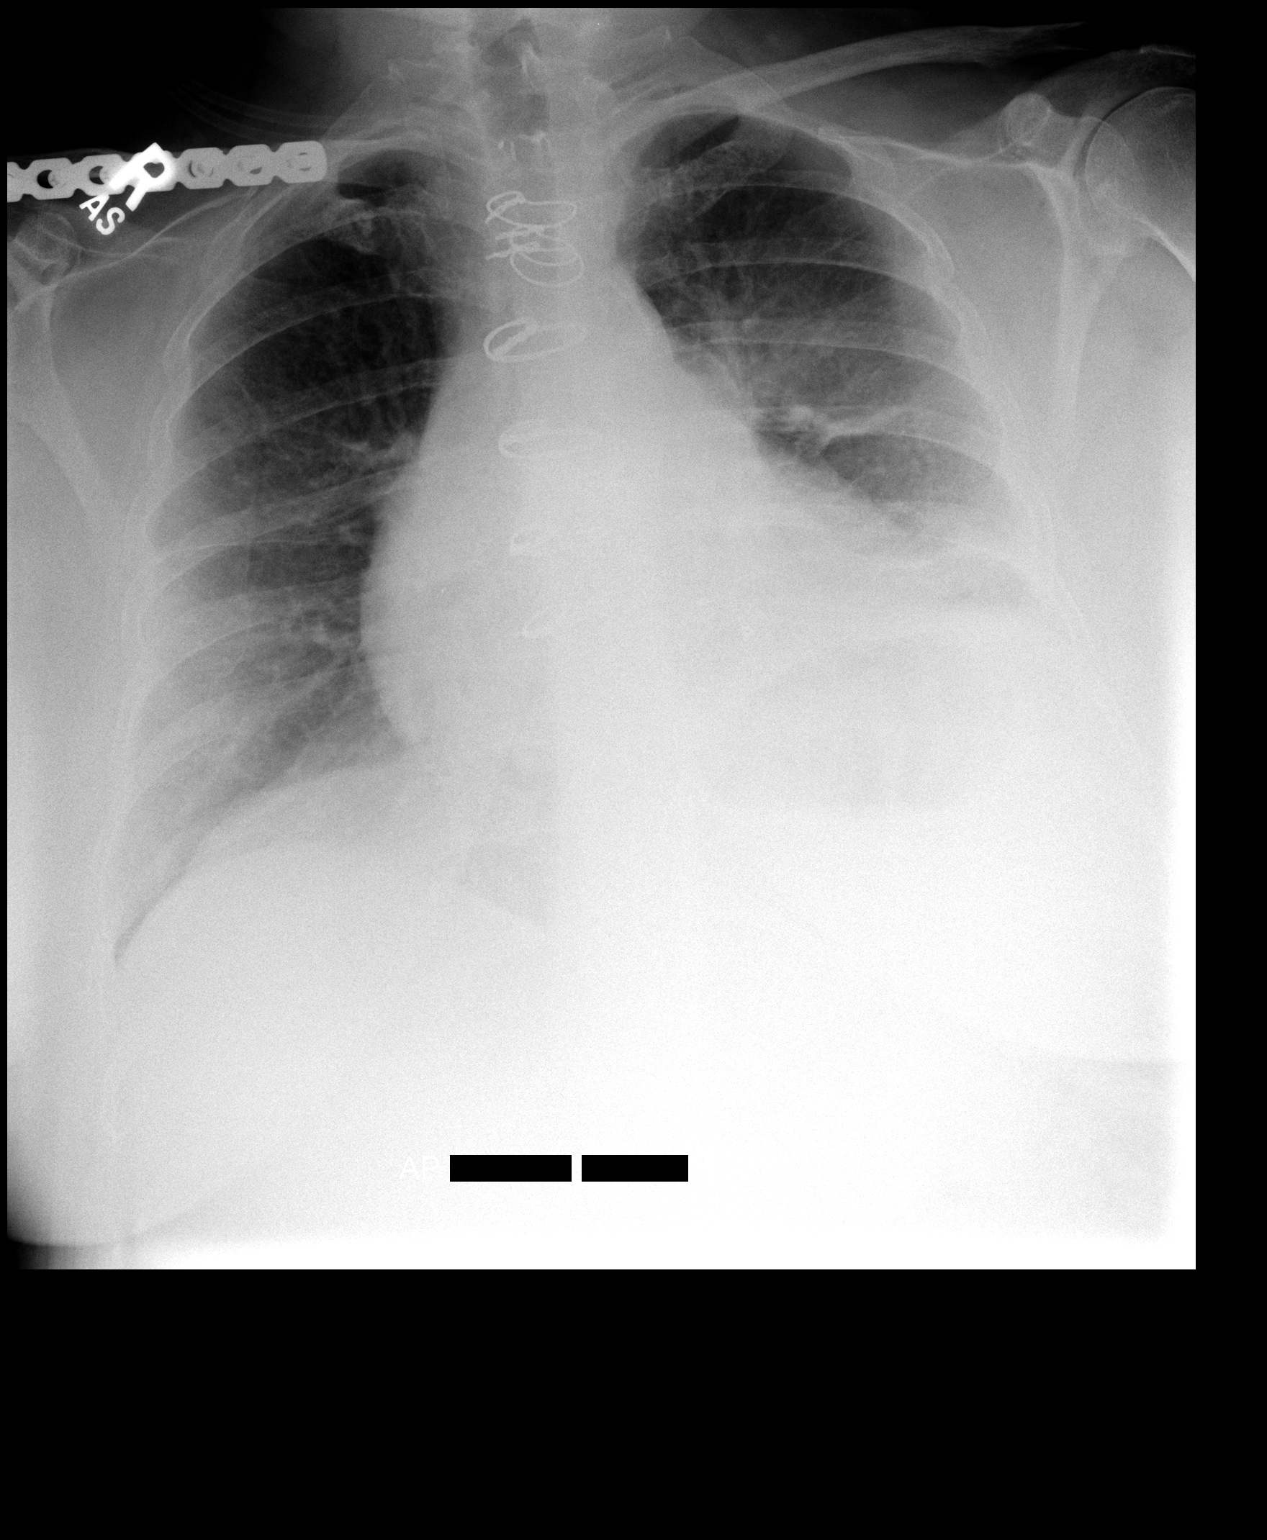

[view not recorded (2 of 2)]
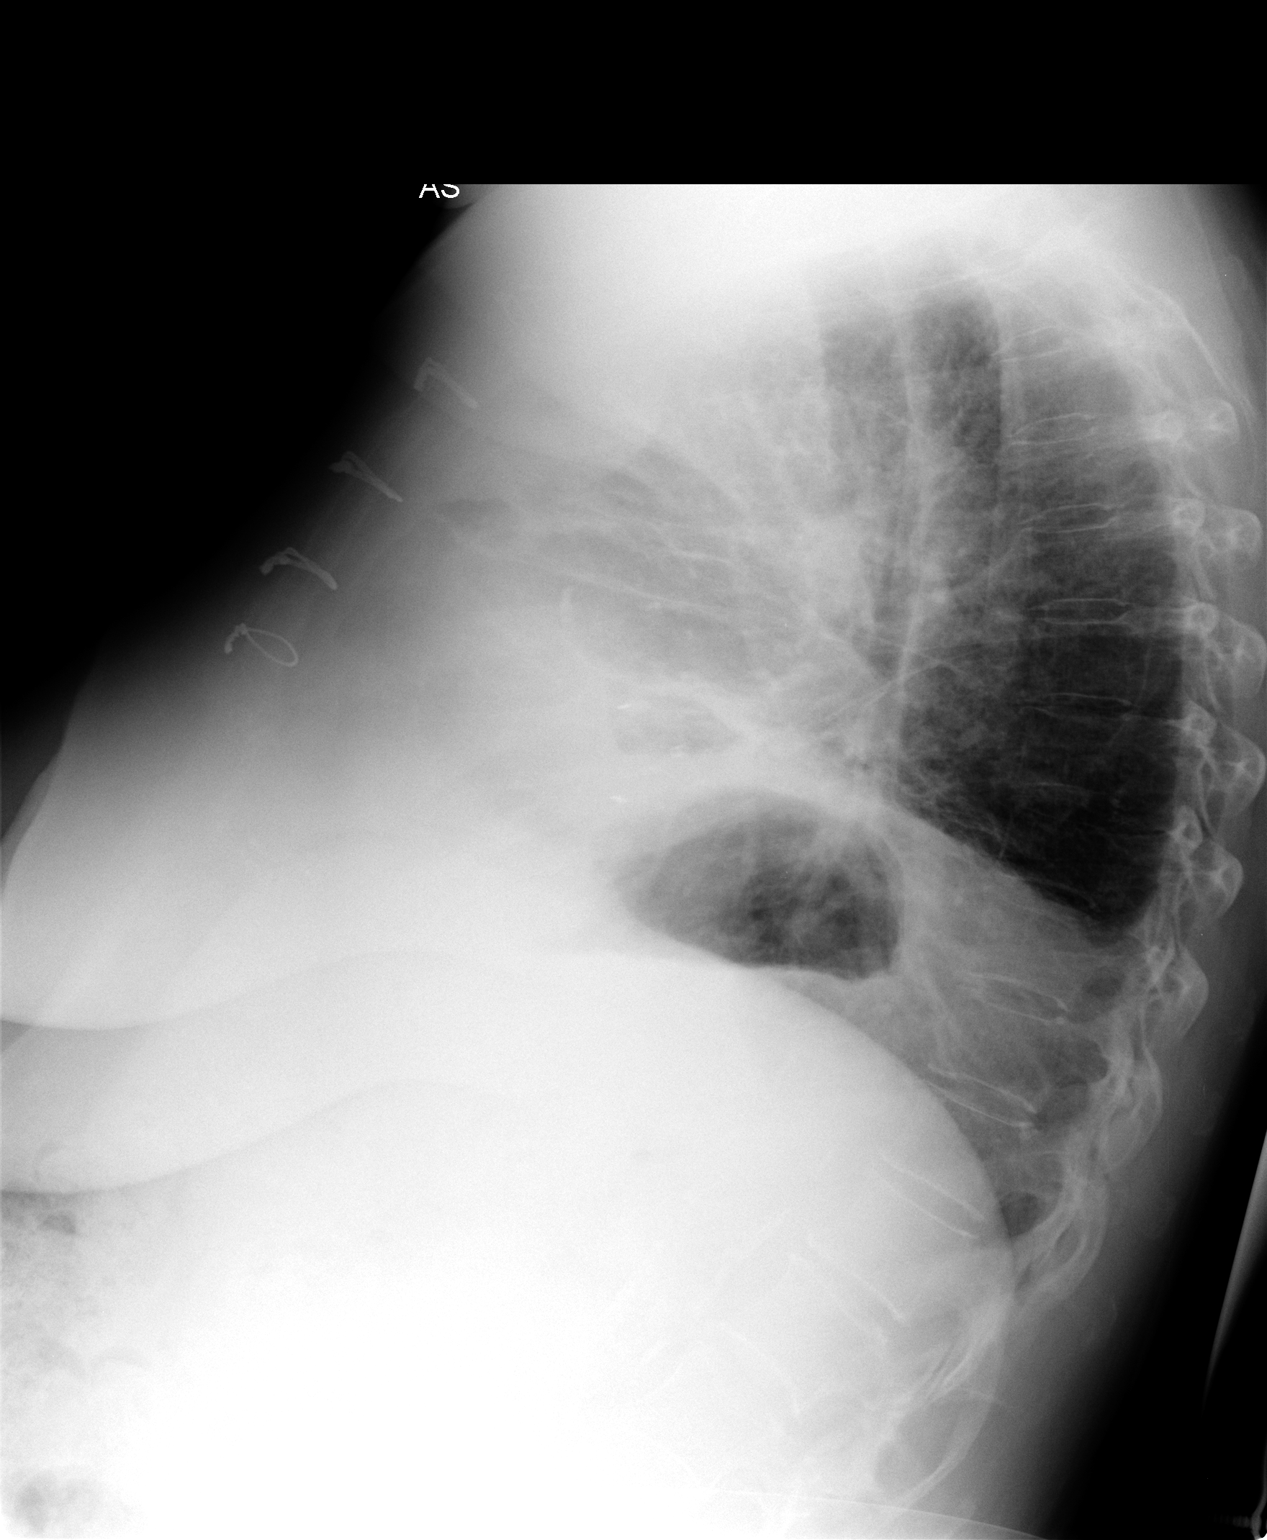

[2 of 2 positions shown; findings below may reference images not displayed]

FINDINGS: Trachea is midline.  Heart size stable.  Sternotomy wires
are stable in position.  Patchy airspace disease in the left
perihilar region and linear densities in both lower lobes persist.
Right clavicle fixation is noted.
IMPRESSION: Patchy airspace opacification in the left perihilar region, and
bibasilar atelectasis, persist.

## 2013-03-01 ENCOUNTER — Telehealth: Payer: Self-pay | Admitting: Dietician

## 2013-03-01 DIAGNOSIS — E1140 Type 2 diabetes mellitus with diabetic neuropathy, unspecified: Secondary | ICD-10-CM

## 2013-03-01 MED ORDER — INSULIN ASPART 100 UNIT/ML ~~LOC~~ SOLN: 18.0000 [IU] | Freq: Three times a day (TID) | SUBCUTANEOUS | Status: DC

## 2013-03-01 NOTE — Telephone Encounter (Signed)
Wants to know if an updated novolog rx for 18 units plus correction can be sent in to her pharmacy. She Is supposed to be  Taking 15 units novolog, but she is taking 18 units. No signs & symptoms of infection or hypoglycemia  Has been taking 18 units plus correction for the past week and a half because her sugars have been higher. She thinks she has been snacking too much.  CBGs on 18 units 3x a day plus correction are: Breakfast 2nd meal 3rd meal 170 today 214       170   248

## 2013-03-04 ENCOUNTER — Other Ambulatory Visit: Payer: Self-pay | Admitting: Internal Medicine

## 2013-03-04 DIAGNOSIS — E1140 Type 2 diabetes mellitus with diabetic neuropathy, unspecified: Secondary | ICD-10-CM

## 2013-03-04 DIAGNOSIS — I5030 Unspecified diastolic (congestive) heart failure: Secondary | ICD-10-CM

## 2013-03-09 ENCOUNTER — Encounter: Payer: Self-pay | Admitting: Internal Medicine

## 2013-03-11 ENCOUNTER — Encounter: Payer: Medicare Other | Admitting: Internal Medicine

## 2013-03-14 ENCOUNTER — Ambulatory Visit (INDEPENDENT_AMBULATORY_CARE_PROVIDER_SITE_OTHER): Payer: Medicare Other | Admitting: Pharmacist

## 2013-03-14 DIAGNOSIS — D735 Infarction of spleen: Secondary | ICD-10-CM

## 2013-03-14 DIAGNOSIS — Z7901 Long term (current) use of anticoagulants: Secondary | ICD-10-CM

## 2013-03-14 DIAGNOSIS — I4891 Unspecified atrial fibrillation: Secondary | ICD-10-CM

## 2013-03-14 DIAGNOSIS — I480 Paroxysmal atrial fibrillation: Secondary | ICD-10-CM

## 2013-03-14 DIAGNOSIS — D7389 Other diseases of spleen: Secondary | ICD-10-CM

## 2013-03-14 LAB — POCT INR: INR: 2.1

## 2013-03-14 NOTE — Progress Notes (Signed)
Anti-Coagulation Progress Note  [name] is a 63 y.o. female who is currently on an anti-coagulation regimen.    RECENT RESULTS: Recent results are below, the most recent result is correlated with a dose of 17.5 mg. per week: Lab Results  Component Value Date   INR 2.10 03/14/2013   INR 2.7 02/09/2013   INR 2.0 01/19/2013    ANTI-COAG DOSE: Anticoagulation Dose Instructions as of 03/14/2013     [name] Tue Wed Thu Fri Sat   New Dose 2.5 mg 2.5 mg 2.5 mg 2.5 mg 2.5 mg 2.5 mg 2.5 mg       ANTICOAG SUMMARY: Anticoagulation Episode Summary   Current INR goal 2.0-3.0  Next INR check 04/11/2013  INR from last check 2.10 (03/14/2013)  Weekly max dose   Target end date   [facility]  Preferred lab   Send INR reminders to [place]   Indications  Paroxysmal atrial fibrillation [427.31] Long term (current) use of anticoagulants [V58.61]        Comments       Anticoagulation Care Providers   Provider Role Specialty Phone number   Lelon Perla, MD  Cardiology [phone]      ANTICOAG TODAY: Anticoagulation Summary as of 03/14/2013   INR goal 2.0-3.0  Selected INR 2.10 (03/14/2013)  Next INR check 04/11/2013  Target end date    Indications  Paroxysmal atrial fibrillation [427.31] Long term (current) use of anticoagulants [V58.61]      Anticoagulation Episode Summary   [facility] check [facility]   Preferred lab    Send INR reminders to [place]   Comments     Anticoagulation Care Providers   Provider Role Specialty Phone number   Lelon Perla, MD  Cardiology [phone]      PATIENT INSTRUCTIONS: Patient Instructions  Patient instructed to take medications as defined in the Anti-coagulation Track section of this encounter.  Patient instructed to take today's dose.  Patient verbalized understanding of these instructions.       FOLLOW-UP Return in 4 weeks (on 04/11/2013) for Follow  up INR at 0900h.  Jorene Guest, III Pharm.D., CACP

## 2013-03-14 NOTE — Patient Instructions (Signed)
Patient instructed to take medications as defined in the Anti-coagulation Track section of this encounter.  Patient instructed to take today's dose.  Patient verbalized understanding of these instructions.    

## 2013-03-16 ENCOUNTER — Encounter: Payer: Self-pay | Admitting: Internal Medicine

## 2013-03-21 ENCOUNTER — Encounter: Payer: Self-pay | Admitting: Internal Medicine

## 2013-03-21 IMAGING — CR DG CHEST 2V
2 series · 2 of 2 positions shown · non-contrast
Comparison: The chest x-ray of 10/23/2010

CLINICAL DATA: Heart surgery 3 months ago, shortness of breath

CHEST - 2 VIEW

[w chest pa]
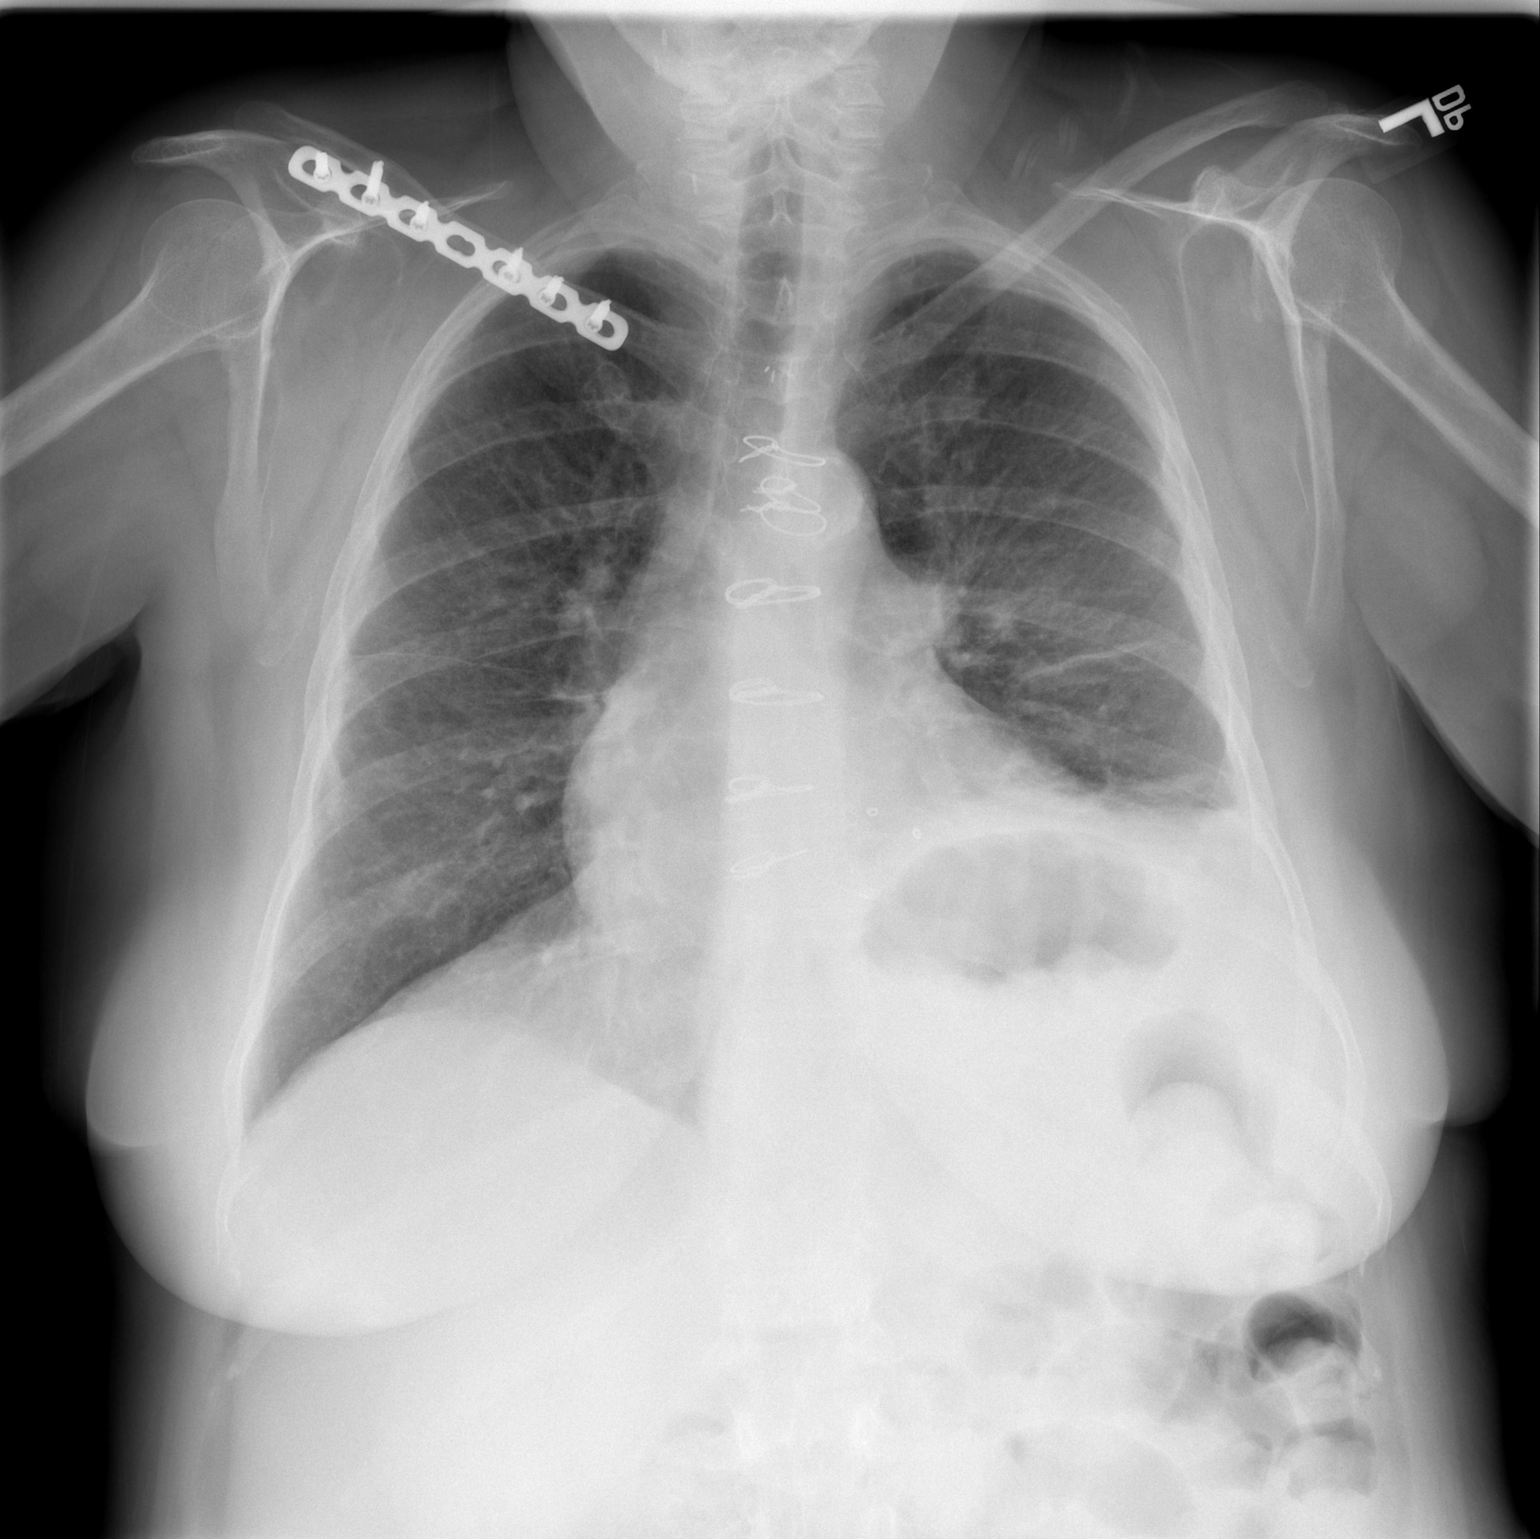

[w chest lat]
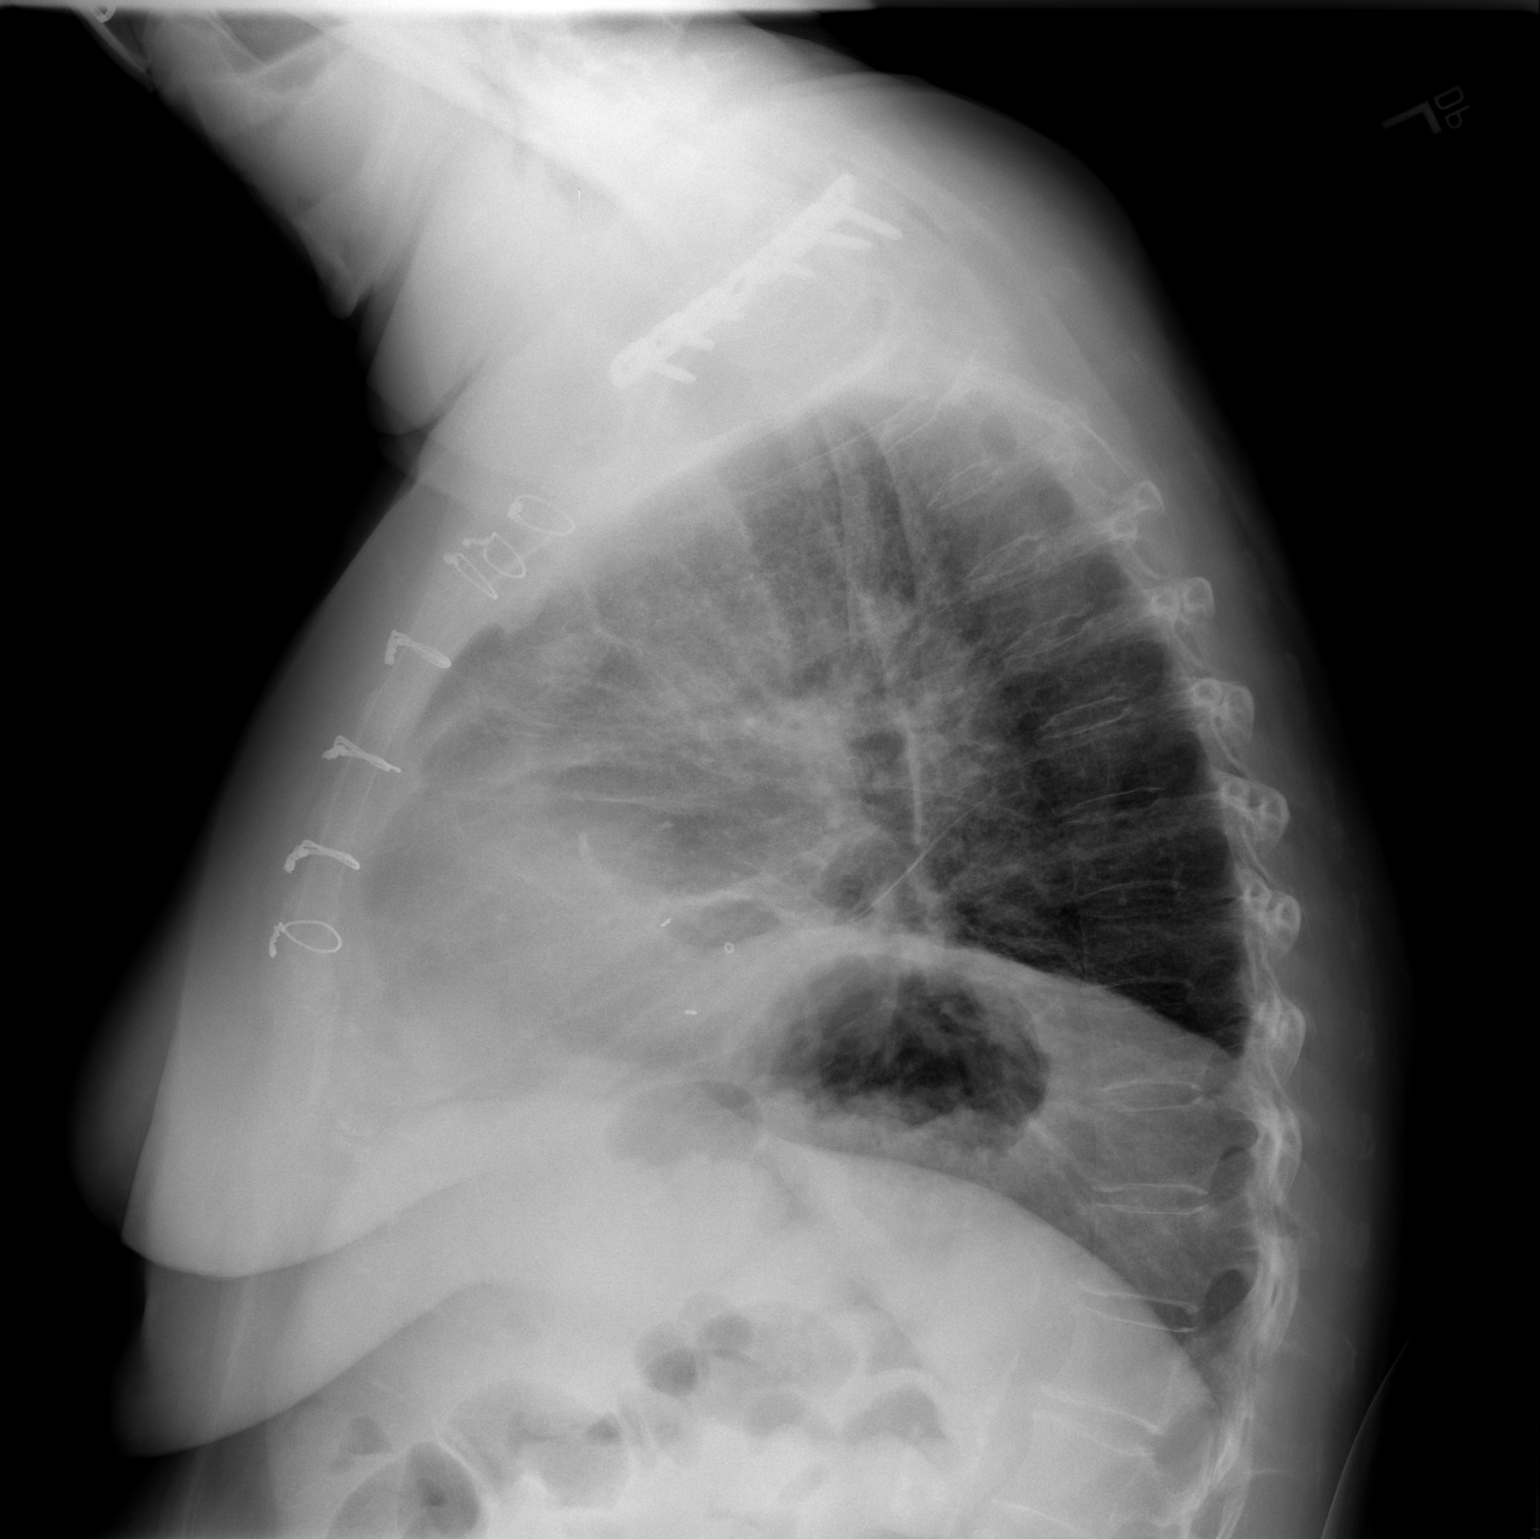

[2 of 2 positions shown; findings below may reference images not displayed]

FINDINGS: Aeration of the lungs has improved.  There is still left
basilar opacity most consistent with atelectasis with some
elevation of the left hemidiaphragm.  Cardiomegaly is stable.
Median sternotomy sutures are noted.  Plate and screw fixation of a
prior right clavicular fracture is noted.  No acute bony
abnormality is seen.
IMPRESSION: Improved aeration.  Left basilar atelectasis remains with elevation
of the left hemidiaphragm.

## 2013-03-23 ENCOUNTER — Telehealth: Payer: Self-pay | Admitting: *Deleted

## 2013-03-23 DIAGNOSIS — J309 Allergic rhinitis, unspecified: Secondary | ICD-10-CM

## 2013-03-23 DIAGNOSIS — K219 Gastro-esophageal reflux disease without esophagitis: Secondary | ICD-10-CM

## 2013-03-23 DIAGNOSIS — F419 Anxiety disorder, unspecified: Secondary | ICD-10-CM

## 2013-03-23 DIAGNOSIS — E1140 Type 2 diabetes mellitus with diabetic neuropathy, unspecified: Secondary | ICD-10-CM

## 2013-03-29 ENCOUNTER — Other Ambulatory Visit: Payer: Self-pay | Admitting: Surgery

## 2013-03-29 DIAGNOSIS — I6529 Occlusion and stenosis of unspecified carotid artery: Secondary | ICD-10-CM

## 2013-03-30 NOTE — Telephone Encounter (Signed)
Pt calls to check status of her medications that are no longer being covered by her insurance. See below   1)omeprazole 40 mg caps (qty limits apply) insurance will only by for once a day dosing.  Pt states she has already dicussed this with pcp and is in agreement to try once daily dosing.   2) Novolog Flexpen (non-formulary) insurance will pay for the alternative Humalog Kwikpen.      3) Alprazolam 1 mg tabs-(medication in non-formulary). No alt available, and although medication is non-formulary,  insurance will pay for medication at a higher co-pay. Price quoted was $2.65 for #30, was unable to get price quote on #60, but represenative stated that it would be similar to #30 cost.  Of note, pt states she was quoted $4 for  #60.  Pt stated she will pay higher copay if cheaper alternative was not available.  In addition to new rxs, pt would also like to have her Flonase rx sent to [facility].   Will send to pcp for review and to update med list in chart.  Please advise.Despina Hidden Cassady2/18/201510:57 AM

## 2013-03-31 MED ORDER — FLUTICASONE PROPIONATE 50 MCG/ACT NA SUSP: 2.0000 | Freq: Every day | NASAL | Status: DC

## 2013-03-31 MED ORDER — OMEPRAZOLE 40 MG PO CPDR: 40.0000 mg | DELAYED_RELEASE_CAPSULE | Freq: Every day | ORAL | Status: DC

## 2013-03-31 MED ORDER — ALPRAZOLAM 1 MG PO TABS: 0.5000 mg | ORAL_TABLET | Freq: Two times a day (BID) | ORAL | Status: DC | PRN

## 2013-03-31 MED ORDER — INSULIN LISPRO 100 UNIT/ML (KWIKPEN): 18.0000 [IU] | PEN_INJECTOR | Freq: Three times a day (TID) | SUBCUTANEOUS | Status: DC

## 2013-03-31 NOTE — Telephone Encounter (Signed)
I renewed the following medications:  Omeprazole 40 mg daily Alprazolam 05 mg every 12 hours as needed for anxiety Flonase 2 sprays each nostril daily  I discontinued the following medication:  Novolog Flexpen  I started the following medication:  Humalog Kwikpen 18 units subcutaneously three times a day before meals  All of the above were done to remain in compliance with her insurance company's formulary.

## 2013-04-11 ENCOUNTER — Ambulatory Visit: Payer: Medicare Other | Admitting: Pulmonary Disease

## 2013-04-11 ENCOUNTER — Ambulatory Visit (INDEPENDENT_AMBULATORY_CARE_PROVIDER_SITE_OTHER): Payer: Medicare Other | Admitting: Pharmacist

## 2013-04-11 DIAGNOSIS — D735 Infarction of spleen: Secondary | ICD-10-CM

## 2013-04-11 DIAGNOSIS — Z7901 Long term (current) use of anticoagulants: Secondary | ICD-10-CM

## 2013-04-11 DIAGNOSIS — I480 Paroxysmal atrial fibrillation: Secondary | ICD-10-CM

## 2013-04-11 DIAGNOSIS — D7389 Other diseases of spleen: Secondary | ICD-10-CM

## 2013-04-11 DIAGNOSIS — I4891 Unspecified atrial fibrillation: Secondary | ICD-10-CM

## 2013-04-11 LAB — POCT INR: INR: 2.9

## 2013-04-11 NOTE — Progress Notes (Signed)
Anti-Coagulation Progress Note  [name] is a 63 y.o. female who is currently on an anti-coagulation regimen.    RECENT RESULTS: Recent results are below, the most recent result is correlated with a dose of 17.5 mg. per week: Lab Results  Component Value Date   INR 2.90 04/11/2013   INR 2.10 03/14/2013   INR 2.7 02/09/2013    ANTI-COAG DOSE: Anticoagulation Dose Instructions as of 04/11/2013     [name] Tue Wed Thu Fri Sat   New Dose 2.5 mg 2.5 mg 2.5 mg 2.5 mg 2.5 mg 2.5 mg 2.5 mg       ANTICOAG SUMMARY: Anticoagulation Episode Summary   Current INR goal 2.0-3.0  Next INR check 05/02/2013  INR from last check 2.90 (04/11/2013)  Weekly max dose   Target end date   [facility]  Preferred lab   Send INR reminders to [place]   Indications  Paroxysmal atrial fibrillation [427.31] Long term (current) use of anticoagulants [V58.61]        Comments       Anticoagulation Care Providers   Provider Role Specialty Phone number   Lelon Perla, MD  Cardiology [phone]      ANTICOAG TODAY: Anticoagulation Summary as of 04/11/2013   INR goal 2.0-3.0  Selected INR 2.90 (04/11/2013)  Next INR check 05/02/2013  Target end date    Indications  Paroxysmal atrial fibrillation [427.31] Long term (current) use of anticoagulants [V58.61]      Anticoagulation Episode Summary   [facility]   Preferred lab    Send INR reminders to [place]   Comments     Anticoagulation Care Providers   Provider Role Specialty Phone number   Lelon Perla, MD  Cardiology [phone]      PATIENT INSTRUCTIONS: Patient Instructions  Patient instructed to take medications as defined in the Anti-coagulation Track section of this encounter.  Patient instructed to take today's dose.  Patient verbalized understanding of these instructions.       FOLLOW-UP Return in 3 weeks (on 05/02/2013) for Follow  up INR at 0930h.  Jorene Guest, III Pharm.D., CACP

## 2013-04-11 NOTE — Patient Instructions (Signed)
Patient instructed to take medications as defined in the Anti-coagulation Track section of this encounter.  Patient instructed to take today's dose.  Patient verbalized understanding of these instructions.

## 2013-04-13 ENCOUNTER — Encounter: Payer: Self-pay | Admitting: Cardiology

## 2013-04-14 ENCOUNTER — Telehealth: Payer: Self-pay | Admitting: Cardiology

## 2013-04-14 NOTE — Telephone Encounter (Signed)
Left message for pt to call

## 2013-04-14 NOTE — Telephone Encounter (Signed)
New message     Talk to [name] only-----pt would not tell me what she wanted

## 2013-04-14 NOTE — Telephone Encounter (Signed)
Spoke with pt, she has a follow up with dr Stanford Breed on Monday and also has a follow up with her PCP. She cancelled the appt with [place], she really can not afford both. She will follow up with [place] in 3 months which will be 6 months after her last visit. She will call with any other iassues prior to appt.

## 2013-04-18 ENCOUNTER — Ambulatory Visit: Payer: Medicare Other | Admitting: Cardiology

## 2013-04-28 ENCOUNTER — Other Ambulatory Visit: Payer: Self-pay | Admitting: *Deleted

## 2013-04-28 DIAGNOSIS — E1140 Type 2 diabetes mellitus with diabetic neuropathy, unspecified: Secondary | ICD-10-CM

## 2013-04-28 MED ORDER — INSULIN DETEMIR 100 UNIT/ML FLEXPEN: 36.0000 [IU] | PEN_INJECTOR | Freq: Two times a day (BID) | SUBCUTANEOUS | Status: DC

## 2013-04-29 ENCOUNTER — Encounter: Payer: Self-pay | Admitting: Internal Medicine

## 2013-04-29 ENCOUNTER — Ambulatory Visit (INDEPENDENT_AMBULATORY_CARE_PROVIDER_SITE_OTHER): Payer: Medicare Other | Admitting: Internal Medicine

## 2013-04-29 VITALS — BP 119/66 | HR 72 | Temp 97.0°F | Wt 194.9 lb

## 2013-04-29 DIAGNOSIS — G8929 Other chronic pain: Secondary | ICD-10-CM

## 2013-04-29 DIAGNOSIS — M545 Low back pain, unspecified: Secondary | ICD-10-CM

## 2013-04-29 DIAGNOSIS — J438 Other emphysema: Secondary | ICD-10-CM

## 2013-04-29 DIAGNOSIS — R519 Headache, unspecified: Secondary | ICD-10-CM

## 2013-04-29 DIAGNOSIS — K219 Gastro-esophageal reflux disease without esophagitis: Secondary | ICD-10-CM

## 2013-04-29 DIAGNOSIS — K5900 Constipation, unspecified: Secondary | ICD-10-CM

## 2013-04-29 DIAGNOSIS — R110 Nausea: Secondary | ICD-10-CM

## 2013-04-29 DIAGNOSIS — Z0000 Encounter for general adult medical examination without abnormal findings: Secondary | ICD-10-CM

## 2013-04-29 DIAGNOSIS — E1149 Type 2 diabetes mellitus with other diabetic neurological complication: Secondary | ICD-10-CM

## 2013-04-29 DIAGNOSIS — F329 Major depressive disorder, single episode, unspecified: Secondary | ICD-10-CM

## 2013-04-29 DIAGNOSIS — J439 Emphysema, unspecified: Secondary | ICD-10-CM

## 2013-04-29 DIAGNOSIS — E1140 Type 2 diabetes mellitus with diabetic neuropathy, unspecified: Secondary | ICD-10-CM

## 2013-04-29 DIAGNOSIS — F17200 Nicotine dependence, unspecified, uncomplicated: Secondary | ICD-10-CM

## 2013-04-29 DIAGNOSIS — F32A Depression, unspecified: Secondary | ICD-10-CM

## 2013-04-29 DIAGNOSIS — K5909 Other constipation: Secondary | ICD-10-CM

## 2013-04-29 DIAGNOSIS — R51 Headache: Secondary | ICD-10-CM

## 2013-04-29 DIAGNOSIS — M810 Age-related osteoporosis without current pathological fracture: Secondary | ICD-10-CM

## 2013-04-29 DIAGNOSIS — F3289 Other specified depressive episodes: Secondary | ICD-10-CM

## 2013-04-29 DIAGNOSIS — Z720 Tobacco use: Secondary | ICD-10-CM

## 2013-04-29 LAB — GLUCOSE, CAPILLARY: Glucose-Capillary: 136 mg/dL — ABNORMAL HIGH (ref 70–99)

## 2013-04-29 LAB — POCT GLYCOSYLATED HEMOGLOBIN (HGB A1C): Hemoglobin A1C: 7.9

## 2013-04-29 MED ORDER — HYDROCODONE-ACETAMINOPHEN 5-325 MG PO TABS: 1.0000 | ORAL_TABLET | Freq: Four times a day (QID) | ORAL | Status: DC | PRN

## 2013-04-29 MED ORDER — INSULIN DETEMIR 100 UNIT/ML FLEXPEN: 38.0000 [IU] | PEN_INJECTOR | Freq: Two times a day (BID) | SUBCUTANEOUS | Status: DC

## 2013-04-29 NOTE — Assessment & Plan Note (Signed)
Her generalized pain continues to worsen. Her back pain is stable on the Vicodin. We will therefore continue the Vicodin at 180 tablets per month. She was provided are copies for the next 2 months. If she continues to have worsening generalized pain I asked her to call me and leave a message. At that point we will consider restarting the Cymbalta to see if that helps with the pain. If we do so we may need to adjust the Zoloft.

## 2013-04-29 NOTE — Assessment & Plan Note (Signed)
Unfortunately she restarted smoking again. She states she's not ready to quit at this time although she understands the importance of doing so. We discussed the options for pharmacal therapy in order to help her with smoking cessation. She states she's been on Wellbutrin and Chantix in the past.  Chantix has not been helpful. She has also been on the nicotine patches. She was asked to contact me should she want to quit smoking and I can provide her with the nicotine patches. We will continue the counseling at the followup visit.

## 2013-04-29 NOTE — Assessment & Plan Note (Signed)
Her hemoglobin A1c today was 7.9. This is elevated from the last check. She states she's been compliant with her Levemir at 36 units twice a day and her short acting Humalog at 18 units with each meal. She's had no lows on this regimen. Her meter was downloaded and demonstrated an average reading of approximately 200 with 72% of the readings being above target and 28% being at target. She believes this is related to a change in diet where she is eating less fresh vegetables over the winter months. She's made no other changes in her diet and states that she has not increased the amount of food she is eating. We decided to increase the Levemir to 38 units subcutaneously twice daily and continue with the short acting insulin at 18 units before each meal. We will reassess her control at the return visit, which happens to be when fresh vegetables are available. We anticipate her diabetic control will be improved at that time. She is up-to-date on her diabetic eye exam although she is due for one next month and this has been scheduled.  Her diabetic foot exam is also up-to-date.

## 2013-04-29 NOTE — Assessment & Plan Note (Signed)
Her constipation is stable on daily Colace with a daily probiotic. We will continue the Colace and she plans on continuing the probiotic.

## 2013-04-29 NOTE — Assessment & Plan Note (Signed)
She is due for a mammogram in June. A request to have this done was submitted. She's also due for a Pap smear and was given an appointment to see me in June to also complete this.

## 2013-04-29 NOTE — Assessment & Plan Note (Signed)
She stopped taking the alendronate and is currently not interested in restarting it at this time. She has been compliant with her calcium. We will readdress the bisphosphonate therapy at the followup visit.

## 2013-04-29 NOTE — Patient Instructions (Signed)
It was great to see you again.  I am sorry you are having so many problems.  1) Increase your levemir to 38 units twice a day.  2) Continue all of your other medications as you are.  3) Try tums when you get the chest discomfort since they seem to have worked in the past.  If this does not work let me know.  4) I am more than happy to manage all of your cardiology issues at this time.  5) I will schedule you for a PAP smear and mammogram for around June.  6)  I will see you in June for a PAP smear but call if you need to be seen sooner.

## 2013-04-29 NOTE — Assessment & Plan Note (Signed)
She states that her depression has been relatively well controlled on the Zoloft. We will therefore continue this medication. If her pain becomes unbearable she may request a restart of her Cymbalta. If this occurs we may need to adjust her Zoloft.

## 2013-04-29 NOTE — Assessment & Plan Note (Signed)
This is stable on the as needed Phenergan. We will continue the Phenergan 12.5 mg every 6 hours as needed for nausea and reassess her symptoms at the followup visit.

## 2013-04-29 NOTE — Assessment & Plan Note (Signed)
Her symptoms are stable on the albuterol, Spiriva, and Advair inhalers. She is also on chronic home oxygen. We will continue with this regimen. The importance of smoking cessation was once again stressed and will be readdressed at the followup visits.

## 2013-04-29 NOTE — Progress Notes (Signed)
   Subjective:    Patient ID: [name], female    DOB: February 02, 1951, 63 y.o.   MRN: [id number]  HPI  Please see the A&P for the status of the pt's chronic medical problems.  Review of Systems  Constitutional: Positive for diaphoresis. Negative for chills, activity change, appetite change and unexpected weight change.  HENT: Positive for sinus pressure.   Respiratory: Positive for chest tightness and shortness of breath. Negative for cough, choking, wheezing and stridor.   Cardiovascular: Positive for leg swelling. Negative for palpitations.  Gastrointestinal: Positive for nausea, abdominal pain and constipation. Negative for vomiting and anal bleeding.       One episode of LLQ pain with defication  Musculoskeletal: Positive for arthralgias, back pain, gait problem and myalgias. Negative for joint swelling and neck pain.  Skin: Negative for rash and wound.  Neurological: Positive for headaches.      Objective:   Physical Exam  Nursing note and vitals reviewed. Constitutional: She is oriented to person, place, and time. She appears well-developed and well-nourished. No distress.  HENT:  Head: Normocephalic and atraumatic.  Eyes: Conjunctivae are normal. Right eye exhibits no discharge. Left eye exhibits no discharge. No scleral icterus.  Cardiovascular: Normal rate, regular rhythm and normal heart sounds.  Exam reveals no gallop and no friction rub.   No murmur heard. Pulmonary/Chest: Effort normal and breath sounds normal. No respiratory distress. She has no wheezes. She has no rales.  Abdominal: Soft. Bowel sounds are normal. She exhibits no distension. There is no tenderness. There is no rebound and no guarding.  Musculoskeletal: Normal range of motion. She exhibits edema. She exhibits no tenderness.  Neurological: She is alert and oriented to person, place, and time. She has normal reflexes. She exhibits normal muscle tone.  Skin: Skin is warm and dry. No rash noted. She is  not diaphoretic. No erythema.  Psychiatric: She has a normal mood and affect. Her behavior is normal. Judgment and thought content normal.      Assessment & Plan:   Please see problem oriented charting.

## 2013-04-29 NOTE — Assessment & Plan Note (Signed)
Despite what her insurance plan states she notes she needs to take the PPI therapy twice daily for symptom control. She is willing to try a PPI in the morning and TUMS in the evening to see if this controls her pain. If it does not we will ask the insurance company to reconsider a twice a day dosing to gain control of her symptomatic gastroesophageal reflux disease.

## 2013-04-29 NOTE — Assessment & Plan Note (Signed)
She notes chronic daily headaches. She has a history of migraines but states these are somewhat different. Nonetheless, they can be left-sided and worsened by the light. At this point she's not able to stop the Vicodin or other analgesics given her chronic pain issues. Therefore if this is an analgesic headache she prefers to remain on analgesics for the pain. For the chronic daily headache we will continue with the Vicodin for now. If this should worsen or persist we could consider trying a prophylactic for migraine headaches given her prior history and the symptoms.

## 2013-05-02 ENCOUNTER — Ambulatory Visit: Payer: Medicare Other

## 2013-05-16 ENCOUNTER — Other Ambulatory Visit: Payer: Self-pay | Admitting: Internal Medicine

## 2013-05-16 ENCOUNTER — Telehealth: Payer: Self-pay | Admitting: *Deleted

## 2013-05-16 ENCOUNTER — Ambulatory Visit (INDEPENDENT_AMBULATORY_CARE_PROVIDER_SITE_OTHER): Payer: Medicare Other | Admitting: Pharmacist

## 2013-05-16 DIAGNOSIS — D638 Anemia in other chronic diseases classified elsewhere: Secondary | ICD-10-CM

## 2013-05-16 DIAGNOSIS — Z7901 Long term (current) use of anticoagulants: Secondary | ICD-10-CM

## 2013-05-16 DIAGNOSIS — D735 Infarction of spleen: Secondary | ICD-10-CM

## 2013-05-16 DIAGNOSIS — D7389 Other diseases of spleen: Secondary | ICD-10-CM

## 2013-05-16 DIAGNOSIS — I480 Paroxysmal atrial fibrillation: Secondary | ICD-10-CM

## 2013-05-16 DIAGNOSIS — I4891 Unspecified atrial fibrillation: Secondary | ICD-10-CM

## 2013-05-16 LAB — POCT INR: INR: 2.5

## 2013-05-16 NOTE — Progress Notes (Signed)
Indication: Paroxysmal Afib. Duration: Lifelong INR: On target. I agree with Dr. Gladstone Pih assessment and plan.

## 2013-05-16 NOTE — Progress Notes (Signed)
Anti-Coagulation Progress Note  [name] is a 63 y.o. female who is currently on an anti-coagulation regimen.    RECENT RESULTS: Recent results are below, the most recent result is correlated with a dose of 17.5 mg. per week: Lab Results  Component Value Date   INR 2.50 05/16/2013   INR 2.90 04/11/2013   INR 2.10 03/14/2013    ANTI-COAG DOSE: Anticoagulation Dose Instructions as of 05/16/2013     [name] Tue Wed Thu Fri Sat   New Dose 2.5 mg 2.5 mg 2.5 mg 2.5 mg 2.5 mg 2.5 mg 2.5 mg       ANTICOAG SUMMARY: Anticoagulation Episode Summary   Current INR goal 2.0-3.0  Next INR check 06/13/2013  INR from last check 2.50 (05/16/2013)  Weekly max dose   Target end date   [facility]  Preferred lab   Send INR reminders to [place]   Indications  Paroxysmal atrial fibrillation [427.31] Long term (current) use of anticoagulants [V58.61]        Comments       Anticoagulation Care Providers   Provider Role Specialty Phone number   Lelon Perla, MD  Cardiology [phone]      ANTICOAG TODAY: Anticoagulation Summary as of 05/16/2013   INR goal 2.0-3.0  Selected INR 2.50 (05/16/2013)  Next INR check 06/13/2013  Target end date    Indications  Paroxysmal atrial fibrillation [427.31] Long term (current) use of anticoagulants [V58.61]      Anticoagulation Episode Summary   [facility] check [facility]   Preferred lab    Send INR reminders to [place]   Comments     Anticoagulation Care Providers   Provider Role Specialty Phone number   Lelon Perla, MD  Cardiology [phone]      PATIENT INSTRUCTIONS: Patient Instructions  Patient instructed to take medications as defined in the Anti-coagulation Track section of this encounter.  Patient instructed to take today's dose.  Patient verbalized understanding of these instructions.       FOLLOW-UP Return in 4 weeks (on 06/13/2013) for Follow up  INR at 0845h.  Jorene Guest, III Pharm.D., CACP

## 2013-05-16 NOTE — Telephone Encounter (Signed)
Pt her for coumadin clinic visit with Dr. Elie Confer and requests lab draw to have iron levels checked.  Feels extremely sleepy and fatigued with no other symptoms.   Denies blood in stool/urine, increased shortness of breath, or abd pain INR 2.5 today. Has follow up appt with pcp on 08/05/13.  Pt also wanted to make sure we made DrCrenshaw's office aware that we are handling all her cardiology issues at this time. Will forward info to PCP for review.  Please advise.Despina Hidden Cassady4/6/20153:51 PM

## 2013-05-16 NOTE — Progress Notes (Signed)
Patient instructed to take medications as defined in the Anti-coagulation Track section of this encounter.  Patient instructed to take today's dose.  Patient verbalized understanding of these instructions.    

## 2013-05-16 NOTE — Progress Notes (Signed)
Also completed paperwork for pulmonary rehab per patient request.

## 2013-05-16 NOTE — Patient Instructions (Signed)
Patient instructed to take medications as defined in the Anti-coagulation Track section of this encounter.  Patient instructed to take today's dose.  Patient verbalized understanding of these instructions.    

## 2013-05-27 ENCOUNTER — Telehealth: Payer: Self-pay | Admitting: Internal Medicine

## 2013-05-27 NOTE — Telephone Encounter (Signed)
Called Dr. Zenia Resides office and spoke with [name].  This patient was sch for an 05/18/2013 appointment and resch her appointment until 06/29/13 at 2p.m.

## 2013-05-31 ENCOUNTER — Other Ambulatory Visit: Payer: Self-pay | Admitting: Internal Medicine

## 2013-05-31 DIAGNOSIS — F419 Anxiety disorder, unspecified: Secondary | ICD-10-CM

## 2013-06-05 ENCOUNTER — Encounter: Payer: Self-pay | Admitting: Internal Medicine

## 2013-06-13 ENCOUNTER — Ambulatory Visit (INDEPENDENT_AMBULATORY_CARE_PROVIDER_SITE_OTHER): Payer: Medicare Other | Admitting: Pharmacist

## 2013-06-13 ENCOUNTER — Other Ambulatory Visit (INDEPENDENT_AMBULATORY_CARE_PROVIDER_SITE_OTHER): Payer: Medicare Other

## 2013-06-13 DIAGNOSIS — D638 Anemia in other chronic diseases classified elsewhere: Secondary | ICD-10-CM

## 2013-06-13 DIAGNOSIS — D735 Infarction of spleen: Secondary | ICD-10-CM

## 2013-06-13 DIAGNOSIS — I4891 Unspecified atrial fibrillation: Secondary | ICD-10-CM

## 2013-06-13 DIAGNOSIS — D7389 Other diseases of spleen: Secondary | ICD-10-CM

## 2013-06-13 DIAGNOSIS — I480 Paroxysmal atrial fibrillation: Secondary | ICD-10-CM

## 2013-06-13 DIAGNOSIS — Z7901 Long term (current) use of anticoagulants: Secondary | ICD-10-CM

## 2013-06-13 LAB — POCT INR: INR: 2.9

## 2013-06-13 LAB — FERRITIN: Ferritin: 30 ng/mL (ref 10–291)

## 2013-06-13 NOTE — Progress Notes (Signed)
Anti-Coagulation Progress Note  NIL BOLSER is a 63 y.o. female who is currently on an anti-coagulation regimen.    RECENT RESULTS: Recent results are below, the most recent result is correlated with a dose of 17.5 mg. per week: Lab Results  Component Value Date   INR 2.90 06/13/2013   INR 2.50 05/16/2013   INR 2.90 04/11/2013    ANTI-COAG DOSE: Anticoagulation Dose Instructions as of 06/13/2013     [name] Tue Wed Thu Fri Sat   New Dose 2.5 mg 2.5 mg 2.5 mg 2.5 mg 2.5 mg 2.5 mg 2.5 mg       ANTICOAG SUMMARY: Anticoagulation Episode Summary   Current INR goal 2.0-3.0  Next INR check 07/18/2013  INR from last check 2.90 (06/13/2013)  Weekly max dose   Target end date   INR check location [facility]  Preferred lab   Send INR reminders to [place]   Indications  Paroxysmal atrial fibrillation [427.31] Long term (current) use of anticoagulants [V58.61]        Comments       Anticoagulation Care Providers   Provider Role Specialty Phone number   Lelon Perla, MD  Cardiology [phone]      ANTICOAG TODAY: Anticoagulation Summary as of 06/13/2013   INR goal 2.0-3.0  Selected INR 2.90 (06/13/2013)  Next INR check 07/18/2013  Target end date    Indications  Paroxysmal atrial fibrillation [427.31] Long term (current) use of anticoagulants [V58.61]      Anticoagulation Episode Summary   INR check location [facility]   Preferred lab    Send INR reminders to [place]   Comments     Anticoagulation Care Providers   Provider Role Specialty Phone number   Lelon Perla, MD  Cardiology [phone]      PATIENT INSTRUCTIONS: Patient Instructions  Patient instructed to take medications as defined in the Anti-coagulation Track section of this encounter.  Patient instructed to take today's dose.  Patient verbalized understanding of these instructions.       FOLLOW-UP Return in 5 weeks (on 07/18/2013) for Follow up  INR at 0915h.  Jorene Guest, III Pharm.D., CACP

## 2013-06-13 NOTE — Patient Instructions (Signed)
Patient instructed to take medications as defined in the Anti-coagulation Track section of this encounter.  Patient instructed to take today's dose.  Patient verbalized understanding of these instructions.    

## 2013-06-14 LAB — IRON AND TIBC
%SAT: 10 % — ABNORMAL LOW (ref 20–55)
Iron: 36 ug/dL — ABNORMAL LOW (ref 42–145)
TIBC: 350 ug/dL (ref 250–470)
UIBC: 314 ug/dL (ref 125–400)

## 2013-06-23 ENCOUNTER — Other Ambulatory Visit: Payer: Self-pay | Admitting: *Deleted

## 2013-06-23 DIAGNOSIS — G8929 Other chronic pain: Secondary | ICD-10-CM

## 2013-06-23 DIAGNOSIS — M545 Low back pain, unspecified: Secondary | ICD-10-CM

## 2013-06-23 MED ORDER — HYDROCODONE-ACETAMINOPHEN 5-325 MG PO TABS: 1.0000 | ORAL_TABLET | Freq: Four times a day (QID) | ORAL | Status: DC | PRN

## 2013-06-23 NOTE — Telephone Encounter (Signed)
Could she have 3 scripts- may, June, july

## 2013-06-23 NOTE — Telephone Encounter (Signed)
Pt informed Rx are ready

## 2013-06-27 ENCOUNTER — Telehealth (HOSPITAL_COMMUNITY): Payer: Self-pay

## 2013-06-27 ENCOUNTER — Encounter (HOSPITAL_COMMUNITY)
Admission: RE | Admit: 2013-06-27 | Discharge: 2013-06-27 | Disposition: A | Discharge status: Home / Self Care | Payer: Medicare Other | Source: Ambulatory Visit | Attending: Internal Medicine | Admitting: Internal Medicine

## 2013-06-27 VITALS — BP 126/58 | HR 80

## 2013-06-27 DIAGNOSIS — I2789 Other specified pulmonary heart diseases: Secondary | ICD-10-CM | POA: Diagnosis present

## 2013-06-27 DIAGNOSIS — J438 Other emphysema: Secondary | ICD-10-CM | POA: Diagnosis present

## 2013-06-27 DIAGNOSIS — F17200 Nicotine dependence, unspecified, uncomplicated: Secondary | ICD-10-CM | POA: Diagnosis not present

## 2013-06-27 NOTE — Progress Notes (Signed)
[name] 63 y.o. female Pulmonary Rehab Orientation Note Patient arrived today in Cardiac and Pulmonary Rehab for orientation to Pulmonary Rehab. She was brought to orientation by her sister and dropped off at the [facility] entrance, and walked approximately 100 feet. She does carry portable oxygen. Per pt, she uses oxygen continuously. Color good, skin warm and dry. Patient is oriented to time and place. Patient's medical history and medications reviewed. Heart rate is normal, breath sounds clear to auscultation, no wheezes, rales, or rhonchi, diminished. Grip strength equal, strong. Distal pulses 1+ posterior tibial present bilaterally. Patient reports she does take medications as prescribed. Patient states she follows a Regular diet, but does watch her carbohydrate intake. The patient reports no specific efforts to gain or lose weight.  Patient's weight will be monitored closely. Demonstration and practice of PLB using pulse oximeter. Patient able to return demonstration satisfactorily. Safety and hand hygiene in the exercise area reviewed with patient. Patient voices understanding of the information reviewed. Department expectations discussed with patient and achievable goals were set. The patient shows enthusiasm about attending the program and we look forward to working with this nice lady. The patient is scheduled for a 6 min walk test on Tuesday, Jul 05, 2013 in her 1:30pm class and to begin exercise on the same day.   [id number]

## 2013-07-05 ENCOUNTER — Telehealth (HOSPITAL_COMMUNITY): Payer: Self-pay | Admitting: *Deleted

## 2013-07-05 ENCOUNTER — Ambulatory Visit (HOSPITAL_COMMUNITY): Payer: Medicare Other

## 2013-07-05 ENCOUNTER — Encounter (HOSPITAL_COMMUNITY)
Admission: RE | Admit: 2013-07-05 | Discharge: 2013-07-05 | Disposition: A | Discharge status: Home / Self Care | Payer: Medicare Other | Source: Ambulatory Visit | Attending: Internal Medicine | Admitting: Internal Medicine

## 2013-07-05 DIAGNOSIS — J438 Other emphysema: Secondary | ICD-10-CM | POA: Diagnosis not present

## 2013-07-05 NOTE — Progress Notes (Signed)
[name] completed a Six-Minute Walk Test on 07/05/2013 . [name] walked 700 feet with 1 breaks.  The patient's lowest oxygen saturation was 91 , highest heart rate was 106 , and highest blood pressure was 138/62. The patient was on 1 liters of oxygen with a nasal cannula. [name] stated that she felt as if she was about to stumble, she had back pain, and her legs and arms were shaking hindered their walk test.

## 2013-07-07 ENCOUNTER — Ambulatory Visit (HOSPITAL_COMMUNITY): Payer: Medicare Other

## 2013-07-07 ENCOUNTER — Encounter (HOSPITAL_COMMUNITY)
Admission: RE | Admit: 2013-07-07 | Discharge: 2013-07-07 | Disposition: A | Discharge status: Home / Self Care | Payer: Medicare Other | Source: Ambulatory Visit | Attending: Internal Medicine | Admitting: Internal Medicine

## 2013-07-07 DIAGNOSIS — J438 Other emphysema: Secondary | ICD-10-CM | POA: Diagnosis not present

## 2013-07-07 NOTE — Progress Notes (Signed)
Today, [name] exercised at [facility]. Service time was from 1330 to 1530.  The patient exercised for more than 31 minutes performing aerobic, strengthening, and stretching exercises. Oxygen saturation, heart rate, blood pressure, rate of perceived exertion, and shortness of breath were all monitored before, during, and after exercise. [name] presented with no problems at today's exercise session. [name] also attended an education session on the s/s of infection.  There was no workload change during today's exercise session.  Pre-exercise vitals:   Weight kg: 90.1   Liters of O2: 1L   SpO2: 95   HR: 84   BP: 126/62   CBG: 244  Exercise vitals:   Highest heartrate:  92   Lowest oxygen saturation: 94   Highest blood pressure: 132/70   Liters of 02: 1L  Post-exercise vitals:   SpO2: 96   HR: 84   BP: 124/86   Liters of O2: 1L   CBG: 190  Dr. Brand Males, Medical Director Dr. Aileen Fass is immediately available during today's Pulmonary Rehab session for [name] on 07/07/2013 at 1330 class time.

## 2013-07-11 ENCOUNTER — Ambulatory Visit
Admission: RE | Admit: 2013-07-11 | Discharge: 2013-07-11 | Disposition: A | Discharge status: Home / Self Care | Payer: Medicare Other | Source: Ambulatory Visit | Attending: Internal Medicine | Admitting: Internal Medicine

## 2013-07-11 DIAGNOSIS — Z0000 Encounter for general adult medical examination without abnormal findings: Secondary | ICD-10-CM

## 2013-07-12 ENCOUNTER — Ambulatory Visit (HOSPITAL_COMMUNITY): Payer: Medicare Other

## 2013-07-12 ENCOUNTER — Encounter (HOSPITAL_COMMUNITY)
Admission: RE | Admit: 2013-07-12 | Discharge: 2013-07-12 | Disposition: A | Discharge status: Home / Self Care | Payer: Medicare Other | Source: Ambulatory Visit | Attending: Internal Medicine | Admitting: Internal Medicine

## 2013-07-12 DIAGNOSIS — E785 Hyperlipidemia, unspecified: Secondary | ICD-10-CM | POA: Insufficient documentation

## 2013-07-12 DIAGNOSIS — Z5189 Encounter for other specified aftercare: Secondary | ICD-10-CM | POA: Insufficient documentation

## 2013-07-12 DIAGNOSIS — J438 Other emphysema: Secondary | ICD-10-CM | POA: Insufficient documentation

## 2013-07-12 NOTE — Progress Notes (Signed)
Today, [name] exercised at [facility]. Service time was from 1330 to 1515.  The patient exercised for more than 31 minutes performing aerobic, strengthening, and stretching exercises. Oxygen saturation, heart rate, blood pressure, rate of perceived exertion, and shortness of breath were all monitored before, during, and after exercise. [name] presented with no problems at today's exercise session.   There was no workload change during today's exercise session.  Pre-exercise vitals:   Weight kg: 90.5   Liters of O2: 1   SpO2: 95   HR: 87   BP: 110/60   CBG: 267  Exercise vitals:   Highest heartrate:  95   Lowest oxygen saturation: 94   Highest blood pressure: 116/50   Liters of 02: 1  Post-exercise vitals:   SpO2: 95   HR: 83   BP: 110/60   Liters of O2: 1   CBG: 177 Dr. Brand Males, Medical Director Dr. Aileen Fass is immediately available during today's Pulmonary Rehab session for [name] on 07/12/2013 at 1330 class time.

## 2013-07-14 ENCOUNTER — Ambulatory Visit (HOSPITAL_COMMUNITY): Payer: Medicare Other

## 2013-07-14 ENCOUNTER — Encounter (HOSPITAL_COMMUNITY)
Admission: RE | Admit: 2013-07-14 | Discharge: 2013-07-14 | Disposition: A | Discharge status: Home / Self Care | Payer: Medicare Other | Source: Ambulatory Visit | Attending: Internal Medicine | Admitting: Internal Medicine

## 2013-07-14 NOTE — Progress Notes (Signed)
Today, [name] exercised at [facility]. Service time was from 1330 to 1530.  The patient exercised for more than 31 minutes performing aerobic, strengthening, and stretching exercises. Oxygen saturation, heart rate, blood pressure, rate of perceived exertion, and shortness of breath were all monitored before, during, and after exercise. [name] presented with a CBG of 321, negative for ketones.  She attended the exercise for pulmonary patients class, then rechecked CBG which was down to 261.  She was then allowed to exercise.   There was no workload change during today's exercise session.  Pre-exercise vitals:   Weight kg: 89.4   Liters of O2: 1   SpO2: 93   HR: 90   BP: 130/60   CBG: 321, recheck 45 minutes later 261  Exercise vitals:   Highest heartrate:  84   Lowest oxygen saturation: 90   Highest blood pressure: 128/70   Liters of 02: 1  Post-exercise vitals:   SpO2: 94   HR: 90   BP: 124/62   Liters of O2: 1   CBG: 194 Dr. Brand Males, Medical Director Dr. Dyann Kief is immediately available during today's Pulmonary Rehab session for [name] on 07/14/2013 at 1330 class time.

## 2013-07-18 ENCOUNTER — Ambulatory Visit: Payer: Medicare Other

## 2013-07-19 ENCOUNTER — Ambulatory Visit (HOSPITAL_COMMUNITY): Payer: Medicare Other

## 2013-07-19 ENCOUNTER — Encounter (HOSPITAL_COMMUNITY): Payer: Medicare Other

## 2013-07-21 ENCOUNTER — Telehealth: Payer: Self-pay | Admitting: Dietician

## 2013-07-21 ENCOUNTER — Telehealth (HOSPITAL_COMMUNITY): Payer: Self-pay | Admitting: *Deleted

## 2013-07-21 ENCOUNTER — Ambulatory Visit (HOSPITAL_COMMUNITY): Payer: Medicare Other

## 2013-07-21 ENCOUNTER — Encounter: Payer: Self-pay | Admitting: Dietician

## 2013-07-21 ENCOUNTER — Encounter (HOSPITAL_COMMUNITY): Payer: Medicare Other

## 2013-07-21 NOTE — Telephone Encounter (Signed)
Left pt a message  And also spoke with her that I would be emailing her a new correction scale to be added to her 18 units before meal time three times a day as needed. I asked her to call or email me back how she intends to use this to be sure she understands.

## 2013-07-21 NOTE — Telephone Encounter (Signed)
Patient calls for assistance with blood sugars. Is afraid her high blood sugars will jeopardize her pulmonary rehab which she really likes. Was almost not allowed to participate because of high blood sugar last week- 300 before exercise/class and dropped to 279), they told her not to come this past Tuesday because her sugar was high fasting of 260 and today CBG fasting was 203 but she was in pain and nauseated so did not want to ride scat after taking pain pills. Suggested limiting carbs to 45-50 grams a meal( to match 18 units Humalog), however, patient feels budget prohibits this, brief recall shows reasonable amounts of carbs depending on portions consumed).  Wondering if she should put rehab on hold until she gets better control of her blood sugar Does not think her current correction dose of Humalog is adequate. Is using 2 units for each 50 mg rise starting at 150 mg/dl.  Asks if I can relay this to Dr. Eppie Gibson.  I am happy to assist as needed.

## 2013-07-21 NOTE — Telephone Encounter (Signed)
Please work with [name] to adjust her correction factor gradually upwards.  I would like her to continue the Pulmonary Rehab as this is very important.  Thank You.

## 2013-07-25 ENCOUNTER — Ambulatory Visit (INDEPENDENT_AMBULATORY_CARE_PROVIDER_SITE_OTHER): Payer: Medicare Other | Admitting: Pharmacist

## 2013-07-25 DIAGNOSIS — D7389 Other diseases of spleen: Secondary | ICD-10-CM

## 2013-07-25 DIAGNOSIS — I4891 Unspecified atrial fibrillation: Secondary | ICD-10-CM

## 2013-07-25 DIAGNOSIS — D735 Infarction of spleen: Secondary | ICD-10-CM

## 2013-07-25 DIAGNOSIS — I480 Paroxysmal atrial fibrillation: Secondary | ICD-10-CM

## 2013-07-25 DIAGNOSIS — Z7901 Long term (current) use of anticoagulants: Secondary | ICD-10-CM

## 2013-07-25 LAB — POCT INR: INR: 2.5

## 2013-07-25 NOTE — Patient Instructions (Signed)
Patient instructed to take medications as defined in the Anti-coagulation Track section of this encounter.  Patient instructed to take today's dose.  Patient verbalized understanding of these instructions.    

## 2013-07-25 NOTE — Progress Notes (Signed)
Anti-Coagulation Progress Note  [name] is a 63 y.o. female who is currently on an anti-coagulation regimen.    RECENT RESULTS: Recent results are below, the most recent result is correlated with a dose of 17.5 mg. per week: Lab Results  Component Value Date   INR 2.50 07/25/2013   INR 2.90 06/13/2013   INR 2.50 05/16/2013    ANTI-COAG DOSE: Anticoagulation Dose Instructions as of 07/25/2013     [name] Tue Wed Thu Fri Sat   New Dose 2.5 mg 2.5 mg 2.5 mg 2.5 mg 2.5 mg 2.5 mg 2.5 mg       ANTICOAG SUMMARY: Anticoagulation Episode Summary   Current INR goal 2.0-3.0  Next INR check 08/22/2013  INR from last check 2.50 (07/25/2013)  Weekly max dose   Target end date   [facility] check location [facility]  Preferred lab   Send INR reminders to [place]   Indications  Paroxysmal atrial fibrillation [427.31] Long term (current) use of anticoagulants [V58.61]        Comments       Anticoagulation Care Providers   Provider Role Specialty Phone number   Lelon Perla, MD  Cardiology [phone]      ANTICOAG TODAY: Anticoagulation Summary as of 07/25/2013   INR goal 2.0-3.0  Selected INR 2.50 (07/25/2013)  Next INR check 08/22/2013  Target end date    Indications  Paroxysmal atrial fibrillation [427.31] Long term (current) use of anticoagulants [V58.61]      Anticoagulation Episode Summary   [facility]R check location [facility]   Preferred lab    Send INR reminders to [place]   Comments     Anticoagulation Care Providers   Provider Role Specialty Phone number   Lelon Perla, MD  Cardiology [phone]      PATIENT INSTRUCTIONS: Patient Instructions  Patient instructed to take medications as defined in the Anti-coagulation Track section of this encounter.  Patient instructed to take today's dose.  Patient verbalized understanding of these instructions.       FOLLOW-UP Return in 4 weeks (on 08/22/2013) for  Follow up INR at 0945h.  Jorene Guest, III Pharm.D., CACP

## 2013-07-26 ENCOUNTER — Encounter (HOSPITAL_COMMUNITY)
Admission: RE | Admit: 2013-07-26 | Discharge: 2013-07-26 | Disposition: A | Discharge status: Home / Self Care | Payer: Medicare Other | Source: Ambulatory Visit | Attending: Internal Medicine | Admitting: Internal Medicine

## 2013-07-26 ENCOUNTER — Ambulatory Visit (HOSPITAL_COMMUNITY): Payer: Medicare Other

## 2013-07-26 ENCOUNTER — Other Ambulatory Visit: Payer: Self-pay | Admitting: Internal Medicine

## 2013-07-26 NOTE — Progress Notes (Signed)
Today, Danyia exercised at [facility]. Service time was from 1330 to 1515.  The patient exercised for more than 31 minutes performing aerobic, strengthening, and stretching exercises. Oxygen saturation, heart rate, blood pressure, rate of perceived exertion, and shortness of breath were all monitored before, during, and after exercise. Shey presented with no problems at today's exercise session.   There was no workload change during today's exercise session.  Pre-exercise vitals:   Weight kg: 89.2   Liters of O2: 1   SpO2: 94   HR: 78   BP: 102/58   CBG: 220  Exercise vitals:   Highest heartrate:  84   Lowest oxygen saturation: 93   Highest blood pressure: 140/64   Liters of 02: 1  Post-exercise vitals:   SpO2: 98   HR: 80   BP: 104/60   Liters of O2: 1   CBG: 133 Dr. Brand Males, Medical Director Dr. Aileen Fass is immediately available during today's Pulmonary Rehab session for Donta A Zell on 07/26/2013 at 1330 class time.

## 2013-07-26 NOTE — Progress Notes (Signed)
[name] 63 y.o. female Nutrition Note Spoke with pt. Pt is obese. Pt states she wants to lose wt and is not actively doing anything specific to try and promote wt loss. Pt eats 2-3 meals a day; most prepared at home. Pt c/o poor appetite on admission and reports she "sometimes" still has a poor appetite and only eats 2 meals/d. Pt stated she has difficulty chewing/swallowing sometimes due to not having dentures. Per pt, "I can figure out how to eat anything I want though."  There are some ways the pt can improve her eating habits. Per discussion, pt is in the pre-contemplative state of change. This Probation officer suggested pt try to either eat seafood or a meatless meal more frequently and incorporate more whole grains in her diet. Pt stated "whole wheat bread has more carbs than white wheat." Rational for recommending whole grains discussed.  Pt's Rate Your Plate results reviewed with pt. Pt avoids some salty food; uses canned/ convenience food.  Pt does not add salt to food.  The role of sodium in lung disease reviewed with pt.  Pt is diabetic. Last A1c indicates blood glucose not optimally controlled. Pt checks CBG's at least 3 times a day. Pt does not recall what her fasting CBG's have been recently. Pt reports her Humalog and SSI were increased over the past week due to elevated CBG's. Pt states "it seems to be working." Pt continues to have some trouble with bowel regularity. Importance of fluid consumption for bowel regularity discussed. Pt feels she consumes plenty of water in the form of half caffeinated coffee, decaf diet Pepsi, and eating ice chips. Pt reports financial difficulty buying food. Pt reports she has not applied for food stamps "because I'd only qualify for $10 per month." Pt requires assistance with grocery shopping, which her sister helps her with weekly.  Pt expressed understanding of the information reviewed. Nutrition Diagnosis   Food-and nutrition-related knowledge deficit related  to lack of exposure to information as related to diagnosis of pulmonary disease   Obesity related to excessive energy intake as evidenced by a BMI of 36.0 Nutrition Rx/Est. Daily Nutrition Needs for: ? wt loss 1200-1700 Kcal  80-90 gm protein   1500 mg or less sodium     150-175 gm CHO Nutrition Intervention   Pt's individual nutrition plan and goals reviewed with pt.   Benefits of adopting healthy eating habits discussed when pt's Rate Your Plate reviewed.   Pt to attend the Nutrition and Lung Disease class   Continual client-centered nutrition education by RD, as part of interdisciplinary care. Goal(s) 1. Pt to identify and limit food sources of sodium. 2. Identify food quantities necessary to achieve wt loss of  -2# per week to a goal wt of 174-192 lb (79.0-87.2 kg) at graduation from pulmonary rehab. 3. Describe the benefit of including fruits, vegetables, whole grains, and low-fat dairy products in a healthy meal plan. 4. CBG's in the normal range or as close to normal as is safely possible. Monitor and Evaluate progress toward nutrition goal with team.   Derek Mound, M.Ed, RD, LDN, CDE 07/26/2013 2:42 PM

## 2013-07-27 NOTE — Telephone Encounter (Signed)
Pt states she does not need this cream. Thanks

## 2013-07-27 NOTE — Telephone Encounter (Signed)
This medication has been discontinued.  Please call the patient to confirm she requires it again and please let me know for what reason.  Thanks.

## 2013-07-28 ENCOUNTER — Ambulatory Visit (HOSPITAL_COMMUNITY): Payer: Medicare Other

## 2013-07-28 ENCOUNTER — Encounter (HOSPITAL_COMMUNITY)
Admission: RE | Admit: 2013-07-28 | Discharge: 2013-07-28 | Disposition: A | Discharge status: Home / Self Care | Payer: Medicare Other | Source: Ambulatory Visit | Attending: Internal Medicine | Admitting: Internal Medicine

## 2013-07-28 ENCOUNTER — Telehealth: Payer: Self-pay | Admitting: Dietician

## 2013-07-28 ENCOUNTER — Other Ambulatory Visit (HOSPITAL_COMMUNITY): Payer: Self-pay | Admitting: Interventional Radiology

## 2013-07-28 DIAGNOSIS — C642 Malignant neoplasm of left kidney, except renal pelvis: Secondary | ICD-10-CM

## 2013-07-28 NOTE — Telephone Encounter (Signed)
Patient reports they are better, getting under 200, but they could be better. She'll bring meter to pulmonary rehab today at 130 to be downloaded.

## 2013-07-28 NOTE — Telephone Encounter (Signed)
Met patient and got her two meters and downloaded them today. Blood sugars are better since she starting using new correction scale: average decreased ~ 10 mg/dl with 10% more blood sugars in target, but still 64% above target of 70-180). She feels she could use a bit more correction. My calculations were in agreement with this - a slight increase (1 unit) in her correction scale. However, she is also seeing much better blood sugars (more in the 100s for 8-24 hours) now after she exercises. Patient also reports some recent weight loss. I suggest holding off on further increase for now. Can increase later if needed.  Will put meter download in your box for review.

## 2013-07-28 NOTE — Progress Notes (Signed)
Today, [name] exercised at [facility]. Service time was from 1330 to 1515.  The patient exercised for more than 31 minutes performing aerobic, strengthening, and stretching exercises. Oxygen saturation, heart rate, blood pressure, rate of perceived exertion, and shortness of breath were all monitored before, during, and after exercise. [name] presented with no problems at today's exercise session. [name] also attended an education session on pulmonary medications.  There was a workload change during today's exercise session.  Pre-exercise vitals:   Weight kg: 89.3   Liters of O2: 1L   SpO2: 95   HR: 86   BP: 112/56   CBG: 179  Exercise vitals:   Highest heartrate:  91   Lowest oxygen saturation: 95   Highest blood pressure: 132/60   Liters of 02: 1L  Post-exercise vitals:   SpO2: 96   HR: 85   BP: 110/60   Liters of O2: 1L   CBG: 122  Dr. Brand Males, Medical Director Dr. Dyann Kief is immediately available during today's Pulmonary Rehab session for [name] on 07/28/2013 at 1330 class time.

## 2013-07-30 ENCOUNTER — Encounter: Payer: Self-pay | Admitting: Internal Medicine

## 2013-08-01 ENCOUNTER — Telehealth: Payer: Self-pay | Admitting: *Deleted

## 2013-08-01 DIAGNOSIS — F419 Anxiety disorder, unspecified: Secondary | ICD-10-CM

## 2013-08-01 NOTE — Telephone Encounter (Signed)
Call made to pt after receiving message through My Chart.  Pt states that she has always taken alprazolam 55m tabs 0.568mduring the day and 55m6mab at night, she occasional takes another 0.5mg50mring the day if needed.  She states she has always taken 1.5-2 tabs daily and was getting #60 a mth.  This medication is not covered by pt's insurance, but she had agreed to pay for it out of pocket.  She would like for me to forward request to pcp to have rx changed to #60.  Of note, she has an app[facility] with [facility].  Please advise.GoldDespina Hiddensady6/22/20153:24 PM

## 2013-08-02 ENCOUNTER — Ambulatory Visit (HOSPITAL_COMMUNITY): Payer: Medicare Other

## 2013-08-02 ENCOUNTER — Encounter (HOSPITAL_COMMUNITY)
Admission: RE | Admit: 2013-08-02 | Discharge: 2013-08-02 | Disposition: A | Discharge status: Home / Self Care | Payer: Medicare Other | Source: Ambulatory Visit | Attending: Internal Medicine | Admitting: Internal Medicine

## 2013-08-02 MED ORDER — ALPRAZOLAM 1 MG PO TABS: 0.5000 mg | ORAL_TABLET | Freq: Two times a day (BID) | ORAL | Status: DC | PRN

## 2013-08-02 NOTE — Telephone Encounter (Signed)
Please call in the revised prescription to #60/month. Thank You.

## 2013-08-02 NOTE — Progress Notes (Signed)
Today, Chiyoko exercised at [facility]. Service time was from 1330 to 1500.  The patient exercised for more than 31 minutes performing aerobic, strengthening, and stretching exercises. Oxygen saturation, heart rate, blood pressure, rate of perceived exertion, and shortness of breath were all monitored before, during, and after exercise. Bennie presented with no problems at today's exercise session.   There was no workload change during today's exercise session.  Pre-exercise vitals:   Weight kg: 88.8   Liters of O2: 1L   SpO2: 96   HR: 85   BP: 110/58   CBG: 139  Exercise vitals:   Highest heartrate:  96   Lowest oxygen saturation: 94   Highest blood pressure: 130/62   Liters of 02: 1L  Post-exercise vitals:   SpO2: 96   HR: 92   BP: 104/60   Liters of O2: 1L   CBG: 175  Dr. Brand Males, Medical Director Dr. Dyann Kief is immediately available during today's Pulmonary Rehab session for Floreine A Hult on 08/02/2013  at 1330 class time.

## 2013-08-03 ENCOUNTER — Encounter: Payer: Self-pay | Admitting: Internal Medicine

## 2013-08-03 NOTE — Telephone Encounter (Signed)
New Correction scale to be added to your 18 units of Humalog food coverage before meals   Check blood sugar before each meal. If it is:  less than 150 add no extra Humalog  151- 200 add 3 units Humalog  201- 250 add 6 units Humalog  251- 300 add 9 units Humalog  301 - 350 add 12 units Humalog  351 - 400 add 15 units Humalog  If > 400 add 18 units Humalog and call office, if office is closed call after hours resident for instructions

## 2013-08-04 ENCOUNTER — Encounter (HOSPITAL_COMMUNITY)
Admission: RE | Admit: 2013-08-04 | Discharge: 2013-08-04 | Disposition: A | Discharge status: Home / Self Care | Payer: Medicare Other | Source: Ambulatory Visit | Attending: Internal Medicine | Admitting: Internal Medicine

## 2013-08-04 ENCOUNTER — Ambulatory Visit (HOSPITAL_COMMUNITY): Payer: Medicare Other

## 2013-08-04 NOTE — Progress Notes (Signed)
Today, Isabellah exercised at [facility]. Service time was from 1:30pm to 3:30pm.  The patient exercised for more than 31 minutes performing aerobic, strengthening, and stretching exercises. Oxygen saturation, heart rate, blood pressure, rate of perceived exertion, and shortness of breath were all monitored before, during, and after exercise. Cleopha presented with no problems at today's exercise session. The patient attended Stress and Energy Conservation education class.  There was no workload change during today's exercise session.  Pre-exercise vitals:   Weight kg: 87.9   Liters of O2: 1   SpO2: 96   HR: 80   BP: 122/54   CBG: 161  Exercise vitals:   Highest heartrate:  89   Lowest oxygen saturation: 94   Highest blood pressure: 146/48   Liters of 02: 1  Post-exercise vitals:   SpO2: 94   HR: 74   BP: 108/60   Liters of O2: 1   CBG: 136  Dr. Brand Males, Medical Director Dr. Aileen Fass is immediately available during today's Pulmonary Rehab session for Kiarah A Fehl on 08/04/13 at 1:30pm class time.

## 2013-08-05 ENCOUNTER — Ambulatory Visit (INDEPENDENT_AMBULATORY_CARE_PROVIDER_SITE_OTHER): Payer: Medicare Other | Admitting: Internal Medicine

## 2013-08-05 ENCOUNTER — Encounter: Payer: Self-pay | Admitting: Internal Medicine

## 2013-08-05 ENCOUNTER — Other Ambulatory Visit (HOSPITAL_COMMUNITY)
Admission: RE | Admit: 2013-08-05 | Discharge: 2013-08-05 | Disposition: A | Discharge status: Home / Self Care | Payer: Medicare Other | Source: Ambulatory Visit | Attending: Internal Medicine | Admitting: Internal Medicine

## 2013-08-05 VITALS — BP 131/65 | HR 67 | Temp 98.6°F | Wt 196.2 lb

## 2013-08-05 DIAGNOSIS — E1149 Type 2 diabetes mellitus with other diabetic neurological complication: Secondary | ICD-10-CM

## 2013-08-05 DIAGNOSIS — K5909 Other constipation: Secondary | ICD-10-CM

## 2013-08-05 DIAGNOSIS — E1140 Type 2 diabetes mellitus with diabetic neuropathy, unspecified: Secondary | ICD-10-CM

## 2013-08-05 DIAGNOSIS — Z1151 Encounter for screening for human papillomavirus (HPV): Secondary | ICD-10-CM | POA: Insufficient documentation

## 2013-08-05 DIAGNOSIS — I5032 Chronic diastolic (congestive) heart failure: Secondary | ICD-10-CM

## 2013-08-05 DIAGNOSIS — Z720 Tobacco use: Secondary | ICD-10-CM

## 2013-08-05 DIAGNOSIS — I4891 Unspecified atrial fibrillation: Secondary | ICD-10-CM

## 2013-08-05 DIAGNOSIS — J438 Other emphysema: Secondary | ICD-10-CM

## 2013-08-05 DIAGNOSIS — M545 Low back pain, unspecified: Secondary | ICD-10-CM

## 2013-08-05 DIAGNOSIS — G8929 Other chronic pain: Secondary | ICD-10-CM

## 2013-08-05 DIAGNOSIS — I480 Paroxysmal atrial fibrillation: Secondary | ICD-10-CM

## 2013-08-05 DIAGNOSIS — E1142 Type 2 diabetes mellitus with diabetic polyneuropathy: Secondary | ICD-10-CM

## 2013-08-05 DIAGNOSIS — I509 Heart failure, unspecified: Secondary | ICD-10-CM

## 2013-08-05 DIAGNOSIS — F17200 Nicotine dependence, unspecified, uncomplicated: Secondary | ICD-10-CM

## 2013-08-05 DIAGNOSIS — Z124 Encounter for screening for malignant neoplasm of cervix: Secondary | ICD-10-CM

## 2013-08-05 DIAGNOSIS — J439 Emphysema, unspecified: Secondary | ICD-10-CM

## 2013-08-05 LAB — GLUCOSE, CAPILLARY: Glucose-Capillary: 178 mg/dL — ABNORMAL HIGH (ref 70–99)

## 2013-08-05 LAB — POCT GLYCOSYLATED HEMOGLOBIN (HGB A1C): Hemoglobin A1C: 7.6

## 2013-08-05 MED ORDER — PROBIOTIC PO CAPS: 1.0000 | ORAL_CAPSULE | Freq: Every day | ORAL | Status: DC

## 2013-08-05 NOTE — Assessment & Plan Note (Signed)
Her pulmonary symptomatology remains unchanged on the tiotropium, Advair, and as needed albuterol. She is participating in pulmonary rehabilitation which she absolutely loves. At this point she states she has not noticed any benefits symptomatically from this therapy, but I encouraged her to continue to stick with it as this will likely help improve the quality of her life.

## 2013-08-05 NOTE — Assessment & Plan Note (Signed)
She has had no signs or symptoms consistent with a decompensation of her cardiomyopathy. We will continue the Lasix 80 mg twice daily.

## 2013-08-05 NOTE — Assessment & Plan Note (Signed)
She continues to have her chronic back pain which remains intermittent. It is described as sharp and radiates down both legs, right greater than left. She is on Percocet 1-2 tablets every 6 hours as needed. She's not interested in any changes in this regimen. I'm hopeful that her work through pulmonary rehabilitation will also help in controlling her pain.

## 2013-08-05 NOTE — Assessment & Plan Note (Signed)
Her hemoglobin A1c today is 7.6 which is an improvement from the previous level of 7.9. She recently had change in her correction factor with the help of Ms. Plyler. As this just occurred, the current hemoglobin A1c does not reflect the results of these changes. Therefore, we will continue with her current insulin regimen and correction factor and repeat hemoglobin A1c at the followup visit. A urine microalbumin was checked today and she is otherwise up-to-date on her diabetic health maintenance.

## 2013-08-05 NOTE — Progress Notes (Signed)
   Subjective:    Patient ID: [name], female    DOB: 1950-07-17, 63 y.o.   MRN: [id number]  HPI  Please see the A&P for the status of the pt's chronic medical problems.  Review of Systems  Constitutional: Positive for activity change. Negative for appetite change and unexpected weight change.       Has been more active given Pulmonary Rehabilitation  Respiratory: Positive for shortness of breath. Negative for cough, chest tightness and wheezing.   Cardiovascular: Negative for chest pain, palpitations and leg swelling.  Gastrointestinal: Positive for nausea and constipation. Negative for vomiting and diarrhea.  Genitourinary: Negative for vaginal bleeding, vaginal discharge, genital sores, vaginal pain and pelvic pain.  Musculoskeletal: Positive for arthralgias, back pain and myalgias. Negative for joint swelling.  Skin: Negative for rash and wound.      Objective:   Physical Exam  Nursing note and vitals reviewed. Constitutional: She is oriented to person, place, and time. She appears well-developed and well-nourished. No distress.  HENT:  Head: Normocephalic and atraumatic.  Eyes: Conjunctivae are normal. Right eye exhibits no discharge. Left eye exhibits no discharge. No scleral icterus.  Abdominal: Soft. Bowel sounds are normal. She exhibits no distension. There is no tenderness. There is no rebound and no guarding.  Genitourinary: Vagina normal and uterus normal. Pelvic exam was performed with patient supine. No labial fusion. There is no rash, tenderness, lesion or injury on the right labia. There is no rash, tenderness, lesion or injury on the left labia. Right adnexum displays no mass, no tenderness and no fullness. Left adnexum displays no mass, no tenderness and no fullness. No erythema, tenderness or bleeding around the vagina. No foreign body around the vagina. No signs of injury around the vagina. No vaginal discharge found.  Musculoskeletal: She exhibits no edema and  no tenderness.  Neurological: She is alert and oriented to person, place, and time. She exhibits normal muscle tone.  Skin: Skin is warm and dry. No rash noted. She is not diaphoretic. No erythema.  Psychiatric: She has a normal mood and affect. Her behavior is normal. Judgment and thought content normal.      Assessment & Plan:   Please see problem oriented charting.

## 2013-08-05 NOTE — Assessment & Plan Note (Signed)
>>  ASSESSMENT AND PLAN FOR CHRONIC CHF (CONGESTIVE HEART FAILURE) (HCC) WRITTEN ON 08/05/2013 12:44 PM BY Rocco Serene, MD  She has had no signs or symptoms consistent with a decompensation of her cardiomyopathy. We will continue the Lasix 80 mg twice daily.

## 2013-08-05 NOTE — Patient Instructions (Signed)
It was great to see you again.  1) Please keep thinking about stopping smoking.  It is the best thing you can do for your health.  2) I am proud that you are working with pulmonary rehab.  Keep up the good work.  3) Keep taking your insulin with the new correction factor.  We will check the Hgb A1C at the follow-up visit when this should have had an effect.  4) Keep taking all of your other medications as you are.  5) I will call you next week with the results of the Pap Smear.  I will see you back in 3 months to check your diabetes control and progress on wanting to quit smoking.  We will see you sooner if necessary.

## 2013-08-05 NOTE — Assessment & Plan Note (Signed)
We again discussed the importance of smoking cessation given many of her comorbidities are related to tobacco abuse. She still remains mentally unready to quit and is in the pre-contemplative stage. I urged her to let me know as soon as she is ready and we will restart the nicotine patch. Obviously, this issue will be readdressed at the followup visit.

## 2013-08-05 NOTE — Assessment & Plan Note (Signed)
She is continuing with the Coumadin anticoagulation and is tolerating it well.

## 2013-08-06 LAB — BASIC METABOLIC PANEL WITH GFR
BUN: 24 mg/dL — ABNORMAL HIGH (ref 6–23)
CO2: 29 mEq/L (ref 19–32)
Calcium: 8.9 mg/dL (ref 8.4–10.5)
Chloride: 100 mEq/L (ref 96–112)
Creat: 0.99 mg/dL (ref 0.50–1.10)
GFR, Est African American: 70 mL/min
GFR, Est Non African American: 60 mL/min
Glucose, Bld: 173 mg/dL — ABNORMAL HIGH (ref 70–99)
Potassium: 4 mEq/L (ref 3.5–5.3)
Sodium: 139 mEq/L (ref 135–145)

## 2013-08-06 LAB — MICROALBUMIN / CREATININE URINE RATIO
Creatinine, Urine: 167.1 mg/dL
Microalb Creat Ratio: 18 mg/g (ref 0.0–30.0)
Microalb, Ur: 3.01 mg/dL — ABNORMAL HIGH (ref 0.00–1.89)

## 2013-08-08 LAB — CYTOLOGY - PAP

## 2013-08-09 ENCOUNTER — Encounter (HOSPITAL_COMMUNITY): Payer: Medicare Other

## 2013-08-09 ENCOUNTER — Ambulatory Visit (HOSPITAL_COMMUNITY): Payer: Medicare Other

## 2013-08-11 ENCOUNTER — Encounter (HOSPITAL_COMMUNITY): Payer: Medicare Other

## 2013-08-11 ENCOUNTER — Ambulatory Visit (HOSPITAL_COMMUNITY): Payer: Medicare Other

## 2013-08-16 ENCOUNTER — Telehealth (HOSPITAL_COMMUNITY): Payer: Self-pay

## 2013-08-16 ENCOUNTER — Encounter (HOSPITAL_COMMUNITY): Payer: Medicare Other

## 2013-08-16 ENCOUNTER — Telehealth: Payer: Self-pay | Admitting: *Deleted

## 2013-08-16 ENCOUNTER — Ambulatory Visit (HOSPITAL_COMMUNITY): Payer: Medicare Other

## 2013-08-16 DIAGNOSIS — G8929 Other chronic pain: Secondary | ICD-10-CM

## 2013-08-16 DIAGNOSIS — M545 Low back pain, unspecified: Secondary | ICD-10-CM

## 2013-08-16 MED ORDER — HYDROCODONE-IBUPROFEN 5-200 MG PO TABS: 1.0000 | ORAL_TABLET | Freq: Three times a day (TID) | ORAL | Status: DC | PRN

## 2013-08-16 NOTE — Telephone Encounter (Signed)
I called patient and received an identified voice mail.  I left a message that I had called and I will make attempts to call again over the next 24 hours, but that if I could not successfully address her pain over the phone she may need to be evaluated in the clinic by one of my colleagues.

## 2013-08-16 NOTE — Addendum Note (Signed)
Addended by: [name] D on: 08/16/2013 04:32 PM   Modules accepted: Orders, Medications

## 2013-08-16 NOTE — Telephone Encounter (Signed)
Pt called to let us know she  has missed several pulmonary rehab appointments. She is not going to rehab  because of pain across her body. The pain has increased over the past 2 weeks. She is taking Vicodin 1 - 2 tabs three times a day. Not sleeping well. She does say her pain will improve with the vicodin.  Last visit  6/26 with Dr Eppie Gibson.  Next appointment is 3 months.  Pt offered an appointment in clinic but wants to see Dr Eppie Gibson.  His next appointment is in August. I will call pt back and offer clinic appointment with [facility] doctor.

## 2013-08-16 NOTE — Telephone Encounter (Signed)
I called Ms. Gass and we discussed her pain.  The vicoden is helpful, but she still has pain which limits her activities.  She has used vicoprofen in the past with success but this was stopped secondary to possible bleeding.  Given the quality of life issues associated with her pain she is willing to try it again (benefits may outweigh the risks given her current situation).  I wrote for vicoprofen 5-200 mg 1-2 tablets every 8 hours as needed for pain (disp #180) to replace the vicoden.  She is to continue the omeprazole 40 mg daily and take the vicoprofen with food. We will reassess her pain control on this medication and renew or adjust next month as appropriate.

## 2013-08-18 ENCOUNTER — Ambulatory Visit (HOSPITAL_COMMUNITY): Payer: Medicare Other

## 2013-08-18 ENCOUNTER — Encounter (HOSPITAL_COMMUNITY)
Admission: RE | Admit: 2013-08-18 | Discharge: 2013-08-18 | Disposition: A | Discharge status: Home / Self Care | Payer: Medicare Other | Source: Ambulatory Visit | Attending: Internal Medicine | Admitting: Internal Medicine

## 2013-08-18 DIAGNOSIS — Z5189 Encounter for other specified aftercare: Secondary | ICD-10-CM | POA: Diagnosis not present

## 2013-08-18 DIAGNOSIS — E785 Hyperlipidemia, unspecified: Secondary | ICD-10-CM | POA: Diagnosis not present

## 2013-08-18 DIAGNOSIS — J438 Other emphysema: Secondary | ICD-10-CM | POA: Insufficient documentation

## 2013-08-18 NOTE — Progress Notes (Signed)
I have reviewed a Home Exercise Prescription with [name] . [name] is not currently exercising at home.  The patient was advised to walk 3 days a week for 10 minutes.  [name] and I discussed how to progress their exercise prescription.  The patient stated that their goals were to feel better and to quit smoking.  The patient stated that they understand the exercise prescription.  We reviewed exercise guidelines, target heart rate during exercise, oxygen use, weather, home pulse oximeter, endpoints for exercise, and goals.  Patient is encouraged to come to me with any questions. I will continue to follow up with the patient to assist them with progression and safety.

## 2013-08-18 NOTE — Progress Notes (Signed)
Today, [name] exercised at [facility]. Service time was from 1345 to 1515.  The patient exercised for more than 31 minutes performing aerobic, strengthening, and stretching exercises. Oxygen saturation, heart rate, blood pressure, rate of perceived exertion, and shortness of breath were all monitored before, during, and after exercise. [name]'s CBG dropped to 81 after exercising, she was given ginger ale and a snack and her CBG increased to 121, she felt fine and caught the [employer] bus home.   There was no workload change during today's exercise session.  Pre-exercise vitals:   Weight kg: 88.7   Liters of O2: 1   SpO2: 91   HR: 91   BP: 136/50   CBG: 109  Given Tang before exercise to avoid CBG dropping and she ate 3 peanut butter crackers.  Exercise vitals:   Highest heartrate:  90   Lowest oxygen saturation: 95   Highest blood pressure: 120/60   Liters of 02: 1  Post-exercise vitals:   SpO2: 96   HR: 80   BP: 104/60   Liters of O2: 1   CBG: 81 given ginger ale and after 15 minutes the recheck was 121. Dr. Brand Males, Medical Director Dr. Aileen Fass is immediately available during today's Pulmonary Rehab session for [name] on 08/18/2013 at 1330 class time.

## 2013-08-22 ENCOUNTER — Ambulatory Visit (INDEPENDENT_AMBULATORY_CARE_PROVIDER_SITE_OTHER): Payer: Medicare Other | Admitting: Pharmacist

## 2013-08-22 DIAGNOSIS — I480 Paroxysmal atrial fibrillation: Secondary | ICD-10-CM

## 2013-08-22 DIAGNOSIS — I4891 Unspecified atrial fibrillation: Secondary | ICD-10-CM

## 2013-08-22 DIAGNOSIS — D7389 Other diseases of spleen: Secondary | ICD-10-CM

## 2013-08-22 DIAGNOSIS — D735 Infarction of spleen: Secondary | ICD-10-CM

## 2013-08-22 DIAGNOSIS — Z7901 Long term (current) use of anticoagulants: Secondary | ICD-10-CM

## 2013-08-22 LAB — POCT INR: INR: 2.2

## 2013-08-22 NOTE — Patient Instructions (Signed)
Patient instructed to take medications as defined in the Anti-coagulation Track section of this encounter.  Patient instructed to take today's dose.  Patient verbalized understanding of these instructions.    

## 2013-08-22 NOTE — Progress Notes (Signed)
Anti-Coagulation Progress Note  [name] is a 63 y.o. female who is currently on an anti-coagulation regimen.    RECENT RESULTS: Recent results are below, the most recent result is correlated with a dose of 17.5 mg. per week: Lab Results  Component Value Date   INR 2.20 08/22/2013   INR 2.50 07/25/2013   INR 2.90 06/13/2013    ANTI-COAG DOSE: Anticoagulation Dose Instructions as of 08/22/2013     [name] Tue Wed Thu Fri Sat   New Dose 2.5 mg 2.5 mg 2.5 mg 2.5 mg 2.5 mg 2.5 mg 2.5 mg       ANTICOAG SUMMARY: Anticoagulation Episode Summary   Current INR goal 2.0-3.0  Next INR check 10/03/2013  INR from last check 2.20 (08/22/2013)  Weekly max dose   Target end date   [facility] check location [facility]  Preferred lab   Send INR reminders to [place]   Indications  Paroxysmal atrial fibrillation [427.31] Long term (current) use of anticoagulants [V58.61]        Comments       Anticoagulation Care Providers   Provider Role Specialty Phone number   Lelon Perla, MD  Cardiology [phone]      ANTICOAG TODAY: Anticoagulation Summary as of 08/22/2013   INR goal 2.0-3.0  Selected INR 2.20 (08/22/2013)  Next INR check 10/03/2013  Target end date    Indications  Paroxysmal atrial fibrillation [427.31] Long term (current) use of anticoagulants [V58.61]      Anticoagulation Episode Summary   [facility] check [facility]   Preferred lab    Send INR reminders to [place]   Comments     Anticoagulation Care Providers   Provider Role Specialty Phone number   Lelon Perla, MD  Cardiology [phone]      PATIENT INSTRUCTIONS: Patient Instructions  Patient instructed to take medications as defined in the Anti-coagulation Track section of this encounter.  Patient instructed to take today's dose.  Patient verbalized understanding of these instructions.       FOLLOW-UP Return in 6 weeks (on 10/03/2013) for  Follow up INR at 1000h.  Jorene Guest, III Pharm.D., CACP

## 2013-08-23 ENCOUNTER — Encounter (HOSPITAL_COMMUNITY): Payer: Medicare Other

## 2013-08-23 ENCOUNTER — Ambulatory Visit (HOSPITAL_COMMUNITY): Payer: Medicare Other

## 2013-08-23 ENCOUNTER — Other Ambulatory Visit: Payer: Self-pay | Admitting: Cardiology

## 2013-08-25 ENCOUNTER — Telehealth: Payer: Self-pay | Admitting: *Deleted

## 2013-08-25 ENCOUNTER — Ambulatory Visit (HOSPITAL_COMMUNITY): Payer: Medicare Other

## 2013-08-25 ENCOUNTER — Encounter (HOSPITAL_COMMUNITY)
Admission: RE | Admit: 2013-08-25 | Discharge: 2013-08-25 | Disposition: A | Discharge status: Home / Self Care | Payer: Medicare Other | Source: Ambulatory Visit | Attending: Internal Medicine | Admitting: Internal Medicine

## 2013-08-25 DIAGNOSIS — M545 Low back pain, unspecified: Secondary | ICD-10-CM

## 2013-08-25 DIAGNOSIS — G8929 Other chronic pain: Secondary | ICD-10-CM

## 2013-08-25 DIAGNOSIS — Z5189 Encounter for other specified aftercare: Secondary | ICD-10-CM | POA: Diagnosis not present

## 2013-08-25 NOTE — Telephone Encounter (Signed)
Pt # [id number]

## 2013-08-25 NOTE — Telephone Encounter (Signed)
Pt called and Vicoprofen is on back order. Several pharmacies were called. Unable to find a pharmacy that has them.  So can you write some Rx's for Vicodin 5/325 mg for pt.  This was last filled 6/20.  I will call [facility] and have them destroy the Vicoprofen Rx they have.   [facility] # [phone]

## 2013-08-25 NOTE — Progress Notes (Signed)
Today, [name] exercised at [facility]. Service time was from 1345 to 1530.  The patient exercised for more than 31 minutes performing aerobic, strengthening, and stretching exercises. Oxygen saturation, heart rate, blood pressure, rate of perceived exertion, and shortness of breath were all monitored before, during, and after exercise. [name] presented with no problems at today's exercise session.  Patient attended the nutrition for pulmonary patients class today.  There was no workload change during today's exercise session.  Pre-exercise vitals:   Weight kg: 88.9   Liters of O2: 1   SpO2: 96   HR: 79   BP: 108/68   CBG: 210  Exercise vitals:   Highest heartrate:  85   Lowest oxygen saturation: 95   Highest blood pressure: 112/50   Liters of 02: 1  Post-exercise vitals:   SpO2: 93   HR: 83   BP: 108/60   Liters of O2: 1   CBG: 148 Dr. Brand Males, Medical Director Dr. Aileen Fass is immediately available during today's Pulmonary Rehab session for [name] on 08/25/2013 at 1330 class time.

## 2013-08-26 MED ORDER — HYDROCODONE-ACETAMINOPHEN 5-325 MG PO TABS: 1.0000 | ORAL_TABLET | Freq: Four times a day (QID) | ORAL | Status: DC | PRN

## 2013-08-26 NOTE — Telephone Encounter (Signed)
Hydrocodone-acetaminophen prescription 1-2 tablets Q8H PRN disp # 180 has been written until back order has been received.  Thanks.

## 2013-08-26 NOTE — Telephone Encounter (Signed)
Pharmacy called and they will destroy the Rx for vicoprofen  Pt informed new Rx is ready

## 2013-08-30 ENCOUNTER — Ambulatory Visit (HOSPITAL_COMMUNITY): Payer: Medicare Other

## 2013-08-30 ENCOUNTER — Encounter (HOSPITAL_COMMUNITY)
Admission: RE | Admit: 2013-08-30 | Discharge: 2013-08-30 | Disposition: A | Discharge status: Home / Self Care | Payer: Medicare Other | Source: Ambulatory Visit | Attending: Internal Medicine | Admitting: Internal Medicine

## 2013-08-30 DIAGNOSIS — Z5189 Encounter for other specified aftercare: Secondary | ICD-10-CM | POA: Diagnosis not present

## 2013-08-30 NOTE — Progress Notes (Signed)
Today, Murial exercised at [facility]. Service time was from 1330 to 1515.  The patient exercised for more than 31 minutes performing aerobic, strengthening, and stretching exercises. Oxygen saturation, heart rate, blood pressure, rate of perceived exertion, and shortness of breath were all monitored before, during, and after exercise. Milan presented with no problems at today's exercise session.   There was a workload change during today's exercise session.  Pre-exercise vitals:   Weight kg: 89.7   Liters of O2: 1L   SpO2: 94   HR: 87   BP: 98/52   CBG: 102  Exercise vitals:   Highest heartrate:  92   Lowest oxygen saturation: 93   Highest blood pressure: 106/60   Liters of 02: 1L  Post-exercise vitals:   SpO2: 94   HR: 83   BP: 124/60   Liters of O2: 1L   CBG: 124  Dr. Brand Males, Medical Director Dr. Maryland Pink is immediately available during today's Pulmonary Rehab session for Anniya A Smart on 08/30/2013 at 1330 class time.

## 2013-09-01 ENCOUNTER — Ambulatory Visit (HOSPITAL_COMMUNITY): Payer: Medicare Other

## 2013-09-01 ENCOUNTER — Encounter (HOSPITAL_COMMUNITY): Payer: Medicare Other

## 2013-09-02 ENCOUNTER — Encounter: Payer: Self-pay | Admitting: Internal Medicine

## 2013-09-06 ENCOUNTER — Ambulatory Visit (HOSPITAL_COMMUNITY): Payer: Medicare Other

## 2013-09-06 ENCOUNTER — Encounter (HOSPITAL_COMMUNITY)
Admission: RE | Admit: 2013-09-06 | Discharge: 2013-09-06 | Disposition: A | Discharge status: Home / Self Care | Payer: Medicare Other | Source: Ambulatory Visit | Attending: Internal Medicine | Admitting: Internal Medicine

## 2013-09-06 DIAGNOSIS — Z5189 Encounter for other specified aftercare: Secondary | ICD-10-CM | POA: Diagnosis not present

## 2013-09-06 NOTE — Progress Notes (Signed)
Today, Breniyah exercised at [facility]. Service time was from 1330 to 1515.  The patient exercised for more than 31 minutes performing aerobic, strengthening, and stretching exercises. Oxygen saturation, heart rate, blood pressure, rate of perceived exertion, and shortness of breath were all monitored before, during, and after exercise. Zariya presented with no problems at today's exercise session.   There was no workload change during today's exercise session.  Pre-exercise vitals:   Weight kg: 88.4   Liters of O2: 1   SpO2: 94   HR: 84   BP: 102/58   CBG: 107  Exercise vitals:   Highest heartrate:  90   Lowest oxygen saturation: 95   Highest blood pressure: 128/72   Liters of 02: 1  Post-exercise vitals:   SpO2: 95   HR: 80   BP: 124/64   Liters of O2: 1   CBG: 141 Dr. Brand Males, Medical Director Dr. Carles Collet is immediately available during today's Pulmonary Rehab session for Jorgina A Nydam on 09/06/2013 at 1330 class time.

## 2013-09-08 ENCOUNTER — Encounter (HOSPITAL_COMMUNITY)
Admission: RE | Admit: 2013-09-08 | Discharge: 2013-09-08 | Disposition: A | Discharge status: Home / Self Care | Payer: Medicare Other | Source: Ambulatory Visit | Attending: Internal Medicine | Admitting: Internal Medicine

## 2013-09-08 ENCOUNTER — Ambulatory Visit (HOSPITAL_COMMUNITY): Payer: Medicare Other

## 2013-09-08 DIAGNOSIS — Z5189 Encounter for other specified aftercare: Secondary | ICD-10-CM | POA: Diagnosis not present

## 2013-09-08 NOTE — Progress Notes (Signed)
Today, [name] exercised at [facility]. Service time was from 1330 to 1515.  The patient exercised for more than 31 minutes performing aerobic, strengthening, and stretching exercises. Oxygen saturation, heart rate, blood pressure, rate of perceived exertion, and shortness of breath were all monitored before, during, and after exercise. [name] presented with no problems at today's exercise session. [name] also attended an education session on warning signs of illness in the pulmonary patient.  There was no workload change during today's exercise session.  Pre-exercise vitals:   Weight kg: 89.7   Liters of O2: 1L   SpO2: 94   HR: 83   BP: 102/50   CBG: 136  Exercise vitals:   Highest heartrate:  88   Lowest oxygen saturation: 96   Highest blood pressure: 136/60   Liters of 02: 1L  Post-exercise vitals:   SpO2: 98   HR: 76   BP: 120/60   Liters of O2: 1L   CBG: 118  Dr. Brand Males, Medical Director Dr. Coralyn Pear is immediately available during today's Pulmonary Rehab session for [name] on 09/08/2013 at 1330 class time.

## 2013-09-12 IMAGING — CR DG CHEST 2V
2 series · 2 of 2 positions shown · non-contrast
Comparison: 11/13/2010.

CLINICAL DATA: Shortness of breath.

CHEST - 2 VIEW

[w chest pa]
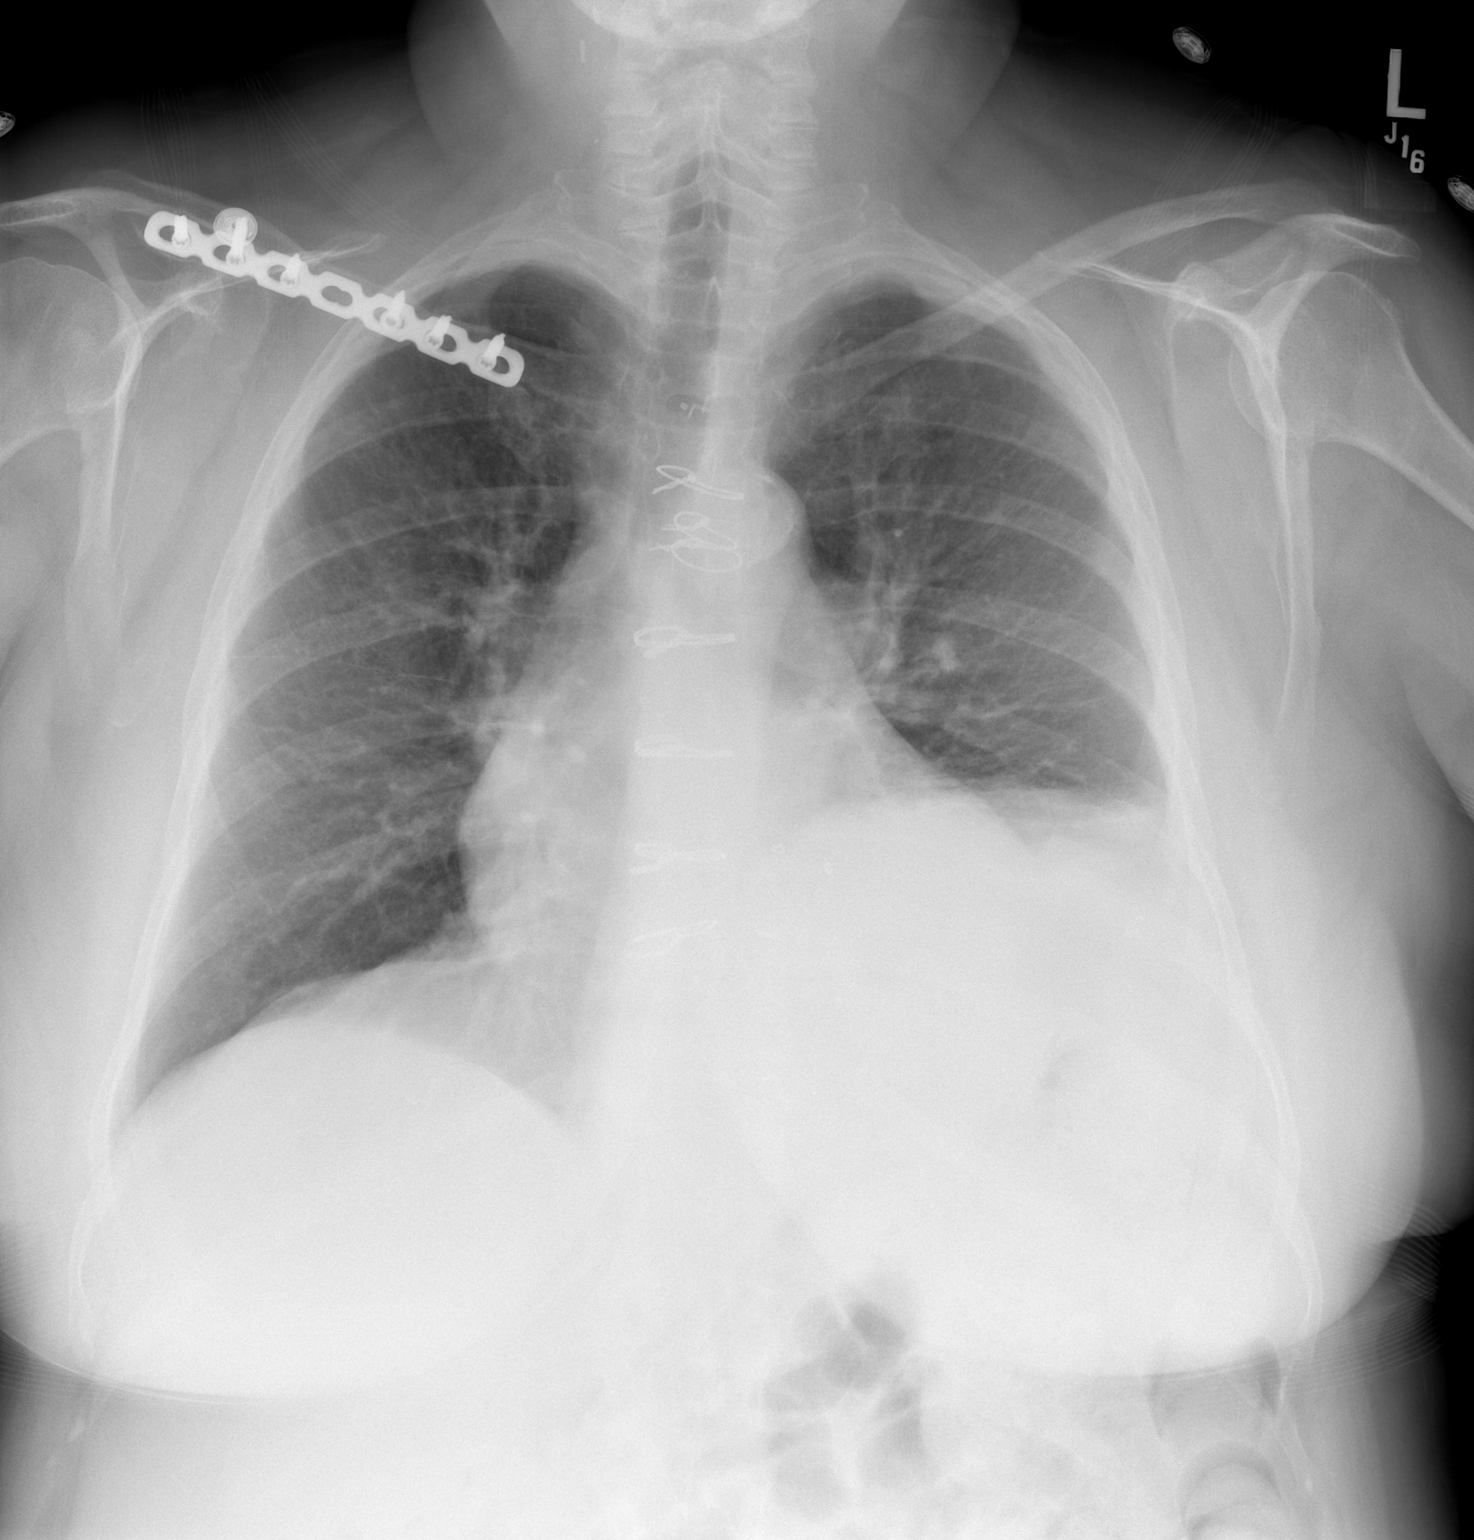

[w chest lat]
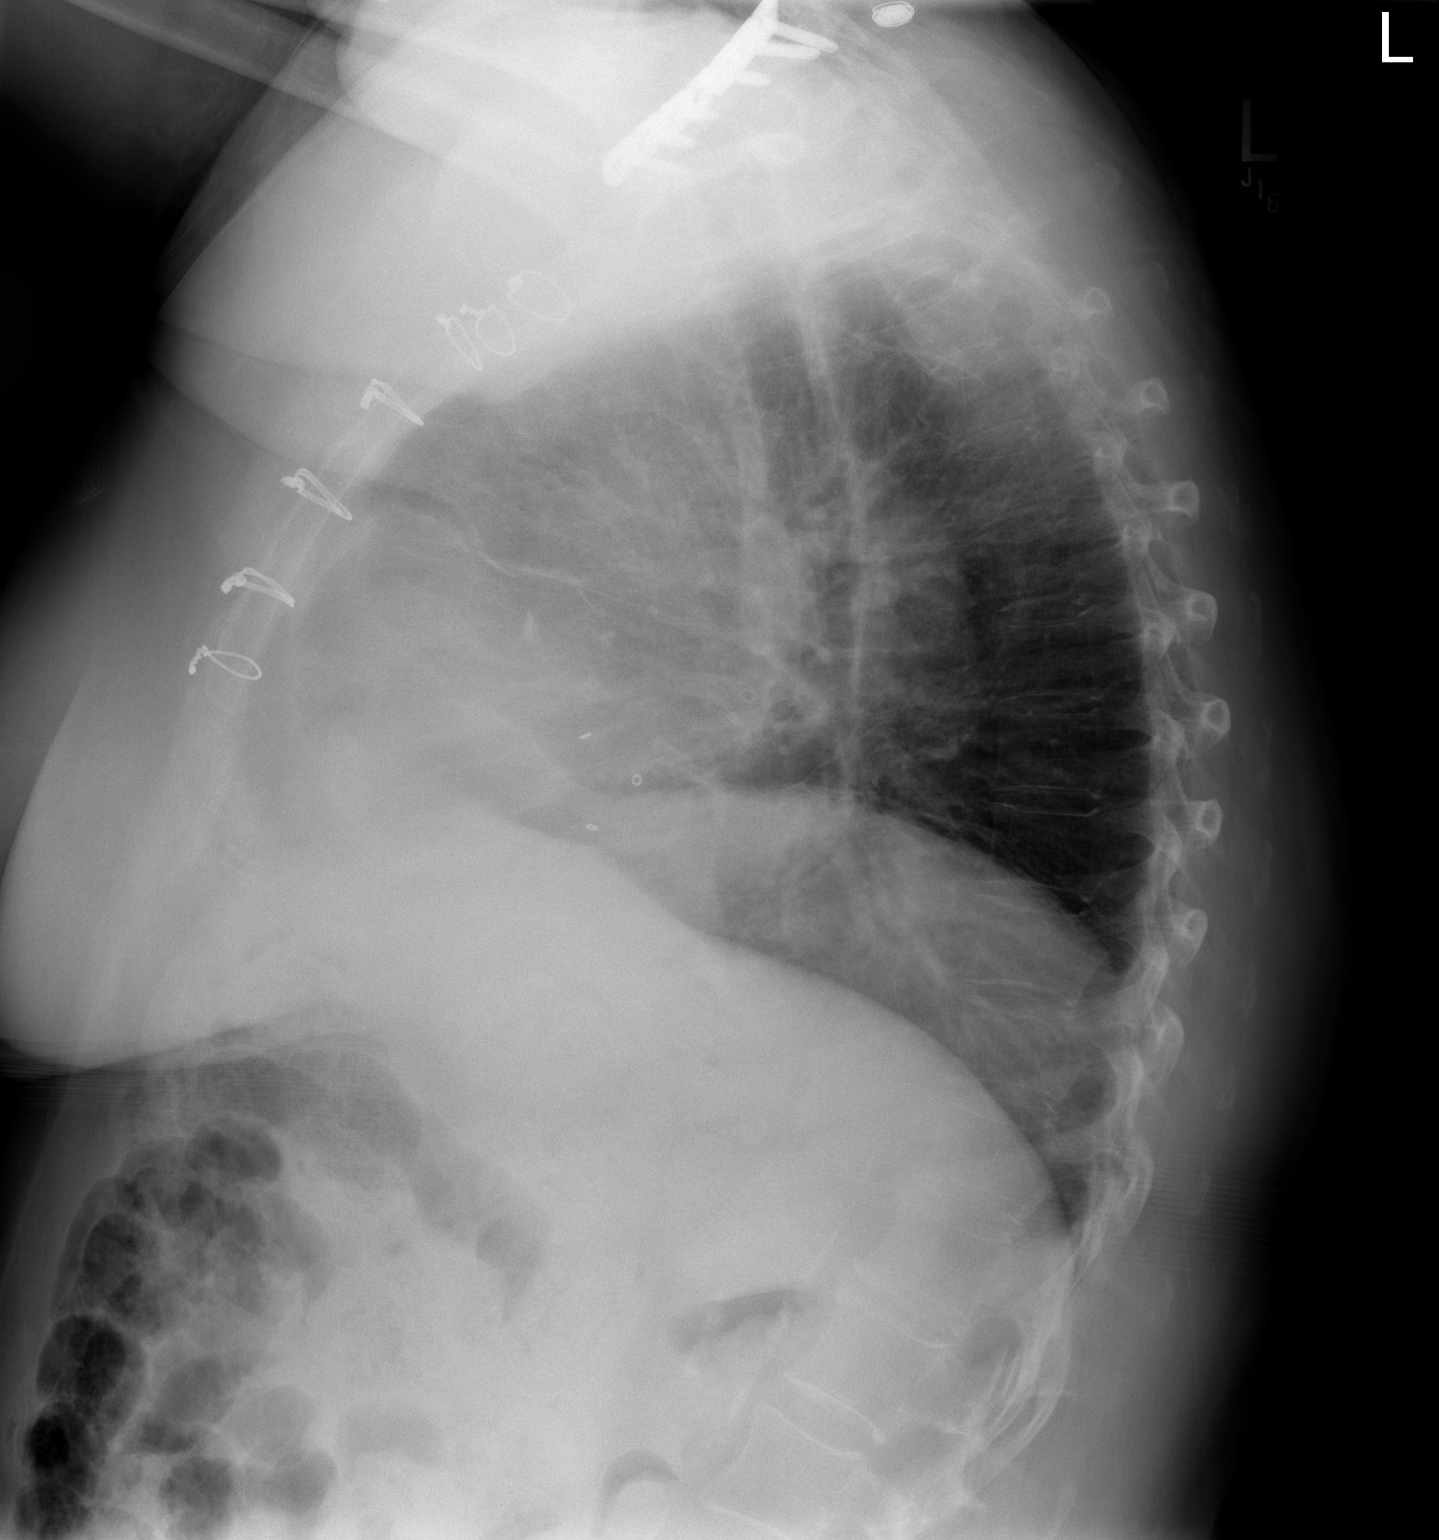

[2 of 2 positions shown; findings below may reference images not displayed]

FINDINGS: The cardiac silhouette, mediastinal and hilar contours
are within normal limits and stable.  Stable mild elevation of the
left hemidiaphragm with overlying atelectasis. No pneumothorax.
IMPRESSION: Persistent elevation of the left hemidiaphragm with overlying
atelectasis.

## 2013-09-13 ENCOUNTER — Other Ambulatory Visit (HOSPITAL_COMMUNITY): Payer: Medicare Other

## 2013-09-13 ENCOUNTER — Encounter (HOSPITAL_COMMUNITY): Payer: Medicare Other

## 2013-09-13 ENCOUNTER — Ambulatory Visit (HOSPITAL_COMMUNITY): Payer: Medicare Other

## 2013-09-13 ENCOUNTER — Ambulatory Visit: Payer: Medicare Other | Admitting: Family

## 2013-09-14 ENCOUNTER — Ambulatory Visit (HOSPITAL_COMMUNITY)
Admission: RE | Admit: 2013-09-14 | Discharge: 2013-09-14 | Disposition: A | Discharge status: Home / Self Care | Payer: Medicare Other | Source: Ambulatory Visit | Attending: Interventional Radiology | Admitting: Interventional Radiology

## 2013-09-14 ENCOUNTER — Other Ambulatory Visit: Payer: Self-pay | Admitting: Internal Medicine

## 2013-09-14 ENCOUNTER — Ambulatory Visit
Admission: RE | Admit: 2013-09-14 | Discharge: 2013-09-14 | Disposition: A | Discharge status: Home / Self Care | Payer: Medicare Other | Source: Ambulatory Visit | Attending: Interventional Radiology | Admitting: Interventional Radiology

## 2013-09-14 ENCOUNTER — Encounter (HOSPITAL_COMMUNITY): Payer: Self-pay

## 2013-09-14 DIAGNOSIS — R109 Unspecified abdominal pain: Secondary | ICD-10-CM | POA: Insufficient documentation

## 2013-09-14 DIAGNOSIS — N281 Cyst of kidney, acquired: Secondary | ICD-10-CM | POA: Insufficient documentation

## 2013-09-14 DIAGNOSIS — C642 Malignant neoplasm of left kidney, except renal pelvis: Secondary | ICD-10-CM

## 2013-09-14 DIAGNOSIS — K5900 Constipation, unspecified: Secondary | ICD-10-CM | POA: Insufficient documentation

## 2013-09-14 DIAGNOSIS — C649 Malignant neoplasm of unspecified kidney, except renal pelvis: Secondary | ICD-10-CM | POA: Diagnosis not present

## 2013-09-14 DIAGNOSIS — I480 Paroxysmal atrial fibrillation: Secondary | ICD-10-CM

## 2013-09-14 DIAGNOSIS — N200 Calculus of kidney: Secondary | ICD-10-CM | POA: Insufficient documentation

## 2013-09-14 MED ORDER — IOHEXOL 300 MG/ML  SOLN
100.0000 mL | Freq: Once | INTRAMUSCULAR | Status: AC | PRN
  Administered 2013-09-14: 100 mL via INTRAVENOUS

## 2013-09-15 ENCOUNTER — Encounter (HOSPITAL_COMMUNITY)
Admission: RE | Admit: 2013-09-15 | Discharge: 2013-09-15 | Disposition: A | Discharge status: Home / Self Care | Payer: Medicare Other | Source: Ambulatory Visit | Attending: Internal Medicine | Admitting: Internal Medicine

## 2013-09-15 ENCOUNTER — Ambulatory Visit (HOSPITAL_COMMUNITY): Payer: Medicare Other

## 2013-09-15 DIAGNOSIS — Z5189 Encounter for other specified aftercare: Secondary | ICD-10-CM | POA: Diagnosis present

## 2013-09-15 DIAGNOSIS — E785 Hyperlipidemia, unspecified: Secondary | ICD-10-CM | POA: Insufficient documentation

## 2013-09-15 DIAGNOSIS — J438 Other emphysema: Secondary | ICD-10-CM | POA: Insufficient documentation

## 2013-09-15 NOTE — Progress Notes (Signed)
Today, Twinkle exercised at [facility] [facility]. Service time was from 1330 to 1515.  The patient exercised for more than 31 minutes performing aerobic, strengthening, and stretching exercises. Oxygen saturation, heart rate, blood pressure, rate of perceived exertion, and shortness of breath were all monitored before, during, and after exercise. Melanee presented with no problems at today's exercise session. Vielka also attended an education session on Advanced Directives.  There was no workload change during today's exercise session.  Pre-exercise vitals:   Weight kg: 89.7   Liters of O2: 1L   SpO2: 96   HR: 78   BP: 106/56   CBG: 155  Exercise vitals:   Highest heartrate:  81   Lowest oxygen saturation: 96   Highest blood pressure: 118/54   Liters of 02: 1L  Post-exercise vitals:   SpO2: 98   HR: 75   BP: 100/62   Liters of O2: 1L   CBG: 125  Dr. Brand Males, Medical Director Dr. Frederic Jericho is immediately available during today's Pulmonary Rehab session for Youa A Reppucci on 09/15/2013 at 1330 class time.

## 2013-09-19 ENCOUNTER — Encounter: Payer: Self-pay | Admitting: *Deleted

## 2013-09-20 ENCOUNTER — Ambulatory Visit (HOSPITAL_COMMUNITY): Payer: Medicare Other

## 2013-09-20 ENCOUNTER — Other Ambulatory Visit: Payer: Self-pay | Admitting: Internal Medicine

## 2013-09-20 ENCOUNTER — Other Ambulatory Visit: Payer: Self-pay | Admitting: *Deleted

## 2013-09-20 ENCOUNTER — Other Ambulatory Visit: Payer: Self-pay | Admitting: Cardiology

## 2013-09-20 ENCOUNTER — Encounter (HOSPITAL_COMMUNITY): Payer: Medicare Other

## 2013-09-20 DIAGNOSIS — B373 Candidiasis of vulva and vagina: Secondary | ICD-10-CM

## 2013-09-20 DIAGNOSIS — F329 Major depressive disorder, single episode, unspecified: Secondary | ICD-10-CM

## 2013-09-20 DIAGNOSIS — I5032 Chronic diastolic (congestive) heart failure: Secondary | ICD-10-CM

## 2013-09-20 DIAGNOSIS — B3731 Acute candidiasis of vulva and vagina: Secondary | ICD-10-CM

## 2013-09-20 DIAGNOSIS — F32A Depression, unspecified: Secondary | ICD-10-CM

## 2013-09-20 MED ORDER — FLUCONAZOLE 150 MG PO TABS: 150.0000 mg | ORAL_TABLET | Freq: Once | ORAL | Status: DC

## 2013-09-20 NOTE — Telephone Encounter (Signed)
Pt calls and states she and dr Eppie Gibson talked about a "brewing" vaginal yeast at her last visit, she states he told her to call if she got worse, she is calling and stating she is itching in the genital area very badly and has the classic signs of vaginal yeast, would like diflucan

## 2013-09-22 ENCOUNTER — Encounter (HOSPITAL_COMMUNITY): Payer: Medicare Other

## 2013-09-22 ENCOUNTER — Ambulatory Visit (HOSPITAL_COMMUNITY): Payer: Medicare Other

## 2013-09-26 ENCOUNTER — Telehealth (HOSPITAL_COMMUNITY): Payer: Self-pay

## 2013-09-26 NOTE — Progress Notes (Signed)
Discharge note: Received call from patient stating she would like to put pulmonary rehab on hold for a while. Verbalized to patient that it would be best if she were discharged from the program and readmitted when she was able to deal with chronic pain and other barriers that were keeping patient from attending rehab on a regular basis. Patient stated her chronic pain, frequently elevated blood sugars and just recently a vaginal yeast infection has kept her from attending. Encouraged patient to seek medical/psycosocial help for depressive symptoms as needed. Patient stated she is sleeping more and more, and feels depressed. Offered counciling but patient declined. PHQ2 score 2. Patient verbalized she would seek counciling as needed. Will follow thru with discharge from pulmonary rehab and re-admit patient when she is ready.

## 2013-09-27 ENCOUNTER — Ambulatory Visit (HOSPITAL_COMMUNITY): Payer: Medicare Other

## 2013-09-27 ENCOUNTER — Encounter (HOSPITAL_COMMUNITY): Admission: RE | Admit: 2013-09-27 | Payer: Medicare Other | Source: Ambulatory Visit

## 2013-09-28 IMAGING — CR DG HIP COMPLETE 2+V*R*
2 series · 2 of 2 positions shown · non-contrast
Comparison: None.

CLINICAL DATA: Status post fall; right hip pain and pelvic pain.

RIGHT HIP - COMPLETE 2+ VIEW

[t pelvis a.p.]
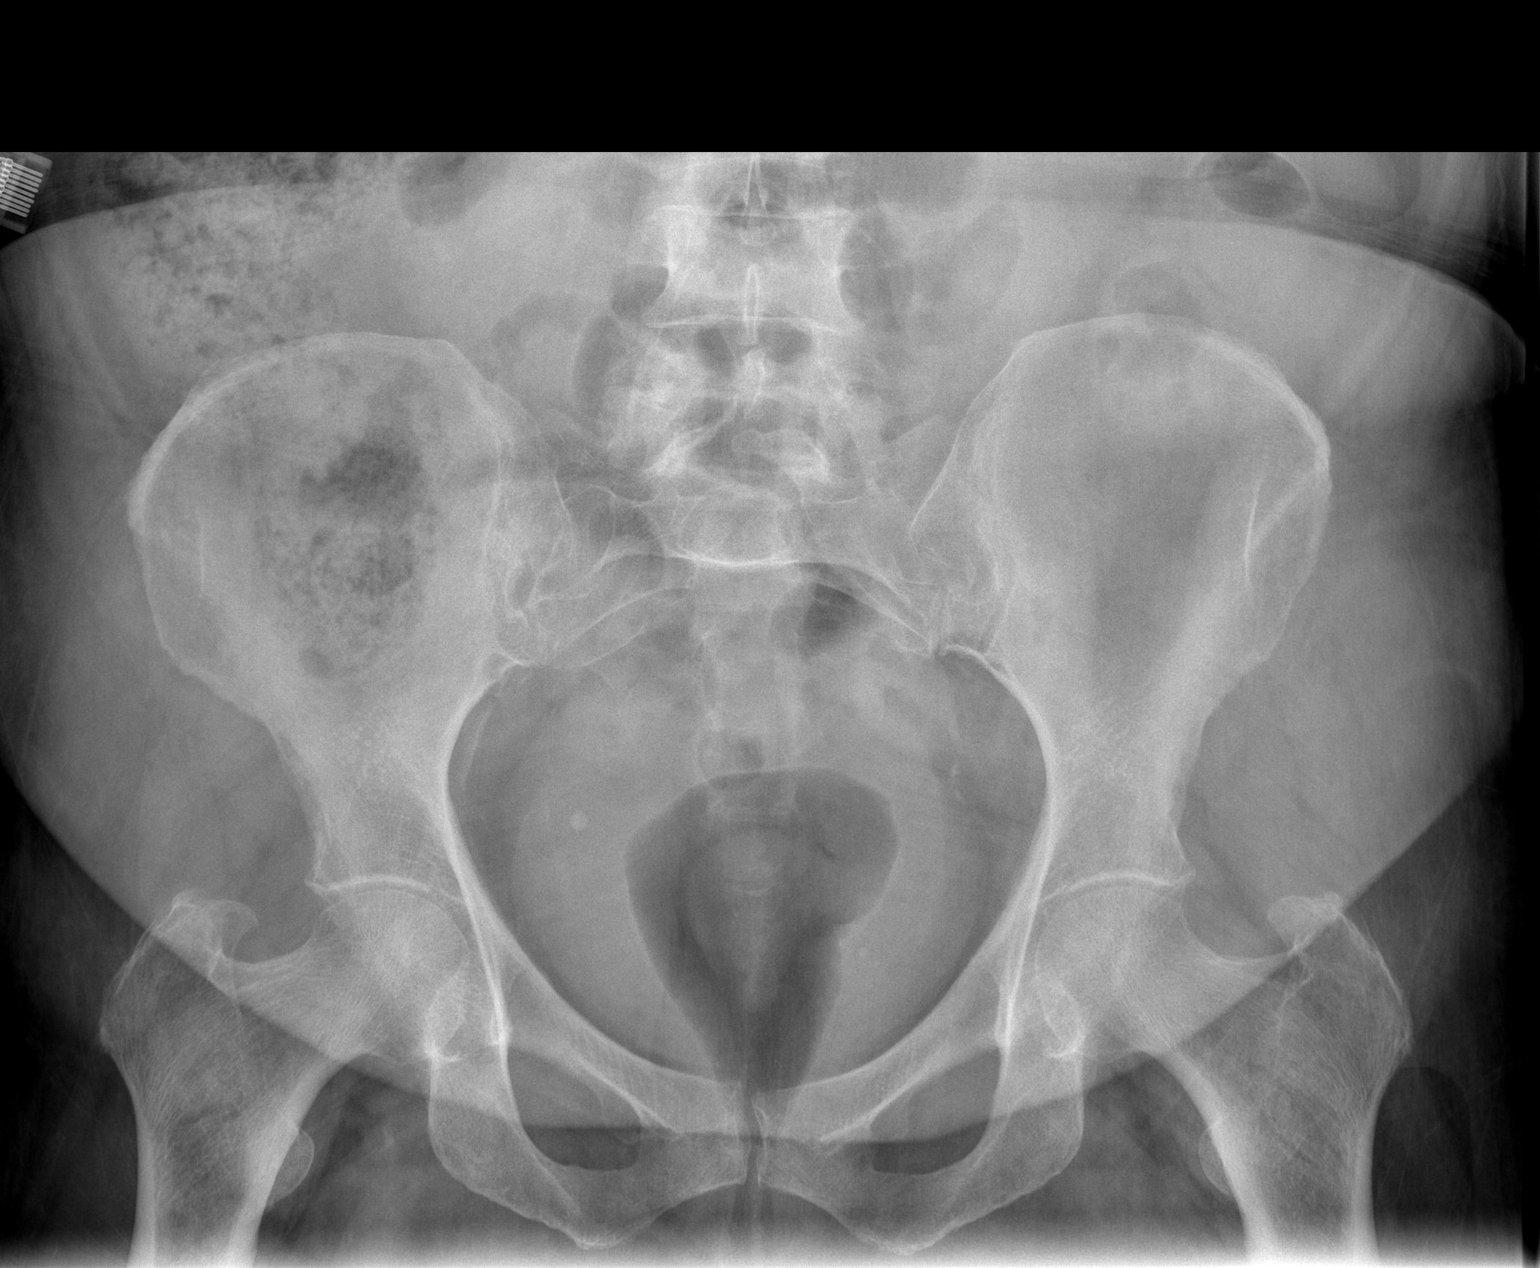

[t hip ap right]
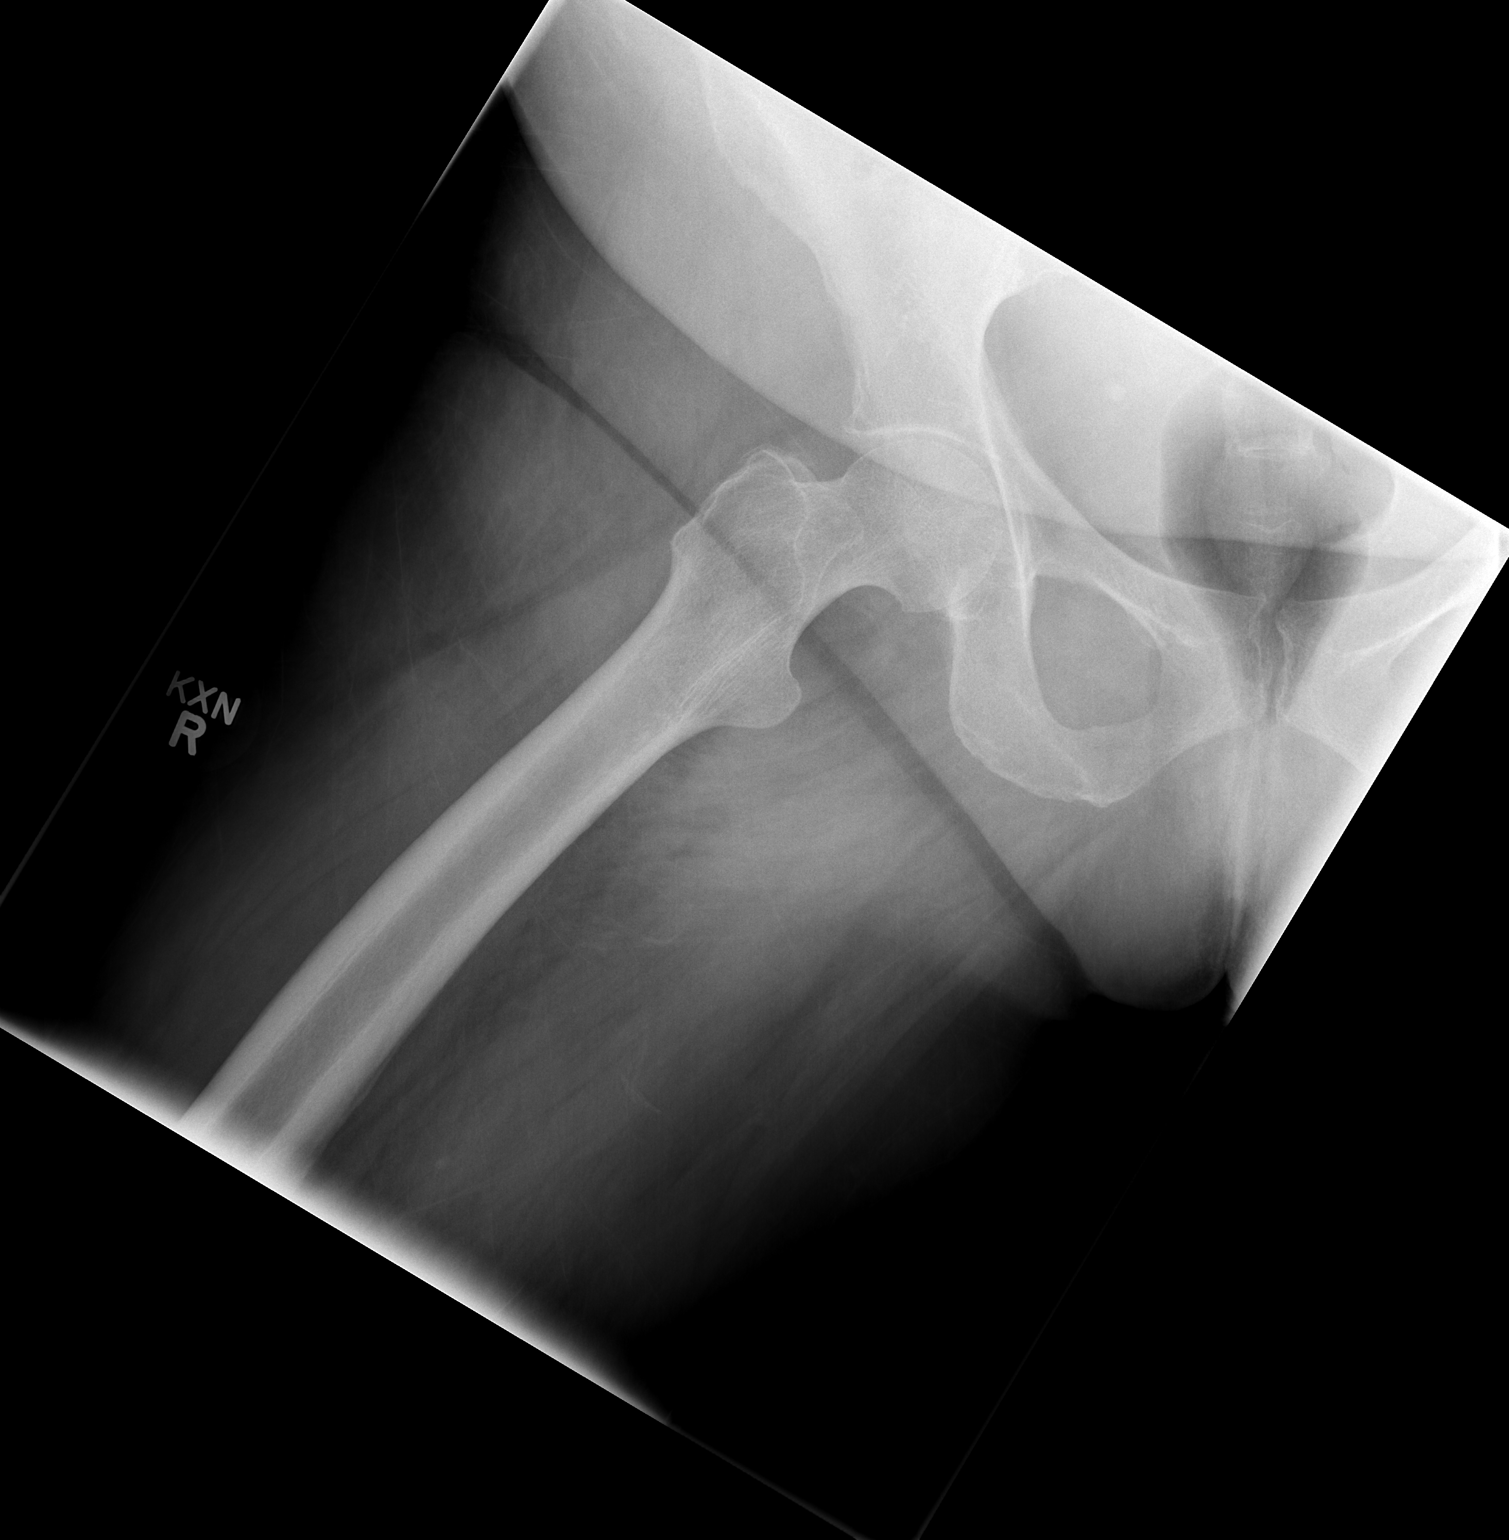

[2 of 2 positions shown; findings below may reference images not displayed]

FINDINGS: There is no evidence of fracture or dislocation.  Both
femoral heads are seated normally within their respective
acetabula.  The proximal right femur appears intact.  No
significant degenerative change is appreciated.  The sacroiliac
joints are unremarkable in appearance.

The visualized bowel gas pattern is grossly unremarkable in
appearance.
IMPRESSION: No evidence of fracture or dislocation.

## 2013-09-28 IMAGING — CT CT CERVICAL SPINE W/O CM
1 of 10 series · 2 of 14 positions shown, 3 images · non-contrast
Comparison: CT of the head performed 09/18/2010, and MRI of the
brain / MRA of the head and neck performed 08/03/2010

CT HEAD

CLINICAL DATA: Status post fall from standing position; head and
neck pain.

CT HEAD WITHOUT CONTRAST AND CT CERVICAL SPINE WITHOUT CONTRAST
TECHNIQUE: Multidetector CT imaging of the head and cervical spine
was performed following the standard protocol without intravenous
contrast.  Multiplanar CT image reconstructions of the cervical
spine were also generated.

[Series 111: orthogonal · axial · 0.31mm/px · z∈[-351,-100]mm · 2 of 68 slices shown, 3 images]
[im 1/68  soft-tissue]
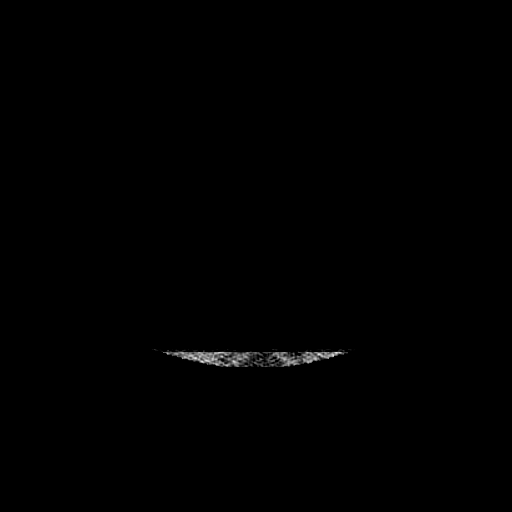
[im 1/68  bone]
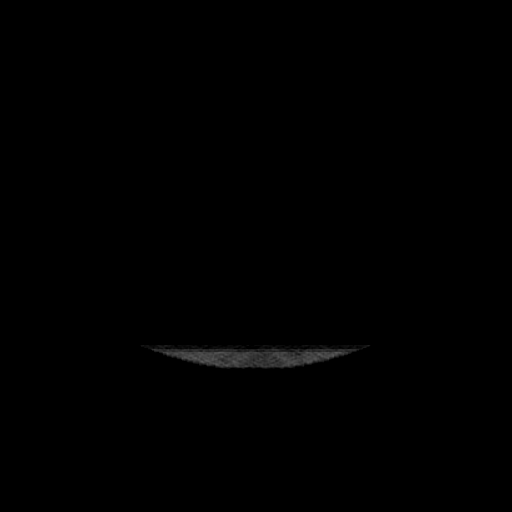
[im 68/68  bone]
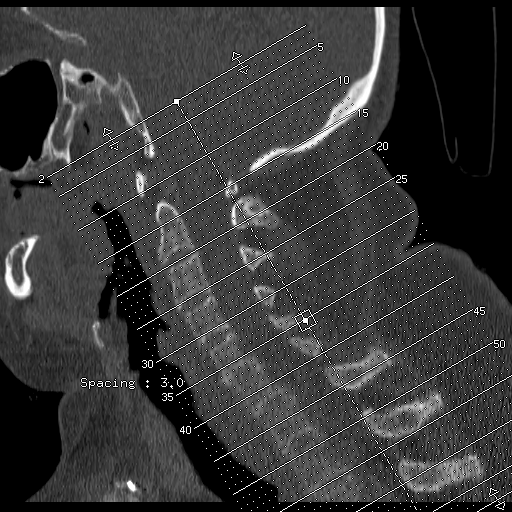

[2 of 14 positions shown; findings below may reference images not displayed]

FINDINGS: There is no evidence of acute infarction, mass lesion, or
intra- or extra-axial hemorrhage on CT.  Evaluation is mildly
suboptimal due to motion artifact.

The posterior fossa, including the cerebellum, brainstem and fourth
ventricle, is within normal limits.  The third and lateral
ventricles, and basal ganglia are unremarkable in appearance.  The
cerebral hemispheres are symmetric in appearance, with normal gray-
white differentiation.  No mass effect or midline shift is seen.

There is no evidence of fracture; visualized osseous structures are
unremarkable in appearance.  The orbits are within normal limits.
The paranasal sinuses and mastoid air cells are well-aerated.  Mild
soft tissue swelling is noted at the left posterior vertex.
IMPRESSION: 1.  No evidence of traumatic intracranial injury or fracture.
2.  Mild soft tissue swelling noted at the left posterior vertex.

CT CERVICAL SPINE
FINDINGS: There is no evidence of fracture or subluxation.
Vertebral bodies demonstrate normal height and alignment.
Intervertebral disc spaces are preserved.  Prevertebral soft
tissues are within normal limits.  The visualized neural foramina
are grossly unremarkable.

The thyroid gland is unremarkable in appearance.  The visualized
lung apices are clear.  No significant soft tissue abnormalities
are seen.
IMPRESSION: No evidence of fracture or subluxation along the cervical spine.

## 2013-09-28 IMAGING — CR DG CHEST 2V
2 series · 2 of 2 positions shown · non-contrast
Comparison: Chest radiograph performed 05/07/2011

CLINICAL DATA: Status post fall; concern for chest injury.

CHEST - 2 VIEW

[w chest pa]
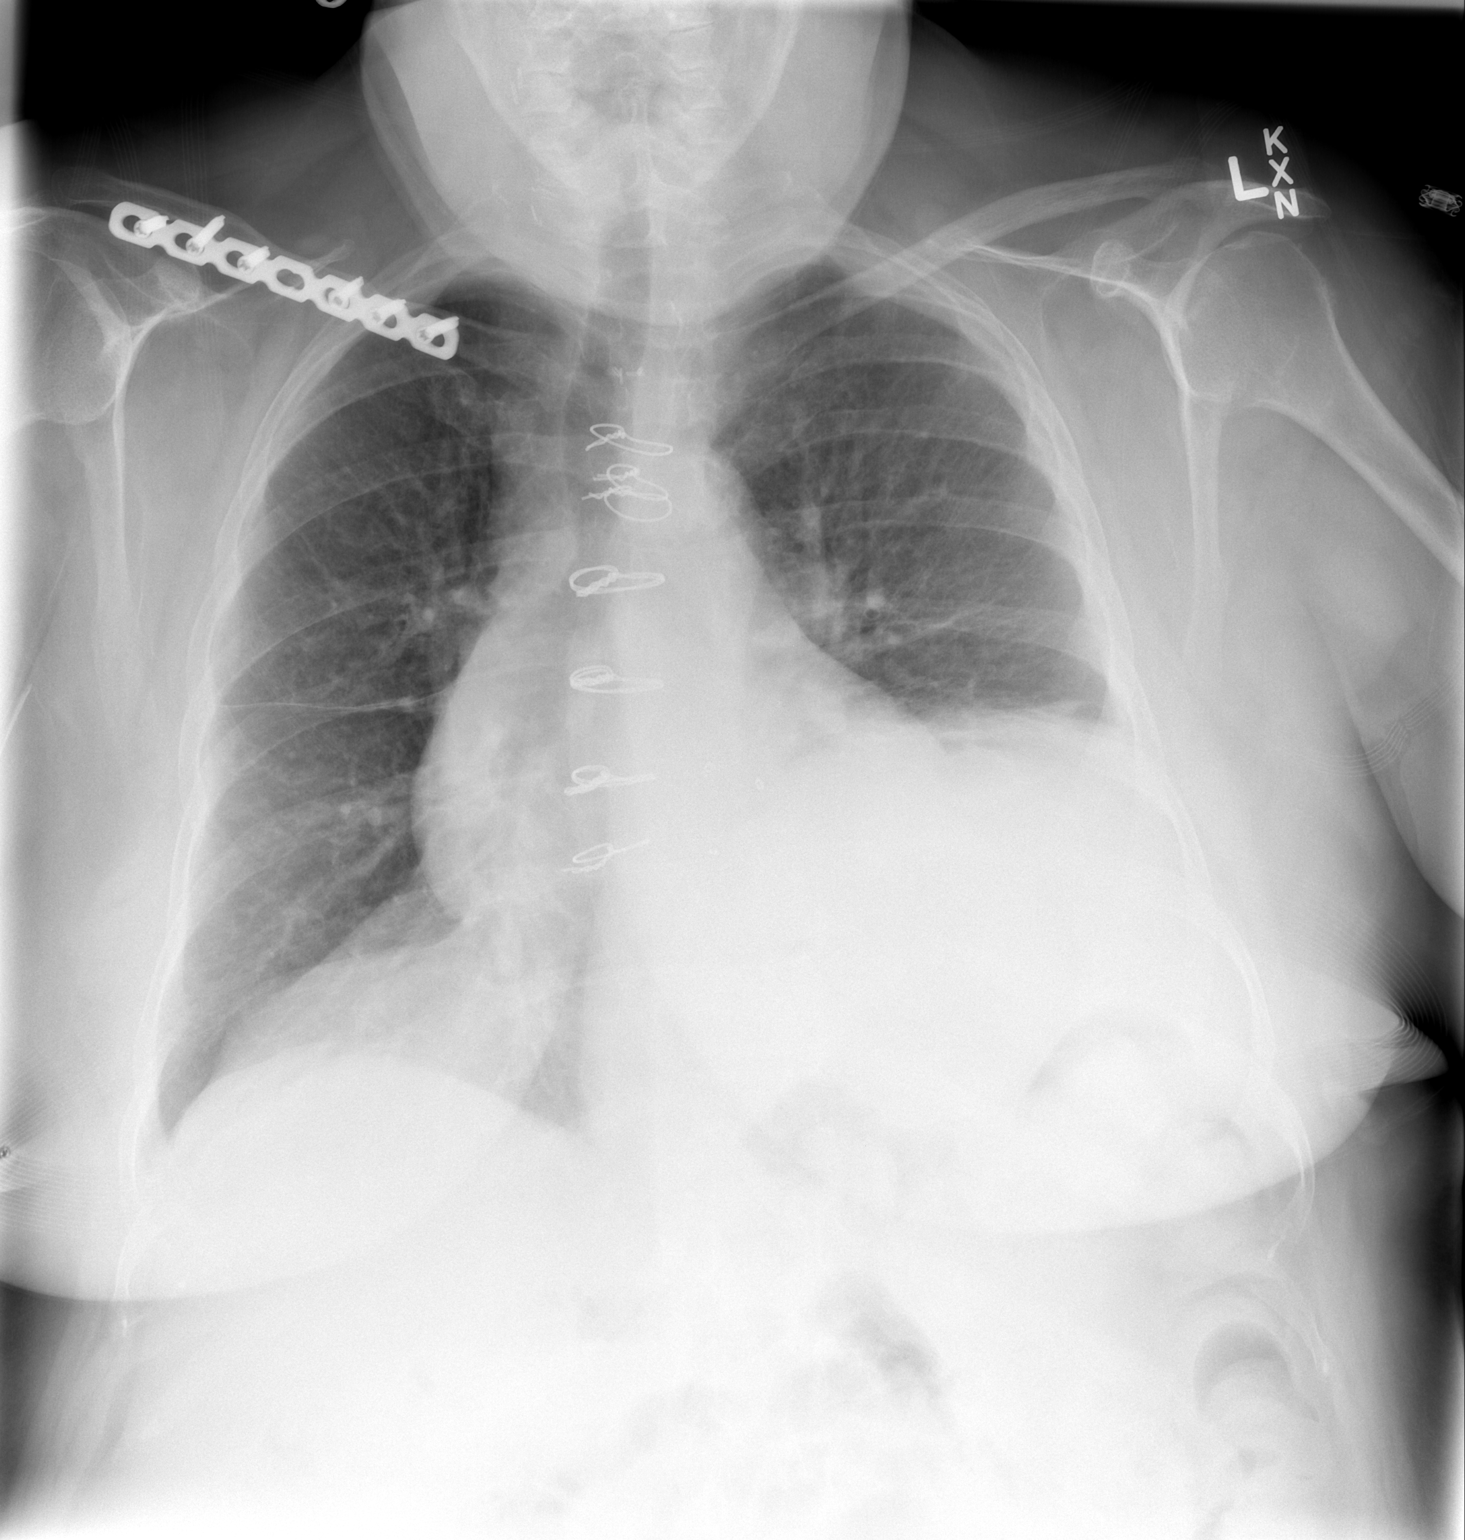

[w chest lat]
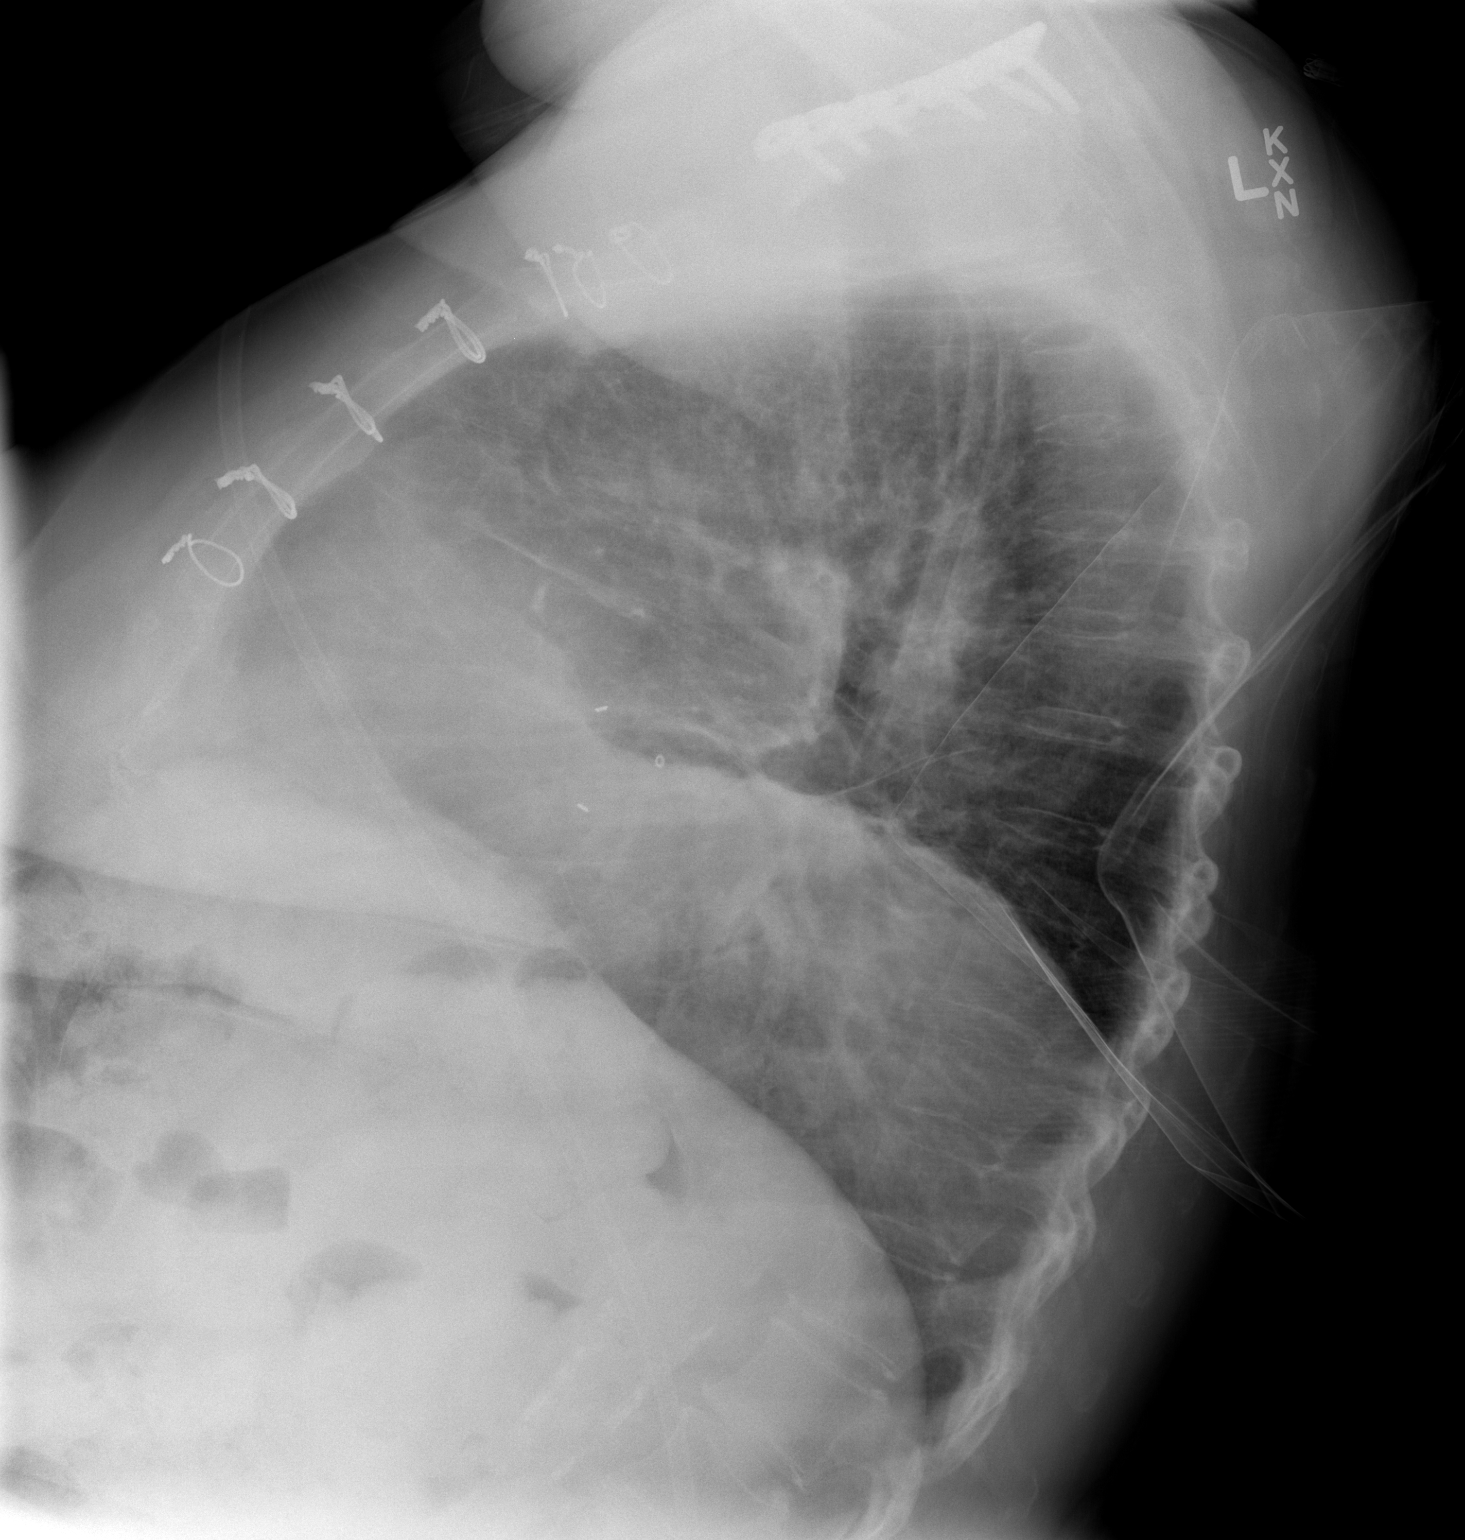

[2 of 2 positions shown; findings below may reference images not displayed]

FINDINGS: There is stable elevation of the left hemidiaphragm.
Left basilar opacity likely reflects persistent atelectasis.  Mild
vascular congestion is noted.  No definite pleural effusion or
pneumothorax is seen.

The heart is borderline normal in size; the patient is status post
median sternotomy.  Hardware is again noted along the right
clavicle.  No acute osseous abnormalities are seen.
IMPRESSION: Left basilar airspace opacity likely reflects persistent
atelectasis; mild vascular congestion noted.  Stable elevation of
the left hemidiaphragm.  No displaced rib fractures seen.

## 2013-09-29 ENCOUNTER — Ambulatory Visit (HOSPITAL_COMMUNITY): Payer: Medicare Other

## 2013-09-29 ENCOUNTER — Encounter (HOSPITAL_COMMUNITY): Payer: Medicare Other

## 2013-09-30 ENCOUNTER — Other Ambulatory Visit: Payer: Self-pay | Admitting: *Deleted

## 2013-09-30 DIAGNOSIS — G8929 Other chronic pain: Secondary | ICD-10-CM

## 2013-09-30 DIAGNOSIS — M545 Low back pain, unspecified: Secondary | ICD-10-CM

## 2013-09-30 MED ORDER — HYDROCODONE-ACETAMINOPHEN 5-325 MG PO TABS: 1.0000 | ORAL_TABLET | Freq: Four times a day (QID) | ORAL | Status: DC | PRN

## 2013-09-30 NOTE — Telephone Encounter (Signed)
Unable to get in contact w/pt; called [facility], stated vicoprofen is still on backorder.

## 2013-09-30 NOTE — Telephone Encounter (Signed)
Please call patient to clarify if she has received the vicoprofen yet or she is asking to pick up the narco if the vicoprofen has yet to be received.  Thanks.

## 2013-09-30 NOTE — Telephone Encounter (Signed)
States she has an appt w/Dr Elie Confer on Monday and would like to pick up rxs . Thanks

## 2013-10-03 ENCOUNTER — Ambulatory Visit (INDEPENDENT_AMBULATORY_CARE_PROVIDER_SITE_OTHER): Payer: Medicare Other | Admitting: Pharmacist

## 2013-10-03 ENCOUNTER — Ambulatory Visit: Payer: Medicare Other | Admitting: Family

## 2013-10-03 ENCOUNTER — Other Ambulatory Visit (HOSPITAL_COMMUNITY): Payer: Medicare Other

## 2013-10-03 DIAGNOSIS — I4891 Unspecified atrial fibrillation: Secondary | ICD-10-CM

## 2013-10-03 DIAGNOSIS — I480 Paroxysmal atrial fibrillation: Secondary | ICD-10-CM

## 2013-10-03 DIAGNOSIS — Z7901 Long term (current) use of anticoagulants: Secondary | ICD-10-CM

## 2013-10-03 LAB — POCT INR: INR: 2.9

## 2013-10-04 ENCOUNTER — Encounter (HOSPITAL_COMMUNITY): Payer: Medicare Other

## 2013-10-04 ENCOUNTER — Ambulatory Visit (HOSPITAL_COMMUNITY): Payer: Medicare Other

## 2013-10-04 NOTE — Progress Notes (Signed)
Anti-Coagulation Progress Note  [name] is a 63 y.o. female who is currently on an anti-coagulation regimen.    RECENT RESULTS: Recent results are below, the most recent result is correlated with a dose of 17.5 mg. per week: Lab Results  Component Value Date   INR 2.90 10/03/2013   INR 2.20 08/22/2013   INR 2.50 07/25/2013    ANTI-COAG DOSE: Anticoagulation Dose Instructions as of 10/03/2013     [name] Tue Wed Thu Fri Sat   New Dose 2.5 mg 2.5 mg 2.5 mg 0 mg 2.5 mg 2.5 mg 2.5 mg       ANTICOAG SUMMARY: Anticoagulation Episode Summary   Current INR goal 2.0-3.0  Next INR check 10/24/2013  INR from last check 2.90 (10/03/2013)  Weekly max dose   Target end date   [facility] check location [facility]  Preferred lab   Send INR reminders to [place]   Indications  Paroxysmal atrial fibrillation [427.31] Long term (current) use of anticoagulants [V58.61]        Comments       Anticoagulation Care Providers   Provider Role Specialty Phone number   Lelon Perla, MD  Cardiology [phone]      ANTICOAG TODAY: Anticoagulation Summary as of 10/03/2013   INR goal 2.0-3.0  Selected INR 2.90 (10/03/2013)  Next INR check 10/24/2013  Target end date    Indications  Paroxysmal atrial fibrillation [427.31] Long term (current) use of anticoagulants [V58.61]      Anticoagulation Episode Summary   [facility] check location [facility]   Preferred lab    Send INR reminders to [place]   Comments     Anticoagulation Care Providers   Provider Role Specialty Phone number   Lelon Perla, MD  Cardiology [phone]      PATIENT INSTRUCTIONS: Patient Instructions  Patient instructed to take medications as defined in the Anti-coagulation Track section of this encounter.  Patient instructed to take today's dose.  Patient verbalized understanding of these instructions.       FOLLOW-UP Return in 3 weeks (on 10/24/2013) for  Follow up INR at 1030h.  Jorene Guest, III Pharm.D., CACP

## 2013-10-04 NOTE — Patient Instructions (Signed)
Patient instructed to take medications as defined in the Anti-coagulation Track section of this encounter.  Patient instructed to take today's dose.  Patient verbalized understanding of these instructions.

## 2013-10-06 ENCOUNTER — Encounter (HOSPITAL_COMMUNITY): Payer: Medicare Other

## 2013-10-06 ENCOUNTER — Ambulatory Visit (HOSPITAL_COMMUNITY): Payer: Medicare Other

## 2013-10-07 ENCOUNTER — Ambulatory Visit (INDEPENDENT_AMBULATORY_CARE_PROVIDER_SITE_OTHER): Payer: Medicare Other | Admitting: Internal Medicine

## 2013-10-07 ENCOUNTER — Encounter: Payer: Self-pay | Admitting: Internal Medicine

## 2013-10-07 VITALS — BP 135/65 | HR 76 | Temp 98.4°F | Wt 195.8 lb

## 2013-10-07 DIAGNOSIS — B373 Candidiasis of vulva and vagina: Secondary | ICD-10-CM

## 2013-10-07 DIAGNOSIS — I4891 Unspecified atrial fibrillation: Secondary | ICD-10-CM

## 2013-10-07 DIAGNOSIS — M545 Low back pain, unspecified: Secondary | ICD-10-CM

## 2013-10-07 DIAGNOSIS — Z23 Encounter for immunization: Secondary | ICD-10-CM

## 2013-10-07 DIAGNOSIS — I480 Paroxysmal atrial fibrillation: Secondary | ICD-10-CM

## 2013-10-07 DIAGNOSIS — F32A Depression, unspecified: Secondary | ICD-10-CM

## 2013-10-07 DIAGNOSIS — E1142 Type 2 diabetes mellitus with diabetic polyneuropathy: Secondary | ICD-10-CM

## 2013-10-07 DIAGNOSIS — K5900 Constipation, unspecified: Secondary | ICD-10-CM

## 2013-10-07 DIAGNOSIS — C649 Malignant neoplasm of unspecified kidney, except renal pelvis: Secondary | ICD-10-CM

## 2013-10-07 DIAGNOSIS — B3731 Acute candidiasis of vulva and vagina: Secondary | ICD-10-CM

## 2013-10-07 DIAGNOSIS — G8929 Other chronic pain: Secondary | ICD-10-CM

## 2013-10-07 DIAGNOSIS — E1149 Type 2 diabetes mellitus with other diabetic neurological complication: Secondary | ICD-10-CM

## 2013-10-07 DIAGNOSIS — C642 Malignant neoplasm of left kidney, except renal pelvis: Secondary | ICD-10-CM

## 2013-10-07 DIAGNOSIS — K5909 Other constipation: Secondary | ICD-10-CM

## 2013-10-07 DIAGNOSIS — F329 Major depressive disorder, single episode, unspecified: Secondary | ICD-10-CM

## 2013-10-07 DIAGNOSIS — F3289 Other specified depressive episodes: Secondary | ICD-10-CM

## 2013-10-07 DIAGNOSIS — Z720 Tobacco use: Secondary | ICD-10-CM

## 2013-10-07 DIAGNOSIS — F17200 Nicotine dependence, unspecified, uncomplicated: Secondary | ICD-10-CM

## 2013-10-07 DIAGNOSIS — E1140 Type 2 diabetes mellitus with diabetic neuropathy, unspecified: Secondary | ICD-10-CM

## 2013-10-07 MED ORDER — SERTRALINE HCL 100 MG PO TABS: 200.0000 mg | ORAL_TABLET | Freq: Every day | ORAL | Status: DC

## 2013-10-07 MED ORDER — INSULIN LISPRO 100 UNIT/ML (KWIKPEN): 18.0000 [IU] | PEN_INJECTOR | SUBCUTANEOUS | Status: DC

## 2013-10-07 MED ORDER — FLUCONAZOLE 150 MG PO TABS: 150.0000 mg | ORAL_TABLET | Freq: Once | ORAL | Status: DC

## 2013-10-07 MED ORDER — WARFARIN SODIUM 5 MG PO TABS: 2.5000 mg | ORAL_TABLET | Freq: Every day | ORAL | Status: DC

## 2013-10-07 MED ORDER — HYDROCODONE-ACETAMINOPHEN 5-325 MG PO TABS: 1.0000 | ORAL_TABLET | Freq: Four times a day (QID) | ORAL | Status: DC | PRN

## 2013-10-07 MED ORDER — INSULIN DETEMIR 100 UNIT/ML FLEXPEN: 40.0000 [IU] | PEN_INJECTOR | Freq: Two times a day (BID) | SUBCUTANEOUS | Status: DC

## 2013-10-07 NOTE — Assessment & Plan Note (Signed)
She states on the daily Colace and probiotic her bowel movements are reasonably regular. She feels no changes are necessary in her regimen at this time.

## 2013-10-07 NOTE — Assessment & Plan Note (Signed)
She reports that a recent visit with her urologist there was no evidence on scanning of recurrent clear cell carcinoma. She will continue to follow with her urologist for surveillance.

## 2013-10-07 NOTE — Assessment & Plan Note (Signed)
She remains asymptomatic and is unsure when she is even in atrial fibrillation. We will continue the warfarin that is being monitored closely by Dr. Elie Confer in the anticoagulation clinic.

## 2013-10-07 NOTE — Assessment & Plan Note (Signed)
We reviewed her meter download. 22% of the readings were within the target range 78% of the readings were above the target range and no readings were below the target range. She also states that she does not have symptomatic hypoglycemic episodes. The average reading in the morning is 224 with a range of 170 and 294. The average reading midday is 224 with a minimum of 125 and a maximum of 328. The average reading in the evening is 192 with a minimum of 115 and a maximum of 276. She is complaining that the Humalog is very painful. She is wondering if she needs to use it 3 times a day given this discomfort. Given the recent study which demonstrated that once a day short acting meal coverage compared to 3 times a day short acting meal coverage had no significant difference in hemoglobin A1c's we decided to back off on the Humalog to once a day in the morning with her current dose and correction factor. We also increased the basal Levemir dose to 40 units twice a day. Unfortunately, we are stuck having to use the Humalog short acting as this is what is covered by her insurance carrier. We recently had to change to Humalog from NovoLog because of a change in the insurance's formulary. I discussed the possibility of changing back to NovoLog but this was not financially feasible. We also had a long discussion about targets for her glucose. The reason for this conversation was that she tends to beat herself up for occasional high sugar levels. I assured her that our goal is around 8.0 with regards to the hemoglobin A1c. It is her cardiopulmonary disease that is more problematic and will likely have a greater impact on her long-term survival then controlling the hemoglobin A1c to 7.0 as compared to 8.0. We will reassess her control with hemoglobin A1c at the followup visit as well as a review of her meter download at that time.

## 2013-10-07 NOTE — Assessment & Plan Note (Signed)
Her chronic low back pain has continued to be problematic. Vicoprofen was not available and we are stuck having to use the Percocet. She has not tried a TENS unit before and we will research the possibility of ordering one. If we can do this I will place the order and reassess her pain after the use of the TENS unit.

## 2013-10-07 NOTE — Assessment & Plan Note (Signed)
She was given the seasonal flu vaccination today. She is otherwise up-to-date on her health maintenance.

## 2013-10-07 NOTE — Patient Instructions (Signed)
It was great to see you again as always!  I am sorry you are still having difficulty with your back pain and other issues we discussed today.  1) I increased the levemir to 40 units twice daily and decreased your Humalog to once a day in the mornings.  2) We increased the Zoloft to 200 mg once daily for your depression.  3)  I will order a TENS unit for your back.  Lets see if this works.  4) Use nystatin cream for the anal area irritation.  5) Keep taking your other medications as you are.  I will see you back in 3 months, sooner if necessary.

## 2013-10-07 NOTE — Assessment & Plan Note (Signed)
We again discussed the importance of tobacco cessation given her underlying oxygen requiring chronic obstructive coronary disease, history of renal cell carcinoma, and vascular disease. She states she knows she needs to quit but has not been motivated to do so as of yet. She has quit for one year in the past and does have 2 boxes of nicotine patches at home. I told her we will continue to discuss this and that she should contact me should she suddenly become ready to make another go at smoking cessation.

## 2013-10-07 NOTE — Assessment & Plan Note (Signed)
Her symptomatic vaginal candidiasis improved on the fluconazole. Unfortunately, she now has some rawness around her anus.  Exam demonstrated a yeast infection that was perianal along with some external hemorrhoids. The hemorrhoids did not look inflamed and I believe that her symptoms around her anus are related to a candidal infection in this area. We will therefore start nystatin cream to the perianal area and assess symptomatic response.

## 2013-10-07 NOTE — Progress Notes (Signed)
   Subjective:    Patient ID: [name], female    DOB: January 18, 1951, 63 y.o.   MRN: [id number]  HPI  Please see the A&P for the status of the pt's chronic medical problems.  Review of Systems  Constitutional: Positive for unexpected weight change.  Eyes: Positive for discharge. Negative for pain, redness and itching.  Respiratory: Positive for shortness of breath. Negative for apnea, cough, choking, chest tightness, wheezing and stridor.   Cardiovascular: Positive for leg swelling. Negative for chest pain and palpitations.  Gastrointestinal: Positive for constipation, abdominal distention and rectal pain. Negative for nausea, abdominal pain and diarrhea.  Genitourinary: Positive for vaginal discharge.  Musculoskeletal: Positive for arthralgias, back pain, gait problem and myalgias. Negative for joint swelling.  Skin: Negative for wound.  Psychiatric/Behavioral: Positive for sleep disturbance, dysphoric mood and decreased concentration. The patient is nervous/anxious.       Objective:   Physical Exam  Nursing note and vitals reviewed. Constitutional: She is oriented to person, place, and time. She appears well-developed and well-nourished. No distress.  HENT:  Head: Normocephalic and atraumatic.  Eyes: Conjunctivae are normal. Right eye exhibits no discharge. Left eye exhibits no discharge. No scleral icterus.  Cardiovascular: Normal rate, regular rhythm and normal heart sounds.  Exam reveals no gallop and no friction rub.   No murmur heard. Pulmonary/Chest: Effort normal. No respiratory distress. She has no wheezes. She has no rales.  Distant breath sounds  Abdominal: Soft. Bowel sounds are normal. She exhibits no distension. There is no tenderness. There is no rebound and no guarding.  Musculoskeletal: Normal range of motion. She exhibits edema. She exhibits no tenderness.  Neurological: She is alert and oriented to person, place, and time. She exhibits normal muscle tone.    Skin: Skin is warm and dry. No rash noted. She is not diaphoretic. No erythema.  Psychiatric: She has a normal mood and affect. Her behavior is normal. Judgment and thought content normal.      Assessment & Plan:   Please see problem oriented charting.

## 2013-10-07 NOTE — Assessment & Plan Note (Signed)
Depression continues to be a problem for her. She does not seem to have the motivation to get out and about and do things that she had previously enjoyed doing. In the past we have talked about increasing her Zoloft dose and she was hesitant. I refloated the idea to her and she is now accepting. We will therefore increase his Zoloft to 200 mg by mouth daily and reassess her depressive symptoms at the followup visit.

## 2013-10-11 ENCOUNTER — Encounter (HOSPITAL_COMMUNITY): Payer: Medicare Other

## 2013-10-11 ENCOUNTER — Ambulatory Visit (HOSPITAL_COMMUNITY): Payer: Medicare Other

## 2013-10-13 ENCOUNTER — Encounter (HOSPITAL_COMMUNITY): Payer: Medicare Other

## 2013-10-13 ENCOUNTER — Ambulatory Visit (HOSPITAL_COMMUNITY): Payer: Medicare Other

## 2013-10-20 ENCOUNTER — Other Ambulatory Visit: Payer: Self-pay | Admitting: *Deleted

## 2013-10-20 DIAGNOSIS — M545 Low back pain, unspecified: Secondary | ICD-10-CM

## 2013-10-20 DIAGNOSIS — G8929 Other chronic pain: Secondary | ICD-10-CM

## 2013-10-20 NOTE — Progress Notes (Signed)
Refferal sent to [place]  [facility] location) for  "T.E.N.S. Unit Evaluation and Dispense as Indicated" .Despina Hidden Cassady9/10/20158:44 AM

## 2013-10-21 ENCOUNTER — Other Ambulatory Visit: Payer: Self-pay | Admitting: Internal Medicine

## 2013-10-21 DIAGNOSIS — B3731 Acute candidiasis of vulva and vagina: Secondary | ICD-10-CM

## 2013-10-21 DIAGNOSIS — J439 Emphysema, unspecified: Secondary | ICD-10-CM

## 2013-10-21 DIAGNOSIS — I480 Paroxysmal atrial fibrillation: Secondary | ICD-10-CM

## 2013-10-21 DIAGNOSIS — B373 Candidiasis of vulva and vagina: Secondary | ICD-10-CM

## 2013-10-21 DIAGNOSIS — E1140 Type 2 diabetes mellitus with diabetic neuropathy, unspecified: Secondary | ICD-10-CM

## 2013-10-21 DIAGNOSIS — I6521 Occlusion and stenosis of right carotid artery: Secondary | ICD-10-CM

## 2013-10-24 ENCOUNTER — Ambulatory Visit (INDEPENDENT_AMBULATORY_CARE_PROVIDER_SITE_OTHER): Payer: Medicare Other | Admitting: Pharmacist

## 2013-10-24 DIAGNOSIS — Z7901 Long term (current) use of anticoagulants: Secondary | ICD-10-CM

## 2013-10-24 DIAGNOSIS — I480 Paroxysmal atrial fibrillation: Secondary | ICD-10-CM

## 2013-10-24 DIAGNOSIS — I4891 Unspecified atrial fibrillation: Secondary | ICD-10-CM

## 2013-10-24 LAB — POCT INR: INR: 2

## 2013-10-24 NOTE — Patient Instructions (Signed)
Patient instructed to take medications as defined in the Anti-coagulation Track section of this encounter.  Patient instructed to take today's dose.  Patient verbalized understanding of these instructions.    

## 2013-10-24 NOTE — Progress Notes (Signed)
Anti-Coagulation Progress Note  [name] is a 63 y.o. female who is currently on an anti-coagulation regimen.    RECENT RESULTS: Recent results are below, the most recent result is correlated with a dose of 15 mg. per week: Lab Results  Component Value Date   INR 2.00 10/24/2013   INR 2.90 10/03/2013   INR 2.20 08/22/2013    ANTI-COAG DOSE: Anticoagulation Dose Instructions as of 10/24/2013     [name] Tue Wed Thu Fri Sat   New Dose 2.5 mg 2.5 mg 2.5 mg 2.5 mg 2.5 mg 2.5 mg 2.5 mg       ANTICOAG SUMMARY: Anticoagulation Episode Summary   Current INR goal 2.0-3.0  Next INR check 11/21/2013  INR from last check 2.00 (10/24/2013)  Weekly max dose   Target end date   [facility]R check location [facility]  Preferred lab   Send INR reminders to [place]   Indications  Paroxysmal atrial fibrillation [427.31] Long term (current) use of anticoagulants [V58.61]        Comments       Anticoagulation Care Providers   Provider Role Specialty Phone number   Lelon Perla, MD  Cardiology [phone]      ANTICOAG TODAY: Anticoagulation Summary as of 10/24/2013   INR goal 2.0-3.0  Selected INR 2.00 (10/24/2013)  Next INR check 11/21/2013  Target end date    Indications  Paroxysmal atrial fibrillation [427.31] Long term (current) use of anticoagulants [V58.61]      Anticoagulation Episode Summary   INR check location [facility]   Preferred lab    Send INR reminders to [place]   Comments     Anticoagulation Care Providers   Provider Role Specialty Phone number   Lelon Perla, MD  Cardiology [phone]      PATIENT INSTRUCTIONS: Patient Instructions  Patient instructed to take medications as defined in the Anti-coagulation Track section of this encounter.  Patient instructed to take today's dose.  Patient verbalized understanding of these instructions.       FOLLOW-UP Return in about 4 weeks (around  11/21/2013) for Follow up INR at 0915h.  Jorene Guest, III Pharm.D., CACP

## 2013-10-25 ENCOUNTER — Telehealth: Payer: Self-pay | Admitting: *Deleted

## 2013-10-25 DIAGNOSIS — E1140 Type 2 diabetes mellitus with diabetic neuropathy, unspecified: Secondary | ICD-10-CM

## 2013-10-25 DIAGNOSIS — R110 Nausea: Secondary | ICD-10-CM

## 2013-10-25 NOTE — Telephone Encounter (Addendum)
Spoke with pt on 10/24/13-she requests   1) Promethazine refill-sent to [place]- takes as needed.  2) Clarification on her Humalog- Pt currently takes 18 units Humalog every morning in addition to correction scale at night for glucoses around 300. One night glucose 390 took 15 units for correction, another night glucose 353-took 12 units for correction.  Does not take correction or glucoses every night, but wanted to make sure that these were the directions previously discussed during last visit.  Pt concerned about running out of medication too soon unless her sig reflects the correction scale.  Will forward to pcp for review.  Please advise.Despina Hidden Cassady9/15/201510:56 AM

## 2013-10-26 MED ORDER — INSULIN LISPRO 100 UNIT/ML (KWIKPEN): 18.0000 [IU] | PEN_INJECTOR | SUBCUTANEOUS | Status: DC

## 2013-10-26 MED ORDER — PROMETHAZINE HCL 12.5 MG PO TABS: 12.5000 mg | ORAL_TABLET | Freq: Four times a day (QID) | ORAL | Status: DC | PRN

## 2013-10-26 NOTE — Telephone Encounter (Signed)
Promethazine refill sent.  Humalog correction was worked out with the Diabetic Educator and I support it.  I have increased the monthly Humalog to 18 mls.  If [name] finds she is running out early she can call and I can adjust it further.  Please call [name] with this information.  Thank You.

## 2013-11-06 IMAGING — US US ABDOMEN COMPLETE
1 series · 13 of 25 positions shown · non-contrast
Comparison: No priors.

CLINICAL DATA: Anemia.  Evaluate for potential liver or kidney
masses.

COMPLETE ABDOMINAL ULTRASOUND

[Series 1: us abdomen complete · 0.43mm/px · 13 of 92 slices shown]
[im 1/92]
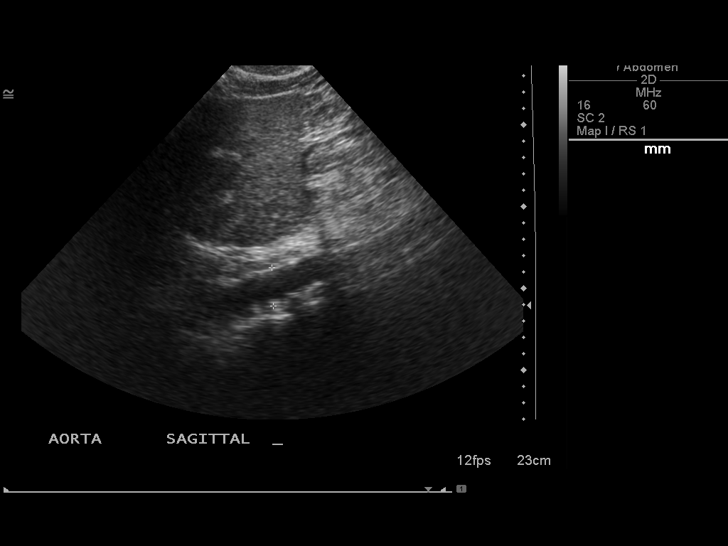
[im 8/92]
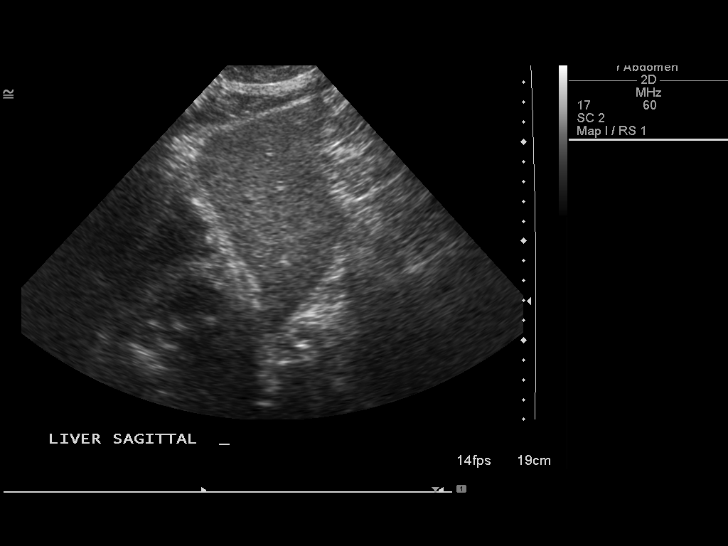
[im 16/92]
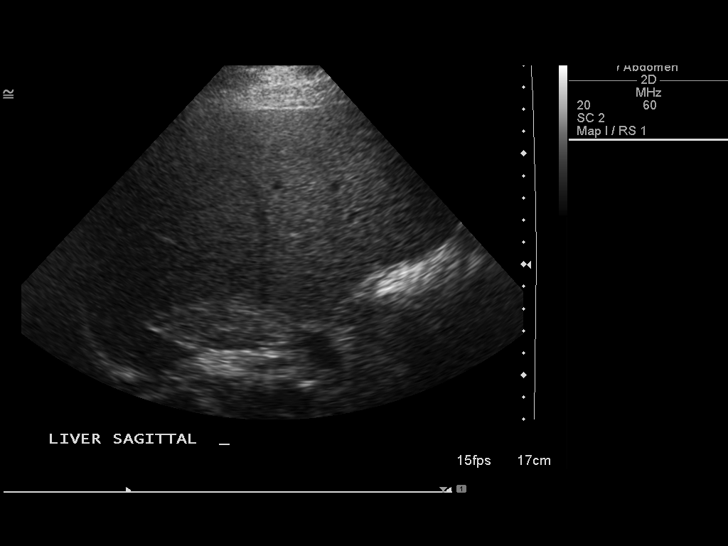
[im 23/92]
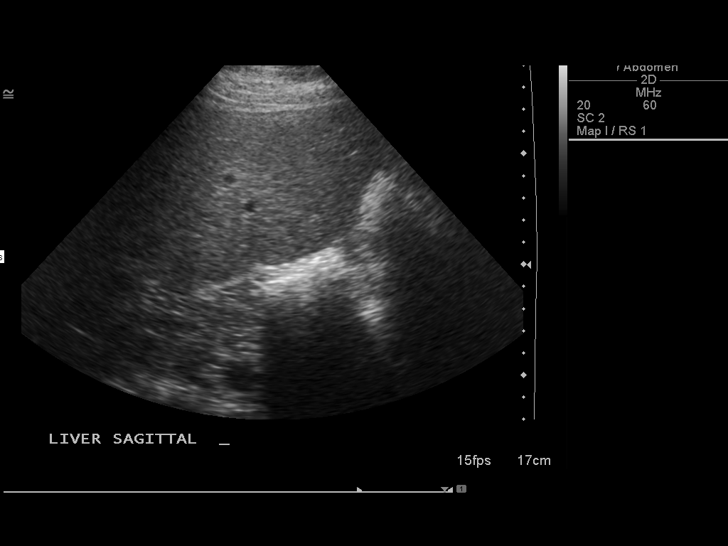
[im 31/92]
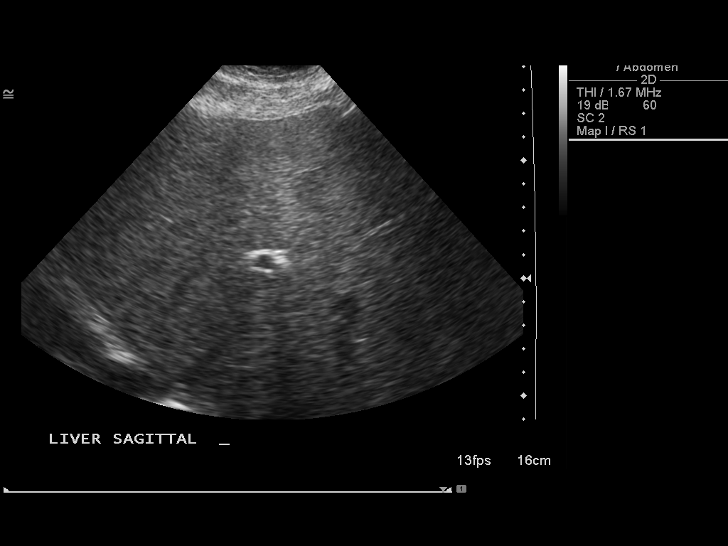
[im 38/92]
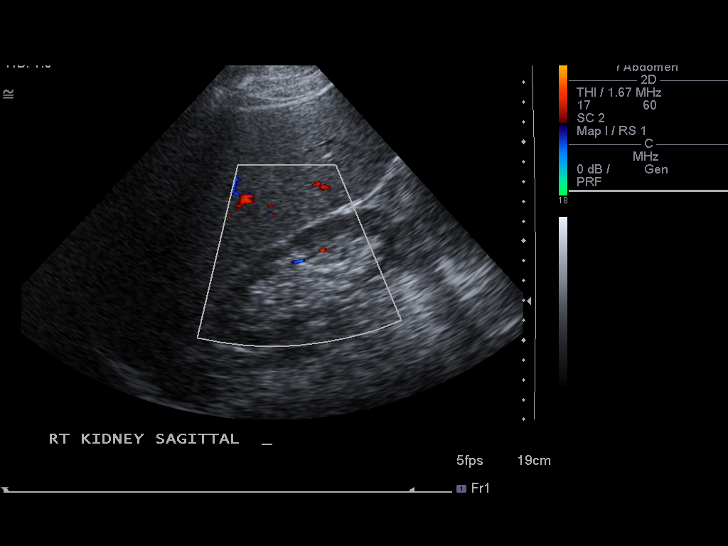
[im 46/92]
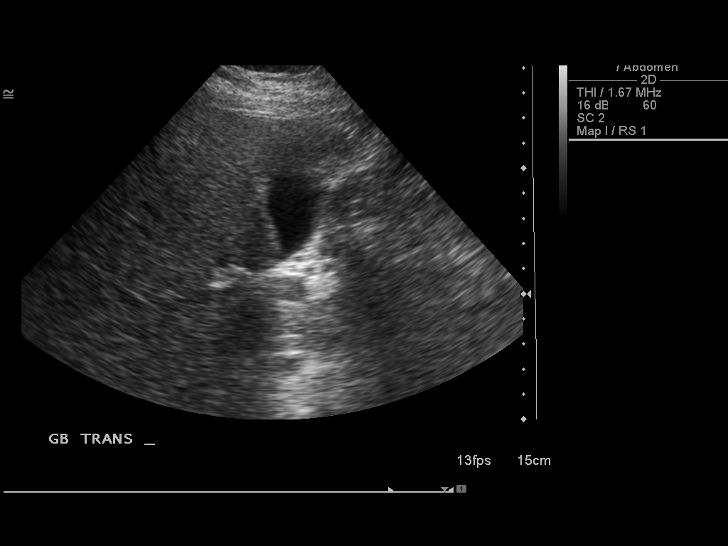
[im 54/92]
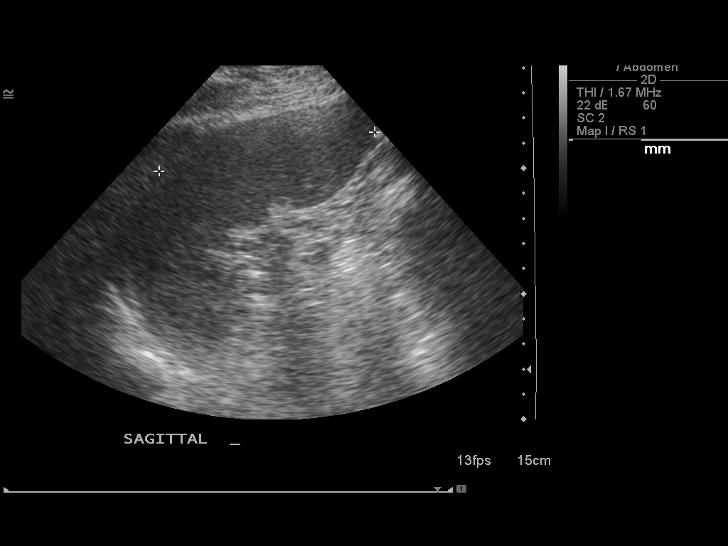
[im 61/92]
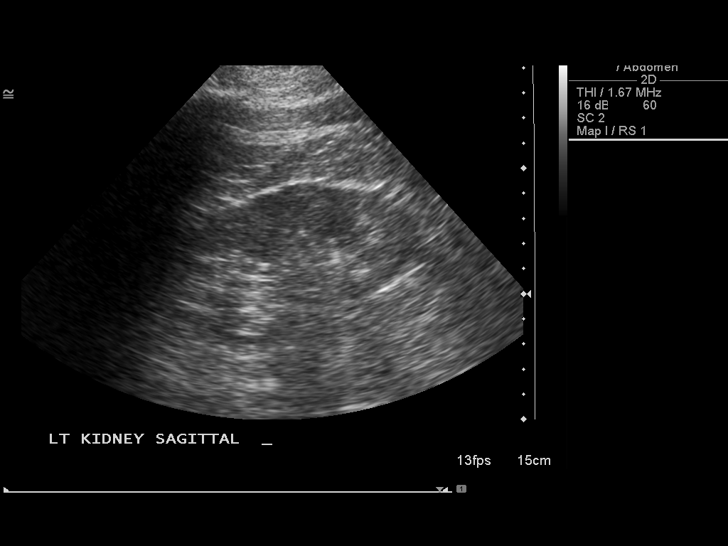
[im 69/92]
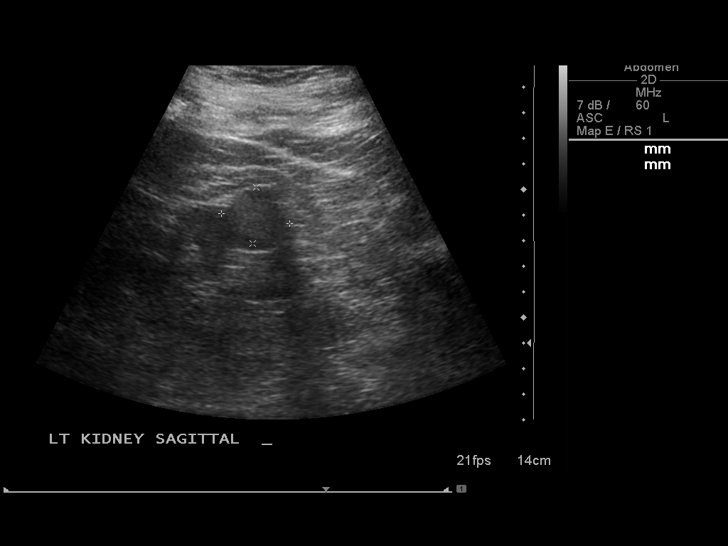
[im 76/92]
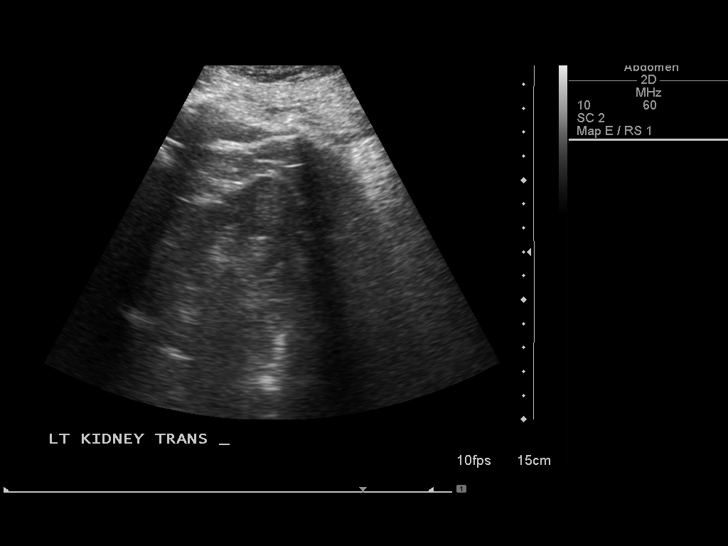
[im 84/92]
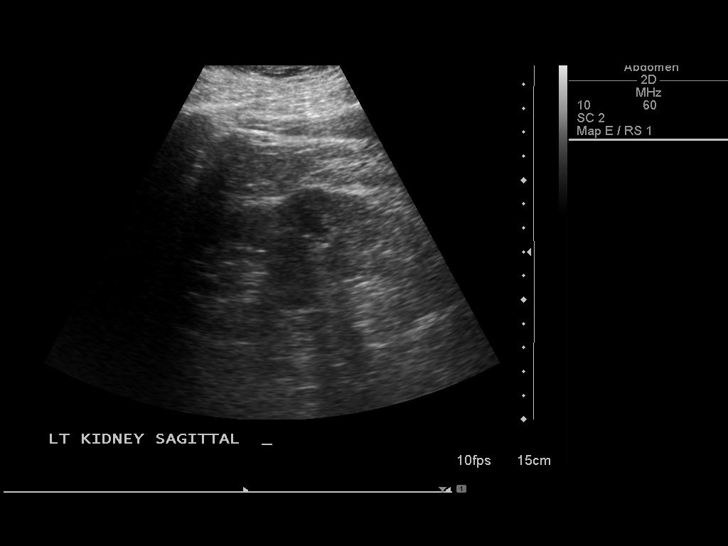
[im 92/92]
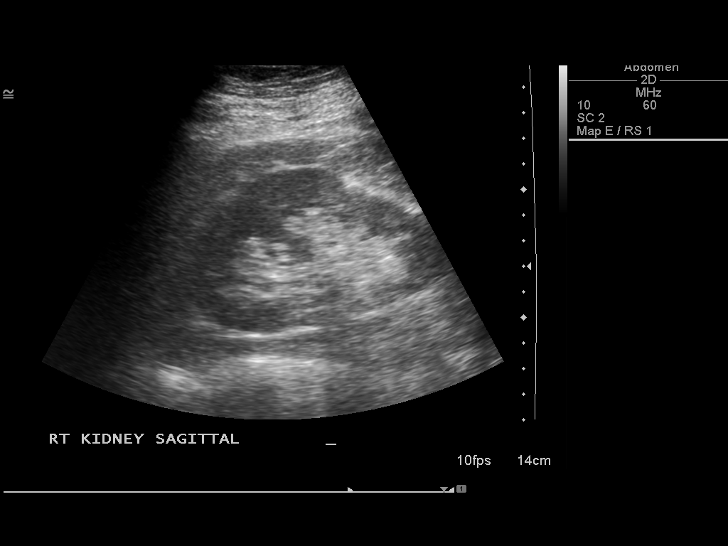

[13 of 25 positions shown; findings below may reference images not displayed]

FINDINGS: Gallbladder:  No shadowing gallstones or echogenic sludge.  No
gallbladder wall thickening or pericholecystic fluid.  Negative
sonographic Murphy's sign according to the ultrasound technologist.

Common bile duct:  Measures up to 4.7 mm in the porta hepatis.

Liver:  Liver appears diffusely enlarged measuring approximately 19
cm.  Normal echotexture without focal parenchymal abnormality.
Patent portal vein with hepatopetal flow.

IVC:  Patent throughout its visualized course in the abdomen.

Pancreas:  Although the pancreas is difficult to visualize in its
entirety, no focal pancreatic abnormality is identified.

Spleen:  Normal size and echotexture without focal parenchymal
abnormality.

Right Kidney:  No hydronephrosis. Mild diffusely thinned cortex.
Normal size and parenchymal echotexture without focal
abnormalities.

Left Kidney:  Poorly visualized. No definite hydronephrosis.  Mild
diffusely thinned cortex.  Generally normal parenchymal echo
texture, however, in the interpolar region there is a well-defined
2.7 x 2.2 x 2.5 cm hyperechoic lesion which is suspicious for a
solid small solid neoplasm.

Abdominal aorta:  Measures up to 2.4 cm in diameter proximally, and
tapers appropriately distally.  Extensive atherosclerotic plaque.
IMPRESSION: 1.  Suspicious-appearing 2.7 x 2.2 x 2.5 cm hyperechoic lesion in
the left kidney may represent a small solid neoplasm.  This could
be better characterized with contrast-enhanced MRI or CT scan if
clinically indicated.
2. Hepatomegaly.

## 2013-11-13 IMAGING — CT CT BIOPSY
4 of 7 series · 8 of 28 positions shown, 11 images · non-contrast
Comparison: none

Clinical Data/Indication: ANEMIA

CT-GUIDED BIOPSY BONE MARROW ASPIRATE AND CORE.
Sedation: Versed 2 mg, Fentanyl 200 mcg.
Total Moderate Sedation Time: 20 minutes.
Procedure: The procedure, risks, benefits, and alternatives were
explained to the patient. Questions regarding the procedure were
encouraged and answered. The patient understands and consents to
the procedure.
The back was prepped with betadine in a sterile fashion, and a
sterile drape was applied covering the operative field. A sterile
gown and sterile gloves were used for the procedure.
Under CT guidance, a 13 gauge needle was inserted into the left
iliac bone.  Aspirates were obtained.  A core was obtained.
Subsequently, an 11 gauge needle was inserted into the left iliac
bone and a second core was obtained. Final imaging was performed.
Patient tolerated the procedure well without complication.  Vital
sign monitoring by nursing staff during the procedure will continue
as patient is in the special procedures unit for post procedure
observation.

[Series 4: add scan 5.0 b70f · axial · 0.74mm/px · z∈[-352,-346]mm · 2 of 4 slices shown, 5 images (1 of 4)]
[im 2/4  soft-tissue]
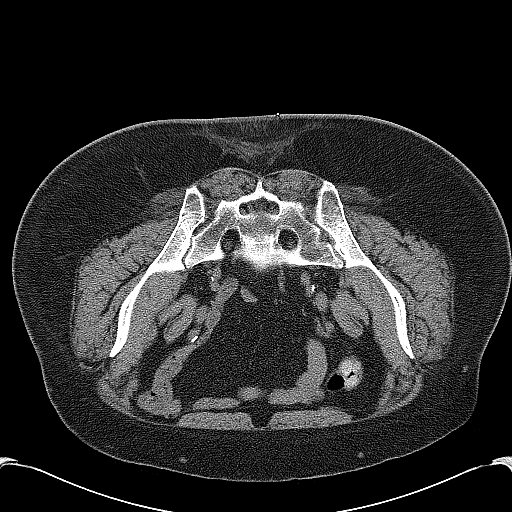
[im 2/4  lung]
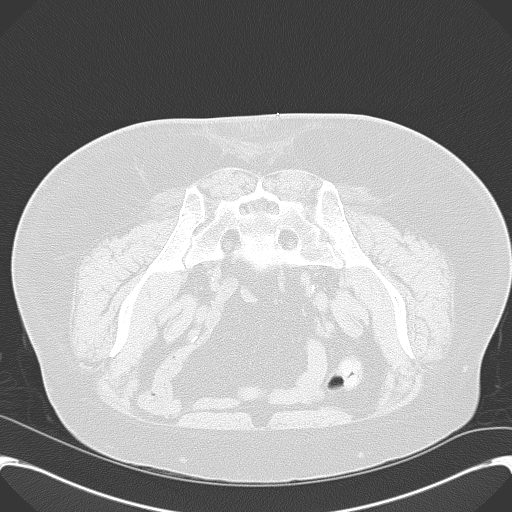
[im 2/4  bone]
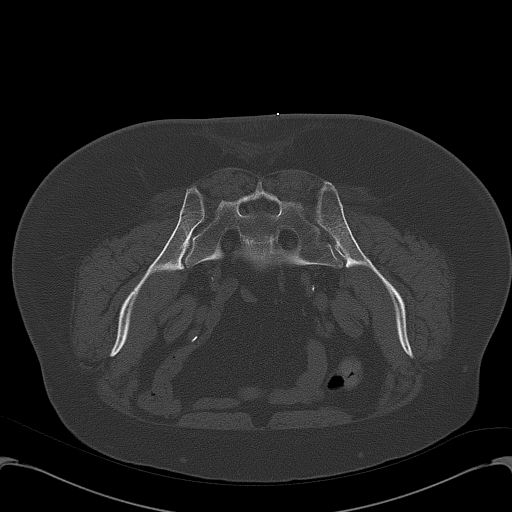
[im 3/4  soft-tissue]
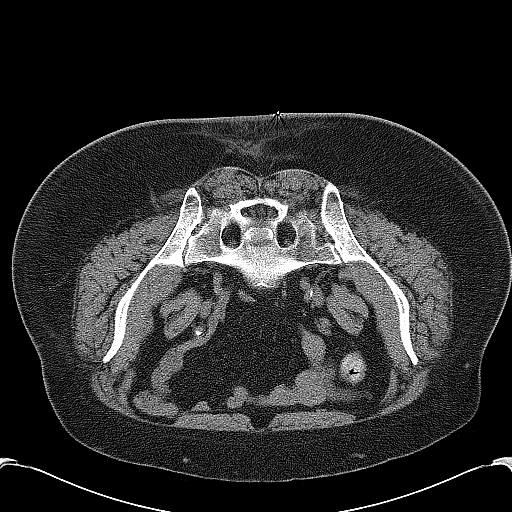
[im 3/4  lung]
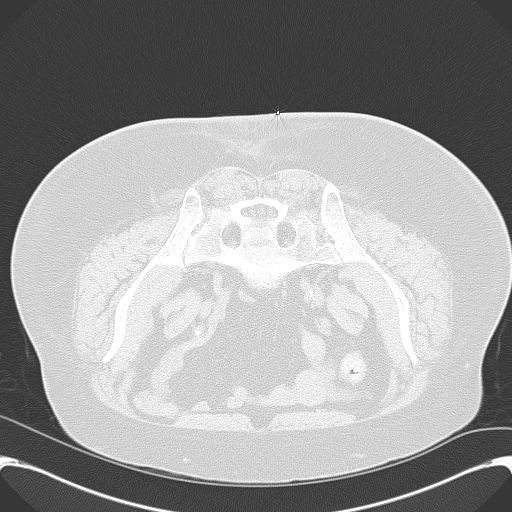

[Series 5: add scan 5.0 b70f · axial · 0.74mm/px · z∈[-352,-346]mm · 2 of 4 slices shown (2 of 4)]
[im 2/4  soft-tissue]
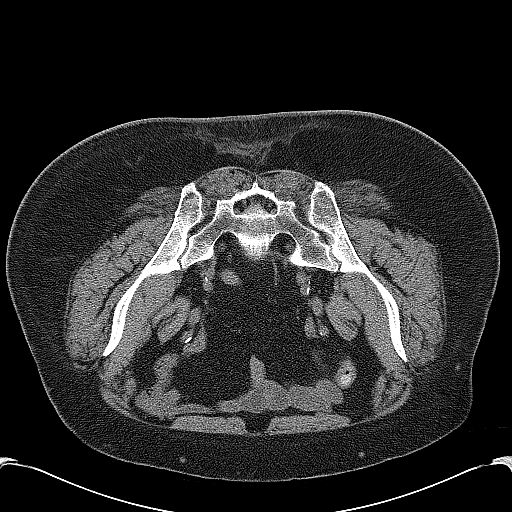
[im 3/4  soft-tissue]
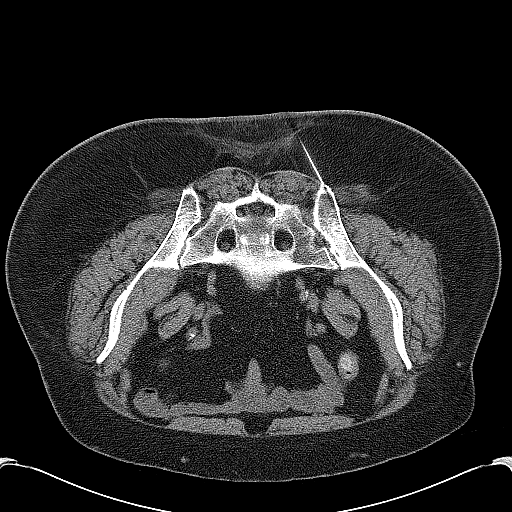

[Series 6: add scan 5.0 b70f · axial · 0.74mm/px · z∈[-352,-346]mm · 2 of 4 slices shown (3 of 4)]
[im 2/4  soft-tissue]
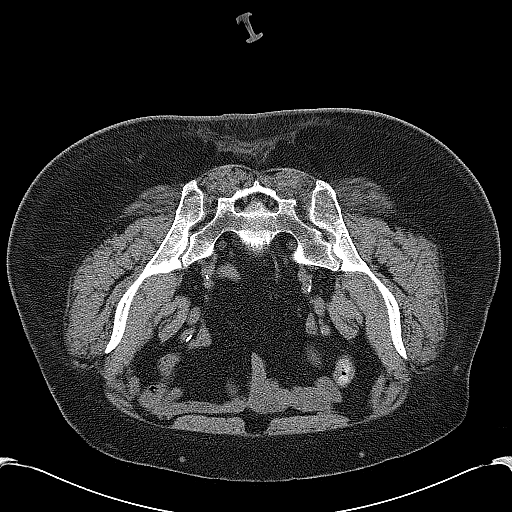
[im 3/4  soft-tissue]
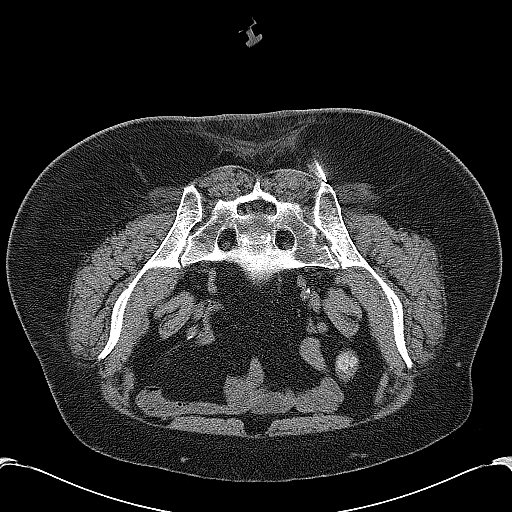

[Series 7: add scan 5.0 b70f · axial · 0.74mm/px · z∈[-352,-346]mm · 2 of 4 slices shown (4 of 4)]
[im 2/4  soft-tissue]
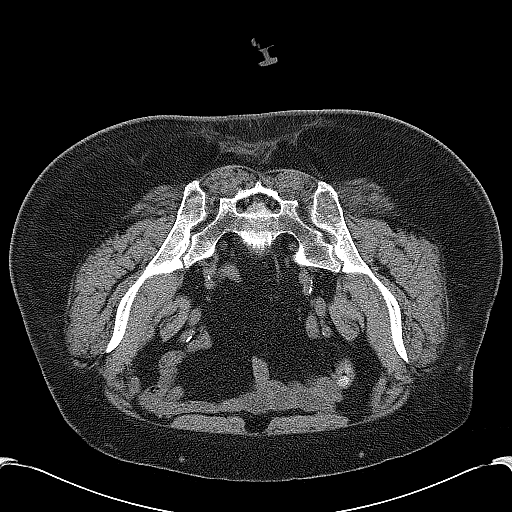
[im 3/4  soft-tissue]
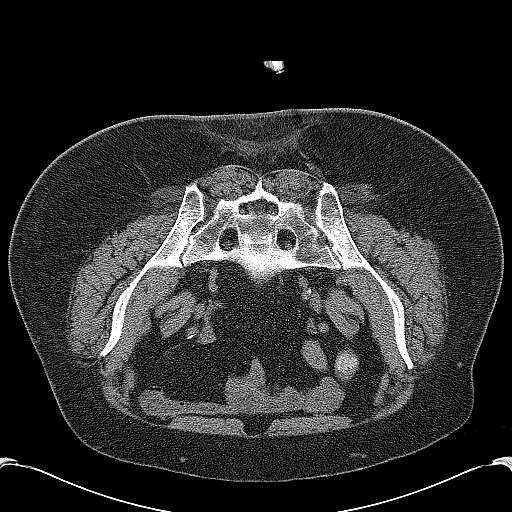

[8 of 28 positions shown; findings below may reference images not displayed]

FINDINGS: The images document guide needle placement within the
left iliac bone.  Post biopsy images demonstrate no hemorrhage.
IMPRESSION: Successful CT-guided bone marrow aspirate and core.

## 2013-11-14 ENCOUNTER — Other Ambulatory Visit: Payer: Self-pay | Admitting: Internal Medicine

## 2013-11-14 DIAGNOSIS — J439 Emphysema, unspecified: Secondary | ICD-10-CM

## 2013-11-14 DIAGNOSIS — E785 Hyperlipidemia, unspecified: Secondary | ICD-10-CM

## 2013-11-14 DIAGNOSIS — E1140 Type 2 diabetes mellitus with diabetic neuropathy, unspecified: Secondary | ICD-10-CM

## 2013-11-14 DIAGNOSIS — I5032 Chronic diastolic (congestive) heart failure: Secondary | ICD-10-CM

## 2013-11-14 NOTE — Progress Notes (Signed)
[name] is on anticoagulation for atrial fibrillation.  INR 2.0.  I have reviewed Dr. Gladstone Pih note.

## 2013-11-15 ENCOUNTER — Ambulatory Visit: Payer: Medicare Other | Admitting: Physical Therapy

## 2013-11-21 ENCOUNTER — Ambulatory Visit (INDEPENDENT_AMBULATORY_CARE_PROVIDER_SITE_OTHER): Payer: Medicare Other | Admitting: Pharmacist

## 2013-11-21 DIAGNOSIS — Z7901 Long term (current) use of anticoagulants: Secondary | ICD-10-CM

## 2013-11-21 DIAGNOSIS — I480 Paroxysmal atrial fibrillation: Secondary | ICD-10-CM

## 2013-11-21 LAB — POCT INR: INR: 2.4

## 2013-11-21 NOTE — Progress Notes (Signed)
Anti-Coagulation Progress Note  [name] is a 63 y.o. female who reports to the clinic for monitoring of anticoagulation treatment.    RECENT RESULTS: Recent results are below, the most recent result is correlated with a dose of 17.5 mg. per week: Lab Results  Component Value Date   INR 2.4 11/21/2013   INR 2.00 10/24/2013   INR 2.90 10/03/2013    Weekly dose was unchanged   ANTI-COAG DOSE: INR as of 11/21/2013 and Previous Dosing Information   INR Dt INR Goal [employer] Sun Mon Tue Wed Thu Fri Sat   11/21/2013 2.4 2.0-3.0 17.5 mg 2.5 mg 2.5 mg 2.5 mg 2.5 mg 2.5 mg 2.5 mg 2.5 mg    Anticoagulation Dose Instructions as of 11/21/2013     Total Sun Mon Tue Wed Thu Fri Sat   New Dose 17.5 mg 2.5 mg 2.5 mg 2.5 mg 2.5 mg 2.5 mg 2.5 mg 2.5 mg     (5 mg x 0.5)  (5 mg x 0.5)  (5 mg x 0.5)  (5 mg x 0.5)  (5 mg x 0.5)  (5 mg x 0.5)  (5 mg x 0.5)                           ANTICOAG SUMMARY: Anticoagulation Episode Summary   Current INR goal 2.0-3.0  Next INR check 12/19/2013  INR from last check 2.4 (11/21/2013)  Weekly max dose   Target end date   [facility]  Preferred lab   Send INR reminders to [facility] [place]   Indications  Paroxysmal atrial fibrillation [I48.0] Long term (current) use of anticoagulants [Z79.01]        Comments       Anticoagulation Care Providers   Provider Role Specialty Phone number   Lelon Perla, MD  Cardiology [phone]     PATIENT INSTRUCTIONS: Patient Instructions  Patient instructed to take medications as defined in the Anti-coagulation Track section of this encounter.  Patient instructed to take today's dose.  Patient verbalized understanding of these instructions.     FOLLOW-UP 12/19/13  Flossie Dibble, PharmD BCPS, BCACP

## 2013-11-21 NOTE — Patient Instructions (Signed)
Patient instructed to take medications as defined in the Anti-coagulation Track section of this encounter.  Patient instructed to take today's dose.  Patient verbalized understanding of these instructions.

## 2013-11-24 NOTE — Progress Notes (Signed)
[name] is on coumadin for Afib.  Her INR is at goal.  I have reviewed Dr. Julianne Rice note.

## 2013-12-01 ENCOUNTER — Encounter: Payer: Self-pay | Admitting: Internal Medicine

## 2013-12-01 ENCOUNTER — Ambulatory Visit (INDEPENDENT_AMBULATORY_CARE_PROVIDER_SITE_OTHER): Payer: Medicare Other | Admitting: Internal Medicine

## 2013-12-01 VITALS — BP 134/64 | HR 97 | Temp 98.0°F | Wt 196.1 lb

## 2013-12-01 DIAGNOSIS — E878 Other disorders of electrolyte and fluid balance, not elsewhere classified: Secondary | ICD-10-CM

## 2013-12-01 DIAGNOSIS — E785 Hyperlipidemia, unspecified: Secondary | ICD-10-CM

## 2013-12-01 DIAGNOSIS — B373 Candidiasis of vulva and vagina: Secondary | ICD-10-CM

## 2013-12-01 DIAGNOSIS — G8929 Other chronic pain: Secondary | ICD-10-CM

## 2013-12-01 DIAGNOSIS — M940 Chondrocostal junction syndrome [Tietze]: Secondary | ICD-10-CM | POA: Insufficient documentation

## 2013-12-01 DIAGNOSIS — Z794 Long term (current) use of insulin: Secondary | ICD-10-CM

## 2013-12-01 DIAGNOSIS — F329 Major depressive disorder, single episode, unspecified: Secondary | ICD-10-CM

## 2013-12-01 DIAGNOSIS — G4733 Obstructive sleep apnea (adult) (pediatric): Secondary | ICD-10-CM

## 2013-12-01 DIAGNOSIS — M545 Low back pain, unspecified: Secondary | ICD-10-CM

## 2013-12-01 DIAGNOSIS — E1140 Type 2 diabetes mellitus with diabetic neuropathy, unspecified: Secondary | ICD-10-CM

## 2013-12-01 DIAGNOSIS — Z720 Tobacco use: Secondary | ICD-10-CM

## 2013-12-01 DIAGNOSIS — F339 Major depressive disorder, recurrent, unspecified: Secondary | ICD-10-CM

## 2013-12-01 DIAGNOSIS — Z0000 Encounter for general adult medical examination without abnormal findings: Secondary | ICD-10-CM

## 2013-12-01 DIAGNOSIS — M810 Age-related osteoporosis without current pathological fracture: Secondary | ICD-10-CM

## 2013-12-01 DIAGNOSIS — B3731 Acute candidiasis of vulva and vagina: Secondary | ICD-10-CM

## 2013-12-01 DIAGNOSIS — E1165 Type 2 diabetes mellitus with hyperglycemia: Secondary | ICD-10-CM

## 2013-12-01 LAB — POCT GLYCOSYLATED HEMOGLOBIN (HGB A1C): Hemoglobin A1C: 8.5

## 2013-12-01 LAB — GLUCOSE, CAPILLARY: Glucose-Capillary: 236 mg/dL — ABNORMAL HIGH (ref 70–99)

## 2013-12-01 MED ORDER — INSULIN DETEMIR 100 UNIT/ML FLEXPEN: 42.0000 [IU] | PEN_INJECTOR | Freq: Two times a day (BID) | SUBCUTANEOUS | Status: DC

## 2013-12-01 MED ORDER — PAROXETINE HCL 40 MG PO TABS: 40.0000 mg | ORAL_TABLET | ORAL | Status: DC

## 2013-12-01 NOTE — Assessment & Plan Note (Signed)
She states that her pain is slightly improved recently. She attributes this to [employer] taking the aspartane out of its products. I am not sure that is the reason for the slight improvement in her chronic pain but I am encouraged that she is feeling better. Nonetheless we will continue the [place] on an as-needed basis.

## 2013-12-01 NOTE — Assessment & Plan Note (Signed)
Her 10 year cardiovascular risk is greater than 20%. She is therefore maintained on a high intensity statin. We are continuing the rosuvastatin at 20 mg by mouth daily as per the guidelines.

## 2013-12-01 NOTE — Assessment & Plan Note (Signed)
She is up-to-date on her preventative health care.

## 2013-12-01 NOTE — Assessment & Plan Note (Signed)
She notes an occasional sternal chest pain associated with the end of the deep breath. Examination revealed tenderness along the right costochondral junction. This reproduced the pain she was experiencing. I suspect the cause of her pain is costochondritis. Palpation along her median sternotomy scar did not reveal a step-off to suggest malalignment after her median sternotomy. She was advised to continue to take the as needed Advil for this pain. We will reassess this issue at the followup visit.

## 2013-12-01 NOTE — Assessment & Plan Note (Signed)
She continues to have occasional bouts of vulvovaginal candidiasis. I suspect this is related to the suboptimal control of her diabetes. We therefore made some adjustments in her diabetic regimen. She also has available a single dose of Diflucan should she need it.

## 2013-12-01 NOTE — Assessment & Plan Note (Signed)
Her hemoglobin A1c today was 8.5 which is up from 7.6. This occurred after we changed the meal coverage from 3 times a day to once a day because of the pain associated with the new insulin (Humalog) her insurance company required a change to. As her target is a hemoglobin A1c less than 8 we will increase the Levemir from 40 units twice daily to 42 units twice daily and reassess her diabetic control with a repeat hemoglobin A1c at her return appointment on December 4. She is up-to-date on her diabetic health maintenance as we also performed a diabetic foot exam today.

## 2013-12-01 NOTE — Progress Notes (Signed)
   Subjective:    Patient ID: [name], female    DOB: November 03, 1950, 63 y.o.   MRN: [id number]  HPI  Please see the A&P for the status of the pt's chronic medical problems.  Review of Systems  Constitutional: Negative for activity change and appetite change.  Respiratory: Positive for shortness of breath.   Cardiovascular: Positive for chest pain. Negative for palpitations and leg swelling.  Gastrointestinal: Positive for nausea and abdominal pain. Negative for vomiting.  Musculoskeletal: Positive for arthralgias and back pain. Negative for joint swelling and myalgias.  Skin: Negative for color change, rash and wound.  Neurological: Positive for headaches. Negative for syncope and light-headedness.  Psychiatric/Behavioral: Positive for dysphoric mood. The patient is nervous/anxious.       Objective:   Physical Exam  Nursing note and vitals reviewed. Constitutional: She is oriented to person, place, and time. She appears well-developed and well-nourished. No distress.  HENT:  Head: Normocephalic and atraumatic.  Eyes: Conjunctivae are normal. Right eye exhibits no discharge. Left eye exhibits no discharge. No scleral icterus.  Cardiovascular: Normal rate, regular rhythm and normal heart sounds.  Exam reveals no gallop and no friction rub.   No murmur heard. Pulmonary/Chest: Effort normal and breath sounds normal. No respiratory distress. She has no wheezes. She has no rales. She exhibits tenderness.  Abdominal: Soft. Bowel sounds are normal. She exhibits no distension. There is no tenderness. There is no rebound and no guarding.  Musculoskeletal: Normal range of motion. She exhibits no edema and no tenderness.  Neurological: She is alert and oriented to person, place, and time. She exhibits normal muscle tone.  Skin: Skin is warm and dry. No rash noted. She is not diaphoretic. No erythema.  Psychiatric: She has a normal mood and affect. Her behavior is normal. Judgment and  thought content normal.      Assessment & Plan:   Please see problem oriented charting.

## 2013-12-01 NOTE — Assessment & Plan Note (Signed)
Despite increasing her sertraline to 200 mg daily at the last visit, her depression symptoms remain. She is not interested in switching to Prozac for personal reasons but was willing to change to Paxil to see if she gets better results with that medication. The sertraline was therefore discontinued and Paxil was started at 40 mg by mouth daily. We will reassess her depressive symptoms at the followup visit.

## 2013-12-01 NOTE — Assessment & Plan Note (Signed)
She has been compliant with her calcium therapy but was not interested in taking the alendronate after having a bad experience with the Boniva (diarrhea). Her last DEXA scan was 2 years ago which showed osteoporosis. We discussed the issue and she states she would try the alendronate if the DEXA scan showed progression of her osteoporosis. We therefore put in a request for a DEXA scan to reassess if her osteoporosis has progressed off of the bisphosphonate therapy.

## 2013-12-01 NOTE — Patient Instructions (Signed)
It was great to see you as always.  I am so proud of you quitting smoking.  1) Stop the zoloft and start paxil 40 mg daily for your depression.  We will see if this works better.  2) Increase levimir to 42 units twice daily.  3) Keep taking all of the other medications just as you are.  4) I will order a DEXA scan to make sure your osteoporosis is not worsening.  I will see you back in December as scheduled, sooner if necessary.

## 2013-12-01 NOTE — Assessment & Plan Note (Signed)
By far the most important development in the interim has been her ability to quit smoking. She stopped on September 21 and has been tobacco free for one month. She used the nicotine patch for 2 weeks but has not required it since. Despite stopping the nicotine replacement therapy she has not had any signs or symptoms of tobacco withdrawal. She was praised for her smoking cessation and reminded that it is the most important intervention she could have done for her health. We will continue to encourage smoking cessation and monitor that she does not fall back into tobacco use at her followup visits.

## 2013-12-01 NOTE — Assessment & Plan Note (Signed)
She states she's been compliant with her nocturnal CPAP. She does admit to some morning headaches. As she has chronic daily headaches it is unclear if this is related to untreated or suboptimally treated obstructive sleep apnea or some other condition. She does not complain of excessive daytime somnolence. We will reassess for worsening daytime somnolence or other symptoms suggestive of inadequately treated obstructive sleep apnea at her followup visits.

## 2013-12-13 ENCOUNTER — Other Ambulatory Visit: Payer: Self-pay | Admitting: Internal Medicine

## 2013-12-16 ENCOUNTER — Encounter: Payer: Self-pay | Admitting: *Deleted

## 2013-12-16 ENCOUNTER — Telehealth: Payer: Self-pay | Admitting: Internal Medicine

## 2013-12-16 NOTE — Telephone Encounter (Signed)
Reason for call:   I received a call from [name] at 7:00  PM indicating that her blood sugars have been quite high, as high as 300.   Pertinent Data:   Patient is not using Levemir 42 units twice daily, as well as Humalog 18 + correction factor (3-12 units SS based on sugars).  This AM @ 7:00 AM, CBG was read at 314, she took 30 units humalog.   At 11:00 AM, CBG was 305, she took 30 units humalog.  At 6:00 PM, CBG was 317. Had not eaten yet.   The patient denies increased urine output but does admit to being thirsty. She denies any vision changes, abdominal pain, nausea, vomiting.    Assessment / Plan / Recommendations:   Advised patient to avoid high carbohydrate foods and explained what these are in detail. Advised her to continue w/ her insulin dosages as they are at this time and continue to check her CBG's regularly.   Will send a message to [facility] clinic to set up an appointment for Monday or Tuesday.   As always, pt is advised that if symptoms worsen or new symptoms arise, they should go to an urgent care facility or to to ER for further evaluation.   Corky Sox, MD   12/16/2013, 6:57 PM

## 2013-12-19 ENCOUNTER — Ambulatory Visit (HOSPITAL_COMMUNITY)
Admission: RE | Admit: 2013-12-19 | Discharge: 2013-12-19 | Disposition: A | Discharge status: Home / Self Care | Payer: Medicare Other | Source: Ambulatory Visit | Attending: Internal Medicine | Admitting: Internal Medicine

## 2013-12-19 ENCOUNTER — Ambulatory Visit (INDEPENDENT_AMBULATORY_CARE_PROVIDER_SITE_OTHER): Payer: Medicare Other | Admitting: Internal Medicine

## 2013-12-19 ENCOUNTER — Encounter: Payer: Self-pay | Admitting: Internal Medicine

## 2013-12-19 ENCOUNTER — Other Ambulatory Visit: Payer: Self-pay | Admitting: *Deleted

## 2013-12-19 ENCOUNTER — Telehealth: Payer: Self-pay | Admitting: Dietician

## 2013-12-19 ENCOUNTER — Ambulatory Visit (INDEPENDENT_AMBULATORY_CARE_PROVIDER_SITE_OTHER): Payer: Medicare Other | Admitting: Pharmacist

## 2013-12-19 VITALS — BP 144/54 | HR 79 | Temp 98.4°F | Wt 198.3 lb

## 2013-12-19 DIAGNOSIS — Z7901 Long term (current) use of anticoagulants: Secondary | ICD-10-CM

## 2013-12-19 DIAGNOSIS — Z951 Presence of aortocoronary bypass graft: Secondary | ICD-10-CM | POA: Diagnosis not present

## 2013-12-19 DIAGNOSIS — I480 Paroxysmal atrial fibrillation: Secondary | ICD-10-CM

## 2013-12-19 DIAGNOSIS — J029 Acute pharyngitis, unspecified: Secondary | ICD-10-CM

## 2013-12-19 DIAGNOSIS — Z720 Tobacco use: Secondary | ICD-10-CM

## 2013-12-19 DIAGNOSIS — G8929 Other chronic pain: Secondary | ICD-10-CM

## 2013-12-19 DIAGNOSIS — R079 Chest pain, unspecified: Secondary | ICD-10-CM | POA: Diagnosis not present

## 2013-12-19 DIAGNOSIS — E1140 Type 2 diabetes mellitus with diabetic neuropathy, unspecified: Secondary | ICD-10-CM

## 2013-12-19 DIAGNOSIS — M545 Low back pain, unspecified: Secondary | ICD-10-CM

## 2013-12-19 DIAGNOSIS — R51 Headache: Secondary | ICD-10-CM

## 2013-12-19 DIAGNOSIS — B373 Candidiasis of vulva and vagina: Secondary | ICD-10-CM

## 2013-12-19 DIAGNOSIS — K219 Gastro-esophageal reflux disease without esophagitis: Secondary | ICD-10-CM

## 2013-12-19 DIAGNOSIS — B3731 Acute candidiasis of vulva and vagina: Secondary | ICD-10-CM

## 2013-12-19 DIAGNOSIS — R519 Headache, unspecified: Secondary | ICD-10-CM

## 2013-12-19 LAB — GLUCOSE, CAPILLARY: Glucose-Capillary: 172 mg/dL — ABNORMAL HIGH (ref 70–99)

## 2013-12-19 LAB — RAPID STREP SCREEN (MED CTR MEBANE ONLY): Streptococcus, Group A Screen (Direct): NEGATIVE

## 2013-12-19 LAB — POCT INR: INR: 2.5

## 2013-12-19 MED ORDER — KETOROLAC TROMETHAMINE 30 MG/ML IJ SOLN
30.0000 mg | Freq: Once | INTRAMUSCULAR | Status: AC
  Administered 2013-12-19: 30 mg via INTRAMUSCULAR

## 2013-12-19 NOTE — Telephone Encounter (Signed)
Talked with pt - offered appt today 2:45PM Dr Aundra Dubin. Problems with transportation - may have to resch.

## 2013-12-19 NOTE — Patient Instructions (Signed)
Patient instructed to take medications as defined in the Anti-coagulation Track section of this encounter.  Patient instructed to take today's dose.  Patient verbalized understanding of these instructions.

## 2013-12-19 NOTE — Telephone Encounter (Signed)
[name] is calling to follow up to see if she has appointment today as per her conversation with dr. Ronnald Ramp on Friday night about her blood sugars. She also says she needs to speak with our triage nurse. message given to triage nurse immediately.

## 2013-12-19 NOTE — Progress Notes (Signed)
Anti-Coagulation Progress Note  MAECI KALBFLEISCH is a 62 y.o. female who is currently on an anti-coagulation regimen.    RECENT RESULTS: Recent results are below, the most recent result is correlated with a dose of 17.5 mg. per week: Lab Results  Component Value Date   INR 2.50 12/19/2013   INR 2.4 11/21/2013   INR 2.00 10/24/2013    ANTI-COAG DOSE: Anticoagulation Dose Instructions as of 12/19/2013      [name] Tue Wed Thu Fri Sat   New Dose 2.5 mg 2.5 mg 2.5 mg 2.5 mg 2.5 mg 2.5 mg 2.5 mg       ANTICOAG SUMMARY: Anticoagulation Episode Summary    Current INR goal 2.0-3.0  Next INR check 01/23/2014  INR from last check 2.50 (12/19/2013)  Weekly max dose   Target end date   INR check location [facility]  Preferred lab   Send INR reminders to [place]   Indications  Paroxysmal atrial fibrillation [I48.0] Long term (current) use of anticoagulants [Z79.01]        Comments       Anticoagulation Care Providers    Provider Role Specialty Phone number   Lelon Perla, MD  Cardiology [phone]      ANTICOAG TODAY: Anticoagulation Summary as of 12/19/2013    INR goal 2.0-3.0  Selected INR 2.50 (12/19/2013)  Next INR check 01/23/2014  Target end date    Indications  Paroxysmal atrial fibrillation [I48.0] Long term (current) use of anticoagulants [Z79.01]      Anticoagulation Episode Summary    INR check location [facility]   Preferred lab    Send INR reminders to [place]   Comments     Anticoagulation Care Providers    Provider Role Specialty Phone number   Lelon Perla, MD  Cardiology [phone]      PATIENT INSTRUCTIONS: Patient Instructions  Patient instructed to take medications as defined in the Anti-coagulation Track section of this encounter.  Patient instructed to take today's dose.  Patient verbalized understanding of these instructions.       FOLLOW-UP Return in 5 weeks (on  01/23/2014) for Follow up at 1015h.  Jorene Guest, III Pharm.D., CACP

## 2013-12-20 ENCOUNTER — Encounter: Payer: Self-pay | Admitting: Internal Medicine

## 2013-12-20 MED ORDER — HYDROCODONE-ACETAMINOPHEN 5-325 MG PO TABS: 1.0000 | ORAL_TABLET | Freq: Four times a day (QID) | ORAL | Status: DC | PRN

## 2013-12-20 MED ORDER — FLUCONAZOLE 150 MG PO TABS: 150.0000 mg | ORAL_TABLET | Freq: Once | ORAL | Status: DC

## 2013-12-20 NOTE — Progress Notes (Signed)
Subjective:    Patient ID: [name], female    DOB: 12-Sep-1950, 62 y.o.   MRN: [id number]  HPI Comments: 63 y.o with chronic medical conditions   Appt scheduled for  1. Hyperglycemia though pt c/o multiple somatic complaints as well 2. Hyperglycemia noted x 2 weeks.  cbg 172 today.  Recently pt taking Levemir 42 units bid, Lispro 18 units in am plus SSI (will take between 3-9 units additionally or up to 30 units short acting insulin).  She is checking her cbgs 3-4 x per day and denies any values <70.  Meter readings range in 180s-380s and am readings are elevated though taking medication.  She is also taking SSI. She reports compliance, no diet changes.   3. She c/o sore throat x 2 days, denies cough, fever or chills though having sweats and feeling clammy 4. She c/o nausea (taking phenerghan), vomiting and uncontrolled heartburn.  Her insurance will only cover Prilosec for 1 x per day though pt is having to take medication bid due to heartburn not being controlled. Disc'ed with her about following up with GI MD [name] in the future but she does not want to go at this time due to finances.  Also disc'ed changing PPI but pt declines at this time.   5. She c/o rash/irritation in vaginal rectal area which is not as bad as it has been in the past but irritated and she thinks it is yeast related. Area itches and feels blistered and raw and she uses wipes to the area.  She has been using nystatin cream the area and may have a diflucan pill left over from previous time to take.   6. H/o chronic h/a-c/o h/a today 9/10 in [place] region but not the worst h/a of her life. She is bothered by light.  She states she took a Norco/Vicodin pill this am but did not have a h/a so is unsure if it helped.     ROS per above   SH: recently quit smoking      Review of Systems  Constitutional: Negative for fever and chills.  Respiratory: Positive for chest tightness. Negative for cough and shortness of  breath.   Cardiovascular: Negative for palpitations.       On exam c/o mid chest pain sharp with inspiration  Genitourinary: Negative for vaginal discharge.       Objective:   Physical Exam  Constitutional: She is oriented to person, place, and time. Vital signs are normal. She appears well-developed and well-nourished. She is cooperative. No distress.  Very talkative   HENT:  Head: Normocephalic and atraumatic.  Mouth/Throat: Oropharyngeal exudate present.  Min exudate in tonsillar area R>L, throat seems dry  Eyes: Conjunctivae are normal. Right eye exhibits no discharge. Left eye exhibits no discharge. No scleral icterus.  Cardiovascular: Normal rate, regular rhythm, S1 normal, S2 normal and normal heart sounds.   No murmur heard.   Pulmonary/Chest: Effort normal and breath sounds normal. No respiratory distress. She has no wheezes.  Has BL O2 via nasal cannula on   Abdominal: Soft. Bowel sounds are normal. There is no tenderness.  Genitourinary:     Neurological: She is alert and oriented to person, place, and time. Gait normal.  In wheelchair though can walk short distances   Skin: Skin is warm and dry. Rash noted. She is not diaphoretic.  See GU  Psychiatric: She has a normal mood and affect. Her speech is normal and behavior is normal.  Judgment and thought content normal. Cognition and memory are normal.  Nursing note and vitals reviewed.         Assessment & Plan:  F/u in 2 weeks as needed hyperglycemia

## 2013-12-20 NOTE — Assessment & Plan Note (Signed)
Lab Results  Component Value Date   HGBA1C 8.5 12/01/2013   HGBA1C 7.6 08/05/2013   HGBA1C 7.9 04/29/2013     Assessment: Diabetes control: fair control Progress toward A1C goal:  deteriorated (hyperglycemia recently per meter readings ) Comments: hyperglycemia recently   Plan: Medications:  continue current medications (increase Levemir to 42 bid to 45 units bid), continue Lispro 18 units in am plus revised SSI; pt met with Barry Brunner today  Amiri Kobrin's New Correction Scale as of 12-19-13  Check blood sugar before meals. If it is:  less than 150 add no extra Humalog  151-200 add 5 units of Humalog to your meal dose  201-250 add 10 units of Humalog to your meal dose  251-300 add 15 units of Humalog to your meal dose  301-350 add 20 units of Humalog to your meal dose  351-400 add 25 units of Humalog to your meal dose  401-450 add 30 units of Humalog to your meal dose and call office. If office is closed, call the after-hours resident for instructions at [phone]    Home glucose monitoring: Frequency: 4 times a day Timing: before meals Instruction/counseling given: no instruction/counseling  Educational resources provided: yes Self management tools provided: copy of home glucose meter download Other plans: f/u in 2 weeks prn

## 2013-12-20 NOTE — Patient Instructions (Signed)
General Instructions: Follow up in 2 weeks if needed for blood sugar or any other complaints Take Levemir 45 units 2 x per day along with change in sliding scale disc'ed with Barry Brunner today You may need to see your gastroenterologist again ([name]) for uncontrolled heartburn Try Diflucan 150 mg x 1 for the rash in your private area and continue Nystatin  Make sure you are using your inhalers and taking Lasix.   Try warm salt water gargles for your throat    Please bring your medicines with you each time you come to clinic.  Medicines may include prescription medications, over-the-counter medications, herbal remedies, eye drops, vitamins, or other pills.   Progress Toward Treatment Goals:  Treatment Goal 12/19/2013  Hemoglobin A1C deteriorated  Blood pressure -  Stop smoking stopped smoking  Prevent falls -    Self Care Goals & Plans:  Self Care Goal 12/19/2013  Manage my medications take my medicines as prescribed; bring my medications to every visit; refill my medications on time; follow the sick day instructions if I am sick  Monitor my health keep track of my blood glucose; bring my glucose meter and log to each visit; keep track of my blood pressure; bring my blood pressure log to each visit; keep track of my weight; check my feet daily  Eat healthy foods drink diet soda or water instead of juice or soda; eat more vegetables; eat foods that are low in salt; eat baked foods instead of fried foods; eat fruit for snacks and desserts; eat smaller portions  Be physically active find an activity I enjoy  Stop smoking -  Prevent falls -  Meeting treatment goals maintain the current self-care plan    Home Blood Glucose Monitoring 12/19/2013  Check my blood sugar 4 times a day  When to check my blood sugar before meals     Care Management & Community Referrals:  Referral 12/19/2013  Referrals made for care management support none needed  Referrals made to community resources  none         Treatment Goals:  Goals (1 Years of Data) as of 12/19/13          12/19/13 12/01/13 12/01/13 10/07/13 09/14/13     Blood Pressure   . Blood Pressure < 140/90  144/54 134/64 132/94 135/65 137/45     Lifestyle   . Quit smoking / using tobacco   Yes  No      Result Component   . HEMOGLOBIN A1C < 7.0   8.5      . LDL CALC < 100           Weight   . Weight < 175 lb (79.379 kg)  198 lb 4.8 oz (89.948 kg) 196 lb 1.6 oz (88.95 kg)  195 lb 12.8 oz (88.814 kg)       Progress Toward Treatment Goals:  Treatment Goal 12/19/2013  Hemoglobin A1C deteriorated  Blood pressure -  Stop smoking stopped smoking  Prevent falls -    Self Care Goals & Plans:  Self Care Goal 12/19/2013  Manage my medications take my medicines as prescribed; bring my medications to every visit; refill my medications on time; follow the sick day instructions if I am sick  Monitor my health keep track of my blood glucose; bring my glucose meter and log to each visit; keep track of my blood pressure; bring my blood pressure log to each visit; keep track of my weight; check my feet daily  Eat healthy foods drink diet soda or water instead of juice or soda; eat more vegetables; eat foods that are low in salt; eat baked foods instead of fried foods; eat fruit for snacks and desserts; eat smaller portions  Be physically active find an activity I enjoy  Stop smoking -  Prevent falls -  Meeting treatment goals maintain the current self-care plan    Home Blood Glucose Monitoring 12/19/2013  Check my blood sugar 4 times a day  When to check my blood sugar before meals     Care Management & Community Referrals:  Referral 12/19/2013  Referrals made for care management support none needed  Referrals made to community resources none    Hyperglycemia Hyperglycemia occurs when the glucose (sugar) in your blood is too high. Hyperglycemia can happen for many reasons, but it most often happens to people who do not  know they have diabetes or are not managing their diabetes properly.  CAUSES  Whether you have diabetes or not, there are other causes of hyperglycemia. Hyperglycemia can occur when you have diabetes, but it can also occur in other situations that you might not be as aware of, such as: Diabetes  If you have diabetes and are having problems controlling your blood glucose, hyperglycemia could occur because of some of the following reasons:  Not following your meal plan.  Not taking your diabetes medications or not taking it properly.  Exercising less or doing less activity than you normally do.  Being sick. Pre-diabetes  This cannot be ignored. Before people develop Type 2 diabetes, they almost always have "pre-diabetes." This is when your blood glucose levels are higher than normal, but not yet high enough to be diagnosed as diabetes. Research has shown that some long-term damage to the body, especially the heart and circulatory system, may already be occurring during pre-diabetes. If you take action to manage your blood glucose when you have pre-diabetes, you may delay or prevent Type 2 diabetes from developing. Stress  If you have diabetes, you may be "diet" controlled or on oral medications or insulin to control your diabetes. However, you may find that your blood glucose is higher than usual in the hospital whether you have diabetes or not. This is often referred to as "stress hyperglycemia." Stress can elevate your blood glucose. This happens because of hormones put out by the body during times of stress. If stress has been the cause of your high blood glucose, it can be followed regularly by your caregiver. That way he/she can make sure your hyperglycemia does not continue to get worse or progress to diabetes. Steroids  Steroids are medications that act on the infection fighting system (immune system) to block inflammation or infection. One side effect can be a rise in blood glucose. Most  people can produce enough extra insulin to allow for this rise, but for those who cannot, steroids make blood glucose levels go even higher. It is not unusual for steroid treatments to "uncover" diabetes that is developing. It is not always possible to determine if the hyperglycemia will go away after the steroids are stopped. A special blood test called an A1c is sometimes done to determine if your blood glucose was elevated before the steroids were started. SYMPTOMS  Thirsty.  Frequent urination.  Dry mouth.  Blurred vision.  Tired or fatigue.  Weakness.  Sleepy.  Tingling in feet or leg. DIAGNOSIS  Diagnosis is made by monitoring blood glucose in one or all of the following ways:  A1c test. This is a chemical found in your blood.  Fingerstick blood glucose monitoring.  Laboratory results. TREATMENT  First, knowing the cause of the hyperglycemia is important before the hyperglycemia can be treated. Treatment may include, but is not be limited to:  Education.  Change or adjustment in medications.  Change or adjustment in meal plan.  Treatment for an illness, infection, etc.  More frequent blood glucose monitoring.  Change in exercise plan.  Decreasing or stopping steroids.  Lifestyle changes. HOME CARE INSTRUCTIONS   Test your blood glucose as directed.  Exercise regularly. Your caregiver will give you instructions about exercise. Pre-diabetes or diabetes which comes on with stress is helped by exercising.  Eat wholesome, balanced meals. Eat often and at regular, fixed times. Your caregiver or nutritionist will give you a meal plan to guide your sugar intake.  Being at an ideal weight is important. If needed, losing as little as 10 to 15 pounds may help improve blood glucose levels. SEEK MEDICAL CARE IF:   You have questions about medicine, activity, or diet.  You continue to have symptoms (problems such as increased thirst, urination, or weight gain). SEEK  IMMEDIATE MEDICAL CARE IF:   You are vomiting or have diarrhea.  Your breath smells fruity.  You are breathing faster or slower.  You are very sleepy or incoherent.  You have numbness, tingling, or pain in your feet or hands.  You have chest pain.  Your symptoms get worse even though you have been following your caregiver's orders.  If you have any other questions or concerns. Document Released: 07/23/2000 Document Revised: 04/21/2011 Document Reviewed: 05/26/2011 [facility] Patient Information 2015 [place], [place]. This information is not intended to replace advice given to you by your health care provider. Make sure you discuss any questions you have with your health care provider.

## 2013-12-20 NOTE — Assessment & Plan Note (Signed)
Etiology could be related to previous costochondritis vs noncardiac (i.e GERD uncontrolled), EKG unchanged from prior (old q waves, 1st degree AV block, NSR) Will do CXR to r/o other etiology (i.e pneumonia, vascular congestion.  CXR +Mild pulmonary vascular prominence and interstitial prominence noted. Very mild component of congestive heart failure cannot be excluded Encouraged pt to take Lasix and RTC if worsening or not resolving.

## 2013-12-20 NOTE — Assessment & Plan Note (Signed)
Will treat with Diflucan 150 mg x 1 continue nystatin cream

## 2013-12-20 NOTE — Assessment & Plan Note (Signed)
H/a today though neurologically intact Given Toradol im 30 mg x 1  RTC if not improved

## 2013-12-20 NOTE — Assessment & Plan Note (Signed)
Etiology could be viral, related to GERD, centor criteria score 1 with prob of strep 5-10% will r/o strep with rapid strep which was negative  Can try warm salt water gargles for now

## 2013-12-20 NOTE — Assessment & Plan Note (Signed)
Pt quit smoking since last visit congratulated

## 2013-12-20 NOTE — Assessment & Plan Note (Signed)
Uncontrolled (see HPI), could be causing sore throat  Disc'ed changing PPIs pt declined at this time, discussed referral back to GI pt declined at this time and will continue Prilosec 40 mg bid.

## 2013-12-20 NOTE — Progress Notes (Signed)
INTERNAL MEDICINE TEACHING ATTENDING ADDENDUM - [name], MD: I reviewed and discussed at the time of visit with the resident Dr. Aundra Dubin, the patient's medical history, physical examination, diagnosis and results of pertinent tests and treatment and I agree with the patient's care as documented.

## 2013-12-22 LAB — CULTURE, GROUP A STREP: Organism ID, Bacteria: NORMAL

## 2014-01-03 ENCOUNTER — Other Ambulatory Visit: Payer: Self-pay | Admitting: Internal Medicine

## 2014-01-03 DIAGNOSIS — F419 Anxiety disorder, unspecified: Secondary | ICD-10-CM

## 2014-01-03 NOTE — Telephone Encounter (Signed)
Called to PPA

## 2014-01-09 ENCOUNTER — Ambulatory Visit: Payer: Medicare Other | Admitting: Internal Medicine

## 2014-01-10 ENCOUNTER — Other Ambulatory Visit: Payer: Self-pay | Admitting: Internal Medicine

## 2014-01-10 DIAGNOSIS — J439 Emphysema, unspecified: Secondary | ICD-10-CM

## 2014-01-13 ENCOUNTER — Ambulatory Visit: Payer: Medicare Other | Admitting: Internal Medicine

## 2014-01-14 ENCOUNTER — Telehealth: Payer: Self-pay | Admitting: Internal Medicine

## 2014-01-14 NOTE — Telephone Encounter (Addendum)
   Reason for call:   I received a call from Ms. Shannara A Tomich at 10:00  PM indicating that she is having diarrhea.   Pertinent Data:   Pt states that she developed diarrhea starting 3 days ago and was having approx 5 loose stools a day but the number seems to have decreased now to possibly 2 today. She denies sick contacts. She denies nausea, vomiting, or abdominal pain and is able to eat and drink and is trying to stay hydrated. She denies fevers. She denies any blood in her stool or incontinence, and she states that this morning her stools became more formed but she has had a looser stool since then. She is taking a stool softener and probiotic daily as she is on chronic narcotic pain medication. She does note that she took an Imodium when the diarrhea began and her diarrhea stopped for a day, but she has not taken any since.  She states that she was admitted to the hospital in the past with diarrhea and was initially told that she did not have C.diff but later was told she did have C.diff. In looking back at her admission from 11/2012, C.diff PCR was negative but on the GI pathogen panel, C.diff toxin was positive. The diarrhea resolved so she was not treated. On outpatient f/u, her PCP noted that she may be a carrier of C.diff.    Assessment / Plan / Recommendations:   Symptoms possibly viral vs food related, but with history cannot r/o C.diff. Given that she is able to tolerate po intake and is not having any abdominal pain, nausea, vomiting, or fevers, I felt that she could wait this evening at home to see if her diarrhea improves/resolves.   She was strongly advised to stop the imodium   However she was advised that if the diarrhea became more frequent, if she developed fevers, chills, nausea, vomiting, or abdominal pain or blood in her stools or became dizzy, she should go the the ED or Urgent Care this evening for evaluation and to be tested for C.diff and given IVF.  She was advised  that if the diarrhea persists into tomorrow, she should be evaluated by a medical provider.   Otho Bellows, MD   01/14/2014, 10:06 PM

## 2014-01-15 ENCOUNTER — Telehealth: Payer: Self-pay | Admitting: Internal Medicine

## 2014-01-15 NOTE — Telephone Encounter (Signed)
  INTERNAL MEDICINE RESIDENCY PROGRAM After-Hours Telephone Call    Reason for call:   I received a call from [name] at 10:18 PM, 01/15/2014 indicating that her symptoms of diarrhea have worsened. She reports that she has had 4-5 small bowel movements. Initially the movements where large. She is not sure whether she has bloody stools, however, she has seen some blood spots after she wiped, which she attributes to her hemorrhoids. She denies nausea or vomiting. She is able to keep food and liquids down. She's been trying to rehydrate since onset of symptoms 4 days ago but she also continues with lasix 6m bid. She denies dizziness. She continues to use Lasix 80 mg twice a day. Yesterday she called my colleague, Dr. GEulas Postwith a similar symptoms. Dr. GEulas Postrecommended continuing with rehydration and if her symptoms persist to present to the ED. Apparently she has used Imodium once which helped with her diarrhea. Dr. GEulas Postrecommended against imodium.    Pertinent Data:   She was admitted to the hospital in the past with diarrhea and was initially told that she did not have C.diff but later was told she did have C.diff. 11/2012, C.diff PCR was negative but on the GI pathogen panel, C.diff toxin was positive. The diarrhea resolved so she was not treated. On outpatient f/u, her PCP noted that she may be a carrier of C.diff.     Assessment / Plan / Recommendations:   Etiology of her current diarrhea is likely viral versus food poisoning versus C.diff colitis. In the absence of acute symptoms of fevers, abdominal pain, bloody stools, nausea or vomiting, I recommended that she calls the clinic tomorrow morning to schedule an appointment to be evaluated. She verbalized understanding.  I recommended that she holds her morning Lasix dose of 80 mg in the setting of acute diarrhea. She can be advised further on how to continue diuretics during her clinic visit.  As always, pt is advised that if  symptoms worsen or new symptoms arise, they should go to an urgent care facility or to to ER for further evaluation.    RJessee Avers MD   01/15/2014, 10:18 PM

## 2014-01-16 ENCOUNTER — Encounter (HOSPITAL_COMMUNITY): Payer: Self-pay | Admitting: General Practice

## 2014-01-16 ENCOUNTER — Observation Stay (HOSPITAL_COMMUNITY)
Admission: AD | Admit: 2014-01-16 | Discharge: 2014-01-18 | Disposition: A | Discharge status: Home / Self Care | Payer: Medicare Other | Source: Ambulatory Visit | Attending: Internal Medicine | Admitting: Internal Medicine

## 2014-01-16 ENCOUNTER — Ambulatory Visit (INDEPENDENT_AMBULATORY_CARE_PROVIDER_SITE_OTHER): Payer: Medicare Other | Admitting: Internal Medicine

## 2014-01-16 ENCOUNTER — Encounter: Payer: Self-pay | Admitting: Internal Medicine

## 2014-01-16 VITALS — BP 146/58 | HR 86 | Temp 97.6°F | Ht 62.0 in

## 2014-01-16 DIAGNOSIS — E785 Hyperlipidemia, unspecified: Secondary | ICD-10-CM | POA: Insufficient documentation

## 2014-01-16 DIAGNOSIS — Z85528 Personal history of other malignant neoplasm of kidney: Secondary | ICD-10-CM | POA: Insufficient documentation

## 2014-01-16 DIAGNOSIS — F419 Anxiety disorder, unspecified: Secondary | ICD-10-CM | POA: Diagnosis not present

## 2014-01-16 DIAGNOSIS — Z952 Presence of prosthetic heart valve: Secondary | ICD-10-CM | POA: Insufficient documentation

## 2014-01-16 DIAGNOSIS — I509 Heart failure, unspecified: Secondary | ICD-10-CM | POA: Insufficient documentation

## 2014-01-16 DIAGNOSIS — E860 Dehydration: Secondary | ICD-10-CM | POA: Insufficient documentation

## 2014-01-16 DIAGNOSIS — M797 Fibromyalgia: Secondary | ICD-10-CM | POA: Diagnosis present

## 2014-01-16 DIAGNOSIS — G8929 Other chronic pain: Secondary | ICD-10-CM | POA: Diagnosis not present

## 2014-01-16 DIAGNOSIS — Z794 Long term (current) use of insulin: Secondary | ICD-10-CM | POA: Insufficient documentation

## 2014-01-16 DIAGNOSIS — E118 Type 2 diabetes mellitus with unspecified complications: Secondary | ICD-10-CM | POA: Diagnosis present

## 2014-01-16 DIAGNOSIS — R519 Headache, unspecified: Secondary | ICD-10-CM

## 2014-01-16 DIAGNOSIS — R197 Diarrhea, unspecified: Principal | ICD-10-CM | POA: Diagnosis present

## 2014-01-16 DIAGNOSIS — R079 Chest pain, unspecified: Secondary | ICD-10-CM | POA: Insufficient documentation

## 2014-01-16 DIAGNOSIS — B373 Candidiasis of vulva and vagina: Secondary | ICD-10-CM | POA: Diagnosis not present

## 2014-01-16 DIAGNOSIS — I272 Other secondary pulmonary hypertension: Secondary | ICD-10-CM | POA: Insufficient documentation

## 2014-01-16 DIAGNOSIS — E1169 Type 2 diabetes mellitus with other specified complication: Secondary | ICD-10-CM | POA: Diagnosis present

## 2014-01-16 DIAGNOSIS — K219 Gastro-esophageal reflux disease without esophagitis: Secondary | ICD-10-CM | POA: Diagnosis not present

## 2014-01-16 DIAGNOSIS — Z79899 Other long term (current) drug therapy: Secondary | ICD-10-CM | POA: Insufficient documentation

## 2014-01-16 DIAGNOSIS — Z87891 Personal history of nicotine dependence: Secondary | ICD-10-CM | POA: Insufficient documentation

## 2014-01-16 DIAGNOSIS — E1140 Type 2 diabetes mellitus with diabetic neuropathy, unspecified: Secondary | ICD-10-CM | POA: Insufficient documentation

## 2014-01-16 DIAGNOSIS — Z9981 Dependence on supplemental oxygen: Secondary | ICD-10-CM | POA: Diagnosis not present

## 2014-01-16 DIAGNOSIS — Z6837 Body mass index (BMI) 37.0-37.9, adult: Secondary | ICD-10-CM | POA: Diagnosis not present

## 2014-01-16 DIAGNOSIS — F329 Major depressive disorder, single episode, unspecified: Secondary | ICD-10-CM | POA: Insufficient documentation

## 2014-01-16 DIAGNOSIS — J439 Emphysema, unspecified: Secondary | ICD-10-CM | POA: Diagnosis not present

## 2014-01-16 DIAGNOSIS — J449 Chronic obstructive pulmonary disease, unspecified: Secondary | ICD-10-CM | POA: Diagnosis not present

## 2014-01-16 DIAGNOSIS — E669 Obesity, unspecified: Secondary | ICD-10-CM | POA: Diagnosis not present

## 2014-01-16 DIAGNOSIS — R1030 Lower abdominal pain, unspecified: Secondary | ICD-10-CM | POA: Diagnosis not present

## 2014-01-16 DIAGNOSIS — I480 Paroxysmal atrial fibrillation: Secondary | ICD-10-CM | POA: Diagnosis not present

## 2014-01-16 DIAGNOSIS — G4733 Obstructive sleep apnea (adult) (pediatric): Secondary | ICD-10-CM | POA: Insufficient documentation

## 2014-01-16 DIAGNOSIS — Z7901 Long term (current) use of anticoagulants: Secondary | ICD-10-CM | POA: Diagnosis not present

## 2014-01-16 HISTORY — DX: Migraine, unspecified, not intractable, without status migrainosus: G43.909

## 2014-01-16 HISTORY — DX: Headache, unspecified: R51.9

## 2014-01-16 LAB — CBC
HCT: 35 % — ABNORMAL LOW (ref 36.0–46.0)
Hemoglobin: 11.3 g/dL — ABNORMAL LOW (ref 12.0–15.0)
MCH: 29.4 pg (ref 26.0–34.0)
MCHC: 32.3 g/dL (ref 30.0–36.0)
MCV: 91.1 fL (ref 78.0–100.0)
Platelets: 199 10*3/uL (ref 150–400)
RBC: 3.84 MIL/uL — ABNORMAL LOW (ref 3.87–5.11)
RDW: 13.7 % (ref 11.5–15.5)
WBC: 7.1 10*3/uL (ref 4.0–10.5)

## 2014-01-16 LAB — GLUCOSE, CAPILLARY: Glucose-Capillary: 220 mg/dL — ABNORMAL HIGH (ref 70–99)

## 2014-01-16 LAB — MAGNESIUM: Magnesium: 1.9 mg/dL (ref 1.5–2.5)

## 2014-01-16 LAB — URINALYSIS, ROUTINE W REFLEX MICROSCOPIC
Bilirubin Urine: NEGATIVE
Glucose, UA: NEGATIVE mg/dL
Ketones, ur: NEGATIVE mg/dL
Nitrite: NEGATIVE
Protein, ur: NEGATIVE mg/dL
Specific Gravity, Urine: 1.009 (ref 1.005–1.030)
Urobilinogen, UA: 0.2 mg/dL (ref 0.0–1.0)
pH: 5.5 (ref 5.0–8.0)

## 2014-01-16 LAB — URINE MICROSCOPIC-ADD ON

## 2014-01-16 LAB — PROTIME-INR
INR: 1.84 — ABNORMAL HIGH (ref 0.00–1.49)
Prothrombin Time: 21.4 seconds — ABNORMAL HIGH (ref 11.6–15.2)

## 2014-01-16 LAB — COMPREHENSIVE METABOLIC PANEL
ALT: 31 U/L (ref 0–35)
AST: 26 U/L (ref 0–37)
Albumin: 3.8 g/dL (ref 3.5–5.2)
Alkaline Phosphatase: 48 U/L (ref 39–117)
Anion gap: 14 (ref 5–15)
BUN: 14 mg/dL (ref 6–23)
CO2: 25 mEq/L (ref 19–32)
Calcium: 9.8 mg/dL (ref 8.4–10.5)
Chloride: 98 mEq/L (ref 96–112)
Creatinine, Ser: 0.99 mg/dL (ref 0.50–1.10)
GFR calc Af Amer: 68 mL/min — ABNORMAL LOW (ref >90)
GFR calc non Af Amer: 59 mL/min — ABNORMAL LOW (ref >90)
Glucose, Bld: 139 mg/dL — ABNORMAL HIGH (ref 70–99)
Potassium: 4.1 mEq/L (ref 3.7–5.3)
Sodium: 137 mEq/L (ref 137–147)
Total Bilirubin: 0.3 mg/dL (ref 0.3–1.2)
Total Protein: 6.9 g/dL (ref 6.0–8.3)

## 2014-01-16 LAB — APTT: aPTT: 35 seconds (ref 24–37)

## 2014-01-16 LAB — TROPONIN I: Troponin I: <0.3 ng/mL (ref <0.30)

## 2014-01-16 MED ORDER — SODIUM CHLORIDE 0.9 % IJ SOLN
3.0000 mL | Freq: Two times a day (BID) | INTRAMUSCULAR | Status: DC
  Administered 2014-01-17: 3 mL via INTRAVENOUS

## 2014-01-16 MED ORDER — ALBUTEROL SULFATE HFA 108 (90 BASE) MCG/ACT IN AERS
2.0000 | INHALATION_SPRAY | Freq: Four times a day (QID) | RESPIRATORY_TRACT | Status: DC | PRN
Start: 2014-01-16 — End: 2014-01-16
  Filled 2014-01-16: qty 6.7

## 2014-01-16 MED ORDER — ACETAMINOPHEN 650 MG RE SUPP: 650.0000 mg | Freq: Four times a day (QID) | RECTAL | Status: DC | PRN

## 2014-01-16 MED ORDER — ROSUVASTATIN CALCIUM 20 MG PO TABS
20.0000 mg | ORAL_TABLET | Freq: Every day | ORAL | Status: DC
  Administered 2014-01-16 – 2014-01-17 (×2): 20 mg via ORAL
  Filled 2014-01-16 (×3): qty 1

## 2014-01-16 MED ORDER — WARFARIN - PHARMACIST DOSING INPATIENT: Freq: Every day | Status: DC

## 2014-01-16 MED ORDER — NICOTINE 14 MG/24HR TD PT24
14.0000 mg | MEDICATED_PATCH | Freq: Every day | TRANSDERMAL | Status: DC
  Administered 2014-01-16 – 2014-01-17 (×2): 14 mg via TRANSDERMAL
  Filled 2014-01-16 (×3): qty 1

## 2014-01-16 MED ORDER — ACETAMINOPHEN 325 MG PO TABS: 650.0000 mg | ORAL_TABLET | Freq: Four times a day (QID) | ORAL | Status: DC | PRN

## 2014-01-16 MED ORDER — TIOTROPIUM BROMIDE MONOHYDRATE 18 MCG IN CAPS
18.0000 ug | ORAL_CAPSULE | Freq: Every day | RESPIRATORY_TRACT | Status: DC
  Administered 2014-01-17 – 2014-01-18 (×2): 18 ug via RESPIRATORY_TRACT
  Filled 2014-01-16: qty 5

## 2014-01-16 MED ORDER — PAROXETINE HCL 20 MG PO TABS
40.0000 mg | ORAL_TABLET | Freq: Every day | ORAL | Status: DC
  Administered 2014-01-17 – 2014-01-18 (×2): 40 mg via ORAL
  Filled 2014-01-16 (×2): qty 2

## 2014-01-16 MED ORDER — INSULIN ASPART 100 UNIT/ML ~~LOC~~ SOLN
0.0000 [IU] | Freq: Three times a day (TID) | SUBCUTANEOUS | Status: DC
  Administered 2014-01-17 (×2): 5 [IU] via SUBCUTANEOUS
  Administered 2014-01-17: 11 [IU] via SUBCUTANEOUS
  Administered 2014-01-18: 3 [IU] via SUBCUTANEOUS
  Administered 2014-01-18: 5 [IU] via SUBCUTANEOUS

## 2014-01-16 MED ORDER — INSULIN DETEMIR 100 UNIT/ML ~~LOC~~ SOLN
30.0000 [IU] | Freq: Two times a day (BID) | SUBCUTANEOUS | Status: DC
  Administered 2014-01-16 – 2014-01-17 (×2): 30 [IU] via SUBCUTANEOUS
  Filled 2014-01-16 (×3): qty 0.3

## 2014-01-16 MED ORDER — ALPRAZOLAM 0.5 MG PO TABS
0.5000 mg | ORAL_TABLET | Freq: Two times a day (BID) | ORAL | Status: DC | PRN
  Administered 2014-01-16 – 2014-01-17 (×4): 0.5 mg via ORAL
  Administered 2014-01-17: 1 mg via ORAL
  Filled 2014-01-16: qty 2
  Filled 2014-01-16: qty 1
  Filled 2014-01-16: qty 2
  Filled 2014-01-16: qty 1
  Filled 2014-01-16: qty 2

## 2014-01-16 MED ORDER — ALBUTEROL SULFATE HFA 108 (90 BASE) MCG/ACT IN AERS: 2.0000 | INHALATION_SPRAY | Freq: Four times a day (QID) | RESPIRATORY_TRACT | Status: DC | PRN

## 2014-01-16 MED ORDER — ALBUTEROL SULFATE (2.5 MG/3ML) 0.083% IN NEBU: 2.5000 mg | INHALATION_SOLUTION | Freq: Four times a day (QID) | RESPIRATORY_TRACT | Status: DC | PRN

## 2014-01-16 MED ORDER — MOMETASONE FURO-FORMOTEROL FUM 100-5 MCG/ACT IN AERO
2.0000 | INHALATION_SPRAY | Freq: Two times a day (BID) | RESPIRATORY_TRACT | Status: DC
  Administered 2014-01-17 – 2014-01-18 (×2): 2 via RESPIRATORY_TRACT
  Filled 2014-01-16 (×3): qty 8.8

## 2014-01-16 MED ORDER — FLUTICASONE PROPIONATE 50 MCG/ACT NA SUSP
2.0000 | Freq: Every day | NASAL | Status: DC
Start: 2014-01-16 — End: 2014-01-18
  Administered 2014-01-16 – 2014-01-18 (×3): 2 via NASAL
  Filled 2014-01-16: qty 16

## 2014-01-16 MED ORDER — FLUCONAZOLE 150 MG PO TABS
150.0000 mg | ORAL_TABLET | Freq: Once | ORAL | Status: AC
  Administered 2014-01-16: 150 mg via ORAL
  Filled 2014-01-16: qty 1

## 2014-01-16 MED ORDER — WARFARIN SODIUM 2.5 MG PO TABS
2.5000 mg | ORAL_TABLET | Freq: Once | ORAL | Status: AC
  Administered 2014-01-16: 2.5 mg via ORAL
  Filled 2014-01-16 (×2): qty 1

## 2014-01-16 MED ORDER — HYDROCODONE-ACETAMINOPHEN 5-325 MG PO TABS
1.0000 | ORAL_TABLET | Freq: Four times a day (QID) | ORAL | Status: DC | PRN
  Administered 2014-01-16 – 2014-01-18 (×5): 2 via ORAL
  Filled 2014-01-16 (×5): qty 2

## 2014-01-16 MED ORDER — GABAPENTIN 300 MG PO CAPS
300.0000 mg | ORAL_CAPSULE | Freq: Two times a day (BID) | ORAL | Status: DC
  Administered 2014-01-16 – 2014-01-18 (×4): 300 mg via ORAL
  Filled 2014-01-16 (×5): qty 1

## 2014-01-16 MED ORDER — ALBUTEROL SULFATE (2.5 MG/3ML) 0.083% IN NEBU
2.5000 mg | INHALATION_SOLUTION | Freq: Four times a day (QID) | RESPIRATORY_TRACT | Status: DC | PRN
Start: 2014-01-16 — End: 2014-01-18
  Administered 2014-01-17 – 2014-01-18 (×4): 2.5 mg via RESPIRATORY_TRACT
  Filled 2014-01-16 (×4): qty 3

## 2014-01-16 MED ORDER — POTASSIUM CHLORIDE CRYS ER 20 MEQ PO TBCR
20.0000 meq | EXTENDED_RELEASE_TABLET | Freq: Two times a day (BID) | ORAL | Status: DC
  Administered 2014-01-16 – 2014-01-18 (×4): 20 meq via ORAL
  Filled 2014-01-16 (×6): qty 1

## 2014-01-16 MED ORDER — SODIUM CHLORIDE 0.9 % IV SOLN
INTRAVENOUS | Status: DC
  Administered 2014-01-16 – 2014-01-18 (×4): via INTRAVENOUS

## 2014-01-16 NOTE — Assessment & Plan Note (Signed)
Pt with non-bloody diarrhea for 5 days. Now with decreased po intake and an episode of stool incontinence in her sleep. +Orthostatics from lying to sitting. H/o C.diff positive stool 11/2012 that was not treated as her diarrhea had resolved, and she was thought to be a carrier for C.diff but not infected. She denies sick contacts or really any contact with folks other than her sister, who is not sick, except at Thanksgiving, use of well water, food borne illness, or recent antibiotic use. I am concerned that this could be C.diff infection. Given her significant co-morbidities, she can easily continue to worsen and become very ill, so she will need to be admitted overnight.  - Will admit to telemetry to observation - She will need light hydration given CHF - Obtain C.diff PCR and stool pathogen panel

## 2014-01-16 NOTE — Addendum Note (Signed)
Addended by: Hulan Fray on: 01/16/2014 05:15 PM   Modules accepted: Orders

## 2014-01-16 NOTE — Progress Notes (Signed)
Patient ID: [name], female   DOB: 02-May-1950, 63 y.o.   MRN: [id number]  Subjective:   Patient ID: DONALEE GAUMOND female   DOB: 1950-06-25 63 y.o.   MRN: [id number]  HPI: Ms.Illyana A Bartles is a 63 y.o. F w/ PMH DM2, emphysema, depression, anxiety, PAF, CHF, osteoporosis, chronic venous insufficiency, pulmonary hypertension, and h/o C.diff positivity presents with diarrhea.  She states that she has had diarrhea for 5 days and it is getting worse. She is now having diarrhea every 15 minutes to couple of hours. She denies eating anything that was old, raw meat, or seafood. She drinks city water and not well water. She has not recently used antibiotics. She is unsure if she is having fevers and and chills. She denies nausea, vomiting, abdominal pain, or blood in her stool and has been drinking fluids. However, she states that she has not eaten since yesterday, has decreased appetite, and she feels dehydrated. She also endorses and episode of stool incontinence in her bed overnight.   She called Saturday PM and _0 14 TEE w PA peak pressure 46 mmHg, s/p MV replacement   . Tobacco abuse 07/28/2012  . Hyperlipidemia LDL goal < 100 11/20/2005  . Carotid artery stenosis     s/p right endarterectomy (06/2010) Carotid US (07/2010):  Left: Moderate-to-severe (60-79%) calcific and  non-calcific plaque origin and proximal ICA and ECA   . Mitral stenosis     s/p Mitral valve replacement with a 27-mm pericardial porcine valve (Medtronic Mosaic valve, serial #[id number] on 09/20/10, Dr. Prescott Gum)   . Chronic congestive heart failure with left ventricular diastolic dysfunction 3/35/8251  . Depression 11/19/2005  . Anxiety 07/24/2010  . Fibromyalgia 08/29/2010  . Obesity, Class II, BMI 35-39.9 10/23/2011  . Adenomatous polyps 05/14/2011    Colonoscopy (05/2011): 4 mm adenomatous polyp excised endoscopically Colonoscopy (02/2002): Adenomatous polyp excised endoscopically   . Gastroesophageal reflux disease   . Internal hemorrhoids 08/04/2012  . Osteoporosis     DEXA (12/09/2011): L-spine T -3.7, left hip T -1.4 DEXA (12/2004): L-spine T -2.6, left hip -0.1   . Allergic rhinitis 06/01/2012  . Bilateral cataracts 08/04/2012    Visually insignificant   . Chronic constipation 02/03/2011  . Chronic venous insufficiency 08/04/2012  . Paroxysmal atrial fibrillation 10/22/2010    s/p Left atrial maze procedure for paroxysmal atrial fibrillation on 09/20/2010 by Dr Prescott Gum.  Subsequent splenic infarct, decision was made to re-anticoagulate with coumadin, likely life-long as this is the most likely cause of the splenic infarct.   . Chronic low back pain 10/06/2012  . Anemia of chronic disease 01/01/2013  . Pneumonia     "twice" (01/07/2013)  . Asthmatic bronchitis     "hx; don't get it anymore" (01/07/2013)  . Exertional shortness of breath   .  Obstructive sleep apnea     Nocturnal polysomnography (06/2009): Moderate sleep apnea/ hypopnea syndrome , AHI 17.8 per hour with nonpositional hypopneas. CPAP titration to 12 CWP, AHI 2.4 per hour. On nocturnal CPAP via a small resMed Quattro full-face mask with heated humidifier.   . On home oxygen therapy     "2L q hs; use it in the day when I need it" (01/07/2013)  . Type 2 diabetes mellitus with diabetic neuropathy   . History of blood  transfusion     "several times" (01/07/2013)  . H/O hiatal hernia   . Arthritis     "back and hands" (01/07/2013)  . Clear cell renal cell carcinoma 07/21/2011    s/p cryoablation of left RCC in 09/2011 by [name]. Followed by [name]  [facility]) .    Marland Kitchen Chronic daily headache 01/14/2013   Current Outpatient Prescriptions  Medication Sig Dispense Refill  . albuterol (PROAIR HFA) 108 (90 BASE) MCG/ACT inhaler Inhale 2 puffs into the lungs every 6 (six) hours as needed for wheezing or shortness of breath. 1 Inhaler 11  . ALPRAZolam (XANAX) 1 MG tablet Take 0.5-1 tablets (0.5-1 mg total) by mouth 2 (two) times daily as needed for anxiety. 60 tablet 5  . Calcium Carb-Cholecalciferol 600-800 MG-UNIT TABS Take 1 tablet by mouth 2 (two) times daily.     . chlorpheniramine (CHLOR-TRIMETON) 4 MG tablet Take 1 tablet (4 mg total) by mouth 3 (three) times daily as needed for allergies. 90 tablet 11  . docusate sodium (COLACE) 50 MG capsule Take 100 mg by mouth every morning. Can Alternate with  senokot    . EASY TOUCH PEN NEEDLES 31G X 5 MM MISC USE TO INJECT INSULIN 5 TIMES DAILY 200 each 8  . ferrous gluconate (FERGON) 324 MG tablet Take 1 tablet (324 mg total) by mouth 2 (two) times daily with a meal. 60 tablet 3  . fluconazole (DIFLUCAN) 150 MG tablet Take 1 tablet (150 mg total) by mouth once. 1 tablet 0  . fluticasone (FLONASE) 50 MCG/ACT nasal spray Place 2 sprays into both nostrils daily. 16 g 11  . Fluticasone-Salmeterol (ADVAIR DISKUS) 250-50 MCG/DOSE AEPB Inhale 1 puff into the lungs 2 (two) times daily. 180 each 2  . furosemide (LASIX) 80 MG tablet Take 1 tablet (80 mg total) by mouth 2 (two) times daily. 180 tablet 3  . gabapentin (NEURONTIN) 300 MG capsule Take 1 capsule (300 mg total) by mouth 2 (two) times daily. 180 capsule 3  . HYDROcodone-acetaminophen (NORCO/VICODIN) 5-325 MG per tablet Take 1-2 tablets by mouth every 6 (six) hours as needed for severe pain. 180 tablet  0  . Insulin Detemir (LEVEMIR) 100 UNIT/ML Pen Inject 42 Units into the skin 2 (two) times daily. 15 mL 11  . insulin lispro (HUMALOG) 100 UNIT/ML KiwkPen Inject 0.18 mLs (18 Units total) into the skin every morning. 18 mL 11  . Lancets Misc. (ACCU-CHEK FASTCLIX LANCET) KIT Check your blood 4 times a day dx code 250.00 insulin requiring 1 kit 2  . nystatin cream (MYCOSTATIN) Apply 1 application topically 2 (two) times daily.    Marland Kitchen omeprazole (PRILOSEC) 40 MG capsule Take 1 capsule (40 mg total) by mouth daily. 90 capsule 3  . PARoxetine (PAXIL) 40 MG tablet Take 1 tablet (40 mg total) by mouth every morning. 90 tablet 3  . potassium chloride SA (K-DUR,KLOR-CON) 20 MEQ tablet Take 1 tablet (20 mEq total) by mouth 2 (two) times daily. 180 tablet 3  .  Probiotic CAPS Take 1 capsule by mouth daily. 90 capsule 3  . promethazine (PHENERGAN) 12.5 MG tablet Take 1 tablet (12.5 mg total) by mouth every 6 (six) hours as needed for nausea or vomiting. 30 tablet 11  . rosuvastatin (CRESTOR) 20 MG tablet Take 1 tablet (20 mg total) by mouth daily. 90 tablet 3  . tiotropium (SPIRIVA HANDIHALER) 18 MCG inhalation capsule Place 1 capsule (18 mcg total) into inhaler and inhale daily. 90 capsule 3  . warfarin (COUMADIN) 5 MG tablet Take 0.5 tablets (2.5 mg total) by mouth daily at 6 PM. Except on Wednesdays when you skip the dose. 45 tablet 2   No current facility-administered medications for this visit.   Family History  Problem Relation Age of Onset  . Peptic Ulcer Disease Father   . Heart attack Father 23    Died of MI at age 68  . Heart attack Brother 49    Died of MI at age 49  . Obesity Brother   . Pneumonia Mother   . Healthy Sister   . Lupus Daughter   . Obsessive Compulsive Disorder Daughter    History   Social History  . Marital Status: Divorced    Spouse Name: N/A    Number of Children: N/A  . Years of Education: N/A   Social History Main Topics  . Smoking status: Former Smoker -- 1.50  packs/day for 50 years    Types: Cigarettes    Start date: 10/31/2013  . Smokeless tobacco: Never Used     Comment: 1.5-2pks a day  . Alcohol Use: No  . Drug Use: No  . Sexual Activity: No   Other Topics Concern  . None   Social History Narrative   Lives alone in [place] ([place]) with 60 pound chow   Worked at [employer] for 18 years   No car   Review of Systems: A 12 point ROS was performed; pertinent positives and negatives were noted in the HPI   Objective:  Physical Exam: Filed Vitals:   01/16/14 1529 01/16/14 1600 01/16/14 1602 01/16/14 1604  BP: 125/63 159/62 139/61 146/58  Pulse: 81 83 83 86  Temp: 97.6 F (36.4 C)     TempSrc: Oral Lying Sitting  Standing  Height: _0  (1.575 m)     SpO2: 97%      Constitutional: Vital signs reviewed.  Patient is a well-developed and well-nourished female in no acute distress and cooperative with exam. Alert and oriented x3.  Head: Normocephalic and atraumatic Eyes: PERRL, EOMI. No scleral icterus.  Mouth: MMM, tongue slightly dry Cardiovascular: RRR, no MRG Pulmonary/Chest: Normal respiratory effort, CTAB Abdominal: Soft. Non-tender, non-distended, bowel sounds are hyperactive, no guarding present.  Musculoskeletal: Moves all 4 extremities Neurological: A&O x3, cranial nerve II-XII are grossly intact.  Skin: Warm and dry. No tenting.   Psychiatric: Normal mood and affect. speech and behavior is normal. Judgment normal.   Assessment & Plan:   Please refer to Problem List based Assessment and Plan

## 2014-01-16 NOTE — Progress Notes (Addendum)
ANTICOAGULATION CONSULT NOTE - Initial Consult  Pharmacy Consult for Warfarin Indication: atrial fibrillation  Allergies  Allergen Reactions  . Lorazepam     Patient's sister noted that ativan caused the patient to become extremely confused during hospitalization 09/2010  . Morphine And Related Other (See Comments)  . Oxycontin [Oxycodone] Other (See Comments)    headache  . Tramadol Hcl     REACTION: swelling    Patient Measurements: Height: _0  (157.5 cm) Weight: 203 lb 8 oz (92.307 kg) IBW/kg (Calculated) : 50.1  Vital Signs: Temp: 98.2 F (36.8 C) (12/07 1710) Temp Source: Oral (12/07 1710) BP: 125/63 mmHg (12/07 1710) Pulse Rate: 80 (12/07 1710)  Labs: No results for input(s): HGB, HCT, PLT, APTT, LABPROT, INR, HEPARINUNFRC, CREATININE, CKTOTAL, CKMB, TROPONINI in the last 72 hours.  Estimated Creatinine Clearance: 60.7 mL/min (by C-G formula based on Cr of 0.99).   Medical History: Past Medical History  Diagnosis Date  . COPD (chronic obstructive pulmonary disease) with emphysema     PFTs 02/2012: FEV1 0.92 (40%), ratio 69, 27% increase in FEV1 with BD, TLC 91%, severe airtrapping, DLCO49% On chronic home O2. Pulmonary rehab referral 05/2012   . Moderate to severe pulmonary hypertension     2014 TEE w PA peak pressure 46 mmHg, s/p MV replacement   . Tobacco abuse 07/28/2012  . Hyperlipidemia LDL goal < 100 11/20/2005  . Carotid artery stenosis     s/p right endarterectomy (06/2010) Carotid US (07/2010):  Left: Moderate-to-severe (60-79%) calcific and non-calcific plaque origin and proximal ICA and ECA   . Mitral stenosis     s/p Mitral valve replacement with a 27-mm pericardial porcine valve (Medtronic Mosaic valve, serial #[id number] on 09/20/10, Dr. Prescott Gum)   . Chronic congestive heart failure with left ventricular diastolic dysfunction 6/96/2952  . Depression 11/19/2005  . Anxiety 07/24/2010  . Fibromyalgia 08/29/2010  . Obesity, Class II, BMI 35-39.9  10/23/2011  . Adenomatous polyps 05/14/2011    Colonoscopy (05/2011): 4 mm adenomatous polyp excised endoscopically Colonoscopy (02/2002): Adenomatous polyp excised endoscopically   . Gastroesophageal reflux disease   . Internal hemorrhoids 08/04/2012  . Osteoporosis     DEXA (12/09/2011): L-spine T -3.7, left hip T -1.4 DEXA (12/2004): L-spine T -2.6, left hip -0.1   . Allergic rhinitis 06/01/2012  . Bilateral cataracts 08/04/2012    Visually insignificant   . Chronic constipation 02/03/2011  . Chronic venous insufficiency 08/04/2012  . Paroxysmal atrial fibrillation 10/22/2010    s/p Left atrial maze procedure for paroxysmal atrial fibrillation on 09/20/2010 by Dr Prescott Gum.  Subsequent splenic infarct, decision was made to re-anticoagulate with coumadin, likely life-long as this is the most likely cause of the splenic infarct.   . Chronic low back pain 10/06/2012  . Anemia of chronic disease 01/01/2013  . Pneumonia     "twice" (01/07/2013)  . Asthmatic bronchitis     "hx; don't get it anymore" (01/07/2013)  . Exertional shortness of breath   . Obstructive sleep apnea     Nocturnal polysomnography (06/2009): Moderate sleep apnea/ hypopnea syndrome , AHI 17.8 per hour with nonpositional hypopneas. CPAP titration to 12 CWP, AHI 2.4 per hour. On nocturnal CPAP via a small resMed Quattro full-face mask with heated humidifier.   . On home oxygen therapy     "2L q hs; use it in the day when I need it" (01/07/2013)  . Type 2 diabetes mellitus with diabetic neuropathy   . History of blood transfusion     "  several times" (01/07/2013)  . H/O hiatal hernia   . Arthritis     "back and hands" (01/07/2013)  . Clear cell renal cell carcinoma 07/21/2011    s/p cryoablation of left RCC in 09/2011 by [name]. Followed by [name]  [facility]) .    Marland Kitchen Chronic daily headache 01/14/2013    Medications:  Prescriptions prior to admission  Medication Sig Dispense Refill Last Dose  . albuterol  (PROAIR HFA) 108 (90 BASE) MCG/ACT inhaler Inhale 2 puffs into the lungs every 6 (six) hours as needed for wheezing or shortness of breath. 1 Inhaler 11   . ALPRAZolam (XANAX) 1 MG tablet Take 0.5-1 tablets (0.5-1 mg total) by mouth 2 (two) times daily as needed for anxiety. 60 tablet 5   . Calcium Carb-Cholecalciferol 600-800 MG-UNIT TABS Take 1 tablet by mouth 2 (two) times daily.    Taking  . chlorpheniramine (CHLOR-TRIMETON) 4 MG tablet Take 1 tablet (4 mg total) by mouth 3 (three) times daily as needed for allergies. 90 tablet 11 Not Taking  . docusate sodium (COLACE) 50 MG capsule Take 100 mg by mouth every morning. Can Alternate with  senokot   Taking  . EASY TOUCH PEN NEEDLES 31G X 5 MM MISC USE TO INJECT INSULIN 5 TIMES DAILY 200 each 8 Taking  . ferrous gluconate (FERGON) 324 MG tablet Take 1 tablet (324 mg total) by mouth 2 (two) times daily with a meal. 60 tablet 3 Taking  . fluconazole (DIFLUCAN) 150 MG tablet Take 1 tablet (150 mg total) by mouth once. 1 tablet 0 Not Taking  . fluticasone (FLONASE) 50 MCG/ACT nasal spray Place 2 sprays into both nostrils daily. 16 g 11 Taking  . Fluticasone-Salmeterol (ADVAIR DISKUS) 250-50 MCG/DOSE AEPB Inhale 1 puff into the lungs 2 (two) times daily. 180 each 2 Taking  . furosemide (LASIX) 80 MG tablet Take 1 tablet (80 mg total) by mouth 2 (two) times daily. 180 tablet 3 Taking  . gabapentin (NEURONTIN) 300 MG capsule Take 1 capsule (300 mg total) by mouth 2 (two) times daily. 180 capsule 3 Taking  . HYDROcodone-acetaminophen (NORCO/VICODIN) 5-325 MG per tablet Take 1-2 tablets by mouth every 6 (six) hours as needed for severe pain. 180 tablet 0 Taking  . Insulin Detemir (LEVEMIR) 100 UNIT/ML Pen Inject 42 Units into the skin 2 (two) times daily. 15 mL 11 Taking  . insulin lispro (HUMALOG) 100 UNIT/ML KiwkPen Inject 0.18 mLs (18 Units total) into the skin every morning. 18 mL 11 Taking  . Lancets Misc. (ACCU-CHEK FASTCLIX LANCET) KIT Check your blood  4 times a day dx code 250.00 insulin requiring 1 kit 2 Taking  . nystatin cream (MYCOSTATIN) Apply 1 application topically 2 (two) times daily.   Taking  . omeprazole (PRILOSEC) 40 MG capsule Take 1 capsule (40 mg total) by mouth daily. 90 capsule 3 Taking  . PARoxetine (PAXIL) 40 MG tablet Take 1 tablet (40 mg total) by mouth every morning. 90 tablet 3 Taking  . potassium chloride SA (K-DUR,KLOR-CON) 20 MEQ tablet Take 1 tablet (20 mEq total) by mouth 2 (two) times daily. 180 tablet 3 Taking  . Probiotic CAPS Take 1 capsule by mouth daily. 90 capsule 3 Taking  . promethazine (PHENERGAN) 12.5 MG tablet Take 1 tablet (12.5 mg total) by mouth every 6 (six) hours as needed for nausea or vomiting. 30 tablet 11 Taking  . rosuvastatin (CRESTOR) 20 MG tablet Take 1 tablet (20 mg total) by mouth daily. 90 tablet  3 Taking  . tiotropium (SPIRIVA HANDIHALER) 18 MCG inhalation capsule Place 1 capsule (18 mcg total) into inhaler and inhale daily. 90 capsule 3 Taking  . warfarin (COUMADIN) 5 MG tablet Take 0.5 tablets (2.5 mg total) by mouth daily at 6 PM. Except on Wednesdays when you skip the dose. 45 tablet 2 Taking   Scheduled:  . fluticasone  2 spray Each Nare Daily  . gabapentin  300 mg Oral BID  . [START ON 01/17/2014] insulin aspart  0-15 Units Subcutaneous TID WC  . insulin detemir  30 Units Subcutaneous BID  . mometasone-formoterol  2 puff Inhalation BID  . nicotine  14 mg Transdermal Daily  . [START ON 01/17/2014] PARoxetine  40 mg Oral [id number]  . potassium chloride SA  20 mEq Oral BID  . rosuvastatin  20 mg Oral q1800  . sodium chloride  3 mL Intravenous Q12H  . tiotropium  18 mcg Inhalation Daily    Assessment: 63yo female presents with diarrhea x 5d. Pharmacy is consulted to dose warfarin for atrial fibrillation. Pt reports taking warfarin 2.65m PO each day. INR on admission is therapeutic within error at 1.84 , Hgb 11.3, Plt wnl.  Goal of Therapy:  INR 2-3 Monitor platelets by  anticoagulation protocol: Yes   Plan:  Warfarin 2.565mtonight x 1 Daily INR Continue to monitor H&H and platelets  Monitor s/sx of bleeding  CoAndrey CotaBaDiona FoleyPharmD Clinical Pharmacist Pager [phone]/08/2013,6:08 PM

## 2014-01-16 NOTE — Progress Notes (Signed)
Pt.'s CPAP is set up at the bedside via [facility] with 2L O2 bled in. Pt. States that she will place herself on CPAP before going to bed.

## 2014-01-16 NOTE — H&P (Signed)
Date: 01/16/2014               Patient Name:  [name] MRN: [id number]  DOB: November 16, 1950 Age / Sex: 63 y.o., female   PCP: Karren Cobble, MD         Medical Service: Internal Medicine Teaching Service         Attending Physician: Dr. [name], MD    First Contact: Dr. Randell Patient Pager: [phone]  Second Contact: Dr. Redmond Pulling Pager: [phone]       After Hours (After 5p/  First Contact Pager: [phone]  weekends / holidays): Second Contact Pager: [phone]   Chief Complaint: diarrhea  History of Present Illness: [name] is a 63 year old woman with history DM2, COPD on 2L home O2 at night and prn, OSA on CPAP, pulmonary HTN, PAF on Coumadin, CHF, GERD, and h/o C.diff, depression, anxiety presents with diarrhea. Reports the diarrhea started 5 days ago. It was initially watery and now is loose. She has had similar diarrhea 12/03/2012 and was at that time positive for C diff toxin. By PCR she was negative. No food identified as inciting. No recent antibiotics. No sick contacts. Lives by herself at home. No recent hospitalizations. No frequent visits to nursing homes or similar facilities. Last EGD/colonoscopy April 2013 with tubular adenoma, small internal hemorrhoids  She has had subjective fevers, no chills. Some nausea and abdominal pain. Abdominal pain in the lower quadrants that comes and goes, cramping in nature. She denies cough, shortness of breath, changes in chronic intermittent chest pain, vomiting, hematochezia, melena, dysuria, hematuria, vaginal discharge. Reports yeast infection around buttocks.   Meds: Medications Prior to Admission  Medication Sig Dispense Refill  . albuterol (PROAIR HFA) 108 (90 BASE) MCG/ACT inhaler Inhale 2 puffs into the lungs every 6 (six) hours as needed for wheezing or shortness of breath. 1 Inhaler 11  . ALPRAZolam (XANAX) 1 MG tablet Take 0.5-1 tablets (0.5-1 mg total) by mouth 2 (two) times daily as needed for anxiety. 60 tablet 5    . Calcium Carb-Cholecalciferol 600-800 MG-UNIT TABS Take 1 tablet by mouth 2 (two) times daily.     . chlorpheniramine (CHLOR-TRIMETON) 4 MG tablet Take 1 tablet (4 mg total) by mouth 3 (three) times daily as needed for allergies. 90 tablet 11  . docusate sodium (COLACE) 50 MG capsule Take 100 mg by mouth every morning. Can Alternate with  senokot    . EASY TOUCH PEN NEEDLES 31G X 5 MM MISC USE TO INJECT INSULIN 5 TIMES DAILY 200 each 8  . ferrous gluconate (FERGON) 324 MG tablet Take 1 tablet (324 mg total) by mouth 2 (two) times daily with a meal. 60 tablet 3  . fluticasone (FLONASE) 50 MCG/ACT nasal spray Place 2 sprays into both nostrils daily. 16 g 11  . Fluticasone-Salmeterol (ADVAIR DISKUS) 250-50 MCG/DOSE AEPB Inhale 1 puff into the lungs 2 (two) times daily. 180 each 2  . furosemide (LASIX) 80 MG tablet Take 1 tablet (80 mg total) by mouth 2 (two) times daily. 180 tablet 3  . gabapentin (NEURONTIN) 300 MG capsule Take 1 capsule (300 mg total) by mouth 2 (two) times daily. 180 capsule 3  . HYDROcodone-acetaminophen (NORCO/VICODIN) 5-325 MG per tablet Take 1-2 tablets by mouth every 6 (six) hours as needed for severe pain. 180 tablet 0  . Insulin Detemir (LEVEMIR) 100 UNIT/ML Pen Inject 42 Units into the skin 2 (two) times daily. (Patient taking differently: Inject 45 Units into the skin  2 (two) times daily. ) 15 mL 11  . insulin lispro (HUMALOG) 100 UNIT/ML KiwkPen Inject 0.18 mLs (18 Units total) into the skin every morning. (Patient taking differently: Inject 18 Units into the skin 3 (three) times daily. ) 18 mL 11  . Lancets Misc. (ACCU-CHEK FASTCLIX LANCET) KIT Check your blood 4 times a day dx code 250.00 insulin requiring 1 kit 2  . nystatin cream (MYCOSTATIN) Apply 1 application topically 2 (two) times daily.    Marland Kitchen omeprazole (PRILOSEC) 40 MG capsule Take 1 capsule (40 mg total) by mouth daily. 90 capsule 3  . PARoxetine (PAXIL) 40 MG tablet Take 1 tablet (40 mg total) by mouth every  morning. 90 tablet 3  . potassium chloride SA (K-DUR,KLOR-CON) 20 MEQ tablet Take 1 tablet (20 mEq total) by mouth 2 (two) times daily. 180 tablet 3  . Probiotic CAPS Take 1 capsule by mouth daily. 90 capsule 3  . promethazine (PHENERGAN) 12.5 MG tablet Take 1 tablet (12.5 mg total) by mouth every 6 (six) hours as needed for nausea or vomiting. 30 tablet 11  . rosuvastatin (CRESTOR) 20 MG tablet Take 1 tablet (20 mg total) by mouth daily. 90 tablet 3  . tiotropium (SPIRIVA HANDIHALER) 18 MCG inhalation capsule Place 1 capsule (18 mcg total) into inhaler and inhale daily. 90 capsule 3  . warfarin (COUMADIN) 5 MG tablet Take 2.5 mg by mouth daily.      Allergies: Allergies as of 01/16/2014 - Review Complete 01/16/2014  Allergen Reaction Noted  . Lorazepam  11/04/2010  . Morphine and related Other (See Comments) 06/27/2013  . Oxycontin [oxycodone] Other (See Comments) 05/03/2012  . Tramadol hcl     Past Medical History  Diagnosis Date  . COPD (chronic obstructive pulmonary disease) with emphysema     PFTs 02/2012: FEV1 0.92 (40%), ratio 69, 27% increase in FEV1 with BD, TLC 91%, severe airtrapping, DLCO49% On chronic home O2. Pulmonary rehab referral 05/2012   . Moderate to severe pulmonary hypertension     2014 TEE w PA peak pressure 46 mmHg, s/p MV replacement   . Tobacco abuse 07/28/2012  . Hyperlipidemia LDL goal < 100 11/20/2005  . Carotid artery stenosis     s/p right endarterectomy (06/2010) Carotid US (07/2010):  Left: Moderate-to-severe (60-79%) calcific and non-calcific plaque origin and proximal ICA and ECA   . Mitral stenosis     s/p Mitral valve replacement with a 27-mm pericardial porcine valve (Medtronic Mosaic valve, serial #[id number] on 09/20/10, Dr. Prescott Gum)   . Chronic congestive heart failure with left ventricular diastolic dysfunction 0/98/1191  . Depression 11/19/2005  . Anxiety 07/24/2010  . Fibromyalgia 08/29/2010  . Obesity, Class II, BMI 35-39.9 10/23/2011  .  Adenomatous polyps 05/14/2011    Colonoscopy (05/2011): 4 mm adenomatous polyp excised endoscopically Colonoscopy (02/2002): Adenomatous polyp excised endoscopically   . Gastroesophageal reflux disease   . Internal hemorrhoids 08/04/2012  . Osteoporosis     DEXA (12/09/2011): L-spine T -3.7, left hip T -1.4 DEXA (12/2004): L-spine T -2.6, left hip -0.1   . Allergic rhinitis 06/01/2012  . Bilateral cataracts 08/04/2012    Visually insignificant   . Chronic constipation 02/03/2011  . Chronic venous insufficiency 08/04/2012  . Paroxysmal atrial fibrillation 10/22/2010    s/p Left atrial maze procedure for paroxysmal atrial fibrillation on 09/20/2010 by Dr Prescott Gum.  Subsequent splenic infarct, decision was made to re-anticoagulate with coumadin, likely life-long as this is the most likely cause of the splenic infarct.   Marland Kitchen  Chronic low back pain 10/06/2012  . Anemia of chronic disease 01/01/2013  . Pneumonia     "twice" (01/07/2013)  . Asthmatic bronchitis     "hx; don't get it anymore" (01/07/2013)  . Exertional shortness of breath   . Obstructive sleep apnea     Nocturnal polysomnography (06/2009): Moderate sleep apnea/ hypopnea syndrome , AHI 17.8 per hour with nonpositional hypopneas. CPAP titration to 12 CWP, AHI 2.4 per hour. On nocturnal CPAP via a small resMed Quattro full-face mask with heated humidifier.   . On home oxygen therapy     "2L q hs; use it in the day when I need it" (01/07/2013)  . Type 2 diabetes mellitus with diabetic neuropathy   . History of blood transfusion     "several times" (01/07/2013)  . H/O hiatal hernia   . Arthritis     "back and hands" (01/07/2013)  . Clear cell renal cell carcinoma 07/21/2011    s/p cryoablation of left RCC in 09/2011 by [name]. Followed by [name]  [facility]) .    Marland Kitchen Chronic daily headache 01/14/2013   Past Surgical History  Procedure Laterality Date  . Orif clavicle fracture  01/2004    by Thana Farr. [name], M.D for Right  clavicle nonunion.  . Tonsillectomy    . Carotid endarterectomy Right 07/04/2010    by [name] for asymptomatic right carotid artery stenosis  . Lipoma excision  08/2005    occipital lipoma 1.5cm - by Dr. [name]  . Hysteroscopy with endometrial ablation  06/2001    for persistent post-menopausal bleeding // by S. [name], M.D.  . Mitral valve replacement  09/20/10     with a 27-mm pericardial porcine valve (Medtronic Mosaic valve, serial #[id number]). 09/20/10, Dr Prescott Gum  . Maze Left 09/20/10    for paroxysmal atrial fibrillation (Dr. Prescott Gum)  . Chest tube insertion  09/24/2010    Dr Prescott Gum  . Cardiac valve replacement  Aug. 2012    "mitral valve"  . Colonoscopy  05/12/2011    performed by Dr. Michail Sermon. Showing small internal hemorrhoids, single tubular adenoma polyp  . Esophagogastroduodenoscopy  05/12/2011    performed by Dr. Michail Sermon. Negative for ulcerations, biopsy negative for evidence of celiac sprue  . Tubal ligation    . Eye surgery    . Fracture surgery Right 2005    clavicle  . Dilation and curettage of uterus    . Cryoablation Left 09/2011    by [name]. Followed by [name]  [facility]) .     Family History  Problem Relation Age of Onset  . Peptic Ulcer Disease Father   . Heart attack Father 79    Died of MI at age 11  . Heart attack Brother 57    Died of MI at age 71  . Obesity Brother   . Pneumonia Mother   . Healthy Sister   . Lupus Daughter   . Obsessive Compulsive Disorder Daughter    History   Social History  . Marital Status: Divorced    Spouse Name: N/A    Number of Children: N/A  . Years of Education: N/A   Occupational History  . Not on file.   Social History Main Topics  . Smoking status: Former Smoker -- 1.50 packs/day for 50 years    Types: Cigarettes    Start date: 10/31/2013  . Smokeless tobacco: Never Used     Comment: 1.5-2pks a day  . Alcohol  Use: No  . Drug Use: No  . Sexual Activity: No   Other  Topics Concern  . Not on file   Social History Narrative   Lives alone in [place] with 60 pound chow   Worked at [employer] for 18 years   No car    Review of Systems: Constitutional: +subjective fevers, no chills Eyes: no vision changes Ears, nose, mouth, throat, and face: no cough Respiratory: no shortness of breath Cardiovascular: +chronic chest pain Gastrointestinal: +nausea, no vomiting, +abdominal pain, no constipation, +diarrhea Genitourinary: no dysuria, no hematuria Integument: +rash around buttocks Hematologic/lymphatic: no bleeding/bruising, no edema Musculoskeletal: no arthralgias, +chronic myalgias Neurological: no paresthesias, no weakness   Physical Exam: Blood pressure 125/63, pulse 80, temperature 98.2 F (36.8 C), temperature source Oral, resp. rate 18, height 5' 2" (1.575 m), weight 203 lb 8 oz (92.307 kg), SpO2 99 %. General Apperance: NAD Head: Normocephalic, atraumatic Eyes: PERRL, EOMI, anicteric sclera Ears: Normal external ear canal Nose: Nares normal, septum midline, mucosa normal Throat: Lips, mucosa and tongue normal  Neck: Supple, trachea midline Back: No tenderness or bony abnormality  Lungs: Clear to auscultation bilaterally. No wheezes, rhonchi or rales. Breathing comfortably on 2L [place] Chest Wall: Nontender, no deformity Heart: Regular rate and rhythm, no murmur/rub/gallop Abdomen: Soft, nontender, nondistended, no rebound/guarding Groin: Erythema and minimal white discharge in intertriginous regions Extremities: Normal, atraumatic, warm and well perfused, no edema Pulses: 2+ throughout Skin: No rashes or lesions Neurologic: Alert and oriented x 3. CNII-XII intact. Normal strength and sensation   Lab results: Basic Metabolic Panel:  Recent Labs  01/16/14 1900  NA 137  K 4.1  CL 98  CO2 25  GLUCOSE 139*  BUN 14  CREATININE 0.99  CALCIUM 9.8  MG 1.9   Liver Function Tests:  Recent Labs   01/16/14 1900  AST 26  ALT 31  ALKPHOS 48  BILITOT 0.3  PROT 6.9  ALBUMIN 3.8   CBC:  Recent Labs  01/16/14 1900  WBC 7.1  HGB 11.3*  HCT 35.0*  MCV 91.1  PLT 199   Cardiac Enzymes:  Recent Labs  01/16/14 1900  TROPONINI <0.30   Coagulation:  Recent Labs  01/16/14 1900  LABPROT 21.4*  INR 1.84*   Assessment & Plan by Problem: Active Problems:   Diarrhea with dehydration   Diarrhea  Diarrhea: History of C diff toxin positive, PCR negative- was not treated at that time. Afebrile. Positive orthostatic hypotension (BP 159/62 supine and 139/61 sitting). No leukocytosis.  -C Diff PCR, stool pathogen panel -NS _0 /hr  Candidal vaginitis:  -Diflucan 147m x 1  PAF on Coumadin -Continue coumadin  Chronic pain: Chest pain chronic and intermittent -EKG -Initial troponin negative -continue home gabapentin 3048mBID -Continue home Vicodin 5/32581m-2 tablets Q6hr prn pain  DM2 -SSI moderate -CBG q AC/HS -Levemir 30u BID  COPD on 2L home O2 at night and prn -Albuterol neb 2.5mg41mhr prn -Flonase 50mc56mspray daily -Continue home Advair as Dulera 100-5 mcg 2 puff BID -Continue home Spiriva 18mcg27mly  HLD: -continue home Crestor 20mg Q71mOSA on CPAP -Continue CPAP at night  Depression/Anxiety -Continue home Xanax 0.5mg to 39m BID 64m -Continue paroxetine 40mg dail71mEN:  -Heart healthy/carb modified  DVT ppx: warfarin  Dispo: Disposition is deferred at this time, awaiting improvement of current medical problems. Anticipated discharge in approximately 1-2 day(s).   The patient does have a current PCP (Lawrence Karren Cobble  does need an [facility] hospital follow-up appointment after discharge.  The patient does not have transportation limitations that hinder transportation to clinic appointments.  Signed: Jacques Earthly, MD 01/16/2014, 8:36 PM

## 2014-01-17 ENCOUNTER — Ambulatory Visit: Payer: Medicare Other | Admitting: Internal Medicine

## 2014-01-17 DIAGNOSIS — R197 Diarrhea, unspecified: Principal | ICD-10-CM

## 2014-01-17 DIAGNOSIS — I480 Paroxysmal atrial fibrillation: Secondary | ICD-10-CM

## 2014-01-17 DIAGNOSIS — Z7951 Long term (current) use of inhaled steroids: Secondary | ICD-10-CM

## 2014-01-17 DIAGNOSIS — M791 Myalgia: Secondary | ICD-10-CM

## 2014-01-17 DIAGNOSIS — B373 Candidiasis of vulva and vagina: Secondary | ICD-10-CM

## 2014-01-17 DIAGNOSIS — F418 Other specified anxiety disorders: Secondary | ICD-10-CM

## 2014-01-17 DIAGNOSIS — Z7901 Long term (current) use of anticoagulants: Secondary | ICD-10-CM

## 2014-01-17 DIAGNOSIS — J449 Chronic obstructive pulmonary disease, unspecified: Secondary | ICD-10-CM

## 2014-01-17 DIAGNOSIS — G4733 Obstructive sleep apnea (adult) (pediatric): Secondary | ICD-10-CM

## 2014-01-17 DIAGNOSIS — E785 Hyperlipidemia, unspecified: Secondary | ICD-10-CM

## 2014-01-17 DIAGNOSIS — Z794 Long term (current) use of insulin: Secondary | ICD-10-CM

## 2014-01-17 DIAGNOSIS — E119 Type 2 diabetes mellitus without complications: Secondary | ICD-10-CM

## 2014-01-17 DIAGNOSIS — G894 Chronic pain syndrome: Secondary | ICD-10-CM

## 2014-01-17 LAB — GI PATHOGEN PANEL BY PCR, STOOL
C difficile toxin A/B: NEGATIVE
Campylobacter by PCR: NEGATIVE
Cryptosporidium by PCR: NEGATIVE
E coli (ETEC) LT/ST: NEGATIVE
E coli (STEC): NEGATIVE
E coli 0157 by PCR: NEGATIVE
G lamblia by PCR: NEGATIVE
Norovirus GI/GII: NEGATIVE
Rotavirus A by PCR: NEGATIVE
Salmonella by PCR: NEGATIVE
Shigella by PCR: NEGATIVE

## 2014-01-17 LAB — GLUCOSE, CAPILLARY
Glucose-Capillary: 210 mg/dL — ABNORMAL HIGH (ref 70–99)
Glucose-Capillary: 247 mg/dL — ABNORMAL HIGH (ref 70–99)
Glucose-Capillary: 309 mg/dL — ABNORMAL HIGH (ref 70–99)
Glucose-Capillary: 191 mg/dL — ABNORMAL HIGH (ref 70–99)

## 2014-01-17 LAB — BASIC METABOLIC PANEL
Anion gap: 17 — ABNORMAL HIGH (ref 5–15)
BUN: 15 mg/dL (ref 6–23)
CO2: 20 mEq/L (ref 19–32)
Calcium: 9.1 mg/dL (ref 8.4–10.5)
Chloride: 99 mEq/L (ref 96–112)
Creatinine, Ser: 0.94 mg/dL (ref 0.50–1.10)
GFR calc Af Amer: 73 mL/min — ABNORMAL LOW (ref >90)
GFR calc non Af Amer: 63 mL/min — ABNORMAL LOW (ref >90)
Glucose, Bld: 216 mg/dL — ABNORMAL HIGH (ref 70–99)
Potassium: 4.1 mEq/L (ref 3.7–5.3)
Sodium: 136 mEq/L — ABNORMAL LOW (ref 137–147)

## 2014-01-17 LAB — CLOSTRIDIUM DIFFICILE BY PCR: Toxigenic C. Difficile by PCR: NEGATIVE

## 2014-01-17 LAB — CBC
HCT: 32.8 % — ABNORMAL LOW (ref 36.0–46.0)
Hemoglobin: 10.8 g/dL — ABNORMAL LOW (ref 12.0–15.0)
MCH: 30.9 pg (ref 26.0–34.0)
MCHC: 32.9 g/dL (ref 30.0–36.0)
MCV: 94 fL (ref 78.0–100.0)
Platelets: 197 10*3/uL (ref 150–400)
RBC: 3.49 MIL/uL — ABNORMAL LOW (ref 3.87–5.11)
RDW: 13.9 % (ref 11.5–15.5)
WBC: 6.3 10*3/uL (ref 4.0–10.5)

## 2014-01-17 LAB — PROTIME-INR
INR: 1.79 — ABNORMAL HIGH (ref 0.00–1.49)
Prothrombin Time: 21 seconds — ABNORMAL HIGH (ref 11.6–15.2)

## 2014-01-17 LAB — TROPONIN I
Troponin I: <0.3 ng/mL (ref <0.30)
Troponin I: <0.3 ng/mL (ref <0.30)

## 2014-01-17 MED ORDER — WARFARIN SODIUM 4 MG PO TABS
4.0000 mg | ORAL_TABLET | Freq: Once | ORAL | Status: AC
  Administered 2014-01-17: 4 mg via ORAL
  Filled 2014-01-17: qty 1

## 2014-01-17 MED ORDER — INSULIN DETEMIR 100 UNIT/ML ~~LOC~~ SOLN
35.0000 [IU] | Freq: Two times a day (BID) | SUBCUTANEOUS | Status: DC
  Administered 2014-01-17 – 2014-01-18 (×2): 35 [IU] via SUBCUTANEOUS
  Filled 2014-01-17 (×3): qty 0.35

## 2014-01-17 NOTE — Progress Notes (Signed)
Subjective: Doing well overall. She reports her stools are soft in consistency and some decrease in frequency. Denies blood in stool. No nausea/vomiting. Mild abdominal pain.  Objective: Vital signs in last 24 hours: Filed Vitals:   01/16/14 1710 01/16/14 2104 01/17/14 0600  BP: 125/63 125/73 119/55  Pulse: 80 78 58  Temp: 98.2 F (36.8 C) 98.2 F (36.8 C) 97.7 F (36.5 C)  TempSrc: Oral Oral Oral  Resp: _0 Height: _1  (1.575 m)    Weight: 203 lb 8 oz (92.307 kg)    SpO2: 99% 96% 98%   Weight change:   Intake/Output Summary (Last 24 hours) at 01/17/14 1200 Last data filed at 01/17/14 0955  Gross per 24 hour  Intake 1708.75 ml  Output      0 ml  Net 1708.75 ml   General Apperance: NAD HEENT: Normocephalic, atraumatic, PERRL, EOMI, anicteric sclera Lungs: Clear to auscultation bilaterally. No wheezes, rhonchi or rales. Breathing comfortably on 2L [place] Heart: Regular rate and rhythm, no murmur/rub/gallop Abdomen: Soft, nontender, nondistended, no rebound/guarding Extremities: Normal, atraumatic, warm and well perfused, no edema Pulses: 2+ throughout Skin: No rashes or lesions Neurologic: Alert and oriented x 3. CNII-XII intact. Normal strength and sensation  Lab Results: Basic Metabolic Panel:  Recent Labs Lab 01/16/14 1900 01/17/14 0545  NA 137 136*  K 4.1 4.1  CL 98 99  CO2 25 20  GLUCOSE 139* 216*  BUN 14 15  CREATININE 0.99 0.94  CALCIUM 9.8 9.1  MG 1.9  --    Liver Function Tests:  Recent Labs Lab 01/16/14 1900  AST 26  ALT 31  ALKPHOS 48  BILITOT 0.3  PROT 6.9  ALBUMIN 3.8   CBC:  Recent Labs Lab 01/16/14 1900 01/17/14 0500  WBC 7.1 6.3  HGB 11.3* 10.8*  HCT 35.0* 32.8*  MCV 91.1 94.0  PLT 199 197   Cardiac Enzymes:  Recent Labs Lab 01/16/14 1900 01/16/14 2359 01/17/14 0545  TROPONINI <0.30 <0.30 <0.30   CBG:  Recent Labs Lab 01/16/14 2143 01/17/14 0737  GLUCAP 220* 210*   Coagulation:  Recent Labs Lab  01/16/14 1900 01/17/14 0500  LABPROT 21.4* 21.0*  INR 1.84* 1.79*   Urine Drug Screen: Drugs of Abuse     Component Value Date/Time   LABOPIA POSITIVE* 01/07/2013 2114   COCAINSCRNUR NONE DETECTED 01/07/2013 2114   LABBENZ NONE DETECTED 01/07/2013 2114   AMPHETMU NONE DETECTED 01/07/2013 2114   THCU NONE DETECTED 01/07/2013 2114   LABBARB NONE DETECTED 01/07/2013 2114    Urinalysis:  Recent Labs Lab 01/16/14 2100  COLORURINE YELLOW  LABSPEC 1.009  PHURINE 5.5  GLUCOSEU NEGATIVE  HGBUR TRACE*  BILIRUBINUR NEGATIVE  KETONESUR NEGATIVE  PROTEINUR NEGATIVE  UROBILINOGEN 0.2  NITRITE NEGATIVE  LEUKOCYTESUR TRACE*    Micro Results: Recent Results (from the past 240 hour(s))  Clostridium Difficile by PCR     Status: None   Collection Time: 01/16/14  6:22 PM  Result Value Ref Range Status   C difficile by pcr NEGATIVE NEGATIVE Final   Studies/Results: No results found. Medications: I have reviewed the patient's current medications. Scheduled Meds: . fluticasone  2 spray Each Nare Daily  . gabapentin  300 mg Oral BID  . insulin aspart  0-15 Units Subcutaneous TID WC  . insulin detemir  30 Units Subcutaneous BID  . mometasone-formoterol  2 puff Inhalation BID  . nicotine  14 mg Transdermal Daily  . PARoxetine  40 mg Oral Daily  .  potassium chloride SA  20 mEq Oral BID  . rosuvastatin  20 mg Oral q1800  . sodium chloride  3 mL Intravenous Q12H  . tiotropium  18 mcg Inhalation Daily  . Warfarin - Pharmacist Dosing Inpatient   Does not apply q1800   Continuous Infusions: . sodium chloride 75 mL/hr at 01/17/14 0722   PRN Meds:.acetaminophen **OR** acetaminophen, albuterol, ALPRAZolam, HYDROcodone-acetaminophen Assessment/Plan: Active Problems:   Type 2 diabetes mellitus with diabetic neuropathy   Pulmonary emphysema   Fibromyalgia   Paroxysmal atrial fibrillation   Vulvovaginal candidiasis   Diarrhea with dehydration  Diarrhea: C diff PCR negative. Improving  clinically. -stool pathogen panel pending -NS _0 /hr  Candidal vaginitis:  -Diflucan 143m x 1 on 12/7. Next dose in 72 hours.  PAF on Coumadin -Continue coumadin  Chronic pain: Chest pain chronic and intermittent. Troponins negative x 3. -continue home gabapentin 3051mBID -Continue home Vicodin 5/32562m-2 tablets Q6hr prn pain  DM2: BG in 200s -SSI moderate -CBG q AC/HS -Increase Levemir to 35u BID  COPD on 2L home O2 at night and prn -Albuterol neb 2.5mg62mhr prn -Flonase 50mc69mspray daily -Continue home Advair as Dulera 100-5 mcg 2 puff BID -Continue home Spiriva 18mcg69mly  HLD: -continue home Crestor 20mg Q25mOSA on CPAP -Continue CPAP at night  Depression/Anxiety -Continue home Xanax 0.5mg to 52m BID 32m -Continue paroxetine 40mg dail36mEN:  -Heart healthy/carb modified  DVT ppx: warfarin  Dispo: Disposition is deferred at this time, awaiting improvement of current medical problems.  Anticipated discharge in approximately 1 day(s).   The patient does have a current PCP ([facility].  The patient does not have transportation limitations that hinder transportation to clinic appointments.  .Services Needed at time of discharge: Y = Yes, Blank = No PT:   OT:   RN:   Equipment:   Other:     LOS: 1 day   Jennifer KJacques Earthly2015, 12:00 PM

## 2014-01-17 NOTE — Progress Notes (Signed)
ANTICOAGULATION CONSULT NOTE - Follow Up Consult  Pharmacy Consult for Coumadin Indication: atrial fibrillation and hx splenic infarct  Allergies  Allergen Reactions  . Lorazepam     Patient's sister noted that ativan caused the patient to become extremely confused during hospitalization 09/2010  . Morphine And Related Other (See Comments)  . Oxycontin [Oxycodone] Other (See Comments)    headache  . Tramadol Hcl     REACTION: swelling    Patient Measurements: Height: 5' 2" (157.5 cm) Weight: 203 lb 8 oz (92.307 kg) IBW/kg (Calculated) : 50.1  Vital Signs: Temp: 97.4 F (36.3 C) (12/08 1326) Temp Source: Oral (12/08 1326) BP: 94/74 mmHg (12/08 1326) Pulse Rate: 72 (12/08 1326)  Labs:  Recent Labs  01/16/14 1900 01/16/14 2359 01/17/14 0500 01/17/14 0545  HGB 11.3*  --  10.8*  --   HCT 35.0*  --  32.8*  --   PLT 199  --  197  --   APTT 35  --   --   --   LABPROT 21.4*  --  21.0*  --   INR 1.84*  --  1.79*  --   CREATININE 0.99  --   --  0.94  TROPONINI <0.30 <0.30  --  <0.30    Estimated Creatinine Clearance: 64 mL/min (by C-G formula based on Cr of 0.94).  Assessment:   INR is subtherapeutic at 1.79.  Was 1.84 on admit last night, and home Coumadin dose of 2.5 mg was given.  She has been on 2.5 mg daily for some time, and her outpatient INRs have all been therapeutic.   Goal of Therapy:  INR 2-3 Monitor platelets by anticoagulation protocol: Yes   Plan:   Coumadin 4 mg x 1 today.  Daily PT/INR.  Hope to resume home Coumadin regimen of 2.5 mg daily on 01/18/14.  Arty Baumgartner, [place] Pager: [phone] 01/17/2014,1:28 PM

## 2014-01-17 NOTE — Progress Notes (Signed)
UR completed.

## 2014-01-17 NOTE — Progress Notes (Signed)
Pt places self on/off home cpap.

## 2014-01-18 DIAGNOSIS — R197 Diarrhea, unspecified: Secondary | ICD-10-CM | POA: Diagnosis not present

## 2014-01-18 LAB — BASIC METABOLIC PANEL
Anion gap: 13 (ref 5–15)
BUN: 15 mg/dL (ref 6–23)
CO2: 21 mEq/L (ref 19–32)
Calcium: 8.6 mg/dL (ref 8.4–10.5)
Chloride: 103 mEq/L (ref 96–112)
Creatinine, Ser: 0.88 mg/dL (ref 0.50–1.10)
GFR calc Af Amer: 79 mL/min — ABNORMAL LOW (ref >90)
GFR calc non Af Amer: 68 mL/min — ABNORMAL LOW (ref >90)
Glucose, Bld: 239 mg/dL — ABNORMAL HIGH (ref 70–99)
Potassium: 4.4 mEq/L (ref 3.7–5.3)
Sodium: 137 mEq/L (ref 137–147)

## 2014-01-18 LAB — GLUCOSE, CAPILLARY
Glucose-Capillary: 209 mg/dL — ABNORMAL HIGH (ref 70–99)
Glucose-Capillary: 188 mg/dL — ABNORMAL HIGH (ref 70–99)

## 2014-01-18 LAB — PROTIME-INR
INR: 2.11 — ABNORMAL HIGH (ref 0.00–1.49)
Prothrombin Time: 23.9 seconds — ABNORMAL HIGH (ref 11.6–15.2)

## 2014-01-18 LAB — CBC
HCT: 31.7 % — ABNORMAL LOW (ref 36.0–46.0)
Hemoglobin: 10.2 g/dL — ABNORMAL LOW (ref 12.0–15.0)
MCH: 30 pg (ref 26.0–34.0)
MCHC: 32.2 g/dL (ref 30.0–36.0)
MCV: 93.2 fL (ref 78.0–100.0)
Platelets: 177 10*3/uL (ref 150–400)
RBC: 3.4 MIL/uL — ABNORMAL LOW (ref 3.87–5.11)
RDW: 13.9 % (ref 11.5–15.5)
WBC: 5.9 10*3/uL (ref 4.0–10.5)

## 2014-01-18 MED ORDER — FLUCONAZOLE 150 MG PO TABS: 150.0000 mg | ORAL_TABLET | ORAL | Status: DC

## 2014-01-18 NOTE — Progress Notes (Signed)
Harlon Flor Augsburger to be D/C'd Home per MD order.  Discussed with the patient and all questions fully answered.    Medication List    STOP taking these medications        docusate sodium 50 MG capsule  Commonly known as:  COLACE      TAKE these medications        ACCU-CHEK FASTCLIX LANCET Kit  Check your blood 4 times a day dx code 250.00 insulin requiring     albuterol 108 (90 BASE) MCG/ACT inhaler  Commonly known as:  PROAIR HFA  Inhale 2 puffs into the lungs every 6 (six) hours as needed for wheezing or shortness of breath.     ALPRAZolam 1 MG tablet  Commonly known as:  XANAX  Take 0.5-1 tablets (0.5-1 mg total) by mouth 2 (two) times daily as needed for anxiety.     Calcium Carb-Cholecalciferol 600-800 MG-UNIT Tabs  Take 1 tablet by mouth 2 (two) times daily.     chlorpheniramine 4 MG tablet  Commonly known as:  CHLOR-TRIMETON  Take 1 tablet (4 mg total) by mouth 3 (three) times daily as needed for allergies.     EASY TOUCH PEN NEEDLES 31G X 5 MM Misc  Generic drug:  Insulin Pen Needle  USE TO INJECT INSULIN 5 TIMES DAILY     ferrous gluconate 324 MG tablet  Commonly known as:  FERGON  Take 1 tablet (324 mg total) by mouth 2 (two) times daily with a meal.     fluconazole 150 MG tablet  Commonly known as:  DIFLUCAN  Take 1 tablet (150 mg total) by mouth every 3 (three) days. Take your first dose on Thursday 01/19/2014.  Start taking on:  01/19/2014     fluticasone 50 MCG/ACT nasal spray  Commonly known as:  FLONASE  Place 2 sprays into both nostrils daily.     Fluticasone-Salmeterol 250-50 MCG/DOSE Aepb  Commonly known as:  ADVAIR DISKUS  Inhale 1 puff into the lungs 2 (two) times daily.     furosemide 80 MG tablet  Commonly known as:  LASIX  Take 1 tablet (80 mg total) by mouth 2 (two) times daily.     gabapentin 300 MG capsule  Commonly known as:  NEURONTIN  Take 1 capsule (300 mg total) by mouth 2 (two) times daily.     HYDROcodone-acetaminophen 5-325  MG per tablet  Commonly known as:  NORCO/VICODIN  Take 1-2 tablets by mouth every 6 (six) hours as needed for severe pain.     Insulin Detemir 100 UNIT/ML Pen  Commonly known as:  LEVEMIR  Inject 42 Units into the skin 2 (two) times daily.     insulin lispro 100 UNIT/ML KiwkPen  Commonly known as:  HUMALOG  Inject 0.18 mLs (18 Units total) into the skin every morning.     nystatin cream  Commonly known as:  MYCOSTATIN  Apply 1 application topically 2 (two) times daily.     omeprazole 40 MG capsule  Commonly known as:  PRILOSEC  Take 1 capsule (40 mg total) by mouth daily.     PARoxetine 40 MG tablet  Commonly known as:  PAXIL  Take 1 tablet (40 mg total) by mouth every morning.     potassium chloride SA 20 MEQ tablet  Commonly known as:  K-DUR,KLOR-CON  Take 1 tablet (20 mEq total) by mouth 2 (two) times daily.     Probiotic Caps  Take 1 capsule by mouth daily.  promethazine 12.5 MG tablet  Commonly known as:  PHENERGAN  Take 1 tablet (12.5 mg total) by mouth every 6 (six) hours as needed for nausea or vomiting.     rosuvastatin 20 MG tablet  Commonly known as:  CRESTOR  Take 1 tablet (20 mg total) by mouth daily.     tiotropium 18 MCG inhalation capsule  Commonly known as:  SPIRIVA HANDIHALER  Place 1 capsule (18 mcg total) into inhaler and inhale daily.     warfarin 5 MG tablet  Commonly known as:  COUMADIN  Take 2.5 mg by mouth daily.        VVS, Skin clean, dry and intact without evidence of skin break down, no evidence of skin tears noted. IV catheter discontinued intact. Site without signs and symptoms of complications. Dressing and pressure applied.  An After Visit Summary was printed and given to the patient.  D/c education completed with patient/family including follow up instructions, medication list, d/c activities limitations if indicated, with other d/c instructions as indicated by MD - patient able to verbalize understanding, all questions fully  answered.   Patient instructed to return to ED, call 911, or call MD for any changes in condition.   Patient escorted via [place], and D/C home via private auto.  Audria Nine F 01/18/2014 3:36 PM

## 2014-01-18 NOTE — Progress Notes (Signed)
Subjective: Doing well overall. Reports her last stool was yesterday morning (soft). Denies nausea/vomiting, abdominal pain.   Objective: Vital signs in last 24 hours: Filed Vitals:   01/18/14 0331 01/18/14 0542 01/18/14 0557 01/18/14 0917  BP:  146/56    Pulse: 72 72    Temp:  97.6 F (36.4 C)    TempSrc:  Oral    Resp: 18 19    Height:      Weight:   207 lb 9.6 oz (94.167 kg)   SpO2:  97%  98%   Weight change: 4 lb 1.6 oz (1.86 kg)  Intake/Output Summary (Last 24 hours) at 01/18/14 1146 Last data filed at 01/18/14 1126  Gross per 24 hour  Intake   3185 ml  Output      0 ml  Net   3185 ml   General Apperance: NAD HEENT: Normocephalic, atraumatic, PERRL, EOMI, anicteric sclera Lungs: Clear to auscultation bilaterally. No wheezes, rhonchi or rales. Breathing comfortably on 2L [place] Heart: Regular rate and rhythm, no murmur/rub/gallop Abdomen: Soft, nontender, nondistended, no rebound/guarding Extremities: Normal, atraumatic, warm and well perfused, no edema Pulses: 2+ throughout Skin: No rashes or lesions Neurologic: Alert and oriented x 3. CNII-XII intact. Normal strength and sensation  Lab Results: Basic Metabolic Panel:  Recent Labs Lab 01/16/14 1900 01/17/14 0545 01/18/14 0520  NA 137 136* 137  K 4.1 4.1 4.4  CL 98 99 103  CO2 _0 GLUCOSE 139* 216* 239*  BUN _1 CREATININE 0.99 0.94 0.88  CALCIUM 9.8 9.1 8.6  MG 1.9  --   --    Liver Function Tests:  Recent Labs Lab 01/16/14 1900  AST 26  ALT 31  ALKPHOS 48  BILITOT 0.3  PROT 6.9  ALBUMIN 3.8   CBC:  Recent Labs Lab 01/17/14 0500 01/18/14 0520  WBC 6.3 5.9  HGB 10.8* 10.2*  HCT 32.8* 31.7*  MCV 94.0 93.2  PLT 197 177   Cardiac Enzymes:  Recent Labs Lab 01/16/14 1900 01/16/14 2359 01/17/14 0545  TROPONINI <0.30 <0.30 <0.30   CBG:  Recent Labs Lab 01/16/14 2143 01/17/14 0737 01/17/14 1211 01/17/14 1652 01/17/14 2044 01/18/14 0808  GLUCAP 220* 210* 247* 309*  191* 209*   Coagulation:  Recent Labs Lab 01/16/14 1900 01/17/14 0500 01/18/14 0520  LABPROT 21.4* 21.0* 23.9*  INR 1.84* 1.79* 2.11*   Urine Drug Screen: Drugs of Abuse     Component Value Date/Time   LABOPIA POSITIVE* 01/07/2013 2114   COCAINSCRNUR NONE DETECTED 01/07/2013 2114   LABBENZ NONE DETECTED 01/07/2013 2114   AMPHETMU NONE DETECTED 01/07/2013 2114   THCU NONE DETECTED 01/07/2013 2114   LABBARB NONE DETECTED 01/07/2013 2114    Urinalysis:  Recent Labs Lab 01/16/14 2100  COLORURINE YELLOW  LABSPEC 1.009  PHURINE 5.5  GLUCOSEU NEGATIVE  HGBUR TRACE*  BILIRUBINUR NEGATIVE  KETONESUR NEGATIVE  PROTEINUR NEGATIVE  UROBILINOGEN 0.2  NITRITE NEGATIVE  LEUKOCYTESUR TRACE*    Micro Results: Recent Results (from the past 240 hour(s))  Clostridium Difficile by PCR     Status: None   Collection Time: 01/16/14  6:22 PM  Result Value Ref Range Status   C difficile by pcr NEGATIVE NEGATIVE Final   Studies/Results: No results found. Medications: I have reviewed the patient's current medications. Scheduled Meds: . fluticasone  2 spray Each Nare Daily  . gabapentin  300 mg Oral BID  . insulin aspart  0-15 Units Subcutaneous TID WC  . insulin detemir  35 Units Subcutaneous BID  . mometasone-formoterol  2 puff Inhalation BID  . nicotine  14 mg Transdermal Daily  . PARoxetine  40 mg Oral Daily  . potassium chloride SA  20 mEq Oral BID  . rosuvastatin  20 mg Oral q1800  . sodium chloride  3 mL Intravenous Q12H  . tiotropium  18 mcg Inhalation Daily  . Warfarin - Pharmacist Dosing Inpatient   Does not apply q1800   Continuous Infusions: . sodium chloride 75 mL/hr at 01/18/14 0820   PRN Meds:.acetaminophen **OR** acetaminophen, albuterol, ALPRAZolam, HYDROcodone-acetaminophen Assessment/Plan: Active Problems:   Type 2 diabetes mellitus with diabetic neuropathy   Pulmonary emphysema   Fibromyalgia   Paroxysmal atrial fibrillation   Vulvovaginal  candidiasis   Diarrhea with dehydration  Diarrhea: C diff PCR and stool pathogen panel negative. Improving clinically. -D/c IV fluids  Candidal vaginitis:  -Diflucan 143m x 1 on 12/7. Next dose 12/10. Will do total 3 doses Q 72hr.  PAF on Coumadin -Continue coumadin  Chronic pain:  -continue home gabapentin 3041mBID -Continue home Vicodin 5/32559m-2 tablets Q6hr prn pain  DM2: BG in 200s -SSI moderate -CBG q AC/HS -Levemir 35u BID, will discharge on home dose  COPD on 2L home O2 at night and prn -Albuterol neb 2.5mg58mhr prn -Flonase 50mc71mspray daily -Continue home Advair as Dulera 100-5 mcg 2 puff BID -Continue home Spiriva 18mcg98mly  HLD: -continue home Crestor 20mg Q5mOSA on CPAP -Continue CPAP at night  Depression/Anxiety -Continue home Xanax 0.5mg to 44m BID 24m -Continue paroxetine 40mg dail16mEN:  -Heart healthy/carb modified  DVT ppx: warfarin  Dispo: Likely home today  The patient does have a current PCP ([facility].  The patient does not have transportation limitations that hinder transportation to clinic appointments.  .Services Needed at time of discharge: Y = Yes, Blank = No PT:   OT:   RN:   Equipment:   Other:     LOS: 2 days   Toneisha Savary KJacques Earthly2015, 11:46 AM

## 2014-01-18 NOTE — Discharge Instructions (Signed)
If you develop loose or watery stools (diarrhea), stop taking your Lasix and call the clinic.

## 2014-01-18 NOTE — Care Management Note (Signed)
Page 1 of 1   01/18/2014     5:35:40 PM CARE MANAGEMENT NOTE 01/18/2014  Patient:  [name], [name]   Account Number:  [id number]  Date Initiated:  01/18/2014  Documentation initiated by:  Tomi Bamberger  Subjective/Objective Assessment:   admit as observaiton- from home     Action/Plan:   Anticipated DC Date:  01/18/2014   Anticipated DC Plan:  [place]  CM consult      Choice offered to / List presented to:             Status of service:  Completed, signed off Medicare Important Message given?  NO (If response is "NO", the following Medicare IM given date fields will be blank) Date Medicare IM given:   Medicare IM given by:   Date Additional Medicare IM given:   Additional Medicare IM given by:    Discharge Disposition:  HOME/SELF CARE  Per UR Regulation:  Reviewed for med. necessity/level of care/duration of stay  If discussed at [place] of Stay Meetings, dates discussed:    Comments:  01/18/14 [place], BSN [phone]  no needs anticipated.

## 2014-01-18 NOTE — Plan of Care (Signed)
Problem: Phase I Progression Outcomes Goal: Pain controlled with appropriate interventions Outcome: Completed/Met Date Met:  01/18/14 Goal: OOB as tolerated unless otherwise ordered Outcome: Completed/Met Date Met:  01/18/14 Goal: Initial discharge plan identified Outcome: Completed/Met Date Met:  01/18/14 Goal: Voiding-avoid urinary catheter unless indicated Outcome: Completed/Met Date Met:  01/18/14 Goal: Hemodynamically stable Outcome: Completed/Met Date Met:  01/18/14  Problem: Phase II Progression Outcomes Goal: Progress activity as tolerated unless otherwise ordered Outcome: Completed/Met Date Met:  01/18/14 Goal: Discharge plan established Outcome: Completed/Met Date Met:  01/18/14 Goal: Vital signs remain stable Outcome: Completed/Met Date Met:  01/18/14 Goal: Obtain order to discontinue catheter if appropriate Outcome: Not Applicable Date Met:  05/26/91

## 2014-01-18 NOTE — Discharge Summary (Signed)
Name: [name] MRN: [id number] DOB: Jan 01, 1951 63 y.o. PCP: Karren Cobble, MD  Date of Admission: 01/16/2014  4:59 PM Date of Discharge: 01/18/2014 Attending Physician: [name], MD  Discharge Diagnosis: Active Problems:   Type 2 diabetes mellitus with diabetic neuropathy   Pulmonary emphysema   Fibromyalgia   Paroxysmal atrial fibrillation   Vulvovaginal candidiasis   Diarrhea with dehydration  Discharge Medications:   Medication List    STOP taking these medications        docusate sodium 50 MG capsule  Commonly known as:  COLACE      TAKE these medications        ACCU-CHEK FASTCLIX LANCET Kit  Check your blood 4 times a day dx code 250.00 insulin requiring     albuterol 108 (90 BASE) MCG/ACT inhaler  Commonly known as:  PROAIR HFA  Inhale 2 puffs into the lungs every 6 (six) hours as needed for wheezing or shortness of breath.     ALPRAZolam 1 MG tablet  Commonly known as:  XANAX  Take 0.5-1 tablets (0.5-1 mg total) by mouth 2 (two) times daily as needed for anxiety.     Calcium Carb-Cholecalciferol 600-800 MG-UNIT Tabs  Take 1 tablet by mouth 2 (two) times daily.     chlorpheniramine 4 MG tablet  Commonly known as:  CHLOR-TRIMETON  Take 1 tablet (4 mg total) by mouth 3 (three) times daily as needed for allergies.     EASY TOUCH PEN NEEDLES 31G X 5 MM Misc  Generic drug:  Insulin Pen Needle  USE TO INJECT INSULIN 5 TIMES DAILY     ferrous gluconate 324 MG tablet  Commonly known as:  FERGON  Take 1 tablet (324 mg total) by mouth 2 (two) times daily with a meal.     fluconazole 150 MG tablet  Commonly known as:  DIFLUCAN  Take 1 tablet (150 mg total) by mouth every 3 (three) days. Take your first dose on Thursday 01/19/2014.  Start taking on:  01/19/2014     fluticasone 50 MCG/ACT nasal spray  Commonly known as:  FLONASE  Place 2 sprays into both nostrils daily.     Fluticasone-Salmeterol 250-50 MCG/DOSE Aepb  Commonly known as:   ADVAIR DISKUS  Inhale 1 puff into the lungs 2 (two) times daily.     furosemide 80 MG tablet  Commonly known as:  LASIX  Take 1 tablet (80 mg total) by mouth 2 (two) times daily.     gabapentin 300 MG capsule  Commonly known as:  NEURONTIN  Take 1 capsule (300 mg total) by mouth 2 (two) times daily.     HYDROcodone-acetaminophen 5-325 MG per tablet  Commonly known as:  NORCO/VICODIN  Take 1-2 tablets by mouth every 6 (six) hours as needed for severe pain.     Insulin Detemir 100 UNIT/ML Pen  Commonly known as:  LEVEMIR  Inject 42 Units into the skin 2 (two) times daily.     insulin lispro 100 UNIT/ML KiwkPen  Commonly known as:  HUMALOG  Inject 0.18 mLs (18 Units total) into the skin every morning.     nystatin cream  Commonly known as:  MYCOSTATIN  Apply 1 application topically 2 (two) times daily.     omeprazole 40 MG capsule  Commonly known as:  PRILOSEC  Take 1 capsule (40 mg total) by mouth daily.     PARoxetine 40 MG tablet  Commonly known as:  PAXIL  Take 1 tablet (40 mg  total) by mouth every morning.     potassium chloride SA 20 MEQ tablet  Commonly known as:  K-DUR,KLOR-CON  Take 1 tablet (20 mEq total) by mouth 2 (two) times daily.     Probiotic Caps  Take 1 capsule by mouth daily.     promethazine 12.5 MG tablet  Commonly known as:  PHENERGAN  Take 1 tablet (12.5 mg total) by mouth every 6 (six) hours as needed for nausea or vomiting.     rosuvastatin 20 MG tablet  Commonly known as:  CRESTOR  Take 1 tablet (20 mg total) by mouth daily.     tiotropium 18 MCG inhalation capsule  Commonly known as:  SPIRIVA HANDIHALER  Place 1 capsule (18 mcg total) into inhaler and inhale daily.     warfarin 5 MG tablet  Commonly known as:  COUMADIN  Take 2.5 mg by mouth daily.        Disposition and follow-up:   [name] was discharged from [facility] in Stable condition.  At the hospital follow up visit please address:  1.   Diarrhea: She was instructed to stop her home bowel regimen. May need to eventually restart this as she is on chronic pain meds.  2.  Labs / imaging needed at time of follow-up: None  3.  Pending labs/ test needing follow-up: None  Follow-up Appointments:     Follow-up Information    Follow up with Otho Bellows, MD On 01/25/2014.   Specialty:  Internal Medicine   Why:  3:45PM for hospital follow up   Contact information:   [address] [place] [address] [phone]       Discharge Instructions: Discharge Instructions    Call MD for:  difficulty breathing, headache or visual disturbances    Complete by:  As directed      Call MD for:  persistant dizziness or light-headedness    Complete by:  As directed      Call MD for:  persistant nausea and vomiting    Complete by:  As directed      Call MD for:  severe uncontrolled pain    Complete by:  As directed      Call MD for:  temperature >100.4    Complete by:  As directed      Diet - low sodium heart healthy    Complete by:  As directed      Increase activity slowly    Complete by:  As directed            Consultations: None  Procedures Performed:  Dg Chest 2 View  12/20/2013   CLINICAL DATA:  Chest pain x1 day.  EXAM: CHEST  2 VIEW  COMPARISON:  12/30/2012.  FINDINGS: Mediastinum and hilar structures are normal. Prior CABG. Heart size stable. Mild pulmonary vascular prominence and interstitial prominence noted. Very mild congestive heart failure cannot be excluded. No pleural effusion or pneumothorax. Prior plate and screw fixation of right clavicular fracture. Diffuse thoracic spine osteopenia degenerative change.  IMPRESSION: 1. Prior CABG. Mild pulmonary vascular prominence and interstitial prominence noted. Very mild component of congestive heart failure cannot be excluded. No or pulmonary alveolar edema. No significant pleural effusion. 2. Prior right clavicular fracture plate and screw fixation.    Electronically Signed   By: Marcello Moores  Register   On: 12/20/2013 07:46    Admission HPI: [name] is a 63 year old woman with history DM2, COPD on 2L home  O2 at night and prn, OSA on CPAP, pulmonary HTN, PAF on Coumadin, CHF, GERD, and h/o C.diff, depression, anxiety presents with diarrhea. Reports the diarrhea started 5 days ago. It was initially watery and now is loose. She has had similar diarrhea 12/03/2012 and was at that time positive for C diff toxin. By PCR she was negative. No food identified as inciting. No recent antibiotics. No sick contacts. Lives by herself at home. No recent hospitalizations. No frequent visits to nursing homes or similar facilities. Last EGD/colonoscopy April 2013 with tubular adenoma, small internal hemorrhoids  She has had subjective fevers, no chills. Some nausea and abdominal pain. Abdominal pain in the lower quadrants that comes and goes, cramping in nature. She denies cough, shortness of breath, changes in chronic intermittent chest pain, vomiting, hematochezia, melena, dysuria, hematuria, vaginal discharge. Reports yeast infection around buttocks.   Hospital Course by problem list: Active Problems:   Type 2 diabetes mellitus with diabetic neuropathy   Pulmonary emphysema   Fibromyalgia   Paroxysmal atrial fibrillation   Vulvovaginal candidiasis   Diarrhea with dehydration   1. Diarrhea with dehydration: History of C diff toxin positive, PCR negative- was not treated at that time. Afebrile. Positive orthostatic hypotension (BP 159/62 supine and 139/61 sitting). No leukocytosis. She was admitted and placed on IV fluids, NS @ 40m/hr. C. diff PCR and stool pathogen panel returned negative. At discharge she was having soft but formed stools and was maintaining PO intake without nausea or vomiting.   2. Vulvovaginal Candidiasis: She had erythema at her labial folds with white discharge. She was given Diflucan 1516mPO Q72 hr for 3 doses.  3. PAF on  Coumadin: She was continued on her coumadin during her hospitalization.  4. Fibromyalgia: She reported chest pain that was chronic and intermittent. EKG was unremarkable and troponins were negative. She was continued on her home gabapentin 30069mID and home Vicodin 5/325m66m2 tablets Q6hr prn pain.  2. DM2: Her home medications were held during hospitalization. She was placed on SSI moderate, CBG q AC/HS, Levemir 30u BID. Upon discharge, she was restarted on her home medications.   2. COPD on 2L home O2 at night and prn: She remained stable in her respiratory status. She was continued on Albuterol neb 2.5mg 75mr prn, Flonase 50mcg23mpray daily, home Advair as Dulera 100-5 mcg 2 puff BID, home Spiriva 18mcg 74my.  Discharge Vitals:   BP 146/56 mmHg  Pulse 72  Temp(Src) 97.6 F (36.4 C) (Oral)  Resp 19  Ht _0  (1.575 m)  Wt 207 lb 9.6 oz (94.167 kg)  BMI 37.96 kg/m2  SpO2 98%  Discharge Labs:  Results for orders placed or performed during the hospital encounter of 01/16/14 (from the past 24 hour(s))  Glucose, capillary     Status: Abnormal   Collection Time: 01/17/14 12:11 PM  Result Value Ref Range   Glucose-Capillary 247 (H) 70 - 99 mg/dL  Glucose, capillary     Status: Abnormal   Collection Time: 01/17/14  4:52 PM  Result Value Ref Range   Glucose-Capillary 309 (H) 70 - 99 mg/dL  Glucose, capillary     Status: Abnormal   Collection Time: 01/17/14  8:44 PM  Result Value Ref Range   Glucose-Capillary 191 (H) 70 - 99 mg/dL  Protime-INR     Status: Abnormal   Collection Time: 01/18/14  5:20 AM  Result Value Ref Range   Prothrombin Time 23.9 (H) 11.6 - 15.2 seconds   INR  2.11 (H) 0.00 - 1.49  CBC     Status: Abnormal   Collection Time: 01/18/14  5:20 AM  Result Value Ref Range   WBC 5.9 4.0 - 10.5 K/uL   RBC 3.40 (L) 3.87 - 5.11 MIL/uL   Hemoglobin 10.2 (L) 12.0 - 15.0 g/dL   HCT 31.7 (L) 36.0 - 46.0 %   MCV 93.2 78.0 - 100.0 fL   MCH 30.0 26.0 - 34.0 pg   MCHC 32.2  30.0 - 36.0 g/dL   RDW 13.9 11.5 - 15.5 %   Platelets 177 150 - 400 K/uL  Basic metabolic panel     Status: Abnormal   Collection Time: 01/18/14  5:20 AM  Result Value Ref Range   Sodium 137 137 - 147 mEq/L   Potassium 4.4 3.7 - 5.3 mEq/L   Chloride 103 96 - 112 mEq/L   CO2 21 19 - 32 mEq/L   Glucose, Bld 239 (H) 70 - 99 mg/dL   BUN 15 6 - 23 mg/dL   Creatinine, Ser 0.88 0.50 - 1.10 mg/dL   Calcium 8.6 8.4 - 10.5 mg/dL   GFR calc non Af Amer 68 (L) >90 mL/min   GFR calc Af Amer 79 (L) >90 mL/min   Anion gap 13 5 - 15  Glucose, capillary     Status: Abnormal   Collection Time: 01/18/14  8:08 AM  Result Value Ref Range   Glucose-Capillary 209 (H) 70 - 99 mg/dL   Comment 1 Documented in Chart    Comment 2 Notify RN    *Note: Due to a large number of results and/or encounters for the requested time period, some results have not been displayed. A complete set of results can be found in Results Review.    Signed: Jacques Earthly, MD 01/18/2014, 11:52 AM    Services Ordered on Discharge: None Equipment Ordered on Discharge: None

## 2014-01-18 NOTE — Plan of Care (Signed)
Problem: Phase I Progression Outcomes Goal: Other Phase I Outcomes/Goals Outcome: Not Applicable Date Met:  76/73/41  Problem: Phase II Progression Outcomes Goal: Other Phase II Outcomes/Goals Outcome: Not Applicable Date Met:  93/79/02  Problem: Phase III Progression Outcomes Goal: Pain controlled on oral analgesia Outcome: Completed/Met Date Met:  01/18/14 Goal: Activity at appropriate level-compared to baseline (UP IN CHAIR FOR HEMODIALYSIS)  Outcome: Completed/Met Date Met:  01/18/14 Goal: Voiding independently Outcome: Completed/Met Date Met:  01/18/14 Goal: Foley discontinued Outcome: Not Applicable Date Met:  40/97/35 Goal: Discharge plan remains appropriate-arrangements made Outcome: Completed/Met Date Met:  01/18/14 Goal: Other Phase III Outcomes/Goals Outcome: Not Applicable Date Met:  32/99/24  Problem: Discharge Progression Outcomes Goal: Tolerating diet Outcome: Completed/Met Date Met:  01/18/14

## 2014-01-18 NOTE — Plan of Care (Signed)
Problem: Phase II Progression Outcomes Goal: IV changed to normal saline lock Outcome: Completed/Met Date Met:  01/18/14  Problem: Phase III Progression Outcomes Goal: IV/normal saline lock discontinued Outcome: Completed/Met Date Met:  01/18/14

## 2014-01-21 NOTE — Progress Notes (Signed)
I saw and evaluated the patient.  I personally confirmed the key portions of Dr. Roda Shutters history and exam and reviewed pertinent patient test results.  The assessment, diagnosis, and plan were formulated together and I agree with the documentation in the resident's note.

## 2014-01-23 ENCOUNTER — Ambulatory Visit: Payer: Medicare Other | Admitting: Internal Medicine

## 2014-01-23 ENCOUNTER — Ambulatory Visit: Payer: Medicare Other

## 2014-01-24 ENCOUNTER — Encounter: Payer: Self-pay | Admitting: Internal Medicine

## 2014-01-25 ENCOUNTER — Ambulatory Visit: Payer: Medicare Other | Admitting: Internal Medicine

## 2014-01-25 IMAGING — CT CT GUIDANCE TISSUE ABLATION
1 of 17 series · 2 of 32 positions shown, 5 images · non-contrast
Comparison: none

CLINICAL DATA: 2.7 cm left renal mass.  The patient presents for
cryoablation and biopsy.

[Series 8: add scan 5.0 b40f · axial · 0.53mm/px · z∈[-239,-204]mm · 2 of 8 slices shown, 5 images]
[im 1/8  soft-tissue]
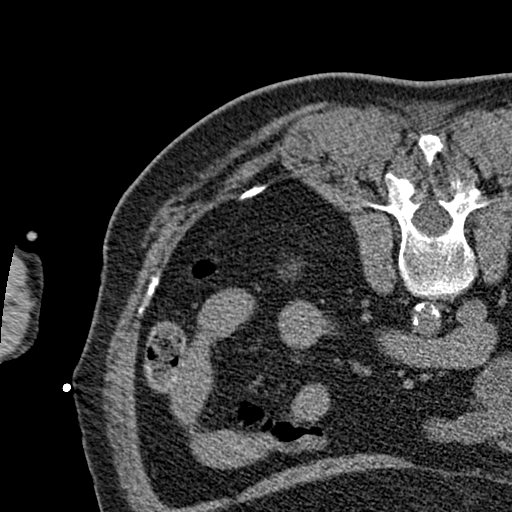
[im 1/8  lung]
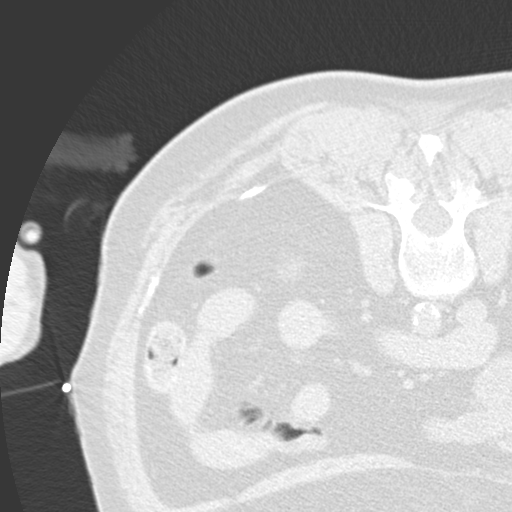
[im 1/8  bone]
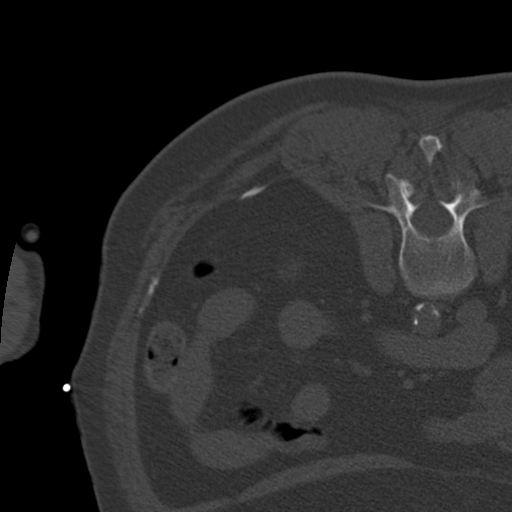
[im 8/8  soft-tissue]
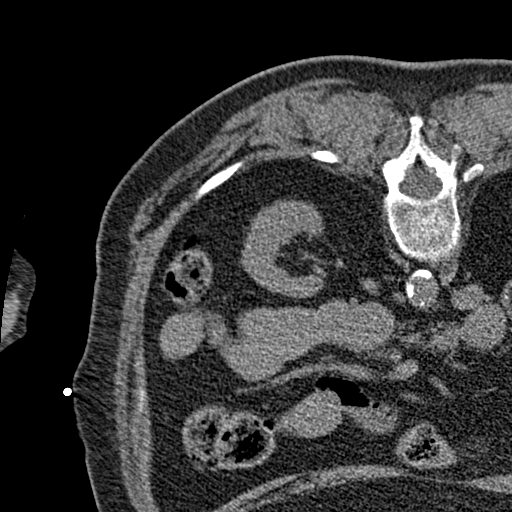
[im 8/8  lung]
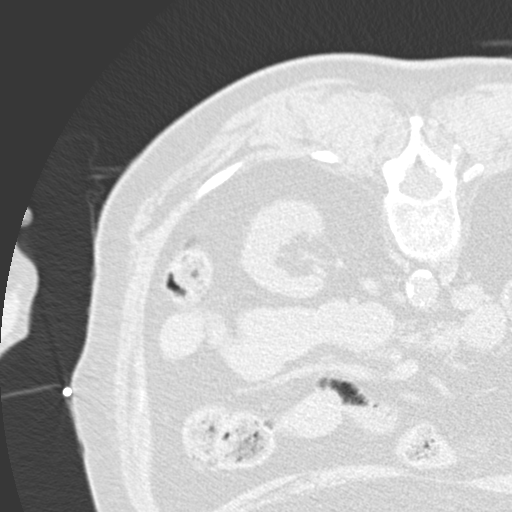

[2 of 32 positions shown; findings below may reference images not displayed]

CT-GUIDED CORE BIOPSY OF LEFT RENAL MASS.
CT-GUIDED PERCUTANEOUS CRYOABLATION OF LEFT RENAL MASS.

Anesthesia:  General

Medications:  2 grams IV Ancef. As antibiotic prophylaxis, Ancef
was ordered pre-procedure and administered intravenously within one
hour of incision.

Procedure:  The procedure, risks, benefits, and alternatives were
explained to the patient.  Questions regarding the procedure were
encouraged and answered.  The patient understands and consents to
the procedure.

The patient was placed under general anesthesia.  Initial
unenhanced CT was performed in a prone position to localize the
left kidney.

The left flank region was prepped with Betadine in a sterile
fashion, and a sterile drape was applied covering the operative
field.  A sterile gown and sterile gloves were used for the
procedure.

A 17 gauge trocar needle was advanced into the left renal mass.
Core biopsy was performed with an 18 gauge automated core biopsy
device.  A total of 2 core samples were submitted in formalin for
pathologic analysis.

Under CT guidance, a total of four Galil Ice Rod Plus percutaneous
cryoablation probes were advanced into the left renal mass.  Probe
positioning was confirmed by CT prior to cryoablation.

Cryoablation was performed through the four probes simultaneously.
Initial 10 minute cycle of cryoablation was performed.  This was
followed by a 8 minute thaw cycle.  A second 10 minute cycle of
cryoablation was then performed.  During ablation, periodic CT
imaging was performed to monitor ice ball formation and morphology.
After active thaw, the cryoablation probes were removed.

Post-procedural CT was performed.

Complications: None
FINDINGS: By initial CT, maximal renal tumor diameter was
approximately 2.8 cm.  After insertion of the probes, cryoablation
resulted in a uniform ice ball that completely encompasses the
tumor.
IMPRESSION: CT guided percutaneous core biopsy and cryoablation of left renal
mass.  The patient will be observed overnight.  Initial follow-up
will be performed in approximately 4 weeks.

## 2014-02-02 ENCOUNTER — Telehealth: Payer: Self-pay | Admitting: Internal Medicine

## 2014-02-02 NOTE — Telephone Encounter (Signed)
Reason for call:   I received a call from [name] at 12:20 am indicating that she was delayed in falling asleep last night until 5am and slept till 5pm.  She reports that she appropriately did not repeat missed insulin doses and her sugars was 215 when she awoke.  It appears her more pressing concern is that she had a bowel movement that at the end was soft.  And then she had a second bowel movement that was also soft.  She fears that she is starting to have diarrhea again and this will interfere with plans for a Xmas eve dinner tomorrow.  She is not having any bloodly BM and no fever.   Pertinent Data:   No fever, chills, blood in stool.  2 loose BM   Assessment / Plan / Recommendations:   Advised patient that this is unlikely to be an infectious source of diarrhea and that she should continue to monitor her stools.  I advised her to wash her hands with soap frequently as a best practice.  She will call back if things worsen or go to urgent care.  She will call the clinic in the morning if not improved.     Lucious Groves, DO   02/02/2014, 12:22 AM

## 2014-02-06 ENCOUNTER — Ambulatory Visit: Payer: Medicare Other

## 2014-02-06 ENCOUNTER — Other Ambulatory Visit: Payer: Self-pay | Admitting: Internal Medicine

## 2014-02-07 NOTE — Telephone Encounter (Signed)
Patient's pcp unavailable-refill request sent to [facility] attending [facility].

## 2014-02-13 ENCOUNTER — Ambulatory Visit (INDEPENDENT_AMBULATORY_CARE_PROVIDER_SITE_OTHER): Payer: PPO | Admitting: Pharmacist

## 2014-02-13 DIAGNOSIS — Z7901 Long term (current) use of anticoagulants: Secondary | ICD-10-CM

## 2014-02-13 DIAGNOSIS — I480 Paroxysmal atrial fibrillation: Secondary | ICD-10-CM

## 2014-02-13 LAB — POCT INR: INR: 2.8

## 2014-02-13 NOTE — Progress Notes (Signed)
Anti-Coagulation Progress Note  [name] is a 64 y.o. female who is currently on an anti-coagulation regimen.    RECENT RESULTS: Recent results are below, the most recent result is correlated with a dose of 17.5 mg. per week: Lab Results  Component Value Date   INR 2.80 02/13/2014   INR 2.11* 01/18/2014   INR 1.79* 01/17/2014    ANTI-COAG DOSE: Anticoagulation Dose Instructions as of 02/13/2014      [name] Tue Wed Thu Fri Sat   New Dose 2.5 mg 5 mg 2.5 mg 2.5 mg 5 mg 2.5 mg 2.5 mg       ANTICOAG SUMMARY: Anticoagulation Episode Summary    Current INR goal 2.0-3.0  Next INR check 03/13/2014  INR from last check 2.80 (02/13/2014)  Weekly max dose   Target end date   [facility]  Preferred lab   Send INR reminders to [place]   Indications  Paroxysmal atrial fibrillation [I48.0] Long term (current) use of anticoagulants [Z79.01]        Comments       Anticoagulation Care Providers    Provider Role Specialty Phone number   Lelon Perla, MD  Cardiology [phone]      ANTICOAG TODAY: Anticoagulation Summary as of 02/13/2014    INR goal 2.0-3.0  Selected INR 2.80 (02/13/2014)  Next INR check 03/13/2014  Target end date    Indications  Paroxysmal atrial fibrillation [I48.0] Long term (current) use of anticoagulants [Z79.01]      Anticoagulation Episode Summary    [facility]   Preferred lab    Send INR reminders to [place]   Comments     Anticoagulation Care Providers    Provider Role Specialty Phone number   Lelon Perla, MD  Cardiology [phone]      PATIENT INSTRUCTIONS: Patient Instructions  Patient instructed to take medications as defined in the Anti-coagulation Track section of this encounter.  Patient instructed to take today's dose.  Patient verbalized understanding of these instructions.       FOLLOW-UP Return in 4 weeks (on 03/13/2014) for  Follow up INR at 0900h.  Jorene Guest, III Pharm.D., CACP

## 2014-02-13 NOTE — Patient Instructions (Signed)
Patient instructed to take medications as defined in the Anti-coagulation Track section of this encounter.  Patient instructed to take today's dose.  Patient verbalized understanding of these instructions.    

## 2014-02-17 ENCOUNTER — Ambulatory Visit (INDEPENDENT_AMBULATORY_CARE_PROVIDER_SITE_OTHER): Payer: PPO | Admitting: Internal Medicine

## 2014-02-17 ENCOUNTER — Encounter: Payer: Self-pay | Admitting: Internal Medicine

## 2014-02-17 VITALS — BP 138/68 | HR 74 | Temp 97.8°F | Wt 198.1 lb

## 2014-02-17 DIAGNOSIS — M940 Chondrocostal junction syndrome [Tietze]: Secondary | ICD-10-CM

## 2014-02-17 DIAGNOSIS — Z794 Long term (current) use of insulin: Secondary | ICD-10-CM

## 2014-02-17 DIAGNOSIS — F17210 Nicotine dependence, cigarettes, uncomplicated: Secondary | ICD-10-CM

## 2014-02-17 DIAGNOSIS — B3731 Acute candidiasis of vulva and vagina: Secondary | ICD-10-CM

## 2014-02-17 DIAGNOSIS — E669 Obesity, unspecified: Secondary | ICD-10-CM

## 2014-02-17 DIAGNOSIS — M810 Age-related osteoporosis without current pathological fracture: Secondary | ICD-10-CM

## 2014-02-17 DIAGNOSIS — G8929 Other chronic pain: Secondary | ICD-10-CM

## 2014-02-17 DIAGNOSIS — F329 Major depressive disorder, single episode, unspecified: Secondary | ICD-10-CM

## 2014-02-17 DIAGNOSIS — Z720 Tobacco use: Secondary | ICD-10-CM

## 2014-02-17 DIAGNOSIS — Z23 Encounter for immunization: Secondary | ICD-10-CM

## 2014-02-17 DIAGNOSIS — E785 Hyperlipidemia, unspecified: Secondary | ICD-10-CM

## 2014-02-17 DIAGNOSIS — J439 Emphysema, unspecified: Secondary | ICD-10-CM

## 2014-02-17 DIAGNOSIS — M545 Low back pain, unspecified: Secondary | ICD-10-CM

## 2014-02-17 DIAGNOSIS — Z7901 Long term (current) use of anticoagulants: Secondary | ICD-10-CM

## 2014-02-17 DIAGNOSIS — F339 Major depressive disorder, recurrent, unspecified: Secondary | ICD-10-CM

## 2014-02-17 DIAGNOSIS — E1140 Type 2 diabetes mellitus with diabetic neuropathy, unspecified: Secondary | ICD-10-CM

## 2014-02-17 DIAGNOSIS — I480 Paroxysmal atrial fibrillation: Secondary | ICD-10-CM

## 2014-02-17 DIAGNOSIS — B373 Candidiasis of vulva and vagina: Secondary | ICD-10-CM

## 2014-02-17 LAB — POCT GLYCOSYLATED HEMOGLOBIN (HGB A1C): Hemoglobin A1C: 7.7

## 2014-02-17 LAB — GLUCOSE, CAPILLARY: Glucose-Capillary: 235 mg/dL — ABNORMAL HIGH (ref 70–99)

## 2014-02-17 MED ORDER — SERTRALINE HCL 100 MG PO TABS: 200.0000 mg | ORAL_TABLET | Freq: Every day | ORAL | Status: DC

## 2014-02-17 MED ORDER — HYDROCODONE-ACETAMINOPHEN 5-325 MG PO TABS: 1.0000 | ORAL_TABLET | Freq: Four times a day (QID) | ORAL | Status: DC | PRN

## 2014-02-17 MED ORDER — NYSTATIN 100000 UNIT/GM EX CREA: 1.0000 "application " | TOPICAL_CREAM | Freq: Two times a day (BID) | CUTANEOUS | Status: DC

## 2014-02-17 MED ORDER — BUPROPION HCL 100 MG PO TABS: 100.0000 mg | ORAL_TABLET | Freq: Two times a day (BID) | ORAL | Status: DC

## 2014-02-17 MED ORDER — INSULIN ASPART 100 UNIT/ML CARTRIDGE (PENFILL): 18.0000 [IU] | Freq: Three times a day (TID) | SUBCUTANEOUS | Status: DC

## 2014-02-17 MED ORDER — INSULIN DETEMIR 100 UNIT/ML FLEXPEN: 45.0000 [IU] | PEN_INJECTOR | Freq: Two times a day (BID) | SUBCUTANEOUS | Status: DC

## 2014-02-17 NOTE — Assessment & Plan Note (Signed)
She is had no further problems with the central costochondritis she had previously experienced.

## 2014-02-17 NOTE — Assessment & Plan Note (Signed)
She states that currently her vulvovaginal candidiasis is controlled on the as needed nystatin cream. We will therefore continue this as needed therapy and reassess control at the follow-up visit.

## 2014-02-17 NOTE — Patient Instructions (Signed)
It was great to see you again.  We made several changes today in hopes of making you better.  1) We changed your humalog back to novolag at your request.  Otherwise keep taking your insulin as you are.  2) We stopped the paxil in case it is causing your diarrhea and restarted the sertraline 200 mg daily and wellbutrin 100 mg twice daily.  3)  I am hopeful the welbutrin may also help you quit smoking again.  4) We gave you a new pneumonia shot today.  5)  I will see you back in 3 months, sooner if necessary.

## 2014-02-17 NOTE — Assessment & Plan Note (Signed)
Unfortunately, she restarted her smoking after her most recent hospital discharge. She can not give me a reason for restarting her smoking but states she is not mentally ready to quit yet. That being said, she said that she would quit again once mentally prepared. We have started the Wellbutrin 100 mg by mouth twice daily as an adjunct for her depression and I'm hopeful this will help assist in her smoking cessation efforts when she is mentally ready.

## 2014-02-17 NOTE — Progress Notes (Signed)
   Subjective:    Patient ID: [name], female    DOB: 02/20/1950, 64 y.o.   MRN: [id number]  HPI  Please see the A&P for the status of the pt's chronic medical problems.  Review of Systems  Constitutional: Positive for fatigue and unexpected weight change. Negative for activity change and appetite change.  Respiratory: Positive for shortness of breath and wheezing. Negative for cough, chest tightness and stridor.   Cardiovascular: Positive for leg swelling. Negative for chest pain and palpitations.  Gastrointestinal: Positive for diarrhea. Negative for abdominal pain, constipation and abdominal distention.  Musculoskeletal: Positive for back pain and gait problem.  Skin: Negative for color change, rash and wound.  Neurological: Positive for headaches.  Psychiatric/Behavioral: Positive for sleep disturbance, dysphoric mood and decreased concentration. The patient is nervous/anxious.       Objective:   Physical Exam  Constitutional: She is oriented to person, place, and time. She appears well-developed and well-nourished. No distress.  HENT:  Head: Normocephalic and atraumatic.  Eyes: Conjunctivae are normal. Right eye exhibits no discharge. Left eye exhibits no discharge. No scleral icterus.  Neck: Normal range of motion. Neck supple.  Cardiovascular: Normal rate and regular rhythm.  Exam reveals no gallop and no friction rub.   Murmur heard. Pulmonary/Chest: Effort normal and breath sounds normal. No respiratory distress. She has no wheezes. She has no rales. She exhibits no tenderness.  Abdominal: Soft. Bowel sounds are normal. She exhibits no distension. There is no tenderness. There is no rebound and no guarding.  Musculoskeletal: Normal range of motion. She exhibits edema. She exhibits no tenderness.  Neurological: She is alert and oriented to person, place, and time. She exhibits normal muscle tone.  Skin: Skin is warm and dry. No rash noted. She is not diaphoretic. No  erythema.  Psychiatric: She has a normal mood and affect. Her behavior is normal. Judgment and thought content normal.  Nursing note and vitals reviewed.     Assessment & Plan:   Please see problem oriented charting.

## 2014-02-17 NOTE — Assessment & Plan Note (Signed)
Her back pain is stable on the as needed hydrocodone and this will be continued at 1-2 tablets every 6 hours as needed for pain, dispense #180. She has this month's prescription and I gave her an additional 2 prescriptions for the following 2 months given the difficulty she has with transportation and picking these prescriptions are up.

## 2014-02-17 NOTE — Assessment & Plan Note (Signed)
Her symptoms are stable despite restarting smoking. The importance of smoking cessation was again stressed and she is fully aware of the risks. We started Wellbutrin 100 mg by mouth twice daily for her depression, but I am hopeful that it will also have a positive impact on her ability to quit smoking. We are continuing the Advair twice daily, tiotropium once daily, and albuterol as needed. We will reassess her symptoms on these inhalers and continuous home O2 at the follow-up visit.

## 2014-02-17 NOTE — Assessment & Plan Note (Signed)
She is tolerating her high intensity statin well. Given her calculated risk she requires this level of therapy. Although performance measures note that a lipid panel is due today, according to the most recent guidelines it is not necessary. We will therefore continue with the rosuvastatin 20 mg by mouth daily and not check a lipid panel as it is an unnecessary expense that will not change management.

## 2014-02-17 NOTE — Assessment & Plan Note (Signed)
Her hemoglobin A1c today was 7.7 which is improved from 8.5 at the last visit. Her target A1c is less than 8.0 so we are at target. With the change in her insurance on January 1 she is asking that the Humalog be converted back to NovoLog which she preferred. She will take 18 units before meals 3 times daily as well as use a sliding scale. She states she is taking Levemir 45 units twice daily and this will be continued. She is up-to-date on her diabetic health care maintenance. We will reassess the control of her diabetes at the follow-up visit with a repeat hemoglobin A1c on the regimen is outlined above.

## 2014-02-17 NOTE — Assessment & Plan Note (Signed)
Now that she has no insurance her out-of-pocket expenses are decreased. She will schedule the DEXA scan to follow-up on her osteoporosis.

## 2014-02-17 NOTE — Assessment & Plan Note (Signed)
Her weight is down 9 pounds from 1 month ago. Some of this may be related to edema from her right heart failure but she does admit to decreased appetite recently, possibly secondary to her depression over the holidays, as well as loose stools. Nonetheless, she was encouraged to continue to decrease portion sizes and watch her caloric intake in order to maintain further weight loss which I believe will help her general feeling of well being as well as improve her dyspnea.

## 2014-02-17 NOTE — Assessment & Plan Note (Signed)
She was due for a pneumococcal 13 vaccination and this was provided today. She is otherwise up-to-date on her general health care maintenance.

## 2014-02-17 NOTE — Assessment & Plan Note (Signed)
She has been taking the Paxil 40 mg by mouth every morning. Although she has good days she also mentions that she has some very bad days with regards to her mood. The holidays are especially difficult for her. In addition, she's had recent loose stools believed to be dating back to when the Paxil was started in place of the sertraline. It is quite possible these loose stools, which she is very distressed about, may be secondary to the Paxil given its known side effect profile and timing. We decided to stop the Paxil and restart the sertraline at the previous dose of 200 mg by mouth daily. We will also start Wellbutrin 100 mg by mouth twice daily as the sertraline was ineffective in completely managing her major depression alone. We will reassess her mood at the follow-up visit with these changes in hopes that her loose stools will also improve with the change back to the sertraline and discontinuance of the Paxil.

## 2014-02-17 NOTE — Assessment & Plan Note (Signed)
On exam today she appeared to be in a regular rhythm. Her rate is controlled while in a regular rhythm and she is being maintained on warfarin anticoagulation. We will continue with the anticoagulation for her presumed paroxysmal atrial fibrillation.

## 2014-02-28 IMAGING — CT CT ABDOMEN WO/W CM
2 of 5 series · 11 of 32 positions shown, 17 images · IV contrast (OMNIPAQUE)
Comparison: MRI of the abdomen 07/10/2011.  CT of the abdomen
09/18/2010.

CLINICAL DATA: 4 weeks status post renal ablation.  The left upper
quadrant flank pain since the procedure.

CT ABDOMEN WITHOUT AND WITH CONTRAST
TECHNIQUE: Multidetector CT imaging of the abdomen was performed
following the standard protocol before and during bolus
administration of intravenous contrast.
Contrast: 100mL OMNIPAQUE IOHEXOL 300 MG/ML  SOLN

[Series 2: non_contrast 5.0 b40f st · axial · 0.74mm/px · z∈[-286,-236]mm · 2 of 61 slices shown]
[im 11/61  soft-tissue]
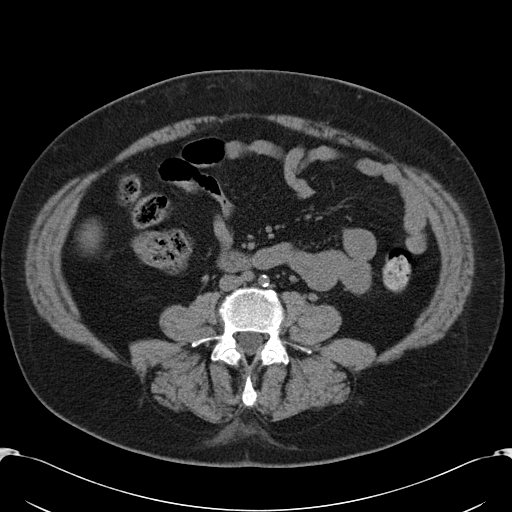
[im 21/61  soft-tissue]
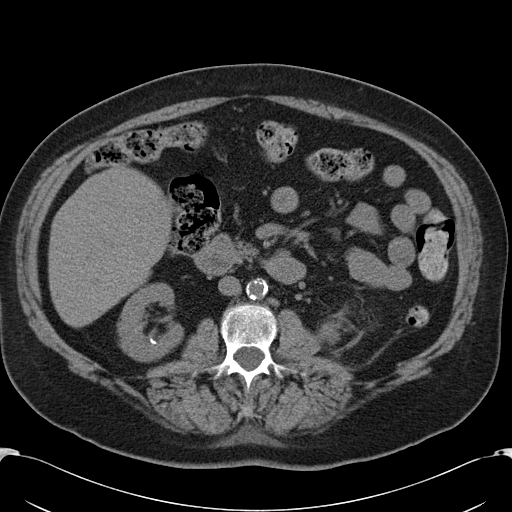

[Series 3: arterial_(id) 3.0 b40f st · axial · 0.74mm/px · z∈[-306,-66]mm · 9 of 101 slices shown, 15 images]
[im 11/101  soft-tissue]
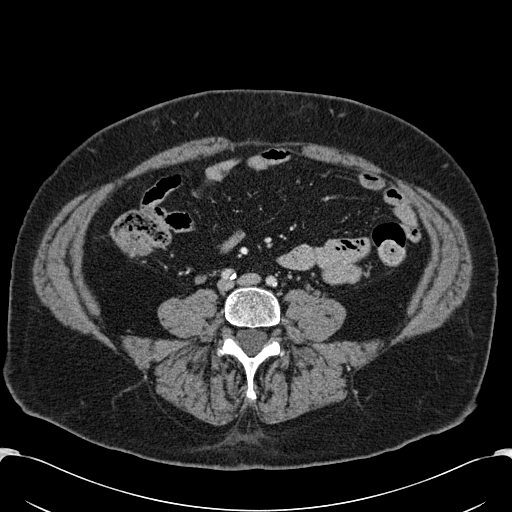
[im 11/101  bone]
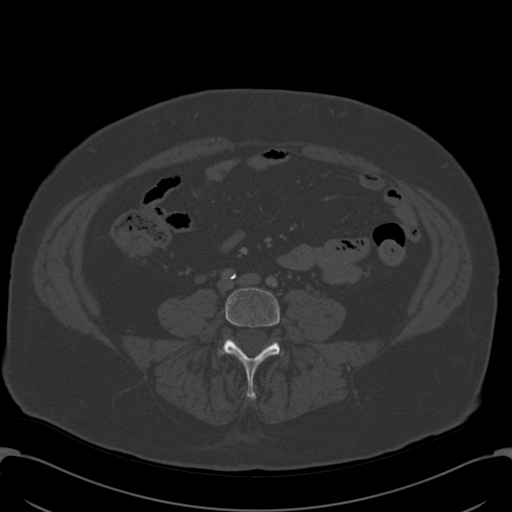
[im 21/101  soft-tissue]
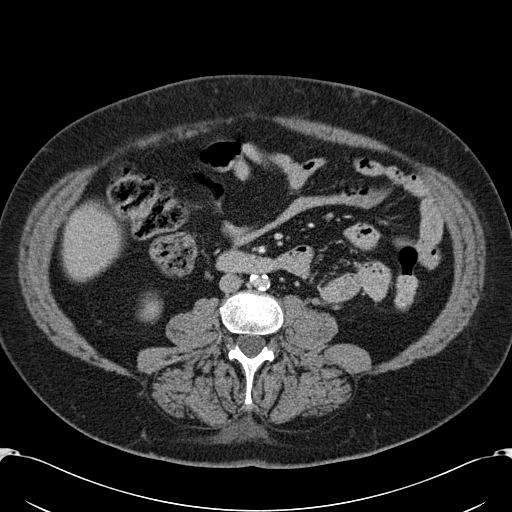
[im 31/101  soft-tissue]
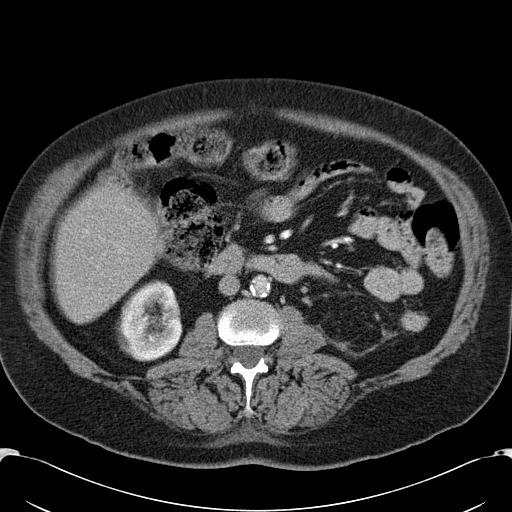
[im 41/101  soft-tissue]
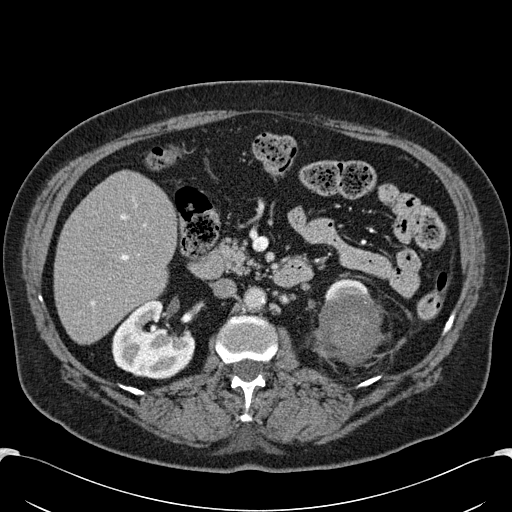
[im 51/101  soft-tissue]
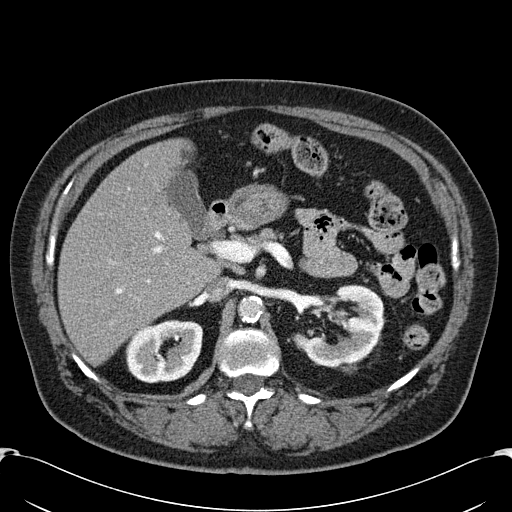
[im 61/101  soft-tissue]
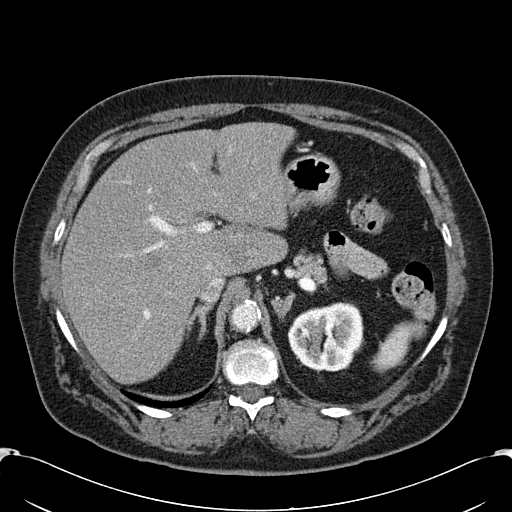
[im 61/101  lung]
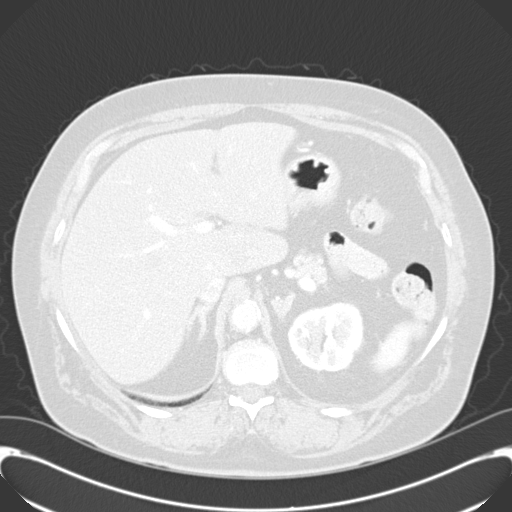
[im 71/101  soft-tissue]
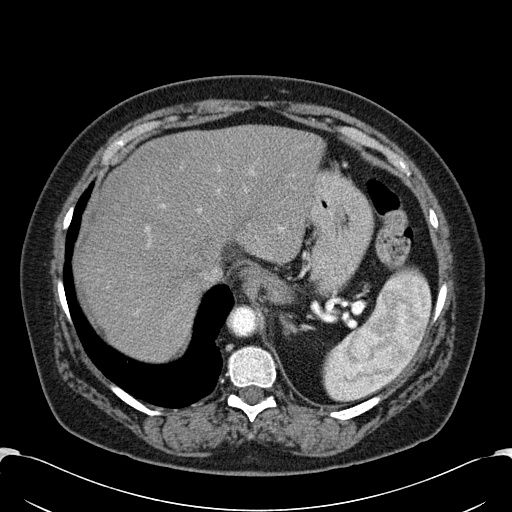
[im 71/101  lung]
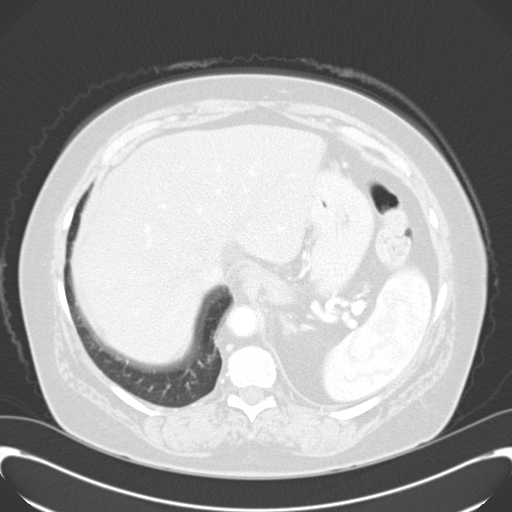
[im 81/101  soft-tissue]
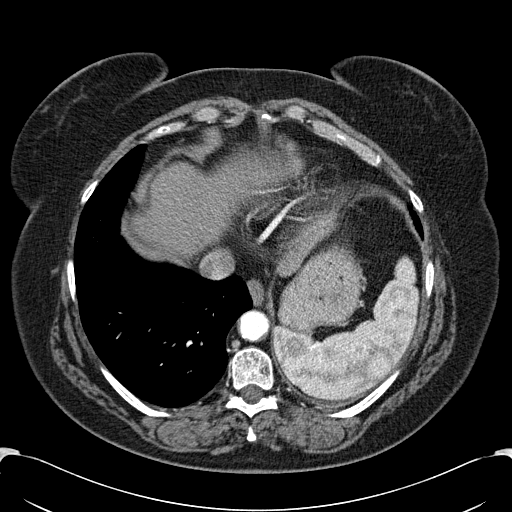
[im 81/101  lung]
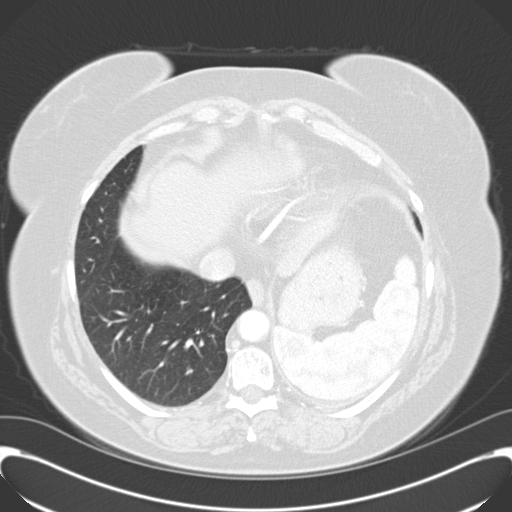
[im 91/101  soft-tissue]
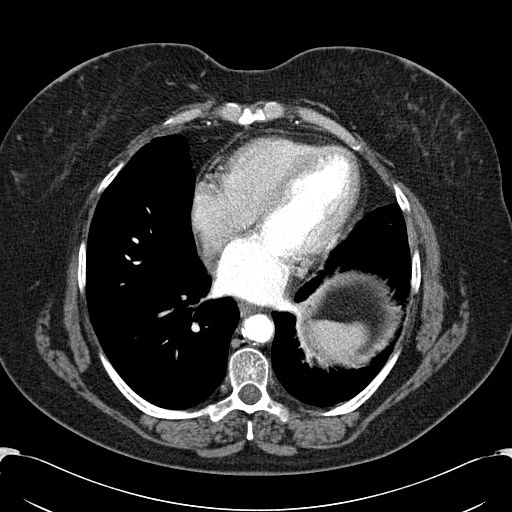
[im 91/101  lung]
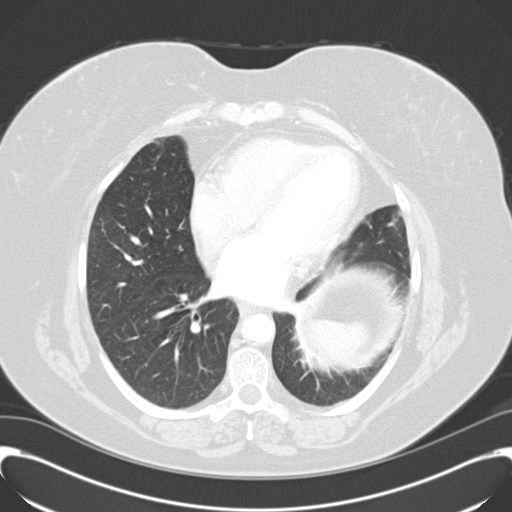
[im 91/101  bone]
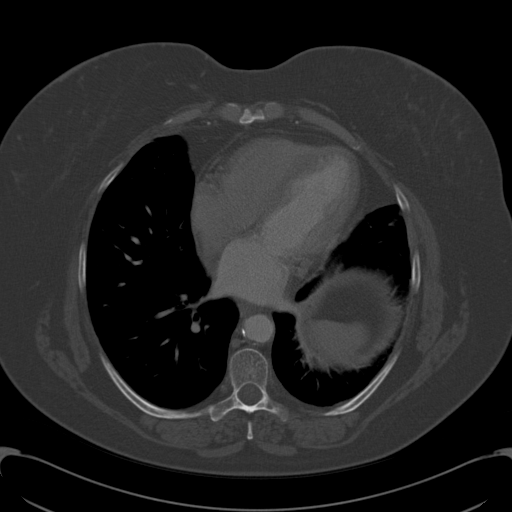

[11 of 32 positions shown; findings below may reference images not displayed]

FINDINGS: Lung Bases: Areas of mild scarring noted in the lung bases
bilaterally.  Status post median sternotomy.  Bioprosthetic mitral
valve.  Lipomatous hypertrophy of the interatrial septum is
incidentally noted.

Abdomen:  In the posterior aspect of the lower pole of the left
kidney there is a 5.1 x 4.6 cm area of the lack of enhancement that
demonstrates heterogeneous attenuation, with surrounding
perinephric stranding.  The appearance of this region is compatible
with recent cryoablation.  No definite residual mass is identified,
and today's study will serve as a new baseline for future follow up
examinations.

There are some unusual appearing focal areas of decreased
attenuation in the liver most pronounced in segment 4B adjacent to
the gallbladder fossa, and within the anterior aspect of the
caudate lobe adjacent to the fissure for the ligamentum venosum.
These areas remain low attenuation on postcontrast images, and
although unusual in appearance, are favored to represent areas of
focal fatty infiltration.  Several other scattered sub centimeter
low attenuation lesions are also seen throughout the liver, which
are too small to definitively characterize.

The appearance of the gallbladder, pancreas, spleen and right
adrenal gland is unremarkable.  Some mild nodular thickening of the
left adrenal gland is unchanged.  A 4 mm nonobstructive calculus in
the right renal collecting system.  There are a few areas of
parenchymal thinning in the right kidney, and subcentimeter low-
attenuation lesions within the right kidney that correspond with
small cysts noted on prior MRI.

Atherosclerosis of the abdominal and pelvic vasculature, without
evidence of aneurysm or dissection.  Within the visualized portions
of the abdomen and there is no ascites, pneumoperitoneum,
pathologic distension of bowel or definite pathologic
lymphadenopathy.  Numerous reactive sized retroperitoneal lymph
nodes are noted.

Musculoskeletal: There are no aggressive appearing lytic or blastic
lesions noted in the visualized portions of the skeleton.
IMPRESSION: 1.  Expected post procedural changes of cryoablation in the lower
pole of the left kidney, without definitive evidence of residual
disease.  Today's study will serve as a new baseline for future
follow up examinations.
2.  Unusual appearing areas of low attenuation and decreased
enhancement within the liver, as above, more prominent than prior
examinations.  These are in characteristic locations for focal fat,
and that is the favored diagnosis at this time.  Continued
attention on future follow up studies is recommended.
3.  Additional tiny subcentimeter low attenuation lesions in the
liver are too small to definitively characterize.  Attention on
follow-up is recommended.
4.  Atherosclerosis.
5.  Mild nodular thickening of the left adrenal gland is unchanged,
and likely to represent adenomatous hyperplasia (demonstrated on
recent MRI).
6.  Lipomatous hypertrophy of the interatrial septum.
7.  Status post median sternotomy for bioprosthetic mitral valve
replacement.

## 2014-03-09 ENCOUNTER — Telehealth: Payer: Self-pay | Admitting: *Deleted

## 2014-03-09 NOTE — Telephone Encounter (Signed)
I called [name] about her concerns.  I am not sure what the cause of her soft stools are now that the change in the antidepressants did not solve the problem.  She does not have profuse diarrhea so clinically I am not concerned about recurrent C. Diff.  She was fine with reassessing the issue at her follow-up visit.  I also told her that I was following the AHA/ACC cholesterol guidelines from 2013 which recommended against yearly lipid panels and suggested it was the intensity of the statin therapy that was most important.  I assured her that she was on a high intensity statin and that period lipid panels would not change management or outcomes but would increase her costs from unnecessary testing.  She was satisfied with this explantation.

## 2014-03-09 NOTE — Telephone Encounter (Signed)
1) pt states every time she looks at [facility] it says her cholesterol needs to be checked, can you put an order in and she will get it done at her next [facility] appt 2) has paperwork for [facility] that needs to be completed, will give to [place]. csw on Monday 3) she continues to have diarrhea, no worse, no better, she has been on her new regimen of antidepressants for 20 days now, i have advised her to wait possibly 2 more weeks unless it gets worse or more irritating to skin to see if her meds will make a difference, she is agreeable and will f/u for an update

## 2014-03-13 ENCOUNTER — Ambulatory Visit (INDEPENDENT_AMBULATORY_CARE_PROVIDER_SITE_OTHER): Payer: PPO | Admitting: Pharmacist

## 2014-03-13 DIAGNOSIS — Z7901 Long term (current) use of anticoagulants: Secondary | ICD-10-CM | POA: Diagnosis not present

## 2014-03-13 DIAGNOSIS — I480 Paroxysmal atrial fibrillation: Secondary | ICD-10-CM

## 2014-03-13 LAB — POCT INR: INR: 2.4

## 2014-03-13 NOTE — Patient Instructions (Signed)
Patient instructed to take medications as defined in the Anti-coagulation Track section of this encounter.  Patient instructed to take today's dose.  Patient verbalized understanding of these instructions.   Hassie Bruce, Pharm. D. Clinical Pharmacy Resident Pager: [phone] Ph: [phone] 03/13/2014 4:08 PM

## 2014-03-13 NOTE — Progress Notes (Signed)
  Anti-Coagulation Progress Note [name] is a 63 y.o. female who is currently on an anti-coagulation regimen.  RECENT RESULTS: Recent results are below, the most recent result is correlated with a dose of 22.5 mg. per week: Lab Results  Component Value Date   INR 2.40 03/13/2014   INR 2.80 02/13/2014   INR 2.11* 01/18/2014   ANTI-COAG DOSE: Anticoagulation Dose Instructions as of 03/13/2014      [name] Tue Wed Thu Fri Sat   New Dose 2.5 mg 5 mg 2.5 mg 2.5 mg 5 mg 2.5 mg 2.5 mg      ANTICOAG SUMMARY: Anticoagulation Episode Summary    Current INR goal 2.0-3.0  Next INR check 04/17/2014  INR from last check 2.40 (03/13/2014)  Weekly max dose   Target end date   [facility]  Preferred lab   Send INR reminders to [facility]   Indications  Paroxysmal atrial fibrillation [I48.0] Long term (current) use of anticoagulants [Z79.01]        Comments       Anticoagulation Care Providers    Provider Role Specialty Phone number   Lelon Perla, MD  Cardiology [phone]     ANTICOAG TODAY: Anticoagulation Summary as of 03/13/2014    INR goal 2.0-3.0  Selected INR 2.40 (03/13/2014)  Next INR check 04/17/2014  Target end date    Indications  Paroxysmal atrial fibrillation [I48.0] Long term (current) use of anticoagulants [Z79.01]      Anticoagulation Episode Summary    [facility]   Preferred lab    Send INR reminders to [facility]   Comments     Anticoagulation Care Providers    Provider Role Specialty Phone number   Lelon Perla, MD  Cardiology [phone]     PATIENT INSTRUCTIONS: Patient Instructions  Patient instructed to take medications as defined in the Anti-coagulation Track section of this encounter.  Patient instructed to take today's dose.  Patient verbalized understanding of these instructions.   Hassie Bruce, Pharm. D. Clinical Pharmacy Resident Pager:  [phone] Ph: [phone] 03/13/2014 4:08 PM       FOLLOW-UP Return in about 5 weeks (around 04/17/2014) for Next INR at 9:15 AM.  Hassie Bruce, Pharm. D. Clinical Pharmacy Resident Pager: [phone] Ph: [phone] 03/13/2014 4:26 PM

## 2014-04-04 ENCOUNTER — Other Ambulatory Visit: Payer: Self-pay | Admitting: Internal Medicine

## 2014-04-04 DIAGNOSIS — K219 Gastro-esophageal reflux disease without esophagitis: Secondary | ICD-10-CM

## 2014-04-04 DIAGNOSIS — E1140 Type 2 diabetes mellitus with diabetic neuropathy, unspecified: Secondary | ICD-10-CM

## 2014-04-05 MED ORDER — INSULIN DETEMIR 100 UNIT/ML FLEXPEN: 45.0000 [IU] | PEN_INJECTOR | Freq: Two times a day (BID) | SUBCUTANEOUS | Status: DC

## 2014-04-05 NOTE — Telephone Encounter (Signed)
[name],  Please call [name] and clarify how many times a day she is taking the omeprazole.  I believe she is taking it twice a day, yet the prescription reads once a day.  Before I refill it I want to make sure I have it right.  Thank You.

## 2014-04-10 ENCOUNTER — Telehealth: Payer: Self-pay | Admitting: Internal Medicine

## 2014-04-10 NOTE — Telephone Encounter (Signed)
Reason for call:   I received a call from [name] at 9:17 PM indicating that "she did something stupid", she fell. She went to the kitchen to get some ice and came back and stepped over dog and fell backwards. She reports hitting her head on the back of a desk. Does not feel a knot. Headache before fall and still present. Not really tender to palpation but headache has moved from front to back.  Denies LOC. Was able to get up, went back to the kitchen to get more ice, and let her dog out. Speaking in full sentences at this time and answering questions appropriately.    Pertinent Data:   Chronic back pain--hydrocodone 1-2 tablets every 6 hours as needed.   Headache before fall, still present 6-7/10. Not new for her, she normally has headaches. Pan was initially on front of head and now since fall is felt a little on the back of her head. Fall was approximately 15 minutes ago.   PAF on coumadin, took full pill today, 46m. INR 2.4 on 03/13/14.   Lives alone and does not drive. Sister works 3rd shift and is sleeping so cannot bring her to the hospital. She does not wish to call the ambulance or come the ED.   Denies dizziness, nausea, vomiting, or blurry vision. No chest pain. Chronic shortness of breath. Smoker.    Took her Levemir 45 units this morning and 18 units plus sliding scale with her meals (28 units twice so far today). Has not checked her blood sugar tonight but will do so before taking night time insulin.   Reports feeling okay at this time.    Assessment / Plan / Recommendations:   Ms. EStineris a 64year old female with PAF on coumadin who called tonight s/p trip and fall at home ~15 minutes ago. She reports hitting the back of her head on the desk but no LOC. She had a headache before the fall that is not new for her and continues to have one now that is stable in severity, rated as not bad per patient 6-10.  Given that she is on coumadin and had a fall, I have  recommended Ms. Chuang to come to the ED or UC for further evaluation. She is currently refusing. Initially said she does not have a ride and I proposed that she can call the paramedics who can evaluate her further. She said she will decide if she wishes to call them. If she does not come, I have let her know that I can send a message to the clinic for hopefully a first available AM appointment. She said she can have transportation for appointment tomorrow.   I cautioned her on symptoms to monitor for including but not limited to chest pain, shortness of breath, feeling light-headed, no improvement or worsening of headache, syncope or near syncope, nausea, vomiting, or change in vision.   She voiced understanding of our discussion and again I strongly encouraged her to come in for further evaluation. She again said she will consider it and let uKore[place]now if needed. I let her know I am available on the on call pager all night to call back if she needs anything further.   As always, pt is advised that if symptoms worsen or new symptoms arise, they should go to an urgent care facility or to to ER for further evaluation.  I will forward this note to PCP and also to clinic  RN triage and front desk for appointment in AM if possible.    Wilber Oliphant, MD   04/10/2014, 9:26 PM

## 2014-04-11 ENCOUNTER — Telehealth: Payer: Self-pay | Admitting: *Deleted

## 2014-04-11 NOTE — Telephone Encounter (Signed)
See note

## 2014-04-11 NOTE — Telephone Encounter (Signed)
Pt was called in RE: to dr qureshi's note, she refuses appt today, states she will call if she has a problem

## 2014-04-11 NOTE — Telephone Encounter (Signed)
Noted. Thanks

## 2014-04-13 ENCOUNTER — Ambulatory Visit (HOSPITAL_COMMUNITY): Payer: PPO

## 2014-04-17 ENCOUNTER — Ambulatory Visit (HOSPITAL_COMMUNITY)
Admission: RE | Admit: 2014-04-17 | Discharge: 2014-04-17 | Disposition: A | Discharge status: Home / Self Care | Payer: PPO | Source: Ambulatory Visit | Attending: Internal Medicine | Admitting: Internal Medicine

## 2014-04-17 ENCOUNTER — Ambulatory Visit (INDEPENDENT_AMBULATORY_CARE_PROVIDER_SITE_OTHER): Payer: PPO | Admitting: Pharmacist

## 2014-04-17 DIAGNOSIS — Z780 Asymptomatic menopausal state: Secondary | ICD-10-CM | POA: Diagnosis not present

## 2014-04-17 DIAGNOSIS — I480 Paroxysmal atrial fibrillation: Secondary | ICD-10-CM

## 2014-04-17 DIAGNOSIS — Z13820 Encounter for screening for osteoporosis: Secondary | ICD-10-CM | POA: Insufficient documentation

## 2014-04-17 DIAGNOSIS — M810 Age-related osteoporosis without current pathological fracture: Secondary | ICD-10-CM

## 2014-04-17 LAB — POCT INR: INR: 3.8

## 2014-04-17 NOTE — Telephone Encounter (Signed)
Sorry for the delay- I spoke with [name] today and she's currently taking the omeprazole one time daily. She was taking 2 times a day, but insurance will no longer cover BID dosing.Regenia Skeeter, Darlene Cassady3/7/20169:20 AM

## 2014-04-17 NOTE — Patient Instructions (Signed)
Patient educated about medication as defined in this encounter and verbalized understanding by repeating back instructions provided.

## 2014-04-17 NOTE — Progress Notes (Signed)
CLINICAL PHARMACY ANTICOAGULATION MANAGEMENT [name] is a 64 y.o. female who reports to the clinic for monitoring of warfarin treatment.    Indication: atrial fibrillation Duration: indefinite  Anticoagulation Clinic Visit History: Anticoagulation Episode Summary    Current INR goal 2.0-3.0  Next INR check 05/15/2014  INR from last check 3.8! (04/17/2014)  Weekly max dose   Target end date   INR check location [facility]  Preferred lab   Send INR reminders to [place]   Indications  Paroxysmal atrial fibrillation [I48.0] Long term (current) use of anticoagulants [Z79.01]        Comments       Anticoagulation Care Providers    Provider Role Specialty Phone number   Lelon Perla, MD  Cardiology [phone]     ASSESSMENT Recent Results: Recent results are below, the most recent result is correlated with a dose of 22.5 mg per week: Lab Results  Component Value Date   INR 3.8 04/17/2014   INR 2.40 03/13/2014   INR 2.80 02/13/2014    INR today: Supratherapeutic Findings: otherwise negative  Anticoagulation Dosing: INR as of 04/17/2014 and Previous Dosing Information    INR Dt INR Goal [employer] Sun Mon Tue Wed Thu Fri Sat   04/17/2014 3.8 2.0-3.0 22.5 mg 2.5 mg 5 mg 2.5 mg 2.5 mg 5 mg 2.5 mg 2.5 mg    Anticoagulation Dose Instructions as of 04/17/2014      Total Sun Mon Tue Wed Thu Fri Sat   New Dose 20 mg 2.5 mg 5 mg 2.5 mg 2.5 mg 2.5 mg 2.5 mg 2.5 mg     (5 mg x 0.5)  (5 mg x 1)  (5 mg x 0.5)  (5 mg x 0.5)  (5 mg x 0.5)  (5 mg x 0.5)  (5 mg x 0.5)                         Description        Patient has had diarrhea      PLAN Weekly dose was decreased by 11% to 20 mg per week. Patient was instructed to hold x 1 dose today  Patient Instructions: Patient Instructions  Patient educated about medication as defined in this encounter and verbalized understanding by repeating back instructions provided.     Follow-up Return  in about 4 weeks (around 05/15/2014) for Follow up INR on 05/15/14 at 9.  Flossie Dibble, PharmD BCPS, BCACP

## 2014-04-18 LAB — HM DIABETES EYE EXAM

## 2014-04-19 IMAGING — CR DG LUMBAR SPINE COMPLETE 4+V
5 series · 5 of 5 positions shown · non-contrast
Comparison: Lumbar radiographs 07/28/2007.

CLINICAL DATA: 61-year-old female status post fall with low back
pain.

LUMBAR SPINE - COMPLETE 4+ VIEW

[t l-spine a.p.]
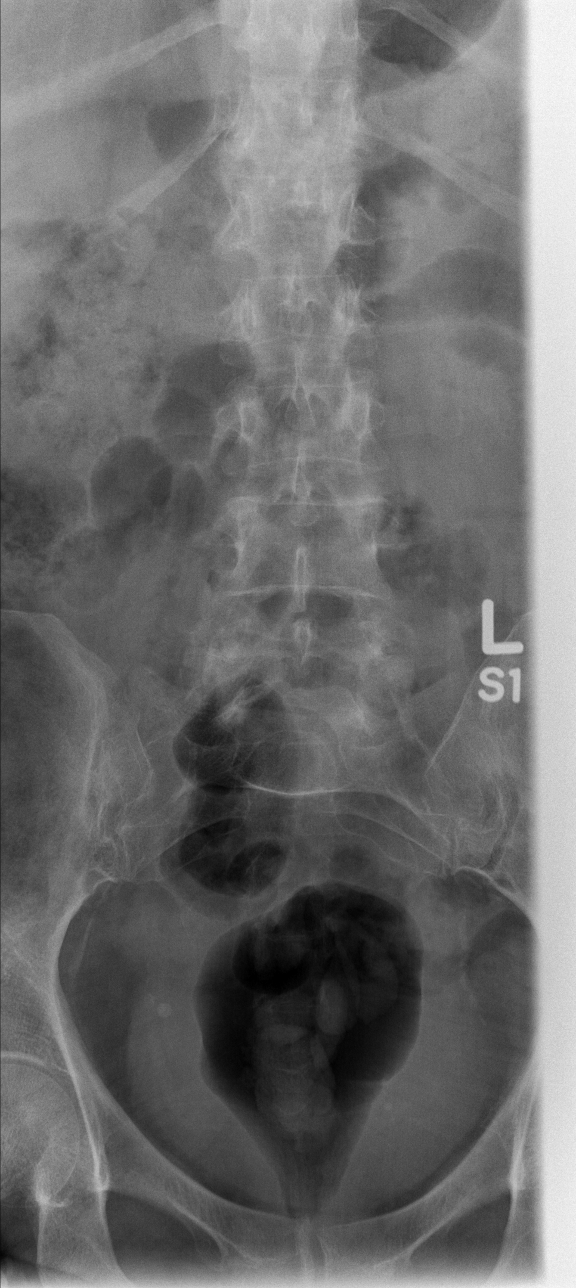

[t l-spine oblique exposure (1 of 2)]
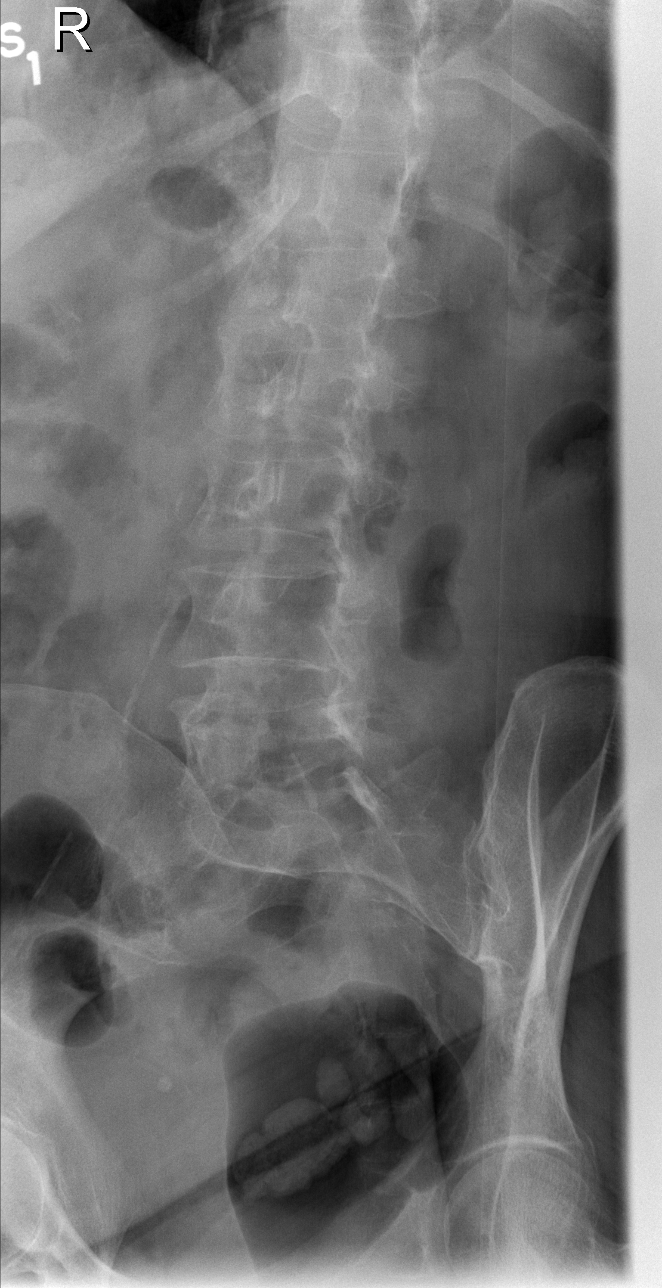

[t l-spine oblique exposure (2 of 2)]
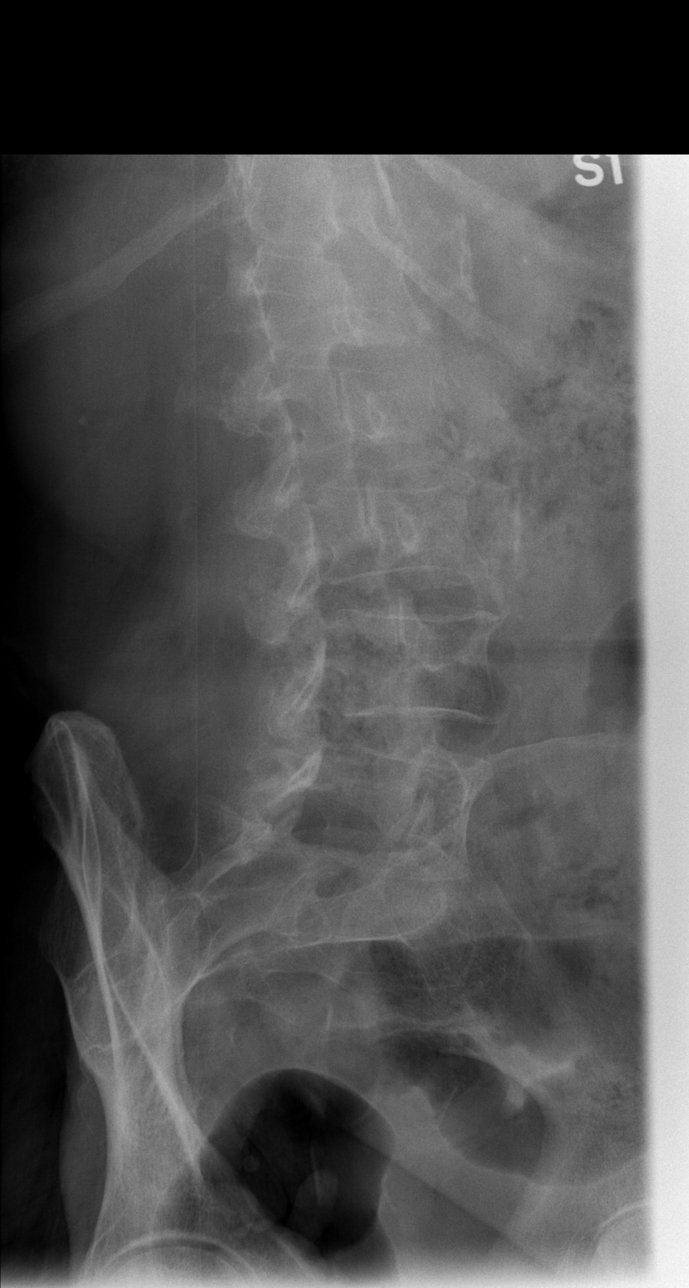

[t l-spine lat *]
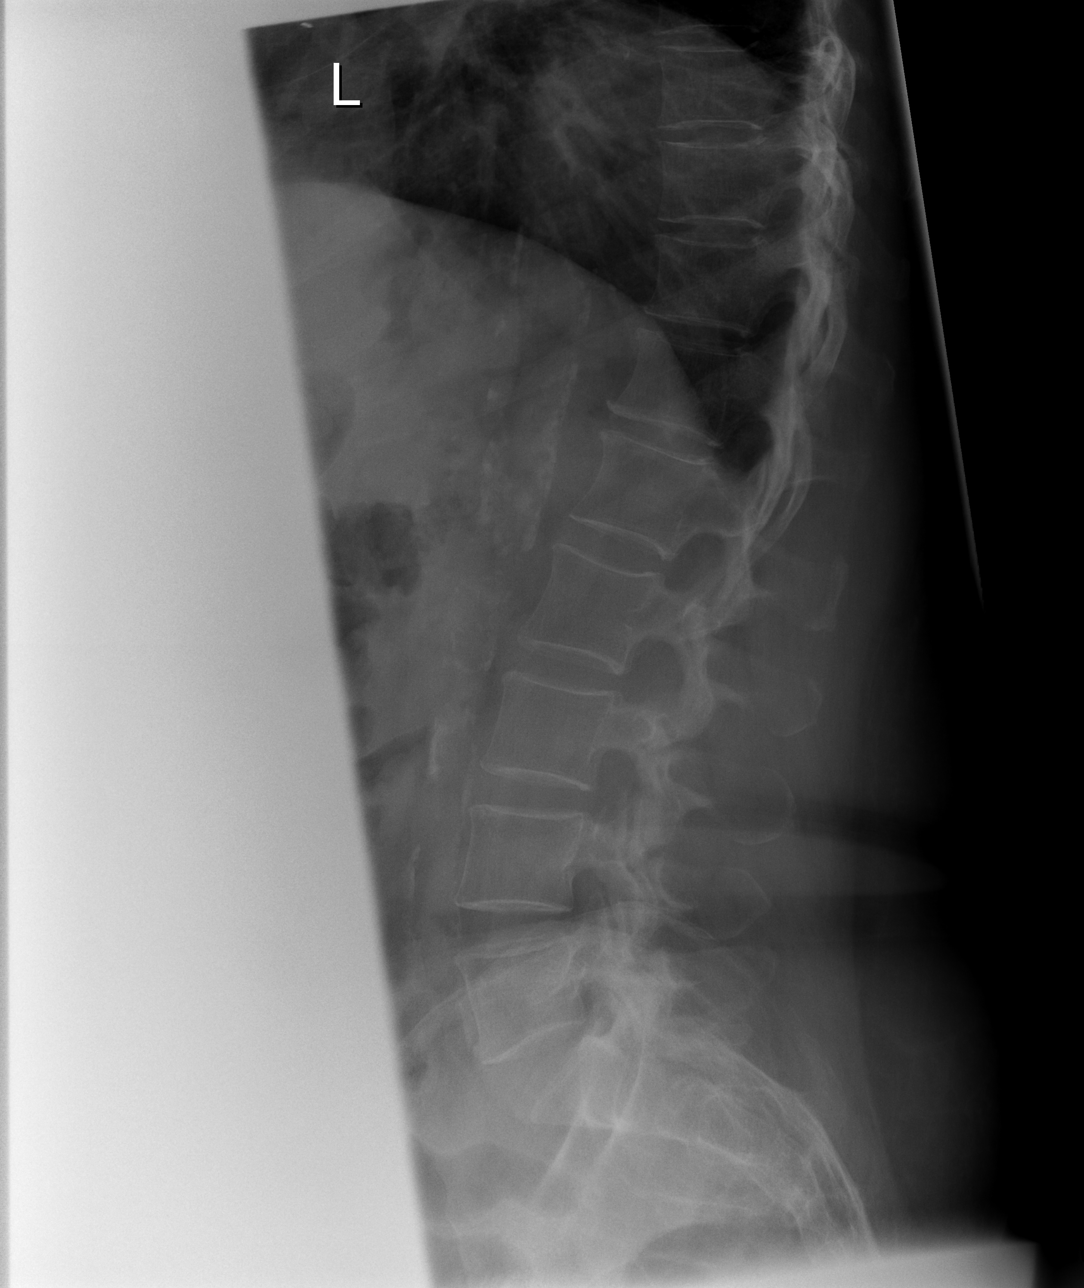

[t l-spine l5-s1 spot]
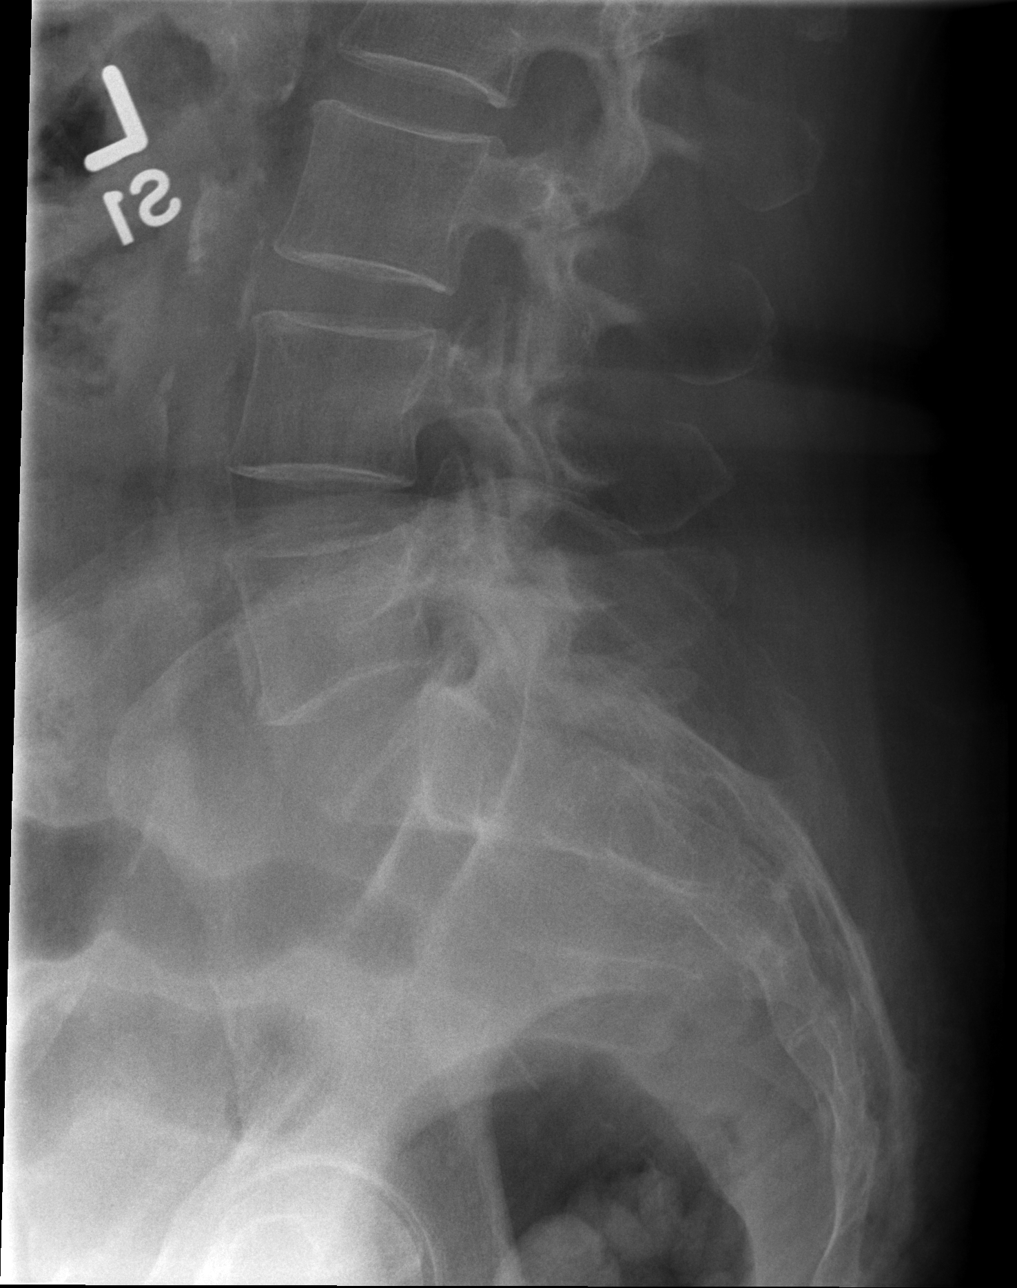

[5 of 5 positions shown; findings below may reference images not displayed]

FINDINGS: Normal lumbar segmentation.  Stable and normal lumbar
vertebral height alignment.  Stable and relatively preserved disc
spaces. Incidental spina bifida occulta at S1.  There is a degree
of chronic osteopenia.  No pars fracture.  Chronic moderate L5-S1
facet hypertrophy appears increased.  Grossly intact sacral ala and
SI joints.  Visualized lower thoracic levels appear stable.
IMPRESSION: No acute fracture or listhesis identified in the lumbar spine.
Chronic L5-S1 facet hypertrophy.

## 2014-04-19 IMAGING — CT CT HEAD W/O CM
1 of 2 series · 16 of 30 positions shown, 20 images · non-contrast
Comparison: 06/02/2011 and earlier.

CLINICAL DATA: 61-year-old female status post fall, dizziness,
pain, hematoma

CT HEAD WITHOUT CONTRAST
TECHNIQUE: Contiguous axial images were obtained from the base of
the skull through the vertex without contrast.

[Series 2: head trauma 4.8 h37s · axial · 0.45mm/px · z∈[+1108,+1236]mm · 16 of 30 slices shown, 20 images]
[im 2/30  brain]
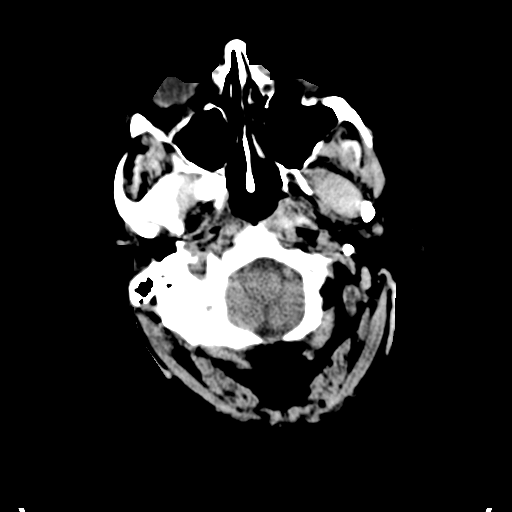
[im 2/30  bone]
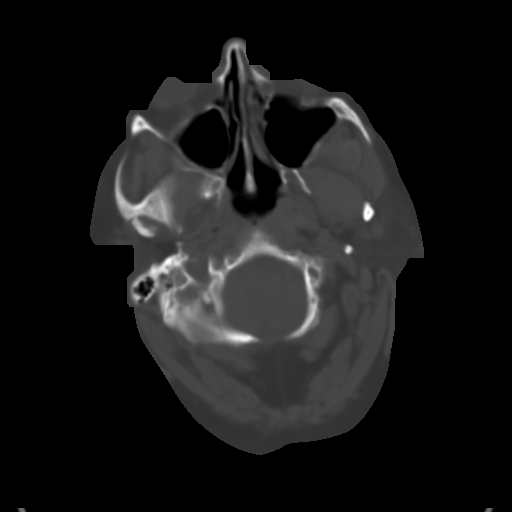
[im 3/30  brain]
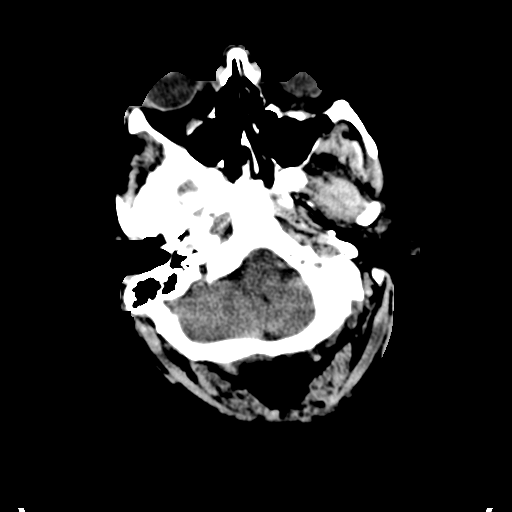
[im 5/30  brain]
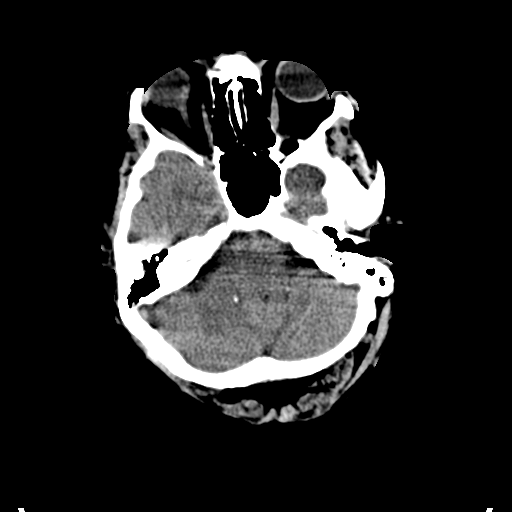
[im 8/30  brain]
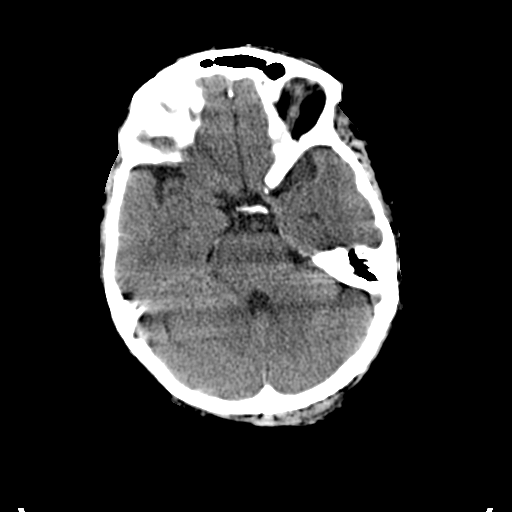
[im 9/30  brain]
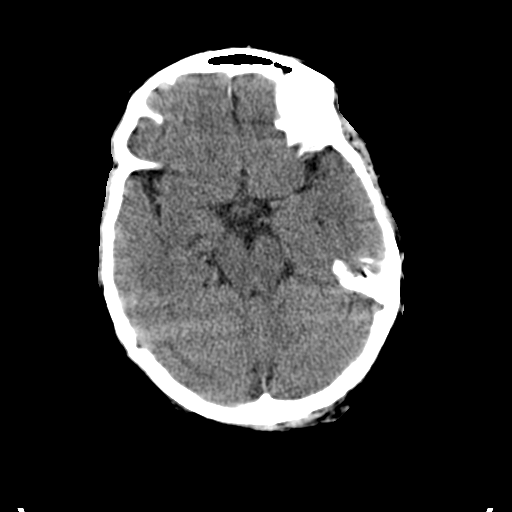
[im 9/30  bone]
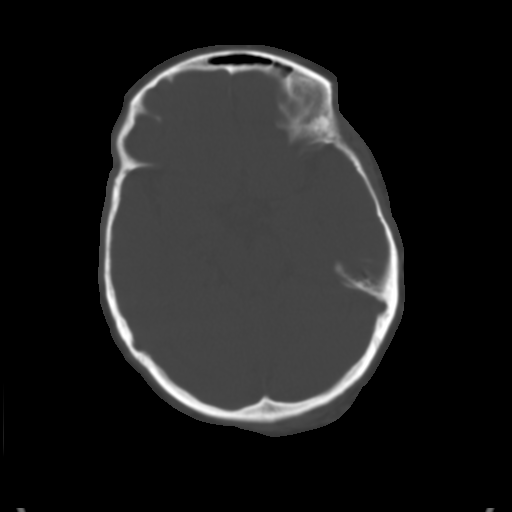
[im 11/30  brain]
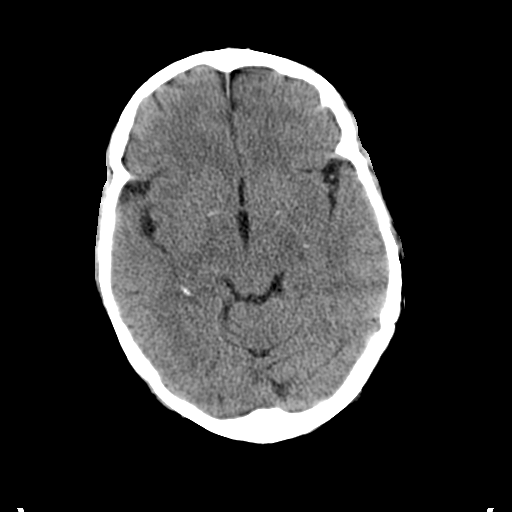
[im 12/30  brain]
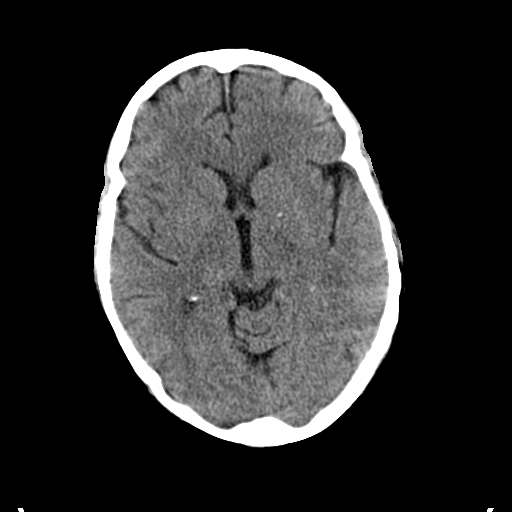
[im 14/30  brain]
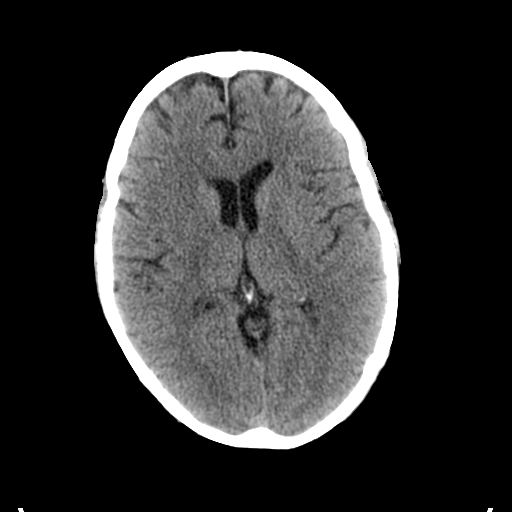
[im 16/30  brain]
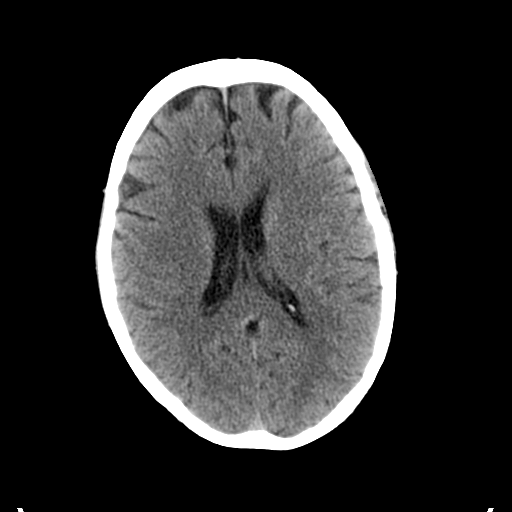
[im 16/30  bone]
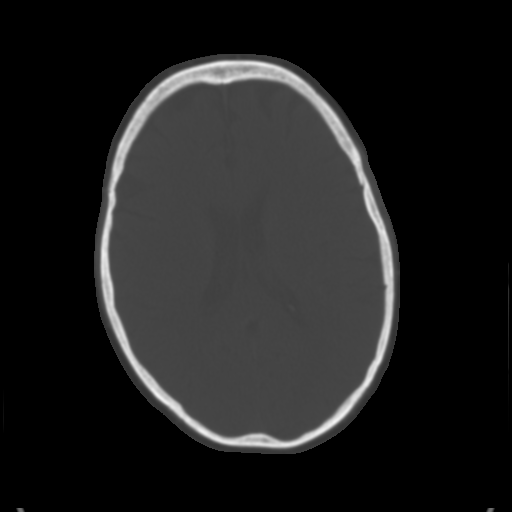
[im 18/30  brain]
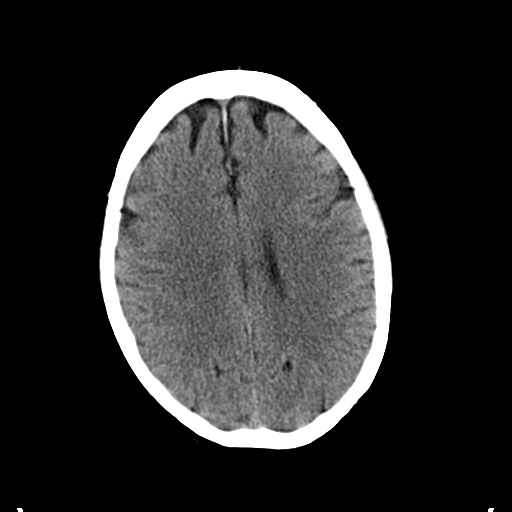
[im 19/30  brain]
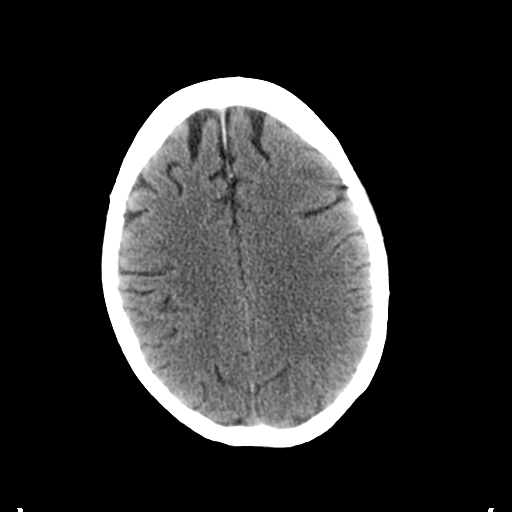
[im 21/30  brain]
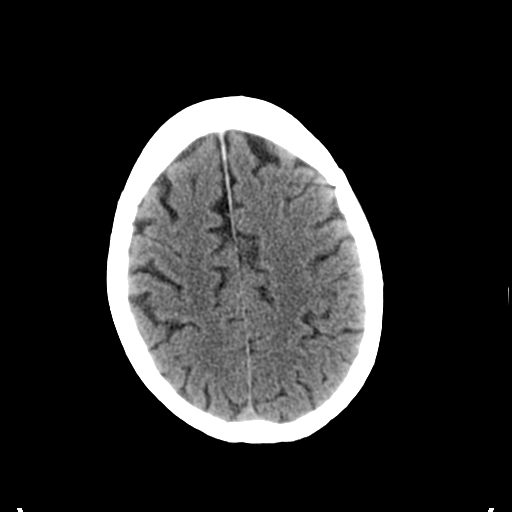
[im 22/30  brain]
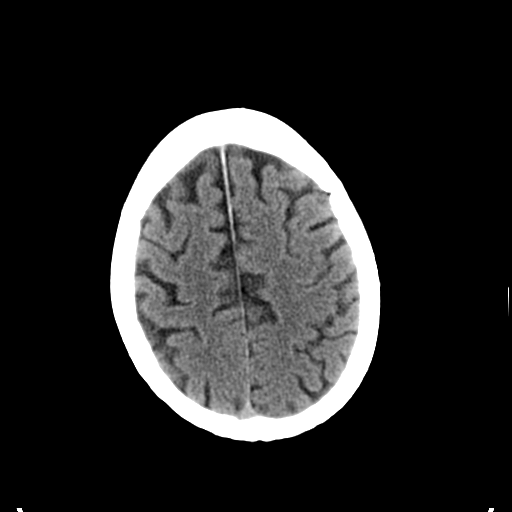
[im 22/30  bone]
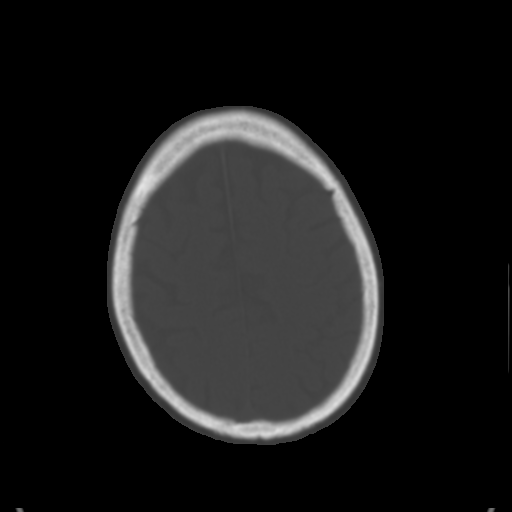
[im 25/30  brain]
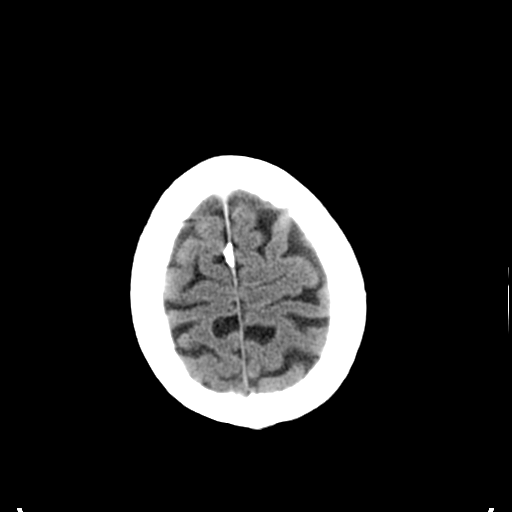
[im 27/30  brain]
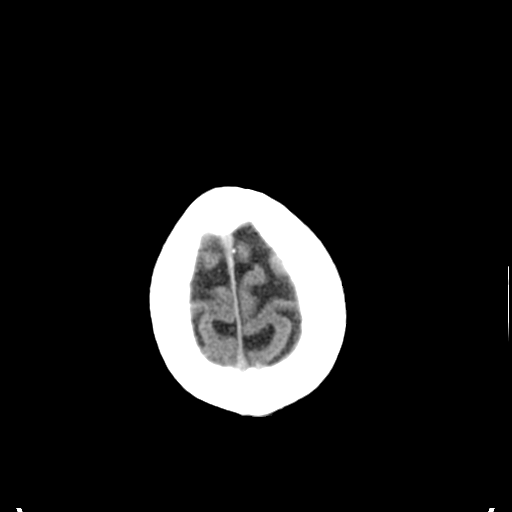
[im 28/30  brain]
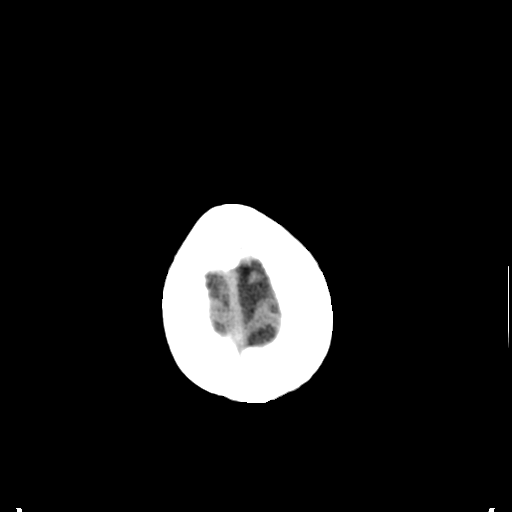

[16 of 30 positions shown; findings below may reference images not displayed]

FINDINGS: Visualized paranasal sinuses and mastoids are clear.
Visualized orbit soft tissues are within normal limits.  No focal
scalp hematoma identified. No acute osseous abnormality identified.

Stable and normal cerebral volume.  No ventriculomegaly. No midline
shift, mass effect, or evidence of mass lesion.  No evidence of
cortically based acute infarction identified.  No acute
intracranial hemorrhage identified.  Scattered dural
calcifications. No suspicious intracranial vascular hyperdensity.
IMPRESSION: Normal noncontrast appearance of the brain.  No acute traumatic
injury identified.

## 2014-05-02 ENCOUNTER — Other Ambulatory Visit: Payer: Self-pay | Admitting: Internal Medicine

## 2014-05-02 DIAGNOSIS — B373 Candidiasis of vulva and vagina: Secondary | ICD-10-CM

## 2014-05-02 DIAGNOSIS — B3731 Acute candidiasis of vulva and vagina: Secondary | ICD-10-CM

## 2014-05-10 ENCOUNTER — Telehealth: Payer: Self-pay | Admitting: *Deleted

## 2014-05-10 ENCOUNTER — Emergency Department (HOSPITAL_COMMUNITY): Payer: PPO

## 2014-05-10 ENCOUNTER — Encounter (HOSPITAL_COMMUNITY): Payer: Self-pay | Admitting: Family Medicine

## 2014-05-10 ENCOUNTER — Emergency Department (HOSPITAL_COMMUNITY)
Admission: EM | Admit: 2014-05-10 | Discharge: 2014-05-10 | Disposition: A | Discharge status: Home / Self Care | Payer: PPO | Attending: Emergency Medicine | Admitting: Emergency Medicine

## 2014-05-10 DIAGNOSIS — E1140 Type 2 diabetes mellitus with diabetic neuropathy, unspecified: Secondary | ICD-10-CM | POA: Diagnosis not present

## 2014-05-10 DIAGNOSIS — Z7901 Long term (current) use of anticoagulants: Secondary | ICD-10-CM | POA: Insufficient documentation

## 2014-05-10 DIAGNOSIS — M1990 Unspecified osteoarthritis, unspecified site: Secondary | ICD-10-CM | POA: Diagnosis not present

## 2014-05-10 DIAGNOSIS — J449 Chronic obstructive pulmonary disease, unspecified: Secondary | ICD-10-CM | POA: Diagnosis not present

## 2014-05-10 DIAGNOSIS — G8929 Other chronic pain: Secondary | ICD-10-CM | POA: Diagnosis not present

## 2014-05-10 DIAGNOSIS — Z9981 Dependence on supplemental oxygen: Secondary | ICD-10-CM | POA: Diagnosis not present

## 2014-05-10 DIAGNOSIS — E785 Hyperlipidemia, unspecified: Secondary | ICD-10-CM | POA: Diagnosis not present

## 2014-05-10 DIAGNOSIS — F419 Anxiety disorder, unspecified: Secondary | ICD-10-CM | POA: Diagnosis not present

## 2014-05-10 DIAGNOSIS — E669 Obesity, unspecified: Secondary | ICD-10-CM | POA: Insufficient documentation

## 2014-05-10 DIAGNOSIS — Z794 Long term (current) use of insulin: Secondary | ICD-10-CM | POA: Diagnosis not present

## 2014-05-10 DIAGNOSIS — K219 Gastro-esophageal reflux disease without esophagitis: Secondary | ICD-10-CM

## 2014-05-10 DIAGNOSIS — Z720 Tobacco use: Secondary | ICD-10-CM | POA: Diagnosis not present

## 2014-05-10 DIAGNOSIS — Z8701 Personal history of pneumonia (recurrent): Secondary | ICD-10-CM | POA: Diagnosis not present

## 2014-05-10 DIAGNOSIS — F329 Major depressive disorder, single episode, unspecified: Secondary | ICD-10-CM | POA: Insufficient documentation

## 2014-05-10 DIAGNOSIS — G4733 Obstructive sleep apnea (adult) (pediatric): Secondary | ICD-10-CM | POA: Insufficient documentation

## 2014-05-10 DIAGNOSIS — Z7952 Long term (current) use of systemic steroids: Secondary | ICD-10-CM | POA: Insufficient documentation

## 2014-05-10 DIAGNOSIS — I5032 Chronic diastolic (congestive) heart failure: Secondary | ICD-10-CM | POA: Insufficient documentation

## 2014-05-10 DIAGNOSIS — Z85528 Personal history of other malignant neoplasm of kidney: Secondary | ICD-10-CM | POA: Insufficient documentation

## 2014-05-10 DIAGNOSIS — R079 Chest pain, unspecified: Secondary | ICD-10-CM | POA: Diagnosis present

## 2014-05-10 DIAGNOSIS — Z79899 Other long term (current) drug therapy: Secondary | ICD-10-CM | POA: Insufficient documentation

## 2014-05-10 LAB — I-STAT TROPONIN, ED: Troponin i, poc: 0 ng/mL (ref 0.00–0.08)

## 2014-05-10 LAB — HEPATIC FUNCTION PANEL
ALT: 20 U/L (ref 0–35)
AST: 22 U/L (ref 0–37)
Albumin: 3.4 g/dL — ABNORMAL LOW (ref 3.5–5.2)
Alkaline Phosphatase: 44 U/L (ref 39–117)
Bilirubin, Direct: <0.1 mg/dL (ref 0.0–0.5)
Total Bilirubin: 0.4 mg/dL (ref 0.3–1.2)
Total Protein: 5.9 g/dL — ABNORMAL LOW (ref 6.0–8.3)

## 2014-05-10 LAB — LIPASE, BLOOD: Lipase: 31 U/L (ref 11–59)

## 2014-05-10 LAB — BASIC METABOLIC PANEL
Anion gap: 11 (ref 5–15)
BUN: 12 mg/dL (ref 6–23)
CO2: 28 mmol/L (ref 19–32)
Calcium: 9.4 mg/dL (ref 8.4–10.5)
Chloride: 99 mmol/L (ref 96–112)
Creatinine, Ser: 1.09 mg/dL (ref 0.50–1.10)
GFR calc Af Amer: 60 mL/min — ABNORMAL LOW (ref >90)
GFR calc non Af Amer: 52 mL/min — ABNORMAL LOW (ref >90)
Glucose, Bld: 216 mg/dL — ABNORMAL HIGH (ref 70–99)
Potassium: 4.2 mmol/L (ref 3.5–5.1)
Sodium: 138 mmol/L (ref 135–145)

## 2014-05-10 LAB — CBC
HCT: 36.5 % (ref 36.0–46.0)
Hemoglobin: 11.7 g/dL — ABNORMAL LOW (ref 12.0–15.0)
MCH: 30.5 pg (ref 26.0–34.0)
MCHC: 32.1 g/dL (ref 30.0–36.0)
MCV: 95.1 fL (ref 78.0–100.0)
Platelets: 191 10*3/uL (ref 150–400)
RBC: 3.84 MIL/uL — ABNORMAL LOW (ref 3.87–5.11)
RDW: 15.5 % (ref 11.5–15.5)
WBC: 7.7 10*3/uL (ref 4.0–10.5)

## 2014-05-10 LAB — PROTIME-INR
INR: 2.21 — ABNORMAL HIGH (ref 0.00–1.49)
Prothrombin Time: 24.7 seconds — ABNORMAL HIGH (ref 11.6–15.2)

## 2014-05-10 MED ORDER — SUCRALFATE 1 G PO TABS
1.0000 g | ORAL_TABLET | Freq: Once | ORAL | Status: AC
  Administered 2014-05-10: 1 g via ORAL
  Filled 2014-05-10: qty 1

## 2014-05-10 MED ORDER — HYDROMORPHONE HCL 1 MG/ML IJ SOLN
0.5000 mg | Freq: Once | INTRAMUSCULAR | Status: AC
  Administered 2014-05-10: 0.5 mg via INTRAVENOUS
  Filled 2014-05-10: qty 1

## 2014-05-10 MED ORDER — GI COCKTAIL ~~LOC~~
30.0000 mL | Freq: Once | ORAL | Status: AC
  Administered 2014-05-10: 30 mL via ORAL
  Filled 2014-05-10: qty 30

## 2014-05-10 MED ORDER — PANTOPRAZOLE SODIUM 40 MG IV SOLR
40.0000 mg | Freq: Once | INTRAVENOUS | Status: AC
  Administered 2014-05-10: 40 mg via INTRAVENOUS
  Filled 2014-05-10: qty 40

## 2014-05-10 MED ORDER — ONDANSETRON HCL 4 MG/2ML IJ SOLN
4.0000 mg | Freq: Once | INTRAMUSCULAR | Status: AC
  Administered 2014-05-10: 4 mg via INTRAVENOUS
  Filled 2014-05-10: qty 2

## 2014-05-10 NOTE — Telephone Encounter (Signed)
Pt calls and states for several days she has "heartburn" type pain in her chest, denies any/ all other s/s of cardiac problem, she does state she has doubled up on her acid reflux med and has no relief, "it feels a little different" than "heart burn" does. She is advised to go to [facility] for eval, there are no open slots in [facility], she is agreeable

## 2014-05-10 NOTE — Consult Note (Signed)
Internal Medicine Consult Note  S: Pt is a 64 y/o female with past medical history of COPD, HTN, tobacco abuse (1.5PPD ), HLD, fibromyalgia, and GERD who presents with heart burn. Pt states that for the past 2 days she has had worsening of her acid reflux. She is on prilosec 58m daily and two days ago started taking prilosec 466mBID. She states she takes her prilosec anytime in the morning and not specifically 30-6068m prior to eating breakfast. Increasing her prilosec to 10m56mD has not helped with acid reflux. She then called the clinic this morning for heart burn pain however there were no available appointments and she was thus referred to the ED. Pt describes her pain as a burning, squeezing pain located at mid epigastric region and upper esophagus. Pain has been constant but she will get intermittent improvement in pain. She denies associated SOB, radiation of pain, and vomiting. She does have decreased appetite and nausea. She follows with Dr. [facility] but has not been able to see them b/c she has an $86 debt balance with them. While discussing her debt pt appeared to have worsening in her heart burn. She was also noted to have reflux and belching during exam. Denies alcohol use, increase in cigarette smoking, change in diet, or recent stress. Pt states the GI cocktail given in the ED did not help with pain but dilaudid momentarily helped with pain. The nitroglycerin given by EMS did not help with pain. As far as her cardiac hx, she has a mitral valve replacement w/ cath in 2012 that was negative for CAD.   O: No significant lab abnormalities, specifically negative troponin x 1. CXR negative for acute abnormalities. EKG on presentation NSR with no significant changes from prior EKG on 12/19/2013. On PE pt had tender to palpation of b/l intercostal rib margins, mid epigastric region and above sternum. Cardiac exam RRR, nor murmurs. Lungs positive for bibasilar crackles, no wheezing. Abd TTP on left  upper quadrant and distended. Neg pedal edema.  A: Pt stable for discharge home. Pt instructed to continue prilosec 10mg67m. She has close family support and states her sister can drive her to clinic tomorrow morning after her eye exam at 7:45am. Pt informed that she does not need an appointment and that Dr. HoffmHeber Carolinad be happy to see her whenever she is able to come in tomorrow. She also has a clinic appt with her PCP Dr. KlimaEppie Gibsonpril 8th. Will give pt another GI cocktail before discharging home. She will eventually need GI follow up and endoscopy. Unlikely pt has a perforated ulcer and physical exam neg for an acute abdomen.  Spoke with attending Dr. BhardEllwood Denserding pt's presentation and Dr. BhardEllwood Densecomfortable above assessment and plan.    DianaJulious [email or web address] Internal Medicine Teaching Service

## 2014-05-10 NOTE — Telephone Encounter (Signed)
Thanks. Agree with ED eval

## 2014-05-10 NOTE — ED Provider Notes (Signed)
Pt seen by the [facility] team.  Pt has history of GERD.  No prior history of ACS.  Sx felt to be related to her GERD.  Plan is to discharge home, follow up with her doctor tomorrow to be rechecked  Dorie Rank, MD 05/10/14 1743

## 2014-05-10 NOTE — ED Notes (Signed)
Internal medicine at bedside

## 2014-05-10 NOTE — ED Notes (Signed)
Pt presents from home via [facility] with c/o chest pain.  Pt reports epigastric/central chest pain that began two days ago.  Pt reports hx of acid reflux and says she feels this is an exacerbation.  Pt has taken additional doses of prilosec and Tums without relief.  Pt noteed to be belching intermittently and endorses nausea. EMS gave 2SL nitro without relief of CP.  Pt A&O, interactive and in NAD.

## 2014-05-10 NOTE — Discharge Instructions (Signed)
Gastroesophageal Reflux Disease, Adult Gastroesophageal reflux disease (GERD) happens when acid from your stomach goes into your food pipe (esophagus). The acid can cause a burning feeling in your chest. Over time, the acid can make small holes (ulcers) in your food pipe.  HOME CARE  Ask your doctor for advice about:  Losing weight.  Quitting smoking.  Alcohol use.  Avoid foods and drinks that make your problems worse. You may want to avoid:  Caffeine and alcohol.  Chocolate.  Mints.  Garlic and onions.  Spicy foods.  Citrus fruits, such as oranges, lemons, or limes.  Foods that contain tomato, such as sauce, chili, salsa, and pizza.  Fried and fatty foods.  Avoid lying down for 3 hours before you go to bed or before you take a nap.  Eat small meals often, instead of large meals.  Wear loose-fitting clothing. Do not wear anything tight around your waist.  Raise (elevate) the head of your bed 6 to 8 inches with wood blocks. Using extra pillows does not help.  Only take medicines as told by your doctor.  Do not take aspirin or ibuprofen. GET HELP RIGHT AWAY IF:   You have pain in your arms, neck, jaw, teeth, or back.  Your pain gets worse or changes.  You feel sick to your stomach (nauseous), throw up (vomit), or sweat (diaphoresis).  You feel short of breath, or you pass out (faint).  Your throw up is green, yellow, black, or looks like coffee grounds or blood.  Your poop (stool) is red, bloody, or black. MAKE SURE YOU:   Understand these instructions.  Will watch your condition.  Will get help right away if you are not doing well or get worse. Document Released: 07/16/2007 Document Revised: 04/21/2011 Document Reviewed: 08/16/2010 [facility] Patient Information 2015 [place], [place]. This information is not intended to replace advice given to you by your health care provider. Make sure you discuss any questions you have with your health care provider.

## 2014-05-10 NOTE — ED Provider Notes (Signed)
CSN: [id number]     Arrival date & time 05/10/14  1253 History   First MD Initiated Contact with Patient 05/10/14 1304     Chief Complaint  Patient presents with  . Chest Pain  . Heartburn     (Consider location/radiation/quality/duration/timing/severity/associated sxs/prior Treatment) Patient is a 64 y.o. female presenting with chest pain and heartburn. The history is provided by the patient (the pt complains of chest pain for a few days).  Chest Pain Pain location:  Substernal area Pain quality: aching   Pain radiates to:  Does not radiate Pain radiates to the back: no   Pain severity:  Moderate Onset quality:  Gradual Timing:  Constant Associated symptoms: heartburn   Associated symptoms: no abdominal pain, no back pain, no cough, no fatigue and no headache   Heartburn Associated symptoms include chest pain. Pertinent negatives include no abdominal pain and no headaches.    Past Medical History  Diagnosis Date  . COPD (chronic obstructive pulmonary disease) with emphysema     PFTs 02/2012: FEV1 0.92 (40%), ratio 69, 27% increase in FEV1 with BD, TLC 91%, severe airtrapping, DLCO49% On chronic home O2. Pulmonary rehab referral 05/2012   . Moderate to severe pulmonary hypertension     2014 TEE w PA peak pressure 46 mmHg, s/p MV replacement   . Tobacco abuse 07/28/2012  . Hyperlipidemia LDL goal < 100 11/20/2005  . Carotid artery stenosis     s/p right endarterectomy (06/2010) Carotid US (07/2010):  Left: Moderate-to-severe (60-79%) calcific and non-calcific plaque origin and proximal ICA and ECA   . Mitral stenosis     s/p Mitral valve replacement with a 27-mm pericardial porcine valve (Medtronic Mosaic valve, serial #[id number] on 09/20/10, Dr. Prescott Gum)   . Chronic congestive heart failure with left ventricular diastolic dysfunction 5/68/1275  . Depression 11/19/2005  . Anxiety 07/24/2010  . Fibromyalgia 08/29/2010  . Obesity, Class II, BMI 35-39.9 10/23/2011  . Adenomatous  polyps 05/14/2011    Colonoscopy (05/2011): 4 mm adenomatous polyp excised endoscopically Colonoscopy (02/2002): Adenomatous polyp excised endoscopically   . Gastroesophageal reflux disease   . Internal hemorrhoids 08/04/2012  . Osteoporosis     DEXA (12/09/2011): L-spine T -3.7, left hip T -1.4 DEXA (12/2004): L-spine T -2.6, left hip -0.1   . Allergic rhinitis 06/01/2012  . Bilateral cataracts 08/04/2012    Visually insignificant   . Chronic constipation 02/03/2011  . Chronic venous insufficiency 08/04/2012  . Paroxysmal atrial fibrillation 10/22/2010    s/p Left atrial maze procedure for paroxysmal atrial fibrillation on 09/20/2010 by Dr Prescott Gum.  Subsequent splenic infarct, decision was made to re-anticoagulate with coumadin, likely life-long as this is the most likely cause of the splenic infarct.   . Chronic low back pain 10/06/2012  . Anemia of chronic disease 01/01/2013  . Exertional shortness of breath   . Obstructive sleep apnea     Nocturnal polysomnography (06/2009): Moderate sleep apnea/ hypopnea syndrome , AHI 17.8 per hour with nonpositional hypopneas. CPAP titration to 12 CWP, AHI 2.4 per hour. On nocturnal CPAP via a small resMed Quattro full-face mask with heated humidifier.   . On home oxygen therapy     "2L when I sleep; day or night; when I leave the house" (01/16/2014)  . Type 2 diabetes mellitus with diabetic neuropathy   . History of blood transfusion     "several times"   . H/O hiatal hernia   . Asthmatic bronchitis     "hx; don't get it  anymore" (01/16/2014)  . Pneumonia     "twice" (01/16/2014)  . Clear cell renal cell carcinoma 07/21/2011    s/p cryoablation of left RCC in 09/2011 by [name]. Followed by [name]  [facility]) .    Marland Kitchen Chronic daily headache 01/16/2014  . Migraine     "used to" (01/16/2014)  . Arthritis     "back and hands" (01/16/2014)   Past Surgical History  Procedure Laterality Date  . Orif clavicle fracture  01/2004    by Thana Farr.  [name], M.D for Right clavicle nonunion.  . Tonsillectomy    . Carotid endarterectomy Right 07/04/2010    by [name] for asymptomatic right carotid artery stenosis  . Lipoma excision  08/2005    occipital lipoma 1.5cm - by Dr. [name]  . Hysteroscopy with endometrial ablation  06/2001    for persistent post-menopausal bleeding // by S. [name], M.D.  . Mitral valve replacement  09/20/10     with a 27-mm pericardial porcine valve (Medtronic Mosaic valve, serial #[id number]). 09/20/10, Dr Prescott Gum  . Maze Left 09/20/10    for paroxysmal atrial fibrillation (Dr. Prescott Gum)  . Chest tube insertion  09/24/2010    Dr Prescott Gum  . Cardiac valve replacement  Aug. 2012    "mitral valve"  . Colonoscopy  05/12/2011    performed by Dr. Michail Sermon. Showing small internal hemorrhoids, single tubular adenoma polyp  . Esophagogastroduodenoscopy  05/12/2011    performed by Dr. Michail Sermon. Negative for ulcerations, biopsy negative for evidence of celiac sprue  . Tubal ligation    . Eye surgery    . Fracture surgery Right 2005    clavicle  . Dilation and curettage of uterus    . Cryoablation Left 09/2011    by [name]. Followed by [name]  [facility]) .     Family History  Problem Relation Age of Onset  . Peptic Ulcer Disease Father   . Heart attack Father 68    Died of MI at age 77  . Heart attack Brother 33    Died of MI at age 77  . Obesity Brother   . Pneumonia Mother   . Healthy Sister   . Lupus Daughter   . Obsessive Compulsive Disorder Daughter    History  Substance Use Topics  . Smoking status: Current Every Day Smoker -- 1.50 packs/day for 49 years    Types: Cigarettes    Start date: 10/31/2013    Last Attempt to Quit: 10/31/2013  . Smokeless tobacco: Never Used  . Alcohol Use: No   OB History    No data available     Review of Systems  Constitutional: Negative for appetite change and fatigue.  HENT: Negative for congestion, ear discharge and sinus pressure.    Eyes: Negative for discharge.  Respiratory: Negative for cough.   Cardiovascular: Positive for chest pain.  Gastrointestinal: Positive for heartburn. Negative for abdominal pain and diarrhea.  Genitourinary: Negative for frequency and hematuria.  Musculoskeletal: Negative for back pain.  Skin: Negative for rash.  Neurological: Negative for seizures and headaches.  Psychiatric/Behavioral: Negative for hallucinations.      Allergies  Lorazepam; Morphine and related; Oxycontin; and Tramadol hcl  Home Medications   Prior to Admission medications   Medication Sig Start Date End Date Taking? Authorizing Provider  albuterol (PROAIR HFA) 108 (90 BASE) MCG/ACT inhaler Inhale 2 puffs into the lungs every 6 (six) hours as needed for wheezing or shortness of  breath. 01/11/14  Yes [name], MD  ALPRAZolam [name]) 1 MG tablet Take 0.5-1 tablets (0.5-1 mg total) by mouth 2 (two) times daily as needed for anxiety. Patient taking differently: Take 0.5-1 mg by mouth 2 (two) times daily. 0.5 mg in the morning and 1 mg in the evening 01/03/14  Yes [name], MD  buPROPion [facility]) 100 MG tablet Take 1 tablet (100 mg total) by mouth 2 (two) times daily. 02/17/14  Yes [name], MD  Calcium Carb-Cholecalciferol 600-800 MG-UNIT TABS Take 1 tablet by mouth 2 (two) times daily.  08/04/12  Yes [name], MD  chlorpheniramine (CHLOR-TRIMETON) 4 MG tablet Take 1 tablet (4 mg total) by mouth 3 (three) times daily as needed for allergies. 10/06/12  Yes [name], MD  EASY TOUCH PEN NEEDLES 31G X 5 MM MISC USE TO INJECT INSULIN 5 TIMES DAILY 10/24/13  Yes [name], MD  ferrous gluconate (FERGON) 324 MG tablet Take 1 tablet (324 mg total) by mouth 2 (two) times daily with a meal. 01/09/13  Yes Jessee Avers, MD  fluconazole (DIFLUCAN) 150 MG tablet Take 1 tablet (150 mg total) by mouth once. 05/03/14  Yes [name], MD  fluticasone (FLONASE) 50 MCG/ACT nasal spray INSTILL 2 SPRAYS  IN EACH NOSTRIL EVERY DAY 02/07/14  Yes [name], MD  Fluticasone-Salmeterol (ADVAIR DISKUS) 250-50 MCG/DOSE AEPB Inhale 1 puff into the lungs 2 (two) times daily. 10/24/13  Yes [name], MD  furosemide (LASIX) 80 MG tablet Take 1 tablet (80 mg total) by mouth 2 (two) times daily. 09/21/13  Yes [name], MD  gabapentin (NEURONTIN) 300 MG capsule Take 1 capsule (300 mg total) by mouth 2 (two) times daily. 11/14/13  Yes [name], MD  HYDROcodone-acetaminophen (NORCO/VICODIN) 5-325 MG per tablet Take 1-2 tablets by mouth every 6 (six) hours as needed for severe pain. 02/17/14  Yes [name], MD  insulin aspart (NOVOLOG) cartridge Inject 18 Units into the skin 3 (three) times daily with meals. Patient taking differently: Inject 18 Units into the skin 3 (three) times daily with meals. 18 units plus sliding scale 02/17/14  Yes [name], MD  Insulin Detemir (LEVEMIR) 100 UNIT/ML Pen Inject 45 Units into the skin 2 (two) times daily. 04/05/14  Yes [name], MD  Lancets Misc. (ACCU-CHEK FASTCLIX LANCET) KIT Check your blood 4 times a day dx code 250.00 insulin requiring 01/19/13  Yes [name], MD  nystatin cream (MYCOSTATIN) Apply 1 application topically 2 (two) times daily. Patient taking differently: Apply 1 application topically daily as needed for dry skin.  02/17/14  Yes [name], MD  omeprazole (PRILOSEC) 40 MG capsule Take 1 capsule (40 mg total) by mouth daily. 04/17/14  Yes [name], MD  potassium chloride SA (K-DUR,KLOR-CON) 20 MEQ tablet Take 1 tablet (20 mEq total) by mouth 2 (two) times daily. 11/14/13  Yes [name], MD  promethazine (PHENERGAN) 12.5 MG tablet Take 1 tablet (12.5 mg total) by mouth every 6 (six) hours as needed for nausea or vomiting. 10/26/13  Yes [name], MD  rosuvastatin (CRESTOR) 20 MG tablet Take 1 tablet (20 mg total) by mouth daily. Patient taking differently: Take 20 mg by mouth at bedtime.  11/14/13  Yes [name], MD  sertraline (ZOLOFT) 100 MG tablet Take 2 tablets (200 mg total) by mouth daily. 02/17/14  Yes [name], MD  tiotropium (SPIRIVA HANDIHALER) 18 MCG inhalation capsule Place 1 capsule (18 mcg total) into inhaler and inhale daily. 11/14/13  Yes  [name], MD  warfarin (COUMADIN) 5 MG tablet Take 2.5-5 mg by mouth daily at 6 PM. 2.5 mg on everyday except on Monday ( 5 mg )   Yes Historical Provider, MD  Probiotic CAPS Take 1 capsule by mouth daily. Patient not taking: Reported on 05/10/2014 08/05/13   [name], MD   BP 126/84 mmHg  Pulse 83  Temp(Src) 98.4 F (36.9 C)  Resp 22  Ht 5' 2" (1.575 m)  SpO2 94% Physical Exam  Constitutional: She is oriented to person, place, and time. She appears well-developed.  HENT:  Head: Normocephalic.  Eyes: Conjunctivae and EOM are normal. No scleral icterus.  Neck: Neck supple. No thyromegaly present.  Cardiovascular: Normal rate and regular rhythm.  Exam reveals no gallop and no friction rub.   No murmur heard. Pulmonary/Chest: No stridor. She has no wheezes. She has no rales. She exhibits no tenderness.  Abdominal: She exhibits no distension. There is no tenderness. There is no rebound.  Musculoskeletal: Normal range of motion. She exhibits no edema.  Lymphadenopathy:    She has no cervical adenopathy.  Neurological: She is oriented to person, place, and time. She exhibits normal muscle tone. Coordination normal.  Skin: No rash noted. No erythema.  Psychiatric: She has a normal mood and affect. Her behavior is normal.    ED Course  Procedures (including critical care time) Labs Review Labs Reviewed  CBC - Abnormal; Notable for the following:    RBC 3.84 (*)    Hemoglobin 11.7 (*)    All other components within normal limits  BASIC METABOLIC PANEL - Abnormal; Notable for the following:    Glucose, Bld 216 (*)    GFR calc non Af Amer 52 (*)    GFR calc Af Amer 60 (*)    All other components within normal limits   HEPATIC FUNCTION PANEL - Abnormal; Notable for the following:    Total Protein 5.9 (*)    Albumin 3.4 (*)    All other components within normal limits  LIPASE, BLOOD  PROTIME-INR  I-STAT TROPOININ, ED    Imaging Review Dg Chest 2 View  05/10/2014   CLINICAL DATA:  Chest pain and heartburn  EXAM: CHEST  2 VIEW  COMPARISON:  12/19/2013  FINDINGS: Normal heart size and stable aortic contours. The patient is status post mitral valve replacement. There is no edema, consolidation, effusion, or pneumothorax. Status post ORIF of the right clavicle. No acute osseous findings.  IMPRESSION: 1. No active cardiopulmonary disease. 2. Mitral valve replacement.   Electronically Signed   By: Monte Fantasia M.D.   On: 05/10/2014 13:42     EKG Interpretation   Date/Time:  Wednesday May 10 2014 13:01:51 EDT Ventricular Rate:  82 PR Interval:  242 QRS Duration: 105 QT Interval:  408 QTC Calculation: 476 R Axis:   -85 Text Interpretation:  Sinus rhythm Prolonged PR interval Markedly  posterior QRS axis Confirmed by Loyde Orth  MD, Iveth Heidemann [phone]) on 05/10/2014  1:37:57 PM Also confirmed by Allex Madia  MD, Broadus John ([id number])  on 05/10/2014  3:29:52 PM      MDM   Final diagnoses:  None    Chest pain,   Med to see pt    Milton Ferguson, MD 05/10/14 1642

## 2014-05-11 ENCOUNTER — Encounter: Payer: Self-pay | Admitting: Internal Medicine

## 2014-05-12 ENCOUNTER — Telehealth: Payer: Self-pay | Admitting: Pharmacist

## 2014-05-12 ENCOUNTER — Telehealth: Payer: Self-pay | Admitting: Internal Medicine

## 2014-05-12 NOTE — Telephone Encounter (Signed)
  Reason for call:   I placed an outgoing call to [name] at 1  PM regarding follow up after her ED visit. She reports she did not see me or come to clinic yesterday because she was feeling slightly better and had a busy day (eye doctor and needed to get groceries).  She reports today she is still feeling slightly better, would prefer not to come in till her visit with Dr. Eppie Horn on Friday so she doesn't have another copay.   Assessment/ Plan:   I advised her to continue her BID omeprazole and to follow up with Dr. Eppie Horn.  She will call the clinic if her heartburn worsens or other issues arrise prior to her appointment.  Will CC Drs [name] and [name] to this note.   Kathryn Groves, DO   05/12/2014, 12:57 PM

## 2014-05-12 NOTE — Telephone Encounter (Signed)
Call to patient to confirm appointment for 05/15/14 at 9:30 lmtcb.

## 2014-05-15 ENCOUNTER — Ambulatory Visit (INDEPENDENT_AMBULATORY_CARE_PROVIDER_SITE_OTHER): Payer: PPO | Admitting: Pharmacist

## 2014-05-15 DIAGNOSIS — I480 Paroxysmal atrial fibrillation: Secondary | ICD-10-CM

## 2014-05-15 DIAGNOSIS — Z7901 Long term (current) use of anticoagulants: Secondary | ICD-10-CM | POA: Diagnosis not present

## 2014-05-15 LAB — POCT INR: INR: 2.1

## 2014-05-15 NOTE — Patient Instructions (Signed)
Patient instructed to take medications as defined in the Anti-coagulation Track section of this encounter.  Patient instructed to take today's dose.  Patient verbalized understanding of these instructions.    

## 2014-05-15 NOTE — Progress Notes (Signed)
Anti-Coagulation Progress Note  SHERRIN STAHLE is a 64 y.o. female who is currently on an anti-coagulation regimen.    RECENT RESULTS: Recent results are below, the most recent result is correlated with a dose of 20 mg. per week: Lab Results  Component Value Date   INR 2.10 05/15/2014   INR 2.21* 05/10/2014   INR 3.8 04/17/2014    ANTI-COAG DOSE: Anticoagulation Dose Instructions as of 05/15/2014      [name] Tue Wed Thu Fri Sat   New Dose 2.5 mg 5 mg 2.5 mg 2.5 mg 5 mg 2.5 mg 2.5 mg    Description        Patient requests specifically a 5 week interval for next appointment citing finances and her ability to pay.        ANTICOAG SUMMARY: Anticoagulation Episode Summary    Current INR goal 2.0-3.0  Next INR check 06/19/2014  INR from last check 2.10 (05/15/2014)  Weekly max dose   Target end date   INR check location [facility]  Preferred lab   Send INR reminders to [place]   Indications  Paroxysmal atrial fibrillation [I48.0] Long term (current) use of anticoagulants [Z79.01]        Comments       Anticoagulation Care Providers    Provider Role Specialty Phone number   Lelon Perla, MD  Cardiology [phone]      ANTICOAG TODAY: Anticoagulation Summary as of 05/15/2014    INR goal 2.0-3.0  Selected INR 2.10 (05/15/2014)  Next INR check 06/19/2014  Target end date    Indications  Paroxysmal atrial fibrillation [I48.0] Long term (current) use of anticoagulants [Z79.01]      Anticoagulation Episode Summary    INR check location [facility]   Preferred lab    Send INR reminders to [place]   Comments     Anticoagulation Care Providers    Provider Role Specialty Phone number   Lelon Perla, MD  Cardiology [phone]      PATIENT INSTRUCTIONS: Patient Instructions  Patient instructed to take medications as defined in the Anti-coagulation Track section of this encounter.  Patient instructed to take  today's dose.  Patient verbalized understanding of these instructions.       FOLLOW-UP Return in 4 weeks (on 06/12/2014) for Follow up INR at 0900h.  Jorene Guest, III Pharm.D., CACP

## 2014-05-16 NOTE — Progress Notes (Signed)
I have reviewed Dr. Gladstone Pih note.  [name] is on anticoagulation for Afib.

## 2014-05-18 NOTE — Telephone Encounter (Signed)
Thank you for this follow up Cindee Salt.

## 2014-05-19 ENCOUNTER — Encounter: Payer: Self-pay | Admitting: Internal Medicine

## 2014-05-19 ENCOUNTER — Ambulatory Visit (INDEPENDENT_AMBULATORY_CARE_PROVIDER_SITE_OTHER): Payer: PPO | Admitting: Internal Medicine

## 2014-05-19 VITALS — BP 142/60 | HR 75 | Temp 98.0°F | Wt 192.6 lb

## 2014-05-19 DIAGNOSIS — F17200 Nicotine dependence, unspecified, uncomplicated: Secondary | ICD-10-CM

## 2014-05-19 DIAGNOSIS — F329 Major depressive disorder, single episode, unspecified: Secondary | ICD-10-CM

## 2014-05-19 DIAGNOSIS — Z794 Long term (current) use of insulin: Secondary | ICD-10-CM | POA: Diagnosis not present

## 2014-05-19 DIAGNOSIS — Z7901 Long term (current) use of anticoagulants: Secondary | ICD-10-CM

## 2014-05-19 DIAGNOSIS — E1140 Type 2 diabetes mellitus with diabetic neuropathy, unspecified: Secondary | ICD-10-CM

## 2014-05-19 DIAGNOSIS — Z9981 Dependence on supplemental oxygen: Secondary | ICD-10-CM

## 2014-05-19 DIAGNOSIS — Z7951 Long term (current) use of inhaled steroids: Secondary | ICD-10-CM

## 2014-05-19 DIAGNOSIS — F339 Major depressive disorder, recurrent, unspecified: Secondary | ICD-10-CM | POA: Diagnosis not present

## 2014-05-19 DIAGNOSIS — E785 Hyperlipidemia, unspecified: Secondary | ICD-10-CM

## 2014-05-19 DIAGNOSIS — M79675 Pain in left toe(s): Secondary | ICD-10-CM | POA: Insufficient documentation

## 2014-05-19 DIAGNOSIS — K219 Gastro-esophageal reflux disease without esophagitis: Secondary | ICD-10-CM

## 2014-05-19 DIAGNOSIS — D369 Benign neoplasm, unspecified site: Secondary | ICD-10-CM

## 2014-05-19 DIAGNOSIS — M79674 Pain in right toe(s): Secondary | ICD-10-CM | POA: Insufficient documentation

## 2014-05-19 DIAGNOSIS — M810 Age-related osteoporosis without current pathological fracture: Secondary | ICD-10-CM

## 2014-05-19 DIAGNOSIS — Z6835 Body mass index (BMI) 35.0-35.9, adult: Secondary | ICD-10-CM

## 2014-05-19 DIAGNOSIS — I480 Paroxysmal atrial fibrillation: Secondary | ICD-10-CM

## 2014-05-19 DIAGNOSIS — M545 Low back pain, unspecified: Secondary | ICD-10-CM

## 2014-05-19 DIAGNOSIS — G8929 Other chronic pain: Secondary | ICD-10-CM

## 2014-05-19 DIAGNOSIS — I872 Venous insufficiency (chronic) (peripheral): Secondary | ICD-10-CM

## 2014-05-19 DIAGNOSIS — E669 Obesity, unspecified: Secondary | ICD-10-CM

## 2014-05-19 DIAGNOSIS — J439 Emphysema, unspecified: Secondary | ICD-10-CM

## 2014-05-19 DIAGNOSIS — Z720 Tobacco use: Secondary | ICD-10-CM

## 2014-05-19 DIAGNOSIS — R195 Other fecal abnormalities: Secondary | ICD-10-CM | POA: Insufficient documentation

## 2014-05-19 LAB — GLUCOSE, CAPILLARY: Glucose-Capillary: 145 mg/dL — ABNORMAL HIGH (ref 70–99)

## 2014-05-19 LAB — POCT GLYCOSYLATED HEMOGLOBIN (HGB A1C): Hemoglobin A1C: 6.5

## 2014-05-19 MED ORDER — RANITIDINE HCL 300 MG PO CAPS: 300.0000 mg | ORAL_CAPSULE | Freq: Every evening | ORAL | Status: DC

## 2014-05-19 NOTE — Assessment & Plan Note (Signed)
Her depression persists despite the Zoloft at 200 mg by mouth daily. We have tried other SSRIs in the past with no more success. We recently switched her back to the Zoloft because of the soft stools that she was having on the Paxil. Unfortunately, her stools have not changed in consistency with this change. This is addressed below. For the depression and anxiety we will continue with the Zoloft at 200 mg by mouth daily and the Wellbutrin for the depression at 100 mg by mouth twice daily. We will reassess the control of her depression at the follow-up visit. She may be a candidate for psychotherapy and I will bring this possibility up at the return visit if her symptoms persist.

## 2014-05-19 NOTE — Assessment & Plan Note (Signed)
A recent DEXA scan demonstrated stability in her severe osteoporosis. This is on calcium and vitamin D supplementation. She is not interested in restarting a bisphosphonate at this time.

## 2014-05-19 NOTE — Assessment & Plan Note (Signed)
Her chronic back pain is stable on the hydrocodone-acetaminophen 5-325 mg 1-2 tablets every 6 hours as needed. She is been limiting the as needed Vicodin and does not require a refill at this time. That being said, we will continue with the hydrocodone acetaminophen and refill when required.

## 2014-05-19 NOTE — Progress Notes (Signed)
Subjective:    Patient ID: [name], female    DOB: [birth year], 65 y.o.   MRN: [id number]  HPI  Please see the A&P for the status of the pt's chronic medical problems.  Review of Systems  Constitutional: Negative for fever, activity change and appetite change.  Eyes: Positive for redness. Negative for photophobia, pain, discharge, itching and visual disturbance.  Respiratory: Positive for shortness of breath.   Cardiovascular: Positive for chest pain. Negative for leg swelling.  Gastrointestinal: Positive for nausea and diarrhea. Negative for vomiting, abdominal pain, constipation and abdominal distention.  Musculoskeletal: Positive for back pain, arthralgias and gait problem. Negative for myalgias and joint swelling.  Skin: Negative for color change, rash and wound.  Psychiatric/Behavioral: Positive for dysphoric mood.      Objective:   Physical Exam  Constitutional: She is oriented to person, place, and time. She appears well-developed and well-nourished. No distress.  HENT:  Head: Normocephalic and atraumatic.  Eyes: Lids are normal. Right eye exhibits no discharge and no exudate. Left eye exhibits no discharge. Right conjunctiva has a hemorrhage. No scleral icterus.    Cardiovascular: Normal rate, regular rhythm and normal heart sounds.   Pulmonary/Chest: Effort normal and breath sounds normal. No respiratory distress. She has no wheezes. She has no rales.  Abdominal: Soft. Bowel sounds are normal. She exhibits no distension. There is no tenderness. There is no rebound and no guarding.  Musculoskeletal: Normal range of motion. She exhibits no edema or tenderness.  Neurological: She is alert and oriented to person, place, and time. She exhibits normal muscle tone.  Skin: Skin is warm and dry. No rash noted. She is not diaphoretic. No erythema.  Psychiatric: She has a normal mood and affect. Her behavior is normal. Judgment and thought content normal.  Nursing note and  vitals reviewed.     Assessment & Plan:   Please see Problem Based Charting.

## 2014-05-19 NOTE — Assessment & Plan Note (Signed)
She is tolerating a high intensity statin well without any myopathy. We will continue the rosuvastatin at 20 mg by mouth at bedtime. The most recent guidelines do not recommend repeating a lipid panel at this point.

## 2014-05-19 NOTE — Assessment & Plan Note (Signed)
She has had several weeks of right great toe pain of unclear etiology. She feels this toe pain was related to her most recent fall. She denies any trauma to the toe prior to this fall and has had no swelling, erythema, or warmth to the area. The pain is described as sharp and constant. It is made worse with walking. Examination of the toe was unremarkable without any swelling, erythema, or skin breakdown. She has yet to try as needed Vicodin for the pain and I encouraged her to do so. We will reassess our ability to control the pain on the Vicodin at the follow-up visit.

## 2014-05-19 NOTE — Assessment & Plan Note (Signed)
She is without any evidence of lower extremity edema on exam today. We will continue with the Lasix therapy.

## 2014-05-19 NOTE — Assessment & Plan Note (Signed)
She is now 3 years out from her most recent colonoscopy which excised a 4 mm adenomatous polyp. At the next visit we will address whether or not she wants to have another surveillance colonoscopy in the near future given her other comorbidities.

## 2014-05-19 NOTE — Assessment & Plan Note (Signed)
She continues to have soft stool and can have up to 5 bowel movements a day. She finds this distressing. We have tried various changes in her medication regimen regarding medications that could cause loose stools but these changes have been ineffective in improving her situation. She has also stopped all stool softeners and constipation medications. At this point all I have to offer are efforts to increase the fiber within the stool. She will restart flaxseeds which she was on previously to see if this helps with the consistency of her stool more moving forward.  This will be reassessed at the follow-up visit.

## 2014-05-19 NOTE — Patient Instructions (Signed)
It is great to see you as always.  I am sorry your stomach is still acting up but your diabetes and weight are much better.  1) Start the flax seed back up to see if we can bulk up your stools.  2) Start ranitidine 300 mg by mouth each night in addition to the omeprazole in the morning for your reflux symptoms.  We will also try to get prior authorization for twice daily omeprazole.  3) Keep taking all of the other medications that you are.  4) We will continue to work on your smoking and weight.  5) Keep up the great work with your diabetes.  6) Try the as needed Vicodin for the toe pain.  I will see you in 3 months, sooner if necessary.

## 2014-05-19 NOTE — Assessment & Plan Note (Signed)
She continues to smoke and is not mentally ready to quit at this time. We will discuss this issue at the follow-up visit.

## 2014-05-19 NOTE — Assessment & Plan Note (Signed)
Her pulmonary symptoms are stable on the Advair, Spiriva, albuterol, and home oxygen. We will continue with this therapy.

## 2014-05-19 NOTE — Assessment & Plan Note (Signed)
She has lost another 6 pounds in the last 3 months. This has resulted in a BMI that is just above 35. She was congratulated on this weight loss and encouraged to continue to watch portion sizes. We will reassess the success of further weight loss and movement into class I obesity at the follow-up visit in 3 months.

## 2014-05-19 NOTE — Assessment & Plan Note (Signed)
Her diabetes is well-controlled today with a hemoglobin A1c of 6.5. Review of her glucometer reveals that over the last month she's had 79 readings with an average sugar of 166, 30% of which have been above target and 70% have been within target. Her low was 88 and her high was 263. This is on Levemir 45 units twice daily and NovoLog 18 units plus a sliding scale with meals 3 times daily. She was congratulated on her excellent diabetic control was encouraged to continue the current regimen at the current doses. She is also up-to-date on her diabetic health maintenance. We will reassess her diabetic control in 3 months with a repeat hemoglobin A1c.  Her diabetic neuropathy is stable symptomatically on the gabapentin 300 mg by mouth twice daily. We will continue with this regimen.

## 2014-05-19 NOTE — Assessment & Plan Note (Signed)
She is up-to-date on her health care maintenance.

## 2014-05-19 NOTE — Assessment & Plan Note (Signed)
She continues with the warfarin anticoagulation for her paroxysmal atrial fibrillation. She will continue to follow in the anticoagulation clinic to assure she remains in the therapeutic range.

## 2014-05-19 NOTE — Assessment & Plan Note (Signed)
Over the last several weeks she's had increasing difficulties with her acid reflux symptoms. She traces this back to an insurance change occurring last year that would only allow her to use PPI therapy once daily. When she was taking the omeprazole 40 mg twice daily her symptoms were well controlled. We will continue with the omeprazole 40 mg by mouth once daily and add ranitidine 300 mg at night. If she continues to have symptoms, I will submit a prescription for omeprazole 40 mg by mouth twice daily and proceed with the prior authorization paperwork. We will reassess her symptomatic control on the PPI and H2 blocker at the follow-up visit.

## 2014-05-23 ENCOUNTER — Other Ambulatory Visit: Payer: Self-pay | Admitting: Internal Medicine

## 2014-05-23 DIAGNOSIS — J301 Allergic rhinitis due to pollen: Secondary | ICD-10-CM

## 2014-05-23 DIAGNOSIS — J439 Emphysema, unspecified: Secondary | ICD-10-CM

## 2014-05-23 DIAGNOSIS — F419 Anxiety disorder, unspecified: Secondary | ICD-10-CM

## 2014-05-23 NOTE — Telephone Encounter (Signed)
Called to pharm 

## 2014-06-19 ENCOUNTER — Ambulatory Visit: Payer: PPO

## 2014-06-23 ENCOUNTER — Telehealth: Payer: Self-pay | Admitting: Dietician

## 2014-06-23 NOTE — Telephone Encounter (Addendum)
assisted patient with pros and cons of meter options preferred by her new insurance company.

## 2014-06-26 ENCOUNTER — Telehealth: Payer: Self-pay | Admitting: *Deleted

## 2014-06-26 ENCOUNTER — Ambulatory Visit: Payer: PPO

## 2014-06-26 NOTE — Telephone Encounter (Signed)
Pt called - problems with heartburn - states was bad 06/25/14. Also diarrhea since 12/2013. Tried to ask pt how often - no answer. Problems with transportation. Appt made 06/28/14 10:15AM Dr Sherrine Maples. Hilda Blades Ditzler RN 06/26/14 9:42AM

## 2014-06-27 ENCOUNTER — Telehealth: Payer: Self-pay | Admitting: Internal Medicine

## 2014-06-27 NOTE — Telephone Encounter (Signed)
Call to patient to confirm appointment for 06/28/14 at 10:15 lmtcb

## 2014-06-28 ENCOUNTER — Telehealth: Payer: Self-pay | Admitting: Pharmacist

## 2014-06-28 ENCOUNTER — Ambulatory Visit (INDEPENDENT_AMBULATORY_CARE_PROVIDER_SITE_OTHER): Payer: PPO | Admitting: Internal Medicine

## 2014-06-28 ENCOUNTER — Encounter: Payer: Self-pay | Admitting: Internal Medicine

## 2014-06-28 VITALS — BP 136/52 | HR 73 | Temp 97.9°F | Ht 62.0 in | Wt 192.0 lb

## 2014-06-28 DIAGNOSIS — Z7901 Long term (current) use of anticoagulants: Secondary | ICD-10-CM | POA: Diagnosis not present

## 2014-06-28 DIAGNOSIS — F339 Major depressive disorder, recurrent, unspecified: Secondary | ICD-10-CM

## 2014-06-28 DIAGNOSIS — K219 Gastro-esophageal reflux disease without esophagitis: Secondary | ICD-10-CM | POA: Diagnosis not present

## 2014-06-28 DIAGNOSIS — E1140 Type 2 diabetes mellitus with diabetic neuropathy, unspecified: Secondary | ICD-10-CM

## 2014-06-28 DIAGNOSIS — R195 Other fecal abnormalities: Secondary | ICD-10-CM | POA: Diagnosis not present

## 2014-06-28 DIAGNOSIS — I480 Paroxysmal atrial fibrillation: Secondary | ICD-10-CM

## 2014-06-28 DIAGNOSIS — F17210 Nicotine dependence, cigarettes, uncomplicated: Secondary | ICD-10-CM

## 2014-06-28 DIAGNOSIS — F32A Depression, unspecified: Secondary | ICD-10-CM

## 2014-06-28 DIAGNOSIS — F329 Major depressive disorder, single episode, unspecified: Secondary | ICD-10-CM

## 2014-06-28 LAB — POCT INR: INR: 3.2

## 2014-06-28 LAB — GLUCOSE, CAPILLARY: Glucose-Capillary: 165 mg/dL — ABNORMAL HIGH (ref 65–99)

## 2014-06-28 MED ORDER — OMEPRAZOLE 40 MG PO CPDR: 40.0000 mg | DELAYED_RELEASE_CAPSULE | Freq: Two times a day (BID) | ORAL | Status: DC

## 2014-06-28 NOTE — Telephone Encounter (Signed)
Called patient after discussion in hallway of IM [facility] with Dr. Drucilla Schmidt regarding INR today while she was seeing the patient (3.2) on 2.29m (1/2 x 57m 5 days of week; 1x5m46mn two days of week. See documentation. After discussion with Dr. MalSherrine Maples concur upon her clinical decision to reduce the patients dosing to 5mg69m ONE day of week, and 1/2x5mg 15m5mg) 67mall other days (so, a dose reduction from 22.5mg/we53mto 20mg/we34motal summed dose). Patient provided read-back and cited her understanding of these instructions as had been given by Dr. Mallory.Sherrine Maplest cited an inability to come to clinic on the RTC date I suggested (as requested by Dr. Mallory)[name] 1, 2016 citing: " I will not have received my "check" for the month and will not be able to pay the copay.".  I asked her if she could come on my normally scheduled anticoagulation management day ([facility]) and she cited:  "It's my sister's wedding anniversary--she is my transportation". [facility] and she agreed to come at 0930h that morning.

## 2014-06-29 ENCOUNTER — Encounter: Payer: Self-pay | Admitting: Licensed Clinical Social Worker

## 2014-06-29 NOTE — Progress Notes (Signed)
Patient ID: [name], female   DOB: 01-31-1951, 64 y.o.   MRN: [id number] [place] referral faxed to [facility].

## 2014-06-30 NOTE — Assessment & Plan Note (Signed)
Patient continues to have acid reflux symptoms. Unfortunately, the addition of ranitidine 300 mg at night to her 40 mg omeprazole daily has nor improved her symptoms.   - Submitted prescription for omeprazole 40 mg twice daily and appreciate Kaye's help with the prior authorization - Discontinued ranitidine

## 2014-06-30 NOTE — Assessment & Plan Note (Signed)
She follows in coumadin clinic, but missed her most recent appointment. INR checked today in clinic and found to be supratherapeutic at 3.2 (goal 2-3).   - Dosing changed to 2.5 mg six days per week and 5 mg once per week - INR check in 2 weeks  Addendum: approved by Dr. Elie Confer, who called patient to re-state plan

## 2014-06-30 NOTE — Patient Instructions (Addendum)
You received a referral for psychotherapy today; this is to help with your depression and anxiety in combination with your medications.  Your INR was elevated to 3.2 today. Since the goal is for that number to be between 2-3, we will make a change to your warfarin dosing:  Please take 2.5 mg (1/2 of one pill) six days per week and 5 mg (a full pill) once per week. Please attend your next INR check in 2 weeks. Dr. Elie Confer will call you to confirm all of this.  Your omeprazole dose has been increased to 40 mg twice per day. It may take a day or two for insurance clearance on this. STOP taking the ranitidine.  Please try flaxseed for your loose stools.  General Instructions:   Please bring your medicines with you each time you come to clinic.  Medicines may include prescription medications, over-the-counter medications, herbal remedies, eye drops, vitamins, or other pills.   Progress Toward Treatment Goals:  Treatment Goal 05/19/2014  Hemoglobin A1C at goal  Blood pressure -  Stop smoking smoking the same amount  Prevent falls -    Self Care Goals & Plans:  Self Care Goal 06/28/2014  Manage my medications take my medicines as prescribed; bring my medications to every visit; refill my medications on time  Monitor my health bring my glucose meter and log to each visit; keep track of my blood pressure; bring my blood pressure log to each visit  Eat healthy foods eat more vegetables; eat foods that are low in salt; eat baked foods instead of fried foods  Be physically active -  Stop smoking (No Data)  Prevent falls -  Meeting treatment goals -    Home Blood Glucose Monitoring 12/19/2013  Check my blood sugar 4 times a day  When to check my blood sugar before meals     Care Management & Community Referrals:  Referral 05/19/2014  Referrals made for care management support none needed  Referrals made to community resources none

## 2014-06-30 NOTE — Progress Notes (Signed)
Internal Medicine Clinic Attending  Case discussed with Dr. Sherrine Maples soon after the resident saw the patient.  We reviewed the resident's history and exam and pertinent patient test results.  I agree with the assessment, diagnosis, and plan of care documented in the resident's note.

## 2014-06-30 NOTE — Progress Notes (Addendum)
[facility] Subjective:   Patient ID: [name] female   DOB: 01/18/1951 64 y.o.   MRN: [id number]  HPI: [name] is a 64 y.o. female with a history of COPD (on 2 L O2 by [place]), OSA, multiple cardiac comorbidities (pulmonary hypertension, carotid artery stenosis, mitral stenosis, paroxysmal atrial fibrillation and CHF with LV diastolic dysfunction), hyperlipidemia, tobacco abuse, anxiety, depression, fibromyalgia, osteoporosis, arthritis, clear cell renal carcinoma, anemia of chronic disease, GERD and chronic loose stools who presents for evaluation of her heartburn and loose stools.  Her heartburn has been worse than it is at baseline for the past 5 days. She describes her pain as burning and worse after eating. These symptoms were previously controlled when she was taking omeprazole 40 mg BID; however, her insurance changed and her prescription was changed to omeprazole 40 mg daily. When her symptoms worsened at that time, her PCP added ranitidine 300 mg at night. Unfortunately, she has not found this to be effective.   Her loose stools are similar to her loose stools at baseline, but she is finding them distressing. She would rather not eat at the moment, as she feels like anything she ingests "goes right through me". She is having 2-3 stools per day and has not noted any blood in her stool or pain on defecation. This issue has been explored at length by her PCP, who has manipulated her medications in attempt to minimize medication sources; this has not led to any improvement in her symptom. She previously struggled with constipation, but has stopped her stool softeners and constipation medications. Multiple tests for c difficile (2014 and 2015) have been normal. At her last assessment, an attempt at increasing fiber with flaxseed ingestion was proposed. However, the patient has not tried this.   She has a history of anxiety and depression, for which  psychotherapy has been suggested in the past. Her symptoms of indifference and depressed mood are persistent. She is interested in trying psychotherapy now, alongside her medical therapy.  Finally, the patient is on warfarin therapy for her atrial fibrillation but missed her most recent INR check. She has been taking warfarin 2.5 mg five days per week and 5 mg two days per week for a goal INR of 2-3.   Past Medical History  Diagnosis Date  . COPD (chronic obstructive pulmonary disease) with emphysema     PFTs 02/2012: FEV1 0.92 (40%), ratio 69, 27% increase in FEV1 with BD, TLC 91%, severe airtrapping, DLCO49% On chronic home O2. Pulmonary rehab referral 05/2012   . Moderate to severe pulmonary hypertension     2014 TEE w PA peak pressure 46 mmHg, s/p MV replacement   . Tobacco abuse 07/28/2012  . Hyperlipidemia LDL goal < 100 11/20/2005  . Carotid artery stenosis     s/p right endarterectomy (06/2010) Carotid US (07/2010):  Left: Moderate-to-severe (60-79%) calcific and non-calcific plaque origin and proximal ICA and ECA   . Mitral stenosis     s/p Mitral valve replacement with a 27-mm pericardial porcine valve (Medtronic Mosaic valve, serial #[id number] on 09/20/10, Dr. Prescott Gum)   . Chronic congestive heart failure with left ventricular diastolic dysfunction 2/58/5277  . Depression 11/19/2005  . Anxiety 07/24/2010  . Fibromyalgia 08/29/2010  . Obesity, Class II, BMI 35-39.9 10/23/2011  . Adenomatous polyps 05/14/2011    Colonoscopy (05/2011): 4 mm adenomatous polyp excised endoscopically Colonoscopy (02/2002): Adenomatous polyp excised endoscopically   . Gastroesophageal reflux disease   . Internal hemorrhoids  08/04/2012  . Osteoporosis     DEXA (12/09/2011): L-spine T -3.7, left hip T -1.4 DEXA (12/2004): L-spine T -2.6, left hip -0.1   . Allergic rhinitis 06/01/2012  . Bilateral cataracts 08/04/2012    Visually insignificant   . Chronic constipation 02/03/2011  . Chronic venous insufficiency  08/04/2012  . Paroxysmal atrial fibrillation 10/22/2010    s/p Left atrial maze procedure for paroxysmal atrial fibrillation on 09/20/2010 by Dr Prescott Gum.  Subsequent splenic infarct, decision was made to re-anticoagulate with coumadin, likely life-long as this is the most likely cause of the splenic infarct.   . Chronic low back pain 10/06/2012  . Anemia of chronic disease 01/01/2013  . Exertional shortness of breath   . Obstructive sleep apnea     Nocturnal polysomnography (06/2009): Moderate sleep apnea/ hypopnea syndrome , AHI 17.8 per hour with nonpositional hypopneas. CPAP titration to 12 CWP, AHI 2.4 per hour. On nocturnal CPAP via a small resMed Quattro full-face mask with heated humidifier.   . On home oxygen therapy     "2L when I sleep; day or night; when I leave the house" (01/16/2014)  . Type 2 diabetes mellitus with diabetic neuropathy   . History of blood transfusion     "several times"   . H/O hiatal hernia   . Asthmatic bronchitis     "hx; don't get it anymore" (01/16/2014)  . Pneumonia     "twice" (01/16/2014)  . Clear cell renal cell carcinoma 07/21/2011    s/p cryoablation of left RCC in 09/2011 by [name]. Followed by [name]  [facility]) .    Marland Kitchen Chronic daily headache 01/16/2014  . Migraine     "used to" (01/16/2014)  . Arthritis     "back and hands" (01/16/2014)   Current Outpatient Prescriptions  Medication Sig Dispense Refill  . albuterol (PROAIR HFA) 108 (90 BASE) MCG/ACT inhaler Inhale 2 puffs into the lungs every 6 (six) hours as needed for wheezing or shortness of breath. 1 Inhaler 11  . ALPRAZolam (XANAX) 1 MG tablet Take 0.5-1 tablets (0.5-1 mg total) by mouth 2 (two) times daily as needed for anxiety. for anxiety 60 tablet 5  . buPROPion (WELLBUTRIN) 100 MG tablet Take 1 tablet (100 mg total) by mouth 2 (two) times daily. 180 tablet 3  . Calcium Carb-Cholecalciferol 600-800 MG-UNIT TABS Take 1 tablet by mouth 2 (two) times daily.     .  chlorpheniramine (CHLOR-TRIMETON) 4 MG tablet Take 1 tablet (4 mg total) by mouth 3 (three) times daily as needed for allergies. 90 tablet 11  . EASY TOUCH PEN NEEDLES 31G X 5 MM MISC USE TO INJECT INSULIN 5 TIMES DAILY 200 each 8  . ferrous gluconate (FERGON) 324 MG tablet Take 1 tablet (324 mg total) by mouth 2 (two) times daily with a meal. 60 tablet 3  . fluconazole (DIFLUCAN) 150 MG tablet Take 1 tablet (150 mg total) by mouth once. (Patient not taking: Reported on 05/19/2014) 1 tablet 3  . fluticasone (FLONASE) 50 MCG/ACT nasal spray Place 2 sprays into both nostrils daily. 16 g 3  . Fluticasone-Salmeterol (ADVAIR DISKUS) 250-50 MCG/DOSE AEPB Inhale 1 puff into the lungs 2 (two) times daily. 180 each 3  . furosemide (LASIX) 80 MG tablet Take 1 tablet (80 mg total) by mouth 2 (two) times daily. 180 tablet 3  . gabapentin (NEURONTIN) 300 MG capsule Take 1 capsule (300 mg total) by mouth 2 (two) times daily. 180 capsule 3  . HYDROcodone-acetaminophen (  NORCO/VICODIN) 5-325 MG per tablet Take 1-2 tablets by mouth every 6 (six) hours as needed for severe pain. 180 tablet 0  . insulin aspart (NOVOLOG) cartridge Inject 18 Units into the skin 3 (three) times daily with meals. (Patient taking differently: Inject 18 Units into the skin 3 (three) times daily with meals. 18 units plus sliding scale) 30 mL 11  . Insulin Detemir (LEVEMIR) 100 UNIT/ML Pen Inject 45 Units into the skin 2 (two) times daily. 15 mL 11  . Lancets Misc. (ACCU-CHEK FASTCLIX LANCET) KIT Check your blood 4 times a day dx code 250.00 insulin requiring 1 kit 2  . nystatin cream (MYCOSTATIN) Apply 1 application topically 2 (two) times daily. (Patient not taking: Reported on 05/19/2014) 30 g 11  . omeprazole (PRILOSEC) 40 MG capsule Take 1 capsule (40 mg total) by mouth 2 (two) times daily. 30 capsule 1  . potassium chloride SA (K-DUR,KLOR-CON) 20 MEQ tablet Take 1 tablet (20 mEq total) by mouth 2 (two) times daily. 180 tablet 3  . promethazine  (PHENERGAN) 12.5 MG tablet Take 1 tablet (12.5 mg total) by mouth every 6 (six) hours as needed for nausea or vomiting. 30 tablet 11  . rosuvastatin (CRESTOR) 20 MG tablet Take 1 tablet (20 mg total) by mouth daily. (Patient taking differently: Take 20 mg by mouth at bedtime. ) 90 tablet 3  . sertraline (ZOLOFT) 100 MG tablet Take 2 tablets (200 mg total) by mouth daily. 180 tablet 3  . tiotropium (SPIRIVA HANDIHALER) 18 MCG inhalation capsule Place 1 capsule (18 mcg total) into inhaler and inhale daily. 90 capsule 3  . warfarin (COUMADIN) 5 MG tablet Take 2.5-5 mg by mouth daily at 6 PM. 2.5 mg on everyday except on Monday ( 5 mg )     No current facility-administered medications for this visit.   Family History  Problem Relation Age of Onset  . Peptic Ulcer Disease Father   . Heart attack Father 36    Died of MI at age 39  . Heart attack Brother 47    Died of MI at age 24  . Obesity Brother   . Pneumonia Mother   . Healthy Sister   . Lupus Daughter   . Obsessive Compulsive Disorder Daughter    History   Social History  . Marital Status: Divorced    Spouse Name: N/A  . Number of Children: N/A  . Years of Education: N/A   Social History Main Topics  . Smoking status: Current Every Day Smoker -- 1.50 packs/day for 49 years    Types: Cigarettes    Start date: 10/31/2013    Last Attempt to Quit: 10/31/2013  . Smokeless tobacco: Never Used     Comment: 1 1/2  PPD  . Alcohol Use: No  . Drug Use: No  . Sexual Activity: No   Other Topics Concern  . None   Social History Narrative   Lives alone in [place] ([place]) with 60 pound chow   Worked at [employer] for 18 years   No car   Review of Systems: General: no recent illness, no sick contacts Skin: no rashes or lesions HEENT: no headache, history of bilateral cataracts Cardiac: no chest pain or palpiations Respiratory: on 2 L O2 by [place], no changes GI: heartburn, loose stools Urinary: no changes Msk:  periodic joint pain, no swelling Psychiatric: history of anxiety and depression, feels indifferent, depressed   Objective:  Physical Exam: Filed Vitals:   06/28/14 1026  BP: 136/52  Pulse: 73  Temp: 97.9 F (36.6 C)  TempSrc: Oral  Height: 5' 2" (1.575 m)  Weight: 192 lb (87.091 kg)  SpO2: 98%   Appearance: in NAD, sitting in wheelchair with 2L O2 by [place] in place, pleasant and talkative, weight is stable HEENT: AT/[place], PERRL, EOMi, no lymphadenopathy Heart: RRR, normal S1S2 Lungs: CTAB, no wheezes or rales Abdomen: BS+, soft, nontender throughout Musculoskeletal: no pain on passive range of motion, no edema Neurologic: A&Ox3, grossly intact Skin: no rashes or lesions Psychiatric: depressed mood   Assessment & Plan:  Case discussed with Dr. Lynnae January  Gastroesophageal reflux disease Patient continues to have acid reflux symptoms. Unfortunately, the addition of ranitidine 300 mg at night to her 40 mg omeprazole daily has nor improved her symptoms.   - Submitted prescription for omeprazole 40 mg twice daily and appreciate Kaye's help with the prior authorization - Discontinued ranitidine   Loose stools Patient continues to have loose stools, though she reports that they have decreased to about 2-3 per day. She has not yet tried flaxseed, which was suggested at her last appointment.  - Patient to add flaxseed to her coffee   Major depression, chronic Patient has been managing her depression with Zoloft 200 mg by mouth daily and Wellbutrin 100 mg by mouth twice daily. She continues to have depressed mood and indifference. Psychotherapy had been proposed at her last visit, and she would likely benefit from combination therapy (medical management and psychotherapy).  - Continue Zoloft and Wellbutrin as prescribed - Referral for [facility]   Paroxysmal atrial fibrillation She follows in coumadin clinic, but missed her most recent appointment. INR checked today in  clinic and found to be supratherapeutic at 3.2 (goal 2-3).   - Dosing changed to 2.5 mg six days per week and 5 mg once per week - INR check in 2 weeks  Addendum: approved by Dr. Elie Confer, who called patient to re-state plan     Medications Ordered Meds ordered this encounter  Medications  . omeprazole (PRILOSEC) 40 MG capsule    Sig: Take 1 capsule (40 mg total) by mouth 2 (two) times daily.    Dispense:  30 capsule    Refill:  1   Other Orders Orders Placed This Encounter  Procedures  . Glucose, capillary  . Ambulatory referral to Behavioral Health    Referral Priority:  Routine    Referral Type:  Psychiatric    Referral Reason:  Specialty Services Required    Requested Specialty:  Behavioral Health    Number of Visits Requested:  1  . POCT INR

## 2014-06-30 NOTE — Assessment & Plan Note (Signed)
Patient has been managing her depression with Zoloft 200 mg by mouth daily and Wellbutrin 100 mg by mouth twice daily. She continues to have depressed mood and indifference. Psychotherapy had been proposed at her last visit, and she would likely benefit from combination therapy (medical management and psychotherapy).  - Continue Zoloft and Wellbutrin as prescribed - Referral for [facility]

## 2014-06-30 NOTE — Assessment & Plan Note (Signed)
Patient continues to have loose stools, though she reports that they have decreased to about 2-3 per day. She has not yet tried flaxseed, which was suggested at her last appointment.  - Patient to add flaxseed to her coffee

## 2014-07-12 IMAGING — CT CT ABD-PELV W/ CM
1 of 3 series · 13 of 32 positions shown, 18 images · IV contrast (APPLIED)
Comparison: CT of the abdomen and performed 10/23/2011, and
abdominal ultrasound performed 07/01/2011

CLINICAL DATA: Cough, quadrant abdominal pain.  History of renal
cancer.  The

CT ABDOMEN AND PELVIS WITH CONTRAST
TECHNIQUE: Multidetector CT imaging of the abdomen and pelvis was
performed following the standard protocol during bolus
administration of intravenous contrast.
Contrast: 100mL OMNIPAQUE IOHEXOL 300 MG/ML  SOLN

[Series 2: abd/pelv with 5.0 b31f st · axial · 0.87mm/px · z∈[-433,-3]mm · 13 of 96 slices shown, 18 images]
[im 5/96  soft-tissue]
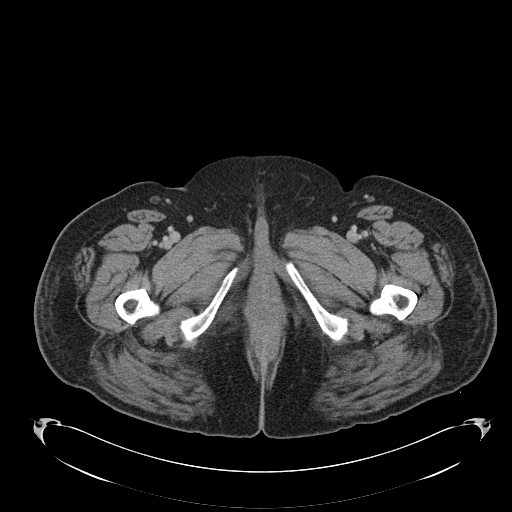
[im 5/96  bone]
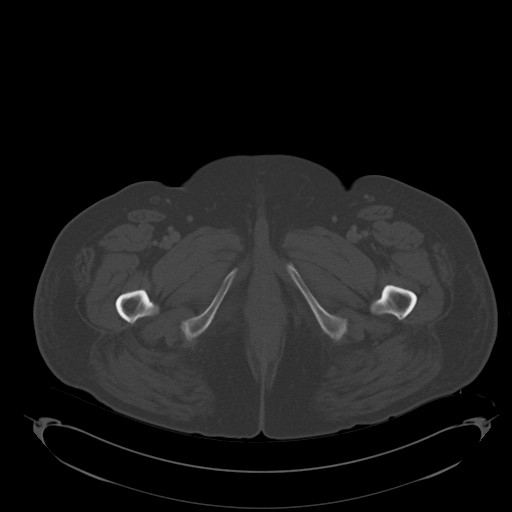
[im 15/96  soft-tissue]
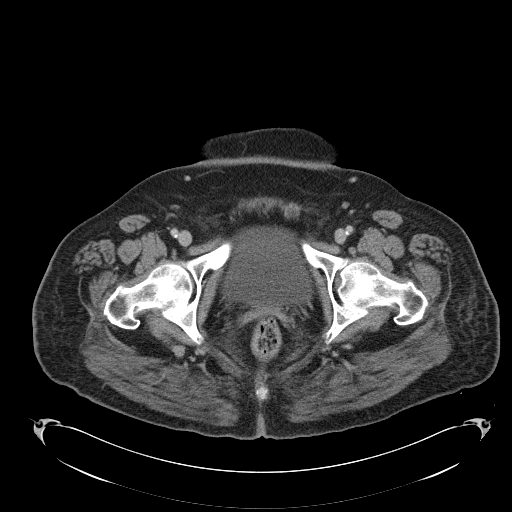
[im 20/96  soft-tissue]
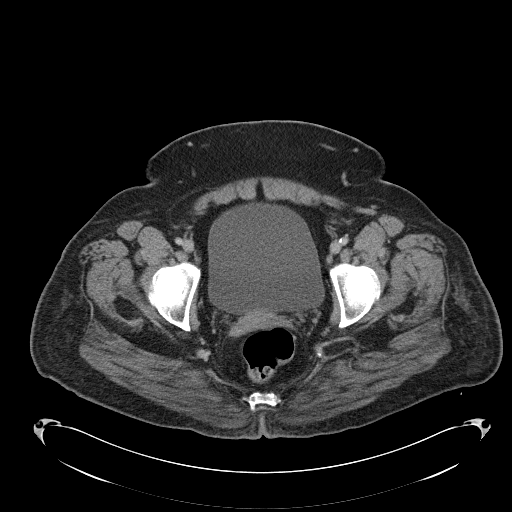
[im 29/96  soft-tissue]
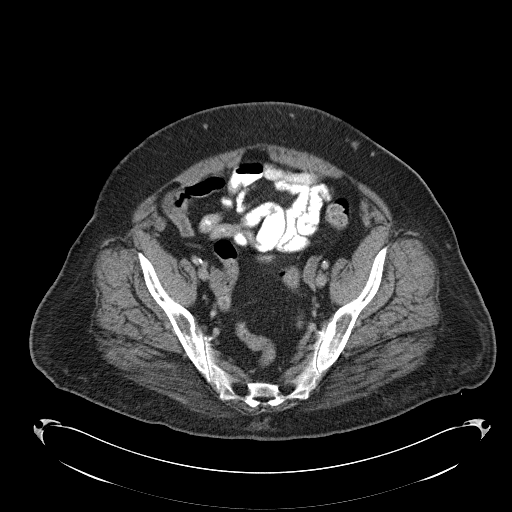
[im 39/96  soft-tissue]
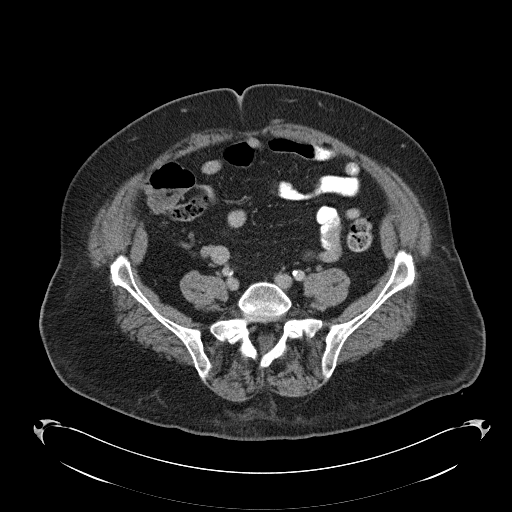
[im 43/96  soft-tissue]
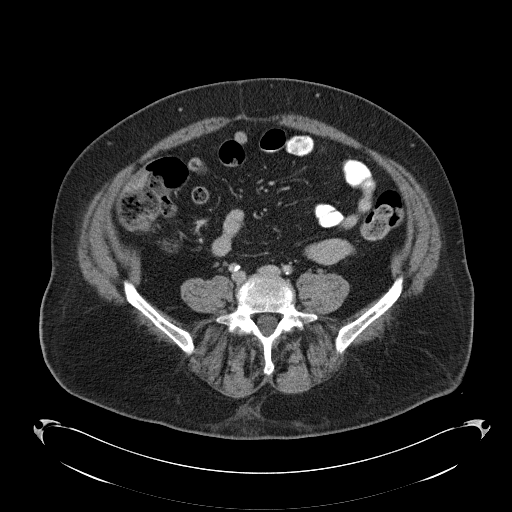
[im 53/96  soft-tissue]
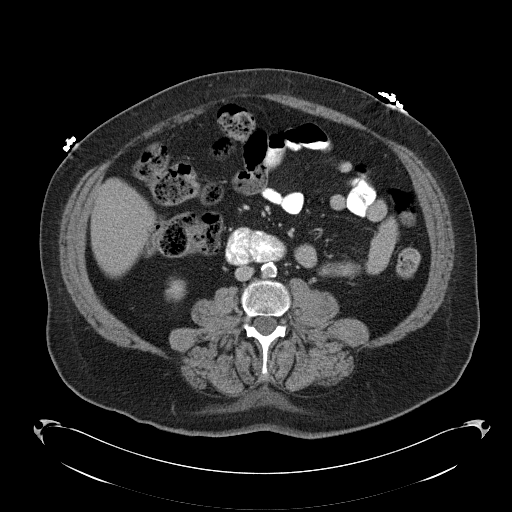
[im 58/96  soft-tissue]
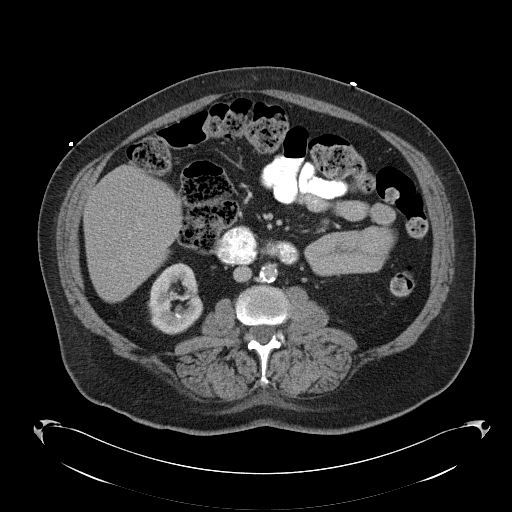
[im 67/96  soft-tissue]
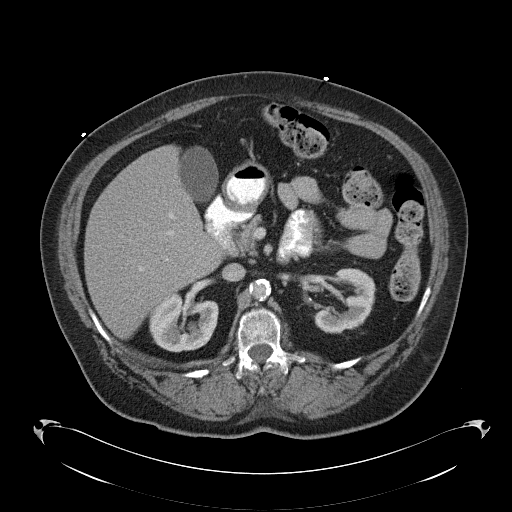
[im 67/96  bone]
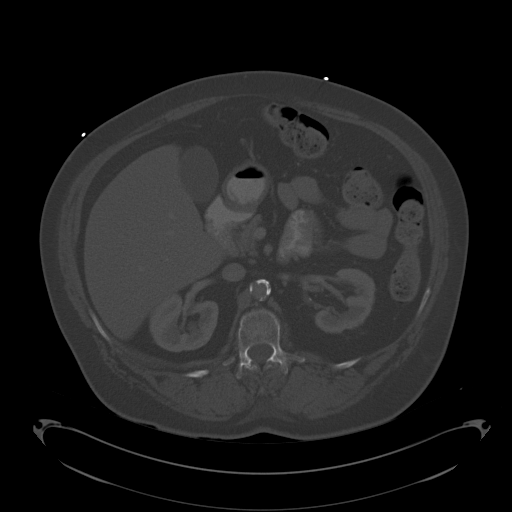
[im 77/96  soft-tissue]
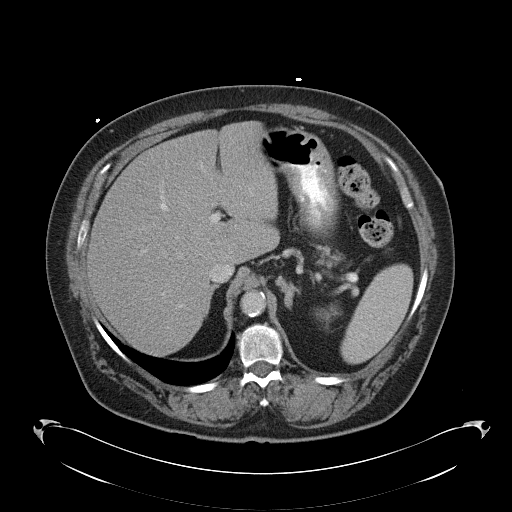
[im 77/96  lung]
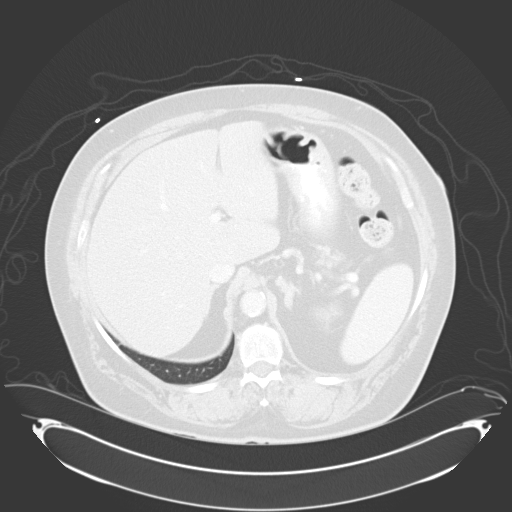
[im 81/96  soft-tissue]
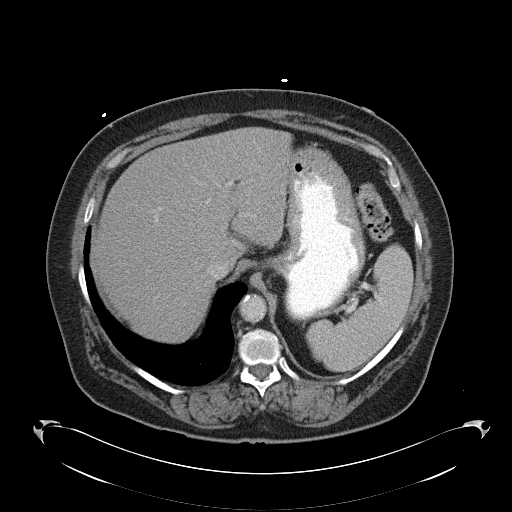
[im 81/96  lung]
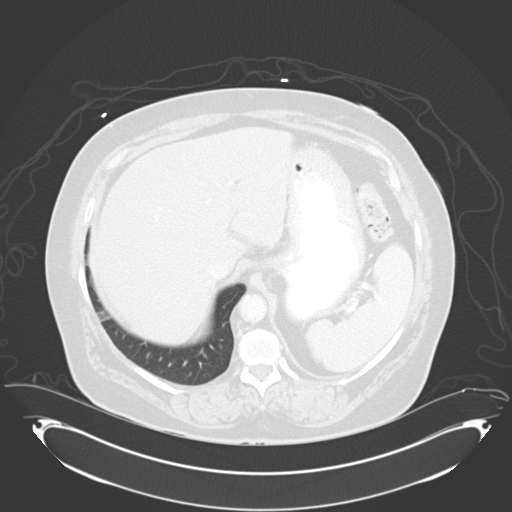
[im 86/96  lung]
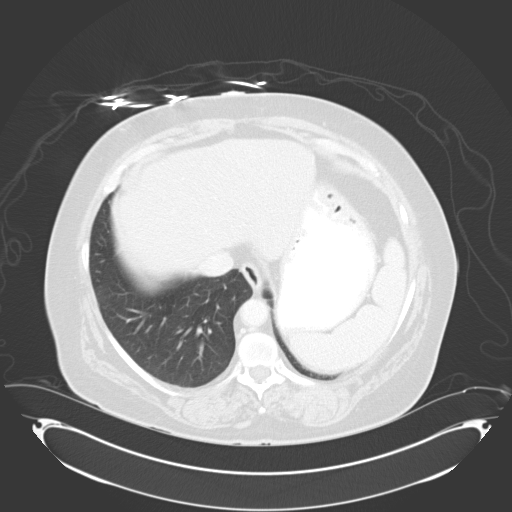
[im 91/96  soft-tissue]
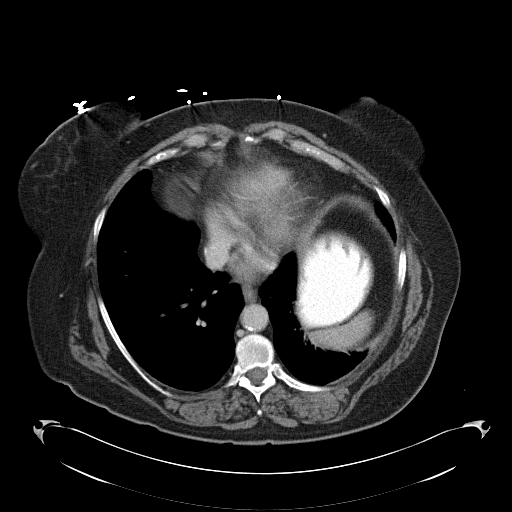
[im 91/96  lung]
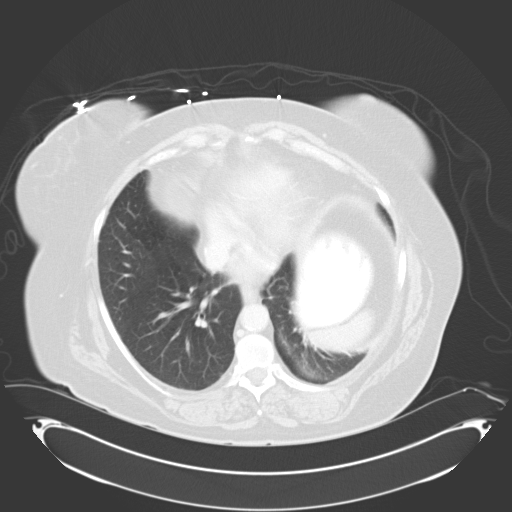

[13 of 32 positions shown; findings below may reference images not displayed]

FINDINGS: Minimal left basilar atelectasis is noted.  Scattered
calcification is noted at the mitral valve.

The liver and spleen are unremarkable in appearance.  The
gallbladder is within normal limits.  The pancreas and adrenal
glands are unremarkable.

A 5 mm stone is noted near the lower pole of the right kidney.
There is marked scarring at the lower pole of the left kidney, with
changes of prior cryoablation again seen.  This has retracted
significantly since the prior CT.  Mild perinephric stranding is
noted about the left kidney.  There is no evidence of
hydronephrosis.  No obstructing ureteral stones are seen.

No free fluid is identified.  The small bowel is unremarkable in
appearance.  The stomach is within normal limits.  No acute
vascular abnormalities are seen.  Relatively diffuse calcification
is noted along the abdominal aorta and its branches.

The appendix is normal in caliber, without evidence for
appendicitis.  The colon is unremarkable in appearance.

The bladder is moderately distended and grossly unremarkable in
appearance.  The uterus is within normal limits.  The ovaries are
relatively symmetric; no suspicious adnexal masses are seen.  No
inguinal lymphadenopathy is seen.

No acute osseous abnormalities are identified.
IMPRESSION: 1.  No acute abnormality seen within the abdomen or pelvis.
2.  Postoperative changes noted at the lower pole of the left
kidney; changes of cryoablation have retracted significantly since
the prior study.  No evidence of recurrence of malignancy.
3.  5 mm stone noted at the lower pole of the right kidney.  No
evidence of hydronephrosis; no obstructing ureteral stones seen.
4.  Relatively diffuse calcification along the abdominal aorta and
its branches.

## 2014-07-18 ENCOUNTER — Telehealth: Payer: Self-pay | Admitting: Pharmacist

## 2014-07-18 NOTE — Telephone Encounter (Signed)
Call to patient to confirm appointment for 07/19/14 at 10:00 lmtcb

## 2014-07-19 ENCOUNTER — Ambulatory Visit (INDEPENDENT_AMBULATORY_CARE_PROVIDER_SITE_OTHER): Payer: PPO | Admitting: Pharmacist

## 2014-07-19 ENCOUNTER — Other Ambulatory Visit: Payer: Self-pay | Admitting: Internal Medicine

## 2014-07-19 DIAGNOSIS — Z7901 Long term (current) use of anticoagulants: Secondary | ICD-10-CM | POA: Diagnosis not present

## 2014-07-19 DIAGNOSIS — I480 Paroxysmal atrial fibrillation: Secondary | ICD-10-CM | POA: Diagnosis not present

## 2014-07-19 DIAGNOSIS — I5032 Chronic diastolic (congestive) heart failure: Secondary | ICD-10-CM

## 2014-07-19 LAB — POCT INR: INR: 2.5

## 2014-07-19 NOTE — Progress Notes (Signed)
Anti-Coagulation Progress Note  STEPHANYE FINNICUM is a 65 y.o. female who is currently on an anti-coagulation regimen.    RECENT RESULTS: Recent results are below, the most recent result is correlated with a dose of 20 mg. per week: Lab Results  Component Value Date   INR 2.50 07/19/2014   INR 3.2 06/28/2014   INR 2.10 05/15/2014    ANTI-COAG DOSE: Anticoagulation Dose Instructions as of 07/19/2014      [name] Tue Wed Thu Fri Sat   New Dose 2.5 mg 5 mg 2.5 mg 2.5 mg 2.5 mg 2.5 mg 2.5 mg    Description        Patient requests specifically a 5 week interval for next appointment citing finances and her ability to pay.        ANTICOAG SUMMARY: Anticoagulation Episode Summary    Current INR goal 2.0-3.0  Next INR check 08/21/2014  INR from last check 2.50 (07/19/2014)  Weekly max dose   Target end date   [facility] check location [facility]  Preferred lab   Send INR reminders to [place]   Indications  Paroxysmal atrial fibrillation [I48.0] Long term (current) use of anticoagulants [Z79.01]        Comments       Anticoagulation Care Providers    Provider Role Specialty Phone number   Lelon Perla, MD  Cardiology [phone]      ANTICOAG TODAY: Anticoagulation Summary as of 07/19/2014    INR goal 2.0-3.0  Selected INR 2.50 (07/19/2014)  Next INR check 08/21/2014  Target end date    Indications  Paroxysmal atrial fibrillation [I48.0] Long term (current) use of anticoagulants [Z79.01]      Anticoagulation Episode Summary    INR check location [facility]   Preferred lab    Send INR reminders to [facility]   Comments     Anticoagulation Care Providers    Provider Role Specialty Phone number   Lelon Perla, MD  Cardiology [phone]      PATIENT INSTRUCTIONS: Patient Instructions  Patient instructed to take medications as defined in the Anti-coagulation Track section of this encounter.  Patient instructed to  take today's dose.  Patient verbalized understanding of these instructions.       FOLLOW-UP Return in 5 weeks (on 08/21/2014) for Follow up  INR at 0945h.  Jorene Guest, III Pharm.D., CACP

## 2014-07-19 NOTE — Patient Instructions (Signed)
Patient instructed to take medications as defined in the Anti-coagulation Track section of this encounter.  Patient instructed to take today's dose.  Patient verbalized understanding of these instructions.

## 2014-07-20 NOTE — Progress Notes (Signed)
Patient ID: [name], female   DOB: 1950/10/01, 64 y.o.   MRN: [id number] 07/20/14: Telephone call to follow up that [place] had received pt's referral.  CSW placed called to [facility].  CSW left message requesting return call. CSW provided contact hours and phone number.

## 2014-07-21 NOTE — Progress Notes (Signed)
Patient ID: [name], female   DOB: May 16, 1950, 64 y.o.   MRN: [id number]  07/20/14 - CSW received message from Vian at [facility] requesting CSW to refax referral, as it was not received. 07/21/14 - CSW re-faxed referral to [facility], attention Sunday Spillers

## 2014-07-24 ENCOUNTER — Other Ambulatory Visit: Payer: Self-pay | Admitting: Dietician

## 2014-07-24 DIAGNOSIS — E1140 Type 2 diabetes mellitus with diabetic neuropathy, unspecified: Secondary | ICD-10-CM

## 2014-07-24 NOTE — Telephone Encounter (Signed)
Patient calls trying to get testing supplies: [employer] referred her to [facility]( which is changing names from  [email or web address]) . She needs an rx for a new meter, and 3 months of test strips with as many refills as desired for the  [employer] meter  Faxed to [employer] order/[facility] at [employer] [phone]

## 2014-07-25 MED ORDER — ONETOUCH VERIO W/DEVICE KIT: 1.0000 | PACK | Freq: Four times a day (QID) | Status: DC

## 2014-07-25 MED ORDER — ONETOUCH VERIO VI STRP: ORAL_STRIP | Status: DC

## 2014-07-26 NOTE — Progress Notes (Signed)
Patient ID: [name], female   DOB: 26-Jul-1950, 64 y.o.   MRN: [id number] CSW confirmed pt's referral has been received by [facility] and has been contacted by them.  CSW placed call to [name] on 07/26/14 to follow up on referral and if pt's is still receptive to behavioral health services.  Pt states "I will.  I just don't know when.  It's a money issue."  Referral was made to an in-network provider, but would be under specialist copayment of $30.

## 2014-07-31 ENCOUNTER — Other Ambulatory Visit: Payer: Self-pay | Admitting: *Deleted

## 2014-07-31 ENCOUNTER — Other Ambulatory Visit (HOSPITAL_COMMUNITY): Payer: Self-pay | Admitting: Interventional Radiology

## 2014-07-31 DIAGNOSIS — C642 Malignant neoplasm of left kidney, except renal pelvis: Secondary | ICD-10-CM

## 2014-07-31 DIAGNOSIS — R52 Pain, unspecified: Secondary | ICD-10-CM

## 2014-08-01 ENCOUNTER — Telehealth: Payer: Self-pay | Admitting: Internal Medicine

## 2014-08-01 NOTE — Telephone Encounter (Signed)
Pt is calling to see if her other doctors office called in to request [place] take some labs for the patient before her ct scan next week. Patient is not sure what the exact labs were said she spoke with helen and never received a call back about it.

## 2014-08-01 NOTE — Telephone Encounter (Signed)
Talked to pt - will come in AM for labwork for CT scan. Lab is aware and orders are in from [facility] xray. Hilda Blades Ditzler RN 08/01/14 2:50PM

## 2014-08-02 ENCOUNTER — Other Ambulatory Visit: Payer: PPO

## 2014-08-09 ENCOUNTER — Other Ambulatory Visit (HOSPITAL_COMMUNITY): Payer: PPO

## 2014-08-09 ENCOUNTER — Other Ambulatory Visit: Payer: PPO

## 2014-08-17 ENCOUNTER — Other Ambulatory Visit: Payer: Self-pay | Admitting: Internal Medicine

## 2014-08-21 ENCOUNTER — Other Ambulatory Visit (INDEPENDENT_AMBULATORY_CARE_PROVIDER_SITE_OTHER): Payer: PPO

## 2014-08-21 ENCOUNTER — Ambulatory Visit (INDEPENDENT_AMBULATORY_CARE_PROVIDER_SITE_OTHER): Payer: PPO | Admitting: Pharmacist

## 2014-08-21 DIAGNOSIS — C642 Malignant neoplasm of left kidney, except renal pelvis: Secondary | ICD-10-CM | POA: Diagnosis not present

## 2014-08-21 DIAGNOSIS — I480 Paroxysmal atrial fibrillation: Secondary | ICD-10-CM

## 2014-08-21 DIAGNOSIS — Z7901 Long term (current) use of anticoagulants: Secondary | ICD-10-CM | POA: Diagnosis not present

## 2014-08-21 LAB — BASIC METABOLIC PANEL WITH GFR
BUN: 11 mg/dL (ref 6–23)
CO2: 29 mEq/L (ref 19–32)
Calcium: 9.1 mg/dL (ref 8.4–10.5)
Chloride: 98 mEq/L (ref 96–112)
Creat: 0.91 mg/dL (ref 0.50–1.10)
GFR, Est African American: 77 mL/min
GFR, Est Non African American: 66 mL/min
Glucose, Bld: 140 mg/dL — ABNORMAL HIGH (ref 70–99)
Potassium: 3.4 mEq/L — ABNORMAL LOW (ref 3.5–5.3)
Sodium: 136 mEq/L (ref 135–145)

## 2014-08-21 LAB — POCT INR: INR: 2.5

## 2014-08-21 NOTE — Patient Instructions (Signed)
Patient instructed to take medications as defined in the Anti-coagulation Track section of this encounter.  Patient instructed to take today's dose.  Patient verbalized understanding of these instructions.    

## 2014-08-21 NOTE — Progress Notes (Signed)
Anti-Coagulation Progress Note  [name] is a 64 y.o. female who is currently on an anti-coagulation regimen.    RECENT RESULTS: Recent results are below, the most recent result is correlated with a dose of 20 mg. per week: Lab Results  Component Value Date   INR 2.50 08/21/2014   INR 2.50 07/19/2014   INR 3.2 06/28/2014    ANTI-COAG DOSE: Anticoagulation Dose Instructions as of 08/21/2014      [name] Tue Wed Thu Fri Sat   New Dose 2.5 mg 5 mg 2.5 mg 2.5 mg 2.5 mg 2.5 mg 2.5 mg    Description        Patient requests specifically a 5 week interval for next appointment citing finances and her ability to pay.        ANTICOAG SUMMARY: Anticoagulation Episode Summary    Current INR goal 2.0-3.0  Next INR check 09/25/2014  INR from last check 2.50 (08/21/2014)  Weekly max dose   Target end date   [facility]  Preferred lab   Send INR reminders to [place]   Indications  Paroxysmal atrial fibrillation [I48.0] Long term (current) use of anticoagulants [Z79.01]        Comments       Anticoagulation Care Providers    Provider Role Specialty Phone number   Lelon Perla, MD  Cardiology [phone]      ANTICOAG TODAY: Anticoagulation Summary as of 08/21/2014    INR goal 2.0-3.0  Selected INR 2.50 (08/21/2014)  Next INR check 09/25/2014  Target end date    Indications  Paroxysmal atrial fibrillation [I48.0] Long term (current) use of anticoagulants [Z79.01]      Anticoagulation Episode Summary    INR check [facility]   Preferred lab    Send INR reminders to [place]   Comments     Anticoagulation Care Providers    Provider Role Specialty Phone number   Lelon Perla, MD  Cardiology [phone]      PATIENT INSTRUCTIONS: Patient Instructions  Patient instructed to take medications as defined in the Anti-coagulation Track section of this encounter.  Patient instructed  to take today's dose.  Patient verbalized understanding of these instructions.        FOLLOW-UP Return in 5 weeks (on 09/25/2014) for Follow up INR at 1000h.  Jorene Guest, III Pharm.D., CACP

## 2014-08-22 ENCOUNTER — Other Ambulatory Visit: Payer: Self-pay | Admitting: Internal Medicine

## 2014-08-22 DIAGNOSIS — E1140 Type 2 diabetes mellitus with diabetic neuropathy, unspecified: Secondary | ICD-10-CM

## 2014-08-22 DIAGNOSIS — B373 Candidiasis of vulva and vagina: Secondary | ICD-10-CM

## 2014-08-22 DIAGNOSIS — B3731 Acute candidiasis of vulva and vagina: Secondary | ICD-10-CM

## 2014-09-05 ENCOUNTER — Encounter (HOSPITAL_COMMUNITY): Payer: Self-pay

## 2014-09-05 ENCOUNTER — Ambulatory Visit (HOSPITAL_COMMUNITY)
Admission: RE | Admit: 2014-09-05 | Discharge: 2014-09-05 | Disposition: A | Discharge status: Home / Self Care | Payer: PPO | Source: Ambulatory Visit | Attending: Interventional Radiology | Admitting: Interventional Radiology

## 2014-09-05 ENCOUNTER — Ambulatory Visit
Admission: RE | Admit: 2014-09-05 | Discharge: 2014-09-05 | Disposition: A | Discharge status: Home / Self Care | Payer: PPO | Source: Ambulatory Visit | Attending: Interventional Radiology | Admitting: Interventional Radiology

## 2014-09-05 DIAGNOSIS — I700 Atherosclerosis of aorta: Secondary | ICD-10-CM | POA: Diagnosis not present

## 2014-09-05 DIAGNOSIS — N200 Calculus of kidney: Secondary | ICD-10-CM | POA: Diagnosis not present

## 2014-09-05 DIAGNOSIS — C642 Malignant neoplasm of left kidney, except renal pelvis: Secondary | ICD-10-CM | POA: Diagnosis not present

## 2014-09-05 DIAGNOSIS — R52 Pain, unspecified: Secondary | ICD-10-CM

## 2014-09-05 HISTORY — DX: Calculus of kidney: N20.0

## 2014-09-05 MED ORDER — IOHEXOL 300 MG/ML  SOLN
100.0000 mL | Freq: Once | INTRAMUSCULAR | Status: AC | PRN
  Administered 2014-09-05: 100 mL via INTRAVENOUS

## 2014-09-05 NOTE — Progress Notes (Signed)
Chief Complaint  Patient presents with  . Follow-up    3 yr follow up Cryoablation of Left Renal Carcinoma   History of Present Illness: [name] is a 64 y.o. female status post percutaneous cryoablation of a left clear cell renal carcinoma on 09/19/2011. She has been doing well with no significant illnesses. COPD has been stable. She has had some periodic back pain.  Past Medical History  Diagnosis Date  . COPD (chronic obstructive pulmonary disease) with emphysema     PFTs 02/2012: FEV1 0.92 (40%), ratio 69, 27% increase in FEV1 with BD, TLC 91%, severe airtrapping, DLCO49% On chronic home O2. Pulmonary rehab referral 05/2012   . Moderate to severe pulmonary hypertension     2014 TEE w PA peak pressure 46 mmHg, s/p MV replacement   . Tobacco abuse 07/28/2012  . Hyperlipidemia LDL goal < 100 11/20/2005  . Carotid artery stenosis     s/p right endarterectomy (06/2010) Carotid US (07/2010):  Left: Moderate-to-severe (60-79%) calcific and non-calcific plaque origin and proximal ICA and ECA   . Mitral stenosis     s/p Mitral valve replacement with a 27-mm pericardial porcine valve (Medtronic Mosaic valve, serial #[id number] on 09/20/10, Dr. Prescott Gum)   . Chronic congestive heart failure with left ventricular diastolic dysfunction 2/33/6122  . Depression 11/19/2005  . Anxiety 07/24/2010  . Fibromyalgia 08/29/2010  . Obesity, Class II, BMI 35-39.9 10/23/2011  . Adenomatous polyps 05/14/2011    Colonoscopy (05/2011): 4 mm adenomatous polyp excised endoscopically Colonoscopy (02/2002): Adenomatous polyp excised endoscopically   . Gastroesophageal reflux disease   . Internal hemorrhoids 08/04/2012  . Osteoporosis     DEXA (12/09/2011): L-spine T -3.7, left hip T -1.4 DEXA (12/2004): L-spine T -2.6, left hip -0.1   . Allergic rhinitis 06/01/2012  . Bilateral cataracts 08/04/2012    Visually insignificant   . Chronic constipation 02/03/2011  . Chronic venous insufficiency 08/04/2012  .  Paroxysmal atrial fibrillation 10/22/2010    s/p Left atrial maze procedure for paroxysmal atrial fibrillation on 09/20/2010 by Dr Prescott Gum.  Subsequent splenic infarct, decision was made to re-anticoagulate with coumadin, likely life-long as this is the most likely cause of the splenic infarct.   . Chronic low back pain 10/06/2012  . Anemia of chronic disease 01/01/2013  . Exertional shortness of breath   . Obstructive sleep apnea     Nocturnal polysomnography (06/2009): Moderate sleep apnea/ hypopnea syndrome , AHI 17.8 per hour with nonpositional hypopneas. CPAP titration to 12 CWP, AHI 2.4 per hour. On nocturnal CPAP via a small resMed Quattro full-face mask with heated humidifier.   . On home oxygen therapy     "2L when I sleep; day or night; when I leave the house" (01/16/2014)  . Type 2 diabetes mellitus with diabetic neuropathy   . History of blood transfusion     "several times"   . H/O hiatal hernia   . Asthmatic bronchitis     "hx; don't get it anymore" (01/16/2014)  . Pneumonia     "twice" (01/16/2014)  . Clear cell renal cell carcinoma 07/21/2011    s/p cryoablation of left RCC in 09/2011 by [name]. Followed by [name]  [facility]) .    Marland Kitchen Chronic daily headache 01/16/2014  . Migraine     "used to" (01/16/2014)  . Arthritis     "back and hands" (01/16/2014)    Past Surgical History  Procedure Laterality Date  . Orif clavicle fracture  01/2004  by Thana Farr [name], M.D for Right clavicle nonunion.  . Tonsillectomy    . Carotid endarterectomy Right 07/04/2010    by [name] for asymptomatic right carotid artery stenosis  . Lipoma excision  08/2005    occipital lipoma 1.5cm - by Dr. [name]  . Hysteroscopy with endometrial ablation  06/2001    for persistent post-menopausal bleeding // by S. [name], M.D.  . Mitral valve replacement  09/20/10     with a 27-mm pericardial porcine valve (Medtronic Mosaic valve, serial #[id number]). 09/20/10, Dr Prescott Gum  .  Maze Left 09/20/10    for paroxysmal atrial fibrillation (Dr. Prescott Gum)  . Chest tube insertion  09/24/2010    Dr Prescott Gum  . Cardiac valve replacement  Aug. 2012    "mitral valve"  . Colonoscopy  05/12/2011    performed by Dr. Michail Sermon. Showing small internal hemorrhoids, single tubular adenoma polyp  . Esophagogastroduodenoscopy  05/12/2011    performed by Dr. Michail Sermon. Negative for ulcerations, biopsy negative for evidence of celiac sprue  . Tubal ligation    . Eye surgery    . Fracture surgery Right 2005    clavicle  . Dilation and curettage of uterus    . Cryoablation Left 09/2011    by [name]. Followed by [name]  [facility]) .      Allergies: Lorazepam; Morphine and related; Oxycontin; and Tramadol hcl  Medications: Prior to Admission medications   Medication Sig Start Date End Date Taking? Authorizing Provider  albuterol (PROAIR HFA) 108 (90 BASE) MCG/ACT inhaler Inhale 2 puffs into the lungs every 6 (six) hours as needed for wheezing or shortness of breath. 01/11/14   [name], MD  ALPRAZolam [name]) 1 MG tablet Take 0.5-1 tablets (0.5-1 mg total) by mouth 2 (two) times daily as needed for anxiety. for anxiety 05/23/14   [name], MD  Blood Glucose Monitoring Suppl (ONETOUCH VERIO) W/DEVICE KIT 1 each by Does not apply route 4 (four) times daily. 07/25/14   [name], MD  buPROPion (WELLBUTRIN) 100 MG tablet Take 1 tablet (100 mg total) by mouth 2 (two) times daily. 02/17/14   [name], MD  Calcium Carb-Cholecalciferol 600-800 MG-UNIT TABS Take 1 tablet by mouth 2 (two) times daily.  08/04/12   [name], MD  chlorpheniramine (CHLOR-TRIMETON) 4 MG tablet Take 1 tablet (4 mg total) by mouth 3 (three) times daily as needed for allergies. 10/06/12   [name], MD  EASY TOUCH PEN NEEDLES 31G X 5 MM MISC USE TO INJECT INSULIN 5 TIMES DAILY 08/23/14   [name], MD  ferrous gluconate (FERGON) 324 MG tablet Take 1 tablet (324 mg total) by  mouth 2 (two) times daily with a meal. 01/09/13   Jessee Avers, MD  fluconazole (DIFLUCAN) 150 MG tablet Take 1 tablet (150 mg total) by mouth once. 08/23/14   [name], MD  fluticasone (FLONASE) 50 MCG/ACT nasal spray Place 2 sprays into both nostrils daily. 05/23/14   [name], MD  Fluticasone-Salmeterol (ADVAIR DISKUS) 250-50 MCG/DOSE AEPB Inhale 1 puff into the lungs 2 (two) times daily. 05/23/14   [name], MD  furosemide (LASIX) 80 MG tablet Take 1 tablet (80 mg total) by mouth 2 (two) times daily. 07/19/14   [name], MD  gabapentin (NEURONTIN) 300 MG capsule Take 1 capsule (300 mg total) by mouth 2 (two) times daily. 11/14/13   [name], MD  HYDROcodone-acetaminophen (NORCO/VICODIN) 5-325 MG per tablet Take 1-2 tablets by mouth every  6 (six) hours as needed for severe pain. 02/17/14   [name], MD  insulin aspart (NOVOLOG) cartridge Inject 18 Units into the skin 3 (three) times daily with meals. Patient taking differently: Inject 18 Units into the skin 3 (three) times daily with meals. 18 units plus sliding scale 02/17/14   [name], MD  Insulin Detemir (LEVEMIR) 100 UNIT/ML Pen Inject 45 Units into the skin 2 (two) times daily. 04/05/14   [name], MD  Lancets Misc. (ACCU-CHEK FASTCLIX LANCET) KIT Check your blood 4 times a day dx code 250.00 insulin requiring 01/19/13   [name], MD  nystatin cream (MYCOSTATIN) Apply 1 application topically 2 (two) times daily. 02/17/14   [name], MD  omeprazole (PRILOSEC) 40 MG capsule Take 1 capsule (40 mg total) by mouth 2 (two) times daily. 06/28/14 06/28/15  Karlene Einstein, MD  [facility] VERIO test strip Check blood sugar up to 4 times a day 07/25/14   [name], MD  potassium chloride SA (K-DUR,KLOR-CON) 20 MEQ tablet Take 1 tablet (20 mEq total) by mouth 2 (two) times daily. 11/14/13   [name], MD  promethazine (PHENERGAN) 12.5 MG tablet Take 1 tablet (12.5 mg total) by mouth every 6 (six)  hours as needed for nausea or vomiting. 10/26/13   [name], MD  rosuvastatin (CRESTOR) 20 MG tablet Take 1 tablet (20 mg total) by mouth daily. Patient taking differently: Take 20 mg by mouth at bedtime.  11/14/13   [name], MD  sertraline (ZOLOFT) 100 MG tablet Take 2 tablets (200 mg total) by mouth daily. 02/17/14   [name], MD  tiotropium (SPIRIVA HANDIHALER) 18 MCG inhalation capsule Place 1 capsule (18 mcg total) into inhaler and inhale daily. 11/14/13   [name], MD  warfarin (COUMADIN) 5 MG tablet Take 2.5-5 mg by mouth daily at 6 PM. 2.5 mg on everyday except on Monday ( 5 mg )    Historical Provider, MD     Family History  Problem Relation Age of Onset  . Peptic Ulcer Disease Father   . Heart attack Father 78    Died of MI at age 62  . Heart attack Brother 23    Died of MI at age 42  . Obesity Brother   . Pneumonia Mother   . Healthy Sister   . Lupus Daughter   . Obsessive Compulsive Disorder Daughter     History   Social History  . Marital Status: Divorced    Spouse Name: N/A  . Number of Children: N/A  . Years of Education: N/A   Social History Main Topics  . Smoking status: Current Every Day Smoker -- 1.50 packs/day for 49 years    Types: Cigarettes    Start date: 10/31/2013    Last Attempt to Quit: 10/31/2013  . Smokeless tobacco: Never Used     Comment: 1 1/2  PPD  . Alcohol Use: No  . Drug Use: No  . Sexual Activity: No   Other Topics Concern  . Not on file   Social History Narrative   Lives alone in [place] [facility]) with 60 pound chow   Worked at [employer] for 18 years   No car    Review of Systems: A 12 point ROS discussed and pertinent positives are indicated in the HPI above.  All other systems are negative.  Review of Systems  Constitutional: Negative.   Respiratory: Negative.   Cardiovascular: Negative.   Gastrointestinal: Negative.   Genitourinary:  Negative.   Neurological: Negative.     Vital  Signs: BP 127/53 mmHg  Pulse 91  Temp(Src) 98.2 F (36.8 C) (Oral)  Resp 14  Ht _0  (1.575 m)  Wt 185 lb (83.915 kg)  BMI 33.83 kg/m2  SpO2 98%  Physical Exam  Constitutional: She is oriented to person, place, and time. No distress.  Abdominal: Soft. She exhibits no distension. There is no tenderness. There is no rebound and no guarding.  Neurological: She is alert and oriented to person, place, and time.  Skin: She is not diaphoretic.  Nursing note and vitals reviewed.   Mallampati Score:     Imaging: Ct Abd Wo & W Cm  09/05/2014   CLINICAL DATA:  Renal ablation 3 years prior.  Left renal carcinoma.  EXAM: CT ABDOMEN WITHOUT AND WITH CONTRAST  TECHNIQUE: Multidetector CT imaging of the abdomen was performed following the standard protocol before and following the bolus administration of intravenous contrast.  CONTRAST:  173m OMNIPAQUE IOHEXOL 300 MG/ML  SOLN  COMPARISON:  CT 08/05/ 2015  FINDINGS: Lower chest: Lung bases are clear.  Hepatobiliary: No focal hepatic lesion. No biliary duct dilatation. Gallbladder is normal. Common bile duct is normal.  Pancreas: Pancreas is normal. No ductal dilatation. No pancreatic inflammation.  Spleen: Normal spleen  Adrenals/urinary tract: Mild thickening of the adrenal glands unchanged.  Ablation site posterior to the lower pole of the left kidney is unchanged. There is a stable rounded focus of fatty change at the treatment site which is similar comparison exam. There is soft tissue at the treatment site measuring 2.5 by 1.4 cm which compares to 2.7 by 1.5 cm on CT of 8 06/2013. This tissue demonstrates no significant post-contrast enhancement.  No new lesions within the left kidney. The right kidney has a nonobstructing 5 mm calculus.  Stomach/Bowel: The stomach and limited view of the small bowel and colon are unremarkable.  Vascular/Lymphatic: Abdominal aorta is normal caliber with atherosclerotic calcification. There is no retroperitoneal or  periportal lymphadenopathy.  Musculoskeletal: No aggressive osseous lesion.  Other: No free fluid.  IMPRESSION: 1. Stable cryoablation site along lower pole left kidney without evidence of local recurrence. 2. Small right renal calculus. 3. Atherosclerotic calcification aorta.   Electronically Signed   By: SSuzy BouchardM.D.   On: 09/05/2014 14:24    Labs:  CBC:  Recent Labs  01/16/14 1900 01/17/14 0500 01/18/14 0520 05/10/14 1410  WBC 7.1 6.3 5.9 7.7  HGB 11.3* 10.8* 10.2* 11.7*  HCT 35.0* 32.8* 31.7* 36.5  PLT 199 197 177 191    COAGS:  Recent Labs  01/16/14 1900  05/15/14 1105 06/28/14 1202 07/19/14 1005 08/21/14 1153  INR 1.84*  < > 2.10 3.2 2.50 2.50  APTT 35  --   --   --   --   --   < > = values in this interval not displayed.  BMP:  Recent Labs  01/17/14 0545 01/18/14 0520 05/10/14 1410 08/21/14 1027  NA 136* 137 138 136  K 4.1 4.4 4.2 3.4*  CL 99 103 99 98  CO2 _1 GLUCOSE 216* 239* 216* 140*  BUN _2 CALCIUM 9.1 8.6 9.4 9.1  CREATININE 0.94 0.88 1.09 0.91  GFRNONAA 63* 68* 52* 66  GFRAA 73* 79* 60* 77    LIVER FUNCTION TESTS:  Recent Labs  01/16/14 1900 05/10/14 1410  BILITOT 0.3 0.4  AST 26 22  ALT 31 20  ALKPHOS  48 44  PROT 6.9 5.9*  ALBUMIN 3.8 3.4*    TUMOR MARKERS: No results for input(s): AFPTM, CEA, CA199, CHROMGRNA in the last 8760 hours.  Assessment and Plan:  I met with [name] and reviewed today's follow-up CT imaging findings with her and her daughter. The study shows continued evolution of a left lower pole cryoablation defect without evidence of enhancing residual tumor. No complications are evident. At the site of prior splenic infarction, the spleen demonstrates some focal atrophy. Renal function is stable and normal. I recommended a follow-up CT in one year.    SignedAletta Edouard T 09/05/2014, 4:04 PM     I spent a total of 15 Minutes in face to face in clinical consultation,  greater than 50% of which was counseling/coordinating care post ablation of a left renal carcinoma.

## 2014-09-06 ENCOUNTER — Encounter (HOSPITAL_COMMUNITY): Payer: Self-pay

## 2014-09-06 DIAGNOSIS — N200 Calculus of kidney: Secondary | ICD-10-CM

## 2014-09-06 HISTORY — DX: Calculus of kidney: N20.0

## 2014-09-07 ENCOUNTER — Other Ambulatory Visit: Payer: Self-pay | Admitting: *Deleted

## 2014-09-07 DIAGNOSIS — M545 Low back pain, unspecified: Secondary | ICD-10-CM

## 2014-09-07 DIAGNOSIS — G8929 Other chronic pain: Secondary | ICD-10-CM

## 2014-09-07 NOTE — Telephone Encounter (Signed)
Pt request 3 refills. She will be in clinic on 8/15 Last refill 3/18 Pt is back to taking pain meds TID  Pt # [phone]

## 2014-09-08 ENCOUNTER — Ambulatory Visit: Payer: PPO | Admitting: Internal Medicine

## 2014-09-08 MED ORDER — HYDROCODONE-ACETAMINOPHEN 5-325 MG PO TABS: 1.0000 | ORAL_TABLET | Freq: Four times a day (QID) | ORAL | Status: DC | PRN

## 2014-09-09 IMAGING — CR DG CHEST 2V
1 series · 1 of 1 positions shown · non-contrast
Comparison: 09/15/2011

CLINICAL DATA: Worsening shortness of breath.  Runny nose and chest
congestion.

CHEST - 2 VIEW

[w chest lat]
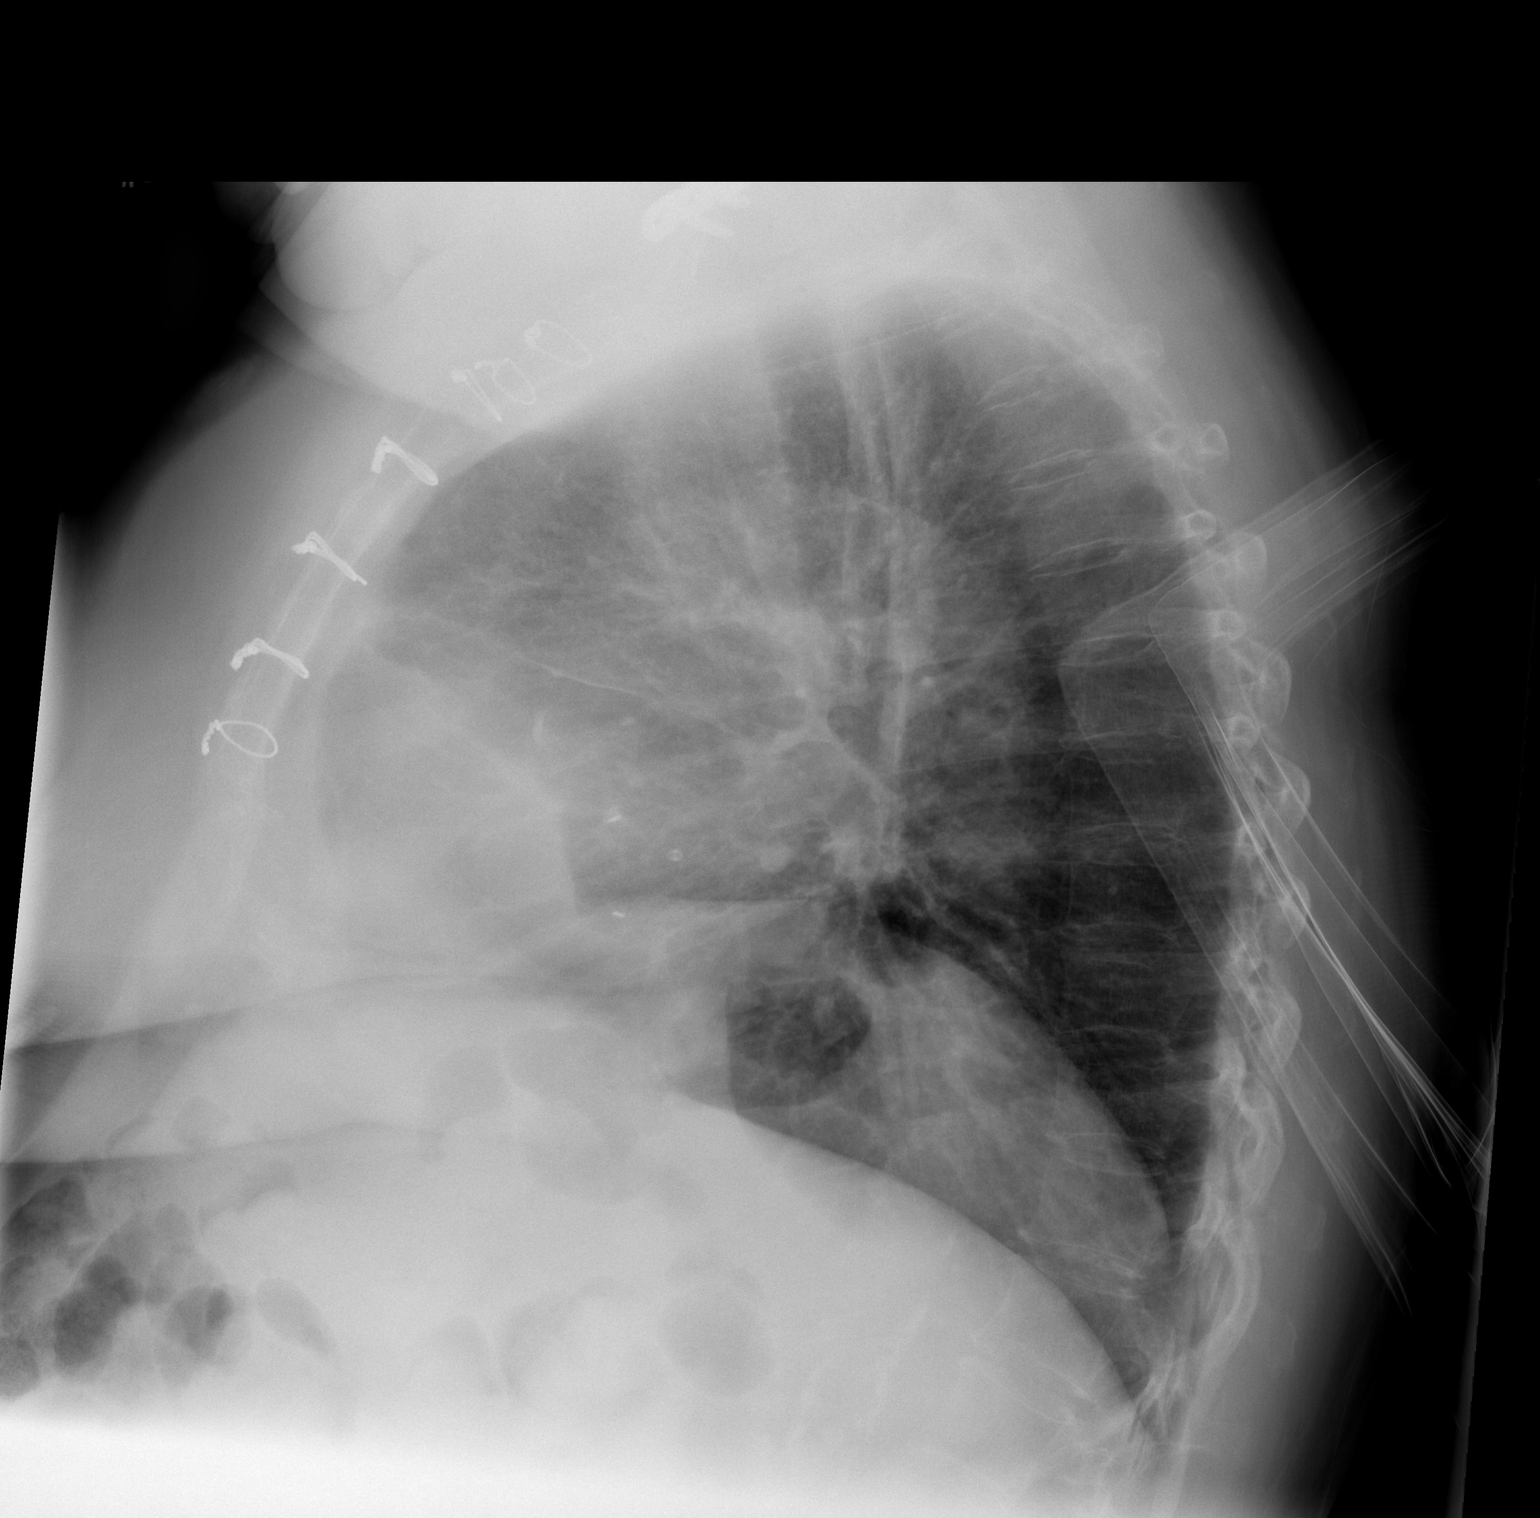

[1 of 1 positions shown; findings below may reference images not displayed]

FINDINGS: Two views of the chest were obtained.  Plate and screw
fixation of the right clavicle is again noted.  Stable elevation of
the left hemidiaphragm.  The lungs are clear.  The patient has
median sternotomy wires.  Heart size is stable and normal.
IMPRESSION: No acute chest findings.

## 2014-09-10 IMAGING — CR DG CHEST 1V PORT
2 series · 2 of 2 positions shown · non-contrast
Comparison: 05/03/2012.

CLINICAL DATA: Respiratory distress.

PORTABLE CHEST - 1 VIEW

[AP (1 of 2)]
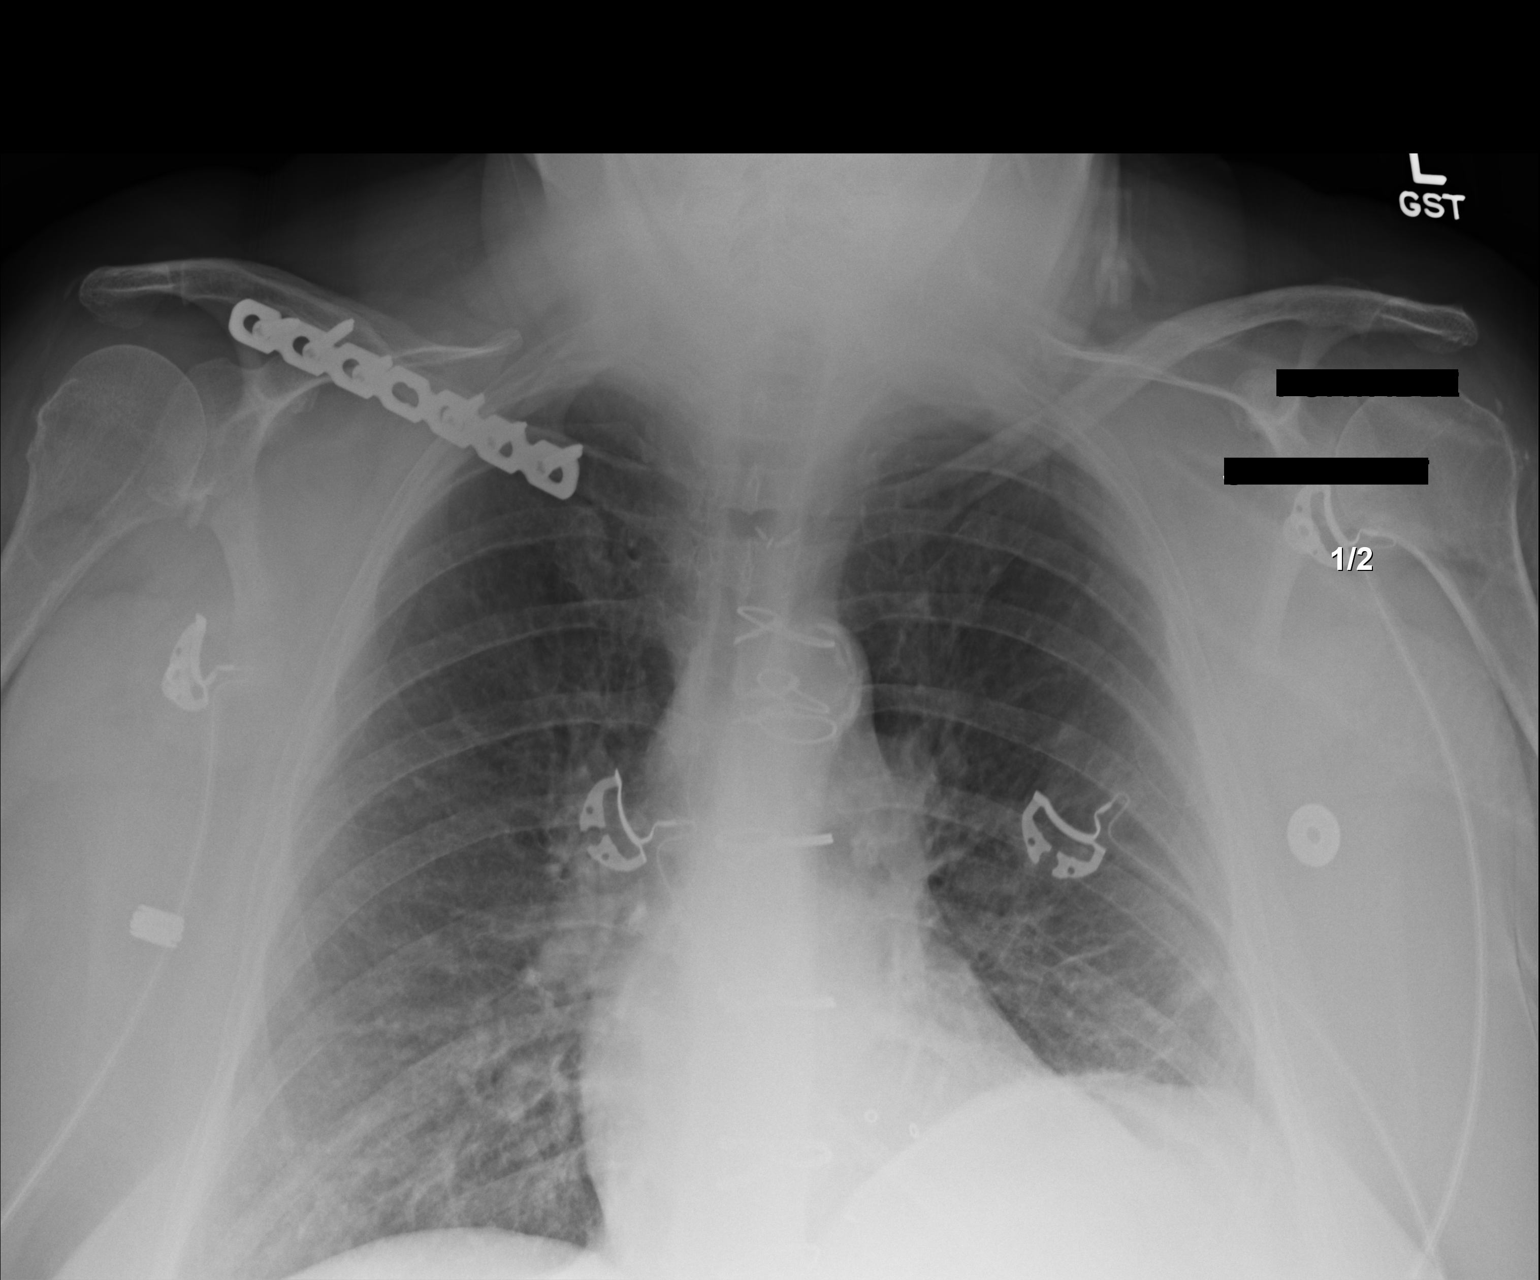

[AP (2 of 2)]
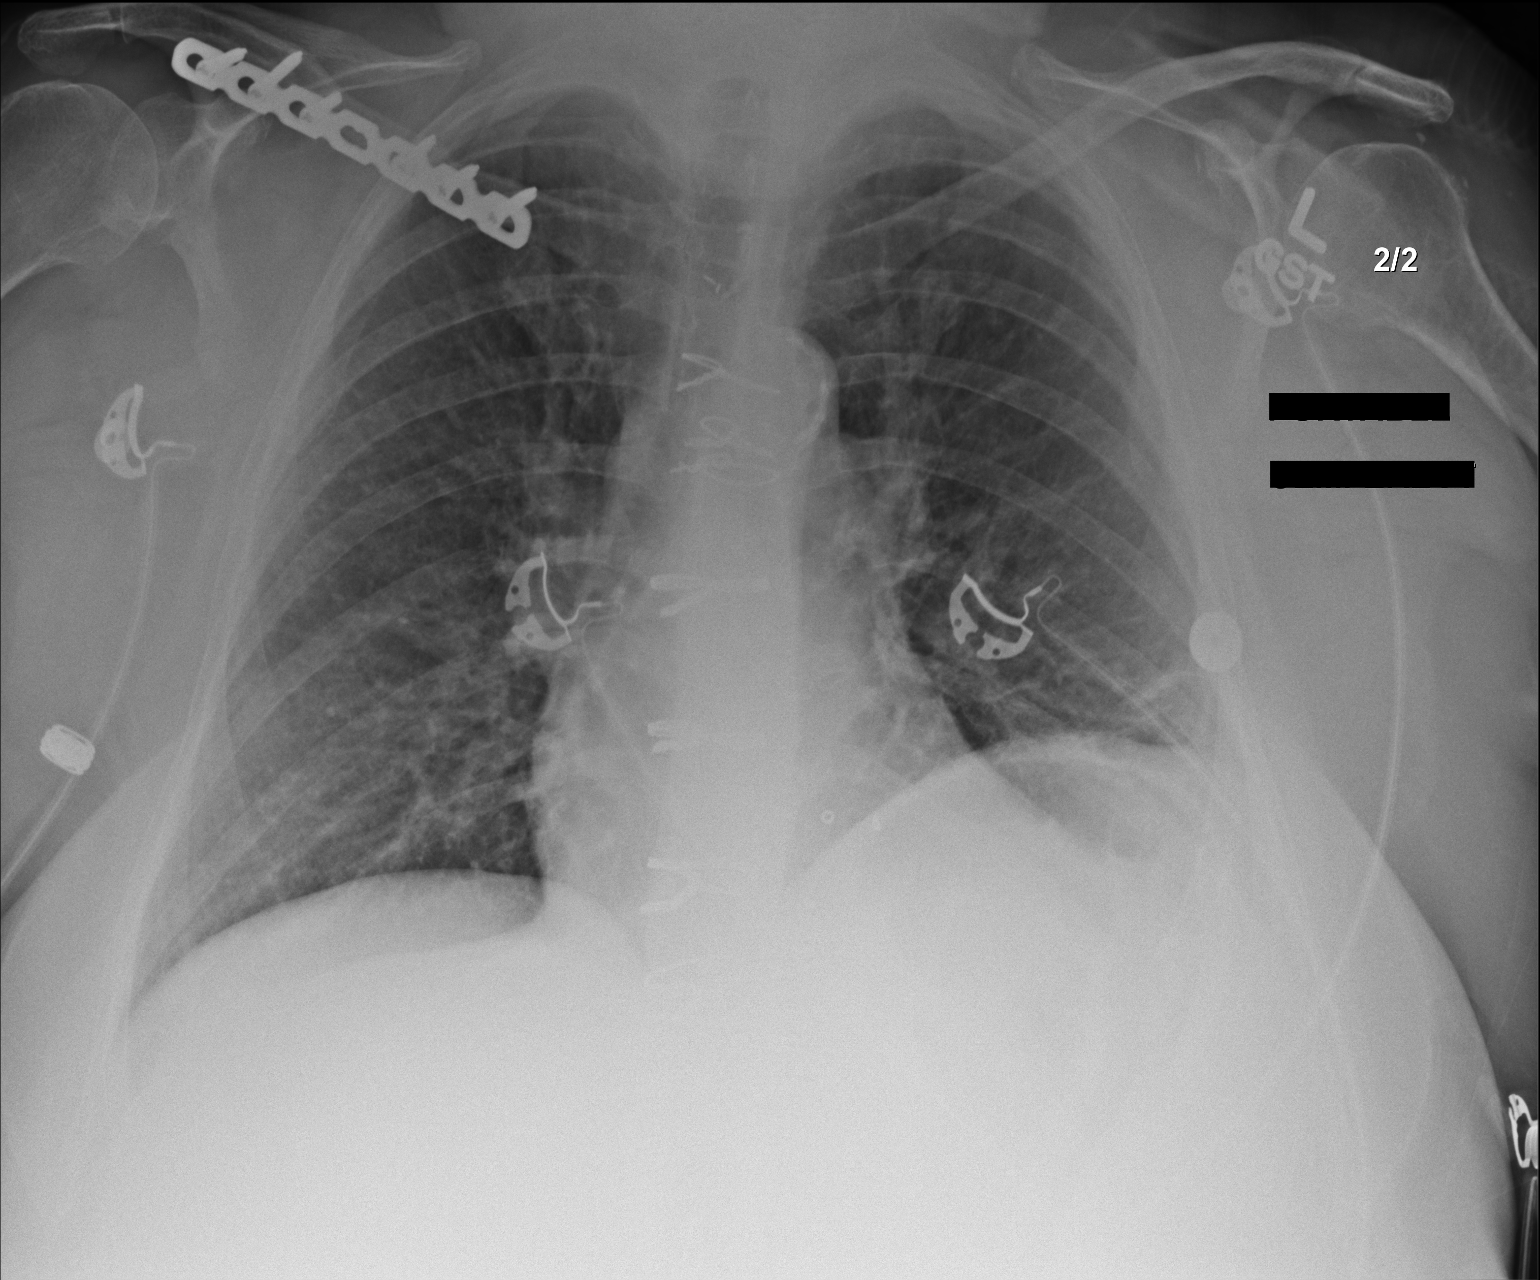

[2 of 2 positions shown; findings below may reference images not displayed]

FINDINGS: The cardiac silhouette, mediastinal and hilar contours
are within normal limits and stable.  Stable surgical changes with
median sternotomy wires and right clavicle hardware.  There is
stable mild eventration of the left hemidiaphragm with overlying
vascular crowding and atelectasis.  There is peribronchial
thickening and increased interstitial markings which could suggest
bronchitis or mild interstitial edema.  No definite pleural
effusions.
IMPRESSION: Peribronchial thickening and increased interstitial markings likely
reflecting bronchitis.  Cannot exclude mild interstitial edema.

## 2014-09-11 IMAGING — CR DG CHEST 1V PORT
1 series · 1 of 1 positions shown · non-contrast
Comparison: 05/04/2012.

CLINICAL DATA: Respiratory failure.  Short of breath.

PORTABLE CHEST - 1 VIEW

[AP]
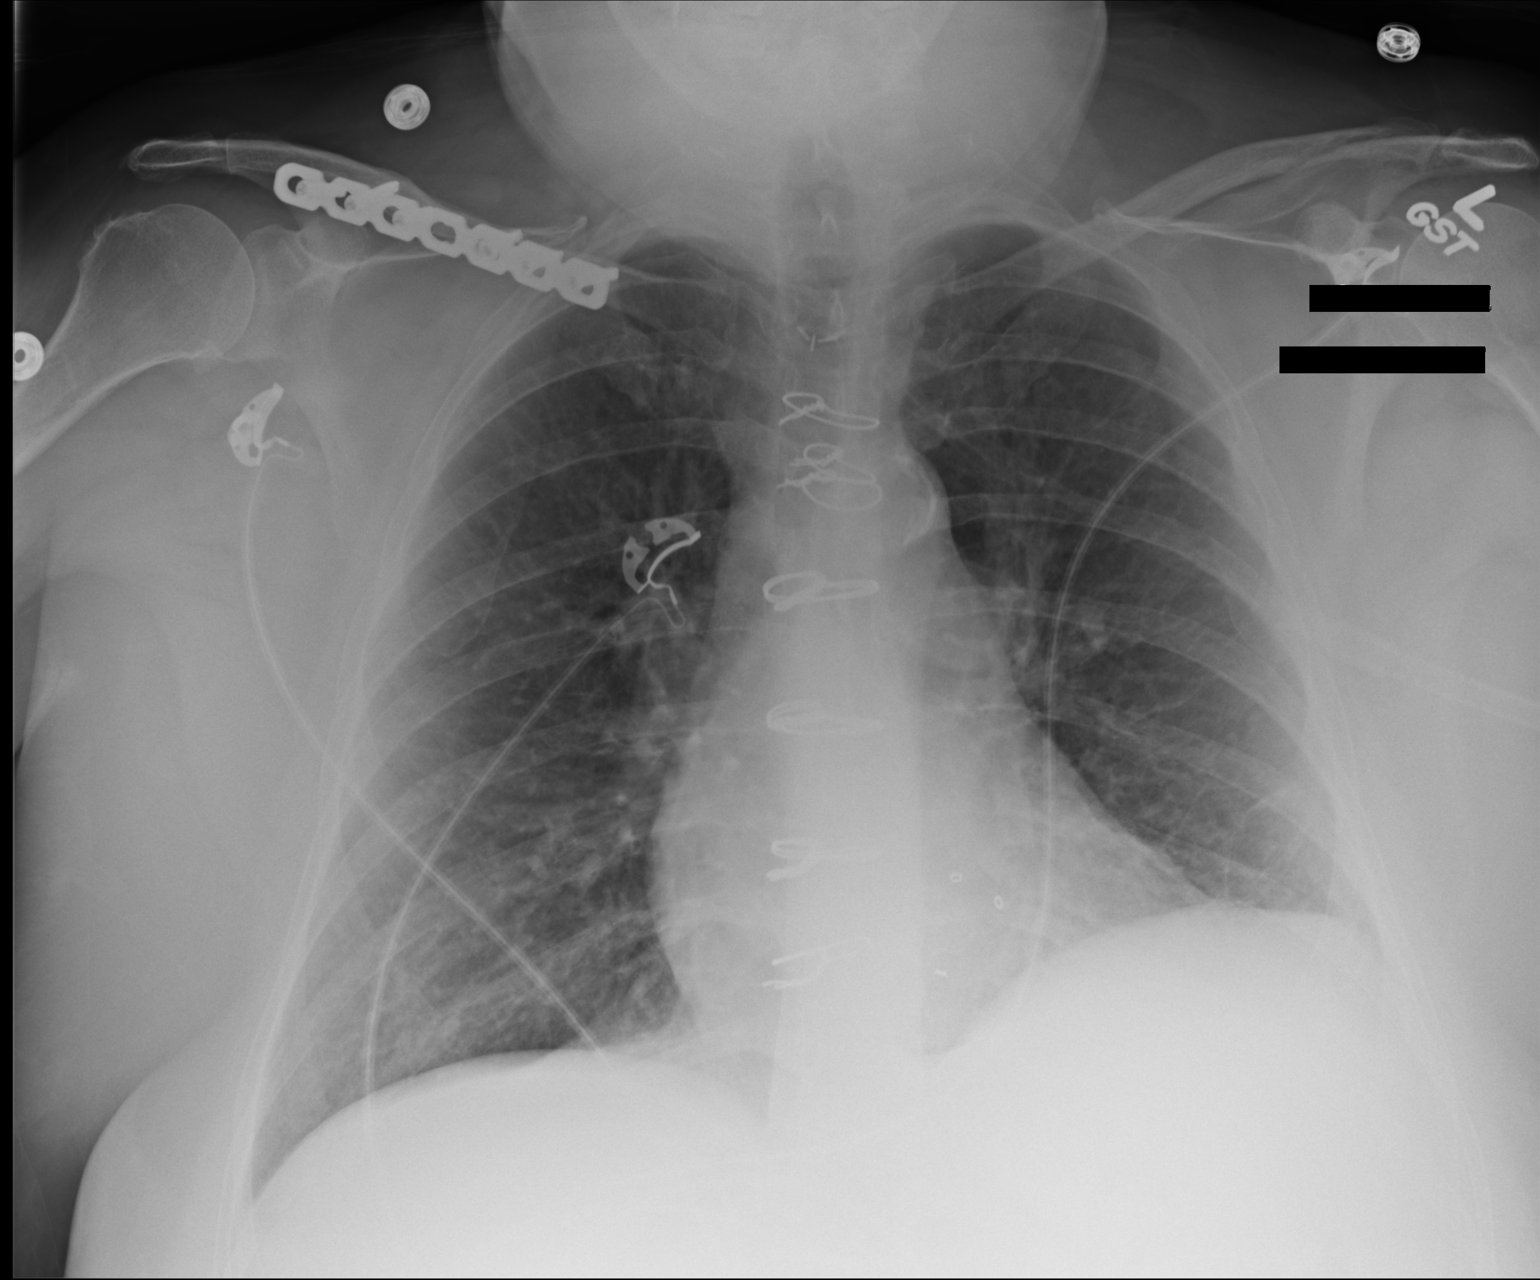

[1 of 1 positions shown; findings below may reference images not displayed]

FINDINGS: Median sternotomy / CABG.  Pulmonary aeration appears
little changed.  Unchanged increased interstitial markings.  Right
clavicle plating.  Bioprosthetic mitral valve replacement.
IMPRESSION: No interval change.

## 2014-09-13 ENCOUNTER — Other Ambulatory Visit: Payer: Self-pay | Admitting: Internal Medicine

## 2014-09-19 ENCOUNTER — Other Ambulatory Visit: Payer: Self-pay | Admitting: Internal Medicine

## 2014-09-19 DIAGNOSIS — I480 Paroxysmal atrial fibrillation: Secondary | ICD-10-CM

## 2014-09-25 ENCOUNTER — Ambulatory Visit (INDEPENDENT_AMBULATORY_CARE_PROVIDER_SITE_OTHER): Payer: PPO | Admitting: Pharmacist

## 2014-09-25 ENCOUNTER — Other Ambulatory Visit: Payer: PPO

## 2014-09-25 DIAGNOSIS — M545 Low back pain, unspecified: Secondary | ICD-10-CM

## 2014-09-25 DIAGNOSIS — Z7901 Long term (current) use of anticoagulants: Secondary | ICD-10-CM | POA: Diagnosis not present

## 2014-09-25 DIAGNOSIS — G8929 Other chronic pain: Secondary | ICD-10-CM

## 2014-09-25 DIAGNOSIS — I480 Paroxysmal atrial fibrillation: Secondary | ICD-10-CM | POA: Diagnosis not present

## 2014-09-25 LAB — POCT INR: INR: 2.3

## 2014-09-25 NOTE — Progress Notes (Signed)
Anti-Coagulation Progress Note  [name] is a 64 y.o. female who is currently on an anti-coagulation regimen.    RECENT RESULTS: Recent results are below, the most recent result is correlated with a dose of 20 mg. per week: Lab Results  Component Value Date   INR 2.30 09/25/2014   INR 2.50 08/21/2014   INR 2.50 07/19/2014    ANTI-COAG DOSE: Anticoagulation Dose Instructions as of 09/25/2014      [name] Tue Wed Thu Fri Sat   New Dose 2.5 mg 5 mg 2.5 mg 2.5 mg 2.5 mg 2.5 mg 2.5 mg       ANTICOAG SUMMARY: Anticoagulation Episode Summary    Current INR goal 2.0-3.0  Next INR check 10/23/2014  INR from last check 2.30 (09/25/2014)  Weekly max dose   Target end date   INR check location [facility]  Preferred lab   Send INR reminders to [place]   Indications  Paroxysmal atrial fibrillation [I48.0] Long term (current) use of anticoagulants [Z79.01]        Comments       Anticoagulation Care Providers    Provider Role Specialty Phone number   Lelon Perla, MD  Cardiology [phone]      ANTICOAG TODAY: Anticoagulation Summary as of 09/25/2014    INR goal 2.0-3.0  Selected INR 2.30 (09/25/2014)  Next INR check 10/23/2014  Target end date    Indications  Paroxysmal atrial fibrillation [I48.0] Long term (current) use of anticoagulants [Z79.01]      Anticoagulation Episode Summary    INR check location [facility]   Preferred lab    Send INR reminders to [place]   Comments     Anticoagulation Care Providers    Provider Role Specialty Phone number   Lelon Perla, MD  Cardiology [phone]      PATIENT INSTRUCTIONS: Patient Instructions  Patient instructed to take medications as defined in the Anti-coagulation Track section of this encounter.  Patient instructed to take today's dose.  Patient verbalized understanding of these instructions.       FOLLOW-UP Return in 4 weeks (on 10/23/2014)  for Follow up INR at 1000h.  Jorene Guest, III Pharm.D., CACP

## 2014-09-25 NOTE — Patient Instructions (Signed)
Patient instructed to take medications as defined in the Anti-coagulation Track section of this encounter.  Patient instructed to take today's dose.  Patient verbalized understanding of these instructions.    

## 2014-09-28 NOTE — Progress Notes (Signed)
I have reviewed Dr. Gladstone Pih note.  INR at goal.  Coumadin not changed.  On coumadin for Afib.

## 2014-10-02 LAB — BENZODIAZEPINES CONFIRM, URINE
ALPRAZOLAM CONFIRM: 222 ng/mL
ALPRAZOLAM: POSITIVE — AB
BENZODIAZEPINES: POSITIVE ng/mL — AB
CLONAZEPAM: NEGATIVE
Flurazepam: NEGATIVE
LORAZEPAM: NEGATIVE
MIDAZOLAM: NEGATIVE
NORDIAZEPAM: NEGATIVE
Oxazepam: NEGATIVE
TEMAZEPAM: NEGATIVE
TRIAZOLAM: NEGATIVE

## 2014-10-02 LAB — PRESCRIPTION ABUSE MONITORING 17P, URINE
6-Acetylmorphine, Urine: NEGATIVE ng/mL
Amphetamine Scrn, Ur: NEGATIVE ng/mL
BARBITURATE SCREEN URINE: NEGATIVE ng/mL
Buprenorphine, Urine: NEGATIVE ng/mL
CANNABINOIDS UR QL SCN: NEGATIVE ng/mL
Carisoprodol/Meprobamate, Ur: NEGATIVE ng/mL
Cocaine (Metab) Scrn, Ur: NEGATIVE ng/mL
Creatinine(Crt), U: 171.3 mg/dL (ref 20.0–300.0)
EDDP, Urine: NEGATIVE ng/mL
Fentanyl, Urine: NEGATIVE pg/mL
MDMA Screen, Urine: NEGATIVE ng/mL
Meperidine Screen, Urine: NEGATIVE ng/mL
Methadone Screen, Urine: NEGATIVE ng/mL
Nitrite Urine, Quantitative: NEGATIVE ug/mL
OXYCODONE+OXYMORPHONE UR QL SCN: NEGATIVE ng/mL
Ph of Urine: 5.4 (ref 4.5–8.9)
Phencyclidine Qn, Ur: NEGATIVE ng/mL
Propoxyphene Scrn, Ur: NEGATIVE ng/mL
SPECIFIC GRAVITY: 1.012
Tapentadol, Urine: NEGATIVE ng/mL
Tramadol Screen, Urine: NEGATIVE ng/mL

## 2014-10-02 LAB — OPIATES CONFIRMATION, URINE
Codeine: NEGATIVE
HYDROCODONE CONFIRM: 1070 ng/mL
Hydrocodone: POSITIVE — AB
Hydromorphone: NEGATIVE
Morphine: NEGATIVE
Opiates: POSITIVE ng/mL — AB

## 2014-10-04 ENCOUNTER — Encounter: Payer: Self-pay | Admitting: Internal Medicine

## 2014-10-04 DIAGNOSIS — C642 Malignant neoplasm of left kidney, except renal pelvis: Secondary | ICD-10-CM

## 2014-10-06 ENCOUNTER — Encounter: Payer: Self-pay | Admitting: Internal Medicine

## 2014-10-06 ENCOUNTER — Ambulatory Visit (INDEPENDENT_AMBULATORY_CARE_PROVIDER_SITE_OTHER): Payer: PPO | Admitting: Internal Medicine

## 2014-10-06 VITALS — BP 124/63 | HR 76 | Temp 98.2°F | Ht 64.0 in | Wt 192.1 lb

## 2014-10-06 DIAGNOSIS — Z79899 Other long term (current) drug therapy: Secondary | ICD-10-CM

## 2014-10-06 DIAGNOSIS — E669 Obesity, unspecified: Secondary | ICD-10-CM

## 2014-10-06 DIAGNOSIS — M545 Low back pain, unspecified: Secondary | ICD-10-CM

## 2014-10-06 DIAGNOSIS — Z23 Encounter for immunization: Secondary | ICD-10-CM | POA: Diagnosis not present

## 2014-10-06 DIAGNOSIS — I480 Paroxysmal atrial fibrillation: Secondary | ICD-10-CM

## 2014-10-06 DIAGNOSIS — D638 Anemia in other chronic diseases classified elsewhere: Secondary | ICD-10-CM

## 2014-10-06 DIAGNOSIS — E1140 Type 2 diabetes mellitus with diabetic neuropathy, unspecified: Secondary | ICD-10-CM

## 2014-10-06 DIAGNOSIS — Z794 Long term (current) use of insulin: Secondary | ICD-10-CM

## 2014-10-06 DIAGNOSIS — Z720 Tobacco use: Secondary | ICD-10-CM

## 2014-10-06 DIAGNOSIS — K219 Gastro-esophageal reflux disease without esophagitis: Secondary | ICD-10-CM

## 2014-10-06 DIAGNOSIS — Z0000 Encounter for general adult medical examination without abnormal findings: Secondary | ICD-10-CM

## 2014-10-06 DIAGNOSIS — I501 Left ventricular failure: Secondary | ICD-10-CM

## 2014-10-06 DIAGNOSIS — Z7901 Long term (current) use of anticoagulants: Secondary | ICD-10-CM

## 2014-10-06 DIAGNOSIS — F329 Major depressive disorder, single episode, unspecified: Secondary | ICD-10-CM

## 2014-10-06 DIAGNOSIS — Z6832 Body mass index (BMI) 32.0-32.9, adult: Secondary | ICD-10-CM

## 2014-10-06 DIAGNOSIS — I5032 Chronic diastolic (congestive) heart failure: Secondary | ICD-10-CM

## 2014-10-06 DIAGNOSIS — F332 Major depressive disorder, recurrent severe without psychotic features: Secondary | ICD-10-CM

## 2014-10-06 DIAGNOSIS — J309 Allergic rhinitis, unspecified: Secondary | ICD-10-CM

## 2014-10-06 DIAGNOSIS — M810 Age-related osteoporosis without current pathological fracture: Secondary | ICD-10-CM

## 2014-10-06 DIAGNOSIS — F17200 Nicotine dependence, unspecified, uncomplicated: Secondary | ICD-10-CM

## 2014-10-06 DIAGNOSIS — J301 Allergic rhinitis due to pollen: Secondary | ICD-10-CM

## 2014-10-06 DIAGNOSIS — G8929 Other chronic pain: Secondary | ICD-10-CM

## 2014-10-06 LAB — POCT GLYCOSYLATED HEMOGLOBIN (HGB A1C): Hemoglobin A1C: 6

## 2014-10-06 LAB — GLUCOSE, CAPILLARY: Glucose-Capillary: 112 mg/dL — ABNORMAL HIGH (ref 65–99)

## 2014-10-06 MED ORDER — CHLORPHENIRAMINE MALEATE 4 MG PO TABS: 4.0000 mg | ORAL_TABLET | Freq: Three times a day (TID) | ORAL | Status: DC | PRN

## 2014-10-06 MED ORDER — GABAPENTIN 300 MG PO CAPS: 300.0000 mg | ORAL_CAPSULE | Freq: Three times a day (TID) | ORAL | Status: DC

## 2014-10-06 NOTE — Assessment & Plan Note (Signed)
Her dyspnea is stable and likely multifactorial. That being said, she is doing well from a heart failure standpoint while on the Lasix 80 mg by mouth twice daily. We will continue the Lasix and the potassium supplementation and reassess her symptoms at the follow-up visit.

## 2014-10-06 NOTE — Assessment & Plan Note (Signed)
She received the flu vaccination today and had a hepatitis C antibody test drawn as recommended by national guidelines. She is otherwise up-to-date on her preventative health care maintenance.

## 2014-10-06 NOTE — Assessment & Plan Note (Addendum)
She continues to have chronic low back pain despite the Vicodin. We have tried multiple other medications without success. We've also tried long-acting narcotics with MS Contin which resulted in increase in falls. I believe the right-sided flank pain is actually a rib fracture from her osteoporosis. This is because of the pleuritic nature of the pain. I therefore do not feel this is passing of her known right renal calculus. She will also try to obtain a TENS unit that I ordered at the last visit to assess its efficacy in managing her chronic back pain. We will continue with the current narcotic pain regimen for her chronic back pain and reassess her symptoms at the follow-up visit.

## 2014-10-06 NOTE — Assessment & Plan Note (Signed)
I suspect her right flank pain is secondary to a rib fracture given the pleuritic nature. She remains uninterested and alendronate therapy. We will continue with the calcium and vitamin D supplementation and continue to encourage smoking cessation. We are also continuing the chronic Vicodin which may be helpful for her presumed acute rib fracture.

## 2014-10-06 NOTE — Assessment & Plan Note (Signed)
>>  ASSESSMENT AND PLAN FOR CHRONIC CHF (CONGESTIVE HEART FAILURE) ([facility]) WRITTEN ON 10/06/2014  3:08 PM BY Doneen Poisson, MD  Her dyspnea is stable and likely multifactorial. That being said, she is doing well from a heart failure standpoint while on the Lasix 80 mg by mouth twice daily. We will continue the Lasix and the potassium supplementation and reassess her symptoms at the follow-up visit.

## 2014-10-06 NOTE — Assessment & Plan Note (Signed)
We again discussed the importance of smoking cessation with regards to her lung disease, heart disease, cancer risks, and diabetes. She is aware of all of this but is not mentally ready to quit smoking at this time. We will readdress the issue at the follow-up visit.

## 2014-10-06 NOTE — Assessment & Plan Note (Signed)
As she has anemia of chronic disease rather than iron deficiency anemia she was asked to discontinue her supplemental iron therapy as it is unlikely contributing to her health.

## 2014-10-06 NOTE — Assessment & Plan Note (Signed)
Her weight was stable at 192 pounds, unchanged from the last visit. She was encouraged to continue with this weight control as it likely is helping manage her diabetes. We will reassess her weight at the follow-up visit.

## 2014-10-06 NOTE — Patient Instructions (Signed)
It was great to see you again.  You are doing a great job with your diabetes.  1) Increase the gabapentin to 300 mg three times a day for your burning in the feet.  2) Stop the iron.  3) I will look into the costs of alternatives for coumadin.  4) Keep taking all of the other medications as you are.  5) I will call you with any lab results that are concerning.  6) Consider trying the TENS unit for your back pain.  I will see you back in 3 months, sooner if necessary.

## 2014-10-06 NOTE — Assessment & Plan Note (Signed)
Her gastroesophageal reflux symptoms are once again controlled with the twice-daily PPI therapy. We will therefore continue the omeprazole at 40 mg by mouth twice daily. She simply did not respond to the H2 blocker therapy either alone or in combination with daily PPI therapy.

## 2014-10-06 NOTE — Progress Notes (Signed)
   Subjective:    Patient ID: [name], female    DOB: 02/13/1950, 64 y.o.   MRN: [id number]  HPI  [name] is here for follow-up of her diabetes, depression, and chronic pain. Please see the A&P for the status of the pt's chronic medical problems.  Review of Systems  Constitutional: Positive for fatigue. Negative for activity change, appetite change and unexpected weight change.  HENT: Positive for postnasal drip, rhinorrhea and sinus pressure.   Respiratory: Positive for shortness of breath. Negative for wheezing.   Cardiovascular: Negative for chest pain and leg swelling.  Gastrointestinal: Negative for nausea, vomiting, abdominal pain, diarrhea, constipation and abdominal distention.  Genitourinary: Positive for flank pain.       Flank pain worse with movement, cough, deep breathing, or lying on the area.  Musculoskeletal: Positive for myalgias, back pain and arthralgias. Negative for joint swelling.  Skin: Negative for color change and wound.       Dry skin of the feet bilaterally  Allergic/Immunologic: Positive for environmental allergies.  Psychiatric/Behavioral: Positive for sleep disturbance and dysphoric mood.      Objective:   Physical Exam  Constitutional: She is oriented to person, place, and time. She appears well-developed and well-nourished. No distress.  HENT:  Head: Normocephalic and atraumatic.  Eyes: Conjunctivae are normal. Right eye exhibits no discharge. Left eye exhibits no discharge. No scleral icterus.  Cardiovascular: Normal rate and regular rhythm.  Exam reveals no gallop and no friction rub.   Murmur heard. Early systolic murmur at the LUSB  Pulmonary/Chest: Effort normal. No respiratory distress. She has no wheezes. She has no rales. She exhibits tenderness.  Right flank tenderness with posterior basilar inspiratory rhonchi and crackles.  Abdominal: Soft. Bowel sounds are normal. She exhibits no distension. There is no tenderness. There  is no rebound and no guarding.  Musculoskeletal: Normal range of motion. She exhibits no edema or tenderness.  Neurological: She is alert and oriented to person, place, and time. She exhibits normal muscle tone.  Skin: Skin is warm and dry. No rash noted. She is not diaphoretic. No erythema.  Xerosis of bilateral feet.  Psychiatric: She has a normal mood and affect. Her behavior is normal. Judgment and thought content normal.  Nursing note and vitals reviewed.     Assessment & Plan:   Please see problem based charting.

## 2014-10-06 NOTE — Assessment & Plan Note (Signed)
She's had a recent flare of her rhinitis. Apparently she has not been taking her chlorpheniramine. I encouraged her to take the chlorpheniramine 4 mg by mouth every 8 hours as needed for rhinitis. We will reassess the efficacy of this intervention at the follow-up visit.

## 2014-10-06 NOTE — Assessment & Plan Note (Signed)
She continues to have some chronic depression symptoms. She is continuing the sertraline 200 mg by mouth daily and the bupropion 100 mg by mouth twice daily. She is yet to schedule a psychotherapy appointment but states will do so. We will reassess her depressive symptoms at the follow-up visit.

## 2014-10-06 NOTE — Assessment & Plan Note (Signed)
She continues with the Coumadin although the lack of transportation makes monitoring very difficult for her. Now that she has [employer] insurance she may qualify for a lower co-pay on her anticoagulation if we were to switch to a NOAC. I will ask Dr. [name] to look into the potential costs of conversion from warfarin therapy to a NOAC. In the meantime, we will continue with the warfarin anticoagulation.

## 2014-10-06 NOTE — Assessment & Plan Note (Signed)
Her diabetes today is extremely well controlled with hemoglobin A1c of 6.0. This is with some weight loss as well as taking her Levemir 45 units twice daily, NovoLog 18 units with meals, and sliding scale. There are occasions where she does not require any insulin as her sugars are well controlled when checked. A urine microalbumin was checked today and is pending at the time of this dictation. She has also recently had a diabetic retinal examination, the results of which we do not yet have in our record.  She does note worsening burning in her feet that is concerning for a progression of her diabetic neuropathy. She is currently only on gabapentin 300 mg by mouth twice daily for this. We will increase the gabapentin to 300 mg by mouth 3 times daily and reassess her symptoms at the follow-up visit. If necessary, we will consider increasing the dose of the gabapentin at the follow-up visit.

## 2014-10-07 ENCOUNTER — Telehealth: Payer: Self-pay | Admitting: Internal Medicine

## 2014-10-07 LAB — HEPATITIS C ANTIBODY: Hep C Virus Ab: <0.1 s/co ratio (ref 0.0–0.9)

## 2014-10-07 LAB — MICROALBUMIN / CREATININE URINE RATIO
Creatinine, Urine: 126.5 mg/dL
MICROALB/CREAT RATIO: 8.8 mg/g creat (ref 0.0–30.0)
Microalbumin, Urine: 11.1 ug/mL

## 2014-10-07 LAB — BASIC METABOLIC PANEL
BUN/Creatinine Ratio: 17 (ref 11–26)
BUN: 18 mg/dL (ref 8–27)
CO2: 27 mmol/L (ref 18–29)
Calcium: 9.3 mg/dL (ref 8.7–10.3)
Chloride: 98 mmol/L (ref 97–108)
Creatinine, Ser: 1.06 mg/dL — ABNORMAL HIGH (ref 0.57–1.00)
GFR calc Af Amer: 64 mL/min/{1.73_m2} (ref >59)
GFR calc non Af Amer: 55 mL/min/{1.73_m2} — ABNORMAL LOW (ref >59)
Glucose: 92 mg/dL (ref 65–99)
Potassium: 4.2 mmol/L (ref 3.5–5.2)
Sodium: 142 mmol/L (ref 134–144)

## 2014-10-07 NOTE — Telephone Encounter (Signed)
   Reason for call:   I received a call from [name] at 10:30 PM indicating that she needed help controlling her back pain.   Pertinent Data:   Reports she saw Dr. Eppie Gibson in clinic on 8/26 and he thought it might be a rib fracture  She decided to not get an x-ray at that time  Dr. Eppie Gibson had recommended pain management with her chronic opioid regimen of Norco 5-325 mg 1-2 tablets every 6 hours PRN to control her pain  Patient reports her pain has not been controlled at all and that she has "never felt pain like this before." She states she took 2 Norco pills at 2 PM today and none since that time. She has not taken any other analgesics.   She denies any falls.   Assessment / Plan / Recommendations:   Recommended conservative measures of heating pad and capsaicin cream (patient has leftover tube) and to take another Norco 5-325 mg 2 tablets now  If pain not controlled with Norco, then recommended to take ibuprofen 600 mg once a few hours later  Will schedule a clinic appointment for patient to be seen early next week to help with her pain control  Patient agreeable to pain   As always, pt is advised that if symptoms worsen or new symptoms arise, they should go to an urgent care facility or to to ER for further evaluation.   Juliet Rude, MD   10/07/2014, 10:45 PM

## 2014-10-09 ENCOUNTER — Other Ambulatory Visit: Payer: Self-pay | Admitting: *Deleted

## 2014-10-09 DIAGNOSIS — I6523 Occlusion and stenosis of bilateral carotid arteries: Secondary | ICD-10-CM

## 2014-10-10 ENCOUNTER — Telehealth: Payer: Self-pay | Admitting: Internal Medicine

## 2014-10-10 NOTE — Telephone Encounter (Signed)
Thank you for scheduling follow-up.  She may need dedicated rib films to look for a broken rib given osteoporosis.  She also has a h/o a 5 mm non-obstructing renal calculus on the right that has not previously been symptomatic.  This should also be kept in the differential and specific questions asked concerning this possibility as a cause of her right flank pain.

## 2014-10-10 NOTE — Progress Notes (Signed)
Urine creatinine 126.5 Urine microalbumin 11.1 Urine microalbumin/creatinine ratio: 8.8  No evidence of diabetic nephropathy or microalbuminuria at this point.  Will continue working on diabetic control to help avoid microvascular complications.  BMET: K 4.2, BUN 18, Creatinine 1.06, eGFR 55  Hepatitis C Ab: Negative

## 2014-10-10 NOTE — Telephone Encounter (Addendum)
Returned call to pt,  she was in clinic on Friday.  She had right side pain and was examined.  ? Cracked rib.  If cracked only treatment is pain med which pt has already so she did not want xray.  Came home that day,  took nap and when she got up she had so much pain that she can't do anything. This has been constant since Friday. Using heat, capsaicin lotion, and increase in pain med. She is taking Norco 2 every 4 - 6 hour  and Advil in between. She talked with resident on 8/27 .  With increase pain that is constant I scheduled pt for evaluation tomorrow. She has someone staying with her. Pt denies any SOB Clinic visit 8/31 with Dr. Arcelia Jew.

## 2014-10-10 NOTE — Telephone Encounter (Signed)
Pt called requesting the nurse to call back.

## 2014-10-11 ENCOUNTER — Ambulatory Visit: Payer: PPO | Admitting: Internal Medicine

## 2014-10-11 ENCOUNTER — Ambulatory Visit (HOSPITAL_COMMUNITY)
Admission: RE | Admit: 2014-10-11 | Discharge: 2014-10-11 | Disposition: A | Discharge status: Home / Self Care | Payer: PPO | Source: Ambulatory Visit | Attending: Internal Medicine | Admitting: Internal Medicine

## 2014-10-11 ENCOUNTER — Telehealth: Payer: Self-pay | Admitting: Internal Medicine

## 2014-10-11 ENCOUNTER — Ambulatory Visit (INDEPENDENT_AMBULATORY_CARE_PROVIDER_SITE_OTHER): Payer: PPO | Admitting: Internal Medicine

## 2014-10-11 ENCOUNTER — Encounter: Payer: Self-pay | Admitting: Internal Medicine

## 2014-10-11 DIAGNOSIS — R0781 Pleurodynia: Secondary | ICD-10-CM

## 2014-10-11 DIAGNOSIS — M546 Pain in thoracic spine: Secondary | ICD-10-CM | POA: Diagnosis not present

## 2014-10-11 DIAGNOSIS — S2241XA Multiple fractures of ribs, right side, initial encounter for closed fracture: Secondary | ICD-10-CM | POA: Diagnosis not present

## 2014-10-11 DIAGNOSIS — M810 Age-related osteoporosis without current pathological fracture: Secondary | ICD-10-CM

## 2014-10-11 DIAGNOSIS — R0602 Shortness of breath: Secondary | ICD-10-CM

## 2014-10-11 MED ORDER — LIDOCAINE 5 % EX PTCH: 1.0000 | MEDICATED_PATCH | CUTANEOUS | Status: DC

## 2014-10-11 MED ORDER — KETOROLAC TROMETHAMINE 15 MG/ML IJ SOLN: 15.0000 mg | Freq: Once | INTRAMUSCULAR | Status: DC

## 2014-10-11 NOTE — Patient Instructions (Signed)
-  Continue to use Vicodin 5-10 mg every 4 to 6 hours as needed - Try using Lidocaine patches. Apply one patch to the affected area every 12 hours. - Rib x-ray today  General Instructions:   Please bring your medicines with you each time you come to clinic.  Medicines may include prescription medications, over-the-counter medications, herbal remedies, eye drops, vitamins, or other pills.   Progress Toward Treatment Goals:  Treatment Goal 05/19/2014  Hemoglobin A1C at goal  Blood pressure -  Stop smoking smoking the same amount  Prevent falls -    Self Care Goals & Plans:  Self Care Goal 10/11/2014  Manage my medications take my medicines as prescribed; bring my medications to every visit; refill my medications on time  Monitor my health -  Eat healthy foods drink diet soda or water instead of juice or soda; eat more vegetables; eat foods that are low in salt; eat baked foods instead of fried foods; eat fruit for snacks and desserts  Be physically active -  Stop smoking -  Prevent falls -  Meeting treatment goals -    Home Blood Glucose Monitoring 12/19/2013  Check my blood sugar 4 times a day  When to check my blood sugar before meals     Care Management & Community Referrals:  Referral 05/19/2014  Referrals made for care management support none needed  Referrals made to community resources none

## 2014-10-11 NOTE — Assessment & Plan Note (Signed)
Patient with a 6 day hx of right-sided upper back pain. Tenderness is localized to the right 10th rib and extends to the lateral side along the rib. She did admit to having a fall on her right side about 3-4 weeks ago. She has also had rib fractures in the past related to her osteoporosis. Her pain is likely secondary to a rib fracture. She does have a history of a 5 mm non-obstructing renal calculus but I have low suspicion for her pain being related to this as she does not have CVA tenderness on exam and no dysuria, hematuria, difficulties urinating, abdominal pain, nausea, or vomiting.  - Will obtain right-sided rib x-ray  - Prescribed Lidocaine patches for pain control - Recommended to continue Vicodin

## 2014-10-11 NOTE — Progress Notes (Signed)
Subjective:    Patient ID: [name], female    DOB: 1950/09/15, 64 y.o.   MRN: [id number]  HPI [name] is a 64yo woman with PMHx of CHF, paroxysmal AFib, moderate to severe pulmonary HTN, and emphysema on oxygen who presents today for an acute visit.  Patient was evaluated on 8/30 by her PCP and she had reported right flank/back pain at that time. There was concern for a possible rib fracture given the patient's hx of osteoporosis but she refused x-ray at that time. She was recommended to continue her chronic opioid regimen of Norco 5-325 mg 1-2 tablets Q6H PRN to control her pain. Then the following night I actually was the resident on call and spoke to her by phone about pain control. I had recommended to try heating pads, capsaicin cream, continue Norco, and also try ibuprofen 600 mg in between Norco dosing. Patient notes the pain has continued to worsen and that none of the mentioned remedies have helped her. She reports having broken ribs in the past due to coughing with her osteoporosis. She is unsure if the pain feels similar. She also has a history of a 5 mm non-obstructing renal calculus on the right side. She denies any dysuria, hematuria, abdominal pain, or difficulties urinating. She does note she fell on her right side about 3-4 weeks ago, but did not notice the pain until last Friday when she saw her PCP.    Review of Systems General: Denies fever, chills, night sweats, changes in weight, changes in appetite HEENT: Denies headaches, ear pain, changes in vision, rhinorrhea, sore throat CV: Denies CP, palpitations, orthopnea Pulm: Reports SOB- chronic. Denies cough, wheezing GI: Denies abdominal pain, nausea, vomiting, diarrhea, constipation, melena, hematochezia GU: Denies dysuria, hematuria, frequency Msk: Reports right sided back pain.  Neuro: Denies weakness, numbness, tingling Skin: Denies rashes, bruising    Objective:   Physical Exam General: alert, sitting up  in chair, appears to be in pain CV: RRR, no m/g/r Pulm: CTA bilaterally, breaths non-labored on oxygen via [place] Back: Extreme focal tenderness to palpation along the right 10th rib that extends to the lateral side. Patient has difficulty moving in her chair secondary to pain. No CVA tenderness. Neuro: alert and oriented x 3    Assessment & Plan:  Please refer to A&P documentation.

## 2014-10-11 NOTE — Telephone Encounter (Signed)
   Reason for call:   I received a call from [name] at 5:45 PM indicating that her pharmacy says the insurance company needs additional information before they will prescribe the lidoderm patches.   Pertinent Data:   She was seen in [facility] this afternoon and received rx for lidoderm.   Assessment / Plan / Recommendations:   She was informed that I would forward this information to Dr. Rivet/[facility] so that prior-authorization can be obtained.   As always, pt is advised that if symptoms worsen or new symptoms arise, they should go to an urgent care facility or to to ER for further evaluation.   [name], DO   10/11/2014, 5:50 PM

## 2014-10-12 ENCOUNTER — Other Ambulatory Visit: Payer: Self-pay | Admitting: Internal Medicine

## 2014-10-12 ENCOUNTER — Telehealth: Payer: Self-pay | Admitting: *Deleted

## 2014-10-12 DIAGNOSIS — M546 Pain in thoracic spine: Secondary | ICD-10-CM

## 2014-10-12 DIAGNOSIS — E785 Hyperlipidemia, unspecified: Secondary | ICD-10-CM

## 2014-10-12 DIAGNOSIS — J439 Emphysema, unspecified: Secondary | ICD-10-CM

## 2014-10-12 DIAGNOSIS — F329 Major depressive disorder, single episode, unspecified: Secondary | ICD-10-CM

## 2014-10-12 LAB — GLUCOSE, CAPILLARY: Glucose-Capillary: 191 mg/dL — ABNORMAL HIGH (ref 65–99)

## 2014-10-12 MED ORDER — LIDOCAINE HCL 2 % EX GEL: 1.0000 "application " | Freq: Three times a day (TID) | CUTANEOUS | Status: DC

## 2014-10-12 MED ORDER — KETOROLAC TROMETHAMINE 15 MG/ML IJ SOLN
15.0000 mg | Freq: Once | INTRAMUSCULAR | Status: AC
  Administered 2014-10-11: 15 mg via INTRAMUSCULAR

## 2014-10-12 NOTE — Telephone Encounter (Signed)
Lidoderm non-preferred-insurance will not pay for it.  Preferred medication is the lidocaine jelly.  Discussed w/prescribing MD, rx changed to the jelly.  Contacted PPA to make them aware and submit rx to insurance company while I was on the phone. Rx went through without any problems.  PPA to d/c rx for the patches and contact pt about the new rx for the jelly.  Phone call complete.Regenia Skeeter, Darlene Cassady9/1/201610:22 AM

## 2014-10-12 NOTE — Addendum Note (Signed)
Addended by: Maxie Barb on: 10/12/2014 08:48 AM   Modules accepted: Orders

## 2014-10-13 ENCOUNTER — Emergency Department (HOSPITAL_COMMUNITY): Payer: PPO

## 2014-10-13 ENCOUNTER — Telehealth: Payer: Self-pay | Admitting: Internal Medicine

## 2014-10-13 ENCOUNTER — Encounter (HOSPITAL_COMMUNITY): Payer: Self-pay | Admitting: Emergency Medicine

## 2014-10-13 ENCOUNTER — Inpatient Hospital Stay (HOSPITAL_COMMUNITY)
Admission: EM | Admit: 2014-10-13 | Discharge: 2014-10-16 | DRG: 184 | Disposition: A | Discharge status: Home / Self Care | Payer: PPO | Attending: Internal Medicine | Admitting: Internal Medicine

## 2014-10-13 DIAGNOSIS — E1169 Type 2 diabetes mellitus with other specified complication: Secondary | ICD-10-CM | POA: Diagnosis present

## 2014-10-13 DIAGNOSIS — G4733 Obstructive sleep apnea (adult) (pediatric): Secondary | ICD-10-CM | POA: Diagnosis present

## 2014-10-13 DIAGNOSIS — M62838 Other muscle spasm: Secondary | ICD-10-CM | POA: Diagnosis present

## 2014-10-13 DIAGNOSIS — Y92009 Unspecified place in unspecified non-institutional (private) residence as the place of occurrence of the external cause: Secondary | ICD-10-CM

## 2014-10-13 DIAGNOSIS — G8929 Other chronic pain: Secondary | ICD-10-CM | POA: Diagnosis present

## 2014-10-13 DIAGNOSIS — S2249XA Multiple fractures of ribs, unspecified side, initial encounter for closed fracture: Secondary | ICD-10-CM | POA: Diagnosis present

## 2014-10-13 DIAGNOSIS — Z85528 Personal history of other malignant neoplasm of kidney: Secondary | ICD-10-CM

## 2014-10-13 DIAGNOSIS — Z952 Presence of prosthetic heart valve: Secondary | ICD-10-CM

## 2014-10-13 DIAGNOSIS — S2241XA Multiple fractures of ribs, right side, initial encounter for closed fracture: Principal | ICD-10-CM | POA: Diagnosis present

## 2014-10-13 DIAGNOSIS — Z79899 Other long term (current) drug therapy: Secondary | ICD-10-CM

## 2014-10-13 DIAGNOSIS — R52 Pain, unspecified: Secondary | ICD-10-CM | POA: Diagnosis present

## 2014-10-13 DIAGNOSIS — M810 Age-related osteoporosis without current pathological fracture: Secondary | ICD-10-CM | POA: Diagnosis present

## 2014-10-13 DIAGNOSIS — Z6835 Body mass index (BMI) 35.0-35.9, adult: Secondary | ICD-10-CM

## 2014-10-13 DIAGNOSIS — E1140 Type 2 diabetes mellitus with diabetic neuropathy, unspecified: Secondary | ICD-10-CM | POA: Diagnosis present

## 2014-10-13 DIAGNOSIS — E669 Obesity, unspecified: Secondary | ICD-10-CM | POA: Diagnosis present

## 2014-10-13 DIAGNOSIS — Z885 Allergy status to narcotic agent status: Secondary | ICD-10-CM

## 2014-10-13 DIAGNOSIS — I5032 Chronic diastolic (congestive) heart failure: Secondary | ICD-10-CM | POA: Diagnosis present

## 2014-10-13 DIAGNOSIS — I2722 Pulmonary hypertension due to left heart disease: Secondary | ICD-10-CM | POA: Diagnosis present

## 2014-10-13 DIAGNOSIS — Z7901 Long term (current) use of anticoagulants: Secondary | ICD-10-CM

## 2014-10-13 DIAGNOSIS — F17210 Nicotine dependence, cigarettes, uncomplicated: Secondary | ICD-10-CM | POA: Diagnosis present

## 2014-10-13 DIAGNOSIS — Z9981 Dependence on supplemental oxygen: Secondary | ICD-10-CM

## 2014-10-13 DIAGNOSIS — J439 Emphysema, unspecified: Secondary | ICD-10-CM | POA: Diagnosis present

## 2014-10-13 DIAGNOSIS — E118 Type 2 diabetes mellitus with unspecified complications: Secondary | ICD-10-CM | POA: Diagnosis present

## 2014-10-13 DIAGNOSIS — M545 Low back pain, unspecified: Secondary | ICD-10-CM

## 2014-10-13 DIAGNOSIS — Z794 Long term (current) use of insulin: Secondary | ICD-10-CM

## 2014-10-13 DIAGNOSIS — I272 Other secondary pulmonary hypertension: Secondary | ICD-10-CM | POA: Diagnosis present

## 2014-10-13 DIAGNOSIS — I872 Venous insufficiency (chronic) (peripheral): Secondary | ICD-10-CM | POA: Diagnosis present

## 2014-10-13 DIAGNOSIS — W07XXXA Fall from chair, initial encounter: Secondary | ICD-10-CM | POA: Diagnosis present

## 2014-10-13 DIAGNOSIS — Z888 Allergy status to other drugs, medicaments and biological substances status: Secondary | ICD-10-CM

## 2014-10-13 DIAGNOSIS — I480 Paroxysmal atrial fibrillation: Secondary | ICD-10-CM | POA: Diagnosis present

## 2014-10-13 LAB — CBC WITH DIFFERENTIAL/PLATELET
Basophils Absolute: 0 10*3/uL (ref 0.0–0.1)
Basophils Relative: 0 % (ref 0–1)
Eosinophils Absolute: 0.1 10*3/uL (ref 0.0–0.7)
Eosinophils Relative: 1 % (ref 0–5)
HCT: 34 % — ABNORMAL LOW (ref 36.0–46.0)
Hemoglobin: 10.3 g/dL — ABNORMAL LOW (ref 12.0–15.0)
Lymphocytes Relative: 11 % — ABNORMAL LOW (ref 12–46)
Lymphs Abs: 1.1 10*3/uL (ref 0.7–4.0)
MCH: 27.8 pg (ref 26.0–34.0)
MCHC: 30.3 g/dL (ref 30.0–36.0)
MCV: 91.6 fL (ref 78.0–100.0)
Monocytes Absolute: 0.8 10*3/uL (ref 0.1–1.0)
Monocytes Relative: 8 % (ref 3–12)
Neutro Abs: 7.7 10*3/uL (ref 1.7–7.7)
Neutrophils Relative %: 80 % — ABNORMAL HIGH (ref 43–77)
Platelets: 247 10*3/uL (ref 150–400)
RBC: 3.71 MIL/uL — ABNORMAL LOW (ref 3.87–5.11)
RDW: 16.7 % — ABNORMAL HIGH (ref 11.5–15.5)
WBC: 9.7 10*3/uL (ref 4.0–10.5)

## 2014-10-13 LAB — BASIC METABOLIC PANEL
Anion gap: 11 (ref 5–15)
BUN: 17 mg/dL (ref 6–20)
CO2: 28 mmol/L (ref 22–32)
Calcium: 9.2 mg/dL (ref 8.9–10.3)
Chloride: 101 mmol/L (ref 101–111)
Creatinine, Ser: 1.03 mg/dL — ABNORMAL HIGH (ref 0.44–1.00)
GFR calc Af Amer: >60 mL/min (ref >60)
GFR calc non Af Amer: 56 mL/min — ABNORMAL LOW (ref >60)
Glucose, Bld: 171 mg/dL — ABNORMAL HIGH (ref 65–99)
Potassium: 4.2 mmol/L (ref 3.5–5.1)
Sodium: 140 mmol/L (ref 135–145)

## 2014-10-13 LAB — URINALYSIS, ROUTINE W REFLEX MICROSCOPIC
Bilirubin Urine: NEGATIVE
Glucose, UA: NEGATIVE mg/dL
Hgb urine dipstick: NEGATIVE
Ketones, ur: NEGATIVE mg/dL
Leukocytes, UA: NEGATIVE
Nitrite: NEGATIVE
Protein, ur: NEGATIVE mg/dL
Specific Gravity, Urine: 1.008 (ref 1.005–1.030)
Urobilinogen, UA: 0.2 mg/dL (ref 0.0–1.0)
pH: 5.5 (ref 5.0–8.0)

## 2014-10-13 LAB — PROTIME-INR
INR: 2.19 — ABNORMAL HIGH (ref 0.00–1.49)
Prothrombin Time: 24.1 seconds — ABNORMAL HIGH (ref 11.6–15.2)

## 2014-10-13 LAB — BRAIN NATRIURETIC PEPTIDE: B Natriuretic Peptide: 70.7 pg/mL (ref 0.0–100.0)

## 2014-10-13 MED ORDER — FENTANYL CITRATE (PF) 100 MCG/2ML IJ SOLN
25.0000 ug | INTRAMUSCULAR | Status: AC | PRN
  Administered 2014-10-13 (×2): 25 ug via INTRAVENOUS
  Filled 2014-10-13 (×2): qty 2

## 2014-10-13 MED ORDER — MORPHINE SULFATE (PF) 4 MG/ML IV SOLN
4.0000 mg | INTRAVENOUS | Status: DC | PRN
  Filled 2014-10-13: qty 1

## 2014-10-13 MED ORDER — IOHEXOL 350 MG/ML SOLN
100.0000 mL | Freq: Once | INTRAVENOUS | Status: AC | PRN
  Administered 2014-10-13: 100 mL via INTRAVENOUS

## 2014-10-13 NOTE — Telephone Encounter (Signed)
Spoke w/ pt's grdaughter, pt is asleep after using the gel, she is instructed to call 911 if pt becomes worse per dr Eppie Gibson, she is agreeable

## 2014-10-13 NOTE — ED Notes (Signed)
Per EMS # 100- Pt fell one week ago. Seen on Wednesday, xray negative, C/o r/flank pain since she fell. Pt stated that she was advised to call for transport to ED for persistent pain. Pt is alert, oriented, and appropriate,Pt is  using O2  2l as need. C/o rib pain with cough.

## 2014-10-13 NOTE — ED Notes (Signed)
[facility] Expected date:  Expected time:  Means of arrival:  Comments: 74M gen weakness/shob w/exertion

## 2014-10-13 NOTE — Telephone Encounter (Signed)
Pt called requesting the nurse to call back.

## 2014-10-13 NOTE — ED Provider Notes (Signed)
CSN: [id number]     Arrival date & time 10/13/14  1802 History   First MD Initiated Contact with Patient 10/13/14 1824     Chief Complaint  Patient presents with  . Back Pain    pain in right lower thoracic area x 2 weeks      HPI Pt was seen at 1845. Per pt, c/o gradual onset and persistence of constant right sided posterior ribs "pain" for the past 2 weeks. Pt states she fell 3-4 weeks ago, but "felt fine" afterwards. Pt states she has been falling asleep in her chair, leaning the right side of her body over the chair rail, for the past 2 weeks. Pt has been evaluated by her PMD several times for this complaint, with adjustments in her chronic pain meds. Pt states "nothing's working" for her pain, so she came to the ED "to get admitted." Pt denies new fall, no worsening SOB from baseline, no CP/palpitations, no low back pain, no dysuria/hematuria, no fevers, no focal motor weakness, no tingling/numbness in extremities.     Past Medical History  Diagnosis Date  . COPD (chronic obstructive pulmonary disease) with emphysema     PFTs 02/2012: FEV1 0.92 (40%), ratio 69, 27% increase in FEV1 with BD, TLC 91%, severe airtrapping, DLCO49% On chronic home O2. Pulmonary rehab referral 05/2012   . Moderate to severe pulmonary hypertension     2014 TEE w PA peak pressure 46 mmHg, s/p MV replacement   . Tobacco abuse 07/28/2012  . Hyperlipidemia LDL goal < 100 11/20/2005  . Carotid artery stenosis     s/p right endarterectomy (06/2010) Carotid US (07/2010):  Left: Moderate-to-severe (60-79%) calcific and non-calcific plaque origin and proximal ICA and ECA   . Mitral stenosis     s/p Mitral valve replacement with a 27-mm pericardial porcine valve (Medtronic Mosaic valve, serial #[id number] on 09/20/10, Dr. Prescott Gum)   . Chronic congestive heart failure with left ventricular diastolic dysfunction 07/24/4313  . Depression 11/19/2005  . Anxiety 07/24/2010  . Fibromyalgia 08/29/2010  . Obesity (BMI 30.0-34.9)  10/23/2011  . Adenomatous polyps 05/14/2011    Colonoscopy (05/2011): 4 mm adenomatous polyp excised endoscopically Colonoscopy (02/2002): Adenomatous polyp excised endoscopically   . Gastroesophageal reflux disease   . Internal hemorrhoids 08/04/2012  . Osteoporosis     DEXA (12/09/2011): L-spine T -3.7, left hip T -1.4 DEXA (12/2004): L-spine T -2.6, left hip -0.1   . Allergic rhinitis 06/01/2012  . Bilateral cataracts 08/04/2012    Visually insignificant   . Chronic constipation 02/03/2011  . Chronic venous insufficiency 08/04/2012  . Paroxysmal atrial fibrillation 10/22/2010    s/p Left atrial maze procedure for paroxysmal atrial fibrillation on 09/20/2010 by Dr Prescott Gum.  Subsequent splenic infarct, decision was made to re-anticoagulate with coumadin, likely life-long as this is the most likely cause of the splenic infarct.   . Chronic low back pain 10/06/2012  . Anemia of chronic disease 01/01/2013  . Obstructive sleep apnea     Nocturnal polysomnography (06/2009): Moderate sleep apnea/ hypopnea syndrome , AHI 17.8 per hour with nonpositional hypopneas. CPAP titration to 12 CWP, AHI 2.4 per hour. On nocturnal CPAP via a small resMed Quattro full-face mask with heated humidifier.   . Type 2 diabetes mellitus with diabetic neuropathy   . History of blood transfusion     "several times"   . Clear cell renal cell carcinoma 07/21/2011    s/p cryoablation of left RCC in 09/2011 by [name]. Followed by Dr.  Dahlstedt  [facility]) .    Marland Kitchen Chronic daily headache 01/16/2014  . Right nephrolithiasis 09/06/2014    5 mm non-obstructing calculus seen on CT scan 09/05/2014    Past Surgical History  Procedure Laterality Date  . Orif clavicle fracture  01/2004    by Thana Farr. [name], M.D for Right clavicle nonunion.  . Tonsillectomy    . Carotid endarterectomy Right 07/04/2010    by [name] for asymptomatic right carotid artery stenosis  . Lipoma excision  08/2005    occipital lipoma 1.5cm - by  Dr. [name]  . Hysteroscopy with endometrial ablation  06/2001    for persistent post-menopausal bleeding // by S. [name], M.D.  . Mitral valve replacement  09/20/10     with a 27-mm pericardial porcine valve (Medtronic Mosaic valve, serial #[id number]). 09/20/10, Dr Prescott Gum  . Maze Left 09/20/10    for paroxysmal atrial fibrillation (Dr. Prescott Gum)  . Chest tube insertion  09/24/2010    Dr Prescott Gum  . Cardiac valve replacement  Aug. 2012    "mitral valve"  . Colonoscopy  05/12/2011    performed by Dr. Michail Sermon. Showing small internal hemorrhoids, single tubular adenoma polyp  . Esophagogastroduodenoscopy  05/12/2011    performed by Dr. Michail Sermon. Negative for ulcerations, biopsy negative for evidence of celiac sprue  . Tubal ligation    . Eye surgery    . Fracture surgery Right 2005    clavicle  . Dilation and curettage of uterus    . Cryoablation Left 09/2011    by [name]. Followed by [name]  [facility]) .     Family History  Problem Relation Age of Onset  . Peptic Ulcer Disease Father   . Heart attack Father 76    Died of MI at age 71  . Heart attack Brother 49    Died of MI at age 66  . Obesity Brother   . Pneumonia Mother   . Healthy Sister   . Lupus Daughter   . Obsessive Compulsive Disorder Daughter    Social History  Substance Use Topics  . Smoking status: Current Every Day Smoker -- 1.50 packs/day for 49 years    Types: Cigarettes    Start date: 10/31/2013    Last Attempt to Quit: 10/31/2013  . Smokeless tobacco: Never Used     Comment: 1 1/2  PPD down to 1 ppd  . Alcohol Use: No    Review of Systems ROS: Statement: All systems negative except as marked or noted in the HPI; Constitutional: Negative for fever and chills. ; ; Eyes: Negative for eye pain, redness and discharge. ; ; ENMT: Negative for ear pain, hoarseness, nasal congestion, sinus pressure and sore throat. ; ; Cardiovascular: Negative for chest pain, palpitations, diaphoresis,  dyspnea and peripheral edema. ; ; Respiratory: Negative for cough, wheezing and stridor. ; ; Gastrointestinal: Negative for nausea, vomiting, diarrhea, abdominal pain, blood in stool, hematemesis, jaundice and rectal bleeding. . ; ; Genitourinary: Negative for dysuria, flank pain and hematuria. ; ; Musculoskeletal: +right sided back pain. Negative for neck pain. Negative for swelling and deformity.; ; Skin: Negative for pruritus, rash, abrasions, blisters, bruising and skin lesion.; ; Neuro: Negative for headache, lightheadedness and neck stiffness. Negative for weakness, altered level of consciousness , altered mental status, extremity weakness, paresthesias, involuntary movement, seizure and syncope.      Allergies  Lorazepam; Morphine and related; Oxycontin; and Tramadol hcl  Home Medications   Prior to  Admission medications   Medication Sig Start Date End Date Taking? Authorizing Provider  albuterol (PROAIR HFA) 108 (90 BASE) MCG/ACT inhaler Inhale 2 puffs into the lungs every 6 (six) hours as needed for wheezing or shortness of breath. 01/11/14  Yes [name], MD  ALPRAZolam [name]) 1 MG tablet Take 0.5-1 tablets (0.5-1 mg total) by mouth 2 (two) times daily as needed for anxiety. for anxiety 05/23/14  Yes [name], MD  buPROPion [facility]) 100 MG tablet Take 1 tablet (100 mg total) by mouth 2 (two) times daily. 10/12/14  Yes [name], MD  Calcium Carb-Cholecalciferol 600-800 MG-UNIT TABS Take 1 tablet by mouth 2 (two) times daily.  08/04/12  Yes [name], MD  fluticasone [facility]) 50 MCG/ACT nasal spray Place 2 sprays into both nostrils daily. 05/23/14  Yes [name], MD  Fluticasone-Salmeterol (ADVAIR DISKUS) 250-50 MCG/DOSE AEPB Inhale 1 puff into the lungs 2 (two) times daily. 05/23/14  Yes [name], MD  furosemide (LASIX) 80 MG tablet Take 1 tablet (80 mg total) by mouth 2 (two) times daily. 07/19/14  Yes [name], MD  gabapentin (NEURONTIN) 300 MG capsule  Take 1 capsule (300 mg total) by mouth 3 (three) times daily. 10/06/14  Yes [name], MD  HYDROcodone-acetaminophen (NORCO/VICODIN) 5-325 MG per tablet Take 1-2 tablets by mouth every 6 (six) hours as needed for severe pain. 09/08/14  Yes [name], MD  ibuprofen (ADVIL,MOTRIN) 200 MG tablet Take 400 mg by mouth every 6 (six) hours as needed for moderate pain.   Yes Historical Provider, MD  insulin aspart (NOVOLOG) cartridge Inject 18 Units into the skin 3 (three) times daily with meals. Patient taking differently: Inject 18 Units into the skin 3 (three) times daily with meals. 18 units plus sliding scale 02/17/14  Yes [name], MD  Insulin Detemir (LEVEMIR) 100 UNIT/ML Pen Inject 45 Units into the skin 2 (two) times daily. 04/05/14  Yes [name], MD  lidocaine (XYLOCAINE) 2 % jelly Apply 1 application topically 3 (three) times daily. Apply gel to the affected area three times daily. 10/12/14  Yes Carly Montey Hora, MD  nystatin cream (MYCOSTATIN) Apply 1 application topically 2 (two) times daily. 02/17/14  Yes [name], MD  omeprazole (PRILOSEC) 40 MG capsule Take 1 capsule (40 mg total) by mouth 2 (two) times daily. 06/28/14 06/28/15 Yes Karlene Einstein, MD  potassium chloride SA (K-DUR,KLOR-CON) 20 MEQ tablet Take 1 tablet (20 mEq total) by mouth 2 (two) times daily. 11/14/13  Yes [name], MD  rosuvastatin (CRESTOR) 20 MG tablet Take 1 tablet (20 mg total) by mouth at bedtime. 10/12/14  Yes [name], MD  sertraline (ZOLOFT) 100 MG tablet Take 2 tablets (200 mg total) by mouth daily. 10/12/14  Yes [name], MD  tiotropium (SPIRIVA HANDIHALER) 18 MCG inhalation capsule Place 1 capsule (18 mcg total) into inhaler and inhale daily. 10/12/14  Yes [name], MD  warfarin (COUMADIN) 5 MG tablet Take 1/2 tablet (2.5 mg) every day except on Mondays take 1 tablet (5 mg) 09/21/14  Yes [name], MD  Blood Glucose Monitoring Suppl (ONETOUCH VERIO) W/DEVICE KIT 1 each by Does  not apply route 4 (four) times daily. 07/25/14   [name], MD  chlorpheniramine (CHLOR-TRIMETON) 4 MG tablet Take 1 tablet (4 mg total) by mouth 3 (three) times daily as needed for allergies. 10/06/14   [name], MD  EASY TOUCH PEN NEEDLES 31G X 5 MM MISC USE TO INJECT INSULIN 5 TIMES DAILY 08/23/14  [name], MD  fluconazole (DIFLUCAN) 150 MG tablet Take 1 tablet (150 mg total) by mouth once. Patient not taking: Reported on 10/13/2014 08/23/14   [name], MD  Lancets Misc. (ACCU-CHEK FASTCLIX LANCET) KIT Check your blood 4 times a day dx code 250.00 insulin requiring 01/19/13   [name], MD  [facility] VERIO test strip Check blood sugar up to 4 times a day 07/25/14   [name], MD   BP 156/46 mmHg  Pulse 72  Temp(Src) 98.1 F (36.7 C) (Oral)  Resp 18  SpO2 96% Physical Exam  1850: Physical examination:  Nursing notes reviewed; Vital signs and O2 SAT reviewed;  Constitutional: Well developed, Well nourished, Well hydrated, In no acute distress; Head:  Normocephalic, atraumatic; Eyes: EOMI, PERRL, No scleral icterus; ENMT: Mouth and pharynx normal, Mucous membranes moist; Neck: Supple, Full range of motion, No lymphadenopathy; Cardiovascular: Regular rate and rhythm, No gallop; Respiratory: Breath sounds clear & equal bilaterally, No rales, rhonchi, wheezes.  Speaking full sentences with ease, Normal respiratory effort/excursion; Chest: Nontender, Movement normal; Abdomen: Soft, Nontender, Nondistended, Normal bowel sounds; Spine:  No midline CS, TS, LS tenderness. +TTP right posterior-lateral lower ribs. No rash, no ecchymosis, no soft tissue crepitus.;; Genitourinary: No CVA tenderness. No flank ecchymosis.; Extremities: Pulses normal, No tenderness, No edema, No calf edema or asymmetry.; Neuro: AA&Ox3, Major CN grossly intact.  Speech clear. No gross focal motor deficits in extremities.; Skin: Color normal, Warm, Dry.   ED Course  Procedures (including critical care  time) Labs Review   Imaging Review  I have personally reviewed and evaluated these images and lab results as part of my medical decision-making.   EKG Interpretation None      MDM  MDM Reviewed: previous chart, nursing note and vitals Reviewed previous: labs and x-ray Interpretation: labs and CT scan      Results for orders placed or performed during the hospital encounter of 10/13/14  Brain natriuretic peptide  Result Value Ref Range   B Natriuretic Peptide 70.7 0.0 - 100.0 pg/mL  Basic metabolic panel  Result Value Ref Range   Sodium 140 135 - 145 mmol/L   Potassium 4.2 3.5 - 5.1 mmol/L   Chloride 101 101 - 111 mmol/L   CO2 28 22 - 32 mmol/L   Glucose, Bld 171 (H) 65 - 99 mg/dL   BUN 17 6 - 20 mg/dL   Creatinine, Ser 1.03 (H) 0.44 - 1.00 mg/dL   Calcium 9.2 8.9 - 10.3 mg/dL   GFR calc non Af Amer 56 (L) >60 mL/min   GFR calc Af Amer >60 >60 mL/min   Anion gap 11 5 - 15  CBC with Differential  Result Value Ref Range   WBC 9.7 4.0 - 10.5 K/uL   RBC 3.71 (L) 3.87 - 5.11 MIL/uL   Hemoglobin 10.3 (L) 12.0 - 15.0 g/dL   HCT 34.0 (L) 36.0 - 46.0 %   MCV 91.6 78.0 - 100.0 fL   MCH 27.8 26.0 - 34.0 pg   MCHC 30.3 30.0 - 36.0 g/dL   RDW 16.7 (H) 11.5 - 15.5 %   Platelets 247 150 - 400 K/uL   Neutrophils Relative % 80 (H) 43 - 77 %   Neutro Abs 7.7 1.7 - 7.7 K/uL   Lymphocytes Relative 11 (L) 12 - 46 %   Lymphs Abs 1.1 0.7 - 4.0 K/uL   Monocytes Relative 8 3 - 12 %   Monocytes Absolute 0.8 0.1 - 1.0 K/uL   Eosinophils  Relative 1 0 - 5 %   Eosinophils Absolute 0.1 0.0 - 0.7 K/uL   Basophils Relative 0 0 - 1 %   Basophils Absolute 0.0 0.0 - 0.1 K/uL  Urinalysis, Routine w reflex microscopic  Result Value Ref Range   Color, Urine STRAW (A) YELLOW   APPearance CLEAR CLEAR   Specific Gravity, Urine 1.008 1.005 - 1.030   pH 5.5 5.0 - 8.0   Glucose, UA NEGATIVE NEGATIVE mg/dL   Hgb urine dipstick NEGATIVE NEGATIVE   Bilirubin Urine NEGATIVE NEGATIVE   Ketones, ur  NEGATIVE NEGATIVE mg/dL   Protein, ur NEGATIVE NEGATIVE mg/dL   Urobilinogen, UA 0.2 0.0 - 1.0 mg/dL   Nitrite NEGATIVE NEGATIVE   Leukocytes, UA NEGATIVE NEGATIVE  Protime-INR  Result Value Ref Range   Prothrombin Time 24.1 (H) 11.6 - 15.2 seconds   INR 2.19 (H) 0.00 - 1.49   *Note: Due to a large number of results and/or encounters for the requested time period, some results have not been displayed. A complete set of results can be found in Results Review.   Dg Ribs Unilateral W/chest Right 10/13/2014   CLINICAL DATA:  Status post fall, with right flank pain. Initial encounter.  EXAM: RIGHT RIBS AND CHEST - 3+ VIEW  COMPARISON:  Chest and right rib radiographs performed 10/11/2014  FINDINGS: There is a minimally displaced fracture of the right eighth posterolateral rib.  The lungs are well-aerated. Mild vascular congestion is noted. There is no evidence of focal opacification, pleural effusion or pneumothorax.  The cardiomediastinal silhouette is borderline normal in size. The patient is status post median sternotomy. No acute osseous abnormalities are seen. A plate and screws are noted along the right clavicle.  IMPRESSION: 1. Minimally displaced fracture of the right eighth posterolateral rib. 2. Mild vascular congestion noted.  Lungs remain grossly clear.   Electronically Signed   By: Garald Balding M.D.   On: 10/13/2014 21:33   Ct Angio Chest Pe W/cm &/or Wo Cm 10/13/2014   CLINICAL DATA:  Chest pain and cough.  EXAM: CT ANGIOGRAPHY CHEST WITH CONTRAST  TECHNIQUE: Multidetector CT imaging of the chest was performed using the standard protocol during bolus administration of intravenous contrast. Multiplanar CT image reconstructions and MIPs were obtained to evaluate the vascular anatomy.  CONTRAST:  149m OMNIPAQUE IOHEXOL 350 MG/ML SOLN  COMPARISON:  09/16/2010  FINDINGS: Cardiovascular: There is good opacification of the pulmonary arteries. There is no pulmonary embolism. The thoracic aorta is  normal in caliber and intact.  Lungs: Centrilobular emphysematous changes are present, upper lobe predominant. Minimal interstitial coarsening in the lateral periphery of the inferior lower lobes, without confluent airspace consolidation.  Central airways: Patent  Effusions: None  Lymphadenopathy: Small nodes in the mediastinum and both hila, substantially decreased from 09/16/2010 and nonspecific.  Esophagus: Unremarkable  Upper abdomen: Unremarkable  Musculoskeletal: There are mildly displaced fractures of the right sixth and eighth ribs posteriorly and posterolaterally. No significant musculoskeletal lesion is evident.  Review of the MIP images confirms the above findings.  IMPRESSION: 1. Negative for pulmonary embolism 2. Emphysematous changes.  Mild peripheral interstitial coarsening. 3. Acute mildly displaced right sixth and right eighth rib fractures.   Electronically Signed   By: DAndreas NewportM.D.   On: 10/13/2014 23:19    0010:  Pt has received multiple doses of IV fentanyl without significant improvement in her pain. H/H at baseline. INR is therapeutic. CT scan with 2 rib fx.  Dx and testing d/w pt.  Questions answered.  Verb understanding, agreeable to admit. T/C to [facility] Resident, case discussed, including:  HPI, pertinent PM/SHx, VS/PE, dx testing, ED course and treatment:  Agreeable to accept transfer/admit, requests to write temporary orders, obtain medical bed to Dr. Doristine Section service.   Francine Graven, DO 10/17/14 1731

## 2014-10-13 NOTE — Telephone Encounter (Signed)
Agree with the advice provided to [name].  If lidocaine patch is ineffective in controlling her pain she should present to the ED over the weekend for re-evaluation since we apparently have not found an explanation for this pain as of yet.

## 2014-10-13 NOTE — Telephone Encounter (Signed)
Called pt backand as stated before pt is not doing any better, she is after stating that she cannot come to an appt this pm or tues due to transportation issues to call 911 and come to ED, she also states she is to get the lidocaine jelly today between 1430 and 1530, it is sugested that she try using that and then come to the ED just in case that might help the pain ease up, she is agreeable with this plan.

## 2014-10-13 NOTE — ED Notes (Signed)
Bed: WA06 Expected date:  Expected time:  Means of arrival:  Comments: EMS- 64yo F, fall, flank pain

## 2014-10-13 NOTE — Telephone Encounter (Signed)
Dr Arcelia Jew, pt calls and states her pain is worse this afternoon than usual, she has tried heat, rest and the pain med with no relief,

## 2014-10-14 DIAGNOSIS — G8929 Other chronic pain: Secondary | ICD-10-CM | POA: Diagnosis present

## 2014-10-14 DIAGNOSIS — F17210 Nicotine dependence, cigarettes, uncomplicated: Secondary | ICD-10-CM | POA: Diagnosis present

## 2014-10-14 DIAGNOSIS — Z888 Allergy status to other drugs, medicaments and biological substances status: Secondary | ICD-10-CM | POA: Diagnosis not present

## 2014-10-14 DIAGNOSIS — Y92009 Unspecified place in unspecified non-institutional (private) residence as the place of occurrence of the external cause: Secondary | ICD-10-CM | POA: Diagnosis not present

## 2014-10-14 DIAGNOSIS — Z79899 Other long term (current) drug therapy: Secondary | ICD-10-CM | POA: Diagnosis not present

## 2014-10-14 DIAGNOSIS — S2241XA Multiple fractures of ribs, right side, initial encounter for closed fracture: Principal | ICD-10-CM

## 2014-10-14 DIAGNOSIS — I5032 Chronic diastolic (congestive) heart failure: Secondary | ICD-10-CM

## 2014-10-14 DIAGNOSIS — Z794 Long term (current) use of insulin: Secondary | ICD-10-CM

## 2014-10-14 DIAGNOSIS — F329 Major depressive disorder, single episode, unspecified: Secondary | ICD-10-CM

## 2014-10-14 DIAGNOSIS — I480 Paroxysmal atrial fibrillation: Secondary | ICD-10-CM

## 2014-10-14 DIAGNOSIS — I270 Primary pulmonary hypertension: Secondary | ICD-10-CM

## 2014-10-14 DIAGNOSIS — R52 Pain, unspecified: Secondary | ICD-10-CM | POA: Diagnosis present

## 2014-10-14 DIAGNOSIS — Z952 Presence of prosthetic heart valve: Secondary | ICD-10-CM | POA: Diagnosis not present

## 2014-10-14 DIAGNOSIS — Z7901 Long term (current) use of anticoagulants: Secondary | ICD-10-CM

## 2014-10-14 DIAGNOSIS — J439 Emphysema, unspecified: Secondary | ICD-10-CM | POA: Diagnosis present

## 2014-10-14 DIAGNOSIS — M545 Low back pain: Secondary | ICD-10-CM | POA: Diagnosis present

## 2014-10-14 DIAGNOSIS — Z9981 Dependence on supplemental oxygen: Secondary | ICD-10-CM | POA: Diagnosis not present

## 2014-10-14 DIAGNOSIS — M62838 Other muscle spasm: Secondary | ICD-10-CM | POA: Diagnosis present

## 2014-10-14 DIAGNOSIS — G4733 Obstructive sleep apnea (adult) (pediatric): Secondary | ICD-10-CM | POA: Diagnosis present

## 2014-10-14 DIAGNOSIS — E119 Type 2 diabetes mellitus without complications: Secondary | ICD-10-CM

## 2014-10-14 DIAGNOSIS — Z6835 Body mass index (BMI) 35.0-35.9, adult: Secondary | ICD-10-CM | POA: Diagnosis not present

## 2014-10-14 DIAGNOSIS — S2249XA Multiple fractures of ribs, unspecified side, initial encounter for closed fracture: Secondary | ICD-10-CM | POA: Diagnosis present

## 2014-10-14 DIAGNOSIS — I272 Other secondary pulmonary hypertension: Secondary | ICD-10-CM | POA: Diagnosis present

## 2014-10-14 DIAGNOSIS — M810 Age-related osteoporosis without current pathological fracture: Secondary | ICD-10-CM | POA: Diagnosis present

## 2014-10-14 DIAGNOSIS — E669 Obesity, unspecified: Secondary | ICD-10-CM | POA: Diagnosis present

## 2014-10-14 DIAGNOSIS — Z85528 Personal history of other malignant neoplasm of kidney: Secondary | ICD-10-CM | POA: Diagnosis not present

## 2014-10-14 DIAGNOSIS — W07XXXA Fall from chair, initial encounter: Secondary | ICD-10-CM | POA: Diagnosis present

## 2014-10-14 DIAGNOSIS — Z885 Allergy status to narcotic agent status: Secondary | ICD-10-CM | POA: Diagnosis not present

## 2014-10-14 DIAGNOSIS — K219 Gastro-esophageal reflux disease without esophagitis: Secondary | ICD-10-CM

## 2014-10-14 DIAGNOSIS — E1140 Type 2 diabetes mellitus with diabetic neuropathy, unspecified: Secondary | ICD-10-CM | POA: Diagnosis present

## 2014-10-14 DIAGNOSIS — I872 Venous insufficiency (chronic) (peripheral): Secondary | ICD-10-CM | POA: Diagnosis present

## 2014-10-14 LAB — CBC
HCT: 31.8 % — ABNORMAL LOW (ref 36.0–46.0)
Hemoglobin: 9.7 g/dL — ABNORMAL LOW (ref 12.0–15.0)
MCH: 27.8 pg (ref 26.0–34.0)
MCHC: 30.5 g/dL (ref 30.0–36.0)
MCV: 91.1 fL (ref 78.0–100.0)
Platelets: 277 10*3/uL (ref 150–400)
RBC: 3.49 MIL/uL — ABNORMAL LOW (ref 3.87–5.11)
RDW: 16.9 % — ABNORMAL HIGH (ref 11.5–15.5)
WBC: 9.7 10*3/uL (ref 4.0–10.5)

## 2014-10-14 LAB — BASIC METABOLIC PANEL
Anion gap: 9 (ref 5–15)
BUN: 12 mg/dL (ref 6–20)
CO2: 28 mmol/L (ref 22–32)
Calcium: 9.1 mg/dL (ref 8.9–10.3)
Chloride: 98 mmol/L — ABNORMAL LOW (ref 101–111)
Creatinine, Ser: 1.01 mg/dL — ABNORMAL HIGH (ref 0.44–1.00)
GFR calc Af Amer: >60 mL/min (ref >60)
GFR calc non Af Amer: 57 mL/min — ABNORMAL LOW (ref >60)
Glucose, Bld: 155 mg/dL — ABNORMAL HIGH (ref 65–99)
Potassium: 3.8 mmol/L (ref 3.5–5.1)
Sodium: 135 mmol/L (ref 135–145)

## 2014-10-14 LAB — GLUCOSE, CAPILLARY
Glucose-Capillary: 169 mg/dL — ABNORMAL HIGH (ref 65–99)
Glucose-Capillary: 199 mg/dL — ABNORMAL HIGH (ref 65–99)
Glucose-Capillary: 163 mg/dL — ABNORMAL HIGH (ref 65–99)
Glucose-Capillary: 243 mg/dL — ABNORMAL HIGH (ref 65–99)
Glucose-Capillary: 197 mg/dL — ABNORMAL HIGH (ref 65–99)

## 2014-10-14 MED ORDER — ALBUTEROL SULFATE (2.5 MG/3ML) 0.083% IN NEBU
2.5000 mg | INHALATION_SOLUTION | Freq: Four times a day (QID) | RESPIRATORY_TRACT | Status: DC | PRN
  Administered 2014-10-14 – 2014-10-15 (×3): 2.5 mg via RESPIRATORY_TRACT
  Filled 2014-10-14 (×3): qty 3

## 2014-10-14 MED ORDER — FUROSEMIDE 40 MG PO TABS
80.0000 mg | ORAL_TABLET | Freq: Two times a day (BID) | ORAL | Status: DC
  Administered 2014-10-14 – 2014-10-16 (×5): 80 mg via ORAL
  Filled 2014-10-14 (×5): qty 2

## 2014-10-14 MED ORDER — SERTRALINE HCL 100 MG PO TABS
200.0000 mg | ORAL_TABLET | Freq: Every day | ORAL | Status: DC
  Administered 2014-10-14 – 2014-10-16 (×3): 200 mg via ORAL
  Filled 2014-10-14 (×3): qty 2

## 2014-10-14 MED ORDER — BUPROPION HCL 100 MG PO TABS
100.0000 mg | ORAL_TABLET | Freq: Two times a day (BID) | ORAL | Status: DC
  Administered 2014-10-14 – 2014-10-16 (×5): 100 mg via ORAL
  Filled 2014-10-14 (×8): qty 1

## 2014-10-14 MED ORDER — CYCLOBENZAPRINE HCL 10 MG PO TABS
5.0000 mg | ORAL_TABLET | Freq: Three times a day (TID) | ORAL | Status: DC | PRN
  Administered 2014-10-14 (×2): 5 mg via ORAL
  Filled 2014-10-14 (×2): qty 1

## 2014-10-14 MED ORDER — NICOTINE 14 MG/24HR TD PT24
14.0000 mg | MEDICATED_PATCH | Freq: Every day | TRANSDERMAL | Status: DC
  Administered 2014-10-14 – 2014-10-16 (×3): 14 mg via TRANSDERMAL
  Filled 2014-10-14 (×3): qty 1

## 2014-10-14 MED ORDER — WARFARIN SODIUM 5 MG PO TABS: 2.5000 mg | ORAL_TABLET | Freq: Every day | ORAL | Status: DC

## 2014-10-14 MED ORDER — IPRATROPIUM-ALBUTEROL 0.5-2.5 (3) MG/3ML IN SOLN
3.0000 mL | RESPIRATORY_TRACT | Status: DC | PRN
  Administered 2014-10-15 (×2): 3 mL via RESPIRATORY_TRACT
  Filled 2014-10-14 (×2): qty 3

## 2014-10-14 MED ORDER — WARFARIN SODIUM 5 MG PO TABS: 5.0000 mg | ORAL_TABLET | ORAL | Status: DC

## 2014-10-14 MED ORDER — ROSUVASTATIN CALCIUM 10 MG PO TABS
20.0000 mg | ORAL_TABLET | Freq: Every day | ORAL | Status: DC
  Administered 2014-10-14 – 2014-10-15 (×2): 20 mg via ORAL
  Filled 2014-10-14 (×2): qty 2

## 2014-10-14 MED ORDER — GABAPENTIN 300 MG PO CAPS
300.0000 mg | ORAL_CAPSULE | Freq: Three times a day (TID) | ORAL | Status: DC
  Administered 2014-10-14 – 2014-10-16 (×7): 300 mg via ORAL
  Filled 2014-10-14 (×7): qty 1

## 2014-10-14 MED ORDER — HYDROCODONE-ACETAMINOPHEN 5-325 MG PO TABS
1.0000 | ORAL_TABLET | Freq: Four times a day (QID) | ORAL | Status: DC | PRN
  Administered 2014-10-14 – 2014-10-15 (×4): 2 via ORAL
  Filled 2014-10-14 (×4): qty 2

## 2014-10-14 MED ORDER — WARFARIN - PHYSICIAN DOSING INPATIENT: Freq: Every day | Status: DC

## 2014-10-14 MED ORDER — FENTANYL CITRATE (PF) 100 MCG/2ML IJ SOLN
50.0000 ug | INTRAMUSCULAR | Status: DC | PRN
  Administered 2014-10-14: 50 ug via INTRAVENOUS
  Filled 2014-10-14: qty 2

## 2014-10-14 MED ORDER — INSULIN ASPART 100 UNIT/ML ~~LOC~~ SOLN
0.0000 [IU] | Freq: Three times a day (TID) | SUBCUTANEOUS | Status: DC
  Administered 2014-10-14: 5 [IU] via SUBCUTANEOUS
  Administered 2014-10-14 – 2014-10-15 (×3): 3 [IU] via SUBCUTANEOUS
  Administered 2014-10-15: 2 [IU] via SUBCUTANEOUS
  Administered 2014-10-15 – 2014-10-16 (×3): 3 [IU] via SUBCUTANEOUS

## 2014-10-14 MED ORDER — PANTOPRAZOLE SODIUM 40 MG PO TBEC
80.0000 mg | DELAYED_RELEASE_TABLET | Freq: Every day | ORAL | Status: DC
  Administered 2014-10-14 – 2014-10-16 (×3): 80 mg via ORAL
  Filled 2014-10-14 (×3): qty 2

## 2014-10-14 MED ORDER — LIDOCAINE 5 % EX PTCH
1.0000 | MEDICATED_PATCH | CUTANEOUS | Status: DC
  Administered 2014-10-14 – 2014-10-16 (×3): 1 via TRANSDERMAL
  Filled 2014-10-14 (×6): qty 1

## 2014-10-14 MED ORDER — LIDOCAINE 4 % EX CREA
TOPICAL_CREAM | CUTANEOUS | Status: DC | PRN
  Filled 2014-10-14: qty 5

## 2014-10-14 MED ORDER — INSULIN DETEMIR 100 UNIT/ML ~~LOC~~ SOLN
30.0000 [IU] | Freq: Two times a day (BID) | SUBCUTANEOUS | Status: DC
  Administered 2014-10-14 – 2014-10-16 (×5): 30 [IU] via SUBCUTANEOUS
  Filled 2014-10-14 (×6): qty 0.3

## 2014-10-14 MED ORDER — HYDROMORPHONE HCL 1 MG/ML IJ SOLN
0.5000 mg | INTRAMUSCULAR | Status: DC | PRN
  Administered 2014-10-14 (×2): 0.5 mg via INTRAVENOUS
  Filled 2014-10-14 (×2): qty 1

## 2014-10-14 MED ORDER — ALBUTEROL SULFATE (2.5 MG/3ML) 0.083% IN NEBU: 2.5000 mg | INHALATION_SOLUTION | RESPIRATORY_TRACT | Status: DC | PRN

## 2014-10-14 MED ORDER — POTASSIUM CHLORIDE CRYS ER 20 MEQ PO TBCR
20.0000 meq | EXTENDED_RELEASE_TABLET | Freq: Two times a day (BID) | ORAL | Status: DC
  Administered 2014-10-14 – 2014-10-16 (×5): 20 meq via ORAL
  Filled 2014-10-14 (×5): qty 1

## 2014-10-14 MED ORDER — WARFARIN SODIUM 5 MG PO TABS
2.5000 mg | ORAL_TABLET | ORAL | Status: DC
  Administered 2014-10-14: 2.5 mg via ORAL
  Filled 2014-10-14: qty 1

## 2014-10-14 MED ORDER — HYDROMORPHONE HCL 1 MG/ML IJ SOLN
0.5000 mg | INTRAMUSCULAR | Status: DC | PRN
  Administered 2014-10-14 – 2014-10-15 (×4): 0.5 mg via INTRAVENOUS
  Filled 2014-10-14 (×4): qty 1

## 2014-10-14 NOTE — Progress Notes (Signed)
Subjective: 64 y.o. female admitted for rib fracture with x-ray showing single 8th rib fracture on the right.  Subsequent CT scan last night in the ED showed 6th and 8th rib fractures on the right.  Patient admitted overnight for pain control 2/2 rib fractures uncontrolled on her home dosing of Vicodin 5-325, intermittent ibuprofen use, and Capsaicin cream.  This morning, she is still having significant pain exacerbated by movement, cough, and muscle spasm.   Denies any other complaints at this time.  Also, requesting her nicotine patch as she is a current 1.5 PPD smoker. Objective: Vital signs in last 24 hours: Filed Vitals:   10/14/14 0031 10/14/14 0127 10/14/14 0157 10/14/14 0601  BP: 123/46 157/39  140/96  Pulse: 70 78  80  Temp: 99.3 F (37.4 C) 99.6 F (37.6 C)  98.4 F (36.9 C)  TempSrc: Oral Oral  Oral  Resp: _0 Height:   _1  (1.575 m)   Weight:   192 lb 1.4 oz (87.13 kg)   SpO2: 93% 97%  94%   Weight change:   Intake/Output Summary (Last 24 hours) at 10/14/14 0923 Last data filed at 10/14/14 0128  Gross per 24 hour  Intake      0 ml  Output      1 ml  Net     -1 ml   General: sitting up in a chair with pillow underneath her arm, mild distress 2/2 pain HEENT: EOMI, no scleral icterus Cardiac: RRR, no rubs, murmurs or gallops.  Tender to palpation over her right rib area. Pulm: clear to auscultation bilaterally, diminished movement of air Abd: soft, nontender, nondistended, BS present Ext: warm and well perfused, no pedal edema Neuro: alert and oriented X3, cranial nerves II-XII grossly intact  Lab Results: Basic Metabolic Panel:  Recent Labs Lab 10/13/14 1959 10/14/14 0734  NA 140 135  K 4.2 3.8  CL 101 98*  CO2 28 28  GLUCOSE 171* 155*  BUN 17 12  CREATININE 1.03* 1.01*  CALCIUM 9.2 9.1   CBC:  Recent Labs Lab 10/13/14 1959 10/14/14 0734  WBC 9.7 9.7  NEUTROABS 7.7  --   HGB 10.3* 9.7*  HCT 34.0* 31.8*  MCV 91.6 91.1  PLT 247 277     CBG:  Recent Labs Lab 10/11/14 1536 10/14/14 0642 10/14/14 0759  GLUCAP 191* 169* 199*   Coagulation:  Recent Labs Lab 10/13/14 1959  LABPROT 24.1*  INR 2.19*   Urinalysis:  Recent Labs Lab 10/13/14 1958  COLORURINE STRAW*  LABSPEC 1.008  PHURINE 5.5  GLUCOSEU NEGATIVE  HGBUR NEGATIVE  BILIRUBINUR NEGATIVE  KETONESUR NEGATIVE  PROTEINUR NEGATIVE  UROBILINOGEN 0.2  NITRITE NEGATIVE  LEUKOCYTESUR NEGATIVE   Studies/Results: Dg Ribs Unilateral W/chest Right  10/13/2014   CLINICAL DATA:  Status post fall, with right flank pain. Initial encounter.  EXAM: RIGHT RIBS AND CHEST - 3+ VIEW  COMPARISON:  Chest and right rib radiographs performed 10/11/2014  FINDINGS: There is a minimally displaced fracture of the right eighth posterolateral rib.  The lungs are well-aerated. Mild vascular congestion is noted. There is no evidence of focal opacification, pleural effusion or pneumothorax.  The cardiomediastinal silhouette is borderline normal in size. The patient is status post median sternotomy. No acute osseous abnormalities are seen. A plate and screws are noted along the right clavicle.  IMPRESSION: 1. Minimally displaced fracture of the right eighth posterolateral rib. 2. Mild vascular congestion noted.  Lungs remain grossly clear.  Electronically Signed   By: Garald Balding M.D.   On: 10/13/2014 21:33   Ct Angio Chest Pe W/cm &/or Wo Cm  10/13/2014   CLINICAL DATA:  Chest pain and cough.  EXAM: CT ANGIOGRAPHY CHEST WITH CONTRAST  TECHNIQUE: Multidetector CT imaging of the chest was performed using the standard protocol during bolus administration of intravenous contrast. Multiplanar CT image reconstructions and MIPs were obtained to evaluate the vascular anatomy.  CONTRAST:  111m OMNIPAQUE IOHEXOL 350 MG/ML SOLN  COMPARISON:  09/16/2010  FINDINGS: Cardiovascular: There is good opacification of the pulmonary arteries. There is no pulmonary embolism. The thoracic aorta is normal in  caliber and intact.  Lungs: Centrilobular emphysematous changes are present, upper lobe predominant. Minimal interstitial coarsening in the lateral periphery of the inferior lower lobes, without confluent airspace consolidation.  Central airways: Patent  Effusions: None  Lymphadenopathy: Small nodes in the mediastinum and both hila, substantially decreased from 09/16/2010 and nonspecific.  Esophagus: Unremarkable  Upper abdomen: Unremarkable  Musculoskeletal: There are mildly displaced fractures of the right sixth and eighth ribs posteriorly and posterolaterally. No significant musculoskeletal lesion is evident.  Review of the MIP images confirms the above findings.  IMPRESSION: 1. Negative for pulmonary embolism 2. Emphysematous changes.  Mild peripheral interstitial coarsening. 3. Acute mildly displaced right sixth and right eighth rib fractures.   Electronically Signed   By: DAndreas NewportM.D.   On: 10/13/2014 23:19   Medications:  Scheduled Meds: . buPROPion  100 mg Oral BID WC  . furosemide  80 mg Oral BID  . gabapentin  300 mg Oral TID  . insulin aspart  0-15 Units Subcutaneous TID WC  . insulin detemir  30 Units Subcutaneous BID  . lidocaine  1 patch Transdermal Q24H  . nicotine  14 mg Transdermal Daily  . pantoprazole  80 mg Oral Daily  . potassium chloride SA  20 mEq Oral BID  . rosuvastatin  20 mg Oral QHS  . sertraline  200 mg Oral Daily  . warfarin  2.5 mg Oral Once per day on Sun Tue Wed Thu Fri Sat   And  . [START ON 10/16/2014] warfarin  5 mg Oral Q Mon-1800  . Warfarin - Physician Dosing Inpatient   Does not apply q1800   Continuous Infusions:  PRN Meds:.albuterol, cyclobenzaprine, HYDROcodone-acetaminophen, HYDROmorphone (DILAUDID) injection, ipratropium-albuterol Assessment/Plan: Active Problems:   Rib fractures   Right rib fracture   Fracture of ribs, two, closed   Intractable pain   DM (diabetes mellitus) type 2, uncontrolled, with ketoacidosis   Congestive heart  disease  Acute Right 6th/8th Displaced Rib Fractures: CTA chest showed mildly displaced rib fractures. Likely secondary to osteoporosis. Remote history of fall 4 weeks ago, but patient only developed pain 1 week ago. Patient does have a prior history of fracture secondary to osteoporosis.  -will try to focus on treating her pain with prn dosing of Norco and Flexeril and try to hold Dilaudid as last resort.  Will also provide Lidoderm patch and encourage her to use incentive spirometry -Norco 5-325 1-2 tablets q6h prn -Flexeril 521mPO tid prn -Dilaudid 0.61m17mV q4h prn -Lidoderm patch q12h -incentive spirometry  Paroxysmal Atrial Fibrillation: On Coumadin 2.5 mg daily, but 5 mg on Monday. INR 2.19 on admission. No evidence of bleeding. -Coumadin 2.5 mg daily, except Monday 5 mg   Emphysema/Tobacco Abuse: Patient is on Albuterol, Advair and Spriva at home.  -Supplemental O2 as needed -Albuterol prn -Duoneb q4h prn -Nicotine Patch  Diabetes  mellitus type II: patient is on Levemir 45 units BID and Novolog 18 units with meals. -SSI-M -Levemir 30 units BID -Carb mod diet -Gabapentin 300 mg TID  Chronic dCHF: Not clinically in CHF exacerbation. Patient is on Lasix 80 mg BID and Kdur 20 mEq BID at home. -Continue Lasix and Kdur  Depression: Patient is on Xanax 0.5-1 mg BID prn, wellbutrin 100 mg BID and zoloft 200 mg daily. -Continue Wellbutrin and Zoloft -Holding Xanax.  May need to give her something to help sleep as she reports being unable to sleep 2/2 her pain  GERD: On omeprazole 40 mg bid at home. -Protonix 80 mg daily  Hyperlipidemia: On rosuvastatin 20 mg daily at home. -Continue rosuvastatin  DVT/PE ppx: Coumadin  Diet: Carb mod  Code: Full  Dispo: Disposition is deferred at this time, awaiting improvement of current medical problems.    The patient does have a current PCP [name], MD) and does need an [facility] hospital follow-up appointment after discharge.  The  patient does not have transportation limitations that hinder transportation to clinic appointments.   LOS: 0 days   Jule Ser, DO 10/14/2014, 9:23 AM

## 2014-10-14 NOTE — H&P (Signed)
Date: 10/14/2014               Patient Name:  [name] MRN: [id number]  DOB: Jun 21, 1950 Age / Sex: 64 y.o., female   PCP: [name], MD         Medical Service: Internal Medicine Teaching Service         Attending Physician: Dr. Sid Falcon, MD    First Contact: Dr. Juleen China Pager: [phone]  Second Contact: Dr. Genene Churn Pager: [phone]       After Hours (After 5p/  First Contact Pager: [phone]  weekends / holidays): Second Contact Pager: [phone]   Chief Complaint: fractured rib  History of Present Illness: [name] is a 64 yo female with CHF, paroxysmal AFib, moderate to severe pulmonary HTN, COPD, and osteoporosis, presenting with one week h/o right sided flank and back pain.  She reports that she sustained 2 falls 3 weeks - 1 month ago, falling out of her recliner while asleep both times.  She reports that she slid out while sleeping once, and fell forward while sleeping once.  She denies injury during the first fall, and endorses hitting her head on the second fall, but denies any subsequent bruising.  Now, she complains of right sided flank pain that radiates around to her abdomen and around to her back.  Pain is worse with cough.  She denies any recent trauma immediately prior to her rib pain.  She was seen twice in IM clinic for the pain.  Xray was deferred the first time, with subsequent Xray noting single 8th rib fracture.  CT scan in the ED tonight noted 6th and 8th rib fractures.  She has a h/o rib fractures 2/2 her underlying osteoporosis.  She endorses one episode of loose stool.  She denies fever, chills, CP, SOB, N/V, constipation, abdominal pain, or changes in urinary habits.  Meds: Current Facility-Administered Medications  Medication Dose Route Frequency Provider Last Rate Last Dose  . albuterol (PROVENTIL) (2.5 MG/3ML) 0.083% nebulizer solution 2.5 mg  2.5 mg Inhalation Q6H PRN Alexa Sherral Hammers, MD      . buPROPion [facility]) tablet 100 mg  100 mg Oral  BID WC Alexa Sherral Hammers, MD      . furosemide (LASIX) tablet 80 mg  80 mg Oral BID Alexa Sherral Hammers, MD      . gabapentin (NEURONTIN) capsule 300 mg  300 mg Oral TID Alexa Sherral Hammers, MD      . HYDROcodone-acetaminophen (NORCO/VICODIN) 5-325 MG per tablet 1-2 tablet  1-2 tablet Oral Q6H PRN Alexa Sherral Hammers, MD      . insulin aspart (novoLOG) injection 0-15 Units  0-15 Units Subcutaneous TID WC Alexa Sherral Hammers, MD      . insulin detemir (LEVEMIR) injection 30 Units  30 Units Subcutaneous BID Alexa Sherral Hammers, MD      . ipratropium-albuterol (DUONEB) 0.5-2.5 (3) MG/3ML nebulizer solution 3 mL  3 mL Nebulization Q4H PRN Alexa Sherral Hammers, MD      . lidocaine (LMX) 4 % cream   Topical PRN Alexa Sherral Hammers, MD      . nicotine (NICODERM CQ - dosed in mg/24 hours) patch 14 mg  14 mg Transdermal Daily Alexa Sherral Hammers, MD      . pantoprazole (PROTONIX) EC tablet 80 mg  80 mg Oral Daily Alexa Sherral Hammers, MD      . potassium chloride SA (K-DUR,KLOR-CON) CR tablet 20 mEq  20 mEq Oral BID Alexa Sherral Hammers,  MD      . rosuvastatin (CRESTOR) tablet 20 mg  20 mg Oral QHS Alexa Sherral Hammers, MD      . sertraline (ZOLOFT) tablet 200 mg  200 mg Oral Daily Alexa Sherral Hammers, MD      . warfarin (COUMADIN) tablet 2.5 mg  2.5 mg Oral Once per day on Sun Tue Wed Thu Fri Sat Sid Falcon, MD       And  . Derrill Memo ON 10/16/2014] warfarin (COUMADIN) tablet 5 mg  5 mg Oral Q Mon-1800 Sid Falcon, MD      . Warfarin - Physician Dosing Inpatient   Does not apply [id number] Sid Falcon, MD        Allergies: Allergies as of 10/13/2014 - Review Complete 10/13/2014  Allergen Reaction Noted  . Lorazepam  11/04/2010  . Morphine and related Other (See Comments) 06/27/2013  . Oxycontin [oxycodone] Other (See Comments) 05/03/2012  . Tramadol hcl     Past Medical History  Diagnosis Date  . COPD (chronic obstructive pulmonary disease) with emphysema     PFTs 02/2012: FEV1 0.92 (40%), ratio 69, 27%  increase in FEV1 with BD, TLC 91%, severe airtrapping, DLCO49% On chronic home O2. Pulmonary rehab referral 05/2012   . Moderate to severe pulmonary hypertension     2014 TEE w PA peak pressure 46 mmHg, s/p MV replacement   . Tobacco abuse 07/28/2012  . Hyperlipidemia LDL goal < 100 11/20/2005  . Carotid artery stenosis     s/p right endarterectomy (06/2010) Carotid US (07/2010):  Left: Moderate-to-severe (60-79%) calcific and non-calcific plaque origin and proximal ICA and ECA   . Mitral stenosis     s/p Mitral valve replacement with a 27-mm pericardial porcine valve (Medtronic Mosaic valve, serial #[id number] on 09/20/10, Dr. Prescott Gum)   . Chronic congestive heart failure with left ventricular diastolic dysfunction 9/73/5329  . Depression 11/19/2005  . Anxiety 07/24/2010  . Fibromyalgia 08/29/2010  . Obesity (BMI 30.0-34.9) 10/23/2011  . Adenomatous polyps 05/14/2011    Colonoscopy (05/2011): 4 mm adenomatous polyp excised endoscopically Colonoscopy (02/2002): Adenomatous polyp excised endoscopically   . Gastroesophageal reflux disease   . Internal hemorrhoids 08/04/2012  . Osteoporosis     DEXA (12/09/2011): L-spine T -3.7, left hip T -1.4 DEXA (12/2004): L-spine T -2.6, left hip -0.1   . Allergic rhinitis 06/01/2012  . Bilateral cataracts 08/04/2012    Visually insignificant   . Chronic constipation 02/03/2011  . Chronic venous insufficiency 08/04/2012  . Paroxysmal atrial fibrillation 10/22/2010    s/p Left atrial maze procedure for paroxysmal atrial fibrillation on 09/20/2010 by Dr Prescott Gum.  Subsequent splenic infarct, decision was made to re-anticoagulate with coumadin, likely life-long as this is the most likely cause of the splenic infarct.   . Chronic low back pain 10/06/2012  . Anemia of chronic disease 01/01/2013  . Obstructive sleep apnea     Nocturnal polysomnography (06/2009): Moderate sleep apnea/ hypopnea syndrome , AHI 17.8 per hour with nonpositional hypopneas. CPAP titration to 12  CWP, AHI 2.4 per hour. On nocturnal CPAP via a small resMed Quattro full-face mask with heated humidifier.   . Type 2 diabetes mellitus with diabetic neuropathy   . History of blood transfusion     "several times"   . Clear cell renal cell carcinoma 07/21/2011    s/p cryoablation of left RCC in 09/2011 by [name]. Followed by [name]  [facility]) .    Marland Kitchen Chronic daily headache 01/16/2014  .  Right nephrolithiasis 09/06/2014    5 mm non-obstructing calculus seen on CT scan 09/05/2014    Past Surgical History  Procedure Laterality Date  . Orif clavicle fracture  01/2004    by Thana Farr. [name], M.D for Right clavicle nonunion.  . Tonsillectomy    . Carotid endarterectomy Right 07/04/2010    by [name] for asymptomatic right carotid artery stenosis  . Lipoma excision  08/2005    occipital lipoma 1.5cm - by Dr. [name]  . Hysteroscopy with endometrial ablation  06/2001    for persistent post-menopausal bleeding // by S. [name], M.D.  . Mitral valve replacement  09/20/10     with a 27-mm pericardial porcine valve (Medtronic Mosaic valve, serial #[id number]). 09/20/10, Dr Prescott Gum  . Maze Left 09/20/10    for paroxysmal atrial fibrillation (Dr. Prescott Gum)  . Chest tube insertion  09/24/2010    Dr Prescott Gum  . Cardiac valve replacement  Aug. 2012    "mitral valve"  . Colonoscopy  05/12/2011    performed by Dr. Michail Sermon. Showing small internal hemorrhoids, single tubular adenoma polyp  . Esophagogastroduodenoscopy  05/12/2011    performed by Dr. Michail Sermon. Negative for ulcerations, biopsy negative for evidence of celiac sprue  . Tubal ligation    . Eye surgery    . Fracture surgery Right 2005    clavicle  . Dilation and curettage of uterus    . Cryoablation Left 09/2011    by [name]. Followed by [name]  [facility]) .     Family History  Problem Relation Age of Onset  . Peptic Ulcer Disease Father   . Heart attack Father 105    Died of MI at age 57  .  Heart attack Brother 28    Died of MI at age 14  . Obesity Brother   . Pneumonia Mother   . Healthy Sister   . Lupus Daughter   . Obsessive Compulsive Disorder Daughter    Social History   Social History  . Marital Status: Divorced    Spouse Name: N/A  . Number of Children: N/A  . Years of Education: N/A   Occupational History  . Not on file.   Social History Main Topics  . Smoking status: Current Every Day Smoker -- 1.50 packs/day for 49 years    Types: Cigarettes    Start date: 10/31/2013    Last Attempt to Quit: 10/31/2013  . Smokeless tobacco: Never Used     Comment: 1 1/2  PPD down to 1 ppd  . Alcohol Use: No  . Drug Use: No  . Sexual Activity: No   Other Topics Concern  . Not on file   Social History Narrative   Lives [place] in [place] with 60 pound chow   Worked at [employer] for 18 years   No car    Review of Systems: Pertinent items are noted in HPI.  Physical Exam: Blood pressure 140/96, pulse 80, temperature 98.4 F (36.9 C), temperature source Oral, resp. rate 20, height _0  (1.575 m), weight 192 lb 1.4 oz (87.13 kg), SpO2 94 %. Physical Exam  Constitutional: She is oriented to person, place, and time and well-developed, well-nourished, and in no distress.  Uncomfortable appearing, but in no acute distress.  HENT:  Head: Normocephalic and atraumatic.  Eyes: EOM are normal.  Neck: Normal range of motion. No tracheal deviation present.  Cardiovascular: Normal rate, regular rhythm, normal heart sounds and intact  distal pulses.   Pulmonary/Chest: No respiratory distress.  Decreased breath sounds 2/2 poor inspiratory effort. Scattered wheezes throughout all lung fields. No crackles.  Abdominal: Soft. She exhibits no distension. There is no tenderness. There is no rebound and no guarding.  Musculoskeletal:  TTP on right flank. No overlying bruising.   1+ edema to midshin bilaterally.  Neurological: She is alert and  oriented to person, place, and time.  Skin: Skin is warm and dry.     Lab results: Basic Metabolic Panel:  Recent Labs  10/13/14 1959  NA 140  K 4.2  CL 101  CO2 28  GLUCOSE 171*  BUN 17  CREATININE 1.03*  CALCIUM 9.2   Liver Function Tests: No results for input(s): AST, ALT, ALKPHOS, BILITOT, PROT, ALBUMIN in the last 72 hours. No results for input(s): LIPASE, AMYLASE in the last 72 hours. No results for input(s): AMMONIA in the last 72 hours. CBC:  Recent Labs  10/13/14 1959  WBC 9.7  NEUTROABS 7.7  HGB 10.3*  HCT 34.0*  MCV 91.6  PLT 247   Cardiac Enzymes: No results for input(s): CKTOTAL, CKMB, CKMBINDEX, TROPONINI in the last 72 hours. BNP: No results for input(s): PROBNP in the last 72 hours. D-Dimer: No results for input(s): DDIMER in the last 72 hours. CBG:  Recent Labs  10/11/14 1536  GLUCAP 191*   Hemoglobin A1C: No results for input(s): HGBA1C in the last 72 hours. Fasting Lipid Panel: No results for input(s): CHOL, HDL, LDLCALC, TRIG, CHOLHDL, LDLDIRECT in the last 72 hours. Thyroid Function Tests: No results for input(s): TSH, T4TOTAL, FREET4, T3FREE, THYROIDAB in the last 72 hours. Anemia Panel: No results for input(s): VITAMINB12, FOLATE, FERRITIN, TIBC, IRON, RETICCTPCT in the last 72 hours. Coagulation:  Recent Labs  10/13/14 1959  LABPROT 24.1*  INR 2.19*   Urine Drug Screen: Drugs of Abuse     Component Value Date/Time   LABOPIA POSITIVE* 01/07/2013 2114   COCAINSCRNUR NONE DETECTED 01/07/2013 2114   LABBENZ NONE DETECTED 01/07/2013 2114   AMPHETMU NONE DETECTED 01/07/2013 2114   THCU NONE DETECTED 01/07/2013 2114   LABBARB NONE DETECTED 01/07/2013 2114    Alcohol Level: No results for input(s): ETH in the last 72 hours. Urinalysis:  Recent Labs  10/13/14 Manuel Garcia 1.008  PHURINE 5.5  GLUCOSEU NEGATIVE  HGBUR NEGATIVE  BILIRUBINUR NEGATIVE  KETONESUR NEGATIVE  PROTEINUR NEGATIVE    UROBILINOGEN 0.2  NITRITE NEGATIVE  LEUKOCYTESUR NEGATIVE     Imaging results:  Dg Ribs Unilateral W/chest Right  10/13/2014   CLINICAL DATA:  Status post fall, with right flank pain. Initial encounter.  EXAM: RIGHT RIBS AND CHEST - 3+ VIEW  COMPARISON:  Chest and right rib radiographs performed 10/11/2014  FINDINGS: There is a minimally displaced fracture of the right eighth posterolateral rib.  The lungs are well-aerated. Mild vascular congestion is noted. There is no evidence of focal opacification, pleural effusion or pneumothorax.  The cardiomediastinal silhouette is borderline normal in size. The patient is status post median sternotomy. No acute osseous abnormalities are seen. A plate and screws are noted along the right clavicle.  IMPRESSION: 1. Minimally displaced fracture of the right eighth posterolateral rib. 2. Mild vascular congestion noted.  Lungs remain grossly clear.   Electronically Signed   By: Garald Balding M.D.   On: 10/13/2014 21:33   Ct Angio Chest Pe W/cm &/or Wo Cm  10/13/2014   CLINICAL DATA:  Chest pain and cough.  EXAM:  CT ANGIOGRAPHY CHEST WITH CONTRAST  TECHNIQUE: Multidetector CT imaging of the chest was performed using the standard protocol during bolus administration of intravenous contrast. Multiplanar CT image reconstructions and MIPs were obtained to evaluate the vascular anatomy.  CONTRAST:  122m OMNIPAQUE IOHEXOL 350 MG/ML SOLN  COMPARISON:  09/16/2010  FINDINGS: Cardiovascular: There is good opacification of the pulmonary arteries. There is no pulmonary embolism. The thoracic aorta is normal in caliber and intact.  Lungs: Centrilobular emphysematous changes are present, upper lobe predominant. Minimal interstitial coarsening in the lateral periphery of the inferior lower lobes, without confluent airspace consolidation.  Central airways: Patent  Effusions: None  Lymphadenopathy: Small nodes in the mediastinum and both hila, substantially decreased from 09/16/2010  and nonspecific.  Esophagus: Unremarkable  Upper abdomen: Unremarkable  Musculoskeletal: There are mildly displaced fractures of the right sixth and eighth ribs posteriorly and posterolaterally. No significant musculoskeletal lesion is evident.  Review of the MIP images confirms the above findings.  IMPRESSION: 1. Negative for pulmonary embolism 2. Emphysematous changes.  Mild peripheral interstitial coarsening. 3. Acute mildly displaced right sixth and right eighth rib fractures.   Electronically Signed   By: DAndreas NewportM.D.   On: 10/13/2014 23:19    Other results: EKG: .Marland Kitchen Assessment & Plan by Problem: Active Problems:   Rib fractures   Right rib fracture  Ms. EBoberis a 64yo female with CHF, paroxysmal AFib, moderate to severe pulmonary HTN, COPD, and osteoporosis, presenting with one week h/o right 6th and 8th rib fractures.  Acute Right 6th/8th Displaced Rib Fractures: CTA chest showed mildly displaced rib fractures. Likely secondary to osteoporosis. Remote history of fall 4 weeks ago, but patient only developed pain 1 week ago. Patient does have a prior history of fracture secondary to osteoporosis. Imaging shows no associated PTX, trauma, organ laceration, or evidence or bleeding from intercostal vessels. Admitted for uncontrolled pain after fentanyl 25 mcg x 2. No need for referral or surgery at this time. Pain control with dilaudid. -Dilaudid 0.5 mg IV Q4H prn -Norco 5-325 1-2 tabs Q6H prn -Incentive Spirometry -Lidocaine cream  Paroxysmal Atrial Fibrillation: On Coumadin 2.5 mg QD, but 5 mg on Monday. INR 2.19 on admission. No evidence of bleeding. -Coumadin 2.5 mg QD, except Monday 5 mg   Emphysema/Tobacco Abuse: Patient is on Albuterol, Advair and Spriva at home.  -Supplemental O2 as needed -Albuterol prn -Duoneb Q4H prn -Nicotine Patch  T2DM: patient is on Levemir 45 units BID and Novolog 18 units with meals. -SSI-M -Levemir 30 units BID -Carb mod  diet -Gabapentin 300 mg TID  Chronic dCHF: Not clinically in CHF exacerbation. Patient is on Lasix 80 mg BID and Kdur 20 mEq BID at home. -Continue Lasix and Kdur  Depression: Patient is on Xanax 0.5-1 mg BID prn, wellbutrin 100 mg BID and zoloft 200 mg daily. -Continue Wellbutrin and Zoloft -Holding Xanax  GERD: On omeprazole 40 mg daily at home. -Protonix 80 mg daily HLD: On rosuvastatin 20 mg daily at home. -Continue rosuvastatin  DVT/PE ppx: Coumadin  FEN/GI: -Carb mod  Dispo: Disposition is deferred at this time, awaiting improvement of current medical problems.   The patient does have a current PCP ([name] MD) and [facility].  The patient does not have transportation limitations that hinder transportation to clinic appointments.  Signed: NIline Oven MD, PhD 10/14/2014, 6:38 AM

## 2014-10-14 NOTE — Progress Notes (Signed)
Patient arrived from [facility] by Care link at this time

## 2014-10-15 DIAGNOSIS — E785 Hyperlipidemia, unspecified: Secondary | ICD-10-CM

## 2014-10-15 LAB — PROTIME-INR
INR: 1.82 — ABNORMAL HIGH (ref 0.00–1.49)
Prothrombin Time: 21 seconds — ABNORMAL HIGH (ref 11.6–15.2)

## 2014-10-15 LAB — GLUCOSE, CAPILLARY
Glucose-Capillary: 140 mg/dL — ABNORMAL HIGH (ref 65–99)
Glucose-Capillary: 172 mg/dL — ABNORMAL HIGH (ref 65–99)
Glucose-Capillary: 166 mg/dL — ABNORMAL HIGH (ref 65–99)
Glucose-Capillary: 267 mg/dL — ABNORMAL HIGH (ref 65–99)

## 2014-10-15 LAB — CBC
HCT: 30.7 % — ABNORMAL LOW (ref 36.0–46.0)
Hemoglobin: 9.2 g/dL — ABNORMAL LOW (ref 12.0–15.0)
MCH: 27.6 pg (ref 26.0–34.0)
MCHC: 30 g/dL (ref 30.0–36.0)
MCV: 92.2 fL (ref 78.0–100.0)
Platelets: 233 10*3/uL (ref 150–400)
RBC: 3.33 MIL/uL — ABNORMAL LOW (ref 3.87–5.11)
RDW: 17 % — ABNORMAL HIGH (ref 11.5–15.5)
WBC: 6.6 10*3/uL (ref 4.0–10.5)

## 2014-10-15 MED ORDER — ALPRAZOLAM 0.5 MG PO TABS
0.5000 mg | ORAL_TABLET | Freq: Two times a day (BID) | ORAL | Status: DC | PRN
  Administered 2014-10-15 – 2014-10-16 (×2): 1 mg via ORAL
  Filled 2014-10-15 (×3): qty 2

## 2014-10-15 MED ORDER — WARFARIN SODIUM 5 MG PO TABS
5.0000 mg | ORAL_TABLET | Freq: Once | ORAL | Status: AC
  Administered 2014-10-15: 5 mg via ORAL
  Filled 2014-10-15: qty 1

## 2014-10-15 MED ORDER — HYDROMORPHONE HCL 2 MG PO TABS
1.0000 mg | ORAL_TABLET | ORAL | Status: DC | PRN
  Administered 2014-10-15 – 2014-10-16 (×5): 1 mg via ORAL
  Filled 2014-10-15 (×6): qty 1

## 2014-10-15 MED ORDER — MOMETASONE FURO-FORMOTEROL FUM 100-5 MCG/ACT IN AERO
2.0000 | INHALATION_SPRAY | Freq: Two times a day (BID) | RESPIRATORY_TRACT | Status: DC
  Filled 2014-10-15 (×2): qty 8.8

## 2014-10-15 MED ORDER — CYCLOBENZAPRINE HCL 10 MG PO TABS
5.0000 mg | ORAL_TABLET | Freq: Three times a day (TID) | ORAL | Status: DC
  Administered 2014-10-15 – 2014-10-16 (×4): 5 mg via ORAL
  Filled 2014-10-15 (×4): qty 1

## 2014-10-15 MED ORDER — TIOTROPIUM BROMIDE MONOHYDRATE 18 MCG IN CAPS
18.0000 ug | ORAL_CAPSULE | Freq: Every day | RESPIRATORY_TRACT | Status: DC
  Filled 2014-10-15: qty 5

## 2014-10-15 MED ORDER — WARFARIN - PHARMACIST DOSING INPATIENT: Freq: Every day | Status: DC

## 2014-10-15 MED ORDER — TIOTROPIUM BROMIDE MONOHYDRATE 18 MCG IN CAPS
18.0000 ug | ORAL_CAPSULE | Freq: Every day | RESPIRATORY_TRACT | Status: DC
  Filled 2014-10-15 (×2): qty 5

## 2014-10-15 MED ORDER — MOMETASONE FURO-FORMOTEROL FUM 100-5 MCG/ACT IN AERO
2.0000 | INHALATION_SPRAY | Freq: Two times a day (BID) | RESPIRATORY_TRACT | Status: DC
  Filled 2014-10-15: qty 8.8

## 2014-10-15 NOTE — Discharge Instructions (Addendum)
Please make sure to follow-up in the Internal Medicine Clinic in the next week.  They will be calling you to arrange an appointment time.  We will send you home with a prescription for Dilaudid and Flexeril.  While taking Dilaudid, please do not take your home Vicodin at the same time.  If your pain improves, you can stop taking the Dilaudid and restart your Vicodin.     Information on my medicine - Coumadin   (Warfarin)  This medication education was reviewed with me or my healthcare representative as part of my discharge preparation.  The pharmacist that spoke with me during my hospital stay was:  Romona Curls, [facility]  Why was Coumadin prescribed for you? Coumadin was prescribed for you because you have a blood clot or a medical condition that can cause an increased risk of forming blood clots. Blood clots can cause serious health problems by blocking the flow of blood to the heart, lung, or brain. Coumadin can prevent harmful blood clots from forming. As a reminder your indication for Coumadin is:   Select from menu  What test will check on my response to Coumadin? While on Coumadin (warfarin) you will need to have an INR test regularly to ensure that your dose is keeping you in the desired range. The INR (international normalized ratio) number is calculated from the result of the laboratory test called prothrombin time (PT).  If an INR APPOINTMENT HAS NOT ALREADY BEEN MADE FOR YOU please schedule an appointment to have this lab work done by your health care provider within 7 days. Your INR goal is usually a number between:  2 to 3 or your provider may give you a more narrow range like 2-2.5.  Ask your health care provider during an office visit what your goal INR is.  What  do you need to  know  About  COUMADIN? Take Coumadin (warfarin) exactly as prescribed by your healthcare provider about the same time each day.  DO NOT stop taking without talking to the doctor who prescribed the  medication.  Stopping without other blood clot prevention medication to take the place of Coumadin may increase your risk of developing a new clot or stroke.  Get refills before you run out.  What do you do if you miss a dose? If you miss a dose, take it as soon as you remember on the same day then continue your regularly scheduled regimen the next day.  Do not take two doses of Coumadin at the same time.  Important Safety Information A possible side effect of Coumadin (Warfarin) is an increased risk of bleeding. You should call your healthcare provider right away if you experience any of the following: ? Bleeding from an injury or your nose that does not stop. ? Unusual colored urine (red or dark brown) or unusual colored stools (red or black). ? Unusual bruising for unknown reasons. ? A serious fall or if you hit your head (even if there is no bleeding).  Some foods or medicines interact with Coumadin (warfarin) and might alter your response to warfarin. To help avoid this: ? Eat a balanced diet, maintaining a consistent amount of Vitamin K. ? Notify your provider about major diet changes you plan to make. ? Avoid alcohol or limit your intake to 1 drink for women and 2 drinks for men per day. (1 drink is 5 oz. wine, 12 oz. beer, or 1.5 oz. liquor.)  Make sure that ANY health care provider who  prescribes medication for you knows that you are taking Coumadin (warfarin).  Also make sure the healthcare provider who is monitoring your Coumadin knows when you have started a new medication including herbals and non-prescription products.  Coumadin (Warfarin)  Major Drug Interactions  Increased Warfarin Effect Decreased Warfarin Effect  Alcohol (large quantities) Antibiotics (esp. Septra/Bactrim, Flagyl, Cipro) Amiodarone (Cordarone) Aspirin (ASA) Cimetidine (Tagamet) Megestrol (Megace) NSAIDs (ibuprofen, naproxen, etc.) Piroxicam (Feldene) Propafenone (Rythmol SR) Propranolol  (Inderal) Isoniazid (INH) Posaconazole (Noxafil) Barbiturates (Phenobarbital) Carbamazepine (Tegretol) Chlordiazepoxide (Librium) Cholestyramine (Questran) Griseofulvin Oral Contraceptives Rifampin Sucralfate (Carafate) Vitamin K   Coumadin (Warfarin) Major Herbal Interactions  Increased Warfarin Effect Decreased Warfarin Effect  Garlic Ginseng Ginkgo biloba Coenzyme Q10 Green tea St. Johns wort    Coumadin (Warfarin) FOOD Interactions  Eat a consistent number of servings per week of foods HIGH in Vitamin K (1 serving =  cup)  Collards (cooked, or boiled & drained) Kale (cooked, or boiled & drained) Mustard greens (cooked, or boiled & drained) Parsley *serving size only =  cup Spinach (cooked, or boiled & drained) Swiss chard (cooked, or boiled & drained) Turnip greens (cooked, or boiled & drained)  Eat a consistent number of servings per week of foods MEDIUM-HIGH in Vitamin K (1 serving = 1 cup)  Asparagus (cooked, or boiled & drained) Broccoli (cooked, boiled & drained, or raw & chopped) Brussel sprouts (cooked, or boiled & drained) *serving size only =  cup Lettuce, raw (green leaf, endive, romaine) Spinach, raw Turnip greens, raw & chopped   These websites have more information on Coumadin (warfarin):  FailFactory.se; VeganReport.com.au;

## 2014-10-15 NOTE — Progress Notes (Signed)
  Date: 10/15/2014  Patient name: [name]  Medical record number: [id number]  Date of birth: 1951-01-21   This patient's plan of care was discussed with the house staff. Please see Dr. Alcario Drought note for complete details. I concur with his findings.  Attempt to transition to oral medications only today.  Scheduled flexeril.  Anticipate discharge in next 1-2 days.    Sid Falcon, MD 10/15/2014, 8:16 PM

## 2014-10-15 NOTE — Procedures (Signed)
Pt has home CPAP machine.  RT inspected. Pt will place self when ready.

## 2014-10-15 NOTE — Progress Notes (Signed)
Subjective: 64 y.o. female admitted on 10/14/2014 for rib fracture with x-ray showing single 8th rib fracture on the right.  Subsequent CT scan last night in the ED showed 6th and 8th rib fractures on the right.  This morning patient reported no acute events overnight.  Still having some considerable right sided rib pain that is slightly improved per the patient.  She also complains of difficulty sleeping at night, asking for her home Xanax to be restarted.  Denies any shortness of breath, chest pain.  Reports having bowel movements since admission.    Objective: Vital signs in last 24 hours: Filed Vitals:   10/14/14 2021 10/14/14 2034 10/15/14 0552 10/15/14 0840  BP:  128/41 131/41   Pulse: 64 67 98   Temp:  98.8 F (37.1 C) 98.4 F (36.9 C)   TempSrc:  Oral Oral   Resp: _0 Height:      Weight:      SpO2:  94% 94% 94%   Weight change:   Intake/Output Summary (Last 24 hours) at 10/15/14 1147 Last data filed at 10/14/14 1300  Gross per 24 hour  Intake    240 ml  Output      0 ml  Net    240 ml   General: lying in bed, no distress, receiving breathing treatment HEENT: EOMI, no scleral icterus Cardiac: RRR, no rubs, murmurs or gallops.  Still with some tenderness over her ribcage area radiating to the front. Pulm: clear to auscultation bilaterally, diminished movement of air Abd: soft, nontender, nondistended, BS present Ext: warm and well perfused, no pedal edema Neuro: alert and oriented X3, cranial nerves II-XII grossly intact  Lab Results: Basic Metabolic Panel:  Recent Labs Lab 10/13/14 1959 10/14/14 0734  NA 140 135  K 4.2 3.8  CL 101 98*  CO2 28 28  GLUCOSE 171* 155*  BUN 17 12  CREATININE 1.03* 1.01*  CALCIUM 9.2 9.1   CBC:  Recent Labs Lab 10/13/14 1959 10/14/14 0734 10/15/14 0743  WBC 9.7 9.7 6.6  NEUTROABS 7.7  --   --   HGB 10.3* 9.7* 9.2*  HCT 34.0* 31.8* 30.7*  MCV 91.6 91.1 92.2  PLT 247 277 233   CBG:  Recent Labs Lab  10/14/14 0759 10/14/14 1223 10/14/14 1611 10/14/14 2123 10/15/14 0646 10/15/14 1139  GLUCAP 199* 163* 243* 197* 140* 172*   Coagulation:  Recent Labs Lab 10/13/14 1959 10/15/14 0743  LABPROT 24.1* 21.0*  INR 2.19* 1.82*   Urinalysis:  Recent Labs Lab 10/13/14 1958  COLORURINE STRAW*  LABSPEC 1.008  PHURINE 5.5  GLUCOSEU NEGATIVE  HGBUR NEGATIVE  BILIRUBINUR NEGATIVE  KETONESUR NEGATIVE  PROTEINUR NEGATIVE  UROBILINOGEN 0.2  NITRITE NEGATIVE  LEUKOCYTESUR NEGATIVE   Studies/Results: Dg Ribs Unilateral W/chest Right  10/13/2014   CLINICAL DATA:  Status post fall, with right flank pain. Initial encounter.  EXAM: RIGHT RIBS AND CHEST - 3+ VIEW  COMPARISON:  Chest and right rib radiographs performed 10/11/2014  FINDINGS: There is a minimally displaced fracture of the right eighth posterolateral rib.  The lungs are well-aerated. Mild vascular congestion is noted. There is no evidence of focal opacification, pleural effusion or pneumothorax.  The cardiomediastinal silhouette is borderline normal in size. The patient is status post median sternotomy. No acute osseous abnormalities are seen. A plate and screws are noted along the right clavicle.  IMPRESSION: 1. Minimally displaced fracture of the right eighth posterolateral rib. 2. Mild vascular congestion noted.  Lungs remain grossly clear.   Electronically Signed   By: Garald Balding M.D.   On: 10/13/2014 21:33   Ct Angio Chest Pe W/cm &/or Wo Cm  10/13/2014   CLINICAL DATA:  Chest pain and cough.  EXAM: CT ANGIOGRAPHY CHEST WITH CONTRAST  TECHNIQUE: Multidetector CT imaging of the chest was performed using the standard protocol during bolus administration of intravenous contrast. Multiplanar CT image reconstructions and MIPs were obtained to evaluate the vascular anatomy.  CONTRAST:  148m OMNIPAQUE IOHEXOL 350 MG/ML SOLN  COMPARISON:  09/16/2010  FINDINGS: Cardiovascular: There is good opacification of the pulmonary arteries. There  is no pulmonary embolism. The thoracic aorta is normal in caliber and intact.  Lungs: Centrilobular emphysematous changes are present, upper lobe predominant. Minimal interstitial coarsening in the lateral periphery of the inferior lower lobes, without confluent airspace consolidation.  Central airways: Patent  Effusions: None  Lymphadenopathy: Small nodes in the mediastinum and both hila, substantially decreased from 09/16/2010 and nonspecific.  Esophagus: Unremarkable  Upper abdomen: Unremarkable  Musculoskeletal: There are mildly displaced fractures of the right sixth and eighth ribs posteriorly and posterolaterally. No significant musculoskeletal lesion is evident.  Review of the MIP images confirms the above findings.  IMPRESSION: 1. Negative for pulmonary embolism 2. Emphysematous changes.  Mild peripheral interstitial coarsening. 3. Acute mildly displaced right sixth and right eighth rib fractures.   Electronically Signed   By: DAndreas NewportM.D.   On: 10/13/2014 23:19   Medications:  Scheduled Meds: . buPROPion  100 mg Oral BID WC  . cyclobenzaprine  5 mg Oral TID  . furosemide  80 mg Oral BID  . gabapentin  300 mg Oral TID  . insulin aspart  0-15 Units Subcutaneous TID WC  . insulin detemir  30 Units Subcutaneous BID  . lidocaine  1 patch Transdermal Q24H  . mometasone-formoterol  2 puff Inhalation BID  . nicotine  14 mg Transdermal Daily  . pantoprazole  80 mg Oral Daily  . potassium chloride SA  20 mEq Oral BID  . rosuvastatin  20 mg Oral QHS  . sertraline  200 mg Oral Daily  . [START ON 10/16/2014] tiotropium  18 mcg Inhalation Daily  . warfarin  2.5 mg Oral Once per day on Sun Tue Wed Thu Fri Sat   And  . [START ON 10/16/2014] warfarin  5 mg Oral Q Mon-1800  . Warfarin - Physician Dosing Inpatient   Does not apply q1800   Continuous Infusions:  PRN Meds:.albuterol, ALPRAZolam, HYDROmorphone, ipratropium-albuterol Assessment/Plan: Active Problems:   Type 2 diabetes mellitus  with diabetic neuropathy   Pulmonary emphysema   Moderate to severe pulmonary hypertension   Paroxysmal atrial fibrillation   Right rib fracture   Fracture of ribs, two, closed   Intractable pain  Acute Right 6th/8th Displaced Rib Fractures: CTA chest showed mildly displaced rib fractures. Likely secondary to osteoporosis. Remote history of fall 4 weeks ago, but patient only developed pain 1 week ago. Patient does have a prior history of fracture secondary to osteoporosis.  -discontinue IV dilaudid -Dilaudid 19mPO q4h prn -discontinue Norco 5-325 1-2 tablets q6h prn -Flexeril 27m37mO tid scheduled -Lidoderm patch q12h -incentive spirometry  Paroxysmal Atrial Fibrillation: On Coumadin 2.5 mg daily, but 5 mg on Monday. INR 2.19 on admission. No evidence of bleeding. -Coumadin 2.5 mg daily, except Monday 5 mg   Emphysema/Tobacco Abuse: Patient is on Albuterol, Advair and Spriva at home.  -Supplemental O2 as needed -Albuterol prn -Duoneb q4h prn -  Nicotine Patch  Diabetes mellitus type II: patient is on Levemir 45 units BID and Novolog 18 units with meals. -SSI-M -Levemir 30 units BID -Carb mod diet -Gabapentin 300 mg TID  Pulmonary Hypertension -CPAP at night  Chronic dCHF: Not clinically in CHF exacerbation. Patient is on Lasix 80 mg BID and Kdur 20 mEq BID at home. -Continue Lasix and Kdur  Depression: Patient is on Xanax 0.5-1 mg BID prn, wellbutrin 100 mg BID and zoloft 200 mg daily. -Continue Wellbutrin and Zoloft -restart Xanax 0.5-33m PO bid prn  GERD: On omeprazole 40 mg bid at home. -Protonix 80 mg daily  Hyperlipidemia: On rosuvastatin 20 mg daily at home. -Continue rosuvastatin  DVT/PE ppx: Coumadin  Diet: Carb mod  Code: Full  Dispo: Disposition is deferred at this time, awaiting improvement of current medical problems.    The patient does have a current PCP ([name] MD) and [facility]  The  patient does not have transportation limitations that hinder transportation to clinic appointments.   LOS: 1 day   AJule Ser DO 10/15/2014, 11:47 AM

## 2014-10-15 NOTE — Progress Notes (Signed)
ANTICOAGULATION CONSULT NOTE - Initial Consult  Pharmacy Consult for warfarin Indication: atrial fibrillation  Allergies  Allergen Reactions  . Lorazepam     Patient's sister noted that ativan caused the patient to become extremely confused during hospitalization 09/2010  . Morphine And Related Other (See Comments)  . Oxycontin [Oxycodone] Other (See Comments)    headache  . Tramadol Hcl     REACTION: swelling    Patient Measurements: Height: _0  (157.5 cm) Weight: 192 lb 1.4 oz (87.13 kg) IBW/kg (Calculated) : 50.1  Vital Signs: Temp: 98.4 F (36.9 C) (09/04 0552) Temp Source: Oral (09/04 0552) BP: 131/41 mmHg (09/04 0552) Pulse Rate: 98 (09/04 0552)  Labs:  Recent Labs  10/13/14 1959 10/14/14 0734 10/15/14 0743  HGB 10.3* 9.7* 9.2*  HCT 34.0* 31.8* 30.7*  PLT 247 277 233  LABPROT 24.1*  --  21.0*  INR 2.19*  --  1.82*  CREATININE 1.03* 1.01*  --     Estimated Creatinine Clearance: 56.9 mL/min (by C-G formula based on Cr of 1.01).   Medical History: Past Medical History  Diagnosis Date  . COPD (chronic obstructive pulmonary disease) with emphysema     PFTs 02/2012: FEV1 0.92 (40%), ratio 69, 27% increase in FEV1 with BD, TLC 91%, severe airtrapping, DLCO49% On chronic home O2. Pulmonary rehab referral 05/2012   . Moderate to severe pulmonary hypertension     2014 TEE w PA peak pressure 46 mmHg, s/p MV replacement   . Tobacco abuse 07/28/2012  . Hyperlipidemia LDL goal < 100 11/20/2005  . Carotid artery stenosis     s/p right endarterectomy (06/2010) Carotid US (07/2010):  Left: Moderate-to-severe (60-79%) calcific and non-calcific plaque origin and proximal ICA and ECA   . Mitral stenosis     s/p Mitral valve replacement with a 27-mm pericardial porcine valve (Medtronic Mosaic valve, serial #[id number] on 09/20/10, Dr. Prescott Gum)   . Chronic congestive heart failure with left ventricular diastolic dysfunction 8/54/6270  . Depression 11/19/2005  . Anxiety  07/24/2010  . Fibromyalgia 08/29/2010  . Obesity (BMI 30.0-34.9) 10/23/2011  . Adenomatous polyps 05/14/2011    Colonoscopy (05/2011): 4 mm adenomatous polyp excised endoscopically Colonoscopy (02/2002): Adenomatous polyp excised endoscopically   . Gastroesophageal reflux disease   . Internal hemorrhoids 08/04/2012  . Osteoporosis     DEXA (12/09/2011): L-spine T -3.7, left hip T -1.4 DEXA (12/2004): L-spine T -2.6, left hip -0.1   . Allergic rhinitis 06/01/2012  . Bilateral cataracts 08/04/2012    Visually insignificant   . Chronic constipation 02/03/2011  . Chronic venous insufficiency 08/04/2012  . Paroxysmal atrial fibrillation 10/22/2010    s/p Left atrial maze procedure for paroxysmal atrial fibrillation on 09/20/2010 by Dr Prescott Gum.  Subsequent splenic infarct, decision was made to re-anticoagulate with coumadin, likely life-long as this is the most likely cause of the splenic infarct.   . Chronic low back pain 10/06/2012  . Anemia of chronic disease 01/01/2013  . Obstructive sleep apnea     Nocturnal polysomnography (06/2009): Moderate sleep apnea/ hypopnea syndrome , AHI 17.8 per hour with nonpositional hypopneas. CPAP titration to 12 CWP, AHI 2.4 per hour. On nocturnal CPAP via a small resMed Quattro full-face mask with heated humidifier.   . Type 2 diabetes mellitus with diabetic neuropathy   . History of blood transfusion     "several times"   . Clear cell renal cell carcinoma 07/21/2011    s/p cryoablation of left RCC in 09/2011 by [name]. Followed by  [name]  [facility]) .    Marland Kitchen Chronic daily headache 01/16/2014  . Right nephrolithiasis 09/06/2014    5 mm non-obstructing calculus seen on CT scan 09/05/2014     Assessment: 39 yof admitted for rib fractures. Initially MD resumed home dose but now warfarin per pharmacy for hx afib (pta).  INR therapeutic 2.19 on admit>>down to 1.82 today s/p 2.43m dose (patient missed dose on 9/2). No bleed documented Hg stable 9.2, plt  wnl  PTA warfarin dose: 2.532mdaily, except 36m736mn Mon (last dose on 9/1 pta)   Goal of Therapy:  INR 2-3 Monitor platelets by anticoagulation protocol: Yes   Plan:  D/c home dose for now - may resume soon Warfarin 36mg49m1 dose tonight Daily INR, mon s/sx bleeding  HaleElicia LamparmD Clinical Pharmacist Pager [phone] 2:03 PM

## 2014-10-16 LAB — GLUCOSE, CAPILLARY
Glucose-Capillary: 188 mg/dL — ABNORMAL HIGH (ref 65–99)
Glucose-Capillary: 164 mg/dL — ABNORMAL HIGH (ref 65–99)

## 2014-10-16 LAB — PROTIME-INR
INR: 1.71 — ABNORMAL HIGH (ref 0.00–1.49)
Prothrombin Time: 20.1 seconds — ABNORMAL HIGH (ref 11.6–15.2)

## 2014-10-16 MED ORDER — HYDROMORPHONE HCL 2 MG PO TABS: 1.0000 mg | ORAL_TABLET | ORAL | Status: DC | PRN

## 2014-10-16 MED ORDER — WARFARIN SODIUM 5 MG PO TABS: 5.0000 mg | ORAL_TABLET | Freq: Once | ORAL | Status: DC

## 2014-10-16 MED ORDER — NYSTATIN 100000 UNIT/GM EX POWD
Freq: Two times a day (BID) | CUTANEOUS | Status: DC
  Administered 2014-10-16: 12:00:00 via TOPICAL
  Filled 2014-10-16: qty 15

## 2014-10-16 MED ORDER — CYCLOBENZAPRINE HCL 5 MG PO TABS: 5.0000 mg | ORAL_TABLET | Freq: Three times a day (TID) | ORAL | Status: DC

## 2014-10-16 NOTE — Progress Notes (Signed)
  Date: 10/16/2014  Patient name: [name]  Medical record number: [id number]  Date of birth: November 02, 1950   This patient's plan of care was discussed with the house staff. Please see Dr. Alcario Drought note for complete details. I concur with his findings.  Ms. Sedlack pain is being treated with oral medications.  She is stable for discharged today.   Sid Falcon, MD 10/16/2014, 9:37 PM

## 2014-10-16 NOTE — Progress Notes (Signed)
Subjective: 64 y.o. female admitted on 10/14/2014 for rib fracture with x-ray showing single 8th rib fracture on the right.  Subsequent CT scan last night in the ED showed 6th and 8th rib fractures on the right.    This morning, patient reports she is still having right sided ribcage pain worse with deep inspiration and cough.  States she is still having muscle spasm that exacerbates the pain.  Still having difficulty sleeping. No chest pain, shortness of breath, abdominal pain.  No urinary complaints.  Last bowel movement was yesterday.  Objective: Vital signs in last 24 hours: Filed Vitals:   10/15/14 1428 10/15/14 2147 10/15/14 2153 10/16/14 0540  BP: 132/68  148/48 132/34  Pulse: 78  74 76  Temp: 98.5 F (36.9 C)  97.8 F (36.6 C) 98.2 F (36.8 C)  TempSrc:   Oral Oral  Resp: _0 Height:      Weight:      SpO2: 95% 98% 100% 95%   Weight change:   Intake/Output Summary (Last 24 hours) at 10/16/14 0948 Last data filed at 10/15/14 1837  Gross per 24 hour  Intake    480 ml  Output      0 ml  Net    480 ml   General: sitting up in chair, reading off her tablet, no distress HEENT: EOMI, no scleral icterus Cardiac: RRR, no rubs, murmurs or gallops.  Tender to palpation over right ribcage area, seems to be a little better than yesterday Pulm: clear to auscultation bilaterally, diminished movement of air Abd: soft, nontender, nondistended Ext: warm and well perfused, no pedal edema Neuro: alert and oriented X3, cranial nerves II-XII grossly intact  Lab Results: Basic Metabolic Panel:  Recent Labs Lab 10/13/14 1959 10/14/14 0734  NA 140 135  K 4.2 3.8  CL 101 98*  CO2 28 28  GLUCOSE 171* 155*  BUN 17 12  CREATININE 1.03* 1.01*  CALCIUM 9.2 9.1   CBC:  Recent Labs Lab 10/13/14 1959 10/14/14 0734 10/15/14 0743  WBC 9.7 9.7 6.6  NEUTROABS 7.7  --   --   HGB 10.3* 9.7* 9.2*  HCT 34.0* 31.8* 30.7*  MCV 91.6 91.1 92.2  PLT 247 277 233    CBG:  Recent Labs Lab 10/14/14 2123 10/15/14 0646 10/15/14 1139 10/15/14 1649 10/15/14 2149 10/16/14 0647  GLUCAP 197* 140* 172* 166* 267* 188*   Coagulation:  Recent Labs Lab 10/13/14 1959 10/15/14 0743 10/16/14 0450  LABPROT 24.1* 21.0* 20.1*  INR 2.19* 1.82* 1.71*   Urinalysis:  Recent Labs Lab 10/13/14 1958  COLORURINE STRAW*  LABSPEC 1.008  PHURINE 5.5  GLUCOSEU NEGATIVE  HGBUR NEGATIVE  BILIRUBINUR NEGATIVE  KETONESUR NEGATIVE  PROTEINUR NEGATIVE  UROBILINOGEN 0.2  NITRITE NEGATIVE  LEUKOCYTESUR NEGATIVE   Studies/Results: No results found. Medications:  Scheduled Meds: . buPROPion  100 mg Oral BID WC  . cyclobenzaprine  5 mg Oral TID  . furosemide  80 mg Oral BID  . gabapentin  300 mg Oral TID  . insulin aspart  0-15 Units Subcutaneous TID WC  . insulin detemir  30 Units Subcutaneous BID  . lidocaine  1 patch Transdermal Q24H  . mometasone-formoterol  2 puff Inhalation BID  . nicotine  14 mg Transdermal Daily  . nystatin   Topical BID  . pantoprazole  80 mg Oral Daily  . potassium chloride SA  20 mEq Oral BID  . rosuvastatin  20 mg Oral QHS  . sertraline  200 mg Oral Daily  . tiotropium  18 mcg Inhalation Daily  . warfarin  5 mg Oral ONCE-1800  . Warfarin - Pharmacist Dosing Inpatient   Does not apply q1800   Continuous Infusions:  PRN Meds:.albuterol, ALPRAZolam, HYDROmorphone, ipratropium-albuterol Assessment/Plan: Active Problems:   Type 2 diabetes mellitus with diabetic neuropathy   Pulmonary emphysema   Moderate to severe pulmonary hypertension   Paroxysmal atrial fibrillation   Right rib fracture   Fracture of ribs, two, closed   Intractable pain  Acute Right 6th/8th Displaced Rib Fractures: CTA chest showed mildly displaced rib fractures. Likely secondary to osteoporosis. Remote history of fall 4 weeks ago, but patient only developed pain 1 week ago. Patient does have a prior history of fracture secondary to osteoporosis.  IV  dilaudid was stopped yesterday and she has been receiving PO dilaudid q4h prn with some mild relief.  Also receiving scheduled Flexeril 43m PO tid and Lidoderm patch q24h. -Dilaudid 129mPO q4h prn -discontinue Norco 5-325 1-2 tablets q6h prn. -Flexeril 3m70mO tid scheduled -Lidoderm patch q12h -incentive spirometry -plan for discharge today on oral pain medcations  Paroxysmal Atrial Fibrillation: On Coumadin 2.5 mg daily, but 5 mg on Monday. INR 2.19 on admission. No evidence of bleeding. -Coumadin 2.5 mg daily, except Monday 5 mg   Emphysema/Tobacco Abuse: Patient is on Albuterol, Advair and Spriva at home.  -Supplemental O2 as needed -Albuterol prn -Duoneb q4h prn -Dulera bid -Spiriva daily -Nicotine Patch  Diabetes mellitus type II: patient is on Levemir 45 units BID and Novolog 18 units with meals. -SSI-M -Levemir 30 units BID -Carb mod diet -Gabapentin 300 mg TID  Pulmonary Hypertension -CPAP at night  Chronic dCHF: Not clinically in CHF exacerbation. Patient is on Lasix 80 mg BID and Kdur 20 mEq BID at home. -Continue Lasix and Kdur  Depression: Patient is on Xanax 0.5-1 mg BID prn, wellbutrin 100 mg BID and zoloft 200 mg daily. -Continue Wellbutrin and Zoloft -restart Xanax 0.5-1mg48m bid prn  GERD: On omeprazole 40 mg bid at home. -Protonix 80 mg daily  Hyperlipidemia: On rosuvastatin 20 mg daily at home. -Continue rosuvastatin  DVT/PE ppx: Coumadin  Diet: Carb mod  Code: Full  Dispo: Plan for discharge home today with outpatient follow-up.  The patient does have a current PCP (LawOval Linsey) and [facility]  The patient does not have transportation limitations that hinder transportation to clinic appointments.   LOS: 2 days   AndrJule Ser 10/16/2014, 9:48 AM

## 2014-10-16 NOTE — Progress Notes (Signed)
ANTICOAGULATION CONSULT NOTE - Initial Consult  Pharmacy Consult for warfarin Indication: atrial fibrillation  Allergies  Allergen Reactions  . Lorazepam     Patient's sister noted that ativan caused the patient to become extremely confused during hospitalization 09/2010  . Morphine And Related Other (See Comments)  . Oxycontin [Oxycodone] Other (See Comments)    headache  . Tramadol Hcl     REACTION: swelling    Patient Measurements: Height: _0  (157.5 cm) Weight: 192 lb 1.4 oz (87.13 kg) IBW/kg (Calculated) : 50.1  Vital Signs: Temp: 98.2 F (36.8 C) (09/05 0540) Temp Source: Oral (09/05 0540) BP: 132/34 mmHg (09/05 0540) Pulse Rate: 76 (09/05 0540)  Labs:  Recent Labs  10/13/14 1959 10/14/14 0734 10/15/14 0743 10/16/14 0450  HGB 10.3* 9.7* 9.2*  --   HCT 34.0* 31.8* 30.7*  --   PLT 247 277 233  --   LABPROT 24.1*  --  21.0* 20.1*  INR 2.19*  --  1.82* 1.71*  CREATININE 1.03* 1.01*  --   --     Estimated Creatinine Clearance: 56.9 mL/min (by C-G formula based on Cr of 1.01).   Medical History: Past Medical History  Diagnosis Date  . COPD (chronic obstructive pulmonary disease) with emphysema     PFTs 02/2012: FEV1 0.92 (40%), ratio 69, 27% increase in FEV1 with BD, TLC 91%, severe airtrapping, DLCO49% On chronic home O2. Pulmonary rehab referral 05/2012   . Moderate to severe pulmonary hypertension     2014 TEE w PA peak pressure 46 mmHg, s/p MV replacement   . Tobacco abuse 07/28/2012  . Hyperlipidemia LDL goal < 100 11/20/2005  . Carotid artery stenosis     s/p right endarterectomy (06/2010) Carotid US (07/2010):  Left: Moderate-to-severe (60-79%) calcific and non-calcific plaque origin and proximal ICA and ECA   . Mitral stenosis     s/p Mitral valve replacement with a 27-mm pericardial porcine valve (Medtronic Mosaic valve, serial #[id number] on 09/20/10, Dr. Prescott Gum)   . Chronic congestive heart failure with left ventricular diastolic dysfunction  7/70/3403  . Depression 11/19/2005  . Anxiety 07/24/2010  . Fibromyalgia 08/29/2010  . Obesity (BMI 30.0-34.9) 10/23/2011  . Adenomatous polyps 05/14/2011    Colonoscopy (05/2011): 4 mm adenomatous polyp excised endoscopically Colonoscopy (02/2002): Adenomatous polyp excised endoscopically   . Gastroesophageal reflux disease   . Internal hemorrhoids 08/04/2012  . Osteoporosis     DEXA (12/09/2011): L-spine T -3.7, left hip T -1.4 DEXA (12/2004): L-spine T -2.6, left hip -0.1   . Allergic rhinitis 06/01/2012  . Bilateral cataracts 08/04/2012    Visually insignificant   . Chronic constipation 02/03/2011  . Chronic venous insufficiency 08/04/2012  . Paroxysmal atrial fibrillation 10/22/2010    s/p Left atrial maze procedure for paroxysmal atrial fibrillation on 09/20/2010 by Dr Prescott Gum.  Subsequent splenic infarct, decision was made to re-anticoagulate with coumadin, likely life-long as this is the most likely cause of the splenic infarct.   . Chronic low back pain 10/06/2012  . Anemia of chronic disease 01/01/2013  . Obstructive sleep apnea     Nocturnal polysomnography (06/2009): Moderate sleep apnea/ hypopnea syndrome , AHI 17.8 per hour with nonpositional hypopneas. CPAP titration to 12 CWP, AHI 2.4 per hour. On nocturnal CPAP via a small resMed Quattro full-face mask with heated humidifier.   . Type 2 diabetes mellitus with diabetic neuropathy   . History of blood transfusion     "several times"   . Clear cell renal cell carcinoma  07/21/2011    s/p cryoablation of left RCC in 09/2011 by [name]. Followed by [name]  [facility]) .    Marland Kitchen Chronic daily headache 01/16/2014  . Right nephrolithiasis 09/06/2014    5 mm non-obstructing calculus seen on CT scan 09/05/2014     Assessment: 60 yof admitted for rib fractures. Initially MD resumed home dose but now warfarin per pharmacy for hx afib (pta).  INR 1.71 down from 1.82 s/p 23m dose yesterday. No current CBC but, previous labs  stable. No bleeding reported.  PTA warfarin dose: 2.520mdaily, except 46m54mn Mon (last dose on 9/1 pta)   Goal of Therapy:  INR 2-3 Monitor platelets by anticoagulation protocol: Yes   Plan:  Warfarin 46mg81m1 dose tonight Daily INR, mon s/sx bleeding  Rob Stephens NovemberarmD PGY1 Resident Pager [phone]/2016 9:20 AM

## 2014-10-16 NOTE — Discharge Summary (Signed)
Name: [name] MRN: [id number] DOB: [birth year] 64 y.o. PCP: Kathryn Linsey, MD  Date of Admission: 10/13/2014  6:08 PM Date of Discharge: 10/16/2014 Attending Physician: No att. providers found  Discharge Diagnosis: 1. Acute right 6th and 8th displaced rib fractures   Active Problems:   Type 2 diabetes mellitus with diabetic neuropathy   Pulmonary emphysema   Moderate to severe pulmonary hypertension   Paroxysmal atrial fibrillation   Right rib fracture   Fracture of ribs, two, closed   Intractable pain  Discharge Medications:   Medication List    STOP taking these medications        fluconazole 150 MG tablet  Commonly known as:  DIFLUCAN      TAKE these medications        ACCU-CHEK FASTCLIX LANCET Kit  Check your blood 4 times a day dx code 250.00 insulin requiring     albuterol 108 (90 BASE) MCG/ACT inhaler  Commonly known as:  PROAIR HFA  Inhale 2 puffs into the lungs every 6 (six) hours as needed for wheezing or shortness of breath.     ALPRAZolam 1 MG tablet  Commonly known as:  XANAX  Take 0.5-1 tablets (0.5-1 mg total) by mouth 2 (two) times daily as needed for anxiety. for anxiety     buPROPion 100 MG tablet  Commonly known as:  WELLBUTRIN  Take 1 tablet (100 mg total) by mouth 2 (two) times daily.     Calcium Carb-Cholecalciferol 600-800 MG-UNIT Tabs  Take 1 tablet by mouth 2 (two) times daily.     chlorpheniramine 4 MG tablet  Commonly known as:  CHLOR-TRIMETON  Take 1 tablet (4 mg total) by mouth 3 (three) times daily as needed for allergies.     cyclobenzaprine 5 MG tablet  Commonly known as:  FLEXERIL  Take 1 tablet (5 mg total) by mouth 3 (three) times daily.     EASY TOUCH PEN NEEDLES 31G X 5 MM Misc  Generic drug:  Insulin Pen Needle  USE TO INJECT INSULIN 5 TIMES DAILY     fluticasone 50 MCG/ACT nasal spray  Commonly known as:  FLONASE  Place 2 sprays into both nostrils daily.     Fluticasone-Salmeterol 250-50 MCG/DOSE Aepb    Commonly known as:  ADVAIR DISKUS  Inhale 1 puff into the lungs 2 (two) times daily.     furosemide 80 MG tablet  Commonly known as:  LASIX  Take 1 tablet (80 mg total) by mouth 2 (two) times daily.     gabapentin 300 MG capsule  Commonly known as:  NEURONTIN  Take 1 capsule (300 mg total) by mouth 3 (three) times daily.     HYDROcodone-acetaminophen 5-325 MG per tablet  Commonly known as:  NORCO/VICODIN  Take 1-2 tablets by mouth every 6 (six) hours as needed for severe pain.     HYDROmorphone 2 MG tablet  Commonly known as:  DILAUDID  Take 0.5 tablets (1 mg total) by mouth every 4 (four) hours as needed for severe pain.     ibuprofen 200 MG tablet  Commonly known as:  ADVIL,MOTRIN  Take 400 mg by mouth every 6 (six) hours as needed for moderate pain.     insulin aspart cartridge  Commonly known as:  NOVOLOG  Inject 18 Units into the skin 3 (three) times daily with meals.     Insulin Detemir 100 UNIT/ML Pen  Commonly known as:  LEVEMIR  Inject 45 Units into the skin 2 (two)  times daily.     lidocaine 2 % jelly  Commonly known as:  XYLOCAINE  Apply 1 application topically 3 (three) times daily. Apply gel to the affected area three times daily.     nystatin cream  Commonly known as:  MYCOSTATIN  Apply 1 application topically 2 (two) times daily.     omeprazole 40 MG capsule  Commonly known as:  PRILOSEC  Take 1 capsule (40 mg total) by mouth 2 (two) times daily.     ONETOUCH VERIO test strip  Generic drug:  glucose blood  Check blood sugar up to 4 times a day     ONETOUCH VERIO W/DEVICE Kit  1 each by Does not apply route 4 (four) times daily.     potassium chloride SA 20 MEQ tablet  Commonly known as:  K-DUR,KLOR-CON  Take 1 tablet (20 mEq total) by mouth 2 (two) times daily.     rosuvastatin 20 MG tablet  Commonly known as:  CRESTOR  Take 1 tablet (20 mg total) by mouth at bedtime.     sertraline 100 MG tablet  Commonly known as:  ZOLOFT  Take 2 tablets  (200 mg total) by mouth daily.     tiotropium 18 MCG inhalation capsule  Commonly known as:  SPIRIVA HANDIHALER  Place 1 capsule (18 mcg total) into inhaler and inhale daily.     warfarin 5 MG tablet  Commonly known as:  COUMADIN  Take 1/2 tablet (2.5 mg) every day except on Mondays take 1 tablet (5 mg)        Disposition and follow-up:   [name] was discharged from [facility] in Stable condition.  At the hospital follow up visit please address:  1.  Her pain secondary to her rib fracture of the 6th and 8th rib on the right.  On admission, her pain was managed with IV Dilaudid and she was then transitioned to PO Dilaudid.  She was given instruction to abstain from her home Vicodin while on Dilaudid.  She was given a total of #15 Dilaudid 31m tablets.  She complained of muscle spasm related to her ribcage pain and was on scheduled Flexeril 571mtid while inpatient.  She was also given a short course of Flexeril at discharge to help manage her pain.  Also, address her receptiveness to initiating bisphosphonate therapy for her osteoporosis. She has been resistant to this in the past, but perhaps she will be more amenable to the idea now after her hospitalization.  2.  Labs / imaging needed at time of follow-up: none  3.  Pending labs/ test needing follow-up: none  Follow-up Appointments: Follow-up Information    Follow up with KLKarren CobbleMD. Schedule an appointment as soon as possible for a visit in 1 week.   Specialty:  Internal Medicine   Why:  They will call you to set up appointment   Contact information:   [phone]     Procedures Performed:  Dg Ribs Unilateral W/chest Right  10/13/2014   CLINICAL DATA:  Status post fall, with right flank pain. Initial encounter.  EXAM: RIGHT RIBS AND CHEST - 3+ VIEW  COMPARISON:  Chest and right rib radiographs performed 10/11/2014  FINDINGS: There is a minimally  displaced fracture of the right eighth posterolateral rib.  The lungs are well-aerated. Mild vascular congestion is noted. There is no evidence of focal opacification, pleural effusion or pneumothorax.  The cardiomediastinal  silhouette is borderline normal in size. The patient is status post median sternotomy. No acute osseous abnormalities are seen. A plate and screws are noted along the right clavicle.  IMPRESSION: 1. Minimally displaced fracture of the right eighth posterolateral rib. 2. Mild vascular congestion noted.  Lungs remain grossly clear.   Electronically Signed   By: Garald Balding M.D.   On: 10/13/2014 21:33   Dg Ribs Unilateral W/chest Right  10/11/2014   CLINICAL DATA:  Fall 3 weeks ago. Worsening right rib pain and shortness of breath. Initial encounter.  EXAM: RIGHT RIBS AND CHEST - 3+ VIEW  COMPARISON:  05/10/2014  FINDINGS: No fracture or other bone lesions are seen involving the ribs. There is no evidence of pneumothorax or pleural effusion.  Both lungs are clear. Heart size and mediastinal contours are within normal limits. Patient has undergone previous median sternotomy. Fixation plate and screws again seen in the right clavicle.  IMPRESSION: No acute right rib fractures identified. No active cardiopulmonary disease.   Electronically Signed   By: Earle Gell M.D.   On: 10/11/2014 17:51   Ct Angio Chest Pe W/cm &/or Wo Cm  10/13/2014   CLINICAL DATA:  Chest pain and cough.  EXAM: CT ANGIOGRAPHY CHEST WITH CONTRAST  TECHNIQUE: Multidetector CT imaging of the chest was performed using the standard protocol during bolus administration of intravenous contrast. Multiplanar CT image reconstructions and MIPs were obtained to evaluate the vascular anatomy.  CONTRAST:  162m OMNIPAQUE IOHEXOL 350 MG/ML SOLN  COMPARISON:  09/16/2010  FINDINGS: Cardiovascular: There is good opacification of the pulmonary arteries. There is no pulmonary embolism. The thoracic aorta is normal in caliber and intact.   Lungs: Centrilobular emphysematous changes are present, upper lobe predominant. Minimal interstitial coarsening in the lateral periphery of the inferior lower lobes, without confluent airspace consolidation.  Central airways: Patent  Effusions: None  Lymphadenopathy: Small nodes in the mediastinum and both hila, substantially decreased from 09/16/2010 and nonspecific.  Esophagus: Unremarkable  Upper abdomen: Unremarkable  Musculoskeletal: There are mildly displaced fractures of the right sixth and eighth ribs posteriorly and posterolaterally. No significant musculoskeletal lesion is evident.  Review of the MIP images confirms the above findings.  IMPRESSION: 1. Negative for pulmonary embolism 2. Emphysematous changes.  Mild peripheral interstitial coarsening. 3. Acute mildly displaced right sixth and right eighth rib fractures.   Electronically Signed   By: DAndreas NewportM.D.   On: 10/13/2014 23:19    Admission HPI: Ms. ERiemenschneideris a 64yo female with CHF, paroxysmal AFib, moderate to severe pulmonary HTN, COPD, and osteoporosis, presenting with one week h/o right sided flank and back pain. She reports that she sustained 2 falls 3 weeks - 1 month ago, falling out of her recliner while asleep both times. She reports that she slid out while sleeping once, and fell forward while sleeping once. She denies injury during the first fall, and endorses hitting her head on the second fall, but denies any subsequent bruising. Now, she complains of right sided flank pain that radiates around to her abdomen and around to her back. Pain is worse with cough. She denies any recent trauma immediately prior to her rib pain. She was seen twice in IM clinic for the pain. Xray was deferred the first time, with subsequent Xray noting single 8th rib fracture. CT scan in the ED tonight noted 6th and 8th rib fractures. She has a h/o rib fractures 2/2 her underlying osteoporosis.  She endorses one episode of loose  stool.  She denies fever, chills, CP, SOB, N/V, constipation, abdominal pain, or changes in urinary habits.   Hospital Course by problem list: Active Problems:   Type 2 diabetes mellitus with diabetic neuropathy   Pulmonary emphysema   Moderate to severe pulmonary hypertension   Paroxysmal atrial fibrillation   Right rib fracture   Fracture of ribs, two, closed   Intractable pain   1. Acute right 6th and 8th displaced rib fractures: on admission, patient had a CTA chest that revealed mildly displaced rib fractures of the right 6th and 8th ribs.  Suspect this is likely related to her osteoporosis.  Her pain control was managed initially with Dilaudid IV, Flexeril 29m prn, Vicodin prn, and Lidoderm patch q24h.  Subsequently, she was transitioned to Dilaudid PO, Flexeril 549mtid, and Lidoderm patch q24h.  Her Vicodin prn was discontinued.  The reason for scheduling the Flexeril instead of dosing as needed was made because she complained of muscle spasm contributing to her rib pain and so it was felt that this scheduled dosing would be helpful.  After 24 hours of controlling her pain with only oral medications, it was determined that [name] would be stable for discharge with appropriate outpatient follow-up.  She was sent home with a short course of PO Dilaudid and PO Flexeril with instruction to abstain from her Vicodin during this time.  She was also instructed that if her pain eased off, that she could discontinue the Dilaudid and restart her chronic Vicodin dosing.  She was also encouraged throughout her hospitalization to use her incentive spirometer.  Discharge Vitals:   BP 132/34 mmHg  Pulse 76  Temp(Src) 98.2 F (36.8 C) (Oral)  Resp 17  Ht _0  (1.575 m)  Wt 192 lb 1.4 oz (87.13 kg)  BMI 35.12 kg/m2  SpO2 95%  Discharge Labs:  Results for orders placed or performed during the hospital encounter of 10/13/14 (from the past 24 hour(s))  Glucose, capillary     Status: Abnormal    Collection Time: 10/15/14  4:49 PM  Result Value Ref Range   Glucose-Capillary 166 (H) 65 - 99 mg/dL   Comment 1 Notify RN   Glucose, capillary     Status: Abnormal   Collection Time: 10/15/14  9:49 PM  Result Value Ref Range   Glucose-Capillary 267 (H) 65 - 99 mg/dL  Protime-INR     Status: Abnormal   Collection Time: 10/16/14  4:50 AM  Result Value Ref Range   Prothrombin Time 20.1 (H) 11.6 - 15.2 seconds   INR 1.71 (H) 0.00 - 1.49  Glucose, capillary     Status: Abnormal   Collection Time: 10/16/14  6:47 AM  Result Value Ref Range   Glucose-Capillary 188 (H) 65 - 99 mg/dL  Glucose, capillary     Status: Abnormal   Collection Time: 10/16/14 11:38 AM  Result Value Ref Range   Glucose-Capillary 164 (H) 65 - 99 mg/dL   Comment 1 Notify RN    *Note: Due to a large number of results and/or encounters for the requested time period, some results have not been displayed. A complete set of results can be found in Results Review.    Signed: AnJule SerDO 10/16/2014, 4:35 PM    Services Ordered on Discharge: none Equipment Ordered on Discharge: none

## 2014-10-17 ENCOUNTER — Other Ambulatory Visit: Payer: Self-pay | Admitting: Internal Medicine

## 2014-10-17 DIAGNOSIS — F419 Anxiety disorder, unspecified: Secondary | ICD-10-CM

## 2014-10-17 DIAGNOSIS — S2231XS Fracture of one rib, right side, sequela: Secondary | ICD-10-CM

## 2014-10-17 DIAGNOSIS — I5032 Chronic diastolic (congestive) heart failure: Secondary | ICD-10-CM

## 2014-10-17 NOTE — Progress Notes (Signed)
Internal Medicine Clinic Attending  Case discussed with Dr. Rivet soon after the resident saw the patient.  We reviewed the resident's history and exam and pertinent patient test results.  I agree with the assessment, diagnosis, and plan of care documented in the resident's note.  

## 2014-10-18 NOTE — Telephone Encounter (Signed)
Called to pharm 

## 2014-10-19 ENCOUNTER — Emergency Department (HOSPITAL_COMMUNITY): Payer: PPO

## 2014-10-19 ENCOUNTER — Telehealth: Payer: Self-pay | Admitting: Internal Medicine

## 2014-10-19 ENCOUNTER — Inpatient Hospital Stay (HOSPITAL_COMMUNITY)
Admission: EM | Admit: 2014-10-19 | Discharge: 2014-10-26 | DRG: 092 | Disposition: A | Discharge status: Home / Self Care | Payer: PPO | Attending: Internal Medicine | Admitting: Internal Medicine

## 2014-10-19 ENCOUNTER — Encounter (HOSPITAL_COMMUNITY): Payer: Self-pay | Admitting: *Deleted

## 2014-10-19 ENCOUNTER — Encounter: Payer: Self-pay | Admitting: Internal Medicine

## 2014-10-19 DIAGNOSIS — J449 Chronic obstructive pulmonary disease, unspecified: Secondary | ICD-10-CM | POA: Diagnosis present

## 2014-10-19 DIAGNOSIS — M810 Age-related osteoporosis without current pathological fracture: Secondary | ICD-10-CM | POA: Diagnosis present

## 2014-10-19 DIAGNOSIS — S2241XA Multiple fractures of ribs, right side, initial encounter for closed fracture: Secondary | ICD-10-CM | POA: Diagnosis present

## 2014-10-19 DIAGNOSIS — S2241XD Multiple fractures of ribs, right side, subsequent encounter for fracture with routine healing: Secondary | ICD-10-CM | POA: Diagnosis not present

## 2014-10-19 DIAGNOSIS — M62838 Other muscle spasm: Secondary | ICD-10-CM | POA: Diagnosis present

## 2014-10-19 DIAGNOSIS — E118 Type 2 diabetes mellitus with unspecified complications: Secondary | ICD-10-CM | POA: Diagnosis present

## 2014-10-19 DIAGNOSIS — R7989 Other specified abnormal findings of blood chemistry: Secondary | ICD-10-CM | POA: Diagnosis not present

## 2014-10-19 DIAGNOSIS — Z8781 Personal history of (healed) traumatic fracture: Secondary | ICD-10-CM | POA: Diagnosis not present

## 2014-10-19 DIAGNOSIS — R0781 Pleurodynia: Secondary | ICD-10-CM | POA: Insufficient documentation

## 2014-10-19 DIAGNOSIS — E1140 Type 2 diabetes mellitus with diabetic neuropathy, unspecified: Secondary | ICD-10-CM | POA: Diagnosis present

## 2014-10-19 DIAGNOSIS — Z7951 Long term (current) use of inhaled steroids: Secondary | ICD-10-CM

## 2014-10-19 DIAGNOSIS — Z9181 History of falling: Secondary | ICD-10-CM

## 2014-10-19 DIAGNOSIS — K219 Gastro-esophageal reflux disease without esophagitis: Secondary | ICD-10-CM | POA: Diagnosis present

## 2014-10-19 DIAGNOSIS — Z7901 Long term (current) use of anticoagulants: Secondary | ICD-10-CM

## 2014-10-19 DIAGNOSIS — E1169 Type 2 diabetes mellitus with other specified complication: Secondary | ICD-10-CM | POA: Diagnosis present

## 2014-10-19 DIAGNOSIS — F17200 Nicotine dependence, unspecified, uncomplicated: Secondary | ICD-10-CM | POA: Diagnosis present

## 2014-10-19 DIAGNOSIS — R52 Pain, unspecified: Secondary | ICD-10-CM | POA: Diagnosis present

## 2014-10-19 DIAGNOSIS — F329 Major depressive disorder, single episode, unspecified: Secondary | ICD-10-CM | POA: Diagnosis present

## 2014-10-19 DIAGNOSIS — Z720 Tobacco use: Secondary | ICD-10-CM | POA: Diagnosis present

## 2014-10-19 DIAGNOSIS — I5032 Chronic diastolic (congestive) heart failure: Secondary | ICD-10-CM | POA: Diagnosis present

## 2014-10-19 DIAGNOSIS — J439 Emphysema, unspecified: Secondary | ICD-10-CM | POA: Diagnosis present

## 2014-10-19 DIAGNOSIS — Z85528 Personal history of other malignant neoplasm of kidney: Secondary | ICD-10-CM

## 2014-10-19 DIAGNOSIS — G4733 Obstructive sleep apnea (adult) (pediatric): Secondary | ICD-10-CM | POA: Diagnosis present

## 2014-10-19 DIAGNOSIS — G8929 Other chronic pain: Principal | ICD-10-CM | POA: Diagnosis present

## 2014-10-19 DIAGNOSIS — E119 Type 2 diabetes mellitus without complications: Secondary | ICD-10-CM

## 2014-10-19 DIAGNOSIS — I272 Other secondary pulmonary hypertension: Secondary | ICD-10-CM | POA: Diagnosis present

## 2014-10-19 DIAGNOSIS — I480 Paroxysmal atrial fibrillation: Secondary | ICD-10-CM | POA: Diagnosis present

## 2014-10-19 DIAGNOSIS — F17210 Nicotine dependence, cigarettes, uncomplicated: Secondary | ICD-10-CM

## 2014-10-19 DIAGNOSIS — M8000XA Age-related osteoporosis with current pathological fracture, unspecified site, initial encounter for fracture: Secondary | ICD-10-CM | POA: Diagnosis present

## 2014-10-19 DIAGNOSIS — W19XXXD Unspecified fall, subsequent encounter: Secondary | ICD-10-CM | POA: Diagnosis not present

## 2014-10-19 DIAGNOSIS — I700 Atherosclerosis of aorta: Secondary | ICD-10-CM

## 2014-10-19 DIAGNOSIS — E785 Hyperlipidemia, unspecified: Secondary | ICD-10-CM | POA: Diagnosis present

## 2014-10-19 DIAGNOSIS — Z9981 Dependence on supplemental oxygen: Secondary | ICD-10-CM

## 2014-10-19 DIAGNOSIS — Z794 Long term (current) use of insulin: Secondary | ICD-10-CM

## 2014-10-19 DIAGNOSIS — Z952 Presence of prosthetic heart valve: Secondary | ICD-10-CM

## 2014-10-19 DIAGNOSIS — Z79899 Other long term (current) drug therapy: Secondary | ICD-10-CM

## 2014-10-19 DIAGNOSIS — M797 Fibromyalgia: Secondary | ICD-10-CM | POA: Diagnosis present

## 2014-10-19 DIAGNOSIS — Z79891 Long term (current) use of opiate analgesic: Secondary | ICD-10-CM

## 2014-10-19 DIAGNOSIS — R710 Precipitous drop in hematocrit: Secondary | ICD-10-CM | POA: Diagnosis not present

## 2014-10-19 DIAGNOSIS — M8000XD Age-related osteoporosis with current pathological fracture, unspecified site, subsequent encounter for fracture with routine healing: Secondary | ICD-10-CM | POA: Diagnosis present

## 2014-10-19 DIAGNOSIS — M545 Low back pain: Secondary | ICD-10-CM | POA: Diagnosis present

## 2014-10-19 HISTORY — DX: Atherosclerosis of aorta: I70.0

## 2014-10-19 LAB — CBC WITH DIFFERENTIAL/PLATELET
Basophils Absolute: 0 10*3/uL (ref 0.0–0.1)
Basophils Relative: 0 % (ref 0–1)
Eosinophils Absolute: 0.1 10*3/uL (ref 0.0–0.7)
Eosinophils Relative: 1 % (ref 0–5)
HCT: 34.7 % — ABNORMAL LOW (ref 36.0–46.0)
Hemoglobin: 10.5 g/dL — ABNORMAL LOW (ref 12.0–15.0)
Lymphocytes Relative: 18 % (ref 12–46)
Lymphs Abs: 1.8 10*3/uL (ref 0.7–4.0)
MCH: 27.3 pg (ref 26.0–34.0)
MCHC: 30.3 g/dL (ref 30.0–36.0)
MCV: 90.1 fL (ref 78.0–100.0)
Monocytes Absolute: 1.3 10*3/uL — ABNORMAL HIGH (ref 0.1–1.0)
Monocytes Relative: 13 % — ABNORMAL HIGH (ref 3–12)
Neutro Abs: 6.8 10*3/uL (ref 1.7–7.7)
Neutrophils Relative %: 68 % (ref 43–77)
Platelets: 294 10*3/uL (ref 150–400)
RBC: 3.85 MIL/uL — ABNORMAL LOW (ref 3.87–5.11)
RDW: 16.5 % — ABNORMAL HIGH (ref 11.5–15.5)
WBC: 10 10*3/uL (ref 4.0–10.5)

## 2014-10-19 LAB — BASIC METABOLIC PANEL
Anion gap: 10 (ref 5–15)
BUN: 15 mg/dL (ref 6–20)
CO2: 29 mmol/L (ref 22–32)
Calcium: 9.5 mg/dL (ref 8.9–10.3)
Chloride: 99 mmol/L — ABNORMAL LOW (ref 101–111)
Creatinine, Ser: 1.06 mg/dL — ABNORMAL HIGH (ref 0.44–1.00)
GFR calc Af Amer: >60 mL/min (ref >60)
GFR calc non Af Amer: 54 mL/min — ABNORMAL LOW (ref >60)
Glucose, Bld: 134 mg/dL — ABNORMAL HIGH (ref 65–99)
Potassium: 3.7 mmol/L (ref 3.5–5.1)
Sodium: 138 mmol/L (ref 135–145)

## 2014-10-19 MED ORDER — HYDROMORPHONE HCL 1 MG/ML IJ SOLN
1.0000 mg | Freq: Once | INTRAMUSCULAR | Status: AC
  Administered 2014-10-19: 1 mg via INTRAVENOUS
  Filled 2014-10-19: qty 1

## 2014-10-19 MED ORDER — HYDROMORPHONE HCL 1 MG/ML IJ SOLN
0.5000 mg | Freq: Once | INTRAMUSCULAR | Status: AC
  Administered 2014-10-19: 0.5 mg via INTRAVENOUS
  Filled 2014-10-19: qty 1

## 2014-10-19 NOTE — Telephone Encounter (Signed)
Thanks I agree. She should seek emergent care in ER.

## 2014-10-19 NOTE — Telephone Encounter (Signed)
Called pt back to see if she had called EMS yet, she states no, she is not going to call them, states she is suffering and she knows it will be like when she was in hospital, she states she was not cared for in a good manner, she states she was ignored. i told her she could request not to go back to that unit and she could speak to someone but she states no she doesn't want to cause trouble. She states the pain med, dilaudid is not working though this time speaking with her she sounds drowsy and speech is a little slurred, states it has always worked before but it didn't do well in the hospital and maybe it was watered down and only taking 1/2 a pill just does not help. i tried to get her to agree to go to ED several times and she refuses. i ask her to call back if there was anything imc could do to help her and she was agreeable

## 2014-10-19 NOTE — Telephone Encounter (Signed)
She called from the EMS truck, she is enroute to ED

## 2014-10-19 NOTE — Telephone Encounter (Signed)
Pt called requesting the nurse to call back. °

## 2014-10-19 NOTE — Telephone Encounter (Signed)
Pt calls and states she is worse than she was in the hospital, states the pain between her shoulders is unbearable. She is advised to call 911 and go to [place]. She states she has made an appt for Monday in clinic. She was ask if she will be able to tolerate the pain til mon and states no, she is again advised to call 911 and go to ED. She finally agrees
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
## 2014-10-19 NOTE — Telephone Encounter (Signed)
Pt called back and stated someone called her, i informed her that dr Eppie Gibson had possibly called her and that he highly advised for her to go to ED, she states she will do so

## 2014-10-19 NOTE — ED Provider Notes (Signed)
CSN: [id number]     Arrival date & time 10/19/14  1625 History   First MD Initiated Contact with Patient 10/19/14 1704     Chief Complaint  Patient presents with  . rib cage pain      HPI   [name] is a 64 y.o. female with a PMH of COPD, pulmonary HTN, tobacco abuse, CHF, osteoporosis who presents to the ED with right sided rib pain and back pain. She was recently admitted for right 6th and 8th displaced rib fractures; she was discharged 09/05. Reports worsening pain since the time of her discharge, and states the pain medication she was given has not been effective in controlling her pain. States her symptoms are constant. Movement exacerbates her pain; her prescribed pain medication has given her minimal relief. She denies fever, chills, lightheadedness, syncope. Reports intermittent dizziness. Denies chest pain, shortness of breath, abdominal pain, N/V/D/C, dysuria, urgency, frequency. Reports cough, unchanged from baseline.  Past Medical History  Diagnosis Date  . COPD (chronic obstructive pulmonary disease) with emphysema     PFTs 02/2012: FEV1 0.92 (40%), ratio 69, 27% increase in FEV1 with BD, TLC 91%, severe airtrapping, DLCO49% On chronic home O2. Pulmonary rehab referral 05/2012   . Moderate to severe pulmonary hypertension     2014 TEE w PA peak pressure 46 mmHg, s/p MV replacement   . Tobacco abuse 07/28/2012  . Hyperlipidemia LDL goal < 100 11/20/2005  . Carotid artery stenosis     s/p right endarterectomy (06/2010) Carotid US (07/2010):  Left: Moderate-to-severe (60-79%) calcific and non-calcific plaque origin and proximal ICA and ECA   . Mitral stenosis     s/p Mitral valve replacement with a 27-mm pericardial porcine valve (Medtronic Mosaic valve, serial #[id number] on 09/20/10, Dr. Prescott Gum)   . Chronic congestive heart failure with left ventricular diastolic dysfunction 3/55/9741  . Depression 11/19/2005  . Anxiety 07/24/2010  . Fibromyalgia 08/29/2010  . Obesity (BMI  30.0-34.9) 10/23/2011  . Adenomatous polyps 05/14/2011    Colonoscopy (05/2011): 4 mm adenomatous polyp excised endoscopically Colonoscopy (02/2002): Adenomatous polyp excised endoscopically   . Gastroesophageal reflux disease   . Internal hemorrhoids 08/04/2012  . Osteoporosis     DEXA (12/09/2011): L-spine T -3.7, left hip T -1.4 DEXA (12/2004): L-spine T -2.6, left hip -0.1   . Allergic rhinitis 06/01/2012  . Bilateral cataracts 08/04/2012    Visually insignificant   . Chronic constipation 02/03/2011  . Chronic venous insufficiency 08/04/2012  . Paroxysmal atrial fibrillation 10/22/2010    s/p Left atrial maze procedure for paroxysmal atrial fibrillation on 09/20/2010 by Dr Prescott Gum.  Subsequent splenic infarct, decision was made to re-anticoagulate with coumadin, likely life-long as this is the most likely cause of the splenic infarct.   . Chronic low back pain 10/06/2012  . Anemia of chronic disease 01/01/2013  . Obstructive sleep apnea     Nocturnal polysomnography (06/2009): Moderate sleep apnea/ hypopnea syndrome , AHI 17.8 per hour with nonpositional hypopneas. CPAP titration to 12 CWP, AHI 2.4 per hour. On nocturnal CPAP via a small resMed Quattro full-face mask with heated humidifier.   . Type 2 diabetes mellitus with diabetic neuropathy   . History of blood transfusion     "several times"   . Clear cell renal cell carcinoma 07/21/2011    s/p cryoablation of left RCC in 09/2011 by [name]. Followed by [name]  [facility]) .    Marland Kitchen Chronic daily headache 01/16/2014  . Right nephrolithiasis 09/06/2014  5 mm non-obstructing calculus seen on CT scan 09/05/2014   . Aortic atherosclerosis 10/19/2014    Seen on CT scan, currently asymptomatic   Past Surgical History  Procedure Laterality Date  . Orif clavicle fracture  01/2004    by Thana Farr. [name], M.D for Right clavicle nonunion.  . Tonsillectomy    . Carotid endarterectomy Right 07/04/2010    by [name] for asymptomatic  right carotid artery stenosis  . Lipoma excision  08/2005    occipital lipoma 1.5cm - by Dr. [name]  . Hysteroscopy with endometrial ablation  06/2001    for persistent post-menopausal bleeding // by S. [name], M.D.  . Mitral valve replacement  09/20/10     with a 27-mm pericardial porcine valve (Medtronic Mosaic valve, serial #[id number]). 09/20/10, Dr Prescott Gum  . Maze Left 09/20/10    for paroxysmal atrial fibrillation (Dr. Prescott Gum)  . Chest tube insertion  09/24/2010    Dr Prescott Gum  . Cardiac valve replacement  Aug. 2012    "mitral valve"  . Colonoscopy  05/12/2011    performed by Dr. Michail Sermon. Showing small internal hemorrhoids, single tubular adenoma polyp  . Esophagogastroduodenoscopy  05/12/2011    performed by Dr. Michail Sermon. Negative for ulcerations, biopsy negative for evidence of celiac sprue  . Tubal ligation    . Eye surgery    . Fracture surgery Right 2005    clavicle  . Dilation and curettage of uterus    . Cryoablation Left 09/2011    by [name]. Followed by [name]  [facility]) .     Family History  Problem Relation Age of Onset  . Peptic Ulcer Disease Father   . Heart attack Father 20    Died of MI at age 96  . Heart attack Brother 64    Died of MI at age 98  . Obesity Brother   . Pneumonia Mother   . Healthy Sister   . Lupus Daughter   . Obsessive Compulsive Disorder Daughter    Social History  Substance Use Topics  . Smoking status: Current Every Day Smoker -- 1.50 packs/day for 49 years    Types: Cigarettes    Start date: 10/31/2013    Last Attempt to Quit: 10/31/2013  . Smokeless tobacco: Never Used     Comment: 1 1/2  PPD down to 1 ppd  . Alcohol Use: No   OB History    No data available     Review of Systems  Constitutional: Negative for fever, chills, activity change, appetite change and fatigue.  Respiratory: Positive for cough. Negative for shortness of breath.   Cardiovascular: Negative for chest pain, palpitations and  leg swelling.  Gastrointestinal: Negative for nausea, vomiting, abdominal pain, diarrhea, constipation and abdominal distention.  Genitourinary: Negative for dysuria, urgency and frequency.  Musculoskeletal: Positive for myalgias, back pain and arthralgias. Negative for neck pain and neck stiffness.       Reports right sided rib and back pain.  Skin: Negative for color change, pallor, rash and wound.  Neurological: Positive for dizziness. Negative for syncope, weakness, light-headedness, numbness and headaches.  All other systems reviewed and are negative.     Allergies  Lorazepam; Morphine and related; Oxycontin; and Tramadol hcl  Home Medications   Prior to Admission medications   Medication Sig Start Date End Date Taking? Authorizing Provider  albuterol (PROAIR HFA) 108 (90 BASE) MCG/ACT inhaler Inhale 2 puffs into the lungs every 6 (six) hours as needed  for wheezing or shortness of breath. 01/11/14   [name], MD  ALPRAZolam [name]) 1 MG tablet Take 0.5-1 tablets (0.5-1 mg total) by mouth 2 (two) times daily as needed for anxiety. 10/18/14   [name], MD  Blood Glucose Monitoring Suppl (ONETOUCH VERIO) W/DEVICE KIT 1 each by Does not apply route 4 (four) times daily. 07/25/14   [name], MD  buPROPion (WELLBUTRIN) 100 MG tablet Take 1 tablet (100 mg total) by mouth 2 (two) times daily. 10/12/14   [name], MD  Calcium Carb-Cholecalciferol 600-800 MG-UNIT TABS Take 1 tablet by mouth 2 (two) times daily.  08/04/12   [name], MD  chlorpheniramine (CHLOR-TRIMETON) 4 MG tablet Take 1 tablet (4 mg total) by mouth 3 (three) times daily as needed for allergies. 10/06/14   [name], MD  cyclobenzaprine (FLEXERIL) 5 MG tablet Take 1 tablet (5 mg total) by mouth 3 (three) times daily as needed for muscle spasms. 10/18/14   [name], MD  EASY TOUCH PEN NEEDLES 31G X 5 MM [place] USE TO INJECT INSULIN 5 TIMES DAILY 08/23/14   [name], MD  fluticasone [facility]) 50  MCG/ACT nasal spray Place 2 sprays into both nostrils daily. 05/23/14   [name], MD  Fluticasone-Salmeterol (ADVAIR DISKUS) 250-50 MCG/DOSE AEPB Inhale 1 puff into the lungs 2 (two) times daily. 05/23/14   [name], MD  furosemide (LASIX) 80 MG tablet Take 1 tablet (80 mg total) by mouth 2 (two) times daily. 07/19/14   [name], MD  gabapentin (NEURONTIN) 300 MG capsule Take 1 capsule (300 mg total) by mouth 3 (three) times daily. 10/06/14   [name], MD  HYDROcodone-acetaminophen (NORCO/VICODIN) 5-325 MG per tablet Take 1-2 tablets by mouth every 6 (six) hours as needed for severe pain. 09/08/14   [name], MD  HYDROmorphone (DILAUDID) 2 MG tablet Take 0.5 tablets (1 mg total) by mouth every 4 (four) hours as needed for severe pain. 10/16/14   Jule Ser, DO  ibuprofen (ADVIL,MOTRIN) 200 MG tablet Take 400 mg by mouth every 6 (six) hours as needed for moderate pain.    Historical Provider, MD  insulin aspart (NOVOLOG) cartridge Inject 18 Units into the skin 3 (three) times daily with meals. Patient taking differently: Inject 18 Units into the skin 3 (three) times daily with meals. 18 units plus sliding scale 02/17/14   [name], MD  Insulin Detemir (LEVEMIR) 100 UNIT/ML Pen Inject 45 Units into the skin 2 (two) times daily. 04/05/14   [name], MD  Lancets Misc. (ACCU-CHEK FASTCLIX LANCET) KIT Check your blood 4 times a day dx code 250.00 insulin requiring 01/19/13   [name], MD  lidocaine (XYLOCAINE) 2 % jelly APPLY TO AFFECTED AREA THREE TIMES A DAY 10/18/14   [name], MD  nystatin cream (MYCOSTATIN) Apply 1 application topically 2 (two) times daily. 02/17/14   [name], MD  omeprazole (PRILOSEC) 40 MG capsule Take 1 capsule (40 mg total) by mouth 2 (two) times daily. 06/28/14 06/28/15  Karlene Einstein, MD  [facility] VERIO test strip Check blood sugar up to 4 times a day 07/25/14   [name], MD  potassium chloride SA (K-DUR,KLOR-CON) 20 MEQ  tablet Take 1 tablet (20 mEq total) by mouth 2 (two) times daily. 10/18/14   [name], MD  rosuvastatin (CRESTOR) 20 MG tablet Take 1 tablet (20 mg total) by mouth at bedtime. 10/12/14   [name], MD  sertraline (ZOLOFT) 100 MG tablet Take 2 tablets (200 mg  total) by mouth daily. 10/12/14   [name], MD  tiotropium (SPIRIVA HANDIHALER) 18 MCG inhalation capsule Place 1 capsule (18 mcg total) into inhaler and inhale daily. 10/12/14   [name], MD  warfarin (COUMADIN) 5 MG tablet Take 1/2 tablet (2.5 mg) every day except on Mondays take 1 tablet (5 mg) 09/21/14   [name], MD    BP 128/44 mmHg  Pulse 68  Temp(Src) 98.1 F (36.7 C) (Oral)  Resp 20  Ht _0  (1.575 m)  Wt 192 lb (87.091 kg)  BMI 35.11 kg/m2  SpO2 94% Physical Exam  Constitutional: She is oriented to person, place, and time. She appears well-developed and well-nourished. She appears distressed.  Obese female in mild distress due to pain.  HENT:  Head: Normocephalic and atraumatic.  Right Ear: External ear normal.  Left Ear: External ear normal.  Nose: Nose normal.  Mouth/Throat: Uvula is midline, oropharynx is clear and moist and mucous membranes are normal.  Eyes: Conjunctivae, EOM and lids are normal. Pupils are equal, round, and reactive to light. Right eye exhibits no discharge. Left eye exhibits no discharge. No scleral icterus.  Neck: Normal range of motion. Neck supple.  Cardiovascular: Normal rate, regular rhythm, normal heart sounds, intact distal pulses and normal pulses.   Pulmonary/Chest: Effort normal. No respiratory distress. She has no wheezes. She has rales.  Rales to lung bases bilaterally.  Abdominal: Soft. Normal appearance and bowel sounds are normal. She exhibits no distension and no mass. There is no tenderness. There is no rigidity, no rebound and no guarding.  Musculoskeletal: Normal range of motion. She exhibits tenderness. She exhibits no edema.  TTP of right thoracic  paraspinal muscles. No midline tenderness, step-off, or deformity. TTP of anterior and axillary aspect of right lower ribs. No crepitus or palpable deformity.  Neurological: She is alert and oriented to person, place, and time.  Skin: Skin is warm, dry and intact. No rash noted. She is not diaphoretic. No erythema. No pallor.  Psychiatric: She has a normal mood and affect. Her speech is normal and behavior is normal. Judgment and thought content normal.  Nursing note and vitals reviewed.   ED Course  Procedures (including critical care time)  Labs Review Labs Reviewed  CBC WITH DIFFERENTIAL/PLATELET - Abnormal; Notable for the following:    RBC 3.85 (*)    Hemoglobin 10.5 (*)    HCT 34.7 (*)    RDW 16.5 (*)    Monocytes Relative 13 (*)    Monocytes Absolute 1.3 (*)    All other components within normal limits  BASIC METABOLIC PANEL - Abnormal; Notable for the following:    Chloride 99 (*)    Glucose, Bld 134 (*)    Creatinine, Ser 1.06 (*)    GFR calc non Af Amer 54 (*)    All other components within normal limits  PROTIME-INR    Imaging Review Dg Chest 2 View  10/19/2014   CLINICAL DATA:  Right flank pain for 3 weeks. Eighth rib fracture diagnosed on x-ray 10/13/2014. History of diabetes and smoking. History of AFib and MVR.  EXAM: CHEST  2 VIEW  COMPARISON:  Chest CT and chest x-ray on 10/13/2014  FINDINGS: Heart is mildly enlarged. Lungs are clear. Patient has had previous median sternotomy and right clavicle ORIF. Deformity of the right posterior lateral sixth, seventh, and eighth ribs consistent with subacute fractures. No pneumothorax.  IMPRESSION: Cardiomegaly.  Subacute right rib fractures.   Electronically Signed   By:  Nolon Nations M.D.   On: 10/19/2014 18:22     I have personally reviewed and evaluated these images and lab results as part of my medical decision-making.   EKG Interpretation None      MDM   Final diagnoses:  Rib pain on right side  History of  rib fracture    64 year old female presents with right sided rib and back pain. Was recently admitted for right 6th and 8th displaced rib fractures. Discharged 09/05 and reports worsening symptoms since that time despite prescribed pain medication.  Patient is afebrile. Vitals stable. O2 sat 99% on RA. Patient in mild distress due to pain. Rales to bilateral lung bases. Significant TTP over anterior and axillary aspect of right lower ribs and right thoracic paraspinal muscles. No midline tenderness, step-off, or deformity.  CBC negative for leukocytosis; anemia appears chronic and stable. BMP unremarkable. CXR with deformity of right posterior lateral sixth, seventh, and eighth ribs. No pneumothorax.  Patient given dilaudid in the ED for pain control. Patient drowsy s/p pain medicine, but when she wakes up, she still complains of severe pain to her right lower ribs with movement and on palpation. Patient to be admitted. Dr. Ralene Bathe spoke with internal medicine, who will admit.   BP 129/90 mmHg  Pulse 70  Temp(Src) 98.1 F (36.7 C) (Oral)  Resp 19  Ht 5' 2" (1.575 m)  Wt 192 lb (87.091 kg)  BMI 35.11 kg/m2  SpO2 99%     Marella Chimes, PA-C 10/19/14 [place], [place] 10/19/14 1954  Quintella Reichert, MD 10/20/14 [phone]

## 2014-10-19 NOTE — ED Notes (Signed)
Pt arrives via EMS from home. Pt sates that she was discharged from the hospital on Tuesday after being admitted for right rib fractures. States that he medicine that they sent her home with for pain has not been working.  Rib fractures occurred over two weeks ago and pt does not recall how.

## 2014-10-19 NOTE — Telephone Encounter (Signed)
I called to encourage she present to the ED for re-evaluation.  I received an identified answering machine and I left a message encouraging her to present to the ED as this was the fasted way we could get better control of her pain and reassess her need for inpatient admission and further readjustment in her regimen.

## 2014-10-19 NOTE — Telephone Encounter (Signed)
Pt stated she is in a lots of pain. Please call pt back.

## 2014-10-19 NOTE — Telephone Encounter (Signed)
I also agree with the need to return to the hospital to be reassessed for the need for inpatient admission to better control her pain.  I am very hesitant to escalate her pain regimen over the phone given her relative frailty and drowsy/slurred speech.  Unfortunately, she requires a repeat assessment in person.

## 2014-10-19 NOTE — ED Notes (Signed)
Admitting MD at bedside.

## 2014-10-19 NOTE — Telephone Encounter (Signed)
Call returned and pt advised to go to ED.  She voices understanding

## 2014-10-19 NOTE — ED Notes (Signed)
Patient transported to X-ray 

## 2014-10-20 ENCOUNTER — Encounter (HOSPITAL_COMMUNITY): Payer: Self-pay | Admitting: General Practice

## 2014-10-20 ENCOUNTER — Observation Stay (HOSPITAL_COMMUNITY): Payer: PPO

## 2014-10-20 DIAGNOSIS — R0781 Pleurodynia: Secondary | ICD-10-CM | POA: Diagnosis not present

## 2014-10-20 DIAGNOSIS — R7989 Other specified abnormal findings of blood chemistry: Secondary | ICD-10-CM | POA: Diagnosis not present

## 2014-10-20 DIAGNOSIS — W19XXXD Unspecified fall, subsequent encounter: Secondary | ICD-10-CM | POA: Diagnosis not present

## 2014-10-20 DIAGNOSIS — F05 Delirium due to known physiological condition: Secondary | ICD-10-CM | POA: Diagnosis not present

## 2014-10-20 DIAGNOSIS — S2241XD Multiple fractures of ribs, right side, subsequent encounter for fracture with routine healing: Secondary | ICD-10-CM | POA: Diagnosis not present

## 2014-10-20 DIAGNOSIS — Z79899 Other long term (current) drug therapy: Secondary | ICD-10-CM

## 2014-10-20 LAB — PROTIME-INR
INR: 2.15 — ABNORMAL HIGH (ref 0.00–1.49)
Prothrombin Time: 23.9 seconds — ABNORMAL HIGH (ref 11.6–15.2)

## 2014-10-20 LAB — CBC
HCT: 31.1 % — ABNORMAL LOW (ref 36.0–46.0)
Hemoglobin: 9.5 g/dL — ABNORMAL LOW (ref 12.0–15.0)
MCH: 27.4 pg (ref 26.0–34.0)
MCHC: 30.5 g/dL (ref 30.0–36.0)
MCV: 89.6 fL (ref 78.0–100.0)
Platelets: 255 10*3/uL (ref 150–400)
RBC: 3.47 MIL/uL — ABNORMAL LOW (ref 3.87–5.11)
RDW: 16.4 % — ABNORMAL HIGH (ref 11.5–15.5)
WBC: 10 10*3/uL (ref 4.0–10.5)

## 2014-10-20 LAB — GLUCOSE, CAPILLARY
Glucose-Capillary: 172 mg/dL — ABNORMAL HIGH (ref 65–99)
Glucose-Capillary: 142 mg/dL — ABNORMAL HIGH (ref 65–99)
Glucose-Capillary: 191 mg/dL — ABNORMAL HIGH (ref 65–99)
Glucose-Capillary: 177 mg/dL — ABNORMAL HIGH (ref 65–99)
Glucose-Capillary: 184 mg/dL — ABNORMAL HIGH (ref 65–99)

## 2014-10-20 LAB — BASIC METABOLIC PANEL
Anion gap: 11 (ref 5–15)
BUN: 15 mg/dL (ref 6–20)
CO2: 27 mmol/L (ref 22–32)
Calcium: 9.1 mg/dL (ref 8.9–10.3)
Chloride: 97 mmol/L — ABNORMAL LOW (ref 101–111)
Creatinine, Ser: 1.21 mg/dL — ABNORMAL HIGH (ref 0.44–1.00)
GFR calc Af Amer: 53 mL/min — ABNORMAL LOW (ref >60)
GFR calc non Af Amer: 46 mL/min — ABNORMAL LOW (ref >60)
Glucose, Bld: 172 mg/dL — ABNORMAL HIGH (ref 65–99)
Potassium: 3.9 mmol/L (ref 3.5–5.1)
Sodium: 135 mmol/L (ref 135–145)

## 2014-10-20 MED ORDER — ROSUVASTATIN CALCIUM 20 MG PO TABS
20.0000 mg | ORAL_TABLET | Freq: Every day | ORAL | Status: DC
  Administered 2014-10-20 – 2014-10-25 (×7): 20 mg via ORAL
  Filled 2014-10-20 (×7): qty 1

## 2014-10-20 MED ORDER — POLYETHYLENE GLYCOL 3350 17 G PO PACK: 17.0000 g | PACK | Freq: Every day | ORAL | Status: DC | PRN

## 2014-10-20 MED ORDER — MOMETASONE FURO-FORMOTEROL FUM 100-5 MCG/ACT IN AERO
2.0000 | INHALATION_SPRAY | Freq: Two times a day (BID) | RESPIRATORY_TRACT | Status: DC
  Administered 2014-10-20 – 2014-10-26 (×9): 2 via RESPIRATORY_TRACT
  Filled 2014-10-20 (×2): qty 8.8

## 2014-10-20 MED ORDER — WARFARIN - PHARMACIST DOSING INPATIENT
Freq: Every day | Status: DC
  Administered 2014-10-20: 18:00:00
  Administered 2014-10-21: 1

## 2014-10-20 MED ORDER — CYCLOBENZAPRINE HCL 5 MG PO TABS
5.0000 mg | ORAL_TABLET | Freq: Three times a day (TID) | ORAL | Status: DC | PRN
  Administered 2014-10-20: 5 mg via ORAL
  Filled 2014-10-20: qty 1

## 2014-10-20 MED ORDER — TIOTROPIUM BROMIDE MONOHYDRATE 18 MCG IN CAPS
18.0000 ug | ORAL_CAPSULE | Freq: Every day | RESPIRATORY_TRACT | Status: DC
  Administered 2014-10-21 – 2014-10-26 (×4): 18 ug via RESPIRATORY_TRACT
  Filled 2014-10-20 (×2): qty 5

## 2014-10-20 MED ORDER — NICOTINE 14 MG/24HR TD PT24
14.0000 mg | MEDICATED_PATCH | Freq: Every day | TRANSDERMAL | Status: DC
  Administered 2014-10-20 – 2014-10-25 (×6): 14 mg via TRANSDERMAL
  Filled 2014-10-20 (×7): qty 1

## 2014-10-20 MED ORDER — INSULIN ASPART 100 UNIT/ML ~~LOC~~ SOLN
0.0000 [IU] | Freq: Three times a day (TID) | SUBCUTANEOUS | Status: DC
  Administered 2014-10-20 (×2): 3 [IU] via SUBCUTANEOUS
  Administered 2014-10-20 – 2014-10-21 (×2): 2 [IU] via SUBCUTANEOUS
  Administered 2014-10-21: 3 [IU] via SUBCUTANEOUS
  Administered 2014-10-22 – 2014-10-23 (×2): 2 [IU] via SUBCUTANEOUS
  Administered 2014-10-24: 5 [IU] via SUBCUTANEOUS
  Administered 2014-10-24 – 2014-10-25 (×4): 2 [IU] via SUBCUTANEOUS
  Administered 2014-10-26: 3 [IU] via SUBCUTANEOUS

## 2014-10-20 MED ORDER — IPRATROPIUM-ALBUTEROL 0.5-2.5 (3) MG/3ML IN SOLN
3.0000 mL | Freq: Once | RESPIRATORY_TRACT | Status: AC
  Administered 2014-10-20: 3 mL via RESPIRATORY_TRACT
  Filled 2014-10-20: qty 3

## 2014-10-20 MED ORDER — ONDANSETRON HCL 4 MG/2ML IJ SOLN: 4.0000 mg | Freq: Four times a day (QID) | INTRAMUSCULAR | Status: DC | PRN

## 2014-10-20 MED ORDER — PANTOPRAZOLE SODIUM 40 MG PO TBEC
40.0000 mg | DELAYED_RELEASE_TABLET | Freq: Every day | ORAL | Status: DC
  Administered 2014-10-20 – 2014-10-26 (×7): 40 mg via ORAL
  Filled 2014-10-20 (×7): qty 1

## 2014-10-20 MED ORDER — CHLORHEXIDINE GLUCONATE 0.12 % MT SOLN
15.0000 mL | Freq: Two times a day (BID) | OROMUCOSAL | Status: DC
  Administered 2014-10-20 – 2014-10-25 (×12): 15 mL via OROMUCOSAL
  Filled 2014-10-20 (×11): qty 15

## 2014-10-20 MED ORDER — ALBUTEROL SULFATE (2.5 MG/3ML) 0.083% IN NEBU
3.0000 mL | INHALATION_SOLUTION | Freq: Four times a day (QID) | RESPIRATORY_TRACT | Status: DC | PRN
  Administered 2014-10-20: 3 mL via RESPIRATORY_TRACT
  Filled 2014-10-20: qty 3

## 2014-10-20 MED ORDER — NALOXONE HCL 0.4 MG/ML IJ SOLN
0.4000 mg | INTRAMUSCULAR | Status: DC | PRN
Start: 2014-10-20 — End: 2014-10-26
  Administered 2014-10-21: 0.4 mg via INTRAVENOUS
  Filled 2014-10-20: qty 1

## 2014-10-20 MED ORDER — POTASSIUM CHLORIDE CRYS ER 20 MEQ PO TBCR
20.0000 meq | EXTENDED_RELEASE_TABLET | Freq: Two times a day (BID) | ORAL | Status: DC
  Administered 2014-10-20 – 2014-10-26 (×12): 20 meq via ORAL
  Filled 2014-10-20 (×12): qty 1

## 2014-10-20 MED ORDER — GABAPENTIN 300 MG PO CAPS
300.0000 mg | ORAL_CAPSULE | Freq: Three times a day (TID) | ORAL | Status: DC
  Administered 2014-10-20 – 2014-10-26 (×19): 300 mg via ORAL
  Filled 2014-10-20 (×20): qty 1

## 2014-10-20 MED ORDER — ALPRAZOLAM 0.5 MG PO TABS
0.5000 mg | ORAL_TABLET | Freq: Every evening | ORAL | Status: DC | PRN
  Administered 2014-10-22 – 2014-10-25 (×4): 0.5 mg via ORAL
  Filled 2014-10-20 (×5): qty 1

## 2014-10-20 MED ORDER — ALBUTEROL SULFATE HFA 108 (90 BASE) MCG/ACT IN AERS: 2.0000 | INHALATION_SPRAY | Freq: Four times a day (QID) | RESPIRATORY_TRACT | Status: DC | PRN

## 2014-10-20 MED ORDER — HYDROCODONE-ACETAMINOPHEN 5-325 MG PO TABS
1.0000 | ORAL_TABLET | Freq: Four times a day (QID) | ORAL | Status: DC | PRN
  Administered 2014-10-20: 2 via ORAL
  Administered 2014-10-20: 1 via ORAL
  Filled 2014-10-20: qty 1
  Filled 2014-10-20: qty 2

## 2014-10-20 MED ORDER — INSULIN DETEMIR 100 UNIT/ML ~~LOC~~ SOLN
35.0000 [IU] | Freq: Two times a day (BID) | SUBCUTANEOUS | Status: DC
  Administered 2014-10-20 – 2014-10-26 (×13): 35 [IU] via SUBCUTANEOUS
  Filled 2014-10-20 (×15): qty 0.35

## 2014-10-20 MED ORDER — FUROSEMIDE 80 MG PO TABS
80.0000 mg | ORAL_TABLET | Freq: Two times a day (BID) | ORAL | Status: DC
  Administered 2014-10-20 – 2014-10-26 (×14): 80 mg via ORAL
  Filled 2014-10-20 (×14): qty 1

## 2014-10-20 MED ORDER — NAPROXEN 250 MG PO TABS
500.0000 mg | ORAL_TABLET | Freq: Two times a day (BID) | ORAL | Status: DC
  Administered 2014-10-20: 500 mg via ORAL
  Filled 2014-10-20: qty 2

## 2014-10-20 MED ORDER — BUPROPION HCL 100 MG PO TABS
100.0000 mg | ORAL_TABLET | Freq: Two times a day (BID) | ORAL | Status: DC
  Administered 2014-10-20 – 2014-10-26 (×13): 100 mg via ORAL
  Filled 2014-10-20 (×16): qty 1

## 2014-10-20 MED ORDER — BACLOFEN 10 MG PO TABS
10.0000 mg | ORAL_TABLET | Freq: Three times a day (TID) | ORAL | Status: DC
  Administered 2014-10-20 (×3): 10 mg via ORAL
  Filled 2014-10-20 (×4): qty 1

## 2014-10-20 MED ORDER — DICLOFENAC SODIUM 1 % TD GEL
2.0000 g | Freq: Four times a day (QID) | TRANSDERMAL | Status: DC
  Administered 2014-10-20 – 2014-10-25 (×22): 2 g via TOPICAL
  Filled 2014-10-20 (×2): qty 100

## 2014-10-20 MED ORDER — ONDANSETRON HCL 4 MG PO TABS: 4.0000 mg | ORAL_TABLET | Freq: Four times a day (QID) | ORAL | Status: DC | PRN

## 2014-10-20 MED ORDER — HYDROMORPHONE HCL 1 MG/ML IJ SOLN
0.5000 mg | INTRAMUSCULAR | Status: DC | PRN
  Administered 2014-10-20 – 2014-10-21 (×7): 0.5 mg via INTRAVENOUS
  Filled 2014-10-20 (×7): qty 1

## 2014-10-20 MED ORDER — SERTRALINE HCL 100 MG PO TABS
200.0000 mg | ORAL_TABLET | Freq: Every day | ORAL | Status: DC
  Administered 2014-10-20 – 2014-10-26 (×7): 200 mg via ORAL
  Filled 2014-10-20 (×7): qty 2

## 2014-10-20 MED ORDER — CETYLPYRIDINIUM CHLORIDE 0.05 % MT LIQD
7.0000 mL | Freq: Two times a day (BID) | OROMUCOSAL | Status: DC
  Administered 2014-10-20 – 2014-10-24 (×9): 7 mL via OROMUCOSAL

## 2014-10-20 MED ORDER — LIDOCAINE 5 % EX PTCH
1.0000 | MEDICATED_PATCH | CUTANEOUS | Status: DC
  Administered 2014-10-20 – 2014-10-25 (×6): 1 via TRANSDERMAL
  Filled 2014-10-20 (×7): qty 1

## 2014-10-20 MED ORDER — WARFARIN SODIUM 2.5 MG PO TABS
2.5000 mg | ORAL_TABLET | Freq: Once | ORAL | Status: AC
  Administered 2014-10-20: 2.5 mg via ORAL
  Filled 2014-10-20: qty 1

## 2014-10-20 MED ORDER — FLUTICASONE PROPIONATE 50 MCG/ACT NA SUSP
2.0000 | Freq: Every day | NASAL | Status: DC
  Administered 2014-10-20 – 2014-10-25 (×6): 2 via NASAL
  Filled 2014-10-20 (×2): qty 16

## 2014-10-20 NOTE — Progress Notes (Signed)
Pt arrived to unit alert and oriented x4. Oriented to room, unit, and staff.  Bed in lowest position and call bell is within reach. Will continue to monitor.

## 2014-10-20 NOTE — Progress Notes (Signed)
Subjective: Patient recently discharged and readmitted overnight for pain control.  States that she is still having right sided ribcage pain with muscle spasm.  Worsened with cough, deep inspiration, and movement.  Denies any shortness of breath, chest pain, urinary complaints.  Objective: Vital signs in last 24 hours: Filed Vitals:   10/20/14 0110 10/20/14 0141 10/20/14 0659 10/20/14 1349  BP: 140/47  160/49 98/55  Pulse: 63  79 74  Temp:   98.2 F (36.8 C) 98.4 F (36.9 C)  TempSrc:   Oral Oral  Resp: _0 Height:   _1  (1.575 m)   Weight:   184 lb 8.4 oz (83.7 kg)   SpO2: 97% 97% 94% 99%   Weight change:   Intake/Output Summary (Last 24 hours) at 10/20/14 1553 Last data filed at 10/20/14 0913  Gross per 24 hour  Intake    720 ml  Output      0 ml  Net    720 ml   General: sitting up in chair, mild distress seems to be exacerbated by spasm HEENT: EOMI, no scleral icterus Cardiac: RRR, no rubs, murmurs or gallops. Pulm: clear to auscultation bilaterally, diminished movement of air Abd: soft, nontender, nondistended, BS present Ext: warm and well perfused, no pedal edema MSK: Tender to palpation over right ribcage area and thoracic area on the right.  No spinal process tenderness Neuro: alert and oriented X3, cranial nerves II-XII grossly intact  Lab Results: Basic Metabolic Panel:  Recent Labs Lab 10/19/14 1749 10/20/14 0126  NA 138 135  K 3.7 3.9  CL 99* 97*  CO2 29 27  GLUCOSE 134* 172*  BUN 15 15  CREATININE 1.06* 1.21*  CALCIUM 9.5 9.1   CBC:  Recent Labs Lab 10/13/14 1959  10/19/14 1749 10/20/14 0126  WBC 9.7  < > 10.0 10.0  NEUTROABS 7.7  --  6.8  --   HGB 10.3*  < > 10.5* 9.5*  HCT 34.0*  < > 34.7* 31.1*  MCV 91.6  < > 90.1 89.6  PLT 247  < > 294 255  < > = values in this interval not displayed. CBG:  Recent Labs Lab 10/15/14 2149 10/16/14 0647 10/16/14 1138 10/20/14 0200 10/20/14 0752 10/20/14 1147  GLUCAP 267* 188* 164*  172* 142* 191*   Coagulation:  Recent Labs Lab 10/13/14 1959 10/15/14 0743 10/16/14 0450 10/20/14 0126  LABPROT 24.1* 21.0* 20.1* 23.9*  INR 2.19* 1.82* 1.71* 2.15*   Urine Drug Screen: Drugs of Abuse     Component Value Date/Time   LABOPIA POSITIVE* 01/07/2013 2114   COCAINSCRNUR NONE DETECTED 01/07/2013 2114   LABBENZ NONE DETECTED 01/07/2013 2114   AMPHETMU NONE DETECTED 01/07/2013 2114   THCU NONE DETECTED 01/07/2013 2114   LABBARB NONE DETECTED 01/07/2013 2114    Urinalysis:  Recent Labs Lab 10/13/14 1958  COLORURINE STRAW*  LABSPEC 1.008  PHURINE 5.5  GLUCOSEU NEGATIVE  HGBUR NEGATIVE  BILIRUBINUR NEGATIVE  KETONESUR NEGATIVE  PROTEINUR NEGATIVE  UROBILINOGEN 0.2  NITRITE NEGATIVE  LEUKOCYTESUR NEGATIVE    Micro Results: No results found for this or any previous visit (from the past 240 hour(s)). Studies/Results: Dg Chest 2 View  10/19/2014   CLINICAL DATA:  Right flank pain for 3 weeks. Eighth rib fracture diagnosed on x-ray 10/13/2014. History of diabetes and smoking. History of AFib and MVR.  EXAM: CHEST  2 VIEW  COMPARISON:  Chest CT and chest x-ray on 10/13/2014  FINDINGS: Heart is mildly enlarged.  Lungs are clear. Patient has had previous median sternotomy and right clavicle ORIF. Deformity of the right posterior lateral sixth, seventh, and eighth ribs consistent with subacute fractures. No pneumothorax.  IMPRESSION: Cardiomegaly.  Subacute right rib fractures.   Electronically Signed   By: Nolon Nations M.D.   On: 10/19/2014 18:22   Dg Thoracic Spine 4v  10/20/2014   CLINICAL DATA:  Back pain after fall 4 weeks ago  EXAM: THORACIC SPINE - 4+ VIEW  COMPARISON:  None.  FINDINGS: Calcification of the aorta. Normal alignment. Diffuse osteopenia. Mild degenerative disc disease at every level through the thoracic spine. No fracture.  IMPRESSION: No acute findings   Electronically Signed   By: Skipper Cliche M.D.   On: 10/20/2014 13:27    Medications: Scheduled Meds: . antiseptic oral rinse  7 mL Mouth Rinse q12n4p  . baclofen  10 mg Oral TID  . buPROPion  100 mg Oral BID  . chlorhexidine  15 mL Mouth Rinse BID  . diclofenac sodium  2 g Topical QID  . fluticasone  2 spray Each Nare Daily  . furosemide  80 mg Oral BID  . gabapentin  300 mg Oral TID  . insulin aspart  0-15 Units Subcutaneous TID WC  . insulin detemir  35 Units Subcutaneous BID  . lidocaine  1 patch Transdermal Q24H  . mometasone-formoterol  2 puff Inhalation BID  . nicotine  14 mg Transdermal Daily  . pantoprazole  40 mg Oral Daily  . rosuvastatin  20 mg Oral QHS  . sertraline  200 mg Oral Daily  . tiotropium  18 mcg Inhalation Daily  . warfarin  2.5 mg Oral ONCE-1800  . Warfarin - Pharmacist Dosing Inpatient   Does not apply q1800   Continuous Infusions:  PRN Meds:.albuterol, HYDROmorphone (DILAUDID) injection, naLOXone (NARCAN)  injection, ondansetron **OR** ondansetron (ZOFRAN) IV, polyethylene glycol Assessment/Plan: Principal Problem:   Intractable pain Active Problems:   Gastroesophageal reflux disease   Type 2 diabetes mellitus with diabetic neuropathy   Pulmonary emphysema   Osteoporosis   Paroxysmal atrial fibrillation   Tobacco abuse   Rib fractures 64 yo female with CHF, paroxysmal AFib, moderate to severe pulmonary HTN, COPD, and osteoporosis, presenting with 2 week h/o right sided flank and back pain.  Acute Right 6th-8th Displaced Rib Fractures: history of fall from recliner 4 weeks ago, with development of pain only 2 weeks ago.  History of prior fracture secondary to osteoporosis.  Readmitted for uncontrolled pain after failing outpatient treatment with Dilaudid 781m PO q4h and Norco 5-325 1-2 tabs q6h and Flexeril 569mTID.  She reportedly was taking both Dilaudid and Norco although had been instructed at discharge to only take one of these and not both. -continue Dilaudid 0.81m74mV q3h prn.  Will attempt to transition to PO if  pain allows -Naloxone if needed -continue lidocaine patch -stop Norco -stop Naproxen -stop Flexeril -start Voltaren gel -start Baclofen 43m76m tid -incentive spirometry -Tspine xray negative -PT/OT to evaluate and treat.  They recommend home health PT at d/c  Paroxysmal Atrial Fibrillation: On Coumadin 2.5 mg QD, but 5 mg on Monday. INR 2.15 on admission. No evidence of bleeding. -Warfarin per pharmacy   COPD/Tobacco Abuse: Patient is on Albuterol, Advair and Spriva at home. Patient counseled that current smoking will delay fracture healing. - Supplemental O2 as needed - Albuterol prn - Dulera bid - Spiriva daily - Nicotine Patch  T2DM: patient is on Levemir 45 units BID and Novolog 18  units with meals. - SSI-M - Levemir 35 units BID - Carb mod diet - Gabapentin 300 mg TID  Chronic dCHF: Not clinically in CHF exacerbation. Patient is on Lasix 80 mg BID and Kdur 20 mEq BID at home. - Continue Lasix and Kdur  Depression: Patient is on Xanax 0.5-1 mg BID prn, wellbutrin 100 mg BID and zoloft 200 mg daily. - Continue Wellbutrin and Zoloft - Holding Xanax  GERD: On omeprazole 40 mg daily at home. - Protonix 40 mg daily  HLD: On rosuvastatin 20 mg daily at home. - Continue rosuvastatin   Dispo: Disposition is deferred at this time, awaiting improvement of current medical problems.     The patient does have a current PCP [name], MD) and does need an [facility] hospital follow-up appointment after discharge.  The patient does not have transportation limitations that hinder transportation to clinic appointments.     Jule Ser, DO 10/20/2014, 3:53 PM

## 2014-10-20 NOTE — Care Management Note (Signed)
Case Management Note  Patient Details  Name: ROENA SASSAMAN MRN: [id number] Date of Birth: 08-Apr-1950  Subjective/Objective:                Patient from home, granddaughter is living with her and patient states that she is helpful. Patient has A fib and is on coumadin.  Patient fell two weeks ago and broke a few ribs, was sent home and returned with pain. Patient is oxygen dependent at home to 2L, and gets O2 through [facility]. She has a walker a cane at home, denies having a nebulizer. Patient denies any assistance with paying for medications stating she has a catastrophic plan and has no co-pays. No needs identified at this time, will continue to follow.   Action/Plan:   Expected Discharge Date:                  Expected Discharge Plan:  Home/Self Care  In-House Referral:     Discharge planning Services  CM Consult  Post Acute Care Choice:    Choice offered to:     DME Arranged:    DME Agency:     HH Arranged:    HH Agency:     Status of Service:  In process, will continue to follow  Medicare Important Message Given:    Date Medicare IM Given:    Medicare IM give by:    Date Additional Medicare IM Given:    Additional Medicare Important Message give by:     If discussed at [place] of Stay Meetings, dates discussed:    Additional Comments:  Carles Collet, RN 10/20/2014, 11:49 AM

## 2014-10-20 NOTE — Progress Notes (Signed)
Received report from [place], [place] in ED for transfer of pt to [phone].

## 2014-10-20 NOTE — Progress Notes (Signed)
ANTICOAGULATION CONSULT NOTE - Initial Consult  Pharmacy Consult for Coumadin Indication: atrial fibrillation  Allergies  Allergen Reactions  . Lorazepam     Patient's sister noted that ativan caused the patient to become extremely confused during hospitalization 09/2010  . Morphine And Related Other (See Comments)  . Oxycontin [Oxycodone] Other (See Comments)    headache  . Tramadol Hcl     REACTION: swelling    Patient Measurements: Height: _0  (157.5 cm) Weight: 184 lb 8.4 oz (83.7 kg) IBW/kg (Calculated) : 50.1  Vital Signs: Temp: 98.2 F (36.8 C) (09/09 0659) Temp Source: Oral (09/09 0659) BP: 160/49 mmHg (09/09 0659) Pulse Rate: 79 (09/09 0659)  Labs:  Recent Labs  10/19/14 1749 10/20/14 0126  HGB 10.5* 9.5*  HCT 34.7* 31.1*  PLT 294 255  LABPROT  --  23.9*  INR  --  2.15*  CREATININE 1.06* 1.21*    Estimated Creatinine Clearance: 46.5 mL/min (by C-G formula based on Cr of 1.21).  Assessment: 64 yo female admitted for 2 week hx of r-sided flank and back pain. Recent rip fractures. Rib fx in 6/7/8  PMH: CHF, AFib, Pulm HTN, COPD, osteoporosis  AC: on warfarin PTA for a fib. Last dose 9/7  PTA home dose is 2.5 mg daily, except 5 mg on Mon  INR 2.15  Renal: SCr 1.21  Heme: H&H 9.5/31.1, Plt 255  Goal of Therapy:  INR 2-3   Plan:  Warfarin 2.5 mg x 1 Daily PT INR, CBC q72h Monitor for S/sx of bleeding Educate patient  Levester Fresh, PharmD, BCPS Clinical Pharmacist Pager [phone] 10/20/2014 8:42 AM

## 2014-10-20 NOTE — Evaluation (Signed)
Physical Therapy Evaluation Patient Details Name: [name] MRN: [id number] DOB: 26-Sep-1950 Today's Date: 10/20/2014   History of Present Illness  64 y.o. female with a PMH of COPD, pulmonary HTN, tobacco abuse, CHF, osteoporosis who presents to the ED with right sided rib pain and back pain. She was recently admitted for right 6th and 8th displaced rib fractures; she was discharged 09/05. Reports worsening pain since the time of her discharge, and states the pain medication she was given has not been effective in controlling her pain. States her symptoms are constant. Movement exacerbates her pain; her prescribed pain medication has given her minimal relief. She denies fever, chills, lightheadedness, syncope. Reports intermittent dizziness. Denies chest pain, shortness of breath, abdominal pain, N/V/D/C, dysuria, urgency, frequency. Reports cough, unchanged from baseline.     Clinical Impression  Pt admitted with above diagnosis and below listed impairments (see PT Problem List). Pt will benefit from acute skilled PT intervention to address these impairments to prepare for safe d/c home and increase functional independence. Planning to go home with granddaughter who can provide support as needed. Pt currently at steady A level without AD for basic transfers but unable to full assess gait or higher level balance due to pt with increased pain today. Continue to follow acutely.    Follow Up Recommendations Home health PT;Supervision for mobility/OOB    Equipment Recommendations  None recommended by PT    Recommendations for Other Services       Precautions / Restrictions Restrictions Weight Bearing Restrictions: No      Mobility  Bed Mobility Overal bed mobility: Needs Assistance Bed Mobility: Supine to Sit     Supine to sit: Min assist;HOB elevated     General bed mobility comments: Cues for technique - requires support for trunk due to rib pain  Transfers Overall transfer  level: Needs assistance Equipment used: None Transfers: Sit to/from [facility] Sit to Stand: Min guard Stand pivot transfers: Min guard       General transfer comment: Extra time for all mobility attempts. Min guard for balance and safety - upright posture limited due to pain.   Ambulation/Gait             General Gait Details: Pt deferred gait at this time due to pain and effects of medication. Pt able to take a few steps with min guard from bed to recliner  Stairs            Wheelchair Mobility    Modified Rankin (Stroke Patients Only)       Balance Overall balance assessment: Needs assistance;History of Falls Sitting-balance support: Feet supported;No upper extremity supported Sitting balance-Leahy Scale: Good Sitting balance - Comments: when pt with coughing fit, flexes far forward but able to maitnain balance without support     Standing balance-Leahy Scale: Fair                               Pertinent Vitals/Pain Pain Assessment: 0-10 Pain Score: 10-Worst pain ever Pain Location: ribs Pain Descriptors / Indicators: Aching;Spasm;Shooting Pain Intervention(s): Monitored during session;Limited activity within patient's tolerance;Patient requesting pain meds-RN notified    Home Living Family/patient expects to be discharged to:: Private residence Living Arrangements: Alone Available Help at Discharge: Family;Available 24 hours/day (reports her granddaughter will be staying with her) Type of Home: House Home Access: Stairs to enter Entrance Stairs-Rails: None Entrance Stairs-Number of Steps: 1 Home Layout: One level  Home Equipment: [place] - 2 wheels;Cane - single point;Bedside commode      Prior Function Level of Independence: Independent         Comments: Reports her granddaughter would help her out of bed since rib fx ocurred     Hand Dominance        Extremity/Trunk Assessment   Upper Extremity Assessment:  Overall WFL for tasks assessed           Lower Extremity Assessment: Generalized weakness      Cervical / Trunk Assessment: Normal  Communication   Communication: No difficulties  Cognition Arousal/Alertness: Awake/alert;Suspect due to medications (period of closing eyes due medication per report) Behavior During Therapy: WFL for tasks assessed/performed Overall Cognitive Status: Within Functional Limits for tasks assessed       Memory:  (reports difficulty with time line leading up to admission)              General Comments General comments (skin integrity, edema, etc.): Pt using 2L O2 - used this prior to admission also    Exercises General Exercises - Lower Extremity Ankle Circles/Pumps: Strengthening;AROM;Both;5 reps;Seated Long Arc Quad: AROM;Strengthening;Both;5 reps      Assessment/Plan    PT Assessment Patient needs continued PT services  PT Diagnosis Difficulty walking;Generalized weakness;Acute pain   PT Problem List Decreased strength;Decreased activity tolerance;Decreased balance;Decreased mobility;Cardiopulmonary status limiting activity;Pain  PT Treatment Interventions DME instruction;Gait training;Stair training;Functional mobility training;Therapeutic activities;Therapeutic exercise;Balance training;Neuromuscular re-education;Patient/family education   PT Goals (Current goals can be found in the Care Plan section) Acute Rehab PT Goals Patient Stated Goal: stop hurting so much PT Goal Formulation: With patient Time For Goal Achievement: 11/03/14 Potential to Achieve Goals: Good    Frequency Min 3X/week   Barriers to discharge   pt reports grandaughter will be staying with pt upon d/c    Co-evaluation               End of Session Equipment Utilized During Treatment: Oxygen Activity Tolerance: Patient limited by pain Patient left: in chair;with call bell/phone within reach;with nursing/sitter in room Nurse Communication: Mobility  status;Patient requests pain meds         Time: 9718-2099 PT Time Calculation (min) (ACUTE ONLY): 22 min   Charges:   PT Evaluation $Initial PT Evaluation Tier I: 1 Procedure PT Treatments $Therapeutic Activity: 8-22 mins   PT G Codes:        Canary Brim Ivory Broad, PT, DPT Pager #: [phone]  10/20/2014, 2:49 PM

## 2014-10-20 NOTE — Progress Notes (Signed)
ANTICOAGULATION CONSULT NOTE - Initial Consult  Pharmacy Consult for Coumadin Indication: atrial fibrillation  Allergies  Allergen Reactions  . Lorazepam     Patient's sister noted that ativan caused the patient to become extremely confused during hospitalization 09/2010  . Morphine And Related Other (See Comments)  . Oxycontin [Oxycodone] Other (See Comments)    headache  . Tramadol Hcl     REACTION: swelling    Patient Measurements: Height: 5' 2" (157.5 cm) Weight: 192 lb (87.091 kg) IBW/kg (Calculated) : 50.1  Vital Signs: Temp: 98.1 F (36.7 C) (09/09 0109) Temp Source: Oral (09/09 0109) BP: 140/47 mmHg (09/09 0110) Pulse Rate: 63 (09/09 0110)  Labs:  Recent Labs  10/19/14 1749  HGB 10.5*  HCT 34.7*  PLT 294  CREATININE 1.06*    Estimated Creatinine Clearance: 54.2 mL/min (by C-G formula based on Cr of 1.06).   Medical History: Past Medical History  Diagnosis Date  . COPD (chronic obstructive pulmonary disease) with emphysema     PFTs 02/2012: FEV1 0.92 (40%), ratio 69, 27% increase in FEV1 with BD, TLC 91%, severe airtrapping, DLCO49% On chronic home O2. Pulmonary rehab referral 05/2012   . Moderate to severe pulmonary hypertension     2014 TEE w PA peak pressure 46 mmHg, s/p MV replacement   . Tobacco abuse 07/28/2012  . Hyperlipidemia LDL goal < 100 11/20/2005  . Carotid artery stenosis     s/p right endarterectomy (06/2010) Carotid US (07/2010):  Left: Moderate-to-severe (60-79%) calcific and non-calcific plaque origin and proximal ICA and ECA   . Mitral stenosis     s/p Mitral valve replacement with a 27-mm pericardial porcine valve (Medtronic Mosaic valve, serial #[id number] on 09/20/10, Dr. Prescott Gum)   . Chronic congestive heart failure with left ventricular diastolic dysfunction 7/51/0258  . Depression 11/19/2005  . Anxiety 07/24/2010  . Fibromyalgia 08/29/2010  . Obesity (BMI 30.0-34.9) 10/23/2011  . Adenomatous polyps 05/14/2011    Colonoscopy  (05/2011): 4 mm adenomatous polyp excised endoscopically Colonoscopy (02/2002): Adenomatous polyp excised endoscopically   . Gastroesophageal reflux disease   . Internal hemorrhoids 08/04/2012  . Osteoporosis     DEXA (12/09/2011): L-spine T -3.7, left hip T -1.4 DEXA (12/2004): L-spine T -2.6, left hip -0.1   . Allergic rhinitis 06/01/2012  . Bilateral cataracts 08/04/2012    Visually insignificant   . Chronic constipation 02/03/2011  . Chronic venous insufficiency 08/04/2012  . Paroxysmal atrial fibrillation 10/22/2010    s/p Left atrial maze procedure for paroxysmal atrial fibrillation on 09/20/2010 by Dr Prescott Gum.  Subsequent splenic infarct, decision was made to re-anticoagulate with coumadin, likely life-long as this is the most likely cause of the splenic infarct.   . Chronic low back pain 10/06/2012  . Anemia of chronic disease 01/01/2013  . Obstructive sleep apnea     Nocturnal polysomnography (06/2009): Moderate sleep apnea/ hypopnea syndrome , AHI 17.8 per hour with nonpositional hypopneas. CPAP titration to 12 CWP, AHI 2.4 per hour. On nocturnal CPAP via a small resMed Quattro full-face mask with heated humidifier.   . Type 2 diabetes mellitus with diabetic neuropathy   . History of blood transfusion     "several times"   . Clear cell renal cell carcinoma 07/21/2011    s/p cryoablation of left RCC in 09/2011 by [name]. Followed by [name]  [facility]) .    Marland Kitchen Chronic daily headache 01/16/2014  . Right nephrolithiasis 09/06/2014    5 mm non-obstructing calculus seen on CT scan  09/05/2014   . Aortic atherosclerosis 10/19/2014    Seen on CT scan, currently asymptomatic    Medications:  Prescriptions prior to admission  Medication Sig Dispense Refill Last Dose  . albuterol (PROAIR HFA) 108 (90 BASE) MCG/ACT inhaler Inhale 2 puffs into the lungs every 6 (six) hours as needed for wheezing or shortness of breath. 1 Inhaler 11 10/19/2014 at Unknown time  . ALPRAZolam (XANAX) 1  MG tablet Take 0.5-1 tablets (0.5-1 mg total) by mouth 2 (two) times daily as needed for anxiety. 60 tablet 5 10/19/2014 at Unknown time  . buPROPion (WELLBUTRIN) 100 MG tablet Take 1 tablet (100 mg total) by mouth 2 (two) times daily. 180 tablet 3 10/19/2014 at Unknown time  . Calcium Carb-Cholecalciferol 600-800 MG-UNIT TABS Take 1 tablet by mouth 2 (two) times daily.    10/19/2014 at Unknown time  . chlorpheniramine (CHLOR-TRIMETON) 4 MG tablet Take 1 tablet (4 mg total) by mouth 3 (three) times daily as needed for allergies. 90 tablet 11 Past Week at Unknown time  . cyclobenzaprine (FLEXERIL) 5 MG tablet Take 1 tablet (5 mg total) by mouth 3 (three) times daily as needed for muscle spasms. 90 tablet 0 10/19/2014 at Unknown time  . fluticasone (FLONASE) 50 MCG/ACT nasal spray Place 2 sprays into both nostrils daily. 16 g 3 10/19/2014 at Unknown time  . Fluticasone-Salmeterol (ADVAIR DISKUS) 250-50 MCG/DOSE AEPB Inhale 1 puff into the lungs 2 (two) times daily. 180 each 3 10/19/2014 at Unknown time  . furosemide (LASIX) 80 MG tablet Take 1 tablet (80 mg total) by mouth 2 (two) times daily. 180 tablet 3 10/19/2014 at Unknown time  . gabapentin (NEURONTIN) 300 MG capsule Take 1 capsule (300 mg total) by mouth 3 (three) times daily. 270 capsule 3 10/19/2014 at Unknown time  . HYDROcodone-acetaminophen (NORCO/VICODIN) 5-325 MG per tablet Take 1-2 tablets by mouth every 6 (six) hours as needed for severe pain. 180 tablet 0 10/19/2014 at Unknown time  . HYDROmorphone (DILAUDID) 2 MG tablet Take 0.5 tablets (1 mg total) by mouth every 4 (four) hours as needed for severe pain. 15 tablet 0 10/19/2014 at Unknown time  . ibuprofen (ADVIL,MOTRIN) 200 MG tablet Take 400 mg by mouth every 6 (six) hours as needed for moderate pain.   10/19/2014 at Unknown time  . insulin aspart (NOVOLOG) cartridge Inject 18 Units into the skin 3 (three) times daily with meals. (Patient taking differently: Inject 18 Units into the skin 3 (three) times  daily with meals. 18 units plus sliding scale) 30 mL 11 10/19/2014 at Unknown time  . Insulin Detemir (LEVEMIR) 100 UNIT/ML Pen Inject 45 Units into the skin 2 (two) times daily. 15 mL 11 10/19/2014 at Unknown time  . lidocaine (XYLOCAINE) 2 % jelly APPLY TO AFFECTED AREA THREE TIMES A DAY 30 mL 11 10/19/2014 at Unknown time  . nystatin cream (MYCOSTATIN) Apply 1 application topically 2 (two) times daily. 30 g 11 10/19/2014 at Unknown time  . omeprazole (PRILOSEC) 40 MG capsule Take 1 capsule (40 mg total) by mouth 2 (two) times daily. 30 capsule 1 10/19/2014 at Unknown time  . potassium chloride SA (K-DUR,KLOR-CON) 20 MEQ tablet Take 1 tablet (20 mEq total) by mouth 2 (two) times daily. 180 tablet 3 10/19/2014 at Unknown time  . rosuvastatin (CRESTOR) 20 MG tablet Take 1 tablet (20 mg total) by mouth at bedtime. 90 tablet 3 10/18/2014 at Unknown time  . sertraline (ZOLOFT) 100 MG tablet Take 2 tablets (200 mg total) by   mouth daily. 180 tablet 3 10/19/2014 at Unknown time  . tiotropium (SPIRIVA HANDIHALER) 18 MCG inhalation capsule Place 1 capsule (18 mcg total) into inhaler and inhale daily. 90 capsule 3 10/19/2014 at Unknown time  . warfarin (COUMADIN) 5 MG tablet Take 1/2 tablet (2.5 mg) every day except on Mondays take 1 tablet (5 mg) (Patient taking differently: Take 2.5-5 mg by mouth at bedtime. Take 1/2 tablet (2.5 mg) every day except on Mondays take 1 tablet (5 mg)) 40 tablet 0 10/18/2014 at Unknown time  . Blood Glucose Monitoring Suppl (ONETOUCH VERIO) W/DEVICE KIT 1 each by Does not apply route 4 (four) times daily. 1 kit 0 Taking  . EASY TOUCH PEN NEEDLES 31G X 5 MM MISC USE TO INJECT INSULIN 5 TIMES DAILY 200 each 8 Taking  . Lancets Misc. (ACCU-CHEK FASTCLIX LANCET) KIT Check your blood 4 times a day dx code 250.00 insulin requiring 1 kit 2 Taking  . ONETOUCH VERIO test strip Check blood sugar up to 4 times a day 360 each 3 Taking   Scheduled:  . antiseptic oral rinse  7 mL Mouth Rinse q12n4p  .  buPROPion  100 mg Oral BID  . chlorhexidine  15 mL Mouth Rinse BID  . fluticasone  2 spray Each Nare Daily  . furosemide  80 mg Oral BID  . gabapentin  300 mg Oral TID  . insulin aspart  0-15 Units Subcutaneous TID WC  . insulin detemir  35 Units Subcutaneous BID  . lidocaine  1 patch Transdermal Q24H  . mometasone-formoterol  2 puff Inhalation BID  . naproxen  500 mg Oral BID WC  . nicotine  14 mg Transdermal Daily  . pantoprazole  40 mg Oral Daily  . rosuvastatin  20 mg Oral QHS  . sertraline  200 mg Oral Daily  . tiotropium  18 mcg Inhalation Daily     Assessment: 64yo female c/o cont'd rib pain after discharge s/p stay for rib fractures, to continue Coumadin for Afib; last dose taken 9/7.  Goal of Therapy:  INR 2-3   Plan:  Will obtain current INR prior to dosing Coumadin.  Wynona Neat, PharmD, BCPS  10/20/2014,1:56 AM

## 2014-10-20 NOTE — Discharge Instructions (Addendum)
You have a follow up appointment scheduled at [facility] with Dr. Marvel Plan.  You have a prescription for an diclofenac gel to help with your pain that we have been giving you in the hospital.  Please apply to your area of pain as needed 4 times per day.  You may also use ibuprofen 400-666m every 6 hours as needed for a short period of time to help with pain.  You can resume your home dose of Vicodin as needed, too.   Information on my medicine - Coumadin   (Warfarin)  This medication education was reviewed with me or my healthcare representative as part of my discharge preparation.    Why was Coumadin prescribed for you? Coumadin was prescribed for you because you have a blood clot or a medical condition that can cause an increased risk of forming blood clots. Blood clots can cause serious health problems by blocking the flow of blood to the heart, lung, or brain. Coumadin can prevent harmful blood clots from forming. As a reminder your indication for Coumadin is:   Stroke Prevention Because Of Atrial Fibrillation  What test will check on my response to Coumadin? While on Coumadin (warfarin) you will need to have an INR test regularly to ensure that your dose is keeping you in the desired range. The INR (international normalized ratio) number is calculated from the result of the laboratory test called prothrombin time (PT).  If an INR APPOINTMENT HAS NOT ALREADY BEEN MADE FOR YOU please schedule an appointment to have this lab work done by your health care provider within 7 days. Your INR goal is usually a number between:  2 to 3 or your provider may give you a more narrow range like 2-2.5.  Ask your health care provider during an office visit what your goal INR is.  What  do you need to  know  About  COUMADIN? Take Coumadin (warfarin) exactly as prescribed by your healthcare provider about the same time each day.  DO NOT stop taking without talking to the doctor who  prescribed the medication.  Stopping without other blood clot prevention medication to take the place of Coumadin may increase your risk of developing a new clot or stroke.  Get refills before you run out.  What do you do if you miss a dose? If you miss a dose, take it as soon as you remember on the same day then continue your regularly scheduled regimen the next day.  Do not take two doses of Coumadin at the same time.  Important Safety Information A possible side effect of Coumadin (Warfarin) is an increased risk of bleeding. You should call your healthcare provider right away if you experience any of the following: ? Bleeding from an injury or your nose that does not stop. ? Unusual colored urine (red or dark brown) or unusual colored stools (red or black). ? Unusual bruising for unknown reasons. ? A serious fall or if you hit your head (even if there is no bleeding).  Some foods or medicines interact with Coumadin (warfarin) and might alter your response to warfarin. To help avoid this: ? Eat a balanced diet, maintaining a consistent amount of Vitamin K. ? Notify your provider about major diet changes you plan to make. ? Avoid alcohol or limit your intake to 1 drink for women and 2 drinks for men per day. (1 drink is 5 oz. wine, 12 oz. beer, or 1.5 oz. liquor.)  Make sure that ANY  health care provider who prescribes medication for you knows that you are taking Coumadin (warfarin).  Also make sure the healthcare provider who is monitoring your Coumadin knows when you have started a new medication including herbals and non-prescription products.  Coumadin (Warfarin)  Major Drug Interactions  Increased Warfarin Effect Decreased Warfarin Effect  Alcohol (large quantities) Antibiotics (esp. Septra/Bactrim, Flagyl, Cipro) Amiodarone (Cordarone) Aspirin (ASA) Cimetidine (Tagamet) Megestrol (Megace) NSAIDs (ibuprofen, naproxen, etc.) Piroxicam (Feldene) Propafenone (Rythmol  SR) Propranolol (Inderal) Isoniazid (INH) Posaconazole (Noxafil) Barbiturates (Phenobarbital) Carbamazepine (Tegretol) Chlordiazepoxide (Librium) Cholestyramine (Questran) Griseofulvin Oral Contraceptives Rifampin Sucralfate (Carafate) Vitamin K   Coumadin (Warfarin) Major Herbal Interactions  Increased Warfarin Effect Decreased Warfarin Effect  Garlic Ginseng Ginkgo biloba Coenzyme Q10 Green tea St. Johns wort    Coumadin (Warfarin) FOOD Interactions  Eat a consistent number of servings per week of foods HIGH in Vitamin K (1 serving =  cup)  Collards (cooked, or boiled & drained) Kale (cooked, or boiled & drained) Mustard greens (cooked, or boiled & drained) Parsley *serving size only =  cup Spinach (cooked, or boiled & drained) Swiss chard (cooked, or boiled & drained) Turnip greens (cooked, or boiled & drained)  Eat a consistent number of servings per week of foods MEDIUM-HIGH in Vitamin K (1 serving = 1 cup)  Asparagus (cooked, or boiled & drained) Broccoli (cooked, boiled & drained, or raw & chopped) Brussel sprouts (cooked, or boiled & drained) *serving size only =  cup Lettuce, raw (green leaf, endive, romaine) Spinach, raw Turnip greens, raw & chopped   These websites have more information on Coumadin (warfarin):  FailFactory.se; VeganReport.com.au;

## 2014-10-21 DIAGNOSIS — Z9981 Dependence on supplemental oxygen: Secondary | ICD-10-CM | POA: Diagnosis not present

## 2014-10-21 DIAGNOSIS — K219 Gastro-esophageal reflux disease without esophagitis: Secondary | ICD-10-CM | POA: Diagnosis present

## 2014-10-21 DIAGNOSIS — E785 Hyperlipidemia, unspecified: Secondary | ICD-10-CM | POA: Diagnosis present

## 2014-10-21 DIAGNOSIS — M8000XA Age-related osteoporosis with current pathological fracture, unspecified site, initial encounter for fracture: Secondary | ICD-10-CM | POA: Diagnosis present

## 2014-10-21 DIAGNOSIS — M62838 Other muscle spasm: Secondary | ICD-10-CM | POA: Diagnosis present

## 2014-10-21 DIAGNOSIS — S2241XD Multiple fractures of ribs, right side, subsequent encounter for fracture with routine healing: Secondary | ICD-10-CM | POA: Diagnosis not present

## 2014-10-21 DIAGNOSIS — Z952 Presence of prosthetic heart valve: Secondary | ICD-10-CM | POA: Diagnosis not present

## 2014-10-21 DIAGNOSIS — Z9181 History of falling: Secondary | ICD-10-CM | POA: Diagnosis not present

## 2014-10-21 DIAGNOSIS — S2241XA Multiple fractures of ribs, right side, initial encounter for closed fracture: Secondary | ICD-10-CM | POA: Diagnosis present

## 2014-10-21 DIAGNOSIS — M797 Fibromyalgia: Secondary | ICD-10-CM | POA: Diagnosis present

## 2014-10-21 DIAGNOSIS — Z79891 Long term (current) use of opiate analgesic: Secondary | ICD-10-CM | POA: Diagnosis not present

## 2014-10-21 DIAGNOSIS — R0781 Pleurodynia: Secondary | ICD-10-CM | POA: Insufficient documentation

## 2014-10-21 DIAGNOSIS — Z794 Long term (current) use of insulin: Secondary | ICD-10-CM | POA: Diagnosis not present

## 2014-10-21 DIAGNOSIS — F329 Major depressive disorder, single episode, unspecified: Secondary | ICD-10-CM | POA: Diagnosis present

## 2014-10-21 DIAGNOSIS — M545 Low back pain: Secondary | ICD-10-CM | POA: Diagnosis present

## 2014-10-21 DIAGNOSIS — E1140 Type 2 diabetes mellitus with diabetic neuropathy, unspecified: Secondary | ICD-10-CM | POA: Diagnosis present

## 2014-10-21 DIAGNOSIS — G4733 Obstructive sleep apnea (adult) (pediatric): Secondary | ICD-10-CM | POA: Diagnosis present

## 2014-10-21 DIAGNOSIS — J449 Chronic obstructive pulmonary disease, unspecified: Secondary | ICD-10-CM | POA: Diagnosis present

## 2014-10-21 DIAGNOSIS — Z7901 Long term (current) use of anticoagulants: Secondary | ICD-10-CM | POA: Diagnosis not present

## 2014-10-21 DIAGNOSIS — W19XXXD Unspecified fall, subsequent encounter: Secondary | ICD-10-CM | POA: Diagnosis not present

## 2014-10-21 DIAGNOSIS — G8929 Other chronic pain: Secondary | ICD-10-CM | POA: Diagnosis present

## 2014-10-21 DIAGNOSIS — I5032 Chronic diastolic (congestive) heart failure: Secondary | ICD-10-CM | POA: Diagnosis present

## 2014-10-21 DIAGNOSIS — F05 Delirium due to known physiological condition: Secondary | ICD-10-CM | POA: Diagnosis not present

## 2014-10-21 DIAGNOSIS — Z79899 Other long term (current) drug therapy: Secondary | ICD-10-CM | POA: Diagnosis not present

## 2014-10-21 DIAGNOSIS — Z85528 Personal history of other malignant neoplasm of kidney: Secondary | ICD-10-CM | POA: Diagnosis not present

## 2014-10-21 DIAGNOSIS — I272 Other secondary pulmonary hypertension: Secondary | ICD-10-CM | POA: Diagnosis present

## 2014-10-21 DIAGNOSIS — Z8781 Personal history of (healed) traumatic fracture: Secondary | ICD-10-CM | POA: Diagnosis present

## 2014-10-21 DIAGNOSIS — I480 Paroxysmal atrial fibrillation: Secondary | ICD-10-CM | POA: Diagnosis present

## 2014-10-21 LAB — GLUCOSE, CAPILLARY
Glucose-Capillary: 139 mg/dL — ABNORMAL HIGH (ref 65–99)
Glucose-Capillary: 187 mg/dL — ABNORMAL HIGH (ref 65–99)
Glucose-Capillary: 87 mg/dL (ref 65–99)
Glucose-Capillary: 130 mg/dL — ABNORMAL HIGH (ref 65–99)

## 2014-10-21 LAB — PROTIME-INR
INR: 2.34 — ABNORMAL HIGH (ref 0.00–1.49)
Prothrombin Time: 25.4 seconds — ABNORMAL HIGH (ref 11.6–15.2)

## 2014-10-21 MED ORDER — DIPHENHYDRAMINE HCL 50 MG/ML IJ SOLN: 12.5000 mg | Freq: Four times a day (QID) | INTRAMUSCULAR | Status: DC | PRN

## 2014-10-21 MED ORDER — HYDROMORPHONE 0.3 MG/ML IV SOLN
INTRAVENOUS | Status: DC
  Administered 2014-10-21: 1.7 mg via INTRAVENOUS
  Administered 2014-10-21 (×2): 0.3 mg via INTRAVENOUS
  Administered 2014-10-22: 2.1 mg via INTRAVENOUS
  Administered 2014-10-22: 0.6 mg via INTRAVENOUS
  Administered 2014-10-22 (×2): 2.1 mg via INTRAVENOUS
  Administered 2014-10-22: 0.3 mg via INTRAVENOUS
  Administered 2014-10-23 (×2): 0 mg via INTRAVENOUS
  Filled 2014-10-21 (×2): qty 25

## 2014-10-21 MED ORDER — WARFARIN SODIUM 5 MG PO TABS
5.0000 mg | ORAL_TABLET | ORAL | Status: DC
  Administered 2014-10-23: 5 mg via ORAL
  Filled 2014-10-21: qty 1

## 2014-10-21 MED ORDER — SODIUM CHLORIDE 0.9 % IJ SOLN: 9.0000 mL | INTRAMUSCULAR | Status: DC | PRN

## 2014-10-21 MED ORDER — WARFARIN SODIUM 2.5 MG PO TABS
2.5000 mg | ORAL_TABLET | ORAL | Status: DC
  Administered 2014-10-21 – 2014-10-25 (×4): 2.5 mg via ORAL
  Filled 2014-10-21 (×4): qty 1

## 2014-10-21 MED ORDER — DIPHENHYDRAMINE HCL 12.5 MG/5ML PO ELIX
12.5000 mg | ORAL_SOLUTION | Freq: Four times a day (QID) | ORAL | Status: DC | PRN
  Filled 2014-10-21: qty 5

## 2014-10-21 MED ORDER — NALOXONE HCL 0.4 MG/ML IJ SOLN: 0.4000 mg | INTRAMUSCULAR | Status: DC | PRN

## 2014-10-21 MED ORDER — ONDANSETRON HCL 4 MG/2ML IJ SOLN: 4.0000 mg | Freq: Four times a day (QID) | INTRAMUSCULAR | Status: DC | PRN

## 2014-10-21 NOTE — Progress Notes (Signed)
Subjective: Remains in significant pain/spasm on right mid rib area/back. Dilaudid 0.23m q3prn does work but she feels she has to wait a long time before getting the pain med. Baclofen is not also helping. Is having hard time moving or sitting.   Objective: Vital signs in last 24 hours: Filed Vitals:   10/20/14 1349 10/20/14 1916 10/20/14 2108 10/21/14 0509  BP: 98/55  121/49 110/52  Pulse: 74  75 76  Temp: 98.4 F (36.9 C)  97.8 F (36.6 C) 98.2 F (36.8 C)  TempSrc: Oral  Oral Oral  Resp: 18  22   Height:      Weight:      SpO2: 99% 98% 96% 93%   Weight change:   Intake/Output Summary (Last 24 hours) at 10/21/14 1147 Last data filed at 10/21/14 0957  Gross per 24 hour  Intake    680 ml  Output      0 ml  Net    680 ml   General: sitting up in chair, smiling but grimacing in pain.  HEENT: EOMI, no scleral icterus Cardiac: RRR, no rubs, murmurs or gallops. Pulm: clear to auscultation bilaterally, diminished movement of air Abd: soft, nontender, nondistended, BS present Ext: warm and well perfused, no pedal edema MSK: Tender to palpation over right ribcage area and thoracic area on the right.  No spinal process tenderness Neuro: alert and oriented X3, cranial nerves II-XII grossly intact  Lab Results: Basic Metabolic Panel:  Recent Labs Lab 10/19/14 1749 10/20/14 0126  NA 138 135  K 3.7 3.9  CL 99* 97*  CO2 29 27  GLUCOSE 134* 172*  BUN 15 15  CREATININE 1.06* 1.21*  CALCIUM 9.5 9.1   CBC:  Recent Labs Lab 10/19/14 1749 10/20/14 0126  WBC 10.0 10.0  NEUTROABS 6.8  --   HGB 10.5* 9.5*  HCT 34.7* 31.1*  MCV 90.1 89.6  PLT 294 255   CBG:  Recent Labs Lab 10/20/14 0200 10/20/14 0752 10/20/14 1147 10/20/14 1813 10/20/14 2206 10/21/14 0757  GLUCAP 172* 142* 191* 177* 184* 139*   Coagulation:  Recent Labs Lab 10/15/14 0743 10/16/14 0450 10/20/14 0126 10/21/14 0457  LABPROT 21.0* 20.1* 23.9* 25.4*  INR 1.82* 1.71* 2.15* 2.34*    Urine Drug Screen: Drugs of Abuse     Component Value Date/Time   LABOPIA POSITIVE* 01/07/2013 2114   COCAINSCRNUR NONE DETECTED 01/07/2013 2114   LABBENZ NONE DETECTED 01/07/2013 2114   AMPHETMU NONE DETECTED 01/07/2013 2114   THCU NONE DETECTED 01/07/2013 2114   LABBARB NONE DETECTED 01/07/2013 2114    Urinalysis: No results for input(s): COLORURINE, LABSPEC, PHURINE, GLUCOSEU, HGBUR, BILIRUBINUR, KETONESUR, PROTEINUR, UROBILINOGEN, NITRITE, LEUKOCYTESUR in the last 168 hours.  Invalid input(s): APPERANCEUR  Micro Results: No results found for this or any previous visit (from the past 240 hour(s)). Studies/Results: Dg Chest 2 View  10/19/2014   CLINICAL DATA:  Right flank pain for 3 weeks. Eighth rib fracture diagnosed on x-ray 10/13/2014. History of diabetes and smoking. History of AFib and MVR.  EXAM: CHEST  2 VIEW  COMPARISON:  Chest CT and chest x-ray on 10/13/2014  FINDINGS: Heart is mildly enlarged. Lungs are clear. Patient has had previous median sternotomy and right clavicle ORIF. Deformity of the right posterior lateral sixth, seventh, and eighth ribs consistent with subacute fractures. No pneumothorax.  IMPRESSION: Cardiomegaly.  Subacute right rib fractures.   Electronically Signed   By: ENolon NationsM.D.   On: 10/19/2014 18:22   Dg Thoracic  Spine 4v  10/20/2014   CLINICAL DATA:  Back pain after fall 4 weeks ago  EXAM: THORACIC SPINE - 4+ VIEW  COMPARISON:  None.  FINDINGS: Calcification of the aorta. Normal alignment. Diffuse osteopenia. Mild degenerative disc disease at every level through the thoracic spine. No fracture.  IMPRESSION: No acute findings   Electronically Signed   By: Skipper Cliche M.D.   On: 10/20/2014 13:27   Medications: Scheduled Meds: . antiseptic oral rinse  7 mL Mouth Rinse q12n4p  . buPROPion  100 mg Oral BID  . chlorhexidine  15 mL Mouth Rinse BID  . diclofenac sodium  2 g Topical QID  . fluticasone  2 spray Each Nare Daily  . furosemide   80 mg Oral BID  . gabapentin  300 mg Oral TID  . HYDROmorphone PCA 0.3 mg/mL   Intravenous 6 times per day  . insulin aspart  0-15 Units Subcutaneous TID WC  . insulin detemir  35 Units Subcutaneous BID  . lidocaine  1 patch Transdermal Q24H  . mometasone-formoterol  2 puff Inhalation BID  . nicotine  14 mg Transdermal Daily  . pantoprazole  40 mg Oral Daily  . potassium chloride SA  20 mEq Oral BID  . rosuvastatin  20 mg Oral QHS  . sertraline  200 mg Oral Daily  . tiotropium  18 mcg Inhalation Daily  . warfarin  2.5 mg Oral Once per day on Sun Tue Wed Thu Fri Sat   And  . [START ON 10/23/2014] warfarin  5 mg Oral Once per day on Mon  . Warfarin - Pharmacist Dosing Inpatient   Does not apply q1800   Continuous Infusions:  PRN Meds:.albuterol, ALPRAZolam, diphenhydrAMINE **OR** diphenhydrAMINE, naLOXone (NARCAN)  injection, ondansetron **OR** ondansetron (ZOFRAN) IV, polyethylene glycol, [DISCONTINUED] naloxone **AND** sodium chloride Assessment/Plan: Principal Problem:   Intractable pain Active Problems:   Gastroesophageal reflux disease   Type 2 diabetes mellitus with diabetic neuropathy   Pulmonary emphysema   Osteoporosis   Paroxysmal atrial fibrillation   Tobacco abuse   Rib fractures 64 yo female with CHF, paroxysmal AFib, moderate to severe pulmonary HTN, COPD, and osteoporosis, presenting with 2 week h/o right sided flank and back pain.  Acute Right 6th-8th Displaced Rib Fractures:  - fell from recliner 4 weeks ago, has osteoporesis. -Tspine xray negative. Readmitted for uncontrolled pain. Dilaudid IV 0.51m IV q3hr PRN not helping -continue Dilaudid 0.555mIV q3h prn.   - will try Dilaudid PCA.-Naloxone if needed -continue lidocaine patch, voltaren. Stop baclofen since no helping.  -incentive spirometry -PT/OT to evaluate and treat.  They recommend home health PT at d/c  Paroxysmal Atrial Fibrillation: -Warfarin per pharmacy   COPD/Tobacco Abuse: Patient is on  Albuterol, Advair and Spriva at home. Patient counseled that current smoking will delay fracture healing. - Supplemental O2 as needed - Albuterol prn - Dulera bid - Spiriva daily - Nicotine Patch  T2DM: patient is on Levemir 45 units BID and Novolog 18 units with meals. - SSI-M - Levemir 35 units BID - Carb mod diet - Gabapentin 300 mg TID  Chronic dCHF: Not clinically in CHF exacerbation. Patient is on Lasix 80 mg BID and Kdur 20 mEq BID at home. - Continue Lasix and Kdur  Depression: Patient is on Xanax 0.5-1 mg BID prn, wellbutrin 100 mg BID and zoloft 200 mg daily. - Continue Wellbutrin and Zoloft - Xanax prn  GERD: On omeprazole 40 mg daily at home. - Protonix 40 mg daily  HLD:  On rosuvastatin 20 mg daily at home. - Continue rosuvastatin  Dispo: Disposition is deferred at this time, awaiting improvement of current medical problems.     The patient does have a current PCP [name], MD) and does need an [facility] hospital follow-up appointment after discharge.  The patient does not have transportation limitations that hinder transportation to clinic appointments.     Dellia Nims, MD 10/21/2014, 11:47 AM

## 2014-10-21 NOTE — Progress Notes (Signed)
Internal Medicine Attending  Date: 10/21/2014  Patient name: [name] Medical record number: [id number] Date of birth: 1950/06/27 Age: 64 y.o. Gender: female  I saw and evaluated the patient. I reviewed the resident's note by Dr. Genene Churn and I agree with the resident's findings and plans as documented in his progress note.  When seen on rounds this morning [name] was in considerable pain related to her rib fractures. Although the Dilaudid was effective when she received it there were delays in her getting the pain medication. We therefore decided to switch therapy to a patient-controlled analgesia method to allow her more timely receipt of her narcotic pain medication. This will also allow [place] to calculate the 24 hour dose she requires for pain relief. This then can be converted to an equivilant oral regimen upon discharge. When I saw her this evening, after starting the PCA, she stated she was much better with marked pain relief. It still was hurting with coughing or movement but she felt she would be able to rest tonight. We will reassess her control on the PCA tomorrow.

## 2014-10-21 NOTE — Care Management Note (Signed)
Case Management Note  Patient Details  Name: [name] MRN: [id number] Date of Birth: 10/21/1950  Subjective/Objective:                  pain  Action/Plan: Home with home health PT  Expected Discharge Date:                  Expected Discharge Plan:  [place]  In-House Referral:     Discharge planning Services  CM Consult  Post Acute Care Choice:  Home Health Choice offered to:     DME Arranged:  N/A DME Agency:  NA  HH Arranged:  PT [place] Agency:  [place]  Status of Service:  In process, will continue to follow  Medicare Important Message Given:    Date Medicare IM Given:    Medicare IM give by:    Date Additional Medicare IM Given:    Additional Medicare Important Message give by:     If discussed at [place] of Stay Meetings, dates discussed:    Additional Comments: CM spoke with patient at the bedside. PT has recommended HHPT. Patient selects [facility] for HHPT. Kristen at [facility] notified.  Apolonio Schneiders, RN 10/21/2014, 12:36 PM

## 2014-10-21 NOTE — Progress Notes (Signed)
ANTICOAGULATION CONSULT NOTE - Initial Consult  Pharmacy Consult for Coumadin Indication: atrial fibrillation  Allergies  Allergen Reactions  . Lorazepam     Patient's sister noted that ativan caused the patient to become extremely confused during hospitalization 09/2010  . Morphine And Related Other (See Comments)  . Oxycontin [Oxycodone] Other (See Comments)    headache  . Tramadol Hcl     REACTION: swelling    Patient Measurements: Height: _0  (157.5 cm) Weight: 184 lb 8.4 oz (83.7 kg) IBW/kg (Calculated) : 50.1  Vital Signs: Temp: 98.2 F (36.8 C) (09/10 0509) Temp Source: Oral (09/10 0509) BP: 110/52 mmHg (09/10 0509) Pulse Rate: 76 (09/10 0509)  Labs:  Recent Labs  10/19/14 1749 10/20/14 0126 10/21/14 0457  HGB 10.5* 9.5*  --   HCT 34.7* 31.1*  --   PLT 294 255  --   LABPROT  --  23.9* 25.4*  INR  --  2.15* 2.34*  CREATININE 1.06* 1.21*  --     Estimated Creatinine Clearance: 46.5 mL/min (by C-G formula based on Cr of 1.21).  Assessment: 64 yo female admitted for 2 week hx of r-sided flank and back pain. Recent rip fractures. Rib fx in 6/7/8  PMH: CHF, AFib, Pulm HTN, COPD, osteoporosis  AC: on warfarin PTA for a fib. Last dose 9/7 PTA home dose is 2.5 mg daily, except 5 mg on Mon INR 2.15, Plts WNL, Hgb low but stable  Renal: SCr 1.21   Goal of Therapy:  INR 2-3   Plan:  Resume home regimen of 2.5 mg daily, except 5 mg on Mon Daily PT INR, CBC q72h Monitor for S/sx of bleeding   Joya San, PharmD Clinical Pharmacy Resident Pager # [phone] 10/21/2014 8:53 AM

## 2014-10-22 LAB — PROTIME-INR
INR: 2.13 — ABNORMAL HIGH (ref 0.00–1.49)
Prothrombin Time: 23.7 seconds — ABNORMAL HIGH (ref 11.6–15.2)

## 2014-10-22 LAB — GLUCOSE, CAPILLARY
Glucose-Capillary: 105 mg/dL — ABNORMAL HIGH (ref 65–99)
Glucose-Capillary: 150 mg/dL — ABNORMAL HIGH (ref 65–99)
Glucose-Capillary: 115 mg/dL — ABNORMAL HIGH (ref 65–99)
Glucose-Capillary: 124 mg/dL — ABNORMAL HIGH (ref 65–99)

## 2014-10-22 MED ORDER — BACLOFEN 10 MG PO TABS
10.0000 mg | ORAL_TABLET | Freq: Three times a day (TID) | ORAL | Status: DC
  Administered 2014-10-22 – 2014-10-23 (×4): 10 mg via ORAL
  Filled 2014-10-22 (×4): qty 1

## 2014-10-22 NOTE — Progress Notes (Signed)
ANTICOAGULATION CONSULT NOTE - Initial Consult  Pharmacy Consult for Coumadin Indication: atrial fibrillation  Allergies  Allergen Reactions  . Lorazepam     Patient's sister noted that ativan caused the patient to become extremely confused during hospitalization 09/2010  . Morphine And Related Other (See Comments)  . Oxycontin [Oxycodone] Other (See Comments)    headache  . Tramadol Hcl     REACTION: swelling    Patient Measurements: Height: _0  (157.5 cm) Weight: 184 lb 8.4 oz (83.7 kg) IBW/kg (Calculated) : 50.1  Vital Signs: Temp: 98.6 F (37 C) (09/11 0508) Temp Source: Oral (09/11 0508) BP: 112/45 mmHg (09/11 0508) Pulse Rate: 72 (09/11 0508)  Labs:  Recent Labs  10/19/14 1749 10/20/14 0126 10/21/14 0457 10/22/14 0525  HGB 10.5* 9.5*  --   --   HCT 34.7* 31.1*  --   --   PLT 294 255  --   --   LABPROT  --  23.9* 25.4* 23.7*  INR  --  2.15* 2.34* 2.13*  CREATININE 1.06* 1.21*  --   --     Estimated Creatinine Clearance: 46.5 mL/min (by C-G formula based on Cr of 1.21).  Assessment: 64 yo female admitted for 2 week hx of r-sided flank and back pain. Recent rip fractures. Rib fx in 6/7/8  PMH: CHF, AFib, Pulm HTN, COPD, osteoporosis, T2DM  AC: on warfarin PTA for a fib.  PTA home dose is 2.5 mg daily, except 5 mg on Mon INR 2.13 - pt has been therapeutic x3 days on home regimen  Plts WNL, Hgb low at 9.5 (on 9/9)  Renal: SCr 1.21 (last check on 9/9)   Goal of Therapy:  INR 2-3   Plan:  Resume home regimen of 2.5 mg daily, except 5 mg on Mon PT INR MWF, CBC q72h Monitor for S/sx of bleeding   Joya San, PharmD Clinical Pharmacy Resident Pager # [phone] 10/22/2014 9:01 AM

## 2014-10-22 NOTE — Progress Notes (Signed)
Subjective: States her pain improved with Dilaudid PCA and this is reflected on pain assessment scale in MAR through EPIC.  Unfortunately, still having spasm that she cannot determine what seems to aggravate this.  She is trying her Lidocaine patch today more anterior today versus posterior in the hopes this will help. Otherwise, no new complaints other than feeling sleepy.  She is eating well.   Objective: Vital signs in last 24 hours: Filed Vitals:   10/22/14 0000 10/22/14 0400 10/22/14 0508 10/22/14 0711  BP:   112/45   Pulse:   72   Temp:   98.6 F (37 C)   TempSrc:   Oral   Resp: _0 Height:      Weight:      SpO2: 98% 100% 99% 92%   Weight change:   Intake/Output Summary (Last 24 hours) at 10/22/14 0856 Last data filed at 10/22/14 0757  Gross per 24 hour  Intake    700 ml  Output      0 ml  Net    700 ml   General: sitting up in chair, about to eat breakfast, smiling but grimacing in pain occasionally that appear to coincide with spasm.  HEENT: EOMI, no scleral icterus Cardiac: RRR, no rubs, murmurs or gallops. Pulm: clear to auscultation bilaterally, diminished movement of air, tolerating incentive spirometer Abd: soft, nontender, nondistended, BS present Ext: warm and well perfused, no pedal edema MSK: Tender to palpation over right ribcage area and thoracic area on the right.  No spinal process tenderness.  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 Neuro: alert and oriented X3, cranial nerves II-XII grossly intact  Lab Results: Basic Metabolic Panel:  Recent Labs Lab 10/19/14 1749 10/20/14 0126  NA 138 135  K 3.7 3.9  CL 99* 97*  CO2 29 27  GLUCOSE 134* 172*  BUN 15 15  CREATININE 1.06* 1.21*  CALCIUM 9.5 9.1   CBC:  Recent Labs Lab 10/19/14 1749 10/20/14 0126  WBC 10.0 10.0  NEUTROABS 6.8  --   HGB 10.5* 9.5*  HCT 34.7* 31.1*  MCV 90.1 89.6  PLT 294 255   CBG:  Recent  Labs Lab 10/20/14 2206 10/21/14 0757 10/21/14 1152 10/21/14 1628 10/21/14 2138 10/22/14 0745  GLUCAP 184* 139* 187* 87 130* 105*   Coagulation:  Recent Labs Lab 10/16/14 0450 10/20/14 0126 10/21/14 0457 10/22/14 0525  LABPROT 20.1* 23.9* 25.4* 23.7*  INR 1.71* 2.15* 2.34* 2.13*   Urine Drug Screen: Drugs of Abuse     Component Value Date/Time   LABOPIA POSITIVE* 01/07/2013 2114   COCAINSCRNUR NONE DETECTED 01/07/2013 2114   LABBENZ NONE DETECTED 01/07/2013 2114   AMPHETMU NONE DETECTED 01/07/2013 2114   THCU NONE DETECTED 01/07/2013 2114   LABBARB NONE DETECTED 01/07/2013 2114    Studies/Results: Dg Thoracic Spine 4v  10/20/2014   CLINICAL DATA:  Back pain after fall 4 weeks ago  EXAM: THORACIC SPINE - 4+ VIEW  COMPARISON:  None.  FINDINGS: Calcification of the aorta. Normal alignment. Diffuse osteopenia. Mild degenerative disc disease at every level through the thoracic spine. No fracture.  IMPRESSION: No acute findings   Electronically Signed   By: Skipper Cliche M.D.   On: 10/20/2014 13:27   Medications: Scheduled Meds: . antiseptic oral rinse  7 mL Mouth Rinse q12n4p  . buPROPion  100 mg Oral BID  . chlorhexidine  15 mL Mouth Rinse BID  .  diclofenac sodium  2 g Topical QID  . fluticasone  2 spray Each Nare Daily  . furosemide  80 mg Oral BID  . gabapentin  300 mg Oral TID  . HYDROmorphone PCA 0.3 mg/mL   Intravenous 6 times per day  . insulin aspart  0-15 Units Subcutaneous TID WC  . insulin detemir  35 Units Subcutaneous BID  . lidocaine  1 patch Transdermal Q24H  . mometasone-formoterol  2 puff Inhalation BID  . nicotine  14 mg Transdermal Daily  . pantoprazole  40 mg Oral Daily  . potassium chloride SA  20 mEq Oral BID  . rosuvastatin  20 mg Oral QHS  . sertraline  200 mg Oral Daily  . tiotropium  18 mcg Inhalation Daily  . warfarin  2.5 mg Oral Once per day on Sun Tue Wed Thu Fri Sat   And  . [START ON 10/23/2014] warfarin  5 mg Oral Once per day on  Mon  . Warfarin - Pharmacist Dosing Inpatient   Does not apply q1800   Continuous Infusions:  PRN Meds:.albuterol, ALPRAZolam, diphenhydrAMINE **OR** diphenhydrAMINE, naLOXone (NARCAN)  injection, ondansetron **OR** ondansetron (ZOFRAN) IV, polyethylene glycol, [DISCONTINUED] naloxone **AND** sodium chloride Assessment/Plan: Principal Problem:   Intractable pain Active Problems:   Gastroesophageal reflux disease   Type 2 diabetes mellitus with diabetic neuropathy   Pulmonary emphysema   Osteoporosis   Paroxysmal atrial fibrillation   Tobacco abuse   Rib fractures   Rib pain on right side 64 yo female with CHF, paroxysmal AFib, moderate to severe pulmonary HTN, COPD, and osteoporosis, presenting with 2 week h/o right sided flank and back pain.  Acute Right 6th-8th Displaced Rib Fractures: fell from recliner 4 weeks ago, has osteoporesis. T-spine xray negative. Readmitted for uncontrolled pain.   -Dilaudid PCA 0.33m/mL 6 times/day. -Naloxone if needed. -continue Lidoderm patch and Voltaren gel.  -restart Baclofen 163mPO tid to see if this helps with spasm now that pain appears improving -incentive spirometry -PT/OT to evaluate and treat.  They recommend home health PT at d/c  Paroxysmal Atrial Fibrillation: -Warfarin per pharmacy   COPD/Tobacco Abuse: Patient is on Albuterol, Advair and Spriva at home. Patient counseled that current smoking will delay fracture healing. - Supplemental O2 as needed - Albuterol prn - Dulera bid - Spiriva daily - Nicotine Patch  T2DM: patient is on Levemir 45 units BID and Novolog 18 units with meals. - SSI-M - Levemir 35 units BID - Carb mod diet - Gabapentin 300 mg TID  Chronic dCHF: Not clinically in CHF exacerbation. Patient is on Lasix 80 mg BID and Kdur 20 mEq BID at home. - Continue Lasix and Kdur  Depression: Patient is on Xanax 0.5-1 mg BID prn, wellbutrin 100 mg BID and zoloft 200 mg daily. - Continue Wellbutrin and Zoloft -  Xanax prn  GERD: On omeprazole 40 mg daily at home. - Protonix 40 mg daily  HLD: On rosuvastatin 20 mg daily at home. - Continue rosuvastatin  Dispo: Disposition is deferred at this time, awaiting improvement of current medical problems.     The patient does have a current PCP (LOval LinseyMD) [facility].  The patient does not have transportation limitations that hinder transportation to clinic appointments.   LOS: 1 day   AnJule SerDO 10/22/2014, 8:56 AM

## 2014-10-22 NOTE — Progress Notes (Signed)
Internal Medicine Attending  Date: 10/22/2014  Patient name: [name] Medical record number: [id number] Date of birth: 01-31-1951 Age: 64 y.o. Gender: female  I saw and evaluated the patient. I reviewed the resident's note by Dr. Juleen China and I agree with the resident's findings and plans as documented in his progress note.  [name] tells me that her pain is not much improved on the PCA. She admits to not using the as needed button as often because she did not want to appear to be a drug addict. I told her it was very important that she manage her pain so that we could calculate her total daily dose and better assure that upon discharge we will be closer to her actual narcotic needs. She continues to have spasms and the baclofen was restarted today. We had a long discussion about her osteoporosis and she is now willing to start bisphosphonate therapy, specifically alendronate at 70 mg by mouth weekly. In the outpatient setting we will try to arrange for transportation to pulmonary rehabilitation which is something she very much enjoyed and regrets not being able to do now. If we can arrange transportation once she is improved from a rib fracture pain standpoint it may improve her overall outlook.

## 2014-10-23 ENCOUNTER — Ambulatory Visit: Payer: PPO | Admitting: Internal Medicine

## 2014-10-23 ENCOUNTER — Ambulatory Visit: Payer: PPO

## 2014-10-23 DIAGNOSIS — F05 Delirium due to known physiological condition: Secondary | ICD-10-CM

## 2014-10-23 LAB — CBC
HCT: 29.4 % — ABNORMAL LOW (ref 36.0–46.0)
Hemoglobin: 8.9 g/dL — ABNORMAL LOW (ref 12.0–15.0)
MCH: 27.2 pg (ref 26.0–34.0)
MCHC: 30.3 g/dL (ref 30.0–36.0)
MCV: 89.9 fL (ref 78.0–100.0)
Platelets: 242 10*3/uL (ref 150–400)
RBC: 3.27 MIL/uL — ABNORMAL LOW (ref 3.87–5.11)
RDW: 16.2 % — ABNORMAL HIGH (ref 11.5–15.5)
WBC: 9.8 10*3/uL (ref 4.0–10.5)

## 2014-10-23 LAB — GLUCOSE, CAPILLARY
Glucose-Capillary: 84 mg/dL (ref 65–99)
Glucose-Capillary: 140 mg/dL — ABNORMAL HIGH (ref 65–99)
Glucose-Capillary: 106 mg/dL — ABNORMAL HIGH (ref 65–99)

## 2014-10-23 LAB — PROTIME-INR
INR: 1.93 — ABNORMAL HIGH (ref 0.00–1.49)
Prothrombin Time: 22 seconds — ABNORMAL HIGH (ref 11.6–15.2)

## 2014-10-23 MED ORDER — WARFARIN SODIUM 5 MG PO TABS: 5.0000 mg | ORAL_TABLET | Freq: Once | ORAL | Status: DC

## 2014-10-23 NOTE — Progress Notes (Signed)
Pt is having increased confusion. Pt pulled out IV x 2. IV team nurse suggested calling MD to reevaluate need for PCA pump. I paged MD on-call. No return paged. Vitals are stable including respiratory. Will continue to monitor.

## 2014-10-23 NOTE — Progress Notes (Signed)
Subjective: Today, patient seen at examined at bedside.  She has had a change in mental status from previous days when seen.  She appears paranoid, confused, and somewhat delirious this morning.  Last night she apparently pulled out both her IV lines.  Otherwise she is moving around in bed, appearing to be still uncomfortable but appears better from a pain standpoint.  She is able to use her right arm to point towards IV machines in the room.    Objective: Vital signs in last 24 hours: Filed Vitals:   10/23/14 0626 10/23/14 0838 10/23/14 1131 10/23/14 1416  BP: 128/62 138/41 138/66 136/43  Pulse: 80 59 80 83  Temp: 98.7 F (37.1 C)   99 F (37.2 C)  TempSrc: Oral   Oral  Resp: _0 Height:      Weight:      SpO2: 97% 98%  98%   Weight change:   Intake/Output Summary (Last 24 hours) at 10/23/14 1706 Last data filed at 10/23/14 1400  Gross per 24 hour  Intake    720 ml  Output      0 ml  Net    720 ml   General: lying in bed, confused, paranoid, somewhat delirious but fairly redirectable HEENT: EOMI, no scleral icterus Cardiac: RRR, no rubs, murmurs or gallops. Pulm: clear to auscultation bilaterally, diminished movement of air, tolerating incentive spirometer Abd: soft, nontender, nondistended, BS present Ext: warm and well perfused, no pedal edema  Lab Results: Basic Metabolic Panel:  Recent Labs Lab 10/19/14 1749 10/20/14 0126  NA 138 135  K 3.7 3.9  CL 99* 97*  CO2 29 27  GLUCOSE 134* 172*  BUN 15 15  CREATININE 1.06* 1.21*  CALCIUM 9.5 9.1   CBC:  Recent Labs Lab 10/19/14 1749 10/20/14 0126 10/23/14 0430  WBC 10.0 10.0 9.8  NEUTROABS 6.8  --   --   HGB 10.5* 9.5* 8.9*  HCT 34.7* 31.1* 29.4*  MCV 90.1 89.6 89.9  PLT 294 255 242   CBG:  Recent Labs Lab 10/22/14 0745 10/22/14 1155 10/22/14 1719 10/22/14 2209 10/23/14 0749 10/23/14 1211  GLUCAP 105* 150* 115* 124* 84 140*   Coagulation:  Recent Labs Lab 10/20/14 0126  10/21/14 0457 10/22/14 0525 10/23/14 0430  LABPROT 23.9* 25.4* 23.7* 22.0*  INR 2.15* 2.34* 2.13* 1.93*   Urine Drug Screen: Drugs of Abuse     Component Value Date/Time   LABOPIA POSITIVE* 01/07/2013 2114   COCAINSCRNUR NONE DETECTED 01/07/2013 2114   LABBENZ NONE DETECTED 01/07/2013 2114   AMPHETMU NONE DETECTED 01/07/2013 2114   THCU NONE DETECTED 01/07/2013 2114   LABBARB NONE DETECTED 01/07/2013 2114    Studies/Results: No results found. Medications: Scheduled Meds: . antiseptic oral rinse  7 mL Mouth Rinse q12n4p  . buPROPion  100 mg Oral BID  . chlorhexidine  15 mL Mouth Rinse BID  . diclofenac sodium  2 g Topical QID  . fluticasone  2 spray Each Nare Daily  . furosemide  80 mg Oral BID  . gabapentin  300 mg Oral TID  . insulin aspart  0-15 Units Subcutaneous TID WC  . insulin detemir  35 Units Subcutaneous BID  . lidocaine  1 patch Transdermal Q24H  . mometasone-formoterol  2 puff Inhalation BID  . nicotine  14 mg Transdermal Daily  . pantoprazole  40 mg Oral Daily  . potassium chloride SA  20 mEq Oral BID  . rosuvastatin  20 mg Oral QHS  .  sertraline  200 mg Oral Daily  . tiotropium  18 mcg Inhalation Daily  . warfarin  2.5 mg Oral Once per day on Sun Tue Wed Thu Fri Sat   And  . warfarin  5 mg Oral Once per day on Mon  . Warfarin - Pharmacist Dosing Inpatient   Does not apply q1800   Continuous Infusions:  PRN Meds:.albuterol, ALPRAZolam, naLOXone (NARCAN)  injection, ondansetron **OR** ondansetron (ZOFRAN) IV, polyethylene glycol Assessment/Plan: Principal Problem:   Intractable pain Active Problems:   Gastroesophageal reflux disease   Type 2 diabetes mellitus with diabetic neuropathy   Pulmonary emphysema   Osteoporosis   Paroxysmal atrial fibrillation   Tobacco abuse   Rib fractures   Rib pain on right side 64 yo female with CHF, paroxysmal AFib, moderate to severe pulmonary HTN, COPD, and osteoporosis, presenting with 2 week h/o right sided  flank and back pain.  Acute Right 6th-8th Displaced Rib Fractures: fell from recliner 4 weeks ago, has osteoporesis. T-spine xray negative. Readmitted for uncontrolled pain.   -She is experiencing an acute change in status with paranoia, delrium.  Her pain does appear to be improved today -discontinue Dilaudid PCA 0.91m/mL 6 times/day. -discontinue baclofen -will monitor her pain tomorrow and determine what pain regimen to proceed with.  For now, will treat locally and avoid systemic therapy -Naloxone if needed -continue Lidoderm patch and Voltaren gel.  -incentive spirometry -PT/OT to evaluate and treat.  They recommend home health PT at d/c  Paroxysmal Atrial Fibrillation: -Warfarin per pharmacy   COPD/Tobacco Abuse: Patient is on Albuterol, Advair and Spriva at home. Patient counseled that current smoking will delay fracture healing. - Supplemental O2 as needed - Albuterol prn - Dulera bid - Spiriva daily - Nicotine Patch  T2DM: patient is on Levemir 45 units BID and Novolog 18 units with meals. - SSI-M - Levemir 35 units BID - Carb mod diet - Gabapentin 300 mg TID  Chronic dCHF: Not clinically in CHF exacerbation. Patient is on Lasix 80 mg BID and Kdur 20 mEq BID at home. - Continue Lasix and Kdur  Depression: Patient is on Xanax 0.5-1 mg BID prn, wellbutrin 100 mg BID and zoloft 200 mg daily. - Continue Wellbutrin and Zoloft - Xanax prn  GERD: On omeprazole 40 mg daily at home. - Protonix 40 mg daily  HLD: On rosuvastatin 20 mg daily at home. - Continue rosuvastatin  Dispo: Disposition is deferred at this time, awaiting improvement of current medical problems.     The patient does have a current PCP ([name] MD) and [facility]  The patient does not have transportation limitations that hinder transportation to clinic appointments.   LOS: 2 days   AJule Ser DO 10/23/2014, 5:06 PM

## 2014-10-23 NOTE — Progress Notes (Signed)
PT Cancellation Note  Patient Details Name: [name] MRN: [id number] DOB: [birth year]   Cancelled Treatment:    Reason Eval/Treat Not Completed: Medical issues which prohibited therapy (Pt very confused.)   MAYCOCK,CARY 10/23/2014, 12:52 PM  [facility] PT [phone]

## 2014-10-23 NOTE — Evaluation (Signed)
I am [name]'s primary care provider and went to visit her in the hospital this morning. I found a marked change in her status compared to last evening when I last saw her. She is much more paranoid and frankly delirious. I am concerned this may be related to either the dilaudid or the baclofen which we started over the weekend for her rib fracture pain and muscle spasms. These medications have been stopped. Although she is somewhat uncomfortable when she moves, this clinically is much improved than over the previous several days. I appreciate the [facility] team for their care and agree with stopping the PCA, baclofen, and any other unnecessary tethers such as the cardiac telemetry monitoring. I had a long conversation with [name] assuring her that her treatment team was doing an excellent job and their interests only align with her's. I will continue to visit on a daily basis as I can. Thanks again for your excellent care.

## 2014-10-23 NOTE — Progress Notes (Signed)
Patient seen and examined. Case d/w residents in detail. I agree with findings and plan as documented.   Patient is confused and paranoid today. Possibly secondary to baclofen or opiates. Will hold both for now and monitor. Will c/w lidoderm patch and voltaren gel.

## 2014-10-23 NOTE — Progress Notes (Signed)
PCA stopped. Wasted 14.55m in syringe of hydromorphine HCL 0.343mml with NaSigne ColtRN into sink.

## 2014-10-23 NOTE — Progress Notes (Signed)
Patient brought home mask tubing and machine. Home machine was not working. Used hospital machine and tubing with patient mask. Patient is tolerating well. SPO2 95%. 2 LPM nasal cannula on under mask and hooked up with PCA pump.

## 2014-10-23 NOTE — Progress Notes (Signed)
Patient very agitated and anxious.   Family at bedside xanax refused by patient states that the medication is not goof for her will continue to monitor.

## 2014-10-23 NOTE — Progress Notes (Signed)
ANTICOAGULATION CONSULT NOTE - Initial Consult  Pharmacy Consult for Coumadin Indication: atrial fibrillation  Allergies  Allergen Reactions  . Lorazepam     Patient's sister noted that ativan caused the patient to become extremely confused during hospitalization 09/2010  . Morphine And Related Other (See Comments)  . Oxycontin [Oxycodone] Other (See Comments)    headache  . Tramadol Hcl     REACTION: swelling    Patient Measurements: Height: _0  (157.5 cm) Weight: 184 lb 8.4 oz (83.7 kg) IBW/kg (Calculated) : 50.1  Vital Signs: Temp: 98.7 F (37.1 C) (09/12 0626) Temp Source: Oral (09/12 0626) BP: 128/62 mmHg (09/12 0626) Pulse Rate: 80 (09/12 0626)  Labs:  Recent Labs  10/21/14 0457 10/22/14 0525 10/23/14 0430  HGB  --   --  8.9*  HCT  --   --  29.4*  PLT  --   --  242  LABPROT 25.4* 23.7* 22.0*  INR 2.34* 2.13* 1.93*    Estimated Creatinine Clearance: 46.5 mL/min (by C-G formula based on Cr of 1.21).  Assessment: 64 yo female admitted for 2 week hx of r-sided flank and back pain. Recent rip fractures. Rib fx in 6/7/8  PMH: CHF, AFib, Pulm HTN, COPD, osteoporosis, T2DM  On warfarin PTA for Afib. INR had been therapeutic for 3 days on home regimen. Today it is slightly subtherapeutic at 1.93. Hgb low at 8.9, plts wnl. No s/s of bleed  Goal of Therapy:  INR 2-3   Plan:  Continue home regimen of 2.5 mg daily, except 5 mg on Mon Monitor daily INR, CBC, s/s of bleed  Elenor Quinones, PharmD Clinical Pharmacist Pager [phone] 10/23/2014 8:32 AM

## 2014-10-23 NOTE — Care Management Note (Signed)
Case Management Note  Patient Details  Name: MAKENZEE CHOUDHRY MRN: [id number] Date of Birth: June 01, 1950  Subjective/Objective:          Patient lives at home alone, she has home oxygen with [facility] as well.  Patient states she has transportation home at discharge.  She has insurance and PCP.  Pt eval rec hhpt, patient has chosen [facility], for right now she just wants physical therapy.  NCM will cont to follow for de needs.          Action/Plan:   Expected Discharge Date:                  Expected Discharge Plan:  [place]  In-House Referral:     Discharge planning Services  CM Consult  Post Acute Care Choice:  Home Health Choice offered to:     DME Arranged:  N/A DME Agency:  NA  HH Arranged:  PT [place] Agency:  [place]  Status of Service:  In process, will continue to follow  Medicare Important Message Given:    Date Medicare IM Given:    Medicare IM give by:    Date Additional Medicare IM Given:    Additional Medicare Important Message give by:     If discussed at [place] of Stay Meetings, dates discussed:    Additional Comments:  Zenon Mayo, RN 10/23/2014, 12:12 PM

## 2014-10-24 LAB — GLUCOSE, CAPILLARY
Glucose-Capillary: 108 mg/dL — ABNORMAL HIGH (ref 65–99)
Glucose-Capillary: 132 mg/dL — ABNORMAL HIGH (ref 65–99)
Glucose-Capillary: 137 mg/dL — ABNORMAL HIGH (ref 65–99)
Glucose-Capillary: 202 mg/dL — ABNORMAL HIGH (ref 65–99)
Glucose-Capillary: 90 mg/dL (ref 65–99)

## 2014-10-24 MED ORDER — HYDROMORPHONE HCL 1 MG/ML IJ SOLN: 0.5000 mg | Freq: Once | INTRAMUSCULAR | Status: DC | PRN

## 2014-10-24 NOTE — Progress Notes (Signed)
Family aided in given patient xanax very paranoid at present time will continue to monitor.

## 2014-10-24 NOTE — Progress Notes (Signed)
Patient seen and examined. Case d/w residents in detail. I agree with findings and plan as documented in Dr. Alcario Drought note.  Patient with continued delirium and paranoia. We will continue to hold opiates and baclofen for now. Would consider working up patient for underlying infection contributing to delirium. Will check u/a, urine cx, chest x ray. Will c/w local pain control with voltaren gel and lidocaine patch.

## 2014-10-24 NOTE — Care Management Important Message (Signed)
Important Message  Patient Details  Name: [name] MRN: [id number] Date of Birth: December 24, 1950   Medicare Important Message Given:  Yes-second notification given    Nathen May 10/24/2014, 10:53 AM

## 2014-10-24 NOTE — Progress Notes (Signed)
Physical Therapy Treatment Patient Details Name: [name] MRN: [id number] DOB: 01-Jun-1950 Today's Date: 10/24/2014    History of Present Illness 64 y.o. female with a PMH of COPD, pulmonary HTN, tobacco abuse, CHF, osteoporosis who presents to the ED with right sided rib pain and back pain. She was recently admitted for right 6th and 8th displaced rib fractures; she was discharged 09/05. Reports worsening pain since the time of her discharge, and states the pain medication she was given has not been effective in controlling her pain. States her symptoms are constant. Movement exacerbates her pain; her prescribed pain medication has given her minimal relief. She denies fever, chills, lightheadedness, syncope. Reports intermittent dizziness. Denies chest pain, shortness of breath, abdominal pain, N/V/D/C, dysuria, urgency, frequency. Reports cough, unchanged from baseline.    PT Comments    Patient making progress and able to ambulate this session. She will have assitance from her granddaughter at home. COntinue with current POC  Follow Up Recommendations  Home health PT;Supervision for mobility/OOB     Equipment Recommendations  None recommended by PT    Recommendations for Other Services       Precautions / Restrictions Precautions Precautions: Fall Restrictions Weight Bearing Restrictions: No    Mobility  Bed Mobility   Bed Mobility: Sidelying to Sit;Rolling Rolling: Min guard   Supine to sit: Min assist     General bed mobility comments: Cues for technique - requires support for trunk due to rib pain. Use of rails  Transfers Overall transfer level: Needs assistance Equipment used: Rolling walker (2 wheeled) Transfers: Sit to/from Stand Sit to Stand: Min guard         General transfer comment: Minguard to ensure safety  Ambulation/Gait Ambulation/Gait assistance: Min assist Ambulation Distance (Feet): 90 Feet Assistive device: Rolling walker (2  wheeled) Gait Pattern/deviations: Step-through pattern;Decreased stride length Gait velocity: guarded due to pain Gait velocity interpretation: Below normal speed for age/gender General Gait Details: Min A to ensure balance. Two times patient required A after a stumble. Patient reports using rollator at home. Two standing rest break   Financial trader Rank[employer] (Stroke Patients Only)       Balance                                    Cognition Arousal/Alertness: Awake/alert Behavior During Therapy: Anxious Overall Cognitive Status: Impaired/Different from baseline Area of Impairment: Safety/judgement     Memory: Decreased short-term memory;Decreased recall of precautions   Safety/Judgement: Decreased awareness of safety     General Comments: Per NR report and my observation patient appears paranoid of surroundings. very intuned to where her stuff is, especially her cell phone    Exercises      General Comments        Pertinent Vitals/Pain Pain Assessment: Faces Faces Pain Scale: Hurts whole lot Pain Location: With movement out of bed - ribs Pain Descriptors / Indicators: Aching;Sore Pain Intervention(s): Monitored during session    Home Living                      Prior Function            PT Goals (current goals can now be found in the care plan section) Progress towards PT goals: Progressing toward goals    Frequency  Min  3X/week    PT Plan Current plan remains appropriate    Co-evaluation             End of Session   Activity Tolerance: Patient tolerated treatment well Patient left: in chair;with call bell/phone within reach     Time: 0855-0920 PT Time Calculation (min) (ACUTE ONLY): 25 min  Charges:  $Gait Training: 8-22 mins $Therapeutic Activity: 8-22 mins                    G Codes:      Jacqualyn Posey 10/24/2014, 9:27 AM 10/24/2014 Robin[place], Tonia Brooms PTA

## 2014-10-24 NOTE — Progress Notes (Signed)
Patient not ready for CPAP at this time. Family at bedside. Stated would call when ready.

## 2014-10-24 NOTE — Progress Notes (Signed)
Subjective: Patient seen and examined at bedside this morning, no family currently present.  Patient states they were there last night.  She remains somewhat paranoid and confused today, although better than yesterday.  She is alert and oriented to person, place, time.  She is wearing her nasal cannula on her forehead, stating if she puts it in her nose it will make her pass out. Also not eating her meals b/c thinks it is being poisoned.  She does not complain of too much pain.  When asked how her pain is she states it is the same.  She is able to move in bed and grimaces appropriately given her 2 healing rib fractures.  When seen later on morning rounds, she is lying in the recliner, grimacing in pain especially with movement.  Wishes to be moved to the [employer].  Reviewing PT notes from this morning, patient able to ambulate some which has been encouraged.   Objective: Vital signs in last 24 hours: Filed Vitals:   10/23/14 2134 10/24/14 0130 10/24/14 0520 10/24/14 1433  BP:   140/50 144/53  Pulse:  81 80 82  Temp:   98 F (36.7 C) 98 F (36.7 C)  TempSrc:   Oral Oral  Resp:  _0 Height:      Weight:      SpO2: 97% 97% 97% 96%   Weight change:   Intake/Output Summary (Last 24 hours) at 10/24/14 1453 Last data filed at 10/24/14 1441  Gross per 24 hour  Intake    240 ml  Output    600 ml  Net   -360 ml   General: lying in bed, uncomfortable appearing when moving, paranoid HEENT: EOMI, no scleral icterus Cardiac: RRR, no rubs, murmurs or gallops. Pulm: clear to auscultation bilaterally, diminished movement of air, tolerating incentive spirometer Abd: soft, nontender, nondistended, BS present MSK: palpation of her right ribcage area elicits response of grimacing and tensing of body Ext: warm and well perfused, no pedal edema  Lab Results: Basic Metabolic Panel:  Recent Labs Lab 10/19/14 1749 10/20/14 0126  NA 138 135  K 3.7 3.9  CL 99* 97*  CO2 29 27    GLUCOSE 134* 172*  BUN 15 15  CREATININE 1.06* 1.21*  CALCIUM 9.5 9.1   CBC:  Recent Labs Lab 10/19/14 1749 10/20/14 0126 10/23/14 0430  WBC 10.0 10.0 9.8  NEUTROABS 6.8  --   --   HGB 10.5* 9.5* 8.9*  HCT 34.7* 31.1* 29.4*  MCV 90.1 89.6 89.9  PLT 294 255 242   CBG:  Recent Labs Lab 10/23/14 0749 10/23/14 1211 10/23/14 1706 10/24/14 0032 10/24/14 0759 10/24/14 1221  GLUCAP 84 140* 106* 108* 132* 202*   Coagulation:  Recent Labs Lab 10/20/14 0126 10/21/14 0457 10/22/14 0525 10/23/14 0430  LABPROT 23.9* 25.4* 23.7* 22.0*  INR 2.15* 2.34* 2.13* 1.93*   Urine Drug Screen: Drugs of Abuse     Component Value Date/Time   LABOPIA POSITIVE* 01/07/2013 2114   COCAINSCRNUR NONE DETECTED 01/07/2013 2114   LABBENZ NONE DETECTED 01/07/2013 2114   AMPHETMU NONE DETECTED 01/07/2013 2114   THCU NONE DETECTED 01/07/2013 2114   LABBARB NONE DETECTED 01/07/2013 2114    Studies/Results: No results found. Medications: Scheduled Meds: . antiseptic oral rinse  7 mL Mouth Rinse q12n4p  . buPROPion  100 mg Oral BID  . chlorhexidine  15 mL Mouth Rinse BID  . diclofenac sodium  2 g Topical QID  . fluticasone  2 spray Each Nare Daily  . furosemide  80 mg Oral BID  . gabapentin  300 mg Oral TID  . insulin aspart  0-15 Units Subcutaneous TID WC  . insulin detemir  35 Units Subcutaneous BID  . lidocaine  1 patch Transdermal Q24H  . mometasone-formoterol  2 puff Inhalation BID  . nicotine  14 mg Transdermal Daily  . pantoprazole  40 mg Oral Daily  . potassium chloride SA  20 mEq Oral BID  . rosuvastatin  20 mg Oral QHS  . sertraline  200 mg Oral Daily  . tiotropium  18 mcg Inhalation Daily  . warfarin  2.5 mg Oral Once per day on Sun Tue Wed Thu Fri Sat   And  . warfarin  5 mg Oral Once per day on Mon  . Warfarin - Pharmacist Dosing Inpatient   Does not apply q1800   Continuous Infusions:  PRN Meds:.albuterol, ALPRAZolam, naLOXone (NARCAN)  injection, ondansetron  **OR** ondansetron (ZOFRAN) IV, polyethylene glycol Assessment/Plan: Principal Problem:   Intractable pain Active Problems:   Gastroesophageal reflux disease   Type 2 diabetes mellitus with diabetic neuropathy   Pulmonary emphysema   Osteoporosis   Paroxysmal atrial fibrillation   Tobacco abuse   Rib fractures   Rib pain on right side 64 yo female with CHF, paroxysmal AFib, moderate to severe pulmonary HTN, COPD, and osteoporosis, presenting with 2 week h/o right sided flank and back pain.  Acute Right 6th-8th Displaced Rib Fractures: fell from recliner 4 weeks ago, has osteoporesis. T-spine xray negative. Readmitted for uncontrolled pain.   -she is still experiencing paranoia, delirium.  Her opiates and baclofen have been held now for over 24 hours.  Hopefully her delirium/paranoia will improve as this flushes out of her system. -discontinue Dilaudid PCA 0.35m/mL 6 times/day. -discontinue baclofen -continue Lidoderm patch and Voltaren gel. This was encouraged today in an effort to limit systemic exposure to medications. Other pain options such as tramadol are not good options for her since she is on SSRI's and NSAIDS are also not great options given her a-fib on Warfarin. -can consider restarting her home Vicodin as she improves given that this has been tolerated by her for some time. -incentive spirometry -PT/OT to evaluate and treat.  She was able to ambulate with them today.  They recommend home health PT at d/c  Delirium/Paranoia -holding opiates and baclofen for now.  Pain can also be contributing so will do our best to help control this -will try to encourage curtains up, lights on during the day -will ask for sitter  Paroxysmal Atrial Fibrillation: -Warfarin per pharmacy   COPD/Tobacco Abuse: Patient is on Albuterol, Advair and Spriva at home. Patient counseled that current smoking will delay fracture healing. - Supplemental O2 as needed - Albuterol prn - Dulera bid -  Spiriva daily - Nicotine Patch  T2DM: patient is on Levemir 45 units BID and Novolog 18 units with meals. - SSI-M - Levemir 35 units BID - Carb mod diet - Gabapentin 300 mg TID  Chronic dCHF: Not clinically in CHF exacerbation. Patient is on Lasix 80 mg BID and Kdur 20 mEq BID at home. - Continue Lasix and Kdur  Depression: Patient is on Xanax 0.5-1 mg BID prn, wellbutrin 100 mg BID and zoloft 200 mg daily. - Continue Wellbutrin and Zoloft - Xanax prn  GERD: On omeprazole 40 mg daily at home. - Protonix 40 mg daily  HLD: On rosuvastatin 20 mg daily at home. - Continue rosuvastatin  Dispo: Disposition is deferred at this time, awaiting improvement of current medical problems.     The patient does have a current PCP [name], MD) and does need an [facility] hospital follow-up appointment after discharge.  The patient does not have transportation limitations that hinder transportation to clinic appointments.   LOS: 3 days   Jule Ser, DO 10/24/2014, 2:53 PM

## 2014-10-24 NOTE — Evaluation (Signed)
I stopped by this afternoon to visit Ms. Brosnahan. She remains paranoid, although less so than yesterday. She still has her sense of humor and she showed me some of the Facebook postings she has made about her hospital stay (while in a paranoid state). I appreciate the inpatient team's care of our patient and am hopeful holding the opiates and muscle relaxant will allow continued improvement in her paranoia. I also agree with the planned workup for a possible UTI or interim pneumonia should she not continue to improve or develop signs or symptoms consistent with an infection which could be contributing to her paranoia. I will try to visit tomorrow, time permitting.

## 2014-10-24 NOTE — Progress Notes (Signed)
Patient report need something for pain md paged

## 2014-10-25 ENCOUNTER — Inpatient Hospital Stay (HOSPITAL_COMMUNITY): Payer: PPO

## 2014-10-25 LAB — URINALYSIS, ROUTINE W REFLEX MICROSCOPIC
Bilirubin Urine: NEGATIVE
Glucose, UA: NEGATIVE mg/dL
Hgb urine dipstick: NEGATIVE
Ketones, ur: NEGATIVE mg/dL
Leukocytes, UA: NEGATIVE
Nitrite: NEGATIVE
Protein, ur: NEGATIVE mg/dL
Specific Gravity, Urine: 1.009 (ref 1.005–1.030)
Urobilinogen, UA: 0.2 mg/dL (ref 0.0–1.0)
pH: 6 (ref 5.0–8.0)

## 2014-10-25 LAB — GLUCOSE, CAPILLARY
Glucose-Capillary: 131 mg/dL — ABNORMAL HIGH (ref 65–99)
Glucose-Capillary: 142 mg/dL — ABNORMAL HIGH (ref 65–99)
Glucose-Capillary: 140 mg/dL — ABNORMAL HIGH (ref 65–99)
Glucose-Capillary: 156 mg/dL — ABNORMAL HIGH (ref 65–99)

## 2014-10-25 LAB — PROTIME-INR
INR: 2.76 — ABNORMAL HIGH (ref 0.00–1.49)
Prothrombin Time: 28.7 seconds — ABNORMAL HIGH (ref 11.6–15.2)

## 2014-10-25 LAB — BASIC METABOLIC PANEL
Anion gap: 10 (ref 5–15)
BUN: 20 mg/dL (ref 6–20)
CO2: 32 mmol/L (ref 22–32)
Calcium: 9.1 mg/dL (ref 8.9–10.3)
Chloride: 101 mmol/L (ref 101–111)
Creatinine, Ser: 1.07 mg/dL — ABNORMAL HIGH (ref 0.44–1.00)
GFR calc Af Amer: >60 mL/min (ref >60)
GFR calc non Af Amer: 53 mL/min — ABNORMAL LOW (ref >60)
Glucose, Bld: 127 mg/dL — ABNORMAL HIGH (ref 65–99)
Potassium: 3.8 mmol/L (ref 3.5–5.1)
Sodium: 143 mmol/L (ref 135–145)

## 2014-10-25 LAB — CBC
HCT: 30.7 % — ABNORMAL LOW (ref 36.0–46.0)
Hemoglobin: 9.6 g/dL — ABNORMAL LOW (ref 12.0–15.0)
MCH: 27.9 pg (ref 26.0–34.0)
MCHC: 31.3 g/dL (ref 30.0–36.0)
MCV: 89.2 fL (ref 78.0–100.0)
Platelets: 262 10*3/uL (ref 150–400)
RBC: 3.44 MIL/uL — ABNORMAL LOW (ref 3.87–5.11)
RDW: 16.2 % — ABNORMAL HIGH (ref 11.5–15.5)
WBC: 7.5 10*3/uL (ref 4.0–10.5)

## 2014-10-25 MED ORDER — HYDROCODONE-ACETAMINOPHEN 5-325 MG PO TABS
1.0000 | ORAL_TABLET | Freq: Once | ORAL | Status: AC
  Administered 2014-10-25: 1 via ORAL
  Filled 2014-10-25: qty 1

## 2014-10-25 MED ORDER — HYDROCODONE-ACETAMINOPHEN 5-325 MG PO TABS: 1.0000 | ORAL_TABLET | Freq: Four times a day (QID) | ORAL | Status: DC | PRN

## 2014-10-25 MED ORDER — KETOROLAC TROMETHAMINE 30 MG/ML IJ SOLN
30.0000 mg | Freq: Once | INTRAMUSCULAR | Status: AC
  Administered 2014-10-25: 30 mg via INTRAVENOUS
  Filled 2014-10-25: qty 1

## 2014-10-25 NOTE — Progress Notes (Signed)
Physical Therapy Treatment Patient Details Name: [name] MRN: [id number] DOB: Jun 16, 1950 Today's Date: 10/25/2014    History of Present Illness 64 y.o. female with a PMH of COPD, pulmonary HTN, tobacco abuse, CHF, osteoporosis who presents to the ED with right sided rib pain and back pain. She was recently admitted for right 6th and 8th displaced rib fractures; she was discharged 09/05. Reports worsening pain since the time of her discharge, and states the pain medication she was given has not been effective in controlling her pain. States her symptoms are constant. Movement exacerbates her pain; her prescribed pain medication has given her minimal relief. She denies fever, chills, lightheadedness, syncope. Reports intermittent dizziness. Denies chest pain, shortness of breath, abdominal pain, N/V/D/C, dysuria, urgency, frequency. Reports cough, unchanged from baseline.    PT Comments    Pt progressing well as indicated by increased activity tolerance and improvements with ambulation and dynamic balance.  She still reports 10/10 pain but did not have visible signs/symptoms and pain did not appear to be limit her mobility.  Continue to progress PT POC as tolerated.  Follow Up Recommendations  Home health PT;Supervision for mobility/OOB     Equipment Recommendations  None recommended by PT    Recommendations for Other Services       Precautions / Restrictions Precautions Precautions: Fall Restrictions Weight Bearing Restrictions: No    Mobility  Bed Mobility               General bed mobility comments: pt received sitting in chair  Transfers Overall transfer level: Needs assistance Equipment used: None Transfers: Sit to/from Stand Sit to Stand: Supervision            Ambulation/Gait Ambulation/Gait assistance: Min guard Ambulation Distance (Feet): 140 Feet Assistive device: None Gait Pattern/deviations: Step-through pattern;Drifts right/left Gait  velocity: slower; able to increase some with cueing Gait velocity interpretation: Below normal speed for age/gender General Gait Details: Min guard due to some unsteadiness with gait, especially with increased fatigue   Stairs            Wheelchair Mobility    Modified Rankin (Stroke Patients Only)       Balance   Sitting-balance support: Feet supported;No upper extremity supported Sitting balance-Leahy Scale: Good     Standing balance support: During functional activity Standing balance-Leahy Scale: Good                      Cognition Arousal/Alertness: Awake/alert Behavior During Therapy: WFL for tasks assessed/performed Overall Cognitive Status: Impaired/Different from baseline Area of Impairment: Safety/judgement         Safety/Judgement: Decreased awareness of safety          Exercises      General Comments        Pertinent Vitals/Pain Pain Assessment: 0-10 Pain Score: 10-Worst pain ever (reports 10/10 but no grimacing/guarding/visible signs) Pain Location: ribs Pain Intervention(s): Monitored during session    Home Living                      Prior Function            PT Goals (current goals can now be found in the care plan section) Acute Rehab PT Goals PT Goal Formulation: With patient Time For Goal Achievement: 11/03/14 Potential to Achieve Goals: Good Progress towards PT goals: Progressing toward goals    Frequency  Min 3X/week    PT Plan Current plan remains appropriate  Co-evaluation             End of Session Equipment Utilized During Treatment: Gait belt Activity Tolerance: Patient tolerated treatment well Patient left: in chair;with chair alarm set;with call bell/phone within reach     Time: 4010-2725 PT Time Calculation (min) (ACUTE ONLY): 10 min  Charges:  $Gait Training: 8-22 mins                    G Codes:      Drusilla Wampole 10-Nov-2014, 5:03 PM  Arnoldo Morale, SPT

## 2014-10-25 NOTE — Progress Notes (Signed)
Patient seen and examined. Case d/w residents in detail. I agree with findings and plan as documented in Dr. Alcario Drought note.  Patient's mental status is much improved today. Delirium likely secondary to pain meds. CXR with no evidence of PNA. U/a is negative. No further infectious work up for now. Resident discussed with anesthesia about possible nerve block for pain control but INR is therapeutic. C/w lidoderm patch and voltaren gel for paincontrol . Would consider vicodin for pain control as she has been on this for a long time with no delirium.

## 2014-10-25 NOTE — Progress Notes (Signed)
ANTICOAGULATION CONSULT NOTE - [place] for Coumadin Indication: atrial fibrillation  Allergies  Allergen Reactions  . Lorazepam     Patient's sister noted that ativan caused the patient to become extremely confused during hospitalization 09/2010  . Morphine And Related Other (See Comments)  . Oxycontin [Oxycodone] Other (See Comments)    headache  . Tramadol Hcl     REACTION: swelling    Patient Measurements: Height: _0  (157.5 cm) Weight: 184 lb 8.4 oz (83.7 kg) IBW/kg (Calculated) : 50.1  Vital Signs: Temp: 98.2 F (36.8 C) (09/14 0457) Temp Source: Oral (09/14 0457) BP: 150/50 mmHg (09/14 0457) Pulse Rate: 70 (09/14 1015)  Labs:  Recent Labs  10/23/14 0430 10/25/14 0524 10/25/14 0730  HGB 8.9*  --  9.6*  HCT 29.4*  --  30.7*  PLT 242  --  262  LABPROT 22.0* 28.7*  --   INR 1.93* 2.76*  --   CREATININE  --  1.07*  --     Estimated Creatinine Clearance: 52.5 mL/min (by C-G formula based on Cr of 1.07).  Assessment: 64 yo female admitted for 2 week hx of r-sided flank and back pain. Recent rib fractures. Rib fx in 6/7/8  PMH: CHF, AFib, Pulm HTN, COPD, osteoporosis, T2DM  On warfarin PTA for Afib. INR is therapeutic on home regimen. Concern for increase in INR, possible related to decreased appetite.  Will follow.  Goal of Therapy:  INR 2-3   Plan:  Continue home regimen of 2.5 mg daily, except 5 mg on Mon Monitor daily INR, CBC, s/s of bleed  [employer], Pharm.D., BCPS Clinical Pharmacist Pager [phone] 10/25/2014 1:14 PM

## 2014-10-25 NOTE — Progress Notes (Addendum)
Subjective: Patient seen this morning on rounds.  Her mental status is improved from the previous two days.  She is less paranoid and appears in better spirits.  She is lying in bed still complaining of rib pain.  Has not received her Voltaren gel as of when we saw her.  Still working on getting her moved to [facility], just awaiting room availability.   Objective: Vital signs in last 24 hours: Filed Vitals:   10/24/14 1433 10/24/14 2206 10/25/14 0457 10/25/14 1015  BP: 144/53 153/52 150/50   Pulse: 82 80 81 70  Temp: 98 F (36.7 C) 98.6 F (37 C) 98.2 F (36.8 C)   TempSrc: Oral Oral Oral   Resp: _0 Height:      Weight:      SpO2: 96% 96% 95% 98%   Weight change:   Intake/Output Summary (Last 24 hours) at 10/25/14 1355 Last data filed at 10/25/14 1315  Gross per 24 hour  Intake    720 ml  Output   2300 ml  Net  -1580 ml   General: lying in bed, uncomfortable appearing when moving, mental status improved HEENT: EOMI, no scleral icterus Cardiac: RRR, no rubs, murmurs or gallops. Pulm: clear to auscultation bilaterally, diminished movement of air, tolerating incentive spirometer.  Able to use with [place] in the room. Abd: soft, nontender, nondistended, BS present MSK: palpation of her right ribcage area elicits response of grimacing and tensing of body Ext: warm and well perfused, no pedal edema  Lab Results: Basic Metabolic Panel:  Recent Labs Lab 10/20/14 0126 10/25/14 0524  NA 135 143  K 3.9 3.8  CL 97* 101  CO2 27 32  GLUCOSE 172* 127*  BUN 15 20  CREATININE 1.21* 1.07*  CALCIUM 9.1 9.1   CBC:  Recent Labs Lab 10/19/14 1749  10/23/14 0430 10/25/14 0730  WBC 10.0  < > 9.8 7.5  NEUTROABS 6.8  --   --   --   HGB 10.5*  < > 8.9* 9.6*  HCT 34.7*  < > 29.4* 30.7*  MCV 90.1  < > 89.9 89.2  PLT 294  < > 242 262  < > = values in this interval not displayed. CBG:  Recent Labs Lab 10/24/14 0759 10/24/14 1221 10/24/14 1656 10/24/14 2210  10/25/14 0748 10/25/14 1213  GLUCAP 132* 202* 90 137* 131* 142*   Coagulation:  Recent Labs Lab 10/21/14 0457 10/22/14 0525 10/23/14 0430 10/25/14 0524  LABPROT 25.4* 23.7* 22.0* 28.7*  INR 2.34* 2.13* 1.93* 2.76*   Urine Drug Screen: Drugs of Abuse     Component Value Date/Time   LABOPIA POSITIVE* 01/07/2013 2114   COCAINSCRNUR NONE DETECTED 01/07/2013 2114   LABBENZ NONE DETECTED 01/07/2013 2114   AMPHETMU NONE DETECTED 01/07/2013 2114   THCU NONE DETECTED 01/07/2013 2114   LABBARB NONE DETECTED 01/07/2013 2114    Studies/Results: Dg Chest 2 View  10/25/2014   CLINICAL DATA:  Shortness of breath, COPD  EXAM: CHEST  2 VIEW  COMPARISON:  10/19/2014  FINDINGS: Cardiomediastinal silhouette is stable. Status post median sternotomy. Right rib fractures are poorly visualized. Postoperative changes with metallic fixation plate and screws in right clavicle. No acute infiltrate or pulmonary edema. Thoracic spine osteopenia.  IMPRESSION: Status post median sternotomy. Right rib fractures are poorly visualized. Postoperative changes with metallic fixation plate and screws in right clavicle. No acute infiltrate or pulmonary edema. Thoracic spine osteopenia.   Electronically Signed   By: Julien Girt  Pop M.D.   On: 10/25/2014 08:12   Medications: Scheduled Meds: . antiseptic oral rinse  7 mL Mouth Rinse q12n4p  . buPROPion  100 mg Oral BID  . chlorhexidine  15 mL Mouth Rinse BID  . diclofenac sodium  2 g Topical QID  . fluticasone  2 spray Each Nare Daily  . furosemide  80 mg Oral BID  . gabapentin  300 mg Oral TID  . insulin aspart  0-15 Units Subcutaneous TID WC  . insulin detemir  35 Units Subcutaneous BID  . lidocaine  1 patch Transdermal Q24H  . mometasone-formoterol  2 puff Inhalation BID  . nicotine  14 mg Transdermal Daily  . pantoprazole  40 mg Oral Daily  . potassium chloride SA  20 mEq Oral BID  . rosuvastatin  20 mg Oral QHS  . sertraline  200 mg Oral Daily  . tiotropium   18 mcg Inhalation Daily  . warfarin  2.5 mg Oral Once per day on Sun Tue Wed Thu Fri Sat   And  . warfarin  5 mg Oral Once per day on Mon  . Warfarin - Pharmacist Dosing Inpatient   Does not apply q1800   Continuous Infusions:  PRN Meds:.albuterol, ALPRAZolam, naLOXone (NARCAN)  injection, ondansetron **OR** ondansetron (ZOFRAN) IV, polyethylene glycol Assessment/Plan: Principal Problem:   Intractable pain Active Problems:   Gastroesophageal reflux disease   Type 2 diabetes mellitus with diabetic neuropathy   Pulmonary emphysema   Osteoporosis   Paroxysmal atrial fibrillation   Tobacco abuse   Rib fractures   Rib pain on right side 64 yo female with CHF, paroxysmal AFib, moderate to severe pulmonary HTN, COPD, and osteoporosis, presenting with 2 week h/o right sided flank and back pain.  Acute Right 6th-8th Displaced Rib Fractures: fell from recliner 4 weeks ago, has osteoporesis. T-spine xray negative. Readmitted for uncontrolled pain.   -Her opiates and baclofen have been held now for over 48 hours.  She is improved from this standpoint -discontinue Dilaudid PCA 0.2m/mL 6 times/day. -discontinue baclofen -continue Lidoderm patch and Voltaren gel.  Still hopeful that we can obtain adequate pain control with local treatment modalities versus systemic options. Other pain options such as tramadol are not good options for her since she is on SSRI's -PT/OT to evaluate and treat.  She was able to ambulate with them today.  They recommend home health PT at d/c -will consult with anesthesiology to see about providing a nerve block to this area to provide her pain relief.  If this is not an option can also consider Toradol. -after speaking with anesthesiology, intercostal block would only last about 12 hours.  Paravertebral block could be done, patients INR would have to come down first and then would need to be inpatient for 2-3 days post-procedure.   Delirium/Paranoia -holding opiates and  baclofen for now.  Pain can also be contributing so will do our best to help control this -WBC normal, CXR read as having poorly visualized right rib fractures, no acute infiltrate or pulmonary edema, thoracic spine osteopenia -urinalysis with negative leukocytes, nitrites -improving -informed by RN that likely does not meet criteria for sitter -[facility] Paroxysmal Atrial Fibrillation: -Warfarin per pharmacy   COPD/Tobacco Abuse: Patient is on Albuterol, Advair and Spriva at home. Patient counseled that current smoking will delay fracture healing. - Supplemental O2 as needed - Albuterol prn - Dulera bid - Spiriva daily - Nicotine Patch  T2DM: patient is on Levemir 45  units BID and Novolog 18 units with meals. - SSI-M - Levemir 35 units BID - Carb mod diet - Gabapentin 300 mg TID  Chronic dCHF: Not clinically in CHF exacerbation. Patient is on Lasix 80 mg BID and Kdur 20 mEq BID at home. - Continue Lasix and Kdur  Depression: Patient is on Xanax 0.5-1 mg BID prn, wellbutrin 100 mg BID and zoloft 200 mg daily. - Continue Wellbutrin and Zoloft - Xanax prn  GERD: On omeprazole 40 mg daily at home. - Protonix 40 mg daily  HLD: On rosuvastatin 20 mg daily at home. - Continue rosuvastatin  Dispo: Disposition is deferred at this time, awaiting improvement of current medical problems.     The patient does have a current PCP [name], MD) and does need an [facility] hospital follow-up appointment after discharge.  The patient does not have transportation limitations that hinder transportation to clinic appointments.   LOS: 4 days   Jule Ser, DO 10/25/2014, 1:55 PM

## 2014-10-25 NOTE — Care Management Note (Addendum)
Case Management Note  Patient Details  Name: [name] MRN: [id number] Date of Birth: [birth year]  Subjective/Objective:    Patient is better with the confusion today,  She wants to be transferred to the [facility] per MD notes, but per MD patient will probably remain here and go home tomorrow.  Per pt eval recs hhpt, she is set up with [facility] .  Will need HHPT order.  NCM will cont to follow for dc needs.                Action/Plan:   Expected Discharge Date:                  Expected Discharge Plan:  [place]  In-House Referral:     Discharge planning Services  CM Consult  Post Acute Care Choice:  Home Health Choice offered to:     DME Arranged:  N/A DME Agency:  NA  HH Arranged:  PT [place] Agency:  [place]  Status of Service:  In process, will continue to follow  Medicare Important Message Given:  Yes-second notification given Date Medicare IM Given:    Medicare IM give by:    Date Additional Medicare IM Given:    Additional Medicare Important Message give by:     If discussed at [place] of Stay Meetings, dates discussed:    Additional Comments:  Zenon Mayo, RN 10/25/2014, 3:44 PM

## 2014-10-26 LAB — CBC
HCT: 31.4 % — ABNORMAL LOW (ref 36.0–46.0)
Hemoglobin: 9.4 g/dL — ABNORMAL LOW (ref 12.0–15.0)
MCH: 26.7 pg (ref 26.0–34.0)
MCHC: 29.9 g/dL — ABNORMAL LOW (ref 30.0–36.0)
MCV: 89.2 fL (ref 78.0–100.0)
Platelets: 255 10*3/uL (ref 150–400)
RBC: 3.52 MIL/uL — ABNORMAL LOW (ref 3.87–5.11)
RDW: 16.4 % — ABNORMAL HIGH (ref 11.5–15.5)
WBC: 6.6 10*3/uL (ref 4.0–10.5)

## 2014-10-26 LAB — GLUCOSE, CAPILLARY: Glucose-Capillary: 151 mg/dL — ABNORMAL HIGH (ref 65–99)

## 2014-10-26 MED ORDER — DICLOFENAC SODIUM 1 % TD GEL: 2.0000 g | Freq: Four times a day (QID) | TRANSDERMAL | Status: DC

## 2014-10-26 NOTE — Progress Notes (Signed)
Reviewed discharge paperwork with pt.  Pt denied any needs at this time.  PIV removed.  Pt taken to discharge location via wheelchair.

## 2014-10-26 NOTE — Progress Notes (Signed)
Patient seen and examined. Case d/w residents in detail. I agree with findings and plan as documented in Dr. Alcario Drought note.   Patient is much improved. Delirium has resolved and pain is better controlled. Will c/w short course of ibuprofen prn pain and c/w home vicodin prn. Will d/c home today with outpatient f/u with [facility]

## 2014-10-26 NOTE — Progress Notes (Signed)
Subjective: Patient seen this morning and is in good spirits.  No new complaints, pain seems to be doing well.  She was given a dose of her home Vicodin last night to make certain her mental status did not alter and she tolerated this well.  Will have patient follow up in the clinic next week.   Objective: Vital signs in last 24 hours: Filed Vitals:   10/25/14 1409 10/25/14 2057 10/26/14 0550 10/26/14 0900  BP: 119/61 125/40 122/55   Pulse: 78 75 79 72  Temp: 98.4 F (36.9 C) 98.4 F (36.9 C) 98.3 F (36.8 C)   TempSrc: Oral Oral Oral   Resp: _0 Height:      Weight:      SpO2: 97% 92% 98% 94%   Weight change:   Intake/Output Summary (Last 24 hours) at 10/26/14 1121 Last data filed at 10/26/14 0900  Gross per 24 hour  Intake    360 ml  Output   2250 ml  Net  -1890 ml   General: sitting up in chair, talking, laughing HEENT: EOMI, no scleral icterus Cardiac: RRR, no rubs, murmurs or gallops. Pulm: clear to auscultation bilaterally Abd: soft, nontender, nondistended, BS present Ext: warm and well perfused, no pedal edema  Lab Results: Basic Metabolic Panel:  Recent Labs Lab 10/20/14 0126 10/25/14 0524  NA 135 143  K 3.9 3.8  CL 97* 101  CO2 27 32  GLUCOSE 172* 127*  BUN 15 20  CREATININE 1.21* 1.07*  CALCIUM 9.1 9.1   CBC:  Recent Labs Lab 10/19/14 1749  10/25/14 0730 10/26/14 0535  WBC 10.0  < > 7.5 6.6  NEUTROABS 6.8  --   --   --   HGB 10.5*  < > 9.6* 9.4*  HCT 34.7*  < > 30.7* 31.4*  MCV 90.1  < > 89.2 89.2  PLT 294  < > 262 255  < > = values in this interval not displayed. CBG:  Recent Labs Lab 10/24/14 2210 10/25/14 0748 10/25/14 1213 10/25/14 1702 10/25/14 2055 10/26/14 0743  GLUCAP 137* 131* 142* 140* 156* 151*   Coagulation:  Recent Labs Lab 10/21/14 0457 10/22/14 0525 10/23/14 0430 10/25/14 0524  LABPROT 25.4* 23.7* 22.0* 28.7*  INR 2.34* 2.13* 1.93* 2.76*   Urine Drug Screen: Drugs of Abuse     Component  Value Date/Time   LABOPIA POSITIVE* 01/07/2013 2114   COCAINSCRNUR NONE DETECTED 01/07/2013 2114   LABBENZ NONE DETECTED 01/07/2013 2114   AMPHETMU NONE DETECTED 01/07/2013 2114   THCU NONE DETECTED 01/07/2013 2114   LABBARB NONE DETECTED 01/07/2013 2114    Studies/Results: Dg Chest 2 View  10/25/2014   CLINICAL DATA:  Shortness of breath, COPD  EXAM: CHEST  2 VIEW  COMPARISON:  10/19/2014  FINDINGS: Cardiomediastinal silhouette is stable. Status post median sternotomy. Right rib fractures are poorly visualized. Postoperative changes with metallic fixation plate and screws in right clavicle. No acute infiltrate or pulmonary edema. Thoracic spine osteopenia.  IMPRESSION: Status post median sternotomy. Right rib fractures are poorly visualized. Postoperative changes with metallic fixation plate and screws in right clavicle. No acute infiltrate or pulmonary edema. Thoracic spine osteopenia.   Electronically Signed   By: Lahoma Crocker M.D.   On: 10/25/2014 08:12   Medications: Scheduled Meds: . antiseptic oral rinse  7 mL Mouth Rinse q12n4p  . buPROPion  100 mg Oral BID  . chlorhexidine  15 mL Mouth Rinse BID  . diclofenac sodium  2 g Topical QID  . fluticasone  2 spray Each Nare Daily  . furosemide  80 mg Oral BID  . gabapentin  300 mg Oral TID  . insulin aspart  0-15 Units Subcutaneous TID WC  . insulin detemir  35 Units Subcutaneous BID  . lidocaine  1 patch Transdermal Q24H  . mometasone-formoterol  2 puff Inhalation BID  . nicotine  14 mg Transdermal Daily  . pantoprazole  40 mg Oral Daily  . potassium chloride SA  20 mEq Oral BID  . rosuvastatin  20 mg Oral QHS  . sertraline  200 mg Oral Daily  . tiotropium  18 mcg Inhalation Daily  . warfarin  2.5 mg Oral Once per day on Sun Tue Wed Thu Fri Sat   And  . warfarin  5 mg Oral Once per day on Mon  . Warfarin - Pharmacist Dosing Inpatient   Does not apply q1800   Continuous Infusions:  PRN Meds:.albuterol, ALPRAZolam,  HYDROcodone-acetaminophen, naLOXone (NARCAN)  injection, ondansetron **OR** ondansetron (ZOFRAN) IV, polyethylene glycol Assessment/Plan: Principal Problem:   Intractable pain Active Problems:   Gastroesophageal reflux disease   Type 2 diabetes mellitus with diabetic neuropathy   Pulmonary emphysema   Osteoporosis   Paroxysmal atrial fibrillation   Tobacco abuse   Rib fractures   Rib pain on right side 64 yo female with CHF, paroxysmal AFib, moderate to severe pulmonary HTN, COPD, and osteoporosis, presenting with 2 week h/o right sided flank and back pain.  Acute Right 6th-8th Displaced Rib Fractures: fell from recliner 4 weeks ago, has osteoporesis. T-spine xray negative. Readmitted for uncontrolled pain.   -improving and pain seems to currently be well controlled.  -will d/c to home with prescription for Voltaren gel and instruction to use ibuprofen and Vicodin as needed for breakthrough pain.  She does not need prescriptions for ibuprofen or Vicodin.  Delirium/Paranoia -improved. -WBC normal, CXR read as having poorly visualized right rib fractures, no acute infiltrate or pulmonary edema, thoracic spine osteopenia. -urinalysis with negative leukocytes, nitrites.  Paroxysmal Atrial Fibrillation: -Warfarin per pharmacy   COPD/Tobacco Abuse: Patient is on Albuterol, Advair and Spriva at home. Patient counseled that current smoking will delay fracture healing. - Supplemental O2 as needed - Albuterol prn - Dulera bid - Spiriva daily - Nicotine Patch  T2DM: patient is on Levemir 45 units BID and Novolog 18 units with meals. - SSI-M - Levemir 35 units BID - Carb mod diet - Gabapentin 300 mg TID  Chronic dCHF: Not clinically in CHF exacerbation. Patient is on Lasix 80 mg BID and Kdur 20 mEq BID at home. - Continue Lasix and Kdur  Depression: Patient is on Xanax 0.5-1 mg BID prn, wellbutrin 100 mg BID and zoloft 200 mg daily. - Continue Wellbutrin and Zoloft - Xanax  prn  GERD: On omeprazole 40 mg daily at home. - Protonix 40 mg daily  HLD: On rosuvastatin 20 mg daily at home. - Continue rosuvastatin  Dispo: Discharge home today with clinic follow up appointment scheduled for next week.  The patient does have a current PCP [name], MD) and does need an [facility] hospital follow-up appointment after discharge.  The patient does not have transportation limitations that hinder transportation to clinic appointments.   LOS: 5 days   Jule Ser, DO 10/26/2014, 11:21 AM

## 2014-10-26 NOTE — Discharge Summary (Signed)
Name: [name] MRN: [id number] DOB: 12/18/1950 64 y.o. PCP: [name], MD  Date of Admission: 10/19/2014  4:51 PM Date of Discharge: 10/29/2014 Attending Physician: No att. providers found  Discharge Diagnosis: 1. Rib fracture 2. Hospital delirium   Principal Problem:   Intractable pain Active Problems:   Gastroesophageal reflux disease   Type 2 diabetes mellitus with diabetic neuropathy   Pulmonary emphysema   Osteoporosis   Paroxysmal atrial fibrillation   Tobacco abuse   Rib fractures   Rib pain on right side  Discharge Medications:   Medication List    STOP taking these medications        cyclobenzaprine 5 MG tablet  Commonly known as:  FLEXERIL     HYDROmorphone 2 MG tablet  Commonly known as:  DILAUDID      TAKE these medications        ACCU-CHEK FASTCLIX LANCET Kit  Check your blood 4 times a day dx code 250.00 insulin requiring     albuterol 108 (90 BASE) MCG/ACT inhaler  Commonly known as:  PROAIR HFA  Inhale 2 puffs into the lungs every 6 (six) hours as needed for wheezing or shortness of breath.     ALPRAZolam 1 MG tablet  Commonly known as:  XANAX  Take 0.5-1 tablets (0.5-1 mg total) by mouth 2 (two) times daily as needed for anxiety.     buPROPion 100 MG tablet  Commonly known as:  WELLBUTRIN  Take 1 tablet (100 mg total) by mouth 2 (two) times daily.     Calcium Carb-Cholecalciferol 600-800 MG-UNIT Tabs  Take 1 tablet by mouth 2 (two) times daily.     chlorpheniramine 4 MG tablet  Commonly known as:  CHLOR-TRIMETON  Take 1 tablet (4 mg total) by mouth 3 (three) times daily as needed for allergies.     diclofenac sodium 1 % Gel  Commonly known as:  VOLTAREN  Apply 2 g topically 4 (four) times daily.     EASY TOUCH PEN NEEDLES 31G X 5 MM Misc  Generic drug:  Insulin Pen Needle  USE TO INJECT INSULIN 5 TIMES DAILY     fluticasone 50 MCG/ACT nasal spray  Commonly known as:  FLONASE  Place 2 sprays into both nostrils daily.       Fluticasone-Salmeterol 250-50 MCG/DOSE Aepb  Commonly known as:  ADVAIR DISKUS  Inhale 1 puff into the lungs 2 (two) times daily.     furosemide 80 MG tablet  Commonly known as:  LASIX  Take 1 tablet (80 mg total) by mouth 2 (two) times daily.     gabapentin 300 MG capsule  Commonly known as:  NEURONTIN  Take 1 capsule (300 mg total) by mouth 3 (three) times daily.     HYDROcodone-acetaminophen 5-325 MG per tablet  Commonly known as:  NORCO/VICODIN  Take 1-2 tablets by mouth every 6 (six) hours as needed for severe pain.     ibuprofen 200 MG tablet  Commonly known as:  ADVIL,MOTRIN  Take 400 mg by mouth every 6 (six) hours as needed for moderate pain.     insulin aspart cartridge  Commonly known as:  NOVOLOG  Inject 18 Units into the skin 3 (three) times daily with meals.     Insulin Detemir 100 UNIT/ML Pen  Commonly known as:  LEVEMIR  Inject 45 Units into the skin 2 (two) times daily.     lidocaine 2 % jelly  Commonly known as:  XYLOCAINE  APPLY TO AFFECTED  AREA THREE TIMES A DAY     nystatin cream  Commonly known as:  MYCOSTATIN  Apply 1 application topically 2 (two) times daily.     omeprazole 40 MG capsule  Commonly known as:  PRILOSEC  Take 1 capsule (40 mg total) by mouth 2 (two) times daily.     ONETOUCH VERIO test strip  Generic drug:  glucose blood  Check blood sugar up to 4 times a day     ONETOUCH VERIO W/DEVICE Kit  1 each by Does not apply route 4 (four) times daily.     potassium chloride SA 20 MEQ tablet  Commonly known as:  K-DUR,KLOR-CON  Take 1 tablet (20 mEq total) by mouth 2 (two) times daily.     rosuvastatin 20 MG tablet  Commonly known as:  CRESTOR  Take 1 tablet (20 mg total) by mouth at bedtime.     sertraline 100 MG tablet  Commonly known as:  ZOLOFT  Take 2 tablets (200 mg total) by mouth daily.     tiotropium 18 MCG inhalation capsule  Commonly known as:  SPIRIVA HANDIHALER  Place 1 capsule (18 mcg total) into inhaler and  inhale daily.     warfarin 5 MG tablet  Commonly known as:  COUMADIN  Take 1/2 tablet (2.5 mg) every day except on Mondays take 1 tablet (5 mg)        Disposition and follow-up:   [name] was discharged from [facility] in Stable condition.  At the hospital follow up visit please address:  1.  Pain control from rib fractures.  She was discharged with prescription for Voltaren gel and instruction to use ibuprofen and home Vicodin as needed for breakthrough pain.  At discharge she declined wanting to have home health PT services despite being recommended.  Please check that this is still the case.  Also address her receptiveness to initiating bisphosphonate therapy for her osteoporosis.  She has been resistant to this in the past, but perhaps will be more open to the idea after her 2 hospitalizations.  2.  Labs / imaging needed at time of follow-up: none  3.  Pending labs/ test needing follow-up: none  Follow-up Appointments:     Follow-up Information    Follow up with Osa Craver, MD On 11/02/2014.   Specialty:  Internal Medicine   Why:  Appointment time is 9:15am   Contact information:   [place] [address] [phone]       Discharge Instructions: Discharge Instructions    Increase activity slowly    Complete by:  As directed            Consultations:    Procedures Performed:  Dg Chest 2 View  10/25/2014   CLINICAL DATA:  Shortness of breath, COPD  EXAM: CHEST  2 VIEW  COMPARISON:  10/19/2014  FINDINGS: Cardiomediastinal silhouette is stable. Status post median sternotomy. Right rib fractures are poorly visualized. Postoperative changes with metallic fixation plate and screws in right clavicle. No acute infiltrate or pulmonary edema. Thoracic spine osteopenia.  IMPRESSION: Status post median sternotomy. Right rib fractures are poorly visualized. Postoperative changes with metallic fixation plate and screws in right  clavicle. No acute infiltrate or pulmonary edema. Thoracic spine osteopenia.   Electronically Signed   By: Lahoma Crocker M.D.   On: 10/25/2014 08:12   Dg Chest 2 View  10/19/2014   CLINICAL DATA:  Right flank pain for 3 weeks. Eighth rib fracture  diagnosed on x-ray 10/13/2014. History of diabetes and smoking. History of AFib and MVR.  EXAM: CHEST  2 VIEW  COMPARISON:  Chest CT and chest x-ray on 10/13/2014  FINDINGS: Heart is mildly enlarged. Lungs are clear. Patient has had previous median sternotomy and right clavicle ORIF. Deformity of the right posterior lateral sixth, seventh, and eighth ribs consistent with subacute fractures. No pneumothorax.  IMPRESSION: Cardiomegaly.  Subacute right rib fractures.   Electronically Signed   By: Nolon Nations M.D.   On: 10/19/2014 18:22   Dg Ribs Unilateral W/chest Right  10/13/2014   CLINICAL DATA:  Status post fall, with right flank pain. Initial encounter.  EXAM: RIGHT RIBS AND CHEST - 3+ VIEW  COMPARISON:  Chest and right rib radiographs performed 10/11/2014  FINDINGS: There is a minimally displaced fracture of the right eighth posterolateral rib.  The lungs are well-aerated. Mild vascular congestion is noted. There is no evidence of focal opacification, pleural effusion or pneumothorax.  The cardiomediastinal silhouette is borderline normal in size. The patient is status post median sternotomy. No acute osseous abnormalities are seen. A plate and screws are noted along the right clavicle.  IMPRESSION: 1. Minimally displaced fracture of the right eighth posterolateral rib. 2. Mild vascular congestion noted.  Lungs remain grossly clear.   Electronically Signed   By: Garald Balding M.D.   On: 10/13/2014 21:33   Dg Ribs Unilateral W/chest Right  10/11/2014   CLINICAL DATA:  Fall 3 weeks ago. Worsening right rib pain and shortness of breath. Initial encounter.  EXAM: RIGHT RIBS AND CHEST - 3+ VIEW  COMPARISON:  05/10/2014  FINDINGS: No fracture or other bone lesions  are seen involving the ribs. There is no evidence of pneumothorax or pleural effusion.  Both lungs are clear. Heart size and mediastinal contours are within normal limits. Patient has undergone previous median sternotomy. Fixation plate and screws again seen in the right clavicle.  IMPRESSION: No acute right rib fractures identified. No active cardiopulmonary disease.   Electronically Signed   By: Earle Gell M.D.   On: 10/11/2014 17:51   Dg Thoracic Spine 4v  10/20/2014   CLINICAL DATA:  Back pain after fall 4 weeks ago  EXAM: THORACIC SPINE - 4+ VIEW  COMPARISON:  None.  FINDINGS: Calcification of the aorta. Normal alignment. Diffuse osteopenia. Mild degenerative disc disease at every level through the thoracic spine. No fracture.  IMPRESSION: No acute findings   Electronically Signed   By: Skipper Cliche M.D.   On: 10/20/2014 13:27   Ct Angio Chest Pe W/cm &/or Wo Cm  10/13/2014   CLINICAL DATA:  Chest pain and cough.  EXAM: CT ANGIOGRAPHY CHEST WITH CONTRAST  TECHNIQUE: Multidetector CT imaging of the chest was performed using the standard protocol during bolus administration of intravenous contrast. Multiplanar CT image reconstructions and MIPs were obtained to evaluate the vascular anatomy.  CONTRAST:  133m OMNIPAQUE IOHEXOL 350 MG/ML SOLN  COMPARISON:  09/16/2010  FINDINGS: Cardiovascular: There is good opacification of the pulmonary arteries. There is no pulmonary embolism. The thoracic aorta is normal in caliber and intact.  Lungs: Centrilobular emphysematous changes are present, upper lobe predominant. Minimal interstitial coarsening in the lateral periphery of the inferior lower lobes, without confluent airspace consolidation.  Central airways: Patent  Effusions: None  Lymphadenopathy: Small nodes in the mediastinum and both hila, substantially decreased from 09/16/2010 and nonspecific.  Esophagus: Unremarkable  Upper abdomen: Unremarkable  Musculoskeletal: There are mildly displaced fractures of  the right  sixth and eighth ribs posteriorly and posterolaterally. No significant musculoskeletal lesion is evident.  Review of the MIP images confirms the above findings.  IMPRESSION: 1. Negative for pulmonary embolism 2. Emphysematous changes.  Mild peripheral interstitial coarsening. 3. Acute mildly displaced right sixth and right eighth rib fractures.   Electronically Signed   By: Andreas Newport M.D.   On: 10/13/2014 23:19    2D Echo: none 2  Cardiac Cath: none  Admission HPI: Ms. Omura is a 64 yo female with CHF, paroxysmal AFib, moderate to severe pulmonary HTN, COPD, and osteoporosis, presenting with 2 week h/o right sided flank and back pain. Patient was recently admitted 9/3-9/5 for right 6th and 8th rib fractures. She was discharged home with Dilaudid 1 mg PO q4hours and Hydrocodone-Acetaminophen 5-325 1-2 tabs q6hours PRN pain. Patient reports taking her Dilaudid as scheduled, and requiring 6-8 Norco tabs per day. She states that there was no change in quality or severity of pain after taking her medications. She denies relief with her Flexeril 5 mg TID.   She endorses mild dyspnea, right sided chest pain that started 2 weeks ago, occasional fever and chills, occasional nausea, urinary frequency, and intermittent cough productive of white sputum. She denies vomiting, abdominal pain, or dysuria. She denies trauma and endorses feeling safe in her home.  Of note, she was initially evaluated in clinic with complaints of right sided chest pain. CXR was initially deferred. Subsequent CXR was negative for fracture (8/31). Repeat CXR (9/2) demonstrated 8th rib fracture. Admission CT 9/2 demonstrated 6th and 8th rib fractures. CXR this evening demonstrates 6th, 7th, and 8th fractures.   She has a h/o rib fractures 2/2 osteoporosis. DEXA scan in March 2016 revealed L1-L4 T-score of -2.7. Bilateral hip densities were normal.  Hospital Course by problem list: Principal Problem:    Intractable pain Active Problems:   Gastroesophageal reflux disease   Type 2 diabetes mellitus with diabetic neuropathy   Pulmonary emphysema   Osteoporosis   Paroxysmal atrial fibrillation   Tobacco abuse   Rib fractures   Rib pain on right side   1. Acute right 6th and 8th rib fractures: patient has a history of fall from her recliner 4 weeks prior to admission with development of pain a couple weeks later.  She has a history of prior fracture 2/2 osteoporosis.  She was readmitted this time a few days after discharge for uncontrolled pain after failing outpatient treatment with dilaudid and flexeril.  She had also been taking her home Norco in addition to the above to attempt to control her pain.  On admission, she was continued on dilaudid 0.50m IV q3h prn and lidocaine patch.  We stopped Norco, Naproxen, Flexeril.  We also started Voltaren gel and Baclofen.  An xray of her t-spine was also obtained and was negative.  PT evaluated her and recommended home health.  On 9/10, due to continued inability to adequately control her pain from her rib fractures, she was started on dilaudid PCA in an effort to determine how much pain medicine was needed to reach adequate pain control.  Baclofen was also stopped this day. 9/11, Baclofen was restarted due to continued complaints of muscle spasm. 9/12, patient experienced an acute change in mental status with delirium, paranoia.  She did seem to be improved from pain standpoint.  Her dilaudid PCA was stopped.  Baclofen was stopped.  We continued local pain control with Voltaren gel and Lidoderm patch. 9/13, she still was experiencing delirium and paranoia  while holding her opiates and baclofen.  Local pain control continued.  9/14, her delirium and paranoia had improved and pain had been controlled with local anesthetics. 9/15, Discharged to home with prescription for Voltaren gel and instruction to use ibuprofen and vicodin as needed for breakthrough pain.  2.  Acute delirium: suspect was due to pain and pain medications.  Workup for infectious etiology was negative.   Discharge Vitals:   BP 122/55 mmHg  Pulse 72  Temp(Src) 98.3 F (36.8 C) (Oral)  Resp 18  Ht 5' 2" (1.575 m)  Wt 184 lb 8.4 oz (83.7 kg)  BMI 33.74 kg/m2  SpO2 94%  Discharge Labs:  No results found. However, due to the size of the patient record, not all encounters were searched. Please check Results Review for a complete set of results.  Signed: Jule Ser, DO 10/29/2014, 8:59 PM    Services Ordered on Discharge: none Equipment Ordered on Discharge: none

## 2014-10-27 ENCOUNTER — Other Ambulatory Visit: Payer: Self-pay | Admitting: Internal Medicine

## 2014-10-27 DIAGNOSIS — K219 Gastro-esophageal reflux disease without esophagitis: Secondary | ICD-10-CM

## 2014-10-27 NOTE — Progress Notes (Signed)
Dr. Genene Churn  States when he did the discharge for patient she declined hhpt,  NCM notified [facility], patient refused hhpt

## 2014-10-27 NOTE — Care Management Note (Signed)
Case Management Note  Patient Details  Name: [name] MRN: [id number] Date of Birth: Feb 18, 1950  Subjective/Objective:     Per Dr. Genene Churn , patient refused hhpt at discharge.  [place] notified.               Action/Plan:   Expected Discharge Date:                  Expected Discharge Plan:  [place]  In-House Referral:     Discharge planning Services  CM Consult  Post Acute Care Choice:  Home Health Choice offered to:     DME Arranged:  N/A DME Agency:  NA  HH Arranged:  PT, Patient Refused [place] Agency:  [place]  Status of Service:  Completed, signed off  Medicare Important Message Given:  Yes-second notification given Date Medicare IM Given:    Medicare IM give by:    Date Additional Medicare IM Given:    Additional Medicare Important Message give by:     If discussed at [place] of Stay Meetings, dates discussed:    Additional Comments:  Zenon Mayo, RN 10/27/2014, 3:03 PM

## 2014-10-30 ENCOUNTER — Telehealth: Payer: Self-pay | Admitting: *Deleted

## 2014-10-30 NOTE — Telephone Encounter (Signed)
Transition Care Management Follow-up Telephone Call   Date discharged?9/22   How have you been since you were released from the hospital? About the same   Do you understand why you were in the hospital? yes   Do you understand the discharge instructions? yes   Where were you discharged to? home   Items Reviewed:  Medications reviewed: yes  Allergies reviewed: yes  Dietary changes reviewed: n/a  Referrals reviewed: yes   Functional Questionnaire:   Activities of Daily Living (ADLs):   She states they are independent in the following: self States they require assistance with the following: self   Any transportation issues/concerns?: yes   Any patient concerns? yes, i think i need to try that osteoporosis medicine he(dr klima) was talking about   Confirmed importance and date/time of follow-up visits scheduled yes, the date was changed due to pt's transportation issues, her sister works nights and provides all her transportation so appts need to match sister's schedule, appt changed to 9/27 at 0900 for 45 mins  Provider Appointment booked with [facility]  Confirmed with patient if condition begins to worsen call PCP or go to the ER.  Patient was given the office number and encouraged to call back with question or concerns.  : yes

## 2014-11-01 ENCOUNTER — Encounter (HOSPITAL_COMMUNITY): Payer: PPO

## 2014-11-01 ENCOUNTER — Ambulatory Visit: Payer: PPO | Admitting: Family

## 2014-11-02 ENCOUNTER — Ambulatory Visit: Payer: PPO | Admitting: Internal Medicine

## 2014-11-07 ENCOUNTER — Ambulatory Visit: Payer: PPO | Admitting: Internal Medicine

## 2014-11-07 ENCOUNTER — Inpatient Hospital Stay: Payer: PPO | Admitting: Internal Medicine

## 2014-11-10 ENCOUNTER — Other Ambulatory Visit: Payer: Self-pay | Admitting: Internal Medicine

## 2014-11-10 DIAGNOSIS — B3731 Acute candidiasis of vulva and vagina: Secondary | ICD-10-CM

## 2014-11-10 DIAGNOSIS — S2231XG Fracture of one rib, right side, subsequent encounter for fracture with delayed healing: Secondary | ICD-10-CM

## 2014-11-10 DIAGNOSIS — J301 Allergic rhinitis due to pollen: Secondary | ICD-10-CM

## 2014-11-10 DIAGNOSIS — B373 Candidiasis of vulva and vagina: Secondary | ICD-10-CM

## 2014-11-27 ENCOUNTER — Ambulatory Visit: Payer: PPO

## 2014-12-04 ENCOUNTER — Ambulatory Visit (INDEPENDENT_AMBULATORY_CARE_PROVIDER_SITE_OTHER): Payer: PPO | Admitting: Pharmacist

## 2014-12-04 DIAGNOSIS — I480 Paroxysmal atrial fibrillation: Secondary | ICD-10-CM

## 2014-12-04 DIAGNOSIS — Z7901 Long term (current) use of anticoagulants: Secondary | ICD-10-CM | POA: Diagnosis not present

## 2014-12-04 LAB — POCT INR: INR: 2.4

## 2014-12-04 NOTE — Patient Instructions (Signed)
Patient instructed to take medications as defined in the Anti-coagulation Track section of this encounter.  Patient instructed to take today's dose.  Patient verbalized understanding of these instructions.

## 2014-12-04 NOTE — Progress Notes (Signed)
Anti-Coagulation Progress Note  [name] is a 64 y.o. female who is currently on an anti-coagulation regimen.    RECENT RESULTS: Recent results are below, the most recent result is correlated with a dose of 20 mg. per week: Lab Results  Component Value Date   INR 2.40 12/04/2014   INR 2.76* 10/25/2014   INR 1.93* 10/23/2014    ANTI-COAG DOSE: Anticoagulation Dose Instructions as of 12/04/2014      [name] Tue Wed Thu Fri Sat   New Dose 2.5 mg 5 mg 2.5 mg 2.5 mg 2.5 mg 2.5 mg 2.5 mg       ANTICOAG SUMMARY: Anticoagulation Episode Summary    Current INR goal 2.0-3.0  Next INR check 01/22/2015  INR from last check 2.40 (12/04/2014)  Weekly max dose   Target end date   [facility]R check location [facility]  Preferred lab   Send INR reminders to [place]   Indications  Paroxysmal atrial fibrillation ([place]) [I48.0] Long term (current) use of anticoagulants [Z79.01]        Comments       Anticoagulation Care Providers    Provider Role Specialty Phone number   Lelon Perla, MD  Cardiology [phone]      ANTICOAG TODAY: Anticoagulation Summary as of 12/04/2014    INR goal 2.0-3.0  Selected INR 2.40 (12/04/2014)  Next INR check 01/22/2015  Target end date    Indications  Paroxysmal atrial fibrillation ([place]) [I48.0] Long term (current) use of anticoagulants [Z79.01]      Anticoagulation Episode Summary    INR check location [facility]   Preferred lab    Send INR reminders to [place]   Comments     Anticoagulation Care Providers    Provider Role Specialty Phone number   Lelon Perla, MD  Cardiology [phone]      PATIENT INSTRUCTIONS: Patient Instructions  Patient instructed to take medications as defined in the Anti-coagulation Track section of this encounter.  Patient instructed to take today's dose.  Patient verbalized understanding of these instructions.       FOLLOW-UP Return in 7  weeks (on 01/22/2015), or Follow up INR at 1000h.  Jorene Guest, III Pharm.D., CACP

## 2014-12-05 ENCOUNTER — Telehealth: Payer: Self-pay | Admitting: Internal Medicine

## 2014-12-05 NOTE — Telephone Encounter (Signed)
Rec'd call from [facility] @ [place]/for Transition of Care Plan Follow up.  Ann wan to notify Dr. in regards to patient non compliance with diabetes due to patients income and follow up with the patient's medications.

## 2014-12-05 NOTE — Telephone Encounter (Signed)
Spoke w/ ann, pt's situation is complicated at the moment. She does not easily allow people in to her personal and home life. We will stay in touch and will address situation w/ shanag. But at this time pt will need to ask and accept assistance.

## 2014-12-05 NOTE — Progress Notes (Signed)
Indication: Paroxsymal atrial fibrillation.  Duration: Lifelong.  INR at target.  Agree with Dr. Gladstone Pih assessment and plan as documented.

## 2014-12-12 ENCOUNTER — Other Ambulatory Visit: Payer: Self-pay | Admitting: Internal Medicine

## 2014-12-12 DIAGNOSIS — I480 Paroxysmal atrial fibrillation: Secondary | ICD-10-CM

## 2014-12-12 DIAGNOSIS — J439 Emphysema, unspecified: Secondary | ICD-10-CM

## 2014-12-20 NOTE — Patient Outreach (Signed)
[facility]) Care Management  12/20/2014  [name] Jul 03, 1950 [id number]   Referral from HTA tier 4 list, assigned Mariann Laster, RN for patient outreach.  Damita L. Rhodie, [place] Care Management Assistant

## 2014-12-25 ENCOUNTER — Encounter: Payer: PPO | Admitting: Internal Medicine

## 2014-12-25 ENCOUNTER — Encounter: Payer: Self-pay | Admitting: Internal Medicine

## 2014-12-25 ENCOUNTER — Ambulatory Visit (INDEPENDENT_AMBULATORY_CARE_PROVIDER_SITE_OTHER): Payer: PPO | Admitting: Internal Medicine

## 2014-12-25 VITALS — BP 127/35 | HR 82 | Temp 98.2°F | Wt 187.7 lb

## 2014-12-25 DIAGNOSIS — Z794 Long term (current) use of insulin: Secondary | ICD-10-CM | POA: Diagnosis not present

## 2014-12-25 DIAGNOSIS — E785 Hyperlipidemia, unspecified: Secondary | ICD-10-CM

## 2014-12-25 DIAGNOSIS — G8929 Other chronic pain: Secondary | ICD-10-CM

## 2014-12-25 DIAGNOSIS — F339 Major depressive disorder, recurrent, unspecified: Secondary | ICD-10-CM | POA: Diagnosis not present

## 2014-12-25 DIAGNOSIS — Z79899 Other long term (current) drug therapy: Secondary | ICD-10-CM

## 2014-12-25 DIAGNOSIS — M545 Low back pain, unspecified: Secondary | ICD-10-CM

## 2014-12-25 DIAGNOSIS — I480 Paroxysmal atrial fibrillation: Secondary | ICD-10-CM

## 2014-12-25 DIAGNOSIS — Z720 Tobacco use: Secondary | ICD-10-CM

## 2014-12-25 DIAGNOSIS — F329 Major depressive disorder, single episode, unspecified: Secondary | ICD-10-CM

## 2014-12-25 DIAGNOSIS — Z09 Encounter for follow-up examination after completed treatment for conditions other than malignant neoplasm: Secondary | ICD-10-CM | POA: Diagnosis not present

## 2014-12-25 DIAGNOSIS — D638 Anemia in other chronic diseases classified elsewhere: Secondary | ICD-10-CM

## 2014-12-25 DIAGNOSIS — M810 Age-related osteoporosis without current pathological fracture: Secondary | ICD-10-CM | POA: Diagnosis not present

## 2014-12-25 DIAGNOSIS — Z0000 Encounter for general adult medical examination without abnormal findings: Secondary | ICD-10-CM

## 2014-12-25 DIAGNOSIS — F17200 Nicotine dependence, unspecified, uncomplicated: Secondary | ICD-10-CM

## 2014-12-25 DIAGNOSIS — E1140 Type 2 diabetes mellitus with diabetic neuropathy, unspecified: Secondary | ICD-10-CM

## 2014-12-25 DIAGNOSIS — Z7901 Long term (current) use of anticoagulants: Secondary | ICD-10-CM

## 2014-12-25 DIAGNOSIS — S2241XD Multiple fractures of ribs, right side, subsequent encounter for fracture with routine healing: Secondary | ICD-10-CM

## 2014-12-25 DIAGNOSIS — I700 Atherosclerosis of aorta: Secondary | ICD-10-CM

## 2014-12-25 DIAGNOSIS — F419 Anxiety disorder, unspecified: Secondary | ICD-10-CM

## 2014-12-25 LAB — GLUCOSE, CAPILLARY: Glucose-Capillary: 217 mg/dL — ABNORMAL HIGH (ref 65–99)

## 2014-12-25 MED ORDER — HYDROCODONE-ACETAMINOPHEN 5-325 MG PO TABS: 1.0000 | ORAL_TABLET | Freq: Four times a day (QID) | ORAL | Status: DC | PRN

## 2014-12-25 MED ORDER — ALENDRONATE SODIUM 70 MG PO TABS: 70.0000 mg | ORAL_TABLET | ORAL | Status: DC

## 2014-12-25 NOTE — Assessment & Plan Note (Signed)
Assessment  We were unable to obtain a point-of-care hemoglobin A1c today because of a reading of anemia. Her blood sugars on average are just under 200. Given her multiple underlying chronic diseases this is a reasonable target. She has had 2 episodes of mild hypoglycemia that were symptomatic and this highlights the risks of tired her control in her.  At the last visit we increase the gabapentin to 300 mg by mouth 3 times daily for worsening painful peripheral diabetic neuropathy. Her symptoms are improved on the higher dose.  Plan  She is to continue the Levemir 45 units twice daily and NovoLog 18 units plus sliding scale with meals 3 times daily. A hemoglobin A1c was obtained and is pending at the time of this dictation. A diabetic foot exam was performed today and is documented. She has had a diabetic retinal exam within the last year we are currently trying to obtain those records. She is otherwise up-to-date on her diabetic health care maintenance.  We will continue the gabapentin at 300 mg by mouth 3 times daily for the painful peripheral diabetic neuropathy.

## 2014-12-25 NOTE — Assessment & Plan Note (Addendum)
Assessment  Her chronic back pain is stable. On the hydrocodone therapy she notes an improvement in her pain and therefore quality of life.  Plan  We will continue the hydrocodone-acetaminophen 5-325 mg 1-2 tablets every 6 hours as needed for chronic back pain. She was given 3 prescriptions for the next 3 months of therapy so that she would not have to come back to clinic to pick them up each month given her significant transportation problems. We will reassess her quality of life and functional status on the hydrocodone therapy at the follow-up visit. She will also order a TENS unit to see if this mode of therapy helps with her symptoms.

## 2014-12-25 NOTE — Assessment & Plan Note (Signed)
Assessment  She remains asymptomatic from her presumed paroxysmal atrial fibrillation.  Plan  We will continue with the Coumadin anticoagulation. This is managed in the anticoagulation clinic by Dr. Elie Confer. I will defer further adjustments to maintain an INR between 2 and 3 to him.

## 2014-12-25 NOTE — Assessment & Plan Note (Signed)
Assessment  Her anxiety continues but the symptoms are reasonably well-controlled with the as needed alprazolam.  Plan  We will continue the alprazolam at 0.5-1 mg by mouth every 12 hours as needed for anxiety.

## 2014-12-25 NOTE — Assessment & Plan Note (Signed)
Assessment  She had 2 admissions to the hospital in September for intractable pain related to 2 fractured right ribs that occurred during a fall. Her hospital course was complicated by delirium likely related to the pain medications. Since discharge, she states that her right-sided flank pain related to the rib fractures has resolved.  Plan  No further pain medication for the rib fractures is necessary as her symptoms have completely resolved. We will start bisphosphonate therapy for her underlying osteoporosis. Please see below or specifics.

## 2014-12-25 NOTE — Assessment & Plan Note (Signed)
Assessment  She may have significant anemia given that we were unable to obtain a point of care hemoglobin A1c secondary to reported anemia in the sample. She also has significant pica chewing 3 cups of ice during the visit. This would support a diagnosis of iron deficiency anemia for which she has not had before.  Plan  We obtained a complete blood count to assess for anemia given the inability to obtain a point of care hemoglobin A1c. The result is pending at the time of this dictation. Further assessment or therapy are pending the results of this study.

## 2014-12-25 NOTE — Patient Instructions (Signed)
It was good to see you again.  1) I started alendronate (Fosmax) 70 mg once weekly for weak bones.  2) I drew some blood.  I will call if there is anything concerning.  3) Keep taking your other medications as you are.  4) I think getting a TENS Unit is a good idea.  I will see you in 3 months, sooner if necessary.  Have a great Thanksgiving and happy holidays!

## 2014-12-25 NOTE — Assessment & Plan Note (Signed)
Assessment  She continues with smoking cessation despite understanding how it exacerbates and ultimately results in progression of many of her chronic medical problems. She simply as not ready mentally to quit at this time.  Plan  We will continue to address smoking cessation and offer pharmacotherapy at each visit.

## 2014-12-25 NOTE — Assessment & Plan Note (Signed)
Assessment  In the past she was treated with this phosphonate therapy as well as vitamin D and calcium. She stopped the bisphosphonate and was not interested in restarting it. She subsequently developed to rib fractures after a minor fall. This was associated with intractable pain requiring 2 hospital admissions. Fortunately her pain has completely resolved but she may reconsider her previous decision to stop the bisphosphonate therapy given these events.  Plan  I therefore took the opportunity to rediscuss the benefits of bisphosphonate therapy in the setting of osteoporosis. She is now interested in alendronate 70 mg by mouth weekly along with continuing the vitamin D and calcium. We will reassess her compliance with this therapy at the follow-up visit.

## 2014-12-25 NOTE — Assessment & Plan Note (Signed)
Assessment  She remains with active major depression with the PHQ score of 11. This is despite taking bupropion 100 mg by mouth twice daily and sertraline 200 mg by mouth daily. She has failed multiple other SSRIs in the past. She would likely benefit from titration of her medications for depression as well as with psychotherapy.  Plan  It was decided to continue with the bupropion and sertraline at their current doses. I will stress the importance of psychotherapy as an additive to her pharmacologic therapy for her major depression that has been poorly responsive to pharmacologic therapy. At this point transportation and cost remain a major limitation. If her symptoms persist we will consider titrating up the bupropion or converting the sertraline to another class of antidepressant which she has yet to take such as SNRIs.

## 2014-12-25 NOTE — Progress Notes (Signed)
   Subjective:    Patient ID: [name], female    DOB: [birth year], 64 y.o.   MRN: [id number]  HPI  [name] is here for hospital follow-up as well as follow-up of chronic pain, diabetes, and depression. Please see the A&P for the status of the pt's chronic medical problems.  Since she was discharged from the hospital she has noted a complete resolution of her right-sided flank pain related to to rib fractures. She had 3 days of lower extremity edema just prior to the hospital visit but this resolved spontaneously.  Review of Systems  Constitutional: Negative for activity change, appetite change and unexpected weight change.  Respiratory: Positive for shortness of breath. Negative for cough and chest tightness.   Cardiovascular: Positive for leg swelling. Negative for chest pain.  Gastrointestinal: Negative for nausea, vomiting, abdominal pain, diarrhea, constipation and abdominal distention.  Genitourinary: Negative for flank pain.  Musculoskeletal: Positive for back pain, arthralgias and gait problem. Negative for joint swelling.  Skin: Positive for pallor.  Psychiatric/Behavioral: Positive for depression and dysphoric mood. The patient is nervous/anxious.       Objective:   Physical Exam  Constitutional: She is oriented to person, place, and time. She appears well-developed and well-nourished. No distress.  HENT:  Head: Normocephalic and atraumatic.  Eyes: Conjunctivae are normal. Right eye exhibits no discharge. Left eye exhibits no discharge. No scleral icterus.  Cardiovascular: Normal rate, regular rhythm and normal heart sounds.  Exam reveals no gallop and no friction rub.   No murmur heard. Pulmonary/Chest: Effort normal and breath sounds normal. No respiratory distress. She has no wheezes. She has no rales.  Abdominal: Soft. Bowel sounds are normal. She exhibits no distension. There is no tenderness. There is no rebound and no guarding.  Musculoskeletal: Normal  range of motion. She exhibits no edema or tenderness.  Neurological: She is alert and oriented to person, place, and time. She exhibits normal muscle tone.  Skin: Skin is warm and dry. She is not diaphoretic. There is pallor.  Psychiatric: She has a normal mood and affect. Her behavior is normal. Judgment and thought content normal.  Nursing note and vitals reviewed.     Assessment & Plan:    Please see problem-oriented charting.

## 2014-12-25 NOTE — Assessment & Plan Note (Signed)
Assessment  Asymptomatic aortic atherosclerosis without symptomatic sequelae such as claudication.  Plan  We will continue aggressive treatment of her vascular risk factors including her diabetes and continued encouragement to quit smoking. We will reassess for symptoms related to her aortic atherosclerosis at the follow-up visit.

## 2014-12-25 NOTE — Assessment & Plan Note (Signed)
She is up-to-date on her health care maintenance. 

## 2014-12-25 NOTE — Assessment & Plan Note (Signed)
Assessment  She is tolerating the Crestor at 20 mg by mouth daily without myalgias.  Plan  We will continue the statin therapy, which is high intensity, at this dose and reassess her tolerance at the follow-up visit.

## 2014-12-26 ENCOUNTER — Other Ambulatory Visit: Payer: Self-pay

## 2014-12-26 ENCOUNTER — Telehealth: Payer: Self-pay | Admitting: Internal Medicine

## 2014-12-26 ENCOUNTER — Inpatient Hospital Stay (HOSPITAL_COMMUNITY)
Admission: EM | Admit: 2014-12-26 | Discharge: 2014-12-27 | DRG: 812 | Disposition: A | Discharge status: Home / Self Care | Payer: PPO | Attending: Oncology | Admitting: Oncology

## 2014-12-26 DIAGNOSIS — F321 Major depressive disorder, single episode, moderate: Secondary | ICD-10-CM | POA: Diagnosis present

## 2014-12-26 DIAGNOSIS — M810 Age-related osteoporosis without current pathological fracture: Secondary | ICD-10-CM | POA: Diagnosis present

## 2014-12-26 DIAGNOSIS — I872 Venous insufficiency (chronic) (peripheral): Secondary | ICD-10-CM | POA: Diagnosis present

## 2014-12-26 DIAGNOSIS — K644 Residual hemorrhoidal skin tags: Secondary | ICD-10-CM | POA: Diagnosis present

## 2014-12-26 DIAGNOSIS — I509 Heart failure, unspecified: Secondary | ICD-10-CM | POA: Diagnosis present

## 2014-12-26 DIAGNOSIS — J449 Chronic obstructive pulmonary disease, unspecified: Secondary | ICD-10-CM | POA: Diagnosis present

## 2014-12-26 DIAGNOSIS — F5089 Other specified eating disorder: Secondary | ICD-10-CM | POA: Diagnosis present

## 2014-12-26 DIAGNOSIS — K648 Other hemorrhoids: Secondary | ICD-10-CM | POA: Diagnosis present

## 2014-12-26 DIAGNOSIS — Z7901 Long term (current) use of anticoagulants: Secondary | ICD-10-CM | POA: Diagnosis not present

## 2014-12-26 DIAGNOSIS — D6489 Other specified anemias: Secondary | ICD-10-CM | POA: Diagnosis not present

## 2014-12-26 DIAGNOSIS — E1140 Type 2 diabetes mellitus with diabetic neuropathy, unspecified: Secondary | ICD-10-CM | POA: Diagnosis present

## 2014-12-26 DIAGNOSIS — Z952 Presence of prosthetic heart valve: Secondary | ICD-10-CM

## 2014-12-26 DIAGNOSIS — Z85528 Personal history of other malignant neoplasm of kidney: Secondary | ICD-10-CM | POA: Diagnosis not present

## 2014-12-26 DIAGNOSIS — Z9981 Dependence on supplemental oxygen: Secondary | ICD-10-CM

## 2014-12-26 DIAGNOSIS — I5033 Acute on chronic diastolic (congestive) heart failure: Secondary | ICD-10-CM | POA: Diagnosis present

## 2014-12-26 DIAGNOSIS — G4733 Obstructive sleep apnea (adult) (pediatric): Secondary | ICD-10-CM | POA: Diagnosis present

## 2014-12-26 DIAGNOSIS — K219 Gastro-esophageal reflux disease without esophagitis: Secondary | ICD-10-CM | POA: Diagnosis present

## 2014-12-26 DIAGNOSIS — E1169 Type 2 diabetes mellitus with other specified complication: Secondary | ICD-10-CM | POA: Diagnosis present

## 2014-12-26 DIAGNOSIS — F329 Major depressive disorder, single episode, unspecified: Secondary | ICD-10-CM | POA: Diagnosis present

## 2014-12-26 DIAGNOSIS — M545 Low back pain, unspecified: Secondary | ICD-10-CM | POA: Diagnosis present

## 2014-12-26 DIAGNOSIS — D509 Iron deficiency anemia, unspecified: Principal | ICD-10-CM | POA: Diagnosis present

## 2014-12-26 DIAGNOSIS — D649 Anemia, unspecified: Secondary | ICD-10-CM | POA: Diagnosis present

## 2014-12-26 DIAGNOSIS — Z794 Long term (current) use of insulin: Secondary | ICD-10-CM

## 2014-12-26 DIAGNOSIS — F322 Major depressive disorder, single episode, severe without psychotic features: Secondary | ICD-10-CM | POA: Diagnosis present

## 2014-12-26 DIAGNOSIS — Z79899 Other long term (current) drug therapy: Secondary | ICD-10-CM | POA: Diagnosis not present

## 2014-12-26 DIAGNOSIS — G8929 Other chronic pain: Secondary | ICD-10-CM | POA: Diagnosis present

## 2014-12-26 DIAGNOSIS — I5032 Chronic diastolic (congestive) heart failure: Secondary | ICD-10-CM | POA: Diagnosis present

## 2014-12-26 DIAGNOSIS — E785 Hyperlipidemia, unspecified: Secondary | ICD-10-CM | POA: Diagnosis present

## 2014-12-26 DIAGNOSIS — F419 Anxiety disorder, unspecified: Secondary | ICD-10-CM | POA: Diagnosis present

## 2014-12-26 DIAGNOSIS — H269 Unspecified cataract: Secondary | ICD-10-CM | POA: Diagnosis present

## 2014-12-26 DIAGNOSIS — F17210 Nicotine dependence, cigarettes, uncomplicated: Secondary | ICD-10-CM | POA: Diagnosis present

## 2014-12-26 DIAGNOSIS — I480 Paroxysmal atrial fibrillation: Secondary | ICD-10-CM | POA: Diagnosis present

## 2014-12-26 DIAGNOSIS — Z720 Tobacco use: Secondary | ICD-10-CM | POA: Diagnosis present

## 2014-12-26 DIAGNOSIS — I272 Other secondary pulmonary hypertension: Secondary | ICD-10-CM | POA: Diagnosis present

## 2014-12-26 DIAGNOSIS — E118 Type 2 diabetes mellitus with unspecified complications: Secondary | ICD-10-CM | POA: Diagnosis present

## 2014-12-26 DIAGNOSIS — M797 Fibromyalgia: Secondary | ICD-10-CM | POA: Diagnosis present

## 2014-12-26 DIAGNOSIS — D638 Anemia in other chronic diseases classified elsewhere: Secondary | ICD-10-CM | POA: Diagnosis present

## 2014-12-26 DIAGNOSIS — F17200 Nicotine dependence, unspecified, uncomplicated: Secondary | ICD-10-CM | POA: Diagnosis present

## 2014-12-26 DIAGNOSIS — J42 Unspecified chronic bronchitis: Secondary | ICD-10-CM

## 2014-12-26 DIAGNOSIS — J439 Emphysema, unspecified: Secondary | ICD-10-CM | POA: Diagnosis present

## 2014-12-26 HISTORY — DX: Reserved for inherently not codable concepts without codable children: IMO0001

## 2014-12-26 LAB — CBC WITH DIFFERENTIAL/PLATELET
Basophils Absolute: 0 10*3/uL (ref 0.0–0.2)
Basophils Absolute: 0 10*3/uL (ref 0.0–0.1)
Basophils Relative: 0 %
Basos: 0 %
EOS (ABSOLUTE): 0.1 10*3/uL (ref 0.0–0.4)
Eos: 1 %
Eosinophils Absolute: 0.1 10*3/uL (ref 0.0–0.7)
Eosinophils Relative: 1 %
HCT: 19.7 % — ABNORMAL LOW (ref 36.0–46.0)
Hematocrit: 19 % — ABNORMAL LOW (ref 34.0–46.6)
Hemoglobin: 5.5 g/dL — CL (ref 11.1–15.9)
Hemoglobin: 5.6 g/dL — CL (ref 12.0–15.0)
Immature Grans (Abs): 0 10*3/uL (ref 0.0–0.1)
Immature Granulocytes: 0 %
Lymphocytes Absolute: 1 10*3/uL (ref 0.7–3.1)
Lymphocytes Relative: 18 %
Lymphs Abs: 1.2 10*3/uL (ref 0.7–4.0)
Lymphs: 16 %
MCH: 21 pg — ABNORMAL LOW (ref 26.6–33.0)
MCH: 21.4 pg — ABNORMAL LOW (ref 26.0–34.0)
MCHC: 28.9 g/dL — ABNORMAL LOW (ref 31.5–35.7)
MCHC: 28.4 g/dL — ABNORMAL LOW (ref 30.0–36.0)
MCV: 73 fL — ABNORMAL LOW (ref 79–97)
MCV: 75.2 fL — ABNORMAL LOW (ref 78.0–100.0)
Monocytes Absolute: 0.6 10*3/uL (ref 0.1–0.9)
Monocytes Absolute: 0.6 10*3/uL (ref 0.1–1.0)
Monocytes Relative: 9 %
Monocytes: 8 %
Neutro Abs: 4.8 10*3/uL (ref 1.7–7.7)
Neutrophils Absolute: 5 10*3/uL (ref 1.4–7.0)
Neutrophils Relative %: 71 %
Neutrophils: 75 %
Platelets: 283 10*3/uL (ref 150–379)
Platelets: 280 10*3/uL (ref 150–400)
RBC: 2.62 x10E6/uL — CL (ref 3.77–5.28)
RBC: 2.62 MIL/uL — ABNORMAL LOW (ref 3.87–5.11)
RDW: 20.3 % — ABNORMAL HIGH (ref 12.3–15.4)
RDW: 20.1 % — ABNORMAL HIGH (ref 11.5–15.5)
WBC: 6.7 10*3/uL (ref 3.4–10.8)
WBC: 6.8 10*3/uL (ref 4.0–10.5)

## 2014-12-26 LAB — BASIC METABOLIC PANEL
Anion gap: 8 (ref 5–15)
BUN: 16 mg/dL (ref 6–20)
CO2: 26 mmol/L (ref 22–32)
Calcium: 8.8 mg/dL — ABNORMAL LOW (ref 8.9–10.3)
Chloride: 102 mmol/L (ref 101–111)
Creatinine, Ser: 1.19 mg/dL — ABNORMAL HIGH (ref 0.44–1.00)
GFR calc Af Amer: 54 mL/min — ABNORMAL LOW (ref >60)
GFR calc non Af Amer: 47 mL/min — ABNORMAL LOW (ref >60)
Glucose, Bld: 179 mg/dL — ABNORMAL HIGH (ref 65–99)
Potassium: 3.9 mmol/L (ref 3.5–5.1)
Sodium: 136 mmol/L (ref 135–145)

## 2014-12-26 LAB — TSH: TSH: 1.08 u[IU]/mL (ref 0.450–4.500)

## 2014-12-26 LAB — CBC
HCT: 27.6 % — ABNORMAL LOW (ref 36.0–46.0)
Hemoglobin: 8.3 g/dL — ABNORMAL LOW (ref 12.0–15.0)
MCH: 23.9 pg — ABNORMAL LOW (ref 26.0–34.0)
MCHC: 30.1 g/dL (ref 30.0–36.0)
MCV: 79.3 fL (ref 78.0–100.0)
Platelets: 259 10*3/uL (ref 150–400)
RBC: 3.48 MIL/uL — ABNORMAL LOW (ref 3.87–5.11)
RDW: 18.2 % — ABNORMAL HIGH (ref 11.5–15.5)
WBC: 7.2 10*3/uL (ref 4.0–10.5)

## 2014-12-26 LAB — FERRITIN: Ferritin: 8 ng/mL — ABNORMAL LOW (ref 15–150)

## 2014-12-26 LAB — GLUCOSE, CAPILLARY
Glucose-Capillary: 167 mg/dL — ABNORMAL HIGH (ref 65–99)
Glucose-Capillary: 193 mg/dL — ABNORMAL HIGH (ref 65–99)

## 2014-12-26 LAB — PREPARE RBC (CROSSMATCH)

## 2014-12-26 LAB — POC OCCULT BLOOD, ED: Fecal Occult Bld: NEGATIVE

## 2014-12-26 MED ORDER — SODIUM CHLORIDE 0.9 % IJ SOLN
3.0000 mL | Freq: Two times a day (BID) | INTRAMUSCULAR | Status: DC
  Administered 2014-12-26 – 2014-12-27 (×2): 3 mL via INTRAVENOUS

## 2014-12-26 MED ORDER — CALCIUM CARBONATE 1250 (500 CA) MG PO TABS
1.0000 | ORAL_TABLET | Freq: Two times a day (BID) | ORAL | Status: DC
  Administered 2014-12-27 (×2): 500 mg via ORAL
  Filled 2014-12-26: qty 1

## 2014-12-26 MED ORDER — ALBUTEROL SULFATE HFA 108 (90 BASE) MCG/ACT IN AERS: 1.0000 | INHALATION_SPRAY | Freq: Four times a day (QID) | RESPIRATORY_TRACT | Status: DC | PRN

## 2014-12-26 MED ORDER — HYDROCODONE-ACETAMINOPHEN 5-325 MG PO TABS
1.0000 | ORAL_TABLET | Freq: Four times a day (QID) | ORAL | Status: DC | PRN
  Administered 2014-12-26 – 2014-12-27 (×2): 2 via ORAL
  Filled 2014-12-26: qty 2
  Filled 2014-12-26 (×2): qty 1

## 2014-12-26 MED ORDER — FLUTICASONE PROPIONATE 50 MCG/ACT NA SUSP
2.0000 | Freq: Every day | NASAL | Status: DC
  Administered 2014-12-27: 2 via NASAL
  Filled 2014-12-26 (×2): qty 16

## 2014-12-26 MED ORDER — PANTOPRAZOLE SODIUM 40 MG IV SOLR
40.0000 mg | Freq: Two times a day (BID) | INTRAVENOUS | Status: DC
  Administered 2014-12-26 – 2014-12-27 (×2): 40 mg via INTRAVENOUS
  Filled 2014-12-26 (×2): qty 40

## 2014-12-26 MED ORDER — TIOTROPIUM BROMIDE MONOHYDRATE 18 MCG IN CAPS
18.0000 ug | ORAL_CAPSULE | Freq: Every day | RESPIRATORY_TRACT | Status: DC
  Administered 2014-12-27: 18 ug via RESPIRATORY_TRACT
  Filled 2014-12-26: qty 5

## 2014-12-26 MED ORDER — POTASSIUM CHLORIDE CRYS ER 20 MEQ PO TBCR
20.0000 meq | EXTENDED_RELEASE_TABLET | Freq: Two times a day (BID) | ORAL | Status: DC
  Administered 2014-12-26 – 2014-12-27 (×2): 20 meq via ORAL
  Filled 2014-12-26 (×2): qty 1

## 2014-12-26 MED ORDER — ALPRAZOLAM 0.5 MG PO TABS
0.5000 mg | ORAL_TABLET | Freq: Two times a day (BID) | ORAL | Status: DC | PRN
  Administered 2014-12-26 – 2014-12-27 (×2): 1 mg via ORAL
  Filled 2014-12-26 (×2): qty 2

## 2014-12-26 MED ORDER — FUROSEMIDE 10 MG/ML IJ SOLN
40.0000 mg | Freq: Once | INTRAMUSCULAR | Status: AC
  Administered 2014-12-26: 40 mg via INTRAVENOUS
  Filled 2014-12-26: qty 4

## 2014-12-26 MED ORDER — SERTRALINE HCL 100 MG PO TABS
200.0000 mg | ORAL_TABLET | Freq: Every day | ORAL | Status: DC
  Administered 2014-12-26 – 2014-12-27 (×2): 200 mg via ORAL
  Filled 2014-12-26 (×2): qty 2

## 2014-12-26 MED ORDER — ALBUTEROL SULFATE (2.5 MG/3ML) 0.083% IN NEBU: 2.5000 mg | INHALATION_SOLUTION | Freq: Four times a day (QID) | RESPIRATORY_TRACT | Status: DC | PRN

## 2014-12-26 MED ORDER — INSULIN ASPART 100 UNIT/ML ~~LOC~~ SOLN: 0.0000 [IU] | Freq: Every day | SUBCUTANEOUS | Status: DC

## 2014-12-26 MED ORDER — GABAPENTIN 300 MG PO CAPS
300.0000 mg | ORAL_CAPSULE | Freq: Three times a day (TID) | ORAL | Status: DC
  Administered 2014-12-26 – 2014-12-27 (×3): 300 mg via ORAL
  Filled 2014-12-26 (×2): qty 1

## 2014-12-26 MED ORDER — SODIUM CHLORIDE 0.9 % IV SOLN
Freq: Once | INTRAVENOUS | Status: AC
  Administered 2014-12-26: 14:00:00 via INTRAVENOUS

## 2014-12-26 MED ORDER — BUPROPION HCL 100 MG PO TABS
100.0000 mg | ORAL_TABLET | Freq: Two times a day (BID) | ORAL | Status: DC
  Administered 2014-12-26 – 2014-12-27 (×2): 100 mg via ORAL
  Filled 2014-12-26 (×3): qty 1

## 2014-12-26 MED ORDER — INSULIN DETEMIR 100 UNIT/ML ~~LOC~~ SOLN
25.0000 [IU] | Freq: Two times a day (BID) | SUBCUTANEOUS | Status: DC
  Administered 2014-12-26 – 2014-12-27 (×2): 25 [IU] via SUBCUTANEOUS
  Filled 2014-12-26 (×3): qty 0.25

## 2014-12-26 MED ORDER — MOMETASONE FURO-FORMOTEROL FUM 100-5 MCG/ACT IN AERO
2.0000 | INHALATION_SPRAY | Freq: Two times a day (BID) | RESPIRATORY_TRACT | Status: DC
  Administered 2014-12-26: 2 via RESPIRATORY_TRACT
  Filled 2014-12-26: qty 8.8

## 2014-12-26 MED ORDER — CALCIUM CARB-CHOLECALCIFEROL 600-800 MG-UNIT PO TABS: 1.0000 | ORAL_TABLET | Freq: Two times a day (BID) | ORAL | Status: DC

## 2014-12-26 MED ORDER — DICLOFENAC SODIUM 1 % TD GEL: 2.0000 g | Freq: Four times a day (QID) | TRANSDERMAL | Status: DC

## 2014-12-26 MED ORDER — ROSUVASTATIN CALCIUM 20 MG PO TABS
20.0000 mg | ORAL_TABLET | Freq: Every day | ORAL | Status: DC
  Administered 2014-12-26: 20 mg via ORAL
  Filled 2014-12-26: qty 1

## 2014-12-26 MED ORDER — CHOLECALCIFEROL 10 MCG (400 UNIT) PO TABS
800.0000 [IU] | ORAL_TABLET | Freq: Every day | ORAL | Status: DC
  Administered 2014-12-27: 800 [IU] via ORAL
  Filled 2014-12-26: qty 2

## 2014-12-26 MED ORDER — NICOTINE 21 MG/24HR TD PT24
21.0000 mg | MEDICATED_PATCH | Freq: Every day | TRANSDERMAL | Status: DC
  Administered 2014-12-26: 21 mg via TRANSDERMAL
  Filled 2014-12-26: qty 1

## 2014-12-26 MED ORDER — INSULIN ASPART 100 UNIT/ML ~~LOC~~ SOLN
0.0000 [IU] | Freq: Three times a day (TID) | SUBCUTANEOUS | Status: DC
  Administered 2014-12-27 (×3): 1 [IU] via SUBCUTANEOUS

## 2014-12-26 NOTE — H&P (Signed)
Date: 12/26/2014               Patient Name:  [name] MRN: [id number]  DOB: [birth year] Age / Sex: 64 y.o., female   PCP: [name], MD         Medical Service: Internal Medicine Teaching Service         Attending Physician: Dr. Annia Belt, MD    First Contact: Dr. Marlowe Sax Pager: [phone]  Second Contact: Dr. Genene Churn Pager: [phone]       After Hours (After 5p/  First Contact Pager: [phone]  weekends / holidays): Second Contact Pager: [phone]   Chief Complaint: low blood count   History of Present Illness: Patient is a 64 yo F with a PMHx of COPD, pulmonary HTN, CHF, anemia of chronic disease, GERD, adenomatous polyps, internal hemorrhoids, paroxysmal atrial fibrillation, Type II DM, clear cell renal cell carcinoma, aortic arthrosclerosis presenting to the ED to receive a blood transfusion. Patient states she went to her PCP (our clinic) for a routine checkup yesterday and was sent here today after her blood work showed low Hgb 5.6. Hgb was 9.4 in 10/2014. She reports feeling weak/ tired and feeling short of breath with activity. It was not clear from the history how long the patient has been having these symptoms. Does report eating ice all the time. Denies having any dizziness or CP. Reports having loose stools for the past 1 year. Denies having any hematochezia, melena, hematemesis, or hematuria. Patient does report having a history of GERD and states she is taking Omeprazole twice daily. Patient states she has been chronically anemic and has seen several doctors in they past including GI and hematology. As per patient, no one could identify why she was anemic. Patient does report having a history of kidney cancer and states interventional radiology "froze" the kidney 3 years ago.   Meds: Current Facility-Administered Medications  Medication Dose Route Frequency Provider Last Rate Last Dose  . insulin aspart (novoLOG) injection 0-5 Units  0-5 Units Subcutaneous QHS  [name], DO      . [START ON 12/27/2014] insulin aspart (novoLOG) injection 0-9 Units  0-9 Units Subcutaneous TID WC [name], DO      . insulin detemir (LEVEMIR) injection 25 Units  25 Units Subcutaneous BID [name], DO      . pantoprazole (PROTONIX) injection 40 mg  40 mg Intravenous Q12H [name], DO        Allergies: Allergies as of 12/26/2014 - Review Complete 12/26/2014  Allergen Reaction Noted  . Lorazepam  11/04/2010  . Morphine and related Other (See Comments) 06/27/2013  . Oxycontin [oxycodone] Other (See Comments) 05/03/2012  . Tramadol hcl     Past Medical History  Diagnosis Date  . COPD (chronic obstructive pulmonary disease) with emphysema (HCC)     PFTs 02/2012: FEV1 0.92 (40%), ratio 69, 27% increase in FEV1 with BD, TLC 91%, severe airtrapping, DLCO49% On chronic home O2. Pulmonary rehab referral 05/2012   . Moderate to severe pulmonary hypertension ([place])     2014 TEE w PA peak pressure 46 mmHg, s/p MV replacement   . Tobacco abuse 07/28/2012  . Hyperlipidemia LDL goal < 100 11/20/2005  . Carotid artery stenosis     s/p right endarterectomy (06/2010) Carotid US (07/2010):  Left: Moderate-to-severe (60-79%) calcific and non-calcific plaque origin and proximal ICA and ECA   . Mitral stenosis     s/p Mitral valve replacement with a  27-mm pericardial porcine valve (Medtronic Mosaic valve, serial [id number] on 09/20/10, Dr. Prescott Gum)   . Chronic congestive heart failure with left ventricular diastolic dysfunction ([place]) 10/21/2010  . Depression 11/19/2005  . Anxiety 07/24/2010  . Fibromyalgia 08/29/2010  . Obesity (BMI 30.0-34.9) 10/23/2011  . Adenomatous polyps 05/14/2011    Colonoscopy (05/2011): 4 mm adenomatous polyp excised endoscopically Colonoscopy (02/2002): Adenomatous polyp excised endoscopically   . Gastroesophageal reflux disease   . Internal hemorrhoids 08/04/2012  . Osteoporosis     DEXA (12/09/2011): L-spine T -3.7, left hip T -1.4 DEXA  (12/2004): L-spine T -2.6, left hip -0.1   . Allergic rhinitis 06/01/2012  . Bilateral cataracts 08/04/2012    Visually insignificant   . Chronic constipation 02/03/2011  . Chronic venous insufficiency 08/04/2012  . Paroxysmal atrial fibrillation ([place]) 10/22/2010    s/p Left atrial maze procedure for paroxysmal atrial fibrillation on 09/20/2010 by Dr Prescott Gum.  Subsequent splenic infarct, decision was made to re-anticoagulate with coumadin, likely life-long as this is the most likely cause of the splenic infarct.   . Chronic low back pain 10/06/2012  . Anemia of chronic disease 01/01/2013  . Obstructive sleep apnea     Nocturnal polysomnography (06/2009): Moderate sleep apnea/ hypopnea syndrome , AHI 17.8 per hour with nonpositional hypopneas. CPAP titration to 12 CWP, AHI 2.4 per hour. On nocturnal CPAP via a small resMed Quattro full-face mask with heated humidifier.   . Type 2 diabetes mellitus with diabetic neuropathy ([place])   . History of blood transfusion     "several times"   . Clear cell renal cell carcinoma ([place]) 07/21/2011    s/p cryoablation of left RCC in 09/2011 by [name]. Followed by [name]  [facility]) .    Marland Kitchen Chronic daily headache 01/16/2014  . Right nephrolithiasis 09/06/2014    5 mm non-obstructing calculus seen on CT scan 09/05/2014   . Aortic atherosclerosis ([place]) 10/19/2014    Seen on CT scan, currently asymptomatic   Past Surgical History  Procedure Laterality Date  . Orif clavicle fracture  01/2004    by Thana Farr. [name], M.D for Right clavicle nonunion.  . Tonsillectomy    . Carotid endarterectomy Right 07/04/2010    by [name] for asymptomatic right carotid artery stenosis  . Lipoma excision  08/2005    occipital lipoma 1.5cm - by Dr. [name]  . Hysteroscopy with endometrial ablation  06/2001    for persistent post-menopausal bleeding // by S. [name], M.D.  . Mitral valve replacement  09/20/10     with a 27-mm pericardial porcine valve  (Medtronic Mosaic valve, serial #[id number]). 09/20/10, Dr Prescott Gum  . Maze Left 09/20/10    for paroxysmal atrial fibrillation (Dr. Prescott Gum)  . Chest tube insertion  09/24/2010    Dr Prescott Gum  . Cardiac valve replacement  Aug. 2012    "mitral valve"  . Colonoscopy  05/12/2011    performed by Dr. Michail Sermon. Showing small internal hemorrhoids, single tubular adenoma polyp  . Esophagogastroduodenoscopy  05/12/2011    performed by Dr. Michail Sermon. Negative for ulcerations, biopsy negative for evidence of celiac sprue  . Tubal ligation    . Eye surgery    . Fracture surgery Right 2005    clavicle  . Dilation and curettage of uterus    . Cryoablation Left 09/2011    by [name]. Followed by [name]  [facility]) .     Family History  Problem Relation Age of Onset  .  Peptic Ulcer Disease Father   . Heart attack Father 1    Died of MI at age 48  . Heart attack Brother 56    Died of MI at age 56  . Obesity Brother   . Pneumonia Mother   . Healthy Sister   . Lupus Daughter   . Obsessive Compulsive Disorder Daughter    Social History   Social History  . Marital Status: Divorced    Spouse Name: N/A  . Number of Children: N/A  . Years of Education: N/A   Occupational History  . Not on file.   Social History Main Topics  . Smoking status: Current Every Day Smoker -- 1.50 packs/day for 49 years    Types: Cigarettes    Start date: 10/31/2013    Last Attempt to Quit: 10/31/2013  . Smokeless tobacco: Never Used     Comment: 1 1/2  PPD down to 1 ppd  . Alcohol Use: No  . Drug Use: No  . Sexual Activity: No   Other Topics Concern  . Not on file   Social History Narrative   Lives alone in [place] ([place]) with 60 pound chow   Worked at [employer] for 18 years   No car    Review of Systems: Review of Systems  Constitutional: Positive for malaise/fatigue. Negative for fever and chills.  HENT: Negative for ear pain.        Pulsatile tinnitus     Eyes: Negative for blurred vision and pain.  Respiratory: Positive for cough and shortness of breath.   Cardiovascular: Negative for chest pain, palpitations and leg swelling.  Gastrointestinal: Negative for nausea, vomiting, abdominal pain, constipation, blood in stool and melena.       Loose stools (chronic)  Genitourinary: Negative for dysuria, urgency, frequency and hematuria.  Musculoskeletal:       Muscle cramps   Skin: Negative for itching and rash.  Neurological: Negative for dizziness, sensory change, focal weakness and headaches.    Physical Exam: Blood pressure 143/66, pulse 87, temperature 98 F (36.7 C), temperature source Oral, resp. rate 22, height _0  (1.575 m), weight 187 lb (84.823 kg), SpO2 100 %. Physical Exam  Constitutional: She is oriented to person, place, and time. She appears well-developed and well-nourished.  HENT:  Head: Normocephalic and atraumatic.  Eyes: EOM are normal. Pupils are equal, round, and reactive to light.  Conjunctival pallor   Neck: Neck supple. No tracheal deviation present.  Cardiovascular: Normal rate, regular rhythm and intact distal pulses.   Pulmonary/Chest: Effort normal and breath sounds normal. She has no wheezes. She has no rales.  Abdominal: Soft. Bowel sounds are normal. She exhibits no distension. There is no tenderness. There is no rebound and no guarding.  Musculoskeletal: She exhibits no edema.  L leg cramping   Neurological: She is alert and oriented to person, place, and time.  Skin: Skin is warm and dry.    Lab results: Basic Metabolic Panel:  Recent Labs  12/26/14 1150  NA 136  K 3.9  CL 102  CO2 26  GLUCOSE 179*  BUN 16  CREATININE 1.19*  CALCIUM 8.8*   CBC:  Recent Labs  12/25/14 1138 12/26/14 1150  WBC 6.7 6.8  NEUTROABS 5.0 4.8  HGB  --  5.6*  HCT 19.0* 19.7*  MCV  --  75.2*  PLT  --  280   CBG:  Recent Labs  12/25/14 1036  GLUCAP 217*   Hemoglobin A1C:  Recent Labs   12/25/14 1138  HGBA1C 8.2*   Thyroid Function Tests:  Recent Labs  12/25/14 1138  TSH 1.080   Anemia Panel:  Recent Labs  12/25/14 1138  FERRITIN 8*   Urine Drug Screen: Drugs of Abuse     Component Value Date/Time   LABOPIA POSITIVE* 01/07/2013 2114   COCAINSCRNUR NONE DETECTED 01/07/2013 2114   LABBENZ NONE DETECTED 01/07/2013 2114   AMPHETMU NONE DETECTED 01/07/2013 2114   THCU NONE DETECTED 01/07/2013 2114   LABBARB NONE DETECTED 01/07/2013 2114    Alcohol Level: No results for input(s): ETH in the last 72 hours. Urinalysis: No results for input(s): COLORURINE, LABSPEC, PHURINE, GLUCOSEU, HGBUR, BILIRUBINUR, KETONESUR, PROTEINUR, UROBILINOGEN, NITRITE, LEUKOCYTESUR in the last 72 hours.  Invalid input(s): APPERANCEUR  Imaging results:  No results found.  Other results: EKG: sinus rhythm, right axis deviation  Assessment & Plan by Problem: Active Problems:   Major depression, chronic (HCC)   Gastroesophageal reflux disease   Chronic congestive heart failure with left ventricular diastolic dysfunction (HCC)   Paroxysmal atrial fibrillation (HCC)   Long term (current) use of anticoagulants   Chronic low back pain   Symptomatic anemia  Symptomatic IDA Patient has a history of chronic anemia and pica. Hgb 5.6 now, was 9.4 in 10/2014. Ferritin low (8). FOBT done in the ED was negative. Patient does report feeling weak/tired and having SOB with activity. EGD from 05/2011 was normal and colonoscopy at the time showing internal hemorrhoids. Patient is currently hemodynamically stable. BP 143/66 and pulse 87. She received 2 units of PRBCs in the ED. -Admit to tele  -GI has been consulted and will come evaluate with patient tomorrow. Appreciate their recommendations.  -F/u post-transfusion CBC -CBC in am -CMP in am  -orthostatic vitals -PT/OT eval and treat  CHF: Echo from 08/2012 showing LVEF 55-60%. Lungs sound clear but patient does have +1 pitting edema of b/l  LEs. Patient received Lasix 40 mg IV in the ED.  -Holding Lasix for now  -continue K supplementation since patient gets leg cramps  -daily weights -strict I/Os  DMII -Levemir 25u BID -SSI  Anxiety -continue Alprazolam 0.5-1 mg BID  Chronic low back pain  -continue Norco 5-325 1-2 tabs q6 -continue Voltaren gel qid  COPD: currently satting 97-100% on 2L via . Patient reports being on home oxygen. -continue Albuterol prn -continue Dulera BID -continue Spiriva daily   Depression -continue Buproprion 100 mg BID -continue Sertraline 200 mg daily  Neuropathy -continue Gabapentin 300 mg TID  Tobacco use -Nicotine patch   HLD -continue Crestor 20 mg QHS  GERD -Protonix 40 mg IV q12   DVT ppx: SCDs  Diet: carb modified   Code: FULL  Dispo: Disposition is deferred at this time, awaiting improvement of current medical problems. Anticipated discharge in approximately 1-2 day(s).   The patient does have a current PCP [name], MD) and does need an [facility] hospital follow-up appointment after discharge.  The patient does not have transportation limitations that hinder transportation to clinic appointments.  Signed: Shela Leff, MD 12/26/2014, 6:12 PM

## 2014-12-26 NOTE — Telephone Encounter (Signed)
Contacted pt's sister-who got in contact with patient.  Patient instructed to return to ED for repeat CBC and admission.  Pt agreed.Regenia Skeeter, Darlene Cassady11/15/201610:03 AM

## 2014-12-26 NOTE — ED Notes (Addendum)
Pt reports she had lab work done at her PCP yesterday and was called today and told to come to the ER for a blood transfusion for HGB 5.5. Pt is alert and oriented X 4. Pt reports shortness of breath and weakness as well but reports she always has this. Pt also reports a pounding feeling in her head. Pt wears 2L O2 via [place] at all times.

## 2014-12-26 NOTE — Telephone Encounter (Signed)
I received a call from [facility] for a critical value of Hemoglobin of 5.5.  [name] was seen by her PCP today Dr Eppie Gibson who was concerned about worsening anemia.  I called the number on file for Kathryn Hautala but did not get an answer.  I left a message on her personal VM asking her to come to the ED or call the clinic for more information but that she was anemic and may need a blood transfusion.  -Lucious Groves, DO

## 2014-12-26 NOTE — ED Provider Notes (Signed)
CSN: [id number]     Arrival date & time 12/26/14  1141 History   First MD Initiated Contact with Patient 12/26/14 1201     Chief Complaint  Patient presents with  . low hemoglobin      (Consider location/radiation/quality/duration/timing/severity/associated sxs/prior Treatment) HPI Patient reports her family physician contacted her to come to the emergency department for blood transfusion she reports she had her labs drawn yesterday and her hemoglobin came back very low. She reports she is chronically short of breath and weak. She states this may have been somewhat more pronounced over the past few days to weeks. She is chronically on oxygen for COPD. She reports she also gets lower extremity swelling that is chronic. She states she has not taken her Lasix yet today and her legs are somewhat more swollen. The patient reports she has been anemic in the past she cannot recall how long ago was that she required a blood transfusion. She does report she has had endoscopies and there is never been a GI source identified. She denies she's had bloody or black appearing stool. She has not had vomiting. She reports she has some abdominal pain which she identifies as frequent and not a new finding. Past Medical History  Diagnosis Date  . COPD (chronic obstructive pulmonary disease) with emphysema (HCC)     PFTs 02/2012: FEV1 0.92 (40%), ratio 69, 27% increase in FEV1 with BD, TLC 91%, severe airtrapping, DLCO49% On chronic home O2. Pulmonary rehab referral 05/2012   . Moderate to severe pulmonary hypertension ([place])     2014 TEE w PA peak pressure 46 mmHg, s/p MV replacement   . Tobacco abuse 07/28/2012  . Hyperlipidemia LDL goal < 100 11/20/2005  . Carotid artery stenosis     s/p right endarterectomy (06/2010) Carotid US (07/2010):  Left: Moderate-to-severe (60-79%) calcific and non-calcific plaque origin and proximal ICA and ECA   . Mitral stenosis     s/p Mitral valve replacement with a 27-mm pericardial  porcine valve (Medtronic Mosaic valve, serial #[id number] on 09/20/10, Dr. Prescott Gum)   . Chronic congestive heart failure with left ventricular diastolic dysfunction ([place]) 10/21/2010  . Depression 11/19/2005  . Anxiety 07/24/2010  . Fibromyalgia 08/29/2010  . Obesity (BMI 30.0-34.9) 10/23/2011  . Adenomatous polyps 05/14/2011    Colonoscopy (05/2011): 4 mm adenomatous polyp excised endoscopically Colonoscopy (02/2002): Adenomatous polyp excised endoscopically   . Gastroesophageal reflux disease   . Internal hemorrhoids 08/04/2012  . Osteoporosis     DEXA (12/09/2011): L-spine T -3.7, left hip T -1.4 DEXA (12/2004): L-spine T -2.6, left hip -0.1   . Allergic rhinitis 06/01/2012  . Bilateral cataracts 08/04/2012    Visually insignificant   . Chronic constipation 02/03/2011  . Chronic venous insufficiency 08/04/2012  . Paroxysmal atrial fibrillation ([place]) 10/22/2010    s/p Left atrial maze procedure for paroxysmal atrial fibrillation on 09/20/2010 by Dr Prescott Gum.  Subsequent splenic infarct, decision was made to re-anticoagulate with coumadin, likely life-long as this is the most likely cause of the splenic infarct.   . Chronic low back pain 10/06/2012  . Anemia of chronic disease 01/01/2013  . Obstructive sleep apnea     Nocturnal polysomnography (06/2009): Moderate sleep apnea/ hypopnea syndrome , AHI 17.8 per hour with nonpositional hypopneas. CPAP titration to 12 CWP, AHI 2.4 per hour. On nocturnal CPAP via a small resMed Quattro full-face mask with heated humidifier.   . Type 2 diabetes mellitus with diabetic neuropathy ([place])   . History of blood  transfusion     "several times"   . Clear cell renal cell carcinoma ([place]) 07/21/2011    s/p cryoablation of left RCC in 09/2011 by [name]. Followed by [name]  [facility]) .    Marland Kitchen Chronic daily headache 01/16/2014  . Right nephrolithiasis 09/06/2014    5 mm non-obstructing calculus seen on CT scan 09/05/2014   . Aortic atherosclerosis ([place])  10/19/2014    Seen on CT scan, currently asymptomatic   Past Surgical History  Procedure Laterality Date  . Orif clavicle fracture  01/2004    by Thana Farr. [name], M.D for Right clavicle nonunion.  . Tonsillectomy    . Carotid endarterectomy Right 07/04/2010    by [name] for asymptomatic right carotid artery stenosis  . Lipoma excision  08/2005    occipital lipoma 1.5cm - by Dr. [name]  . Hysteroscopy with endometrial ablation  06/2001    for persistent post-menopausal bleeding // by S. [name], M.D.  . Mitral valve replacement  09/20/10     with a 27-mm pericardial porcine valve (Medtronic Mosaic valve, serial #[id number]). 09/20/10, Dr Prescott Gum  . Maze Left 09/20/10    for paroxysmal atrial fibrillation (Dr. Prescott Gum)  . Chest tube insertion  09/24/2010    Dr Prescott Gum  . Cardiac valve replacement  Aug. 2012    "mitral valve"  . Colonoscopy  05/12/2011    performed by Dr. Michail Sermon. Showing small internal hemorrhoids, single tubular adenoma polyp  . Esophagogastroduodenoscopy  05/12/2011    performed by Dr. Michail Sermon. Negative for ulcerations, biopsy negative for evidence of celiac sprue  . Tubal ligation    . Eye surgery    . Fracture surgery Right 2005    clavicle  . Dilation and curettage of uterus    . Cryoablation Left 09/2011    by [name]. Followed by [name]  [facility]) .     Family History  Problem Relation Age of Onset  . Peptic Ulcer Disease Father   . Heart attack Father 66    Died of MI at age 56  . Heart attack Brother 7    Died of MI at age 23  . Obesity Brother   . Pneumonia Mother   . Healthy Sister   . Lupus Daughter   . Obsessive Compulsive Disorder Daughter    Social History  Substance Use Topics  . Smoking status: Current Every Day Smoker -- 1.50 packs/day for 49 years    Types: Cigarettes    Start date: 10/31/2013    Last Attempt to Quit: 10/31/2013  . Smokeless tobacco: Never Used     Comment: 1 1/2  PPD down to 1 ppd  .  Alcohol Use: No   OB History    No data available     Review of Systems 10 Systems reviewed and are negative for acute change except as noted in the HPI.    Allergies  Lorazepam; Morphine and related; Oxycontin; and Tramadol hcl  Home Medications   Prior to Admission medications   Medication Sig Start Date End Date Taking? Authorizing Provider  albuterol (PROAIR HFA) 108 (90 BASE) MCG/ACT inhaler Inhale 1-2 puffs into the lungs every 6 (six) hours as needed for wheezing or shortness of breath. 12/13/14  Yes [name], MD  ALPRAZolam [name]) 1 MG tablet Take 0.5-1 tablets (0.5-1 mg total) by mouth 2 (two) times daily as needed for anxiety. 10/18/14  Yes [name], MD  buPROPion [facility]) 100 MG  tablet Take 1 tablet (100 mg total) by mouth 2 (two) times daily. 10/12/14  Yes [name], MD  Calcium Carb-Cholecalciferol 600-800 MG-UNIT TABS Take 1 tablet by mouth 2 (two) times daily.  08/04/12  Yes [name], MD  diclofenac sodium (VOLTAREN) 1 % GEL Apply 2 g topically 4 (four) times daily. 11/10/14  Yes [name], MD  fluticasone (FLONASE) 50 MCG/ACT nasal spray Place 2 sprays into both nostrils daily. 05/23/14  Yes [name], MD  Fluticasone-Salmeterol (ADVAIR DISKUS) 250-50 MCG/DOSE AEPB Inhale 1 puff into the lungs 2 (two) times daily. 05/23/14  Yes [name], MD  furosemide (LASIX) 80 MG tablet Take 1 tablet (80 mg total) by mouth 2 (two) times daily. 07/19/14  Yes [name], MD  gabapentin (NEURONTIN) 300 MG capsule Take 1 capsule (300 mg total) by mouth 3 (three) times daily. 10/06/14  Yes [name], MD  HYDROcodone-acetaminophen (NORCO/VICODIN) 5-325 MG tablet Take 1-2 tablets by mouth every 6 (six) hours as needed for severe pain. Patient taking differently: Take 2 tablets by mouth every 6 (six) hours as needed for severe pain.  12/25/14  Yes [name], MD  insulin aspart (NOVOLOG) cartridge Inject 18 Units into the skin 3 (three) times daily  with meals. Patient taking differently: Inject 18 Units into the skin 3 (three) times daily with meals. 18 units plus sliding scale 02/17/14  Yes [name], MD  Insulin Detemir (LEVEMIR) 100 UNIT/ML Pen Inject 45 Units into the skin 2 (two) times daily. 04/05/14  Yes [name], MD  nystatin cream (MYCOSTATIN) Apply 1 application topically 2 (two) times daily. 02/17/14  Yes [name], MD  omeprazole (PRILOSEC) 40 MG capsule Take 1 capsule (40 mg total) by mouth 2 (two) times daily. 10/30/14  Yes [name], MD  potassium chloride SA (K-DUR,KLOR-CON) 20 MEQ tablet Take 1 tablet (20 mEq total) by mouth 2 (two) times daily. 10/18/14  Yes [name], MD  rosuvastatin (CRESTOR) 20 MG tablet Take 1 tablet (20 mg total) by mouth at bedtime. 10/12/14  Yes [name], MD  sertraline (ZOLOFT) 100 MG tablet Take 2 tablets (200 mg total) by mouth daily. 10/12/14  Yes [name], MD  tiotropium (SPIRIVA HANDIHALER) 18 MCG inhalation capsule Place 1 capsule (18 mcg total) into inhaler and inhale daily. 10/12/14  Yes [name], MD  warfarin (COUMADIN) 5 MG tablet Take 1/2 tablet (2.5 mg) daily except on Mondays take 1 tablet (5 mg) Patient taking differently: Take 2.5-5 mg by mouth daily at 6 PM. Take 1/2 tablet (2.5 mg) daily except on Mondays take 1 tablet (5 mg) 12/13/14  Yes [name], MD  alendronate (FOSAMAX) 70 MG tablet Take 1 tablet (70 mg total) by mouth every 7 (seven) days. Take with a full glass of water on an empty stomach. Patient not taking: Reported on 12/26/2014 12/25/14   [name], MD  Blood Glucose Monitoring Suppl (ONETOUCH VERIO) W/DEVICE KIT 1 each by Does not apply route 4 (four) times daily. 07/25/14   [name], MD  chlorpheniramine (CHLOR-TRIMETON) 4 MG tablet Take 1 tablet (4 mg total) by mouth 3 (three) times daily as needed for allergies. Patient not taking: Reported on 12/26/2014 10/06/14   [name], MD  EASY TOUCH PEN NEEDLES 31G X 5 MM [place]  USE TO INJECT INSULIN 5 TIMES DAILY 08/23/14   [name], MD  Lancets Misc. (ACCU-CHEK FASTCLIX LANCET) KIT Check your blood 4 times a day dx code 250.00 insulin requiring 01/19/13   Purcell Nails  Eppie Gibson, MD  lidocaine (XYLOCAINE) 2 % jelly APPLY TO AFFECTED AREA THREE TIMES A DAY Patient not taking: Reported on 12/26/2014 10/18/14   [name], MD  [facility] VERIO test strip Check blood sugar up to 4 times a day 07/25/14   [name], MD   BP 121/44 mmHg  Pulse 87  Temp(Src) 98.4 F (36.9 C) (Oral)  Resp 16  Ht _0  (1.575 m)  Wt 187 lb (84.823 kg)  BMI 34.19 kg/m2  SpO2 100% Physical Exam  Constitutional: She is oriented to person, place, and time. She appears well-developed and well-nourished.  Patient is moderately obese. She is alert and nontoxic. She is on supple oxygen but does not have  respiratory distress at rest  HENT:  Head: Normocephalic and atraumatic.  Mouth/Throat: Oropharynx is clear and moist.  Eyes: EOM are normal. Pupils are equal, round, and reactive to light.  Neck: Neck supple.  Cardiovascular: Normal rate, regular rhythm, normal heart sounds and intact distal pulses.   Pulmonary/Chest: Effort normal.  Patient has soft, distant breath sounds with fine expiratory wheeze.  Abdominal: Soft. Bowel sounds are normal. She exhibits no distension. There is tenderness.  Patient endorses some diffuse tenderness without guarding or rebound.  Genitourinary:  Rectal examination: Trace amount of brownish yellow stool in the vault. No melena or tarry stool.  Musculoskeletal: Normal range of motion. She exhibits edema.  Patient has 1+ to 2+ pitting edema bilateral lower extremity is.  Neurological: She is alert and oriented to person, place, and time. She has normal strength. She exhibits normal muscle tone. Coordination normal. GCS eye subscore is 4. GCS verbal subscore is 5. GCS motor subscore is 6.  Skin: Skin is warm, dry and intact.  Psychiatric: She has a normal mood  and affect.    ED Course  Procedures (including critical care time) Labs Review Labs Reviewed  CBC WITH DIFFERENTIAL/PLATELET - Abnormal; Notable for the following:    RBC 2.62 (*)    Hemoglobin 5.6 (*)    HCT 19.7 (*)    MCV 75.2 (*)    MCH 21.4 (*)    MCHC 28.4 (*)    RDW 20.1 (*)    All other components within normal limits  BASIC METABOLIC PANEL - Abnormal; Notable for the following:    Glucose, Bld 179 (*)    Creatinine, Ser 1.19 (*)    Calcium 8.8 (*)    GFR calc non Af Amer 47 (*)    GFR calc Af Amer 54 (*)    All other components within normal limits  POC OCCULT BLOOD, ED  TYPE AND SCREEN  PREPARE RBC (CROSSMATCH)    Imaging Review No results found. I have personally reviewed and evaluated these images and lab results as part of my medical decision-making.   EKG Interpretation None     Consult: Patient is reviewed with internal medicine resident service for admission MDM   Final diagnoses:  Symptomatic anemia  Chronic bronchitis, unspecified chronic bronchitis type [facility])   Patient sent to the emergency department from her family 63 office for blood transfusion was symptomatically anemia. The patient is alert and appropriate with clear mental status. Her respiratory status is stable without evidence of acute respiratory failure. She is chronically on oxygen and reports chronic shortness of breath which is not significantly different at rest. He has noted increased exertional dyspnea. She does not report chest pain.    Charlesetta Shanks, MD 12/26/14 [phone]

## 2014-12-27 ENCOUNTER — Encounter (HOSPITAL_COMMUNITY): Payer: Self-pay | Admitting: General Practice

## 2014-12-27 ENCOUNTER — Other Ambulatory Visit: Payer: Self-pay

## 2014-12-27 DIAGNOSIS — D6489 Other specified anemias: Secondary | ICD-10-CM

## 2014-12-27 LAB — CBC
HCT: 25.8 % — ABNORMAL LOW (ref 36.0–46.0)
Hemoglobin: 7.8 g/dL — ABNORMAL LOW (ref 12.0–15.0)
MCH: 23.9 pg — ABNORMAL LOW (ref 26.0–34.0)
MCHC: 30.2 g/dL (ref 30.0–36.0)
MCV: 79.1 fL (ref 78.0–100.0)
Platelets: 242 10*3/uL (ref 150–400)
RBC: 3.26 MIL/uL — ABNORMAL LOW (ref 3.87–5.11)
RDW: 18.2 % — ABNORMAL HIGH (ref 11.5–15.5)
WBC: 7.5 10*3/uL (ref 4.0–10.5)

## 2014-12-27 LAB — COMPREHENSIVE METABOLIC PANEL
ALT: 13 U/L — ABNORMAL LOW (ref 14–54)
AST: 15 U/L (ref 15–41)
Albumin: 3.2 g/dL — ABNORMAL LOW (ref 3.5–5.0)
Alkaline Phosphatase: 54 U/L (ref 38–126)
Anion gap: 7 (ref 5–15)
BUN: 14 mg/dL (ref 6–20)
CO2: 28 mmol/L (ref 22–32)
Calcium: 9 mg/dL (ref 8.9–10.3)
Chloride: 104 mmol/L (ref 101–111)
Creatinine, Ser: 1.07 mg/dL — ABNORMAL HIGH (ref 0.44–1.00)
GFR calc Af Amer: >60 mL/min (ref >60)
GFR calc non Af Amer: 53 mL/min — ABNORMAL LOW (ref >60)
Glucose, Bld: 173 mg/dL — ABNORMAL HIGH (ref 65–99)
Potassium: 3.4 mmol/L — ABNORMAL LOW (ref 3.5–5.1)
Sodium: 139 mmol/L (ref 135–145)
Total Bilirubin: 0.8 mg/dL (ref 0.3–1.2)
Total Protein: 5.9 g/dL — ABNORMAL LOW (ref 6.5–8.1)

## 2014-12-27 LAB — URINALYSIS, ROUTINE W REFLEX MICROSCOPIC
Bilirubin Urine: NEGATIVE
Glucose, UA: NEGATIVE mg/dL
Ketones, ur: NEGATIVE mg/dL
Nitrite: POSITIVE — AB
Protein, ur: NEGATIVE mg/dL
Specific Gravity, Urine: 1.011 (ref 1.005–1.030)
pH: 6.5 (ref 5.0–8.0)

## 2014-12-27 LAB — PROTIME-INR
INR: 2.36 — ABNORMAL HIGH (ref 0.00–1.49)
Prothrombin Time: 25.6 seconds — ABNORMAL HIGH (ref 11.6–15.2)

## 2014-12-27 LAB — GLUCOSE, CAPILLARY
Glucose-Capillary: 149 mg/dL — ABNORMAL HIGH (ref 65–99)
Glucose-Capillary: 206 mg/dL — ABNORMAL HIGH (ref 65–99)
Glucose-Capillary: 202 mg/dL — ABNORMAL HIGH (ref 65–99)

## 2014-12-27 LAB — HEMOGLOBIN A1C
Est. average glucose Bld gHb Est-mCnc: 189 mg/dL
Hgb A1c MFr Bld: 8.2 % — ABNORMAL HIGH (ref 4.8–5.6)

## 2014-12-27 LAB — URINE MICROSCOPIC-ADD ON

## 2014-12-27 MED ORDER — SODIUM CHLORIDE 0.9 % IV SOLN
510.0000 mg | Freq: Once | INTRAVENOUS | Status: AC
  Administered 2014-12-27: 510 mg via INTRAVENOUS
  Filled 2014-12-27: qty 17

## 2014-12-27 MED ORDER — WARFARIN SODIUM 2.5 MG PO TABS
2.5000 mg | ORAL_TABLET | Freq: Once | ORAL | Status: AC
  Administered 2014-12-27: 2.5 mg via ORAL
  Filled 2014-12-27: qty 1

## 2014-12-27 MED ORDER — WARFARIN SODIUM 2.5 MG PO TABS: 2.5000 mg | ORAL_TABLET | Freq: Once | ORAL | Status: DC

## 2014-12-27 MED ORDER — WARFARIN - PHYSICIAN DOSING INPATIENT: Freq: Every day | Status: DC

## 2014-12-27 MED ORDER — FUROSEMIDE 80 MG PO TABS: 80.0000 mg | ORAL_TABLET | Freq: Every day | ORAL | Status: DC

## 2014-12-27 NOTE — Discharge Summary (Signed)
Name: [name] MRN: [id number] DOB: [birth year] 64 y.o. PCP: [name], MD  Date of Admission: 12/26/2014 12:01 PM Date of Discharge: 12/29/2014 Attending Physician: No att. providers found  Discharge Diagnosis: 1.  Principal Problem:   Symptomatic anemia Active Problems:   Major depression, chronic (HCC)   Anxiety   Gastroesophageal reflux disease   Type 2 diabetes mellitus with diabetic neuropathy (HCC)   Pulmonary emphysema (HCC)   Fibromyalgia   Chronic congestive heart failure with left ventricular diastolic dysfunction (HCC)   Paroxysmal atrial fibrillation (HCC)   Tobacco abuse   Internal and external hemorrhoids without complication   Long term (current) use of anticoagulants   Chronic low back pain   Chronic bronchitis (HCC)  Discharge Medications:   Medication List    TAKE these medications        ACCU-CHEK FASTCLIX LANCET Kit  Check your blood 4 times a day dx code 250.00 insulin requiring     albuterol 108 (90 BASE) MCG/ACT inhaler  Commonly known as:  PROAIR HFA  Inhale 1-2 puffs into the lungs every 6 (six) hours as needed for wheezing or shortness of breath.     alendronate 70 MG tablet  Commonly known as:  FOSAMAX  Take 1 tablet (70 mg total) by mouth every 7 (seven) days. Take with a full glass of water on an empty stomach.     ALPRAZolam 1 MG tablet  Commonly known as:  XANAX  Take 0.5-1 tablets (0.5-1 mg total) by mouth 2 (two) times daily as needed for anxiety.     buPROPion 100 MG tablet  Commonly known as:  WELLBUTRIN  Take 1 tablet (100 mg total) by mouth 2 (two) times daily.     Calcium Carb-Cholecalciferol 600-800 MG-UNIT Tabs  Take 1 tablet by mouth 2 (two) times daily.     chlorpheniramine 4 MG tablet  Commonly known as:  CHLOR-TRIMETON  Take 1 tablet (4 mg total) by mouth 3 (three) times daily as needed for allergies.     diclofenac sodium 1 % Gel  Commonly known as:  VOLTAREN  Apply 2 g topically 4 (four) times  daily.     EASY TOUCH PEN NEEDLES 31G X 5 MM Misc  Generic drug:  Insulin Pen Needle  USE TO INJECT INSULIN 5 TIMES DAILY     fluticasone 50 MCG/ACT nasal spray  Commonly known as:  FLONASE  Place 2 sprays into both nostrils daily.     Fluticasone-Salmeterol 250-50 MCG/DOSE Aepb  Commonly known as:  ADVAIR DISKUS  Inhale 1 puff into the lungs 2 (two) times daily.     furosemide 80 MG tablet  Commonly known as:  LASIX  Take 1 tablet (80 mg total) by mouth daily.     gabapentin 300 MG capsule  Commonly known as:  NEURONTIN  Take 1 capsule (300 mg total) by mouth 3 (three) times daily.     HYDROcodone-acetaminophen 5-325 MG tablet  Commonly known as:  NORCO/VICODIN  Take 1-2 tablets by mouth every 6 (six) hours as needed for severe pain.     insulin aspart cartridge  Commonly known as:  NOVOLOG  Inject 18 Units into the skin 3 (three) times daily with meals.     Insulin Detemir 100 UNIT/ML Pen  Commonly known as:  LEVEMIR  Inject 45 Units into the skin 2 (two) times daily.     lidocaine 2 % jelly  Commonly known as:  XYLOCAINE  APPLY TO AFFECTED AREA THREE TIMES  A DAY     nystatin cream  Commonly known as:  MYCOSTATIN  Apply 1 application topically 2 (two) times daily.     omeprazole 40 MG capsule  Commonly known as:  PRILOSEC  Take 1 capsule (40 mg total) by mouth 2 (two) times daily.     ONETOUCH VERIO test strip  Generic drug:  glucose blood  Check blood sugar up to 4 times a day     ONETOUCH VERIO W/DEVICE Kit  1 each by Does not apply route 4 (four) times daily.     potassium chloride SA 20 MEQ tablet  Commonly known as:  K-DUR,KLOR-CON  Take 1 tablet (20 mEq total) by mouth 2 (two) times daily.     rosuvastatin 20 MG tablet  Commonly known as:  CRESTOR  Take 1 tablet (20 mg total) by mouth at bedtime.     sertraline 100 MG tablet  Commonly known as:  ZOLOFT  Take 2 tablets (200 mg total) by mouth daily.     tiotropium 18 MCG inhalation capsule    Commonly known as:  SPIRIVA HANDIHALER  Place 1 capsule (18 mcg total) into inhaler and inhale daily.     warfarin 5 MG tablet  Commonly known as:  COUMADIN  Take 1/2 tablet (2.5 mg) daily except on Mondays take 1 tablet (5 mg)        Disposition and follow-up:   [name] was discharged from [facility] in Stable condition.  At the hospital follow up visit please address:  1.  Repeat upper endoscopy might be indicated for duodenal biopsies to rule out adult celiac disease and gastric biopsies to rule out Helicobacter infection.  Dose of Lasix decreased from 80 mg BID to 80 mg QD upon discharge.   2.  Labs / imaging needed at time of follow-up: CBC, BMP, H. pylori stool antigen   3.  Pending labs/ test needing follow-up:  Follow-up Appointments: Follow-up Information    Follow up with Fatima Sanger, MD. Go on 01/01/2015.   Specialty:  Internal Medicine   Why:  Follow up appointment at 9:15 am.    Contact information:   [address] [place] [address] [phone]       Discharge Instructions: Discharge Instructions    AMB Referral to [employer] [facility]    Complete by:  As directed   Reason for consult:  3rd admission, was with [facility] previously  Diagnoses of:  COPD/ Pneumonia  Expected date of contact:  1-3 days (reserved for hospital discharges)  Please assign to community nurse for transition of care calls and assess for home visits. 3rd admission.  Was assigned from High risk Tier 4 list to [employer].  coach. Questions please call: Natividad Brood, RN BSN [facility] Liaison  [phone] business mobile phone     Diet - low sodium heart healthy    Complete by:  As directed      Increase activity slowly    Complete by:  As directed            Consultations: Treatment Team:  [name], MD  Procedures Performed:  No results found.    Admission HPI: Patient is a 64 yo F with a PMHx of COPD, pulmonary HTN,  CHF, anemia of chronic disease, GERD, adenomatous polyps, internal hemorrhoids, paroxysmal atrial fibrillation, Type II DM, clear cell renal cell carcinoma, aortic arthrosclerosis presenting to the ED to receive a blood transfusion. Patient states she  went to her PCP (our clinic) for a routine checkup yesterday and was sent here today after her blood work showed low Hgb 5.6. Hgb was 9.4 in 10/2014. She reports feeling weak/ tired and feeling short of breath with activity. It was not clear from the history how long the patient has been having these symptoms. Does report eating ice all the time. Denies having any dizziness or CP. Reports having loose stools for the past 1 year. Denies having any hematochezia, melena, hematemesis, or hematuria. Patient does report having a history of GERD and states she is taking Omeprazole twice daily. Patient states she has been chronically anemic and has seen several doctors in they past including GI and hematology. As per patient, no one could identify why she was anemic. Patient does report having a history of kidney cancer and states interventional radiology "froze" the kidney 3 years ago.  Hospital Course by problem list: Principal Problem:   Symptomatic anemia Active Problems:   Major depression, chronic (HCC)   Anxiety   Gastroesophageal reflux disease   Type 2 diabetes mellitus with diabetic neuropathy (HCC)   Pulmonary emphysema (HCC)   Fibromyalgia   Chronic congestive heart failure with left ventricular diastolic dysfunction (HCC)   Paroxysmal atrial fibrillation (HCC)   Tobacco abuse   Internal and external hemorrhoids without complication   Long term (current) use of anticoagulants   Chronic low back pain   Chronic bronchitis (HCC)   Symptomatic IDA Patient has a history of chronic anemia and pica. Hgb 5.6 on admission, was 9.4 in 10/2014. Ferritin low (8). FOBT negative. EGD from 05/2011 was normal and colonoscopy at the time showing internal  hemorrhoids. Patient reported having capsule study done but we do not have records as she is not sure when or where it was done. She was transfused with 2u PRBCs and received a Feraheme transfusion. Patient was anxious to go home and did not want to be seen by GI either as inpatient or outpatient because she believes GI workup in the past has not explained her anemia. On the day of discharge, patient was hemodynamically stable, Hgb 7.8, and patient was not symptomatic. She denied having any dizziness/ lightheadedness, CP, SOB, or abdominal pain. She has a follow up appointment at our clinic on 01/01/15. Repeat upper endoscopy might be indicated for duodenal biopsies to rule out adult celiac disease and gastric biopsies to rule out Helicobacter infection.  Lower extremity edema: Echo from 08/2012 showing LVEF 55-60%. Lungs were clear to auscultation but patient did have +1 pitting edema of b/l LEs. Likely secondary to low oncotic pressure from low Hgb count.Patient was on Lasix 80 mg BID at home. We held it during this hospitalization. Lasix was resumed upon discharge at a lower dose (80 mg QD).    Discharge Vitals:   BP 129/61 mmHg  Pulse 73  Temp(Src) 97.8 F (36.6 C) (Oral)  Resp 20  Ht _0  (1.575 m)  Wt 187 lb 4.8 oz (84.959 kg)  BMI 34.25 kg/m2  SpO2 98%  Discharge Labs:  No results found. However, due to the size of the patient record, not all encounters were searched. Please check Results Review for a complete set of results.  Signed: Shela Leff, MD 12/29/2014, 5:17 PM    Services Ordered on Discharge:  Equipment Ordered on Discharge:

## 2014-12-27 NOTE — Progress Notes (Signed)
Subjective: Patient was seen and examined at bedside today. States she is doing well after receiving the blood transfusions . Denies having any dizziness/ lightheadedness, CP, SOB, or abdominal pain. States she is not having muscle cramps anymore. Patient states she does not want to see gastroenterology at the hospital or as outpatient because she has seen them several times in the past and she believes tests including EGD, colonoscopy, and capsule study could not explain the reason for her anemia. We do not have records for the capsule endoscopy and patient is not sure about when/ how long ago it was done.   Objective: Vital signs in last 24 hours: Filed Vitals:   12/27/14 0423 12/27/14 0810 12/27/14 1103 12/27/14 1120  BP: 138/44 121/67  139/40  Pulse: 79 77  82  Temp: 98.5 F (36.9 C) 98.3 F (36.8 C)  98 F (36.7 C)  TempSrc: Oral Oral  Oral  Resp: _0 Height:      Weight: 187 lb 4.8 oz (84.959 kg)     SpO2: 100% 98% 96% 97%   Weight change:   Intake/Output Summary (Last 24 hours) at 12/27/14 1444 Last data filed at 12/27/14 1123  Gross per 24 hour  Intake   1049 ml  Output   1350 ml  Net   -301 ml   Physical Exam: Constitutional: She is oriented to person, place, and time. She appears well-developed and well-nourished. .  Eyes: Conjunctival pallor  Cardiovascular: Normal rate, regular rhythm and intact distal pulses.  Pulmonary/Chest: Effort normal and breath sounds normal. She has no wheezes. She has no rales.  Abdominal: Soft. Bowel sounds are normal. She exhibits no distension. There is no tenderness. There is no rebound and no guarding.  Musculoskeletal: She exhibits no edema.  Neurological: She is alert and oriented to person, place, and time.  Skin: Skin is warm and dry.   Lab Results: Basic Metabolic Panel:  Recent Labs Lab 12/26/14 1150 12/27/14 0258  NA 136 139  K 3.9 3.4*  CL 102 104  CO2 26 28  GLUCOSE 179* 173*  BUN 16 14  CREATININE  1.19* 1.07*  CALCIUM 8.8* 9.0   Liver Function Tests:  Recent Labs Lab 12/27/14 0258  AST 15  ALT 13*  ALKPHOS 54  BILITOT 0.8  PROT 5.9*  ALBUMIN 3.2*   CBC:  Recent Labs Lab 12/25/14 1138  12/26/14 1150 12/26/14 2145 12/27/14 0258  WBC 6.7  < > 6.8 7.2 7.5  NEUTROABS 5.0  --  4.8  --   --   HGB  --   < > 5.6* 8.3* 7.8*  HCT 19.0*  < > 19.7* 27.6* 25.8*  MCV  --   < > 75.2* 79.3 79.1  PLT  --   < > 280 259 242  < > = values in this interval not displayed.  CBG:  Recent Labs Lab 12/25/14 1036 12/26/14 1859 12/26/14 2150 12/27/14 0650 12/27/14 1123  GLUCAP 217* 167* 193* 149* 206*   Hemoglobin A1C:  Recent Labs Lab 12/25/14 1138  HGBA1C 8.2*   Thyroid Function Tests:  Recent Labs Lab 12/25/14 1138  TSH 1.080   Anemia Panel:  Recent Labs Lab 12/25/14 1138  FERRITIN 8*   Urine Drug Screen: Drugs of Abuse     Component Value Date/Time   LABOPIA POSITIVE* 01/07/2013 2114   COCAINSCRNUR NONE DETECTED 01/07/2013 2114   LABBENZ NONE DETECTED 01/07/2013 2114   AMPHETMU NONE DETECTED 01/07/2013 2114  THCU NONE DETECTED 01/07/2013 2114   LABBARB NONE DETECTED 01/07/2013 2114   Micro Results: No results found for this or any previous visit (from the past 240 hour(s)). Studies/Results: No results found. Medications: I have reviewed the patient's current medications. Scheduled Meds: . buPROPion  100 mg Oral BID  . calcium carbonate  1 tablet Oral BID WC  . cholecalciferol  800 Units Oral Daily  . fluticasone  2 spray Each Nare Daily  . gabapentin  300 mg Oral TID  . insulin aspart  0-5 Units Subcutaneous QHS  . insulin aspart  0-9 Units Subcutaneous TID WC  . insulin detemir  25 Units Subcutaneous BID  . mometasone-formoterol  2 puff Inhalation BID  . nicotine  21 mg Transdermal Q2000  . pantoprazole (PROTONIX) IV  40 mg Intravenous Q12H  . potassium chloride SA  20 mEq Oral BID  . rosuvastatin  20 mg Oral QHS  . sertraline  200 mg Oral  Daily  . sodium chloride  3 mL Intravenous Q12H  . sodium chloride  3 mL Intravenous Q12H  . tiotropium  18 mcg Inhalation Daily   Continuous Infusions:  PRN Meds:.albuterol, ALPRAZolam, HYDROcodone-acetaminophen Assessment/Plan: Principal Problem:   Symptomatic anemia Active Problems:   Major depression, chronic (HCC)   Anxiety   Gastroesophageal reflux disease   Type 2 diabetes mellitus with diabetic neuropathy (HCC)   Pulmonary emphysema (HCC)   Fibromyalgia   Chronic congestive heart failure with left ventricular diastolic dysfunction (HCC)   Paroxysmal atrial fibrillation (HCC)   Tobacco abuse   Internal and external hemorrhoids without complication   Long term (current) use of anticoagulants   Chronic low back pain   Chronic bronchitis (HCC)  Symptomatic IDA Patient has a history of chronic anemia and pica. Hgb 5.6 , was 9.4 in 10/2014. Ferritin low (8). FOBT done in the ED was negative. EGD from 05/2011 was normal and colonoscopy at the time showing internal hemorrhoids. Patient reports having capsule study done but we do not have records. She is not sure when it was done. Patient received 2u PRBCs yesterday. Hgb 7.8 now. She does not want to be seen by GI either as inpatient or outpatient because she believes workup in the past has not explained her anemia. At present denies having any dizziness/ lightheadedness, CP, SOB, or abdominal pain. She is currently hemodynamically stable and will likely be discharged today. Pt is recommending intermittent supervision - I spoke to the patient and she told me her granddaughter is there to supervise/ assist her at home.  -Feraheme transfusion because patient's anemia could likely be due to iron malabsorption.  -Urine dipstick was negative for blood. UA ordered today to look for possible hematuria.   Lower extremity edema: Echo from 08/2012 showing LVEF 55-60%. Lungs sound clear but patient does have +1 pitting edema of b/l LEs. Likely 2/2  low oncotic pressure from low Hgb count.   -Holding home Lasix 80 mg BID for now. Patient can resume lasix on discharge but decreased to 80 mg qd. She has an appointment at our clinic on 11/21. Will defer to PCP if they want to continue her on her original dose of Lasix.  -continue K supplementation since patient gets leg cramps  -daily weights -strict I/Os  DMII -Levemir 25u BID -SSI  Anxiety -continue Alprazolam 0.5-1 mg BID  Chronic low back pain  -continue Norco 5-325 1-2 tabs q6 -continue Voltaren gel qid  COPD: currently satting 97-100% on 2L via [place]. Patient reports being  on home oxygen. -continue Albuterol prn -continue Dulera BID -continue Spiriva daily   Depression -continue Buproprion 100 mg BID -continue Sertraline 200 mg daily  Neuropathy -continue Gabapentin 300 mg TID  Tobacco use -Nicotine patch   HLD -continue Crestor 20 mg QHS  GERD -Protonix 40 mg IV q12   DVT ppx: SCDs  Diet: carb modified   Code: FULL  Dispo: Disposition is deferred at this time, awaiting improvement of current medical problems.  Anticipated discharge in approximately 0-1 day(s).   The patient does have a current PCP [name], MD) and does need an [facility] hospital follow-up appointment after discharge.  The patient does not have transportation limitations that hinder transportation to clinic appointments.  .Services Needed at time of discharge: Y = Yes, Blank = No PT:   OT:   RN:   Equipment:   Other:     LOS: 1 day   Shela Leff, MD 12/27/2014, 2:44 PM

## 2014-12-27 NOTE — Progress Notes (Signed)
Patient's home CPAP set up and ready at patient's bedside. Patient states that she will place it on when ready. RT will continue to monitor.

## 2014-12-27 NOTE — Evaluation (Signed)
Occupational Therapy Evaluation Patient Details Name: [name] MRN: [id number] DOB: 21-Mar-1950 Today's Date: 12/27/2014    History of Present Illness Patient reports her family physician contacted her to come to the emergency department for blood transfusion she reports she had her labs drawn yesterday and her hemoglobin came back very low. She reports she is chronically short of breath and weak. She states this may have been somewhat more pronounced over the past few days to weeks. She is chronically on oxygen for COPD. She reports she also gets lower extremity swelling that is chronic. She states she has not taken her Lasix yet today and her legs are somewhat more swollen. The patient reports she has been anemic in the past she cannot recall how long ago was that she required a blood transfusion. She does report she has had endoscopies and there is never been a GI source identified   Clinical Impression   Pt was performing ADL and mobility at a modified independent prior to admission.  Pt is performing at her baseline.  No OT needs. All DME are met at home.    Follow Up Recommendations  No OT follow up    Equipment Recommendations  None recommended by OT    Recommendations for Other Services       Precautions / Restrictions Precautions Precautions: Fall Restrictions Weight Bearing Restrictions: No      Mobility Bed Mobility Overal bed mobility: Modified Independent             General bed mobility comments: HOB up  Transfers Overall transfer level: Independent Equipment used: None             General transfer comment: from bed in lowest position and from standard toilet    Balance Overall balance assessment: Needs assistance Sitting-balance support: No upper extremity supported;Feet supported Sitting balance-Leahy Scale: Good     Standing balance support: No upper extremity supported;During functional activity Standing balance-Leahy Scale:  Fair Standing balance comment: Pt with good static standing balance.              High level balance activites: Direction changes;Turns;Sudden stops High Level Balance Comments: supervision for challenges without device.             ADL Overall ADL's : Modified independent                                       General ADL Comments: Demonstrated ability to toilet, groom, perform dressing and refilled the water in her CPAP machine at a modified independent level.     Vision     Perception     Praxis      Pertinent Vitals/Pain Pain Assessment: 0-10 Pain Score: 9  Pain Location: head Pain Descriptors / Indicators: Aching Pain Intervention(s): Limited activity within patient's tolerance;Monitored during session;Repositioned;Patient requesting pain meds-RN notified     Hand Dominance Right   Extremity/Trunk Assessment Upper Extremity Assessment Upper Extremity Assessment: Generalized weakness   Lower Extremity Assessment Lower Extremity Assessment: Generalized weakness   Cervical / Trunk Assessment Cervical / Trunk Assessment: Normal   Communication Communication Communication: No difficulties   Cognition Arousal/Alertness: Awake/alert Behavior During Therapy: WFL for tasks assessed/performed Overall Cognitive Status: Within Functional Limits for tasks assessed                     General Comments  Exercises       Shoulder Instructions      Home Living Family/patient expects to be discharged to:: Private residence Living Arrangements: Other relatives Available Help at Discharge: Family;Available 24 hours/day Type of Home: House Home Access: Stairs to enter [employer] of Steps: 1 Entrance Stairs-Rails: None Home Layout: One level     Bathroom Shower/Tub: Teacher, early years/pre: Handicapped height Bathroom Accessibility: Yes   Home Equipment: Environmental consultant - 2 wheels;Cane - single point;Bedside  commode;Walker - 4 wheels;Grab bars - toilet;Grab bars - tub/shower;Shower seat   Additional Comments: home O2 that pt wears at night and when she goes out      Prior Functioning/Environment Level of Independence: Independent with assistive device(s)        Comments: Reports her granddaughter would help at home    OT Diagnosis: Generalized weakness;Acute pain   OT Problem List:     OT Treatment/Interventions:      OT Goals(Current goals can be found in the care plan section) Acute Rehab OT Goals Patient Stated Goal: to go home  OT Frequency:     Barriers to D/C:            Co-evaluation              End of Session    Activity Tolerance: Patient limited by fatigue Patient left: in bed;with call bell/phone within reach   Time: 1511-1530 OT Time Calculation (min): 19 min Charges:  OT General Charges $OT Visit: 1 Procedure OT Evaluation $Initial OT Evaluation Tier I: 1 Procedure G-Codes:    Malka So 12/27/2014, 3:40 PM  [phone]

## 2014-12-27 NOTE — Consult Note (Signed)
   [facility] CM Inpatient Consult   12/27/2014  [name] 02-26-1950 [id number] Patient evaluated for community based chronic disease management services with [name] as a benefit of patient's [employer] Medicare Insurance. Met with patient at bedside to explain [facility] services. Patient had been on the referral high risk list, as well.  Patient admitted with HF exacerbation and history of Emphysema with Tobacco Abuse. Consent form signed.   Patient will receive post hospital discharge call and will be evaluated for monthly home visits for assessments and disease process education.  Left contact information and [facility] literature at bedside. Will make Inpatient Case Manager aware that [name] [facility] following. Of note, [facility] Care Management services does not replace or interfere with any services that are arranged by inpatient case management or social work.  For additional questions or referrals please contact:   Natividad Brood, RN BSN [facility] Liaison  [phone] business mobile phone

## 2014-12-27 NOTE — Clinical Documentation Improvement (Signed)
Internal Medicine  Possible Conditions?   Respiratory Failure (Document Acuity - Acute, Chronic, Acute on Chronic)  Other  Clinically Undetermined  Document any associated diagnoses/conditions.  Please update your documentation within the medical record to reflect your response to this query. Thank you.  Supporting Information:(As per notes) "COPD: currently satting 97-100% on 2L via [place]. Patient reports being on home oxygen."  -continue Albuterol prn  -continue Dulera BID  -continue Spiriva daily   Please exercise your independent, professional judgment when responding. A specific answer is not anticipated or expected.  Thank You, Alessandra Grout, RN, BSN, CCDS,Clinical Documentation Specialist:  [phone]  [phone]=Cell [place]- Health Information Management

## 2014-12-27 NOTE — Evaluation (Addendum)
Physical Therapy Evaluation Patient Details Name: [name] MRN: [id number] DOB: June 04, 1950 Today's Date: 12/27/2014   History of Present Illness  Patient reports her family physician contacted her to come to the emergency department for blood transfusion she reports she had her labs drawn yesterday and her hemoglobin came back very low. She reports she is chronically short of breath and weak. She states this may have been somewhat more pronounced over the past few days to weeks. She is chronically on oxygen for COPD. She reports she also gets lower extremity swelling that is chronic. She states she has not taken her Lasix yet today and her legs are somewhat more swollen. The patient reports she has been anemic in the past she cannot recall how long ago was that she required a blood transfusion. She does report she has had endoscopies and there is never been a GI source identified  Clinical Impression  Pt admitted with above diagnosis. Pt currently with functional limitations due to the deficits listed below (see PT Problem List). Pt ambulating close to baseline.  Will follow acutely.   Pt will benefit from skilled PT to increase their independence and safety with mobility to allow discharge to the venue listed below.      Follow Up Recommendations No PT follow up;Supervision - Intermittent    Equipment Recommendations  None recommended by PT    Recommendations for Other Services       Precautions / Restrictions Precautions Precautions: Fall Restrictions Weight Bearing Restrictions: No      Mobility  Bed Mobility Overal bed mobility: Independent                Transfers Overall transfer level: Independent Equipment used: None                Ambulation/Gait Ambulation/Gait assistance: Supervision Ambulation Distance (Feet): 150 Feet Assistive device: None Gait Pattern/deviations: Step-through pattern;Decreased stride length   Gait velocity interpretation:  Below normal speed for age/gender General Gait Details: Pt able to ambulate with steady gait.  Min challenges given and pt can maintain balance.  No LOB without device.  Pt appears to be at baseline per conversation.  Pt states she has had therapy but isnt really into doing exercise.  Will follow while in hospital but do not recommend f/u.   Stairs            Wheelchair Mobility    Modified Rankin (Stroke Patients Only)       Balance Overall balance assessment: Needs assistance Sitting-balance support: No upper extremity supported;Feet supported Sitting balance-Leahy Scale: Good     Standing balance support: No upper extremity supported;During functional activity Standing balance-Leahy Scale: Fair Standing balance comment: Pt with good static standing balance.              High level balance activites: Direction changes;Turns;Sudden stops High Level Balance Comments: supervision for challenges without device.              Pertinent Vitals/Pain Pain Assessment: No/denies pain See sats above with activity.    Home Living Family/patient expects to be discharged to:: Private residence Living Arrangements: Other relatives Available Help at Discharge: Family;Available 24 hours/day Type of Home: House Home Access: Stairs to enter Entrance Stairs-Rails: None Entrance Stairs-Number of Steps: 1 Home Layout: One level Home Equipment: Walker - 2 wheels;Cane - single point;Bedside commode;Walker - 4 wheels;Grab bars - toilet;Grab bars - tub/shower;Shower seat Additional Comments: home O2 that pt wears at night and when she goes  out    Prior Function Level of Independence: Independent         Comments: Reports her granddaughter would help at home     Hand Dominance   Dominant Hand: Right    Extremity/Trunk Assessment   Upper Extremity Assessment: Defer to OT evaluation           Lower Extremity Assessment: Generalized weakness      Cervical / Trunk  Assessment: Normal  Communication   Communication: No difficulties  Cognition Arousal/Alertness: Awake/alert Behavior During Therapy: WFL for tasks assessed/performed Overall Cognitive Status: Within Functional Limits for tasks assessed                      General Comments General comments (skin integrity, edema, etc.): Pt reports she doesn't need O2 however O2 sats with activity 86-90% on RA.      Exercises        Assessment/Plan    PT Assessment Patient needs continued PT services  PT Diagnosis Generalized weakness   PT Problem List Decreased activity tolerance;Decreased balance;Decreased mobility;Decreased knowledge of use of DME;Decreased safety awareness;Decreased knowledge of precautions;Cardiopulmonary status limiting activity  PT Treatment Interventions DME instruction;Gait training;Functional mobility training;Therapeutic activities;Therapeutic exercise;Balance training;Patient/family education;Stair training   PT Goals (Current goals can be found in the Care Plan section) Acute Rehab PT Goals Patient Stated Goal: to go home PT Goal Formulation: With patient Time For Goal Achievement: 01/10/15 Potential to Achieve Goals: Good    Frequency Min 3X/week   Barriers to discharge        Co-evaluation               End of Session Equipment Utilized During Treatment: Gait belt Activity Tolerance: Patient limited by fatigue Patient left: in bed;with call bell/phone within reach Nurse Communication: Mobility status         Time: 1214-1229 PT Time Calculation (min) (ACUTE ONLY): 15 min   Charges:   PT Evaluation $Initial PT Evaluation Tier I: 1 Procedure     PT G CodesDenice Paradise Jan 26, 2015, 3:34 PM  [facility] [phone] (pager)

## 2014-12-28 LAB — TYPE AND SCREEN
ABO/RH(D): A NEG
Antibody Screen: NEGATIVE
Unit division: 0
Unit division: 0
Unit division: 0

## 2014-12-29 ENCOUNTER — Other Ambulatory Visit: Payer: Self-pay | Admitting: *Deleted

## 2014-12-29 NOTE — Patient Outreach (Signed)
Referral received from hospital liaison, V. Brewer, for transition of care program.  Member recently hospitalized for symptomatic anemia, discharged 11/16.  Call placed to member [phone]) to initiate program, no answer.  HIPPA compliant voice message left.  Will await call back.  Will make second attempt to contact member next week.  Valente David, BSN, [place] [facility]  [facility] Care Manager [phone]

## 2014-12-29 NOTE — Progress Notes (Signed)
Patient ID: [name], female   DOB: Mar 18, 1950, 64 y.o.   MRN: [id number] Medicine attending discharge note: I interviewed and examined this patient on the day of discharge and I tested the accuracy of the evaluation and plan as recorded in the progress note dated 12/27/2014 by Dr. Shela Leff. Please see attending admission note dictated 12/27/2014 for summary of this patient's acute and chronic medical problems.   Condition is stable at time of discharge. Follow-up in our internal medicine clinic. There were no complications.

## 2015-01-01 ENCOUNTER — Ambulatory Visit: Payer: PPO | Admitting: Internal Medicine

## 2015-01-01 IMAGING — CT CT ABD-PELV W/ CM
2 of 5 series · 16 of 46 positions shown, 18 images · IV contrast (APPLIED)
Comparison: 03/05/2012and 10/23/2011.

CLINICAL DATA: Left upper quadrant abdominal pain for 3 days.
History of prior percutaneous cryoablation of a left renal
carcinoma.

CT ABDOMEN AND PELVIS WITH CONTRAST
TECHNIQUE: Multidetector CT imaging of the abdomen and pelvis was
performed following the standard protocol during bolus
administration of intravenous contrast.
Contrast: 80mL OMNIPAQUE IOHEXOL 300 MG/ML  SOLN

[Series 2: abd/ pelvis 5.0 i30f 1 · axial · 0.89mm/px · z∈[-797,-377]mm · 13 of 96 slices shown, 15 images]
[im 6/96  soft-tissue]
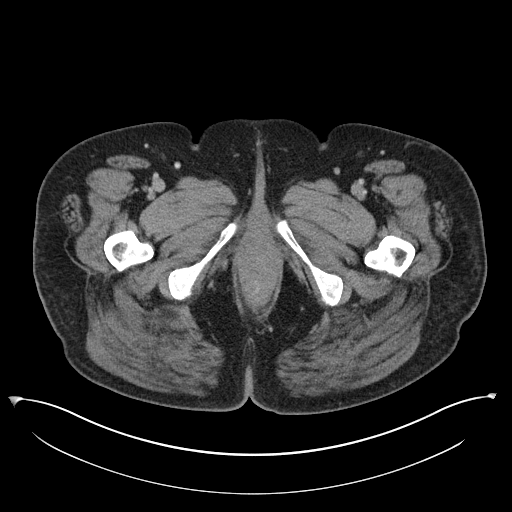
[im 6/96  bone]
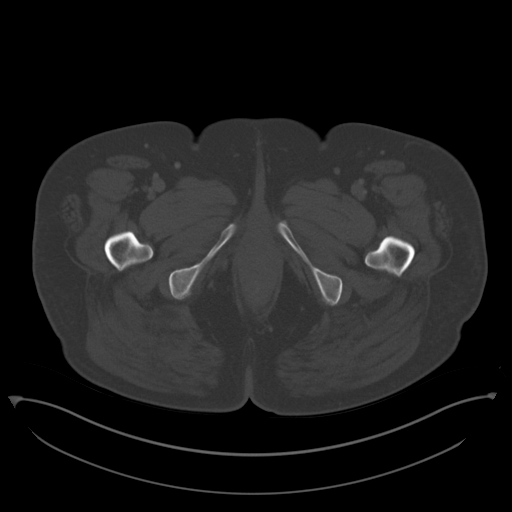
[im 12/96  soft-tissue]
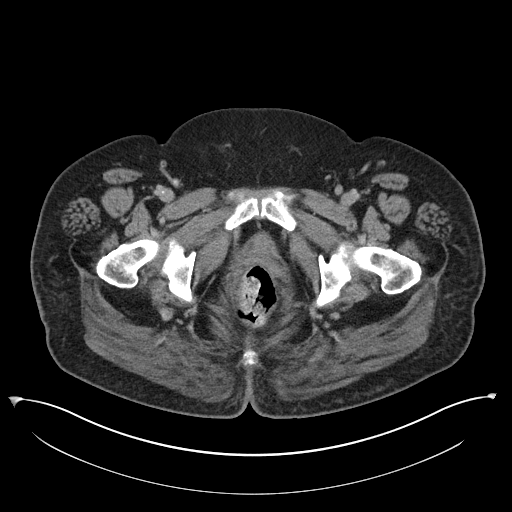
[im 23/96  soft-tissue]
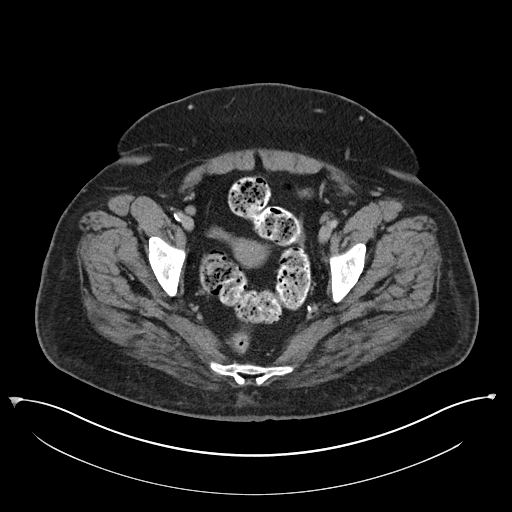
[im 28/96  soft-tissue]
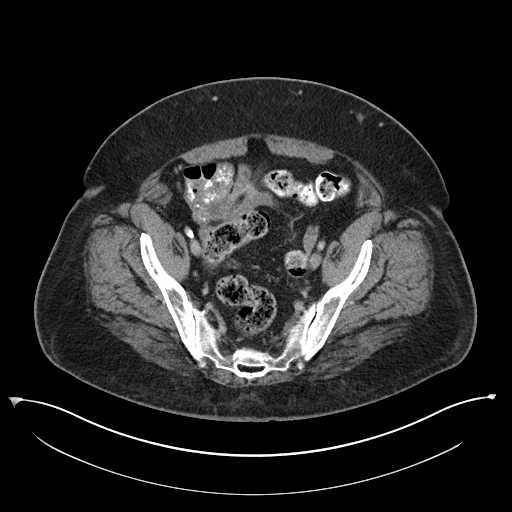
[im 34/96  soft-tissue]
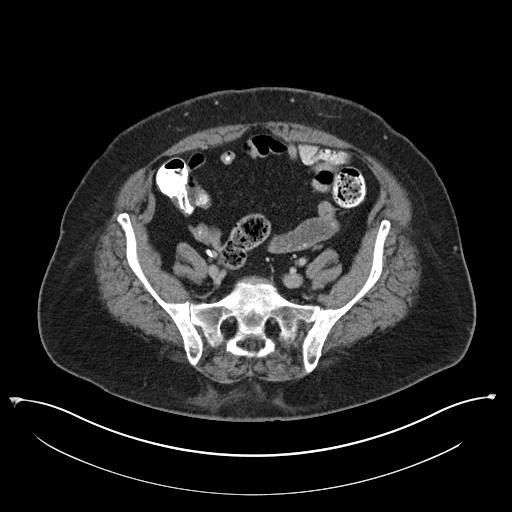
[im 40/96  soft-tissue]
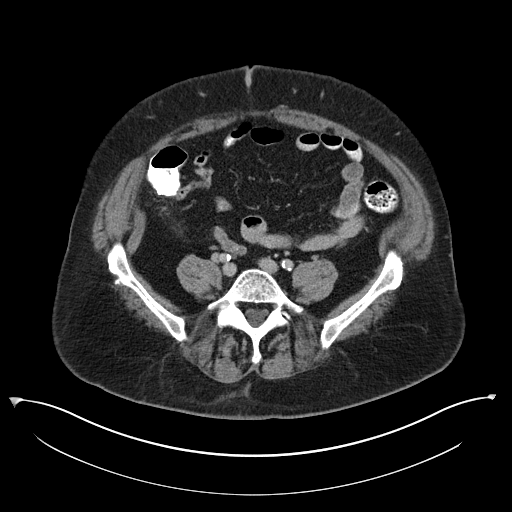
[im 51/96  soft-tissue]
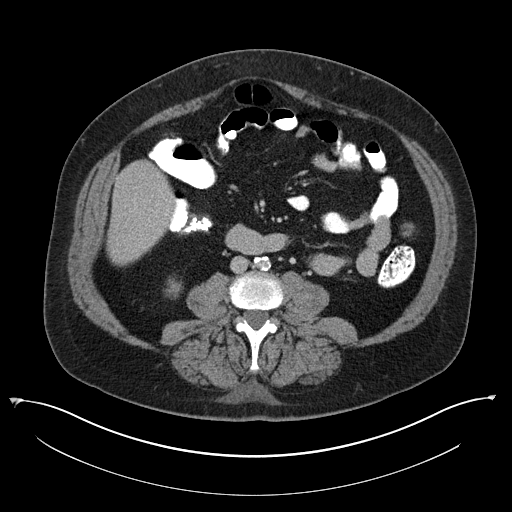
[im 56/96  soft-tissue]
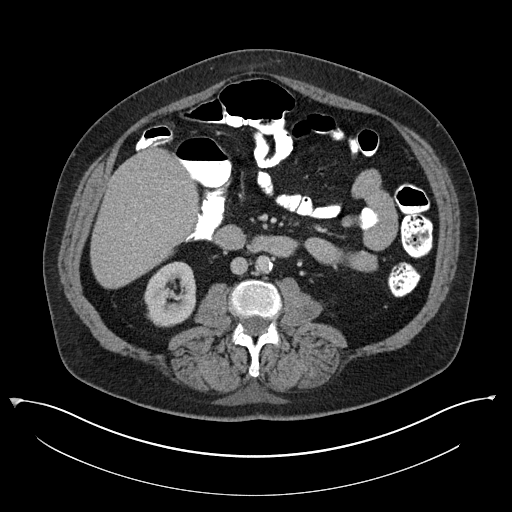
[im 62/96  soft-tissue]
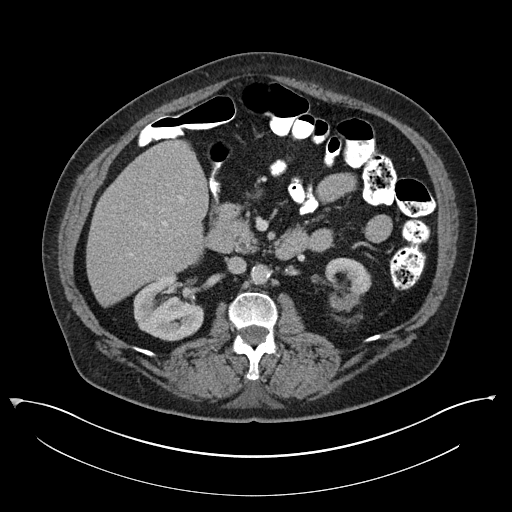
[im 62/96  bone]
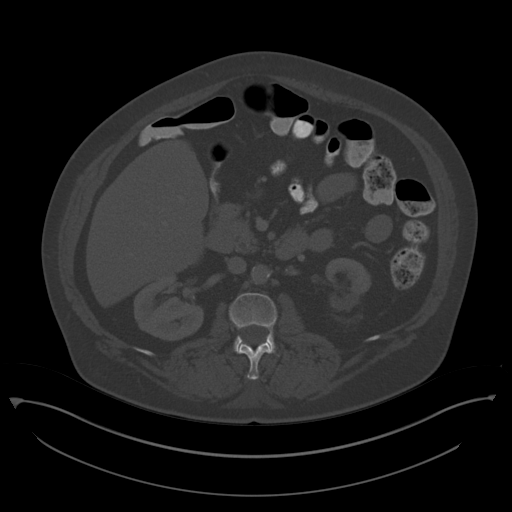
[im 68/96  soft-tissue]
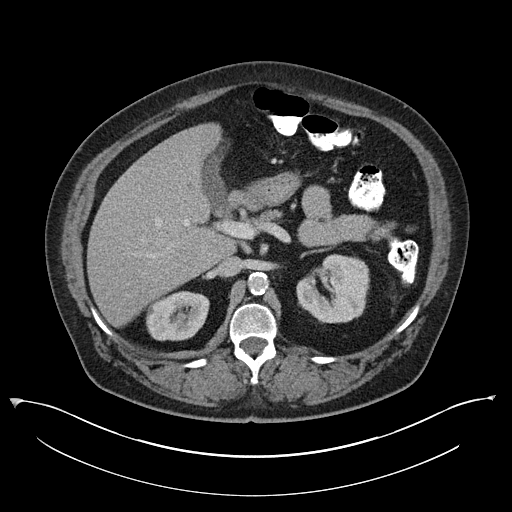
[im 73/96  soft-tissue]
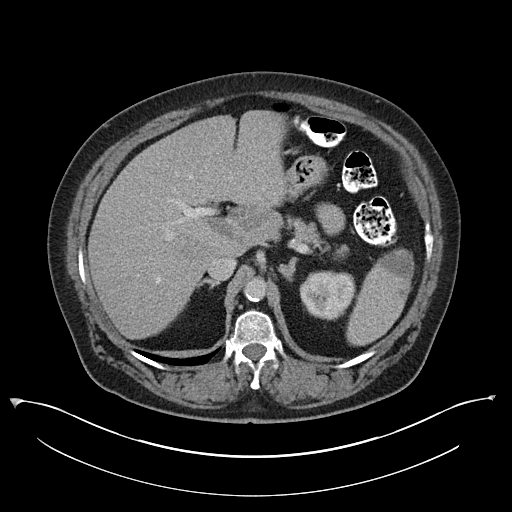
[im 84/96  soft-tissue]
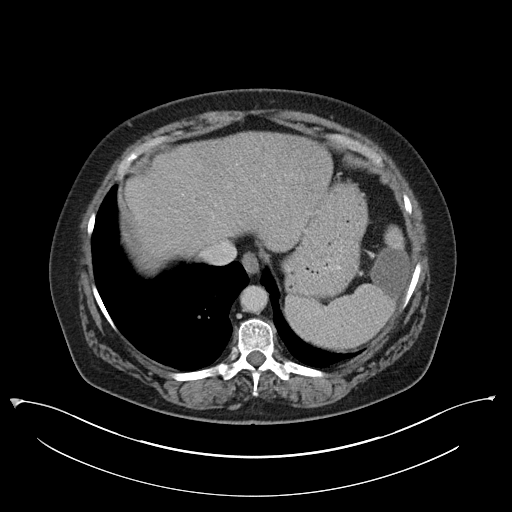
[im 90/96  soft-tissue]
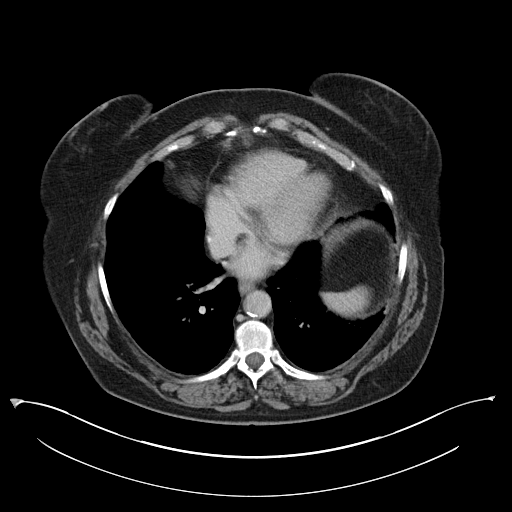

[Series 5: cor · coronal · 0.97mm/px · 3 of 149 slices shown]
[im 50/149  soft-tissue]
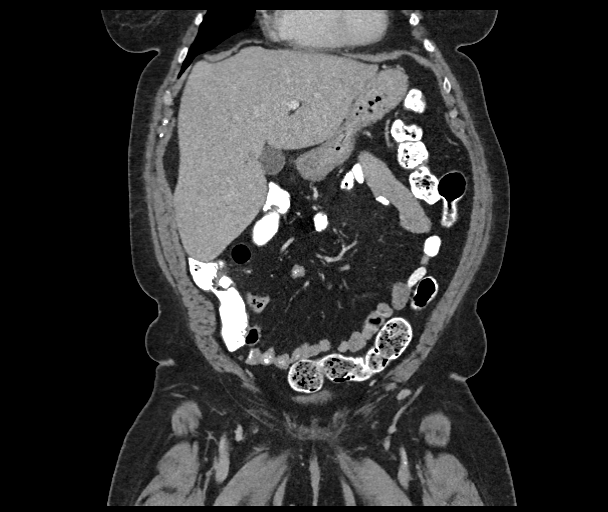
[im 66/149  soft-tissue]
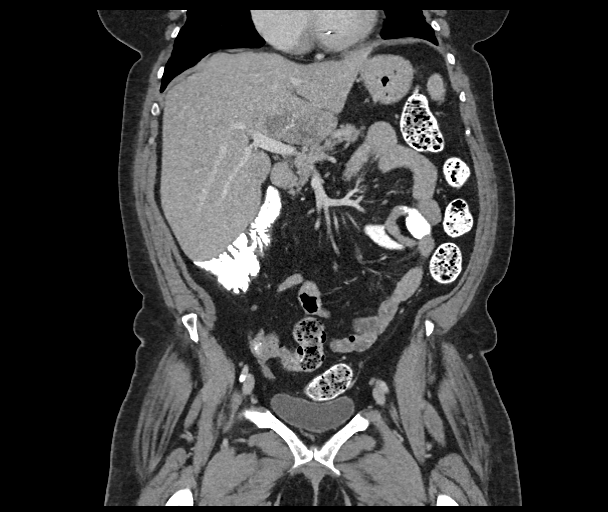
[im 83/149  soft-tissue]
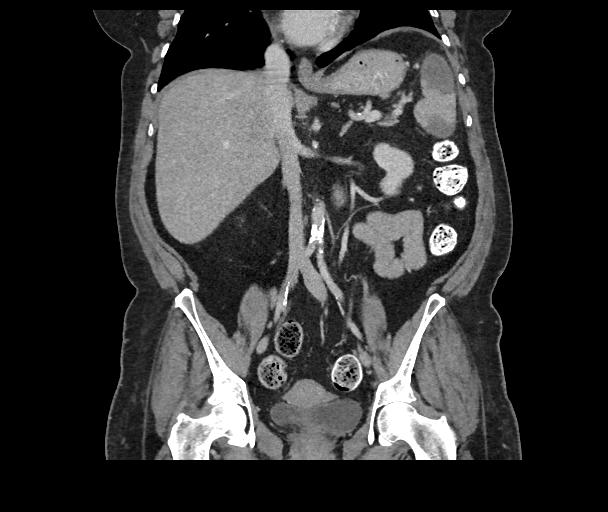

[16 of 46 positions shown; findings below may reference images not displayed]

FINDINGS: Interval development of a well-circumscribed splenic
infarct affecting the anterior spleen and involving roughly 20% of
the splenic volume.  No associated hemorrhage or abscess.  No
visualized splenic artery or vein thrombosis.

Post ablation changes involving the posterior lower pole of the
left kidney are again noted.  Ablation scar tissue shows
diminishment in size compared to the prior study with current
maximal dimensions of roughly 2.1 x 2.4 cm.  Ablated tissue shows
no evidence of enhancement to suggest recurrent tumor.  There is no
evidence of abnormal fluid collection or hydronephrosis.

Geographic fat in the liver shows similar appearance to a prior CT
on 10/23/2011. The gallbladder, pancreas, adrenal glands and bowel
are unremarkable.  No abnormal fluid collections, masses or
enlarged lymph nodes are seen.  No hernias are identified.  The
bladder is unremarkable.  No bony lesions are seen.
IMPRESSION: 1.  Acute splenic infarct affecting roughly 20% of the splenic
volume.  No associated splenic hemorrhage or abscess is seen.
2.  Further reduction in scar tissue volume at the site of prior
left renal ablation.  There is no evidence to suggest recurrent
renal carcinoma.  No metastatic disease is identified in the
abdomen or pelvis.

## 2015-01-02 ENCOUNTER — Other Ambulatory Visit: Payer: Self-pay | Admitting: Internal Medicine

## 2015-01-02 NOTE — Progress Notes (Signed)
CBC: Hgb < 5.5, WBC 6.7, Plts 283K, ferritin 8  Iron deficiency anemia.  She was admitted for transfusion.  Will discuss referral to GI for duodenal biopsy to assess for evidence of bacterial overgrowth, H. Pylori disease, or celiac disease.  She received iron supplementation parenterally, but will require oral supplementation thereafter until the cause of the iron deficiency is diagnosed and treated.  TSH 1.08

## 2015-01-02 NOTE — Telephone Encounter (Signed)
Spoke with pt, per [facility] rec[facility] rx.  I have now phoned it in for her.

## 2015-01-02 NOTE — Telephone Encounter (Signed)
Pt requesting fosamax to be filled.

## 2015-01-05 ENCOUNTER — Other Ambulatory Visit: Payer: Self-pay | Admitting: *Deleted

## 2015-01-05 NOTE — Patient Outreach (Signed)
First attempt made last week to initiate transition of care program.  Message left for member to call this care manager back.  Message received from member, asking for return call.  Call placed again this morning to make second attempt to start program, no answer.  HIPPA compliant voice message left.  Will await call back.  [name], BSN, [place] Management  [facility] Care Manager [phone]

## 2015-01-09 ENCOUNTER — Encounter: Payer: Self-pay | Admitting: *Deleted

## 2015-01-09 ENCOUNTER — Other Ambulatory Visit: Payer: Self-pay | Admitting: *Deleted

## 2015-01-09 ENCOUNTER — Other Ambulatory Visit: Payer: Self-pay | Admitting: Internal Medicine

## 2015-01-09 DIAGNOSIS — B373 Candidiasis of vulva and vagina: Secondary | ICD-10-CM

## 2015-01-09 DIAGNOSIS — E1140 Type 2 diabetes mellitus with diabetic neuropathy, unspecified: Secondary | ICD-10-CM

## 2015-01-09 DIAGNOSIS — Z794 Long term (current) use of insulin: Principal | ICD-10-CM

## 2015-01-09 DIAGNOSIS — B3731 Acute candidiasis of vulva and vagina: Secondary | ICD-10-CM

## 2015-01-09 NOTE — Patient Outreach (Signed)
Call received back from member.  Purpose of call and [facility] care management services explained.  Member states that she has been doing well since her discharge and do not feel the need to be involved in services at this time.  Benefits explained, including access to [facility] social worker and pharmacist, transition of care program and telephonic services offered again.  Member states that she is aware of how to manage her conditions independently, and that she has her niece staying with her for assistance.  Member encouraged to contact [facility] if her needs should change in the future.    Will make care management assistant and primary care physician aware of member's decision to decline services.  Valente David, BSN, [place] Management  [facility] Care Manager [phone]

## 2015-01-09 NOTE — Patient Outreach (Signed)
Third attempt made to contact member to initiate transition of care program, no answer, HIPPA compliant voice message left.  Member has attempted to make contact with the care manager after each message that was left.  For this reason, this care manager will wait for call back and make another attempt to contact patient later this week.  Valente David, BSN, [place] Management  [facility] Care Manager [phone]

## 2015-01-22 ENCOUNTER — Ambulatory Visit (INDEPENDENT_AMBULATORY_CARE_PROVIDER_SITE_OTHER): Payer: PPO | Admitting: Pharmacist

## 2015-01-22 ENCOUNTER — Encounter: Payer: PPO | Admitting: Internal Medicine

## 2015-01-22 DIAGNOSIS — Z7901 Long term (current) use of anticoagulants: Secondary | ICD-10-CM

## 2015-01-22 DIAGNOSIS — I480 Paroxysmal atrial fibrillation: Secondary | ICD-10-CM | POA: Diagnosis not present

## 2015-01-22 LAB — POCT INR: INR: 2.4

## 2015-01-22 NOTE — Progress Notes (Signed)
Anti-Coagulation Progress Note  [name] is a 64 y.o. female who is currently on an anti-coagulation regimen.    RECENT RESULTS: Recent results are below, the most recent result is correlated with a dose of 20 mg. per week: Lab Results  Component Value Date   INR 2.40 01/22/2015   INR 2.36* 12/27/2014   INR 2.40 12/04/2014    ANTI-COAG DOSE: Anticoagulation Dose Instructions as of 01/22/2015      [name] Tue Wed Thu Fri Sat   New Dose 2.5 mg 5 mg 2.5 mg 2.5 mg 2.5 mg 2.5 mg 2.5 mg       ANTICOAG SUMMARY: Anticoagulation Episode Summary    Current INR goal 2.0-3.0  Next INR check 03/12/2015  INR from last check 2.40 (01/22/2015)  Weekly max dose   Target end date   [facility]  Preferred lab   Send INR reminders to [place]   Indications  Paroxysmal atrial fibrillation ([place]) [I48.0] Long term (current) use of anticoagulants [Z79.01]        Comments       Anticoagulation Care Providers    Provider Role Specialty Phone number   Lelon Perla, MD  Cardiology [phone]      ANTICOAG TODAY: Anticoagulation Summary as of 01/22/2015    INR goal 2.0-3.0  Selected INR 2.40 (01/22/2015)  Next INR check 03/12/2015  Target end date    Indications  Paroxysmal atrial fibrillation ([place]) [I48.0] Long term (current) use of anticoagulants [Z79.01]      Anticoagulation Episode Summary    [facility]   Preferred lab    Send INR reminders to [place]   Comments     Anticoagulation Care Providers    Provider Role Specialty Phone number   Lelon Perla, MD  Cardiology [phone]      PATIENT INSTRUCTIONS: Patient Instructions  Patient instructed to take medications as defined in the Anti-coagulation Track section of this encounter.  Patient instructed to take today's dose.  Patient verbalized understanding of these instructions.       FOLLOW-UP Return in 7 weeks  (on 03/12/2015) for Follow up INR at 0845h.  Jorene Guest, III Pharm.D., CACP

## 2015-01-22 NOTE — Patient Instructions (Signed)
Patient instructed to take medications as defined in the Anti-coagulation Track section of this encounter.  Patient instructed to take today's dose.  Patient verbalized understanding of these instructions.    

## 2015-01-25 ENCOUNTER — Encounter: Payer: Self-pay | Admitting: *Deleted

## 2015-01-25 ENCOUNTER — Telehealth: Payer: Self-pay | Admitting: Internal Medicine

## 2015-01-25 NOTE — Telephone Encounter (Signed)
Patient's last Eye Exam from Dr Katy Fitch was 07/12/2014.  Patient has been seen on 04/18/2014,05/01/2014,05/02/2014,05/11/2014,05/29/2014,04/192016,06/07/2014.  All ov notes are being faxed.

## 2015-01-25 NOTE — Progress Notes (Signed)
I have reviewed Dr. Gladstone Pih note.  Patient is on [facility] for Afib, INR at goal.

## 2015-02-06 ENCOUNTER — Other Ambulatory Visit: Payer: Self-pay | Admitting: Internal Medicine

## 2015-02-06 DIAGNOSIS — G8929 Other chronic pain: Secondary | ICD-10-CM

## 2015-02-06 DIAGNOSIS — M545 Low back pain, unspecified: Secondary | ICD-10-CM

## 2015-02-06 DIAGNOSIS — J301 Allergic rhinitis due to pollen: Secondary | ICD-10-CM

## 2015-02-08 NOTE — Telephone Encounter (Signed)
Called Ms. Cabral to confirm she is using the Flonase and the Voltaren gel.  She states that she is.  They were therefore refilled.  She also mentioned that she feel asleep in her chair 2 nights ago and woke up leaning to one side.  This resulted in pain in the rib area.  This was the same scenario that resulted in rib fractures earlier in the year.  The pain is reasonably well controlled with the Voltaren gel, lidocaine jelly, and PRN Vicodin.  She was asked to continue these medications.  She states she just started the alendronate and was reminded this was to help prevent further weakening of her bones, including her ribs.  She is very hesitant to return to the hospital given her poor reaction to therapy last time.  She is scheduled to see me in clinic on 02/23/2015 but was asked to call if the pain worsens for a sooner appointment.

## 2015-02-22 ENCOUNTER — Telehealth: Payer: Self-pay | Admitting: Internal Medicine

## 2015-02-22 NOTE — Telephone Encounter (Signed)
Pt called back, stating she already have a hard copy of the pain medication Rx.

## 2015-02-22 NOTE — Telephone Encounter (Signed)
Patient asking for pain medication refill and to pick it up when she comes to next appt on 03/12/15

## 2015-02-23 ENCOUNTER — Encounter: Payer: PPO | Admitting: Internal Medicine

## 2015-02-27 ENCOUNTER — Other Ambulatory Visit: Payer: Self-pay | Admitting: Internal Medicine

## 2015-02-27 DIAGNOSIS — Z794 Long term (current) use of insulin: Principal | ICD-10-CM

## 2015-02-27 DIAGNOSIS — E1140 Type 2 diabetes mellitus with diabetic neuropathy, unspecified: Secondary | ICD-10-CM

## 2015-03-06 ENCOUNTER — Other Ambulatory Visit: Payer: Self-pay | Admitting: Internal Medicine

## 2015-03-06 DIAGNOSIS — F419 Anxiety disorder, unspecified: Secondary | ICD-10-CM

## 2015-03-06 DIAGNOSIS — M810 Age-related osteoporosis without current pathological fracture: Secondary | ICD-10-CM

## 2015-03-08 ENCOUNTER — Other Ambulatory Visit: Payer: Self-pay | Admitting: Internal Medicine

## 2015-03-08 DIAGNOSIS — E1140 Type 2 diabetes mellitus with diabetic neuropathy, unspecified: Secondary | ICD-10-CM

## 2015-03-08 DIAGNOSIS — Z794 Long term (current) use of insulin: Principal | ICD-10-CM

## 2015-03-09 ENCOUNTER — Telehealth: Payer: Self-pay | Admitting: Internal Medicine

## 2015-03-09 NOTE — Telephone Encounter (Signed)
LMTCB IF SHE NEEDS TO CANCEL

## 2015-03-10 IMAGING — CR DG LUMBAR SPINE COMPLETE 4+V
5 series · 5 of 5 positions shown · non-contrast
Comparison: 11/24/2012 CT abdomen/pelvis

CLINICAL DATA: Low back pain

LUMBAR SPINE - COMPLETE 4+ VIEW

[t l-spine a.p.]
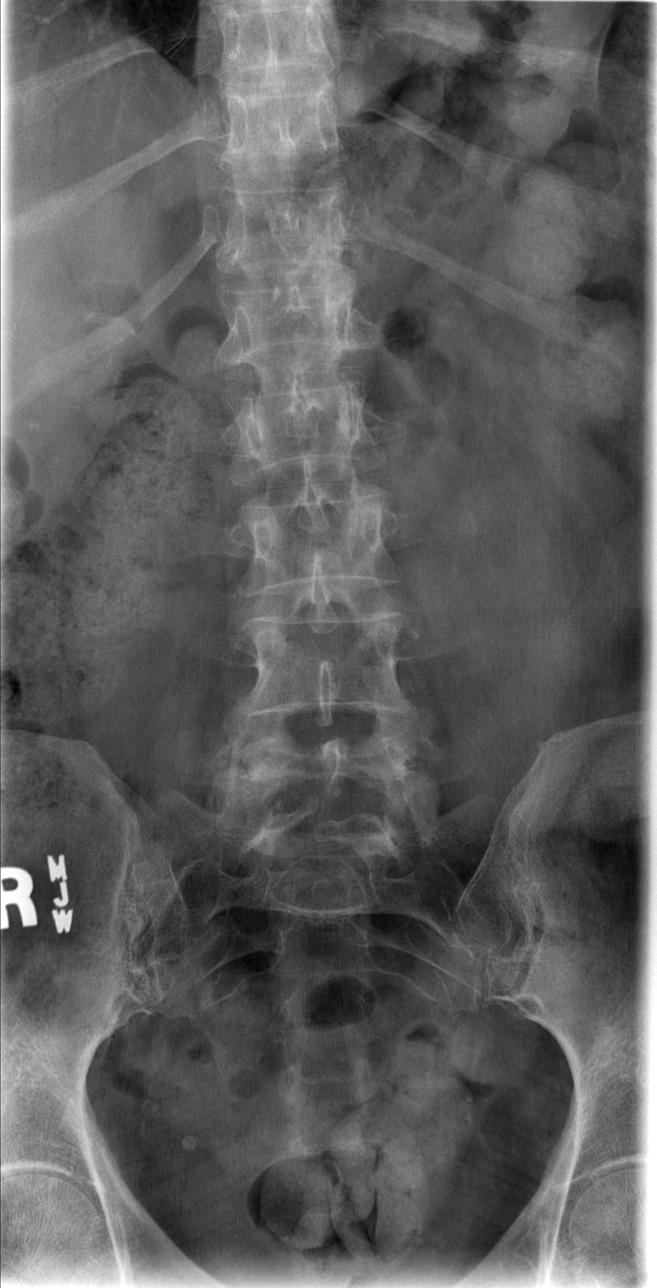

[t l-spine oblique exposure (1 of 2)]
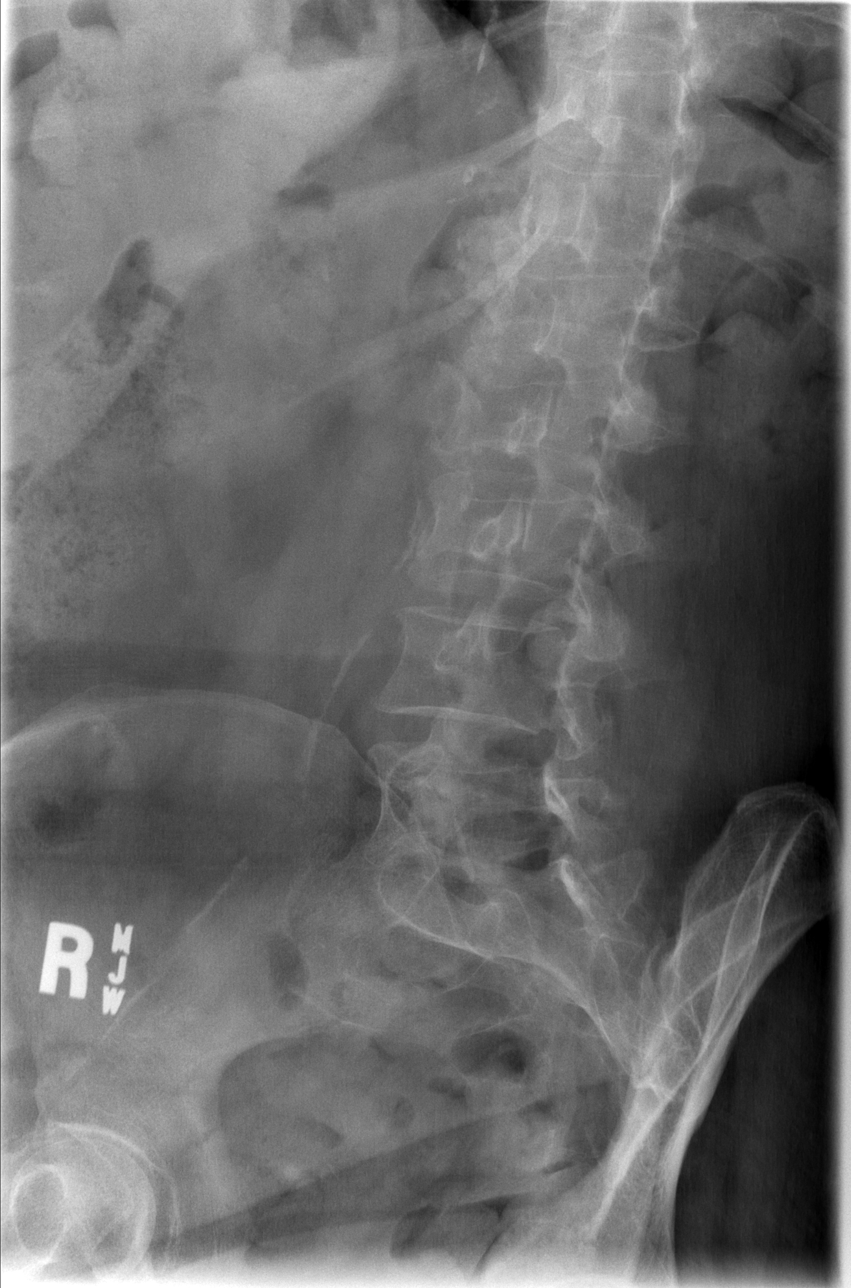

[t l-spine oblique exposure (2 of 2)]
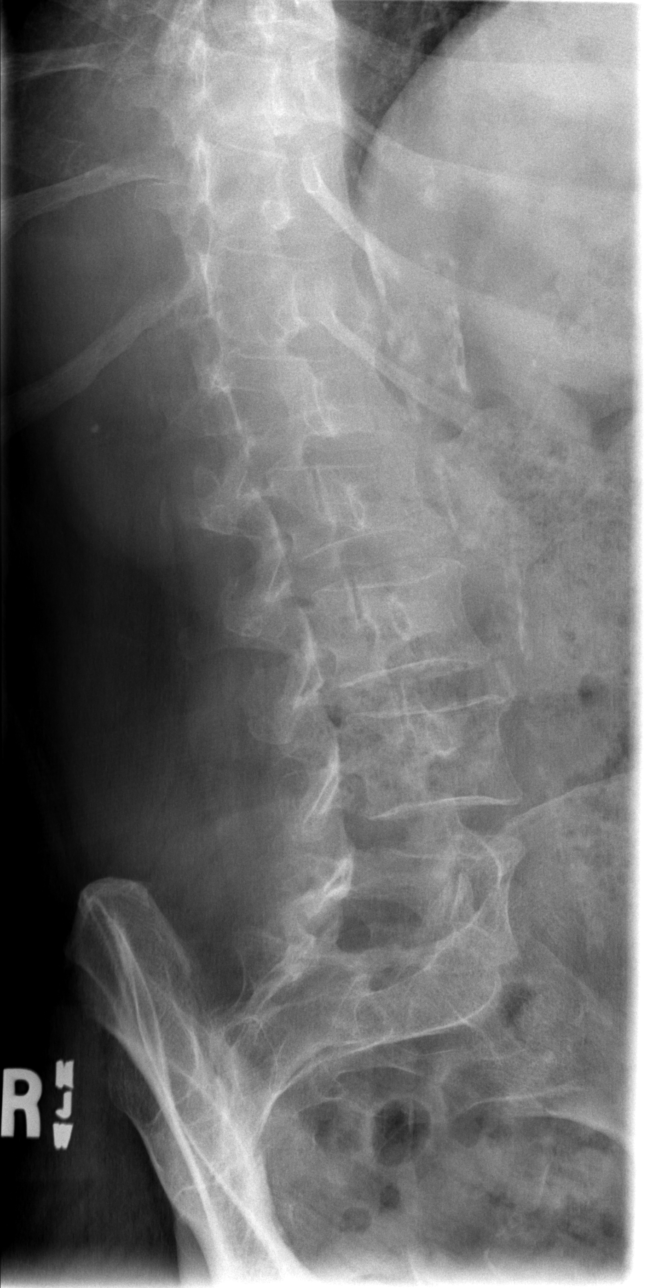

[t l-spine lat]
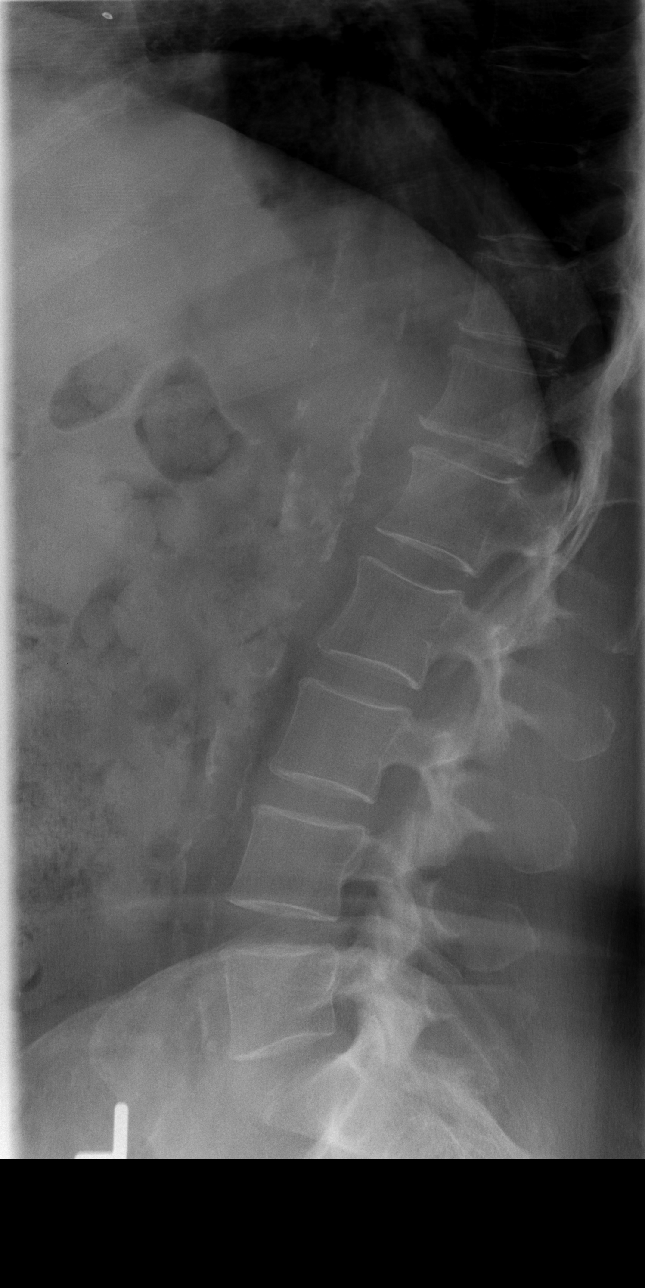

[t l-spine l5-s1 spot]
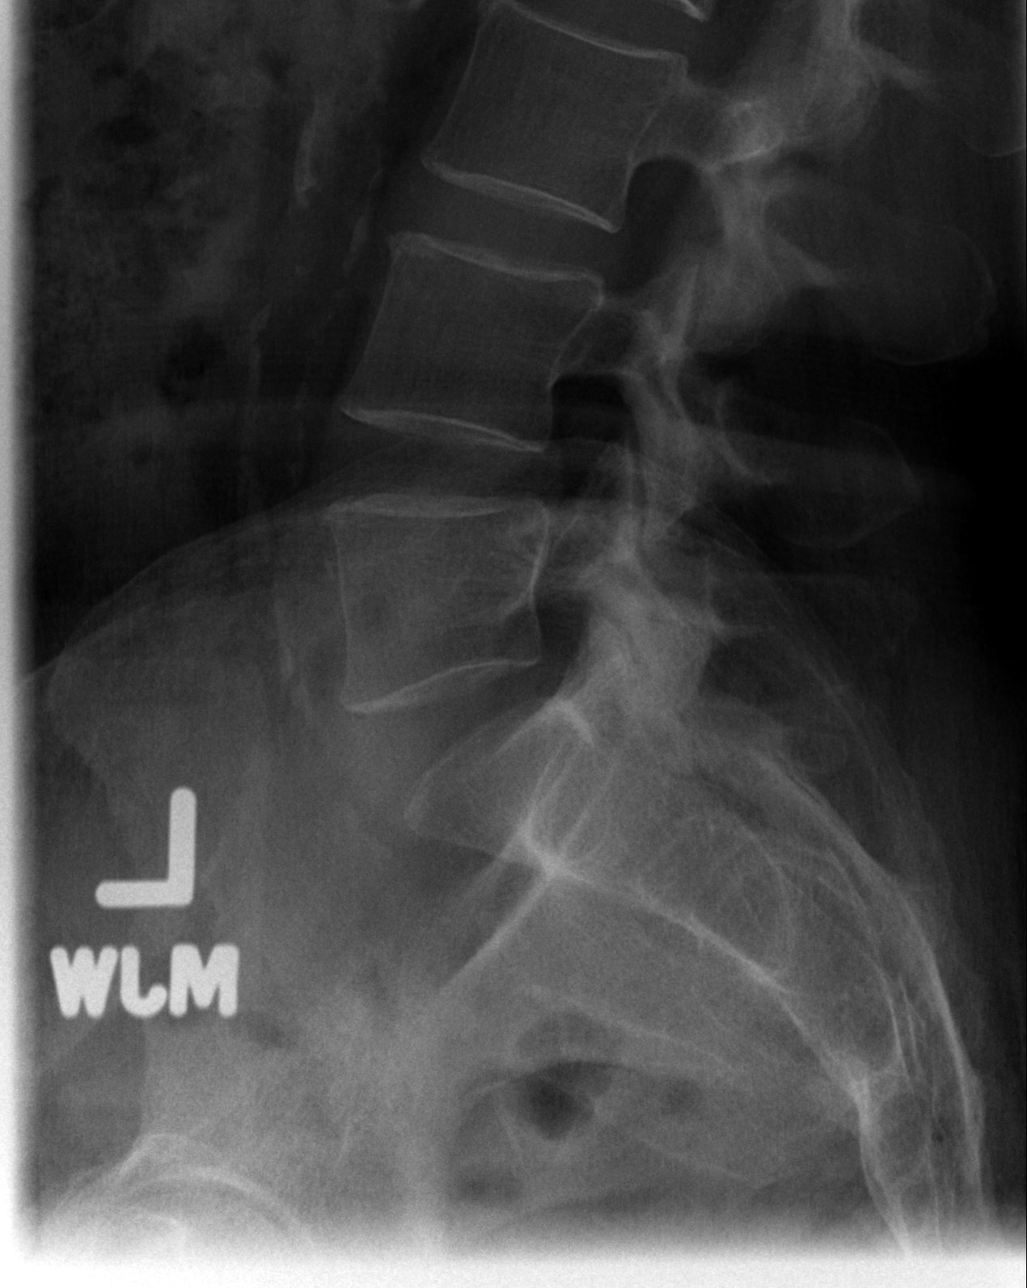

[5 of 5 positions shown; findings below may reference images not displayed]

FINDINGS: Vascular calcification noted without calcified aneurysm.
Normal alignment. Five non-rib bearing lumbar type vertebral bodies
are identified.  Vertebral body heights are preserved.  Mild
subjective osteopenia.  No compression deformity or other acute
abnormality identified.
IMPRESSION: No compression fracture or acute osseous abnormality identified in
the lumbar spine.

## 2015-03-10 IMAGING — US US ABDOMEN LIMITED
1 series · 7 of 7 positions shown · non-contrast
Comparison: CT abdomen 08/25/2012

CLINICAL DATA: Splenic infarct

EXAM:
US ABDOMEN LIMITED - LEFT UPPER QUADRANT

[Series 1: us abdomen limited · 0.25mm/px · 7 of 7 slices shown]
[im 1/7]
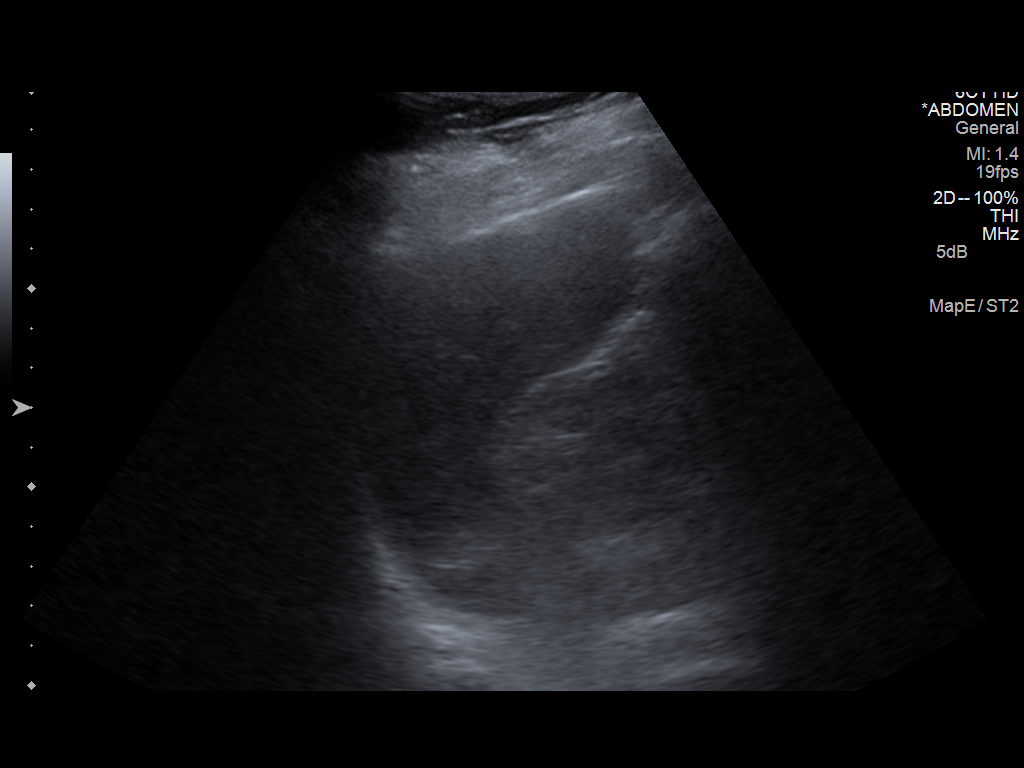
[im 2/7]
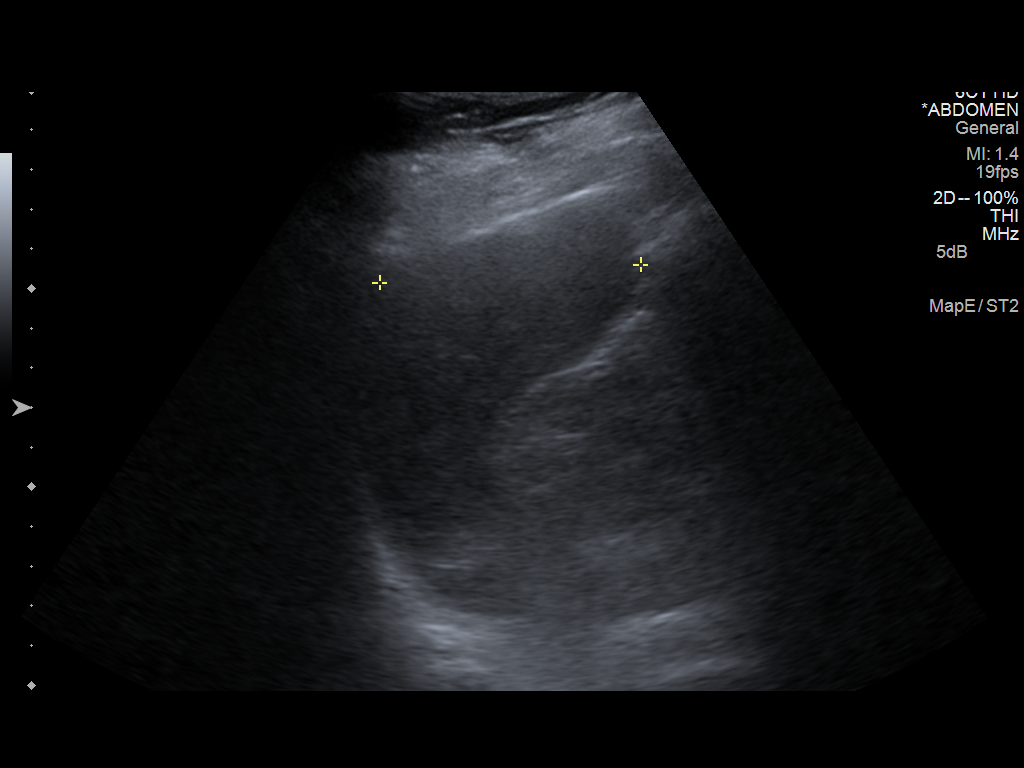
[im 3/7]
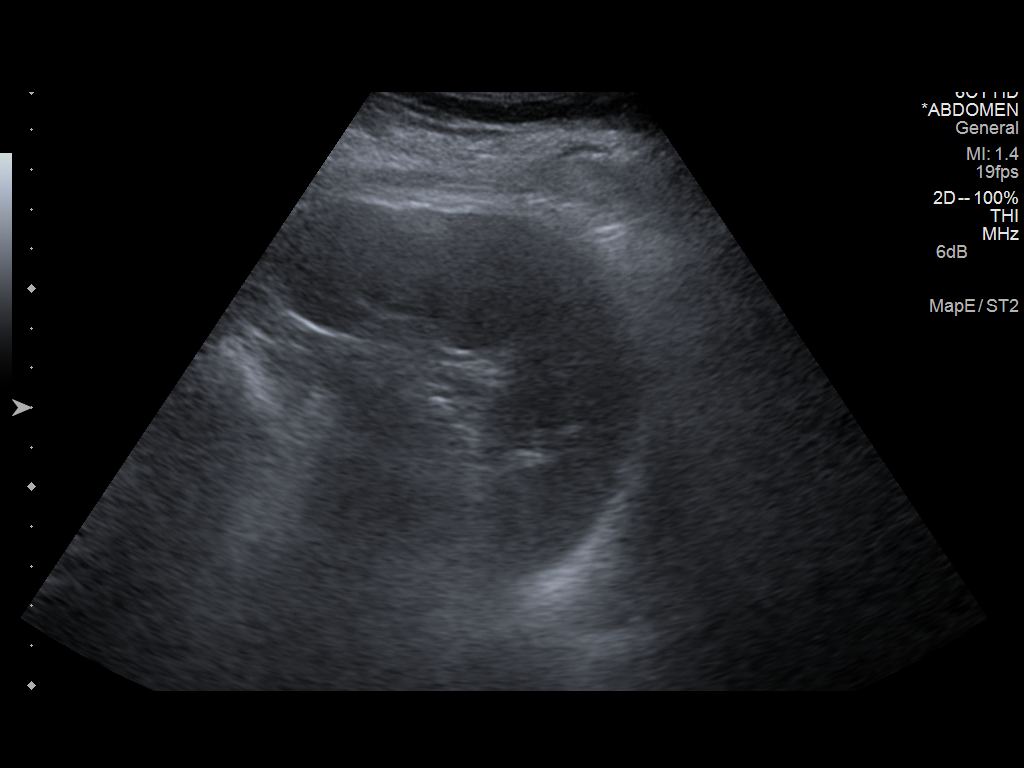
[im 4/7]
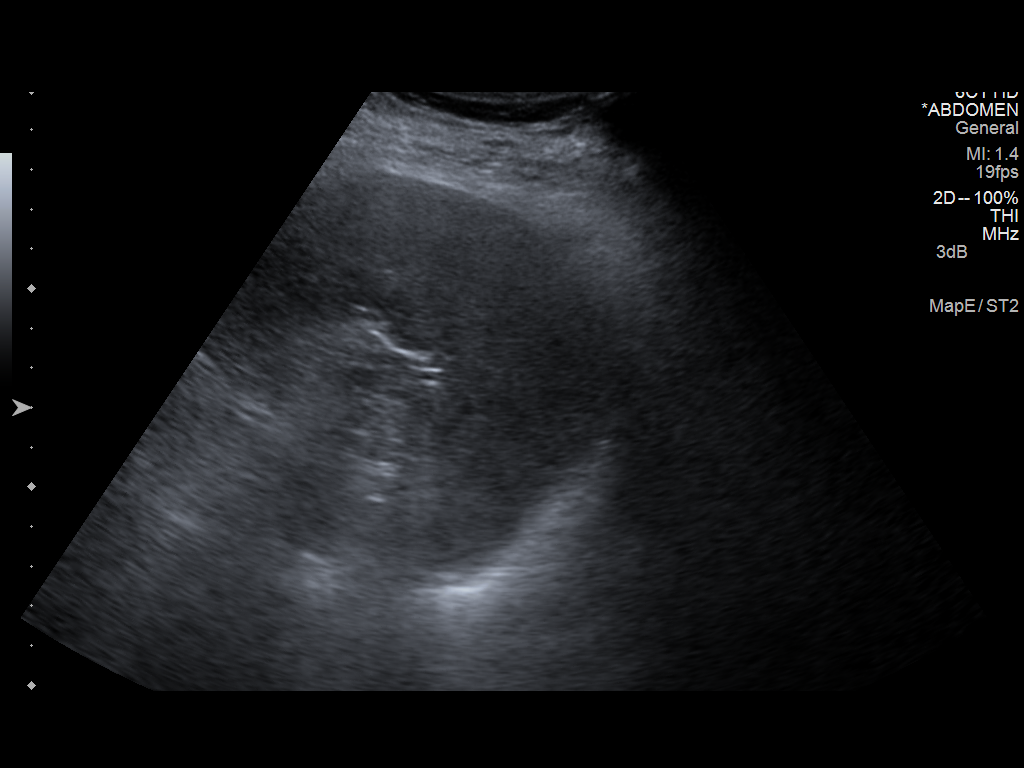
[im 5/7]
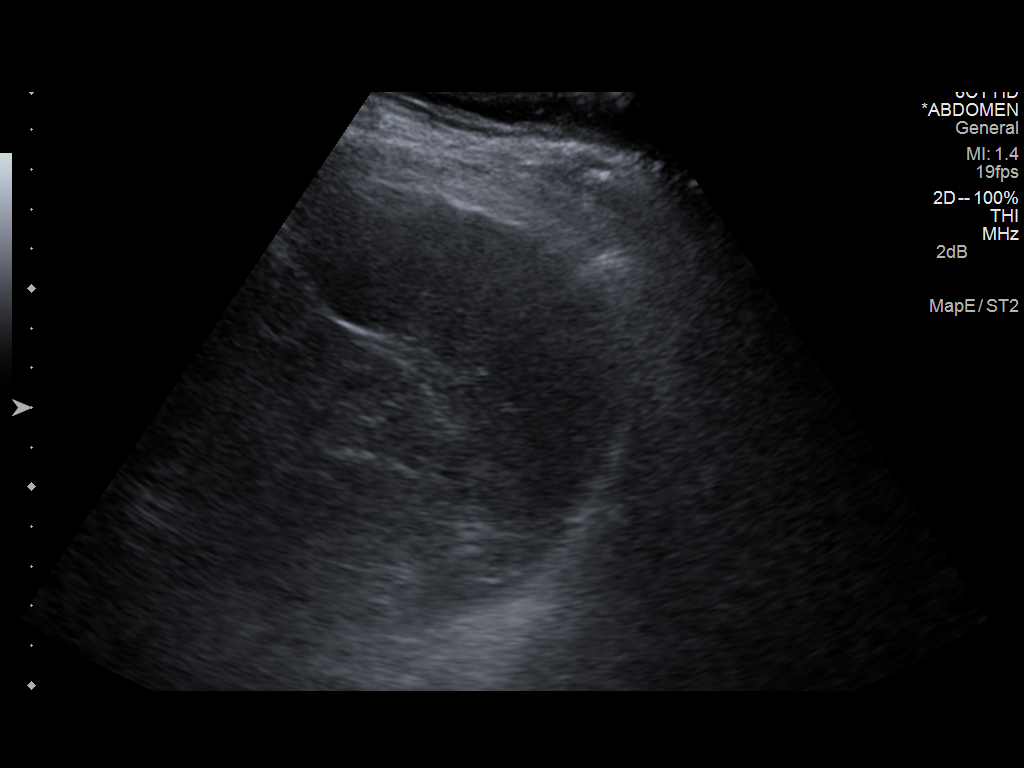
[im 6/7]
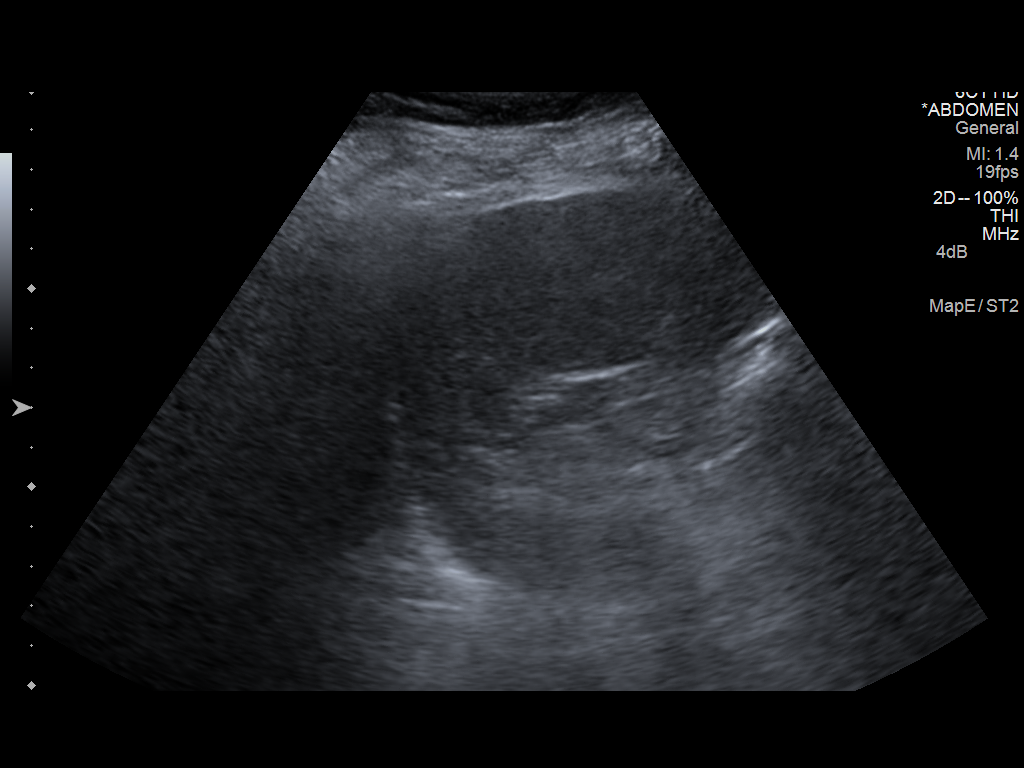
[im 7/7]
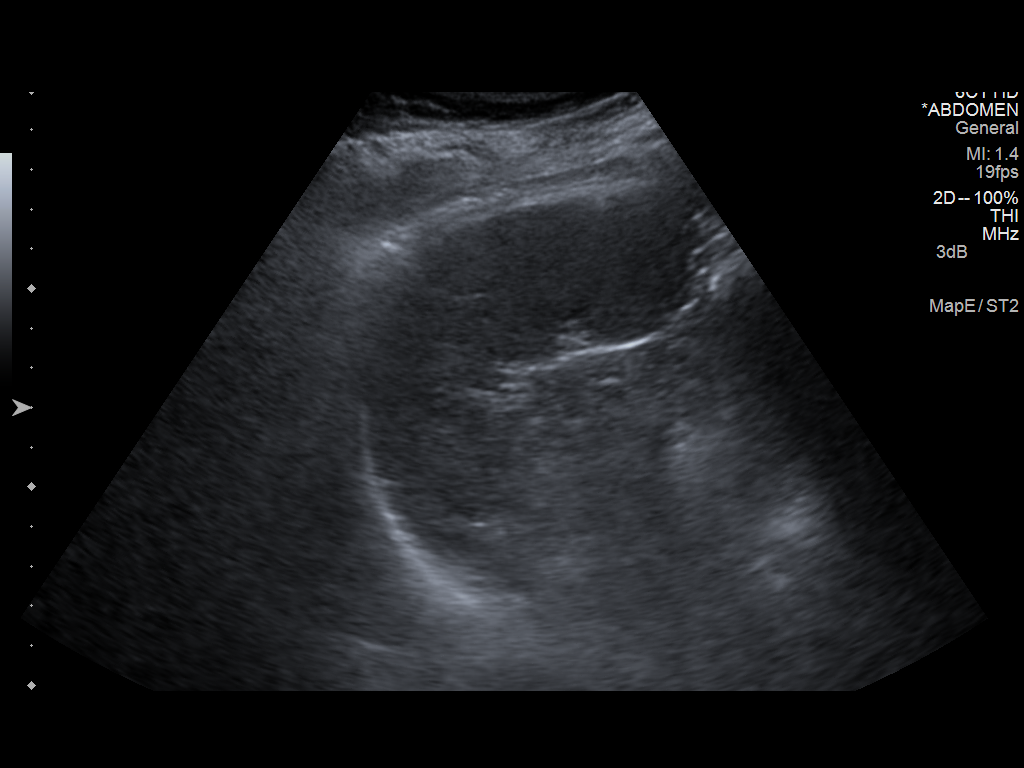

[7 of 7 positions shown; findings below may reference images not displayed]

FINDINGS: Limited sonography of left upper quadrant/spleen was performed.

Spleen is sonographically normal in size and morphology.

No evidence of splenic mass, calcification or abnormal echogenicity.

Specifically no wedge-shaped areas of abnormal parenchymal
echogenicity are identified.

No left upper quadrant or perisplenic free fluid.
IMPRESSION: Normal splenic ultrasound.

## 2015-03-12 ENCOUNTER — Ambulatory Visit (INDEPENDENT_AMBULATORY_CARE_PROVIDER_SITE_OTHER): Payer: PPO | Admitting: Pharmacist

## 2015-03-12 DIAGNOSIS — I480 Paroxysmal atrial fibrillation: Secondary | ICD-10-CM | POA: Diagnosis not present

## 2015-03-12 DIAGNOSIS — Z7901 Long term (current) use of anticoagulants: Secondary | ICD-10-CM

## 2015-03-12 LAB — POCT INR: INR: 2.2

## 2015-03-12 NOTE — Patient Instructions (Signed)
Patient instructed to take medications as defined in the Anti-coagulation Track section of this encounter.  Patient instructed to take today's dose.  Patient verbalized understanding of these instructions.    

## 2015-03-12 NOTE — Progress Notes (Signed)
Anti-Coagulation Progress Note  [name] is a 65 y.o. female who is currently on an anti-coagulation regimen.    RECENT RESULTS: Recent results are below, the most recent result is correlated with a dose of 20 mg. per week: Lab Results  Component Value Date   INR 2.20 03/12/2015   INR 2.40 01/22/2015   INR 2.36* 12/27/2014    ANTI-COAG DOSE: Anticoagulation Dose Instructions as of 03/12/2015      [name] Tue Wed Thu Fri Sat   New Dose 2.5 mg 5 mg 2.5 mg 2.5 mg 2.5 mg 2.5 mg 2.5 mg       ANTICOAG SUMMARY: Anticoagulation Episode Summary    Current INR goal 2.0-3.0  Next INR check 04/09/2015  INR from last check 2.20 (03/12/2015)  Weekly max dose   Target end date   [facility]  Preferred lab   Send INR reminders to [place]   Indications  Paroxysmal atrial fibrillation ([place]) [I48.0] Long term (current) use of anticoagulants [Z79.01]        Comments       Anticoagulation Care Providers    Provider Role Specialty Phone number   Lelon Perla, MD  Cardiology [phone]      ANTICOAG TODAY: Anticoagulation Summary as of 03/12/2015    INR goal 2.0-3.0  Selected INR 2.20 (03/12/2015)  Next INR check 04/09/2015  Target end date    Indications  Paroxysmal atrial fibrillation ([place]) [I48.0] Long term (current) use of anticoagulants [Z79.01]      Anticoagulation Episode Summary    [facility]   Preferred lab    Send INR reminders to [place]   Comments     Anticoagulation Care Providers    Provider Role Specialty Phone number   Lelon Perla, MD  Cardiology [phone]      PATIENT INSTRUCTIONS: Patient Instructions  Patient instructed to take medications as defined in the Anti-coagulation Track section of this encounter.  Patient instructed to take today's dose.  Patient verbalized understanding of these instructions.       FOLLOW-UP Return in 4 weeks (on  04/09/2015) for Follow up INR at 0900h.  Jorene Guest, III Pharm.D., CACP

## 2015-03-12 NOTE — Telephone Encounter (Signed)
Called to pharm 

## 2015-03-12 NOTE — Progress Notes (Signed)
Indication: Paroxysmal atrial fibrillation. Duration: Lifelong. INR: At target. Agree with Dr. Gladstone Pih assessment and plan.

## 2015-04-06 ENCOUNTER — Other Ambulatory Visit: Payer: Self-pay | Admitting: Internal Medicine

## 2015-04-06 DIAGNOSIS — G8929 Other chronic pain: Secondary | ICD-10-CM

## 2015-04-06 DIAGNOSIS — M545 Low back pain, unspecified: Secondary | ICD-10-CM

## 2015-04-06 MED ORDER — HYDROCODONE-ACETAMINOPHEN 5-325 MG PO TABS: 1.0000 | ORAL_TABLET | Freq: Four times a day (QID) | ORAL | Status: DC | PRN

## 2015-04-06 NOTE — Telephone Encounter (Signed)
Pt requesting hydrocodone to be filled.

## 2015-04-06 NOTE — Telephone Encounter (Signed)
Last filled, cannot speak with pharm, phones are not working correctly at [place] visit 12/25/2014, no future appts

## 2015-04-06 NOTE — Telephone Encounter (Signed)
Called to inform rx is ready, LVM

## 2015-04-09 ENCOUNTER — Ambulatory Visit: Payer: PPO

## 2015-04-11 ENCOUNTER — Ambulatory Visit: Payer: PPO

## 2015-05-01 ENCOUNTER — Ambulatory Visit: Payer: PPO | Admitting: Pharmacist

## 2015-05-01 ENCOUNTER — Other Ambulatory Visit: Payer: Self-pay | Admitting: Internal Medicine

## 2015-05-01 DIAGNOSIS — I480 Paroxysmal atrial fibrillation: Secondary | ICD-10-CM

## 2015-05-01 DIAGNOSIS — J439 Emphysema, unspecified: Secondary | ICD-10-CM

## 2015-05-01 DIAGNOSIS — B3731 Acute candidiasis of vulva and vagina: Secondary | ICD-10-CM

## 2015-05-01 DIAGNOSIS — B373 Candidiasis of vulva and vagina: Secondary | ICD-10-CM

## 2015-05-02 NOTE — Telephone Encounter (Signed)
Last appt 12/15/14. No f/u appt has been scheduled.

## 2015-05-07 ENCOUNTER — Telehealth: Payer: Self-pay | Admitting: Internal Medicine

## 2015-05-07 NOTE — Telephone Encounter (Signed)
pt requesting to speak with nurse

## 2015-05-07 NOTE — Telephone Encounter (Signed)
CSW placed called to pt.  CSW left message requesting return call. CSW provided contact hours and phone number.  Message left notifying patient, [facility] drops off a main entrance with valet/volunteers.  CSW requesting return call to discuss additional community resources, such as [facility].

## 2015-05-07 NOTE — Telephone Encounter (Signed)
Spoke with patient who has concerns regarding transportation and other stressors related to her care at home.  Encouraged patient to schedule an acute visit around same time as her coumadin check tomorrow but patient declined, she relies on her sister to bring her to appointments and her sister is currently unable to assist.  Patient states [facility] hasn't worked in the past due to them dropping her off at an old entrance and her not being able to get downstairs on her own.    Spoke with [place] regarding transportation, we have confirmed with [facility] they should only be dropping off at the [employer] and volunteers.  Also, will ask patient if she is open to being connected with [facility] for ongoing case management.

## 2015-05-08 ENCOUNTER — Ambulatory Visit: Payer: PPO | Admitting: Pharmacist

## 2015-05-14 ENCOUNTER — Telehealth: Payer: Self-pay | Admitting: Dietician

## 2015-05-14 ENCOUNTER — Other Ambulatory Visit: Payer: Self-pay

## 2015-05-14 NOTE — Patient Outreach (Signed)
[facility]) Care Management  05/14/2015  [name] 1950-06-04 [id number]   Screening   Referral date:  05/11/2015 Source: HTA Tier3 List  Issues:  0 Admissions, 3 ER visits.  H/o DM2 and CHF. Please engage patient for [facility] Care Management services.  Possible needs for  transportation services.   Outreach call #1 to patient for screening.  Patient not reached.  RN CM left HIPAA compliant voice message with name and number for call back.   Plan:  RN CM scheduled for next contact call within one week.   Mariann Laster, RN, BSN, [facility], CCM  [employer] Management Coordinator [phone] Direct [phone] Cell [phone] Office [phone] Fax

## 2015-05-15 ENCOUNTER — Other Ambulatory Visit: Payer: Self-pay

## 2015-05-15 ENCOUNTER — Encounter: Payer: Self-pay | Admitting: Licensed Clinical Social Worker

## 2015-05-15 ENCOUNTER — Ambulatory Visit (INDEPENDENT_AMBULATORY_CARE_PROVIDER_SITE_OTHER): Payer: PPO | Admitting: Pharmacist

## 2015-05-15 DIAGNOSIS — I480 Paroxysmal atrial fibrillation: Secondary | ICD-10-CM

## 2015-05-15 DIAGNOSIS — Z7901 Long term (current) use of anticoagulants: Secondary | ICD-10-CM | POA: Diagnosis not present

## 2015-05-15 LAB — POCT INR: INR: 2.6

## 2015-05-15 NOTE — Patient Outreach (Signed)
[facility]) Care Management  05/15/2015  [name] [birth year] [id number]  Screening   Referral date: 05/11/2015 Source: HTA Tier3 List  Issues: 0 Admissions, 3 ER visits. H/o DM2 and CHF. Please engage patient for [facility] Care Management services.  Possible needs for transportation services.   Outreach call #2 to patient for screening.  Patient not reached [phone] home/cell RN CM left HIPAA compliant voice message with name and number for call back.   Plan:  RN CM scheduled for next contact call within one week.   Mariann Laster, RN, BSN, [facility], CCM  [employer] Management Coordinator [phone] Direct [phone] Cell [phone] Office [phone] Fax

## 2015-05-15 NOTE — Patient Instructions (Signed)
Patient educated about medication as defined in this encounter and verbalized understanding by repeating back instructions provided.

## 2015-05-15 NOTE — Progress Notes (Signed)
[name] requesting to meet with CSW following her scheduled appointment this morning.  Initially, CSW had made contact with [name] regarding transportation assistance.  Upon meeting with [name] one one one, pt has had multiple changes in her life.  "I just don't feel the same anymore.  Like no one cares about me.".  [name] states she has had an increase in her anxiety and depressive symptoms, increased sleeping, decreased motivation to get dressed or go out of the home and "panic attacks", difficulty breathing during the night hours while walking to/from kitchen.  Pt's sister has been a major support for [name] in the past.  However, sister is now managing her spouses' health issues.  Sister has not been able to transport, attend [name]'s medical appointment with her, assist with grocery shopping and/or meal prep.  In addition to this loss of her sisters' support, [name] has had two teenager ages 40 and 38 move into her home.  One of the teens is [name]'s 30 year old grand-daughter, who was to move in and provide hands-on assistance for the pt and the care of the home.  Pt reports once grand-daughter moved in, the 13 year old friend moved in as well.  Both teens do not work outside the home, assist patient with home maintenance/housekeeping nor provide any financial assistance to the household.  [name] has thought about applying for food stamps but is unsure if she would qualify with having the 2 teens in her home.  Pt has not been eating healthy and states she is ashamed to say what she has been eating.  [name] has utilized [facility] in the past and is familiar but with limited financial resources and decreased independence, pt has not utilized [facility].   [name] has been contact by [facility] several times but has yet to return their call or make contact with them.  CSW encouraged pt to make contact with [facility] as this agency can provided additional resources with RN and CSW.   Pt still has the call back phone number.  CSW discussed transportation, encouraging pt to utilize [facility] if financially able with the assistance of her grand-daughter to provide assistance as a companion. Pt's next [facility] appointment is May 2017 and agreed to discuss her current change in mood/anxiety with her PCP.  [name] states "I hope I can make that appointment."  CSW inquired as to what barriers would prevent her from attending.  Pt was not specific.  CSW reassured [name] if she is able to establish with [facility] they are able to provide support and assistance to assist her with getting to her medical appointments.  Inquired if pt was interested in one on one counseling, pt declined due to lack of financial resources for specialty co-pays. Pt declines home health services stating she has had [place] RN and MSW in the past with little improvement.  This worker expressed hope to [name] that she will become established with [facility] and request [facility] SW to meet with patient and family in the home.  Ms. Khamis denied add'l social work needs at this time.  CSW provided contact information to patient.  Pt aware this worker will remain available to assist as needed.

## 2015-05-15 NOTE — Progress Notes (Signed)
Anticoagulation Management [name] is a 65 y.o. female who reports to the clinic for monitoring of warfarin treatment.    Indication: atrial fibrillation Duration: indefinite  Anticoagulation Clinic Visit History: Patient does not report signs/symptoms of bleeding or thromboembolism  Anticoagulation Episode Summary    Current INR goal 2.0-3.0  Next INR check 06/29/2015  INR from last check 2.6 (05/15/2015)  Weekly max dose   Target end date   INR check [facility]  Preferred lab   Send INR reminders to [place]   Indications  Paroxysmal atrial fibrillation ([place]) [I48.0] Long term (current) use of anticoagulants [Z79.01]        Comments       Anticoagulation Care Providers    Provider Role Specialty Phone number   Lelon Perla, MD  Cardiology [phone]     ASSESSMENT Recent Results: The most recent result is correlated with 20 mg per week: Lab Results  Component Value Date   INR 2.6 05/15/2015   INR 2.20 03/12/2015   INR 2.40 01/22/2015   Anticoagulation Dosing: INR as of 05/15/2015 and Previous Dosing Information    INR Dt INR Goal [employer] Sun Mon Tue Wed Thu Fri Sat   05/15/2015 2.6 2.0-3.0 20 mg 2.5 mg 5 mg 2.5 mg 2.5 mg 2.5 mg 2.5 mg 2.5 mg    Anticoagulation Dose Instructions as of 05/15/2015      Total Sun Mon Tue Wed Thu Fri Sat   New Dose 20 mg 2.5 mg 5 mg 2.5 mg 2.5 mg 2.5 mg 2.5 mg 2.5 mg     (5 mg x 0.5)  (5 mg x 1)  (5 mg x 0.5)  (5 mg x 0.5)  (5 mg x 0.5)  (5 mg x 0.5)  (5 mg x 0.5)                           INR today: Therapeutic  PLAN Weekly dose was unchanged   Patient Instructions  Patient educated about medication as defined in this encounter and verbalized understanding by repeating back instructions provided.    Patient advised to contact clinic or seek medical attention if signs/symptoms of bleeding or thromboembolism occur.  Patient verbalized understanding by repeating back  information and was advised to contact me if further medication-related questions arise. Patient was also provided an information handout.  Follow-up Return in about 1 month (around 06/14/2015).  Kim,Jennifer J  15 minutes spent face-to-face with the patient during the encounter. 50% of time spent on education. 50% of time was spent on assessment and plan.  Patient was seen in clinic with Felicity Pellegrini, PharmD candidate.

## 2015-05-16 ENCOUNTER — Other Ambulatory Visit: Payer: Self-pay

## 2015-05-16 IMAGING — CR DG CHEST 1V PORT
1 series · 1 of 1 positions shown · non-contrast
Comparison: Multiple priors

CLINICAL DATA: Fall this morning.  Weakness and history of COPD.

EXAM:
PORTABLE CHEST - 1 VIEW

[AP]
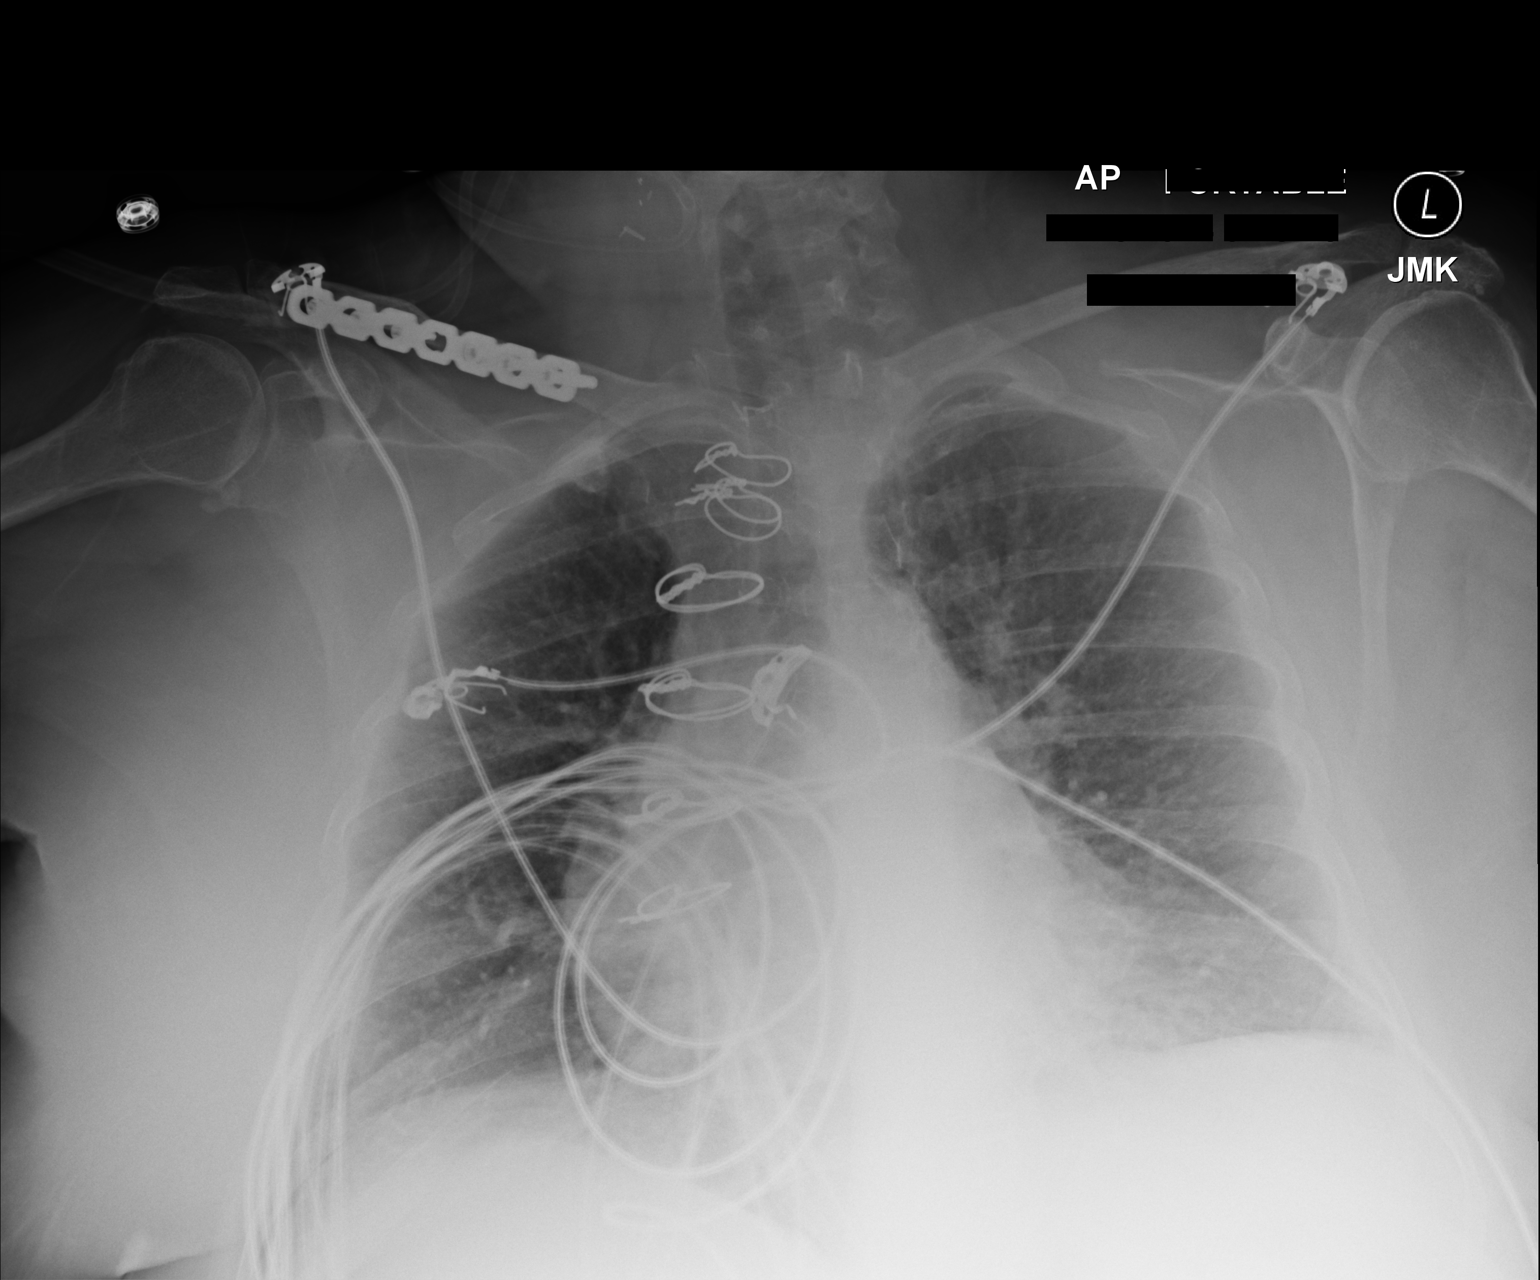

[1 of 1 positions shown; findings below may reference images not displayed]

FINDINGS: The cardiomediastinal silhouette is likely unchanged given patient
rotation. Prominence of the interstitial markings, with a chronic
component. Superimposed pulmonary vascular congestion is not
excluded. Mild bibasilar opacities, left greater than right, likely
atelectasis. No pleural effusion or pneumothorax. No acute osseous
abnormality.
IMPRESSION: 1. Prominence of the interstitial markings. A component of this is
chronic. Superimposed pulmonary vascular congestion is not excluded.

2. Bibasilar opacities, left greater than right, likely atelectasis.

## 2015-05-16 IMAGING — CT CT HEAD W/O CM
1 series · 16 of 30 positions shown, 20 images · non-contrast
Comparison: 12/12/2011.

CLINICAL DATA: Dizziness.  Coumadin therapy.

EXAM:
CT HEAD WITHOUT CONTRAST
TECHNIQUE: Contiguous axial images were obtained from the base of the skull
through the vertex without intravenous contrast.

[Series 2: head 5.0 h30s · axial · 0.40mm/px · z∈[-58,+87]mm · 16 of 33 slices shown, 20 images]
[im 2/33  brain]
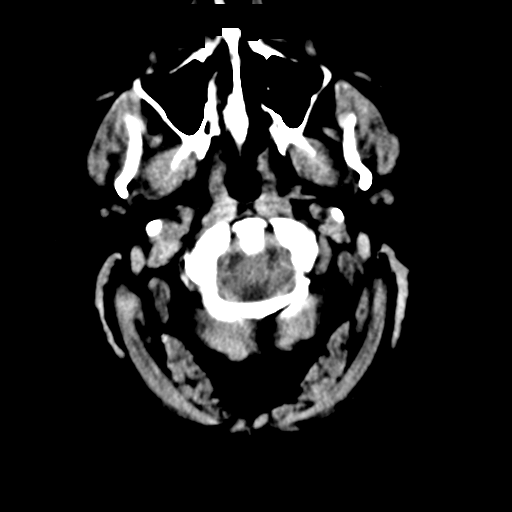
[im 2/33  bone]
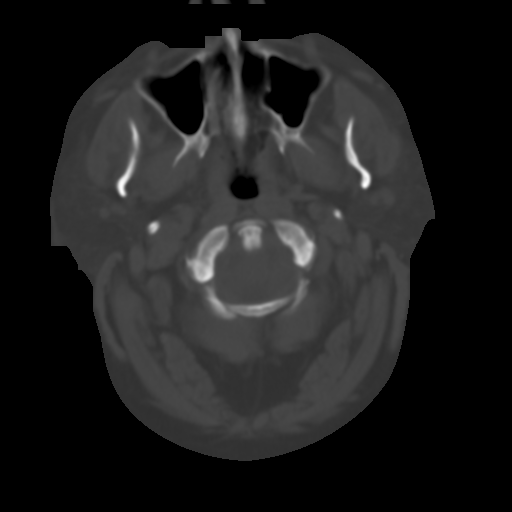
[im 4/33  brain]
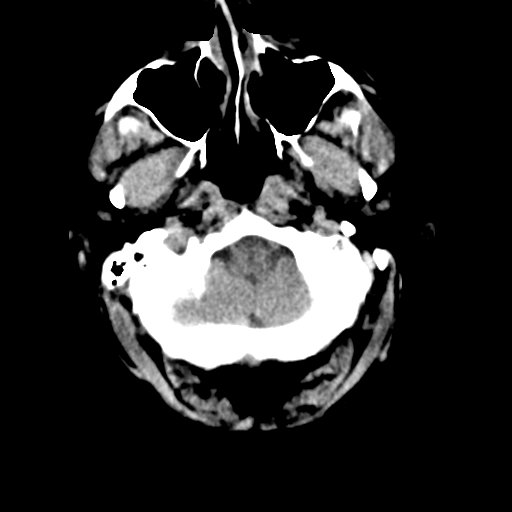
[im 6/33  brain]
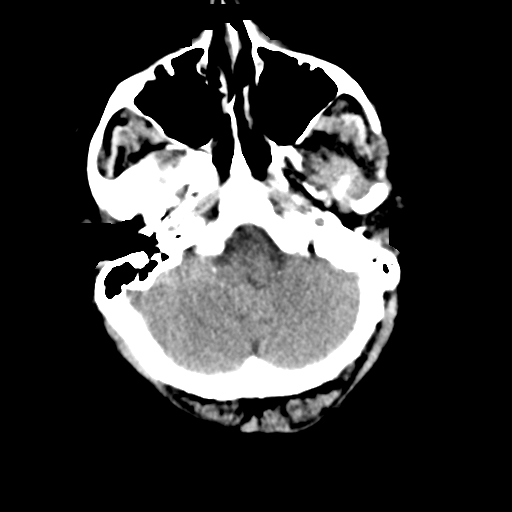
[im 8/33  brain]
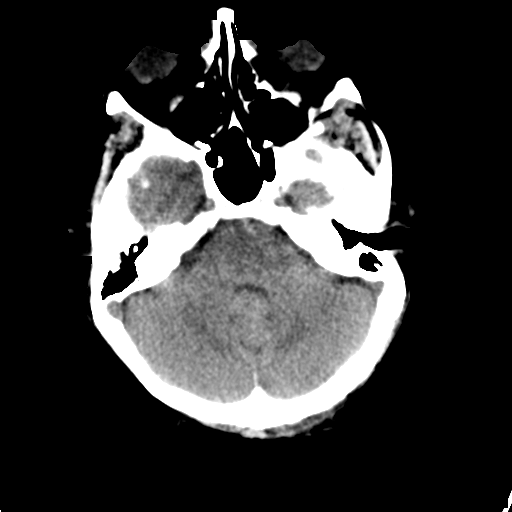
[im 9/33  brain]
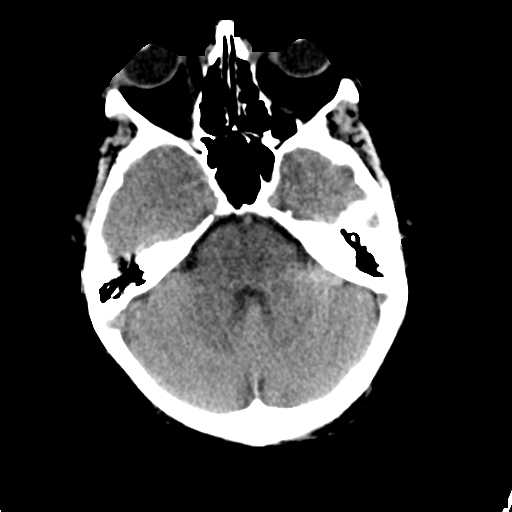
[im 9/33  bone]
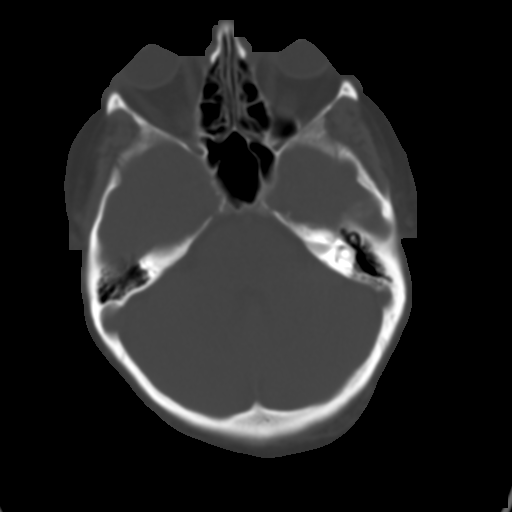
[im 12/33  brain]
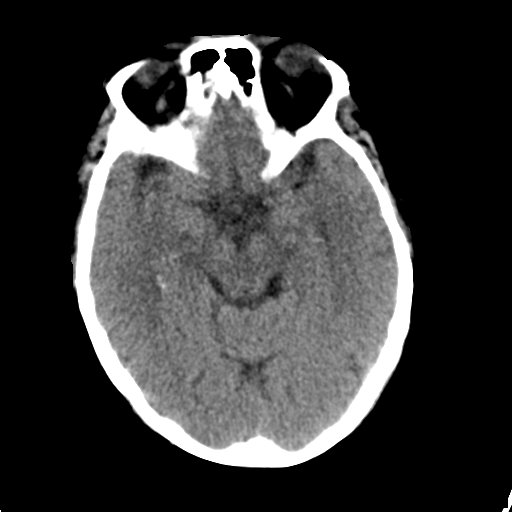
[im 14/33  brain]
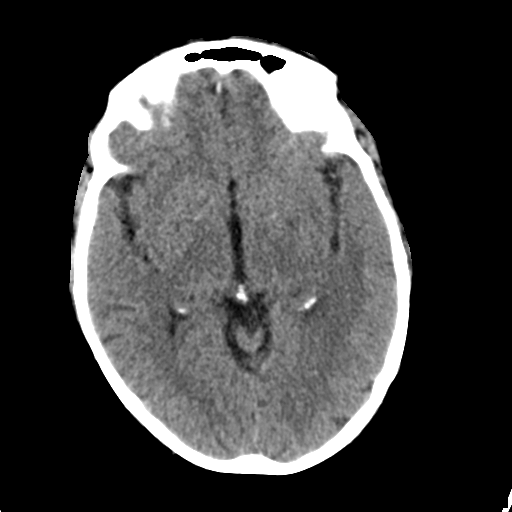
[im 16/33  brain]
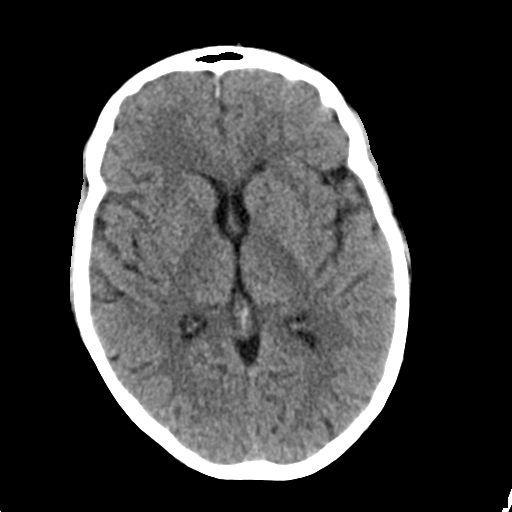
[im 17/33  brain]
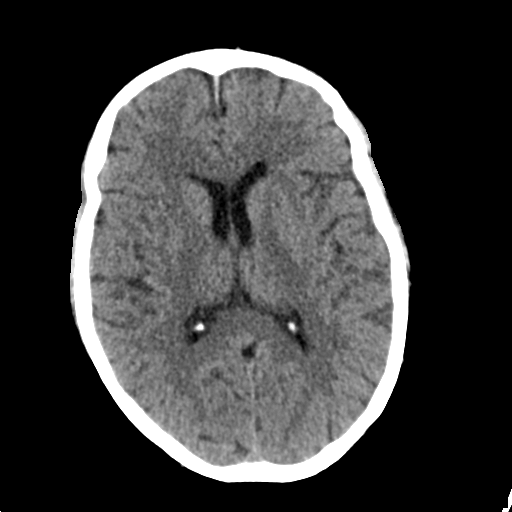
[im 17/33  bone]
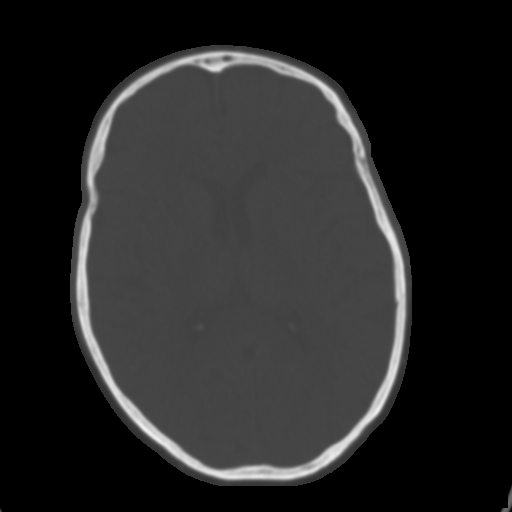
[im 19/33  brain]
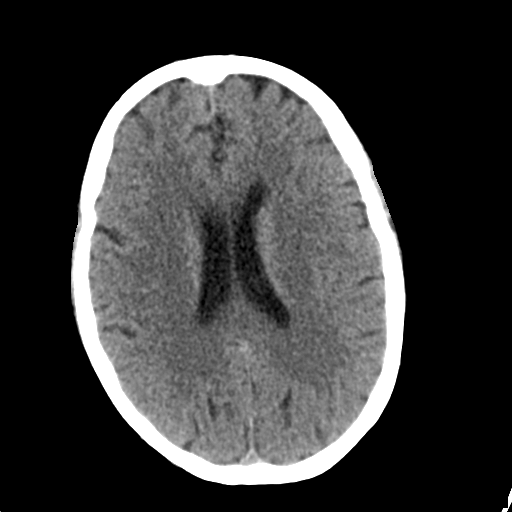
[im 21/33  brain]
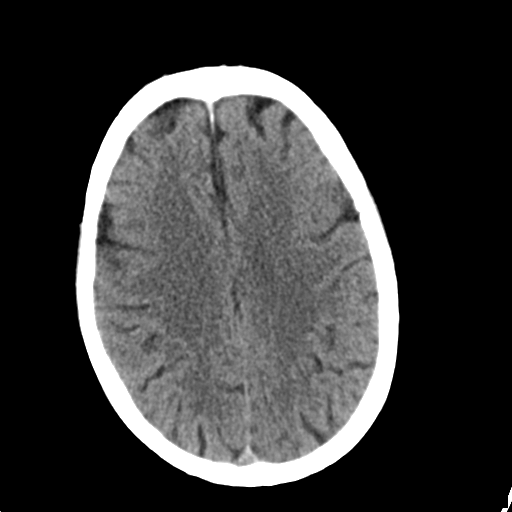
[im 24/33  brain]
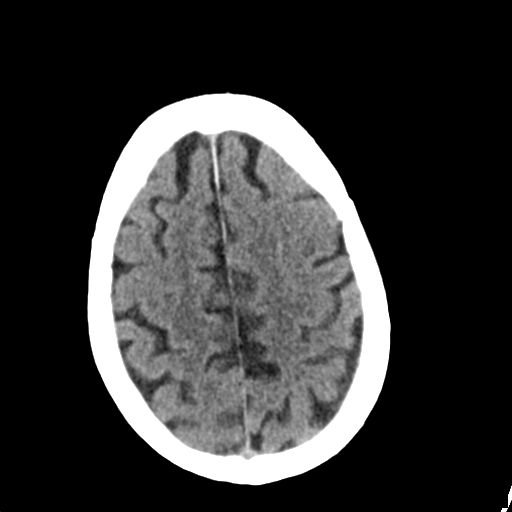
[im 25/33  brain]
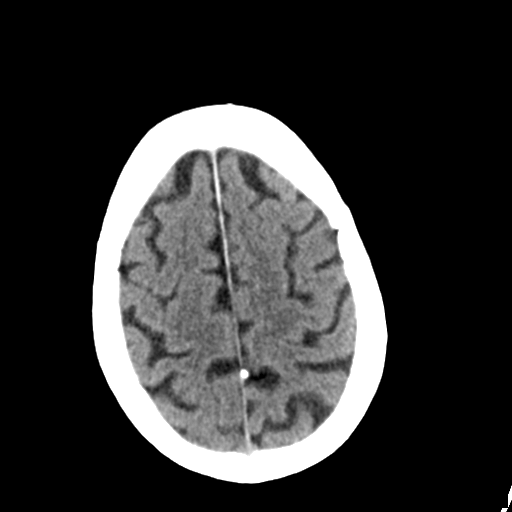
[im 25/33  bone]
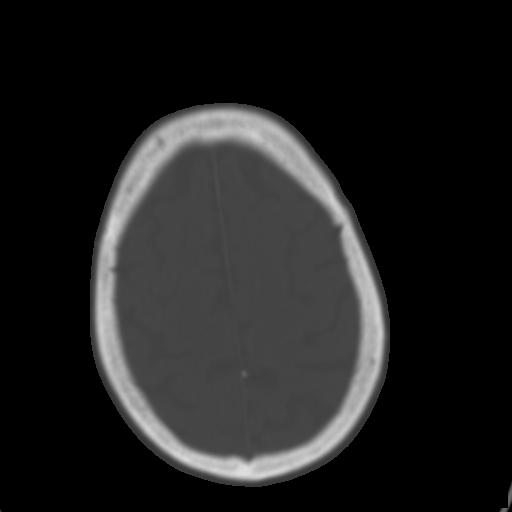
[im 27/33  brain]
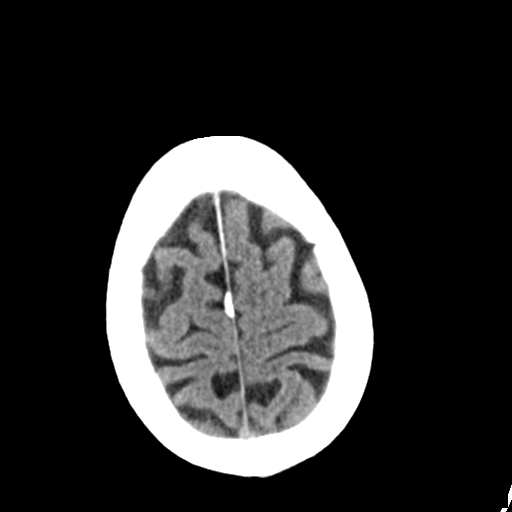
[im 29/33  brain]
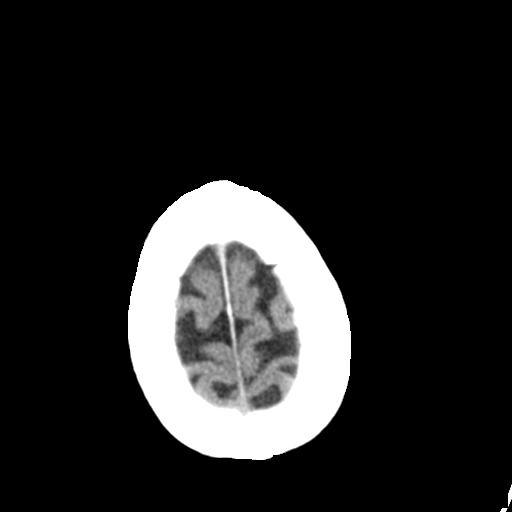
[im 31/33  brain]
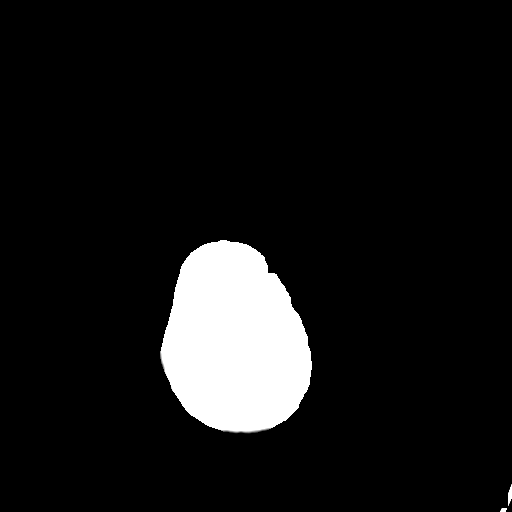

[16 of 30 positions shown; findings below may reference images not displayed]

FINDINGS: No mass. No hydrocephalus. No hemorrhage. No acute bony abnormality.
Paranasal sinuses mastoids are clear.
IMPRESSION: No acute abnormality.

## 2015-05-16 NOTE — Patient Outreach (Signed)
[facility]) Care Management  05/16/2015  KEMYAH BUSER 1950/11/01 [id number]   Screening   Referral date: 05/11/2015 Source: HTA Tier3 List  Issues: 0 Admissions, 3 ER visits. H/o DM2 and CHF. Please engage patient for [facility] Care Management services.  Possible needs for transportation services.   Outreach call #3 to patient for screening.  Patient not reached [phone] home/cell RN CM left HIPAA compliant voice message with name and number for call back.   Plan:  RN CM mailed unsuccessful outreach letter. RN CM will review case for any response over the next 2 weeks.   Mariann Laster, RN, BSN, [facility], CCM  [employer] Management Coordinator [phone] Direct [phone] Cell [phone] Office [phone] Fax

## 2015-05-16 NOTE — Telephone Encounter (Signed)
Called patient as part of project trying to help patient's with a1C between 8-9% lower their blood sugars to target.  Unable to reach patient by phone.

## 2015-05-16 NOTE — Progress Notes (Signed)
INTERNAL MEDICINE TEACHING ATTENDING ADDENDUM - Lalla Brothers M.D  Duration- indefinite, Indication- atrial fibrillation, INR- therapeutic. Agree with pharmacy recommendations as outlined in their note.

## 2015-05-16 NOTE — Telephone Encounter (Signed)
Patient called back, no needs identified

## 2015-05-17 ENCOUNTER — Other Ambulatory Visit: Payer: Self-pay

## 2015-05-17 VITALS — Ht 62.0 in

## 2015-05-17 DIAGNOSIS — E1310 Other specified diabetes mellitus with ketoacidosis without coma: Secondary | ICD-10-CM

## 2015-05-17 DIAGNOSIS — Z9181 History of falling: Secondary | ICD-10-CM

## 2015-05-17 NOTE — Patient Outreach (Signed)
[facility]) Care Management  05/17/2015  [name] Oct 27, 1950 [id number]   Screening   Referral date: 05/11/2015 Source: HTA Tier3 List  Issues: 0 Admissions, 3 ER visits. H/o DM2 and CHF. Please engage patient for [facility] Care Management services.  Possible needs for transportation services.   Subjective:   Inbound call received from patient returning call back to RN CM.  Patient states she does not always take her medications or keep MD appointments even though she knows she should.  Gives reasons as follows:  Sleeping too much, depression, and lack of motivation to even ambulate to the next room to get her medications when they are due.  States she has shortness of breath with ambulation.  States she had anemia requiring blood transfusions on last admission and has not followed up with her doctor since.  Patient states she does not know what her blood count is now.  States she also has issues with panic attacks   Providers: Primary MD: Dr. [name]  -  last appt: 01/02/16    next appt:  06/29/15 Cardiologist:  Patient stopped seeing Specialty MDs due to cost of co-pay was $50.00.  States now co-pay is $25 and $10 and more affordable with HTA insurance plan.  Pulmonologist:  States she had 1 visit only Hematologist:  yearly  HH: none  Insurance: HTA  Social: Patient is single lives in her home with two teenagers. [facility] daughter and her boyfriend). Disabled for 12-13 years.  Mobility: Ambulating with no assistive devices.   Falls: estimates last fall at 4-6 months ago.  [place] out of a chair.  Pain: yes  Fibromyalgia, osteoporosis, carpal tunnel, stress headaches.  Vicodin twice a day or every 6 hours as needed.   MD has discussed a referral to a Pain Management Clinic but patient is not sure she wants to go and states "they only want to put you on more medication".   Transportation:  Sister, [name],  but only when able.  SCAT Services active.   Patient is now in need of transportation resource options.   Patient is interested in assistance for [employer] for SCAT.  Caregiver:  Daughter, [name] but limited time due to her own family needs/husband with colon cancer.  Advance Directive:  none. Not interested at this time.  However, states she has discussed with her sister and daughter and they know what to do.  Resources:   -Partial Medicaid:  Covers deductibles, monthly Medicare premium.  Consent:  Agreed to services.  DME: cane, walker, Diabetic testing supplies, ([facility]); CBG one touch verio (02/2014); had to change the brand this past year due to insurance. CPap ([facility]), Home oxygen ([facility]) - states issues getting CPap supplies due to balance owed to [facility].)   [facility] conditions:  COPD, DM2 Heart surgery 2012  ER visits: (0) Admissions: (1) over last 6 months Last admission: 12/26/2014 - 12/29/2014  Symptomatic anemia A1C 8.2 BS 282 this morning and normally runs around 157.  Self checks blood sugar three times a day.  States MDs BS target range for patient is 100-200 Diabetic Eye exam due now and needs to reschedule a recently misses appt.  Cataracts 2016   Feet  None owed money.   Self nail trims.  Med alert no  Medications:  Patient taking 10-15 medications  Co-pay cost issues:  None  Flu Vaccine: 10/06/14 Pneumonia Vaccine: Prevnar 02/17/2014 and Pneumovax 03/26/2009 Medication non-adherence reported:  Not taking meds cause  they are in the kitchen and "I just don't get up to go into the other room to get them." Pharmacy:  [facility]:  home delivery  Medications Reviewed Today    Reviewed by Standley Brooking, RN (Registered Nurse) on 05/18/15 at 1200  Med List Status: <None>   Medication Order Taking? Sig Documenting Provider Last Dose Status Informant   albuterol (PROAIR HFA) 108 (90 BASE) MCG/ACT inhaler [id number] Yes Inhale 1-2 puffs into the lungs every 6 (six)  hours as needed for wheezing or shortness of breath. [name], MD Taking Active Self   alendronate (FOSAMAX) 70 MG tablet [id number] Yes Take 1 tablet (70 mg total) by mouth once a week. [name], MD Taking Active    ALPRAZolam [name]) 1 MG tablet [id number] Yes Take 0.5-1 tablets (0.5-1 mg total) by mouth 2 (two) times daily as needed for anxiety. [name], MD Taking Active    Blood Glucose Monitoring Suppl [facility] VERIO) W/DEVICE KIT [id number] Yes 1 each by Does not apply route 4 (four) times daily. [name], MD Taking Active Self   buPROPion [facility] Pender) 100 MG tablet [id number] Yes Take 1 tablet (100 mg total) by mouth 2 (two) times daily. [name], MD Taking Active Self   Calcium Carb-Cholecalciferol 600-800 MG-UNIT TABS [id number] Yes Take 1 tablet by mouth 2 (two) times daily.  [name], MD Taking Active Self   chlorpheniramine (CHLOR-TRIMETON) 4 MG tablet [id number] Yes Take 1 tablet (4 mg total) by mouth 3 (three) times daily as needed for allergies. [name], MD Taking Active Self             Med Note Tamala Julian, JEFFREY W   Thu Oct 19, 2014  9:34 PM): ....    diclofenac sodium (VOLTAREN) 1 % GEL [id number] Yes Apply 2 g topically 4 (four) times daily as needed. [name], MD Taking Active    EASY TOUCH PEN NEEDLES 31G X 5 MM [place] [id number] Yes USE TO INJECT INSULIN 5 TIMES DAILY [name], MD Taking Active Self   fluticasone (FLONASE) 50 MCG/ACT nasal spray [id number] Yes Place 2 sprays into both nostrils daily. [name], MD Taking Active    Fluticasone-Salmeterol (ADVAIR DISKUS) 250-50 MCG/DOSE AEPB [id number] Yes Inhale 1 puff into the lungs 2 (two) times daily. [name], MD Taking Active    furosemide (LASIX) 80 MG tablet [id number] Yes Take 1 tablet (80 mg total) by mouth daily. Shela Leff, MD Taking Active    gabapentin (NEURONTIN) 300 MG capsule [id number] Yes Take 1 capsule (300 mg total) by mouth 3 (three) times daily.  [name], MD Taking Active Self   HYDROcodone-acetaminophen (NORCO/VICODIN) 5-325 MG tablet [id number] Yes Take 1-2 tablets by mouth every 6 (six) hours as needed for severe pain. [name], MD Taking Active    insulin aspart (NOVOLOG FLEXPEN) 100 UNIT/ML FlexPen [id number] Yes Inject 18 Units into the skin 3 (three) times daily with meals. [name], MD Taking Active    Insulin Detemir (LEVEMIR FLEXTOUCH) 100 UNIT/ML Pen [id number] Yes Inject 45 Units into the skin 2 (two) times daily. [name], MD Taking Active    Lancets Misc. (ACCU-CHEK FASTCLIX LANCET) KIT [id number] Yes Check your blood 4 times a day dx code 250.00 insulin requiring [name], MD Taking Active Self   lidocaine (XYLOCAINE) 2 % jelly [id number] Yes APPLY TO [place] A DAY [name], MD Taking Active Self   nystatin cream (MYCOSTATIN) [id number] Yes Apply topically 2 (two) times daily.  [name], MD Taking Active    omeprazole (PRILOSEC) 40 MG capsule [id number] Yes Take 1 capsule (40 mg total) by mouth 2 (two) times daily. [name], MD Taking Active Self   [facility] VERIO test strip [id number] Yes Check blood sugar up to 4 times a day [name], MD Taking Active    potassium chloride SA (K-DUR,KLOR-CON) 20 MEQ tablet [id number] Yes Take 1 tablet (20 mEq total) by mouth 2 (two) times daily. [name], MD Taking Active Self   rosuvastatin (CRESTOR) 20 MG tablet [id number] Yes Take 1 tablet (20 mg total) by mouth at bedtime. [name], MD Taking Active Self   sertraline (ZOLOFT) 100 MG tablet [id number] Yes Take 2 tablets (200 mg total) by mouth daily. [name], MD Taking Active Self   tiotropium [facility] HANDIHALER) 18 MCG inhalation capsule [id number] Yes Place 1 capsule (18 mcg total) into inhaler and inhale daily. [name], MD Taking Active Self   warfarin (COUMADIN) 5 MG tablet [id number] Yes TAKE 1/2 TABLET BY MOUTH EVERY DAY AT 6PM ** EXCEPT ON MONDAYS TAKE  1 TABLET (5 MG) [name], MD Taking Active             Assessment and Plan:   [place] RN CM Referral  -DM with elevated BS and A1C -Medication non-adherence -Depression  -needs encouragement and coaching on importance of scheduling Primary MD appt to follow-up on anemia.   [facility] Social Work Referral -Psycho/social assessment: screening positive for depression, anxiety, stress -Financial assessment:  Issues affording transportation -Transportation assessment:  Patient requesting tickets for [place] Referral -Medication non-adherence (lack of motivation  / unmanaged depression).   RN CM advised in next [facility] scheduled contact call within next 10 business days for [place] contact call.   RN CM advised to please notify MD of any changes in condition prior to scheduled appt's.   RN CM provided contact name and # [phone] or main office # [phone] and 24-hour nurse line # [phone].  RN CM confirmed patient is aware of 911 services for urgent emergency needs.  Mariann Laster, RN, BSN, [facility], CCM  [employer] [employer] Management Coordinator [phone] Direct [phone] Cell [phone] Office [phone] Fax

## 2015-05-18 DIAGNOSIS — Z9181 History of falling: Secondary | ICD-10-CM | POA: Insufficient documentation

## 2015-05-18 IMAGING — MR MR LUMBAR SPINE WO/W CM
4 of 7 series · 19 of 48 positions shown · IV contrast (multihance)
Comparison: Lumbar radiographs 11/01/2012. CT Abdomen and Pelvis
08/25/2012. Lumbar MRI 10/23/2004.

CLINICAL DATA: 62-year-old female status post fall with severe back
pain. Initial encounter. History of left renal carcinoma
cryoablation and 6553.

EXAM:
MRI LUMBAR SPINE WITHOUT AND WITH CONTRAST
TECHNIQUE: Multiplanar and multiecho pulse sequences of the lumbar spine were
obtained without and with intravenous contrast.
CONTRAST:  19mL MULTIHANCE GADOBENATE DIMEGLUMINE 529 MG/ML IV SOLN

[Series 3: T2 · sagittal · 4.0mm · 0.55mm/px · 3 of 14 slices shown (1 of 2)]
[im 1/14]
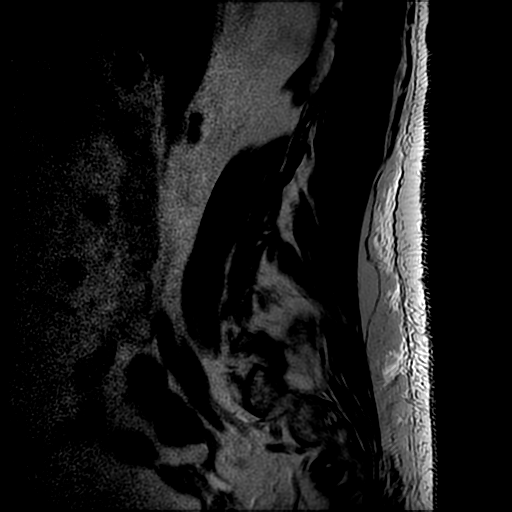
[im 7/14]
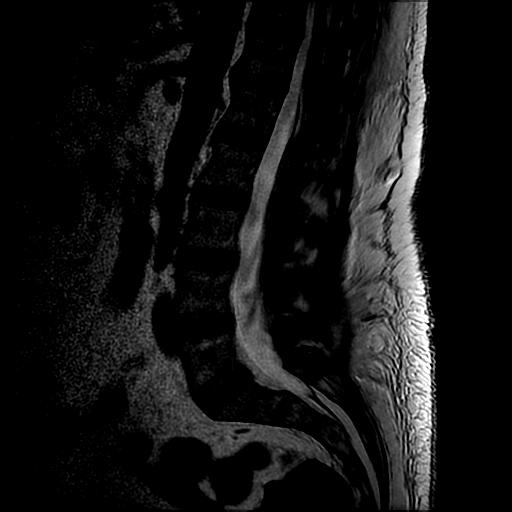
[im 14/14]
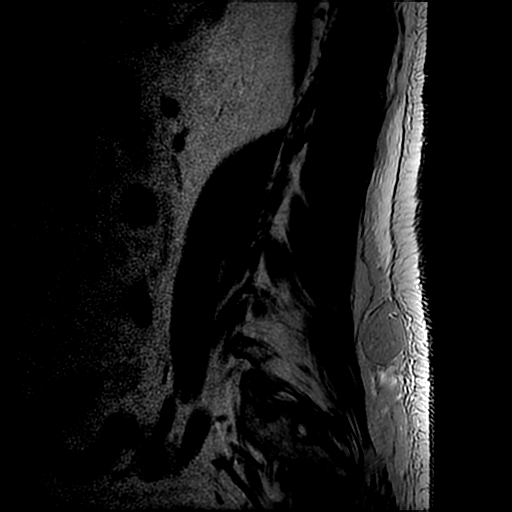

[Series 4: T1 · sagittal · 4.0mm · 0.55mm/px · 3 of 14 slices shown (1 of 2)]
[im 1/14]
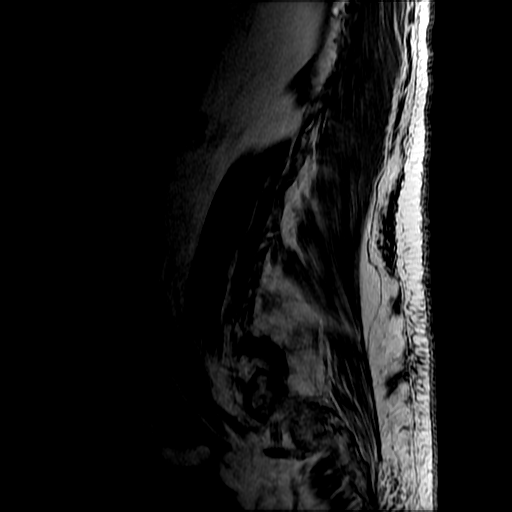
[im 9/14]
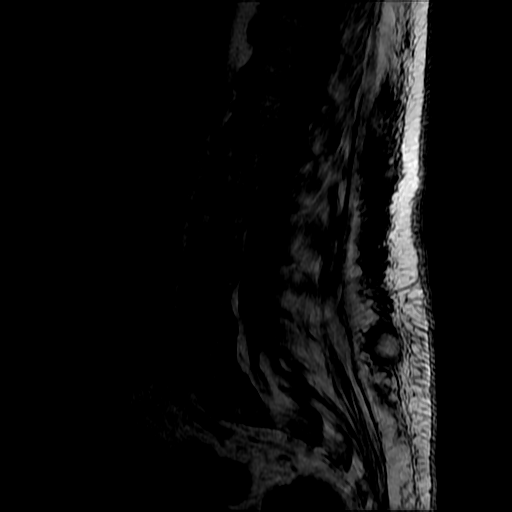
[im 14/14]
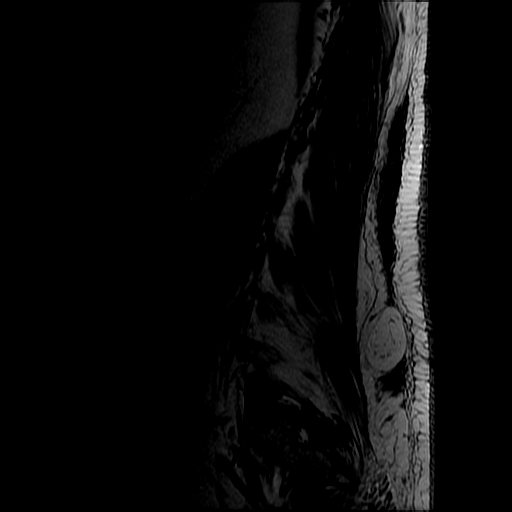

[Series 6: T2 · axial · 4.0mm · 0.39mm/px · z∈[-87,+114]mm · 10 of 40 slices shown (2 of 2)]
[im 1/40]
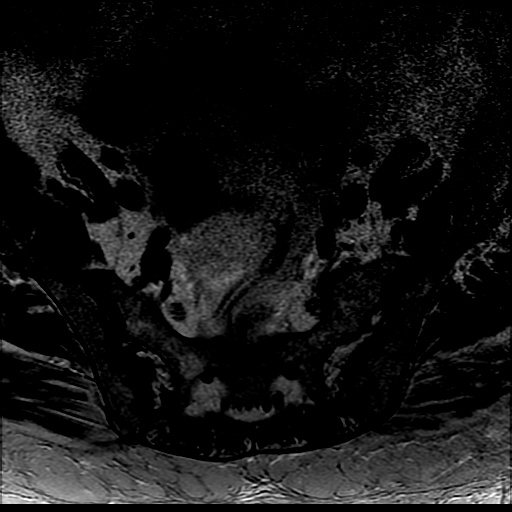
[im 4/40]
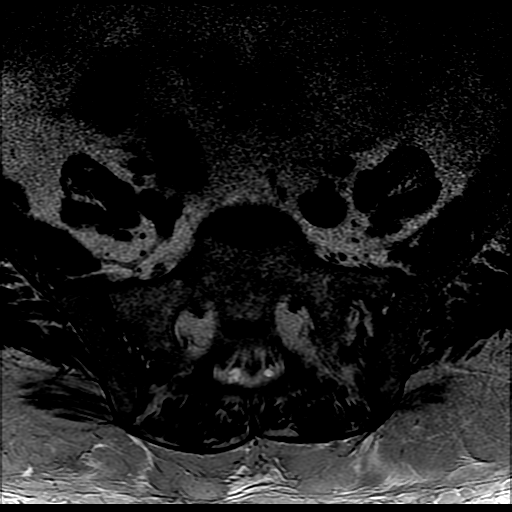
[im 8/40]
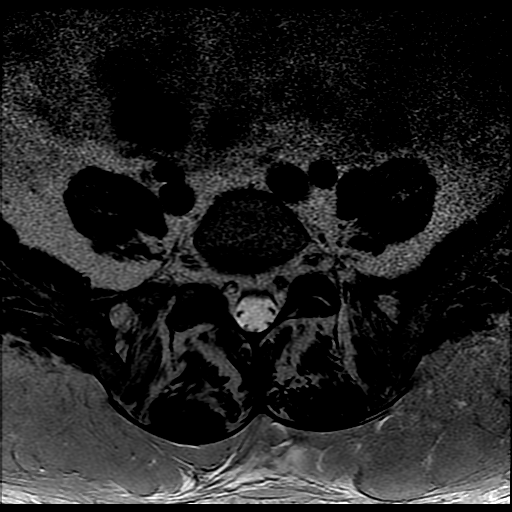
[im 12/40]
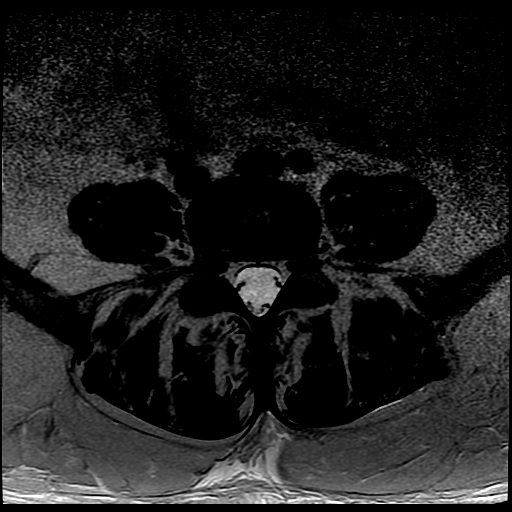
[im 16/40]
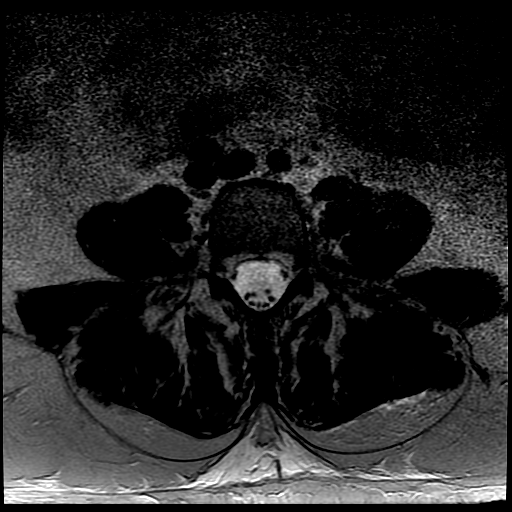
[im 20/40]
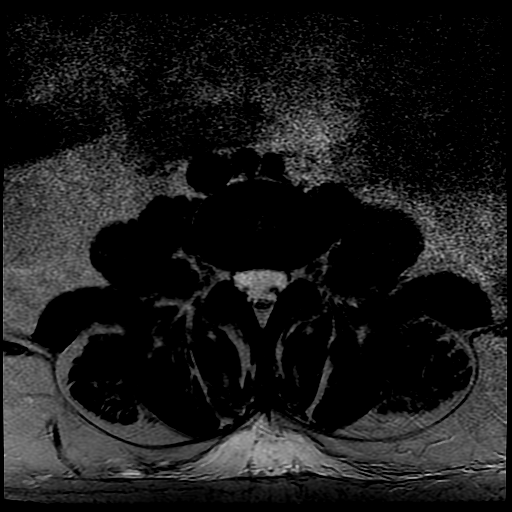
[im 24/40]
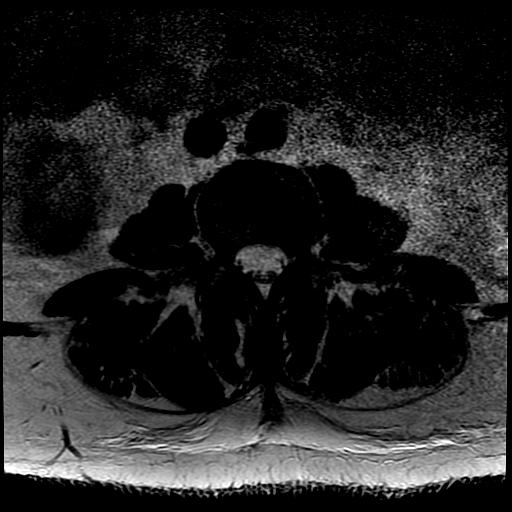
[im 28/40]
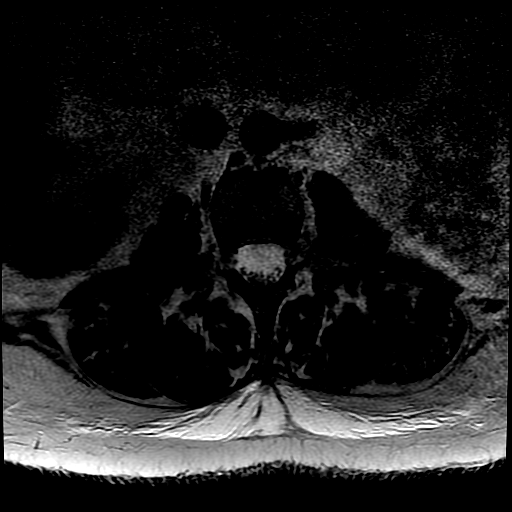
[im 32/40]
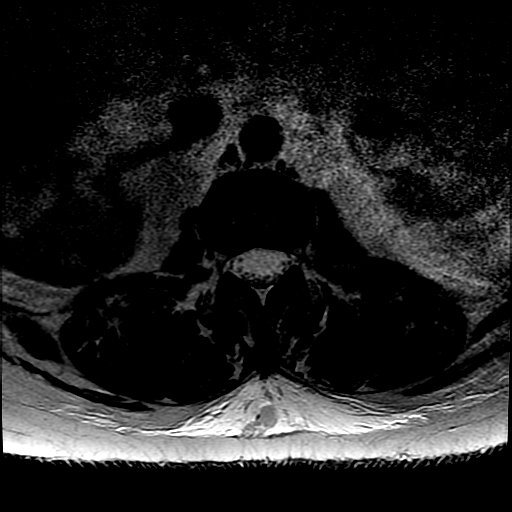
[im 36/40]
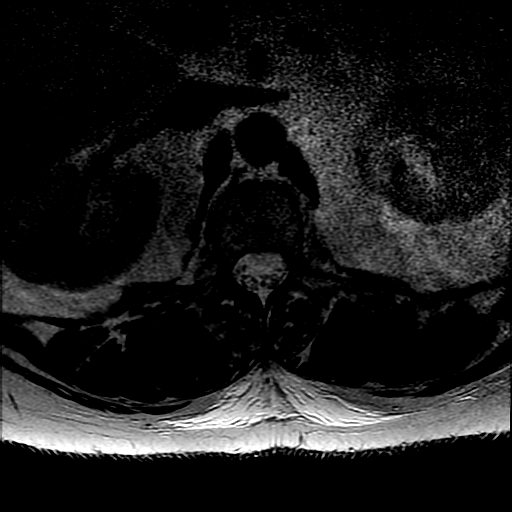

[Series 7: T1 · axial · 4.0mm · 0.39mm/px · z∈[-72,+114]mm · 3 of 40 slices shown (2 of 2)]
[im 4/40]
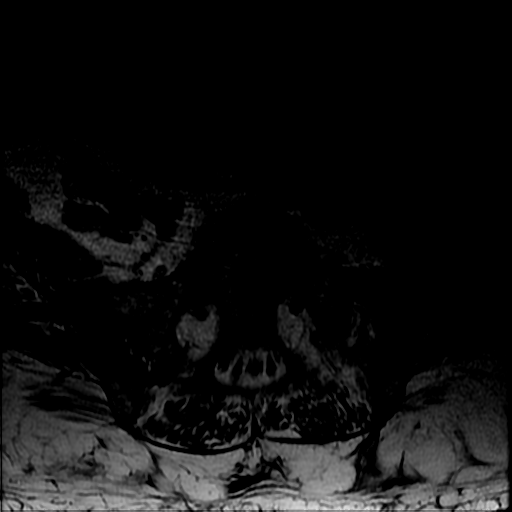
[im 20/40]
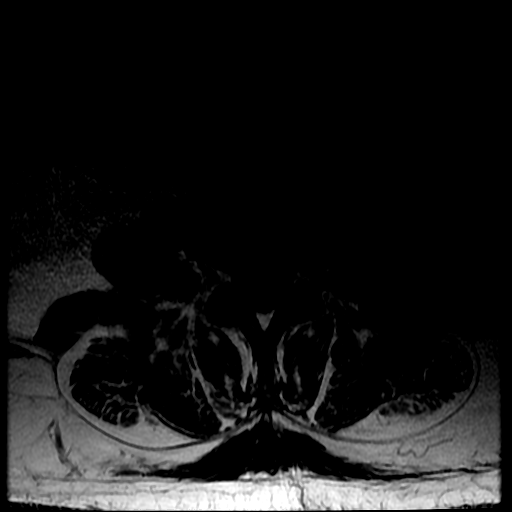
[im 36/40]
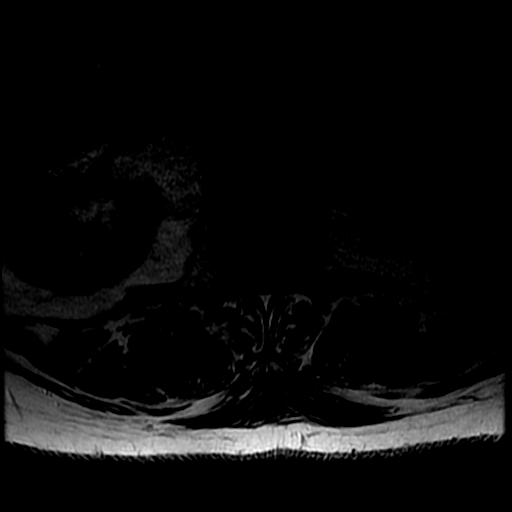

[19 of 48 positions shown; findings below may reference images not displayed]

FINDINGS: Normal lumbar segmentation depicted in [REDACTED]. Stable and normal
vertebral height and alignment since 4000. Visible bone marrow
signal is stable and normal. No marrow edema or evidence of acute
osseous abnormality.

Incidental low back subcutaneous edema. There may be a chronic left
posterior paraspinal lipoma (series 4, image 13), unchanged since
4000.

The bladder is distended. Motion artifact through the upper
abdominal viscera. Grossly stable left renal lower pole with post
treatment stranding. No retroperitoneal lymphadenopathy.

Visualized lower thoracic spinal cord is normal with conus medularis
at T12-L1. No abnormal intradural enhancement. Normal cauda equina.

T11-T12:  Negative.

T12-L1:  Negative.

L1-L2:  Negative.

L2-L3:  Negative.

L3-L4:  Negative.

L4-L5:  Negative except for mild facet hypertrophy.

L5-S1: Negative except for mild facet hypertrophy and epidural
lipomatosis. No stenosis.
IMPRESSION: 1. No acute or metastatic process in the lumbar spine. No acute
traumatic injury identified.
2. Normal discs. No significant degenerative change. No spinal
stenosis or neural impingement.

## 2015-05-18 IMAGING — CR DG HIP (WITH OR WITHOUT PELVIS) 2-3V*L*
3 series · 3 of 3 positions shown · non-contrast
Comparison: None.

CLINICAL DATA: Pain in left hip.  No known injury.

EXAM:
LEFT HIP - COMPLETE 2+ VIEW

[t pelvis ap]
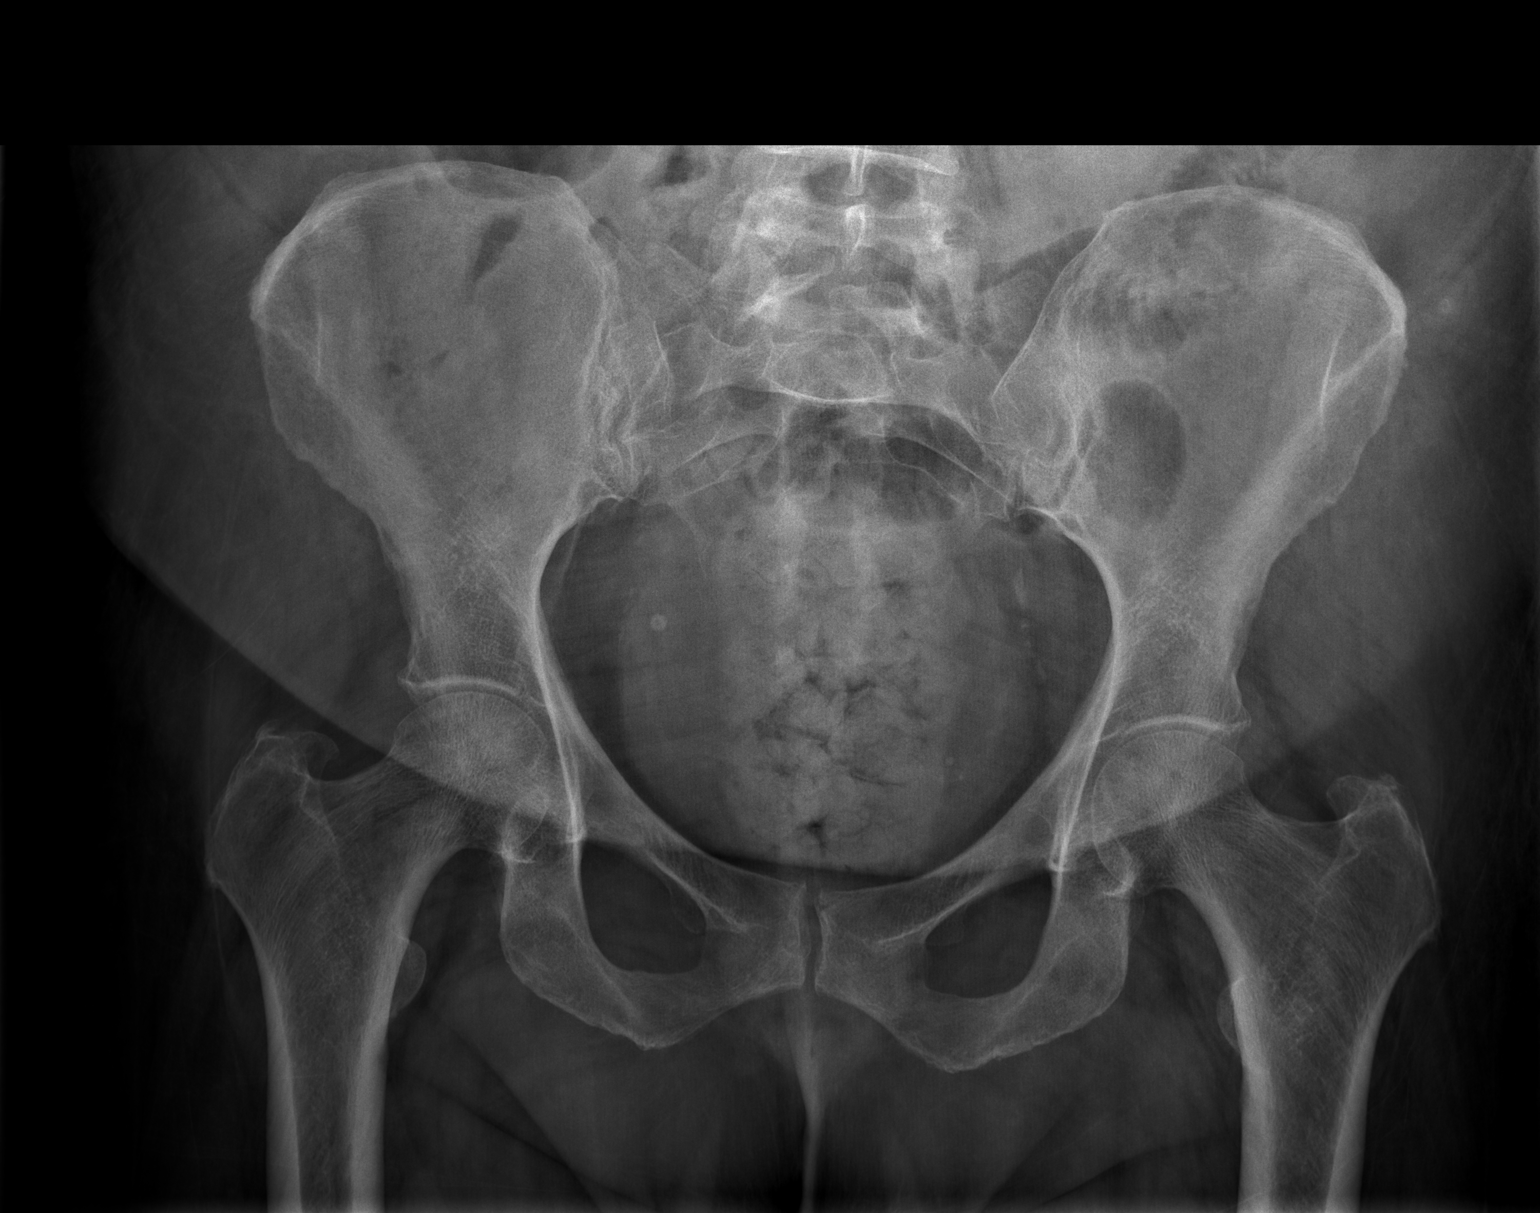

[t hip ap left]
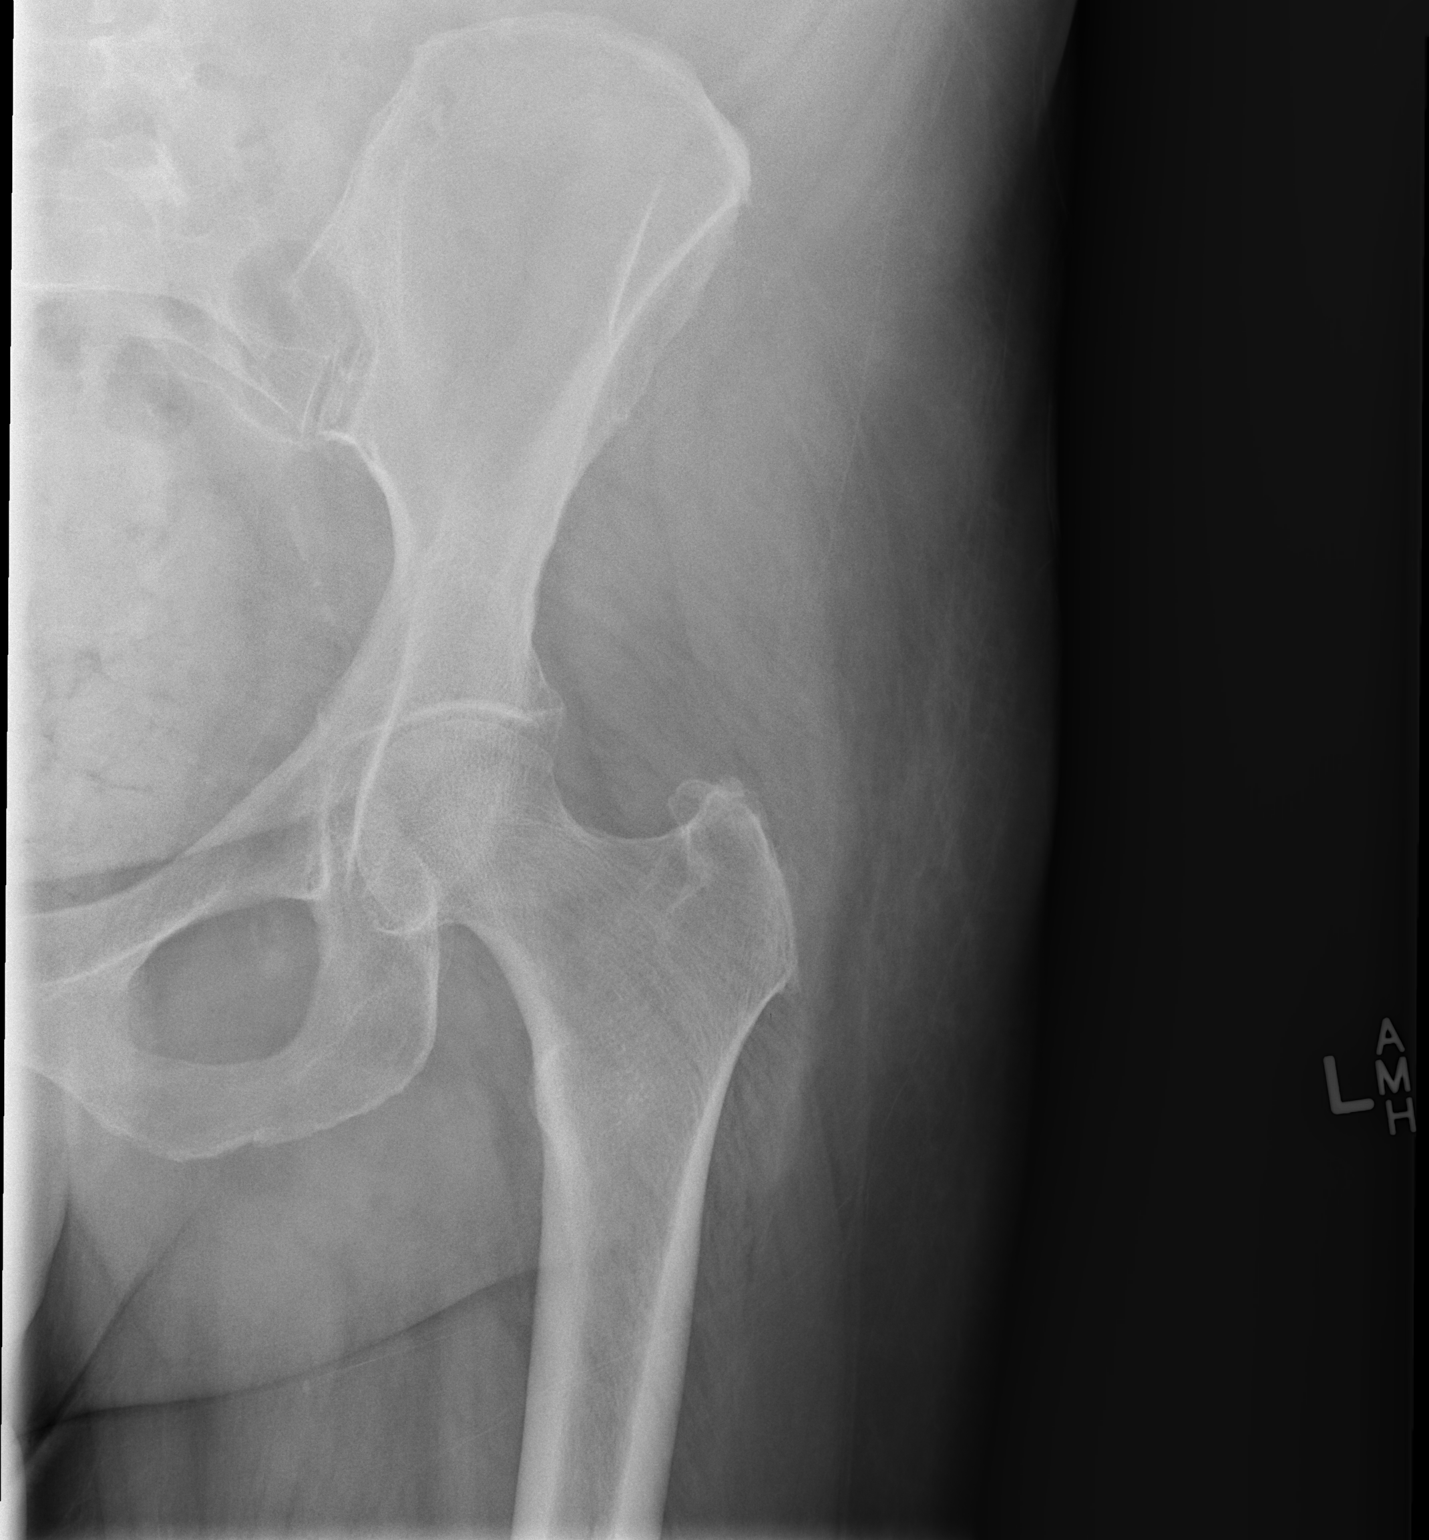

[t hip frog leg left]
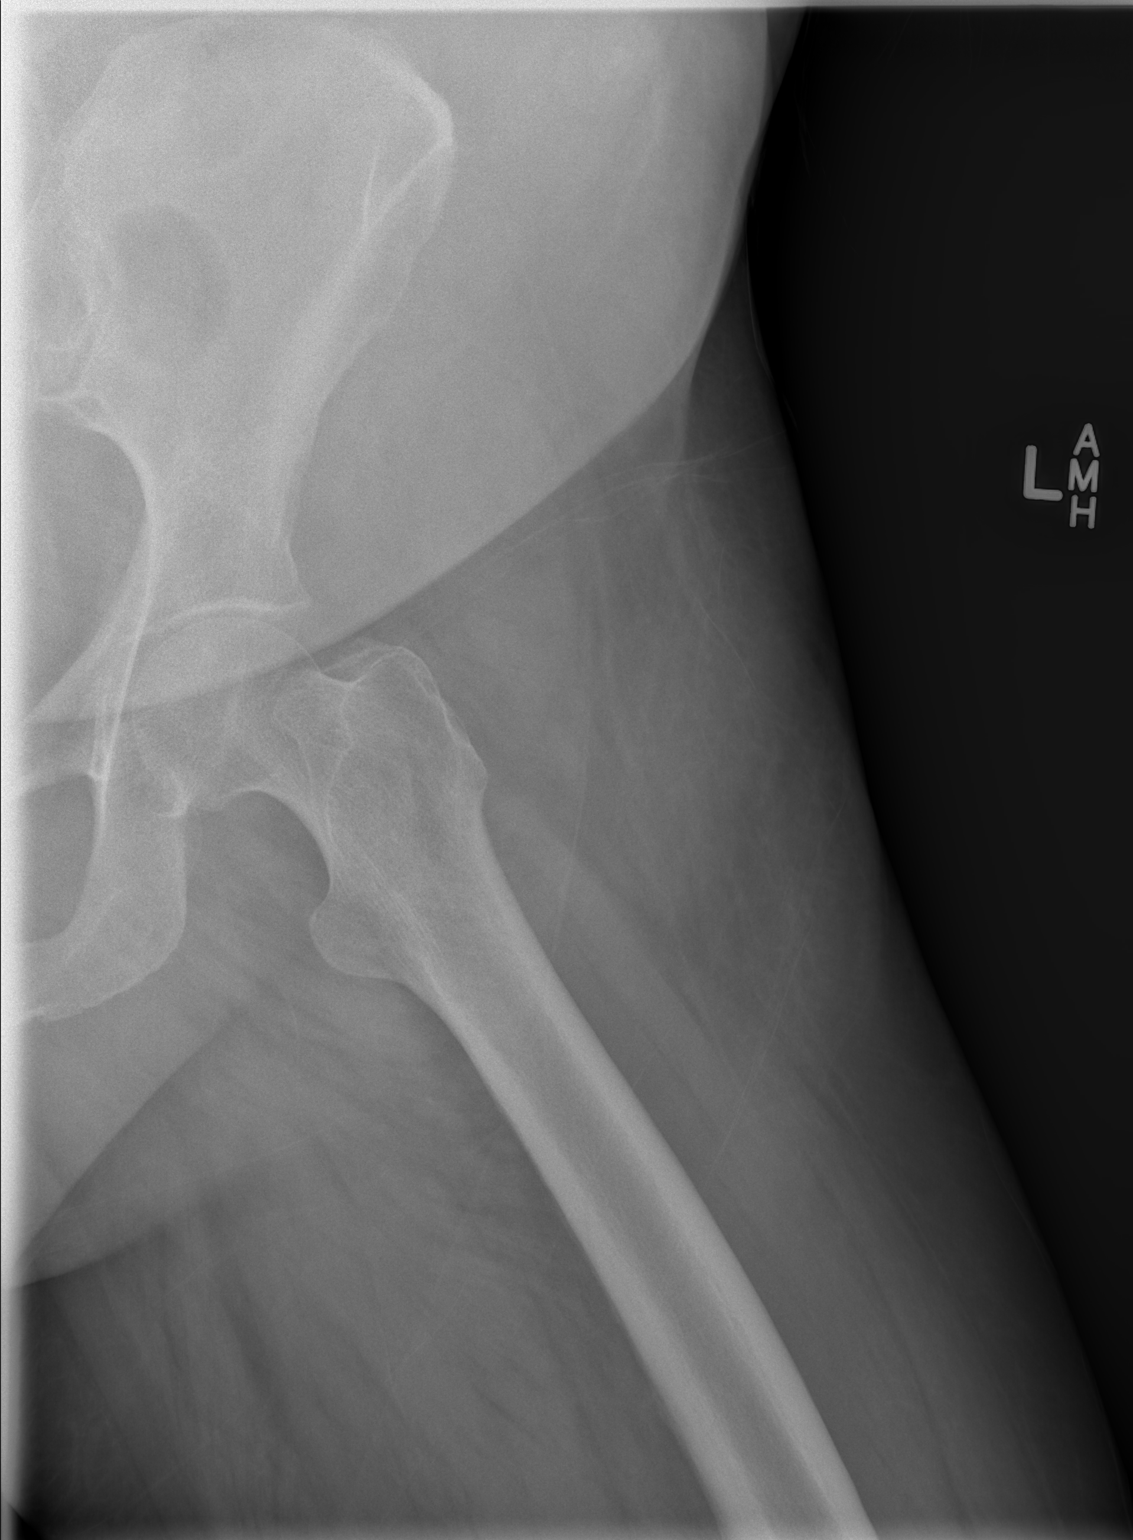

[3 of 3 positions shown; findings below may reference images not displayed]

FINDINGS: There is no evidence of hip fracture or dislocation. Left hip is
located. Slight joint space narrowing of both hips. Negative for
osteophyte formation. Sacroiliac joints and pubic symphysis appear
normal
IMPRESSION: No acute bony abnormality.

## 2015-05-21 ENCOUNTER — Encounter: Payer: Self-pay | Admitting: Licensed Clinical Social Worker

## 2015-05-21 ENCOUNTER — Other Ambulatory Visit: Payer: Self-pay | Admitting: Licensed Clinical Social Worker

## 2015-05-21 NOTE — Patient Outreach (Signed)
[facility]) Care Management  05/21/2015  [name] 03/15/1950 [id number]   Assessment-CSW received referral on 05/21/15 from [facility] for transportation and depression and completed outreach. CSW unable to reach patient. HIPPA compliant voice message left.  Plan-CSW will await return call or will complete an addition outreach.  Eula Fried, BSW, MSW, [email or web address] Phone: [phone] Fax: [phone]

## 2015-05-23 ENCOUNTER — Encounter: Payer: Self-pay | Admitting: Licensed Clinical Social Worker

## 2015-05-24 ENCOUNTER — Other Ambulatory Visit: Payer: Self-pay | Admitting: Licensed Clinical Social Worker

## 2015-05-24 ENCOUNTER — Encounter: Payer: Self-pay | Admitting: Licensed Clinical Social Worker

## 2015-05-24 ENCOUNTER — Other Ambulatory Visit: Payer: Self-pay

## 2015-05-24 NOTE — Patient Outreach (Signed)
[facility]) Care Management  05/24/2015  [name] 04-Aug-1950 [id number]   Assessment-CSW received return call from patient. HIPPA verifications provided. CSW introduced self, reason for initial call and [facility] social work services. Patient agreeable to social work services. Patient is in need of mental health resources and transportation resources. Patient lives in her home with her granddaughter and grand daughter's boyfriend. Patient has SCAT but is in need of SCAT passes as she is financially not able to afford them right now. CSW completed FAF but then became aware that RNCM had already completed FAF. CSW will gain SCAT bus passes for patient. Patient was educated on [employer]. Initially she shared that she would be agreeable to referral but then stated that she did not want to. Patient shares that she has been depressed for "several years" and is currently on medication. Patient does not have a therapist. [place] educated her on available mental health resources. Patient shared "I would not have a way there" when asked if she would be willing to see a therapist. CSW informed her that she could use SCAT but patient stated that she has to wait for long times for SCAT and has often received a no show because she did not know how long an appointment was and showed up late for the pick up time for SCAT. Patient is agreeable to home visit on 05/25/15.  Plan-CSW will send involvement letter to PCP. CSW will complete home visit next week.  Eula Fried, BSW, MSW, [email or web address] Phone: [phone] Fax: [phone]

## 2015-05-24 NOTE — Patient Outreach (Addendum)
Unsuccessful attempt made to contact patient via telephone at # [phone]. HIPPA compliant message left for patient with this RNCM's contact information.    Plan: Make another attempt to contact patient on Tuesday, April 18

## 2015-05-29 ENCOUNTER — Other Ambulatory Visit: Payer: Self-pay | Admitting: Licensed Clinical Social Worker

## 2015-05-29 ENCOUNTER — Other Ambulatory Visit: Payer: Self-pay | Admitting: Internal Medicine

## 2015-05-29 DIAGNOSIS — B373 Candidiasis of vulva and vagina: Secondary | ICD-10-CM

## 2015-05-29 DIAGNOSIS — B3731 Acute candidiasis of vulva and vagina: Secondary | ICD-10-CM
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
## 2015-05-30 NOTE — Progress Notes (Signed)
  This encounter was created in error - please disregard.

## 2015-05-31 ENCOUNTER — Other Ambulatory Visit: Payer: Self-pay | Admitting: Licensed Clinical Social Worker

## 2015-05-31 NOTE — Patient Outreach (Signed)
[facility]) Care Management  [facility] Care Manager  05/31/2015   [name] 12-14-1950 [id number]   Encounter Medications:  Outpatient Encounter Prescriptions as of 05/31/2015  Medication Sig Note  . albuterol (PROAIR HFA) 108 (90 BASE) MCG/ACT inhaler Inhale 1-2 puffs into the lungs every 6 (six) hours as needed for wheezing or shortness of breath.   Marland Kitchen alendronate (FOSAMAX) 70 MG tablet Take 1 tablet (70 mg total) by mouth once a week.   . ALPRAZolam (XANAX) 1 MG tablet Take 0.5-1 tablets (0.5-1 mg total) by mouth 2 (two) times daily as needed for anxiety.   . Blood Glucose Monitoring Suppl (ONETOUCH VERIO) W/DEVICE KIT 1 each by Does not apply route 4 (four) times daily.   Marland Kitchen buPROPion (WELLBUTRIN) 100 MG tablet Take 1 tablet (100 mg total) by mouth 2 (two) times daily.   . Calcium Carb-Cholecalciferol 600-800 MG-UNIT TABS Take 1 tablet by mouth 2 (two) times daily.    . chlorpheniramine (CHLOR-TRIMETON) 4 MG tablet Take 1 tablet (4 mg total) by mouth 3 (three) times daily as needed for allergies. 10/19/2014: ....  . diclofenac sodium (VOLTAREN) 1 % GEL Apply 2 g topically 4 (four) times daily as needed.   Marland Kitchen EASY TOUCH PEN NEEDLES 31G X 5 MM MISC USE TO INJECT INSULIN 5 TIMES DAILY   . fluticasone (FLONASE) 50 MCG/ACT nasal spray Place 2 sprays into both nostrils daily.   . Fluticasone-Salmeterol (ADVAIR DISKUS) 250-50 MCG/DOSE AEPB Inhale 1 puff into the lungs 2 (two) times daily.   . furosemide (LASIX) 80 MG tablet Take 1 tablet (80 mg total) by mouth daily.   Marland Kitchen gabapentin (NEURONTIN) 300 MG capsule Take 1 capsule (300 mg total) by mouth 3 (three) times daily.   Marland Kitchen HYDROcodone-acetaminophen (NORCO/VICODIN) 5-325 MG tablet Take 1-2 tablets by mouth every 6 (six) hours as needed for severe pain.   Marland Kitchen insulin aspart (NOVOLOG FLEXPEN) 100 UNIT/ML FlexPen Inject 18 Units into the skin 3 (three) times daily with meals.   . Insulin Detemir (LEVEMIR FLEXTOUCH) 100 UNIT/ML Pen  Inject 45 Units into the skin 2 (two) times daily.   . Lancets Misc. (ACCU-CHEK FASTCLIX LANCET) KIT Check your blood 4 times a day dx code 250.00 insulin requiring   . lidocaine (XYLOCAINE) 2 % jelly APPLY TO AFFECTED AREA THREE TIMES A DAY   . nystatin cream (MYCOSTATIN) Apply topically 2 (two) times daily.   Marland Kitchen omeprazole (PRILOSEC) 40 MG capsule Take 1 capsule (40 mg total) by mouth 2 (two) times daily.   [name] VERIO test strip Check blood sugar up to 4 times a day   . potassium chloride SA (K-DUR,KLOR-CON) 20 MEQ tablet Take 1 tablet (20 mEq total) by mouth 2 (two) times daily.   . rosuvastatin (CRESTOR) 20 MG tablet Take 1 tablet (20 mg total) by mouth at bedtime.   . sertraline (ZOLOFT) 100 MG tablet Take 2 tablets (200 mg total) by mouth daily.   Marland Kitchen tiotropium (SPIRIVA HANDIHALER) 18 MCG inhalation capsule Place 1 capsule (18 mcg total) into inhaler and inhale daily.   Marland Kitchen warfarin (COUMADIN) 5 MG tablet TAKE 1/2 TABLET BY MOUTH EVERY DAY AT 6PM ** EXCEPT ON MONDAYS TAKE 1 TABLET (5 MG)    No facility-administered encounter medications on file as of 05/31/2015.    Functional Status:  In your present state of health, do you have any difficulty performing the following activities: 05/18/2015 12/27/2014  Hearing? N N  Vision? N N  Difficulty concentrating  or making decisions? Y N  Walking or climbing stairs? Y N  Dressing or bathing? N N  Doing errands, shopping? Y N  Preparing Food and eating ? N -  Using the Toilet? N -  In the past six months, have you accidently leaked urine? N -  Do you have problems with loss of bowel control? N -  Managing your Medications? Y -  Managing your Finances? Y -  Housekeeping or managing your Housekeeping? Y -    Fall/Depression Screening: PHQ 2/9 Scores 05/29/2015 05/18/2015 12/25/2014 10/11/2014 10/06/2014 10/06/2014 06/28/2014  PHQ - 2 Score 4 3 5 6  0 0 6  PHQ- 9 Score 11 7 11 12  - - 12    Assessment: CSW went to home in order to complete home  visit and provide SCAT passes. CSW knocked on door and patient's granddaughter's boyfriend let CSW in. He went to try and wake up patient but said that she was sleeping. CSW continues to be concerned with patient's sleeping habits. CSW left 20 free SCAT passes with granddaughter's boyfriend to provide to patient.   Plan: CSW will complete outreach within two weeks to follow up on community resources.  Dickie La, BSW, MSW, LCSW [employer] [email or web address] Phone: [phone] Fax: [phone]

## 2015-06-04 ENCOUNTER — Other Ambulatory Visit: Payer: Self-pay | Admitting: Licensed Clinical Social Worker

## 2015-06-04 NOTE — Patient Outreach (Signed)
[name] Care Management  06/04/2015  [name] [birth year] [id number]   Assessment-CSW completed outreach to patient to follow up on provided SCAT passes. CSW unable to reach patient. CSW left a HIPPA compliant voice message.  Plan-CSW will await for return call and complete outreach within one week if no call has been made.  Eula Fried, BSW, MSW, [email or web address] Phone: [phone] Fax: [phone]

## 2015-06-05 ENCOUNTER — Telehealth: Payer: Self-pay | Admitting: *Deleted

## 2015-06-05 NOTE — Telephone Encounter (Signed)
This most likely is similar  objective tinnitus.  It is not dangerous if that is what is causing her symptoms.  Unfortunately, there is not much we can do about this unless any of the following questions give [place] a direction to go in.  Does she have a new headache, weakness, numbness, or change in her hearing?  Does she have a way to check her blood pressure and heart rate?  If so, please have her call us with these values.  Finally, what have her blood sugars been running?  Once I can get some of this information I should be able to determine if she needs an earlier appointment.  Thanks.

## 2015-06-05 NOTE — Telephone Encounter (Signed)
Called pt back: H/a, she denies any h/a's Weakness, she denies Numbness, she denies No changes in hearing She does not have any way to check her BP and HR, suggested she check when she goes grocery shopping, she may possibly do this if she remembers Blood sugar, she has not been checking her blood sugar in quite awhile, she states it was normal for awhile so she quit checking it, ask her to please start to check again at least until Friday and we would talk again Friday, she stated she might. Pt states she is depressed, not happy with her life at the moment with grdaughter and her boyfriend in the home also some old family issues have been bothering her. She states she does not want to take her own life, "i'm chicken" and she does not want to harm anyone else, she is encouraged to call any time she needs to talk or if she has feelings of self harm or harm to others, she was agreeable

## 2015-06-05 NOTE — Telephone Encounter (Signed)
Pt calls and states she is now for appr 1 week been hearing her heart beat in her head all the time, she states before she heard it occasionally now it is constant.she would like to know if this is serious? She states its irritating Please advise Next appt 5/19

## 2015-06-06 NOTE — Telephone Encounter (Signed)
Thanks again for the note.  If [name] wishes to wait until the scheduled appointment with me to address these acute on chronic problems we have to respect her wishes.  Given her responses, and knowing her as I do, I am not concerned that she would qualify for confinement or admission for an acute psychiatric illness.

## 2015-06-06 NOTE — Telephone Encounter (Signed)
Thank you for the information.  I will still need the blood pressure and pulse to determine if this may be serious, but thus far it does not appear to be.  I am scheduled to see her on May 19th.  Please call [name] back to determine if she wants an earlier appointment in the clinic with one of the clinic housestaff to address her chronic depression that sounds to be exacerbated.  Thanks.

## 2015-06-06 NOTE — Telephone Encounter (Signed)
i have called and not gotten an answer, lm for rtc, will call again this pm

## 2015-06-06 NOTE — Telephone Encounter (Signed)
Pt called back, she sounds groggy today, states she is not feeling any better,"this is no way to live but [name], i ain't going to do anything, i told you i'm chicken" she states she has not checked her blood sugar, ask her if maybe she could get her sister to pick up a home BP/HR monitor, she states "nah, shes too busy". She then tells me she fell last pm, states "its the strangest fall ive ever had, i ended up in a square" i ask if she needed to be seen, she refuses, says she will only see dr Eppie Gibson and that she isnt hurt bad. i assured her i would continue to check on her until her appt.

## 2015-06-08 ENCOUNTER — Other Ambulatory Visit: Payer: Self-pay

## 2015-06-08 ENCOUNTER — Other Ambulatory Visit: Payer: Self-pay | Admitting: Licensed Clinical Social Worker

## 2015-06-08 NOTE — Patient Outreach (Signed)
Unsuccessful attempt made to contact patient via telephone for community care coordination. HIPPA compliant message left with this RNCM's contact information at [phone].  Plan: Will make another attempt to contact patient next week to schedule home visit.

## 2015-06-08 NOTE — Patient Outreach (Addendum)
[place] [facility]) Care Management  06/08/2015  [name] 02-10-1951 [id number]   Assessment- CSW received return call from patient. Patient reports that she has SCAT passes. CSW expressed concerns for her ongoing chronic depression. Patient took suggestions and contacted PCP office to inform them of her recent health concerns and depression. Patient has PCP appointment next month. CSW provided emotional support and STRONGLY encouraged her to consider mental health resources that were provided to her during home visit. She shares that she has no energy and sleeps constantly. Patient denies suicidal ideations but admits "this is the worst I have been." CSW provided her with coping tools that she can implement into her routine that will not take as much energy (she does not wish to go out of house.) Patient is agreeable to implementing coping methods (reading a book, calling a family member to talk, coloring, playing with her dog and deep breathing. CSW will update PCP office by routing encounter.  Plan-CSW will follow up within one week. CSW will route encounter to PCP.  Eula Fried, BSW, MSW, [email or web address] Phone: [phone] Fax: [phone]

## 2015-06-08 NOTE — Patient Outreach (Signed)
[facility]) Care Management  06/08/2015  REISE HIETALA May 25, 1950 [id number]   Assessment- CSW completed outreach to patient. No answer. HIPPA compliant voice message left encouraging return call once available.  Plan-CSW will make additional outreach attempt within one week.  Eula Fried, BSW, MSW, [email or web address] Phone: [phone] Fax: [phone]

## 2015-06-11 ENCOUNTER — Other Ambulatory Visit: Payer: Self-pay

## 2015-06-12 ENCOUNTER — Other Ambulatory Visit: Payer: Self-pay

## 2015-06-12 NOTE — Patient Outreach (Signed)
Unsuccessful attempt made to contact patient via telephone at [phone] for community care coordination, schedule home visit.  Plan: Make another attempt to contact patient via telephone later this week for community care coordination

## 2015-06-12 NOTE — Patient Outreach (Signed)
Unsuccessful attempt made to contact  Patient via telephone foro community care coordination. HIPPA compliant message left for patient with this RNCM's contact infomration Will make another attempt on tomorrow, May 2

## 2015-06-13 ENCOUNTER — Other Ambulatory Visit: Payer: Self-pay

## 2015-06-13 NOTE — Patient Outreach (Signed)
This RNCM and patient made telephone contact after several unsuccessful attempts. Contact made for community care coordination and to schedule a home visit for diabetes education.  During this conversation the following was found: Patient states she sleeps most of the day, States she lives on [address], She can barely walk from her room to kitchen She has her teenage daughter and her boyfriend living in the home with he Patient states her income is $1175 monthly from [employer], states most goes for bills. Patient states she pays very little for her medications because she has Medicare Part D, has catastrophic coverage for medications.  patient admits not taking her medications, not because she cannot afford them, but feels she is not motivated to take her medications.  Patient states she is depressed, is not suicidal or homicidal and is open to psychosocial therapy.   Please see PHQ Screening results.   Plan: Home visit next week for further community care coordination need assessment, Update LCSW Eula Fried regarding need for psychosocial support, referral for community mental health provider.Marland Kitchen

## 2015-06-14 ENCOUNTER — Other Ambulatory Visit: Payer: Self-pay | Admitting: Licensed Clinical Social Worker

## 2015-06-14 NOTE — Patient Outreach (Signed)
[place] [facility]) Care Management  06/14/2015  [name] [birth year] [id number]   Assessment-CSW received cal from [facility] with updates. Patient continues to be unmotivated to take care of both health and mental health needs. CSW continues to encourage patient to address mental health needs by receiving mental health support with therapy but patient has denied mental health resources. CSW completed outreach to patient but was unsuccessful in reaching her. HIPPA compliant voice message left.  Plan-CSW will await for return call or complete additional outreach within 7 days.  Eula Fried, BSW, MSW, [email or web address] Phone: [phone] Fax: [phone]

## 2015-06-15 ENCOUNTER — Other Ambulatory Visit: Payer: Self-pay

## 2015-06-16 NOTE — Patient Outreach (Signed)
Telephone contact to schedule home visit next week. Patient identified himself by providing date of birth and address.  Patient and this RNCM agreed to home visit next week.

## 2015-06-18 ENCOUNTER — Ambulatory Visit: Payer: Self-pay

## 2015-06-19 ENCOUNTER — Other Ambulatory Visit: Payer: Self-pay | Admitting: Pharmacist

## 2015-06-19 ENCOUNTER — Ambulatory Visit: Payer: Self-pay

## 2015-06-19 NOTE — Patient Outreach (Signed)
[place] [name]) Care Management  06/19/2015  [name] [birth year] [id number]  [name] received referral from [place], [facility] Telephonic on patient.  [name], [facility] RN Community and [place], [place] are also involved in patient's care.   Attempted to reach patient via phone today.  Unsuccessful attempt one.  Left a HIPAA compliant message requesting a call back.   Plan:  Will attempt to reach patient again within about 1 week if no return call.   Karrie Meres, PharmD, [place] [phone]

## 2015-06-20 ENCOUNTER — Other Ambulatory Visit: Payer: Self-pay | Admitting: Licensed Clinical Social Worker

## 2015-06-20 NOTE — Patient Outreach (Signed)
[place] [facility]) Care Management  06/20/2015  [name] August 01, 1950 [id number]   Assessment-CSW received voice message from patient. CSW returned call but was unable to reach her. HIPPA compliant voice message left.  Plan-CSW will await return call or complete additional outreach within one week.  Eula Fried, BSW, MSW, [email or web address] Phone: [phone] Fax: [phone]

## 2015-06-21 ENCOUNTER — Telehealth: Payer: Self-pay | Admitting: *Deleted

## 2015-06-21 ENCOUNTER — Other Ambulatory Visit: Payer: Self-pay

## 2015-06-21 NOTE — Telephone Encounter (Signed)
Pt calls and states she is very disappointed that [facility] has not shown for 2 appts., at this time she does not wish to have their services. She states she called because family members had come to her home for the initial meeting and waited for someone to arrive, when they didn't she called, spoke with someone named michael and he didn't tell her really anything.

## 2015-06-21 NOTE — Patient Outreach (Signed)
Received message from [name], Administrative Assistant [facility] advising this RNCM he received a call from patient canceling tomorrow's appointment. Unsuccessful attempt made to contact patient via telephone to reschedule appointment.  Plan: Await return call from patient, If no return call by end of next week, make another attempt to contact patient.

## 2015-06-22 ENCOUNTER — Ambulatory Visit: Payer: Self-pay

## 2015-06-22 ENCOUNTER — Other Ambulatory Visit: Payer: Self-pay | Admitting: Licensed Clinical Social Worker

## 2015-06-22 NOTE — Patient Outreach (Signed)
[facility]) Care Management  06/22/2015  TISHA CLINE May 02, 1950 [id number]   Assessment-CSW completed outreach attempt today. CSW unable to reach patient successfully. CSW left a HIPPA compliant voice message encouraging patient to return call once available.  Plan-CSW will await return call or complete an additional outreach if needed.  Eula Fried, BSW, MSW, [email or web address] Phone: [phone] Fax: [phone]

## 2015-06-25 ENCOUNTER — Other Ambulatory Visit: Payer: Self-pay | Admitting: Licensed Clinical Social Worker

## 2015-06-25 ENCOUNTER — Ambulatory Visit: Payer: Self-pay

## 2015-06-25 NOTE — Patient Outreach (Signed)
[place] [facility]) Care Management  06/25/2015  WESLYNN KE 1950-06-09 [id number]   Assessment- CSW completed outreach to patient on 06/25/15 but was unable to reach her. CSW left a HIPPA compliant voice message requesting that she return call once available.  Plan-CSW will await for return call or will complete another outreach.  Eula Fried, BSW, MSW, [email or web address] Phone: [phone] Fax: [phone]

## 2015-06-26 ENCOUNTER — Telehealth: Payer: Self-pay | Admitting: *Deleted

## 2015-06-26 ENCOUNTER — Other Ambulatory Visit: Payer: Self-pay | Admitting: Internal Medicine

## 2015-06-26 DIAGNOSIS — I480 Paroxysmal atrial fibrillation: Secondary | ICD-10-CM

## 2015-06-26 DIAGNOSIS — I5032 Chronic diastolic (congestive) heart failure: Secondary | ICD-10-CM

## 2015-06-26 NOTE — Telephone Encounter (Signed)
[name] has had multiple complaints for a while now and has been uninterested in offered appointment visits to evaluate.  I am glad she is now willing to come in for an evaluation.

## 2015-06-26 NOTE — Telephone Encounter (Signed)
Pt calls and states she thinks there is "more going on than just being depressed" she thinks she is getting severely anemic, states she is sleeping almost all the time. States she is starting to need to use her 02 while at home (sister states she has just started to use 02 at home even though she has had 02 for a while now), she states she is starting to have a dull h/a all the time,she denies chest pain, denies seeing blood in stool. she is ask to consider going to ED but prefers an appt in clinic, sister states she will bring pt to clinic tomorrow, appt made w/ dr Ronnald Ramp 1315 5/17. Both are cautioned if pt becomes worse to come to ED, both agreeable

## 2015-06-27 ENCOUNTER — Emergency Department (HOSPITAL_COMMUNITY): Payer: PPO

## 2015-06-27 ENCOUNTER — Encounter: Payer: Self-pay | Admitting: Licensed Clinical Social Worker

## 2015-06-27 ENCOUNTER — Other Ambulatory Visit (HOSPITAL_COMMUNITY): Payer: Self-pay

## 2015-06-27 ENCOUNTER — Encounter (HOSPITAL_COMMUNITY): Payer: Self-pay | Admitting: *Deleted

## 2015-06-27 ENCOUNTER — Inpatient Hospital Stay (HOSPITAL_COMMUNITY)
Admission: EM | Admit: 2015-06-27 | Discharge: 2015-07-03 | DRG: 812 | Disposition: A | Discharge status: Home / Self Care | Payer: PPO | Attending: Internal Medicine | Admitting: Internal Medicine

## 2015-06-27 ENCOUNTER — Ambulatory Visit: Payer: PPO | Admitting: Internal Medicine

## 2015-06-27 ENCOUNTER — Other Ambulatory Visit: Payer: Self-pay | Admitting: Pharmacist

## 2015-06-27 ENCOUNTER — Other Ambulatory Visit: Payer: Self-pay

## 2015-06-27 ENCOUNTER — Other Ambulatory Visit: Payer: Self-pay | Admitting: Licensed Clinical Social Worker

## 2015-06-27 ENCOUNTER — Telehealth: Payer: Self-pay | Admitting: *Deleted

## 2015-06-27 DIAGNOSIS — F418 Other specified anxiety disorders: Secondary | ICD-10-CM

## 2015-06-27 DIAGNOSIS — K5520 Angiodysplasia of colon without hemorrhage: Secondary | ICD-10-CM | POA: Diagnosis present

## 2015-06-27 DIAGNOSIS — D649 Anemia, unspecified: Secondary | ICD-10-CM | POA: Diagnosis not present

## 2015-06-27 DIAGNOSIS — I509 Heart failure, unspecified: Secondary | ICD-10-CM | POA: Diagnosis not present

## 2015-06-27 DIAGNOSIS — Z794 Long term (current) use of insulin: Secondary | ICD-10-CM | POA: Diagnosis not present

## 2015-06-27 DIAGNOSIS — K219 Gastro-esophageal reflux disease without esophagitis: Secondary | ICD-10-CM

## 2015-06-27 DIAGNOSIS — E118 Type 2 diabetes mellitus with unspecified complications: Secondary | ICD-10-CM | POA: Diagnosis present

## 2015-06-27 DIAGNOSIS — E1169 Type 2 diabetes mellitus with other specified complication: Secondary | ICD-10-CM | POA: Diagnosis present

## 2015-06-27 DIAGNOSIS — F17210 Nicotine dependence, cigarettes, uncomplicated: Secondary | ICD-10-CM | POA: Diagnosis not present

## 2015-06-27 DIAGNOSIS — Z85528 Personal history of other malignant neoplasm of kidney: Secondary | ICD-10-CM | POA: Diagnosis not present

## 2015-06-27 DIAGNOSIS — G4733 Obstructive sleep apnea (adult) (pediatric): Secondary | ICD-10-CM | POA: Diagnosis not present

## 2015-06-27 DIAGNOSIS — I809 Phlebitis and thrombophlebitis of unspecified site: Secondary | ICD-10-CM | POA: Diagnosis present

## 2015-06-27 DIAGNOSIS — E119 Type 2 diabetes mellitus without complications: Secondary | ICD-10-CM

## 2015-06-27 DIAGNOSIS — I480 Paroxysmal atrial fibrillation: Secondary | ICD-10-CM

## 2015-06-27 DIAGNOSIS — Z9889 Other specified postprocedural states: Secondary | ICD-10-CM | POA: Diagnosis not present

## 2015-06-27 DIAGNOSIS — J449 Chronic obstructive pulmonary disease, unspecified: Secondary | ICD-10-CM | POA: Diagnosis not present

## 2015-06-27 DIAGNOSIS — Z7901 Long term (current) use of anticoagulants: Secondary | ICD-10-CM | POA: Diagnosis not present

## 2015-06-27 DIAGNOSIS — E1140 Type 2 diabetes mellitus with diabetic neuropathy, unspecified: Secondary | ICD-10-CM | POA: Diagnosis not present

## 2015-06-27 DIAGNOSIS — K222 Esophageal obstruction: Secondary | ICD-10-CM | POA: Diagnosis not present

## 2015-06-27 DIAGNOSIS — K921 Melena: Secondary | ICD-10-CM | POA: Diagnosis present

## 2015-06-27 DIAGNOSIS — D500 Iron deficiency anemia secondary to blood loss (chronic): Secondary | ICD-10-CM | POA: Diagnosis not present

## 2015-06-27 DIAGNOSIS — R0602 Shortness of breath: Secondary | ICD-10-CM | POA: Diagnosis not present

## 2015-06-27 DIAGNOSIS — M545 Low back pain: Secondary | ICD-10-CM

## 2015-06-27 DIAGNOSIS — I808 Phlebitis and thrombophlebitis of other sites: Secondary | ICD-10-CM | POA: Diagnosis not present

## 2015-06-27 DIAGNOSIS — I272 Other secondary pulmonary hypertension: Secondary | ICD-10-CM | POA: Diagnosis not present

## 2015-06-27 DIAGNOSIS — I5032 Chronic diastolic (congestive) heart failure: Secondary | ICD-10-CM | POA: Diagnosis not present

## 2015-06-27 DIAGNOSIS — K922 Gastrointestinal hemorrhage, unspecified: Secondary | ICD-10-CM | POA: Diagnosis not present

## 2015-06-27 DIAGNOSIS — M797 Fibromyalgia: Secondary | ICD-10-CM | POA: Diagnosis not present

## 2015-06-27 DIAGNOSIS — K5900 Constipation, unspecified: Secondary | ICD-10-CM | POA: Diagnosis not present

## 2015-06-27 DIAGNOSIS — G8929 Other chronic pain: Secondary | ICD-10-CM | POA: Diagnosis not present

## 2015-06-27 DIAGNOSIS — R05 Cough: Secondary | ICD-10-CM | POA: Diagnosis not present

## 2015-06-27 DIAGNOSIS — Z953 Presence of xenogenic heart valve: Secondary | ICD-10-CM | POA: Diagnosis not present

## 2015-06-27 DIAGNOSIS — E66811 Obesity, class 1: Secondary | ICD-10-CM | POA: Diagnosis present

## 2015-06-27 DIAGNOSIS — F321 Major depressive disorder, single episode, moderate: Secondary | ICD-10-CM | POA: Diagnosis present

## 2015-06-27 DIAGNOSIS — F329 Major depressive disorder, single episode, unspecified: Secondary | ICD-10-CM | POA: Diagnosis not present

## 2015-06-27 DIAGNOSIS — D509 Iron deficiency anemia, unspecified: Secondary | ICD-10-CM | POA: Diagnosis not present

## 2015-06-27 DIAGNOSIS — F322 Major depressive disorder, single episode, severe without psychotic features: Secondary | ICD-10-CM | POA: Diagnosis present

## 2015-06-27 DIAGNOSIS — M810 Age-related osteoporosis without current pathological fracture: Secondary | ICD-10-CM | POA: Diagnosis not present

## 2015-06-27 DIAGNOSIS — K449 Diaphragmatic hernia without obstruction or gangrene: Secondary | ICD-10-CM | POA: Diagnosis not present

## 2015-06-27 DIAGNOSIS — Z7982 Long term (current) use of aspirin: Secondary | ICD-10-CM

## 2015-06-27 DIAGNOSIS — Z9981 Dependence on supplemental oxygen: Secondary | ICD-10-CM

## 2015-06-27 DIAGNOSIS — E669 Obesity, unspecified: Secondary | ICD-10-CM | POA: Diagnosis not present

## 2015-06-27 DIAGNOSIS — Z6830 Body mass index (BMI) 30.0-30.9, adult: Secondary | ICD-10-CM

## 2015-06-27 DIAGNOSIS — F419 Anxiety disorder, unspecified: Secondary | ICD-10-CM | POA: Diagnosis present

## 2015-06-27 DIAGNOSIS — R069 Unspecified abnormalities of breathing: Secondary | ICD-10-CM | POA: Diagnosis not present

## 2015-06-27 DIAGNOSIS — E785 Hyperlipidemia, unspecified: Secondary | ICD-10-CM | POA: Diagnosis not present

## 2015-06-27 DIAGNOSIS — I5033 Acute on chronic diastolic (congestive) heart failure: Secondary | ICD-10-CM | POA: Diagnosis present

## 2015-06-27 LAB — CBC WITH DIFFERENTIAL/PLATELET
Basophils Absolute: 0 10*3/uL (ref 0.0–0.1)
Basophils Relative: 0 %
Eosinophils Absolute: 0 10*3/uL (ref 0.0–0.7)
Eosinophils Relative: 0 %
HCT: 13.5 % — ABNORMAL LOW (ref 36.0–46.0)
Hemoglobin: 3.6 g/dL — CL (ref 12.0–15.0)
Lymphocytes Relative: 10 %
Lymphs Abs: 0.9 10*3/uL (ref 0.7–4.0)
MCH: 18.2 pg — ABNORMAL LOW (ref 26.0–34.0)
MCHC: 26.7 g/dL — ABNORMAL LOW (ref 30.0–36.0)
MCV: 68.2 fL — ABNORMAL LOW (ref 78.0–100.0)
Monocytes Absolute: 1.2 10*3/uL — ABNORMAL HIGH (ref 0.1–1.0)
Monocytes Relative: 13 %
Neutro Abs: 6.9 10*3/uL (ref 1.7–7.7)
Neutrophils Relative %: 77 %
Platelets: 329 10*3/uL (ref 150–400)
RBC: 1.98 MIL/uL — ABNORMAL LOW (ref 3.87–5.11)
RDW: 20 % — ABNORMAL HIGH (ref 11.5–15.5)
WBC: 9 10*3/uL (ref 4.0–10.5)

## 2015-06-27 LAB — BASIC METABOLIC PANEL
Anion gap: 9 (ref 5–15)
BUN: 13 mg/dL (ref 6–20)
CO2: 21 mmol/L — ABNORMAL LOW (ref 22–32)
Calcium: 8.5 mg/dL — ABNORMAL LOW (ref 8.9–10.3)
Chloride: 106 mmol/L (ref 101–111)
Creatinine, Ser: 0.94 mg/dL (ref 0.44–1.00)
GFR calc Af Amer: >60 mL/min (ref >60)
GFR calc non Af Amer: >60 mL/min (ref >60)
Glucose, Bld: 355 mg/dL — ABNORMAL HIGH (ref 65–99)
Potassium: 4.4 mmol/L (ref 3.5–5.1)
Sodium: 136 mmol/L (ref 135–145)

## 2015-06-27 LAB — DIRECT ANTIGLOBULIN TEST (NOT AT ARMC)
DAT, IgG: NEGATIVE
DAT, complement: NEGATIVE

## 2015-06-27 LAB — PROTIME-INR
INR: 3.76 — ABNORMAL HIGH (ref 0.00–1.49)
Prothrombin Time: 36.2 seconds — ABNORMAL HIGH (ref 11.6–15.2)

## 2015-06-27 LAB — PREPARE RBC (CROSSMATCH)

## 2015-06-27 LAB — GLUCOSE, CAPILLARY
Glucose-Capillary: 333 mg/dL — ABNORMAL HIGH (ref 65–99)
Glucose-Capillary: 245 mg/dL — ABNORMAL HIGH (ref 65–99)
Glucose-Capillary: 189 mg/dL — ABNORMAL HIGH (ref 65–99)

## 2015-06-27 LAB — TROPONIN I: Troponin I: <0.03 ng/mL (ref <0.031)

## 2015-06-27 LAB — POC OCCULT BLOOD, ED: Fecal Occult Bld: POSITIVE — AB

## 2015-06-27 LAB — I-STAT TROPONIN, ED: Troponin i, poc: 0.01 ng/mL (ref 0.00–0.08)

## 2015-06-27 LAB — MRSA PCR SCREENING: MRSA by PCR: NEGATIVE

## 2015-06-27 LAB — BRAIN NATRIURETIC PEPTIDE: B Natriuretic Peptide: 383.9 pg/mL — ABNORMAL HIGH (ref 0.0–100.0)

## 2015-06-27 MED ORDER — INSULIN DETEMIR 100 UNIT/ML ~~LOC~~ SOLN
20.0000 [IU] | Freq: Two times a day (BID) | SUBCUTANEOUS | Status: DC
  Administered 2015-06-27 (×2): 20 [IU] via SUBCUTANEOUS
  Filled 2015-06-27 (×4): qty 0.2

## 2015-06-27 MED ORDER — PANTOPRAZOLE SODIUM 40 MG IV SOLR
40.0000 mg | Freq: Two times a day (BID) | INTRAVENOUS | Status: DC
  Administered 2015-06-27 – 2015-07-03 (×13): 40 mg via INTRAVENOUS
  Filled 2015-06-27 (×15): qty 40

## 2015-06-27 MED ORDER — IPRATROPIUM-ALBUTEROL 0.5-2.5 (3) MG/3ML IN SOLN
3.0000 mL | RESPIRATORY_TRACT | Status: AC
  Administered 2015-06-27 (×3): 3 mL via RESPIRATORY_TRACT
  Filled 2015-06-27: qty 9

## 2015-06-27 MED ORDER — PANTOPRAZOLE SODIUM 40 MG IV SOLR
40.0000 mg | Freq: Once | INTRAVENOUS | Status: AC
  Administered 2015-06-27: 40 mg via INTRAVENOUS
  Filled 2015-06-27: qty 40

## 2015-06-27 MED ORDER — METOCLOPRAMIDE HCL 5 MG/ML IJ SOLN
5.0000 mg | Freq: Once | INTRAMUSCULAR | Status: AC
  Administered 2015-06-27: 5 mg via INTRAVENOUS
  Filled 2015-06-27: qty 2

## 2015-06-27 MED ORDER — INSULIN ASPART 100 UNIT/ML ~~LOC~~ SOLN
0.0000 [IU] | Freq: Every day | SUBCUTANEOUS | Status: DC
  Administered 2015-06-29: 3 [IU] via SUBCUTANEOUS
  Administered 2015-07-02: 2 [IU] via SUBCUTANEOUS

## 2015-06-27 MED ORDER — SODIUM CHLORIDE 0.9 % IV SOLN
8.0000 mg | Freq: Four times a day (QID) | INTRAVENOUS | Status: DC | PRN
  Administered 2015-06-28 – 2015-07-02 (×8): 8 mg via INTRAVENOUS
  Filled 2015-06-27 (×15): qty 4

## 2015-06-27 MED ORDER — MOMETASONE FURO-FORMOTEROL FUM 200-5 MCG/ACT IN AERO
2.0000 | INHALATION_SPRAY | Freq: Two times a day (BID) | RESPIRATORY_TRACT | Status: DC
  Administered 2015-06-27 – 2015-07-03 (×12): 2 via RESPIRATORY_TRACT
  Filled 2015-06-27: qty 8.8

## 2015-06-27 MED ORDER — ALBUTEROL SULFATE (2.5 MG/3ML) 0.083% IN NEBU
3.0000 mL | INHALATION_SOLUTION | Freq: Four times a day (QID) | RESPIRATORY_TRACT | Status: DC | PRN
  Administered 2015-06-27: 3 mL via RESPIRATORY_TRACT
  Filled 2015-06-27: qty 3

## 2015-06-27 MED ORDER — ALPRAZOLAM 0.25 MG PO TABS
0.5000 mg | ORAL_TABLET | Freq: Two times a day (BID) | ORAL | Status: DC | PRN
  Administered 2015-06-27 – 2015-07-02 (×11): 1 mg via ORAL
  Filled 2015-06-27 (×11): qty 4

## 2015-06-27 MED ORDER — HYDROCODONE-ACETAMINOPHEN 5-325 MG PO TABS
1.0000 | ORAL_TABLET | Freq: Four times a day (QID) | ORAL | Status: DC | PRN
  Administered 2015-06-27 – 2015-07-02 (×10): 2 via ORAL
  Administered 2015-07-02: 1 via ORAL
  Administered 2015-07-02: 2 via ORAL
  Filled 2015-06-27 (×3): qty 2
  Filled 2015-06-27: qty 1
  Filled 2015-06-27 (×9): qty 2
  Filled 2015-06-27: qty 1

## 2015-06-27 MED ORDER — FLUTICASONE PROPIONATE 50 MCG/ACT NA SUSP
2.0000 | Freq: Every day | NASAL | Status: DC
  Administered 2015-06-28 – 2015-07-03 (×5): 2 via NASAL
  Filled 2015-06-27: qty 16

## 2015-06-27 MED ORDER — INSULIN ASPART 100 UNIT/ML ~~LOC~~ SOLN
0.0000 [IU] | Freq: Three times a day (TID) | SUBCUTANEOUS | Status: DC
  Administered 2015-06-28: 1 [IU] via SUBCUTANEOUS
  Administered 2015-06-28: 3 [IU] via SUBCUTANEOUS
  Administered 2015-06-29: 2 [IU] via SUBCUTANEOUS
  Administered 2015-06-29: 1 [IU] via SUBCUTANEOUS
  Administered 2015-06-29: 3 [IU] via SUBCUTANEOUS
  Administered 2015-06-30: 2 [IU] via SUBCUTANEOUS
  Administered 2015-06-30: 3 [IU] via SUBCUTANEOUS
  Administered 2015-06-30: 1 [IU] via SUBCUTANEOUS
  Administered 2015-07-01: 3 [IU] via SUBCUTANEOUS
  Administered 2015-07-01 (×2): 2 [IU] via SUBCUTANEOUS
  Administered 2015-07-02: 3 [IU] via SUBCUTANEOUS
  Administered 2015-07-02: 1 [IU] via SUBCUTANEOUS
  Administered 2015-07-02: 3 [IU] via SUBCUTANEOUS
  Administered 2015-07-03: 5 [IU] via SUBCUTANEOUS
  Administered 2015-07-03: 2 [IU] via SUBCUTANEOUS

## 2015-06-27 MED ORDER — ALENDRONATE SODIUM 70 MG PO TABS: 70.0000 mg | ORAL_TABLET | ORAL | Status: DC

## 2015-06-27 MED ORDER — DIPHENHYDRAMINE HCL 50 MG/ML IJ SOLN
25.0000 mg | Freq: Once | INTRAMUSCULAR | Status: AC
  Administered 2015-06-27: 25 mg via INTRAVENOUS
  Filled 2015-06-27: qty 1

## 2015-06-27 MED ORDER — INSULIN ASPART 100 UNIT/ML ~~LOC~~ SOLN
0.0000 [IU] | SUBCUTANEOUS | Status: DC
  Administered 2015-06-27: 7 [IU] via SUBCUTANEOUS

## 2015-06-27 MED ORDER — SERTRALINE HCL 100 MG PO TABS
200.0000 mg | ORAL_TABLET | Freq: Every day | ORAL | Status: DC
  Administered 2015-06-28: 200 mg via ORAL
  Filled 2015-06-27: qty 4
  Filled 2015-06-27: qty 2

## 2015-06-27 MED ORDER — ROSUVASTATIN CALCIUM 20 MG PO TABS
20.0000 mg | ORAL_TABLET | Freq: Every day | ORAL | Status: DC
  Administered 2015-06-27 – 2015-07-02 (×6): 20 mg via ORAL
  Filled 2015-06-27: qty 1
  Filled 2015-06-27 (×2): qty 2
  Filled 2015-06-27 (×4): qty 1

## 2015-06-27 MED ORDER — SODIUM CHLORIDE 0.9 % IV BOLUS (SEPSIS)
1000.0000 mL | Freq: Once | INTRAVENOUS | Status: AC
  Administered 2015-06-27: 1000 mL via INTRAVENOUS

## 2015-06-27 MED ORDER — PROTHROMBIN COMPLEX CONC HUMAN 500 UNITS IV KIT
2146.0000 [IU] | PACK | INTRAVENOUS | Status: AC
  Administered 2015-06-27: 2146 [IU] via INTRAVENOUS
  Filled 2015-06-27: qty 86

## 2015-06-27 MED ORDER — BUPROPION HCL 100 MG PO TABS
100.0000 mg | ORAL_TABLET | Freq: Two times a day (BID) | ORAL | Status: DC
  Administered 2015-06-27 – 2015-06-28 (×3): 100 mg via ORAL
  Filled 2015-06-27 (×4): qty 1

## 2015-06-27 MED ORDER — TIOTROPIUM BROMIDE MONOHYDRATE 18 MCG IN CAPS
18.0000 ug | ORAL_CAPSULE | Freq: Every day | RESPIRATORY_TRACT | Status: DC
  Administered 2015-06-28 – 2015-07-03 (×6): 18 ug via RESPIRATORY_TRACT
  Filled 2015-06-27 (×2): qty 5

## 2015-06-27 MED ORDER — VITAMIN K1 10 MG/ML IJ SOLN
10.0000 mg | INTRAVENOUS | Status: AC
  Administered 2015-06-27: 10 mg via INTRAVENOUS
  Filled 2015-06-27: qty 1

## 2015-06-27 MED ORDER — MAGNESIUM SULFATE 2 GM/50ML IV SOLN
2.0000 g | Freq: Once | INTRAVENOUS | Status: AC
  Administered 2015-06-27: 2 g via INTRAVENOUS
  Filled 2015-06-27: qty 50

## 2015-06-27 MED ORDER — SODIUM CHLORIDE 0.9 % IV SOLN
Freq: Once | INTRAVENOUS | Status: AC
  Administered 2015-06-27: 13:00:00 via INTRAVENOUS

## 2015-06-27 MED ORDER — NICOTINE 14 MG/24HR TD PT24
14.0000 mg | MEDICATED_PATCH | Freq: Every day | TRANSDERMAL | Status: DC
Start: 2015-06-27 — End: 2015-07-03
  Administered 2015-06-27 – 2015-07-03 (×7): 14 mg via TRANSDERMAL
  Filled 2015-06-27 (×7): qty 1

## 2015-06-27 MED ORDER — SODIUM CHLORIDE 0.9 % IV SOLN: INTRAVENOUS | Status: AC

## 2015-06-27 NOTE — Telephone Encounter (Signed)
Pt calls and states she feels worse and may not be able to wait til appt, advised her if she is feeling worse to please go to ED asap, call 911, states she is going to talk to her sister and see, again advised to ED asap

## 2015-06-27 NOTE — H&P (Signed)
Date: 06/27/2015               Patient Name:  [name] MRN: [id number]  DOB: 1950/08/13 Age / Sex: 65 y.o., female   PCP: [name], MD         Medical Service: Internal Medicine Teaching Service         Attending Physician: Dr. Sid Falcon, MD    First Contact: Dr. Jule Ser Pager: [phone]  Second Contact: Dr. Dellia Nims Pager: [phone]       After Hours (After 5p/  First Contact Pager: [phone]  weekends / holidays): Second Contact Pager: [phone]   Chief Complaint: anemia  History of Present Illness: [name] is a 65 y.o. woman with complicated PMHx of COPD, pulmonary HTN, CHF, anemia of chronic disease, GERD, adenomatous polyps, internal hemorrhoids, paroxysmal atrial fibrillation, Type II DM, clear cell renal cell carcinoma, aortic arthrosclerosis who presents due to "feeling anemic for 6 weeks".  She reports her symptoms include feeling exhausted, short of breath, and being able to "hear my heart beat in my head."  She reports that she has not been taking most of her home medications during this time due to lack of energy.  The only medication she regularly takes is her Coumadin and her anti-depressants.  She has also noticed during this time that she has developed spontaneous shaking episodes all over her body that will last 5-10 seconds in duration and then resolve.  During this time of symptoms she also reports melena and pica.  She also reports some mild epigastric tenderness, feeling lightheaded, nausea, and headache.  She denies any chest pain, syncope, diarrhea.  Patient also had admission in Nov 2016 for symptomatic iron deficiency anemia where she had an FOBT that was negative, ferritin low at 8. During last admission, she did not want to be seen by GI either as inpatient or outpatient because she believes GI workup in the past has not explained her anemia.  EGD from 05/2011 was normal and colonoscopy at the time showing internal hemorrhoids.     On presentation to the ED, she complained of SOB and found to have a hemoglobin of 3.8.  Rectal exam revealed dark stool but no bright red blood.  Her INR was supratherapeutic at 3.8 and was planned to be reversed with vitamin K and Kcentra.  She was ordered 3 units of PRBCs.  Her FOBT was positive  Meds: Current Facility-Administered Medications  Medication Dose Route Frequency Provider Last Rate Last Dose  . 0.9 %  sodium chloride infusion   Intravenous Continuous Tasrif Ahmed, MD 100 mL/hr at 06/27/15 1405    . albuterol (PROVENTIL) (2.5 MG/3ML) 0.083% nebulizer solution 3 mL  3 mL Inhalation Q6H PRN Tasrif Ahmed, MD      . alendronate (FOSAMAX) tablet 70 mg  70 mg Oral Weekly Tasrif Ahmed, MD   70 mg at 06/27/15 1405  . ALPRAZolam (XANAX) tablet 0.5-1 mg  0.5-1 mg Oral BID PRN Dellia Nims, MD   1 mg at 06/27/15 1531  . buPROPion [facility]) tablet 100 mg  100 mg Oral BID Tasrif Ahmed, MD   100 mg at 06/27/15 1406  . fluticasone (FLONASE) 50 MCG/ACT nasal spray 2 spray  2 spray Each Nare Daily Tasrif Ahmed, MD   2 spray at 06/27/15 1440  . HYDROcodone-acetaminophen (NORCO/VICODIN) 5-325 MG per tablet 1-2 tablet  1-2 tablet Oral Q6H PRN Tasrif Ahmed, MD      . insulin aspart (  novoLOG) injection 0-9 Units  0-9 Units Subcutaneous Q4H Tasrif Ahmed, MD   7 Units at 06/27/15 1456  . insulin detemir (LEVEMIR) injection 20 Units  20 Units Subcutaneous BID Dellia Nims, MD   20 Units at 06/27/15 1443  . mometasone-formoterol (DULERA) 200-5 MCG/ACT inhaler 2 puff  2 puff Inhalation BID Tasrif Ahmed, MD      . nicotine (NICODERM CQ - dosed in mg/24 hours) patch 14 mg  14 mg Transdermal Daily Tasrif Ahmed, MD   14 mg at 06/27/15 1531  . ondansetron (ZOFRAN) 8 mg in sodium chloride 0.9 % 50 mL IVPB  8 mg Intravenous Q6H PRN Tasrif Ahmed, MD      . pantoprazole (PROTONIX) injection 40 mg  40 mg Intravenous BID Tasrif Ahmed, MD   40 mg at 06/27/15 1443  . rosuvastatin (CRESTOR) tablet 20 mg  20 mg Oral  QHS Tasrif Ahmed, MD      . sertraline (ZOLOFT) tablet 200 mg  200 mg Oral Daily Tasrif Ahmed, MD   200 mg at 06/27/15 1406  . tiotropium (SPIRIVA) inhalation capsule 18 mcg  18 mcg Inhalation Daily Dellia Nims, MD        Allergies: Allergies as of 06/27/2015 - Review Complete 06/27/2015  Allergen Reaction Noted  . Morphine and related Other (See Comments) 06/27/2013  . Oxycontin [oxycodone] Other (See Comments) 05/03/2012  . Tramadol hcl    . Lorazepam Other (See Comments) 11/04/2010   Past Medical History  Diagnosis Date  . COPD (chronic obstructive pulmonary disease) with emphysema (HCC)     PFTs 02/2012: FEV1 0.92 (40%), ratio 69, 27% increase in FEV1 with BD, TLC 91%, severe airtrapping, DLCO49% On chronic home O2. Pulmonary rehab referral 05/2012   . Moderate to severe pulmonary hypertension ([place])     2014 TEE w PA peak pressure 46 mmHg, s/p MV replacement   . Tobacco abuse 07/28/2012  . Hyperlipidemia LDL goal < 100 11/20/2005  . Carotid artery stenosis     s/p right endarterectomy (06/2010) Carotid US (07/2010):  Left: Moderate-to-severe (60-79%) calcific and non-calcific plaque origin and proximal ICA and ECA   . Mitral stenosis     s/p Mitral valve replacement with a 27-mm pericardial porcine valve (Medtronic Mosaic valve, serial #[id number] on 09/20/10, Dr. Prescott Gum)   . Chronic congestive heart failure with left ventricular diastolic dysfunction ([place]) 10/21/2010  . Depression 11/19/2005  . Anxiety 07/24/2010  . Fibromyalgia 08/29/2010  . Obesity (BMI 30.0-34.9) 10/23/2011  . Adenomatous polyps 05/14/2011    Colonoscopy (05/2011): 4 mm adenomatous polyp excised endoscopically Colonoscopy (02/2002): Adenomatous polyp excised endoscopically   . Gastroesophageal reflux disease   . Internal hemorrhoids 08/04/2012  . Osteoporosis     DEXA (12/09/2011): L-spine T -3.7, left hip T -1.4 DEXA (12/2004): L-spine T -2.6, left hip -0.1   . Allergic rhinitis 06/01/2012  . Bilateral cataracts  08/04/2012    Visually insignificant   . Chronic constipation 02/03/2011  . Chronic venous insufficiency 08/04/2012  . Paroxysmal atrial fibrillation ([place]) 10/22/2010    s/p Left atrial maze procedure for paroxysmal atrial fibrillation on 09/20/2010 by Dr Prescott Gum.  Subsequent splenic infarct, decision was made to re-anticoagulate with coumadin, likely life-long as this is the most likely cause of the splenic infarct.   . Chronic low back pain 10/06/2012  . Anemia of chronic disease 01/01/2013  . Obstructive sleep apnea     Nocturnal polysomnography (06/2009): Moderate sleep apnea/ hypopnea syndrome , AHI 17.8 per hour with nonpositional  hypopneas. CPAP titration to 12 CWP, AHI 2.4 per hour. On nocturnal CPAP via a small resMed Quattro full-face mask with heated humidifier.   . Type 2 diabetes mellitus with diabetic neuropathy ([place])   . History of blood transfusion     "several times"   . Clear cell renal cell carcinoma ([place]) 07/21/2011    s/p cryoablation of left RCC in 09/2011 by [name]. Followed by [name]  [facility]) .    Marland Kitchen Chronic daily headache 01/16/2014  . Right nephrolithiasis 09/06/2014    5 mm non-obstructing calculus seen on CT scan 09/05/2014   . Aortic atherosclerosis ([place]) 10/19/2014    Seen on CT scan, currently asymptomatic  . Shortness of breath dyspnea    Past Surgical History  Procedure Laterality Date  . Orif clavicle fracture  01/2004    by Thana Farr. [name], M.D for Right clavicle nonunion.  . Tonsillectomy    . Carotid endarterectomy Right 07/04/2010    by [name] for asymptomatic right carotid artery stenosis  . Lipoma excision  08/2005    occipital lipoma 1.5cm - by Dr. [name]  . Hysteroscopy with endometrial ablation  06/2001    for persistent post-menopausal bleeding // by S. [name], M.D.  . Mitral valve replacement  09/20/10     with a 27-mm pericardial porcine valve (Medtronic Mosaic valve, serial #[id number]). 09/20/10, Dr Prescott Gum  .  Maze Left 09/20/10    for paroxysmal atrial fibrillation (Dr. Prescott Gum)  . Chest tube insertion  09/24/2010    Dr Prescott Gum  . Cardiac valve replacement  Aug. 2012    "mitral valve"  . Colonoscopy  05/12/2011    performed by Dr. Michail Sermon. Showing small internal hemorrhoids, single tubular adenoma polyp  . Esophagogastroduodenoscopy  05/12/2011    performed by Dr. Michail Sermon. Negative for ulcerations, biopsy negative for evidence of celiac sprue  . Tubal ligation    . Fracture surgery Right 2005    clavicle  . Dilation and curettage of uterus    . Cryoablation Left 09/2011    by [name]. Followed by [name]  [facility]) .     Family History  Problem Relation Age of Onset  . Peptic Ulcer Disease Father   . Heart attack Father 61    Died of MI at age 37  . Heart attack Brother 74    Died of MI at age 78  . Obesity Brother   . Pneumonia Mother   . Healthy Sister   . Lupus Daughter   . Obsessive Compulsive Disorder Daughter    Social History   Social History  . Marital Status: Divorced    Spouse Name: N/A  . Number of Children: N/A  . Years of Education: N/A   Occupational History  . Not on file.   Social History Main Topics  . Smoking status: Current Every Day Smoker -- 1.50 packs/day for 49 years    Types: Cigarettes    Start date: 10/31/2013  . Smokeless tobacco: Never Used     Comment: 1 1/2  PPD down to 1 ppd  . Alcohol Use: No  . Drug Use: No  . Sexual Activity: No   Other Topics Concern  . Not on file   Social History Narrative   Lives alone in [place]) with 60 pound chow   Worked at [employer] for 18 years   No car    Review of Systems: Pertinent items are noted  in HPI.  Physical Exam: Blood pressure 112/38, pulse 94, temperature 98.7 F (37.1 C), temperature source Oral, resp. rate 18, height 5' 1.81" (1.57 m), weight 187 lb 6.3 oz (85 kg), SpO2 100 %. General: resting in bed, fatigued appearing, no  distress HEENT: conjunctival pallor, EOMI Cardiac: distant heart sounds, RRR Pulm: normal effort, decreased at the bases, mild expiratory wheezes Abd: soft, nontender, nondistended, BS present Ext: warm and well perfused, 1+ pedal edema Neuro: alert and oriented X3, cranial nerves II-XII grossly intact   Lab results: Basic Metabolic Panel:  Recent Labs  06/27/15 1139  NA 136  K 4.4  CL 106  CO2 21*  GLUCOSE 355*  BUN 13  CREATININE 0.94  CALCIUM 8.5*   CBC:  Recent Labs  06/27/15 1139  WBC 9.0  NEUTROABS 6.9  HGB 3.6*  HCT 13.5*  MCV 68.2*  PLT 329   Cardiac Enzymes:  Recent Labs  06/27/15 1429  TROPONINI <0.03   CBG:  Recent Labs  06/27/15 1448  GLUCAP 333*   Coagulation:  Recent Labs  06/27/15 1139  LABPROT 36.2*  INR 3.76*   Urine Drug Screen: Drugs of Abuse     Component Value Date/Time   LABOPIA POSITIVE* 01/07/2013 2114   COCAINSCRNUR NONE DETECTED 01/07/2013 2114   LABBENZ NONE DETECTED 01/07/2013 2114   AMPHETMU NONE DETECTED 01/07/2013 2114   THCU NONE DETECTED 01/07/2013 2114   LABBARB NONE DETECTED 01/07/2013 2114      Imaging results:  Dg Chest Port 1 View  06/27/2015  CLINICAL DATA:  Shortness of breath for 6 weeks. Cough for 3 days. Intermittent chest pain. EXAM: PORTABLE CHEST 1 VIEW COMPARISON:  10/25/2014 FINDINGS: Mild cardiomegaly stable. Both lungs are clear. No evidence of pneumothorax or pleural effusion. Prior median sternotomy noted. Fixation plate and screws noted in the right clavicle. Old right posterior rib fracture deformities also noted. IMPRESSION: Stable mild cardiomegaly.  No active lung disease. Electronically Signed   By: Earle Gell M.D.   On: 06/27/2015 12:11    Other results: EKG: Sinus rhythm, prolonged PR interval, right axis deviation  Assessment & Plan by Problem: Active Problems:   Acute GI bleeding  Symptomatic Anemia/GI Bleed: she has had a negative endoscopy and colonoscopy in 2013 and  2007 and 1 negative capsule endoscopy along with multiple nondiagnostic CT scans in the past.  Previous admission in Nov 2016 for symptomatic IDA where she was transfused 2 units and received Feraheme.  During that admission, she refused GI consultation.  Since that time, she has not been taking anything for her iron deficiency and has not had her hemoglobin rechecked.  She is having some melena but otherwise has not noticed any bleeding.  She does not use chronic NSAIDs.  BUN and creatine her normal, INR supratherapeutic - GI consult appreciated, will on repeat GI evaluation either as inpatient or outpatient - transfuse 3 units - CBC Q6H, repeat transfusion as needed to keep > 7 - follow PT/INR - hemolysis labs - PPI - trend troponins - telemetry - maintain IV access  CHF: Echo from 08/2012 showing LVEF 55-60%. Lungs sounds are okay but does have some wheezes.  She has been non-adherent to her Lasix and has some pitting edema of her lower extremities.  She is also on potassium supplementation at home which we will hold for the time being. -Holding Lasix and K+ for now  -daily weights -strict I/Os - watch electrolytes  DMII - Levemir 20units BID - SSI- sensitive  Anxiety -continue Alprazolam 0.5-1 mg BID prn  Chronic low back pain  -continue Norco 5-325 1-2 tabs q6h prn  COPD: currently stable with no issues. She is on via [place]. Patient reports being on home oxygen. -continue Albuterol prn -continue Dulera BID -continue Spiriva daily   Depression -continue Buproprion 100 mg BID -continue Sertraline 200 mg daily  Tobacco use: currently smokes about 1 PPD -Nicotine patch   HLD -continue Crestor 20 mg QHS  GERD -Protonix 40 mg IV Q12H  DVT ppx: SCDs  Diet: clears  Code: FULL  Dispo: Disposition is deferred at this time, awaiting improvement of current medical problems. Anticipated discharge in approximately 2-3 day(s).   The patient does have a current PCP [name], MD) and does need an [facility] hospital follow-up appointment after discharge.  The patient does not have transportation limitations that hinder transportation to clinic appointments.  Signed: Jule Ser, DO 06/27/2015, 5:23 PM

## 2015-06-27 NOTE — Consult Note (Signed)
Reason for Consult: Anemia guaiac positivity Referring Physician: Hospital team  [name] is an 65 y.o. female.  HPI: Patient into my partner [name] and her hospital computer chart and our office computer chart was reviewed and other than occasional black stools which have been irregular because she has not been eating well she has not seen any obvious blood and she has not had a hemoglobin since winter and she's been chronically anemic for years but she's been off her iron for some time and does not take any aspirin or nonsteroidals and does have some increased reflux and heartburn but no GI problems run in her family and she had a negative endoscopy and colonoscopy in 2013 and 2007 and 1 negative capsule endoscopy and multiple nondiagnostic CAT scans and currently she is feeling a little better with her transfusion and otherwise has no other complaints other than her chronic shortness of breath  Past Medical History  Diagnosis Date  . COPD (chronic obstructive pulmonary disease) with emphysema (HCC)     PFTs 02/2012: FEV1 0.92 (40%), ratio 69, 27% increase in FEV1 with BD, TLC 91%, severe airtrapping, DLCO49% On chronic home O2. Pulmonary rehab referral 05/2012   . Moderate to severe pulmonary hypertension ([place])     2014 TEE w PA peak pressure 46 mmHg, s/p MV replacement   . Tobacco abuse 07/28/2012  . Hyperlipidemia LDL goal < 100 11/20/2005  . Carotid artery stenosis     s/p right endarterectomy (06/2010) Carotid US (07/2010):  Left: Moderate-to-severe (60-79%) calcific and non-calcific plaque origin and proximal ICA and ECA   . Mitral stenosis     s/p Mitral valve replacement with a 27-mm pericardial porcine valve (Medtronic Mosaic valve, serial #[id number] on 09/20/10, Dr. Prescott Gum)   . Chronic congestive heart failure with left ventricular diastolic dysfunction ([place]) 10/21/2010  . Depression 11/19/2005  . Anxiety 07/24/2010  . Fibromyalgia 08/29/2010  . Obesity (BMI 30.0-34.9)  10/23/2011  . Adenomatous polyps 05/14/2011    Colonoscopy (05/2011): 4 mm adenomatous polyp excised endoscopically Colonoscopy (02/2002): Adenomatous polyp excised endoscopically   . Gastroesophageal reflux disease   . Internal hemorrhoids 08/04/2012  . Osteoporosis     DEXA (12/09/2011): L-spine T -3.7, left hip T -1.4 DEXA (12/2004): L-spine T -2.6, left hip -0.1   . Allergic rhinitis 06/01/2012  . Bilateral cataracts 08/04/2012    Visually insignificant   . Chronic constipation 02/03/2011  . Chronic venous insufficiency 08/04/2012  . Paroxysmal atrial fibrillation ([place]) 10/22/2010    s/p Left atrial maze procedure for paroxysmal atrial fibrillation on 09/20/2010 by Dr Prescott Gum.  Subsequent splenic infarct, decision was made to re-anticoagulate with coumadin, likely life-long as this is the most likely cause of the splenic infarct.   . Chronic low back pain 10/06/2012  . Anemia of chronic disease 01/01/2013  . Obstructive sleep apnea     Nocturnal polysomnography (06/2009): Moderate sleep apnea/ hypopnea syndrome , AHI 17.8 per hour with nonpositional hypopneas. CPAP titration to 12 CWP, AHI 2.4 per hour. On nocturnal CPAP via a small resMed Quattro full-face mask with heated humidifier.   . Type 2 diabetes mellitus with diabetic neuropathy ([place])   . History of blood transfusion     "several times"   . Clear cell renal cell carcinoma ([place]) 07/21/2011    s/p cryoablation of left RCC in 09/2011 by [name]. Followed by [name]  [facility]) .    Marland Kitchen Chronic daily headache 01/16/2014  . Right nephrolithiasis 09/06/2014  5 mm non-obstructing calculus seen on CT scan 09/05/2014   . Aortic atherosclerosis ([place]) 10/19/2014    Seen on CT scan, currently asymptomatic  . Shortness of breath dyspnea     Past Surgical History  Procedure Laterality Date  . Orif clavicle fracture  01/2004    by Thana Farr. [name], M.D for Right clavicle nonunion.  . Tonsillectomy    . Carotid endarterectomy Right  07/04/2010    by [name] for asymptomatic right carotid artery stenosis  . Lipoma excision  08/2005    occipital lipoma 1.5cm - by Dr. [name]  . Hysteroscopy with endometrial ablation  06/2001    for persistent post-menopausal bleeding // by S. [name], M.D.  . Mitral valve replacement  09/20/10     with a 27-mm pericardial porcine valve (Medtronic Mosaic valve, serial #[id number]). 09/20/10, Dr Prescott Gum  . Maze Left 09/20/10    for paroxysmal atrial fibrillation (Dr. Prescott Gum)  . Chest tube insertion  09/24/2010    Dr Prescott Gum  . Cardiac valve replacement  Aug. 2012    "mitral valve"  . Colonoscopy  05/12/2011    performed by Dr. Michail Sermon. Showing small internal hemorrhoids, single tubular adenoma polyp  . Esophagogastroduodenoscopy  05/12/2011    performed by Dr. Michail Sermon. Negative for ulcerations, biopsy negative for evidence of celiac sprue  . Tubal ligation    . Fracture surgery Right 2005    clavicle  . Dilation and curettage of uterus    . Cryoablation Left 09/2011    by [name]. Followed by [name]  [facility]) .      Family History  Problem Relation Age of Onset  . Peptic Ulcer Disease Father   . Heart attack Father 28    Died of MI at age 65  . Heart attack Brother 85    Died of MI at age 28  . Obesity Brother   . Pneumonia Mother   . Healthy Sister   . Lupus Daughter   . Obsessive Compulsive Disorder Daughter     Social History:  reports that she has been smoking Cigarettes.  She started smoking about 19 months ago. She has a 73.5 pack-year smoking history. She has never used smokeless tobacco. She reports that she does not drink alcohol or use illicit drugs.  Allergies:  Allergies  Allergen Reactions  . Morphine And Related Other (See Comments)  . Oxycontin [Oxycodone] Other (See Comments)    headache  . Tramadol Hcl     REACTION: swelling  . Lorazepam Other (See Comments)    Patient's sister noted that ativan caused the patient to  become extremely confused during hospitalization 09/2010; tolerates Xanax    Medications: I have reviewed the patient's current medications.  Results for orders placed or performed during the hospital encounter of 06/27/15 (from the past 48 hour(s))  CBC with Differential     Status: Abnormal   Collection Time: 06/27/15 11:39 AM  Result Value Ref Range   WBC 9.0 4.0 - 10.5 K/uL   RBC 1.98 (L) 3.87 - 5.11 MIL/uL   Hemoglobin 3.6 (LL) 12.0 - 15.0 g/dL    Comment: REPEATED TO VERIFY CRITICAL RESULT CALLED TO, READ BACK BY AND VERIFIED WITH: CLARK ROWE,RN AT 1213 06/27/15 BY ZBEECH.    HCT 13.5 (L) 36.0 - 46.0 %   MCV 68.2 (L) 78.0 - 100.0 fL   MCH 18.2 (L) 26.0 - 34.0 pg   MCHC 26.7 (L) 30.0 - 36.0 g/dL  RDW 20.0 (H) 11.5 - 15.5 %   Platelets 329 150 - 400 K/uL   Neutrophils Relative % 77 %   Lymphocytes Relative 10 %   Monocytes Relative 13 %   Eosinophils Relative 0 %   Basophils Relative 0 %   Neutro Abs 6.9 1.7 - 7.7 K/uL   Lymphs Abs 0.9 0.7 - 4.0 K/uL   Monocytes Absolute 1.2 (H) 0.1 - 1.0 K/uL   Eosinophils Absolute 0.0 0.0 - 0.7 K/uL   Basophils Absolute 0.0 0.0 - 0.1 K/uL   RBC Morphology RARE NRBCs     Comment: POLYCHROMASIA PRESENT SPHEROCYTES   Basic metabolic panel     Status: Abnormal   Collection Time: 06/27/15 11:39 AM  Result Value Ref Range   Sodium 136 135 - 145 mmol/L   Potassium 4.4 3.5 - 5.1 mmol/L   Chloride 106 101 - 111 mmol/L   CO2 21 (L) 22 - 32 mmol/L   Glucose, Bld 355 (H) 65 - 99 mg/dL   BUN 13 6 - 20 mg/dL   Creatinine, Ser 0.94 0.44 - 1.00 mg/dL   Calcium 8.5 (L) 8.9 - 10.3 mg/dL   GFR calc non Af Amer >60 >60 mL/min   GFR calc Af Amer >60 >60 mL/min    Comment: (NOTE) The eGFR has been calculated using the CKD EPI equation. This calculation has not been validated in all clinical situations. eGFR's persistently <60 mL/min signify possible Chronic Kidney Disease.    Anion gap 9 5 - 15  Brain natriuretic peptide     Status: Abnormal    Collection Time: 06/27/15 11:39 AM  Result Value Ref Range   B Natriuretic Peptide 383.9 (H) 0.0 - 100.0 pg/mL  Protime-INR     Status: Abnormal   Collection Time: 06/27/15 11:39 AM  Result Value Ref Range   Prothrombin Time 36.2 (H) 11.6 - 15.2 seconds   INR 3.76 (H) 0.00 - 1.49  I-stat troponin, ED     Status: None   Collection Time: 06/27/15 11:51 AM  Result Value Ref Range   Troponin i, poc 0.01 0.00 - 0.08 ng/mL   Comment 3            Comment: Due to the release kinetics of cTnI, a negative result within the first hours of the onset of symptoms does not rule out myocardial infarction with certainty. If myocardial infarction is still suspected, repeat the test at appropriate intervals.   Prepare RBC     Status: None   Collection Time: 06/27/15 12:13 PM  Result Value Ref Range   Order Confirmation ORDER PROCESSED BY BLOOD BANK   Type and screen     Status: None (Preliminary result)   Collection Time: 06/27/15 12:18 PM  Result Value Ref Range   ABO/RH(D) A NEG    Antibody Screen NEG    Sample Expiration 06/30/2015    Unit Number [id number]    Blood Component Type RED CELLS,LR    Unit division 00    Status of Unit ISSUED    Transfusion Status OK TO TRANSFUSE    Crossmatch Result Compatible    Unit Number [id number]    Blood Component Type RED CELLS,LR    Unit division 00    Status of Unit ALLOCATED    Transfusion Status OK TO TRANSFUSE    Crossmatch Result Compatible    Unit Number [id number]    Blood Component Type RED CELLS,LR    Unit division 00    Status of Unit  ALLOCATED    Transfusion Status OK TO TRANSFUSE    Crossmatch Result Compatible   POC occult blood, ED     Status: Abnormal   Collection Time: 06/27/15 12:48 PM  Result Value Ref Range   Fecal Occult Bld POSITIVE (A) NEGATIVE  Troponin I (q 6hr x 3)     Status: None   Collection Time: 06/27/15  2:29 PM  Result Value Ref Range   Troponin I <0.03 <0.031 ng/mL    Comment:        NO  INDICATION OF MYOCARDIAL INJURY.   Glucose, capillary     Status: Abnormal   Collection Time: 06/27/15  2:48 PM  Result Value Ref Range   Glucose-Capillary 333 (H) 65 - 99 mg/dL   *Note: Due to a large number of results and/or encounters for the requested time period, some results have not been displayed. A complete set of results can be found in Results Review.    Dg Chest Port 1 View  06/27/2015  CLINICAL DATA:  Shortness of breath for 6 weeks. Cough for 3 days. Intermittent chest pain. EXAM: PORTABLE CHEST 1 VIEW COMPARISON:  10/25/2014 FINDINGS: Mild cardiomegaly stable. Both lungs are clear. No evidence of pneumothorax or pleural effusion. Prior median sternotomy noted. Fixation plate and screws noted in the right clavicle. Old right posterior rib fracture deformities also noted. IMPRESSION: Stable mild cardiomegaly.  No active lung disease. Electronically Signed   By: Earle Gell M.D.   On: 06/27/2015 12:11    ROS negative except above Blood pressure 112/38, pulse 94, temperature 98.7 F (37.1 C), temperature source Oral, resp. rate 18, height 5' 1.81" (1.57 m), weight 85 kg (187 lb 6.3 oz), SpO2 100 %. Physical Exam sleepy but in no acute distress bilateral expiratory wheezes decreased heart sounds abdomen is soft nontender good bowel sounds labs reviewed normal BUN and creatinine increased PT on Coumadin hemoglobin 3  Assessment/Plan: Worsening anemia in a patient with chronic anemia on Coumadin and negative GI evaluations in the past Plan: Will plan on repeat GI evaluation either as inpatient or outpatient and will decide tomorrow based on how she is doing how to begin and please call us sooner if signs of active bleeding otherwise we will allow clear liquids  Uzziah Rigg E 06/27/2015, 4:13 PM

## 2015-06-27 NOTE — Plan of Care (Signed)
Problem: Education: Goal: Knowledge of [facility] Education information/materials will improve Outcome: Progressing Patient request consults from spiritual and social work Biochemist, clinical
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
## 2015-06-27 NOTE — ED Provider Notes (Signed)
CSN: [id number]     Arrival date & time 06/27/15  1151 History   First MD Initiated Contact with Patient 06/27/15 1145     Chief Complaint  Patient presents with  . Shortness of Breath     (Consider location/radiation/quality/duration/timing/severity/associated sxs/prior Treatment) Patient is a 65 y.o. female presenting with shortness of breath. The history is provided by the patient.  Shortness of Breath Severity:  Moderate Onset quality:  Sudden Duration:  2 weeks Timing:  Constant Progression:  Worsening Chronicity:  Chronic Relieved by:  Nothing Worsened by:  Exertion Ineffective treatments:  None tried Associated symptoms: no chest pain, no fever, no headaches, no vomiting and no wheezing     65 yo F With a chief complaint of shortness of breath. Going on for quite some time for getting worse over the past week or so. Patient has a history of anemia has had many transfusions in the past. Denies blood in her stool or dark stool. Denies cough congestion fevers. Feels like her symptoms are caused by her anemia.  Past Medical History  Diagnosis Date  . COPD (chronic obstructive pulmonary disease) with emphysema (HCC)     PFTs 02/2012: FEV1 0.92 (40%), ratio 69, 27% increase in FEV1 with BD, TLC 91%, severe airtrapping, DLCO49% On chronic home O2. Pulmonary rehab referral 05/2012   . Moderate to severe pulmonary hypertension ([place])     2014 TEE w PA peak pressure 46 mmHg, s/p MV replacement   . Tobacco abuse 07/28/2012  . Hyperlipidemia LDL goal < 100 11/20/2005  . Carotid artery stenosis     s/p right endarterectomy (06/2010) Carotid US (07/2010):  Left: Moderate-to-severe (60-79%) calcific and non-calcific plaque origin and proximal ICA and ECA   . Mitral stenosis     s/p Mitral valve replacement with a 27-mm pericardial porcine valve (Medtronic Mosaic valve, serial #[id number] on 09/20/10, Dr. Prescott Gum)   . Chronic congestive heart failure with left ventricular diastolic  dysfunction ([place]) 10/21/2010  . Depression 11/19/2005  . Anxiety 07/24/2010  . Fibromyalgia 08/29/2010  . Obesity (BMI 30.0-34.9) 10/23/2011  . Adenomatous polyps 05/14/2011    Colonoscopy (05/2011): 4 mm adenomatous polyp excised endoscopically Colonoscopy (02/2002): Adenomatous polyp excised endoscopically   . Gastroesophageal reflux disease   . Internal hemorrhoids 08/04/2012  . Osteoporosis     DEXA (12/09/2011): L-spine T -3.7, left hip T -1.4 DEXA (12/2004): L-spine T -2.6, left hip -0.1   . Allergic rhinitis 06/01/2012  . Bilateral cataracts 08/04/2012    Visually insignificant   . Chronic constipation 02/03/2011  . Chronic venous insufficiency 08/04/2012  . Paroxysmal atrial fibrillation ([place]) 10/22/2010    s/p Left atrial maze procedure for paroxysmal atrial fibrillation on 09/20/2010 by Dr Prescott Gum.  Subsequent splenic infarct, decision was made to re-anticoagulate with coumadin, likely life-long as this is the most likely cause of the splenic infarct.   . Chronic low back pain 10/06/2012  . Anemia of chronic disease 01/01/2013  . Obstructive sleep apnea     Nocturnal polysomnography (06/2009): Moderate sleep apnea/ hypopnea syndrome , AHI 17.8 per hour with nonpositional hypopneas. CPAP titration to 12 CWP, AHI 2.4 per hour. On nocturnal CPAP via a small resMed Quattro full-face mask with heated humidifier.   . Type 2 diabetes mellitus with diabetic neuropathy ([place])   . History of blood transfusion     "several times"   . Clear cell renal cell carcinoma ([place]) 07/21/2011    s/p cryoablation of left RCC in 09/2011 by Dr.  Kathlene Cote. Followed by [name]  [facility]) .    Marland Kitchen Chronic daily headache 01/16/2014  . Right nephrolithiasis 09/06/2014    5 mm non-obstructing calculus seen on CT scan 09/05/2014   . Aortic atherosclerosis ([place]) 10/19/2014    Seen on CT scan, currently asymptomatic  . Shortness of breath dyspnea    Past Surgical History  Procedure Laterality Date  . Orif  clavicle fracture  01/2004    by Thana Farr. [name], M.D for Right clavicle nonunion.  . Tonsillectomy    . Carotid endarterectomy Right 07/04/2010    by [name] for asymptomatic right carotid artery stenosis  . Lipoma excision  08/2005    occipital lipoma 1.5cm - by Dr. [name]  . Hysteroscopy with endometrial ablation  06/2001    for persistent post-menopausal bleeding // by S. [name], M.D.  . Mitral valve replacement  09/20/10     with a 27-mm pericardial porcine valve (Medtronic Mosaic valve, serial #[id number]). 09/20/10, Dr Prescott Gum  . Maze Left 09/20/10    for paroxysmal atrial fibrillation (Dr. Prescott Gum)  . Chest tube insertion  09/24/2010    Dr Prescott Gum  . Cardiac valve replacement  Aug. 2012    "mitral valve"  . Colonoscopy  05/12/2011    performed by Dr. Michail Sermon. Showing small internal hemorrhoids, single tubular adenoma polyp  . Esophagogastroduodenoscopy  05/12/2011    performed by Dr. Michail Sermon. Negative for ulcerations, biopsy negative for evidence of celiac sprue  . Tubal ligation    . Fracture surgery Right 2005    clavicle  . Dilation and curettage of uterus    . Cryoablation Left 09/2011    by [name]. Followed by [name]  [facility]) .     Family History  Problem Relation Age of Onset  . Peptic Ulcer Disease Father   . Heart attack Father 65    Died of MI at age 70  . Heart attack Brother 55    Died of MI at age 15  . Obesity Brother   . Pneumonia Mother   . Healthy Sister   . Lupus Daughter   . Obsessive Compulsive Disorder Daughter    Social History  Substance Use Topics  . Smoking status: Current Every Day Smoker -- 1.50 packs/day for 49 years    Types: Cigarettes    Start date: 10/31/2013  . Smokeless tobacco: Never Used     Comment: 1 1/2  PPD down to 1 ppd  . Alcohol Use: No   OB History    No data available     Review of Systems  Constitutional: Negative for fever and chills.  HENT: Negative for congestion and  rhinorrhea.   Eyes: Negative for redness and visual disturbance.  Respiratory: Positive for shortness of breath. Negative for wheezing.   Cardiovascular: Negative for chest pain and palpitations.  Gastrointestinal: Negative for nausea and vomiting.  Genitourinary: Negative for dysuria and urgency.  Musculoskeletal: Negative for myalgias and arthralgias.  Skin: Negative for pallor and wound.  Neurological: Negative for dizziness and headaches.      Allergies  Morphine and related; Oxycontin; Tramadol hcl; and Lorazepam  Home Medications   Prior to Admission medications   Medication Sig Start Date End Date Taking? Authorizing Provider  albuterol (PROAIR HFA) 108 (90 BASE) MCG/ACT inhaler Inhale 1-2 puffs into the lungs every 6 (six) hours as needed for wheezing or shortness of breath. 12/13/14  Yes [name], MD  ALPRAZolam [name]) 1 MG  tablet Take 0.5-1 tablets (0.5-1 mg total) by mouth 2 (two) times daily as needed for anxiety. 03/09/15  Yes [name], MD  Blood Glucose Monitoring Suppl (ONETOUCH VERIO) W/DEVICE KIT 1 each by Does not apply route 4 (four) times daily. 07/25/14  Yes [name], MD  EASY TOUCH PEN NEEDLES 31G X 5 MM MISC USE TO INJECT INSULIN 5 TIMES DAILY 08/23/14  Yes [name], MD  Fluticasone-Salmeterol (ADVAIR DISKUS) 250-50 MCG/DOSE AEPB Inhale 1 puff into the lungs 2 (two) times daily. 05/03/15  Yes [name], MD  HYDROcodone-acetaminophen (NORCO/VICODIN) 5-325 MG tablet Take 1-2 tablets by mouth every 6 (six) hours as needed for severe pain. 04/06/15  Yes [name], MD  omeprazole (PRILOSEC) 40 MG capsule Take 1 capsule (40 mg total) by mouth 2 (two) times daily. 10/30/14  Yes [name], MD  rosuvastatin (CRESTOR) 20 MG tablet Take 1 tablet (20 mg total) by mouth at bedtime. 10/12/14  Yes [name], MD  sertraline (ZOLOFT) 100 MG tablet Take 2 tablets (200 mg total) by mouth daily. 10/12/14  Yes [name], MD  tiotropium (SPIRIVA  HANDIHALER) 18 MCG inhalation capsule Place 1 capsule (18 mcg total) into inhaler and inhale daily. 10/12/14  Yes [name], MD  warfarin (COUMADIN) 5 MG tablet TAKE 1/2 TABLET BY MOUTH EVERY DAY AT 6PM ** EXCEPT ON MONDAYS TAKE 1 TABLET (5 MG) 05/03/15  Yes [name], MD  alendronate (FOSAMAX) 70 MG tablet Take 1 tablet (70 mg total) by mouth once a week. Patient not taking: Reported on 06/27/2015 03/09/15   [name], MD  buPROPion [facility]) 100 MG tablet Take 1 tablet (100 mg total) by mouth 2 (two) times daily. Patient not taking: Reported on 06/27/2015 10/12/14   [name], MD  chlorpheniramine (CHLOR-TRIMETON) 4 MG tablet Take 1 tablet (4 mg total) by mouth 3 (three) times daily as needed for allergies. Patient not taking: Reported on 06/27/2015 10/06/14   [name], MD  diclofenac sodium (VOLTAREN) 1 % GEL Apply 2 g topically 4 (four) times daily as needed. Patient not taking: Reported on 06/27/2015 02/08/15   [name], MD  fluticasone [facility]) 50 MCG/ACT nasal spray Place 2 sprays into both nostrils daily. Patient not taking: Reported on 06/27/2015 02/08/15   [name], MD  furosemide (LASIX) 80 MG tablet Take 1 tablet (80 mg total) by mouth daily. Patient not taking: Reported on 06/27/2015 12/27/14   Shela Leff, MD  gabapentin (NEURONTIN) 300 MG capsule Take 1 capsule (300 mg total) by mouth 3 (three) times daily. Patient not taking: Reported on 06/27/2015 10/06/14   [name], MD  insulin aspart (NOVOLOG FLEXPEN) 100 UNIT/ML FlexPen Inject 18 Units into the skin 3 (three) times daily with meals. Patient not taking: Reported on 06/27/2015 01/10/15   [name], MD  Insulin Detemir (LEVEMIR FLEXTOUCH) 100 UNIT/ML Pen Inject 45 Units into the skin 2 (two) times daily. Patient not taking: Reported on 06/27/2015 03/09/15   [name], MD  Lancets Misc. (ACCU-CHEK FASTCLIX LANCET) KIT Check your blood 4 times a day dx code 250.00 insulin  requiring Patient not taking: Reported on 06/27/2015 01/19/13   [name], MD  lidocaine (XYLOCAINE) 2 % jelly APPLY TO AFFECTED AREA THREE TIMES A DAY Patient not taking: Reported on 06/27/2015 10/18/14   [name], MD  nystatin cream (MYCOSTATIN) Apply topically 2 (two) times daily. Patient not taking: Reported on 06/27/2015 05/31/15   [name], MD  [facility] VERIO test strip Check blood sugar up  to 4 times a day Patient not taking: Reported on 06/27/2015 02/28/15   [name], MD  potassium chloride SA (K-DUR,KLOR-CON) 20 MEQ tablet Take 1 tablet (20 mEq total) by mouth 2 (two) times daily. Patient not taking: Reported on 06/27/2015 10/18/14   [name], MD   BP 90/51 mmHg  Pulse 88  Temp(Src) 99.6 F (37.6 C) (Rectal)  Resp 18  Ht 5' 1.81" (1.57 m)  Wt 187 lb 6.3 oz (85 kg)  BMI 34.48 kg/m2  SpO2 100% Physical Exam  Constitutional: She is oriented to person, place, and time. She appears well-developed and well-nourished. No distress.  HENT:  Head: Normocephalic and atraumatic.  Conjunctival pallor  Eyes: EOM are normal. Pupils are equal, round, and reactive to light.  Neck: Normal range of motion. Neck supple.  Cardiovascular: Normal rate and regular rhythm.  Exam reveals no gallop and no friction rub.   No murmur heard. Pulmonary/Chest: Effort normal. She has no wheezes. She has no rales.  Diffusely diminished breath sounds. Increased work of breathing.  Abdominal: Soft. She exhibits no distension. There is no tenderness. There is no rebound and no guarding.  Genitourinary:  Dark stool, no gross blood  Musculoskeletal: She exhibits no edema or tenderness.  Neurological: She is alert and oriented to person, place, and time.  Skin: Skin is warm and dry. She is not diaphoretic.  Psychiatric: She has a normal mood and affect. Her behavior is normal.  Nursing note and vitals reviewed.   ED Course  Procedures (including critical care time) Labs Review Labs  Reviewed  CBC WITH DIFFERENTIAL/PLATELET - Abnormal; Notable for the following:    RBC 1.98 (*)    Hemoglobin 3.6 (*)    HCT 13.5 (*)    MCV 68.2 (*)    MCH 18.2 (*)    MCHC 26.7 (*)    RDW 20.0 (*)    Monocytes Absolute 1.2 (*)    All other components within normal limits  BASIC METABOLIC PANEL - Abnormal; Notable for the following:    CO2 21 (*)    Glucose, Bld 355 (*)    Calcium 8.5 (*)    All other components within normal limits  BRAIN NATRIURETIC PEPTIDE - Abnormal; Notable for the following:    B Natriuretic Peptide 383.9 (*)    All other components within normal limits  PROTIME-INR - Abnormal; Notable for the following:    Prothrombin Time 36.2 (*)    INR 3.76 (*)    All other components within normal limits  POC OCCULT BLOOD, ED - Abnormal; Notable for the following:    Fecal Occult Bld POSITIVE (*)    All other components within normal limits  I-STAT CHEM 8, ED  I-STAT TROPOININ, ED  TYPE AND SCREEN  PREPARE RBC (CROSSMATCH)    Imaging Review Dg Chest Port 1 View  06/27/2015  CLINICAL DATA:  Shortness of breath for 6 weeks. Cough for 3 days. Intermittent chest pain. EXAM: PORTABLE CHEST 1 VIEW COMPARISON:  10/25/2014 FINDINGS: Mild cardiomegaly stable. Both lungs are clear. No evidence of pneumothorax or pleural effusion. Prior median sternotomy noted. Fixation plate and screws noted in the right clavicle. Old right posterior rib fracture deformities also noted. IMPRESSION: Stable mild cardiomegaly.  No active lung disease. Electronically Signed   By: Earle Gell M.D.   On: 06/27/2015 12:11   I have personally reviewed and evaluated these images and lab results as part of my medical decision-making.   EKG Interpretation None  MDM   Final diagnoses:  Acute GI bleeding  Symptomatic anemia    65 yo F with a chief complaint of shortness of breath. Patient found to be anemic with a hemoglobin of 3.8. Rectal exam with dark stool but no bright red blood.  Patient was supratherapeutic with an INR of 3.8. Will reverse this. Admit to stepdown.  CRITICAL CARE Performed by: Cecilio Asper   Total critical care time: 80 minutes  Critical care time was exclusive of separately billable procedures and treating other patients.  Critical care was necessary to treat or prevent imminent or life-threatening deterioration.  Critical care was time spent personally by me on the following activities: development of treatment plan with patient and/or surrogate as well as nursing, discussions with consultants, evaluation of patient's response to treatment, examination of patient, obtaining history from patient or surrogate, ordering and performing treatments and interventions, ordering and review of laboratory studies, ordering and review of radiographic studies, pulse oximetry and re-evaluation of patient's condition.     Deno Etienne, DO 06/27/15 1322

## 2015-06-27 NOTE — Patient Outreach (Signed)
[facility]) Care Management  06/27/2015  AKYLAH HASCALL 20-Feb-1950 [id number]   Assessment-CSW received return call and voice message from patient stating that she is not interested in receiving [facility] services and does not wish for [facility] to contact her again.  Plan-CSW will update [facility] team and complete case closure.  Eula Fried, BSW, MSW, [email or web address] Phone: [phone] Fax: [phone]

## 2015-06-27 NOTE — Patient Outreach (Signed)
[facility]) Care Management  06/27/2015  [name] [birth year] [id number]  Received message from [place], Jerene Pitch, that patient left her a voicemail stating she is no longer interested in [facility] services.  [facility] Pharmacist had left patient a voicemail last week after an unsuccessful attempt to reach patient.    Given notification from [place] and her documentation in chart from today, of patient not being interested in [facility] services at this time, will close out pharmacy case.    Karrie Meres, PharmD, [place] [phone]

## 2015-06-27 NOTE — ED Notes (Signed)
Per EMS - pt presents w/ shortness of breath. Appears pale, dyspnea w/ exertion and at rest. Hx anemia and blood transfusions. Pt states she just has not been feeling well x 6 weeks. Manual BP 130/58. 94% on 4L. Wears O2 at night and when going out. Hx smoking. Noncompliant w/ meds. Hx diabetes, COPD

## 2015-06-28 DIAGNOSIS — Z9981 Dependence on supplemental oxygen: Secondary | ICD-10-CM

## 2015-06-28 DIAGNOSIS — F17200 Nicotine dependence, unspecified, uncomplicated: Secondary | ICD-10-CM

## 2015-06-28 DIAGNOSIS — F329 Major depressive disorder, single episode, unspecified: Secondary | ICD-10-CM

## 2015-06-28 LAB — TYPE AND SCREEN
ABO/RH(D): A NEG
Antibody Screen: NEGATIVE
Unit division: 0
Unit division: 0
Unit division: 0

## 2015-06-28 LAB — GLUCOSE, CAPILLARY
Glucose-Capillary: 90 mg/dL (ref 65–99)
Glucose-Capillary: 207 mg/dL — ABNORMAL HIGH (ref 65–99)
Glucose-Capillary: 149 mg/dL — ABNORMAL HIGH (ref 65–99)
Glucose-Capillary: 145 mg/dL — ABNORMAL HIGH (ref 65–99)

## 2015-06-28 LAB — CBC
HCT: 22.9 % — ABNORMAL LOW (ref 36.0–46.0)
HCT: 23 % — ABNORMAL LOW (ref 36.0–46.0)
HCT: 23.8 % — ABNORMAL LOW (ref 36.0–46.0)
Hemoglobin: 7 g/dL — ABNORMAL LOW (ref 12.0–15.0)
Hemoglobin: 7 g/dL — ABNORMAL LOW (ref 12.0–15.0)
Hemoglobin: 7.2 g/dL — ABNORMAL LOW (ref 12.0–15.0)
MCH: 23.3 pg — ABNORMAL LOW (ref 26.0–34.0)
MCH: 23 pg — ABNORMAL LOW (ref 26.0–34.0)
MCH: 22.6 pg — ABNORMAL LOW (ref 26.0–34.0)
MCHC: 30.6 g/dL (ref 30.0–36.0)
MCHC: 30.4 g/dL (ref 30.0–36.0)
MCHC: 30.3 g/dL (ref 30.0–36.0)
MCV: 76.3 fL — ABNORMAL LOW (ref 78.0–100.0)
MCV: 75.7 fL — ABNORMAL LOW (ref 78.0–100.0)
MCV: 74.8 fL — ABNORMAL LOW (ref 78.0–100.0)
Platelets: 255 10*3/uL (ref 150–400)
Platelets: 259 10*3/uL (ref 150–400)
Platelets: 254 10*3/uL (ref 150–400)
RBC: 3 MIL/uL — ABNORMAL LOW (ref 3.87–5.11)
RBC: 3.04 MIL/uL — ABNORMAL LOW (ref 3.87–5.11)
RBC: 3.18 MIL/uL — ABNORMAL LOW (ref 3.87–5.11)
RDW: 20.8 % — ABNORMAL HIGH (ref 11.5–15.5)
RDW: 20.2 % — ABNORMAL HIGH (ref 11.5–15.5)
RDW: 19.7 % — ABNORMAL HIGH (ref 11.5–15.5)
WBC: 8.6 10*3/uL (ref 4.0–10.5)
WBC: 8.5 10*3/uL (ref 4.0–10.5)
WBC: 9.2 10*3/uL (ref 4.0–10.5)

## 2015-06-28 LAB — PROTIME-INR
INR: 1.5 — ABNORMAL HIGH (ref 0.00–1.49)
Prothrombin Time: 18.2 seconds — ABNORMAL HIGH (ref 11.6–15.2)

## 2015-06-28 LAB — RETICULOCYTES
RBC.: 2.9 MIL/uL — ABNORMAL LOW (ref 3.87–5.11)
Retic Count, Absolute: 52.2 10*3/uL (ref 19.0–186.0)
Retic Ct Pct: 1.8 % (ref 0.4–3.1)

## 2015-06-28 LAB — HEPATIC FUNCTION PANEL
ALT: 72 U/L — ABNORMAL HIGH (ref 14–54)
AST: 79 U/L — ABNORMAL HIGH (ref 15–41)
Albumin: 2.7 g/dL — ABNORMAL LOW (ref 3.5–5.0)
Alkaline Phosphatase: 84 U/L (ref 38–126)
Bilirubin, Direct: 0.4 mg/dL (ref 0.1–0.5)
Indirect Bilirubin: 1.1 mg/dL — ABNORMAL HIGH (ref 0.3–0.9)
Total Bilirubin: 1.5 mg/dL — ABNORMAL HIGH (ref 0.3–1.2)
Total Protein: 5.6 g/dL — ABNORMAL LOW (ref 6.5–8.1)

## 2015-06-28 LAB — SAVE SMEAR

## 2015-06-28 LAB — LACTATE DEHYDROGENASE: LDH: 264 U/L — ABNORMAL HIGH (ref 98–192)

## 2015-06-28 LAB — TECHNOLOGIST SMEAR REVIEW

## 2015-06-28 LAB — TROPONIN I: Troponin I: <0.03 ng/mL (ref <0.031)

## 2015-06-28 MED ORDER — FUROSEMIDE 80 MG PO TABS
80.0000 mg | ORAL_TABLET | Freq: Every day | ORAL | Status: DC
  Administered 2015-06-28 – 2015-07-03 (×4): 80 mg via ORAL
  Filled 2015-06-28 (×6): qty 1

## 2015-06-28 MED ORDER — FERUMOXYTOL INJECTION 510 MG/17 ML
510.0000 mg | Freq: Once | INTRAVENOUS | Status: AC
  Administered 2015-06-28: 510 mg via INTRAVENOUS
  Filled 2015-06-28: qty 17
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
## 2015-06-28 NOTE — Progress Notes (Signed)
Pharmacist Student Provided - Patient Medication Education Education Environmental health practitioner Service Patient  Education: Educated the patient on some of current medications, specifically rosuvastatin and the nicotine patch. Reviewed side effects with the patient. Answered the patients medication specific questions. These medications are on the patient's current med list and are subject to change upon discharge.  [name]  P4 PharmD Candidate

## 2015-06-28 NOTE — Progress Notes (Signed)
RT placed patient on CPAP HS. Patient toelrating welll with 2L bleed in. R will continue to monitor as needed.

## 2015-06-28 NOTE — Progress Notes (Signed)
Subjective: Patient seen and examined this morning.  She is sitting up in a chair and reports that she has not had a bowel movement.  Her breathing is better but she reports a cough for the past 3-4 days and has been having some epigastric discomfort.  Otherwise, she has no acute complaints.  Objective: Vital signs in last 24 hours: Filed Vitals:   06/28/15 0203 06/28/15 0349 06/28/15 0825 06/28/15 0826  BP: 159/70 156/69 147/64   Pulse:  76 73   Temp: 98.7 F (37.1 C) 97.9 F (36.6 C) 97.8 F (36.6 C)   TempSrc: Oral Oral Oral   Resp: _0 Height:      Weight:      SpO2: 94% 99% 98% 98%   Weight change:   Intake/Output Summary (Last 24 hours) at 06/28/15 1129 Last data filed at 06/28/15 0430  Gross per 24 hour  Intake 3462.5 ml  Output    925 ml  Net 2537.5 ml   General: sitting up in chair on room air, chronically ill-appearing, no distress HEENT: conjunctival pallor, EOMI Cardiac: distant heart sounds, RRR Pulm: normal effort, decreased at the bases, wheezing better Abd: soft, nontender, nondistended, BS present Ext: warm and well perfused, 1+ pedal edema Skin: + pallor Neuro: alert and oriented X3, cranial nerves II-XII grossly intact  Lab Results: Basic Metabolic Panel:  Recent Labs Lab 06/27/15 1139  NA 136  K 4.4  CL 106  CO2 21*  GLUCOSE 355*  BUN 13  CREATININE 0.94  CALCIUM 8.5*   Liver Function Tests:  Recent Labs Lab 06/28/15 0241  AST 79*  ALT 72*  ALKPHOS 84  BILITOT 1.5*  PROT 5.6*  ALBUMIN 2.7*   CBC:  Recent Labs Lab 06/27/15 1139 06/28/15 0241 06/28/15 0344  WBC 9.0 8.6 8.5  NEUTROABS 6.9  --   --   HGB 3.6* 7.0* 7.0*  HCT 13.5* 22.9* 23.0*  MCV 68.2* 76.3* 75.7*  PLT 329 255 259   Cardiac Enzymes:  Recent Labs Lab 06/27/15 1429 06/28/15 0241  TROPONINI <0.03 <0.03   CBG:  Recent Labs Lab 06/27/15 1448 06/27/15 1701 06/27/15 1959 06/28/15 0823  GLUCAP 333* 245* 189* 90   Coagulation:  Recent  Labs Lab 06/27/15 1139 06/28/15 0241  LABPROT 36.2* 18.2*  INR 3.76* 1.50*   Anemia Panel:  Recent Labs Lab 06/28/15 0241  RETICCTPCT 1.8   Urine Drug Screen: Drugs of Abuse     Component Value Date/Time   LABOPIA POSITIVE* 01/07/2013 2114   COCAINSCRNUR NONE DETECTED 01/07/2013 2114   LABBENZ NONE DETECTED 01/07/2013 2114   AMPHETMU NONE DETECTED 01/07/2013 2114   THCU NONE DETECTED 01/07/2013 2114   LABBARB NONE DETECTED 01/07/2013 2114     Micro Results: Recent Results (from the past 240 hour(s))  MRSA PCR Screening     Status: None   Collection Time: 06/27/15  2:28 PM  Result Value Ref Range Status   MRSA by PCR NEGATIVE NEGATIVE Final    Comment:        The GeneXpert MRSA Assay (FDA approved for NASAL specimens only), is one component of a comprehensive MRSA colonization surveillance program. It is not intended to diagnose MRSA infection nor to guide or monitor treatment for MRSA infections.    Studies/Results: Dg Chest Port 1 View  06/27/2015  CLINICAL DATA:  Shortness of breath for 6 weeks. Cough for 3 days. Intermittent chest pain. EXAM: PORTABLE CHEST 1 VIEW COMPARISON:  10/25/2014 FINDINGS: Mild  cardiomegaly stable. Both lungs are clear. No evidence of pneumothorax or pleural effusion. Prior median sternotomy noted. Fixation plate and screws noted in the right clavicle. Old right posterior rib fracture deformities also noted. IMPRESSION: Stable mild cardiomegaly.  No active lung disease. Electronically Signed   By: Earle Gell M.D.   On: 06/27/2015 12:11   Medications: I have reviewed the patient's current medications. Scheduled Meds: . alendronate  70 mg Oral Weekly  . buPROPion  100 mg Oral BID  . fluticasone  2 spray Each Nare Daily  . insulin aspart  0-5 Units Subcutaneous QHS  . insulin aspart  0-9 Units Subcutaneous TID WC  . mometasone-formoterol  2 puff Inhalation BID  . nicotine  14 mg Transdermal Daily  . pantoprazole (PROTONIX) IV  40 mg  Intravenous BID  . rosuvastatin  20 mg Oral QHS  . sertraline  200 mg Oral Daily  . tiotropium  18 mcg Inhalation Daily   Continuous Infusions:  PRN Meds:.albuterol, ALPRAZolam, HYDROcodone-acetaminophen, ondansetron (ZOFRAN) IV Assessment/Plan: Active Problems:   Hyperlipidemia   Major depression, chronic (HCC)   Anxiety   Gastroesophageal reflux disease   Type 2 diabetes mellitus with diabetic neuropathy (HCC)   Fibromyalgia   Chronic congestive heart failure with left ventricular diastolic dysfunction (HCC)   Paroxysmal atrial fibrillation (HCC)   Obesity (BMI 30.0-34.9)   Acute GI bleeding  Symptomatic Anemia/Slow GI Bleed: discussed at bedside with GI (Dr. Watt Climes) and plan for EGD tomorrow and consideration of capsule endoscopy after that.  Will hold off on colonoscopy at present time as most of her symptoms seem upper GI related refractory to home PPI.  She received 3 units of PRBC's and her hemoglobin is stable right now at 7.  I suspect she has had a slow, episodic GI bleed given her melena and profound anemia that is confounded by the fact she has iron deficiency and has not been taking any iron products.  Since she has been feeling fatigued for over 6 weeks, I think this has been going on for a while as opposed to acute blood loss.  She also has some epigastric discomfort and is on omeprazole at home.  She reported having been taking this - appreciate GI assistance.  Plan for EGD tomorrow - will give her a dose of Feraheme today - CBC at noon and then Q12H after if stable - transfuse if Hbg < 7 - follow PT/INR - PPI - maintain IV access  CHF: Echo from 08/2012 showing LVEF 55-60%. Lungs sounds are okay but does have some wheezes. She has been non-adherent to her Lasix and has some pitting edema of her lower extremities. She is also on potassium supplementation at home  -restart lasix -watch fluid balance, net 2.5 liters positive since admission -daily weights -strict  I/Os - watch electrolytes  DMII - Holding Levemir 20units BID given her clear diet - SSI- sensitive with HS  COPD: currently stable with no issues. She is on via [place]. Patient reports being on home oxygen. -continue Albuterol prn -continue Dulera BID -continue Spiriva daily   Depression -continue Buproprion 100 mg BID -continue Sertraline 200 mg daily  Tobacco use: currently smokes about 1 PPD -Nicotine patch   GERD -Protonix 40 mg IV Q12H  DVT ppx: SCDs  Diet: clears, NPO at MN  Code: FULL  Dispo: Disposition is deferred at this time, awaiting improvement of current medical problems.  Anticipated discharge in approximately 2-3 day(s).   The patient does have a current  PCP [name], MD) and does need an [facility] hospital follow-up appointment after discharge.  The patient does not have transportation limitations that hinder transportation to clinic appointments.  .Services Needed at time of discharge: Y = Yes, Blank = No PT:   OT:   RN:   Equipment:   Other:     LOS: 1 day   Jule Ser, DO 06/28/2015, 11:29 AM

## 2015-06-28 NOTE — Progress Notes (Signed)
   06/28/15 1200  Clinical Encounter Type  Visited With Patient  Visit Type Initial  Referral From Patient  Spiritual Encounters  Spiritual Needs Emotional  Stress Factors  Patient Stress Factors Lack of caregivers;Financial concerns;Health changes  Chaplain had good visit with patient, who is adjusting to major health issues along with some personal issues. Will return tomorrow. BOSWELL, PATRICIA, Chaplain

## 2015-06-28 NOTE — Progress Notes (Signed)
  Date: 06/28/2015  Patient name: [name]  Medical record number: [id number]  Date of birth: 09-18-1950   I have seen and evaluated [name] and discussed their care with the Residency Team. Briefly, [name] is a 65yo woman well known to the teaching service.  She presents with lethargy, SOB and shaking episodes.  She stated that she felt anemic for about 6 weeks.  She has had issues with this in the past and had a workup in 2013 which did not reveal a source of bleeding.  She was also recently admitted in Nov of 2016 for symptomatic anemia, but she declined to see GI at that time.  Further symptoms include melena which is intermittent and PICA with eating ice.  She has continued to take coumadin and her antidepressants (though she told her PCP Dr. Eppie Gibson today that she was not taking her antidepressants.)  She denies any abdominal pain.  She has not increased NSAID use, but does report taking her PPI and does note that she has had increased GERD.  In the ED, her Hgb was very low (3.8), INR was elevated and FOBT was positive.  GI was consulted.  Retic count was normal.   Fam Hx, and/or Soc Hx : Father with PUD and MI.  She is a current smoker.  Filed Vitals:   06/28/15 1212 06/28/15 1400  BP: 126/57 138/64  Pulse: 75 89  Temp: 97.8 F (36.6 C)   Resp: 19 19   Physical exam Gen: Sitting in bed, speaking in full sentences, fatigued appearing Eyes: Anicteric sclerae, + conjunctival pallor CV: RR, NR, no murmur Pulm: CTAB, breathing comfortably Abd: Soft, NT, ND, +BS Ext: 1+ pedal edema, no bruising Skin: Pale  Pertinent data Hgb 3.8 --> 7.0  FOBT +  EKG: Sinus, low voltage  Assessment and Plan: I have seen and evaluated the patient as outlined above. I agree with the formulated Assessment and Plan as detailed in the residents' note, with the following changes:   1. Symptomatic anemia - I am not sure that she has an active GI bleed.  She has been feeling poorly for  weeks now, was anemic in November and has not had CBC checked since then.  She has continued to take her coumadin.  However, she is having melena and had FOBT + stool.  Upper GI source cannot totally be ruled out.  Unclear if she is actually taking her PPI.   - GI to do EGD, possible capsule endoscopy - Trend CBC - IV protonix - Blood products to keep Hgb > 7.0 - She received K centra to reverse INR - Telemetry  2. CHF - Given anemia, holding diuretics for now, but would start again if LE edema worsens - Daily weights - Strict I/O  3. Afib - Telemetry - hold coumadin - Monitor pulse, currently rate controlled  4. Depression - Confirm that she is not taking antidepressants - If not, start Prozac.   Sid Falcon, MD 5/18/20174:47 PM

## 2015-06-28 NOTE — Progress Notes (Signed)
Fiza A Gehlhausen 9:40 AM  Subjective: Patient doing better and case discussed with the hospital team and no signs of bleeding and no bowel movements  Objective: Vital signs stable afebrile no acute distress decreased wheezing abdomen is soft nontender hemoglobin increased  Assessment: Episodic GI blood loss in a patient on Coumadin  Plan: After our discussion with the patient and the hospital team will proceed with repeat endoscopy first since she has increased upper tract symptoms and consider capsule endoscopy next and will proceed with endoscopy tomorrow at 11 AM  [facility]  Pager [phone] After 5PM or if no answer call [phone]

## 2015-06-28 NOTE — Progress Notes (Signed)
   06/28/15 0900  Clinical Encounter Type  Visited With Patient;Health care provider  Visit Type Other (Comment) (Asvanced Directive)  Referral From Patient  Spiritual Encounters  Spiritual Needs Literature  Advance Directives (For Healthcare)  Does patient have an advance directive? No  Would patient like information on creating an advanced directive? Yes - Educational materials given  [facility] met with pt and gave materials to review and complete; contact spiritual care to review completed documents. Gwynn Burly 9:44 AM

## 2015-06-28 NOTE — Progress Notes (Signed)
Patient request CPAP to sleep, wishes to use it this afternoon to also take nap. MD made aware via text page.

## 2015-06-29 ENCOUNTER — Ambulatory Visit: Payer: PPO | Admitting: Internal Medicine

## 2015-06-29 ENCOUNTER — Encounter (HOSPITAL_COMMUNITY)
Admission: EM | Disposition: A | Discharge status: Home / Self Care | Payer: Self-pay | Source: Home / Self Care | Attending: Internal Medicine

## 2015-06-29 ENCOUNTER — Ambulatory Visit: Payer: PPO

## 2015-06-29 ENCOUNTER — Inpatient Hospital Stay (HOSPITAL_COMMUNITY): Payer: PPO | Admitting: Anesthesiology

## 2015-06-29 ENCOUNTER — Encounter (HOSPITAL_COMMUNITY): Payer: Self-pay | Admitting: *Deleted

## 2015-06-29 DIAGNOSIS — I509 Heart failure, unspecified: Secondary | ICD-10-CM | POA: Diagnosis not present

## 2015-06-29 DIAGNOSIS — I10 Essential (primary) hypertension: Secondary | ICD-10-CM | POA: Diagnosis not present

## 2015-06-29 DIAGNOSIS — K922 Gastrointestinal hemorrhage, unspecified: Secondary | ICD-10-CM | POA: Diagnosis not present

## 2015-06-29 DIAGNOSIS — D500 Iron deficiency anemia secondary to blood loss (chronic): Secondary | ICD-10-CM | POA: Diagnosis not present

## 2015-06-29 DIAGNOSIS — Z9889 Other specified postprocedural states: Secondary | ICD-10-CM

## 2015-06-29 DIAGNOSIS — D649 Anemia, unspecified: Secondary | ICD-10-CM | POA: Diagnosis not present

## 2015-06-29 DIAGNOSIS — J449 Chronic obstructive pulmonary disease, unspecified: Secondary | ICD-10-CM | POA: Diagnosis not present

## 2015-06-29 HISTORY — PX: ESOPHAGOGASTRODUODENOSCOPY: SHX5428

## 2015-06-29 HISTORY — PX: GIVENS CAPSULE STUDY: SHX5432

## 2015-06-29 LAB — BASIC METABOLIC PANEL
Anion gap: 6 (ref 5–15)
BUN: 6 mg/dL (ref 6–20)
CO2: 26 mmol/L (ref 22–32)
Calcium: 8.3 mg/dL — ABNORMAL LOW (ref 8.9–10.3)
Chloride: 108 mmol/L (ref 101–111)
Creatinine, Ser: 0.86 mg/dL (ref 0.44–1.00)
GFR calc Af Amer: >60 mL/min (ref >60)
GFR calc non Af Amer: >60 mL/min (ref >60)
Glucose, Bld: 157 mg/dL — ABNORMAL HIGH (ref 65–99)
Potassium: 4.1 mmol/L (ref 3.5–5.1)
Sodium: 140 mmol/L (ref 135–145)

## 2015-06-29 LAB — CBC
HCT: 24.4 % — ABNORMAL LOW (ref 36.0–46.0)
HCT: 24.1 % — ABNORMAL LOW (ref 36.0–46.0)
Hemoglobin: 7.2 g/dL — ABNORMAL LOW (ref 12.0–15.0)
Hemoglobin: 7.2 g/dL — ABNORMAL LOW (ref 12.0–15.0)
MCH: 22.2 pg — ABNORMAL LOW (ref 26.0–34.0)
MCH: 22.6 pg — ABNORMAL LOW (ref 26.0–34.0)
MCHC: 29.5 g/dL — ABNORMAL LOW (ref 30.0–36.0)
MCHC: 29.9 g/dL — ABNORMAL LOW (ref 30.0–36.0)
MCV: 75.1 fL — ABNORMAL LOW (ref 78.0–100.0)
MCV: 75.8 fL — ABNORMAL LOW (ref 78.0–100.0)
Platelets: 256 10*3/uL (ref 150–400)
Platelets: 247 10*3/uL (ref 150–400)
RBC: 3.25 MIL/uL — ABNORMAL LOW (ref 3.87–5.11)
RBC: 3.18 MIL/uL — ABNORMAL LOW (ref 3.87–5.11)
RDW: 20.1 % — ABNORMAL HIGH (ref 11.5–15.5)
RDW: 20.4 % — ABNORMAL HIGH (ref 11.5–15.5)
WBC: 7.4 10*3/uL (ref 4.0–10.5)
WBC: 6.8 10*3/uL (ref 4.0–10.5)

## 2015-06-29 LAB — GLUCOSE, CAPILLARY
Glucose-Capillary: 134 mg/dL — ABNORMAL HIGH (ref 65–99)
Glucose-Capillary: 127 mg/dL — ABNORMAL HIGH (ref 65–99)
Glucose-Capillary: 231 mg/dL — ABNORMAL HIGH (ref 65–99)
Glucose-Capillary: 268 mg/dL — ABNORMAL HIGH (ref 65–99)

## 2015-06-29 LAB — PROTIME-INR
INR: 1.41 (ref 0.00–1.49)
Prothrombin Time: 17.3 seconds — ABNORMAL HIGH (ref 11.6–15.2)

## 2015-06-29 LAB — HAPTOGLOBIN: Haptoglobin: 194 mg/dL (ref 34–200)

## 2015-06-29 SURGERY — EGD (ESOPHAGOGASTRODUODENOSCOPY)
Anesthesia: Monitor Anesthesia Care

## 2015-06-29 MED ORDER — PROPOFOL 500 MG/50ML IV EMUL
INTRAVENOUS | Status: DC | PRN
  Administered 2015-06-29: 10:00:00 via INTRAVENOUS
  Administered 2015-06-29: 125 ug/kg/min via INTRAVENOUS

## 2015-06-29 MED ORDER — PROPOFOL 10 MG/ML IV BOLUS
INTRAVENOUS | Status: DC | PRN
  Administered 2015-06-29 (×2): 25 mg via INTRAVENOUS

## 2015-06-29 MED ORDER — PROPOFOL 500 MG/50ML IV EMUL: INTRAVENOUS | Status: DC | PRN

## 2015-06-29 MED ORDER — LACTATED RINGERS IV SOLN
INTRAVENOUS | Status: DC
  Administered 2015-06-29: 09:00:00 via INTRAVENOUS

## 2015-06-29 MED ORDER — SODIUM CHLORIDE 0.9 % IV SOLN: INTRAVENOUS | Status: DC

## 2015-06-29 MED ORDER — LACTATED RINGERS IV SOLN
INTRAVENOUS | Status: DC | PRN
  Administered 2015-06-29: 09:00:00 via INTRAVENOUS

## 2015-06-29 MED ORDER — SERTRALINE HCL 100 MG PO TABS
100.0000 mg | ORAL_TABLET | Freq: Every day | ORAL | Status: AC
  Administered 2015-06-29: 200 mg via ORAL
  Administered 2015-06-30 – 2015-07-01 (×2): 100 mg via ORAL
  Filled 2015-06-29 (×2): qty 1

## 2015-06-29 MED ORDER — LIDOCAINE HCL (CARDIAC) 20 MG/ML IV SOLN
INTRAVENOUS | Status: DC | PRN
  Administered 2015-06-29: 50 mg via INTRAVENOUS

## 2015-06-29 MED ORDER — SERTRALINE HCL 50 MG PO TABS
50.0000 mg | ORAL_TABLET | Freq: Every day | ORAL | Status: DC
  Administered 2015-07-02 – 2015-07-03 (×2): 50 mg via ORAL
  Filled 2015-06-29 (×3): qty 1

## 2015-06-29 NOTE — Anesthesia Post-op Follow-up Note (Incomplete)
  Anesthesia Pain Follow-up Note  Patient: [name]  Day #: {NUMBER [id number]}  Date of Follow-up: 06/29/2015 Time: 10:05 AM  Last Vitals:  Filed Vitals:   06/29/15 0954 06/29/15 1004  BP:  159/65  Pulse:  90  Temp:    Resp: 18 18    Level of Consciousness: {Findings; LOC exam:17915::"alert"}  Pain: {Desc; pain severity:12299}   Side Effects:{CHL AN SIDE EFFECTS:22537}  Catheter Site Exam:{Exam; wound:13612}  Plan: {CHL AN PAIN ASSESSMENT / PMVA:27737}  ADAMI,RICHARD

## 2015-06-29 NOTE — Anesthesia Preprocedure Evaluation (Signed)
Anesthesia Evaluation  Patient identified by MRN, date of birth, ID band Patient awake    Reviewed: Allergy & Precautions, H&P , NPO status , Patient's Chart, lab work & pertinent test results  Airway Mallampati: II  TM Distance: >3 FB Neck ROM: Full    Dental no notable dental hx.    Pulmonary shortness of breath, sleep apnea and Continuous Positive Airway Pressure Ventilation , COPD,  COPD inhaler and oxygen dependent, Current Smoker,  Oxygen one liter/min 24 hours /day.   Pulmonary exam normal breath sounds clear to auscultation       Cardiovascular hypertension, + CAD and +CHF  Normal cardiovascular exam+ dysrhythmias  Rhythm:Regular Rate:Normal  ECG reviewed: SR with first degree AV block, pulmonary disease pattern, iRBBB  H/O pulmonary HTN.  Reviewed Dr. Jacalyn Lefevre note of 06/24/11. S/P MVR August 2012. H/O A. Fib.   Neuro/Psych PSYCHIATRIC DISORDERS Anxiety Depression  Neuromuscular disease negative neurological ROS     GI/Hepatic negative GI ROS, Neg liver ROS, GERD  ,  Endo/Other  diabetes, Type 1, Insulin Dependent  Renal/GU negative Renal ROS  negative genitourinary   Musculoskeletal negative musculoskeletal ROS (+) Fibromyalgia -  Abdominal (+) + obese,   Peds negative pediatric ROS (+)  Hematology  (+) anemia ,   Anesthesia Other Findings   Reproductive/Obstetrics negative OB ROS                             Anesthesia Physical  Anesthesia Plan  ASA: IV  Anesthesia Plan: MAC   Post-op Pain Management:    Induction: Intravenous  Airway Management Planned: Nasal Cannula  Additional Equipment:   Intra-op Plan:   Post-operative Plan:   Informed Consent: I have reviewed the patients History and Physical, chart, labs and discussed the procedure including the risks, benefits and alternatives for the proposed anesthesia with the patient or authorized representative who  has indicated his/her understanding and acceptance.   Dental advisory given  Plan Discussed with: CRNA  Anesthesia Plan Comments:         Anesthesia Quick Evaluation

## 2015-06-29 NOTE — Transfer of Care (Signed)
Immediate Anesthesia Transfer of Care Note  Patient: [name]  Procedure(s) Performed: Procedure(s): ESOPHAGOGASTRODUODENOSCOPY (EGD) (N/A) GIVENS CAPSULE STUDY (N/A)  Patient Location: PACU and Endoscopy Unit  Anesthesia Type:MAC  Level of Consciousness: sedated  Airway & Oxygen Therapy: Patient Spontanous Breathing and Patient connected to face mask oxygen  Post-op Assessment: Report given to RN and Post -op Vital signs reviewed and stable  Post vital signs: Reviewed and stable  Last Vitals:  Filed Vitals:   06/29/15 0954 06/29/15 1004  BP:  159/65  Pulse:  90  Temp:    Resp: 18 18    Last Pain:  Filed Vitals:   06/29/15 1005  PainSc: 0-No pain      Patients Stated Pain Goal: 2 (33/58/25 1898)  Complications: No apparent anesthesia complications

## 2015-06-29 NOTE — Anesthesia Procedure Notes (Signed)
Procedure Name: MAC Date/Time: 06/29/2015 9:25 AM Performed by: Eligha Bridegroom Pre-anesthesia Checklist: Emergency Drugs available, Patient identified, Timeout performed, Suction available and Patient being monitored Patient Re-evaluated:Patient Re-evaluated prior to inductionOxygen Delivery Method: Nasal cannula Preoxygenation: Pre-oxygenation with 100% oxygen Intubation Type: IV induction

## 2015-06-29 NOTE — Progress Notes (Signed)
Subjective: Patient seen and examined this morning.  She is sitting up in a chair and reports no new complaints and has not had any noticeable bleeding.  She tells me this morning that leading up to her admission, she was taking her sertraline but not taking her wellbutrin.  She was taking her PPI as prescribed.  Objective: Vital signs in last 24 hours: Filed Vitals:   06/29/15 1015 06/29/15 1020 06/29/15 1030 06/29/15 1100  BP: 162/58 162/58 126/82 128/70  Pulse: 84 79 77 78  Temp:    98.1 F (36.7 C)  TempSrc:    Oral  Resp: _0 Height:      Weight:      SpO2: 100% 100% 100% 98%   Weight change:   Intake/Output Summary (Last 24 hours) at 06/29/15 1229 Last data filed at 06/29/15 0958  Gross per 24 hour  Intake   1168 ml  Output   4650 ml  Net  -3482 ml   General: sitting up in chair on room air, chronically ill-appearing, no distress HEENT: conjunctival pallor, EOMI Cardiac: distant heart sounds, RRR Pulm: normal effort Abd: soft, nontender, nondistended, BS present Ext: warm and well perfused, 1+ pedal edema Skin: + pallor Neuro: alert and oriented X3, cranial nerves II-XII grossly intact  Lab Results: Basic Metabolic Panel:  Recent Labs Lab 06/27/15 1139 06/29/15 0439  NA 136 140  K 4.4 4.1  CL 106 108  CO2 21* 26  GLUCOSE 355* 157*  BUN 13 6  CREATININE 0.94 0.86  CALCIUM 8.5* 8.3*   Liver Function Tests:  Recent Labs Lab 06/28/15 0241  AST 79*  ALT 72*  ALKPHOS 84  BILITOT 1.5*  PROT 5.6*  ALBUMIN 2.7*   CBC:  Recent Labs Lab 06/27/15 1139  06/28/15 2320 06/29/15 1126  WBC 9.0  < > 7.4 6.8  NEUTROABS 6.9  --   --   --   HGB 3.6*  < > 7.2* 7.2*  HCT 13.5*  < > 24.4* 24.1*  MCV 68.2*  < > 75.1* 75.8*  PLT 329  < > 256 247  < > = values in this interval not displayed. Cardiac Enzymes:  Recent Labs Lab 06/27/15 1429 06/28/15 0241  TROPONINI <0.03 <0.03   CBG:  Recent Labs Lab 06/27/15 1959 06/28/15 0823  06/28/15 1209 06/28/15 1657 06/28/15 2049 06/29/15 0741  GLUCAP 189* 90 207* 149* 145* 134*   Coagulation:  Recent Labs Lab 06/27/15 1139 06/28/15 0241 06/29/15 0439  LABPROT 36.2* 18.2* 17.3*  INR 3.76* 1.50* 1.41   Anemia Panel:  Recent Labs Lab 06/28/15 0241  RETICCTPCT 1.8   Urine Drug Screen: Drugs of Abuse     Component Value Date/Time   LABOPIA POSITIVE* 01/07/2013 2114   COCAINSCRNUR NONE DETECTED 01/07/2013 2114   LABBENZ NONE DETECTED 01/07/2013 2114   AMPHETMU NONE DETECTED 01/07/2013 2114   THCU NONE DETECTED 01/07/2013 2114   LABBARB NONE DETECTED 01/07/2013 2114     Micro Results: Recent Results (from the past 240 hour(s))  MRSA PCR Screening     Status: None   Collection Time: 06/27/15  2:28 PM  Result Value Ref Range Status   MRSA by PCR NEGATIVE NEGATIVE Final    Comment:        The GeneXpert MRSA Assay (FDA approved for NASAL specimens only), is one component of a comprehensive MRSA colonization surveillance program. It is not intended to diagnose MRSA infection nor to guide or monitor treatment for  MRSA infections.    Studies/Results: No results found. Medications: I have reviewed the patient's current medications. Scheduled Meds: . alendronate  70 mg Oral Weekly  . buPROPion  100 mg Oral BID  . fluticasone  2 spray Each Nare Daily  . furosemide  80 mg Oral Daily  . insulin aspart  0-5 Units Subcutaneous QHS  . insulin aspart  0-9 Units Subcutaneous TID WC  . mometasone-formoterol  2 puff Inhalation BID  . nicotine  14 mg Transdermal Daily  . pantoprazole (PROTONIX) IV  40 mg Intravenous BID  . rosuvastatin  20 mg Oral QHS  . sertraline  200 mg Oral Daily  . tiotropium  18 mcg Inhalation Daily   Continuous Infusions:  PRN Meds:.albuterol, ALPRAZolam, HYDROcodone-acetaminophen, ondansetron (ZOFRAN) IV Assessment/Plan: Active Problems:   Hyperlipidemia   Major depression, chronic (HCC)   Anxiety   Gastroesophageal reflux  disease   Type 2 diabetes mellitus with diabetic neuropathy (HCC)   Fibromyalgia   Chronic congestive heart failure with left ventricular diastolic dysfunction (HCC)   Paroxysmal atrial fibrillation (HCC)   Obesity (BMI 30.0-34.9)   Acute GI bleeding  Symptomatic Anemia/Slow GI Bleed: patient to go for EGD today, further work-up dependent on those findings.  Her last 3 CBC's have resulted in a stable hemoglobin.  No new CBC this AM.  Right now we are monitoring Q12H.  I suspect she has had a slow, episodic GI bleed given her melena and profound anemia that is confounded by the fact she has iron deficiency and has not been taking any iron products.  Since she has been feeling fatigued for over 6 weeks, I think this has been going on for a while as opposed to acute blood loss.  She also has some epigastric discomfort and is on omeprazole at home.   - appreciate GI assistance, EGD today - CBC Q12H, if stable change to Q24H - transfuse if Hbg < 7 - follow PT/INR - PPI - maintain IV access  CHF: Echo from 08/2012 showing LVEF 55-60%. Lungs sounds are okay but does have some wheezes. She has been non-adherent to her Lasix and has some pitting edema of her lower extremities. She is also on potassium supplementation at home  -restart lasix -watch fluid balance, net 1 liter since admission -daily weights -strict I/Os - watch electrolytes  DMII - Holding Levemir 20units BID given her clear diet - SSI- sensitive with HS  COPD: currently stable with no issues. Patient reports being on home oxygen. -continue Albuterol prn -continue Dulera BID -continue Spiriva daily   Atrial Fibrillation - telemetry - holding coumadin - monitor rate controll  Depression -stop Buproprion 100 mg BID -begin tapering Sertraline 200 mg daily.  Will try 129m x 3 days, 535mx 3 days, then off -after tapering off Sertraline, begin Prozac  Tobacco use: currently smokes about 1 PPD -Nicotine patch    GERD -Protonix 40 mg IV Q12H  DVT ppx: SCDs  Diet: clears, NPO at MN  Code: FULL  Dispo: Disposition is deferred at this time, awaiting improvement of current medical problems.  Anticipated discharge in approximately 2-3 day(s).   The patient does have a current PCP (LOval LinseyMD) and [facility].  The patient does not have transportation limitations that hinder transportation to clinic appointments.  .Services Needed at time of discharge: Y = Yes, Blank = No PT:   OT:   RN:   Equipment:   Other:  LOS: 2 days   Jule Ser, DO 06/29/2015, 12:29 PM

## 2015-06-29 NOTE — Op Note (Signed)
[facility] Patient Name: [name] Procedure Date : 06/29/2015 MRN: [id number] Attending MD: Clarene Essex , MD Date of Birth: Jun 28, 1950 CSN: [id number] Age: 65 Admit Type: Inpatient Procedure:                Upper GI endoscopy Indications:              Iron deficiency anemia secondary to chronic blood                            loss Providers:                Clarene Essex, MD, Cleda Daub, RN, Elspeth Cho,                            Technician Referring MD:              Medicines:                Propofol total dose 628 mg IV Complications:            No immediate complications. Estimated Blood Loss:     Estimated blood loss: none. Procedure:                Pre-Anesthesia Assessment:                           - Prior to the procedure, a History and Physical                            was performed, and patient medications and                            allergies were reviewed. The patient's tolerance of                            previous anesthesia was also reviewed. The risks                            and benefits of the procedure and the sedation                            options and risks were discussed with the patient.                            All questions were answered, and informed consent                            was obtained. Prior Anticoagulants: The patient                            last took Coumadin (warfarin) 2 days prior to the                            procedure. ASA Grade Assessment: III - A patient  with severe systemic disease. After reviewing the                            risks and benefits, the patient was deemed in                            satisfactory condition to undergo the procedure.                           After obtaining informed consent, the endoscope was                            passed under direct vision. Throughout the                            procedure, the patient's blood pressure,  pulse, and                            oxygen saturations were monitored continuously. The                            EG-[id number] ([id number]) scope was introduced through the                            mouth, and advanced to the third part of duodenum.                            The upper GI endoscopy was accomplished without                            difficulty. The patient tolerated the procedure                            well. Scope In: Scope Out: Findings:      The larynx was normal.      A small hiatal hernia was present.      One mild benign-appearing, intrinsic stenosis was found. And was       traversed.      Food was found in the entire esophagus.      The entire examined stomach was normal.      The duodenal bulb, first portion of the duodenum, second portion of the       duodenum and third portion of the duodenum were normal.      The exam was otherwise without abnormality. Impression:               - Normal larynx.                           - Small hiatal hernia.                           - Benign-appearing esophageal stenosis.                           - Food in the esophagus.                           -  Normal stomach.                           - Normal duodenal bulb, first portion of the                            duodenum, second portion of the duodenum and third                            portion of the duodenum.                           - The examination was otherwise normal.                           - No specimens collected. Moderate Sedation:      moderate sedation-none Recommendation:           - Observe patient in same day observation unit for                            ongoing care.                           - Advance diet as tolerated.                           - Continue present medications.                           - Return to GI clinic PRN.                           - Telephone GI clinic if symptomatic PRN.                           - To visualize the  small bowel, perform video                            capsule endoscopy today. Procedure Code(s):        --- Professional ---                           [phone], Esophagogastroduodenoscopy, flexible,                            transoral; diagnostic, including collection of                            specimen(s) by brushing or washing, when performed                            (separate procedure) Diagnosis Code(s):        --- Professional ---                           K44.9, Diaphragmatic hernia without obstruction or  gangrene                           K22.2, Esophageal obstruction                           B34.037Q, Food in esophagus causing other injury,                            initial encounter                           D50.0, Iron deficiency anemia secondary to blood                            loss (chronic) CPT copyright 2016 [employer] Association. All rights reserved. The codes documented in this report are preliminary and upon coder review may  be revised to meet current compliance requirements. Clarene Essex, MD 06/29/2015 10:02:28 AM This report has been signed electronically. Number of Addenda: 0

## 2015-06-29 NOTE — Progress Notes (Signed)
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

## 2015-06-29 NOTE — Anesthesia Postprocedure Evaluation (Signed)
Anesthesia Post Note  Patient: [name]  Procedure(s) Performed: Procedure(s) (LRB): ESOPHAGOGASTRODUODENOSCOPY (EGD) (N/A) GIVENS CAPSULE STUDY (N/A)  Patient location during evaluation: PACU Anesthesia Type: MAC Level of consciousness: awake and alert Pain management: pain level controlled Vital Signs Assessment: post-procedure vital signs reviewed and stable Respiratory status: spontaneous breathing, nonlabored ventilation, respiratory function stable and patient connected to nasal cannula oxygen Cardiovascular status: stable and blood pressure returned to baseline Anesthetic complications: no    Last Vitals:  Filed Vitals:   06/29/15 1020 06/29/15 1030  BP: 162/58 126/82  Pulse: 79 77  Temp:    Resp: 19 15    Last Pain:  Filed Vitals:   06/29/15 1035  PainSc: 0-No pain                 Zenaida Deed

## 2015-06-30 ENCOUNTER — Encounter (HOSPITAL_COMMUNITY)
Admission: EM | Disposition: A | Discharge status: Home / Self Care | Payer: Self-pay | Source: Home / Self Care | Attending: Internal Medicine

## 2015-06-30 DIAGNOSIS — D509 Iron deficiency anemia, unspecified: Secondary | ICD-10-CM | POA: Diagnosis not present

## 2015-06-30 DIAGNOSIS — D649 Anemia, unspecified: Secondary | ICD-10-CM | POA: Diagnosis not present

## 2015-06-30 DIAGNOSIS — I509 Heart failure, unspecified: Secondary | ICD-10-CM | POA: Diagnosis not present

## 2015-06-30 DIAGNOSIS — E119 Type 2 diabetes mellitus without complications: Secondary | ICD-10-CM | POA: Diagnosis not present

## 2015-06-30 DIAGNOSIS — K922 Gastrointestinal hemorrhage, unspecified: Secondary | ICD-10-CM | POA: Diagnosis not present

## 2015-06-30 HISTORY — PX: GIVENS CAPSULE STUDY: SHX5432

## 2015-06-30 LAB — CBC
HCT: 24.5 % — ABNORMAL LOW (ref 36.0–46.0)
Hemoglobin: 7.3 g/dL — ABNORMAL LOW (ref 12.0–15.0)
MCH: 22.7 pg — ABNORMAL LOW (ref 26.0–34.0)
MCHC: 29.8 g/dL — ABNORMAL LOW (ref 30.0–36.0)
MCV: 76.1 fL — ABNORMAL LOW (ref 78.0–100.0)
Platelets: 237 10*3/uL (ref 150–400)
RBC: 3.22 MIL/uL — ABNORMAL LOW (ref 3.87–5.11)
RDW: 21.3 % — ABNORMAL HIGH (ref 11.5–15.5)
WBC: 7.2 10*3/uL (ref 4.0–10.5)

## 2015-06-30 LAB — GLUCOSE, CAPILLARY
Glucose-Capillary: 224 mg/dL — ABNORMAL HIGH (ref 65–99)
Glucose-Capillary: 135 mg/dL — ABNORMAL HIGH (ref 65–99)
Glucose-Capillary: 178 mg/dL — ABNORMAL HIGH (ref 65–99)
Glucose-Capillary: 174 mg/dL — ABNORMAL HIGH (ref 65–99)

## 2015-06-30 SURGERY — IMAGING PROCEDURE, GI TRACT, INTRALUMINAL, VIA CAPSULE
Anesthesia: LOCAL

## 2015-06-30 MED ORDER — INSULIN DETEMIR 100 UNIT/ML ~~LOC~~ SOLN
15.0000 [IU] | Freq: Every day | SUBCUTANEOUS | Status: DC
  Administered 2015-06-30 – 2015-07-02 (×3): 15 [IU] via SUBCUTANEOUS
  Filled 2015-06-30 (×3): qty 0.15

## 2015-06-30 SURGICAL SUPPLY — 1 items: TOWEL COTTON PACK 4EA (MISCELLANEOUS) ×4 IMPLANT

## 2015-06-30 NOTE — Progress Notes (Signed)
Subjective: EGD was normal. Has capsule study pending. hgb stable at 7.3. No dark stool or other source of bleeding currently.   Objective: Vital signs in last 24 hours: Filed Vitals:   06/29/15 2137 06/29/15 2321 06/30/15 0020 06/30/15 0334  BP:  101/81  132/53  Pulse:  73 77 80  Temp:  97.8 F (36.6 C)  98.5 F (36.9 C)  TempSrc:  Oral  Oral  Resp:  _0 Height:    _1  (1.575 m)  Weight:    188 lb 12.8 oz (85.639 kg)  SpO2: 96% 96% 97% 97%   Weight change:   Intake/Output Summary (Last 24 hours) at 06/30/15 1610 Last data filed at 06/29/15 2345  Gross per 24 hour  Intake   1400 ml  Output   2400 ml  Net  -1000 ml   General: sitting up in chair on room air, chronically ill-appearing, no distress HEENT: conjunctival pallor, EOMI Cardiac: distant heart sounds, RRR Pulm: normal effort Abd: soft, nontender, nondistended, BS present Ext: warm and well perfused, 1+ pedal edema Skin: + pallor Neuro: alert and oriented X3, cranial nerves II-XII grossly intact  Lab Results: Basic Metabolic Panel:  Recent Labs Lab 06/27/15 1139 06/29/15 0439  NA 136 140  K 4.4 4.1  CL 106 108  CO2 21* 26  GLUCOSE 355* 157*  BUN 13 6  CREATININE 0.94 0.86  CALCIUM 8.5* 8.3*   Liver Function Tests:  Recent Labs Lab 06/28/15 0241  AST 79*  ALT 72*  ALKPHOS 84  BILITOT 1.5*  PROT 5.6*  ALBUMIN 2.7*   CBC:  Recent Labs Lab 06/27/15 1139  06/29/15 1126 06/30/15 0256  WBC 9.0  < > 6.8 7.2  NEUTROABS 6.9  --   --   --   HGB 3.6*  < > 7.2* 7.3*  HCT 13.5*  < > 24.1* 24.5*  MCV 68.2*  < > 75.8* 76.1*  PLT 329  < > 247 237  < > = values in this interval not displayed. Cardiac Enzymes:  Recent Labs Lab 06/27/15 1429 06/28/15 0241  TROPONINI <0.03 <0.03   CBG:  Recent Labs Lab 06/28/15 2049 06/29/15 0741 06/29/15 1150 06/29/15 1559 06/29/15 2148 06/30/15 0604  GLUCAP 145* 134* 127* 231* 268* 224*   Coagulation:  Recent Labs Lab 06/27/15 1139  06/28/15 0241 06/29/15 0439  LABPROT 36.2* 18.2* 17.3*  INR 3.76* 1.50* 1.41   Anemia Panel:  Recent Labs Lab 06/28/15 0241  RETICCTPCT 1.8   Urine Drug Screen: Drugs of Abuse     Component Value Date/Time   LABOPIA POSITIVE* 01/07/2013 2114   COCAINSCRNUR NONE DETECTED 01/07/2013 2114   LABBENZ NONE DETECTED 01/07/2013 2114   AMPHETMU NONE DETECTED 01/07/2013 2114   THCU NONE DETECTED 01/07/2013 2114   LABBARB NONE DETECTED 01/07/2013 2114     Micro Results: Recent Results (from the past 240 hour(s))  MRSA PCR Screening     Status: None   Collection Time: 06/27/15  2:28 PM  Result Value Ref Range Status   MRSA by PCR NEGATIVE NEGATIVE Final    Comment:        The GeneXpert MRSA Assay (FDA approved for NASAL specimens only), is one component of a comprehensive MRSA colonization surveillance program. It is not intended to diagnose MRSA infection nor to guide or monitor treatment for MRSA infections.    Studies/Results: No results found. Medications: I have reviewed the patient's current medications. Scheduled Meds: . alendronate  70 mg  Oral Weekly  . fluticasone  2 spray Each Nare Daily  . furosemide  80 mg Oral Daily  . insulin aspart  0-5 Units Subcutaneous QHS  . insulin aspart  0-9 Units Subcutaneous TID WC  . mometasone-formoterol  2 puff Inhalation BID  . nicotine  14 mg Transdermal Daily  . pantoprazole (PROTONIX) IV  40 mg Intravenous BID  . rosuvastatin  20 mg Oral QHS  . sertraline  100 mg Oral Daily   Followed by  . [START ON 07/02/2015] sertraline  50 mg Oral Daily  . tiotropium  18 mcg Inhalation Daily   Continuous Infusions:  PRN Meds:.albuterol, ALPRAZolam, HYDROcodone-acetaminophen, ondansetron (ZOFRAN) IV Assessment/Plan: Active Problems:   Hyperlipidemia   Major depression, chronic (HCC)   Anxiety   Gastroesophageal reflux disease   Type 2 diabetes mellitus with diabetic neuropathy (HCC)   Fibromyalgia   Chronic congestive heart  failure with left ventricular diastolic dysfunction (HCC)   Paroxysmal atrial fibrillation (HCC)   Obesity (BMI 30.0-34.9)   Acute GI bleeding  Symptomatic Anemia/Slow GI Bleed:  No longer having any dark stool. hgb stable after 3 units at 7.3. EGD was normal. Capsule study pending.  - appreciate GI assistance. May discharge if CBC remains stable. May get her home tomorrow after done with capsule study if cbc remains stable.  - transfuse if Hbg < 7 - follow PT/INR - PPI - maintain IV access  CHF: Echo from 08/2012 showing LVEF 55-60%. Lungs sounds are okay but does have some wheezes. She has been non-adherent to her Lasix and has some pitting edema of her lower extremities. She is also on potassium supplementation at home  -restarted lasix, diuresing well. -daily weights -strict I/Os - watch electrolytes  DMII - levemir 20 bid at home - will do levemir 15 daily here for now. - SSI- sensitive with HS  COPD: currently stable with no issues. Patient reports being on home oxygen. -continue Albuterol prn -continue Dulera BID -continue Spiriva daily   Atrial Fibrillation - telemetry - holding coumadin - monitor rate control  Depression -stop Buproprion 100 mg BID -begin tapering Sertraline 200 mg daily.  Will try 162m x 3 days, 563mx 3 days, then off -after tapering off Sertraline, begin Prozac  Tobacco use: currently smokes about 1 PPD -Nicotine patch   GERD -Protonix 40 mg IV Q12H  DVT ppx: SCDs  Diet: clears, NPO at MN  Code: FULL  Dispo: Disposition is deferred at this time, awaiting improvement of current medical problems.  Anticipated discharge in approximately 2-3 day(s).   The patient does have a current PCP (LOval LinseyMD) and [facility].  The patient does not have transportation limitations that hinder transportation to clinic appointments.  .Services Needed at time of discharge: Y = Yes, Blank =  No PT:   OT:   RN:   Equipment:   Other:     LOS: 3 days   TaDellia NimsMD 06/30/2015, 6:52 AM

## 2015-06-30 NOTE — Progress Notes (Signed)
At about 2200, equipment on patient's capsule endoscopy fell to the floor from the bedside table as she stood.  The device's lights have failed to work since this happened.  Have reached out to on-call person for capsule endo to seek advice.  Will continue to monitor patient.

## 2015-06-30 NOTE — Progress Notes (Signed)
[facility] PROGRESS NOTE Subjective patient without gross bleeding. Unfortunately the equipment to read the small bowel capsule was dropped on the floor and is nonfunctional so the procedure has to be repeated. She has swallowed the capsule this morning already.  Objective: Vital signs in last 24 hours: Temp:  [97.8 F (36.6 C)-98.6 F (37 C)] 98.5 F (36.9 C) (05/20 0334) Pulse Rate:  [73-90] 80 (05/20 0334) Resp:  [4-21] 21 (05/20 0334) BP: (81-162)/(46-88) 132/53 mmHg (05/20 0334) SpO2:  [69 %-100 %] 97 % (05/20 0334) Weight:  [85.639 kg (188 lb 12.8 oz)] 85.639 kg (188 lb 12.8 oz) (05/20 0334) Last BM Date:  (patient stated last bm 10 days ago)  Intake/Output from previous day: 05/19 0701 - 05/20 0700 In: 1520 [P.O.:1420; I.V.:100] Out: 2900 [Urine:2900] Intake/Output this shift:      Lab Results:  Recent Labs  06/28/15 0344 06/28/15 1138 06/28/15 2320 06/29/15 1126 06/30/15 0256  WBC 8.5 9.2 7.4 6.8 7.2  HGB 7.0* 7.2* 7.2* 7.2* 7.3*  HCT 23.0* 23.8* 24.4* 24.1* 24.5*  PLT 259 254 256 247 237   BMET  Recent Labs  06/27/15 1139 06/29/15 0439  NA 136 140  K 4.4 4.1  CL 106 108  CO2 21* 26  CREATININE 0.94 0.86   LFT  Recent Labs  06/28/15 0241  PROT 5.6*  AST 79*  ALT 72*  ALKPHOS 84  BILITOT 1.5*  BILIDIR 0.4  IBILI 1.1*   PT/INR  Recent Labs  06/27/15 1139 06/28/15 0241 06/29/15 0439  LABPROT 36.2* 18.2* 17.3*  INR 3.76* 1.50* 1.41   PANCREAS No results for input(s): LIPASE in the last 72 hours.       Studies/Results: No results found.  Medications: I have reviewed the patient's current medications.  Assessment/Plan: 1. Chronic G.I. bleeding. She does have positive stools and anemia and is on Coumadin for history of atrial fibrillation and cardiac disease. EGD yesterday by Dr. Watt Climes was negative. 3 or 4 years ago she had a colonoscopy by Dr. Michail Sermon negative of the very small polyp. Capsule endoscopy is to be  done today. If her hemoglobin remain stable she could probably be discharged and follow-up with [name] her regular gastroenterologists. EDWARDS JR,JAMES L 06/30/2015, 9:41 AM  This note was created using voice recognition software. Minor errors may Have occurred unintentionally.  Pager: [phone] If no answer or after hours call [phone]

## 2015-07-01 DIAGNOSIS — Z7901 Long term (current) use of anticoagulants: Secondary | ICD-10-CM

## 2015-07-01 DIAGNOSIS — E119 Type 2 diabetes mellitus without complications: Secondary | ICD-10-CM | POA: Diagnosis not present

## 2015-07-01 DIAGNOSIS — I509 Heart failure, unspecified: Secondary | ICD-10-CM | POA: Diagnosis not present

## 2015-07-01 DIAGNOSIS — D509 Iron deficiency anemia, unspecified: Secondary | ICD-10-CM | POA: Diagnosis not present

## 2015-07-01 DIAGNOSIS — Z954 Presence of other heart-valve replacement: Secondary | ICD-10-CM

## 2015-07-01 DIAGNOSIS — I480 Paroxysmal atrial fibrillation: Secondary | ICD-10-CM

## 2015-07-01 DIAGNOSIS — D649 Anemia, unspecified: Secondary | ICD-10-CM | POA: Diagnosis not present

## 2015-07-01 DIAGNOSIS — K922 Gastrointestinal hemorrhage, unspecified: Secondary | ICD-10-CM | POA: Diagnosis not present

## 2015-07-01 LAB — BASIC METABOLIC PANEL
Anion gap: 8 (ref 5–15)
BUN: 6 mg/dL (ref 6–20)
CO2: 23 mmol/L (ref 22–32)
Calcium: 8.5 mg/dL — ABNORMAL LOW (ref 8.9–10.3)
Chloride: 108 mmol/L (ref 101–111)
Creatinine, Ser: 0.88 mg/dL (ref 0.44–1.00)
GFR calc Af Amer: >60 mL/min (ref >60)
GFR calc non Af Amer: >60 mL/min (ref >60)
Glucose, Bld: 187 mg/dL — ABNORMAL HIGH (ref 65–99)
Potassium: 3.8 mmol/L (ref 3.5–5.1)
Sodium: 139 mmol/L (ref 135–145)

## 2015-07-01 LAB — CBC
HCT: 27 % — ABNORMAL LOW (ref 36.0–46.0)
Hemoglobin: 7.9 g/dL — ABNORMAL LOW (ref 12.0–15.0)
MCH: 23.3 pg — ABNORMAL LOW (ref 26.0–34.0)
MCHC: 29.3 g/dL — ABNORMAL LOW (ref 30.0–36.0)
MCV: 79.6 fL (ref 78.0–100.0)
Platelets: 237 10*3/uL (ref 150–400)
RBC: 3.39 MIL/uL — ABNORMAL LOW (ref 3.87–5.11)
RDW: 22 % — ABNORMAL HIGH (ref 11.5–15.5)
WBC: 8.5 10*3/uL (ref 4.0–10.5)

## 2015-07-01 LAB — GLUCOSE, CAPILLARY
Glucose-Capillary: 173 mg/dL — ABNORMAL HIGH (ref 65–99)
Glucose-Capillary: 213 mg/dL — ABNORMAL HIGH (ref 65–99)
Glucose-Capillary: 165 mg/dL — ABNORMAL HIGH (ref 65–99)
Glucose-Capillary: 180 mg/dL — ABNORMAL HIGH (ref 65–99)

## 2015-07-01 NOTE — Progress Notes (Signed)
Subjective: Patient seen and examined.  Capsule endoscopy has been repeated.  Patient states she is exhausted and cannot sleep but otherwise has no new complaints.  Has not had any bleeding.  Objective: Vital signs in last 24 hours: Filed Vitals:   06/30/15 0334 06/30/15 1201 06/30/15 2036 07/01/15 0451  BP: 132/53 135/46 109/55 158/53  Pulse: 80 81 75 78  Temp: 98.5 F (36.9 C) 98.6 F (37 C) 97.8 F (36.6 C) 97.8 F (36.6 C)  TempSrc: Oral Oral Oral Oral  Resp: _0 Height: _1  (1.575 m)     Weight: 188 lb 12.8 oz (85.639 kg)   188 lb 4.8 oz (85.412 kg)  SpO2: 97% 98% 95% 99%   Weight change: -8 oz (-0.227 kg)  Intake/Output Summary (Last 24 hours) at 07/01/15 0845 Last data filed at 07/01/15 0200  Gross per 24 hour  Intake    340 ml  Output   2100 ml  Net  -1760 ml   General: sitting up in bed eating breakfast, chronically ill-appearing, no distress HEENT: EOMI Cardiac: distant heart sounds, RRR Pulm: normal effort Abd: soft, nontender, nondistended Ext: warm and well perfused, 1+ pedal edema Neuro: alert and oriented X3, cranial nerves II-XII grossly intact  Lab Results: Basic Metabolic Panel:  Recent Labs Lab 06/29/15 0439 07/01/15 0455  NA 140 139  K 4.1 3.8  CL 108 108  CO2 26 23  GLUCOSE 157* 187*  BUN 6 6  CREATININE 0.86 0.88  CALCIUM 8.3* 8.5*   Liver Function Tests:  Recent Labs Lab 06/28/15 0241  AST 79*  ALT 72*  ALKPHOS 84  BILITOT 1.5*  PROT 5.6*  ALBUMIN 2.7*   CBC:  Recent Labs Lab 06/27/15 1139  06/30/15 0256 07/01/15 0455  WBC 9.0  < > 7.2 8.5  NEUTROABS 6.9  --   --   --   HGB 3.6*  < > 7.3* 7.9*  HCT 13.5*  < > 24.5* 27.0*  MCV 68.2*  < > 76.1* 79.6  PLT 329  < > 237 237  < > = values in this interval not displayed. Cardiac Enzymes:  Recent Labs Lab 06/27/15 1429 06/28/15 0241  TROPONINI <0.03 <0.03   CBG:  Recent Labs Lab 06/29/15 2148 06/30/15 0604 06/30/15 1137 06/30/15 1624  06/30/15 2131 07/01/15 0630  GLUCAP 268* 224* 135* 178* 174* 173*   Coagulation:  Recent Labs Lab 06/27/15 1139 06/28/15 0241 06/29/15 0439  LABPROT 36.2* 18.2* 17.3*  INR 3.76* 1.50* 1.41   Anemia Panel:  Recent Labs Lab 06/28/15 0241  RETICCTPCT 1.8   Urine Drug Screen: Drugs of Abuse     Component Value Date/Time   LABOPIA POSITIVE* 01/07/2013 2114   COCAINSCRNUR NONE DETECTED 01/07/2013 2114   LABBENZ NONE DETECTED 01/07/2013 2114   AMPHETMU NONE DETECTED 01/07/2013 2114   THCU NONE DETECTED 01/07/2013 2114   LABBARB NONE DETECTED 01/07/2013 2114     Micro Results: Recent Results (from the past 240 hour(s))  MRSA PCR Screening     Status: None   Collection Time: 06/27/15  2:28 PM  Result Value Ref Range Status   MRSA by PCR NEGATIVE NEGATIVE Final    Comment:        The GeneXpert MRSA Assay (FDA approved for NASAL specimens only), is one component of a comprehensive MRSA colonization surveillance program. It is not intended to diagnose MRSA infection nor to guide or monitor treatment for MRSA infections.    Studies/Results: No  results found. Medications: I have reviewed the patient's current medications. Scheduled Meds: . alendronate  70 mg Oral Weekly  . fluticasone  2 spray Each Nare Daily  . furosemide  80 mg Oral Daily  . insulin aspart  0-5 Units Subcutaneous QHS  . insulin aspart  0-9 Units Subcutaneous TID WC  . insulin detemir  15 Units Subcutaneous Daily  . mometasone-formoterol  2 puff Inhalation BID  . nicotine  14 mg Transdermal Daily  . pantoprazole (PROTONIX) IV  40 mg Intravenous BID  . rosuvastatin  20 mg Oral QHS  . sertraline  100 mg Oral Daily   Followed by  . [START ON 07/02/2015] sertraline  50 mg Oral Daily  . tiotropium  18 mcg Inhalation Daily   Continuous Infusions:  PRN Meds:.albuterol, ALPRAZolam, HYDROcodone-acetaminophen, ondansetron (ZOFRAN) IV Assessment/Plan: Active Problems:   Hyperlipidemia   Major  depression, chronic (HCC)   Anxiety   Gastroesophageal reflux disease   Type 2 diabetes mellitus with diabetic neuropathy (HCC)   Fibromyalgia   Chronic congestive heart failure with left ventricular diastolic dysfunction (HCC)   Paroxysmal atrial fibrillation (HCC)   Obesity (BMI 30.0-34.9)   Acute GI bleeding  Symptomatic Anemia/Slow GI Bleed:  She has not had any further dark stool.  Thus far, she has had a negative work-up.  Capsule endoscopy is pending and will be read tomorrow.  Her hemoglobin today is 7.9. - appreciate GI assistance. Will plan to keep her overnight and have capsule study read.  Disposition will be determined based on results - transfuse if Hbg < 7 - follow PT/INR - PPI - maintain IV access  CHF: Echo from 08/2012 showing LVEF 55-60%. Lungs sounds are okay but does have some wheezes. She has been non-adherent to her Lasix and has some pitting edema of her lower extremities. She is also on potassium supplementation at home  -continuing lasix, diuresing well. -daily weights -strict I/Os -watch electrolytes  DMII - levemir 20 bid at home - will do levemir 15 daily here for now. - SSI- sensitive with HS  COPD: currently stable with no issues. Patient reports being on home oxygen. -continue Albuterol prn -continue Dulera BID -continue Spiriva daily   Atrial Fibrillation - telemetry - holding coumadin - monitor rate control  Depression -stop Buproprion 100 mg BID -continue tapering Sertraline 200 mg daily.  Will try 190m x 3 days, 557mx 3 days, then off.  She has 1 more day of 10070m-after tapering off Sertraline, begin Prozac  Tobacco use: currently smokes about 1 PPD -Nicotine patch   GERD -Protonix 40 mg IV Q12H  DVT ppx: SCDs  Diet: carb modified  Code: FULL  Dispo: Disposition is deferred at this time, awaiting improvement of current medical problems.  Anticipated discharge in approximately 1-2 day(s).   The patient does have a current  PCP (LaOval LinseyD) and [facility].  The patient does not have transportation limitations that hinder transportation to clinic appointments.  .Services Needed at time of discharge: Y = Yes, Blank = No PT:   OT:   RN:   Equipment:   Other:     LOS: 4 days   AndJule SerO 07/01/2015, 8:45 AM

## 2015-07-01 NOTE — Progress Notes (Signed)
[facility] PROGRESS NOTE Subjective no gross bleeding. Hemoglobin up to 7.9. Capsule endoscopy repeated. Will be read tomorrow. Patient has no complaints  Objective: Vital signs in last 24 hours: Temp:  [97.8 F (36.6 C)-98.6 F (37 C)] 97.8 F (36.6 C) (05/21 0451) Pulse Rate:  [75-81] 78 (05/21 0451) Resp:  [18] 18 (05/21 0451) BP: (109-158)/(46-55) 158/53 mmHg (05/21 0451) SpO2:  [95 %-99 %] 99 % (05/21 0451) Weight:  [85.412 kg (188 lb 4.8 oz)] 85.412 kg (188 lb 4.8 oz) (05/21 0451) Last BM Date:  (patient stated last bm 10 days ago)  Intake/Output from previous day: 05/20 0701 - 05/21 0700 In: 340 [P.O.:340] Out: 2100 [Urine:2100] Intake/Output this shift:    PE: General-- in bed watching TV no complaints  Abdomen-- soft and nontender  Lab Results:  Recent Labs  06/28/15 1138 06/28/15 2320 06/29/15 1126 06/30/15 0256 07/01/15 0455  WBC 9.2 7.4 6.8 7.2 8.5  HGB 7.2* 7.2* 7.2* 7.3* 7.9*  HCT 23.8* 24.4* 24.1* 24.5* 27.0*  PLT 254 256 247 237 237   BMET  Recent Labs  06/29/15 0439 07/01/15 0455  NA 140 139  K 4.1 3.8  CL 108 108  CO2 26 23  CREATININE 0.86 0.88   LFT No results for input(s): PROT, AST, ALT, ALKPHOS, BILITOT, BILIDIR, IBILI in the last 72 hours. PT/INR  Recent Labs  06/29/15 0439  LABPROT 17.3*  INR 1.41   PANCREAS No results for input(s): LIPASE in the last 72 hours.       Studies/Results: No results found.  Medications: I have reviewed the patient's current medications.  Assessment/Plan: 1. Chronic intermittent G.I. bleeding with negative workup. Small bowel capsule pending. Patient has high risk atrial fibrillation and is felt that she needs to be chronically anticoagulated. She has had a previous splenic infarct. The capsule endoscopy will be read tomorrow.   [name] JR,Telesha Deguzman L 07/01/2015, 8:27 AM  This note was created using voice recognition software. Minor errors may Have occurred  unintentionally.  Pager: [phone] If no answer or after hours call [phone]

## 2015-07-01 NOTE — Progress Notes (Signed)
Removed CPAP from patient at patient's request, she was unable to tolerate the noise any longer, along with anxiety issues. RT will continue to monitor as needed.

## 2015-07-02 ENCOUNTER — Encounter (HOSPITAL_COMMUNITY): Payer: Self-pay | Admitting: Gastroenterology

## 2015-07-02 DIAGNOSIS — K5900 Constipation, unspecified: Secondary | ICD-10-CM

## 2015-07-02 DIAGNOSIS — I808 Phlebitis and thrombophlebitis of other sites: Secondary | ICD-10-CM

## 2015-07-02 DIAGNOSIS — K558 Other vascular disorders of intestine: Secondary | ICD-10-CM | POA: Diagnosis not present

## 2015-07-02 DIAGNOSIS — D509 Iron deficiency anemia, unspecified: Secondary | ICD-10-CM | POA: Diagnosis not present

## 2015-07-02 LAB — CBC
HCT: 28.3 % — ABNORMAL LOW (ref 36.0–46.0)
Hemoglobin: 8.1 g/dL — ABNORMAL LOW (ref 12.0–15.0)
MCH: 22.9 pg — ABNORMAL LOW (ref 26.0–34.0)
MCHC: 28.6 g/dL — ABNORMAL LOW (ref 30.0–36.0)
MCV: 80.2 fL (ref 78.0–100.0)
Platelets: 226 10*3/uL (ref 150–400)
RBC: 3.53 MIL/uL — ABNORMAL LOW (ref 3.87–5.11)
RDW: 24 % — ABNORMAL HIGH (ref 11.5–15.5)
WBC: 9.2 10*3/uL (ref 4.0–10.5)

## 2015-07-02 LAB — GLUCOSE, CAPILLARY
Glucose-Capillary: 223 mg/dL — ABNORMAL HIGH (ref 65–99)
Glucose-Capillary: 210 mg/dL — ABNORMAL HIGH (ref 65–99)
Glucose-Capillary: 145 mg/dL — ABNORMAL HIGH (ref 65–99)
Glucose-Capillary: 227 mg/dL — ABNORMAL HIGH (ref 65–99)

## 2015-07-02 MED ORDER — MAGNESIUM HYDROXIDE 400 MG/5ML PO SUSP
30.0000 mL | Freq: Two times a day (BID) | ORAL | Status: DC
  Administered 2015-07-02 (×2): 30 mL via ORAL
  Filled 2015-07-02 (×2): qty 30

## 2015-07-02 MED ORDER — HYDROCODONE-ACETAMINOPHEN 5-325 MG PO TABS: 1.0000 | ORAL_TABLET | Freq: Once | ORAL | Status: DC

## 2015-07-02 MED ORDER — WARFARIN - PHARMACIST DOSING INPATIENT: Freq: Every day | Status: DC

## 2015-07-02 MED ORDER — POLYETHYLENE GLYCOL 3350 17 G PO PACK: 17.0000 g | PACK | Freq: Every day | ORAL | Status: DC

## 2015-07-02 MED ORDER — INSULIN DETEMIR 100 UNIT/ML ~~LOC~~ SOLN
20.0000 [IU] | Freq: Every day | SUBCUTANEOUS | Status: DC
  Administered 2015-07-03: 20 [IU] via SUBCUTANEOUS
  Filled 2015-07-02: qty 0.2

## 2015-07-02 MED ORDER — WARFARIN SODIUM 5 MG PO TABS
5.0000 mg | ORAL_TABLET | Freq: Once | ORAL | Status: AC
  Administered 2015-07-02: 5 mg via ORAL
  Filled 2015-07-02: qty 1

## 2015-07-02 NOTE — Consult Note (Signed)
[facility] Inpatient Consult   07/02/2015  [name] 07-Apr-1950 [id number]    Patient is previously active with [facility] for chronic disease management services as a benefit of her Health Team Advantage plan.  Patient endorses being active with the Internal Medicine clinic.   Patient has been engaged by a [employer], CSW and pharmacist.  Met with the patient at bedside and spoke with her at length in regards to the services with [facility].  Patient had specific complaints and states she currently did not wish to receive post hospital follow up from [facility].  This Probation officer did speak and share with [facility] leadership regarding her concerns. Patient did accept a brochure and contact card if she changes her mind regarding services. Notes reviewed for services that were in progress.  Patient felt she will seek out other resources, at this time.   For additional questions or referrals please contact:  Natividad Brood, RN BSN [facility] Liaison  [phone] business mobile phone Toll free office [phone]

## 2015-07-02 NOTE — Progress Notes (Signed)
Subjective: Patient seen and examined.  No acute events overnight.  Awaiting results of capsule study.  Reports no BM since admission.  No further bleeding noted.  RN reports patient complaining of pain and swelling at old IV site but patient did not raise this concern on rounds.  Will plan to evaluate again this afternoon.  Objective: Vital signs in last 24 hours: Filed Vitals:   07/01/15 2013 07/01/15 2031 07/02/15 0622 07/02/15 1204  BP:  136/69 116/60 104/71  Pulse:  74 77 85  Temp:  98.2 F (36.8 C) 98.2 F (36.8 C) 97.5 F (36.4 C)  TempSrc:  Oral Oral Tympanic  Resp:  _0 Height:      Weight:   184 lb 1.6 oz (83.507 kg)   SpO2: 98% 97% 96% 93%   Weight change: -4 lb 3.2 oz (-1.905 kg)  Intake/Output Summary (Last 24 hours) at 07/02/15 1334 Last data filed at 07/02/15 1203  Gross per 24 hour  Intake   1614 ml  Output   6050 ml  Net  -4436 ml   General: sitting up in bed, chronically ill-appearing, no distress HEENT: EOMI Cardiac: distant heart sounds, RRR Pulm: normal effort, CTAB Abd: soft, nontender, nondistended Ext: warm and well perfused, 1+ pedal edema Neuro: alert and oriented X3, cranial nerves II-XII grossly intact  Lab Results: Basic Metabolic Panel:  Recent Labs Lab 06/29/15 0439 07/01/15 0455  NA 140 139  K 4.1 3.8  CL 108 108  CO2 26 23  GLUCOSE 157* 187*  BUN 6 6  CREATININE 0.86 0.88  CALCIUM 8.3* 8.5*   Liver Function Tests:  Recent Labs Lab 06/28/15 0241  AST 79*  ALT 72*  ALKPHOS 84  BILITOT 1.5*  PROT 5.6*  ALBUMIN 2.7*   CBC:  Recent Labs Lab 06/27/15 1139  07/01/15 0455 07/02/15 0237  WBC 9.0  < > 8.5 9.2  NEUTROABS 6.9  --   --   --   HGB 3.6*  < > 7.9* 8.1*  HCT 13.5*  < > 27.0* 28.3*  MCV 68.2*  < > 79.6 80.2  PLT 329  < > 237 226  < > = values in this interval not displayed. Cardiac Enzymes:  Recent Labs Lab 06/27/15 1429 06/28/15 0241  TROPONINI <0.03 <0.03   CBG:  Recent Labs Lab  07/01/15 0630 07/01/15 1200 07/01/15 1633 07/01/15 2059 07/02/15 0706 07/02/15 1202  GLUCAP 173* 213* 165* 180* 223* 210*   Coagulation:  Recent Labs Lab 06/27/15 1139 06/28/15 0241 06/29/15 0439  LABPROT 36.2* 18.2* 17.3*  INR 3.76* 1.50* 1.41   Anemia Panel:  Recent Labs Lab 06/28/15 0241  RETICCTPCT 1.8   Urine Drug Screen: Drugs of Abuse     Component Value Date/Time   LABOPIA POSITIVE* 01/07/2013 2114   COCAINSCRNUR NONE DETECTED 01/07/2013 2114   LABBENZ NONE DETECTED 01/07/2013 2114   AMPHETMU NONE DETECTED 01/07/2013 2114   THCU NONE DETECTED 01/07/2013 2114   LABBARB NONE DETECTED 01/07/2013 2114     Micro Results: Recent Results (from the past 240 hour(s))  MRSA PCR Screening     Status: None   Collection Time: 06/27/15  2:28 PM  Result Value Ref Range Status   MRSA by PCR NEGATIVE NEGATIVE Final    Comment:        The GeneXpert MRSA Assay (FDA approved for NASAL specimens only), is one component of a comprehensive MRSA colonization surveillance program. It is not intended to diagnose MRSA infection  nor to guide or monitor treatment for MRSA infections.    Studies/Results: No results found. Medications: I have reviewed the patient's current medications. Scheduled Meds: . fluticasone  2 spray Each Nare Daily  . furosemide  80 mg Oral Daily  . insulin aspart  0-5 Units Subcutaneous QHS  . insulin aspart  0-9 Units Subcutaneous TID WC  . [START ON 07/03/2015] insulin detemir  20 Units Subcutaneous Daily  . mometasone-formoterol  2 puff Inhalation BID  . nicotine  14 mg Transdermal Daily  . pantoprazole (PROTONIX) IV  40 mg Intravenous BID  . rosuvastatin  20 mg Oral QHS  . sertraline  50 mg Oral Daily  . tiotropium  18 mcg Inhalation Daily   Continuous Infusions:  PRN Meds:.albuterol, ALPRAZolam, HYDROcodone-acetaminophen, ondansetron (ZOFRAN) IV Assessment/Plan: Active Problems:   Hyperlipidemia   Major depression, chronic (HCC)    Anxiety   Gastroesophageal reflux disease   Type 2 diabetes mellitus with diabetic neuropathy (HCC)   Fibromyalgia   Chronic congestive heart failure with left ventricular diastolic dysfunction (HCC)   Paroxysmal atrial fibrillation (HCC)   Obesity (BMI 30.0-34.9)   Acute GI bleeding  Symptomatic Anemia/Slow GI Bleed:  She has not had any further dark stool.  Thus far, she has had a negative work-up.  Capsule endoscopy is pending and should be read today.  Her hemoglobin today is 8.1. - appreciate GI assistance. Keeping her until capsule study has been read.  Disposition will be determined based on results - transfuse if Hbg < 7 - PPI - anti-coagulation as below  Constipation:  - will try miralax daily  CHF: Echo from 08/2012 showing LVEF 55-60% -continuing lasix -daily weights -strict I/Os -watch electrolytes Q48H  DMII - levemir 20 bid at home - Levemir 20units daily - SSI- sensitive with HS  COPD: currently stable with no issues. Patient reports being on home oxygen. -continue Albuterol prn -continue Dulera BID -continue Spiriva daily   Atrial Fibrillation - telemetry - holding coumadin pending GI work-up.  Will restart if work-up is negative and no further intervention needed from GI standpoint - monitor rate control  Depression -stopped Buproprion 100 mg BID -continue tapering Sertraline 200 mg daily with 179m x 3 days, 551mx 3 days, then off.  She is now on 5065mafter tapering off Sertraline, begin Prozac  Tobacco use: currently smokes about 1 PPD -Nicotine patch   GERD -Protonix 40 mg IV Q12H  DVT ppx: SCDs  Diet: carb modified  Code: FULL  Dispo: Pending results of capsule study.  The patient does have a current PCP (La[facility] LinseyD) and [facility].  The patient does not have transportation limitations that hinder transportation to clinic appointments.  .Services Needed at time of discharge:  Y = Yes, Blank = No PT:   OT:   RN:   Equipment:   Other:     LOS: 5 days   AndJule SerO 07/02/2015, 1:34 PM

## 2015-07-02 NOTE — Progress Notes (Signed)
ANTICOAGULATION CONSULT NOTE - Initial Consult  Pharmacy Consult:  Coumadin Indication: atrial fibrillation  Allergies  Allergen Reactions  . Morphine And Related Other (See Comments)  . Oxycontin [Oxycodone] Other (See Comments)    headache  . Tramadol Hcl     REACTION: swelling  . Lorazepam Other (See Comments)    Patient's sister noted that ativan caused the patient to become extremely confused during hospitalization 09/2010; tolerates Xanax    Patient Measurements: Height: _0  (157.5 cm) Weight: 184 lb 1.6 oz (83.507 kg) IBW/kg (Calculated) : 50.1  Vital Signs: Temp: 97.5 F (36.4 C) (05/22 1204) Temp Source: Tympanic (05/22 1204) BP: 104/71 mmHg (05/22 1204) Pulse Rate: 85 (05/22 1204)  Labs:  Recent Labs  06/30/15 0256 07/01/15 0455 07/02/15 0237  HGB 7.3* 7.9* 8.1*  HCT 24.5* 27.0* 28.3*  PLT 237 237 226  CREATININE  --  0.88  --     Estimated Creatinine Clearance: 63 mL/min (by C-G formula based on Cr of 0.88).   Medical History: Past Medical History  Diagnosis Date  . COPD (chronic obstructive pulmonary disease) with emphysema (HCC)     PFTs 02/2012: FEV1 0.92 (40%), ratio 69, 27% increase in FEV1 with BD, TLC 91%, severe airtrapping, DLCO49% On chronic home O2. Pulmonary rehab referral 05/2012   . Moderate to severe pulmonary hypertension ([place])     2014 TEE w PA peak pressure 46 mmHg, s/p MV replacement   . Tobacco abuse 07/28/2012  . Hyperlipidemia LDL goal < 100 11/20/2005  . Carotid artery stenosis     s/p right endarterectomy (06/2010) Carotid US (07/2010):  Left: Moderate-to-severe (60-79%) calcific and non-calcific plaque origin and proximal ICA and ECA   . Mitral stenosis     s/p Mitral valve replacement with a 27-mm pericardial porcine valve (Medtronic Mosaic valve, serial #[id number] on 09/20/10, Dr. Prescott Gum)   . Chronic congestive heart failure with left ventricular diastolic dysfunction ([place]) 10/21/2010  . Depression 11/19/2005  . Anxiety  07/24/2010  . Fibromyalgia 08/29/2010  . Obesity (BMI 30.0-34.9) 10/23/2011  . Adenomatous polyps 05/14/2011    Colonoscopy (05/2011): 4 mm adenomatous polyp excised endoscopically Colonoscopy (02/2002): Adenomatous polyp excised endoscopically   . Gastroesophageal reflux disease   . Internal hemorrhoids 08/04/2012  . Osteoporosis     DEXA (12/09/2011): L-spine T -3.7, left hip T -1.4 DEXA (12/2004): L-spine T -2.6, left hip -0.1   . Allergic rhinitis 06/01/2012  . Bilateral cataracts 08/04/2012    Visually insignificant   . Chronic constipation 02/03/2011  . Chronic venous insufficiency 08/04/2012  . Paroxysmal atrial fibrillation ([place]) 10/22/2010    s/p Left atrial maze procedure for paroxysmal atrial fibrillation on 09/20/2010 by Dr Prescott Gum.  Subsequent splenic infarct, decision was made to re-anticoagulate with coumadin, likely life-long as this is the most likely cause of the splenic infarct.   . Chronic low back pain 10/06/2012  . Anemia of chronic disease 01/01/2013  . Obstructive sleep apnea     Nocturnal polysomnography (06/2009): Moderate sleep apnea/ hypopnea syndrome , AHI 17.8 per hour with nonpositional hypopneas. CPAP titration to 12 CWP, AHI 2.4 per hour. On nocturnal CPAP via a small resMed Quattro full-face mask with heated humidifier.   . Type 2 diabetes mellitus with diabetic neuropathy ([place])   . History of blood transfusion     "several times"   . Clear cell renal cell carcinoma ([place]) 07/21/2011    s/p cryoablation of left RCC in 09/2011 by [name]. Followed by [name]  (  [facility]) .    Marland Kitchen Chronic daily headache 01/16/2014  . Right nephrolithiasis 09/06/2014    5 mm non-obstructing calculus seen on CT scan 09/05/2014   . Aortic atherosclerosis ([place]) 10/19/2014    Seen on CT scan, currently asymptomatic  . Shortness of breath dyspnea       Assessment: 65 YOF on Coumadin PTA for history of Afib presented with severe anemia and received Vitamin K along with  KCentra on 06/27/15.  She had obscure GI bleeding and endoscopy showed non-bleeding small bowel erosions, gastritis and non-bleeding AVM.  Pharmacy now consulted to resume Coumadin.  Last INR was sub-therapeutic at 1.41.   Goal of Therapy:  INR 2-3, aim for 2-2.5    Plan:  - Coumadin 28m PO today - Daily PT / INR   Thuy D. DMina Marble PharmD, BCPS Pager:  [phone]/22/2017, 3:56 PM

## 2015-07-02 NOTE — Progress Notes (Signed)
Patient ID: [name], female   DOB: [birth year], 65 y.o.   MRN: [id number] [facility] Gastroenterology Progress Note  [name] 65 y.o. 06/24/1950   Subjective: Sitting on side of bed. No complaints. Reports no BM in 1-1.5 weeks.  Objective: Vital signs in last 24 hours: Filed Vitals:   07/02/15 0622 07/02/15 1204  BP: 116/60 104/71  Pulse: 77 85  Temp: 98.2 F (36.8 C) 97.5 F (36.4 C)  Resp: 19 20    Physical Exam: Gen: alert, no acute distress, obese HEENT: anicteric sclera CV: RRR Chest: CTA B Abd: right-sided tenderness with guarding, soft, nondistended, +BS Ext: 1+ LE edema  Lab Results:  Recent Labs  07/01/15 0455  NA 139  K 3.8  CL 108  CO2 23  GLUCOSE 187*  BUN 6  CREATININE 0.88  CALCIUM 8.5*   No results for input(s): AST, ALT, ALKPHOS, BILITOT, PROT, ALBUMIN in the last 72 hours.  Recent Labs  07/01/15 0455 07/02/15 0237  WBC 8.5 9.2  HGB 7.9* 8.1*  HCT 27.0* 28.3*  MCV 79.6 80.2  PLT 237 226   No results for input(s): LABPROT, INR in the last 72 hours.    Assessment/Plan: 65 yo with anemia, obscure GI bleeding and capsule endoscopy showed a few nonbleeding small bowel erosions, mild gastritis, and a small nonbleeding AVM (complete procedure report to be scanned in). Ok to resume anticoagulation (defer to primary team). Start a laxative (pt reports that Miralax makes her nauseous and she prefers Milk of Mg). Supportive care. No further GI workup recommended. Will sign off. Call if questions.   Addison C. 07/02/2015, 1:50 PM  Pager [phone]  If no answer or after 5 PM call [phone]

## 2015-07-02 NOTE — Progress Notes (Signed)
Inpatient Diabetes Program Recommendations  AACE/ADA: New Consensus Statement on Inpatient Glycemic Control (2015)  Target Ranges:  Prepandial:   less than 140 mg/dL      Peak postprandial:   less than 180 mg/dL (1-2 hours)      Critically ill patients:  140 - 180 mg/dL   Results for [name], [name] (MRN [id number]) as of 07/02/2015 10:23  Ref. Range 07/01/2015 06:30 07/01/2015 12:00 07/01/2015 16:33 07/01/2015 20:59  Glucose-Capillary Latest Ref Range: 65-99 mg/dL 173 (H) 213 (H) 165 (H) 180 (H)   Results for [name], [name] (MRN [id number]) as of 07/02/2015 10:23  Ref. Range 07/02/2015 07:06  Glucose-Capillary Latest Ref Range: 65-99 mg/dL 223 (H)    Home DM Meds: Levemir 45 units bid  (patient not taking per home med records)       Novolog 18 units tidwc  (patient not taking per home med records)  Current Insulin Orders: Levemir 15 units daily      Novolog Sensitive Correction Scale/ SSI (0-9 units) TID AC + HS       MD- Please consider the following in-hospital insulin adjustments:  1. Increase Levemir to 20 units daily  2. Start low dose Novolog meal coverage- Novolog 3 units tid with meals (patient eating 100% of meals at this time)      --Will follow patient during hospitalization--  Wyn Quaker RN, MSN, CDE Diabetes Coordinator Inpatient Glycemic Control Team Team Pager: [phone] (8a-5p)

## 2015-07-02 NOTE — Progress Notes (Addendum)
  Date: 07/02/2015  Patient name: [name]  Medical record number: [id number]  Date of birth: 21-Oct-1950   This patient has been seen and the plan of care was discussed with the house staff. Please see Dr. Alcario Drought note for complete details. I concur with his findings with the following additions/corrections:   Reviewed GI note.  Recommended adding laxative and further supportive care.  No further work up for GI bleeding necessary.  She does have reported thrombophlebitis at site of previous IV and we are transitioning her from Zoloft to Prozac.  She may be able to be discharged today if appropriate outpatient follow up and planning can be accomplished.   Sid Falcon, MD 07/02/2015, 2:13 PM

## 2015-07-02 NOTE — OR Nursing (Signed)
Charges for givens from fri 5/19 deleted due to recorder being dropped and breaking

## 2015-07-03 DIAGNOSIS — D509 Iron deficiency anemia, unspecified: Secondary | ICD-10-CM

## 2015-07-03 LAB — BASIC METABOLIC PANEL
Anion gap: 8 (ref 5–15)
BUN: 8 mg/dL (ref 6–20)
CO2: 32 mmol/L (ref 22–32)
Calcium: 8.6 mg/dL — ABNORMAL LOW (ref 8.9–10.3)
Chloride: 98 mmol/L — ABNORMAL LOW (ref 101–111)
Creatinine, Ser: 0.83 mg/dL (ref 0.44–1.00)
GFR calc Af Amer: >60 mL/min (ref >60)
GFR calc non Af Amer: >60 mL/min (ref >60)
Glucose, Bld: 230 mg/dL — ABNORMAL HIGH (ref 65–99)
Potassium: 3.1 mmol/L — ABNORMAL LOW (ref 3.5–5.1)
Sodium: 138 mmol/L (ref 135–145)

## 2015-07-03 LAB — CBC
HCT: 28.5 % — ABNORMAL LOW (ref 36.0–46.0)
Hemoglobin: 8.3 g/dL — ABNORMAL LOW (ref 12.0–15.0)
MCH: 23.6 pg — ABNORMAL LOW (ref 26.0–34.0)
MCHC: 29.1 g/dL — ABNORMAL LOW (ref 30.0–36.0)
MCV: 81 fL (ref 78.0–100.0)
Platelets: 230 10*3/uL (ref 150–400)
RBC: 3.52 MIL/uL — ABNORMAL LOW (ref 3.87–5.11)
RDW: 25.5 % — ABNORMAL HIGH (ref 11.5–15.5)
WBC: 9.6 10*3/uL (ref 4.0–10.5)

## 2015-07-03 LAB — PROTIME-INR
INR: 1.28 (ref 0.00–1.49)
Prothrombin Time: 16.1 seconds — ABNORMAL HIGH (ref 11.6–15.2)

## 2015-07-03 LAB — GLUCOSE, CAPILLARY
Glucose-Capillary: 189 mg/dL — ABNORMAL HIGH (ref 65–99)
Glucose-Capillary: 260 mg/dL — ABNORMAL HIGH (ref 65–99)

## 2015-07-03 MED ORDER — FLUOXETINE HCL 20 MG PO TABS: 20.0000 mg | ORAL_TABLET | Freq: Every day | ORAL | Status: DC

## 2015-07-03 MED ORDER — WARFARIN SODIUM 5 MG PO TABS: 5.0000 mg | ORAL_TABLET | Freq: Every day | ORAL | Status: DC

## 2015-07-03 MED ORDER — POTASSIUM CHLORIDE CRYS ER 20 MEQ PO TBCR
60.0000 meq | EXTENDED_RELEASE_TABLET | Freq: Once | ORAL | Status: AC
  Administered 2015-07-03: 60 meq via ORAL
  Filled 2015-07-03: qty 3

## 2015-07-03 MED ORDER — PANTOPRAZOLE SODIUM 40 MG PO TBEC: 40.0000 mg | DELAYED_RELEASE_TABLET | Freq: Two times a day (BID) | ORAL | Status: DC

## 2015-07-03 MED ORDER — FERROUS GLUCONATE 324 (38 FE) MG PO TABS: 324.0000 mg | ORAL_TABLET | Freq: Every day | ORAL | Status: DC

## 2015-07-03 MED ORDER — WARFARIN SODIUM 5 MG PO TABS: ORAL_TABLET | ORAL | Status: DC

## 2015-07-03 MED ORDER — MAGNESIUM CITRATE PO SOLN
1.0000 | Freq: Once | ORAL | Status: AC
  Administered 2015-07-03: 1 via ORAL
  Filled 2015-07-03: qty 296

## 2015-07-03 NOTE — Progress Notes (Signed)
CM talked to patient about DCP; patient lives at home with her granddaughter and her  (granddaug) boyfriend; Patient does not want any HHC services at this time. CM informed patient that if she changed her mind her PCP can make [place] arrangements from his office. Mindi Slicker [facility] [phone]

## 2015-07-03 NOTE — Progress Notes (Signed)
ANTICOAGULATION CONSULT NOTE  Pharmacy Consult:  Coumadin Indication: atrial fibrillation  Allergies  Allergen Reactions  . Morphine And Related Other (See Comments)  . Oxycontin [Oxycodone] Other (See Comments)    headache  . Tramadol Hcl     REACTION: swelling  . Lorazepam Other (See Comments)    Patient's sister noted that ativan caused the patient to become extremely confused during hospitalization 09/2010; tolerates Xanax    Patient Measurements: Height: _0  (157.5 cm) Weight: 182 lb (82.555 kg) IBW/kg (Calculated) : 50.1  Vital Signs: Temp: 98.6 F (37 C) (05/23 0321) Temp Source: Oral (05/23 0321) BP: 132/41 mmHg (05/23 0321) Pulse Rate: 84 (05/23 0321)  Labs:  Recent Labs  07/01/15 0455 07/02/15 0237 07/03/15 0258  HGB 7.9* 8.1* 8.3*  HCT 27.0* 28.3* 28.5*  PLT 237 226 230  LABPROT  --   --  16.1*  INR  --   --  1.28  CREATININE 0.88  --  0.83    Estimated Creatinine Clearance: 66.4 mL/min (by C-G formula based on Cr of 0.83).   Medical History: Past Medical History  Diagnosis Date  . COPD (chronic obstructive pulmonary disease) with emphysema (HCC)     PFTs 02/2012: FEV1 0.92 (40%), ratio 69, 27% increase in FEV1 with BD, TLC 91%, severe airtrapping, DLCO49% On chronic home O2. Pulmonary rehab referral 05/2012   . Moderate to severe pulmonary hypertension ([place])     2014 TEE w PA peak pressure 46 mmHg, s/p MV replacement   . Tobacco abuse 07/28/2012  . Hyperlipidemia LDL goal < 100 11/20/2005  . Carotid artery stenosis     s/p right endarterectomy (06/2010) Carotid US (07/2010):  Left: Moderate-to-severe (60-79%) calcific and non-calcific plaque origin and proximal ICA and ECA   . Mitral stenosis     s/p Mitral valve replacement with a 27-mm pericardial porcine valve (Medtronic Mosaic valve, serial #[id number] on 09/20/10, Dr. Prescott Gum)   . Chronic congestive heart failure with left ventricular diastolic dysfunction ([place]) 10/21/2010  . Depression  11/19/2005  . Anxiety 07/24/2010  . Fibromyalgia 08/29/2010  . Obesity (BMI 30.0-34.9) 10/23/2011  . Adenomatous polyps 05/14/2011    Colonoscopy (05/2011): 4 mm adenomatous polyp excised endoscopically Colonoscopy (02/2002): Adenomatous polyp excised endoscopically   . Gastroesophageal reflux disease   . Internal hemorrhoids 08/04/2012  . Osteoporosis     DEXA (12/09/2011): L-spine T -3.7, left hip T -1.4 DEXA (12/2004): L-spine T -2.6, left hip -0.1   . Allergic rhinitis 06/01/2012  . Bilateral cataracts 08/04/2012    Visually insignificant   . Chronic constipation 02/03/2011  . Chronic venous insufficiency 08/04/2012  . Paroxysmal atrial fibrillation ([place]) 10/22/2010    s/p Left atrial maze procedure for paroxysmal atrial fibrillation on 09/20/2010 by Dr Prescott Gum.  Subsequent splenic infarct, decision was made to re-anticoagulate with coumadin, likely life-long as this is the most likely cause of the splenic infarct.   . Chronic low back pain 10/06/2012  . Anemia of chronic disease 01/01/2013  . Obstructive sleep apnea     Nocturnal polysomnography (06/2009): Moderate sleep apnea/ hypopnea syndrome , AHI 17.8 per hour with nonpositional hypopneas. CPAP titration to 12 CWP, AHI 2.4 per hour. On nocturnal CPAP via a small resMed Quattro full-face mask with heated humidifier.   . Type 2 diabetes mellitus with diabetic neuropathy ([place])   . History of blood transfusion     "several times"   . Clear cell renal cell carcinoma ([place]) 07/21/2011    s/p cryoablation of  left RCC in 09/2011 by [name]. Followed by [name]  [facility]) .    Marland Kitchen Chronic daily headache 01/16/2014  . Right nephrolithiasis 09/06/2014    5 mm non-obstructing calculus seen on CT scan 09/05/2014   . Aortic atherosclerosis ([place]) 10/19/2014    Seen on CT scan, currently asymptomatic  . Shortness of breath dyspnea       Assessment: 65 YOF on Coumadin PTA for history of Afib presented with severe anemia and received  Vitamin K along with KCentra on 06/27/15.  She had obscure GI bleeding and endoscopy showed non-bleeding small bowel erosions, gastritis and non-bleeding AVM.  Pharmacy now consulted to resume Coumadin.  Last INR was sub-therapeutic at 1.41.  INR today = 1.28  Goal of Therapy:  INR 2-3, aim for 2-2.5    Plan:  - Coumadin 69m PO today - Would discharge home on 5 mg po daily with INR recheck Thursday - Daily PT / INR  Thank you LAnette Guarneri PharmD [phone]/23/2017, 11:13 AM

## 2015-07-03 NOTE — Progress Notes (Signed)
Subjective: Patient seen and examined.  No acute events overnight.  No new complaints.  Still no bowel movement after starting laxative.  She is waiting for breakfast and then plans to nap afterwards.  Her thrombophlebitis at old IV site on right wrist is improving.  Objective: Vital signs in last 24 hours: Filed Vitals:   07/02/15 1204 07/02/15 2015 07/02/15 2107 07/03/15 0321  BP: 104/71 138/51  132/41  Pulse: 85 77 79 84  Temp: 97.5 F (36.4 C) 98.5 F (36.9 C)  98.6 F (37 C)  TempSrc: Tympanic Oral  Oral  Resp: _0 Height:      Weight:    182 lb (82.555 kg)  SpO2: 93% 97% 98% 92%   Weight change: -2 lb 1.6 oz (-0.953 kg)  Intake/Output Summary (Last 24 hours) at 07/03/15 1038 Last data filed at 07/03/15 0848  Gross per 24 hour  Intake   1380 ml  Output   2950 ml  Net  -1570 ml   General: sitting on side of bed, chronically ill-appearing, no distress, conversant HEENT: EOMI Cardiac: distant heart sounds, RRR Pulm: normal effort Abd: soft, nontender, nondistended Ext: warm and well perfused, 1+ pedal edema, mild area of tenderness at site of old IV site Neuro: alert and oriented X3, cranial nerves II-XII grossly intact  Lab Results: Basic Metabolic Panel:  Recent Labs Lab 07/01/15 0455 07/03/15 0258  NA 139 138  K 3.8 3.1*  CL 108 98*  CO2 23 32  GLUCOSE 187* 230*  BUN 6 8  CREATININE 0.88 0.83  CALCIUM 8.5* 8.6*   Liver Function Tests:  Recent Labs Lab 06/28/15 0241  AST 79*  ALT 72*  ALKPHOS 84  BILITOT 1.5*  PROT 5.6*  ALBUMIN 2.7*   CBC:  Recent Labs Lab 06/27/15 1139  07/02/15 0237 07/03/15 0258  WBC 9.0  < > 9.2 9.6  NEUTROABS 6.9  --   --   --   HGB 3.6*  < > 8.1* 8.3*  HCT 13.5*  < > 28.3* 28.5*  MCV 68.2*  < > 80.2 81.0  PLT 329  < > 226 230  < > = values in this interval not displayed. Cardiac Enzymes:  Recent Labs Lab 06/27/15 1429 06/28/15 0241  TROPONINI <0.03 <0.03   CBG:  Recent Labs Lab  07/01/15 2059 07/02/15 0706 07/02/15 1202 07/02/15 1700 07/02/15 2223 07/03/15 0647  GLUCAP 180* 223* 210* 145* 227* 189*   Coagulation:  Recent Labs Lab 06/27/15 1139 06/28/15 0241 06/29/15 0439 07/03/15 0258  LABPROT 36.2* 18.2* 17.3* 16.1*  INR 3.76* 1.50* 1.41 1.28   Anemia Panel:  Recent Labs Lab 06/28/15 0241  RETICCTPCT 1.8   Urine Drug Screen: Drugs of Abuse     Component Value Date/Time   LABOPIA POSITIVE* 01/07/2013 2114   COCAINSCRNUR NONE DETECTED 01/07/2013 2114   LABBENZ NONE DETECTED 01/07/2013 2114   AMPHETMU NONE DETECTED 01/07/2013 2114   THCU NONE DETECTED 01/07/2013 2114   LABBARB NONE DETECTED 01/07/2013 2114     Micro Results: Recent Results (from the past 240 hour(s))  MRSA PCR Screening     Status: None   Collection Time: 06/27/15  2:28 PM  Result Value Ref Range Status   MRSA by PCR NEGATIVE NEGATIVE Final    Comment:        The GeneXpert MRSA Assay (FDA approved for NASAL specimens only), is one component of a comprehensive MRSA colonization surveillance program. It is not intended to diagnose  MRSA infection nor to guide or monitor treatment for MRSA infections.    Studies/Results: No results found. Medications: I have reviewed the patient's current medications. Scheduled Meds: . fluticasone  2 spray Each Nare Daily  . furosemide  80 mg Oral Daily  . HYDROcodone-acetaminophen  1 tablet Oral Once  . insulin aspart  0-5 Units Subcutaneous QHS  . insulin aspart  0-9 Units Subcutaneous TID WC  . insulin detemir  20 Units Subcutaneous Daily  . mometasone-formoterol  2 puff Inhalation BID  . nicotine  14 mg Transdermal Daily  . pantoprazole (PROTONIX) IV  40 mg Intravenous BID  . rosuvastatin  20 mg Oral QHS  . sertraline  50 mg Oral Daily  . tiotropium  18 mcg Inhalation Daily  . Warfarin - Pharmacist Dosing Inpatient   Does not apply q1800   Continuous Infusions:  PRN Meds:.albuterol, ALPRAZolam,  HYDROcodone-acetaminophen, ondansetron (ZOFRAN) IV Assessment/Plan: Active Problems:   Hyperlipidemia   Major depression, chronic (HCC)   Anxiety   Gastroesophageal reflux disease   Type 2 diabetes mellitus with diabetic neuropathy (HCC)   Fibromyalgia   Chronic congestive heart failure with left ventricular diastolic dysfunction (HCC)   Paroxysmal atrial fibrillation (HCC)   Obesity (BMI 30.0-34.9)   Acute GI bleeding  Symptomatic Anemia:  She has not had any further dark stool and, in fact, has not had a bowel movement at all.  Capsule study done showed a few nonbleeding small bowel erosions, mild gastritis, and a small nonbleeding AVM with no further GI work-up recommended.  Her hemoglobin is stable at 8.3 today. - appreciate GI assistance with management - d/c to home today - start ferrous gluconate daily as she reported this is easier on her stomach.  Can titrate to BID if hemoglobin not responding and patient able to tolerate - continue PPI - anti-coagulation as below  Constipation:  - no BM with milk of magnesia - mag citrate today  CHF: Echo from 08/2012 showing LVEF 55-60% -continuing lasix -daily weights -strict I/Os -potassium low today, will supplement and restart home potassium at d/c  DMII - levemir 20 bid at home - Levemir 20units daily - SSI- sensitive with HS  COPD: currently stable with no issues. Patient reports being on home oxygen. -continue Albuterol prn -continue Dulera BID -continue Spiriva daily   Atrial Fibrillation - telemetry - restarted coumadin - monitor rate control  Depression -stopped Buproprion 100 mg BID -she has been tapered off sertaline with last 24m dose being this morning.  We will d/c with prescription for prozac and instruct her to start taking in 2 days.  Tobacco use: currently smokes about 1 PPD -Nicotine patch   DVT ppx: SCDs  Diet: carb modified  Code: FULL  Dispo: Home today.  The patient does have a current  PCP ([name] MD) and [facility].  The patient does not have transportation limitations that hinder transportation to clinic appointments.  .Services Needed at time of discharge: Y = Yes, Blank = No PT:   OT:   RN:   Equipment:   Other:     LOS: 6 days   AJule Ser DO 07/03/2015, 10:38 AM

## 2015-07-03 NOTE — Progress Notes (Signed)
  Date: 07/03/2015  Patient name: [name]  Medical record number: [id number]  Date of birth: 08-07-1950   This patient has been seen and the plan of care was discussed with the house staff. Please see Dr. Alcario Drought note for complete details. I concur with his findings with the following additions/corrections: Plan for discharge today.   Sid Falcon, MD 07/03/2015, 3:29 PM

## 2015-07-03 NOTE — Discharge Summary (Signed)
Name: [name] MRN: [id number] DOB: 04-Apr-1950 65 y.o. PCP: Kathryn Linsey, MD  Date of Admission: 06/27/2015 11:51 AM Date of Discharge: 07/03/2015 Attending Physician: Dr. Gilles Horn  Discharge Diagnosis: 1. Iron Deficiency Anemia 2. Intermittent GI Bleed 3. Atrial Fibrillation 4. Depression   Active Problems:   Hyperlipidemia   Major depression, chronic (HCC)   Anxiety   Gastroesophageal reflux disease   Type 2 diabetes mellitus with diabetic neuropathy (HCC)   Fibromyalgia   Chronic congestive heart failure with left ventricular diastolic dysfunction (HCC)   Paroxysmal atrial fibrillation (HCC)   Obesity (BMI 30.0-34.9)   Acute GI bleeding  Discharge Medications:   Medication List    STOP taking these medications        buPROPion 100 MG tablet  Commonly known as:  WELLBUTRIN     sertraline 100 MG tablet  Commonly known as:  ZOLOFT      TAKE these medications        ACCU-CHEK FASTCLIX LANCET Kit  Check your blood 4 times a day dx code 250.00 insulin requiring     albuterol 108 (90 Base) MCG/ACT inhaler  Commonly known as:  PROAIR HFA  Inhale 1-2 puffs into the lungs every 6 (six) hours as needed for wheezing or shortness of breath.     alendronate 70 MG tablet  Commonly known as:  FOSAMAX  Take 1 tablet (70 mg total) by mouth once a week.     ALPRAZolam 1 MG tablet  Commonly known as:  XANAX  Take 0.5-1 tablets (0.5-1 mg total) by mouth 2 (two) times daily as needed for anxiety.     chlorpheniramine 4 MG tablet  Commonly known as:  CHLOR-TRIMETON  Take 1 tablet (4 mg total) by mouth 3 (three) times daily as needed for allergies.     diclofenac sodium 1 % Gel  Commonly known as:  VOLTAREN  Apply 2 g topically 4 (four) times daily as needed.     EASY TOUCH PEN NEEDLES 31G X 5 MM Misc  Generic drug:  Insulin Pen Needle  USE TO INJECT INSULIN 5 TIMES DAILY     ferrous gluconate 324 MG tablet  Commonly known as:  FERGON  Take 1 tablet  (324 mg total) by mouth daily with breakfast.     FLUoxetine 20 MG tablet  Commonly known as:  PROZAC  Take 1 tablet (20 mg total) by mouth daily.  Notes to Patient:  Begin taking on Thursday 5/25.     fluticasone 50 MCG/ACT nasal spray  Commonly known as:  FLONASE  Place 2 sprays into both nostrils daily.     Fluticasone-Salmeterol 250-50 MCG/DOSE Aepb  Commonly known as:  ADVAIR DISKUS  Inhale 1 puff into the lungs 2 (two) times daily.     furosemide 80 MG tablet  Commonly known as:  LASIX  Take 1 tablet (80 mg total) by mouth 2 (two) times daily.     gabapentin 300 MG capsule  Commonly known as:  NEURONTIN  Take 1 capsule (300 mg total) by mouth 3 (three) times daily.     HYDROcodone-acetaminophen 5-325 MG tablet  Commonly known as:  NORCO/VICODIN  Take 1-2 tablets by mouth every 6 (six) hours as needed for severe pain.     insulin aspart 100 UNIT/ML FlexPen  Commonly known as:  NOVOLOG FLEXPEN  Inject 18 Units into the skin 3 (three) times daily with meals.     Insulin Detemir 100 UNIT/ML Pen  Commonly known as:  LEVEMIR FLEXTOUCH  Inject 45 Units into the skin 2 (two) times daily.     lidocaine 2 % jelly  Commonly known as:  XYLOCAINE  APPLY TO AFFECTED AREA THREE TIMES A DAY     nystatin cream  Commonly known as:  MYCOSTATIN  Apply topically 2 (two) times daily.     omeprazole 40 MG capsule  Commonly known as:  PRILOSEC  Take 1 capsule (40 mg total) by mouth 2 (two) times daily.     ONETOUCH VERIO test strip  Generic drug:  glucose blood  Check blood sugar up to 4 times a day     ONETOUCH VERIO w/Device Kit  1 each by Does not apply route 4 (four) times daily.     potassium chloride SA 20 MEQ tablet  Commonly known as:  K-DUR,KLOR-CON  Take 1 tablet (20 mEq total) by mouth 2 (two) times daily.     rosuvastatin 20 MG tablet  Commonly known as:  CRESTOR  Take 1 tablet (20 mg total) by mouth at bedtime.     tiotropium 18 MCG inhalation capsule    Commonly known as:  SPIRIVA HANDIHALER  Place 1 capsule (18 mcg total) into inhaler and inhale daily.     warfarin 5 MG tablet  Commonly known as:  COUMADIN  Take 60m daily and follow up with the Coumadin Clinic on May 25 at 10:00am        Disposition and follow-up:   [name]    1.  At the hospital follow up visit please address:  - is she taking iron supplement daily? - has she started taking Prozac and stopped Zoloft & Wellbutrin?  Consider doing PHQ-9 to assess her depression - any further bleeding? - did she go to the Coumadin Clinic on May 25?  2.  Labs / imaging needed at time of follow-up: CBC, PT/INR, BMET  3.  Pending labs/ test needing follow-up: none  Follow-up Appointments: Follow-up Information    Follow up with WDuwaine Maxin DO On 07/12/2015.   Specialty:  Internal Medicine   Why:  appointment time at 10:15am   Contact information:   [place] [phone]      Discharge Instructions:   Consultations: Treatment Team:  MClarene Essex MD  Procedures Performed:  Dg Chest Port 1 View  06/27/2015  CLINICAL DATA:  Shortness of breath for 6 weeks. Cough for 3 days. Intermittent chest pain. EXAM: PORTABLE CHEST 1 VIEW COMPARISON:  10/25/2014 FINDINGS: Mild cardiomegaly stable. Both lungs are clear. No evidence of pneumothorax or pleural effusion. Prior median sternotomy noted. Fixation plate and screws noted in the right clavicle. Old right posterior rib fracture deformities also noted. IMPRESSION: Stable mild cardiomegaly.  No active lung disease. Electronically Signed   By: JEarle GellM.D.   On: 06/27/2015 12:11    Admission HPI:  Ms. GLAKYLA BISWASis a 65y.o. woman with complicated PMHx of COPD, pulmonary HTN, CHF, anemia of chronic disease, GERD, adenomatous polyps, internal hemorrhoids, paroxysmal atrial fibrillation, Type II DM, clear cell renal  cell carcinoma, aortic arthrosclerosis who presents due to "feeling anemic for 6 weeks". She reports her symptoms include feeling exhausted, short of breath, and being able to "hear my heart beat in my head." She reports that she has not been taking most of her home medications during this time due to lack of energy. The only medication she  regularly takes is her Coumadin and her anti-depressants. She has also noticed during this time that she has developed spontaneous shaking episodes all over her body that will last 5-10 seconds in duration and then resolve. During this time of symptoms she also reports melena and pica. She also reports some mild epigastric tenderness, feeling lightheaded, nausea, and headache. She denies any chest pain, syncope, diarrhea. Patient also had admission in Nov 2016 for symptomatic iron deficiency anemia where she had an FOBT that was negative, ferritin low at 8. During last admission, she did not want to be seen by GI either as inpatient or outpatient because she believes GI workup in the past has not explained her anemia. EGD from 05/2011 was normal and colonoscopy at the time showing internal hemorrhoids.   Hospital Course by problem list: Active Problems:   Hyperlipidemia   Major depression, chronic (HCC)   Anxiety   Gastroesophageal reflux disease   Type 2 diabetes mellitus with diabetic neuropathy (HCC)   Fibromyalgia   Chronic congestive heart failure with left ventricular diastolic dysfunction (HCC)   Paroxysmal atrial fibrillation (HCC)   Obesity (BMI 30.0-34.9)   Acute GI bleeding   Symptomatic Iron Deficiency Anemia: Due to slow, intermittent GI bleed and non-adherence to iron supplementation.  Patient had a hospitalization in late 2016 for symptomatic IDA where she was transfused 2 units and received Feraheme. During that admission, she refused GI consultation. Since that time, she has not been taking anything for her iron deficiency and has not  had her hemoglobin rechecked. She is having some intermittent melena but otherwise has not noticed any bleeding.  She presented this admission with a hemoglobin of 3.8 and approximately 6 weeks of progressive weakness, fatigue, and pica.  She was given vitamin K and Kcentra in the Emergency Department.  She also received 3 units of packed RBC's with appropriate response.  She received 1 dose of Feraheme as well.  Her hemoglobin trended upward and was >8 on day of discharge.  No further bleeding was identified.  Gastroenterology was consulted.  She had EGD which was negative.  She also underwent capsule study which revealed a few nonbleeding small bowel erosions, mild gastritis, and a small nonbleeding AVM with no further GI work-up recommended.  There were no interventions on the part of GI.  At discharge, we resumed her anticoagulation and prescribed daily iron supplementation.  Atrial Fibrillation: due to her high risk atrial fibrillation, it is felt that she needs to be chronically anti-coagulated.  In the setting of her low hemoglobin and awaiting GI work-up for potential bleeding, we held her anti-coagulation initially.  After negative work-up and approval from GI to resume anti-coagulation, we resumed Warfarin with the assistance of inpatient pharmacy.  At discharge, we continued her on Warfarin 64m daily per pharmacy recommendations.  [facility] with Pharmacist JMannie Stabilefor check of her PT/INR and Warfarin adjustment as needed at that time.    Depression: Patient reported to uKoreathat she was not taking her Wellbutrin as prescribed so we discontinued this medication.  She did report that she was taking her Zoloft.  It was decided in concert with her PCP (Dr. KEppie Gibson that we would taper off her Zoloft and begin treating her depression with Prozac.  While inpatient, we were able to fully taper her off Zoloft and a prescription was sent for Prozac to  begin a couple days after discharge.  Discharge Vitals:  BP 154/54 mmHg  Pulse 83  Temp(Src) 98.6 F (37 C) (Oral)  Resp 20  Ht _0  (1.575 m)  Wt 182 lb (82.555 kg)  BMI 33.28 kg/m2  SpO2 94%  Discharge Labs:  No results found. However, due to the size of the patient record, not all encounters were searched. Please check Results Review for a complete set of results.  Signed: Jule Ser, DO 07/04/2015, 11:23 AM    Services Ordered on Discharge: none Equipment Ordered on Discharge: none

## 2015-07-03 NOTE — Care Management Important Message (Signed)
Important Message  Patient Details  Name: [name] MRN: [id number] Date of Birth: 11/04/1950   Medicare Important Message Given:  Yes    Loann Quill 07/03/2015, 2:08 PM

## 2015-07-03 NOTE — Discharge Instructions (Signed)
Ms. Nethery,  It was a pleasure taking care of you while you were in the hospital.  Your hemoglobin has been stable for the past few days and our gastroenterologists did not find anything on their studies that required intervention.  We will send you home with a prescription for iron supplementation to take daily.  For your Coumadin: Take 91m daily and go to clinic this Thursday 07/05/2015 for a visit with our Pharmacist (Mannie Stabile to have your PT/INR checked.  They will adjust your Coumadin at that point.    For your anti-depressant treatment, please stop taking Wellbutrin and Zoloft.  In 2 days, begin taking Prozac.  I have sent this prescription to your pharmacy as well.  Please follow up in our clinic (details for hospital follow up above) after your hospital stay.  Also, plan to follow up with Dr. KEppie Gibsonin the future and with our Coumadin clinic.  Take care, Dr. AJule Ser

## 2015-07-05 ENCOUNTER — Ambulatory Visit (INDEPENDENT_AMBULATORY_CARE_PROVIDER_SITE_OTHER): Payer: PPO | Admitting: Pharmacist

## 2015-07-05 DIAGNOSIS — I480 Paroxysmal atrial fibrillation: Secondary | ICD-10-CM | POA: Diagnosis not present

## 2015-07-05 DIAGNOSIS — Z7901 Long term (current) use of anticoagulants: Secondary | ICD-10-CM | POA: Diagnosis not present

## 2015-07-05 LAB — POCT INR: INR: 1.3

## 2015-07-05 NOTE — Progress Notes (Signed)
INTERNAL MEDICINE TEACHING ATTENDING ADDENDUM - [name] M.D  Duration- indefinite, Indication- pAfib, INR- subtherapeutic. Agree with pharmacy recommendations as outlined in their note.

## 2015-07-05 NOTE — Progress Notes (Addendum)
Anti-Coagulation Progress Note  [name] is a 65 y.o. female who is currently on an anti-coagulation regimen.  Patient was recently hospitalized for GI bleed and coumadin was held. Upon discharge, patient was instructed to take 7m daily until her follow-up INR appointment. The patient was not compliant with these instructions and states that she took her usual 2.573mdaily doses instead. INR today was subtherapeutic @ 1.3. (goal 2-2.5). Instructed patient to take 16m83moday and tomorrow and then continue current regimen. Patient is going to follow up for another INR check in 1 week.   RECENT RESULTS: Recent results are below, the most recent result is correlated with a dose of 20 mg. per week: Lab Results  Component Value Date   INR 1.3 07/05/2015   INR 1.28 07/03/2015   INR 1.41 06/29/2015    ANTI-COAG DOSE: Anticoagulation Dose Instructions as of 07/05/2015      SunDorene Grebee Wed Thu Fri Sat   New Dose 2.5 mg 5 mg 2.5 mg 2.5 mg 5 mg 5 mg 2.5 mg       ANTICOAG SUMMARY: Anticoagulation Episode Summary    Current INR goal 2.0-3.0  Next INR check 07/12/2015  INR from last check 1.3! (07/05/2015)  Weekly max dose   Target end date   INR check location [facility]  Preferred lab   Send INR reminders to AN[place]Indications  Paroxysmal atrial fibrillation (HCCEtnaI48.0] Long term (current) use of anticoagulants [Z79.01]        Comments       Anticoagulation Care Providers    Provider Role Specialty Phone number   BriLelon PerlaD  Cardiology [phone]   ANTICOAG TODAY: Anticoagulation Summary as of 07/05/2015    INR goal 2.0-3.0  Selected INR 1.3! (07/05/2015)  Next INR check 07/12/2015  Target end date    Indications  Paroxysmal atrial fibrillation (HCCBradleyI48.0] Long term (current) use of anticoagulants [Z79.01]      Anticoagulation Episode Summary    INR check location [facility]   Preferred lab    Send INR reminders to  ANT[place]Comments     Anticoagulation Care Providers    Provider Role Specialty Phone number   BriLelon PerlaD  Cardiology [phone]   PATIENT INSTRUCTIONS: Patient Instructions  Patient instructed to take medications as defined in the Anti-coagulation Track section of this encounter.  Patient instructed to take today's dose.     FOLLOW-UP Return in 1 week (on 07/12/2015) for Follow -up INR _0 .  Sohum Delillo C. MilLennox GrumblesharmD Pharmacy Resident  Pager: [phone] 11:06 AM

## 2015-07-05 NOTE — Patient Instructions (Signed)
Patient instructed to take medications as defined in the Anti-coagulation Track section of this encounter.  Patient instructed to take today's dose.

## 2015-07-05 NOTE — Progress Notes (Signed)
Patient was seen in clinic by Tamala Julian, PharmD, PGY1 pharmacy resident and Marylou Flesher, PharmD Candidate. I agree with the assessment and plan of care documented.

## 2015-07-12 ENCOUNTER — Ambulatory Visit (INDEPENDENT_AMBULATORY_CARE_PROVIDER_SITE_OTHER): Payer: PPO | Admitting: Pharmacist

## 2015-07-12 ENCOUNTER — Ambulatory Visit (INDEPENDENT_AMBULATORY_CARE_PROVIDER_SITE_OTHER): Payer: PPO | Admitting: Internal Medicine

## 2015-07-12 ENCOUNTER — Encounter: Payer: Self-pay | Admitting: Internal Medicine

## 2015-07-12 VITALS — BP 144/44 | HR 84 | Temp 98.6°F | Ht 62.0 in | Wt 178.6 lb

## 2015-07-12 DIAGNOSIS — I480 Paroxysmal atrial fibrillation: Secondary | ICD-10-CM

## 2015-07-12 DIAGNOSIS — R51 Headache: Secondary | ICD-10-CM

## 2015-07-12 DIAGNOSIS — Z794 Long term (current) use of insulin: Secondary | ICD-10-CM | POA: Diagnosis not present

## 2015-07-12 DIAGNOSIS — F329 Major depressive disorder, single episode, unspecified: Secondary | ICD-10-CM

## 2015-07-12 DIAGNOSIS — K922 Gastrointestinal hemorrhage, unspecified: Secondary | ICD-10-CM | POA: Diagnosis not present

## 2015-07-12 DIAGNOSIS — D649 Anemia, unspecified: Secondary | ICD-10-CM | POA: Diagnosis not present

## 2015-07-12 DIAGNOSIS — R110 Nausea: Secondary | ICD-10-CM

## 2015-07-12 DIAGNOSIS — Z7901 Long term (current) use of anticoagulants: Secondary | ICD-10-CM

## 2015-07-12 DIAGNOSIS — E876 Hypokalemia: Secondary | ICD-10-CM | POA: Diagnosis not present

## 2015-07-12 DIAGNOSIS — E1140 Type 2 diabetes mellitus with diabetic neuropathy, unspecified: Secondary | ICD-10-CM

## 2015-07-12 DIAGNOSIS — R519 Headache, unspecified: Secondary | ICD-10-CM

## 2015-07-12 LAB — POCT GLYCOSYLATED HEMOGLOBIN (HGB A1C): Hemoglobin A1C: 6

## 2015-07-12 LAB — POCT INR: INR: 2.1

## 2015-07-12 LAB — GLUCOSE, CAPILLARY: Glucose-Capillary: 160 mg/dL — ABNORMAL HIGH (ref 65–99)

## 2015-07-12 NOTE — Progress Notes (Signed)
INTERNAL MEDICINE TEACHING ATTENDING ADDENDUM - [name] M.D  Duration- indefinite, Indication- pAfib, INR- therapeutic. Agree with pharmacy recommendations as outlined in their note.

## 2015-07-12 NOTE — Progress Notes (Signed)
Anticoagulation Management [name] is a 65 y.o. female who reports to the clinic for monitoring of warfarin treatment.    Indication: atrial fibrillation Duration: indefinite  Anticoagulation Clinic Visit History: Patient does report signs/symptoms of bleeding or thromboembolism  Anticoagulation Episode Summary    Current INR goal 2.0-3.0  Next INR check 07/26/2015  INR from last check 2.1 (07/12/2015)  Weekly max dose   Target end date   INR check location [facility]  Preferred lab   Send INR reminders to [place]   Indications  Paroxysmal atrial fibrillation ([place]) [I48.0] Long term (current) use of anticoagulants [Z79.01]        Comments       Anticoagulation Care Providers    Provider Role Specialty Phone number   Lelon Perla, MD  Cardiology [phone]     ASSESSMENT Recent Results: The most recent result is correlated with 25 mg per week: Lab Results  Component Value Date   INR 2.1 07/12/2015   INR 1.3 07/05/2015   INR 1.28 07/03/2015   Anticoagulation Dosing: INR as of 07/12/2015 and Previous Dosing Information    INR Dt INR Goal [employer] Sun Mon Tue Wed Thu Fri Sat   07/12/2015 2.1 2.0-3.0 25 mg 2.5 mg 5 mg 2.5 mg 2.5 mg 5 mg 5 mg 2.5 mg    Anticoagulation Dose Instructions as of 07/12/2015      Total Sun Mon Tue Wed Thu Fri Sat   New Dose 22.5 mg 2.5 mg 5 mg 2.5 mg 2.5 mg 2.5 mg 5 mg 2.5 mg     (5 mg x 0.5)  (5 mg x 1)  (5 mg x 0.5)  (5 mg x 0.5)  (5 mg x 0.5)  (5 mg x 1)  (5 mg x 0.5)                         Description        Patient refused to continue dosage (25 mg/week), so reduced to 22.5 mg/week also taking into consideration recent GI bleed      INR today: Therapeutic  PLAN Due to patient preference as stated above, weekly dose was decreased by 10% to 22.5 mg per week.  Patient Instructions  Patient educated about medication as defined in this encounter and verbalized understanding by repeating back  instructions provided.   Patient advised to contact clinic or seek medical attention if signs/symptoms of bleeding or thromboembolism occur.  Patient verbalized understanding by repeating back information and was advised to contact me if further medication-related questions arise. Patient was also provided an information handout.  Follow-up Return in about 2 weeks (around 07/26/2015) for Follow up 07/26/2015 10 am.  Kim,Jennifer J  15 minutes spent face-to-face with the patient during the encounter. 50% of time spent on education. 50% of time was spent on assessment and plan.

## 2015-07-12 NOTE — Progress Notes (Signed)
Subjective:    Patient ID: [name], female    DOB: [birth year], 65 y.o.   MRN: [id number]  HPI Comments: [name] is a 65 year old woman with PMH as below here for hospital follow-up appt.  She was recently admitted 05/17-05/23 for symptomatic anemia believed to be secondary to GI bleeding.   Her symptoms, including breathing and lightheadedness have improved some since discharge.  She has resumed her coumadin for Afib and INR was checked today.  She has not noted blood loss.  She reports compliance with Fe supplement.  Her main complaint today is headache since hospital discharge.  She describes frontal and occipital headache 8/10 and constant.  She reports hx of headache but says this is worse.  Headaches are worse in the morning.  She has associated nausea but no vomiting.  She notes intermittent blurry vision but no visual loss.  There is no fever.  There is no change in her chronic fibromyalgia pain.  She feels appetite is a little better since discharge. She takes Vicodin for chronic pain but says it has not helped this headache.  She reports compliance with CPAP for OSA.  She picked up new Prozac rx two days after hospital discharge.    Past Medical History  Diagnosis Date  . COPD (chronic obstructive pulmonary disease) with emphysema (HCC)     PFTs 02/2012: FEV1 0.92 (40%), ratio 69, 27% increase in FEV1 with BD, TLC 91%, severe airtrapping, DLCO49% On chronic home O2. Pulmonary rehab referral 05/2012   . Moderate to severe pulmonary hypertension ([place])     2014 TEE w PA peak pressure 46 mmHg, s/p MV replacement   . Tobacco abuse 07/28/2012  . Hyperlipidemia LDL goal < 100 11/20/2005  . Carotid artery stenosis     s/p right endarterectomy (06/2010) Carotid US (07/2010):  Left: Moderate-to-severe (60-79%) calcific and non-calcific plaque origin and proximal ICA and ECA   . Mitral stenosis     s/p Mitral valve replacement with a 27-mm pericardial porcine valve (Medtronic Mosaic  valve, serial #[id number] on 09/20/10, Dr. Prescott Gum)   . Chronic congestive heart failure with left ventricular diastolic dysfunction ([place]) 10/21/2010  . Depression 11/19/2005  . Anxiety 07/24/2010  . Fibromyalgia 08/29/2010  . Obesity (BMI 30.0-34.9) 10/23/2011  . Adenomatous polyps 05/14/2011    Colonoscopy (05/2011): 4 mm adenomatous polyp excised endoscopically Colonoscopy (02/2002): Adenomatous polyp excised endoscopically   . Gastroesophageal reflux disease   . Internal hemorrhoids 08/04/2012  . Osteoporosis     DEXA (12/09/2011): L-spine T -3.7, left hip T -1.4 DEXA (12/2004): L-spine T -2.6, left hip -0.1   . Allergic rhinitis 06/01/2012  . Bilateral cataracts 08/04/2012    Visually insignificant   . Chronic constipation 02/03/2011  . Chronic venous insufficiency 08/04/2012  . Paroxysmal atrial fibrillation ([place]) 10/22/2010    s/p Left atrial maze procedure for paroxysmal atrial fibrillation on 09/20/2010 by Dr Prescott Gum.  Subsequent splenic infarct, decision was made to re-anticoagulate with coumadin, likely life-long as this is the most likely cause of the splenic infarct.   . Chronic low back pain 10/06/2012  . Anemia of chronic disease 01/01/2013  . Obstructive sleep apnea     Nocturnal polysomnography (06/2009): Moderate sleep apnea/ hypopnea syndrome , AHI 17.8 per hour with nonpositional hypopneas. CPAP titration to 12 CWP, AHI 2.4 per hour. On nocturnal CPAP via a small resMed Quattro full-face mask with heated humidifier.   . Type 2 diabetes mellitus with diabetic neuropathy ([place])   .  History of blood transfusion     "several times"   . Clear cell renal cell carcinoma ([place]) 07/21/2011    s/p cryoablation of left RCC in 09/2011 by [name]. Followed by [name]  [facility]) .    Marland Kitchen Chronic daily headache 01/16/2014  . Right nephrolithiasis 09/06/2014    5 mm non-obstructing calculus seen on CT scan 09/05/2014   . Aortic atherosclerosis ([place]) 10/19/2014    Seen on CT scan,  currently asymptomatic  . Shortness of breath dyspnea    Current Outpatient Prescriptions on File Prior to Visit  Medication Sig Dispense Refill  . albuterol (PROAIR HFA) 108 (90 BASE) MCG/ACT inhaler Inhale 1-2 puffs into the lungs every 6 (six) hours as needed for wheezing or shortness of breath. 8.5 g 11  . alendronate (FOSAMAX) 70 MG tablet Take 1 tablet (70 mg total) by mouth once a week. (Patient not taking: Reported on 06/27/2015) 12 tablet 3  . ALPRAZolam (XANAX) 1 MG tablet Take 0.5-1 tablets (0.5-1 mg total) by mouth 2 (two) times daily as needed for anxiety. 60 tablet 5  . Blood Glucose Monitoring Suppl (ONETOUCH VERIO) W/DEVICE KIT 1 each by Does not apply route 4 (four) times daily. 1 kit 0  . chlorpheniramine (CHLOR-TRIMETON) 4 MG tablet Take 1 tablet (4 mg total) by mouth 3 (three) times daily as needed for allergies. (Patient not taking: Reported on 06/27/2015) 90 tablet 11  . diclofenac sodium (VOLTAREN) 1 % GEL Apply 2 g topically 4 (four) times daily as needed. (Patient not taking: Reported on 06/27/2015) 100 g 3  . EASY TOUCH PEN NEEDLES 31G X 5 MM MISC USE TO INJECT INSULIN 5 TIMES DAILY 200 each 8  . ferrous gluconate (FERGON) 324 MG tablet Take 1 tablet (324 mg total) by mouth daily with breakfast. 30 tablet 2  . FLUoxetine (PROZAC) 20 MG tablet Take 1 tablet (20 mg total) by mouth daily. 30 tablet 2  . fluticasone (FLONASE) 50 MCG/ACT nasal spray Place 2 sprays into both nostrils daily. (Patient not taking: Reported on 06/27/2015) 16 g 3  . Fluticasone-Salmeterol (ADVAIR DISKUS) 250-50 MCG/DOSE AEPB Inhale 1 puff into the lungs 2 (two) times daily. 180 each 3  . furosemide (LASIX) 80 MG tablet Take 1 tablet (80 mg total) by mouth 2 (two) times daily. 180 tablet 3  . gabapentin (NEURONTIN) 300 MG capsule Take 1 capsule (300 mg total) by mouth 3 (three) times daily. (Patient not taking: Reported on 06/27/2015) 270 capsule 3  . HYDROcodone-acetaminophen (NORCO/VICODIN) 5-325 MG  tablet Take 1-2 tablets by mouth every 6 (six) hours as needed for severe pain. 180 tablet 0  . insulin aspart (NOVOLOG FLEXPEN) 100 UNIT/ML FlexPen Inject 18 Units into the skin 3 (three) times daily with meals. (Patient not taking: Reported on 06/27/2015) 30 mL 11  . Insulin Detemir (LEVEMIR FLEXTOUCH) 100 UNIT/ML Pen Inject 45 Units into the skin 2 (two) times daily. (Patient not taking: Reported on 06/27/2015) 30 mL 10  . Lancets Misc. (ACCU-CHEK FASTCLIX LANCET) KIT Check your blood 4 times a day dx code 250.00 insulin requiring (Patient not taking: Reported on 06/27/2015) 1 kit 2  . lidocaine (XYLOCAINE) 2 % jelly APPLY TO AFFECTED AREA THREE TIMES A DAY (Patient not taking: Reported on 06/27/2015) 30 mL 11  . nystatin cream (MYCOSTATIN) Apply topically 2 (two) times daily. (Patient not taking: Reported on 06/27/2015) 30 g 3  . omeprazole (PRILOSEC) 40 MG capsule Take 1 capsule (40 mg total) by mouth  2 (two) times daily. 180 capsule 3  . ONETOUCH VERIO test strip Check blood sugar up to 4 times a day (Patient not taking: Reported on 06/27/2015) 350 each 11  . potassium chloride SA (K-DUR,KLOR-CON) 20 MEQ tablet Take 1 tablet (20 mEq total) by mouth 2 (two) times daily. (Patient not taking: Reported on 06/27/2015) 180 tablet 3  . rosuvastatin (CRESTOR) 20 MG tablet Take 1 tablet (20 mg total) by mouth at bedtime. 90 tablet 3  . tiotropium (SPIRIVA HANDIHALER) 18 MCG inhalation capsule Place 1 capsule (18 mcg total) into inhaler and inhale daily. 90 capsule 3  . warfarin (COUMADIN) 5 MG tablet Take 48m daily and follow up with the [facility] on May 25 at 10:00am 20 tablet 1   No current facility-administered medications on file prior to visit.    Review of Systems  Constitutional: Positive for appetite change. Negative for fever.       Appetite a little better.  Eyes: Positive for visual disturbance.       Occasional blurry vision.  No diplopia or vision loss.  Respiratory: Negative for  shortness of breath.        Dyspnea has improved since discharge.  She is comfortable on home oxygen.  Gastrointestinal: Positive for nausea. Negative for vomiting and blood in stool.  Genitourinary: Negative for hematuria.  Neurological: Positive for headaches.  Psychiatric/Behavioral: Positive for dysphoric mood. Negative for suicidal ideas.       Filed Vitals:   07/12/15 1023  BP: 144/44  Pulse: 84  Temp: 98.6 F (37 C)  TempSrc: Oral  Height: _0  (1.575 m)  Weight: 178 lb 9.6 oz (81.012 kg)  SpO2: 96%    Objective:   Physical Exam  Constitutional: She is oriented to person, place, and time. She appears well-developed. No distress.  Chronically ill appearing woman, in NAD, non-toxic appearing.    HENT:  Head: Normocephalic and atraumatic.  Mouth/Throat: Oropharynx is clear and moist. No oropharyngeal exudate.  There is no temporal artery enlargement or TTP.  Eyes: Conjunctivae and EOM are normal. Pupils are equal, round, and reactive to light. No scleral icterus.  R 20/50, L 20/70  Neck: Neck supple.  Cardiovascular: Normal rate, regular rhythm and normal heart sounds.  Exam reveals no gallop and no friction rub.   No murmur heard. Pulmonary/Chest: Effort normal and breath sounds normal. No respiratory distress. She has no wheezes. She has no rales.  Wearing supplemental oxygen.  Abdominal: Soft. Bowel sounds are normal. She exhibits no distension. There is no tenderness. There is no rebound.  Musculoskeletal: She exhibits no edema or tenderness.  Neurological: She is alert and oriented to person, place, and time. No cranial nerve deficit.  Skin: Skin is warm and dry. She is not diaphoretic.  Psychiatric: She has a normal mood and affect. Her behavior is normal.  Vitals reviewed.         Assessment & Plan:  Please see problem based charting for A&P.

## 2015-07-12 NOTE — Patient Instructions (Signed)
Patient educated about medication as defined in this encounter and verbalized understanding by repeating back instructions provided.

## 2015-07-13 LAB — BMP8+ANION GAP
Anion Gap: 17 mmol/L (ref 10.0–18.0)
BUN/Creatinine Ratio: 18 (ref 12–28)
BUN: 12 mg/dL (ref 8–27)
CO2: 22 mmol/L (ref 18–29)
Calcium: 9 mg/dL (ref 8.7–10.3)
Chloride: 105 mmol/L (ref 96–106)
Creatinine, Ser: 0.66 mg/dL (ref 0.57–1.00)
GFR calc Af Amer: 106 mL/min/{1.73_m2} (ref >59)
GFR calc non Af Amer: 92 mL/min/{1.73_m2} (ref >59)
Glucose: 144 mg/dL — ABNORMAL HIGH (ref 65–99)
Potassium: 4.2 mmol/L (ref 3.5–5.2)
Sodium: 144 mmol/L (ref 134–144)

## 2015-07-13 LAB — CBC
Hematocrit: 33.7 % — ABNORMAL LOW (ref 34.0–46.6)
Hemoglobin: 10.2 g/dL — ABNORMAL LOW (ref 11.1–15.9)
MCH: 25.1 pg — ABNORMAL LOW (ref 26.6–33.0)
MCHC: 30.3 g/dL — ABNORMAL LOW (ref 31.5–35.7)
MCV: 83 fL (ref 79–97)
Platelets: 294 10*3/uL (ref 150–379)
RBC: 4.06 x10E6/uL (ref 3.77–5.28)
RDW: 27.9 % — ABNORMAL HIGH (ref 12.3–15.4)
WBC: 5.2 10*3/uL (ref 3.4–10.8)

## 2015-07-13 MED ORDER — PROMETHAZINE HCL 12.5 MG PO TABS: 12.5000 mg | ORAL_TABLET | Freq: Three times a day (TID) | ORAL | Status: DC | PRN

## 2015-07-13 NOTE — Assessment & Plan Note (Addendum)
Assessment:  Patient with hx of headache now reportedly worse since discharge.  She has just started new med, Prozac which could cause headache, however this could be related to other issues (OSA, chronic daily headache, medication overuse headache - on chronic Vicodin, tension).  There are no signs of encephalitis/meningitis, vision ok and no exam findings to suggest GCA, no neuro deficits to suggest other CNS etiology.  I am hesitant to stop Prozac as she does report some (slight) improvement in her depression symptoms after only 1 week on therapy and she has failed several other anti-depressants in the past. Plan:   - continue Prozac for now - continue CPAP for OSA (she reports compliance) - Vicodin prn (try to decrease use) - she request IM toradol but I explained I cannot give it because she has had recent GI bleed.  She voiced understanding. - I have asked her to follow-up with PCP at his next available opening - call clinic or to ED with new or worsening symptoms.

## 2015-07-13 NOTE — Assessment & Plan Note (Addendum)
Assessment:  Symptoms improved since admission.  Breathing at baseline.  She has not noted any bleeding since resuming coumadin for Afib.  Small bowel erosion, non bleeding AVMs and mild gastritis noted on capsule endoscopy.  No further work-up was recommended by GI. Plan:  Check CBC today for hgb stability.  Continue Fe supplement. Avoid NSAIDs.  Continue PPI. Follow symptomatically.

## 2015-07-13 NOTE — Progress Notes (Signed)
Internal Medicine Clinic Attending  Case discussed with Dr. Redmond Pulling soon after the resident saw the patient.  We reviewed the resident's history and exam and pertinent patient test results.  I agree with the assessment, diagnosis, and plan of care documented in the resident's note.

## 2015-07-13 NOTE — Assessment & Plan Note (Signed)
>>  ASSESSMENT AND PLAN FOR ACUTE ON CHRONIC ANEMIA WRITTEN ON 07/13/2015 12:57 PM BY Yolanda Manges, DO  Assessment:  Symptoms improved since admission.  Breathing at baseline.  She has not noted any bleeding since resuming coumadin for Afib.  Small bowel erosion, non bleeding AVMs and mild gastritis noted on capsule endoscopy.  No further work-up was recommended by GI. Plan:  Check CBC today for hgb stability.  Continue Fe supplement. Avoid NSAIDs.  Continue PPI. Follow symptomatically.

## 2015-07-13 NOTE — Assessment & Plan Note (Signed)
Assessment:  Still with depressive symptoms but denies SI.  She also notes increased activities on her computer which is an improvement for her.  Appetite is also a little better.  Her PHQ 9 is 16.  Review of EPIC reveals she has tried several other anti-depressants in the past without much success.   Plan: - Unclear if her recurrent headache symptoms are due to her Prozac so I think the benefit of continuing this med while addressing other possible headache triggers (see chronic daily headache A&P) may be best bet for now.   - to ED with new or worsening symptoms or thoughts of self harm

## 2015-07-14 DIAGNOSIS — R110 Nausea: Secondary | ICD-10-CM | POA: Insufficient documentation

## 2015-07-14 DIAGNOSIS — E876 Hypokalemia: Secondary | ICD-10-CM | POA: Insufficient documentation

## 2015-07-14 NOTE — Assessment & Plan Note (Signed)
Assessment:  A1c 6 today.  DM is well controlled.  We did not discuss this issue during this hospital follow-up visit.   Plan:  Continue current therapy.  Will have her follow-up regarding this and other chronic conditions with PCP in the next few weeks.

## 2015-07-14 NOTE — Assessment & Plan Note (Signed)
Assessment:  K low during hospital admission. Plan:  BMP today - K 4.2.

## 2015-07-14 NOTE — Assessment & Plan Note (Signed)
Assessment:  Patient reports nausea associated with her headaches.  She says she was given anti-emetics in the hospital and she has been using expired Phenergan rx that she has at home. Plan:  Prn phenergan refill provided.

## 2015-07-19 ENCOUNTER — Encounter: Payer: Self-pay | Admitting: Radiology

## 2015-07-19 ENCOUNTER — Other Ambulatory Visit: Payer: Self-pay | Admitting: Radiology

## 2015-07-19 ENCOUNTER — Other Ambulatory Visit (HOSPITAL_COMMUNITY): Payer: Self-pay | Admitting: Interventional Radiology

## 2015-07-19 DIAGNOSIS — C642 Malignant neoplasm of left kidney, except renal pelvis: Secondary | ICD-10-CM

## 2015-07-19 DIAGNOSIS — R52 Pain, unspecified: Secondary | ICD-10-CM

## 2015-07-24 ENCOUNTER — Other Ambulatory Visit: Payer: Self-pay | Admitting: Internal Medicine

## 2015-07-24 DIAGNOSIS — Z794 Long term (current) use of insulin: Principal | ICD-10-CM

## 2015-07-24 DIAGNOSIS — E1140 Type 2 diabetes mellitus with diabetic neuropathy, unspecified: Secondary | ICD-10-CM

## 2015-07-26 ENCOUNTER — Ambulatory Visit: Payer: PPO | Admitting: Pharmacist

## 2015-07-30 ENCOUNTER — Encounter: Payer: Self-pay | Admitting: *Deleted

## 2015-08-08 ENCOUNTER — Ambulatory Visit (INDEPENDENT_AMBULATORY_CARE_PROVIDER_SITE_OTHER): Payer: PPO | Admitting: Pharmacist

## 2015-08-08 ENCOUNTER — Encounter: Payer: Self-pay | Admitting: Internal Medicine

## 2015-08-08 ENCOUNTER — Ambulatory Visit (INDEPENDENT_AMBULATORY_CARE_PROVIDER_SITE_OTHER): Payer: PPO | Admitting: Internal Medicine

## 2015-08-08 ENCOUNTER — Other Ambulatory Visit (HOSPITAL_COMMUNITY): Payer: Self-pay | Admitting: Interventional Radiology

## 2015-08-08 VITALS — BP 134/46 | HR 79 | Temp 98.6°F | Wt 181.3 lb

## 2015-08-08 DIAGNOSIS — Z794 Long term (current) use of insulin: Secondary | ICD-10-CM

## 2015-08-08 DIAGNOSIS — E1140 Type 2 diabetes mellitus with diabetic neuropathy, unspecified: Secondary | ICD-10-CM

## 2015-08-08 DIAGNOSIS — M797 Fibromyalgia: Secondary | ICD-10-CM

## 2015-08-08 DIAGNOSIS — K922 Gastrointestinal hemorrhage, unspecified: Secondary | ICD-10-CM | POA: Diagnosis not present

## 2015-08-08 DIAGNOSIS — E1165 Type 2 diabetes mellitus with hyperglycemia: Secondary | ICD-10-CM | POA: Diagnosis not present

## 2015-08-08 DIAGNOSIS — Z7901 Long term (current) use of anticoagulants: Secondary | ICD-10-CM | POA: Diagnosis not present

## 2015-08-08 DIAGNOSIS — D649 Anemia, unspecified: Secondary | ICD-10-CM | POA: Diagnosis not present

## 2015-08-08 DIAGNOSIS — F419 Anxiety disorder, unspecified: Secondary | ICD-10-CM

## 2015-08-08 DIAGNOSIS — Z23 Encounter for immunization: Secondary | ICD-10-CM | POA: Diagnosis not present

## 2015-08-08 DIAGNOSIS — K5520 Angiodysplasia of colon without hemorrhage: Secondary | ICD-10-CM

## 2015-08-08 DIAGNOSIS — K222 Esophageal obstruction: Secondary | ICD-10-CM | POA: Insufficient documentation

## 2015-08-08 DIAGNOSIS — I480 Paroxysmal atrial fibrillation: Secondary | ICD-10-CM

## 2015-08-08 DIAGNOSIS — F329 Major depressive disorder, single episode, unspecified: Secondary | ICD-10-CM

## 2015-08-08 HISTORY — DX: Angiodysplasia of colon without hemorrhage: K55.20

## 2015-08-08 LAB — POCT INR: INR: 5.8

## 2015-08-08 MED ORDER — ALPRAZOLAM 1 MG PO TABS: 0.5000 mg | ORAL_TABLET | Freq: Two times a day (BID) | ORAL | Status: DC | PRN

## 2015-08-08 NOTE — Patient Instructions (Signed)
It was good to see you again.  1) Slowly increase the gabapentin by 1 tablet a day every week up to 2 tablets three times a day for your fibromyalgia.  2) I am going to refer you to [facility] for further teaching on the V-Go insulin.  3) I increased your Xanax by 5 tablets a month.  4) Keep taking all of your medications like you are.  I will see you at my next available opening, sooner if necessary.

## 2015-08-08 NOTE — Patient Instructions (Signed)
Patient educated about medication as defined in this encounter and verbalized understanding by repeating back instructions provided.   

## 2015-08-08 NOTE — Assessment & Plan Note (Signed)
Assessment  We reviewed her glucometer and she had an average glucose of 188. This is after 74 readings. Unfortunately, this includes both preprandial and postprandial values. That being said most of the values are in the upper 100s and occasional lower 200s. This is on Levemir 40 units twice daily and NovoLog 18 units 3 times daily with meals. She states that she is experiencing increasing pain every time she sticks herself with insulin. She was recently mailed information on the V Go system and is interested in trying this method of insulin delivery as it requires only one stick a day.  Plan  She will be referred to [facility] for teaching on the use of the V Go. As her weight is less than 200 pounds we will start with the V Go 20 and change the bolus to 6 units before meals 3 times daily. Although this may under dose her insulin, it is following the weight-based guidelines for initiating the V Go system and will allow [place] to adjust the insulin dosing from there. We will review her glycemic control by review of her glucometer and hemoglobin A1c at the follow-up visit and make appropriate adjustments.

## 2015-08-08 NOTE — Assessment & Plan Note (Signed)
>>  ASSESSMENT AND PLAN FOR ACUTE ON CHRONIC ANEMIA WRITTEN ON 08/08/2015  5:48 PM BY Doneen Poisson, MD  Assessment  She was recently admitted to hospital with symptomatic anemia. It was felt to be secondary to chronic GI blood loss although the evaluation with an EGD and capsule endoscopy were only notable for mild gastritis and occasional erosion of the small bowel and one nonbleeding AVM. Her symptoms resolved with transfusion. She has also restarted the oral iron therapy. She denies any of her previous symptoms that she had when she was anemic with regards to fatigue and worsening shortness of breath. She does admit to a return in her PICA over the last couple of days. Her INR today was 5.8.  Plan  A CBC was drawn today and is pending at the time of this dictation. A ferritin is also pending. She was encouraged to continue to use the iron supplementation. We did not have time to discuss the issue of anticoagulation and risks versus benefits given the 2 admissions over the past year for symptomatic anemia likely secondary to GI blood loss. Her CHAD-VASC 2 score was 5 which gives her a 6.7% risk of a cerebrovascular accident per year. This issue will be discussed at the follow-up visit. In the meantime, we will continue her warfarin therapy but adjust the dose downward to 2.5 mg daily with a repeat INR check in 1 week.

## 2015-08-08 NOTE — Assessment & Plan Note (Signed)
She received the Pneumovax 23 today. At the follow-up visit we will discuss the issue of mammography and an eye examination. We will also review whether or not she obtained a TENS unit for her chronic back pain.

## 2015-08-08 NOTE — Assessment & Plan Note (Signed)
Assessment  She feels much improved with regards to her depression since changing from Zoloft to Prozac. Her PHQ 9 dropped from 12 to 8 over the last 3 weeks.  Plan  We will continue the fluoxetine 20 mg by mouth daily and reassess her PHQ 9 and subjective depression symptoms at the follow-up visit.

## 2015-08-08 NOTE — Progress Notes (Signed)
Anticoagulation Management [name] is a 65 y.o. female who reports to the clinic for monitoring of warfarin treatment.    Indication: atrial fibrillation Duration: indefinite  Anticoagulation Clinic Visit History: Patient does not report signs/symptoms of bleeding or thromboembolism. Patient states she started fluoxetine 2 weeks ago which can enhance the effects of warfarin  Anticoagulation Episode Summary    Current INR goal 2.0-3.0  Next INR check 08/15/2015  INR from last check 5.8! (08/08/2015)  Weekly max dose   Target end date   INR check location [facility]  Preferred lab   Send INR reminders to [place]   Indications  Paroxysmal atrial fibrillation ([place]) [I48.0] Long term (current) use of anticoagulants [Z79.01]        Comments       Anticoagulation Care Providers    Provider Role Specialty Phone number   Lelon Perla, MD  Cardiology [phone]     ASSESSMENT Recent Results: The most recent result is correlated with 22.5 mg per week: Lab Results  Component Value Date   INR 5.8 08/08/2015   INR 2.1 07/12/2015   INR 1.3 07/05/2015   Anticoagulation Dosing: INR as of 08/08/2015 and Previous Dosing Information    INR Dt INR Goal [name] [name] [name] [name] [name] [name] [name] [name]   08/08/2015 5.8 2.0-3.0 22.5 mg 2.5 mg 5 mg 2.5 mg 2.5 mg 2.5 mg 5 mg 2.5 mg    Previous description        Patient refused to continue dosage (25 mg/week), so reduced to 22.5 mg/week also taking into consideration recent GI bleed    Anticoagulation Dose Instructions as of 08/08/2015      Total [name] [name] [name] [name] [name] [name] [name]   New Dose 17.5 mg 2.5 mg 2.5 mg 2.5 mg 2.5 mg 2.5 mg 2.5 mg 2.5 mg     (5 mg x 0.5)  (5 mg x 0.5)  (5 mg x 0.5)  (5 mg x 0.5)  (5 mg x 0.5)  (5 mg x 0.5)  (5 mg x 0.5)                         Description        Hold 2 doses      INR today: Supratherapeutic  PLAN Patient was instructed to hold 2 doses then reduce weekly dose to  17.5 mg (2.5 mg daily). Follow up within 1 week.  Patient Instructions  Patient educated about medication as defined in this encounter and verbalized understanding by repeating back instructions provided.   Patient advised to contact clinic or seek medical attention if signs/symptoms of bleeding or thromboembolism occur.  Patient verbalized understanding by repeating back information and was advised to contact me if further medication-related questions arise. Patient was also provided an information handout.  [name]  20 minutes spent face-to-face with the patient during the encounter. 50% of time spent on education. 50% of time was spent on assessment and plan.

## 2015-08-08 NOTE — Assessment & Plan Note (Signed)
Assessment  She states that she remains anxious but responds well to as needed Xanax. She is requesting to have enough Xanax on hand so that she does not take her last dose just as the new prescription is being mailed. She is well aware of the importance to keep these medications secure given the 2 teenagers living with her and she has hid her benzodiazepine and opiates because of this.  Plan  We will continue the Xanax at 0.5-1 mg twice daily as needed and add an additional 5 tablets per month to make sure that she does not run out at the end of the month. We will reassess her symptoms of anxiety at the follow-up visit.

## 2015-08-08 NOTE — Assessment & Plan Note (Signed)
Assessment  Since discharge from the hospital she states that her fibromyalgia has been particularly severe. She admits to having stopped the gabapentin prior to admission and was only taking 300 mg twice daily since admission. Fortunately she's had some improvement in her fibromyalgia symptoms as her depression has improved.  Plan  I asked her to slowly titrate upwards of the gabapentin for her fibromyalgia. She will take 300 mg 3 times daily for one week and then increase by 300 mg weekly until she reaches 600 mg by mouth 3 times a week. We will also continue to treat her major depression with Prozac. We will reassess her fibromyalgia symptoms with the titrated dose of gabapentin and continued Prozac at the follow-up visit. At that point we will rewrite the Prozac prescription to the dose she has achieved.

## 2015-08-08 NOTE — Progress Notes (Signed)
   Subjective:    Patient ID: [name], female    DOB: 1950/03/01, 65 y.o.   MRN: [id number]  HPI  [name] is here for follow-up of her depression, diabetes, anemia, and anxiety. Please see the A&P for the status of the pt's chronic medical problems.  Review of Systems  Constitutional: Positive for activity change. Negative for unexpected weight change.       Has had more energy lately and therefore has gotten out of the house.  Respiratory: Positive for shortness of breath. Negative for chest tightness and wheezing.        Dyspnea at baseline  Gastrointestinal: Positive for diarrhea and constipation. Negative for nausea and vomiting.       Intermittent diarrhea and constipation depending on bowel regimen.  Musculoskeletal: Positive for back pain. Negative for joint swelling.  Skin: Negative for color change, pallor, rash and wound.  Neurological: Negative for syncope.  Psychiatric/Behavioral: Positive for sleep disturbance and dysphoric mood. Negative for suicidal ideas. The patient is nervous/anxious.        Depression is improved on the Prozac with a PHQ 9 down to 8 from 112 earlier in the month.      Objective:   Physical Exam  Constitutional: She is oriented to person, place, and time. She appears well-developed and well-nourished. No distress.  HENT:  Head: Normocephalic and atraumatic.  Eyes: Conjunctivae are normal. Right eye exhibits no discharge. Left eye exhibits no discharge. No scleral icterus.  Neurological: She is alert and oriented to person, place, and time. She exhibits normal muscle tone.  Skin: Skin is warm and dry. No rash noted. She is not diaphoretic. No erythema. No pallor.  Psychiatric: She has a normal mood and affect. Her behavior is normal. Judgment and thought content normal.  Mood much improved from previously.  Nursing note and vitals reviewed.     Assessment & Plan:   Please see problem oriented charting.

## 2015-08-08 NOTE — Assessment & Plan Note (Signed)
Assessment  She was recently admitted to hospital with symptomatic anemia. It was felt to be secondary to chronic GI blood loss although the evaluation with an EGD and capsule endoscopy were only notable for mild gastritis and occasional erosion of the small bowel and one nonbleeding AVM. Her symptoms resolved with transfusion. She has also restarted the oral iron therapy. She denies any of her previous symptoms that she had when she was anemic with regards to fatigue and worsening shortness of breath. She does admit to a return in her PICA over the last couple of days. Her INR today was 5.8.  Plan  A CBC was drawn today and is pending at the time of this dictation. A ferritin is also pending. She was encouraged to continue to use the iron supplementation. We did not have time to discuss the issue of anticoagulation and risks versus benefits given the 2 admissions over the past year for symptomatic anemia likely secondary to GI blood loss. Her CHAD-VASC 2 score was 5 which gives her a 6.7% risk of a cerebrovascular accident per year. This issue will be discussed at the follow-up visit. In the meantime, we will continue her warfarin therapy but adjust the dose downward to 2.5 mg daily with a repeat INR check in 1 week.

## 2015-08-09 LAB — CBC
Hematocrit: 32.5 % — ABNORMAL LOW (ref 34.0–46.6)
Hemoglobin: 10.3 g/dL — ABNORMAL LOW (ref 11.1–15.9)
MCH: 26.7 pg (ref 26.6–33.0)
MCHC: 31.7 g/dL (ref 31.5–35.7)
MCV: 84 fL (ref 79–97)
Platelets: 198 10*3/uL (ref 150–379)
RBC: 3.86 x10E6/uL (ref 3.77–5.28)
RDW: 22.5 % — ABNORMAL HIGH (ref 12.3–15.4)
WBC: 7.8 10*3/uL (ref 3.4–10.8)

## 2015-08-09 LAB — FERRITIN: Ferritin: 29 ng/mL (ref 15–150)

## 2015-08-09 NOTE — Progress Notes (Signed)
CBC: Hgb 10.3, Hct 32.5, ferritin 29  Stable Hct with resolution of iron deficiency after blood transfusions and oral iron supplementation.  We will continue the oral iron supplementation for now to build her stores further, especially as she is at risk for recurrent GI blood loss since she requires chronic anticoagulation.

## 2015-08-10 ENCOUNTER — Ambulatory Visit: Payer: PPO | Admitting: Pharmacist

## 2015-08-10 ENCOUNTER — Encounter: Payer: PPO | Admitting: Internal Medicine

## 2015-08-16 ENCOUNTER — Ambulatory Visit (INDEPENDENT_AMBULATORY_CARE_PROVIDER_SITE_OTHER): Payer: PPO | Admitting: Pharmacist

## 2015-08-16 ENCOUNTER — Telehealth: Payer: Self-pay | Admitting: *Deleted

## 2015-08-16 DIAGNOSIS — Z7901 Long term (current) use of anticoagulants: Secondary | ICD-10-CM | POA: Diagnosis not present

## 2015-08-16 DIAGNOSIS — I480 Paroxysmal atrial fibrillation: Secondary | ICD-10-CM | POA: Diagnosis not present

## 2015-08-16 LAB — POCT INR: INR: 2.9

## 2015-08-16 NOTE — Telephone Encounter (Signed)
Thank you.  I agree with the change made to the Xanax prescription.  I will await to hear from her about re-prescribing the gabapentin.  I am OK if she is provided with some TED Hose.  Is there anything else I specifically need to do?

## 2015-08-16 NOTE — Telephone Encounter (Signed)
No, she left happy.

## 2015-08-16 NOTE — Patient Instructions (Signed)
Patient educated about medication as defined in this encounter and verbalized understanding by repeating back instructions provided.   

## 2015-08-16 NOTE — Progress Notes (Signed)
Anticoagulation Management [name] is a 65 y.o. female who reports to the clinic for monitoring of warfarin treatment.    Indication: atrial fibrillation Duration: indefinite  Anticoagulation Clinic Visit History: Patient does not report signs/symptoms of bleeding or thromboembolism   Anticoagulation Episode Summary    Current INR goal 2.0-3.0  Next INR check 08/15/2015  INR from last check 5.8! (08/08/2015)  Most recent INR 2.9 (08/16/2015)  Weekly max dose   Target end date   INR check location [facility]  Preferred lab   Send INR reminders to [place]   Indications  Paroxysmal atrial fibrillation ([place]) [I48.0] Long term (current) use of anticoagulants [Z79.01]        Comments       Anticoagulation Care Providers    Provider Role Specialty Phone number   Lelon Perla, MD  Cardiology [phone]     ASSESSMENT Recent Results: The most recent result is correlated with 17.5 mg per week: Lab Results  Component Value Date   INR 2.9 08/16/2015   INR 5.8 08/08/2015   INR 2.1 07/12/2015   Anticoagulation Dosing: Anticoagulation Dose Instructions as of 08/08/2015      Total Sun Mon Tue Wed Thu Fri Sat   New Dose 17.5 mg 2.5 mg 2.5 mg 2.5 mg 2.5 mg 2.5 mg 2.5 mg 2.5 mg     (5 mg x 0.5)  (5 mg x 0.5)  (5 mg x 0.5)  (5 mg x 0.5)  (5 mg x 0.5)  (5 mg x 0.5)  (5 mg x 0.5)                         INR today: Therapeutic  PLAN Weekly dose was unchanged  Patient advised to contact clinic or seek medical attention if signs/symptoms of bleeding or thromboembolism occur.  Patient verbalized understanding by repeating back information and was advised to contact me if further medication-related questions arise. Patient was also provided an information handout.  Follow-up 4 weeks  Kim,Jennifer J

## 2015-08-16 NOTE — Telephone Encounter (Signed)
Pt walks in States that her xanax is not going to be delivered, called [facility] while pt was present, insurance will not pay with the present directions, the directions must equal the amount, so, per pt i gave them new directions of - take0.5- 1tablets (0.5 to 59m total) 2 times daily as needed for anxiety, may have 1 extra every Sunday = 64 per month, the pharmacist states the insurance will approve this. Also she states she was to get a new gabapentin script, she has enough to last for now and will let you know when she gets low, she needed the xanax for delivery early next week, if you would like this changed to a different way please let me know and i will call in, they must have the new script by this afternoon. She was quite persistent in needing this done. She also would like some new TED hose, that can be done down here  Made her next appt for 8/18 @ 1045 with you

## 2015-08-20 NOTE — Progress Notes (Addendum)
INTERNAL MEDICINE TEACHING ATTENDING ADDENDUM - Nischal Narendra M.D  Duration- indefinite, Indication- pAfib, INR- therapeutic. Agree with pharmacy recommendations as outlined in their note.      

## 2015-08-21 ENCOUNTER — Other Ambulatory Visit: Payer: Self-pay | Admitting: Internal Medicine

## 2015-08-21 DIAGNOSIS — F329 Major depressive disorder, single episode, unspecified: Secondary | ICD-10-CM

## 2015-08-21 DIAGNOSIS — K449 Diaphragmatic hernia without obstruction or gangrene: Principal | ICD-10-CM

## 2015-08-21 DIAGNOSIS — K219 Gastro-esophageal reflux disease without esophagitis: Secondary | ICD-10-CM

## 2015-08-23 ENCOUNTER — Ambulatory Visit
Admission: RE | Admit: 2015-08-23 | Discharge: 2015-08-23 | Disposition: A | Discharge status: Home / Self Care | Payer: PPO | Source: Ambulatory Visit | Attending: Interventional Radiology | Admitting: Interventional Radiology

## 2015-08-23 ENCOUNTER — Encounter (HOSPITAL_COMMUNITY): Payer: Self-pay

## 2015-08-23 ENCOUNTER — Telehealth: Payer: Self-pay

## 2015-08-23 ENCOUNTER — Ambulatory Visit (HOSPITAL_COMMUNITY)
Admission: RE | Admit: 2015-08-23 | Discharge: 2015-08-23 | Disposition: A | Discharge status: Home / Self Care | Payer: PPO | Source: Ambulatory Visit | Attending: Interventional Radiology | Admitting: Interventional Radiology

## 2015-08-23 DIAGNOSIS — Z9889 Other specified postprocedural states: Secondary | ICD-10-CM | POA: Diagnosis not present

## 2015-08-23 DIAGNOSIS — R52 Pain, unspecified: Secondary | ICD-10-CM | POA: Diagnosis not present

## 2015-08-23 DIAGNOSIS — C642 Malignant neoplasm of left kidney, except renal pelvis: Secondary | ICD-10-CM | POA: Insufficient documentation

## 2015-08-23 DIAGNOSIS — K760 Fatty (change of) liver, not elsewhere classified: Secondary | ICD-10-CM | POA: Diagnosis not present

## 2015-08-23 DIAGNOSIS — I700 Atherosclerosis of aorta: Secondary | ICD-10-CM | POA: Insufficient documentation

## 2015-08-23 DIAGNOSIS — N200 Calculus of kidney: Secondary | ICD-10-CM | POA: Diagnosis not present

## 2015-08-23 HISTORY — PX: IR GENERIC HISTORICAL: IMG1180011

## 2015-08-23 LAB — POCT I-STAT CREATININE: Creatinine, Ser: 0.8 mg/dL (ref 0.44–1.00)

## 2015-08-23 MED ORDER — IOPAMIDOL (ISOVUE-300) INJECTION 61%
100.0000 mL | Freq: Once | INTRAVENOUS | Status: AC | PRN
  Administered 2015-08-23: 100 mL via INTRAVENOUS

## 2015-08-23 NOTE — Telephone Encounter (Signed)
Have called 2x lm for rtc

## 2015-08-23 NOTE — Telephone Encounter (Signed)
Called once again, got v,ail lm again

## 2015-08-23 NOTE — Telephone Encounter (Signed)
Requesting to speak with Bonnita Nasuti.

## 2015-08-23 NOTE — Telephone Encounter (Signed)
Please call pt back.

## 2015-08-24 NOTE — Telephone Encounter (Signed)
Spoke w/ pt, she had a fall yesterday from the shower into the bathroom floor, she had her CT afterwards and told the staff, states she was not seriously hurt, just achy today

## 2015-08-29 NOTE — Progress Notes (Signed)
Chief Complaint: Status post percutaneous thermal ablation of a left renal clear cell carcinoma on 09/19/2011.  History of Present Illness: [name] is a 65 y.o. female returning for 4 year follow-up after ablation of a left renal carcinoma. The patient has no complaints currently.  Past Medical History  Diagnosis Date  . COPD (chronic obstructive pulmonary disease) with emphysema (HCC)     PFTs 02/2012: FEV1 0.92 (40%), ratio 69, 27% increase in FEV1 with BD, TLC 91%, severe airtrapping, DLCO49% On chronic home O2. Pulmonary rehab referral 05/2012   . Moderate to severe pulmonary hypertension ([place])     2014 TEE w PA peak pressure 46 mmHg, s/p MV replacement   . Tobacco abuse 07/28/2012  . Hyperlipidemia LDL goal < 100 11/20/2005  . Carotid artery stenosis     s/p right endarterectomy (06/2010) Carotid US (07/2010):  Left: Moderate-to-severe (60-79%) calcific and non-calcific plaque origin and proximal ICA and ECA   . Mitral stenosis     s/p Mitral valve replacement with a 27-mm pericardial porcine valve (Medtronic Mosaic valve, serial #[id number] on 09/20/10, Dr. Prescott Gum)   . Chronic congestive heart failure with left ventricular diastolic dysfunction ([place]) 10/21/2010  . Depression 11/19/2005  . Anxiety 07/24/2010  . Fibromyalgia 08/29/2010  . Obesity (BMI 30.0-34.9) 10/23/2011  . Adenomatous polyps 05/14/2011    Colonoscopy (05/2011): 4 mm adenomatous polyp excised endoscopically Colonoscopy (02/2002): Adenomatous polyp excised endoscopically   . Gastroesophageal reflux disease   . Internal hemorrhoids 08/04/2012  . Osteoporosis     DEXA (12/09/2011): L-spine T -3.7, left hip T -1.4 DEXA (12/2004): L-spine T -2.6, left hip -0.1   . Allergic rhinitis 06/01/2012  . Bilateral cataracts 08/04/2012    Visually insignificant   . Chronic constipation 02/03/2011  . Chronic venous insufficiency 08/04/2012  . Paroxysmal atrial fibrillation ([place]) 10/22/2010    s/p Left atrial maze procedure  for paroxysmal atrial fibrillation on 09/20/2010 by Dr Prescott Gum.  Subsequent splenic infarct, decision was made to re-anticoagulate with coumadin, likely life-long as this is the most likely cause of the splenic infarct.   . Chronic low back pain 10/06/2012  . Anemia of chronic disease 01/01/2013  . Obstructive sleep apnea     Nocturnal polysomnography (06/2009): Moderate sleep apnea/ hypopnea syndrome , AHI 17.8 per hour with nonpositional hypopneas. CPAP titration to 12 CWP, AHI 2.4 per hour. On nocturnal CPAP via a small resMed Quattro full-face mask with heated humidifier.   . Type 2 diabetes mellitus with diabetic neuropathy ([place])   . History of blood transfusion     "several times"   . Clear cell renal cell carcinoma ([place]) 07/21/2011    s/p cryoablation of left RCC in 09/2011 by [name]. Followed by [name]  [facility]) .    Marland Kitchen Chronic daily headache 01/16/2014  . Right nephrolithiasis 09/06/2014    5 mm non-obstructing calculus seen on CT scan 09/05/2014   . Aortic atherosclerosis ([place]) 10/19/2014    Seen on CT scan, currently asymptomatic  . Shortness of breath dyspnea   . Esophageal stenosis 08/08/2015    Mild, benign-appearing on EGD 06/29/2015  . Arteriovenous malformation of gastrointestinal tract 08/08/2015    Non-bleeding when visualized on capsule endoscopy 06/30/2015     Past Surgical History  Procedure Laterality Date  . Orif clavicle fracture  01/2004    by Thana Farr. [name], M.D for Right clavicle nonunion.  . Tonsillectomy    . Carotid endarterectomy Right 07/04/2010  by [name] for asymptomatic right carotid artery stenosis  . Lipoma excision  08/2005    occipital lipoma 1.5cm - by Dr. [name]  . Hysteroscopy with endometrial ablation  06/2001    for persistent post-menopausal bleeding // by S. [name], M.D.  . Mitral valve replacement  09/20/10     with a 27-mm pericardial porcine valve (Medtronic Mosaic valve, serial #[id number]). 09/20/10, Dr Prescott Gum  . Maze Left 09/20/10    for paroxysmal atrial fibrillation (Dr. Prescott Gum)  . Chest tube insertion  09/24/2010    Dr Prescott Gum  . Cardiac valve replacement  Aug. 2012    "mitral valve"  . Colonoscopy  05/12/2011    performed by Dr. Michail Sermon. Showing small internal hemorrhoids, single tubular adenoma polyp  . Esophagogastroduodenoscopy  05/12/2011    performed by Dr. Michail Sermon. Negative for ulcerations, biopsy negative for evidence of celiac sprue  . Tubal ligation    . Fracture surgery Right 2005    clavicle  . Dilation and curettage of uterus    . Cryoablation Left 09/2011    by [name]. Followed by [name]  [facility]) .    Marland Kitchen Givens capsule study N/A 06/30/2015    Procedure: GIVENS CAPSULE STUDY;  Surgeon: Clarene Essex, MD;  Location: [facility] ENDOSCOPY;  Service: Endoscopy;  Laterality: N/A;  . Esophagogastroduodenoscopy N/A 06/29/2015    Procedure: ESOPHAGOGASTRODUODENOSCOPY (EGD);  Surgeon: Clarene Essex, MD;  Location: [facility] ENDOSCOPY;  Service: Endoscopy;  Laterality: N/A;  . Givens capsule study N/A 06/29/2015    Procedure: GIVENS CAPSULE STUDY;  Surgeon: Clarene Essex, MD;  Location[facility];  Service: Endoscopy;  Laterality: N/A;    Allergies: Morphine and related; Oxycontin; Tramadol hcl; and Lorazepam  Medications: Prior to Admission medications   Medication Sig Start Date End Date Taking? Authorizing Provider  albuterol (PROAIR HFA) 108 (90 BASE) MCG/ACT inhaler Inhale 1-2 puffs into the lungs every 6 (six) hours as needed for wheezing or shortness of breath. 12/13/14   [name], MD  alendronate (FOSAMAX) 70 MG tablet Take 1 tablet (70 mg total) by mouth once a week. 03/09/15   [name], MD  ALPRAZolam [name]) 1 MG tablet Take 0.5-1 tablets (0.5-1 mg total) by mouth 2 (two) times daily as needed for anxiety. 08/08/15   [name], MD  Blood Glucose Monitoring Suppl (ONETOUCH VERIO) W/DEVICE KIT 1 each by Does not apply route 4 (four) times daily. 07/25/14    [name], MD  chlorpheniramine (CHLOR-TRIMETON) 4 MG tablet Take 1 tablet (4 mg total) by mouth 3 (three) times daily as needed for allergies. 10/06/14   [name], MD  EASY TOUCH PEN NEEDLES 31G X 5 MM MISC USE TO INJECT INSULIN 5 TIMES DAILY 07/26/15   [name], MD  ferrous gluconate (FERGON) 324 MG tablet Take 1 tablet (324 mg total) by mouth daily with breakfast. 07/03/15   Jule Ser, DO  FLUoxetine (PROZAC) 20 MG tablet Take 1 tablet (20 mg total) by mouth daily. 08/22/15   [name], MD  fluticasone (FLONASE) 50 MCG/ACT nasal spray Place 2 sprays into both nostrils daily. 02/08/15   [name], MD  Fluticasone-Salmeterol (ADVAIR DISKUS) 250-50 MCG/DOSE AEPB Inhale 1 puff into the lungs 2 (two) times daily. 05/03/15   [name], MD  furosemide (LASIX) 80 MG tablet Take 1 tablet (80 mg total) by mouth 2 (two) times daily. 06/28/15   [name], MD  gabapentin (NEURONTIN) 300 MG capsule Take 1 capsule (300 mg  total) by mouth 3 (three) times daily. 10/06/14   [name], MD  HYDROcodone-acetaminophen (NORCO/VICODIN) 5-325 MG tablet Take 1-2 tablets by mouth every 6 (six) hours as needed for severe pain. 04/06/15   [name], MD  insulin aspart (NOVOLOG FLEXPEN) 100 UNIT/ML FlexPen Inject 18 Units into the skin 3 (three) times daily with meals. 01/10/15   [name], MD  Insulin Detemir (LEVEMIR FLEXTOUCH) 100 UNIT/ML Pen Inject 45 Units into the skin 2 (two) times daily. 03/09/15   [name], MD  Lancets Misc. (ACCU-CHEK FASTCLIX LANCET) KIT Check your blood 4 times a day dx code 250.00 insulin requiring Patient not taking: Reported on 06/27/2015 01/19/13   [name], MD  lidocaine (XYLOCAINE) 2 % jelly APPLY TO AFFECTED AREA THREE TIMES A DAY 10/18/14   [name], MD  nystatin cream (MYCOSTATIN) Apply topically 2 (two) times daily. Patient not taking: Reported on 06/27/2015 05/31/15   [name], MD  omeprazole (PRILOSEC) 40 MG capsule  Take 1 capsule (40 mg total) by mouth 2 (two) times daily. 08/22/15   [name], MD  [facility] VERIO test strip Check blood sugar up to 4 times a day Patient not taking: Reported on 06/27/2015 02/28/15   [name], MD  potassium chloride SA (K-DUR,KLOR-CON) 20 MEQ tablet Take 1 tablet (20 mEq total) by mouth 2 (two) times daily. 10/18/14   [name], MD  promethazine (PHENERGAN) 12.5 MG tablet Take 1 tablet (12.5 mg total) by mouth every 8 (eight) hours as needed for nausea or vomiting. 07/13/15   [name], DO  rosuvastatin (CRESTOR) 20 MG tablet Take 1 tablet (20 mg total) by mouth at bedtime. 10/12/14   [name], MD  tiotropium (SPIRIVA HANDIHALER) 18 MCG inhalation capsule Place 1 capsule (18 mcg total) into inhaler and inhale daily. 10/12/14   [name], MD  warfarin (COUMADIN) 5 MG tablet Take 29m daily and follow up with the [facility] on May 25 at 10:00am 07/03/15   AJule Ser DO     Family History  Problem Relation Age of Onset  . Peptic Ulcer Disease Father   . Heart attack Father 541   Died of MI at age 26824 . Heart attack Brother 549   Died of MI at age 26860 . Obesity Brother   . Pneumonia Mother   . Healthy Sister   . Lupus Daughter   . Obsessive Compulsive Disorder Daughter     Social History   Social History  . Marital Status: Divorced    Spouse Name: N/A  . Number of Children: N/A  . Years of Education: N/A   Social History Main Topics  . Smoking status: Current Every Day Smoker -- 1.00 packs/day for 49 years    Types: Cigarettes    Start date: 10/31/2013  . Smokeless tobacco: Never Used     Comment: 1 1/2  PPD down to 1 ppd  . Alcohol Use: No  . Drug Use: No  . Sexual Activity: No   Other Topics Concern  . Not on file   Social History Narrative   [place] with 60 pound chow   Worked at [employer] years   No car    ECOG Status: 0 - Asymptomatic  Review of Systems: A 12 point  ROS discussed and pertinent positives are indicated in the HPI above.  All other systems are negative.  Review of Systems  Constitutional: Negative.  Respiratory: Negative.   Cardiovascular: Negative.   Genitourinary: Negative.   Musculoskeletal: Negative.   Neurological: Negative.      Vital Signs: BP 136/71 mmHg  Pulse 99  Temp(Src) 98.3 F (36.8 C) (Oral)  Resp 16  Ht _0  (1.575 m)  Wt 185 lb (83.915 kg)  BMI 33.83 kg/m2  SpO2 93%  Physical Exam  Constitutional: She appears well-developed and well-nourished. No distress.  Abdominal: Soft. She exhibits no distension. There is no tenderness. There is no rebound and no guarding.  Neurological: She is alert.  Skin: She is not diaphoretic.  Nursing note and vitals reviewed.    Imaging: Ct Abd Wo & W Cm  08/23/2015  CLINICAL DATA:  65 year old female with history of clear cell carcinoma of the left kidney originally diagnosed in August 2013, status post renal ablation. Follow-up study. Nausea for 1 day. EXAM: CT ABDOMEN WITHOUT AND WITH CONTRAST TECHNIQUE: Multidetector CT imaging of the abdomen was performed following the standard protocol before and following the bolus administration of intravenous contrast. CONTRAST:  167m ISOVUE-300 IOPAMIDOL (ISOVUE-300) INJECTION 61% COMPARISON:  CT the abdomen 09/05/2014, and multiple other prior examinations. FINDINGS: Lower chest: Mild emphysematous changes are noted throughout the visualize lung bases. Postoperative changes of mitral valve replacement (a stented bio prosthesis) are noted. Hepatobiliary: Heterogeneous areas of mild decreased attenuation are noted in the hepatic parenchyma, compatible with mild heterogeneous hepatic steatosis. No cystic or solid hepatic lesions. No intra or extrahepatic biliary ductal dilatation. Tiny calcified gallstones lying dependently in the gallbladder. No findings to suggest an acute cholecystitis at this time. Pancreas: No pancreatic mass. No  pancreatic ductal dilatation. No pancreatic or peripancreatic fluid or inflammatory changes. Spleen: Unremarkable. Adrenals/Urinary Tract: Post ablation changes are again noted in the posterior aspect of the lower pole of left kidney and in the adjacent retroperitoneal fat. The appearance of this region is generally typical for evolving post ablation changes, with a mixture of fatty attenuation and some soft tissue attenuation, generally stable compared to the prior study. The exception is a small soft tissue nodule along the posterolateral aspect of this ablation area, best appreciated on axial image 54 of series 4 where this area measures 1.2 x 1.3 cm. This area of concern measures 7 HU on precontrast images, 9 HU on arterial phase images, but increases to 30 HU on delayed phase images. No other suspicious findings are noted. 5 mm nonobstructive calculus noted in the lower pole collecting system of the right kidney (unchanged). Sub cm low-attenuation lesions in the kidneys bilaterally are too small to definitively characterize, but are very similar to the prior study, likely to represent tiny cysts. 11 mm simple cyst in the interpolar region of the right kidney is also noted. No hydroureteronephrosis in the visualized abdomen. Mildly nodular contour of the left adrenal gland is unchanged over several prior examinations, presumably related to mild adrenal hyperplasia. Right adrenal gland is normal in appearance. Stomach/Bowel: Normal appearance of the stomach. No pathologic dilatation of the visualized portions of small bowel or colon. Vascular/Lymphatic: Aortic atherosclerosis, in addition to extensive calcified atherosclerotic plaque throughout the other abdominal vasculature, without evidence of aneurysm or dissection. Single renal arteries bilaterally. No lymphadenopathy noted in the abdomen or pelvis. Other: No significant volume of ascites in the visualized peritoneal cavity. No pneumoperitoneum.  Musculoskeletal: There are no aggressive appearing lytic or blastic lesions noted in the visualized portions of the skeleton. IMPRESSION: 1. The ablation defect in the lower pole of the left kidney is  generally stable in appearance compared to the prior examinations, with the notable exception of a small area of soft tissue nodularity along the posterolateral margin of the region, as detailed above. This is favored to reflect some scar tissue given the low level delayed enhancement (rather than arterial phase enhancement as would be seen in the setting of local recurrence), however, close attention on followup studies is recommended. Decrease time interval between today's examination and the follow-up is suggested, specifically, a repeat CT of the abdomen is recommended in 6 months. 2. Mild heterogeneous hepatic steatosis. 3. Aortic atherosclerosis. 4. Nonobstructive 5 mm calculus in the lower pole collecting system of the right kidney. 5. Additional incidental findings, as above. These results were called by telephone at the time of interpretation on 08/23/2015 at 4:53 pm to Dr. Aletta Edouard, who verbally acknowledged these results. Electronically Signed   By: Vinnie Langton M.D.   On: 08/23/2015 17:00    Labs:  CBC:  Recent Labs  06/30/15 0256 07/01/15 0455 07/02/15 0237 07/03/15 0258 07/12/15 1128 08/08/15 1441  WBC 7.2 8.5 9.2 9.6 5.2 7.8  HGB 7.3* 7.9* 8.1* 8.3*  --   --   HCT 24.5* 27.0* 28.3* 28.5* 33.7* 32.5*  PLT 237 237 226 230 294 198    COAGS:  Recent Labs  07/05/15 1056 07/12/15 1138 08/08/15 1546 08/16/15 1639  INR 1.3 2.1 5.8 2.9    BMP:  Recent Labs  06/29/15 0439 07/01/15 0455 07/03/15 0258 07/12/15 1128 08/23/15 1325  NA 140 139 138 144  --   K 4.1 3.8 3.1* 4.2  --   CL 108 108 98* 105  --   CO2 26 23 32 22  --   GLUCOSE 157* 187* 230* 144*  --   BUN _0 --   CALCIUM 8.3* 8.5* 8.6* 9.0  --   CREATININE 0.86 0.88 0.83 0.66 0.80  GFRNONAA >60  >60 >60 92  --   GFRAA >60 >60 >60 106  --     LIVER FUNCTION TESTS:  Recent Labs  12/27/14 0258 06/28/15 0241  BILITOT 0.8 1.5*  AST 15 79*  ALT 13* 72*  ALKPHOS 54 84  PROT 5.9* 5.6*  ALBUMIN 3.2* 2.7*    Assessment and Plan:  Renal function is stable and normal. Follow-up CT performed earlier today demonstrates a new small area of soft tissue nodularity along the posterolateral margin of prior renal ablation.  I measured this region at approximately 11 mm in diameter. Low-level enhancement on a delayed fashion may be consistent with scar tissue, but subtle tumor recurrence cannot be completely excluded. I reviewed these findings with Mrs. Earlywine and her sister and recommended that we follow-up with another CT scan in 6 months rather than 12 months to recheck this area of new soft tissue nodularity.  Electronically SignedAletta Edouard T 08/29/2015, 1:08 PM   I spent a total of 15 Minutes in face to face in clinical consultation, greater than 50% of which was counseling/coordinating care post left renal carcinoma ablation.

## 2015-09-04 ENCOUNTER — Encounter: Payer: Self-pay | Admitting: Internal Medicine

## 2015-09-07 ENCOUNTER — Telehealth: Payer: Self-pay | Admitting: Dietician

## 2015-09-07 DIAGNOSIS — E1140 Type 2 diabetes mellitus with diabetic neuropathy, unspecified: Secondary | ICD-10-CM

## 2015-09-07 DIAGNOSIS — Z794 Long term (current) use of insulin: Principal | ICD-10-CM

## 2015-09-07 NOTE — Telephone Encounter (Signed)
Patient calls to reschedule her appointment to one before her doctor's appointment.  That appointment is to be trained to use the VGo insulin patch.  She'd like to start the VGO  at that appointment if okay with her doctor. She will need an order for the VGo (suggest the VGO 20) and directions (how many clicks for meal/snacks)  along with 2 vials rapid acting insulin.

## 2015-09-10 ENCOUNTER — Encounter: Payer: Self-pay | Admitting: Dietician

## 2015-09-10 NOTE — Addendum Note (Signed)
Addended by: Resa Miner on: 09/10/2015 03:25 PM   Modules accepted: Orders

## 2015-09-10 NOTE — Telephone Encounter (Addendum)
[place] people called her and said [employer] will supply the VGO, but they still have not told her her cost.  We discussed that she is not to take Levemir after 12 PM (noon) on Monday and no Novolog after 11 AM on Tuesday. She verbalized understanding.  Will send her a reminder via [place].

## 2015-09-18 ENCOUNTER — Other Ambulatory Visit: Payer: Self-pay | Admitting: Internal Medicine

## 2015-09-18 ENCOUNTER — Ambulatory Visit (INDEPENDENT_AMBULATORY_CARE_PROVIDER_SITE_OTHER): Payer: PPO | Admitting: Dietician

## 2015-09-18 DIAGNOSIS — E1140 Type 2 diabetes mellitus with diabetic neuropathy, unspecified: Secondary | ICD-10-CM

## 2015-09-18 DIAGNOSIS — B3731 Acute candidiasis of vulva and vagina: Secondary | ICD-10-CM

## 2015-09-18 DIAGNOSIS — E785 Hyperlipidemia, unspecified: Secondary | ICD-10-CM

## 2015-09-18 DIAGNOSIS — Z713 Dietary counseling and surveillance: Secondary | ICD-10-CM

## 2015-09-18 DIAGNOSIS — I480 Paroxysmal atrial fibrillation: Secondary | ICD-10-CM

## 2015-09-18 DIAGNOSIS — I5032 Chronic diastolic (congestive) heart failure: Secondary | ICD-10-CM

## 2015-09-18 DIAGNOSIS — B373 Candidiasis of vulva and vagina: Secondary | ICD-10-CM

## 2015-09-18 DIAGNOSIS — J439 Emphysema, unspecified: Secondary | ICD-10-CM

## 2015-09-18 DIAGNOSIS — E1165 Type 2 diabetes mellitus with hyperglycemia: Secondary | ICD-10-CM

## 2015-09-18 DIAGNOSIS — Z794 Long term (current) use of insulin: Secondary | ICD-10-CM

## 2015-09-18 MED ORDER — INSULIN ASPART 100 UNIT/ML ~~LOC~~ SOLN: SUBCUTANEOUS | 5 refills | Status: DC

## 2015-09-18 MED ORDER — V-GO 20 KIT: PACK | 5 refills | Status: DC

## 2015-09-18 NOTE — Progress Notes (Signed)
Diabetes Self Management Training  Appointment start time: 1500 Appointment end time: 1600  DSMT/E Educational plan: education and training on VgO patch Goal: Take my medicine as directed (fill and wear Vgo daily, take clicks as directed)  achievement of  Goal at start- 0%  DSMS: [place] toll free support line, CDE, Doctor, Triage nurse, Sister  Patient was instructed on how to fill,  apply and use the Vgo insulin patch using aseptic technique today. She was instructed to stop her levemir insulin and only use Novolog insulin to fill the Vgo. She was instructed to apply the VGo at the same time erach day and to take 3 clicks with each meal and 1 click with each snack.  A month supply of Vgo20 Kit was given  to patient along with 1 sample vial of Novolog. Using teach back patient demonstrated understanding of use, fill, application and storage of VGo patch and EZ fill by filling and applying her first VGo patch here int he office.    Patient's  blood sugar was 340 at start of VGo today, She had had coffee, and a banana and no Novolog today or Levemir since yesterday. She was asked to check her blood sugar in 1-2 hours after starting the VGO and then two more times today. Tomorrow she was asked to begin checking her blood sugar before each meal and bedtime 4 times a day.

## 2015-09-18 NOTE — Patient Instructions (Signed)
I will call you tonight between 7PM and 8 PM to see how your blood sugars is doing.   Please call me or [facility] 24/7 if you have questions.   Remove today's VGo about 3 PM tomorrow and apply a new one that you have filled.   You should be hearing from [facility] about next month's supply of Vgo and more Novolog insulin vials.

## 2015-09-18 NOTE — Addendum Note (Signed)
Addended by: Gilles Chiquito B on: 09/18/2015 02:43 PM   Modules accepted: Orders

## 2015-09-19 ENCOUNTER — Other Ambulatory Visit: Payer: Self-pay | Admitting: Internal Medicine

## 2015-09-19 ENCOUNTER — Encounter: Payer: Self-pay | Admitting: Dietician

## 2015-09-19 ENCOUNTER — Telehealth: Payer: Self-pay | Admitting: Dietician

## 2015-09-19 DIAGNOSIS — S2231XS Fracture of one rib, right side, sequela: Secondary | ICD-10-CM

## 2015-09-19 NOTE — Telephone Encounter (Signed)
Appears based on last coumadin clinic note she is supposed to be on 2.52m BID.  Will change Rx to reflect this.

## 2015-09-19 NOTE — Telephone Encounter (Signed)
Called patient last night about 730 PM. Her Vgo was doing fine. Blood sugars: yesterday 343 at 315 Pm before she put the vgo on  took 3 clicks about 435 PM,    CBG was 279 at  730 Pm  Has not changed her VGo yet today. She feels confident that she will be abel to fill and apply a new one. CBG was 302 at 1025 last night, she had a  Snack and took 1 click ( ? Time), Ate a meal at 391 am -took 3 clicks Today her CBG was 262 fasting at noon, 130 ate and took 3 clicks. She asked if she should increase her clicks if she knows she is eating a larger snack or meal. Encouraged her to not increase her clicks for now until we know how the Vgo is doing and to record the number of clicks she takes over the course of the day. We agreed to talk again on Friday

## 2015-09-20 ENCOUNTER — Telehealth: Payer: Self-pay | Admitting: Dietician

## 2015-09-20 DIAGNOSIS — E1140 Type 2 diabetes mellitus with diabetic neuropathy, unspecified: Secondary | ICD-10-CM

## 2015-09-20 DIAGNOSIS — Z794 Long term (current) use of insulin: Principal | ICD-10-CM

## 2015-09-20 NOTE — Telephone Encounter (Signed)
[name] called to let us know that [facility] needs a Prior Authorization for the VGO and that they faxed that to [place].   She also reported blood sugars:  Changed her Vgo yesterday at 4 PM-  At 482 PM took 1 click for a snack CBG was 322 at 822 PM 11 Pm took 4 clicks for a meal At 3AM CBG was 707 so she took 2 clicks for a snack At 6am CBG was 867 she took 3 clicks for 45 grams carb Her CBG was 289 at 1:20 PM today  We agreed to talk again tomorrow about this time and that she may need to use the VGo30 for improved blood sugars.

## 2015-09-21 NOTE — Telephone Encounter (Signed)
Putting VGo on now- says it is not hard at all. Confused about sugars 207 this am fasting ate 1 hour later took 4 clicks for a meal  Back up to 312 at 4 Pm. Per her records She took 11 clicks yesterday.  She plans to take 4 clicks with meals and 2 clicks for snacks from now on.   She agrees she might do better with the VGo30 that provides 30 units basal insulin/day.   If Dr.Klima agrees, please order the VGo30 instead of the Vgo20 so that when prior authorization goes through her pharmacy will deliver the right one.  I have a sample of the Vgo30 that she can have while she is waiting for hers to arrive.

## 2015-09-21 NOTE — Addendum Note (Signed)
Addended by: Resa Miner on: 09/21/2015 04:29 PM   Modules accepted: Orders

## 2015-09-21 NOTE — Telephone Encounter (Signed)
Thanks for the update

## 2015-09-24 NOTE — Telephone Encounter (Signed)
09/21/2015-Attempted to submit PA request to pt's insurance-after call was transferred 4 times to different depts, I was informed that insurance would require a DME pre-auth for pt's V-Go (disposable insulin delivery system).  [employer] faxed me the appropriate form and it was completed and faxed back to insurance on 09/21/15.    09/24/2015--A f/u call was made to pt's insurance and I was informed that paperwork was missing [place] code and clinical notes.  After finding the code online [phone]) and form was faxed back with clinical notes to the fax number provided [phone]). Device pending approval.[place], Darlene Cassady8/14/201711:36 AM

## 2015-09-25 ENCOUNTER — Encounter: Payer: Self-pay | Admitting: Internal Medicine

## 2015-09-25 DIAGNOSIS — K8020 Calculus of gallbladder without cholecystitis without obstruction: Secondary | ICD-10-CM | POA: Insufficient documentation

## 2015-09-25 HISTORY — DX: Calculus of gallbladder without cholecystitis without obstruction: K80.20

## 2015-09-27 ENCOUNTER — Telehealth: Payer: Self-pay | Admitting: Internal Medicine

## 2015-09-27 NOTE — Telephone Encounter (Signed)
APT. REMINDER CALL, LMTCB °

## 2015-09-28 ENCOUNTER — Ambulatory Visit (INDEPENDENT_AMBULATORY_CARE_PROVIDER_SITE_OTHER): Payer: PPO | Admitting: Internal Medicine

## 2015-09-28 ENCOUNTER — Encounter: Payer: Self-pay | Admitting: Internal Medicine

## 2015-09-28 VITALS — BP 108/39 | HR 99 | Temp 98.5°F | Wt 189.0 lb

## 2015-09-28 DIAGNOSIS — E1140 Type 2 diabetes mellitus with diabetic neuropathy, unspecified: Secondary | ICD-10-CM

## 2015-09-28 DIAGNOSIS — K5520 Angiodysplasia of colon without hemorrhage: Secondary | ICD-10-CM

## 2015-09-28 DIAGNOSIS — L988 Other specified disorders of the skin and subcutaneous tissue: Secondary | ICD-10-CM

## 2015-09-28 DIAGNOSIS — Z794 Long term (current) use of insulin: Secondary | ICD-10-CM

## 2015-09-28 DIAGNOSIS — C642 Malignant neoplasm of left kidney, except renal pelvis: Secondary | ICD-10-CM

## 2015-09-28 DIAGNOSIS — F329 Major depressive disorder, single episode, unspecified: Secondary | ICD-10-CM

## 2015-09-28 DIAGNOSIS — I5032 Chronic diastolic (congestive) heart failure: Secondary | ICD-10-CM

## 2015-09-28 DIAGNOSIS — I480 Paroxysmal atrial fibrillation: Secondary | ICD-10-CM | POA: Diagnosis not present

## 2015-09-28 DIAGNOSIS — C649 Malignant neoplasm of unspecified kidney, except renal pelvis: Secondary | ICD-10-CM

## 2015-09-28 DIAGNOSIS — Z5181 Encounter for therapeutic drug level monitoring: Secondary | ICD-10-CM

## 2015-09-28 DIAGNOSIS — Z79899 Other long term (current) drug therapy: Secondary | ICD-10-CM

## 2015-09-28 DIAGNOSIS — Z0000 Encounter for general adult medical examination without abnormal findings: Secondary | ICD-10-CM

## 2015-09-28 DIAGNOSIS — I700 Atherosclerosis of aorta: Secondary | ICD-10-CM | POA: Diagnosis not present

## 2015-09-28 DIAGNOSIS — M545 Low back pain, unspecified: Secondary | ICD-10-CM

## 2015-09-28 DIAGNOSIS — Z7901 Long term (current) use of anticoagulants: Secondary | ICD-10-CM | POA: Diagnosis not present

## 2015-09-28 DIAGNOSIS — L821 Other seborrheic keratosis: Secondary | ICD-10-CM | POA: Diagnosis not present

## 2015-09-28 DIAGNOSIS — M797 Fibromyalgia: Secondary | ICD-10-CM

## 2015-09-28 DIAGNOSIS — F17210 Nicotine dependence, cigarettes, uncomplicated: Secondary | ICD-10-CM

## 2015-09-28 DIAGNOSIS — Z872 Personal history of diseases of the skin and subcutaneous tissue: Secondary | ICD-10-CM

## 2015-09-28 DIAGNOSIS — G8929 Other chronic pain: Secondary | ICD-10-CM

## 2015-09-28 DIAGNOSIS — E785 Hyperlipidemia, unspecified: Secondary | ICD-10-CM

## 2015-09-28 DIAGNOSIS — Q2733 Arteriovenous malformation of digestive system vessel: Secondary | ICD-10-CM | POA: Diagnosis not present

## 2015-09-28 DIAGNOSIS — Z720 Tobacco use: Secondary | ICD-10-CM

## 2015-09-28 HISTORY — DX: Personal history of diseases of the skin and subcutaneous tissue: Z87.2

## 2015-09-28 LAB — GLUCOSE, CAPILLARY: Glucose-Capillary: 249 mg/dL — ABNORMAL HIGH (ref 65–99)

## 2015-09-28 LAB — POCT INR: INR: 2.1

## 2015-09-28 LAB — POCT GLYCOSYLATED HEMOGLOBIN (HGB A1C): Hemoglobin A1C: 8

## 2015-09-28 NOTE — Assessment & Plan Note (Signed)
Assessment  Since restarting the gabapentin her fibromyalgia symptoms are improved.  Plan  We will continue the gabapentin at the current dose and reassess her fibromyalgia at the follow-up visit.

## 2015-09-28 NOTE — Patient Instructions (Signed)
It was good to see you again.  1) We froze some of your skin lesions.  Remind me each time you come and we will give you the treatment.  2) We put in for a mammogram.  3) We drew some blood from you.  We will call early next week with the results, sooner if needed.  4) We started the VGo 30.  5) Keep taking the other medications as you are.  I will see you in 3 months, sooner if necessary.

## 2015-09-28 NOTE — Progress Notes (Signed)
Subjective:    Patient ID: [name], female    DOB: 1950-04-01, 65 y.o.   MRN: [id number]  HPI  YESICA KEMLER is here for follow-up of her diabetes, chronic back pain, anemia secondary to chronic GI blood loss, and depression. Please see the A&P for the status of the pt's chronic medical problems.  Review of Systems  Constitutional: Positive for fatigue. Negative for activity change, appetite change and unexpected weight change.  Respiratory: Positive for shortness of breath. Negative for wheezing.   Cardiovascular: Negative for chest pain, palpitations and leg swelling.  Musculoskeletal: Positive for arthralgias, back pain and gait problem. Negative for joint swelling.  Skin: Positive for color change. Negative for rash.       Redness over anterior shins bilaterally  Psychiatric/Behavioral: Positive for dysphoric mood.      Objective:   Physical Exam  Constitutional: She is oriented to person, place, and time. She appears well-developed and well-nourished.  HENT:  Head: Normocephalic and atraumatic.  Eyes: Conjunctivae are normal. Right eye exhibits no discharge. Left eye exhibits no discharge. No scleral icterus.  Musculoskeletal: Normal range of motion. She exhibits no edema or tenderness.  Neurological: She is alert and oriented to person, place, and time. She exhibits normal muscle tone.  Skin: Skin is warm and dry. No rash noted. There is erythema.  Two possible AKs on left leg and three SKs on upper chest, LLQ of abdomen, and under the left breast.  Psychiatric: She has a normal mood and affect. Her behavior is normal. Judgment and thought content normal.  Nursing note and vitals reviewed.     Assessment & Plan:   Please see problem oriented charting.

## 2015-09-28 NOTE — Assessment & Plan Note (Signed)
Assessment  Over the last couple of weeks she has switched to the [place] system. Although she loves the system, her sugars have deteriorated. She is therefore being switched to the Vgo30 as of today to assess for an improvement in her glycemic control. Her hemoglobin A1c, not unsurprisingly, deteriorated from 6.0 to 8.0. Her diabetic neuropathy symptoms are stable with the reinitiation of the gabapentin.  Plan  She is starting the Vgo30 and will have adjustments made in the dose based on her blood sugars. I appreciate Ms. Plyler's assistance in obtaining this system for the patient as well as helping with adjustments based on her sugars. We will reassess her glycemic control on the Vgo30 at the follow-up visit with a repeat hemoglobin A1c. A urine microalbumin was obtained and the result is pending at the time of this dictation. She is otherwise up-to-date on her diabetic health care maintenance. The gabapentin will be continued for her diabetic neuropathy.

## 2015-09-28 NOTE — Assessment & Plan Note (Signed)
Assessment  She has not had any palpitations consistent with atrial fibrillation given her history of paroxysmal atrial fibrillation. Therefore, she is not on rate control. She is appropriately anticoagulated with Coumadin given her INR is 2.1 today.  Plan  We will continue with Coumadin anticoagulation with INR being followed closely in the anticoagulation clinic.

## 2015-09-28 NOTE — Assessment & Plan Note (Signed)
Assessment  Her depression remains reasonably well-controlled on the fluoxetine 20 mg by mouth daily.  Plan  We will continue the fluoxetine at 20 mg by mouth daily and reassess her depressive symptoms at the follow-up visit.

## 2015-09-28 NOTE — Assessment & Plan Note (Signed)
Assessment  Her chronic low back pain is unchanged but the pain is controlled enough to allow her mobility around her house. This is on hydrocodone-acetaminophen 5-325 mg 1-2 tablets every 6 hours as needed for severe pain. She receives #180 per month. She states the TENS unit was not effective in managing her symptoms.  Plan  We will continue with the hydrocodone-acetaminophen at the current dose and reassess her pain and functionality at the follow-up visit.

## 2015-09-28 NOTE — Assessment & Plan Note (Signed)
A mammogram request was placed during this clinic visit. She is also due for an eye exam. Otherwise she is up-to-date on her health care maintenance.

## 2015-09-28 NOTE — Assessment & Plan Note (Signed)
Assessment  She currently is without claudication symptoms suggestive of significant aortic atherosclerotic disease.  Plan  We will continue aggressive modification of her cardiovascular risk factors including her diabetes, hypertension, hyperlipidemia, and tobacco abuse. The specifics for each of these is outlined in this note and are problem based charting.

## 2015-09-28 NOTE — Assessment & Plan Note (Signed)
Assessment  She is tolerating the rosuvastatin 20 mg by mouth daily well without myalgias.  Plan  We will continue with this high intensity statin and reassess for symptoms or intolerances at the follow-up visit.

## 2015-09-28 NOTE — Assessment & Plan Note (Signed)
Assessment  She feels slightly lethargic today which she states is similar to the prior episode of significant symptomatic anemia. A CBC was drawn at this visit and is pending at the time of this dictation.  Plan  We will follow-up on the CBC and if the hemoglobin is less than 7 will contact her for either an outpatient transfusion or an observation admission for an inpatient transfusion. She's had a recent extensive workup and I am not sure that further GI evaluation would be prudent at this time given the low likelihood of meaningful information obtained from repeating this evaluation.

## 2015-09-28 NOTE — Assessment & Plan Note (Addendum)
Assessment  She noted 3 seborrheic keratoses that were bothersome and 2 potential actinic keratoses.  Plan  The 2 potential actinic keratoses on the left leg were treated with cryotherapy. In addition, the 3 seborrheic keratoses were also treated with cryotherapy. There was a seborrheic keratosis in the upper left chest, lower left quadrant of the abdomen, and under the left breast. We will continue cryotherapy on each of these lesions at each visit until there clinically gone.

## 2015-09-28 NOTE — Assessment & Plan Note (Signed)
Assessment  We discussed smoking cessation and its health benefits in her particular situation. She understands these benefits but is currently in the pre-contemplative stage for smoking cessation.  Plan  We will readdress the importance of smoking cessation at the follow-up visit.

## 2015-09-28 NOTE — Assessment & Plan Note (Signed)
>>  ASSESSMENT AND PLAN FOR CHRONIC CHF (CONGESTIVE HEART FAILURE) (HCC) WRITTEN ON 09/28/2015  4:46 PM BY Doneen Poisson, MD  Assessment  Her chronic diastolic heart failure is well compensated on her current regimen of furosemide 80 mg by mouth twice daily.  Plan  We will continue the Lasix at 80 mg by mouth twice daily and reassess her heart failure at the follow-up visit.

## 2015-09-28 NOTE — Assessment & Plan Note (Signed)
Assessment  Her chronic diastolic heart failure is well compensated on her current regimen of furosemide 80 mg by mouth twice daily.  Plan  We will continue the Lasix at 80 mg by mouth twice daily and reassess her heart failure at the follow-up visit.

## 2015-09-28 NOTE — Assessment & Plan Note (Signed)
Assessment  A recent CT scan revealed a small area of soft tissue nodularity along the posterior lateral margin of the region of the prior ablation. The recommendation was to repeat the CT scan in 6 months rather than one year. She was saddened by this news.  Plan  We will repeat the CT scan in January 2018 to assure stability of the small tissue nodularity seen on the most recent CT scan of the abdomen.

## 2015-09-29 LAB — CBC WITH DIFFERENTIAL/PLATELET
Basophils Absolute: 0 10*3/uL (ref 0.0–0.2)
Basos: 0 %
EOS (ABSOLUTE): 0.2 10*3/uL (ref 0.0–0.4)
Eos: 3 %
Hematocrit: 31.2 % — ABNORMAL LOW (ref 34.0–46.6)
Hemoglobin: 9.6 g/dL — ABNORMAL LOW (ref 11.1–15.9)
Immature Grans (Abs): 0 10*3/uL (ref 0.0–0.1)
Immature Granulocytes: 0 %
Lymphocytes Absolute: 1.5 10*3/uL (ref 0.7–3.1)
Lymphs: 25 %
MCH: 26 pg — ABNORMAL LOW (ref 26.6–33.0)
MCHC: 30.8 g/dL — ABNORMAL LOW (ref 31.5–35.7)
MCV: 85 fL (ref 79–97)
Monocytes Absolute: 0.7 10*3/uL (ref 0.1–0.9)
Monocytes: 12 %
Neutrophils Absolute: 3.7 10*3/uL (ref 1.4–7.0)
Neutrophils: 60 %
Platelets: 203 10*3/uL (ref 150–379)
RBC: 3.69 x10E6/uL — ABNORMAL LOW (ref 3.77–5.28)
RDW: 17.8 % — ABNORMAL HIGH (ref 12.3–15.4)
WBC: 6 10*3/uL (ref 3.4–10.8)

## 2015-09-29 LAB — MICROALBUMIN / CREATININE URINE RATIO
Creatinine, Urine: 107.9 mg/dL
MICROALB/CREAT RATIO: 21.6 mg/g creat (ref 0.0–30.0)
Microalbumin, Urine: 23.3 ug/mL

## 2015-10-02 NOTE — Progress Notes (Signed)
Patient ID: [name], female   DOB: 21-Jun-1950, 65 y.o.   MRN: [id number]  CBC Hgb 9.6, Hct 31.2, MCV 85  Stable anemia, will continue current iron supplementation.  Creatinine 107.9 Microalbumin 23.3 Microalbumin/Creatinine 21.6  No evidence of clinically significant microalbuminuria.  We will continue with blood pressure control and working on the transition to the [place] system for the diabetic control.

## 2015-10-08 ENCOUNTER — Other Ambulatory Visit: Payer: Self-pay | Admitting: Dietician

## 2015-10-08 DIAGNOSIS — Z794 Long term (current) use of insulin: Principal | ICD-10-CM

## 2015-10-08 DIAGNOSIS — E1140 Type 2 diabetes mellitus with diabetic neuropathy, unspecified: Secondary | ICD-10-CM

## 2015-10-08 NOTE — Telephone Encounter (Signed)
Tried to call Lucine back- left voicemail requesting blood sugar values on the Vgo30 to support her request for the Vgo40.

## 2015-10-08 NOTE — Telephone Encounter (Signed)
Glennna called back- she says she is back on the VGO 20 because she ran out of VGo30. She says "it maybe helped a little". She is supplementing the Vgo20 with Novolog inhections as follows:  Today:   changed the VGo at 3 am, at 4 am 2 clicks,  379 7am, 15 units Novolog,  230 ~ 10 am, this afternoon-  343 took 20 units Novolog Sunday: 3am- took 5 clicks then changed the VGo at 4 am, 432- took 4 clicks for 7am, 7614 am it was 255, took 10 units Novolog, 709 4 clicks, 8 pm 4 clicks.   Told her that I will work on the prior authorization tomorrow. I can try to get samples of Vgo40 and a sample vial of Novolog  if Dr. Eppie Gibson approves this order.   She requests a prescription for the VGo40 and 3 vials of Novolog per month. Suggest discontinue Levemir.

## 2015-10-08 NOTE — Telephone Encounter (Signed)
Ms. Adduci left message that she needs help with prior authorization for the Vgo40 and also needs two more vials of Novolog insulin. She will call back this afternoon.

## 2015-10-09 ENCOUNTER — Encounter: Payer: PPO | Admitting: Dietician

## 2015-10-09 MED ORDER — V-GO 40 KIT: PACK | 5 refills | Status: DC

## 2015-10-09 MED ORDER — INSULIN ASPART 100 UNIT/ML ~~LOC~~ SOLN: SUBCUTANEOUS | 5 refills | Status: DC

## 2015-10-11 ENCOUNTER — Telehealth: Payer: Self-pay | Admitting: Dietician

## 2015-10-11 NOTE — Telephone Encounter (Signed)
Updated patient on progress of prior authorization and samples.

## 2015-10-11 NOTE — Telephone Encounter (Signed)
I have made many phone calls this week to [place], [facility] DME PA line,  [employer] [facility] customer service,  their pharmacy line and [facility] non medicare and [employer].  I was told by [facility] today that there is no PA filed for [place] for the [place] and was assisted with entering a PA online for expedited review. Await response, which I was told would be in in 24 hours.

## 2015-10-12 NOTE — Telephone Encounter (Signed)
We received notice that the VGO 40 was approved from 10/11/15 through 02/10/2016 by [employer]. We also got sample V-GO40 today. Will give a sample kit to [name] so she can transition as soon as possible.  [facility] and [facility] will deliver V-GO40 next Wednesday with 3 vials of Novolog

## 2015-10-16 ENCOUNTER — Other Ambulatory Visit: Payer: Self-pay | Admitting: Internal Medicine

## 2015-10-16 DIAGNOSIS — I480 Paroxysmal atrial fibrillation: Secondary | ICD-10-CM

## 2015-10-17 ENCOUNTER — Telehealth: Payer: Self-pay | Admitting: Dietician

## 2015-10-17 NOTE — Telephone Encounter (Signed)
Called Ms. Risdon to follow up on blood sugars on the VGo40. The following is information from Ms. Raczka who has been on the VGo 40 for 5 days now. She has been operating it correctly, taking most of the clicks each day emptying the VGo and adding some Novolog to try to lower her blood sugars: 9/6- 120 am-303, 225 am-put vgo on, 242 am 683 took 4 clicks,  9/5- 4196 am- put on, 1225 took 15 units novolog, , 6 am- 222- took 6 clicks, 979 pm- took 3 click, 892- 4 clicks, 10 pm 119- took 15 units novolog  9/4- 1200 am- put on- 417 am 5 clicks, 408 am- 4 clicks, 10 am 144, took 3 clicks, 8185 lunch took 4 clicks, 631 took 2 clicks, 4970 pm 263- 7858 4 clicks.   There is not a Vgo that holds more insulin. As her blood sugars are still uncontrolled and she has not had any low blood sugars or symptoms of low blood sugar, suggested she try using Levemir twice daily instead of the Novolog as it would be safer, less inejctions and longer acting.

## 2015-10-18 NOTE — Telephone Encounter (Signed)
I am OK with her starting lower doses of Levemir and splitting it twice daily if needed while continuing the Vgo40.  Thanks.

## 2015-10-26 ENCOUNTER — Telehealth: Payer: Self-pay | Admitting: Dietician

## 2015-10-26 DIAGNOSIS — M545 Low back pain, unspecified: Secondary | ICD-10-CM

## 2015-10-26 NOTE — Telephone Encounter (Signed)
Called patient to follow up on her use of the VGo: Having sleep problems, tried calling but could not get through.Sleep is taking a priority to her blood sugars, but she says some have been less than 200 and a few more than 300 , 159 last night or this morning.    "Can sleep but cannot sleep" no more than 1 and a half at a time. Sleeping a lot to try to get her rest. Agrees to have Dr.Klima know and would like and agrees to his next available appointment.

## 2015-10-29 NOTE — Telephone Encounter (Signed)
Pt is scheduled for 11-16-15 @ 9:15 am with Dr. Eppie Gibson.  Appt card mailed.

## 2015-10-30 NOTE — Telephone Encounter (Signed)
I returned the call to Ms. Fedak.  With regards to the sleeping issue she states she sleeps for a couple of hours and then awakes.  She remains awake for a couple of hours and then goes back to sleep.  Most of this is therefore sleep hygiene which has been a long standing issue with Ms. Kaelin.  Some of her medication can also cause sleepiness and I am not sure she would be willing to part with them given they are mainly for her chronic pain and anxiety.  We will discuss this at the follow-up appointment.  The other issue that she brought up was the recent exacerbation of her back pain.  She states that it does not hurt as much when she lies down, but is very painful when she stands for a few minutes.  There is no exacerbation of the pain with walking.  This is either her usual musculoskeletal pain that is simply re-exacerbated, spinal stenosis given the characteristics of the pain, or vertebral collapse from her osteoporosis.  This is very debilitating to her and is preventing her from doing some of the activities that she wants to do.  We decided to obtain an MRI looking for evidence of spinal stenosis or vertebral collapse.  We will review the results at the follow-up visit.

## 2015-10-30 NOTE — Addendum Note (Signed)
Addended by: [name] D on: 10/30/2015 09:43 AM   Modules accepted: Orders

## 2015-11-05 ENCOUNTER — Telehealth: Payer: Self-pay | Admitting: Internal Medicine

## 2015-11-05 NOTE — Telephone Encounter (Addendum)
Call pt - concern hgb may be low again. States having same symptoms - not eating,forcing self to take her meds, short of breath - uses 2.5 L O2 with CPAP and 1-2L when go outside (don't wear it all the time);sweating "a little bit" does not a have thermometer. Recently used her inhaler - not SOB while talking. States she's not depressed. Uses transportation - may be able to come this afternoon if sister can bring her otherwise not until 82.

## 2015-11-05 NOTE — Telephone Encounter (Signed)
Would like to talk to nurse

## 2015-11-05 NOTE — Telephone Encounter (Signed)
With those symptoms and history of symptomatic anemia she needs an appointment in the clinic to assess.  I agree with scheduling an appointment as soon as she can make it.  Otherwise, if the symptoms persist and she is unable to make it to clinic she should call 911 for ED assessment.  Thanks.

## 2015-11-05 NOTE — Telephone Encounter (Signed)
Call pt - d/t transportation, will come Wed [facility] @ 1015AM also inform if symptoms persist or worsen to call 911 to go to the ED per Dr Eppie Gibson; voice understanding.

## 2015-11-07 ENCOUNTER — Ambulatory Visit: Payer: PPO

## 2015-11-09 ENCOUNTER — Encounter: Payer: Self-pay | Admitting: Interventional Radiology

## 2015-11-13 ENCOUNTER — Other Ambulatory Visit: Payer: Self-pay | Admitting: Internal Medicine

## 2015-11-13 DIAGNOSIS — J439 Emphysema, unspecified: Secondary | ICD-10-CM

## 2015-11-15 ENCOUNTER — Ambulatory Visit (HOSPITAL_COMMUNITY): Admission: RE | Admit: 2015-11-15 | Payer: PPO | Source: Ambulatory Visit

## 2015-11-15 ENCOUNTER — Ambulatory Visit (HOSPITAL_COMMUNITY)
Admission: RE | Admit: 2015-11-15 | Discharge: 2015-11-15 | Disposition: A | Discharge status: Home / Self Care | Payer: PPO | Source: Ambulatory Visit | Attending: Internal Medicine | Admitting: Internal Medicine

## 2015-11-15 ENCOUNTER — Telehealth: Payer: Self-pay | Admitting: Internal Medicine

## 2015-11-15 DIAGNOSIS — M545 Low back pain, unspecified: Secondary | ICD-10-CM

## 2015-11-15 DIAGNOSIS — M47816 Spondylosis without myelopathy or radiculopathy, lumbar region: Secondary | ICD-10-CM | POA: Diagnosis not present

## 2015-11-15 NOTE — Telephone Encounter (Signed)
APT. REMINDER CALL, LMTCB

## 2015-11-16 ENCOUNTER — Encounter: Payer: Self-pay | Admitting: Internal Medicine

## 2015-11-16 ENCOUNTER — Ambulatory Visit (INDEPENDENT_AMBULATORY_CARE_PROVIDER_SITE_OTHER): Payer: PPO | Admitting: Internal Medicine

## 2015-11-16 VITALS — BP 138/60 | HR 69 | Wt 197.6 lb

## 2015-11-16 DIAGNOSIS — Z794 Long term (current) use of insulin: Secondary | ICD-10-CM

## 2015-11-16 DIAGNOSIS — Z0000 Encounter for general adult medical examination without abnormal findings: Secondary | ICD-10-CM

## 2015-11-16 DIAGNOSIS — Z23 Encounter for immunization: Secondary | ICD-10-CM | POA: Diagnosis not present

## 2015-11-16 DIAGNOSIS — Z8742 Personal history of other diseases of the female genital tract: Secondary | ICD-10-CM

## 2015-11-16 DIAGNOSIS — F329 Major depressive disorder, single episode, unspecified: Secondary | ICD-10-CM

## 2015-11-16 DIAGNOSIS — J439 Emphysema, unspecified: Secondary | ICD-10-CM

## 2015-11-16 DIAGNOSIS — F17210 Nicotine dependence, cigarettes, uncomplicated: Secondary | ICD-10-CM | POA: Diagnosis not present

## 2015-11-16 DIAGNOSIS — F418 Other specified anxiety disorders: Secondary | ICD-10-CM

## 2015-11-16 DIAGNOSIS — L570 Actinic keratosis: Secondary | ICD-10-CM

## 2015-11-16 DIAGNOSIS — E1165 Type 2 diabetes mellitus with hyperglycemia: Secondary | ICD-10-CM | POA: Diagnosis not present

## 2015-11-16 DIAGNOSIS — M545 Low back pain, unspecified: Secondary | ICD-10-CM

## 2015-11-16 DIAGNOSIS — I480 Paroxysmal atrial fibrillation: Secondary | ICD-10-CM

## 2015-11-16 DIAGNOSIS — Z7951 Long term (current) use of inhaled steroids: Secondary | ICD-10-CM

## 2015-11-16 DIAGNOSIS — I872 Venous insufficiency (chronic) (peripheral): Secondary | ICD-10-CM

## 2015-11-16 DIAGNOSIS — Z9981 Dependence on supplemental oxygen: Secondary | ICD-10-CM

## 2015-11-16 DIAGNOSIS — F419 Anxiety disorder, unspecified: Secondary | ICD-10-CM

## 2015-11-16 DIAGNOSIS — Z7901 Long term (current) use of anticoagulants: Secondary | ICD-10-CM

## 2015-11-16 DIAGNOSIS — G8929 Other chronic pain: Secondary | ICD-10-CM | POA: Diagnosis not present

## 2015-11-16 DIAGNOSIS — E1142 Type 2 diabetes mellitus with diabetic polyneuropathy: Secondary | ICD-10-CM

## 2015-11-16 DIAGNOSIS — M797 Fibromyalgia: Secondary | ICD-10-CM

## 2015-11-16 DIAGNOSIS — I6523 Occlusion and stenosis of bilateral carotid arteries: Secondary | ICD-10-CM

## 2015-11-16 DIAGNOSIS — E1140 Type 2 diabetes mellitus with diabetic neuropathy, unspecified: Secondary | ICD-10-CM

## 2015-11-16 DIAGNOSIS — J449 Chronic obstructive pulmonary disease, unspecified: Secondary | ICD-10-CM

## 2015-11-16 DIAGNOSIS — I5032 Chronic diastolic (congestive) heart failure: Secondary | ICD-10-CM

## 2015-11-16 DIAGNOSIS — B3731 Acute candidiasis of vulva and vagina: Secondary | ICD-10-CM

## 2015-11-16 DIAGNOSIS — Z720 Tobacco use: Secondary | ICD-10-CM

## 2015-11-16 DIAGNOSIS — I700 Atherosclerosis of aorta: Secondary | ICD-10-CM

## 2015-11-16 DIAGNOSIS — L821 Other seborrheic keratosis: Secondary | ICD-10-CM

## 2015-11-16 DIAGNOSIS — B373 Candidiasis of vulva and vagina: Secondary | ICD-10-CM

## 2015-11-16 LAB — POCT INR: INR: 2.1

## 2015-11-16 MED ORDER — FERROUS GLUCONATE 324 (38 FE) MG PO TABS: 324.0000 mg | ORAL_TABLET | Freq: Two times a day (BID) | ORAL | 3 refills | Status: DC

## 2015-11-16 MED ORDER — HYDROCODONE-ACETAMINOPHEN 5-325 MG PO TABS: 1.0000 | ORAL_TABLET | Freq: Four times a day (QID) | ORAL | 0 refills | Status: DC | PRN

## 2015-11-16 MED ORDER — FLUCONAZOLE 150 MG PO TABS: 150.0000 mg | ORAL_TABLET | Freq: Once | ORAL | 6 refills | Status: DC

## 2015-11-16 MED ORDER — CYCLOBENZAPRINE HCL 5 MG PO TABS: 5.0000 mg | ORAL_TABLET | Freq: Three times a day (TID) | ORAL | 3 refills | Status: DC | PRN

## 2015-11-16 MED ORDER — GABAPENTIN 300 MG PO CAPS: 600.0000 mg | ORAL_CAPSULE | Freq: Three times a day (TID) | ORAL | 3 refills | Status: DC

## 2015-11-16 NOTE — Assessment & Plan Note (Signed)
Assessment  She continues to smoke and is in the pre-contemplative stage.  Plan  We spent 5 minutes today rehashing the personal impact continued smoking can have on several of her chronic medical conditions including her chronic obstructive pulmonary disease, coronary artery disease, and clear-cell carcinoma of the kidney. Despite this, she is not mentally ready to quit at this time. I encouraged her to continue to think about it and asked her to contact me if she becomes ready as I may be able to help her pharmacologically. We will reassess her readiness for tobacco cessation at the follow-up visit.

## 2015-11-16 NOTE — Assessment & Plan Note (Signed)
Assessment  She's had no lower extremity swelling well on her Lasix therapy for her chronic diastolic heart failure.  Plan  We are continuing the Lasix for her chronic diastolic heart failure and this is positively impacting the control of her lower extremity swelling secondary to her chronic venous insufficiency. We will reassess the efficacy of this therapy at the follow-up visit.

## 2015-11-16 NOTE — Assessment & Plan Note (Signed)
Assessment  I personally reviewed the glucose meter download from this morning. Most of her blood sugars over the last week have been in the high 100s. This is an improvement from the previous several weeks where she was consistently in the 200s. She currently is using the V Go 40 system and goes through all of the NovoLog it stores in a a day. She gives herself additional NovoLog although she is trying to limit this. She is also been giving herself Levemir 20 units twice daily and this may have something to do with the improvement in her blood sugars recently. Her diabetic neuropathy pain is well-controlled on the gabapentin 600 mg by mouth 3 times daily.  Plan  We will continue with the V Go 40 system delivering the NovoLog, as well as continue the Levemir 20 units twice daily and as needed additional NovoLog. She will continue monitoring her blood sugars as she has been. She was not due for hemoglobin A1c and therefore one was not obtained today. She is due for an eye exam and now that her transportation issues are improved she will have this scheduled. For her peripheral neuropathy we will continue the gabapentin at 600 mg by mouth 3 times daily given the effectiveness of this therapy and reassess at follow-up.

## 2015-11-16 NOTE — Assessment & Plan Note (Signed)
Assessment  Her anxiety is persistent but the symptoms are reasonably well-controlled on the as needed lorazepam and fluoxetine. She is able to function, at least from an anxiety perspective, on this regimen.  Plan  We will continue the lorazepam 0.5-1 mg every 12 hours as needed for anxiety and the fluoxetine at 20 mg by mouth daily. We will reassess her anxiety symptoms at the follow-up visit.

## 2015-11-16 NOTE — Assessment & Plan Note (Signed)
Assessment  There was not much change with the initial cryotherapy provided to her actinic keratoses lesions at the last visit. These lesions were on the upper left chest, underneath the left breast, on the left side of her mid abdomen, and 2 lesions on her left lower extremity.  Plan  We provided additional freeze-thaw cryotherapy to these 5 lesions today while in the office. We will reassess any changes in these lesions at the follow-up visit.

## 2015-11-16 NOTE — Assessment & Plan Note (Signed)
She has been due for her mammogram for a while but has not found transportation to get her to the appointment. Now that her transportation situation is improved we will try to reschedule her mammogram. She also received her flu shot today. She is otherwise up-to-date on her general medicine healthcare maintenance.

## 2015-11-16 NOTE — Patient Instructions (Addendum)
It was good to see you again today.  Thank you for taking the time to have a list.  I think we got more done today than usual.  1)  Keep taking the medications as you are.  2) We gave you the flu shot today.  3) I will look into the belly cramps and the Vgo system.  4) Flexeril 5 mg every eight hours as needed.  5) Keep track of these sweats and see if they are related to a particular time or activity.  6)  Keep thinking about quitting smoking, I can try to help when you are ready.  7) I gave you 3 written prescriptions for the hydrocodone.  8) We provided you with cryotherapy for your skin lesions today.  We will keep working this.  I will see you in 3 months, sooner if necessary.

## 2015-11-16 NOTE — Assessment & Plan Note (Signed)
Assessment  Subjectively her depression is stable on the fluoxetine 20 mg by mouth daily. This SSRI has worked much better for her than others we have tried in the past.  Plan  We will continue the fluoxetine at 20 mg by mouth daily and reassess the control of her depression at the follow-up visit.

## 2015-11-16 NOTE — Assessment & Plan Note (Signed)
Assessment  Her aortic atherosclerotic disease remains asymptomatic at this time. She is without evidence of claudication.  Plan  We will continue to monitor for symptoms of her aortic atherosclerotic disease. In the meantime we will continue to aggressively treat her risk factors including her hypertension, hyperlipidemia, and diabetes. We also are continuing to encourage smoking cessation and will do so at each clinic visit.

## 2015-11-16 NOTE — Assessment & Plan Note (Signed)
Assessment  She has not had any symptoms consistent with atrial fibrillation and cardiac exam seem to indicate a regular rhythm. She is tolerating the warfarin therapy well and her INR today was 2.1.  Plan  We will continue with the warfarin anticoagulation with management in the anticoagulation clinic. We will reassess for symptoms consistent with paroxysms of atrial fibrillation or an exam consistent with atrial fibrillation at the follow-up visit.

## 2015-11-16 NOTE — Assessment & Plan Note (Signed)
Assessment  Her fibromyalgia symptoms are well controlled on the gabapentin 600 mg by mouth 3 times daily.  Plan  We will continue with the gabapentin at 600 mg by mouth 3 times daily and reassess the control of her fibromyalgia pain at the follow-up visit.

## 2015-11-16 NOTE — Progress Notes (Signed)
   Subjective:    Patient ID: [name], female    DOB: 1950-07-03, 65 y.o.   MRN: [id number]  HPI  [name] is here for follow-up of her chronic low back pain, diabetes, hypertension, depression, and COPD. Please see the A&P for the status of the pt's chronic medical problems.  Review of Systems  Constitutional: Positive for fatigue. Negative for activity change and unexpected weight change.       Weight change is not expected although she has put on weight since changing insulin systems.  Respiratory: Positive for shortness of breath. Negative for chest tightness.   Cardiovascular: Negative for chest pain, palpitations and leg swelling.  Gastrointestinal: Positive for abdominal pain.       Left mid abdomen crampy pain that stated when she began the VGo system.  Genitourinary: Positive for vaginal discharge.       Occasional vaginal yeast infections that respond to fluconazole.  Musculoskeletal: Positive for arthralgias, back pain and myalgias. Negative for joint swelling.  Psychiatric/Behavioral: Positive for dysphoric mood and sleep disturbance. The patient is nervous/anxious.       Objective:   Physical Exam  Constitutional: She is oriented to person, place, and time. She appears well-developed and well-nourished. No distress.  HENT:  Head: Normocephalic and atraumatic.  Eyes: Conjunctivae are normal. Right eye exhibits no discharge. Left eye exhibits no discharge. No scleral icterus.  Cardiovascular: Normal rate, regular rhythm and normal heart sounds.   Pulmonary/Chest: Effort normal and breath sounds normal. No respiratory distress. She has no wheezes. She has no rales.  Abdominal: Soft. Bowel sounds are normal. She exhibits no distension. There is tenderness. There is no rebound and no guarding.  Mild left mid abdominal pain with a small "knot" palpated in the SQ tissues.  No hernias or other lesions.  Musculoskeletal: Normal range of motion. She exhibits no edema  or tenderness.  Neurological: She is alert and oriented to person, place, and time. She exhibits normal muscle tone.  Skin: Skin is warm and dry. No rash noted. She is not diaphoretic. No erythema.  Psychiatric: She has a normal mood and affect. Her behavior is normal. Judgment and thought content normal.  Nursing note and vitals reviewed.     Assessment & Plan:    Please see problem oriented charting.

## 2015-11-16 NOTE — Assessment & Plan Note (Signed)
Assessment  She continues to get occasional vaginal candidiasis that has responded well to fluconazole 150 mg once. She currently is asymptomatic.  Plan  Fluconazole 150 mg once was refilled with 6 additional refills for recurrent disease. We will continue to reassess the efficacy of this therapy at the follow-up visit.

## 2015-11-16 NOTE — Assessment & Plan Note (Signed)
Assessment  Although she has severe chronic obstructive pulmonary disease requiring home oxygen therapy she is not limited by her lung disease, unfortunately because she is limited by her chronic low back pain instead. Thus pulmonary symptoms are not a prominent part of her complaints. Her current regimen consists of Advair, Spiriva, and as needed albuterol.  Plan  We will continue with the Advair Diskus, Spiriva, and albuterol at the current doses. She will also continue with her home oxygen therapy. We will reassess the control of her symptoms on this regimen at the follow-up visit.

## 2015-11-16 NOTE — Assessment & Plan Note (Signed)
Assessment  Her main complaint today is her chronic low back pain. She states it starts in the lumbar region and radiates to both hips. It does not radiate down either leg. She has difficulty ambulating about the house because of this pain. The hydrocodone does help. Physical exam today was notable for paraspinal muscle tenderness especially on the left. It is quite possible that some of her pain is related to muscle spasms, particularly in the left paraspinal area.  Plan  We will continue with the hydrocodone 5-325 g 1-2 tablets every 6 hours as needed. I reviewed the [place] controlled substance database and found that her last refill of #180 was 2 months ago. I therefore informed her that I was going to decrease the monthly tablets to #150 from #180 per month based on her usage. I started Flexeril 5 mg by mouth every 8 hours as needed for back pain and muscle spasms. We will reassess the efficacy of this therapy and improving her back pain and functionality around her house at the follow-up visit.

## 2015-11-20 ENCOUNTER — Telehealth: Payer: Self-pay | Admitting: Internal Medicine

## 2015-11-20 NOTE — Telephone Encounter (Signed)
I returned the call.  She remains asymptomatic with regards to an infection other than these sweats, the cause of which I remain unsure.  We both decided to continue to follow awaiting the development of some other symptoms that may give [place] a clue to a direction in which to proceed.  She is happy with this plan.  Follow-up has been moved up to 01/11/2016 _0 :15.

## 2015-11-20 NOTE — Telephone Encounter (Signed)
BS this am 200 H/a x 3 days Chills and sweating Unable to check temperature Sporadic episodes Appetite has decreased Taking meds as directed Denies N&V Denies increased shortness of breath Denies additional congestion/ cough

## 2015-11-20 NOTE — Telephone Encounter (Signed)
Pt called to report her ([name]) since her 11/16/2015 visit with Dr. Eppie Gibson. For 3 days she soak and wet from head to toe and also very cold.  Please advise per the patient.

## 2015-11-21 ENCOUNTER — Other Ambulatory Visit: Payer: Self-pay | Admitting: Internal Medicine

## 2015-11-21 DIAGNOSIS — Z1231 Encounter for screening mammogram for malignant neoplasm of breast: Secondary | ICD-10-CM

## 2015-12-11 ENCOUNTER — Other Ambulatory Visit: Payer: Self-pay | Admitting: Internal Medicine

## 2015-12-11 DIAGNOSIS — J301 Allergic rhinitis due to pollen: Secondary | ICD-10-CM

## 2015-12-11 DIAGNOSIS — B373 Candidiasis of vulva and vagina: Secondary | ICD-10-CM

## 2015-12-11 DIAGNOSIS — I480 Paroxysmal atrial fibrillation: Secondary | ICD-10-CM

## 2015-12-11 DIAGNOSIS — B3731 Acute candidiasis of vulva and vagina: Secondary | ICD-10-CM

## 2015-12-12 ENCOUNTER — Telehealth: Payer: Self-pay

## 2015-12-12 ENCOUNTER — Telehealth: Payer: Self-pay | Admitting: Dietician

## 2015-12-12 NOTE — Telephone Encounter (Signed)
Called to follow up on how patient is doing with the Vgo and her diabetes. She says she is "Miserable, but for other reasons". Having hot flashes, but does not think they are related to her blood sugars. Called [facility] social work on Friday but she has not heard back from them. She says she is not managing very well on her own. Might need more help. Agrees to social work for possible help for additional services. Says she refused [facility] in the past, but has possibly changed her mind.   Blood sugar not high and not low, not any better not any worse. Takes 20 units of levemir about every other day and some Novolog as well depending on what she eats.  She reports her blood sugars are mostly between 150 and 250 and this is fine with her.

## 2015-12-12 NOTE — Telephone Encounter (Signed)
Thank you for the update.  I agree with [facility] social work to assist with arranging additional resources for [name].  She is scheduled to be seen in clinic by me in 2 days and I will reassess at that time.

## 2015-12-12 NOTE — Telephone Encounter (Signed)
I have spoken to pt twice today and scheduled an appt, she seems depressed as she speaks

## 2015-12-12 NOTE — Telephone Encounter (Signed)
Needs to speak with a nurse.

## 2015-12-13 ENCOUNTER — Telehealth: Payer: Self-pay | Admitting: Internal Medicine

## 2015-12-13 ENCOUNTER — Other Ambulatory Visit: Payer: Self-pay | Admitting: Licensed Clinical Social Worker

## 2015-12-13 DIAGNOSIS — I5032 Chronic diastolic (congestive) heart failure: Secondary | ICD-10-CM

## 2015-12-13 NOTE — Telephone Encounter (Signed)
Referral to [facility] SW placed.

## 2015-12-13 NOTE — Telephone Encounter (Signed)
AP. REMINDER  CALL, LMTCB

## 2015-12-14 ENCOUNTER — Encounter: Payer: Self-pay | Admitting: Internal Medicine

## 2015-12-14 ENCOUNTER — Ambulatory Visit (INDEPENDENT_AMBULATORY_CARE_PROVIDER_SITE_OTHER): Payer: PPO | Admitting: Internal Medicine

## 2015-12-14 VITALS — BP 143/95 | HR 65 | Temp 97.4°F | Wt 196.4 lb

## 2015-12-14 DIAGNOSIS — Z794 Long term (current) use of insulin: Secondary | ICD-10-CM

## 2015-12-14 DIAGNOSIS — B373 Candidiasis of vulva and vagina: Secondary | ICD-10-CM

## 2015-12-14 DIAGNOSIS — E1140 Type 2 diabetes mellitus with diabetic neuropathy, unspecified: Secondary | ICD-10-CM | POA: Diagnosis not present

## 2015-12-14 DIAGNOSIS — M810 Age-related osteoporosis without current pathological fracture: Secondary | ICD-10-CM

## 2015-12-14 DIAGNOSIS — F17210 Nicotine dependence, cigarettes, uncomplicated: Secondary | ICD-10-CM

## 2015-12-14 DIAGNOSIS — I872 Venous insufficiency (chronic) (peripheral): Secondary | ICD-10-CM | POA: Diagnosis not present

## 2015-12-14 DIAGNOSIS — E785 Hyperlipidemia, unspecified: Secondary | ICD-10-CM | POA: Diagnosis not present

## 2015-12-14 DIAGNOSIS — J439 Emphysema, unspecified: Secondary | ICD-10-CM | POA: Diagnosis not present

## 2015-12-14 DIAGNOSIS — Z0000 Encounter for general adult medical examination without abnormal findings: Secondary | ICD-10-CM

## 2015-12-14 DIAGNOSIS — Z79891 Long term (current) use of opiate analgesic: Secondary | ICD-10-CM

## 2015-12-14 DIAGNOSIS — M545 Low back pain, unspecified: Secondary | ICD-10-CM

## 2015-12-14 DIAGNOSIS — Z79899 Other long term (current) drug therapy: Secondary | ICD-10-CM

## 2015-12-14 DIAGNOSIS — G8929 Other chronic pain: Secondary | ICD-10-CM

## 2015-12-14 DIAGNOSIS — R61 Generalized hyperhidrosis: Secondary | ICD-10-CM | POA: Insufficient documentation

## 2015-12-14 DIAGNOSIS — F329 Major depressive disorder, single episode, unspecified: Secondary | ICD-10-CM

## 2015-12-14 DIAGNOSIS — Z7951 Long term (current) use of inhaled steroids: Secondary | ICD-10-CM

## 2015-12-14 DIAGNOSIS — Z720 Tobacco use: Secondary | ICD-10-CM

## 2015-12-14 DIAGNOSIS — B3731 Acute candidiasis of vulva and vagina: Secondary | ICD-10-CM

## 2015-12-14 DIAGNOSIS — E7849 Other hyperlipidemia: Secondary | ICD-10-CM

## 2015-12-14 LAB — GLUCOSE, CAPILLARY: Glucose-Capillary: 172 mg/dL — ABNORMAL HIGH (ref 65–99)

## 2015-12-14 LAB — POCT GLYCOSYLATED HEMOGLOBIN (HGB A1C): Hemoglobin A1C: 7.6

## 2015-12-14 MED ORDER — FLUOXETINE HCL 20 MG PO TABS: 40.0000 mg | ORAL_TABLET | Freq: Every day | ORAL | 3 refills | Status: DC

## 2015-12-14 MED ORDER — FLUTICASONE-SALMETEROL 500-50 MCG/DOSE IN AEPB: 1.0000 | INHALATION_SPRAY | Freq: Two times a day (BID) | RESPIRATORY_TRACT | 3 refills | Status: DC

## 2015-12-14 NOTE — Progress Notes (Signed)
   Subjective:    Patient ID: [name], female    DOB: 1950-02-11, 65 y.o.   MRN: [id number]  HPI  [name] is here for follow-up of her chronic obstructive pulmonary disease, major depression, tobacco use disorder, vaginal candidiasis, and chronic pain. Please see the A&P for the status of the pt's chronic medical problems.  Review of Systems  Constitutional: Negative for activity change, appetite change and unexpected weight change.  Respiratory: Positive for shortness of breath. Negative for chest tightness and wheezing.        Has to use rescue inhaler 3 times a day on average which is a slight increase  Cardiovascular: Positive for leg swelling. Negative for chest pain and palpitations.       Bilateral lower extremity edema-mild.  She has not taken her PRN lasix for 2 days.  Gastrointestinal: Negative for abdominal distention, abdominal pain, diarrhea and vomiting.  Genitourinary: Positive for genital sores.       Vulvar puritis  Musculoskeletal: Positive for arthralgias and back pain. Negative for joint swelling.  Psychiatric/Behavioral: Positive for dysphoric mood and sleep disturbance. Negative for suicidal ideas. The patient is nervous/anxious.       Objective:   Physical Exam  Constitutional: She is oriented to person, place, and time. She appears well-developed and well-nourished. No distress.  HENT:  Head: Normocephalic and atraumatic.  Eyes: Conjunctivae are normal. Right eye exhibits no discharge. Left eye exhibits no discharge. No scleral icterus.  Cardiovascular: Normal rate, regular rhythm and normal heart sounds.  Exam reveals no gallop and no friction rub.   No murmur heard. Pulmonary/Chest: Effort normal and breath sounds normal. No respiratory distress. She has no wheezes. She has no rales.  Distant breath sounds  Abdominal: Soft. Bowel sounds are normal. She exhibits no distension. There is no tenderness. There is no rebound and no guarding.    Genitourinary:    No labial fusion. There is no rash, tenderness, lesion or injury on the right labia. There is no rash, tenderness, lesion or injury on the left labia. There is erythema in the vagina. No bleeding in the vagina. No foreign body in the vagina. No signs of injury around the vagina. No vaginal discharge found.  Musculoskeletal: Normal range of motion. She exhibits edema. She exhibits no tenderness.  1+ bilateral pitting edema to ankles  Lymphadenopathy:       Right: No inguinal adenopathy present.       Left: No inguinal adenopathy present.  Neurological: She is alert and oriented to person, place, and time. She exhibits abnormal muscle tone.  Skin: Skin is warm and dry. Rash noted. She is not diaphoretic. No erythema.  Scaly, red, patches under abdominal pannus  Psychiatric: She has a normal mood and affect. Her behavior is normal. Judgment and thought content normal.  Nursing note and vitals reviewed.     Assessment & Plan:   Please see problem based charting.

## 2015-12-14 NOTE — Assessment & Plan Note (Signed)
Assessment  Her current regimen includes Spiriva, Advair Diskus 250-50, and albuterol when necessary. She's been relatively stable on this regimen although admits that she's had to slightly increase the use of her rescue albuterol inhaler. She therefore inquired as to whether or not she could go up on her Advair Diskus dose. Given the increased need for rescue inhaler usage it is not unreasonable to adjust the Advair Diskus dose upwards.  Plan  We will continue the Spiriva and as needed albuterol and increase the Advair Diskus to 500 g-50 one actuation twice daily. We will reassess her symptomatic response to this increased dose at the follow-up visit.

## 2015-12-14 NOTE — Assessment & Plan Note (Signed)
Assessment  I spent more than 3 minutes discussing the adverse effects of tobacco use. She feels that if she were to quit now she would "lose her marbles". She does remains in the pre-contemplative stage of tobacco cessation. She may benefit from more aggressive management of her depression as outlined above.  Plan  We will reassess her readiness to quit smoking and continue with the counseling advocating smoking cessation at the follow-up visit.

## 2015-12-14 NOTE — Assessment & Plan Note (Signed)
Assessment  She is tolerating the Prozac well and its efficacy is much better than the other SSRIs we have tried in the past. That being said, she still feels depressed but specifically denies suicidal ideation. Given the relatively low dose of the Prozac she is currently on it is not unreasonable to increase the dose and assess her response.  Plan  We will increase the Prozac to 40 mg by mouth daily and reassess her level of depression at the follow-up visit.

## 2015-12-14 NOTE — Assessment & Plan Note (Signed)
Assessment  She is tolerating the Crestor 20 mg by mouth nightly without myalgias.  Plan  We will continue the high intensity statin and reassess for evidence of intolerance at the follow-up visit.

## 2015-12-14 NOTE — Assessment & Plan Note (Addendum)
Assessment  She is tolerating the VGO 40 system well but on average has to give herself an additional 20 units of Levemir daily. She also adjusts her NovoLog as required. Her hemoglobin A1c today was 7.6 improved from 8.0 three months ago. Her neuropathy is well controlled on the gabapentin 600 mg by mouth 3 times daily.  Plan  She was praised on her improved glycemic control and her tolerance of the VGO 40 system. We will continue with the current regimen and reassess her diabetic control at the follow-up visit. She has scheduled an eye examination for next month. We will continue the gabapentin at 600 milligrams by mouth 3 times daily for her diabetic neuropathy.

## 2015-12-14 NOTE — Assessment & Plan Note (Signed)
Assessment  She states she is unable to tolerate the alendronate weekly. Therefore she has not been taking it.  Plan  Given her intolerance for the alendronate we will encourage her to continue with the vitamin D and calcium. We will also make every effort to avoid medications that may adversely impact her bone health such as prednisone. We will reassess for any evidence of worsening osteoporosis at the follow-up visit. Alternative therapies may be something we need to pursue moving forward and this can be discussed in the future.

## 2015-12-14 NOTE — Assessment & Plan Note (Signed)
Assessment  She continues to have recurrent vulvovaginal candidiasis. This responds very well to as needed fluconazole 150 mg once. A pelvic examination was performed today to make sure she did not have some other etiology for her symptoms and the exam was not consistent with lichen sclerosis or other lesions. It was consistent with recurrent vulvovaginal candidiasis.  Plan  She was advised to take fluconazole 150 mg by mouth once. If her symptoms persisted she was instructed to take a second 150 mg dose of fluconazole in 3 days. We will reassess the efficacy of the as needed fluconazole therapy at the follow-up visit.

## 2015-12-14 NOTE — Assessment & Plan Note (Signed)
Assessment  Her chronic insufficiency is slightly worse today than on the previous visit. This is likely related to the fact that she has not taken her as needed Lasix in 2 days.  Plan  She was advised to take the Lasix to see if her bilateral chronic venous insufficiency improves. This will be reassessed at the follow-up visit.

## 2015-12-14 NOTE — Assessment & Plan Note (Signed)
Assessment  She continues to have unexplained night sweats. She denies weight loss or decrease in her appetite. She also denies any adenopathy. She has no previous known history of TB exposure. She also denies any other signs or symptoms that would suggest an infection. Thus, her night sweats remain unexplained and we have little direction in which to pursue a possible explanation.  Plan  We will check a QuantiFERON gold to see if she has been exposed to TB in the past. We are continuing to evaluate her expectantly awaiting other signs or symptoms that may give [place] a direction in which to pursue a potential diagnosis. It is reassuring that she has not had other constitutional symptoms including decreased appetite and weight loss.

## 2015-12-14 NOTE — Assessment & Plan Note (Signed)
Assessment  Her chronic low back pain remains unchanged but is improved with her current regimen which includes the hydrocodone and Flexeril 5 mg by mouth every 8 hours as needed.  Plan  We will continue with the hydrocodone and the Flexeril as needed. We will reassess the efficacy of this therapy at the follow-up visit.

## 2015-12-14 NOTE — Patient Instructions (Addendum)
It was nice to see you again.  You are doing better with your diabetes.  1) I increased your Advair to 500-50 1 puff twice a day for your breathing.  2) I increased your fluoxitine (prozac) to 40 mg (20 tablets) daily for depression.  3) Take the fluconazole today.  If you still have the symptoms take another dose in 3 days.  4) Keep taking your other medications as you are.  5) We checked a blood test to see if you may have been exposed to Tb.  I will call you with the results when I get them (should be next week).  I will see you back in December as already scheduled, sooner if necessary.

## 2015-12-14 NOTE — Assessment & Plan Note (Signed)
She is scheduled for a mammogram next month. Otherwise she is up-to-date on her general health care maintenance.

## 2015-12-17 ENCOUNTER — Other Ambulatory Visit: Payer: Self-pay

## 2015-12-18 LAB — QUANTIFERON TB GOLD ASSAY (BLOOD)

## 2015-12-18 LAB — QUANTIFERON IN TUBE
QFT TB AG MINUS NIL VALUE: 0.01 IU/mL
QUANTIFERON MITOGEN VALUE: 6.73 IU/mL
QUANTIFERON TB AG VALUE: 0.03 IU/mL
QUANTIFERON TB GOLD: NEGATIVE
Quantiferon Nil Value: 0.02 IU/mL

## 2015-12-18 NOTE — Progress Notes (Signed)
Patient ID: [name], female   DOB: 01-11-1951, 65 y.o.   MRN: [id number]  Quantiferon test: Negative.  I doubt night sweats are secondary to Tb in light of this test result and no other supporting symptoms.

## 2015-12-18 NOTE — Patient Outreach (Signed)
Unsuccessful attempt made to contact patient for community care coordination. Will make another attempt to contact patient on December 19, 2015.

## 2016-01-02 ENCOUNTER — Other Ambulatory Visit: Payer: Self-pay

## 2016-01-02 NOTE — Patient Outreach (Signed)
    aFter several unsuccessful attempt to contact patient, this RNCM was able to make contact. Home visit scheduled for within the next 14 days for community care coordination.

## 2016-01-08 ENCOUNTER — Other Ambulatory Visit: Payer: Self-pay | Admitting: Internal Medicine

## 2016-01-08 DIAGNOSIS — F419 Anxiety disorder, unspecified: Secondary | ICD-10-CM

## 2016-01-10 ENCOUNTER — Telehealth: Payer: Self-pay | Admitting: Internal Medicine

## 2016-01-10 NOTE — Telephone Encounter (Signed)
APT. REMINDER CALL, LMTCB

## 2016-01-11 ENCOUNTER — Ambulatory Visit (INDEPENDENT_AMBULATORY_CARE_PROVIDER_SITE_OTHER): Payer: PPO | Admitting: Internal Medicine

## 2016-01-11 ENCOUNTER — Encounter: Payer: Self-pay | Admitting: Internal Medicine

## 2016-01-11 VITALS — BP 136/46 | HR 79 | Temp 98.5°F | Wt 196.7 lb

## 2016-01-11 DIAGNOSIS — J439 Emphysema, unspecified: Secondary | ICD-10-CM

## 2016-01-11 DIAGNOSIS — F17210 Nicotine dependence, cigarettes, uncomplicated: Secondary | ICD-10-CM

## 2016-01-11 DIAGNOSIS — B373 Candidiasis of vulva and vagina: Secondary | ICD-10-CM

## 2016-01-11 DIAGNOSIS — L821 Other seborrheic keratosis: Secondary | ICD-10-CM

## 2016-01-11 DIAGNOSIS — Z0000 Encounter for general adult medical examination without abnormal findings: Secondary | ICD-10-CM

## 2016-01-11 DIAGNOSIS — F419 Anxiety disorder, unspecified: Secondary | ICD-10-CM

## 2016-01-11 DIAGNOSIS — Z7901 Long term (current) use of anticoagulants: Secondary | ICD-10-CM | POA: Diagnosis not present

## 2016-01-11 DIAGNOSIS — G8929 Other chronic pain: Secondary | ICD-10-CM

## 2016-01-11 DIAGNOSIS — M545 Low back pain, unspecified: Secondary | ICD-10-CM

## 2016-01-11 DIAGNOSIS — R61 Generalized hyperhidrosis: Secondary | ICD-10-CM

## 2016-01-11 DIAGNOSIS — I480 Paroxysmal atrial fibrillation: Secondary | ICD-10-CM

## 2016-01-11 DIAGNOSIS — I700 Atherosclerosis of aorta: Secondary | ICD-10-CM | POA: Diagnosis not present

## 2016-01-11 DIAGNOSIS — J438 Other emphysema: Secondary | ICD-10-CM

## 2016-01-11 DIAGNOSIS — Z720 Tobacco use: Secondary | ICD-10-CM

## 2016-01-11 DIAGNOSIS — B3731 Acute candidiasis of vulva and vagina: Secondary | ICD-10-CM

## 2016-01-11 DIAGNOSIS — F329 Major depressive disorder, single episode, unspecified: Secondary | ICD-10-CM

## 2016-01-11 LAB — POCT INR: INR: 1.4

## 2016-01-11 MED ORDER — FLUCONAZOLE 150 MG PO TABS: 150.0000 mg | ORAL_TABLET | Freq: Once | ORAL | 3 refills | Status: DC

## 2016-01-11 MED ORDER — ALPRAZOLAM 1 MG PO TABS: 0.5000 mg | ORAL_TABLET | Freq: Two times a day (BID) | ORAL | 5 refills | Status: DC | PRN

## 2016-01-11 MED ORDER — CYCLOBENZAPRINE HCL 5 MG PO TABS: 5.0000 mg | ORAL_TABLET | Freq: Three times a day (TID) | ORAL | 3 refills | Status: DC | PRN

## 2016-01-11 MED ORDER — NYSTATIN 100000 UNIT/GM EX CREA: TOPICAL_CREAM | CUTANEOUS | 3 refills | Status: DC

## 2016-01-11 NOTE — Assessment & Plan Note (Signed)
Assessment  She continues to have night sweats that remain without explanation. Evaluation thus far has been unrevealing. She continues to have left lower quadrant abdominal pain and some suggestion of defects in the fascial wall at the site of her pain without any palpable herniation. I personally reviewed her CT scan of the abdomen from July 2017 and did not see any herniation at that time.  Plan  We will continue to assess her for symptoms that may give [place] direction to continue evaluating these night sweats. At this point there is nothing to point [place] in any particular direction. She is scheduled for a CT scan of the abdomen in January to follow-up on the bed of the left kidney that underwent cryotherapy for renal cell carcinoma. We will review that CT scan that is already planned for evidence of fascial wall defect of the abdomen with any herniation.

## 2016-01-11 NOTE — Patient Instructions (Addendum)
It was good to see you again.  1) We froze three lesions today.  2) I fixed some of the pharmacy problems.  3) Increase coumadin to 1 tablet (5 mg) on Mondays and continue the 1/2 tablet (2.5 mg) every other day of the week.  4) Keep taking all of your other medications as you are.  5) We will follow-up the mammogram and CT scans.  I will see you in 3 months, sooner if necessary.

## 2016-01-11 NOTE — Assessment & Plan Note (Signed)
Assessment  We again spent over 4 minutes of the visit discussing the importance of tobacco cessation and I offered her pharmacologic support were she ready to quit. Unfortunately, she remains in the pre-contemplative stage and is not interested in quitting at this time.  Plan  I reminded her that she could call me at any time if she changes her mind as I several options for her pharmacologically. We will discuss tobacco cessation at the follow-up visit.

## 2016-01-11 NOTE — Assessment & Plan Note (Signed)
Assessment  She has not increased the Prozac dose as recommended at the last clinic visit, thus we are unable to assess the efficacy of the increased dose at this time. She states she plans on starting the increased dose today. She spent considerable time discussing some issues she was having with her siblings and how lonely she felt since she has been unable to see her only surviving friend. I suspect this loneliness is impacting upon her depression, yet she does not have the initiative to get out and make additional friends, even when given the opportunity.  Plan  I spent considerable time during this visit listening to her concerns and trying to stimulate self reflection on her familial relationships. I encouraged her to increase her Prozac to 40 mg by mouth daily and we will reassess her depression symptoms on the higher Prozac dose at the follow-up visit.

## 2016-01-11 NOTE — Assessment & Plan Note (Signed)
Assessment  She continues to deny symptoms of claudication to suggest symptomatic aortic atherosclerotic disease.  Plan  We will continue to aggressively work on risk factor modification including her hypertension and diabetic control and continue to stress the importance of tobacco cessation. We will reassess for symptoms of symptomatic aortic atherosclerotic disease at the follow-up visit.

## 2016-01-11 NOTE — Assessment & Plan Note (Signed)
Assessment  I believe her anxiety is also associated with her loneliness and lack of support with helping around the house or going out for groceries. She states the as needed alprazolam has been effective in controlling some of her anxiety.  Plan  I encouraged her to increase the Prozac dose to 40 mg daily as recommended at the last visit. We renewed the alprazolam as this was effective and is a long-standing medication for her. We will reassess her anxiety on the higher dose of Prozac at the follow-up visit.

## 2016-01-11 NOTE — Assessment & Plan Note (Signed)
Assessment  She has had no symptoms associated with her paroxysmal atrial fibrillation. She did miss one dose of Coumadin 3 days ago. Her point-of-care INR this morning was 1.4. I doubt missing 1 dose would explain the 1.4.  Plan  I asked her to increase her Coumadin to 5 mg on Mondays and 2.5 mg on all other days. We will reassess her point-of-care INR in the anticoagulation clinic next month.

## 2016-01-11 NOTE — Assessment & Plan Note (Signed)
Assessment  She states that her breathing is subjectively better with the increase in the Advair dose to 500-50. She is also continued using the Spiriva and the as needed albuterol.  Plan  With the improvement in her symptoms we will continue the Advair Diskus at 500-50 1 actuation twice daily, Spiriva 1 actuation daily, and albuterol 2 puffs every 6 hours as needed for dyspnea. We will reassess the efficacy of this regimen at controlling her dyspnea at the follow-up visit.

## 2016-01-11 NOTE — Assessment & Plan Note (Signed)
Assessment  Her seborrheic keratoses are not appreciably improved since the start of cryotherapy.  Plan  3 lesions were provided cryotherapy today and we will continue to do so until they saw fall off.

## 2016-01-11 NOTE — Progress Notes (Signed)
   Subjective:    Patient ID: [name], female    DOB: [birth year], 65 y.o.   MRN: [id number]  HPI  [name] is here for follow-up of her major depression, anxiety, COPD, chronic back pain, and atrial fibrillation. Please see the A&P for the status of the pt's chronic medical problems.  Today we spent considerable time during the visit talking about various familial relationships [name] was struggling with. No specific intervention was utilized other than listening and stimulating self reflection.  Review of Systems  Constitutional: Positive for diaphoresis. Negative for activity change, appetite change and unexpected weight change.  Respiratory: Positive for shortness of breath. Negative for wheezing.   Cardiovascular: Negative for chest pain, palpitations and leg swelling.  Gastrointestinal: Positive for abdominal pain.  Musculoskeletal: Positive for arthralgias, back pain and myalgias. Negative for joint swelling.  Skin: Negative for color change, rash and wound.  Neurological: Positive for headaches.  Psychiatric/Behavioral: Positive for sleep disturbance. Negative for suicidal ideas. The patient is nervous/anxious.       Objective:   Physical Exam  Constitutional: She is oriented to person, place, and time. She appears well-developed and well-nourished. No distress.  HENT:  Head: Normocephalic and atraumatic.  Eyes: Conjunctivae are normal. Right eye exhibits no discharge. Left eye exhibits no discharge. No scleral icterus.  Abdominal: Soft. She exhibits no distension and no mass. There is tenderness. There is no rebound and no guarding.  Tenderness in the LLQ without peritoneal signed.  I felt defects in either the fascia or abdominal muscle on the left but no herniation.  Musculoskeletal: Normal range of motion. She exhibits no edema or tenderness.  Neurological: She is alert and oriented to person, place, and time. She exhibits normal muscle tone.  Skin: Skin  is warm and dry. Rash noted. She is not diaphoretic. No erythema.  Dry skin on both legs  Psychiatric: She has a normal mood and affect. Her behavior is normal. Judgment and thought content normal.  Nursing note and vitals reviewed.     Assessment & Plan:   Please see problem oriented charting.

## 2016-01-11 NOTE — Assessment & Plan Note (Signed)
She is scheduled for a mammogram on 01/24/2016. We also will place a referral for an eye examination which she is due. Finally, at the follow-up visit we will perform a formal diabetic foot examination. Otherwise she is up-to-date on her health care maintenance.

## 2016-01-11 NOTE — Assessment & Plan Note (Signed)
Assessment  She continues to have symptoms of vulvovaginal candidiasis. Unfortunately I had not provided her with enough fluconazole at the last visit and for some reason she was unable to get the refills of the miconazole that were prescribed for her.  Plan  I redid the fluconazole prescription so that she would have a enough for 2 courses and gave her additional refills. I also redid the miconazole cream prescription to assure that she would be getting it as needed. We will reassess her symptoms of the vulvovaginal candidiasis at the follow-up visit.

## 2016-01-15 ENCOUNTER — Telehealth: Payer: Self-pay | Admitting: *Deleted

## 2016-01-15 ENCOUNTER — Other Ambulatory Visit: Payer: Self-pay

## 2016-01-15 NOTE — Telephone Encounter (Signed)
PPA called to clarify fluconazole 139m script sent in at last office visit, could not find in [facility] referring to your visit note found that you ordered 2 courses plus 3 refills, told pharmacist that he could dispense 4 tablets per month w/ 3 additional refills, do you agree, I dont know why it did not go to [facility] could you please try to put in [facility]. Thanks

## 2016-01-15 NOTE — Telephone Encounter (Signed)
Yes.  Med list updated again.

## 2016-01-16 NOTE — Patient Outreach (Signed)
[facility]) Care Management   01/16/2016  [name] 12-02-1950 [id number]  [name] is an 65 y.o. female  Subjective:  I am not doing so good.  I have been depressed a long time. I am on oxygen, I have a little portable tank  I stay in the house most of the time.  Objective:   ROS  Elderly female, with oxygen via nasal cannula. Patient lives in single family home, home is cluttered, has a sickly   Physical Exam  ROS  Encounter Medications:   Outpatient Encounter Prescriptions as of 01/15/2016  Medication Sig Note  . albuterol (PROAIR HFA) 108 (90 Base) MCG/ACT inhaler Inhale 1-2 puffs into the lungs every 6 (six) hours as needed for shortness of breath.   . ALPRAZolam (XANAX) 1 MG tablet Take 0.5-1 tablets (0.5-1 mg total) by mouth 2 (two) times daily as needed for anxiety.   . Blood Glucose Monitoring Suppl (ONETOUCH VERIO) W/DEVICE KIT 1 each by Does not apply route 4 (four) times daily.   . chlorpheniramine (CHLOR-TRIMETON) 4 MG tablet Take 1 tablet (4 mg total) by mouth 3 (three) times daily as needed for allergies. 10/19/2014: ....  Marland Kitchen cyclobenzaprine (FLEXERIL) 5 MG tablet Take 1 tablet (5 mg total) by mouth 3 (three) times daily as needed for muscle spasms.   Marland Kitchen EASY TOUCH PEN NEEDLES 31G X 5 MM MISC USE TO INJECT INSULIN 5 TIMES DAILY   . ferrous gluconate (FERGON) 324 MG tablet Take 1 tablet (324 mg total) by mouth 2 (two) times daily with a meal.   . FLUoxetine (PROZAC) 20 MG tablet Take 2 tablets (40 mg total) by mouth daily.   . fluticasone (FLONASE) 50 MCG/ACT nasal spray Place 2 sprays into both nostrils daily.   . Fluticasone-Salmeterol (ADVAIR DISKUS) 500-50 MCG/DOSE AEPB Inhale 1 puff into the lungs 2 (two) times daily.   . furosemide (LASIX) 80 MG tablet Take 1 tablet (80 mg total) by mouth 2 (two) times daily.   Marland Kitchen gabapentin (NEURONTIN) 300 MG capsule Take 2 capsules (600 mg total) by mouth 3 (three) times daily.   Marland Kitchen  HYDROcodone-acetaminophen (NORCO/VICODIN) 5-325 MG tablet Take 1-2 tablets by mouth every 6 (six) hours as needed for severe pain.   Marland Kitchen insulin aspart (NOVOLOG) 100 UNIT/ML injection Use to fill VGo40 to wear daily   . Insulin Detemir (LEVEMIR FLEXTOUCH) 100 UNIT/ML Pen Inject 45 Units into the skin 2 (two) times daily.   . Insulin Disposable Pump (V-GO 40) KIT Fill and place VGo40 about the same time each day, use 4-5 clicks with meals and 2 clicks with snacks.   . Lancets Misc. (ACCU-CHEK FASTCLIX LANCET) KIT Check your blood 4 times a day dx code 250.00 insulin requiring   . lidocaine (XYLOCAINE) 2 % jelly APPLY TO AFFECTED AREA THREE TIMES A DAY   . nystatin cream (MYCOSTATIN) APPLY TOPICALLY TWO TIMES DAILY.   Marland Kitchen omeprazole (PRILOSEC) 40 MG capsule Take 1 capsule (40 mg total) by mouth 2 (two) times daily.   Glory Rosebush VERIO test strip Check blood sugar up to 4 times a day   . potassium chloride SA (K-DUR,KLOR-CON) 20 MEQ tablet TAKE 1 TABLET BY MOUTH TWICE DAILY   . promethazine (PHENERGAN) 12.5 MG tablet Take 1 tablet (12.5 mg total) by mouth every 8 (eight) hours as needed for nausea or vomiting.   . rosuvastatin (CRESTOR) 20 MG tablet TAKE 1 TABLET BY MOUTH EVERY NIGHT AT BEDTIME   . SPIRIVA  HANDIHALER 18 MCG inhalation capsule INHALE THE CONTENTS OF 1 CAPSULE VIA HANDIHALER BY MOUTH EVERY DAY   . warfarin (COUMADIN) 5 MG tablet TAKE 1/2 TABLET BY MOUTH EVERY DAY AT 6PM    No facility-administered encounter medications on file as of 01/15/2016.     Functional Status:   In your present state of health, do you have any difficulty performing the following activities: 01/15/2016 01/11/2016  Hearing? N N  Vision? Y N  Difficulty concentrating or making decisions? [name]  Walking or climbing stairs? Y Y  Dressing or bathing? Y Y  Doing errands, shopping? [name]  Preparing Food and eating ? N -  Using the Toilet? N -  In the past six months, have you accidently leaked urine? N -  Do you have  problems with loss of bowel control? N -  Managing your Medications? Y -  Managing your Finances? Y -  Housekeeping or managing your Housekeeping? Y -  Some recent data might be hidden    Fall/Depression Screening:    PHQ 2/9 Scores 01/11/2016 11/16/2015 09/28/2015 08/08/2015 07/12/2015 06/13/2015 05/29/2015  PHQ - 2 Score _0 PHQ- 9 Score - - _1 Fall Risk  01/11/2016 11/16/2015 09/28/2015 08/08/2015 07/12/2015  Falls in the past year? _2   Number falls in past yr: 2 or more 2 or more 2 or more 2 or more 2 or more  Injury with Fall? _3   Risk Factor Category  _4   Risk for fall due to : History of fall(s);Medication side effect;Impaired balance/gait - - Impaired balance/gait;Medication side effect;History of fall(s);Other (Comment) Impaired balance/gait;Other (Comment)  Risk for fall due to (comments): - - - home oxygen use oxygen used at home   Follow up Falls prevention discussed Falls prevention discussed Falls prevention discussed Falls prevention discussed Falls evaluation completed   Assessment:   limited resources, limited support system  Plan:   Home visit later this month

## 2016-01-17 ENCOUNTER — Encounter: Payer: Self-pay | Admitting: *Deleted

## 2016-01-21 IMAGING — CT CT ABDOMEN WO/W CM
2 of 5 series · 13 of 32 positions shown, 18 images · IV contrast (OMNIPAQUE 300)
Comparison: 08/25/2012

CLINICAL DATA: Followup left renal cell carcinoma. Two years status
post cryoablation. Abdominal pain. Chronic constipation.

EXAM:
CT ABDOMEN WITHOUT AND WITH CONTRAST
TECHNIQUE: Multidetector CT imaging of the abdomen was performed following the
standard protocol before and following the bolus administration of
intravenous contrast.
CONTRAST:  100mL OMNIPAQUE IOHEXOL 300 MG/ML  SOLN

[Series 2: abd/pel w/o · axial · non-contrast · 0.95mm/px · z∈[-228,-45]mm · 7 of 83 slices shown, 12 images]
[im 11/83  soft-tissue]
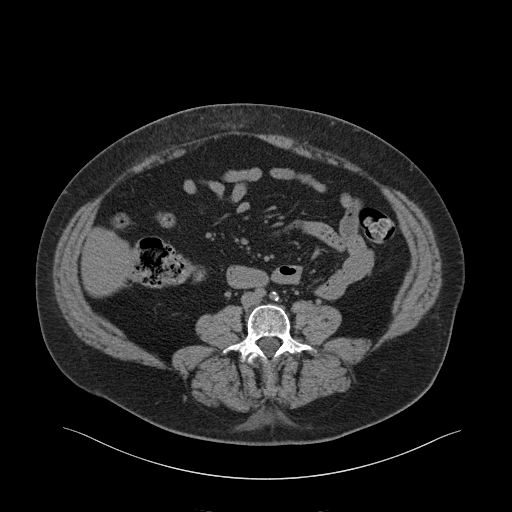
[im 11/83  bone]
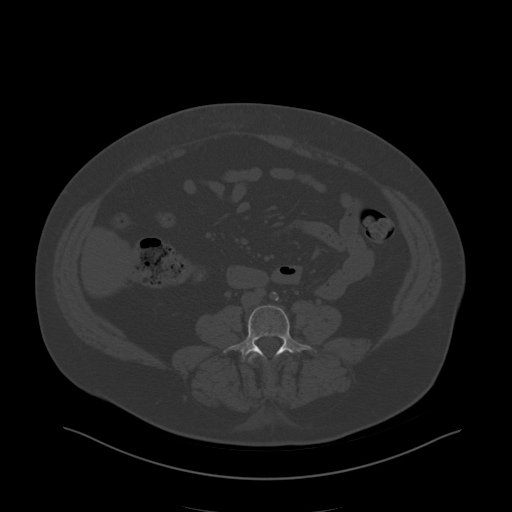
[im 21/83  soft-tissue]
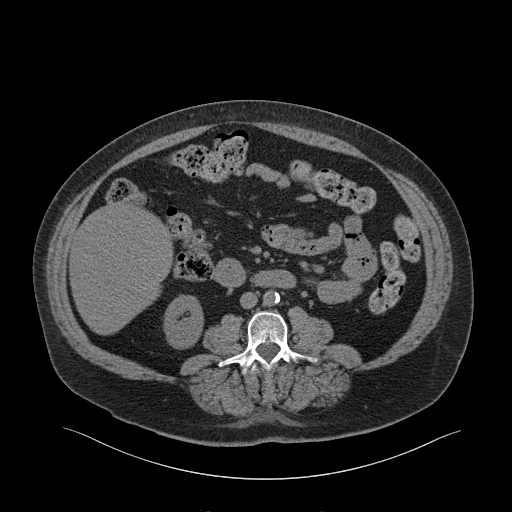
[im 31/83  soft-tissue]
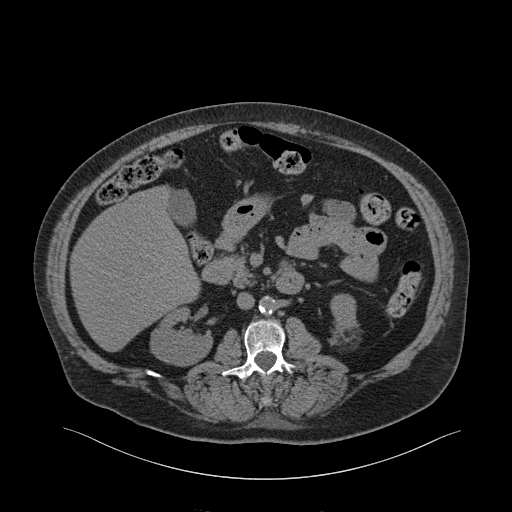
[im 42/83  soft-tissue]
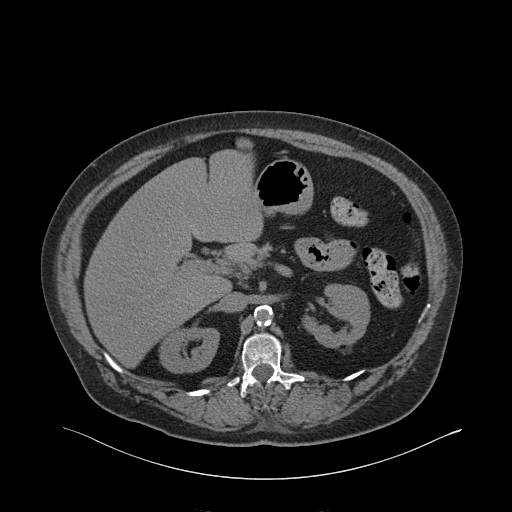
[im 42/83  lung]
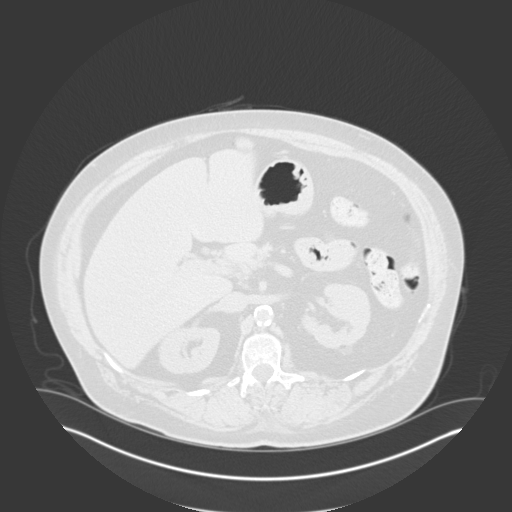
[im 52/83  soft-tissue]
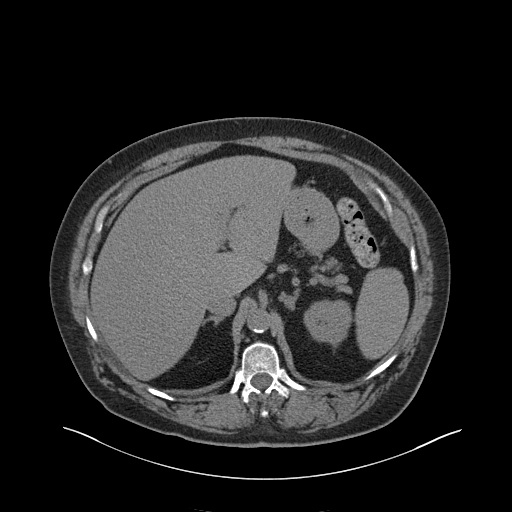
[im 52/83  lung]
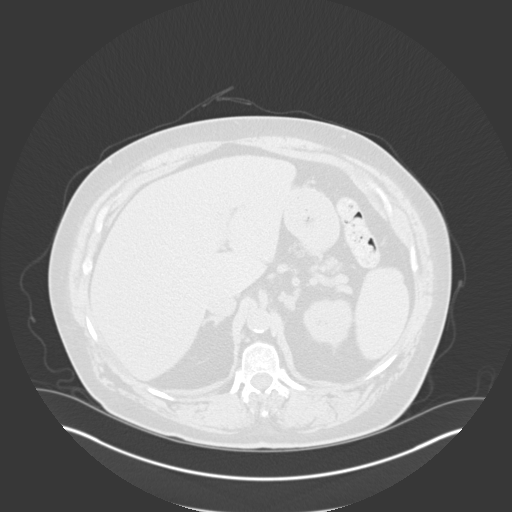
[im 62/83  soft-tissue]
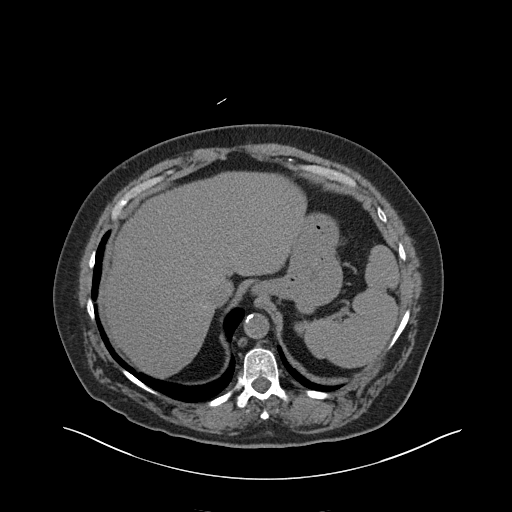
[im 62/83  lung]
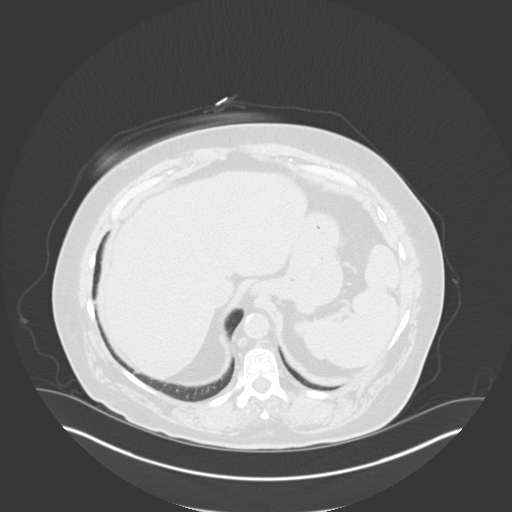
[im 72/83  soft-tissue]
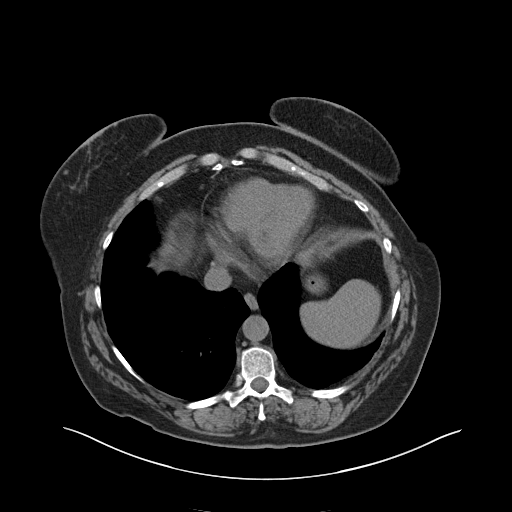
[im 72/83  lung]
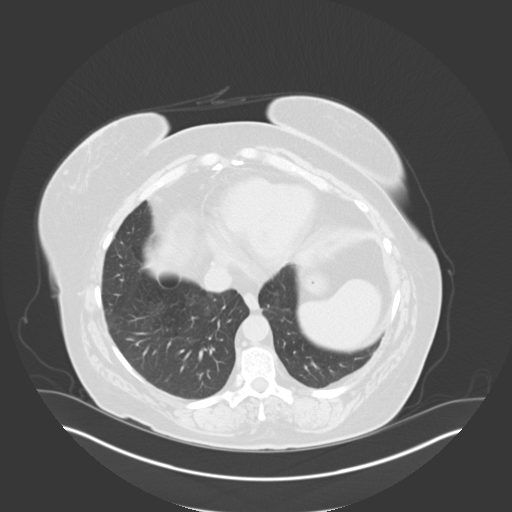

[Series 3: arterial phase · axial · arterial · 0.95mm/px · z∈[-210,-45]mm · 6 of 78 slices shown]
[im 12/78  soft-tissue]
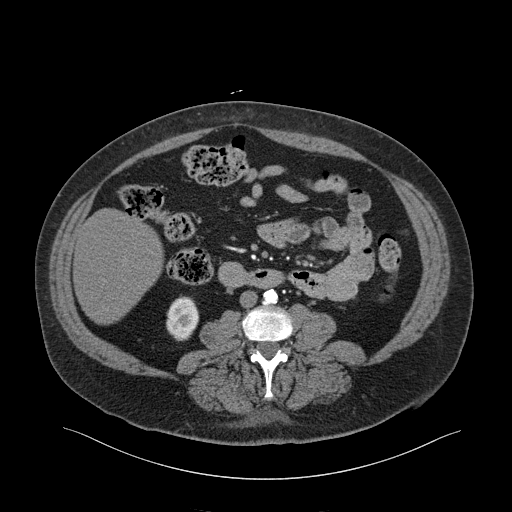
[im 23/78  soft-tissue]
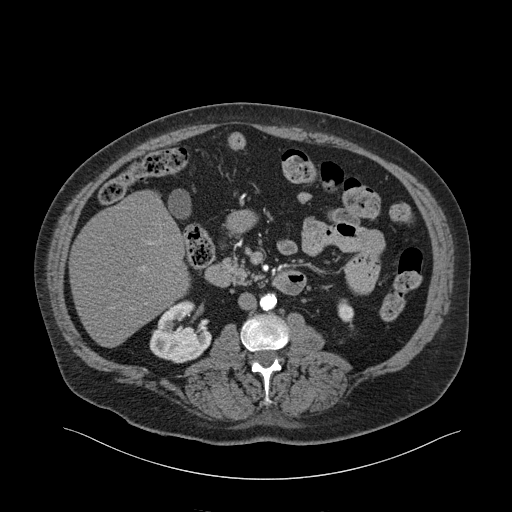
[im 34/78  soft-tissue]
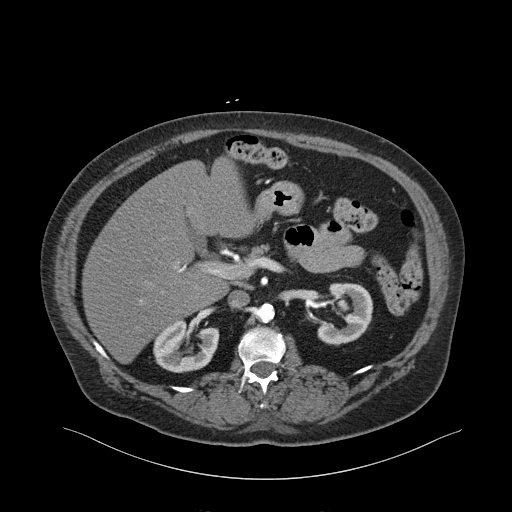
[im 45/78  soft-tissue]
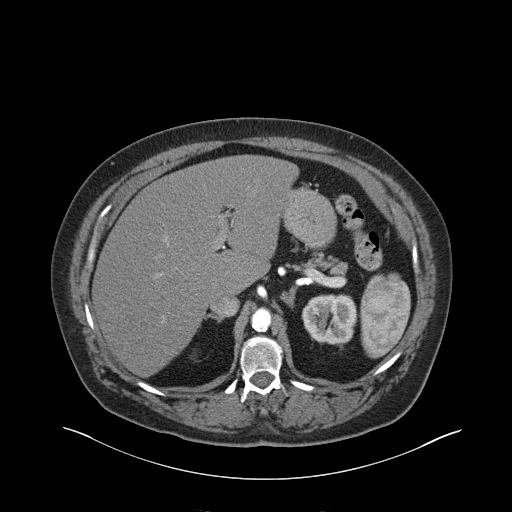
[im 56/78  soft-tissue]
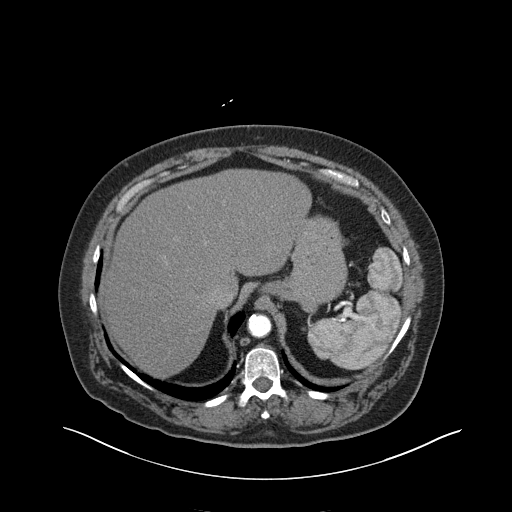
[im 67/78  soft-tissue]
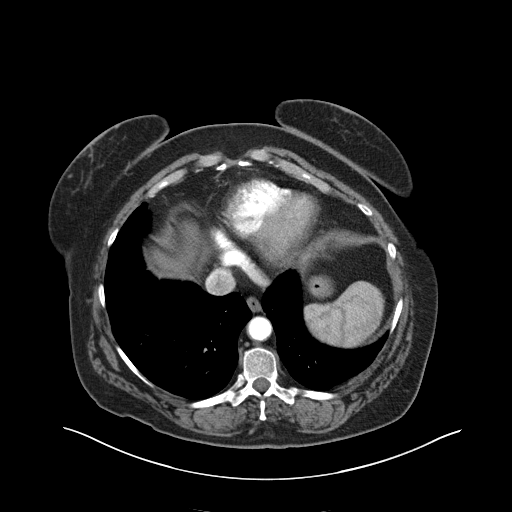

[13 of 32 positions shown; findings below may reference images not displayed]

FINDINGS: Precontrast images show 1 or 2 tiny less than 5 mm nonobstructive
calculi in the right kidney. Renal vascular calcification also
noted. No evidence of hydronephrosis.

Post-contrast phases show stable appearance of post cryoablation
changes along the posterior lower pole of the left kidney. No
evidence of recurrent or enlarging mass seen at this site. A small
simple cyst is noted in the right kidney however there is no
evidence of complex cystic or solid masses involving either kidney.

No evidence of retroperitoneal lymphadenopathy. The liver,
gallbladder, pancreas, spleen, all and adrenal glands are normal in
appearance. No other soft tissue masses are identified. No evidence
of inflammatory process or abnormal fluid collections within the
abdomen. No suspicious bone lesions identified.
IMPRESSION: Stable appearance of post cryoablation changes along the lower pole
of the left kidney. No evidence of recurrent or metastatic carcinoma
within the abdomen.

Tiny nonobstructive right renal calculi. No evidence of
hydronephrosis or other acute findings.

## 2016-01-24 ENCOUNTER — Ambulatory Visit
Admission: RE | Admit: 2016-01-24 | Discharge: 2016-01-24 | Disposition: A | Discharge status: Home / Self Care | Payer: PPO | Source: Ambulatory Visit | Attending: Internal Medicine | Admitting: Internal Medicine

## 2016-01-24 DIAGNOSIS — Z961 Presence of intraocular lens: Secondary | ICD-10-CM | POA: Diagnosis not present

## 2016-01-24 DIAGNOSIS — H53002 Unspecified amblyopia, left eye: Secondary | ICD-10-CM | POA: Diagnosis not present

## 2016-01-24 DIAGNOSIS — Z1231 Encounter for screening mammogram for malignant neoplasm of breast: Secondary | ICD-10-CM

## 2016-01-24 DIAGNOSIS — H04123 Dry eye syndrome of bilateral lacrimal glands: Secondary | ICD-10-CM | POA: Diagnosis not present

## 2016-01-24 DIAGNOSIS — E119 Type 2 diabetes mellitus without complications: Secondary | ICD-10-CM | POA: Diagnosis not present

## 2016-01-24 DIAGNOSIS — H26493 Other secondary cataract, bilateral: Secondary | ICD-10-CM | POA: Diagnosis not present

## 2016-01-24 LAB — HM DIABETES EYE EXAM

## 2016-01-25 ENCOUNTER — Ambulatory Visit: Payer: Self-pay | Admitting: *Deleted

## 2016-01-29 ENCOUNTER — Other Ambulatory Visit: Payer: Self-pay | Admitting: Internal Medicine

## 2016-01-29 DIAGNOSIS — F419 Anxiety disorder, unspecified: Secondary | ICD-10-CM

## 2016-01-29 NOTE — Telephone Encounter (Signed)
Called patient to make her aware xanax RX called to pharmacy

## 2016-01-31 ENCOUNTER — Other Ambulatory Visit: Payer: Self-pay

## 2016-01-31 NOTE — Patient Outreach (Signed)
[facility]) Care Management  01/31/2016  [name] 1950/11/03 [id number]   RNCM received telephone call from patient who stated she called to cancel today's appointment. Patient stated she did not sleep well, is now going back to bed.  Patient also requested a call after the new year to reschedule this appointment.  Plan: Make telephone contact next month to reschedule this home visit.

## 2016-02-06 ENCOUNTER — Other Ambulatory Visit: Payer: Self-pay | Admitting: *Deleted

## 2016-02-06 NOTE — Patient Outreach (Signed)
[facility]) Care Management  02/06/2016  [name] 1950-12-11 [id number]  CSW received a new referral on patient from patient's RNCM with [place] [facility], Erenest Rasher indicating that patient would benefit from social work services and resources to assist with referrals to various community agencies.  [name] reported that patient is interested in receiving psychosocial support, a referral to [employer] through [employer] of [place], a Geophysicist/field seismologist list to assist her in the home with activities of daily living and to receive a list of community agencies and resources that offer financial assistance. CSW made an initial attempt to try and contact patient today to perform phone assessment, as well as assess and assist with social needs and services, without success.  A HIPAA complaint message was left for patient on voicemail.  CSW is currently awaiting a return call.  CSW will make a second outreach attempt in one week, if CSW does not receive a return call from patient in the meantime. [name], BSW, MSW, LCSW  Licensed Education officer, environmental [facility]  Mailing [place] [address] [place], [place] [address] Physical Address-[address], [place], [place] [address] Toll Free Main # [phone] Fax # [phone] Cell # [phone]  Fax # [phone]  [email or web address]

## 2016-02-07 ENCOUNTER — Other Ambulatory Visit: Payer: Self-pay | Admitting: Internal Medicine

## 2016-02-07 DIAGNOSIS — E1140 Type 2 diabetes mellitus with diabetic neuropathy, unspecified: Secondary | ICD-10-CM

## 2016-02-07 DIAGNOSIS — F419 Anxiety disorder, unspecified: Secondary | ICD-10-CM

## 2016-02-07 DIAGNOSIS — Z794 Long term (current) use of insulin: Principal | ICD-10-CM

## 2016-02-08 ENCOUNTER — Telehealth: Payer: Self-pay | Admitting: Internal Medicine

## 2016-02-08 NOTE — Telephone Encounter (Signed)
Reason for call:   I received a call from [name] at 2:38 AM indicating that she was having a sensation of numbness in her left arm 2-3 hours after placing her V-Go patch.   Pertinent Data:   Patient has been using the V-Go insulin delivery system for 4 months now, tolerating it fairly well  She does use Lantus and Novolog as well, which she adjusts as needed  She placed her V-Go patch 2-3 hours ago on her left arm while watching TV, afterwards she noticed a sensation of numbness of the left arm without other symptoms aside from her self-reported chronic pain  Patient reports numbness is nearly resolved at time of return phone call  CBG during telephone conversation is 177 per patient report   Assessment / Plan / Recommendations:   Patient with left arm numbness likely related to placement of her V-Go insulin delivery system. Patient removed patch during telephone conversation and stated her arm was back to normal.  Patient speaking in complete sentences, pleasant, and jokng during conversation.  Reported CBG is 177. Patient will take her Lantus as usual and replace her V-Go at a different location in the morning.  She will follow up with [name], RD for further assistance.  I discussed warning signs/symptoms of stroke or heart attack for which patient is aware; she does not feel she is having symptoms of these, but does understand to go to the ED if they develop  As always, pt is advised that if symptoms worsen or new symptoms arise, they should go to an urgent care facility or to to ER for further evaluation.   [name], MD   02/08/2016, 2:59 AM

## 2016-02-13 NOTE — Telephone Encounter (Signed)
Xanax rx called to [place].

## 2016-02-14 ENCOUNTER — Other Ambulatory Visit: Payer: Self-pay | Admitting: *Deleted

## 2016-02-14 NOTE — Patient Outreach (Signed)
[facility]) Care Management  02/14/2016  [name] [birth year] [id number]   CSW made a second attempt to try and contact patient today to perform phone assessment, as well as assess and assist with social needs and services, without success.  A HIPAA complaint message was left for patient on voicemail.  CSW is currently awaiting a return call.  CSW will make a third and final attempt to try and contact patient in one week, if CSW does not receive a return call from patient in the meantime. [name], BSW, MSW, LCSW  Licensed Education officer, environmental [facility]  Mailing [place] [address], [place], [place] [address] Physical Address-[address], [place], [place] [address] Toll Free Main # [phone] Fax # [phone] Cell # [phone]  Fax # [phone]  [email or web address]

## 2016-02-15 ENCOUNTER — Telehealth: Payer: Self-pay | Admitting: Dietician

## 2016-02-15 NOTE — Telephone Encounter (Signed)
Called to discuss change in her levemir prescription: She says she Has not worn V-Go since 12/29, has gone back to Levemir 45 units twice a day, Novolog 18 units 3-4 times a day to which she adds the correction amount of Novolog  150-200- 5 units  200-250 10 units,  250-300 - 15 units,  300-350- 20 units,  350-400 25 units,  more than 400 she is supposed to call. Stopped the VGo because of problems with her skin/site- knots and dimpled skin on her Vgo sites. Running out of sites. Tried both arms and upper abdomen. Did not feel she was getting adequate control even with adding 20-40 units levemir per day. Discussed that I will contact the V-Go repo to discuss problems with V-Go sites/placement. Also to find out if more concentrated (u200 Lispro) insulin is indicated with the V-GO. [name] verbalized understanding that it would be next week before Iam abel to get back to her.

## 2016-02-18 ENCOUNTER — Ambulatory Visit (INDEPENDENT_AMBULATORY_CARE_PROVIDER_SITE_OTHER): Payer: PPO | Admitting: Internal Medicine

## 2016-02-18 ENCOUNTER — Ambulatory Visit (INDEPENDENT_AMBULATORY_CARE_PROVIDER_SITE_OTHER): Payer: PPO | Admitting: Pharmacist

## 2016-02-18 VITALS — BP 130/52 | HR 75 | Temp 98.3°F | Ht 62.0 in | Wt 197.5 lb

## 2016-02-18 DIAGNOSIS — R0602 Shortness of breath: Secondary | ICD-10-CM

## 2016-02-18 DIAGNOSIS — Z7901 Long term (current) use of anticoagulants: Secondary | ICD-10-CM

## 2016-02-18 DIAGNOSIS — F17210 Nicotine dependence, cigarettes, uncomplicated: Secondary | ICD-10-CM

## 2016-02-18 DIAGNOSIS — R5382 Chronic fatigue, unspecified: Secondary | ICD-10-CM | POA: Diagnosis not present

## 2016-02-18 DIAGNOSIS — I480 Paroxysmal atrial fibrillation: Secondary | ICD-10-CM | POA: Diagnosis not present

## 2016-02-18 DIAGNOSIS — D509 Iron deficiency anemia, unspecified: Secondary | ICD-10-CM | POA: Diagnosis not present

## 2016-02-18 DIAGNOSIS — D638 Anemia in other chronic diseases classified elsewhere: Secondary | ICD-10-CM

## 2016-02-18 DIAGNOSIS — Z7951 Long term (current) use of inhaled steroids: Secondary | ICD-10-CM

## 2016-02-18 DIAGNOSIS — R5383 Other fatigue: Secondary | ICD-10-CM | POA: Insufficient documentation

## 2016-02-18 LAB — POCT INR: INR: 3.1

## 2016-02-18 NOTE — Progress Notes (Signed)
Anti-Coagulation Progress Note  [name] is a 66 y.o. female who is currently on an anti-coagulation regimen.    RECENT RESULTS: Recent results are below, the most recent result is correlated with a dose of 20 mg. per week: Lab Results  Component Value Date   INR 3.10 02/18/2016   INR 1.4 01/11/2016   INR 2.1 11/16/2015    ANTI-COAG DOSE: Anticoagulation Dose Instructions as of 02/18/2016      [name] Tue Wed Thu Fri Sat   New Dose 2.5 mg 2.5 mg 2.5 mg 2.5 mg 2.5 mg 2.5 mg 2.5 mg    Description   Hold 2 doses      ANTICOAG SUMMARY: Anticoagulation Episode Summary    Current INR goal:   2.0-3.0  TTR:   79.8 % (3.1 y)  Next INR check:   03/17/2016  INR from last check:   3.10! (02/18/2016)  Weekly max dose:     Target end date:     INR check [facility]   [facility]  Preferred lab:     Send INR reminders to:   ANTICOAG [facility]   Indications   Paroxysmal atrial fibrillation ([place]) [I48.0] Long term current use of anticoagulant therapy [Z79.01]       Comments:         Anticoagulation Care Providers    Provider Role Specialty Phone number   Lelon Perla, MD  Cardiology [phone]      ANTICOAG TODAY: Anticoagulation Summary  As of 02/18/2016   INR goal:   2.0-3.0  TTR:     Today's INR:   3.10!  Next INR check:   03/17/2016  Target end date:      Indications   Paroxysmal atrial fibrillation ([place]) [I48.0] Long term current use of anticoagulant therapy [Z79.01]        Anticoagulation Episode Summary    INR check location[facility]   [facility]   Preferred lab:      Send INR reminders to:   ANTICOAG [facility] [address]   Comments:       Anticoagulation Care Providers    Provider Role Specialty Phone number   Lelon Perla, MD  Cardiology [phone]      PATIENT INSTRUCTIONS: Patient instructed to take medications as defined in the Anti-coagulation Track section of this encounter.  Patient instructed to take today's dose.   Patient instructed to take 1/2 of your peach colored 67m strength warfarin tablet(s) [facility] mouth [facility]  Patient verbalized understanding of these instructions.       FOLLOW-UP Return in 4 weeks (on 03/17/2016) for Follow up INR at 1100h.  JJorene Guest III Pharm.D., CACP

## 2016-02-18 NOTE — Progress Notes (Signed)
CC: Generalized Fatgiue  HPI:  KathrynKathryn Horn is a 66 y.o. female with a past medical history listed below here today with complaints of generalized fatigue as well as shortness of breath.  [name] reports that she has chronic generalized fatigue at baseline but has gotten worse over the past 1-2 weeks. She reports having no energy and is tires easily. Also notes having increased shorteness fo breath fom baseline. She is on Advair and Spriva curerntly and normally does not requre any albuterol. States taht over the past week she has had use her albuterol inhlaer 4/7 days once daily. Reports productive cough unchanged from her baseline. Has chronic clear sputum production with no increase in production on increased purulence. No fevers or chills but reports she has alternating episodes of feelign cold and then hot with drenchign sweats but this is a chronic problem. Continues to smoke with no plans to quit.  Has a history of chronic anemia both of chronic diease and iron deficiency anemia. Reports that at one point she had to be hospitalized and received 3 units of blood. States this feels similar to how she felt at that time. Denies any overt bleeding and compliance with her Fe supplementation. Reports no sick contacts, no rhinorrhea, sinus congestion or pressure, ear pain, sore throat, headache. Weight today is stable from prior.  Last TSH 12/2014 1.08.  PH9 17 today  Past Medical History:  Diagnosis Date  . Adenomatous polyps 05/14/2011   Colonoscopy (05/2011): 4 mm adenomatous polyp excised endoscopically Colonoscopy (02/2002): Adenomatous polyp excised endoscopically   . Allergic rhinitis 06/01/2012  . Anemia of chronic disease 01/01/2013  . Anxiety 07/24/2010  . Aortic atherosclerosis ([place]) 10/19/2014   Seen on CT scan, currently asymptomatic  . 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  Asymptomatic cholelithiasis 09/25/2015   Seen on CT scan 08/2015  . Bilateral cataracts 08/04/2012   Visually insignificant   . Carotid artery stenosis    s/p right endarterectomy (06/2010) Carotid US (07/2010):  Left: Moderate-to-severe (60-79%) calcific and non-calcific plaque origin and proximal ICA and ECA   . Chronic congestive heart failure with left ventricular diastolic dysfunction ([place]) 10/21/2010  . Chronic constipation 02/03/2011  . Chronic daily headache 01/16/2014  . Chronic low back pain 10/06/2012  . Chronic venous insufficiency 08/04/2012  . Clear cell renal cell carcinoma ([place]) 07/21/2011   s/p cryoablation of left RCC in 09/2011 by [name]. Followed by [name]  [facility]) .    Marland Kitchen COPD (chronic obstructive pulmonary disease) with emphysema (HCC)    PFTs 02/2012: FEV1 0.92 (40%), ratio 69, 27% increase in FEV1 with BD, TLC 91%, severe airtrapping, DLCO49% On chronic home O2. Pulmonary rehab referral 05/2012   . Depression 11/19/2005  . Esophageal stenosis 08/08/2015   Mild, benign-appearing on EGD 06/29/2015  . Fibromyalgia 08/29/2010  . Gastroesophageal reflux disease   . History of blood transfusion    "several times"   . Hyperlipidemia LDL goal < 100 11/20/2005  . Internal hemorrhoids 08/04/2012  . Mitral stenosis    s/p Mitral valve replacement with a 27-mm pericardial porcine valve (Medtronic Mosaic valve, serial #[id number] on 09/20/10, Dr. Prescott Gum)   . Moderate to severe pulmonary hypertension    2014 TEE w PA peak pressure 46 mmHg, s/p MV replacement   . Obesity (BMI 30.0-34.9) 10/23/2011  . Obstructive sleep apnea    Nocturnal polysomnography (06/2009): Moderate sleep apnea/ hypopnea  syndrome , AHI 17.8 per hour with nonpositional hypopneas. CPAP titration to 12 CWP, AHI 2.4 per hour. On nocturnal CPAP via a small resMed Quattro full-face mask with heated humidifier.   . Osteoporosis    DEXA (12/09/2011): L-spine T -3.7, left hip T -1.4 DEXA (12/2004):  L-spine T -2.6, left hip -0.1   . Paroxysmal atrial fibrillation ([place]) 10/22/2010   s/p Left atrial maze procedure for paroxysmal atrial fibrillation on 09/20/2010 by Dr Prescott Gum.  Subsequent splenic infarct, decision was made to re-anticoagulate with coumadin, likely life-long as this is the most likely cause of the splenic infarct.   . Right nephrolithiasis 09/06/2014   5 mm non-obstructing calculus seen on CT scan 09/05/2014   . Seborrheic keratosis 09/28/2015  . Shortness of breath dyspnea   . Tobacco abuse 07/28/2012  . Type 2 diabetes mellitus with diabetic neuropathy (HCC)     Review of Systems:   Negative except as noted in HPI  Physical Exam:  Vitals:   02/18/16 1116  BP: (!) 130/52  Pulse: 75  Temp: 98.3 F (36.8 C)  TempSrc: Oral  SpO2: 97%  Weight: 197 lb 8 oz (89.6 kg)  Height: _0  (1.575 m)   Physical Exam  Constitutional: She is well-developed, well-nourished, and in no distress. No distress.  HENT:  Head: Normocephalic and atraumatic.  Mouth/Throat: Oropharynx is clear and moist.  Eyes: Conjunctivae and EOM are normal. Pupils are equal, round, and reactive to light.  Pink mucous membranes  Cardiovascular: Normal rate, regular rhythm and normal heart sounds.   Pulmonary/Chest: Effort normal and breath sounds normal. She has no wheezes. She has no rales.  Abdominal: Soft. Bowel sounds are normal. She exhibits no distension. There is no tenderness.  Skin: Skin is warm and dry.  Psychiatric: Mood normal.   Assessment & Plan:   See Encounters Tab for problem based charting.  Patient discussed with Dr. Angelia Mould

## 2016-02-18 NOTE — Progress Notes (Signed)
INTERNAL MEDICINE TEACHING ATTENDING ADDENDUM - Lucious Groves, DO Duration- lifelong, Indication- PAF, INR-  Lab Results  Component Value Date   INR 3.10 02/18/2016  . Agree with pharmacy recommendations as outlined in their note.

## 2016-02-18 NOTE — Patient Instructions (Signed)
Patient instructed to take medications as defined in the Anti-coagulation Track section of this encounter.  Patient instructed to take today's dose.  Patient instructed to take 1/2 of your peach colored 79m strength warfarin tablet(s) by mouth once-daily at [facility].  Patient verbalized understanding of these instructions.

## 2016-02-18 NOTE — Patient Instructions (Signed)
Ms. Mcquitty,   I am going to check your blood counts today. This may be what is causing your to feel so tired and run down. The labs take a day or so to come back. I will call you with the results tomorrow.  I would like you to follow up with Dr. Eppie Gibson in 2 weeks.

## 2016-02-18 NOTE — Assessment & Plan Note (Signed)
[name] reports that she has chronic generalized fatigue at baseline but has gotten worse over the past 1-2 weeks. She reports having no energy and is tires easily. Also notes having increased shorteness fo breath fom baseline. She is on Advair and Spriva curerntly and normally does not requre any albuterol. States taht over the past week she has had use her albuterol inhlaer 4/7 days once daily. Reports productive cough unchanged from her baseline. Has chronic clear sputum production with no increase in production on increased purulence. No fevers or chills but reports she has alternating episodes of feelign cold and then hot with drenchign sweats but this is a chronic problem. Continues to smoke with no plans to quit.  Has a history of chronic anemia both of chronic diease and iron deficiency anemia. Reports that at one point she had to be hospitalized and received 3 units of blood. States this feels similar to how she felt at that time. Denies any overt bleeding and compliance with her Fe supplementation. Reports no sick contacts, no rhinorrhea, sinus congestion or pressure, ear pain, sore throat, headache. Weight today is stable from prior.  Last TSH 12/2014 1.08.  PHQ9 17 today  Plan PHQ 9 worse today than previously. She has good cap refill and pink mucus membranes. Low suspicion that her anemia is playing a role but will check CBC today. It is highly likely that this is related to her depression and chronic illnesses. She is already on Prozac 40 mg daily. May need addition of secondary agent is symptoms not improved.  She does have increased albuterol use but lungs clear today and symptoms well controlled overall.   -F/U CBC

## 2016-02-19 LAB — CBC
Hematocrit: 29.9 % — ABNORMAL LOW (ref 34.0–46.6)
Hemoglobin: 9.2 g/dL — ABNORMAL LOW (ref 11.1–15.9)
MCH: 26.5 pg — ABNORMAL LOW (ref 26.6–33.0)
MCHC: 30.8 g/dL — ABNORMAL LOW (ref 31.5–35.7)
MCV: 86 fL (ref 79–97)
Platelets: 191 10*3/uL (ref 150–379)
RBC: 3.47 x10E6/uL — ABNORMAL LOW (ref 3.77–5.28)
RDW: 18 % — ABNORMAL HIGH (ref 12.3–15.4)
WBC: 7.2 10*3/uL (ref 3.4–10.8)

## 2016-02-19 NOTE — Progress Notes (Signed)
Internal Medicine Clinic Attending  Case discussed with Dr. Boswell at the time of the visit.  We reviewed the resident's history and exam and pertinent patient test results.  I agree with the assessment, diagnosis, and plan of care documented in the resident's note.  

## 2016-02-20 ENCOUNTER — Telehealth: Payer: Self-pay | Admitting: Internal Medicine

## 2016-02-20 ENCOUNTER — Encounter: Payer: Self-pay | Admitting: *Deleted

## 2016-02-20 NOTE — Telephone Encounter (Signed)
Pt requesting to speak with Bonnita Nasuti.  Please call pt back

## 2016-02-21 NOTE — Telephone Encounter (Signed)
Spoke w/ pt for 30 min 1/10, she is depressed and needed to talk

## 2016-02-22 ENCOUNTER — Other Ambulatory Visit: Payer: Self-pay

## 2016-02-22 ENCOUNTER — Encounter: Payer: Self-pay | Admitting: *Deleted

## 2016-02-22 ENCOUNTER — Encounter: Payer: PPO | Admitting: Internal Medicine

## 2016-02-22 ENCOUNTER — Other Ambulatory Visit: Payer: Self-pay | Admitting: *Deleted

## 2016-02-22 NOTE — Patient Outreach (Signed)
     Unsuccessful attempt made to contact patient to reschedule home visit cancelled by patient before Christmas. Patient had requested a call after the New Year to reschedule the home visit. HIPPA compliant message left with this RNCM's contact information and request to return call.

## 2016-02-22 NOTE — Telephone Encounter (Signed)
Talked to patient about using tegaderm on her skin under the [place]. She wants to try them and then we will touch base and if they work, look into how she can purchase/afford the tegaderm

## 2016-02-22 NOTE — Patient Outreach (Signed)
[facility]) Care Management  02/22/2016  [name] [birth year] [id number]   CSW made a third and final attempt to try and contact patient today to perform phone assessment, as well as assess and assist with social work needs and services, without success.  A HIPAA compliant message was left for patient on voicemail.  CSW  continues to await a return call.  CSW will mail an outreach letter to patient's home, encouraging patient to contact CSW at their earliest convenience, if patient is interested in receiving social work services through [facility] with [facility].  If CSW does not receive a return call from patient within the next 10 business days, CSW will proceed with case closure.  Required number of phone attempts will have been made and outreach letter mailed.   [name], BSW, MSW, LCSW  Licensed Education officer, environmental Health System  Mailing [place] [address], [place], [place] [address] Physical Address-[address], [place], [place] [address] Toll Free Main # [phone] Fax # [phone] Cell # [phone]  Fax # [phone]  [email or web address]

## 2016-02-27 ENCOUNTER — Other Ambulatory Visit: Payer: Self-pay

## 2016-02-27 NOTE — Patient Outreach (Signed)
Telephone call received from patient who agreed to home visit later this month. Patient advised that [facility] LCSW, [name], has been attempting to call.

## 2016-02-27 NOTE — Patient Outreach (Signed)
   Unsuccessful attempt made to contact patient for community care coordination. HIPPA compliant message left for patient with this RNCM's contact information. This RNCM has been unsuccessful in maintaining contact with patient  Plan: Discharge patient from caseload due to inability to maintain contact.

## 2016-02-29 ENCOUNTER — Other Ambulatory Visit: Payer: Self-pay | Admitting: *Deleted

## 2016-02-29 NOTE — Patient Outreach (Signed)
[facility]) Care Management  02/29/2016  HERMINE FERIA 09-Apr-1950 [id number]   CSW received a voicemail message from patient, indicating that she would like to talk with CSW about financial resources, but that she plans to lay down for a bit and that she will attempt to outreach to [place] later today.  CSW will await a return call from patient. [name], BSW, MSW, LCSW  Licensed Education officer, environmental Health System  Mailing [place] [address], [place], [place] [address] Physical Address-[address], [place], [place] [address] Toll Free Main # [phone] Fax # [phone] Cell # [phone]  Fax # [phone]  [email or web address]

## 2016-03-03 ENCOUNTER — Other Ambulatory Visit: Payer: Self-pay | Admitting: *Deleted

## 2016-03-03 ENCOUNTER — Other Ambulatory Visit: Payer: Self-pay | Admitting: Internal Medicine

## 2016-03-03 ENCOUNTER — Other Ambulatory Visit (HOSPITAL_COMMUNITY): Payer: Self-pay | Admitting: Interventional Radiology

## 2016-03-03 DIAGNOSIS — Z794 Long term (current) use of insulin: Principal | ICD-10-CM

## 2016-03-03 DIAGNOSIS — C642 Malignant neoplasm of left kidney, except renal pelvis: Secondary | ICD-10-CM

## 2016-03-03 DIAGNOSIS — I480 Paroxysmal atrial fibrillation: Secondary | ICD-10-CM

## 2016-03-03 DIAGNOSIS — E1140 Type 2 diabetes mellitus with diabetic neuropathy, unspecified: Secondary | ICD-10-CM

## 2016-03-04 ENCOUNTER — Other Ambulatory Visit: Payer: Self-pay | Admitting: *Deleted

## 2016-03-04 ENCOUNTER — Other Ambulatory Visit: Payer: Self-pay

## 2016-03-04 ENCOUNTER — Encounter: Payer: Self-pay | Admitting: *Deleted

## 2016-03-04 NOTE — Patient Outreach (Signed)
[facility]) Care Management  03/04/2016  [name] February 12, 1950 [id number]  CSW was able to make initial contact with patient today, at the assistance of patient's RNCM, Erenest Rasher, also with [facility], who contacted CSW while in the presence of the patient, to get CSW and patient connected.  CSW was able to perform the initial phone assessment, as well as assess and assist with social work needs and services.  CSW introduced self, explained role and types of services provided through [facility] ([facility]).  CSW further explained to patient that CSW works with [name], who thought that patient would benefit from social work services and resources to assist with finances and referrals to various community agenices.  CSW obtained two HIPAA compliant identifiers from patient, which included patient's name and date of birth.  Patient admitted that she did not wish to get into "all her needs" over the phone, requesting to schedule a home visit with CSW.  Initial home visit with patient has been scheduled for Thursday, January 25th at 1:00pm.  CSW will take a packet of resource information to patient's home for review and CSW will assist with completion of applications, as well as referrals to various community agencies and resources. [name], BSW, MSW, LCSW  Licensed Education officer, environmental Health System  Mailing [place] [address], [place], [place] [address] Physical Address-[address], [place], [place] [address] Toll Free Main # [phone] Fax # [phone] Cell # [phone]  Office # [phone] [email or web address]

## 2016-03-05 NOTE — Patient Outreach (Signed)
[facility]) Care Management   03/04/2016  [name] 1950/06/19 [id number]  [name] is an 66 y.o. female  Subjective:  I did not sleep last night.  Objective:   ROS Elderly, full  Size lady with very pale skin  Physical Exam  ROS  Encounter Medications:   Outpatient Encounter Prescriptions as of 03/04/2016  Medication Sig Note  . albuterol (PROAIR HFA) 108 (90 Base) MCG/ACT inhaler Inhale 1-2 puffs into the lungs every 6 (six) hours as needed for shortness of breath.   . ALPRAZolam (XANAX) 1 MG tablet Take 0.5-1 tablets (0.5-1 mg total) by mouth 2 (two) times daily as needed for anxiety.   . Blood Glucose Monitoring Suppl (ONETOUCH VERIO) W/DEVICE KIT 1 each by Does not apply route 4 (four) times daily.   . chlorpheniramine (CHLOR-TRIMETON) 4 MG tablet Take 1 tablet (4 mg total) by mouth 3 (three) times daily as needed for allergies. 10/19/2014: ....  Marland Kitchen cyclobenzaprine (FLEXERIL) 5 MG tablet Take 1 tablet (5 mg total) by mouth 3 (three) times daily as needed for muscle spasms.   Marland Kitchen EASY TOUCH PEN NEEDLES 31G X 5 MM MISC USE TO INJECT INSULIN 5 TIMES DAILY   . ferrous gluconate (FERGON) 324 MG tablet Take 1 tablet (324 mg total) by mouth 2 (two) times daily with a meal.   . fluconazole (DIFLUCAN) 150 MG tablet Take 150 mg by mouth once.   Marland Kitchen FLUoxetine (PROZAC) 20 MG tablet Take 2 tablets (40 mg total) by mouth daily.   . fluticasone (FLONASE) 50 MCG/ACT nasal spray Place 2 sprays into both nostrils daily.   . Fluticasone-Salmeterol (ADVAIR DISKUS) 500-50 MCG/DOSE AEPB Inhale 1 puff into the lungs 2 (two) times daily.   . furosemide (LASIX) 80 MG tablet Take 1 tablet (80 mg total) by mouth 2 (two) times daily.   Marland Kitchen gabapentin (NEURONTIN) 300 MG capsule Take 2 capsules (600 mg total) by mouth 3 (three) times daily.   Marland Kitchen HYDROcodone-acetaminophen (NORCO/VICODIN) 5-325 MG tablet Take 1-2 tablets by mouth every 6 (six) hours as needed for severe pain.   .  Insulin Detemir (LEVEMIR FLEXTOUCH) 100 UNIT/ML Pen Inject 20 Units into the skin daily at 10 pm.   . Insulin Disposable Pump (V-GO 40) KIT FILL AND PLACE VGO40 ABOUT THE SAME TIME EACH DAY. USE 4 TO 5 CLICKS WITH MEALS AND 2 CLICKS WITH SNACKS   . Lancets Misc. (ACCU-CHEK FASTCLIX LANCET) KIT Check your blood 4 times a day dx code 250.00 insulin requiring   . lidocaine (XYLOCAINE) 2 % jelly APPLY TO AFFECTED AREA THREE TIMES A DAY   . NOVOLOG 100 UNIT/ML injection USE TO FILL VGO40 TO WEAR DAILY.   Marland Kitchen nystatin cream (MYCOSTATIN) APPLY TOPICALLY TWO TIMES DAILY.   Marland Kitchen omeprazole (PRILOSEC) 40 MG capsule Take 1 capsule (40 mg total) by mouth 2 (two) times daily.   Glory Rosebush VERIO test strip Check blood sugar up to 4 times a day   . potassium chloride SA (K-DUR,KLOR-CON) 20 MEQ tablet TAKE 1 TABLET BY MOUTH TWICE DAILY   . promethazine (PHENERGAN) 12.5 MG tablet Take 1 tablet (12.5 mg total) by mouth every 8 (eight) hours as needed for nausea or vomiting.   . rosuvastatin (CRESTOR) 20 MG tablet TAKE 1 TABLET BY MOUTH EVERY NIGHT AT BEDTIME   . SPIRIVA HANDIHALER 18 MCG inhalation capsule INHALE THE CONTENTS OF 1 CAPSULE VIA HANDIHALER BY MOUTH EVERY DAY   . warfarin (COUMADIN) 5 MG tablet Take  0.5 tablets (2.5 mg total) by mouth daily at 6 PM.    No facility-administered encounter medications on file as of 03/04/2016.     Functional Status:   In your present state of health, do you have any difficulty performing the following activities: 03/04/2016 02/18/2016  Hearing? N N  Vision? N N  Difficulty concentrating or making decisions? Y Y  Walking or climbing stairs? Y Y  Dressing or bathing? Y Y  Doing errands, shopping? Y Y  Preparing Food and eating ? N -  Using the Toilet? N -  In the past six months, have you accidently leaked urine? N -  Do you have problems with loss of bowel control? N -  Managing your Medications? N -  Managing your Finances? Y -  Housekeeping or managing your  Housekeeping? Y -  Some recent data might be hidden    Fall/Depression Screening:    PHQ 2/9 Scores 03/04/2016 02/18/2016 01/11/2016 11/16/2015 09/28/2015 08/08/2015 07/12/2015  PHQ - 2 Score 1 0 1 1 1 1 6  PHQ- 9 Score - - - - 8 8 12    [facility] CM Care Plan Problem One   Flowsheet Row Most Recent Value  Care Plan Problem One  (P) patient has multiple chronic illnesses  Role Documenting the Problem One  (P) Care Management Coordinator  Care Plan for Problem One  (P) Active  [facility] Long Term Goal (31-90 days)  (P) In the next 31 days, member will meet with [facility] RNCM for assessment of chronic disease educational needs  [facility] Long Term Goal Start Date  (P) 03/04/16  Interventions for Problem One Long Term Goal  (P) initial home viist to assess needs after several attempts  [facility] CM Short Term Goal #1 (0-30 days)  (P) In the next 28 days, patient will meet with [facility] LCSW for psycosocial assessment   [facility] CM Short Term Goal #1 Start Date  (P) 02/26/16  Interventions for Short Term Goal #1  (P) contact made with LCSW for initation of contact  to arrange telephone contact in the nearfuture     Assessment:   Patient admits to not always being motivated in managing her chronic illnesses. Patient states she is very sedentary, has very little social life.  Patient is agreeable to meeting wit this RNCM for chronic disease education. Patient is current with [facility] LCSW for psychosocial support  Plan:  Home visit in 2 weeks for community care coordination.    

## 2016-03-06 ENCOUNTER — Other Ambulatory Visit: Payer: Self-pay | Admitting: *Deleted

## 2016-03-06 NOTE — Patient Outreach (Signed)
[facility]) Care Management  03/06/2016  [name] 1950/10/12 [id number]   CSW was scheduled to meet with patient today at her home to perform an initial home visit, however, patient was not available at the time of CSW's visit.  CSW rang patient's doorbell several times, as well as knocked loudly on patient's door, with no answer.  CSW sat in patient's driveway for 35 minutes, intermittently calling patient's phone and knocking on patient's door, again without success.  CSW left HIPAA compliant voicemail messages for patient; however, patient did not answer, nor did patient return CSW's call.  CSW will continue to try and outreach to patient to reschedule the initial home visit.  If CSW does not receive a return call from patient within the next week, CSW will make an outreach attempt to try and reschedule the visit. [name], BSW, MSW, LCSW  Licensed Education officer, environmental Health System  Mailing [place] [address] [place], [place] [address] Physical Address-[address], [place], [place] [address] Toll Free Main # [phone] Fax # [phone] Cell # [phone]  Office # [phone] [email or web address]

## 2016-03-07 ENCOUNTER — Other Ambulatory Visit: Payer: Self-pay | Admitting: *Deleted

## 2016-03-07 ENCOUNTER — Ambulatory Visit: Payer: Self-pay | Admitting: *Deleted

## 2016-03-07 NOTE — Patient Outreach (Signed)
[facility]) Care Management  03/07/2016  [name] Jun 24, 1950 [id number]   CSW received a return call from patient today, very apologetic about having missed the initial home visit with CSW yesterday (Thursday, January 25th).  Patient admitted to [facility] that she completely forgot all about the appointment and must have been in a deep sleep at the time of CSW's arrival.  Patient indicated that the doorbell, nor her phone ringing, could awake her.  CSW performed an assessment with patient over the phone, allowing patient an opportunity to vent her frustrations and concerns about her life and symptoms she is experiencing as a result of diagnoses of Anxiety and Depression.  CSW agreed to mail patient a list of therapists and counselors that are in network with her insurance provider, [employer].  Once patient has had an opportunity to review the list, CSW agreed to assist with the referral process. CSW remembers having worked extensively with patient in the past, in which she requested to receive free ongoing counseling services by CSW.  After goals of care were met and patient was discharged from the [facility] program, patient was reassigned to a [facility] colleague, shortly thereafter, due to reassignment of caseloads due to geographic location.  Patient worked with CSW's colleague, until the colleague left the [employer].  Several months went by before a third referral was placed for patient, requesting that CSW provide psychiatric services to patient.  Patient was followed for a brief period of time by another [facility] colleague, but again, discharged from the program because patient declined services at that time. Patient admitted to [facility] that she does not eat healthy, nor is she good about eating at least three meals per day.  Patient stated, "All I ever really do is sleep, so I often miss at least one meal a day, sometimes two, depending on the  day".  Patient went on to say that she does not check her mail but once a week, which is when her sister comes to provide her her groceries, simply because patient refuses to leave the house, even though she is capable.  Patient denies feeling homicidal and/or suicidal at this time.  Patient admitted that her deterrent from "killing herself" is that she fears she would "go to hell".  Patient was agreeable to having CSW make a referral for her to [employer] through [employer] [facility] [place].  CSW agreed to follow-up with patient in one week, to ensure that patient received the packet of resource information mailed to her home, as well as answer any questions that patient may have at that time.  Patient voiced understanding and was agreeable to this plan. [name], BSW, MSW, LCSW  Licensed Education officer, environmental Health System  Mailing [place] [address] [place], [place] [address] Physical Address-[address], [place], [place] [address] Toll Free Main # [phone] Fax # [phone] Cell # [phone]  Office # [phone] [email or web address]

## 2016-03-10 NOTE — Patient Outreach (Signed)
[facility]) Care Management  03/10/2016  [name] [birth year] [id number]   Request received from [name], LCSW to mail patient welcome packet, consent, community housing solutions information and family services of the [employer].   Jacqulynn Cadet  [facility] Care Management Assistant

## 2016-03-11 ENCOUNTER — Telehealth: Payer: Self-pay | Admitting: Dietician

## 2016-03-11 NOTE — Telephone Encounter (Signed)
V-Go follow up- not using it because she is not doing well. she says she is depressed.Not eating well.  She is taking injections at least twice a day. 35 levemir and 18 plus correction of the Novolog. Has not opened the tegaderm . one time a day. Checking blood sugar one time a day.  Skin about the same as when she was using the vgo.

## 2016-03-14 ENCOUNTER — Other Ambulatory Visit: Payer: Self-pay | Admitting: *Deleted

## 2016-03-14 NOTE — Patient Outreach (Signed)
[facility]) Care Management  03/14/2016  [name] 11-16-1950 [id number]   CSW was able to make contact with patient today to follow-up regarding social work services and resources, as well as to ensure that patient received the packet of resource information CSW mailed to patient's home. Patient admitted that she has received the packet, but that she has been "too lazy" to even open the envelope.  CSW encouraged patient to do so, so that CSW can answer any questions that patient may have, as well as schedule counseling and supportive services for patient, if necessary.  Patient agreed to review the packet of resource information over the weekend and contact CSW one day next week to discuss the contents.  If CSW does not receive a return call from patient next week, CSW will make an outreach call to patient on Tuesday, February 13th. Patient spoke with CSW about her concerns regarding veterinary services, and how she has been unable to get anyone from the vet's office to call her back, despite the fact that it has been 4 months.  CSW inquired as to whether or not patient has an outstanding bill that she has not paid, but patient denied.  CSW also inquired as to whether or not patient had a "falling out" with anyone at the vet's office, which again, patient denied.  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  Patient voiced understanding and was agreeable to this plan. [name], BSW, MSW, LCSW  Licensed Education officer, [facility]  Mailing [place] [address], [name], [place] [address] Physical Address-[address], [place], [place] [address] Toll Free Main # [phone] Fax # [phone] Cell # [phone]  Office # [phone] [email or web address]

## 2016-03-17 ENCOUNTER — Ambulatory Visit: Payer: PPO

## 2016-03-18 ENCOUNTER — Other Ambulatory Visit: Payer: Self-pay

## 2016-03-19 NOTE — Patient Outreach (Signed)
[facility] Care Management   03/18/2016  [name] [birth year] [id number]  [name] is an 66 y.o. female  Subjective:  I did not sleep well last night. I have not been been doing a good job os keeping up with my meals  Objective:   ROS   Very pleasant, elderly lady with neatly short cut hair.   Physical Exam ROS   Encounter Medications:   Outpatient Encounter Prescriptions as of 03/18/2016  Medication Sig Note  . albuterol (PROAIR HFA) 108 (90 Base) MCG/ACT inhaler Inhale 1-2 puffs into the lungs every 6 (six) hours as needed for shortness of breath.   . ALPRAZolam (XANAX) 1 MG tablet Take 0.5-1 tablets (0.5-1 mg total) by mouth 2 (two) times daily as needed for anxiety.   . Blood Glucose Monitoring Suppl (ONETOUCH VERIO) W/DEVICE KIT 1 each by Does not apply route 4 (four) times daily.   . chlorpheniramine (CHLOR-TRIMETON) 4 MG tablet Take 1 tablet (4 mg total) by mouth 3 (three) times daily as needed for allergies. 10/19/2014: ....  Marland Kitchen cyclobenzaprine (FLEXERIL) 5 MG tablet Take 1 tablet (5 mg total) by mouth 3 (three) times daily as needed for muscle spasms.   Marland Kitchen EASY TOUCH PEN NEEDLES 31G X 5 MM MISC USE TO INJECT INSULIN 5 TIMES DAILY   . ferrous gluconate (FERGON) 324 MG tablet Take 1 tablet (324 mg total) by mouth 2 (two) times daily with a meal.   . fluconazole (DIFLUCAN) 150 MG tablet Take 150 mg by mouth once.   Marland Kitchen FLUoxetine (PROZAC) 20 MG tablet Take 2 tablets (40 mg total) by mouth daily.   . fluticasone (FLONASE) 50 MCG/ACT nasal spray Place 2 sprays into both nostrils daily.   . Fluticasone-Salmeterol (ADVAIR DISKUS) 500-50 MCG/DOSE AEPB Inhale 1 puff into the lungs 2 (two) times daily.   . furosemide (LASIX) 80 MG tablet Take 1 tablet (80 mg total) by mouth 2 (two) times daily.   Marland Kitchen gabapentin (NEURONTIN) 300 MG capsule Take 2 capsules (600 mg total) by mouth 3 (three) times daily.   Marland Kitchen HYDROcodone-acetaminophen (NORCO/VICODIN) 5-325 MG  tablet Take 1-2 tablets by mouth every 6 (six) hours as needed for severe pain.   . Insulin Detemir (LEVEMIR FLEXTOUCH) 100 UNIT/ML Pen Inject 20 Units into the skin daily at 10 pm.   . Insulin Disposable Pump (V-GO 40) KIT FILL AND PLACE VGO40 ABOUT THE SAME TIME EACH DAY. USE 4 TO 5 CLICKS WITH MEALS AND 2 CLICKS WITH SNACKS   . Lancets Misc. (ACCU-CHEK FASTCLIX LANCET) KIT Check your blood 4 times a day dx code 250.00 insulin requiring   . lidocaine (XYLOCAINE) 2 % jelly APPLY TO AFFECTED AREA THREE TIMES A DAY   . NOVOLOG 100 UNIT/ML injection USE TO FILL VGO40 TO WEAR DAILY.   Marland Kitchen nystatin cream (MYCOSTATIN) APPLY TOPICALLY TWO TIMES DAILY.   Marland Kitchen omeprazole (PRILOSEC) 40 MG capsule Take 1 capsule (40 mg total) by mouth 2 (two) times daily.   Glory Rosebush VERIO test strip Check blood sugar up to 4 times a day   . potassium chloride SA (K-DUR,KLOR-CON) 20 MEQ tablet TAKE 1 TABLET BY MOUTH TWICE DAILY   . promethazine (PHENERGAN) 12.5 MG tablet Take 1 tablet (12.5 mg total) by mouth every 8 (eight) hours as needed for nausea or vomiting.   . rosuvastatin (CRESTOR) 20 MG tablet TAKE 1 TABLET BY MOUTH EVERY NIGHT AT BEDTIME   . SPIRIVA HANDIHALER 18 MCG inhalation capsule INHALE THE  CONTENTS OF 1 CAPSULE VIA HANDIHALER BY MOUTH EVERY DAY   . warfarin (COUMADIN) 5 MG tablet Take 0.5 tablets (2.5 mg total) by mouth daily at 6 PM.    No facility-administered encounter medications on file as of 03/18/2016.     Functional Status:   In your present state of health, do you have any difficulty performing the following activities: 03/04/2016 02/18/2016  Hearing? N N  Vision? N N  Difficulty concentrating or making decisions? [name]  Walking or climbing stairs? Y Y  Dressing or bathing? Y Y  Doing errands, shopping? [name]  Preparing Food and eating ? N -  Using the Toilet? N -  In the past six months, have you accidently leaked urine? N -  Do you have problems with loss of bowel control? N -  Managing your  Medications? N -  Managing your Finances? Y -  Housekeeping or managing your Housekeeping? Y -  Some recent data might be hidden    Fall/Depression Screening:    PHQ 2/9 Scores 03/04/2016 02/18/2016 01/11/2016 11/16/2015 09/28/2015 08/08/2015 07/12/2015  PHQ - 2 Score 1 0 _0 PHQ- 9 Score - - - - _1 [facility] CM Care Plan Problem One   Flowsheet Row Most Recent Value  Care Plan Problem One  Patient admits to not eating healthy and skipping meals.  Role Documenting the Problem One  Clinical Social Worker  Care Plan for Problem One  Active  [facility] Long Term Goal (31-90 days)  In the next 31 days, patient will write down at least 28 meals out of possible 77  [place] Term Goal Start Date  03/04/16  Interventions for Problem One Long Term Goal  Home visit to assess patient's progress in meeting this case management goals  [facility] CM Short Term Goal #1 (0-30 days)  Patient will be placed on waiting list for mobile meals within the next week.  [facility] CM Short Term Goal #1 Start Date  03/07/16  [facility] CM Short Term Goal #1 Met Date  03/07/16  Interventions for Short Term Goal #1  CSW will make a referral for patient to [employer].    [facility] CM Care Plan Problem Two   Flowsheet Row Most Recent Value  Care Plan Problem Two  Patient suffers from anxiety and depression.  Role Documenting the Problem Two  Clinical Social Worker  Care Plan for Problem Two  Active  [facility] CM Short Term Goal #1 (0-30 days)  Patient is interested in receiving counseling and supportive services for depression and anxiety, within the next two weeks.  [facility] CM Short Term Goal #1 Start Date  03/07/16  [facility] CM Short Term Goal #1 Met Date   03/14/16  Interventions for Short Term Goal #2   CSW will mail patient a packet of resource information, providing a list of counselors/therapists that are in network with her insurance.     Assessment:   Patient has made very little progression in meeting her case management goals. Patient continues to  endorse a sedentary lifestyle, consuming high sodium food contents. Patient also reports not weighing daily. Patient and RNCM viewed EMMI video on Diabetes, patient also states she is going back to view EMMI videos subscribed to her email. Plan:  Home visit within the next 30 days for diabetic education

## 2016-03-25 ENCOUNTER — Other Ambulatory Visit: Payer: Self-pay | Admitting: *Deleted

## 2016-03-25 NOTE — Patient Outreach (Signed)
[facility]) Care Management  03/25/2016  [name] [birth year] [id number]    CSW made an attempt to try and contact patient today to follow-up regarding social work services and resources; however, patient was not available at the time of CSW's call.  A HIPAA compliant message was left for patient on voicemail.  CSW is currently awaiting a return call.  CSW will make a second outreach attempt in a little more than a week, when CSW returns from vacation, if CSW does not receive a return call from patient in the meantime. [name], BSW, MSW, LCSW  Licensed Education officer, [facility]  Mailing [place] [address] [place], [place] [address] Physical Address-[address], [place], [place] [address] Toll Free Main # [phone] Fax # [phone] Cell # [phone]  Office # [phone] [email or web address]

## 2016-03-27 ENCOUNTER — Telehealth: Payer: Self-pay | Admitting: *Deleted

## 2016-03-27 ENCOUNTER — Other Ambulatory Visit: Payer: Self-pay

## 2016-03-27 ENCOUNTER — Encounter (HOSPITAL_COMMUNITY): Payer: Self-pay | Admitting: *Deleted

## 2016-03-27 ENCOUNTER — Inpatient Hospital Stay (HOSPITAL_COMMUNITY)
Admission: EM | Admit: 2016-03-27 | Discharge: 2016-03-29 | DRG: 378 | Disposition: A | Discharge status: Home / Self Care | Payer: PPO | Attending: Internal Medicine | Admitting: Internal Medicine

## 2016-03-27 ENCOUNTER — Inpatient Hospital Stay (HOSPITAL_COMMUNITY): Payer: PPO

## 2016-03-27 DIAGNOSIS — K449 Diaphragmatic hernia without obstruction or gangrene: Secondary | ICD-10-CM | POA: Diagnosis present

## 2016-03-27 DIAGNOSIS — I110 Hypertensive heart disease with heart failure: Secondary | ICD-10-CM | POA: Diagnosis present

## 2016-03-27 DIAGNOSIS — E119 Type 2 diabetes mellitus without complications: Secondary | ICD-10-CM | POA: Diagnosis not present

## 2016-03-27 DIAGNOSIS — E1140 Type 2 diabetes mellitus with diabetic neuropathy, unspecified: Secondary | ICD-10-CM | POA: Diagnosis present

## 2016-03-27 DIAGNOSIS — J439 Emphysema, unspecified: Secondary | ICD-10-CM | POA: Diagnosis not present

## 2016-03-27 DIAGNOSIS — Z6836 Body mass index (BMI) 36.0-36.9, adult: Secondary | ICD-10-CM | POA: Diagnosis not present

## 2016-03-27 DIAGNOSIS — G8929 Other chronic pain: Secondary | ICD-10-CM | POA: Diagnosis not present

## 2016-03-27 DIAGNOSIS — Z9981 Dependence on supplemental oxygen: Secondary | ICD-10-CM | POA: Diagnosis not present

## 2016-03-27 DIAGNOSIS — R5084 Febrile nonhemolytic transfusion reaction: Secondary | ICD-10-CM | POA: Diagnosis not present

## 2016-03-27 DIAGNOSIS — M810 Age-related osteoporosis without current pathological fracture: Secondary | ICD-10-CM | POA: Diagnosis not present

## 2016-03-27 DIAGNOSIS — R109 Unspecified abdominal pain: Secondary | ICD-10-CM | POA: Diagnosis not present

## 2016-03-27 DIAGNOSIS — F329 Major depressive disorder, single episode, unspecified: Secondary | ICD-10-CM | POA: Diagnosis present

## 2016-03-27 DIAGNOSIS — Z85528 Personal history of other malignant neoplasm of kidney: Secondary | ICD-10-CM

## 2016-03-27 DIAGNOSIS — Z952 Presence of prosthetic heart valve: Secondary | ICD-10-CM | POA: Diagnosis not present

## 2016-03-27 DIAGNOSIS — G4733 Obstructive sleep apnea (adult) (pediatric): Secondary | ICD-10-CM | POA: Diagnosis present

## 2016-03-27 DIAGNOSIS — K5900 Constipation, unspecified: Secondary | ICD-10-CM | POA: Diagnosis not present

## 2016-03-27 DIAGNOSIS — Z8249 Family history of ischemic heart disease and other diseases of the circulatory system: Secondary | ICD-10-CM

## 2016-03-27 DIAGNOSIS — F17210 Nicotine dependence, cigarettes, uncomplicated: Secondary | ICD-10-CM | POA: Diagnosis present

## 2016-03-27 DIAGNOSIS — I5032 Chronic diastolic (congestive) heart failure: Secondary | ICD-10-CM | POA: Diagnosis not present

## 2016-03-27 DIAGNOSIS — I5033 Acute on chronic diastolic (congestive) heart failure: Secondary | ICD-10-CM | POA: Diagnosis present

## 2016-03-27 DIAGNOSIS — D62 Acute posthemorrhagic anemia: Secondary | ICD-10-CM | POA: Diagnosis not present

## 2016-03-27 DIAGNOSIS — Z7901 Long term (current) use of anticoagulants: Secondary | ICD-10-CM | POA: Diagnosis not present

## 2016-03-27 DIAGNOSIS — N390 Urinary tract infection, site not specified: Secondary | ICD-10-CM | POA: Diagnosis not present

## 2016-03-27 DIAGNOSIS — K5521 Angiodysplasia of colon with hemorrhage: Secondary | ICD-10-CM | POA: Diagnosis not present

## 2016-03-27 DIAGNOSIS — K5520 Angiodysplasia of colon without hemorrhage: Secondary | ICD-10-CM

## 2016-03-27 DIAGNOSIS — Z794 Long term (current) use of insulin: Secondary | ICD-10-CM

## 2016-03-27 DIAGNOSIS — D500 Iron deficiency anemia secondary to blood loss (chronic): Secondary | ICD-10-CM | POA: Diagnosis present

## 2016-03-27 DIAGNOSIS — F419 Anxiety disorder, unspecified: Secondary | ICD-10-CM | POA: Diagnosis not present

## 2016-03-27 DIAGNOSIS — D649 Anemia, unspecified: Secondary | ICD-10-CM | POA: Diagnosis present

## 2016-03-27 DIAGNOSIS — M545 Low back pain, unspecified: Secondary | ICD-10-CM | POA: Diagnosis present

## 2016-03-27 DIAGNOSIS — F321 Major depressive disorder, single episode, moderate: Secondary | ICD-10-CM | POA: Diagnosis present

## 2016-03-27 DIAGNOSIS — M797 Fibromyalgia: Secondary | ICD-10-CM | POA: Diagnosis not present

## 2016-03-27 DIAGNOSIS — J449 Chronic obstructive pulmonary disease, unspecified: Secondary | ICD-10-CM | POA: Diagnosis not present

## 2016-03-27 DIAGNOSIS — I480 Paroxysmal atrial fibrillation: Secondary | ICD-10-CM | POA: Diagnosis not present

## 2016-03-27 DIAGNOSIS — Z86010 Personal history of colonic polyps: Secondary | ICD-10-CM

## 2016-03-27 DIAGNOSIS — D638 Anemia in other chronic diseases classified elsewhere: Secondary | ICD-10-CM | POA: Diagnosis present

## 2016-03-27 DIAGNOSIS — Z87442 Personal history of urinary calculi: Secondary | ICD-10-CM

## 2016-03-27 DIAGNOSIS — B9689 Other specified bacterial agents as the cause of diseases classified elsewhere: Secondary | ICD-10-CM | POA: Diagnosis not present

## 2016-03-27 DIAGNOSIS — I272 Pulmonary hypertension, unspecified: Secondary | ICD-10-CM | POA: Diagnosis not present

## 2016-03-27 DIAGNOSIS — E1169 Type 2 diabetes mellitus with other specified complication: Secondary | ICD-10-CM | POA: Diagnosis present

## 2016-03-27 DIAGNOSIS — R1032 Left lower quadrant pain: Secondary | ICD-10-CM | POA: Diagnosis not present

## 2016-03-27 DIAGNOSIS — K922 Gastrointestinal hemorrhage, unspecified: Secondary | ICD-10-CM

## 2016-03-27 DIAGNOSIS — C649 Malignant neoplasm of unspecified kidney, except renal pelvis: Secondary | ICD-10-CM | POA: Diagnosis present

## 2016-03-27 DIAGNOSIS — R110 Nausea: Secondary | ICD-10-CM | POA: Diagnosis not present

## 2016-03-27 DIAGNOSIS — K219 Gastro-esophageal reflux disease without esophagitis: Secondary | ICD-10-CM | POA: Diagnosis not present

## 2016-03-27 DIAGNOSIS — Z8669 Personal history of other diseases of the nervous system and sense organs: Secondary | ICD-10-CM | POA: Diagnosis present

## 2016-03-27 DIAGNOSIS — E118 Type 2 diabetes mellitus with unspecified complications: Secondary | ICD-10-CM | POA: Diagnosis present

## 2016-03-27 DIAGNOSIS — R1031 Right lower quadrant pain: Secondary | ICD-10-CM | POA: Diagnosis not present

## 2016-03-27 DIAGNOSIS — R1084 Generalized abdominal pain: Secondary | ICD-10-CM | POA: Diagnosis not present

## 2016-03-27 DIAGNOSIS — E785 Hyperlipidemia, unspecified: Secondary | ICD-10-CM | POA: Diagnosis present

## 2016-03-27 DIAGNOSIS — F322 Major depressive disorder, single episode, severe without psychotic features: Secondary | ICD-10-CM | POA: Diagnosis present

## 2016-03-27 DIAGNOSIS — Z953 Presence of xenogenic heart valve: Secondary | ICD-10-CM

## 2016-03-27 DIAGNOSIS — Q2733 Arteriovenous malformation of digestive system vessel: Secondary | ICD-10-CM | POA: Diagnosis not present

## 2016-03-27 DIAGNOSIS — I509 Heart failure, unspecified: Secondary | ICD-10-CM | POA: Diagnosis present

## 2016-03-27 DIAGNOSIS — K2970 Gastritis, unspecified, without bleeding: Secondary | ICD-10-CM | POA: Diagnosis not present

## 2016-03-27 DIAGNOSIS — K921 Melena: Secondary | ICD-10-CM | POA: Diagnosis not present

## 2016-03-27 DIAGNOSIS — Z9119 Patient's noncompliance with other medical treatment and regimen: Secondary | ICD-10-CM

## 2016-03-27 LAB — COMPREHENSIVE METABOLIC PANEL
ALT: 12 U/L — ABNORMAL LOW (ref 14–54)
AST: 17 U/L (ref 15–41)
Albumin: 3.3 g/dL — ABNORMAL LOW (ref 3.5–5.0)
Alkaline Phosphatase: 41 U/L (ref 38–126)
Anion gap: 9 (ref 5–15)
BUN: 13 mg/dL (ref 6–20)
CO2: 27 mmol/L (ref 22–32)
Calcium: 8.5 mg/dL — ABNORMAL LOW (ref 8.9–10.3)
Chloride: 96 mmol/L — ABNORMAL LOW (ref 101–111)
Creatinine, Ser: 0.93 mg/dL (ref 0.44–1.00)
GFR calc Af Amer: >60 mL/min (ref >60)
GFR calc non Af Amer: >60 mL/min (ref >60)
Glucose, Bld: 263 mg/dL — ABNORMAL HIGH (ref 65–99)
Potassium: 4.2 mmol/L (ref 3.5–5.1)
Sodium: 132 mmol/L — ABNORMAL LOW (ref 135–145)
Total Bilirubin: 0.3 mg/dL (ref 0.3–1.2)
Total Protein: 6.2 g/dL — ABNORMAL LOW (ref 6.5–8.1)

## 2016-03-27 LAB — CBC
HCT: 21.1 % — ABNORMAL LOW (ref 36.0–46.0)
Hemoglobin: 6.3 g/dL — CL (ref 12.0–15.0)
MCH: 24.8 pg — ABNORMAL LOW (ref 26.0–34.0)
MCHC: 29.9 g/dL — ABNORMAL LOW (ref 30.0–36.0)
MCV: 83.1 fL (ref 78.0–100.0)
Platelets: 193 10*3/uL (ref 150–400)
RBC: 2.54 MIL/uL — ABNORMAL LOW (ref 3.87–5.11)
RDW: 18.3 % — ABNORMAL HIGH (ref 11.5–15.5)
WBC: 12 10*3/uL — ABNORMAL HIGH (ref 4.0–10.5)

## 2016-03-27 LAB — URINALYSIS, ROUTINE W REFLEX MICROSCOPIC
Bilirubin Urine: NEGATIVE
Glucose, UA: >=500 mg/dL — AB
Ketones, ur: NEGATIVE mg/dL
Nitrite: NEGATIVE
Protein, ur: NEGATIVE mg/dL
Specific Gravity, Urine: 1.006 (ref 1.005–1.030)
pH: 7 (ref 5.0–8.0)

## 2016-03-27 LAB — IRON AND TIBC
Iron: 22 ug/dL — ABNORMAL LOW (ref 28–170)
Saturation Ratios: 5 % — ABNORMAL LOW (ref 10.4–31.8)
TIBC: 445 ug/dL (ref 250–450)
UIBC: 423 ug/dL

## 2016-03-27 LAB — I-STAT CG4 LACTIC ACID, ED
Lactic Acid, Venous: 1.76 mmol/L (ref 0.5–1.9)
Lactic Acid, Venous: 2.04 mmol/L (ref 0.5–1.9)

## 2016-03-27 LAB — LIPASE, BLOOD: Lipase: 11 U/L (ref 11–51)

## 2016-03-27 LAB — LACTATE DEHYDROGENASE: LDH: 133 U/L (ref 98–192)

## 2016-03-27 LAB — PROTIME-INR
INR: 2.54
Prothrombin Time: 27.9 seconds — ABNORMAL HIGH (ref 11.4–15.2)

## 2016-03-27 LAB — FERRITIN: Ferritin: 5 ng/mL — ABNORMAL LOW (ref 11–307)

## 2016-03-27 LAB — RETICULOCYTES
RBC.: 2.7 MIL/uL — ABNORMAL LOW (ref 3.87–5.11)
Retic Count, Absolute: 108 10*3/uL (ref 19.0–186.0)
Retic Ct Pct: 4 % — ABNORMAL HIGH (ref 0.4–3.1)

## 2016-03-27 LAB — PREPARE RBC (CROSSMATCH)

## 2016-03-27 MED ORDER — FLUOXETINE HCL 20 MG PO CAPS
40.0000 mg | ORAL_CAPSULE | Freq: Every day | ORAL | Status: DC
  Administered 2016-03-28 – 2016-03-29 (×2): 40 mg via ORAL
  Filled 2016-03-27 (×3): qty 2

## 2016-03-27 MED ORDER — ONDANSETRON HCL 4 MG/2ML IJ SOLN
4.0000 mg | Freq: Once | INTRAMUSCULAR | Status: AC
  Administered 2016-03-27: 4 mg via INTRAVENOUS
  Filled 2016-03-27: qty 2

## 2016-03-27 MED ORDER — PANTOPRAZOLE SODIUM 40 MG IV SOLR
40.0000 mg | Freq: Two times a day (BID) | INTRAVENOUS | Status: DC
  Administered 2016-03-28 – 2016-03-29 (×4): 40 mg via INTRAVENOUS
  Filled 2016-03-27 (×4): qty 40

## 2016-03-27 MED ORDER — ALBUTEROL SULFATE (2.5 MG/3ML) 0.083% IN NEBU
2.5000 mg | INHALATION_SOLUTION | Freq: Four times a day (QID) | RESPIRATORY_TRACT | Status: DC | PRN
  Administered 2016-03-27: 2.5 mg via RESPIRATORY_TRACT
  Filled 2016-03-27: qty 3

## 2016-03-27 MED ORDER — IPRATROPIUM-ALBUTEROL 0.5-2.5 (3) MG/3ML IN SOLN
RESPIRATORY_TRACT | Status: AC
  Administered 2016-03-27: 3 mL
  Filled 2016-03-27: qty 3

## 2016-03-27 MED ORDER — ACETAMINOPHEN 325 MG PO TABS
650.0000 mg | ORAL_TABLET | Freq: Once | ORAL | Status: AC
  Administered 2016-03-27: 650 mg via ORAL
  Filled 2016-03-27: qty 2

## 2016-03-27 MED ORDER — IOPAMIDOL (ISOVUE-300) INJECTION 61%
INTRAVENOUS | Status: AC
  Filled 2016-03-27: qty 100

## 2016-03-27 MED ORDER — TIOTROPIUM BROMIDE MONOHYDRATE 18 MCG IN CAPS
18.0000 ug | ORAL_CAPSULE | Freq: Every day | RESPIRATORY_TRACT | Status: DC
  Administered 2016-03-28 – 2016-03-29 (×2): 18 ug via RESPIRATORY_TRACT
  Filled 2016-03-27: qty 5

## 2016-03-27 MED ORDER — INSULIN ASPART 100 UNIT/ML ~~LOC~~ SOLN
0.0000 [IU] | Freq: Three times a day (TID) | SUBCUTANEOUS | Status: DC
  Administered 2016-03-28: 5 [IU] via SUBCUTANEOUS
  Administered 2016-03-28: 2 [IU] via SUBCUTANEOUS
  Administered 2016-03-28: 5 [IU] via SUBCUTANEOUS
  Administered 2016-03-29: 3 [IU] via SUBCUTANEOUS

## 2016-03-27 MED ORDER — INSULIN DETEMIR 100 UNIT/ML ~~LOC~~ SOLN
20.0000 [IU] | Freq: Every day | SUBCUTANEOUS | Status: DC
  Administered 2016-03-27 – 2016-03-28 (×2): 20 [IU] via SUBCUTANEOUS
  Filled 2016-03-27 (×3): qty 0.2

## 2016-03-27 MED ORDER — DEXTROSE 5 % IV SOLN
1.0000 g | INTRAVENOUS | Status: DC
  Administered 2016-03-28 (×2): 1 g via INTRAVENOUS
  Filled 2016-03-27 (×4): qty 10

## 2016-03-27 MED ORDER — ALPRAZOLAM 0.25 MG PO TABS
0.5000 mg | ORAL_TABLET | Freq: Two times a day (BID) | ORAL | Status: DC | PRN
  Administered 2016-03-28: 1 mg via ORAL
  Filled 2016-03-27: qty 4

## 2016-03-27 MED ORDER — INSULIN ASPART 100 UNIT/ML ~~LOC~~ SOLN
3.0000 [IU] | Freq: Three times a day (TID) | SUBCUTANEOUS | Status: DC
  Administered 2016-03-28 – 2016-03-29 (×3): 3 [IU] via SUBCUTANEOUS

## 2016-03-27 MED ORDER — FENTANYL CITRATE (PF) 100 MCG/2ML IJ SOLN
50.0000 ug | Freq: Once | INTRAMUSCULAR | Status: AC
  Administered 2016-03-27: 50 ug via INTRAVENOUS
  Filled 2016-03-27: qty 2

## 2016-03-27 MED ORDER — SODIUM CHLORIDE 0.9 % IV SOLN
Freq: Once | INTRAVENOUS | Status: AC
  Administered 2016-03-27: 18:00:00 via INTRAVENOUS

## 2016-03-27 MED ORDER — DIPHENHYDRAMINE HCL 25 MG PO CAPS
25.0000 mg | ORAL_CAPSULE | Freq: Once | ORAL | Status: AC
  Administered 2016-03-27: 25 mg via ORAL
  Filled 2016-03-27: qty 1

## 2016-03-27 MED ORDER — PANTOPRAZOLE SODIUM 40 MG IV SOLR
40.0000 mg | Freq: Once | INTRAVENOUS | Status: AC
  Administered 2016-03-27: 40 mg via INTRAVENOUS
  Filled 2016-03-27: qty 40

## 2016-03-27 MED ORDER — NICOTINE 7 MG/24HR TD PT24
7.0000 mg | MEDICATED_PATCH | Freq: Every day | TRANSDERMAL | Status: DC
  Administered 2016-03-27 – 2016-03-29 (×3): 7 mg via TRANSDERMAL
  Filled 2016-03-27 (×3): qty 1

## 2016-03-27 MED ORDER — GABAPENTIN 300 MG PO CAPS
600.0000 mg | ORAL_CAPSULE | Freq: Three times a day (TID) | ORAL | Status: DC
  Administered 2016-03-27 – 2016-03-29 (×5): 600 mg via ORAL
  Filled 2016-03-27 (×5): qty 2

## 2016-03-27 MED ORDER — NYSTATIN 100000 UNIT/GM EX CREA
TOPICAL_CREAM | Freq: Two times a day (BID) | CUTANEOUS | Status: DC
  Administered 2016-03-27 – 2016-03-29 (×4): via TOPICAL
  Filled 2016-03-27: qty 15

## 2016-03-27 NOTE — ED Notes (Signed)
Admit MD at bedside wants Blood transfusion started again same Unit #[id number] D started for patient at this time

## 2016-03-27 NOTE — Telephone Encounter (Signed)
[facility] nurse calls and states pt called her and stated she was having abd pain, nausea and had 1 black tarry stool today, she was going to get appt in clinic for pt but pt called back and stated she was calling 911. [facility] nurse will f/u w/ pt and call triage back, appt made for tomorrow pm in clinic in case pt did not go to ED

## 2016-03-27 NOTE — ED Notes (Signed)
Pt appears to have prolonged expiratory phase with audible wheezing noted pt is able to wake up and answer questions appropriately with verbal stimuli RT called and admit team paged pt remains on monitor with Blood transfusing at this time

## 2016-03-27 NOTE — H&P (Signed)
Date: 03/27/2016               Patient Name:  [name] MRN: [id number]  DOB: 04/17/1950 Age / Sex: 66 y.o., female   PCP: [name], MD         Medical Service: Internal Medicine Teaching Service         Attending Physician: Dr. [name], MD    First Contact: Dr. Lorella Nimrod Pager: [phone]  Second Contact: Dr. [name] Pager: [phone]       After Hours (After 5p /  First Contact Pager: [phone]  Weekends / Holidays): Second Contact Pager: [phone]   Chief Complaint: abd pain, dark stool  History of Present Illness: [name] is a 66 y.o. female with a h/o of PAF on coumadin, HFpEF G2DD, CAS s/p endarterectomy, PAH s/p MVR for mitral stenosis, t2DM, HTN, HLD, COPD, OSA, osteroporosis, depression/anxiety, h/o clear cell RCC, GERD, internal hemorrhoids, h/o gastritis, h/o SB AVMs, AoCD, who presents with 1 day h/o abdominal pain, and dark stools.  Patient reports that she has chronic abdominal pain which has worsened roughly 2 months ago. She reports that this pain is intermittent and diffuse, and may be associated with some constipation. Recently patient notes that she has had several episodes of dark stool 1 several weeks ago and most recently one today. He was significant volume of diarrhea, and are not associated with significant worsening in her abdominal pain today however she noted that her abdominal pain was constant, although it was not worse in its intensity. She describes one episode of small bowel movement that appeared dark on the toilet tissue when she wiped. Patient denies any diaphoresis, palpitations, racing heart, fever, chills, chest pain. Patient notes some baseline shortness of breath which has not worsened recently, this is associated with some mild wheeze that is well treated by her home inhaler medications.  Patient presented to the emergency department for evaluation after speaking with triage nurse at the [facility]. In the ED she was  found to have hemoglobin of 6.3, but was otherwise hemodynamically stable. She received 2 units packed red blood cells.  Meds: Current Outpatient Prescriptions  Medication Sig Dispense Refill  . albuterol (PROAIR HFA) 108 (90 Base) MCG/ACT inhaler Inhale 1-2 puffs into the lungs every 6 (six) hours as needed for shortness of breath. 8.5 each 11  . ALPRAZolam (XANAX) 1 MG tablet Take 0.5-1 tablets (0.5-1 mg total) by mouth 2 (two) times daily as needed for anxiety. 65 tablet 5  . Blood Glucose Monitoring Suppl (ONETOUCH VERIO) W/DEVICE KIT 1 each by Does not apply route 4 (four) times daily. 1 kit 0  . chlorpheniramine (CHLOR-TRIMETON) 4 MG tablet Take 1 tablet (4 mg total) by mouth 3 (three) times daily as needed for allergies. 90 tablet 11  . cyclobenzaprine (FLEXERIL) 5 MG tablet Take 1 tablet (5 mg total) by mouth 3 (three) times daily as needed for muscle spasms. 90 tablet 3  . EASY TOUCH PEN NEEDLES 31G X 5 MM MISC USE TO INJECT INSULIN 5 TIMES DAILY 200 each 8  . ferrous gluconate (FERGON) 324 MG tablet Take 1 tablet (324 mg total) by mouth 2 (two) times daily with a meal. 180 tablet 3  . fluconazole (DIFLUCAN) 150 MG tablet Take 150 mg by mouth once.    Marland Kitchen FLUoxetine (PROZAC) 20 MG tablet Take 2 tablets (40 mg total) by mouth daily. 180 tablet 3  . fluticasone (FLONASE) 50 MCG/ACT nasal spray Place  2 sprays into both nostrils daily. 16 g 3  . Fluticasone-Salmeterol (ADVAIR DISKUS) 500-50 MCG/DOSE AEPB Inhale 1 puff into the lungs 2 (two) times daily. 180 each 3  . furosemide (LASIX) 80 MG tablet Take 1 tablet (80 mg total) by mouth 2 (two) times daily. 180 tablet 3  . gabapentin (NEURONTIN) 300 MG capsule Take 2 capsules (600 mg total) by mouth 3 (three) times daily. 540 capsule 3  . HYDROcodone-acetaminophen (NORCO/VICODIN) 5-325 MG tablet Take 1-2 tablets by mouth every 6 (six) hours as needed for severe pain. 150 tablet 0  . Insulin Detemir (LEVEMIR FLEXTOUCH) 100 UNIT/ML Pen Inject 20  Units into the skin daily at 10 pm. 15 mL 5  . Insulin Disposable Pump (V-GO 40) KIT FILL AND PLACE VGO40 ABOUT THE SAME TIME EACH DAY. USE 4 TO 5 CLICKS WITH MEALS AND 2 CLICKS WITH SNACKS 30 kit 5  . Lancets Misc. (ACCU-CHEK FASTCLIX LANCET) KIT Check your blood 4 times a day dx code 250.00 insulin requiring 1 kit 2  . lidocaine (XYLOCAINE) 2 % jelly APPLY TO AFFECTED AREA THREE TIMES A DAY 30 mL 10  . NOVOLOG 100 UNIT/ML injection USE TO FILL VGO40 TO WEAR DAILY. 30 mL 5  . nystatin cream (MYCOSTATIN) APPLY TOPICALLY TWO TIMES DAILY. 30 g 3  . omeprazole (PRILOSEC) 40 MG capsule Take 1 capsule (40 mg total) by mouth 2 (two) times daily. 180 capsule 3  . ONETOUCH VERIO test strip Check blood sugar up to 4 times a day 350 each 11  . potassium chloride SA (K-DUR,KLOR-CON) 20 MEQ tablet TAKE 1 TABLET BY MOUTH TWICE DAILY 180 tablet 2  . promethazine (PHENERGAN) 12.5 MG tablet Take 1 tablet (12.5 mg total) by mouth every 8 (eight) hours as needed for nausea or vomiting. 30 tablet 0  . rosuvastatin (CRESTOR) 20 MG tablet TAKE 1 TABLET BY MOUTH EVERY NIGHT AT BEDTIME 90 tablet 2  . SPIRIVA HANDIHALER 18 MCG inhalation capsule INHALE THE CONTENTS OF 1 CAPSULE VIA HANDIHALER BY MOUTH EVERY DAY 90 capsule 2  . warfarin (COUMADIN) 5 MG tablet Take 0.5 tablets (2.5 mg total) by mouth daily at 6 PM. 15 tablet 2   Allergies: Allergies as of 03/27/2016 - Review Complete 03/27/2016  Allergen Reaction Noted  . Morphine and related Other (See Comments) 06/27/2013  . Oxycontin [oxycodone] Other (See Comments) 05/03/2012  . Tramadol hcl    . Lorazepam Other (See Comments) 11/04/2010   Past Medical History:  Diagnosis Date  . Adenomatous polyps 05/14/2011   Colonoscopy (05/2011): 4 mm adenomatous polyp excised endoscopically Colonoscopy (02/2002): Adenomatous polyp excised endoscopically   . Allergic rhinitis 06/01/2012  . Anemia of chronic disease 01/01/2013  . Anxiety 07/24/2010  . Aortic atherosclerosis  ([place]) 10/19/2014   Seen on CT scan, currently asymptomatic  . 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 Asymptomatic cholelithiasis 09/25/2015   Seen on CT scan 08/2015  . Bilateral cataracts 08/04/2012   Visually insignificant   . Carotid artery stenosis    s/p right endarterectomy (06/2010) Carotid US (07/2010):  Left: Moderate-to-severe (60-79%) calcific and non-calcific plaque origin and proximal ICA and ECA   . Chronic congestive heart failure with left ventricular diastolic dysfunction ([place]) 10/21/2010  . Chronic constipation 02/03/2011  . Chronic daily headache 01/16/2014  . Chronic low back pain 10/06/2012  . Chronic venous insufficiency 08/04/2012  . Clear cell renal cell carcinoma ([place]) 07/21/2011   s/p  cryoablation of left RCC in 09/2011 by [name]. Followed by [name]  [facility]) .    Marland Kitchen COPD (chronic obstructive pulmonary disease) with emphysema (HCC)    PFTs 02/2012: FEV1 0.92 (40%), ratio 69, 27% increase in FEV1 with BD, TLC 91%, severe airtrapping, DLCO49% On chronic home O2. Pulmonary rehab referral 05/2012   . Depression 11/19/2005  . Esophageal stenosis 08/08/2015   Mild, benign-appearing on EGD 06/29/2015  . Fibromyalgia 08/29/2010  . Gastroesophageal reflux disease   . History of blood transfusion    "several times"   . Hyperlipidemia LDL goal < 100 11/20/2005  . Internal hemorrhoids 08/04/2012  . Mitral stenosis    s/p Mitral valve replacement with a 27-mm pericardial porcine valve (Medtronic Mosaic valve, serial #[id number] on 09/20/10, Dr. Prescott Gum)   . Moderate to severe pulmonary hypertension    2014 TEE w PA peak pressure 46 mmHg, s/p MV replacement   . Obesity (BMI 30.0-34.9) 10/23/2011  . Obstructive sleep apnea    Nocturnal polysomnography (06/2009): Moderate sleep apnea/ hypopnea syndrome , AHI 17.8 per hour with nonpositional hypopneas. CPAP titration to 12 CWP,  AHI 2.4 per hour. On nocturnal CPAP via a small resMed Quattro full-face mask with heated humidifier.   . Osteoporosis    DEXA (12/09/2011): L-spine T -3.7, left hip T -1.4 DEXA (12/2004): L-spine T -2.6, left hip -0.1   . Paroxysmal atrial fibrillation ([place]) 10/22/2010   s/p Left atrial maze procedure for paroxysmal atrial fibrillation on 09/20/2010 by Dr Prescott Gum.  Subsequent splenic infarct, decision was made to re-anticoagulate with coumadin, likely life-long as this is the most likely cause of the splenic infarct.   . Right nephrolithiasis 09/06/2014   5 mm non-obstructing calculus seen on CT scan 09/05/2014   . Seborrheic keratosis 09/28/2015  . Shortness of breath dyspnea   . Tobacco abuse 07/28/2012  . Type 2 diabetes mellitus with diabetic neuropathy (HCC)    Family History: Pt family history includes Healthy in her sister; Heart attack (age of onset: 12) in her brother; Heart attack (age of onset: 63) in her father; Lupus in her daughter; Obesity in her brother; Obsessive Compulsive Disorder in her daughter; Peptic Ulcer Disease in her father; Pneumonia in her mother.  Social History: Pt  reports that she has been smoking Cigarettes.  She started smoking about 53 years ago. She has a 53.00 pack-year smoking history. She has never used smokeless tobacco. She reports that she does not drink alcohol or use drugs.  Review of Systems: A complete ROS was negative except as per HPI. Review of Systems  Constitutional: Positive for malaise/fatigue. Negative for chills, fever and weight loss.  Eyes: Negative for blurred vision.  Respiratory: Positive for shortness of breath and wheezing. Negative for cough and sputum production.   Cardiovascular: Negative for chest pain, palpitations and leg swelling.  Gastrointestinal: Positive for abdominal pain, constipation, melena and nausea. Negative for blood in stool, diarrhea and vomiting.  Genitourinary: Positive for dysuria. Negative for frequency and  urgency.  Musculoskeletal: Negative for myalgias.  Skin: Negative for rash.  Neurological: Negative for dizziness, tremors and headaches.  Endo/Heme/Allergies: Negative for polydipsia.  Psychiatric/Behavioral: Positive for depression. The patient is not nervous/anxious.    Physical Exam: Vitals:   03/27/16 2045 03/27/16 2115 03/27/16 2205 03/27/16 2220  BP: (!) 145/49 132/74 144/68 (!) 158/54  Pulse: 89 87 88 88  Resp: _0 Temp:   98.5 F (36.9 C) 98.6 F (37 C)  TempSrc:   Oral Oral  SpO2: 98% 98% 100% 99%   Physical Exam  Constitutional: She is oriented to person, place, and time. She appears well-developed. She is cooperative. No distress.  HENT:  Head: Normocephalic and atraumatic.  Right Ear: Hearing normal.  Left Ear: Hearing normal.  Nose: Nose normal.  Mouth/Throat: Mucous membranes are normal.  Cardiovascular: Normal rate, regular rhythm, S1 normal, S2 normal and intact distal pulses.  Exam reveals no gallop.   No murmur heard. Pulmonary/Chest: Effort normal. No respiratory distress. She has wheezes. She has no rhonchi. She has no rales. She exhibits no tenderness. Breasts are symmetrical.  Barrel chested Mild increased WOB  Abdominal: Soft. Normal appearance and bowel sounds are normal. She exhibits no ascites. There is no hepatosplenomegaly. There is tenderness in the right lower quadrant and suprapubic area. There is no rigidity, no rebound, no guarding and no CVA tenderness.  Musculoskeletal: Normal range of motion.  Neurological: She is alert and oriented to person, place, and time. She has normal strength.  Skin: Skin is warm, dry and intact. She is not diaphoretic.  Psychiatric: She has a normal mood and affect. Her speech is normal and behavior is normal.   Labs: CBC:  Recent Labs Lab 03/27/16 1645  WBC 12.0*  HGB 6.3*  HCT 21.1*  MCV 83.1  PLT 626   Basic Metabolic Panel:  Recent Labs Lab 03/27/16 1645  NA 132*  K 4.2  CL 96*  CO2  27  GLUCOSE 263*  BUN 13  CREATININE 0.93  CALCIUM 8.5*   BNP (last 3 results)  Recent Labs  06/27/15 1139  BNP 383.9*   Coagulation Studies:  Recent Labs  03/27/16 1757  LABPROT 27.9*  INR 2.54   Liver Function Tests:  Recent Labs Lab 03/27/16 1645  AST 17  ALT 12*  ALKPHOS 41  BILITOT 0.3  PROT 6.2*  ALBUMIN 3.3*    Recent Labs Lab 03/27/16 1645  LIPASE 11   CBG: Lab Results  Component Value Date   HGBA1C 7.6 12/14/2015   Assessment & Plan by Problem: Principal Problem:   Acute blood loss anemia Active Problems:   Major depression, chronic   Anxiety   Gastroesophageal reflux disease with hiatal hernia   Type 2 diabetes mellitus with diabetic neuropathy (HCC)   Pulmonary emphysema (HCC)   Obstructive sleep apnea   Fibromyalgia   Chronic congestive heart failure with left ventricular diastolic dysfunction (HCC)   Paroxysmal atrial fibrillation (HCC)   Clear cell renal cell carcinoma (HCC)   Chronic low back pain   Anemia of chronic disease   Arteriovenous malformation of gastrointestinal tract  [name] is a 66 y.o. female on chronic anticoagulation for PAF and baseline AoCD who presents with acute onset of abd pain and dark stools found to be anemic to Hgb 6.3.  1) Anemia 2/2 GIB: abdominal pain with Hgb of 6.3 and 1 episode of dark stool on coumadin, INR 2.54. S/p 2U pRBC in ED. No signs of active bleeding w/o tachycardia or hypotension. Some SOB, but at baseline per pt (satting high 98-100 on 2L [place]). GIB thought to be slow and intermittent, no definitive source identified. H/o severe anemia in May 2017 where she was evaluated by GI with negative EGD and capsule study demonstrating mild gastritis, SB erosions, and non-bleeding AVM. H/o internal hemorroids on c-scope in 2013. She has a h/o non-compliance with her Fe supplementation therapy and recently d/c use when her abd pain worsened.  She is also convinced that her anemia is not  explained by GIB which contributes to her non-compliance.  - admit to tele - hold warfarin - PPI 49m IV BID - FOBT - LDH, haptoglobin, retic ct, ferritin, Fe, TIBC (pre-transfusion labs) - monitor H&H post-transfusion and trend CBC in AM - consider Feraheme, pending Fe studies - GI c/s, appreciate recs - maintain IV access x 2 - transfuse for Hgb less than 7.0  2) Transfusion Reaction: pt noted to have fevers to 100.3 during transfusion, no tachycardia, denies flank pain or oozing from IV site, no acute pain. Transfusion stopped. Treat with Tylenol + Benadryl. Restarted transfusion.  3) Abd pain: Lower quadrants, sub-acute for 1-2 months, intermittent. Not worsened or relieved by BM. Has had constipation recently. Stopped Fe supplements b/c she thought this was contributing. Pain has been constant today, but not more intense/severe. Had CT 08/23/15 for monitoring of RCC ablation therapy which demonstrated some enhancement which was thought likely 2/2 scar tissue. Repeat exam recommended in 6 months. - CT A/P - would favor gentle bowel regimen for constipation after GIB clears  4) t2DM: last Hgb A1c 7.6, 4 mos ago. Trial of VGO, but recently discontinued d/t issues with device placement sites. Now on Levemir 35U BID + 18U Novolog + SSI. - Levemir 20 qHS + 10U Novolog + SSI-r  5) PAF and MVR on AC: INR therapeutic at 2.54. No Vit K given. Will hold warfarin for now but plan to continue coumadin therapy with careful observation of HDS and Hgb after blood ct stabilizes..Marland Kitchen 6) HFpEF: Hold home 837mLasix BID. Pt notes that she has not been compliant with this recently and has been using PRN. She appears euvolemic on exam.  7) COPD / OSA: Continue home Advair and Spiriva. CPAP qHS.  8) Depression / Anxiety: has been feeling very low lately. Continue home meds: fluoxetine and alprazolam  9) Chronic Pain / Fibromyalgia: Continue home meds Norvo 5-325 1-2 tabs q6h PRN, Flexeril 74m69mID PRN.  10)  UTI / Recurrent vulvovaginal candidiasis: she has been treated with diflucan in the past, but feels this has not been effective. Today, she notes significant burning with urination today. UA shows leuks w/ too numerous too count WBC. - UCx - Rocephine empirically, narrow w/ UCx - will likely require diflucan  DVT PPx - Coumadin with INR goal 2-3  Code Status - Full  Consults Placed - GI  Dispo: Admit patient to Inpatient with expected length of stay greater than 2 midnights.  Signed: BryHolley RaringD 03/27/2016, 10:27 PM  Pager: [phone]

## 2016-03-27 NOTE — ED Triage Notes (Signed)
Pt reports left lower abdominal pain that has been ongoing. Pt reports constipation with dark stools. VSS with EMS

## 2016-03-27 NOTE — ED Notes (Signed)
Spoke with blood bank due to pt's temp only increasing one degree they defer treatment to Admit MD to start the full reaction workup. Pt is sitting on the bedside commode at this time appears in no distress IVF infusing at 167m/Hr

## 2016-03-27 NOTE — Telephone Encounter (Signed)
Pt is in route to [place]

## 2016-03-27 NOTE — ED Notes (Signed)
RT to bedside to evaluate pt., pt remains with breathing tx running pt appears to have improved symptoms

## 2016-03-27 NOTE — ED Provider Notes (Signed)
[facility] DEPT Provider Note   CSN: [id number] Arrival date & time: 03/27/16  0347     History   Chief Complaint Chief Complaint  Patient presents with  . Abdominal Pain    HPI [name] is a 66 y.o. female with a past medical history significant for COPD, pulmonary hypertension, CHF, atrial fibrillation on Coumadin therapy, diabetes, and prior GI bleed who presents with abdominal pain, fatigue, nausea, lightheadedness, and dark stools. Patient reports that she has had some abdominal pain for the last 2 weeks but had dark stools began today. She says that the pain was partially 7 out of 10 and diffuse across her abdomen. She reports that she has had no fevers, chills, chest pain, shortness of breath, vomiting, recent abdominal trauma, and says that she has had no problem with urination. She says that last year she had a decreased hemoglobin from a GI bleed and had to have transfusions. She says the dark stools were similar.    The history is provided by the patient and medical records. No language interpreter was used.  Rectal Bleeding  Quality:  Black and tarry Amount:  Moderate Duration:  1 day Timing:  Constant Chronicity:  Recurrent Similar prior episodes: yes   Relieved by:  Nothing Worsened by:  Nothing Ineffective treatments:  None tried Associated symptoms: abdominal pain and light-headedness   Associated symptoms: no dizziness, no fever and no vomiting   Risk factors: anticoagulant use     Past Medical History:  Diagnosis Date  . Adenomatous polyps 05/14/2011   Colonoscopy (05/2011): 4 mm adenomatous polyp excised endoscopically Colonoscopy (02/2002): Adenomatous polyp excised endoscopically   . Allergic rhinitis 06/01/2012  . Anemia of chronic disease 01/01/2013  . Anxiety 07/24/2010  . Aortic atherosclerosis ([place]) 10/19/2014   Seen on CT scan, currently asymptomatic  . Arteriovenous malformation of gastrointestinal tract 08/08/2015   Non-bleeding when  visualized on capsule endoscopy 06/30/2015   . Asymptomatic cholelithiasis 09/25/2015   Seen on CT scan 08/2015  . Bilateral cataracts 08/04/2012   Visually insignificant   . Carotid artery stenosis    s/p right endarterectomy (06/2010) Carotid US (07/2010):  Left: Moderate-to-severe (60-79%) calcific and non-calcific plaque origin and proximal ICA and ECA   . Chronic congestive heart failure with left ventricular diastolic dysfunction ([place]) 10/21/2010  . Chronic constipation 02/03/2011  . Chronic daily headache 01/16/2014  . Chronic low back pain 10/06/2012  . Chronic venous insufficiency 08/04/2012  . Clear cell renal cell carcinoma ([place]) 07/21/2011   s/p cryoablation of left RCC in 09/2011 by [name]. Followed by [name]  [facility]) .    Marland Kitchen COPD (chronic obstructive pulmonary disease) with emphysema (HCC)    PFTs 02/2012: FEV1 0.92 (40%), ratio 69, 27% increase in FEV1 with BD, TLC 91%, severe airtrapping, DLCO49% On chronic home O2. Pulmonary rehab referral 05/2012   . Depression 11/19/2005  . Esophageal stenosis 08/08/2015   Mild, benign-appearing on EGD 06/29/2015  . Fibromyalgia 08/29/2010  . Gastroesophageal reflux disease   . History of blood transfusion    "several times"   . Hyperlipidemia LDL goal < 100 11/20/2005  . Internal hemorrhoids 08/04/2012  . Mitral stenosis    s/p Mitral valve replacement with a 27-mm pericardial porcine valve (Medtronic Mosaic valve, serial #[id number] on 09/20/10, Dr. Prescott Gum)   . Moderate to severe pulmonary hypertension    2014 TEE w PA peak pressure 46 mmHg, s/p MV replacement   . Obesity (BMI 30.0-34.9) 10/23/2011  .  Obstructive sleep apnea    Nocturnal polysomnography (06/2009): Moderate sleep apnea/ hypopnea syndrome , AHI 17.8 per hour with nonpositional hypopneas. CPAP titration to 12 CWP, AHI 2.4 per hour. On nocturnal CPAP via a small resMed Quattro full-face mask with heated humidifier.   . Osteoporosis    DEXA (12/09/2011):  L-spine T -3.7, left hip T -1.4 DEXA (12/2004): L-spine T -2.6, left hip -0.1   . Paroxysmal atrial fibrillation ([place]) 10/22/2010   s/p Left atrial maze procedure for paroxysmal atrial fibrillation on 09/20/2010 by Dr Prescott Gum.  Subsequent splenic infarct, decision was made to re-anticoagulate with coumadin, likely life-long as this is the most likely cause of the splenic infarct.   . Right nephrolithiasis 09/06/2014   5 mm non-obstructing calculus seen on CT scan 09/05/2014   . Seborrheic keratosis 09/28/2015  . Shortness of breath dyspnea   . Tobacco abuse 07/28/2012  . Type 2 diabetes mellitus with diabetic neuropathy [facility])     Patient Active Problem List   Diagnosis Date Noted  . Fatigue 02/18/2016  . Unexplained night sweats 12/14/2015  . Seborrheic keratosis 09/28/2015  . Asymptomatic cholelithiasis 09/25/2015  . Esophageal stenosis 08/08/2015  . Arteriovenous malformation of gastrointestinal tract 08/08/2015  . Aortic atherosclerosis ([place]) 10/19/2014  . Right nephrolithiasis 09/06/2014  . Vulvovaginal candidiasis 09/20/2013  . Chronic daily headache 01/14/2013  . Anemia of chronic disease 01/01/2013  . Chronic low back pain 10/06/2012  . Long term current use of anticoagulant therapy 09/30/2012  . Bilateral cataracts 08/04/2012  . Internal and external hemorrhoids without complication 17/91/5056  . Chronic venous insufficiency 08/04/2012  . Tobacco abuse 07/28/2012  . Allergic rhinitis 06/01/2012  . Obesity (BMI 30.0-34.9) 10/23/2011  . Clear cell renal cell carcinoma ([place]) 07/21/2011  . Adenomatous polyps 05/14/2011  . Health care maintenance 11/04/2010  . Paroxysmal atrial fibrillation ([place]) 10/22/2010  . Chronic congestive heart failure with left ventricular diastolic dysfunction ([place]) 10/21/2010  . Fibromyalgia 08/29/2010  . Gastroesophageal reflux disease with hiatal hernia   . Type 2 diabetes mellitus with diabetic neuropathy ([place])   . Pulmonary emphysema ([place])   .  Moderate to severe pulmonary hypertension   . Osteoporosis   . Obstructive sleep apnea   . Mitral stenosis   . Stenosis of right carotid artery   . Anxiety 07/24/2010  . Hyperlipidemia 11/20/2005  . Major depression, chronic 11/19/2005    Past Surgical History:  Procedure Laterality Date  . CARDIAC VALVE REPLACEMENT  Aug. 2012   "mitral valve"  . CAROTID ENDARTERECTOMY Right 07/04/2010   by [name] for asymptomatic right carotid artery stenosis  . CHEST TUBE INSERTION  09/24/2010   Dr Prescott Gum  . COLONOSCOPY  05/12/2011   performed by Dr. Michail Sermon. Showing small internal hemorrhoids, single tubular adenoma polyp  . CRYOABLATION Left 09/2011   by [name]. Followed by [name]  [facility]) .    Marland Kitchen DILATION AND CURETTAGE OF UTERUS    . ESOPHAGOGASTRODUODENOSCOPY  05/12/2011   performed by Dr. Michail Sermon. Negative for ulcerations, biopsy negative for evidence of celiac sprue  . ESOPHAGOGASTRODUODENOSCOPY N/A 06/29/2015   Procedure: ESOPHAGOGASTRODUODENOSCOPY (EGD);  Surgeon: Clarene Essex, MD;  Location: [facility] ENDOSCOPY;  Service: Endoscopy;  Laterality: N/A;  . FRACTURE SURGERY Right 2005   clavicle  . GIVENS CAPSULE STUDY N/A 06/30/2015   Procedure: GIVENS CAPSULE STUDY;  Surgeon: Clarene Essex, MD;  Location: [facility] ENDOSCOPY;  Service: Endoscopy;  Laterality: N/A;  . GIVENS CAPSULE STUDY N/A 06/29/2015   Procedure: GIVENS  CAPSULE STUDY;  Surgeon: Clarene Essex, MD;  Location: [facility] ENDOSCOPY;  Service: Endoscopy;  Laterality: N/A;  . Hysteroscopy with endometrial ablation  06/2001   for persistent post-menopausal bleeding // by S. [name], M.D.  . IR GENERIC HISTORICAL  08/23/2015   IR RADIOLOGIST EVAL & MGMT 08/23/2015 Aletta Edouard, MD [facility] INTERV RAD  . LIPOMA EXCISION  08/2005   occipital lipoma 1.5cm - by Dr. [name]  . MAZE Left 09/20/10   for paroxysmal atrial fibrillation (Dr. Prescott Gum)  . MITRAL VALVE REPLACEMENT  09/20/10    with a 27-mm pericardial porcine valve  (Medtronic Mosaic valve, serial #[id number]). 09/20/10, Dr Prescott Gum  . ORIF CLAVICLE FRACTURE  01/2004   by Thana Farr. [name], M.D for Right clavicle nonunion.  . TONSILLECTOMY    . TUBAL LIGATION      OB History    No data available       Home Medications    Prior to Admission medications   Medication Sig Start Date End Date Taking? Authorizing Provider  albuterol (PROAIR HFA) 108 (90 Base) MCG/ACT inhaler Inhale 1-2 puffs into the lungs every 6 (six) hours as needed for shortness of breath. 11/14/15   [name], MD  ALPRAZolam [name]) 1 MG tablet Take 0.5-1 tablets (0.5-1 mg total) by mouth 2 (two) times daily as needed for anxiety. 02/13/16   [name], MD  Blood Glucose Monitoring Suppl (ONETOUCH VERIO) W/DEVICE KIT 1 each by Does not apply route 4 (four) times daily. 07/25/14   [name], MD  chlorpheniramine (CHLOR-TRIMETON) 4 MG tablet Take 1 tablet (4 mg total) by mouth 3 (three) times daily as needed for allergies. 10/06/14   [name], MD  cyclobenzaprine (FLEXERIL) 5 MG tablet Take 1 tablet (5 mg total) by mouth 3 (three) times daily as needed for muscle spasms. 01/11/16   [name], MD  EASY TOUCH PEN NEEDLES 31G X 5 MM MISC USE TO INJECT INSULIN 5 TIMES DAILY 07/26/15   [name], MD  ferrous gluconate (FERGON) 324 MG tablet Take 1 tablet (324 mg total) by mouth 2 (two) times daily with a meal. 11/16/15   [name], MD  fluconazole (DIFLUCAN) 150 MG tablet Take 150 mg by mouth once. 01/01/16   Historical Provider, MD  FLUoxetine (PROZAC) 20 MG tablet Take 2 tablets (40 mg total) by mouth daily. 12/14/15   [name], MD  fluticasone (FLONASE) 50 MCG/ACT nasal spray Place 2 sprays into both nostrils daily. 02/08/15   [name], MD  Fluticasone-Salmeterol (ADVAIR DISKUS) 500-50 MCG/DOSE AEPB Inhale 1 puff into the lungs 2 (two) times daily. 12/14/15   [name], MD  furosemide (LASIX) 80 MG tablet Take 1 tablet (80 mg total) by mouth 2 (two)  times daily. 06/28/15   [name], MD  gabapentin (NEURONTIN) 300 MG capsule Take 2 capsules (600 mg total) by mouth 3 (three) times daily. 11/16/15   [name], MD  HYDROcodone-acetaminophen (NORCO/VICODIN) 5-325 MG tablet Take 1-2 tablets by mouth every 6 (six) hours as needed for severe pain. 11/16/15   [name], MD  Insulin Detemir (LEVEMIR FLEXTOUCH) 100 UNIT/ML Pen Inject 20 Units into the skin daily at 10 pm. 02/13/16   [name], MD  Insulin Disposable Pump (V-GO 40) KIT FILL AND PLACE VGO40 ABOUT THE SAME TIME EACH DAY. USE 4 TO 5 CLICKS WITH MEALS AND 2 CLICKS WITH SNACKS 7/35/32   [name], MD  Lancets Misc. (ACCU-CHEK FASTCLIX LANCET) KIT Check your blood 4 times  a day dx code 250.00 insulin requiring 01/19/13   [name], MD  lidocaine (XYLOCAINE) 2 % jelly APPLY TO AFFECTED AREA THREE TIMES A DAY 09/19/15   Sid Falcon, MD  NOVOLOG 100 UNIT/ML injection USE TO FILL VGO40 TO WEAR DAILY. 03/04/16   [name], MD  nystatin cream (MYCOSTATIN) APPLY TOPICALLY TWO TIMES DAILY. 01/11/16   [name], MD  omeprazole (PRILOSEC) 40 MG capsule Take 1 capsule (40 mg total) by mouth 2 (two) times daily. 08/22/15   [name], MD  [facility] VERIO test strip Check blood sugar up to 4 times a day 02/28/15   [name], MD  potassium chloride SA (K-DUR,KLOR-CON) 20 MEQ tablet TAKE 1 TABLET BY MOUTH TWICE DAILY 09/19/15   Sid Falcon, MD  promethazine (PHENERGAN) 12.5 MG tablet Take 1 tablet (12.5 mg total) by mouth every 8 (eight) hours as needed for nausea or vomiting. 07/13/15   [name], DO  rosuvastatin (CRESTOR) 20 MG tablet TAKE 1 TABLET BY MOUTH EVERY NIGHT AT BEDTIME 09/19/15   Sid Falcon, MD  SPIRIVA HANDIHALER 18 MCG inhalation capsule INHALE THE CONTENTS OF 1 CAPSULE VIA HANDIHALER BY MOUTH EVERY DAY 09/19/15   Sid Falcon, MD  warfarin (COUMADIN) 5 MG tablet Take 0.5 tablets (2.5 mg total) by mouth daily at 6 PM. 03/04/16   [name], MD     Family History Family History  Problem Relation Age of Onset  . Peptic Ulcer Disease Father   . Heart attack Father 54    Died of MI at age 54  . Heart attack Brother 11    Died of MI at age 52  . Obesity Brother   . Pneumonia Mother   . Healthy Sister   . Lupus Daughter   . Obsessive Compulsive Disorder Daughter     Social History Social History  Substance Use Topics  . Smoking status: Current Every Day Smoker    Packs/day: 1.00    Years: 49.00    Types: Cigarettes    Start date: 10/31/2013  . Smokeless tobacco: Never Used     Comment: 1/2 to 1 pack per day  . Alcohol use No     Allergies   Morphine and related; Oxycontin [oxycodone]; Tramadol hcl; and Lorazepam   Review of Systems Review of Systems  Constitutional: Negative for activity change, chills, diaphoresis, fatigue and fever.  HENT: Negative for congestion and rhinorrhea.   Eyes: Negative for visual disturbance.  Respiratory: Negative for cough, chest tightness, shortness of breath and stridor.   Cardiovascular: Negative for chest pain, palpitations and leg swelling.  Gastrointestinal: Positive for abdominal pain, blood in stool and hematochezia. Negative for abdominal distention, constipation, diarrhea, nausea and vomiting.  Genitourinary: Negative for difficulty urinating, dysuria, flank pain, frequency, hematuria, menstrual problem, pelvic pain, vaginal bleeding and vaginal discharge.  Musculoskeletal: Negative for back pain and neck pain.  Skin: Negative for rash and wound.  Neurological: Positive for light-headedness. Negative for dizziness, weakness, numbness and headaches.  Psychiatric/Behavioral: Negative for agitation and confusion.  All other systems reviewed and are negative.    Physical Exam Updated Vital Signs BP 145/56 (BP Location: Right Arm)   Pulse 88   Temp 99.5 F (37.5 C) (Oral)   SpO2 99%   Physical Exam  Constitutional: She is oriented to person, place, and time. She  appears well-developed and well-nourished. No distress.  HENT:  Head: Normocephalic and atraumatic.  Mouth/Throat: Oropharynx is clear and moist. No oropharyngeal exudate.  Eyes: Conjunctivae are normal. Pupils are equal, round, and reactive to light.  Neck: Normal range of motion. Neck supple.  Cardiovascular: Normal rate, regular rhythm, normal heart sounds and intact distal pulses.   No murmur heard. Pulmonary/Chest: Effort normal and breath sounds normal. No respiratory distress. She has no wheezes. She exhibits no tenderness.  Abdominal: Soft. There is no tenderness.  Musculoskeletal: She exhibits no edema or tenderness.  Neurological: She is alert and oriented to person, place, and time. No sensory deficit. She exhibits normal muscle tone.  Skin: Skin is warm and dry. Capillary refill takes less than 2 seconds. She is not diaphoretic. There is pallor.  Psychiatric: She has a normal mood and affect.  Nursing note and vitals reviewed.    ED Treatments / Results  Labs (all labs ordered are listed, but only abnormal results are displayed) Labs Reviewed  COMPREHENSIVE METABOLIC PANEL - Abnormal; Notable for the following:       Result Value   Sodium 132 (*)    Chloride 96 (*)    Glucose, Bld 263 (*)    Calcium 8.5 (*)    Total Protein 6.2 (*)    Albumin 3.3 (*)    ALT 12 (*)    All other components within normal limits  CBC - Abnormal; Notable for the following:    WBC 12.0 (*)    RBC 2.54 (*)    Hemoglobin 6.3 (*)    HCT 21.1 (*)    MCH 24.8 (*)    MCHC 29.9 (*)    RDW 18.3 (*)    All other components within normal limits  URINALYSIS, ROUTINE W REFLEX MICROSCOPIC - Abnormal; Notable for the following:    APPearance CLOUDY (*)    Glucose, UA >=500 (*)    Hgb urine dipstick SMALL (*)    Leukocytes, UA LARGE (*)    Bacteria, UA RARE (*)    Squamous Epithelial / LPF 6-30 (*)    All other components within normal limits  PROTIME-INR - Abnormal; Notable for the following:     Prothrombin Time 27.9 (*)    All other components within normal limits  IRON AND TIBC - Abnormal; Notable for the following:    Iron 22 (*)    Saturation Ratios 5 (*)    All other components within normal limits  FERRITIN - Abnormal; Notable for the following:    Ferritin 5 (*)    All other components within normal limits  RETICULOCYTES - Abnormal; Notable for the following:    Retic Ct Pct 4.0 (*)    RBC. 2.70 (*)    All other components within normal limits  I-STAT CG4 LACTIC ACID, ED - Abnormal; Notable for the following:    Lactic Acid, Venous 2.04 (*)    All other components within normal limits  URINE CULTURE  LIPASE, BLOOD  LACTATE DEHYDROGENASE  HAPTOGLOBIN  CBC  BASIC METABOLIC PANEL  OCCULT BLOOD X 1 CARD TO LAB, STOOL  LACTIC ACID, PLASMA  I-STAT CG4 LACTIC ACID, ED  TYPE AND SCREEN  PREPARE RBC (CROSSMATCH)    EKG  EKG Interpretation  Date/Time:  Thursday March 27 2016 16:43:07 EST Ventricular Rate:  86 PR Interval:    QRS Duration: 97 QT Interval:  410 QTC Calculation: 491 R Axis:   -68 Text Interpretation:  Sinus rhythm Prolonged PR interval Left axis deviation Borderline prolonged QT interval When compared to prior, no significant changes were seen.  No STEMI Confirmed by [facility] MD, Miqueas Whilden [phone])  on 03/28/2016 1:07:50 AM       Radiology No results found.  Procedures Procedures (including critical care time)  CRITICAL CARE Performed by: Gwenyth Allegra Kaidance Pantoja Total critical care time: 35 minutes Critical care time was exclusive of separately billable procedures and treating other patients. Critical care was necessary to treat or prevent imminent or life-threatening deterioration. Critical care was time spent personally by me on the following activities: development of treatment plan with patient and/or surrogate as well as nursing, discussions with consultants, evaluation of patient's response to treatment, examination of patient,  obtaining history from patient or surrogate, ordering and performing treatments and interventions, ordering and review of laboratory studies, ordering and review of radiographic studies, pulse oximetry and re-evaluation of patient's condition.   Medications Ordered in ED Medications  nicotine (NICODERM CQ - dosed in mg/24 hr) patch 7 mg (7 mg Transdermal Patch Applied 03/27/16 1941)  albuterol (PROVENTIL) (2.5 MG/3ML) 0.083% nebulizer solution 2.5 mg (2.5 mg Inhalation Given 03/27/16 2303)  ALPRAZolam (XANAX) tablet 0.5-1 mg (not administered)  FLUoxetine (PROZAC) tablet 40 mg (not administered)  gabapentin (NEURONTIN) capsule 600 mg (600 mg Oral Given 03/27/16 2213)  insulin detemir (LEVEMIR) injection 20 Units (20 Units Subcutaneous Given 03/27/16 2215)  nystatin cream (MYCOSTATIN) ( Topical Given 03/27/16 2214)  tiotropium (SPIRIVA) inhalation capsule 18 mcg (not administered)  insulin aspart (novoLOG) injection 3 Units (not administered)  insulin aspart (novoLOG) injection 0-15 Units (not administered)  pantoprazole (PROTONIX) injection 40 mg (not administered)  cefTRIAXone (ROCEPHIN) 1 g in dextrose 5 % 50 mL IVPB (not administered)  iopamidol (ISOVUE-300) 61 % injection (not administered)  pantoprazole (PROTONIX) injection 40 mg (40 mg Intravenous Given 03/27/16 1801)  ondansetron (ZOFRAN) injection 4 mg (4 mg Intravenous Given 03/27/16 1801)  0.9 %  sodium chloride infusion ( Intravenous Stopped 03/27/16 1930)  fentaNYL (SUBLIMAZE) injection 50 mcg (50 mcg Intravenous Given 03/27/16 2009)  diphenhydrAMINE (BENADRYL) capsule 25 mg (25 mg Oral Given 03/27/16 2213)  acetaminophen (TYLENOL) tablet 650 mg (650 mg Oral Given 03/27/16 2213)  ipratropium-albuterol (DUONEB) 0.5-2.5 (3) MG/3ML nebulizer solution (3 mLs  Given 03/27/16 2317)  iopamidol (ISOVUE-M) 61 % intrathecal injection 15 mL (100 mLs Intrathecal Contrast Given 03/28/16 0037)     Initial Impression / Assessment and Plan / ED Course   I have reviewed the triage vital signs and the nursing notes.  Pertinent labs & imaging results that were available during my care of the patient were reviewed by me and considered in my medical decision making (see chart for details).     [name] is a 66 y.o. female with a past medical history significant for COPD, pulmonary hypertension, CHF, atrial fibrillation on Coumadin therapy, diabetes, and prior GI bleed who presents with abdominal pain, fatigue, nausea, lightheadedness, and dark stools.  History and exam are seen above. Para 5 exam, patient had no significant tenderness on her abdomen. Patient's back was nontender. Patient's lungs were clear.  Based on patient's history of GI bleed, dark tarry stools, abdominal pain, and similar fatigue and lightheadedness to prior, clinical concern for GI bleed. Patient had screening laboratory testing performed showing a hemoglobin of 6.3. This appears decreased from her last value in the nines. Patient's other laboratory testing showed mild leukocytosis of 12.0, and slightly elevated lactic acid.  Patient quickly given Protonix, fluids were ordered, patient had blood ordered 2 units, and GI was called. GI will see the patient in the morning. GI recommended admission for further management of her GI  bleed.  Internal medicine team was called and they will admit the patient for further management of her GI bleed. Patient developed slight increase in temperature after fluids blood was begun. Admitting team requested to be reduced in rate. This was done. Patient had improvement in her symptoms and denies any chest pain.   Patient admitted for further management of her GI bleed.   Final Clinical Impressions(s) / ED Diagnoses   Final diagnoses:  Abdominal pain  Gastrointestinal hemorrhage, unspecified gastrointestinal hemorrhage type  Anemia, unspecified type    New Prescriptions Current Discharge Medication List      Clinical  Impression: 1. Gastrointestinal hemorrhage, unspecified gastrointestinal hemorrhage type   2. Abdominal pain   3. Anemia, unspecified type     Disposition: Admit to Medicine service.    Gwenyth Allegra Cathey [place], MD 03/28/16 0111

## 2016-03-27 NOTE — ED Notes (Addendum)
Unable to get UA

## 2016-03-27 NOTE — Patient Outreach (Signed)
   RNCM received telephone call from patient who advised this RNCM she had dark stool this morning, some nausea without vomitting and wasnow having  servere abdominal pain.  En route, this RNCM made call to Dr. Caroline More office to notify him of patient's complaint. Spoke with Triage Nurse, Manuela Schwartz, who was able to schedule appointment for tomorrow at 345 pm. Upon arrival at patient's home, [facility] was advised by patient she think she needed to go to [facility] ED. RNCM made 911 call as patient states she was short of breath. RNCM placed oxygen on patient via nasal cannula at 2.liter/minute  RNCM stayed with patient until emergency personnel arrived, provided them with vital signs RNCM made telephone call to Triage Nurse at Dr. Caroline More office to advise them of above intervention

## 2016-03-27 NOTE — ED Notes (Signed)
Went in to evaluate pt with VS for blood admin.  Pt noted to have temp of 100.3 appears in no distress all other VS [facility] ER MD made aware paged out admit team that called back Blood product slowed infusion rate to 19m/hr pt remains on the monitor in no distress will continue to monitor.  Admit team to come down and evaluate pt. Within the next 30 minutes

## 2016-03-28 ENCOUNTER — Ambulatory Visit: Payer: PPO

## 2016-03-28 ENCOUNTER — Inpatient Hospital Stay (HOSPITAL_COMMUNITY): Payer: PPO

## 2016-03-28 ENCOUNTER — Other Ambulatory Visit: Payer: Self-pay

## 2016-03-28 ENCOUNTER — Telehealth: Payer: Self-pay

## 2016-03-28 DIAGNOSIS — Z7901 Long term (current) use of anticoagulants: Secondary | ICD-10-CM

## 2016-03-28 DIAGNOSIS — B373 Candidiasis of vulva and vagina: Secondary | ICD-10-CM

## 2016-03-28 DIAGNOSIS — Z952 Presence of prosthetic heart valve: Secondary | ICD-10-CM

## 2016-03-28 DIAGNOSIS — Z7951 Long term (current) use of inhaled steroids: Secondary | ICD-10-CM

## 2016-03-28 DIAGNOSIS — R1032 Left lower quadrant pain: Secondary | ICD-10-CM

## 2016-03-28 DIAGNOSIS — K5900 Constipation, unspecified: Secondary | ICD-10-CM

## 2016-03-28 DIAGNOSIS — R5084 Febrile nonhemolytic transfusion reaction: Secondary | ICD-10-CM

## 2016-03-28 DIAGNOSIS — Z9981 Dependence on supplemental oxygen: Secondary | ICD-10-CM

## 2016-03-28 DIAGNOSIS — D500 Iron deficiency anemia secondary to blood loss (chronic): Secondary | ICD-10-CM

## 2016-03-28 DIAGNOSIS — E119 Type 2 diabetes mellitus without complications: Secondary | ICD-10-CM

## 2016-03-28 DIAGNOSIS — Z9114 Patient's other noncompliance with medication regimen: Secondary | ICD-10-CM

## 2016-03-28 DIAGNOSIS — R1031 Right lower quadrant pain: Secondary | ICD-10-CM

## 2016-03-28 LAB — GLUCOSE, CAPILLARY
Glucose-Capillary: 209 mg/dL — ABNORMAL HIGH (ref 65–99)
Glucose-Capillary: 219 mg/dL — ABNORMAL HIGH (ref 65–99)
Glucose-Capillary: 214 mg/dL — ABNORMAL HIGH (ref 65–99)
Glucose-Capillary: 134 mg/dL — ABNORMAL HIGH (ref 65–99)
Glucose-Capillary: 202 mg/dL — ABNORMAL HIGH (ref 65–99)

## 2016-03-28 LAB — HEMOGLOBIN AND HEMATOCRIT, BLOOD
HCT: 27.3 % — ABNORMAL LOW (ref 36.0–46.0)
Hemoglobin: 8.3 g/dL — ABNORMAL LOW (ref 12.0–15.0)

## 2016-03-28 LAB — BASIC METABOLIC PANEL
Anion gap: 8 (ref 5–15)
BUN: 9 mg/dL (ref 6–20)
CO2: 26 mmol/L (ref 22–32)
Calcium: 8.5 mg/dL — ABNORMAL LOW (ref 8.9–10.3)
Chloride: 105 mmol/L (ref 101–111)
Creatinine, Ser: 0.94 mg/dL (ref 0.44–1.00)
GFR calc Af Amer: >60 mL/min (ref >60)
GFR calc non Af Amer: >60 mL/min (ref >60)
Glucose, Bld: 200 mg/dL — ABNORMAL HIGH (ref 65–99)
Potassium: 4.4 mmol/L (ref 3.5–5.1)
Sodium: 139 mmol/L (ref 135–145)

## 2016-03-28 LAB — CBC
HCT: 24.6 % — ABNORMAL LOW (ref 36.0–46.0)
HCT: 23.8 % — ABNORMAL LOW (ref 36.0–46.0)
Hemoglobin: 7.4 g/dL — ABNORMAL LOW (ref 12.0–15.0)
Hemoglobin: 7.3 g/dL — ABNORMAL LOW (ref 12.0–15.0)
MCH: 25.3 pg — ABNORMAL LOW (ref 26.0–34.0)
MCH: 26 pg (ref 26.0–34.0)
MCHC: 30.1 g/dL (ref 30.0–36.0)
MCHC: 30.7 g/dL (ref 30.0–36.0)
MCV: 84 fL (ref 78.0–100.0)
MCV: 84.7 fL (ref 78.0–100.0)
Platelets: 184 10*3/uL (ref 150–400)
Platelets: 175 10*3/uL (ref 150–400)
RBC: 2.93 MIL/uL — ABNORMAL LOW (ref 3.87–5.11)
RBC: 2.81 MIL/uL — ABNORMAL LOW (ref 3.87–5.11)
RDW: 17.3 % — ABNORMAL HIGH (ref 11.5–15.5)
RDW: 17.6 % — ABNORMAL HIGH (ref 11.5–15.5)
WBC: 9.2 10*3/uL (ref 4.0–10.5)
WBC: 8.4 10*3/uL (ref 4.0–10.5)

## 2016-03-28 LAB — LACTIC ACID, PLASMA: Lactic Acid, Venous: 1.1 mmol/L (ref 0.5–1.9)

## 2016-03-28 LAB — PREPARE RBC (CROSSMATCH)

## 2016-03-28 MED ORDER — ACETAMINOPHEN 325 MG PO TABS
650.0000 mg | ORAL_TABLET | Freq: Once | ORAL | Status: AC
  Administered 2016-03-28: 650 mg via ORAL
  Filled 2016-03-28: qty 2

## 2016-03-28 MED ORDER — SODIUM CHLORIDE 0.9 % IV SOLN: Freq: Once | INTRAVENOUS | Status: DC

## 2016-03-28 MED ORDER — DIPHENHYDRAMINE HCL 50 MG/ML IJ SOLN
25.0000 mg | Freq: Once | INTRAMUSCULAR | Status: AC
  Administered 2016-03-28: 25 mg via INTRAVENOUS
  Filled 2016-03-28: qty 1

## 2016-03-28 MED ORDER — ACETAMINOPHEN 325 MG PO TABS: 650.0000 mg | ORAL_TABLET | Freq: Once | ORAL | Status: DC

## 2016-03-28 MED ORDER — HYDROCODONE-ACETAMINOPHEN 5-325 MG PO TABS
1.0000 | ORAL_TABLET | Freq: Four times a day (QID) | ORAL | Status: DC | PRN
  Administered 2016-03-28 – 2016-03-29 (×5): 2 via ORAL
  Filled 2016-03-28 (×5): qty 2

## 2016-03-28 MED ORDER — IOPAMIDOL (ISOVUE-M 300) INJECTION 61%
15.0000 mL | Freq: Once | INTRAMUSCULAR | Status: AC | PRN
  Administered 2016-03-28: 100 mL via INTRATHECAL

## 2016-03-28 MED ORDER — SODIUM CHLORIDE 0.9 % IV SOLN
Freq: Once | INTRAVENOUS | Status: AC
  Administered 2016-03-28: 16:00:00 via INTRAVENOUS

## 2016-03-28 MED ORDER — DIPHENHYDRAMINE HCL 50 MG/ML IJ SOLN: 25.0000 mg | Freq: Once | INTRAMUSCULAR | Status: DC

## 2016-03-28 MED ORDER — ACETAMINOPHEN 500 MG PO TABS: 1000.0000 mg | ORAL_TABLET | Freq: Four times a day (QID) | ORAL | Status: DC | PRN

## 2016-03-28 NOTE — Progress Notes (Signed)
Pt arrived on unit in no acute distress; transfer from stretcher to non monitored bed.  Completed blood transfusion observed.  Vitals signs obtained as following:  T. 98.4, BP. 143/52, R. 18, P. 85, and 100% on nasal cannula.  Respiratory therapist paged and C-Pap applied.  In bed resting.  Will continue to monitor.

## 2016-03-28 NOTE — Progress Notes (Signed)
Subjective: Patient was complaining of left-sided lower abdominal pain this morning. Denies any nausea or vomiting. Denies any diarrhea. Denies any ongoing hematochezia or melena.  Objective:  Vital signs in last 24 hours: Vitals:   03/28/16 0059 03/28/16 0120 03/28/16 0154 03/28/16 0535  BP: (!) 141/42  (!) 143/52 (!) 131/37  Pulse: 85 76 78 77  Resp: _0 Temp: 98.4 F (36.9 C)   98.4 F (36.9 C)  TempSrc: Oral   Oral  SpO2: 99% 97% (!) 79% 100%  Weight:    195 lb 9.6 oz (88.7 kg)  Height:    _1  (1.575 m)   Gen. Well-developed, well-nourished, morbidly obese lady, in no acute distress. Chest. Clear bilaterally. CVS. Normal rate and rhythm. Abdomen. Soft, obese, mild tenderness along the left mid and lower quadrant, bowel sounds positive. Extremities. No edema, no cyanosis, pulses 2+ bilaterally.  Labs. CBC Latest Ref Rng & Units 03/28/2016 03/28/2016 03/27/2016  WBC 4.0 - 10.5 K/uL 8.4 9.2 12.0(H)  Hemoglobin 12.0 - 15.0 g/dL 7.3(L) 7.4(L) 6.3(LL)  Hematocrit 36.0 - 46.0 % 23.8(L) 24.6(L) 21.1(L)  Platelets 150 - 400 K/uL 175 184 193   BMP Latest Ref Rng & Units 03/28/2016 03/27/2016 08/23/2015  Glucose 65 - 99 mg/dL 200(H) 263(H) -  BUN 6 - 20 mg/dL 9 13 -  Creatinine 0.44 - 1.00 mg/dL 0.94 0.93 0.80  BUN/Creat Ratio 12 - 28 - - -  Sodium 135 - 145 mmol/L 139 132(L) -  Potassium 3.5 - 5.1 mmol/L 4.4 4.2 -  Chloride 101 - 111 mmol/L 105 96(L) -  CO2 22 - 32 mmol/L 26 27 -  Calcium 8.9 - 10.3 mg/dL 8.5(L) 8.5(L) -   Iron/TIBC/Ferritin/ %Sat    Component Value Date/Time   IRON 22 (L) 03/27/2016 2126   TIBC 445 03/27/2016 2126   FERRITIN 5 (L) 03/27/2016 2126   FERRITIN 29 08/08/2015 1441   IRONPCTSAT 5 (L) 03/27/2016 2126   IRONPCTSAT 10 (L) 06/13/2013 0847   CT abdomen and pelvis. FINDINGS: Lower chest: A bleb is noted at the right lung base. Scattered calcification is noted at the aortic and mitral valves, with postoperative change at the mitral valve.  Scattered coronary artery calcifications are seen.  Hepatobiliary: The liver is unremarkable in appearance. The gallbladder is unremarkable in appearance. The common bile duct remains normal in caliber. Trace fluid is seen about the liver.  Pancreas: The pancreas is within normal limits.  Spleen: The spleen is unremarkable in appearance.  Adrenals/Urinary Tract: The adrenal glands are unremarkable in appearance.  Post ablation changes at the posterior aspect of the lower pole of the left kidney are grossly unchanged from 2017. There is no evidence of recurrent mass, with a mixture fatty attenuation and soft tissue attenuation. Mild delayed enhancement at the small 1.2 cm soft tissue component along the posterior aspect of the ablation area is stable in appearance.  A 6 mm nonobstructing stone is noted at the interpole region of the right kidney. Mild nonspecific perinephric stranding is noted bilaterally. There is no evidence of hydronephrosis. No obstructing ureteral stones are identified. No perinephric stranding is seen.  Stomach/Bowel: Visualized small and large bowel loops are grossly unremarkable in appearance. The stomach is largely decompressed and unremarkable.  Vascular/Lymphatic: Diffuse calcification is seen along the abdominal aorta and its branches. The abdominal aorta is otherwise grossly unremarkable. The inferior vena cava is grossly unremarkable. No retroperitoneal lymphadenopathy is seen.  Other: There is question of mild  soft tissue inflammation at the right lower quadrant. Would correlate clinically for any evidence of appendicitis, as the appendix is not imaged on this study.  Musculoskeletal: No acute osseous abnormalities are identified. The visualized musculature is unremarkable in appearance.  IMPRESSION: 1. Stable appearance to post ablation change at the lower pole of the left kidney, with a small 1.2 cm focus of enhancing soft  tissue again noted along the posterior aspect of the region, thought to reflect enhancing scar tissue. No evidence of local recurrence. 2. Question of mild soft tissue inflammation at the right lower quadrant, incompletely imaged on this study. Would correlate clinically for any evidence of appendicitis, as the appendix is not imaged on this study. 3. Diffuse aortic atherosclerosis. 4. 6 mm nonobstructing stone at the interpole region of the right kidney. 5. Bleb at the right lung base. Scattered calcification at the aortic and mitral valves. Scattered coronary artery calcifications seen.  Assessment/Plan:  Ms. EVAH RASHID is a 66 y.o. female on chronic anticoagulation for PAF and baseline AoCD who presents with acute onset of abd pain and dark stools found to be anemic to Hgb 6.3.  Iron deficiency anemia due to GI bleed. She has an history of melena, on chronic anticoagulation, hemoglobin increased from 6.3-7.4 after 1 unit of blood. Hasn't history of AVM in small intestine. -Might benefit from IV iron, as oral supplement might be causing her current abdominal pain. -GI is following, they will do EGD tomorrow according to their notes. According to patient they are going to do EGD and colonoscopy both tomorrow morning.  Abdominal pain. She has this intermittent abdominal pain for 1-2 months. History was positive for constipation, had one episode of melena. Her iron supplement was stopped earlier because of this intermittent pain. Her repeat CT abdomen shows stable post-ablation changes, possible mild soft tissue inflammation at right lower quadrant and a small nonobstructing stone in right kidney. -Might get benefit with gentle bowel regimen.  Type 2 diabetes. Recent A1c 7.6,on 12/14/15. Morning CBC was above 200. -Increase Levemir to 30 units daily at bedtime. -Continue NovoLog 3 units 3 times daily with meals. -Continue SSI.  UTI / with h/o Recurrent vulvovaginal candidiasis:  Her UA look infected. She has history of candidiasis after taking antibiotics in the past. -. Urine culture results pending. -Continue Rocephine-we will narrow antibiotics according to culture and sensitivity results.  PAF and s/p mitral valve repair. On chronic anticoagulation with Coumadin. -Hold Coumadin- can be restarted after EGD.  HFpEF: Currently not working overload. According to patient she uses Lasix when necessary. -Hold Lasix-can be given if needed.  COPD / OSA:  no current exacerbation. Continue home Advair and Spiriva. CPAP qHS.  Depression / Anxiety: She  has been feeling very low lately.  -Continue home meds: fluoxetine and alprazolam  Chronic Pain / Fibromyalgia. -Continue home meds Norvo 5-325 1-2 tabs q6h PRN, Flexeril 30m TID PRN.  Dispo: Anticipated discharge in approximately 1-2 day(s).   SLorella Nimrod MD 03/28/2016, 12:07 PM Pager: [phone]

## 2016-03-28 NOTE — Patient Outreach (Signed)
    Telephone call to patient to follow up her emergency visit to the [facility] ED. Patient advised RNCM she is currently inpatient, plan is for her to have a GI workup during her acute stay.  RNCM advised Natividad Brood, [facility] Liaison of patient's admission to the hospital.  Plan: Attempt to re-engage patient once she is discharged from the acute care setting.

## 2016-03-28 NOTE — Telephone Encounter (Signed)
called

## 2016-03-28 NOTE — Consult Note (Signed)
[place] Reason for consult: anemia history of dark stools Referring Physician: internal medicine teaching service. Primary G.I.: Dr. Clyda Hurdle is an 66 y.o. female.  HPI: she has a long and complicated history. She has had a history of PAF and mitral valve replacement required her to be permanently anticoagulated. Her other problems include severe COPD, OSA, glucose intolerance, GERD. She is had: polyps removed in the past by [name] as well as here in the hospital. Last colonoscopy 2017. She had EGD at that time. She apparently also had a capsule endoscopy showing AVMs in the small bowel but I'm unable to find the actual report but this is referenced in her medical records. She is on chronic omeprazole for her GERD. She was admitted with a history of several dark stools vague abdominal pain that appears to be in both the upper and lower abdomen. We do not have a stool check for OB but her hemoglobin was 6.3. She is on chronic Coumadin because of cardiovascular issues and her INR on admission was 2.5 she denies melena or hematemesis. Were asked to see her about her low hemoglobin.  Past Medical History:  Diagnosis Date  . Adenomatous polyps 05/14/2011   Colonoscopy (05/2011): 4 mm adenomatous polyp excised endoscopically Colonoscopy (02/2002): Adenomatous polyp excised endoscopically   . Allergic rhinitis 06/01/2012  . Anemia of chronic disease 01/01/2013  . Anxiety 07/24/2010  . Aortic atherosclerosis ([place]) 10/19/2014   Seen on CT scan, currently asymptomatic  . 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 Asymptomatic cholelithiasis 09/25/2015   Seen on CT scan 08/2015  . Bilateral cataracts 08/04/2012   Visually insignificant   . Carotid artery stenosis    s/p right endarterectomy (06/2010) Carotid US (07/2010):  Left: Moderate-to-severe (60-79%) calcific and non-calcific plaque  origin and proximal ICA and ECA   . Chronic congestive heart failure with left ventricular diastolic dysfunction ([place]) 10/21/2010  . Chronic constipation 02/03/2011  . Chronic daily headache 01/16/2014  . Chronic low back pain 10/06/2012  . Chronic venous insufficiency 08/04/2012  . Clear cell renal cell carcinoma ([place]) 07/21/2011   s/p cryoablation of left RCC in 09/2011 by [name]. Followed by [name]  [facility]) .    Marland Kitchen COPD (chronic obstructive pulmonary disease) with emphysema (HCC)    PFTs 02/2012: FEV1 0.92 (40%), ratio 69, 27% increase in FEV1 with BD, TLC 91%, severe airtrapping, DLCO49% On chronic home O2. Pulmonary rehab referral 05/2012   . Depression 11/19/2005  . Esophageal stenosis 08/08/2015   Mild, benign-appearing on EGD 06/29/2015  . Fibromyalgia 08/29/2010  . Gastroesophageal reflux disease   . History of blood transfusion    "several times"   . Hyperlipidemia LDL goal < 100 11/20/2005  . Internal hemorrhoids 08/04/2012  . Mitral stenosis    s/p Mitral valve replacement with a 27-mm pericardial porcine valve (Medtronic Mosaic valve, serial #[id number] on 09/20/10, Dr. Prescott Gum)   . Moderate to severe pulmonary hypertension    2014 TEE w PA peak pressure 46 mmHg, s/p MV replacement   . Obesity (BMI 30.0-34.9) 10/23/2011  . Obstructive sleep apnea    Nocturnal polysomnography (06/2009): Moderate sleep apnea/ hypopnea syndrome , AHI 17.8 per hour with nonpositional hypopneas. CPAP titration to 12 CWP, AHI 2.4 per hour. On nocturnal CPAP via a small resMed Quattro full-face mask with heated humidifier.   . Osteoporosis    DEXA (12/09/2011): L-spine T -  3.7, left hip T -1.4 DEXA (12/2004): L-spine T -2.6, left hip -0.1   . Paroxysmal atrial fibrillation ([place]) 10/22/2010   s/p Left atrial maze procedure for paroxysmal atrial fibrillation on 09/20/2010 by Dr Prescott Gum.  Subsequent splenic infarct, decision was made to re-anticoagulate with coumadin, likely life-long as  this is the most likely cause of the splenic infarct.   . Right nephrolithiasis 09/06/2014   5 mm non-obstructing calculus seen on CT scan 09/05/2014   . Seborrheic keratosis 09/28/2015  . Shortness of breath dyspnea   . Tobacco abuse 07/28/2012  . Type 2 diabetes mellitus with diabetic neuropathy [facility])     Past Surgical History:  Procedure Laterality Date  . CARDIAC VALVE REPLACEMENT  Aug. 2012   "mitral valve"  . CAROTID ENDARTERECTOMY Right 07/04/2010   by [name] for asymptomatic right carotid artery stenosis  . CHEST TUBE INSERTION  09/24/2010   Dr Prescott Gum  . COLONOSCOPY  05/12/2011   performed by Dr. Michail Sermon. Showing small internal hemorrhoids, single tubular adenoma polyp  . CRYOABLATION Left 09/2011   by [name]. Followed by [name]  [facility]) .    Marland Kitchen DILATION AND CURETTAGE OF UTERUS    . ESOPHAGOGASTRODUODENOSCOPY  05/12/2011   performed by Dr. Michail Sermon. Negative for ulcerations, biopsy negative for evidence of celiac sprue  . ESOPHAGOGASTRODUODENOSCOPY N/A 06/29/2015   Procedure: ESOPHAGOGASTRODUODENOSCOPY (EGD);  Surgeon: Clarene Essex, MD;  Location[facility] ENDOSCOPY;  Service: Endoscopy;  Laterality: N/A;  . FRACTURE SURGERY Right 2005   clavicle  . GIVENS CAPSULE STUDY N/A 06/30/2015   Procedure: GIVENS CAPSULE STUDY;  Surgeon: Clarene Essex, MD;  Location: [facility] ENDOSCOPY;  Service: Endoscopy;  Laterality: N/A;  . GIVENS CAPSULE STUDY N/A 06/29/2015   Procedure: GIVENS CAPSULE STUDY;  Surgeon: Clarene Essex, MD;  Location: [place];  Service: Endoscopy;  Laterality: N/A;  . Hysteroscopy with endometrial ablation  06/2001   for persistent post-menopausal bleeding // by S. [name], M.D.  . IR GENERIC HISTORICAL  08/23/2015   IR RADIOLOGIST EVAL & MGMT 08/23/2015 Aletta Edouard, MD [facility] INTERV RAD  . LIPOMA EXCISION  08/2005   occipital lipoma 1.5cm - by Dr. [name]  . MAZE Left 09/20/10   for paroxysmal atrial fibrillation (Dr. Prescott Gum)  . MITRAL VALVE  REPLACEMENT  09/20/10    with a 27-mm pericardial porcine valve (Medtronic Mosaic valve, serial #[id number]). 09/20/10, Dr Prescott Gum  . ORIF CLAVICLE FRACTURE  01/2004   by Thana Farr. [name], M.D for Right clavicle nonunion.  . TONSILLECTOMY    . TUBAL LIGATION      Family History  Problem Relation Age of Onset  . Peptic Ulcer Disease Father   . Heart attack Father 28    Died of MI at age 61  . Heart attack Brother 2    Died of MI at age 64  . Obesity Brother   . Pneumonia Mother   . Healthy Sister   . Lupus Daughter   . Obsessive Compulsive Disorder Daughter     Social History:  reports that she has been smoking Cigarettes.  She started smoking about 53 years ago. She has a 53.00 pack-year smoking history. She has never used smokeless tobacco. She reports that she does not drink alcohol or use drugs.  Allergies:  Allergies  Allergen Reactions  . Morphine And Related Other (See Comments)  . Oxycontin [Oxycodone] Other (See Comments)    headache  . Tramadol Hcl     REACTION: swelling  .  Lorazepam Other (See Comments)    Patient's sister noted that ativan caused the patient to become extremely confused during hospitalization 09/2010; tolerates Xanax    Medications; Prior to Admission medications   Medication Sig Start Date End Date Taking? Authorizing Provider  albuterol (PROAIR HFA) 108 (90 Base) MCG/ACT inhaler Inhale 1-2 puffs into the lungs every 6 (six) hours as needed for shortness of breath. 11/14/15  Yes [name], MD  ALPRAZolam [name]) 1 MG tablet Take 0.5-1 tablets (0.5-1 mg total) by mouth 2 (two) times daily as needed for anxiety. 02/13/16  Yes [name], MD  Blood Glucose Monitoring Suppl (ONETOUCH VERIO) W/DEVICE KIT 1 each by Does not apply route 4 (four) times daily. 07/25/14  Yes [name], MD  chlorpheniramine (CHLOR-TRIMETON) 4 MG tablet Take 1 tablet (4 mg total) by mouth 3 (three) times daily as needed for allergies. 10/06/14  Yes [name], MD   cyclobenzaprine (FLEXERIL) 5 MG tablet Take 1 tablet (5 mg total) by mouth 3 (three) times daily as needed for muscle spasms. 01/11/16  Yes [name], MD  EASY TOUCH PEN NEEDLES 31G X 5 MM MISC USE TO INJECT INSULIN 5 TIMES DAILY 07/26/15  Yes [name], MD  FLUoxetine (PROZAC) 20 MG tablet Take 2 tablets (40 mg total) by mouth daily. 12/14/15  Yes [name], MD  fluticasone (FLONASE) 50 MCG/ACT nasal spray Place 2 sprays into both nostrils daily. 02/08/15  Yes [name], MD  Fluticasone-Salmeterol (ADVAIR DISKUS) 500-50 MCG/DOSE AEPB Inhale 1 puff into the lungs 2 (two) times daily. 12/14/15  Yes [name], MD  furosemide (LASIX) 80 MG tablet Take 1 tablet (80 mg total) by mouth 2 (two) times daily. Patient taking differently: Take 80 mg by mouth 2 (two) times daily as needed for fluid.  06/28/15  Yes [name], MD  gabapentin (NEURONTIN) 300 MG capsule Take 2 capsules (600 mg total) by mouth 3 (three) times daily. 11/16/15  Yes [name], MD  HYDROcodone-acetaminophen (NORCO/VICODIN) 5-325 MG tablet Take 1-2 tablets by mouth every 6 (six) hours as needed for severe pain. 11/16/15  Yes [name], MD  Insulin Detemir (LEVEMIR FLEXTOUCH) 100 UNIT/ML Pen Inject 20 Units into the skin daily at 10 pm. Patient taking differently: Inject 40 Units into the skin daily.  02/13/16  Yes [name], MD  Lancets Misc. (ACCU-CHEK FASTCLIX LANCET) KIT Check your blood 4 times a day dx code 250.00 insulin requiring 01/19/13  Yes [name], MD  lidocaine (XYLOCAINE) 2 % jelly APPLY TO AFFECTED AREA THREE TIMES A DAY Patient taking differently: APPLY TO AFFECTED AREA THREE TIMES A DAY AS NEEDED FOR PAIN 09/19/15  Yes Sid Falcon, MD  NOVOLOG 100 UNIT/ML injection USE TO FILL VGO40 TO WEAR DAILY. Patient taking differently: 50-60 units three to four times a day as needed for sugar levels 03/04/16  Yes [name], MD  nystatin cream (MYCOSTATIN) APPLY TOPICALLY TWO TIMES  DAILY. Patient taking differently: Apply 1 application topically 2 (two) times daily as needed for dry skin.  01/11/16  Yes [name], MD  omeprazole (PRILOSEC) 40 MG capsule Take 1 capsule (40 mg total) by mouth 2 (two) times daily. 08/22/15  Yes [name], MD  [facility] VERIO test strip Check blood sugar up to 4 times a day 02/28/15  Yes [name], MD  potassium chloride SA (K-DUR,KLOR-CON) 20 MEQ tablet TAKE 1 TABLET BY MOUTH TWICE DAILY Patient taking differently: TAKE 1 TABLET BY MOUTH TWICE DAILY AS NEEDED WHEN TAKING  LASIX 09/19/15  Yes Sid Falcon, MD  promethazine (PHENERGAN) 12.5 MG tablet Take 1 tablet (12.5 mg total) by mouth every 8 (eight) hours as needed for nausea or vomiting. 07/13/15  Yes Alex Ronnie Derby, DO  rosuvastatin (CRESTOR) 20 MG tablet TAKE 1 TABLET BY MOUTH EVERY NIGHT AT BEDTIME 09/19/15  Yes Sid Falcon, MD  SPIRIVA HANDIHALER 18 MCG inhalation capsule INHALE THE CONTENTS OF 1 CAPSULE VIA HANDIHALER BY MOUTH EVERY DAY 09/19/15  Yes Sid Falcon, MD  warfarin (COUMADIN) 5 MG tablet Take 0.5 tablets (2.5 mg total) by mouth daily at 6 PM. 03/04/16  Yes [name], MD  ferrous gluconate (FERGON) 324 MG tablet Take 1 tablet (324 mg total) by mouth 2 (two) times daily with a meal. Patient not taking: Reported on 03/27/2016 11/16/15   [name], MD  Insulin Disposable Pump (V-GO 40) KIT FILL AND PLACE VGO40 ABOUT THE SAME TIME EACH DAY. USE 4 TO 5 CLICKS WITH MEALS AND 2 CLICKS WITH SNACKS Patient not taking: Reported on 03/27/2016 03/04/16   [name], MD   . cefTRIAXone (ROCEPHIN)  IV  1 g Intravenous Q24H  . FLUoxetine  40 mg Oral Daily  . gabapentin  600 mg Oral TID  . insulin aspart  0-15 Units Subcutaneous TID WC  . insulin aspart  3 Units Subcutaneous TID WC  . insulin detemir  20 Units Subcutaneous Q2200  . iopamidol      . nicotine  7 mg Transdermal Daily  . nystatin cream   Topical BID  . pantoprazole (PROTONIX) IV  40 mg Intravenous Q12H  .  tiotropium  18 mcg Inhalation Daily   PRN Meds acetaminophen, albuterol, ALPRAZolam, HYDROcodone-acetaminophen Results for orders placed or performed during the hospital encounter of 03/27/16 (from the past 48 hour(s))  Lipase, blood     Status: None   Collection Time: 03/27/16  4:45 PM  Result Value Ref Range   Lipase 11 11 - 51 U/L  Comprehensive metabolic panel     Status: Abnormal   Collection Time: 03/27/16  4:45 PM  Result Value Ref Range   Sodium 132 (L) 135 - 145 mmol/L   Potassium 4.2 3.5 - 5.1 mmol/L   Chloride 96 (L) 101 - 111 mmol/L   CO2 27 22 - 32 mmol/L   Glucose, Bld 263 (H) 65 - 99 mg/dL   BUN 13 6 - 20 mg/dL   Creatinine, Ser 0.93 0.44 - 1.00 mg/dL   Calcium 8.5 (L) 8.9 - 10.3 mg/dL   Total Protein 6.2 (L) 6.5 - 8.1 g/dL   Albumin 3.3 (L) 3.5 - 5.0 g/dL   AST 17 15 - 41 U/L   ALT 12 (L) 14 - 54 U/L   Alkaline Phosphatase 41 38 - 126 U/L   Total Bilirubin 0.3 0.3 - 1.2 mg/dL   GFR calc non Af Amer >60 >60 mL/min   GFR calc Af Amer >60 >60 mL/min    Comment: (NOTE) The eGFR has been calculated using the CKD EPI equation. This calculation has not been validated in all clinical situations. eGFR's persistently <60 mL/min signify possible Chronic Kidney Disease.    Anion gap 9 5 - 15  CBC     Status: Abnormal   Collection Time: 03/27/16  4:45 PM  Result Value Ref Range   WBC 12.0 (H) 4.0 - 10.5 K/uL   RBC 2.54 (L) 3.87 - 5.11 MIL/uL   Hemoglobin 6.3 (LL) 12.0 - 15.0 g/dL  Comment: REPEATED TO VERIFY CRITICAL RESULT CALLED TO, READ BACK BY AND VERIFIED WITH: A BORST,RN 03/27/16 1720 RHOLMES    HCT 21.1 (L) 36.0 - 46.0 %   MCV 83.1 78.0 - 100.0 fL   MCH 24.8 (L) 26.0 - 34.0 pg   MCHC 29.9 (L) 30.0 - 36.0 g/dL   RDW 18.3 (H) 11.5 - 15.5 %   Platelets 193 150 - 400 K/uL  Protime-INR     Status: Abnormal   Collection Time: 03/27/16  5:57 PM  Result Value Ref Range   Prothrombin Time 27.9 (H) 11.4 - 15.2 seconds   INR 2.54   Type and screen [place]     Status: None   Collection Time: 03/27/16  5:57 PM  Result Value Ref Range   ABO/RH(D) A NEG    Antibody Screen NEG    Sample Expiration 03/30/2016    Unit Number [id number]    Blood Component Type RED CELLS,LR    Unit division 00    Status of Unit ISSUED,FINAL    Transfusion Status OK TO TRANSFUSE    Crossmatch Result Compatible   Prepare RBC     Status: None   Collection Time: 03/27/16  5:57 PM  Result Value Ref Range   Order Confirmation ORDER PROCESSED BY BLOOD BANK   I-Stat CG4 Lactic Acid, ED     Status: None   Collection Time: 03/27/16  6:09 PM  Result Value Ref Range   Lactic Acid, Venous 1.76 0.5 - 1.9 mmol/L  Urinalysis, Routine w reflex microscopic     Status: Abnormal   Collection Time: 03/27/16  9:17 PM  Result Value Ref Range   Color, Urine YELLOW YELLOW   APPearance CLOUDY (A) CLEAR   Specific Gravity, Urine 1.006 1.005 - 1.030   pH 7.0 5.0 - 8.0   Glucose, UA >=500 (A) NEGATIVE mg/dL   Hgb urine dipstick SMALL (A) NEGATIVE   Bilirubin Urine NEGATIVE NEGATIVE   Ketones, ur NEGATIVE NEGATIVE mg/dL   Protein, ur NEGATIVE NEGATIVE mg/dL   Nitrite NEGATIVE NEGATIVE   Leukocytes, UA LARGE (A) NEGATIVE   RBC / HPF 0-5 0 - 5 RBC/hpf   WBC, UA TOO NUMEROUS TO COUNT 0 - 5 WBC/hpf   Bacteria, UA RARE (A) NONE SEEN   Squamous Epithelial / LPF 6-30 (A) NONE SEEN  Lactate dehydrogenase     Status: None   Collection Time: 03/27/16  9:26 PM  Result Value Ref Range   LDH 133 98 - 192 U/L  Iron and TIBC     Status: Abnormal   Collection Time: 03/27/16  9:26 PM  Result Value Ref Range   Iron 22 (L) 28 - 170 ug/dL   TIBC 445 250 - 450 ug/dL   Saturation Ratios 5 (L) 10.4 - 31.8 %   UIBC 423 ug/dL  Ferritin     Status: Abnormal   Collection Time: 03/27/16  9:26 PM  Result Value Ref Range   Ferritin 5 (L) 11 - 307 ng/mL  Reticulocytes     Status: Abnormal   Collection Time: 03/27/16  9:26 PM  Result Value Ref Range   Retic Ct Pct 4.0 (H) 0.4 -  3.1 %   RBC. 2.70 (L) 3.87 - 5.11 MIL/uL   Retic Count, Manual 108.0 19.0 - 186.0 K/uL  I-Stat CG4 Lactic Acid, ED     Status: Abnormal   Collection Time: 03/27/16  9:36 PM  Result Value Ref Range   Lactic Acid, Venous  2.04 (HH) 0.5 - 1.9 mmol/L   Comment NOTIFIED PHYSICIAN   Glucose, capillary     Status: Abnormal   Collection Time: 03/28/16  2:22 AM  Result Value Ref Range   Glucose-Capillary 209 (H) 65 - 99 mg/dL  CBC     Status: Abnormal   Collection Time: 03/28/16  4:14 AM  Result Value Ref Range   WBC 9.2 4.0 - 10.5 K/uL   RBC 2.93 (L) 3.87 - 5.11 MIL/uL   Hemoglobin 7.4 (L) 12.0 - 15.0 g/dL   HCT 24.6 (L) 36.0 - 46.0 %   MCV 84.0 78.0 - 100.0 fL   MCH 25.3 (L) 26.0 - 34.0 pg   MCHC 30.1 30.0 - 36.0 g/dL   RDW 17.3 (H) 11.5 - 15.5 %   Platelets 184 150 - 400 K/uL  Basic metabolic panel     Status: Abnormal   Collection Time: 03/28/16  4:14 AM  Result Value Ref Range   Sodium 139 135 - 145 mmol/L    Comment: DELTA CHECK NOTED   Potassium 4.4 3.5 - 5.1 mmol/L   Chloride 105 101 - 111 mmol/L   CO2 26 22 - 32 mmol/L   Glucose, Bld 200 (H) 65 - 99 mg/dL   BUN 9 6 - 20 mg/dL   Creatinine, Ser 0.94 0.44 - 1.00 mg/dL   Calcium 8.5 (L) 8.9 - 10.3 mg/dL   GFR calc non Af Amer >60 >60 mL/min   GFR calc Af Amer >60 >60 mL/min    Comment: (NOTE) The eGFR has been calculated using the CKD EPI equation. This calculation has not been validated in all clinical situations. eGFR's persistently <60 mL/min signify possible Chronic Kidney Disease.    Anion gap 8 5 - 15  Lactic acid, plasma     Status: None   Collection Time: 03/28/16  4:14 AM  Result Value Ref Range   Lactic Acid, Venous 1.1 0.5 - 1.9 mmol/L   *Note: Due to a large number of results and/or encounters for the requested time period, some results have not been displayed. A complete set of results can be found in Results Review.    Ct Abdomen W Wo Contrast  Result Date: 03/28/2016 CLINICAL DATA:  Acute onset of  generalized abdominal pain. Acute blood loss. Initial encounter. EXAM: CT ABDOMEN WITHOUT AND WITH CONTRAST TECHNIQUE: Multidetector CT imaging of the abdomen was performed following the standard protocol before and following the bolus administration of intravenous contrast. CONTRAST:  100 mL of Isovue 300 IV contrast COMPARISON:  CT of the abdomen performed 08/23/2015, and MRI of the lumbar spine performed 11/15/2015 FINDINGS: Lower chest: A bleb is noted at the right lung base. Scattered calcification is noted at the aortic and mitral valves, with postoperative change at the mitral valve. Scattered coronary artery calcifications are seen. Hepatobiliary: The liver is unremarkable in appearance. The gallbladder is unremarkable in appearance. The common bile duct remains normal in caliber. Trace fluid is seen about the liver. Pancreas: The pancreas is within normal limits. Spleen: The spleen is unremarkable in appearance. Adrenals/Urinary Tract: The adrenal glands are unremarkable in appearance. Post ablation changes at the posterior aspect of the lower pole of the left kidney are grossly unchanged from 2017. There is no evidence of recurrent mass, with a mixture fatty attenuation and soft tissue attenuation. Mild delayed enhancement at the small 1.2 cm soft tissue component along the posterior aspect of the ablation area is stable in appearance. A 6 mm nonobstructing stone is  noted at the interpole region of the right kidney. Mild nonspecific perinephric stranding is noted bilaterally. There is no evidence of hydronephrosis. No obstructing ureteral stones are identified. No perinephric stranding is seen. Stomach/Bowel: Visualized small and large bowel loops are grossly unremarkable in appearance. The stomach is largely decompressed and unremarkable. Vascular/Lymphatic: Diffuse calcification is seen along the abdominal aorta and its branches. The abdominal aorta is otherwise grossly unremarkable. The inferior vena  cava is grossly unremarkable. No retroperitoneal lymphadenopathy is seen. Other: There is question of mild soft tissue inflammation at the right lower quadrant. Would correlate clinically for any evidence of appendicitis, as the appendix is not imaged on this study. Musculoskeletal: No acute osseous abnormalities are identified. The visualized musculature is unremarkable in appearance. IMPRESSION: 1. Stable appearance to post ablation change at the lower pole of the left kidney, with a small 1.2 cm focus of enhancing soft tissue again noted along the posterior aspect of the region, thought to reflect enhancing scar tissue. No evidence of local recurrence. 2. Question of mild soft tissue inflammation at the right lower quadrant, incompletely imaged on this study. Would correlate clinically for any evidence of appendicitis, as the appendix is not imaged on this study. 3. Diffuse aortic atherosclerosis. 4. 6 mm nonobstructing stone at the interpole region of the right kidney. 5. Bleb at the right lung base. Scattered calcification at the aortic and mitral valves. Scattered coronary artery calcifications seen. Electronically Signed   By: Garald Balding M.D.   On: 03/28/2016 01:53   ROS: she basically is limited by her heart disease and COPD and severe OSA. She's not had diarrhea or gross hematochezia other than the dark stools.            Blood pressure (!) 131/37, pulse 77, temperature 98.4 F (36.9 C), temperature source Oral, resp. rate 18, height _0  (1.575 m), weight 88.7 kg (195 lb 9.6 oz), SpO2 100 %.  Physical exam:   General-- morbidly obese white female with mask for her sleep apnea in place ENT-- nonicteric Neck-- obese but soft full range of motion Heart-- diminished heart sounds to not appear clear murmur Lungs-- diminished breath sounds with slight wheezes equal bilaterally Abdomen-- morbidly obese with mild nonlocalizing tenderness throughout the abdomen    Assessment: 1.  Anemia with history of dark stools. She's not chronically bleeding or be lending creatinine are normal. She's not taken any NSAIDS. Think it would be reasonable to go ahead with EGD would like her INR to come down a bit more 1st. 2. PAF on chronic anticoagulation 3. History of MVR. Records indicate this was a porcine valve. This may be somewhere in her old records not available in epic. 4. Type II diabetes with neuropathy 5. COPD with severe OSA on CPAP 6. History of colon polyps. Last colonoscopy revealed small polyp they appear to be up-to-date 7. History of AVMs in the small intestine spot capsule endoscopy. Report apparently not available in epic 8. History of clear cell renal cell carcinoma. Status post cryoablation (lesion 8/13 9. Morbid obesity   Plan: 1. Will go ahead and give her clear liquids today and maker NPO after midnight for probable EGD tomorrow. Hopefully her PT will be down somewhat by then. 2. Will check her stools for occult blood   Ezzie Senat JR,Hollie Wojahn L 03/28/2016, 7:55 AM   This note was created using voice recognition software and minor errors may Have occurred unintentionally. Pager: [phone] If no answer or after hours call [phone]

## 2016-03-28 NOTE — ED Notes (Signed)
Patient transported to CT w/ RN.

## 2016-03-28 NOTE — Telephone Encounter (Signed)
Requesting to speak with [name]. Please call back.

## 2016-03-28 NOTE — Consult Note (Signed)
   [facility] CM Inpatient Consult   03/28/2016  [name] [birth year] [phone]  Patient is currently active with [facility] for chronic disease management services in the [place] Registry.  Patient has been engaged by a [employer].  Our community based plan of care has focused on disease management and community resource support. Active consent on file. Met with the patient at the bedside.  Patient consents to ongoing follow up. She is scheduled for a procedure tomorrow.    Patient will receive a post discharge transition of care call and will be evaluated for monthly home visits for assessments and disease process education.  Made Inpatient Case Manager aware that [facility] following. Of note, [facility] Care Management services does not replace or interfere with any services that are needed or arranged by inpatient case management or social work.  For additional questions or referrals please contact:  Natividad Brood, RN BSN [name] Liaison  [phone] business mobile phone Toll free office [phone]

## 2016-03-28 NOTE — Progress Notes (Signed)
Inpatient Diabetes Program Recommendations  AACE/ADA: New Consensus Statement on Inpatient Glycemic Control (2015)  Target Ranges:  Prepandial:   less than 140 mg/dL      Peak postprandial:   less than 180 mg/dL (1-2 hours)      Critically ill patients:  140 - 180 mg/dL   Results for PRUDENCE, HEINY (MRN [id number]) as of 03/28/2016 09:31  Ref. Range 03/28/2016 02:22 03/28/2016 08:02  Glucose-Capillary Latest Ref Range: 65 - 99 mg/dL 209 (H) 219 (H)   Review of Glycemic Control  Current orders for Inpatient glycemic control: Levemir 20 units QHS, Novolog 0-15 units TID with meals, Novolog 3 units TID with meals for meal coverage  Inpatient Diabetes Program Recommendations: Insulin - Basal: Please consider increasing Levemir to 30 units QHS. Correction (SSI): Please consider ordering Novolog 0-5 units QHS for bedtime correction.  Thanks, Barnie Alderman, RN, MSN, CDE Diabetes Coordinator Inpatient Diabetes Program [phone] (Team Pager from 8am to 5pm)

## 2016-03-29 ENCOUNTER — Encounter (HOSPITAL_COMMUNITY)
Admission: EM | Disposition: A | Discharge status: Home / Self Care | Payer: Self-pay | Source: Home / Self Care | Attending: Internal Medicine

## 2016-03-29 ENCOUNTER — Encounter (HOSPITAL_COMMUNITY): Payer: Self-pay | Admitting: *Deleted

## 2016-03-29 DIAGNOSIS — M545 Low back pain: Secondary | ICD-10-CM

## 2016-03-29 DIAGNOSIS — I480 Paroxysmal atrial fibrillation: Secondary | ICD-10-CM

## 2016-03-29 DIAGNOSIS — D638 Anemia in other chronic diseases classified elsewhere: Secondary | ICD-10-CM

## 2016-03-29 DIAGNOSIS — N390 Urinary tract infection, site not specified: Secondary | ICD-10-CM

## 2016-03-29 DIAGNOSIS — Z8742 Personal history of other diseases of the female genital tract: Secondary | ICD-10-CM

## 2016-03-29 DIAGNOSIS — G4733 Obstructive sleep apnea (adult) (pediatric): Secondary | ICD-10-CM

## 2016-03-29 DIAGNOSIS — B9689 Other specified bacterial agents as the cause of diseases classified elsewhere: Secondary | ICD-10-CM

## 2016-03-29 DIAGNOSIS — E1140 Type 2 diabetes mellitus with diabetic neuropathy, unspecified: Secondary | ICD-10-CM

## 2016-03-29 DIAGNOSIS — G8929 Other chronic pain: Secondary | ICD-10-CM

## 2016-03-29 DIAGNOSIS — D62 Acute posthemorrhagic anemia: Secondary | ICD-10-CM

## 2016-03-29 DIAGNOSIS — J449 Chronic obstructive pulmonary disease, unspecified: Secondary | ICD-10-CM

## 2016-03-29 DIAGNOSIS — K449 Diaphragmatic hernia without obstruction or gangrene: Secondary | ICD-10-CM

## 2016-03-29 DIAGNOSIS — Z885 Allergy status to narcotic agent status: Secondary | ICD-10-CM

## 2016-03-29 DIAGNOSIS — Q2733 Arteriovenous malformation of digestive system vessel: Secondary | ICD-10-CM

## 2016-03-29 DIAGNOSIS — I5032 Chronic diastolic (congestive) heart failure: Secondary | ICD-10-CM

## 2016-03-29 DIAGNOSIS — F418 Other specified anxiety disorders: Secondary | ICD-10-CM

## 2016-03-29 DIAGNOSIS — Z888 Allergy status to other drugs, medicaments and biological substances status: Secondary | ICD-10-CM

## 2016-03-29 DIAGNOSIS — Z79899 Other long term (current) drug therapy: Secondary | ICD-10-CM

## 2016-03-29 DIAGNOSIS — K219 Gastro-esophageal reflux disease without esophagitis: Secondary | ICD-10-CM

## 2016-03-29 DIAGNOSIS — K922 Gastrointestinal hemorrhage, unspecified: Secondary | ICD-10-CM

## 2016-03-29 DIAGNOSIS — M797 Fibromyalgia: Secondary | ICD-10-CM

## 2016-03-29 DIAGNOSIS — Z794 Long term (current) use of insulin: Secondary | ICD-10-CM

## 2016-03-29 DIAGNOSIS — C649 Malignant neoplasm of unspecified kidney, except renal pelvis: Secondary | ICD-10-CM

## 2016-03-29 HISTORY — PX: ESOPHAGOGASTRODUODENOSCOPY: SHX5428

## 2016-03-29 LAB — TYPE AND SCREEN
ABO/RH(D): A NEG
Antibody Screen: NEGATIVE
Unit division: 0
Unit division: 0

## 2016-03-29 LAB — GLUCOSE, CAPILLARY
Glucose-Capillary: 171 mg/dL — ABNORMAL HIGH (ref 65–99)
Glucose-Capillary: 117 mg/dL — ABNORMAL HIGH (ref 65–99)

## 2016-03-29 LAB — CBC
HCT: 29.4 % — ABNORMAL LOW (ref 36.0–46.0)
Hemoglobin: 8.9 g/dL — ABNORMAL LOW (ref 12.0–15.0)
MCH: 25.6 pg — ABNORMAL LOW (ref 26.0–34.0)
MCHC: 30.3 g/dL (ref 30.0–36.0)
MCV: 84.5 fL (ref 78.0–100.0)
Platelets: 191 10*3/uL (ref 150–400)
RBC: 3.48 MIL/uL — ABNORMAL LOW (ref 3.87–5.11)
RDW: 17.2 % — ABNORMAL HIGH (ref 11.5–15.5)
WBC: 8 10*3/uL (ref 4.0–10.5)

## 2016-03-29 LAB — PROTIME-INR
INR: 2.26
Prothrombin Time: 25.3 seconds — ABNORMAL HIGH (ref 11.4–15.2)

## 2016-03-29 LAB — URINE CULTURE

## 2016-03-29 SURGERY — EGD (ESOPHAGOGASTRODUODENOSCOPY)
Anesthesia: Moderate Sedation

## 2016-03-29 MED ORDER — MIDAZOLAM HCL 10 MG/2ML IJ SOLN
INTRAMUSCULAR | Status: DC | PRN
  Administered 2016-03-29 (×2): 1 mg via INTRAVENOUS

## 2016-03-29 MED ORDER — MIDAZOLAM HCL 5 MG/ML IJ SOLN
INTRAMUSCULAR | Status: AC
  Filled 2016-03-29: qty 2

## 2016-03-29 MED ORDER — FENTANYL CITRATE (PF) 100 MCG/2ML IJ SOLN
INTRAMUSCULAR | Status: DC | PRN
  Administered 2016-03-29: 25 ug via INTRAVENOUS

## 2016-03-29 MED ORDER — CEFDINIR 300 MG PO CAPS: 300.0000 mg | ORAL_CAPSULE | Freq: Two times a day (BID) | ORAL | 0 refills | Status: DC

## 2016-03-29 MED ORDER — SODIUM CHLORIDE 0.9 % IV SOLN
510.0000 mg | Freq: Once | INTRAVENOUS | Status: AC
  Administered 2016-03-29: 510 mg via INTRAVENOUS
  Filled 2016-03-29: qty 17

## 2016-03-29 MED ORDER — ASPIRIN EC 325 MG PO TBEC: 325.0000 mg | DELAYED_RELEASE_TABLET | Freq: Every day | ORAL | 0 refills | Status: DC

## 2016-03-29 MED ORDER — FENTANYL CITRATE (PF) 100 MCG/2ML IJ SOLN
INTRAMUSCULAR | Status: AC
  Filled 2016-03-29: qty 2

## 2016-03-29 MED ORDER — DIPHENHYDRAMINE HCL 50 MG/ML IJ SOLN
INTRAMUSCULAR | Status: AC
  Filled 2016-03-29: qty 1

## 2016-03-29 MED ORDER — BUTAMBEN-TETRACAINE-BENZOCAINE 2-2-14 % EX AERO
INHALATION_SPRAY | CUTANEOUS | Status: DC | PRN
  Administered 2016-03-29: 2 via TOPICAL

## 2016-03-29 NOTE — Op Note (Signed)
[facility] Patient Name: [name] Procedure Date : 03/29/2016 MRN: [id number] Attending MD: Clarene Essex , MD Date of Birth: 06-Jan-1951 CSN: [id number] Age: 66 Admit Type: Inpatient Procedure:                Upper GI endoscopy Indications:              Iron deficiency anemia secondary to chronic blood                            loss Providers:                Clarene Essex, MD, Vista Lawman, RN, Corliss Parish,                            Technician Referring MD:              Medicines:                Fentanyl 25 micrograms IV, Midazolam 2 mg IV Complications:            No immediate complications. Estimated Blood Loss:     Estimated blood loss: none. Procedure:                Pre-Anesthesia Assessment:                           - Prior to the procedure, a History and Physical                            was performed, and patient medications and                            allergies were reviewed. The patient's tolerance of                            previous anesthesia was also reviewed. The risks                            and benefits of the procedure and the sedation                            options and risks were discussed with the patient.                            All questions were answered, and informed consent                            was obtained. Prior Anticoagulants: The patient has                            taken Coumadin (warfarin), last dose was 2 days                            prior to procedure. ASA Grade Assessment: II - A  patient with mild systemic disease. After reviewing                            the risks and benefits, the patient was deemed in                            satisfactory condition to undergo the procedure.                           After obtaining informed consent, the endoscope was                            passed under direct vision. Throughout the                            procedure, the  patient's blood pressure, pulse, and                            oxygen saturations were monitored continuously. The                            EG-[id number] ([id number]) scope was introduced through the                            mouth, and advanced to the third part of duodenum.                            The upper GI endoscopy was accomplished without                            difficulty. The patient tolerated the procedure                            well. Scope In: Scope Out: Findings:      The examined esophagus was normal except some debris versus mild candida.      The entire examined stomach was normal.      The duodenal bulb, first portion of the duodenum, second portion of the       duodenum and third portion of the duodenum were normal.      The exam was otherwise without abnormality. Impression:               - Normal esophagus.                           - Normal stomach.                           - Normal duodenal bulb, first portion of the                            duodenum, second portion of the duodenum and third                            portion of the duodenum.                           -  The examination was otherwise normal.                           - No specimens collected. Moderate Sedation:      Moderate (conscious) sedation was administered by the endoscopy nurse       and supervised by the endoscopist. The following parameters were       monitored: oxygen saturation, heart rate, blood pressure, respiratory       rate, EKG, adequacy of pulmonary ventilation, and response to care. Recommendation:           - Patient has a contact number available for                            emergencies. The signs and symptoms of potential                            delayed complications were discussed with the                            patient. Return to normal activities tomorrow.                            Written discharge instructions were provided to the                             patient.                           - Soft diet today. slowly advance to regular                            diabetic diet as tolerated                           - Continue present medications.                           - Return to GI clinic PRN.                           - Telephone GI clinic if symptomatic PRN. we will                            check our office computer to see when last                            colonoscopy was but believe she would be best                            served with IV iron and oral iron and close                            hemoglobin checks by primary care and consider an  enteroscope at some point and happy see back in the                            office as needed but with Capsule Endoscopy Showing                            AVMs and Nondiagnostic CT AVMs are Probably the                            Cause of Her Occasional Anemia and consider any                            other options regarding her anticoagulation Procedure Code(s):        --- Professional ---                           [phone], Esophagogastroduodenoscopy, flexible,                            transoral; diagnostic, including collection of                            specimen(s) by brushing or washing, when performed                            (separate procedure) Diagnosis Code(s):        --- Professional ---                           D50.0, Iron deficiency anemia secondary to blood                            loss (chronic) CPT copyright 2016 American Medical Association. All rights reserved. The codes documented in this report are preliminary and upon coder review may  be revised to meet current compliance requirements. Clarene Essex, MD 03/29/2016 11:49:29 AM This report has been signed electronically. Number of Addenda: 0

## 2016-03-29 NOTE — Progress Notes (Signed)
Patient was discharged home by MD order; discharged instructions  review and give to patient with care notes and prescriptions; IV DIC; patient will be escorted to the car by nurse tech via wheelchair.

## 2016-03-29 NOTE — Progress Notes (Signed)
[name] 11:30 AM  Subjective: Patient seen and examined and hospital computer chart reviewed and case discussed with my partner Dr. Oletta Lamas patient seen by me last me and unfortunately she did stop her iron when her left lower quadrant abdominal pain started and her previous workup was reviewed and she has no new complaints  Objective: Vital signs stable afebrile no acute distress exam please see preassessment evaluation labs and CT reviewed  Assessment: Anemia probable AVMs based on previous capsule in patient on Coumadin  Plan: Okay to proceed with endoscopy with INR elevated which might assist [place] in finding bleeding source  [facility]  Pager [phone] After 5PM or if no answer call [phone]

## 2016-03-29 NOTE — Progress Notes (Signed)
   Subjective: Patient was feeling better when seen this morning. Still having mild left lower quadrant discomfort. Denies any nausea, vomiting or diarrhea. She was nothing by mouth and waiting for EGD.  Objective:  Vital signs in last 24 hours: Vitals:   03/29/16 1150 03/29/16 1155 03/29/16 1200 03/29/16 1205  BP: (!) 121/57 (!) 142/30 (!) 134/48 (!) 141/37  Pulse: 78 72 70 68  Resp: _0 Temp:      TempSrc:      SpO2: 97% 93% 92% 91%  Weight:      Height:       Gen. Well-developed, obese lady, in no distress. Chest. Clear bilaterally. CVS. Normal rate and rhythm. Abdomen. Soft, obese, mild tenderness along the left lower quadrant, bowel sounds positive. Extremities. No edema, no cyanosis, pulses 2+ bilaterally.  Labs. CBC Latest Ref Rng & Units 03/29/2016 03/28/2016 03/28/2016  WBC 4.0 - 10.5 K/uL 8.0 - 8.4  Hemoglobin 12.0 - 15.0 g/dL 8.9(L) 8.3(L) 7.3(L)  Hematocrit 36.0 - 46.0 % 29.4(L) 27.3(L) 23.8(L)  Platelets 150 - 400 K/uL 191 - 175   INR. 2.26  Assessment/Plan:  Ms.Kathryn A Elkholyis a 66 y.o.femaleon chronic anticoagulation for PAF and baseline AoCD who presents with acute onset of abd pain and dark stools found to be anemic to Hgb 6.3.  Iron deficiency anemia due to GI bleed. Most likely due to her AVM found on Sole endoscopy. EGD done today was normal. She got her second unit of blood yesterday, hemoglobin this morning was 8.9. She was also given Feraheme IV today after EGD. -Daily iron gluconate. -Stop Coumadin. -Start her on aspirin 325 mg daily.  Abdominal pain.  she is having nonspecific, mild abdominal pain. Most likely due to constipation. -MiraLAX when necessary.  UTI / with h/o Recurrent vulvovaginal candidiasis. Her urine culture is growing multiple organisms, needs to be re-collected if needed. Her symptoms improved with ceftriaxone. -Can be discharged home on Cefdinir for a total of 5 days.  Type 2 diabetes. CBG between 117-171 over  last 24 hours. -Can be discharged  on her home regimen.  PAF and s/p mitral valve repair. She had porcine valve. Her CHADVASC was 4%. After discussing with patient about the risk and benefit to remain on anticoagulation, she decided to discontinue Coumadin. She understand  the risk including stroke well. -Discontinue Coumadin. -Start her on aspirin 325 mg daily.  HFpEF: Currently not working overload. According to patient she uses Lasix when necessary. -She can continue her home regimen.  COPD / OSA: no current exacerbation. Continue home Advair and Spiriva. CPAP qHS.  Depression / Anxiety: She has been feeling very low lately.  -Continue home meds: fluoxetine and alprazolam  Chronic Pain / Fibromyalgia. -Continue home meds Norvo 5-325 1-2 tabs q6h PRN, Flexeril 32m TID PRN.  Dispo: Being discharged today.  SLorella Nimrod MD 03/29/2016, 3:07 PM Pager: [phone]

## 2016-03-29 NOTE — Discharge Summary (Signed)
Name: [name] MRN: [id number] DOB: 10-Jul-1950 66 y.o. PCP: [name], MD  Date of Admission: 03/27/2016  4:34 PM Date of Discharge: 03/30/2016 Attending Physician: No att. providers found  Discharge Diagnosis: 1. Iron deficiency anemia due to GI bleed.  Principal Problem:   Acute blood loss anemia Active Problems:   Major depression, chronic   Anxiety   Gastroesophageal reflux disease with hiatal hernia   Type 2 diabetes mellitus with diabetic neuropathy (HCC)   Pulmonary emphysema (HCC)   Obstructive sleep apnea   Fibromyalgia   Chronic congestive heart failure with left ventricular diastolic dysfunction (HCC)   Paroxysmal atrial fibrillation (HCC)   Clear cell renal cell carcinoma (HCC)   Chronic low back pain   Anemia of chronic disease   Arteriovenous malformation of gastrointestinal tract   Discharge Medications: Allergies as of 03/29/2016      Reactions   Morphine And Related Other (See Comments)   Oxycontin [oxycodone] Other (See Comments)   headache   Tramadol Hcl    REACTION: swelling   Lorazepam Other (See Comments)   Patient's sister noted that ativan caused the patient to become extremely confused during hospitalization 09/2010; tolerates Xanax      Medication List    STOP taking these medications   warfarin 5 MG tablet Commonly known as:  COUMADIN     TAKE these medications   ACCU-CHEK FASTCLIX LANCET Kit Check your blood 4 times a day dx code 250.00 insulin requiring   albuterol 108 (90 Base) MCG/ACT inhaler Commonly known as:  PROAIR HFA Inhale 1-2 puffs into the lungs every 6 (six) hours as needed for shortness of breath.   ALPRAZolam 1 MG tablet Commonly known as:  XANAX Take 0.5-1 tablets (0.5-1 mg total) by mouth 2 (two) times daily as needed for anxiety.   aspirin EC 325 MG tablet Take 1 tablet (325 mg total) by mouth daily.   cefdinir 300 MG capsule Commonly known as:  OMNICEF Take 1 capsule (300 mg total) by mouth 2  (two) times daily.   chlorpheniramine 4 MG tablet Commonly known as:  CHLOR-TRIMETON Take 1 tablet (4 mg total) by mouth 3 (three) times daily as needed for allergies.   cyclobenzaprine 5 MG tablet Commonly known as:  FLEXERIL Take 1 tablet (5 mg total) by mouth 3 (three) times daily as needed for muscle spasms.   EASY TOUCH PEN NEEDLES 31G X 5 MM Misc Generic drug:  Insulin Pen Needle USE TO INJECT INSULIN 5 TIMES DAILY   ferrous gluconate 324 MG tablet Commonly known as:  FERGON Take 1 tablet (324 mg total) by mouth 2 (two) times daily with a meal.   FLUoxetine 20 MG tablet Commonly known as:  PROZAC Take 2 tablets (40 mg total) by mouth daily.   fluticasone 50 MCG/ACT nasal spray Commonly known as:  FLONASE Place 2 sprays into both nostrils daily.   Fluticasone-Salmeterol 500-50 MCG/DOSE Aepb Commonly known as:  ADVAIR DISKUS Inhale 1 puff into the lungs 2 (two) times daily.   furosemide 80 MG tablet Commonly known as:  LASIX Take 1 tablet (80 mg total) by mouth 2 (two) times daily. What changed:  when to take this  reasons to take this   gabapentin 300 MG capsule Commonly known as:  NEURONTIN Take 2 capsules (600 mg total) by mouth 3 (three) times daily.   HYDROcodone-acetaminophen 5-325 MG tablet Commonly known as:  NORCO/VICODIN Take 1-2 tablets by mouth every 6 (six) hours as needed for  severe pain.   Insulin Detemir 100 UNIT/ML Pen Commonly known as:  LEVEMIR FLEXTOUCH Inject 20 Units into the skin daily at 10 pm. What changed:  how much to take  when to take this   lidocaine 2 % jelly Commonly known as:  XYLOCAINE APPLY TO AFFECTED AREA THREE TIMES A DAY What changed:  See the new instructions.   NOVOLOG 100 UNIT/ML injection Generic drug:  insulin aspart USE TO FILL VGO40 TO WEAR DAILY. What changed:  See the new instructions.   nystatin cream Commonly known as:  MYCOSTATIN APPLY TOPICALLY TWO TIMES DAILY. What changed:  how much to  take  how to take this  when to take this  reasons to take this  additional instructions   omeprazole 40 MG capsule Commonly known as:  PRILOSEC Take 1 capsule (40 mg total) by mouth 2 (two) times daily.   ONETOUCH VERIO test strip Generic drug:  glucose blood Check blood sugar up to 4 times a day   ONETOUCH VERIO w/Device Kit 1 each by Does not apply route 4 (four) times daily.   potassium chloride SA 20 MEQ tablet Commonly known as:  K-DUR,KLOR-CON TAKE 1 TABLET BY MOUTH TWICE DAILY What changed:  See the new instructions.   promethazine 12.5 MG tablet Commonly known as:  PHENERGAN Take 1 tablet (12.5 mg total) by mouth every 8 (eight) hours as needed for nausea or vomiting.   rosuvastatin 20 MG tablet Commonly known as:  CRESTOR TAKE 1 TABLET BY MOUTH EVERY NIGHT AT BEDTIME   SPIRIVA HANDIHALER 18 MCG inhalation capsule Generic drug:  tiotropium INHALE THE CONTENTS OF 1 CAPSULE VIA HANDIHALER BY MOUTH EVERY DAY   V-GO 40 Kit FILL AND PLACE VGO40 ABOUT THE SAME TIME EACH DAY. USE 4 TO 5 CLICKS WITH MEALS AND 2 CLICKS WITH SNACKS       Disposition and follow-up:   [name] was discharged from [facility] in Good condition.  At the hospital follow up visit please address:  1.  Any ongoing melena or recurrent GI bleed.  2.  Labs / imaging needed at time of follow-up: CBC  3.  Pending labs/ test needing follow-up: None  Follow-up Appointments:   Hospital Course by problem list:  [name] a 66 y.o.femaleon chronic anticoagulation for PAF and baseline AoCD who presents with acute onset of abd pain and dark stools found to be anemic to Hgb 6.3.  Iron deficiency anemia due to GI bleed. Most likely due to her AVM found on capsule endoscopy. EGD done during this hospital visit was normal. She was given 2 units of blood, and an IV iron loading dose. She was discharged home on daily iron supplement.  UTI / with h/o  Recurrent vulvovaginal candidiasis.Her UA  looks infected.Her urine culture is growing multiple organisms, needs to be re-collected if needed. Her symptoms improved with ceftriaxone. - discharged home on Cefdinir for a total of 5 days.  PAF and s/p mitral valve repair. She had porcine valve. Her CHADVASC was 4%. After discussing with patient about the risk and benefit to remain on anticoagulation, she decided to discontinue Coumadin. She understand  the risk including stroke well. She was discharged home on aspirin 325 mg daily.  Type 2 diabetes. CBG between 117-171 over last 24 hours. -Discharged  on her home regimen.  HFpEF:Currently not working overload.According to patient she uses Lasix when necessary. -She can continue her home regimen.  COPD / OSA:no current exacerbation.  Continue home Advair and Spiriva. CPAP qHS.  Depression / Anxiety:She has been feeling very low lately.  -Continue home meds: fluoxetine and alprazolam  Chronic Pain / Fibromyalgia. -Continue home meds Norvo 5-325 1-2 tabs q6h PRN, Flexeril 61m TID PRN.  Discharge Vitals:   BP (!) 141/37   Pulse 68   Temp 98.1 F (36.7 C) (Oral)   Resp 14   Ht _0  (1.575 m)   Wt 197 lb (89.4 kg)   SpO2 91%   BMI 36.03 kg/m   Gen. Well-developed, obese lady, in no distress. Chest.Clear bilaterally. CVS. Normal rate and rhythm. Abdomen.Soft, obese, mild tenderness along the left lower quadrant,bowel sounds positive. Extremities.No edema, no cyanosis, pulses 2+ bilaterally.  Pertinent Labs, Studies, and Procedures:  CBC Latest Ref Rng & Units 03/29/2016 03/28/2016 03/28/2016  WBC 4.0 - 10.5 K/uL 8.0 - 8.4  Hemoglobin 12.0 - 15.0 g/dL 8.9(L) 8.3(L) 7.3(L)  Hematocrit 36.0 - 46.0 % 29.4(L) 27.3(L) 23.8(L)  Platelets 150 - 400 K/uL 191 - 175   BMP Latest Ref Rng & Units 03/28/2016 03/27/2016 08/23/2015  Glucose 65 - 99 mg/dL 200(H) 263(H) -  BUN 6 - 20 mg/dL 9 13 -  Creatinine 0.44 - 1.00 mg/dL 0.94 0.93  0.80  BUN/Creat Ratio 12 - 28 - - -  Sodium 135 - 145 mmol/L 139 132(L) -  Potassium 3.5 - 5.1 mmol/L 4.4 4.2 -  Chloride 101 - 111 mmol/L 105 96(L) -  CO2 22 - 32 mmol/L 26 27 -  Calcium 8.9 - 10.3 mg/dL 8.5(L) 8.5(L) -   Iron/TIBC/Ferritin/ %Sat    Component Value Date/Time   IRON 22 (L) 03/27/2016 2126   TIBC 445 03/27/2016 2126   FERRITIN 5 (L) 03/27/2016 2126   FERRITIN 29 08/08/2015 1441   IRONPCTSAT 5 (L) 03/27/2016 2126   IRONPCTSAT 10 (L) 06/13/2013 0847   Urinalysis    Component Value Date/Time   COLORURINE YELLOW 03/27/2016 2117   APPEARANCEUR CLOUDY (A) 03/27/2016 2117   LABSPEC 1.006 03/27/2016 2117   PHURINE 7.0 03/27/2016 2117   GLUCOSEU >=500 (A) 03/27/2016 2117   GLUCOSEU 100 (A) 03/08/2010 2019   HGBUR SMALL (A) 03/27/2016 2117   HGBUR small 11/15/2008 0915   BILIRUBINUR NEGATIVE 03/27/2016 2117   [place]Mad[place]NEGATIVE 03/27/2016 2117   PROTEINUR NEGATIVE 03/27/2016 2117   UROBILINOGEN 0.2 10/25/2014 1142   NITRITE NEGATIVE 03/27/2016 2117   LEUKOCYTESUR LARGE (A) 03/27/2016 2117   Haptoglobin. 185 LDH. 133  CT abdomen and pelvis. FINDINGS: Lower chest: A bleb is noted at the right lung base. Scattered calcification is noted at the aortic and mitral valves, with postoperative change at the mitral valve. Scattered coronary artery calcifications are seen.  Hepatobiliary: The liver is unremarkable in appearance. The gallbladder is unremarkable in appearance. The common bile duct remains normal in caliber. Trace fluid is seen about the liver.  Pancreas: The pancreas is within normal limits.  Spleen: The spleen is unremarkable in appearance.  Adrenals/Urinary Tract: The adrenal glands are unremarkable in appearance.  Post ablation changes at the posterior aspect of the lower pole of the left kidney are grossly unchanged from 2017. There is no evidence of recurrent mass, with a mixture fatty attenuation and soft tissue attenuation. Mild  delayed enhancement at the small 1.2 cm soft tissue component along the posterior aspect of the ablation area is stable in appearance.  A 6 mm nonobstructing stone is noted at the interpole region of the right kidney. Mild nonspecific perinephric stranding is noted  bilaterally. There is no evidence of hydronephrosis. No obstructing ureteral stones are identified. No perinephric stranding is seen.  Stomach/Bowel: Visualized small and large bowel loops are grossly unremarkable in appearance. The stomach is largely decompressed and unremarkable.  Vascular/Lymphatic: Diffuse calcification is seen along the abdominal aorta and its branches. The abdominal aorta is otherwise grossly unremarkable. The inferior vena cava is grossly unremarkable. No retroperitoneal lymphadenopathy is seen.  Other: There is question of mild soft tissue inflammation at the right lower quadrant. Would correlate clinically for any evidence of appendicitis, as the appendix is not imaged on this study.  Musculoskeletal: No acute osseous abnormalities are identified. The visualized musculature is unremarkable in appearance.  IMPRESSION: 1. Stable appearance to post ablation change at the lower pole of the left kidney, with a small 1.2 cm focus of enhancing soft tissue again noted along the posterior aspect of the region, thought to reflect enhancing scar tissue. No evidence of local recurrence. 2. Question of mild soft tissue inflammation at the right lower quadrant, incompletely imaged on this study. Would correlate clinically for any evidence of appendicitis, as the appendix is not imaged on this study. 3. Diffuse aortic atherosclerosis. 4. 6 mm nonobstructing stone at the interpole region of the right kidney. 5. Bleb at the right lung base. Scattered calcification at the aortic and mitral valves. Scattered coronary artery calcifications Seen.  EGD. Findings:      The examined esophagus was normal  except some debris versus mild candida. The entire examined stomach was normal. The duodenal bulb, first portion of the duodenum, second portion of the duodenum and third portion of the duodenum were normal. The exam was otherwise without abnormality. Impression:               - Normal esophagus.                           - Normal stomach.                           - Normal duodenal bulb, first portion of the duodenum, second portion of the duodenum and third portion of the duodenum. - The examination was otherwise normal. - No specimens collected.   Discharge Instructions: Discharge Instructions    Diet - low sodium heart healthy    Complete by:  As directed    Discharge instructions    Complete by:  As directed    It was pleasure taking care of you. Please complete all of your antibiotics as directed. Please call and make an appointment with your primary care for hospital follow-up. Please start taking your iron supplement daily. We are stopping your Coumadin as Dr. Eppie Gibson discussed with you, we are starting you on aspirin 325 mg daily.   Increase activity slowly    Complete by:  As directed       Signed: Lorella Nimrod, MD 03/30/2016, 2:13 PM   Pager: [phone]

## 2016-03-29 NOTE — Progress Notes (Signed)
Internal Medicine Attending  Date: 03/29/2016  Patient name: [name] Medical record number: [id number] Date of birth: 1950/11/17 Age: 66 y.o. Gender: female  I saw and evaluated the patient. I reviewed the resident's note by Dr. Reesa Chew and I agree with the resident's findings and plans as documented in her progress note.  When seen on rounds this morning after her EGD [name] was without acute complaints. My abdominal examination was without pain or rebound. The EGD was unremarkable and it is felt that her bleeding is related to her small bowel AVMs. We had a long discussion about the risks and benefits of anticoagulation with Coumadin for her paroxysmal atrial fibrillation given the recurrent slow bleeding and requirements for readmission. She decided to stop the Coumadin and start aspirin 325 mg by mouth daily. She realizes the risks for stroke are greater on the aspirin than the Coumadin, but she is tiring of the recurrent admissions for anemia. She was also given IV iron in order to boost her iron stores and will be discharged on daily iron. She will [facility].

## 2016-03-30 ENCOUNTER — Encounter (HOSPITAL_COMMUNITY): Payer: Self-pay | Admitting: Gastroenterology

## 2016-03-30 LAB — HAPTOGLOBIN: Haptoglobin: 185 mg/dL (ref 34–200)

## 2016-03-31 ENCOUNTER — Telehealth: Payer: Self-pay

## 2016-03-31 NOTE — Telephone Encounter (Signed)
Pharmacy needs clarification on Cefdinir - take 1 tab 2 times daily but qty is for 7 caps. Thanks

## 2016-03-31 NOTE — Telephone Encounter (Signed)
Chressa from [facility] needs to speak with a nurse about cefdinir (OMNICEF) 300 MG capsule. Please call back.

## 2016-03-31 NOTE — Telephone Encounter (Signed)
7 Capsules were for 3.5 more days.

## 2016-03-31 NOTE — Telephone Encounter (Signed)
The decision to treat her UTI was made on the inpatient service.  I was not involved in this decision and do not know the clinical circumstances that resulted in this decision.  I will therefore defer to Drs. Hoy Register, and [place] for this particular issue.

## 2016-03-31 NOTE — Telephone Encounter (Signed)
[facility] back - but stated pt had called to informed them she had rx filled at a local pharmacy.

## 2016-04-01 ENCOUNTER — Other Ambulatory Visit: Payer: Self-pay

## 2016-04-01 ENCOUNTER — Other Ambulatory Visit: Payer: Self-pay | Admitting: Internal Medicine

## 2016-04-01 DIAGNOSIS — B373 Candidiasis of vulva and vagina: Secondary | ICD-10-CM

## 2016-04-01 DIAGNOSIS — B3731 Acute candidiasis of vulva and vagina: Secondary | ICD-10-CM

## 2016-04-04 NOTE — Patient Outreach (Signed)
[facility]) Care Management  04/04/2016   [name] 27-Nov-1950 [id number]  Subjective:  I am glad to be out of the hospital  Objective:  Telephonic contact for transition of care  Current Medications:  Current Outpatient Prescriptions  Medication Sig Dispense Refill  . albuterol (PROAIR HFA) 108 (90 Base) MCG/ACT inhaler Inhale 1-2 puffs into the lungs every 6 (six) hours as needed for shortness of breath. 8.5 each 11  . ALPRAZolam (XANAX) 1 MG tablet Take 0.5-1 tablets (0.5-1 mg total) by mouth 2 (two) times daily as needed for anxiety. 65 tablet 5  . aspirin EC 325 MG tablet Take 1 tablet (325 mg total) by mouth daily. 30 tablet 0  . Blood Glucose Monitoring Suppl (ONETOUCH VERIO) W/DEVICE KIT 1 each by Does not apply route 4 (four) times daily. 1 kit 0  . cefdinir (OMNICEF) 300 MG capsule Take 1 capsule (300 mg total) by mouth 2 (two) times daily. 7 capsule 0  . chlorpheniramine (CHLOR-TRIMETON) 4 MG tablet Take 1 tablet (4 mg total) by mouth 3 (three) times daily as needed for allergies. 90 tablet 11  . cyclobenzaprine (FLEXERIL) 5 MG tablet Take 1 tablet (5 mg total) by mouth 3 (three) times daily as needed for muscle spasms. 90 tablet 3  . EASY TOUCH PEN NEEDLES 31G X 5 MM MISC USE TO INJECT INSULIN 5 TIMES DAILY 200 each 8  . ferrous gluconate (FERGON) 324 MG tablet Take 1 tablet (324 mg total) by mouth 2 (two) times daily with a meal. (Patient not taking: Reported on 03/27/2016) 180 tablet 3  . FLUoxetine (PROZAC) 20 MG tablet Take 2 tablets (40 mg total) by mouth daily. 180 tablet 3  . fluticasone (FLONASE) 50 MCG/ACT nasal spray Place 2 sprays into both nostrils daily. 16 g 3  . Fluticasone-Salmeterol (ADVAIR DISKUS) 500-50 MCG/DOSE AEPB Inhale 1 puff into the lungs 2 (two) times daily. 180 each 3  . furosemide (LASIX) 80 MG tablet Take 1 tablet (80 mg total) by mouth 2 (two) times daily. (Patient taking differently: Take 80 mg by mouth 2 (two) times  daily as needed for fluid. ) 180 tablet 3  . gabapentin (NEURONTIN) 300 MG capsule Take 2 capsules (600 mg total) by mouth 3 (three) times daily. 540 capsule 3  . HYDROcodone-acetaminophen (NORCO/VICODIN) 5-325 MG tablet Take 1-2 tablets by mouth every 6 (six) hours as needed for severe pain. 150 tablet 0  . Insulin Detemir (LEVEMIR FLEXTOUCH) 100 UNIT/ML Pen Inject 20 Units into the skin daily at 10 pm. (Patient taking differently: Inject 40 Units into the skin daily. ) 15 mL 5  . Insulin Disposable Pump (V-GO 40) KIT FILL AND PLACE VGO40 ABOUT THE SAME TIME EACH DAY. USE 4 TO 5 CLICKS WITH MEALS AND 2 CLICKS WITH SNACKS (Patient not taking: Reported on 03/27/2016) 30 kit 5  . Lancets Misc. (ACCU-CHEK FASTCLIX LANCET) KIT Check your blood 4 times a day dx code 250.00 insulin requiring 1 kit 2  . lidocaine (XYLOCAINE) 2 % jelly APPLY TO AFFECTED AREA THREE TIMES A DAY (Patient taking differently: APPLY TO AFFECTED AREA THREE TIMES A DAY AS NEEDED FOR PAIN) 30 mL 10  . NOVOLOG 100 UNIT/ML injection USE TO FILL VGO40 TO WEAR DAILY. (Patient taking differently: 50-60 units three to four times a day as needed for sugar levels) 30 mL 5  . nystatin cream (MYCOSTATIN) APPLY TO AFFECTED AREA TWICE DAILY 30 g 2  . omeprazole (PRILOSEC) 40 MG capsule Take  1 capsule (40 mg total) by mouth 2 (two) times daily. 180 capsule 3  . ONETOUCH VERIO test strip Check blood sugar up to 4 times a day 350 each 11  . potassium chloride SA (K-DUR,KLOR-CON) 20 MEQ tablet TAKE 1 TABLET BY MOUTH TWICE DAILY (Patient taking differently: TAKE 1 TABLET BY MOUTH TWICE DAILY AS NEEDED WHEN TAKING LASIX) 180 tablet 2  . promethazine (PHENERGAN) 12.5 MG tablet Take 1 tablet (12.5 mg total) by mouth every 8 (eight) hours as needed for nausea or vomiting. 30 tablet 0  . rosuvastatin (CRESTOR) 20 MG tablet TAKE 1 TABLET BY MOUTH EVERY NIGHT AT BEDTIME 90 tablet 2  . SPIRIVA HANDIHALER 18 MCG inhalation capsule INHALE THE CONTENTS OF 1  CAPSULE VIA HANDIHALER BY MOUTH EVERY DAY 90 capsule 2   No current facility-administered medications for this visit.     Functional Status:  In your present state of health, do you have any difficulty performing the following activities: 03/04/2016 02/18/2016  Hearing? N N  Vision? N N  Difficulty concentrating or making decisions? [name]  Walking or climbing stairs? Y Y  Dressing or bathing? Y Y  Doing errands, shopping? [name]  Preparing Food and eating ? N -  Using the Toilet? N -  In the past six months, have you accidently leaked urine? N -  Do you have problems with loss of bowel control? N -  Managing your Medications? N -  Managing your Finances? Y -  Housekeeping or managing your Housekeeping? Y -  Some recent data might be hidden    Fall/Depression Screening: PHQ 2/9 Scores 03/04/2016 02/18/2016 01/11/2016 11/16/2015 09/28/2015 08/08/2015 07/12/2015  PHQ - 2 Score 1 0 _0 PHQ- 9 Score - - - - _1 [facility] CM Care Plan Problem One   Flowsheet Row Most Recent Value  Care Plan Problem One  patient has multiple chronic illnesses  Role Documenting the Problem One  Care Management Coordinator  Care Plan for Problem One  Active  [facility] Long Term Goal (31-90 days)  In the next 31 days, member will meet with [facility] RNCM for assessment of chronic disease educational needs  [facility] Long Term Goal Start Date  04/01/16  Interventions for Problem One Long Term Goal  Transition of care call to assess needs  [facility] CM Short Term Goal #1 (0-30 days)  In the next 28 days, patient will meet with [facility] LCSW for psycosocial assessment   [facility] CM Short Term Goal #1 Start Date  02/26/16  [facility] CM Short Term Goal #1 Met Date  03/07/16  Interventions for Short Term Goal #1  contact made with LCSW for initation of contact  to arrange telephone contact in the nearfuture    [facility] CM Care Plan Problem Two   Flowsheet Row Most Recent Value  Care Plan Problem Two  Patient suffers from anxiety and depression.  Role  Documenting the Problem Two  Clinical Social Worker  Care Plan for Problem Two  Active  [facility] CM Short Term Goal #1 (0-30 days)  Patient is interested in receiving counseling and supportive services for depression and anxiety, within the next two weeks.  [facility] CM Short Term Goal #1 Start Date  03/07/16  [facility] CM Short Term Goal #1 Met Date   03/14/16  Interventions for Short Term Goal #2   CSW will mail patient a packet of resource information, providing a list of counselors/therapists that are in network  with her insurance.    [facility] CM Care Plan Problem Three   Flowsheet Row Most Recent Value  Care Plan Problem Three  Patient had recent hospitalization for GI bleed  Role Documenting the Problem Three  Care Management Coordinator  Care Plan for Problem Three  Active  [facility] Long Term Goal (31-90) days  In the next 31 days, patient will have no GI bleed related acute care admissions  [facility] Long Term Goal Start Date  04/01/16  Interventions for Problem Three Long Term Goal  transitions of care call completed.       Assessment:  This patient was recently released from the acute care setting after experiencing a GI Bleed. Patient has multiple chronic illnesses with a long history of non-compliance [facility] consult ordered for medication education, reconciliation.  Plan:  Home visit scheduled for within the next 21 days.

## 2016-04-06 ENCOUNTER — Telehealth: Payer: Self-pay | Admitting: Internal Medicine

## 2016-04-06 NOTE — Telephone Encounter (Signed)
   Reason for call:   I received a call from [name] at 5:01 PM indicating that she is having cramping pain in both legs.   Pertinent Data:   Patient reports a cramping pain in both legs that feels "deep inside" which began a few days ago.  Pain is more noticeable when she wakes in the morning. Has used a heating pad and Flexeril with some relief.  Of note, patient was recently admitted for a GI bleed from 2/15-2/17. After risks/benefits discussion, she opted to discontinue her Coumadin for paroxysmal atrial fibrillation and start Aspirin 325 mg daily.  She has not noticed any calf swelling, tenderness, change in color, chest pain, SOB, palpitations.   Assessment / Plan / Recommendations:   Patient with cramping type pain in both legs. As she is off anticoagulation, DVT is in the differential although this would be unusual to occur in both legs simultaneously. Cramping from dehydration, claudication, neuropathic pain, or restless leg syndrome are also in the differential.  Recommend patient continue heating pad and home pain medicine/muscle relaxers as needed and follow up in [facility] for further evaluation.  Patient does have a hospital follow up appointment on 2/27 in [facility] which she will keep. She will also consider moving this up to 2/26 if needed.  As always, pt is advised that if symptoms worsen or new symptoms arise, they should go to an urgent care facility or to to ER for further evaluation.   [name], MD   04/06/2016, 8:54 PM

## 2016-04-07 ENCOUNTER — Telehealth: Payer: Self-pay | Admitting: Internal Medicine

## 2016-04-07 NOTE — Telephone Encounter (Signed)
APT. REMINDER CALL, LMTCB °

## 2016-04-08 ENCOUNTER — Ambulatory Visit: Payer: PPO

## 2016-04-09 ENCOUNTER — Other Ambulatory Visit (HOSPITAL_COMMUNITY): Payer: PPO

## 2016-04-09 ENCOUNTER — Encounter (HOSPITAL_COMMUNITY): Payer: Self-pay

## 2016-04-09 ENCOUNTER — Ambulatory Visit
Admission: RE | Admit: 2016-04-09 | Discharge: 2016-04-09 | Disposition: A | Discharge status: Home / Self Care | Payer: PPO | Source: Ambulatory Visit | Attending: Interventional Radiology | Admitting: Interventional Radiology

## 2016-04-09 ENCOUNTER — Telehealth: Payer: Self-pay | Admitting: Pharmacist

## 2016-04-09 DIAGNOSIS — C642 Malignant neoplasm of left kidney, except renal pelvis: Secondary | ICD-10-CM

## 2016-04-09 DIAGNOSIS — G4733 Obstructive sleep apnea (adult) (pediatric): Secondary | ICD-10-CM | POA: Diagnosis not present

## 2016-04-09 DIAGNOSIS — C220 Liver cell carcinoma: Secondary | ICD-10-CM | POA: Diagnosis not present

## 2016-04-09 HISTORY — PX: IR GENERIC HISTORICAL: IMG1180011

## 2016-04-09 NOTE — Telephone Encounter (Signed)
-----  Message from Gaynelle Adu, [facility] sent at 04/07/2016  2:59 PM EST ----- Regarding: FW: Referral: Order for [facility]   ----- Message ----- From: Karie Kirks [place] Sent: 04/04/2016   1:19 PM To: Tobi Bastos, RN, [place], [facility] Subject: Referral: Order for [name], [name] ----- Message ----- From: Tobi Bastos, RN Sent: 04/04/2016  12:27 PM To: [facility] Cm Communication Orders Subject: Order for [name]                      Patient Name: [name], [name]([id number]) Sex: Female DOB: 12-05-1950    PCP: KLIMA, Orlovista   Types of orders made on 04/04/2016: Nursing  Order Date:04/04/2016 Ordering User:FAULKNER, Peterson Ao [[id number]] Encounter [place], RN [phone] Authorizing Provider: [name], MD [[id number]] Department:[facility]COMM[facility][[id number]]  Order Specific Information Order: Comm to Pharmacy [Custom: [id number]]  Order #: [id number] Qty: 1   Priority: Routine  Class: Clinic Performed     Priority: Routine  Class: Clinic Performed    Scheduling Instructions:   [facility] consult for medication education, reconciliation.

## 2016-04-09 NOTE — Progress Notes (Signed)
Referring Physician(s): Dr Alan Ripper  Chief Complaint: The patient is seen in follow up today s/p left renal clear cell carcinoma cryoablation 09/19/11   History of present illness:  Doing well really Was hospitalized few weeks ago with anemia Told she has GI Bleed but all studies negative per pt. Also treated for UTI while in house.  Denies pain; N/V Denies fever Urinating well; clear yellow  CT performed 2/15 /18 while in hospital did coincide with 6 mo follow up need for cryoablation: IMPRESSION: 1. Stable appearance to post ablation change at the lower pole of the left kidney, with a small 1.2 cm focus of enhancing soft tissue again noted along the posterior aspect of the region, thought to reflect enhancing scar tissue. No evidence of local recurrence. 2. Question of mild soft tissue inflammation at the right lower quadrant, incompletely imaged on this study. Would correlate clinically for any evidence of appendicitis, as the appendix is not imaged on this study. 3. Diffuse aortic atherosclerosis. 4. 6 mm nonobstructing stone at the interpole region of the right kidney. 5. Bleb at the right lung base. Scattered calcification at the aortic and mitral valves. Scattered coronary artery calcifications seen.  Seen today for follow up of left renal mass cryoablation  Past Medical History:  Diagnosis Date  . Adenomatous polyps 05/14/2011   Colonoscopy (05/2011): 4 mm adenomatous polyp excised endoscopically Colonoscopy (02/2002): Adenomatous polyp excised endoscopically   . Allergic rhinitis 06/01/2012  . Anemia of chronic disease 01/01/2013  . Anxiety 07/24/2010  . Aortic atherosclerosis ([place]) 10/19/2014   Seen on CT scan, currently asymptomatic  . 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 Asymptomatic cholelithiasis 09/25/2015   Seen on CT scan 08/2015  . Bilateral cataracts 08/04/2012   Visually insignificant   . Carotid artery stenosis    s/p right endarterectomy (06/2010) Carotid US (07/2010):  Left: Moderate-to-severe (60-79%) calcific and non-calcific plaque origin and proximal ICA and ECA   . Chronic congestive heart failure with left ventricular diastolic dysfunction ([place]) 10/21/2010  . Chronic constipation 02/03/2011  . Chronic daily headache 01/16/2014  . Chronic low back pain 10/06/2012  . Chronic venous insufficiency 08/04/2012  . Clear cell renal cell carcinoma ([place]) 07/21/2011   s/p cryoablation of left RCC in 09/2011 by [name]. Followed by [name]  [facility]) .    Marland Kitchen COPD (chronic obstructive pulmonary disease) with emphysema (HCC)    PFTs 02/2012: FEV1 0.92 (40%), ratio 69, 27% increase in FEV1 with BD, TLC 91%, severe airtrapping, DLCO49% On chronic home O2. Pulmonary rehab referral 05/2012   . Depression 11/19/2005  . Esophageal stenosis 08/08/2015   Mild, benign-appearing on EGD 06/29/2015  . Fibromyalgia 08/29/2010  . Gastroesophageal reflux disease   . History of blood transfusion    "several times"   . Hyperlipidemia LDL goal < 100 11/20/2005  . Internal hemorrhoids 08/04/2012  . Mitral stenosis    s/p Mitral valve replacement with a 27-mm pericardial porcine valve (Medtronic Mosaic valve, serial #[id number] on 09/20/10, Dr. Prescott Gum)   . Moderate to severe pulmonary hypertension    2014 TEE w PA peak pressure 46 mmHg, s/p MV replacement   . Obesity (BMI 30.0-34.9) 10/23/2011  . Obstructive sleep apnea    Nocturnal polysomnography (06/2009): Moderate sleep apnea/ hypopnea syndrome , AHI 17.8 per hour with nonpositional hypopneas. CPAP titration to 12 CWP, AHI 2.4 per hour. On nocturnal CPAP via a small resMed  Quattro full-face mask with heated humidifier.   . Osteoporosis    DEXA (12/09/2011): L-spine T -3.7, left hip T -1.4 DEXA (12/2004): L-spine T -2.6, left hip -0.1   . Paroxysmal atrial fibrillation ([place]) 10/22/2010   s/p Left atrial  maze procedure for paroxysmal atrial fibrillation on 09/20/2010 by Dr Prescott Gum.  Subsequent splenic infarct, decision was made to re-anticoagulate with coumadin, likely life-long as this is the most likely cause of the splenic infarct.   . Right nephrolithiasis 09/06/2014   5 mm non-obstructing calculus seen on CT scan 09/05/2014   . Seborrheic keratosis 09/28/2015  . Shortness of breath dyspnea   . Tobacco abuse 07/28/2012  . Type 2 diabetes mellitus with diabetic neuropathy [facility])     Past Surgical History:  Procedure Laterality Date  . CARDIAC VALVE REPLACEMENT  Aug. 2012   "mitral valve"  . CAROTID ENDARTERECTOMY Right 07/04/2010   by [name] for asymptomatic right carotid artery stenosis  . CHEST TUBE INSERTION  09/24/2010   Dr Prescott Gum  . COLONOSCOPY  05/12/2011   performed by Dr. Michail Sermon. Showing small internal hemorrhoids, single tubular adenoma polyp  . CRYOABLATION Left 09/2011   by [name]. Followed by [name]  [facility]) .    Marland Kitchen DILATION AND CURETTAGE OF UTERUS    . ESOPHAGOGASTRODUODENOSCOPY  05/12/2011   performed by Dr. Michail Sermon. Negative for ulcerations, biopsy negative for evidence of celiac sprue  . ESOPHAGOGASTRODUODENOSCOPY N/A 06/29/2015   Procedure: ESOPHAGOGASTRODUODENOSCOPY (EGD);  Surgeon: Clarene Essex, MD;  Location: [facility] ENDOSCOPY;  Service: Endoscopy;  Laterality: N/A;  . ESOPHAGOGASTRODUODENOSCOPY N/A 03/29/2016   Procedure: ESOPHAGOGASTRODUODENOSCOPY (EGD);  Surgeon: Clarene Essex, MD;  Location: [facility] ENDOSCOPY;  Service: Endoscopy;  Laterality: N/A;  . FRACTURE SURGERY Right 2005   clavicle  . GIVENS CAPSULE STUDY N/A 06/30/2015   Procedure: GIVENS CAPSULE STUDY;  Surgeon: Clarene Essex, MD;  Location: [facility] ENDOSCOPY;  Service: Endoscopy;  Laterality: N/A;  . GIVENS CAPSULE STUDY N/A 06/29/2015   Procedure: GIVENS CAPSULE STUDY;  Surgeon: Clarene Essex, MD;  Location: [place];  Service: Endoscopy;  Laterality: N/A;  . Hysteroscopy with endometrial  ablation  06/2001   for persistent post-menopausal bleeding // by S. [name], M.D.  . IR GENERIC HISTORICAL  08/23/2015   IR RADIOLOGIST EVAL & MGMT 08/23/2015 Aletta Edouard, MD [facility] INTERV RAD  . LIPOMA EXCISION  08/2005   occipital lipoma 1.5cm - by Dr. [name]  . MAZE Left 09/20/10   for paroxysmal atrial fibrillation (Dr. Prescott Gum)  . MITRAL VALVE REPLACEMENT  09/20/10    with a 27-mm pericardial porcine valve (Medtronic Mosaic valve, serial #[id number]). 09/20/10, Dr Prescott Gum  . ORIF CLAVICLE FRACTURE  01/2004   by Thana Farr. [name], M.D for Right clavicle nonunion.  . TONSILLECTOMY    . TUBAL LIGATION      Allergies: Morphine and related; Oxycontin [oxycodone]; Tramadol hcl; and Lorazepam  Medications: Prior to Admission medications   Medication Sig Start Date End Date Taking? Authorizing Provider  albuterol (PROAIR HFA) 108 (90 Base) MCG/ACT inhaler Inhale 1-2 puffs into the lungs every 6 (six) hours as needed for shortness of breath. 11/14/15  Yes [name], MD  ALPRAZolam [name]) 1 MG tablet Take 0.5-1 tablets (0.5-1 mg total) by mouth 2 (two) times daily as needed for anxiety. 02/13/16  Yes [name], MD  aspirin EC 325 MG tablet Take 1 tablet (325 mg total) by mouth daily. 03/29/16  Yes Lorella Nimrod, MD  Blood Glucose Monitoring  Suppl (ONETOUCH VERIO) W/DEVICE KIT 1 each by Does not apply route 4 (four) times daily. 07/25/14  Yes [name], MD  chlorpheniramine (CHLOR-TRIMETON) 4 MG tablet Take 1 tablet (4 mg total) by mouth 3 (three) times daily as needed for allergies. 10/06/14  Yes [name], MD  cyclobenzaprine (FLEXERIL) 5 MG tablet Take 1 tablet (5 mg total) by mouth 3 (three) times daily as needed for muscle spasms. 01/11/16  Yes [name], MD  EASY TOUCH PEN NEEDLES 31G X 5 MM MISC USE TO INJECT INSULIN 5 TIMES DAILY 07/26/15  Yes [name], MD  ferrous gluconate (FERGON) 324 MG tablet Take 1 tablet (324 mg total) by mouth 2 (two) times daily with  a meal. 11/16/15  Yes [name], MD  FLUoxetine (PROZAC) 20 MG tablet Take 2 tablets (40 mg total) by mouth daily. 12/14/15  Yes [name], MD  fluticasone (FLONASE) 50 MCG/ACT nasal spray Place 2 sprays into both nostrils daily. 02/08/15  Yes [name], MD  Fluticasone-Salmeterol (ADVAIR DISKUS) 500-50 MCG/DOSE AEPB Inhale 1 puff into the lungs 2 (two) times daily. 12/14/15  Yes [name], MD  furosemide (LASIX) 80 MG tablet Take 1 tablet (80 mg total) by mouth 2 (two) times daily. Patient taking differently: Take 80 mg by mouth 2 (two) times daily as needed for fluid.  06/28/15  Yes [name], MD  gabapentin (NEURONTIN) 300 MG capsule Take 2 capsules (600 mg total) by mouth 3 (three) times daily. 11/16/15  Yes [name], MD  HYDROcodone-acetaminophen (NORCO/VICODIN) 5-325 MG tablet Take 1-2 tablets by mouth every 6 (six) hours as needed for severe pain. 11/16/15  Yes [name], MD  Insulin Detemir (LEVEMIR FLEXTOUCH) 100 UNIT/ML Pen Inject 20 Units into the skin daily at 10 pm. Patient taking differently: Inject 40 Units into the skin daily.  02/13/16  Yes [name], MD  Insulin Disposable Pump (V-GO 40) KIT FILL AND PLACE VGO40 ABOUT THE SAME TIME EACH DAY. USE 4 TO 5 CLICKS WITH MEALS AND 2 CLICKS WITH SNACKS 08/08/34  Yes [name], MD  Lancets Misc. (ACCU-CHEK FASTCLIX LANCET) KIT Check your blood 4 times a day dx code 250.00 insulin requiring 01/19/13  Yes [name], MD  lidocaine (XYLOCAINE) 2 % jelly APPLY TO AFFECTED AREA THREE TIMES A DAY Patient taking differently: APPLY TO AFFECTED AREA THREE TIMES A DAY AS NEEDED FOR PAIN 09/19/15  Yes Sid Falcon, MD  NOVOLOG 100 UNIT/ML injection USE TO FILL VGO40 TO WEAR DAILY. Patient taking differently: 50-60 units three to four times a day as needed for sugar levels 03/04/16  Yes [name], MD  nystatin cream (MYCOSTATIN) APPLY TO AFFECTED AREA TWICE DAILY 04/03/16  Yes [name], MD  omeprazole  (PRILOSEC) 40 MG capsule Take 1 capsule (40 mg total) by mouth 2 (two) times daily. 08/22/15  Yes [name], MD  [facility] VERIO test strip Check blood sugar up to 4 times a day 02/28/15  Yes [name], MD  potassium chloride SA (K-DUR,KLOR-CON) 20 MEQ tablet TAKE 1 TABLET BY MOUTH TWICE DAILY Patient taking differently: TAKE 1 TABLET BY MOUTH TWICE DAILY AS NEEDED WHEN TAKING LASIX 09/19/15  Yes Sid Falcon, MD  promethazine (PHENERGAN) 12.5 MG tablet Take 1 tablet (12.5 mg total) by mouth every 8 (eight) hours as needed for nausea or vomiting. 07/13/15  Yes Alex Ronnie Derby, DO  rosuvastatin (CRESTOR) 20 MG tablet TAKE 1 TABLET BY MOUTH EVERY NIGHT AT BEDTIME 09/19/15  Yes Raquel Sarna B  Daryll Drown, MD  SPIRIVA HANDIHALER 18 MCG inhalation capsule INHALE THE CONTENTS OF 1 CAPSULE VIA HANDIHALER BY MOUTH EVERY DAY 09/19/15  Yes Sid Falcon, MD  cefdinir (OMNICEF) 300 MG capsule Take 1 capsule (300 mg total) by mouth 2 (two) times daily. Patient not taking: Reported on 04/09/2016 03/29/16   Lorella Nimrod, MD     Family History  Problem Relation Age of Onset  . Peptic Ulcer Disease Father   . Heart attack Father 81    Died of MI at age 33  . Heart attack Brother 26    Died of MI at age 90  . Obesity Brother   . Pneumonia Mother   . Healthy Sister   . Lupus Daughter   . Obsessive Compulsive Disorder Daughter     Social History   Social History  . Marital status: Divorced    Spouse name: N/A  . Number of children: N/A  . Years of education: N/A   Social History Main Topics  . Smoking status: Current Every Day Smoker    Packs/day: 1.00    Years: 53.00    Types: Cigarettes    Start date: 11/01/1962  . Smokeless tobacco: Never Used     Comment: 1/2 to 1 pack per day  . Alcohol use No  . Drug use: No  . Sexual activity: No   Other Topics Concern  . Not on file   Social History Narrative   Lives alone in [place] ([place] with 60 pound chow   Worked at [employer] for 18  years   No car     Vital Signs: BP (!) 134/53 (BP Location: Left Arm, Patient Position: Sitting, Cuff Size: Normal)   Pulse 70   Temp 98.2 F (36.8 C) (Oral)   Resp 16   Ht _0  (1.575 m)   Wt 194 lb (88 kg)   SpO2 98% Comment: O2 at 1L/nasal cannula  BMI 35.48 kg/m   Physical Exam  Constitutional: She is oriented to person, place, and time. She appears well-nourished.  Cardiovascular: Normal rate, regular rhythm and normal heart sounds.   Pulmonary/Chest: Effort normal. She has wheezes.  Abdominal: Soft. Bowel sounds are normal.  Musculoskeletal: Normal range of motion.  Neurological: She is alert and oriented to person, place, and time.  Skin: Skin is warm and dry.  Psychiatric: She has a normal mood and affect. Her behavior is normal.  Nursing note and vitals reviewed.   Imaging: No results found.  Labs:  CBC:  Recent Labs  03/27/16 1645 03/28/16 0414 03/28/16 1052 03/28/16 2030 03/29/16 0554  WBC 12.0* 9.2 8.4  --  8.0  HGB 6.3* 7.4* 7.3* 8.3* 8.9*  HCT 21.1* 24.6* 23.8* 27.3* 29.4*  PLT 193 184 175  --  191    COAGS:  Recent Labs  01/11/16 1227 02/18/16 1204 03/27/16 1757 03/29/16 0554  INR 1.4 3.10 2.54 2.26    BMP:  Recent Labs  07/03/15 0258 07/12/15 1128 08/23/15 1325 03/27/16 1645 03/28/16 0414  NA 138 144  --  132* 139  K 3.1* 4.2  --  4.2 4.4  CL 98* 105  --  96* 105  CO2 32 22  --  27 26  GLUCOSE 230* 144*  --  263* 200*  BUN 8 12  --  13 9  CALCIUM 8.6* 9.0  --  8.5* 8.5*  CREATININE 0.83 0.66 0.80 0.93 0.94  GFRNONAA >60 92  --  >60 >60  GFRAA >  60 106  --  >60 >60    LIVER FUNCTION TESTS:  Recent Labs  06/28/15 0241 03/27/16 1645  BILITOT 1.5* 0.3  AST 79* 17  ALT 72* 12*  ALKPHOS 84 41  PROT 5.6* 6.2*  ALBUMIN 2.7* 3.3*    Assessment:  Left renal clear cell carcinoma cryoablation 09/19/2011 Dr Kathlene Cote has seen and examined pt---he has reviewed recent  imaging with pt and answered all questions to  satisfaction. Plan for CT 09/2016 She is agreeable  Signed: TURPIN,PAMELA A 04/09/2016, 1:41 PM   Please refer to [name] attestation of this note for management and plan.

## 2016-04-09 NOTE — Patient Outreach (Signed)
[facility]) Care Management  04/09/2016  [name] 12-06-1950 [id number]  Patient was called regarding medication management as per referral from [place], Loni Muse. No answer.   HIPAA compliant message was left on the patient's voicemail.   Plan:  I will call the patient back within the next 3-5 business days.  Elayne Guerin, PharmD, [place] Clinical Pharmacist [phone]

## 2016-04-10 ENCOUNTER — Encounter: Payer: Self-pay | Admitting: Pharmacist

## 2016-04-10 ENCOUNTER — Telehealth: Payer: Self-pay | Admitting: Pharmacist

## 2016-04-10 ENCOUNTER — Other Ambulatory Visit: Payer: Self-pay | Admitting: *Deleted

## 2016-04-10 ENCOUNTER — Telehealth: Payer: Self-pay

## 2016-04-10 ENCOUNTER — Other Ambulatory Visit: Payer: Self-pay

## 2016-04-10 NOTE — Patient Outreach (Signed)
[facility] ([facility]) Care Management  04/10/2016  [name] 08/06/1950 [id number]   CSW was able to make contact with patient today to follow-up regarding social work services and resources, as well as to assess need for continued social work involvement.  Patient admitted to feeling "like an old dishrag", weak and fatigued, not fully regaining her strength and energy back from being hospitalized last week.  Patient indicated that she was diagnosed with Anemia, needing to receive two units of blood.  Patient went on to say that she is fearful that her cancer is back, but that she is not going to "get all worked up about it".  Patient indicated that she will go for another CT Scan in 6 months to determine whether or not the cancer has grown and/or spread.  CSW offered counseling and supportive services, where appropriate. Patient indicated that she has still not had an opportunity to review the contents of the packet of resource information mailed to her by CSW.  CSW encouraged patient to do so, and then contact CSW directly if she has questions for CSW to answer; otherwise, CSW will proceed with case closure on patient, as all goals of treatment have been met from social work standpoint and no additional social work needs have been identified at this time.  Patient is not interested in CSW assisting her with a referral for counseling and/or therapeutic services. CSW will notify patient's RNCM with [facility], Pamela Faulkner of CSW's plans to close patient's case.  CSW will fax an update to patient's Primary Care Physician, Dr. Lawrence Klima to ensure that they are aware of CSW's involvement with patient's plan of care.  CSW will submit a case closure request to Nicole Robinson, Care Management Assistant with [facility], in the form of an In Basket message.  CSW will ensure that Mrs. Robinson is aware of [name]'s, RNCM with  [facility], continued involvement with patient's care. Joanna Saporito, BSW, MSW, LCSW  Licensed Clinical Social Worker  [facility]  Mailing Address-[address], [place], [place] [address] Physical Address-[address], [place], [place] [address] Toll Free Main # [phone] Fax # [phone] Cell # [phone]  Office # [phone] [email or web address]        

## 2016-04-10 NOTE — Telephone Encounter (Signed)
Asking to speak with Bonnita Nasuti. Please call back.

## 2016-04-10 NOTE — Telephone Encounter (Signed)
-----  Message from Gaynelle Adu, [facility] sent at 04/07/2016  2:59 PM EST ----- Regarding: FW: Referral: Order for [facility]   ----- Message ----- From: Karie Kirks [place] Sent: 04/04/2016   1:19 PM To: Tobi Bastos, RN, [place], [facility] Subject: Referral: Order for [name], [name] ----- Message ----- From: Tobi Bastos, RN Sent: 04/04/2016  12:27 PM To: [facility] Cm Communication Orders Subject: Order for [name]                      Patient Name: [name], [name]([id number]) Sex: Female DOB: 12-05-1950    PCP: KLIMA, Orlovista   Types of orders made on 04/04/2016: Nursing  Order Date:04/04/2016 Ordering User:FAULKNER, Peterson Ao [[id number]] Encounter [place], RN [phone] Authorizing Provider: [name], MD [[id number]] Department:[facility]COMM[facility][[id number]]  Order Specific Information Order: Comm to Pharmacy [Custom: [id number]]  Order #: [id number] Qty: 1   Priority: Routine  Class: Clinic Performed     Priority: Routine  Class: Clinic Performed    Scheduling Instructions:   [facility] consult for medication education, reconciliation.

## 2016-04-10 NOTE — Telephone Encounter (Deleted)
-----  Message from Gaynelle Adu, [facility] sent at 04/07/2016  2:59 PM EST ----- Regarding: FW: Referral: Order for [facility] A   ----- Message ----- From: Karie Kirks [place] Sent: 04/04/2016   1:19 PM To: Tobi Bastos, RN, [place], [facility] Subject: Referral: Order for [name], [name] ----- Message ----- From: Tobi Bastos, RN Sent: 04/04/2016  12:27 PM To: [facility] C[facility] Communication Orders Subject: Order for [name]                      Patient Name: [name], [name]([id number]) Sex: Female DOB: 12-05-1950    PCP: KLIMA, Orlovista   Types of orders made on 04/04/2016: Nursing  Order Date:04/04/2016 Ordering User:FAULKNER, Peterson Ao [[id number]] Encounter [place], RN [phone] Authorizing Provider: [name], MD [[id number]] Department:[facility][[id number]]  Order Specific Information Order: Comm to Pharmacy [Custom: [id number]]  Order #: [id number] Qty: 1   Priority: Routine  Class: Clinic Performed     Priority: Routine  Class: Clinic Performed    Scheduling Instructions:   [place] consult for medication education, reconciliation.

## 2016-04-10 NOTE — Patient Outreach (Signed)
[facility]) Care Management  [place]   04/10/2016  [name] 1950-10-09 [id number]  Subjective: Patient returned my call regarding medication management as per referral from [place], Erenest Rasher.  HIPAA identifiers were obtained.  Patient is a 66 year old female with multiple medical conditions, including but not limited to:  Atrial fibrillation, heart failure with preserved ejection fraction, carotid artery stenosis status post endarterectomy, diabetes, hypertension, hyperlipidemia, COPD, osteoporosis, depression/anxiety, history of renal cell carcinoma, fibromyalgia, bilateral cataracts, and chronic low back pain.  She was hospitalized 03/2016 for iron deficiency anemia due to GI bleed.  Patient reported she is handling her medications well but admits that she has a lot of old medications around her home and things that are expired or that she is no longer taking.  She does not use a pill box. Instead, she takes her meds from her medication bottles and then turns the bottle upside down when she has taken her dose for the day.  Patient agreed to have [place] home visit to help sort through her medications.  She said she has an office visit with her PCP on 05/16/16.     Objective:   Encounter Medications: Outpatient Encounter Prescriptions as of 04/10/2016  Medication Sig Note  . albuterol (PROAIR HFA) 108 (90 Base) MCG/ACT inhaler Inhale 1-2 puffs into the lungs every 6 (six) hours as needed for shortness of breath.   . ALPRAZolam (XANAX) 1 MG tablet Take 0.5-1 tablets (0.5-1 mg total) by mouth 2 (two) times daily as needed for anxiety.   Marland Kitchen aspirin EC 325 MG tablet Take 1 tablet (325 mg total) by mouth daily.   . Blood Glucose Monitoring Suppl (ONETOUCH VERIO) W/DEVICE KIT 1 each by Does not apply route 4 (four) times daily.   . chlorpheniramine (CHLOR-TRIMETON) 4 MG tablet Take 1 tablet (4 mg total) by mouth 3 (three) times daily as needed  for allergies. 10/19/2014: ....  Marland Kitchen cyclobenzaprine (FLEXERIL) 5 MG tablet Take 1 tablet (5 mg total) by mouth 3 (three) times daily as needed for muscle spasms.   Marland Kitchen EASY TOUCH PEN NEEDLES 31G X 5 MM MISC USE TO INJECT INSULIN 5 TIMES DAILY   . ferrous gluconate (FERGON) 324 MG tablet Take 1 tablet (324 mg total) by mouth 2 (two) times daily with a meal.   . FLUoxetine (PROZAC) 20 MG tablet Take 2 tablets (40 mg total) by mouth daily.   . fluticasone (FLONASE) 50 MCG/ACT nasal spray Place 2 sprays into both nostrils daily.   . Fluticasone-Salmeterol (ADVAIR DISKUS) 500-50 MCG/DOSE AEPB Inhale 1 puff into the lungs 2 (two) times daily.   . furosemide (LASIX) 80 MG tablet Take 1 tablet (80 mg total) by mouth 2 (two) times daily. (Patient taking differently: Take 80 mg by mouth 2 (two) times daily as needed for fluid. )   . gabapentin (NEURONTIN) 300 MG capsule Take 2 capsules (600 mg total) by mouth 3 (three) times daily.   Marland Kitchen HYDROcodone-acetaminophen (NORCO/VICODIN) 5-325 MG tablet Take 1-2 tablets by mouth every 6 (six) hours as needed for severe pain.   . Insulin Detemir (LEVEMIR FLEXTOUCH) 100 UNIT/ML Pen Inject 20 Units into the skin daily at 10 pm. (Patient taking differently: Inject 40 Units into the skin daily. )   . Lancets Misc. (ACCU-CHEK FASTCLIX LANCET) KIT Check your blood 4 times a day dx code 250.00 insulin requiring   . lidocaine (XYLOCAINE) 2 % jelly APPLY TO AFFECTED AREA THREE TIMES  A DAY (Patient taking differently: APPLY TO AFFECTED AREA THREE TIMES A DAY AS NEEDED FOR PAIN)   . NOVOLOG 100 UNIT/ML injection USE TO FILL VGO40 TO WEAR DAILY.   Marland Kitchen nystatin cream (MYCOSTATIN) APPLY TO AFFECTED AREA TWICE DAILY   . omeprazole (PRILOSEC) 40 MG capsule Take 1 capsule (40 mg total) by mouth 2 (two) times daily.   Glory Rosebush VERIO test strip Check blood sugar up to 4 times a day   . potassium chloride SA (K-DUR,KLOR-CON) 20 MEQ tablet TAKE 1 TABLET BY MOUTH TWICE DAILY (Patient taking  differently: TAKE 1 TABLET BY MOUTH TWICE DAILY AS NEEDED WHEN TAKING LASIX)   . promethazine (PHENERGAN) 12.5 MG tablet Take 1 tablet (12.5 mg total) by mouth every 8 (eight) hours as needed for nausea or vomiting.   . rosuvastatin (CRESTOR) 20 MG tablet TAKE 1 TABLET BY MOUTH EVERY NIGHT AT BEDTIME   . SPIRIVA HANDIHALER 18 MCG inhalation capsule INHALE THE CONTENTS OF 1 CAPSULE VIA HANDIHALER BY MOUTH EVERY DAY   . Insulin Disposable Pump (V-GO 40) KIT FILL AND PLACE VGO40 ABOUT THE SAME TIME EACH DAY. USE 4 TO 5 CLICKS WITH MEALS AND 2 CLICKS WITH SNACKS (Patient not taking: Reported on 04/10/2016)   . [DISCONTINUED] cefdinir (OMNICEF) 300 MG capsule Take 1 capsule (300 mg total) by mouth 2 (two) times daily. (Patient not taking: Reported on 04/09/2016)    No facility-administered encounter medications on file as of 04/10/2016.     Functional Status: In your present state of health, do you have any difficulty performing the following activities: 03/04/2016 02/18/2016  Hearing? N N  Vision? N N  Difficulty concentrating or making decisions? [name]  Walking or climbing stairs? Y Y  Dressing or bathing? Y Y  Doing errands, shopping? [name]  Preparing Food and eating ? N -  Using the Toilet? N -  In the past six months, have you accidently leaked urine? N -  Do you have problems with loss of bowel control? N -  Managing your Medications? N -  Managing your Finances? Y -  Housekeeping or managing your Housekeeping? Y -  Some recent data might be hidden    Fall/Depression Screening: PHQ 2/9 Scores 04/10/2016 03/04/2016 02/18/2016 01/11/2016 11/16/2015 09/28/2015 08/08/2015  PHQ - 2 Score 0 1 0 _0 PHQ- 9 Score - - - - - 8 8    Assessment:  Medications and allergies were reconciled via telephone and with the most recent hospital discharge summary.   Drugs sorted by system:  Neurologic/Psychologic: Fluoxetine Alprazolam Gabapentin  Cardiovascular: Aspirin Furosemide  (PRN)  Pulmonary/Allergy: Albuterol HFA (ProAir) Chlorpheniramine  Fluticasone Nasal Spray Advair Spiriva Rosuvastatin  Gastrointestinal: Omeprazole Promethazine (PRN)  Endocrine: Novolog with V-GO disposable pump Levemir  Renal:  Topical: Lidocaine jelly (PRN) Nystatin (PRN)  Pain: Hydrocodone/Acetaminophen Cyclobenzaprine  Vitamins/Minerals: Potassium Ferrous Gluconate      Plan:  1.  Complete home visit 04/15/16 at 10am.  2.  Send note to PCP following home visit.  Elayne Guerin, PharmD, [place] Clinical Pharmacist [phone]

## 2016-04-11 NOTE — Telephone Encounter (Signed)
Spoke w/ pt yesterday, she was reassured mostly, will work w/ donna about syringes/ needles

## 2016-04-13 NOTE — Patient Outreach (Signed)
[name] [facility]) Care Management   04/11/2016  [name] 01/24/1951 [id number]  [name] is an 66 y.o. female  Subjective:  I know I am doing okay since I have been out of the hospital, I guess.  Objective:   ROS  Elderly, well nourished lady sitting on chair in cluttered home.   Physical Exam  ROS  Encounter Medications:   Outpatient Encounter Prescriptions as of 04/10/2016  Medication Sig Note  . albuterol (PROAIR HFA) 108 (90 Base) MCG/ACT inhaler Inhale 1-2 puffs into the lungs every 6 (six) hours as needed for shortness of breath.   . ALPRAZolam (XANAX) 1 MG tablet Take 0.5-1 tablets (0.5-1 mg total) by mouth 2 (two) times daily as needed for anxiety.   Marland Kitchen aspirin EC 325 MG tablet Take 1 tablet (325 mg total) by mouth daily.   . Blood Glucose Monitoring Suppl (ONETOUCH VERIO) W/DEVICE KIT 1 each by Does not apply route 4 (four) times daily.   . chlorpheniramine (CHLOR-TRIMETON) 4 MG tablet Take 1 tablet (4 mg total) by mouth 3 (three) times daily as needed for allergies. 10/19/2014: ....  Marland Kitchen cyclobenzaprine (FLEXERIL) 5 MG tablet Take 1 tablet (5 mg total) by mouth 3 (three) times daily as needed for muscle spasms.   Marland Kitchen EASY TOUCH PEN NEEDLES 31G X 5 MM MISC USE TO INJECT INSULIN 5 TIMES DAILY   . ferrous gluconate (FERGON) 324 MG tablet Take 1 tablet (324 mg total) by mouth 2 (two) times daily with a meal.   . FLUoxetine (PROZAC) 20 MG tablet Take 2 tablets (40 mg total) by mouth daily.   . fluticasone (FLONASE) 50 MCG/ACT nasal spray Place 2 sprays into both nostrils daily.   . Fluticasone-Salmeterol (ADVAIR DISKUS) 500-50 MCG/DOSE AEPB Inhale 1 puff into the lungs 2 (two) times daily.   . furosemide (LASIX) 80 MG tablet Take 1 tablet (80 mg total) by mouth 2 (two) times daily. (Patient taking differently: Take 80 mg by mouth 2 (two) times daily as needed for fluid. )   . gabapentin (NEURONTIN) 300 MG capsule Take 2 capsules (600 mg total) by mouth 3  (three) times daily.   Marland Kitchen HYDROcodone-acetaminophen (NORCO/VICODIN) 5-325 MG tablet Take 1-2 tablets by mouth every 6 (six) hours as needed for severe pain.   . Insulin Detemir (LEVEMIR FLEXTOUCH) 100 UNIT/ML Pen Inject 20 Units into the skin daily at 10 pm. (Patient taking differently: Inject 40 Units into the skin daily. )   . Insulin Disposable Pump (V-GO 40) KIT FILL AND PLACE VGO40 ABOUT THE SAME TIME EACH DAY. USE 4 TO 5 CLICKS WITH MEALS AND 2 CLICKS WITH SNACKS (Patient not taking: Reported on 04/10/2016)   . Lancets Misc. (ACCU-CHEK FASTCLIX LANCET) KIT Check your blood 4 times a day dx code 250.00 insulin requiring   . lidocaine (XYLOCAINE) 2 % jelly APPLY TO AFFECTED AREA THREE TIMES A DAY (Patient taking differently: APPLY TO AFFECTED AREA THREE TIMES A DAY AS NEEDED FOR PAIN)   . NOVOLOG 100 UNIT/ML injection USE TO FILL VGO40 TO WEAR DAILY.   Marland Kitchen nystatin cream (MYCOSTATIN) APPLY TO AFFECTED AREA TWICE DAILY   . omeprazole (PRILOSEC) 40 MG capsule Take 1 capsule (40 mg total) by mouth 2 (two) times daily.   Glory Rosebush VERIO test strip Check blood sugar up to 4 times a day   . potassium chloride SA (K-DUR,KLOR-CON) 20 MEQ tablet TAKE 1 TABLET BY MOUTH TWICE DAILY (Patient taking differently: TAKE 1 TABLET BY  MOUTH TWICE DAILY AS NEEDED WHEN TAKING LASIX)   . promethazine (PHENERGAN) 12.5 MG tablet Take 1 tablet (12.5 mg total) by mouth every 8 (eight) hours as needed for nausea or vomiting.   . rosuvastatin (CRESTOR) 20 MG tablet TAKE 1 TABLET BY MOUTH EVERY NIGHT AT BEDTIME   . SPIRIVA HANDIHALER 18 MCG inhalation capsule INHALE THE CONTENTS OF 1 CAPSULE VIA HANDIHALER BY MOUTH EVERY DAY   . [DISCONTINUED] cefdinir (OMNICEF) 300 MG capsule Take 1 capsule (300 mg total) by mouth 2 (two) times daily. (Patient not taking: Reported on 04/09/2016)    No facility-administered encounter medications on file as of 04/10/2016.     Functional Status:   In your present state of health, do you have any  difficulty performing the following activities: 03/04/2016 02/18/2016  Hearing? N N  Vision? N N  Difficulty concentrating or making decisions? [name]  Walking or climbing stairs? Y Y  Dressing or bathing? Y Y  Doing errands, shopping? [name]  Preparing Food and eating ? N -  Using the Toilet? N -  In the past six months, have you accidently leaked urine? N -  Do you have problems with loss of bowel control? N -  Managing your Medications? N -  Managing your Finances? Y -  Housekeeping or managing your Housekeeping? Y -  Some recent data might be hidden    Fall/Depression Screening:    PHQ 2/9 Scores 04/10/2016 03/04/2016 02/18/2016 01/11/2016 11/16/2015 09/28/2015 08/08/2015  PHQ - 2 Score 0 1 0 _0 PHQ- 9 Score - - - - - 8 8    Assessment:   Patient recently discharged from the acute care setting. Patient has her medications, has good understanding of medication regimen. Patient smoke several cigarettes in her home during this home visit. Patient was very cordial but showed little interest in smoking cessation, initialing lifestyle changes.  Plan:  Telephone contact in the next 21 days for assessment of community care coordination needs.

## 2016-04-15 ENCOUNTER — Other Ambulatory Visit: Payer: Self-pay | Admitting: Pharmacist

## 2016-04-16 NOTE — Patient Outreach (Signed)
[facility]) Care Management  [place]   04/16/2016  HARBOR PASTER 09-02-1950 [id number]  Subjective: Home visit completed at patient's home. HIPAA identifiers were obtained.  Patient is a 66 year old female with multiple medical conditions, including but not limited to:  Atrial fibrillation, heart failure with preserved ejection fraction, carotid artery stenosis status post endarterectomy, diabetes, hypertension, hyperlipidemia, COPD, osteoporosis, depression/anxiety, history of renal cell carcinoma, fibromyalgia, bilateral cataracts, and chronic low back pain.  She was hospitalized 03/2016 for iron deficiency anemia due to GI bleed.  Patient does not use a pill box to manage her medications. She writes down when she administers her meds and then turns the medication bottles upside down when she has taken her dose.  Patient receives her medications from [facility].    Objective:  AM blood sugar (post coffee with creamer) --186  Encounter Medications: Outpatient Encounter Prescriptions as of 04/15/2016  Medication Sig Note  . albuterol (PROAIR HFA) 108 (90 Base) MCG/ACT inhaler Inhale 1-2 puffs into the lungs every 6 (six) hours as needed for shortness of breath.   . ALPRAZolam (XANAX) 1 MG tablet Take 0.5-1 tablets (0.5-1 mg total) by mouth 2 (two) times daily as needed for anxiety.   Marland Kitchen aspirin EC 325 MG tablet Take 1 tablet (325 mg total) by mouth daily.   . Blood Glucose Monitoring Suppl (ONETOUCH VERIO) W/DEVICE KIT 1 each by Does not apply route 4 (four) times daily.   . chlorpheniramine (CHLOR-TRIMETON) 4 MG tablet Take 1 tablet (4 mg total) by mouth 3 (three) times daily as needed for allergies. 10/19/2014: ....  Marland Kitchen cyclobenzaprine (FLEXERIL) 5 MG tablet Take 1 tablet (5 mg total) by mouth 3 (three) times daily as needed for muscle spasms.   Marland Kitchen EASY TOUCH PEN NEEDLES 31G X 5 MM MISC USE TO INJECT INSULIN 5 TIMES DAILY   . ferrous gluconate  (FERGON) 324 MG tablet Take 1 tablet (324 mg total) by mouth 2 (two) times daily with a meal.   . FLUoxetine (PROZAC) 20 MG tablet Take 2 tablets (40 mg total) by mouth daily.   . fluticasone (FLONASE) 50 MCG/ACT nasal spray Place 2 sprays into both nostrils daily.   . Fluticasone-Salmeterol (ADVAIR DISKUS) 500-50 MCG/DOSE AEPB Inhale 1 puff into the lungs 2 (two) times daily.   . furosemide (LASIX) 80 MG tablet Take 1 tablet (80 mg total) by mouth 2 (two) times daily. (Patient taking differently: Take 80 mg by mouth 2 (two) times daily as needed for fluid. )   . gabapentin (NEURONTIN) 300 MG capsule Take 2 capsules (600 mg total) by mouth 3 (three) times daily.   Marland Kitchen HYDROcodone-acetaminophen (NORCO/VICODIN) 5-325 MG tablet Take 1-2 tablets by mouth every 6 (six) hours as needed for severe pain.   . Insulin Detemir (LEVEMIR FLEXTOUCH) 100 UNIT/ML Pen Inject 20 Units into the skin daily at 10 pm. (Patient taking differently: Inject 40 Units into the skin daily. )   . Insulin Disposable Pump (V-GO 40) KIT FILL AND PLACE VGO40 ABOUT THE SAME TIME EACH DAY. USE 4 TO 5 CLICKS WITH MEALS AND 2 CLICKS WITH SNACKS   . Lancets Misc. (ACCU-CHEK FASTCLIX LANCET) KIT Check your blood 4 times a day dx code 250.00 insulin requiring   . lidocaine (XYLOCAINE) 2 % jelly APPLY TO AFFECTED AREA THREE TIMES A DAY (Patient taking differently: APPLY TO AFFECTED AREA THREE TIMES A DAY AS NEEDED FOR PAIN)   . NOVOLOG 100 UNIT/ML injection USE  TO FILL VGO40 TO WEAR DAILY.   Marland Kitchen nystatin cream (MYCOSTATIN) APPLY TO AFFECTED AREA TWICE DAILY   . omeprazole (PRILOSEC) 40 MG capsule Take 1 capsule (40 mg total) by mouth 2 (two) times daily.   Glory Rosebush VERIO test strip Check blood sugar up to 4 times a day   . potassium chloride SA (K-DUR,KLOR-CON) 20 MEQ tablet TAKE 1 TABLET BY MOUTH TWICE DAILY (Patient taking differently: TAKE 1 TABLET BY MOUTH TWICE DAILY AS NEEDED WHEN TAKING LASIX)   . promethazine (PHENERGAN) 12.5 MG tablet  Take 1 tablet (12.5 mg total) by mouth every 8 (eight) hours as needed for nausea or vomiting.   . rosuvastatin (CRESTOR) 20 MG tablet TAKE 1 TABLET BY MOUTH EVERY NIGHT AT BEDTIME   . SPIRIVA HANDIHALER 18 MCG inhalation capsule INHALE THE CONTENTS OF 1 CAPSULE VIA HANDIHALER BY MOUTH EVERY DAY    No facility-administered encounter medications on file as of 04/15/2016.     Functional Status: In your present state of health, do you have any difficulty performing the following activities: 03/04/2016 02/18/2016  Hearing? N N  Vision? N N  Difficulty concentrating or making decisions? [name]  Walking or climbing stairs? Y Y  Dressing or bathing? Y Y  Doing errands, shopping? [name]  Preparing Food and eating ? N -  Using the Toilet? N -  In the past six months, have you accidently leaked urine? N -  Do you have problems with loss of bowel control? N -  Managing your Medications? N -  Managing your Finances? Y -  Housekeeping or managing your Housekeeping? Y -  Some recent data might be hidden    Fall/Depression Screening: PHQ 2/9 Scores 04/10/2016 03/04/2016 02/18/2016 01/11/2016 11/16/2015 09/28/2015 08/08/2015  PHQ - 2 Score 0 1 0 _0 PHQ- 9 Score - - - - - 8 8    Assessment: Medications were reviewed in the patient's home.  Psych/Neuro Fluoxetine Gabapentin Alprazolam  Cardiovascular Rosuvastatin Metoprolol Tartrate Furosemide Aspirin  Pulmonary Chlorpheniramine Albuterol HFA Fluticasone Nasal Spray Advair Spiriva  Endocrine Novolog Levemir VGo   Topical Lidocaine Nystatin  GI Omeprazole Promethazine  Vitamins Ferrous Gluconate Potassium Chloride  Pain Hydrocodone/APAP Cyclobenzaprine  Medication Review Findings:  Adherence: Patient had a stockpile of medications in her home (including expired medications and medications she is no longer taking) as her medications are on automatic refill at [facility].  As such, she had at least a four  month supply of each of her medications with the exception of hydrocodone/acetaminophen.  However, she did have multiple bottles of alprazolam.  A great deal of time was spent going through the patient's medication cabinet and spare room closet which were both full of medications.  Expired medications were removed from the patient's home and will be destroyed.  Patient was assisted in getting her stockpile of medications organized.  Patient was encouraged to call [facility] about holding shipments until she has used a majority of her stockpile.  ( She was very hesitant about calling to request her shipments be held).  V-Go-patient expressed concern about not being able to use V-Go due to pain in her abdomen.  After stating this, she no longer wanted to discuss V-Go but promised she would mention her issues at her next PCP appointment in April.   Plan:  1.  Follow up with the patient after her provider visit to see if she has any medication changes and to check  up on her.  2.  Send note to PCP  3.  If no issues after PCP appointment, pharmacy case will be closed.  Elayne Guerin, PharmD, [place] Clinical Pharmacist [phone]

## 2016-04-17 MED ORDER — "INSULIN SYRINGE-NEEDLE U-100 31G X 15/64"" 0.5 ML MISC": 3 refills | Status: DC

## 2016-04-18 ENCOUNTER — Emergency Department (HOSPITAL_COMMUNITY): Payer: PPO

## 2016-04-18 ENCOUNTER — Inpatient Hospital Stay (HOSPITAL_COMMUNITY)
Admission: EM | Admit: 2016-04-18 | Discharge: 2016-04-25 | DRG: 281 | Disposition: A | Discharge status: Skilled Nursing Facility | Payer: PPO | Attending: Student in an Organized Health Care Education/Training Program | Admitting: Student in an Organized Health Care Education/Training Program

## 2016-04-18 ENCOUNTER — Other Ambulatory Visit: Payer: Self-pay

## 2016-04-18 ENCOUNTER — Encounter: Payer: Self-pay | Admitting: Interventional Radiology

## 2016-04-18 DIAGNOSIS — M797 Fibromyalgia: Secondary | ICD-10-CM | POA: Diagnosis present

## 2016-04-18 DIAGNOSIS — F329 Major depressive disorder, single episode, unspecified: Secondary | ICD-10-CM | POA: Diagnosis not present

## 2016-04-18 DIAGNOSIS — I50810 Right heart failure, unspecified: Secondary | ICD-10-CM | POA: Diagnosis not present

## 2016-04-18 DIAGNOSIS — I5082 Biventricular heart failure: Secondary | ICD-10-CM | POA: Diagnosis present

## 2016-04-18 DIAGNOSIS — J449 Chronic obstructive pulmonary disease, unspecified: Secondary | ICD-10-CM

## 2016-04-18 DIAGNOSIS — G4733 Obstructive sleep apnea (adult) (pediatric): Secondary | ICD-10-CM | POA: Diagnosis not present

## 2016-04-18 DIAGNOSIS — D638 Anemia in other chronic diseases classified elsewhere: Secondary | ICD-10-CM | POA: Diagnosis not present

## 2016-04-18 DIAGNOSIS — R072 Precordial pain: Secondary | ICD-10-CM

## 2016-04-18 DIAGNOSIS — Z720 Tobacco use: Secondary | ICD-10-CM | POA: Diagnosis present

## 2016-04-18 DIAGNOSIS — I213 ST elevation (STEMI) myocardial infarction of unspecified site: Secondary | ICD-10-CM | POA: Diagnosis not present

## 2016-04-18 DIAGNOSIS — Z7982 Long term (current) use of aspirin: Secondary | ICD-10-CM

## 2016-04-18 DIAGNOSIS — E669 Obesity, unspecified: Secondary | ICD-10-CM | POA: Diagnosis not present

## 2016-04-18 DIAGNOSIS — E119 Type 2 diabetes mellitus without complications: Secondary | ICD-10-CM | POA: Diagnosis not present

## 2016-04-18 DIAGNOSIS — D62 Acute posthemorrhagic anemia: Secondary | ICD-10-CM | POA: Diagnosis not present

## 2016-04-18 DIAGNOSIS — K219 Gastro-esophageal reflux disease without esophagitis: Secondary | ICD-10-CM | POA: Diagnosis present

## 2016-04-18 DIAGNOSIS — I2723 Pulmonary hypertension due to lung diseases and hypoxia: Secondary | ICD-10-CM

## 2016-04-18 DIAGNOSIS — I5033 Acute on chronic diastolic (congestive) heart failure: Secondary | ICD-10-CM | POA: Diagnosis present

## 2016-04-18 DIAGNOSIS — Z9981 Dependence on supplemental oxygen: Secondary | ICD-10-CM | POA: Diagnosis not present

## 2016-04-18 DIAGNOSIS — Z953 Presence of xenogenic heart valve: Secondary | ICD-10-CM | POA: Diagnosis not present

## 2016-04-18 DIAGNOSIS — J439 Emphysema, unspecified: Secondary | ICD-10-CM | POA: Diagnosis present

## 2016-04-18 DIAGNOSIS — I509 Heart failure, unspecified: Secondary | ICD-10-CM | POA: Diagnosis present

## 2016-04-18 DIAGNOSIS — I2721 Secondary pulmonary arterial hypertension: Secondary | ICD-10-CM | POA: Diagnosis present

## 2016-04-18 DIAGNOSIS — I272 Pulmonary hypertension, unspecified: Secondary | ICD-10-CM | POA: Diagnosis not present

## 2016-04-18 DIAGNOSIS — R51 Headache: Secondary | ICD-10-CM | POA: Diagnosis not present

## 2016-04-18 DIAGNOSIS — F17200 Nicotine dependence, unspecified, uncomplicated: Secondary | ICD-10-CM | POA: Diagnosis present

## 2016-04-18 DIAGNOSIS — I5032 Chronic diastolic (congestive) heart failure: Secondary | ICD-10-CM | POA: Diagnosis present

## 2016-04-18 DIAGNOSIS — Z794 Long term (current) use of insulin: Secondary | ICD-10-CM

## 2016-04-18 DIAGNOSIS — K449 Diaphragmatic hernia without obstruction or gangrene: Secondary | ICD-10-CM | POA: Diagnosis present

## 2016-04-18 DIAGNOSIS — I2729 Other secondary pulmonary hypertension: Secondary | ICD-10-CM | POA: Diagnosis not present

## 2016-04-18 DIAGNOSIS — R079 Chest pain, unspecified: Secondary | ICD-10-CM

## 2016-04-18 DIAGNOSIS — Z6835 Body mass index (BMI) 35.0-35.9, adult: Secondary | ICD-10-CM

## 2016-04-18 DIAGNOSIS — E7800 Pure hypercholesterolemia, unspecified: Secondary | ICD-10-CM | POA: Diagnosis not present

## 2016-04-18 DIAGNOSIS — I21A1 Myocardial infarction type 2: Secondary | ICD-10-CM | POA: Diagnosis not present

## 2016-04-18 DIAGNOSIS — F17210 Nicotine dependence, cigarettes, uncomplicated: Secondary | ICD-10-CM | POA: Diagnosis present

## 2016-04-18 DIAGNOSIS — Z8719 Personal history of other diseases of the digestive system: Secondary | ICD-10-CM

## 2016-04-18 DIAGNOSIS — I2510 Atherosclerotic heart disease of native coronary artery without angina pectoris: Secondary | ICD-10-CM | POA: Diagnosis not present

## 2016-04-18 DIAGNOSIS — K922 Gastrointestinal hemorrhage, unspecified: Secondary | ICD-10-CM | POA: Diagnosis not present

## 2016-04-18 DIAGNOSIS — E1140 Type 2 diabetes mellitus with diabetic neuropathy, unspecified: Secondary | ICD-10-CM

## 2016-04-18 DIAGNOSIS — F419 Anxiety disorder, unspecified: Secondary | ICD-10-CM | POA: Diagnosis present

## 2016-04-18 DIAGNOSIS — H53149 Visual discomfort, unspecified: Secondary | ICD-10-CM | POA: Diagnosis not present

## 2016-04-18 DIAGNOSIS — I493 Ventricular premature depolarization: Secondary | ICD-10-CM | POA: Diagnosis present

## 2016-04-18 DIAGNOSIS — F418 Other specified anxiety disorders: Secondary | ICD-10-CM | POA: Diagnosis not present

## 2016-04-18 DIAGNOSIS — Z952 Presence of prosthetic heart valve: Secondary | ICD-10-CM | POA: Diagnosis not present

## 2016-04-18 DIAGNOSIS — G8929 Other chronic pain: Secondary | ICD-10-CM | POA: Diagnosis present

## 2016-04-18 DIAGNOSIS — I214 Non-ST elevation (NSTEMI) myocardial infarction: Secondary | ICD-10-CM | POA: Diagnosis not present

## 2016-04-18 DIAGNOSIS — I480 Paroxysmal atrial fibrillation: Secondary | ICD-10-CM | POA: Diagnosis present

## 2016-04-18 DIAGNOSIS — Z79899 Other long term (current) drug therapy: Secondary | ICD-10-CM

## 2016-04-18 DIAGNOSIS — C642 Malignant neoplasm of left kidney, except renal pelvis: Secondary | ICD-10-CM | POA: Diagnosis not present

## 2016-04-18 DIAGNOSIS — E785 Hyperlipidemia, unspecified: Secondary | ICD-10-CM | POA: Diagnosis present

## 2016-04-18 DIAGNOSIS — E118 Type 2 diabetes mellitus with unspecified complications: Secondary | ICD-10-CM | POA: Diagnosis present

## 2016-04-18 DIAGNOSIS — D500 Iron deficiency anemia secondary to blood loss (chronic): Secondary | ICD-10-CM | POA: Diagnosis present

## 2016-04-18 DIAGNOSIS — Z9889 Other specified postprocedural states: Secondary | ICD-10-CM | POA: Diagnosis not present

## 2016-04-18 DIAGNOSIS — Z8249 Family history of ischemic heart disease and other diseases of the circulatory system: Secondary | ICD-10-CM

## 2016-04-18 DIAGNOSIS — D509 Iron deficiency anemia, unspecified: Secondary | ICD-10-CM | POA: Diagnosis not present

## 2016-04-18 DIAGNOSIS — Z8669 Personal history of other diseases of the nervous system and sense organs: Secondary | ICD-10-CM | POA: Diagnosis present

## 2016-04-18 DIAGNOSIS — E1169 Type 2 diabetes mellitus with other specified complication: Secondary | ICD-10-CM | POA: Diagnosis present

## 2016-04-18 DIAGNOSIS — J9611 Chronic respiratory failure with hypoxia: Secondary | ICD-10-CM | POA: Diagnosis not present

## 2016-04-18 LAB — BASIC METABOLIC PANEL
Anion gap: 11 (ref 5–15)
BUN: 13 mg/dL (ref 6–20)
CO2: 28 mmol/L (ref 22–32)
Calcium: 9.6 mg/dL (ref 8.9–10.3)
Chloride: 101 mmol/L (ref 101–111)
Creatinine, Ser: 1.09 mg/dL — ABNORMAL HIGH (ref 0.44–1.00)
GFR calc Af Amer: 60 mL/min — ABNORMAL LOW (ref >60)
GFR calc non Af Amer: 52 mL/min — ABNORMAL LOW (ref >60)
Glucose, Bld: 81 mg/dL (ref 65–99)
Potassium: 3.6 mmol/L (ref 3.5–5.1)
Sodium: 140 mmol/L (ref 135–145)

## 2016-04-18 LAB — I-STAT TROPONIN, ED: Troponin i, poc: 0.07 ng/mL (ref 0.00–0.08)

## 2016-04-18 LAB — CBC
HCT: 32.9 % — ABNORMAL LOW (ref 36.0–46.0)
Hemoglobin: 9.9 g/dL — ABNORMAL LOW (ref 12.0–15.0)
MCH: 28.3 pg (ref 26.0–34.0)
MCHC: 30.1 g/dL (ref 30.0–36.0)
MCV: 94 fL (ref 78.0–100.0)
Platelets: 215 10*3/uL (ref 150–400)
RBC: 3.5 MIL/uL — ABNORMAL LOW (ref 3.87–5.11)
RDW: 21.8 % — ABNORMAL HIGH (ref 11.5–15.5)
WBC: 9.4 10*3/uL (ref 4.0–10.5)

## 2016-04-18 MED ORDER — DIPHENHYDRAMINE HCL 50 MG/ML IJ SOLN
12.5000 mg | Freq: Once | INTRAMUSCULAR | Status: AC
  Administered 2016-04-18: 12.5 mg via INTRAVENOUS
  Filled 2016-04-18: qty 1

## 2016-04-18 MED ORDER — VALPROATE SODIUM 500 MG/5ML IV SOLN
500.0000 mg | Freq: Once | INTRAVENOUS | Status: AC
  Administered 2016-04-19: 500 mg via INTRAVENOUS
  Filled 2016-04-18: qty 5

## 2016-04-18 MED ORDER — METOCLOPRAMIDE HCL 5 MG/ML IJ SOLN
10.0000 mg | Freq: Once | INTRAMUSCULAR | Status: AC
  Administered 2016-04-18: 10 mg via INTRAVENOUS
  Filled 2016-04-18: qty 2

## 2016-04-18 MED ORDER — SODIUM CHLORIDE 0.9 % IV BOLUS (SEPSIS)
500.0000 mL | Freq: Once | INTRAVENOUS | Status: AC
  Administered 2016-04-18: 500 mL via INTRAVENOUS

## 2016-04-18 NOTE — Patient Outreach (Signed)
[facility]) Care Management  04/18/2016   [name] 1950-02-16 [id number]  Subjective:  I am tired but I feel okay  Objective:  Telephonic encounter  Current Medications:  Current Outpatient Prescriptions  Medication Sig Dispense Refill  . albuterol (PROAIR HFA) 108 (90 Base) MCG/ACT inhaler Inhale 1-2 puffs into the lungs every 6 (six) hours as needed for shortness of breath. 8.5 each 11  . ALPRAZolam (XANAX) 1 MG tablet Take 0.5-1 tablets (0.5-1 mg total) by mouth 2 (two) times daily as needed for anxiety. 65 tablet 5  . aspirin EC 325 MG tablet Take 1 tablet (325 mg total) by mouth daily. 30 tablet 0  . Blood Glucose Monitoring Suppl (ONETOUCH VERIO) W/DEVICE KIT 1 each by Does not apply route 4 (four) times daily. 1 kit 0  . chlorpheniramine (CHLOR-TRIMETON) 4 MG tablet Take 1 tablet (4 mg total) by mouth 3 (three) times daily as needed for allergies. 90 tablet 11  . cyclobenzaprine (FLEXERIL) 5 MG tablet Take 1 tablet (5 mg total) by mouth 3 (three) times daily as needed for muscle spasms. 90 tablet 3  . EASY TOUCH PEN NEEDLES 31G X 5 MM MISC USE TO INJECT INSULIN 5 TIMES DAILY 200 each 8  . ferrous gluconate (FERGON) 324 MG tablet Take 1 tablet (324 mg total) by mouth 2 (two) times daily with a meal. 180 tablet 3  . FLUoxetine (PROZAC) 20 MG tablet Take 2 tablets (40 mg total) by mouth daily. 180 tablet 3  . fluticasone (FLONASE) 50 MCG/ACT nasal spray Place 2 sprays into both nostrils daily. 16 g 3  . Fluticasone-Salmeterol (ADVAIR DISKUS) 500-50 MCG/DOSE AEPB Inhale 1 puff into the lungs 2 (two) times daily. 180 each 3  . furosemide (LASIX) 80 MG tablet Take 1 tablet (80 mg total) by mouth 2 (two) times daily. (Patient taking differently: Take 80 mg by mouth 2 (two) times daily as needed for fluid. ) 180 tablet 3  . gabapentin (NEURONTIN) 300 MG capsule Take 2 capsules (600 mg total) by mouth 3 (three) times daily. 540 capsule 3  .  HYDROcodone-acetaminophen (NORCO/VICODIN) 5-325 MG tablet Take 1-2 tablets by mouth every 6 (six) hours as needed for severe pain. 150 tablet 0  . Insulin Detemir (LEVEMIR FLEXTOUCH) 100 UNIT/ML Pen Inject 20 Units into the skin daily at 10 pm. (Patient taking differently: Inject 40 Units into the skin daily. ) 15 mL 5  . Insulin Disposable Pump (V-GO 40) KIT FILL AND PLACE VGO40 ABOUT THE SAME TIME EACH DAY. USE 4 TO 5 CLICKS WITH MEALS AND 2 CLICKS WITH SNACKS 30 kit 5  . Insulin Syringe-Needle U-100 31G X 15/64" 0.5 ML MISC Use to inject insulin up to 4 times a day 400 each 3  . Lancets Misc. (ACCU-CHEK FASTCLIX LANCET) KIT Check your blood 4 times a day dx code 250.00 insulin requiring 1 kit 2  . lidocaine (XYLOCAINE) 2 % jelly APPLY TO AFFECTED AREA THREE TIMES A DAY (Patient taking differently: APPLY TO AFFECTED AREA THREE TIMES A DAY AS NEEDED FOR PAIN) 30 mL 10  . NOVOLOG 100 UNIT/ML injection USE TO FILL VGO40 TO WEAR DAILY. 30 mL 5  . nystatin cream (MYCOSTATIN) APPLY TO AFFECTED AREA TWICE DAILY 30 g 2  . omeprazole (PRILOSEC) 40 MG capsule Take 1 capsule (40 mg total) by mouth 2 (two) times daily. 180 capsule 3  . ONETOUCH VERIO test strip Check blood sugar up to 4 times a day 350 each 11  .  potassium chloride SA (K-DUR,KLOR-CON) 20 MEQ tablet TAKE 1 TABLET BY MOUTH TWICE DAILY (Patient taking differently: TAKE 1 TABLET BY MOUTH TWICE DAILY AS NEEDED WHEN TAKING LASIX) 180 tablet 2  . promethazine (PHENERGAN) 12.5 MG tablet Take 1 tablet (12.5 mg total) by mouth every 8 (eight) hours as needed for nausea or vomiting. 30 tablet 0  . rosuvastatin (CRESTOR) 20 MG tablet TAKE 1 TABLET BY MOUTH EVERY NIGHT AT BEDTIME 90 tablet 2  . SPIRIVA HANDIHALER 18 MCG inhalation capsule INHALE THE CONTENTS OF 1 CAPSULE VIA HANDIHALER BY MOUTH EVERY DAY 90 capsule 2   No current facility-administered medications for this visit.     Functional Status:  In your present state of health, do you have any  difficulty performing the following activities: 03/04/2016 02/18/2016  Hearing? N N  Vision? N N  Difficulty concentrating or making decisions? [name]  Walking or climbing stairs? Y Y  Dressing or bathing? Y Y  Doing errands, shopping? [name]  Preparing Food and eating ? N -  Using the Toilet? N -  In the past six months, have you accidently leaked urine? N -  Do you have problems with loss of bowel control? N -  Managing your Medications? N -  Managing your Finances? Y -  Housekeeping or managing your Housekeeping? Y -  Some recent data might be hidden    Fall/Depression Screening: PHQ 2/9 Scores 04/10/2016 03/04/2016 02/18/2016 01/11/2016 11/16/2015 09/28/2015 08/08/2015  PHQ - 2 Score 0 1 0 _0 PHQ- 9 Score - - - - - 8 8   [facility] CM Care Plan Problem One   Flowsheet Row Most Recent Value  Care Plan Problem One  patient has multiple chronic illnesses  Role Documenting the Problem One  Care Management Coordinator  Care Plan for Problem One  Active  THN Long Term Goal (31-90 days)  In the next 31 days, member will meet with [facility] RNCM for assessment of chronic disease educational needs  [facility]N Long Term Goal Start Date  03/30/16  [facility] Long Term Goal Met Date  04/18/16  Interventions for Problem One Long Term Goal  telephone contact for assessment of progression in meeting her case management goals  [facility] CM Short Term Goal #1 (0-30 days)  In the next 28 days, patient will meet with [facility] LCSW for psycosocial assessment   [facility] CM Short Term Goal #1 Start Date  03/30/16  [facility] CM Short Term Goal #1 Met Date  04/18/16  Interventions for Short Term Goal #1  contact made with LCSW for initation of contact  to arrange telephone contact in the nearfuture    [facility] CM Care Plan Problem Two   Flowsheet Row Most Recent Value  Care Plan Problem Two  Patient had a recent acute care admission  Role Documenting the Problem Two  Care Management [place] for Problem Two  Active  THN Long Term Goal (31-90)  days  In the next 31 days, patient will have no acute care admissions  [facility] Long Term Goal Start Date  04/01/16  [facility] CM Short Term Goal #1 (0-30 days)  in the next 28 days, patient will make and attend a post acute care discharge with her primary care physiican  [facility] CM Short Term Goal #1 Start Date  04/01/16       Assessment:  Patient has had no acute care admissions since last home visit. Patient states she has been adhering to her  medication regimen since meeting with [facility] Pharmacist for medication reconciliation/education  Plan:  Telephone contact in the next 21 days for community care coordination

## 2016-04-18 NOTE — ED Triage Notes (Addendum)
Pt presents to the ed with complaints of cramping in her chest and legs, she is normally on coumadin but was taken off 2 weeks ago due to a GI bleed, she had an endoscopy that was negative, denies any sob.  She is also having pain in her left arm and a headache.  She received 324 of aspirin and 1 nitro in route with ems

## 2016-04-18 NOTE — ED Notes (Signed)
Delay in lab draw,  Pt not in room

## 2016-04-18 NOTE — ED Provider Notes (Signed)
[facility] DEPT Provider Note   CSN: [id number] Arrival date & time: 04/18/16  2058     History   Chief Complaint Chief Complaint  Patient presents with  . Chest Pain    HPI [name] is a 66 y.o. female.  HPI  66 year old female with history of fibromyalgia, paroxysmal atrial fibrillation, CHF, DM 2, recently taken off of Coumadin 2 weeks ago secondary to GI bleed of unknown origin, who presents with "pain everywhere." Patient reports severe cramping pain in her bilateral legs, arms, head, chest, and back. The cramps have been intermittent since this morning. Pain in her chest is substernal, described as a cramping sensation, nonpleuritic and nonexertional. Occurs for a minute or two at a time and then resolves. Not accompanied by nausea, vomiting, or diaphoresis. Patient currently denies any chest pain. No history of coronary artery disease. Pain in her head began about an hour ago. Was sudden in onset, described as frontal, cramping, and severe. Denies vision changes. Patient initially endorsed some numbness in her left upper extremity, that resolved throughout the course of our conversation. Denies fevers. Denies blood in her stool or dark tarry stools. No hematemesis.  Past Medical History:  Diagnosis Date  . Adenomatous polyps 05/14/2011   Colonoscopy (05/2011): 4 mm adenomatous polyp excised endoscopically Colonoscopy (02/2002): Adenomatous polyp excised endoscopically   . Allergic rhinitis 06/01/2012  . Anemia of chronic disease 01/01/2013  . Anxiety 07/24/2010  . Aortic atherosclerosis ([place]) 10/19/2014   Seen on CT scan, currently asymptomatic  . 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 Asymptomatic cholelithiasis 09/25/2015   Seen on CT scan 08/2015  . Bilateral cataracts 08/04/2012   Visually insignificant   . Carotid artery stenosis    s/p right endarterectomy (06/2010) Carotid US  (07/2010):  Left: Moderate-to-severe (60-79%) calcific and non-calcific plaque origin and proximal ICA and ECA   . Chronic congestive heart failure with left ventricular diastolic dysfunction ([place]) 10/21/2010  . Chronic constipation 02/03/2011  . Chronic daily headache 01/16/2014  . Chronic low back pain 10/06/2012  . Chronic venous insufficiency 08/04/2012  . Clear cell renal cell carcinoma ([place]) 07/21/2011   s/p cryoablation of left RCC in 09/2011 by [name]. Followed by [name]  [facility]) .    Marland Kitchen COPD (chronic obstructive pulmonary disease) with emphysema (HCC)    PFTs 02/2012: FEV1 0.92 (40%), ratio 69, 27% increase in FEV1 with BD, TLC 91%, severe airtrapping, DLCO49% On chronic home O2. Pulmonary rehab referral 05/2012   . Depression 11/19/2005  . Esophageal stenosis 08/08/2015   Mild, benign-appearing on EGD 06/29/2015  . Fibromyalgia 08/29/2010  . Gastroesophageal reflux disease   . History of blood transfusion    "several times"   . Hyperlipidemia LDL goal < 100 11/20/2005  . Internal hemorrhoids 08/04/2012  . Mitral stenosis    s/p Mitral valve replacement with a 27-mm pericardial porcine valve (Medtronic Mosaic valve, serial #[id number] on 09/20/10, Dr. Prescott Gum)   . Moderate to severe pulmonary hypertension    2014 TEE w PA peak pressure 46 mmHg, s/p MV replacement   . Obesity (BMI 30.0-34.9) 10/23/2011  . Obstructive sleep apnea    Nocturnal polysomnography (06/2009): Moderate sleep apnea/ hypopnea syndrome , AHI 17.8 per hour with nonpositional hypopneas. CPAP titration to 12 CWP, AHI 2.4 per hour. On nocturnal CPAP via a small resMed Quattro full-face mask with heated humidifier.   . Osteoporosis    DEXA (  12/09/2011): L-spine T -3.7, left hip T -1.4 DEXA (12/2004): L-spine T -2.6, left hip -0.1   . Paroxysmal atrial fibrillation ([place]) 10/22/2010   s/p Left atrial maze procedure for paroxysmal atrial fibrillation on 09/20/2010 by Dr Prescott Gum.  Subsequent splenic  infarct, decision was made to re-anticoagulate with coumadin, likely life-long as this is the most likely cause of the splenic infarct.   . Right nephrolithiasis 09/06/2014   5 mm non-obstructing calculus seen on CT scan 09/05/2014   . Seborrheic keratosis 09/28/2015  . Shortness of breath dyspnea   . Tobacco abuse 07/28/2012  . Type 2 diabetes mellitus with diabetic neuropathy [facility])     Patient Active Problem List   Diagnosis Date Noted  . Chest pain 04/19/2016  . Acute blood loss anemia 03/27/2016  . Fatigue 02/18/2016  . Unexplained night sweats 12/14/2015  . Seborrheic keratosis 09/28/2015  . Asymptomatic cholelithiasis 09/25/2015  . Esophageal stenosis 08/08/2015  . Arteriovenous malformation of gastrointestinal tract 08/08/2015  . Aortic atherosclerosis ([place]) 10/19/2014  . Right nephrolithiasis 09/06/2014  . Vulvovaginal candidiasis 09/20/2013  . Chronic daily headache 01/14/2013  . Anemia of chronic disease 01/01/2013  . Chronic low back pain 10/06/2012  . Long term current use of anticoagulant therapy 09/30/2012  . Bilateral cataracts 08/04/2012  . Internal and external hemorrhoids without complication 17/61/6073  . Chronic venous insufficiency 08/04/2012  . Tobacco abuse 07/28/2012  . Allergic rhinitis 06/01/2012  . Obesity (BMI 30.0-34.9) 10/23/2011  . Clear cell renal cell carcinoma ([place]) 07/21/2011  . Adenomatous polyps 05/14/2011  . Health care maintenance 11/04/2010  . Paroxysmal atrial fibrillation ([place]) 10/22/2010  . Chronic congestive heart failure with left ventricular diastolic dysfunction ([place]) 10/21/2010  . Fibromyalgia 08/29/2010  . Gastroesophageal reflux disease with hiatal hernia   . Type 2 diabetes mellitus with diabetic neuropathy ([place])   . Pulmonary emphysema ([place])   . Moderate to severe pulmonary hypertension   . Osteoporosis   . Obstructive sleep apnea   . Mitral stenosis   . Stenosis of right carotid artery   . Anxiety 07/24/2010  .  Hyperlipidemia 11/20/2005  . Major depression, chronic 11/19/2005    Past Surgical History:  Procedure Laterality Date  . CARDIAC VALVE REPLACEMENT  Aug. 2012   "mitral valve"  . CAROTID ENDARTERECTOMY Right 07/04/2010   by [name] for asymptomatic right carotid artery stenosis  . CHEST TUBE INSERTION  09/24/2010   Dr Prescott Gum  . COLONOSCOPY  05/12/2011   performed by Dr. Michail Sermon. Showing small internal hemorrhoids, single tubular adenoma polyp  . CRYOABLATION Left 09/2011   by [name]. Followed by [name]  [facility]) .    Marland Kitchen DILATION AND CURETTAGE OF UTERUS    . ESOPHAGOGASTRODUODENOSCOPY  05/12/2011   performed by Dr. Michail Sermon. Negative for ulcerations, biopsy negative for evidence of celiac sprue  . ESOPHAGOGASTRODUODENOSCOPY N/A 06/29/2015   Procedure: ESOPHAGOGASTRODUODENOSCOPY (EGD);  Surgeon: Clarene Essex, MD;  Location: [facility] ENDOSCOPY;  Service: Endoscopy;  Laterality: N/A;  . ESOPHAGOGASTRODUODENOSCOPY N/A 03/29/2016   Procedure: ESOPHAGOGASTRODUODENOSCOPY (EGD);  Surgeon: Clarene Essex, MD;  Location[facility] ENDOSCOPY;  Service: Endoscopy;  Laterality: N/A;  . FRACTURE SURGERY Right 2005   clavicle  . GIVENS CAPSULE STUDY N/A 06/30/2015   Procedure: GIVENS CAPSULE STUDY;  Surgeon: Clarene Essex, MD;  Location: [facility] ENDOSCOPY;  Service: Endoscopy;  Laterality: N/A;  . GIVENS CAPSULE STUDY N/A 06/29/2015   Procedure: GIVENS CAPSULE STUDY;  Surgeon: Clarene Essex, MD;  Location: [facility];  Service: Endoscopy;  Laterality: N/A;  . Hysteroscopy with endometrial ablation  06/2001   for persistent post-menopausal bleeding // by S. [name], M.D.  . IR GENERIC HISTORICAL  08/23/2015   IR RADIOLOGIST EVAL & MGMT 08/23/2015 Aletta Edouard, MD [facility] INTERV RAD  . IR GENERIC HISTORICAL  04/09/2016   IR RADIOLOGIST EVAL & MGMT 04/09/2016 Aletta Edouard, MD [facility] INTERV RAD  . LIPOMA EXCISION  08/2005   occipital lipoma 1.5cm - by Dr. [name]  . MAZE Left 09/20/10   for paroxysmal  atrial fibrillation (Dr. Prescott Gum)  . MITRAL VALVE REPLACEMENT  09/20/10    with a 27-mm pericardial porcine valve (Medtronic Mosaic valve, serial #[id number]). 09/20/10, Dr Prescott Gum  . ORIF CLAVICLE FRACTURE  01/2004   by Thana Farr. [name], M.D for Right clavicle nonunion.  . TONSILLECTOMY    . TUBAL LIGATION      OB History    No data available       Home Medications    Prior to Admission medications   Medication Sig Start Date End Date Taking? Authorizing Provider  albuterol (PROAIR HFA) 108 (90 Base) MCG/ACT inhaler Inhale 1-2 puffs into the lungs every 6 (six) hours as needed for shortness of breath. 11/14/15  Yes [name], MD  ALPRAZolam [name]) 1 MG tablet Take 0.5-1 tablets (0.5-1 mg total) by mouth 2 (two) times daily as needed for anxiety. Patient taking differently: Take 1 mg by mouth at bedtime.  02/13/16  Yes [name], MD  aspirin EC 325 MG tablet Take 1 tablet (325 mg total) by mouth daily. 03/29/16  Yes Lorella Nimrod, MD  chlorpheniramine (CHLOR-TRIMETON) 4 MG tablet Take 1 tablet (4 mg total) by mouth 3 (three) times daily as needed for allergies. 10/06/14  Yes [name], MD  cyclobenzaprine (FLEXERIL) 5 MG tablet Take 1 tablet (5 mg total) by mouth 3 (three) times daily as needed for muscle spasms. 01/11/16  Yes [name], MD  ferrous gluconate (FERGON) 324 MG tablet Take 1 tablet (324 mg total) by mouth 2 (two) times daily with a meal. Patient taking differently: Take 324 mg by mouth daily.  11/16/15  Yes [name], MD  FLUoxetine (PROZAC) 20 MG tablet Take 2 tablets (40 mg total) by mouth daily. 12/14/15  Yes [name], MD  fluticasone (FLONASE) 50 MCG/ACT nasal spray Place 2 sprays into both nostrils daily. Patient taking differently: Place 2 sprays into both nostrils at bedtime.  02/08/15  Yes [name], MD  Fluticasone-Salmeterol (ADVAIR DISKUS) 500-50 MCG/DOSE AEPB Inhale 1 puff into the lungs 2 (two) times daily. 12/14/15  Yes [name], MD  furosemide (LASIX) 80 MG tablet Take 1 tablet (80 mg total) by mouth 2 (two) times daily. Patient taking differently: Take 80 mg by mouth 2 (two) times daily as needed for fluid.  06/28/15  Yes [name], MD  gabapentin (NEURONTIN) 300 MG capsule Take 2 capsules (600 mg total) by mouth 3 (three) times daily. 11/16/15  Yes [name], MD  HYDROcodone-acetaminophen (NORCO/VICODIN) 5-325 MG tablet Take 1-2 tablets by mouth every 6 (six) hours as needed for severe pain. Patient taking differently: Take 2 tablets by mouth at bedtime.  11/16/15  Yes [name], MD  insulin aspart (NOVOLOG) 100 UNIT/ML injection Inject 18-28 Units into the skin See admin instructions. Inject 18 units plus adjustment for CBG (sliding scale) - 3 times daily before meals   Yes Historical Provider, MD  Insulin Detemir (LEVEMIR FLEXTOUCH) 100 UNIT/ML Pen Inject 20 Units into the  skin daily at 10 pm. Patient taking differently: Inject 45 Units into the skin daily before breakfast.  02/13/16  Yes [name], MD  lidocaine (XYLOCAINE) 2 % jelly APPLY TO AFFECTED AREA THREE TIMES A DAY Patient taking differently: APPLY TO AFFECTED AREA THREE TIMES A DAY AS NEEDED FOR PAIN 09/19/15  Yes Sid Falcon, MD  nystatin cream (MYCOSTATIN) APPLY TO AFFECTED AREA TWICE DAILY Patient taking differently: APPLY TO AFFECTED AREA TWICE DAILY AS NEEDED FOR RASH 04/03/16  Yes [name], MD  omeprazole (PRILOSEC) 40 MG capsule Take 1 capsule (40 mg total) by mouth 2 (two) times daily. 08/22/15  Yes [name], MD  OXYGEN Inhale 2 L into the lungs as needed (for shortness of breath while active).   Yes Historical Provider, MD  potassium chloride SA (K-DUR,KLOR-CON) 20 MEQ tablet TAKE 1 TABLET BY MOUTH TWICE DAILY Patient taking differently: TAKE 1 TABLET BY MOUTH TWICE DAILY AS NEEDED - WITH EACH DOSE OF LASIX 09/19/15  Yes Sid Falcon, MD  PRESCRIPTION MEDICATION Inhale into the lungs See admin instructions. CPAP - use  whenever sleeping   Yes Historical Provider, MD  promethazine (PHENERGAN) 12.5 MG tablet Take 1 tablet (12.5 mg total) by mouth every 8 (eight) hours as needed for nausea or vomiting. 07/13/15  Yes Alex Ronnie Derby, DO  rosuvastatin (CRESTOR) 20 MG tablet TAKE 1 TABLET BY MOUTH EVERY NIGHT AT BEDTIME 09/19/15  Yes Sid Falcon, MD  SPIRIVA HANDIHALER 18 MCG inhalation capsule INHALE THE CONTENTS OF 1 CAPSULE VIA HANDIHALER BY MOUTH EVERY DAY 09/19/15  Yes Sid Falcon, MD  Blood Glucose Monitoring Suppl (ONETOUCH VERIO) W/DEVICE KIT 1 each by Does not apply route 4 (four) times daily. 07/25/14   [name], MD  EASY TOUCH PEN NEEDLES 31G X 5 MM [place] USE TO INJECT INSULIN 5 TIMES DAILY 07/26/15   [name], MD  Insulin Disposable Pump (V-GO 40) KIT FILL AND PLACE VGO40 ABOUT THE SAME TIME EACH DAY. USE 4 TO 5 CLICKS WITH MEALS AND 2 CLICKS WITH SNACKS Patient not taking: Reported on 04/18/2016 03/04/16   [name], MD  Insulin Syringe-Needle U-100 31G X 15/64" 0.5 ML MISC Use to inject insulin up to 4 times a day 04/17/16   [name], MD  Lancets Misc. (ACCU-CHEK FASTCLIX LANCET) KIT Check your blood 4 times a day dx code 250.00 insulin requiring 01/19/13   [name], MD  NOVOLOG 100 UNIT/ML injection USE TO FILL VGO40 TO WEAR DAILY. Patient not taking: Reported on 04/18/2016 03/04/16   [name], MD  [facility] VERIO test strip Check blood sugar up to 4 times a day 02/28/15   [name], MD    Family History Family History  Problem Relation Age of Onset  . Peptic Ulcer Disease Father   . Heart attack Father 41    Died of MI at age 68  . Heart attack Brother 5    Died of MI at age 32  . Obesity Brother   . Pneumonia Mother   . Healthy Sister   . Lupus Daughter   . Obsessive Compulsive Disorder Daughter     Social History Social History  Substance Use Topics  . Smoking status: Current Every Day Smoker    Packs/day: 1.00    Years: 53.00    Types: Cigarettes     Start date: 11/01/1962  . Smokeless tobacco: Never Used     Comment: 1/2 to 1 pack per day  . Alcohol use  No     Allergies   Morphine and related; Oxycontin [oxycodone]; Tramadol hcl; and Lorazepam   Review of Systems Review of Systems  Constitutional: Negative for activity change, appetite change, chills and fever.  HENT: Negative for congestion, rhinorrhea and sore throat.   Eyes: Negative for visual disturbance.  Respiratory: Negative for cough, shortness of breath and wheezing.   Cardiovascular: Positive for chest pain (not present currently). Negative for palpitations.  Gastrointestinal: Negative for abdominal distention, abdominal pain, blood in stool, constipation, diarrhea, nausea and vomiting.  Genitourinary: Negative for dysuria, flank pain and frequency.  Musculoskeletal: Positive for arthralgias (diffuse, chronic), back pain (chronic, unchanged), myalgias (bilateral legs) and neck pain. Negative for gait problem, joint swelling and neck stiffness.  Skin: Negative for rash.  Neurological: Positive for headaches. Negative for dizziness, tremors, syncope, facial asymmetry, speech difficulty, weakness and numbness.  Psychiatric/Behavioral: Negative for agitation, behavioral problems and confusion.     Physical Exam Updated Vital Signs BP (!) 145/53   Pulse 81   Temp 98.1 F (36.7 C) (Oral)   Resp 22   Ht _0  (1.575 m)   Wt 88.9 kg   SpO2 93%   BMI 35.85 kg/m   Physical Exam  Constitutional: She is oriented to person, place, and time. She appears well-developed and well-nourished. No distress.  HENT:  Head: Normocephalic and atraumatic.  Right Ear: External ear normal.  Left Ear: External ear normal.  Nose: Nose normal.  Mouth/Throat: Oropharynx is clear and moist. No oropharyngeal exudate.  Eyes: Conjunctivae and EOM are normal. Pupils are equal, round, and reactive to light. Right eye exhibits no discharge. Left eye exhibits no discharge.  Neck: Normal range  of motion. Neck supple.  TTP of the L perispinous muscles. No midline TTP of the C-spine  Cardiovascular: Normal rate, regular rhythm, normal heart sounds and intact distal pulses.  Exam reveals no gallop and no friction rub.   No murmur heard. Pulmonary/Chest: Effort normal and breath sounds normal. No respiratory distress. She has no wheezes. She has no rales. She exhibits no tenderness.  Abdominal: Soft. Bowel sounds are normal. She exhibits no distension. There is no tenderness. There is no guarding.  Musculoskeletal: Normal range of motion. She exhibits tenderness (TTP of the perispinous muscles of the thoracic spine. No midline TTP over the T or L spine. ). She exhibits no edema.  Neurological: She is alert and oriented to person, place, and time. She exhibits normal muscle tone.  Face symmetric, tongue midline. 5/5 strength in the proximal and distal upper and lower extremities bilaterally, with intact sensation to light touch. Normal finger to nose, heel to shin, and rapid alternating movements. Normal speech. normal gait.    Skin: Skin is warm and dry. No rash noted. She is not diaphoretic.  Psychiatric: She has a normal mood and affect. Her behavior is normal. Judgment and thought content normal.     ED Treatments / Results  Labs (all labs ordered are listed, but only abnormal results are displayed) Labs Reviewed  BASIC METABOLIC PANEL - Abnormal; Notable for the following:       Result Value   Creatinine, Ser 1.09 (*)    GFR calc non Af Amer 52 (*)    GFR calc Af Amer 60 (*)    All other components within normal limits  CBC - Abnormal; Notable for the following:    RBC 3.50 (*)    Hemoglobin 9.9 (*)    HCT 32.9 (*)  RDW 21.8 (*)    All other components within normal limits  I-STAT TROPOININ, ED  I-STAT TROPOININ, ED  I-STAT TROPOININ, ED    EKG  EKG Interpretation  Date/Time:  Friday April 18 2016 21:02:16 EST Ventricular Rate:  87 PR Interval:    QRS  Duration: 114 QT Interval:  412 QTC Calculation: 496 R Axis:   43 Text Interpretation:  Ectopic atrial rhythm Ventricular premature complex Incomplete right bundle branch block Low voltage, precordial leads No significant change since last tracing Confirmed by KNOTT MD, DANIEL [phone]) on 04/18/2016 11:09:50 PM       Radiology Dg Chest 2 View  Result Date: 04/18/2016 CLINICAL DATA:  Mid chest pain x2 hours for chest swelling. EXAM: CHEST  2 VIEW COMPARISON:  06/27/2015 FINDINGS: Stable cardiomegaly. The aorta is atherosclerotic without aneurysm. Mild interstitial edema without pneumonic consolidation, effusion or pneumothorax. The patient is status post median sternotomy. Old right-sided rib fractures are noted with healing. Plate and screw fixation of the mid clavicle. Vascular clips are seen at along the right side of the neck. IMPRESSION: Mild interstitial edema. Aortic atherosclerosis. Multiple chronic right-sided rib fractures. Plate and screw fixation of the mid right clavicle. Electronically Signed   By: Ashley Royalty M.D.   On: 04/18/2016 22:09   Ct Head Wo Contrast  Result Date: 04/18/2016 CLINICAL DATA:  Headache and left upper extremity pain for 2 weeks. EXAM: CT HEAD WITHOUT CONTRAST TECHNIQUE: Contiguous axial images were obtained from the base of the skull through the vertex without intravenous contrast. COMPARISON:  01/07/2013 FINDINGS: Brain: There is no intracranial hemorrhage, mass or evidence of acute infarction. There is no extra-axial fluid collection. Gray matter and white matter appear normal. Cerebral volume is normal for age. Brainstem and posterior fossa are unremarkable. The CSF spaces appear normal. Vascular: No hyperdense vessel or unexpected calcification. Skull: Normal. Negative for fracture or focal lesion. Sinuses/Orbits: No acute finding. Other: None. IMPRESSION: Normal brain Electronically Signed   By: Andreas Newport M.D.   On: 04/18/2016 22:11     Procedures Procedures (including critical care time)  Medications Ordered in ED Medications  valproate (DEPACON) 500 mg in dextrose 5 % 50 mL IVPB (500 mg Intravenous New Bag/Given 04/19/16 0007)  metoCLOPramide (REGLAN) injection 10 mg (10 mg Intravenous Given 04/18/16 2228)  diphenhydrAMINE (BENADRYL) injection 12.5 mg (12.5 mg Intravenous Given 04/18/16 2229)  sodium chloride 0.9 % bolus 500 mL (0 mLs Intravenous Stopped 04/18/16 2334)     Initial Impression / Assessment and Plan / ED Course  I have reviewed the triage vital signs and the nursing notes.  Pertinent labs & imaging results that were available during my care of the patient were reviewed by me and considered in my medical decision making (see chart for details).     Generally well-appearing. Afebrile and hemodynamically stable. Patient endorses diffuse pain, specifically in her head, neck, chest, abdomen, and extremities. Pain in her chest has resolved. Patient's EKG is grossly unchanged from prior as above. First troponin is 0.07. Patient has a high risk HEAR score of 7. Aspirin given and will admit for rule out given comorbidities, pressure nature of the pain, and accompanying nausea that has since resolved.  Electrolytes are within normal limits, and I doubt electrolyte abnormalities as the cause of the patient's diffuse cramping. CT head unremarkable, and I have a low suspicion for intracranial abnormality as the cause of the patient's headache. Her neuro exam is unremarkable as above and I doubt TIA or  CVA. Patient given Reglan and Depakote as migraine cocktail, with improvement in her headache. Remainder of labs at baseline. Patient is admitted to the internal medicine residency teaching service for further management of her symptoms. She was admitted in stable condition.  Care of pt overseen by my attending, Dr. Laneta Simmers.  Final Clinical Impressions(s) / ED Diagnoses   Final diagnoses:  None    New Prescriptions New  Prescriptions   No medications on file     Jenifer Algernon Huxley, MD 04/19/16 0030    Leo Grosser, MD 04/19/16 [phone]

## 2016-04-19 ENCOUNTER — Encounter (HOSPITAL_COMMUNITY): Payer: PPO

## 2016-04-19 ENCOUNTER — Encounter (HOSPITAL_COMMUNITY): Payer: Self-pay | Admitting: Nurse Practitioner

## 2016-04-19 DIAGNOSIS — R072 Precordial pain: Secondary | ICD-10-CM | POA: Diagnosis not present

## 2016-04-19 DIAGNOSIS — Z79899 Other long term (current) drug therapy: Secondary | ICD-10-CM

## 2016-04-19 DIAGNOSIS — I213 ST elevation (STEMI) myocardial infarction of unspecified site: Secondary | ICD-10-CM | POA: Diagnosis not present

## 2016-04-19 DIAGNOSIS — F418 Other specified anxiety disorders: Secondary | ICD-10-CM | POA: Diagnosis not present

## 2016-04-19 DIAGNOSIS — G4733 Obstructive sleep apnea (adult) (pediatric): Secondary | ICD-10-CM | POA: Diagnosis not present

## 2016-04-19 DIAGNOSIS — I21A1 Myocardial infarction type 2: Secondary | ICD-10-CM | POA: Diagnosis not present

## 2016-04-19 DIAGNOSIS — C642 Malignant neoplasm of left kidney, except renal pelvis: Secondary | ICD-10-CM | POA: Diagnosis not present

## 2016-04-19 DIAGNOSIS — I214 Non-ST elevation (NSTEMI) myocardial infarction: Secondary | ICD-10-CM | POA: Diagnosis not present

## 2016-04-19 DIAGNOSIS — G8929 Other chronic pain: Secondary | ICD-10-CM | POA: Diagnosis not present

## 2016-04-19 DIAGNOSIS — K219 Gastro-esophageal reflux disease without esophagitis: Secondary | ICD-10-CM | POA: Diagnosis not present

## 2016-04-19 DIAGNOSIS — R079 Chest pain, unspecified: Secondary | ICD-10-CM

## 2016-04-19 DIAGNOSIS — I480 Paroxysmal atrial fibrillation: Secondary | ICD-10-CM | POA: Diagnosis not present

## 2016-04-19 DIAGNOSIS — E119 Type 2 diabetes mellitus without complications: Secondary | ICD-10-CM | POA: Diagnosis not present

## 2016-04-19 DIAGNOSIS — D638 Anemia in other chronic diseases classified elsewhere: Secondary | ICD-10-CM | POA: Diagnosis not present

## 2016-04-19 DIAGNOSIS — H53149 Visual discomfort, unspecified: Secondary | ICD-10-CM | POA: Diagnosis not present

## 2016-04-19 DIAGNOSIS — I2510 Atherosclerotic heart disease of native coronary artery without angina pectoris: Secondary | ICD-10-CM | POA: Diagnosis not present

## 2016-04-19 DIAGNOSIS — E1140 Type 2 diabetes mellitus with diabetic neuropathy, unspecified: Secondary | ICD-10-CM

## 2016-04-19 DIAGNOSIS — Z6835 Body mass index (BMI) 35.0-35.9, adult: Secondary | ICD-10-CM | POA: Diagnosis not present

## 2016-04-19 DIAGNOSIS — I272 Pulmonary hypertension, unspecified: Secondary | ICD-10-CM | POA: Diagnosis not present

## 2016-04-19 DIAGNOSIS — D509 Iron deficiency anemia, unspecified: Secondary | ICD-10-CM | POA: Diagnosis not present

## 2016-04-19 DIAGNOSIS — Z79891 Long term (current) use of opiate analgesic: Secondary | ICD-10-CM

## 2016-04-19 DIAGNOSIS — Z8719 Personal history of other diseases of the digestive system: Secondary | ICD-10-CM | POA: Diagnosis not present

## 2016-04-19 DIAGNOSIS — I2721 Secondary pulmonary arterial hypertension: Secondary | ICD-10-CM | POA: Diagnosis not present

## 2016-04-19 DIAGNOSIS — F17210 Nicotine dependence, cigarettes, uncomplicated: Secondary | ICD-10-CM | POA: Diagnosis not present

## 2016-04-19 DIAGNOSIS — Z9889 Other specified postprocedural states: Secondary | ICD-10-CM | POA: Diagnosis not present

## 2016-04-19 DIAGNOSIS — D500 Iron deficiency anemia secondary to blood loss (chronic): Secondary | ICD-10-CM | POA: Diagnosis not present

## 2016-04-19 DIAGNOSIS — F329 Major depressive disorder, single episode, unspecified: Secondary | ICD-10-CM | POA: Diagnosis not present

## 2016-04-19 DIAGNOSIS — Z8269 Family history of other diseases of the musculoskeletal system and connective tissue: Secondary | ICD-10-CM

## 2016-04-19 DIAGNOSIS — R51 Headache: Secondary | ICD-10-CM | POA: Diagnosis not present

## 2016-04-19 DIAGNOSIS — I50810 Right heart failure, unspecified: Secondary | ICD-10-CM | POA: Diagnosis not present

## 2016-04-19 DIAGNOSIS — R2681 Unsteadiness on feet: Secondary | ICD-10-CM | POA: Diagnosis not present

## 2016-04-19 DIAGNOSIS — K922 Gastrointestinal hemorrhage, unspecified: Secondary | ICD-10-CM | POA: Diagnosis not present

## 2016-04-19 DIAGNOSIS — Z952 Presence of prosthetic heart valve: Secondary | ICD-10-CM | POA: Diagnosis not present

## 2016-04-19 DIAGNOSIS — I5082 Biventricular heart failure: Secondary | ICD-10-CM | POA: Diagnosis not present

## 2016-04-19 DIAGNOSIS — M6281 Muscle weakness (generalized): Secondary | ICD-10-CM | POA: Diagnosis not present

## 2016-04-19 DIAGNOSIS — Z9981 Dependence on supplemental oxygen: Secondary | ICD-10-CM | POA: Diagnosis not present

## 2016-04-19 DIAGNOSIS — D62 Acute posthemorrhagic anemia: Secondary | ICD-10-CM | POA: Diagnosis not present

## 2016-04-19 DIAGNOSIS — I5032 Chronic diastolic (congestive) heart failure: Secondary | ICD-10-CM

## 2016-04-19 DIAGNOSIS — J439 Emphysema, unspecified: Secondary | ICD-10-CM | POA: Diagnosis not present

## 2016-04-19 DIAGNOSIS — Z8249 Family history of ischemic heart disease and other diseases of the circulatory system: Secondary | ICD-10-CM

## 2016-04-19 DIAGNOSIS — I5033 Acute on chronic diastolic (congestive) heart failure: Secondary | ICD-10-CM | POA: Diagnosis not present

## 2016-04-19 DIAGNOSIS — Z7982 Long term (current) use of aspirin: Secondary | ICD-10-CM | POA: Diagnosis not present

## 2016-04-19 DIAGNOSIS — I2729 Other secondary pulmonary hypertension: Secondary | ICD-10-CM | POA: Diagnosis not present

## 2016-04-19 DIAGNOSIS — J449 Chronic obstructive pulmonary disease, unspecified: Secondary | ICD-10-CM | POA: Diagnosis not present

## 2016-04-19 DIAGNOSIS — F419 Anxiety disorder, unspecified: Secondary | ICD-10-CM | POA: Diagnosis not present

## 2016-04-19 DIAGNOSIS — E7800 Pure hypercholesterolemia, unspecified: Secondary | ICD-10-CM

## 2016-04-19 DIAGNOSIS — I509 Heart failure, unspecified: Secondary | ICD-10-CM | POA: Diagnosis not present

## 2016-04-19 DIAGNOSIS — Z953 Presence of xenogenic heart valve: Secondary | ICD-10-CM | POA: Diagnosis not present

## 2016-04-19 DIAGNOSIS — I493 Ventricular premature depolarization: Secondary | ICD-10-CM | POA: Diagnosis not present

## 2016-04-19 DIAGNOSIS — R278 Other lack of coordination: Secondary | ICD-10-CM | POA: Diagnosis not present

## 2016-04-19 DIAGNOSIS — M797 Fibromyalgia: Secondary | ICD-10-CM | POA: Diagnosis not present

## 2016-04-19 DIAGNOSIS — E785 Hyperlipidemia, unspecified: Secondary | ICD-10-CM | POA: Diagnosis not present

## 2016-04-19 DIAGNOSIS — K449 Diaphragmatic hernia without obstruction or gangrene: Secondary | ICD-10-CM | POA: Diagnosis not present

## 2016-04-19 DIAGNOSIS — R05 Cough: Secondary | ICD-10-CM | POA: Diagnosis not present

## 2016-04-19 DIAGNOSIS — J9611 Chronic respiratory failure with hypoxia: Secondary | ICD-10-CM | POA: Diagnosis not present

## 2016-04-19 DIAGNOSIS — E669 Obesity, unspecified: Secondary | ICD-10-CM | POA: Diagnosis not present

## 2016-04-19 LAB — BASIC METABOLIC PANEL
Anion gap: 12 (ref 5–15)
BUN: 14 mg/dL (ref 6–20)
CO2: 24 mmol/L (ref 22–32)
Calcium: 9.1 mg/dL (ref 8.9–10.3)
Chloride: 101 mmol/L (ref 101–111)
Creatinine, Ser: 1.01 mg/dL — ABNORMAL HIGH (ref 0.44–1.00)
GFR calc Af Amer: >60 mL/min (ref >60)
GFR calc non Af Amer: 57 mL/min — ABNORMAL LOW (ref >60)
Glucose, Bld: 202 mg/dL — ABNORMAL HIGH (ref 65–99)
Potassium: 4 mmol/L (ref 3.5–5.1)
Sodium: 137 mmol/L (ref 135–145)

## 2016-04-19 LAB — HEPARIN LEVEL (UNFRACTIONATED)
Heparin Unfractionated: <0.1 IU/mL — ABNORMAL LOW (ref 0.30–0.70)
Heparin Unfractionated: 0.13 IU/mL — ABNORMAL LOW (ref 0.30–0.70)

## 2016-04-19 LAB — GLUCOSE, CAPILLARY
Glucose-Capillary: 202 mg/dL — ABNORMAL HIGH (ref 65–99)
Glucose-Capillary: 233 mg/dL — ABNORMAL HIGH (ref 65–99)
Glucose-Capillary: 162 mg/dL — ABNORMAL HIGH (ref 65–99)
Glucose-Capillary: 269 mg/dL — ABNORMAL HIGH (ref 65–99)
Glucose-Capillary: 236 mg/dL — ABNORMAL HIGH (ref 65–99)

## 2016-04-19 LAB — D-DIMER, QUANTITATIVE: D-Dimer, Quant: 0.51 ug/mL-FEU — ABNORMAL HIGH (ref 0.00–0.50)

## 2016-04-19 LAB — LIPID PANEL
Cholesterol: 103 mg/dL (ref 0–200)
HDL: 28 mg/dL — ABNORMAL LOW (ref >40)
LDL Cholesterol: 29 mg/dL (ref 0–99)
Total CHOL/HDL Ratio: 3.7 RATIO
Triglycerides: 232 mg/dL — ABNORMAL HIGH (ref <150)
VLDL: 46 mg/dL — ABNORMAL HIGH (ref 0–40)

## 2016-04-19 LAB — CBC
HCT: 32.9 % — ABNORMAL LOW (ref 36.0–46.0)
Hemoglobin: 9.7 g/dL — ABNORMAL LOW (ref 12.0–15.0)
MCH: 27.5 pg (ref 26.0–34.0)
MCHC: 29.5 g/dL — ABNORMAL LOW (ref 30.0–36.0)
MCV: 93.2 fL (ref 78.0–100.0)
Platelets: 240 10*3/uL (ref 150–400)
RBC: 3.53 MIL/uL — ABNORMAL LOW (ref 3.87–5.11)
RDW: 21.2 % — ABNORMAL HIGH (ref 11.5–15.5)
WBC: 9.8 10*3/uL (ref 4.0–10.5)

## 2016-04-19 LAB — I-STAT TROPONIN, ED
Troponin i, poc: 0.77 ng/mL (ref 0.00–0.08)
Troponin i, poc: 2.26 ng/mL (ref 0.00–0.08)

## 2016-04-19 LAB — TROPONIN I
Troponin I: 2.9 ng/mL (ref <0.03)
Troponin I: 4.98 ng/mL (ref <0.03)

## 2016-04-19 MED ORDER — FLUTICASONE PROPIONATE 50 MCG/ACT NA SUSP
2.0000 | Freq: Every day | NASAL | Status: DC
  Administered 2016-04-19 – 2016-04-24 (×6): 2 via NASAL
  Filled 2016-04-19: qty 16

## 2016-04-19 MED ORDER — CYCLOBENZAPRINE HCL 5 MG PO TABS
5.0000 mg | ORAL_TABLET | Freq: Three times a day (TID) | ORAL | Status: DC | PRN
  Administered 2016-04-24: 5 mg via ORAL
  Filled 2016-04-19: qty 1

## 2016-04-19 MED ORDER — METOPROLOL TARTRATE 25 MG PO TABS
25.0000 mg | ORAL_TABLET | Freq: Two times a day (BID) | ORAL | Status: DC
  Administered 2016-04-19 – 2016-04-22 (×7): 25 mg via ORAL
  Filled 2016-04-19 (×7): qty 1

## 2016-04-19 MED ORDER — PROMETHAZINE HCL 25 MG PO TABS: 12.5000 mg | ORAL_TABLET | Freq: Three times a day (TID) | ORAL | Status: DC | PRN

## 2016-04-19 MED ORDER — GABAPENTIN 600 MG PO TABS
600.0000 mg | ORAL_TABLET | Freq: Three times a day (TID) | ORAL | Status: DC
  Administered 2016-04-19 – 2016-04-25 (×20): 600 mg via ORAL
  Filled 2016-04-19 (×20): qty 1

## 2016-04-19 MED ORDER — MOMETASONE FURO-FORMOTEROL FUM 200-5 MCG/ACT IN AERO
2.0000 | INHALATION_SPRAY | Freq: Two times a day (BID) | RESPIRATORY_TRACT | Status: DC
  Administered 2016-04-19 – 2016-04-24 (×11): 2 via RESPIRATORY_TRACT
  Filled 2016-04-19 (×4): qty 8.8

## 2016-04-19 MED ORDER — HEPARIN BOLUS VIA INFUSION
2000.0000 [IU] | Freq: Once | INTRAVENOUS | Status: AC
  Administered 2016-04-19: 2000 [IU] via INTRAVENOUS
  Filled 2016-04-19: qty 2000

## 2016-04-19 MED ORDER — FUROSEMIDE 80 MG PO TABS: 80.0000 mg | ORAL_TABLET | Freq: Two times a day (BID) | ORAL | Status: DC

## 2016-04-19 MED ORDER — POTASSIUM CHLORIDE CRYS ER 20 MEQ PO TBCR: 20.0000 meq | EXTENDED_RELEASE_TABLET | Freq: Two times a day (BID) | ORAL | Status: DC

## 2016-04-19 MED ORDER — TIOTROPIUM BROMIDE MONOHYDRATE 18 MCG IN CAPS
18.0000 ug | ORAL_CAPSULE | Freq: Every day | RESPIRATORY_TRACT | Status: DC
  Administered 2016-04-19 – 2016-04-24 (×6): 18 ug via RESPIRATORY_TRACT
  Filled 2016-04-19 (×3): qty 5

## 2016-04-19 MED ORDER — ACETAMINOPHEN 325 MG PO TABS: 650.0000 mg | ORAL_TABLET | ORAL | Status: DC | PRN

## 2016-04-19 MED ORDER — ALBUTEROL SULFATE (2.5 MG/3ML) 0.083% IN NEBU
2.5000 mg | INHALATION_SOLUTION | Freq: Four times a day (QID) | RESPIRATORY_TRACT | Status: DC | PRN
  Administered 2016-04-19: 2.5 mg via RESPIRATORY_TRACT
  Filled 2016-04-19: qty 3

## 2016-04-19 MED ORDER — FLUOXETINE HCL 20 MG PO CAPS
40.0000 mg | ORAL_CAPSULE | Freq: Every day | ORAL | Status: DC
  Administered 2016-04-19 – 2016-04-25 (×7): 40 mg via ORAL
  Filled 2016-04-19 (×7): qty 2

## 2016-04-19 MED ORDER — ENOXAPARIN SODIUM 40 MG/0.4ML ~~LOC~~ SOLN: 40.0000 mg | SUBCUTANEOUS | Status: DC

## 2016-04-19 MED ORDER — NICOTINE 21 MG/24HR TD PT24
21.0000 mg | MEDICATED_PATCH | Freq: Every day | TRANSDERMAL | Status: DC
  Administered 2016-04-19 – 2016-04-25 (×7): 21 mg via TRANSDERMAL
  Filled 2016-04-19 (×7): qty 1

## 2016-04-19 MED ORDER — FERROUS GLUCONATE 324 (38 FE) MG PO TABS
324.0000 mg | ORAL_TABLET | Freq: Two times a day (BID) | ORAL | Status: DC
  Administered 2016-04-19 – 2016-04-22 (×6): 324 mg via ORAL
  Filled 2016-04-19 (×10): qty 1

## 2016-04-19 MED ORDER — LIDOCAINE HCL 2 % EX GEL: Freq: Three times a day (TID) | CUTANEOUS | Status: DC | PRN

## 2016-04-19 MED ORDER — HEPARIN BOLUS VIA INFUSION
1000.0000 [IU] | Freq: Once | INTRAVENOUS | Status: AC
  Administered 2016-04-19: 1000 [IU] via INTRAVENOUS
  Filled 2016-04-19: qty 1000

## 2016-04-19 MED ORDER — ALPRAZOLAM 0.5 MG PO TABS: 1.0000 mg | ORAL_TABLET | Freq: Every day | ORAL | Status: DC

## 2016-04-19 MED ORDER — ONDANSETRON HCL 4 MG/2ML IJ SOLN: 4.0000 mg | Freq: Four times a day (QID) | INTRAMUSCULAR | Status: DC | PRN

## 2016-04-19 MED ORDER — ASPIRIN EC 325 MG PO TBEC
325.0000 mg | DELAYED_RELEASE_TABLET | Freq: Once | ORAL | Status: AC
  Administered 2016-04-19: 325 mg via ORAL
  Filled 2016-04-19: qty 1

## 2016-04-19 MED ORDER — HEPARIN (PORCINE) IN NACL 100-0.45 UNIT/ML-% IJ SOLN
1600.0000 [IU]/h | INTRAMUSCULAR | Status: DC
  Administered 2016-04-19: 1200 [IU]/h via INTRAVENOUS
  Administered 2016-04-20: 1500 [IU]/h via INTRAVENOUS
  Administered 2016-04-21: 1600 [IU]/h via INTRAVENOUS
  Filled 2016-04-19 (×3): qty 250

## 2016-04-19 MED ORDER — INSULIN ASPART 100 UNIT/ML ~~LOC~~ SOLN
0.0000 [IU] | Freq: Three times a day (TID) | SUBCUTANEOUS | Status: DC
  Administered 2016-04-19: 5 [IU] via SUBCUTANEOUS
  Administered 2016-04-19: 8 [IU] via SUBCUTANEOUS
  Administered 2016-04-19: 3 [IU] via SUBCUTANEOUS
  Administered 2016-04-20 (×2): 5 [IU] via SUBCUTANEOUS
  Administered 2016-04-20: 8 [IU] via SUBCUTANEOUS
  Administered 2016-04-21 (×3): 3 [IU] via SUBCUTANEOUS
  Administered 2016-04-22: 8 [IU] via SUBCUTANEOUS
  Administered 2016-04-22 (×2): 3 [IU] via SUBCUTANEOUS

## 2016-04-19 MED ORDER — HEPARIN BOLUS VIA INFUSION
4000.0000 [IU] | Freq: Once | INTRAVENOUS | Status: AC
  Administered 2016-04-19: 4000 [IU] via INTRAVENOUS
  Filled 2016-04-19: qty 4000

## 2016-04-19 MED ORDER — HEPARIN (PORCINE) IN NACL 100-0.45 UNIT/ML-% IJ SOLN
1000.0000 [IU]/h | INTRAMUSCULAR | Status: DC
  Administered 2016-04-19: 850 [IU]/h via INTRAVENOUS
  Filled 2016-04-19: qty 250

## 2016-04-19 MED ORDER — INSULIN DETEMIR 100 UNIT/ML ~~LOC~~ SOLN
20.0000 [IU] | Freq: Every day | SUBCUTANEOUS | Status: DC
  Administered 2016-04-19: 20 [IU] via SUBCUTANEOUS
  Filled 2016-04-19 (×2): qty 0.2

## 2016-04-19 MED ORDER — ALPRAZOLAM 0.5 MG PO TABS
1.0000 mg | ORAL_TABLET | Freq: Two times a day (BID) | ORAL | Status: DC | PRN
  Administered 2016-04-19 – 2016-04-25 (×7): 1 mg via ORAL
  Filled 2016-04-19 (×8): qty 2

## 2016-04-19 MED ORDER — ROSUVASTATIN CALCIUM 20 MG PO TABS
20.0000 mg | ORAL_TABLET | Freq: Every day | ORAL | Status: DC
  Administered 2016-04-19 – 2016-04-24 (×6): 20 mg via ORAL
  Filled 2016-04-19 (×6): qty 1

## 2016-04-19 MED ORDER — INSULIN ASPART 100 UNIT/ML ~~LOC~~ SOLN: 8.0000 [IU] | Freq: Three times a day (TID) | SUBCUTANEOUS | Status: DC

## 2016-04-19 MED ORDER — HYDROCODONE-ACETAMINOPHEN 5-325 MG PO TABS
1.0000 | ORAL_TABLET | Freq: Four times a day (QID) | ORAL | Status: DC | PRN
  Administered 2016-04-19 – 2016-04-20 (×4): 2 via ORAL
  Administered 2016-04-20 (×2): 1 via ORAL
  Administered 2016-04-21 – 2016-04-22 (×2): 2 via ORAL
  Administered 2016-04-22: 1 via ORAL
  Administered 2016-04-23 – 2016-04-25 (×4): 2 via ORAL
  Filled 2016-04-19 (×2): qty 2
  Filled 2016-04-19: qty 1
  Filled 2016-04-19 (×6): qty 2
  Filled 2016-04-19: qty 1
  Filled 2016-04-19: qty 2
  Filled 2016-04-19: qty 1
  Filled 2016-04-19 (×2): qty 2

## 2016-04-19 MED ORDER — ASPIRIN EC 325 MG PO TBEC
325.0000 mg | DELAYED_RELEASE_TABLET | Freq: Every day | ORAL | Status: DC
  Administered 2016-04-20 – 2016-04-21 (×2): 325 mg via ORAL
  Filled 2016-04-19 (×2): qty 1

## 2016-04-19 MED ORDER — PANTOPRAZOLE SODIUM 40 MG PO TBEC
80.0000 mg | DELAYED_RELEASE_TABLET | Freq: Every day | ORAL | Status: DC
  Administered 2016-04-19 – 2016-04-20 (×2): 80 mg via ORAL
  Filled 2016-04-19 (×2): qty 2

## 2016-04-19 NOTE — Progress Notes (Signed)
NIV set up. Pt states that she doesn't want to wear it right now. Education given to patient on the proper use of the machine

## 2016-04-19 NOTE — Progress Notes (Signed)
Page to [employer] (weekends) to notify of 4 beats of vtach and patient's curiosity about the 'pending', but not yet ordered, cardiology consult.  Response, Message to be relayed to primary.

## 2016-04-19 NOTE — Progress Notes (Signed)
ANTICOAGULATION CONSULT NOTE - Follow-Up  Pharmacy Consult for Heparin Indication: chest pain/ACS  Allergies  Allergen Reactions  . Morphine And Related Other (See Comments)    Injection site reaction  . Oxycontin [Oxycodone] Other (See Comments)    headache  . Tramadol Hcl Swelling    Ankle swelling  . Lorazepam Other (See Comments)    Patient's sister noted that ativan caused the patient to become extremely confused during hospitalization 09/2010; tolerates Xanax    Patient Measurements: Height: 5' 2" (157.5 cm) Weight: 189 lb 1.6 oz (85.8 kg) IBW/kg (Calculated) : 50.1 Heparin Dosing Weight: 70 kg  Vital Signs: Temp: 97.6 F (36.4 C) (03/10 1958) Temp Source: Oral (03/10 1958) BP: 135/52 (03/10 1958) Pulse Rate: 63 (03/10 1958)  Labs:  Recent Labs  04/18/16 2208 04/19/16 0428 04/19/16 1039 04/19/16 2041  HGB 9.9* 9.7*  --   --   HCT 32.9* 32.9*  --   --   PLT 215 240  --   --   HEPARINUNFRC  --   --  <0.10* 0.13*  CREATININE 1.09* 1.01*  --   --   TROPONINI  --  2.90* 4.98*  --     Estimated Creatinine Clearance: 55.7 mL/min (by C-G formula based on SCr of 1.01 mg/dL (H)).   Assessment: 66 y.o. F with troponin up to 2.26 on heparin for ACS/NSTEMI. No AC PTA. 6h Heparin level = 0.13 on 1000 units/hr.,  Subtherapeutic level.. No bleeding noted. CBC is low but stable.   Goal of Therapy:  Heparin level 0.3-0.7 units/ml Monitor platelets by anticoagulation protocol: Yes   Plan:  Heparin 2000 unit bolus  Increase heparin to 1200 units/hr  Will f/u heparin level in 6 hours Daily heparin level and CBC   Nicole Cella, RPh Clinical Pharmacist Pager: [phone] 04/19/2016,9:44 PM

## 2016-04-19 NOTE — Progress Notes (Signed)
   04/19/16 0433  Vitals  Temp 98.4 F (36.9 C)  Temp Source Oral  BP (!) 186/43  BP Location Left Arm  BP Method Automatic  Patient Position (if appropriate) Lying  Pulse Rate 84  Pulse Rate Source Dinamap  Resp (!) 24  Oxygen Therapy  SpO2 93 %  O2 Device Room Air  Height and Weight  Height _0  (1.575 m)  Weight 85.8 kg (189 lb 1.6 oz)  Type of Scale Used Standing (scale c)  Type of Weight Actual  BSA (Calculated - sq m) 1.94 sq meters  BMI (Calculated) 34.7  Weight in (lb) to have BMI = 25 136.4  Admitted pt to rm 3E16 from ED, pt alert and oriented, placed on bed comfortably, pt placed on cardiac monitor, CCMD made aware. Pt oriented on patients safety plan, verbalized understanding. Will continue to monitor.

## 2016-04-19 NOTE — Consult Note (Addendum)
Patient ID: [name] MRN: [id number], DOB/AGE: 03-12-1950   Admit date: 04/18/2016   Reason for Consult: NSTEMI Requesting MD: Dr. Evette Doffing, Internal Medicine    Primary Physician: Karren Cobble, MD Primary Cardiologist: Dr. Stanford Breed  Pt. Profile:  66 y/o obese female with h/o mitral valve replacement in 2012, h/o PAF, previously on coumadin, recent GIB and coumadin stopped. Also with IDDM and long h/o tobacco abuse, admitted for chest and left arm pain. Found to have an elevated troponin w/c NSTEMI.   Problem List  Past Medical History:  Diagnosis Date  . Adenomatous polyps 05/14/2011   Colonoscopy (05/2011): 4 mm adenomatous polyp excised endoscopically Colonoscopy (02/2002): Adenomatous polyp excised endoscopically   . Allergic rhinitis 06/01/2012  . Anemia of chronic disease 01/01/2013  . Anxiety 07/24/2010  . Aortic atherosclerosis ([place]) 10/19/2014   Seen on CT scan, currently asymptomatic  . 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 Asymptomatic cholelithiasis 09/25/2015   Seen on CT scan 08/2015  . Bilateral cataracts 08/04/2012   Visually insignificant   . Carotid artery stenosis    s/p right endarterectomy (06/2010) Carotid US (07/2010):  Left: Moderate-to-severe (60-79%) calcific and non-calcific plaque origin and proximal ICA and ECA   . Chronic congestive heart failure with left ventricular diastolic dysfunction ([place]) 10/21/2010  . Chronic constipation 02/03/2011  . Chronic daily headache 01/16/2014  . Chronic low back pain 10/06/2012  . Chronic venous insufficiency 08/04/2012  . Clear cell renal cell carcinoma ([place]) 07/21/2011   s/p cryoablation of left RCC in 09/2011 by [name]. Followed by [name]  [facility]) .    Marland Kitchen COPD (chronic obstructive pulmonary disease) with emphysema (HCC)    PFTs 02/2012: FEV1 0.92 (40%), ratio 69, 27% increase in FEV1 with BD, TLC  91%, severe airtrapping, DLCO49% On chronic home O2. Pulmonary rehab referral 05/2012   . Depression 11/19/2005  . Esophageal stenosis 08/08/2015   Mild, benign-appearing on EGD 06/29/2015  . Fibromyalgia 08/29/2010  . Gastroesophageal reflux disease   . History of blood transfusion    "several times"   . Hyperlipidemia LDL goal < 100 11/20/2005  . Internal hemorrhoids 08/04/2012  . Mitral stenosis    s/p Mitral valve replacement with a 27-mm pericardial porcine valve (Medtronic Mosaic valve, serial #[id number] on 09/20/10, Dr. Prescott Gum)   . Moderate to severe pulmonary hypertension    2014 TEE w PA peak pressure 46 mmHg, s/p MV replacement   . Obesity (BMI 30.0-34.9) 10/23/2011  . Obstructive sleep apnea    Nocturnal polysomnography (06/2009): Moderate sleep apnea/ hypopnea syndrome , AHI 17.8 per hour with nonpositional hypopneas. CPAP titration to 12 CWP, AHI 2.4 per hour. On nocturnal CPAP via a small resMed Quattro full-face mask with heated humidifier.   . Osteoporosis    DEXA (12/09/2011): L-spine T -3.7, left hip T -1.4 DEXA (12/2004): L-spine T -2.6, left hip -0.1   . Paroxysmal atrial fibrillation ([place]) 10/22/2010   s/p Left atrial maze procedure for paroxysmal atrial fibrillation on 09/20/2010 by Dr Prescott Gum.  Subsequent splenic infarct, decision was made to re-anticoagulate with coumadin, likely life-long as this is the most likely cause of the splenic infarct.   . Right nephrolithiasis 09/06/2014   5 mm non-obstructing calculus seen on CT scan 09/05/2014   . Seborrheic keratosis 09/28/2015  . Shortness of breath dyspnea   . Tobacco abuse 07/28/2012  . Type 2 diabetes mellitus with diabetic  neuropathy [facility])     Past Surgical History:  Procedure Laterality Date  . CARDIAC VALVE REPLACEMENT  Aug. 2012   "mitral valve"  . CAROTID ENDARTERECTOMY Right 07/04/2010   by [name] for asymptomatic right carotid artery stenosis  . CHEST TUBE INSERTION  09/24/2010   Dr Prescott Gum  .  COLONOSCOPY  05/12/2011   performed by Dr. Michail Sermon. Showing small internal hemorrhoids, single tubular adenoma polyp  . CRYOABLATION Left 09/2011   by [name]. Followed by [name]  [facility]) .    Marland Kitchen DILATION AND CURETTAGE OF UTERUS    . ESOPHAGOGASTRODUODENOSCOPY  05/12/2011   performed by Dr. Michail Sermon. Negative for ulcerations, biopsy negative for evidence of celiac sprue  . ESOPHAGOGASTRODUODENOSCOPY N/A 06/29/2015   Procedure: ESOPHAGOGASTRODUODENOSCOPY (EGD);  Surgeon: Clarene Essex, MD;  Location: [facility] ENDOSCOPY;  Service: Endoscopy;  Laterality: N/A;  . ESOPHAGOGASTRODUODENOSCOPY N/A 03/29/2016   Procedure: ESOPHAGOGASTRODUODENOSCOPY (EGD);  Surgeon: Clarene Essex, MD;  Location: [facility] ENDOSCOPY;  Service: Endoscopy;  Laterality: N/A;  . FRACTURE SURGERY Right 2005   clavicle  . GIVENS CAPSULE STUDY N/A 06/30/2015   Procedure: GIVENS CAPSULE STUDY;  Surgeon: Clarene Essex, MD;  Location: [facility] ENDOSCOPY;  Service: Endoscopy;  Laterality: N/A;  . GIVENS CAPSULE STUDY N/A 06/29/2015   Procedure: GIVENS CAPSULE STUDY;  Surgeon: Clarene Essex, MD;  Location: [place];  Service: Endoscopy;  Laterality: N/A;  . Hysteroscopy with endometrial ablation  06/2001   for persistent post-menopausal bleeding // by S. [name], M.D.  . IR GENERIC HISTORICAL  08/23/2015   IR RADIOLOGIST EVAL & MGMT 08/23/2015 Aletta Edouard, MD [facility] INTERV RAD  . IR GENERIC HISTORICAL  04/09/2016   IR RADIOLOGIST EVAL & MGMT 04/09/2016 Aletta Edouard, MD [facility] INTERV RAD  . LIPOMA EXCISION  08/2005   occipital lipoma 1.5cm - by Dr. [name]  . MAZE Left 09/20/10   for paroxysmal atrial fibrillation (Dr. Prescott Gum)  . MITRAL VALVE REPLACEMENT  09/20/10    with a 27-mm pericardial porcine valve (Medtronic Mosaic valve, serial #[id number]). 09/20/10, Dr Prescott Gum  . ORIF CLAVICLE FRACTURE  01/2004   by Thana Farr. [name], M.D for Right clavicle nonunion.  . TONSILLECTOMY    . TUBAL LIGATION       Allergies  Allergies    Allergen Reactions  . Morphine And Related Other (See Comments)    Injection site reaction  . Oxycontin [Oxycodone] Other (See Comments)    headache  . Tramadol Hcl Swelling    Ankle swelling  . Lorazepam Other (See Comments)    Patient's sister noted that ativan caused the patient to become extremely confused during hospitalization 09/2010; tolerates Xanax    HPI  66 y/o obese female followed by Dr. Stanford Breed. She has a h/o mitral valve replacement with a porcine valve in 2012. Denies any history of ischemic heart disease or CVA. She is a smoker, as well as a diabetic. She is on insulin. She has been a smoker since the age of 68. She smokes 1ppd. She was recently admitted for a GIB due to a small intestine telangiectasia. She required 2 units of blood for transfusion. Coumadin was discontinued and she was placed on ASA. She was discharged on 03/29/16.  She presented back to the hospital yesterday with a complaint of SSCP and left arm pain. Occurred at rest and radiated to left arm. Felt like a pressure in her chest. No dyspnea but she was diaphoretic. Occurred off and on throughout the day, prompting her  to come to ED. POC troponin was 0.77. Repeat was 2.26. Actual lab troponin also abnormal at 2.90. She was admitted by IM and placed on IV heparin. She is CP free. Hgb is 9.7. EKG shows lateral [place] abnormalities.      Home Medications  Prior to Admission medications   Medication Sig Start Date End Date Taking? Authorizing Provider  albuterol (PROAIR HFA) 108 (90 Base) MCG/ACT inhaler Inhale 1-2 puffs into the lungs every 6 (six) hours as needed for shortness of breath. 11/14/15  Yes [name], MD  ALPRAZolam [name]) 1 MG tablet Take 0.5-1 tablets (0.5-1 mg total) by mouth 2 (two) times daily as needed for anxiety. Patient taking differently: Take 1 mg by mouth at bedtime.  02/13/16  Yes [name], MD  aspirin EC 325 MG tablet Take 1 tablet (325 mg total) by mouth daily. 03/29/16  Yes  Lorella Nimrod, MD  chlorpheniramine (CHLOR-TRIMETON) 4 MG tablet Take 1 tablet (4 mg total) by mouth 3 (three) times daily as needed for allergies. 10/06/14  Yes [name], MD  cyclobenzaprine (FLEXERIL) 5 MG tablet Take 1 tablet (5 mg total) by mouth 3 (three) times daily as needed for muscle spasms. 01/11/16  Yes [name], MD  ferrous gluconate (FERGON) 324 MG tablet Take 1 tablet (324 mg total) by mouth 2 (two) times daily with a meal. Patient taking differently: Take 324 mg by mouth daily.  11/16/15  Yes [name], MD  FLUoxetine (PROZAC) 20 MG tablet Take 2 tablets (40 mg total) by mouth daily. 12/14/15  Yes [name], MD  fluticasone (FLONASE) 50 MCG/ACT nasal spray Place 2 sprays into both nostrils daily. Patient taking differently: Place 2 sprays into both nostrils at bedtime.  02/08/15  Yes [name], MD  Fluticasone-Salmeterol (ADVAIR DISKUS) 500-50 MCG/DOSE AEPB Inhale 1 puff into the lungs 2 (two) times daily. 12/14/15  Yes [name], MD  furosemide (LASIX) 80 MG tablet Take 1 tablet (80 mg total) by mouth 2 (two) times daily. Patient taking differently: Take 80 mg by mouth 2 (two) times daily as needed for fluid.  06/28/15  Yes [name], MD  gabapentin (NEURONTIN) 300 MG capsule Take 2 capsules (600 mg total) by mouth 3 (three) times daily. 11/16/15  Yes [name], MD  HYDROcodone-acetaminophen (NORCO/VICODIN) 5-325 MG tablet Take 1-2 tablets by mouth every 6 (six) hours as needed for severe pain. Patient taking differently: Take 2 tablets by mouth at bedtime.  11/16/15  Yes [name], MD  insulin aspart (NOVOLOG) 100 UNIT/ML injection Inject 18-28 Units into the skin See admin instructions. Inject 18 units plus adjustment for CBG (sliding scale) - 3 times daily before meals   Yes Historical Provider, MD  Insulin Detemir (LEVEMIR FLEXTOUCH) 100 UNIT/ML Pen Inject 20 Units into the skin daily at 10 pm. Patient taking differently: Inject 45 Units into  the skin daily before breakfast.  02/13/16  Yes [name], MD  lidocaine (XYLOCAINE) 2 % jelly APPLY TO AFFECTED AREA THREE TIMES A DAY Patient taking differently: APPLY TO AFFECTED AREA THREE TIMES A DAY AS NEEDED FOR PAIN 09/19/15  Yes Sid Falcon, MD  nystatin cream (MYCOSTATIN) APPLY TO AFFECTED AREA TWICE DAILY Patient taking differently: APPLY TO AFFECTED AREA TWICE DAILY AS NEEDED FOR RASH 04/03/16  Yes [name], MD  omeprazole (PRILOSEC) 40 MG capsule Take 1 capsule (40 mg total) by mouth 2 (two) times daily. 08/22/15  Yes [name], MD  OXYGEN Inhale 2 L into the lungs  as needed (for shortness of breath while active).   Yes Historical Provider, MD  potassium chloride SA (K-DUR,KLOR-CON) 20 MEQ tablet TAKE 1 TABLET BY MOUTH TWICE DAILY Patient taking differently: TAKE 1 TABLET BY MOUTH TWICE DAILY AS NEEDED - WITH EACH DOSE OF LASIX 09/19/15  Yes Sid Falcon, MD  PRESCRIPTION MEDICATION Inhale into the lungs See admin instructions. CPAP - use whenever sleeping   Yes Historical Provider, MD  promethazine (PHENERGAN) 12.5 MG tablet Take 1 tablet (12.5 mg total) by mouth every 8 (eight) hours as needed for nausea or vomiting. 07/13/15  Yes Alex Ronnie Derby, DO  rosuvastatin (CRESTOR) 20 MG tablet TAKE 1 TABLET BY MOUTH EVERY NIGHT AT BEDTIME 09/19/15  Yes Sid Falcon, MD  SPIRIVA HANDIHALER 18 MCG inhalation capsule INHALE THE CONTENTS OF 1 CAPSULE VIA HANDIHALER BY MOUTH EVERY DAY 09/19/15  Yes Sid Falcon, MD  Blood Glucose Monitoring Suppl (ONETOUCH VERIO) W/DEVICE KIT 1 each by Does not apply route 4 (four) times daily. 07/25/14   [name], MD  EASY TOUCH PEN NEEDLES 31G X 5 MM [place] USE TO INJECT INSULIN 5 TIMES DAILY 07/26/15   [name], MD  Insulin Disposable Pump (V-GO 40) KIT FILL AND PLACE VGO40 ABOUT THE SAME TIME EACH DAY. USE 4 TO 5 CLICKS WITH MEALS AND 2 CLICKS WITH SNACKS Patient not taking: Reported on 04/18/2016 03/04/16   [name], MD  Insulin  Syringe-Needle U-100 31G X 15/64" 0.5 ML MISC Use to inject insulin up to 4 times a day 04/17/16   [name], MD  Lancets Misc. (ACCU-CHEK FASTCLIX LANCET) KIT Check your blood 4 times a day dx code 250.00 insulin requiring 01/19/13   [name], MD  NOVOLOG 100 UNIT/ML injection USE TO FILL VGO40 TO WEAR DAILY. Patient not taking: Reported on 04/18/2016 03/04/16   [name], MD  [facility] VERIO test strip Check blood sugar up to 4 times a day 02/28/15   [name], [place]  . ALPRAZolam  1 mg Oral QHS  . [START ON 04/20/2016] aspirin EC  325 mg Oral Daily  . ferrous gluconate  324 mg Oral BID WC  . FLUoxetine  40 mg Oral Daily  . fluticasone  2 spray Each Nare QHS  . gabapentin  600 mg Oral TID  . insulin aspart  0-15 Units Subcutaneous TID WC  . insulin detemir  20 Units Subcutaneous QHS  . mometasone-formoterol  2 puff Inhalation BID  . nicotine  21 mg Transdermal Daily  . pantoprazole  80 mg Oral Daily  . rosuvastatin  20 mg Oral QHS  . tiotropium  18 mcg Inhalation Daily   Family History  Family History  Problem Relation Age of Onset  . Peptic Ulcer Disease Father   . Heart attack Father 71    Died of MI at age 20  . Heart attack Brother 22    Died of MI at age 40  . Obesity Brother   . Pneumonia Mother   . Healthy Sister   . Lupus Daughter   . Obsessive Compulsive Disorder Daughter     Social History  Social History   Social History  . Marital status: Divorced    Spouse name: N/A  . Number of children: N/A  . Years of education: N/A   Occupational History  . Not on file.   Social History Main Topics  . Smoking status: Current Every Day Smoker    Packs/day: 1.00  Years: 53.00    Types: Cigarettes    Start date: 11/01/1962  . Smokeless tobacco: Never Used     Comment: 1/2 to 1 pack per day  . Alcohol use No  . Drug use: No  . Sexual activity: No   Other Topics Concern  . Not on file   Social History Narrative   Lives alone  in [facility]) with 60 pound chow   Worked at [employer] for 18 years   No car     Review of Systems General:  No chills, fever, night sweats or weight changes.  Cardiovascular:  No chest pain, dyspnea on exertion, edema, orthopnea, palpitations, paroxysmal nocturnal dyspnea. Dermatological: No rash, lesions/masses Respiratory: No cough, dyspnea Urologic: No hematuria, dysuria Abdominal:   No nausea, vomiting, diarrhea, bright red blood per rectum, melena, or hematemesis Neurologic:  No visual changes, wkns, changes in mental status. All other systems reviewed and are otherwise negative except as noted above.  Physical Exam  Blood pressure (!) 151/46, pulse 74, temperature 98.2 F (36.8 C), temperature source Oral, resp. rate 18, height 5' 2" (1.575 m), weight 189 lb 1.6 oz (85.8 kg), SpO2 98 %.  General: Pleasant, NAD Psych: Normal affect. Neuro: Alert and oriented X 3. Moves all extremities spontaneously. HEENT: Normal  Neck: Supple without bruits or JVD. Lungs:  Resp regular and unlabored, CTA. Heart: RRR no s3, s4, or murmurs. Abdomen: Soft, non-tender, non-distended, BS + x 4.  Extremities: No clubbing, cyanosis or edema. DP/PT/Radials 2+ and equal bilaterally.  Labs  Troponin [facility] of Care Test)  Recent Labs  04/19/16 0345  TROPIPOC 2.26*    Recent Labs  04/19/16 0428  TROPONINI 2.90*   Lab Results  Component Value Date   WBC 9.8 04/19/2016   HGB 9.7 (L) 04/19/2016   HCT 32.9 (L) 04/19/2016   MCV 93.2 04/19/2016   PLT 240 04/19/2016     Recent Labs Lab 04/19/16 0428  NA 137  K 4.0  CL 101  CO2 24  BUN 14  CREATININE 1.01*  CALCIUM 9.1  GLUCOSE 202*   Lab Results  Component Value Date   CHOL 103 01/14/2013   HDL 29 (L) 01/14/2013   LDLCALC 37 01/14/2013   TRIG 183 (H) 01/14/2013   Lab Results  Component Value Date   DDIMER 0.51 (H) 04/19/2016     Radiology/Studies  Dg Chest 2 View  Result Date:  04/18/2016 CLINICAL DATA:  Mid chest pain x2 hours for chest swelling. EXAM: CHEST  2 VIEW COMPARISON:  06/27/2015 FINDINGS: Stable cardiomegaly. The aorta is atherosclerotic without aneurysm. Mild interstitial edema without pneumonic consolidation, effusion or pneumothorax. The patient is status post median sternotomy. Old right-sided rib fractures are noted with healing. Plate and screw fixation of the mid clavicle. Vascular clips are seen at along the right side of the neck. IMPRESSION: Mild interstitial edema. Aortic atherosclerosis. Multiple chronic right-sided rib fractures. Plate and screw fixation of the mid right clavicle. Electronically Signed   By: Ashley Royalty M.D.   On: 04/18/2016 22:09   Ct Head Wo Contrast  Result Date: 04/18/2016 CLINICAL DATA:  Headache and left upper extremity pain for 2 weeks. EXAM: CT HEAD WITHOUT CONTRAST TECHNIQUE: Contiguous axial images were obtained from the base of the skull through the vertex without intravenous contrast. COMPARISON:  01/07/2013 FINDINGS: Brain: There is no intracranial hemorrhage, mass or evidence of acute infarction. There is no extra-axial fluid collection. Gray matter and white matter appear normal. Cerebral  volume is normal for age. Brainstem and posterior fossa are unremarkable. The CSF spaces appear normal. Vascular: No hyperdense vessel or unexpected calcification. Skull: Normal. Negative for fracture or focal lesion. Sinuses/Orbits: No acute finding. Other: None. IMPRESSION: Normal brain Electronically Signed   By: Andreas Newport M.D.   On: 04/18/2016 22:11   Ct Abdomen W Wo Contrast  Result Date: 03/28/2016 CLINICAL DATA:  Acute onset of generalized abdominal pain. Acute blood loss. Initial encounter. EXAM: CT ABDOMEN WITHOUT AND WITH CONTRAST TECHNIQUE: Multidetector CT imaging of the abdomen was performed following the standard protocol before and following the bolus administration of intravenous contrast. CONTRAST:  100 mL of Isovue  300 IV contrast COMPARISON:  CT of the abdomen performed 08/23/2015, and MRI of the lumbar spine performed 11/15/2015 FINDINGS: Lower chest: A bleb is noted at the right lung base. Scattered calcification is noted at the aortic and mitral valves, with postoperative change at the mitral valve. Scattered coronary artery calcifications are seen. Hepatobiliary: The liver is unremarkable in appearance. The gallbladder is unremarkable in appearance. The common bile duct remains normal in caliber. Trace fluid is seen about the liver. Pancreas: The pancreas is within normal limits. Spleen: The spleen is unremarkable in appearance. Adrenals/Urinary Tract: The adrenal glands are unremarkable in appearance. Post ablation changes at the posterior aspect of the lower pole of the left kidney are grossly unchanged from 2017. There is no evidence of recurrent mass, with a mixture fatty attenuation and soft tissue attenuation. Mild delayed enhancement at the small 1.2 cm soft tissue component along the posterior aspect of the ablation area is stable in appearance. A 6 mm nonobstructing stone is noted at the interpole region of the right kidney. Mild nonspecific perinephric stranding is noted bilaterally. There is no evidence of hydronephrosis. No obstructing ureteral stones are identified. No perinephric stranding is seen. Stomach/Bowel: Visualized small and large bowel loops are grossly unremarkable in appearance. The stomach is largely decompressed and unremarkable. Vascular/Lymphatic: Diffuse calcification is seen along the abdominal aorta and its branches. The abdominal aorta is otherwise grossly unremarkable. The inferior vena cava is grossly unremarkable. No retroperitoneal lymphadenopathy is seen. Other: There is question of mild soft tissue inflammation at the right lower quadrant. Would correlate clinically for any evidence of appendicitis, as the appendix is not imaged on this study. Musculoskeletal: No acute osseous  abnormalities are identified. The visualized musculature is unremarkable in appearance. IMPRESSION: 1. Stable appearance to post ablation change at the lower pole of the left kidney, with a small 1.2 cm focus of enhancing soft tissue again noted along the posterior aspect of the region, thought to reflect enhancing scar tissue. No evidence of local recurrence. 2. Question of mild soft tissue inflammation at the right lower quadrant, incompletely imaged on this study. Would correlate clinically for any evidence of appendicitis, as the appendix is not imaged on this study. 3. Diffuse aortic atherosclerosis. 4. 6 mm nonobstructing stone at the interpole region of the right kidney. 5. Bleb at the right lung base. Scattered calcification at the aortic and mitral valves. Scattered coronary artery calcifications seen. Electronically Signed   By: Garald Balding M.D.   On: 03/28/2016 01:53   Ir Radiologist Eval & Mgmt  Result Date: 04/18/2016 Please refer to notes tab for details about interventional procedure. (Op Note)   ECG  Sr with lateral T wave abnormalities.   ASSESSMENT AND PLAN  Principal Problem:   Chest pain Active Problems:   Hyperlipidemia   Major depression, chronic  Anxiety   Gastroesophageal reflux disease with hiatal hernia   Type 2 diabetes mellitus with diabetic neuropathy (HCC)   Obstructive sleep apnea   Fibromyalgia   Chronic congestive heart failure with left ventricular diastolic dysfunction (HCC)   Paroxysmal atrial fibrillation (HCC)   Anemia of chronic disease   1. NSTEMI: 66 y/o female with h/o IDDM and long h/o tobacco abuse with intermitted SSCP radiating to left arm, with elevated troponin c/w NSTEMI. Troponin trend 0.77>>2.26>>2.90. EKG w/o acute ST changes. She is CP free on IV heparin currently. Continue medical therapy for now, until we reach a decision regarding cath as pt was recently treated for a GIB last month. Her Hgb is currently in the 9 range. Will  discuss with MD. If we decide on cath, it will be early next week. No plans for cath over the weekend unless she develops severe, uncontrollable  chest pain or significant ST changes.    Signed, Lyda Jester, PA-C 04/19/2016, 12:38 PM   The patient was seen and examined, and I agree with the history, physical exam, assessment and plan as documented above, with modifications as noted below. 66 yr old woman with IDDM, tobacco abuse, bioprosthetic MVR, paroxysmal atrial fibrillation, and h/o GI bleed presented with chest pain and found to have NSTEMI, troponins up to 4.9. Coronaries normal in 2012.  Currently on IV heparin, ASA, and statin.  Still with episodic chest discomfort. Has not requested SL nitroglcycerin.  I will start metoprolol 25 mg bid. I will also order an echocardiogram to evaluate for interval changes in LV systolic function and/or regional wall motion. Hgb is stable. Will tentatively plan for coronary angiography on 04/21/16. Could potentially use bare metal stent and shorter duration of dual antiplatelet therapy.   Kate Sable, MD, [facility]  04/19/2016 1:00 PM

## 2016-04-19 NOTE — Progress Notes (Addendum)
Pt denies chest pain all shift. No other complaints since treatment for anxiety earlier this afternoon.  Pt has order for another xanax this evening/night if necessary.

## 2016-04-19 NOTE — Progress Notes (Signed)
Page to teaching services to request anxiety medication.   Per Boone Master, MD will address.

## 2016-04-19 NOTE — Progress Notes (Signed)
ANTICOAGULATION CONSULT NOTE - Initial Consult  Pharmacy Consult for Heparin Indication: chest pain/ACS  Allergies  Allergen Reactions  . Morphine And Related Other (See Comments)    Injection site reaction  . Oxycontin [Oxycodone] Other (See Comments)    headache  . Tramadol Hcl Swelling    Ankle swelling  . Lorazepam Other (See Comments)    Patient's sister noted that ativan caused the patient to become extremely confused during hospitalization 09/2010; tolerates Xanax    Patient Measurements: Height: _0  (157.5 cm) Weight: 196 lb (88.9 kg) IBW/kg (Calculated) : 50.1 Heparin Dosing Weight: 70 kg  Vital Signs: Temp: 98 F (36.7 C) (03/10 0302) Temp Source: Oral (03/09 2103) BP: 157/51 (03/10 0302) Pulse Rate: 81 (03/10 0230)  Labs:  Recent Labs  04/18/16 2208  HGB 9.9*  HCT 32.9*  PLT 215  CREATININE 1.09*    Estimated Creatinine Clearance: 52.6 mL/min (by C-G formula based on SCr of 1.09 mg/dL (H)).   Medical History: Past Medical History:  Diagnosis Date  . Adenomatous polyps 05/14/2011   Colonoscopy (05/2011): 4 mm adenomatous polyp excised endoscopically Colonoscopy (02/2002): Adenomatous polyp excised endoscopically   . Allergic rhinitis 06/01/2012  . Anemia of chronic disease 01/01/2013  . Anxiety 07/24/2010  . Aortic atherosclerosis ([place]) 10/19/2014   Seen on CT scan, currently asymptomatic  . 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 Asymptomatic cholelithiasis 09/25/2015   Seen on CT scan 08/2015  . Bilateral cataracts 08/04/2012   Visually insignificant   . Carotid artery stenosis    s/p right endarterectomy (06/2010) Carotid US (07/2010):  Left: Moderate-to-severe (60-79%) calcific and non-calcific plaque origin and proximal ICA and ECA   . Chronic congestive heart failure with left ventricular diastolic dysfunction ([place]) 10/21/2010  . Chronic constipation 02/03/2011   . Chronic daily headache 01/16/2014  . Chronic low back pain 10/06/2012  . Chronic venous insufficiency 08/04/2012  . Clear cell renal cell carcinoma ([place]) 07/21/2011   s/p cryoablation of left RCC in 09/2011 by [name]. Followed by [name]  [facility]) .    Marland Kitchen COPD (chronic obstructive pulmonary disease) with emphysema (HCC)    PFTs 02/2012: FEV1 0.92 (40%), ratio 69, 27% increase in FEV1 with BD, TLC 91%, severe airtrapping, DLCO49% On chronic home O2. Pulmonary rehab referral 05/2012   . Depression 11/19/2005  . Esophageal stenosis 08/08/2015   Mild, benign-appearing on EGD 06/29/2015  . Fibromyalgia 08/29/2010  . Gastroesophageal reflux disease   . History of blood transfusion    "several times"   . Hyperlipidemia LDL goal < 100 11/20/2005  . Internal hemorrhoids 08/04/2012  . Mitral stenosis    s/p Mitral valve replacement with a 27-mm pericardial porcine valve (Medtronic Mosaic valve, serial #[id number] on 09/20/10, Dr. Prescott Gum)   . Moderate to severe pulmonary hypertension    2014 TEE w PA peak pressure 46 mmHg, s/p MV replacement   . Obesity (BMI 30.0-34.9) 10/23/2011  . Obstructive sleep apnea    Nocturnal polysomnography (06/2009): Moderate sleep apnea/ hypopnea syndrome , AHI 17.8 per hour with nonpositional hypopneas. CPAP titration to 12 CWP, AHI 2.4 per hour. On nocturnal CPAP via a small resMed Quattro full-face mask with heated humidifier.   . Osteoporosis    DEXA (12/09/2011): L-spine T -3.7, left hip T -1.4 DEXA (12/2004): L-spine T -2.6, left hip -0.1   . Paroxysmal atrial fibrillation ([place]) 10/22/2010   s/p Left atrial maze procedure for  paroxysmal atrial fibrillation on 09/20/2010 by Dr Prescott Gum.  Subsequent splenic infarct, decision was made to re-anticoagulate with coumadin, likely life-long as this is the most likely cause of the splenic infarct.   . Right nephrolithiasis 09/06/2014   5 mm non-obstructing calculus seen on CT scan 09/05/2014   . Seborrheic  keratosis 09/28/2015  . Shortness of breath dyspnea   . Tobacco abuse 07/28/2012  . Type 2 diabetes mellitus with diabetic neuropathy (HCC)     Medications:  Prescriptions Prior to Admission  Medication Sig Dispense Refill Last Dose  . albuterol (PROAIR HFA) 108 (90 Base) MCG/ACT inhaler Inhale 1-2 puffs into the lungs every 6 (six) hours as needed for shortness of breath. 8.5 each 11 few days ago  . ALPRAZolam (XANAX) 1 MG tablet Take 0.5-1 tablets (0.5-1 mg total) by mouth 2 (two) times daily as needed for anxiety. (Patient taking differently: Take 1 mg by mouth at bedtime. ) 65 tablet 5 04/17/2016 at pm  . aspirin EC 325 MG tablet Take 1 tablet (325 mg total) by mouth daily. 30 tablet 0 04/18/2016 at Unknown time  . chlorpheniramine (CHLOR-TRIMETON) 4 MG tablet Take 1 tablet (4 mg total) by mouth 3 (three) times daily as needed for allergies. 90 tablet 11 few days ago  . cyclobenzaprine (FLEXERIL) 5 MG tablet Take 1 tablet (5 mg total) by mouth 3 (three) times daily as needed for muscle spasms. 90 tablet 3 few days ago  . ferrous gluconate (FERGON) 324 MG tablet Take 1 tablet (324 mg total) by mouth 2 (two) times daily with a meal. (Patient taking differently: Take 324 mg by mouth daily. ) 180 tablet 3 04/18/2016 at am  . FLUoxetine (PROZAC) 20 MG tablet Take 2 tablets (40 mg total) by mouth daily. 180 tablet 3 04/18/2016 at am  . fluticasone (FLONASE) 50 MCG/ACT nasal spray Place 2 sprays into both nostrils daily. (Patient taking differently: Place 2 sprays into both nostrils at bedtime. ) 16 g 3 04/17/2016 at Unknown time  . Fluticasone-Salmeterol (ADVAIR DISKUS) 500-50 MCG/DOSE AEPB Inhale 1 puff into the lungs 2 (two) times daily. 180 each 3 04/18/2016 at am  . furosemide (LASIX) 80 MG tablet Take 1 tablet (80 mg total) by mouth 2 (two) times daily. (Patient taking differently: Take 80 mg by mouth 2 (two) times daily as needed for fluid. ) 180 tablet 3 04/18/2016 at am  . gabapentin (NEURONTIN) 300 MG  capsule Take 2 capsules (600 mg total) by mouth 3 (three) times daily. 540 capsule 3 04/18/2016 at midday  . HYDROcodone-acetaminophen (NORCO/VICODIN) 5-325 MG tablet Take 1-2 tablets by mouth every 6 (six) hours as needed for severe pain. (Patient taking differently: Take 2 tablets by mouth at bedtime. ) 150 tablet 0 04/17/2016 at Unknown time  . insulin aspart (NOVOLOG) 100 UNIT/ML injection Inject 18-28 Units into the skin See admin instructions. Inject 18 units plus adjustment for CBG (sliding scale) - 3 times daily before meals   04/18/2016 at 1400  . Insulin Detemir (LEVEMIR FLEXTOUCH) 100 UNIT/ML Pen Inject 20 Units into the skin daily at 10 pm. (Patient taking differently: Inject 45 Units into the skin daily before breakfast. ) 15 mL 5 04/18/2016 at am  . lidocaine (XYLOCAINE) 2 % jelly APPLY TO AFFECTED AREA THREE TIMES A DAY (Patient taking differently: APPLY TO AFFECTED AREA THREE TIMES A DAY AS NEEDED FOR PAIN) 30 mL 10 few weeks ago  . nystatin cream (MYCOSTATIN) APPLY TO AFFECTED AREA TWICE DAILY (Patient taking  differently: APPLY TO AFFECTED AREA TWICE DAILY AS NEEDED FOR RASH) 30 g 2 few days ago  . omeprazole (PRILOSEC) 40 MG capsule Take 1 capsule (40 mg total) by mouth 2 (two) times daily. 180 capsule 3 04/18/2016 at am  . OXYGEN Inhale 2 L into the lungs as needed (for shortness of breath while active).   04/18/2016 at Unknown time  . potassium chloride SA (K-DUR,KLOR-CON) 20 MEQ tablet TAKE 1 TABLET BY MOUTH TWICE DAILY (Patient taking differently: TAKE 1 TABLET BY MOUTH TWICE DAILY AS NEEDED - WITH EACH DOSE OF LASIX) 180 tablet 2 04/18/2016 at am  . PRESCRIPTION MEDICATION Inhale into the lungs See admin instructions. CPAP - use whenever sleeping   04/18/2016 at am  . promethazine (PHENERGAN) 12.5 MG tablet Take 1 tablet (12.5 mg total) by mouth every 8 (eight) hours as needed for nausea or vomiting. 30 tablet 0 few weeks ago  . rosuvastatin (CRESTOR) 20 MG tablet TAKE 1 TABLET BY MOUTH EVERY NIGHT  AT BEDTIME 90 tablet 2 04/17/2016 at pm  . SPIRIVA HANDIHALER 18 MCG inhalation capsule INHALE THE CONTENTS OF 1 CAPSULE VIA HANDIHALER BY MOUTH EVERY DAY 90 capsule 2 04/18/2016 at Unknown time  . Blood Glucose Monitoring Suppl (ONETOUCH VERIO) W/DEVICE KIT 1 each by Does not apply route 4 (four) times daily. 1 kit 0 Taking  . EASY TOUCH PEN NEEDLES 31G X 5 MM MISC USE TO INJECT INSULIN 5 TIMES DAILY 200 each 8 Taking  . Insulin Disposable Pump (V-GO 40) KIT FILL AND PLACE VGO40 ABOUT THE SAME TIME EACH DAY. USE 4 TO 5 CLICKS WITH MEALS AND 2 CLICKS WITH SNACKS (Patient not taking: Reported on 04/18/2016) 30 kit 5 Not Taking at Unknown time  . Insulin Syringe-Needle U-100 31G X 15/64" 0.5 ML MISC Use to inject insulin up to 4 times a day 400 each 3   . Lancets Misc. (ACCU-CHEK FASTCLIX LANCET) KIT Check your blood 4 times a day dx code 250.00 insulin requiring 1 kit 2 Taking  . NOVOLOG 100 UNIT/ML injection USE TO FILL VGO40 TO WEAR DAILY. (Patient not taking: Reported on 04/18/2016) 30 mL 5 Not Taking at Unknown time  . ONETOUCH VERIO test strip Check blood sugar up to 4 times a day 350 each 11 Taking    Assessment: 66 y.o. F with troponin up to 2.26 and to begin heparin for ACS. No AC PTA. Hgb low but stable.   Goal of Therapy:  Heparin level 0.3-0.7 units/ml Monitor platelets by anticoagulation protocol: Yes   Plan:  D/c Lovenox - none given Heparin IV bolus 4000 units Heparin gtt at 850 units/hr Will f/u heparin level in 6 hours Daily heparin level and CBC  Sherlon Handing, PharmD, BCPS Clinical pharmacist, pager [phone] 04/19/2016,4:19 AM

## 2016-04-19 NOTE — Progress Notes (Signed)
RT placed pt on CPAP with 2L bleed. Mask secure. Pt states she wants to take a nap

## 2016-04-19 NOTE — H&P (Signed)
Date: 04/19/2016               Patient Name:  [name] MRN: [id number]  DOB: [birth year] Age / Sex: 66 y.o., female   PCP: [name], MD         Medical Service: Internal Medicine Teaching Service         Attending Physician: Dr. Evette Doffing    First Contact: Dr. Quita Skye Pager: [phone]  Second Contact: Dr. Marlowe Sax Pager: [phone]       After Hours (After 5p/  First Contact Pager: [phone]  weekends / holidays): Second Contact Pager: [phone]   Chief Complaint: Chest Pain  History of Present Illness: [name] is a 66 y.o. female, With past medical history significant for fibromyalgia, paroxysmal atrial fibrillation, CHF, DM 2, recently taken off of Coumadin 2 weeks ago secondary to GI bleed of unknown origin,Renal cell carcinoma of the left kidney,Came to the ED with complaint of generalized crampy pain mostly effecting her legs, arms and left abdomen, along with some chest pain for 1 day.  Patient was in her usual state of health until this morning, when she started experiencing generalized crampy pain mostly effecting her limbs and left side of abdomen. She denies any diarrhea, constipation, melena or hematochezia. She denies any upper respiratory symptoms, fever or chills, sick contact. She does endorse some headaches associated with mild photophobia and nausea. Denies any vomiting. She was also complaining of  mild chest pain stating as a substernal funny feeling, started in the afternoon while playing candy crash,non radiating, associated with mild left arm numbness and diaphoresis, pain improved with sublingual nitroglycerin. She denies any shortness of breath, palpitations, orthopnea or PND.  She do endorse bilateral leg swelling 1 day ago, for which she took Lasix, according to patient that helped with her swelling, but she keeps getting cramps in her left leg. She also endorse mild burning with urination, denies any hematuria.  She was being admitted for chest pain  rule out.  Meds:  Current Meds  Medication Sig  . albuterol (PROAIR HFA) 108 (90 Base) MCG/ACT inhaler Inhale 1-2 puffs into the lungs every 6 (six) hours as needed for shortness of breath.  . ALPRAZolam (XANAX) 1 MG tablet Take 0.5-1 tablets (0.5-1 mg total) by mouth 2 (two) times daily as needed for anxiety. (Patient taking differently: Take 1 mg by mouth at bedtime. )  . aspirin EC 325 MG tablet Take 1 tablet (325 mg total) by mouth daily.  . chlorpheniramine (CHLOR-TRIMETON) 4 MG tablet Take 1 tablet (4 mg total) by mouth 3 (three) times daily as needed for allergies.  . cyclobenzaprine (FLEXERIL) 5 MG tablet Take 1 tablet (5 mg total) by mouth 3 (three) times daily as needed for muscle spasms.  . ferrous gluconate (FERGON) 324 MG tablet Take 1 tablet (324 mg total) by mouth 2 (two) times daily with a meal. (Patient taking differently: Take 324 mg by mouth daily. )  . FLUoxetine (PROZAC) 20 MG tablet Take 2 tablets (40 mg total) by mouth daily.  . fluticasone (FLONASE) 50 MCG/ACT nasal spray Place 2 sprays into both nostrils daily. (Patient taking differently: Place 2 sprays into both nostrils at bedtime. )  . Fluticasone-Salmeterol (ADVAIR DISKUS) 500-50 MCG/DOSE AEPB Inhale 1 puff into the lungs 2 (two) times daily.  . furosemide (LASIX) 80 MG tablet Take 1 tablet (80 mg total) by mouth 2 (two) times daily. (Patient taking differently: Take 80 mg by mouth 2 (two)  times daily as needed for fluid. )  . gabapentin (NEURONTIN) 300 MG capsule Take 2 capsules (600 mg total) by mouth 3 (three) times daily.  Marland Kitchen HYDROcodone-acetaminophen (NORCO/VICODIN) 5-325 MG tablet Take 1-2 tablets by mouth every 6 (six) hours as needed for severe pain. (Patient taking differently: Take 2 tablets by mouth at bedtime. )  . insulin aspart (NOVOLOG) 100 UNIT/ML injection Inject 18-28 Units into the skin See admin instructions. Inject 18 units plus adjustment for CBG (sliding scale) - 3 times daily before meals  .  Insulin Detemir (LEVEMIR FLEXTOUCH) 100 UNIT/ML Pen Inject 20 Units into the skin daily at 10 pm. (Patient taking differently: Inject 45 Units into the skin daily before breakfast. )  . lidocaine (XYLOCAINE) 2 % jelly APPLY TO AFFECTED AREA THREE TIMES A DAY (Patient taking differently: APPLY TO AFFECTED AREA THREE TIMES A DAY AS NEEDED FOR PAIN)  . nystatin cream (MYCOSTATIN) APPLY TO AFFECTED AREA TWICE DAILY (Patient taking differently: APPLY TO AFFECTED AREA TWICE DAILY AS NEEDED FOR RASH)  . omeprazole (PRILOSEC) 40 MG capsule Take 1 capsule (40 mg total) by mouth 2 (two) times daily.  . OXYGEN Inhale 2 L into the lungs as needed (for shortness of breath while active).  . potassium chloride SA (K-DUR,KLOR-CON) 20 MEQ tablet TAKE 1 TABLET BY MOUTH TWICE DAILY (Patient taking differently: TAKE 1 TABLET BY MOUTH TWICE DAILY AS NEEDED - WITH EACH DOSE OF LASIX)  . PRESCRIPTION MEDICATION Inhale into the lungs See admin instructions. CPAP - use whenever sleeping  . promethazine (PHENERGAN) 12.5 MG tablet Take 1 tablet (12.5 mg total) by mouth every 8 (eight) hours as needed for nausea or vomiting.  . rosuvastatin (CRESTOR) 20 MG tablet TAKE 1 TABLET BY MOUTH EVERY NIGHT AT BEDTIME  . SPIRIVA HANDIHALER 18 MCG inhalation capsule INHALE THE CONTENTS OF 1 CAPSULE VIA HANDIHALER BY MOUTH EVERY DAY     Allergies: Allergies as of 04/18/2016 - Review Complete 04/18/2016  Allergen Reaction Noted  . Morphine and related Other (See Comments) 06/27/2013  . Oxycontin [oxycodone] Other (See Comments) 05/03/2012  . Tramadol hcl Swelling   . Lorazepam Other (See Comments) 11/04/2010   Past Medical History:  Diagnosis Date  . Adenomatous polyps 05/14/2011   Colonoscopy (05/2011): 4 mm adenomatous polyp excised endoscopically Colonoscopy (02/2002): Adenomatous polyp excised endoscopically   . Allergic rhinitis 06/01/2012  . Anemia of chronic disease 01/01/2013  . Anxiety 07/24/2010  . Aortic atherosclerosis  ([place]) 10/19/2014   Seen on CT scan, currently asymptomatic  . 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 Asymptomatic cholelithiasis 09/25/2015   Seen on CT scan 08/2015  . Bilateral cataracts 08/04/2012   Visually insignificant   . Carotid artery stenosis    s/p right endarterectomy (06/2010) Carotid US (07/2010):  Left: Moderate-to-severe (60-79%) calcific and non-calcific plaque origin and proximal ICA and ECA   . Chronic congestive heart failure with left ventricular diastolic dysfunction ([place]) 10/21/2010  . Chronic constipation 02/03/2011  . Chronic daily headache 01/16/2014  . Chronic low back pain 10/06/2012  . Chronic venous insufficiency 08/04/2012  . Clear cell renal cell carcinoma ([place]) 07/21/2011   s/p cryoablation of left RCC in 09/2011 by [name]. Followed by [name]  [facility]) .    Marland Kitchen COPD (chronic obstructive pulmonary disease) with emphysema (HCC)    PFTs 02/2012: FEV1 0.92 (40%), ratio 69, 27% increase in FEV1 with BD, TLC 91%, severe airtrapping,  DLCO49% On chronic home O2. Pulmonary rehab referral 05/2012   . Depression 11/19/2005  . Esophageal stenosis 08/08/2015   Mild, benign-appearing on EGD 06/29/2015  . Fibromyalgia 08/29/2010  . Gastroesophageal reflux disease   . History of blood transfusion    "several times"   . Hyperlipidemia LDL goal < 100 11/20/2005  . Internal hemorrhoids 08/04/2012  . Mitral stenosis    s/p Mitral valve replacement with a 27-mm pericardial porcine valve (Medtronic Mosaic valve, serial #[id number] on 09/20/10, Dr. Prescott Gum)   . Moderate to severe pulmonary hypertension    2014 TEE w PA peak pressure 46 mmHg, s/p MV replacement   . Obesity (BMI 30.0-34.9) 10/23/2011  . Obstructive sleep apnea    Nocturnal polysomnography (06/2009): Moderate sleep apnea/ hypopnea syndrome , AHI 17.8 per hour with nonpositional hypopneas. CPAP titration to 12 CWP,  AHI 2.4 per hour. On nocturnal CPAP via a small resMed Quattro full-face mask with heated humidifier.   . Osteoporosis    DEXA (12/09/2011): L-spine T -3.7, left hip T -1.4 DEXA (12/2004): L-spine T -2.6, left hip -0.1   . Paroxysmal atrial fibrillation ([place]) 10/22/2010   s/p Left atrial maze procedure for paroxysmal atrial fibrillation on 09/20/2010 by Dr Prescott Gum.  Subsequent splenic infarct, decision was made to re-anticoagulate with coumadin, likely life-long as this is the most likely cause of the splenic infarct.   . Right nephrolithiasis 09/06/2014   5 mm non-obstructing calculus seen on CT scan 09/05/2014   . Seborrheic keratosis 09/28/2015  . Shortness of breath dyspnea   . Tobacco abuse 07/28/2012  . Type 2 diabetes mellitus with diabetic neuropathy (HCC)     Family History: Heart attack in brother at age of 74 and father at age of 24. Daughter has lupus.  Social History: She smokes for the past 53 years. Denies any alcohol or illicit drug use.  Review of Systems: A complete ROS was negative except as per HPI.   Physical Exam: Blood pressure (!) 186/43, pulse 84, temperature 98.4 F (36.9 C), temperature source Oral, resp. rate (!) 24, height _0  (1.575 m), weight 189 lb 1.6 oz (85.8 kg), SpO2 93 %. Vitals:   04/19/16 0115 04/19/16 0230 04/19/16 0302 04/19/16 0433  BP: 148/57  (!) 157/51 (!) 186/43  Pulse: 86 81  84  Resp: 26 19  (!) 24  Temp:   98 F (36.7 C) 98.4 F (36.9 C)  TempSrc:    Oral  SpO2: 95% 97%  93%  Weight:    189 lb 1.6 oz (85.8 kg)  Height:    _1  (1.575 m)   General: Vital signs reviewed.  Patient is well-developed and well-nourished, in no acute distress and cooperative with exam.  Head: Normocephalic and atraumatic. Eyes: EOMI, conjunctivae normal, no scleral icterus.  Neck: Supple, trachea midline, normal ROM, no JVD, masses, thyromegaly, or carotid bruit present.  Cardiovascular: RRR, S1 normal, S2 normal, no murmurs, gallops, or  rubs. Pulmonary/Chest: Clear to auscultation bilaterally, no wheezes, rales, or rhonchi. Abdominal: Soft, left lower quadrant tenderness, non-distended, BS +, no masses, organomegaly, or guarding present.  Musculoskeletal: No joint deformities, erythema, or stiffness, ROM full and nontender. Extremities: Bilateral 1+ pitting edema with some erythema, more pronounced on left leg, left calf tenderness. Pulses 2+ bilaterally. Neurological: A&O x3, Strength is normal and symmetric bilaterally, cranial nerve II-XII are grossly intact, no focal motor deficit, sensory intact to light touch bilaterally.  Skin: Warm, dry and intact. No rashes or erythema. Psychiatric:  Normal mood and affect. speech and behavior is normal. Cognition and memory are normal.  Labs. CBC Latest Ref Rng & Units 04/19/2016 04/18/2016 03/29/2016  WBC 4.0 - 10.5 K/uL 9.8 9.4 8.0  Hemoglobin 12.0 - 15.0 g/dL 9.7(L) 9.9(L) 8.9(L)  Hematocrit 36.0 - 46.0 % 32.9(L) 32.9(L) 29.4(L)  Platelets 150 - 400 K/uL 240 215 191   BMP Latest Ref Rng & Units 04/18/2016 03/28/2016 03/27/2016  Glucose 65 - 99 mg/dL 81 200(H) 263(H)  BUN 6 - 20 mg/dL _0 Creatinine 0.44 - 1.00 mg/dL 1.09(H) 0.94 0.93  BUN/Creat Ratio 12 - 28 - - -  Sodium 135 - 145 mmol/L 140 139 132(L)  Potassium 3.5 - 5.1 mmol/L 3.6 4.4 4.2  Chloride 101 - 111 mmol/L 101 105 96(L)  CO2 22 - 32 mmol/L _1 Calcium 8.9 - 10.3 mg/dL 9.6 8.5(L) 8.5(L)   Trop. 0.07>> 0.77>>2.26  EKG: Sinus rhythm, with few PVCs, and ST segment elevation in leads 2 and 3 which were new as compared to the previous one.  CXR: FINDINGS: Stable cardiomegaly. The aorta is atherosclerotic without aneurysm. Mild interstitial edema without pneumonic consolidation, effusion or pneumothorax. The patient is status post median sternotomy. Old right-sided rib fractures are noted with healing. Plate and screw fixation of the mid clavicle. Vascular clips are seen at along the right side of the  neck.  IMPRESSION: Mild interstitial edema.  Aortic atherosclerosis.  Multiple chronic right-sided rib fractures. Plate and screw fixation of the mid right clavicle.  CT head without contrast. FINDINGS: Brain: There is no intracranial hemorrhage, mass or evidence of acute infarction. There is no extra-axial fluid collection. Gray matter and white matter appear normal. Cerebral volume is normal for age. Brainstem and posterior fossa are unremarkable. The CSF spaces appear normal.  Vascular: No hyperdense vessel or unexpected calcification.  Skull: Normal. Negative for fracture or focal lesion.  Sinuses/Orbits: No acute finding.  Other: None.  IMPRESSION: Normal brain  Assessment & Plan by Problem:   [name] is a 66 y.o. female, With past medical history significant for fibromyalgia, paroxysmal atrial fibrillation, CHF, DM 2, recently taken off of Coumadin 2 weeks ago secondary to GI bleed of unknown origin, renal cell carcinoma of the left kidney,Came to the ED with complaint of generalized crampy pain mostly effecting her legs, arms and left abdomen, along with some chest pain for 1 day.   Chest pain/NSTEMI. She described her chest pain has funny feeling in her central to left chest, associated with some numbness of left arm , mild nausea and diaphoresis.Pain relieved with sublingual nitroglycerin given by EMS.She was denying any ongoing chest pain when seen in ED.her EKG was showing mild ST segment elevation in lead 2 and 3 , which was new as compared to previous one.she was also found to have mildly elevated troponin at 0.07 on initial and 0.77 on repeat after 3 hour. I consulted with cardiology fellow Dr. Elson Areas who said it looks more like J-point elevation. As she was not having any pain currently and had a recent history of GI bleed. We will keep monitoring. -Admit to telemetry. -Repeat EKG in the morning -Trend troponin. Her third set of troponin came back  2.26, cardiology fellow Dr. Elson Areas, was consulted again, advised to put her on heparin infusion. She will be seen by cardiologist in the morning. -Start heparin infusion. -Monitor for any sign of bleeding. -Keep trending troponin.  Lower extremity edema, more pronounced in left leg.  She has bilateral lower extremity edema more pronounced in left leg along with left calf tenderness. According to patient she she did had a clot in her leg once, no documentation. She hasn't history of left renal cell carcinoma, post ablation in 2013. Found to have a small 1.2 cm focus of enhancing soft tissue again noted along the posterior aspect of the region, thought to reflect enhancing scar tissue. No evidence of local recurrence. During CT abdomen done in February 2018 when she was admitted for GI bleed.She was seen by interventional radiologist on 04/09/2016 and advised to repeat CT in August 2018. Her well's criteria for DVT if we consider her renal cell carcinoma as active was 3, which makes it high risk. Without active carcinoma it was 2. Her d-dimer is mildly elevated at 0.51. -Doppler of left lower extremity to rule out DVT.  HFpEF: Echo done in 09/02/2012 shows normal ejection fraction with mild LVH and severely dilated left atrium. She is having 1+ lower extremity edema and erythema, more on left. She uses Lasix when necessary, started taking for last 2 days after noticing lower extremity edema. According to patient her swelling improved. Chest x-ray showing mild interstitial edema. -Hold Lasix- because of possible an STEMI, she does not look volume overloaded clinically but chest x-ray does show mild interstitial edema.  Iron deficiency anemia due to GI bleed. She had a GI bleed most likely from her small intestine  AVM, requiring 2 units of blood and IV iron loading dose during her previous hospitalization in February 2018. No current active bleeding. Her current hemoglobin is 9.9, improved from 8.9 at  previous discharge from hospital. -Continue her home dose of iron supplements.  PAF and s/p mitral valve repair.She had porcine valve.Her CHADVASC was4%. After discussing with patient about the risk and benefit to remain on anticoagulation, during her previous hospitalization for GI bleed,she decided to discontinue Coumadin.She understandthe risk including stroke well. She was discharged home on aspirin 325 mg daily. -Continue home dose of aspirin.  Type 2 diabetes. Her last A1c done in November 2017 was 7.6. Her current CBG was elevated at 202. -Levemir 20 units at bedtime. -NovoLog 8 units 3 times daily with meals. -SSI  COPD / OSA:no current exacerbation. Continue home Advair and Spiriva. CPAP qHS.  Depression / Anxiety: -Continue home meds: fluoxetine and alprazolam  Chronic Pain / Fibromyalgia. -Continue home meds Norvo 5-325 1-2 tabs q6h PRN, Flexeril 22m TID PRN.  CODE STATUS. Full DVT prophylaxis. Heparin Diet. Heart healthy.  Dispo: Admit patient to Observation with expected length of stay less than 2 midnights.  Signed: SLorella Nimrod MD 04/19/2016, 5:19 AM  Pager: [phone]

## 2016-04-19 NOTE — ED Notes (Signed)
Evette Doffing MD informed of CRITICAL HIGH TROP 2.26

## 2016-04-19 NOTE — Progress Notes (Signed)
Page to [facility] to ensure she saw the most recent Troponin of 4.98.  Per B.Simmons,PA-- plan for possible cath on Monday; notify if pt develops chest pain.   Pt is currently asymptomatic

## 2016-04-19 NOTE — Progress Notes (Signed)
Internal Medicine Attending:   I saw and examined the patient. I reviewed the resident's note and I agree with the resident's findings and plan as documented in the resident's note.  Hospital day #1 with NSTEMI, troponin rising and at 2.9. Patient with numerous risk factors for ischemic disease including diabetes and tobacco use. ECG is stable, currently chest pain free, medical management with aspirin, heparin infusion, and statin. Cardiology consulted and she is NPO.

## 2016-04-19 NOTE — Progress Notes (Signed)
ANTICOAGULATION CONSULT NOTE - Follow-Up  Pharmacy Consult for Heparin Indication: chest pain/ACS  Allergies  Allergen Reactions  . Morphine And Related Other (See Comments)    Injection site reaction  . Oxycontin [Oxycodone] Other (See Comments)    headache  . Tramadol Hcl Swelling    Ankle swelling  . Lorazepam Other (See Comments)    Patient's sister noted that ativan caused the patient to become extremely confused during hospitalization 09/2010; tolerates Xanax    Patient Measurements: Height: 5' 2" (157.5 cm) Weight: 189 lb 1.6 oz (85.8 kg) IBW/kg (Calculated) : 50.1 Heparin Dosing Weight: 70 kg  Vital Signs: Temp: 98.2 F (36.8 C) (03/10 0900) Temp Source: Oral (03/10 0900) BP: 151/46 (03/10 0900) Pulse Rate: 74 (03/10 0900)  Labs:  Recent Labs  04/18/16 2208 04/19/16 0428 04/19/16 1039  HGB 9.9* 9.7*  --   HCT 32.9* 32.9*  --   PLT 215 240  --   HEPARINUNFRC  --   --  <0.10*  CREATININE 1.09* 1.01*  --   TROPONINI  --  2.90* 4.98*    Estimated Creatinine Clearance: 55.7 mL/min (by C-G formula based on SCr of 1.01 mg/dL (H)).   Assessment: 66 y.o. F with troponin up to 2.26 on heparin for ACS/NSTEMI. No AC PTA. Heparin level <0.10 on 850 units/hr. Spoke with nurse and checked line. No clamping, infusion going in to arm. Level was timed appropriately from start of infusion. No bleeding noted. CBC is low but stable.   Goal of Therapy:  Heparin level 0.3-0.7 units/ml Monitor platelets by anticoagulation protocol: Yes   Plan:  Heparin 1000 unit bolus  Increase heparin to 1000 units/hr  Will f/u heparin level in 6 hours Daily heparin level and CBC  Uvaldo Bristle,  PharmD Pager [phone] 04/19/2016,2:00 PM

## 2016-04-19 NOTE — Progress Notes (Signed)
Subjective:  Denies any chest pain, shortness of breath, or palpitations currently. She was surprised she had a heart attack as her pain was so "strange."   Objective:  Vital signs in last 24 hours: Vitals:   04/19/16 0529 04/19/16 0625 04/19/16 0900 04/19/16 0927  BP:  (!) 154/68 (!) 151/46   Pulse:   74   Resp:   18   Temp:   98.2 F (36.8 C)   TempSrc:   Oral   SpO2: 98%  97% 98%  Weight:      Height:       General: Elderly woman sitting up in bed, NAD HEENT: [place]/AT, EOMI, sclera anicteric, mucus membranes moist CV: RRR, no m/g/r Pulm: CTA bilaterally, breaths non-labored  Ext: no peripheral edema. No swelling, erythema, or tenderness to suggest DVT.  Neuro: alert and oriented x 3  Assessment/Plan:  NSTEMI: Troponins continuing to trend up to 2.9 this morning. She is currently without chest pain. Discussed case with Cardiology and they will see her today, likely cath on Monday. Continue with medical therapy for now. - Continue heparin gtt  - Cardiac monitoring - Trend troponin until peak - Continue ASA, Crestor - Heart healthy diet   Lower extremity edema: Noted by night team. She does not have any lower extremity on exam this morning. No other evidence to suggest DVT. Doppler US canceled.   HFpEF: Echo in 2014 with normal EF, mild LVH, and severely dilated left atrium. Not volume overloaded currently.  - Hold home Lasix with NSTEMI  Iron deficiency anemia: Recent hospitalization for GI bleed secondary to AVM. Hgb stable at 9.7 - Continue to monitor - Continue home iron   PAF and s/p mitral valve repair: Coumadin discontinued after GI bleed as above. Has pocine valve.  - Continue home ASA   Type 2 DM: CBGs in 200s. Likely did not get home Levemir last night as she was in ED.  - Continue Levemir 20 units QHS - Continue moderate ISS - Hold mealtime insulin for now as NPO  COPD/OSA: Stable.  - Continue home Advair and Spiriva - CPAP  QHS  Depression/Anxiety: - Continue home Fluoxetine and Alprazolam   Chronic Pain/Fibromyalgia:  - Continue home Norco, Flexeril  Tobacco Abuse: Smokes 1 pack per day. - Start nicotine patch   Diet: Heart healthy  DVT PPx: Heparin gtt Dispo: Anticipated discharge in approximately 1-2 day(s).   Juliet Rude, MD 04/19/2016, 10:20 AM Pager: [phone]

## 2016-04-20 DIAGNOSIS — Z8719 Personal history of other diseases of the digestive system: Secondary | ICD-10-CM

## 2016-04-20 DIAGNOSIS — G4733 Obstructive sleep apnea (adult) (pediatric): Secondary | ICD-10-CM

## 2016-04-20 DIAGNOSIS — H53149 Visual discomfort, unspecified: Secondary | ICD-10-CM

## 2016-04-20 DIAGNOSIS — R51 Headache: Secondary | ICD-10-CM

## 2016-04-20 DIAGNOSIS — D62 Acute posthemorrhagic anemia: Secondary | ICD-10-CM

## 2016-04-20 DIAGNOSIS — K219 Gastro-esophageal reflux disease without esophagitis: Secondary | ICD-10-CM

## 2016-04-20 DIAGNOSIS — Z952 Presence of prosthetic heart valve: Secondary | ICD-10-CM

## 2016-04-20 LAB — GLUCOSE, CAPILLARY
Glucose-Capillary: 203 mg/dL — ABNORMAL HIGH (ref 65–99)
Glucose-Capillary: 247 mg/dL — ABNORMAL HIGH (ref 65–99)
Glucose-Capillary: 267 mg/dL — ABNORMAL HIGH (ref 65–99)
Glucose-Capillary: 295 mg/dL — ABNORMAL HIGH (ref 65–99)

## 2016-04-20 LAB — CBC
HCT: 31.4 % — ABNORMAL LOW (ref 36.0–46.0)
Hemoglobin: 9.3 g/dL — ABNORMAL LOW (ref 12.0–15.0)
MCH: 27.7 pg (ref 26.0–34.0)
MCHC: 29.6 g/dL — ABNORMAL LOW (ref 30.0–36.0)
MCV: 93.5 fL (ref 78.0–100.0)
Platelets: 242 10*3/uL (ref 150–400)
RBC: 3.36 MIL/uL — ABNORMAL LOW (ref 3.87–5.11)
RDW: 21 % — ABNORMAL HIGH (ref 11.5–15.5)
WBC: 8.1 10*3/uL (ref 4.0–10.5)

## 2016-04-20 LAB — HEPARIN LEVEL (UNFRACTIONATED)
Heparin Unfractionated: 0.24 IU/mL — ABNORMAL LOW (ref 0.30–0.70)
Heparin Unfractionated: 0.2 IU/mL — ABNORMAL LOW (ref 0.30–0.70)
Heparin Unfractionated: 0.31 IU/mL (ref 0.30–0.70)

## 2016-04-20 LAB — TROPONIN I: Troponin I: 2.35 ng/mL (ref <0.03)

## 2016-04-20 MED ORDER — SODIUM CHLORIDE 0.9 % IV SOLN: 250.0000 mL | INTRAVENOUS | Status: DC | PRN

## 2016-04-20 MED ORDER — INSULIN DETEMIR 100 UNIT/ML ~~LOC~~ SOLN
30.0000 [IU] | Freq: Every day | SUBCUTANEOUS | Status: DC
  Administered 2016-04-20 – 2016-04-24 (×5): 30 [IU] via SUBCUTANEOUS
  Filled 2016-04-20 (×5): qty 0.3

## 2016-04-20 MED ORDER — SODIUM CHLORIDE 0.9 % WEIGHT BASED INFUSION
1.0000 mL/kg/h | INTRAVENOUS | Status: DC
  Administered 2016-04-21: 1 mL/kg/h via INTRAVENOUS

## 2016-04-20 MED ORDER — ASPIRIN 81 MG PO CHEW
81.0000 mg | CHEWABLE_TABLET | ORAL | Status: AC
  Administered 2016-04-21: 81 mg via ORAL
  Filled 2016-04-20: qty 1

## 2016-04-20 MED ORDER — SODIUM CHLORIDE 0.9% FLUSH: 3.0000 mL | INTRAVENOUS | Status: DC | PRN

## 2016-04-20 MED ORDER — FAMOTIDINE 20 MG PO TABS
20.0000 mg | ORAL_TABLET | Freq: Two times a day (BID) | ORAL | Status: DC | PRN
  Administered 2016-04-21: 20 mg via ORAL
  Filled 2016-04-20: qty 1

## 2016-04-20 MED ORDER — SODIUM CHLORIDE 0.9% FLUSH
3.0000 mL | Freq: Two times a day (BID) | INTRAVENOUS | Status: DC
  Administered 2016-04-20: 3 mL via INTRAVENOUS

## 2016-04-20 MED ORDER — SODIUM CHLORIDE 0.9 % WEIGHT BASED INFUSION
3.0000 mL/kg/h | INTRAVENOUS | Status: DC
  Administered 2016-04-21: 3 mL/kg/h via INTRAVENOUS

## 2016-04-20 MED ORDER — GI COCKTAIL ~~LOC~~
30.0000 mL | Freq: Once | ORAL | Status: AC | PRN
  Administered 2016-04-20: 30 mL via ORAL
  Filled 2016-04-20: qty 30

## 2016-04-20 NOTE — Progress Notes (Signed)
ANTICOAGULATION CONSULT NOTE - Follow-Up  Pharmacy Consult for Heparin Indication: chest pain/ACS  Allergies  Allergen Reactions  . Morphine And Related Other (See Comments)    Injection site reaction  . Oxycontin [Oxycodone] Other (See Comments)    headache  . Tramadol Hcl Swelling    Ankle swelling  . Lorazepam Other (See Comments)    Patient's sister noted that ativan caused the patient to become extremely confused during hospitalization 09/2010; tolerates Xanax    Patient Measurements: Height: _0  (157.5 cm) Weight: 190 lb 9.6 oz (86.5 kg) IBW/kg (Calculated) : 50.1 Heparin Dosing Weight: 69.6 kg  Vital Signs: Temp: 97.8 F (36.6 C) (03/11 2058) Temp Source: Oral (03/11 2058) BP: 149/69 (03/11 2058) Pulse Rate: 73 (03/11 2058)  Labs:  Recent Labs  04/18/16 2208 04/19/16 0428 04/19/16 1039  04/20/16 0510 04/20/16 0912 04/20/16 1411 04/20/16 2120  HGB 9.9* 9.7*  --   --  9.3*  --   --   --   HCT 32.9* 32.9*  --   --  31.4*  --   --   --   PLT 215 240  --   --  242  --   --   --   HEPARINUNFRC  --   --  <0.10*  < > 0.24*  --  0.20* 0.31  CREATININE 1.09* 1.01*  --   --   --   --   --   --   TROPONINI  --  2.90* 4.98*  --   --  2.35*  --   --   < > = values in this interval not displayed.  Estimated Creatinine Clearance: 56 mL/min (by C-G formula based on SCr of 1.01 mg/dL (H)).   Assessment: 66 y.o. F with on heparin for NSTEMI. Heparin level now at low end of therapeutic (0.31) on gtt at 1500 units/hr. No bleeding noted.  Goal of Therapy:  Heparin level 0.3-0.7 units/ml Monitor platelets by anticoagulation protocol: Yes   Plan:  Increase heparin to 1600 units/hr to make sure stays therapeutic Daily heparin level and CBC  Sherlon Handing, PharmD, BCPS Clinical pharmacist, pager [phone] 04/20/2016,10:21 PM

## 2016-04-20 NOTE — Progress Notes (Signed)
RT NOTE:  CPAP ready @ bedside. Pt stated she will put on when ready.

## 2016-04-20 NOTE — Progress Notes (Addendum)
Heparin Rate changed  To 56m/hr PTT 0.31 pharmacy increased by 186mdue to being at the bottom end of the the therapeutic range.

## 2016-04-20 NOTE — Progress Notes (Signed)
ANTICOAGULATION CONSULT NOTE - Follow-Up  Pharmacy Consult for Heparin Indication: chest pain/ACS  Allergies  Allergen Reactions  . Morphine And Related Other (See Comments)    Injection site reaction  . Oxycontin [Oxycodone] Other (See Comments)    headache  . Tramadol Hcl Swelling    Ankle swelling  . Lorazepam Other (See Comments)    Patient's sister noted that ativan caused the patient to become extremely confused during hospitalization 09/2010; tolerates Xanax    Patient Measurements: Height: 5' 2" (157.5 cm) Weight: 190 lb 9.6 oz (86.5 kg) IBW/kg (Calculated) : 50.1 Heparin Dosing Weight: 69.6 kg  Vital Signs: Temp: 98.7 F (37.1 C) (03/11 1200) Temp Source: Oral (03/11 1200) BP: 111/51 (03/11 1200) Pulse Rate: 57 (03/11 1200)  Labs:  Recent Labs  04/18/16 2208 04/19/16 0428  04/19/16 1039 04/19/16 2041 04/20/16 0510 04/20/16 0912 04/20/16 1411  HGB 9.9* 9.7*  --   --   --  9.3*  --   --   HCT 32.9* 32.9*  --   --   --  31.4*  --   --   PLT 215 240  --   --   --  242  --   --   HEPARINUNFRC  --   --   < > <0.10* 0.13* 0.24*  --  0.20*  CREATININE 1.09* 1.01*  --   --   --   --   --   --   TROPONINI  --  2.90*  --  4.98*  --   --  2.35*  --   < > = values in this interval not displayed.  Estimated Creatinine Clearance: 56 mL/min (by C-G formula based on SCr of 1.01 mg/dL (H)).   Assessment: 66 y.o. F with troponin up to 2.26 on heparin for ACS/NSTEMI. No AC PTA. 8h Heparin level = 0.20 on 1350 units/hr.,  Subtherapeutic level.. No bleeding noted. CBC is low but stable. Spoke with nurse and there are still no problems with the line. Patient thought her line was leaking earlier today but when nurse checked barely any fluid had escaped.   Goal of Therapy:  Heparin level 0.3-0.7 units/ml Monitor platelets by anticoagulation protocol: Yes   Plan:  Increase heparin to 1500 units/hr  Will f/u heparin level in 6 hours Daily heparin level and CBC  Uvaldo Bristle, PharmD PGY1 Pharmacy Resident Pager: [phone] 04/20/2016,3:29 PM

## 2016-04-20 NOTE — Progress Notes (Signed)
Patient woke during report, discussed incorrect time on the clock, and began debating on going back to sleep.

## 2016-04-20 NOTE — Progress Notes (Signed)
Page sent to after hours first contact, to notify of pts hx of not responding to po protonix and preferring IV protonix. Pt requesting.   Per Dr Wynetta Emery and MD at bedside just after the callback, reports change of orders for reflux can be expected.

## 2016-04-20 NOTE — Progress Notes (Signed)
   Subjective:  Reports mild headache this morning with photophobia, sitting in dark eating breakfast. States that it feels better than yesterday after a night of sleep. Denies any chest pain, dyspnea, palpitations.   Tele reviewed and shows persistent atrial fibrillation at normal HR, no other events.  Objective:  Vital signs in last 24 hours: Vitals:   04/19/16 1958 04/19/16 2323 04/19/16 2352 04/20/16 0402  BP: (!) 135/52 130/68  140/68  Pulse: 63 68 70 (!) 56  Resp: _0 Temp: 97.6 F (36.4 C) 97.7 F (36.5 C)  97.8 F (36.6 C)  TempSrc: Oral Oral  Oral  SpO2: 95% 96% 96% 94%  Weight:    190 lb 9.6 oz (86.5 kg)  Height:       General: Elderly woman sitting up in bed, NAD, conversational HEENT: [place]/AT, moist mucus membranes CV: Irregular rate, normal rhythm, no m/g/r Pulm: CTA bil, normal work of breathing Ext: No peripheral edema. Warm and well-perfused Neuro: alert and oriented x 3  Assessment/Plan:  NSTEMI: Troponins continued to trend up, 4.98 yesterday morning. She is currently without chest pain. Seen by cardiology yesterday, likely cath on Monday. Continue with medical therapy for now. - Check Troponin x1 this AM - Continue heparin gtt  - Appreciate Cardiology recs - Follow TTE to eval for LV dysfunction or wall motion abnormalities - Continue ASA, Crestor - Heart healthy diet   HFpEF: Echo in 2014 with normal EF, mild LVH, and severely dilated left atrium. Not volume overloaded currently.  - Follow TTE - Hold home Lasix with NSTEMI  Iron deficiency anemia: Recent hospitalization for GI bleed secondary to AVM. Hgb stable at at 9-10 - Follow CBC - Continue home iron   PAF and s/p mitral valve repair: Coumadin discontinued after GI bleed as above. Has pocine valve. CHADSVASC score 6 - Continue home ASA   Type 2 DM: CBGs in 200s. Last A1c 7.6 in 12/2015 - Continue Levemir 20 units QHS - Continue moderate ISS  COPD/OSA: Stable.  - Continue home  Advair and Spiriva - CPAP QHS  Depression/Anxiety: - Continue home Fluoxetine and Alprazolam   Chronic Pain/Fibromyalgia:  - Continue home Norco, Flexeril  Tobacco Abuse: Smokes 1 pack per day. - Start nicotine patch  Diet: Heart healthy  DVT PPx: Heparin gtt  Dispo: Anticipated discharge in approximately 1-2 day(s).   Asencion Partridge, MD 04/20/2016, 7:39 AM Pager: [phone]

## 2016-04-20 NOTE — Progress Notes (Signed)
ANTICOAGULATION CONSULT NOTE - Follow Up Consult  Pharmacy Consult for heparin Indication: NSTEMI  Labs:  Recent Labs  04/18/16 2208 04/19/16 0428 04/19/16 1039 04/19/16 2041 04/20/16 0510  HGB 9.9* 9.7*  --   --  9.3*  HCT 32.9* 32.9*  --   --  31.4*  PLT 215 240  --   --  242  HEPARINUNFRC  --   --  <0.10* 0.13* 0.24*  CREATININE 1.09* 1.01*  --   --   --   TROPONINI  --  2.90* 4.98*  --   --     Assessment: 66yo female remains subtherapeutic on heparin after rate increases, now closer to goal.  Goal of Therapy:  Heparin level 0.3-0.7 units/ml   Plan:  Will increase heparin gtt by 2 units/kg/hr to 1350 units/hr and check level in [place], PharmD, BCPS  04/20/2016,6:19 AM

## 2016-04-20 NOTE — Progress Notes (Signed)
Progress Note  Patient Name: [name] Date of Encounter: 04/20/2016  Primary Cardiologist: B. Crenshaw MD  Subjective   Denies chest pain and shortness of breath. Primary complaint related to GERD. Says oral Protonix never works for her. Has associated nausea, no vomiting.  Inpatient Medications    Scheduled Meds: . aspirin EC  325 mg Oral Daily  . ferrous gluconate  324 mg Oral BID WC  . FLUoxetine  40 mg Oral Daily  . fluticasone  2 spray Each Nare QHS  . gabapentin  600 mg Oral TID  . insulin aspart  0-15 Units Subcutaneous TID WC  . insulin detemir  20 Units Subcutaneous QHS  . metoprolol tartrate  25 mg Oral BID  . mometasone-formoterol  2 puff Inhalation BID  . nicotine  21 mg Transdermal Daily  . rosuvastatin  20 mg Oral QHS  . tiotropium  18 mcg Inhalation Daily   Continuous Infusions: . heparin 1,350 Units/hr (04/20/16 0618)   PRN Meds: acetaminophen, albuterol, ALPRAZolam, cyclobenzaprine, famotidine, gi cocktail, HYDROcodone-acetaminophen, lidocaine, ondansetron (ZOFRAN) IV, promethazine   Vital Signs    Vitals:   04/19/16 2352 04/20/16 0402 04/20/16 0937 04/20/16 0952  BP:  140/68 128/60   Pulse: 70 (!) 56 (!) 59   Resp: 20 18    Temp:  97.8 F (36.6 C)    TempSrc:  Oral    SpO2: 96% 94%  92%  Weight:  190 lb 9.6 oz (86.5 kg)    Height:        Intake/Output Summary (Last 24 hours) at 04/20/16 1140 Last data filed at 04/20/16 1000  Gross per 24 hour  Intake          2235.25 ml  Output             1725 ml  Net           510.25 ml   Filed Weights   04/18/16 2103 04/19/16 0433 04/20/16 0402  Weight: 196 lb (88.9 kg) 189 lb 1.6 oz (85.8 kg) 190 lb 9.6 oz (86.5 kg)    Telemetry    - Personally Reviewed  ECG     - Personally Reviewed  Physical Exam   GEN: No acute distress.   Neck: No JVD Cardiac: RRR, no murmurs, rubs, or gallops.  Respiratory: Clear to auscultation bilaterally. GI: Soft, nontender, obese, non-distended  MS:  No edema; No deformity. Neuro:  Nonfocal  Psych: Normal affect   Labs    Chemistry Recent Labs Lab 04/18/16 2208 04/19/16 0428  NA 140 137  K 3.6 4.0  CL 101 101  CO2 28 24  GLUCOSE 81 202*  BUN 13 14  CREATININE 1.09* 1.01*  CALCIUM 9.6 9.1  GFRNONAA 52* 57*  GFRAA 60* >60  ANIONGAP 11 12     Hematology Recent Labs Lab 04/18/16 2208 04/19/16 0428 04/20/16 0510  WBC 9.4 9.8 8.1  RBC 3.50* 3.53* 3.36*  HGB 9.9* 9.7* 9.3*  HCT 32.9* 32.9* 31.4*  MCV 94.0 93.2 93.5  MCH 28.3 27.5 27.7  MCHC 30.1 29.5* 29.6*  RDW 21.8* 21.2* 21.0*  PLT 215 240 242    Cardiac Enzymes Recent Labs Lab 04/19/16 0428 04/19/16 1039 04/20/16 0912  TROPONINI 2.90* 4.98* 2.35*    Recent Labs Lab 04/18/16 2215 04/19/16 0043 04/19/16 0345  TROPIPOC 0.07 0.77* 2.26*     BNPNo results for input(s): BNP, PROBNP in the last 168 hours.   DDimer  Recent Labs Lab 04/19/16 0050  DDIMER  0.51*     Radiology    Dg Chest 2 View  Result Date: 04/18/2016 CLINICAL DATA:  Mid chest pain x2 hours for chest swelling. EXAM: CHEST  2 VIEW COMPARISON:  06/27/2015 FINDINGS: Stable cardiomegaly. The aorta is atherosclerotic without aneurysm. Mild interstitial edema without pneumonic consolidation, effusion or pneumothorax. The patient is status post median sternotomy. Old right-sided rib fractures are noted with healing. Plate and screw fixation of the mid clavicle. Vascular clips are seen at along the right side of the neck. IMPRESSION: Mild interstitial edema. Aortic atherosclerosis. Multiple chronic right-sided rib fractures. Plate and screw fixation of the mid right clavicle. Electronically Signed   By: Ashley Royalty M.D.   On: 04/18/2016 22:09   Ct Head Wo Contrast  Result Date: 04/18/2016 CLINICAL DATA:  Headache and left upper extremity pain for 2 weeks. EXAM: CT HEAD WITHOUT CONTRAST TECHNIQUE: Contiguous axial images were obtained from the base of the skull through the vertex without  intravenous contrast. COMPARISON:  01/07/2013 FINDINGS: Brain: There is no intracranial hemorrhage, mass or evidence of acute infarction. There is no extra-axial fluid collection. Gray matter and white matter appear normal. Cerebral volume is normal for age. Brainstem and posterior fossa are unremarkable. The CSF spaces appear normal. Vascular: No hyperdense vessel or unexpected calcification. Skull: Normal. Negative for fracture or focal lesion. Sinuses/Orbits: No acute finding. Other: None. IMPRESSION: Normal brain Electronically Signed   By: Andreas Newport M.D.   On: 04/18/2016 22:11    Cardiac Studies   Echocardiogram pending  Patient Profile     66 y.o. female with IDDM, tobacco abuse, bioprosthetic MVR, paroxysmal atrial fibrillation, and h/o GI bleed presented with chest pain and found to have NSTEMI, troponins up to 4.9.  Assessment & Plan    1. NSTEMI: Symptomatically stable. Echocardiogram ordered and is pending. Coronaries normal in 2012. Troponins peaked at 4.9. Hgb is stable. Will tentatively plan for coronary angiography on 04/21/16. Has a h/o GI bleed. On IV heparin. Continue ASA, metoprolol, and Crestor. Could potentially use bare metal stent and shorter duration of dual antiplatelet therapy depending upon location of potential lesion.  2. Bioprosthetic MVR: Stable.  3. Paroxysmal atrial fibrillation: Warfarin stopped in recent past due to GI bleed and was put on ASA. Currently on IV heparin. Hgb stable.   4. H/o GI bleed: Hgb stable. On ASA and IV heparin at present.  Signed, Kate Sable, MD  04/20/2016, 11:40 AM

## 2016-04-20 NOTE — Progress Notes (Signed)
Patient stated she would place herself on cpap when ready for bed. RT informed patient to call if she needs assistance.  RT filled humidifier with sterile water.

## 2016-04-20 NOTE — Progress Notes (Signed)
Pt is alert and oriented sitting on the side of the bed drinking coffee talking to family on the phone. No distress or pain. IV dry and intact with hep drip running at 61m/hr. For cath and elevated troponin's.

## 2016-04-21 ENCOUNTER — Encounter (HOSPITAL_COMMUNITY)
Admission: EM | Disposition: A | Discharge status: Skilled Nursing Facility | Payer: Self-pay | Source: Home / Self Care | Attending: Student in an Organized Health Care Education/Training Program

## 2016-04-21 ENCOUNTER — Inpatient Hospital Stay (HOSPITAL_COMMUNITY): Payer: PPO

## 2016-04-21 DIAGNOSIS — I2510 Atherosclerotic heart disease of native coronary artery without angina pectoris: Secondary | ICD-10-CM

## 2016-04-21 DIAGNOSIS — I213 ST elevation (STEMI) myocardial infarction of unspecified site: Secondary | ICD-10-CM

## 2016-04-21 HISTORY — PX: LEFT HEART CATH AND CORONARY ANGIOGRAPHY: CATH118249

## 2016-04-21 LAB — ECHOCARDIOGRAM COMPLETE
Area-P 1/2: 2.14 cm2
E decel time: 259 msec
FS: 26 % — AB (ref 28–44)
Height: 62 in
IVS/LV PW RATIO, ED: 0.88
LA ID, A-P, ES: 57 mm
LA diam end sys: 57 mm
LA diam index: 3.03 cm/m2
LA vol A4C: 64.4 ml
LA vol index: 37.9 mL/m2
LA vol: 71.3 mL
LV PW d: 12.7 mm — AB (ref 0.6–1.1)
LVOT SV: 71 mL
LVOT VTI: 25 cm
LVOT area: 2.84 cm2
LVOT diameter: 19 mm
LVOT peak vel: 86.4 cm/s
Lateral S' vel: 7.26 cm/s
MV Dec: 259
MV Peak grad: 14 mmHg
MV pk A vel: 81.3 m/s
MV pk E vel: 187 m/s
P 1/2 time: 97 ms
RV sys press: 76 mmHg
Reg peak vel: 422 cm/s
TAPSE: 21.9 mm
TR max vel: 422 cm/s
Weight: 3094.4 oz

## 2016-04-21 LAB — CBC
HCT: 28.5 % — ABNORMAL LOW (ref 36.0–46.0)
Hemoglobin: 8.8 g/dL — ABNORMAL LOW (ref 12.0–15.0)
MCH: 28.7 pg (ref 26.0–34.0)
MCHC: 30.9 g/dL (ref 30.0–36.0)
MCV: 92.8 fL (ref 78.0–100.0)
Platelets: 168 10*3/uL (ref 150–400)
RBC: 3.07 MIL/uL — ABNORMAL LOW (ref 3.87–5.11)
RDW: 21.1 % — ABNORMAL HIGH (ref 11.5–15.5)
WBC: 6.6 10*3/uL (ref 4.0–10.5)

## 2016-04-21 LAB — BASIC METABOLIC PANEL
Anion gap: 8 (ref 5–15)
BUN: 20 mg/dL (ref 6–20)
CO2: 24 mmol/L (ref 22–32)
Calcium: 8.8 mg/dL — ABNORMAL LOW (ref 8.9–10.3)
Chloride: 103 mmol/L (ref 101–111)
Creatinine, Ser: 1.13 mg/dL — ABNORMAL HIGH (ref 0.44–1.00)
GFR calc Af Amer: 57 mL/min — ABNORMAL LOW (ref >60)
GFR calc non Af Amer: 50 mL/min — ABNORMAL LOW (ref >60)
Glucose, Bld: 213 mg/dL — ABNORMAL HIGH (ref 65–99)
Potassium: 4.2 mmol/L (ref 3.5–5.1)
Sodium: 135 mmol/L (ref 135–145)

## 2016-04-21 LAB — PROTIME-INR
INR: 1.07
Prothrombin Time: 14 seconds (ref 11.4–15.2)

## 2016-04-21 LAB — GLUCOSE, CAPILLARY
Glucose-Capillary: 223 mg/dL — ABNORMAL HIGH (ref 65–99)
Glucose-Capillary: 194 mg/dL — ABNORMAL HIGH (ref 65–99)
Glucose-Capillary: 152 mg/dL — ABNORMAL HIGH (ref 65–99)
Glucose-Capillary: 186 mg/dL — ABNORMAL HIGH (ref 65–99)
Glucose-Capillary: 263 mg/dL — ABNORMAL HIGH (ref 65–99)

## 2016-04-21 LAB — HEPARIN LEVEL (UNFRACTIONATED): Heparin Unfractionated: 0.39 IU/mL (ref 0.30–0.70)

## 2016-04-21 SURGERY — LEFT HEART CATH AND CORONARY ANGIOGRAPHY
Anesthesia: LOCAL

## 2016-04-21 MED ORDER — DICLOFENAC SODIUM 1 % TD GEL
2.0000 g | Freq: Four times a day (QID) | TRANSDERMAL | Status: DC | PRN
  Filled 2016-04-21: qty 100

## 2016-04-21 MED ORDER — FENTANYL CITRATE (PF) 100 MCG/2ML IJ SOLN
INTRAMUSCULAR | Status: DC | PRN
  Administered 2016-04-21: 25 ug via INTRAVENOUS

## 2016-04-21 MED ORDER — SODIUM CHLORIDE 0.9 % IV SOLN: INTRAVENOUS | Status: AC

## 2016-04-21 MED ORDER — LIDOCAINE HCL (PF) 1 % IJ SOLN
INTRAMUSCULAR | Status: DC | PRN
  Administered 2016-04-21: 2 mL

## 2016-04-21 MED ORDER — NITROGLYCERIN 1 MG/10 ML FOR IR/CATH LAB
INTRA_ARTERIAL | Status: AC
  Filled 2016-04-21: qty 10

## 2016-04-21 MED ORDER — ONDANSETRON HCL 4 MG/2ML IJ SOLN: 4.0000 mg | Freq: Four times a day (QID) | INTRAMUSCULAR | Status: DC | PRN

## 2016-04-21 MED ORDER — SODIUM CHLORIDE 0.9% FLUSH
3.0000 mL | Freq: Two times a day (BID) | INTRAVENOUS | Status: DC
  Administered 2016-04-22 – 2016-04-25 (×4): 3 mL via INTRAVENOUS

## 2016-04-21 MED ORDER — IOPAMIDOL (ISOVUE-370) INJECTION 76%
INTRAVENOUS | Status: DC | PRN
  Administered 2016-04-21: 50 mL via INTRA_ARTERIAL

## 2016-04-21 MED ORDER — ENOXAPARIN SODIUM 40 MG/0.4ML ~~LOC~~ SOLN
40.0000 mg | Freq: Every day | SUBCUTANEOUS | Status: DC
  Filled 2016-04-21: qty 0.4

## 2016-04-21 MED ORDER — MIDAZOLAM HCL 2 MG/2ML IJ SOLN
INTRAMUSCULAR | Status: DC | PRN
  Administered 2016-04-21: 1 mg via INTRAVENOUS

## 2016-04-21 MED ORDER — FENTANYL CITRATE (PF) 100 MCG/2ML IJ SOLN
INTRAMUSCULAR | Status: AC
  Filled 2016-04-21: qty 2

## 2016-04-21 MED ORDER — SODIUM CHLORIDE 0.9% FLUSH: 3.0000 mL | INTRAVENOUS | Status: DC | PRN

## 2016-04-21 MED ORDER — IOPAMIDOL (ISOVUE-370) INJECTION 76%
INTRAVENOUS | Status: AC
  Filled 2016-04-21: qty 100

## 2016-04-21 MED ORDER — VERAPAMIL HCL 2.5 MG/ML IV SOLN
INTRA_ARTERIAL | Status: DC | PRN
  Administered 2016-04-21: 15:00:00 via INTRA_ARTERIAL

## 2016-04-21 MED ORDER — ASPIRIN 81 MG PO CHEW
81.0000 mg | CHEWABLE_TABLET | Freq: Every day | ORAL | Status: DC
  Administered 2016-04-22 – 2016-04-25 (×3): 81 mg via ORAL
  Filled 2016-04-21 (×3): qty 1

## 2016-04-21 MED ORDER — SODIUM CHLORIDE 0.9 % IV SOLN: 250.0000 mL | INTRAVENOUS | Status: DC | PRN

## 2016-04-21 MED ORDER — ACETAMINOPHEN 325 MG PO TABS: 650.0000 mg | ORAL_TABLET | ORAL | Status: DC | PRN

## 2016-04-21 MED ORDER — VERAPAMIL HCL 2.5 MG/ML IV SOLN
INTRAVENOUS | Status: AC
  Filled 2016-04-21: qty 2

## 2016-04-21 MED ORDER — LIDOCAINE HCL (PF) 1 % IJ SOLN
INTRAMUSCULAR | Status: AC
  Filled 2016-04-21: qty 30

## 2016-04-21 MED ORDER — VERAPAMIL HCL 2.5 MG/ML IV SOLN: INTRAVENOUS | Status: DC | PRN

## 2016-04-21 MED ORDER — MIDAZOLAM HCL 2 MG/2ML IJ SOLN
INTRAMUSCULAR | Status: AC
  Filled 2016-04-21: qty 2

## 2016-04-21 MED ORDER — HEPARIN SODIUM (PORCINE) 1000 UNIT/ML IJ SOLN
INTRAMUSCULAR | Status: DC | PRN
  Administered 2016-04-21: 4000 [IU] via INTRAVENOUS

## 2016-04-21 MED ORDER — GI COCKTAIL ~~LOC~~
30.0000 mL | Freq: Three times a day (TID) | ORAL | Status: DC | PRN
  Administered 2016-04-21 – 2016-04-23 (×3): 30 mL via ORAL
  Filled 2016-04-21 (×3): qty 30

## 2016-04-21 MED ORDER — HEPARIN (PORCINE) IN NACL 2-0.9 UNIT/ML-% IJ SOLN
INTRAMUSCULAR | Status: DC | PRN
  Administered 2016-04-21: 1000 mL via INTRA_ARTERIAL

## 2016-04-21 MED ORDER — HEPARIN SODIUM (PORCINE) 1000 UNIT/ML IJ SOLN
INTRAMUSCULAR | Status: AC
  Filled 2016-04-21: qty 1

## 2016-04-21 SURGICAL SUPPLY — 10 items
CATH EXPO 5FR ANG PIGTAIL 145 (CATHETERS) ×1 IMPLANT
CATH OPTITORQUE TIG 4.0 5F (CATHETERS) ×1 IMPLANT
GLIDESHEATH SLEND A-KIT 6F 22G (SHEATH) ×1 IMPLANT
GUIDEWIRE INQWIRE 1.5J.035X260 (WIRE) IMPLANT
INQWIRE 1.5J .035X260CM (WIRE) ×2
KIT HEART LEFT (KITS) ×2 IMPLANT
PACK CARDIAC CATHETERIZATION (CUSTOM PROCEDURE TRAY) ×2 IMPLANT
TRANSDUCER W/STOPCOCK (MISCELLANEOUS) ×2 IMPLANT
TUBING CIL FLEX 10 FLL-RA (TUBING) ×2 IMPLANT
WIRE HI TORQ VERSACORE-J 145CM (WIRE) ×1 IMPLANT

## 2016-04-21 NOTE — Progress Notes (Signed)
Inpatient Diabetes Program Recommendations  AACE/ADA: New Consensus Statement on Inpatient Glycemic Control (2015)  Target Ranges:  Prepandial:   less than 140 mg/dL      Peak postprandial:   less than 180 mg/dL (1-2 hours)      Critically ill patients:  140 - 180 mg/dL   Results for [name], [name] (MRN [id number]) as of 04/21/2016 10:05  Ref. Range 04/20/2016 07:43 04/20/2016 12:07 04/20/2016 16:14 04/20/2016 21:43  Glucose-Capillary Latest Ref Range: 65 - 99 mg/dL 203 (H) 247 (H) 267 (H) 295 (H)   Results for [name], [name] (MRN [id number]) as of 04/21/2016 10:05  Ref. Range 04/21/2016 07:42  Glucose-Capillary Latest Ref Range: 65 - 99 mg/dL 194 (H)    Home DM Meds: Levemir 45 units daily       Novolog 18 units TID with meals       Novolog SSI   Current Insulin Orders: Levemir 30 units QHS      Novolog Moderate Correction Scale/ SSI (0-15 units) TID AC      MD- Please consider the following in-hospital insulin adjustments after patient has procedure today:  1. Increase Levemir 40 units QHS (patient takes 45 units daily at home)  2. Start Novolog Meal Coverage: Novolog 6 units TID with meals (hold if pt eats <50% of meal)     --Will follow patient during hospitalization--  Wyn Quaker RN, MSN, CDE Diabetes Coordinator Inpatient Glycemic Control Team Team Pager: [phone] (8a-5p)

## 2016-04-21 NOTE — Progress Notes (Signed)
Progress Note  Patient Name: [name] Date of Encounter: 04/21/2016  Primary Cardiologist: Dr. Stanford Breed  Subjective   No chest pain or dyspnea.   Inpatient Medications    Scheduled Meds: . aspirin EC  325 mg Oral Daily  . ferrous gluconate  324 mg Oral BID WC  . FLUoxetine  40 mg Oral Daily  . fluticasone  2 spray Each Nare QHS  . gabapentin  600 mg Oral TID  . insulin aspart  0-15 Units Subcutaneous TID WC  . insulin detemir  30 Units Subcutaneous QHS  . metoprolol tartrate  25 mg Oral BID  . mometasone-formoterol  2 puff Inhalation BID  . nicotine  21 mg Transdermal Daily  . rosuvastatin  20 mg Oral QHS  . sodium chloride flush  3 mL Intravenous Q12H  . tiotropium  18 mcg Inhalation Daily   Continuous Infusions: . sodium chloride 1 mL/kg/hr (04/21/16 0820)  . heparin 1,600 Units/hr (04/21/16 0817)   PRN Meds: sodium chloride, acetaminophen, albuterol, ALPRAZolam, cyclobenzaprine, famotidine, HYDROcodone-acetaminophen, lidocaine, ondansetron (ZOFRAN) IV, promethazine, sodium chloride flush   Vital Signs    Vitals:   04/21/16 0145 04/21/16 0410 04/21/16 0600 04/21/16 0743  BP:  132/65    Pulse:  (!) 52    Resp:  18    Temp:  97.9 F (36.6 C)    TempSrc:  Oral    SpO2: 97% 98%  96%  Weight:  185 lb 4.8 oz (84.1 kg) 193 lb 6.4 oz (87.7 kg)   Height:   5' 2" (1.575 m)     Intake/Output Summary (Last 24 hours) at 04/21/16 1020 Last data filed at 04/21/16 0840  Gross per 24 hour  Intake           703.03 ml  Output             1550 ml  Net          -846.97 ml   Filed Weights   04/20/16 0402 04/21/16 0410 04/21/16 0600  Weight: 190 lb 9.6 oz (86.5 kg) 185 lb 4.8 oz (84.1 kg) 193 lb 6.4 oz (87.7 kg)    Telemetry    NSR at rate of 50s - Personally Reviewed  ECG    NSR with 1st degree AV block - Personally Reviewed  Physical Exam   GEN: No acute distress.   Neck: No JVD Cardiac: RRR, no murmurs, rubs, or gallops.  Trace BL LE  edema Respiratory: Faint rales  GI: Soft, nontender, non-distended  MS: No edema; No deformity. Neuro:  Nonfocal  Psych: Normal affect   Labs    Chemistry Recent Labs Lab 04/18/16 2208 04/19/16 0428 04/21/16 0527  NA 140 137 135  K 3.6 4.0 4.2  CL 101 101 103  CO2 _0 GLUCOSE 81 202* 213*  BUN _1 CREATININE 1.09* 1.01* 1.13*  CALCIUM 9.6 9.1 8.8*  GFRNONAA 52* 57* 50*  GFRAA 60* >60 57*  ANIONGAP _2 Hematology Recent Labs Lab 04/19/16 0428 04/20/16 0510 04/21/16 0527  WBC 9.8 8.1 6.6  RBC 3.53* 3.36* 3.07*  HGB 9.7* 9.3* 8.8*  HCT 32.9* 31.4* 28.5*  MCV 93.2 93.5 92.8  MCH 27.5 27.7 28.7  MCHC 29.5* 29.6* 30.9  RDW 21.2* 21.0* 21.1*  PLT 240 242 168    Cardiac Enzymes Recent Labs Lab 04/19/16 0428 04/19/16 1039 04/20/16 0912  TROPONINI 2.90* 4.98* 2.35*    Recent Labs Lab 04/18/16  2215 04/19/16 0043 04/19/16 0345  TROPIPOC 0.07 0.77* 2.26*     BNPNo results for input(s): BNP, PROBNP in the last 168 hours.   DDimer  Recent Labs Lab 04/19/16 0050  DDIMER 0.51*     Radiology    No results found.  Cardiac Studies   Pending echo reading For cath today   Patient Profile     66 y/o obese female with h/o IDDM, ongoing tobacco abuse, mitral valve replacement in 2012, h/o PAF, previously on coumadin, recent GIB 03/2016 and coumadin stopped. Admitted for chest and left arm pain. Found to have an elevated troponin w/c NSTEMI.    Assessment & Plan    1. NSTEMI: Symptomatically stable. Echocardiogram is done, pending reading. Coronaries normal in 2012. Troponins peaked at 4.9, now trending down. Hgb is stable. For coronary angiography today. Has a h/o GI bleed. On IV heparin. Continue ASA, metoprolol, and Crestor. Could potentially use bare metal stent and shorter duration of dual antiplatelet therapy depending upon location of potential lesion.  2. Bioprosthetic MVR: Stable. Pending echo reading  3. Paroxysmal atrial  fibrillation: Warfarin stopped 03/2016 due to GI bleed and was put on ASA. Currently on IV heparin. Hgb stable. Rate in 50s with intermittently going to low 40s.   4. H/o GI bleed: Hgb stable. On ASA and IV heparin at present. Denies blood in her stool.   5. HLD - 04/19/2016: Cholesterol 103; HDL 28; LDL Cholesterol 29; Triglycerides 232; VLDL 46  - Continue Crestor  6. Tobacco abuse - On nicotine patch  Signed, Bhagat,Bhavinkumar, PA  04/21/2016, 10:20 AM    Agree with note by Robbie Lis PA-C  Recent GI bleeds requiring transfusion with endoscopy revealing AVMs. She has status post bioprosthetic MVR with cath performed 5 or 6 years ago that showed normal coronary arteries. She was on Coumadin which was discontinued and changed aspirin. She came in with chest pain. Enzymes were positive troponin in the 4 range. She is scheduled for cardiac catheterization today.  Lorretta Harp, M.D., [place], [facility], [place] [address] [place], [place]  [address]  [phone] 04/21/2016 11:05 AM

## 2016-04-21 NOTE — Progress Notes (Signed)
TR band removed at 1710. A 2x2 guaze with tegaderm was applied. Site remains level 0. R pulses remain 2+, extremity is warm to touch, normal color for ethnicity. Vital signs stable. Pt instructed to not put any pressure or use arm. Pt instructed to call if arm begins to bleed. Pt verbalized understanding. Will continue to monitor pt.

## 2016-04-21 NOTE — Progress Notes (Signed)
   Subjective:  No acute events overnight. No recurrence of her previous chest pain or dyspnea however is complaining of significant reflux and heartburn symptoms and feeling very anxious before her planned cardiac cath procedure scheduled for today.   Objective:  Vital signs in last 24 hours: Vitals:   04/21/16 0145 04/21/16 0410 04/21/16 0600 04/21/16 0743  BP:  132/65    Pulse:  (!) 52    Resp:  18    Temp:  97.9 F (36.6 C)    TempSrc:  Oral    SpO2: 97% 98%  96%  Weight:  185 lb 4.8 oz (84.1 kg) 193 lb 6.4 oz (87.7 kg)   Height:   _0  (1.575 m)    General: Elderly woman sitting in bedside chair, anxious and conversational HEENT: [place]/AT, moist mucus membranes CV: Slow rate, irregular rhythm, no m/g/r Pulm/Chest: CTA bil, normal work of breathing, moderate sternal tenderness to palpation Ext: No peripheral edema. Warm and well-perfused Neuro: alert and oriented x 3  TTE on 3/12 - LVEF 60-65%, RV dilated and hypokinetic with severely increased pulmonary artery pressure (estimated 76 mmHg)  LHC on 3/12 - Ost RCA lesion, 40 %stenosed. Ost LM lesion, 30 %stenosed. Minimal CAD   Assessment/Plan:  NSTEMI: Troponins peaked at 4.98 on 3/10 and trended back down. On heparin gtt over the weekend and has been without chest pain. Underwent LHC today 3/12 - minimal CAD and no culprit lesion. Likely due to stress-induced septal subendocardial ischemia potentially from anemia per cardiology.  - Appreciate Cardiology recs - Continue ASA, Crestor  HFpEF: Echo in 2014 with normal EF, mild LVH, and severely dilated left atrium. Not volume overloaded currently. TTE on 3/12 shows LVEF 60-65%, RV dilated and hypokinetic with severely increased pulmonary artery pressure (estimated 76 mmHg). More consistent with right sided heart failure.  - Hold home Lasix with NSTEMI   Iron deficiency anemia: Recent hospitalization for GI bleed secondary to AVM. Hb 8.8 today from 9.3 yesterday. - Follow CBC -  Continue home iron   GERD, persistent reflux symptoms and chest wall pain that does not seem to respond to Pepcid or Protonix -- GI cocktail TID PRN -- Voltaren gel QID PRN -- Pepcid 20 mg BID PRN -- Zofran and Phenergan PRN for nausea  PAF and s/p mitral valve repair, bioprosthetic MVR: Coumadin discontinued after GI bleed as above. Has porcine valve. CHADSVASC score 6. Valve functioning normally on TTE today. -- Metoprolol 25 mg BID -- Continue home ASA   Type 2 DM: CBGs uncontrolled yesterday in high 200s, increase levemir from 20 to 30 QHS, was NPO much of today, now on liquid diet. Last A1c 7.6 in 12/2015 - Continue Levemir 30 units QHS - Continue moderate ISS  COPD/OSA: Stable.  - Continue home Advair and Spiriva - CPAP QHS  Depression/Anxiety: - Continue home Fluoxetine and Alprazolam   Chronic Pain/Fibromyalgia:  - Continue home Norco, Flexeril  Tobacco Abuse: Smokes 1 pack per day. - Nicotine patch  Diet: Heart healthy  DVT PPx: Heparin gtt  Dispo: Anticipated discharge in approximately 1-2 day(s).   Asencion Partridge, MD 04/21/2016, 10:11 AM Pager: [phone]

## 2016-04-21 NOTE — Progress Notes (Signed)
Pt is siting on the side of the bed put Capa on and helped to bed.

## 2016-04-21 NOTE — Progress Notes (Signed)
Up to the [facility] and weight complete.

## 2016-04-21 NOTE — Progress Notes (Signed)
04/21/16  1640  Paged Dr Gwenlyn Found to clarify if ASA should be given twice today. Waiting on a response.

## 2016-04-21 NOTE — Procedures (Signed)
CPAP is bedside and ready for use. Patient said she would place on herself when ready for bed.  Patient is aware if she needs further assistance to ask RN to call Respiratory.

## 2016-04-21 NOTE — Consult Note (Signed)
   [facility] CM Inpatient Consult   04/21/2016  [name] 08-May-1950 [id number]   Active patient with [facility] assessed for re-admission. 66 year old woman with recent admission for GI bleeding due to a small intestine telangiectasia came to the emergency department by EMS last night because of a strange sensation in her chest. She has difficulty describing this sensation, says it's like a pain in her lower chest but then also radiates and tingles down her arms and legs. She had not felt before. It came on while she was at rest and was not affected by exertion. It is currently much improved from when it started yesterday. Denies any recent illnesses, no fevers or chills. She has been doing well since her discharge last month. Only medication change was discontinuing Coumadin and starting aspirin. She has a history of a mitral valve replacement with a porcine valve in 2012. Denies any history of ischemic heart disease or CVA. She does continue to smoke about 1 pack per day.  Spoke with the patient at the bedside.  Patient states she would like ongoing follow up.  She states she will call the [facility] social worker. Active consent on file noted.  Patient is getting ready for a procedure, [facility] to continue to follow up for disposition needs.  Natividad Brood, RN BSN [facility] Liaison  [phone] business mobile phone Toll free office [phone]

## 2016-04-21 NOTE — Progress Notes (Signed)
  Echocardiogram 2D Echocardiogram has been performed.  [name] 04/21/2016, 9:32 AM

## 2016-04-21 NOTE — Progress Notes (Signed)
ANTICOAGULATION CONSULT NOTE - Follow-Up  Pharmacy Consult for Heparin Indication: chest pain/ACS  Allergies  Allergen Reactions  . Morphine And Related Other (See Comments)    Injection site reaction  . Oxycontin [Oxycodone] Other (See Comments)    headache  . Tramadol Hcl Swelling    Ankle swelling  . Lorazepam Other (See Comments)    Patient's sister noted that ativan caused the patient to become extremely confused during hospitalization 09/2010; tolerates Xanax    Patient Measurements: Height: _0  (157.5 cm) Weight: 193 lb 6.4 oz (87.7 kg) IBW/kg (Calculated) : 50.1 Heparin Dosing Weight: 69.6 kg  Vital Signs: Temp: 97.9 F (36.6 C) (03/12 0410) Temp Source: Oral (03/12 0410) BP: 132/65 (03/12 0410) Pulse Rate: 52 (03/12 0410)  Labs:  Recent Labs  04/18/16 2208 04/19/16 0428 04/19/16 1039  04/20/16 0510 04/20/16 0912 04/20/16 1411 04/20/16 2120 04/21/16 0527  HGB 9.9* 9.7*  --   --  9.3*  --   --   --  8.8*  HCT 32.9* 32.9*  --   --  31.4*  --   --   --  28.5*  PLT 215 240  --   --  242  --   --   --  168  LABPROT  --   --   --   --   --   --   --   --  14.0  INR  --   --   --   --   --   --   --   --  1.07  HEPARINUNFRC  --   --  <0.10*  < > 0.24*  --  0.20* 0.31 0.39  CREATININE 1.09* 1.01*  --   --   --   --   --   --  1.13*  TROPONINI  --  2.90* 4.98*  --   --  2.35*  --   --   --   < > = values in this interval not displayed.  Estimated Creatinine Clearance: 50.3 mL/min (by C-G formula based on SCr of 1.13 mg/dL (H)).   Assessment: 65 y.o. F with on heparin for NSTEMI. Heparin level is therapeutic (0.39) on gtt at 1600 units/hr. No bleeding noted. Plan for cath today  Goal of Therapy:  Heparin level 0.3-0.7 units/ml Monitor platelets by anticoagulation protocol: Yes   Plan:  Continue heparin 1600 units/hr Daily heparin level and CBC F/u after cath  Maryanna Shape, PharmD, BCPS  Clinical Pharmacist  Pager: [phone]   04/21/2016,10:29 AM

## 2016-04-21 NOTE — H&P (View-Only) (Signed)
Progress Note  Patient Name: [name] Date of Encounter: 04/21/2016  Primary Cardiologist: Dr. Stanford Breed  Subjective   No chest pain or dyspnea.   Inpatient Medications    Scheduled Meds: . aspirin EC  325 mg Oral Daily  . ferrous gluconate  324 mg Oral BID WC  . FLUoxetine  40 mg Oral Daily  . fluticasone  2 spray Each Nare QHS  . gabapentin  600 mg Oral TID  . insulin aspart  0-15 Units Subcutaneous TID WC  . insulin detemir  30 Units Subcutaneous QHS  . metoprolol tartrate  25 mg Oral BID  . mometasone-formoterol  2 puff Inhalation BID  . nicotine  21 mg Transdermal Daily  . rosuvastatin  20 mg Oral QHS  . sodium chloride flush  3 mL Intravenous Q12H  . tiotropium  18 mcg Inhalation Daily   Continuous Infusions: . sodium chloride 1 mL/kg/hr (04/21/16 0820)  . heparin 1,600 Units/hr (04/21/16 0817)   PRN Meds: sodium chloride, acetaminophen, albuterol, ALPRAZolam, cyclobenzaprine, famotidine, HYDROcodone-acetaminophen, lidocaine, ondansetron (ZOFRAN) IV, promethazine, sodium chloride flush   Vital Signs    Vitals:   04/21/16 0145 04/21/16 0410 04/21/16 0600 04/21/16 0743  BP:  132/65    Pulse:  (!) 52    Resp:  18    Temp:  97.9 F (36.6 C)    TempSrc:  Oral    SpO2: 97% 98%  96%  Weight:  185 lb 4.8 oz (84.1 kg) 193 lb 6.4 oz (87.7 kg)   Height:   5' 2" (1.575 m)     Intake/Output Summary (Last 24 hours) at 04/21/16 1020 Last data filed at 04/21/16 0840  Gross per 24 hour  Intake           703.03 ml  Output             1550 ml  Net          -846.97 ml   Filed Weights   04/20/16 0402 04/21/16 0410 04/21/16 0600  Weight: 190 lb 9.6 oz (86.5 kg) 185 lb 4.8 oz (84.1 kg) 193 lb 6.4 oz (87.7 kg)    Telemetry    NSR at rate of 50s - Personally Reviewed  ECG    NSR with 1st degree AV block - Personally Reviewed  Physical Exam   GEN: No acute distress.   Neck: No JVD Cardiac: RRR, no murmurs, rubs, or gallops.  Trace BL LE  edema Respiratory: Faint rales  GI: Soft, nontender, non-distended  MS: No edema; No deformity. Neuro:  Nonfocal  Psych: Normal affect   Labs    Chemistry Recent Labs Lab 04/18/16 2208 04/19/16 0428 04/21/16 0527  NA 140 137 135  K 3.6 4.0 4.2  CL 101 101 103  CO2 _0 GLUCOSE 81 202* 213*  BUN _1 CREATININE 1.09* 1.01* 1.13*  CALCIUM 9.6 9.1 8.8*  GFRNONAA 52* 57* 50*  GFRAA 60* >60 57*  ANIONGAP _2 Hematology Recent Labs Lab 04/19/16 0428 04/20/16 0510 04/21/16 0527  WBC 9.8 8.1 6.6  RBC 3.53* 3.36* 3.07*  HGB 9.7* 9.3* 8.8*  HCT 32.9* 31.4* 28.5*  MCV 93.2 93.5 92.8  MCH 27.5 27.7 28.7  MCHC 29.5* 29.6* 30.9  RDW 21.2* 21.0* 21.1*  PLT 240 242 168    Cardiac Enzymes Recent Labs Lab 04/19/16 0428 04/19/16 1039 04/20/16 0912  TROPONINI 2.90* 4.98* 2.35*    Recent Labs Lab 04/18/16  2215 04/19/16 0043 04/19/16 0345  TROPIPOC 0.07 0.77* 2.26*     BNPNo results for input(s): BNP, PROBNP in the last 168 hours.   DDimer  Recent Labs Lab 04/19/16 0050  DDIMER 0.51*     Radiology    No results found.  Cardiac Studies   Pending echo reading For cath today   Patient Profile     66 y/o obese female with h/o IDDM, ongoing tobacco abuse, mitral valve replacement in 2012, h/o PAF, previously on coumadin, recent GIB 03/2016 and coumadin stopped. Admitted for chest and left arm pain. Found to have an elevated troponin w/c NSTEMI.    Assessment & Plan    1. NSTEMI: Symptomatically stable. Echocardiogram is done, pending reading. Coronaries normal in 2012. Troponins peaked at 4.9, now trending down. Hgb is stable. For coronary angiography today. Has a h/o GI bleed. On IV heparin. Continue ASA, metoprolol, and Crestor. Could potentially use bare metal stent and shorter duration of dual antiplatelet therapy depending upon location of potential lesion.  2. Bioprosthetic MVR: Stable. Pending echo reading  3. Paroxysmal atrial  fibrillation: Warfarin stopped 03/2016 due to GI bleed and was put on ASA. Currently on IV heparin. Hgb stable. Rate in 50s with intermittently going to low 40s.   4. H/o GI bleed: Hgb stable. On ASA and IV heparin at present. Denies blood in her stool.   5. HLD - 04/19/2016: Cholesterol 103; HDL 28; LDL Cholesterol 29; Triglycerides 232; VLDL 46  - Continue Crestor  6. Tobacco abuse - On nicotine patch  Signed, Bhagat,Bhavinkumar, PA  04/21/2016, 10:20 AM    Agree with note by Robbie Lis PA-C  Recent GI bleeds requiring transfusion with endoscopy revealing AVMs. She has status post bioprosthetic MVR with cath performed 5 or 6 years ago that showed normal coronary arteries. She was on Coumadin which was discontinued and changed aspirin. She came in with chest pain. Enzymes were positive troponin in the 4 range. She is scheduled for cardiac catheterization today.  Lorretta Harp, M.D., [place], [facility], [place] [address] [place], [place]  [address]  [phone] 04/21/2016 11:05 AM

## 2016-04-21 NOTE — Interval H&P Note (Signed)
Cath Lab Visit (complete for each Cath Lab visit)  Clinical Evaluation Leading to the Procedure:   ACS: Yes.    Non-ACS:    Anginal Classification: CCS III  Anti-ischemic medical therapy: Minimal Therapy (1 class of medications)  Non-Invasive Test Results: No non-invasive testing performed  Prior CABG: No previous CABG      History and Physical Interval Note:  04/21/2016 2:42 PM  Marciel A Speas  has presented today for surgery, with the diagnosis of NSTEMI  The various methods of treatment have been discussed with the patient and family. After consideration of risks, benefits and other options for treatment, the patient has consented to  Procedure(s): Left Heart Cath and Coronary Angiography (N/A) as a surgical intervention .  The patient's history has been reviewed, patient examined, no change in status, stable for surgery.  I have reviewed the patient's chart and labs.  Questions were answered to the patient's satisfaction.     Quay Burow

## 2016-04-22 ENCOUNTER — Encounter (HOSPITAL_COMMUNITY): Payer: Self-pay | Admitting: Cardiovascular Disease

## 2016-04-22 ENCOUNTER — Inpatient Hospital Stay (HOSPITAL_COMMUNITY): Payer: PPO

## 2016-04-22 ENCOUNTER — Telehealth: Payer: Self-pay | Admitting: Internal Medicine

## 2016-04-22 DIAGNOSIS — I2729 Other secondary pulmonary hypertension: Secondary | ICD-10-CM

## 2016-04-22 DIAGNOSIS — I272 Pulmonary hypertension, unspecified: Secondary | ICD-10-CM

## 2016-04-22 DIAGNOSIS — J9611 Chronic respiratory failure with hypoxia: Secondary | ICD-10-CM

## 2016-04-22 LAB — BASIC METABOLIC PANEL
Anion gap: 7 (ref 5–15)
BUN: 18 mg/dL (ref 6–20)
CO2: 25 mmol/L (ref 22–32)
Calcium: 8.9 mg/dL (ref 8.9–10.3)
Chloride: 106 mmol/L (ref 101–111)
Creatinine, Ser: 0.95 mg/dL (ref 0.44–1.00)
GFR calc Af Amer: >60 mL/min (ref >60)
GFR calc non Af Amer: >60 mL/min (ref >60)
Glucose, Bld: 153 mg/dL — ABNORMAL HIGH (ref 65–99)
Potassium: 4.5 mmol/L (ref 3.5–5.1)
Sodium: 138 mmol/L (ref 135–145)

## 2016-04-22 LAB — GLUCOSE, CAPILLARY
Glucose-Capillary: 173 mg/dL — ABNORMAL HIGH (ref 65–99)
Glucose-Capillary: 190 mg/dL — ABNORMAL HIGH (ref 65–99)
Glucose-Capillary: 256 mg/dL — ABNORMAL HIGH (ref 65–99)
Glucose-Capillary: 290 mg/dL — ABNORMAL HIGH (ref 65–99)

## 2016-04-22 LAB — CBC
HCT: 29.2 % — ABNORMAL LOW (ref 36.0–46.0)
Hemoglobin: 8.7 g/dL — ABNORMAL LOW (ref 12.0–15.0)
MCH: 27.9 pg (ref 26.0–34.0)
MCHC: 29.8 g/dL — ABNORMAL LOW (ref 30.0–36.0)
MCV: 93.6 fL (ref 78.0–100.0)
Platelets: 169 10*3/uL (ref 150–400)
RBC: 3.12 MIL/uL — ABNORMAL LOW (ref 3.87–5.11)
RDW: 21.1 % — ABNORMAL HIGH (ref 11.5–15.5)
WBC: 7 10*3/uL (ref 4.0–10.5)

## 2016-04-22 MED ORDER — SODIUM CHLORIDE 0.9 % IV SOLN
INTRAVENOUS | Status: DC
  Administered 2016-04-23: 07:00:00 via INTRAVENOUS

## 2016-04-22 MED ORDER — FUROSEMIDE 40 MG PO TABS
40.0000 mg | ORAL_TABLET | Freq: Two times a day (BID) | ORAL | Status: DC
  Administered 2016-04-22: 40 mg via ORAL
  Filled 2016-04-22: qty 1

## 2016-04-22 MED ORDER — SODIUM CHLORIDE 0.9% FLUSH: 3.0000 mL | Freq: Two times a day (BID) | INTRAVENOUS | Status: DC

## 2016-04-22 MED ORDER — TECHNETIUM TO 99M ALBUMIN AGGREGATED
4.0000 | Freq: Once | INTRAVENOUS | Status: AC | PRN
  Administered 2016-04-22: 4 via INTRAVENOUS

## 2016-04-22 MED ORDER — TECHNETIUM TC 99M DIETHYLENETRIAME-PENTAACETIC ACID: 30.0000 | Freq: Once | INTRAVENOUS | Status: DC | PRN

## 2016-04-22 MED ORDER — SODIUM CHLORIDE 0.9 % IV SOLN: 250.0000 mL | INTRAVENOUS | Status: DC | PRN

## 2016-04-22 MED ORDER — FUROSEMIDE 80 MG PO TABS: 80.0000 mg | ORAL_TABLET | Freq: Every day | ORAL | Status: DC

## 2016-04-22 MED ORDER — FUROSEMIDE 10 MG/ML IJ SOLN
40.0000 mg | Freq: Every day | INTRAMUSCULAR | Status: AC
  Administered 2016-04-22 – 2016-04-23 (×2): 40 mg via INTRAVENOUS
  Filled 2016-04-22 (×2): qty 4

## 2016-04-22 MED ORDER — SODIUM CHLORIDE 0.9% FLUSH: 3.0000 mL | INTRAVENOUS | Status: DC | PRN

## 2016-04-22 MED ORDER — METOPROLOL SUCCINATE ER 25 MG PO TB24
25.0000 mg | ORAL_TABLET | Freq: Every day | ORAL | Status: DC
  Administered 2016-04-23 – 2016-04-25 (×2): 25 mg via ORAL
  Filled 2016-04-22 (×3): qty 1

## 2016-04-22 NOTE — Consult Note (Signed)
  Advanced Heart Failure Team Consult Note  Referring Physician: Dr. Berry  Primary Physician: Dr Klima Primary Cardiologist:  Dr Crenshaw  Reason for Consultation: Pulmonary HTN.   HPI:    [name] is a 66 y.o. female with h/o Bioprosthetic MVR 2012, h/o PAF on coumadin, recent GIB with coumadin stopped, IDDM, COPD, and tobacco abuse.   Pt admitted 04/18/16 with chest and left arm pain. Troponin elevated and NSTEMI called. Troponin peaked at 4.9 then trended downward. Other pertinent labs on admission include K 4.0, Creatinine 1.09, Hgb 9.9. CXR with mild interstitial edema along with chronic R sided rib fractures s/p plate and screw fixation.   Pt underwent LHC 04/21/16 with Left main - Ost LM Lesion, 30% stenosed and RCA - Ost RCA lesion, 40% stenosed. No target lesion found. HF team consulted with moderate to severe pulmonary Hypertension by Echo as below.   Feeling OK. Usually has no problems getting around the house.  Denies orthopnea, bendopnea, lightheadedness, dizziness, near syncope.  Pt current smoker of 1 ppd. Has smoked up to 2 ppd for > 50 years.  Stopped for 4 years after her MVR in 2012 but then restarted. Has been wearing CPAP for > 20 years. Has diagnosis of COPD as well. She had been off lasix for months at home. Was taking as needed. Prior to that was taking 80 mg daily per patient. Denies peripheral edema. Uses 02 at home with exertion and at night with CPAP.   Echo 04/21/16 LVEF 60-65%, RV severely dilated and systolic function mildly reduced.  PA peak pressure 76 mm Hg.   By comparison, PA peak pressure 33 mm Hg by Echo 08/2012 with normal RV  LHC 04/21/16 Left main - Ost LM Lesion, 30% stenosed  RCA - Ost RCA lesion, 40% stenosed.  AO 126/56 mm Hg LVEDP 20 mm Hg   Home Medications Prior to Admission medications   Medication Sig Start Date End Date Taking? Authorizing Provider  albuterol (PROAIR HFA) 108 (90 Base) MCG/ACT inhaler Inhale 1-2 puffs into the  lungs every 6 (six) hours as needed for shortness of breath. 11/14/15  Yes Lawrence Klima, MD  ALPRAZolam (XANAX) 1 MG tablet Take 0.5-1 tablets (0.5-1 mg total) by mouth 2 (two) times daily as needed for anxiety. Patient taking differently: Take 1 mg by mouth at bedtime.  02/13/16  Yes Lawrence Klima, MD  aspirin EC 325 MG tablet Take 1 tablet (325 mg total) by mouth daily. 03/29/16  Yes Sumayya Amin, MD  chlorpheniramine (CHLOR-TRIMETON) 4 MG tablet Take 1 tablet (4 mg total) by mouth 3 (three) times daily as needed for allergies. 10/06/14  Yes Lawrence Klima, MD  cyclobenzaprine (FLEXERIL) 5 MG tablet Take 1 tablet (5 mg total) by mouth 3 (three) times daily as needed for muscle spasms. 01/11/16  Yes Lawrence Klima, MD  ferrous gluconate (FERGON) 324 MG tablet Take 1 tablet (324 mg total) by mouth 2 (two) times daily with a meal. Patient taking differently: Take 324 mg by mouth daily.  11/16/15  Yes Lawrence Klima, MD  FLUoxetine (PROZAC) 20 MG tablet Take 2 tablets (40 mg total) by mouth daily. 12/14/15  Yes Lawrence Klima, MD  fluticasone (FLONASE) 50 MCG/ACT nasal spray Place 2 sprays into both nostrils daily. Patient taking differently: Place 2 sprays into both nostrils at bedtime.  02/08/15  Yes Lawrence Klima, MD  Fluticasone-Salmeterol (ADVAIR DISKUS) 500-50 MCG/DOSE AEPB Inhale 1 puff into the lungs 2 (two) times daily. 12/14/15  Yes Lawrence   Klima, MD  furosemide (LASIX) 80 MG tablet Take 1 tablet (80 mg total) by mouth 2 (two) times daily. Patient taking differently: Take 80 mg by mouth 2 (two) times daily as needed for fluid.  06/28/15  Yes Lawrence Klima, MD  gabapentin (NEURONTIN) 300 MG capsule Take 2 capsules (600 mg total) by mouth 3 (three) times daily. 11/16/15  Yes Lawrence Klima, MD  HYDROcodone-acetaminophen (NORCO/VICODIN) 5-325 MG tablet Take 1-2 tablets by mouth every 6 (six) hours as needed for severe pain. Patient taking differently: Take 2 tablets by mouth at bedtime.  11/16/15  Yes  Lawrence Klima, MD  insulin aspart (NOVOLOG) 100 UNIT/ML injection Inject 18-28 Units into the skin See admin instructions. Inject 18 units plus adjustment for CBG (sliding scale) - 3 times daily before meals   Yes Historical Provider, MD  Insulin Detemir (LEVEMIR FLEXTOUCH) 100 UNIT/ML Pen Inject 20 Units into the skin daily at 10 pm. Patient taking differently: Inject 45 Units into the skin daily before breakfast.  02/13/16  Yes Lawrence Klima, MD  lidocaine (XYLOCAINE) 2 % jelly APPLY TO AFFECTED AREA THREE TIMES A DAY Patient taking differently: APPLY TO AFFECTED AREA THREE TIMES A DAY AS NEEDED FOR PAIN 09/19/15  Yes Emily B Mullen, MD  nystatin cream (MYCOSTATIN) APPLY TO AFFECTED AREA TWICE DAILY Patient taking differently: APPLY TO AFFECTED AREA TWICE DAILY AS NEEDED FOR RASH 04/03/16  Yes Lawrence Klima, MD  omeprazole (PRILOSEC) 40 MG capsule Take 1 capsule (40 mg total) by mouth 2 (two) times daily. 08/22/15  Yes Lawrence Klima, MD  OXYGEN Inhale 2 L into the lungs as needed (for shortness of breath while active).   Yes Historical Provider, MD  potassium chloride SA (K-DUR,KLOR-CON) 20 MEQ tablet TAKE 1 TABLET BY MOUTH TWICE DAILY Patient taking differently: TAKE 1 TABLET BY MOUTH TWICE DAILY AS NEEDED - WITH EACH DOSE OF LASIX 09/19/15  Yes Emily B Mullen, MD  PRESCRIPTION MEDICATION Inhale into the lungs See admin instructions. CPAP - use whenever sleeping   Yes Historical Provider, MD  promethazine (PHENERGAN) 12.5 MG tablet Take 1 tablet (12.5 mg total) by mouth every 8 (eight) hours as needed for nausea or vomiting. 07/13/15  Yes Alex M Wilson, DO  rosuvastatin (CRESTOR) 20 MG tablet TAKE 1 TABLET BY MOUTH EVERY NIGHT AT BEDTIME 09/19/15  Yes Emily B Mullen, MD  SPIRIVA HANDIHALER 18 MCG inhalation capsule INHALE THE CONTENTS OF 1 CAPSULE VIA HANDIHALER BY MOUTH EVERY DAY 09/19/15  Yes Emily B Mullen, MD  Blood Glucose Monitoring Suppl (ONETOUCH VERIO) W/DEVICE KIT 1 each by Does not apply route 4  (four) times daily. 07/25/14   Lawrence Klima, MD  EASY TOUCH PEN NEEDLES 31G X 5 MM MISC USE TO INJECT INSULIN 5 TIMES DAILY 07/26/15   Lawrence Klima, MD  Insulin Disposable Pump (V-GO 40) KIT FILL AND PLACE VGO40 ABOUT THE SAME TIME EACH DAY. USE 4 TO 5 CLICKS WITH MEALS AND 2 CLICKS WITH SNACKS Patient not taking: Reported on 04/18/2016 03/04/16   Lawrence Klima, MD  Insulin Syringe-Needle U-100 31G X 15/64" 0.5 ML MISC Use to inject insulin up to 4 times a day 04/17/16   Nischal Narendra, MD  Lancets Misc. (ACCU-CHEK FASTCLIX LANCET) KIT Check your blood 4 times a day dx code 250.00 insulin requiring 01/19/13   Lawrence Klima, MD  NOVOLOG 100 UNIT/ML injection USE TO FILL VGO40 TO WEAR DAILY. Patient not taking: Reported on 04/18/2016 03/04/16   Lawrence Klima, MD  ONETOUCH VERIO test   strip Check blood sugar up to 4 times a day 02/28/15   Lawrence Klima, MD    Past Medical History: Past Medical History:  Diagnosis Date  . Adenomatous polyps 05/14/2011   Colonoscopy (05/2011): 4 mm adenomatous polyp excised endoscopically Colonoscopy (02/2002): Adenomatous polyp excised endoscopically   . Allergic rhinitis 06/01/2012  . Anemia of chronic disease 01/01/2013  . Anxiety 07/24/2010  . Aortic atherosclerosis (HCC) 10/19/2014   Seen on CT scan, currently asymptomatic  . 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 Asymptomatic cholelithiasis 09/25/2015   Seen on CT scan 08/2015  . Bilateral cataracts 08/04/2012   Visually insignificant   . Carotid artery stenosis    s/p right endarterectomy (06/2010) Carotid US (07/2010):  Left: Moderate-to-severe (60-79%) calcific and non-calcific plaque origin and proximal ICA and ECA   . Chronic congestive heart failure with left ventricular diastolic dysfunction (HCC) 10/21/2010  . Chronic constipation 02/03/2011  . Chronic daily headache 01/16/2014  . Chronic low back pain 10/06/2012  .  Chronic venous insufficiency 08/04/2012  . Clear cell renal cell carcinoma (HCC) 07/21/2011   s/p cryoablation of left RCC in 09/2011 by Dr. Yamagata. Followed by Dr. Dahlstedt  ([facility]) .    . COPD (chronic obstructive pulmonary disease) with emphysema (HCC)    PFTs 02/2012: FEV1 0.92 (40%), ratio 69, 27% increase in FEV1 with BD, TLC 91%, severe airtrapping, DLCO49% On chronic home O2. Pulmonary rehab referral 05/2012   . Depression 11/19/2005  . Esophageal stenosis 08/08/2015   Mild, benign-appearing on EGD 06/29/2015  . Fibromyalgia 08/29/2010  . Gastroesophageal reflux disease   . History of blood transfusion    "several times"   . Hyperlipidemia LDL goal < 100 11/20/2005  . Internal hemorrhoids 08/04/2012  . Mitral stenosis    s/p Mitral valve replacement with a 27-mm pericardial porcine valve (Medtronic Mosaic valve, serial #[id number] on 09/20/10, Dr. Van Trigt)   . Moderate to severe pulmonary hypertension    2014 TEE w PA peak pressure 46 mmHg, s/p MV replacement   . Obesity (BMI 30.0-34.9) 10/23/2011  . Obstructive sleep apnea    Nocturnal polysomnography (06/2009): Moderate sleep apnea/ hypopnea syndrome , AHI 17.8 per hour with nonpositional hypopneas. CPAP titration to 12 CWP, AHI 2.4 per hour. On nocturnal CPAP via a small resMed Quattro full-face mask with heated humidifier.   . Osteoporosis    DEXA (12/09/2011): L-spine T -3.7, left hip T -1.4 DEXA (12/2004): L-spine T -2.6, left hip -0.1   . Paroxysmal atrial fibrillation (HCC) 10/22/2010   s/p Left atrial maze procedure for paroxysmal atrial fibrillation on 09/20/2010 by Dr Van Trigt.  Subsequent splenic infarct, decision was made to re-anticoagulate with coumadin, likely life-long as this is the most likely cause of the splenic infarct.   . Right nephrolithiasis 09/06/2014   5 mm non-obstructing calculus seen on CT scan 09/05/2014   . Seborrheic keratosis 09/28/2015  . Shortness of breath dyspnea   . Tobacco abuse  07/28/2012  . Type 2 diabetes mellitus with diabetic neuropathy (HCC)     Past Surgical History: Past Surgical History:  Procedure Laterality Date  . CARDIAC VALVE REPLACEMENT  Aug. 2012   "mitral valve"  . CAROTID ENDARTERECTOMY Right 07/04/2010   by Dr. [name] for asymptomatic right carotid artery stenosis  . CHEST TUBE INSERTION  09/24/2010   Dr Van Trigt  . COLONOSCOPY  05/12/2011   performed by Dr. Schooler. Showing small internal   hemorrhoids, single tubular adenoma polyp  . CRYOABLATION Left 09/2011   by Dr. Yamagata. Followed by Dr. Dahlstedt  ([facility]) .    . DILATION AND CURETTAGE OF UTERUS    . ESOPHAGOGASTRODUODENOSCOPY  05/12/2011   performed by Dr. Schooler. Negative for ulcerations, biopsy negative for evidence of celiac sprue  . ESOPHAGOGASTRODUODENOSCOPY N/A 06/29/2015   Procedure: ESOPHAGOGASTRODUODENOSCOPY (EGD);  Surgeon: Marc Magod, MD;  Location: [facility];  Service: Endoscopy;  Laterality: N/A;  . ESOPHAGOGASTRODUODENOSCOPY N/A 03/29/2016   Procedure: ESOPHAGOGASTRODUODENOSCOPY (EGD);  Surgeon: Marc Magod, MD;  Location: [facility];  Service: Endoscopy;  Laterality: N/A;  . FRACTURE SURGERY Right 2005   clavicle  . GIVENS CAPSULE STUDY N/A 06/30/2015   Procedure: GIVENS CAPSULE STUDY;  Surgeon: Marc Magod, MD;  Location: [facility];  Service: Endoscopy;  Laterality: N/A;  . GIVENS CAPSULE STUDY N/A 06/29/2015   Procedure: GIVENS CAPSULE STUDY;  Surgeon: Marc Magod, MD;  Location: [facility];  Service: Endoscopy;  Laterality: N/A;  . Hysteroscopy with endometrial ablation  06/2001   for persistent post-menopausal bleeding // by S. Dean McPhail, M.D.  . IR GENERIC HISTORICAL  08/23/2015   IR RADIOLOGIST EVAL & MGMT 08/23/2015 Glenn Yamagata, MD [facility] INTER[facility] RAD  . IR GENERIC HISTORICAL  04/09/2016   IR RADIOLOGIST EVAL & MGMT 04/09/2016 Glenn Yamagata, MD [facility] INTER[facility]  . LEFT HEART CATH AND CORONARY ANGIOGRAPHY N/A 04/21/2016   Procedure: Left Heart  Cath and Coronary Angiography;  Surgeon: Jonathan J Berry, MD;  Location: [facility] INVASIVE CV [facility];  Service: Cardiovascular;  Laterality: N/A;  . LIPOMA EXCISION  08/2005   occipital lipoma 1.5cm - by Dr. Leone  . MAZE Left 09/20/10   for paroxysmal atrial fibrillation (Dr. Van Trigt)  . MITRAL VALVE REPLACEMENT  09/20/10    with a 27-mm pericardial porcine valve (Medtronic Mosaic valve, serial #[id number]). 09/20/10, Dr Van Trigt  . ORIF CLAVICLE FRACTURE  01/2004   by Mark C. Yates, M.D for Right clavicle nonunion.  . TONSILLECTOMY    . TUBAL LIGATION      Family History: Family History  Problem Relation Age of Onset  . Peptic Ulcer Disease Father   . Heart attack Father 56    Died of MI at age 56  . Heart attack Brother 54    Died of MI at age 54  . Obesity Brother   . Pneumonia Mother   . Healthy Sister   . Lupus Daughter   . Obsessive Compulsive Disorder Daughter     Social History: Social History   Social History  . Marital status: Divorced    Spouse name: N/A  . Number of children: N/A  . Years of education: N/A   Social History Main Topics  . Smoking status: Current Every Day Smoker    Packs/day: 1.00    Years: 53.00    Types: Cigarettes    Start date: 11/01/1962  . Smokeless tobacco: Never Used     Comment: 1/2 to 1 pack per day  . Alcohol use No  . Drug use: No  . Sexual activity: No   Other Topics Concern  . None   Social History Narrative   Lives alone in [place] ([address] with 60 pound chow   Worked at [employer] for 18 years   No car    Allergies:  Allergies  Allergen Reactions  . Morphine And Related Other (See Comments)    Injection site reaction  . Oxycontin [Oxycodone] Other (See Comments)      headache  . Tramadol Hcl Swelling    Ankle swelling  . Lorazepam Other (See Comments)    Patient's sister noted that ativan caused the patient to become extremely confused during hospitalization 09/2010; tolerates Xanax     Objective:    Vital Signs:   Temp:  [97.7 F (36.5 C)-98.2 F (36.8 C)] 97.7 F (36.5 C) (03/13 0526) Pulse Rate:  [0-99] 49 (03/13 0526) Resp:  [0-78] 16 (03/13 0526) BP: (111-156)/(32-64) 129/41 (03/13 0526) SpO2:  [0 %-100 %] 96 % (03/13 1034) Weight:  [193 lb 4.8 oz (87.7 kg)] 193 lb 4.8 oz (87.7 kg) (03/13 0526) Last BM Date: 04/18/16  Weight change: Filed Weights   04/21/16 0410 04/21/16 0600 04/22/16 0526  Weight: 185 lb 4.8 oz (84.1 kg) 193 lb 6.4 oz (87.7 kg) 193 lb 4.8 oz (87.7 kg)    Intake/Output:   Intake/Output Summary (Last 24 hours) at 04/22/16 1146 Last data filed at 04/22/16 1004  Gross per 24 hour  Intake          1313.83 ml  Output              600 ml  Net           713.83 ml     Physical Exam: General:  Obese. Short. NAD HEENT: Normal. Neck: supple. JVP difficult, but appears 11-12 cm. Carotids 2+ bilat; no bruits. No lymphadenopathy or thyromegaly appreciated. Cor: PMI nondisplaced. RRR. Brady at times. No rubs, gallops or murmurs. Lungs: Diminished throughout.  Abdomen: Obese, soft, NT, ND, no HSM. No bruits or masses. +BS.  Extremities: no cyanosis, clubbing, rash, 1+ edema 1/3 way to knee.  Neuro: alert & orientedx3, cranial nerves grossly intact. moves all 4 extremities w/o difficulty. Affect pleasant  Telemetry: Reviewed, Sinus brady 50-60s.   Labs: Basic Metabolic Panel:  Recent Labs Lab 04/18/16 2208 04/19/16 0428 04/21/16 0527 04/22/16 0427  NA 140 137 135 138  K 3.6 4.0 4.2 4.5  CL 101 101 103 106  CO2 28 24 24 25  GLUCOSE 81 202* 213* 153*  BUN 13 14 20 18  CREATININE 1.09* 1.01* 1.13* 0.95  CALCIUM 9.6 9.1 8.8* 8.9    Liver Function Tests: No results for input(s): AST, ALT, ALKPHOS, BILITOT, PROT, ALBUMIN in the last 168 hours. No results for input(s): LIPASE, AMYLASE in the last 168 hours. No results for input(s): AMMONIA in the last 168 hours.  CBC:  Recent Labs Lab 04/18/16 2208 04/19/16 0428  04/20/16 0510 04/21/16 0527 04/22/16 0427  WBC 9.4 9.8 8.1 6.6 7.0  HGB 9.9* 9.7* 9.3* 8.8* 8.7*  HCT 32.9* 32.9* 31.4* 28.5* 29.2*  MCV 94.0 93.2 93.5 92.8 93.6  PLT 215 240 242 168 169    Cardiac Enzymes:  Recent Labs Lab 04/19/16 0428 04/19/16 1039 04/20/16 0912  TROPONINI 2.90* 4.98* 2.35*    BNP: BNP (last 3 results)  Recent Labs  06/27/15 1139  BNP 383.9*    ProBNP (last 3 results) No results for input(s): PROBNP in the last 8760 hours.   CBG:  Recent Labs Lab 04/21/16 1122 04/21/16 1628 04/21/16 2144 04/22/16 0746 04/22/16 1115  GLUCAP 152* 186* 263* 173* 190*    Coagulation Studies:  Recent Labs  04/21/16 0527  LABPROT 14.0  INR 1.07    Other results: EKG: 04/20/16 Sinus brady with 1st degree AV block. 56 bpm.   Imaging:  No results found.   Medications:     Current Medications: . aspirin  81 mg Oral   Daily  . enoxaparin (LOVENOX) injection  40 mg Subcutaneous Daily  . ferrous gluconate  324 mg Oral BID WC  . FLUoxetine  40 mg Oral Daily  . fluticasone  2 spray Each Nare QHS  . furosemide  40 mg Oral BID  . gabapentin  600 mg Oral TID  . insulin aspart  0-15 Units Subcutaneous TID WC  . insulin detemir  30 Units Subcutaneous QHS  . metoprolol tartrate  25 mg Oral BID  . mometasone-formoterol  2 puff Inhalation BID  . nicotine  21 mg Transdermal Daily  . rosuvastatin  20 mg Oral QHS  . sodium chloride flush  3 mL Intravenous Q12H  . tiotropium  18 mcg Inhalation Daily     Infusions:    Assessment/Plan   [name] is a 66 y.o. female with bioprosthetic MVR, PAF, DM2, and Tobacco abuse. Admitted for chest pain and NSTEMI. No "culprit lesion" on LHC. AHF team consulted to see with PA peak pressure 76 mm Hg on Echo.   1. Pulmonary HTN - PA peak pressure of 76 mm HG on Echo. No RHC or PFTs in system.  Likely at least Moderate to Severe PAH. - Suspect combination of WHO group II and III with long standing mitral  valve disease and COPD.  Will need PFTs and RHC to further quantify. If patient adamant to go home can consider as outpatient.  - RV dilated and hypokinetic.  - Started on lasix 40 mg BID ( was taking only prn at home previously).   May need more.  JVP elevated but ? related to her RV dysfunction.  2. NSTEMI - Troponins peaked at 4.9. No culprit lesion on CAD. Thought to be demand ischemia secondary to anemia. - With troponin elevation and no CAD, need to consider PE. Needs VQ Scan.  3. Paroxysmal Afib s/p MAZE 2012 - Currently in sinus bradycardia by tele and EKG.  - Coumadin stopped 03/2016 and put on ASA.  Tolerated IV heparin earlier this admission. - This patients CHA2DS2-VASc Score is at least 5.  - Will eventually need to resume coumadin vs NOAC 4. H/o GI bleed - EGD 03/28/16 normal - Hgb with slight down trend during admission. No overt bleeding. Continue to follow closely.  5. Tobacco abuse - Needs to stop completely. > 50 pack years.  - Encourage cessation.  6. COPD - With long history of smoking.  No PFTs in system. Likely contributor to her Pulmonary HTN.  - Continue home meds. - She is supposed to use 02 with exertion and with her CPAP.  7. Bradycardia - Stable in 50-60s by tele.  Would follow closely.  - No indication for pacemaker at this time.  8. OSA - Continue CPAP.     If goes home today will need to be seen in HF clinic in next few weeks. Please hold until seen by HF MD.   Length of Stay: 3  Michael Andrew Tillery, PA-C  04/22/2016, 11:46 AM  Advanced Heart Failure Team Pager [phone] (M-F; 7a - 4p)  Please contact [facility] for night-coverage after hours (4p -7a ) and weekends on [email or web address]  Patient seen with PA, agree with the above note.   1. Pulmonary hypertension with RV failure: Severe PH by echo with RV dysfunction (EF 60-65%, mild LVH, normally functioning bioprosthetic aortic valve, severely dilated RV mildly decreased systolic function, PASP 76  mmHg).  She has multiple reasons for pulmonary hypertension: OSA, COPD, prior mitral valve disease with   possible pulmonary vascular remodeling and PH despite valve replacement, and diastolic CHF (group 2 PH).  Exam is difficult, but I think that she is volume overloaded (primarily RV failure most likely).  I think she needs diuresis.  She also needs to use CPAP nightly (is doing so) and quit smoking.  I am unsure if she would be a candidate for selective pulmonary vasodilators.  If PH is out of proportion to COPD and is not primarily due to left heart failure (group 2), then selective pulmonary vasodilators would be a consideration.  - She needs RHC to define LV and RV filling pressures and PA pressure.  If primarily pulmonary venous hypertension, she may just need diuresis.  We discussed risks/benefits of RHC and she agrees to proceed (will do tomorrow).  - Needs PFTs to define severity of COPD.  - I will get V/Q scan to rule out chronic PEs.  - Given evidence for volume overload, start Lasix 40 mg IV bid. Will get a better idea of how much diuresis is needed from RHC tomorrow.  2. Chest pain and elevated troponin: ?Demand ischemia from volume overload/RV failure.  Coronary angiography showed nonobstructive CAD.  She is on ASA 81 and statin.  3. S/p MV replacement: Bioprosthetic mitral valve.  The valve looked ok on echo this admission.  She had mitral stenosis as the lesion triggering the valve repair.  As above, it is possible that she developed pulmonary vascular remodeling in the setting of long-standing mitral valve disease that did not correct with valve replacement.  This could lead to a group 1-like form of pulmonary hypertension.  4. COPD: No recent PFTs.  She has home oxygen but rarely uses it.  She continues to smoke.  - I strongly encouraged smoking cessation.  - She will get PFTs.  5. OSA: Continue CPAP.  6. Anemia: Fe deficiency and chronic disease.  Chronic slow GI bleed suspected from  small bowel AVMs.  After last bleeding episode in the hospital, warfarin was stopped. With bioprosthetic mitral valve for mitral stenosis, warfarin is the only anticoagulant indicated. Will need to reassess with patient and Dr. Klima (has followed her for a long time) whether we should restart coumadin, maybe using a lower INR goal such as 1.5-2.5.  7. Atrial fibrillation: Paroxysmal, s/p MAZE with mitral valve replacement.  Currently NSR.  She is off warfarin due to GI bleeding.   Christl Fessenden 04/22/2016 2:40 PM   

## 2016-04-22 NOTE — Progress Notes (Signed)
Stopped [place] PA and informed that pt received PO lasix at 1200, PA stated okay to give IV lasix at [place]

## 2016-04-22 NOTE — Progress Notes (Signed)
Progress Note  Patient Name: [name] Date of Encounter: 04/22/2016  Primary Cardiologist: Dr. Stanford Breed  Subjective   No chest pain or dyspnea.   Inpatient Medications    Scheduled Meds: . aspirin  81 mg Oral Daily  . enoxaparin (LOVENOX) injection  40 mg Subcutaneous Daily  . ferrous gluconate  324 mg Oral BID WC  . FLUoxetine  40 mg Oral Daily  . fluticasone  2 spray Each Nare QHS  . gabapentin  600 mg Oral TID  . insulin aspart  0-15 Units Subcutaneous TID WC  . insulin detemir  30 Units Subcutaneous QHS  . metoprolol tartrate  25 mg Oral BID  . mometasone-formoterol  2 puff Inhalation BID  . nicotine  21 mg Transdermal Daily  . rosuvastatin  20 mg Oral QHS  . sodium chloride flush  3 mL Intravenous Q12H  . tiotropium  18 mcg Inhalation Daily   Continuous Infusions:  PRN Meds: sodium chloride, acetaminophen, albuterol, ALPRAZolam, cyclobenzaprine, diclofenac sodium, famotidine, gi cocktail, HYDROcodone-acetaminophen, lidocaine, ondansetron (ZOFRAN) IV, promethazine, sodium chloride flush   Vital Signs    Vitals:   04/21/16 1729 04/21/16 1944 04/21/16 2022 04/22/16 0526  BP: (!) 134/56 (!) 135/52  (!) 129/41  Pulse: (!) 55 (!) 56 (!) 56 (!) 49  Resp:   18 16  Temp:  97.9 F (36.6 C)  97.7 F (36.5 C)  TempSrc:  Oral  Oral  SpO2: 93% 94% 93% 96%  Weight:    193 lb 4.8 oz (87.7 kg)  Height:        Intake/Output Summary (Last 24 hours) at 04/22/16 0911 Last data filed at 04/22/16 0814  Gross per 24 hour  Intake          1313.83 ml  Output                0 ml  Net          1313.83 ml   Filed Weights   04/21/16 0410 04/21/16 0600 04/22/16 0526  Weight: 185 lb 4.8 oz (84.1 kg) 193 lb 6.4 oz (87.7 kg) 193 lb 4.8 oz (87.7 kg)    Telemetry    NSR at rate of 50s - Personally Reviewed  ECG    NSR with 1st degree AV block - Personally Reviewed  Physical Exam   GEN: No acute distress.   Neck: No JVD Cardiac: RRR, no murmurs, rubs, or gallops.   R radial cath site without hematoma or bruit.  Respiratory: CTA  GI: Soft, nontender, non-distended  Kathryn: No edema; No deformity. Neuro:  Nonfocal  Psych: Normal affect   Labs    Chemistry  Recent Labs Lab 04/19/16 0428 04/21/16 0527 04/22/16 0427  NA 137 135 138  K 4.0 4.2 4.5  CL 101 103 106  CO2 _0 GLUCOSE 202* 213* 153*  BUN _1 CREATININE 1.01* 1.13* 0.95  CALCIUM 9.1 8.8* 8.9  GFRNONAA 57* 50* >60  GFRAA >60 57* >60  ANIONGAP _2 Hematology  Recent Labs Lab 04/20/16 0510 04/21/16 0527 04/22/16 0427  WBC 8.1 6.6 7.0  RBC 3.36* 3.07* 3.12*  HGB 9.3* 8.8* 8.7*  HCT 31.4* 28.5* 29.2*  MCV 93.5 92.8 93.6  MCH 27.7 28.7 27.9  MCHC 29.6* 30.9 29.8*  RDW 21.0* 21.1* 21.1*  PLT 242 168 169    Cardiac Enzymes  Recent Labs Lab 04/19/16 0428 04/19/16 1039 04/20/16 0912  TROPONINI 2.90* 4.98*  2.35*     Recent Labs Lab 04/18/16 2215 04/19/16 0043 04/19/16 0345  TROPIPOC 0.07 0.77* 2.26*     BNPNo results for input(s): BNP, PROBNP in the last 168 hours.   DDimer   Recent Labs Lab 04/19/16 0050  DDIMER 0.51*     Radiology    No results found.  Cardiac Studies   Echo 04/21/16 Study Conclusions  - Left ventricle: Wall thickness was increased in a pattern of mild   LVH. Systolic function was normal. The estimated ejection   fraction was in the range of 60% to 65%. - Aortic valve: Mildly thickened, mildly calcified leaflets. - Mitral valve: Bioprosthetic aortic valve functioning normally.   Valve area by pressure half-time: 2.14 cm^2. - Left atrium: The atrium was mildly dilated. Volume/bsa, ES,   (1-plane Simpson&'s, A2C): 39.6 ml/m^2. - Right ventricle: The cavity size was severely dilated. Wall   thickness was normal. Systolic function was mildly reduced. - Tricuspid valve: There was moderate regurgitation. - Pulmonary arteries: Systolic pressure was severely increased. PA   peak pressure: 76 mm Hg  (S).  Impressions:  - When compared to prior echo, RV is now dilated and hypokinetic.   Pulm HTN is now present.    Left Heart Cath and Coronary Angiography   04/21/16  Conclusion     Ost RCA lesion, 40 %stenosed.  Ost LM lesion, 30 %stenosed.   IMPRESSION:[name] has minimal CAD and normal LV function by 2-D echocardiogram. There was no "culprit lesion. I suspect this was a non-STEMI secondary to stress-induced septal subendocardial ischemia potentially from anemia. The sheath was removed and a TR band was placed on the right wrist which is patent hemostasis. The patient left the lab in stable condition. She will most likely be  discharged  in the morning.     Patient Profile     66 y/o obese female with h/o IDDM, ongoing tobacco abuse, mitral valve replacement in 2012, h/o PAF, previously on coumadin, recent GIB 03/2016 and coumadin stopped. Admitted for chest and left arm pain. Found to have an elevated troponin w/c NSTEMI.   Assessment & Plan    1. NSTEMI: Symptomatically stable. Coronaries normal in 2012. Troponins peaked at 4.9, now trending down. Hgb is stable. Recent h/o GI bleed. On IV heparin. Cath showed minimal CAD "no culprit lesion". Likely demand insetting of anemia. Continue ASA, metoprolol, and Crestor.  2. Bioprosthetic MVR: Normal functioning per echo.   3. Paroxysmal atrial fibrillation: Warfarin stopped 03/2016 due to GI bleed and was put on ASA. Currently on IV heparin. Hgb stable. Rate in 38s. Resume long term anticoagulation when ok with primary.   4. H/o GI bleed: Hgb stable. On ASA and IV heparin at present. Denies blood in her stool. As above.   5. HLD - 04/19/2016: Cholesterol 103; HDL 28; LDL Cholesterol 29; Triglycerides 232; VLDL 46  - Continue Crestor  6. Tobacco abuse - On nicotine patch. Advised smoking cessation. Education given.   7. Severe pulmonary HTN - PA peak pressure of 55m HG. RV is now dilated and hypokinetic. ? CHF  referral.    Agree with note by VRobbie LisPA-C  [name] Is status post left heart cath via right radial approach revealing noncritical CAD. She has normal LV systolic function with a normally functioning mitral bioprosthetic valve by 2-D echo. She does have severe pulmonary hypertension which is a new finding as well as RV dilatation and hypocontractility. There were no lesions identified responsible  for her "non-STEMI". She denies chest pain. I agree with smoking cessation and CHF service consult to further address the pulmonary hypertension. Her exam is benign. She'll be followed up as an outpatient by Dr. Stanford Breed. No further recommendations at this time.  Lorretta Harp, M.D., [place], [facility], Laverta [place] [address] [place], [place]  [address]  [phone] 04/22/2016 9:44 AM

## 2016-04-22 NOTE — Progress Notes (Signed)
Inpatient Diabetes Program Recommendations  AACE/ADA: New Consensus Statement on Inpatient Glycemic Control (2015)  Target Ranges:  Prepandial:   less than 140 mg/dL      Peak postprandial:   less than 180 mg/dL (1-2 hours)      Critically ill patients:  140 - 180 mg/dL   Lab Results  Component Value Date   GLUCAP 173 (H) 04/22/2016   HGBA1C 7.6 12/14/2015    Review of Glycemic Control Results for [name], [name] (MRN [id number]) as of 04/22/2016 10:36  Ref. Range 04/21/2016 07:42 04/21/2016 11:22 04/21/2016 16:28 04/21/2016 21:44 04/22/2016 07:46  Glucose-Capillary Latest Ref Range: 65 - 99 mg/dL 194 (H) 152 (H) 186 (H) 263 (H) 173 (H)   Home DM Meds: Levemir 45 units daily                             Novolog 18 units TID with meals                             Novolog SSI   Current Insulin Orders: Levemir 30 units QHS                                       Novolog Moderate Correction Scale/ SSI (0-15 units) TID [facility]  Inpatient Diabetes Program Recommendations:    Please consider: -Add Novolog correction @ hs 0-5 units  Thank you, Bethena Roys E. Hanks, RN, MSN, CDE Inpatient Glycemic Control Team Team Pager [phone] (8am-5pm) 04/22/2016 10:49 AM

## 2016-04-22 NOTE — Progress Notes (Signed)
NM called stated pt needed a 2 view chest xray to complete NM Pulmonary Perf and Vent. Order placed  [name]

## 2016-04-22 NOTE — Telephone Encounter (Signed)
Needs TOC Discharge date 04/22/16 HFU 04/29/16

## 2016-04-22 NOTE — Progress Notes (Signed)
   Subjective:  Asymptomatic this morning. No complaints. Discussed her cath findings and the plan moving forward. Planned for DC home this afternoon after seen by heart failure team.  Objective:  Vital signs in last 24 hours: Vitals:   04/21/16 1729 04/21/16 1944 04/21/16 2022 04/22/16 0526  BP: (!) 134/56 (!) 135/52  (!) 129/41  Pulse: (!) 55 (!) 56 (!) 56 (!) 49  Resp:   18 16  Temp:  97.9 F (36.6 C)  97.7 F (36.5 C)  TempSrc:  Oral  Oral  SpO2: 93% 94% 93% 96%  Weight:    193 lb 4.8 oz (87.7 kg)  Height:       General: Elderly woman sitting in bedside chair, anxious and conversational HEENT: [place]/AT, moist mucus membranes CV: Slow rate and regular rhythm, no m/g/r Pulm/Chest: CTA bil, normal work of breathing Ext: No peripheral edema. Warm and well-perfused Neuro: Alert and oriented x 3  TTE on 3/12 - LVEF 60-65%, RV dilated and hypokinetic with severely increased pulmonary artery pressure (estimated 76 mmHg)  LHC on 3/12 - Ost RCA lesion, 40 %stenosed. Ost LM lesion, 30 %stenosed. Minimal CAD   Assessment/Plan:  NSTEMI: Troponins peaked at 4.98 on 3/10 and trended back down. On heparin gtt over the weekend and has been without chest pain. Underwent LHC today 3/12 which showed minimal CAD and no culprit lesion. Likely has ischemic event of small coronary vessel not seen on cath. - Appreciate Cardiology recs - Continue ASA, Crestor  Pulmonary hypertension, and right-sided heart failure: Echo in 2014 with normal EF, mild LVH, and severely dilated left atrium. Not clinically volume overloaded this hospital stay. TTE on 3/12 showed LVEF 60-65%, RV dilated and hypokinetic with severely increased pulmonary artery pressure (estimated 76 mmHg). PAH likely multifactorial from COPD, OSA, and some left sided HF as LVEDP was elevated to 20 mm Hg. -- Prescribed Lasix 80 mg BID at home but was taking PRN or not at all -- Resume Lasix 40 mg BID -- Seeing HF team today, will need to  follow up with them in two weeks -- Will refer for pulmonary rehab on DC  Iron deficiency anemia: Recent hospitalization for GI bleed secondary to AVM. Hb 8.7 today from 9.8 yesterday. - Follow CBC - Continue home iron   GERD, resolved today, was having persistent reflux symptoms and chest wall pain that did not seem to respond to Pepcid or Protonix -- GI cocktail TID PRN -- Voltaren gel QID PRN -- Pepcid 20 mg BID PRN -- Zofran and Phenergan PRN for nausea  PAF and s/p mitral valve repair, bioprosthetic MVR: Coumadin discontinued after GI bleed 03/2016. Has porcine valve. CHADSVASC score 6. Valve functioning normally on TTE 3/12. -- Metoprolol 25 mg BID -- Continue home ASA   Type 2 DM: CBGs uncontrolled yesterday in high 200s, increase levemir from 20 to 30 QHS, was NPO much of today, now on liquid diet. Last A1c 7.6 in 12/2015 - Continue Levemir 30 units QHS - SSI-M  COPD/OSA: Stable - Continue home Advair and Spiriva - CPAP QHS  Depression/Anxiety: - Continue home Fluoxetine and Alprazolam   Chronic Pain/Fibromyalgia:  - Continue home Norco, Flexeril  Tobacco Abuse: Smokes 1 pack per day. - Nicotine patch  Diet: Heart healthy  DVT PPx: Heparin gtt  Dispo: Anticipated discharge this evening with [facility], HF, cards follow up   Asencion Partridge, MD 04/22/2016, 7:31 AM Pager: [phone]

## 2016-04-22 NOTE — Progress Notes (Signed)
Patient was counseled on her new medication, metoprolol succinate, and the new dose of her furosemide, 25m daily.  Patient was made aware of the indications for these medications, the proper way to take these medications, and common side effects she may experience.  Patient expressed understanding.  Dalton Hudgins and SQuincy SheehanPharmD Candidates, 2018 [place]

## 2016-04-23 ENCOUNTER — Inpatient Hospital Stay (HOSPITAL_COMMUNITY): Payer: PPO

## 2016-04-23 ENCOUNTER — Encounter (HOSPITAL_COMMUNITY): Payer: Self-pay | Admitting: Cardiology

## 2016-04-23 ENCOUNTER — Encounter (HOSPITAL_COMMUNITY)
Admission: EM | Disposition: A | Discharge status: Skilled Nursing Facility | Payer: Self-pay | Source: Home / Self Care | Attending: Student in an Organized Health Care Education/Training Program

## 2016-04-23 DIAGNOSIS — I272 Pulmonary hypertension, unspecified: Secondary | ICD-10-CM

## 2016-04-23 DIAGNOSIS — I5033 Acute on chronic diastolic (congestive) heart failure: Secondary | ICD-10-CM

## 2016-04-23 DIAGNOSIS — I509 Heart failure, unspecified: Secondary | ICD-10-CM

## 2016-04-23 HISTORY — PX: RIGHT HEART CATH: CATH118263

## 2016-04-23 LAB — PULMONARY FUNCTION TEST
DL/VA % pred: 76 %
DL/VA: 3.48 ml/min/mmHg/L
DLCO cor % pred: 52 %
DLCO cor: 11.37 ml/min/mmHg
DLCO unc % pred: 44 %
DLCO unc: 9.68 ml/min/mmHg
FEF 25-75 Post: 0.92 L/sec
FEF 25-75 Pre: 0.76 L/sec
FEF2575-%Change-Post: 22 %
FEF2575-%Pred-Post: 47 %
FEF2575-%Pred-Pre: 39 %
FEV1-%Change-Post: 6 %
FEV1-%Pred-Post: 48 %
FEV1-%Pred-Pre: 45 %
FEV1-Post: 1.05 L
FEV1-Pre: 0.99 L
FEV1FVC-%Change-Post: -6 %
FEV1FVC-%Pred-Pre: 96 %
FEV6-%Change-Post: 14 %
FEV6-%Pred-Post: 55 %
FEV6-%Pred-Pre: 48 %
FEV6-Post: 1.52 L
FEV6-Pre: 1.32 L
FEV6FVC-%Pred-Post: 105 %
FEV6FVC-%Pred-Pre: 105 %
FVC-%Change-Post: 14 %
FVC-%Pred-Post: 53 %
FVC-%Pred-Pre: 46 %
FVC-Post: 1.52 L
FVC-Pre: 1.32 L
Post FEV1/FVC ratio: 69 %
Post FEV6/FVC ratio: 100 %
Pre FEV1/FVC ratio: 75 %
Pre FEV6/FVC Ratio: 100 %
RV % pred: 80 %
RV: 1.63 L
TLC % pred: 76 %
TLC: 3.63 L

## 2016-04-23 LAB — CBC
HCT: 30.8 % — ABNORMAL LOW (ref 36.0–46.0)
Hemoglobin: 9.4 g/dL — ABNORMAL LOW (ref 12.0–15.0)
MCH: 28.1 pg (ref 26.0–34.0)
MCHC: 30.5 g/dL (ref 30.0–36.0)
MCV: 91.9 fL (ref 78.0–100.0)
Platelets: 166 10*3/uL (ref 150–400)
RBC: 3.35 MIL/uL — ABNORMAL LOW (ref 3.87–5.11)
RDW: 20.4 % — ABNORMAL HIGH (ref 11.5–15.5)
WBC: 6.8 10*3/uL (ref 4.0–10.5)

## 2016-04-23 LAB — POCT I-STAT 3, VENOUS BLOOD GAS (G3P V)
Acid-Base Excess: 3 mmol/L — ABNORMAL HIGH (ref 0.0–2.0)
Bicarbonate: 28.2 mmol/L — ABNORMAL HIGH (ref 20.0–28.0)
O2 Saturation: 53 %
TCO2: 30 mmol/L (ref 0–100)
pCO2, Ven: 47 mmHg (ref 44.0–60.0)
pH, Ven: 7.386 (ref 7.250–7.430)
pO2, Ven: 29 mmHg — CL (ref 32.0–45.0)

## 2016-04-23 LAB — GLUCOSE, CAPILLARY
Glucose-Capillary: 171 mg/dL — ABNORMAL HIGH (ref 65–99)
Glucose-Capillary: 170 mg/dL — ABNORMAL HIGH (ref 65–99)
Glucose-Capillary: 236 mg/dL — ABNORMAL HIGH (ref 65–99)
Glucose-Capillary: 319 mg/dL — ABNORMAL HIGH (ref 65–99)

## 2016-04-23 LAB — BASIC METABOLIC PANEL
Anion gap: 8 (ref 5–15)
BUN: 21 mg/dL — ABNORMAL HIGH (ref 6–20)
CO2: 27 mmol/L (ref 22–32)
Calcium: 9.3 mg/dL (ref 8.9–10.3)
Chloride: 100 mmol/L — ABNORMAL LOW (ref 101–111)
Creatinine, Ser: 1.04 mg/dL — ABNORMAL HIGH (ref 0.44–1.00)
GFR calc Af Amer: >60 mL/min (ref >60)
GFR calc non Af Amer: 54 mL/min — ABNORMAL LOW (ref >60)
Glucose, Bld: 230 mg/dL — ABNORMAL HIGH (ref 65–99)
Potassium: 3.8 mmol/L (ref 3.5–5.1)
Sodium: 135 mmol/L (ref 135–145)

## 2016-04-23 SURGERY — RIGHT HEART CATH
Anesthesia: LOCAL

## 2016-04-23 MED ORDER — HEPARIN SODIUM (PORCINE) 5000 UNIT/ML IJ SOLN
5000.0000 [IU] | Freq: Three times a day (TID) | INTRAMUSCULAR | Status: DC
  Administered 2016-04-23 – 2016-04-25 (×5): 5000 [IU] via SUBCUTANEOUS
  Filled 2016-04-23 (×6): qty 1

## 2016-04-23 MED ORDER — SODIUM CHLORIDE 0.9% FLUSH
3.0000 mL | Freq: Two times a day (BID) | INTRAVENOUS | Status: DC
  Administered 2016-04-23 – 2016-04-25 (×5): 3 mL via INTRAVENOUS

## 2016-04-23 MED ORDER — INSULIN ASPART 100 UNIT/ML ~~LOC~~ SOLN
0.0000 [IU] | Freq: Three times a day (TID) | SUBCUTANEOUS | Status: DC
  Administered 2016-04-23: 3 [IU] via SUBCUTANEOUS

## 2016-04-23 MED ORDER — ALBUTEROL SULFATE (2.5 MG/3ML) 0.083% IN NEBU
2.5000 mg | INHALATION_SOLUTION | Freq: Once | RESPIRATORY_TRACT | Status: AC
  Administered 2016-04-23: 2.5 mg via RESPIRATORY_TRACT

## 2016-04-23 MED ORDER — FUROSEMIDE 10 MG/ML IJ SOLN
40.0000 mg | Freq: Three times a day (TID) | INTRAMUSCULAR | Status: DC
  Administered 2016-04-23 – 2016-04-24 (×3): 40 mg via INTRAVENOUS
  Filled 2016-04-23 (×3): qty 4

## 2016-04-23 MED ORDER — ASPIRIN 81 MG PO CHEW
81.0000 mg | CHEWABLE_TABLET | ORAL | Status: AC
  Administered 2016-04-23: 81 mg via ORAL
  Filled 2016-04-23: qty 1

## 2016-04-23 MED ORDER — HEPARIN (PORCINE) IN NACL 2-0.9 UNIT/ML-% IJ SOLN
INTRAMUSCULAR | Status: DC | PRN
  Administered 2016-04-23: 1000 mL

## 2016-04-23 MED ORDER — ACETAMINOPHEN 325 MG PO TABS: 650.0000 mg | ORAL_TABLET | ORAL | Status: DC | PRN

## 2016-04-23 MED ORDER — PANTOPRAZOLE SODIUM 40 MG PO TBEC
80.0000 mg | DELAYED_RELEASE_TABLET | Freq: Every day | ORAL | Status: DC
  Administered 2016-04-23 – 2016-04-25 (×3): 80 mg via ORAL
  Filled 2016-04-23 (×3): qty 2

## 2016-04-23 MED ORDER — INSULIN ASPART 100 UNIT/ML ~~LOC~~ SOLN
0.0000 [IU] | Freq: Every day | SUBCUTANEOUS | Status: DC
  Administered 2016-04-23: 4 [IU] via SUBCUTANEOUS
  Administered 2016-04-24: 2 [IU] via SUBCUTANEOUS

## 2016-04-23 MED ORDER — ONDANSETRON HCL 4 MG/2ML IJ SOLN: 4.0000 mg | Freq: Four times a day (QID) | INTRAMUSCULAR | Status: DC | PRN

## 2016-04-23 MED ORDER — POTASSIUM CHLORIDE CRYS ER 20 MEQ PO TBCR
40.0000 meq | EXTENDED_RELEASE_TABLET | Freq: Once | ORAL | Status: AC
  Administered 2016-04-23: 40 meq via ORAL
  Filled 2016-04-23: qty 2

## 2016-04-23 MED ORDER — LIDOCAINE HCL (PF) 1 % IJ SOLN
INTRAMUSCULAR | Status: DC | PRN
  Administered 2016-04-23: 2 mL

## 2016-04-23 MED ORDER — SODIUM CHLORIDE 0.9 % IV SOLN: 250.0000 mL | INTRAVENOUS | Status: DC | PRN

## 2016-04-23 MED ORDER — FERROUS GLUCONATE 324 (38 FE) MG PO TABS
324.0000 mg | ORAL_TABLET | Freq: Two times a day (BID) | ORAL | Status: DC
  Administered 2016-04-23 – 2016-04-25 (×4): 324 mg via ORAL
  Filled 2016-04-23 (×8): qty 1

## 2016-04-23 MED ORDER — INSULIN ASPART 100 UNIT/ML ~~LOC~~ SOLN
0.0000 [IU] | Freq: Three times a day (TID) | SUBCUTANEOUS | Status: DC
  Administered 2016-04-23 – 2016-04-24 (×2): 5 [IU] via SUBCUTANEOUS
  Administered 2016-04-24: 8 [IU] via SUBCUTANEOUS
  Administered 2016-04-24: 3 [IU] via SUBCUTANEOUS
  Administered 2016-04-25: 11 [IU] via SUBCUTANEOUS
  Administered 2016-04-25 (×2): 5 [IU] via SUBCUTANEOUS

## 2016-04-23 MED ORDER — SODIUM CHLORIDE 0.9% FLUSH: 3.0000 mL | INTRAVENOUS | Status: DC | PRN

## 2016-04-23 SURGICAL SUPPLY — 8 items
CATH BALLN WEDGE 5F 110CM (CATHETERS) ×1 IMPLANT
KIT HEART RIGHT NAMIC (KITS) ×1 IMPLANT
PACK CARDIAC CATHETERIZATION (CUSTOM PROCEDURE TRAY) ×2 IMPLANT
SHEATH FAST CATH BRACH 5F 5CM (SHEATH) ×1 IMPLANT
TRANSDUCER W/STOPCOCK (MISCELLANEOUS) ×2 IMPLANT
TUBING ART PRESS 72  MALE/FEM (TUBING) ×2
TUBING ART PRESS 72 MALE/FEM (TUBING) IMPLANT
TUBING CIL FLEX 10 FLL-RA (TUBING) ×1 IMPLANT

## 2016-04-23 NOTE — H&P (View-Only) (Signed)
Advanced Heart Failure Team Consult Note  Referring Physician: Dr. Gwenlyn Found  Primary Physician: Dr Eppie Gibson Primary Cardiologist:  Dr Stanford Breed  Reason for Consultation: Pulmonary HTN.   HPI:    [name] is a 66 y.o. female with h/o Bioprosthetic MVR 2012, h/o PAF on coumadin, recent GIB with coumadin stopped, IDDM, COPD, and tobacco abuse.   Pt admitted 04/18/16 with chest and left arm pain. Troponin elevated and NSTEMI called. Troponin peaked at 4.9 then trended downward. Other pertinent labs on admission include K 4.0, Creatinine 1.09, Hgb 9.9. CXR with mild interstitial edema along with chronic R sided rib fractures s/p plate and screw fixation.   Pt underwent LHC 04/21/16 with Left main - Ost LM Lesion, 30% stenosed and RCA - Ost RCA lesion, 40% stenosed. No target lesion found. HF team consulted with moderate to severe pulmonary Hypertension by Echo as below.   Feeling OK. Usually has no problems getting around the house.  Denies orthopnea, bendopnea, lightheadedness, dizziness, near syncope.  Pt current smoker of 1 ppd. Has smoked up to 2 ppd for > 50 years.  Stopped for 4 years after her MVR in 2012 but then restarted. Has been wearing CPAP for > 20 years. Has diagnosis of COPD as well. She had been off lasix for months at home. Was taking as needed. Prior to that was taking 80 mg daily per patient. Denies peripheral edema. Uses 02 at home with exertion and at night with CPAP.   Echo 04/21/16 LVEF 60-65%, RV severely dilated and systolic function mildly reduced.  PA peak pressure 76 mm Hg.   By comparison, PA peak pressure 33 mm Hg by Echo 08/2012 with normal RV  LHC 04/21/16 Left main - Ost LM Lesion, 30% stenosed  RCA - Ost RCA lesion, 40% stenosed.  AO 126/56 mm Hg LVEDP 20 mm Hg   Home Medications Prior to Admission medications   Medication Sig Start Date End Date Taking? Authorizing Provider  albuterol (PROAIR HFA) 108 (90 Base) MCG/ACT inhaler Inhale 1-2 puffs into the  lungs every 6 (six) hours as needed for shortness of breath. 11/14/15  Yes [name], MD  ALPRAZolam [name]) 1 MG tablet Take 0.5-1 tablets (0.5-1 mg total) by mouth 2 (two) times daily as needed for anxiety. Patient taking differently: Take 1 mg by mouth at bedtime.  02/13/16  Yes [name], MD  aspirin EC 325 MG tablet Take 1 tablet (325 mg total) by mouth daily. 03/29/16  Yes Lorella Nimrod, MD  chlorpheniramine (CHLOR-TRIMETON) 4 MG tablet Take 1 tablet (4 mg total) by mouth 3 (three) times daily as needed for allergies. 10/06/14  Yes [name], MD  cyclobenzaprine (FLEXERIL) 5 MG tablet Take 1 tablet (5 mg total) by mouth 3 (three) times daily as needed for muscle spasms. 01/11/16  Yes [name], MD  ferrous gluconate (FERGON) 324 MG tablet Take 1 tablet (324 mg total) by mouth 2 (two) times daily with a meal. Patient taking differently: Take 324 mg by mouth daily.  11/16/15  Yes [name], MD  FLUoxetine (PROZAC) 20 MG tablet Take 2 tablets (40 mg total) by mouth daily. 12/14/15  Yes [name], MD  fluticasone (FLONASE) 50 MCG/ACT nasal spray Place 2 sprays into both nostrils daily. Patient taking differently: Place 2 sprays into both nostrils at bedtime.  02/08/15  Yes [name], MD  Fluticasone-Salmeterol (ADVAIR DISKUS) 500-50 MCG/DOSE AEPB Inhale 1 puff into the lungs 2 (two) times daily. 12/14/15  Yes Purcell Nails  Eppie Gibson, MD  furosemide (LASIX) 80 MG tablet Take 1 tablet (80 mg total) by mouth 2 (two) times daily. Patient taking differently: Take 80 mg by mouth 2 (two) times daily as needed for fluid.  06/28/15  Yes [name], MD  gabapentin (NEURONTIN) 300 MG capsule Take 2 capsules (600 mg total) by mouth 3 (three) times daily. 11/16/15  Yes [name], MD  HYDROcodone-acetaminophen (NORCO/VICODIN) 5-325 MG tablet Take 1-2 tablets by mouth every 6 (six) hours as needed for severe pain. Patient taking differently: Take 2 tablets by mouth at bedtime.  11/16/15  Yes  [name], MD  insulin aspart (NOVOLOG) 100 UNIT/ML injection Inject 18-28 Units into the skin See admin instructions. Inject 18 units plus adjustment for CBG (sliding scale) - 3 times daily before meals   Yes Historical Provider, MD  Insulin Detemir (LEVEMIR FLEXTOUCH) 100 UNIT/ML Pen Inject 20 Units into the skin daily at 10 pm. Patient taking differently: Inject 45 Units into the skin daily before breakfast.  02/13/16  Yes [name], MD  lidocaine (XYLOCAINE) 2 % jelly APPLY TO AFFECTED AREA THREE TIMES A DAY Patient taking differently: APPLY TO AFFECTED AREA THREE TIMES A DAY AS NEEDED FOR PAIN 09/19/15  Yes Sid Falcon, MD  nystatin cream (MYCOSTATIN) APPLY TO AFFECTED AREA TWICE DAILY Patient taking differently: APPLY TO AFFECTED AREA TWICE DAILY AS NEEDED FOR RASH 04/03/16  Yes [name], MD  omeprazole (PRILOSEC) 40 MG capsule Take 1 capsule (40 mg total) by mouth 2 (two) times daily. 08/22/15  Yes [name], MD  OXYGEN Inhale 2 L into the lungs as needed (for shortness of breath while active).   Yes Historical Provider, MD  potassium chloride SA (K-DUR,KLOR-CON) 20 MEQ tablet TAKE 1 TABLET BY MOUTH TWICE DAILY Patient taking differently: TAKE 1 TABLET BY MOUTH TWICE DAILY AS NEEDED - WITH EACH DOSE OF LASIX 09/19/15  Yes Sid Falcon, MD  PRESCRIPTION MEDICATION Inhale into the lungs See admin instructions. CPAP - use whenever sleeping   Yes Historical Provider, MD  promethazine (PHENERGAN) 12.5 MG tablet Take 1 tablet (12.5 mg total) by mouth every 8 (eight) hours as needed for nausea or vomiting. 07/13/15  Yes Alex Ronnie Derby, DO  rosuvastatin (CRESTOR) 20 MG tablet TAKE 1 TABLET BY MOUTH EVERY NIGHT AT BEDTIME 09/19/15  Yes Sid Falcon, MD  SPIRIVA HANDIHALER 18 MCG inhalation capsule INHALE THE CONTENTS OF 1 CAPSULE VIA HANDIHALER BY MOUTH EVERY DAY 09/19/15  Yes Sid Falcon, MD  Blood Glucose Monitoring Suppl (ONETOUCH VERIO) W/DEVICE KIT 1 each by Does not apply route 4  (four) times daily. 07/25/14   [name], MD  EASY TOUCH PEN NEEDLES 31G X 5 MM [place] USE TO INJECT INSULIN 5 TIMES DAILY 07/26/15   [name], MD  Insulin Disposable Pump (V-GO 40) KIT FILL AND PLACE VGO40 ABOUT THE SAME TIME EACH DAY. USE 4 TO 5 CLICKS WITH MEALS AND 2 CLICKS WITH SNACKS Patient not taking: Reported on 04/18/2016 03/04/16   [name], MD  Insulin Syringe-Needle U-100 31G X 15/64" 0.5 ML MISC Use to inject insulin up to 4 times a day 04/17/16   [name], MD  Lancets Misc. (ACCU-CHEK FASTCLIX LANCET) KIT Check your blood 4 times a day dx code 250.00 insulin requiring 01/19/13   [name], MD  NOVOLOG 100 UNIT/ML injection USE TO FILL VGO40 TO WEAR DAILY. Patient not taking: Reported on 04/18/2016 03/04/16   [name], MD  [facility] VERIO test  strip Check blood sugar up to 4 times a day 02/28/15   [name], MD    Past Medical History: Past Medical History:  Diagnosis Date  . Adenomatous polyps 05/14/2011   Colonoscopy (05/2011): 4 mm adenomatous polyp excised endoscopically Colonoscopy (02/2002): Adenomatous polyp excised endoscopically   . Allergic rhinitis 06/01/2012  . Anemia of chronic disease 01/01/2013  . Anxiety 07/24/2010  . Aortic atherosclerosis ([place]) 10/19/2014   Seen on CT scan, currently asymptomatic  . 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 Asymptomatic cholelithiasis 09/25/2015   Seen on CT scan 08/2015  . Bilateral cataracts 08/04/2012   Visually insignificant   . Carotid artery stenosis    s/p right endarterectomy (06/2010) Carotid US (07/2010):  Left: Moderate-to-severe (60-79%) calcific and non-calcific plaque origin and proximal ICA and ECA   . Chronic congestive heart failure with left ventricular diastolic dysfunction ([place]) 10/21/2010  . Chronic constipation 02/03/2011  . Chronic daily headache 01/16/2014  . Chronic low back pain 10/06/2012  .  Chronic venous insufficiency 08/04/2012  . Clear cell renal cell carcinoma ([place]) 07/21/2011   s/p cryoablation of left RCC in 09/2011 by [name]. Followed by [name]  [facility]) .    Marland Kitchen COPD (chronic obstructive pulmonary disease) with emphysema (HCC)    PFTs 02/2012: FEV1 0.92 (40%), ratio 69, 27% increase in FEV1 with BD, TLC 91%, severe airtrapping, DLCO49% On chronic home O2. Pulmonary rehab referral 05/2012   . Depression 11/19/2005  . Esophageal stenosis 08/08/2015   Mild, benign-appearing on EGD 06/29/2015  . Fibromyalgia 08/29/2010  . Gastroesophageal reflux disease   . History of blood transfusion    "several times"   . Hyperlipidemia LDL goal < 100 11/20/2005  . Internal hemorrhoids 08/04/2012  . Mitral stenosis    s/p Mitral valve replacement with a 27-mm pericardial porcine valve (Medtronic Mosaic valve, serial #[id number] on 09/20/10, Dr. Prescott Gum)   . Moderate to severe pulmonary hypertension    2014 TEE w PA peak pressure 46 mmHg, s/p MV replacement   . Obesity (BMI 30.0-34.9) 10/23/2011  . Obstructive sleep apnea    Nocturnal polysomnography (06/2009): Moderate sleep apnea/ hypopnea syndrome , AHI 17.8 per hour with nonpositional hypopneas. CPAP titration to 12 CWP, AHI 2.4 per hour. On nocturnal CPAP via a small resMed Quattro full-face mask with heated humidifier.   . Osteoporosis    DEXA (12/09/2011): L-spine T -3.7, left hip T -1.4 DEXA (12/2004): L-spine T -2.6, left hip -0.1   . Paroxysmal atrial fibrillation ([place]) 10/22/2010   s/p Left atrial maze procedure for paroxysmal atrial fibrillation on 09/20/2010 by Dr Prescott Gum.  Subsequent splenic infarct, decision was made to re-anticoagulate with coumadin, likely life-long as this is the most likely cause of the splenic infarct.   . Right nephrolithiasis 09/06/2014   5 mm non-obstructing calculus seen on CT scan 09/05/2014   . Seborrheic keratosis 09/28/2015  . Shortness of breath dyspnea   . Tobacco abuse  07/28/2012  . Type 2 diabetes mellitus with diabetic neuropathy [facility])     Past Surgical History: Past Surgical History:  Procedure Laterality Date  . CARDIAC VALVE REPLACEMENT  Aug. 2012   "mitral valve"  . CAROTID ENDARTERECTOMY Right 07/04/2010   by [name] for asymptomatic right carotid artery stenosis  . CHEST TUBE INSERTION  09/24/2010   Dr Prescott Gum  . COLONOSCOPY  05/12/2011   performed by Dr. Michail Sermon. Showing small internal  hemorrhoids, single tubular adenoma polyp  . CRYOABLATION Left 09/2011   by [name]. Followed by [name]  [facility]) .    Marland Kitchen DILATION AND CURETTAGE OF UTERUS    . ESOPHAGOGASTRODUODENOSCOPY  05/12/2011   performed by Dr. Michail Sermon. Negative for ulcerations, biopsy negative for evidence of celiac sprue  . ESOPHAGOGASTRODUODENOSCOPY N/A 06/29/2015   Procedure: ESOPHAGOGASTRODUODENOSCOPY (EGD);  Surgeon: Clarene Essex, MD;  Location: [facility] ENDOSCOPY;  Service: Endoscopy;  Laterality: N/A;  . ESOPHAGOGASTRODUODENOSCOPY N/A 03/29/2016   Procedure: ESOPHAGOGASTRODUODENOSCOPY (EGD);  Surgeon: Clarene Essex, MD;  Location: [facility] END[facility]COPY;  Service: Endoscopy;  Laterality: N/A;  . FRACTURE SURGERY Right 2005   clavicle  . GIVENS CAPSULE STUDY N/A 06/30/2015   Procedure: GIVENS CAPSULE STUDY;  Surgeon: Clarene Essex, MD;  Location: [facility] ENDOSCOPY;  Service: Endoscopy;  Laterality: N/A;  . GIVENS CAPSULE STUDY N/A 06/29/2015   Procedure: GIVENS CAPSULE STUDY;  Surgeon: Clarene Essex, MD;  Location: [place];  Service: Endoscopy;  Laterality: N/A;  . Hysteroscopy with endometrial ablation  06/2001   for persistent post-menopausal bleeding // by S. [name], M.D.  . IR GENERIC HISTORICAL  08/23/2015   IR RADIOLOGIST EVAL & MGMT 08/23/2015 Aletta Edouard, MD [facility] INTERV RAD  . IR GENERIC HISTORICAL  04/09/2016   IR RADIOLOGIST EVAL & MGMT 04/09/2016 Aletta Edouard, MD [facility] INTERV RAD  . LEFT HEART CATH AND CORONARY ANGIOGRAPHY N/A 04/21/2016   Procedure: Left Heart  Cath and Coronary Angiography;  Surgeon: Lorretta Harp, MD;  Location: [place] [facility];  Service: Cardiovascular;  Laterality: N/A;  . LIPOMA EXCISION  08/2005   occipital lipoma 1.5cm - by Dr. [name]  . MAZE Left 09/20/10   for paroxysmal atrial fibrillation (Dr. Prescott Gum)  . MITRAL VALVE REPLACEMENT  09/20/10    with a 27-mm pericardial porcine valve (Medtronic Mosaic valve, serial #[id number]). 09/20/10, Dr Prescott Gum  . ORIF CLAVICLE FRACTURE  01/2004   by Thana Farr. [name], M.D for Right clavicle nonunion.  . TONSILLECTOMY    . TUBAL LIGATION      Family History: Family History  Problem Relation Age of Onset  . Peptic Ulcer Disease Father   . Heart attack Father 15    Died of MI at age 66  . Heart attack Brother 61    Died of MI at age 56  . Obesity Brother   . Pneumonia Mother   . Healthy Sister   . Lupus Daughter   . Obsessive Compulsive Disorder Daughter     Social History: Social History   Social History  . Marital status: Divorced    Spouse name: N/A  . Number of children: N/A  . Years of education: N/A   Social History Main Topics  . Smoking status: Current Every Day Smoker    Packs/day: 1.00    Years: 53.00    Types: Cigarettes    Start date: 11/01/1962  . Smokeless tobacco: Never Used     Comment: 1/2 to 1 pack per day  . Alcohol use No  . Drug use: No  . Sexual activity: No   Other Topics Concern  . None   Social History Narrative   Lives alone in [place] with 60 pound chow   Worked at [employer] for 18 years   No car    Allergies:  Allergies  Allergen Reactions  . Morphine And Related Other (See Comments)    Injection site reaction  . Oxycontin [Oxycodone] Other (See Comments)  headache  . Tramadol Hcl Swelling    Ankle swelling  . Lorazepam Other (See Comments)    Patient's sister noted that ativan caused the patient to become extremely confused during hospitalization 09/2010; tolerates Xanax     Objective:    Vital Signs:   Temp:  [97.7 F (36.5 C)-98.2 F (36.8 C)] 97.7 F (36.5 C) (03/13 0526) Pulse Rate:  [0-99] 49 (03/13 0526) Resp:  [0-78] 16 (03/13 0526) BP: (111-156)/(32-64) 129/41 (03/13 0526) SpO2:  [0 %-100 %] 96 % (03/13 1034) Weight:  [193 lb 4.8 oz (87.7 kg)] 193 lb 4.8 oz (87.7 kg) (03/13 0526) Last BM Date: 04/18/16  Weight change: Filed Weights   04/21/16 0410 04/21/16 0600 04/22/16 0526  Weight: 185 lb 4.8 oz (84.1 kg) 193 lb 6.4 oz (87.7 kg) 193 lb 4.8 oz (87.7 kg)    Intake/Output:   Intake/Output Summary (Last 24 hours) at 04/22/16 1146 Last data filed at 04/22/16 1004  Gross per 24 hour  Intake          1313.83 ml  Output              600 ml  Net           713.83 ml     Physical Exam: General:  Obese. Short. NAD HEENT: Normal. Neck: supple. JVP difficult, but appears 11-12 cm. Carotids 2+ bilat; no bruits. No lymphadenopathy or thyromegaly appreciated. Cor: PMI nondisplaced. RRR. Brady at times. No rubs, gallops or murmurs. Lungs: Diminished throughout.  Abdomen: Obese, soft, NT, ND, no HSM. No bruits or masses. +BS.  Extremities: no cyanosis, clubbing, rash, 1+ edema 1/3 way to knee.  Neuro: alert & orientedx3, cranial nerves grossly intact. moves all 4 extremities w/o difficulty. Affect pleasant  Telemetry: Reviewed, Sinus brady 50-60s.   Labs: Basic Metabolic Panel:  Recent Labs Lab 04/18/16 2208 04/19/16 0428 04/21/16 0527 04/22/16 0427  NA 140 137 135 138  K 3.6 4.0 4.2 4.5  CL 101 101 103 106  CO2 _0 GLUCOSE 81 202* 213* 153*  BUN _1 CREATININE 1.09* 1.01* 1.13* 0.95  CALCIUM 9.6 9.1 8.8* 8.9    Liver Function Tests: No results for input(s): AST, ALT, ALKPHOS, BILITOT, PROT, ALBUMIN in the last 168 hours. No results for input(s): LIPASE, AMYLASE in the last 168 hours. No results for input(s): AMMONIA in the last 168 hours.  CBC:  Recent Labs Lab 04/18/16 2208 04/19/16 0428  04/20/16 0510 04/21/16 0527 04/22/16 0427  WBC 9.4 9.8 8.1 6.6 7.0  HGB 9.9* 9.7* 9.3* 8.8* 8.7*  HCT 32.9* 32.9* 31.4* 28.5* 29.2*  MCV 94.0 93.2 93.5 92.8 93.6  PLT 215 240 242 168 169    Cardiac Enzymes:  Recent Labs Lab 04/19/16 0428 04/19/16 1039 04/20/16 0912  TROPONINI 2.90* 4.98* 2.35*    BNP: BNP (last 3 results)  Recent Labs  06/27/15 1139  BNP 383.9*    ProBNP (last 3 results) No results for input(s): PROBNP in the last 8760 hours.   CBG:  Recent Labs Lab 04/21/16 1122 04/21/16 1628 04/21/16 2144 04/22/16 0746 04/22/16 1115  GLUCAP 152* 186* 263* 173* 190*    Coagulation Studies:  Recent Labs  04/21/16 0527  LABPROT 14.0  INR 1.07    Other results: EKG: 04/20/16 Sinus brady with 1st degree AV block. 56 bpm.   Imaging:  No results found.   Medications:     Current Medications: . aspirin  81 mg Oral  Daily  . enoxaparin (LOVENOX) injection  40 mg Subcutaneous Daily  . ferrous gluconate  324 mg Oral BID WC  . FLUoxetine  40 mg Oral Daily  . fluticasone  2 spray Each Nare QHS  . furosemide  40 mg Oral BID  . gabapentin  600 mg Oral TID  . insulin aspart  0-15 Units Subcutaneous TID WC  . insulin detemir  30 Units Subcutaneous QHS  . metoprolol tartrate  25 mg Oral BID  . mometasone-formoterol  2 puff Inhalation BID  . nicotine  21 mg Transdermal Daily  . rosuvastatin  20 mg Oral QHS  . sodium chloride flush  3 mL Intravenous Q12H  . tiotropium  18 mcg Inhalation Daily     Infusions:    Assessment/Plan   [name] is a 66 y.o. female with bioprosthetic MVR, PAF, DM2, and Tobacco abuse. Admitted for chest pain and NSTEMI. No "culprit lesion" on LHC. AHF team consulted to see with PA peak pressure 76 mm Hg on Echo.   1. Pulmonary HTN - PA peak pressure of 76 mm HG on Echo. No RHC or PFTs in system.  Likely at least Moderate to Severe PAH. - Suspect combination of WHO group II and III with long standing mitral  valve disease and COPD.  Will need PFTs and RHC to further quantify. If patient adamant to go home can consider as outpatient.  - RV dilated and hypokinetic.  - Started on lasix 40 mg BID ( was taking only prn at home previously).   May need more.  JVP elevated but ? related to her RV dysfunction.  2. NSTEMI - Troponins peaked at 4.9. No culprit lesion on CAD. Thought to be demand ischemia secondary to anemia. - With troponin elevation and no CAD, need to consider PE. Needs VQ Scan.  3. Paroxysmal Afib s/p MAZE 2012 - Currently in sinus bradycardia by tele and EKG.  - Coumadin stopped 03/2016 and put on ASA.  Tolerated IV heparin earlier this admission. - This patients CHA2DS2-VASc Score is at least 5.  - Will eventually need to resume coumadin vs NOAC 4. H/o GI bleed - EGD 03/28/16 normal - Hgb with slight down trend during admission. No overt bleeding. Continue to follow closely.  5. Tobacco abuse - Needs to stop completely. > 50 pack years.  - Encourage cessation.  6. COPD - With long history of smoking.  No PFTs in system. Likely contributor to her Pulmonary HTN.  - Continue home meds. - She is supposed to use 02 with exertion and with her CPAP.  7. Bradycardia - Stable in 50-60s by tele.  Would follow closely.  - No indication for pacemaker at this time.  8. OSA - Continue CPAP.     If goes home today will need to be seen in HF clinic in next few weeks. Please hold until seen by HF MD.   Length of Stay: 3  Shirley Friar, PA-C  04/22/2016, 11:46 AM  Advanced Heart Failure Team Pager [phone] (M-F; 7a - 4p)  Please contact [facility] for night-coverage after hours (4p -7a ) and weekends on [email or web address]  Patient seen with PA, agree with the above note.   1. Pulmonary hypertension with RV failure: Severe PH by echo with RV dysfunction (EF 60-65%, mild LVH, normally functioning bioprosthetic aortic valve, severely dilated RV mildly decreased systolic function, PASP 76  mmHg).  She has multiple reasons for pulmonary hypertension: OSA, COPD, prior mitral valve disease with  possible pulmonary vascular remodeling and PH despite valve replacement, and diastolic CHF (group 2 PH).  Exam is difficult, but I think that she is volume overloaded (primarily RV failure most likely).  I think she needs diuresis.  She also needs to use CPAP nightly (is doing so) and quit smoking.  I am unsure if she would be a candidate for selective pulmonary vasodilators.  If PH is out of proportion to COPD and is not primarily due to left heart failure (group 2), then selective pulmonary vasodilators would be a consideration.  - She needs RHC to define LV and RV filling pressures and PA pressure.  If primarily pulmonary venous hypertension, she may just need diuresis.  We discussed risks/benefits of RHC and she agrees to proceed (will do tomorrow).  - Needs PFTs to define severity of COPD.  - I will get V/Q scan to rule out chronic PEs.  - Given evidence for volume overload, start Lasix 40 mg IV bid. Will get a better idea of how much diuresis is needed from [facility] tomorrow.  2. Chest pain and elevated troponin: ?Demand ischemia from volume overload/RV failure.  Coronary angiography showed nonobstructive CAD.  She is on ASA 81 and statin.  3. S/p MV replacement: Bioprosthetic mitral valve.  The valve looked ok on echo this admission.  She had mitral stenosis as the lesion triggering the valve repair.  As above, it is possible that she developed pulmonary vascular remodeling in the setting of long-standing mitral valve disease that did not correct with valve replacement.  This could lead to a group 1-like form of pulmonary hypertension.  4. COPD: No recent PFTs.  She has home oxygen but rarely uses it.  She continues to smoke.  - I strongly encouraged smoking cessation.  - She will get PFTs.  5. OSA: Continue CPAP.  6. Anemia: Fe deficiency and chronic disease.  Chronic slow GI bleed suspected from  small bowel AVMs.  After last bleeding episode in the hospital, warfarin was stopped. With bioprosthetic mitral valve for mitral stenosis, warfarin is the only anticoagulant indicated. Will need to reassess with patient and Dr. Eppie Gibson (has followed her for a long time) whether we should restart coumadin, maybe using a lower INR goal such as 1.5-2.5.  7. Atrial fibrillation: Paroxysmal, s/p MAZE with mitral valve replacement.  Currently NSR.  She is off warfarin due to GI bleeding.   Loralie Champagne 04/22/2016 2:40 PM

## 2016-04-23 NOTE — Progress Notes (Signed)
Patient states she can place herself on CPAP when ready. RT will continue to monitor.

## 2016-04-23 NOTE — Interval H&P Note (Signed)
History and Physical Interval Note:  04/23/2016 8:49 AM  [name]  has presented today for surgery, with the diagnosis of pulmonary hypertension  The various methods of treatment have been discussed with the patient and family. After consideration of risks, benefits and other options for treatment, the patient has consented to  Procedure(s): Right Heart Cath (N/A) as a surgical intervention .  The patient's history has been reviewed, patient examined, no change in status, stable for surgery.  I have reviewed the patient's chart and labs.  Questions were answered to the patient's satisfaction.     D[place] [employer]

## 2016-04-23 NOTE — Progress Notes (Signed)
Pt anxious and wanting to speak with a Chaplain before scheduled cardiac cath. Order placed. On-call chaplain busy at the time of order. Chaplain said they can see her in the AM before pt's cath.

## 2016-04-23 NOTE — Progress Notes (Signed)
SUBJECTIVE: Got one dose of IV Lasix yesterday, thinks breathing is marginally better.  RHC today, see findings below.  V/Q scan on 3/13 with no evidence for chronic PEs.   Echo 04/21/16 LVEF 60-65%, RV severely dilated and systolic function mildly reduced.  PA peak pressure 76 mm Hg.   By comparison, PA peak pressure 33 mm Hg by Echo 08/2012 with normal RV  LHC 04/21/16 Left main - Ost LM Lesion, 30% stenosed  RCA - Ost RCA lesion, 40% stenosed.  AO 126/56 mm Hg LVEDP 20 mm Hg  RHC (3/14): Hemodynamics (mmHg) RA mean 14 RV 77/16 PA 77/28, mean 47 PCWP mean 23 Oxygen saturations: PA 53% AO 93% Cardiac Output (Fick) 4.86  Cardiac Index (Fick) 2.6 PVR 4.9 WU   Vitals:   04/23/16 0905 04/23/16 0908 04/23/16 0922 04/23/16 0927  BP: (!) 154/66 (!) 165/63 (!) 143/43 (!) 134/48  Pulse: 62 60 (!) 57 (!) 55  Resp: 13 (!) _0 Temp:      TempSrc:      SpO2: 92% 91% 96% 98%  Weight:      Height:        Intake/Output Summary (Last 24 hours) at 04/23/16 0928 Last data filed at 04/23/16 0807  Gross per 24 hour  Intake             1080 ml  Output             2400 ml  Net            -1320 ml    LABS: Basic Metabolic Panel:  Recent Labs  04/22/16 0427 04/23/16 0505  NA 138 135  K 4.5 3.8  CL 106 100*  CO2 25 27  GLUCOSE 153* 230*  BUN 18 21*  CREATININE 0.95 1.04*  CALCIUM 8.9 9.3   Liver Function Tests: No results for input(s): AST, ALT, ALKPHOS, BILITOT, PROT, ALBUMIN in the last 72 hours. No results for input(s): LIPASE, AMYLASE in the last 72 hours. CBC:  Recent Labs  04/22/16 0427 04/23/16 0505  WBC 7.0 6.8  HGB 8.7* 9.4*  HCT 29.2* 30.8*  MCV 93.6 91.9  PLT 169 166   Cardiac Enzymes: No results for input(s): CKTOTAL, CKMB, CKMBINDEX, TROPONINI in the last 72 hours. BNP: Invalid input(s): POCBNP D-Dimer: No results for input(s): DDIMER in the last 72 hours. Hemoglobin A1C: No results for input(s): HGBA1C in the last 72 hours. Fasting  Lipid Panel: No results for input(s): CHOL, HDL, LDLCALC, TRIG, CHOLHDL, LDLDIRECT in the last 72 hours. Thyroid Function Tests: No results for input(s): TSH, T4TOTAL, T3FREE, THYROIDAB in the last 72 hours.  Invalid input(s): FREET3 Anemia Panel: No results for input(s): VITAMINB12, FOLATE, FERRITIN, TIBC, IRON, RETICCTPCT in the last 72 hours.  RADIOLOGY: Dg Chest 2 View  Result Date: 04/22/2016 CLINICAL DATA:  Chest pain and dry cough EXAM: CHEST  2 VIEW COMPARISON:  04/18/2016 FINDINGS: Cardiac shadow is within normal limits. Postsurgical changes are again seen. Aortic calcifications are again noted. Multiple right rib fractures are again seen with healing. Chronic blunting of left costophrenic angle is noted. No focal infiltrate or sizable effusion is seen. IMPRESSION: No acute abnormality noted. Electronically Signed   By: Inez Catalina M.D.   On: 04/22/2016 17:49   Dg Chest 2 View  Result Date: 04/18/2016 CLINICAL DATA:  Mid chest pain x2 hours for chest swelling. EXAM: CHEST  2 VIEW COMPARISON:  06/27/2015 FINDINGS: Stable cardiomegaly. The aorta is atherosclerotic without aneurysm.  Mild interstitial edema without pneumonic consolidation, effusion or pneumothorax. The patient is status post median sternotomy. Old right-sided rib fractures are noted with healing. Plate and screw fixation of the mid clavicle. Vascular clips are seen at along the right side of the neck. IMPRESSION: Mild interstitial edema. Aortic atherosclerosis. Multiple chronic right-sided rib fractures. Plate and screw fixation of the mid right clavicle. Electronically Signed   By: Ashley Royalty M.D.   On: 04/18/2016 22:09   Ct Head Wo Contrast  Result Date: 04/18/2016 CLINICAL DATA:  Headache and left upper extremity pain for 2 weeks. EXAM: CT HEAD WITHOUT CONTRAST TECHNIQUE: Contiguous axial images were obtained from the base of the skull through the vertex without intravenous contrast. COMPARISON:  01/07/2013 FINDINGS:  Brain: There is no intracranial hemorrhage, mass or evidence of acute infarction. There is no extra-axial fluid collection. Gray matter and white matter appear normal. Cerebral volume is normal for age. Brainstem and posterior fossa are unremarkable. The CSF spaces appear normal. Vascular: No hyperdense vessel or unexpected calcification. Skull: Normal. Negative for fracture or focal lesion. Sinuses/Orbits: No acute finding. Other: None. IMPRESSION: Normal brain Electronically Signed   By: Andreas Newport M.D.   On: 04/18/2016 22:11   Nm Pulmonary Perf And Vent  Result Date: 04/22/2016 CLINICAL DATA:  History of COPD, chest pain EXAM: NUCLEAR MEDICINE VENTILATION - PERFUSION LUNG SCAN TECHNIQUE: Ventilation images were obtained in multiple projections using inhaled aerosol Tc-26mDTPA. Perfusion images were obtained in multiple projections after intravenous injection of Tc-951mAA. RADIOPHARMACEUTICALS:  33 mCi Technetium-9988mPA aerosol inhalation and 4.15 mCi Technetium-62m56m IV COMPARISON:  Radiograph 04/22/2016 FINDINGS: Ventilation: Heterogeneous ventilation, consistent with obstructive airways disease. Perfusion: Mild heterogeneous perfusion, but no large wedge-shaped peripheral defects. Heterogenous perfusion is in the general distribution of the heterogeneous lung ventilation. IMPRESSION: 1. Overall low probability study for pulmonary embolus given heterogenous perfusion. 2. Heterogenous ventilation, consistent with obstructive airways disease. Electronically Signed   By: Kim Donavan Foil.   On: 04/22/2016 20:25   Ct Abdomen W Wo Contrast  Result Date: 03/28/2016 CLINICAL DATA:  Acute onset of generalized abdominal pain. Acute blood loss. Initial encounter. EXAM: CT ABDOMEN WITHOUT AND WITH CONTRAST TECHNIQUE: Multidetector CT imaging of the abdomen was performed following the standard protocol before and following the bolus administration of intravenous contrast. CONTRAST:  100 mL of Isovue  300 IV contrast COMPARISON:  CT of the abdomen performed 08/23/2015, and MRI of the lumbar spine performed 11/15/2015 FINDINGS: Lower chest: A bleb is noted at the right lung base. Scattered calcification is noted at the aortic and mitral valves, with postoperative change at the mitral valve. Scattered coronary artery calcifications are seen. Hepatobiliary: The liver is unremarkable in appearance. The gallbladder is unremarkable in appearance. The common bile duct remains normal in caliber. Trace fluid is seen about the liver. Pancreas: The pancreas is within normal limits. Spleen: The spleen is unremarkable in appearance. Adrenals/Urinary Tract: The adrenal glands are unremarkable in appearance. Post ablation changes at the posterior aspect of the lower pole of the left kidney are grossly unchanged from 2017. There is no evidence of recurrent mass, with a mixture fatty attenuation and soft tissue attenuation. Mild delayed enhancement at the small 1.2 cm soft tissue component along the posterior aspect of the ablation area is stable in appearance. A 6 mm nonobstructing stone is noted at the interpole region of the right kidney. Mild nonspecific perinephric stranding is noted bilaterally. There is no evidence of hydronephrosis. No obstructing ureteral  stones are identified. No perinephric stranding is seen. Stomach/Bowel: Visualized small and large bowel loops are grossly unremarkable in appearance. The stomach is largely decompressed and unremarkable. Vascular/Lymphatic: Diffuse calcification is seen along the abdominal aorta and its branches. The abdominal aorta is otherwise grossly unremarkable. The inferior vena cava is grossly unremarkable. No retroperitoneal lymphadenopathy is seen. Other: There is question of mild soft tissue inflammation at the right lower quadrant. Would correlate clinically for any evidence of appendicitis, as the appendix is not imaged on this study. Musculoskeletal: No acute osseous  abnormalities are identified. The visualized musculature is unremarkable in appearance. IMPRESSION: 1. Stable appearance to post ablation change at the lower pole of the left kidney, with a small 1.2 cm focus of enhancing soft tissue again noted along the posterior aspect of the region, thought to reflect enhancing scar tissue. No evidence of local recurrence. 2. Question of mild soft tissue inflammation at the right lower quadrant, incompletely imaged on this study. Would correlate clinically for any evidence of appendicitis, as the appendix is not imaged on this study. 3. Diffuse aortic atherosclerosis. 4. 6 mm nonobstructing stone at the interpole region of the right kidney. 5. Bleb at the right lung base. Scattered calcification at the aortic and mitral valves. Scattered coronary artery calcifications seen. Electronically Signed   By: Garald Balding M.D.   On: 03/28/2016 01:53   Ir Radiologist Eval & Mgmt  Result Date: 04/18/2016 Please refer to notes tab for details about interventional procedure. (Op Note)   PHYSICAL EXAM General: NAD Neck: Thick, JVP appears elevated about 12 cm, no thyromegaly or thyroid nodule.  Lungs: Clear to auscultation bilaterally with normal respiratory effort. CV: Nondisplaced PMI.  Heart regular S1/S2, no S3/S4, no murmur.  1+ ankle edema.   Abdomen: Soft, nontender, no hepatosplenomegaly, no distention.  Neurologic: Alert and oriented x 3.  Psych: Normal affect. Extremities: No clubbing or cyanosis.   TELEMETRY: Reviewed telemetry pt in NSR  ASSESSMENT AND PLAN: [name] is a 66 y.o. female with bioprosthetic MVR, PAF, DM2, and Tobacco abuse. Admitted for chest pain and NTSEMI. No "culprit lesion" on LHC. AHF team consulted to see with PA peak pressure 76 mm Hg on Echo.  1. Pulmonary hypertension with RV failure: Severe PH by echo with RV dysfunction (EF 60-65%, mild LVH, normally functioning bioprosthetic aortic valve, severely dilated RV mildly  decreased systolic function, PASP 76 mmHg).  She has multiple reasons for pulmonary hypertension: OSA, COPD, prior mitral valve disease with possible pulmonary vascular remodeling and PH despite valve replacement, and diastolic CHF (group 2 PH). V/Q scan showed no chronic PE.  RHC today showed moderate to severe PH, mixed pulmonary venous and pulmonary arterial hypertension.  Right and left heart filling pressures elevated, suggestive of diastolic CHF as well as RV failure.  - She clearly needs more diuresis with elevated right and left heart filling pressures on cath.  Will increase Lasix to 40 mg IV every 8 hrs.  - PH is mixed pulmonary venous and arterial with PVR 4.9 WU.  She needs diuresis.  The pulmonary arterial hypertension component may be primarily group 3 PH due to underlying lung disease (COPD, OSA).  However, cannot completely rule out group 1 component => need PFTs.  If pulmonary hypertension looks out of proportion to severity of PFT abnormality, could consider trial of selective pulmonary vasodilators (with underlying lung disease from COPD, Tyvaso inhaled may be best option to avoid increased ventilation/perfusion mismatching).  2. Chest pain and  elevated troponin: ?Demand ischemia from volume overload/RV failure.  Coronary angiography showed nonobstructive CAD.  She is on ASA 81 and statin.  3. S/p MV replacement: Bioprosthetic mitral valve.  The valve looked ok on echo this admission.  She had mitral stenosis as the lesion triggering the valve repair.  As above, it is possible that she developed pulmonary vascular remodeling in the setting of long-standing mitral valve disease that did not correct with valve replacement.  This could lead to a group 1-like form of pulmonary hypertension (see above discussion, awaiting PFTs to see if it looks like there is PH out of proportion to COPD).  4. COPD: No recent PFTs.  She has home oxygen but rarely uses it.  She continues to smoke.  - I strongly  encouraged smoking cessation.  - She will get PFTs, hopefully today.   5. OSA: Continue CPAP.  6. Anemia: Fe deficiency and chronic disease.  Chronic slow GI bleed suspected from small bowel AVMs.  After last bleeding episode in the hospital, warfarin was stopped. With bioprosthetic mitral valve for mitral stenosis, warfarin is the only anticoagulant indicated. Will need to reassess with patient and Dr. Eppie Gibson (has followed her for a long time) whether we should restart coumadin, maybe using a lower INR goal such as 1.5-2.5.  7. Atrial fibrillation: Paroxysmal, s/p MAZE with mitral valve replacement.  Currently NSR.  She is off warfarin due to GI bleeding.  [name] 04/23/2016 9:36 AM

## 2016-04-23 NOTE — Care Management Important Message (Signed)
Important Message  Patient Details  Name: [name] MRN: [id number] Date of Birth: 23-Jul-1950   Medicare Important Message Given:  Yes    Iris Montine Circle 04/23/2016, 11:55 AM

## 2016-04-23 NOTE — Progress Notes (Signed)
Pt lethargic, unable to complete medication pass at this time will reevaluate in 64mnutes. Pt left brachial clean dry and intact, no signs of bleeding or hematoma. Pt reports no pain.  [name]

## 2016-04-23 NOTE — Progress Notes (Signed)
Pt awake, alert and oriented x4. Pt tolerated water well, and pudding. Progressed diet from liquids to heart healthy    [name]

## 2016-04-23 NOTE — Progress Notes (Signed)
Subjective:  Underwent RHC early this morning. Sleepy and not very interested in conversing with team this AM. No acute complaints, mainly interested in getting some breakfast and going back to sleep.   Net -2.6L and - 3 lbs since yesterday. Telemetry reviewed, uneventful  Objective:  Vital signs in last 24 hours: Vitals:   04/22/16 1507 04/22/16 2005 04/22/16 2255 04/23/16 0522  BP: (!) 153/53 (!) 135/42  (!) 131/52  Pulse: (!) 57 (!) 55 62 (!) 59  Resp: _0 Temp:  97.7 F (36.5 C)  97.5 F (36.4 C)  TempSrc:  Oral  Oral  SpO2: 95% 95%  95%  Weight:    190 lb 4.8 oz (86.3 kg)  Height:        Intake/Output Summary (Last 24 hours) at 04/23/16 1448 Last data filed at 04/23/16 1333  Gross per 24 hour  Intake             1080 ml  Output             3700 ml  Net            -2620 ml   Filed Weights   04/21/16 0600 04/22/16 0526 04/23/16 0522  Weight: 193 lb 6.4 oz (87.7 kg) 193 lb 4.8 oz (87.7 kg) 190 lb 4.8 oz (86.3 kg)   General: Elderly woman resting comfortably in bed, sleepy, falling asleep during conversation HEENT: [place]/AT, moist mucus membranes CV: Slow rate and regular rhythm, no m/g/r Pulm/Chest: CTA bil, normal work of breathing Ext: No edema. Warm and well-perfused, left arm bandaged and no evidence of bleeding from cath entry site Skin: Warm, dry, intact  3/13 - VQ scan low risk for PE   -  DG chest 2 view - no acute abnormality  3/14 - RHC  1. Elevated right and left heart filling pressures.  2. Moderate to severe pulmonary hypertension.  This appears to be mixed pulmonary venous/pulmonary arterial hypertension.  Suspect group 2 + group 3 PH, ?component of group 1.   PA 77/28, mean 47 PCWP mean 23  Assessment/Plan:  Pulmonary hypertension, and right-sided heart failure, TTE on 3/12 showed LVEF 60-65%, RV dilated and hypokinetic with severely increased pulmonary artery pressure (estimated 76 mmHg). PAH likely multifactorial from COPD/OSA (group 3),  and some left sided HF as LVEDP was elevated to 20 mm Hg (group 2). Right heart cath today revealed moderated to severe pulmonary HTN and elevated R and L heart filling pressures. VQ inconsistent with chronic PE.  -- Appreciated HF team recommendations -- IV Lasix 40 mg Q8H -- Obtaining PFTs today, per HF considering pulmonary vasodilators   NSTEMI: Troponins peaked at 4.98 on 3/10 and trended back down. On heparin gtt over the weekend and has been without chest pain. Underwent LHC today 3/12 which showed minimal CAD and no culprit lesion. Likely has ischemic event of small coronary vessel not seen on cath or demand ischemia in setting of hypervolemia.  - Appreciate Cardiology recs - Continue ASA, Crestor  Iron deficiency anemia: Recent hospitalization for GI bleed secondary to AVM. Discontinued anticoag. Hb stable 8-10 - Follow CBC - Continue home iron   GERD, resolved today, was having persistent reflux symptoms and chest wall pain that did not seem to respond to Pepcid or Protonix -- GI cocktail TID PRN -- Voltaren gel QID PRN -- Pepcid 20 mg BID PRN -- Zofran and Phenergan PRN for nausea  PAF and s/p mitral valve repair, bioprosthetic MVR:  Coumadin discontinued after GI bleed 03/2016. Has porcine valve. CHADSVASC score 6. Valve functioning normally on TTE 3/12. -- Telemetry -- Metoprolol 25 mg BID -- Continue home ASA   Type 2 DM: CBGs uncontrolled yesterday in high 200s, increase levemir from 20 to 30 QHS, was NPO much of today, now on liquid diet. Last A1c 7.6 in 12/2015 - Continue Levemir 30 units QHS - SSI-M -- Added 0-5 units sliding scale QHS coverage  COPD/OSA: Stable -- Follow PFTs today - Continue home Advair and Spiriva - CPAP QHS  Depression/Anxiety: - Continue home Fluoxetine and Alprazolam   Chronic Pain/Fibromyalgia:  - Continue home Norco, Flexeril  Tobacco Abuse: Smokes 1 pack per day. - Nicotine patch  Diet: Heart healthy  DVT PPx: Heparin gtt  Dispo:  Anticipated discharge in 1-2 days.  Asencion Partridge, MD 04/23/2016, 8:00 AM Pager: [phone]

## 2016-04-23 NOTE — Progress Notes (Signed)
Inpatient Diabetes Program Recommendations  AACE/ADA: New Consensus Statement on Inpatient Glycemic Control (2015)  Target Ranges:  Prepandial:   less than 140 mg/dL      Peak postprandial:   less than 180 mg/dL (1-2 hours)      Critically ill patients:  140 - 180 mg/dL    Results for [name], [name] (MRN [id number]) as of 04/23/2016 07:40  Ref. Range 04/22/2016 07:46 04/22/2016 11:15 04/22/2016 17:49 04/22/2016 21:52  Glucose-Capillary Latest Ref Range: 65 - 99 mg/dL 173 (H) 190 (H) 256 (H) 290 (H)   Results for [name], [name] (MRN [id number]) as of 04/23/2016 07:40  Ref. Range 04/23/2016 05:05  Glucose Latest Ref Range: 65 - 99 mg/dL 230 (H)    Home DM Meds: Levemir 45 units daily                             Novolog 18 units TID with meals                             Novolog SSI   Current Insulin Orders: Levemir 30 units QHS                                       Novolog Moderate Correction Scale/ SSI (0-15 units) TID AC      MD- Please consider the following in-hospital insulin adjustments after patient has procedure today:  1. Increase Levemir 35 units QHS (patient takes 45 units daily at home)  2. Start Novolog Meal Coverage: Novolog 6 units TID with meals (hold if pt eats <50% of meal)     --Will follow patient during hospitalization--  Wyn Quaker RN, MSN, CDE Diabetes Coordinator Inpatient Glycemic Control Team Team Pager: [phone] (8a-5p)

## 2016-04-24 LAB — CBC
HCT: 32.1 % — ABNORMAL LOW (ref 36.0–46.0)
Hemoglobin: 9.9 g/dL — ABNORMAL LOW (ref 12.0–15.0)
MCH: 28 pg (ref 26.0–34.0)
MCHC: 30.8 g/dL (ref 30.0–36.0)
MCV: 90.9 fL (ref 78.0–100.0)
Platelets: 177 10*3/uL (ref 150–400)
RBC: 3.53 MIL/uL — ABNORMAL LOW (ref 3.87–5.11)
RDW: 20.2 % — ABNORMAL HIGH (ref 11.5–15.5)
WBC: 7.5 10*3/uL (ref 4.0–10.5)

## 2016-04-24 LAB — BASIC METABOLIC PANEL
Anion gap: 9 (ref 5–15)
BUN: 26 mg/dL — ABNORMAL HIGH (ref 6–20)
CO2: 27 mmol/L (ref 22–32)
Calcium: 9.7 mg/dL (ref 8.9–10.3)
Chloride: 96 mmol/L — ABNORMAL LOW (ref 101–111)
Creatinine, Ser: 1.18 mg/dL — ABNORMAL HIGH (ref 0.44–1.00)
GFR calc Af Amer: 54 mL/min — ABNORMAL LOW (ref >60)
GFR calc non Af Amer: 47 mL/min — ABNORMAL LOW (ref >60)
Glucose, Bld: 225 mg/dL — ABNORMAL HIGH (ref 65–99)
Potassium: 4.2 mmol/L (ref 3.5–5.1)
Sodium: 132 mmol/L — ABNORMAL LOW (ref 135–145)

## 2016-04-24 LAB — GLUCOSE, CAPILLARY
Glucose-Capillary: 198 mg/dL — ABNORMAL HIGH (ref 65–99)
Glucose-Capillary: 226 mg/dL — ABNORMAL HIGH (ref 65–99)
Glucose-Capillary: 263 mg/dL — ABNORMAL HIGH (ref 65–99)
Glucose-Capillary: 221 mg/dL — ABNORMAL HIGH (ref 65–99)

## 2016-04-24 MED ORDER — FUROSEMIDE 80 MG PO TABS
80.0000 mg | ORAL_TABLET | Freq: Every day | ORAL | Status: DC
  Administered 2016-04-25: 80 mg via ORAL
  Filled 2016-04-24: qty 1

## 2016-04-24 NOTE — Progress Notes (Signed)
Subjective:  No complaints this morning. Very sleepy and waiting for breakfast and coffee. Denies chest pain or dyspnea, breathing comfortably on room air and in good spirits. Clear for dc home this afternoon.  Net -3.5L and - 2 lbs since yesterday. Telemetry reviewed, PVCs and few episodes bradycardia to 40s  Objective:  Vital signs in last 24 hours: Vitals:   04/23/16 1500 04/23/16 1600 04/23/16 2112 04/24/16 0648  BP: (!) 144/43 (!) 143/51 138/77 118/77  Pulse: 69 (!) 59 (!) 55 (!) 59  Resp:  _0 Temp:   98.1 F (36.7 C) 97.8 F (36.6 C)  TempSrc:   Oral Oral  SpO2: 98% 97% 96% 92%  Weight:    188 lb 6.4 oz (85.5 kg)  Height:        Intake/Output Summary (Last 24 hours) at 04/24/16 0723 Last data filed at 04/24/16 0000  Gross per 24 hour  Intake              806 ml  Output             4400 ml  Net            -3594 ml   Filed Weights   04/22/16 0526 04/23/16 0522 04/24/16 0648  Weight: 193 lb 4.8 oz (87.7 kg) 190 lb 4.8 oz (86.3 kg) 188 lb 6.4 oz (85.5 kg)   General: Elderly woman resting comfortably in bed, sleepy, falling asleep during conversation HEENT: [place]/AT, moist mucus membranes CV: Slow rate and regular rhythm, no m/g/r Pulm/Chest: CTA bil, normal work of breathing Ext: No edema. Warm and well-perfused Skin: Warm, dry, intact  3/13 - PFTs FEV1 pr pre 45%, FEV1/FVC pre 75%, post 69%, TLC 76% pred, DLCO 52% pred   3/14 - RHC  1. Elevated right and left heart filling pressures.  2. Moderate to severe pulmonary hypertension.  This appears to be mixed pulmonary venous/pulmonary arterial hypertension.  Suspect group 2 + group 3 PH PA 77/28, mean 47 PCWP mean 23   Assessment/Plan:  Pulmonary hypertension, and right-sided heart failure, TTE on 3/12 showed LVEF 60-65%, RV dilated and hypokinetic with severely increased pulmonary artery pressure (estimated 76 mmHg). PAH likely multifactorial from COPD/OSA (group 3), and some left sided HF as LVEDP was  elevated to 20 mm Hg (group 2). Right heart cath today revealed moderated to severe pulmonary HTN and elevated R and L heart filling pressures. VQ inconsistent with chronic PE. PFTs showed severe COPD, gold stage 3.  -- Appreciated HF team recommendations -- Will DC today on Lasix 80 mg daily, KCL 20 mEq daily -- Metoprolol XL 25 mg QD  NSTEMI: Troponins peaked at 4.98 on 3/10 and trended back down. On heparin gtt over the weekend and has been without chest pain. Underwent LHC today 3/12 which showed minimal CAD and no culprit lesion. Likely has ischemic event of small coronary vessel not seen on cath or demand ischemia in setting of hypervolemia.  - Appreciate Cardiology recs - Continue ASA, Crestor  Iron deficiency anemia: Recent hospitalization for GI bleed secondary to AVM. Discontinued anticoag. Hb stable 8-10 - Follow CBC - Continue home iron   GERD, resolved today, was having persistent reflux symptoms and chest wall pain that did not seem to respond to Pepcid or Protonix -- GI cocktail TID PRN -- Voltaren gel QID PRN -- Pepcid 20 mg BID PRN -- Zofran and Phenergan PRN for nausea  PAF and s/p mitral valve repair, bioprosthetic MVR: Coumadin  discontinued after GI bleed 03/2016. Has porcine valve. CHADSVASC score 6. Valve functioning normally on TTE 3/12. -- Telemetry largely NSR -- Metoprolol XL 25 mg daily -- Continue home ASA  -- Consider restarting Warfarin outpatient with lowered INR goal (1.5-2.5) to avoid repeat GI bleeding  Type 2 DM: CBGs uncontrolled yesterday in high 200s, increase levemir from 20 to 30 QHS, was NPO much of today, now on liquid diet. Last A1c 7.6 in 12/2015 - Continue Levemir 30 units QHS - SSI-M  TID AC HS  COPD, gold stage 3 /OSA on CPAP: Stable - Continue home Advair and Spiriva - CPAP QHS  Depression/Anxiety: - Continue home Fluoxetine and Alprazolam   Chronic Pain/Fibromyalgia:  - Continue home Norco, Flexeril  Tobacco Abuse: Smokes 1 pack  per day. - Nicotine patch  Diet: Heart healthy  DVT PPx: Heparin TID  Dispo: Anticipated discharge today.  Asencion Partridge, MD 04/24/2016, 7:23 AM Pager: [phone]

## 2016-04-24 NOTE — Progress Notes (Signed)
Patient is very sleepy,easily arousable. V/s checked O2Sats  88% on room air.Placed on O2 via [place] @ 3L O2 Sats 99%. Will monitor accordingly.

## 2016-04-24 NOTE — Progress Notes (Signed)
Patient ID: [name], female   DOB: September 12, 1950, 66 y.o.   MRN: [id number]   SUBJECTIVE: Patient diuresed well overnight, weight down.  Creatinine fairly stable.  Says breathing is "ok."    V/Q scan on 3/13 with no evidence for chronic PEs.   Echo 04/21/16 LVEF 60-65%, RV severely dilated and systolic function mildly reduced.  PA peak pressure 76 mm Hg.   By comparison, PA peak pressure 33 mm Hg by Echo 08/2012 with normal RV  LHC 04/21/16 Left main - Ost LM Lesion, 30% stenosed  RCA - Ost RCA lesion, 40% stenosed.  AO 126/56 mm Hg LVEDP 20 mm Hg  RHC (3/14): Hemodynamics (mmHg) RA mean 14 RV 77/16 PA 77/28, mean 47 PCWP mean 23 Oxygen saturations: PA 53% AO 93% Cardiac Output (Fick) 4.86  Cardiac Index (Fick) 2.6 PVR 4.9 WU  PFTs (3/14): FEV1 0.99L (45%), severe airways obstruction, mild restriction, severely decreased DLCO.   Scheduled Meds: . aspirin  81 mg Oral Daily  . ferrous gluconate  324 mg Oral BID WC  . FLUoxetine  40 mg Oral Daily  . fluticasone  2 spray Each Nare QHS  . furosemide  40 mg Intravenous Q8H  . gabapentin  600 mg Oral TID  . heparin  5,000 Units Subcutaneous Q8H  . insulin aspart  0-15 Units Subcutaneous TID WC  . insulin aspart  0-5 Units Subcutaneous QHS  . insulin detemir  30 Units Subcutaneous QHS  . metoprolol succinate  25 mg Oral Daily  . mometasone-formoterol  2 puff Inhalation BID  . nicotine  21 mg Transdermal Daily  . pantoprazole  80 mg Oral Daily  . rosuvastatin  20 mg Oral QHS  . sodium chloride flush  3 mL Intravenous Q12H  . sodium chloride flush  3 mL Intravenous Q12H  . tiotropium  18 mcg Inhalation Daily   Continuous Infusions: PRN Meds:.sodium chloride, sodium chloride, acetaminophen, albuterol, ALPRAZolam, cyclobenzaprine, diclofenac sodium, famotidine, gi cocktail, HYDROcodone-acetaminophen, lidocaine, ondansetron (ZOFRAN) IV, promethazine, sodium chloride flush, sodium chloride flush, technetium TC 74M  diethylenetriame-pentaacetic acid    Vitals:   04/23/16 1500 04/23/16 1600 04/23/16 2112 04/24/16 0648  BP: (!) 144/43 (!) 143/51 138/77 118/77  Pulse: 69 (!) 59 (!) 55 (!) 59  Resp:  _0 Temp:   98.1 F (36.7 C) 97.8 F (36.6 C)  TempSrc:   Oral Oral  SpO2: 98% 97% 96% 92%  Weight:    188 lb 6.4 oz (85.5 kg)  Height:        Intake/Output Summary (Last 24 hours) at 04/24/16 0856 Last data filed at 04/24/16 0805  Gross per 24 hour  Intake              809 ml  Output             4300 ml  Net            -3491 ml    LABS: Basic Metabolic Panel:  Recent Labs  04/23/16 0505 04/24/16 0505  NA 135 132*  K 3.8 4.2  CL 100* 96*  CO2 27 27  GLUCOSE 230* 225*  BUN 21* 26*  CREATININE 1.04* 1.18*  CALCIUM 9.3 9.7   Liver Function Tests: No results for input(s): AST, ALT, ALKPHOS, BILITOT, PROT, ALBUMIN in the last 72 hours. No results for input(s): LIPASE, AMYLASE in the last 72 hours. CBC:  Recent Labs  04/23/16 0505 04/24/16 0505  WBC 6.8 7.5  HGB 9.4* 9.9*  HCT 30.8* 32.1*  MCV 91.9 90.9  PLT 166 177   Cardiac Enzymes: No results for input(s): CKTOTAL, CKMB, CKMBINDEX, TROPONINI in the last 72 hours. BNP: Invalid input(s): POCBNP D-Dimer: No results for input(s): DDIMER in the last 72 hours. Hemoglobin A1C: No results for input(s): HGBA1C in the last 72 hours. Fasting Lipid Panel: No results for input(s): CHOL, HDL, LDLCALC, TRIG, CHOLHDL, LDLDIRECT in the last 72 hours. Thyroid Function Tests: No results for input(s): TSH, T4TOTAL, T3FREE, THYROIDAB in the last 72 hours.  Invalid input(s): FREET3 Anemia Panel: No results for input(s): VITAMINB12, FOLATE, FERRITIN, TIBC, IRON, RETICCTPCT in the last 72 hours.  RADIOLOGY: Dg Chest 2 View  Result Date: 04/22/2016 CLINICAL DATA:  Chest pain and dry cough EXAM: CHEST  2 VIEW COMPARISON:  04/18/2016 FINDINGS: Cardiac shadow is within normal limits. Postsurgical changes are again seen. Aortic  calcifications are again noted. Multiple right rib fractures are again seen with healing. Chronic blunting of left costophrenic angle is noted. No focal infiltrate or sizable effusion is seen. IMPRESSION: No acute abnormality noted. Electronically Signed   By: Inez Catalina M.D.   On: 04/22/2016 17:49   Dg Chest 2 View  Result Date: 04/18/2016 CLINICAL DATA:  Mid chest pain x2 hours for chest swelling. EXAM: CHEST  2 VIEW COMPARISON:  06/27/2015 FINDINGS: Stable cardiomegaly. The aorta is atherosclerotic without aneurysm. Mild interstitial edema without pneumonic consolidation, effusion or pneumothorax. The patient is status post median sternotomy. Old right-sided rib fractures are noted with healing. Plate and screw fixation of the mid clavicle. Vascular clips are seen at along the right side of the neck. IMPRESSION: Mild interstitial edema. Aortic atherosclerosis. Multiple chronic right-sided rib fractures. Plate and screw fixation of the mid right clavicle. Electronically Signed   By: Ashley Royalty M.D.   On: 04/18/2016 22:09   Ct Head Wo Contrast  Result Date: 04/18/2016 CLINICAL DATA:  Headache and left upper extremity pain for 2 weeks. EXAM: CT HEAD WITHOUT CONTRAST TECHNIQUE: Contiguous axial images were obtained from the base of the skull through the vertex without intravenous contrast. COMPARISON:  01/07/2013 FINDINGS: Brain: There is no intracranial hemorrhage, mass or evidence of acute infarction. There is no extra-axial fluid collection. Gray matter and white matter appear normal. Cerebral volume is normal for age. Brainstem and posterior fossa are unremarkable. The CSF spaces appear normal. Vascular: No hyperdense vessel or unexpected calcification. Skull: Normal. Negative for fracture or focal lesion. Sinuses/Orbits: No acute finding. Other: None. IMPRESSION: Normal brain Electronically Signed   By: Andreas Newport M.D.   On: 04/18/2016 22:11   Nm Pulmonary Perf And Vent  Result Date:  04/22/2016 CLINICAL DATA:  History of COPD, chest pain EXAM: NUCLEAR MEDICINE VENTILATION - PERFUSION LUNG SCAN TECHNIQUE: Ventilation images were obtained in multiple projections using inhaled aerosol Tc-31mDTPA. Perfusion images were obtained in multiple projections after intravenous injection of Tc-945mAA. RADIOPHARMACEUTICALS:  33 mCi Technetium-99106mPA aerosol inhalation and 4.15 mCi Technetium-69m26m IV COMPARISON:  Radiograph 04/22/2016 FINDINGS: Ventilation: Heterogeneous ventilation, consistent with obstructive airways disease. Perfusion: Mild heterogeneous perfusion, but no large wedge-shaped peripheral defects. Heterogenous perfusion is in the general distribution of the heterogeneous lung ventilation. IMPRESSION: 1. Overall low probability study for pulmonary embolus given heterogenous perfusion. 2. Heterogenous ventilation, consistent with obstructive airways disease. Electronically Signed   By: Kim Donavan Foil.   On: 04/22/2016 20:25   Ct Abdomen W Wo Contrast  Result Date: 03/28/2016 CLINICAL DATA:  Acute onset of generalized abdominal pain.  Acute blood loss. Initial encounter. EXAM: CT ABDOMEN WITHOUT AND WITH CONTRAST TECHNIQUE: Multidetector CT imaging of the abdomen was performed following the standard protocol before and following the bolus administration of intravenous contrast. CONTRAST:  100 mL of Isovue 300 IV contrast COMPARISON:  CT of the abdomen performed 08/23/2015, and MRI of the lumbar spine performed 11/15/2015 FINDINGS: Lower chest: A bleb is noted at the right lung base. Scattered calcification is noted at the aortic and mitral valves, with postoperative change at the mitral valve. Scattered coronary artery calcifications are seen. Hepatobiliary: The liver is unremarkable in appearance. The gallbladder is unremarkable in appearance. The common bile duct remains normal in caliber. Trace fluid is seen about the liver. Pancreas: The pancreas is within normal limits. Spleen:  The spleen is unremarkable in appearance. Adrenals/Urinary Tract: The adrenal glands are unremarkable in appearance. Post ablation changes at the posterior aspect of the lower pole of the left kidney are grossly unchanged from 2017. There is no evidence of recurrent mass, with a mixture fatty attenuation and soft tissue attenuation. Mild delayed enhancement at the small 1.2 cm soft tissue component along the posterior aspect of the ablation area is stable in appearance. A 6 mm nonobstructing stone is noted at the interpole region of the right kidney. Mild nonspecific perinephric stranding is noted bilaterally. There is no evidence of hydronephrosis. No obstructing ureteral stones are identified. No perinephric stranding is seen. Stomach/Bowel: Visualized small and large bowel loops are grossly unremarkable in appearance. The stomach is largely decompressed and unremarkable. Vascular/Lymphatic: Diffuse calcification is seen along the abdominal aorta and its branches. The abdominal aorta is otherwise grossly unremarkable. The inferior vena cava is grossly unremarkable. No retroperitoneal lymphadenopathy is seen. Other: There is question of mild soft tissue inflammation at the right lower quadrant. Would correlate clinically for any evidence of appendicitis, as the appendix is not imaged on this study. Musculoskeletal: No acute osseous abnormalities are identified. The visualized musculature is unremarkable in appearance. IMPRESSION: 1. Stable appearance to post ablation change at the lower pole of the left kidney, with a small 1.2 cm focus of enhancing soft tissue again noted along the posterior aspect of the region, thought to reflect enhancing scar tissue. No evidence of local recurrence. 2. Question of mild soft tissue inflammation at the right lower quadrant, incompletely imaged on this study. Would correlate clinically for any evidence of appendicitis, as the appendix is not imaged on this study. 3. Diffuse  aortic atherosclerosis. 4. 6 mm nonobstructing stone at the interpole region of the right kidney. 5. Bleb at the right lung base. Scattered calcification at the aortic and mitral valves. Scattered coronary artery calcifications seen. Electronically Signed   By: Garald Balding M.D.   On: 03/28/2016 01:53   Ir Radiologist Eval & Mgmt  Result Date: 04/18/2016 Please refer to notes tab for details about interventional procedure. (Op Note)   PHYSICAL EXAM General: NAD Neck: Thick, JVP difficult ?8 cm, no thyromegaly or thyroid nodule.  Lungs: Distant breath sounds bilaterally.  CV: Nondisplaced PMI.  Heart regular S1/S2, no S3/S4, no murmur.  1+ ankle edema.   Abdomen: Soft, nontender, no hepatosplenomegaly, no distention.  Neurologic: Alert and oriented x 3.  Psych: Normal affect. Extremities: No clubbing or cyanosis.   TELEMETRY: Reviewed telemetry pt in NSR  ASSESSMENT AND PLAN: IQRA ROTUNDO is a 66 y.o. female with bioprosthetic MVR, PAF, DM2, and Tobacco abuse. Admitted for chest pain and NTSEMI. No "culprit lesion" on LHC. AHF team consulted  to see with PA peak pressure 76 mm Hg on Echo.  1. Pulmonary hypertension with RV failure: Severe PH by echo with RV dysfunction (EF 60-65%, mild LVH, normally functioning bioprosthetic aortic valve, severely dilated RV mildly decreased systolic function, PASP 76 mmHg).  She has multiple reasons for pulmonary hypertension: OSA, COPD, prior mitral valve disease with possible pulmonary vascular remodeling and PH despite valve replacement, and diastolic CHF (group 2 PH). V/Q scan showed no chronic PE.  RHC 3/14 showed moderate to severe PH, mixed pulmonary venous and pulmonary arterial hypertension.  Right and left heart filling pressures elevated, suggestive of diastolic CHF as well as RV failure.  PFTs (3/14) showed evidence for severe COPD.  She diuresed well overnight on IV Lasix.  - I think she is stable for home at this point, would send out on Lasix  80 mg po daily + KCl 20 mEq daily.  - PH is mixed pulmonary venous and arterial with PVR 4.9 WU.  The pulmonary arterial hypertension is likely primarily group 3 PH due to underlying lung disease (COPD, OSA).  PFTs yesterday suggested severe COPD.  I suspect there is not a significant group 1 PH component here and that she would not be likely to benefit from selective pulmonary vasodilators.  2. Chest pain and elevated troponin: ?Demand ischemia from volume overload/RV failure.  Coronary angiography showed nonobstructive CAD.  She is on ASA 81 and statin.  3. S/p MV replacement: Bioprosthetic mitral valve.  The valve looked ok on echo this admission.  She had mitral stenosis as the lesion triggering the valve repair.    4. COPD: Severe obstruction on yesterday's PFTs.  She has home oxygen but rarely uses it.  She continues to smoke.  - I strongly encouraged smoking cessation.  5. OSA: Continue CPAP.  6. Anemia: Fe deficiency and chronic disease.  Chronic slow GI bleed suspected from small bowel AVMs.  After last bleeding episode in the hospital in 2/18, warfarin was stopped. With bioprosthetic mitral valve for mitral stenosis, warfarin is the only anticoagulant indicated.  I would keep her off warfarin for now with fairly recent GI bleeding, but would consider restarting her on warfarin at followup appt with lower INR goal 1.5-2.5.   7. Atrial fibrillation: Paroxysmal, s/p MAZE with mitral valve replacement.  Currently NSR.  She is off warfarin due to GI bleeding. 8. From my standpoint, patient is stable for home today.  I will arrange followup in 2 wks in CHF clinic.  Cardiac meds for home: Lasix 80 mg daily, KCl 20 daily, Toprol XL 25 daily, ASA 81 daily, Crestor 20 daily, nicotine patch.   Loralie Champagne 04/24/2016 8:56 AM

## 2016-04-24 NOTE — Progress Notes (Signed)
Inpatient Diabetes Program Recommendations  AACE/ADA: New Consensus Statement on Inpatient Glycemic Control (2015)  Target Ranges:  Prepandial:   less than 140 mg/dL      Peak postprandial:   less than 180 mg/dL (1-2 hours)      Critically ill patients:  140 - 180 mg/dL   Results for RON, JUNCO (MRN [id number]) as of 04/24/2016 11:06  Ref. Range 04/23/2016 07:56 04/23/2016 11:35 04/23/2016 16:42 04/23/2016 21:15 04/24/2016 07:58  Glucose-Capillary Latest Ref Range: 65 - 99 mg/dL 171 (H) 170 (H) 236 (H) 319 (H) 198 (H)    Review of Glycemic Control  Current orders for Inpatient glycemic control: Levemir 30 units QHS, Novolog 0-15 units TID with meals, Novolog 0-5 units QHS  Inpatient Diabetes Program Recommendations: Insulin - Basal: Please consider increasing Levemir to 32 units QHS. Insulin - Meal Coverage: Please consider ordering Novolog 4 units TID with meals for meal coverage if patient eats at least 50% of meal.  Thanks, Barnie Alderman, RN, MSN, CDE Diabetes Coordinator Inpatient Diabetes Program [phone] (Team Pager from 8am to 5pm)

## 2016-04-24 NOTE — Evaluation (Signed)
Physical Therapy Evaluation Patient Details Name: [name] MRN: [id number] DOB: Nov 16, 1950 Today's Date: 04/24/2016   History of Present Illness  Pt adm with Pulmonary hypertension, and right-sided heart failure and NSTEMI. PMH - DM, COPD, depression/anxiety, neuropathy, fibromyalgia  Clinical Impression  Pt admitted with above diagnosis and presents to PT with functional limitations due to deficits listed below (See PT problem list). Pt needs skilled PT to maximize independence and safety to allow discharge to ST-SNF. Pt very sleepy and only able to maintain eye opening and arousal when actually walking. Pt falling asleep sitting on commode. Pt with very poor activity tolerance and fatigued very quickly with amb and requiring increased assist with amb. Pt with decr SpO2 (84%) on RA with c/o's dizziness and weakness. Even with O2 while amb and good SpO2 (96%) pt fatigues quickly and legs trembling with minimal amb. Pt lives alone and has very limited assistance available. Therefore recommend ST-SNF for further rehab.     Follow Up Recommendations SNF    Equipment Recommendations  None recommended by PT    Recommendations for Other Services       Precautions / Restrictions Precautions Precautions: Fall Restrictions Weight Bearing Restrictions: No      Mobility  Bed Mobility Overal bed mobility: Modified Independent             General bed mobility comments: Incr time  Transfers Overall transfer level: Needs assistance Equipment used: 4-wheeled walker Transfers: Sit to/from Stand Sit to Stand: Min guard         General transfer comment: Assist for safety and balance  Ambulation/Gait Ambulation/Gait assistance: Min assist;Supervision Ambulation Distance (Feet): 100 Feet (100' x 1, 50' x 1) Assistive device: 4-wheeled walker Gait Pattern/deviations: Step-through pattern;Decreased step length - right;Decreased step length - left;Shuffle;Drifts  right/left;Staggering left Gait velocity: decr Gait velocity interpretation: Below normal speed for age/gender General Gait Details: Assist for balance. Initially pt supervision while using rollator. Pt fatigued quickly and became dizzy and SpO2 84% on RA. Sat and rested with SpO2 rising to 93% on 2L. Began amb again with pt c/o of significant weakness and legs beginning to tremble. Returned to sitting EOB with SpO2 96% on 2L but pt with continued weakness.   Stairs            Wheelchair Mobility    Modified Rankin (Stroke Patients Only)       Balance Overall balance assessment: Needs assistance Sitting-balance support: No upper extremity supported;Feet supported Sitting balance-Leahy Scale: Good     Standing balance support: No upper extremity supported;During functional activity Standing balance-Leahy Scale: Fair                               Pertinent Vitals/Pain Pain Assessment: No/denies pain    Home Living Family/patient expects to be discharged to:: Private residence Living Arrangements: Alone Available Help at Discharge: Family (very limited assist) Type of Home: House Home Access: Stairs to enter Entrance Stairs-Rails: None Entrance Stairs-Number of Steps: 1 Home Layout: One level Home Equipment: Walker - 2 wheels;Walker - 4 wheels;Cane - single point;Shower seat;Grab bars - toilet;Grab bars - tub/shower;Bedside commode Additional Comments: home O2 at night    Prior Function Level of Independence: Independent with assistive device(s)         Comments: Amb and uses rollator at times. Does not drive. Sister does her shopping.     Hand Dominance   Dominant Hand: Right  Extremity/Trunk Assessment   Upper Extremity Assessment Upper Extremity Assessment: Generalized weakness    Lower Extremity Assessment Lower Extremity Assessment: Generalized weakness       Communication   Communication: No difficulties  Cognition  Arousal/Alertness: Lethargic Behavior During Therapy: Flat affect Overall Cognitive Status: Impaired/Different from baseline Area of Impairment: Problem solving             Problem Solving: Slow processing;Decreased initiation General Comments: Pt falling asleep except when she was moving    General Comments      Exercises     Assessment/Plan    PT Assessment Patient needs continued PT services  PT Problem List Decreased strength;Decreased activity tolerance;Decreased balance;Decreased mobility;Obesity       PT Treatment Interventions DME instruction;Gait training;Functional mobility training;Therapeutic activities;Therapeutic exercise;Balance training;Patient/family education    PT Goals (Current goals can be found in the Care Plan section)  Acute Rehab PT Goals Patient Stated Goal: Return home PT Goal Formulation: With patient Time For Goal Achievement: 05/01/16 Potential to Achieve Goals: Good    Frequency Min 3X/week   Barriers to discharge Decreased caregiver support Pt lives at home with minimal support    Co-evaluation               End of Session Equipment Utilized During Treatment: Oxygen Activity Tolerance: Patient limited by fatigue Patient left: in bed;with call bell/phone within reach Nurse Communication: Mobility status PT Visit Diagnosis: Unsteadiness on feet (R26.81);Muscle weakness (generalized) (M62.81)         Time: 0762-2633 PT Time Calculation (min) (ACUTE ONLY): 26 min   Charges:   PT Evaluation $PT Eval Moderate Complexity: 1 Procedure PT Treatments $Gait Training: 8-22 mins   PT G CodesShary Decamp [facility] 2016/05/07, 3:53 PM [employer] PT [phone]

## 2016-04-24 NOTE — Progress Notes (Signed)
Responded to [place] top pray with patient.  Prayed for patient.  Patient was sitting on side of bed when I entered room and pt. appeared to be a little drowz.  When I spoke with patient nurse she indicated that it was due to medication. Chaplain available as needed.   04/24/16 0934  Clinical Encounter Type  Visited With Patient;Health care provider  Visit Type Initial;Spiritual support  Referral From Other (Comment)  Consult/Referral To ([place])  Spiritual Encounters  Spiritual Needs Prayer;Emotional  Jaclynn Major, [place]

## 2016-04-24 NOTE — Progress Notes (Signed)
Patient did not sleep well last night, no other complaints or concerns, vital signs stable.

## 2016-04-24 NOTE — Progress Notes (Signed)
SATURATION QUALIFICATIONS: (This note is used to comply with regulatory documentation for home oxygen)  Patient Saturations on Room Air at Rest = 87%  Patient Saturations on Room Air while Ambulating = 84%  Patient Saturations on 2 Liters of oxygen while Ambulating = 96%  Please briefly explain why patient needs home oxygen: Unable to maintain adequate oxygenation without supplemental O2.  [employer] PT [phone]

## 2016-04-25 ENCOUNTER — Other Ambulatory Visit: Payer: Self-pay

## 2016-04-25 DIAGNOSIS — J438 Other emphysema: Secondary | ICD-10-CM | POA: Diagnosis not present

## 2016-04-25 DIAGNOSIS — J449 Chronic obstructive pulmonary disease, unspecified: Secondary | ICD-10-CM | POA: Diagnosis not present

## 2016-04-25 DIAGNOSIS — D509 Iron deficiency anemia, unspecified: Secondary | ICD-10-CM | POA: Diagnosis not present

## 2016-04-25 DIAGNOSIS — Z9989 Dependence on other enabling machines and devices: Secondary | ICD-10-CM

## 2016-04-25 DIAGNOSIS — I270 Primary pulmonary hypertension: Secondary | ICD-10-CM | POA: Diagnosis not present

## 2016-04-25 DIAGNOSIS — I50810 Right heart failure, unspecified: Secondary | ICD-10-CM

## 2016-04-25 DIAGNOSIS — D638 Anemia in other chronic diseases classified elsewhere: Secondary | ICD-10-CM | POA: Diagnosis not present

## 2016-04-25 DIAGNOSIS — Z79899 Other long term (current) drug therapy: Secondary | ICD-10-CM | POA: Diagnosis not present

## 2016-04-25 DIAGNOSIS — K922 Gastrointestinal hemorrhage, unspecified: Secondary | ICD-10-CM | POA: Diagnosis not present

## 2016-04-25 DIAGNOSIS — R2681 Unsteadiness on feet: Secondary | ICD-10-CM | POA: Diagnosis not present

## 2016-04-25 DIAGNOSIS — F17210 Nicotine dependence, cigarettes, uncomplicated: Secondary | ICD-10-CM | POA: Diagnosis not present

## 2016-04-25 DIAGNOSIS — I272 Pulmonary hypertension, unspecified: Secondary | ICD-10-CM | POA: Diagnosis not present

## 2016-04-25 DIAGNOSIS — N390 Urinary tract infection, site not specified: Secondary | ICD-10-CM | POA: Diagnosis not present

## 2016-04-25 DIAGNOSIS — Z794 Long term (current) use of insulin: Secondary | ICD-10-CM | POA: Diagnosis not present

## 2016-04-25 DIAGNOSIS — I10 Essential (primary) hypertension: Secondary | ICD-10-CM | POA: Diagnosis not present

## 2016-04-25 DIAGNOSIS — B373 Candidiasis of vulva and vagina: Secondary | ICD-10-CM | POA: Diagnosis not present

## 2016-04-25 DIAGNOSIS — Z7951 Long term (current) use of inhaled steroids: Secondary | ICD-10-CM

## 2016-04-25 DIAGNOSIS — I2721 Secondary pulmonary arterial hypertension: Secondary | ICD-10-CM

## 2016-04-25 DIAGNOSIS — Z9981 Dependence on supplemental oxygen: Secondary | ICD-10-CM

## 2016-04-25 DIAGNOSIS — I509 Heart failure, unspecified: Secondary | ICD-10-CM | POA: Diagnosis not present

## 2016-04-25 DIAGNOSIS — I214 Non-ST elevation (NSTEMI) myocardial infarction: Secondary | ICD-10-CM | POA: Diagnosis not present

## 2016-04-25 DIAGNOSIS — G4733 Obstructive sleep apnea (adult) (pediatric): Secondary | ICD-10-CM | POA: Diagnosis not present

## 2016-04-25 DIAGNOSIS — R278 Other lack of coordination: Secondary | ICD-10-CM | POA: Diagnosis not present

## 2016-04-25 DIAGNOSIS — I050 Rheumatic mitral stenosis: Secondary | ICD-10-CM | POA: Diagnosis not present

## 2016-04-25 DIAGNOSIS — B372 Candidiasis of skin and nail: Secondary | ICD-10-CM | POA: Diagnosis not present

## 2016-04-25 DIAGNOSIS — I2723 Pulmonary hypertension due to lung diseases and hypoxia: Secondary | ICD-10-CM

## 2016-04-25 DIAGNOSIS — F329 Major depressive disorder, single episode, unspecified: Secondary | ICD-10-CM | POA: Diagnosis not present

## 2016-04-25 DIAGNOSIS — E1140 Type 2 diabetes mellitus with diabetic neuropathy, unspecified: Secondary | ICD-10-CM | POA: Diagnosis not present

## 2016-04-25 DIAGNOSIS — Z885 Allergy status to narcotic agent status: Secondary | ICD-10-CM

## 2016-04-25 DIAGNOSIS — I480 Paroxysmal atrial fibrillation: Secondary | ICD-10-CM | POA: Diagnosis not present

## 2016-04-25 DIAGNOSIS — I5032 Chronic diastolic (congestive) heart failure: Secondary | ICD-10-CM | POA: Diagnosis not present

## 2016-04-25 DIAGNOSIS — Z952 Presence of prosthetic heart valve: Secondary | ICD-10-CM | POA: Diagnosis not present

## 2016-04-25 DIAGNOSIS — I2510 Atherosclerotic heart disease of native coronary artery without angina pectoris: Secondary | ICD-10-CM | POA: Diagnosis not present

## 2016-04-25 DIAGNOSIS — E1169 Type 2 diabetes mellitus with other specified complication: Secondary | ICD-10-CM | POA: Diagnosis not present

## 2016-04-25 DIAGNOSIS — E1165 Type 2 diabetes mellitus with hyperglycemia: Secondary | ICD-10-CM | POA: Diagnosis not present

## 2016-04-25 DIAGNOSIS — E785 Hyperlipidemia, unspecified: Secondary | ICD-10-CM | POA: Diagnosis not present

## 2016-04-25 DIAGNOSIS — E119 Type 2 diabetes mellitus without complications: Secondary | ICD-10-CM | POA: Diagnosis not present

## 2016-04-25 DIAGNOSIS — R5381 Other malaise: Secondary | ICD-10-CM | POA: Diagnosis not present

## 2016-04-25 DIAGNOSIS — Z7982 Long term (current) use of aspirin: Secondary | ICD-10-CM

## 2016-04-25 DIAGNOSIS — Z888 Allergy status to other drugs, medicaments and biological substances status: Secondary | ICD-10-CM

## 2016-04-25 DIAGNOSIS — M6281 Muscle weakness (generalized): Secondary | ICD-10-CM | POA: Diagnosis not present

## 2016-04-25 HISTORY — DX: Chronic obstructive pulmonary disease, unspecified: J44.9

## 2016-04-25 HISTORY — DX: Right heart failure, unspecified: I50.810

## 2016-04-25 HISTORY — DX: Pulmonary hypertension due to lung diseases and hypoxia: I27.23

## 2016-04-25 LAB — GLUCOSE, CAPILLARY
Glucose-Capillary: 302 mg/dL — ABNORMAL HIGH (ref 65–99)
Glucose-Capillary: 237 mg/dL — ABNORMAL HIGH (ref 65–99)
Glucose-Capillary: 244 mg/dL — ABNORMAL HIGH (ref 65–99)

## 2016-04-25 LAB — BASIC METABOLIC PANEL
Anion gap: 10 (ref 5–15)
BUN: 25 mg/dL — ABNORMAL HIGH (ref 6–20)
CO2: 27 mmol/L (ref 22–32)
Calcium: 9.1 mg/dL (ref 8.9–10.3)
Chloride: 96 mmol/L — ABNORMAL LOW (ref 101–111)
Creatinine, Ser: 1.1 mg/dL — ABNORMAL HIGH (ref 0.44–1.00)
GFR calc Af Amer: 59 mL/min — ABNORMAL LOW (ref >60)
GFR calc non Af Amer: 51 mL/min — ABNORMAL LOW (ref >60)
Glucose, Bld: 309 mg/dL — ABNORMAL HIGH (ref 65–99)
Potassium: 3.8 mmol/L (ref 3.5–5.1)
Sodium: 133 mmol/L — ABNORMAL LOW (ref 135–145)

## 2016-04-25 MED ORDER — METOPROLOL SUCCINATE ER 25 MG PO TB24: 25.0000 mg | ORAL_TABLET | Freq: Every day | ORAL | 2 refills | Status: DC

## 2016-04-25 MED ORDER — INSULIN DETEMIR 100 UNIT/ML ~~LOC~~ SOLN
33.0000 [IU] | Freq: Every day | SUBCUTANEOUS | Status: DC
  Filled 2016-04-25: qty 0.33

## 2016-04-25 MED ORDER — INSULIN DETEMIR 100 UNIT/ML FLEXPEN: 35.0000 [IU] | PEN_INJECTOR | Freq: Every day | SUBCUTANEOUS | 3 refills | Status: DC

## 2016-04-25 MED ORDER — NICOTINE 21 MG/24HR TD PT24: 21.0000 mg | MEDICATED_PATCH | Freq: Every day | TRANSDERMAL | 0 refills | Status: DC

## 2016-04-25 MED ORDER — ASPIRIN 81 MG PO CHEW: 81.0000 mg | CHEWABLE_TABLET | Freq: Every day | ORAL | 2 refills | Status: DC

## 2016-04-25 MED ORDER — FUROSEMIDE 80 MG PO TABS: 80.0000 mg | ORAL_TABLET | Freq: Every day | ORAL | 3 refills | Status: DC

## 2016-04-25 MED ORDER — POTASSIUM CHLORIDE CRYS ER 20 MEQ PO TBCR
20.0000 meq | EXTENDED_RELEASE_TABLET | Freq: Every day | ORAL | Status: DC
  Administered 2016-04-25: 20 meq via ORAL
  Filled 2016-04-25: qty 1

## 2016-04-25 NOTE — Clinical Social Work Placement (Addendum)
   CLINICAL SOCIAL WORK PLACEMENT  NOTE  Date:  04/25/2016  Patient Details  Name: [name] MRN: [id number] Date of Birth: January 03, 1951  Clinical Social Work is seeking post-discharge placement for this patient at the [place] level of care (*CSW will initial, date and re-position this form in  chart as items are completed):  Yes   Patient/family provided with [facility] Work Department's list of facilities offering this level of care within the geographic area requested by the patient (or if unable, by the patient's family).  Yes   Patient/family informed of their freedom to choose among providers that offer the needed level of care, that participate in Medicare, Medicaid or managed care program needed by the patient, have an available bed and are willing to accept the patient.  Yes   Patient/family informed of [facility]'s ownership interest in [facility] and [facility], as well as of the fact that they are under no obligation to receive care at these facilities.  PASRR submitted to EDS on       PASRR number received on       Existing PASRR number confirmed on 04/25/16     FL2 transmitted to all facilities in geographic area requested by pt/family on 04/25/16     FL2 transmitted to all facilities within larger geographic area on       Patient informed that his/her managed care company has contracts with or will negotiate with certain facilities, including the following:            Patient/family informed of bed offers received.  04/25/2016   Patient chooses bed at      [facility]  Physician recommends and patient chooses bed at      SNF Patient to be transferred to   on  .   04/25/2016   Patient to be transferred to facility by      EMS  Patient family notified on   of transfer.  Patient notified daughter and sister  Name of family member notified:        PHYSICIAN Please sign FL2     Additional Comment:     _______________________________________________ Lilly [place], LCSW 04/25/2016, 11:17 AM

## 2016-04-25 NOTE — Discharge Summary (Signed)
Name: [name] MRN: [id number] DOB: 1951/01/05 66 y.o. PCP: Kathryn Linsey, MD  Date of Admission: 04/18/2016  8:58 PM Date of Discharge: 04/25/2016 Attending Physician: Kathryn Contes, MD  Discharge Diagnosis: 1. NSTEMI 2. Severe pulmonary artery hypertension 3. Right ventricular failure 4. COPD 5. Obstructive sleep apnea 6. Iron deficiency anemia 7. Paroxysmal atrial fibrillation 8. Type 2 diabetes mellitus  Principal Problem:   NSTEMI (non-ST elevated myocardial infarction) ([place]) Active Problems:   Hyperlipidemia   Gastroesophageal reflux disease   Type 2 diabetes mellitus with diabetic neuropathy (HCC)   Obstructive sleep apnea   Chronic congestive heart failure with left ventricular diastolic dysfunction (HCC)   Paroxysmal atrial fibrillation (HCC)   Tobacco abuse   History of gastrointestinal bleeding   S/P MVR (mitral valve replacement)   Pulmonary hypertension due to COPD [facility])   Right ventricular failure ([place])   Discharge Medications: Allergies as of 04/25/2016      Reactions   Morphine And Related Other (See Comments)   Injection site reaction   Oxycontin [oxycodone] Other (See Comments)   headache   Tramadol Hcl Swelling   Ankle swelling   Lorazepam Other (See Comments)   Patient's sister noted that ativan caused the patient to become extremely confused during hospitalization 09/2010; tolerates Xanax      Medication List    STOP taking these medications   aspirin EC 325 MG tablet Replaced by:  aspirin 81 MG chewable tablet     TAKE these medications   ACCU-CHEK FASTCLIX LANCET Kit Check your blood 4 times a day dx code 250.00 insulin requiring   albuterol 108 (90 Base) MCG/ACT inhaler Commonly known as:  PROAIR HFA Inhale 1-2 puffs into the lungs every 6 (six) hours as needed for shortness of breath.   ALPRAZolam 1 MG tablet Commonly known as:  XANAX Take 0.5-1 tablets (0.5-1 mg total) by mouth 2 (two) times daily as needed for  anxiety. What changed:  how much to take  when to take this   aspirin 81 MG chewable tablet Chew 1 tablet (81 mg total) by mouth daily. Start taking on:  04/26/2016 Replaces:  aspirin EC 325 MG tablet   chlorpheniramine 4 MG tablet Commonly known as:  CHLOR-TRIMETON Take 1 tablet (4 mg total) by mouth 3 (three) times daily as needed for allergies.   cyclobenzaprine 5 MG tablet Commonly known as:  FLEXERIL Take 1 tablet (5 mg total) by mouth 3 (three) times daily as needed for muscle spasms.   EASY TOUCH PEN NEEDLES 31G X 5 MM Misc Generic drug:  Insulin Pen Needle USE TO INJECT INSULIN 5 TIMES DAILY   ferrous gluconate 324 MG tablet Commonly known as:  FERGON Take 1 tablet (324 mg total) by mouth 2 (two) times daily with a meal. What changed:  when to take this   FLUoxetine 20 MG tablet Commonly known as:  PROZAC Take 2 tablets (40 mg total) by mouth daily.   fluticasone 50 MCG/ACT nasal spray Commonly known as:  FLONASE Place 2 sprays into both nostrils daily. What changed:  when to take this   Fluticasone-Salmeterol 500-50 MCG/DOSE Aepb Commonly known as:  ADVAIR DISKUS Inhale 1 puff into the lungs 2 (two) times daily.   furosemide 80 MG tablet Commonly known as:  LASIX Take 1 tablet (80 mg total) by mouth daily. What changed:  when to take this   gabapentin 300 MG capsule Commonly known as:  NEURONTIN Take 2 capsules (600 mg total) by mouth  3 (three) times daily.   HYDROcodone-acetaminophen 5-325 MG tablet Commonly known as:  NORCO/VICODIN Take 1-2 tablets by mouth every 6 (six) hours as needed for severe pain. What changed:  how much to take  when to take this   Insulin Detemir 100 UNIT/ML Pen Commonly known as:  LEVEMIR FLEXTOUCH Inject 35 Units into the skin daily at 10 pm. What changed:  how much to take   Insulin Syringe-Needle U-100 31G X 15/64" 0.5 ML Misc Use to inject insulin up to 4 times a day   lidocaine 2 % jelly Commonly known as:   XYLOCAINE APPLY TO AFFECTED AREA THREE TIMES A DAY What changed:  See the new instructions.   metoprolol succinate 25 MG 24 hr tablet Commonly known as:  TOPROL-XL Take 1 tablet (25 mg total) by mouth daily. Start taking on:  04/26/2016   nicotine 21 mg/24hr patch Commonly known as:  NICODERM CQ - dosed in mg/24 hours Place 1 patch (21 mg total) onto the skin daily. Start taking on:  04/26/2016   insulin aspart 100 UNIT/ML injection Commonly known as:  novoLOG Inject 18-28 Units into the skin See admin instructions. Inject 18 units plus adjustment for CBG (sliding scale) - 3 times daily before meals   NOVOLOG 100 UNIT/ML injection Generic drug:  insulin aspart USE TO FILL VGO40 TO WEAR DAILY.   nystatin cream Commonly known as:  MYCOSTATIN APPLY TO AFFECTED AREA TWICE DAILY What changed:  See the new instructions.   omeprazole 40 MG capsule Commonly known as:  PRILOSEC Take 1 capsule (40 mg total) by mouth 2 (two) times daily.   ONETOUCH VERIO test strip Generic drug:  glucose blood Check blood sugar up to 4 times a day   ONETOUCH VERIO w/Device Kit 1 each by Does not apply route 4 (four) times daily.   OXYGEN Inhale 2 L into the lungs as needed (for shortness of breath while active).   potassium chloride SA 20 MEQ tablet Commonly known as:  K-DUR,KLOR-CON TAKE 1 TABLET BY MOUTH TWICE DAILY What changed:  See the new instructions.   PRESCRIPTION MEDICATION Inhale into the lungs See admin instructions. CPAP - use whenever sleeping   promethazine 12.5 MG tablet Commonly known as:  PHENERGAN Take 1 tablet (12.5 mg total) by mouth every 8 (eight) hours as needed for nausea or vomiting.   rosuvastatin 20 MG tablet Commonly known as:  CRESTOR TAKE 1 TABLET BY MOUTH EVERY NIGHT AT BEDTIME   SPIRIVA HANDIHALER 18 MCG inhalation capsule Generic drug:  tiotropium INHALE THE CONTENTS OF 1 CAPSULE VIA HANDIHALER BY MOUTH EVERY DAY   V-GO 40 Kit FILL AND PLACE VGO40  ABOUT THE SAME TIME EACH DAY. USE 4 TO 5 CLICKS WITH MEALS AND 2 CLICKS WITH SNACKS       Disposition and follow-up:   [name] was discharged from [facility] in Stable condition.  At the hospital follow up visit please address:  1. NSTEMI - assess for chest pain  Severe pulmonary artery hypertension, with right ventricular failure - assess respiratory status, adherence with CPAP and diuretics  COPD - assess adherence with inhalers, smoking cessation  Obstructive sleep apnea - adherence with CPAP  Iron deficiency anemia - assess hemoglobin, GI bleeding, adherence with iron supplements  Paroxysmal atrial fibrillation - assess rhythm, adherence with Metoprolol, Aspirin, consider restarting Coumadin with decreased INR goal 1.5-2.5 to minimize risk of recurrent GI bleed  Type 2 diabetes mellitus - assess glycemic control  2.  Labs / imaging needed at time of follow-up: CBC, BMP, CBG, HbA1c  3.  Pending labs/ test needing follow-up: None  Follow-up Appointments: Follow-up Information    [place]. Go on 04/29/2016.   Why:  At 9:45 AM, please arrive 15 minutes early to check in. Contact information: [address] [place] [phone]       Loralie Champagne, MD Follow up on 05/09/2016.   Specialty:  Cardiology Why:  at 1030 for post hospital follow up. Please bring all of your medications to your visit. The code for parking is 5002. Enter through [address] off of [address]. Underground parking on right. Can also park in lower ED lot and enter [place].  Contact information: [place] [address] [place] Hospital Course by problem list: Principal Problem:   NSTEMI (non-ST elevated myocardial infarction) [facility]) Active Problems:   Hyperlipidemia   Gastroesophageal reflux disease   Type 2 diabetes mellitus with diabetic neuropathy (HCC)    Obstructive sleep apnea   Chronic congestive heart failure with left ventricular diastolic dysfunction (HCC)   Paroxysmal atrial fibrillation (HCC)   Tobacco abuse   History of gastrointestinal bleeding   S/P MVR (mitral valve replacement)   Pulmonary hypertension due to COPD ([place])   Right ventricular failure ([place])   1. NSTEMI Ms. Elkholyis a 66 y.o.female with PMH significant for fibromyalgia, PAF on Aspirin, CHF, DM2, recent GI bleed, RCC who presented to the ED on 3/10 with generalized crampy pain and some chest pain for 1 day. The pain was atypical and radiated with tingling down her arms and legs. Occurred at rest, unaffected by exertion. EKG with no significant changes. Her troponins quickly rose to 2.9 and she was started on a heparin drip. Troponins peaked at 4.98 on 3/10 before trending down. Her chest pain and symptoms had resolved by the morning after presentation. Her heparin drip was continued until 3/12 when she was taken for left heart cath and coronary angiography. LHC revealed minimal stenosis of ostial RCA and LM, overall minimal CAD and no culprit lesion. TTE revealed preserved LVEF but worsened PAH and RV dysfunction, normal appearing bioprosthetic mitral valve. Aspirin and Rosuvastatin were continued throughout hospitalization and Metoprolol was started, succinate 25 mg daily. Her chest pain did not recur during the remainder of her hospitalization and her NSTEMI was ascribed to demand ischemia in setting of RV dysfunction or anemia vs small vessel occlusion not seen on cath. Patient was deemed stable for discharge to SNF on 3/16.  2. Severe pulmonary artery hypertension, with right ventricular failure - during evaluation for NSTEMI, TTE on 3/12 revealed severely increased pulmonary artery pressure to 76 mmHg and a dilated and hypokinetic right ventricle, significantly worsened from previous known PAH, last PA pressure 33 in 08/2012. LHC on 3/12 revealed elevated LVEDP of 20 mmHg.  Her worsening pulmonary hypertension was ascribed to her known COPD (group 3) and some component of persistent hypervolemia (group 2) given her elevated LVEDP. HF team was consulted on 3/12 and advised beginning aggressive IV diuresis while evaluating alternative causes of PAH. VQ scan on 3/13 was low risk for PE and updated PFTs on 3/13 revealed significant COPD, gold stage III. [place] 3/14 showed moderate to severe PH, mixed pulmonary venous and pulmonary arterial hypertension. Right and left heart filling pressures elevated, suggestive of diastolic CHF as well as RV failure. She diuresed  well and once creatinine started to rise resumed oral Lasix 80 mg daily (3/16) with daily potassium supplementation.   3. COPD - on 2L oxygen with exertion at home, presented with no worsening dyspnea or wheezing, adherence with daily inhalers and continues to smoke 1 pack of cigarettes daily. Scheduled Spiriva, Dulera, and prn Albuterol nebulizers were continued throughout her hospital stay and a nicotine patch was provided to help with nicotine cravings. She required PRN Albuterol very rarely during her stay. Tobacco cessation was discussed and strongly encouraged with the patient. She was found to have severe pulmonary hypertension on 3/12 via TTE that had significantly worsened from previous, PA peak pressures now up to 76 mmHg from 33 in 08/2012. Updated PFTs were obtained on 3/13 which revealed significant disease and  FEV1 45% of predicted, consistent with Gold Stage III COPD. She remained non hypoxic at rest however consistently required supplemental O2 with activity. Her home inhalers and supplementary oxygen were continued on discharge.   4. Obstructive sleep apnea - history of longstanding OSA and reported compliance with nightly CPAP at home. CPAP was continued nightly during her hospital stay without incident.   5. Iron deficiency anemia - due to recent GI bleed that required transfusion and IV iron, found to have  hemoglobin 9.9 on admission from 8.9 previously. Resumed daily iron supplements and trended CBCs daily. Hemoglobin remained stable in between 8 and 10 throughout her hospitalization.   6. Paroxysmal atrial fibrillation - history of bioprosthetic mitral valve replacement on Coumadin, CHA2DS2-VASc score of at least 5. Anticoagulation discontinued during recent hospitalization 03/2016 for GI bleed and started on Aspirin 325 mg daily, which was continued on admission. This was reduced to 81 mg daily on 3/12. Patient remained on telemetry monitoring predominently in NSR or sinus bradycardia, intermittently in rate-controlled atrial fibrillation. Started on Metoprolol XL 25 mg daily during hospitalization following NSTEMI. Aspirin 81 mg continued on discharge.   7. Type 2 diabetes mellitus - relatively well-controlled with last A1c 7.6 in Nov 2017. Prescribed Levemir 20 units QHS but taking 45 units in the morning with 18-28 units of sliding scale with meals. On admission resumed Levemir QHS and used sliding scale insulin three times daily with meals to achieve glycemic control. Some intermittent elevated CBGs to 200 to 300s during hospital stay required increasing Levemir dose, ultimately to 33 units QHS, and adding sliding scale insulin coverage at bedtime. Levemir was resumed at 35 units QHS on discharge with her previously reported sliding scale insulin.  Discharge Vitals:   BP (!) 116/31 (BP Location: Right Arm)   Pulse 83   Temp 97.7 F (36.5 C) (Oral)   Resp 18   Ht _0  (1.575 m)   Wt 188 lb 8 oz (85.5 kg)   SpO2 92%   BMI 34.48 kg/m   Pertinent Labs, Studies, and Procedures:  CBC Latest Ref Rng & Units 04/24/2016 04/23/2016 04/22/2016  WBC 4.0 - 10.5 K/uL 7.5 6.8 7.0  Hemoglobin 12.0 - 15.0 g/dL 9.9(L) 9.4(L) 8.7(L)  Hematocrit 36.0 - 46.0 % 32.1(L) 30.8(L) 29.2(L)  Platelets 150 - 400 K/uL 177 166 169   BMP Latest Ref Rng & Units 04/25/2016 04/24/2016 04/23/2016  Glucose 65 - 99 mg/dL 309(H)  225(H) 230(H)  BUN 6 - 20 mg/dL 25(H) 26(H) 21(H)  Creatinine 0.44 - 1.00 mg/dL 1.10(H) 1.18(H) 1.04(H)  BUN/Creat Ratio 12 - 28 - - -  Sodium 135 - 145 mmol/L 133(L) 132(L) 135  Potassium 3.5 - 5.1 mmol/L 3.8 4.2  3.8  Chloride 101 - 111 mmol/L 96(L) 96(L) 100(L)  CO2 22 - 32 mmol/L _0 Calcium 8.9 - 10.3 mg/dL 9.1 9.7 9.3    Recent Labs Lab 04/24/16 1127 04/24/16 1659 04/24/16 2128 04/25/16 0747 04/25/16 1132  GLUCAP 226* 263* 221* 302* 237*   Dg Chest 2 View Result Date: 04/18/2016 CLINICAL DATA:  Mid chest pain x2 hours for chest swelling. EXAM: CHEST  2 VIEW COMPARISON:  06/27/2015 FINDINGS: Stable cardiomegaly. The aorta is atherosclerotic without aneurysm. Mild interstitial edema without pneumonic consolidation, effusion or pneumothorax. The patient is status post median sternotomy. Old right-sided rib fractures are noted with healing. Plate and screw fixation of the mid clavicle. Vascular clips are seen at along the right side of the neck. IMPRESSION: Mild interstitial edema. Aortic atherosclerosis. Multiple chronic right-sided rib fractures. Plate and screw fixation of the mid right clavicle. Electronically Signed   By: Ashley Royalty M.D.   On: 04/18/2016 22:09   Ct Head Wo Contrast Result Date: 04/18/2016 CLINICAL DATA:  Headache and left upper extremity pain for 2 weeks. EXAM: CT HEAD WITHOUT CONTRAST TECHNIQUE: Contiguous axial images were obtained from the base of the skull through the vertex without intravenous contrast. COMPARISON:  01/07/2013 FINDINGS: Brain: There is no intracranial hemorrhage, mass or evidence of acute infarction. There is no extra-axial fluid collection. Gray matter and white matter appear normal. Cerebral volume is normal for age. Brainstem and posterior fossa are unremarkable. The CSF spaces appear normal. Vascular: No hyperdense vessel or unexpected calcification. Skull: Normal. Negative for fracture or focal lesion. Sinuses/Orbits: No acute finding.  Other: None. IMPRESSION: Normal brain Electronically Signed   By: Andreas Newport M.D.   On: 04/18/2016 22:11   TTE - 04/21/2016 Conclusions: - Left ventricle: Wall thickness was increased in a pattern of mild   LVH. Systolic function was normal. The estimated ejection   fraction was in the range of 60% to 65%. - Aortic valve: Mildly thickened, mildly calcified leaflets. - Mitral valve: Bioprosthetic aortic valve functioning normally.   Valve area by pressure half-time: 2.14 cm^2. - Left atrium: The atrium was mildly dilated. Volume/bsa, ES,   (1-plane Simpson&'s, A2C): 39.6 ml/m^2. - Right ventricle: The cavity size was severely dilated. Wall   thickness was normal. Systolic function was mildly reduced. - Tricuspid valve: There was moderate regurgitation. - Pulmonary arteries: Systolic pressure was severely increased. PA   peak pressure: 76 mm Hg (S). Impressions: - When compared to prior echo, RV is now dilated and hypokinetic.   Pulm HTN is now present.  LHC and coronary angio, Dr. Gwenlyn Found - 04/21/2016 IMPRESSION:Ms Bohlman has minimal CAD and normal LV function by 2-D echocardiogram. There was no "culprit lesion. I suspect this was a non-STEMI secondary to stress-induced septal subendocardial ischemia potentially from anemia. The sheath was removed and a TR band was placed on the right wrist which is patent hemostasis. The patient left the lab in stable condition. She will most likely be  discharged  in the morning. LVEDP 20 mmHg  NM pulmonary perfusion and vent - 04/22/2016 IMPRESSION: 1. Overall low probability study for pulmonary embolus given heterogenous perfusion. 2. Heterogenous ventilation, consistent with obstructive airways disease.  PFT - 04/22/2016 FVC pre 1.3 (pred pre % 46) FEV1 pre 0.99 (pre pre % 45), post 48% FEV1/FVC pre 75% TLC 3.6, % pred 76 DLCO unc pred 44%, cor pred 52%  RHC via Dr. Aundra Dubin - 04/23/2016 Conclusion  1. Elevated right and left heart filling  pressures.  2. Moderate to severe pulmonary hypertension.  This appears to be mixed pulmonary venous/pulmonary arterial hypertension.  Suspect group 2 + group 3 PH, ?component of group 1.  She will have PFTs today, then consideration of selective pulmonary vasodilators if PH appears out of proportion to severity of COPD.   Right Heart   Right Heart Pressures RHC Procedural Findings: Hemodynamics (mmHg) RA mean 14 RV 77/16 PA 77/28, mean 47 PCWP mean 23  Oxygen saturations: PA 53% AO 93%  Cardiac Output (Fick) 4.86  Cardiac Index (Fick) 2.6 PVR 4.9 WU        Discharge Instructions: Discharge Instructions    (HEART FAILURE PATIENTS) Call MD:  Anytime you have any of the following symptoms: 1) 3 pound weight gain in 24 hours or 5 pounds in 1 week 2) shortness of breath, with or without a dry hacking cough 3) swelling in the hands, feet or stomach 4) if you have to sleep on extra pillows at night in order to breathe.    Complete by:  As directed    AMB Referral to Cardiac Rehabilitation - Phase II    Complete by:  As directed    Diagnosis:  NSTEMI   Call MD for:  difficulty breathing, headache or visual disturbances    Complete by:  As directed    Diet - low sodium heart healthy    Complete by:  As directed    Discharge instructions    Complete by:  As directed    Medication changes: Please take Lasix 80 mg daily Please take Aspirin 81 mg daily instead of 325 mg Please take Metoprolol succinate 25 mg daily Levemir insulin reduced to 35 units at night Nicotine patch for smoking cessation  Please follow up in [facility] clinic and with your cardiologist.   Increase activity slowly    Complete by:  As directed       Signed: Asencion Partridge, MD 04/25/2016, 2:05 PM   Pager: [phone]

## 2016-04-25 NOTE — Progress Notes (Signed)
Report called to [place] at [facility]  Awaiting transport to facility.

## 2016-04-25 NOTE — Progress Notes (Signed)
Physical Therapy Treatment Patient Details Name: [name] MRN: [id number] DOB: 07/29/1950 Today's Date: 04/25/2016    History of Present Illness Pt adm with Pulmonary hypertension, and right-sided heart failure and NSTEMI. PMH - DM, COPD, depression/anxiety, neuropathy, fibromyalgia    PT Comments    Seen pt after lunch.  C/O feeling "tired".  Wants to take a nap.  Did agree to amb in hallway without AD (trial) noted increased unsteadiness and later drift.  Avg RA 90%.  Mild cough which increases fatigue.   Pt plans to D/C to ST Rehab prior to returning home alone.   Follow Up Recommendations  SNF  pt hoping for [place] Recommendations  None recommended by PT    Recommendations for Other Services       Precautions / Restrictions Precautions Precautions: Fall Restrictions Weight Bearing Restrictions: No    Mobility  Bed Mobility               General bed mobility comments: EOB on arrival  Transfers Overall transfer level: Needs assistance Equipment used: 1 person hand held assist Transfers: Sit to/from Stand;Stand Pivot Transfers Sit to Stand: Supervision;Min guard Stand pivot transfers: Supervision;Min guard       General transfer comment: Assist for safety and balance  Ambulation/Gait Ambulation/Gait assistance: Min guard;Min assist Ambulation Distance (Feet): 87 Feet Assistive device: 1 person hand held assist Gait Pattern/deviations: Step-to pattern;Step-through pattern Gait velocity: decreased   General Gait Details: increased unstaediness without amb with AD (trial).  Avg RA 90% and HR 76.  C/O B LE weakness with increased distance.  Noted drift R/L.     Stairs            Wheelchair Mobility    Modified Rankin (Stroke Patients Only)       Balance                                    Cognition Arousal/Alertness: Awake/alert Behavior During Therapy: WFL for tasks assessed/performed Overall  Cognitive Status: Within Functional Limits for tasks assessed                      Exercises      General Comments        Pertinent Vitals/Pain Pain Assessment: No/denies pain    Home Living                      Prior Function            PT Goals (current goals can now be found in the care plan section) Progress towards PT goals: Progressing toward goals    Frequency    Min 3X/week      PT Plan      Co-evaluation             End of Session Equipment Utilized During Treatment: Gait belt Activity Tolerance: Patient limited by fatigue Patient left: in bed;with call bell/phone within reach Nurse Communication: Mobility status PT Visit Diagnosis: Unsteadiness on feet (R26.81);Muscle weakness (generalized) (M62.81)     Time: 1068-1661 PT Time Calculation (min) (ACUTE ONLY): 11 min  Charges:  $Gait Training: 8-22 mins                    G Codes:       Rica Koyanagi  PTA [facility]  Acute  Rehab Pager  [phone]

## 2016-04-25 NOTE — Progress Notes (Signed)
Called [phone] to give report to [facility] staff for Ms. Pippen and no answer.  Will call again.

## 2016-04-25 NOTE — Plan of Care (Signed)
Problem: Health Behavior/Discharge Planning: Goal: Ability to manage health-related needs will improve Outcome: Progressing Discharge is to go to facility for short-term rehabilitation for deconditioned state

## 2016-04-25 NOTE — Clinical Social Work Note (Signed)
Clinical Social Work Assessment  Patient Details  Name: [name] MRN: [id number] Date of Birth: 08/15/1950  Date of referral:  04/25/16               Reason for consult:  Facility Placement                Permission sought to share information with:  Case Manager, Chartered certified accountant granted to share information::  No  Name::        Agency::     Relationship::     Contact Information:     Housing/Transportation Living arrangements for the past 2 months:  Single Family Home Source of Information:  Patient, Medical Team, Case Manager, Facility Patient Interpreter Needed:  None Criminal Activity/Legal Involvement Pertinent to Current Situation/Hospitalization:  No - Comment as needed Significant Relationships:  Adult Children, Other Family Members Lives with:  Self Do you feel safe going back to the place where you live?  No Need for family participation in patient care:  No (Coment)  Care giving concerns:  Patient reports she has become progressively weaker and feels that she should follow recommendation and go to SNF.  Reports she has been in the past and would like to return to [place] if possible. Patient reports she has a daughter and also a sister who can check on her, but sister's husband entered hospital and reports her daughter works full time so SNF will be best option to ensure safety and improving stability.   Social Worker assessment / plan:  LCSW completed consult for SNF. Patient agreeable to plan.  [name] has offered bed  Insurance authorization has been obtained:  [id number] from [name] at [employer] team advantage. LCSW communicated with MDs that patient is able to transfer to next venue of care if medically. Stable.  Plan will be for DC today.    Employment status:  Retired, Disabled (Comment on whether or not currently receiving Disability) Insurance information:  Programmer, applications PT Recommendations:  [place]  / Referral to community resources:  [place]  Patient/Family's Response to care:  Agreeable  Patient/Family's Understanding of and Emotional Response to Diagnosis, Current Treatment, and Prognosis:  Patient frustrated she needs to return to SNF with no clean clothes, but understands if she goes home the risk and she is realistic and agreeable for treatment recommendations.  Emotional Assessment Appearance:  Appears stated age Attitude/Demeanor/Rapport:    Affect (typically observed):  Accepting, Adaptable Orientation:  Oriented to Self, Oriented to Place, Oriented to  Time, Oriented to Situation Alcohol / Substance use:  Not Applicable Psych involvement (Current and /or in the community):  No (Comment)  Discharge Needs  Concerns to be addressed:  No discharge needs identified Readmission within the last 30 days:  No Current discharge risk:  None Barriers to Discharge:  No Barriers Identified   Kathryn Cove, LCSW 04/25/2016, 1:17 PM

## 2016-04-25 NOTE — NC FL2 (Signed)
[place] LEVEL OF CARE SCREENING TOOL     IDENTIFICATION  Patient Name: [name] Birthdate: 07-Nov-1950 Sex: female Admission Date (Current Location): 04/18/2016  [facility] and [place] Number:  Herbalist and Address:  [facility] [place] [address] [place], [place] [address]      Provider Number: [id number]  Attending Physician Name and Address:  Axel Filler, MD  Relative Name and Phone Number:       Current Level of Care: Hospital Recommended Level of Care: [place] Prior Approval Number:    Date Approved/Denied:   PASRR Number:   [id number]   Discharge Plan: SNF    Current Diagnoses: Patient Active Problem List   Diagnosis Date Noted  . History of gastrointestinal bleeding   . S/P MVR (mitral valve replacement)   . NSTEMI (non-ST elevated myocardial infarction) ([place]) 04/19/2016  . Acute blood loss anemia 03/27/2016  . Fatigue 02/18/2016  . Unexplained night sweats 12/14/2015  . Seborrheic keratosis 09/28/2015  . Asymptomatic cholelithiasis 09/25/2015  . Esophageal stenosis 08/08/2015  . Arteriovenous malformation of gastrointestinal tract 08/08/2015  . Aortic atherosclerosis ([place]) 10/19/2014  . Right nephrolithiasis 09/06/2014  . Vulvovaginal candidiasis 09/20/2013  . Chronic daily headache 01/14/2013  . Anemia of chronic disease 01/01/2013  . Chronic low back pain 10/06/2012  . Long term current use of anticoagulant therapy 09/30/2012  . Bilateral cataracts 08/04/2012  . Internal and external hemorrhoids without complication 27/07/2374  . Chronic venous insufficiency 08/04/2012  . Tobacco abuse 07/28/2012  . Allergic rhinitis 06/01/2012  . Obesity (BMI 30.0-34.9) 10/23/2011  . Clear cell renal cell carcinoma ([place]) 07/21/2011  . Adenomatous polyps 05/14/2011  . Health care maintenance 11/04/2010  . Paroxysmal atrial fibrillation ([place]) 10/22/2010  . Chronic congestive heart  failure with left ventricular diastolic dysfunction ([place]) 10/21/2010  . Fibromyalgia 08/29/2010  . Gastroesophageal reflux disease   . Type 2 diabetes mellitus with diabetic neuropathy ([place])   . Pulmonary emphysema ([place])   . Pulmonary hypertension   . Osteoporosis   . Obstructive sleep apnea   . Mitral stenosis   . Stenosis of right carotid artery   . Anxiety 07/24/2010  . Hyperlipidemia 11/20/2005  . Major depression, chronic 11/19/2005    Orientation RESPIRATION BLADDER Height & Weight     Self, Time, Situation, Place  O2 (2L) Continent Weight: 188 lb 8 oz (85.5 kg) Height:  _0  (157.5 cm)  BEHAVIORAL SYMPTOMS/MOOD NEUROLOGICAL BOWEL NUTRITION STATUS      Continent Diet (see DC summary)  AMBULATORY STATUS COMMUNICATION OF NEEDS Skin   Limited Assist Verbally Surgical wounds                       Personal Care Assistance Level of Assistance  Bathing, Feeding, Dressing Bathing Assistance: Limited assistance Feeding assistance: Limited assistance Dressing Assistance: Limited assistance     Functional Limitations Info  Sight, Hearing, Speech Sight Info: Adequate Hearing Info: Adequate Speech Info: Adequate    SPECIAL CARE FACTORS FREQUENCY  PT (By licensed PT), OT (By licensed OT)     PT Frequency: 5x OT Frequency: 5x            Contractures Contractures Info: Not present    Additional Factors Info  Code Status, Allergies Code Status Info: Full Code Allergies Info: Morphine And Related, Oxycontin Oxycodone, Tramadol Hcl, Lorazepam           Current Medications (04/25/2016):  This is  the current hospital active medication list Current Facility-Administered Medications  Medication Dose Route Frequency Provider Last Rate Last Dose  . 0.9 %  sodium chloride infusion  250 mL Intravenous PRN Lorretta Harp, MD      . 0.9 %  sodium chloride infusion  250 mL Intravenous PRN [name], MD      . acetaminophen (TYLENOL) tablet 650 mg  650 mg Oral  Q4H PRN [name], MD      . albuterol (PROVENTIL) (2.5 MG/3ML) 0.083% nebulizer solution 2.5 mg  2.5 mg Inhalation Q6H PRN Jule Ser, DO   2.5 mg at 04/19/16 0529  . ALPRAZolam [name]) tablet 1 mg  1 mg Oral BID PRN Shela Leff, MD   1 mg at 04/25/16 0030  . aspirin chewable tablet 81 mg  81 mg Oral Daily Lorretta Harp, MD   81 mg at 04/25/16 1017  . diclofenac sodium (VOLTAREN) 1 % transdermal gel 2 g  2 g Topical QID PRN Asencion Partridge, MD      . famotidine (PEPCID) tablet 20 mg  20 mg Oral BID PRN Asencion Partridge, MD   20 mg at 04/21/16 1353  . ferrous gluconate (FERGON) tablet 324 mg  324 mg Oral BID WC Axel Filler, MD   324 mg at 04/25/16 0858  . FLUoxetine (PROZAC) capsule 40 mg  40 mg Oral Daily Jule Ser, DO   40 mg at 04/25/16 1017  . fluticasone (FLONASE) 50 MCG/ACT nasal spray 2 spray  2 spray Each Nare QHS Jule Ser, DO   2 spray at 04/24/16 2148  . furosemide (LASIX) tablet 80 mg  80 mg Oral Daily Asencion Partridge, MD   80 mg at 04/25/16 1017  . gabapentin (NEURONTIN) tablet 600 mg  600 mg Oral TID Jule Ser, DO   600 mg at 04/25/16 1017  . gi cocktail (Maalox,Lidocaine,Donnatal)  30 mL Oral TID PRN Shela Leff, MD   30 mL at 04/23/16 1513  . heparin injection 5,000 Units  5,000 Units Subcutaneous Q8H [name], MD   5,000 Units at 04/25/16 0654  . HYDROcodone-acetaminophen (NORCO/VICODIN) 5-325 MG per tablet 1-2 tablet  1-2 tablet Oral Q6H PRN Jule Ser, DO   2 tablet at 04/25/16 1017  . insulin aspart (novoLOG) injection 0-15 Units  0-15 Units Subcutaneous TID WC Asencion Partridge, MD   11 Units at 04/25/16 0900  . insulin aspart (novoLOG) injection 0-5 Units  0-5 Units Subcutaneous QHS Asencion Partridge, MD   2 Units at 04/24/16 2216  . insulin detemir (LEVEMIR) injection 33 Units  33 Units Subcutaneous QHS Asencion Partridge, MD      . lidocaine (XYLOCAINE) 2 % jelly   Topical TID PRN Jule Ser, DO      . metoprolol succinate (TOPROL-XL)  24 hr tablet 25 mg  25 mg Oral Daily Asencion Partridge, MD   25 mg at 04/25/16 1017  . mometasone-formoterol (DULERA) 200-5 MCG/ACT inhaler 2 puff  2 puff Inhalation BID Jule Ser, DO   2 puff at 04/24/16 2027  . nicotine (NICODERM CQ - dosed in mg/24 hours) patch 21 mg  21 mg Transdermal Daily Juliet Rude, MD   21 mg at 04/25/16 1018  . ondansetron (ZOFRAN) injection 4 mg  4 mg Intravenous Q6H PRN [name], MD      . pantoprazole (PROTONIX) EC tablet 80 mg  80 mg Oral Daily Lorella Nimrod, MD   80 mg at 04/25/16 1016  .  potassium chloride SA (K-DUR,KLOR-CON) CR tablet 20 mEq  20 mEq Oral Daily Asencion Partridge, MD   20 mEq at 04/25/16 1025  . promethazine (PHENERGAN) tablet 12.5 mg  12.5 mg Oral Q8H PRN Jule Ser, DO      . rosuvastatin (CRESTOR) tablet 20 mg  20 mg Oral QHS Jule Ser, DO   20 mg at 04/24/16 2139  . sodium chloride flush (NS) 0.9 % injection 3 mL  3 mL Intravenous Q12H Lorretta Harp, MD   3 mL at 04/25/16 1024  . sodium chloride flush (NS) 0.9 % injection 3 mL  3 mL Intravenous PRN Lorretta Harp, MD      . sodium chloride flush (NS) 0.9 % injection 3 mL  3 mL Intravenous Q12H [name], MD   3 mL at 04/25/16 1024  . sodium chloride flush (NS) 0.9 % injection 3 mL  3 mL Intravenous PRN [name], MD      . technetium TC 17M diethylenetriame-pentaacetic acid (DTPA) injection 30 millicurie  30 millicurie Intravenous Once PRN Gus Height, MD      . tiotropium [facility]) inhalation capsule 18 mcg  18 mcg Inhalation Daily Jule Ser, DO   18 mcg at 04/24/16 1004     Discharge Medications: Please see discharge summary for a list of discharge medications.  Relevant Imaging Results:  Relevant Lab Results:   Additional Information SSN: [id number]  CPAP:  patient has her own CPAP and knows her settings.  Lilly Cove, LCSW

## 2016-04-25 NOTE — Progress Notes (Signed)
Results for PATTI, SHORB (MRN [id number]) as of 04/25/2016 12:02  Ref. Range 04/24/2016 11:27 04/24/2016 16:59 04/24/2016 21:28 04/25/2016 07:47 04/25/2016 11:32  Glucose-Capillary Latest Ref Range: 65 - 99 mg/dL 226 (H) 263 (H) 221 (H) 302 (H) 237 (H)   Noted that CBGs continue to be greater than 180 mg/dl.  Noted that Levemir has been increased to 33 units daily.  Recommend adding Novolog 4 units TID with meals if eating at least 50% of meals and CBGs continue to be elevated. Continue Novolog MODERATE correction scale TID & HS. Will continue to monitor blood sugars while in the hospital. Harvel Ricks RN BSN CDE

## 2016-04-25 NOTE — Progress Notes (Signed)
Subjective:  Deconditioned and unstable with PT yesterday, amenable to going to SNF on discharge. No complaints this morning. States she slept well overnight and wore her CPAP. Ate breakfast. Remains fatigued and plans to return to bed this morning.  Net -1.9L and - 0 lbs since yesterday. Telemetry reviewed, few PVCs and intermittent sinus brady into 40s  Objective:  Vital signs in last 24 hours: Vitals:   04/24/16 1129 04/24/16 2100 04/24/16 2336 04/25/16 0635  BP: (!) 141/41 (!) 130/43  (!) 132/43  Pulse: (!) 55 (!) 56 (!) 57 61  Resp: _0 Temp: 98 F (36.7 C) 98.3 F (36.8 C)  97.8 F (36.6 C)  TempSrc: Oral Oral  Oral  SpO2: 98% 96% 96% 95%  Weight:    188 lb 8 oz (85.5 kg)  Height:        Intake/Output Summary (Last 24 hours) at 04/25/16 0840 Last data filed at 04/25/16 0700  Gross per 24 hour  Intake             1660 ml  Output             3600 ml  Net            -1940 ml   Filed Weights   04/23/16 0522 04/24/16 0648 04/25/16 0635  Weight: 190 lb 4.8 oz (86.3 kg) 188 lb 6.4 oz (85.5 kg) 188 lb 8 oz (85.5 kg)   General: Elderly woman resting comfortably in bed, appears tired HEENT: [place]/AT, moist mucus membranes CV: Slow rate and regular rhythm, no m/g/r Pulm/Chest: CTA bil, normal work of breathing Ext: Trace pitting edema. Warm and well-perfused Skin: Warm, dry, intact  Assessment/Plan:  Deconditioning, due to poor sleep and prolonged hospital stay, sedentary with minimal activity. Poor balance and exercise tolerance with PT eval on 3/15 and advised dc to SNF. Patient agreeable. -- Ambulate today with nursing staff -- CSW consulted for SNF placement  Pulmonary hypertension, and right-sided heart failure, TTE on 3/12 showed LVEF 60-65%, RV dilated and hypokinetic with severely increased pulmonary artery pressure (estimated 76 mmHg). PAH likely multifactorial from COPD/OSA (group 3), and some left sided HF as LVEDP was elevated to 20 mm Hg (group 2).  Right heart cath today revealed moderated to severe pulmonary HTN and elevated R and L heart filling pressures. VQ inconsistent with chronic PE. PFTs showed severe COPD, gold stage 3. Net negative 8 lbs and 5L from admission. -- Appreciated HF team recommendations -- Lasix 80 mg daily, KCL 20 mEq daily -- Metoprolol XL 25 mg QD  NSTEMI: Troponins peaked at 4.98 on 3/10 and trended back down. On heparin gtt over the weekend and has been without chest pain. Underwent LHC today 3/12 which showed minimal CAD and no culprit lesion. Likely has ischemic event of small coronary vessel not seen on cath or demand ischemia in setting of hypervolemia.  - Appreciate Cardiology recs - Continue ASA, Crestor  Iron deficiency anemia: Recent hospitalization for GI bleed secondary to AVM. Discontinued anticoag. Hb stable 8-10 - Follow CBC - Continue home iron   GERD, resolved, was having persistent reflux symptoms and chest wall pain that did not seem to respond to Pepcid or Protonix -- GI cocktail TID PRN -- Voltaren gel QID PRN -- Pepcid 20 mg BID PRN -- Zofran and Phenergan PRN for nausea  PAF and s/p mitral valve repair, bioprosthetic MVR: Coumadin discontinued after GI bleed 03/2016. Has porcine valve. CHADSVASC score 6. Valve functioning  normally on TTE 3/12. -- Telemetry largely sinus brady -- Metoprolol XL 25 mg daily -- Continue home ASA  -- Consider restarting Warfarin outpatient with lowered INR goal (1.5-2.5) to avoid repeat GI bleeding  Type 2 DM: CBGs uncontrolled yesterday in high 200s, increase levemir from 20 to 30 QHS, was NPO much of today, now on liquid diet. Last A1c 7.6 in 12/2015 - Increase Levemir to 33 units QHS - SSI-M  TID AC HS  COPD, gold stage 3 /OSA on CPAP: Stable - Continue home Advair and Spiriva - CPAP QHS  Depression/Anxiety: - Continue home Fluoxetine and Alprazolam   Chronic Pain/Fibromyalgia:  - Continue home Norco, dc'd Flexeril due to delirium  Tobacco  Abuse: Smokes 1 pack per day. - Nicotine patch  Diet: Heart healthy  DVT PPx: Heparin TID  Dispo: Anticipated discharge today.  Asencion Partridge, MD 04/25/2016, 8:40 AM Pager: [phone]

## 2016-04-25 NOTE — Progress Notes (Signed)
Patient is medically stable for discharge. DC to [facility]. Patient will go by EMS in which LCSW will arrange.  No family to be called as family has been on the phone with patient throughout the day and aware of her transition.   Report:  [phone]  Lane Hacker, MSW Clinical Social Work: System Wide Float Coverage for :  [phone]

## 2016-04-25 NOTE — Progress Notes (Signed)
PETAR to 3E16 for transport of [name] to [facility].  Patient had no voiced complaints.  Packet sent with transporters.

## 2016-04-25 NOTE — Progress Notes (Signed)
Stable from HF perspective for discharge as long as BMET stable.    Follow up made for 05/08/16 in HF clinic.   Cardiac meds for home:  Lasix 80 mg daily KCl 20 daily Toprol XL 25 daily ASA 81 daily Crestor 20 daily Nicotine patch.    Legrand Como [address] [place], PA-C 04/25/2016 10:35 AM

## 2016-04-25 NOTE — Patient Outreach (Signed)
   Patient remains hospitalized, disposition includes short term SNF placement for short term rehabilitation.  Plan: Attempt to re-engage for community care coordination once patient is discharged from SNF.

## 2016-04-25 NOTE — Care Management Important Message (Signed)
Important Message  Patient Details  Name: [name] MRN: [id number] Date of Birth: 1950/03/29   Medicare Important Message Given:  Yes    [name] 04/25/2016, 1:00 PM

## 2016-04-26 IMAGING — CR DG CHEST 2V
2 series · 2 of 2 positions shown · non-contrast
Comparison: 12/30/2012.

CLINICAL DATA: Chest pain x1 day.

EXAM:
CHEST  2 VIEW

[w chest pa]
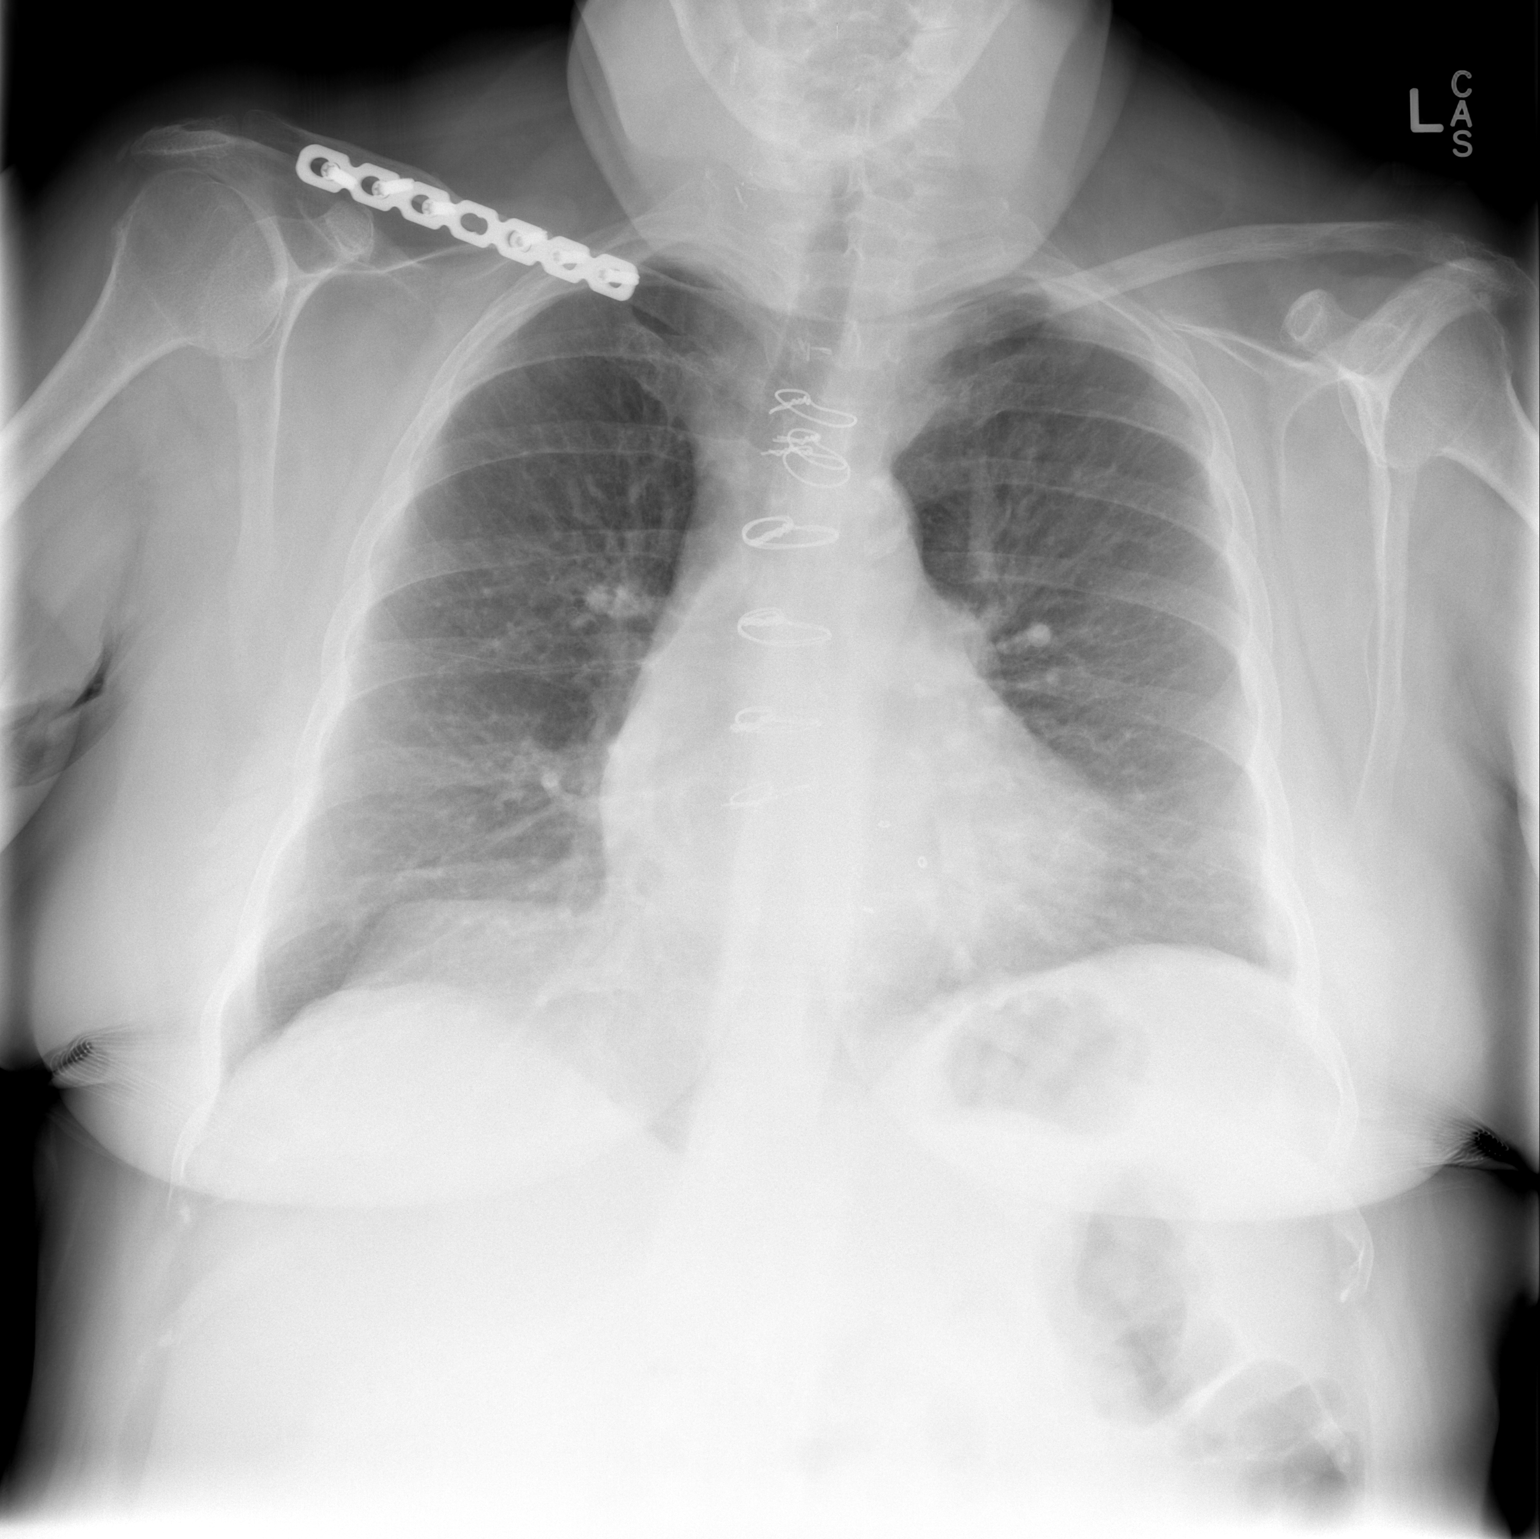

[w chest lat]
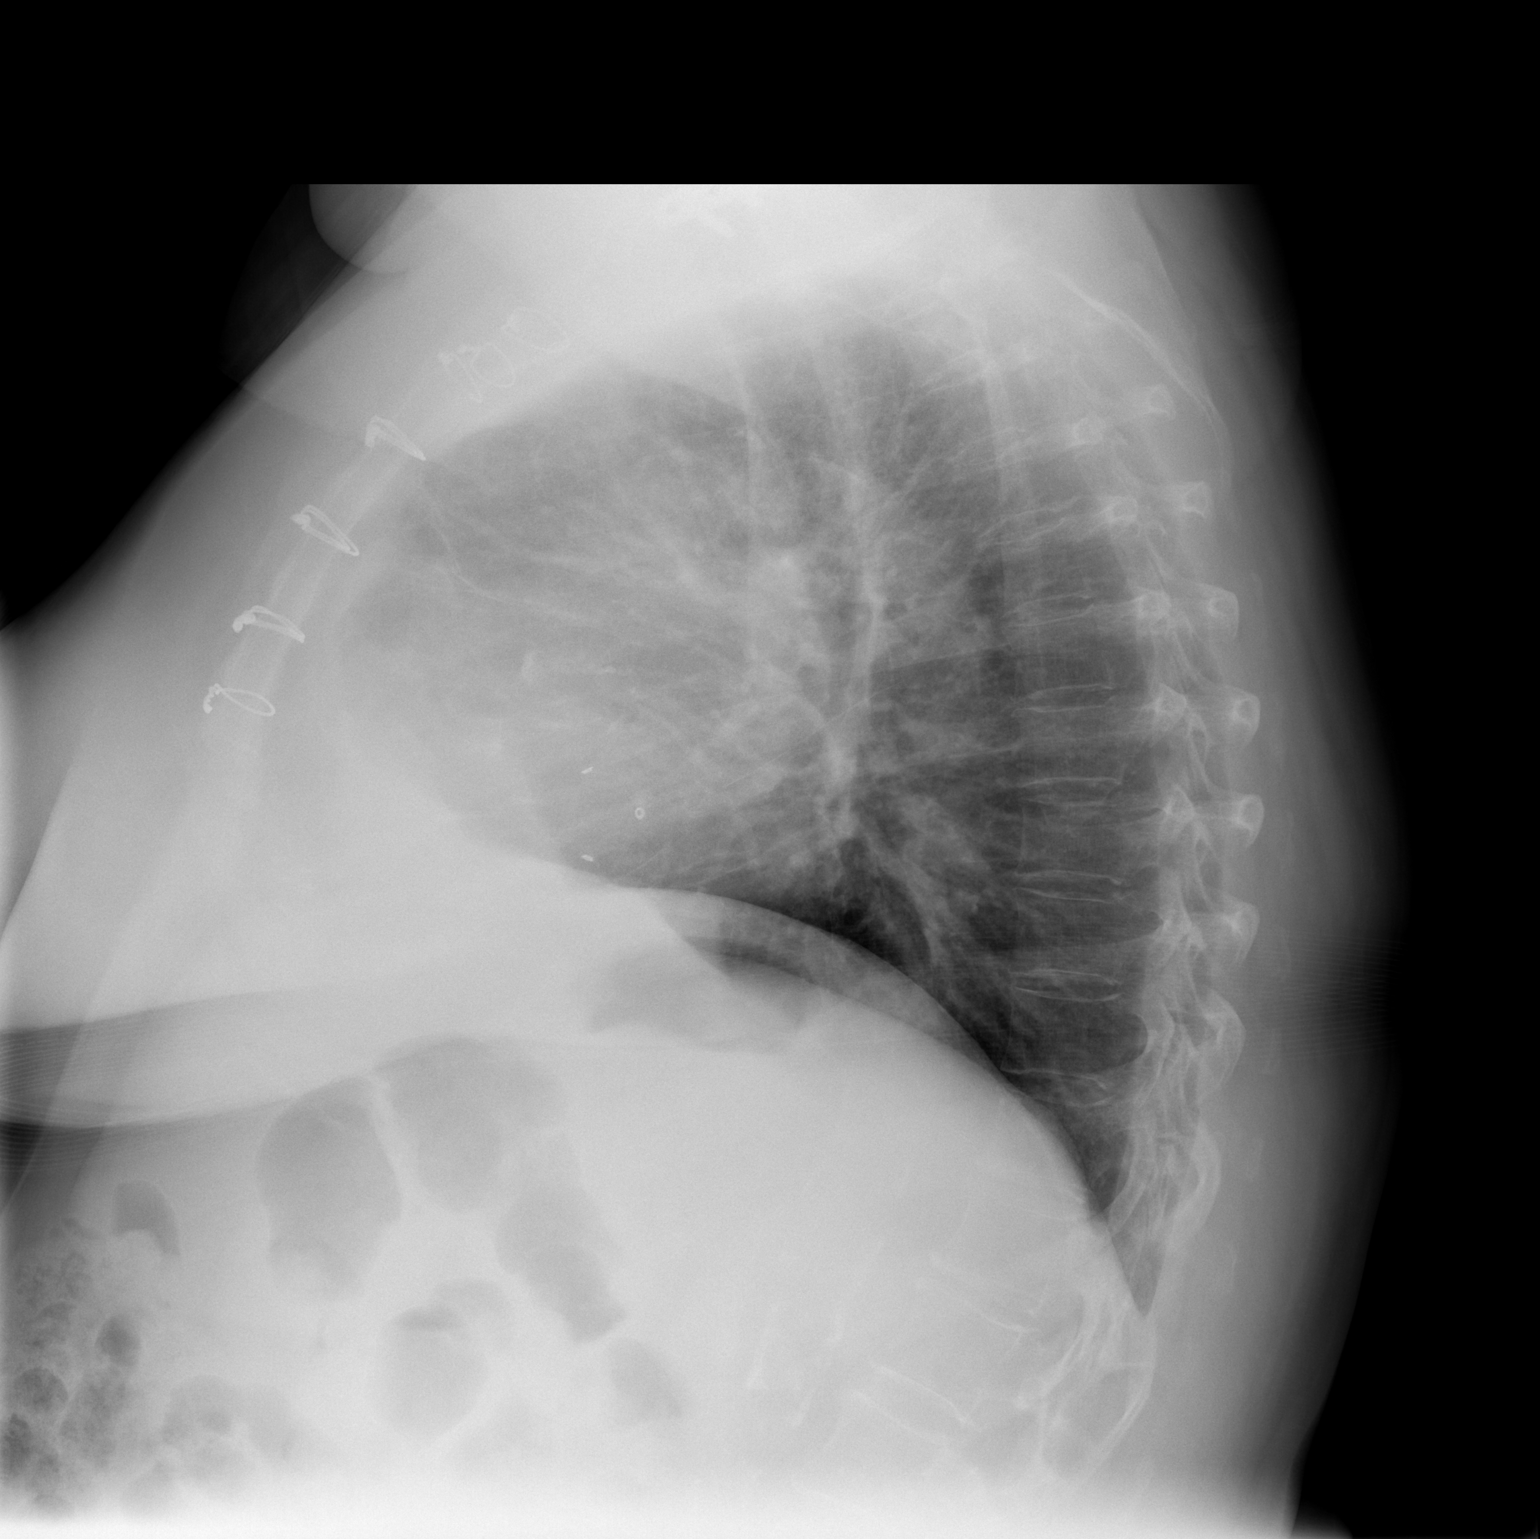

[2 of 2 positions shown; findings below may reference images not displayed]

FINDINGS: Mediastinum and hilar structures are normal. Prior CABG. Heart size
stable. Mild pulmonary vascular prominence and interstitial
prominence noted. Very mild congestive heart failure cannot be
excluded. No pleural effusion or pneumothorax. Prior plate and screw
fixation of right clavicular fracture. Diffuse thoracic spine
osteopenia degenerative change.
IMPRESSION: 1. Prior CABG. Mild pulmonary vascular prominence and interstitial
prominence noted. Very mild component of congestive heart failure
cannot be excluded. No or pulmonary alveolar edema. No significant
pleural effusion.
2. Prior right clavicular fracture plate and screw fixation.

## 2016-04-28 ENCOUNTER — Encounter: Payer: Self-pay | Admitting: Adult Health

## 2016-04-28 ENCOUNTER — Non-Acute Institutional Stay (SKILLED_NURSING_FACILITY): Payer: PPO | Admitting: Adult Health

## 2016-04-28 DIAGNOSIS — Z720 Tobacco use: Secondary | ICD-10-CM

## 2016-04-28 DIAGNOSIS — I5032 Chronic diastolic (congestive) heart failure: Secondary | ICD-10-CM

## 2016-04-28 DIAGNOSIS — B373 Candidiasis of vulva and vagina: Secondary | ICD-10-CM

## 2016-04-28 DIAGNOSIS — B3731 Acute candidiasis of vulva and vagina: Secondary | ICD-10-CM

## 2016-04-28 DIAGNOSIS — F419 Anxiety disorder, unspecified: Secondary | ICD-10-CM | POA: Diagnosis not present

## 2016-04-28 DIAGNOSIS — E785 Hyperlipidemia, unspecified: Secondary | ICD-10-CM | POA: Diagnosis not present

## 2016-04-28 DIAGNOSIS — R5381 Other malaise: Secondary | ICD-10-CM

## 2016-04-28 DIAGNOSIS — Z8719 Personal history of other diseases of the digestive system: Secondary | ICD-10-CM

## 2016-04-28 DIAGNOSIS — I214 Non-ST elevation (NSTEMI) myocardial infarction: Secondary | ICD-10-CM

## 2016-04-28 DIAGNOSIS — E1140 Type 2 diabetes mellitus with diabetic neuropathy, unspecified: Secondary | ICD-10-CM | POA: Diagnosis not present

## 2016-04-28 DIAGNOSIS — J438 Other emphysema: Secondary | ICD-10-CM

## 2016-04-28 DIAGNOSIS — E876 Hypokalemia: Secondary | ICD-10-CM

## 2016-04-28 DIAGNOSIS — B372 Candidiasis of skin and nail: Secondary | ICD-10-CM

## 2016-04-28 DIAGNOSIS — I480 Paroxysmal atrial fibrillation: Secondary | ICD-10-CM

## 2016-04-28 DIAGNOSIS — F329 Major depressive disorder, single episode, unspecified: Secondary | ICD-10-CM | POA: Diagnosis not present

## 2016-04-28 DIAGNOSIS — G4733 Obstructive sleep apnea (adult) (pediatric): Secondary | ICD-10-CM

## 2016-04-28 NOTE — Progress Notes (Signed)
DATE:  04/28/2016   MRN:  [id number]  BIRTHDAY: [birth year]  Facility:  Nursing Home Location:  [facility]  Nursing Home Room Number: 401-P  LEVEL OF CARE:  SNF (31)  Contact Information    Name Relation Home Work [place] Sister [phone]  [phone]   Miller,[name] Daughter   [phone]       Code Status History    Date Active Date Inactive Code Status Order ID Comments User Context   04/19/2016  4:21 AM 04/25/2016 10:11 PM Full Code [id number]  Gwynn Burly, DO Inpatient   03/27/2016  9:41 PM 03/29/2016  9:05 PM Full Code [id number]  Denton Brick, MD ED   06/27/2015  1:52 PM 07/03/2015  6:52 PM Full Code [id number]  Hyacinth Meeker, MD ED   12/26/2014  6:13 PM 12/27/2014  9:36 PM Full Code [id number]  Yolanda Manges, DO Inpatient   10/20/2014 12:41 AM 10/26/2014  2:46 PM Full Code [id number]  Aldean Baker, MD ED   10/14/2014  6:27 AM 10/16/2014  4:05 PM Full Code [id number]  Aldean Baker, MD Inpatient   01/16/2014  5:50 PM 01/18/2014  6:49 PM Full Code [id number]  Yolanda Manges, DO Inpatient   01/07/2013  6:38 PM 01/10/2013  6:03 PM Full Code [id number]  Annett Gula, MD Inpatient   12/03/2012 11:56 AM 12/04/2012  4:40 PM Full Code [id number]  Lorretta Harp, MD Inpatient   05/04/2012  4:08 AM 05/07/2012  4:23 PM Full Code [id number]  Elyse Jarvis, MD Inpatient       Chief Complaint  Patient presents with  . Hospitalization Follow-up    HISTORY OF PRESENT ILLNESS:  This is a 66-YO female seen for hospital follow-up.  She was admitted to [facility] for short-term rehabilitation on 04/25/2016 following an admission at [facility] 04/18/2016-04/25/2016 for NSTEMI.  She was started on Heparin drip until 3/12 when she had left heart catheterization and coronary angiography. NSTEMI was thought to be due to demand ischemia in setting of RV dysfunction or anemia.  She was started on Metoprolol and continued on ASA and Rosuvastatin. She has PMH of  bioprosthetic MVR 2012, IDDM, COPD and tobacco use.  She was seen in the room and complained of vaginal itching and erythematous rashes to abdominal folds and under right breast.   PAST MEDICAL HISTORY:  Past Medical History:  Diagnosis Date  . Adenomatous polyps 05/14/2011   Colonoscopy (05/2011): 4 mm adenomatous polyp excised endoscopically Colonoscopy (02/2002): Adenomatous polyp excised endoscopically   . Allergic rhinitis 06/01/2012  . Anemia of chronic disease 01/01/2013  . Anxiety 07/24/2010  . Aortic atherosclerosis (HCC) 10/19/2014   Seen on CT scan, currently asymptomatic  . 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 Asymptomatic cholelithiasis 09/25/2015   Seen on CT scan 08/2015  . Bilateral cataracts 08/04/2012   Visually insignificant   . Carotid artery stenosis    s/p right endarterectomy (06/2010) Carotid US (07/2010):  Left: Moderate-to-severe (60-79%) calcific and non-calcific plaque origin and proximal ICA and ECA   . Chronic congestive heart failure with left ventricular diastolic dysfunction (HCC) 10/21/2010  . Chronic constipation 02/03/2011  . Chronic daily headache 01/16/2014  . Chronic low back pain 10/06/2012  . Chronic venous insufficiency 08/04/2012  . Clear cell renal cell carcinoma (HCC) 07/21/2011   s/p cryoablation of left RCC in 09/2011 by Dr. Fredia Sorrow. Followed by Dr.  Dahlstedt  [facility]) .    Marland Kitchen COPD (chronic obstructive pulmonary disease) with emphysema (HCC)    PFTs 02/2012: FEV1 0.92 (40%), ratio 69, 27% increase in FEV1 with BD, TLC 91%, severe airtrapping, DLCO49% On chronic home O2. Pulmonary rehab referral 05/2012   . Depression 11/19/2005  . Esophageal stenosis 08/08/2015   Mild, benign-appearing on EGD 06/29/2015  . Fibromyalgia 08/29/2010  . Gastroesophageal reflux disease   . History of blood transfusion    "several times"   . Hyperlipidemia LDL goal < 100  11/20/2005  . Internal hemorrhoids 08/04/2012  . Mitral stenosis    s/p Mitral valve replacement with a 27-mm pericardial porcine valve (Medtronic Mosaic valve, serial #[id number] on 09/20/10, Dr. Donata Clay)   . Moderate to severe pulmonary hypertension    2014 TEE w PA peak pressure 46 mmHg, s/p MV replacement   . Obesity (BMI 30.0-34.9) 10/23/2011  . Obstructive sleep apnea    Nocturnal polysomnography (06/2009): Moderate sleep apnea/ hypopnea syndrome , AHI 17.8 per hour with nonpositional hypopneas. CPAP titration to 12 CWP, AHI 2.4 per hour. On nocturnal CPAP via a small resMed Quattro full-face mask with heated humidifier.   . Osteoporosis    DEXA (12/09/2011): L-spine T -3.7, left hip T -1.4 DEXA (12/2004): L-spine T -2.6, left hip -0.1   . Paroxysmal atrial fibrillation (HCC) 10/22/2010   s/p Left atrial maze procedure for paroxysmal atrial fibrillation on 09/20/2010 by Dr Donata Clay.  Subsequent splenic infarct, decision was made to re-anticoagulate with coumadin, likely life-long as this is the most likely cause of the splenic infarct.   . Right nephrolithiasis 09/06/2014   5 mm non-obstructing calculus seen on CT scan 09/05/2014   . Seborrheic keratosis 09/28/2015  . Shortness of breath dyspnea   . Tobacco abuse 07/28/2012  . Type 2 diabetes mellitus with diabetic neuropathy (HCC)      CURRENT MEDICATIONS: Reviewed  Patient's Medications  New Prescriptions   No medications on file  Previous Medications   ALBUTEROL (PROAIR HFA) 108 (90 BASE) MCG/ACT INHALER    Inhale 1-2 puffs into the lungs every 6 (six) hours as needed for shortness of breath.   ALPRAZOLAM (XANAX) 1 MG TABLET    Take 0.5-1 tablets (0.5-1 mg total) by mouth 2 (two) times daily as needed for anxiety.   ASPIRIN 81 MG CHEWABLE TABLET    Chew 1 tablet (81 mg total) by mouth daily.   BLOOD GLUCOSE MONITORING SUPPL (ONETOUCH VERIO) W/DEVICE KIT    1 each by Does not apply route 4 (four) times daily.   CHLORPHENIRAMINE  (CHLOR-TRIMETON) 4 MG TABLET    Take 1 tablet (4 mg total) by mouth 3 (three) times daily as needed for allergies.   CYCLOBENZAPRINE (FLEXERIL) 5 MG TABLET    Take 1 tablet (5 mg total) by mouth 3 (three) times daily as needed for muscle spasms.   EASY TOUCH PEN NEEDLES 31G X 5 MM MISC    USE TO INJECT INSULIN 5 TIMES DAILY   FERROUS GLUCONATE (FERGON) 324 MG TABLET    Take 1 tablet (324 mg total) by mouth 2 (two) times daily with a meal.   FLUOXETINE (PROZAC) 20 MG TABLET    Take 2 tablets (40 mg total) by mouth daily.   FLUTICASONE (FLONASE) 50 MCG/ACT NASAL SPRAY    Place 2 sprays into both nostrils daily.   FLUTICASONE-SALMETEROL (ADVAIR DISKUS) 500-50 MCG/DOSE AEPB    Inhale 1 puff into the lungs 2 (two) times daily.  FUROSEMIDE (LASIX) 80 MG TABLET    Take 1 tablet (80 mg total) by mouth daily.   GABAPENTIN (NEURONTIN) 300 MG CAPSULE    Take 2 capsules (600 mg total) by mouth 3 (three) times daily.   HYDROCODONE-ACETAMINOPHEN (NORCO/VICODIN) 5-325 MG TABLET    Take 1-2 tablets by mouth every 6 (six) hours as needed for severe pain.   INSULIN DETEMIR (LEVEMIR FLEXTOUCH) 100 UNIT/ML PEN    Inject 35 Units into the skin daily at 10 pm.   INSULIN DISPOSABLE PUMP (V-GO 40) KIT    FILL AND PLACE VGO40 ABOUT THE SAME TIME EACH DAY. USE 4 TO 5 CLICKS WITH MEALS AND 2 CLICKS WITH SNACKS   INSULIN LISPRO (HUMALOG) 100 UNIT/ML INJECTION    Inject 0-15 Units into the skin 3 (three) times daily before meals. 7-120 = 0 units, 121-150 = 2 units, 151-200 = 3 units, 201-250 = 5 units, 251-300 = 8 units, 301-350 = 11 units, 351-400 = 15 units   INSULIN SYRINGE-NEEDLE U-100 31G X 15/64" 0.5 ML MISC    Use to inject insulin up to 4 times a day   LANCETS MISC. (ACCU-CHEK FASTCLIX LANCET) KIT    Check your blood 4 times a day dx code 250.00 insulin requiring   LIDOCAINE (XYLOCAINE) 2 % JELLY    APPLY TO AFFECTED AREA THREE TIMES A DAY   METOPROLOL SUCCINATE (TOPROL-XL) 25 MG 24 HR TABLET    Take 1 tablet (25 mg  total) by mouth daily.   NICOTINE (NICODERM CQ - DOSED IN MG/24 HOURS) 21 MG/24HR PATCH    Place 1 patch (21 mg total) onto the skin daily.   NOVOLOG 100 UNIT/ML INJECTION    USE TO FILL VGO40 TO WEAR DAILY.   NYSTATIN CREAM (MYCOSTATIN)    APPLY TO AFFECTED AREA TWICE DAILY   OMEPRAZOLE (PRILOSEC) 40 MG CAPSULE    Take 1 capsule (40 mg total) by mouth 2 (two) times daily.   ONETOUCH VERIO TEST STRIP    Check blood sugar up to 4 times a day   OXYGEN    Inhale 2 L into the lungs as needed (for shortness of breath while active).   POTASSIUM CHLORIDE SA (K-DUR,KLOR-CON) 20 MEQ TABLET    TAKE 1 TABLET BY MOUTH TWICE DAILY   PRESCRIPTION MEDICATION    Inhale into the lungs See admin instructions. CPAP - use whenever sleeping   PROMETHAZINE (PHENERGAN) 12.5 MG TABLET    Take 1 tablet (12.5 mg total) by mouth every 8 (eight) hours as needed for nausea or vomiting.   ROSUVASTATIN (CRESTOR) 20 MG TABLET    TAKE 1 TABLET BY MOUTH EVERY NIGHT AT BEDTIME   SPIRIVA HANDIHALER 18 MCG INHALATION CAPSULE    INHALE THE CONTENTS OF 1 CAPSULE VIA HANDIHALER BY MOUTH EVERY DAY  Modified Medications   No medications on file  Discontinued Medications   INSULIN ASPART (NOVOLOG) 100 UNIT/ML INJECTION    Inject 18-28 Units into the skin See admin instructions. Inject 18 units plus adjustment for CBG (sliding scale) - 3 times daily before meals     Allergies  Allergen Reactions  . Morphine And Related Other (See Comments)    Injection site reaction  . Oxycontin [Oxycodone] Other (See Comments)    headache  . Tramadol Hcl Swelling    Ankle swelling  . Lorazepam Other (See Comments)    Patient's sister noted that ativan caused the patient to become extremely confused during hospitalization 09/2010; tolerates Xanax  REVIEW OF SYSTEMS:  GENERAL: no change in appetite, no fatigue, no weight changes, no fever, chills or weakness EYES: Denies change in vision, dry eyes, eye pain, itching or discharge EARS:  Denies change in hearing, ringing in ears, or earache NOSE: Denies nasal congestion or epistaxis MOUTH and THROAT: Denies oral discomfort, gingival pain or bleeding, pain from teeth or hoarseness   RESPIRATORY: no cough, SOB, DOE, wheezing, hemoptysis CARDIAC: no chest pain, edema or palpitations GI: no abdominal pain, diarrhea, constipation, heart burn, nausea or vomiting GU: Denies dysuria, frequency, hematuria, incontinence, or discharge PSYCHIATRIC: Denies feeling of depression or anxiety. No report of hallucinations, insomnia, paranoia, or agitation    PHYSICAL EXAMINATION  GENERAL APPEARANCE: Well nourished. In no acute distress. Obese SKIN:  Erythematous rashes under right breast and abdominal folds HEAD: Normal in size and contour. No evidence of trauma EYES: Lids open and close normally. No blepharitis, entropion or ectropion. PERRL. Conjunctivae are clear and sclerae are white. Lenses are without opacity EARS: Pinnae are normal. Patient hears normal voice tunes of the examiner MOUTH and THROAT: Lips are without lesions. Oral mucosa is moist and without lesions. Tongue is normal in shape, size, and color and without lesions NECK: supple, trachea midline, no neck masses, no thyroid tenderness, no thyromegaly LYMPHATICS: no LAN in the neck, no supraclavicular LAN RESPIRATORY: breathing is even & unlabored, BS CTAB CARDIAC: RRR, no murmur,no extra heart sounds, no edema GI: abdomen soft, normal BS, no masses, no tenderness, no hepatomegaly, no splenomegaly EXTREMITIES:  Able to move X 4 extremities  PSYCHIATRIC: Alert and oriented X 3. Affect and behavior are appropriate  LABS/RADIOLOGY: Labs reviewed: Basic Metabolic Panel:  Recent Labs  78/29/56 0505 04/24/16 0505 04/25/16 0932  NA 135 132* 133*  K 3.8 4.2 3.8  CL 100* 96* 96*  CO2 27 27 27   GLUCOSE 230* 225* 309*  BUN 21* 26* 25*  CREATININE 1.04* 1.18* 1.10*  CALCIUM 9.3 9.7 9.1   Liver Function Tests:  Recent  Labs  06/28/15 0241 03/27/16 1645  AST 79* 17  ALT 72* 12*  ALKPHOS 84 41  BILITOT 1.5* 0.3  PROT 5.6* 6.2*  ALBUMIN 2.7* 3.3*    Recent Labs  03/27/16 1645  LIPASE 11   CBC:  Recent Labs  06/27/15 1139  09/28/15 1156  04/22/16 0427 04/23/16 0505 04/24/16 0505  WBC 9.0  < > 6.0  < > 7.0 6.8 7.5  NEUTROABS 6.9  --  3.7  --   --   --   --   HGB 3.6*  < >  --   < > 8.7* 9.4* 9.9*  HCT 13.5*  < > 31.2*  < > 29.2* 30.8* 32.1*  MCV 68.2*  < > 85  < > 93.6 91.9 90.9  PLT 329  < > 203  < > 169 166 177  < > = values in this interval not displayed. Lipid Panel:  Recent Labs  04/19/16 1039  HDL 28*   Cardiac Enzymes:  Recent Labs  04/19/16 0428 04/19/16 1039 04/20/16 0912  TROPONINI 2.90* 4.98* 2.35*   CBG:  Recent Labs  04/25/16 0747 04/25/16 1132 04/25/16 1624  GLUCAP 302* 237* 244*      Dg Chest 2 View  Result Date: 04/22/2016 CLINICAL DATA:  Chest pain and dry cough EXAM: CHEST  2 VIEW COMPARISON:  04/18/2016 FINDINGS: Cardiac shadow is within normal limits. Postsurgical changes are again seen. Aortic calcifications are again noted. Multiple right rib fractures are again  seen with healing. Chronic blunting of left costophrenic angle is noted. No focal infiltrate or sizable effusion is seen. IMPRESSION: No acute abnormality noted. Electronically Signed   By: Alcide Clever M.D.   On: 04/22/2016 17:49   Dg Chest 2 View  Result Date: 04/18/2016 CLINICAL DATA:  Mid chest pain x2 hours for chest swelling. EXAM: CHEST  2 VIEW COMPARISON:  06/27/2015 FINDINGS: Stable cardiomegaly. The aorta is atherosclerotic without aneurysm. Mild interstitial edema without pneumonic consolidation, effusion or pneumothorax. The patient is status post median sternotomy. Old right-sided rib fractures are noted with healing. Plate and screw fixation of the mid clavicle. Vascular clips are seen at along the right side of the neck. IMPRESSION: Mild interstitial edema. Aortic  atherosclerosis. Multiple chronic right-sided rib fractures. Plate and screw fixation of the mid right clavicle. Electronically Signed   By: Tollie Eth M.D.   On: 04/18/2016 22:09   Ct Head Wo Contrast  Result Date: 04/18/2016 CLINICAL DATA:  Headache and left upper extremity pain for 2 weeks. EXAM: CT HEAD WITHOUT CONTRAST TECHNIQUE: Contiguous axial images were obtained from the base of the skull through the vertex without intravenous contrast. COMPARISON:  01/07/2013 FINDINGS: Brain: There is no intracranial hemorrhage, mass or evidence of acute infarction. There is no extra-axial fluid collection. Gray matter and white matter appear normal. Cerebral volume is normal for age. Brainstem and posterior fossa are unremarkable. The CSF spaces appear normal. Vascular: No hyperdense vessel or unexpected calcification. Skull: Normal. Negative for fracture or focal lesion. Sinuses/Orbits: No acute finding. Other: None. IMPRESSION: Normal brain Electronically Signed   By: Ellery Plunk M.D.   On: 04/18/2016 22:11   Nm Pulmonary Perf And Vent  Result Date: 04/22/2016 CLINICAL DATA:  History of COPD, chest pain EXAM: NUCLEAR MEDICINE VENTILATION - PERFUSION LUNG SCAN TECHNIQUE: Ventilation images were obtained in multiple projections using inhaled aerosol Tc-69m DTPA. Perfusion images were obtained in multiple projections after intravenous injection of Tc-58m MAA. RADIOPHARMACEUTICALS:  33 mCi Technetium-40m DTPA aerosol inhalation and 4.15 mCi Technetium-6m MAA IV COMPARISON:  Radiograph 04/22/2016 FINDINGS: Ventilation: Heterogeneous ventilation, consistent with obstructive airways disease. Perfusion: Mild heterogeneous perfusion, but no large wedge-shaped peripheral defects. Heterogenous perfusion is in the general distribution of the heterogeneous lung ventilation. IMPRESSION: 1. Overall low probability study for pulmonary embolus given heterogenous perfusion. 2. Heterogenous ventilation, consistent with  obstructive airways disease. Electronically Signed   By: Jasmine Pang M.D.   On: 04/22/2016 20:25   Ir Radiologist Eval & Mgmt  Result Date: 04/18/2016 Please refer to notes tab for details about interventional procedure. (Op Note)   ASSESSMENT/PLAN:  Physical deconditioning - for rehabilitation, PT and OT, for therapeutic strengthening exercises; fall precautions  NSTEMI - she was started on heparin drip and continued until  3/12 when she had left heart catheterization and coronary angiography. NSTEMI was thought to be due to demand ischemia in setting of RV dysfunction or anemia; she was started on metoprolol succinate ER 25 mg 1 tab by mouth Q D, continue aspirin 81 mg 1 tab by mouth daily and Crestor 20 mg 1 tab by mouth daily at bedtime; follow-up with Dr. Marca Ancona, cardiology, on 05/09/16; check CBC  Vaginal yeast infection  - give Diflucan 150 mg by mouth 1; encourage perineal hygiene  Candida, skin infection - start nystatin cream to abdominal folds and under right breast twice a day 3 weeks  PAF - rate-controlled; continue metoprolol succinate ER 25 mg 1 tab by mouth Q D  COPD -  no SOB; continue Advair 500-50 mcg Diskus inhale 1 puff into the lungs twice a day, Spiriva 18 g inhale 1 capsule content via HandiHaler by mouth daily  Hypokalemia - K 3.8 continue KCl ER 20 meq 1 tab by mouth twice a day  Major depression - mood is stable; continue Prozac 40 mg by mouth daily  Chronic diastolic CHF - continue Lasix 80 mg 1 tab by mouth daily; check BMP  Tobacco use - continue nicotine 21 mg/24-hour 1 patch on to skin daily  Hyperlipidemia - continue Crestor 20 mg 1 tab by mouth daily at bedtime  Diabetes mellitus, type II with neuropathy - continue Levemir 100 units/mL injected 35 units in to skin daily at 10 PM,  Humalog sliding scale subcutaneous 4 times a day and gabapentin 600 mg 1 tab by mouth TID   Anxiety - mood this is stable; continue Xanax 0.5 mg 1 tab by mouth  twice a day when necessary  OSA - continue CPAP at at bedtime  Hx of GI bleed - continue Prilosec 40 mg 1 capsule by mouth twice a day     Goals of care:  Short-term rehabilitation    Gedalya Jim C. Medina-Vargas - NP    [employer] [phone]

## 2016-04-29 ENCOUNTER — Encounter: Payer: Self-pay | Admitting: *Deleted

## 2016-04-29 ENCOUNTER — Ambulatory Visit: Payer: PPO

## 2016-04-29 ENCOUNTER — Non-Acute Institutional Stay (SKILLED_NURSING_FACILITY): Payer: PPO | Admitting: Internal Medicine

## 2016-04-29 ENCOUNTER — Encounter: Payer: Self-pay | Admitting: Internal Medicine

## 2016-04-29 DIAGNOSIS — I272 Pulmonary hypertension, unspecified: Secondary | ICD-10-CM | POA: Diagnosis not present

## 2016-04-29 DIAGNOSIS — I480 Paroxysmal atrial fibrillation: Secondary | ICD-10-CM

## 2016-04-29 DIAGNOSIS — I214 Non-ST elevation (NSTEMI) myocardial infarction: Secondary | ICD-10-CM

## 2016-04-29 DIAGNOSIS — B373 Candidiasis of vulva and vagina: Secondary | ICD-10-CM | POA: Diagnosis not present

## 2016-04-29 DIAGNOSIS — G8929 Other chronic pain: Secondary | ICD-10-CM

## 2016-04-29 DIAGNOSIS — M545 Low back pain, unspecified: Secondary | ICD-10-CM

## 2016-04-29 DIAGNOSIS — I270 Primary pulmonary hypertension: Secondary | ICD-10-CM | POA: Diagnosis not present

## 2016-04-29 DIAGNOSIS — J438 Other emphysema: Secondary | ICD-10-CM

## 2016-04-29 DIAGNOSIS — I5032 Chronic diastolic (congestive) heart failure: Secondary | ICD-10-CM | POA: Diagnosis not present

## 2016-04-29 DIAGNOSIS — G4733 Obstructive sleep apnea (adult) (pediatric): Secondary | ICD-10-CM | POA: Diagnosis not present

## 2016-04-29 DIAGNOSIS — D509 Iron deficiency anemia, unspecified: Secondary | ICD-10-CM | POA: Diagnosis not present

## 2016-04-29 DIAGNOSIS — E1140 Type 2 diabetes mellitus with diabetic neuropathy, unspecified: Secondary | ICD-10-CM

## 2016-04-29 DIAGNOSIS — B3731 Acute candidiasis of vulva and vagina: Secondary | ICD-10-CM

## 2016-04-29 DIAGNOSIS — R5381 Other malaise: Secondary | ICD-10-CM | POA: Diagnosis not present

## 2016-04-29 LAB — BASIC METABOLIC PANEL
BUN: 30 mg/dL — AB (ref 4–21)
Creatinine: 1.2 mg/dL — AB (ref 0.5–1.1)
Glucose: 214 mg/dL
Potassium: 4.8 mmol/L (ref 3.4–5.3)
Sodium: 138 mmol/L (ref 137–147)

## 2016-04-29 LAB — CBC AND DIFFERENTIAL
HCT: 33 % — AB (ref 36–46)
Hemoglobin: 10.2 g/dL — AB (ref 12.0–16.0)
Neutrophils Absolute: 4 /uL
Platelets: 184 10*3/uL (ref 150–399)
WBC: 6.5 10^3/mL

## 2016-04-29 NOTE — Progress Notes (Signed)
LOCATION: [place]  PCP: Karren Cobble, MD   Code Status: Full Code  Goals of care: Advanced Directive information Advanced Directives 04/19/2016  Does Patient Have a Medical Advance Directive? No  Would patient like information on creating a medical advance directive? No - Patient declined  Pre-existing out of facility DNR order (yellow form or pink MOST form) -       Extended Emergency Contact Information Primary Emergency Contact: Brower,Beverly Address: [address]          [place], [place] [address] Johnnette Litter of [place] Phone: [phone] Mobile Phone: [phone] Relation: Sister Secondary Emergency Contact: [place] of [place] Mobile Phone: [phone] Relation: Daughter   Allergies  Allergen Reactions  . Morphine And Related Other (See Comments)    Injection site reaction  . Oxycontin [Oxycodone] Other (See Comments)    headache  . Tramadol Hcl Swelling    Ankle swelling  . Lorazepam Other (See Comments)    Patient's sister noted that ativan caused the patient to become extremely confused during hospitalization 09/2010; tolerates Xanax    Chief Complaint  Patient presents with  . New Admit To SNF    New Admission Visit      HPI:  Patient is a 66 y.o. female seen today for short term rehabilitation post hospital admission from 04/18/16-04/25/16 with NSTEMI. She underwent left heart catheterization and coronary angiography showing minimal stenosis of ostial RCA and LM. She is on medical management. She was found to have severe pulmonary artery hypertension and right ventricular failure. She has medical history of afib, CAD, COPD, OSA, type 2 DM among others. She is seen in her room today.   Review of Systems:  Constitutional: Negative for fever, chills, diaphoresis. Energy level is fair at present HENT: Negative for headache, congestion, nasal discharge, sore throat, difficulty swallowing.   Eyes: Negative for eye  pain, blurred vision, double vision and discharge.  Respiratory: Positive for cough. Negative for shortness of breath and wheezing.   Cardiovascular: Negative for chest pain, palpitations, leg swelling.  Gastrointestinal: Negative for vomiting,loss of appetite, melena, constipation. Positive for nausea, heartburn. Last bowel movement was on Sunday.  Genitourinary: Negative for dysuria. Positive for itching to vaginal area Musculoskeletal: Negative for back pain, fall in the facility.  Skin: Negative for itching, rash.  Neurological: Negative for weakness. Positive for dizziness. Psychiatric/Behavioral: Negative for depression.   Past Medical History:  Diagnosis Date  . Adenomatous polyps 05/14/2011   Colonoscopy (05/2011): 4 mm adenomatous polyp excised endoscopically Colonoscopy (02/2002): Adenomatous polyp excised endoscopically   . Allergic rhinitis 06/01/2012  . Anemia of chronic disease 01/01/2013  . Anxiety 07/24/2010  . Aortic atherosclerosis ([place]) 10/19/2014   Seen on CT scan, currently asymptomatic  . 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 Asymptomatic cholelithiasis 09/25/2015   Seen on CT scan 08/2015  . Bilateral cataracts 08/04/2012   Visually insignificant   . Carotid artery stenosis    s/p right endarterectomy (06/2010) Carotid US (07/2010):  Left: Moderate-to-severe (60-79%) calcific and non-calcific plaque origin and proximal ICA and ECA   . Chronic congestive heart failure with left ventricular diastolic dysfunction ([place]) 10/21/2010  . Chronic constipation 02/03/2011  . Chronic daily headache 01/16/2014  . Chronic low back pain 10/06/2012  . Chronic venous insufficiency 08/04/2012  . Clear cell renal cell carcinoma ([place]) 07/21/2011   s/p cryoablation of left RCC in 09/2011 by [name].  Followed by [name]  [facility]) .    Marland Kitchen COPD (chronic obstructive pulmonary disease) with  emphysema (HCC)    PFTs 02/2012: FEV1 0.92 (40%), ratio 69, 27% increase in FEV1 with BD, TLC 91%, severe airtrapping, DLCO49% On chronic home O2. Pulmonary rehab referral 05/2012   . Depression 11/19/2005  . Esophageal stenosis 08/08/2015   Mild, benign-appearing on EGD 06/29/2015  . Fibromyalgia 08/29/2010  . Gastroesophageal reflux disease   . History of blood transfusion    "several times"   . Hyperlipidemia LDL goal < 100 11/20/2005  . Internal hemorrhoids 08/04/2012  . Mitral stenosis    s/p Mitral valve replacement with a 27-mm pericardial porcine valve (Medtronic Mosaic valve, serial #[id number] on 09/20/10, Dr. Prescott Gum)   . Moderate to severe pulmonary hypertension    2014 TEE w PA peak pressure 46 mmHg, s/p MV replacement   . Obesity (BMI 30.0-34.9) 10/23/2011  . Obstructive sleep apnea    Nocturnal polysomnography (06/2009): Moderate sleep apnea/ hypopnea syndrome , AHI 17.8 per hour with nonpositional hypopneas. CPAP titration to 12 CWP, AHI 2.4 per hour. On nocturnal CPAP via a small resMed Quattro full-face mask with heated humidifier.   . Osteoporosis    DEXA (12/09/2011): L-spine T -3.7, left hip T -1.4 DEXA (12/2004): L-spine T -2.6, left hip -0.1   . Paroxysmal atrial fibrillation ([place]) 10/22/2010   s/p Left atrial maze procedure for paroxysmal atrial fibrillation on 09/20/2010 by Dr Prescott Gum.  Subsequent splenic infarct, decision was made to re-anticoagulate with coumadin, likely life-long as this is the most likely cause of the splenic infarct.   . Right nephrolithiasis 09/06/2014   5 mm non-obstructing calculus seen on CT scan 09/05/2014   . Seborrheic keratosis 09/28/2015  . Shortness of breath dyspnea   . Tobacco abuse 07/28/2012  . Type 2 diabetes mellitus with diabetic neuropathy [facility])    Past Surgical History:  Procedure Laterality Date  . CARDIAC VALVE REPLACEMENT  Aug. 2012   "mitral valve"  . CAROTID ENDARTERECTOMY Right 07/04/2010   by [name] for asymptomatic  right carotid artery stenosis  . CHEST TUBE INSERTION  09/24/2010   Dr Prescott Gum  . COLONOSCOPY  05/12/2011   performed by Dr. Michail Sermon. Showing small internal hemorrhoids, single tubular adenoma polyp  . CRYOABLATION Left 09/2011   by [name]. Followed by [name]  [facility]) .    Marland Kitchen DILATION AND CURETTAGE OF UTERUS    . ESOPHAGOGASTRODUODENOSCOPY  05/12/2011   performed by Dr. Michail Sermon. Negative for ulcerations, biopsy negative for evidence of celiac sprue  . ESOPHAGOGASTRODUODENOSCOPY N/A 06/29/2015   Procedure: ESOPHAGOGASTRODUODENOSCOPY (EGD);  Surgeon: Clarene Essex, MD;  Location: [facility] ENDOSCOPY;  Service: Endoscopy;  Laterality: N/A;  . ESOPHAGOGASTRODUODENOSCOPY N/A 03/29/2016   Procedure: ESOPHAGOGASTRODUODENOSCOPY (EGD);  Surgeon: Clarene Essex, MD;  Location: [facility] ENDOSCOPY;  Service: Endoscopy;  Laterality: N/A;  . FRACTURE SURGERY Right 2005   clavicle  . GIVENS CAPSULE STUDY N/A 06/30/2015   Procedure: GIVENS CAPSULE STUDY;  Surgeon: Clarene Essex, MD;  Location: [facility] ENDOSCOPY;  Service: Endoscopy;  Laterality: N/A;  . GIVENS CAPSULE STUDY N/A 06/29/2015   Procedure: GIVENS CAPSULE STUDY;  Surgeon: Clarene Essex, MD;  Location: [place];  Service: Endoscopy;  Laterality: N/A;  . Hysteroscopy with endometrial ablation  06/2001   for persistent post-menopausal bleeding // by S. [name], M.D.  . IR GENERIC HISTORICAL  08/23/2015   IR RADIOLOGIST EVAL & MGMT 08/23/2015 Aletta Edouard, MD [facility] INTERV RAD  .  IR GENERIC HISTORICAL  04/09/2016   IR RADIOLOGIST EVAL & MGMT 04/09/2016 Aletta Edouard, MD [facility] INTERV RAD  . LEFT HEART CATH AND CORONARY ANGIOGRAPHY N/A 04/21/2016   Procedure: Left Heart Cath and Coronary Angiography;  Surgeon: Lorretta Harp, MD;  Location: [place] CV LAB;  Service: Cardiovascular;  Laterality: N/A;  . LIPOMA EXCISION  08/2005   occipital lipoma 1.5cm - by Dr. [name]  . MAZE Left 09/20/10   for paroxysmal atrial fibrillation (Dr. Prescott Gum)  .  MITRAL VALVE REPLACEMENT  09/20/10    with a 27-mm pericardial porcine valve (Medtronic Mosaic valve, serial #[id number]). 09/20/10, Dr Prescott Gum  . ORIF CLAVICLE FRACTURE  01/2004   by Thana Farr. [name], M.D for Right clavicle nonunion.  Marland Kitchen RIGHT HEART CATH N/A 04/23/2016   Procedure: Right Heart Cath;  Surgeon: [name], MD;  Location: [place] CV LAB;  Service: Cardiovascular;  Laterality: N/A;  . TONSILLECTOMY    . TUBAL LIGATION     Social History:   reports that she has been smoking Cigarettes.  She started smoking about 53 years ago. She has a 53.00 pack-year smoking history. She has never used smokeless tobacco. She reports that she does not drink alcohol or use drugs.  Family History  Problem Relation Age of Onset  . Peptic Ulcer Disease Father   . Heart attack Father 59    Died of MI at age 45  . Heart attack Brother 35    Died of MI at age 48  . Obesity Brother   . Pneumonia Mother   . Healthy Sister   . Lupus Daughter   . Obsessive Compulsive Disorder Daughter     Medications: Allergies as of 04/29/2016      Reactions   Morphine And Related Other (See Comments)   Injection site reaction   Oxycontin [oxycodone] Other (See Comments)   headache   Tramadol Hcl Swelling   Ankle swelling   Lorazepam Other (See Comments)   Patient's sister noted that ativan caused the patient to become extremely confused during hospitalization 09/2010; tolerates Xanax      Medication List       Accurate as of 04/29/16  2:23 PM. Always use your most recent med list.          ACCU-CHEK FASTCLIX LANCET Kit Check your blood 4 times a day dx code 250.00 insulin requiring   albuterol 108 (90 Base) MCG/ACT inhaler Commonly known as:  PROVENTIL HFA;VENTOLIN HFA Inhale 2 puffs into the lungs every 6 (six) hours as needed for wheezing or shortness of breath.   ALPRAZolam 0.5 MG tablet Commonly known as:  XANAX Take 0.5 mg by mouth at bedtime as needed for anxiety.   aspirin 81 MG  chewable tablet Chew 1 tablet (81 mg total) by mouth daily.   chlorpheniramine 4 MG tablet Commonly known as:  CHLOR-TRIMETON Take 1 tablet (4 mg total) by mouth 3 (three) times daily as needed for allergies.   cyclobenzaprine 5 MG tablet Commonly known as:  FLEXERIL Take 1 tablet (5 mg total) by mouth 3 (three) times daily as needed for muscle spasms.   EASY TOUCH PEN NEEDLES 31G X 5 MM Misc Generic drug:  Insulin Pen Needle USE TO INJECT INSULIN 5 TIMES DAILY   ferrous gluconate 324 MG tablet Commonly known as:  FERGON Take 1 tablet (324 mg total) by mouth 2 (two) times daily with a meal.   fluconazole 150 MG tablet Commonly known as:  DIFLUCAN Take 150 mg by mouth daily. Stop date 04/30/16   FLUoxetine 20 MG tablet Commonly known as:  PROZAC Take 2 tablets (40 mg total) by mouth daily.   fluticasone 50 MCG/ACT nasal spray Commonly known as:  FLONASE Place 2 sprays into both nostrils daily.   Fluticasone-Salmeterol 500-50 MCG/DOSE Aepb Commonly known as:  ADVAIR DISKUS Inhale 1 puff into the lungs 2 (two) times daily.   furosemide 80 MG tablet Commonly known as:  LASIX Take 1 tablet (80 mg total) by mouth daily.   gabapentin 300 MG capsule Commonly known as:  NEURONTIN Take 2 capsules (600 mg total) by mouth 3 (three) times daily.   HUMALOG 100 UNIT/ML injection Generic drug:  insulin lispro Inject 0-5 Units into the skin 4 (four) times daily -  before meals and at bedtime. 7-120 = 0 units, 121-150 = 0 units, 151-200 = 0 units, 201-250 = 2 units, 251-300 = 3 units, 301-350 = 4 units, 351-400 = 5 units >400 CALL MD   HYDROcodone-acetaminophen 5-325 MG tablet Commonly known as:  NORCO/VICODIN Take 1-2 tablets by mouth every 6 (six) hours as needed for severe pain.   Insulin Detemir 100 UNIT/ML Pen Commonly known as:  LEVEMIR FLEXTOUCH Inject 35 Units into the skin daily at 10 pm.   Insulin Syringe-Needle U-100 31G X 15/64" 0.5 ML Misc Use to inject insulin up to  4 times a day   metoprolol succinate 25 MG 24 hr tablet Commonly known as:  TOPROL-XL Take 1 tablet (25 mg total) by mouth daily.   nicotine 21 mg/24hr patch Commonly known as:  NICODERM CQ - dosed in mg/24 hours Place 1 patch (21 mg total) onto the skin daily.   NOVOLOG 100 UNIT/ML injection Generic drug:  insulin aspart USE TO FILL VGO40 TO WEAR DAILY.   nystatin cream Commonly known as:  MYCOSTATIN APPLY TO AFFECTED AREA TWICE DAILY   omeprazole 40 MG capsule Commonly known as:  PRILOSEC Take 1 capsule (40 mg total) by mouth 2 (two) times daily.   ONETOUCH VERIO test strip Generic drug:  glucose blood Check blood sugar up to 4 times a day   ONETOUCH VERIO w/Device Kit 1 each by Does not apply route 4 (four) times daily.   OXYGEN Inhale 2 L into the lungs as needed (for shortness of breath while active).   potassium chloride SA 20 MEQ tablet Commonly known as:  K-DUR,KLOR-CON TAKE 1 TABLET BY MOUTH TWICE DAILY   PRESCRIPTION MEDICATION Inhale into the lungs See admin instructions. CPAP - use whenever sleeping   promethazine 12.5 MG tablet Commonly known as:  PHENERGAN Take 1 tablet (12.5 mg total) by mouth every 8 (eight) hours as needed for nausea or vomiting.   rosuvastatin 20 MG tablet Commonly known as:  CRESTOR TAKE 1 TABLET BY MOUTH EVERY NIGHT AT BEDTIME   SPIRIVA HANDIHALER 18 MCG inhalation capsule Generic drug:  tiotropium INHALE THE CONTENTS OF 1 CAPSULE VIA HANDIHALER BY MOUTH EVERY DAY   V-GO 40 Kit FILL AND PLACE VGO40 ABOUT THE SAME TIME EACH DAY. USE 4 TO 5 CLICKS WITH MEALS AND 2 CLICKS WITH SNACKS       Immunizations: Immunization History  Administered Date(s) Administered  . Influenza Split 11/04/2010, 11/25/2011  . Influenza Whole 11/13/2006, 12/27/2007, 10/23/2008, 11/23/2009  . Influenza,inj,Quad PF,36+ Mos 10/06/2012, 10/07/2013, 10/06/2014, 11/16/2015  . Pneumococcal Conjugate-13 02/17/2014  . Pneumococcal Polysaccharide-23  03/26/2009, 08/08/2015  . Td 10/19/2006  . Zoster 12/03/2011     Physical  Exam: Vitals:   04/29/16 1412  BP: (!) 132/93  Pulse: (!) 59  Resp: 20  Temp: 97 F (36.1 C)  TempSrc: Oral  SpO2: 98%  Weight: 188 lb 6.4 oz (85.5 kg)  Height: 5' 2" (1.575 m)   Body mass index is 34.46 kg/m.  General- elderly female, obese, in no acute distress Head- normocephalic, atraumatic Nose- no nasal discharge Throat- moist mucus membrane Eyes- PERRLA, EOMI, no pallor, no icterus, no discharge, normal conjunctiva, normal sclera Neck- no cervical lymphadenopathy Cardiovascular- normal s1,s2, no murmur Respiratory- bilateral clear to auscultation, no wheeze, no rhonchi, no crackles, no use of accessory muscles Abdomen- bowel sounds present, soft, non tender, no guarding or rigidity Musculoskeletal- able to move all 4 extremities, generalized weakness Neurological- alert and oriented to person, place and time Skin- warm and dry Psychiatry- normal mood and affect    Labs reviewed: Basic Metabolic Panel:  Recent Labs  04/23/16 0505 04/24/16 0505 04/25/16 0932  NA 135 132* 133*  K 3.8 4.2 3.8  CL 100* 96* 96*  CO2 _0 GLUCOSE 230* 225* 309*  BUN 21* 26* 25*  CREATININE 1.04* 1.18* 1.10*  CALCIUM 9.3 9.7 9.1   Liver Function Tests:  Recent Labs  06/28/15 0241 03/27/16 1645  AST 79* 17  ALT 72* 12*  ALKPHOS 84 41  BILITOT 1.5* 0.3  PROT 5.6* 6.2*  ALBUMIN 2.7* 3.3*    Recent Labs  03/27/16 1645  LIPASE 11   No results for input(s): AMMONIA in the last 8760 hours. CBC:  Recent Labs  06/27/15 1139  09/28/15 1156  04/22/16 0427 04/23/16 0505 04/24/16 0505  WBC 9.0  < > 6.0  < > 7.0 6.8 7.5  NEUTROABS 6.9  --  3.7  --   --   --   --   HGB 3.6*  < >  --   < > 8.7* 9.4* 9.9*  HCT 13.5*  < > 31.2*  < > 29.2* 30.8* 32.1*  MCV 68.2*  < > 85  < > 93.6 91.9 90.9  PLT 329  < > 203  < > 169 166 177  < > = values in this interval not displayed. Cardiac  Enzymes:  Recent Labs  04/19/16 0428 04/19/16 1039 04/20/16 0912  TROPONINI 2.90* 4.98* 2.35*   BNP: Invalid input(s): POCBNP CBG:  Recent Labs  04/25/16 0747 04/25/16 1132 04/25/16 1624  GLUCAP 302* 237* 244*    Radiological Exams: Dg Chest 2 View  Result Date: 04/22/2016 CLINICAL DATA:  Chest pain and dry cough EXAM: CHEST  2 VIEW COMPARISON:  04/18/2016 FINDINGS: Cardiac shadow is within normal limits. Postsurgical changes are again seen. Aortic calcifications are again noted. Multiple right rib fractures are again seen with healing. Chronic blunting of left costophrenic angle is noted. No focal infiltrate or sizable effusion is seen. IMPRESSION: No acute abnormality noted. Electronically Signed   By: Inez Catalina M.D.   On: 04/22/2016 17:49   Dg Chest 2 View  Result Date: 04/18/2016 CLINICAL DATA:  Mid chest pain x2 hours for chest swelling. EXAM: CHEST  2 VIEW COMPARISON:  06/27/2015 FINDINGS: Stable cardiomegaly. The aorta is atherosclerotic without aneurysm. Mild interstitial edema without pneumonic consolidation, effusion or pneumothorax. The patient is status post median sternotomy. Old right-sided rib fractures are noted with healing. Plate and screw fixation of the mid clavicle. Vascular clips are seen at along the right side of the neck. IMPRESSION: Mild interstitial edema. Aortic atherosclerosis. Multiple chronic right-sided rib  fractures. Plate and screw fixation of the mid right clavicle. Electronically Signed   By: Ashley Royalty M.D.   On: 04/18/2016 22:09   Ct Head Wo Contrast  Result Date: 04/18/2016 CLINICAL DATA:  Headache and left upper extremity pain for 2 weeks. EXAM: CT HEAD WITHOUT CONTRAST TECHNIQUE: Contiguous axial images were obtained from the base of the skull through the vertex without intravenous contrast. COMPARISON:  01/07/2013 FINDINGS: Brain: There is no intracranial hemorrhage, mass or evidence of acute infarction. There is no extra-axial fluid  collection. Gray matter and white matter appear normal. Cerebral volume is normal for age. Brainstem and posterior fossa are unremarkable. The CSF spaces appear normal. Vascular: No hyperdense vessel or unexpected calcification. Skull: Normal. Negative for fracture or focal lesion. Sinuses/Orbits: No acute finding. Other: None. IMPRESSION: Normal brain Electronically Signed   By: Andreas Newport M.D.   On: 04/18/2016 22:11   Nm Pulmonary Perf And Vent  Result Date: 04/22/2016 CLINICAL DATA:  History of COPD, chest pain EXAM: NUCLEAR MEDICINE VENTILATION - PERFUSION LUNG SCAN TECHNIQUE: Ventilation images were obtained in multiple projections using inhaled aerosol Tc-52mDTPA. Perfusion images were obtained in multiple projections after intravenous injection of Tc-965mAA. RADIOPHARMACEUTICALS:  33 mCi Technetium-998mPA aerosol inhalation and 4.15 mCi Technetium-65m66m IV COMPARISON:  Radiograph 04/22/2016 FINDINGS: Ventilation: Heterogeneous ventilation, consistent with obstructive airways disease. Perfusion: Mild heterogeneous perfusion, but no large wedge-shaped peripheral defects. Heterogenous perfusion is in the general distribution of the heterogeneous lung ventilation. IMPRESSION: 1. Overall low probability study for pulmonary embolus given heterogenous perfusion. 2. Heterogenous ventilation, consistent with obstructive airways disease. Electronically Signed   By: Kim Donavan Foil.   On: 04/22/2016 20:25   Ir Radiologist Eval & Mgmt  Result Date: 04/18/2016 Please refer to notes tab for details about interventional procedure. (Op Note)    Assessment/Plan  Physical deconditioning Will have her work with physical therapy and occupational therapy team to help with gait training and muscle strengthening exercises.fall precautions. Skin care. Encourage to be out of bed.   NSTEMI Remains chest pain free. Continue aspirin, metoprolol succinate 25 mg daily, rosuvastatin 20 mg daily. To f/u with  cardiology  Pulmonary HTN Monitor clinically, to use her CPAP, monitor breathing.   afib Controlled HR. Continue aspirin 81 mg daily and metoprolol succinate 25 mg daily  Chronic diastolic chf Continue lasix and metoprolol, monitor clinically  Iron def anemia Check cbc, continue fergon bid  Vaginal candidiasis Recently completed antibiotic for UTI. Currently on 1 time dosing of diflucan 150 mg x 1. Monitor clinically and maintain perineal hygiene. Send u/a with c/s  OSA Continue CPAP  COPD Breathing currently stable, on spiriva, advair and prn proventil, continue her bronchodilators. duoneb tid  scheduled for 5 days and monitor  gerd Intermittent symptom, continue omeprazole 40 mg bid, advised to sit upright post meals  Type 2 diabetes with neuropathy Lab Results  Component Value Date   HGBA1C 7.6 12/14/2015   Check a1c. Continue levemir 35 u daily. Per patient was on levemir 45 u daily, novolog 18 u tid with meals with additional sliding scale insulin coverage. Continue gabapentin. No cbg available for review as it has not been checked. cbg check qac and qhs for now. Continue current SSI and adjust further if needed   Chronic low back pain On norco 5-325 mg 1-2 tab q6h prn pain and cyclobenzaprine 5 mg tid prn. Monitor clinically   Goals of care: short term rehabilitation   Labs/tests ordered: cbc, bmp,  a1c  Family/ staff Communication: reviewed care plan with patient and nursing supervisor    Blanchie Serve, MD Internal Medicine [place], [place] [address] Cell Phone (Monday-Friday 8 am - 5 pm): [phone] On Call: [phone] and follow prompts after 5 pm and on weekends Office Phone: [phone] Office Fax: [phone]

## 2016-04-30 ENCOUNTER — Telehealth: Payer: Self-pay | Admitting: Dietician

## 2016-04-30 LAB — HEMOGLOBIN A1C: Hemoglobin A1C: 6.4

## 2016-04-30 NOTE — Telephone Encounter (Signed)
Called for support while in nursing home. Support provided.

## 2016-05-02 ENCOUNTER — Encounter: Payer: Self-pay | Admitting: *Deleted

## 2016-05-02 ENCOUNTER — Other Ambulatory Visit: Payer: Self-pay | Admitting: *Deleted

## 2016-05-02 ENCOUNTER — Ambulatory Visit: Payer: PPO | Admitting: Pharmacist

## 2016-05-02 NOTE — Patient Outreach (Signed)
[facility]) Care Management  05/02/2016  [name] 04-09-1950 [id number]   CSW was able to make contact with patient today to perform the initial assessment, as well as assess and assist with social work needs and services, when [name] met with patient at [facility] [place] where patient currently resides to receive short-term rehabilitative services.  Patient is already familiar with CSW, as she has worked extensively with [facility] in the past.  CSW explained the reason for the visit, indicating that CSW would be following patient while at the skilled nursing facility to assess and assist with discharge planning needs and services.  CSW obtained two HIPAA compliant identifiers from patient, which included patient's name and date of birth. Patient admitted that she has a "Goals of Care Meeting" scheduled for Monday, March 26th, but that the only people allowed to attend the meeting are her therapists at [facility], both physical and occupational), the director of nursing, admissions coordinator, her sister, [name] and herself, as CSW had expressed an interest in attending.  Patient stated, "I know they want to talk about sending me away somewhere, but I ain't having it, I'm going home".  CSW inquired as to whether or not patient felt that she would be able to adequately care for herself in the home, as patient admits to recently suffering a heart attack and "not properly caring for herself prior to being admitted into the hospital". As the conversation progressed, patient adamantly refused placement in an assisted living facility for long-term care services, but was agreeable to having CSW assist her, along with the discharge planning coordinator at the facility, with arranging home health services, as well as durable medical equipment, if recommended.  CSW will also arrange for patient to receive [name] through [employer] [facility].  CSW spoke with patient about transportation resources, but patient denied wanting to utilize public transportation, willing to solely rely on her Mrs. [name] to transport her to and from all her physician appointments. Patient refuses to quit smoking, admitting that she will "never quit", as she enjoys it too much.  Patient was not interested in counseling and supportive services at this time, explaining that she just wanted to try and get more sleep, as she has not been sleeping well at the facility.  CSW encouraged patient to contact CSW after her "Goals of Care Meeting" on Monday, so that she can report findings. If CSW does not receive a return call from patient on Monday, CSW will plan to outreach to patient in person on Tuesday, April 3rd, by visiting her at [facility]. [name], BSW, MSW, LCSW  Licensed Education officer, [facility]  Mailing [place] [address], [place], [place] [address] Physical Address-[address], [place], [place] [address] Toll Free Main # [phone] Fax # [phone] Cell # [phone]  Office # [phone] [email or web address]

## 2016-05-06 ENCOUNTER — Non-Acute Institutional Stay (SKILLED_NURSING_FACILITY): Payer: PPO | Admitting: Adult Health

## 2016-05-06 ENCOUNTER — Encounter: Payer: Self-pay | Admitting: Adult Health

## 2016-05-06 DIAGNOSIS — E1165 Type 2 diabetes mellitus with hyperglycemia: Secondary | ICD-10-CM | POA: Diagnosis not present

## 2016-05-06 DIAGNOSIS — B373 Candidiasis of vulva and vagina: Secondary | ICD-10-CM | POA: Diagnosis not present

## 2016-05-06 DIAGNOSIS — D509 Iron deficiency anemia, unspecified: Secondary | ICD-10-CM

## 2016-05-06 DIAGNOSIS — E1169 Type 2 diabetes mellitus with other specified complication: Secondary | ICD-10-CM

## 2016-05-06 DIAGNOSIS — Z794 Long term (current) use of insulin: Secondary | ICD-10-CM

## 2016-05-06 DIAGNOSIS — IMO0002 Reserved for concepts with insufficient information to code with codable children: Secondary | ICD-10-CM

## 2016-05-06 DIAGNOSIS — B3731 Acute candidiasis of vulva and vagina: Secondary | ICD-10-CM

## 2016-05-06 NOTE — Progress Notes (Signed)
Patient ID: [name], female   DOB: Jun 20, 1950, 66 y.o.   MRN: [id number]    DATE:  05/06/2016   MRN:  [id number]  BIRTHDAY: Mar 20, 1950  Facility:  Nursing Home Location:  [facility]  Nursing Home Room Number: 401-P  LEVEL OF CARE:  SNF (31)  Contact Information    Name Relation Home Work [place] Sister [phone]  [phone]   Miller,[name] Daughter   [phone]       Code Status History    Date Active Date Inactive Code Status Order ID Comments User Context   04/19/2016  4:21 AM 04/25/2016 10:11 PM Full Code [id number]  Gwynn Burly, DO Inpatient   03/27/2016  9:41 PM 03/29/2016  9:05 PM Full Code [id number]  Denton Brick, MD ED   06/27/2015  1:52 PM 07/03/2015  6:52 PM Full Code [id number]  Hyacinth Meeker, MD ED   12/26/2014  6:13 PM 12/27/2014  9:36 PM Full Code [id number]  Yolanda Manges, DO Inpatient   10/20/2014 12:41 AM 10/26/2014  2:46 PM Full Code [id number]  Aldean Baker, MD ED   10/14/2014  6:27 AM 10/16/2014  4:05 PM Full Code [id number]  Aldean Baker, MD Inpatient   01/16/2014  5:50 PM 01/18/2014  6:49 PM Full Code [id number]  Yolanda Manges, DO Inpatient   01/07/2013  6:38 PM 01/10/2013  6:03 PM Full Code [id number]  Annett Gula, MD Inpatient   12/03/2012 11:56 AM 12/04/2012  4:40 PM Full Code [id number]  Lorretta Harp, MD Inpatient   05/04/2012  4:08 AM 05/07/2012  4:23 PM Full Code [id number]  Elyse Jarvis, MD Inpatient       Chief Complaint  Patient presents with  . Acute Visit    Hyperglycemia, vaginal candidiasis     HISTORY OF PRESENT ILLNESS:  This is a 66-YO female who was given Diflucan due to vaginal candidal infection but continues to have vaginal infection. Noted CBGs are elevated - 379, 236, 302, 316, 380, 400. She verbalized that she has been diabetic for 20 years and has a different home  Regimen for sliding scale.  She was admitted to [facility] for short-term rehabilitation on 04/25/2016  following an admission at [facility] 04/18/2016-04/25/2016 for NSTEMI.  She was started on Heparin drip until 3/12 when she had left heart catheterization and coronary angiography. NSTEMI was thought to be due to demand ischemia in setting of RV dysfunction or anemia.  She was started on Metoprolol and continued on ASA and Rosuvastatin. She has PMH of bioprosthetic MVR 2012, IDDM, COPD and tobacco use.   PAST MEDICAL HISTORY:  Past Medical History:  Diagnosis Date  . Adenomatous polyps 05/14/2011   Colonoscopy (05/2011): 4 mm adenomatous polyp excised endoscopically Colonoscopy (02/2002): Adenomatous polyp excised endoscopically   . Allergic rhinitis 06/01/2012  . Anemia of chronic disease 01/01/2013  . Anxiety 07/24/2010  . Aortic atherosclerosis (HCC) 10/19/2014   Seen on CT scan, currently asymptomatic  . 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 Asymptomatic cholelithiasis 09/25/2015   Seen on CT scan 08/2015  . Bilateral cataracts 08/04/2012   Visually insignificant   . Carotid artery stenosis    s/p right endarterectomy (06/2010) Carotid US (07/2010):  Left: Moderate-to-severe (60-79%) calcific and non-calcific plaque origin and proximal ICA and ECA   . Chronic congestive heart failure with left ventricular diastolic dysfunction (HCC) 10/21/2010  . Chronic constipation  02/03/2011  . Chronic daily headache 01/16/2014  . Chronic low back pain 10/06/2012  . Chronic venous insufficiency 08/04/2012  . Clear cell renal cell carcinoma (HCC) 07/21/2011   s/p cryoablation of left RCC in 09/2011 by Dr. Fredia Sorrow. Followed by Dr. Retta Diones  [facility]) .    Marland Kitchen COPD (chronic obstructive pulmonary disease) with emphysema (HCC)    PFTs 02/2012: FEV1 0.92 (40%), ratio 69, 27% increase in FEV1 with BD, TLC 91%, severe airtrapping, DLCO49% On chronic home O2. Pulmonary rehab referral 05/2012   . Depression 11/19/2005  . Esophageal  stenosis 08/08/2015   Mild, benign-appearing on EGD 06/29/2015  . Fibromyalgia 08/29/2010  . Gastroesophageal reflux disease   . History of blood transfusion    "several times"   . Hyperlipidemia LDL goal < 100 11/20/2005  . Internal hemorrhoids 08/04/2012  . Mitral stenosis    s/p Mitral valve replacement with a 27-mm pericardial porcine valve (Medtronic Mosaic valve, serial #[id number] on 09/20/10, Dr. Donata Clay)   . Moderate to severe pulmonary hypertension    2014 TEE w PA peak pressure 46 mmHg, s/p MV replacement   . Obesity (BMI 30.0-34.9) 10/23/2011  . Obstructive sleep apnea    Nocturnal polysomnography (06/2009): Moderate sleep apnea/ hypopnea syndrome , AHI 17.8 per hour with nonpositional hypopneas. CPAP titration to 12 CWP, AHI 2.4 per hour. On nocturnal CPAP via a small resMed Quattro full-face mask with heated humidifier.   . Osteoporosis    DEXA (12/09/2011): L-spine T -3.7, left hip T -1.4 DEXA (12/2004): L-spine T -2.6, left hip -0.1   . Paroxysmal atrial fibrillation (HCC) 10/22/2010   s/p Left atrial maze procedure for paroxysmal atrial fibrillation on 09/20/2010 by Dr Donata Clay.  Subsequent splenic infarct, decision was made to re-anticoagulate with coumadin, likely life-long as this is the most likely cause of the splenic infarct.   . Right nephrolithiasis 09/06/2014   5 mm non-obstructing calculus seen on CT scan 09/05/2014   . Seborrheic keratosis 09/28/2015  . Shortness of breath dyspnea   . Tobacco abuse 07/28/2012  . Type 2 diabetes mellitus with diabetic neuropathy (HCC)      CURRENT MEDICATIONS: Reviewed  Patient's Medications  New Prescriptions   No medications on file  Previous Medications   ALBUTEROL (PROVENTIL HFA;VENTOLIN HFA) 108 (90 BASE) MCG/ACT INHALER    Inhale 2 puffs into the lungs every 6 (six) hours as needed for wheezing or shortness of breath.   ALPRAZOLAM (XANAX) 1 MG TABLET    Take 1 mg by mouth 2 (two) times daily as needed for anxiety.   ASPIRIN  81 MG CHEWABLE TABLET    Chew 1 tablet (81 mg total) by mouth daily.   BLOOD GLUCOSE MONITORING SUPPL (ONETOUCH VERIO) W/DEVICE KIT    1 each by Does not apply route 4 (four) times daily.   CHLORPHENIRAMINE (CHLOR-TRIMETON) 4 MG TABLET    Take 1 tablet (4 mg total) by mouth 3 (three) times daily as needed for allergies.   CYCLOBENZAPRINE (FLEXERIL) 5 MG TABLET    Take 1 tablet (5 mg total) by mouth 3 (three) times daily as needed for muscle spasms.   EASY TOUCH PEN NEEDLES 31G X 5 MM MISC    USE TO INJECT INSULIN 5 TIMES DAILY   FERROUS SULFATE 325 (65 FE) MG TABLET    Take 325 mg by mouth 2 (two) times daily with a meal.   FLUOXETINE (PROZAC) 40 MG CAPSULE    Take 40 mg by mouth daily.  FLUTICASONE (FLONASE) 50 MCG/ACT NASAL SPRAY    Place 2 sprays into both nostrils daily.   FLUTICASONE-SALMETEROL (ADVAIR DISKUS) 500-50 MCG/DOSE AEPB    Inhale 1 puff into the lungs 2 (two) times daily.   FUROSEMIDE (LASIX) 80 MG TABLET    Take 1 tablet (80 mg total) by mouth daily.   GABAPENTIN (NEURONTIN) 300 MG CAPSULE    Take 2 capsules (600 mg total) by mouth 3 (three) times daily.   GUAIFENESIN (MUCINEX) 600 MG 12 HR TABLET    Take 600 mg by mouth 2 (two) times daily.   HYDROCODONE-ACETAMINOPHEN (NORCO/VICODIN) 5-325 MG TABLET    Take 1-2 tablets by mouth every 6 (six) hours as needed for severe pain.   INSULIN DETEMIR (LEVEMIR) 100 UNIT/ML INJECTION    Inject 40 Units into the skin at bedtime.   INSULIN DISPOSABLE PUMP (V-GO 40) KIT    FILL AND PLACE VGO40 ABOUT THE SAME TIME EACH DAY. USE 4 TO 5 CLICKS WITH MEALS AND 2 CLICKS WITH SNACKS   INSULIN LISPRO (HUMALOG) 100 UNIT/ML INJECTION    Inject 0-5 Units into the skin 4 (four) times daily -  before meals and at bedtime. 7-120 = 0 units, 121-150 = 0 units, 151-200 = 0 units, 201-250 = 2 units, 251-300 = 3 units, 301-350 = 4 units, 351-400 = 5 units >400 CALL MD   INSULIN SYRINGE-NEEDLE U-100 31G X 15/64" 0.5 ML MISC    Use to inject insulin up to 4 times a  day   LANCETS MISC. (ACCU-CHEK FASTCLIX LANCET) KIT    Check your blood 4 times a day dx code 250.00 insulin requiring   METOPROLOL SUCCINATE (TOPROL-XL) 25 MG 24 HR TABLET    Take 1 tablet (25 mg total) by mouth daily.   NICOTINE (NICODERM CQ - DOSED IN MG/24 HOURS) 21 MG/24HR PATCH    Place 1 patch (21 mg total) onto the skin daily.   NOVOLOG 100 UNIT/ML INJECTION    USE TO FILL VGO40 TO WEAR DAILY.   NYSTATIN-TRIAMCINOLONE (MYCOLOG II) CREAM    Apply 1 application topically 2 (two) times daily. Apply to abdominal folds and right breast BID x3 weeks   OMEPRAZOLE (PRILOSEC) 40 MG CAPSULE    Take 1 capsule (40 mg total) by mouth 2 (two) times daily.   ONETOUCH VERIO TEST STRIP    Check blood sugar up to 4 times a day   OXYGEN    Inhale 2 L into the lungs as needed (for shortness of breath while active).   POTASSIUM CHLORIDE SA (K-DUR,KLOR-CON) 20 MEQ TABLET    TAKE 1 TABLET BY MOUTH TWICE DAILY   PRESCRIPTION MEDICATION    Inhale into the lungs See admin instructions. CPAP - use whenever sleeping   PROMETHAZINE (PHENERGAN) 12.5 MG TABLET    Take 1 tablet (12.5 mg total) by mouth every 8 (eight) hours as needed for nausea or vomiting.   ROSUVASTATIN (CRESTOR) 20 MG TABLET    TAKE 1 TABLET BY MOUTH EVERY NIGHT AT BEDTIME   SPIRIVA HANDIHALER 18 MCG INHALATION CAPSULE    INHALE THE CONTENTS OF 1 CAPSULE VIA HANDIHALER BY MOUTH EVERY DAY  Modified Medications   No medications on file  Discontinued Medications   ALPRAZOLAM (XANAX) 0.5 MG TABLET    Take 0.5 mg by mouth at bedtime as needed for anxiety.   FERROUS GLUCONATE (FERGON) 324 MG TABLET    Take 1 tablet (324 mg total) by mouth 2 (two) times daily with a meal.  FLUCONAZOLE (DIFLUCAN) 150 MG TABLET    Take 150 mg by mouth daily. Stop date 04/30/16   FLUOXETINE (PROZAC) 20 MG TABLET    Take 2 tablets (40 mg total) by mouth daily.   INSULIN DETEMIR (LEVEMIR FLEXTOUCH) 100 UNIT/ML PEN    Inject 35 Units into the skin daily at 10 pm.   NYSTATIN  CREAM (MYCOSTATIN)    APPLY TO AFFECTED AREA TWICE DAILY     Allergies  Allergen Reactions  . Morphine And Related Other (See Comments)    Injection site reaction  . Oxycontin [Oxycodone] Other (See Comments)    headache  . Tramadol Hcl Swelling    Ankle swelling  . Lorazepam Other (See Comments)    Patient's sister noted that ativan caused the patient to become extremely confused during hospitalization 09/2010; tolerates Xanax     REVIEW OF SYSTEMS:  GENERAL: no change in appetite, no fatigue, no weight changes, no fever, chills or weakness EYES: Denies change in vision, dry eyes, eye pain, itching or discharge EARS: Denies change in hearing, ringing in ears, or earache NOSE: Denies nasal congestion or epistaxis MOUTH and THROAT: Denies oral discomfort, gingival pain or bleeding, pain from teeth or hoarseness   RESPIRATORY: no cough, SOB, DOE, wheezing, hemoptysis CARDIAC: no chest pain, edema or palpitations GI: no abdominal pain, diarrhea, constipation, heart burn, nausea or vomiting GU: Denies dysuria, frequency, hematuria, incontinence, or discharge PSYCHIATRIC: Denies feeling of depression or anxiety. No report of hallucinations, insomnia, paranoia, or agitation    PHYSICAL EXAMINATION  GENERAL APPEARANCE: Well nourished. In no acute distress. Obese SKIN:  Erythematous  vaginal orifice and perineum HEAD: Normal in size and contour. No evidence of trauma EYES: Lids open and close normally. No blepharitis, entropion or ectropion. PERRL. Conjunctivae are clear and sclerae are white. Lenses are without opacity EARS: Pinnae are normal. Patient hears normal voice tunes of the examiner MOUTH and THROAT: Lips are without lesions. Oral mucosa is moist and without lesions. Tongue is normal in shape, size, and color and without lesions NECK: supple, trachea midline, no neck masses, no thyroid tenderness, no thyromegaly LYMPHATICS: no LAN in the neck, no supraclavicular  LAN RESPIRATORY: breathing is even & unlabored, BS CTAB CARDIAC: RRR, no murmur,no extra heart sounds, no edema GI: abdomen soft, normal BS, no masses, no tenderness, no hepatomegaly, no splenomegaly EXTREMITIES:  Able to move X 4 extremities  PSYCHIATRIC: Alert and oriented X 3. Affect and behavior are appropriate  LABS/RADIOLOGY: Labs reviewed: Basic Metabolic Panel:  Recent Labs  19/14/78 0505 04/24/16 0505 04/25/16 0932 04/29/16  NA 135 132* 133* 138  K 3.8 4.2 3.8 4.8  CL 100* 96* 96*  --   CO2 27 27 27   --   GLUCOSE 230* 225* 309*  --   BUN 21* 26* 25* 30*  CREATININE 1.04* 1.18* 1.10* 1.2*  CALCIUM 9.3 9.7 9.1  --    Liver Function Tests:  Recent Labs  06/28/15 0241 03/27/16 1645  AST 79* 17  ALT 72* 12*  ALKPHOS 84 41  BILITOT 1.5* 0.3  PROT 5.6* 6.2*  ALBUMIN 2.7* 3.3*    Recent Labs  03/27/16 1645  LIPASE 11   CBC:  Recent Labs  06/27/15 1139  09/28/15 1156  04/22/16 0427 04/23/16 0505 04/24/16 0505 04/29/16  WBC 9.0  < > 6.0  < > 7.0 6.8 7.5 6.5  NEUTROABS 6.9  --  3.7  --   --   --   --  4  HGB 3.6*  < >  --   < > 8.7* 9.4* 9.9* 10.2*  HCT 13.5*  < > 31.2*  < > 29.2* 30.8* 32.1* 33*  MCV 68.2*  < > 85  < > 93.6 91.9 90.9  --   PLT 329  < > 203  < > 169 166 177 184  < > = values in this interval not displayed. Lipid Panel:  Recent Labs  04/19/16 1039  HDL 28*   Cardiac Enzymes:  Recent Labs  04/19/16 0428 04/19/16 1039 04/20/16 0912  TROPONINI 2.90* 4.98* 2.35*   CBG:  Recent Labs  04/25/16 0747 04/25/16 1132 04/25/16 1624  GLUCAP 302* 237* 244*      Dg Chest 2 View  Result Date: 04/22/2016 CLINICAL DATA:  Chest pain and dry cough EXAM: CHEST  2 VIEW COMPARISON:  04/18/2016 FINDINGS: Cardiac shadow is within normal limits. Postsurgical changes are again seen. Aortic calcifications are again noted. Multiple right rib fractures are again seen with healing. Chronic blunting of left costophrenic angle is noted. No focal  infiltrate or sizable effusion is seen. IMPRESSION: No acute abnormality noted. Electronically Signed   By: Alcide Clever M.D.   On: 04/22/2016 17:49   Dg Chest 2 View  Result Date: 04/18/2016 CLINICAL DATA:  Mid chest pain x2 hours for chest swelling. EXAM: CHEST  2 VIEW COMPARISON:  06/27/2015 FINDINGS: Stable cardiomegaly. The aorta is atherosclerotic without aneurysm. Mild interstitial edema without pneumonic consolidation, effusion or pneumothorax. The patient is status post median sternotomy. Old right-sided rib fractures are noted with healing. Plate and screw fixation of the mid clavicle. Vascular clips are seen at along the right side of the neck. IMPRESSION: Mild interstitial edema. Aortic atherosclerosis. Multiple chronic right-sided rib fractures. Plate and screw fixation of the mid right clavicle. Electronically Signed   By: Tollie Eth M.D.   On: 04/18/2016 22:09   Ct Head Wo Contrast  Result Date: 04/18/2016 CLINICAL DATA:  Headache and left upper extremity pain for 2 weeks. EXAM: CT HEAD WITHOUT CONTRAST TECHNIQUE: Contiguous axial images were obtained from the base of the skull through the vertex without intravenous contrast. COMPARISON:  01/07/2013 FINDINGS: Brain: There is no intracranial hemorrhage, mass or evidence of acute infarction. There is no extra-axial fluid collection. Gray matter and white matter appear normal. Cerebral volume is normal for age. Brainstem and posterior fossa are unremarkable. The CSF spaces appear normal. Vascular: No hyperdense vessel or unexpected calcification. Skull: Normal. Negative for fracture or focal lesion. Sinuses/Orbits: No acute finding. Other: None. IMPRESSION: Normal brain Electronically Signed   By: Ellery Plunk M.D.   On: 04/18/2016 22:11   Nm Pulmonary Perf And Vent  Result Date: 04/22/2016 CLINICAL DATA:  History of COPD, chest pain EXAM: NUCLEAR MEDICINE VENTILATION - PERFUSION LUNG SCAN TECHNIQUE: Ventilation images were obtained in  multiple projections using inhaled aerosol Tc-39m DTPA. Perfusion images were obtained in multiple projections after intravenous injection of Tc-51m MAA. RADIOPHARMACEUTICALS:  33 mCi Technetium-42m DTPA aerosol inhalation and 4.15 mCi Technetium-77m MAA IV COMPARISON:  Radiograph 04/22/2016 FINDINGS: Ventilation: Heterogeneous ventilation, consistent with obstructive airways disease. Perfusion: Mild heterogeneous perfusion, but no large wedge-shaped peripheral defects. Heterogenous perfusion is in the general distribution of the heterogeneous lung ventilation. IMPRESSION: 1. Overall low probability study for pulmonary embolus given heterogenous perfusion. 2. Heterogenous ventilation, consistent with obstructive airways disease. Electronically Signed   By: Jasmine Pang M.D.   On: 04/22/2016 20:25   Ir Radiologist Eval & Mgmt  Result Date:  04/18/2016 Please refer to notes tab for details about interventional procedure. (Op Note)   ASSESSMENT/PLAN:  Vaginal candida infection - give Diflucan 150 mg 1 tab PO X 1; encourage perineal hygiene  Uncontrolled diabetes mellitus - continue Levemir 100 units/ml 40 units SQ Q HS and change Humalog sliding scale to 150- 200 = 5 units, 200 - 250 units 10 units and 250 - 300 15 units; continue Levemir 100 units/ml 40 units SQ Q HS  Anemia, iron deficiency - continue Iron 325 mg 1 tab PO BID Lab Results  Component Value Date   HGB 10.2 (A) 04/29/2016        Jermall Isaacson C. Medina-Vargas - NP    [employer] [phone]

## 2016-05-07 ENCOUNTER — Encounter: Payer: Self-pay | Admitting: Adult Health

## 2016-05-07 ENCOUNTER — Non-Acute Institutional Stay (SKILLED_NURSING_FACILITY): Payer: PPO | Admitting: Adult Health

## 2016-05-07 DIAGNOSIS — E785 Hyperlipidemia, unspecified: Secondary | ICD-10-CM

## 2016-05-07 DIAGNOSIS — E1140 Type 2 diabetes mellitus with diabetic neuropathy, unspecified: Secondary | ICD-10-CM | POA: Diagnosis not present

## 2016-05-07 DIAGNOSIS — E876 Hypokalemia: Secondary | ICD-10-CM

## 2016-05-07 DIAGNOSIS — R5381 Other malaise: Secondary | ICD-10-CM

## 2016-05-07 DIAGNOSIS — I214 Non-ST elevation (NSTEMI) myocardial infarction: Secondary | ICD-10-CM

## 2016-05-07 DIAGNOSIS — F329 Major depressive disorder, single episode, unspecified: Secondary | ICD-10-CM | POA: Diagnosis not present

## 2016-05-07 DIAGNOSIS — I5032 Chronic diastolic (congestive) heart failure: Secondary | ICD-10-CM | POA: Diagnosis not present

## 2016-05-07 DIAGNOSIS — I480 Paroxysmal atrial fibrillation: Secondary | ICD-10-CM | POA: Diagnosis not present

## 2016-05-07 DIAGNOSIS — D509 Iron deficiency anemia, unspecified: Secondary | ICD-10-CM

## 2016-05-07 DIAGNOSIS — B373 Candidiasis of vulva and vagina: Secondary | ICD-10-CM

## 2016-05-07 DIAGNOSIS — Z720 Tobacco use: Secondary | ICD-10-CM | POA: Diagnosis not present

## 2016-05-07 DIAGNOSIS — B3731 Acute candidiasis of vulva and vagina: Secondary | ICD-10-CM

## 2016-05-07 DIAGNOSIS — J438 Other emphysema: Secondary | ICD-10-CM

## 2016-05-07 DIAGNOSIS — Z8719 Personal history of other diseases of the digestive system: Secondary | ICD-10-CM

## 2016-05-07 DIAGNOSIS — F419 Anxiety disorder, unspecified: Secondary | ICD-10-CM

## 2016-05-07 DIAGNOSIS — G4733 Obstructive sleep apnea (adult) (pediatric): Secondary | ICD-10-CM

## 2016-05-07 DIAGNOSIS — B372 Candidiasis of skin and nail: Secondary | ICD-10-CM | POA: Diagnosis not present

## 2016-05-07 NOTE — Telephone Encounter (Signed)
Called pt she states she is "ok" does not like the snf and was told she cannot come to [facility] til disch, she has company that just walked in so cannot answer questions but she will call if things change Transition Care Management Follow-up Telephone Call   Date discharged?"last Friday"   How have you been since you were released from the hospital? "ok"   Do you understand why you were in the hospital? yes   Do you understand the discharge instructions? yes   Where were you discharged to? snf, [facility]   Items Reviewed:  Medications reviewed: yes  Allergies reviewed: yes  Dietary changes reviewed: yes  Referrals reviewed: yes   Functional Questionnaire:   Activities of Daily Living (ADLs):   She states they are independent in the following: ambulation States they require assistance with the following: ambulation   Any transportation issues/concerns?: no   Any patient concerns? no   Confirmed importance and date/time of follow-up visits scheduled yes  Provider Appointment booked with will call when discharged  Confirmed with patient if condition begins to worsen call PCP or go to the ER.  Patient was given the office number and encouraged to call back with question or concerns.  : yes

## 2016-05-07 NOTE — Progress Notes (Signed)
DATE:  05/07/2016   MRN:  [id number]  BIRTHDAY: December 09, 1950  Facility:  Nursing Home Location:  [place] Room Number: 929-V  LEVEL OF CARE:  SNF (31)  Contact Information    Name Relation Home Work [place] Sister [phone]  [phone]   [place] Daughter   [phone]       Code Status History    Date Active Date Inactive Code Status Order ID Comments User Context   04/19/2016  4:21 AM 04/25/2016 10:11 PM Full Code [id number]  Jule Ser, DO Inpatient   03/27/2016  9:41 PM 03/29/2016  9:05 PM Full Code [id number]  Norman Herrlich, MD ED   06/27/2015  1:52 PM 07/03/2015  6:52 PM Full Code [id number]  Dellia Nims, MD ED   12/26/2014  6:13 PM 12/27/2014  9:36 PM Full Code [id number]  [name], DO Inpatient   10/20/2014 12:41 AM 10/26/2014  2:46 PM Full Code [id number]  Vickii Chafe, MD ED   10/14/2014  6:27 AM 10/16/2014  4:05 PM Full Code [id number]  Vickii Chafe, MD Inpatient   01/16/2014  5:50 PM 01/18/2014  6:49 PM Full Code [id number]  [name], DO Inpatient   01/07/2013  6:38 PM 01/10/2013  6:03 PM Full Code [id number]  Cresenciano Genre, MD Inpatient   12/03/2012 11:56 AM 12/04/2012  4:40 PM Full Code [id number]  Ivor Costa, MD Inpatient   05/04/2012  4:08 AM 05/07/2012  4:23 PM Full Code [id number]  Pedro Earls, MD Inpatient       Chief Complaint  Patient presents with  . Discharge Note    HISTORY OF PRESENT ILLNESS:  This is a 55-YO female who is for discharge home with Home health PT, OT, CNA and Nursing.  She was admitted to [facility] for short-term rehabilitation on 04/25/2016 following an admission at [facility] 04/18/2016-04/25/2016 for NSTEMI.  She was started on Heparin drip until 3/12 when she had left heart catheterization and coronary angiography. NSTEMI was thought to be due to demand ischemia in setting of RV dysfunction or anemia.  She was started on Metoprolol and continued on ASA and  Rosuvastatin. She has PMH of bioprosthetic MVR 2012, IDDM, COPD and tobacco use.  Patient was admitted to this facility for short-term rehabilitation after the patient's recent hospitalization.  Patient has completed SNF rehabilitation and therapy has cleared the patient for discharge.   PAST MEDICAL HISTORY:  Past Medical History:  Diagnosis Date  . Adenomatous polyps 05/14/2011   Colonoscopy (05/2011): 4 mm adenomatous polyp excised endoscopically Colonoscopy (02/2002): Adenomatous polyp excised endoscopically   . Allergic rhinitis 06/01/2012  . Anemia of chronic disease 01/01/2013  . Anxiety 07/24/2010  . Aortic atherosclerosis ([place]) 10/19/2014   Seen on CT scan, currently asymptomatic  . 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 Asymptomatic cholelithiasis 09/25/2015   Seen on CT scan 08/2015  . Bilateral cataracts 08/04/2012   Visually insignificant   . Carotid artery stenosis    s/p right endarterectomy (06/2010) Carotid US (07/2010):  Left: Moderate-to-severe (60-79%) calcific and non-calcific plaque origin and proximal ICA and ECA   . Chronic congestive heart failure with left ventricular diastolic dysfunction ([place]) 10/21/2010  . Chronic constipation 02/03/2011  . Chronic daily headache 01/16/2014  . Chronic low back pain 10/06/2012  . Chronic venous insufficiency 08/04/2012  . Clear cell renal cell carcinoma (HCC)  07/21/2011   s/p cryoablation of left RCC in 09/2011 by [name]. Followed by [name]  [facility]) .    Marland Kitchen COPD (chronic obstructive pulmonary disease) with emphysema (HCC)    PFTs 02/2012: FEV1 0.92 (40%), ratio 69, 27% increase in FEV1 with BD, TLC 91%, severe airtrapping, DLCO49% On chronic home O2. Pulmonary rehab referral 05/2012   . Depression 11/19/2005  . Esophageal stenosis 08/08/2015   Mild, benign-appearing on EGD 06/29/2015  . Fibromyalgia 08/29/2010  . Gastroesophageal  reflux disease   . History of blood transfusion    "several times"   . Hyperlipidemia LDL goal < 100 11/20/2005  . Internal hemorrhoids 08/04/2012  . Mitral stenosis    s/p Mitral valve replacement with a 27-mm pericardial porcine valve (Medtronic Mosaic valve, serial #[id number] on 09/20/10, Dr. Prescott Gum)   . Moderate to severe pulmonary hypertension    2014 TEE w PA peak pressure 46 mmHg, s/p MV replacement   . Obesity (BMI 30.0-34.9) 10/23/2011  . Obstructive sleep apnea    Nocturnal polysomnography (06/2009): Moderate sleep apnea/ hypopnea syndrome , AHI 17.8 per hour with nonpositional hypopneas. CPAP titration to 12 CWP, AHI 2.4 per hour. On nocturnal CPAP via a small resMed Quattro full-face mask with heated humidifier.   . Osteoporosis    DEXA (12/09/2011): L-spine T -3.7, left hip T -1.4 DEXA (12/2004): L-spine T -2.6, left hip -0.1   . Paroxysmal atrial fibrillation ([place]) 10/22/2010   s/p Left atrial maze procedure for paroxysmal atrial fibrillation on 09/20/2010 by Dr Prescott Gum.  Subsequent splenic infarct, decision was made to re-anticoagulate with coumadin, likely life-long as this is the most likely cause of the splenic infarct.   . Right nephrolithiasis 09/06/2014   5 mm non-obstructing calculus seen on CT scan 09/05/2014   . Seborrheic keratosis 09/28/2015  . Shortness of breath dyspnea   . Tobacco abuse 07/28/2012  . Type 2 diabetes mellitus with diabetic neuropathy (HCC)      CURRENT MEDICATIONS: Reviewed  Patient's Medications  New Prescriptions   No medications on file  Previous Medications   ALBUTEROL (PROVENTIL HFA;VENTOLIN HFA) 108 (90 BASE) MCG/ACT INHALER    Inhale 2 puffs into the lungs every 6 (six) hours as needed for wheezing or shortness of breath.   ALPRAZOLAM (XANAX) 1 MG TABLET    Take 1 mg by mouth 2 (two) times daily as needed for anxiety.   ASPIRIN 81 MG CHEWABLE TABLET    Chew 1 tablet (81 mg total) by mouth daily.   BLOOD GLUCOSE MONITORING SUPPL  (ONETOUCH VERIO) W/DEVICE KIT    1 each by Does not apply route 4 (four) times daily.   CHLORPHENIRAMINE (CHLOR-TRIMETON) 4 MG TABLET    Take 1 tablet (4 mg total) by mouth 3 (three) times daily as needed for allergies.   CYCLOBENZAPRINE (FLEXERIL) 5 MG TABLET    Take 1 tablet (5 mg total) by mouth 3 (three) times daily as needed for muscle spasms.   EASY TOUCH PEN NEEDLES 31G X 5 MM MISC    USE TO INJECT INSULIN 5 TIMES DAILY   FERROUS SULFATE 325 (65 FE) MG TABLET    Take 325 mg by mouth 2 (two) times daily with a meal.   FLUOXETINE (PROZAC) 40 MG CAPSULE    Take 40 mg by mouth daily.   FLUTICASONE (FLONASE) 50 MCG/ACT NASAL SPRAY    Place 2 sprays into both nostrils daily.   FLUTICASONE-SALMETEROL (ADVAIR DISKUS) 500-50 MCG/DOSE AEPB  Inhale 1 puff into the lungs 2 (two) times daily.   FUROSEMIDE (LASIX) 80 MG TABLET    Take 1 tablet (80 mg total) by mouth daily.   GABAPENTIN (NEURONTIN) 300 MG CAPSULE    Take 2 capsules (600 mg total) by mouth 3 (three) times daily.   GUAIFENESIN (MUCINEX) 600 MG 12 HR TABLET    Take 600 mg by mouth 2 (two) times daily.   HYDROCODONE-ACETAMINOPHEN (NORCO/VICODIN) 5-325 MG TABLET    Take 1-2 tablets by mouth every 6 (six) hours as needed for severe pain.   INSULIN DETEMIR (LEVEMIR) 100 UNIT/ML INJECTION    Inject 40 Units into the skin at bedtime.   INSULIN DISPOSABLE PUMP (V-GO 40) KIT    FILL AND PLACE VGO40 ABOUT THE SAME TIME EACH DAY. USE 4 TO 5 CLICKS WITH MEALS AND 2 CLICKS WITH SNACKS   INSULIN LISPRO (HUMALOG) 100 UNIT/ML INJECTION    Inject 0-5 Units into the skin 4 (four) times daily -  before meals and at bedtime. 7-120 = 0 units, 121-150 = 0 units, 151-200 = 0 units, 201-250 = 2 units, 251-300 = 3 units, 301-350 = 4 units, 351-400 = 5 units >400 CALL MD   INSULIN SYRINGE-NEEDLE U-100 31G X 15/64" 0.5 ML MISC    Use to inject insulin up to 4 times a day   LANCETS MISC. (ACCU-CHEK FASTCLIX LANCET) KIT    Check your blood 4 times a day dx code 250.00  insulin requiring   METOPROLOL SUCCINATE (TOPROL-XL) 25 MG 24 HR TABLET    Take 1 tablet (25 mg total) by mouth daily.   NICOTINE (NICODERM CQ - DOSED IN MG/24 HOURS) 21 MG/24HR PATCH    Place 1 patch (21 mg total) onto the skin daily.   NOVOLOG 100 UNIT/ML INJECTION    USE TO FILL VGO40 TO WEAR DAILY.   NYSTATIN-TRIAMCINOLONE (MYCOLOG II) CREAM    Apply 1 application topically 2 (two) times daily. Apply to abdominal folds and right breast BID x3 weeks   OMEPRAZOLE (PRILOSEC) 40 MG CAPSULE    Take 1 capsule (40 mg total) by mouth 2 (two) times daily.   ONETOUCH VERIO TEST STRIP    Check blood sugar up to 4 times a day   OXYGEN    Inhale 2 L into the lungs as needed (for shortness of breath while active).   POTASSIUM CHLORIDE SA (K-DUR,KLOR-CON) 20 MEQ TABLET    TAKE 1 TABLET BY MOUTH TWICE DAILY   PRESCRIPTION MEDICATION    Inhale into the lungs See admin instructions. CPAP - use whenever sleeping   PROMETHAZINE (PHENERGAN) 12.5 MG TABLET    Take 1 tablet (12.5 mg total) by mouth every 8 (eight) hours as needed for nausea or vomiting.   ROSUVASTATIN (CRESTOR) 20 MG TABLET    TAKE 1 TABLET BY MOUTH EVERY NIGHT AT BEDTIME   SPIRIVA HANDIHALER 18 MCG INHALATION CAPSULE    INHALE THE CONTENTS OF 1 CAPSULE VIA HANDIHALER BY MOUTH EVERY DAY  Modified Medications   No medications on file  Discontinued Medications   No medications on file     Allergies  Allergen Reactions  . Morphine And Related Other (See Comments)    Injection site reaction  . Oxycontin [Oxycodone] Other (See Comments)    headache  . Tramadol Hcl Swelling    Ankle swelling  . Lorazepam Other (See Comments)    Patient's sister noted that ativan caused the patient to become extremely confused during hospitalization 09/2010; tolerates  Xanax     REVIEW OF SYSTEMS:  GENERAL: no change in appetite, no fatigue, no weight changes, no fever, chills or weakness EYES: Denies change in vision, dry eyes, eye pain, itching or  discharge EARS: Denies change in hearing, ringing in ears, or earache NOSE: Denies nasal congestion or epistaxis MOUTH and THROAT: Denies oral discomfort, gingival pain or bleeding, pain from teeth or hoarseness   RESPIRATORY: no cough, SOB, DOE, wheezing, hemoptysis CARDIAC: no chest pain, edema or palpitations GI: no abdominal pain, diarrhea, constipation, heart burn, nausea or vomiting GU: Denies dysuria, frequency, hematuria, incontinence, or discharge PSYCHIATRIC: Denies feeling of depression or anxiety. No report of hallucinations, insomnia, paranoia, or agitation    PHYSICAL EXAMINATION  GENERAL APPEARANCE: Well nourished. In no acute distress. Obese SKIN:  Erythematous rashes under right breast, abdominal folds and perineum HEAD: Normal in size and contour. No evidence of trauma EYES: Lids open and close normally. No blepharitis, entropion or ectropion. PERRL. Conjunctivae are clear and sclerae are white. Lenses are without opacity EARS: Pinnae are normal. Patient hears normal voice tunes of the examiner MOUTH and THROAT: Lips are without lesions. Oral mucosa is moist and without lesions. Tongue is normal in shape, size, and color and without lesions NECK: supple, trachea midline, no neck masses, no thyroid tenderness, no thyromegaly LYMPHATICS: no LAN in the neck, no supraclavicular LAN RESPIRATORY: breathing is even & unlabored, BS CTAB CARDIAC: RRR, no murmur,no extra heart sounds, no edema GI: abdomen soft, normal BS, no masses, no tenderness, no hepatomegaly, no splenomegaly EXTREMITIES:  Able to move X 4 extremities  PSYCHIATRIC: Alert and oriented X 3. Affect and behavior are appropriate   LABS/RADIOLOGY: Labs reviewed: Basic Metabolic Panel:  Recent Labs  04/23/16 0505 04/24/16 0505 04/25/16 0932 04/29/16  NA 135 132* 133* 138  K 3.8 4.2 3.8 4.8  CL 100* 96* 96*  --   CO2 _0 --   GLUCOSE 230* 225* 309*  --   BUN 21* 26* 25* 30*  CREATININE 1.04*  1.18* 1.10* 1.2*  CALCIUM 9.3 9.7 9.1  --    Liver Function Tests:  Recent Labs  06/28/15 0241 03/27/16 1645  AST 79* 17  ALT 72* 12*  ALKPHOS 84 41  BILITOT 1.5* 0.3  PROT 5.6* 6.2*  ALBUMIN 2.7* 3.3*    Recent Labs  03/27/16 1645  LIPASE 11   CBC:  Recent Labs  06/27/15 1139  09/28/15 1156  04/22/16 0427 04/23/16 0505 04/24/16 0505 04/29/16  WBC 9.0  < > 6.0  < > 7.0 6.8 7.5 6.5  NEUTROABS 6.9  --  3.7  --   --   --   --  4  HGB 3.6*  < >  --   < > 8.7* 9.4* 9.9* 10.2*  HCT 13.5*  < > 31.2*  < > 29.2* 30.8* 32.1* 33*  MCV 68.2*  < > 85  < > 93.6 91.9 90.9  --   PLT 329  < > 203  < > 169 166 177 184  < > = values in this interval not displayed. Lipid Panel:  Recent Labs  04/19/16 1039  HDL 28*   Cardiac Enzymes:  Recent Labs  04/19/16 0428 04/19/16 1039 04/20/16 0912  TROPONINI 2.90* 4.98* 2.35*   CBG:  Recent Labs  04/25/16 0747 04/25/16 1132 04/25/16 1624  GLUCAP 302* 237* 244*      Dg Chest 2 View  Result Date: 04/22/2016 CLINICAL DATA:  Chest pain and  dry cough EXAM: CHEST  2 VIEW COMPARISON:  04/18/2016 FINDINGS: Cardiac shadow is within normal limits. Postsurgical changes are again seen. Aortic calcifications are again noted. Multiple right rib fractures are again seen with healing. Chronic blunting of left costophrenic angle is noted. No focal infiltrate or sizable effusion is seen. IMPRESSION: No acute abnormality noted. Electronically Signed   By: Inez Catalina M.D.   On: 04/22/2016 17:49   Dg Chest 2 View  Result Date: 04/18/2016 CLINICAL DATA:  Mid chest pain x2 hours for chest swelling. EXAM: CHEST  2 VIEW COMPARISON:  06/27/2015 FINDINGS: Stable cardiomegaly. The aorta is atherosclerotic without aneurysm. Mild interstitial edema without pneumonic consolidation, effusion or pneumothorax. The patient is status post median sternotomy. Old right-sided rib fractures are noted with healing. Plate and screw fixation of the mid clavicle.  Vascular clips are seen at along the right side of the neck. IMPRESSION: Mild interstitial edema. Aortic atherosclerosis. Multiple chronic right-sided rib fractures. Plate and screw fixation of the mid right clavicle. Electronically Signed   By: Ashley Royalty M.D.   On: 04/18/2016 22:09   Ct Head Wo Contrast  Result Date: 04/18/2016 CLINICAL DATA:  Headache and left upper extremity pain for 2 weeks. EXAM: CT HEAD WITHOUT CONTRAST TECHNIQUE: Contiguous axial images were obtained from the base of the skull through the vertex without intravenous contrast. COMPARISON:  01/07/2013 FINDINGS: Brain: There is no intracranial hemorrhage, mass or evidence of acute infarction. There is no extra-axial fluid collection. Gray matter and white matter appear normal. Cerebral volume is normal for age. Brainstem and posterior fossa are unremarkable. The CSF spaces appear normal. Vascular: No hyperdense vessel or unexpected calcification. Skull: Normal. Negative for fracture or focal lesion. Sinuses/Orbits: No acute finding. Other: None. IMPRESSION: Normal brain Electronically Signed   By: Andreas Newport M.D.   On: 04/18/2016 22:11   Nm Pulmonary Perf And Vent  Result Date: 04/22/2016 CLINICAL DATA:  History of COPD, chest pain EXAM: NUCLEAR MEDICINE VENTILATION - PERFUSION LUNG SCAN TECHNIQUE: Ventilation images were obtained in multiple projections using inhaled aerosol Tc-59mDTPA. Perfusion images were obtained in multiple projections after intravenous injection of Tc-977mAA. RADIOPHARMACEUTICALS:  33 mCi Technetium-9937mPA aerosol inhalation and 4.15 mCi Technetium-80m3m IV COMPARISON:  Radiograph 04/22/2016 FINDINGS: Ventilation: Heterogeneous ventilation, consistent with obstructive airways disease. Perfusion: Mild heterogeneous perfusion, but no large wedge-shaped peripheral defects. Heterogenous perfusion is in the general distribution of the heterogeneous lung ventilation. IMPRESSION: 1. Overall low probability  study for pulmonary embolus given heterogenous perfusion. 2. Heterogenous ventilation, consistent with obstructive airways disease. Electronically Signed   By: Kim Donavan Foil.   On: 04/22/2016 20:25   Ir Radiologist Eval & Mgmt  Result Date: 04/18/2016 Please refer to notes tab for details about interventional procedure. (Op Note)   ASSESSMENT/PLAN:  Physical deconditioning - for Home health PT and OT, for therapeutic strengthening exercises; fall precautions  NSTEMI - continue metoprolol succinate ER 25 mg 1 tab by mouth Q D, aspirin 81 mg 1 tab by mouth daily and Crestor 20 mg 1 tab by mouth daily at bedtime; follow-up with Dr. DaltLoralie Champagnerdiology  Vaginal yeast infection  - was given Diflucan 150 mg by mouth for a total of 2 doses ; encourage perineal hygiene  Candida, skin infection - continue  nystatin cream to abdominal folds and under right breast twice a day for a total of 3 weeks  PAF - rate-controlled; continue metoprolol succinate ER 25 mg 1 tab by mouth Q D  COPD - no SOB; continue Advair 500-50 mcg Diskus inhale 1 puff into the lungs twice a day, Spiriva 18 g inhale 1 capsule content via HandiHaler by mouth daily  Hypokalemia - continue KCl ER 20 meq 1 tab by mouth twice a day Lab Results  Component Value Date   K 4.8 04/29/2016    Major depression - mood is stable; continue Prozac 40 mg by mouth daily  Chronic diastolic CHF - continue Lasix 80 mg 1 tab by mouth daily; check BMP  Tobacco use - continue nicotine 21 mg/24-hour 1 patch on to skin daily  Hyperlipidemia - continue Crestor 20 mg 1 tab by mouth daily at bedtime; counseled for smoke cessation  Diabetes mellitus, type II with neuropathy - continue Levemir 100 units/mL inject 40 units in to skin daily at 10 PM,  Humalog sliding scale subcutaneous TID with meals was recently adjusted and gabapentin 600 mg 1 tab by mouth TID   Anxiety - mood this is stable; continue Xanax 1 mg 1 tab by mouth twice a  day when necessary  OSA - continue CPAP at at bedtime  Hx of GI bleed - continue Prilosec 40 mg 1 capsule by mouth twice a day  Iron deficiency anemia -  decrease iron 325 mg 1 tab by mouth from  twice a day to Q D Lab Results  Component Value Date   HGB 10.2 (A) 04/29/2016        I have filled out patient's discharge paperwork and written prescriptions.  Patient will receive home health PT, OT, Nursing and CNA.  DME provided:  None  Total discharge time: Less than 30 minutes  Discharge time involved coordination of the discharge process with social worker, nursing staff and therapy department. Medical justification for home health services verified.     Monina C. Detroit - NP    [employer] [phone]

## 2016-05-09 ENCOUNTER — Ambulatory Visit: Payer: Self-pay

## 2016-05-09 ENCOUNTER — Telehealth (HOSPITAL_COMMUNITY): Payer: Self-pay | Admitting: Vascular Surgery

## 2016-05-09 ENCOUNTER — Encounter (HOSPITAL_COMMUNITY): Payer: PPO

## 2016-05-09 ENCOUNTER — Encounter (HOSPITAL_COMMUNITY): Payer: Self-pay

## 2016-05-09 ENCOUNTER — Ambulatory Visit (HOSPITAL_COMMUNITY)
Admission: RE | Admit: 2016-05-09 | Discharge: 2016-05-09 | Disposition: A | Discharge status: Home / Self Care | Payer: PPO | Source: Ambulatory Visit | Attending: Cardiology | Admitting: Cardiology

## 2016-05-09 VITALS — BP 110/54 | HR 64 | Wt 181.2 lb

## 2016-05-09 DIAGNOSIS — J449 Chronic obstructive pulmonary disease, unspecified: Secondary | ICD-10-CM | POA: Insufficient documentation

## 2016-05-09 DIAGNOSIS — Z794 Long term (current) use of insulin: Secondary | ICD-10-CM | POA: Insufficient documentation

## 2016-05-09 DIAGNOSIS — D638 Anemia in other chronic diseases classified elsewhere: Secondary | ICD-10-CM | POA: Insufficient documentation

## 2016-05-09 DIAGNOSIS — G4733 Obstructive sleep apnea (adult) (pediatric): Secondary | ICD-10-CM | POA: Insufficient documentation

## 2016-05-09 DIAGNOSIS — K922 Gastrointestinal hemorrhage, unspecified: Secondary | ICD-10-CM | POA: Insufficient documentation

## 2016-05-09 DIAGNOSIS — I272 Pulmonary hypertension, unspecified: Secondary | ICD-10-CM | POA: Insufficient documentation

## 2016-05-09 DIAGNOSIS — D509 Iron deficiency anemia, unspecified: Secondary | ICD-10-CM | POA: Diagnosis not present

## 2016-05-09 DIAGNOSIS — E119 Type 2 diabetes mellitus without complications: Secondary | ICD-10-CM | POA: Diagnosis not present

## 2016-05-09 DIAGNOSIS — E785 Hyperlipidemia, unspecified: Secondary | ICD-10-CM | POA: Insufficient documentation

## 2016-05-09 DIAGNOSIS — I480 Paroxysmal atrial fibrillation: Secondary | ICD-10-CM | POA: Insufficient documentation

## 2016-05-09 DIAGNOSIS — I050 Rheumatic mitral stenosis: Secondary | ICD-10-CM | POA: Diagnosis present

## 2016-05-09 DIAGNOSIS — I5032 Chronic diastolic (congestive) heart failure: Secondary | ICD-10-CM | POA: Diagnosis not present

## 2016-05-09 DIAGNOSIS — I2510 Atherosclerotic heart disease of native coronary artery without angina pectoris: Secondary | ICD-10-CM | POA: Diagnosis not present

## 2016-05-09 DIAGNOSIS — I509 Heart failure, unspecified: Secondary | ICD-10-CM

## 2016-05-09 DIAGNOSIS — I50810 Right heart failure, unspecified: Secondary | ICD-10-CM

## 2016-05-09 LAB — BRAIN NATRIURETIC PEPTIDE: B Natriuretic Peptide: 130.8 pg/mL — ABNORMAL HIGH (ref 0.0–100.0)

## 2016-05-09 LAB — BASIC METABOLIC PANEL
Anion gap: 10 (ref 5–15)
BUN: 26 mg/dL — ABNORMAL HIGH (ref 6–20)
CO2: 27 mmol/L (ref 22–32)
Calcium: 9.4 mg/dL (ref 8.9–10.3)
Chloride: 100 mmol/L — ABNORMAL LOW (ref 101–111)
Creatinine, Ser: 1.09 mg/dL — ABNORMAL HIGH (ref 0.44–1.00)
GFR calc Af Amer: 59 mL/min — ABNORMAL LOW (ref >60)
GFR calc non Af Amer: 51 mL/min — ABNORMAL LOW (ref >60)
Glucose, Bld: 292 mg/dL — ABNORMAL HIGH (ref 65–99)
Potassium: 3.9 mmol/L (ref 3.5–5.1)
Sodium: 137 mmol/L (ref 135–145)

## 2016-05-09 LAB — CBC
HCT: 34.5 % — ABNORMAL LOW (ref 36.0–46.0)
Hemoglobin: 10.7 g/dL — ABNORMAL LOW (ref 12.0–15.0)
MCH: 28.6 pg (ref 26.0–34.0)
MCHC: 31 g/dL (ref 30.0–36.0)
MCV: 92.2 fL (ref 78.0–100.0)
Platelets: 225 10*3/uL (ref 150–400)
RBC: 3.74 MIL/uL — ABNORMAL LOW (ref 3.87–5.11)
RDW: 17.5 % — ABNORMAL HIGH (ref 11.5–15.5)
WBC: 5.6 10*3/uL (ref 4.0–10.5)

## 2016-05-09 MED ORDER — METOPROLOL SUCCINATE ER 25 MG PO TB24: 25.0000 mg | ORAL_TABLET | Freq: Every day | ORAL | 2 refills | Status: DC

## 2016-05-09 NOTE — Telephone Encounter (Signed)
Left pt message .Marland Kitchen Pt did not checkout , left detailed message about 1 month f/u w/ [facility] that will be 06/13/16 @ 2:00 pm

## 2016-05-09 NOTE — Patient Instructions (Signed)
Routine lab work today. Will notify you of abnormal results, otherwise no news is good news!  STOP Aspirin.  RESTART Coumadin. Make appointment with office that previously managed coumadin to restart coumadin.  Follow up 1 month with Dr. Aundra Dubin.  Do the following things EVERYDAY: 1) Weigh yourself in the morning before breakfast. Write it down and keep it in a log. 2) Take your medicines as prescribed 3) Eat low salt foods-Limit salt (sodium) to 2000 mg per day.  4) Stay as active as you can everyday 5) Limit all fluids for the day to less than 2 liters

## 2016-05-10 NOTE — Progress Notes (Signed)
PCP: Dr. Eppie Gibson HF Cardiology: Dr. Aundra Dubin  66 yo with COPD, mitral stenosis s/p bioprosthetic MV replacement, paroxysmal atrial fibrillation, GI bleeding, and pulmonary hypertension/RV failure presents for cardiology followup.  She was admitted with GI bleeding, thought to be AVMs, in 2/18 and warfarin was stopped.  She was admitted in 3/18 with chest pain, dyspnea, and elevated troponin.  She had not been taking her Lasix regularly.  Coronary angiography showed nonobstructive CAD, suspect demand ischemia. RHC showed elevated right and left heart filling pressures and severe pulmonary hypertension.  V/Q scan showed no chronic PE, and PFTs showed severe obstructive lung disease.  Echo showed severe RV dilation with mildly decreased systolic function.  She was diuresed and eventually discharged to [facility]   She is going home from [facility] today.  She has been getting PT regularly. She has home oxygen but does not use regularly.  She uses her CPAP.  She is still short of breath walking short distances. No orthopnea/PND. No chest pain.  She is not smoking currently but thinks that she'll restart when she gets home.  No melena/BRBPR.   ECG (3/18, personally reviewed): NSR, 1st degree AV block, septal Qs  Labs (3/18): K 4.8, creatinine 1.2, hgb 10.2  PMH: 1. Mitral stenosis: s/p bioprosthetic mitral valve replacement.  2. COPD: Severe by 3/18 PFTs.  She is still smoking.  3. Atrial fibrillation: Paroxysmal. Has been off warfarin since GI bleed in 2/18.  4. OSA: Uses CPAP.  5. Type II diabetes. 6. Pulmonary hypertension/RV failure: Suspect primarily group 3 PH due to underlying lung disease (COPD).  - RHC (3/18) with mean RA 14, PA 77/28 mean 47, mean PCWP 23, CI 2.6, PVR 4.9.  - Echo (3/18): EF 60-65%, severe RV dilation with mildly decreased RV systolic function, PASP 76 mmHg.  Bioprosthetic MV looks ok.  - V/Q scan (3/18): No evidence for chronic PE.  - PFTs (3/18): Severe obstructive  airways disease.  7. Anemia: Fe deficiency and anemia of chronic disease.   - Suspect GI AVMs as cause of 2/18 bleed . Coumadin stopped.  8. CAD: LHC (3/18) with 30% LM, 40% proximal RCA.  9. Hyperlipidemia  SH: Lives alone in [place].  Active smoker.  No ETOH.   Family History  Problem Relation Age of Onset  . Peptic Ulcer Disease Father   . Heart attack Father 11    Died of MI at age 32  . Heart attack Brother 74    Died of MI at age 57  . Obesity Brother   . Pneumonia Mother   . Healthy Sister   . Lupus Daughter   . Obsessive Compulsive Disorder Daughter    ROS: All systems reviewed and negative except as per HPI.   Current Outpatient Prescriptions  Medication Sig Dispense Refill  . albuterol (PROVENTIL HFA;VENTOLIN HFA) 108 (90 Base) MCG/ACT inhaler Inhale 2 puffs into the lungs every 6 (six) hours as needed for wheezing or shortness of breath.    . ALPRAZolam (XANAX) 1 MG tablet Take 1 mg by mouth 2 (two) times daily as needed for anxiety.    . Blood Glucose Monitoring Suppl (ONETOUCH VERIO) W/DEVICE KIT 1 each by Does not apply route 4 (four) times daily. 1 kit 0  . chlorpheniramine (CHLOR-TRIMETON) 4 MG tablet Take 1 tablet (4 mg total) by mouth 3 (three) times daily as needed for allergies. 90 tablet 11  . cyclobenzaprine (FLEXERIL) 5 MG tablet Take 1 tablet (5 mg total) by mouth 3 (  three) times daily as needed for muscle spasms. 90 tablet 3  . EASY TOUCH PEN NEEDLES 31G X 5 MM MISC USE TO INJECT INSULIN 5 TIMES DAILY 200 each 8  . ferrous sulfate 325 (65 FE) MG tablet Take 325 mg by mouth 2 (two) times daily with a meal.    . FLUoxetine (PROZAC) 40 MG capsule Take 40 mg by mouth daily.    . fluticasone (FLONASE) 50 MCG/ACT nasal spray Place 2 sprays into both nostrils daily. 16 g 3  . Fluticasone-Salmeterol (ADVAIR DISKUS) 500-50 MCG/DOSE AEPB Inhale 1 puff into the lungs 2 (two) times daily. 180 each 3  . furosemide (LASIX) 80 MG tablet Take 1 tablet (80 mg total) by  mouth daily. 180 tablet 3  . gabapentin (NEURONTIN) 300 MG capsule Take 2 capsules (600 mg total) by mouth 3 (three) times daily. 540 capsule 3  . guaiFENesin (MUCINEX) 600 MG 12 hr tablet Take 600 mg by mouth 2 (two) times daily.    Marland Kitchen HYDROcodone-acetaminophen (NORCO/VICODIN) 5-325 MG tablet Take 1-2 tablets by mouth every 6 (six) hours as needed for severe pain. 150 tablet 0  . insulin detemir (LEVEMIR) 100 UNIT/ML injection Inject 40 Units into the skin at bedtime.    . Insulin Disposable Pump (V-GO 40) KIT FILL AND PLACE VGO40 ABOUT THE SAME TIME EACH DAY. USE 4 TO 5 CLICKS WITH MEALS AND 2 CLICKS WITH SNACKS 30 kit 5  . insulin lispro (HUMALOG) 100 UNIT/ML injection Inject 0-5 Units into the skin 4 (four) times daily -  before meals and at bedtime. 7-120 = 0 units, 121-150 = 0 units, 151-200 = 0 units, 201-250 = 2 units, 251-300 = 3 units, 301-350 = 4 units, 351-400 = 5 units >400 CALL MD    . Insulin Syringe-Needle U-100 31G X 15/64" 0.5 ML MISC Use to inject insulin up to 4 times a day 400 each 3  . Lancets Misc. (ACCU-CHEK FASTCLIX LANCET) KIT Check your blood 4 times a day dx code 250.00 insulin requiring 1 kit 2  . metoprolol succinate (TOPROL-XL) 25 MG 24 hr tablet Take 1 tablet (25 mg total) by mouth daily. 60 tablet 2  . nicotine (NICODERM CQ - DOSED IN MG/24 HOURS) 21 mg/24hr patch Place 1 patch (21 mg total) onto the skin daily. 28 patch 0  . NOVOLOG 100 UNIT/ML injection USE TO FILL VGO40 TO WEAR DAILY. 30 mL 5  . nystatin-triamcinolone (MYCOLOG II) cream Apply 1 application topically 2 (two) times daily. Apply to abdominal folds and right breast BID x3 weeks    . omeprazole (PRILOSEC) 40 MG capsule Take 1 capsule (40 mg total) by mouth 2 (two) times daily. 180 capsule 3  . ONETOUCH VERIO test strip Check blood sugar up to 4 times a day 350 each 11  . potassium chloride SA (K-DUR,KLOR-CON) 20 MEQ tablet TAKE 1 TABLET BY MOUTH TWICE DAILY 180 tablet 2  . PRESCRIPTION MEDICATION Inhale  into the lungs See admin instructions. CPAP - use whenever sleeping    . promethazine (PHENERGAN) 12.5 MG tablet Take 1 tablet (12.5 mg total) by mouth every 8 (eight) hours as needed for nausea or vomiting. 30 tablet 0  . rosuvastatin (CRESTOR) 20 MG tablet TAKE 1 TABLET BY MOUTH EVERY NIGHT AT BEDTIME 90 tablet 2  . SPIRIVA HANDIHALER 18 MCG inhalation capsule INHALE THE CONTENTS OF 1 CAPSULE VIA HANDIHALER BY MOUTH EVERY DAY 90 capsule 2  . OXYGEN Inhale 2 L into the lungs as needed (  for shortness of breath while active).     No current facility-administered medications for this encounter.    BP (!) 110/54   Pulse 64   Wt 181 lb 4 oz (82.2 kg)   SpO2 97%   BMI 32.11 kg/m  General: NAD Neck: Thick, no JVD, no thyromegaly or thyroid nodule.  Lungs: Distant breath sounds bilaterally.  CV: Nondisplaced PMI.  Heart regular S1/S2, no S3/S4, no murmur.  No peripheral edema.  No carotid bruit.  Normal pedal pulses.  Abdomen: Soft, nontender, no hepatosplenomegaly, no distention.  Skin: Intact without lesions or rashes.  Neurologic: Alert and oriented x 3.  Psych: Normal affect. Extremities: No clubbing or cyanosis.  HEENT: Normal.   Assessment/Plan: 1. Pulmonary hypertension: I suspect that this is primarily group 2 PH (pulmonary venous hypertension from LV diastolic dysfunction) and group 3 PH (severe COPD, OSA).  V/Q scan showed no evidence for chronic PE.  I do not think that she will benefit from selective pulmonary vasodilators.  I think that the key here will be good diuresis + use of oxygen and CPAP.  2. Chronic diastolic CHF with prominent RV dysfunction in the setting of severe COPD.  On exam, she does not look particularly volume overloaded today.  - Continue Lasix 80 mg daily and check BMET/BNP today.  3. Atrial fibrillation: Paroxysmal.  She is in NSR by exam today. She is not anticoagulated due to GI bleed in 2/18.  No melena or BRBPR.  She is not a good candidate for Eliquis or  Xarelto with bioprosthetic MV.   - I will try her on warfarin with lower INR goal, 1.5-2.5.  She can stop ASA when she starts warfarin.  - CBC today and again in 1 month.  4. h/o GI bleeding: Possible GI AVM.  She above about restarted warfarin at lower INR goal.  Monitor closely.  5. COPD: Severe by PFTs.  I strongly encouraged her not to start back to smoking.  6. CAD: Nonobstructive. She is on a statin.   Followup in 1 month.   Loralie Champagne 05/10/2016

## 2016-05-12 ENCOUNTER — Other Ambulatory Visit: Payer: Self-pay | Admitting: *Deleted

## 2016-05-12 ENCOUNTER — Other Ambulatory Visit: Payer: Self-pay

## 2016-05-12 NOTE — Patient Outreach (Signed)
[facility]) Care Management  05/12/2016   [name] 1950/10/31 [id number]  Subjective:  I was missing my dog, I am glad to be home. I am expecting my medication delivery this week. My doctor put me back on diuretics  Objective:  Telephone encounter.  Current Medications:  Current Outpatient Prescriptions  Medication Sig Dispense Refill  . albuterol (PROVENTIL HFA;VENTOLIN HFA) 108 (90 Base) MCG/ACT inhaler Inhale 2 puffs into the lungs every 6 (six) hours as needed for wheezing or shortness of breath.    . ALPRAZolam (XANAX) 1 MG tablet Take 1 mg by mouth 2 (two) times daily as needed for anxiety.    . Blood Glucose Monitoring Suppl (ONETOUCH VERIO) W/DEVICE KIT 1 each by Does not apply route 4 (four) times daily. 1 kit 0  . chlorpheniramine (CHLOR-TRIMETON) 4 MG tablet Take 1 tablet (4 mg total) by mouth 3 (three) times daily as needed for allergies. 90 tablet 11  . cyclobenzaprine (FLEXERIL) 5 MG tablet Take 1 tablet (5 mg total) by mouth 3 (three) times daily as needed for muscle spasms. 90 tablet 3  . EASY TOUCH PEN NEEDLES 31G X 5 MM MISC USE TO INJECT INSULIN 5 TIMES DAILY 200 each 8  . ferrous sulfate 325 (65 FE) MG tablet Take 325 mg by mouth 2 (two) times daily with a meal.    . FLUoxetine (PROZAC) 40 MG capsule Take 40 mg by mouth daily.    . fluticasone (FLONASE) 50 MCG/ACT nasal spray Place 2 sprays into both nostrils daily. 16 g 3  . Fluticasone-Salmeterol (ADVAIR DISKUS) 500-50 MCG/DOSE AEPB Inhale 1 puff into the lungs 2 (two) times daily. 180 each 3  . furosemide (LASIX) 80 MG tablet Take 1 tablet (80 mg total) by mouth daily. 180 tablet 3  . gabapentin (NEURONTIN) 300 MG capsule Take 2 capsules (600 mg total) by mouth 3 (three) times daily. 540 capsule 3  . guaiFENesin (MUCINEX) 600 MG 12 hr tablet Take 600 mg by mouth 2 (two) times daily.    Marland Kitchen HYDROcodone-acetaminophen (NORCO/VICODIN) 5-325 MG tablet Take 1-2 tablets by mouth every 6 (six)  hours as needed for severe pain. 150 tablet 0  . insulin detemir (LEVEMIR) 100 UNIT/ML injection Inject 40 Units into the skin at bedtime.    . Insulin Disposable Pump (V-GO 40) KIT FILL AND PLACE VGO40 ABOUT THE SAME TIME EACH DAY. USE 4 TO 5 CLICKS WITH MEALS AND 2 CLICKS WITH SNACKS 30 kit 5  . insulin lispro (HUMALOG) 100 UNIT/ML injection Inject 0-5 Units into the skin 4 (four) times daily -  before meals and at bedtime. 7-120 = 0 units, 121-150 = 0 units, 151-200 = 0 units, 201-250 = 2 units, 251-300 = 3 units, 301-350 = 4 units, 351-400 = 5 units >400 CALL MD    . Insulin Syringe-Needle U-100 31G X 15/64" 0.5 ML MISC Use to inject insulin up to 4 times a day 400 each 3  . Lancets Misc. (ACCU-CHEK FASTCLIX LANCET) KIT Check your blood 4 times a day dx code 250.00 insulin requiring 1 kit 2  . metoprolol succinate (TOPROL-XL) 25 MG 24 hr tablet Take 1 tablet (25 mg total) by mouth daily. 60 tablet 2  . nicotine (NICODERM CQ - DOSED IN MG/24 HOURS) 21 mg/24hr patch Place 1 patch (21 mg total) onto the skin daily. 28 patch 0  . NOVOLOG 100 UNIT/ML injection USE TO FILL VGO40 TO WEAR DAILY. 30 mL 5  . nystatin-triamcinolone (MYCOLOG II)  cream Apply 1 application topically 2 (two) times daily. Apply to abdominal folds and right breast BID x3 weeks    . omeprazole (PRILOSEC) 40 MG capsule Take 1 capsule (40 mg total) by mouth 2 (two) times daily. 180 capsule 3  . ONETOUCH VERIO test strip Check blood sugar up to 4 times a day 350 each 11  . OXYGEN Inhale 2 L into the lungs as needed (for shortness of breath while active).    . potassium chloride SA (K-DUR,KLOR-CON) 20 MEQ tablet TAKE 1 TABLET BY MOUTH TWICE DAILY 180 tablet 2  . PRESCRIPTION MEDICATION Inhale into the lungs See admin instructions. CPAP - use whenever sleeping    . promethazine (PHENERGAN) 12.5 MG tablet Take 1 tablet (12.5 mg total) by mouth every 8 (eight) hours as needed for nausea or vomiting. 30 tablet 0  . rosuvastatin (CRESTOR)  20 MG tablet TAKE 1 TABLET BY MOUTH EVERY NIGHT AT BEDTIME 90 tablet 2  . SPIRIVA HANDIHALER 18 MCG inhalation capsule INHALE THE CONTENTS OF 1 CAPSULE VIA HANDIHALER BY MOUTH EVERY DAY 90 capsule 2   No current facility-administered medications for this visit.     Functional Status:  In your present state of health, do you have any difficulty performing the following activities: 05/02/2016 04/19/2016  Hearing? N -  Vision? N -  Difficulty concentrating or making decisions? N -  Walking or climbing stairs? N -  Dressing or bathing? N -  Doing errands, shopping? N N  Preparing Food and eating ? N -  Using the Toilet? N -  In the past six months, have you accidently leaked urine? N -  Do you have problems with loss of bowel control? N -  Managing your Medications? N -  Managing your Finances? Y -  Housekeeping or managing your Housekeeping? Y -  Some recent data might be hidden    Fall/Depression Screening: PHQ 2/9 Scores 05/02/2016 04/10/2016 03/04/2016 02/18/2016 01/11/2016 11/16/2015 09/28/2015  PHQ - 2 Score 3 0 1 0 _0 PHQ- 9 Score 10 - - - - - 8   THN CM Care Plan Problem One     Most Recent Value  Care Plan Problem One  patient has multiple chronic illnesses  Role Documenting the Problem One  Care Management [place] for Problem One  Active  THN Long Term Goal (31-90 days)  In the next 31 days, patient will have no acute care admissions for heart failure  THN Long Term Goal Start Date  05/12/16  THN CM Short Term Goal #1 (0-30 days)  In the next 28 days, patient will have weighed herself 14 out of 28 times andrecorded the results  THN CM Short Term Goal #1 Start Date  05/12/16  THN CM Short Term Goal #2 (0-30 days)  Patient will be in attendance toall her scheduled medical appointments for the next 28 days  THN CM Short Term Goal #2 Start Date  05/12/16    [facility] CM Care Plan Problem Two     Most Recent Value  Care Plan Problem Two  patient has medication  non-compliance  Role Documenting the Problem Two  Care Management Coordinator  Care Plan for Problem Two  Active  Interventions for Problem Two Long Term Goal   Transition of car ecall made to assess community care coordination needs.  THN Long Term Goal (31-90) days  Patient will have self-administered her diurectics  31 out of 31 days  THN Long  Term Goal Start Date  05/12/16  THN CM Short Term Goal #1 (0-30 days)  in the next 28 days, patient will make and attend a post acute care discharge with her primary care physiican  [facility] CM Short Term Goal #1 Start Date  04/01/16     Assessment:  Patient and RNCM reviewed her previous case management goals, revisions and updates completed. Patient states she has been compliant with her medications since being discharged on Friday, March 30.  Plan:  Home visit later this month for community care coordination.

## 2016-05-12 NOTE — Patient Outreach (Signed)
[facility]) Care Management  05/12/2016  [name] 03/17/1950 [id number]   CSW received an incoming call from patient, admitting that she is back home, having been discharged from [facility] [place] where patient was residing to receive short-term rehabilitative services.  Patient was released from [facility] on Friday, March 30th and home health services were arranged for patient through [facility].  These services included a home health nurse, physical therapist and occupational therapist.  However, patient admits that she has since declined the home health services arranged for her, as she is unable to afford the co-pay's per visit.  Further, patient stated, "I've been through this before, and they never really did anything for me last time so I don't think I need them".  Patient has since called the home health agency to decline services.  No additional social work needs identified at this time. CSW will perform a case closure on patient, as all goals of treatment have been met from social work standpoint and no additional social work needs have been identified at this time.  CSW will notify patient's RNCM with [place] [facility], Erenest Rasher of CSW's plans to close patient's case.  CSW will fax an update to patient's Primary Care Physician, Dr. [name] to ensure that they are aware of CSW's involvement with patient's plan of care.  CSW will submit a case closure request to Verlon Setting, Care Management Assistant with [place] [facility], in the form of an [employer].  CSW will ensure that [name] is aware of [name]'s, RNCM with [place] [facility], continued involvement with patient's care. [name], BSW, MSW, LCSW  Licensed Education officer, environmental Health System  Mailing  [place] [address], [place], [place] [address] Physical Address-[address], [place], [place] [address] Toll Free Main # [phone] Fax # [phone] Cell # [phone]  Office # [phone] [email or web address]

## 2016-05-13 ENCOUNTER — Ambulatory Visit: Payer: Self-pay | Admitting: *Deleted

## 2016-05-15 ENCOUNTER — Telehealth: Payer: Self-pay | Admitting: Internal Medicine

## 2016-05-15 NOTE — Telephone Encounter (Signed)
Calling to confirm appt for 05/16/16 at 10:15 and 11:15 lmtcb

## 2016-05-16 ENCOUNTER — Ambulatory Visit (INDEPENDENT_AMBULATORY_CARE_PROVIDER_SITE_OTHER): Payer: PPO | Admitting: Internal Medicine

## 2016-05-16 ENCOUNTER — Encounter: Payer: Self-pay | Admitting: Dietician

## 2016-05-16 ENCOUNTER — Encounter: Payer: Self-pay | Admitting: Internal Medicine

## 2016-05-16 ENCOUNTER — Ambulatory Visit (INDEPENDENT_AMBULATORY_CARE_PROVIDER_SITE_OTHER): Payer: PPO | Admitting: Dietician

## 2016-05-16 VITALS — BP 130/41 | HR 57 | Temp 97.9°F | Wt 190.3 lb

## 2016-05-16 DIAGNOSIS — I480 Paroxysmal atrial fibrillation: Secondary | ICD-10-CM | POA: Diagnosis not present

## 2016-05-16 DIAGNOSIS — B373 Candidiasis of vulva and vagina: Secondary | ICD-10-CM

## 2016-05-16 DIAGNOSIS — Z794 Long term (current) use of insulin: Secondary | ICD-10-CM

## 2016-05-16 DIAGNOSIS — F339 Major depressive disorder, recurrent, unspecified: Secondary | ICD-10-CM

## 2016-05-16 DIAGNOSIS — K219 Gastro-esophageal reflux disease without esophagitis: Secondary | ICD-10-CM

## 2016-05-16 DIAGNOSIS — E1165 Type 2 diabetes mellitus with hyperglycemia: Secondary | ICD-10-CM | POA: Diagnosis not present

## 2016-05-16 DIAGNOSIS — I2723 Pulmonary hypertension due to lung diseases and hypoxia: Secondary | ICD-10-CM

## 2016-05-16 DIAGNOSIS — D649 Anemia, unspecified: Secondary | ICD-10-CM

## 2016-05-16 DIAGNOSIS — J449 Chronic obstructive pulmonary disease, unspecified: Secondary | ICD-10-CM

## 2016-05-16 DIAGNOSIS — Z9981 Dependence on supplemental oxygen: Secondary | ICD-10-CM

## 2016-05-16 DIAGNOSIS — I214 Non-ST elevation (NSTEMI) myocardial infarction: Secondary | ICD-10-CM | POA: Diagnosis not present

## 2016-05-16 DIAGNOSIS — Z5189 Encounter for other specified aftercare: Secondary | ICD-10-CM | POA: Diagnosis not present

## 2016-05-16 DIAGNOSIS — I5032 Chronic diastolic (congestive) heart failure: Secondary | ICD-10-CM | POA: Diagnosis not present

## 2016-05-16 DIAGNOSIS — E1142 Type 2 diabetes mellitus with diabetic polyneuropathy: Secondary | ICD-10-CM | POA: Diagnosis not present

## 2016-05-16 DIAGNOSIS — I700 Atherosclerosis of aorta: Secondary | ICD-10-CM

## 2016-05-16 DIAGNOSIS — G8929 Other chronic pain: Secondary | ICD-10-CM

## 2016-05-16 DIAGNOSIS — I2722 Pulmonary hypertension due to left heart disease: Secondary | ICD-10-CM

## 2016-05-16 DIAGNOSIS — Z713 Dietary counseling and surveillance: Secondary | ICD-10-CM

## 2016-05-16 DIAGNOSIS — Q2733 Arteriovenous malformation of digestive system vessel: Secondary | ICD-10-CM | POA: Diagnosis not present

## 2016-05-16 DIAGNOSIS — J438 Other emphysema: Secondary | ICD-10-CM

## 2016-05-16 DIAGNOSIS — Z79899 Other long term (current) drug therapy: Secondary | ICD-10-CM

## 2016-05-16 DIAGNOSIS — F329 Major depressive disorder, single episode, unspecified: Secondary | ICD-10-CM

## 2016-05-16 DIAGNOSIS — J439 Emphysema, unspecified: Secondary | ICD-10-CM

## 2016-05-16 DIAGNOSIS — E785 Hyperlipidemia, unspecified: Secondary | ICD-10-CM | POA: Diagnosis not present

## 2016-05-16 DIAGNOSIS — F17210 Nicotine dependence, cigarettes, uncomplicated: Secondary | ICD-10-CM

## 2016-05-16 DIAGNOSIS — Z9989 Dependence on other enabling machines and devices: Secondary | ICD-10-CM

## 2016-05-16 DIAGNOSIS — K5520 Angiodysplasia of colon without hemorrhage: Secondary | ICD-10-CM

## 2016-05-16 DIAGNOSIS — E1140 Type 2 diabetes mellitus with diabetic neuropathy, unspecified: Secondary | ICD-10-CM

## 2016-05-16 DIAGNOSIS — Z23 Encounter for immunization: Secondary | ICD-10-CM

## 2016-05-16 DIAGNOSIS — M545 Low back pain, unspecified: Secondary | ICD-10-CM

## 2016-05-16 DIAGNOSIS — B3731 Acute candidiasis of vulva and vagina: Secondary | ICD-10-CM

## 2016-05-16 DIAGNOSIS — G4733 Obstructive sleep apnea (adult) (pediatric): Secondary | ICD-10-CM | POA: Diagnosis not present

## 2016-05-16 DIAGNOSIS — Z720 Tobacco use: Secondary | ICD-10-CM

## 2016-05-16 MED ORDER — FLUCONAZOLE 100 MG PO TABS: 100.0000 mg | ORAL_TABLET | Freq: Once | ORAL | 3 refills | Status: DC | PRN

## 2016-05-16 MED ORDER — FLUCONAZOLE 150 MG PO TABS: 150.0000 mg | ORAL_TABLET | Freq: Once | ORAL | 3 refills | Status: DC | PRN

## 2016-05-16 MED ORDER — HYDROCODONE-ACETAMINOPHEN 5-325 MG PO TABS: 1.0000 | ORAL_TABLET | Freq: Four times a day (QID) | ORAL | 0 refills | Status: DC | PRN

## 2016-05-16 MED ORDER — INSULIN ASPART 100 UNIT/ML FLEXPEN: 18.0000 [IU] | PEN_INJECTOR | Freq: Three times a day (TID) | SUBCUTANEOUS | 11 refills | Status: DC

## 2016-05-16 MED ORDER — INSULIN DETEMIR 100 UNIT/ML FLEXPEN: PEN_INJECTOR | SUBCUTANEOUS | 11 refills | Status: DC

## 2016-05-16 NOTE — Assessment & Plan Note (Signed)
Assessment  Her chronic low back pain is stable on the as needed cyclobenzaprine 5 mg by mouth every 8 hours as needed and hydrocodone-acetaminophen 5-325 mg 1-2 tablets every 6 hours as needed for moderate to severe pain, dispense #90 per month. She has required very little cyclobenzaprine. This regimen allows her to function around the house and improves the quality of her life by managing her chronic pain.  Plan  We will continue the hydrocodone-acetaminophen and Flexeril at the current doses. She was given 3 prescriptions to get her through mid June with regards to her hydrocodone-acetaminophen. We will reassess the efficacy of this regimen in maintaining her functional status and quality of life at the follow-up visit.

## 2016-05-16 NOTE — Assessment & Plan Note (Signed)
Assessment  She has not had any overt GI bleeding since stopping the Coumadin. She also denies the typical feeling of fatigue she gets when she has a significant GI bleed. It should be noted, cardiology would like to restart the Coumadin given the risk she has of a stroke. They recognize the risk for recurrent GI bleeding and therefore are recommending a lower target INR of 1.5-2.5.  Plan  She will be referred back to Dr. Elie Confer for reinitiation of anticoagulation with Coumadin for a history of paroxysmal atrial fibrillation. Therapy will be indefinite as tolerated. The goal INR is between 1.5 and 2.5. We will reassess the stability of her AVMs and hemoglobin with the restart of the anticoagulation for her paroxysmal atrial fibrillation.

## 2016-05-16 NOTE — Assessment & Plan Note (Signed)
Assessment  Part of her pulmonary hypertension is related to hypoxemia from her chronic obstructive pulmonary disease. The importance of continuous oxygen therapy was stressed, although she is currently only using it intermittently.  Plan  I've encouraged her to use the oxygen on a continuous basis rather than intermittently in hopes of decreasing periods of hypoxemia and further exacerbating her pulmonary hypertension. We will reassess the compliance with continuous oxygen therapy at the follow-up visit.

## 2016-05-16 NOTE — Assessment & Plan Note (Signed)
Assessment  She has oxygen requiring chronic obstructive pulmonary disease and is using Advair Diskus 500-50 g 1 puff twice daily, Spiriva 1 puff daily, and albuterol 2 puffs every 6 hours as needed. She admits to not using the oxygen consistently. She also continues to smoke. We discussed how her pulmonary hypertension is partially related to her chronic obstructive pulmonary disease.  Plan  She was encouraged to use her oxygen 24 hours a day and continue the Advair Diskus, Spiriva, and albuterol as she is currently using. I asked her to contact me should she be ready to quit smoking. We will reassess the efficacy of this inhaler regimen and oxygen therapy at the follow-up visit as well as her readiness for discontinuation of smoking.

## 2016-05-16 NOTE — Patient Instructions (Signed)
It was good to see you again.  1)  Your insulin is the following:  Levemir 30 units every morning Levemir 25 units every evening Novolog 18 units before meals  2) Keep taking your other medications as prescribed.  3) We gave you a tetanus shot today.  I will see you in 3 months, sooner if necessary.

## 2016-05-16 NOTE — Assessment & Plan Note (Signed)
Assessment  Because of her high risk for stroke, cardiology would like to restart anticoagulation for her history of paroxysmal atrial fibrillation.  Plan  She was referred to Dr. Elie Confer in the anticoagulation clinic and the plan is for Coumadin indefinitely as tolerated with an INR target of 1.5-2.5 given her risk of GI bleeding from her gastrointestinal arteriovenous formations. We will reassess her tolerance of the reinstitution of anticoagulation at the follow-up visit.

## 2016-05-16 NOTE — Assessment & Plan Note (Signed)
Assessment  She remains in the pre-contemplative stage for smoking cessation. We spent at least 3 minutes talking about the importance of smoking cessation with regards to her personal health issues. She is not interested in pharmacologic aids to quit at this time.  Plan  We will reassess her readiness to quit smoking at the follow-up visit. She was reminded to call if she were to change her mind.

## 2016-05-16 NOTE — Assessment & Plan Note (Signed)
Assessment  Her mood today was improved on the fluoxetine 40 mg by mouth daily.  Plan  We will continue fluoxetine 40 mg by mouth daily and reassess the efficacy of this therapy in controlling her depressive symptoms at follow-up visit.

## 2016-05-16 NOTE — Assessment & Plan Note (Signed)
>>  ASSESSMENT AND PLAN FOR CHRONIC CHF (CONGESTIVE HEART FAILURE) (HCC) WRITTEN ON 05/16/2016  3:12 PM BY Doneen Poisson, MD  Assessment  Symptomatically her chronic congestive heart failure with left ventricular diastolic dysfunction is well controlled on her current regimen of Lasix 80 mg by mouth every morning and metoprolol XL 25 mg by mouth daily.  Plan  We will continue with the Lasix 80 mg by mouth daily and metoprolol XL 25 mg by mouth daily. The importance of managing her chronic diastolic heart failure is important given her moderately severe pulmonary hypertension that is partially related to her diastolic dysfunction.

## 2016-05-16 NOTE — Assessment & Plan Note (Signed)
Assessment  She is currently asymptomatic with regards to her vulvovaginal candidiasis.  Plan  I rewrote a prescription for fluconazole 150 mg by mouth once as needed with 2 tablets dispensed at a time and 3 refills. We will reassess the efficacy of this therapy should be required at the follow-up visit.

## 2016-05-16 NOTE — Assessment & Plan Note (Signed)
Assessment  She is tolerating the rosuvastatin at 20 mg by mouth daily without myalgias.  Plan  We will continue the rosuvastatin at 20 mg by mouth daily and reassess for intolerances at the follow-up visit.

## 2016-05-16 NOTE — Patient Instructions (Signed)
Adonica,  Thank you for the [employer] donations.   You did great keeping those records and letting us know what you need! Thank you for that also as it makes my job easier.  I will try to remember to give you a call in a few weeks, but please call me if I forget of you have questions or concerns.  Butch Penny

## 2016-05-16 NOTE — Assessment & Plan Note (Signed)
Assessment  Although a recent hemoglobin A1c within the last 2 weeks was 6.4 I believe this is artificially low secondary to a recent GI bleed requiring transfusion as well as underlying anemia with possible slow GI bleed from her AVMs. The reason why I say this is that over the last month she has had 15 glucose readings at home with an average glucose of 245. This is not consistent with a hemoglobin A1c of 6.4. Therefore, she will require some adjustments in her diabetes regimen. With regard to her neuropathy the pain is markedly improved on the gabapentin 600 mg by mouth 3 times daily and her foot exam demonstrated intact sensation to the monofilament examination.  Plan  The following adjustments have been made to her insulin regimen. She is now to take Levemir 30 units every morning and 25 units every afternoon. In addition, she will take NovoLog 18 units before meals 3 times daily. We will reassess the efficacy of this insulin adjustment at the follow-up visit with a repeat hemoglobin A1c. With regards to her peripheral neuropathy we will continue the gabapentin at 600 mg by mouth 3 times daily given her symptomatic improvement with this therapy.

## 2016-05-16 NOTE — Assessment & Plan Note (Signed)
Assessment  She has been compliant with her nocturnal CPAP therapy and denies excessive daytime somnolence.  Plan  We will continue with her nocturnal CPAP therapy and reassess the efficacy of this intervention on her symptoms at the follow-up visit. It is important that she not have nocturnal desaturations given her moderately severe pulmonary hypertension.

## 2016-05-16 NOTE — Assessment & Plan Note (Signed)
Assessment  Part of her pulmonary hypertension is related to her left ventricular diastolic dysfunction with volume overload. She remains on daily Lasix therapy.  Plan  We will continue with the Lasix at 80 mg by mouth every morning in order to control her volume and left ventricular diastolic dysfunction. We will reassess her via status at the follow-up visit.

## 2016-05-16 NOTE — Assessment & Plan Note (Signed)
Assessment  Her gastroesophageal reflux disease is symptomatically well controlled on the omeprazole at 40 mg by mouth twice daily. Unfortunately she did not tolerate regimens lower than this with regards to symptomatically control.  Plan  We will continue the omeprazole at 40 mg by mouth twice daily and reassess the efficacy of this therapy for her symptoms at the follow-up visit.

## 2016-05-16 NOTE — Assessment & Plan Note (Signed)
Assessment  Symptomatically her chronic congestive heart failure with left ventricular diastolic dysfunction is well controlled on her current regimen of Lasix 80 mg by mouth every morning and metoprolol XL 25 mg by mouth daily.  Plan  We will continue with the Lasix 80 mg by mouth daily and metoprolol XL 25 mg by mouth daily. The importance of managing her chronic diastolic heart failure is important given her moderately severe pulmonary hypertension that is partially related to her diastolic dysfunction.

## 2016-05-16 NOTE — Progress Notes (Signed)
   Subjective:    Patient ID: [name], female    DOB: November 08, 1950, 66 y.o.   MRN: [id number]  HPI  [name] is here for hospital follow-up of GI bleed felt to be secondary to small bowel AVMs and a recent hospitalization secondary to a non-ST elevation myocardial infarction secondary to demand ischemia. Please see the A&P for the status of the pt's chronic medical problems.  She has had no further blood in her stools and denies fatigue that typically accompanies her GI bleeding with significant blood loss.  She also denies any further episodes of chest pain.  Review of Systems  Constitutional: Negative for activity change, appetite change, fatigue, fever and unexpected weight change.  Respiratory: Positive for cough, shortness of breath and wheezing. Negative for chest tightness.   Cardiovascular: Negative for chest pain, palpitations and leg swelling.  Gastrointestinal: Negative for abdominal pain, blood in stool, diarrhea, nausea and vomiting.  Musculoskeletal: Positive for arthralgias and back pain. Negative for joint swelling and myalgias.  Skin: Negative for color change and wound.  Neurological: Positive for headaches.      Objective:   Physical Exam  Constitutional: She is oriented to person, place, and time. She appears well-developed and well-nourished. No distress.  HENT:  Head: Normocephalic and atraumatic.  Eyes: Conjunctivae are normal. Right eye exhibits no discharge. Left eye exhibits no discharge. No scleral icterus.  Musculoskeletal: Normal range of motion. She exhibits no edema, tenderness or deformity.  Neurological: She is alert and oriented to person, place, and time. She exhibits normal muscle tone. Coordination normal.  Skin: Skin is warm and dry. No rash noted. She is not diaphoretic. No erythema.  Psychiatric: She has a normal mood and affect. Her behavior is normal. Judgment and thought content normal.  Nursing note and vitals reviewed.       Assessment & Plan:   Please see problem oriented charting.

## 2016-05-16 NOTE — Progress Notes (Signed)
Medical Nutrition Therapy:  Appt start time: 1030 end time:  1115. Visit #1 Last visit 09/18/2015 for VGo training Assessment:  Primary concerns today: Blood sugar control Usual eating pattern includes Meals 2-3 and ~2 snacks per day.   Weight stable, intake adequate in calories.   [place] kept records of insulin intake and blood sugars with times for past 5-6 days. Her schedule is variable with both checking blood sugar and taking insulin. She is not using the Vgo anymore because she had trouble with skin irritation and had to take extra shots with it. Currently she takes Levemir and Novolog. sometimes takes novolog at night and  does not check blood sugar, reports she is often awake during the early am hours and has no signs or symptoms of low blood sugar.  Her food intake is variable as well. She has been approved for [employer] meals but is unsure when that will begin.   Blood sugars: mostly fasting average 245, 153 to 361 fasting, 2 readings in 100s are earlier in morning or she had taken her levemir  In the after noon  Has been taking : Levemir 45 units daily in am between 830 am and 230 PM Novolog- 2-3 times a day 18 to 38 units with total pe day 56 to 89 average is 68.  Actual TTD-113 units/day ,  Suggested basal 55-58 units/day, bolus- 55-56 units/day.  Patient in agreement to split basal to help cover full 24+ hours with variable schedule. Start 30 units levemir QAM and 25 units levemir QPM.   Novolog suggestion: 18 units with each meal and add correction if needed  Progress Towards Goal(s):  In progress   Nutritional Diagnosis:   NB-1.1 Food and nutrition-related knowledge deficit As related to lack of previous information about nutrient density as evidenced by her comments and our discussion today..    Interventions: 1- Educated patient about need for balanced, carb consistent meals/snacks- agree with [employer] meals 2- Reviewed meter download with patient: 3- Coordination of care-  Discussed with Dr. Eppie Gibson  Monitoring/Evaluation:  Dietary intake, blood sugars and insulin amounts in 2 week(s) [place], Butch Penny, [place] 05/16/2016 3:23 PM.

## 2016-05-16 NOTE — Assessment & Plan Note (Signed)
Assessment  She has no signs or symptoms of symptomatic atherosclerotic disease.  Plan  We will reassess for signs or symptoms of aortic atherosclerotic disease at the follow-up visit. In the meantime, we will aggressively treat her hyperlipidemia with a high intensity statin, continue to recommend smoking cessation, and aggressively treat her diabetes with the adjustments in the regimen as noted.

## 2016-05-19 ENCOUNTER — Ambulatory Visit: Payer: PPO | Admitting: Pharmacist

## 2016-05-21 ENCOUNTER — Other Ambulatory Visit: Payer: Self-pay

## 2016-05-21 NOTE — Patient Outreach (Signed)
[facility]) Care Management  [place]  05/21/2016   [name] 06-23-1950 [id number]  Subjective:  I don't feel like a visit today. I am in pain, I just want to take my pain medication and go to bed.  Objective:  Patient refused home visit today after RNCM arrived at home.  Encounter Medications:  Outpatient Encounter Prescriptions as of 05/21/2016  Medication Sig  . albuterol (PROVENTIL HFA;VENTOLIN HFA) 108 (90 Base) MCG/ACT inhaler Inhale 2 puffs into the lungs every 6 (six) hours as needed for wheezing or shortness of breath.  . Blood Glucose Monitoring Suppl (ONETOUCH VERIO) W/DEVICE KIT 1 each by Does not apply route 4 (four) times daily.  . chlorpheniramine (CHLOR-TRIMETON) 4 MG tablet Take 1 tablet (4 mg total) by mouth 3 (three) times daily as needed for allergies.  . cyclobenzaprine (FLEXERIL) 5 MG tablet Take 1 tablet (5 mg total) by mouth 3 (three) times daily as needed for muscle spasms.  Marland Kitchen EASY TOUCH PEN NEEDLES 31G X 5 MM MISC USE TO INJECT INSULIN 5 TIMES DAILY  . ferrous sulfate 325 (65 FE) MG tablet Take 325 mg by mouth 2 (two) times daily with a meal.  . fluconazole (DIFLUCAN) 150 MG tablet Take 1 tablet (150 mg total) by mouth once as needed.  Marland Kitchen FLUoxetine (PROZAC) 40 MG capsule Take 40 mg by mouth daily.  . fluticasone (FLONASE) 50 MCG/ACT nasal spray Place 2 sprays into both nostrils daily.  . Fluticasone-Salmeterol (ADVAIR DISKUS) 500-50 MCG/DOSE AEPB Inhale 1 puff into the lungs 2 (two) times daily.  . furosemide (LASIX) 80 MG tablet Take 1 tablet (80 mg total) by mouth daily.  Marland Kitchen gabapentin (NEURONTIN) 300 MG capsule Take 2 capsules (600 mg total) by mouth 3 (three) times daily.  Marland Kitchen HYDROcodone-acetaminophen (NORCO/VICODIN) 5-325 MG tablet Take 1-2 tablets by mouth every 6 (six) hours as needed for severe pain.  Marland Kitchen insulin aspart (NOVOLOG) 100 UNIT/ML FlexPen Inject 18 Units into the skin 3 (three) times daily with meals.  .  Insulin Detemir (LEVEMIR) 100 UNIT/ML Pen USE 30 UNITS IN THE MORNING AND 25 UNITS IN THE EVENING  . Insulin Syringe-Needle U-100 31G X 15/64" 0.5 ML MISC Use to inject insulin up to 4 times a day  . Lancets Misc. (ACCU-CHEK FASTCLIX LANCET) KIT Check your blood 4 times a day dx code 250.00 insulin requiring  . metoprolol succinate (TOPROL-XL) 25 MG 24 hr tablet Take 1 tablet (25 mg total) by mouth daily.  Marland Kitchen omeprazole (PRILOSEC) 40 MG capsule Take 1 capsule (40 mg total) by mouth 2 (two) times daily.  Glory Rosebush VERIO test strip Check blood sugar up to 4 times a day  . OXYGEN Inhale 2 L into the lungs continuous.  . potassium chloride SA (K-DUR,KLOR-CON) 20 MEQ tablet TAKE 1 TABLET BY MOUTH TWICE DAILY  . promethazine (PHENERGAN) 12.5 MG tablet Take 1 tablet (12.5 mg total) by mouth every 8 (eight) hours as needed for nausea or vomiting.  . rosuvastatin (CRESTOR) 20 MG tablet TAKE 1 TABLET BY MOUTH EVERY NIGHT AT BEDTIME  . SPIRIVA HANDIHALER 18 MCG inhalation capsule INHALE THE CONTENTS OF 1 CAPSULE VIA HANDIHALER BY MOUTH EVERY DAY   No facility-administered encounter medications on file as of 05/21/2016.     Functional Status:  In your present state of health, do you have any difficulty performing the following activities: 05/16/2016 05/12/2016  Hearing? N N  Vision? Y Y  Difficulty concentrating or making decisions? N N  Walking or climbing stairs? Y Y  Dressing or bathing? N N  Doing errands, shopping? [name]  Preparing Food and eating ? - Y  Using the Toilet? - N  In the past six months, have you accidently leaked urine? - Y  Do you have problems with loss of bowel control? - N  Managing your Medications? - Y  Managing your Finances? - N  Housekeeping or managing your Housekeeping? - Y  Some recent data might be hidden    Fall/Depression Screening: PHQ 2/9 Scores 05/16/2016 05/12/2016 05/02/2016 04/10/2016 03/04/2016 02/18/2016 01/11/2016  PHQ - 2 Score _0 0 1 0 1  PHQ- 9 Score 13 - 10 - -  - -    Assessment:  RNCM arrived at patient's home for this scheduled home visit. RNCM called patient to get patient to come to the door, as she had previously requested. Patient requested a call next week to reschedule home visit.  Plan:  Telephone call in the next 21 days to reschedule home visit.

## 2016-05-22 ENCOUNTER — Other Ambulatory Visit (HOSPITAL_COMMUNITY): Payer: Self-pay | Admitting: Cardiology

## 2016-05-22 DIAGNOSIS — E1140 Type 2 diabetes mellitus with diabetic neuropathy, unspecified: Secondary | ICD-10-CM

## 2016-05-22 DIAGNOSIS — I5032 Chronic diastolic (congestive) heart failure: Secondary | ICD-10-CM

## 2016-05-22 DIAGNOSIS — Z794 Long term (current) use of insulin: Secondary | ICD-10-CM

## 2016-05-22 MED ORDER — FUROSEMIDE 80 MG PO TABS: 80.0000 mg | ORAL_TABLET | Freq: Every day | ORAL | 3 refills | Status: DC

## 2016-05-22 MED ORDER — METOPROLOL SUCCINATE ER 25 MG PO TB24: 25.0000 mg | ORAL_TABLET | Freq: Every day | ORAL | 3 refills | Status: DC

## 2016-05-23 ENCOUNTER — Ambulatory Visit: Payer: PPO

## 2016-05-26 ENCOUNTER — Ambulatory Visit (INDEPENDENT_AMBULATORY_CARE_PROVIDER_SITE_OTHER): Payer: PPO | Admitting: Internal Medicine

## 2016-05-26 ENCOUNTER — Ambulatory Visit (INDEPENDENT_AMBULATORY_CARE_PROVIDER_SITE_OTHER): Payer: PPO | Admitting: Pharmacist

## 2016-05-26 VITALS — BP 126/38 | HR 58 | Temp 98.0°F | Ht 62.0 in | Wt 192.9 lb

## 2016-05-26 DIAGNOSIS — Z7901 Long term (current) use of anticoagulants: Secondary | ICD-10-CM

## 2016-05-26 DIAGNOSIS — I480 Paroxysmal atrial fibrillation: Secondary | ICD-10-CM

## 2016-05-26 DIAGNOSIS — N3941 Urge incontinence: Secondary | ICD-10-CM | POA: Diagnosis not present

## 2016-05-26 DIAGNOSIS — R1030 Lower abdominal pain, unspecified: Secondary | ICD-10-CM

## 2016-05-26 DIAGNOSIS — N3946 Mixed incontinence: Secondary | ICD-10-CM

## 2016-05-26 DIAGNOSIS — R3911 Hesitancy of micturition: Secondary | ICD-10-CM

## 2016-05-26 LAB — POCT URINALYSIS DIPSTICK
Bilirubin, UA: NEGATIVE
Blood, UA: NEGATIVE
Glucose, UA: NEGATIVE
Ketones, UA: NEGATIVE
Nitrite, UA: NEGATIVE
Protein, UA: NEGATIVE
Spec Grav, UA: 1.025 (ref 1.010–1.025)
Urobilinogen, UA: 1 E.U./dL
pH, UA: 6 (ref 5.0–8.0)

## 2016-05-26 LAB — POCT INR: INR: 1

## 2016-05-26 LAB — GLUCOSE, CAPILLARY: Glucose-Capillary: 229 mg/dL — ABNORMAL HIGH (ref 65–99)

## 2016-05-26 NOTE — Patient Instructions (Signed)
Patient instructed to take medications as defined in the Anti-coagulation Track section of this encounter.  Patient instructed to take today's dose.  Patient instructed to take 1/2 tablet of her peach-colored 74m warfarin tablets by mouth, once-daily at 6PM.  Patient instructed--in the event she must use her "as needed" fluconozole/Diflucan for vaginal yeast infection--she MUST CALL ME IN ADVANCE of taking this medication due to the drug-drug interaction potential with warfarin. Patient verbalized understanding of these instructions.

## 2016-05-26 NOTE — Progress Notes (Signed)
Anti-Coagulation Progress Note  [name] is a 66 y.o. female who is currently on an anti-coagulation regimen.    RECENT RESULTS: Recent results are below, the most recent result is correlated with a dose of ZERO mg. per week: She is recommencing warfarin. Baseline INR today 1.0 Lab Results  Component Value Date   INR 1.0 05/26/2016   INR 1.07 04/21/2016   INR 2.26 03/29/2016    ANTI-COAG DOSE: Anticoagulation Dose Instructions as of 05/26/2016      [name] Tue Wed Thu Fri Sat   New Dose 2.5 mg 2.5 mg 2.5 mg 2.5 mg 2.5 mg 2.5 mg 2.5 mg    Description   Will recommence warfarin with a regimen that has previously given the targeted INR. Will re-evaluate in 1 week. Given the lower targeted INR of 1.5 - 2.5 we will have an opportunity to intervene to LOWER the dose PRN next Monday 23-APR-18 as necessary.       ANTICOAG SUMMARY: Anticoagulation Episode Summary    Current INR goal:   1.5-2.5  TTR:   79.9 % (3.2 y)  Next INR check:   06/02/2016  INR from last check:   1.0! (05/26/2016)  Goal at result date:   2.0-3.0  Weekly max dose:     Target end date:     [facility] check [facility]   [facility]  Preferred lab:     Send INR reminders to:   ANTICOAG [facility] [place]   Indications   Paroxysmal atrial fibrillation ([place]) [I48.0] Long term current use of anticoagulant therapy [Z79.01]       Comments:         Anticoagulation Care Providers    Provider Role Specialty Phone number   Lelon Perla, MD  Cardiology [phone]      ANTICOAG TODAY: Anticoagulation Summary  As of 05/26/2016   INR goal:   1.5-2.5  TTR:     Prior goal:   2.0-3.0  Today's INR:   1.0!  Next INR check:   06/02/2016  Target end date:      Indications   Paroxysmal atrial fibrillation ([place]) [I48.0] Long term current use of anticoagulant therapy [Z79.01]        Anticoagulation Episode Summary    [facility] check location[facility]   [facility]   Preferred lab:      Send INR reminders to:    ANTICO[facility] [address]   Comments:       Anticoagulation Care Providers    Provider Role Specialty Phone number   Lelon Perla, MD  Cardiology [phone]      PATIENT INSTRUCTIONS: Patient Instructions  Patient instructed to take medications as defined in the Anti-coagulation Track section of this encounter.  Patient instructed to take today's dose.  Patient instructed to take 1/2 tablet of her peach-colored 33m warfarin tablets by mouth, once-daily at 6PM.  Patient instructed--in the event she must use her "as needed" fluconozole/Diflucan for vaginal yeast infection--she MUST CALL ME IN ADVANCE of taking this medication due to the drug-drug interaction potential with warfarin. Patient verbalized understanding of these instructions.       FOLLOW-UP Return in 7 days (on 06/02/2016) for Follow up INR at 2:15PM.  JJorene Guest III Pharm.D., CACP

## 2016-05-26 NOTE — Progress Notes (Signed)
CC: "Can't get to the bathroom fast enough"  HPI:  [name] is a 66 y.o. woman with PMHx as noted below who presents today for evaluation of urinary urgency.  She reports about 2 weeks ago she began having the urge to urinate frequently and sometimes cannot get to the bathroom on time. She wears adult pull-ups and admits having a few "accidents" when she has not been able to make it to the toilet fast enough. She denies any relation to her Lasix dosing as she can go all day without taking her Lasix and still get the frequent urge to urinate. She notes sometimes when she is trying to urinate that she has trouble relaxing and it will take 2-3 minutes before she is able to urinate. She reports dysuria occasionally, but it is not every time. She denies hematuria. She feels pressure on her lower abdomen and this causes her to feel the urge to urinate. She denies taking any anticholingeric medications. She reports having urinary leakage when she coughs, sneezes, or gets up to walk to use the restroom.   Past Medical History:  Diagnosis Date  . Adenomatous polyps 05/14/2011   Colonoscopy (05/2011): 4 mm adenomatous polyp excised endoscopically Colonoscopy (02/2002): Adenomatous polyp excised endoscopically   . Allergic rhinitis 06/01/2012  . Anemia of chronic disease 01/01/2013  . Anxiety 07/24/2010  . Aortic atherosclerosis ([place]) 10/19/2014   Seen on CT scan, currently asymptomatic  . 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 Asymptomatic cholelithiasis 09/25/2015   Seen on CT scan 08/2015  . Bilateral cataracts 08/04/2012   Visually insignificant   . Carotid artery stenosis    s/p right endarterectomy (06/2010) Carotid US (07/2010):  Left: Moderate-to-severe (60-79%) calcific and non-calcific plaque origin and proximal ICA and ECA   . Chronic congestive heart failure with left ventricular diastolic  dysfunction ([place]) 10/21/2010  . Chronic constipation 02/03/2011  . Chronic daily headache 01/16/2014  . Chronic low back pain 10/06/2012  . Chronic venous insufficiency 08/04/2012  . Clear cell renal cell carcinoma ([place]) 07/21/2011   s/p cryoablation of left RCC in 09/2011 by [name]. Followed by [name]  [facility]) .    Marland Kitchen COPD (chronic obstructive pulmonary disease) with emphysema (HCC)    PFTs 02/2012: FEV1 0.92 (40%), ratio 69, 27% increase in FEV1 with BD, TLC 91%, severe airtrapping, DLCO49% On chronic home O2. Pulmonary rehab referral 05/2012   . Depression 11/19/2005  . Esophageal stenosis 08/08/2015   Mild, benign-appearing on EGD 06/29/2015  . Fibromyalgia 08/29/2010  . Gastroesophageal reflux disease   . History of blood transfusion    "several times"   . Hyperlipidemia LDL goal < 100 11/20/2005  . Internal hemorrhoids 08/04/2012  . Mitral stenosis    s/p Mitral valve replacement with a 27-mm pericardial porcine valve (Medtronic Mosaic valve, serial #[id number] on 09/20/10, Dr. Prescott Gum)   . Moderate to severe pulmonary hypertension ([place])    2014 TEE w PA peak pressure 46 mmHg, s/p MV replacement   . Obesity (BMI 30.0-34.9) 10/23/2011  . Obstructive sleep apnea    Nocturnal polysomnography (06/2009): Moderate sleep apnea/ hypopnea syndrome , AHI 17.8 per hour with nonpositional hypopneas. CPAP titration to 12 CWP, AHI 2.4 per hour. On nocturnal CPAP via a small resMed Quattro full-face mask with heated humidifier.   . Osteoporosis    DEXA (12/09/2011): L-spine T -3.7, left hip T -1.4 DEXA (12/2004): L-spine T -  2.6, left hip -0.1   . Paroxysmal atrial fibrillation ([place]) 10/22/2010   s/p Left atrial maze procedure for paroxysmal atrial fibrillation on 09/20/2010 by Dr Prescott Gum.  Subsequent splenic infarct, decision was made to re-anticoagulate with coumadin, likely life-long as this is the most likely cause of the splenic infarct.   . Pulmonary hypertension due to chronic  obstructive pulmonary disease ([place]) 04/25/2016  . Right nephrolithiasis 09/06/2014   5 mm non-obstructing calculus seen on CT scan 09/05/2014   . Seborrheic keratosis 09/28/2015  . Shortness of breath dyspnea   . Tobacco abuse 07/28/2012  . Type 2 diabetes mellitus with diabetic neuropathy (HCC)     Review of Systems:   General: Denies fever, chills, night sweats, changes in weight, changes in appetite HEENT: Denies headaches, ear pain, changes in vision, rhinorrhea, sore throat CV: Denies CP, palpitations, SOB, orthopnea Pulm: Denies SOB, cough, wheezing GI: Denies abdominal pain, nausea, vomiting, diarrhea, constipation, melena, hematochezia GU: See HPI Msk: Denies muscle cramps, joint pains Neuro: Denies weakness, numbness, tingling Skin: Denies rashes, bruising Psych: Denies depression, anxiety, hallucinations  Physical Exam:  Vitals:   05/26/16 1432  BP: (!) 126/38  Pulse: (!) 58  Temp: 98 F (36.7 C)  TempSrc: Oral  SpO2: 97%  Weight: 192 lb 14.4 oz (87.5 kg)  Height: _0  (1.575 m)   General:  Abd: BS+, soft, non-tender, non-distended. No suprapubic tenderness.  Assessment & Plan:   See Encounters Tab for problem based charting.  Patient discussed with Dr. Lynnae January

## 2016-05-26 NOTE — Patient Instructions (Signed)
General Instructions: - Your urinary issue sounds as if you have urge incontinence  - Recommend to do Kegel exercises three times daily for 3-4 days per week - Continue wearing pull-ups as needed  Please bring your medicines with you each time you come to clinic.  Medicines may include prescription medications, over-the-counter medications, herbal remedies, eye drops, vitamins, or other pills.   Progress Toward Treatment Goals:  Treatment Goal 08/08/2015  Hemoglobin A1C at goal  Blood pressure -  Stop smoking smoking the same amount  Prevent falls unchanged    Self Care Goals & Plans:  Self Care Goal 08/08/2015  Manage my medications take my medicines as prescribed; bring my medications to every visit  Monitor my health bring my glucose meter and log to each visit; keep track of my blood glucose  Eat healthy foods eat foods that are low in salt; eat baked foods instead of fried foods; drink diet soda or water instead of juice or soda  Be physically active find an activity I enjoy  Stop smoking cut down the number of cigarettes smoked  Prevent falls use home fall prevention checklist to improve safety; have my vision checked  Meeting treatment goals -    Home Blood Glucose Monitoring 08/08/2015  Check my blood sugar 3 times a day  When to check my blood sugar before meals     Care Management & Community Referrals:  Referral 08/08/2015  Referrals made for care management support none needed  Referrals made to community resources none

## 2016-05-27 ENCOUNTER — Encounter: Payer: Self-pay | Admitting: Internal Medicine

## 2016-05-27 DIAGNOSIS — N3946 Mixed incontinence: Secondary | ICD-10-CM | POA: Insufficient documentation

## 2016-05-27 NOTE — Assessment & Plan Note (Addendum)
Patient's symptoms of urinary urgency in combination with urinary incontinence with sneezing, coughing, and physical activity seems most consistent with mixed incontinence. A post void residual was checked as she had described lower abdominal pressure and urinary hesitancy and this was normal at 34 cc. A urine dipstick was also checked and this was negative for infection. Her medication list was reviewed and no anticholinergic medications or medications that could cause urinary retention were identified. Recommended for her to start doing Kegel exercises 3-4 times weekly. She was given a hand out on these exercises which we reviewed together. She was agreeable to this plan.

## 2016-05-28 ENCOUNTER — Other Ambulatory Visit: Payer: Self-pay | Admitting: Pharmacist

## 2016-05-28 ENCOUNTER — Encounter: Payer: Self-pay | Admitting: Pharmacist

## 2016-05-28 NOTE — Patient Outreach (Signed)
[facility]) Care Management  [place]   05/28/2016  [name] 08-22-1950 [id number]  Subjective: Patient was called to follow up after her recent hospitalization and subsequent skilled nursing facility stay.  HIPAA identifiers were obtained. Patient has multiple medical conditions including but not limited to:  Atrial fibrillation, heart failure with preserved ejection fraction, carotid artery stenosis status post endarterectomy, diabetes, hypertension, hyperlipidemia, COPD, osteoporosis, depression/anxiety, history of renal cell carcinoma, fibromyalgia, bilateral cataracts, and chronic low back pain.  She was hospitalized 03/2016 for iron deficiency anemia due to GI bleed. She was hospitalized 04/18/16 for NSTEMI.  Post hospital/SNF pharmacy visit was offered to the patient and she declined stating that she had a good handle on her medications after our visit and our post hospital follow up visits with her providers.    Objective:   Encounter Medications: Outpatient Encounter Prescriptions as of 05/28/2016  Medication Sig Note  . albuterol (PROVENTIL HFA;VENTOLIN HFA) 108 (90 Base) MCG/ACT inhaler Inhale 2 puffs into the lungs every 6 (six) hours as needed for wheezing or shortness of breath.   . Blood Glucose Monitoring Suppl (ONETOUCH VERIO) W/DEVICE KIT 1 each by Does not apply route 4 (four) times daily.   . chlorpheniramine (CHLOR-TRIMETON) 4 MG tablet Take 1 tablet (4 mg total) by mouth 3 (three) times daily as needed for allergies.   . cyclobenzaprine (FLEXERIL) 5 MG tablet Take 1 tablet (5 mg total) by mouth 3 (three) times daily as needed for muscle spasms.   Marland Kitchen EASY TOUCH PEN NEEDLES 31G X 5 MM MISC USE TO INJECT INSULIN 5 TIMES DAILY   . ferrous sulfate 325 (65 FE) MG tablet Take 325 mg by mouth 2 (two) times daily with a meal.   . FLUoxetine (PROZAC) 20 MG tablet Take 40 mg by mouth daily.   . fluticasone (FLONASE) 50 MCG/ACT nasal spray Place 2  sprays into both nostrils daily.   . Fluticasone-Salmeterol (ADVAIR DISKUS) 500-50 MCG/DOSE AEPB Inhale 1 puff into the lungs 2 (two) times daily.   . furosemide (LASIX) 80 MG tablet Take 1 tablet (80 mg total) by mouth daily.   Marland Kitchen gabapentin (NEURONTIN) 300 MG capsule Take 2 capsules (600 mg total) by mouth 3 (three) times daily.   Marland Kitchen HYDROcodone-acetaminophen (NORCO/VICODIN) 5-325 MG tablet Take 1-2 tablets by mouth every 6 (six) hours as needed for severe pain.   Marland Kitchen insulin aspart (NOVOLOG) 100 UNIT/ML FlexPen Inject 18 Units into the skin 3 (three) times daily with meals.   . Insulin Detemir (LEVEMIR) 100 UNIT/ML Pen USE 30 UNITS IN THE MORNING AND 25 UNITS IN THE EVENING   . Insulin Syringe-Needle U-100 31G X 15/64" 0.5 ML MISC Use to inject insulin up to 4 times a day   . Lancets Misc. (ACCU-CHEK FASTCLIX LANCET) KIT Check your blood 4 times a day dx code 250.00 insulin requiring   . metoprolol succinate (TOPROL-XL) 25 MG 24 hr tablet Take 1 tablet (25 mg total) by mouth daily.   Marland Kitchen omeprazole (PRILOSEC) 40 MG capsule Take 1 capsule (40 mg total) by mouth 2 (two) times daily.   Glory Rosebush VERIO test strip Check blood sugar up to 4 times a day   . OXYGEN Inhale 2 L into the lungs continuous.   . potassium chloride SA (K-DUR,KLOR-CON) 20 MEQ tablet TAKE 1 TABLET BY MOUTH TWICE DAILY 05/28/2016: Patient reported taking 1 tablet daily  . promethazine (PHENERGAN) 12.5 MG tablet Take 1 tablet (12.5 mg total) by mouth  every 8 (eight) hours as needed for nausea or vomiting.   . rosuvastatin (CRESTOR) 20 MG tablet TAKE 1 TABLET BY MOUTH EVERY NIGHT AT BEDTIME   . SPIRIVA HANDIHALER 18 MCG inhalation capsule INHALE THE CONTENTS OF 1 CAPSULE VIA HANDIHALER BY MOUTH EVERY DAY   . warfarin (COUMADIN) 2.5 MG tablet Take 2.5 mg by mouth daily.   . fluconazole (DIFLUCAN) 150 MG tablet Take 1 tablet (150 mg total) by mouth once as needed. (Patient not taking: Reported on 05/28/2016) 05/28/2016: Patient said Dr.  Elie Confer told her not to take this.  . [DISCONTINUED] FLUoxetine (PROZAC) 40 MG capsule Take 40 mg by mouth daily.     No facility-administered encounter medications on file as of 05/28/2016.     Functional Status: In your present state of health, do you have any difficulty performing the following activities: 05/26/2016 05/16/2016  Hearing? N N  Vision? Y Y  Difficulty concentrating or making decisions? N N  Walking or climbing stairs? Y Y  Dressing or bathing? N N  Doing errands, shopping? Y Y  Preparing Food and eating ? - -  Using the Toilet? - -  In the past six months, have you accidently leaked urine? - -  Do you have problems with loss of bowel control? - -  Managing your Medications? - -  Managing your Finances? - -  Housekeeping or managing your Housekeeping? - -  Some recent data might be hidden    Fall/Depression Screening: Fall Risk  05/26/2016 05/16/2016 05/12/2016  Falls in the past year? Yes Yes Yes  Number falls in past yr: 2 or more 2 or more 2 or more  Injury with Fall? Yes Yes Yes  Risk Factor Category  High Fall Risk High Fall Risk High Fall Risk  Risk for fall due to : History of fall(s);Impaired balance/gait;Medication side effect History of fall(s);Impaired vision;Medication side effect;Impaired balance/gait History of fall(s);Impaired balance/gait;Impaired mobility;Impaired vision;Medication side effect  Risk for fall due to (comments): - - -  Follow up Falls prevention discussed Falls prevention discussed Follow up appointment   PHQ 2/9 Scores 05/26/2016 05/16/2016 05/12/2016 05/02/2016 04/10/2016 03/04/2016 02/18/2016  PHQ - 2 Score _0 0 1 0  PHQ- 9 Score 14 13 - 10 - - -      Assessment: Patient's medications were reconciled by comparing the medications in her chart, the discharge summary and via telephone interview.  Medication Review Findings:  Omitted medication- warfarin 2.66m was not on the patient's medication list.  She saw the anticoagulation clinic on  05/26/16 and her dose was titrated to:  Warfarin 2.511m1 tablet daily. (added to her medication list)  Dose Discrepancy- Potassium 20Meq- chart med list states 1 tablet twice daily patient reported taking 1 tablet daily. Medication list and the discharge medication list state Potassium 20 Meq 1 tablet twice daily (patient's last K was 3.9 05/09/16)    Plan:  1.  Route note to PCP.  2.  Consult with THHurley3.  Close pharmacy case as the patient was not interested in a pharmacy visit and says she has all she needs. Patient has my contact information and knows she can contact me for any future medication issues or concerns.  KaElayne GuerinPharmD, BCMcCaskilllinical Pharmacist ([phone]

## 2016-05-28 NOTE — Progress Notes (Signed)
Internal Medicine Clinic Attending  Case discussed with Dr. Arcelia Jew at the time of the visit.  We reviewed the resident's history and exam and pertinent patient test results.  I agree with the assessment, diagnosis, and plan of care documented in the resident's note.

## 2016-05-29 ENCOUNTER — Other Ambulatory Visit: Payer: Self-pay

## 2016-05-29 NOTE — Patient Outreach (Signed)
[name]) Care Management  05/29/2016   [name] [birth year] [id number]  Subjective:  I am doing okay. I don't want to walk outside, I might not be able to get back in.  Objective:  Telephonic encounter  Current Medications:  Current Outpatient Prescriptions  Medication Sig Dispense Refill  . albuterol (PROVENTIL HFA;VENTOLIN HFA) 108 (90 Base) MCG/ACT inhaler Inhale 2 puffs into the lungs every 6 (six) hours as needed for wheezing or shortness of breath.    . Blood Glucose Monitoring Suppl (ONETOUCH VERIO) W/DEVICE KIT 1 each by Does not apply route 4 (four) times daily. 1 kit 0  . chlorpheniramine (CHLOR-TRIMETON) 4 MG tablet Take 1 tablet (4 mg total) by mouth 3 (three) times daily as needed for allergies. 90 tablet 11  . cyclobenzaprine (FLEXERIL) 5 MG tablet Take 1 tablet (5 mg total) by mouth 3 (three) times daily as needed for muscle spasms. 90 tablet 3  . EASY TOUCH PEN NEEDLES 31G X 5 MM MISC USE TO INJECT INSULIN 5 TIMES DAILY 200 each 8  . ferrous sulfate 325 (65 FE) MG tablet Take 325 mg by mouth 2 (two) times daily with a meal.    . fluconazole (DIFLUCAN) 150 MG tablet Take 1 tablet (150 mg total) by mouth once as needed. (Patient not taking: Reported on 05/28/2016) 2 tablet 3  . FLUoxetine (PROZAC) 20 MG tablet Take 40 mg by mouth daily.    . fluticasone (FLONASE) 50 MCG/ACT nasal spray Place 2 sprays into both nostrils daily. 16 g 3  . Fluticasone-Salmeterol (ADVAIR DISKUS) 500-50 MCG/DOSE AEPB Inhale 1 puff into the lungs 2 (two) times daily. 180 each 3  . furosemide (LASIX) 80 MG tablet Take 1 tablet (80 mg total) by mouth daily. 90 tablet 3  . gabapentin (NEURONTIN) 300 MG capsule Take 2 capsules (600 mg total) by mouth 3 (three) times daily. 540 capsule 3  . HYDROcodone-acetaminophen (NORCO/VICODIN) 5-325 MG tablet Take 1-2 tablets by mouth every 6 (six) hours as needed for severe pain. 90 tablet 0  . insulin aspart (NOVOLOG) 100 UNIT/ML  FlexPen Inject 18 Units into the skin 3 (three) times daily with meals. 18 mL 11  . Insulin Detemir (LEVEMIR) 100 UNIT/ML Pen USE 30 UNITS IN THE MORNING AND 25 UNITS IN THE EVENING 18 mL 11  . Insulin Syringe-Needle U-100 31G X 15/64" 0.5 ML MISC Use to inject insulin up to 4 times a day 400 each 3  . Lancets Misc. (ACCU-CHEK FASTCLIX LANCET) KIT Check your blood 4 times a day dx code 250.00 insulin requiring 1 kit 2  . metoprolol succinate (TOPROL-XL) 25 MG 24 hr tablet Take 1 tablet (25 mg total) by mouth daily. 180 tablet 3  . omeprazole (PRILOSEC) 40 MG capsule Take 1 capsule (40 mg total) by mouth 2 (two) times daily. 180 capsule 3  . ONETOUCH VERIO test strip Check blood sugar up to 4 times a day 350 each 11  . OXYGEN Inhale 2 L into the lungs continuous.    . potassium chloride SA (K-DUR,KLOR-CON) 20 MEQ tablet TAKE 1 TABLET BY MOUTH TWICE DAILY 180 tablet 2  . promethazine (PHENERGAN) 12.5 MG tablet Take 1 tablet (12.5 mg total) by mouth every 8 (eight) hours as needed for nausea or vomiting. 30 tablet 0  . rosuvastatin (CRESTOR) 20 MG tablet TAKE 1 TABLET BY MOUTH EVERY NIGHT AT BEDTIME 90 tablet 2  . SPIRIVA HANDIHALER 18 MCG inhalation capsule INHALE THE CONTENTS OF 1 CAPSULE  VIA HANDIHALER BY MOUTH EVERY DAY 90 capsule 2  . warfarin (COUMADIN) 2.5 MG tablet Take 2.5 mg by mouth daily.     No current facility-administered medications for this visit.     Functional Status:  In your present state of health, do you have any difficulty performing the following activities: 05/26/2016 05/16/2016  Hearing? N N  Vision? Y Y  Difficulty concentrating or making decisions? N N  Walking or climbing stairs? Y Y  Dressing or bathing? N N  Doing errands, shopping? Y Y  Preparing Food and eating ? - -  Using the Toilet? - -  In the past six months, have you accidently leaked urine? - -  Do you have problems with loss of bowel control? - -  Managing your Medications? - -  Managing your  Finances? - -  Housekeeping or managing your Housekeeping? - -  Some recent data might be hidden    Fall/Depression Screening: Fall Risk  05/26/2016 05/16/2016 05/12/2016  Falls in the past year? Yes Yes Yes  Number falls in past yr: 2 or more 2 or more 2 or more  Injury with Fall? Yes Yes Yes  Risk Factor Category  High Fall Risk High Fall Risk High Fall Risk  Risk for fall due to : History of fall(s);Impaired balance/gait;Medication side effect History of fall(s);Impaired vision;Medication side effect;Impaired balance/gait History of fall(s);Impaired balance/gait;Impaired mobility;Impaired vision;Medication side effect  Risk for fall due to (comments): - - -  Follow up Falls prevention discussed Falls prevention discussed Follow up appointment   [facility] 2/9 Scores 05/26/2016 05/16/2016 05/12/2016 05/02/2016 04/10/2016 03/04/2016 02/18/2016  PHQ - 2 Score _0 0 1 0  PHQ- 9 Score 14 13 - 10 - - -   THN CM Care Plan Problem One     Most Recent Value  Care Plan Problem One  patient has multiple chronic illnesses  Role Documenting the Problem One  Care Management [place] for Problem One  Active  THN Long Term Goal (31-90 days)  In the next 31 days, patient will have no acute care admissions for heart failure  THN Long Term Goal Start Date  05/12/16  THN CM Short Term Goal #1 (0-30 days)  In the next 28 days, patient will have weighed herself 14 out of 28 times andrecorded the results  THN CM Short Term Goal #1 Start Date  05/12/16  Interventions for Short Term Goal #1  RNCM spoke with member via telephone to assess her progression, patient admits she has not been compliant  THN CM Short Term Goal #2 (0-30 days)  Patient will be in attendance toall her scheduled medical appointments for the next 28 days  THN CM Short Term Goal #2 Start Date  05/12/16  [facility] CM Short Term Goal #2 Met Date  05/29/16  Interventions for Short Term Goal #2  [facility] RNCM made telephone contact with member who reports  being compliant with medical appointments    [facility] CM Care Plan Problem Two     Most Recent Value  Care Plan Problem Two  patient has medication non-compliance  Role Documenting the Problem Two  Care Management [place] for Problem Two  Active  Interventions for Problem Two Long Term Goal   Patient stated by telephone she has been compliant with medication regimen  THN Long Term Goal (31-90) days  Patient will have self-administered her diurectics  31 out of 31 days  THN Long Term Goal  Start Date  05/12/16  THN CM Short Term Goal #1 (0-30 days)  in the next 28 days, patient will make and attend a post acute care discharge with her primary care physiican  [facility] CM Short Term Goal #1 Start Date  04/01/16  [facility] CM Short Term Goal #1 Met Date   05/29/16  Interventions for Short Term Goal #2   patient states she has been compliant with medical appointments     Assessment:  Patient reports compliance with medication regimen. Patient has not been compliant with daily weights, RNCM encouraged patient to start to weigh and record daily. Patient does not seem to be motivates to increase her activities, engage in social settings.  Plan:  Contact with patient in the next 21 days to assess her progress her progress in reaching her case manangement goals

## 2016-06-02 ENCOUNTER — Ambulatory Visit (INDEPENDENT_AMBULATORY_CARE_PROVIDER_SITE_OTHER): Payer: PPO | Admitting: Pharmacist

## 2016-06-02 ENCOUNTER — Encounter: Payer: Self-pay | Admitting: Internal Medicine

## 2016-06-02 ENCOUNTER — Ambulatory Visit (INDEPENDENT_AMBULATORY_CARE_PROVIDER_SITE_OTHER): Payer: PPO | Admitting: Internal Medicine

## 2016-06-02 VITALS — BP 163/38 | HR 66 | Temp 98.7°F | Wt 189.7 lb

## 2016-06-02 DIAGNOSIS — J309 Allergic rhinitis, unspecified: Secondary | ICD-10-CM

## 2016-06-02 DIAGNOSIS — Z7901 Long term (current) use of anticoagulants: Secondary | ICD-10-CM | POA: Diagnosis not present

## 2016-06-02 DIAGNOSIS — Z9989 Dependence on other enabling machines and devices: Secondary | ICD-10-CM

## 2016-06-02 DIAGNOSIS — F17210 Nicotine dependence, cigarettes, uncomplicated: Secondary | ICD-10-CM | POA: Diagnosis not present

## 2016-06-02 DIAGNOSIS — I480 Paroxysmal atrial fibrillation: Secondary | ICD-10-CM

## 2016-06-02 DIAGNOSIS — J301 Allergic rhinitis due to pollen: Secondary | ICD-10-CM

## 2016-06-02 LAB — POCT INR: INR: 2.1

## 2016-06-02 NOTE — Progress Notes (Signed)
Internal Medicine Clinic Attending  Case discussed with Dr. Boswell at the time of the visit.  We reviewed the resident's history and exam and pertinent patient test results.  I agree with the assessment, diagnosis, and plan of care documented in the resident's note.  

## 2016-06-02 NOTE — Progress Notes (Signed)
I confirmed Ms. Geisinger has been taking the KCl 20 mEq once daily for quite some time.  The most recent potassium level of 3.9 was on QD dosing.  I therefore corrected this in the medical record.

## 2016-06-02 NOTE — Patient Instructions (Signed)
Patient instructed to take medications as defined in the Anti-coagulation Track section of this encounter.  Patient instructed to take today's dose.  Patient instructed to take 1/2 tablet of your 35m peach-colored tablet of warfarin by mouth, once-daily at [facility]--EXCEPT on MONDAYS--take 1 tablet each Monday.  Patient verbalized understanding of these instructions.

## 2016-06-02 NOTE — Progress Notes (Signed)
Anti-Coagulation Progress Note  [name] is a 66 y.o. female who is currently on an anti-coagulation regimen.    RECENT RESULTS: Recent results are below, the most recent result is correlated with a dose of 17.5 mg. per week: Lab Results  Component Value Date   INR 2.10 06/02/2016   INR 1.0 05/26/2016   INR 1.07 04/21/2016    ANTI-COAG DOSE: Anticoagulation Dose Instructions as of 06/02/2016      [name] Tue Wed Thu Fri Sat   New Dose 2.5 mg 5 mg 2.5 mg 2.5 mg 2.5 mg 2.5 mg 2.5 mg    Description   Take 1/2 tablet of your 39m peach-colored tablet of warfarin by mouth, once-daily [facility]--EXCEPT on MONDAYS--take 1 tablet each Monday.       ANTICOAG SUMMARY: Anticoagulation Episode Summary    Current INR goal:   1.5-2.5  TTR:   79.7 % (3.2 y)  Next INR check:   06/23/2016  INR from last check:   2.10 (06/02/2016)  Weekly max dose:     Target end date:     INR check location[facility]   [facility]  Preferred lab:     Send INR reminders to:   [facility]ICOAG [facility]  Indications   Paroxysmal atrial fibrillation ([place] [I48.0] Long term current use of anticoagulant therapy [Z79.01]       Comments:         Anticoagulation Care Providers    Provider Role Specialty Phone number   BLelon Perla MD  Cardiology [phone]     ANTICOAG TODAY: Anticoagulation Summary  As of 06/02/2016   INR goal:   1.5-2.5  TTR:     Today's INR:   2.10  Next INR check:   06/23/2016  Target end date:      Indications   Paroxysmal atrial fibrillation ([place] [I48.0] Long term current use of anticoagulant therapy [Z79.01]        Anticoagulation Episode Summary    INR check location:   [facility]   Preferred lab:      Send INR reminders to:   ANTICOAG [facility] [address]   Comments:       Anticoagulation Care Providers    Provider Role Specialty Phone number   BLelon Perla MD  Cardiology [phone]     PATIENT INSTRUCTIONS: Patient Instructions   Patient instructed to take medications as defined in the Anti-coagulation Track section of this encounter.  Patient instructed to take today's dose.  Patient instructed to take 1/2 tablet of your 581mpeach-colored tablet of warfarin by mouth, [facility]--EXCEPT on MONDAYS--take 1 tablet each Monday.  Patient verbalized understanding of these instructions.       FOLLOW-UP Return in 3 weeks (on 06/23/2016) for Follow up INR at 1000h.  JaJorene GuestIII Pharm.D., CACP

## 2016-06-02 NOTE — Patient Instructions (Signed)
[name],  Your seasonal allergies are causing your problems. Continue the nasal spray and the oral antihistamine. I would recommend starting sinus irrigation 1-2 times a day. Also, be sure to clean your CPAP machine every day so that you aren't keeping pollen trapped in the machine.    Sinus Rinse What is a sinus rinse? A sinus rinse is a home treatment. It rinses your sinuses with a mixture of salt and water (saline solution). Sinuses are air-filled spaces in your skull behind the bones of your face and forehead. They open into your nasal cavity. To do a sinus rinse, you will need:  Saline solution.  Neti pot or spray bottle. This releases the saline solution into your nose and through your sinuses. You can buy neti pots and spray bottles at:  Your local pharmacy.  A health food store.  Online. When should I do a sinus rinse? A sinus rinse can help to clear your nasal cavity. It can clear:  Mucus.  Dirt.  Dust.  Pollen. You may do a sinus rinse when you have:  A cold.  A virus.  Allergies.  A sinus infection.  A stuffy nose. If you are considering a sinus rinse:  Ask your child's doctor before doing a sinus rinse on your child.  Do not do a sinus rinse if you have had:  Ear or nasal surgery.  An ear infection.  Blocked ears. How do I do a sinus rinse?  Wash your hands.  Disinfect your device using the directions that came with the device.  Dry your device.  Use the solution that comes with your device or one that is sold separately in stores. Follow the mixing directions on the package.  Fill your device with the amount of saline solution as stated in the device instructions.  Stand over a sink and tilt your head sideways over the sink.  Place the spout of the device in your upper nostril (the one closer to the ceiling).  Gently pour or squeeze the saline solution into the nasal cavity. The liquid should drain to the lower nostril if you are not  too congested.  Gently blow your nose. Blowing too hard may cause ear pain.  Repeat in the other nostril.  Clean and rinse your device with clean water.  Air-dry your device. Are there risks of a sinus rinse? Sinus rinse is normally very safe and helpful. However, there are a few risks, which include:  A burning feeling in the sinuses. This may happen if you do not make the saline solution as instructed. Make sure to follow all directions when making the saline solution.  Infection from unclean water. This is rare, but possible.  Nasal irritation. This information is not intended to replace advice given to you by your health care provider. Make sure you discuss any questions you have with your health care provider. Document Released: 08/24/2013 Document Revised: 12/25/2015 Document Reviewed: 06/14/2013 Elsevier Interactive Patient Education  2017 [employer].

## 2016-06-02 NOTE — Assessment & Plan Note (Addendum)
[name] presents today with complaints of soreness and pain in her left nostril. She reports that over the weekend she noted the pain and since she was coming in today for an INR check made an appointment to be seen. Reports rhinorrhea, watery and itching eyes. Denies any sore throat, ear pain, sinus congestion/pain. No fevers or chills. She has [place] on today. Reports she does not wear it while at home and only uses it when she is out. Uses CPAP qhs.   Nothing visible on exam today.  Reports using her Flonase daily as well as chlorpheniramine.   Assessment: allergic rhinitis  Plan: Continue flonase and chlorpheniramine Recommended sinus irrigation Clean CPAP at home

## 2016-06-02 NOTE — Progress Notes (Signed)
CC: sore nose  HPI:  [name] is a 66 y.o. female with a past medical history listed below here today with complaints of feeling sore inside her nose.  For details of today's visit and the status of her chronic medical issues please refer to the assessment and plan.   Past Medical History:  Diagnosis Date  . Adenomatous polyps 05/14/2011   Colonoscopy (05/2011): 4 mm adenomatous polyp excised endoscopically Colonoscopy (02/2002): Adenomatous polyp excised endoscopically   . Allergic rhinitis 06/01/2012  . Anemia of chronic disease 01/01/2013  . Anxiety 07/24/2010  . Aortic atherosclerosis ([place]) 10/19/2014   Seen on CT scan, currently asymptomatic  . 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 Asymptomatic cholelithiasis 09/25/2015   Seen on CT scan 08/2015  . Bilateral cataracts 08/04/2012   Visually insignificant   . Carotid artery stenosis    s/p right endarterectomy (06/2010) Carotid US (07/2010):  Left: Moderate-to-severe (60-79%) calcific and non-calcific plaque origin and proximal ICA and ECA   . Chronic congestive heart failure with left ventricular diastolic dysfunction ([place]) 10/21/2010  . Chronic constipation 02/03/2011  . Chronic daily headache 01/16/2014  . Chronic low back pain 10/06/2012  . Chronic venous insufficiency 08/04/2012  . Clear cell renal cell carcinoma ([place]) 07/21/2011   s/p cryoablation of left RCC in 09/2011 by [name]. Followed by [name]  [facility]) .    Marland Kitchen COPD (chronic obstructive pulmonary disease) with emphysema (HCC)    PFTs 02/2012: FEV1 0.92 (40%), ratio 69, 27% increase in FEV1 with BD, TLC 91%, severe airtrapping, DLCO49% On chronic home O2. Pulmonary rehab referral 05/2012   . Depression 11/19/2005  . Esophageal stenosis 08/08/2015   Mild, benign-appearing on EGD 06/29/2015  . Fibromyalgia 08/29/2010  . Gastroesophageal reflux disease   .  History of blood transfusion    "several times"   . Hyperlipidemia LDL goal < 100 11/20/2005  . Internal hemorrhoids 08/04/2012  . Mitral stenosis    s/p Mitral valve replacement with a 27-mm pericardial porcine valve (Medtronic Mosaic valve, serial #[id number] on 09/20/10, Dr. Prescott Gum)   . Moderate to severe pulmonary hypertension ([place])    2014 TEE w PA peak pressure 46 mmHg, s/p MV replacement   . Obesity (BMI 30.0-34.9) 10/23/2011  . Obstructive sleep apnea    Nocturnal polysomnography (06/2009): Moderate sleep apnea/ hypopnea syndrome , AHI 17.8 per hour with nonpositional hypopneas. CPAP titration to 12 CWP, AHI 2.4 per hour. On nocturnal CPAP via a small resMed Quattro full-face mask with heated humidifier.   . Osteoporosis    DEXA (12/09/2011): L-spine T -3.7, left hip T -1.4 DEXA (12/2004): L-spine T -2.6, left hip -0.1   . Paroxysmal atrial fibrillation ([place]) 10/22/2010   s/p Left atrial maze procedure for paroxysmal atrial fibrillation on 09/20/2010 by Dr Prescott Gum.  Subsequent splenic infarct, decision was made to re-anticoagulate with coumadin, likely life-long as this is the most likely cause of the splenic infarct.   . Pulmonary hypertension due to chronic obstructive pulmonary disease ([place]) 04/25/2016  . Right nephrolithiasis 09/06/2014   5 mm non-obstructing calculus seen on CT scan 09/05/2014   . Seborrheic keratosis 09/28/2015  . Shortness of breath dyspnea   . Tobacco abuse 07/28/2012  . Type 2 diabetes mellitus with diabetic neuropathy ([place])     Review of Systems:   See HPI  Physical Exam:  Vitals:   06/02/16 1006  BP: (!) 171/43  Pulse: 66  Temp: 98.7 F (37.1 C)  TempSrc: Oral  SpO2: 99%  Weight: 189 lb 11.2 oz (86 kg)   Physical Exam  Constitutional: She is well-developed, well-nourished, and in no distress.  HENT:  Head: Normocephalic and atraumatic.  Nose: Nose normal.  Mouth/Throat: Oropharynx is clear and moist. No oropharyngeal exudate.  Eyes:  Conjunctivae are normal. Pupils are equal, round, and reactive to light.  Neck: Neck supple.  Cardiovascular: Normal rate and regular rhythm.   Pulmonary/Chest: Effort normal and breath sounds normal.  Lymphadenopathy:    She has no cervical adenopathy.  Vitals reviewed.   Assessment & Plan:   See Encounters Tab for problem based charting.  Patient discussed with Dr. Lynnae January

## 2016-06-04 ENCOUNTER — Other Ambulatory Visit: Payer: Self-pay

## 2016-06-04 NOTE — Patient Outreach (Signed)
[facility]) Care Management   06/04/2016  [name] 04-Aug-1950 [id number]  [name] is an 66 y.o. female  Subjective:  I have been having some good days and some bad days.  Objective:   ROS  Very petite in height, well groomed elderly lady  Physical Exam   ROS  Encounter Medications:   Outpatient Encounter Prescriptions as of 06/04/2016  Medication Sig Note  . albuterol (PROVENTIL HFA;VENTOLIN HFA) 108 (90 Base) MCG/ACT inhaler Inhale 2 puffs into the lungs every 6 (six) hours as needed for wheezing or shortness of breath.   . Blood Glucose Monitoring Suppl (ONETOUCH VERIO) W/DEVICE KIT 1 each by Does not apply route 4 (four) times daily.   . chlorpheniramine (CHLOR-TRIMETON) 4 MG tablet Take 1 tablet (4 mg total) by mouth 3 (three) times daily as needed for allergies.   . cyclobenzaprine (FLEXERIL) 5 MG tablet Take 1 tablet (5 mg total) by mouth 3 (three) times daily as needed for muscle spasms.   Marland Kitchen EASY TOUCH PEN NEEDLES 31G X 5 MM MISC USE TO INJECT INSULIN 5 TIMES DAILY   . ferrous sulfate 325 (65 FE) MG tablet Take 325 mg by mouth 2 (two) times daily with a meal.   . fluconazole (DIFLUCAN) 150 MG tablet Take 1 tablet (150 mg total) by mouth once as needed. (Patient not taking: Reported on 06/02/2016) 05/28/2016: Patient said Dr. Elie Confer told her not to take this.  Marland Kitchen FLUoxetine (PROZAC) 20 MG tablet Take 40 mg by mouth daily.   . fluticasone (FLONASE) 50 MCG/ACT nasal spray Place 2 sprays into both nostrils daily.   . Fluticasone-Salmeterol (ADVAIR DISKUS) 500-50 MCG/DOSE AEPB Inhale 1 puff into the lungs 2 (two) times daily.   . furosemide (LASIX) 80 MG tablet Take 1 tablet (80 mg total) by mouth daily.   Marland Kitchen gabapentin (NEURONTIN) 300 MG capsule Take 2 capsules (600 mg total) by mouth 3 (three) times daily.   Marland Kitchen HYDROcodone-acetaminophen (NORCO/VICODIN) 5-325 MG tablet Take 1-2 tablets by mouth every 6 (six) hours as needed for severe pain.   Marland Kitchen  insulin aspart (NOVOLOG) 100 UNIT/ML FlexPen Inject 18 Units into the skin 3 (three) times daily with meals.   . Insulin Detemir (LEVEMIR) 100 UNIT/ML Pen USE 30 UNITS IN THE MORNING AND 25 UNITS IN THE EVENING   . Insulin Syringe-Needle U-100 31G X 15/64" 0.5 ML MISC Use to inject insulin up to 4 times a day   . Lancets Misc. (ACCU-CHEK FASTCLIX LANCET) KIT Check your blood 4 times a day dx code 250.00 insulin requiring   . metoprolol succinate (TOPROL-XL) 25 MG 24 hr tablet Take 1 tablet (25 mg total) by mouth daily.   Marland Kitchen omeprazole (PRILOSEC) 40 MG capsule Take 1 capsule (40 mg total) by mouth 2 (two) times daily.   Glory Rosebush VERIO test strip Check blood sugar up to 4 times a day   . OXYGEN Inhale 2 L into the lungs continuous.   . potassium chloride SA (K-DUR,KLOR-CON) 20 MEQ tablet Take 20 mEq by mouth daily.   . promethazine (PHENERGAN) 12.5 MG tablet Take 1 tablet (12.5 mg total) by mouth every 8 (eight) hours as needed for nausea or vomiting.   . rosuvastatin (CRESTOR) 20 MG tablet TAKE 1 TABLET BY MOUTH EVERY NIGHT AT BEDTIME   . SPIRIVA HANDIHALER 18 MCG inhalation capsule INHALE THE CONTENTS OF 1 CAPSULE VIA HANDIHALER BY MOUTH EVERY DAY   . warfarin (COUMADIN) 2.5 MG tablet Take 2.5  mg by mouth daily.    No facility-administered encounter medications on file as of 06/04/2016.     Functional Status:   In your present state of health, do you have any difficulty performing the following activities: 06/02/2016 05/26/2016  Hearing? N N  Vision? Y Y  Difficulty concentrating or making decisions? N N  Walking or climbing stairs? Y Y  Dressing or bathing? N N  Doing errands, shopping? N Y  Conservation officer, nature and eating ? - -  Using the Toilet? - -  In the past six months, have you accidently leaked urine? - -  Do you have problems with loss of bowel control? - -  Managing your Medications? - -  Managing your Finances? - -  Housekeeping or managing your Housekeeping? - -  Some recent data  might be hidden    Fall/Depression Screening:    PHQ 2/9 Scores 06/02/2016 05/26/2016 05/16/2016 05/12/2016 05/02/2016 04/10/2016 03/04/2016  PHQ - 2 Score _0 0 1  PHQ- 9 Score _1 - 10 - -   THN CM Care Plan Problem One     Most Recent Value  Care Plan Problem One  patient has multiple chronic illnesses  Role Documenting the Problem One  Care Management Coordinator  Care Plan for Problem One  Active  THN Long Term Goal (31-90 days)  In the next 31 days, patient will have no acute care admissions for heart failure  THN Long Term Goal Start Date  05/12/16  Interventions for Problem One Long Term Goal  home visit to assess progress towards reaching this goal.  patient reports compliance in attending her appointments with medical providers  THN CM Short Term Goal #1 (0-30 days)  In the next 28 days, patient will have weighed herself 14 out of 28 times andrecorded the results  THN CM Short Term Goal #1 Start Date  05/12/16  Interventions for Short Term Goal #1  patient states she has been weighing more, not every day.  so far she has weighed 7 times   THN CM Short Term Goal #2 (0-30 days)  Patient will be in attendance toall her scheduled medical appointments for the next 28 days  THN CM Short Term Goal #2 Start Date  05/12/16  [facility] CM Short Term Goal #2 Met Date  05/29/16  Interventions for Short Term Goal #2  [facility] RNCM made telephone contact with member who reports being compliant with medical appointments    [facility] CM Care Plan Problem Two     Most Recent Value  Care Plan Problem Two  patient has medication non-compliance  Role Documenting the Problem Two  Care Management [place] for Problem Two  Active  Interventions for Problem Two Long Term Goal   patient states she has not taken her fluid pills for2 days.    THN Long Term Goal (31-90) days  Patient will have self-administered her diurectics  31 out of 31 days  THN Long Term Goal Start Date  05/12/16  THN CM Short Term  Goal #1 (0-30 days)  in the next 28 days, patient will make and attend a post acute care discharge with her primary care physiican  [facility] CM Short Term Goal #1 Start Date  04/01/16  [facility] CM Short Term Goal #1 Met Date   05/29/16  Interventions for Short Term Goal #2   patient states she has been compliant with medical appointments     Assessment:   Patient  states she has been having good days and bad days. Patient reports having problems with her allergies, has talked to Dr. Eppie Gibson about this. Patient continues to praise care received at IM at [facility]. Patient is not consistent with daily weights and medication regimen Patient reports sporadic eating happens   Plan:  Telephone contact in the next 21 days to assess progression in reaching her case management

## 2016-06-05 ENCOUNTER — Other Ambulatory Visit: Payer: Self-pay | Admitting: *Deleted

## 2016-06-05 DIAGNOSIS — I480 Paroxysmal atrial fibrillation: Secondary | ICD-10-CM

## 2016-06-05 MED ORDER — WARFARIN SODIUM 2.5 MG PO TABS: ORAL_TABLET | ORAL | 1 refills | Status: DC

## 2016-06-05 NOTE — Telephone Encounter (Signed)
Fax from [employer] - pt stated she has been restarted on Warfarin; needs new rx  Thanks

## 2016-06-06 ENCOUNTER — Ambulatory Visit: Payer: PPO

## 2016-06-11 ENCOUNTER — Telehealth: Payer: Self-pay

## 2016-06-11 ENCOUNTER — Other Ambulatory Visit (HOSPITAL_COMMUNITY): Payer: Self-pay

## 2016-06-11 MED ORDER — METOPROLOL SUCCINATE ER 25 MG PO TB24: 25.0000 mg | ORAL_TABLET | Freq: Every day | ORAL | 3 refills | Status: DC

## 2016-06-11 NOTE — Telephone Encounter (Signed)
Asking to speak with Bonnita Nasuti.

## 2016-06-12 ENCOUNTER — Other Ambulatory Visit: Payer: Self-pay | Admitting: *Deleted

## 2016-06-12 DIAGNOSIS — E1140 Type 2 diabetes mellitus with diabetic neuropathy, unspecified: Secondary | ICD-10-CM

## 2016-06-12 DIAGNOSIS — Z794 Long term (current) use of insulin: Principal | ICD-10-CM

## 2016-06-12 DIAGNOSIS — F329 Major depressive disorder, single episode, unspecified: Secondary | ICD-10-CM

## 2016-06-12 MED ORDER — FLUOXETINE HCL 20 MG PO TABS: 40.0000 mg | ORAL_TABLET | Freq: Every day | ORAL | 3 refills | Status: DC

## 2016-06-12 MED ORDER — INSULIN ASPART 100 UNIT/ML FLEXPEN: 18.0000 [IU] | PEN_INJECTOR | Freq: Three times a day (TID) | SUBCUTANEOUS | 10 refills | Status: DC

## 2016-06-12 NOTE — Telephone Encounter (Signed)
Asking to speak with [name].

## 2016-06-13 ENCOUNTER — Ambulatory Visit (HOSPITAL_COMMUNITY)
Admission: RE | Admit: 2016-06-13 | Discharge: 2016-06-13 | Disposition: A | Discharge status: Home / Self Care | Payer: PPO | Source: Ambulatory Visit | Attending: Cardiology | Admitting: Cardiology

## 2016-06-13 ENCOUNTER — Encounter (HOSPITAL_COMMUNITY): Payer: Self-pay

## 2016-06-13 VITALS — Wt 189.0 lb

## 2016-06-13 DIAGNOSIS — I050 Rheumatic mitral stenosis: Secondary | ICD-10-CM | POA: Insufficient documentation

## 2016-06-13 DIAGNOSIS — J449 Chronic obstructive pulmonary disease, unspecified: Secondary | ICD-10-CM | POA: Insufficient documentation

## 2016-06-13 DIAGNOSIS — I480 Paroxysmal atrial fibrillation: Secondary | ICD-10-CM | POA: Diagnosis not present

## 2016-06-13 DIAGNOSIS — I509 Heart failure, unspecified: Secondary | ICD-10-CM

## 2016-06-13 DIAGNOSIS — Z794 Long term (current) use of insulin: Secondary | ICD-10-CM | POA: Diagnosis not present

## 2016-06-13 DIAGNOSIS — I272 Pulmonary hypertension, unspecified: Secondary | ICD-10-CM | POA: Insufficient documentation

## 2016-06-13 DIAGNOSIS — E785 Hyperlipidemia, unspecified: Secondary | ICD-10-CM | POA: Insufficient documentation

## 2016-06-13 DIAGNOSIS — G4733 Obstructive sleep apnea (adult) (pediatric): Secondary | ICD-10-CM | POA: Diagnosis not present

## 2016-06-13 DIAGNOSIS — I2510 Atherosclerotic heart disease of native coronary artery without angina pectoris: Secondary | ICD-10-CM | POA: Insufficient documentation

## 2016-06-13 DIAGNOSIS — H26492 Other secondary cataract, left eye: Secondary | ICD-10-CM | POA: Diagnosis not present

## 2016-06-13 DIAGNOSIS — Z7901 Long term (current) use of anticoagulants: Secondary | ICD-10-CM | POA: Insufficient documentation

## 2016-06-13 DIAGNOSIS — Z961 Presence of intraocular lens: Secondary | ICD-10-CM | POA: Diagnosis not present

## 2016-06-13 DIAGNOSIS — I5032 Chronic diastolic (congestive) heart failure: Secondary | ICD-10-CM | POA: Insufficient documentation

## 2016-06-13 DIAGNOSIS — E119 Type 2 diabetes mellitus without complications: Secondary | ICD-10-CM | POA: Diagnosis not present

## 2016-06-13 DIAGNOSIS — F17200 Nicotine dependence, unspecified, uncomplicated: Secondary | ICD-10-CM | POA: Diagnosis not present

## 2016-06-13 DIAGNOSIS — Z953 Presence of xenogenic heart valve: Secondary | ICD-10-CM | POA: Diagnosis not present

## 2016-06-13 DIAGNOSIS — I50810 Right heart failure, unspecified: Secondary | ICD-10-CM

## 2016-06-13 LAB — CBC
HCT: 34.8 % — ABNORMAL LOW (ref 36.0–46.0)
Hemoglobin: 10.9 g/dL — ABNORMAL LOW (ref 12.0–15.0)
MCH: 28.6 pg (ref 26.0–34.0)
MCHC: 31.3 g/dL (ref 30.0–36.0)
MCV: 91.3 fL (ref 78.0–100.0)
Platelets: 235 10*3/uL (ref 150–400)
RBC: 3.81 MIL/uL — ABNORMAL LOW (ref 3.87–5.11)
RDW: 15.6 % — ABNORMAL HIGH (ref 11.5–15.5)
WBC: 7.5 10*3/uL (ref 4.0–10.5)

## 2016-06-13 LAB — BASIC METABOLIC PANEL
Anion gap: 10 (ref 5–15)
BUN: 16 mg/dL (ref 6–20)
CO2: 26 mmol/L (ref 22–32)
Calcium: 9 mg/dL (ref 8.9–10.3)
Chloride: 101 mmol/L (ref 101–111)
Creatinine, Ser: 0.88 mg/dL (ref 0.44–1.00)
GFR calc Af Amer: >60 mL/min (ref >60)
GFR calc non Af Amer: >60 mL/min (ref >60)
Glucose, Bld: 190 mg/dL — ABNORMAL HIGH (ref 65–99)
Potassium: 4.2 mmol/L (ref 3.5–5.1)
Sodium: 137 mmol/L (ref 135–145)

## 2016-06-13 LAB — BRAIN NATRIURETIC PEPTIDE: B Natriuretic Peptide: 86 pg/mL (ref 0.0–100.0)

## 2016-06-13 MED ORDER — FUROSEMIDE 80 MG PO TABS: ORAL_TABLET | ORAL | 3 refills | Status: DC

## 2016-06-13 MED ORDER — POTASSIUM CHLORIDE CRYS ER 20 MEQ PO TBCR: 20.0000 meq | EXTENDED_RELEASE_TABLET | Freq: Two times a day (BID) | ORAL | 3 refills | Status: DC

## 2016-06-13 NOTE — Patient Instructions (Signed)
Labs today (will call for abnormal results, otherwise no news is good news)  INCREASE Lasix to 80 mg (1 Tablet) in the AM and 40 mg (0.5 Tablet) in the PM.  INCREASE Potassium to 20 mEq (1 Tablet) Two times daily.  Labs in 10 Days (BMET)  Follow up in clinic in 3 Weeks

## 2016-06-15 NOTE — Progress Notes (Signed)
PCP: Dr. Eppie Gibson HF Cardiology: Dr. Aundra Dubin  66 yo with COPD, mitral stenosis s/p bioprosthetic MV replacement, paroxysmal atrial fibrillation, GI bleeding, and pulmonary hypertension/RV failure presents for cardiology followup.  She was admitted with GI bleeding, thought to be AVMs, in 2/18 and warfarin was stopped.  She was admitted in 3/18 with chest pain, dyspnea, and elevated troponin.  She had not been taking her Lasix regularly.  Coronary angiography showed nonobstructive CAD, suspect demand ischemia. RHC showed elevated right and left heart filling pressures and severe pulmonary hypertension.  V/Q scan showed no chronic PE, and PFTs showed severe obstructive lung disease.  Echo showed severe RV dilation with mildly decreased systolic function.  She was diuresed and eventually discharged to [facility].   She is now back at home.  She has home oxygen but does not use regularly.  She uses her CPAP.  She is still short of breath walking short distances.  Not very active. No orthopnea/PND. No chest pain.  She is back to smoking.  I started her on coumadin with INR goal 1.5-2.5. She has tolerated this so far.  No melena/BRBPR. She has had several falls, these seem to occur when she falls asleep in a chair then slides out.   Labs (3/18): K 4.8 => 3.9, creatinine 1.2 => 1.09, hgb 10.2 => 10.7, BNP 131  PMH: 1. Mitral stenosis: s/p bioprosthetic mitral valve replacement.  2. COPD: Severe by 3/18 PFTs.  She is still smoking.  3. Atrial fibrillation: Paroxysmal. Has been off warfarin since GI bleed in 2/18.  4. OSA: Uses CPAP.  5. Type II diabetes. 6. Pulmonary hypertension/RV failure: Suspect primarily group 3 PH due to underlying lung disease (COPD).  - RHC (3/18) with mean RA 14, PA 77/28 mean 47, mean PCWP 23, CI 2.6, PVR 4.9.  - Echo (3/18): EF 60-65%, severe RV dilation with mildly decreased RV systolic function, PASP 76 mmHg.  Bioprosthetic MV looks ok.  - V/Q scan (3/18): No evidence for  chronic PE.  - PFTs (3/18): Severe obstructive airways disease.  7. Anemia: Fe deficiency and anemia of chronic disease.   - Suspect GI AVMs as cause of 2/18 bleed . Coumadin stopped.  8. CAD: LHC (3/18) with 30% LM, 40% proximal RCA.  9. Hyperlipidemia  SH: Lives alone in [place].  Active smoker.  No ETOH.   Family History  Problem Relation Age of Onset  . Peptic Ulcer Disease Father   . Heart attack Father 12    Died of MI at age 67  . Heart attack Brother 63    Died of MI at age 36  . Obesity Brother   . Pneumonia Mother   . Healthy Sister   . Lupus Daughter   . Obsessive Compulsive Disorder Daughter    ROS: All systems reviewed and negative except as per HPI.   Current Outpatient Prescriptions  Medication Sig Dispense Refill  . albuterol (PROVENTIL HFA;VENTOLIN HFA) 108 (90 Base) MCG/ACT inhaler Inhale 2 puffs into the lungs every 6 (six) hours as needed for wheezing or shortness of breath.    . Blood Glucose Monitoring Suppl (ONETOUCH VERIO) W/DEVICE KIT 1 each by Does not apply route 4 (four) times daily. 1 kit 0  . chlorpheniramine (CHLOR-TRIMETON) 4 MG tablet Take 1 tablet (4 mg total) by mouth 3 (three) times daily as needed for allergies. 90 tablet 11  . cyclobenzaprine (FLEXERIL) 5 MG tablet Take 1 tablet (5 mg total) by mouth 3 (three) times daily as  needed for muscle spasms. 90 tablet 3  . EASY TOUCH PEN NEEDLES 31G X 5 MM MISC USE TO INJECT INSULIN 5 TIMES DAILY 200 each 8  . ferrous sulfate 325 (65 FE) MG tablet Take 325 mg by mouth 2 (two) times daily with a meal.    . fluconazole (DIFLUCAN) 150 MG tablet Take 1 tablet (150 mg total) by mouth once as needed. 2 tablet 3  . FLUoxetine (PROZAC) 20 MG tablet Take 2 tablets (40 mg total) by mouth daily. 180 tablet 3  . fluticasone (FLONASE) 50 MCG/ACT nasal spray Place 2 sprays into both nostrils daily. 16 g 3  . Fluticasone-Salmeterol (ADVAIR DISKUS) 500-50 MCG/DOSE AEPB Inhale 1 puff into the lungs 2 (two) times  daily. 180 each 3  . furosemide (LASIX) 80 MG tablet Take 80 mg (1 Tablet) in the AM, and 40 mg (0.5 Tablet) in the PM. 135 tablet 3  . gabapentin (NEURONTIN) 300 MG capsule Take 2 capsules (600 mg total) by mouth 3 (three) times daily. 540 capsule 3  . HYDROcodone-acetaminophen (NORCO/VICODIN) 5-325 MG tablet Take 1-2 tablets by mouth every 6 (six) hours as needed for severe pain. 90 tablet 0  . insulin aspart (NOVOLOG) 100 UNIT/ML FlexPen Inject 18 Units into the skin 3 (three) times daily with meals. 18 mL 10  . Insulin Detemir (LEVEMIR) 100 UNIT/ML Pen USE 30 UNITS IN THE MORNING AND 25 UNITS IN THE EVENING 18 mL 11  . Insulin Syringe-Needle U-100 31G X 15/64" 0.5 ML MISC Use to inject insulin up to 4 times a day 400 each 3  . Lancets Misc. (ACCU-CHEK FASTCLIX LANCET) KIT Check your blood 4 times a day dx code 250.00 insulin requiring 1 kit 2  . metoprolol succinate (TOPROL-XL) 25 MG 24 hr tablet Take 1 tablet (25 mg total) by mouth daily. 30 tablet 3  . omeprazole (PRILOSEC) 40 MG capsule Take 1 capsule (40 mg total) by mouth 2 (two) times daily. 180 capsule 3  . ONETOUCH VERIO test strip Check blood sugar up to 4 times a day 350 each 11  . OXYGEN Inhale 2 L into the lungs continuous.    . potassium chloride SA (K-DUR,KLOR-CON) 20 MEQ tablet Take 1 tablet (20 mEq total) by mouth 2 (two) times daily. 60 tablet 3  . promethazine (PHENERGAN) 12.5 MG tablet Take 1 tablet (12.5 mg total) by mouth every 8 (eight) hours as needed for nausea or vomiting. 30 tablet 0  . rosuvastatin (CRESTOR) 20 MG tablet TAKE 1 TABLET BY MOUTH EVERY NIGHT AT BEDTIME 90 tablet 2  . SPIRIVA HANDIHALER 18 MCG inhalation capsule INHALE THE CONTENTS OF 1 CAPSULE VIA HANDIHALER BY MOUTH EVERY DAY 90 capsule 2  . warfarin (COUMADIN) 2.5 MG tablet TAKE 1/2 TABLET (2.5 MG) DAILY EXCEPT ON MONDAYS TAKE 1 TABLET (5 MG) 20 tablet 1   No current facility-administered medications for this encounter.    Wt 189 lb (85.7 kg)   BMI  34.57 kg/m  General: NAD Neck: Thick, JVP 10 cm, no thyromegaly or thyroid nodule.  Lungs: Distant breath sounds bilaterally.  CV: Nondisplaced PMI.  Heart regular S1/S2, no S3/S4, no murmur.  1+ edema 1/2 up lower legs bilaterally.  No carotid bruit.  Normal pedal pulses.  Abdomen: Soft, nontender, no hepatosplenomegaly, no distention.  Skin: Intact without lesions or rashes.  Neurologic: Alert and oriented x 3.  Psych: Normal affect. Extremities: No clubbing or cyanosis.  HEENT: Normal.   Assessment/Plan: 1. Pulmonary hypertension: I suspect  that this is primarily group 2 PH (pulmonary venous hypertension from LV diastolic dysfunction) and group 3 PH (severe COPD, OSA).  V/Q scan showed no evidence for chronic PE.  I do not think that she will benefit from selective pulmonary vasodilators.  I think that the key here will be good diuresis + use of oxygen and CPAP.  2. Chronic diastolic CHF with prominent RV dysfunction in the setting of severe COPD.  She is volume overloaded on exam with NYHA class IIIb symptoms (dyspnea is likely a combination of CHF and COPD).  - Increase Lasix to 80 qam/40 qpm and KCl to 20 bid.  BMET/BNP today and repeat BMET in 10 days.  3. Atrial fibrillation: Paroxysmal.  She is in NSR by exam today.  No melena or BRBPR.  She is not a good candidate for Eliquis or Xarelto with bioprosthetic MV.  She had been off anticoagulation due to GI bleeding.  I restarted her on warfarin with INR goal 1.5-2.5.  She has tolerated this so far.  - Keep INR 1.5-2.5 for now.   - CBC today.   4. h/o GI bleeding: Possible GI AVM.  No overt bleeding after starting coumadin with lower INR goal. 5. COPD: Severe by PFTs. Unfortunately, she is back to smoking.  I encouraged her to quit.  6. CAD: Nonobstructive. She is on a statin.   Followup in 3 wks with PA/NP.   Loralie Champagne 06/15/2016

## 2016-06-16 NOTE — Telephone Encounter (Signed)
Spoke w/ pt last week, did some refills, reassured

## 2016-06-20 ENCOUNTER — Other Ambulatory Visit: Payer: Self-pay

## 2016-06-20 NOTE — Patient Outreach (Signed)
    Unsuccessful attempt made to contact patient via telephone for community care coordination. Plan: Make another attempt to contact patient via telephone in the next 30 days

## 2016-06-23 ENCOUNTER — Ambulatory Visit (INDEPENDENT_AMBULATORY_CARE_PROVIDER_SITE_OTHER): Payer: PPO | Admitting: Pharmacist

## 2016-06-23 DIAGNOSIS — Z7901 Long term (current) use of anticoagulants: Secondary | ICD-10-CM

## 2016-06-23 DIAGNOSIS — I480 Paroxysmal atrial fibrillation: Secondary | ICD-10-CM

## 2016-06-23 LAB — POCT INR: INR: 5.4

## 2016-06-23 NOTE — Patient Instructions (Signed)
Patient instructed to take medications as defined in the Anti-coagulation Track section of this encounter.  Patient instructed to OMT today's dose. OMIT tomorrow's dose of warfarin. On Wednesday, 16-MAY-18, resume taking warfarin by taking 1/2 tablet of your peach-colored 2m strength warfarin tablets by mouth, once-daily at [facility]. Patient verbalized understanding of these instructions.

## 2016-06-23 NOTE — Progress Notes (Signed)
Anti-Coagulation Progress Note  HYDIA COPELIN is a 66 y.o. female who is currently on an anti-coagulation regimen for atrial fibrillation and long term use of anticoagulation. To remain on warfarin therapy. INR goal targeted lower by her cardiologist because of her recent GIB.    RECENT RESULTS: Recent results are below, the most recent result is correlated with a dose of 20 mg. per week: Lab Results  Component Value Date   INR 5.40 06/23/2016   INR 2.10 06/02/2016   INR 1.0 05/26/2016    ANTI-COAG DOSE: Anticoagulation Warfarin Dose Instructions as of 06/23/2016      [name] Tue Wed Thu Fri Sat   New Dose 2.5 mg Hold Hold 2.5 mg 2.5 mg 2.5 mg 2.5 mg    Description   OMIT today and tomorrow's doses of warfarin. On Wednesday 16-MAY-18, resume warfarin by taking 1/2 tablet of your peach-colored 4m strength warfarin tablet by mouth daily at 6PM.      ANTICOAG SUMMARY: Anticoagulation Episode Summary    Current INR goal:   1.5-2.5  TTR:   78.5 % (3.2 y)  Next INR check:   06/30/2016  INR from last check:   5.40! (06/23/2016)  Weekly max warfarin dose:     Target end date:     INR check [facility]   [facility]  Preferred lab:     Send INR reminders to:   [facility]AG [facility]  Indications   Paroxysmal atrial fibrillation ([place] [I48.0] Long term current use of anticoagulant therapy [Z79.01]       Comments:         Anticoagulation Care Providers    Provider Role Specialty Phone number   CLelon Perla MD  Cardiology [phone]     ANTICOAG TODAY: Anticoagulation Summary  As of 06/23/2016   INR goal:   1.5-2.5  TTR:     Today's INR:   5.40!  Next INR check:   06/30/2016  Target end date:      Indications   Paroxysmal atrial fibrillation ([place] [I48.0] Long term current use of anticoagulant therapy [Z79.01]        Anticoagulation Episode Summary    INR check location[facility]   [facility]   Preferred lab:      Send INR reminders to:   ANTICOAG  [facility]  Comments:       Anticoagulation Care Providers    Provider Role Specialty Phone number   CLelon Perla MD  Cardiology [phone]     PATIENT INSTRUCTIONS: Patient Instructions  Patient instructed to take medications as defined in the Anti-coagulation Track section of this encounter.  Patient instructed to OMT today's dose. OMIT tomorrow's dose of warfarin. On Wednesday, 16-MAY-18, resume taking warfarin by taking 1/2 tablet of your peach-colored 536mstrength warfarin tablets by mouth, once-daily [facility]. Patient verbalized understanding of these instructions.       FOLLOW-UP Return in 7 days (on 06/30/2016) for Follow up INR 0915h.  JaJorene GuestIII Pharm.D., CACP

## 2016-06-23 NOTE — Progress Notes (Signed)
INTERNAL MEDICINE TEACHING ATTENDING ADDENDUM - Nischal Narendra M.D  Duration- life long, Indication- Pafib, INR- supratherapeutic. Agree with pharmacy recommendations as outlined in their note.

## 2016-06-24 ENCOUNTER — Ambulatory Visit (HOSPITAL_COMMUNITY)
Admission: RE | Admit: 2016-06-24 | Discharge: 2016-06-24 | Disposition: A | Discharge status: Home / Self Care | Payer: PPO | Source: Ambulatory Visit | Attending: Cardiology | Admitting: Cardiology

## 2016-06-24 DIAGNOSIS — I5032 Chronic diastolic (congestive) heart failure: Secondary | ICD-10-CM

## 2016-06-24 LAB — BASIC METABOLIC PANEL
Anion gap: 7 (ref 5–15)
BUN: 19 mg/dL (ref 6–20)
CO2: 29 mmol/L (ref 22–32)
Calcium: 8.9 mg/dL (ref 8.9–10.3)
Chloride: 97 mmol/L — ABNORMAL LOW (ref 101–111)
Creatinine, Ser: 1 mg/dL (ref 0.44–1.00)
GFR calc Af Amer: >60 mL/min (ref >60)
GFR calc non Af Amer: 57 mL/min — ABNORMAL LOW (ref >60)
Glucose, Bld: 245 mg/dL — ABNORMAL HIGH (ref 65–99)
Potassium: 4 mmol/L (ref 3.5–5.1)
Sodium: 133 mmol/L — ABNORMAL LOW (ref 135–145)

## 2016-06-25 ENCOUNTER — Other Ambulatory Visit: Payer: Self-pay
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
## 2016-06-30 ENCOUNTER — Telehealth (HOSPITAL_COMMUNITY): Payer: Self-pay

## 2016-06-30 ENCOUNTER — Ambulatory Visit (INDEPENDENT_AMBULATORY_CARE_PROVIDER_SITE_OTHER): Payer: PPO | Admitting: Pharmacist

## 2016-06-30 DIAGNOSIS — I480 Paroxysmal atrial fibrillation: Secondary | ICD-10-CM

## 2016-06-30 DIAGNOSIS — Z7901 Long term (current) use of anticoagulants: Secondary | ICD-10-CM | POA: Diagnosis not present

## 2016-06-30 LAB — POCT INR: INR: 2.1

## 2016-06-30 NOTE — Telephone Encounter (Signed)
S/w Mickel Baas with [employer] verifying insurance benefits No Coinsurance or Deductible, Copay $15 Out of Pocket $3400.00, pt has met $2543.00. Reference #[id number]..... KJ

## 2016-06-30 NOTE — Patient Instructions (Signed)
Patient instructed to take medications as defined in the Anti-coagulation Track section of this encounter.  Patient instructed to take today's dose.  Patient instructed to take 1/2 tablet of your 63m peach-colored warfarin tablet by mouth, once-daily--at 6PM each day.  Patient verbalized understanding of these instructions.

## 2016-06-30 NOTE — Progress Notes (Signed)
I reviewed Dr. Gladstone Pih note.  Patient is on [facility] for Afib.  INR was 2.1.

## 2016-06-30 NOTE — Progress Notes (Signed)
Anti-Coagulation Progress Note  [name] is a 66 y.o. female who is currently on an anti-coagulation regimen for Paroxysmal atrial fibrillation (HCC) [148.0] and long term current use of anticoagulant therapy [Z79.01] seen by cardiologist Dr. Stanford Breed. Annual assessment of continued requirement based upon her CHA2DS2-VASC as well as her bleeding risk using HASBLED score and a discussion with patient for advice and consent of continued therapy or discontinuation of therapy is appropriate. At present time she continues upon anticoagulation therapy with a recently new defined target INR goal of 1.5 - 2.5 based upon a documented bleeding event.     RECENT RESULTS: Recent results are below, the most recent result is correlated with a dose of 12.5 mg. per week--reflecting 2 omitted doses last week when INR was 5.4: Lab Results  Component Value Date   INR 2.10 06/30/2016   INR 5.40 06/23/2016   INR 2.10 06/02/2016    ANTI-COAG DOSE: Anticoagulation Warfarin Dose Instructions as of 06/30/2016      [name] Tue Wed Thu Fri Sat   New Dose 2.5 mg 2.5 mg 2.5 mg 2.5 mg 2.5 mg 2.5 mg 2.5 mg       ANTICOAG SUMMARY: Anticoagulation Episode Summary    Current INR goal:   1.5-2.5  TTR:   78.1 % (3.3 y)  Next INR check:   07/28/2016  INR from last check:   2.10 (06/30/2016)  Weekly max warfarin dose:     Target end date:     INR check [facility]   [facility]  Preferred lab:     Send INR reminders to:   [facility] [place]   Indications   Paroxysmal atrial fibrillation ([place]) [I48.0] Long term current use of anticoagulant therapy [Z79.01]       Comments:         Anticoagulation Care Providers    Provider Role Specialty Phone number   Lelon Perla, MD  Cardiology [phone]      ANTICOAG TODAY: Anticoagulation Summary  As of 06/30/2016   INR goal:   1.5-2.5  TTR:     Today's INR:   2.10  Next INR check:   07/28/2016  Target end date:      Indications    Paroxysmal atrial fibrillation ([place]) [I48.0] Long term current use of anticoagulant therapy [Z79.01]        Anticoagulation Episode Summary    INR check location[facility]   [facility]   Preferred lab:      Send INR reminders to:   [facility] [address]   Comments:       Anticoagulation Care Providers    Provider Role Specialty Phone number   Lelon Perla, MD  Cardiology [phone]      PATIENT INSTRUCTIONS: Patient Instructions  Patient instructed to take medications as defined in the Anti-coagulation Track section of this encounter.  Patient instructed to take today's dose.  Patient instructed to take 1/2 tablet of your 46m peach-colored warfarin tablet by mouth, once-daily--at 6PM each day.  Patient verbalized understanding of these instructions.        FOLLOW-UP Return in 4 weeks (on 07/28/2016) for Follow up INR at 0915h.  JJorene Guest III Pharm.D., CACP

## 2016-07-03 ENCOUNTER — Telehealth (HOSPITAL_COMMUNITY): Payer: Self-pay

## 2016-07-03 NOTE — Telephone Encounter (Signed)
07/02/16 - late entry - I called and spoke to patient about scheduling for orientation and Cardiac Rehab. Patient declined due to financial reasons. Patient has $15 co-pay and out of pocket has not been met. Patient asked if she could participate in Pulmonary Rehab because she has done this program before. Patient asked could I forward the information to pulmonary rehab to contact her. I have left pulmonary rehab a message to contact patient. Referral canceled.  ° °

## 2016-07-04 ENCOUNTER — Telehealth: Payer: Self-pay

## 2016-07-04 ENCOUNTER — Other Ambulatory Visit: Payer: Self-pay | Admitting: *Deleted

## 2016-07-04 NOTE — Telephone Encounter (Signed)
Pt calls and states she takes 4 to 6 pain pills daily and she has found 90 a month is not enough, she states she thinks she can "get by" with 120 per month. She would like for you to reconsider this. appt made 6/7

## 2016-07-04 NOTE — Telephone Encounter (Signed)
Asking to speak with [name]. Please call back.  

## 2016-07-08 ENCOUNTER — Encounter (HOSPITAL_COMMUNITY): Payer: Self-pay

## 2016-07-08 ENCOUNTER — Ambulatory Visit (HOSPITAL_COMMUNITY)
Admission: RE | Admit: 2016-07-08 | Discharge: 2016-07-08 | Disposition: A | Discharge status: Home / Self Care | Payer: PPO | Source: Ambulatory Visit | Attending: Cardiology | Admitting: Cardiology

## 2016-07-08 VITALS — BP 122/58 | HR 58 | Wt 187.8 lb

## 2016-07-08 DIAGNOSIS — I480 Paroxysmal atrial fibrillation: Secondary | ICD-10-CM | POA: Insufficient documentation

## 2016-07-08 DIAGNOSIS — Z794 Long term (current) use of insulin: Secondary | ICD-10-CM | POA: Insufficient documentation

## 2016-07-08 DIAGNOSIS — G4733 Obstructive sleep apnea (adult) (pediatric): Secondary | ICD-10-CM | POA: Insufficient documentation

## 2016-07-08 DIAGNOSIS — Z953 Presence of xenogenic heart valve: Secondary | ICD-10-CM | POA: Insufficient documentation

## 2016-07-08 DIAGNOSIS — F17210 Nicotine dependence, cigarettes, uncomplicated: Secondary | ICD-10-CM | POA: Diagnosis not present

## 2016-07-08 DIAGNOSIS — E119 Type 2 diabetes mellitus without complications: Secondary | ICD-10-CM | POA: Diagnosis not present

## 2016-07-08 DIAGNOSIS — Z8249 Family history of ischemic heart disease and other diseases of the circulatory system: Secondary | ICD-10-CM | POA: Insufficient documentation

## 2016-07-08 DIAGNOSIS — I272 Pulmonary hypertension, unspecified: Secondary | ICD-10-CM | POA: Diagnosis not present

## 2016-07-08 DIAGNOSIS — I2723 Pulmonary hypertension due to lung diseases and hypoxia: Secondary | ICD-10-CM

## 2016-07-08 DIAGNOSIS — Z79899 Other long term (current) drug therapy: Secondary | ICD-10-CM | POA: Diagnosis not present

## 2016-07-08 DIAGNOSIS — Z7951 Long term (current) use of inhaled steroids: Secondary | ICD-10-CM | POA: Insufficient documentation

## 2016-07-08 DIAGNOSIS — J449 Chronic obstructive pulmonary disease, unspecified: Secondary | ICD-10-CM | POA: Insufficient documentation

## 2016-07-08 DIAGNOSIS — I2510 Atherosclerotic heart disease of native coronary artery without angina pectoris: Secondary | ICD-10-CM | POA: Insufficient documentation

## 2016-07-08 DIAGNOSIS — Z7901 Long term (current) use of anticoagulants: Secondary | ICD-10-CM | POA: Insufficient documentation

## 2016-07-08 DIAGNOSIS — I5032 Chronic diastolic (congestive) heart failure: Secondary | ICD-10-CM | POA: Diagnosis not present

## 2016-07-08 DIAGNOSIS — E785 Hyperlipidemia, unspecified: Secondary | ICD-10-CM | POA: Diagnosis not present

## 2016-07-08 LAB — BASIC METABOLIC PANEL
Anion gap: 10 (ref 5–15)
BUN: 15 mg/dL (ref 6–20)
CO2: 27 mmol/L (ref 22–32)
Calcium: 9.1 mg/dL (ref 8.9–10.3)
Chloride: 101 mmol/L (ref 101–111)
Creatinine, Ser: 1 mg/dL (ref 0.44–1.00)
GFR calc Af Amer: >60 mL/min (ref >60)
GFR calc non Af Amer: 57 mL/min — ABNORMAL LOW (ref >60)
Glucose, Bld: 210 mg/dL — ABNORMAL HIGH (ref 65–99)
Potassium: 4 mmol/L (ref 3.5–5.1)
Sodium: 138 mmol/L (ref 135–145)

## 2016-07-08 NOTE — Telephone Encounter (Signed)
Sounds great.  Thanks for scheduling the appointment.  I will definitely need to reassess her pain and review the risks and benefits of increasing the opiate medication.  If this is appropriate it may require a concomitant reduction in the benzodiazepine.

## 2016-07-08 NOTE — Patient Instructions (Signed)
Labs today We will only contact you if something comes back abnormal or we need to make some changes. Otherwise no news is good news!   Your physician recommends that you schedule a follow-up appointment in: 6-8 weeks with Dr Aundra Dubin   Do the following things EVERYDAY: 1) Weigh yourself in the morning before breakfast. Write it down and keep it in a log. 2) Take your medicines as prescribed 3) Eat low salt foods-Limit salt (sodium) to 2000 mg per day.  4) Stay as active as you can everyday 5) Limit all fluids for the day to less than 2 liters

## 2016-07-08 NOTE — Progress Notes (Signed)
Advanced Heart Failure Clinic Note   PCP: Dr. Eppie Gibson HF Cardiology: Dr. Aundra Dubin  66 yo with COPD, mitral stenosis s/p bioprosthetic MV replacement, paroxysmal atrial fibrillation, GI bleeding, and pulmonary hypertension/RV failure presents for cardiology followup.  She was admitted with GI bleeding, thought to be AVMs, in 2/18 and warfarin was stopped.  She was admitted in 3/18 with chest pain, dyspnea, and elevated troponin.  She had not been taking her Lasix regularly.  Coronary angiography showed nonobstructive CAD, suspect demand ischemia. RHC showed elevated right and left heart filling pressures and severe pulmonary hypertension.  V/Q scan showed no chronic PE, and PFTs showed severe obstructive lung disease.  Echo showed severe RV dilation with mildly decreased systolic function.  She was diuresed and eventually discharged to [facility].   She presents today for regular follow up. At last visit lasix increased. Weight at home 187-190. Feels like lasix increase helped her breathing.  Does not use home oxygen continuously, but only using at bedtime and with leaving the house. Uses CPAP every time she sleeps (including naps). Occasionally SOB walking around the house. No real SOB changing clothes or bathing.  She continues to smoke 1 ppd.  Dr Eppie Gibson office following coumadin level. No bleeding even with recent supre-therapeutic levels. Fell in [employer] parking lot recently. No injury.   Labs (3/18): K 4.8 => 3.9, creatinine 1.2 => 1.09, hgb 10.2 => 10.7, BNP 131  PMH: 1. Mitral stenosis: s/p bioprosthetic mitral valve replacement.  2. COPD: Severe by 3/18 PFTs.  She is still smoking.  3. Atrial fibrillation: Paroxysmal. Has been off warfarin since GI bleed in 2/18.  4. OSA: Uses CPAP.  5. Type II diabetes. 6. Pulmonary hypertension/RV failure: Suspect primarily group 3 PH due to underlying lung disease (COPD).  - RHC (3/18) with mean RA 14, PA 77/28 mean 47, mean PCWP 23, CI 2.6, PVR  4.9.  - Echo (3/18): EF 60-65%, severe RV dilation with mildly decreased RV systolic function, PASP 76 mmHg.  Bioprosthetic MV looks ok.  - V/Q scan (3/18): No evidence for chronic PE.  - PFTs (3/18): Severe obstructive airways disease.  7. Anemia: Fe deficiency and anemia of chronic disease.   - Suspect GI AVMs as cause of 2/18 bleed . Coumadin stopped.  8. CAD: LHC (3/18) with 30% LM, 40% proximal RCA.  9. Hyperlipidemia  SH: Lives alone in [place].  Active smoker.  No ETOH.   Family History  Problem Relation Age of Onset  . Peptic Ulcer Disease Father   . Heart attack Father 75       Died of MI at age 69  . Heart attack Brother 92       Died of MI at age 49  . Obesity Brother   . Pneumonia Mother   . Healthy Sister   . Lupus Daughter   . Obsessive Compulsive Disorder Daughter    ROS: All systems reviewed and negative except as per HPI.   Current Outpatient Prescriptions  Medication Sig Dispense Refill  . albuterol (PROVENTIL HFA;VENTOLIN HFA) 108 (90 Base) MCG/ACT inhaler Inhale 2 puffs into the lungs every 6 (six) hours as needed for wheezing or shortness of breath.    . Blood Glucose Monitoring Suppl (ONETOUCH VERIO) W/DEVICE KIT 1 each by Does not apply route 4 (four) times daily. 1 kit 0  . chlorpheniramine (CHLOR-TRIMETON) 4 MG tablet Take 1 tablet (4 mg total) by mouth 3 (three) times daily as needed for allergies. 90 tablet 11  .  cyclobenzaprine (FLEXERIL) 5 MG tablet Take 1 tablet (5 mg total) by mouth 3 (three) times daily as needed for muscle spasms. 90 tablet 3  . EASY TOUCH PEN NEEDLES 31G X 5 MM MISC USE TO INJECT INSULIN 5 TIMES DAILY 200 each 8  . ferrous sulfate 325 (65 FE) MG tablet Take 325 mg by mouth 2 (two) times daily with a meal.    . fluconazole (DIFLUCAN) 150 MG tablet Take 1 tablet (150 mg total) by mouth once as needed. 2 tablet 3  . FLUoxetine (PROZAC) 20 MG tablet Take 2 tablets (40 mg total) by mouth daily. 180 tablet 3  . fluticasone (FLONASE)  50 MCG/ACT nasal spray Place 2 sprays into both nostrils daily. 16 g 3  . Fluticasone-Salmeterol (ADVAIR DISKUS) 500-50 MCG/DOSE AEPB Inhale 1 puff into the lungs 2 (two) times daily. 180 each 3  . furosemide (LASIX) 80 MG tablet Take 80 mg (1 Tablet) in the AM, and 40 mg (0.5 Tablet) in the PM. 135 tablet 3  . gabapentin (NEURONTIN) 300 MG capsule Take 2 capsules (600 mg total) by mouth 3 (three) times daily. 540 capsule 3  . HYDROcodone-acetaminophen (NORCO/VICODIN) 5-325 MG tablet Take 1-2 tablets by mouth every 6 (six) hours as needed for severe pain. 90 tablet 0  . insulin aspart (NOVOLOG) 100 UNIT/ML FlexPen Inject 18 Units into the skin 3 (three) times daily with meals. 18 mL 10  . Insulin Detemir (LEVEMIR) 100 UNIT/ML Pen USE 30 UNITS IN THE MORNING AND 25 UNITS IN THE EVENING 18 mL 11  . Insulin Syringe-Needle U-100 31G X 15/64" 0.5 ML MISC Use to inject insulin up to 4 times a day 400 each 3  . Lancets Misc. (ACCU-CHEK FASTCLIX LANCET) KIT Check your blood 4 times a day dx code 250.00 insulin requiring 1 kit 2  . metoprolol succinate (TOPROL-XL) 25 MG 24 hr tablet Take 1 tablet (25 mg total) by mouth daily. 30 tablet 3  . omeprazole (PRILOSEC) 40 MG capsule Take 1 capsule (40 mg total) by mouth 2 (two) times daily. 180 capsule 3  . ONETOUCH VERIO test strip Check blood sugar up to 4 times a day 350 each 11  . OXYGEN Inhale 2 L into the lungs continuous.    . potassium chloride SA (K-DUR,KLOR-CON) 20 MEQ tablet Take 1 tablet (20 mEq total) by mouth 2 (two) times daily. 60 tablet 3  . promethazine (PHENERGAN) 12.5 MG tablet Take 1 tablet (12.5 mg total) by mouth every 8 (eight) hours as needed for nausea or vomiting. 30 tablet 0  . rosuvastatin (CRESTOR) 20 MG tablet TAKE 1 TABLET BY MOUTH EVERY NIGHT AT BEDTIME 90 tablet 2  . SPIRIVA HANDIHALER 18 MCG inhalation capsule INHALE THE CONTENTS OF 1 CAPSULE VIA HANDIHALER BY MOUTH EVERY DAY 90 capsule 2  . warfarin (COUMADIN) 2.5 MG tablet TAKE  1/2 TABLET (2.5 MG) DAILY EXCEPT ON MONDAYS TAKE 1 TABLET (5 MG) 20 tablet 1   No current facility-administered medications for this encounter.    BP (!) 122/58   Pulse (!) 58   Wt 187 lb 12.8 oz (85.2 kg)   SpO2 92%   BMI 34.35 kg/m    Wt Readings from Last 3 Encounters:  07/08/16 187 lb 12.8 oz (85.2 kg)  06/13/16 189 lb (85.7 kg)  06/02/16 189 lb 11.2 oz (86 kg)    General: Obese, NAD.  HEENT: Normal Neck: supple. JVP 7-8 cm. Carotids 2+ bilat; no bruits. No thyromegaly or nodule noted.  Cor: PMI nondisplaced. RRR, No M/G/R noted Lungs: Diminished throughout with scant crackles. Prolonged expiratory phase.  Abdomen: soft, non-tender, distended, no HSM. No bruits or masses. +BS  Extremities: no cyanosis, clubbing, or rash. Trace ankle edema.  Neuro: alert & orientedx3, cranial nerves grossly intact. moves all 4 extremities w/o difficulty. Affect pleasant    Assessment/Plan: 1. Pulmonary hypertension: Suspect this is primarily group 2 PH (pulmonary venous hypertension from LV diastolic dysfunction) and group 3 PH (severe COPD, OSA).  V/Q scan showed no evidence for chronic PE.  Do not think she would benefit from selective pulmonary vasodilators.  - Will continue to focus on optimizing diuresis, 02 use as directed, and CPAP.  - Encouraged to stop smoking.  2. Chronic diastolic CHF with prominent RV dysfunction in the setting of severe COPD.  She is volume overloaded on exam with NYHA class IIIb symptoms (dyspnea is likely a combination of CHF and COPD).  - Volume status stable on lasix 80 mg q am and 40 mg q pm.  - Continue KCl 20 meq BID. BMET today.  3. Atrial fibrillation: Paroxysmal.   - NSR by exam. No bleeding on coumadin. Goal INR 1.5 - 2.5 4. h/o GI bleeding: Possible GI AVM.  - No further bleeding.  INR goal as above.  5. COPD: Severe by PFTs.  - She continues to smoke and is not willing to consider cessation. Will continue to encourage cessation at every opportunity.   6. CAD: Nonobstructive.  - Continue statin. No CP.   Stable. Follow up 6-8 weeks with Dr. Leo Rod, PA-C  07/08/2016  Advanced Heart Failure Team Pager [phone] (M-F; 7a - 4p)  Please contact [facility] for night-coverage after hours (4p -7a ) and weekends on [email or web address]

## 2016-07-10 ENCOUNTER — Telehealth: Payer: Self-pay | Admitting: *Deleted

## 2016-07-10 NOTE — Telephone Encounter (Signed)
PPA is open again and are resuming medication refills for pt, they call to clarify last warfarin script, read latest note from 5/21, pt is to take 2.33m daily and return to coumadin clinic 6/18 . Pharmacist ask triage if they can give pt 2.596mtablets take 1 daily by mouth #20 no refills, VO given, do you agree?

## 2016-07-11 NOTE — Telephone Encounter (Signed)
Yes.  That is the correct dose that she is currently to take.  Thank You.

## 2016-07-14 ENCOUNTER — Other Ambulatory Visit: Payer: Self-pay
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
## 2016-07-17 ENCOUNTER — Other Ambulatory Visit: Payer: Self-pay | Admitting: Internal Medicine

## 2016-07-17 ENCOUNTER — Ambulatory Visit (INDEPENDENT_AMBULATORY_CARE_PROVIDER_SITE_OTHER): Payer: PPO | Admitting: Internal Medicine

## 2016-07-17 ENCOUNTER — Encounter: Payer: Self-pay | Admitting: Internal Medicine

## 2016-07-17 VITALS — BP 142/49 | HR 59 | Temp 97.9°F | Wt 190.3 lb

## 2016-07-17 DIAGNOSIS — I10 Essential (primary) hypertension: Secondary | ICD-10-CM

## 2016-07-17 DIAGNOSIS — R32 Unspecified urinary incontinence: Secondary | ICD-10-CM

## 2016-07-17 DIAGNOSIS — I480 Paroxysmal atrial fibrillation: Secondary | ICD-10-CM

## 2016-07-17 DIAGNOSIS — E1140 Type 2 diabetes mellitus with diabetic neuropathy, unspecified: Secondary | ICD-10-CM

## 2016-07-17 DIAGNOSIS — B373 Candidiasis of vulva and vagina: Secondary | ICD-10-CM | POA: Diagnosis not present

## 2016-07-17 DIAGNOSIS — Z79891 Long term (current) use of opiate analgesic: Secondary | ICD-10-CM

## 2016-07-17 DIAGNOSIS — M545 Low back pain, unspecified: Secondary | ICD-10-CM

## 2016-07-17 DIAGNOSIS — D638 Anemia in other chronic diseases classified elsewhere: Secondary | ICD-10-CM | POA: Diagnosis not present

## 2016-07-17 DIAGNOSIS — G8929 Other chronic pain: Secondary | ICD-10-CM

## 2016-07-17 DIAGNOSIS — Z794 Long term (current) use of insulin: Secondary | ICD-10-CM | POA: Diagnosis not present

## 2016-07-17 DIAGNOSIS — F17210 Nicotine dependence, cigarettes, uncomplicated: Secondary | ICD-10-CM

## 2016-07-17 DIAGNOSIS — Q2730 Arteriovenous malformation, site unspecified: Secondary | ICD-10-CM

## 2016-07-17 DIAGNOSIS — D369 Benign neoplasm, unspecified site: Secondary | ICD-10-CM

## 2016-07-17 DIAGNOSIS — Z720 Tobacco use: Secondary | ICD-10-CM

## 2016-07-17 DIAGNOSIS — Z7901 Long term (current) use of anticoagulants: Secondary | ICD-10-CM

## 2016-07-17 DIAGNOSIS — K219 Gastro-esophageal reflux disease without esophagitis: Secondary | ICD-10-CM

## 2016-07-17 DIAGNOSIS — B3731 Acute candidiasis of vulva and vagina: Secondary | ICD-10-CM

## 2016-07-17 DIAGNOSIS — F329 Major depressive disorder, single episode, unspecified: Secondary | ICD-10-CM | POA: Diagnosis not present

## 2016-07-17 DIAGNOSIS — K449 Diaphragmatic hernia without obstruction or gangrene: Secondary | ICD-10-CM

## 2016-07-17 DIAGNOSIS — F419 Anxiety disorder, unspecified: Secondary | ICD-10-CM

## 2016-07-17 DIAGNOSIS — Z86010 Personal history of colonic polyps: Secondary | ICD-10-CM

## 2016-07-17 LAB — POCT GLYCOSYLATED HEMOGLOBIN (HGB A1C): Hemoglobin A1C: 8.2

## 2016-07-17 LAB — GLUCOSE, CAPILLARY: Glucose-Capillary: 262 mg/dL — ABNORMAL HIGH (ref 65–99)

## 2016-07-17 MED ORDER — HYDROCODONE-ACETAMINOPHEN 5-325 MG PO TABS: 1.0000 | ORAL_TABLET | Freq: Four times a day (QID) | ORAL | 0 refills | Status: DC | PRN

## 2016-07-17 MED ORDER — INSULIN DETEMIR 100 UNIT/ML FLEXPEN: PEN_INJECTOR | SUBCUTANEOUS | 11 refills | Status: DC
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
## 2016-07-17 NOTE — Assessment & Plan Note (Signed)
Assessment  Her diabetes is more poorly controlled today with a hemoglobin of 8.2 up from 6.4 three months ago. Some of this is related to dietary indiscretion and resultant 3 pound increase in weight. She states she's been compliant with her insulin which includes Levemir 30 units in the morning and 25 units in the evening as well as NovoLog 18 units 3 times daily with meals. The hemoglobin A1c is consistent with the results from her glucose meter download which I personally reviewed. She had 43 readings and only 2% were within target. Her average glucose on these readings was 217.  Plan  We discussed the importance of watching her diet and continuing to consistently use her insulin as prescribed. We have increased her Levemir to 30 units by mouth twice daily and continue the NovoLog at 18 units 3 times daily with meals. We will reassess her glycemic control at the follow-up visit with a repeat hemoglobin A1c.

## 2016-07-17 NOTE — Assessment & Plan Note (Signed)
Assessment  She has a history of adenomatous polyps, the last being excised endoscopically in 2013. Given her multiple significant chronic medical conditions an invasive procedure such as colonoscopy isn't without risk. In addition, if she was found to have a colon cancer it is unlikely she would be a viable surgical candidate given the severity of her cardiopulmonary disease. She realizes this and is not interested in further colonoscopic surveillance.  Plan  She has decided to discontinue any further surveillance and this will therefore not be pursued.
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
## 2016-07-17 NOTE — Assessment & Plan Note (Signed)
Assessment  We spent another 3 minutes discussing the importance of smoking cessation. She remains in the pre-contemplative stage at this point.  Plan  We will reassess her interest in smoking cessation at the follow-up visit and offer pharmacologic intervention if she moves beyond the pre-contemplative stage.

## 2016-07-17 NOTE — Progress Notes (Signed)
   Subjective:    Patient ID: [name], female    DOB: 1950-02-20, 66 y.o.   MRN: [id number]  HPI  [name] is here for follow-up of her chronic low back pain, diabetes, hypertension, tobacco abuse, and urinary incontinence. Please see the A&P for the status of the pt's chronic medical problems.  Review of Systems  Constitutional: Positive for fatigue. Negative for activity change, appetite change and unexpected weight change.  Respiratory: Positive for shortness of breath. Negative for chest tightness.   Cardiovascular: Negative for chest pain, palpitations and leg swelling.  Genitourinary: Positive for genital sores.  Musculoskeletal: Positive for arthralgias, back pain and gait problem.  Skin: Positive for color change and rash.  Psychiatric/Behavioral: Positive for decreased concentration and dysphoric mood. The patient is nervous/anxious.       Objective:   Physical Exam  Constitutional: She is oriented to person, place, and time. She appears well-developed and well-nourished. No distress.  HENT:  Head: Normocephalic and atraumatic.  Eyes: Conjunctivae are normal. Right eye exhibits no discharge. Left eye exhibits no discharge. No scleral icterus.  Genitourinary:    No labial fusion. There is rash and tenderness on the right labia. There is no lesion or injury on the right labia. There is rash and tenderness on the left labia. There is no lesion or injury on the left labia.  Musculoskeletal: Normal range of motion. She exhibits no edema, tenderness or deformity.  Neurological: She is alert and oriented to person, place, and time. She exhibits normal muscle tone.  Skin: Skin is warm and dry. No rash noted. She is not diaphoretic. No erythema.  Psychiatric: She has a normal mood and affect. Her behavior is normal. Judgment and thought content normal.  Nursing note and vitals reviewed.     Assessment & Plan:   Please see problem oriented charting.

## 2016-07-17 NOTE — Assessment & Plan Note (Signed)
Assessment  She has anemia of chronic disease, but has also had AVMs that have resulted in bleeding. She has no overt bleeding at this time.  Plan  A CBC was obtained at this visit and is pending at the time of this dictation.

## 2016-07-17 NOTE — Patient Instructions (Addendum)
It was good to see you today.  1) I adjusted your hydrocodone to #120/month.  When I refill your xanax next I will decrease to #60/month.  I gave you three copies of the hydrocodone which will get you into September.  2) Increase your Levimir to 30 units twice daily.  3) Take Fluconazole 150 mg once to help with the yeast infection.  Keep using the nystatin.  4) Keep practicing the Kegel exercises.  5) Lock your medications up as soon as you get a safe.  6) Working on learning more about gun safety.  Have a low threshold to call the police if you or your daughter are worried.  7) I will call you with the results of your labs.  I will see you back in 3 months, sooner if necessary.

## 2016-07-17 NOTE — Assessment & Plan Note (Signed)
Assessment  She has recurrence of her candidiasis within her vulvar and anal area as well as under her abdominal pannus. This is likely associated with the worsening control of her diabetes, the fact that she needs to wear pull-up undergarments for urge and stress incontinence, and being on warfarin which has precluded as needed fluconazole which was effective in controlling her symptoms in the past.  Plan  We have made changes to her diabetic regimen in hopes of improving her glycemic control. She continues to work on Kegel exercises but likely will continue to require pull-ups and this is therefore unlikely to be a modifiable risk factor. Finally, we have decided to prescribe fluconazole 150 mg once. She is to hold the warfarin on the day she takes the fluconazole and then restart the warfarin the following day. We will reassess the efficacy of these changes in controlling her candidiasis at the follow-up visit.

## 2016-07-18 LAB — CBC WITH DIFFERENTIAL/PLATELET
Basophils Absolute: 0 10*3/uL (ref 0.0–0.2)
Basos: 0 %
EOS (ABSOLUTE): 0.1 10*3/uL (ref 0.0–0.4)
Eos: 2 %
Hematocrit: 37.2 % (ref 34.0–46.6)
Hemoglobin: 11.5 g/dL (ref 11.1–15.9)
Immature Grans (Abs): 0 10*3/uL (ref 0.0–0.1)
Immature Granulocytes: 0 %
Lymphocytes Absolute: 1.2 10*3/uL (ref 0.7–3.1)
Lymphs: 18 %
MCH: 28.2 pg (ref 26.6–33.0)
MCHC: 30.9 g/dL — ABNORMAL LOW (ref 31.5–35.7)
MCV: 91 fL (ref 79–97)
Monocytes Absolute: 0.6 10*3/uL (ref 0.1–0.9)
Monocytes: 9 %
Neutrophils Absolute: 4.8 10*3/uL (ref 1.4–7.0)
Neutrophils: 71 %
Platelets: 205 10*3/uL (ref 150–379)
RBC: 4.08 x10E6/uL (ref 3.77–5.28)
RDW: 15.8 % — ABNORMAL HIGH (ref 12.3–15.4)
WBC: 6.7 10*3/uL (ref 3.4–10.8)

## 2016-07-18 LAB — MAGNESIUM: Magnesium: 2 mg/dL (ref 1.6–2.3)

## 2016-07-18 NOTE — Progress Notes (Signed)
Patient ID: [name], female   DOB: 1950/12/08, 66 y.o.   MRN: [id number]  CBC Hgb 11.5, Hct 37.5  No evidence of bleeding despite the warfarin anticoagulation  Mg 2.0  Magnesium level has been repleted.  Urine microalbumin and UDS Pending.  I called [name] with the available results.  She has decided to hold the warfarin for 2 days after taking a fluconazole tablet.  I told her I am OK with this plan.

## 2016-07-18 NOTE — Telephone Encounter (Signed)
Xanax phoned into pharmacy.Despina Hidden Cassady6/8/20188:47 AM

## 2016-07-21 NOTE — Progress Notes (Signed)
Patient ID: [name], female   DOB: 11/07/1950, 66 y.o.   MRN: [id number]  Urine creatinine 154.1 Urine albumin 40.8 Microalbumin/Creatine Ratio 26.5  Still without evidence of persistent microalbuminuria associated with diabetes.  If BP remains elevated will consider the addition of ACEI regardless.

## 2016-07-22 ENCOUNTER — Other Ambulatory Visit: Payer: Self-pay

## 2016-07-22 LAB — MICROALBUMIN / CREATININE URINE RATIO
Creatinine, Urine: 154.1 mg/dL
Microalb/Creat Ratio: 26.5 mg/g creat (ref 0.0–30.0)
Microalbumin, Urine: 40.8 ug/mL

## 2016-07-22 LAB — TOXASSURE SELECT,+ANTIDEPR,UR

## 2016-07-22 NOTE — Progress Notes (Signed)
Patient ID: [name], female   DOB: 06/25/1950, 66 y.o.   MRN: [id number]  UDS: Appropriate

## 2016-07-22 NOTE — Patient Outreach (Signed)
[facility]) Care Management  07/22/2016  [name] [birth year] [id number]   This RNCM arrived at patient's home for scheduled home visit. Patient did not come to the door after this RNCM ranged the doorbell several times.   Plan: Discharge patient from caseload as she has previously met her case management goals as evidenced from documentation from previous contacts.

## 2016-07-28 ENCOUNTER — Ambulatory Visit: Payer: PPO

## 2016-07-29 ENCOUNTER — Telehealth: Payer: Self-pay | Admitting: Dietician

## 2016-07-29 DIAGNOSIS — F329 Major depressive disorder, single episode, unspecified: Secondary | ICD-10-CM

## 2016-07-29 NOTE — Telephone Encounter (Signed)
She would like better blood sugars: It was 222 this am, 167- 2am ate and took 10 units Monday: 3 am 165/740 am 158/3pm 213/  Sunday: 230 am 275/ 830 am 198/ 12 pm- 183/615 pm 250  Has been taking more novolog-87 units yesterday, 30 units levemir twice a day for past 2 weeks. Feels like she is taking too much novolog. wants to increase levemir. Suggested no more than 10% increase (~14-15 units). She plans to take 37 units levemir twice a day starting today. (74 total)   sleepy/lonely and depressed- can't come up with a solution. Feels like she is gaining weight. Supported as much as possible. Told her I'd route this note to Dr. Eppie Gibson and call her back as needed.

## 2016-07-30 MED ORDER — FLUOXETINE HCL 60 MG PO TABS: 60.0000 mg | ORAL_TABLET | Freq: Every day | ORAL | 11 refills | Status: DC

## 2016-07-30 NOTE — Addendum Note (Signed)
Addended by: [name] D on: 07/30/2016 04:02 PM   Modules accepted: Orders

## 2016-07-30 NOTE — Telephone Encounter (Signed)
I returned the call and we decided to increase the prozac to 60 mg daily.  I rewrote the prescription to reflect this change and she knows when she receives the next prozac prescription that she should only take one tablet.  She noted an episode of BRBPR which was self limited.  It is likely her hemorrhoids.  Even with the history of an adenomatous polyp she is not a good candidate for invasive evaluation with colonoscopy and is not interested in having this done.  She will call if the BRBPR returns.  Lastly, she had a "fluttering in her chest" this resolved and her pulse while on the phone was in the 70's per her pulse ox.  She has a h/o paroxysmal atrial fibrillation and this may have been a bout.  She will check her pulse ox if she gets this sensation again and if the pulse is greater than 100 she was asked to call the clinic so we could get her in for an ECG and further evaluation.

## 2016-08-04 ENCOUNTER — Ambulatory Visit (INDEPENDENT_AMBULATORY_CARE_PROVIDER_SITE_OTHER): Payer: PPO | Admitting: Pharmacist

## 2016-08-04 DIAGNOSIS — I480 Paroxysmal atrial fibrillation: Secondary | ICD-10-CM

## 2016-08-04 DIAGNOSIS — Z7901 Long term (current) use of anticoagulants: Secondary | ICD-10-CM

## 2016-08-04 LAB — POCT INR: INR: 2.5

## 2016-08-04 NOTE — Progress Notes (Signed)
Anticoagulation Management [name] is a 66 y.o. female who reports to the clinic for monitoring of warfarin treatment.    Indication: atrial fibrillation  Duration: indefinite Supervising physician: [facility] Visit History: Patient does not report signs/symptoms of bleeding or thromboembolism  Other recent changes: No diet, medications, lifestyle changes endorsed.  Anticoagulation Episode Summary    Current INR goal:   1.5-2.5  TTR:   78.1 % (3.3 y)  Next INR check:   07/28/2016  INR from last check:   2.10 (06/30/2016)  Weekly max warfarin dose:     Target end date:     INR check location:   [facility]  Preferred lab:     Send INR reminders to:   [facility] [place]   Indications   Paroxysmal atrial fibrillation ([place]) [I48.0] Long term current use of anticoagulant therapy [Z79.01]       Comments:         Anticoagulation Care Providers    Provider Role Specialty Phone number   Lelon Perla, MD  Cardiology [phone]     ASSESSMENT Recent Results: The most recent result is correlated with 17.5 mg per week: Lab Results  Component Value Date   INR 2.10 06/30/2016   INR 5.40 06/23/2016   INR 2.10 06/02/2016    Anticoagulation Dosing: INR as of 06/30/2016 and Previous Warfarin Dosing Information    INR Dt INR Goal [name] Sun Mon Tue Wed Thu Fri Sat   06/30/2016 2.10 1.5-2.5 12.5 mg 2.5 mg 0 mg 0 mg 2.5 mg 2.5 mg 2.5 mg 2.5 mg    Previous description   OMIT today and tomorrow's doses of warfarin. On Wednesday 16-MAY-18, resume warfarin by taking 1/2 tablet of your peach-colored 27m strength warfarin tablet by mouth daily at 6PM.   Anticoagulation Warfarin Dose Instructions as of 06/30/2016      Total Sun Mon Tue Wed Thu Fri Sat   New Dose 17.5 mg 2.5 mg 2.5 mg 2.5 mg 2.5 mg 2.5 mg 2.5 mg 2.5 mg     (5 mg x 0.5)  (5 mg x 0.5)  (5 mg x 0.5)  (5 mg x 0.5)  (5 mg x 0.5)  (5 mg x 0.5)  (5 mg x 0.5)                            INR today: Therapeutic  PLAN Weekly dose was decreased by 12% to 15 mg per week   Patient advised to contact clinic or seek medical attention if signs/symptoms of bleeding or thromboembolism occur.  Patient verbalized understanding by repeating back information and was advised to contact me if further medication-related questions arise. Patient was also provided an information handout.  Follow-up Return in 5 weeks (on 09/08/2016) for Follow up INR at 1030h.  GCaryl Bis 15 minutes spent face-to-face with the patient during the encounter. 50% of time spent on education. 50% of time was spent on finger-stick, point of care sample collection, processing, interpretation and data-entry.

## 2016-08-04 NOTE — Patient Instructions (Addendum)
Patient instructed to take medications as defined in the Anti-coagulation Track section of this encounter.  Patient instructed to OMIT doses on all Monday. Do not take today's dose.  Patient instructed to take ONE-HALF (1/2) tablet of her CURRENT STRENGTH 41m peach-colored warfarin tablets, by mouth, one-daily at 6PM on all days of week EXCEPT MONDAYS. DO NOT TAKE ANY warfarin on Mondays.  Patient instructed to call me at [phone] her current bottle of 539mstrength warfarin tablets are expended and she is ready to transition to her 2.64m43mtrength green warfarin tablets and I will make certain she is taking the correct number of tablets.  Patient verbalized understanding of these instructions.

## 2016-08-05 ENCOUNTER — Telehealth: Payer: Self-pay | Admitting: *Deleted

## 2016-08-05 DIAGNOSIS — E1140 Type 2 diabetes mellitus with diabetic neuropathy, unspecified: Secondary | ICD-10-CM

## 2016-08-05 DIAGNOSIS — Z794 Long term (current) use of insulin: Principal | ICD-10-CM

## 2016-08-05 NOTE — Telephone Encounter (Signed)
Received refill request from pt's pharmacy for levemir flextouch inject 30 units in the morning and inject 25 units in the evening.  Per pharmacy, "pt reports the dose of this medication has changed.  If appropriate, please send new rx.".  Will send to pcp for review.  Please advise.Despina Hidden Cassady6/26/20184:49 PM

## 2016-08-05 NOTE — Progress Notes (Signed)
I reviewed Dr. Gladstone Pih note and plan.  Patient is on coumadin for Afib.

## 2016-08-06 MED ORDER — INSULIN DETEMIR 100 UNIT/ML FLEXPEN: PEN_INJECTOR | SUBCUTANEOUS | 11 refills | Status: DC

## 2016-08-12 ENCOUNTER — Other Ambulatory Visit: Payer: Self-pay | Admitting: Internal Medicine

## 2016-08-12 DIAGNOSIS — J439 Emphysema, unspecified: Secondary | ICD-10-CM

## 2016-08-12 DIAGNOSIS — E785 Hyperlipidemia, unspecified: Secondary | ICD-10-CM

## 2016-08-12 DIAGNOSIS — F419 Anxiety disorder, unspecified: Secondary | ICD-10-CM

## 2016-08-12 DIAGNOSIS — I5032 Chronic diastolic (congestive) heart failure: Secondary | ICD-10-CM

## 2016-08-13 ENCOUNTER — Other Ambulatory Visit: Payer: Self-pay | Admitting: Internal Medicine

## 2016-08-19 ENCOUNTER — Telehealth: Payer: Self-pay

## 2016-08-19 NOTE — Telephone Encounter (Signed)
Needs to speak with a nurse about throat problem. Please call pt back.

## 2016-08-20 MED ORDER — POTASSIUM CHLORIDE CRYS ER 20 MEQ PO TBCR: 20.0000 meq | EXTENDED_RELEASE_TABLET | Freq: Two times a day (BID) | ORAL | 3 refills | Status: DC

## 2016-08-25 ENCOUNTER — Ambulatory Visit (HOSPITAL_COMMUNITY)
Admission: RE | Admit: 2016-08-25 | Discharge: 2016-08-25 | Disposition: A | Discharge status: Home / Self Care | Payer: PPO | Source: Ambulatory Visit | Attending: Cardiology | Admitting: Cardiology

## 2016-08-25 ENCOUNTER — Encounter (HOSPITAL_COMMUNITY): Payer: Self-pay | Admitting: Cardiology

## 2016-08-25 VITALS — BP 128/54 | HR 52 | Wt 193.5 lb

## 2016-08-25 DIAGNOSIS — Z953 Presence of xenogenic heart valve: Secondary | ICD-10-CM | POA: Diagnosis not present

## 2016-08-25 DIAGNOSIS — Z8249 Family history of ischemic heart disease and other diseases of the circulatory system: Secondary | ICD-10-CM | POA: Diagnosis not present

## 2016-08-25 DIAGNOSIS — Z79899 Other long term (current) drug therapy: Secondary | ICD-10-CM | POA: Insufficient documentation

## 2016-08-25 DIAGNOSIS — E119 Type 2 diabetes mellitus without complications: Secondary | ICD-10-CM | POA: Diagnosis not present

## 2016-08-25 DIAGNOSIS — E785 Hyperlipidemia, unspecified: Secondary | ICD-10-CM | POA: Insufficient documentation

## 2016-08-25 DIAGNOSIS — I5032 Chronic diastolic (congestive) heart failure: Secondary | ICD-10-CM | POA: Insufficient documentation

## 2016-08-25 DIAGNOSIS — E7849 Other hyperlipidemia: Secondary | ICD-10-CM

## 2016-08-25 DIAGNOSIS — Z818 Family history of other mental and behavioral disorders: Secondary | ICD-10-CM | POA: Insufficient documentation

## 2016-08-25 DIAGNOSIS — Z794 Long term (current) use of insulin: Secondary | ICD-10-CM | POA: Diagnosis not present

## 2016-08-25 DIAGNOSIS — I2723 Pulmonary hypertension due to lung diseases and hypoxia: Secondary | ICD-10-CM | POA: Diagnosis not present

## 2016-08-25 DIAGNOSIS — G4733 Obstructive sleep apnea (adult) (pediatric): Secondary | ICD-10-CM | POA: Diagnosis not present

## 2016-08-25 DIAGNOSIS — J449 Chronic obstructive pulmonary disease, unspecified: Secondary | ICD-10-CM | POA: Diagnosis not present

## 2016-08-25 DIAGNOSIS — E784 Other hyperlipidemia: Secondary | ICD-10-CM | POA: Diagnosis not present

## 2016-08-25 DIAGNOSIS — I480 Paroxysmal atrial fibrillation: Secondary | ICD-10-CM

## 2016-08-25 DIAGNOSIS — I272 Pulmonary hypertension, unspecified: Secondary | ICD-10-CM | POA: Insufficient documentation

## 2016-08-25 DIAGNOSIS — Z7901 Long term (current) use of anticoagulants: Secondary | ICD-10-CM | POA: Insufficient documentation

## 2016-08-25 DIAGNOSIS — I2510 Atherosclerotic heart disease of native coronary artery without angina pectoris: Secondary | ICD-10-CM | POA: Insufficient documentation

## 2016-08-25 LAB — LIPID PANEL
Cholesterol: 142 mg/dL (ref 0–200)
HDL: 32 mg/dL — ABNORMAL LOW (ref >40)
LDL Cholesterol: 74 mg/dL (ref 0–99)
Total CHOL/HDL Ratio: 4.4 RATIO
Triglycerides: 178 mg/dL — ABNORMAL HIGH (ref <150)
VLDL: 36 mg/dL (ref 0–40)

## 2016-08-25 LAB — BASIC METABOLIC PANEL
Anion gap: 6 (ref 5–15)
BUN: 16 mg/dL (ref 6–20)
CO2: 29 mmol/L (ref 22–32)
Calcium: 8.8 mg/dL — ABNORMAL LOW (ref 8.9–10.3)
Chloride: 99 mmol/L — ABNORMAL LOW (ref 101–111)
Creatinine, Ser: 1.07 mg/dL — ABNORMAL HIGH (ref 0.44–1.00)
GFR calc Af Amer: >60 mL/min (ref >60)
GFR calc non Af Amer: 52 mL/min — ABNORMAL LOW (ref >60)
Glucose, Bld: 183 mg/dL — ABNORMAL HIGH (ref 65–99)
Potassium: 4.4 mmol/L (ref 3.5–5.1)
Sodium: 134 mmol/L — ABNORMAL LOW (ref 135–145)

## 2016-08-25 MED ORDER — FUROSEMIDE 80 MG PO TABS: ORAL_TABLET | ORAL | 6 refills | Status: DC

## 2016-08-25 MED ORDER — POTASSIUM CHLORIDE CRYS ER 20 MEQ PO TBCR: 20.0000 meq | EXTENDED_RELEASE_TABLET | Freq: Two times a day (BID) | ORAL | 6 refills | Status: DC

## 2016-08-25 NOTE — Patient Instructions (Signed)
Increase Furosemide (Lasix) to 80 mg (1 tab) in AM and 40 mg (1/2 tab) at 3 pm  Increase Potassium to 20 meq Twice daily   Labs today  We will contact you in 3 months to schedule your next appointment.

## 2016-08-25 NOTE — Progress Notes (Signed)
PCP: Dr. Eppie Gibson HF Cardiology: Dr. Aundra Dubin  66 yo with COPD, mitral stenosis s/p bioprosthetic MV replacement, paroxysmal atrial fibrillation, GI bleeding, and pulmonary hypertension/RV failure presents for cardiology followup.  She was admitted with GI bleeding, thought to be AVMs, in 2/18 and warfarin was stopped.  She was admitted in 3/18 with chest pain, dyspnea, and elevated troponin.  She had not been taking her Lasix regularly.  Coronary angiography showed nonobstructive CAD, suspect demand ischemia. RHC showed elevated right and left heart filling pressures and severe pulmonary hypertension.  V/Q scan showed no chronic PE, and PFTs showed severe obstructive lung disease.  Echo showed severe RV dilation with mildly decreased systolic function.  She was diuresed and eventually discharged to [facility].   She has home oxygen but does not use regularly.  She uses her CPAP.  Still smoking, failed Chantix in the past . Breathing is "up and down."  She is still short of breath walking short distances.  Not very active. No orthopnea/PND. She has episodes of chest pain that occur infrequently and not associated with exertion, tend to be sharp, only last a few seconds then resolve.  I started her on coumadin with INR goal 1.5-2.5. She has tolerated this so far.  No melena/BRBPR. No more falls.  Weight is up 6 lbs.   Labs (3/18): K 4.8 => 3.9, creatinine 1.2 => 1.09, hgb 10.2 => 10.7, BNP 131 Labs (5/18): K 4, creatinine 1.0   PMH: 1. Mitral stenosis: s/p bioprosthetic mitral valve replacement.  2. COPD: Severe by 3/18 PFTs.  She is still smoking.  3. Atrial fibrillation: Paroxysmal. Has been off warfarin since GI bleed in 2/18.  4. OSA: Uses CPAP.  5. Type II diabetes. 6. Pulmonary hypertension/RV failure: Suspect primarily group 3 PH due to underlying lung disease (COPD).  - RHC (3/18) with mean RA 14, PA 77/28 mean 47, mean PCWP 23, CI 2.6, PVR 4.9.  - Echo (3/18): EF 60-65%, severe RV dilation  with mildly decreased RV systolic function, PASP 76 mmHg.  Bioprosthetic MV looks ok.  - V/Q scan (3/18): No evidence for chronic PE.  - PFTs (3/18): Severe obstructive airways disease.  7. Anemia: Fe deficiency and anemia of chronic disease.   - Suspect GI AVMs as cause of 2/18 bleed . Coumadin stopped.  8. CAD: LHC (3/18) with 30% LM, 40% proximal RCA.  9. Hyperlipidemia  SH: Lives alone in [place].  Active smoker.  No ETOH.   Family History  Problem Relation Age of Onset  . Peptic Ulcer Disease Father   . Heart attack Father 60       Died of MI at age 72  . Heart attack Brother 72       Died of MI at age 39  . Obesity Brother   . Pneumonia Mother   . Healthy Sister   . Lupus Daughter   . Obsessive Compulsive Disorder Daughter    ROS: All systems reviewed and negative except as per HPI.   Current Outpatient Prescriptions  Medication Sig Dispense Refill  . albuterol (PROVENTIL HFA;VENTOLIN HFA) 108 (90 Base) MCG/ACT inhaler Inhale 2 puffs into the lungs every 6 (six) hours as needed for wheezing or shortness of breath.    . ALPRAZolam (XANAX) 1 MG tablet TAKE 1/2 TO 1 TABLET BY MOUTH TWICE DAILY AS NEEDED FOR ANXIETY 60 tablet 5  . Blood Glucose Monitoring Suppl (ONETOUCH VERIO) W/DEVICE KIT 1 each by Does not apply route 4 (four) times daily.  1 kit 0  . chlorpheniramine (CHLOR-TRIMETON) 4 MG tablet Take 1 tablet (4 mg total) by mouth 3 (three) times daily as needed for allergies. 90 tablet 11  . cyclobenzaprine (FLEXERIL) 5 MG tablet Take 1 tablet (5 mg total) by mouth 3 (three) times daily as needed for muscle spasms. 90 tablet 3  . EASY TOUCH PEN NEEDLES 31G X 5 MM MISC USE TO INJECT INSULIN 5 TIMES DAILY. 200 each 8  . ferrous sulfate 325 (65 FE) MG tablet Take 325 mg by mouth 2 (two) times daily with a meal.    . FLUoxetine 60 MG TABS Take 60 mg by mouth daily. 30 tablet 11  . fluticasone (FLONASE) 50 MCG/ACT nasal spray Place 2 sprays into both nostrils daily. 16 g 3  .  Fluticasone-Salmeterol (ADVAIR DISKUS) 500-50 MCG/DOSE AEPB Inhale 1 puff into the lungs 2 (two) times daily. 180 each 3  . furosemide (LASIX) 80 MG tablet Take 1 tab in AM and 1/2 tab at 3 pm 45 tablet 6  . gabapentin (NEURONTIN) 300 MG capsule Take 2 capsules (600 mg total) by mouth 3 (three) times daily. 540 capsule 3  . HYDROcodone-acetaminophen (NORCO/VICODIN) 5-325 MG tablet Take 1-2 tablets by mouth every 6 (six) hours as needed for severe pain. 120 tablet 0  . insulin aspart (NOVOLOG) 100 UNIT/ML FlexPen Inject 18 Units into the skin 3 (three) times daily with meals. 18 mL 10  . Insulin Detemir (LEVEMIR) 100 UNIT/ML Pen USE 37 UNITS TWICE DAILY 21 mL 11  . Insulin Syringe-Needle U-100 31G X 15/64" 0.5 ML MISC Use to inject insulin up to 4 times a day 400 each 3  . Lancets Misc. (ACCU-CHEK FASTCLIX LANCET) KIT Check your blood 4 times a day dx code 250.00 insulin requiring 1 kit 2  . metoprolol succinate (TOPROL-XL) 25 MG 24 hr tablet Take 1 tablet (25 mg total) by mouth daily. 30 tablet 3  . nystatin cream (MYCOSTATIN) Apply 1 application topically 2 (two) times daily.    Marland Kitchen omeprazole (PRILOSEC) 40 MG capsule Take 1 capsule (40 mg total) by mouth 2 (two) times daily. 180 capsule 3  . ONETOUCH VERIO test strip Check blood sugar up to 4 times a day 350 each 11  . OXYGEN Inhale 2 L into the lungs continuous.    . potassium chloride SA (K-DUR,KLOR-CON) 20 MEQ tablet Take 1 tablet (20 mEq total) by mouth 2 (two) times daily. 60 tablet 6  . promethazine (PHENERGAN) 12.5 MG tablet Take 1 tablet (12.5 mg total) by mouth every 8 (eight) hours as needed for nausea or vomiting. 30 tablet 0  . rosuvastatin (CRESTOR) 20 MG tablet Take 1 tablet (20 mg total) by mouth at bedtime. 90 tablet 3  . tiotropium (SPIRIVA HANDIHALER) 18 MCG inhalation capsule Place 1 capsule (18 mcg total) into inhaler and inhale daily. 90 capsule 3  . warfarin (COUMADIN) 2.5 MG tablet Take 1 tablet (2.5 mg total) by mouth daily  at 6 PM. 30 tablet 1  . fluconazole (DIFLUCAN) 150 MG tablet Take 1 tablet (150 mg total) by mouth once as needed. (Patient not taking: Reported on 08/04/2016) 2 tablet 3   No current facility-administered medications for this encounter.    BP (!) 128/54   Pulse (!) 52   Wt 193 lb 8 oz (87.8 kg)   SpO2 98% Comment: on 1L of O2  BMI 35.39 kg/m  General: NAD Neck: Thick, JVP 8-9 cm, no thyromegaly or thyroid nodule.  Lungs: Distant  breath sounds bilaterally.  CV: Nondisplaced PMI.  Heart regular S1/S2, no S3/S4, no murmur.  1+ edema 1/3 up lower legs bilaterally.  No carotid bruit.  Difficult to palpate pedal pulses but feet warm.  Abdomen: Soft, nontender, no hepatosplenomegaly, no distention.  Skin: Intact without lesions or rashes.  Neurologic: Alert and oriented x 3.  Psych: Normal affect. Extremities: No clubbing or cyanosis.  HEENT: Normal.   Assessment/Plan: 1. Pulmonary hypertension: I suspect that this is primarily group 2 PH (pulmonary venous hypertension from LV diastolic dysfunction) and group 3 PH (severe COPD, OSA).  V/Q scan showed no evidence for chronic PE.  I do not think that she will benefit from selective pulmonary vasodilators.  I think that the key here will continue to be good diuresis + use of oxygen and CPAP.  2. Chronic diastolic CHF with prominent RV dysfunction in the setting of severe COPD.  She is volume overloaded on exam today with NYHA class IIIb symptoms (dyspnea is likely a combination of CHF and COPD) => dyspnea is chronic.  - She has only been taking Lasix 80 mg daily.  I will have her increase Lasix to 80 qam/40 qpm (take the pm Lasix around 3 pm so she is not up all night urinating => she had cut out her pm Lasix because of this).  Increase KCl to 20 bid.  BMET/BNP today.  3. Atrial fibrillation: Paroxysmal.  She is in NSR by exam today.  No melena or BRBPR.  She is not a good candidate for Eliquis or Xarelto with bioprosthetic MV.  She had been off  anticoagulation due to GI bleeding.  I restarted her on warfarin with INR goal 1.5-2.5.  She has tolerated this so far.  - Keep INR 1.5-2.5 for now.   - CBC again today.   4. h/o GI bleeding: Possible GI AVM.  No overt bleeding after starting coumadin with lower INR goal. CBC today.  5. COPD: Severe by PFTs. Unfortunately, she is back to smoking.  I encouraged her to quit.  6. CAD: Nonobstructive. She is on a statin.  - Check lipids today.   Followup 3 months.   Loralie Champagne 08/25/2016

## 2016-09-08 ENCOUNTER — Other Ambulatory Visit (HOSPITAL_COMMUNITY): Payer: Self-pay | Admitting: Interventional Radiology

## 2016-09-08 ENCOUNTER — Other Ambulatory Visit: Payer: Self-pay | Admitting: Radiology

## 2016-09-08 ENCOUNTER — Telehealth: Payer: Self-pay | Admitting: Dietician

## 2016-09-08 ENCOUNTER — Ambulatory Visit (INDEPENDENT_AMBULATORY_CARE_PROVIDER_SITE_OTHER): Payer: PPO | Admitting: Pharmacist

## 2016-09-08 DIAGNOSIS — C642 Malignant neoplasm of left kidney, except renal pelvis: Secondary | ICD-10-CM

## 2016-09-08 DIAGNOSIS — I480 Paroxysmal atrial fibrillation: Secondary | ICD-10-CM | POA: Diagnosis not present

## 2016-09-08 DIAGNOSIS — Z7901 Long term (current) use of anticoagulants: Secondary | ICD-10-CM

## 2016-09-08 LAB — POCT INR: INR: 2.1

## 2016-09-08 NOTE — Patient Instructions (Signed)
Patient instructed to take medications as defined in the Anti-coagulation Track section of this encounter.  Patient instructed to OMIT today's dose as scheduled *(OMIT every Monday's dose); OMIT tomorrow's dose for Tuesday 31-JUL-18 FOR JUST ONE TIME--because you are taking ONE DOSE of your oral antifungal medication. Should you elect to take more than one dose--you must alert me (Pharmacist, Jorene Guest at [phone]) so further adjustment in warfarin dosing can be undertaken. Patient verbalized understanding of these instructions.

## 2016-09-08 NOTE — Progress Notes (Signed)
Anticoagulation Management [name] is a 66 y.o. female who reports to the clinic for monitoring of warfarin treatment.    Indication: atrial fibrillation  Duration: indefinite Supervising physician: [facility] Visit History: Patient does not report signs/symptoms of bleeding or thromboembolism  Other recent changes: No diet, medications, lifestyle changes endorsed except as noted in patient findings section.  Anticoagulation Episode Summary    Current INR goal:   1.5-2.5  TTR:   79.4 % (3.5 y)  Next INR check:   09/22/2016  INR from last check:   2.10 (09/08/2016)  Weekly max warfarin dose:     Target end date:     INR check location:   [facility]  Preferred lab:     Send INR reminders to:   [facility]   Indications   Paroxysmal atrial fibrillation ([place]) [I48.0] Long term current use of anticoagulant therapy [Z79.01]       Comments:         Anticoagulation Care Providers    Provider Role Specialty Phone number   Lelon Perla, MD  Cardiology [phone]      Allergies  Allergen Reactions  . Morphine And Related Other (See Comments)    Injection site reaction  . Oxycontin [Oxycodone] Other (See Comments)    headache  . Tramadol Hcl Swelling    Ankle swelling  . Lorazepam Other (See Comments)    Patient's sister noted that ativan caused the patient to become extremely confused during hospitalization 09/2010; tolerates Xanax   Prior to Admission medications   Medication Sig Start Date End Date Taking? Authorizing Provider  albuterol (PROVENTIL HFA;VENTOLIN HFA) 108 (90 Base) MCG/ACT inhaler Inhale 2 puffs into the lungs every 6 (six) hours as needed for wheezing or shortness of breath.   Yes [provider]  ALPRAZolam (XANAX) 1 MG tablet TAKE 1/2 TO 1 TABLET BY MOUTH TWICE DAILY AS NEEDED FOR ANXIETY 07/17/16  Yes [name], MD  Blood Glucose Monitoring Suppl (ONETOUCH VERIO) W/DEVICE KIT 1 each  by Does not apply route 4 (four) times daily. 07/25/14  Yes [name], MD  chlorpheniramine (CHLOR-TRIMETON) 4 MG tablet Take 1 tablet (4 mg total) by mouth 3 (three) times daily as needed for allergies. 10/06/14  Yes [name], MD  cyclobenzaprine (FLEXERIL) 5 MG tablet Take 1 tablet (5 mg total) by mouth 3 (three) times daily as needed for muscle spasms. 01/11/16  Yes [name], MD  EASY TOUCH PEN NEEDLES 31G X 5 MM MISC USE TO INJECT INSULIN 5 TIMES DAILY. 07/17/16  Yes [name], MD  ferrous sulfate 325 (65 FE) MG tablet Take 325 mg by mouth 2 (two) times daily with a meal.   Yes [provider]  fluconazole (DIFLUCAN) 150 MG tablet Take 1 tablet (150 mg total) by mouth once as needed. 05/16/16  Yes [name], MD  FLUoxetine 60 MG TABS Take 60 mg by mouth daily. 07/30/16  Yes [name], MD  fluticasone (FLONASE) 50 MCG/ACT nasal spray Place 2 sprays into both nostrils daily. 02/08/15  Yes [name], MD  Fluticasone-Salmeterol (ADVAIR DISKUS) 500-50 MCG/DOSE AEPB Inhale 1 puff into the lungs 2 (two) times daily. 12/14/15  Yes [name], MD  furosemide (LASIX) 80 MG tablet Take 1 tab in AM and 1/2 tab at 3 pm 08/25/16  Yes [name], MD  gabapentin (NEURONTIN) 300 MG capsule Take 2 capsules (600 mg total) by mouth 3 (three) times daily. 11/16/15  Yes [name], MD  HYDROcodone-acetaminophen (NORCO/VICODIN) 5-325 MG tablet Take 1-2 tablets by mouth every 6 (six) hours as needed for severe pain. 07/17/16  Yes [name], MD  insulin aspart (NOVOLOG) 100 UNIT/ML FlexPen Inject 18 Units into the skin 3 (three) times daily with meals. 06/12/16  Yes [name], MD  Insulin Detemir (LEVEMIR) 100 UNIT/ML Pen USE 37 UNITS TWICE DAILY 08/06/16  Yes [name], MD  Insulin Syringe-Needle U-100 31G X 15/64" 0.5 ML MISC Use to inject insulin up to 4 times a day 04/17/16  Yes [name], MD  Lancets Misc. (ACCU-CHEK FASTCLIX LANCET) KIT  Check your blood 4 times a day dx code 250.00 insulin requiring 01/19/13  Yes [name], MD  metoprolol succinate (TOPROL-XL) 25 MG 24 hr tablet Take 1 tablet (25 mg total) by mouth daily. 06/11/16  Yes [name], MD  nystatin cream (MYCOSTATIN) Apply 1 application topically 2 (two) times daily. 07/10/16  Yes [provider]  omeprazole (PRILOSEC) 40 MG capsule Take 1 capsule (40 mg total) by mouth 2 (two) times daily. 07/17/16  Yes [name], MD  [facility] VERIO test strip Check blood sugar up to 4 times a day 02/28/15  Yes [name], MD  OXYGEN Inhale 2 L into the lungs continuous.   Yes [provider]  potassium chloride SA (K-DUR,KLOR-CON) 20 MEQ tablet Take 1 tablet (20 mEq total) by mouth 2 (two) times daily. 08/25/16  Yes [name], MD  promethazine (PHENERGAN) 12.5 MG tablet Take 1 tablet (12.5 mg total) by mouth every 8 (eight) hours as needed for nausea or vomiting. 07/13/15  Yes [name], DO  rosuvastatin (CRESTOR) 20 MG tablet Take 1 tablet (20 mg total) by mouth at bedtime. 08/14/16  Yes [name], MD  tiotropium (SPIRIVA HANDIHALER) 18 MCG inhalation capsule Place 1 capsule (18 mcg total) into inhaler and inhale daily. 08/14/16  Yes [name], MD  warfarin (COUMADIN) 2.5 MG tablet Take 1 tablet (2.5 mg total) by mouth daily at 6 PM. 07/17/16  Yes [name], MD   Past Medical History:  Diagnosis Date  . Adenomatous polyps 05/14/2011   Colonoscopy (05/2011): 4 mm adenomatous polyp excised endoscopically Colonoscopy (02/2002): Adenomatous polyp excised endoscopically   . Allergic rhinitis 06/01/2012  . Anemia of chronic disease 01/01/2013  . Anxiety 07/24/2010  . Aortic atherosclerosis ([place]) 10/19/2014   Seen on CT scan, currently asymptomatic  . 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 Asymptomatic cholelithiasis 09/25/2015   Seen on CT scan  08/2015  . Bilateral cataracts 08/04/2012   Visually insignificant   . Carotid artery stenosis    s/p right endarterectomy (06/2010) Carotid US (07/2010):  Left: Moderate-to-severe (60-79%) calcific and non-calcific plaque origin and proximal ICA and ECA   . Chronic congestive heart failure with left ventricular diastolic dysfunction ([place]) 10/21/2010  . Chronic constipation 02/03/2011  . Chronic daily headache 01/16/2014  . Chronic low back pain 10/06/2012  . Chronic venous insufficiency 08/04/2012  . Clear cell renal cell carcinoma ([place]) 07/21/2011   s/p cryoablation of left RCC in 09/2011 by [name]. Followed by [name]  [facility]) .    Marland Kitchen COPD (chronic obstructive pulmonary disease) with emphysema ([facility])    PFTs 02/2012: FEV1 0.92 (40%), ratio 69, 27% increase in FEV1 with BD, TLC 91%, severe airtrapping, DLCO49% On chronic home O2. Pulmonary rehab referral 05/2012   . Depression 11/19/2005  .  Esophageal stenosis 08/08/2015   Mild, benign-appearing on EGD 06/29/2015  . Fibromyalgia 08/29/2010  . Gastroesophageal reflux disease   . History of blood transfusion    "several times"   . Hyperlipidemia LDL goal < 100 11/20/2005  . Internal hemorrhoids 08/04/2012  . Mitral stenosis    s/p Mitral valve replacement with a 27-mm pericardial porcine valve (Medtronic Mosaic valve, serial #[id number] on 09/20/10, Dr. Prescott Gum)   . Moderate to severe pulmonary hypertension ([place])    2014 TEE w PA peak pressure 46 mmHg, s/p MV replacement   . Obesity (BMI 30.0-34.9) 10/23/2011  . Obstructive sleep apnea    Nocturnal polysomnography (06/2009): Moderate sleep apnea/ hypopnea syndrome , AHI 17.8 per hour with nonpositional hypopneas. CPAP titration to 12 CWP, AHI 2.4 per hour. On nocturnal CPAP via a small resMed Quattro full-face mask with heated humidifier.   . Osteoporosis    DEXA (12/09/2011): L-spine T -3.7, left hip T -1.4 DEXA (12/2004): L-spine T -2.6, left hip -0.1   . Paroxysmal atrial  fibrillation ([place]) 10/22/2010   s/p Left atrial maze procedure for paroxysmal atrial fibrillation on 09/20/2010 by Dr Prescott Gum.  Subsequent splenic infarct, decision was made to re-anticoagulate with coumadin, likely life-long as this is the most likely cause of the splenic infarct.   . Pulmonary hypertension due to chronic obstructive pulmonary disease ([place]) 04/25/2016  . Right nephrolithiasis 09/06/2014   5 mm non-obstructing calculus seen on CT scan 09/05/2014   . Seborrheic keratosis 09/28/2015  . Shortness of breath dyspnea   . Tobacco abuse 07/28/2012  . Type 2 diabetes mellitus with diabetic neuropathy [facility])    Social History   Social History  . Marital status: Divorced    Spouse name: N/A  . Number of children: N/A  . Years of education: N/A   Social History Main Topics  . Smoking status: Current Every Day Smoker    Packs/day: 1.00    Years: 53.00    Types: Cigarettes    Start date: 11/01/1962  . Smokeless tobacco: Never Used     Comment: 1/2 to 1 pack per day  . Alcohol use No  . Drug use: No  . Sexual activity: No   Other Topics Concern  . Not on file   Social History Narrative   Lives alone in [place] ([place]) with 60 pound chow   Worked at [employer] for 18 years   No car   Family History  Problem Relation Age of Onset  . Peptic Ulcer Disease Father   . Heart attack Father 24       Died of MI at age 89  . Heart attack Brother 83       Died of MI at age 63  . Obesity Brother   . Pneumonia Mother   . Healthy Sister   . Lupus Daughter   . Obsessive Compulsive Disorder Daughter     ASSESSMENT Recent Results: The most recent result is correlated with 15 mg per week: Lab Results  Component Value Date   INR 2.10 09/08/2016   INR 2.50 08/04/2016   INR 2.10 06/30/2016    Anticoagulation Dosing: INR as of 09/08/2016 and Previous Warfarin Dosing Information    INR Dt INR Goal [name] Sun Mon Tue Wed Thu Fri Sat   09/08/2016 2.10 1.5-2.5 15  mg 2.5 mg 0 mg 2.5 mg 2.5 mg 2.5 mg 2.5 mg 2.5 mg    Previous description   Take ONE-HALF (1/2) tablet  of your CURRENT 17m peach colored warfarin tablet by mouth, once-daily at 6PM all days of week, EXCEPT on Mondays. Do NOT take any warfarin on Mondays of each week. When you have expended all tablets of your 576mpeach colored warfarin tablets and are ready to transition to use of your 2.49m70mreen colored warfarin tablets that Dr. KliEppie Gibsons provided you a prescription for--please let me know by calling:  [phone] that your tablet strength can be changed in the www.doseresponse.com database.    Anticoagulation Warfarin Dose Instructions as of 09/08/2016      Total Sun Mon Tue Wed Thu Fri Sat   New Dose 15 mg 2.5 mg 0 mg 2.5 mg 2.5 mg 2.5 mg 2.5 mg 2.5 mg     (5 mg x 0.5)  -  (5 mg x 0.5)  (5 mg x 0.5)  (5 mg x 0.5)  (5 mg x 0.5)  (5 mg x 0.5)                         Description   Take ONE-HALF (1/2) tablet of your CURRENT 49mg58mach colored warfarin tablet by mouth, once-daily at 6PM all days of week, EXCEPT on Mondays. Do NOT take any warfarin on Mondays of each week. When you have expended all tablets of your 49mg 10mch colored warfarin tablets and are ready to transition to use of your 2.49mg g38mn colored warfarin tablets that Dr. Klima Eppie Gibsonrovided you a prescription for--please let me know by calling:  [phone] your tablet strength can be changed in the www.doseresponse.com database.      INR today: Therapeutic  PLAN Weekly dose was unchanged.  Patient Instructions  Patient instructed to take medications as defined in the Anti-coagulation Track section of this encounter.  Patient instructed to OMIT today's dose as scheduled *(OMIT every Monday's dose); OMIT tomorrow's dose for Tuesday 31-JUL-18 FOR JUST ONE TIME--because you are taking ONE DOSE of your oral antifungal medication. Should you elect to take more than one dose--you must alert me (Pharmacist, James Jorene [phone] adjustment in warfarin dosing can be undertaken. Patient verbalized understanding of these instructions.     Patient advised to contact clinic or seek medical attention if signs/symptoms of bleeding or thromboembolism occur.  Patient verbalized understanding by repeating back information and was advised to contact me if further medication-related questions arise. Patient was also provided an information handout.  Follow-up Return in 2 weeks (on 09/22/2016) for Follow up INR at 1030h.  Groce Caryl BisD, CACP, CPP  15 minutes spent face-to-face with the patient during the encounter. 50% of time spent on education. 50% of time was spent on fingerstick point of care INR sample collection, processing, result interpretation and documentation in EPIC/CHL and [email or web address]
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
## 2016-09-09 NOTE — Progress Notes (Signed)
I have reviewed Dr. Gladstone Pih note.  Patient is on coumadin for Afib and INR at goal.  Dose unchanged.

## 2016-09-10 ENCOUNTER — Other Ambulatory Visit: Payer: Self-pay | Admitting: *Deleted

## 2016-09-10 ENCOUNTER — Other Ambulatory Visit: Payer: Self-pay | Admitting: Internal Medicine

## 2016-09-10 ENCOUNTER — Other Ambulatory Visit: Payer: Self-pay | Admitting: Radiology

## 2016-09-10 DIAGNOSIS — B373 Candidiasis of vulva and vagina: Secondary | ICD-10-CM

## 2016-09-10 DIAGNOSIS — I480 Paroxysmal atrial fibrillation: Secondary | ICD-10-CM

## 2016-09-10 DIAGNOSIS — B3731 Acute candidiasis of vulva and vagina: Secondary | ICD-10-CM

## 2016-09-10 DIAGNOSIS — C642 Malignant neoplasm of left kidney, except renal pelvis: Secondary | ICD-10-CM

## 2016-09-11 ENCOUNTER — Other Ambulatory Visit: Payer: Self-pay | Admitting: Internal Medicine

## 2016-09-15 IMAGING — DX DG CHEST 2V
2 series · 2 of 2 positions shown · non-contrast
Comparison: 12/19/2013

CLINICAL DATA: Chest pain and heartburn

EXAM:
CHEST  2 VIEW

[chest pa]
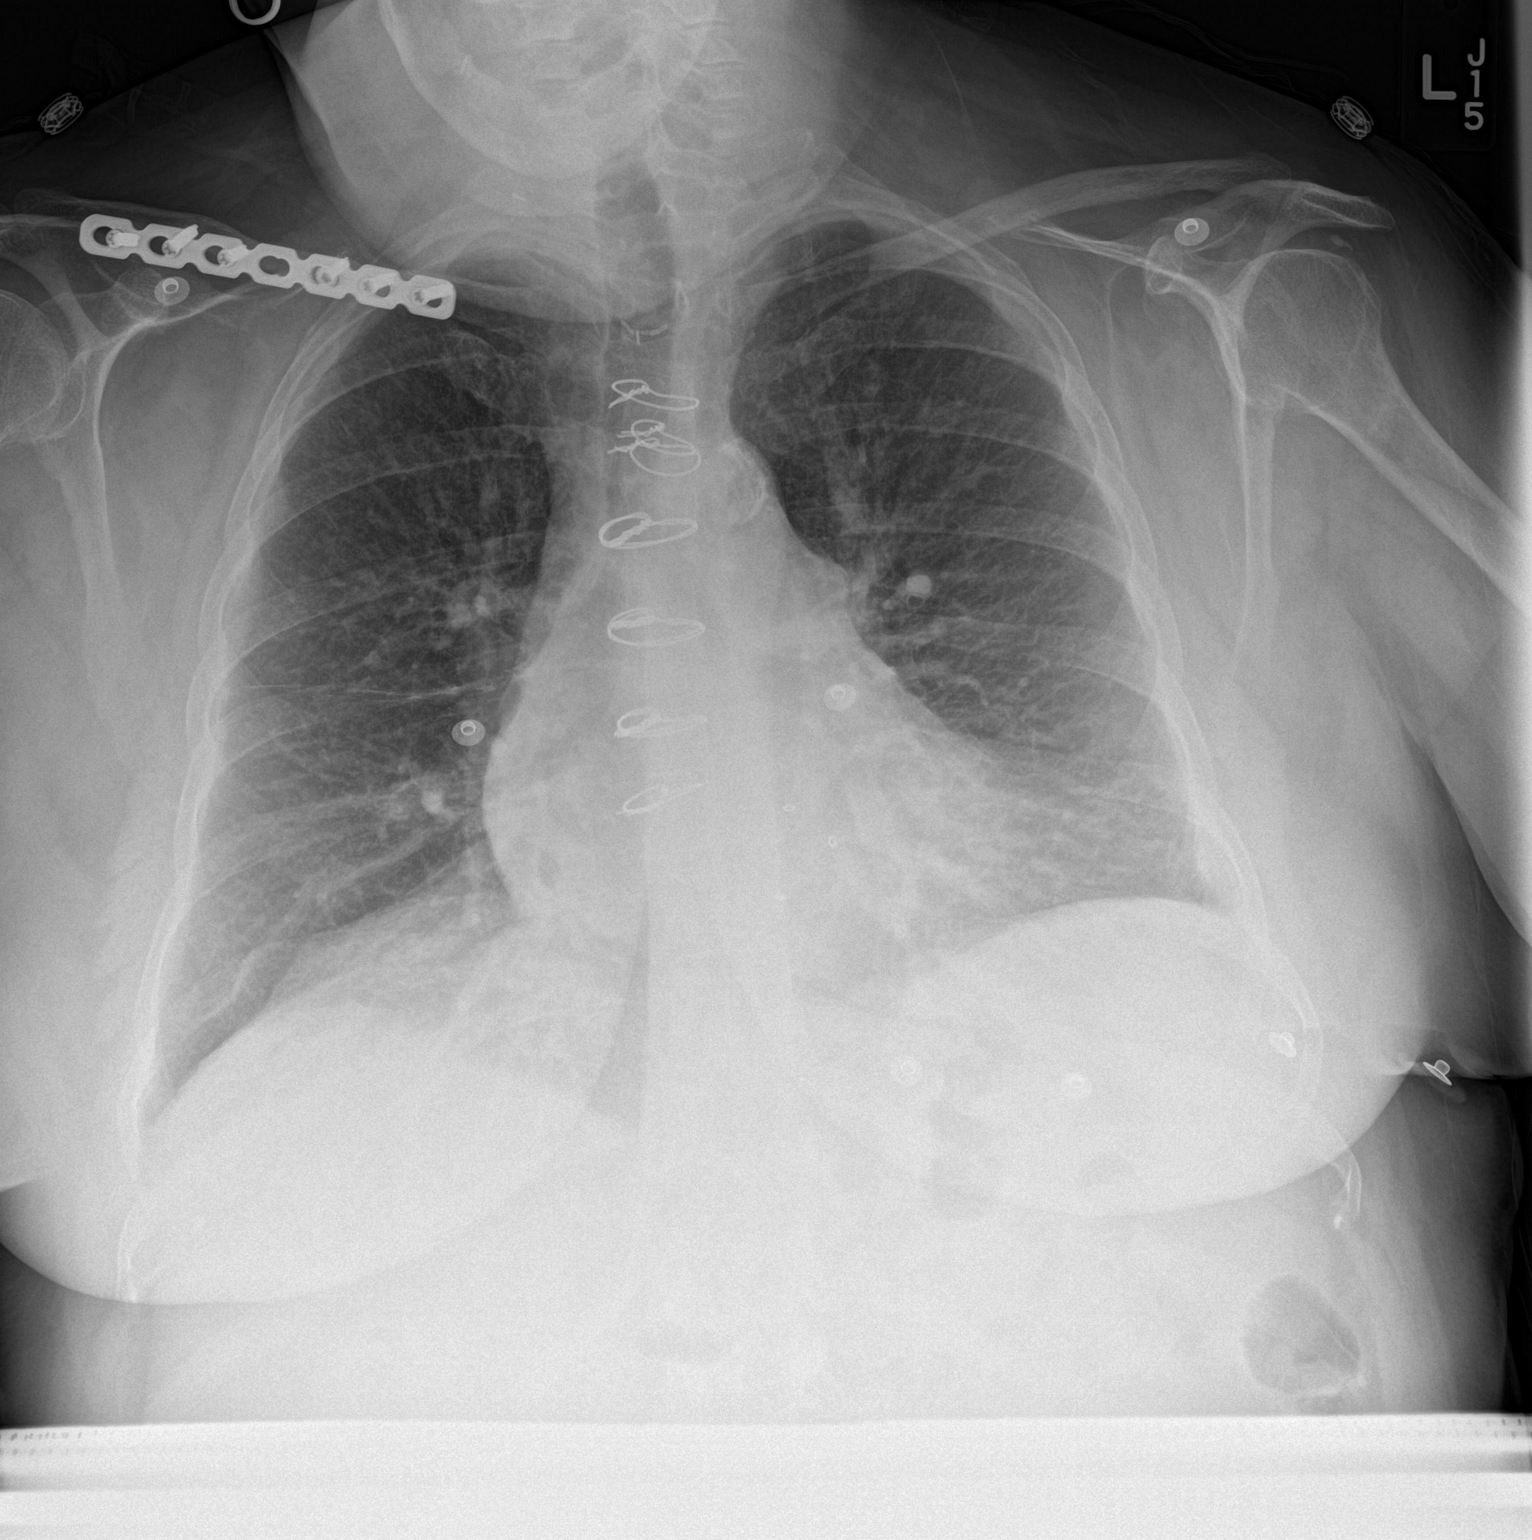

[chest lat]
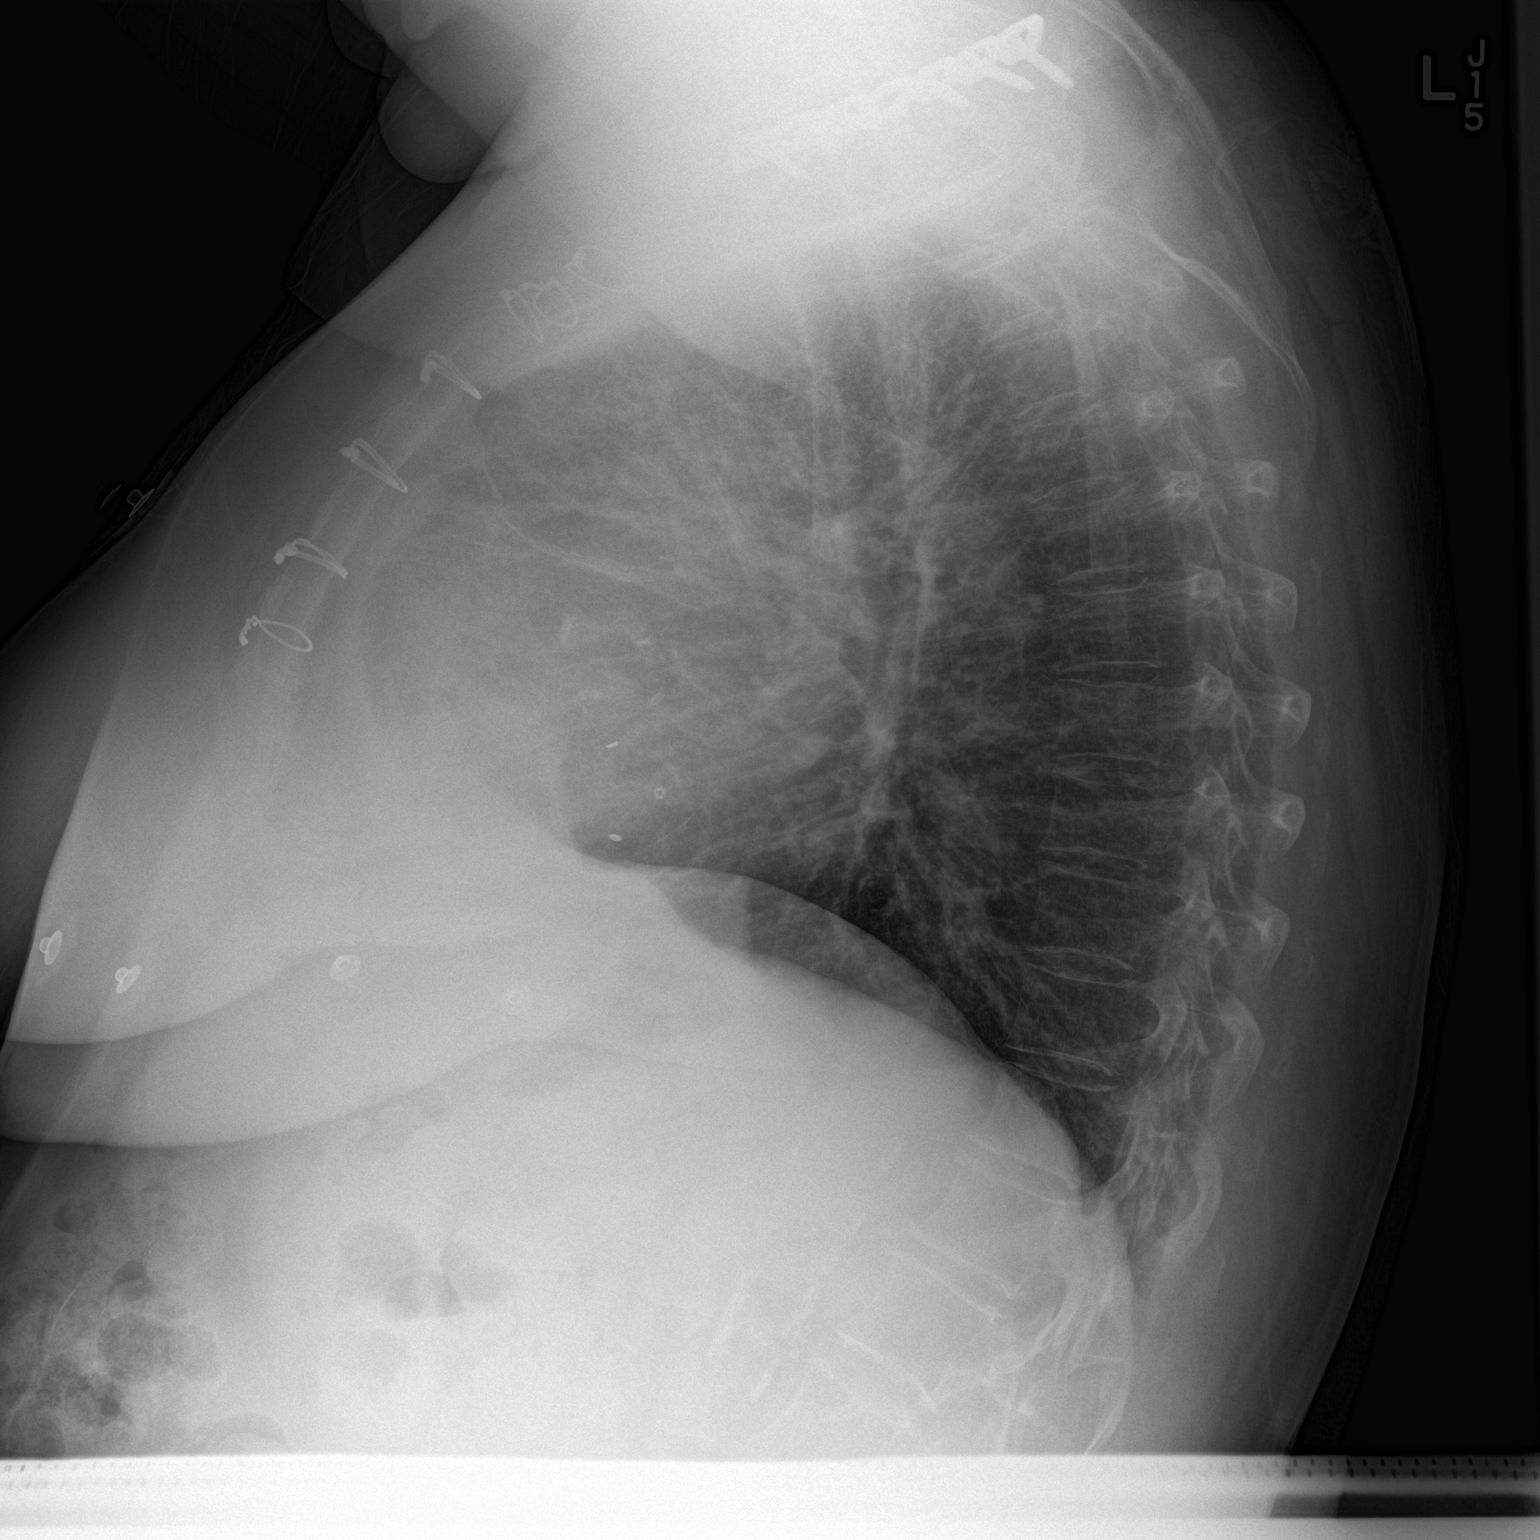

[2 of 2 positions shown; findings below may reference images not displayed]

FINDINGS: Normal heart size and stable aortic contours. The patient is status
post mitral valve replacement. There is no edema, consolidation,
effusion, or pneumothorax. Status post ORIF of the right clavicle.
No acute osseous findings.
IMPRESSION: 1. No active cardiopulmonary disease.
2. Mitral valve replacement.

## 2016-09-22 ENCOUNTER — Ambulatory Visit: Payer: PPO

## 2016-09-25 ENCOUNTER — Other Ambulatory Visit: Payer: Self-pay | Admitting: Radiology

## 2016-09-25 DIAGNOSIS — G4733 Obstructive sleep apnea (adult) (pediatric): Secondary | ICD-10-CM | POA: Diagnosis not present

## 2016-09-25 DIAGNOSIS — C642 Malignant neoplasm of left kidney, except renal pelvis: Secondary | ICD-10-CM

## 2016-09-29 ENCOUNTER — Ambulatory Visit: Payer: PPO

## 2016-09-30 ENCOUNTER — Telehealth: Payer: Self-pay

## 2016-09-30 NOTE — Telephone Encounter (Signed)
Asking to speak with a nurse. Please call back.

## 2016-10-03 ENCOUNTER — Other Ambulatory Visit: Payer: Self-pay | Admitting: Internal Medicine

## 2016-10-03 ENCOUNTER — Telehealth: Payer: Self-pay | Admitting: Dietician

## 2016-10-03 DIAGNOSIS — Z794 Long term (current) use of insulin: Principal | ICD-10-CM

## 2016-10-03 DIAGNOSIS — B3731 Acute candidiasis of vulva and vagina: Secondary | ICD-10-CM

## 2016-10-03 DIAGNOSIS — E1140 Type 2 diabetes mellitus with diabetic neuropathy, unspecified: Secondary | ICD-10-CM

## 2016-10-03 DIAGNOSIS — B373 Candidiasis of vulva and vagina: Secondary | ICD-10-CM

## 2016-10-03 NOTE — Telephone Encounter (Signed)
Patient calls about knots under skin mostly on abdomen from insulin injections that take a week or longer to dissipate. She mostly uses her abdomen and arms for injections and she is not sure if it is being caused by one insulin or the other. She was advised not to inejct in the areas where there is a lump and to use alternative sites such as her buttocks for now. We can also consider a longer needle to be sure she is injecting into the fat tissue and not her skin.   She also asks about the [facility] (and personal as well)and is interested in wearing the pro starting Monday to attain better control of her blood sugars if Dr. Eppie Gibson approves.   Appointment scheduled for Monday for insulin injection assessment and review. She was asked to bring her needles with her.

## 2016-10-03 NOTE — Addendum Note (Signed)
Addended by: [name] D on: 10/03/2016 12:07 PM   Modules accepted: Orders

## 2016-10-03 NOTE — Telephone Encounter (Signed)
Done.  Thank You.

## 2016-10-06 ENCOUNTER — Ambulatory Visit (INDEPENDENT_AMBULATORY_CARE_PROVIDER_SITE_OTHER): Payer: PPO | Admitting: Pharmacist

## 2016-10-06 ENCOUNTER — Ambulatory Visit (INDEPENDENT_AMBULATORY_CARE_PROVIDER_SITE_OTHER): Payer: PPO | Admitting: Dietician

## 2016-10-06 DIAGNOSIS — Z7901 Long term (current) use of anticoagulants: Secondary | ICD-10-CM

## 2016-10-06 DIAGNOSIS — F17210 Nicotine dependence, cigarettes, uncomplicated: Secondary | ICD-10-CM | POA: Diagnosis not present

## 2016-10-06 DIAGNOSIS — E119 Type 2 diabetes mellitus without complications: Secondary | ICD-10-CM

## 2016-10-06 DIAGNOSIS — I480 Paroxysmal atrial fibrillation: Secondary | ICD-10-CM | POA: Diagnosis not present

## 2016-10-06 DIAGNOSIS — E1140 Type 2 diabetes mellitus with diabetic neuropathy, unspecified: Secondary | ICD-10-CM

## 2016-10-06 DIAGNOSIS — Z794 Long term (current) use of insulin: Secondary | ICD-10-CM

## 2016-10-06 DIAGNOSIS — Z713 Dietary counseling and surveillance: Secondary | ICD-10-CM | POA: Diagnosis not present

## 2016-10-06 LAB — POCT INR: INR: 2.2

## 2016-10-06 NOTE — Progress Notes (Signed)
Anticoagulation Management [name] is a 66 y.o. female who reports to the clinic for monitoring of warfarin treatment.    Indication: atrial fibrillation  Duration: indefinite Supervising physician: [name]  Anticoagulation Clinic Visit History: Patient does not report signs/symptoms of bleeding or thromboembolism  Other recent changes: No diet, medications, lifestyle changes endorsed to me by the patient.   Anticoagulation Episode Summary    Current INR goal:   1.5-2.5  TTR:   79.8 % (3.5 y)  Next INR check:   10/27/2016  INR from last check:   2.2 (10/06/2016)  Weekly max warfarin dose:     Target end date:     INR check location:   [facility]  Preferred lab:     Send INR reminders to:   [facility]   Indications   Paroxysmal atrial fibrillation ([place]) [I48.0] Long term current use of anticoagulant therapy [Z79.01]       Comments:         Anticoagulation Care Providers    Provider Role Specialty Phone number   Lelon Perla, MD  Cardiology [phone]      Allergies  Allergen Reactions  . Morphine And Related Other (See Comments)    Injection site reaction  . Oxycontin [Oxycodone] Other (See Comments)    headache  . Tramadol Hcl Swelling    Ankle swelling  . Lorazepam Other (See Comments)    Patient's sister noted that ativan caused the patient to become extremely confused during hospitalization 09/2010; tolerates Xanax   Prior to Admission medications   Medication Sig Start Date End Date Taking? Authorizing Provider  albuterol (PROVENTIL HFA;VENTOLIN HFA) 108 (90 Base) MCG/ACT inhaler Inhale 2 puffs into the lungs every 6 (six) hours as needed for wheezing or shortness of breath.   Yes [provider]  ALPRAZolam (XANAX) 1 MG tablet TAKE 1/2 TO 1 TABLET BY MOUTH TWICE DAILY AS NEEDED FOR ANXIETY 07/17/16  Yes [name], MD  Blood Glucose Monitoring Suppl (ONETOUCH VERIO) W/DEVICE KIT 1 each by Does not apply  route 4 (four) times daily. 07/25/14  Yes [name], MD  chlorpheniramine (CHLOR-TRIMETON) 4 MG tablet Take 1 tablet (4 mg total) by mouth 3 (three) times daily as needed for allergies. 10/06/14  Yes [name], MD  cyclobenzaprine (FLEXERIL) 5 MG tablet Take 1 tablet (5 mg total) by mouth 3 (three) times daily as needed for muscle spasms. 01/11/16  Yes [name], MD  EASY TOUCH PEN NEEDLES 31G X 5 MM MISC USE TO INJECT INSULIN 5 TIMES DAILY. 07/17/16  Yes [name], MD  ferrous sulfate 325 (65 FE) MG tablet Take 325 mg by mouth 2 (two) times daily with a meal.   Yes [provider]  fluconazole (DIFLUCAN) 150 MG tablet Take 1 tablet (150 mg total) by mouth once as needed. 05/16/16  Yes [name], MD  FLUoxetine 60 MG TABS Take 60 mg by mouth daily. 07/30/16  Yes [name], MD  fluticasone (FLONASE) 50 MCG/ACT nasal spray Place 2 sprays into both nostrils daily. 02/08/15  Yes [name], MD  Fluticasone-Salmeterol (ADVAIR DISKUS) 500-50 MCG/DOSE AEPB Inhale 1 puff into the lungs 2 (two) times daily. 12/14/15  Yes [name], MD  furosemide (LASIX) 80 MG tablet Take 1 tab in AM and 1/2 tab at 3 pm 08/25/16  Yes [name], MD  gabapentin (NEURONTIN) 300 MG capsule Take 2 capsules (600 mg total) by mouth 3 (three) times daily. 11/16/15  Yes [name], MD  HYDROcodone-acetaminophen (NORCO/VICODIN) 5-325 MG tablet Take 1-2 tablets by mouth every 6 (six) hours as needed for severe pain. 07/17/16  Yes [name], MD  insulin aspart (NOVOLOG) 100 UNIT/ML FlexPen Inject 18 Units into the skin 3 (three) times daily with meals. 06/12/16  Yes [name], MD  Insulin Detemir (LEVEMIR) 100 UNIT/ML Pen USE 37 UNITS TWICE DAILY 08/06/16  Yes [name], MD  Insulin Syringe-Needle U-100 31G X 15/64" 0.5 ML MISC Use to inject insulin up to 4 times a day 04/17/16  Yes [name], MD  Lancets Misc. (ACCU-CHEK FASTCLIX LANCET) KIT Check your blood 4  times a day dx code 250.00 insulin requiring 01/19/13  Yes [name], MD  metoprolol succinate (TOPROL-XL) 25 MG 24 hr tablet Take 1 tablet (25 mg total) by mouth daily. 06/11/16  Yes [name], MD  nystatin cream (MYCOSTATIN) Apply 1 application topically 2 (two) times daily. 07/10/16  Yes [provider]  omeprazole (PRILOSEC) 40 MG capsule Take 1 capsule (40 mg total) by mouth 2 (two) times daily. 07/17/16  Yes [name], MD  [facility] VERIO test strip Check blood sugar up to 4 times a day 02/28/15  Yes [name], MD  OXYGEN Inhale 2 L into the lungs continuous.   Yes [provider]  potassium chloride SA (K-DUR,KLOR-CON) 20 MEQ tablet Take 1 tablet (20 mEq total) by mouth 2 (two) times daily. 08/25/16  Yes [name], MD  promethazine (PHENERGAN) 12.5 MG tablet Take 1 tablet (12.5 mg total) by mouth every 8 (eight) hours as needed for nausea or vomiting. 07/13/15  Yes [name], DO  rosuvastatin (CRESTOR) 20 MG tablet Take 1 tablet (20 mg total) by mouth at bedtime. 08/14/16  Yes [name], MD  tiotropium (SPIRIVA HANDIHALER) 18 MCG inhalation capsule Place 1 capsule (18 mcg total) into inhaler and inhale daily. 08/14/16  Yes [name], MD  warfarin (COUMADIN) 2.5 MG tablet TAKE 1 TABLET (2.5 MG TOTAL) BY MOUTH DAILY AT 6 PM EXCEPT ON MONDAYS. 09/11/16  Yes [name], MD   Past Medical History:  Diagnosis Date  . Adenomatous polyps 05/14/2011   Colonoscopy (05/2011): 4 mm adenomatous polyp excised endoscopically Colonoscopy (02/2002): Adenomatous polyp excised endoscopically   . Allergic rhinitis 06/01/2012  . Anemia of chronic disease 01/01/2013  . Anxiety 07/24/2010  . Aortic atherosclerosis ([place]) 10/19/2014   Seen on CT scan, currently asymptomatic  . 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 Asymptomatic cholelithiasis 09/25/2015   Seen on CT scan  08/2015  . Bilateral cataracts 08/04/2012   Visually insignificant   . Carotid artery stenosis    s/p right endarterectomy (06/2010) Carotid US (07/2010):  Left: Moderate-to-severe (60-79%) calcific and non-calcific plaque origin and proximal ICA and ECA   . Chronic congestive heart failure with left ventricular diastolic dysfunction ([place]) 10/21/2010  . Chronic constipation 02/03/2011  . Chronic daily headache 01/16/2014  . Chronic low back pain 10/06/2012  . Chronic venous insufficiency 08/04/2012  . Clear cell renal cell carcinoma ([place]) 07/21/2011   s/p cryoablation of left RCC in 09/2011 by [name]. Followed by [name]  [facility]) .    Marland Kitchen COPD (chronic obstructive pulmonary disease) with emphysema ([facility])    PFTs 02/2012: FEV1 0.92 (40%), ratio 69, 27% increase in FEV1 with BD, TLC 91%, severe airtrapping, DLCO49% On chronic home O2. Pulmonary rehab referral 05/2012   .  Depression 11/19/2005  . Esophageal stenosis 08/08/2015   Mild, benign-appearing on EGD 06/29/2015  . Fibromyalgia 08/29/2010  . Gastroesophageal reflux disease   . History of blood transfusion    "several times"   . Hyperlipidemia LDL goal < 100 11/20/2005  . Internal hemorrhoids 08/04/2012  . Mitral stenosis    s/p Mitral valve replacement with a 27-mm pericardial porcine valve (Medtronic Mosaic valve, serial #[id number] on 09/20/10, Dr. Prescott Gum)   . Moderate to severe pulmonary hypertension ([place])    2014 TEE w PA peak pressure 46 mmHg, s/p MV replacement   . Obesity (BMI 30.0-34.9) 10/23/2011  . Obstructive sleep apnea    Nocturnal polysomnography (06/2009): Moderate sleep apnea/ hypopnea syndrome , AHI 17.8 per hour with nonpositional hypopneas. CPAP titration to 12 CWP, AHI 2.4 per hour. On nocturnal CPAP via a small resMed Quattro full-face mask with heated humidifier.   . Osteoporosis    DEXA (12/09/2011): L-spine T -3.7, left hip T -1.4 DEXA (12/2004): L-spine T -2.6, left hip -0.1   . Paroxysmal atrial  fibrillation ([place]) 10/22/2010   s/p Left atrial maze procedure for paroxysmal atrial fibrillation on 09/20/2010 by Dr Prescott Gum.  Subsequent splenic infarct, decision was made to re-anticoagulate with coumadin, likely life-long as this is the most likely cause of the splenic infarct.   . Pulmonary hypertension due to chronic obstructive pulmonary disease ([place]) 04/25/2016  . Right nephrolithiasis 09/06/2014   5 mm non-obstructing calculus seen on CT scan 09/05/2014   . Seborrheic keratosis 09/28/2015  . Shortness of breath dyspnea   . Tobacco abuse 07/28/2012  . Type 2 diabetes mellitus with diabetic neuropathy [facility])    Social History   Social History  . Marital status: Divorced    Spouse name: N/A  . Number of children: N/A  . Years of education: N/A   Social History Main Topics  . Smoking status: Current Every Day Smoker    Packs/day: 1.00    Years: 53.00    Types: Cigarettes    Start date: 11/01/1962  . Smokeless tobacco: Never Used     Comment: 1/2 to 1 pack per day  . Alcohol use No  . Drug use: No  . Sexual activity: No   Other Topics Concern  . Not on file   Social History Narrative   Lives [place] in [place] with 60 pound chow   Worked at [employer] for 18 years   No car   Family History  Problem Relation Age of Onset  . Peptic Ulcer Disease Father   . Heart attack Father 60       Died of MI at age 91  . Heart attack Brother 84       Died of MI at age 4  . Obesity Brother   . Pneumonia Mother   . Healthy Sister   . Lupus Daughter   . Obsessive Compulsive Disorder Daughter     ASSESSMENT Recent Results: The most recent result is correlated with 15 mg per week: Lab Results  Component Value Date   INR 2.2 10/06/2016   INR 2.10 09/08/2016   INR 2.50 08/04/2016    Anticoagulation Dosing: INR as of 10/06/2016 and Previous Warfarin Dosing Information    INR Dt INR Goal [name] Sun Mon Tue Wed Thu Fri Sat   10/06/2016 2.2 1.5-2.5 15 mg  2.5 mg 0 mg 2.5 mg 2.5 mg 2.5 mg 2.5 mg 2.5 mg    Previous description  Take ONE-HALF (1/2) tablet of your CURRENT 29m peach colored warfarin tablet by mouth, once-daily at 6PM all days of week, EXCEPT on Mondays. Do NOT take any warfarin on Mondays of each week. When you have expended all tablets of your 538mpeach colored warfarin tablets and are ready to transition to use of your 2.90m27mreen colored warfarin tablets that Dr. KliEppie Gibsons provided you a prescription for--please let me know by calling:  [phone] that your tablet strength can be changed in the www.doseresponse.com database.    Anticoagulation Warfarin Dose Instructions as of 10/06/2016      Total Sun Mon Tue Wed Thu Fri Sat   New Dose 15 mg 2.5 mg 0 mg 2.5 mg 2.5 mg 2.5 mg 2.5 mg 2.5 mg     (5 mg x 0.5)  -  (5 mg x 0.5)  (5 mg x 0.5)  (5 mg x 0.5)  (5 mg x 0.5)  (5 mg x 0.5)                         Description   Take ONE-HALF (1/2) tablet of your CURRENT 90mg64mach colored warfarin tablet by mouth, once-daily at 6PM all days of week, EXCEPT on Mondays. Do NOT take any warfarin on Mondays of each week. When you have expended all tablets of your 90mg 49mch colored warfarin tablets and are ready to transition to use of your 2.90mg g73mn colored warfarin tablets that Dr. Klima Eppie Gibsonrovided you a prescription for--please let me know by calling:  [phone] your tablet strength can be changed in the www.doseresponse.com database.      INR today: Therapeutic  PLAN Weekly dose was unchanged  Patient Instructions  Patient instructed to take medications as defined in the Anti-coagulation Track section of this encounter.  Patient instructed to take today's dose.  Patient instructed to take ONE-HALF (1/2) tablet of your CURRENT 90mg pe47m colored warfarin tablet by mouth, once-daily at 6PM all days of week, EXCEPT on Mondays. Do NOT take any warfarin on Mondays of each week. When you have expended all tablets of your  90mg pea57mcolored warfarin tablets and are ready to transition to use of your 2.90mg gree67molored warfarin tablets that Dr. Klima hasEppie Gibsonided you a prescription for--please let me know by calling:  [phone] tablet strength can be changed in the www.doseresponse.com database.  Patient verbalized understanding of these instructions.    Patient advised to contact clinic or seek medical attention if signs/symptoms of bleeding or thromboembolism occur.  Patient verbalized understanding by repeating back information and was advised to contact me if further medication-related questions arise. Patient was also provided an information handout.  Follow-up Return in about 3 weeks (around 10/27/2016) for Follow up INR at 0930.  Jonathan Carnella GuadalajaraPGY1 Acute Care Pharmacy Resident  15 minutes spent face-to-face with the patient during the encounter. 50% of time spent on education. 50% of time was spent on finger stick point of care INR sample collection processing results interpretation and documentation in EPIC/CHL and [email or web address]

## 2016-10-06 NOTE — Progress Notes (Signed)
Diabetes Self-Management Education  Visit Type:  Follow-up  Appt. Start Time: 1015 Appt. End Time: 1055  10/06/2016  [name], identified by name and date of birth, is a 66 y.o. female with a diagnosis of Diabetes:  Marland Kitchen  Type 2 diabetes ASSESSMENT  Vitals were deferred today. CBG was 176, patient was observed giving her am dose of 37 units levemir on her left arm. She is here for knots left under skin after injections. Can palpate small lumps, not reddened on abdomen. [name] was unable to pinch up on her arm, but asked ot pinch up when possible and count to 10 prior to removing needle. She is also going to trail 23m longer needle and brand name to see if this helps.   She is having CT scan tomorrow so cannot have FErmaprofessional placed at this time. She will le tus know when she is ready.   She was proved with a new accu fastclick lancing device and a new One touch verio flex meter sample and taught how to use program to self manage blood sugars.   She is running short on Novolog and request her prescription be updated to what she is taking, 23-28 ( 18 units with correction) 3x/day totaling 84 units total Novolog per day possible.      Diabetes Self-Management Education - 10/06/16 1200      Health Coping   How would you rate your overall health? Fair     Psychosocial Assessment   Patient Belief/Attitude about Diabetes Motivated to manage diabetes   Self-care barriers Lack of transportation;Lack of material resources   Self-management support Doctor's office;CDE visits   Patient Concerns Medication;Monitoring;Problem Solving   Special Needs None   Preferred Learning Style No preference indicated   Learning Readiness Ready     Pre-Education Assessment   Patient understands using medications safely. Needs Review   Patient understands monitoring blood glucose, interpreting and using results Needs Review     Complications   Number of hypoglycemic episodes per  month 0     Subsequent Visit   Since your last visit have you continued or begun to take your medications as prescribed? Yes   Since your last visit have you had your blood pressure checked? No   Since your last visit have you experienced any weight changes? No change   Since your last visit, are you checking your blood glucose at least once a day? Yes      Learning Objective:  Patient will have a greater understanding of diabetes self-management. Patient education plan is to attend individual and/or group sessions per assessed needs and concerns. My plan to support myself in continuing these changes to care for my diabetes is to attend or contact:   Diabetes Support Groups ?Type 1 diabetes support group- for insulin pump information as needed  [address] Hospitalconference room [phone] local support resources -doctor's office, CDE, Dietitian, pharmacist, church Journals ? Diabetes Forecast- [phone] [email or web address]? Diabetes Self-Management- [phone] [email or web address] ? Other:   Let your educator know if you need help accessing the websites.   Plan:   There are no Patient Instructions on file for this visit.   Expected Outcomes:     Education material provided: Support group flyer  If problems or questions, patient to contact team via:  Phone  Future DSME appointment: -  as needed Plyler, DButch Penny RProctor8/27/2018 12:13 PM.

## 2016-10-06 NOTE — Telephone Encounter (Signed)
Spoke w/ pt today, had called last week and lm for rtc

## 2016-10-06 NOTE — Patient Instructions (Signed)
Patient instructed to take medications as defined in the Anti-coagulation Track section of this encounter.  Patient instructed to take today's dose.  Patient instructed to take ONE-HALF (1/2) tablet of your CURRENT 69m peach colored warfarin tablet by mouth, once-daily at 6PM all days of week, EXCEPT on Mondays. Do NOT take any warfarin on Mondays of each week. When you have expended all tablets of your 5964mpeach colored warfarin tablets and are ready to transition to use of your 2.64m49mreen colored warfarin tablets that Dr. KliEppie Gibsons provided you a prescription for--please let me know by calling:  [phone] that your tablet strength can be changed in the www.doseresponse.com database.  Patient verbalized understanding of these instructions.

## 2016-10-07 ENCOUNTER — Ambulatory Visit
Admission: RE | Admit: 2016-10-07 | Discharge: 2016-10-07 | Disposition: A | Discharge status: Home / Self Care | Payer: PPO | Source: Ambulatory Visit | Attending: Interventional Radiology | Admitting: Interventional Radiology

## 2016-10-07 ENCOUNTER — Ambulatory Visit (HOSPITAL_COMMUNITY)
Admission: RE | Admit: 2016-10-07 | Discharge: 2016-10-07 | Disposition: A | Discharge status: Home / Self Care | Payer: PPO | Source: Ambulatory Visit | Attending: Interventional Radiology | Admitting: Interventional Radiology

## 2016-10-07 DIAGNOSIS — N200 Calculus of kidney: Secondary | ICD-10-CM | POA: Insufficient documentation

## 2016-10-07 DIAGNOSIS — J439 Emphysema, unspecified: Secondary | ICD-10-CM | POA: Diagnosis not present

## 2016-10-07 DIAGNOSIS — C220 Liver cell carcinoma: Secondary | ICD-10-CM | POA: Diagnosis not present

## 2016-10-07 DIAGNOSIS — I700 Atherosclerosis of aorta: Secondary | ICD-10-CM | POA: Insufficient documentation

## 2016-10-07 DIAGNOSIS — C642 Malignant neoplasm of left kidney, except renal pelvis: Secondary | ICD-10-CM

## 2016-10-07 DIAGNOSIS — N281 Cyst of kidney, acquired: Secondary | ICD-10-CM | POA: Insufficient documentation

## 2016-10-07 DIAGNOSIS — J432 Centrilobular emphysema: Secondary | ICD-10-CM | POA: Diagnosis not present

## 2016-10-07 DIAGNOSIS — Z85528 Personal history of other malignant neoplasm of kidney: Secondary | ICD-10-CM | POA: Diagnosis not present

## 2016-10-07 DIAGNOSIS — M47814 Spondylosis without myelopathy or radiculopathy, thoracic region: Secondary | ICD-10-CM | POA: Diagnosis not present

## 2016-10-07 DIAGNOSIS — K8020 Calculus of gallbladder without cholecystitis without obstruction: Secondary | ICD-10-CM | POA: Diagnosis not present

## 2016-10-07 HISTORY — PX: IR RADIOLOGIST EVAL & MGMT: IMG5224

## 2016-10-07 LAB — POCT I-STAT CREATININE: Creatinine, Ser: 1 mg/dL (ref 0.44–1.00)

## 2016-10-07 MED ORDER — IOPAMIDOL (ISOVUE-300) INJECTION 61%
100.0000 mL | Freq: Once | INTRAVENOUS | Status: AC | PRN
  Administered 2016-10-07: 100 mL via INTRAVENOUS

## 2016-10-07 MED ORDER — IOPAMIDOL (ISOVUE-300) INJECTION 61%
INTRAVENOUS | Status: AC
  Filled 2016-10-07: qty 100

## 2016-10-07 NOTE — Progress Notes (Signed)
Chief Complaint: Status post percutaneous cryoablation of a left renal clear cell carcinoma on 09/19/2011.  History of Present Illness: [name] is a 66 y.o. female now 5 years status post cryoablation of a left posterior lower pole clear cell renal carcinoma. She has done well after the procedure and is asymptomatic with respect to the kidney and urinary system.  Past Medical History:  Diagnosis Date  . Adenomatous polyps 05/14/2011   Colonoscopy (05/2011): 4 mm adenomatous polyp excised endoscopically Colonoscopy (02/2002): Adenomatous polyp excised endoscopically   . Allergic rhinitis 06/01/2012  . Anemia of chronic disease 01/01/2013  . Anxiety 07/24/2010  . Aortic atherosclerosis ([place]) 10/19/2014   Seen on CT scan, currently asymptomatic  . 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 Asymptomatic cholelithiasis 09/25/2015   Seen on CT scan 08/2015  . Bilateral cataracts 08/04/2012   Visually insignificant   . Carotid artery stenosis    s/p right endarterectomy (06/2010) Carotid US (07/2010):  Left: Moderate-to-severe (60-79%) calcific and non-calcific plaque origin and proximal ICA and ECA   . Chronic congestive heart failure with left ventricular diastolic dysfunction ([place]) 10/21/2010  . Chronic constipation 02/03/2011  . Chronic daily headache 01/16/2014  . Chronic low back pain 10/06/2012  . Chronic venous insufficiency 08/04/2012  . Clear cell renal cell carcinoma ([place]) 07/21/2011   s/p cryoablation of left RCC in 09/2011 by [name]. Followed by [name]  [facility]) .    Marland Kitchen COPD (chronic obstructive pulmonary disease) with emphysema (HCC)    PFTs 02/2012: FEV1 0.92 (40%), ratio 69, 27% increase in FEV1 with BD, TLC 91%, severe airtrapping, DLCO49% On chronic home O2. Pulmonary rehab referral 05/2012   . Depression 11/19/2005  . Esophageal stenosis 08/08/2015   Mild,  benign-appearing on EGD 06/29/2015  . Fibromyalgia 08/29/2010  . Gastroesophageal reflux disease   . History of blood transfusion    "several times"   . Hyperlipidemia LDL goal < 100 11/20/2005  . Internal hemorrhoids 08/04/2012  . Mitral stenosis    s/p Mitral valve replacement with a 27-mm pericardial porcine valve (Medtronic Mosaic valve, serial #[id number] on 09/20/10, Dr. Prescott Gum)   . Moderate to severe pulmonary hypertension ([place])    2014 TEE w PA peak pressure 46 mmHg, s/p MV replacement   . Obesity (BMI 30.0-34.9) 10/23/2011  . Obstructive sleep apnea    Nocturnal polysomnography (06/2009): Moderate sleep apnea/ hypopnea syndrome , AHI 17.8 per hour with nonpositional hypopneas. CPAP titration to 12 CWP, AHI 2.4 per hour. On nocturnal CPAP via a small resMed Quattro full-face mask with heated humidifier.   . Osteoporosis    DEXA (12/09/2011): L-spine T -3.7, left hip T -1.4 DEXA (12/2004): L-spine T -2.6, left hip -0.1   . Paroxysmal atrial fibrillation ([place]) 10/22/2010   s/p Left atrial maze procedure for paroxysmal atrial fibrillation on 09/20/2010 by Dr Prescott Gum.  Subsequent splenic infarct, decision was made to re-anticoagulate with coumadin, likely life-long as this is the most likely cause of the splenic infarct.   . Pulmonary hypertension due to chronic obstructive pulmonary disease ([place]) 04/25/2016  . Right nephrolithiasis 09/06/2014   5 mm non-obstructing calculus seen on CT scan 09/05/2014   . Seborrheic keratosis 09/28/2015  . Shortness of breath dyspnea   . Tobacco abuse 07/28/2012  . Type 2 diabetes mellitus with diabetic neuropathy [facility])     Past Surgical History:  Procedure Laterality Date  . CARDIAC  VALVE REPLACEMENT  Aug. 2012   "mitral valve"  . CAROTID ENDARTERECTOMY Right 07/04/2010   by [name] for asymptomatic right carotid artery stenosis  . CHEST TUBE INSERTION  09/24/2010   Dr Prescott Gum  . COLONOSCOPY  05/12/2011   performed by Dr. Michail Sermon. Showing small  internal hemorrhoids, single tubular adenoma polyp  . CRYOABLATION Left 09/2011   by [name]. Followed by [name]  [facility]) .    Marland Kitchen DILATION AND CURETTAGE OF UTERUS    . ESOPHAGOGASTRODUODENOSCOPY  05/12/2011   performed by Dr. Michail Sermon. Negative for ulcerations, biopsy negative for evidence of celiac sprue  . ESOPHAGOGASTRODUODENOSCOPY N/A 06/29/2015   Procedure: ESOPHAGOGASTRODUODENOSCOPY (EGD);  Surgeon: Clarene Essex, MD;  Location: [facility] ENDOSCOPY;  Service: Endoscopy;  Laterality: N/A;  . ESOPHAGOGASTRODUODENOSCOPY N/A 03/29/2016   Procedure: ESOPHAGOGASTRODUODENOSCOPY (EGD);  Surgeon: Clarene Essex, MD;  Location: [facility] ENDOSCOPY;  Service: Endoscopy;  Laterality: N/A;  . FRACTURE SURGERY Right 2005   clavicle  . GIVENS CAPSULE STUDY N/A 06/30/2015   Procedure: GIVENS CAPSULE STUDY;  Surgeon: Clarene Essex, MD;  Location: [facility] ENDOSCOPY;  Service: Endoscopy;  Laterality: N/A;  . GIVENS CAPSULE STUDY N/A 06/29/2015   Procedure: GIVENS CAPSULE STUDY;  Surgeon: Clarene Essex, MD;  Location: [place];  Service: Endoscopy;  Laterality: N/A;  . Hysteroscopy with endometrial ablation  06/2001   for persistent post-menopausal bleeding // by S. [name], M.D.  . IR GENERIC HISTORICAL  08/23/2015   IR RADIOLOGIST EVAL & MGMT 08/23/2015 Aletta Edouard, MD [facility] INTERV RAD  . IR GENERIC HISTORICAL  04/09/2016   IR RADIOLOGIST EVAL & MGMT 04/09/2016 Aletta Edouard, MD [facility] INTERV RAD  . LEFT HEART CATH AND CORONARY ANGIOGRAPHY N/A 04/21/2016   Procedure: Left Heart Cath and Coronary Angiography;  Surgeon: Lorretta Harp, MD;  Location: [place] CV LAB;  Service: Cardiovascular;  Laterality: N/A;  . LIPOMA EXCISION  08/2005   occipital lipoma 1.5cm - by Dr. [name]  . MAZE Left 09/20/10   for paroxysmal atrial fibrillation (Dr. Prescott Gum)  . MITRAL VALVE REPLACEMENT  09/20/10    with a 27-mm pericardial porcine valve (Medtronic Mosaic valve, serial #[id number]). 09/20/10, Dr Prescott Gum  . ORIF  CLAVICLE FRACTURE  01/2004   by Thana Farr. [name], M.D for Right clavicle nonunion.  Marland Kitchen RIGHT HEART CATH N/A 04/23/2016   Procedure: Right Heart Cath;  Surgeon: [name], MD;  Location: [place] CV LAB;  Service: Cardiovascular;  Laterality: N/A;  . TONSILLECTOMY    . TUBAL LIGATION      Allergies: Lorazepam; Morphine and related; Oxycontin [oxycodone]; and Tramadol hcl  Medications: Prior to Admission medications   Medication Sig Start Date End Date Taking? Authorizing Provider  albuterol (PROVENTIL HFA;VENTOLIN HFA) 108 (90 Base) MCG/ACT inhaler Inhale 2 puffs into the lungs every 6 (six) hours as needed for wheezing or shortness of breath.   Yes [provider]  ALPRAZolam (XANAX) 1 MG tablet TAKE 1/2 TO 1 TABLET BY MOUTH TWICE DAILY AS NEEDED FOR ANXIETY 07/17/16  Yes [name], MD  Blood Glucose Monitoring Suppl (ONETOUCH VERIO) W/DEVICE KIT 1 each by Does not apply route 4 (four) times daily. 07/25/14  Yes [name], MD  chlorpheniramine (CHLOR-TRIMETON) 4 MG tablet Take 1 tablet (4 mg total) by mouth 3 (three) times daily as needed for allergies. 10/06/14  Yes [name], MD  cyclobenzaprine (FLEXERIL) 5 MG tablet Take 1 tablet (5 mg total) by mouth 3 (three) times daily as needed  for muscle spasms. 01/11/16  Yes [name], MD  EASY TOUCH PEN NEEDLES 31G X 5 MM MISC USE TO INJECT INSULIN 5 TIMES DAILY. 07/17/16  Yes [name], MD  ferrous sulfate 325 (65 FE) MG tablet Take 325 mg by mouth 2 (two) times daily with a meal.   Yes [provider]  fluconazole (DIFLUCAN) 150 MG tablet Take 1 tablet (150 mg total) by mouth once as needed. 05/16/16  Yes [name], MD  FLUoxetine 60 MG TABS Take 60 mg by mouth daily. 07/30/16  Yes [name], MD  fluticasone (FLONASE) 50 MCG/ACT nasal spray Place 2 sprays into both nostrils daily. 02/08/15  Yes [name], MD  Fluticasone-Salmeterol (ADVAIR DISKUS) 500-50 MCG/DOSE AEPB Inhale 1 puff into  the lungs 2 (two) times daily. 12/14/15  Yes [name], MD  furosemide (LASIX) 80 MG tablet Take 1 tab in AM and 1/2 tab at 3 pm 08/25/16  Yes [name], MD  gabapentin (NEURONTIN) 300 MG capsule Take 2 capsules (600 mg total) by mouth 3 (three) times daily. 11/16/15  Yes [name], MD  HYDROcodone-acetaminophen (NORCO/VICODIN) 5-325 MG tablet Take 1-2 tablets by mouth every 6 (six) hours as needed for severe pain. 07/17/16  Yes [name], MD  insulin aspart (NOVOLOG) 100 UNIT/ML FlexPen Inject 18 Units into the skin 3 (three) times daily with meals. 06/12/16  Yes [name], MD  Insulin Detemir (LEVEMIR) 100 UNIT/ML Pen USE 37 UNITS TWICE DAILY 08/06/16  Yes [name], MD  Insulin Syringe-Needle U-100 31G X 15/64" 0.5 ML MISC Use to inject insulin up to 4 times a day 04/17/16  Yes [name], MD  Lancets Misc. (ACCU-CHEK FASTCLIX LANCET) KIT Check your blood 4 times a day dx code 250.00 insulin requiring 01/19/13  Yes [name], MD  metoprolol succinate (TOPROL-XL) 25 MG 24 hr tablet Take 1 tablet (25 mg total) by mouth daily. 06/11/16  Yes [name], MD  nystatin cream (MYCOSTATIN) Apply 1 application topically 2 (two) times daily. 07/10/16  Yes [provider]  omeprazole (PRILOSEC) 40 MG capsule Take 1 capsule (40 mg total) by mouth 2 (two) times daily. 07/17/16  Yes [name], MD  [facility] VERIO test strip Check blood sugar up to 4 times a day 02/28/15  Yes [name], MD  OXYGEN Inhale 2 L into the lungs continuous.   Yes [provider]  potassium chloride SA (K-DUR,KLOR-CON) 20 MEQ tablet Take 1 tablet (20 mEq total) by mouth 2 (two) times daily. 08/25/16  Yes [name], MD  promethazine (PHENERGAN) 12.5 MG tablet Take 1 tablet (12.5 mg total) by mouth every 8 (eight) hours as needed for nausea or vomiting. 07/13/15  Yes [name], DO  rosuvastatin (CRESTOR) 20 MG tablet Take 1 tablet (20 mg total) by mouth at  bedtime. 08/14/16  Yes [name], MD  tiotropium (SPIRIVA HANDIHALER) 18 MCG inhalation capsule Place 1 capsule (18 mcg total) into inhaler and inhale daily. 08/14/16  Yes [name], MD  warfarin (COUMADIN) 2.5 MG tablet TAKE 1 TABLET (2.5 MG TOTAL) BY MOUTH DAILY AT 6 PM EXCEPT ON MONDAYS. 09/11/16  Yes [name], MD     Family History  Problem Relation Age of Onset  . Peptic Ulcer Disease Father   . Heart attack Father 92       Died of MI at age 68  . Heart attack Brother 41       Died of MI at age 76  .  Obesity Brother   . Pneumonia Mother   . Healthy Sister   . Lupus Daughter   . Obsessive Compulsive Disorder Daughter     Social History   Social History  . Marital status: Divorced    Spouse name: N/A  . Number of children: N/A  . Years of education: N/A   Social History Main Topics  . Smoking status: Current Every Day Smoker    Packs/day: 1.00    Years: 53.00    Types: Cigarettes    Start date: 11/01/1962  . Smokeless tobacco: Never Used     Comment: 1/2 to 1 pack per day  . Alcohol use No  . Drug use: No  . Sexual activity: No   Other Topics Concern  . None   Social History Narrative   Lives [place] in [place] with 60 pound chow   Worked at [employer] for 18 years   No car    ECOG Status: 0 - Asymptomatic  Review of Systems: A 12 point ROS discussed and pertinent positives are indicated in the HPI above.  All other systems are negative.  Review of Systems  Constitutional: Negative.   Respiratory: Negative.   Cardiovascular: Negative.   Gastrointestinal: Negative.   Genitourinary: Negative.   Musculoskeletal: Negative.   Neurological: Negative.     Vital Signs: BP 128/63   Pulse 74   Temp 97.8 F (36.6 C)   Resp (!) 22   SpO2 96% Comment: O2 @ 1L/min (uses 2.5L at home via CPAP)  Physical Exam  Constitutional: She is oriented to person, place, and time. No distress.  Abdominal: Soft. She exhibits no  distension. There is no tenderness. There is no rebound and no guarding.  Neurological: She is alert and oriented to person, place, and time.  Skin: She is not diaphoretic.  Vitals reviewed.   Imaging: Ct Abdomen W Wo Contrast  Result Date: 10/07/2016 CLINICAL DATA:  Status post cryoablation of lower left renal cell carcinoma 09/19/2011, presenting for 5 year follow-up. EXAM: CT ABDOMEN WITHOUT AND WITH CONTRAST TECHNIQUE: Multidetector CT imaging of the abdomen was performed following the standard protocol before and following the bolus administration of intravenous contrast. CONTRAST:  127m ISOVUE-300 IOPAMIDOL (ISOVUE-300) INJECTION 61% COMPARISON:  03/28/2016 CT abdomen. FINDINGS: Lower chest: Centrilobular emphysema with no acute abnormality at the lung bases. Hepatobiliary: Normal liver with no liver mass. Cholelithiasis, with no gallbladder wall thickening, gallbladder distention or pericholecystic fluid. No biliary ductal dilatation. Pancreas: Normal, with no mass or duct dilation. Spleen: Normal size. No mass. Adrenals/Urinary Tract: Normal adrenals. Nonobstructing 5 mm lower right renal stone. No additional renal stones. No hydronephrosis. Ablation defect in the posterior lower left kidney measures 3.3 x 3.1 cm (series 2/image 50), previously 3.3 x 3.2 cm, stable. There is heterogeneous fat and soft tissue density with scattered calcifications within the ablation defect, unchanged. There is a small eccentric 1.0 cm soft tissue density nodular focus at the left posterior margin of the ablation defect demonstrating low-level delayed enhancement (series 11/image 40), unchanged back to 08/23/2015, compatible with fibrosis. No new solid enhancing component at the ablation site. Two simple right renal cysts, largest 1.3 cm in the lateral upper right kidney. Subcentimeter hypodense renal cortical lesion in the medial upper left kidney, too small to characterize, stable. Heterogeneous hypodense 0.7 cm  renal cortical lesion in the lateral upper left kidney (series 11/image 30), too small to accurately characterize, previously 0.5 cm on 03/28/2016 and not definitely seen on  08/23/2015. Stomach/Bowel: Grossly normal stomach. Visualized small and large bowel is normal caliber, with no bowel wall thickening. Vascular/Lymphatic: Atherosclerotic nonaneurysmal abdominal aorta. Patent portal, splenic, hepatic and renal veins. No pathologically enlarged lymph nodes in the abdomen. Other: No pneumoperitoneum, ascites or focal fluid collection. Musculoskeletal: No aggressive appearing focal osseous lesions. Mild thoracolumbar spondylosis. IMPRESSION: 1. Stable ablation defect in the posterior lower left kidney with no findings suspicious for local tumor recurrence. Stable nodular focus of suspected benign scar tissue at the posterior margin of the ablation defect, stable since 08/23/2015. 2. No evidence of metastatic disease in the abdomen. 3. Tiny heterogeneous 0.7 cm renal cortical lesion in the lateral upper left kidney with minimal interval growth, too small to characterize. This lesion warrants attention on follow-up MRI (preferred) or CT abdomen without and with IV contrast in 6 months. 4. Additional chronic findings include Aortic Atherosclerosis (ICD10-I70.0) and Emphysema (ICD10-J43.9), cholelithiasis and nonobstructing right renal stone. Electronically Signed   By: Ilona Sorrel M.D.   On: 10/07/2016 10:11    Labs:  CBC:  Recent Labs  04/29/16 05/09/16 1209 06/13/16 1459 07/17/16 1054  WBC 6.5 5.6 7.5 6.7  HGB 10.2* 10.7* 10.9* 11.5  HCT 33* 34.5* 34.8* 37.2  PLT 184 225 235 205    COAGS:  Recent Labs  06/30/16 1000 08/04/16 1047 09/08/16 1122 10/06/16 1134  INR 2.10 2.50 2.10 2.2    BMP:  Recent Labs  06/13/16 1459 06/24/16 1340 07/08/16 0858 08/25/16 1045 10/07/16 0855  NA 137 133* 138 134*  --   K 4.2 4.0 4.0 4.4  --   CL 101 97* 101 99*  --   CO2 _0 --     GLUCOSE 190* 245* 210* 183*  --   BUN _1 --   CALCIUM 9.0 8.9 9.1 8.8*  --   CREATININE 0.88 1.00 1.00 1.07* 1.00  GFRNONAA >60 57* 57* 52*  --   GFRAA >60 >60 >60 >60  --     LIVER FUNCTION TESTS:  Recent Labs  03/27/16 1645  BILITOT 0.3  AST 17  ALT 12*  ALKPHOS 41  PROT 6.2*  ALBUMIN 3.3*    Assessment and Plan:  I met with Mrs. Barkan and her sister. We reviewed a follow-up CT performed earlier today which reveals a stable ablation defect including stable nodular scar tissue along the posterior margin of prior ablation which does not show enhancement. The current study demonstrates suggestion of a potential small, subtle new renal lesion at the lateral upper pole cortex of the left kidney measuring roughly 7 mm. This is quite subtle on the delayed phase of imaging. Follow-up MRI versus CT was recommended in 6 months. Mrs. Thoennes would rather wait about a year to follow this finding. We compromised on a follow-up MRI in 9 months. Renal function is normal and will be rechecked prior to performing MRI with and without contrast.   Electronically Signed: YAMAGATA,GLENN T 10/07/2016, 3:18 PM   I spent a total of 15 Minutes in face to face in clinical consultation, greater than 50% of which was counseling/coordinating care post ablation of a left renal carcinoma.

## 2016-10-07 NOTE — Progress Notes (Signed)
INTERNAL MEDICINE TEACHING ATTENDING ADDENDUM - Nischal Narendra M.D  Duration- indefinite, Indication- pAfib, INR- therapeutic. Agree with pharmacy recommendations as outlined in their note.      

## 2016-10-08 ENCOUNTER — Other Ambulatory Visit: Payer: Self-pay | Admitting: Internal Medicine

## 2016-10-08 ENCOUNTER — Telehealth: Payer: Self-pay

## 2016-10-08 DIAGNOSIS — J439 Emphysema, unspecified: Secondary | ICD-10-CM

## 2016-10-08 DIAGNOSIS — E1140 Type 2 diabetes mellitus with diabetic neuropathy, unspecified: Secondary | ICD-10-CM

## 2016-10-08 DIAGNOSIS — Z794 Long term (current) use of insulin: Secondary | ICD-10-CM

## 2016-10-08 NOTE — Telephone Encounter (Signed)
Would like [name] to call back.

## 2016-10-08 NOTE — Telephone Encounter (Signed)
rtc to pt, she states she is worried: 1) continues to have vaginal yeast problems 2) especially since her dog passed she has noted that she is more withdrawn, less interested in tv, computer games, social media, she feels little or no interest/ pleasure in most things She states she does not want to harm self or others.  She was reassured and encouraged to speak to dr Eppie Gibson at her next appt or if she feels she needs more help, [facility] is available. She states she feels better talking to someone she trusts, I thanked her but suggested someone professionally may help her better understand the depression.

## 2016-10-10 ENCOUNTER — Telehealth: Payer: Self-pay | Admitting: *Deleted

## 2016-10-10 NOTE — Telephone Encounter (Addendum)
Patient calling again saying heartburn is not better, took mylanta 1 hr ago. No other symptoms. Wants to know if there's something else she can try. Advised if any other problems develop, or if she becomes more concerned for any reason to go to the ED/Urgent care. Also to call us next week if she feels she needs to be seen sooner than scheduled appt on 9/14. Verbalized understanding.

## 2016-10-10 NOTE — Telephone Encounter (Signed)
I called [name] and asked how she was doing since her Chow died.  She remains sad about his passing.  He was cremated and she pets his box every time she enters the living room, but relates it is not the same, and she will occasionally find herself looking for him out of habit.  Apparently he had a tumor on the liver and her vet recommended he be put to sleep.  This caught her by surprise and she simply did not have time to prepare for it.  She is not ready for a new dog at this time and really wonders if she could ever handle a new dog given her underlying health.  We agreed to discuss her depression and the Prozac she is on when she sees me in two weeks.

## 2016-10-10 NOTE — Telephone Encounter (Signed)
Patient complaining of heartburn, has taken prilosec this am, just took 2 chewable tums 30 mins ago. HR 65, denies chest pain or dizziness. Only ate a cereal bar earlier today. Advised to give med some time to work & call back (in 74 mins-1 hour) if symptoms worsen or more bothersome. pls advise!

## 2016-10-10 NOTE — Telephone Encounter (Signed)
I called [name] to discuss her symptoms.  She states that since this morning she has had "heartburn".  It is a constant burning sensation that has not waxed or waned.  It is not associated with shortness of breath beyond her baseline, diaphoresis, or dizziness.  There is some associated nausea.  It has not responded to Prilosec this AM, Maalox, and Mylanta.  She states she has had similar episodes about every 6 months.  In the past she has had to go to the ED and it will respond to a "GI cocktail".  I told her that I could not tell her what she had, whether it was serious or not, but that I was concerned given the severity and the fact that it has not responded to some basic measures which should address heartburn.  I recommended that she not sit on this for long and consider going to the ED for evaluation.  She too is starting to worry about this and says she just might go to the ED.  I also have a scheduled appointment with her on 10/24/2016, but she can be seen in the [facility] sooner if necessary for a ED follow-up if she were to go.

## 2016-10-15 ENCOUNTER — Telehealth: Payer: Self-pay | Admitting: Dietician

## 2016-10-15 ENCOUNTER — Encounter: Payer: Self-pay | Admitting: Dietician

## 2016-10-15 DIAGNOSIS — E1140 Type 2 diabetes mellitus with diabetic neuropathy, unspecified: Secondary | ICD-10-CM

## 2016-10-15 DIAGNOSIS — Z794 Long term (current) use of insulin: Principal | ICD-10-CM

## 2016-10-15 NOTE — Telephone Encounter (Signed)
[name] called asking for an updated Novolog prescription that includes her current usage.   She is using 18-28 units Novolog (18 units plus correction) two-three times a day. This usage would require 25.56m per 30 days maximally. She thinks she'll have enough insulin to last until her upcoming visit on 10/24/16.

## 2016-10-16 ENCOUNTER — Telehealth: Payer: Self-pay

## 2016-10-16 MED ORDER — INSULIN ASPART 100 UNIT/ML FLEXPEN: 18.0000 [IU] | PEN_INJECTOR | Freq: Three times a day (TID) | SUBCUTANEOUS | 10 refills | Status: DC

## 2016-10-16 NOTE — Telephone Encounter (Signed)
Asking [name] to call back.

## 2016-10-20 NOTE — Telephone Encounter (Signed)
Spoke w/ pt Thursday pm, reassurance given

## 2016-10-21 ENCOUNTER — Telehealth: Payer: Self-pay | Admitting: Dietician

## 2016-10-21 DIAGNOSIS — Z794 Long term (current) use of insulin: Principal | ICD-10-CM

## 2016-10-21 DIAGNOSIS — E1140 Type 2 diabetes mellitus with diabetic neuropathy, unspecified: Secondary | ICD-10-CM

## 2016-10-21 MED ORDER — INSULIN PEN NEEDLE 31G X 6 MM MISC: 3 refills | Status: DC

## 2016-10-21 NOTE — Telephone Encounter (Signed)
1- Pen needles 6 mm BD work better for her, not getting knots with injections. [facility] only carries easy touch, a- try easy touch 55m if available, plan b- try another mail order company c- try retail pharmacy  Plan: request new order for 6 mm easy touch pen needles

## 2016-10-24 ENCOUNTER — Ambulatory Visit: Payer: PPO | Admitting: Dietician

## 2016-10-24 ENCOUNTER — Ambulatory Visit: Payer: PPO | Admitting: Internal Medicine

## 2016-10-27 ENCOUNTER — Telehealth: Payer: Self-pay

## 2016-10-27 ENCOUNTER — Ambulatory Visit: Payer: PPO

## 2016-10-27 ENCOUNTER — Other Ambulatory Visit: Payer: Self-pay | Admitting: Internal Medicine

## 2016-10-27 ENCOUNTER — Other Ambulatory Visit: Payer: Self-pay | Admitting: *Deleted

## 2016-10-27 DIAGNOSIS — G8929 Other chronic pain: Secondary | ICD-10-CM

## 2016-10-27 DIAGNOSIS — M545 Low back pain, unspecified: Secondary | ICD-10-CM

## 2016-10-27 DIAGNOSIS — B373 Candidiasis of vulva and vagina: Secondary | ICD-10-CM

## 2016-10-27 DIAGNOSIS — B3731 Acute candidiasis of vulva and vagina: Secondary | ICD-10-CM

## 2016-10-27 MED ORDER — HYDROCODONE-ACETAMINOPHEN 5-325 MG PO TABS: 1.0000 | ORAL_TABLET | Freq: Four times a day (QID) | ORAL | 0 refills | Status: DC | PRN

## 2016-10-27 NOTE — Telephone Encounter (Signed)
rtc to pt, she is concerned about several issues: 1) medications, states she was told by [facility]  That her pcp had recently declined to refill 4 meds, triage called [facility] and there is only 1 medicine that needs refills, nystatin cream, sent request for refill 2) INR, appt cancelled for today, she just doesn't feel "up to par" , would like to have it done this week, will discuss w/ dr's groce and kim, she would also like a flu shot at that time- just spoke to dr's groce and kim at the same time, will schedule her for next monday 3) Also wants to see donna at the time she comes, sending note to donna 4) Have her 3 scripts for pain med

## 2016-10-27 NOTE — Telephone Encounter (Signed)
Requesting to speak with Bonnita Nasuti. Would like a call back before 12 and after 4:00.

## 2016-10-28 MED ORDER — NYSTATIN 100000 UNIT/GM EX CREA: 1.0000 "application " | TOPICAL_CREAM | Freq: Two times a day (BID) | CUTANEOUS | 1 refills | Status: DC

## 2016-10-29 NOTE — Telephone Encounter (Signed)
Hydrocodone rxs ready for pick up ; pt called / informed.

## 2016-10-31 ENCOUNTER — Telehealth: Payer: Self-pay | Admitting: Internal Medicine

## 2016-10-31 DIAGNOSIS — Z794 Long term (current) use of insulin: Principal | ICD-10-CM

## 2016-10-31 DIAGNOSIS — E1140 Type 2 diabetes mellitus with diabetic neuropathy, unspecified: Secondary | ICD-10-CM

## 2016-10-31 NOTE — Telephone Encounter (Signed)
Patient would like for Bonnita Nasuti to call her back.

## 2016-10-31 NOTE — Telephone Encounter (Signed)
Lm for rtc 

## 2016-11-05 ENCOUNTER — Ambulatory Visit: Payer: PPO | Admitting: *Deleted

## 2016-11-05 ENCOUNTER — Ambulatory Visit (INDEPENDENT_AMBULATORY_CARE_PROVIDER_SITE_OTHER): Payer: PPO | Admitting: Pharmacist

## 2016-11-05 ENCOUNTER — Other Ambulatory Visit: Payer: Self-pay | Admitting: Internal Medicine

## 2016-11-05 ENCOUNTER — Ambulatory Visit: Payer: PPO | Admitting: Dietician

## 2016-11-05 DIAGNOSIS — Z7901 Long term (current) use of anticoagulants: Secondary | ICD-10-CM

## 2016-11-05 DIAGNOSIS — Z794 Long term (current) use of insulin: Principal | ICD-10-CM

## 2016-11-05 DIAGNOSIS — Z23 Encounter for immunization: Secondary | ICD-10-CM

## 2016-11-05 DIAGNOSIS — E1140 Type 2 diabetes mellitus with diabetic neuropathy, unspecified: Secondary | ICD-10-CM | POA: Diagnosis not present

## 2016-11-05 DIAGNOSIS — I480 Paroxysmal atrial fibrillation: Secondary | ICD-10-CM | POA: Diagnosis not present

## 2016-11-05 DIAGNOSIS — J439 Emphysema, unspecified: Secondary | ICD-10-CM

## 2016-11-05 LAB — POCT INR: INR: 2.5

## 2016-11-05 NOTE — Patient Instructions (Signed)
Patient educated about medication as defined in this encounter and verbalized understanding by repeating back instructions provided.

## 2016-11-05 NOTE — Progress Notes (Signed)
Patient was here for placement of CGM. CGM was placed by pharmacy. Patient to follow up in 1 week.

## 2016-11-05 NOTE — Progress Notes (Signed)
Anticoagulation Management [name] is a 65 y.o. female who reports to the clinic for monitoring of warfarin treatment and CGM placement.    Indication: atrial fibrillation  Duration: indefinite Supervising physician: Murriel Hopper  Anticoagulation Clinic Visit History: Patient does not report signs/symptoms of bleeding or thromboembolism  Other recent changes: No diet, medications, lifestyle changes endorsed to me by the patient.    Anticoagulation Episode Summary    Current INR goal:   1.5-2.5  TTR:   80.3 % (3.6 y)  Next INR check:   12/05/2016  INR from last check:   2.5 (11/05/2016)  Weekly max warfarin dose:     Target end date:     INR check location:   [facility]  Preferred lab:     Send INR reminders to:   [facility]   Indications   Paroxysmal atrial fibrillation ([place]) [I48.0] Long term current use of anticoagulant therapy [Z79.01]       Comments:         Anticoagulation Care Providers    Provider Role Specialty Phone number   Lelon Perla, MD  Cardiology [phone]     Allergies  Allergen Reactions  . Lorazepam Other (See Comments)    Patient's sister noted that ativan caused the patient to become extremely confused during hospitalization 09/2010; tolerates Xanax  . Morphine And Related Other (See Comments)    Injection site reaction  . Oxycontin [Oxycodone] Other (See Comments)    headache  . Tramadol Hcl Swelling    Ankle swelling   Medication Sig  albuterol (PROVENTIL HFA;VENTOLIN HFA) 108 (90 Base) MCG/ACT inhaler Inhale 2 puffs into the lungs every 6 (six) hours as needed for wheezing or shortness of breath.  ALPRAZolam (XANAX) 1 MG tablet TAKE 1/2 TO 1 TABLET BY MOUTH TWICE DAILY AS NEEDED FOR ANXIETY  Blood Glucose Monitoring Suppl (ONETOUCH VERIO) W/DEVICE KIT 1 each by Does not apply route 4 (four) times daily.  chlorpheniramine (CHLOR-TRIMETON) 4 MG tablet Take 1 tablet (4 mg total) by mouth 3 (three)  times daily as needed for allergies.  cyclobenzaprine (FLEXERIL) 5 MG tablet Take 1 tablet (5 mg total) by mouth 3 (three) times daily as needed for muscle spasms.  ferrous sulfate 325 (65 FE) MG tablet Take 325 mg by mouth 2 (two) times daily with a meal.  fluconazole (DIFLUCAN) 150 MG tablet Take 1 tablet (150 mg total) by mouth once as needed.  FLUoxetine 60 MG TABS Take 60 mg by mouth daily.  fluticasone (FLONASE) 50 MCG/ACT nasal spray Place 2 sprays into both nostrils daily.  Fluticasone-Salmeterol (ADVAIR DISKUS) 500-50 MCG/DOSE AEPB Inhale 1 puff into the lungs 2 (two) times daily.  furosemide (LASIX) 80 MG tablet Take 1 tab in AM and 1/2 tab at 3 pm  gabapentin (NEURONTIN) 300 MG capsule Take 2 capsules (600 mg total) by mouth 3 (three) times daily.  HYDROcodone-acetaminophen (NORCO/VICODIN) 5-325 MG tablet Take 1-2 tablets by mouth every 6 (six) hours as needed for severe pain.  insulin aspart (NOVOLOG) 100 UNIT/ML FlexPen Inject 18-28 Units into the skin 3 (three) times daily with meals.  Insulin Detemir (LEVEMIR) 100 UNIT/ML Pen USE 37 UNITS TWICE DAILY  Insulin Pen Needle 31G X 6 MM MISC Use to inject insulin 5 times daily  Insulin Syringe-Needle U-100 31G X 15/64" 0.5 ML MISC Use to inject insulin up to 4 times a day  Lancets Misc. (ACCU-CHEK FASTCLIX LANCET) KIT Check your blood 4 times a day dx  code 250.00 insulin requiring  metoprolol succinate (TOPROL-XL) 25 MG 24 hr tablet Take 1 tablet (25 mg total) by mouth daily.  nystatin cream (MYCOSTATIN) Apply 1 application topically 2 (two) times daily.  omeprazole (PRILOSEC) 40 MG capsule Take 1 capsule (40 mg total) by mouth 2 (two) times daily.  ONETOUCH VERIO test strip Check blood sugar up to 4 times a day  OXYGEN Inhale 2 L into the lungs continuous.  potassium chloride SA (K-DUR,KLOR-CON) 20 MEQ tablet Take 1 tablet (20 mEq total) by mouth 2 (two) times daily.  promethazine (PHENERGAN) 12.5 MG tablet Take 1 tablet (12.5 mg  total) by mouth every 8 (eight) hours as needed for nausea or vomiting.  rosuvastatin (CRESTOR) 20 MG tablet Take 1 tablet (20 mg total) by mouth at bedtime.  tiotropium (SPIRIVA HANDIHALER) 18 MCG inhalation capsule Place 1 capsule (18 mcg total) into inhaler and inhale daily.  warfarin (COUMADIN) 2.5 MG tablet TAKE 1 TABLET (2.5 MG TOTAL) BY MOUTH DAILY AT 6 PM EXCEPT ON MONDAYS.   Past Medical History:  Diagnosis Date  . Adenomatous polyps 05/14/2011   Colonoscopy (05/2011): 4 mm adenomatous polyp excised endoscopically Colonoscopy (02/2002): Adenomatous polyp excised endoscopically   . Allergic rhinitis 06/01/2012  . Anemia of chronic disease 01/01/2013  . Anxiety 07/24/2010  . Aortic atherosclerosis ([place]) 10/19/2014   Seen on CT scan, currently asymptomatic  . 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 Asymptomatic cholelithiasis 09/25/2015   Seen on CT scan 08/2015  . Bilateral cataracts 08/04/2012   Visually insignificant   . Carotid artery stenosis    s/p right endarterectomy (06/2010) Carotid US (07/2010):  Left: Moderate-to-severe (60-79%) calcific and non-calcific plaque origin and proximal ICA and ECA   . Chronic congestive heart failure with left ventricular diastolic dysfunction ([place]) 10/21/2010  . Chronic constipation 02/03/2011  . Chronic daily headache 01/16/2014  . Chronic low back pain 10/06/2012  . Chronic venous insufficiency 08/04/2012  . Clear cell renal cell carcinoma ([place]) 07/21/2011   s/p cryoablation of left RCC in 09/2011 by [name]. Followed by [name]  [facility]) .    Marland Kitchen COPD (chronic obstructive pulmonary disease) with emphysema (HCC)    PFTs 02/2012: FEV1 0.92 (40%), ratio 69, 27% increase in FEV1 with BD, TLC 91%, severe airtrapping, DLCO49% On chronic home O2. Pulmonary rehab referral 05/2012   . Depression 11/19/2005  . Esophageal stenosis 08/08/2015   Mild,  benign-appearing on EGD 06/29/2015  . Fibromyalgia 08/29/2010  . Gastroesophageal reflux disease   . History of blood transfusion    "several times"   . Hyperlipidemia LDL goal < 100 11/20/2005  . Internal hemorrhoids 08/04/2012  . Mitral stenosis    s/p Mitral valve replacement with a 27-mm pericardial porcine valve (Medtronic Mosaic valve, serial #[id number] on 09/20/10, Dr. Prescott Gum)   . Moderate to severe pulmonary hypertension ([place])    2014 TEE w PA peak pressure 46 mmHg, s/p MV replacement   . Obesity (BMI 30.0-34.9) 10/23/2011  . Obstructive sleep apnea    Nocturnal polysomnography (06/2009): Moderate sleep apnea/ hypopnea syndrome , AHI 17.8 per hour with nonpositional hypopneas. CPAP titration to 12 CWP, AHI 2.4 per hour. On nocturnal CPAP via a small resMed Quattro full-face mask with heated humidifier.   . Osteoporosis    DEXA (12/09/2011): L-spine T -3.7, left hip T -1.4 DEXA (12/2004): L-spine T -2.6, left hip -0.1   . Paroxysmal atrial fibrillation (HCC)  10/22/2010   s/p Left atrial maze procedure for paroxysmal atrial fibrillation on 09/20/2010 by Dr Prescott Gum.  Subsequent splenic infarct, decision was made to re-anticoagulate with coumadin, likely life-long as this is the most likely cause of the splenic infarct.   . Pulmonary hypertension due to chronic obstructive pulmonary disease ([place]) 04/25/2016  . Right nephrolithiasis 09/06/2014   5 mm non-obstructing calculus seen on CT scan 09/05/2014   . Seborrheic keratosis 09/28/2015  . Shortness of breath dyspnea   . Tobacco abuse 07/28/2012  . Type 2 diabetes mellitus with diabetic neuropathy [facility])    Social History   Social History  . Marital status: Divorced    Spouse name: N/A  . Number of children: N/A  . Years of education: N/A   Social History Main Topics  . Smoking status: Current Every Day Smoker    Packs/day: 1.00    Years: 53.00    Types: Cigarettes    Start date: 11/01/1962  . Smokeless tobacco: Never Used      Comment: 1/2 to 1 pack per day  . Alcohol use No  . Drug use: No  . Sexual activity: No   Other Topics Concern  . Not on file   Social History Narrative   Lives alone in [place] ([place]) with 60 pound chow   Worked at [employer] for 18 years   No car   Family History  Problem Relation Age of Onset  . Peptic Ulcer Disease Father   . Heart attack Father 56       Died of MI at age 5  . Heart attack Brother 19       Died of MI at age 44  . Obesity Brother   . Pneumonia Mother   . Healthy Sister   . Lupus Daughter   . Obsessive Compulsive Disorder Daughter    ASSESSMENT Recent Results: Lab Results  Component Value Date   INR 2.5 11/05/2016   INR 2.2 10/06/2016   INR 2.10 09/08/2016   Anticoagulation Dosing: INR as of 11/05/2016 and Previous Warfarin Dosing Information    INR Dt INR Goal [name] Sun Mon Tue Wed Thu Fri Sat   11/05/2016 2.5 1.5-2.5 15 mg 2.5 mg 0 mg 2.5 mg 2.5 mg 2.5 mg 2.5 mg 2.5 mg    Previous description   Take ONE-HALF (1/2) tablet of your CURRENT 42m peach colored warfarin tablet by mouth, once-daily at 6PM all days of week, EXCEPT on Mondays. Do NOT take any warfarin on Mondays of each week. When you have expended all tablets of your 579mpeach colored warfarin tablets and are ready to transition to use of your 2.87m42mreen colored warfarin tablets that Dr. KliEppie Gibsons provided you a prescription for--please let me know by calling:  [phone] that your tablet strength can be changed in the www.doseresponse.com database.    Anticoagulation Warfarin Dose Instructions as of 11/05/2016      Total Sun Mon Tue Wed Thu Fri Sat   New Dose 15 mg 2.5 mg 0 mg 2.5 mg 2.5 mg 2.5 mg 2.5 mg 2.5 mg     (5 mg x 0.5)  -  (5 mg x 0.5)  (5 mg x 0.5)  (5 mg x 0.5)  (5 mg x 0.5)  (5 mg x 0.5)                         Description   Take ONE-HALF (  1/2) tablet of your CURRENT 72m peach colored warfarin tablet by mouth, once-daily at 6PM all days of  week, EXCEPT on Mondays. Do NOT take any warfarin on Mondays of each week. When you have expended all tablets of your 573mpeach colored warfarin tablets and are ready to transition to use of your 2.57m49mreen colored warfarin tablets that Dr. KliEppie Gibsons provided you a prescription for--please let me know by calling:  [phone] that your tablet strength can be changed in the www.doseresponse.com database.      INR today: Therapeutic  PLAN Weekly dose was unchanged. Freestyle Libre Pro CGM sensor placed and started. Patient was educated about wearing sensor, keeping food, activity and medication log and when to call office. Follow up was arranged with the patient.    Patient Instructions  Patient educated about medication as defined in this encounter and verbalized understanding by repeating back instructions provided.   Patient advised to contact clinic or seek medical attention if signs/symptoms of bleeding or thromboembolism occur.  Patient verbalized understanding by repeating back information and was advised to contact me if further medication-related questions arise. Patient was also provided an information handout.  Follow-up Return in about 4 weeks (around 12/05/2016).  [name]

## 2016-11-05 NOTE — Progress Notes (Signed)
T- 98.1 per pt's request.

## 2016-11-05 NOTE — Progress Notes (Signed)
Reviewed Dr Lenon Curt: this is Dr Caroline More patient not mine

## 2016-11-06 ENCOUNTER — Other Ambulatory Visit: Payer: Self-pay | Admitting: Internal Medicine

## 2016-11-12 ENCOUNTER — Encounter: Payer: Self-pay | Admitting: Internal Medicine

## 2016-11-12 ENCOUNTER — Ambulatory Visit (INDEPENDENT_AMBULATORY_CARE_PROVIDER_SITE_OTHER): Payer: PPO | Admitting: Internal Medicine

## 2016-11-12 ENCOUNTER — Telehealth: Payer: Self-pay | Admitting: *Deleted

## 2016-11-12 ENCOUNTER — Ambulatory Visit: Payer: PPO | Admitting: Pharmacist

## 2016-11-12 ENCOUNTER — Ambulatory Visit (INDEPENDENT_AMBULATORY_CARE_PROVIDER_SITE_OTHER): Payer: PPO | Admitting: Dietician

## 2016-11-12 VITALS — BP 160/43 | HR 63 | Temp 98.0°F | Ht 62.0 in | Wt 201.3 lb

## 2016-11-12 DIAGNOSIS — E11649 Type 2 diabetes mellitus with hypoglycemia without coma: Secondary | ICD-10-CM | POA: Diagnosis not present

## 2016-11-12 DIAGNOSIS — E119 Type 2 diabetes mellitus without complications: Secondary | ICD-10-CM

## 2016-11-12 DIAGNOSIS — Z713 Dietary counseling and surveillance: Secondary | ICD-10-CM | POA: Diagnosis not present

## 2016-11-12 DIAGNOSIS — E1140 Type 2 diabetes mellitus with diabetic neuropathy, unspecified: Secondary | ICD-10-CM

## 2016-11-12 DIAGNOSIS — J438 Other emphysema: Secondary | ICD-10-CM | POA: Diagnosis not present

## 2016-11-12 DIAGNOSIS — Z794 Long term (current) use of insulin: Secondary | ICD-10-CM | POA: Diagnosis not present

## 2016-11-12 DIAGNOSIS — Z7951 Long term (current) use of inhaled steroids: Secondary | ICD-10-CM | POA: Diagnosis not present

## 2016-11-12 MED ORDER — IPRATROPIUM-ALBUTEROL 0.5-2.5 (3) MG/3ML IN SOLN: 3.0000 mL | Freq: Once | RESPIRATORY_TRACT | Status: DC

## 2016-11-12 MED ORDER — GLUCOSE BLOOD VI STRP: 1.0000 | ORAL_STRIP | Freq: Four times a day (QID) | 11 refills | Status: DC

## 2016-11-12 MED ORDER — IPRATROPIUM BROMIDE 0.02 % IN SOLN
0.5000 mg | Freq: Once | RESPIRATORY_TRACT | Status: AC
  Administered 2016-11-12: 0.5 mg via RESPIRATORY_TRACT

## 2016-11-12 MED ORDER — INSULIN PEN NEEDLE 31G X 6 MM MISC: 3 refills | Status: DC

## 2016-11-12 MED ORDER — ALBUTEROL SULFATE (2.5 MG/3ML) 0.083% IN NEBU
2.5000 mg | INHALATION_SOLUTION | Freq: Once | RESPIRATORY_TRACT | Status: AC
  Administered 2016-11-12: 2.5 mg via RESPIRATORY_TRACT

## 2016-11-12 NOTE — Patient Instructions (Addendum)
It was a pleasure to see you again [name].  We have given you a breathing treatment in clinic today.  Please continue your Advair and Spiriva as prescribed.  Please restart your Lasix as prescribed as it looks like you are starting to gain fluid.  Please do your best to cut back on smoking.  Follow up with Dr. Eppie Gibson as scheduled on 12/05/16 or see [place] sooner if needed.

## 2016-11-12 NOTE — Progress Notes (Signed)
Internal Medicine Clinic Attending  Case discussed with Dr. Patel at the time of the visit.  We reviewed the resident's history and exam and pertinent patient test results.  I agree with the assessment, diagnosis, and plan of care documented in the resident's note.  

## 2016-11-12 NOTE — Progress Notes (Signed)
CC: Requesting breathing treatment  HPI:  [name] is a 66 y.o. female with PMH as listed below including COPD/Pulmonary emphysema on 2L O2 via [place], Pulmonary HTN, RV failure, Mitral Stenosis s/p bioprosthetic valve replacement, Paroxysmal Afib on Coumadin, GI bleeding, CAD, OSA on CPAP, T2DM, and HLD who was seen in clinic today requesting a breathing treatment while being seen by our Diabetes and Nutrition specialist.  Patient states that she has felt some worsening of her breathing from baseline over the last 1-2 weeks during which time she has had to use her rescue Albuterol inhaler 4-5 times total. She reports adherence to her Advair twice daily and Spiriva once daily. She wears supplemental oxygen at 2 L via [place] at home, but says she does not wear this continuously. She admits that she continues to smoke. She rolls her own cigarettes and thinks she is smoking less than a pack per day. She has a pulse oximeter at home but does not check her O2 levels. She admits that she has not taken her Lasix (80 mg am 40 mg pm) the last 2 days or this morning. She says she is using her CPAP nightly, but has not slept well the last 2 weeks and feels her depression is contributing to her sleep difficulty as she feels overwhelmed with all of her chronic medical conditions. She has a chronic cough productive of white sputum which is unchanged. She notices occasional wheezing. She denies any recent fever. She says she has not needed to increase her home oxygen.  Past Medical History:  Diagnosis Date  . Adenomatous polyps 05/14/2011   Colonoscopy (05/2011): 4 mm adenomatous polyp excised endoscopically Colonoscopy (02/2002): Adenomatous polyp excised endoscopically   . Allergic rhinitis 06/01/2012  . Anemia of chronic disease 01/01/2013  . Anxiety 07/24/2010  . Aortic atherosclerosis ([place]) 10/19/2014   Seen on CT scan, currently asymptomatic  . 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 Asymptomatic cholelithiasis 09/25/2015   Seen on CT scan 08/2015  . Bilateral cataracts 08/04/2012   Visually insignificant   . Carotid artery stenosis    s/p right endarterectomy (06/2010) Carotid US (07/2010):  Left: Moderate-to-severe (60-79%) calcific and non-calcific plaque origin and proximal ICA and ECA   . Chronic congestive heart failure with left ventricular diastolic dysfunction ([place]) 10/21/2010  . Chronic constipation 02/03/2011  . Chronic daily headache 01/16/2014  . Chronic low back pain 10/06/2012  . Chronic venous insufficiency 08/04/2012  . Clear cell renal cell carcinoma ([place]) 07/21/2011   s/p cryoablation of left RCC in 09/2011 by [name]. Followed by [name]  [facility]) .    Marland Kitchen COPD (chronic obstructive pulmonary disease) with emphysema (HCC)    PFTs 02/2012: FEV1 0.92 (40%), ratio 69, 27% increase in FEV1 with BD, TLC 91%, severe airtrapping, DLCO49% On chronic home O2. Pulmonary rehab referral 05/2012   . Depression 11/19/2005  . Esophageal stenosis 08/08/2015   Mild, benign-appearing on EGD 06/29/2015  . Fibromyalgia 08/29/2010  . Gastroesophageal reflux disease   . History of blood transfusion    "several times"   . Hyperlipidemia LDL goal < 100 11/20/2005  . Internal hemorrhoids 08/04/2012  . Mitral stenosis    s/p Mitral valve replacement with a 27-mm pericardial porcine valve (Medtronic Mosaic valve, serial #[id number] on 09/20/10, Dr. Prescott Gum)   . Moderate to severe pulmonary hypertension ([place])    2014 TEE w PA peak pressure 46 mmHg,  s/p MV replacement   . Obesity (BMI 30.0-34.9) 10/23/2011  . Obstructive sleep apnea    Nocturnal polysomnography (06/2009): Moderate sleep apnea/ hypopnea syndrome , AHI 17.8 per hour with nonpositional hypopneas. CPAP titration to 12 CWP, AHI 2.4 per hour. On nocturnal CPAP via a small resMed Quattro full-face mask with heated humidifier.   . Osteoporosis     DEXA (12/09/2011): L-spine T -3.7, left hip T -1.4 DEXA (12/2004): L-spine T -2.6, left hip -0.1   . Paroxysmal atrial fibrillation ([place]) 10/22/2010   s/p Left atrial maze procedure for paroxysmal atrial fibrillation on 09/20/2010 by Dr Prescott Gum.  Subsequent splenic infarct, decision was made to re-anticoagulate with coumadin, likely life-long as this is the most likely cause of the splenic infarct.   . Pulmonary hypertension due to chronic obstructive pulmonary disease ([place]) 04/25/2016  . Right nephrolithiasis 09/06/2014   5 mm non-obstructing calculus seen on CT scan 09/05/2014   . Seborrheic keratosis 09/28/2015  . Shortness of breath dyspnea   . Tobacco abuse 07/28/2012  . Type 2 diabetes mellitus with diabetic neuropathy ([place])    Review of Systems:  Review of Systems  Constitutional: Negative for fever and weight loss.  Respiratory: Positive for cough, shortness of breath and wheezing. Negative for hemoptysis.   Cardiovascular: Positive for leg swelling.  Psychiatric/Behavioral: Positive for depression. The patient has insomnia.      Physical Exam:  Vitals:   11/12/16 1028  BP: (!) 160/43  Pulse: 63  Temp: 98 F (36.7 C)  TempSrc: Oral  SpO2: 95%  Weight: 201 lb 4.8 oz (91.3 kg)  Height: _0  (1.575 m)   Physical Exam  Constitutional: She is oriented to person, place, and time. She appears well-developed and well-nourished. No distress.  Wearing supplement O2 via [place] which she removed during examination.  HENT:  Head: Normocephalic and atraumatic.  Cardiovascular: Normal rate and regular rhythm.   Systolic murmur loudest left sternal border  Pulmonary/Chest: Effort normal. No respiratory distress.  Distant breath sounds with faint crackles bilateral lung fields. No respiratory distress. Speaking comfortable at rest in complete sentences.  Musculoskeletal:  Trace to +1 pitting edema bilateral lower extremities.  Neurological: She is alert and oriented to person, place, and  time.  Skin: Skin is warm. She is not diaphoretic.    Assessment & Plan:   See Encounters Tab for problem based charting.  Patient discussed with Dr. Angelia Mould  Pulmonary emphysema [facility]) Patient with 1-2 weeks of somewhat worsened dyspnea from her baseline. She is using her supplemental oxygen inconsistently and continues to smoke. She says she is using her CPAP but is having difficulty sleeping along this same time period. She says she is using her Advair 500-50 mcg 1 puff BID and Spiriva 18 mcg 1 puff daily as prescribed. She has needed to use her rescue Albuterol inhaler 4-5 times during this time period, but not daily.  On my exam, her breath sounds are distant with faint crackles bilateral upper and lower lung fields without obvious wheezing. She admits to skipping her Lasix the last 2 days and does have trace to +1 pitting edema both legs and weight is up to 201 lbs from 193 lbs 2.5 months ago.  She does not appear to be in acute exacerbation of her obstructive lung disease or her chronic HFpEF although she does have a poor baseline from both of these conditions. Her depression and anxiety are likely playing a role as well with regards to motivation to take  her medications. She is not checking her oxygen at home, but would consider over oxygenation as well as a possible contributing factor.  Plan: She is given an Albuterol and Ipratropium nebulizer in clinic with subjective improvement. I advised her to resume her lasix as she is showing signs of early volume overload. She is also advised to continue her CPAP at night and supplemental oxygen. She will follow up in clinic on 12/05/16.

## 2016-11-12 NOTE — Progress Notes (Signed)
[name] wore the CGM for 6 days. The average reading was 143, % time in target was 70, % time below target was 7, and % time above target was. 23. Intervention will be to focus on reduction of hypoglycemia by decreasing mealtime insulin with higher fat, lower carb foods, directions/education to not take correction insulin more than 4 times in 24 hours to prevent insulin stacking and to check blood sugar to use for correction closer to the time she gives insulin.  Suggest repeat CGM and if she continues to have hypoglycemia, would consider decreasing long acting insulin doses minimally. The patient will be scheduled to see diabetes educator in 4 weeks for a follow up appointment. (her sensor became dislodged yesterday and was no longer collecting data.)

## 2016-11-12 NOTE — Patient Instructions (Addendum)
Decrease base mealtime insulin to 15 units with higher fat foods like pizza, cheeseburger and the little Debbie's  You did a terrific job checking your blood sugars, keeping records! Your blood sugars are well controlled at an average between 140-180 mg/dl.  Please try to check blood sugar close to time you take your Novolog so you do not stack your insulin.    Do not take Novolog correction more than 4 times in a 24 hours period of time.

## 2016-11-12 NOTE — Progress Notes (Signed)
Medical Nutrition Therapy:    Appt start time: 0940 end time:  1030.  Assessment:  Primary concerns today: Blood sugar control.  Reports she is having a hard time since her freind with whom she talked passed away. She signed up for two programs for support and has not heard from either. She is interested in the monthly diabetes group meeting and is looking onto arranging a ride.    Weight is increasing, has not been taking lasix. Reassess after taking lasix regularly. Usual weight between 185-195#.   Diabetes Medicines: Levemir 37 units twice daily (74 units total) , Novolog average of 6 days is 74 units for a TDD of 148 units which is 1.64 units/kg   Blood sugars: per CGM  Worn for 6 days, 70% in target 70-180, 23% >180 and 7% below 70.  Her glucose pattern is above target from 8 Pm to 3 am and in target from 3am to 8 PM.  Lows occur between 6am-9am and again between 12 Pm and 4 PM. Most likley reason for hr low blood sugars is overcouniting food intake or insulin stacking. Blood sugar meter average is 187 x30 days, averages at breakfast 186, lunch 181/214 dinner 181/149 and night 199. She is an ideal candidate for the freestyle libre personal CGM to help prevent hypoglycemia but she does not want to investigate due to fear of copays.   Food intake: not ideal, but she has not been feeling well. High fat, processed foods low in nutrient density, fruits, vegetables and whole grains  Usual physical activity includes: adls and small ab mounts of walking.   Progress Towards Goal(s):  No progress in nutrient density, but she has made progress in blood sugar control.   Nutritional Diagnosis:  NB-1.1 Food and nutrition-related knowledge deficit As related to lack of previous information about nutrient density without progress as evidenced by her comments of feeling depressed and her food choices, her food record  and our discussion today. Coordination of care: Redo CGM either at next visit if abel  or 3 months to reassess for hypoglycemia. Consider and insulin sensitizer    Intervention:  Nutrition education about carb vs fat content of foods, support. Network engineer.  Monitoring/Evaluation:  Dietary intake, exercise, meter, and body weight prn. Ryett Hamman, Butch Penny, [place] 11/12/2016 3:41 PM.

## 2016-11-12 NOTE — Assessment & Plan Note (Addendum)
Patient with 1-2 weeks of somewhat worsened dyspnea from her baseline. She is using her supplemental oxygen inconsistently and continues to smoke. She says she is using her CPAP but is having difficulty sleeping along this same time period. She says she is using her Advair 500-50 mcg 1 puff BID and Spiriva 18 mcg 1 puff daily as prescribed. She has needed to use her rescue Albuterol inhaler 4-5 times during this time period, but not daily.  On my exam, her breath sounds are distant with faint crackles bilateral upper and lower lung fields without obvious wheezing. She admits to skipping her Lasix the last 2 days and does have trace to +1 pitting edema both legs and weight is up to 201 lbs from 193 lbs 2.5 months ago.  She does not appear to be in acute exacerbation of her obstructive lung disease or her chronic HFpEF although she does have a poor baseline from both of these conditions. Her depression and anxiety are likely playing a role as well with regards to motivation to take her medications. She is not checking her oxygen at home, but would consider over oxygenation as well as a possible contributing factor.  Plan: She is given an Albuterol and Ipratropium nebulizer in clinic with subjective improvement. I advised her to resume her lasix as she is showing signs of early volume overload. She is also advised to continue her CPAP at night and supplemental oxygen. She will follow up in clinic on 12/05/16.

## 2016-11-12 NOTE — Telephone Encounter (Signed)
[name] asked about getting the BD pen needles for her insulin injections. [facility] does not have the 6 MM. She wants to try [facility].

## 2016-11-12 NOTE — Telephone Encounter (Signed)
Pt walks in w/ donnap. And states she needs a breathing treatment, she is informed she needs to be eval and determined that she needs it, she is not happy w/ appt but agreeable.

## 2016-11-14 ENCOUNTER — Telehealth: Payer: Self-pay | Admitting: Dietician

## 2016-11-14 NOTE — Telephone Encounter (Signed)
Provided support about diabetes self care. [name] confirmed that she and her sister will be attending the group diabetes meeting in October. She requested assistance in getting the freestyle libre personal CGM.

## 2016-11-17 ENCOUNTER — Telehealth: Payer: Self-pay | Admitting: Internal Medicine

## 2016-11-17 NOTE — Telephone Encounter (Signed)
[name] says [facility] can only get her BD 32 guage x 6 mm pen needles and to do so they need an amended prescription. Will ask triage nurses assistance.

## 2016-11-17 NOTE — Telephone Encounter (Signed)
I spoke with [name] this afternoon via the phone.  I had called to ask why she was not on metformin.  [name] had asked this question of me and I could find no reason in the chart  other than I had not thought about it.  I had been so engrossed in insulin regimen adjustments that the thought of restarting metformin had never crossed my mind.  After speaking with [name], it appears that the metformin was stopped during a hospitalization in 2011, maybe when the insulin was started.  She has heard some bad press about it recently and I recommended that she think about it for the next few weeks and we can discuss it more fully when I see her at the end of the month.  I told her I thought it may help stabilize her sugars, especially given the potential difficulties with the insulin stacking using the novolog.  She also mentioned the recent death of her friend that has her more depressed then usual.  She is not suicidal at this time.  Finally, her fibromyalgia appears to be acting up and she brought the issue of Cymbalta up as she had been on this until it was stopped in 2014 (as it was no longer effective for her depression).  We decided that at the next visit we would only concentrate on three issues 1) diabetes, 2) depression, and 3) fibromyalgia.  Past appointments have been distracted by so many different chronic medical issues being addressed that we may not have spent enough time working on these three.

## 2016-11-24 ENCOUNTER — Telehealth: Payer: Self-pay | Admitting: Dietician

## 2016-11-24 ENCOUNTER — Encounter: Payer: Self-pay | Admitting: Interventional Radiology

## 2016-11-24 ENCOUNTER — Telehealth: Payer: Self-pay

## 2016-11-24 DIAGNOSIS — L292 Pruritus vulvae: Secondary | ICD-10-CM

## 2016-11-24 NOTE — Telephone Encounter (Signed)
Would like [name] to call back.  

## 2016-11-24 NOTE — Telephone Encounter (Signed)
Per patient request, called [employer] who is in network with her Medicare advantage company, to start process for [employer] CGM. Patient notified that they will contact her prior to mailing anything out.

## 2016-11-25 ENCOUNTER — Other Ambulatory Visit: Payer: Self-pay | Admitting: Internal Medicine

## 2016-11-25 DIAGNOSIS — B373 Candidiasis of vulva and vagina: Secondary | ICD-10-CM

## 2016-11-25 DIAGNOSIS — B3731 Acute candidiasis of vulva and vagina: Secondary | ICD-10-CM

## 2016-11-25 NOTE — Telephone Encounter (Signed)
Pt would like a referral to gyn so she can get the rash/ "rawness" problem of labial area

## 2016-11-26 ENCOUNTER — Other Ambulatory Visit: Payer: Self-pay | Admitting: Internal Medicine

## 2016-11-26 DIAGNOSIS — J439 Emphysema, unspecified: Secondary | ICD-10-CM

## 2016-11-27 ENCOUNTER — Ambulatory Visit: Payer: PPO | Admitting: Dietician

## 2016-11-27 DIAGNOSIS — E1140 Type 2 diabetes mellitus with diabetic neuropathy, unspecified: Secondary | ICD-10-CM

## 2016-11-27 DIAGNOSIS — Z794 Long term (current) use of insulin: Principal | ICD-10-CM

## 2016-11-27 NOTE — Patient Instructions (Signed)
Zandra,  It was a pleasure having you and [name] in group today!  So glad to see you!  We look forward to having your join [place] again on December 25, 2016.   Happy Halloween!  Butch Penny [phone]

## 2016-11-27 NOTE — Progress Notes (Addendum)
Diabetes Self Management Education and Support: Patient attended diabetes education group visit today for 60 minutes. The program included successes and challenges over the past few weeks, goal setting and evaluation, how to read a meter download report, what is fiber and why is it important and a cooking demo of chocolate chia pudding.    Blood sugars:  Not addressed today Health maintenance:  She is up to date on these. Weight- not done today Plan: patient plans to attend group diabetes meeting in 1 month. [name], [name], [place] 11/27/2016 1:53 PM.

## 2016-12-01 ENCOUNTER — Telehealth: Payer: Self-pay | Admitting: Internal Medicine

## 2016-12-01 NOTE — Telephone Encounter (Signed)
Patient is requesting that Bonnita Nasuti calls back, no message left

## 2016-12-01 NOTE — Telephone Encounter (Signed)
See documentation in www.doseresponse.com

## 2016-12-02 ENCOUNTER — Encounter (HOSPITAL_COMMUNITY): Payer: PPO | Admitting: Cardiology

## 2016-12-02 ENCOUNTER — Other Ambulatory Visit: Payer: Self-pay | Admitting: *Deleted

## 2016-12-02 DIAGNOSIS — J439 Emphysema, unspecified: Secondary | ICD-10-CM

## 2016-12-02 DIAGNOSIS — J301 Allergic rhinitis due to pollen: Secondary | ICD-10-CM

## 2016-12-02 NOTE — Telephone Encounter (Signed)
rtc to pt, she desires refills of proair, ferrous gluconate and flonase

## 2016-12-03 MED ORDER — ALBUTEROL SULFATE HFA 108 (90 BASE) MCG/ACT IN AERS: 2.0000 | INHALATION_SPRAY | Freq: Four times a day (QID) | RESPIRATORY_TRACT | 5 refills | Status: DC | PRN

## 2016-12-03 MED ORDER — FLUTICASONE PROPIONATE 50 MCG/ACT NA SUSP: 2.0000 | Freq: Every day | NASAL | 3 refills | Status: DC

## 2016-12-05 ENCOUNTER — Encounter: Payer: Self-pay | Admitting: Internal Medicine

## 2016-12-05 ENCOUNTER — Other Ambulatory Visit: Payer: Self-pay | Admitting: Internal Medicine

## 2016-12-05 ENCOUNTER — Encounter: Payer: PPO | Admitting: Internal Medicine

## 2016-12-05 ENCOUNTER — Ambulatory Visit (INDEPENDENT_AMBULATORY_CARE_PROVIDER_SITE_OTHER): Payer: PPO | Admitting: Internal Medicine

## 2016-12-05 ENCOUNTER — Ambulatory Visit (INDEPENDENT_AMBULATORY_CARE_PROVIDER_SITE_OTHER): Payer: PPO | Admitting: Pharmacist

## 2016-12-05 VITALS — BP 137/42 | HR 59 | Temp 98.2°F | Wt 196.2 lb

## 2016-12-05 DIAGNOSIS — Z794 Long term (current) use of insulin: Secondary | ICD-10-CM

## 2016-12-05 DIAGNOSIS — M5382 Other specified dorsopathies, cervical region: Secondary | ICD-10-CM | POA: Diagnosis not present

## 2016-12-05 DIAGNOSIS — B373 Candidiasis of vulva and vagina: Secondary | ICD-10-CM | POA: Diagnosis not present

## 2016-12-05 DIAGNOSIS — F332 Major depressive disorder, recurrent severe without psychotic features: Secondary | ICD-10-CM

## 2016-12-05 DIAGNOSIS — Z79891 Long term (current) use of opiate analgesic: Secondary | ICD-10-CM | POA: Diagnosis not present

## 2016-12-05 DIAGNOSIS — M545 Low back pain, unspecified: Secondary | ICD-10-CM

## 2016-12-05 DIAGNOSIS — Z7901 Long term (current) use of anticoagulants: Secondary | ICD-10-CM

## 2016-12-05 DIAGNOSIS — G8929 Other chronic pain: Secondary | ICD-10-CM

## 2016-12-05 DIAGNOSIS — F17210 Nicotine dependence, cigarettes, uncomplicated: Secondary | ICD-10-CM

## 2016-12-05 DIAGNOSIS — M797 Fibromyalgia: Secondary | ICD-10-CM

## 2016-12-05 DIAGNOSIS — R269 Unspecified abnormalities of gait and mobility: Secondary | ICD-10-CM

## 2016-12-05 DIAGNOSIS — E1142 Type 2 diabetes mellitus with diabetic polyneuropathy: Secondary | ICD-10-CM | POA: Diagnosis not present

## 2016-12-05 DIAGNOSIS — J309 Allergic rhinitis, unspecified: Secondary | ICD-10-CM | POA: Diagnosis not present

## 2016-12-05 DIAGNOSIS — I480 Paroxysmal atrial fibrillation: Secondary | ICD-10-CM

## 2016-12-05 DIAGNOSIS — E1140 Type 2 diabetes mellitus with diabetic neuropathy, unspecified: Secondary | ICD-10-CM

## 2016-12-05 DIAGNOSIS — J301 Allergic rhinitis due to pollen: Secondary | ICD-10-CM

## 2016-12-05 DIAGNOSIS — Z79899 Other long term (current) drug therapy: Secondary | ICD-10-CM

## 2016-12-05 DIAGNOSIS — K5520 Angiodysplasia of colon without hemorrhage: Secondary | ICD-10-CM

## 2016-12-05 DIAGNOSIS — B3731 Acute candidiasis of vulva and vagina: Secondary | ICD-10-CM

## 2016-12-05 DIAGNOSIS — F322 Major depressive disorder, single episode, severe without psychotic features: Secondary | ICD-10-CM

## 2016-12-05 LAB — GLUCOSE, CAPILLARY: Glucose-Capillary: 189 mg/dL — ABNORMAL HIGH (ref 65–99)

## 2016-12-05 LAB — POCT INR: INR: 2.2

## 2016-12-05 LAB — POCT GLYCOSYLATED HEMOGLOBIN (HGB A1C): Hemoglobin A1C: 7.2

## 2016-12-05 MED ORDER — HYDROCODONE-ACETAMINOPHEN 5-325 MG PO TABS: 1.0000 | ORAL_TABLET | Freq: Four times a day (QID) | ORAL | 0 refills | Status: DC | PRN

## 2016-12-05 MED ORDER — FERROUS GLUCONATE 324 (38 FE) MG PO TABS: 324.0000 mg | ORAL_TABLET | Freq: Two times a day (BID) | ORAL | 3 refills | Status: DC

## 2016-12-05 MED ORDER — FLUTICASONE PROPIONATE 50 MCG/ACT NA SUSP: 2.0000 | Freq: Every day | NASAL | 0 refills | Status: DC

## 2016-12-05 MED ORDER — NYSTATIN 100000 UNIT/GM EX POWD: Freq: Four times a day (QID) | CUTANEOUS | 1 refills | Status: DC

## 2016-12-05 MED ORDER — KETOROLAC TROMETHAMINE 30 MG/ML IJ SOLN
60.0000 mg | Freq: Once | INTRAMUSCULAR | Status: AC
  Administered 2016-12-05: 60 mg via INTRAMUSCULAR

## 2016-12-05 MED ORDER — BUPROPION HCL 75 MG PO TABS: 75.0000 mg | ORAL_TABLET | Freq: Two times a day (BID) | ORAL | 3 refills | Status: DC

## 2016-12-05 MED ORDER — DULOXETINE HCL 60 MG PO CPEP: 60.0000 mg | ORAL_CAPSULE | Freq: Every day | ORAL | 3 refills | Status: DC

## 2016-12-05 NOTE — Progress Notes (Signed)
INTERNAL MEDICINE TEACHING ATTENDING ADDENDUM - [name] M.D  Duration- indefinte, Indication- pAfib, INR- therapeutic. Agree with pharmacy recommendations as outlined in their note.

## 2016-12-05 NOTE — Patient Instructions (Signed)
Patient educated about medication as defined in this encounter and verbalized understanding by repeating back instructions provided.

## 2016-12-05 NOTE — Progress Notes (Addendum)
Anticoagulation Management [name] a 66 y.o.femalewho reports to the clinic for monitoring of warfarintreatment.   Indication: atrial fibrillation Duration: indefinite Supervising physician: [name]  Anticoagulation Clinic Visit History: Patient does notreport signs/symptoms of bleeding or thromboembolism  Other recent changes: No diet, medications, lifestyle changes endorsed to me by the patient.   Anticoagulation Episode Summary    Current INR goal:   1.5-2.5  TTR:   80.7 % (3.7 y)  Next INR check:   01/05/2017  INR from last check:   2.2 (12/05/2016)  Weekly max warfarin dose:     Target end date:     INR check location:   [facility]  Preferred lab:     Send INR reminders to:   [facility] [place]   Indications   Paroxysmal atrial fibrillation ([place]) [I48.0] Long term current use of anticoagulant therapy [Z79.01]       Comments:         Anticoagulation Care Providers    Provider Role Specialty Phone number   Lelon Perla, MD  Cardiology [phone]      Allergies  Allergen Reactions  . Lorazepam Other (See Comments)    Patient's sister noted that ativan caused the patient to become extremely confused during hospitalization 09/2010; tolerates Xanax  . Morphine And Related Other (See Comments)    Injection site reaction  . Oxycontin [Oxycodone] Other (See Comments)    headache  . Tramadol Hcl Swelling    Ankle swelling   Medication Sig  albuterol (PROVENTIL HFA;VENTOLIN HFA) 108 (90 Base) MCG/ACT inhaler Inhale 2 puffs into the lungs every 6 (six) hours as needed for wheezing or shortness of breath.  ALPRAZolam (XANAX) 1 MG tablet TAKE 1/2 TO 1 TABLET BY MOUTH TWICE DAILY AS NEEDED FOR ANXIETY  Blood Glucose Monitoring Suppl (ONETOUCH VERIO) W/DEVICE KIT 1 each by Does not apply route 4 (four) times daily.  chlorpheniramine (CHLOR-TRIMETON) 4 MG tablet Take 1 tablet (4 mg total) by mouth 3 (three) times daily as  needed for allergies.  cyclobenzaprine (FLEXERIL) 5 MG tablet Take 1 tablet (5 mg total) by mouth 3 (three) times daily as needed for muscle spasms.  ferrous sulfate 325 (65 FE) MG tablet Take 325 mg by mouth 2 (two) times daily with a meal.  fluconazole (DIFLUCAN) 150 MG tablet Take 1 tablet (150 mg total) by mouth once as needed.  FLUoxetine 60 MG TABS Take 60 mg by mouth daily.  fluticasone (FLONASE) 50 MCG/ACT nasal spray Place 2 sprays into both nostrils daily.  Fluticasone-Salmeterol (ADVAIR DISKUS) 500-50 MCG/DOSE AEPB Inhale 1 puff into the lungs 2 (two) times daily.  furosemide (LASIX) 80 MG tablet Take 1 tab in AM and 1/2 tab at 3 pm  gabapentin (NEURONTIN) 300 MG capsule Take 2 capsules (600 mg total) by mouth 3 (three) times daily.  glucose blood (ONETOUCH VERIO) test strip 1 each by Other route QID. Use as instructed  HYDROcodone-acetaminophen (NORCO/VICODIN) 5-325 MG tablet Take 1-2 tablets by mouth every 6 (six) hours as needed for severe pain.  insulin aspart (NOVOLOG) 100 UNIT/ML FlexPen Inject 18-28 Units into the skin 3 (three) times daily with meals.  Insulin Detemir (LEVEMIR) 100 UNIT/ML Pen USE 37 UNITS TWICE DAILY  Insulin Pen Needle 31G X 6 MM MISC Use to inject insulin 5 times daily  Insulin Syringe-Needle U-100 31G X 15/64" 0.5 ML MISC Use to inject insulin up to 4 times a day  Lancets Misc. (ACCU-CHEK FASTCLIX LANCET) KIT Check your  blood 4 times a day dx code 250.00 insulin requiring  metoprolol succinate (TOPROL-XL) 25 MG 24 hr tablet Take 1 tablet (25 mg total) by mouth daily.  nystatin cream (MYCOSTATIN) Apply 1 application topically 2 (two) times daily.  omeprazole (PRILOSEC) 40 MG capsule Take 1 capsule (40 mg total) by mouth 2 (two) times daily.  OXYGEN Inhale 2 L into the lungs continuous.  potassium chloride SA (K-DUR,KLOR-CON) 20 MEQ tablet Take 1 tablet (20 mEq total) by mouth 2 (two) times daily.  promethazine (PHENERGAN) 12.5 MG tablet Take 1 tablet (12.5  mg total) by mouth every 8 (eight) hours as needed for nausea or vomiting.  rosuvastatin (CRESTOR) 20 MG tablet Take 1 tablet (20 mg total) by mouth at bedtime.  tiotropium (SPIRIVA HANDIHALER) 18 MCG inhalation capsule Place 1 capsule (18 mcg total) into inhaler and inhale daily.  warfarin (COUMADIN) 2.5 MG tablet TAKE 1 TABLET BY MOUTH DAILY AT 6 PM EXCEPT ON MONDAYS   Past Medical History:  Diagnosis Date  . Adenomatous polyps 05/14/2011   Colonoscopy (05/2011): 4 mm adenomatous polyp excised endoscopically Colonoscopy (02/2002): Adenomatous polyp excised endoscopically   . Allergic rhinitis 06/01/2012  . Anemia of chronic disease 01/01/2013  . Anxiety 07/24/2010  . Aortic atherosclerosis ([place]) 10/19/2014   Seen on CT scan, currently asymptomatic  . 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 Asymptomatic cholelithiasis 09/25/2015   Seen on CT scan 08/2015  . Bilateral cataracts 08/04/2012   Visually insignificant   . Carotid artery stenosis    s/p right endarterectomy (06/2010) Carotid US (07/2010):  Left: Moderate-to-severe (60-79%) calcific and non-calcific plaque origin and proximal ICA and ECA   . Chronic congestive heart failure with left ventricular diastolic dysfunction ([place]) 10/21/2010  . Chronic constipation 02/03/2011  . Chronic daily headache 01/16/2014  . Chronic low back pain 10/06/2012  . Chronic venous insufficiency 08/04/2012  . Clear cell renal cell carcinoma ([place]) 07/21/2011   s/p cryoablation of left RCC in 09/2011 by [name]. Followed by [name]  [facility]) .    Marland Kitchen COPD (chronic obstructive pulmonary disease) with emphysema ([facility])    PFTs 02/2012: FEV1 0.92 (40%), ratio 69, 27% increase in FEV1 with BD, TLC 91%, severe airtrapping, DLCO49% On chronic home O2. Pulmonary rehab referral 05/2012   . Depression 11/19/2005  . Esophageal stenosis 08/08/2015   Mild, benign-appearing on  EGD 06/29/2015  . Fibromyalgia 08/29/2010  . Gastroesophageal reflux disease   . History of blood transfusion    "several times"   . Hyperlipidemia LDL goal < 100 11/20/2005  . Internal hemorrhoids 08/04/2012  . Mitral stenosis    s/p Mitral valve replacement with a 27-mm pericardial porcine valve (Medtronic Mosaic valve, serial #[id number] on 09/20/10, Dr. Prescott Gum)   . Moderate to severe pulmonary hypertension ([place])    2014 TEE w PA peak pressure 46 mmHg, s/p MV replacement   . Obesity (BMI 30.0-34.9) 10/23/2011  . Obstructive sleep apnea    Nocturnal polysomnography (06/2009): Moderate sleep apnea/ hypopnea syndrome , AHI 17.8 per hour with nonpositional hypopneas. CPAP titration to 12 CWP, AHI 2.4 per hour. On nocturnal CPAP via a small resMed Quattro full-face mask with heated humidifier.   . Osteoporosis    DEXA (12/09/2011): L-spine T -3.7, left hip T -1.4 DEXA (12/2004): L-spine T -2.6, left hip -0.1   . Paroxysmal atrial fibrillation ([place]) 10/22/2010   s/p Left atrial maze procedure for paroxysmal atrial  fibrillation on 09/20/2010 by Dr Prescott Gum.  Subsequent splenic infarct, decision was made to re-anticoagulate with coumadin, likely life-long as this is the most likely cause of the splenic infarct.   . Pulmonary hypertension due to chronic obstructive pulmonary disease ([place]) 04/25/2016  . Right nephrolithiasis 09/06/2014   5 mm non-obstructing calculus seen on CT scan 09/05/2014   . Seborrheic keratosis 09/28/2015  . Shortness of breath dyspnea   . Tobacco abuse 07/28/2012  . Type 2 diabetes mellitus with diabetic neuropathy [facility])    Social History   Social History  . Marital status: Divorced    Spouse name: N/A  . Number of children: N/A  . Years of education: N/A   Social History Main Topics  . Smoking status: Current Every Day Smoker    Packs/day: 1.00    Years: 53.00    Types: Cigarettes    Start date: 11/01/1962  . Smokeless tobacco: Never Used     Comment: 1/2 to 1 pack  per day  . Alcohol use No  . Drug use: No  . Sexual activity: No   Other Topics Concern  . Not on file   Social History Narrative   Lives alone in [place] ([place]) with 60 pound chow   Worked at [employer] for 18 years   No car   Family History  Problem Relation Age of Onset  . Peptic Ulcer Disease Father   . Heart attack Father 87       Died of MI at age 16  . Heart attack Brother 9       Died of MI at age 34  . Obesity Brother   . Pneumonia Mother   . Healthy Sister   . Lupus Daughter   . Obsessive Compulsive Disorder Daughter    ASSESSMENT Recent Results: Lab Results  Component Value Date   INR 2.2 12/05/2016   INR 2.5 11/05/2016   INR 2.2 10/06/2016   Anticoagulation Dosing: INR as of 12/05/2016 and Previous Warfarin Dosing Information    INR Dt INR Goal [name] Sun Mon Tue Wed Thu Fri Sat   12/05/2016 2.2 1.5-2.5 15 mg 2.5 mg 0 mg 2.5 mg 2.5 mg 2.5 mg 2.5 mg 2.5 mg    Previous description   Take ONE-HALF (1/2) tablet of your CURRENT 66m peach colored warfarin tablet by mouth, once-daily at 6PM all days of week, EXCEPT on Mondays. Do NOT take any warfarin on Mondays of each week. When you have expended all tablets of your 59mpeach colored warfarin tablets and are ready to transition to use of your 2.41m17mreen colored warfarin tablets that Dr. KliEppie Gibsons provided you a prescription for--please let me know by calling:  [phone] that your tablet strength can be changed in the www.doseresponse.com database.    Anticoagulation Warfarin Dose Instructions as of 12/05/2016      Total Sun Mon Tue Wed Thu Fri Sat   New Dose 15 mg 2.5 mg 0 mg 2.5 mg 2.5 mg 2.5 mg 2.5 mg 2.5 mg     (5 mg x 0.5)  -  (5 mg x 0.5)  (5 mg x 0.5)  (5 mg x 0.5)  (5 mg x 0.5)  (5 mg x 0.5)                         Description   Take ONE-HALF (1/2) tablet of your CURRENT 41mg48mach colored warfarin tablet by  mouth, once-daily at 6PM all days of week, EXCEPT on  Mondays. Do NOT take any warfarin on Mondays of each week. When you have expended all tablets of your 17m peach colored warfarin tablets and are ready to transition to use of your 2.529mgreen colored warfarin tablets that Dr. KlEppie Gibsonas provided you a prescription for--please let me know by calling:  [phone] that your tablet strength can be changed in the www.doseresponse.com database.      INR today: Therapeutic  PLAN Weekly dose was unchanged   Patient Instructions  Patient educated about medication as defined in this encounter and verbalized understanding by repeating back instructions provided.   Patient advised to contact clinic or seek medical attention if signs/symptoms of bleeding or thromboembolism occur.  Patient verbalized understanding by repeating back information and was advised to contact me if further medication-related questions arise. Patient was also provided an information handout.  Follow-up Return in about 4 weeks (around 01/02/2017).  JeFlossie Dibble

## 2016-12-05 NOTE — Assessment & Plan Note (Signed)
Assessment  Her allergic rhinitis is well controlled symptomatically on Flonase 2 sprays in each nostril daily.  She will also take as needed chlorpheniramine.  Plan  She asked that I write another prescription for Flonase and this was done. We will continue the Flonase and the chlorpheniramine and reassess the efficacy of this therapy in controlling her allergic rhinitis symptoms at the follow-up visit.

## 2016-12-05 NOTE — Assessment & Plan Note (Signed)
Assessment  Her diabetes is much improved today with a hemoglobin A1c of 7.2 down from 8.2 approximately 5 months ago.this is on Levemir 37 units twice daily and NovoLog 18-28 units before meals.  Although she notes a difference in the control of her peripheral neuropathy while taking the gabapentin we will be starting Cymbalta for her depression and may see additional improvements in the management of her peripheral neuropathic pain.  PLAN  Given the marked improvement in her diabetic control and the fact that her A1c is well within target we decided not to initiate the metformin at this time. If we have difficulties in controlling her blood sugars in the future we will reconsider initiating metformin.as noted in the assessment we will be starting Cymbalta for her depression and may see improvement in her peripheral neuropathic pain. At the follow-up visit if her blood pressure is elevated we will start an ACE inhibitor. If it is not elevated we will obtain a urine for microalbumin. She is otherwise up-to-date in her diabetic health care maintenance.

## 2016-12-05 NOTE — Assessment & Plan Note (Signed)
Assessment  She has not had any relief from the nystatin cream. This raises the possibility of an alternative diagnosis then vulvovaginal candidiasis. She also has not received a call for the GYN appointment that I had placed several weeks ago.  Plan  We will stop the nystatin cream and start nystatin powder to see if this may help with her symptoms. She was asked to call the GYN clinic to schedule an appointment directly. We will reassess the adequacy of the nystatin powder at the follow-up visit and assure that she has been seen by GYN.

## 2016-12-05 NOTE — Progress Notes (Signed)
   Subjective:    Patient ID: [name], female    DOB: [birth year], 66 y.o.   MRN: [id number]  HPI  [name] is here for follow-up of her insulin requiring type 2 diabetes complicated by a peripheral neuropathy, moderately severe major depression, and fibromyalgia. Please see the A&P for the status of the pt's chronic medical problems.  Review of Systems  Constitutional: Positive for fever. Negative for unexpected weight change.  Respiratory: Positive for cough, shortness of breath and wheezing. Negative for chest tightness.   Cardiovascular: Negative for chest pain, palpitations and leg swelling.  Gastrointestinal: Negative for abdominal pain.  Genitourinary: Positive for genital sores. Negative for difficulty urinating.       Erythema and pruritis of vulva  Musculoskeletal: Positive for arthralgias, back pain, gait problem, myalgias, neck pain and neck stiffness. Negative for joint swelling.  Skin: Negative for color change, rash and wound.  Allergic/Immunologic: Positive for environmental allergies.  Neurological: Positive for headaches.  Psychiatric/Behavioral: Positive for decreased concentration, depression, dysphoric mood and sleep disturbance. Negative for suicidal ideas. The patient is nervous/anxious.       Objective:   Physical Exam  Constitutional: She is oriented to person, place, and time. She appears well-developed and well-nourished. No distress.  HENT:  Head: Normocephalic and atraumatic.  Eyes: Conjunctivae are normal. Right eye exhibits no discharge. Left eye exhibits no discharge. No scleral icterus.  Cardiovascular: Normal rate, regular rhythm and normal heart sounds.  Exam reveals no gallop and no friction rub.   No murmur heard. Pulmonary/Chest: Effort normal and breath sounds normal. No respiratory distress. She has no wheezes. She has no rales.  Abdominal: Soft. Bowel sounds are normal. She exhibits no distension. There is no tenderness. There is  no rebound and no guarding.  Musculoskeletal: Normal range of motion. She exhibits no edema, tenderness or deformity.  Neurological: She is alert and oriented to person, place, and time. She exhibits normal muscle tone.  Skin: Skin is warm and dry. No rash noted. She is not diaphoretic. No erythema.  Psychiatric: She has a normal mood and affect. Her behavior is normal. Judgment and thought content normal.  Nursing note and vitals reviewed.     Assessment & Plan:   Please see problem oriented charting.

## 2016-12-05 NOTE — Assessment & Plan Note (Signed)
Assessment  She can tell a difference in her fibromyalgia symptoms if she misses a few doses of gabapentin.her current doses 600 mg by mouth 3 times daily. I suspect that some of the worsening in her fibromyalgia symptoms is related to her depression.  Plan  We will continue the gabapentin at 600 mg by mouth 3 times daily and add Cymbalta 60 mg by mouth daily for her depression, fully expecting she may also receive a benefit from this therapy for her fibromyalgia. We will reassess the efficacy of the combination of gabapentin and Cymbalta at the follow-up visit.

## 2016-12-05 NOTE — Patient Instructions (Addendum)
It was great to see you again.  I am so sorry you are feeling so poorly!  1) Stop the Prozac.  We started Wellbutrin 75 mg twice daily and Cymbalta 60 mg daily.  2) Keep up your work on the diabetes.  It is much improved.  3) I refilled numerous medications and will be sure it goes to physician's pharmacy.  I will see you back in 3 months, sooner if necessary.

## 2016-12-05 NOTE — Assessment & Plan Note (Signed)
Assessment  She has had increasing depressive symptoms lately. Not only has she had the loss of her dog, who was one of her closest companions, she also experienced the death of a good friend. She feels the fluoxetine is ineffective and is looking for alternative therapy. Her PHQ-9 was 19 today with no suicidal ideation.  Plan  We will start Cymbalta 60 mg by mouth daily and titrate over time to affect. Given the PHQ-9 score of 19 we will also start bupropion 75 mg by mouth twice daily. She states she does not tolerate higher doses of bupropion, but does tolerate this low dose when she has taken it in the past. The fluoxetine has been discontinued. We will reassess the efficacy of this change at the follow-up visit by assessing her symptoms as well as her PHQ-9. I am hopeful that if we can get better control of her depression we will improve her fibromyalgia symptoms as well as her difficulty with sleep.

## 2016-12-05 NOTE — Assessment & Plan Note (Signed)
Assessment  She continues to take as needed hydrocodone-acetaminophen 5-325 mg 1 tablet every 6 hours as needed for pain. This allows her to control her pain symptoms enough so that she can move about her apartment and be relatively comfortable throughout the day.  Plan  We will continue the hydrocodone-acetaminophen 5-325 mg 1 tablet every 6 hours as needed for pain dispense #120 per month.  We will also continue the cyclobenzaprine 5 mg by mouth every 8 hours as needed for muscle spasms. She was given 3 hard copies of the hydrocodone-acetaminophen which should get her through April 05, 2017.

## 2016-12-06 ENCOUNTER — Other Ambulatory Visit: Payer: Self-pay | Admitting: Internal Medicine

## 2016-12-07 ENCOUNTER — Telehealth: Payer: Self-pay | Admitting: Internal Medicine

## 2016-12-07 NOTE — Telephone Encounter (Signed)
   Reason for call:   I received a call from [name] at [employer] [facility] indicating she has "passed out 40-50 times today".  Upon further questioning she states that during theses episodes she feels she is falling asleep and then wakes up unsure of how long she had been sleeping for. She is diabetic and states that her sugars today have been 95 and 225. She mentioned that on Friday her Prozac was discontinued and she had started taking Cymbalta and Wellbutrin.  She reports these episodes are new for her   On review of systems patient denied fever/chills, nausea/vomiting, headaches, vision changes, focal weakness, slurred speech, chest pain, shortness of breath, or abdominal pain    Pertinent Data:   Diabetic, MDD with medication changes 10/26 from discontinuing prozac and starting cymbalta and buproprion   Assessment / Plan / Recommendations:   This is likely somnolence from medication side effects.  However, I told patient that she would need to be evaluated and recommended coming in to the ED. She stated she did not want to go to the ED. I recommended that if she doesn't want to go to the ED to try and be evaluated tomorrow morning in the IM clinic. She stated that she wanted to see Dr. Eppie Gibson and I told her that he would be precepting in the office for the residents in the morning.    As always, pt is advised that if symptoms worsen or new symptoms arise, they should go to an urgent care facility or to to ER for further evaluation.   Kalman Shan [place], DO   12/07/2016, 6:28 PM

## 2016-12-08 NOTE — Telephone Encounter (Signed)
Lidocaine jelly previously discontinued as the indication for the medication (acute rib fracture) had resolved.

## 2016-12-10 ENCOUNTER — Telehealth: Payer: Self-pay

## 2016-12-10 NOTE — Telephone Encounter (Signed)
rtc to pt, she is upset due to extreme discomfort of the vulvovaginal area, she called ob-gyn clinic and was told she would not be seen most probably until December, she states she is "very raw" and cannot stand anymore.  Have spoken to [facility]. And will see if more immediate appt is possible

## 2016-12-10 NOTE — Telephone Encounter (Signed)
Would like [name] to call back.  

## 2016-12-11 ENCOUNTER — Telehealth: Payer: Self-pay | Admitting: Internal Medicine

## 2016-12-11 NOTE — Telephone Encounter (Signed)
Pt's sister calls for info for pt going into assisted living, she states pt is agreeable at this time but may not go through with it, they will start looking for a facility that pt likes and is close by for sister to continue to provide support. Just an FYI at this time

## 2016-12-11 NOTE — Telephone Encounter (Signed)
Patient sister is wanting to speak to [facility] regarding possibly getting an assisted living home for patient

## 2016-12-18 ENCOUNTER — Telehealth: Payer: Self-pay | Admitting: *Deleted

## 2016-12-18 NOTE — Telephone Encounter (Addendum)
Pt calls and states recently her cbg readings have been low from 60 to 130, she states she is asymptomatic and this is usually fasting for up to 12 hours. Today it was 60, states she feels fine, and is now having a triple chocolate muffin, milk and coffee. She will test again after 1 hour and call results back, cbg was 176 She is ask to keep a record and to call 911 if needed. She is agreeable

## 2016-12-18 NOTE — Telephone Encounter (Signed)
Returned call.  She has had lower sugars 80-110's over last three days.  She has held a few doses of novolog and has lowered the other doses that she has been taken (16 units).  She knows to lower novolog even further if her sugars remain below 120.

## 2016-12-22 ENCOUNTER — Ambulatory Visit: Payer: PPO

## 2016-12-22 ENCOUNTER — Telehealth: Payer: Self-pay | Admitting: Internal Medicine

## 2016-12-22 NOTE — Telephone Encounter (Signed)
Pt fell on Saturday and hurt her Left side which is hurting really bad.  Sch patient for today @ 1:15pm but, her ride is unable to bring her today.  Patient sch appointment for tomorrow @ 2:45pm and would like to know what she can do in the meantime until  She she can get to her appointment.

## 2016-12-22 NOTE — Telephone Encounter (Signed)
Pt states she has fallen or as she states almost fallen 3 times,  where she catches herself before she hits the floor, one of the falls happen when she was rising from the recliner to go to bed, she states she didn't really realize she was up and "kinda" stumbled she caught herself but her L mid side went into the arm of the chair, this is the side that causes her pain on a regular basis, states dr Eppie Gibson has examined it several times and cannot find a reason for the pain. One of the falls she ended up on her knees  And the other she caught herself before injury. She states her back pain is becoming more limiting to her activity at home.  She had made an appt for 11/13 but has now cancelled it stating if she cannot see dr Eppie Gibson she does not want to come to clinic. She was offered a Thursday 11/15 pm [facility] appt where she was informed dr Eppie Gibson would be attending but would not be able to spend a great amount of time due to his attending status but would have great input on treatment, she refuses this appt stating she needs a good deal of time to see him. She is ask to call back if she changes her mind and an [facility] appt will be given. She does ask that dr Eppie Gibson call her soon Her next appt w/ dr Eppie Gibson is scheduled for 12/3 at 1115 this was moved up from 12/14

## 2016-12-23 ENCOUNTER — Ambulatory Visit: Payer: PPO

## 2016-12-23 NOTE — Telephone Encounter (Signed)
I called Ms. Pensinger.  She states the pain today is tolerable and she has not required any pain medication.  She does not believe she needs to be seen and is satisfied with the scheduled appointment for 12/3.  With regards to the falls, she denies any orthostatic symptoms, new weakness, dizziness, or vertigo.  She fell with standing, but also fell out of her chair while sleeping.  She is currently asymptomatic.  We will reassess at follow-up.

## 2016-12-25 ENCOUNTER — Ambulatory Visit: Payer: PPO | Admitting: Dietician

## 2016-12-25 ENCOUNTER — Ambulatory Visit: Payer: PPO | Admitting: Pharmacist

## 2016-12-29 ENCOUNTER — Other Ambulatory Visit: Payer: Self-pay | Admitting: Obstetrics and Gynecology

## 2016-12-29 DIAGNOSIS — N9089 Other specified noninflammatory disorders of vulva and perineum: Secondary | ICD-10-CM | POA: Diagnosis not present

## 2016-12-29 DIAGNOSIS — N898 Other specified noninflammatory disorders of vagina: Secondary | ICD-10-CM | POA: Diagnosis not present

## 2016-12-29 DIAGNOSIS — B372 Candidiasis of skin and nail: Secondary | ICD-10-CM | POA: Diagnosis not present

## 2016-12-31 ENCOUNTER — Other Ambulatory Visit: Payer: Self-pay | Admitting: Internal Medicine

## 2016-12-31 ENCOUNTER — Telehealth: Payer: Self-pay | Admitting: *Deleted

## 2016-12-31 ENCOUNTER — Telehealth: Payer: Self-pay | Admitting: Pharmacist

## 2016-12-31 DIAGNOSIS — F419 Anxiety disorder, unspecified: Secondary | ICD-10-CM

## 2016-12-31 NOTE — Telephone Encounter (Signed)
[facility] calls and states they need to speak w/ dr groce r/t coumadin and meds they plan to prescribe, per dr groce cell# given for them to call directly

## 2016-12-31 NOTE — Telephone Encounter (Signed)
Called by the office of [facility] who wishes to put patient on 1 dose of Diflucan today--followed by repeat (one time dose) again in 3 days. I spoke with the patient and she recorded and read back the following instructions:  Today, 21-NOV-18---you will TAKE your Diflucan tablet as directed by Dr. Murrell Redden; you will OMIT your warfarin today. Tomorrow, 22-NOV-18 (Thanksgiving day) you will OMIT your warfarin; Friday, 23-NOV-18 you will TAKE your regularly scheduled dose of warfarin. Saturday, 24-NOV-18 you will TAKE YOUR Diflucan, and OMIT your warfarin. Sunday, 25-NOV-18, you will OMIT your warfarin. Monday, 26-NOV-18, you will return to the [facility] for an INR test. She repeated these instructions back to me.

## 2017-01-01 ENCOUNTER — Other Ambulatory Visit: Payer: Self-pay | Admitting: Internal Medicine

## 2017-01-05 ENCOUNTER — Ambulatory Visit: Payer: PPO | Admitting: Pharmacist

## 2017-01-05 DIAGNOSIS — I480 Paroxysmal atrial fibrillation: Secondary | ICD-10-CM | POA: Diagnosis not present

## 2017-01-05 DIAGNOSIS — Z7901 Long term (current) use of anticoagulants: Secondary | ICD-10-CM

## 2017-01-05 LAB — POCT INR: INR: 1.2

## 2017-01-05 NOTE — Progress Notes (Signed)
Anticoagulation Management [name] is a 66 y.o. female who reports to the clinic for monitoring of warfarin treatment.    Indication: atrial fibrillation   Duration: indefinite Supervising physician: [facility] Visit History: Patient does not report signs/symptoms of bleeding or thromboembolism  Other recent changes: No diet, medications, lifestyle changes endorsed by the patient to me other than as noted in patient findings.  Anticoagulation Episode Summary    Current INR goal:   1.5-2.5  TTR:   80.5 % (3.8 y)  Next INR check:   01/05/2017  INR from last check:   1.20! (01/05/2017)  Weekly max warfarin dose:     Target end date:     INR check location:   [facility]  Preferred lab:     Send INR reminders to:   [facility]   Indications   Paroxysmal atrial fibrillation ([place]) [I48.0] Long term current use of anticoagulant therapy [Z79.01]       Comments:         Anticoagulation Care Providers    Provider Role Specialty Phone number   Lelon Perla, MD  Cardiology [phone]      Allergies  Allergen Reactions  . Lorazepam Other (See Comments)    Patient's sister noted that ativan caused the patient to become extremely confused during hospitalization 09/2010; tolerates Xanax  . Morphine And Related Other (See Comments)    Injection site reaction  . Oxycontin [Oxycodone] Other (See Comments)    headache  . Tramadol Hcl Swelling    Ankle swelling   Prior to Admission medications   Medication Sig Start Date End Date Taking? Authorizing Provider  albuterol (PROVENTIL HFA;VENTOLIN HFA) 108 (90 Base) MCG/ACT inhaler Inhale 2 puffs into the lungs every 6 (six) hours as needed for wheezing or shortness of breath. 12/03/16  Yes [name], MD  ALPRAZolam [name]) 1 MG tablet TAKE 1/2 TO 1 TABLET BY MOUTH TWICE DAILY AS NEEDED FOR ANXIETY 07/17/16  Yes [name], MD  BD ULTRA-FINE MICRO PEN NEEDLE 32G X 6 MM  MISC USE TO INJECT INSULIN FID 11/19/16  Yes [provider]  Blood Glucose Monitoring Suppl (ONETOUCH VERIO) W/DEVICE KIT 1 each by Does not apply route 4 (four) times daily. 07/25/14  Yes [name], MD  buPROPion (WELLBUTRIN) 75 MG tablet Take 1 tablet (75 mg total) by mouth 2 (two) times daily. 12/05/16  Yes [name], MD  chlorpheniramine (CHLOR-TRIMETON) 4 MG tablet Take 1 tablet (4 mg total) by mouth 3 (three) times daily as needed for allergies. 10/06/14  Yes [name], MD  cyclobenzaprine (FLEXERIL) 5 MG tablet Take 1 tablet (5 mg total) by mouth 3 (three) times daily as needed for muscle spasms. 01/11/16  Yes [name], MD  DULoxetine (CYMBALTA) 60 MG capsule Take 1 capsule (60 mg total) by mouth daily. 12/05/16  Yes [name], MD  ferrous gluconate (FERGON) 324 MG tablet Take 1 tablet (324 mg total) by mouth 2 (two) times daily with a meal. 12/05/16  Yes [name], MD  fluconazole (DIFLUCAN) 150 MG tablet Take 1 tablet (150 mg total) by mouth once as needed. 05/16/16  Yes [name], MD  fluticasone (FLONASE) 50 MCG/ACT nasal spray Place 2 sprays into both nostrils daily. 12/05/16  Yes [name], MD  Fluticasone-Salmeterol (ADVAIR DISKUS) 500-50 MCG/DOSE AEPB Inhale 1 puff into the lungs 2 (two) times daily. 11/06/16  Yes [name], MD  furosemide (LASIX) 80 MG tablet Take 1  tab in AM and 1/2 tab at 3 pm 08/25/16  Yes [name], MD  gabapentin (NEURONTIN) 300 MG capsule Take 2 capsules (600 mg total) by mouth 3 (three) times daily. 10/09/16  Yes [name], MD  glucose blood (ONETOUCH VERIO) test strip 1 each by Other route QID. Use as instructed 11/12/16  Yes [name], MD  HYDROcodone-acetaminophen (NORCO/VICODIN) 5-325 MG tablet Take 1-2 tablets by mouth every 6 (six) hours as needed for severe pain. 12/05/16  Yes [name], MD  insulin aspart (NOVOLOG) 100 UNIT/ML FlexPen Inject 18-28 Units into the skin 3 (three)  times daily with meals. 10/16/16  Yes [name], MD  Insulin Detemir (LEVEMIR) 100 UNIT/ML Pen USE 37 UNITS TWICE DAILY 08/06/16  Yes [name], MD  Insulin Syringe-Needle U-100 31G X 15/64" 0.5 ML MISC Use to inject insulin up to 4 times a day 04/17/16  Yes [name], MD  Lancets Misc. (ACCU-CHEK FASTCLIX LANCET) KIT Check your blood 4 times a day dx code 250.00 insulin requiring 01/19/13  Yes [name], MD  metoprolol succinate (TOPROL-XL) 25 MG 24 hr tablet Take 1 tablet (25 mg total) by mouth daily. 06/11/16  Yes [name], MD  nystatin (MYCOSTATIN/NYSTOP) powder Apply topically 4 (four) times daily. 12/05/16  Yes [name], MD  omeprazole (PRILOSEC) 40 MG capsule Take 1 capsule (40 mg total) by mouth 2 (two) times daily. 07/17/16  Yes [name], MD  OXYGEN Inhale 2 L into the lungs continuous.   Yes [provider]  potassium chloride SA (K-DUR,KLOR-CON) 20 MEQ tablet Take 1 tablet (20 mEq total) by mouth 2 (two) times daily. 08/25/16  Yes [name], MD  promethazine (PHENERGAN) 12.5 MG tablet Take 1 tablet (12.5 mg total) by mouth every 8 (eight) hours as needed for nausea or vomiting. 07/13/15  Yes [name], DO  rosuvastatin (CRESTOR) 20 MG tablet Take 1 tablet (20 mg total) by mouth at bedtime. 08/14/16  Yes [name], MD  tiotropium (SPIRIVA HANDIHALER) 18 MCG inhalation capsule Place 1 capsule (18 mcg total) into inhaler and inhale daily. 08/14/16  Yes [name], MD  warfarin (COUMADIN) 2.5 MG tablet TAKE 1 TABLET BY MOUTH DAILY AT 6 PM EXCEPT ON MONDAYS 11/06/16  Yes [name], MD   Past Medical History:  Diagnosis Date  . Adenomatous polyps 05/14/2011   Colonoscopy (05/2011): 4 mm adenomatous polyp excised endoscopically Colonoscopy (02/2002): Adenomatous polyp excised endoscopically   . Allergic rhinitis 06/01/2012  . Anemia of chronic disease 01/01/2013  . Anxiety 07/24/2010  . Aortic atherosclerosis ([place]) 10/19/2014    Seen on CT scan, currently asymptomatic  . 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 Asymptomatic cholelithiasis 09/25/2015   Seen on CT scan 08/2015  . Bilateral cataracts 08/04/2012   Visually insignificant   . Carotid artery stenosis    s/p right endarterectomy (06/2010) Carotid US (07/2010):  Left: Moderate-to-severe (60-79%) calcific and non-calcific plaque origin and proximal ICA and ECA   . Chronic congestive heart failure with left ventricular diastolic dysfunction ([place]) 10/21/2010  . Chronic constipation 02/03/2011  . Chronic daily headache 01/16/2014  . Chronic low back pain 10/06/2012  . Chronic venous insufficiency 08/04/2012  . Clear cell renal cell carcinoma ([place]) 07/21/2011   s/p cryoablation of left RCC in 09/2011 by [name]. Followed by [name]  [facility]) .    Marland Kitchen COPD (chronic obstructive pulmonary disease) with emphysema (  [place])    PFTs 02/2012: FEV1 0.92 (40%), ratio 69, 27% increase in FEV1 with BD, TLC 91%, severe airtrapping, DLCO49% On chronic home O2. Pulmonary rehab referral 05/2012   . Depression 11/19/2005  . Esophageal stenosis 08/08/2015   Mild, benign-appearing on EGD 06/29/2015  . Fibromyalgia 08/29/2010  . Gastroesophageal reflux disease   . History of blood transfusion    "several times"   . Hyperlipidemia LDL goal < 100 11/20/2005  . Internal hemorrhoids 08/04/2012  . Mitral stenosis    s/p Mitral valve replacement with a 27-mm pericardial porcine valve (Medtronic Mosaic valve, serial #[id number] on 09/20/10, Dr. Prescott Gum)   . Moderate to severe pulmonary hypertension ([place])    2014 TEE w PA peak pressure 46 mmHg, s/p MV replacement   . Moderately severe major depression ([place]) 11/19/2005  . Obesity (BMI 30.0-34.9) 10/23/2011  . Obstructive sleep apnea    Nocturnal polysomnography (06/2009): Moderate sleep apnea/ hypopnea syndrome , AHI 17.8 per hour with  nonpositional hypopneas. CPAP titration to 12 CWP, AHI 2.4 per hour. On nocturnal CPAP via a small resMed Quattro full-face mask with heated humidifier.   . Osteoporosis    DEXA (12/09/2011): L-spine T -3.7, left hip T -1.4 DEXA (12/2004): L-spine T -2.6, left hip -0.1   . Paroxysmal atrial fibrillation ([place]) 10/22/2010   s/p Left atrial maze procedure for paroxysmal atrial fibrillation on 09/20/2010 by Dr Prescott Gum.  Subsequent splenic infarct, decision was made to re-anticoagulate with coumadin, likely life-long as this is the most likely cause of the splenic infarct.   . Pulmonary hypertension due to chronic obstructive pulmonary disease ([place]) 04/25/2016  . Right nephrolithiasis 09/06/2014   5 mm non-obstructing calculus seen on CT scan 09/05/2014   . Seborrheic keratosis 09/28/2015  . Shortness of breath dyspnea   . Tobacco abuse 07/28/2012  . Type 2 diabetes mellitus with diabetic neuropathy ([facility])    Social History   Socioeconomic History  . Marital status: Divorced    Spouse name: Not on file  . Number of children: Not on file  . Years of education: Not on file  . Highest education level: Not on file  Social Needs  . Financial resource strain: Not on file  . Food insecurity - worry: Not on file  . Food insecurity - inability: Not on file  . Transportation needs - medical: Not on file  . Transportation needs - non-medical: Not on file  Occupational History  . Not on file  Tobacco Use  . Smoking status: Current Every Day Smoker    Packs/day: 1.00    Years: 53.00    Pack years: 53.00    Types: Cigarettes    Start date: 11/01/1962  . Smokeless tobacco: Never Used  . Tobacco comment: 1/2 to 1 pack per day  Substance and Sexual Activity  . Alcohol use: No    Alcohol/week: 0.0 oz  . Drug use: No  . Sexual activity: No    Birth control/protection: Post-menopausal  Other Topics Concern  . Not on file  Social History Narrative   Lives alone in [place] with 60  pound chow   Worked at [employer] for 18 years   No car   Family History  Problem Relation Age of Onset  . Peptic Ulcer Disease Father   . Heart attack Father 19       Died of MI at age 39  . Heart attack Brother 50       Died of  MI at age 98  . Obesity Brother   . Pneumonia Mother   . Healthy Sister   . Lupus Daughter   . Obsessive Compulsive Disorder Daughter     ASSESSMENT Recent Results: The most recent result is correlated with 15 mg per week--but with a requirement to OMIT 2 days of dosing after 2 cyclic (every 3rd day x 2 doses) doses of fluconazole prescribed by her OB-GYN: Lab Results  Component Value Date   INR 1.20 01/05/2017   INR 2.2 12/05/2016   INR 2.5 11/05/2016    Anticoagulation Dosing: Description   Take ONE-HALF (1/2) tablet of your CURRENT 761m peach colored warfarin tablet by mouth, once-daily at 6PM all days of week, EXCEPT on Mondays. Do NOT take any warfarin on Mondays of each week.      INR today: Subtherapeutic  PLAN Weekly dose was unchanged,i.e. She was placed back on her usual regimen of 172mtotal per week.   Patient Instructions  Patient instructed to take medications as defined in the Anti-coagulation Track section of this encounter.  Patient instructed to OMIT doses on each Monday of each week.  Patient instructed to take ONE-HALF (1/2) tablet of your CURRENT 61m18meach colored warfarin tablet by mouth, once-daily at 6PM all days of week, EXCEPT on Mondays. Do NOT take any warfarin on Mondays of each week.  Patient verbalized understanding of these instructions.     Patient advised to contact clinic or seek medical attention if signs/symptoms of bleeding or thromboembolism occur.  Patient verbalized understanding by repeating back information and was advised to contact me if further medication-related questions arise. Patient was also provided an information handout.  Follow-up Return in 3 weeks (on 01/26/2017) for Follow up INR  at 1130h.  JamPennie BanterPharmD, CACP, CPP 15 minutes spent face-to-face with the patient during the encounter. 50% of time spent on education. 50% of time was spent on fingerstick point of care INR sample collection, processing, results determination and documentation in [email or web address]

## 2017-01-05 NOTE — Telephone Encounter (Signed)
rx phoned into pharmacy.Despina Hidden Cassady11/26/20184:55 PM

## 2017-01-05 NOTE — Patient Instructions (Signed)
Patient instructed to take medications as defined in the Anti-coagulation Track section of this encounter.  Patient instructed to OMIT doses on each Monday of each week.  Patient instructed to take ONE-HALF (1/2) tablet of your CURRENT 27m peach colored warfarin tablet by mouth, once-daily at 6PM all days of week, EXCEPT on Mondays. Do NOT take any warfarin on Mondays of each week.  Patient verbalized understanding of these instructions.

## 2017-01-05 NOTE — Progress Notes (Signed)
I reviewed Dr. Gladstone Pih note.  Patient is on coumadin for Afib.  She recently held doses due to being on an anti-fungal and INR is low.  She resumed her previous dose of coumadin today per documentation.

## 2017-01-11 IMAGING — CT CT ABDOMEN WO/W CM
2 of 15 series · 14 of 47 positions shown, 19 images · IV contrast (OMNIPAQUE)
Comparison: CT [DATE]/ 4296

CLINICAL DATA: Renal ablation 3 years prior.  Left renal carcinoma.

EXAM:
CT ABDOMEN WITHOUT AND WITH CONTRAST
TECHNIQUE: Multidetector CT imaging of the abdomen was performed following the
standard protocol before and following the bolus administration of
intravenous contrast.
CONTRAST:  100mL OMNIPAQUE IOHEXOL 300 MG/ML  SOLN

[Series 2: renal w/o · axial · non-contrast · 0.93mm/px · z∈[-386,-158]mm · 8 of 98 slices shown]
[im 11/98  soft-tissue]
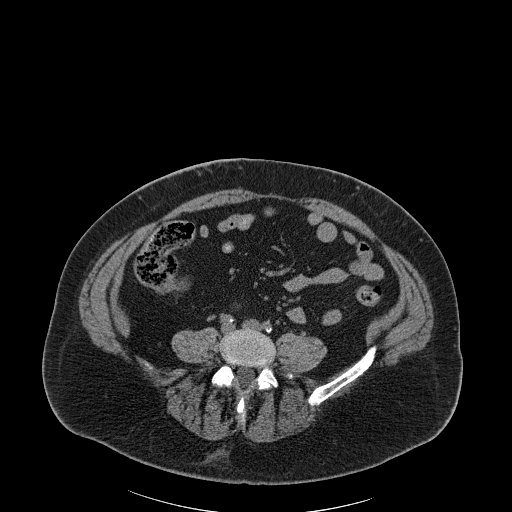
[im 22/98  soft-tissue]
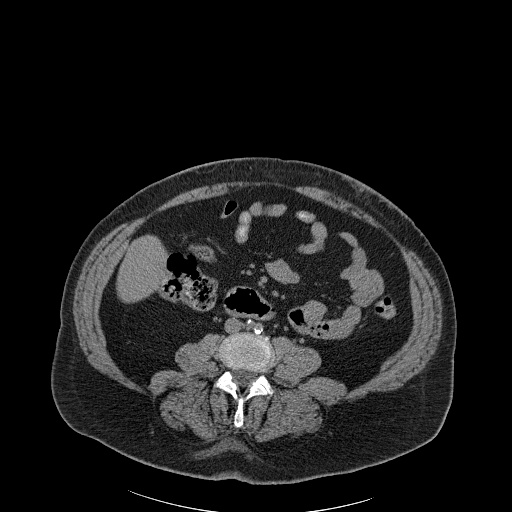
[im 33/98  soft-tissue]
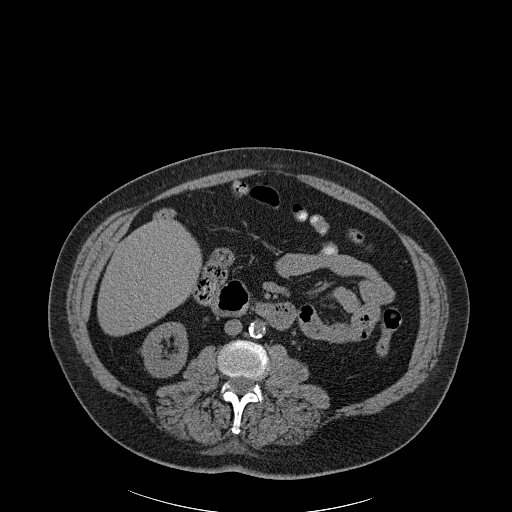
[im 44/98  soft-tissue]
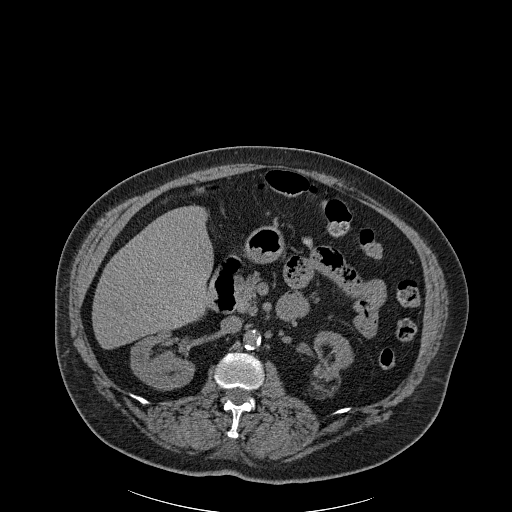
[im 54/98  soft-tissue]
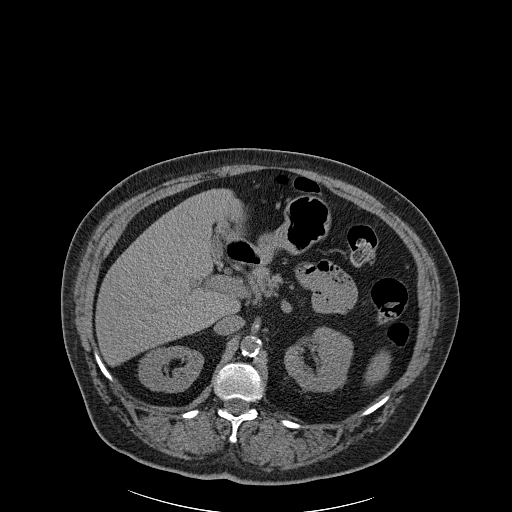
[im 65/98  soft-tissue]
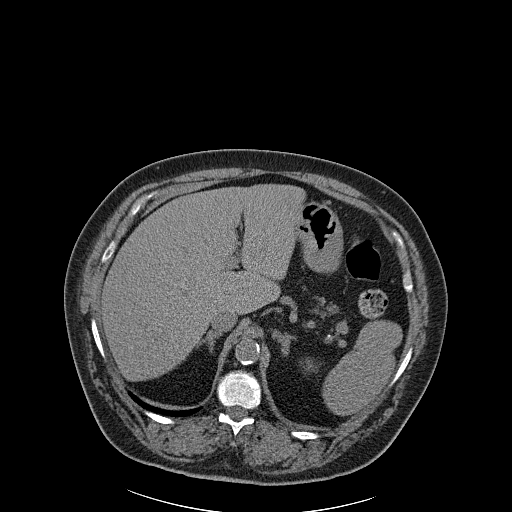
[im 76/98  soft-tissue]
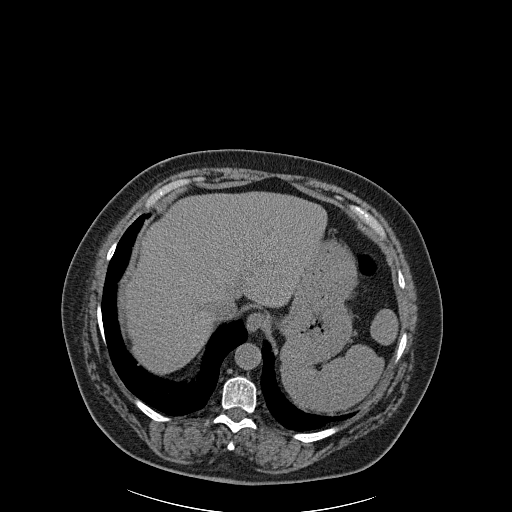
[im 87/98  soft-tissue]
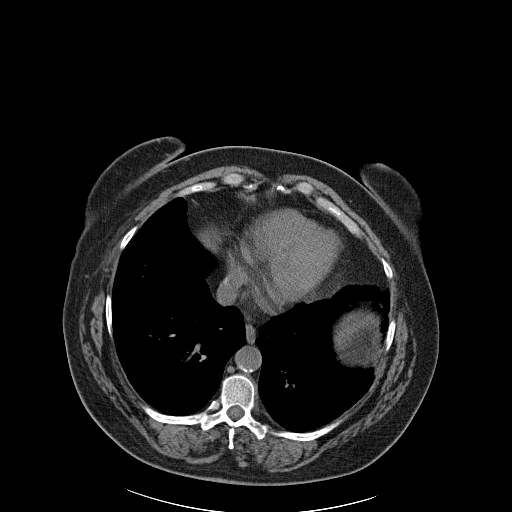

[Series 6: venous · axial · portal-venous · 0.93mm/px · z∈[-370,-174]mm · 6 of 91 slices shown, 11 images]
[im 13/91  soft-tissue]
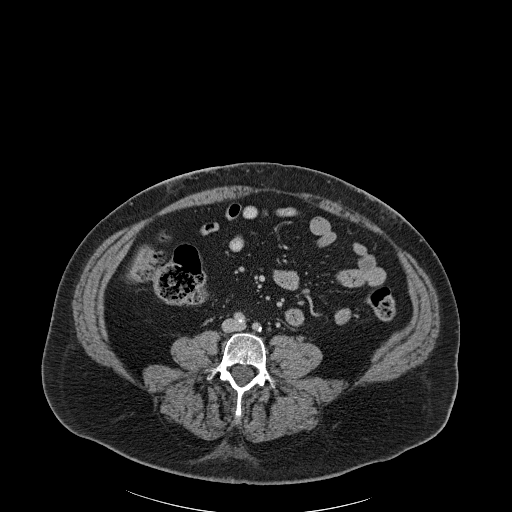
[im 13/91  bone]
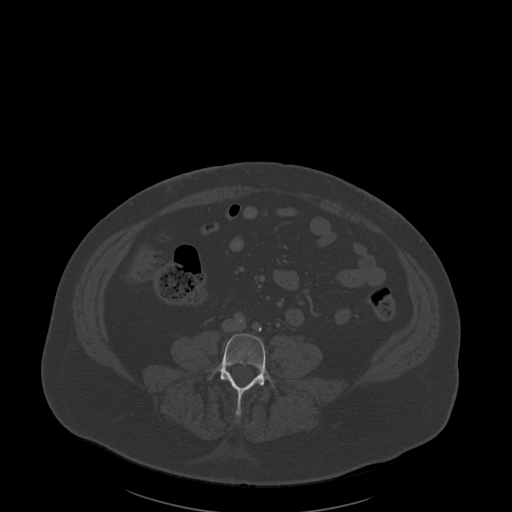
[im 26/91  soft-tissue]
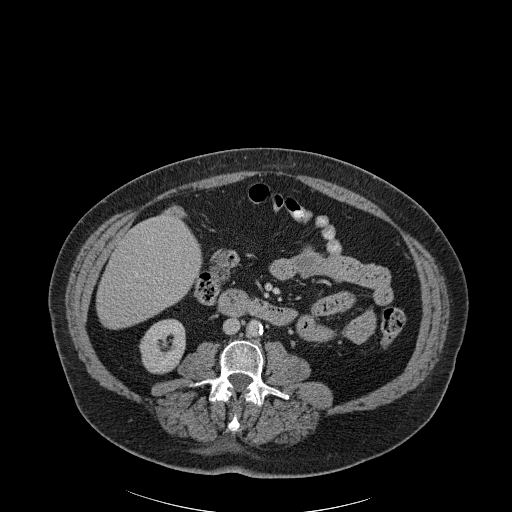
[im 39/91  soft-tissue]
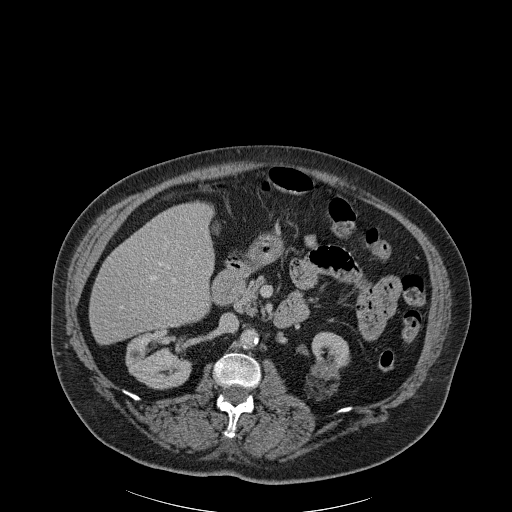
[im 39/91  lung]
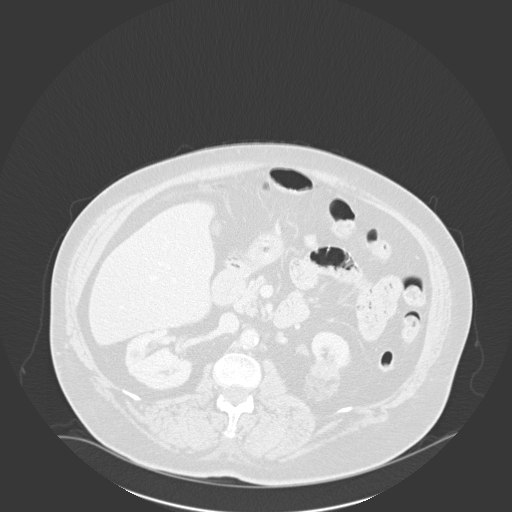
[im 52/91  soft-tissue]
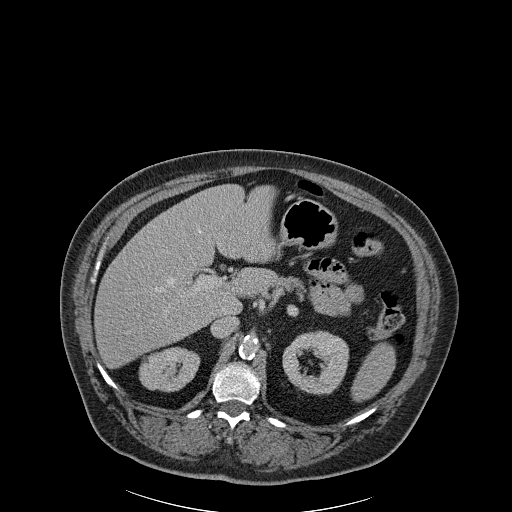
[im 52/91  lung]
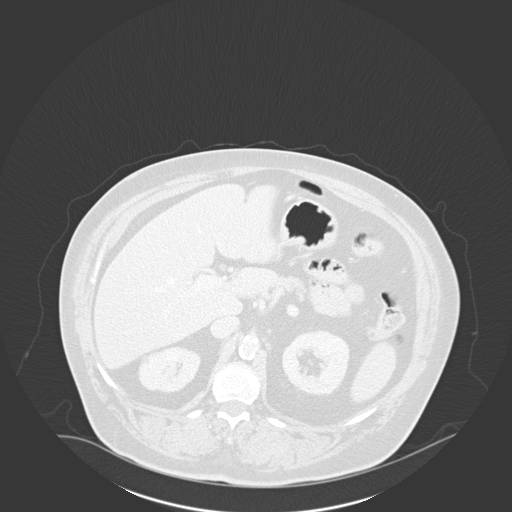
[im 65/91  soft-tissue]
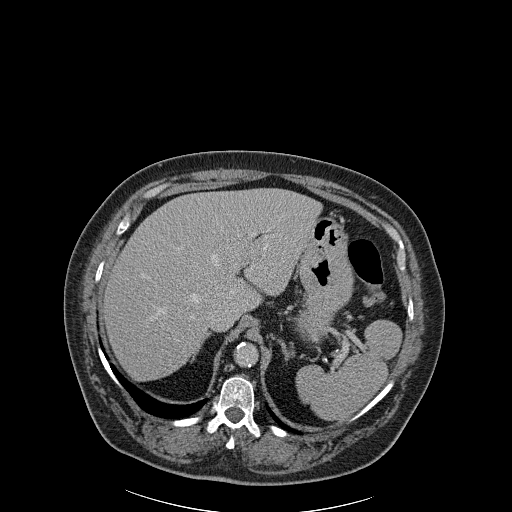
[im 65/91  lung]
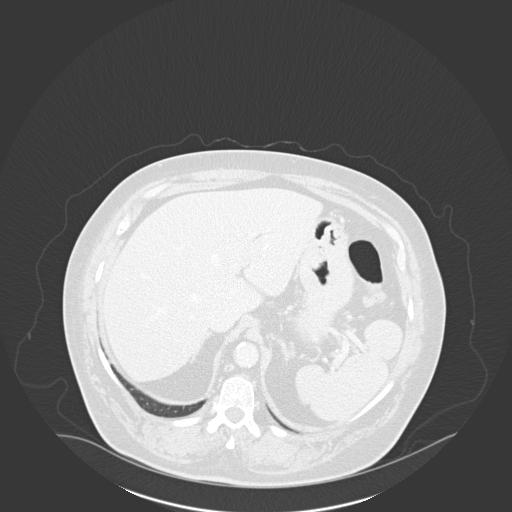
[im 78/91  soft-tissue]
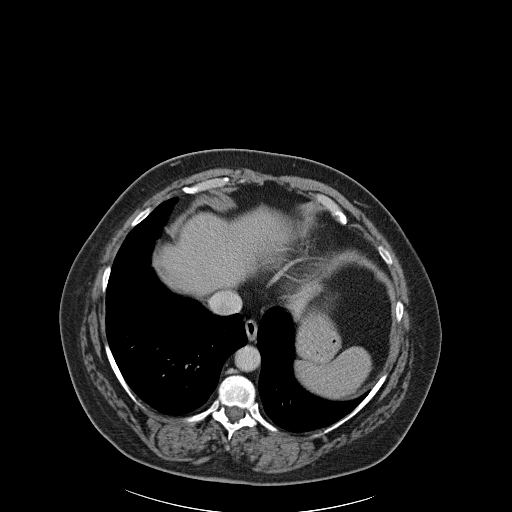
[im 78/91  lung]
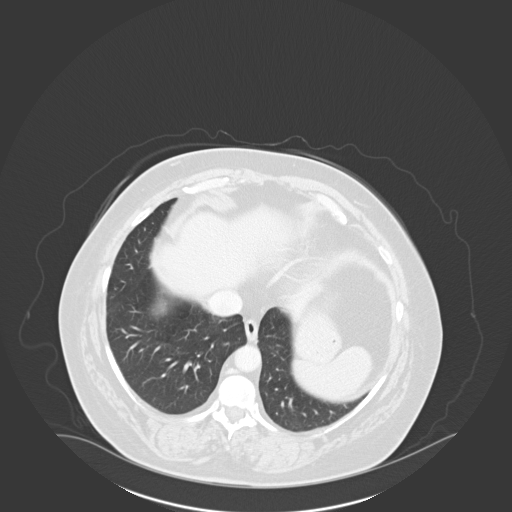

[14 of 47 positions shown; findings below may reference images not displayed]

FINDINGS: Lower chest: Lung bases are clear.

Hepatobiliary: No focal hepatic lesion. No biliary duct dilatation.
Gallbladder is normal. Common bile duct is normal.

Pancreas: Pancreas is normal. No ductal dilatation. No pancreatic
inflammation.

Spleen: Normal spleen

Adrenals/urinary tract: Mild thickening of the adrenal glands
unchanged.

Ablation site posterior to the lower pole of the left kidney is
unchanged. There is a stable rounded focus of fatty change at the
treatment site which is similar comparison exam. There is soft
tissue at the treatment site measuring 2.5 by 1.4 cm which compares
to 2.7 by 1.5 cm on CT of [DATE]. This tissue demonstrates no
significant post-contrast enhancement.

No new lesions within the left kidney. The right kidney has a
nonobstructing 5 mm calculus.

Stomach/Bowel: The stomach and limited view of the small bowel and
colon are unremarkable.

Vascular/Lymphatic: Abdominal aorta is normal caliber with
atherosclerotic calcification. There is no retroperitoneal or
periportal lymphadenopathy.

Musculoskeletal: No aggressive osseous lesion.

Other: No free fluid.
IMPRESSION: 1. Stable cryoablation site along lower pole left kidney without
evidence of local recurrence.
2. Small right renal calculus.
3. Atherosclerotic calcification aorta.

## 2017-01-12 ENCOUNTER — Ambulatory Visit: Payer: PPO | Admitting: Internal Medicine

## 2017-01-12 ENCOUNTER — Encounter: Payer: Self-pay | Admitting: Internal Medicine

## 2017-01-12 ENCOUNTER — Ambulatory Visit: Payer: PPO | Admitting: Pharmacist

## 2017-01-12 VITALS — BP 134/45 | HR 71 | Temp 98.4°F | Wt 193.7 lb

## 2017-01-12 DIAGNOSIS — I5032 Chronic diastolic (congestive) heart failure: Secondary | ICD-10-CM

## 2017-01-12 DIAGNOSIS — F322 Major depressive disorder, single episode, severe without psychotic features: Secondary | ICD-10-CM | POA: Diagnosis not present

## 2017-01-12 DIAGNOSIS — I700 Atherosclerosis of aorta: Secondary | ICD-10-CM

## 2017-01-12 DIAGNOSIS — D638 Anemia in other chronic diseases classified elsewhere: Secondary | ICD-10-CM

## 2017-01-12 DIAGNOSIS — N904 Leukoplakia of vulva: Secondary | ICD-10-CM

## 2017-01-12 DIAGNOSIS — R1310 Dysphagia, unspecified: Secondary | ICD-10-CM | POA: Diagnosis not present

## 2017-01-12 DIAGNOSIS — F419 Anxiety disorder, unspecified: Secondary | ICD-10-CM

## 2017-01-12 DIAGNOSIS — M797 Fibromyalgia: Secondary | ICD-10-CM

## 2017-01-12 DIAGNOSIS — F17210 Nicotine dependence, cigarettes, uncomplicated: Secondary | ICD-10-CM | POA: Diagnosis not present

## 2017-01-12 DIAGNOSIS — M545 Low back pain: Secondary | ICD-10-CM

## 2017-01-12 DIAGNOSIS — Z0000 Encounter for general adult medical examination without abnormal findings: Secondary | ICD-10-CM

## 2017-01-12 DIAGNOSIS — Z720 Tobacco use: Secondary | ICD-10-CM

## 2017-01-12 DIAGNOSIS — G8929 Other chronic pain: Secondary | ICD-10-CM

## 2017-01-12 HISTORY — DX: Leukoplakia of vulva: N90.4

## 2017-01-12 MED ORDER — KETOROLAC TROMETHAMINE 30 MG/ML IJ SOLN
30.0000 mg | Freq: Once | INTRAMUSCULAR | Status: AC
  Administered 2017-01-12: 30 mg via INTRAMUSCULAR

## 2017-01-12 MED ORDER — DULOXETINE HCL 60 MG PO CPEP
60.0000 mg | ORAL_CAPSULE | Freq: Two times a day (BID) | ORAL | 11 refills | Status: DC
Start: 2017-01-12 — End: 2017-12-10

## 2017-01-12 MED ORDER — KETOROLAC TROMETHAMINE 30 MG/ML IJ SOLN: 30.0000 mg | Freq: Once | INTRAMUSCULAR | Status: DC

## 2017-01-12 NOTE — Assessment & Plan Note (Signed)
She is up-to-date on her health care maintenance with the exception of a urine for microalbumin which we will obtain at the follow-up visit.

## 2017-01-12 NOTE — Assessment & Plan Note (Signed)
Assessment  She has anemia of chronic disease. In addition, she has GI AVMs that may be a source of her anemia as well as. She has asked for a repeat CBC to be drawn today to reassess if she has worsening anemia. Her only complaint at this point is increasing fatigue.  Plan  A CBC was drawn at this visit and is pending at the time of this dictation.

## 2017-01-12 NOTE — Progress Notes (Signed)
Subjective:    Patient ID: [name], female    DOB: [birth year], 66 y.o.   MRN: [id number]  HPI  [name] is here for follow-up of her chronic low back pain, depression, fibromyalgia, chronic systolic heart failure, obesity, tobacco abuse, and lichen sclerosus. Please see the A&P for the status of the pt's chronic medical problems.  Review of Systems  Constitutional: Positive for fatigue. Negative for activity change, appetite change and unexpected weight change.  Respiratory: Positive for shortness of breath. Negative for chest tightness and wheezing.   Cardiovascular: Positive for leg swelling. Negative for chest pain and palpitations.  Gastrointestinal: Negative for abdominal pain.  Musculoskeletal: Positive for arthralgias, back pain and gait problem. Negative for joint swelling and myalgias.  Neurological: Negative for dizziness, syncope, weakness and light-headedness.  Psychiatric/Behavioral: Positive for depression, dysphoric mood and sleep disturbance. Negative for suicidal ideas. The patient is nervous/anxious.       Objective:   Physical Exam  Constitutional: She is oriented to person, place, and time. She appears well-developed and well-nourished. No distress.  Appears fatigued  HENT:  Head: Normocephalic and atraumatic.  Eyes: Conjunctivae are normal. Right eye exhibits no discharge. Left eye exhibits no discharge. No scleral icterus.  Neck: Normal range of motion. Neck supple. No tracheal deviation present. No thyromegaly present.  Pulmonary/Chest: No stridor.  Musculoskeletal: Normal range of motion. She exhibits edema. She exhibits no tenderness or deformity.  Lymphadenopathy:    She has no cervical adenopathy.  Neurological: She is alert and oriented to person, place, and time. She exhibits normal muscle tone.  Skin: Skin is warm and dry. No rash noted. She is not diaphoretic. No erythema. There is pallor.  Psychiatric: She has a normal mood and  affect. Her behavior is normal. Judgment and thought content normal.  Nursing note and vitals reviewed.     Assessment & Plan:   Please see problem oriented charting.

## 2017-01-12 NOTE — Assessment & Plan Note (Signed)
Assessment  She lost just over 2 pounds since the last clinic visit. She admits to eating smaller portions and has had no trouble doing so. Despite this weight loss her BMI remains above 35 and she has multiple core morbidities because of the obesity including diabetes, hypertension, and symptomatic osteoarthritis.  Plan  She was encouraged to continue with the lifestyle modification of decreasing her portion sizes as this should help control several of her chronic medical conditions. We will reassess the efficacy of these lifestyle changes on her weight at the follow-up visit.

## 2017-01-12 NOTE — Assessment & Plan Note (Signed)
Assessment  Subjectively she feels her fibromyalgia pain has improved with the initiation of Cymbalta 60 mg by mouth daily. She is requesting we increase this dose.  Plan  We will increase his Cymbalta to 60 mg by mouth twice daily and reassess the efficacy in controlling her fibromyalgia discomfort at follow-up visit.

## 2017-01-12 NOTE — Assessment & Plan Note (Signed)
Assessment  She continues to smoke and is fully aware of how this has negatively impacted her chronic medical conditions. She truly wishes that she was mentally ready to quit but she is unable at this time.  Plan  We will reassess if she is moved beyond the pre-contemplative stage with regards to tobacco cessation at the follow-up visit.

## 2017-01-12 NOTE — Patient Instructions (Addendum)
It was good to see you again.  I am sorry you are still not feeling well.  1) Keep taking your medications as you are.  I formally stopped the flexeril since you were not taking it.  2) I increased your Cymbalta to 60 mg twice daily for the pain.  3) We gave you a ketorolac shot 30 mg once for pain.  4) I drew a complete blood count today to see if you may be more anemic.  5) I will order a barium swallow to look for a Zenker's diverticulum.  I will see you back in three months, sooner if necessary.

## 2017-01-12 NOTE — Assessment & Plan Note (Signed)
Assessment  She has been on alprazolam for countless years. She states this is effectively managing her anxiety.  Plan  We will continue the alprazolam at 0.5 mg to 1 mg twice daily as needed for anxiety. We will reassess efficacy of this therapy at the follow-up visit.

## 2017-01-12 NOTE — Assessment & Plan Note (Signed)
Assessment  She states recently she has noted what appears to be undigested food that reoccurs in her mouth. This is included pieces of potato chips, corn, and alignment being. Much of this occurs after lying down or falling asleep. The symptoms are concerning for possible Zenker's diverticulum.  Plan  We will order a barium swallow to assess for a Zenker's diverticulum. Further evaluation and care pending the results of this assessment.

## 2017-01-12 NOTE — Assessment & Plan Note (Signed)
>>  ASSESSMENT AND PLAN FOR CHRONIC CHF (CONGESTIVE HEART FAILURE) (HCC) WRITTEN ON 01/12/2017  2:39 PM BY Doneen Poisson, MD  Assessment  She has not been taking her Lasix recently. The reason is that urination causes severe discomfort with her lichen sclerosis. This has resulted in increased edema of the lower extremities.  Plan  She was encouraged to continue with the treatment for lichen sclerosis and once it is improved to re-Institute her diuretic regimen. We will reassess if she has done this at the follow-up visit.

## 2017-01-12 NOTE — Assessment & Plan Note (Signed)
Assessment  She continues to complain of chronic low back pain which subjectively is worsening. This is despite hydrocodone-acetaminophen and as needed alprazolam. She admits to not using the cyclobenzaprine. There is no lower extremity weakness or numbness or change in bowel or bladder habits. Her renal cell carcinoma does not appear to be recurrent and an MRI of the lumbar spine just over 14 months ago was unremarkable. She noted some possible impact of Cymbalta when this was started recently.  Plan  We will continue the hydrocodone-acetaminophen and as needed alprazolam. We will increase his Cymbalta to 60 mg by mouth twice daily. She will utilize heating pads as an attempt to provide some additional relief. In addition, she was given a dose of ketorolac 30 mg IM 1. She is unable to afford the co-pay associated with physical therapy and has failed to respond to TENS units in the past. We will reassess the efficacy of the pharmacologic regimen and the heating pad at the follow-up visit. The cyclobenzaprine has been removed from her medication list as she is not utilizing it.

## 2017-01-12 NOTE — Assessment & Plan Note (Signed)
Assessment  She was recently switched from Prozac to Cymbalta and Wellbutrin when the Prozac was felt to be ineffective. Subjectively, she states she feels improved with regards to her depression although does continue to sleep a considerable amount of time.  Plan  We will continue the Wellbutrin at 75 mg by mouth daily which is the highest dose she's been able to tolerate in the past. We will increase the Cymbalta to 60 mg by mouth twice daily. We will reassess the efficacy of this therapy in managing her depression symptoms at the follow-up visit.

## 2017-01-12 NOTE — Assessment & Plan Note (Signed)
Assessment  She has no signs or symptoms of claudication.  Plan  We will continue aggressive therapy for her diabetes, hyperlipidemia, and diabetes. We will reassess for evidence of symptomatically aortic atherosclerotic disease at the follow-up visit.

## 2017-01-12 NOTE — Assessment & Plan Note (Signed)
Assessment  She has not been taking her Lasix recently. The reason is that urination causes severe discomfort with her lichen sclerosis. This has resulted in increased edema of the lower extremities.  Plan  She was encouraged to continue with the treatment for lichen sclerosis and once it is improved to re-Institute her diuretic regimen. We will reassess if she has done this at the follow-up visit.

## 2017-01-13 DIAGNOSIS — L900 Lichen sclerosus et atrophicus: Secondary | ICD-10-CM | POA: Diagnosis not present

## 2017-01-13 DIAGNOSIS — N9089 Other specified noninflammatory disorders of vulva and perineum: Secondary | ICD-10-CM | POA: Diagnosis not present

## 2017-01-13 DIAGNOSIS — B372 Candidiasis of skin and nail: Secondary | ICD-10-CM | POA: Diagnosis not present

## 2017-01-13 LAB — CBC
Hematocrit: 31.2 % — ABNORMAL LOW (ref 34.0–46.6)
Hemoglobin: 9.7 g/dL — ABNORMAL LOW (ref 11.1–15.9)
MCH: 26.1 pg — ABNORMAL LOW (ref 26.6–33.0)
MCHC: 31.1 g/dL — ABNORMAL LOW (ref 31.5–35.7)
MCV: 84 fL (ref 79–97)
Platelets: 216 10*3/uL (ref 150–379)
RBC: 3.71 x10E6/uL — ABNORMAL LOW (ref 3.77–5.28)
RDW: 17.2 % — ABNORMAL HIGH (ref 12.3–15.4)
WBC: 10.1 10*3/uL (ref 3.4–10.8)

## 2017-01-14 NOTE — Progress Notes (Signed)
Patient ID: [name], female   DOB: August 03, 1950, 66 y.o.   MRN: [id number]  CBC: Hgb 9.7, Hct 31.2, WBC 10.1, MCV 84, Plts 216  Stable anemia of chronic anemia.  Will continue to follow clinically.

## 2017-01-23 ENCOUNTER — Encounter (HOSPITAL_COMMUNITY): Payer: PPO | Admitting: Cardiology

## 2017-01-26 ENCOUNTER — Ambulatory Visit: Payer: PPO | Admitting: Pharmacist

## 2017-01-26 DIAGNOSIS — Z7901 Long term (current) use of anticoagulants: Secondary | ICD-10-CM

## 2017-01-26 DIAGNOSIS — I480 Paroxysmal atrial fibrillation: Secondary | ICD-10-CM | POA: Diagnosis not present

## 2017-01-26 LAB — POCT INR: INR: 2.2

## 2017-01-26 NOTE — Patient Instructions (Signed)
Patient instructed to take medications as defined in the Anti-coagulation Track section of this encounter.  Patient instructed to take today's dose.  Patient instructed to take ONE (1)  tablet of your 2.21m green-colored warfarin tablet by mouth, once-daily at 6PM all days of week, EXCEPT on Mondays. Do NOT take any warfarin on Mondays of each week. Patient verbalized understanding of these instructions.

## 2017-01-26 NOTE — Progress Notes (Signed)
Anticoagulation Management [name] is a 66 y.o. female who reports to the clinic for monitoring of warfarin treatment.    Indication: atrial fibrillation, paroxysmal; long term current use of anticoagulant.  Duration: indefinite Supervising physician: [name]  Anticoagulation Clinic Visit History: Patient does not report signs/symptoms of bleeding or thromboembolism  Other recent changes: No diet, medications, lifestyle changes endorsed by the patient to me at this visit.  Anticoagulation Episode Summary    Current INR goal:   1.5-2.5  TTR:   80.3 % (3.8 y)  Next INR check:   01/26/2017  INR from last check:   2.20 (01/26/2017)  Weekly max warfarin dose:     Target end date:     INR check location:   [facility]  Preferred lab:     Send INR reminders to:   [facility]   Indications   Paroxysmal atrial fibrillation ([place]) [I48.0] Long term current use of anticoagulant therapy [Z79.01]       Comments:         Anticoagulation Care Providers    Provider Role Specialty Phone number   Lelon Perla, MD  Cardiology [phone]      Allergies  Allergen Reactions  . Lorazepam Other (See Comments)    Patient's sister noted that ativan caused the patient to become extremely confused during hospitalization 09/2010; tolerates Xanax  . Morphine And Related Other (See Comments)    Injection site reaction  . Oxycontin [Oxycodone] Other (See Comments)    headache  . Tramadol Hcl Swelling    Ankle swelling   Prior to Admission medications   Medication Sig Start Date End Date Taking? Authorizing Provider  albuterol (PROVENTIL HFA;VENTOLIN HFA) 108 (90 Base) MCG/ACT inhaler Inhale 2 puffs into the lungs every 6 (six) hours as needed for wheezing or shortness of breath. 12/03/16  Yes [name], MD  ALPRAZolam [name]) 1 MG tablet TAKE 1/2 TO 1 TABLET BY MOUTH TWICE DAILY AS NEEDED FOR ANXIETY 01/05/17  Yes [name], MD  BD ULTRA-FINE  MICRO PEN NEEDLE 32G X 6 MM MISC USE TO INJECT INSULIN FID 11/19/16  Yes [provider]  Blood Glucose Monitoring Suppl (ONETOUCH VERIO) W/DEVICE KIT 1 each by Does not apply route 4 (four) times daily. 07/25/14  Yes [name], MD  buPROPion (WELLBUTRIN) 75 MG tablet Take 1 tablet (75 mg total) by mouth 2 (two) times daily. 12/05/16  Yes [name], MD  chlorpheniramine (CHLOR-TRIMETON) 4 MG tablet Take 1 tablet (4 mg total) by mouth 3 (three) times daily as needed for allergies. 10/06/14  Yes [name], MD  clobetasol cream (TEMOVATE) 0.98 % Apply 1 application topically 2 (two) times daily. 01/07/17  Yes [provider]  DULoxetine (CYMBALTA) 60 MG capsule Take 1 capsule (60 mg total) by mouth 2 (two) times daily. 01/12/17  Yes [name], MD  ferrous gluconate (FERGON) 324 MG tablet Take 1 tablet (324 mg total) by mouth 2 (two) times daily with a meal. 12/05/16  Yes [name], MD  fluticasone (FLONASE) 50 MCG/ACT nasal spray Place 2 sprays into both nostrils daily. 12/05/16  Yes [name], MD  Fluticasone-Salmeterol (ADVAIR DISKUS) 500-50 MCG/DOSE AEPB Inhale 1 puff into the lungs 2 (two) times daily. 11/06/16  Yes [name], MD  furosemide (LASIX) 80 MG tablet Take 1 tab in AM and 1/2 tab at 3 pm 08/25/16  Yes [name], MD  gabapentin (NEURONTIN) 300 MG capsule Take 2 capsules (600 mg  total) by mouth 3 (three) times daily. 10/09/16  Yes [name], MD  glucose blood (ONETOUCH VERIO) test strip 1 each by Other route QID. Use as instructed 11/12/16  Yes [name], MD  HYDROcodone-acetaminophen (NORCO/VICODIN) 5-325 MG tablet Take 1-2 tablets by mouth every 6 (six) hours as needed for severe pain. 12/05/16  Yes [name], MD  insulin aspart (NOVOLOG) 100 UNIT/ML FlexPen Inject 18-28 Units into the skin 3 (three) times daily with meals. 10/16/16  Yes [name], MD  Insulin Detemir (LEVEMIR) 100 UNIT/ML Pen USE 37 UNITS TWICE  DAILY 08/06/16  Yes [name], MD  Insulin Syringe-Needle U-100 31G X 15/64" 0.5 ML MISC Use to inject insulin up to 4 times a day 04/17/16  Yes [name], MD  Lancets Misc. (ACCU-CHEK FASTCLIX LANCET) KIT Check your blood 4 times a day dx code 250.00 insulin requiring 01/19/13  Yes [name], MD  metoprolol succinate (TOPROL-XL) 25 MG 24 hr tablet Take 1 tablet (25 mg total) by mouth daily. 06/11/16  Yes [name], MD  nystatin cream (MYCOSTATIN) Apply 1 application topically 2 (two) times daily. 12/31/16  Yes [provider]  omeprazole (PRILOSEC) 40 MG capsule Take 1 capsule (40 mg total) by mouth 2 (two) times daily. 07/17/16  Yes [name], MD  OXYGEN Inhale 2 L into the lungs continuous.   Yes [provider]  potassium chloride SA (K-DUR,KLOR-CON) 20 MEQ tablet Take 1 tablet (20 mEq total) by mouth 2 (two) times daily. 08/25/16  Yes [name], MD  promethazine (PHENERGAN) 12.5 MG tablet Take 1 tablet (12.5 mg total) by mouth every 8 (eight) hours as needed for nausea or vomiting. 07/13/15  Yes [name], DO  rosuvastatin (CRESTOR) 20 MG tablet Take 1 tablet (20 mg total) by mouth at bedtime. 08/14/16  Yes [name], MD  tiotropium (SPIRIVA HANDIHALER) 18 MCG inhalation capsule Place 1 capsule (18 mcg total) into inhaler and inhale daily. 08/14/16  Yes [name], MD  triamcinolone cream (KENALOG) 0.1 % Apply 1 application topically 2 (two) times daily. 12/31/16  Yes [provider]  warfarin (COUMADIN) 2.5 MG tablet TAKE 1 TABLET BY MOUTH DAILY AT 6 PM EXCEPT ON MONDAYS 11/06/16  Yes [name], MD   Past Medical History:  Diagnosis Date  . Adenomatous polyps 05/14/2011   Colonoscopy (05/2011): 4 mm adenomatous polyp excised endoscopically Colonoscopy (02/2002): Adenomatous polyp excised endoscopically   . Allergic rhinitis 06/01/2012  . Anemia of chronic disease 01/01/2013  . Anxiety 07/24/2010  . Aortic atherosclerosis  ([place]) 10/19/2014   Seen on CT scan, currently asymptomatic  . 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 Asymptomatic cholelithiasis 09/25/2015   Seen on CT scan 08/2015  . Bilateral cataracts 08/04/2012   Visually insignificant   . Carotid artery stenosis    s/p right endarterectomy (06/2010) Carotid US (07/2010):  Left: Moderate-to-severe (60-79%) calcific and non-calcific plaque origin and proximal ICA and ECA   . Chronic congestive heart failure with left ventricular diastolic dysfunction ([place]) 10/21/2010  . Chronic constipation 02/03/2011  . Chronic daily headache 01/16/2014  . Chronic low back pain 10/06/2012  . Chronic venous insufficiency 08/04/2012  . Clear cell renal cell carcinoma ([place]) 07/21/2011   s/p cryoablation of left RCC in 09/2011 by [name]. Followed by [name]  [facility]) .    Marland Kitchen COPD (chronic obstructive pulmonary disease) with emphysema (HCC)    PFTs  02/2012: FEV1 0.92 (40%), ratio 69, 27% increase in FEV1 with BD, TLC 91%, severe airtrapping, DLCO49% On chronic home O2. Pulmonary rehab referral 05/2012   . Depression 11/19/2005  . Esophageal stenosis 08/08/2015   Mild, benign-appearing on EGD 06/29/2015  . Fibromyalgia 08/29/2010  . Gastroesophageal reflux disease   . History of blood transfusion    "several times"   . Hyperlipidemia LDL goal < 100 11/20/2005  . Internal hemorrhoids 08/04/2012  . Lichen sclerosus of female genitalia 01/12/2017  . Mitral stenosis    s/p Mitral valve replacement with a 27-mm pericardial porcine valve (Medtronic Mosaic valve, serial #[id number] on 09/20/10, Dr. Prescott Gum)   . Moderate to severe pulmonary hypertension ([place])    2014 TEE w PA peak pressure 46 mmHg, s/p MV replacement   . Moderately severe major depression ([place]) 11/19/2005  . Obesity (BMI 30.0-34.9) 10/23/2011  . Obstructive sleep apnea    Nocturnal polysomnography (06/2009):  Moderate sleep apnea/ hypopnea syndrome , AHI 17.8 per hour with nonpositional hypopneas. CPAP titration to 12 CWP, AHI 2.4 per hour. On nocturnal CPAP via a small resMed Quattro full-face mask with heated humidifier.   . Osteoporosis    DEXA (12/09/2011): L-spine T -3.7, left hip T -1.4 DEXA (12/2004): L-spine T -2.6, left hip -0.1   . Paroxysmal atrial fibrillation ([place]) 10/22/2010   s/p Left atrial maze procedure for paroxysmal atrial fibrillation on 09/20/2010 by Dr Prescott Gum.  Subsequent splenic infarct, decision was made to re-anticoagulate with coumadin, likely life-long as this is the most likely cause of the splenic infarct.   . Pulmonary hypertension due to chronic obstructive pulmonary disease ([place]) 04/25/2016  . Right nephrolithiasis 09/06/2014   5 mm non-obstructing calculus seen on CT scan 09/05/2014   . Seborrheic keratosis 09/28/2015  . Severe obesity (BMI 35.0-39.9) with comorbidity ([place]) 10/23/2011  . Shortness of breath dyspnea   . Tobacco abuse 07/28/2012  . Type 2 diabetes mellitus with diabetic neuropathy (HCC)    Social History   Socioeconomic History  . Marital status: Divorced    Spouse name: Not on file  . Number of children: Not on file  . Years of education: Not on file  . Highest education level: Not on file  Social Needs  . Financial resource strain: Not on file  . Food insecurity - worry: Not on file  . Food insecurity - inability: Not on file  . Transportation needs - medical: Not on file  . Transportation needs - non-medical: Not on file  Occupational History  . Not on file  Tobacco Use  . Smoking status: Current Every Day Smoker    Packs/day: 1.00    Years: 53.00    Pack years: 53.00    Types: Cigarettes    Start date: 11/01/1962  . Smokeless tobacco: Never Used  . Tobacco comment: 1/2 to 1 pack per day  Substance and Sexual Activity  . Alcohol use: No    Alcohol/week: 0.0 oz  . Drug use: No  . Sexual activity: No    Birth control/protection:  Post-menopausal  Other Topics Concern  . Not on file  Social History Narrative   Lives alone in [place] ([place] with 60 pound chow   Worked at [employer] for 18 years   No car   Family History  Problem Relation Age of Onset  . Peptic Ulcer Disease Father   . Heart attack Father 29       Died of MI at age 59  .  Heart attack Brother 31       Died of MI at age 23  . Obesity Brother   . Pneumonia Mother   . Healthy Sister   . Lupus Daughter   . Obsessive Compulsive Disorder Daughter     ASSESSMENT Recent Results: The most recent result is correlated with 15 mg per week: Lab Results  Component Value Date   INR 2.20 01/26/2017   INR 1.20 01/05/2017   INR 2.2 12/05/2016    Anticoagulation Dosing: Description   Take ONE (1)  tablet of your 2.57m green-colored warfarin tablet by mouth, once-daily at 6PM all days of week, EXCEPT on Mondays. Do NOT take any warfarin on Mondays of each week.      INR today: Therapeutic  PLAN Weekly dose was unchanged.  Patient Instructions  Patient instructed to take medications as defined in the Anti-coagulation Track section of this encounter.  Patient instructed to take today's dose.  Patient instructed to take ONE (1)  tablet of your 2.563mgreen-colored warfarin tablet by mouth, once-daily at 6PM all days of week, EXCEPT on Mondays. Do NOT take any warfarin on Mondays of each week. Patient verbalized understanding of these instructions.     Patient advised to contact clinic or seek medical attention if signs/symptoms of bleeding or thromboembolism occur.  Patient verbalized understanding by repeating back information and was advised to contact me if further medication-related questions arise. Patient was also provided an information handout.  Follow-up Return in 4 weeks (on 02/23/2017) for Follow up INR at 1130.  JaPennie BanterPharmD, CACP, CPP  15 minutes spent face-to-face with the patient during the encounter.  50% of time spent on education. 50% of time was spent on fingerstick point of care INR sample collection, processing, results determination, and documentation in [email or web address]

## 2017-01-27 ENCOUNTER — Other Ambulatory Visit: Payer: Self-pay | Admitting: Pharmacy Technician

## 2017-01-27 NOTE — Patient Outreach (Signed)
[facility]) Care Management  01/27/2017  TUYEN UNCAPHER 03/09/1950 [id number]  Incoming [employer] EMMI call in reference to medication adherence. HIPAA identifiers verified and verbal consent received. Patient states she takes her medications daily as prescribed and does not have any barriers that would affect her adherence.  Doreene Burke, [place] [phone]

## 2017-01-29 ENCOUNTER — Other Ambulatory Visit: Payer: Self-pay | Admitting: Internal Medicine

## 2017-01-29 DIAGNOSIS — I480 Paroxysmal atrial fibrillation: Secondary | ICD-10-CM

## 2017-01-29 DIAGNOSIS — B373 Candidiasis of vulva and vagina: Secondary | ICD-10-CM

## 2017-01-29 DIAGNOSIS — B3731 Acute candidiasis of vulva and vagina: Secondary | ICD-10-CM

## 2017-01-30 NOTE — Telephone Encounter (Signed)
Next appt scheduled  03/06/16 with PCP.

## 2017-01-30 NOTE — Progress Notes (Signed)
Indication: Paroxysmal Atrial fibrillation. Duration: Indefinite. INR: At target. Agree with Dr. Gladstone Pih assessment and plan.  [name] states she developed redness of the vulvar area after biopsy and was prescribed Bactrim DS.  She states she picked up the medication, but has not taken it because of concerns regarding potential interactions with warfarin.  On examination, she did not have vulvar or labial erythema consistent with a cellulitis.  She was informed she did not need to take the Bactrim DS.

## 2017-02-05 ENCOUNTER — Telehealth: Payer: Self-pay

## 2017-02-05 NOTE — Telephone Encounter (Signed)
Would like [name] to call back.  

## 2017-02-05 NOTE — Telephone Encounter (Signed)
Called and cancelled appt at pt's request

## 2017-02-06 ENCOUNTER — Telehealth: Payer: Self-pay | Admitting: Dietician

## 2017-02-06 NOTE — Telephone Encounter (Signed)
"  I did something stupid", [name] accidentally took 6 units levemir instead of her novolog this am. Her blood sugar was in mid 200s this am. We discussed being vigilant about checking her blood sugar for the next 24 hours and especially this after noon and evening and treating low blood sugar if needed. She can reduce her PM injection of levemir if needed tonight to prevent nocturnal hypoglycemia. She verbalized understanding.

## 2017-02-15 ENCOUNTER — Ambulatory Visit (HOSPITAL_COMMUNITY)
Admission: EM | Admit: 2017-02-15 | Discharge: 2017-02-15 | Disposition: A | Discharge status: Home / Self Care | Payer: PPO | Attending: Internal Medicine | Admitting: Internal Medicine

## 2017-02-15 ENCOUNTER — Encounter (HOSPITAL_COMMUNITY): Payer: Self-pay | Admitting: *Deleted

## 2017-02-15 ENCOUNTER — Other Ambulatory Visit: Payer: Self-pay

## 2017-02-15 DIAGNOSIS — J069 Acute upper respiratory infection, unspecified: Secondary | ICD-10-CM

## 2017-02-15 NOTE — ED Triage Notes (Addendum)
Per pt she is here due to running nose, per pt she is having yellow mucus coming out of her sinus, per pt she have a headache, per pt this started 3 days ago,

## 2017-02-15 NOTE — ED Provider Notes (Signed)
[facility]    CSN: [id number] Arrival date & time: 02/15/17  1257     History   Chief Complaint Chief Complaint  Patient presents with  . Facial Pain    HPI Emmalin A Wickliff is a 67 y.o. female.   Deboraha presents with complaints of runny nose and facial pressure which has been ongoing for the past 3 days. She has been taking an allergy medication, daily flonase and inhalers, as well as saline rinses which have minimally helped. Mild headache. Mouth feels sore. Rates pain 8/10. She does smoke. Mild sore throat and cough. Without fevers. No known ill contacts. Uses home O2 but did not wear it today. Significant medical history including paroxysmal afib, mitral valve replacement, diabetes, chf. She is on coumadin. Her symptoms have not been worsening but have not improved.    ROS per HPI.       Past Medical History:  Diagnosis Date  . Adenomatous polyps 05/14/2011   Colonoscopy (05/2011): 4 mm adenomatous polyp excised endoscopically Colonoscopy (02/2002): Adenomatous polyp excised endoscopically   . Allergic rhinitis 06/01/2012  . Anemia of chronic disease 01/01/2013  . Anxiety 07/24/2010  . Aortic atherosclerosis (HCC) 10/19/2014   Seen on CT scan, currently asymptomatic  . 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 Asymptomatic cholelithiasis 09/25/2015   Seen on CT scan 08/2015  . Bilateral cataracts 08/04/2012   Visually insignificant   . Carotid artery stenosis    s/p right endarterectomy (06/2010) Carotid US (07/2010):  Left: Moderate-to-severe (60-79%) calcific and non-calcific plaque origin and proximal ICA and ECA   . Chronic congestive heart failure with left ventricular diastolic dysfunction (HCC) 10/21/2010  . Chronic constipation 02/03/2011  . Chronic daily headache 01/16/2014  . Chronic low back pain 10/06/2012  . Chronic venous insufficiency 08/04/2012  . Clear cell renal  cell carcinoma (HCC) 07/21/2011   s/p cryoablation of left RCC in 09/2011 by Dr. Yamagata. Followed by Dr. Dahlstedt  ([facility]) .    . COPD (chronic obstructive pulmonary disease) with emphysema (HCC)    PFTs 02/2012: FEV1 0.92 (40%), ratio 69, 27% increase in FEV1 with BD, TLC 91%, severe airtrapping, DLCO49% On chronic home O2. Pulmonary rehab referral 05/2012   . Depression 11/19/2005  . Esophageal stenosis 08/08/2015   Mild, benign-appearing on EGD 06/29/2015  . Fibromyalgia 08/29/2010  . Gastroesophageal reflux disease   . History of blood transfusion    "several times"   . Hyperlipidemia LDL goal < 100 11/20/2005  . Internal hemorrhoids 08/04/2012  . Lichen sclerosus of female genitalia 01/12/2017  . Mitral stenosis    s/p Mitral valve replacement with a 27-mm pericardial porcine valve (Medtronic Mosaic valve, serial #[id number] on 09/20/10, Dr. Van Trigt)   . Moderate to severe pulmonary hypertension (HCC)    2014 TEE w PA peak pressure 46 mmHg, s/p MV replacement   . Moderately severe major depression (HCC) 11/19/2005  . Obesity (BMI 30.0-34.9) 10/23/2011  . Obstructive sleep apnea    Nocturnal polysomnography (06/2009): Moderate sleep apnea/ hypopnea syndrome , AHI 17.8 per hour with nonpositional hypopneas. CPAP titration to 12 CWP, AHI 2.4 per hour. On nocturnal CPAP via a small resMed Quattro full-face mask with heated humidifier.   . Osteoporosis    DEXA (12/09/2011): L-spine T -3.7, left hip T -1.4 DEXA (12/2004): L-spine T -2.6, left hip -0.1   . Paroxysmal atrial fibrillation (HCC) 10/22/2010   s/p Left atrial maze   procedure for paroxysmal atrial fibrillation on 09/20/2010 by Dr Prescott Gum.  Subsequent splenic infarct, decision was made to re-anticoagulate with coumadin, likely life-long as this is the most likely cause of the splenic infarct.   . Pulmonary hypertension due to chronic obstructive pulmonary disease ([place]) 04/25/2016  . Right nephrolithiasis 09/06/2014   5 mm  non-obstructing calculus seen on CT scan 09/05/2014   . Seborrheic keratosis 09/28/2015  . Severe obesity (BMI 35.0-39.9) with comorbidity ([place]) 10/23/2011  . Shortness of breath dyspnea   . Tobacco abuse 07/28/2012  . Type 2 diabetes mellitus with diabetic neuropathy [facility])     Patient Active Problem List   Diagnosis Date Noted  . Lichen sclerosus of female genitalia 01/12/2017  . Swallowing difficulty 01/12/2017  . Mixed stress and urge urinary incontinence 05/27/2016  . Pulmonary hypertension due to chronic obstructive pulmonary disease ([place]) 04/25/2016  . Right ventricular failure ([place]) 04/25/2016  . Seborrheic keratosis 09/28/2015  . Asymptomatic cholelithiasis 09/25/2015  . Esophageal stenosis 08/08/2015  . Arteriovenous malformation of gastrointestinal tract 08/08/2015  . Aortic atherosclerosis ([place]) 10/19/2014  . Right nephrolithiasis 09/06/2014  . Vulvovaginal candidiasis 09/20/2013  . Chronic daily headache 01/14/2013  . Anemia of chronic disease 01/01/2013  . Chronic low back pain 10/06/2012  . Long term current use of anticoagulant therapy 09/30/2012  . Bilateral cataracts 08/04/2012  . Internal and external hemorrhoids without complication 01/75/1025  . Chronic venous insufficiency 08/04/2012  . Tobacco abuse 07/28/2012  . Allergic rhinitis 06/01/2012  . Severe obesity (BMI 35.0-39.9) with comorbidity ([place]) 10/23/2011  . Clear cell renal cell carcinoma ([place]) 07/21/2011  . Adenomatous polyps 05/14/2011  . Health care maintenance 11/04/2010  . Paroxysmal atrial fibrillation ([place]) 10/22/2010  . Chronic congestive heart failure with left ventricular diastolic dysfunction ([place]) 10/21/2010  . Fibromyalgia 08/29/2010  . Gastroesophageal reflux disease   . Type 2 diabetes mellitus with diabetic neuropathy ([place])   . Pulmonary emphysema ([place])   . Pulmonary hypertension due to left ventricular diastolic dysfunction ([place])   . Osteoporosis   . Obstructive sleep apnea   . Mitral  stenosis   . Stenosis of right carotid artery   . Anxiety 07/24/2010  . Hyperlipidemia 11/20/2005  . Moderately severe major depression ([place]) 11/19/2005    Past Surgical History:  Procedure Laterality Date  . CARDIAC VALVE REPLACEMENT  Aug. 2012   "mitral valve"  . CAROTID ENDARTERECTOMY Right 07/04/2010   by [name] for asymptomatic right carotid artery stenosis  . CHEST TUBE INSERTION  09/24/2010   Dr Prescott Gum  . COLONOSCOPY  05/12/2011   performed by Dr. Michail Sermon. Showing small internal hemorrhoids, single tubular adenoma polyp  . CRYOABLATION Left 09/2011   by [name]. Followed by [name]  [facility]) .    Marland Kitchen DILATION AND CURETTAGE OF UTERUS    . ESOPHAGOGASTRODUODENOSCOPY  05/12/2011   performed by Dr. Michail Sermon. Negative for ulcerations, biopsy negative for evidence of celiac sprue  . ESOPHAGOGASTRODUODENOSCOPY N/A 06/29/2015   Procedure: ESOPHAGOGASTRODUODENOSCOPY (EGD);  Surgeon: Clarene Essex, MD;  Location: [facility] ENDOSCOPY;  Service: Endoscopy;  Laterality: N/A;  . ESOPHAGOGASTRODUODENOSCOPY N/A 03/29/2016   Procedure: ESOPHAGOGASTRODUODENOSCOPY (EGD);  Surgeon: Clarene Essex, MD;  Location: [facility] ENDOSCOPY;  Service: Endoscopy;  Laterality: N/A;  . FRACTURE SURGERY Right 2005   clavicle  . GIVENS CAPSULE STUDY N/A 06/30/2015   Procedure: GIVENS CAPSULE STUDY;  Surgeon: Clarene Essex, MD;  Location: [facility] ENDOSCOPY;  Service: Endoscopy;  Laterality: N/A;  . GIVENS CAPSULE STUDY N/A 06/29/2015  Procedure: GIVENS CAPSULE STUDY;  Surgeon: Marc Magod, MD;  Location: [facility] ENDOSCOPY;  Service: Endoscopy;  Laterality: N/A;  . Hysteroscopy with endometrial ablation  06/2001   for persistent post-menopausal bleeding // by S. Dean McPhail, M.D.  . IR GENERIC HISTORICAL  08/23/2015   IR RADIOLOGIST EVAL & MGMT 08/23/2015 Glenn Yamagata, MD [facility] INTERV RAD  . IR GENERIC HISTORICAL  04/09/2016   IR RADIOLOGIST EVAL & MGMT 04/09/2016 Glenn Yamagata, MD [facility] INTERV RAD  . IR RADIOLOGIST EVAL  & MGMT  10/07/2016  . LEFT HEART CATH AND CORONARY ANGIOGRAPHY N/A 04/21/2016   Procedure: Left Heart Cath and Coronary Angiography;  Surgeon: Jonathan J Berry, MD;  Location: [facility] LAB;  Service: Cardiovascular;  Laterality: N/A;  . LIPOMA EXCISION  08/2005   occipital lipoma 1.5cm - by Dr. Leone  . MAZE Left 09/20/10   for paroxysmal atrial fibrillation (Dr. Van Trigt)  . MITRAL VALVE REPLACEMENT  09/20/10    with a 27-mm pericardial porcine valve (Medtronic Mosaic valve, serial #[id number]). 09/20/10, Dr Van Trigt  . ORIF CLAVICLE FRACTURE  01/2004   by Mark C. Yates, M.D for Right clavicle nonunion.  . RIGHT HEART CATH N/A 04/23/2016   Procedure: Right Heart Cath;  Surgeon: Dalton S McLean, MD;  [facility];  Service: Cardiovascular;  Laterality: N/A;  . TONSILLECTOMY    . TUBAL LIGATION      OB History    No data available       Home Medications    Prior to Admission medications   Medication Sig Start Date End Date Taking? Authorizing Provider  albuterol (PROVENTIL HFA;VENTOLIN HFA) 108 (90 Base) MCG/ACT inhaler Inhale 2 puffs into the lungs every 6 (six) hours as needed for wheezing or shortness of breath. 12/03/16   Klima, Lawrence, MD  ALPRAZolam (XANAX) 1 MG tablet TAKE 1/2 TO 1 TABLET BY MOUTH TWICE DAILY AS NEEDED FOR ANXIETY 01/05/17   Klima, Lawrence, MD  BD ULTRA-FINE MICRO PEN NEEDLE 32G X 6 MM MISC USE TO INJECT INSULIN FID 11/19/16   [provider]  Blood Glucose Monitoring Suppl (ONETOUCH VERIO) W/DEVICE KIT 1 each by Does not apply route 4 (four) times daily. 07/25/14   Klima, Lawrence, MD  buPROPion (WELLBUTRIN) 75 MG tablet Take 1 tablet (75 mg total) by mouth 2 (two) times daily. 12/05/16   Klima, Lawrence, MD  chlorpheniramine (CHLOR-TRIMETON) 4 MG tablet Take 1 tablet (4 mg total) by mouth 3 (three) times daily as needed for allergies. 10/06/14   Klima, Lawrence, MD  clobetasol cream (TEMOVATE) 0.05 % Apply 1 application topically 2  (two) times daily. 01/07/17   [provider]  DULoxetine (CYMBALTA) 60 MG capsule Take 1 capsule (60 mg total) by mouth 2 (two) times daily. 01/12/17   Klima, Lawrence, MD  ferrous gluconate (FERGON) 324 MG tablet Take 1 tablet (324 mg total) by mouth 2 (two) times daily with a meal. 12/05/16   Klima, Lawrence, MD  fluticasone (FLONASE) 50 MCG/ACT nasal spray Place 2 sprays into both nostrils daily. 12/05/16   Klima, Lawrence, MD  Fluticasone-Salmeterol (ADVAIR DISKUS) 500-50 MCG/DOSE AEPB Inhale 1 puff into the lungs 2 (two) times daily. 11/06/16   Klima, Lawrence, MD  furosemide (LASIX) 80 MG tablet Take 1 tab in AM and 1/2 tab at 3 pm 08/25/16   McLean, Dalton S, MD  gabapentin (NEURONTIN) 300 MG capsule Take 2 capsules (600 mg total) by mouth 3 (three) times daily. 10/09/16   Klima,   Lawrence, MD  glucose blood (ONETOUCH VERIO) test strip 1 each by Other route QID. Use as instructed 11/12/16   [name], MD  HYDROcodone-acetaminophen (NORCO/VICODIN) 5-325 MG tablet Take 1-2 tablets by mouth every 6 (six) hours as needed for severe pain. 12/05/16   [name], MD  insulin aspart (NOVOLOG) 100 UNIT/ML FlexPen Inject 18-28 Units into the skin 3 (three) times daily with meals. 10/16/16   [name], MD  Insulin Detemir (LEVEMIR) 100 UNIT/ML Pen USE 37 UNITS TWICE DAILY 08/06/16   [name], MD  Insulin Syringe-Needle U-100 31G X 15/64" 0.5 ML MISC Use to inject insulin up to 4 times a day 04/17/16   [name], MD  Lancets Misc. (ACCU-CHEK FASTCLIX LANCET) KIT Check your blood 4 times a day dx code 250.00 insulin requiring 01/19/13   [name], MD  metoprolol succinate (TOPROL-XL) 25 MG 24 hr tablet Take 1 tablet (25 mg total) by mouth daily. 06/11/16   [name], MD  nystatin cream (MYCOSTATIN) Apply 1 application topically 2 (two) times daily. 12/31/16   [provider]  omeprazole (PRILOSEC) 40 MG capsule Take 1 capsule (40 mg total) by mouth 2 (two)  times daily. 07/17/16   [name], MD  OXYGEN Inhale 2 L into the lungs continuous.    [provider]  potassium chloride SA (K-DUR,KLOR-CON) 20 MEQ tablet Take 1 tablet (20 mEq total) by mouth 2 (two) times daily. 08/25/16   [name], MD  promethazine (PHENERGAN) 12.5 MG tablet Take 1 tablet (12.5 mg total) by mouth every 8 (eight) hours as needed for nausea or vomiting. 07/13/15   [name], DO  rosuvastatin (CRESTOR) 20 MG tablet Take 1 tablet (20 mg total) by mouth at bedtime. 08/14/16   [name], MD  tiotropium (SPIRIVA HANDIHALER) 18 MCG inhalation capsule Place 1 capsule (18 mcg total) into inhaler and inhale daily. 08/14/16   [name], MD  triamcinolone cream (KENALOG) 0.1 % Apply 1 application topically 2 (two) times daily. 12/31/16   [provider]  warfarin (COUMADIN) 2.5 MG tablet TAKE 1 TABLET BY MOUTH DAILY AT 6 PM EXCEPT ON MONDAYS 01/30/17   [name], MD    Family History Family History  Problem Relation Age of Onset  . Peptic Ulcer Disease Father   . Heart attack Father 76       Died of MI at age 65  . Heart attack Brother 81       Died of MI at age 42  . Obesity Brother   . Pneumonia Mother   . Healthy Sister   . Lupus Daughter   . Obsessive Compulsive Disorder Daughter     Social History Social History   Tobacco Use  . Smoking status: Current Every Day Smoker    Packs/day: 1.00    Years: 53.00    Pack years: 53.00    Types: Cigarettes    Start date: 11/01/1962  . Smokeless tobacco: Never Used  . Tobacco comment: 1/2 to 1 pack per day  Substance Use Topics  . Alcohol use: No    Alcohol/week: 0.0 oz  . Drug use: No     Allergies   Lorazepam; Morphine and related; Oxycontin [oxycodone]; and Tramadol hcl   Review of Systems Review of Systems   Physical Exam Triage Vital Signs ED Triage Vitals  Enc Vitals Group     BP 02/15/17 1432 (!) 136/28     Pulse Rate 02/15/17 1432 86  Resp --      Temp  02/15/17 1432 98.3 F (36.8 C)     Temp Source 02/15/17 1432 Oral     SpO2 02/15/17 1432 95 %     Weight --      Height --      Head Circumference --      Peak Flow --      Pain Score 02/15/17 1430 8     Pain Loc --      Pain Edu? --      Excl. in [place]? --    No data found.  Updated Vital Signs BP (!) 136/28 (BP Location: Left Arm) Comment: per pt her BP is normally low on both top and bottom number   Pulse 86   Temp 98.3 F (36.8 C) (Oral)   SpO2 95%   Visual Acuity Right Eye Distance:   Left Eye Distance:   Bilateral Distance:    Right Eye Near:   Left Eye Near:    Bilateral Near:     Physical Exam  Constitutional: She is oriented to person, place, and time. She appears well-developed and well-nourished. No distress.  HENT:  Head: Normocephalic and atraumatic.  Right Ear: Tympanic membrane, external ear and ear canal normal.  Left Ear: Tympanic membrane, external ear and ear canal normal.  Nose: Mucosal edema present. Left sinus exhibits maxillary sinus tenderness.  Mouth/Throat: Uvula is midline, oropharynx is clear and moist and mucous membranes are normal. No tonsillar exudate.  Eyes: Conjunctivae and EOM are normal. Pupils are equal, round, and reactive to light.  Cardiovascular: Normal rate, regular rhythm and normal heart sounds.  Pulmonary/Chest: Effort normal and breath sounds normal. No respiratory distress.  Neurological: She is alert and oriented to person, place, and time.  Skin: Skin is warm and dry.     UC Treatments / Results  Labs (all labs ordered are listed, but only abnormal results are displayed) Labs Reviewed - No data to display  EKG  EKG Interpretation None       Radiology No results found.  Procedures Procedures (including critical care time)  Medications Ordered in UC Medications - No data to display   Initial Impression / Assessment and Plan / UC Course  I have reviewed the triage vital signs and the nursing  notes.  Pertinent labs & imaging results that were available during my care of the patient were reviewed by me and considered in my medical decision making (see chart for details).     Vitals stable at this time, benign physical findings. Symptoms for three days at this point. Continue with supportive cares. Recommended close follow up with PCP as if needs antibiotics may need closer monitoring of inr. Return precautions provided. Patient verbalized understanding and agreeable to plan.  Ambulatory out of clinic without difficulty.    Final Clinical Impressions(s) / UC Diagnoses   Final diagnoses:  Viral URI    ED Discharge Orders    None       Controlled Substance Prescriptions [place] Controlled Substance Registry consulted? Not Applicable   Zigmund Gottron, NP 02/15/17 [phone]

## 2017-02-15 NOTE — Discharge Instructions (Signed)
Continue to drink plenty of fluids to keep secretions thin and draining. Continue with saline rinses throughout the day to promote drainage. Daily flonase. Tylenol as needed for pain. Please follow up with PCP in the next week if your symptoms persist. Return sooner if worsening, developing fevers, weakness or otherwise worsening.

## 2017-02-16 IMAGING — CR DG RIBS W/ CHEST 3+V*R*
3 series · 3 of 3 positions shown · non-contrast
Comparison: 05/10/2014

CLINICAL DATA: Fall 3 weeks ago. Worsening right rib pain and
shortness of breath. Initial encounter.

EXAM:
RIGHT RIBS AND CHEST - 3+ VIEW

[chest ap]
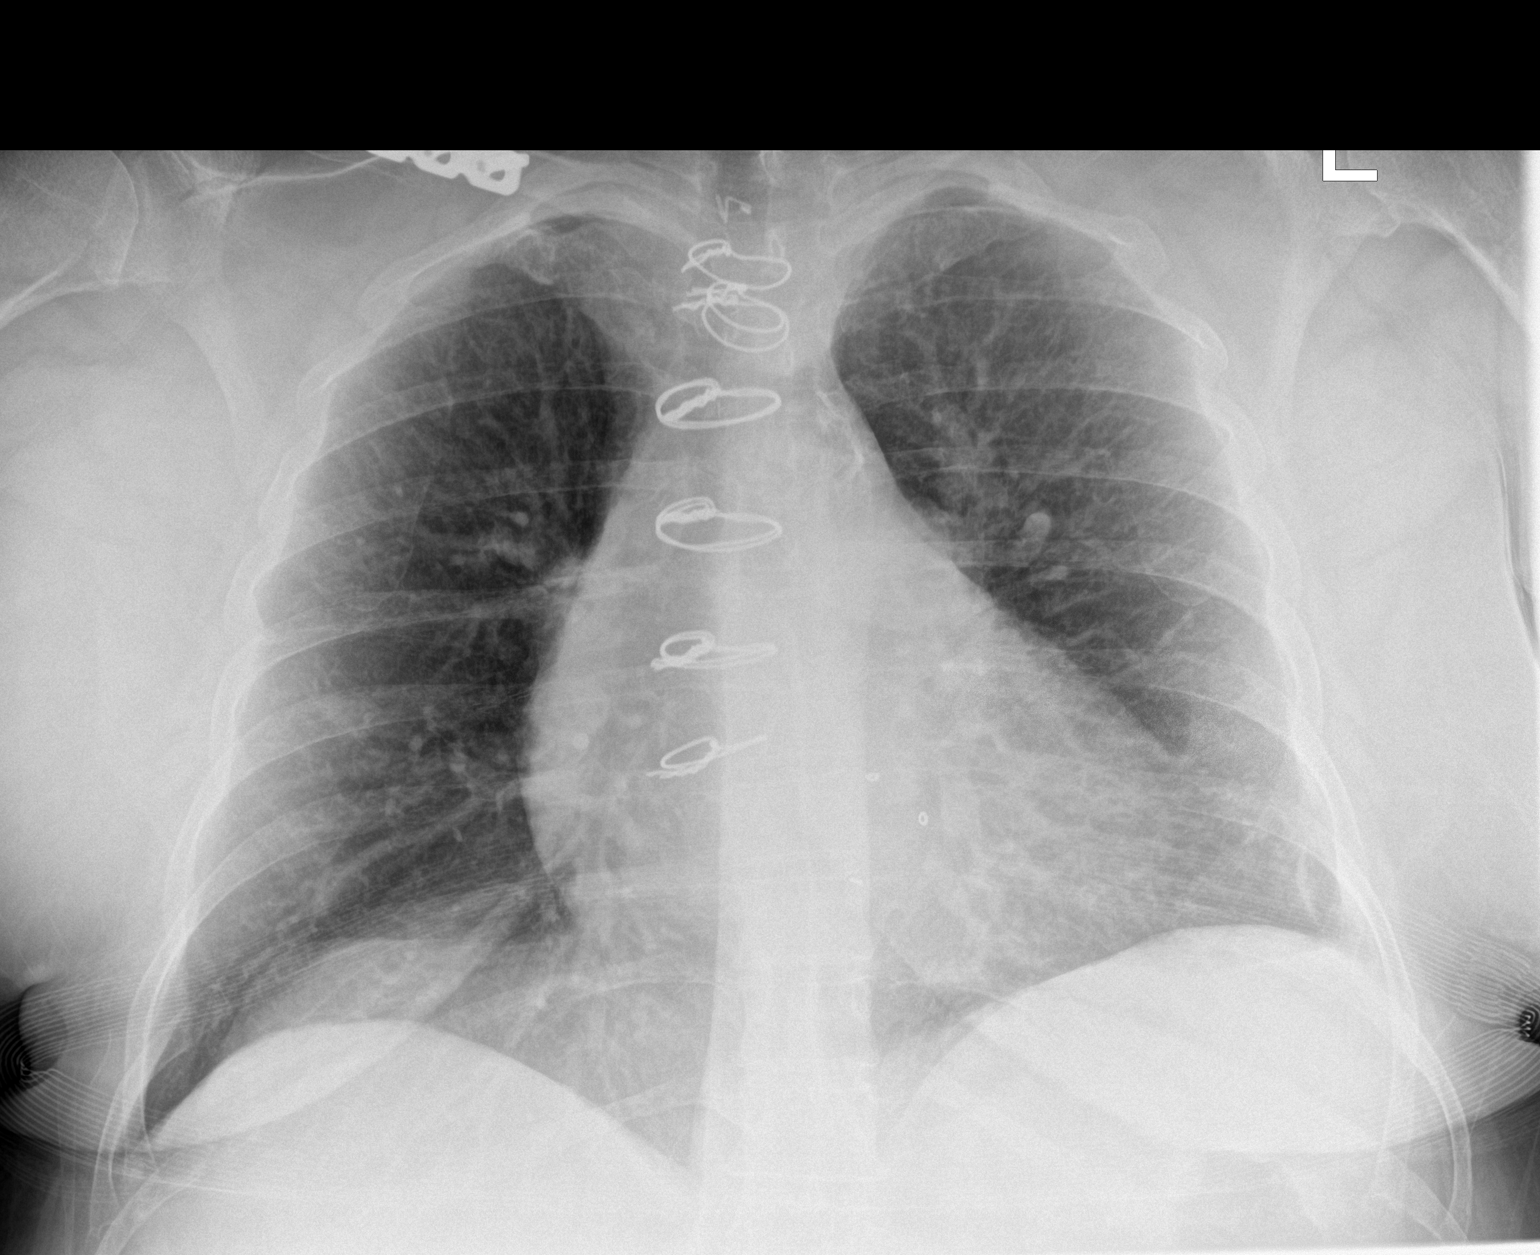

[rib ap]
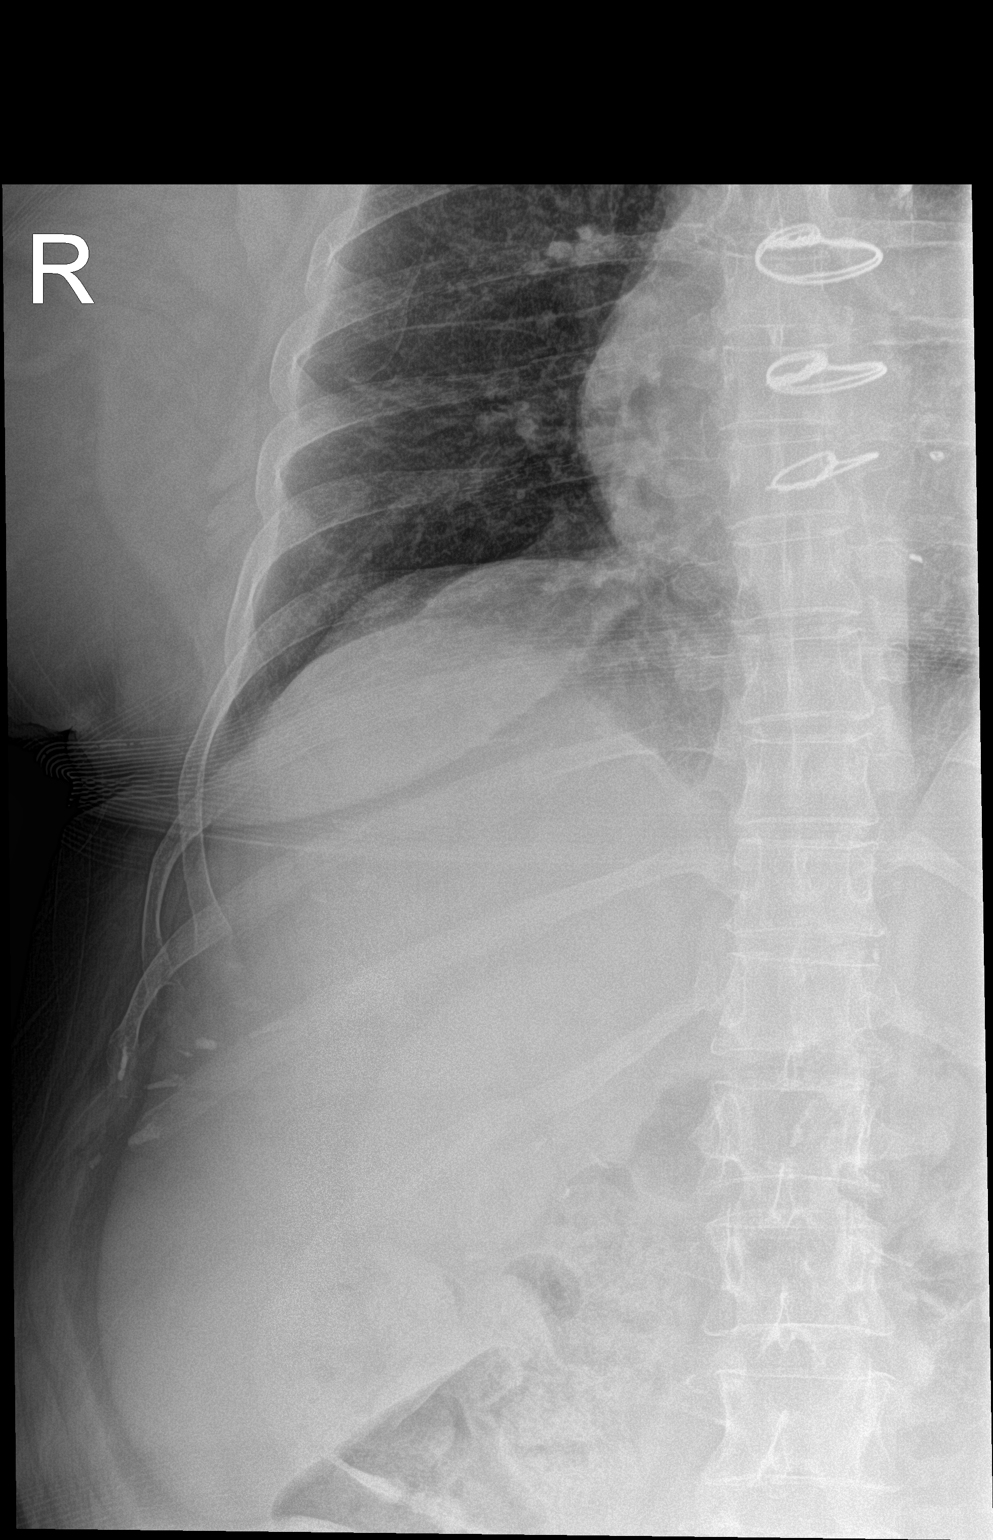

[rib ap obl]
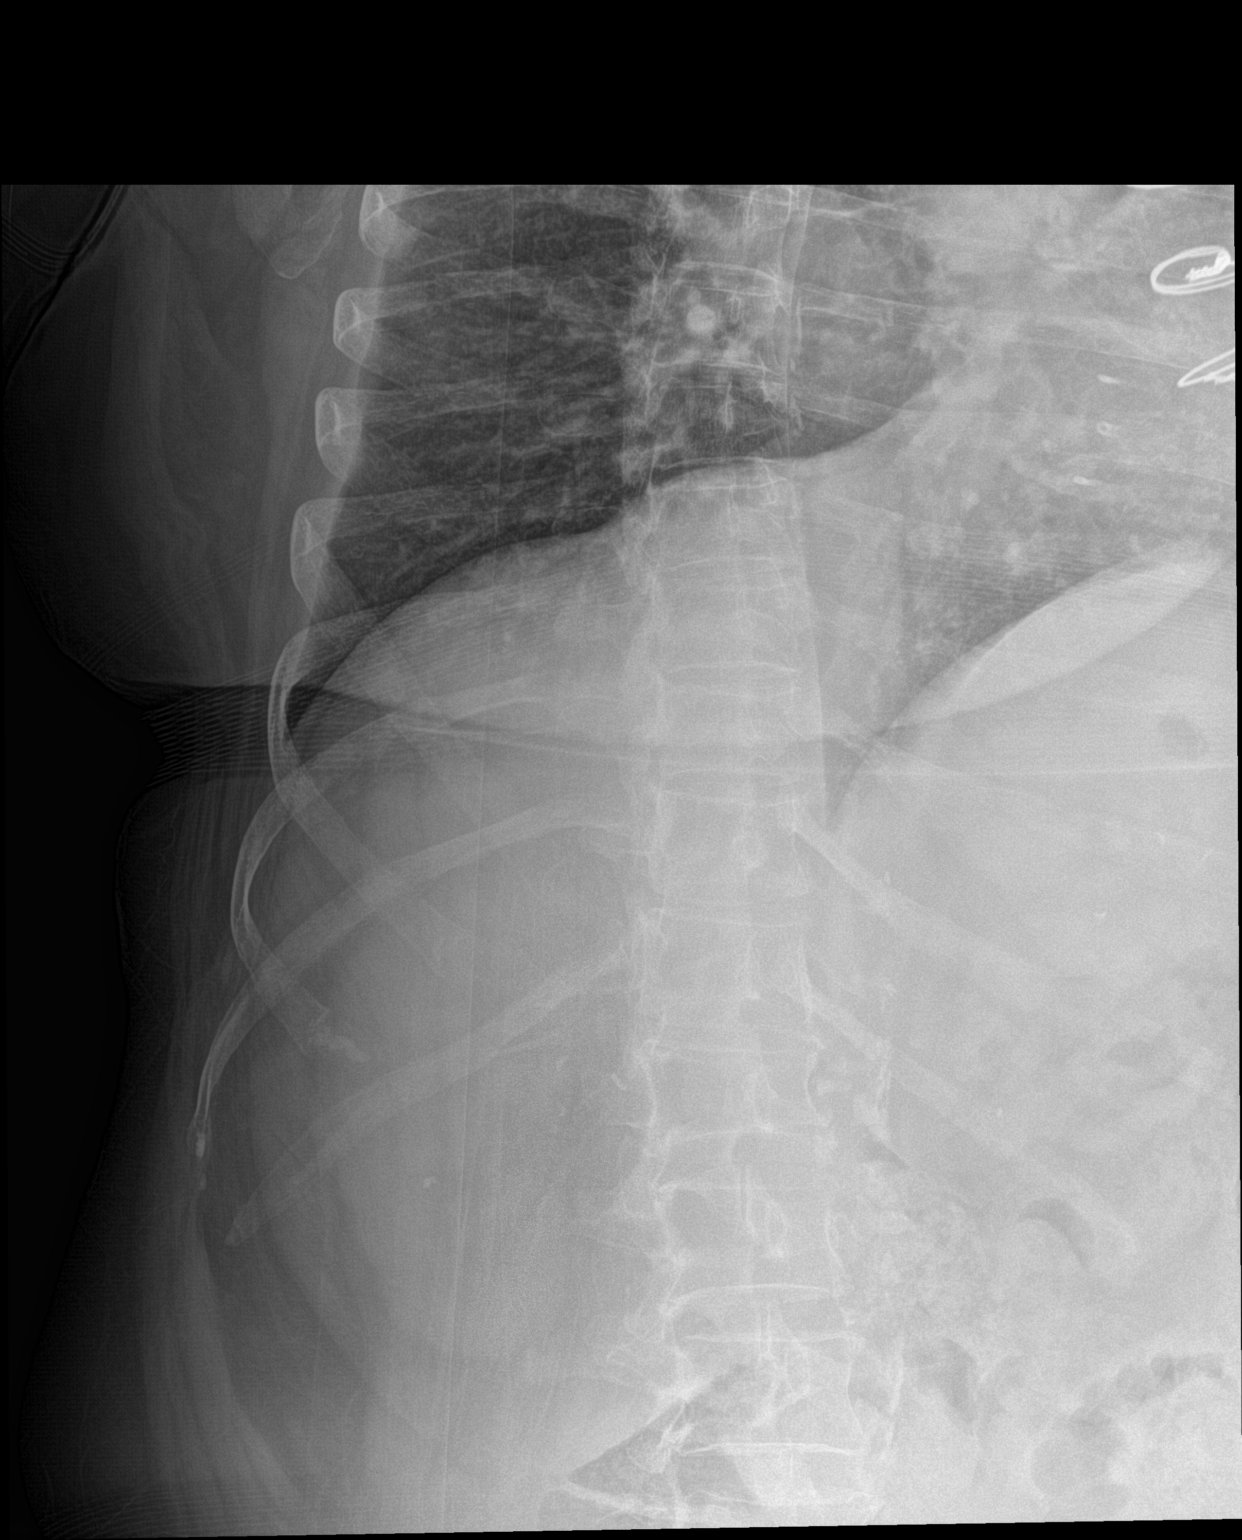

[3 of 3 positions shown; findings below may reference images not displayed]

FINDINGS: No fracture or other bone lesions are seen involving the ribs. There
is no evidence of pneumothorax or pleural effusion.

Both lungs are clear. Heart size and mediastinal contours are within
normal limits. Patient has undergone previous median sternotomy.
Fixation plate and screws again seen in the right clavicle.
IMPRESSION: No acute right rib fractures identified. No active cardiopulmonary
disease.

## 2017-02-17 ENCOUNTER — Encounter (HOSPITAL_COMMUNITY): Payer: PPO | Admitting: Cardiology

## 2017-02-18 ENCOUNTER — Telehealth: Payer: Self-pay

## 2017-02-18 IMAGING — CR DG RIBS W/ CHEST 3+V*R*
5 series · 5 of 5 positions shown · non-contrast
Comparison: Chest and right rib radiographs performed 10/11/2014

CLINICAL DATA: Status post fall, with right flank pain. Initial
encounter.

EXAM:
RIGHT RIBS AND CHEST - 3+ VIEW

[t chest supine]
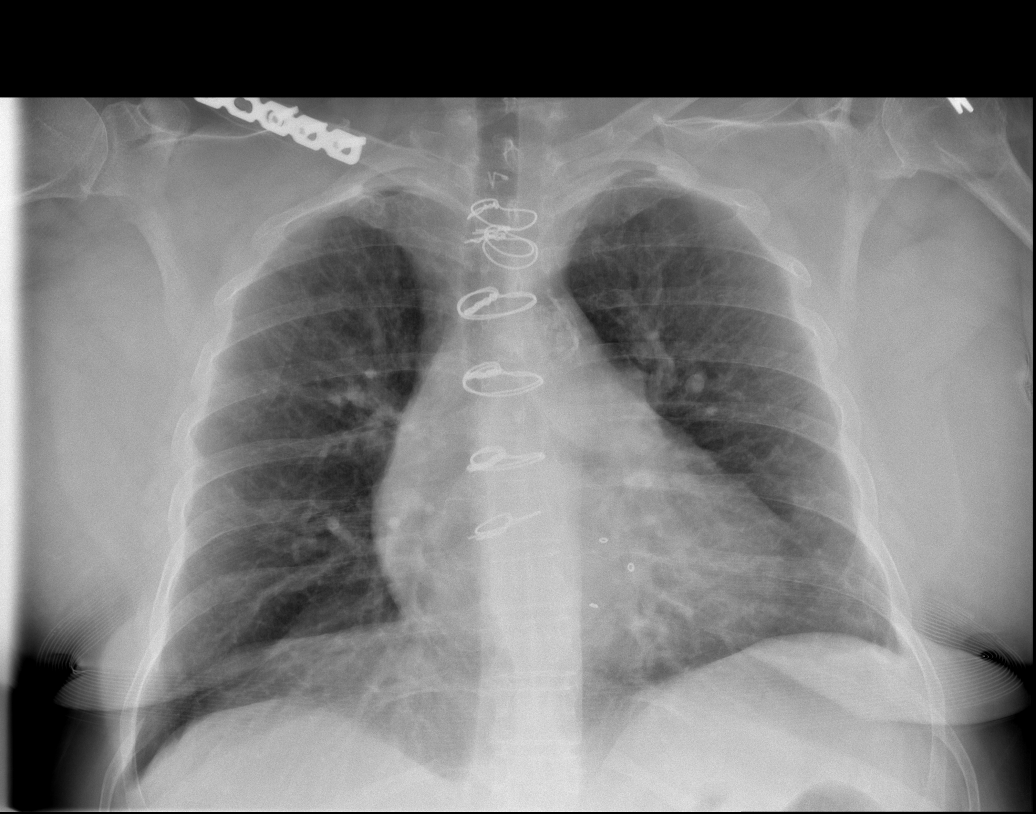

[t ribs ap upper right]
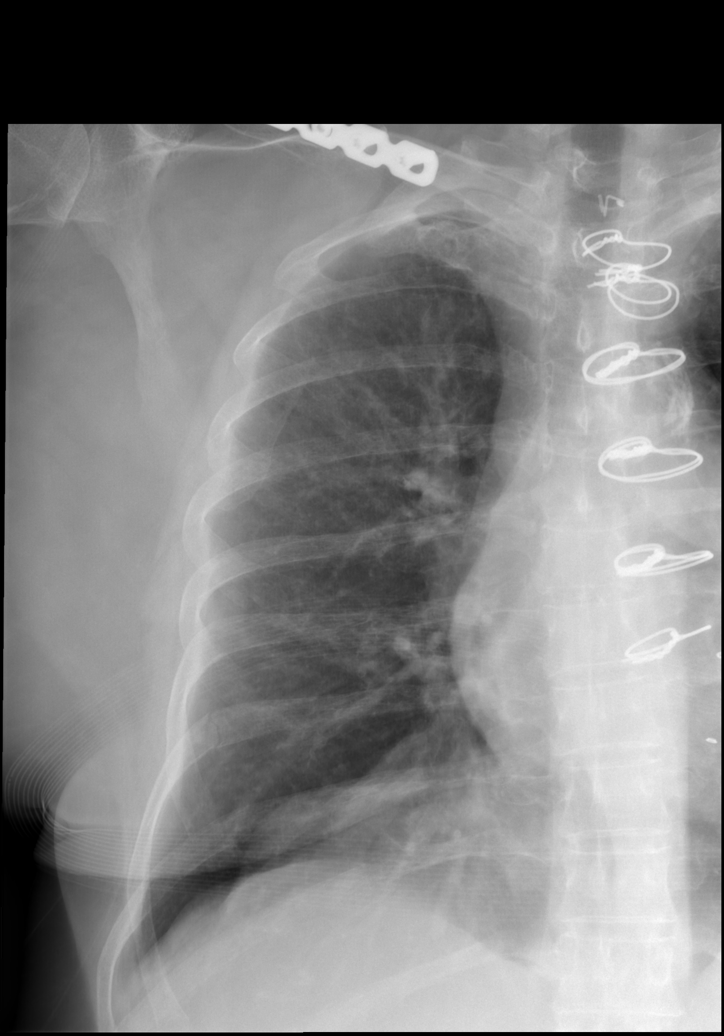

[t ribs ap lower right]
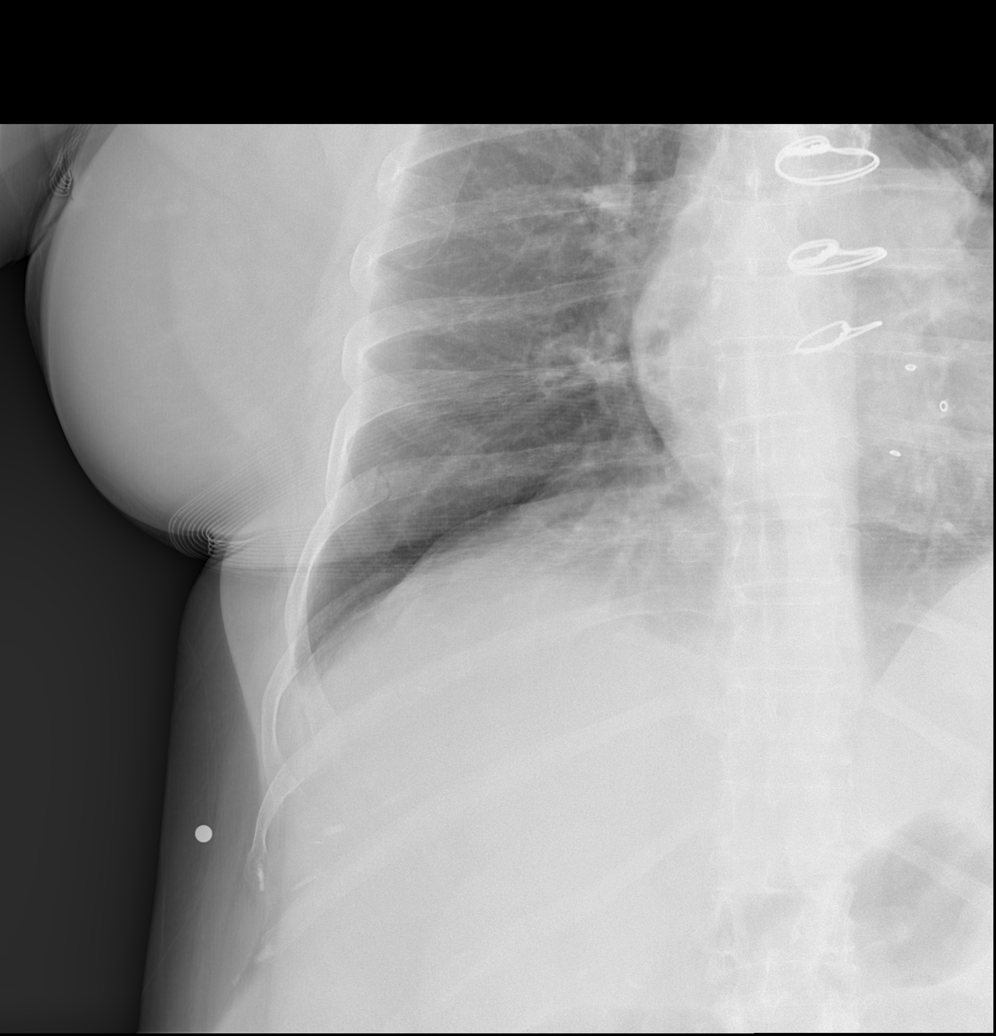

[t ribs rpo right (1 of 2)]
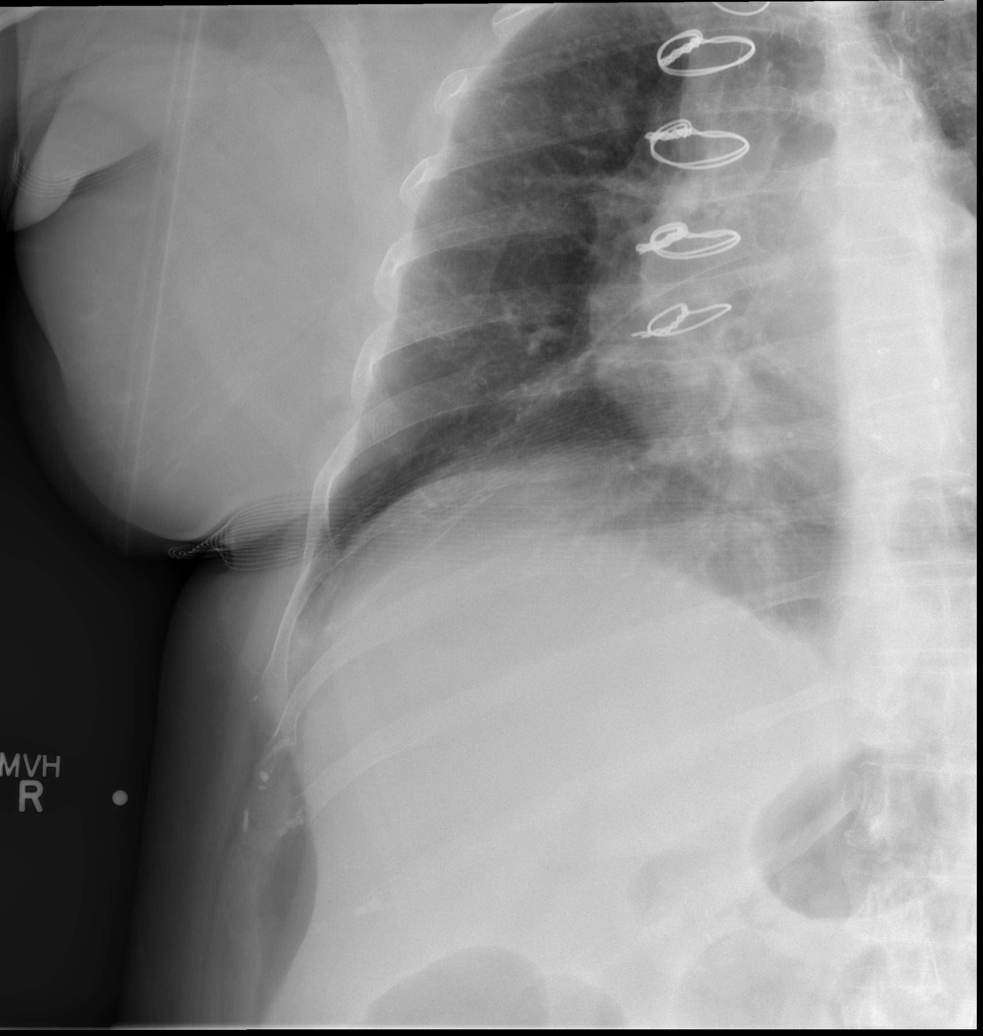

[t ribs rpo right (2 of 2)]
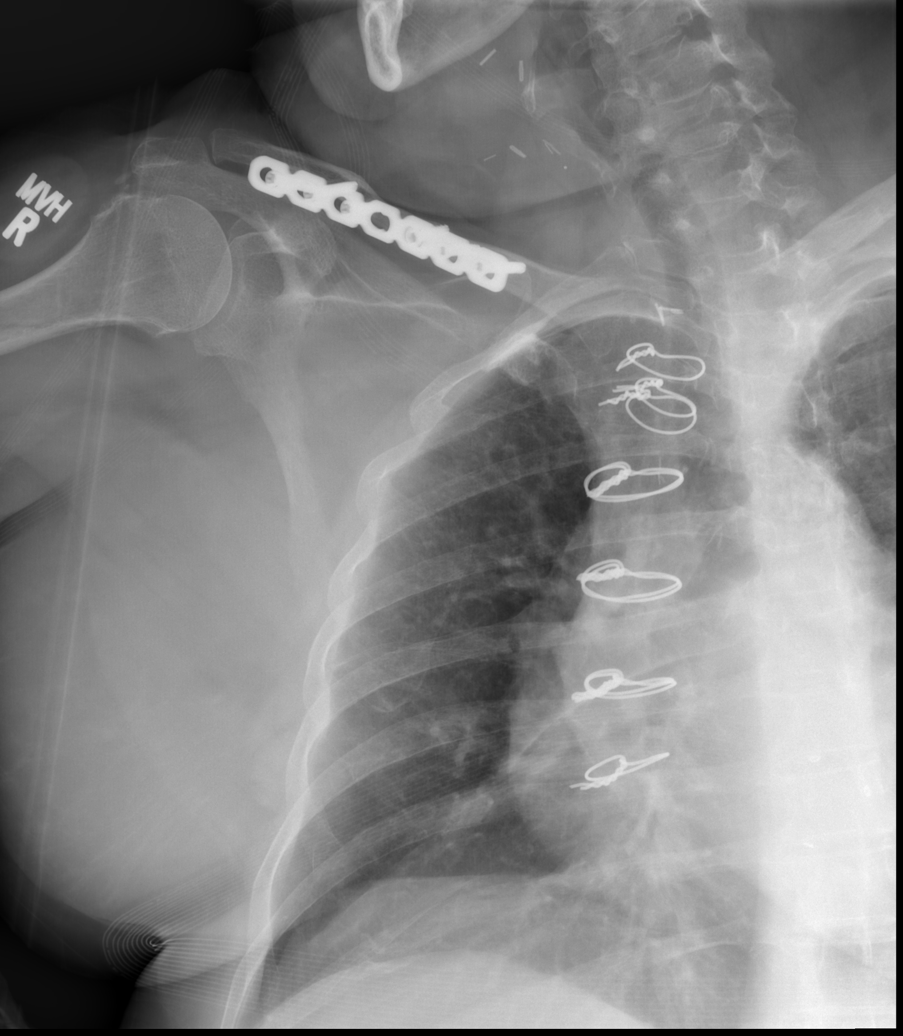

[5 of 5 positions shown; findings below may reference images not displayed]

FINDINGS: There is a minimally displaced fracture of the right eighth
posterolateral rib.

The lungs are well-aerated. Mild vascular congestion is noted. There
is no evidence of focal opacification, pleural effusion or
pneumothorax.

The cardiomediastinal silhouette is borderline normal in size. The
patient is status post median sternotomy. No acute osseous
abnormalities are seen. A plate and screws are noted along the right
clavicle.
IMPRESSION: 1. Minimally displaced fracture of the right eighth posterolateral
rib.
2. Mild vascular congestion noted.  Lungs remain grossly clear.

## 2017-02-18 IMAGING — CT CT ANGIO CHEST
2 of 6 series · 19 of 36 positions shown · IV contrast (OMNIPAQUE 350)
Comparison: 09/16/2010

CLINICAL DATA: Chest pain and cough.

EXAM:
CT ANGIOGRAPHY CHEST WITH CONTRAST
TECHNIQUE: Multidetector CT imaging of the chest was performed using the
standard protocol during bolus administration of intravenous
contrast. Multiplanar CT image reconstructions and MIPs were
obtained to evaluate the vascular anatomy.
CONTRAST:  100mL OMNIPAQUE IOHEXOL 350 MG/ML SOLN

[Series 5: coronal mpr · coronal · 0.45mm/px · 1 of 124 slices shown]
[im 62/124  mediastinal]
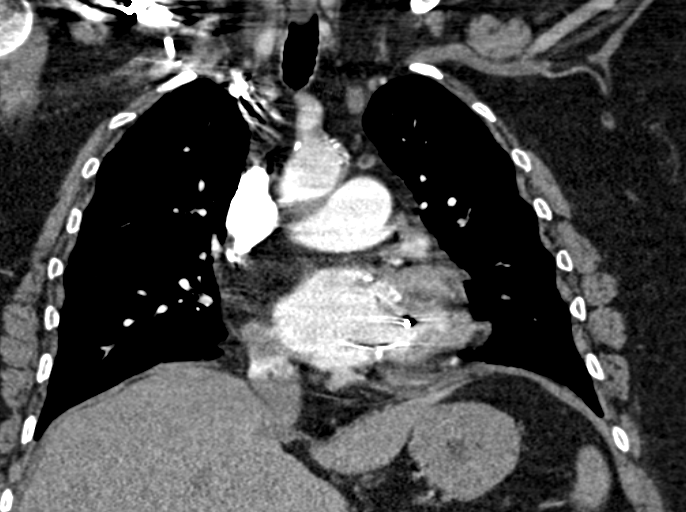

[Series 10: thins for pacs · axial · 0.74mm/px · z∈[+1373,+1580]mm · 18 of 231 slices shown]
[im 12/231  lung]
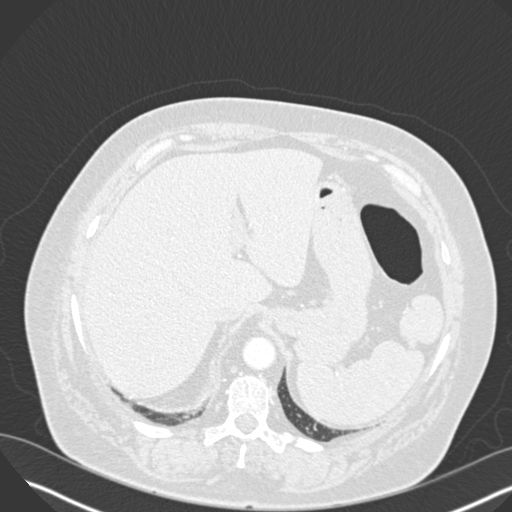
[im 24/231  mediastinal]
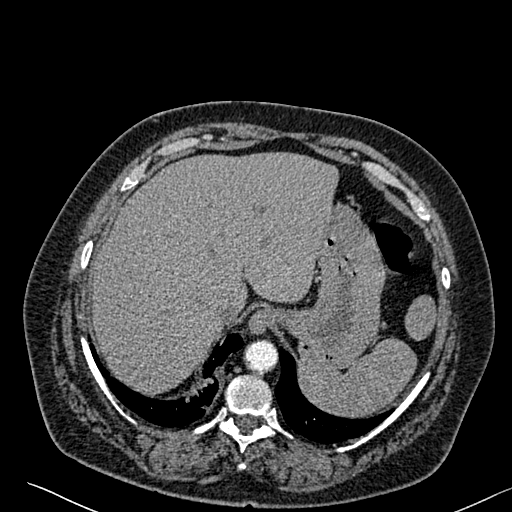
[im 35/231  lung]
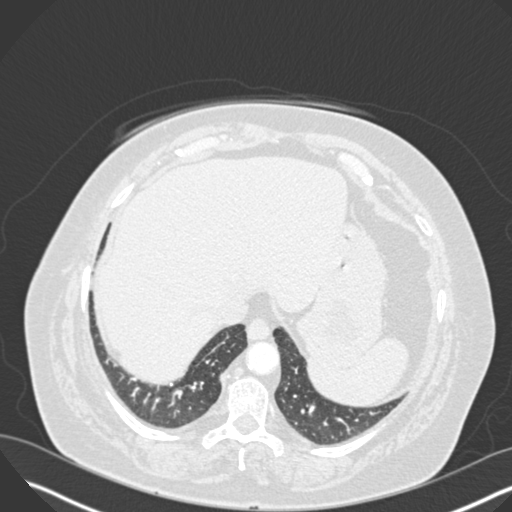
[im 47/231  mediastinal]
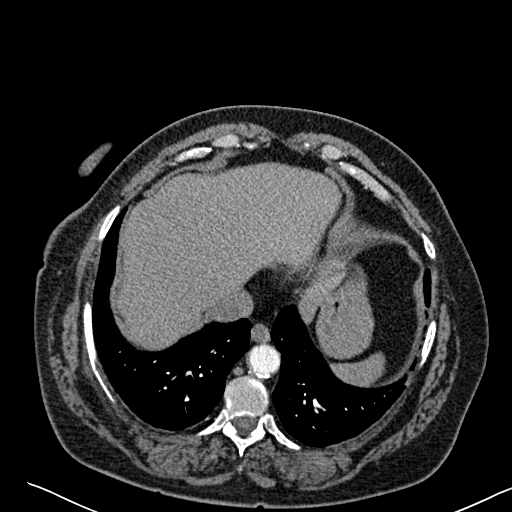
[im 58/231  lung]
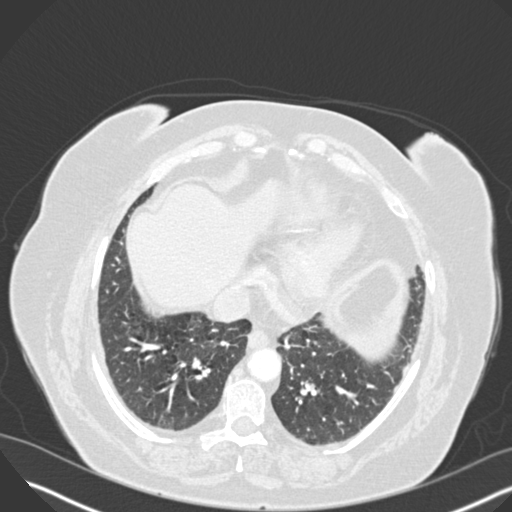
[im 70/231  mediastinal]
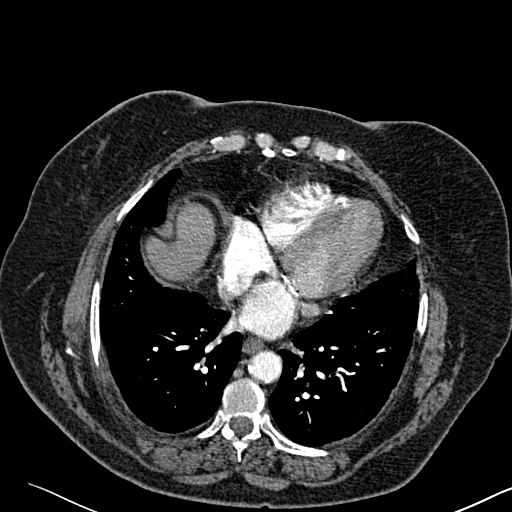
[im 81/231  lung]
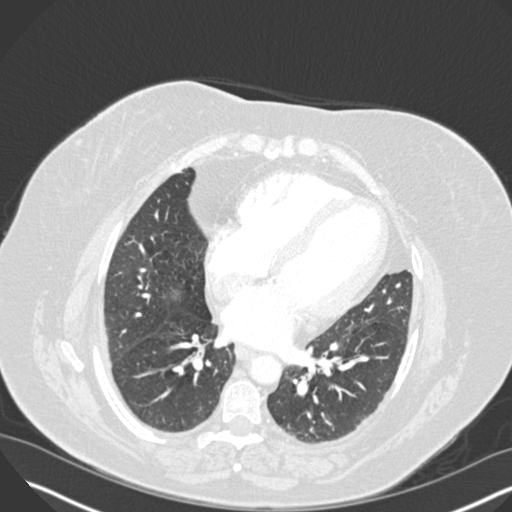
[im 93/231  mediastinal]
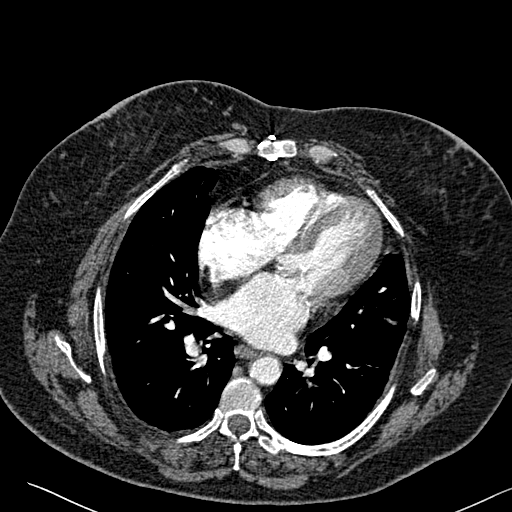
[im 104/231  lung]
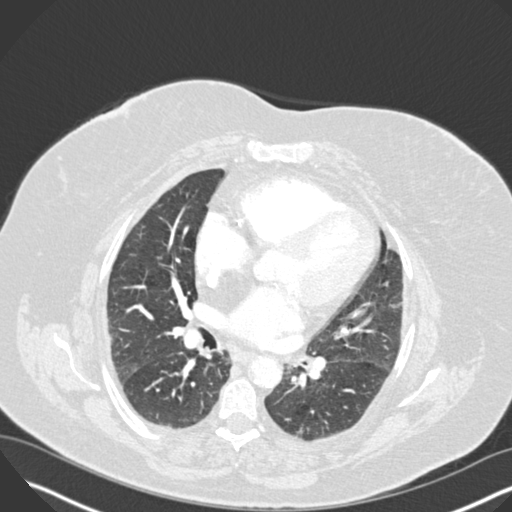
[im 127/231  mediastinal]
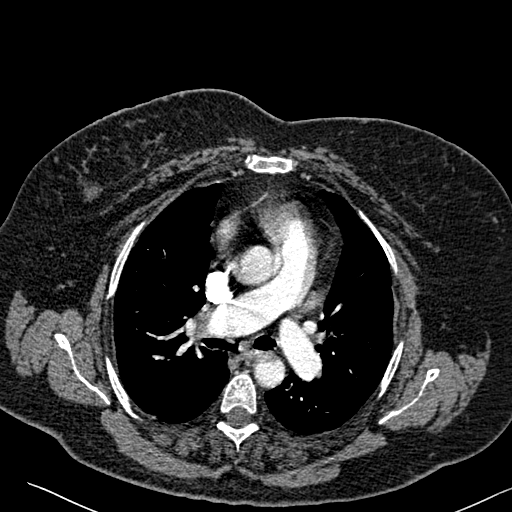
[im 139/231  lung]
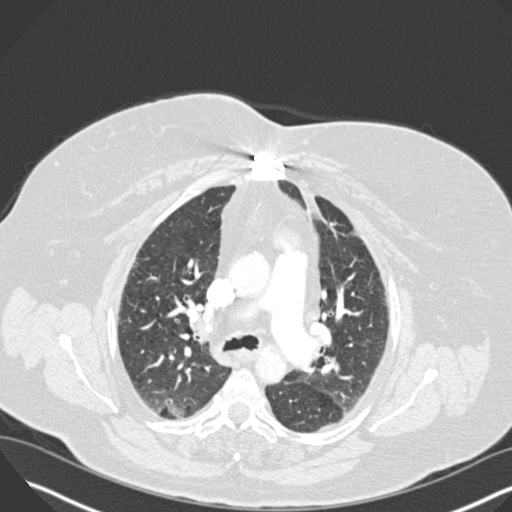
[im 150/231  mediastinal]
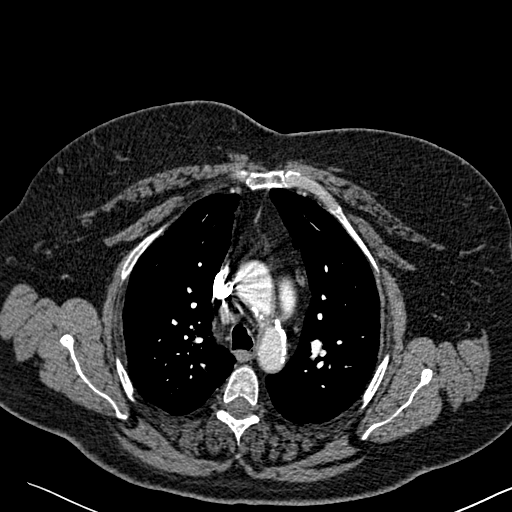
[im 162/231  lung]
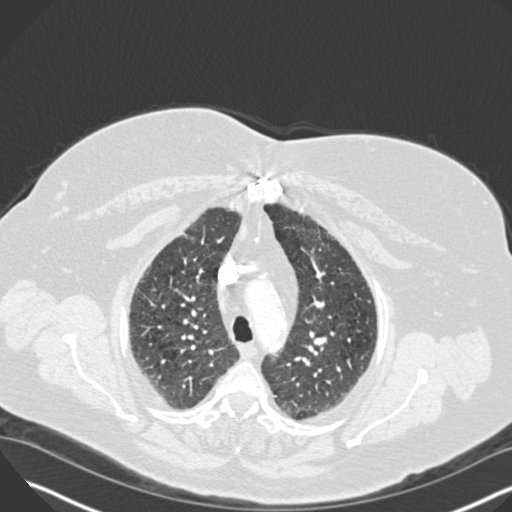
[im 173/231  mediastinal]
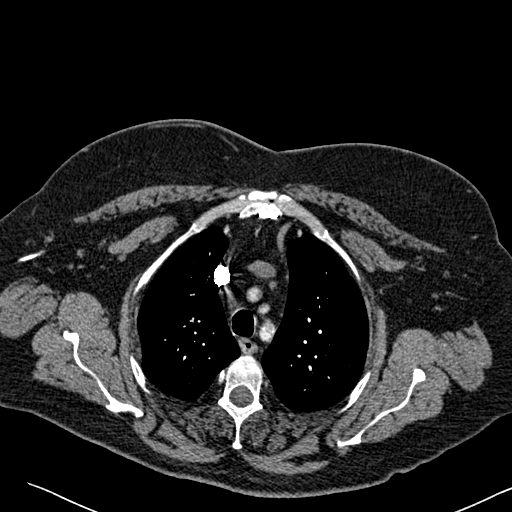
[im 185/231  lung]
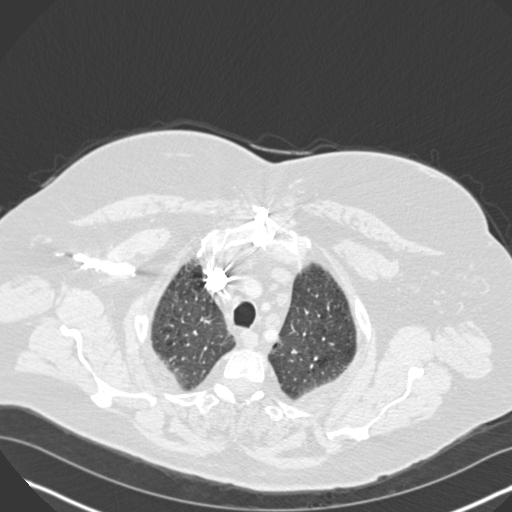
[im 196/231  mediastinal]
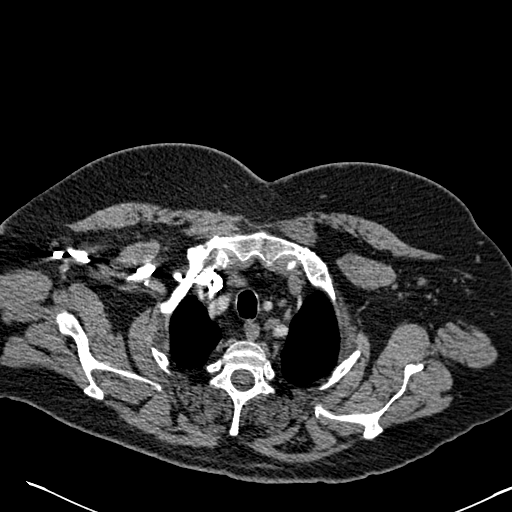
[im 208/231  lung]
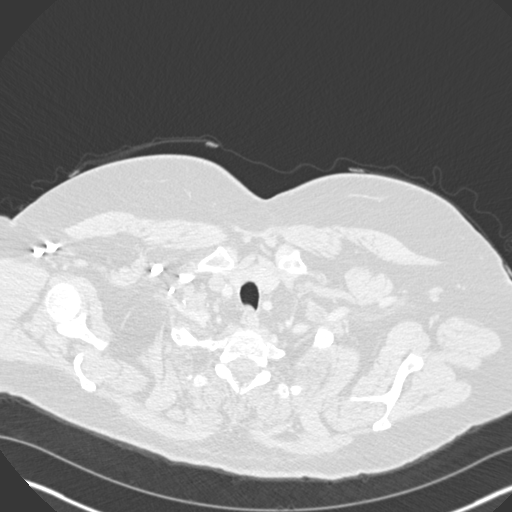
[im 219/231  mediastinal]
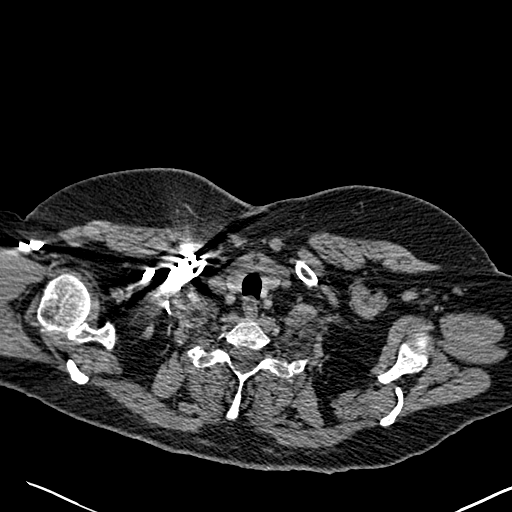

[19 of 36 positions shown; findings below may reference images not displayed]

FINDINGS: Cardiovascular: There is good opacification of the pulmonary
arteries. There is no pulmonary embolism. The thoracic aorta is
normal in caliber and intact.

Lungs: Centrilobular emphysematous changes are present, upper lobe
predominant. Minimal interstitial coarsening in the lateral
periphery of the inferior lower lobes, without confluent airspace
consolidation.

Central airways: Patent

Effusions: None

Lymphadenopathy: Small nodes in the mediastinum and both hila,
substantially decreased from 09/16/2010 and nonspecific.

Esophagus: Unremarkable

Upper abdomen: Unremarkable

Musculoskeletal: There are mildly displaced fractures of the right
sixth and eighth ribs posteriorly and posterolaterally. No
significant musculoskeletal lesion is evident.

Review of the MIP images confirms the above findings.
IMPRESSION: 1. Negative for pulmonary embolism
2. Emphysematous changes.  Mild peripheral interstitial coarsening.
3. Acute mildly displaced right sixth and right eighth rib
fractures.

## 2017-02-18 NOTE — Telephone Encounter (Signed)
Would like [name] to call back.  

## 2017-02-18 NOTE — Telephone Encounter (Signed)
Spoke w/ pt for extended amt of time, she is depressed and worried, states things are getting worse and worse. Will continue to check on her

## 2017-02-23 ENCOUNTER — Ambulatory Visit: Payer: PPO

## 2017-02-24 IMAGING — DX DG CHEST 2V
2 series · 2 of 2 positions shown · non-contrast
Comparison: Chest CT and chest x-ray on 10/13/2014

CLINICAL DATA: Right flank pain for 3 weeks. Eighth rib fracture
diagnosed on x-ray 10/13/2014. History of diabetes and smoking.
History of AFib and MVR.

EXAM:
CHEST  2 VIEW

[chest lat]
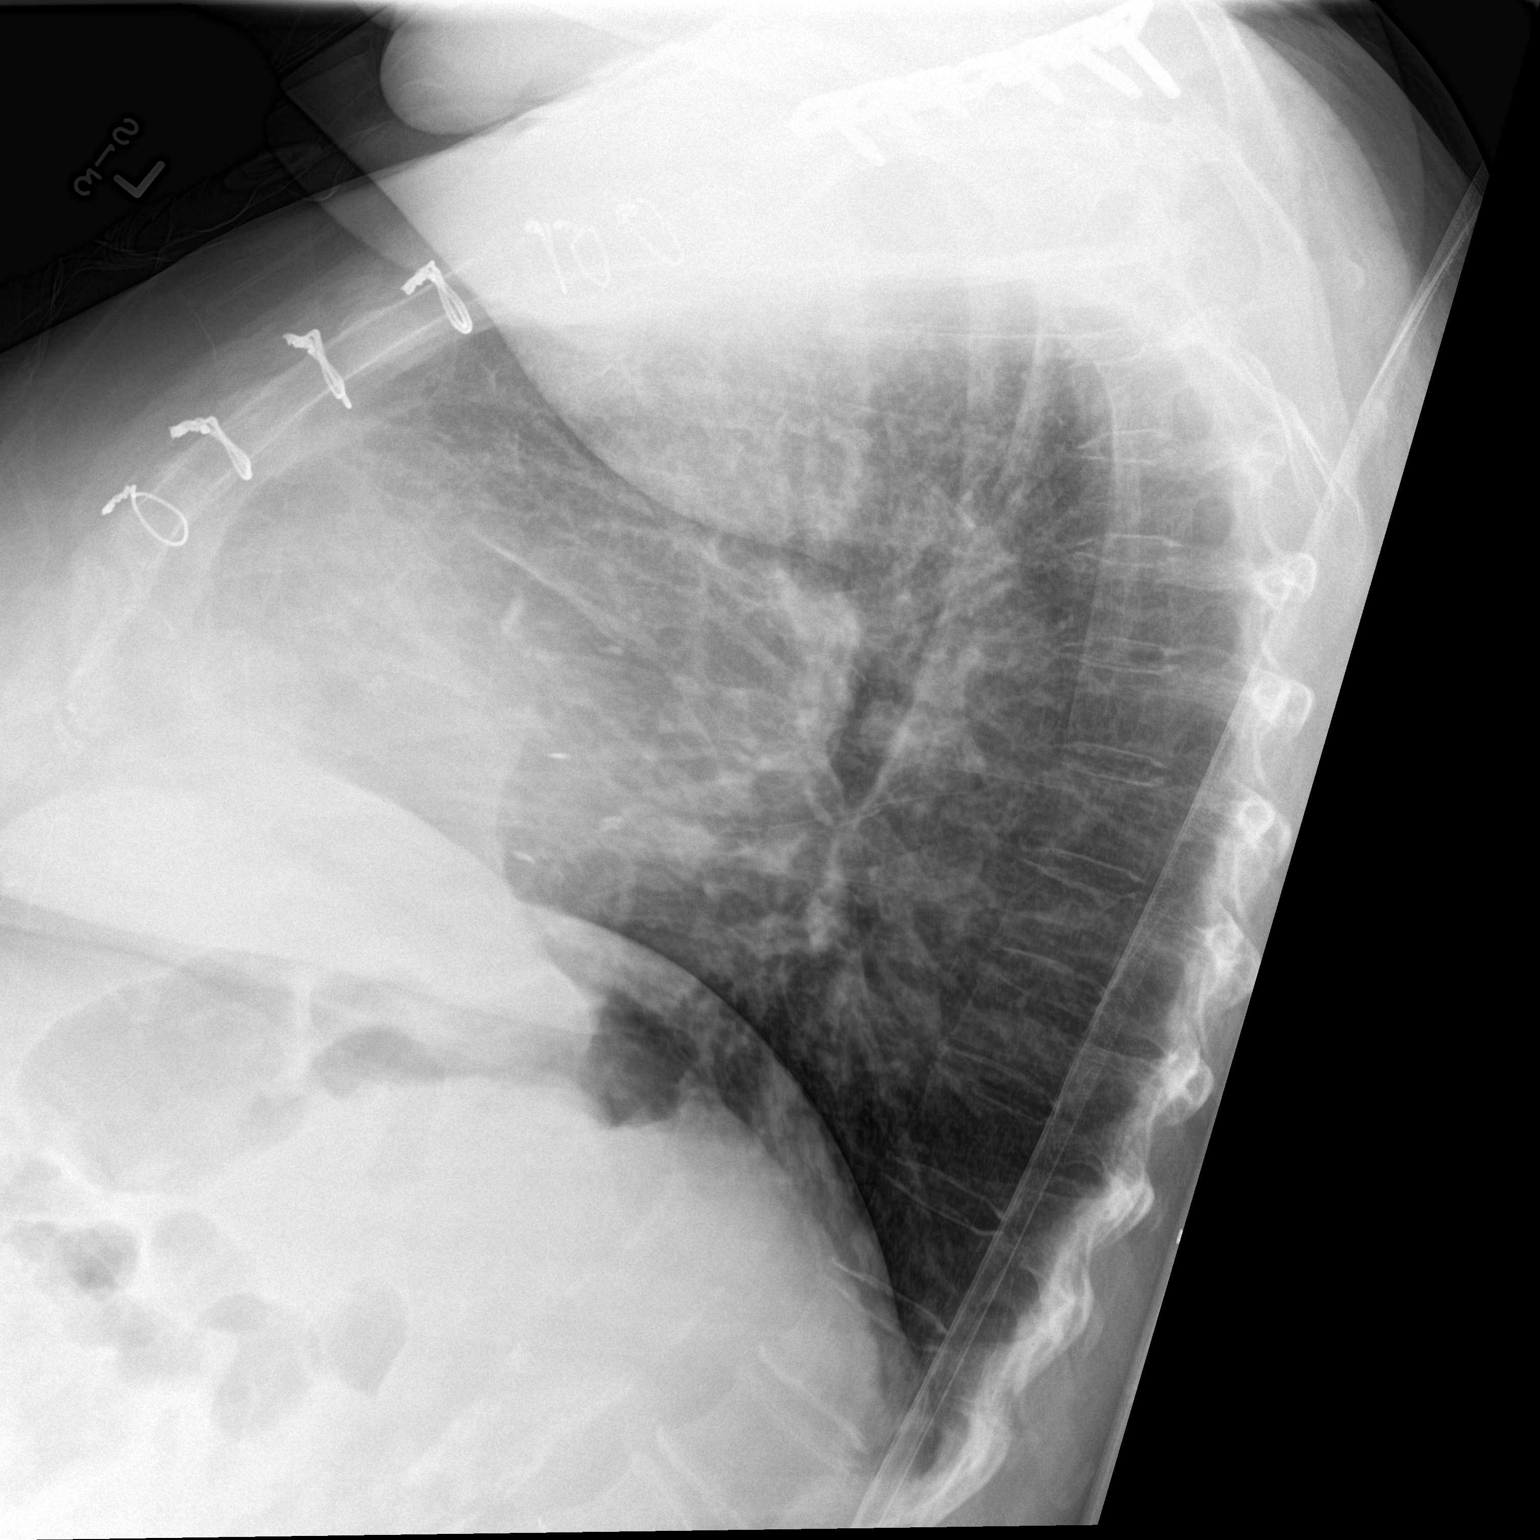

[chest ap]
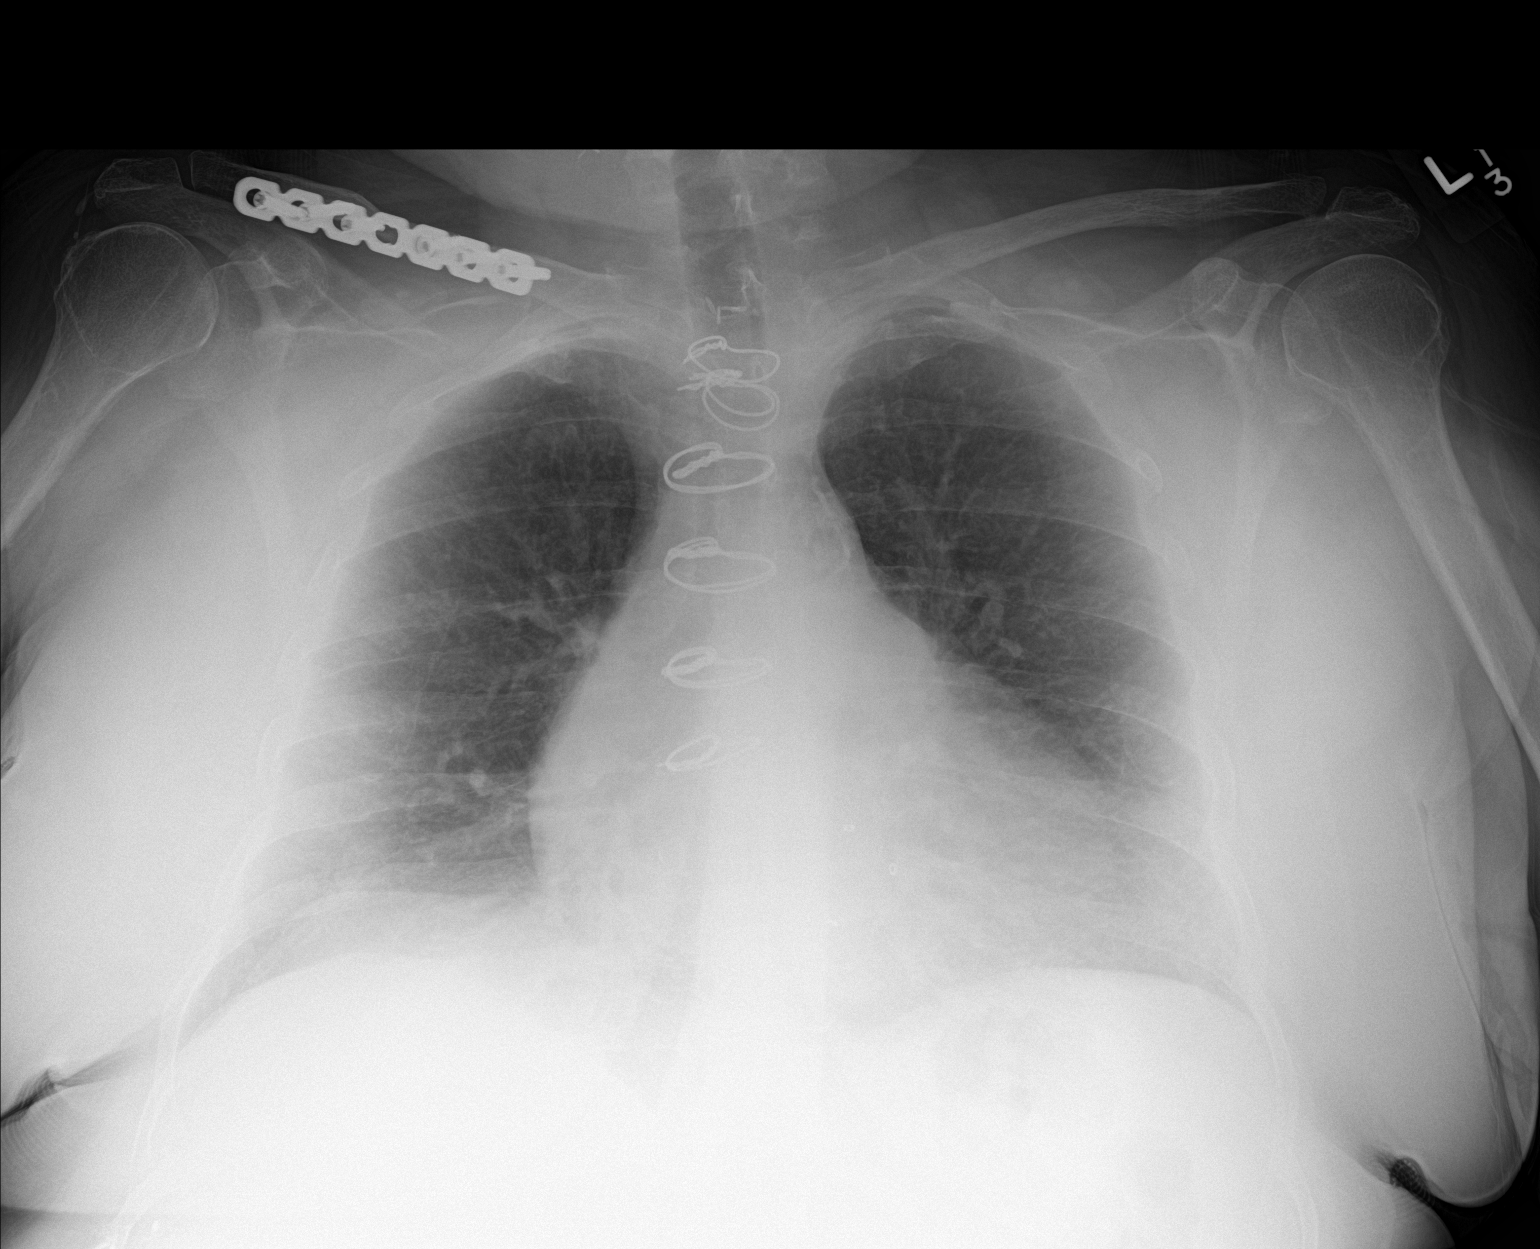

[2 of 2 positions shown; findings below may reference images not displayed]

FINDINGS: Heart is mildly enlarged. Lungs are clear. Patient has had previous
median sternotomy and right clavicle ORIF. Deformity of the right
posterior lateral sixth, seventh, and eighth ribs consistent with
subacute fractures. No pneumothorax.
IMPRESSION: Cardiomegaly.

Subacute right rib fractures.

## 2017-02-25 IMAGING — DX DG THORACIC SPINE 4+V
3 series · 3 of 3 positions shown · non-contrast
Comparison: None.

CLINICAL DATA: Back pain after fall 4 weeks ago

EXAM:
THORACIC SPINE - 4+ VIEW

[t-spine ap]
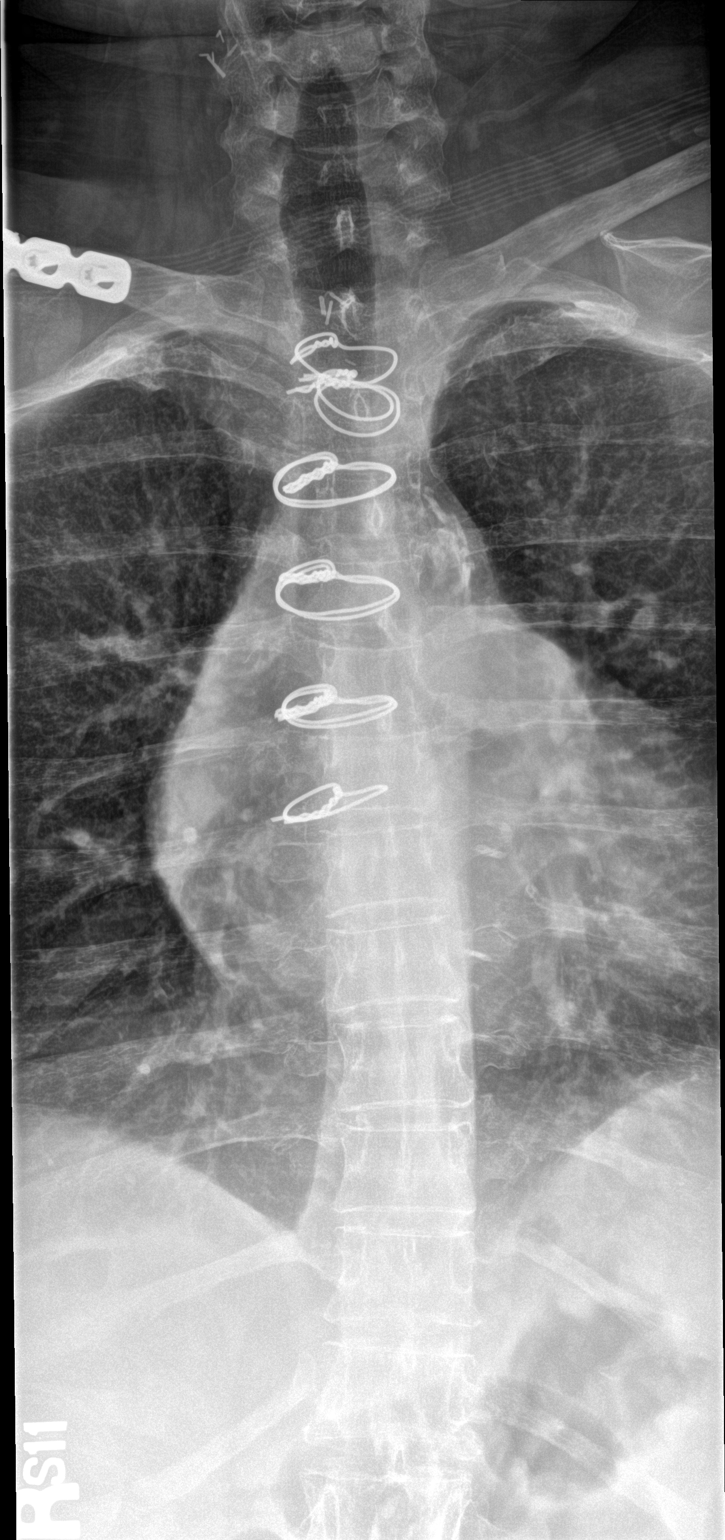

[t-spine lat]
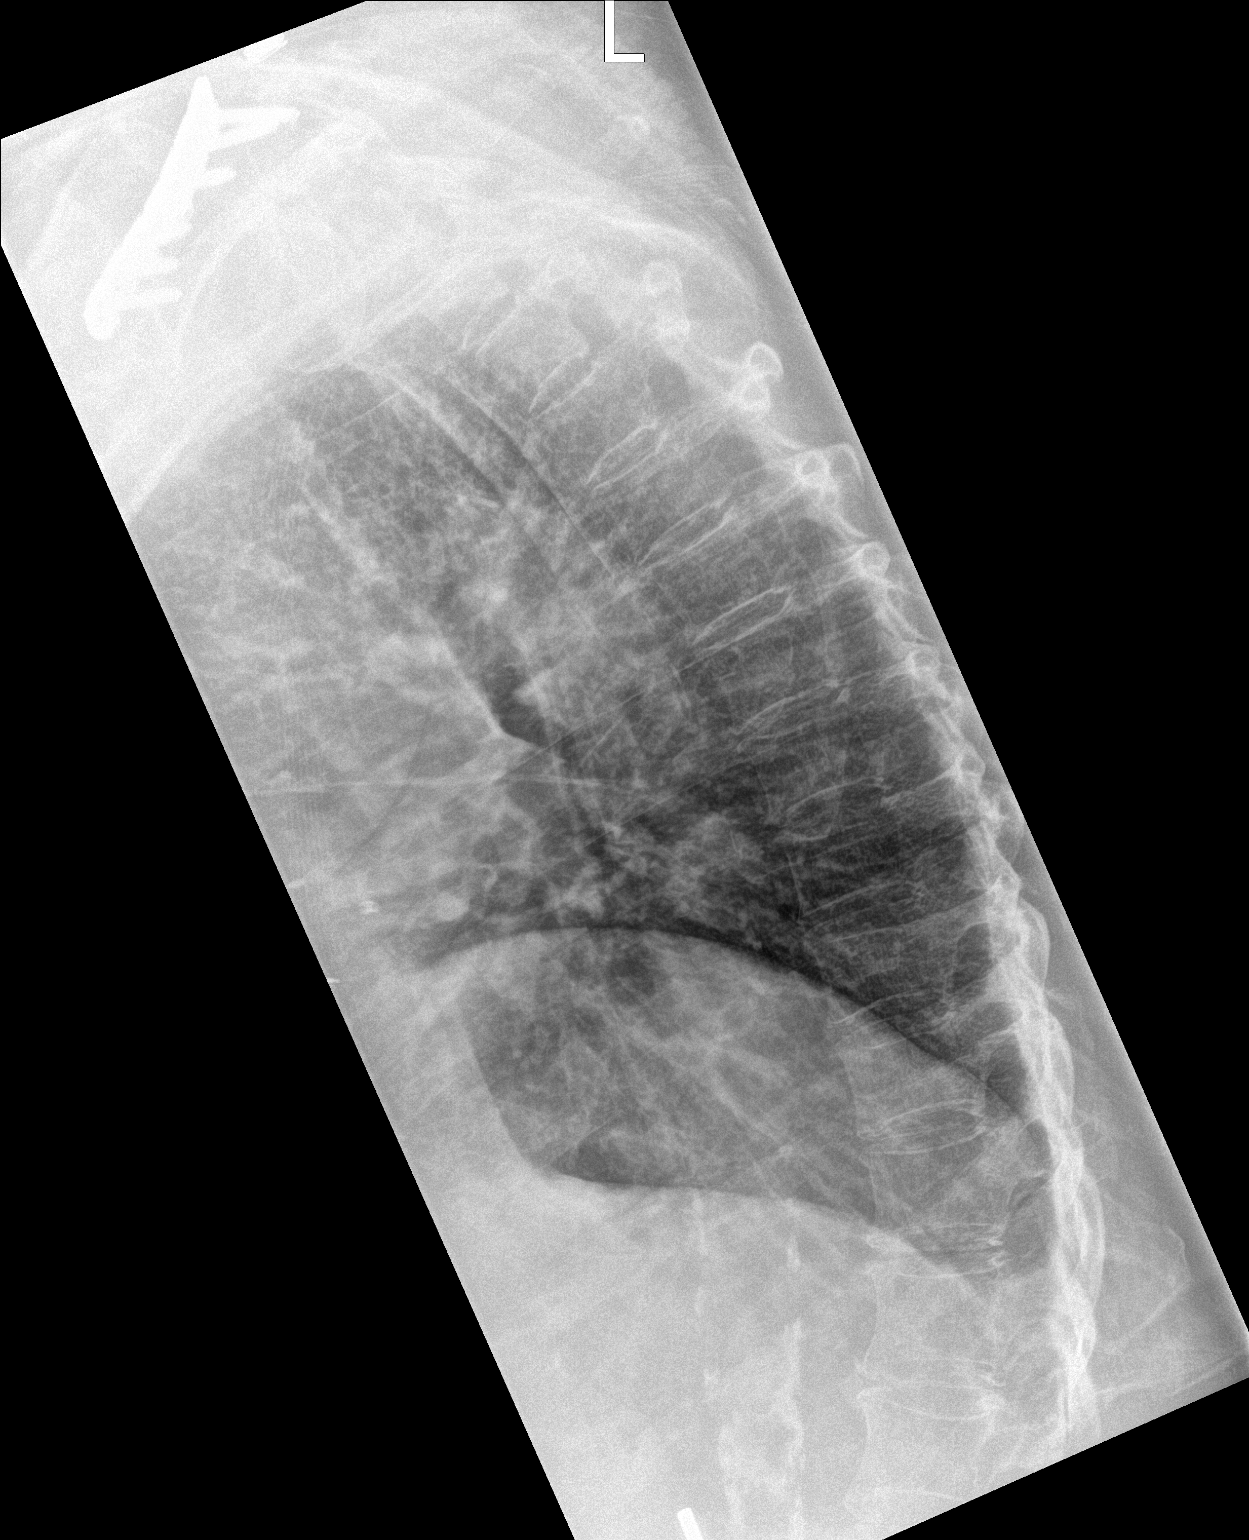

[t-spine swimmers]
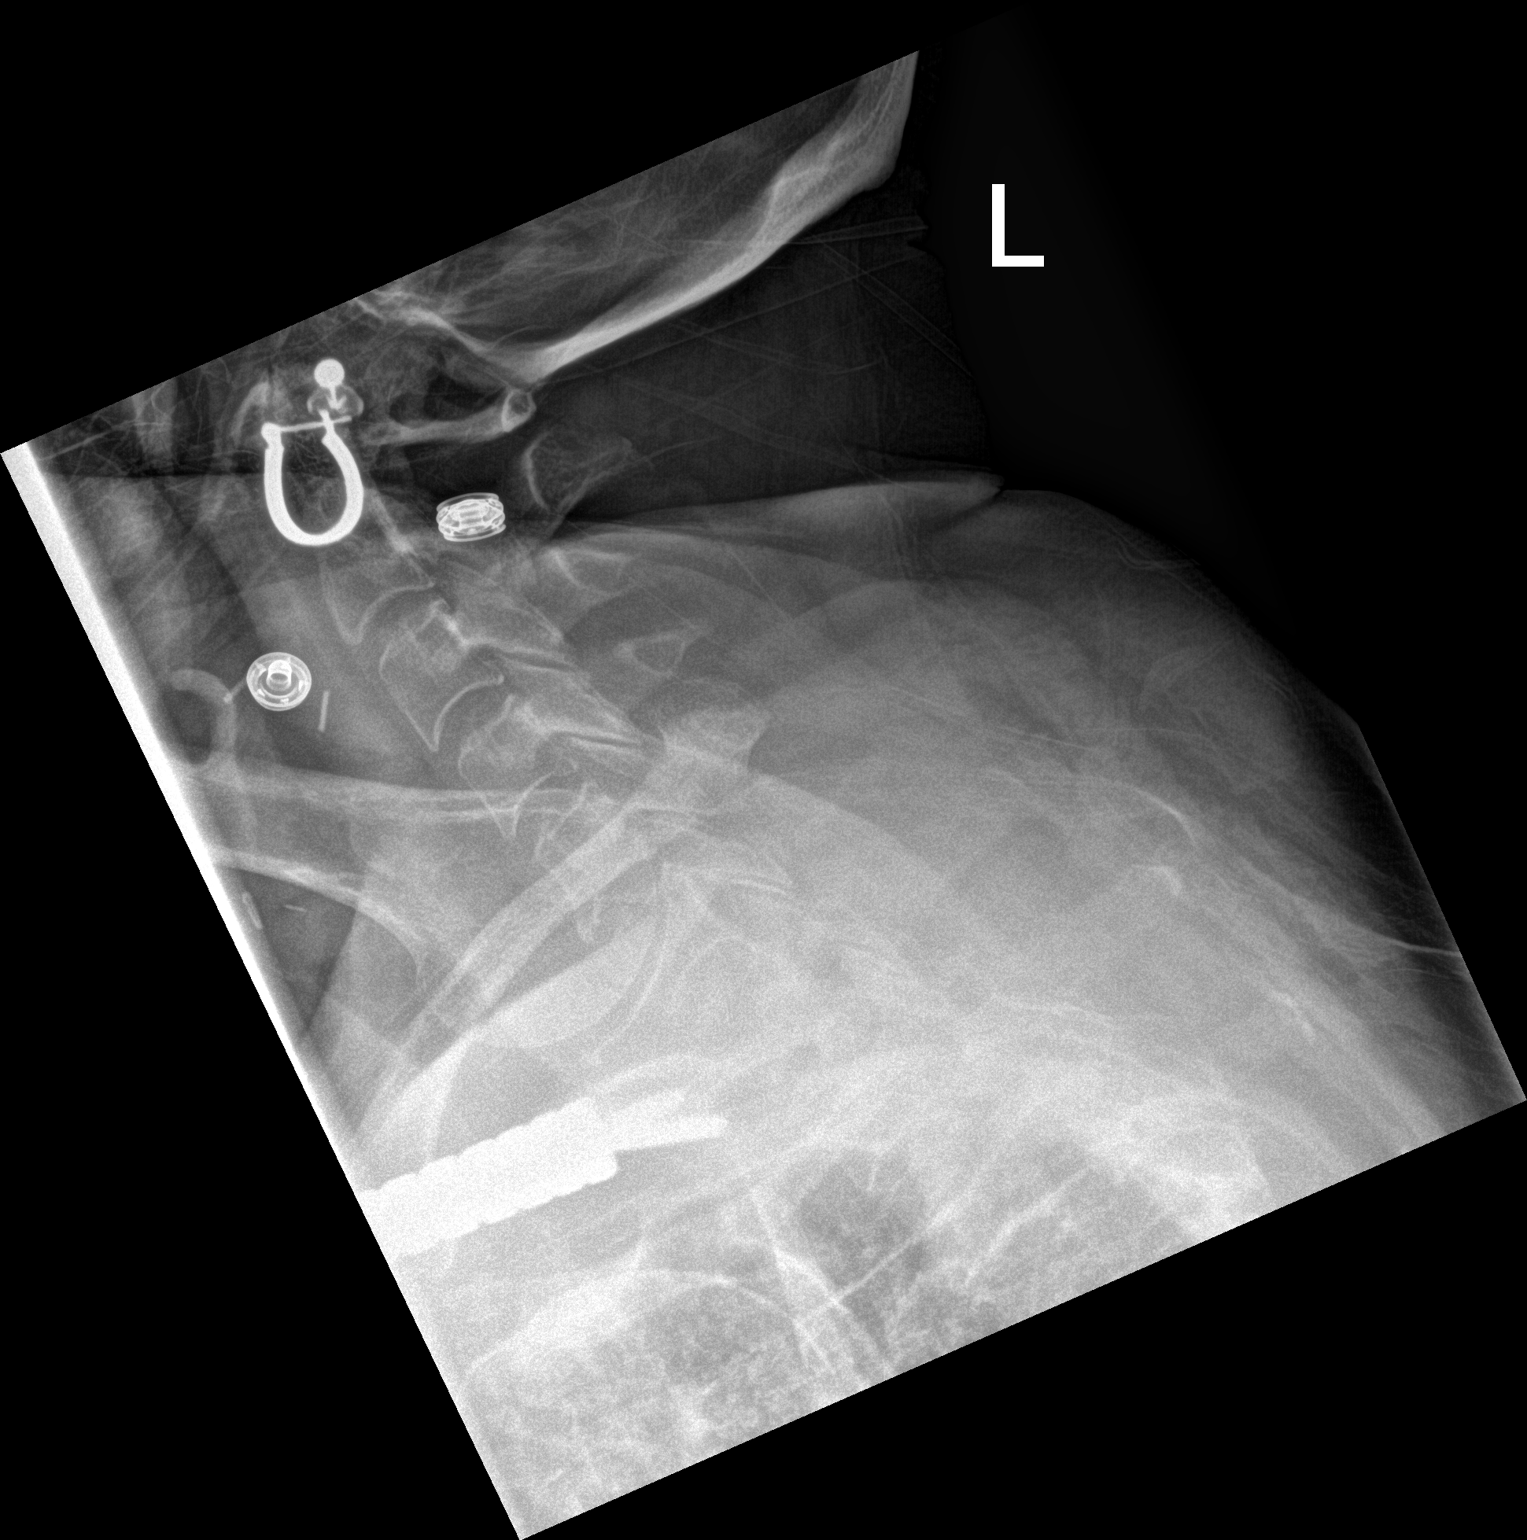

[3 of 3 positions shown; findings below may reference images not displayed]

FINDINGS: Calcification of the aorta. Normal alignment. Diffuse osteopenia.
Mild degenerative disc disease at every level through the thoracic
spine. No fracture.
IMPRESSION: No acute findings

## 2017-02-26 ENCOUNTER — Other Ambulatory Visit (HOSPITAL_COMMUNITY): Payer: Self-pay | Admitting: Cardiology

## 2017-02-26 ENCOUNTER — Other Ambulatory Visit: Payer: Self-pay | Admitting: Internal Medicine

## 2017-02-26 DIAGNOSIS — B3731 Acute candidiasis of vulva and vagina: Secondary | ICD-10-CM

## 2017-02-26 DIAGNOSIS — B373 Candidiasis of vulva and vagina: Secondary | ICD-10-CM

## 2017-02-27 NOTE — Telephone Encounter (Signed)
Called [name].  She is feeling better now.  Will schedule follow-up when she can make it (05/04/2017 at 11:15 AM).  She had to cancel next week's appointment.

## 2017-03-02 IMAGING — DX DG CHEST 2V
2 series · 2 of 2 positions shown · non-contrast
Comparison: 10/19/2014

CLINICAL DATA: Shortness of breath, COPD

EXAM:
CHEST  2 VIEW

[chest lat]
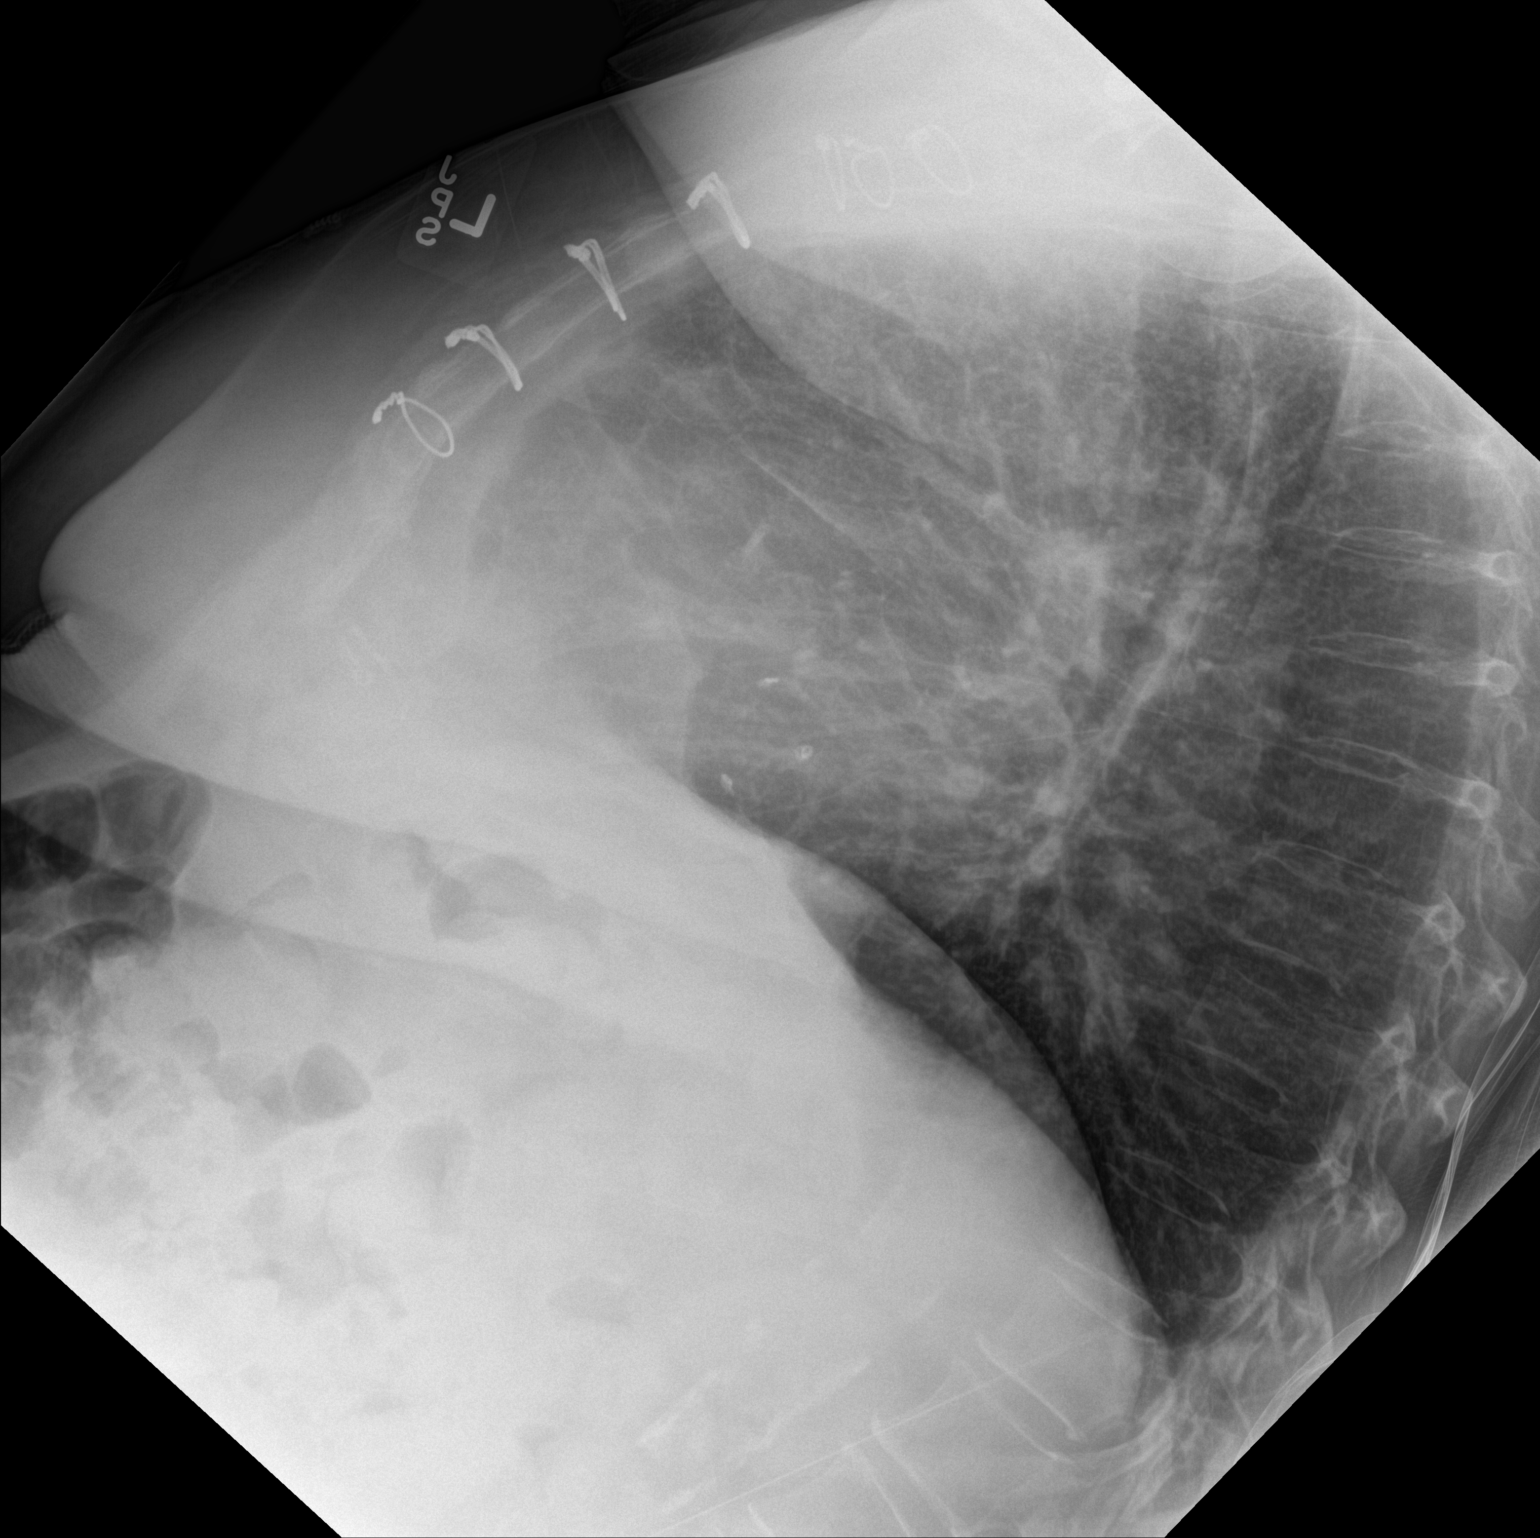

[chest ap]
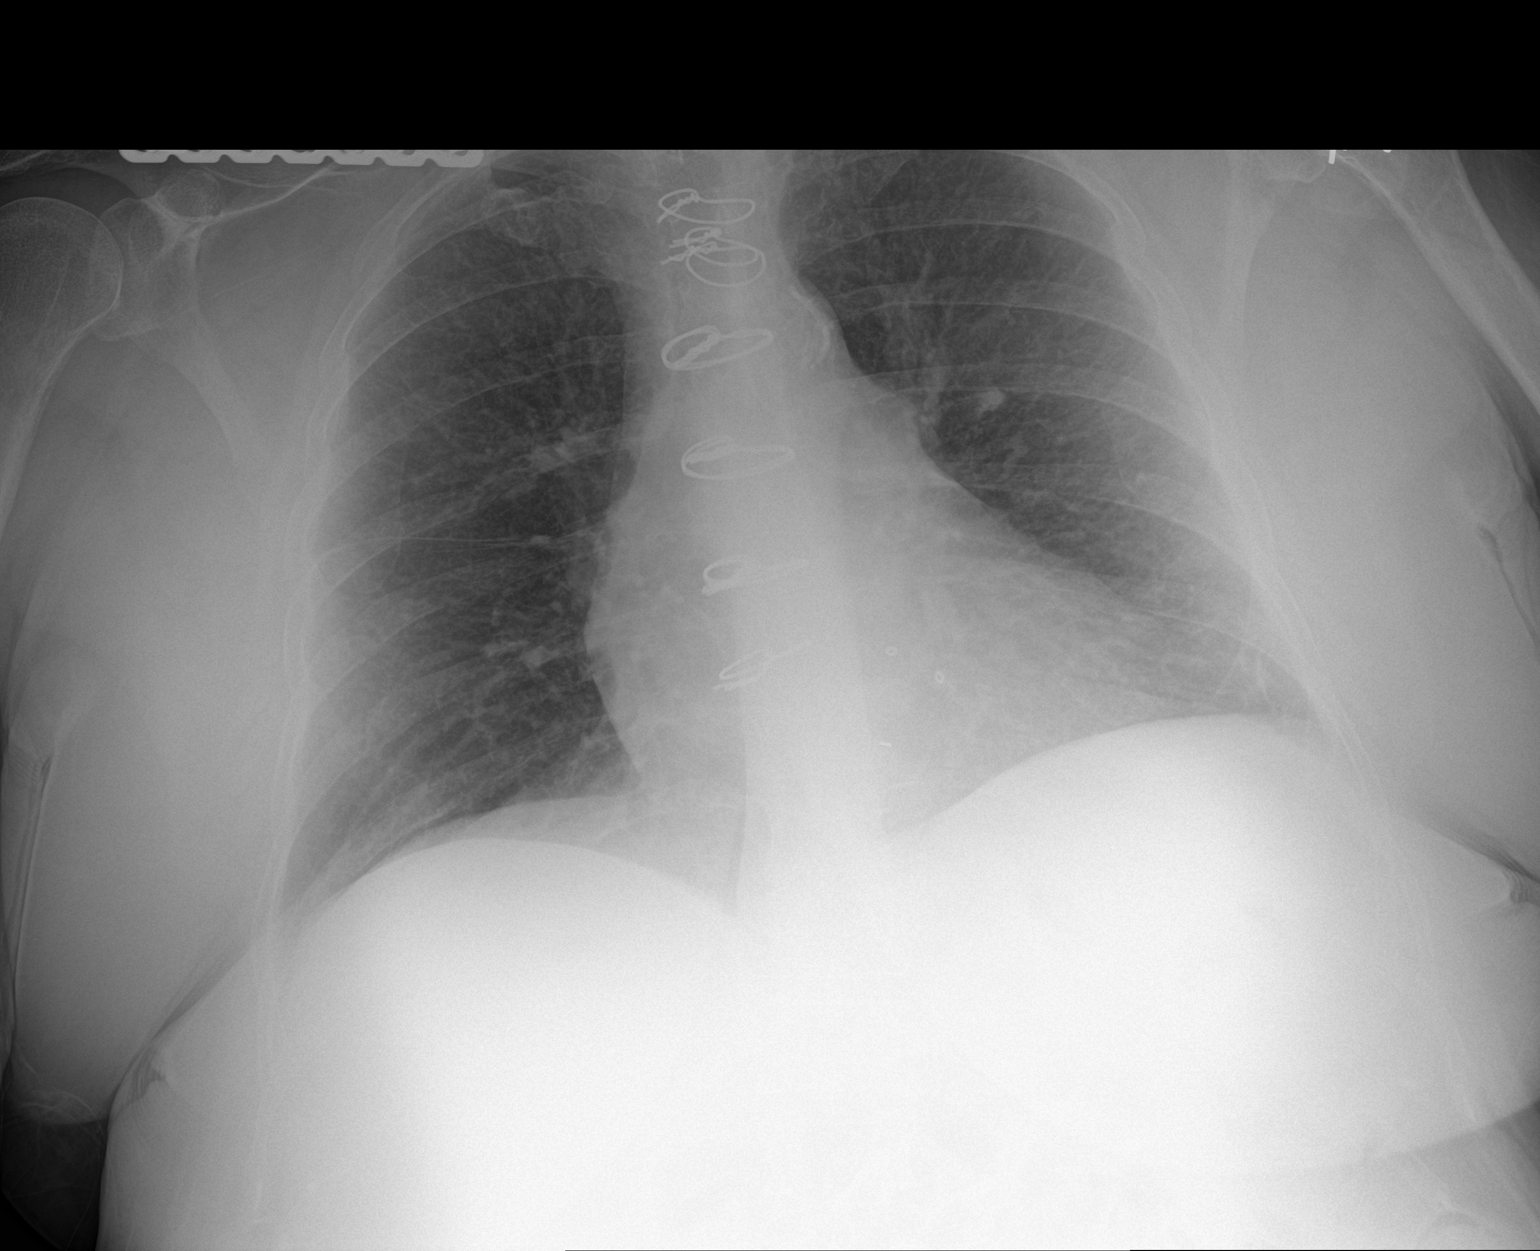

[2 of 2 positions shown; findings below may reference images not displayed]

FINDINGS: Cardiomediastinal silhouette is stable. Status post median
sternotomy. Right rib fractures are poorly visualized. Postoperative
changes with metallic fixation plate and screws in right clavicle.
No acute infiltrate or pulmonary edema. Thoracic spine osteopenia.
IMPRESSION: Status post median sternotomy. Right rib fractures are poorly
visualized. Postoperative changes with metallic fixation plate and
screws in right clavicle. No acute infiltrate or pulmonary edema.
Thoracic spine osteopenia.

## 2017-03-06 ENCOUNTER — Ambulatory Visit: Payer: PPO | Admitting: Internal Medicine

## 2017-03-10 ENCOUNTER — Ambulatory Visit (HOSPITAL_COMMUNITY): Payer: PPO

## 2017-03-15 ENCOUNTER — Telehealth: Payer: Self-pay | Admitting: Internal Medicine

## 2017-03-15 NOTE — Telephone Encounter (Addendum)
Reason for call:   I received a call from [name] at 3:25 PM indicating that she accidentally injected herself with 37 units of novolog, thinking it was her levemir.   Pertinent Data:   Patient is type II diabetic prescribed Levemir 37 units BID and Novolog 18-28 units TID with meals  Reports her CBG was 286 at 2:30 pm when she accidentally injected 37 units of novolog. Rechecked at 3:12 pm and CBG was 323.   Denies dizziness, lightheadedness, sweating, and shaking    Assessment / Plan / Recommendations:   Instructed patient to continue checking CBGs q1h for the next 3 hours  Has orange juice and candy available if CBG drops less than 120  Instructed to call EMS if she develops symptoms of hypoglycemia   Will call back to follow up CBGs in a couple of hours   As always, pt is advised that if symptoms worsen or new symptoms arise, they should go to an urgent care facility or to to ER for further evaluation.    ADDENDUM: Called patient back to follow up, CBG @ 5:30 pm was 156. She remains asymptomatic. Instructed patient to call back with any further questions.   Kathryn Ochs, MD   03/15/2017, 3:46 PM

## 2017-03-18 ENCOUNTER — Telehealth: Payer: Self-pay | Admitting: Internal Medicine

## 2017-03-18 NOTE — Telephone Encounter (Signed)
Patient is requesting Bonnita Nasuti to callback

## 2017-03-20 ENCOUNTER — Telehealth: Payer: Self-pay | Admitting: *Deleted

## 2017-03-20 NOTE — Telephone Encounter (Signed)
Patient called stating she fell today. Said she did not fall hard, hit the side of her face to the wall, said "my oxygen tubing kind of cushioned it a little bit". Not having any pain or injury, said "I'm feeling ok, it wasn't a really bad fall". She's laughing on the phone saying she has had a fun morning. Advised if new symptoms arise & become bothersome to go to ED otherwise call back clinic on Monday when we open. Verbalized understanding.

## 2017-03-24 NOTE — Telephone Encounter (Signed)
Have tried to call, lm for rtc

## 2017-03-25 NOTE — Telephone Encounter (Signed)
Pt states she needs to change appts due to transportation, changed, she states she hurts from head to toe, states she is constantly eating ice,she states she is getting more short of breath and has burnt holes in her floors, furniture and clothes, offered appt [facility] refuses "I only see dr Eppie Gibson, I only talk to donnap, kayeg. And helena, nobody else". She is reminded if she changes her mind to call and appt in [facility] will be given, she is agreeable.

## 2017-03-26 ENCOUNTER — Ambulatory Visit (HOSPITAL_COMMUNITY)
Admission: RE | Admit: 2017-03-26 | Discharge: 2017-03-26 | Disposition: A | Discharge status: Home / Self Care | Payer: PPO | Source: Ambulatory Visit | Attending: Family Medicine | Admitting: Family Medicine

## 2017-03-26 ENCOUNTER — Encounter (HOSPITAL_COMMUNITY): Payer: Self-pay | Admitting: General Practice

## 2017-03-26 ENCOUNTER — Inpatient Hospital Stay (HOSPITAL_COMMUNITY)
Admission: AD | Admit: 2017-03-26 | Discharge: 2017-03-28 | DRG: 812 | Disposition: A | Discharge status: Home / Self Care | Payer: PPO | Source: Ambulatory Visit | Attending: Internal Medicine | Admitting: Internal Medicine

## 2017-03-26 ENCOUNTER — Other Ambulatory Visit: Payer: Self-pay | Admitting: Internal Medicine

## 2017-03-26 ENCOUNTER — Ambulatory Visit (INDEPENDENT_AMBULATORY_CARE_PROVIDER_SITE_OTHER): Payer: PPO | Admitting: Internal Medicine

## 2017-03-26 ENCOUNTER — Other Ambulatory Visit: Payer: Self-pay

## 2017-03-26 VITALS — BP 142/56 | HR 84 | Temp 97.8°F | Ht 62.0 in | Wt 202.7 lb

## 2017-03-26 DIAGNOSIS — G4733 Obstructive sleep apnea (adult) (pediatric): Secondary | ICD-10-CM | POA: Diagnosis present

## 2017-03-26 DIAGNOSIS — E785 Hyperlipidemia, unspecified: Secondary | ICD-10-CM | POA: Diagnosis present

## 2017-03-26 DIAGNOSIS — D649 Anemia, unspecified: Secondary | ICD-10-CM | POA: Diagnosis present

## 2017-03-26 DIAGNOSIS — Z9981 Dependence on supplemental oxygen: Secondary | ICD-10-CM

## 2017-03-26 DIAGNOSIS — Z8249 Family history of ischemic heart disease and other diseases of the circulatory system: Secondary | ICD-10-CM

## 2017-03-26 DIAGNOSIS — E8770 Fluid overload, unspecified: Secondary | ICD-10-CM

## 2017-03-26 DIAGNOSIS — K219 Gastro-esophageal reflux disease without esophagitis: Secondary | ICD-10-CM | POA: Diagnosis present

## 2017-03-26 DIAGNOSIS — R4702 Dysphasia: Secondary | ICD-10-CM | POA: Diagnosis present

## 2017-03-26 DIAGNOSIS — R1310 Dysphagia, unspecified: Secondary | ICD-10-CM

## 2017-03-26 DIAGNOSIS — R32 Unspecified urinary incontinence: Secondary | ICD-10-CM | POA: Diagnosis present

## 2017-03-26 DIAGNOSIS — F17200 Nicotine dependence, unspecified, uncomplicated: Secondary | ICD-10-CM | POA: Diagnosis not present

## 2017-03-26 DIAGNOSIS — I2510 Atherosclerotic heart disease of native coronary artery without angina pectoris: Secondary | ICD-10-CM

## 2017-03-26 DIAGNOSIS — I480 Paroxysmal atrial fibrillation: Secondary | ICD-10-CM | POA: Diagnosis present

## 2017-03-26 DIAGNOSIS — Z7901 Long term (current) use of anticoagulants: Secondary | ICD-10-CM | POA: Diagnosis not present

## 2017-03-26 DIAGNOSIS — J301 Allergic rhinitis due to pollen: Secondary | ICD-10-CM

## 2017-03-26 DIAGNOSIS — E669 Obesity, unspecified: Secondary | ICD-10-CM | POA: Diagnosis not present

## 2017-03-26 DIAGNOSIS — E1140 Type 2 diabetes mellitus with diabetic neuropathy, unspecified: Secondary | ICD-10-CM | POA: Diagnosis present

## 2017-03-26 DIAGNOSIS — I272 Pulmonary hypertension, unspecified: Secondary | ICD-10-CM | POA: Diagnosis not present

## 2017-03-26 DIAGNOSIS — K5521 Angiodysplasia of colon with hemorrhage: Secondary | ICD-10-CM | POA: Diagnosis not present

## 2017-03-26 DIAGNOSIS — D500 Iron deficiency anemia secondary to blood loss (chronic): Principal | ICD-10-CM | POA: Diagnosis present

## 2017-03-26 DIAGNOSIS — Z9889 Other specified postprocedural states: Secondary | ICD-10-CM | POA: Diagnosis not present

## 2017-03-26 DIAGNOSIS — Z9114 Patient's other noncompliance with medication regimen: Secondary | ICD-10-CM

## 2017-03-26 DIAGNOSIS — Z952 Presence of prosthetic heart valve: Secondary | ICD-10-CM

## 2017-03-26 DIAGNOSIS — R5383 Other fatigue: Secondary | ICD-10-CM | POA: Diagnosis present

## 2017-03-26 DIAGNOSIS — Z794 Long term (current) use of insulin: Secondary | ICD-10-CM | POA: Diagnosis not present

## 2017-03-26 DIAGNOSIS — M810 Age-related osteoporosis without current pathological fracture: Secondary | ICD-10-CM | POA: Diagnosis present

## 2017-03-26 DIAGNOSIS — D638 Anemia in other chronic diseases classified elsewhere: Secondary | ICD-10-CM | POA: Diagnosis not present

## 2017-03-26 DIAGNOSIS — M797 Fibromyalgia: Secondary | ICD-10-CM | POA: Diagnosis present

## 2017-03-26 DIAGNOSIS — I5032 Chronic diastolic (congestive) heart failure: Secondary | ICD-10-CM | POA: Diagnosis present

## 2017-03-26 DIAGNOSIS — J449 Chronic obstructive pulmonary disease, unspecified: Secondary | ICD-10-CM | POA: Diagnosis present

## 2017-03-26 DIAGNOSIS — Z85528 Personal history of other malignant neoplasm of kidney: Secondary | ICD-10-CM | POA: Diagnosis not present

## 2017-03-26 DIAGNOSIS — F17210 Nicotine dependence, cigarettes, uncomplicated: Secondary | ICD-10-CM | POA: Diagnosis present

## 2017-03-26 DIAGNOSIS — E119 Type 2 diabetes mellitus without complications: Secondary | ICD-10-CM | POA: Diagnosis not present

## 2017-03-26 HISTORY — DX: Dependence on other enabling machines and devices: Z99.89

## 2017-03-26 HISTORY — DX: Obstructive sleep apnea (adult) (pediatric): G47.33

## 2017-03-26 LAB — COMPREHENSIVE METABOLIC PANEL
ALT: 16 U/L (ref 14–54)
AST: 18 U/L (ref 15–41)
Albumin: 3.4 g/dL — ABNORMAL LOW (ref 3.5–5.0)
Alkaline Phosphatase: 62 U/L (ref 38–126)
Anion gap: 10 (ref 5–15)
BUN: 12 mg/dL (ref 6–20)
CO2: 26 mmol/L (ref 22–32)
Calcium: 9.2 mg/dL (ref 8.9–10.3)
Chloride: 102 mmol/L (ref 101–111)
Creatinine, Ser: 0.92 mg/dL (ref 0.44–1.00)
GFR calc Af Amer: >60 mL/min (ref >60)
GFR calc non Af Amer: >60 mL/min (ref >60)
Glucose, Bld: 144 mg/dL — ABNORMAL HIGH (ref 65–99)
Potassium: 4.5 mmol/L (ref 3.5–5.1)
Sodium: 138 mmol/L (ref 135–145)
Total Bilirubin: 0.4 mg/dL (ref 0.3–1.2)
Total Protein: 6.5 g/dL (ref 6.5–8.1)

## 2017-03-26 LAB — IRON AND TIBC
Iron: 13 ug/dL — ABNORMAL LOW (ref 28–170)
Saturation Ratios: 3 % — ABNORMAL LOW (ref 10.4–31.8)
TIBC: 462 ug/dL — ABNORMAL HIGH (ref 250–450)
UIBC: 449 ug/dL

## 2017-03-26 LAB — CBC
HCT: 25.1 % — ABNORMAL LOW (ref 36.0–46.0)
Hemoglobin: 7.2 g/dL — ABNORMAL LOW (ref 12.0–15.0)
MCH: 23.2 pg — ABNORMAL LOW (ref 26.0–34.0)
MCHC: 28.7 g/dL — ABNORMAL LOW (ref 30.0–36.0)
MCV: 81 fL (ref 78.0–100.0)
Platelets: 229 10*3/uL (ref 150–400)
RBC: 3.1 MIL/uL — ABNORMAL LOW (ref 3.87–5.11)
RDW: 19.4 % — ABNORMAL HIGH (ref 11.5–15.5)
WBC: 9.8 10*3/uL (ref 4.0–10.5)

## 2017-03-26 LAB — PREPARE RBC (CROSSMATCH)

## 2017-03-26 LAB — GLUCOSE, CAPILLARY: Glucose-Capillary: 173 mg/dL — ABNORMAL HIGH (ref 65–99)

## 2017-03-26 LAB — PROTIME-INR
INR: 1.56
Prothrombin Time: 18.5 seconds — ABNORMAL HIGH (ref 11.4–15.2)

## 2017-03-26 LAB — TROPONIN I: Troponin I: <0.03 ng/mL (ref <0.03)

## 2017-03-26 LAB — FERRITIN: Ferritin: 5 ng/mL — ABNORMAL LOW (ref 11–307)

## 2017-03-26 MED ORDER — PANTOPRAZOLE SODIUM 40 MG PO TBEC
40.0000 mg | DELAYED_RELEASE_TABLET | Freq: Every day | ORAL | Status: DC
  Administered 2017-03-26 – 2017-03-28 (×3): 40 mg via ORAL
  Filled 2017-03-26 (×3): qty 1

## 2017-03-26 MED ORDER — ACETAMINOPHEN 325 MG PO TABS: 650.0000 mg | ORAL_TABLET | Freq: Four times a day (QID) | ORAL | Status: DC | PRN

## 2017-03-26 MED ORDER — DULOXETINE HCL 60 MG PO CPEP
60.0000 mg | ORAL_CAPSULE | Freq: Two times a day (BID) | ORAL | Status: DC
Start: 2017-03-26 — End: 2017-03-28
  Administered 2017-03-26 – 2017-03-28 (×4): 60 mg via ORAL
  Filled 2017-03-26 (×4): qty 1

## 2017-03-26 MED ORDER — MOMETASONE FURO-FORMOTEROL FUM 200-5 MCG/ACT IN AERO
2.0000 | INHALATION_SPRAY | Freq: Two times a day (BID) | RESPIRATORY_TRACT | Status: DC
  Administered 2017-03-26 – 2017-03-28 (×3): 2 via RESPIRATORY_TRACT
  Filled 2017-03-26: qty 8.8

## 2017-03-26 MED ORDER — ROSUVASTATIN CALCIUM 20 MG PO TABS
20.0000 mg | ORAL_TABLET | Freq: Every day | ORAL | Status: DC
  Administered 2017-03-26 – 2017-03-27 (×2): 20 mg via ORAL
  Filled 2017-03-26 (×2): qty 1

## 2017-03-26 MED ORDER — METOPROLOL SUCCINATE ER 25 MG PO TB24
25.0000 mg | ORAL_TABLET | Freq: Every day | ORAL | Status: DC
  Administered 2017-03-27 – 2017-03-28 (×2): 25 mg via ORAL
  Filled 2017-03-26 (×3): qty 1

## 2017-03-26 MED ORDER — SODIUM CHLORIDE 0.9 % IV SOLN
Freq: Once | INTRAVENOUS | Status: AC
  Administered 2017-03-26: 23:00:00 via INTRAVENOUS

## 2017-03-26 MED ORDER — CLOBETASOL PROPIONATE 0.05 % EX CREA
1.0000 "application " | TOPICAL_CREAM | Freq: Two times a day (BID) | CUTANEOUS | Status: DC
  Administered 2017-03-26 – 2017-03-28 (×4): 1 via TOPICAL
  Filled 2017-03-26: qty 15

## 2017-03-26 MED ORDER — SODIUM CHLORIDE 0.9 % IV SOLN: Freq: Once | INTRAVENOUS | Status: DC

## 2017-03-26 MED ORDER — BUPROPION HCL 75 MG PO TABS
75.0000 mg | ORAL_TABLET | Freq: Two times a day (BID) | ORAL | Status: DC
  Administered 2017-03-26 – 2017-03-28 (×4): 75 mg via ORAL
  Filled 2017-03-26 (×5): qty 1

## 2017-03-26 MED ORDER — ALPRAZOLAM 0.5 MG PO TABS
0.5000 mg | ORAL_TABLET | Freq: Two times a day (BID) | ORAL | Status: DC | PRN
  Administered 2017-03-27: 0.5 mg via ORAL
  Filled 2017-03-26: qty 1

## 2017-03-26 MED ORDER — ACETAMINOPHEN 650 MG RE SUPP: 650.0000 mg | Freq: Four times a day (QID) | RECTAL | Status: DC | PRN

## 2017-03-26 MED ORDER — FERROUS GLUCONATE 324 (38 FE) MG PO TABS
324.0000 mg | ORAL_TABLET | Freq: Two times a day (BID) | ORAL | Status: DC
  Administered 2017-03-27 – 2017-03-28 (×3): 324 mg via ORAL
  Filled 2017-03-26 (×3): qty 1

## 2017-03-26 MED ORDER — TIOTROPIUM BROMIDE MONOHYDRATE 18 MCG IN CAPS
18.0000 ug | ORAL_CAPSULE | Freq: Every day | RESPIRATORY_TRACT | Status: DC
Start: 2017-03-26 — End: 2017-03-28
  Administered 2017-03-27 – 2017-03-28 (×2): 18 ug via RESPIRATORY_TRACT
  Filled 2017-03-26: qty 5

## 2017-03-26 MED ORDER — FUROSEMIDE 40 MG PO TABS
40.0000 mg | ORAL_TABLET | Freq: Every day | ORAL | Status: DC
  Administered 2017-03-27 – 2017-03-28 (×2): 40 mg via ORAL
  Filled 2017-03-26 (×2): qty 1

## 2017-03-26 MED ORDER — NICOTINE 14 MG/24HR TD PT24
14.0000 mg | MEDICATED_PATCH | Freq: Every day | TRANSDERMAL | Status: DC
  Administered 2017-03-26 – 2017-03-28 (×3): 14 mg via TRANSDERMAL
  Filled 2017-03-26 (×3): qty 1

## 2017-03-26 MED ORDER — WARFARIN SODIUM 2.5 MG PO TABS
2.5000 mg | ORAL_TABLET | Freq: Every day | ORAL | Status: DC
  Administered 2017-03-27: 2.5 mg via ORAL
  Filled 2017-03-26: qty 1

## 2017-03-26 MED ORDER — GABAPENTIN 300 MG PO CAPS
600.0000 mg | ORAL_CAPSULE | Freq: Three times a day (TID) | ORAL | Status: DC
  Administered 2017-03-26 – 2017-03-28 (×5): 600 mg via ORAL
  Filled 2017-03-26 (×5): qty 2

## 2017-03-26 MED ORDER — WARFARIN - PHYSICIAN DOSING INPATIENT: Freq: Every day | Status: DC

## 2017-03-26 MED ORDER — POLYETHYLENE GLYCOL 3350 17 G PO PACK
17.0000 g | PACK | Freq: Every day | ORAL | Status: DC
  Administered 2017-03-26 – 2017-03-28 (×3): 17 g via ORAL
  Filled 2017-03-26 (×3): qty 1

## 2017-03-26 MED ORDER — HYDROCODONE-ACETAMINOPHEN 5-325 MG PO TABS
1.0000 | ORAL_TABLET | Freq: Four times a day (QID) | ORAL | Status: DC | PRN
  Administered 2017-03-26 – 2017-03-28 (×5): 2 via ORAL
  Filled 2017-03-26 (×5): qty 2

## 2017-03-26 MED ORDER — ALBUTEROL SULFATE (2.5 MG/3ML) 0.083% IN NEBU
3.0000 mL | INHALATION_SOLUTION | Freq: Four times a day (QID) | RESPIRATORY_TRACT | Status: DC | PRN
Start: 2017-03-26 — End: 2017-03-27
  Administered 2017-03-27: 3 mL via RESPIRATORY_TRACT
  Filled 2017-03-26 (×2): qty 3

## 2017-03-26 NOTE — Assessment & Plan Note (Signed)
Describes PICA which began 3 weeks ago and has become associated with exhaustion, shortness of breath, generalized weakness and lightheadedness. She denies melena, diarrhea, hematemesis, hemoptysis, melena, or vaginal bleeding. She is on chronic anticoagulant for paroxysmal atrial fibrillation for high risk of stroke.  - considered orthostatic vitals however she feels too unwell to attempt these  - EKG with improvement in T wave flattening when compared to prior, no acute ST changes evident, q waves in aVR and V1 unchanged from prior   - Stat Troponin I <0.03  - Admit to observation for blood transfusion for symptomatic anemia

## 2017-03-26 NOTE — Assessment & Plan Note (Signed)
>>  ASSESSMENT AND PLAN FOR CHRONIC CHF (CONGESTIVE HEART FAILURE) (HCC) WRITTEN ON 03/26/2017  5:35 PM BY BLUM, NINA, MD  Symptoms of volume overload such as shortness of breath and pleuritic chest pain. She has peripheral edema and abdominal distention on exam. She has missed multiple doses of lasix, has taken it twice in the past week, etiology is most likely med non compliance. Says that it is difficult to take this because she has to wear adult briefs and pads to avoid soiling herself because she cannot make it to the restroom in time. Briefs are complicated by candida and lichen sclerosis which is very uncomfortable for her.  - consider need for diuresis during admission as she will need blood transfusion and may have worsening of the volume overload

## 2017-03-26 NOTE — Assessment & Plan Note (Addendum)
Symptoms of volume overload such as shortness of breath and pleuritic chest pain. She has peripheral edema and abdominal distention on exam. She has missed multiple doses of lasix, has taken it twice in the past week, etiology is most likely med non compliance. Says that it is difficult to take this because she has to wear adult briefs and pads to avoid soiling herself because she cannot make it to the restroom in time. Briefs are complicated by candida and lichen sclerosis which is very uncomfortable for her.  - consider need for diuresis during admission as she will need blood transfusion and may have worsening of the volume overload

## 2017-03-26 NOTE — H&P (Signed)
Date: 03/26/2017               Patient Name:  [name] MRN: [id number]  DOB: [birth year] Age / Sex: 67 y.o., female   PCP: [name], MD         Medical Service: Internal Medicine Teaching Service         Attending Physician: Dr. [name], MD    First Contact: Dr. Vickki Muff Pager: [phone]  Second Contact: Dr. Lorella Nimrod Pager: [phone]       After Hours (After 5p/  First Contact Pager: [phone]  weekends / holidays): Second Contact Pager: [phone]   Chief Complaint: SOB, fatigue  History of Present Illness: This 68-year-old female with past medical history of paroxysmal A. 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  She also endorses PICA craving ice constantly.  She has not felt ill recently denies any cough, nasal congestion or cold.  She has not had any bloody diarrhea in fact has a difficult BM every 2-3 days and occasionally has some bright red blood she notices when wiping afterwards but not in stool, she does have internal hemorrhoids.   We admitted patient from the clinic where she was initially worked up that showed a Hgb of 7.1, with MCV of 81, troponin neg.  PT of 18.5 and INR of 1.56, EKG not suggestive of ischemia.     Meds:  No outpatient medications have been marked as taking for the 03/26/17 encounter [facility] Encounter).     Allergies: Allergies as of 03/26/2017 - Review Complete 03/26/2017  Allergen Reaction Noted  . Lorazepam Other (See Comments) 11/04/2010  . Morphine and related Other (See Comments) 06/27/2013  . Oxycontin [oxycodone] Other (See Comments) 05/03/2012  . Tramadol hcl Swelling    Past Medical History:  Diagnosis Date  . Adenomatous polyps 05/14/2011   Colonoscopy (05/2011): 4 mm adenomatous polyp  excised endoscopically Colonoscopy (02/2002): Adenomatous polyp excised endoscopically   . Allergic rhinitis 06/01/2012  . Anemia of chronic disease 01/01/2013  . Anxiety 07/24/2010  . Aortic atherosclerosis ([place]) 10/19/2014   Seen on CT scan, currently asymptomatic  . 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 Asymptomatic cholelithiasis 09/25/2015   Seen on CT scan 08/2015  . Bilateral cataracts 08/04/2012   Visually insignificant   . Carotid artery stenosis    s/p right endarterectomy (06/2010) Carotid US (07/2010):  Left: Moderate-to-severe (60-79%) calcific and non-calcific plaque origin and proximal ICA and ECA   . Chronic congestive heart failure with left ventricular diastolic dysfunction ([place]) 10/21/2010  . Chronic constipation 02/03/2011  . Chronic daily headache 01/16/2014  . Chronic low back pain 10/06/2012  . Chronic venous insufficiency 08/04/2012  . Clear cell renal cell carcinoma ([place]) 07/21/2011   s/p cryoablation of left RCC in 09/2011 by [name]. Followed by [name]  [facility]) .    Marland Kitchen COPD (chronic obstructive pulmonary disease) with emphysema (HCC)    PFTs 02/2012: FEV1 0.92 (40%), ratio 69, 27% increase in FEV1 with BD, TLC 91%, severe airtrapping, DLCO49% On chronic home O2. Pulmonary rehab referral 05/2012   . Depression 11/19/2005  . Esophageal stenosis 08/08/2015   Mild, benign-appearing on EGD 06/29/2015  . Fibromyalgia  08/29/2010  . Gastroesophageal reflux disease   . History of blood transfusion    "several times"   . Hyperlipidemia LDL goal < 100 11/20/2005  . Internal hemorrhoids 08/04/2012  . Lichen sclerosus of female genitalia 01/12/2017  . Mitral stenosis    s/p Mitral valve replacement with a 27-mm pericardial porcine valve (Medtronic Mosaic valve, serial #[id number] on 09/20/10, Dr. Prescott Gum)   . Moderate to severe pulmonary hypertension ([place])    2014 TEE w PA  peak pressure 46 mmHg, s/p MV replacement   . Moderately severe major depression ([place]) 11/19/2005  . Obesity (BMI 30.0-34.9) 10/23/2011  . Obstructive sleep apnea    Nocturnal polysomnography (06/2009): Moderate sleep apnea/ hypopnea syndrome , AHI 17.8 per hour with nonpositional hypopneas. CPAP titration to 12 CWP, AHI 2.4 per hour. On nocturnal CPAP via a small resMed Quattro full-face mask with heated humidifier.   . Osteoporosis    DEXA (12/09/2011): L-spine T -3.7, left hip T -1.4 DEXA (12/2004): L-spine T -2.6, left hip -0.1   . Paroxysmal atrial fibrillation ([place]) 10/22/2010   s/p Left atrial maze procedure for paroxysmal atrial fibrillation on 09/20/2010 by Dr Prescott Gum.  Subsequent splenic infarct, decision was made to re-anticoagulate with coumadin, likely life-long as this is the most likely cause of the splenic infarct.   . Pulmonary hypertension due to chronic obstructive pulmonary disease ([place]) 04/25/2016  . Right nephrolithiasis 09/06/2014   5 mm non-obstructing calculus seen on CT scan 09/05/2014   . Seborrheic keratosis 09/28/2015  . Severe obesity (BMI 35.0-39.9) with comorbidity ([place]) 10/23/2011  . Shortness of breath dyspnea   . Tobacco abuse 07/28/2012  . Type 2 diabetes mellitus with diabetic neuropathy (HCC)     Family History:  Family History  Problem Relation Age of Onset  . Peptic Ulcer Disease Father   . Heart attack Father 71       Died of MI at age 62  . Heart attack Brother 74       Died of MI at age 63  . Obesity Brother   . Pneumonia Mother   . Healthy Sister   . Lupus Daughter   . Obsessive Compulsive Disorder Daughter     Social History:  Social History   Socioeconomic History  . Marital status: Divorced    Spouse name: Not on file  . Number of children: Not on file  . Years of education: Not on file  . Highest education level: Not on file  Social Needs  . Financial resource strain: Not on file  . Food insecurity - worry: Not on file  . Food  insecurity - inability: Not on file  . Transportation needs - medical: Not on file  . Transportation needs - non-medical: Not on file  Occupational History  . Not on file  Tobacco Use  . Smoking status: Current Every Day Smoker    Packs/day: 1.00    Years: 53.00    Pack years: 53.00    Types: Cigarettes    Start date: 11/01/1962  . Smokeless tobacco: Never Used  . Tobacco comment: 1/2 to 1 pack per day  Substance and Sexual Activity  . Alcohol use: No    Alcohol/week: 0.0 oz  . Drug use: No  . Sexual activity: No    Birth control/protection: Post-menopausal  Other Topics Concern  . Not on file  Social History Narrative   Lives alone in [place] with 60 pound chow   Worked at [employer]  for 18 years   No car    Review of Systems: A complete ROS was negative except as per HPI.   Physical Exam: There were no vitals taken for this visit. Physical Exam  Constitutional: She is oriented to person, place, and time. No distress.  HENT:  Head: Normocephalic and atraumatic.  Eyes: Right eye exhibits no discharge. Left eye exhibits no discharge.  Neck: JVD: could not assess.  Cardiovascular: Normal rate and normal heart sounds. Exam reveals no gallop and no friction rub.  No murmur heard. Distant heart sounds  Pulmonary/Chest: Effort normal and breath sounds normal. No respiratory distress. She has no wheezes. She has no rales. She exhibits no tenderness.  Abdominal: Soft. Bowel sounds are normal. She exhibits no distension and no mass. There is no tenderness. There is no rebound and no guarding.  Musculoskeletal: She exhibits edema (2+UE and LE edema).  Neurological: She is alert and oriented to person, place, and time.  Skin: She is not diaphoretic.    EKG: personally reviewed my interpretation is sinus rhythm with 1st degree av block  CXR: personally reviewed my interpretation is none available  Assessment & Plan by Problem: Active Problems:    Symptomatic anemia  Symptomatic Anemia Dizziness, fatigue, PICA. Pt has had extensive anemia workup in the past, is borderline microcytic, has been very iron deficient in the past.  Likely with the AVM's and anticoagulation has had chronic slow bleeds.    -pt typed and screened, consented for blood products -will begin with 1 unit PRBC's goal hemoglobin >8 -will repeat Iron studies, very likely iron deficient -daily cbc  PAF Chronic, rate controlled, arrived in SR  -continue warfarin -continue metoprolol succinate 43m  COPD Chronic, severe FEV1 45% predicted  -continue Dulera, Spiriva daily -PRN albuterol nebulizer therapy Q6h  OSA  -continue cpap   Dispo: Admit patient to Observation with expected length of stay less than 2 midnights.  Signed: WKatherine Roan MD 03/26/2017, 7:48 PM  BVickki MuffMD PGY-1 Internal Medicine Pager # [phone]

## 2017-03-26 NOTE — Progress Notes (Signed)
CC: craving ice, exhausted, short of breath, weak, lightheaded  HPI:  [name] is a 67 y.o. with PMH paroxysmal afib ( on coumadin), mitral valve replacement, non obstructive coronary artery disease, anemia of chronic disease and chronic blood loss from gastric AVMs, pulmonary hypertension, tobacco use who presents for acute concern of craving ice, exhausted, short of breath, weak, lightheaded. Please see the assessment and plans for the status of the patient chronic medical problems.    Past Medical History:  Diagnosis Date  . Adenomatous polyps 05/14/2011   Colonoscopy (05/2011): 4 mm adenomatous polyp excised endoscopically Colonoscopy (02/2002): Adenomatous polyp excised endoscopically   . Allergic rhinitis 06/01/2012  . Anemia of chronic disease 01/01/2013  . Anxiety 07/24/2010  . Aortic atherosclerosis ([place]) 10/19/2014   Seen on CT scan, currently asymptomatic  . 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 Asymptomatic cholelithiasis 09/25/2015   Seen on CT scan 08/2015  . Bilateral cataracts 08/04/2012   Visually insignificant   . Carotid artery stenosis    s/p right endarterectomy (06/2010) Carotid US (07/2010):  Left: Moderate-to-severe (60-79%) calcific and non-calcific plaque origin and proximal ICA and ECA   . Chronic congestive heart failure with left ventricular diastolic dysfunction ([place]) 10/21/2010  . Chronic constipation 02/03/2011  . Chronic daily headache 01/16/2014  . Chronic low back pain 10/06/2012  . Chronic venous insufficiency 08/04/2012  . Clear cell renal cell carcinoma ([place]) 07/21/2011   s/p cryoablation of left RCC in 09/2011 by [name]. Followed by [name]  [facility]) .    Marland Kitchen COPD (chronic obstructive pulmonary disease) with emphysema (HCC)    PFTs 02/2012: FEV1 0.92 (40%), ratio 69, 27% increase in FEV1 with BD, TLC 91%, severe airtrapping, DLCO49% On  chronic home O2. Pulmonary rehab referral 05/2012   . Depression 11/19/2005  . Esophageal stenosis 08/08/2015   Mild, benign-appearing on EGD 06/29/2015  . Fibromyalgia 08/29/2010  . Gastroesophageal reflux disease   . History of blood transfusion    "several times"   . Hyperlipidemia LDL goal < 100 11/20/2005  . Internal hemorrhoids 08/04/2012  . Lichen sclerosus of female genitalia 01/12/2017  . Mitral stenosis    s/p Mitral valve replacement with a 27-mm pericardial porcine valve (Medtronic Mosaic valve, serial #[id number] on 09/20/10, Dr. Prescott Gum)   . Moderate to severe pulmonary hypertension ([place])    2014 TEE w PA peak pressure 46 mmHg, s/p MV replacement   . Moderately severe major depression ([place]) 11/19/2005  . Obesity (BMI 30.0-34.9) 10/23/2011  . Obstructive sleep apnea    Nocturnal polysomnography (06/2009): Moderate sleep apnea/ hypopnea syndrome , AHI 17.8 per hour with nonpositional hypopneas. CPAP titration to 12 CWP, AHI 2.4 per hour. On nocturnal CPAP via a small resMed Quattro full-face mask with heated humidifier.   . Osteoporosis    DEXA (12/09/2011): L-spine T -3.7, left hip T -1.4 DEXA (12/2004): L-spine T -2.6, left hip -0.1   . Paroxysmal atrial fibrillation ([place]) 10/22/2010   s/p Left atrial maze procedure for paroxysmal atrial fibrillation on 09/20/2010 by Dr Prescott Gum.  Subsequent splenic infarct, decision was made to re-anticoagulate with coumadin, likely life-long as this is the most likely cause of the splenic infarct.   . Pulmonary hypertension due to chronic obstructive pulmonary disease ([place]) 04/25/2016  . Right nephrolithiasis 09/06/2014   5 mm non-obstructing calculus seen on CT scan 09/05/2014   . Seborrheic keratosis 09/28/2015  . Severe  obesity (BMI 35.0-39.9) with comorbidity ([place]) 10/23/2011  . Shortness of breath dyspnea   . Tobacco abuse 07/28/2012  . Type 2 diabetes mellitus with diabetic neuropathy ([place])    Review of Systems: Refer to history of present  illness and assessment and plans for pertinent review of systems, all others reviewed and negative  Physical Exam:  Vitals:   03/26/17 1453 03/26/17 1454  BP: (!) 142/56   Pulse: 84   Temp: 97.8 F (36.6 C)   TempSrc: Oral   SpO2: 94% 94%  Weight: 202 lb 11.2 oz (91.9 kg)   Height: _0  (1.575 m)    General: no acute distress, tired appearing  Cardiac: RRR, no murmur appreciated, JVD not appreciated, 1+ bilateral lower extremity pitting edema  Pulm: pursed lips breathing, accessory muscle use, distant lung sounds but clear to auscultation in the upper lung fields, no wheezing HEENT: pale conjunctiva  Skin: capillary refill at 4 seconds  Abdomen: distended and diffusely tender to palpation, bruising over the right and left lower quadrants when she injects insulin  Psych: conversational, friendly, laughing nervously at times   Assessment & Plan:   Symptomatic anemia Describes PICA which began 3 weeks ago and has become associated with exhaustion, shortness of breath, generalized weakness and lightheadedness. She denies melena, diarrhea, hematemesis, hemoptysis, melena, or vaginal bleeding. She is on chronic anticoagulant for paroxysmal atrial fibrillation for high risk of stroke.  - considered orthostatic vitals however she feels too unwell to attempt these  - EKG with improvement in T wave flattening when compared to prior, no acute ST changes evident, q waves in aVR and V1 unchanged from prior   - Stat Troponin I <0.03  - Admit to observation for blood transfusion for symptomatic anemia   Volume overload  Symptoms of volume overload such as shortness of breath and pleuritic chest pain. She has peripheral edema and abdominal distention on exam. She has missed multiple doses of lasix, has taken it twice in the past week, etiology is most likely med non compliance. Says that it is difficult to take this because she has to wear adult briefs and pads to avoid soiling herself because she  cannot make it to the restroom in time. Briefs are complicated by candida and lichen sclerosis which is very uncomfortable for her.  - consider need for diuresis during admission as she will need blood transfusion and may have worsening of the volume overload    See Encounters Tab for problem based charting.  Patient discussed with Dr. Evette Doffing

## 2017-03-26 NOTE — Telephone Encounter (Signed)
Pt states her sister is on her way to pick her up, "we will be there soon", added to [facility] schedule for 1415 Per dr Eppie Gibson

## 2017-03-26 NOTE — Telephone Encounter (Signed)
I called [name].  She has considerable PICA reminiscent of previous clinically significant anemia.  She is also more short of breath than usual.  She is going to try to arrange transportation to the clinic with her sister.  I will ask [name] to call with an [facility] appointment this afternoon (we have plenty of openings).

## 2017-03-27 ENCOUNTER — Encounter (HOSPITAL_COMMUNITY): Payer: Self-pay | Admitting: General Practice

## 2017-03-27 DIAGNOSIS — K219 Gastro-esophageal reflux disease without esophagitis: Secondary | ICD-10-CM | POA: Diagnosis present

## 2017-03-27 DIAGNOSIS — Z85528 Personal history of other malignant neoplasm of kidney: Secondary | ICD-10-CM | POA: Diagnosis not present

## 2017-03-27 DIAGNOSIS — Z7901 Long term (current) use of anticoagulants: Secondary | ICD-10-CM

## 2017-03-27 DIAGNOSIS — Z9114 Patient's other noncompliance with medication regimen: Secondary | ICD-10-CM

## 2017-03-27 DIAGNOSIS — Z9889 Other specified postprocedural states: Secondary | ICD-10-CM | POA: Diagnosis not present

## 2017-03-27 DIAGNOSIS — I480 Paroxysmal atrial fibrillation: Secondary | ICD-10-CM | POA: Diagnosis not present

## 2017-03-27 DIAGNOSIS — Z952 Presence of prosthetic heart valve: Secondary | ICD-10-CM | POA: Diagnosis not present

## 2017-03-27 DIAGNOSIS — F17200 Nicotine dependence, unspecified, uncomplicated: Secondary | ICD-10-CM | POA: Diagnosis not present

## 2017-03-27 DIAGNOSIS — Z79899 Other long term (current) drug therapy: Secondary | ICD-10-CM

## 2017-03-27 DIAGNOSIS — E669 Obesity, unspecified: Secondary | ICD-10-CM

## 2017-03-27 DIAGNOSIS — J449 Chronic obstructive pulmonary disease, unspecified: Secondary | ICD-10-CM | POA: Diagnosis not present

## 2017-03-27 DIAGNOSIS — Z8679 Personal history of other diseases of the circulatory system: Secondary | ICD-10-CM

## 2017-03-27 DIAGNOSIS — Z794 Long term (current) use of insulin: Secondary | ICD-10-CM | POA: Diagnosis not present

## 2017-03-27 DIAGNOSIS — K5521 Angiodysplasia of colon with hemorrhage: Secondary | ICD-10-CM | POA: Diagnosis not present

## 2017-03-27 DIAGNOSIS — G4733 Obstructive sleep apnea (adult) (pediatric): Secondary | ICD-10-CM | POA: Diagnosis not present

## 2017-03-27 DIAGNOSIS — R4702 Dysphasia: Secondary | ICD-10-CM

## 2017-03-27 DIAGNOSIS — E119 Type 2 diabetes mellitus without complications: Secondary | ICD-10-CM | POA: Diagnosis not present

## 2017-03-27 DIAGNOSIS — M797 Fibromyalgia: Secondary | ICD-10-CM | POA: Diagnosis not present

## 2017-03-27 DIAGNOSIS — Z8249 Family history of ischemic heart disease and other diseases of the circulatory system: Secondary | ICD-10-CM | POA: Diagnosis not present

## 2017-03-27 DIAGNOSIS — E785 Hyperlipidemia, unspecified: Secondary | ICD-10-CM | POA: Diagnosis not present

## 2017-03-27 DIAGNOSIS — Z9989 Dependence on other enabling machines and devices: Secondary | ICD-10-CM

## 2017-03-27 DIAGNOSIS — E1140 Type 2 diabetes mellitus with diabetic neuropathy, unspecified: Secondary | ICD-10-CM | POA: Diagnosis not present

## 2017-03-27 DIAGNOSIS — Z9981 Dependence on supplemental oxygen: Secondary | ICD-10-CM | POA: Diagnosis not present

## 2017-03-27 DIAGNOSIS — M810 Age-related osteoporosis without current pathological fracture: Secondary | ICD-10-CM | POA: Diagnosis present

## 2017-03-27 DIAGNOSIS — R32 Unspecified urinary incontinence: Secondary | ICD-10-CM | POA: Diagnosis present

## 2017-03-27 DIAGNOSIS — R5383 Other fatigue: Secondary | ICD-10-CM | POA: Diagnosis present

## 2017-03-27 DIAGNOSIS — F17210 Nicotine dependence, cigarettes, uncomplicated: Secondary | ICD-10-CM | POA: Diagnosis present

## 2017-03-27 DIAGNOSIS — I5032 Chronic diastolic (congestive) heart failure: Secondary | ICD-10-CM

## 2017-03-27 DIAGNOSIS — Z7951 Long term (current) use of inhaled steroids: Secondary | ICD-10-CM

## 2017-03-27 DIAGNOSIS — D500 Iron deficiency anemia secondary to blood loss (chronic): Principal | ICD-10-CM

## 2017-03-27 DIAGNOSIS — Z85520 Personal history of malignant carcinoid tumor of kidney: Secondary | ICD-10-CM

## 2017-03-27 DIAGNOSIS — I2510 Atherosclerotic heart disease of native coronary artery without angina pectoris: Secondary | ICD-10-CM | POA: Diagnosis not present

## 2017-03-27 LAB — CBC
HCT: 25.9 % — ABNORMAL LOW (ref 36.0–46.0)
Hemoglobin: 7.5 g/dL — ABNORMAL LOW (ref 12.0–15.0)
MCH: 23.7 pg — ABNORMAL LOW (ref 26.0–34.0)
MCHC: 29 g/dL — ABNORMAL LOW (ref 30.0–36.0)
MCV: 81.7 fL (ref 78.0–100.0)
Platelets: 217 10*3/uL (ref 150–400)
RBC: 3.17 MIL/uL — ABNORMAL LOW (ref 3.87–5.11)
RDW: 18.9 % — ABNORMAL HIGH (ref 11.5–15.5)
WBC: 7.2 10*3/uL (ref 4.0–10.5)

## 2017-03-27 LAB — GLUCOSE, CAPILLARY
Glucose-Capillary: 212 mg/dL — ABNORMAL HIGH (ref 65–99)
Glucose-Capillary: 201 mg/dL — ABNORMAL HIGH (ref 65–99)
Glucose-Capillary: 241 mg/dL — ABNORMAL HIGH (ref 65–99)
Glucose-Capillary: 183 mg/dL — ABNORMAL HIGH (ref 65–99)
Glucose-Capillary: 242 mg/dL — ABNORMAL HIGH (ref 65–99)

## 2017-03-27 LAB — URINALYSIS, ROUTINE W REFLEX MICROSCOPIC
Bacteria, UA: NONE SEEN
Bilirubin Urine: NEGATIVE
Glucose, UA: NEGATIVE mg/dL
Hgb urine dipstick: NEGATIVE
Ketones, ur: NEGATIVE mg/dL
Nitrite: NEGATIVE
Protein, ur: NEGATIVE mg/dL
Specific Gravity, Urine: 1.011 (ref 1.005–1.030)
pH: 7 (ref 5.0–8.0)

## 2017-03-27 LAB — BASIC METABOLIC PANEL
Anion gap: 11 (ref 5–15)
BUN: 13 mg/dL (ref 6–20)
CO2: 23 mmol/L (ref 22–32)
Calcium: 8.8 mg/dL — ABNORMAL LOW (ref 8.9–10.3)
Chloride: 102 mmol/L (ref 101–111)
Creatinine, Ser: 0.94 mg/dL (ref 0.44–1.00)
GFR calc Af Amer: >60 mL/min (ref >60)
GFR calc non Af Amer: >60 mL/min (ref >60)
Glucose, Bld: 194 mg/dL — ABNORMAL HIGH (ref 65–99)
Potassium: 4.5 mmol/L (ref 3.5–5.1)
Sodium: 136 mmol/L (ref 135–145)

## 2017-03-27 LAB — PROTIME-INR
INR: 1.6
Prothrombin Time: 18.9 seconds — ABNORMAL HIGH (ref 11.4–15.2)

## 2017-03-27 LAB — PREPARE RBC (CROSSMATCH)

## 2017-03-27 LAB — HEMOGLOBIN AND HEMATOCRIT, BLOOD
HCT: 30 % — ABNORMAL LOW (ref 36.0–46.0)
Hemoglobin: 8.9 g/dL — ABNORMAL LOW (ref 12.0–15.0)

## 2017-03-27 MED ORDER — ALBUTEROL SULFATE (2.5 MG/3ML) 0.083% IN NEBU
3.0000 mL | INHALATION_SOLUTION | Freq: Four times a day (QID) | RESPIRATORY_TRACT | Status: DC
  Administered 2017-03-27 (×2): 3 mL via RESPIRATORY_TRACT
  Filled 2017-03-27 (×2): qty 3

## 2017-03-27 MED ORDER — INSULIN ASPART 100 UNIT/ML ~~LOC~~ SOLN
0.0000 [IU] | Freq: Every day | SUBCUTANEOUS | Status: DC
  Administered 2017-03-27: 2 [IU] via SUBCUTANEOUS

## 2017-03-27 MED ORDER — NYSTATIN 100000 UNIT/GM EX CREA
1.0000 "application " | TOPICAL_CREAM | Freq: Two times a day (BID) | CUTANEOUS | Status: DC
  Administered 2017-03-27 – 2017-03-28 (×2): 1 via TOPICAL
  Filled 2017-03-27: qty 15

## 2017-03-27 MED ORDER — ALBUTEROL SULFATE (2.5 MG/3ML) 0.083% IN NEBU
3.0000 mL | INHALATION_SOLUTION | Freq: Two times a day (BID) | RESPIRATORY_TRACT | Status: DC
  Administered 2017-03-28: 3 mL via RESPIRATORY_TRACT
  Filled 2017-03-27: qty 3

## 2017-03-27 MED ORDER — SODIUM CHLORIDE 0.9 % IV SOLN
510.0000 mg | Freq: Once | INTRAVENOUS | Status: AC
  Administered 2017-03-27: 510 mg via INTRAVENOUS
  Filled 2017-03-27: qty 17

## 2017-03-27 MED ORDER — INSULIN DETEMIR 100 UNIT/ML ~~LOC~~ SOLN: 10.0000 [IU] | Freq: Every day | SUBCUTANEOUS | Status: DC

## 2017-03-27 MED ORDER — SODIUM CHLORIDE 0.9 % IV SOLN
Freq: Once | INTRAVENOUS | Status: AC
  Administered 2017-03-27: 250 mL via INTRAVENOUS

## 2017-03-27 MED ORDER — ALPRAZOLAM 0.5 MG PO TABS
1.0000 mg | ORAL_TABLET | Freq: Two times a day (BID) | ORAL | Status: DC | PRN
  Administered 2017-03-27: 1 mg via ORAL
  Filled 2017-03-27: qty 2

## 2017-03-27 MED ORDER — INSULIN ASPART 100 UNIT/ML ~~LOC~~ SOLN
0.0000 [IU] | Freq: Three times a day (TID) | SUBCUTANEOUS | Status: DC
  Administered 2017-03-27 (×2): 5 [IU] via SUBCUTANEOUS
  Administered 2017-03-28: 3 [IU] via SUBCUTANEOUS

## 2017-03-27 MED ORDER — INSULIN ASPART 100 UNIT/ML ~~LOC~~ SOLN
5.0000 [IU] | Freq: Three times a day (TID) | SUBCUTANEOUS | Status: DC
  Administered 2017-03-27 – 2017-03-28 (×3): 5 [IU] via SUBCUTANEOUS

## 2017-03-27 MED ORDER — INSULIN ASPART 100 UNIT/ML ~~LOC~~ SOLN
3.0000 [IU] | Freq: Three times a day (TID) | SUBCUTANEOUS | Status: DC
  Administered 2017-03-27: 3 [IU] via SUBCUTANEOUS

## 2017-03-27 MED ORDER — INSULIN DETEMIR 100 UNIT/ML ~~LOC~~ SOLN
30.0000 [IU] | Freq: Every day | SUBCUTANEOUS | Status: DC
  Administered 2017-03-27: 30 [IU] via SUBCUTANEOUS
  Filled 2017-03-27 (×2): qty 0.3

## 2017-03-27 NOTE — Progress Notes (Signed)
Internal Medicine Clinic Attending  Case discussed with Dr. Blum at the time of the visit.  We reviewed the resident's history and exam and pertinent patient test results.  I agree with the assessment, diagnosis, and plan of care documented in the resident's note. 

## 2017-03-27 NOTE — Progress Notes (Signed)
Pt requested to use bathroom twice but was not able to.  Bladder scan showed 541.  MD notified and an order for in-and-out catheterization received.  Will continue to monitor.

## 2017-03-27 NOTE — Progress Notes (Signed)
Internal Medicine Attending  Date: 03/27/2017  Patient name: [name] Medical record number: [id number] Date of birth: [birth year] Age: 67 y.o. Gender: female  I saw and evaluated the patient. I reviewed the resident's note by Dr. Shan Levans and I agree with the resident's findings and plans as documented in his progress note.  Please see my H&P dated 03/27/2017 for the specifics of my evaluation, assessment, and plan from earlier in the day.

## 2017-03-27 NOTE — Addendum Note (Signed)
Addended by: Lalla Brothers T on: 03/27/2017 11:47 AM   Modules accepted: Level of Service

## 2017-03-27 NOTE — Progress Notes (Signed)
Subjective:  [name] SOB has improved mildly after her breathing treatment this morning. She thinks her fatigue has improved slightly but is still feeling lethargic and slightly lightheaded. Had trouble sleeping because her CPAP was not set up for her to use last night. She was unable to urinate last night and received an I/O cath which drained 600 mL. No other acute complaints.  Objective: Vital signs in last 24 hours: Vitals:   03/26/17 2314 03/27/17 0248 03/27/17 0520 03/27/17 0918  BP: (!) 130/45 (!) 104/46 (!) 158/52   Pulse: 65 70 69   Resp: _0 Temp: 98.3 F (36.8 C) 98.6 F (37 C) 98.5 F (36.9 C)   TempSrc: Oral Oral Oral   SpO2: 94% 100% 97% 99%   Weight change:   Intake/Output Summary (Last 24 hours) at 03/27/2017 1050 Last data filed at 03/27/2017 1047 Gross per 24 hour  Intake 374 ml  Output 1200 ml  Net -826 ml   Physical Exam: General: A+O x 3, NAD, sitting comfortably HEENT: Normocephalic, atraumatic, eyes without conjunctival injection, scleral icterus, or drainage CV: Normal rate and rhythm. nl S1S2. Distant heart sounds. No m/r/g. Unable to assess JVD due to body habitus.  Pulm: Normal respiratory effort, no increased WOB, CTAB, distant lung sounds Abd: soft, BS+, no ttp across all 4 quadrants MSK: 2+ pitting edema in upper and lower extremities   Lab Results:  BMET    Component Value Date/Time   NA 136 03/27/2017 0636   NA 138 04/29/2016   K 4.5 03/27/2017 0636   CL 102 03/27/2017 0636   CO2 23 03/27/2017 0636   GLUCOSE 194 (H) 03/27/2017 0636   BUN 13 03/27/2017 0636   BUN 30 (A) 04/29/2016   CREATININE 0.94 03/27/2017 0636   CREATININE 0.91 08/21/2014 1027   CALCIUM 8.8 (L) 03/27/2017 0636   GFRNONAA >60 03/27/2017 0636   GFRNONAA 66 08/21/2014 1027   GFRAA >60 03/27/2017 0636   GFRAA 77 08/21/2014 1027   CBC    Component Value Date/Time   WBC 7.2 03/27/2017 0636   RBC 3.17 (L) 03/27/2017 0636   HGB 7.5 (L) 03/27/2017  0636   HGB 9.7 (L) 01/12/2017 1212   HGB 12.4 01/26/2012 0851   HCT 25.9 (L) 03/27/2017 0636   HCT 31.2 (L) 01/12/2017 1212   HCT 36.5 01/26/2012 0851   PLT 217 03/27/2017 0636   PLT 216 01/12/2017 1212   MCV 81.7 03/27/2017 0636   MCV 84 01/12/2017 1212   MCV 94.5 01/26/2012 0851   MCH 23.7 (L) 03/27/2017 0636   MCHC 29.0 (L) 03/27/2017 0636   RDW 18.9 (H) 03/27/2017 0636   RDW 17.2 (H) 01/12/2017 1212   RDW 16.0 (H) 01/26/2012 0851   LYMPHSABS 1.2 07/17/2016 1054   LYMPHSABS 1.8 01/26/2012 0851   MONOABS 1.2 (H) 06/27/2015 1139   MONOABS 0.9 01/26/2012 0851   EOSABS 0.1 07/17/2016 1054   BASOSABS 0.0 07/17/2016 1054   BASOSABS 0.0 01/26/2012 0851   Urinalysis    Component Value Date/Time   COLORURINE YELLOW 03/27/2017 0231   APPEARANCEUR HAZY (A) 03/27/2017 0231   LABSPEC 1.011 03/27/2017 0231   PHURINE 7.0 03/27/2017 0231   GLUCOSEU NEGATIVE 03/27/2017 0231   GLUCOSEU 100 (A) 03/08/2010 2019   HGBUR NEGATIVE 03/27/2017 0231   HGBUR small 11/15/2008 0915   BILIRUBINUR NEGATIVE 03/27/2017 0231   BILIRUBINUR Negaive 05/26/2016 1640   KETONESUR NEGATIVE 03/27/2017 0231   PROTEINUR NEGATIVE 03/27/2017 0231   UROBILINOGEN  1.0 05/26/2016 1640   UROBILINOGEN 0.2 10/25/2014 1142   NITRITE NEGATIVE 03/27/2017 0231   LEUKOCYTESUR TRACE (A) 03/27/2017 0231   Prothrombin Time- 18.9 INR- 1.60   Micro Results: No results found for this or any previous visit (from the past 240 hour(s)). Studies/Results: No results found. Medications: I have reviewed the patient's current medications. Scheduled Meds: . buPROPion  75 mg Oral BID  . clobetasol cream  1 application Topical BID  . DULoxetine  60 mg Oral BID  . ferrous gluconate  324 mg Oral BID WC  . furosemide  40 mg Oral Daily  . gabapentin  600 mg Oral TID  . insulin aspart  0-15 Units Subcutaneous TID WC  . insulin aspart  0-5 Units Subcutaneous QHS  . insulin aspart  3 Units Subcutaneous TID WC  . insulin detemir  30  Units Subcutaneous QHS  . metoprolol succinate  25 mg Oral Daily  . mometasone-formoterol  2 puff Inhalation BID  . nicotine  14 mg Transdermal Daily  . pantoprazole  40 mg Oral Daily  . polyethylene glycol  17 g Oral Daily  . rosuvastatin  20 mg Oral QHS  . tiotropium  18 mcg Inhalation Daily  . warfarin  2.5 mg Oral q1800  . Warfarin - Physician Dosing Inpatient   Does not apply q1800   Continuous Infusions: PRN Meds:.acetaminophen **OR** acetaminophen, albuterol, ALPRAZolam, HYDROcodone-acetaminophen Assessment/Plan: Active Problems:   Symptomatic anemia  [name] is a 67 yo female with a past medical history of paroxysmal Afib on warfarin, mitral valve replacement, AVMs in GI tract, nonobstructive CAD, severe PAH, tobacco use disorder, COPD, and clear cell renal cancer who presents with a 3 week history of worsening fatigue, lightheadedness, SOB with exertion, and PICA. Due to ongoing symptoms of dizziness, fatigue, and PICA with Hg of 7.2 on admission, decreased iron/ferritin, borderline low MCV, and history of AVMs and Afib with Coumadin anticoagulation, pt most likely presents with symptomatic anemia due to chronic slow bleeding.   Symptomatic anemia: Pt has had extensive anemia workup in the past. On presentation, Hg- 7.2 with MCV- 81.0. Iron- 13, Ferritin- 5, TIBC- 462. On 2/15 after 1 unit PRBC overnight: Hg- 7.5, Hct- 25.9, MCV- 81.7. Pt remains fatigued this morning but says her symptoms have improved slightly. - Will repeat 1 unit PRBCs and assess CBC post-transfusion, if Hgb < 8 and pt remains symptomatic, another unit of PRBC transfusion would be indicated - IV Feraheme 510 mg to replete iron  Acute Urinary Retention: Pt was unable to void last night and had an I/O cath placed which drained 600 mL of urine. This is an entirely new symptom for her, as she usually experiences urinary incontinence when she takes lasix. Etiology uncertain. - Continue to monitor urinary output  today - If another I/O cath is needed and pt is unable to void spontaneously, consider workup of acute urinary retention with [place] to visualize for potential obstruction.  PAF: Chronic, rate controlled, in sinus rhythm - Continue warfarin 2.5 mg PO qd  - Continue metoprolol succinate 68m  COPD Review of PFTs from 04/22/16 suggests component of restrictive lung disease pattern with FEV1- 45% pred, FVC- 46% pred, and FEV1/FVC ratio- 75. - Continue Dulera, Spiriva daily - PRN albuterol nebulizer therapy Q6h - Consider repeat outpt PFTs and investigation of potential restrictive etiology  OSA: chronic - continue CPAP   This is a MCareers information officerNote.  The care of the patient was discussed with Dr. WShan Levansand the  assessment and plan formulated with their assistance.  Please see their attached note for official documentation of the daily encounter.   LOS: 0 days   Isidore Moos, Medical Student 03/27/2017, 10:50 AM

## 2017-03-27 NOTE — Progress Notes (Signed)
PHARMACIST - PHYSICIAN COMMUNICATION DR:  Eppie Gibson CONCERNING: Pharmacy Care Issues Regarding Warfarin Labs  RECOMMENDATION (Action Taken): A daily protime for three days has been ordered to meet the [facility] National Patient safety goal and comply with the current [place].   The Pharmacy will defer all warfarin dose order changes and follow up of lab results to the prescriber unless an additional order to initiate a "pharmacy Coumadin consult" is placed.  DESCRIPTION:  While hospitalized, to be in compliance with [facility] Patient Safety Goals, all patients on warfarin must have a baseline and/or current protime prior to the administration of warfarin. Pharmacy has received your order for warfarin without these required laboratory assessments.   Renold Genta, PharmD, BCPS Clinical Pharmacist Phone for today - [place] - [phone] 03/27/2017 1:50 PM

## 2017-03-27 NOTE — H&P (Signed)
Internal Medicine Attending Admission Note Date: 03/27/2017  Patient name: [name] Medical record number: [id number] Date of birth: [birth year] Age: 67 y.o. Gender: female  I saw and evaluated the patient. I reviewed the resident's note and I agree with the resident's findings and plan as documented in the resident's note.  Chief Complaint(s): Pica, shortness of breath above baseline, and generalized fatigue 3 weeks.  History - key components related to admission:  [name] is a 67 year old woman with a history of mitral valve stenosis status post mitral valve replacement, paroxysmal atrial fibrillation, gastrointestinal AVMs, chronic iron deficiency anemia secondary to GI blood loss, chronic diastolic heart failure, obstructive sleep apnea, obesity, tobacco abuse, diabetes, and history of renal cell carcinoma who presents with a three-week history of shortness of breath above baseline, generalized fatigue, and Pica. She called the clinic with these symptoms and they were reminiscent of prior episodes of severe symptomatic anemia. She was therefore asked to come to the [facility] to be evaluated. There, she was found to have a hemoglobin of 7.1, which was felt to signify she had symptomatic anemia likely secondary to chronic GI blood loss from her known AVMs and needed warfarin therapy. She was therefore admitted to the internal medicine teaching service for a blood transfusion and further assessment. She admitted to being noncompliant with her ferrous gluconate. She denied any melenic stools or bright red blood per rectum.  Physical Exam - key components related to admission:  Vitals:   03/27/17 1049 03/27/17 1116 03/27/17 1322 03/27/17 1348  BP: (!) 136/41 (!) 139/98 (!) 136/99   Pulse: 69 71 60   Resp: _0 Temp: 98.1 F (36.7 C) 98 F (36.7 C) 98.5 F (36.9 C)   TempSrc: Oral Oral Oral   SpO2: 95% 95% 96% 93%   Gen.: Well-developed, well-nourished, obese  woman sitting comfortably on a bedside commode in no acute distress. She was more pale than usual. Lungs: Distant breath sounds with occasional end expiratory wheezes. There were no crackles or rales. Heart: Regular rate and rhythm. Abdomen: Soft, nontender.  Lab results:  Basic Metabolic Panel: Recent Labs    03/26/17 1832 03/27/17 0636  NA 138 136  K 4.5 4.5  CL 102 102  CO2 26 23  GLUCOSE 144* 194*  BUN 12 13  CREATININE 0.92 0.94  CALCIUM 9.2 8.8*   Liver Function Tests: Recent Labs    03/26/17 1832  AST 18  ALT 16  ALKPHOS 62  BILITOT 0.4  PROT 6.5  ALBUMIN 3.4*   CBC: Recent Labs    03/26/17 1521 03/27/17 0636  WBC 9.8 7.2  HGB 7.2* 7.5*  HCT 25.1* 25.9*  MCV 81.0 81.7  PLT 229 217   Cardiac Enzymes: Recent Labs    03/26/17 1521  TROPONINI <0.03   CBG: Recent Labs    03/26/17 1526 03/27/17 1250  GLUCAP 173* 212*   Anemia Panel: Recent Labs    03/26/17 2124  FERRITIN 5*  TIBC 462*  IRON 13*   Coagulation: Recent Labs    03/26/17 1521 03/27/17 0636  INR 1.56 1.60   Urinalysis:  Hazy, yellow, specific gravity 1.011, pH 7.0, negative nitrite, trace leukocytes, 0-5 red blood cells per high-power field, 6-30 white blood cells per high-power field.  Other results:  EKG: Personally reviewed. Normal sinus rhythm at 75 bpm, normal axis, first-degree AV block, no significant Q waves, no LVH by voltage, poor R wave progression, no ST or T-wave changes.  This ECG is unchanged from the previous ECG on 04/18/2016.  Assessment & Plan by Problem:  [name] is a 67 year old woman with a history of mitral valve stenosis status post mitral valve replacement, paroxysmal atrial fibrillation, gastrointestinal AVMs, chronic iron deficiency anemia secondary to GI blood loss, chronic diastolic heart failure, obstructive sleep apnea, obesity, tobacco abuse, diabetes, and history of renal cell carcinoma who presents with a three-week history of shortness  of breath above baseline, generalized fatigue, and Pica. She was found to be anemic and felt to have symptomatic anemia from chronic GI blood losses via her GI AVMs in the setting of required warfarin therapy. Complicating things is the fact that she has not been compliant with her ferrous gluconate.  1) Symptomatic anemia from chronic GI blood loss: She had very little response in her hemoglobin to the first unit of packed red blood cells. We are there for providing her with an additional unit today. We are also providing her with IV iron given her noncompliance with her oral iron therapy. On rounds we stressed the importance of compliance with her ferrous gluconate given her known chronic GI blood losses and the need for continuous warfarin therapy.  2) Dysphasia: While she is an inpatient we will get a barium swallow which had been planned as an outpatient to assess her dysphasia. This is important given her symptomatic anemia from presumed GI blood losses.  3) Disposition: If she does well with the blood transfusion and IV iron she will be discharged home tomorrow to follow-up in the [place] with her primary care provider.

## 2017-03-27 NOTE — Progress Notes (Signed)
   Subjective: Patient reports mild improvement in symptoms today.  She was having some trouble urinating overnight had to receive an in and out cath.  However during the day she urinated well on her own.    Objective:  Vital signs in last 24 hours: Vitals:   03/27/17 1116 03/27/17 1322 03/27/17 1348 03/27/17 1435  BP: (!) 139/98 (!) 136/99  (!) 139/48  Pulse: 71 60  65  Resp: _0 Temp: 98 F (36.7 C) 98.5 F (36.9 C)  98.4 F (36.9 C)  TempSrc: Oral Oral  Oral  SpO2: 95% 96% 93% 94%   Physical Exam  Constitutional: She is oriented to person, place, and time. No distress.  HENT:  Head: Normocephalic and atraumatic.  Eyes: Right eye exhibits no discharge. Left eye exhibits no discharge.  Neck: JVD: could not assess.  Cardiovascular: Normal rate and normal heart sounds. Exam reveals no gallop and no friction rub.  No murmur heard. Distant heart sounds  Pulmonary/Chest: Effort normal and breath sounds normal. No respiratory distress. She has no wheezes. She has no rales. She exhibits no tenderness.  Abdominal: Soft. Bowel sounds are normal. She exhibits no distension and no mass. There is no tenderness. There is no rebound and no guarding.  Musculoskeletal: She exhibits edema (2+UE and LE edema).  Neurological: She is alert and oriented to person, place, and time.  Skin: She is not diaphoretic.    CBC Latest Ref Rng & Units 03/27/2017 03/27/2017 03/26/2017  WBC 4.0 - 10.5 K/uL - 7.2 9.8  Hemoglobin 12.0 - 15.0 g/dL 8.9(L) 7.5(L) 7.2(L)  Hematocrit 36.0 - 46.0 % 30.0(L) 25.9(L) 25.1(L)  Platelets 150 - 400 K/uL - 217 229   Assessment/Plan:  Active Problems:   Symptomatic anemia   AVM (arteriovenous malformation)   Pica in adults  Symptomatic Anemia Hgb 7.2 on admission, Dizziness, fatigue, PICA. Pt has had extensive anemia workup in the past, is borderline microcytic, has been very iron deficient in the past.  Likely with the AVM's and anticoagulation has had chronic  slow bleeds.  Mentions she has seen a hematologist in the past who was considering adding epo injections but too dangerous with renal cell cancer.     -pt typed and screened, consented for blood products -ferritin <5, iron 22, increased TIBC -given 2 units with post transfusion Hgb of 8.9 -daily cbc  PAF Chronic, rate controlled, arrived in [place]   -continue warfarin -continue metoprolol succinate 6m   COPD PFTs suggest mixed restrictive and obstructive pattern, FEV1 45% predicted, FEV1/FVC ratio 75   -continue Dulera, Spiriva daily -albuterol nebulizer therapy Q6h   OSA   -continue cpap   T2DM A1C 7.2 in October  -SSI + mealtime insulin 5 units for now  Dysphagia Chronic issue, outpatient barium swallow ordered in December  -will order while pt here in hospital   Dispo: Anticipated discharge in approximately 1 day(s).   WKatherine Roan MD 03/27/2017, 3:26 PM BVickki MuffMD PGY-1 Internal Medicine Pager # [phone]

## 2017-03-28 ENCOUNTER — Inpatient Hospital Stay (HOSPITAL_COMMUNITY): Payer: PPO

## 2017-03-28 LAB — CBC
HCT: 30.2 % — ABNORMAL LOW (ref 36.0–46.0)
Hemoglobin: 8.8 g/dL — ABNORMAL LOW (ref 12.0–15.0)
MCH: 23.8 pg — ABNORMAL LOW (ref 26.0–34.0)
MCHC: 29.1 g/dL — ABNORMAL LOW (ref 30.0–36.0)
MCV: 81.6 fL (ref 78.0–100.0)
Platelets: 224 10*3/uL (ref 150–400)
RBC: 3.7 MIL/uL — ABNORMAL LOW (ref 3.87–5.11)
RDW: 18.2 % — ABNORMAL HIGH (ref 11.5–15.5)
WBC: 7.2 10*3/uL (ref 4.0–10.5)

## 2017-03-28 LAB — GLUCOSE, CAPILLARY
Glucose-Capillary: 154 mg/dL — ABNORMAL HIGH (ref 65–99)
Glucose-Capillary: 113 mg/dL — ABNORMAL HIGH (ref 65–99)

## 2017-03-28 LAB — PROTIME-INR
INR: 1.45
Prothrombin Time: 17.5 seconds — ABNORMAL HIGH (ref 11.4–15.2)

## 2017-03-28 MED ORDER — FUROSEMIDE 80 MG PO TABS: 80.0000 mg | ORAL_TABLET | Freq: Every day | ORAL | Status: DC

## 2017-03-28 NOTE — Progress Notes (Signed)
Harlon Flor Cothron to be D/C'd Home per MD order.  Discussed with the patient and all questions fully answered.  VSS, Skin clean, dry and intact without evidence of skin break down, no evidence of skin tears noted. IV catheter discontinued intact. Site without signs and symptoms of complications. Dressing and pressure applied.  An After Visit Summary was printed and given to the patient. Patient received prescription.  D/c education completed with patient/family including follow up instructions, medication list, d/c activities limitations if indicated, with other d/c instructions as indicated by MD - patient able to verbalize understanding, all questions fully answered.   Patient instructed to return to ED, call 911, or call MD for any changes in condition.   Patient escorted via [place], and D/C home via private auto.  Luci Bank 03/28/2017 2:42 PM

## 2017-03-28 NOTE — Progress Notes (Signed)
  Date: 03/28/2017  Patient name: [name]  Medical record number: [id number]  Date of birth: 03/20/1950   This patient's plan of care was discussed with the house staff. Please see Dr.  Latina Craver note for complete details. I concur with her findings.  Plan for discharge with good follow up in the clinic today.    Sid Falcon, MD 03/28/2017, 11:25 AM

## 2017-03-28 NOTE — Progress Notes (Signed)
Subjective: Patient was feeling better when seen this morning.  She is able to void without any difficulty.  Denies any dyspnea.  Objective:  Vital signs in last 24 hours: Vitals:   03/27/17 2104 03/28/17 0434 03/28/17 0849 03/28/17 0856  BP: 138/64 (!) 156/68  (!) 154/61  Pulse: 67 70  60  Resp: 18 18    Temp: 97.8 F (36.6 C) 97.7 F (36.5 C)    TempSrc: Oral Oral    SpO2: 97% 96% 96%    General.  Well-developed, well-nourished lady, in no acute distress. Lungs.  Clear bilaterally, little distant breath sounds due to body habitus. CV.  Regular rate and rhythm. Abdomen.  Soft, nondistended, bowel sounds positive. Extremities.  2+ bilateral lower extremity edema, pulses intact and symmetrical.  Assessment/Plan:  67 year old lady with symptomatic anemia, due to chronic blood loss.  She is on Coumadin, cardiology wants to keep Coumadin with a lower INR goal.  Symptomatic Anemia.  Her symptoms improved after 2 units of packed RBCs, hemoglobin stable at 8.8 today.  Patient is ready to be discharged with follow-up with her PCP.  She also got 510 mg of Feraheme yesterday.  Patient was not taking her home dose of iron supplement. -She was advised to continue taking oral iron supplement. -PCP can decide regarding getting another dose of Feraheme.  PAF Currently in sinus rhythm -continue warfarin -continue metoprolol succinate 48m  COPD.  No sign of acute exacerbation. -continue Dulera, Spiriva daily -albuterol nebulizer therapy Q6h  OSA -continue cpap  Diabetes.  She was managed with Levemir and SSI during hospital stay. Will be discharged on her home regimen.  Dysphagia.  Chronic tissue, ordered barium swallow yesterday-still pending.  Dispo: Will be discharged today-pending barium swallow.  ALorella Nimrod MD 03/28/2017, 9:15 AM Pager: [phone]

## 2017-03-28 NOTE — Progress Notes (Signed)
Subjective:  [name] was laying in bed comfortably this morning. She is feeling much better than on admission. Her fatigue and lightheadedness has improved. Had a slight headache for which she received prn medicine this morning. Denies any difficulty with urination. No new complaints.  Objective: Vital signs in last 24 hours: Vitals:   03/27/17 1435 03/27/17 1658 03/27/17 2104 03/28/17 0434  BP: (!) 139/48 (!) 148/41 138/64 (!) 156/68  Pulse: 65 74 67 70  Resp: _0 Temp: 98.4 F (36.9 C) 98.4 F (36.9 C) 97.8 F (36.6 C) 97.7 F (36.5 C)  TempSrc: Oral Oral Oral Oral  SpO2: 94% 93% 97% 96%   Weight change:   Intake/Output Summary (Last 24 hours) at 03/28/2017 2111 Last data filed at 03/28/2017 0000 Gross per 24 hour  Intake 812 ml  Output 2200 ml  Net -1388 ml   Physical Exam: General: A+O x 3, NAD, sitting comfortably HEENT: Normocephalic, atraumatic, eyes without conjunctival injection, scleral icterus, or drainage.  Some conjunctival pallor bilaterally, CV: Normal rate and rhythm. nl S1S2. Distant heart sounds. No m/r/g. Unable to assess JVD due to body habitus.  Pulm: Normal respiratory effort, no increased WOB, CTAB, distant lung sounds Abd: soft, BS+, no ttp across all 4 quadrants MSK: 2+ pitting edema in lower extremities, 1+ edema in upper extremities    Lab Results:  CBC    Component Value Date/Time   WBC 7.2 03/28/2017 0420   RBC 3.70 (L) 03/28/2017 0420   HGB 8.8 (L) 03/28/2017 0420   HGB 9.7 (L) 01/12/2017 1212   HGB 12.4 01/26/2012 0851   HCT 30.2 (L) 03/28/2017 0420   HCT 31.2 (L) 01/12/2017 1212   HCT 36.5 01/26/2012 0851   PLT 224 03/28/2017 0420   PLT 216 01/12/2017 1212   MCV 81.6 03/28/2017 0420   MCV 84 01/12/2017 1212   MCV 94.5 01/26/2012 0851   MCH 23.8 (L) 03/28/2017 0420   MCHC 29.1 (L) 03/28/2017 0420   RDW 18.2 (H) 03/28/2017 0420   RDW 17.2 (H) 01/12/2017 1212   RDW 16.0 (H) 01/26/2012 0851   LYMPHSABS 1.2  07/17/2016 1054   LYMPHSABS 1.8 01/26/2012 0851   MONOABS 1.2 (H) 06/27/2015 1139   MONOABS 0.9 01/26/2012 0851   EOSABS 0.1 07/17/2016 1054   BASOSABS 0.0 07/17/2016 1054   BASOSABS 0.0 01/26/2012 0851     Micro Results: No results found for this or any previous visit (from the past 240 hour(s)). Studies/Results: No results found. Medications: I have reviewed the patient's current medications.  Scheduled Meds: . albuterol  3 mL Inhalation BID  . buPROPion  75 mg Oral BID  . clobetasol cream  1 application Topical BID  . DULoxetine  60 mg Oral BID  . ferrous gluconate  324 mg Oral BID WC  . furosemide  40 mg Oral Daily  . gabapentin  600 mg Oral TID  . insulin aspart  0-15 Units Subcutaneous TID WC  . insulin aspart  0-5 Units Subcutaneous QHS  . insulin aspart  5 Units Subcutaneous TID WC  . insulin detemir  30 Units Subcutaneous QHS  . metoprolol succinate  25 mg Oral Daily  . mometasone-formoterol  2 puff Inhalation BID  . nicotine  14 mg Transdermal Daily  . nystatin cream  1 application Topical BID  . pantoprazole  40 mg Oral Daily  . polyethylene glycol  17 g Oral Daily  . rosuvastatin  20 mg Oral QHS  . tiotropium  18 mcg Inhalation Daily  . warfarin  2.5 mg Oral q1800  . Warfarin - Physician Dosing Inpatient   Does not apply q1800   Continuous Infusions: PRN Meds:.acetaminophen **OR** acetaminophen, ALPRAZolam, HYDROcodone-acetaminophen Assessment/Plan: Active Problems:   Symptomatic anemia   AVM (arteriovenous malformation)   Pica in adults  [name] is a 67 yo female with a past medical history of paroxysmal Afibon warfarin, mitral valve replacement,AVMs in GI tract,nonobstructive CAD, severe PAH, tobacco use disorder, COPD, and clear cell renal cancer who presents with a 3 week history of worsening fatigue, lightheadedness, SOB with exertion, and PICA. Due to ongoing symptoms of dizziness, fatigue, and PICA with Hg of 7.2 on admission, decreased  iron/ferritin, borderline low MCV, and history of AVMs and Afib with Coumadin anticoagulation, pt most likely presents with symptomatic anemia due to chronic slow bleeding.   Symptomatic anemia: Improving. Fatigue, lightheadedness, and SOB have significantly improved. Hg this AM after another 1 unit PRBCs on 2/15- 8.8 (up from 7.5 yesterday). Has received 2 units PRBCs total during this admission.  - ferrous gluconate 324 mg PO BID  Acute Urinary Retention: Resolved- voiding spontaneously. Was unable to void night of 2/14 and had an I/O cath placed which drained 600 mL of urine. Pt thinks this might have been due to feeling uncomfortable at first using the bedside commode, but has since improved.   - Continue to monitor urinary output today  PAF: Chronic, rate controlled, in sinus rhythm - Continue warfarin 2.5 mg PO qd  - Continue metoprolol succinate 14m  COPD Review of PFTs from 04/22/16 suggests component of restrictive lung disease pattern with FEV1- 45% pred, FVC- 46% pred, and FEV1/FVC ratio- 75. - Continue Dulera, Spiriva daily - PRN albuterol nebulizer therapy Q6h - Consider repeat outpt PFTs and investigation of potential restrictive etiology  OSA: chronic - continue CPAP    This is a MCareers information officerNote.  The care of the patient was discussed with Dr. AReesa Chewand the assessment and plan formulated with their assistance.  Please see their attached note for official documentation of the daily encounter.   LOS: 1 day   SIsidore Moos Medical Student 03/28/2017, 8:28 AM

## 2017-03-28 NOTE — Discharge Summary (Signed)
Name: [name] MRN: [id number] DOB: 04-Sep-1950 67 y.o. PCP: [name], MD  Date of Admission: 03/26/2017  6:24 PM Date of Discharge: 03/28/2017 Attending Physician: No att. providers found  Discharge Diagnosis: 1.  Symptomatic anemia.  Active Problems:   Symptomatic anemia   AVM (arteriovenous malformation)   Pica in adults   Discharge Medications: Allergies as of 03/28/2017      Reactions   Lorazepam Other (See Comments)   Patient's sister noted that ativan caused the patient to become extremely confused during hospitalization 09/2010; tolerates Xanax   Morphine And Related Other (See Comments)   Injection site reaction   Oxycontin [oxycodone] Other (See Comments)   headache   Tramadol Hcl Swelling   Ankle swelling      Medication List    TAKE these medications   ACCU-CHEK FASTCLIX LANCET Kit Check your blood 4 times a day dx code 250.00 insulin requiring   albuterol 108 (90 Base) MCG/ACT inhaler Commonly known as:  PROVENTIL HFA;VENTOLIN HFA Inhale 2 puffs into the lungs every 6 (six) hours as needed for wheezing or shortness of breath.   ALPRAZolam 1 MG tablet Commonly known as:  XANAX TAKE 1/2 TO 1 TABLET BY MOUTH TWICE DAILY AS NEEDED FOR ANXIETY   BD PEN NEEDLE MICRO U/F 32G X 6 MM Misc Generic drug:  Insulin Pen Needle USE TO INJECT INSULIN FID   buPROPion 75 MG tablet Commonly known as:  WELLBUTRIN Take 1 tablet (75 mg total) by mouth 2 (two) times daily.   chlorpheniramine 4 MG tablet Commonly known as:  CHLOR-TRIMETON Take 1 tablet (4 mg total) by mouth 3 (three) times daily as needed for allergies.   clobetasol cream 0.05 % Commonly known as:  TEMOVATE Apply 1 application topically 2 (two) times daily.   DULoxetine 60 MG capsule Commonly known as:  CYMBALTA Take 1 capsule (60 mg total) by mouth 2 (two) times daily.   ferrous gluconate 324 MG tablet Commonly known as:  FERGON Take 1 tablet (324 mg total) by mouth 2 (two) times  daily with a meal. What changed:  when to take this Notes to patient:  Take with breakfast and dinner    fluticasone 50 MCG/ACT nasal spray Commonly known as:  FLONASE Place 2 sprays into both nostrils daily.   Fluticasone-Salmeterol 500-50 MCG/DOSE Aepb Commonly known as:  ADVAIR DISKUS Inhale 1 puff into the lungs 2 (two) times daily.   furosemide 80 MG tablet Commonly known as:  LASIX TAKE 1 TABLET IN AM AND 1/2 TABLET AT 3PM   gabapentin 300 MG capsule Commonly known as:  NEURONTIN Take 2 capsules (600 mg total) by mouth 3 (three) times daily.   glucose blood test strip Commonly known as:  ONETOUCH VERIO 1 each by Other route QID. Use as instructed   HYDROcodone-acetaminophen 5-325 MG tablet Commonly known as:  NORCO/VICODIN Take 1-2 tablets by mouth every 6 (six) hours as needed for severe pain.   insulin aspart 100 UNIT/ML FlexPen Commonly known as:  NOVOLOG Inject 18-28 Units into the skin 3 (three) times daily with meals. What changed:    how much to take  when to take this  additional instructions   Insulin Detemir 100 UNIT/ML Pen Commonly known as:  LEVEMIR USE 37 UNITS TWICE DAILY   Insulin Syringe-Needle U-100 31G X 15/64" 0.5 ML Misc Use to inject insulin up to 4 times a day   metoprolol succinate 25 MG 24 hr tablet Commonly known as:  TOPROL-XL  Take 1 tablet (25 mg total) by mouth daily.   nystatin cream Commonly known as:  MYCOSTATIN Apply 1 application topically 2 (two) times daily.   omeprazole 40 MG capsule Commonly known as:  PRILOSEC Take 1 capsule (40 mg total) by mouth 2 (two) times daily.   ONETOUCH VERIO w/Device Kit 1 each by Does not apply route 4 (four) times daily.   OXYGEN Inhale 2 L into the lungs continuous.   potassium chloride SA 20 MEQ tablet Commonly known as:  K-DUR,KLOR-CON Take 1 tablet (20 mEq total) by mouth 2 (two) times daily.   promethazine 12.5 MG tablet Commonly known as:  PHENERGAN Take 1 tablet (12.5  mg total) by mouth every 8 (eight) hours as needed for nausea or vomiting.   rosuvastatin 20 MG tablet Commonly known as:  CRESTOR Take 1 tablet (20 mg total) by mouth at bedtime.   tiotropium 18 MCG inhalation capsule Commonly known as:  SPIRIVA HANDIHALER Place 1 capsule (18 mcg total) into inhaler and inhale daily.   triamcinolone cream 0.1 % Commonly known as:  KENALOG Apply 1 application topically 2 (two) times daily.   warfarin 2.5 MG tablet Commonly known as:  COUMADIN Take as directed. If you are unsure how to take this medication, talk to your nurse or doctor. Original instructions:  TAKE 1 TABLET BY MOUTH DAILY AT 6 PM EXCEPT ON MONDAYS Notes to patient:  Take daily except monday       Disposition and follow-up:   [name] was discharged from [facility] in Good condition.  At the hospital follow up visit please address:  1.  Her symptoms of anemia.  2.  Labs / imaging needed at time of follow-up: CBC  3.  Pending labs/ test needing follow-up: Barium swallow studies.  Follow-up Appointments:   Hospital Course by problem list:  67 year old lady with symptomatic anemia, with hemoglobin of 7.2, due to chronic blood loss.  She is on Coumadin, cardiology wants to keep Coumadin with a lower INR goal.  Symptomatic Anemia.  Her symptoms improved with 2 units of packed RBCs and 1 dose of 510 mg of Feraheme. She was advised to continue oral iron supplement-currently she was not taking her prescribed iron supplement.  Her PCP can decide if she need a parenteral iron in the future.  PAF and aortic valve replacement.  She remained in sinus rhythm during current hospitalization.  She was advised to continue warfarin with a INR goal of 1.5-2.5.  She was also advised to continue her home dose of metoprolol.  History of dysphagia.  Barium swallow was done which shows delayed oral transit time with probable mild esophageal dysmotility.  Her PCP should  be able to discuss results with her as results were not available at the time of discharge.  Diabetes.  Her blood sugar was maintained with Levemir and SSI.  She was discharged home on her home regimen.   Discharge Vitals:   BP (!) 136/59 (BP Location: Left Arm)   Pulse 65   Temp 97.9 F (36.6 C) (Oral)   Resp 16   SpO2 95%   General.  Well-developed, well-nourished lady, in no acute distress. Lungs.  Clear bilaterally, little distant breath sounds due to body habitus. CV.  Regular rate and rhythm. Abdomen.  Soft, nondistended, bowel sounds positive. Extremities.  2+ bilateral lower extremity edema, pulses intact and symmetrical.  Pertinent Labs, Studies, and Procedures:  CBC Latest Ref Rng & Units 03/28/2017 03/27/2017 03/27/2017  WBC 4.0 - 10.5 K/uL 7.2 - 7.2  Hemoglobin 12.0 - 15.0 g/dL 8.8(L) 8.9(L) 7.5(L)  Hematocrit 36.0 - 46.0 % 30.2(L) 30.0(L) 25.9(L)  Platelets 150 - 400 K/uL 224 - 217   CMP Latest Ref Rng & Units 03/27/2017 03/26/2017 10/07/2016  Glucose 65 - 99 mg/dL 194(H) 144(H) -  BUN 6 - 20 mg/dL 13 12 -  Creatinine 0.44 - 1.00 mg/dL 0.94 0.92 1.00  Sodium 135 - 145 mmol/L 136 138 -  Potassium 3.5 - 5.1 mmol/L 4.5 4.5 -  Chloride 101 - 111 mmol/L 102 102 -  CO2 22 - 32 mmol/L 23 26 -  Calcium 8.9 - 10.3 mg/dL 8.8(L) 9.2 -  Total Protein 6.5 - 8.1 g/dL - 6.5 -  Total Bilirubin 0.3 - 1.2 mg/dL - 0.4 -  Alkaline Phos 38 - 126 U/L - 62 -  AST 15 - 41 U/L - 18 -  ALT 14 - 54 U/L - 16 -   Iron/TIBC/Ferritin/ %Sat    Component Value Date/Time   IRON 13 (L) 03/26/2017 2124   TIBC 462 (H) 03/26/2017 2124   FERRITIN 5 (L) 03/26/2017 2124   FERRITIN 29 08/08/2015 1441   IRONPCTSAT 3 (L) 03/26/2017 2124   IRONPCTSAT 10 (L) 06/13/2013 0847   DG Esophagus. FINDINGS: Patient seems to have somewhat delayed oral transit times. Swallowing is otherwise within normal limits. Occasionally sluggish esophageal motility. No esophageal fold thickening, stricture or obstruction.  Patient was unable to swallow a barium tablet.  IMPRESSION: 1. Somewhat delayed oral transit at times. 2. Probable mild esophageal dysmotility.  Discharge Instructions: Discharge Instructions    Diet - low sodium heart healthy   Complete by:  As directed    Discharge instructions   Complete by:  As directed    It was pleasure taking care of you. Please take your iron supplement at least twice daily. Please follow-up with your PCP according to your scheduled appointment.   Increase activity slowly   Complete by:  As directed       Signed: Lorella Nimrod, MD 03/28/2017, 3:32 PM   Pager: [phone]

## 2017-03-30 ENCOUNTER — Other Ambulatory Visit: Payer: Self-pay | Admitting: Internal Medicine

## 2017-03-30 DIAGNOSIS — J301 Allergic rhinitis due to pollen: Secondary | ICD-10-CM

## 2017-03-30 LAB — BPAM RBC
Blood Product Expiration Date: 201902222359
Blood Product Expiration Date: 201902232359
Blood Product Expiration Date: 201902252359
Blood Product Expiration Date: 201902252359
ISSUE DATE / TIME: 201902142246
ISSUE DATE / TIME: 201902151047
Unit Type and Rh: 600
Unit Type and Rh: 600
Unit Type and Rh: 600
Unit Type and Rh: 600

## 2017-03-30 LAB — TYPE AND SCREEN
ABO/RH(D): A NEG
Antibody Screen: NEGATIVE
Unit division: 0
Unit division: 0
Unit division: 0
Unit division: 0

## 2017-04-06 ENCOUNTER — Encounter (HOSPITAL_COMMUNITY): Payer: PPO | Admitting: Cardiology

## 2017-04-16 ENCOUNTER — Ambulatory Visit: Payer: PPO | Admitting: Pharmacist

## 2017-04-16 ENCOUNTER — Ambulatory Visit (INDEPENDENT_AMBULATORY_CARE_PROVIDER_SITE_OTHER): Payer: PPO | Admitting: Internal Medicine

## 2017-04-16 ENCOUNTER — Encounter: Payer: Self-pay | Admitting: Internal Medicine

## 2017-04-16 VITALS — BP 146/50 | HR 66 | Temp 98.6°F | Wt 194.8 lb

## 2017-04-16 DIAGNOSIS — F17210 Nicotine dependence, cigarettes, uncomplicated: Secondary | ICD-10-CM | POA: Diagnosis not present

## 2017-04-16 DIAGNOSIS — J439 Emphysema, unspecified: Secondary | ICD-10-CM

## 2017-04-16 DIAGNOSIS — I5032 Chronic diastolic (congestive) heart failure: Secondary | ICD-10-CM

## 2017-04-16 DIAGNOSIS — Z9889 Other specified postprocedural states: Secondary | ICD-10-CM

## 2017-04-16 DIAGNOSIS — Z885 Allergy status to narcotic agent status: Secondary | ICD-10-CM

## 2017-04-16 DIAGNOSIS — Z794 Long term (current) use of insulin: Secondary | ICD-10-CM

## 2017-04-16 DIAGNOSIS — I700 Atherosclerosis of aorta: Secondary | ICD-10-CM

## 2017-04-16 DIAGNOSIS — F322 Major depressive disorder, single episode, severe without psychotic features: Secondary | ICD-10-CM

## 2017-04-16 DIAGNOSIS — E1140 Type 2 diabetes mellitus with diabetic neuropathy, unspecified: Secondary | ICD-10-CM

## 2017-04-16 DIAGNOSIS — Z7901 Long term (current) use of anticoagulants: Secondary | ICD-10-CM | POA: Diagnosis not present

## 2017-04-16 DIAGNOSIS — Z7951 Long term (current) use of inhaled steroids: Secondary | ICD-10-CM

## 2017-04-16 DIAGNOSIS — I480 Paroxysmal atrial fibrillation: Secondary | ICD-10-CM | POA: Diagnosis not present

## 2017-04-16 DIAGNOSIS — Z79899 Other long term (current) drug therapy: Secondary | ICD-10-CM

## 2017-04-16 DIAGNOSIS — K5521 Angiodysplasia of colon with hemorrhage: Secondary | ICD-10-CM | POA: Diagnosis not present

## 2017-04-16 DIAGNOSIS — F332 Major depressive disorder, recurrent severe without psychotic features: Secondary | ICD-10-CM | POA: Diagnosis not present

## 2017-04-16 DIAGNOSIS — Z6835 Body mass index (BMI) 35.0-35.9, adult: Secondary | ICD-10-CM | POA: Diagnosis not present

## 2017-04-16 DIAGNOSIS — M549 Dorsalgia, unspecified: Secondary | ICD-10-CM

## 2017-04-16 DIAGNOSIS — D500 Iron deficiency anemia secondary to blood loss (chronic): Secondary | ICD-10-CM | POA: Diagnosis not present

## 2017-04-16 DIAGNOSIS — K5520 Angiodysplasia of colon without hemorrhage: Secondary | ICD-10-CM

## 2017-04-16 DIAGNOSIS — Z5181 Encounter for therapeutic drug level monitoring: Secondary | ICD-10-CM | POA: Diagnosis not present

## 2017-04-16 LAB — POCT GLYCOSYLATED HEMOGLOBIN (HGB A1C): Hemoglobin A1C: 5.9

## 2017-04-16 LAB — GLUCOSE, CAPILLARY: Glucose-Capillary: 113 mg/dL — ABNORMAL HIGH (ref 65–99)

## 2017-04-16 LAB — POCT INR: INR: 2

## 2017-04-16 MED ORDER — IPRATROPIUM-ALBUTEROL 0.5-2.5 (3) MG/3ML IN SOLN: 3.0000 mL | Freq: Four times a day (QID) | RESPIRATORY_TRACT | 3 refills | Status: DC | PRN

## 2017-04-16 NOTE — Progress Notes (Signed)
Co-visit

## 2017-04-16 NOTE — Progress Notes (Signed)
Anticoagulation Management [name] is a 67 y.o. female who is on warfarin treatment.    Indication: atrial fibrillation   Duration: indefinite Supervising physician: [name]  Anticoagulation Clinic Visit History: Patient does not report signs/symptoms of bleeding, no recent changes affecting warfarin therapy  Anticoagulation Episode Summary    Current INR goal:   1.5-2.5  TTR:   81.2 % (4.1 y)  Next INR check:   05/14/2017  INR from last check:   2.0 (04/16/2017)  Weekly max warfarin dose:     Target end date:     INR check location:   [facility]  Preferred lab:     Send INR reminders to:   [facility] [place]   Indications   Paroxysmal atrial fibrillation ([place]) [I48.0] Long term current use of anticoagulant therapy [Z79.01]       Comments:         Anticoagulation Care Providers    Provider Role Specialty Phone number   Lelon Perla, MD  Cardiology [phone]     Allergies  Allergen Reactions  . Lorazepam Other (See Comments)    Patient's sister noted that ativan caused the patient to become extremely confused during hospitalization 09/2010; tolerates Xanax  . Morphine And Related Other (See Comments)    Injection site reaction  . Oxycontin [Oxycodone] Other (See Comments)    headache  . Tramadol Hcl Swelling    Ankle swelling   Medication Sig  albuterol (PROVENTIL HFA;VENTOLIN HFA) 108 (90 Base) MCG/ACT inhaler Inhale 2 puffs into the lungs every 6 (six) hours as needed for wheezing or shortness of breath.  ALPRAZolam (XANAX) 1 MG tablet TAKE 1/2 TO 1 TABLET BY MOUTH TWICE DAILY AS NEEDED FOR ANXIETY  BD ULTRA-FINE MICRO PEN NEEDLE 32G X 6 MM MISC USE TO INJECT INSULIN FID  Blood Glucose Monitoring Suppl (ONETOUCH VERIO) W/DEVICE KIT 1 each by Does not apply route 4 (four) times daily.  buPROPion (WELLBUTRIN) 75 MG tablet Take 1 tablet (75 mg total) by mouth 2 (two) times daily.  chlorpheniramine (CHLOR-TRIMETON) 4 MG tablet  Take 1 tablet (4 mg total) by mouth 3 (three) times daily as needed for allergies.  clobetasol cream (TEMOVATE) 2.83 % Apply 1 application topically 2 (two) times daily.  DULoxetine (CYMBALTA) 60 MG capsule Take 1 capsule (60 mg total) by mouth 2 (two) times daily.  ferrous gluconate (FERGON) 324 MG tablet Take 1 tablet (324 mg total) by mouth 2 (two) times daily with a meal. Patient taking differently: Take 324 mg by mouth daily with breakfast.   fluticasone (FLONASE) 50 MCG/ACT nasal spray Place 2 sprays into both nostrils daily.  Fluticasone-Salmeterol (ADVAIR DISKUS) 500-50 MCG/DOSE AEPB Inhale 1 puff into the lungs 2 (two) times daily.  furosemide (LASIX) 80 MG tablet TAKE 1 TABLET IN AM AND 1/2 TABLET AT 3PM  gabapentin (NEURONTIN) 300 MG capsule Take 2 capsules (600 mg total) by mouth 3 (three) times daily.  glucose blood (ONETOUCH VERIO) test strip 1 each by Other route QID. Use as instructed  HYDROcodone-acetaminophen (NORCO/VICODIN) 5-325 MG tablet Take 1-2 tablets by mouth every 6 (six) hours as needed for severe pain.  insulin aspart (NOVOLOG) 100 UNIT/ML FlexPen Inject 18-28 Units into the skin 3 (three) times daily with meals. Patient taking differently: Inject 18 Units into the skin 4 (four) times daily. Pt states that she takes 4 times daily. Depending on how much she eats  Insulin Detemir (LEVEMIR) 100 UNIT/ML Pen USE 37 UNITS TWICE DAILY  Insulin Syringe-Needle U-100 31G X 15/64" 0.5 ML MISC Use to inject insulin up to 4 times a day  ipratropium-albuterol (DUONEB) 0.5-2.5 (3) MG/3ML SOLN Take 3 mLs by nebulization every 6 (six) hours as needed (as needed for shortness of breath).  Lancets Misc. (ACCU-CHEK FASTCLIX LANCET) KIT Check your blood 4 times a day dx code 250.00 insulin requiring  metoprolol succinate (TOPROL-XL) 25 MG 24 hr tablet Take 1 tablet (25 mg total) by mouth daily.  nystatin cream (MYCOSTATIN) Apply 1 application topically 2 (two) times daily.  omeprazole  (PRILOSEC) 40 MG capsule Take 1 capsule (40 mg total) by mouth 2 (two) times daily.  OXYGEN Inhale 2 L into the lungs continuous.  potassium chloride SA (K-DUR,KLOR-CON) 20 MEQ tablet Take 1 tablet (20 mEq total) by mouth 2 (two) times daily.  promethazine (PHENERGAN) 12.5 MG tablet Take 1 tablet (12.5 mg total) by mouth every 8 (eight) hours as needed for nausea or vomiting.  rosuvastatin (CRESTOR) 20 MG tablet Take 1 tablet (20 mg total) by mouth at bedtime.  tiotropium (SPIRIVA HANDIHALER) 18 MCG inhalation capsule Place 1 capsule (18 mcg total) into inhaler and inhale daily.  triamcinolone cream (KENALOG) 0.1 % Apply 1 application topically 2 (two) times daily.  warfarin (COUMADIN) 2.5 MG tablet TAKE 1 TABLET BY MOUTH DAILY AT 6 PM EXCEPT ON MONDAYS   Past Medical History:  Diagnosis Date  . Adenomatous polyps 05/14/2011   Colonoscopy (05/2011): 4 mm adenomatous polyp excised endoscopically Colonoscopy (02/2002): Adenomatous polyp excised endoscopically   . Allergic rhinitis 06/01/2012  . Anemia of chronic disease 01/01/2013  . Anxiety 07/24/2010  . Aortic atherosclerosis ([place]) 10/19/2014   Seen on CT scan, currently asymptomatic  . 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 Asymptomatic cholelithiasis 09/25/2015   Seen on CT scan 08/2015  . Carotid artery stenosis    s/p right endarterectomy (06/2010) Carotid US (07/2010):  Left: Moderate-to-severe (60-79%) calcific and non-calcific plaque origin and proximal ICA and ECA   . Chronic congestive heart failure with left ventricular diastolic dysfunction ([place]) 10/21/2010  . Chronic constipation 02/03/2011  . Chronic daily headache 01/16/2014  . Chronic low back pain 10/06/2012  . Chronic venous insufficiency 08/04/2012  . Clear cell renal cell carcinoma ([place]) 07/21/2011   s/p cryoablation of left RCC in 09/2011 by [name]. Followed by [name]  [facility]  Urology) .    Marland Kitchen COPD (chronic obstructive pulmonary disease) with emphysema (HCC)    PFTs 02/2012: FEV1 0.92 (40%), ratio 69, 27% increase in FEV1 with BD, TLC 91%, severe airtrapping, DLCO49% On chronic home O2. Pulmonary rehab referral 05/2012   . Depression 11/19/2005  . Esophageal stenosis 08/08/2015   Mild, benign-appearing on EGD 06/29/2015  . Fibromyalgia 08/29/2010  . Gastroesophageal reflux disease   . History of blood transfusion    "several times"   . Hyperlipidemia LDL goal < 100 11/20/2005  . Internal hemorrhoids 08/04/2012  . Lichen sclerosus of female genitalia 01/12/2017  . Mitral stenosis    s/p Mitral valve replacement with a 27-mm pericardial porcine valve (Medtronic Mosaic valve, serial #[id number] on 09/20/10, Dr. Prescott Gum)   . Moderate to severe pulmonary hypertension ([place])    2014 TEE w PA peak pressure 46 mmHg, s/p MV replacement   . Moderately severe major depression ([place]) 11/19/2005  . Obesity (BMI 30.0-34.9) 10/23/2011  . Obstructive sleep apnea    Nocturnal polysomnography (06/2009): Moderate sleep apnea/ hypopnea syndrome ,  AHI 17.8 per hour with nonpositional hypopneas. CPAP titration to 12 CWP, AHI 2.4 per hour. On nocturnal CPAP via a small resMed Quattro full-face mask with heated humidifier.   . On home oxygen therapy    "2.5L; anytime I sleep; day or night" (03/26/2017)  . OSA on CPAP   . Osteoporosis    DEXA (12/09/2011): L-spine T -3.7, left hip T -1.4 DEXA (12/2004): L-spine T -2.6, left hip -0.1   . Paroxysmal atrial fibrillation ([place]) 10/22/2010   s/p Left atrial maze procedure for paroxysmal atrial fibrillation on 09/20/2010 by Dr Prescott Gum.  Subsequent splenic infarct, decision was made to re-anticoagulate with coumadin, likely life-long as this is the most likely cause of the splenic infarct.   . Pulmonary hypertension due to chronic obstructive pulmonary disease ([place]) 04/25/2016  . Right nephrolithiasis 09/06/2014   5 mm non-obstructing calculus seen on CT  scan 09/05/2014   . Seborrheic keratosis 09/28/2015  . Severe obesity (BMI 35.0-39.9) with comorbidity ([place]) 10/23/2011  . Shortness of breath dyspnea   . Tobacco abuse 07/28/2012  . Type 2 diabetes mellitus with diabetic neuropathy (HCC)    Social History   Socioeconomic History  . Marital status: Divorced    Spouse name: None  . Number of children: None  . Years of education: None  . Highest education level: None  Social Needs  . Financial resource strain: None  . Food insecurity - worry: None  . Food insecurity - inability: None  . Transportation needs - medical: None  . Transportation needs - non-medical: None  Occupational History  . None  Tobacco Use  . Smoking status: Current Every Day Smoker    Packs/day: 1.00    Years: 54.00    Pack years: 54.00    Types: Cigarettes    Start date: 11/01/1962  . Smokeless tobacco: Never Used  . Tobacco comment: 03/26/2017 "cutting back; not trying to quit; was smoking 2 ppd"  Substance and Sexual Activity  . Alcohol use: No    Alcohol/week: 0.0 oz  . Drug use: No  . Sexual activity: No    Birth control/protection: Post-menopausal  Other Topics Concern  . None  Social History Narrative   L[place] in [place] with 60 pound chow   Worked at [employer] for 18 years   No car   Family History  Problem Relation Age of Onset  . Peptic Ulcer Disease Father   . Heart attack Father 50       Died of MI at age 26  . Heart attack Brother 49       Died of MI at age 33  . Obesity Brother   . Pneumonia Mother   . Healthy Sister   . Lupus Daughter   . Obsessive Compulsive Disorder Daughter    ASSESSMENT Recent Results: Lab Results  Component Value Date   INR 2.0 04/16/2017   INR 1.45 03/28/2017   INR 1.60 03/27/2017   Anticoagulation Dosing: Description   Take ONE (1)  tablet of your 2.32m green-colored warfarin tablet by mouth, once-daily at 6PM all days of week, EXCEPT on Mondays. Do NOT take any  warfarin on Mondays of each week.      INR today: Therapeutic  PLAN Weekly dose was unchanged  Patient Instructions  Continue current warfarin dose, follow up in 4 weeks  Patient advised to contact clinic or seek medical attention if signs/symptoms of bleeding or thromboembolism occur.  Patient verbalized understanding by repeating back  information and was advised to contact me if further medication-related questions arise. Patient was also provided an information handout.  Follow-up 05/14/17  Flossie Dibble

## 2017-04-16 NOTE — Progress Notes (Signed)
5.9

## 2017-04-16 NOTE — Patient Instructions (Signed)
It was good to see you again.  I am sorry you are not feeling well.  1) Lower the mealtime insulin to 15 units four times a day as a base.  Continue the levimir as you are.  2) I sent a referral to behavioral health in hopes of getting some help in managing your depression.  3) I order a nebulizer machine and medication.  Use it up to 4 times a day as needed.  4) I ordered a blood count to make sure you do not need a transfusion.  5) Keep taking the other medications as you are.

## 2017-04-17 ENCOUNTER — Other Ambulatory Visit: Payer: Self-pay | Admitting: Internal Medicine

## 2017-04-17 DIAGNOSIS — J301 Allergic rhinitis due to pollen: Secondary | ICD-10-CM

## 2017-04-17 LAB — CBC
Hematocrit: 35.8 % (ref 34.0–46.6)
Hemoglobin: 11.3 g/dL (ref 11.1–15.9)
MCH: 27.1 pg (ref 26.6–33.0)
MCHC: 31.6 g/dL (ref 31.5–35.7)
MCV: 86 fL (ref 79–97)
Platelets: 279 10*3/uL (ref 150–379)
RBC: 4.17 x10E6/uL (ref 3.77–5.28)
RDW: 24.2 % — ABNORMAL HIGH (ref 12.3–15.4)
WBC: 8 10*3/uL (ref 3.4–10.8)

## 2017-04-17 MED ORDER — FLUTICASONE PROPIONATE 50 MCG/ACT NA SUSP: 2.0000 | Freq: Every day | NASAL | 1 refills | Status: DC

## 2017-04-17 NOTE — Telephone Encounter (Signed)
Refill Request for Flonase

## 2017-04-17 NOTE — Telephone Encounter (Signed)
Patient is calling about lab results. Patient may take a nap, if she doesn't answer please leave it on her [email or web address]

## 2017-04-20 ENCOUNTER — Encounter: Payer: Self-pay | Admitting: Internal Medicine

## 2017-04-20 DIAGNOSIS — J449 Chronic obstructive pulmonary disease, unspecified: Secondary | ICD-10-CM | POA: Diagnosis not present

## 2017-04-20 DIAGNOSIS — J439 Emphysema, unspecified: Secondary | ICD-10-CM | POA: Diagnosis not present

## 2017-04-20 DIAGNOSIS — G4733 Obstructive sleep apnea (adult) (pediatric): Secondary | ICD-10-CM | POA: Diagnosis not present

## 2017-04-20 DIAGNOSIS — I509 Heart failure, unspecified: Secondary | ICD-10-CM | POA: Diagnosis not present

## 2017-04-20 NOTE — Progress Notes (Signed)
   Subjective:    Patient ID: [name], female    DOB: 02-01-1951, 67 y.o.   MRN: [id number]  Depression         Associated symptoms include appetite change.   [name] is here for hospital follow-up after admission for symptomatic blood loss anemia related to her chronic blood loss from her GI AVMs and required chronic anticoagulation for her paroxysmal atrial fibrillation.  She was transfused 1 unit of blood and given IV iron during her admission. She is also being followed up for her diabetes, depression, and emphysema.  Please see the A&P for the status of the pt's chronic medical problems.  Review of Systems  Constitutional: Positive for appetite change. Negative for activity change and unexpected weight change.  Respiratory: Positive for cough, shortness of breath and wheezing. Negative for chest tightness.   Cardiovascular: Negative for chest pain, palpitations and leg swelling.  Gastrointestinal: Negative for abdominal distention, abdominal pain, constipation, diarrhea, nausea and vomiting.  Musculoskeletal: Positive for arthralgias and back pain. Negative for joint swelling.  Skin: Negative for rash and wound.  Psychiatric/Behavioral: Positive for depression and dysphoric mood.      Objective:   Physical Exam  Constitutional: She is oriented to person, place, and time. She appears well-developed and well-nourished. No distress.  HENT:  Head: Normocephalic and atraumatic.  Eyes: Conjunctivae are normal. Right eye exhibits no discharge. Left eye exhibits no discharge. No scleral icterus.  Cardiovascular: Normal rate, regular rhythm and normal heart sounds. Exam reveals no gallop and no friction rub.  No murmur heard. Pulmonary/Chest: Effort normal and breath sounds normal. No respiratory distress. She has no wheezes. She has no rales.  Musculoskeletal: Normal range of motion. She exhibits no edema, tenderness or deformity.  Neurological: She is alert and oriented  to person, place, and time. She exhibits normal muscle tone.  Skin: Skin is warm and dry. No rash noted. She is not diaphoretic. No erythema.  Psychiatric: She has a normal mood and affect. Her behavior is normal. Judgment and thought content normal.  Nursing note and vitals reviewed.     Assessment & Plan:   Please see problem based charting.

## 2017-04-20 NOTE — Assessment & Plan Note (Signed)
Assessment  She recently was found to have symptomatic iron deficiency anemia presumably related to chronic GI blood loss from her AVMs in the setting of continued anticoagulation for her paroxysmal atrial fibrillation. This required admission and one unit of packed with red blood cell transfusion along with infusion of IV iron. Her hemoglobin today was greater than 11 despite the fact that she had mentioned she was chewing ice once again.  Plan  She will continue with her oral iron therapy as prescribed. We will reassess for symptoms of anemia at the follow-up visit. We are continuing the warfarin and have modified the goal to 1.5-2.5 and will continue with this target.

## 2017-04-20 NOTE — Assessment & Plan Note (Signed)
Assessment  She is continuing to lose weight and her BMI is now less than 36. Unfortunately, some of this weight loss is likely related to a poor appetite from her depression rather than an attempt at decreasing her caloric intake. We are able to decrease her insulin regimen and this may help with weight loss. This could be offset if we are able to improve her depression and appetite.  Plan  We have made changes in her insulin regimen and have referred her to behavioral health and we will see how these interventions impact her weight. I am hopeful she will continue to lose weight in a healthy manner and drop her BMI under 35. We will reassess at the follow-up visit.

## 2017-04-20 NOTE — Assessment & Plan Note (Signed)
Assessment  She remains without signs or symptoms of claudication and symptomatic peripheral vascular occlusive disease.  Plan  We will continue to work on risk factor modification including hyperlipidemia, control of hypertension, and diabetes. Each and every visit we will address her interest in smoking cessation.

## 2017-04-20 NOTE — Assessment & Plan Note (Signed)
Assessment  Her pulse today was 66 on Toprol-XL 25 mg by mouth daily. Her INR was 2.0 on warfarin. On examination she was regularly regular suggesting a sinus rhythm.  Plan  We will continue the Toprol-XL at 25 mg by mouth daily in order to control her ventricular rate should she flip back into atrial fibrillation. In addition, we will continue the warfarin at the current dosing. She is followed closely in the anticoagulation clinic.

## 2017-04-20 NOTE — Assessment & Plan Note (Signed)
Assessment  She remains inconsistently compliant with her diuretics. That said, her weight is down 8 pounds in the last 3 months. Some of this may be related to her decreased appetite from her depression, but it also suggests, despite not consistently using her diuretics, her chronic diastolic heart failure is stable.  Plan  She was advised to take her diuretics as prescribed. In addition, she was asked to continue her Toprol-XL 25 mg by mouth daily. If her afterload remains elevated at the follow-up visit we may make further adjustments in her regimen including the initiation of an ACE inhibitor.

## 2017-04-20 NOTE — Assessment & Plan Note (Signed)
>>  ASSESSMENT AND PLAN FOR CHRONIC CHF (CONGESTIVE HEART FAILURE) (HCC) WRITTEN ON 04/20/2017 11:11 AM BY Doneen Poisson, MD  Assessment  She remains inconsistently compliant with her diuretics. That said, her weight is down 8 pounds in the last 3 months. Some of this may be related to her decreased appetite from her depression, but it also suggests, despite not consistently using her diuretics, her chronic diastolic heart failure is stable.  Plan  She was advised to take her diuretics as prescribed. In addition, she was asked to continue her Toprol-XL 25 mg by mouth daily. If her afterload remains elevated at the follow-up visit we may make further adjustments in her regimen including the initiation of an ACE inhibitor.

## 2017-04-20 NOTE — Assessment & Plan Note (Signed)
Assessment  Her diabetes is excessively well controlled with a hemoglobin A1c of 5.9. This may be related to a continuance of her insulin at the doses she has typically used in the setting of a decreased appetite. She brought her glucose meter and the results were downloaded and demonstrated an average glucose of 205 with most readings being elevated. This is on 37 units of Levemir twice daily and mealtime NovoLog of at least 18 units and sometimes adjusted upwards. Given her measured average glucose of 205 and her hemoglobin A1c of 5.9 my concern is that her diabetes may be excessively well controlled, albeit she did receive 1 unit of packed red blood cells as well as an insulin infusion and therefore the hemoglobin A1c likely is somewhat of an underestimate of her control. Given the clinic conflicting data and her recent medical events I am leaning towards reducing her mealtime insulin, at least temporarily, until we can sort out the actual state of her glycemic control as hypoglycemia is likely more detrimental than temporary hyperglycemia. With regards to her peripheral neuropathy the gabapentin is doing well in controlling her symptoms.  Plan  She will continue the Levemir 37 units twice daily and decrease the NovoLog to 15 units maximum at meals. We will reassess her glycemic control at the follow-up visit with a repeat hemoglobin A1c, and assuming she does not get any more blood transfusions, will be a true reflection of her overall glycemic control. Further adjustments will be made at that time. We will continue the gabapentin at 600 mg 3 times daily for her peripheral neuropathy.

## 2017-04-20 NOTE — Assessment & Plan Note (Signed)
Assessment  She continues to have a PHQ 9 score of 16 despite Cymbalta and bupropion. She states she sleeps most of the day, has decreased appetite, and no interest in any activities that she used to enjoy such as going on the computer or using social media. Finally, she feels very lonely and states she has absolutely nothing to look forward to any more. Part of this seems to be related to her sister not giving her the attention she has in the past. After discussion, she now appears to be ready to seek further help in [facility] with a psychiatrist.  Plan  We will continue the Cymbalta and bupropion for the time being. I have submitted an ambulatory referral to behavioral health for psychiatric services specifically asking for assistance in adjusting her pharmacologic regimen given I have failed on multiple attempts with various pharmacologic agents. She remains steadfast in her uninterest in psychotherapy at this time and is clear that she is only interested in pharmacologic intervention. We will reassess her depressive symptoms with a repeat PHQ 9 score at follow-up after she is seen by psychiatry and on a presumably new regimen.

## 2017-04-20 NOTE — Assessment & Plan Note (Signed)
Assessment  She states that each time she is hospitalized she is given her bronchodilators via nebulizer. For some reason, she states she symptomatically feels improved with this method of delivery. I suspect some of this may be related to technique. Nonetheless, given that she is not very ambulatory and rarely leaves the house, she has requested a nebulizer machine and bronchodilators to be used with the machine.  Plan  A request for a nebulizer machine was placed and she was prescribed duo nebs 3 mL's every 6 hours as needed for her emphysema. For the time being we will continue with the Advair and Spiriva and reassess the frequency of use of the nebulized formulation at the follow-up visit in determining whether or not these inhalers are still required.

## 2017-04-24 ENCOUNTER — Encounter: Payer: PPO | Admitting: Internal Medicine

## 2017-04-28 ENCOUNTER — Other Ambulatory Visit (HOSPITAL_COMMUNITY): Payer: Self-pay | Admitting: Cardiology

## 2017-04-28 ENCOUNTER — Other Ambulatory Visit: Payer: Self-pay | Admitting: Internal Medicine

## 2017-04-28 DIAGNOSIS — B3731 Acute candidiasis of vulva and vagina: Secondary | ICD-10-CM

## 2017-04-28 DIAGNOSIS — B373 Candidiasis of vulva and vagina: Secondary | ICD-10-CM

## 2017-05-02 ENCOUNTER — Ambulatory Visit (HOSPITAL_COMMUNITY): Payer: PPO | Admitting: Psychiatry

## 2017-05-02 NOTE — Progress Notes (Deleted)
Patient called [facility] assessment office. She says that she needs to cancel her appointment today at 1400, but she isn't able to get through on the [phone]. Patient reports she has a stomach bug.   Arnold Long, [place] [facility] Specialist

## 2017-05-04 ENCOUNTER — Telehealth: Payer: Self-pay | Admitting: Internal Medicine

## 2017-05-04 NOTE — Telephone Encounter (Signed)
Patient is requesting Dr.Klima to call her back

## 2017-05-04 NOTE — Telephone Encounter (Signed)
Returned call to patient. Requesting lab results from 04/16/2017. Notified that everything was WNL. No longer anemic following blood transfusion. Would still like Dr. Eppie Gibson to call her when he returns. Does not need another Attending to call at this time. Hubbard Hartshorn, RN, BSN

## 2017-05-04 NOTE — Telephone Encounter (Signed)
Dr. Eppie Gibson is out of town. What does she need help with?

## 2017-05-04 NOTE — Telephone Encounter (Signed)
Thank you

## 2017-05-05 ENCOUNTER — Telehealth: Payer: Self-pay | Admitting: Pharmacist

## 2017-05-05 NOTE — Telephone Encounter (Signed)
This patient would like for you to give her a call back .

## 2017-05-11 NOTE — Telephone Encounter (Signed)
Thank you [name]! Spoke to patient.

## 2017-05-12 DIAGNOSIS — E119 Type 2 diabetes mellitus without complications: Secondary | ICD-10-CM | POA: Diagnosis not present

## 2017-05-12 DIAGNOSIS — Z961 Presence of intraocular lens: Secondary | ICD-10-CM | POA: Diagnosis not present

## 2017-05-12 DIAGNOSIS — H04123 Dry eye syndrome of bilateral lacrimal glands: Secondary | ICD-10-CM | POA: Diagnosis not present

## 2017-05-12 DIAGNOSIS — H53002 Unspecified amblyopia, left eye: Secondary | ICD-10-CM | POA: Diagnosis not present

## 2017-05-14 ENCOUNTER — Ambulatory Visit: Payer: PPO | Admitting: Pharmacist

## 2017-05-17 ENCOUNTER — Telehealth: Payer: Self-pay | Admitting: Internal Medicine

## 2017-05-17 NOTE — Telephone Encounter (Signed)
   Reason for call:   I received a call from [name] at 10:30 PM indicating that she accidentally took  2 extra doses of Vicodin.   Pertinent Data:   Patient took her Vicodin around 7:50 PM, while taking her evening meds at 10 PM she accidentally retook  2 tablets of Vicodin.  Patient denies any difficulty breathing or increased somnolence.   Assessment / Plan / Recommendations:   Patient was reassured that it should be okay, but if she experience difficulty breathing she should contact us again or visit ED.  As always, pt is advised that if symptoms worsen or new symptoms arise, they should go to an urgent care facility or to to ER for further evaluation.   Kathryn Nimrod, MD   05/17/2017, 10:38 PM

## 2017-05-19 ENCOUNTER — Telehealth (HOSPITAL_COMMUNITY): Payer: Self-pay | Admitting: Cardiology

## 2017-05-19 ENCOUNTER — Other Ambulatory Visit (HOSPITAL_COMMUNITY): Payer: Self-pay | Admitting: Interventional Radiology

## 2017-05-19 ENCOUNTER — Telehealth: Payer: Self-pay

## 2017-05-19 ENCOUNTER — Ambulatory Visit (HOSPITAL_COMMUNITY)
Admission: RE | Admit: 2017-05-19 | Discharge: 2017-05-19 | Disposition: A | Discharge status: Home / Self Care | Payer: PPO | Source: Ambulatory Visit | Attending: Cardiology | Admitting: Cardiology

## 2017-05-19 ENCOUNTER — Other Ambulatory Visit: Payer: Self-pay

## 2017-05-19 ENCOUNTER — Other Ambulatory Visit (HOSPITAL_COMMUNITY): Payer: Self-pay

## 2017-05-19 ENCOUNTER — Other Ambulatory Visit: Payer: Self-pay | Admitting: *Deleted

## 2017-05-19 ENCOUNTER — Encounter (HOSPITAL_COMMUNITY): Payer: Self-pay | Admitting: Cardiology

## 2017-05-19 VITALS — BP 142/58 | HR 76 | Temp 98.3°F | Wt 195.0 lb

## 2017-05-19 DIAGNOSIS — I2510 Atherosclerotic heart disease of native coronary artery without angina pectoris: Secondary | ICD-10-CM | POA: Insufficient documentation

## 2017-05-19 DIAGNOSIS — C642 Malignant neoplasm of left kidney, except renal pelvis: Secondary | ICD-10-CM

## 2017-05-19 DIAGNOSIS — G4733 Obstructive sleep apnea (adult) (pediatric): Secondary | ICD-10-CM | POA: Diagnosis not present

## 2017-05-19 DIAGNOSIS — I5032 Chronic diastolic (congestive) heart failure: Secondary | ICD-10-CM | POA: Diagnosis not present

## 2017-05-19 DIAGNOSIS — Z794 Long term (current) use of insulin: Secondary | ICD-10-CM | POA: Insufficient documentation

## 2017-05-19 DIAGNOSIS — Z953 Presence of xenogenic heart valve: Secondary | ICD-10-CM | POA: Diagnosis not present

## 2017-05-19 DIAGNOSIS — J449 Chronic obstructive pulmonary disease, unspecified: Secondary | ICD-10-CM | POA: Diagnosis not present

## 2017-05-19 DIAGNOSIS — Z79899 Other long term (current) drug therapy: Secondary | ICD-10-CM | POA: Diagnosis not present

## 2017-05-19 DIAGNOSIS — I272 Pulmonary hypertension, unspecified: Secondary | ICD-10-CM | POA: Insufficient documentation

## 2017-05-19 DIAGNOSIS — Z7901 Long term (current) use of anticoagulants: Secondary | ICD-10-CM | POA: Insufficient documentation

## 2017-05-19 DIAGNOSIS — K922 Gastrointestinal hemorrhage, unspecified: Secondary | ICD-10-CM | POA: Diagnosis not present

## 2017-05-19 DIAGNOSIS — R52 Pain, unspecified: Secondary | ICD-10-CM

## 2017-05-19 DIAGNOSIS — E785 Hyperlipidemia, unspecified: Secondary | ICD-10-CM | POA: Diagnosis not present

## 2017-05-19 DIAGNOSIS — I480 Paroxysmal atrial fibrillation: Secondary | ICD-10-CM | POA: Diagnosis not present

## 2017-05-19 DIAGNOSIS — E119 Type 2 diabetes mellitus without complications: Secondary | ICD-10-CM | POA: Diagnosis not present

## 2017-05-19 DIAGNOSIS — I2722 Pulmonary hypertension due to left heart disease: Secondary | ICD-10-CM

## 2017-05-19 DIAGNOSIS — N2889 Other specified disorders of kidney and ureter: Secondary | ICD-10-CM

## 2017-05-19 DIAGNOSIS — I50810 Right heart failure, unspecified: Secondary | ICD-10-CM

## 2017-05-19 DIAGNOSIS — D649 Anemia, unspecified: Secondary | ICD-10-CM | POA: Insufficient documentation

## 2017-05-19 LAB — BASIC METABOLIC PANEL
Anion gap: 10 (ref 5–15)
BUN: 20 mg/dL (ref 6–20)
CO2: 25 mmol/L (ref 22–32)
Calcium: 9 mg/dL (ref 8.9–10.3)
Chloride: 100 mmol/L — ABNORMAL LOW (ref 101–111)
Creatinine, Ser: 1.07 mg/dL — ABNORMAL HIGH (ref 0.44–1.00)
GFR calc Af Amer: >60 mL/min (ref >60)
GFR calc non Af Amer: 52 mL/min — ABNORMAL LOW (ref >60)
Glucose, Bld: 141 mg/dL — ABNORMAL HIGH (ref 65–99)
Potassium: 3.9 mmol/L (ref 3.5–5.1)
Sodium: 135 mmol/L (ref 135–145)

## 2017-05-19 LAB — LIPID PANEL
Cholesterol: 119 mg/dL (ref 0–200)
HDL: 32 mg/dL — ABNORMAL LOW (ref >40)
LDL Cholesterol: 36 mg/dL (ref 0–99)
Total CHOL/HDL Ratio: 3.7 RATIO
Triglycerides: 254 mg/dL — ABNORMAL HIGH (ref <150)
VLDL: 51 mg/dL — ABNORMAL HIGH (ref 0–40)

## 2017-05-19 LAB — CBC
HCT: 38.7 % (ref 36.0–46.0)
Hemoglobin: 12.1 g/dL (ref 12.0–15.0)
MCH: 28.9 pg (ref 26.0–34.0)
MCHC: 31.3 g/dL (ref 30.0–36.0)
MCV: 92.6 fL (ref 78.0–100.0)
Platelets: 240 10*3/uL (ref 150–400)
RBC: 4.18 MIL/uL (ref 3.87–5.11)
RDW: 19.7 % — ABNORMAL HIGH (ref 11.5–15.5)
WBC: 9.7 10*3/uL (ref 4.0–10.5)

## 2017-05-19 LAB — PROTIME-INR
INR: 1.63
Prothrombin Time: 19.2 seconds — ABNORMAL HIGH (ref 11.4–15.2)

## 2017-05-19 MED ORDER — FUROSEMIDE 80 MG PO TABS: 80.0000 mg | ORAL_TABLET | Freq: Every day | ORAL | 5 refills | Status: DC

## 2017-05-19 NOTE — Telephone Encounter (Signed)
Requesting Bonnita Nasuti to call back.

## 2017-05-19 NOTE — Patient Instructions (Signed)
Take Furosemide 80 mg (1 tab) daily  We will call you about scheduling for IV Feraheme  Labs drawn today (if we do not call you, then your lab work was stable)   Your physician has requested that you have an echocardiogram. Echocardiography is a painless test that uses sound waves to create images of your heart. It provides your doctor with information about the size and shape of your heart and how well your heart's chambers and valves are working. This procedure takes approximately one hour. There are no restrictions for this procedure.  They will call you   Your physician recommends that you schedule a follow-up appointment in: 2 weeks with NP and labs

## 2017-05-20 NOTE — Progress Notes (Signed)
PCP: Dr. Eppie Gibson HF Cardiology: Dr. Aundra Dubin  67 y.o. with COPD, mitral stenosis s/p bioprosthetic MV replacement, paroxysmal atrial fibrillation, GI bleeding, and pulmonary hypertension/RV failure presents for cardiology followup.  She was admitted with GI bleeding, thought to be AVMs, in 2/18 and warfarin was stopped.  She was admitted in 3/18 with chest pain, dyspnea, and elevated troponin.  She had not been taking her Lasix regularly.  Coronary angiography showed nonobstructive CAD, suspect demand ischemia. RHC showed elevated right and left heart filling pressures and severe pulmonary hypertension.  V/Q scan showed no chronic PE, and PFTs showed severe obstructive lung disease.  Echo showed severe RV dilation with mildly decreased systolic function.  She was diuresed and eventually discharged to [facility].   She was admitted in 2/19 with anemia, got 2 units PRBCs.    She returns for followup of diastolic CHF.  She has home oxygen.  She uses her CPAP.  Still smoking, failed Chantix in the past.  She still is short of breath with mild exertion such as walking around the house.  Weight is up 2 lbs since last appointment.  No chest pain.  No PND/orthopnea.  She is only taking Lasix 80 mg 1-2 times/week.   Labs (3/18): K 4.8 => 3.9, creatinine 1.2 => 1.09, hgb 10.2 => 10.7, BNP 131 Labs (5/18): K 4, creatinine 1.0 Labs (2/19): K 4.5, creatinine 0.94, transferrin saturation 3%.  Labs (3/19): hgb 11.3  PMH: 1. Mitral stenosis: s/p bioprosthetic mitral valve replacement.  2. COPD: Severe by 3/18 PFTs.  She is still smoking.  3. Atrial fibrillation: Paroxysmal. Has been off warfarin since GI bleed in 2/18.  4. OSA: Uses CPAP.  5. Type II diabetes. 6. Pulmonary hypertension/RV failure: Suspect primarily group 3 PH due to underlying lung disease (COPD).  - RHC (3/18) with mean RA 14, PA 77/28 mean 47, mean PCWP 23, CI 2.6, PVR 4.9.  - Echo (3/18): EF 60-65%, severe RV dilation with mildly decreased RV  systolic function, PASP 76 mmHg.  Bioprosthetic MV looks ok.  - V/Q scan (3/18): No evidence for chronic PE.  - PFTs (3/18): Severe obstructive airways disease.  7. Anemia: Fe deficiency and anemia of chronic disease.   - Suspect GI AVMs as cause of 2/18 bleed . Coumadin stopped.  8. CAD: LHC (3/18) with 30% LM, 40% proximal RCA.  9. Hyperlipidemia  SH: Lives alone in [place].  Active smoker.  No ETOH.   Family History  Problem Relation Age of Onset  . Peptic Ulcer Disease Father   . Heart attack Father 38       Died of MI at age 57  . Heart attack Brother 65       Died of MI at age 38  . Obesity Brother   . Pneumonia Mother   . Healthy Sister   . Lupus Daughter   . Obsessive Compulsive Disorder Daughter    ROS: All systems reviewed and negative except as per HPI.   Current Outpatient Medications  Medication Sig Dispense Refill  . albuterol (PROVENTIL HFA;VENTOLIN HFA) 108 (90 Base) MCG/ACT inhaler Inhale 2 puffs into the lungs every 6 (six) hours as needed for wheezing or shortness of breath. 2 Inhaler 5  . ALPRAZolam (XANAX) 1 MG tablet TAKE 1/2 TO 1 TABLET BY MOUTH TWICE DAILY AS NEEDED FOR ANXIETY 60 tablet 5  . BD ULTRA-FINE MICRO PEN NEEDLE 32G X 6 MM MISC USE TO INJECT INSULIN FID  3  . Blood Glucose  Monitoring Suppl (ONETOUCH VERIO) W/DEVICE KIT 1 each by Does not apply route 4 (four) times daily. 1 kit 0  . buPROPion (WELLBUTRIN) 75 MG tablet Take 1 tablet (75 mg total) by mouth 2 (two) times daily. 180 tablet 3  . chlorpheniramine (CHLOR-TRIMETON) 4 MG tablet Take 1 tablet (4 mg total) by mouth 3 (three) times daily as needed for allergies. 90 tablet 11  . clobetasol cream (TEMOVATE) 5.28 % Apply 1 application topically 2 (two) times daily.  0  . DULoxetine (CYMBALTA) 60 MG capsule Take 1 capsule (60 mg total) by mouth 2 (two) times daily. 60 capsule 11  . ferrous gluconate (FERGON) 324 MG tablet Take 1 tablet (324 mg total) by mouth 2 (two) times daily with a meal.  (Patient taking differently: Take 324 mg by mouth daily with breakfast. ) 180 tablet 3  . fluticasone (FLONASE) 50 MCG/ACT nasal spray Place 2 sprays into both nostrils daily. 16 g 1  . Fluticasone-Salmeterol (ADVAIR DISKUS) 500-50 MCG/DOSE AEPB Inhale 1 puff into the lungs 2 (two) times daily. 180 each 3  . furosemide (LASIX) 80 MG tablet Take 1 tablet (80 mg total) by mouth daily. TAKE 1 TABLET IN AM AND 1/2 TABLET AT 3PM 30 tablet 5  . gabapentin (NEURONTIN) 300 MG capsule Take 2 capsules (600 mg total) by mouth 3 (three) times daily. 540 capsule 3  . glucose blood (ONETOUCH VERIO) test strip 1 each by Other route QID. Use as instructed 350 each 11  . HYDROcodone-acetaminophen (NORCO/VICODIN) 5-325 MG tablet Take 1-2 tablets by mouth every 6 (six) hours as needed for severe pain. 120 tablet 0  . insulin aspart (NOVOLOG) 100 UNIT/ML FlexPen Inject 18-28 Units into the skin 3 (three) times daily with meals. (Patient taking differently: Inject 18 Units into the skin 4 (four) times daily. Pt states that she takes 4 times daily. Depending on how much she eats) 27 mL 10  . Insulin Detemir (LEVEMIR) 100 UNIT/ML Pen USE 37 UNITS TWICE DAILY 21 mL 11  . Insulin Syringe-Needle U-100 31G X 15/64" 0.5 ML MISC Use to inject insulin up to 4 times a day 400 each 3  . Lancets Misc. (ACCU-CHEK FASTCLIX LANCET) KIT Check your blood 4 times a day dx code 250.00 insulin requiring 1 kit 2  . metoprolol succinate (TOPROL-XL) 25 MG 24 hr tablet Take 1 tablet (25 mg total) by mouth daily. 30 tablet 3  . metoprolol succinate (TOPROL-XL) 25 MG 24 hr tablet TAKE 1 TABLET BY MOUTH DAILY 180 tablet 2  . nystatin cream (MYCOSTATIN) Apply 1 application topically 2 (two) times daily.  3  . omeprazole (PRILOSEC) 40 MG capsule Take 1 capsule (40 mg total) by mouth 2 (two) times daily. 180 capsule 3  . OXYGEN Inhale 2 L into the lungs continuous.    . potassium chloride SA (K-DUR,KLOR-CON) 20 MEQ tablet Take 1 tablet (20 mEq total)  by mouth 2 (two) times daily. 60 tablet 6  . promethazine (PHENERGAN) 12.5 MG tablet Take 1 tablet (12.5 mg total) by mouth every 8 (eight) hours as needed for nausea or vomiting. 30 tablet 0  . rosuvastatin (CRESTOR) 20 MG tablet Take 1 tablet (20 mg total) by mouth at bedtime. 90 tablet 3  . tiotropium (SPIRIVA HANDIHALER) 18 MCG inhalation capsule Place 1 capsule (18 mcg total) into inhaler and inhale daily. 90 capsule 3  . triamcinolone cream (KENALOG) 0.1 % Apply 1 application topically 2 (two) times daily.  3  . warfarin (COUMADIN)  2.5 MG tablet TAKE 1 TABLET BY MOUTH DAILY AT 6 PM EXCEPT ON MONDAYS 30 tablet 1   No current facility-administered medications for this encounter.    BP (!) 142/58   Pulse 76   Temp 98.3 F (36.8 C)   Wt 195 lb (88.5 kg)   SpO2 97% Comment: 2l [place]  BMI 35.67 kg/m  General: NAD Neck: JVP 8-9 cm, no thyromegaly or thyroid nodule.  Lungs: Distant BS. CV: Nondisplaced PMI.  Heart regular S1/S2, no S3/S4, no murmur.  1+ ankle edema.  No carotid bruit.  Normal pedal pulses.  Abdomen: Soft, nontender, no hepatosplenomegaly, no distention.  Skin: Intact without lesions or rashes.  Neurologic: Alert and oriented x 3.  Psych: Normal affect. Extremities: No clubbing or cyanosis.  HEENT: Normal.   Assessment/Plan: 1. Pulmonary hypertension: I suspect that this is primarily group 2 PH (pulmonary venous hypertension from LV diastolic dysfunction) and group 3 PH (severe COPD, OSA).  V/Q scan showed no evidence for chronic PE.  I do not think that she will benefit from selective pulmonary vasodilators.  I think that the key here will continue to be good diuresis + use of oxygen and CPAP.  2. Chronic diastolic CHF with prominent RV dysfunction in the setting of severe COPD.  She is volume overloaded on exam today with NYHA class IIIb symptoms (dyspnea is likely a combination of CHF and COPD) => dyspnea is chronic. She has not been taking Lasix as ordered.  - Restart  Lasix 80 mg daily.  BMET today and again in 10 days.  - Arrange for repeat echo.   3. Atrial fibrillation: Paroxysmal.  She is in NSR by exam today.  She had been off anticoagulation due to GI bleeding, now on warfarin with INR goal 1.5-2.5.  She had GI bleeding in 2/19, no recent BRBPR/melena.   - Keep warfarin with INR 1.5-2.5 for now.   - CBC today . - With most recent data regarding NOACs and bioprosthetic valves, probably would be ok to switch to apixaban.  Address at next appt.  4. h/o GI bleeding: Possible GI AVM. Admitted with anemia again in 2/19, got 2 units PRBCs. - Low Fe stores, will give IV Feraheme.   5. COPD: Severe by PFTs. Unfortunately, she is still smoking.  I encouraged her to quit.  6. CAD: Nonobstructive. She is on a statin.  - Check lipids today.   Followup NP/PA in 2 wks since restarting Lasix 80 mg daily.   Loralie Champagne 05/20/2017

## 2017-05-21 ENCOUNTER — Encounter (HOSPITAL_COMMUNITY): Payer: Self-pay

## 2017-05-21 ENCOUNTER — Encounter: Payer: Self-pay | Admitting: Pharmacist

## 2017-05-21 ENCOUNTER — Other Ambulatory Visit (HOSPITAL_COMMUNITY): Payer: Self-pay | Admitting: *Deleted

## 2017-05-21 ENCOUNTER — Other Ambulatory Visit: Payer: Self-pay | Admitting: Internal Medicine

## 2017-05-21 DIAGNOSIS — Z7901 Long term (current) use of anticoagulants: Secondary | ICD-10-CM

## 2017-05-21 DIAGNOSIS — J301 Allergic rhinitis due to pollen: Secondary | ICD-10-CM

## 2017-05-21 DIAGNOSIS — J449 Chronic obstructive pulmonary disease, unspecified: Secondary | ICD-10-CM | POA: Diagnosis not present

## 2017-05-21 DIAGNOSIS — I509 Heart failure, unspecified: Secondary | ICD-10-CM | POA: Diagnosis not present

## 2017-05-21 DIAGNOSIS — J439 Emphysema, unspecified: Secondary | ICD-10-CM | POA: Diagnosis not present

## 2017-05-21 DIAGNOSIS — I480 Paroxysmal atrial fibrillation: Secondary | ICD-10-CM

## 2017-05-21 DIAGNOSIS — G4733 Obstructive sleep apnea (adult) (pediatric): Secondary | ICD-10-CM | POA: Diagnosis not present

## 2017-05-21 MED ORDER — FUROSEMIDE 80 MG PO TABS: 80.0000 mg | ORAL_TABLET | Freq: Every day | ORAL | 5 refills | Status: DC

## 2017-05-21 NOTE — Progress Notes (Signed)
INR therapeutic, 1.6 (goal 1.5-2.5). Indication: atrial fibrillation (also history of GI bleed with AVM). Patient reports no signs or symptoms of bleeding or thromboembolism. No change in warfarin dosing today.  Attending physician: Lenice Pressman  Follow up appointment scheduled for 07/03/17.

## 2017-05-21 NOTE — Progress Notes (Signed)
INTERNAL MEDICINE TEACHING ATTENDING ADDENDUM  I agree with pharmacy recommendations as outlined in their note.   Alexander N Raines, MD   

## 2017-05-21 NOTE — Patient Instructions (Signed)
Patient was educated about medication as defined in this encounter and verbalized understanding by repeating back instructions provided.

## 2017-05-22 NOTE — Telephone Encounter (Signed)
User: Cherie Dark A Date/time: 05/19/17 3:09 PM  Comment: Called pt and lmsg for her to CB to get scheduled for an echo.Vassie Moment  Context:  Outcome: Left Message  Phone number: [phone] Phone Type: Home Phone  Comm. type: Telephone Call type: Outgoing  Contact: Erik Obey A Relation to patient: Self

## 2017-05-26 ENCOUNTER — Other Ambulatory Visit (HOSPITAL_COMMUNITY): Payer: PPO

## 2017-05-26 ENCOUNTER — Other Ambulatory Visit (HOSPITAL_COMMUNITY): Payer: Self-pay

## 2017-05-26 DIAGNOSIS — E611 Iron deficiency: Secondary | ICD-10-CM

## 2017-05-27 ENCOUNTER — Ambulatory Visit (HOSPITAL_COMMUNITY)
Admission: RE | Admit: 2017-05-27 | Discharge: 2017-05-27 | Disposition: A | Discharge status: Home / Self Care | Payer: PPO | Source: Ambulatory Visit | Attending: Cardiology | Admitting: Cardiology

## 2017-05-27 DIAGNOSIS — E611 Iron deficiency: Secondary | ICD-10-CM | POA: Insufficient documentation

## 2017-05-27 MED ORDER — SODIUM CHLORIDE 0.9 % IV SOLN
510.0000 mg | INTRAVENOUS | Status: DC
  Administered 2017-05-27: 510 mg via INTRAVENOUS
  Filled 2017-05-27: qty 17

## 2017-06-02 ENCOUNTER — Other Ambulatory Visit (HOSPITAL_COMMUNITY): Payer: Self-pay | Admitting: *Deleted

## 2017-06-03 ENCOUNTER — Encounter (HOSPITAL_COMMUNITY)
Admission: RE | Admit: 2017-06-03 | Discharge: 2017-06-03 | Disposition: A | Discharge status: Home / Self Care | Payer: PPO | Source: Ambulatory Visit | Attending: Cardiology | Admitting: Cardiology

## 2017-06-03 DIAGNOSIS — E611 Iron deficiency: Secondary | ICD-10-CM | POA: Diagnosis not present

## 2017-06-03 MED ORDER — SODIUM CHLORIDE 0.9 % IV SOLN
510.0000 mg | INTRAVENOUS | Status: DC
  Administered 2017-06-03: 510 mg via INTRAVENOUS
  Filled 2017-06-03: qty 17

## 2017-06-04 ENCOUNTER — Encounter (HOSPITAL_COMMUNITY): Payer: PPO

## 2017-06-04 ENCOUNTER — Encounter (HOSPITAL_COMMUNITY): Payer: Self-pay

## 2017-06-04 ENCOUNTER — Ambulatory Visit (HOSPITAL_COMMUNITY)
Admission: RE | Admit: 2017-06-04 | Discharge: 2017-06-04 | Disposition: A | Discharge status: Home / Self Care | Payer: PPO | Source: Ambulatory Visit | Attending: Internal Medicine | Admitting: Internal Medicine

## 2017-06-04 VITALS — BP 140/72 | HR 75 | Wt 194.8 lb

## 2017-06-04 DIAGNOSIS — I2722 Pulmonary hypertension due to left heart disease: Secondary | ICD-10-CM

## 2017-06-04 DIAGNOSIS — F17200 Nicotine dependence, unspecified, uncomplicated: Secondary | ICD-10-CM

## 2017-06-04 DIAGNOSIS — I272 Pulmonary hypertension, unspecified: Secondary | ICD-10-CM | POA: Insufficient documentation

## 2017-06-04 DIAGNOSIS — J449 Chronic obstructive pulmonary disease, unspecified: Secondary | ICD-10-CM

## 2017-06-04 DIAGNOSIS — I2510 Atherosclerotic heart disease of native coronary artery without angina pectoris: Secondary | ICD-10-CM | POA: Insufficient documentation

## 2017-06-04 DIAGNOSIS — D649 Anemia, unspecified: Secondary | ICD-10-CM | POA: Diagnosis not present

## 2017-06-04 DIAGNOSIS — G4733 Obstructive sleep apnea (adult) (pediatric): Secondary | ICD-10-CM | POA: Diagnosis not present

## 2017-06-04 DIAGNOSIS — I5032 Chronic diastolic (congestive) heart failure: Secondary | ICD-10-CM | POA: Diagnosis not present

## 2017-06-04 DIAGNOSIS — Z794 Long term (current) use of insulin: Secondary | ICD-10-CM | POA: Diagnosis not present

## 2017-06-04 DIAGNOSIS — Z79899 Other long term (current) drug therapy: Secondary | ICD-10-CM | POA: Insufficient documentation

## 2017-06-04 DIAGNOSIS — E119 Type 2 diabetes mellitus without complications: Secondary | ICD-10-CM | POA: Insufficient documentation

## 2017-06-04 DIAGNOSIS — I480 Paroxysmal atrial fibrillation: Secondary | ICD-10-CM

## 2017-06-04 DIAGNOSIS — E785 Hyperlipidemia, unspecified: Secondary | ICD-10-CM | POA: Insufficient documentation

## 2017-06-04 DIAGNOSIS — K922 Gastrointestinal hemorrhage, unspecified: Secondary | ICD-10-CM | POA: Diagnosis not present

## 2017-06-04 LAB — BASIC METABOLIC PANEL
Anion gap: 9 (ref 5–15)
BUN: 14 mg/dL (ref 6–20)
CO2: 28 mmol/L (ref 22–32)
Calcium: 9.4 mg/dL (ref 8.9–10.3)
Chloride: 101 mmol/L (ref 101–111)
Creatinine, Ser: 1 mg/dL (ref 0.44–1.00)
GFR calc Af Amer: >60 mL/min (ref >60)
GFR calc non Af Amer: 57 mL/min — ABNORMAL LOW (ref >60)
Glucose, Bld: 107 mg/dL — ABNORMAL HIGH (ref 65–99)
Potassium: 4.1 mmol/L (ref 3.5–5.1)
Sodium: 138 mmol/L (ref 135–145)

## 2017-06-04 NOTE — Progress Notes (Signed)
PCP: Dr. Eppie Gibson HF Cardiology: Dr. Aundra Dubin  67 y.o. with COPD, mitral stenosis s/p bioprosthetic MV replacement, paroxysmal atrial fibrillation, GI bleeding, and pulmonary hypertension/RV failure presents for cardiology followup.  [name] was admitted with GI bleeding, thought to be AVMs, in 2/18 and warfarin was stopped.  [name] was admitted in 3/18 with chest pain, dyspnea, and elevated troponin.  [name] had not been taking [name] Lasix regularly.  Coronary angiography showed nonobstructive CAD, suspect demand ischemia. RHC showed elevated right and left heart filling pressures and severe pulmonary hypertension.  V/Q scan showed no chronic PE, and PFTs showed severe obstructive lung disease.  Echo showed severe RV dilation with mildly decreased systolic function.  [name] was diuresed and eventually discharged to [facility].   [name] was admitted in 2/19 with anemia, got 2 units PRBCs.    Today [name] returns for HF follow up. Overall feeling fair. Spends most of [name] time sitting in a chair. [name] does not use oxygen in the house. Continue to use 2 liters oxygen outside of the house. Ongoing SOB/Fatigue. Denies PND/Orthopnea. Appetite ok. No fever or chills. Weight at home 193-195 . Needs assistance with transportation.  Taking all medications. Smoking 1PPD.    Labs (3/18): K 4.8 => 3.9, creatinine 1.2 => 1.09, hgb 10.2 => 10.7, BNP 131 Labs (5/18): K 4, creatinine 1.0 Labs (2/19): K 4.5, creatinine 0.94, transferrin saturation 3%.  Labs (3/19): hgb 11.3  PMH: 1. Mitral stenosis: s/p bioprosthetic mitral valve replacement.  2. COPD: Severe by 3/18 PFTs.  [name] is still smoking.  3. Atrial fibrillation: Paroxysmal. Has been off warfarin since GI bleed in 2/18.  4. OSA: Uses CPAP.  5. Type II diabetes. 6. Pulmonary hypertension/RV failure: Suspect primarily group 3 PH due to underlying lung disease (COPD).  - RHC (3/18) with mean RA 14, PA 77/28 mean 47, mean PCWP 23, CI 2.6, PVR 4.9.  - Echo (3/18): EF 60-65%, severe  RV dilation with mildly decreased RV systolic function, PASP 76 mmHg.  Bioprosthetic MV looks ok.  - V/Q scan (3/18): No evidence for chronic PE.  - PFTs (3/18): Severe obstructive airways disease.  7. Anemia: Fe deficiency and anemia of chronic disease.   - Suspect GI AVMs as cause of 2/18 bleed . Coumadin stopped.  8. CAD: LHC (3/18) with 30% LM, 40% proximal RCA.  9. Hyperlipidemia  SH: Lives alone in [place].  Active smoker.  No ETOH.   Family History  Problem Relation Age of Onset  . Peptic Ulcer Disease Father   . Heart attack Father 80       Died of MI at age 17  . Heart attack Brother 55       Died of MI at age 10  . Obesity Brother   . Pneumonia Mother   . Healthy Sister   . Lupus Daughter   . Obsessive Compulsive Disorder Daughter    ROS: All systems reviewed and negative except as per HPI.   Current Outpatient Medications  Medication Sig Dispense Refill  . albuterol (PROVENTIL HFA;VENTOLIN HFA) 108 (90 Base) MCG/ACT inhaler Inhale 2 puffs into the lungs every 6 (six) hours as needed for wheezing or shortness of breath. 2 Inhaler 5  . ALPRAZolam (XANAX) 1 MG tablet TAKE 1/2 TO 1 TABLET BY MOUTH TWICE DAILY AS NEEDED FOR ANXIETY 60 tablet 5  . BD ULTRA-FINE MICRO PEN NEEDLE 32G X 6 MM MISC USE TO INJECT INSULIN FID  3  . Blood Glucose Monitoring Suppl (ONETOUCH VERIO)  W/DEVICE KIT 1 each by Does not apply route 4 (four) times daily. 1 kit 0  . buPROPion (WELLBUTRIN) 75 MG tablet Take 1 tablet (75 mg total) by mouth 2 (two) times daily. 180 tablet 3  . chlorpheniramine (CHLOR-TRIMETON) 4 MG tablet Take 1 tablet (4 mg total) by mouth 3 (three) times daily as needed for allergies. 90 tablet 11  . clobetasol cream (TEMOVATE) 4.09 % Apply 1 application topically 2 (two) times daily.  0  . DULoxetine (CYMBALTA) 60 MG capsule Take 1 capsule (60 mg total) by mouth 2 (two) times daily. 60 capsule 11  . ferrous gluconate (FERGON) 324 MG tablet Take 1 tablet (324 mg total) by  mouth 2 (two) times daily with a meal. (Patient taking differently: Take 324 mg by mouth daily with breakfast. ) 180 tablet 3  . fluticasone (FLONASE) 50 MCG/ACT nasal spray Place 2 sprays into both nostrils daily. 16 g 3  . Fluticasone-Salmeterol (ADVAIR DISKUS) 500-50 MCG/DOSE AEPB Inhale 1 puff into the lungs 2 (two) times daily. 180 each 3  . furosemide (LASIX) 80 MG tablet Take 1 tablet (80 mg total) by mouth daily. TAKE 1 TABLET IN AM AND 1/2 TABLET AT 3PM 45 tablet 5  . gabapentin (NEURONTIN) 300 MG capsule Take 2 capsules (600 mg total) by mouth 3 (three) times daily. 540 capsule 3  . glucose blood (ONETOUCH VERIO) test strip 1 each by Other route QID. Use as instructed 350 each 11  . HYDROcodone-acetaminophen (NORCO/VICODIN) 5-325 MG tablet Take 1-2 tablets by mouth every 6 (six) hours as needed for severe pain. 120 tablet 0  . insulin aspart (NOVOLOG) 100 UNIT/ML FlexPen Inject 18-28 Units into the skin 3 (three) times daily with meals. (Patient taking differently: Inject 18 Units into the skin 4 (four) times daily. Pt states that [name] takes 4 times daily. Depending on how much [name] eats) 27 mL 10  . Insulin Detemir (LEVEMIR) 100 UNIT/ML Pen USE 37 UNITS TWICE DAILY 21 mL 11  . Insulin Syringe-Needle U-100 31G X 15/64" 0.5 ML MISC Use to inject insulin up to 4 times a day 400 each 3  . Lancets Misc. (ACCU-CHEK FASTCLIX LANCET) KIT Check your blood 4 times a day dx code 250.00 insulin requiring 1 kit 2  . metoprolol succinate (TOPROL-XL) 25 MG 24 hr tablet Take 1 tablet (25 mg total) by mouth daily. 30 tablet 3  . metoprolol succinate (TOPROL-XL) 25 MG 24 hr tablet TAKE 1 TABLET BY MOUTH DAILY 180 tablet 2  . nystatin cream (MYCOSTATIN) Apply 1 application topically 2 (two) times daily.  3  . omeprazole (PRILOSEC) 40 MG capsule Take 1 capsule (40 mg total) by mouth 2 (two) times daily. 180 capsule 3  . OXYGEN Inhale 2 L into the lungs continuous.    . potassium chloride SA (K-DUR,KLOR-CON) 20  MEQ tablet Take 1 tablet (20 mEq total) by mouth 2 (two) times daily. 60 tablet 6  . promethazine (PHENERGAN) 12.5 MG tablet Take 1 tablet (12.5 mg total) by mouth every 8 (eight) hours as needed for nausea or vomiting. 30 tablet 0  . rosuvastatin (CRESTOR) 20 MG tablet Take 1 tablet (20 mg total) by mouth at bedtime. 90 tablet 3  . tiotropium (SPIRIVA HANDIHALER) 18 MCG inhalation capsule Place 1 capsule (18 mcg total) into inhaler and inhale daily. 90 capsule 3  . triamcinolone cream (KENALOG) 0.1 % Apply 1 application topically 2 (two) times daily.  3  . warfarin (COUMADIN) 2.5 MG tablet TAKE  1 TABLET BY MOUTH DAILY AT 6 PM EXCEPT ON MONDAYS 30 tablet 1   No current facility-administered medications for this encounter.    BP 140/72   Pulse 75   Wt 194 lb 12.8 oz (88.4 kg)   SpO2 95% Comment: 2L  BMI 35.63 kg/m    Wt Readings from Last 3 Encounters:  06/04/17 194 lb 12.8 oz (88.4 kg)  05/27/17 185 lb (83.9 kg)  05/19/17 195 lb (88.5 kg)  General: Arrived in a wheel chair. No resp difficulty HEENT: normal Neck: supple. Difficult to assess due body habitus.  Carotids 2+ bilat; no bruits. No lymphadenopathy or thryomegaly appreciated. Cor: PMI nondisplaced. Regular rate & rhythm. No rubs, gallops or murmurs. Lungs: clear on 1 liter oxygen.  Abdomen: soft, nontender, nondistended. No hepatosplenomegaly. No bruits or masses. Good bowel sounds. Extremities: no cyanosis, clubbing, rash, R and LLE trace edema Neuro: alert & orientedx3, cranial nerves grossly intact. moves all 4 extremities w/o difficulty. Affect pleasant    Assessment/Plan: 1. Pulmonary hypertension: I suspect that this is primarily group 2 PH (pulmonary venous hypertension from LV diastolic dysfunction) and group 3 PH (severe COPD, OSA).  V/Q scan showed no evidence for chronic PE. [name] was not thought to  benefit from selective pulmonary vasodilators.  Encouraged to use oxygen continually.   2. Chronic diastolic CHF with  prominent RV dysfunction in the setting of severe COPD.  [name] is volume overloaded on exam today with NYHA class IIIb symptoms (dyspnea is likely a combination of CHF and COPD) => dyspnea is chronic.  NYHA III. Volume status stable. Continue lasix 80 mg daily. Check BMET today. .  - Arrange for repeat echo.  Check BMET today.  3. Atrial fibrillation: Paroxysmal.  [name] is in NSR by exam today.  [name] had been off anticoagulation due to GI bleeding, now on warfarin with INR goal 1.5-2.5.  [name] had GI bleeding in 2/19, no recent BRBPR/melena.  Regular pulse.  - No bleeding problems.  - Keep warfarin with INR 1.5-2.5 for now.   - With most recent data regarding NOACs and bioprosthetic valves, probably would be ok to switch to apixaban.  [name] does not want to switch to NOAC. 4. h/o GI bleeding: Possible GI AVM. Admitted with anemia again in 2/19, got 2 units PRBCs. - 5. COPD: Severe by PFTs. Unfortunately, [name] is still smoking.  I encouraged [name] to quit.  6. CAD: Nonobstructive. [name] is on a statin.  7. Smoker: discussed smoking cessation.   Follow up with Dr Aundra Dubin in 2 months.      Deion Swift NP-C  06/04/2017

## 2017-06-04 NOTE — Patient Instructions (Signed)
Routine lab work today. Will notify you of abnormal results, otherwise no news is good news!  No changes to medication at this time.  Follow up 8 weeks with Dr. Aundra Dubin.  ____________________________________________________________ [name] Code:  Take all medication as prescribed the day of your appointment. Bring all medications with you to your appointment.  Do the following things EVERYDAY: 1) Weigh yourself in the morning before breakfast. Write it down and keep it in a log. 2) Take your medicines as prescribed 3) Eat low salt foods-Limit salt (sodium) to 2000 mg per day.  4) Stay as active as you can everyday 5) Limit all fluids for the day to less than 2 liters

## 2017-06-10 ENCOUNTER — Ambulatory Visit (HOSPITAL_COMMUNITY): Payer: PPO | Attending: Cardiology

## 2017-06-10 ENCOUNTER — Other Ambulatory Visit: Payer: Self-pay

## 2017-06-10 DIAGNOSIS — I2722 Pulmonary hypertension due to left heart disease: Secondary | ICD-10-CM | POA: Insufficient documentation

## 2017-06-10 DIAGNOSIS — Z952 Presence of prosthetic heart valve: Secondary | ICD-10-CM | POA: Diagnosis not present

## 2017-06-18 ENCOUNTER — Other Ambulatory Visit: Payer: Self-pay | Admitting: Internal Medicine

## 2017-06-18 DIAGNOSIS — E1140 Type 2 diabetes mellitus with diabetic neuropathy, unspecified: Secondary | ICD-10-CM

## 2017-06-18 DIAGNOSIS — Z794 Long term (current) use of insulin: Secondary | ICD-10-CM

## 2017-06-18 DIAGNOSIS — F419 Anxiety disorder, unspecified: Secondary | ICD-10-CM

## 2017-06-18 DIAGNOSIS — K449 Diaphragmatic hernia without obstruction or gangrene: Principal | ICD-10-CM

## 2017-06-18 DIAGNOSIS — K219 Gastro-esophageal reflux disease without esophagitis: Secondary | ICD-10-CM

## 2017-06-19 NOTE — Telephone Encounter (Signed)
Xanax rx called to [facility] pharmacy.

## 2017-06-20 DIAGNOSIS — J449 Chronic obstructive pulmonary disease, unspecified: Secondary | ICD-10-CM | POA: Diagnosis not present

## 2017-06-20 DIAGNOSIS — I509 Heart failure, unspecified: Secondary | ICD-10-CM | POA: Diagnosis not present

## 2017-06-20 DIAGNOSIS — J439 Emphysema, unspecified: Secondary | ICD-10-CM | POA: Diagnosis not present

## 2017-06-20 DIAGNOSIS — G4733 Obstructive sleep apnea (adult) (pediatric): Secondary | ICD-10-CM | POA: Diagnosis not present

## 2017-06-25 ENCOUNTER — Ambulatory Visit
Admission: RE | Admit: 2017-06-25 | Discharge: 2017-06-25 | Disposition: A | Discharge status: Home / Self Care | Payer: PPO | Source: Ambulatory Visit | Attending: Interventional Radiology | Admitting: Interventional Radiology

## 2017-06-25 ENCOUNTER — Encounter: Payer: Self-pay | Admitting: Radiology

## 2017-06-25 ENCOUNTER — Ambulatory Visit (HOSPITAL_COMMUNITY)
Admission: RE | Admit: 2017-06-25 | Discharge: 2017-06-25 | Disposition: A | Discharge status: Home / Self Care | Payer: PPO | Source: Ambulatory Visit | Attending: Interventional Radiology | Admitting: Interventional Radiology

## 2017-06-25 DIAGNOSIS — N281 Cyst of kidney, acquired: Secondary | ICD-10-CM | POA: Diagnosis not present

## 2017-06-25 DIAGNOSIS — C642 Malignant neoplasm of left kidney, except renal pelvis: Secondary | ICD-10-CM | POA: Insufficient documentation

## 2017-06-25 DIAGNOSIS — R52 Pain, unspecified: Secondary | ICD-10-CM

## 2017-06-25 DIAGNOSIS — N2889 Other specified disorders of kidney and ureter: Secondary | ICD-10-CM | POA: Diagnosis not present

## 2017-06-25 HISTORY — PX: IR RADIOLOGIST EVAL & MGMT: IMG5224

## 2017-06-25 MED ORDER — GADOBENATE DIMEGLUMINE 529 MG/ML IV SOLN
20.0000 mL | Freq: Once | INTRAVENOUS | Status: AC | PRN
  Administered 2017-06-25: 18 mL via INTRAVENOUS

## 2017-06-25 NOTE — Progress Notes (Signed)
Chief Complaint: Status post cryoablation of a left clear-cell renal carcinoma on 09/19/2011.  History of Present Illness: [name] is a 67 y.o. female now more than 5 years post cryoablation of a left lower pole clear-cell renal carcinoma without evidence of recurrence.  Decision was made to perform additional MRI follow-up due to a potential small new renal lesion in the lateral upper pole of the left kidney measuring roughly 7 mm.  She was hospitalized in February for symptomatic anemia.  Her anemia has improved after IV iron.  She has no urinary complaints.  Past Medical History:  Diagnosis Date  . Adenomatous polyps 05/14/2011   Colonoscopy (05/2011): 4 mm adenomatous polyp excised endoscopically Colonoscopy (02/2002): Adenomatous polyp excised endoscopically   . Allergic rhinitis 06/01/2012  . Anemia of chronic disease 01/01/2013  . Anxiety 07/24/2010  . Aortic atherosclerosis ([place]) 10/19/2014   Seen on CT scan, currently asymptomatic  . 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 Asymptomatic cholelithiasis 09/25/2015   Seen on CT scan 08/2015  . Carotid artery stenosis    s/p right endarterectomy (06/2010) Carotid US (07/2010):  Left: Moderate-to-severe (60-79%) calcific and non-calcific plaque origin and proximal ICA and ECA   . Chronic congestive heart failure with left ventricular diastolic dysfunction ([place]) 10/21/2010  . Chronic constipation 02/03/2011  . Chronic daily headache 01/16/2014  . Chronic low back pain 10/06/2012  . Chronic venous insufficiency 08/04/2012  . Clear cell renal cell carcinoma ([place]) 07/21/2011   s/p cryoablation of left RCC in 09/2011 by [name]. Followed by [name]  [facility]) .    Marland Kitchen COPD (chronic obstructive pulmonary disease) with emphysema (HCC)    PFTs 02/2012: FEV1 0.92 (40%), ratio 69, 27% increase in FEV1 with BD, TLC 91%, severe airtrapping,  DLCO49% On chronic home O2. Pulmonary rehab referral 05/2012   . Depression 11/19/2005  . Esophageal stenosis 08/08/2015   Mild, benign-appearing on EGD 06/29/2015  . Fibromyalgia 08/29/2010  . Gastroesophageal reflux disease   . History of blood transfusion    "several times"   . Hyperlipidemia LDL goal < 100 11/20/2005  . Internal hemorrhoids 08/04/2012  . Lichen sclerosus of female genitalia 01/12/2017  . Mitral stenosis    s/p Mitral valve replacement with a 27-mm pericardial porcine valve (Medtronic Mosaic valve, serial #[id number] on 09/20/10, Dr. Prescott Gum)   . Moderate to severe pulmonary hypertension ([place])    2014 TEE w PA peak pressure 46 mmHg, s/p MV replacement   . Moderately severe major depression ([place]) 11/19/2005  . Obesity (BMI 30.0-34.9) 10/23/2011  . Obstructive sleep apnea    Nocturnal polysomnography (06/2009): Moderate sleep apnea/ hypopnea syndrome , AHI 17.8 per hour with nonpositional hypopneas. CPAP titration to 12 CWP, AHI 2.4 per hour. On nocturnal CPAP via a small resMed Quattro full-face mask with heated humidifier.   . On home oxygen therapy    "2.5L; anytime I sleep; day or night" (03/26/2017)  . OSA on CPAP   . Osteoporosis    DEXA (12/09/2011): L-spine T -3.7, left hip T -1.4 DEXA (12/2004): L-spine T -2.6, left hip -0.1   . Paroxysmal atrial fibrillation ([place]) 10/22/2010   s/p Left atrial maze procedure for paroxysmal atrial fibrillation on 09/20/2010 by Dr Prescott Gum.  Subsequent splenic infarct, decision was made to re-anticoagulate with coumadin, likely life-long as this is the most likely cause of the splenic infarct.   . Pulmonary hypertension due  to chronic obstructive pulmonary disease ([place]) 04/25/2016  . Right nephrolithiasis 09/06/2014   5 mm non-obstructing calculus seen on CT scan 09/05/2014   . Seborrheic keratosis 09/28/2015  . Severe obesity (BMI 35.0-39.9) with comorbidity ([place]) 10/23/2011  . Shortness of breath dyspnea   . Tobacco abuse 07/28/2012  .  Type 2 diabetes mellitus with diabetic neuropathy [facility])     Past Surgical History:  Procedure Laterality Date  . CARDIAC CATHETERIZATION    . CARDIAC VALVE REPLACEMENT  Aug. 2012   "mitral valve"  . CAROTID ENDARTERECTOMY Right 07/04/2010   by [name] for asymptomatic right carotid artery stenosis  . CATARACT EXTRACTION W/ INTRAOCULAR LENS  IMPLANT, BILATERAL Bilateral   . CHEST TUBE INSERTION  09/24/2010   Dr Prescott Gum  . COLONOSCOPY  05/12/2011   performed by Dr. Michail Sermon. Showing small internal hemorrhoids, single tubular adenoma polyp  . CRYOABLATION Left 09/2011   by [name]. Followed by [name]  [facility]) .    Marland Kitchen DILATION AND CURETTAGE OF UTERUS    . ESOPHAGOGASTRODUODENOSCOPY  05/12/2011   performed by Dr. Michail Sermon. Negative for ulcerations, biopsy negative for evidence of celiac sprue  . ESOPHAGOGASTRODUODENOSCOPY N/A 06/29/2015   Procedure: ESOPHAGOGASTRODUODENOSCOPY (EGD);  Surgeon: Clarene Essex, MD;  Location: [facility] ENDOSCOPY;  Service: Endoscopy;  Laterality: N/A;  . ESOPHAGOGASTRODUODENOSCOPY N/A 03/29/2016   Procedure: ESOPHAGOGASTRODUODENOSCOPY (EGD);  Surgeon: Clarene Essex, MD;  Location[facility] ENDOSCOPY;  Service: Endoscopy;  Laterality: N/A;  . GIVENS CAPSULE STUDY N/A 06/30/2015   Procedure: GIVENS CAPSULE STUDY;  Surgeon: Clarene Essex, MD;  Location: [place];  Service: Endoscopy;  Laterality: N/A;  . GIVENS CAPSULE STUDY N/A 06/29/2015   Procedure: GIVENS CAPSULE STUDY;  Surgeon: Clarene Essex, MD;  Location: [place];  Service: Endoscopy;  Laterality: N/A;  . HEMORRHOID SURGERY  1970s?   "lanced"  . Hysteroscopy with endometrial ablation  06/2001   for persistent post-menopausal bleeding // by S. [name], M.D.  . IR GENERIC HISTORICAL  08/23/2015   IR RADIOLOGIST EVAL & MGMT 08/23/2015 Aletta Edouard, MD [facility] INTERV RAD  . IR GENERIC HISTORICAL  04/09/2016   IR RADIOLOGIST EVAL & MGMT 04/09/2016 Aletta Edouard, MD [facility] INTERV RAD  . IR RADIOLOGIST  EVAL & MGMT  10/07/2016  . IR RADIOLOGIST EVAL & MGMT  06/25/2017  . LEFT HEART CATH AND CORONARY ANGIOGRAPHY N/A 04/21/2016   Procedure: Left Heart Cath and Coronary Angiography;  Surgeon: Lorretta Harp, MD;  Location: [place] CV LAB;  Service: Cardiovascular;  Laterality: N/A;  . LIPOMA EXCISION  08/2005   occipital lipoma 1.5cm - by Dr. [name]  . MAZE Left 09/20/10   for paroxysmal atrial fibrillation (Dr. Prescott Gum)  . MITRAL VALVE REPLACEMENT  09/20/10    with a 27-mm pericardial porcine valve (Medtronic Mosaic valve, serial #[id number]). 09/20/10, Dr Prescott Gum  . ORIF CLAVICLE FRACTURE  01/2004   by Thana Farr. [name], M.D for Right clavicle nonunion.; "it's got a pin in it"  . REFRACTIVE SURGERY Bilateral   . RIGHT HEART CATH N/A 04/23/2016   Procedure: Right Heart Cath;  Surgeon: [name], MD;  Location: [place] CV LAB;  Service: Cardiovascular;  Laterality: N/A;  . TONSILLECTOMY    . TUBAL LIGATION      Allergies: Lorazepam; Morphine and related; Oxycontin [oxycodone]; and Tramadol hcl  Medications: Prior to Admission medications   Medication Sig Start Date End Date Taking? Authorizing Provider  albuterol (PROVENTIL HFA;VENTOLIN HFA) 108 (90 Base) MCG/ACT inhaler  Inhale 2 puffs into the lungs every 6 (six) hours as needed for wheezing or shortness of breath. 12/03/16  Yes [name], MD  ALPRAZolam [name]) 1 MG tablet Take 0.5-1 tablets (0.5-1 mg total) by mouth 2 (two) times daily as needed for anxiety. 06/19/17  Yes [name], MD  BD ULTRA-FINE MICRO PEN NEEDLE 32G X 6 MM MISC USE TO INJECT INSULIN FID 11/19/16  Yes [provider]  Blood Glucose Monitoring Suppl (ONETOUCH VERIO) W/DEVICE KIT 1 each by Does not apply route 4 (four) times daily. 07/25/14  Yes [name], MD  buPROPion (WELLBUTRIN) 75 MG tablet Take 1 tablet (75 mg total) by mouth 2 (two) times daily. 12/05/16  Yes [name], MD  chlorpheniramine (CHLOR-TRIMETON) 4 MG tablet  Take 1 tablet (4 mg total) by mouth 3 (three) times daily as needed for allergies. 10/06/14  Yes [name], MD  clobetasol cream (TEMOVATE) 1.61 % Apply 1 application topically 2 (two) times daily. 01/07/17  Yes [provider]  DULoxetine (CYMBALTA) 60 MG capsule Take 1 capsule (60 mg total) by mouth 2 (two) times daily. 01/12/17  Yes [name], MD  ferrous gluconate (FERGON) 324 MG tablet Take 1 tablet (324 mg total) by mouth 2 (two) times daily with a meal. Patient taking differently: Take 324 mg by mouth daily with breakfast.  12/05/16  Yes [name], MD  fluticasone (FLONASE) 50 MCG/ACT nasal spray Place 2 sprays into both nostrils daily. 05/22/17  Yes [name], MD  Fluticasone-Salmeterol (ADVAIR DISKUS) 500-50 MCG/DOSE AEPB Inhale 1 puff into the lungs 2 (two) times daily. 11/06/16  Yes [name], MD  furosemide (LASIX) 80 MG tablet Take 1 tablet (80 mg total) by mouth daily. TAKE 1 TABLET IN AM AND 1/2 TABLET AT 3PM 05/21/17  Yes [name], MD  gabapentin (NEURONTIN) 300 MG capsule Take 2 capsules (600 mg total) by mouth 3 (three) times daily. 10/09/16  Yes [name], MD  glucose blood (ONETOUCH VERIO) test strip 1 each by Other route QID. Use as instructed 11/12/16  Yes [name], MD  HYDROcodone-acetaminophen (NORCO/VICODIN) 5-325 MG tablet Take 1-2 tablets by mouth every 6 (six) hours as needed for severe pain. 12/05/16  Yes [name], MD  insulin aspart (NOVOLOG) 100 UNIT/ML FlexPen Inject 18-28 Units into the skin 3 (three) times daily with meals. Patient taking differently: Inject 18 Units into the skin 4 (four) times daily. Pt states that she takes 4 times daily. Depending on how much she eats 10/16/16  Yes [name], MD  Insulin Detemir (LEVEMIR FLEXTOUCH) 100 UNIT/ML Pen Inject 37 Units into the skin 2 (two) times daily. 06/19/17  Yes [name], MD  Insulin Syringe-Needle U-100 31G X 15/64" 0.5 ML MISC Use to inject  insulin up to 4 times a day 04/17/16  Yes [name], MD  Lancets Misc. (ACCU-CHEK FASTCLIX LANCET) KIT Check your blood 4 times a day dx code 250.00 insulin requiring 01/19/13  Yes [name], MD  metoprolol succinate (TOPROL-XL) 25 MG 24 hr tablet Take 1 tablet (25 mg total) by mouth daily. 06/11/16  Yes [name], MD  metoprolol succinate (TOPROL-XL) 25 MG 24 hr tablet TAKE 1 TABLET BY MOUTH DAILY 04/30/17  Yes [name], MD  nystatin cream (MYCOSTATIN) Apply 1 application topically 2 (two) times daily. 12/31/16  Yes [provider]  omeprazole (PRILOSEC) 40 MG capsule Take 1 capsule (40 mg total) by mouth 2 (two) times daily. 06/19/17  Yes [name],  MD  OXYGEN Inhale 2 L into the lungs continuous.   Yes [provider]  potassium chloride SA (K-DUR,KLOR-CON) 20 MEQ tablet Take 1 tablet (20 mEq total) by mouth 2 (two) times daily. 08/25/16  Yes [name], MD  promethazine (PHENERGAN) 12.5 MG tablet Take 1 tablet (12.5 mg total) by mouth every 8 (eight) hours as needed for nausea or vomiting. 07/13/15  Yes [name], DO  rosuvastatin (CRESTOR) 20 MG tablet Take 1 tablet (20 mg total) by mouth at bedtime. 08/14/16  Yes [name], MD  tiotropium (SPIRIVA HANDIHALER) 18 MCG inhalation capsule Place 1 capsule (18 mcg total) into inhaler and inhale daily. 08/14/16  Yes [name], MD  triamcinolone cream (KENALOG) 0.1 % Apply 1 application topically 2 (two) times daily. 12/31/16  Yes [provider]  warfarin (COUMADIN) 2.5 MG tablet TAKE 1 TABLET BY MOUTH DAILY AT 6 PM EXCEPT ON MONDAYS 05/22/17  Yes [name], MD     Family History  Problem Relation Age of Onset  . Peptic Ulcer Disease Father   . Heart attack Father 45       Died of MI at age 44  . Heart attack Brother 52       Died of MI at age 43  . Obesity Brother   . Pneumonia Mother   . Healthy Sister   . Lupus Daughter   . Obsessive Compulsive Disorder Daughter      Social History   Socioeconomic History  . Marital status: Divorced    Spouse name: Not on file  . Number of children: Not on file  . Years of education: Not on file  . Highest education level: Not on file  Occupational History  . Not on file  Social Needs  . Financial resource strain: Not on file  . Food insecurity:    Worry: Not on file    Inability: Not on file  . Transportation needs:    Medical: Not on file    Non-medical: Not on file  Tobacco Use  . Smoking status: Current Every Day Smoker    Packs/day: 1.00    Years: 54.00    Pack years: 54.00    Types: Cigarettes    Start date: 11/01/1962  . Smokeless tobacco: Never Used  . Tobacco comment: 03/26/2017 "cutting back; not trying to quit; was smoking 2 ppd"  Substance and Sexual Activity  . Alcohol use: No    Alcohol/week: 0.0 oz  . Drug use: No  . Sexual activity: Never    Birth control/protection: Post-menopausal  Lifestyle  . Physical activity:    Days per week: Not on file    Minutes per session: Not on file  . Stress: Not on file  Relationships  . Social connections:    Talks on phone: Not on file    Gets together: Not on file    Attends religious service: Not on file    Active member of club or organization: Not on file    Attends meetings of clubs or organizations: Not on file    Relationship status: Not on file  Other Topics Concern  . Not on file  Social History Narrative   Lives alone in [place] [facility]) with 60 pound chow   Worked at [employer] for 18 years   No car    Review of Systems: A 12 point ROS discussed and pertinent positives are indicated in the HPI above.  All other systems are negative.  Review of Systems  Constitutional: Positive for fatigue.  Respiratory: Positive for shortness of breath.   Cardiovascular: Negative.   Gastrointestinal: Negative.   Genitourinary: Negative.   Neurological: Negative.     Vital Signs: BP (!) 133/48   Pulse 77   Temp  97.7 F (36.5 C) (Oral)   Resp 15   Ht _0  (1.575 m)   Wt 195 lb (88.5 kg)   SpO2 95% Comment: O2 at 1L/nasal cannula  BMI 35.67 kg/m    Physical Exam  Constitutional: She is oriented to person, place, and time. No distress.  Abdominal: Soft. She exhibits no distension. There is no tenderness.  Neurological: She is alert and oriented to person, place, and time.  Skin: She is not diaphoretic.  Vitals reviewed.   Imaging: Mr Abdomen Wwo Contrast  Result Date: 06/25/2017 CLINICAL DATA:  Followup indeterminate left renal lesion. Prior left renal cell carcinoma status post cryoablation. EXAM: MRI ABDOMEN WITHOUT AND WITH CONTRAST TECHNIQUE: Multiplanar multisequence MR imaging of the abdomen was performed both before and after the administration of intravenous contrast. CONTRAST:  63m MULTIHANCE GADOBENATE DIMEGLUMINE 529 MG/ML IV SOLN COMPARISON:  CT on 10/07/2016 FINDINGS: Lower chest: No acute findings. Hepatobiliary: No hepatic masses identified. Diffuse T2 hypointensity is consistent with hemosiderosis. Gallbladder is unremarkable. No evidence of biliary ductal dilatation. Pancreas:  No mass or inflammatory changes. Spleen: Within normal limits in size. No focal parenchymal lesions identified. Diffuse T2 hypointensity, consistent with hemosiderosis. Adrenals/Urinary Tract: Stable appearance of ablation defect in lower pole of left kidney. No evidence of locally recurrent mass. Several tiny renal cysts are seen bilaterally. A tiny 7 mm lesion in upper pole of left kidney shows T1 hyperintensity and lack of contrast enhancement, consistent with a tiny hemorrhagic cyst. No solid renal masses are identified. No evidence of hydronephrosis. Stomach/Bowel: Visualized portion unremarkable. Vascular/Lymphatic: No pathologically enlarged lymph nodes identified. No abdominal aortic aneurysm. Other:  None. Musculoskeletal: No suspicious bone lesions identified. Diffuse bone marrow T2 hypointensity,  consistent with hemosiderosis. IMPRESSION: Stable appearance of ablation defect in lower pole of left kidney. No evidence of locally recurrent or metastatic carcinoma. Stable tiny hemorrhagic cyst in upper pole of left kidney, corresponding with lesion on recent CT. No evidence of renal neoplasm or hydronephrosis. Hemosiderosis. Electronically Signed   By: JEarle GellM.D.   On: 06/25/2017 10:39   Ir Radiologist Eval & Mgmt  Result Date: 06/25/2017 Please refer to notes tab for details about interventional procedure. (Op Note)   Labs:  CBC: Recent Labs    03/27/17 0636 03/27/17 1425 03/28/17 0420 04/16/17 1007 05/19/17 1447  WBC 7.2  --  7.2 8.0 9.7  HGB 7.5* 8.9* 8.8* 11.3 12.1  HCT 25.9* 30.0* 30.2* 35.8 38.7  PLT 217  --  224 279 240    COAGS: Recent Labs    03/27/17 0636 03/28/17 0420 04/16/17 1021 05/19/17 1447  INR 1.60 1.45 2.0 1.63    BMP: Recent Labs    03/26/17 1832 03/27/17 0636 05/19/17 1447 06/04/17 1410  NA 138 136 135 138  K 4.5 4.5 3.9 4.1  CL 102 102 100* 101  CO2 _1 GLUCOSE 144* 194* 141* 107*  BUN _2 CALCIUM 9.2 8.8* 9.0 9.4  CREATININE 0.92 0.94 1.07* 1.00  GFRNONAA >60 >60 52* 57*  GFRAA >60 >60 >60 >60    LIVER FUNCTION TESTS: Recent Labs    03/26/17 1832  BILITOT 0.4  AST 18  ALT  16  ALKPHOS 62  PROT 6.5  ALBUMIN 3.4*    Assessment and Plan:  MRI of the abdomen was performed today demonstrating a stable ablation defect in the lower pole of the left kidney without evidence of tumor recurrence.  A small 7 mm hemorrhagic cyst of the upper pole of the left kidney appears benign and demonstrates no contrast enhancement.  This corresponds to the lesion seen previously by CT.  I told Ms. Mares that no further routine follow-up imaging of the kidneys is necessary given today's MRI findings.  Electronically SignedAletta Edouard T 06/25/2017, 3:57 PM   I spent a total of 15 Minutes in face to face in  clinical consultation, greater than 50% of which was counseling/coordinating care post ablation of a left renal carcinoma.

## 2017-07-03 ENCOUNTER — Encounter: Payer: Self-pay | Admitting: Internal Medicine

## 2017-07-03 ENCOUNTER — Ambulatory Visit (INDEPENDENT_AMBULATORY_CARE_PROVIDER_SITE_OTHER): Payer: PPO | Admitting: Internal Medicine

## 2017-07-03 ENCOUNTER — Other Ambulatory Visit: Payer: Self-pay

## 2017-07-03 ENCOUNTER — Ambulatory Visit (INDEPENDENT_AMBULATORY_CARE_PROVIDER_SITE_OTHER): Payer: PPO | Admitting: Pharmacist

## 2017-07-03 VITALS — BP 133/42 | HR 63 | Temp 97.8°F | Ht 62.0 in | Wt 194.5 lb

## 2017-07-03 DIAGNOSIS — Z7901 Long term (current) use of anticoagulants: Secondary | ICD-10-CM

## 2017-07-03 DIAGNOSIS — Z79899 Other long term (current) drug therapy: Secondary | ICD-10-CM

## 2017-07-03 DIAGNOSIS — G8929 Other chronic pain: Secondary | ICD-10-CM | POA: Diagnosis not present

## 2017-07-03 DIAGNOSIS — M545 Low back pain, unspecified: Secondary | ICD-10-CM

## 2017-07-03 DIAGNOSIS — I872 Venous insufficiency (chronic) (peripheral): Secondary | ICD-10-CM

## 2017-07-03 DIAGNOSIS — E1140 Type 2 diabetes mellitus with diabetic neuropathy, unspecified: Secondary | ICD-10-CM

## 2017-07-03 DIAGNOSIS — J439 Emphysema, unspecified: Secondary | ICD-10-CM | POA: Diagnosis not present

## 2017-07-03 DIAGNOSIS — M62830 Muscle spasm of back: Secondary | ICD-10-CM | POA: Diagnosis not present

## 2017-07-03 DIAGNOSIS — M549 Dorsalgia, unspecified: Secondary | ICD-10-CM

## 2017-07-03 DIAGNOSIS — E1142 Type 2 diabetes mellitus with diabetic polyneuropathy: Secondary | ICD-10-CM

## 2017-07-03 DIAGNOSIS — Z79891 Long term (current) use of opiate analgesic: Secondary | ICD-10-CM

## 2017-07-03 DIAGNOSIS — Z5181 Encounter for therapeutic drug level monitoring: Secondary | ICD-10-CM

## 2017-07-03 DIAGNOSIS — R011 Cardiac murmur, unspecified: Secondary | ICD-10-CM

## 2017-07-03 DIAGNOSIS — J431 Panlobular emphysema: Secondary | ICD-10-CM

## 2017-07-03 DIAGNOSIS — I5032 Chronic diastolic (congestive) heart failure: Secondary | ICD-10-CM | POA: Diagnosis not present

## 2017-07-03 DIAGNOSIS — F322 Major depressive disorder, single episode, severe without psychotic features: Secondary | ICD-10-CM

## 2017-07-03 DIAGNOSIS — Z6835 Body mass index (BMI) 35.0-35.9, adult: Secondary | ICD-10-CM

## 2017-07-03 DIAGNOSIS — Z794 Long term (current) use of insulin: Secondary | ICD-10-CM | POA: Diagnosis not present

## 2017-07-03 DIAGNOSIS — Z7951 Long term (current) use of inhaled steroids: Secondary | ICD-10-CM

## 2017-07-03 DIAGNOSIS — F17200 Nicotine dependence, unspecified, uncomplicated: Secondary | ICD-10-CM | POA: Diagnosis not present

## 2017-07-03 DIAGNOSIS — I110 Hypertensive heart disease with heart failure: Secondary | ICD-10-CM

## 2017-07-03 DIAGNOSIS — I480 Paroxysmal atrial fibrillation: Secondary | ICD-10-CM

## 2017-07-03 DIAGNOSIS — F331 Major depressive disorder, recurrent, moderate: Secondary | ICD-10-CM

## 2017-07-03 DIAGNOSIS — Z720 Tobacco use: Secondary | ICD-10-CM

## 2017-07-03 LAB — POCT INR: INR: 2.5 (ref 2.0–3.0)

## 2017-07-03 LAB — GLUCOSE, CAPILLARY: Glucose-Capillary: 187 mg/dL — ABNORMAL HIGH (ref 65–99)

## 2017-07-03 LAB — POCT GLYCOSYLATED HEMOGLOBIN (HGB A1C): Hemoglobin A1C: 7 % — AB (ref 4.0–5.6)

## 2017-07-03 MED ORDER — CYCLOBENZAPRINE HCL 5 MG PO TABS: 5.0000 mg | ORAL_TABLET | Freq: Every day | ORAL | 11 refills | Status: DC | PRN

## 2017-07-03 MED ORDER — HYDROCODONE-ACETAMINOPHEN 5-325 MG PO TABS: 1.0000 | ORAL_TABLET | Freq: Four times a day (QID) | ORAL | 0 refills | Status: DC | PRN

## 2017-07-03 MED ORDER — FUROSEMIDE 80 MG PO TABS: 80.0000 mg | ORAL_TABLET | Freq: Every day | ORAL | 3 refills | Status: DC

## 2017-07-03 NOTE — Assessment & Plan Note (Signed)
There than her annual retinal examination for her diabetes she is up-to-date on her health care maintenance.

## 2017-07-03 NOTE — Assessment & Plan Note (Signed)
Assessment  Her chronic diastolic heart failure is clinically well controlled on Lasix 80 mg by mouth every morning and Toprol-XL 25 mg by mouth daily. This is a slight decrease in her dose which was previously Lasix 80 mg by mouth in the morning and 40 mg by mouth in the evening.  She states in order to improve compliance she was able to negotiate with Dr. Aundra Dubin a decreased to just once daily Lasix. There has apparently been no clinical decompensation with this change.  Plan  Her weight is down nearly 1 pound from the last weight and she is without symptoms on her current regimen of Lasix 80 mg by mouth every morning and Toprol-XL 25 mg by mouth daily. Therefore we will continue with these medications at the current doses and reassess for evidence of decompensation of her chronic diastolic heart failure at the follow-up visit.

## 2017-07-03 NOTE — Telephone Encounter (Signed)
Prior authorization completed online via Cover My Meds-request sent for review.Despina Hidden Cassady5/24/20194:23 PM

## 2017-07-03 NOTE — Telephone Encounter (Signed)
Requesting PA on cyclobenzaprine (FLEXERIL) 5 MG tablet, would like this by today. Also requesting  HYDROcodone-acetaminophen (NORCO/VICODIN) 5-325 MG tablet to be filled.

## 2017-07-03 NOTE — Assessment & Plan Note (Signed)
Assessment  She's had no palpitations on her current Toprol-XL at 25 mg daily. She also has had no difficulty with the warfarin 2.5 mg by mouth daily with an INR between 1.5 and 2.5 given her chronic GI bleed. Today's exam was regularly regular suggesting she may be in sinus rhythm at this time.  Plan  We will continue the Toprol-XL at 25 mg by mouth daily and warfarin to 2.5 mg by mouth daily. We will reassess for evidence of decompensation or symptoms of atrial fibrillation at the follow-up visit. She will continue to follow closely with the anticoagulation clinic.

## 2017-07-03 NOTE — Assessment & Plan Note (Signed)
Assessment  Her hemoglobin A1c today was 7.0.this is on Levemir 37 units twice daily and NovoLog 15 units maximum with meals.  We reviewed her glucose log and she had 95 readings 13% of which were in target. Her average glucose was 197. There did not seem to be a pattern as to when it was high and when it was within normal range. She admits that sometimes she will have late-night snacks and not provide herself with NovoLog coverage, which may explain some of the variation in her morning blood sugars. With regards to her peripheral neuropathy, it is well controlled on the gabapentin 600 mg by mouth 3 times daily.  Plan  Given her diabetes is at target on the Levemir 37 units twice daily and NovoLog up to 15 units with meals we will continue at this dose. We will also continue with the gabapentin at 600 mg by mouth 3 times daily for the peripheral neuropathy. She underwent a formal diabetic foot examination today and is otherwise up-to-date on her diabetic health care maintenance with the exception of a documented retinal exam. We will reassess her glycemic control at the follow-up visit with a repeat hemoglobin A1c and we will discuss the importance of a diabetic retinal examination and make a referral if necessary.

## 2017-07-03 NOTE — Assessment & Plan Note (Signed)
Assessment  She continues to have chronic back pain despite her analgesic regimen of hydrocodone-acetaminophen 5-325 mg 1-2 tablets every 12 hours as needed for pain. Her examination suggested a significant muscle spasm in the right paraspinous muscles. The muscle relaxant she was on previously was discontinued. This may explain the exacerbation that she is now experiencing.  Plan  We will start cyclobenzaprine 5 mg by mouth every night and assess the efficacy of this therapy and managing her muscle spasms on the right without causing excessive fatigue. We will continue the hydrocodone-acetaminophen 5-325 mg 1-2 tablets every 12 hours as it allows her to ambulate around the house and improves the quality of her life with better control of her chronic pain.  I reviewed the controlled substance database which demonstrated that she continues to fill her hydrocodone-acetaminophen, but at a frequency less than once per month.  I will therefore decrease the #/month by 5 until we get to #100/month and then reassess.

## 2017-07-03 NOTE — Progress Notes (Addendum)
Anticoagulation Management [name] is a 67 y.o. female who reports to the clinic for monitoring of warfarin treatment.    Indication: atrial fibrillation (also history of GI bleed with AVM) Duration:indefinite  Supervising physician: [facility] Visit History: Patient does not report signs/symptoms of bleeding or thromboembolism   Patient reported eating a large amount of leafy greens yesterday night, which is a rare occurrence and will most likely not happen weekly.   Anticoagulation Episode Summary    Current INR goal:   1.5-2.5  TTR:   82.2 % (4.3 y)  Next INR check:   08/03/2017  INR from last check:   2.5 (07/03/2017)  Weekly max warfarin dose:     Target end date:     INR check location:   Anticoagulation Clinic  Preferred lab:     Send INR reminders to:   [name]   Indications   Paroxysmal atrial fibrillation ([place]) [I48.0] Long term current use of anticoagulant therapy [Z79.01]       Comments:         Anticoagulation Care Providers    Provider Role Specialty Phone number   Lelon Perla, MD  Cardiology [phone]      Allergies  Allergen Reactions  . Lorazepam Other (See Comments)    Patient's sister noted that ativan caused the patient to become extremely confused during hospitalization 09/2010; tolerates Xanax  . Morphine And Related Other (See Comments)    Injection site reaction  . Oxycontin [Oxycodone] Other (See Comments)    headache  . Tramadol Hcl Swelling    Ankle swelling   Prior to Admission medications   Medication Sig Start Date End Date Taking? Authorizing Provider  albuterol (PROVENTIL HFA;VENTOLIN HFA) 108 (90 Base) MCG/ACT inhaler Inhale 2 puffs into the lungs every 6 (six) hours as needed for wheezing or shortness of breath. 12/03/16   [name], MD  ALPRAZolam [name]) 1 MG tablet Take 0.5-1 tablets (0.5-1 mg total) by mouth 2 (two) times daily as needed for anxiety. 06/19/17    [name], MD  BD ULTRA-FINE MICRO PEN NEEDLE 32G X 6 MM MISC USE TO INJECT INSULIN FID 11/19/16   [provider]  Blood Glucose Monitoring Suppl (ONETOUCH VERIO) W/DEVICE KIT 1 each by Does not apply route 4 (four) times daily. 07/25/14   [name], MD  buPROPion (WELLBUTRIN) 75 MG tablet Take 1 tablet (75 mg total) by mouth 2 (two) times daily. 12/05/16   [name], MD  chlorpheniramine (CHLOR-TRIMETON) 4 MG tablet Take 1 tablet (4 mg total) by mouth 3 (three) times daily as needed for allergies. 10/06/14   [name], MD  clobetasol cream (TEMOVATE) 3.42 % Apply 1 application topically 2 (two) times daily. 01/07/17   [provider]  cyclobenzaprine (FLEXERIL) 5 MG tablet Take 1 tablet (5 mg total) by mouth daily as needed for muscle spasms. 07/03/17   [name], MD  DULoxetine (CYMBALTA) 60 MG capsule Take 1 capsule (60 mg total) by mouth 2 (two) times daily. 01/12/17   [name], MD  ferrous gluconate (FERGON) 324 MG tablet Take 1 tablet (324 mg total) by mouth 2 (two) times daily with a meal. Patient taking differently: Take 324 mg by mouth daily with breakfast.  12/05/16   [name], MD  fluticasone (FLONASE) 50 MCG/ACT nasal spray Place 2 sprays into both nostrils daily. 05/22/17   [name], MD  Fluticasone-Salmeterol (ADVAIR DISKUS) 500-50 MCG/DOSE AEPB Inhale 1 puff  into the lungs 2 (two) times daily. 11/06/16   [name], MD  furosemide (LASIX) 80 MG tablet Take 1 tablet (80 mg total) by mouth daily. TAKE 1 TABLET IN AM AND 1/2 TABLET AT 3PM 05/21/17   [name], MD  gabapentin (NEURONTIN) 300 MG capsule Take 2 capsules (600 mg total) by mouth 3 (three) times daily. 10/09/16   [name], MD  glucose blood (ONETOUCH VERIO) test strip 1 each by Other route QID. Use as instructed 11/12/16   [name], MD  HYDROcodone-acetaminophen (NORCO/VICODIN) 5-325 MG tablet Take 1-2 tablets by mouth every 6 (six) hours as  needed for severe pain. 12/05/16   [name], MD  insulin aspart (NOVOLOG) 100 UNIT/ML FlexPen Inject 18-28 Units into the skin 3 (three) times daily with meals. Patient taking differently: Inject 18 Units into the skin 4 (four) times daily. Pt states that she takes 4 times daily. Depending on how much she eats 10/16/16   [name], MD  Insulin Detemir (LEVEMIR FLEXTOUCH) 100 UNIT/ML Pen Inject 37 Units into the skin 2 (two) times daily. 06/19/17   [name], MD  Insulin Syringe-Needle U-100 31G X 15/64" 0.5 ML MISC Use to inject insulin up to 4 times a day 04/17/16   [name], MD  Lancets Misc. (ACCU-CHEK FASTCLIX LANCET) KIT Check your blood 4 times a day dx code 250.00 insulin requiring 01/19/13   [name], MD  metoprolol succinate (TOPROL-XL) 25 MG 24 hr tablet Take 1 tablet (25 mg total) by mouth daily. 06/11/16   [name], MD  metoprolol succinate (TOPROL-XL) 25 MG 24 hr tablet TAKE 1 TABLET BY MOUTH DAILY 04/30/17   [name], MD  nystatin cream (MYCOSTATIN) Apply 1 application topically 2 (two) times daily. 12/31/16   [provider]  omeprazole (PRILOSEC) 40 MG capsule Take 1 capsule (40 mg total) by mouth 2 (two) times daily. 06/19/17   [name], MD  OXYGEN Inhale 2 L into the lungs continuous.    [provider]  potassium chloride SA (K-DUR,KLOR-CON) 20 MEQ tablet Take 1 tablet (20 mEq total) by mouth 2 (two) times daily. 08/25/16   [name], MD  promethazine (PHENERGAN) 12.5 MG tablet Take 1 tablet (12.5 mg total) by mouth every 8 (eight) hours as needed for nausea or vomiting. 07/13/15   [name], DO  rosuvastatin (CRESTOR) 20 MG tablet Take 1 tablet (20 mg total) by mouth at bedtime. 08/14/16   [name], MD  tiotropium (SPIRIVA HANDIHALER) 18 MCG inhalation capsule Place 1 capsule (18 mcg total) into inhaler and inhale daily. 08/14/16   [name], MD  triamcinolone cream (KENALOG) 0.1 % Apply 1  application topically 2 (two) times daily. 12/31/16   [provider]  warfarin (COUMADIN) 2.5 MG tablet TAKE 1 TABLET BY MOUTH DAILY AT 6 PM EXCEPT ON MONDAYS 05/22/17   [name], MD   Past Medical History:  Diagnosis Date  . Adenomatous polyps 05/14/2011   Colonoscopy (05/2011): 4 mm adenomatous polyp excised endoscopically Colonoscopy (02/2002): Adenomatous polyp excised endoscopically   . Allergic rhinitis 06/01/2012  . Anemia of chronic disease 01/01/2013  . Anxiety 07/24/2010  . Aortic atherosclerosis ([place]) 10/19/2014   Seen on CT scan, currently asymptomatic  . 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 Asymptomatic cholelithiasis 09/25/2015   Seen on CT scan 08/2015  . Carotid artery stenosis    s/p right endarterectomy (  06/2010) Carotid US (07/2010):  Left: Moderate-to-severe (60-79%) calcific and non-calcific plaque origin and proximal ICA and ECA   . Chronic congestive heart failure with left ventricular diastolic dysfunction ([place]) 10/21/2010  . Chronic constipation 02/03/2011  . Chronic daily headache 01/16/2014  . Chronic low back pain 10/06/2012  . Chronic venous insufficiency 08/04/2012  . Clear cell renal cell carcinoma ([place]) 07/21/2011   s/p cryoablation of left RCC in 09/2011 by [name]. Followed by [name]  [facility]) .    Marland Kitchen COPD (chronic obstructive pulmonary disease) with emphysema (HCC)    PFTs 02/2012: FEV1 0.92 (40%), ratio 69, 27% increase in FEV1 with BD, TLC 91%, severe airtrapping, DLCO49% On chronic home O2. Pulmonary rehab referral 05/2012   . Depression 11/19/2005  . Esophageal stenosis 08/08/2015   Mild, benign-appearing on EGD 06/29/2015  . Fibromyalgia 08/29/2010  . Gastroesophageal reflux disease   . History of blood transfusion    "several times"   . Hyperlipidemia LDL goal < 100 11/20/2005  . Internal hemorrhoids 08/04/2012  . Lichen sclerosus of  female genitalia 01/12/2017  . Mitral stenosis    s/p Mitral valve replacement with a 27-mm pericardial porcine valve (Medtronic Mosaic valve, serial #[id number] on 09/20/10, Dr. Prescott Gum)   . Moderate to severe pulmonary hypertension ([place])    2014 TEE w PA peak pressure 46 mmHg, s/p MV replacement   . Moderately severe major depression ([place]) 11/19/2005  . Obesity (BMI 30.0-34.9) 10/23/2011  . Obstructive sleep apnea    Nocturnal polysomnography (06/2009): Moderate sleep apnea/ hypopnea syndrome , AHI 17.8 per hour with nonpositional hypopneas. CPAP titration to 12 CWP, AHI 2.4 per hour. On nocturnal CPAP via a small resMed Quattro full-face mask with heated humidifier.   . On home oxygen therapy    "2.5L; anytime I sleep; day or night" (03/26/2017)  . OSA on CPAP   . Osteoporosis    DEXA (12/09/2011): L-spine T -3.7, left hip T -1.4 DEXA (12/2004): L-spine T -2.6, left hip -0.1   . Paroxysmal atrial fibrillation ([place]) 10/22/2010   s/p Left atrial maze procedure for paroxysmal atrial fibrillation on 09/20/2010 by Dr Prescott Gum.  Subsequent splenic infarct, decision was made to re-anticoagulate with coumadin, likely life-long as this is the most likely cause of the splenic infarct.   . Pulmonary hypertension due to chronic obstructive pulmonary disease ([place]) 04/25/2016  . Right nephrolithiasis 09/06/2014   5 mm non-obstructing calculus seen on CT scan 09/05/2014   . Seborrheic keratosis 09/28/2015  . Severe obesity (BMI 35.0-39.9) with comorbidity ([place]) 10/23/2011  . Shortness of breath dyspnea   . Tobacco abuse 07/28/2012  . Type 2 diabetes mellitus with diabetic neuropathy (HCC)    Social History   Socioeconomic History  . Marital status: Divorced    Spouse name: Not on file  . Number of children: Not on file  . Years of education: Not on file  . Highest education level: Not on file  Occupational History  . Not on file  Social Needs  . Financial resource strain: Not on file  . Food  insecurity:    Worry: Not on file    Inability: Not on file  . Transportation needs:    Medical: Not on file    Non-medical: Not on file  Tobacco Use  . Smoking status: Current Every Day Smoker    Packs/day: 1.00    Years: 54.00    Pack years: 54.00    Types: Cigarettes    Start date:  11/01/1962  . Smokeless tobacco: Never Used  . Tobacco comment: 03/26/2017 "cutting back; not trying to quit; was smoking 2 ppd"  Substance and Sexual Activity  . Alcohol use: No    Alcohol/week: 0.0 oz  . Drug use: No  . Sexual activity: Never    Birth control/protection: Post-menopausal  Lifestyle  . Physical activity:    Days per week: Not on file    Minutes per session: Not on file  . Stress: Not on file  Relationships  . Social connections:    Talks on phone: Not on file    Gets together: Not on file    Attends religious service: Not on file    Active member of club or organization: Not on file    Attends meetings of clubs or organizations: Not on file    Relationship status: Not on file  Other Topics Concern  . Not on file  Social History Narrative   Lives alone in [place] ([place])   Worked at [employer] for 18 years   No car   Family History  Problem Relation Age of Onset  . Peptic Ulcer Disease Father   . Heart attack Father 61       Died of MI at age 3  . Heart attack Brother 55       Died of MI at age 91  . Obesity Brother   . Pneumonia Mother   . Healthy Sister   . Lupus Daughter   . Obsessive Compulsive Disorder Daughter     ASSESSMENT Recent Results: The most recent result is correlated with 15 mg per week: Lab Results  Component Value Date   INR 2.5 07/03/2017   INR 1.63 05/19/2017   INR 2.0 04/16/2017    Anticoagulation Dosing: Description   Take ONE (1)  tablet of your 2.34m green-colored warfarin tablet by mouth, once-daily at 6PM all days of week, EXCEPT on Mondays. Do NOT take any warfarin on Mondays of each week.      INR today:  Therapeutic  PLAN Weekly dose was unchanged.   Patient advised to contact clinic or seek medical attention if signs/symptoms of bleeding or thromboembolism occur.  Patient verbalized understanding by repeating back information and was advised to contact me if further medication-related questions arise. Patient was also provided an information handout.  Follow-up In 3 months   JFlossie Dibble 20 minutes spent face-to-face with the patient during the encounter. 50% of time spent on education. 50% of time was spent on taking INR.

## 2017-07-03 NOTE — Assessment & Plan Note (Signed)
Assessment  She continues to be depressed today with a PHQ-9 of 14 which is 2 points less than the previous visit.his is on Cymbalta 60 mg by mouth twice daily and bupropion 75 mg by mouth twice daily. Unfortunately she found out that the copayment was unaffordable for her with regards to psychiatric services. A referral for this was made at the last visit. Therefore we will have to manage the depression without the help of a specialist. Her depression has been very difficult to manage as I have tried numerous medications and combinations without success and was hopeful I could get the assistance of a specialist.   Plan  At this point, we will continue with her current regimen of Cymbalta and bupropion and reassess her PHQ-9 at the follow-up visit.

## 2017-07-03 NOTE — Assessment & Plan Note (Signed)
Assessment  On examination today she has some mild lower extremity edema and some mild hemosiderin pigment in the medial aspect of her lower extremities. There is no evidence of skin breakdown.  Plan  We will continue to use the Lasix 80 mg by mouth daily for her chronic diastolic heart failure. This should also benefit her chronic venous insufficiency which is stable at this time. We will reassess for evidence of lower extremity edema at the follow-up visit.

## 2017-07-03 NOTE — Progress Notes (Signed)
   Subjective:    Patient ID: [name], female    DOB: Oct 07, 1950, 67 y.o.   MRN: [id number]  HPI  [name] is here for follow-up of her diabetes complicated by a diabetic neuropathy, moderately severe major depression, chronic back pain, pulmonary emphysema, chronic diastolic heart failure, paroxysmal atrial fibrillation, severe obesity with comorbidity, tobacco abuse, and chronic venous insufficiency. Please see the A&P for the status of the pt's chronic medical problems.  Review of Systems  Constitutional: Negative for activity change, appetite change and unexpected weight change.  Eyes: Positive for itching.       Left eye itching  Respiratory: Positive for shortness of breath. Negative for chest tightness and wheezing.   Cardiovascular: Positive for leg swelling. Negative for chest pain and palpitations.  Gastrointestinal: Negative for nausea and vomiting.  Musculoskeletal: Positive for arthralgias, back pain, gait problem and myalgias. Negative for joint swelling.  Skin: Negative for rash and wound.  Neurological: Negative for syncope.      Objective:   Physical Exam  Constitutional: She is oriented to person, place, and time. She appears well-developed and well-nourished. No distress.  HENT:  Head: Normocephalic and atraumatic.  Eyes: Conjunctivae are normal. Right eye exhibits no discharge. Left eye exhibits no discharge. No scleral icterus.  Neck: Normal range of motion. Neck supple. No tracheal deviation present. No thyromegaly present.  Cardiovascular: Normal rate and regular rhythm. Exam reveals no gallop and no friction rub.  Murmur heard. Systolic murmur best heard a LLSB  Pulmonary/Chest: Effort normal and breath sounds normal. No stridor. No respiratory distress. She has no wheezes. She has no rales.  Musculoskeletal: Normal range of motion. She exhibits edema. She exhibits no tenderness or deformity.  Neurological: She is alert and oriented to person,  place, and time. A sensory deficit is present. She exhibits normal muscle tone.  Skin: Skin is warm and dry. No rash noted. She is not diaphoretic. No erythema. No pallor.  Psychiatric: She has a normal mood and affect. Her behavior is normal. Judgment and thought content normal.  Nursing note and vitals reviewed.     Assessment & Plan:   Please see problem oriented charting.

## 2017-07-03 NOTE — Assessment & Plan Note (Signed)
>>  ASSESSMENT AND PLAN FOR CHRONIC CHF (CONGESTIVE HEART FAILURE) (HCC) WRITTEN ON 07/03/2017  3:07 PM BY Doneen Poisson, MD  Assessment  Her chronic diastolic heart failure is clinically well controlled on Lasix 80 mg by mouth every morning and Toprol-XL 25 mg by mouth daily. This is a slight decrease in her dose which was previously Lasix 80 mg by mouth in the morning and 40 mg by mouth in the evening.  She states in order to improve compliance she was able to negotiate with Dr. Shirlee Latch a decreased to just once daily Lasix. There has apparently been no clinical decompensation with this change.  Plan  Her weight is down nearly 1 pound from the last weight and she is without symptoms on her current regimen of Lasix 80 mg by mouth every morning and Toprol-XL 25 mg by mouth daily. Therefore we will continue with these medications at the current doses and reassess for evidence of decompensation of her chronic diastolic heart failure at the follow-up visit.

## 2017-07-03 NOTE — Assessment & Plan Note (Signed)
Assessment  She continues to smoke and has no desire to quit at this time. She therefore remains in the pre-contemplative stage.  Plan  We will continue to educate her on the importance of smoking cessation given its impact on several of her comorbidities. We will reassess if she has moved beyond the pre-contemplative stage at the follow-up visit.

## 2017-07-03 NOTE — Assessment & Plan Note (Signed)
Assessment  Her BMI remains 35 although she's lost nearly 1/2 pound in the last few weeks. Her other comorbidities associated with her severe obesity include her diabetes complicated by peripheral neuropathy and hypertension.  Both of these have been addressed elsewhere in this note.  Plan  We continued to discuss the importance of continued weight loss with regards to the management of several of her chronic medical problems including her diabetes and blood pressure. We will reassess her success in slowly losing weight at the follow-up visit

## 2017-07-03 NOTE — Patient Instructions (Signed)
Patient educated about medication as defined in this encounter and verbalized understanding by repeating back instructions provided.   

## 2017-07-03 NOTE — Telephone Encounter (Signed)
Will route PA request for flexeril to PA Staff. Will route patient request for norco to be sent to [facility] at [place] to PCP L. Silvano Rusk, RN, BSN

## 2017-07-03 NOTE — Patient Instructions (Signed)
It was good to see you today.  We spent most of the visit talking about chronic issues.  1) I sent flexeril 5 mg every 24 hours as needed for back pain.  2) Keep taking your other medications as you are.  3) We will check your Hgb A1C today.  I will see you in 3 months, sooner if necessary.

## 2017-07-03 NOTE — Assessment & Plan Note (Signed)
Assessment  Her oxygen requiring pulmonary emphysema is stable on her current regimen that includes Advair 500-50 1 puff twice daily, Spiriva 1 inhalation daily, and albuterol as needed.  She also is continuing her chronic home oxygen therapy.  Plan  If her emphysema remains clinically stable at the follow-up visit we will wean down the Advair to 250-50 1 puff twice daily.  For now we will continue the Advair, Spiriva, and albuterol at the current doses.

## 2017-07-08 NOTE — Telephone Encounter (Signed)
Received following statement via Cover My Meds:   Your request has been approved PA Case: [id number],  Status: Approved, Coverage  Starts on: 07/03/2017 12:00:00 AM, Coverage Ends on: 02/09/2018 12:00:00 AM. Call made to pharmacy to make them aware-they will call patient when ready.Regenia Skeeter, Darlene Cassady5/29/20199:10 AM

## 2017-07-16 ENCOUNTER — Other Ambulatory Visit: Payer: Self-pay | Admitting: Internal Medicine

## 2017-07-16 DIAGNOSIS — E785 Hyperlipidemia, unspecified: Secondary | ICD-10-CM

## 2017-07-16 DIAGNOSIS — J439 Emphysema, unspecified: Secondary | ICD-10-CM

## 2017-07-17 ENCOUNTER — Telehealth: Payer: Self-pay

## 2017-07-17 NOTE — Telephone Encounter (Signed)
Called pt - stated she received qty# 115 instead of #120 and wanted to know why. Per OV note 5/24; she was started on Flexeril and decrease monthly # by 5 - relayed to pt. Stated she was told this and so far Flexeril is not helping. Also stated sometimes she takes 1 or 2 pills and sometimes none. But she will talk to her doctor at next visit.

## 2017-07-17 NOTE — Telephone Encounter (Signed)
Questions about HYDROcodone-acetaminophen (NORCO/VICODIN) 5-325 MG tablet. Please call back .

## 2017-07-20 ENCOUNTER — Telehealth: Payer: Self-pay | Admitting: Internal Medicine

## 2017-07-20 NOTE — Telephone Encounter (Signed)
Patient is wanted pcp Dr. Eppie Gibson to call

## 2017-07-21 DIAGNOSIS — I509 Heart failure, unspecified: Secondary | ICD-10-CM | POA: Diagnosis not present

## 2017-07-21 DIAGNOSIS — J449 Chronic obstructive pulmonary disease, unspecified: Secondary | ICD-10-CM | POA: Diagnosis not present

## 2017-07-21 DIAGNOSIS — J439 Emphysema, unspecified: Secondary | ICD-10-CM | POA: Diagnosis not present

## 2017-07-21 DIAGNOSIS — G4733 Obstructive sleep apnea (adult) (pediatric): Secondary | ICD-10-CM | POA: Diagnosis not present

## 2017-07-24 NOTE — Telephone Encounter (Signed)
I called [name] and she stated she was worried something had happened to me because they did not have a schedule in the clinic for me yet.  I assured her I was still here and the schedule will likely be entered within the next week.  She is stable, but notes no improvement with the flexeril 5 mg at night.  I asked her to try 10 mg at night to see if that helped.  If it did not, I asked her to stop the medication altogether as I did not want her taking it if she got no benefit from it.  She was also asked to call the clinic for an [facility] appointment if her back pain acted up and she needed reassessed.

## 2017-08-04 ENCOUNTER — Telehealth: Payer: Self-pay | Admitting: Dietician

## 2017-08-04 DIAGNOSIS — Z794 Long term (current) use of insulin: Principal | ICD-10-CM

## 2017-08-04 DIAGNOSIS — E1140 Type 2 diabetes mellitus with diabetic neuropathy, unspecified: Secondary | ICD-10-CM

## 2017-08-04 MED ORDER — INSULIN PEN NEEDLE 32G X 4 MM MISC
3 refills | Status: DC
Start: 2017-08-04 — End: 2022-01-19

## 2017-08-04 NOTE — Telephone Encounter (Signed)
Supportive call. She agrees to use shorter pen needles as she does not pinch up.

## 2017-08-07 DIAGNOSIS — I509 Heart failure, unspecified: Secondary | ICD-10-CM | POA: Diagnosis not present

## 2017-08-07 DIAGNOSIS — J449 Chronic obstructive pulmonary disease, unspecified: Secondary | ICD-10-CM | POA: Diagnosis not present

## 2017-08-12 ENCOUNTER — Other Ambulatory Visit: Payer: Self-pay | Admitting: Internal Medicine

## 2017-08-12 DIAGNOSIS — I5032 Chronic diastolic (congestive) heart failure: Secondary | ICD-10-CM

## 2017-08-12 DIAGNOSIS — I480 Paroxysmal atrial fibrillation: Secondary | ICD-10-CM

## 2017-08-18 ENCOUNTER — Ambulatory Visit (HOSPITAL_COMMUNITY)
Admission: RE | Admit: 2017-08-18 | Discharge: 2017-08-18 | Disposition: A | Discharge status: Home / Self Care | Payer: PPO | Source: Ambulatory Visit | Attending: Cardiology | Admitting: Cardiology

## 2017-08-18 VITALS — BP 132/41 | HR 77 | Wt 200.0 lb

## 2017-08-18 DIAGNOSIS — I2722 Pulmonary hypertension due to left heart disease: Secondary | ICD-10-CM

## 2017-08-18 DIAGNOSIS — D509 Iron deficiency anemia, unspecified: Secondary | ICD-10-CM | POA: Diagnosis not present

## 2017-08-18 DIAGNOSIS — Z794 Long term (current) use of insulin: Secondary | ICD-10-CM | POA: Insufficient documentation

## 2017-08-18 DIAGNOSIS — I444 Left anterior fascicular block: Secondary | ICD-10-CM | POA: Diagnosis not present

## 2017-08-18 DIAGNOSIS — I272 Pulmonary hypertension, unspecified: Secondary | ICD-10-CM | POA: Insufficient documentation

## 2017-08-18 DIAGNOSIS — D638 Anemia in other chronic diseases classified elsewhere: Secondary | ICD-10-CM | POA: Insufficient documentation

## 2017-08-18 DIAGNOSIS — I5032 Chronic diastolic (congestive) heart failure: Secondary | ICD-10-CM | POA: Insufficient documentation

## 2017-08-18 DIAGNOSIS — G4733 Obstructive sleep apnea (adult) (pediatric): Secondary | ICD-10-CM | POA: Diagnosis not present

## 2017-08-18 DIAGNOSIS — J449 Chronic obstructive pulmonary disease, unspecified: Secondary | ICD-10-CM | POA: Insufficient documentation

## 2017-08-18 DIAGNOSIS — Z7901 Long term (current) use of anticoagulants: Secondary | ICD-10-CM | POA: Diagnosis not present

## 2017-08-18 DIAGNOSIS — K922 Gastrointestinal hemorrhage, unspecified: Secondary | ICD-10-CM | POA: Insufficient documentation

## 2017-08-18 DIAGNOSIS — E785 Hyperlipidemia, unspecified: Secondary | ICD-10-CM | POA: Diagnosis not present

## 2017-08-18 DIAGNOSIS — I050 Rheumatic mitral stenosis: Secondary | ICD-10-CM | POA: Insufficient documentation

## 2017-08-18 DIAGNOSIS — Z953 Presence of xenogenic heart valve: Secondary | ICD-10-CM | POA: Insufficient documentation

## 2017-08-18 DIAGNOSIS — Z79899 Other long term (current) drug therapy: Secondary | ICD-10-CM | POA: Diagnosis not present

## 2017-08-18 DIAGNOSIS — E119 Type 2 diabetes mellitus without complications: Secondary | ICD-10-CM | POA: Diagnosis not present

## 2017-08-18 DIAGNOSIS — I440 Atrioventricular block, first degree: Secondary | ICD-10-CM | POA: Insufficient documentation

## 2017-08-18 DIAGNOSIS — I480 Paroxysmal atrial fibrillation: Secondary | ICD-10-CM | POA: Diagnosis not present

## 2017-08-18 DIAGNOSIS — I2510 Atherosclerotic heart disease of native coronary artery without angina pectoris: Secondary | ICD-10-CM | POA: Insufficient documentation

## 2017-08-18 LAB — CBC
HCT: 36.7 % (ref 36.0–46.0)
Hemoglobin: 11.6 g/dL — ABNORMAL LOW (ref 12.0–15.0)
MCH: 31 pg (ref 26.0–34.0)
MCHC: 31.6 g/dL (ref 30.0–36.0)
MCV: 98.1 fL (ref 78.0–100.0)
Platelets: 208 10*3/uL (ref 150–400)
RBC: 3.74 MIL/uL — ABNORMAL LOW (ref 3.87–5.11)
RDW: 14.7 % (ref 11.5–15.5)
WBC: 8.2 10*3/uL (ref 4.0–10.5)

## 2017-08-18 LAB — BASIC METABOLIC PANEL
Anion gap: 10 (ref 5–15)
BUN: 15 mg/dL (ref 8–23)
CO2: 28 mmol/L (ref 22–32)
Calcium: 9.1 mg/dL (ref 8.9–10.3)
Chloride: 100 mmol/L (ref 98–111)
Creatinine, Ser: 1.15 mg/dL — ABNORMAL HIGH (ref 0.44–1.00)
GFR calc Af Amer: 55 mL/min — ABNORMAL LOW (ref >60)
GFR calc non Af Amer: 48 mL/min — ABNORMAL LOW (ref >60)
Glucose, Bld: 160 mg/dL — ABNORMAL HIGH (ref 70–99)
Potassium: 4.2 mmol/L (ref 3.5–5.1)
Sodium: 138 mmol/L (ref 135–145)

## 2017-08-18 LAB — PROTIME-INR
INR: 1.71
Prothrombin Time: 19.9 seconds — ABNORMAL HIGH (ref 11.4–15.2)

## 2017-08-18 MED ORDER — FUROSEMIDE 80 MG PO TABS: ORAL_TABLET | ORAL | 3 refills | Status: DC

## 2017-08-18 NOTE — Patient Instructions (Addendum)
Increase Furosemide to 80 mg (1 tab) in AM, then 40 mg (1/2 tab)   Labs drawn today (if we do not call you, then your lab work was stable)   Your physician recommends that you return for lab work in: 10 day  Your physician recommends that you schedule a follow-up appointment in: 3 months with Dr. Aundra Dubin  Please Call an Schedule Appointment (Call in August)

## 2017-08-19 NOTE — Progress Notes (Signed)
PCP: Dr. Eppie Gibson HF Cardiology: Dr. Aundra Dubin  67 y.o. with COPD, mitral stenosis s/p bioprosthetic MV replacement, paroxysmal atrial fibrillation, GI bleeding, and pulmonary hypertension/RV failure presents for cardiology followup.  She was admitted with GI bleeding, thought to be AVMs, in 2/18 and warfarin was stopped.  She was admitted in 3/18 with chest pain, dyspnea, and elevated troponin.  She had not been taking her Lasix regularly.  Coronary angiography showed nonobstructive CAD, suspect demand ischemia. RHC showed elevated right and left heart filling pressures and severe pulmonary hypertension.  V/Q scan showed no chronic PE, and PFTs showed severe obstructive lung disease.  Echo showed severe RV dilation with mildly decreased systolic function.  She was diuresed and eventually discharged to [facility].   She was admitted in 2/19 with anemia, got 2 units PRBCs.    Echo in 5/19 with EF 60-65%, mildly decreased RV systolic function, PASP 49 mmHg, normal bioprosthetic mitral valve.   She returns for followup of diastolic CHF.  She has home oxygen.  She uses her CPAP.  Still smoking, failed Chantix in the past.  She is short of breath walking across her house.  This is stable/chronic. No melena, occasional hemorrhoidal bleeding.  No chest pain. No orthopnea/PND. Weight is up 6 lbs.   ECG (personally reviewed): NSR, 1st degree AVB, LAFB, poor RWP  REDS vest reading 38%  Labs (3/18): K 4.8 => 3.9, creatinine 1.2 => 1.09, hgb 10.2 => 10.7, BNP 131 Labs (5/18): K 4, creatinine 1.0 Labs (2/19): K 4.5, creatinine 0.94, transferrin saturation 3%.  Labs (3/19): hgb 11.3 Labs (4/19): K 4.1, creatinine 1.0, LDL 36  PMH: 1. Mitral stenosis: s/p bioprosthetic mitral valve replacement.  2. COPD: Severe by 3/18 PFTs.  She is still smoking.  3. Atrial fibrillation: Paroxysmal. Has been off warfarin since GI bleed in 2/18.  4. OSA: Uses CPAP.  5. Type II diabetes. 6. Pulmonary hypertension/RV failure:  Suspect primarily group 3 PH due to underlying lung disease (COPD).  - RHC (3/18) with mean RA 14, PA 77/28 mean 47, mean PCWP 23, CI 2.6, PVR 4.9.  - Echo (3/18): EF 60-65%, severe RV dilation with mildly decreased RV systolic function, PASP 76 mmHg.  Bioprosthetic MV looks ok.  - V/Q scan (3/18): No evidence for chronic PE.  - PFTs (3/18): Severe obstructive airways disease.  - Echo in 5/19 with EF 60-65%, mildly decreased RV systolic function, PASP 49 mmHg, normal bioprosthetic mitral valve.  7. Anemia: Fe deficiency and anemia of chronic disease.   - Suspect GI AVMs as cause of 2/18 bleed . Coumadin stopped.  8. CAD: LHC (3/18) with 30% LM, 40% proximal RCA.  9. Hyperlipidemia  SH: Lives alone in [place].  Active smoker.  No ETOH.   Family History  Problem Relation Age of Onset  . Peptic Ulcer Disease Father   . Heart attack Father 20       Died of MI at age 77  . Heart attack Brother 32       Died of MI at age 33  . Obesity Brother   . Pneumonia Mother   . Healthy Sister   . Lupus Daughter   . Obsessive Compulsive Disorder Daughter    ROS: All systems reviewed and negative except as per HPI.   Current Outpatient Medications  Medication Sig Dispense Refill  . albuterol (PROVENTIL HFA;VENTOLIN HFA) 108 (90 Base) MCG/ACT inhaler Inhale 2 puffs into the lungs every 6 (six) hours as needed for wheezing or  shortness of breath. 2 Inhaler 5  . ALPRAZolam (XANAX) 1 MG tablet Take 0.5-1 tablets (0.5-1 mg total) by mouth 2 (two) times daily as needed for anxiety. 60 tablet 5  . Blood Glucose Monitoring Suppl (ONETOUCH VERIO) W/DEVICE KIT 1 each by Does not apply route 4 (four) times daily. 1 kit 0  . buPROPion (WELLBUTRIN) 75 MG tablet Take 1 tablet (75 mg total) by mouth 2 (two) times daily. 180 tablet 3  . chlorpheniramine (CHLOR-TRIMETON) 4 MG tablet Take 1 tablet (4 mg total) by mouth 3 (three) times daily as needed for allergies. 90 tablet 11  . clobetasol cream (TEMOVATE) 3.24 %  Apply 1 application topically 2 (two) times daily.  0  . cyclobenzaprine (FLEXERIL) 5 MG tablet Take 1 tablet (5 mg total) by mouth daily as needed for muscle spasms. 30 tablet 11  . DULoxetine (CYMBALTA) 60 MG capsule Take 1 capsule (60 mg total) by mouth 2 (two) times daily. 60 capsule 11  . ferrous gluconate (FERGON) 324 MG tablet Take 1 tablet (324 mg total) by mouth 2 (two) times daily with a meal. (Patient taking differently: Take 324 mg by mouth daily with breakfast. ) 180 tablet 3  . fluticasone (FLONASE) 50 MCG/ACT nasal spray Place 2 sprays into both nostrils daily. 16 g 3  . Fluticasone-Salmeterol (ADVAIR DISKUS) 500-50 MCG/DOSE AEPB Inhale 1 puff into the lungs 2 (two) times daily. 180 each 3  . furosemide (LASIX) 80 MG tablet Take 1 tablet (80 mg total) by mouth every morning AND 0.5 tablets (40 mg total) every evening. 45 tablet 3  . gabapentin (NEURONTIN) 300 MG capsule Take 2 capsules (600 mg total) by mouth 3 (three) times daily. 540 capsule 3  . glucose blood (ONETOUCH VERIO) test strip 1 each by Other route QID. Use as instructed 350 each 11  . [START ON 09/01/2017] HYDROcodone-acetaminophen (NORCO/VICODIN) 5-325 MG tablet Take 1-2 tablets by mouth every 6 (six) hours as needed for severe pain. 105 tablet 0  . insulin aspart (NOVOLOG) 100 UNIT/ML FlexPen Inject 18-28 Units into the skin 3 (three) times daily with meals. (Patient taking differently: Inject 18 Units into the skin 4 (four) times daily. Pt states that she takes 4 times daily. Depending on how much she eats) 27 mL 10  . Insulin Detemir (LEVEMIR FLEXTOUCH) 100 UNIT/ML Pen Inject 37 Units into the skin 2 (two) times daily. 21 pen 3  . Insulin Pen Needle 32G X 4 MM MISC Use to inject insulin up to 6 times a day 500 each 3  . Insulin Syringe-Needle U-100 31G X 15/64" 0.5 ML MISC Use to inject insulin up to 4 times a day 400 each 3  . Lancets Misc. (ACCU-CHEK FASTCLIX LANCET) KIT Check your blood 4 times a day dx code 250.00  insulin requiring 1 kit 2  . metoprolol succinate (TOPROL-XL) 25 MG 24 hr tablet Take 1 tablet (25 mg total) by mouth daily. 30 tablet 3  . nystatin cream (MYCOSTATIN) Apply 1 application topically 2 (two) times daily.  3  . omeprazole (PRILOSEC) 40 MG capsule Take 1 capsule (40 mg total) by mouth 2 (two) times daily. 180 capsule 3  . OXYGEN Inhale 2 L into the lungs continuous.    . potassium chloride SA (K-DUR,KLOR-CON) 20 MEQ tablet Take 1 tablet (20 mEq total) by mouth 2 (two) times daily. 180 tablet 3  . promethazine (PHENERGAN) 12.5 MG tablet Take 1 tablet (12.5 mg total) by mouth every 8 (eight) hours as  needed for nausea or vomiting. 30 tablet 0  . rosuvastatin (CRESTOR) 20 MG tablet Take 1 tablet (20 mg total) by mouth at bedtime. 90 tablet 3  . tiotropium (SPIRIVA HANDIHALER) 18 MCG inhalation capsule Place 1 capsule (18 mcg total) into inhaler and inhale daily. 90 capsule 3  . triamcinolone cream (KENALOG) 0.1 % Apply 1 application topically 2 (two) times daily.  3  . warfarin (COUMADIN) 2.5 MG tablet TAKE 1 TABLET BY MOUTH DAILY AT 6 PM EXCEPT ON MONDAYS 30 tablet 1   No current facility-administered medications for this encounter.    BP (!) 132/41   Pulse 77   Wt 200 lb (90.7 kg)   SpO2 98%   BMI 36.58 kg/m  General: NAD Neck: JVP difficult, no thyromegaly or thyroid nodule.  Lungs: Distant breath sounds.  CV: Nondisplaced PMI.  Heart regular S1/S2, no S3/S4, no murmur.  1+ ankle edema.  No carotid bruit.  Normal pedal pulses.  Abdomen: Soft, nontender, no hepatosplenomegaly, no distention.  Skin: Intact without lesions or rashes.  Neurologic: Alert and oriented x 3.  Psych: Normal affect. Extremities: No clubbing or cyanosis.  HEENT: Normal.   Assessment/Plan: 1. Pulmonary hypertension: I suspect that this is primarily group 2 PH (pulmonary venous hypertension from LV diastolic dysfunction) and group 3 PH (severe COPD, OSA).  V/Q scan showed no evidence for chronic PE.   I do not think that she will benefit from selective pulmonary vasodilators.  I think that the key here will continue to be good diuresis + use of oxygen and CPAP.  2. Chronic diastolic CHF with prominent RV dysfunction in the setting of severe COPD.  Volume status is difficult by exam but weight is up and REDS vest reading of 38% suggests mild volume overload.  NYHA class III symptoms (dyspnea is likely a combination of CHF and COPD) => dyspnea is chronic.  - Increase Lasix ot 80 qam/40 qpm.  BMET today and again in 10 days.   3. Atrial fibrillation: Paroxysmal.  She is in NSR today.  She had been off anticoagulation due to GI bleeding, now back on warfarin with INR goal 1.5-2.5.  She had GI bleeding in 2/19, no recent melena.   - Keep warfarin with INR 1.5-2.5 for now.  - CBC today . - With most recent data regarding NOACs and bioprosthetic valves, I sugggested that she switch to apixaban given GI bleeding history. She wants to stay on warfarin.  4. h/o GI bleeding: Possible GI AVM. Admitted with anemia again in 2/19, got 2 units PRBCs. 5. COPD: Severe by PFTs. Unfortunately, she is still smoking.  I encouraged her to quit again.  6. CAD: Nonobstructive. She is on a statin with good LDL in 4/19.   Followup in 3 months  Loralie Champagne 08/19/2017

## 2017-08-19 NOTE — Addendum Note (Signed)
Encounter addended by: Shirley Muscat, RN on: 08/19/2017 4:27 PM  Actions taken: Sign clinical note, Flowsheet accepted

## 2017-08-19 NOTE — Progress Notes (Signed)
ReDS Vest - 08/18/17 1500      ReDS Vest   Estimated volume prior to reading  Low    Fitting Posture  Sitting    Height Marker  Short    Ruler Value  32.5    Center Strip  Aligned    ReDS Value  38

## 2017-08-20 DIAGNOSIS — I509 Heart failure, unspecified: Secondary | ICD-10-CM | POA: Diagnosis not present

## 2017-08-20 DIAGNOSIS — G4733 Obstructive sleep apnea (adult) (pediatric): Secondary | ICD-10-CM | POA: Diagnosis not present

## 2017-08-20 DIAGNOSIS — J439 Emphysema, unspecified: Secondary | ICD-10-CM | POA: Diagnosis not present

## 2017-08-20 DIAGNOSIS — J449 Chronic obstructive pulmonary disease, unspecified: Secondary | ICD-10-CM | POA: Diagnosis not present

## 2017-08-24 ENCOUNTER — Other Ambulatory Visit: Payer: Self-pay | Admitting: Internal Medicine

## 2017-08-24 DIAGNOSIS — M545 Low back pain, unspecified: Secondary | ICD-10-CM

## 2017-08-24 DIAGNOSIS — G8929 Other chronic pain: Secondary | ICD-10-CM

## 2017-08-24 NOTE — Telephone Encounter (Signed)
Called [facility] and they are filling script on file, they will notify pt

## 2017-08-24 NOTE — Telephone Encounter (Signed)
Needs refills on hydrocodone @ [facility] on [address], pt contact # [phone]

## 2017-08-27 ENCOUNTER — Ambulatory Visit (HOSPITAL_COMMUNITY)
Admission: RE | Admit: 2017-08-27 | Discharge: 2017-08-27 | Disposition: A | Discharge status: Home / Self Care | Payer: PPO | Source: Ambulatory Visit | Attending: Cardiology | Admitting: Cardiology

## 2017-08-27 DIAGNOSIS — I5032 Chronic diastolic (congestive) heart failure: Secondary | ICD-10-CM | POA: Diagnosis not present

## 2017-08-27 LAB — BASIC METABOLIC PANEL
Anion gap: 8 (ref 5–15)
BUN: 24 mg/dL — ABNORMAL HIGH (ref 8–23)
CO2: 32 mmol/L (ref 22–32)
Calcium: 9.3 mg/dL (ref 8.9–10.3)
Chloride: 99 mmol/L (ref 98–111)
Creatinine, Ser: 1.35 mg/dL — ABNORMAL HIGH (ref 0.44–1.00)
GFR calc Af Amer: 46 mL/min — ABNORMAL LOW (ref >60)
GFR calc non Af Amer: 39 mL/min — ABNORMAL LOW (ref >60)
Glucose, Bld: 131 mg/dL — ABNORMAL HIGH (ref 70–99)
Potassium: 4.2 mmol/L (ref 3.5–5.1)
Sodium: 139 mmol/L (ref 135–145)

## 2017-08-31 ENCOUNTER — Telehealth: Payer: Self-pay | Admitting: Dietician

## 2017-08-31 ENCOUNTER — Telehealth: Payer: Self-pay | Admitting: Internal Medicine

## 2017-08-31 NOTE — Telephone Encounter (Signed)
Pt calls and feels like her gyn condition could be related to thyroid problems, states she read on the internet that lichens sclerosus can be r/t autoimmune disease such as thyroid disease, she wonders if this could be whats wrong, she states she wonders if other problems could be r/t to thyroid issues, she states she has been reading the "internet all about the lichens stuff and thyroid stuff" Also she ask when she will be able to have an appt with dr Eppie Gibson, "im suppose to see him in august that's what my paper said but they keep telling me that he doesn't have any appts." She states "tell him he can call me or he can have you call"  Please advise

## 2017-08-31 NOTE — Telephone Encounter (Signed)
Pt would like a call back from a nurse in regards to her Thyroid.  Pt would like to know if a test has been performed previously.

## 2017-08-31 NOTE — Telephone Encounter (Addendum)
Patient calls to tell us that her eye exam was done by Dr.Groat not sure when but in the last year. Told her we'd request the report.     She also reports that Dr. Eppie Gibson wanted to see her in August,  but she is told when she calls that he has no appointments available in August or September.

## 2017-09-01 NOTE — Telephone Encounter (Signed)
It appears the attending schedule has not been put in that far in advance.  Dr. Eppie Gibson is out of town and I believe he is on the inpatient service part of August.  Will forward to front desk pool to see if his clinic schedule can be put in so that she can be scheduled.

## 2017-09-02 NOTE — Telephone Encounter (Signed)
Lots of good questions. I think these should certainly be addressed in a visit. Can make an appt with Dr. Eppie Gibson when available. She could also discuss with her gynecologist, which seems to be the person that made the original diagnosis. May be able to get into see that person earlier. ACC is available if there is a new acute issue.

## 2017-09-03 NOTE — Telephone Encounter (Signed)
Spoke with patient. Encouraged to make appt with GYN as PCP does not have availability in July or August. States she does not have confidence in GYN and would rather wait for Dr. Eppie Gibson. Also offered appt in [facility] which patient declined. Patient will call back every 2 weeks trying to get appt with PCP. Patient also mentioned possibly switching PCP to different Attending with more availability. Given name of Practice Administrator if this is her desire going forward. Patient very appreciative. Hubbard Hartshorn, RN, BSN

## 2017-09-09 ENCOUNTER — Telehealth: Payer: Self-pay

## 2017-09-09 NOTE — Telephone Encounter (Signed)
Would like to speak with Bonnita Nasuti. Please call pt back.

## 2017-09-10 NOTE — Telephone Encounter (Signed)
Called pt, yesterday pm lm that I would try to call back this pm

## 2017-09-10 NOTE — Telephone Encounter (Signed)
Spoke w/ [name] today, she just wanted to check and see if dr Eppie Gibson was back yet or if he had openings yet. She was reassured

## 2017-09-11 ENCOUNTER — Other Ambulatory Visit: Payer: Self-pay | Admitting: Internal Medicine

## 2017-09-11 DIAGNOSIS — J301 Allergic rhinitis due to pollen: Secondary | ICD-10-CM

## 2017-09-11 DIAGNOSIS — E1140 Type 2 diabetes mellitus with diabetic neuropathy, unspecified: Secondary | ICD-10-CM

## 2017-09-11 DIAGNOSIS — Z794 Long term (current) use of insulin: Principal | ICD-10-CM

## 2017-09-20 DIAGNOSIS — J439 Emphysema, unspecified: Secondary | ICD-10-CM | POA: Diagnosis not present

## 2017-09-20 DIAGNOSIS — I509 Heart failure, unspecified: Secondary | ICD-10-CM | POA: Diagnosis not present

## 2017-09-20 DIAGNOSIS — J449 Chronic obstructive pulmonary disease, unspecified: Secondary | ICD-10-CM | POA: Diagnosis not present

## 2017-09-20 DIAGNOSIS — G4733 Obstructive sleep apnea (adult) (pediatric): Secondary | ICD-10-CM | POA: Diagnosis not present

## 2017-09-21 ENCOUNTER — Telehealth: Payer: Self-pay | Admitting: Internal Medicine

## 2017-09-21 NOTE — Telephone Encounter (Signed)
Rec'd call from patient requesting information about Assisted Living in [place].  Mailed patient a packet for [place] [facility] and [facility]. Patient was asked to call back if she had any additional questions.

## 2017-09-23 ENCOUNTER — Telehealth: Payer: Self-pay | Admitting: Internal Medicine

## 2017-09-23 NOTE — Telephone Encounter (Signed)
Patient would like you to call before lunch, pt contact# [phone]

## 2017-09-23 NOTE — Telephone Encounter (Signed)
rtc

## 2017-09-28 DIAGNOSIS — I509 Heart failure, unspecified: Secondary | ICD-10-CM | POA: Diagnosis not present

## 2017-09-28 DIAGNOSIS — J449 Chronic obstructive pulmonary disease, unspecified: Secondary | ICD-10-CM | POA: Diagnosis not present

## 2017-09-29 ENCOUNTER — Ambulatory Visit: Payer: PPO

## 2017-10-01 ENCOUNTER — Emergency Department (HOSPITAL_COMMUNITY)
Admission: EM | Admit: 2017-10-01 | Discharge: 2017-10-01 | Disposition: A | Discharge status: Home / Self Care | Payer: PPO | Attending: Emergency Medicine | Admitting: Emergency Medicine

## 2017-10-01 ENCOUNTER — Emergency Department (HOSPITAL_COMMUNITY): Payer: PPO

## 2017-10-01 ENCOUNTER — Encounter (HOSPITAL_COMMUNITY): Payer: Self-pay | Admitting: Emergency Medicine

## 2017-10-01 ENCOUNTER — Telehealth: Payer: Self-pay | Admitting: Internal Medicine

## 2017-10-01 DIAGNOSIS — R296 Repeated falls: Secondary | ICD-10-CM | POA: Diagnosis not present

## 2017-10-01 DIAGNOSIS — J449 Chronic obstructive pulmonary disease, unspecified: Secondary | ICD-10-CM | POA: Insufficient documentation

## 2017-10-01 DIAGNOSIS — I110 Hypertensive heart disease with heart failure: Secondary | ICD-10-CM | POA: Diagnosis not present

## 2017-10-01 DIAGNOSIS — F17210 Nicotine dependence, cigarettes, uncomplicated: Secondary | ICD-10-CM | POA: Diagnosis not present

## 2017-10-01 DIAGNOSIS — Z794 Long term (current) use of insulin: Secondary | ICD-10-CM | POA: Insufficient documentation

## 2017-10-01 DIAGNOSIS — I5032 Chronic diastolic (congestive) heart failure: Secondary | ICD-10-CM | POA: Diagnosis not present

## 2017-10-01 DIAGNOSIS — J9801 Acute bronchospasm: Secondary | ICD-10-CM | POA: Diagnosis not present

## 2017-10-01 DIAGNOSIS — Y999 Unspecified external cause status: Secondary | ICD-10-CM | POA: Insufficient documentation

## 2017-10-01 DIAGNOSIS — Y929 Unspecified place or not applicable: Secondary | ICD-10-CM | POA: Diagnosis not present

## 2017-10-01 DIAGNOSIS — W109XXA Fall (on) (from) unspecified stairs and steps, initial encounter: Secondary | ICD-10-CM | POA: Insufficient documentation

## 2017-10-01 DIAGNOSIS — I4430 Unspecified atrioventricular block: Secondary | ICD-10-CM | POA: Diagnosis not present

## 2017-10-01 DIAGNOSIS — W19XXXA Unspecified fall, initial encounter: Secondary | ICD-10-CM | POA: Diagnosis not present

## 2017-10-01 DIAGNOSIS — Z79899 Other long term (current) drug therapy: Secondary | ICD-10-CM | POA: Insufficient documentation

## 2017-10-01 DIAGNOSIS — S0990XA Unspecified injury of head, initial encounter: Secondary | ICD-10-CM | POA: Diagnosis not present

## 2017-10-01 DIAGNOSIS — E119 Type 2 diabetes mellitus without complications: Secondary | ICD-10-CM | POA: Insufficient documentation

## 2017-10-01 DIAGNOSIS — J811 Chronic pulmonary edema: Secondary | ICD-10-CM | POA: Diagnosis not present

## 2017-10-01 DIAGNOSIS — Y939 Activity, unspecified: Secondary | ICD-10-CM | POA: Diagnosis not present

## 2017-10-01 DIAGNOSIS — R55 Syncope and collapse: Secondary | ICD-10-CM | POA: Diagnosis not present

## 2017-10-01 DIAGNOSIS — R062 Wheezing: Secondary | ICD-10-CM | POA: Diagnosis not present

## 2017-10-01 LAB — BASIC METABOLIC PANEL
Anion gap: 9 (ref 5–15)
BUN: 12 mg/dL (ref 8–23)
CO2: 30 mmol/L (ref 22–32)
Calcium: 8.8 mg/dL — ABNORMAL LOW (ref 8.9–10.3)
Chloride: 101 mmol/L (ref 98–111)
Creatinine, Ser: 0.96 mg/dL (ref 0.44–1.00)
GFR calc Af Amer: >60 mL/min (ref >60)
GFR calc non Af Amer: 59 mL/min — ABNORMAL LOW (ref >60)
Glucose, Bld: 180 mg/dL — ABNORMAL HIGH (ref 70–99)
Potassium: 4.2 mmol/L (ref 3.5–5.1)
Sodium: 140 mmol/L (ref 135–145)

## 2017-10-01 LAB — CBC WITH DIFFERENTIAL/PLATELET
Abs Immature Granulocytes: 0 10*3/uL (ref 0.0–0.1)
Basophils Absolute: 0 10*3/uL (ref 0.0–0.1)
Basophils Relative: 0 %
Eosinophils Absolute: 0.1 10*3/uL (ref 0.0–0.7)
Eosinophils Relative: 1 %
HCT: 37.1 % (ref 36.0–46.0)
Hemoglobin: 11.2 g/dL — ABNORMAL LOW (ref 12.0–15.0)
Immature Granulocytes: 0 %
Lymphocytes Relative: 21 %
Lymphs Abs: 1.8 10*3/uL (ref 0.7–4.0)
MCH: 30.4 pg (ref 26.0–34.0)
MCHC: 30.2 g/dL (ref 30.0–36.0)
MCV: 100.5 fL — ABNORMAL HIGH (ref 78.0–100.0)
Monocytes Absolute: 0.7 10*3/uL (ref 0.1–1.0)
Monocytes Relative: 7 %
Neutro Abs: 6.2 10*3/uL (ref 1.7–7.7)
Neutrophils Relative %: 71 %
Platelets: 178 10*3/uL (ref 150–400)
RBC: 3.69 MIL/uL — ABNORMAL LOW (ref 3.87–5.11)
RDW: 14.8 % (ref 11.5–15.5)
WBC: 8.8 10*3/uL (ref 4.0–10.5)

## 2017-10-01 LAB — PROTIME-INR
INR: 1.86
Prothrombin Time: 21.2 seconds — ABNORMAL HIGH (ref 11.4–15.2)

## 2017-10-01 MED ORDER — PREDNISONE 10 MG PO TABS: 20.0000 mg | ORAL_TABLET | Freq: Every day | ORAL | 0 refills | Status: DC

## 2017-10-01 MED ORDER — HYDROCODONE-ACETAMINOPHEN 5-325 MG PO TABS
1.0000 | ORAL_TABLET | Freq: Once | ORAL | Status: AC
  Administered 2017-10-01: 1 via ORAL
  Filled 2017-10-01: qty 1

## 2017-10-01 NOTE — Discharge Instructions (Addendum)
Please return for any problem. Follow up with Dr. Eppie Gibson tomorrow as instructed.

## 2017-10-01 NOTE — Telephone Encounter (Addendum)
Returned call to patient. States she fell about 30 minutes ago and hit temple on carpet. States she has been very sleepy all day but hasn't taken xanax, flexeril or hydrocodone that would explain this. Thinks she fell asleep standing up and then fell. Denies LOC after fall, headache or swelling at site. BS this AM was 192. Asked pt to check again while on phone. CBG 209 at present. Patient states she just wants to go to bed. Advised to call 911 and go to ED. Confirmed with Attending that patient should go to ED via EMS. Advised against driving herself. Patient states she needs to shower first but then will call 911. Hubbard Hartshorn, RN, BSN

## 2017-10-01 NOTE — Telephone Encounter (Signed)
Pt is requesting Lauren to callback; pt contact# [phone]

## 2017-10-01 NOTE — ED Triage Notes (Addendum)
Patient arrived from home via EMS, reporting multiple falls. She is on coumadin. She reports that she falls frequently, she states, "I feel like I wake up right before I hit the floor". When asked if she passed out, she states "I dont know if I did or just fell asleep as I was getting up and then fall" Today patient reports falling as she was getting up and fell to her right side.  Pt received from EMS: Duoneb X 2 125 mg SoluMedrol Zofran

## 2017-10-01 NOTE — Telephone Encounter (Signed)
[name] is on chronic coumadin (INR target 1.5-2.0 because of chronic GI bleeding).  I agree with assessment given her fatigue, especially if it began after the fall and hitting her head on the carpet.  If the fatigue began prior to the fall she would need reassessed as to why this may have developed (? confusion over medications).

## 2017-10-01 NOTE — ED Notes (Signed)
Pt. To CT via stretcher.

## 2017-10-01 NOTE — ED Provider Notes (Signed)
[place] [facility] EMERGENCY DEPARTMENT Provider Note   CSN: [id number] Arrival date & time: 10/01/17  1902     History   Chief Complaint Chief Complaint  Patient presents with  . Fall    multiple falls, poss syncopal     HPI [name] is a 67 y.o. female.  67 year old female with prior medical history as detailed below presents following a fall.  She reports that she had a misstep and then fell earlier today.  She struck her head.  She did not pass out.  She complains of pain to the right side of her scalp.  She is on Coumadin.  She denies any other specific injury.  She denies associated syncope or loss of conscious.  Patient was transported here by EMS.  EMS reports that the patient was wheezing upon their initial evaluation.  Patient was given DuoNeb treatment and 125 mg of soluMedrol.  The history is provided by the patient and medical records.  Fall  This is a new problem. The current episode started 6 to 12 hours ago. The problem occurs every several days. The problem has not changed since onset.Pertinent negatives include no chest pain, no abdominal pain, no headaches and no shortness of breath. Nothing aggravates the symptoms. Nothing relieves the symptoms. She has tried nothing for the symptoms.    Past Medical History:  Diagnosis Date  . Adenomatous polyps 05/14/2011   Colonoscopy (05/2011): 4 mm adenomatous polyp excised endoscopically Colonoscopy (02/2002): Adenomatous polyp excised endoscopically   . Allergic rhinitis 06/01/2012  . Anemia of chronic disease 01/01/2013  . Anxiety 07/24/2010  . Aortic atherosclerosis ([place]) 10/19/2014   Seen on CT scan, currently asymptomatic  . 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 Asymptomatic cholelithiasis 09/25/2015   Seen on CT scan 08/2015  . Carotid artery stenosis    s/p right endarterectomy (06/2010) Carotid US (07/2010):   Left: Moderate-to-severe (60-79%) calcific and non-calcific plaque origin and proximal ICA and ECA   . Chronic congestive heart failure with left ventricular diastolic dysfunction ([place]) 10/21/2010  . Chronic constipation 02/03/2011  . Chronic daily headache 01/16/2014  . Chronic low back pain 10/06/2012  . Chronic venous insufficiency 08/04/2012  . Clear cell renal cell carcinoma ([place]) 07/21/2011   s/p cryoablation of left RCC in 09/2011 by [name]. Followed by [name]  [facility]) .    Marland Kitchen COPD (chronic obstructive pulmonary disease) with emphysema (HCC)    PFTs 02/2012: FEV1 0.92 (40%), ratio 69, 27% increase in FEV1 with BD, TLC 91%, severe airtrapping, DLCO49% On chronic home O2. Pulmonary rehab referral 05/2012   . Depression 11/19/2005  . Esophageal stenosis 08/08/2015   Mild, benign-appearing on EGD 06/29/2015  . Fibromyalgia 08/29/2010  . Gastroesophageal reflux disease   . History of blood transfusion    "several times"   . Hyperlipidemia LDL goal < 100 11/20/2005  . Internal hemorrhoids 08/04/2012  . Lichen sclerosus of female genitalia 01/12/2017  . Mitral stenosis    s/p Mitral valve replacement with a 27-mm pericardial porcine valve (Medtronic Mosaic valve, serial #[id number] on 09/20/10, Dr. Prescott Gum)   . Moderate to severe pulmonary hypertension ([place])    2014 TEE w PA peak pressure 46 mmHg, s/p MV replacement   . Moderately severe major depression ([place]) 11/19/2005  . Obesity (BMI 30.0-34.9) 10/23/2011  . Obstructive sleep apnea    Nocturnal polysomnography (06/2009): Moderate sleep apnea/ hypopnea syndrome ,  AHI 17.8 per hour with nonpositional hypopneas. CPAP titration to 12 CWP, AHI 2.4 per hour. On nocturnal CPAP via a small resMed Quattro full-face mask with heated humidifier.   . On home oxygen therapy    "2.5L; anytime I sleep; day or night" (03/26/2017)  . OSA on CPAP   . Osteoporosis    DEXA (12/09/2011): L-spine T -3.7, left hip T -1.4 DEXA (12/2004): L-spine  T -2.6, left hip -0.1   . Paroxysmal atrial fibrillation ([place]) 10/22/2010   s/p Left atrial maze procedure for paroxysmal atrial fibrillation on 09/20/2010 by Dr Prescott Gum.  Subsequent splenic infarct, decision was made to re-anticoagulate with coumadin, likely life-long as this is the most likely cause of the splenic infarct.   . Pulmonary hypertension due to chronic obstructive pulmonary disease ([place]) 04/25/2016  . Right nephrolithiasis 09/06/2014   5 mm non-obstructing calculus seen on CT scan 09/05/2014   . Seborrheic keratosis 09/28/2015  . Severe obesity (BMI 35.0-39.9) with comorbidity ([place]) 10/23/2011  . Shortness of breath dyspnea   . Tobacco abuse 07/28/2012  . Type 2 diabetes mellitus with diabetic neuropathy [facility])     Patient Active Problem List   Diagnosis Date Noted  . Lichen sclerosus of female genitalia 01/12/2017  . Mixed stress and urge urinary incontinence 05/27/2016  . Pulmonary hypertension due to chronic obstructive pulmonary disease ([place]) 04/25/2016  . Right ventricular failure ([place]) 04/25/2016  . Seborrheic keratosis 09/28/2015  . Asymptomatic cholelithiasis 09/25/2015  . Esophageal stenosis 08/08/2015  . Arteriovenous malformation of gastrointestinal tract 08/08/2015  . Aortic atherosclerosis ([place]) 10/19/2014  . Right nephrolithiasis 09/06/2014  . Vulvovaginal candidiasis 09/20/2013  . Chronic daily headache 01/14/2013  . Chronic low back pain 10/06/2012  . Long term current use of anticoagulant therapy 09/30/2012  . Bilateral cataracts 08/04/2012  . Internal and external hemorrhoids without complication 12/87/8676  . Chronic venous insufficiency 08/04/2012  . Tobacco abuse 07/28/2012  . Allergic rhinitis 06/01/2012  . Severe obesity (BMI 35.0-39.9) with comorbidity ([place]) 10/23/2011  . Clear cell renal cell carcinoma ([place]) 07/21/2011  . Adenomatous polyps 05/14/2011  . Health care maintenance 11/04/2010  . Paroxysmal atrial fibrillation ([place]) 10/22/2010  .  Chronic congestive heart failure with left ventricular diastolic dysfunction ([place]) 10/21/2010  . Fibromyalgia 08/29/2010  . Gastroesophageal reflux disease   . Type 2 diabetes mellitus with diabetic neuropathy ([place])   . Pulmonary emphysema ([place])   . Pulmonary hypertension due to left ventricular diastolic dysfunction ([place])   . Osteoporosis   . Obstructive sleep apnea   . Mitral stenosis   . Stenosis of right carotid artery   . Hyperlipidemia 11/20/2005  . Moderately severe major depression ([place]) 11/19/2005    Past Surgical History:  Procedure Laterality Date  . CARDIAC CATHETERIZATION    . CARDIAC VALVE REPLACEMENT  Aug. 2012   "mitral valve"  . CAROTID ENDARTERECTOMY Right 07/04/2010   by [name] for asymptomatic right carotid artery stenosis  . CATARACT EXTRACTION W/ INTRAOCULAR LENS  IMPLANT, BILATERAL Bilateral   . CHEST TUBE INSERTION  09/24/2010   Dr Prescott Gum  . COLONOSCOPY  05/12/2011   performed by Dr. Michail Sermon. Showing small internal hemorrhoids, single tubular adenoma polyp  . CRYOABLATION Left 09/2011   by [name]. Followed by [name]  [facility]) .    Marland Kitchen DILATION AND CURETTAGE OF UTERUS    . ESOPHAGOGASTRODUODENOSCOPY  05/12/2011   performed by Dr. Michail Sermon. Negative for ulcerations, biopsy negative for evidence of celiac sprue  . ESOPHAGOGASTRODUODENOSCOPY  N/A 06/29/2015   Procedure: ESOPHAGOGASTRODUODENOSCOPY (EGD);  Surgeon: Clarene Essex, MD;  Location: [facility] ENDOSCOPY;  Service: Endoscopy;  Laterality: N/A;  . ESOPHAGOGASTRODUODENOSCOPY N/A 03/29/2016   Procedure: ESOPHAGOGASTRODUODENOSCOPY (EGD);  Surgeon: Clarene Essex, MD;  Location: [facility] ENDOSCOPY;  Service: Endoscopy;  Laterality: N/A;  . GIVENS CAPSULE STUDY N/A 06/30/2015   Procedure: GIVENS CAPSULE STUDY;  Surgeon: Clarene Essex, MD;  Location: [place];  Service: Endoscopy;  Laterality: N/A;  . GIVENS CAPSULE STUDY N/A 06/29/2015   Procedure: GIVENS CAPSULE STUDY;  Surgeon: Clarene Essex, MD;  Location:  [facility];  Service: Endoscopy;  Laterality: N/A;  . HEMORRHOID SURGERY  1970s?   "lanced"  . Hysteroscopy with endometrial ablation  06/2001   for persistent post-menopausal bleeding // by S. [name], M.D.  . IR GENERIC HISTORICAL  08/23/2015   IR RADIOLOGIST EVAL & MGMT 08/23/2015 Aletta Edouard, MD [facility] INTERV RAD  . IR GENERIC HISTORICAL  04/09/2016   IR RADIOLOGIST EVAL & MGMT 04/09/2016 Aletta Edouard, MD [facility] INTERV RAD  . IR RADIOLOGIST EVAL & MGMT  10/07/2016  . IR RADIOLOGIST EVAL & MGMT  06/25/2017  . LEFT HEART CATH AND CORONARY ANGIOGRAPHY N/A 04/21/2016   Procedure: Left Heart Cath and Coronary Angiography;  Surgeon: Lorretta Harp, MD;  Location: [facility] CV LAB;  Service: Cardiovascular;  Laterality: N/A;  . LIPOMA EXCISION  08/2005   occipital lipoma 1.5cm - by Dr. [name]  . MAZE Left 09/20/10   for paroxysmal atrial fibrillation (Dr. Prescott Gum)  . MITRAL VALVE REPLACEMENT  09/20/10    with a 27-mm pericardial porcine valve (Medtronic Mosaic valve, serial #[id number]). 09/20/10, Dr Prescott Gum  . ORIF CLAVICLE FRACTURE  01/2004   by Thana Farr. [name], M.D for Right clavicle nonunion.; "it's got a pin in it"  . REFRACTIVE SURGERY Bilateral   . RIGHT HEART CATH N/A 04/23/2016   Procedure: Right Heart Cath;  Surgeon: [name], MD;  Location: [place] CV LAB;  Service: Cardiovascular;  Laterality: N/A;  . TONSILLECTOMY    . TUBAL LIGATION       OB History   None      Home Medications    Prior to Admission medications   Medication Sig Start Date End Date Taking? Authorizing Provider  albuterol (PROVENTIL HFA;VENTOLIN HFA) 108 (90 Base) MCG/ACT inhaler Inhale 2 puffs into the lungs every 6 (six) hours as needed for wheezing or shortness of breath. 12/03/16   [name], MD  ALPRAZolam [name]) 1 MG tablet Take 0.5-1 tablets (0.5-1 mg total) by mouth 2 (two) times daily as needed for anxiety. 06/19/17   [name], MD  Blood Glucose Monitoring Suppl  (ONETOUCH VERIO) W/DEVICE KIT 1 each by Does not apply route 4 (four) times daily. 07/25/14   [name], MD  buPROPion (WELLBUTRIN) 75 MG tablet Take 1 tablet (75 mg total) by mouth 2 (two) times daily. 12/05/16   [name], MD  chlorpheniramine (CHLOR-TRIMETON) 4 MG tablet Take 1 tablet (4 mg total) by mouth 3 (three) times daily as needed for allergies. 10/06/14   [name], MD  clobetasol cream (TEMOVATE) 2.70 % Apply 1 application topically 2 (two) times daily. 01/07/17   [provider]  cyclobenzaprine (FLEXERIL) 5 MG tablet Take 1 tablet (5 mg total) by mouth daily as needed for muscle spasms. 07/03/17   [name], MD  DULoxetine (CYMBALTA) 60 MG capsule Take 1 capsule (60 mg total) by mouth 2 (two) times daily. 01/12/17   [name],  MD  ferrous gluconate (FERGON) 324 MG tablet Take 1 tablet (324 mg total) by mouth 2 (two) times daily with a meal. Patient taking differently: Take 324 mg by mouth daily with breakfast.  12/05/16   [name], MD  fluticasone (FLONASE) 50 MCG/ACT nasal spray Place 2 sprays into both nostrils daily. 09/14/17   [name], MD  Fluticasone-Salmeterol (ADVAIR DISKUS) 500-50 MCG/DOSE AEPB Inhale 1 puff into the lungs 2 (two) times daily. 11/06/16   [name], MD  furosemide (LASIX) 80 MG tablet Take 1 tablet (80 mg total) by mouth every morning AND 0.5 tablets (40 mg total) every evening. 08/18/17   [name], MD  gabapentin (NEURONTIN) 300 MG capsule Take 2 capsules (600 mg total) by mouth 3 (three) times daily. 09/14/17   [name], MD  glucose blood (ONETOUCH VERIO) test strip 1 each by Other route QID. Use as instructed 11/12/16   [name], MD  HYDROcodone-acetaminophen (NORCO/VICODIN) 5-325 MG tablet Take 1-2 tablets by mouth every 6 (six) hours as needed for severe pain. 09/01/17   [name], MD  insulin aspart (NOVOLOG FLEXPEN) 100 UNIT/ML FlexPen Inject 15 Units into the skin 4 (four) times daily  -  before meals and at bedtime. 09/14/17   [name], MD  Insulin Detemir (LEVEMIR FLEXTOUCH) 100 UNIT/ML Pen Inject 37 Units into the skin 2 (two) times daily. 06/19/17   [name], MD  Insulin Pen Needle 32G X 4 MM MISC Use to inject insulin up to 6 times a day 08/04/17   [name], MD  Insulin Syringe-Needle U-100 31G X 15/64" 0.5 ML MISC Use to inject insulin up to 4 times a day 04/17/16   [name], MD  Lancets Misc. (ACCU-CHEK FASTCLIX LANCET) KIT Check your blood 4 times a day dx code 250.00 insulin requiring 01/19/13   [name], MD  metoprolol succinate (TOPROL-XL) 25 MG 24 hr tablet Take 1 tablet (25 mg total) by mouth daily. 06/11/16   [name], MD  nystatin cream (MYCOSTATIN) Apply 1 application topically 2 (two) times daily. 12/31/16   [provider]  omeprazole (PRILOSEC) 40 MG capsule Take 1 capsule (40 mg total) by mouth 2 (two) times daily. 06/19/17   [name], MD  OXYGEN Inhale 2 L into the lungs continuous.    [provider]  potassium chloride SA (K-DUR,KLOR-CON) 20 MEQ tablet Take 1 tablet (20 mEq total) by mouth 2 (two) times daily. 08/14/17   [name], MD  predniSONE (DELTASONE) 10 MG tablet Take 2 tablets (20 mg total) by mouth daily. 10/01/17   Valarie Merino, MD  promethazine (PHENERGAN) 12.5 MG tablet Take 1 tablet (12.5 mg total) by mouth every 8 (eight) hours as needed for nausea or vomiting. 07/13/15   [name], DO  rosuvastatin (CRESTOR) 20 MG tablet Take 1 tablet (20 mg total) by mouth at bedtime. 07/20/17   [name], MD  tiotropium (SPIRIVA HANDIHALER) 18 MCG inhalation capsule Place 1 capsule (18 mcg total) into inhaler and inhale daily. 07/20/17   [name], MD  triamcinolone cream (KENALOG) 0.1 % Apply 1 application topically 2 (two) times daily. 12/31/16   [provider]  warfarin (COUMADIN) 2.5 MG tablet TAKE 1 TABLET BY MOUTH DAILY AT 6 PM EXCEPT ON MONDAYS 08/14/17   [name], MD    Family History Family History  Problem Relation Age of Onset  . Peptic Ulcer Disease Father   . Heart attack Father 40  Died of MI at age 12  . Heart attack Brother 51       Died of MI at age 12  . Obesity Brother   . Pneumonia Mother   . Healthy Sister   . Lupus Daughter   . Obsessive Compulsive Disorder Daughter     Social History Social History   Tobacco Use  . Smoking status: Current Every Day Smoker    Packs/day: 1.00    Years: 54.00    Pack years: 54.00    Types: Cigarettes    Start date: 11/01/1962  . Smokeless tobacco: Never Used  . Tobacco comment: 03/26/2017 "cutting back; not trying to quit; was smoking 2 ppd"  Substance Use Topics  . Alcohol use: No    Alcohol/week: 0.0 standard drinks  . Drug use: No     Allergies   Lorazepam; Morphine and related; Oxycontin [oxycodone]; and Tramadol hcl   Review of Systems Review of Systems  Respiratory: Negative for shortness of breath.   Cardiovascular: Negative for chest pain.  Gastrointestinal: Negative for abdominal pain.  Neurological: Negative for headaches.  All other systems reviewed and are negative.    Physical Exam Updated Vital Signs BP 129/75   Pulse 91   Temp 97.8 F (36.6 C) (Oral)   Resp 18   SpO2 98%   Physical Exam  Constitutional: She is oriented to person, place, and time. She appears well-developed and well-nourished. No distress.  HENT:  Head: Normocephalic and atraumatic.  Mouth/Throat: Oropharynx is clear and moist.  Eyes: Pupils are equal, round, and reactive to light. Conjunctivae and EOM are normal.  Neck: Normal range of motion. Neck supple.  Cardiovascular: Normal rate, regular rhythm and normal heart sounds.  Pulmonary/Chest: Effort normal. No respiratory distress.  Mild diffuse expiratory wheezes   Abdominal: Soft. She exhibits no distension. There is no tenderness.  Musculoskeletal: Normal range of motion. She exhibits no edema or deformity.    Neurological: She is alert and oriented to person, place, and time.  Skin: Skin is warm and dry.  Psychiatric: She has a normal mood and affect.  Nursing note and vitals reviewed.    ED Treatments / Results  Labs (all labs ordered are listed, but only abnormal results are displayed) Labs Reviewed  BASIC METABOLIC PANEL - Abnormal; Notable for the following components:      Result Value   Glucose, Bld 180 (*)    Calcium 8.8 (*)    GFR calc non Af Amer 59 (*)    All other components within normal limits  CBC WITH DIFFERENTIAL/PLATELET - Abnormal; Notable for the following components:   RBC 3.69 (*)    Hemoglobin 11.2 (*)    MCV 100.5 (*)    All other components within normal limits  PROTIME-INR - Abnormal; Notable for the following components:   Prothrombin Time 21.2 (*)    All other components within normal limits    EKG None  Radiology Dg Chest 2 View  Result Date: 10/01/2017 CLINICAL DATA:  67 y/o  F; shortness of breath. EXAM: CHEST - 2 VIEW COMPARISON:  04/22/2016 chest radiograph FINDINGS: Stable cardiac silhouette given projection and technique. Sternotomy wires are aligned. Aortic atherosclerosis with calcification. Partially visualized right clavicle fixation plate. Pulmonary venous hypertension. Probable mild interstitial edema. No consolidation. No pleural effusion or pneumothorax. No acute osseous abnormality is evident. IMPRESSION: Pulmonary venous hypertension and probable mild interstitial edema. No consolidation. Electronically Signed   By: Kristine Garbe M.D.   On: 10/01/2017 21:54  Dg Lumbar Spine Complete  Result Date: 10/01/2017 CLINICAL DATA:  Multiple falls. EXAM: LUMBAR SPINE - COMPLETE 4+ VIEW COMPARISON:  MRI of the abdomen Jun 25, 2017 FINDINGS: There is no evidence of lumbar spine fracture. Alignment is normal. Osteopenia. Intervertebral disc spaces are maintained. Calcific atherosclerosis aortoiliac vessels. IMPRESSION: 1. No acute fracture  deformity or malalignment. No advanced degenerative change for age. 2.  Aortic Atherosclerosis (ICD10-I70.0). Electronically Signed   By: Elon Alas M.D.   On: 10/01/2017 21:55   Dg Pelvis 1-2 Views  Result Date: 10/01/2017 CLINICAL DATA:  Multiple falls EXAM: PELVIS - 1-2 VIEW COMPARISON:  CT 10/07/2016 FINDINGS: No SI joint widening. Pubic symphysis and rami appear intact. No fracture or malalignment. IMPRESSION: No acute osseous abnormality. Electronically Signed   By: Donavan Foil M.D.   On: 10/01/2017 21:54   Ct Head Wo Contrast  Result Date: 10/01/2017 CLINICAL DATA:  Multiple recent falls as patient is on Coumadin. EXAM: CT HEAD WITHOUT CONTRAST CT CERVICAL SPINE WITHOUT CONTRAST TECHNIQUE: Multidetector CT imaging of the head and cervical spine was performed following the standard protocol without intravenous contrast. Multiplanar CT image reconstructions of the cervical spine were also generated. COMPARISON:  Head CT 04/18/2016 and head and cervical spine CT 05/23/2011 FINDINGS: CT HEAD FINDINGS Brain: Ventricles, cisterns and other CSF spaces are normal. There is no mass, mass effect, shift of midline structures or acute hemorrhage. No evidence of acute infarction. Minimal basal ganglia calcifications. Vascular: No hyperdense vessel or unexpected calcification. Skull: Normal. Negative for fracture or focal lesion. Sinuses/Orbits: Orbits are normal. There is moderate opacification over the left maxillary sinus. Other: None. CT CERVICAL SPINE FINDINGS Alignment: Normal. Skull base and vertebrae: Mild spondylosis throughout the cervical spine. Vertebral body heights are maintained. Atlantoaxial articulation is within normal. No significant neural foraminal narrowing. No acute fracture or subluxation. Soft tissues and spinal canal: No prevertebral fluid or swelling. No visible canal hematoma. Disc levels:  Subtle disc space narrowing at the C5-6 level. Upper chest: Negative. Other: None.  IMPRESSION: No acute brain injury. No acute cervical spine injury. Mild spondylosis of the cervical spine with minimal disc disease at the C5-6 level. Chronic inflammatory change of the left maxillary sinus. Electronically Signed   By: Marin Olp M.D.   On: 10/01/2017 20:02   Ct Cervical Spine Wo Contrast  Result Date: 10/01/2017 CLINICAL DATA:  Multiple recent falls as patient is on Coumadin. EXAM: CT HEAD WITHOUT CONTRAST CT CERVICAL SPINE WITHOUT CONTRAST TECHNIQUE: Multidetector CT imaging of the head and cervical spine was performed following the standard protocol without intravenous contrast. Multiplanar CT image reconstructions of the cervical spine were also generated. COMPARISON:  Head CT 04/18/2016 and head and cervical spine CT 05/23/2011 FINDINGS: CT HEAD FINDINGS Brain: Ventricles, cisterns and other CSF spaces are normal. There is no mass, mass effect, shift of midline structures or acute hemorrhage. No evidence of acute infarction. Minimal basal ganglia calcifications. Vascular: No hyperdense vessel or unexpected calcification. Skull: Normal. Negative for fracture or focal lesion. Sinuses/Orbits: Orbits are normal. There is moderate opacification over the left maxillary sinus. Other: None. CT CERVICAL SPINE FINDINGS Alignment: Normal. Skull base and vertebrae: Mild spondylosis throughout the cervical spine. Vertebral body heights are maintained. Atlantoaxial articulation is within normal. No significant neural foraminal narrowing. No acute fracture or subluxation. Soft tissues and spinal canal: No prevertebral fluid or swelling. No visible canal hematoma. Disc levels:  Subtle disc space narrowing at the C5-6 level. Upper chest: Negative. Other: None. IMPRESSION:  No acute brain injury. No acute cervical spine injury. Mild spondylosis of the cervical spine with minimal disc disease at the C5-6 level. Chronic inflammatory change of the left maxillary sinus. Electronically Signed   By: Marin Olp M.D.   On: 10/01/2017 20:02    Procedures Procedures (including critical care time)  Medications Ordered in ED Medications  HYDROcodone-acetaminophen (NORCO/VICODIN) 5-325 MG per tablet 1 tablet (1 tablet Oral Given 10/01/17 2109)     Initial Impression / Assessment and Plan / ED Course  I have reviewed the triage vital signs and the nursing notes.  Pertinent labs & imaging results that were available during my care of the patient were reviewed by me and considered in my medical decision making (see chart for details).     MDM  Screen complete  Patient is presenting post a mechanical fall.  Exam does not suggest significant acute traumatic injury.  CT and plain film imaging also does not show significant traumatic injury.  Patient with a long-standing history of asthma and bronchospasm.  Her breathing is improved following treatment in the ED.  She desires discharge home.  Close follow-up was stressed.  Strict return precautions given and understood.   Final Clinical Impressions(s) / ED Diagnoses   Final diagnoses:  Fall, initial encounter  Injury of head, initial encounter  Bronchospasm    ED Discharge Orders         Ordered    predniSONE (DELTASONE) 10 MG tablet  Daily     10/01/17 2305           Valarie Merino, MD 10/01/17 2337

## 2017-10-02 NOTE — Telephone Encounter (Signed)
Patient made aware not to start prednisone. Agreed to [facility] visit. Sched 10/07/2017 at 1315. Instructed to bring all meds with her for review. States she will. She will also keep PCP appt 11/13/2017. Hubbard Hartshorn, RN, BSN

## 2017-10-02 NOTE — Telephone Encounter (Addendum)
Patient called in following ED visit yesterday. States she was given steroid injection in ambulance and blood sugars now in 300s. Sent home with rx for prednisone which she does not want to take as it will increase her BSs. Wants to know PCP's opinion on this. Reports h/a 9/10 at present. No tenderness to touch at right temple. States head pain is worse on left side. Wants PCP to know she cannot sleep more than 3 hours per night and that is why she is so tired during day. Stopped flexeril after only taking one week as it didn't help. Has started taking only half tab of xanax after getting home from hospital last night. States when she takes vicodin it is always 2 tabs (10 mg total) as this has always been her dose. States she has looked into Assisted Living facilities but would be $3000/month which she cannot afford. Patient given first available appt with PCP but is not till 11/13/2017. Offered [facility] appt to go over meds but she does not have transportation at this time as sister and daughter are out of town. She also prefers to see PCP only. Hubbard Hartshorn, RN, BSN

## 2017-10-02 NOTE — Telephone Encounter (Signed)
Review of ED documentation from last night does not indicate an absolute indication for the prednisone.  I agree with Ms. Foulk that she can stop the medication given the elevated sugars.  I also agree that when she has transportation she should be seen in the [facility] for re-evaluation and medication reconciliation.  She will need to bring in all of her medications for review.  Unfortunately, I do not believe this can wait until October and I would appreciate her thinking about an [facility] appointment when she has transportation.  Please pass this message on to her.  Thank You.

## 2017-10-07 ENCOUNTER — Ambulatory Visit: Payer: PPO | Admitting: Pharmacist

## 2017-10-07 ENCOUNTER — Other Ambulatory Visit: Payer: Self-pay

## 2017-10-07 ENCOUNTER — Ambulatory Visit (INDEPENDENT_AMBULATORY_CARE_PROVIDER_SITE_OTHER): Payer: PPO | Admitting: Internal Medicine

## 2017-10-07 VITALS — BP 144/45 | HR 77 | Temp 98.2°F | Ht 62.0 in | Wt 199.1 lb

## 2017-10-07 DIAGNOSIS — E1140 Type 2 diabetes mellitus with diabetic neuropathy, unspecified: Secondary | ICD-10-CM

## 2017-10-07 DIAGNOSIS — Y92009 Unspecified place in unspecified non-institutional (private) residence as the place of occurrence of the external cause: Secondary | ICD-10-CM

## 2017-10-07 DIAGNOSIS — R011 Cardiac murmur, unspecified: Secondary | ICD-10-CM

## 2017-10-07 DIAGNOSIS — Z79899 Other long term (current) drug therapy: Secondary | ICD-10-CM | POA: Diagnosis not present

## 2017-10-07 DIAGNOSIS — Z794 Long term (current) use of insulin: Principal | ICD-10-CM

## 2017-10-07 DIAGNOSIS — Z0000 Encounter for general adult medical examination without abnormal findings: Secondary | ICD-10-CM

## 2017-10-07 DIAGNOSIS — Z23 Encounter for immunization: Secondary | ICD-10-CM | POA: Diagnosis not present

## 2017-10-07 DIAGNOSIS — Z9181 History of falling: Secondary | ICD-10-CM

## 2017-10-07 DIAGNOSIS — W19XXXD Unspecified fall, subsequent encounter: Secondary | ICD-10-CM

## 2017-10-07 DIAGNOSIS — R609 Edema, unspecified: Secondary | ICD-10-CM

## 2017-10-07 NOTE — Progress Notes (Signed)
CC: Medication Reconciliation  HPI:  [name] is a 67 y.o. with past medical history documented below presents for medication reconciliation. Please see problem based charting for evaluation, assessment, and plan.  Past Medical History:  Diagnosis Date  . Adenomatous polyps 05/14/2011   Colonoscopy (05/2011): 4 mm adenomatous polyp excised endoscopically Colonoscopy (02/2002): Adenomatous polyp excised endoscopically   . Allergic rhinitis 06/01/2012  . Anemia of chronic disease 01/01/2013  . Anxiety 07/24/2010  . Aortic atherosclerosis ([place]) 10/19/2014   Seen on CT scan, currently asymptomatic  . 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 Asymptomatic cholelithiasis 09/25/2015   Seen on CT scan 08/2015  . Carotid artery stenosis    s/p right endarterectomy (06/2010) Carotid US (07/2010):  Left: Moderate-to-severe (60-79%) calcific and non-calcific plaque origin and proximal ICA and ECA   . Chronic congestive heart failure with left ventricular diastolic dysfunction ([place]) 10/21/2010  . Chronic constipation 02/03/2011  . Chronic daily headache 01/16/2014  . Chronic low back pain 10/06/2012  . Chronic venous insufficiency 08/04/2012  . Clear cell renal cell carcinoma ([place]) 07/21/2011   s/p cryoablation of left RCC in 09/2011 by [name]. Followed by [name]  [facility]) .    Marland Kitchen COPD (chronic obstructive pulmonary disease) with emphysema (HCC)    PFTs 02/2012: FEV1 0.92 (40%), ratio 69, 27% increase in FEV1 with BD, TLC 91%, severe airtrapping, DLCO49% On chronic home O2. Pulmonary rehab referral 05/2012   . Depression 11/19/2005  . Esophageal stenosis 08/08/2015   Mild, benign-appearing on EGD 06/29/2015  . Fibromyalgia 08/29/2010  . Gastroesophageal reflux disease   . History of blood transfusion    "several times"   . Hyperlipidemia LDL goal < 100 11/20/2005  . Internal hemorrhoids  08/04/2012  . Lichen sclerosus of female genitalia 01/12/2017  . Mitral stenosis    s/p Mitral valve replacement with a 27-mm pericardial porcine valve (Medtronic Mosaic valve, serial #[id number] on 09/20/10, Dr. Prescott Gum)   . Moderate to severe pulmonary hypertension ([place])    2014 TEE w PA peak pressure 46 mmHg, s/p MV replacement   . Moderately severe major depression ([place]) 11/19/2005  . Obesity (BMI 30.0-34.9) 10/23/2011  . Obstructive sleep apnea    Nocturnal polysomnography (06/2009): Moderate sleep apnea/ hypopnea syndrome , AHI 17.8 per hour with nonpositional hypopneas. CPAP titration to 12 CWP, AHI 2.4 per hour. On nocturnal CPAP via a small resMed Quattro full-face mask with heated humidifier.   . On home oxygen therapy    "2.5L; anytime I sleep; day or night" (03/26/2017)  . OSA on CPAP   . Osteoporosis    DEXA (12/09/2011): L-spine T -3.7, left hip T -1.4 DEXA (12/2004): L-spine T -2.6, left hip -0.1   . Paroxysmal atrial fibrillation ([place]) 10/22/2010   s/p Left atrial maze procedure for paroxysmal atrial fibrillation on 09/20/2010 by Dr Prescott Gum.  Subsequent splenic infarct, decision was made to re-anticoagulate with coumadin, likely life-long as this is the most likely cause of the splenic infarct.   . Pulmonary hypertension due to chronic obstructive pulmonary disease ([place]) 04/25/2016  . Right nephrolithiasis 09/06/2014   5 mm non-obstructing calculus seen on CT scan 09/05/2014   . Seborrheic keratosis 09/28/2015  . Severe obesity (BMI 35.0-39.9) with comorbidity ([place]) 10/23/2011  . Shortness of breath dyspnea   . Tobacco abuse 07/28/2012  . Type 2 diabetes mellitus with diabetic neuropathy (HCC)    Review of  Systems:    Denies nausea/vomiting, abdominal pain, headaches, dizziness  Physical Exam:  Vitals:   10/07/17 1319  BP: (!) 144/45  Pulse: 77  Temp: 98.2 F (36.8 C)  TempSrc: Oral  SpO2: 98%  Weight: 199 lb 1.6 oz (90.3 kg)  Height: _0  (1.575 m)   Physical Exam    Constitutional: She appears well-developed and well-nourished. No distress.  HENT:  Head: Normocephalic and atraumatic.  Eyes: Conjunctivae are normal.  Cardiovascular: Normal rate and regular rhythm.  Murmur (sytolic) heard. Respiratory: Effort normal and breath sounds normal. No respiratory distress. She has no wheezes.  GI: Soft. Bowel sounds are normal. She exhibits no distension. There is no tenderness.  Musculoskeletal: She exhibits edema.  Neurological: She is alert. No cranial nerve deficit.  Skin: She is not diaphoretic.  Psychiatric: She has a normal mood and affect. Her behavior is normal. Thought content normal.    Assessment & Plan:   See Encounters Tab for problem based charting.  Patient discussed with Dr. Lynnae January

## 2017-10-07 NOTE — Patient Instructions (Addendum)
It was a pleasure to see you today [name].I am sorry to hear about your recent fall. Today we went through your medications and took out/changed medications that are more prone to making you fall. Please make the following changes:  -Please stop taking flexeril and chlorpheniramine -Please decrease your gabapentin dose to 2 tablets daily -Follow up with Dr. [name] in 1 week to review your continuous blood glucose monitor readings. Your follow up with Dr. Eppie Gibson is on October 4th  -Please follow the falls precautions we discussd  If you have any questions or concerns, please call our clinic at [phone] between 9am-5pm and after hours call [phone] and ask for the internal medicine resident on call. If you feel you are having a medical emergency please call 911.   Thank you, we look forward to help you remain healthy!  Lars Mage, MD Internal Medicine PGY2   Fall Prevention in the [facility] can cause injuries. They can happen to people of all ages. There are many things you can do to make your home safe and to help prevent falls. What can I do on the outside of my home?  Regularly fix the edges of walkways and driveways and fix any cracks.  Remove anything that might make you trip as you walk through a door, such as a raised step or threshold.  Trim any bushes or trees on the path to your home.  Use bright outdoor lighting.  Clear any walking paths of anything that might make someone trip, such as rocks or tools.  Regularly check to see if handrails are loose or broken. Make sure that both sides of any steps have handrails.  Any raised decks and porches should have guardrails on the edges.  Have any leaves, snow, or ice cleared regularly.  Use sand or salt on walking paths during winter.  Clean up any spills in your garage right away. This includes oil or grease spills. What can I do in the bathroom?  Use night lights.  Install grab bars by the toilet and in the  tub and shower. Do not use towel bars as grab bars.  Use non-skid mats or decals in the tub or shower.  If you need to sit down in the shower, use a plastic, non-slip stool.  Keep the floor dry. Clean up any water that spills on the floor as soon as it happens.  Remove soap buildup in the tub or shower regularly.  Attach bath mats securely with double-sided non-slip rug tape.  Do not have throw rugs and other things on the floor that can make you trip. What can I do in the bedroom?  Use night lights.  Make sure that you have a light by your bed that is easy to reach.  Do not use any sheets or blankets that are too big for your bed. They should not hang down onto the floor.  Have a firm chair that has side arms. You can use this for support while you get dressed.  Do not have throw rugs and other things on the floor that can make you trip. What can I do in the kitchen?  Clean up any spills right away.  Avoid walking on wet floors.  Keep items that you use a lot in easy-to-reach places.  If you need to reach something above you, use a strong step stool that has a grab bar.  Keep electrical cords out of the way.  Do not use floor polish or  wax that makes floors slippery. If you must use wax, use non-skid floor wax.  Do not have throw rugs and other things on the floor that can make you trip. What can I do with my stairs?  Do not leave any items on the stairs.  Make sure that there are handrails on both sides of the stairs and use them. Fix handrails that are broken or loose. Make sure that handrails are as long as the stairways.  Check any carpeting to make sure that it is firmly attached to the stairs. Fix any carpet that is loose or worn.  Avoid having throw rugs at the top or bottom of the stairs. If you do have throw rugs, attach them to the floor with carpet tape.  Make sure that you have a light switch at the top of the stairs and the bottom of the stairs. If you  do not have them, ask someone to add them for you. What else can I do to help prevent falls?  Wear shoes that: ? Do not have high heels. ? Have rubber bottoms. ? Are comfortable and fit you well. ? Are closed at the toe. Do not wear sandals.  If you use a stepladder: ? Make sure that it is fully opened. Do not climb a closed stepladder. ? Make sure that both sides of the stepladder are locked into place. ? Ask someone to hold it for you, if possible.  Clearly mark and make sure that you can see: ? Any grab bars or handrails. ? First and last steps. ? Where the edge of each step is.  Use tools that help you move around (mobility aids) if they are needed. These include: ? Canes. ? Walkers. ? Scooters. ? Crutches.  Turn on the lights when you go into a dark area. Replace any light bulbs as soon as they burn out.  Set up your furniture so you have a clear path. Avoid moving your furniture around.  If any of your floors are uneven, fix them.  If there are any pets around you, be aware of where they are.  Review your medicines with your doctor. Some medicines can make you feel dizzy. This can increase your chance of falling. Ask your doctor what other things that you can do to help prevent falls. This information is not intended to replace advice given to you by your health care provider. Make sure you discuss any questions you have with your health care provider. Document Released: 11/23/2008 Document Revised: 07/05/2015 Document Reviewed: 03/03/2014 Elsevier Interactive Patient Education  [employer].  Please record the time, amount and what food drinks and activities you have while wearing the continuous glucose monitor(CGM) in the folder provided.  Bring the folder with you to follow up appointments  Do not have a CT or an MRI while wearing the CGM.   Please make an appointment for 1 week with me and a doctor for the first of two CGM downloads..   You will also  return in 2 weeks to have your second download and the CGM removed.

## 2017-10-07 NOTE — Progress Notes (Signed)
Freestyle Libre Pro CGM sensor placed and started. Patient was educated about wearing sensor, keeping food, activity and medication log and when to call office. Follow up was arranged with the patient.

## 2017-10-07 NOTE — Progress Notes (Addendum)
Patient was seen today in a co-visit with Dr. Maricela Bo, we collaborated on de-prescribing.  Freestyle Libre Pro CGM sensor placed and started. Patient was educated about wearing sensor, keeping food, activity and medication log and when to call office. Follow up was arranged with the patient.   LOT [id number] SN [id number] 01/09/2018

## 2017-10-08 ENCOUNTER — Other Ambulatory Visit: Payer: Self-pay | Admitting: Internal Medicine

## 2017-10-08 DIAGNOSIS — W19XXXD Unspecified fall, subsequent encounter: Secondary | ICD-10-CM | POA: Insufficient documentation

## 2017-10-08 DIAGNOSIS — Y92009 Unspecified place in unspecified non-institutional (private) residence as the place of occurrence of the external cause: Secondary | ICD-10-CM | POA: Insufficient documentation

## 2017-10-08 DIAGNOSIS — J439 Emphysema, unspecified: Secondary | ICD-10-CM

## 2017-10-08 NOTE — Assessment & Plan Note (Signed)
Patient was given Influenza vaccination during this visit

## 2017-10-08 NOTE — Assessment & Plan Note (Signed)
The patient had a recent fall that she was evaluated in the ED for a fall on 10/01/17. The fall was determined to be mechanical in nature. CT head was negative for any acute brain injury.  The patient was requested to be seen in [facility] for medication reconciliation.   Assessment and plan  The patient's medication was reviewed with pharmacy and her medication list was appropriate with the medication in her medication bag.   I also reviewed the patient's medication for any centrally acting medications and medications on beer's list. She has not been using flexeril so it was removed from her medication list. Her chlorpheniramine was changed to loratadine and her gabapentin dose was decreased to 431m daily.

## 2017-10-09 ENCOUNTER — Telehealth: Payer: Self-pay

## 2017-10-09 MED ORDER — FLUTICASONE-SALMETEROL 250-50 MCG/DOSE IN AEPB: 1.0000 | INHALATION_SPRAY | Freq: Two times a day (BID) | RESPIRATORY_TRACT | 3 refills | Status: DC

## 2017-10-09 NOTE — Telephone Encounter (Signed)
Requesting a call back.

## 2017-10-09 NOTE — Telephone Encounter (Signed)
Plan at last visit was to de-escalate dose.  Will do so at this time.

## 2017-10-09 NOTE — Telephone Encounter (Signed)
Next appt scheduled 10/4 with PCP.

## 2017-10-09 NOTE — Progress Notes (Signed)
Internal Medicine Clinic Attending  Case discussed with Dr. Maricela Bo at the time of the visit.  We reviewed the resident's history and exam and pertinent patient test results.  I agree with the assessment, diagnosis, and plan of care documented in the resident's note.

## 2017-10-15 ENCOUNTER — Other Ambulatory Visit: Payer: Self-pay

## 2017-10-15 ENCOUNTER — Ambulatory Visit (INDEPENDENT_AMBULATORY_CARE_PROVIDER_SITE_OTHER): Payer: PPO | Admitting: Internal Medicine

## 2017-10-15 ENCOUNTER — Ambulatory Visit: Payer: PPO | Admitting: Pharmacist

## 2017-10-15 VITALS — BP 140/75 | HR 78 | Temp 98.1°F | Ht 62.0 in | Wt 200.0 lb

## 2017-10-15 DIAGNOSIS — R51 Headache: Secondary | ICD-10-CM

## 2017-10-15 DIAGNOSIS — M545 Low back pain: Secondary | ICD-10-CM

## 2017-10-15 DIAGNOSIS — Z79899 Other long term (current) drug therapy: Secondary | ICD-10-CM | POA: Diagnosis not present

## 2017-10-15 DIAGNOSIS — Z794 Long term (current) use of insulin: Secondary | ICD-10-CM

## 2017-10-15 DIAGNOSIS — R109 Unspecified abdominal pain: Secondary | ICD-10-CM

## 2017-10-15 DIAGNOSIS — G8929 Other chronic pain: Secondary | ICD-10-CM | POA: Diagnosis not present

## 2017-10-15 DIAGNOSIS — E1140 Type 2 diabetes mellitus with diabetic neuropathy, unspecified: Secondary | ICD-10-CM | POA: Diagnosis not present

## 2017-10-15 DIAGNOSIS — R339 Retention of urine, unspecified: Secondary | ICD-10-CM

## 2017-10-15 DIAGNOSIS — Z79891 Long term (current) use of opiate analgesic: Secondary | ICD-10-CM

## 2017-10-15 MED ORDER — LIRAGLUTIDE 18 MG/3ML ~~LOC~~ SOPN: 0.6000 mg | PEN_INJECTOR | Freq: Every morning | SUBCUTANEOUS | 0 refills | Status: DC

## 2017-10-15 MED ORDER — KETOROLAC TROMETHAMINE 60 MG/2ML IM SOLN
60.0000 mg | Freq: Once | INTRAMUSCULAR | Status: AC
Start: 2017-10-15 — End: 2017-10-15
  Administered 2017-10-15: 60 mg via INTRAMUSCULAR

## 2017-10-15 NOTE — Progress Notes (Signed)
I saw and evaluated the patient. I personally confirmed the key portions of Dr. Jerlyn Ly history and exam and reviewed the CGM results with her. The assessment, diagnosis, and plan were formulated together and I agree with the documentation in the resident's note and I agree with the changes being made to her diabetic regimen based upon our review of the CGM results.

## 2017-10-15 NOTE — Patient Instructions (Addendum)
Your new diabetes medication regiment: 1. Levemir to 37 units once daily 2. Novolog 15 units three times a day with meals and at night 3. Victoza 0.6 mg daily   I have put in a referral to a urologist. They should be calling you within the next several weeks.

## 2017-10-15 NOTE — Assessment & Plan Note (Signed)
Assessment: [name] is presenting today with a 7 month history of urinary retention without any other urinary symptoms. Her symptoms may be due to a neurogenic bladder secondary to her T2DM. She is also on multiple medications that have been known to cause urinary retention (tiatropium, alprazolam, and hydrocodone). She is not interested in stopping any of these medications today. I think it is reasonable refer her to urology in order to evaluate her urinary retention and explore other treatment modalities.   Plan: 1. Refer to urologist

## 2017-10-15 NOTE — Progress Notes (Signed)
CC: CGM follow-up  HPI:  [name] is a 67 y.o. is a Type 2 diabetic with the past medical history below who presents for follow-up of continuous glucose montitoring placement.  T2DM Patients current diabetic regiment:  Levemir 37 units BID Novolog 15 units TID before meals and bedtime  [name] wore the CGM for 9 days. The average reading was 153, % time in target was 71, % time below target was 3, and % time above target was. 26. Intervention will be to decrease levemir to 37 units once daily instead of twice daily, continue Novolog 15 units TID with meals, and add Victoza 0.6 mg daily. The patient will be scheduled to see Dr. [name] for a final appointment.   Urinary Retention [name] reports a 7 month history of urinary retention. She states that the she has had issues with "wanting to pee" and "not being able to." These symptoms began during her last admission in February, 2019. She says that during this admission they had to use an in and out cath to help her urinate. Since then she says that she "sits on the toilet for 20 minutes" before she is able to urinate. She denies hematuria, dysuria, increased frequency. She is interested in being referred to a urologist for further work-up.   Chronic Pain Patient reports a generalized pain throughout the day today. She says that the pain is worse in her back. Her pain is similar to what she has been experiencing intermittently throughout the past. She normally takes Hydrocodone-acetaminophen 5/325 as needed for her pain. She says that she only takes 1-2 tablets per day. She did not take any pain medications today. She says that she would like something for the pain so that "she can go to [place]" with her sister.    Past Medical History:  Diagnosis Date  . Adenomatous polyps 05/14/2011   Colonoscopy (05/2011): 4 mm adenomatous polyp excised endoscopically Colonoscopy (02/2002): Adenomatous polyp excised endoscopically   .  Allergic rhinitis 06/01/2012  . Anemia of chronic disease 01/01/2013  . Anxiety 07/24/2010  . Aortic atherosclerosis ([place]) 10/19/2014   Seen on CT scan, currently asymptomatic  . 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 Asymptomatic cholelithiasis 09/25/2015   Seen on CT scan 08/2015  . Carotid artery stenosis    s/p right endarterectomy (06/2010) Carotid US (07/2010):  Left: Moderate-to-severe (60-79%) calcific and non-calcific plaque origin and proximal ICA and ECA   . Chronic congestive heart failure with left ventricular diastolic dysfunction ([place]) 10/21/2010  . Chronic constipation 02/03/2011  . Chronic daily headache 01/16/2014  . Chronic low back pain 10/06/2012  . Chronic venous insufficiency 08/04/2012  . Clear cell renal cell carcinoma ([place]) 07/21/2011   s/p cryoablation of left RCC in 09/2011 by [name]. Followed by [name]  [facility]) .    Marland Kitchen COPD (chronic obstructive pulmonary disease) with emphysema (HCC)    PFTs 02/2012: FEV1 0.92 (40%), ratio 69, 27% increase in FEV1 with BD, TLC 91%, severe airtrapping, DLCO49% On chronic home O2. Pulmonary rehab referral 05/2012   . Depression 11/19/2005  . Esophageal stenosis 08/08/2015   Mild, benign-appearing on EGD 06/29/2015  . Fibromyalgia 08/29/2010  . Gastroesophageal reflux disease   . History of blood transfusion    "several times"   . Hyperlipidemia LDL goal < 100 11/20/2005  . Internal hemorrhoids 08/04/2012  . Lichen sclerosus of female genitalia 01/12/2017  .  Mitral stenosis    s/p Mitral valve replacement with a 27-mm pericardial porcine valve (Medtronic Mosaic valve, serial #[id number] on 09/20/10, Dr. Prescott Gum)   . Moderate to severe pulmonary hypertension ([place])    2014 TEE w PA peak pressure 46 mmHg, s/p MV replacement   . Moderately severe major depression ([place]) 11/19/2005  . Obesity (BMI 30.0-34.9) 10/23/2011  . Obstructive  sleep apnea    Nocturnal polysomnography (06/2009): Moderate sleep apnea/ hypopnea syndrome , AHI 17.8 per hour with nonpositional hypopneas. CPAP titration to 12 CWP, AHI 2.4 per hour. On nocturnal CPAP via a small resMed Quattro full-face mask with heated humidifier.   . On home oxygen therapy    "2.5L; anytime I sleep; day or night" (03/26/2017)  . OSA on CPAP   . Osteoporosis    DEXA (12/09/2011): L-spine T -3.7, left hip T -1.4 DEXA (12/2004): L-spine T -2.6, left hip -0.1   . Paroxysmal atrial fibrillation ([place]) 10/22/2010   s/p Left atrial maze procedure for paroxysmal atrial fibrillation on 09/20/2010 by Dr Prescott Gum.  Subsequent splenic infarct, decision was made to re-anticoagulate with coumadin, likely life-long as this is the most likely cause of the splenic infarct.   . Pulmonary hypertension due to chronic obstructive pulmonary disease ([place]) 04/25/2016  . Right nephrolithiasis 09/06/2014   5 mm non-obstructing calculus seen on CT scan 09/05/2014   . Seborrheic keratosis 09/28/2015  . Severe obesity (BMI 35.0-39.9) with comorbidity ([place]) 10/23/2011  . Shortness of breath dyspnea   . Tobacco abuse 07/28/2012  . Type 2 diabetes mellitus with diabetic neuropathy ([place])    Review of Systems:  Review of Systems  Constitutional: Negative for chills and fever.  HENT: Negative for congestion and sore throat.   Respiratory: Negative for cough and shortness of breath.   Cardiovascular: Negative for chest pain and palpitations.  Gastrointestinal: Positive for abdominal pain. Negative for constipation, diarrhea, nausea and vomiting.  Genitourinary: Negative for dysuria, frequency, hematuria and urgency.       Urinary retention  Musculoskeletal:       Generalized pain, back pain  Skin: Negative for rash.  Neurological: Positive for headaches.  All other systems reviewed and are negative.   Physical Exam:  Vitals:   10/15/17 1325  BP: 140/75  Pulse: 78  Temp: 98.1 F (36.7 C)  TempSrc:  Oral  SpO2: 95%  Weight: 200 lb (90.7 kg)  Height: _0  (1.575 m)   Physical Exam  Constitutional: She appears well-developed and well-nourished.  Obese female sitting up in wheel-chair in no acute distress  HENT:  Head: Normocephalic and atraumatic.  Eyes: EOM are normal.  Neck: Normal range of motion.  Cardiovascular: Normal rate and regular rhythm.  Pulmonary/Chest: Effort normal and breath sounds normal.  Breathing normally on supplemental oxygen.   Musculoskeletal: Normal range of motion.  Neurological: She is alert.  Skin: Skin is warm and dry.  Psychiatric: She has a normal mood and affect. Her behavior is normal.  Nursing note and vitals reviewed.   Assessment & Plan:   See Encounters Tab for problem based charting.  Patient seen with Dr. Eppie Gibson

## 2017-10-15 NOTE — Assessment & Plan Note (Signed)
Assessment: [name] is an insulin-dependant type 2 diabetic. Her current insulin diabetic regiment Levemir 37 units BID and Novolog 15 units TID before meals and bedtime. She had a continuous glucose monitor placed on 8/28. She wore the CGM for 9 days. The average reading was 153, % time in target was 71, % time below target was 3, and % time above target was 26. Intervention will be to decrease levemir to 37 units once daily instead of twice daily, continue Novolog 15 units TID with meals, and add Victoza 0.6 mg daily.  Plan:  1. Decrease levemir to 37 units once daily 2. Continue Novolog 15 units TID with meals 3. Add Victoza 0.6 mg daily.

## 2017-10-15 NOTE — Progress Notes (Signed)
Co-visit with Dr. Alfonse Spruce. Refer to her note for information.  Acie Fredrickson, PharmD Candidate

## 2017-10-15 NOTE — Assessment & Plan Note (Signed)
Assessment: [name] is having low back pain today and has not taken any of her pain medications. We will give her an injection of Toradol today to improve her symptoms.  Plan: 1. Toradol 60 mg IM once

## 2017-10-19 ENCOUNTER — Other Ambulatory Visit: Payer: Self-pay | Admitting: Pharmacist

## 2017-10-19 DIAGNOSIS — Z748 Other problems related to care provider dependency: Secondary | ICD-10-CM

## 2017-10-19 DIAGNOSIS — M545 Low back pain: Principal | ICD-10-CM

## 2017-10-19 DIAGNOSIS — G8929 Other chronic pain: Secondary | ICD-10-CM

## 2017-10-19 NOTE — Patient Outreach (Signed)
[facility]) Care Management  10/19/2017  [name] December 02, 1950 [id number]   Incoming call from [name] in response to the [facility] Medication Adherence Campaign. Speak with patient. HIPAA identifiers verified and verbal consent received.  [name] reports that she takes her rosuvastatin 20 mg once daily as directed. Reports that she rarely misses a dose of the medication. Denies any barriers such as side effects of cost. Counsel patient on the importance of medication adherence. Patient describes in detail her system for remembering to take her medications each day.   Reports that her sister has always been her transportation to appointments in the past, but that her sister is having health troubles of her own and that she needs help with finding another transportation option now. Reports that she has used [employer] in the past. Patient expresses interest in speaking with one of our Social Workers about options.  Patient reports that she has difficulty with mobility even around her home due to ongoing pain, even limiting her ability to prepare food for herself. Patient reports that she is interested in speaking with one of our [name] nurses.  [name] denies any medication questions/concerns at this time and reports that she meets with [name], PharmD at her doctor's office for medication concerns. Provide patient with my phone number as well.   PLAN  1) Will send referral to [place] Social Worker for assistance with evaluating transportation resource options.  2) Will send referral to [place] to request RNCM meet patient for a home visit to for further evaluation, including of patient's mobility issue around her home.  3) Will close pharmacy episode at this time.  Harlow Asa, PharmD, [place] Management [phone]

## 2017-10-19 NOTE — Addendum Note (Signed)
Addended by: Harlow Asa A on: 10/19/2017 10:33 AM   Modules accepted: Orders

## 2017-10-19 NOTE — Patient Outreach (Signed)
[place] [facility]) Care Management  10/19/2017  [name] [birth year] [id number]   Outreach call to [name] regarding her request for follow up from the [facility] Medication Adherence Campaign. Left a HIPAA compliant message on the patient's voicemail.  Harlow Asa, PharmD, [place] Management [phone]

## 2017-10-21 ENCOUNTER — Ambulatory Visit: Payer: PPO

## 2017-10-21 ENCOUNTER — Ambulatory Visit: Payer: PPO | Admitting: Pharmacist

## 2017-10-21 ENCOUNTER — Other Ambulatory Visit: Payer: Self-pay

## 2017-10-21 DIAGNOSIS — G4733 Obstructive sleep apnea (adult) (pediatric): Secondary | ICD-10-CM | POA: Diagnosis not present

## 2017-10-21 DIAGNOSIS — J439 Emphysema, unspecified: Secondary | ICD-10-CM | POA: Diagnosis not present

## 2017-10-21 DIAGNOSIS — I509 Heart failure, unspecified: Secondary | ICD-10-CM | POA: Diagnosis not present

## 2017-10-21 DIAGNOSIS — J449 Chronic obstructive pulmonary disease, unspecified: Secondary | ICD-10-CM | POA: Diagnosis not present

## 2017-10-21 NOTE — Addendum Note (Signed)
Addended by: Forde Dandy on: 10/21/2017 01:21 PM   Modules accepted: Orders

## 2017-10-21 NOTE — Patient Outreach (Signed)
[facility]) Care Management  10/21/2017  [name] Nov 02, 1950 [id number]   Initial outreach to [name] regarding social work referral for transportation resources.  [name] reported that she has previously been eligible for [employer] services but rarely used it.  She did not renew with [employer] because her sister was able to provide all transportation for several years.  Her sister now has health issues of her own and is unable to do so.  BSW educated [name] about the [employer] and [employer].  [name] said that she would like to apply for [employer] again. [employer] application was completed and faxed to eligibility.  BSW will follow up with her next week to determine status of application and if face to face interview has been scheduled.   Ronn Melena, BSW Social Worker [phone]

## 2017-10-22 ENCOUNTER — Other Ambulatory Visit: Payer: Self-pay | Admitting: *Deleted

## 2017-10-22 NOTE — Patient Outreach (Signed)
[facility]) Care Management  10/22/2017  [name] 21-Jan-1951 [id number]   Referral received from [facility] pharmacist as member has complained of problems with mobility due to pain.  Per chart, she has history of pulmonary hypertension, heart failure, atrial fibrillation, emphysema, diabetes, fibromyalgia, and hyperlipidemia.  Call placed to member to introduce self and attempt to schedule home visit.  No answer, HIPAA compliant voice message left.  Unsuccessful outreach letter sent to member, will follow up within the next 4 business days.  Valente David, [place], MSN [place] [phone]

## 2017-10-26 ENCOUNTER — Other Ambulatory Visit: Payer: Self-pay | Admitting: *Deleted

## 2017-10-26 NOTE — Patient Outreach (Signed)
[facility]) Care Management  10/26/2017  [name] 1950-11-03 [id number]   2nd attempt made to contact member to schedule home assessment.  She report she is doing well but has multiple chronic medical conditions she is in need of assistance managing.  She denies any urgent concerns, agrees to home visit next week.  Will perform home assessment and develop individualized care plan at that time.  Valente David, [place], MSN [place] [phone]

## 2017-10-28 ENCOUNTER — Ambulatory Visit: Payer: Self-pay

## 2017-10-28 ENCOUNTER — Other Ambulatory Visit: Payer: Self-pay

## 2017-10-28 NOTE — Patient Outreach (Signed)
[place]) Care Management  10/28/2017  PAULETTA PICKNEY 1950-05-01 [id number]   BSW attempted to contact Ms. Kuch to follow up on status of [employer] application submitted on 10/21/17.  BSW left voicemail message.  Will attempt to reach her again within four business days.    Ronn Melena, BSW Social Worker [phone]

## 2017-10-29 DIAGNOSIS — I509 Heart failure, unspecified: Secondary | ICD-10-CM | POA: Diagnosis not present

## 2017-10-29 DIAGNOSIS — J449 Chronic obstructive pulmonary disease, unspecified: Secondary | ICD-10-CM | POA: Diagnosis not present

## 2017-10-30 ENCOUNTER — Other Ambulatory Visit: Payer: Self-pay | Admitting: Internal Medicine

## 2017-10-30 DIAGNOSIS — Z794 Long term (current) use of insulin: Principal | ICD-10-CM

## 2017-10-30 DIAGNOSIS — E1140 Type 2 diabetes mellitus with diabetic neuropathy, unspecified: Secondary | ICD-10-CM

## 2017-10-30 NOTE — Progress Notes (Signed)
Working with patient on DM med management per Dr. Eppie Gibson approval. Patient is tolerating liraglutide 1.2 mg daily (stopped insulin detemir). Patient reports BG in the 100s, none < 70, only 2 > 200 this past week. For further benefit, including CV, increased liraglutide to 1.8 mg daily. Will contact patient next week for follow up. Patient verbalized understanding. Prescription for liraglutide sent to [facility] pharmacy.

## 2017-11-02 ENCOUNTER — Other Ambulatory Visit: Payer: Self-pay

## 2017-11-02 IMAGING — CR DG CHEST 1V PORT
1 series · 1 of 1 positions shown · non-contrast
Comparison: 10/25/2014

CLINICAL DATA: Shortness of breath for 6 weeks. Cough for 3 days.
Intermittent chest pain.

EXAM:
PORTABLE CHEST 1 VIEW

[AP]
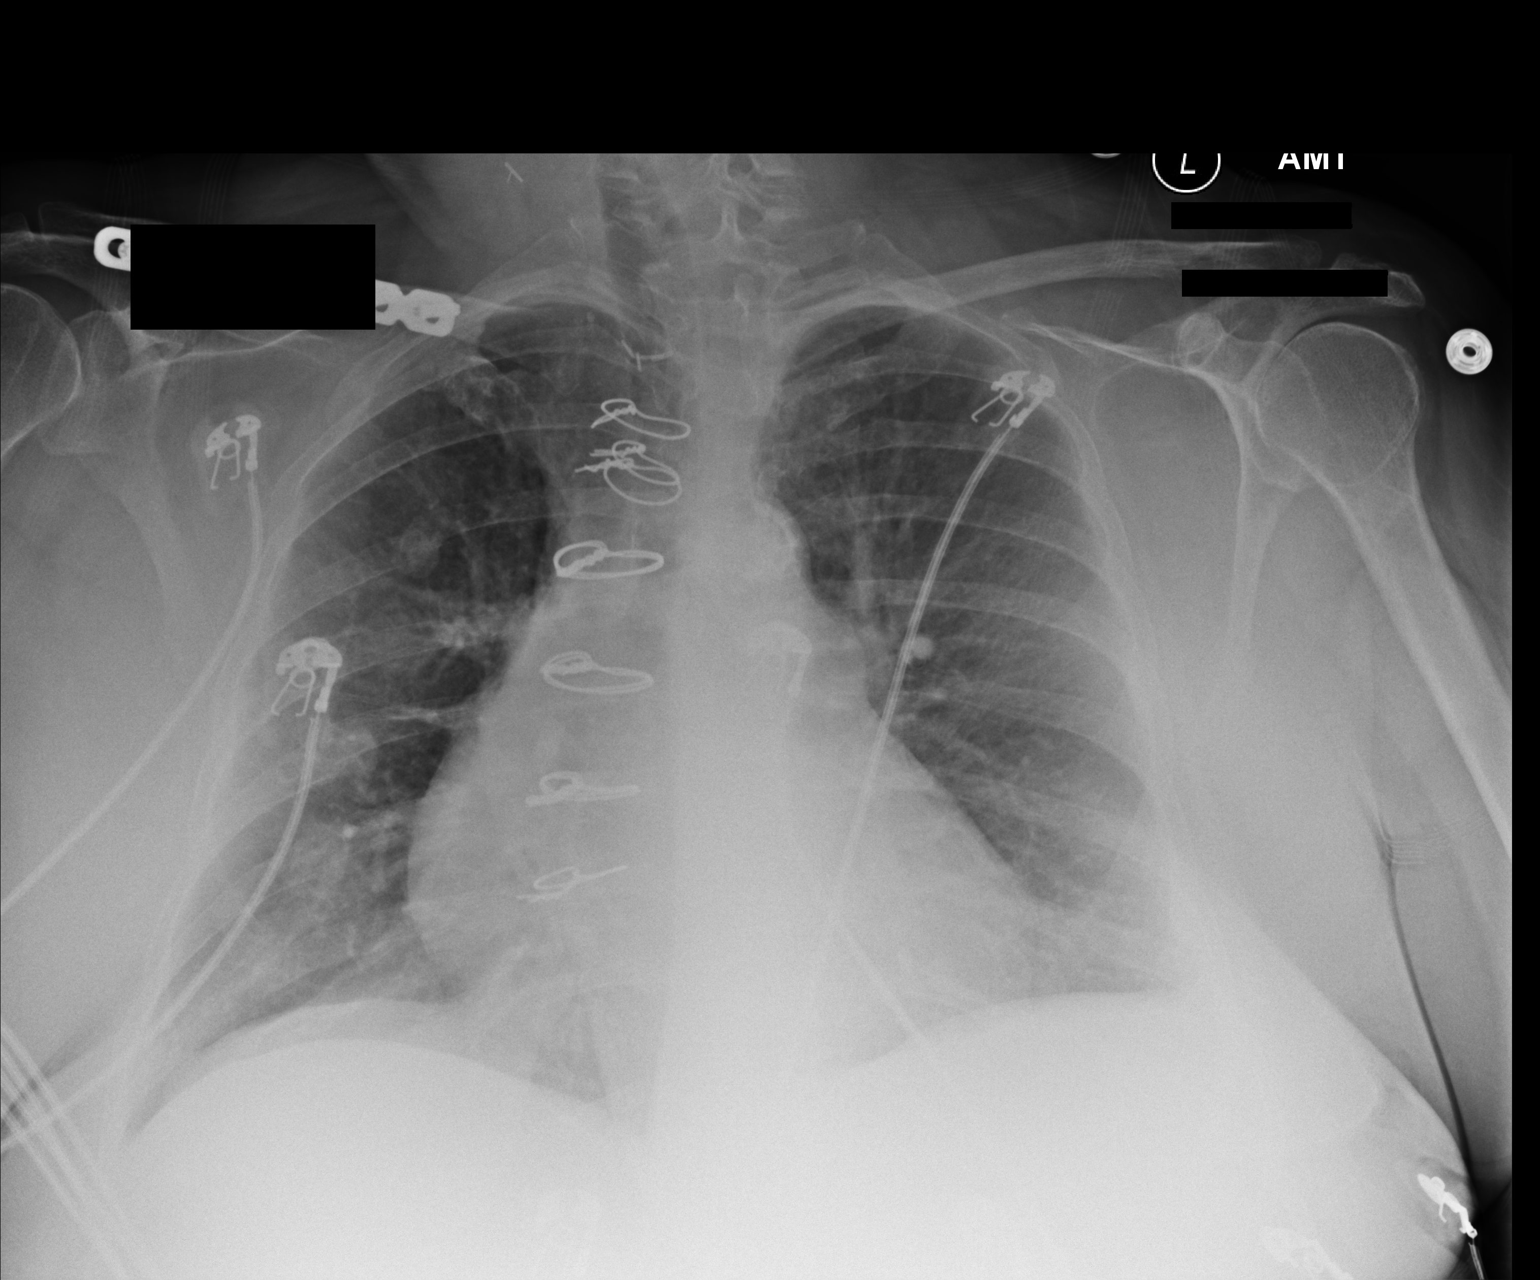

[1 of 1 positions shown; findings below may reference images not displayed]

FINDINGS: Mild cardiomegaly stable. Both lungs are clear. No evidence of
pneumothorax or pleural effusion. Prior median sternotomy noted.
Fixation plate and screws noted in the right clavicle. Old right
posterior rib fracture deformities also noted.
IMPRESSION: Stable mild cardiomegaly.  No active lung disease.

## 2017-11-02 NOTE — Patient Outreach (Signed)
[facility]) Care Management  11/02/2017  JLYNN LY [birth year] [id number]   Follow up call to Ms. Ehrmann regarding status of [employer] application.  Ms. Beightol said that she has not been contacted to schedule the interview.  BSW agreed to call Courtney at [employer] to check on status.  BSW left voicemail message with Loma Sousa requesting that she contact Ms. Adel to schedule or me if more information is needed.  BSW will follow up with Ms. Kuba again next week.   Ronn Melena, BSW Social Worker [phone]

## 2017-11-04 ENCOUNTER — Other Ambulatory Visit: Payer: Self-pay

## 2017-11-04 NOTE — Patient Outreach (Signed)
[place] [facility]) Care Management  11/04/2017  [name] [birth year] [id number]   Encounter opened in error

## 2017-11-04 NOTE — Patient Outreach (Signed)
[facility]) Care Management  11/04/2017  [name] Jul 07, 1950 [id number]  BSW received return call from [place] at [place]. She reported that [name] is still eligible for transportation services through 12/10/20 so she does not need to complete the in person interview again. BSW called [name] and informed her of this.  BSW is closing case at this time due to no other social work needs being identified.    Ronn Melena, BSW Social Worker [phone]

## 2017-11-05 ENCOUNTER — Encounter: Payer: Self-pay | Admitting: *Deleted

## 2017-11-05 ENCOUNTER — Other Ambulatory Visit: Payer: Self-pay | Admitting: *Deleted

## 2017-11-05 ENCOUNTER — Other Ambulatory Visit: Payer: Self-pay | Admitting: Internal Medicine

## 2017-11-05 DIAGNOSIS — Z794 Long term (current) use of insulin: Secondary | ICD-10-CM

## 2017-11-05 DIAGNOSIS — J439 Emphysema, unspecified: Secondary | ICD-10-CM

## 2017-11-05 DIAGNOSIS — E1140 Type 2 diabetes mellitus with diabetic neuropathy, unspecified: Secondary | ICD-10-CM

## 2017-11-05 DIAGNOSIS — I480 Paroxysmal atrial fibrillation: Secondary | ICD-10-CM

## 2017-11-05 DIAGNOSIS — F322 Major depressive disorder, single episode, severe without psychotic features: Secondary | ICD-10-CM

## 2017-11-05 NOTE — Patient Outreach (Signed)
[name]) Care Management   11/05/2017  [name] 09-05-1950 [id number]  [name] is an 67 y.o. female  Subjective:   Member alert and oriented x3, complains of chronic pain, history of fibromyalgia.  Pain in her legs has caused a fall in August, denies falls otherwise.  Compliant with medications, active with pharmacist at Internal Medicine clinic for medication management.  Next visit with clinic 10/4.  Objective:   Review of Systems  Constitutional: Negative.   HENT: Negative.   Eyes: Negative.   Respiratory: Negative.   Cardiovascular: Negative.   Gastrointestinal: Negative.   Genitourinary: Negative.   Musculoskeletal: Positive for joint pain and myalgias.  Skin: Negative.   Neurological: Negative.   Endo/Heme/Allergies: Negative.   Psychiatric/Behavioral: Negative.     Physical Exam  Constitutional: She is oriented to person, place, and time. She appears well-developed and well-nourished.  Neck: Normal range of motion.  Cardiovascular: Normal rate, regular rhythm and normal heart sounds.  Respiratory: Effort normal and breath sounds normal.  GI: Soft. Bowel sounds are normal.  Neurological: She is alert and oriented to person, place, and time.  Skin: Skin is warm and dry.   BP 128/62 (BP Location: Right Arm, Patient Position: Sitting, Cuff Size: Normal)   Pulse 70   Resp 20   Ht 1.575 m (_0 )   Wt 200 lb (90.7 kg)   SpO2 95%   BMI 36.58 kg/m   Encounter Medications:   Outpatient Encounter Medications as of 11/05/2017  Medication Sig  . albuterol (PROVENTIL HFA;VENTOLIN HFA) 108 (90 Base) MCG/ACT inhaler Inhale 2 puffs into the lungs every 6 (six) hours as needed for wheezing or shortness of breath.  . ALPRAZolam (XANAX) 1 MG tablet Take 0.5-1 tablets (0.5-1 mg total) by mouth 2 (two) times daily as needed for anxiety.  . Blood Glucose Monitoring Suppl (ONETOUCH VERIO) W/DEVICE KIT 1 each by Does not apply route 4 (four)  times daily.  Marland Kitchen buPROPion (WELLBUTRIN) 75 MG tablet Take 1 tablet (75 mg total) by mouth 2 (two) times daily.  . clobetasol cream (TEMOVATE) 9.51 % Apply 1 application topically 2 (two) times daily.  . DULoxetine (CYMBALTA) 60 MG capsule Take 1 capsule (60 mg total) by mouth 2 (two) times daily.  . ferrous gluconate (FERGON) 324 MG tablet Take 1 tablet (324 mg total) by mouth 2 (two) times daily with a meal. (Patient taking differently: Take 324 mg by mouth daily with breakfast. )  . fluticasone (FLONASE) 50 MCG/ACT nasal spray Place 2 sprays into both nostrils daily.  . Fluticasone-Salmeterol (ADVAIR DISKUS) 250-50 MCG/DOSE AEPB Inhale 1 puff into the lungs 2 (two) times daily.  . Fluticasone-Salmeterol (ADVAIR DISKUS) 500-50 MCG/DOSE AEPB Inhale 1 puff into the lungs 2 (two) times daily.  . furosemide (LASIX) 80 MG tablet Take 1 tablet (80 mg total) by mouth every morning AND 0.5 tablets (40 mg total) every evening.  . gabapentin (NEURONTIN) 300 MG capsule Take 2 capsules (600 mg total) by mouth 3 (three) times daily. (Patient taking differently: Take 600 mg by mouth 2 (two) times daily. )  . glucose blood (ONETOUCH VERIO) test strip 1 each by Other route QID. Use as instructed  . HYDROcodone-acetaminophen (NORCO/VICODIN) 5-325 MG tablet Take 1-2 tablets by mouth every 6 (six) hours as needed for severe pain.  Marland Kitchen insulin aspart (NOVOLOG FLEXPEN) 100 UNIT/ML FlexPen Inject 15 Units into the skin 4 (four) times daily -  before meals and at bedtime.  . Insulin  Pen Needle 32G X 4 MM MISC Use to inject insulin up to 6 times a day  . Insulin Syringe-Needle U-100 31G X 15/64" 0.5 ML MISC Use to inject insulin up to 4 times a day  . Lancets Misc. (ACCU-CHEK FASTCLIX LANCET) KIT Check your blood 4 times a day dx code 250.00 insulin requiring  . liraglutide (VICTOZA) 18 MG/3ML SOPN Inject 0.3 mLs (1.8 mg total) into the skin daily.  Marland Kitchen loratadine (CLARITIN) 10 MG tablet Take 10 mg by mouth daily as needed for  allergies.  . metoprolol succinate (TOPROL-XL) 25 MG 24 hr tablet Take 1 tablet (25 mg total) by mouth daily.  Marland Kitchen nystatin cream (MYCOSTATIN) Apply 1 application topically 2 (two) times daily.  Marland Kitchen omeprazole (PRILOSEC) 40 MG capsule Take 1 capsule (40 mg total) by mouth 2 (two) times daily.  . OXYGEN Inhale 2 L into the lungs continuous.  . potassium chloride SA (K-DUR,KLOR-CON) 20 MEQ tablet Take 1 tablet (20 mEq total) by mouth 2 (two) times daily.  . rosuvastatin (CRESTOR) 20 MG tablet Take 1 tablet (20 mg total) by mouth at bedtime.  Marland Kitchen tiotropium (SPIRIVA HANDIHALER) 18 MCG inhalation capsule Place 1 capsule (18 mcg total) into inhaler and inhale daily.  Marland Kitchen triamcinolone cream (KENALOG) 0.1 % Apply 1 application topically 2 (two) times daily.  Marland Kitchen warfarin (COUMADIN) 2.5 MG tablet TAKE 1 TABLET BY MOUTH DAILY AT 6 PM EXCEPT ON MONDAYS   No facility-administered encounter medications on file as of 11/05/2017.     Functional Status:   In your present state of health, do you have any difficulty performing the following activities: 10/15/2017 10/07/2017  Hearing? N N  Vision? N N  Difficulty concentrating or making decisions? N N  Walking or climbing stairs? Y Y  Comment - -  Dressing or bathing? N N  Doing errands, shopping? N N  Comment - -  Some recent data might be hidden    Fall/Depression Screening:    Fall Risk  11/05/2017 10/15/2017 10/07/2017  Falls in the past year? Yes Yes Yes  Number falls in past yr: 2 or more 2 or more 2 or more  Comment - - -  Injury with Fall? Yes Yes Yes  Risk Factor Category  High Fall Risk High Fall Risk High Fall Risk  Risk for fall due to : History of fall(s) - History of fall(s);Impaired balance/gait;Impaired mobility  Risk for fall due to: Comment - - -  Follow up Falls prevention discussed;Education provided - Falls prevention discussed  Comment - - -   PHQ 2/9 Scores 11/05/2017 10/15/2017 10/07/2017 07/03/2017 04/16/2017 03/26/2017 01/12/2017  PHQ - 2 Score  _0 0 5  PHQ- 9 Score _1 - 16    Assessment:    Met with member at scheduled time.  Consent obtained.  Assessment complete.  Member report pain to be her biggest concern.  State she is allergic to many pain medications, taking Vicodin with little relief.  This care manager inquired about involvement with pain clinic, denies stating "they're not going to do anything but give me more medicine.  I don't want to take anymore."  She does feel that PT will help with mobility but is hesitant to discuss with MD.  Advised to discuss concern during visit next week so assessment can be done and decision made regarding order for PT.  She verbalizes understanding.    Diabetes controlled, A1C - 7.0, report blood sugars are  typically in the 100s.  Blood pressure controlled during last few visits.  History of COPD, home O2 when needed, however continue to smoke.  Advised against smoking with oxygen in the home, discussed smoking cessation.  State she has no desire to quit at this time.    Member had concerns regarding transportation, resolved with BSW involvement.  Denies any concerns regarding medication management.  No community nursing needs identified, would benefit from involvement with health coach, agrees to referral.  Plan:   Will place referral to health coach.  Valente David, [place], MSN [place] [phone]

## 2017-11-09 ENCOUNTER — Ambulatory Visit: Payer: PPO

## 2017-11-13 ENCOUNTER — Encounter: Payer: Self-pay | Admitting: Internal Medicine

## 2017-11-13 ENCOUNTER — Other Ambulatory Visit: Payer: Self-pay

## 2017-11-13 ENCOUNTER — Encounter (HOSPITAL_COMMUNITY): Payer: Self-pay | Admitting: General Practice

## 2017-11-13 ENCOUNTER — Encounter: Payer: PPO | Admitting: Internal Medicine

## 2017-11-13 ENCOUNTER — Ambulatory Visit (HOSPITAL_COMMUNITY)
Admission: RE | Admit: 2017-11-13 | Discharge: 2017-11-13 | Disposition: A | Discharge status: Home / Self Care | Payer: PPO | Source: Ambulatory Visit | Attending: Internal Medicine | Admitting: Internal Medicine

## 2017-11-13 ENCOUNTER — Inpatient Hospital Stay (HOSPITAL_COMMUNITY)
Admission: AD | Admit: 2017-11-13 | Discharge: 2017-11-18 | DRG: 191 | Disposition: A | Discharge status: Skilled Nursing Facility | Payer: PPO | Source: Ambulatory Visit | Attending: Oncology | Admitting: Oncology

## 2017-11-13 ENCOUNTER — Ambulatory Visit (INDEPENDENT_AMBULATORY_CARE_PROVIDER_SITE_OTHER): Payer: PPO | Admitting: Internal Medicine

## 2017-11-13 VITALS — BP 135/51 | HR 67 | Temp 99.0°F | Wt 193.5 lb

## 2017-11-13 DIAGNOSIS — J209 Acute bronchitis, unspecified: Secondary | ICD-10-CM

## 2017-11-13 DIAGNOSIS — Z7901 Long term (current) use of anticoagulants: Secondary | ICD-10-CM

## 2017-11-13 DIAGNOSIS — R0602 Shortness of breath: Secondary | ICD-10-CM | POA: Diagnosis not present

## 2017-11-13 DIAGNOSIS — Q2546 Tortuous aortic arch: Secondary | ICD-10-CM

## 2017-11-13 DIAGNOSIS — J441 Chronic obstructive pulmonary disease with (acute) exacerbation: Secondary | ICD-10-CM | POA: Insufficient documentation

## 2017-11-13 DIAGNOSIS — J440 Chronic obstructive pulmonary disease with acute lower respiratory infection: Secondary | ICD-10-CM | POA: Diagnosis not present

## 2017-11-13 DIAGNOSIS — E1140 Type 2 diabetes mellitus with diabetic neuropathy, unspecified: Secondary | ICD-10-CM | POA: Diagnosis present

## 2017-11-13 DIAGNOSIS — E785 Hyperlipidemia, unspecified: Secondary | ICD-10-CM

## 2017-11-13 DIAGNOSIS — T380X5A Adverse effect of glucocorticoids and synthetic analogues, initial encounter: Secondary | ICD-10-CM | POA: Diagnosis not present

## 2017-11-13 DIAGNOSIS — K219 Gastro-esophageal reflux disease without esophagitis: Secondary | ICD-10-CM | POA: Diagnosis present

## 2017-11-13 DIAGNOSIS — I110 Hypertensive heart disease with heart failure: Secondary | ICD-10-CM

## 2017-11-13 DIAGNOSIS — Z953 Presence of xenogenic heart valve: Secondary | ICD-10-CM | POA: Diagnosis not present

## 2017-11-13 DIAGNOSIS — I050 Rheumatic mitral stenosis: Secondary | ICD-10-CM | POA: Diagnosis not present

## 2017-11-13 DIAGNOSIS — Z952 Presence of prosthetic heart valve: Secondary | ICD-10-CM

## 2017-11-13 DIAGNOSIS — Z743 Need for continuous supervision: Secondary | ICD-10-CM | POA: Diagnosis not present

## 2017-11-13 DIAGNOSIS — F322 Major depressive disorder, single episode, severe without psychotic features: Secondary | ICD-10-CM | POA: Diagnosis present

## 2017-11-13 DIAGNOSIS — I5032 Chronic diastolic (congestive) heart failure: Secondary | ICD-10-CM

## 2017-11-13 DIAGNOSIS — R32 Unspecified urinary incontinence: Secondary | ICD-10-CM

## 2017-11-13 DIAGNOSIS — Z85528 Personal history of other malignant neoplasm of kidney: Secondary | ICD-10-CM

## 2017-11-13 DIAGNOSIS — Z9981 Dependence on supplemental oxygen: Secondary | ICD-10-CM

## 2017-11-13 DIAGNOSIS — F419 Anxiety disorder, unspecified: Secondary | ICD-10-CM | POA: Diagnosis present

## 2017-11-13 DIAGNOSIS — Z7951 Long term (current) use of inhaled steroids: Secondary | ICD-10-CM

## 2017-11-13 DIAGNOSIS — Z885 Allergy status to narcotic agent status: Secondary | ICD-10-CM

## 2017-11-13 DIAGNOSIS — J206 Acute bronchitis due to rhinovirus: Secondary | ICD-10-CM | POA: Diagnosis not present

## 2017-11-13 DIAGNOSIS — Z79891 Long term (current) use of opiate analgesic: Secondary | ICD-10-CM

## 2017-11-13 DIAGNOSIS — D500 Iron deficiency anemia secondary to blood loss (chronic): Secondary | ICD-10-CM

## 2017-11-13 DIAGNOSIS — E662 Morbid (severe) obesity with alveolar hypoventilation: Secondary | ICD-10-CM | POA: Diagnosis not present

## 2017-11-13 DIAGNOSIS — Z794 Long term (current) use of insulin: Secondary | ICD-10-CM | POA: Diagnosis not present

## 2017-11-13 DIAGNOSIS — M810 Age-related osteoporosis without current pathological fracture: Secondary | ICD-10-CM | POA: Diagnosis not present

## 2017-11-13 DIAGNOSIS — R279 Unspecified lack of coordination: Secondary | ICD-10-CM | POA: Diagnosis not present

## 2017-11-13 DIAGNOSIS — Z9989 Dependence on other enabling machines and devices: Secondary | ICD-10-CM

## 2017-11-13 DIAGNOSIS — G4733 Obstructive sleep apnea (adult) (pediatric): Secondary | ICD-10-CM

## 2017-11-13 DIAGNOSIS — E1165 Type 2 diabetes mellitus with hyperglycemia: Secondary | ICD-10-CM | POA: Diagnosis not present

## 2017-11-13 DIAGNOSIS — Z6835 Body mass index (BMI) 35.0-35.9, adult: Secondary | ICD-10-CM | POA: Diagnosis not present

## 2017-11-13 DIAGNOSIS — B348 Other viral infections of unspecified site: Secondary | ICD-10-CM | POA: Diagnosis not present

## 2017-11-13 DIAGNOSIS — I50810 Right heart failure, unspecified: Secondary | ICD-10-CM | POA: Diagnosis not present

## 2017-11-13 DIAGNOSIS — R531 Weakness: Secondary | ICD-10-CM | POA: Diagnosis not present

## 2017-11-13 DIAGNOSIS — R7989 Other specified abnormal findings of blood chemistry: Secondary | ICD-10-CM | POA: Diagnosis not present

## 2017-11-13 DIAGNOSIS — I2510 Atherosclerotic heart disease of native coronary artery without angina pectoris: Secondary | ICD-10-CM

## 2017-11-13 DIAGNOSIS — Q2733 Arteriovenous malformation of digestive system vessel: Secondary | ICD-10-CM | POA: Diagnosis not present

## 2017-11-13 DIAGNOSIS — M797 Fibromyalgia: Secondary | ICD-10-CM | POA: Diagnosis not present

## 2017-11-13 DIAGNOSIS — M6281 Muscle weakness (generalized): Secondary | ICD-10-CM | POA: Diagnosis not present

## 2017-11-13 DIAGNOSIS — I480 Paroxysmal atrial fibrillation: Secondary | ICD-10-CM | POA: Diagnosis not present

## 2017-11-13 DIAGNOSIS — G8929 Other chronic pain: Secondary | ICD-10-CM

## 2017-11-13 DIAGNOSIS — M549 Dorsalgia, unspecified: Secondary | ICD-10-CM

## 2017-11-13 DIAGNOSIS — Z8249 Family history of ischemic heart disease and other diseases of the circulatory system: Secondary | ICD-10-CM

## 2017-11-13 DIAGNOSIS — R2689 Other abnormalities of gait and mobility: Secondary | ICD-10-CM | POA: Diagnosis not present

## 2017-11-13 DIAGNOSIS — I272 Pulmonary hypertension, unspecified: Secondary | ICD-10-CM | POA: Diagnosis not present

## 2017-11-13 DIAGNOSIS — M545 Low back pain, unspecified: Secondary | ICD-10-CM

## 2017-11-13 DIAGNOSIS — Z79899 Other long term (current) drug therapy: Secondary | ICD-10-CM | POA: Diagnosis not present

## 2017-11-13 DIAGNOSIS — Z5181 Encounter for therapeutic drug level monitoring: Secondary | ICD-10-CM | POA: Diagnosis not present

## 2017-11-13 DIAGNOSIS — F17210 Nicotine dependence, cigarettes, uncomplicated: Secondary | ICD-10-CM | POA: Diagnosis not present

## 2017-11-13 DIAGNOSIS — Z888 Allergy status to other drugs, medicaments and biological substances status: Secondary | ICD-10-CM

## 2017-11-13 DIAGNOSIS — F329 Major depressive disorder, single episode, unspecified: Secondary | ICD-10-CM

## 2017-11-13 DIAGNOSIS — Z8669 Personal history of other diseases of the nervous system and sense organs: Secondary | ICD-10-CM

## 2017-11-13 DIAGNOSIS — E119 Type 2 diabetes mellitus without complications: Secondary | ICD-10-CM | POA: Diagnosis not present

## 2017-11-13 DIAGNOSIS — Z7984 Long term (current) use of oral hypoglycemic drugs: Secondary | ICD-10-CM

## 2017-11-13 DIAGNOSIS — I252 Old myocardial infarction: Secondary | ICD-10-CM

## 2017-11-13 DIAGNOSIS — R488 Other symbolic dysfunctions: Secondary | ICD-10-CM | POA: Diagnosis not present

## 2017-11-13 DIAGNOSIS — R41841 Cognitive communication deficit: Secondary | ICD-10-CM | POA: Diagnosis not present

## 2017-11-13 DIAGNOSIS — I2729 Other secondary pulmonary hypertension: Secondary | ICD-10-CM

## 2017-11-13 DIAGNOSIS — R0689 Other abnormalities of breathing: Secondary | ICD-10-CM | POA: Diagnosis not present

## 2017-11-13 HISTORY — DX: Personal history of other diseases of the digestive system: Z87.19

## 2017-11-13 HISTORY — DX: Headache: R51

## 2017-11-13 HISTORY — DX: Personal history of urinary calculi: Z87.442

## 2017-11-13 LAB — CBC WITH DIFFERENTIAL/PLATELET
Abs Immature Granulocytes: 0 10*3/uL (ref 0.0–0.1)
Basophils Absolute: 0 10*3/uL (ref 0.0–0.1)
Basophils Relative: 0 %
Eosinophils Absolute: 0.1 10*3/uL (ref 0.0–0.7)
Eosinophils Relative: 1 %
HCT: 37.4 % (ref 36.0–46.0)
Hemoglobin: 11.3 g/dL — ABNORMAL LOW (ref 12.0–15.0)
Immature Granulocytes: 0 %
Lymphocytes Relative: 16 %
Lymphs Abs: 1.1 10*3/uL (ref 0.7–4.0)
MCH: 29 pg (ref 26.0–34.0)
MCHC: 30.2 g/dL (ref 30.0–36.0)
MCV: 96.1 fL (ref 78.0–100.0)
Monocytes Absolute: 0.7 10*3/uL (ref 0.1–1.0)
Monocytes Relative: 9 %
Neutro Abs: 5.1 10*3/uL (ref 1.7–7.7)
Neutrophils Relative %: 74 %
Platelets: 213 10*3/uL (ref 150–400)
RBC: 3.89 MIL/uL (ref 3.87–5.11)
RDW: 15.4 % (ref 11.5–15.5)
WBC: 6.9 10*3/uL (ref 4.0–10.5)

## 2017-11-13 LAB — BASIC METABOLIC PANEL
Anion gap: 7 (ref 5–15)
BUN: 13 mg/dL (ref 8–23)
CO2: 30 mmol/L (ref 22–32)
Calcium: 9 mg/dL (ref 8.9–10.3)
Chloride: 101 mmol/L (ref 98–111)
Creatinine, Ser: 0.89 mg/dL (ref 0.44–1.00)
GFR calc Af Amer: >60 mL/min (ref >60)
GFR calc non Af Amer: >60 mL/min (ref >60)
Glucose, Bld: 142 mg/dL — ABNORMAL HIGH (ref 70–99)
Potassium: 3.9 mmol/L (ref 3.5–5.1)
Sodium: 138 mmol/L (ref 135–145)

## 2017-11-13 LAB — GLUCOSE, CAPILLARY
Glucose-Capillary: 160 mg/dL — ABNORMAL HIGH (ref 70–99)
Glucose-Capillary: 118 mg/dL — ABNORMAL HIGH (ref 70–99)
Glucose-Capillary: 175 mg/dL — ABNORMAL HIGH (ref 70–99)

## 2017-11-13 LAB — POCT GLYCOSYLATED HEMOGLOBIN (HGB A1C): Hemoglobin A1C: 6.7 % — AB (ref 4.0–5.6)

## 2017-11-13 LAB — POCT INR: INR: 3 (ref 2.0–3.0)

## 2017-11-13 LAB — TSH: TSH: 0.86 u[IU]/mL (ref 0.350–4.500)

## 2017-11-13 MED ORDER — LIRAGLUTIDE 18 MG/3ML ~~LOC~~ SOPN: 1.8000 mg | PEN_INJECTOR | Freq: Every morning | SUBCUTANEOUS | 3 refills | Status: DC

## 2017-11-13 MED ORDER — HYDROCODONE-ACETAMINOPHEN 5-325 MG PO TABS: 2.0000 | ORAL_TABLET | Freq: Four times a day (QID) | ORAL | 0 refills | Status: DC | PRN

## 2017-11-13 MED ORDER — GUAIFENESIN-DM 100-10 MG/5ML PO SYRP
5.0000 mL | ORAL_SOLUTION | ORAL | Status: DC | PRN
  Administered 2017-11-15: 5 mL via ORAL
  Filled 2017-11-13: qty 5

## 2017-11-13 MED ORDER — BENZONATATE 100 MG PO CAPS
100.0000 mg | ORAL_CAPSULE | Freq: Three times a day (TID) | ORAL | Status: DC | PRN
  Administered 2017-11-14 – 2017-11-17 (×8): 100 mg via ORAL
  Filled 2017-11-13 (×8): qty 1

## 2017-11-13 MED ORDER — METHYLPREDNISOLONE SODIUM SUCC 125 MG IJ SOLR
125.0000 mg | Freq: Once | INTRAMUSCULAR | Status: AC
  Administered 2017-11-13: 125 mg via INTRAVENOUS
  Filled 2017-11-13: qty 2

## 2017-11-13 MED ORDER — ROSUVASTATIN CALCIUM 20 MG PO TABS
20.0000 mg | ORAL_TABLET | Freq: Every day | ORAL | Status: DC
  Administered 2017-11-13 – 2017-11-18 (×6): 20 mg via ORAL
  Filled 2017-11-13 (×6): qty 1

## 2017-11-13 MED ORDER — FUROSEMIDE 40 MG PO TABS: 40.0000 mg | ORAL_TABLET | Freq: Every day | ORAL | Status: DC

## 2017-11-13 MED ORDER — INSULIN ASPART 100 UNIT/ML ~~LOC~~ SOLN
0.0000 [IU] | Freq: Three times a day (TID) | SUBCUTANEOUS | Status: DC
  Administered 2017-11-14 (×2): 4 [IU] via SUBCUTANEOUS
  Administered 2017-11-14: 3 [IU] via SUBCUTANEOUS
  Administered 2017-11-15: 11 [IU] via SUBCUTANEOUS
  Administered 2017-11-15 (×2): 7 [IU] via SUBCUTANEOUS
  Administered 2017-11-16 (×2): 20 [IU] via SUBCUTANEOUS
  Administered 2017-11-16: 11 [IU] via SUBCUTANEOUS
  Administered 2017-11-17: 15 [IU] via SUBCUTANEOUS
  Administered 2017-11-17: 7 [IU] via SUBCUTANEOUS
  Administered 2017-11-17: 20 [IU] via SUBCUTANEOUS
  Administered 2017-11-18 (×2): 11 [IU] via SUBCUTANEOUS
  Administered 2017-11-18: 15 [IU] via SUBCUTANEOUS

## 2017-11-13 MED ORDER — NICOTINE 14 MG/24HR TD PT24
14.0000 mg | MEDICATED_PATCH | Freq: Every day | TRANSDERMAL | Status: DC
  Administered 2017-11-13 – 2017-11-18 (×6): 14 mg via TRANSDERMAL
  Filled 2017-11-13 (×6): qty 1

## 2017-11-13 MED ORDER — INSULIN ASPART 100 UNIT/ML ~~LOC~~ SOLN: 6.0000 [IU] | Freq: Three times a day (TID) | SUBCUTANEOUS | Status: DC

## 2017-11-13 MED ORDER — ACETAMINOPHEN 650 MG RE SUPP: 650.0000 mg | Freq: Four times a day (QID) | RECTAL | Status: DC | PRN

## 2017-11-13 MED ORDER — METOPROLOL SUCCINATE ER 25 MG PO TB24
25.0000 mg | ORAL_TABLET | Freq: Every day | ORAL | Status: DC
  Administered 2017-11-14 – 2017-11-18 (×5): 25 mg via ORAL
  Filled 2017-11-13 (×5): qty 1

## 2017-11-13 MED ORDER — LEVOFLOXACIN IN D5W 500 MG/100ML IV SOLN: 500.0000 mg | INTRAVENOUS | Status: DC

## 2017-11-13 MED ORDER — ONDANSETRON HCL 4 MG PO TABS: 8.0000 mg | ORAL_TABLET | Freq: Three times a day (TID) | ORAL | Status: DC | PRN

## 2017-11-13 MED ORDER — METHYLPREDNISOLONE SODIUM SUCC 125 MG IJ SOLR: 80.0000 mg | Freq: Two times a day (BID) | INTRAMUSCULAR | Status: DC

## 2017-11-13 MED ORDER — MOMETASONE FURO-FORMOTEROL FUM 200-5 MCG/ACT IN AERO
2.0000 | INHALATION_SPRAY | Freq: Two times a day (BID) | RESPIRATORY_TRACT | Status: DC
  Administered 2017-11-14 – 2017-11-18 (×10): 2 via RESPIRATORY_TRACT
  Filled 2017-11-13: qty 8.8

## 2017-11-13 MED ORDER — DULOXETINE HCL 60 MG PO CPEP
60.0000 mg | ORAL_CAPSULE | Freq: Two times a day (BID) | ORAL | Status: DC
  Administered 2017-11-13 – 2017-11-18 (×11): 60 mg via ORAL
  Filled 2017-11-13 (×11): qty 1

## 2017-11-13 MED ORDER — INSULIN ASPART 100 UNIT/ML ~~LOC~~ SOLN
0.0000 [IU] | Freq: Every day | SUBCUTANEOUS | Status: DC
  Administered 2017-11-16 – 2017-11-17 (×2): 4 [IU] via SUBCUTANEOUS
  Administered 2017-11-18: 5 [IU] via SUBCUTANEOUS

## 2017-11-13 MED ORDER — GABAPENTIN 300 MG PO CAPS
600.0000 mg | ORAL_CAPSULE | Freq: Two times a day (BID) | ORAL | Status: DC
  Administered 2017-11-13 – 2017-11-18 (×11): 600 mg via ORAL
  Filled 2017-11-13 (×11): qty 2

## 2017-11-13 MED ORDER — TIOTROPIUM BROMIDE MONOHYDRATE 18 MCG IN CAPS: 18.0000 ug | ORAL_CAPSULE | Freq: Every day | RESPIRATORY_TRACT | Status: DC

## 2017-11-13 MED ORDER — IPRATROPIUM-ALBUTEROL 0.5-2.5 (3) MG/3ML IN SOLN
3.0000 mL | Freq: Four times a day (QID) | RESPIRATORY_TRACT | Status: DC
  Administered 2017-11-13 (×2): 3 mL via RESPIRATORY_TRACT
  Filled 2017-11-13 (×2): qty 3

## 2017-11-13 MED ORDER — SODIUM CHLORIDE 0.9 % IV SOLN
500.0000 mg | INTRAVENOUS | Status: DC
  Administered 2017-11-13: 500 mg via INTRAVENOUS
  Filled 2017-11-13 (×2): qty 500

## 2017-11-13 MED ORDER — ALPRAZOLAM 0.5 MG PO TABS
0.5000 mg | ORAL_TABLET | Freq: Two times a day (BID) | ORAL | Status: DC | PRN
  Administered 2017-11-14: 0.5 mg via ORAL
  Filled 2017-11-13: qty 1

## 2017-11-13 MED ORDER — SENNOSIDES-DOCUSATE SODIUM 8.6-50 MG PO TABS: 1.0000 | ORAL_TABLET | Freq: Every evening | ORAL | Status: DC | PRN

## 2017-11-13 MED ORDER — IPRATROPIUM-ALBUTEROL 0.5-2.5 (3) MG/3ML IN SOLN
3.0000 mL | Freq: Four times a day (QID) | RESPIRATORY_TRACT | Status: DC
  Administered 2017-11-13 – 2017-11-14 (×4): 3 mL via RESPIRATORY_TRACT
  Filled 2017-11-13 (×3): qty 3

## 2017-11-13 MED ORDER — BUPROPION HCL 75 MG PO TABS
75.0000 mg | ORAL_TABLET | Freq: Two times a day (BID) | ORAL | Status: DC
  Administered 2017-11-13 – 2017-11-18 (×11): 75 mg via ORAL
  Filled 2017-11-13 (×11): qty 1

## 2017-11-13 MED ORDER — INSULIN ASPART 100 UNIT/ML ~~LOC~~ SOLN
10.0000 [IU] | Freq: Three times a day (TID) | SUBCUTANEOUS | Status: DC
  Administered 2017-11-14 – 2017-11-15 (×6): 10 [IU] via SUBCUTANEOUS

## 2017-11-13 MED ORDER — FUROSEMIDE 80 MG PO TABS
80.0000 mg | ORAL_TABLET | Freq: Every day | ORAL | Status: DC
  Administered 2017-11-13 – 2017-11-18 (×6): 80 mg via ORAL
  Filled 2017-11-13 (×6): qty 1

## 2017-11-13 MED ORDER — HYDROCODONE-ACETAMINOPHEN 5-325 MG PO TABS
2.0000 | ORAL_TABLET | Freq: Four times a day (QID) | ORAL | Status: DC | PRN
  Administered 2017-11-13 – 2017-11-18 (×12): 2 via ORAL
  Filled 2017-11-13 (×12): qty 2

## 2017-11-13 MED ORDER — ALPRAZOLAM 0.5 MG PO TABS: 0.5000 mg | ORAL_TABLET | Freq: Two times a day (BID) | ORAL | Status: DC | PRN

## 2017-11-13 MED ORDER — ALBUTEROL SULFATE (2.5 MG/3ML) 0.083% IN NEBU
2.5000 mg | INHALATION_SOLUTION | RESPIRATORY_TRACT | Status: DC | PRN
  Administered 2017-11-17 (×2): 2.5 mg via RESPIRATORY_TRACT
  Filled 2017-11-13 (×2): qty 3

## 2017-11-13 MED ORDER — ACETAMINOPHEN 325 MG PO TABS
650.0000 mg | ORAL_TABLET | Freq: Four times a day (QID) | ORAL | Status: DC | PRN
  Administered 2017-11-18: 650 mg via ORAL
  Filled 2017-11-13 (×2): qty 2

## 2017-11-13 NOTE — Telephone Encounter (Signed)
HYDROcodone-acetaminophen (NORCO/VICODIN) 5-325 MG tablet, refill request @  [facility] [place], [address] [phone] (Phone) [phone] (Fax)

## 2017-11-13 NOTE — Patient Instructions (Signed)
I am sorry your breathing has worsened recently to the point that you are significantly short of breath.  We are admitting you to the hospital today in hopes of aggressively treating your COPD exacerbation and returning you to baseline.

## 2017-11-13 NOTE — Assessment & Plan Note (Signed)
Assessment  Her diabetic regimen has been modified recently and she is no longer on basal insulin, but on Victoza 1.8 mg daily instead.  Review of her glucose meter download reveals that most of her sugars are in the mid 100 range with an occasional 200.  Her hemoglobin A1c today was excellent at 6.7.  Thus, despite the recent modifications in her regimen, her diabetic control has remained good.  Her neuropathic pain remains well controlled on the gabapentin.  Plan  We will continue the Victoza at 1.8 mg daily as well as the prandial NovoLog.  We will reassess her diabetic control at the follow-up visit.  I will order a urine for microalbumin which I hope will be obtained while she is an inpatient as she is due for a screening urinalysis.  We will also continue the gabapentin at 600 mg by mouth 3 times daily which controls her neuropathic pain well.

## 2017-11-13 NOTE — Progress Notes (Signed)
Medicine attending admission note: I personally interviewed and examined this patient and reviewed all pertinent clinical, laboratory, and imaging data, and I attest to the accuracy of the evaluation and management plan as recorded by resident physician Dr Vilma Prader. We appreciate the detailed clinic note provided by her primary care physician Dr. Eppie Gibson.  Complicated 67 year old woman with oxygen dependent obstructive airway disease, obesity sleep apnea syndrome on CPAP, pulmonary hypertension and right heart failure, valvular heart disease status post porcine mitral valve replacement in August 2012, paroxysmal atrial fibrillation status post Maze procedure at time of the valve replacement, type 2 diabetes now on insulin and Victoza with associated neuropathy, status post non-ST elevation MI March 2018.  She continues to smoke 1/2 pack of cigarettes daily.  Heavier in the past.  She presents with increasing dyspnea and wheezing and a cough productive of white sputum following recent exposure to a grandchild who stayed with her for 2 weeks and had a upper respiratory tract infection.  Symptoms not relieved by her usual bronchodilator therapy.  She appeared to be in significant respiratory distress.  Diffuse wheezing heard throughout the lung fields.  She is admitted at this time for aggressive respiratory treatment. A chest radiograph shows no infiltrate or effusion.  Mild pulmonary vascular congestion.  Surgical changes from previous valve repair. Pertinent lab: Bicarbonate 30: Chronic finding White count 6900 with 74% neutrophils Hemoglobin 11  Current exam: Blood pressure 131/77, pulse 69, temperature 97.9 F (36.6 C), temperature source Oral, resp. rate 18, SpO2 91 %. Pleasant obese Caucasian woman in mild respiratory distress.  Audible wheezing.  Skin is pink.  No cyanosis.  Pharynx no erythema or exudate.  No cervical supraclavicular or axillary adenopathy. Lungs hyperresonant to percussion.   Bilateral scattered expiratory wheezing. Regular cardiac rhythm.  No appreciable murmur or gallop.  No JVD.  No peripheral edema.  No calf tenderness.  Impression: Acute bronchitis precipitating an exacerbation of chronic obstructive airway disease. Plan: Continue oxygen, intensified bronchodilators, antibiotics, and steroids. She is agreeable to wearing a nicotine patch.

## 2017-11-13 NOTE — H&P (Signed)
Date: 11/13/2017               Patient Name:  [name] MRN: [id number]  DOB: Apr 21, 1950 Age / Sex: 67 y.o., female   PCP: [name], MD         Medical Service: Internal Medicine Teaching Service         Attending Physician: Dr. Annia Belt, MD    First Contact: Dr. Annie Paras Pager: [phone]  Second Contact: Dr. Heber [place] Pager: [phone]       After Hours (After 5p/  First Contact Pager: [phone]  weekends / holidays): Second Contact Pager: [phone]   Chief Complaint: cough  History of Present Illness: [name] is a 67 year old woman with a history of COPD (wears 2L supplemental oxygen when sleeping and when leaving her house), pulmonary HTN, mitral valve stenosis s/p mitral valve replacement, paroxysmal afib on Warfarin, gastrointestinal AVMs, chronic iron deficiency anemia secondary to GI blood loss, chronic diastolic heart failure (EF 60% on 06/2017 ECHO), OSA on CPAP, obesity, tobacco abuse, diabetes, and history of renal cell carcinoma who presented for routine clinic evaluation and endorsed a two-week history of coughing. The cough is productive of a clear sputum without hemoptysis, with sputum volumes markedly increased from her baseline. Her shortness of breath is stable from her baseline and she has not needed to turn her oxygen up or wear it more frequently. She endorses mild runny nose and sinus congestion. She denies fevers, chills, sore throat, and chest pain. She has tried an antihistamine and Delsym for her congestion and cough without relief. She has used her albuterol inhaler more frequently in the past two weeks, up to 2 times per day, with some relief. She reports her granddaughter has been sick with similar symptoms. She has a history of diastolic heart failure for which she takes lasix BID (54m in the am and 419min the pm). She denies increased leg swelling and has not noticed any recent weight gain. She endorses urinary incontinence whenever she  coughs. She denies dysuria.   Upon arrival to the clinic, she was afebrile, hemodynamically stable, and satting well on her baseline oxygen requirement of 2L. Labs were significant for no leukocytosis, stable anemia with Hb at 11.3, TSH wnl, A1c 6.7, and INR 3.0 (goal = 2).  Meds:     Current Outpatient Medications on File Prior to Encounter  Medication Sig Dispense Refill  . albuterol (PROAIR HFA) 108 (90 Base) MCG/ACT inhaler Inhale 2 puffs into the lungs every 6 (six) hours as needed for shortness of breath. 3 Inhaler 3  . ALPRAZolam (XANAX) 1 MG tablet Take 0.5-1 tablets (0.5-1 mg total) by mouth 2 (two) times daily as needed for anxiety. 60 tablet 5  . Blood Glucose Monitoring Suppl (ONETOUCH VERIO) W/DEVICE KIT 1 each by Does not apply route 4 (four) times daily. 1 kit 0  . buPROPion (WELLBUTRIN) 75 MG tablet Take 1 tablet (75 mg total) by mouth 2 (two) times daily. 180 tablet 3  . clobetasol cream (TEMOVATE) 0.2.94 Apply 1 application topically 2 (two) times daily.  0  . DULoxetine (CYMBALTA) 60 MG capsule Take 1 capsule (60 mg total) by mouth 2 (two) times daily. 60 capsule 11  . ferrous gluconate (FERGON) 324 MG tablet Take 1 tablet (324 mg total) by mouth 2 (two) times daily with a meal. (Patient taking differently: Take 324 mg by mouth daily with breakfast. ) 180 tablet 3  . fluticasone (FLONASE) 50 MCG/ACT  nasal spray Place 2 sprays into both nostrils daily. 15.8 g 2  . Fluticasone-Salmeterol (ADVAIR DISKUS) 500-50 MCG/DOSE AEPB Inhale 1 puff into the lungs 2 (two) times daily. 180 each 3  . furosemide (LASIX) 80 MG tablet Take 1 tablet (80 mg total) by mouth every morning AND 0.5 tablets (40 mg total) every evening. 45 tablet 3  . gabapentin (NEURONTIN) 300 MG capsule Take 2 capsules (600 mg total) by mouth 3 (three) times daily. (Patient taking differently: Take 600 mg by mouth 2 (two) times daily. ) 540 capsule 3  . glucose blood (ONETOUCH VERIO) test strip 1 each by Other route  QID. Use as instructed 350 each 11  . [START ON 12/13/2017] HYDROcodone-acetaminophen (NORCO/VICODIN) 5-325 MG tablet Take 2 tablets by mouth every 6 (six) hours as needed for severe pain. 100 tablet 0  . insulin aspart (NOVOLOG FLEXPEN) 100 UNIT/ML FlexPen Inject 15 Units into the skin 4 (four) times daily -  before meals and at bedtime. 30 mL 5  . Insulin Pen Needle 32G X 4 MM MISC Use to inject insulin up to 6 times a day 500 each 3  . Insulin Syringe-Needle U-100 31G X 15/64" 0.5 ML MISC Use to inject insulin up to 4 times a day 400 each 3  . Lancets Misc. (ACCU-CHEK FASTCLIX LANCET) KIT Check your blood 4 times a day dx code 250.00 insulin requiring 1 kit 2  . liraglutide (VICTOZA) 18 MG/3ML SOPN Inject 0.3 mLs (1.8 mg total) into the skin every morning. 9 pen 3  . loratadine (CLARITIN) 10 MG tablet Take 10 mg by mouth daily as needed for allergies.    . metoprolol succinate (TOPROL-XL) 25 MG 24 hr tablet Take 1 tablet (25 mg total) by mouth daily. 30 tablet 3  . nystatin cream (MYCOSTATIN) Apply 1 application topically 2 (two) times daily.  3  . omeprazole (PRILOSEC) 40 MG capsule Take 1 capsule (40 mg total) by mouth 2 (two) times daily. 180 capsule 3  . OXYGEN Inhale 2 L into the lungs continuous.    . potassium chloride SA (K-DUR,KLOR-CON) 20 MEQ tablet Take 1 tablet (20 mEq total) by mouth 2 (two) times daily. 180 tablet 3  . rosuvastatin (CRESTOR) 20 MG tablet Take 1 tablet (20 mg total) by mouth at bedtime. 90 tablet 3  . tiotropium (SPIRIVA HANDIHALER) 18 MCG inhalation capsule Place 1 capsule (18 mcg total) into inhaler and inhale daily. 90 capsule 3  . triamcinolone cream (KENALOG) 0.1 % Apply 1 application topically 2 (two) times daily.  3  . warfarin (COUMADIN) 2.5 MG tablet TAKE 1 TABLET BY MOUTH DAILY AT 6 PM EXCEPT ON MONDAYS 30 tablet 1      Allergies: Allergies as of 11/13/2017 - Review Complete 11/05/2017  Allergen Reaction Noted  . Lorazepam Other (See Comments)  11/04/2010  . Morphine and related Other (See Comments) 06/27/2013  . Oxycontin [oxycodone] Other (See Comments) 05/03/2012  . Tramadol hcl Swelling    Past Medical History:  Diagnosis Date  . Adenomatous polyps 05/14/2011   Colonoscopy (05/2011): 4 mm adenomatous polyp excised endoscopically Colonoscopy (02/2002): Adenomatous polyp excised endoscopically   . Allergic rhinitis 06/01/2012  . Anemia of chronic disease 01/01/2013  . Anxiety 07/24/2010  . Aortic atherosclerosis ([place]) 10/19/2014   Seen on CT scan, currently asymptomatic  . 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 Arthritis    "lower back" (11/13/2017)  . Asymptomatic cholelithiasis  09/25/2015   Seen on CT scan 08/2015  . Carotid artery stenosis    s/p right endarterectomy (06/2010) Carotid US (07/2010):  Left: Moderate-to-severe (60-79%) calcific and non-calcific plaque origin and proximal ICA and ECA   . Chronic congestive heart failure with left ventricular diastolic dysfunction ([place]) 10/21/2010  . Chronic constipation 02/03/2011  . Chronic daily headache 01/16/2014  . Chronic headache    "weekly now" (11/13/2017)  . Chronic low back pain 10/06/2012  . Chronic venous insufficiency 08/04/2012  . Clear cell renal cell carcinoma ([place]) 07/21/2011   s/p cryoablation of left RCC in 09/2011 by [name]. Followed by [name]  [facility]) .    Marland Kitchen COPD (chronic obstructive pulmonary disease) with emphysema (HCC)    PFTs 02/2012: FEV1 0.92 (40%), ratio 69, 27% increase in FEV1 with BD, TLC 91%, severe airtrapping, DLCO49% On chronic home O2. Pulmonary rehab referral 05/2012   . Depression 11/19/2005  . Esophageal stenosis 08/08/2015   Mild, benign-appearing on EGD 06/29/2015  . Fibromyalgia 08/29/2010  . Gastroesophageal reflux disease   . History of blood transfusion    "several times"   . History of hiatal hernia   . History of kidney stones    . Hyperlipidemia LDL goal < 100 11/20/2005  . Internal hemorrhoids 08/04/2012  . Lichen sclerosus of female genitalia 01/12/2017  . Mitral stenosis    s/p Mitral valve replacement with a 27-mm pericardial porcine valve (Medtronic Mosaic valve, serial #[id number] on 09/20/10, Dr. Prescott Gum)   . Moderate to severe pulmonary hypertension ([place])    2014 TEE w PA peak pressure 46 mmHg, s/p MV replacement   . Moderately severe major depression ([place]) 11/19/2005  . Obesity (BMI 30.0-34.9) 10/23/2011  . Obstructive sleep apnea    Nocturnal polysomnography (06/2009): Moderate sleep apnea/ hypopnea syndrome , AHI 17.8 per hour with nonpositional hypopneas. CPAP titration to 12 CWP, AHI 2.4 per hour. On nocturnal CPAP via a small resMed Quattro full-face mask with heated humidifier.   . On home oxygen therapy    "2.5L; anytime I sleep; day or night" (11/13/2017)  . OSA on CPAP    "w/my oxygen" (11/13/2017)  . Osteoporosis    DEXA (12/09/2011): L-spine T -3.7, left hip T -1.4 DEXA (12/2004): L-spine T -2.6, left hip -0.1   . Paroxysmal atrial fibrillation ([place]) 10/22/2010   s/p Left atrial maze procedure for paroxysmal atrial fibrillation on 09/20/2010 by Dr Prescott Gum.  Subsequent splenic infarct, decision was made to re-anticoagulate with coumadin, likely life-long as this is the most likely cause of the splenic infarct.   . Pneumonia    "once" (11/13/2017)  . Pulmonary hypertension due to chronic obstructive pulmonary disease ([place]) 04/25/2016  . Right nephrolithiasis 09/06/2014   5 mm non-obstructing calculus seen on CT scan 09/05/2014   . Seborrheic keratosis 09/28/2015  . Severe obesity (BMI 35.0-39.9) with comorbidity ([place]) 10/23/2011  . Shortness of breath dyspnea   . Tobacco abuse 07/28/2012  . Type 2 diabetes mellitus with diabetic neuropathy [facility])    Past Surgical History:  Procedure Laterality Date  . CARDIAC CATHETERIZATION    . CARDIAC VALVE REPLACEMENT  Aug. 2012   "mitral valve"  . CAROTID  ENDARTERECTOMY Right 07/04/2010   by [name] for asymptomatic right carotid artery stenosis  . CATARACT EXTRACTION W/ INTRAOCULAR LENS  IMPLANT, BILATERAL Bilateral   . CHEST TUBE INSERTION  09/24/2010   Dr Prescott Gum  . COLONOSCOPY  05/12/2011   performed by Dr. Michail Sermon. Showing small  internal hemorrhoids, single tubular adenoma polyp  . CRYOABLATION Left 09/2011   by [name]. Followed by [name]  [facility]) .    Marland Kitchen DILATION AND CURETTAGE OF UTERUS    . ESOPHAGOGASTRODUODENOSCOPY  05/12/2011   performed by Dr. Michail Sermon. Negative for ulcerations, biopsy negative for evidence of celiac sprue  . ESOPHAGOGASTRODUODENOSCOPY N/A 06/29/2015   Procedure: ESOPHAGOGASTRODUODENOSCOPY (EGD);  Surgeon: Clarene Essex, MD;  Location: [facility] ENDOSCOPY;  Service: Endoscopy;  Laterality: N/A;  . ESOPHAGOGASTRODUODENOSCOPY N/A 03/29/2016   Procedure: ESOPHAGOGASTRODUODENOSCOPY (EGD);  Surgeon: Clarene Essex, MD;  Location[facility] ENDOSCOPY;  Service: Endoscopy;  Laterality: N/A;  . FRACTURE SURGERY    . GIVENS CAPSULE STUDY N/A 06/30/2015   Procedure: GIVENS CAPSULE STUDY;  Surgeon: Clarene Essex, MD;  Location: [facility] ENDOSCOPY;  Service: Endoscopy;  Laterality: N/A;  . GIVENS CAPSULE STUDY N/A 06/29/2015   Procedure: GIVENS CAPSULE STUDY;  Surgeon: Clarene Essex, MD;  Location: [place];  Service: Endoscopy;  Laterality: N/A;  . HEMORRHOID SURGERY  1970s?   "lanced"  . HYSTEROSCOPY W/ ENDOMETRIAL ABLATION  06/2001   for persistent post-menopausal bleeding // by S. [name], M.D.  . IR GENERIC HISTORICAL  08/23/2015   IR RADIOLOGIST EVAL & MGMT 08/23/2015 Aletta Edouard, MD [facility] INTERV RAD  . IR GENERIC HISTORICAL  04/09/2016   IR RADIOLOGIST EVAL & MGMT 04/09/2016 Aletta Edouard, MD [facility] INTERV RAD  . IR RADIOLOGIST EVAL & MGMT  10/07/2016  . IR RADIOLOGIST EVAL & MGMT  06/25/2017  . LEFT HEART CATH AND CORONARY ANGIOGRAPHY N/A 04/21/2016   Procedure: Left Heart Cath and Coronary Angiography;  Surgeon:  Lorretta Harp, MD;  Location: [place] CV LAB;  Service: Cardiovascular;  Laterality: N/A;  . LIPOMA EXCISION  08/2005   occipital lipoma 1.5cm - by Dr. [name]  . LITHOTRIPSY  ~ 2000  . MAZE Left 09/20/10   for paroxysmal atrial fibrillation (Dr. Prescott Gum)  . MITRAL VALVE REPLACEMENT  09/20/10    with a 27-mm pericardial porcine valve (Medtronic Mosaic valve, serial #[id number]). 09/20/10, Dr Prescott Gum  . ORIF CLAVICLE FRACTURE  01/2004   by Thana Farr. [name], M.D for Right clavicle nonunion.; "it's got a pin in it"  . REFRACTIVE SURGERY Bilateral   . RIGHT HEART CATH N/A 04/23/2016   Procedure: Right Heart Cath;  Surgeon: [name], MD;  Location: [place] CV LAB;  Service: Cardiovascular;  Laterality: N/A;  . TONSILLECTOMY    . TUBAL LIGATION      Family History: Father and brother died of MI in their 43s. Daughter with lupus  Social History: Lives by herself. Has family nearby, but her sister is currently sick with C. Diff and her granddaughter is pregnant. She smokes 1 pack of cigarettes per day with a 54 pack year history. She denies alcohol or illicit drug use.  Review of Systems: A complete ROS was negative except as per HPI.   Physical Exam: Blood pressure 131/77, pulse 69, temperature 97.9 F (36.6 C), temperature source Oral, resp. rate 18, SpO2 91 %.  Constitutional: Well-developed, well-nourished, and in no distress. Pauses during interview multiple times to cough. Wearing supplemental oxygen via her home tank at 2L. HEENT: Normocephalic, atraumatic. EOM are normal.  Cardiovascular: Normal rate and regular rhythm. No murmurs, rubs, or gallops. Pulmonary/Chest: Labored effort between coughing. Audible wheezing without stethoscope. Posterior basilar rhonchi. Diffuse expiratory wheezes throughout anterior and posterior lung fields.  Abdominal: Bowel sounds present. Soft, non-distended, non-tender. Ext: Trace pitting edema BLE.  Skin: Warm and dry. No rashes or  wounds.  CXR: personally reviewed my interpretation is hyperinflated lungs without any focal opacities.  Assessment & Plan by Problem: Active Problems:   COPD exacerbation ([place])  Ms. Mcgurn is a 67 year old woman with a history of COPD (wears 2L supplemental oxygen when sleeping and when leaving her house), pulmonary HTN, mitral valve stenosis s/p mitral valve replacement, paroxysmal afib on Warfarin, gastrointestinal AVMs, chronic iron deficiency anemia secondary to GI blood loss, chronic diastolic heart failure (EF 60% on 06/2017 ECHO), OSA on CPAP, obesity, tobacco abuse, diabetes, and history of renal cell carcinoma s/p left kidney cryoablation who presented to a routine clinic visit with a two-week history of increased cough and increased sputum production. Her clinical picture is consistent with COPD exacerbation likely in the setting of viral or bacterial respiratory illness.  COPD Exacerbation - Increased cough, increased clear sputum production, increased rescue inhaler requirement, rhinorrhea, and sinus congestion. Granddaughter with similar symptoms. Exam is significant for labored respiratory effort between coughing spells, audible wheezing without stethoscope, posterior basilar rhonchi, and diffuse expiratory wheezes throughout the anterior and posterior lung fields. She is currently satting well on her home oxygen requirement of 2L by [place]. Her home COPD regimen includes albuterol, fluticasone-salmeterol inhaler, and spiriva inhaler - Patient appears euvolemic with low concern for CHF exacerbation. Plan - IV solumedrol 125 mg once followed by IV solumedrol 30m BID - IV azithromycin [id number] - duonebs q6hrs scheduled - albuterol nebs q4hrs PRN  - continue home ICS-LABA (fluticasone-salmeterol) and spiriva - Tessalon perles 10065mID PRN - Robitussin DM q4hrs PRN  - supplemental oxygen as needed - respiratory panel - am CBC and BMP  Afib on Warfarin - INR goal of 2 due to  history of GI AVMs and GI bleeds with higher INR. INR today is 3. Will hold coumadin dose tonight and recheck INR tomorrow in the setting of antibiotic therapy. Plan - Hold Coumadin - am INR  Diastolic heart failure - Continue home lasix 10m81mm and 40mg70m  HTN - Continue home metoprolol 25 mg daily  HLD  - Continue home crestor 20mg 65my  DMII - A1c 6.7 on day of admission. Home regimen includes novolog 15u QID and Victoza. Will monitor closely given IV steroid administration. Plan - CBG QID - Novolog 6u TID with meals - SSI, resistant  Chronic back pain - Continue home Norco 2 tablets q6hrs PRN and gabapentin 600mg B76mOSA - CPAP at night  Tobacco use - Nicotine patch  Anxiety and depression - Continue home xanax 0.5mg BID7mN, buproprion 75mg BID42md duloxetine 60 mg BID  FEN: no IV fluids, regular diet, replace electrolytes as needed  DVT ppx: SCDs while holding Coumadin Code status: FULL code  Dispo: Admit patient to Inpatient with expected length of stay greater than 2 midnights.  Signed: Dorrell, Corinne Ports/2019, 6:41 PM  Pager: [phone]

## 2017-11-13 NOTE — Assessment & Plan Note (Signed)
Assessment  On examination today her rate was well controlled on the beta-blocker despite her COPD exacerbation.  She also appeared to be regular on auscultation suggesting she remains in normal sinus rhythm.  Her INR today was elevated at 3.0.  Of note, we have tried to keep her INR at approximately 2.0 because of her history of bowel AVMs and GI bleeds when her INR is higher.  Plan  We will continue the warfarin, but will hold the dose today as I suspect her acute illness has resulted in an increase in INR.  Our target remains approximately 2.0.  We will also continue with her beta-blocker for rate control in case she were to flip back into paroxysmal atrial fibrillation.

## 2017-11-13 NOTE — Progress Notes (Signed)
   Subjective:    Patient ID: [name], female    DOB: June 03, 1950, 67 y.o.   MRN: [id number]  HPI  [name] is here for follow-up of many of her chronic medical problems, but had an acute exacerbation of her COPD and therefore this was what was primarily addressed during this visit.Marland Kitchen Please see the A&P for the status of the pt's chronic medical problems.  She notes a several day history of increased cough with increased sputum production.  The sputum is characterized as a white phlegm without blood.  This has been accompanied by increasing dyspnea above baseline.  She rarely requires her rescue albuterol at baseline, but has had to use it daily since this exacerbation begun.  She denies feeling feverish or having shaking chills.  She has had audible wheezing.  Her niece has been ill recently with a respiratory illness and been at [name] house.  Her sister has also been ill but with a GI illness.  She has no other known sick contacts.  She has been compliant with her bronchodilators.  With the significance of her dyspnea, and the fact that she lives alone, she is open to the idea of an admission for aggressive bronchodilators, IV steroids, and IV antibiotics in hopes of turning the tide on her acute chronic obstructive pulmonary disease exacerbation.  Review of Systems  Constitutional: Positive for activity change and fatigue. Negative for chills, diaphoresis and fever.  HENT: Positive for congestion, rhinorrhea, sinus pressure and sinus pain.   Respiratory: Positive for cough, chest tightness, shortness of breath and wheezing.   Cardiovascular: Negative for chest pain, palpitations and leg swelling.  Musculoskeletal: Positive for arthralgias and back pain. Negative for joint swelling and myalgias.  Skin: Positive for color change.  Psychiatric/Behavioral: Positive for dysphoric mood and sleep disturbance. The patient is nervous/anxious.       Objective:   Physical Exam    Constitutional: She is oriented to person, place, and time. She appears well-developed and well-nourished. She appears ill. She appears distressed.  HENT:  Head: Normocephalic and atraumatic.  Cardiovascular: Normal rate, regular rhythm and normal heart sounds.  No extrasystoles are present. Exam reveals no gallop and no friction rub.  No murmur heard. Pulmonary/Chest: She has wheezes in the right upper field, the right middle field, the right lower field, the left upper field, the left middle field and the left lower field. She has rhonchi in the right middle field, the right lower field, the left middle field and the left lower field. She exhibits no tenderness, no bony tenderness, no crepitus, no edema and no swelling.  Musculoskeletal:       Right lower leg: Normal.       Left lower leg: Normal.  Neurological: She is alert and oriented to person, place, and time.  Skin: Skin is warm and dry. Ecchymosis noted. She is not diaphoretic. There is erythema. Nails show no clubbing.  Psychiatric: She has a normal mood and affect. Her behavior is normal. Her mood appears not anxious. She is not agitated.  Nursing note and vitals reviewed.     Assessment & Plan:   Please see problem based charting.

## 2017-11-13 NOTE — Assessment & Plan Note (Signed)
Assessment  She presents today with worsening dyspnea, increased cough, and increased sputum production.  Her examination is notable for marked prolongation in her expiratory phase with audible wheezes without a stethoscope.  She has bibasilar rales and during examination had coughing fits that left her red-faced and even more dyspneic.  A chest x-ray shows hyperinflation within a barrel chest.  There is notable bronchial thickening without definitive infiltrate.  She has mild left lateral pleural thickening.  Based on her history, physical, and x-ray she currently has a moderately severe COPD exacerbation.  I suspect the etiology of the exacerbation is an acute bronchitis.  Given the sick contact of her niece I am leaning towards a viral bronchitis, but given her severe chronic obstructive pulmonary disease it is not unreasonable to believe she may have a predominant bacterial cause of her bronchitis leading to increased inflammation.  She is sufficiently disabled from this exacerbation that I am concerned she will not be able to care for herself alone, which is her current living situation.  I therefore believe she needs admitted to observation for aggressive bronchodilators, IV steroids, and IV antibiotics to quickly improve her COPD exacerbation and allow for a return home to complete the course of therapy.  Plan  She will be admitted to an observation bed without telemetry.  I appreciate the internal medicine teaching service's help in caring for her.  I will defer the specifics for the management of her COPD to them although I anticipate she will be started on an IV antibiotic, IV Solu-Medrol, and have aggressive bronchodilators provided.  It is hoped, that this will turn her symptoms around rapidly and allow her to return home in the next 48 hours or so.

## 2017-11-14 DIAGNOSIS — G4733 Obstructive sleep apnea (adult) (pediatric): Secondary | ICD-10-CM

## 2017-11-14 DIAGNOSIS — E119 Type 2 diabetes mellitus without complications: Secondary | ICD-10-CM

## 2017-11-14 DIAGNOSIS — F17200 Nicotine dependence, unspecified, uncomplicated: Secondary | ICD-10-CM

## 2017-11-14 DIAGNOSIS — R0689 Other abnormalities of breathing: Secondary | ICD-10-CM

## 2017-11-14 DIAGNOSIS — E669 Obesity, unspecified: Secondary | ICD-10-CM

## 2017-11-14 LAB — RESPIRATORY PANEL BY PCR

## 2017-11-14 LAB — CBC
HCT: 38.3 % (ref 36.0–46.0)
Hemoglobin: 11.7 g/dL — ABNORMAL LOW (ref 12.0–15.0)
MCH: 29.3 pg (ref 26.0–34.0)
MCHC: 30.5 g/dL (ref 30.0–36.0)
MCV: 96 fL (ref 78.0–100.0)
Platelets: 223 10*3/uL (ref 150–400)
RBC: 3.99 MIL/uL (ref 3.87–5.11)
RDW: 15.5 % (ref 11.5–15.5)
WBC: 6.9 10*3/uL (ref 4.0–10.5)

## 2017-11-14 LAB — PROTIME-INR
INR: 2.05
Prothrombin Time: 22.9 seconds — ABNORMAL HIGH (ref 11.4–15.2)

## 2017-11-14 LAB — BASIC METABOLIC PANEL
Anion gap: 11 (ref 5–15)
BUN: 13 mg/dL (ref 8–23)
CO2: 28 mmol/L (ref 22–32)
Calcium: 8.9 mg/dL (ref 8.9–10.3)
Chloride: 98 mmol/L (ref 98–111)
Creatinine, Ser: 1.04 mg/dL — ABNORMAL HIGH (ref 0.44–1.00)
GFR calc Af Amer: >60 mL/min (ref >60)
GFR calc non Af Amer: 54 mL/min — ABNORMAL LOW (ref >60)
Glucose, Bld: 185 mg/dL — ABNORMAL HIGH (ref 70–99)
Potassium: 3.6 mmol/L (ref 3.5–5.1)
Sodium: 137 mmol/L (ref 135–145)

## 2017-11-14 MED ORDER — WARFARIN SODIUM 2 MG PO TABS
2.0000 mg | ORAL_TABLET | Freq: Once | ORAL | Status: AC
  Administered 2017-11-14: 2 mg via ORAL
  Filled 2017-11-14: qty 1

## 2017-11-14 MED ORDER — ORAL CARE MOUTH RINSE
15.0000 mL | Freq: Two times a day (BID) | OROMUCOSAL | Status: DC
  Administered 2017-11-14 – 2017-11-17 (×5): 15 mL via OROMUCOSAL

## 2017-11-14 MED ORDER — ONDANSETRON HCL 4 MG/2ML IJ SOLN: 4.0000 mg | Freq: Four times a day (QID) | INTRAMUSCULAR | Status: DC | PRN

## 2017-11-14 MED ORDER — IPRATROPIUM-ALBUTEROL 0.5-2.5 (3) MG/3ML IN SOLN
3.0000 mL | Freq: Three times a day (TID) | RESPIRATORY_TRACT | Status: DC
  Administered 2017-11-14 – 2017-11-15 (×3): 3 mL via RESPIRATORY_TRACT
  Filled 2017-11-14 (×3): qty 3

## 2017-11-14 MED ORDER — WARFARIN - PHARMACIST DOSING INPATIENT
Freq: Every day | Status: DC
  Administered 2017-11-16: 18:00:00

## 2017-11-14 MED ORDER — DEXTROSE 5 % IV SOLN
250.0000 mg | INTRAVENOUS | Status: DC
  Administered 2017-11-14: 250 mg via INTRAVENOUS
  Filled 2017-11-14 (×2): qty 250

## 2017-11-14 MED ORDER — ALPRAZOLAM 0.5 MG PO TABS
1.0000 mg | ORAL_TABLET | Freq: Two times a day (BID) | ORAL | Status: DC | PRN
  Administered 2017-11-14 – 2017-11-18 (×8): 1 mg via ORAL
  Filled 2017-11-14 (×8): qty 2

## 2017-11-14 NOTE — Progress Notes (Signed)
   Subjective: No overnight events. Ms. Rolston reports that her breathing feels better than yesterday, but she continues to have cough and sputum production. She reports nausea during her dose of azithromycin last night, which has now resolved. She has no new symptoms or other concerns at this time.  Objective:  Vital signs in last 24 hours: Vitals:   11/13/17 2045 11/13/17 2246 11/14/17 0156 11/14/17 0545  BP: (!) 164/66   (!) 152/61  Pulse: 67 80  79  Resp: _0 Temp: 98.2 F (36.8 C)   97.6 F (36.4 C)  TempSrc: Oral   Oral  SpO2: 95% 90% 93% 93%   Constitutional: Sitting comfortably on the edge of her bed. No distress Cardiovascular: Normal rate and regular rhythm. No murmurs, rubs, or gallops. Pulmonary/Chest: Effort normal today on room air. No audible wheezes. Poor air movement in the posterior lung fields, but no wheezing heard. Expiratory wheezing present in the anterior lung fields. Abdominal: Soft, non-distended, non-tender. Ext: No lower extremity edema. Skin: Warm and dry. No rashes or wounds.  Assessment/Plan:  Active Problems:   OSA (obstructive sleep apnea)   PAF (paroxysmal atrial fibrillation) (HCC)   Diabetes mellitus type 2, insulin dependent (HCC)   COPD exacerbation (HCC)   Acute bronchitis   History of mitral valve replacement with bioprosthetic valve   Oxygen dependent   Pulmonary hypertension due to alveolar hypoventilation disorder ([place])   History of maze procedure  KathrynElkholyis a 67 year old woman with a history of COPD (wears 2L supplemental oxygen when sleeping and when leaving her house), pulmonary HTN, mitral valve stenosis s/p mitral valvereplacement, paroxysmal afib on Warfarin, gastrointestinal AVMs, chronic iron deficiency anemia secondary to GI blood loss, chronic diastolic heart failure (EF 60% on 06/2017 ECHO), OSA on CPAP, obesity, tobacco abuse, diabetes, and history of renal cell carcinoma s/p left kidney cryoablation who  presented to a routine clinic visit with a two-week history of increased cough and increased sputum production. Her clinical picture is consistent with COPD exacerbation likely in the setting of viral or bacterial respiratory illness.  COPD Exacerbation  - Patient appears much improved today with less labored breathing and good oxygen saturations on room air. She no longer has audible wheezes, but wheezing can be heard in the anterior lung fields on auscultation. RVP positive for rhinovirus, which most likely caused this exacerbation. However, will continue with antibiotics for their anti-inflammatory effect.  Plan - IV solumedrol 75m BID - IV azithromycin 250 mg q24hrs (day 2/5) - Zofran 443mIV q6hrs for nausea - duonebs q6hrs scheduled - albuterol nebs q4hrs PRN  - continue home ICS-LABA (fluticasone-salmeterol) and spiriva - Tessalon perles 10081mID PRN - Robitussin DM q4hrs PRN  - supplemental oxygen as needed (wears 2L at night at baseline) - am BMP  Afib on Warfarin - INR goal of 1.5-2 due to history of GI AVMs and GI bleeds with higher INR. Held warfarin yesterday for INR of 3. Repeat INR today is 2.05. Plan - Pharmacy consult for coumadin management. Resume based on their recs.  DMII - A1c 6.7 on day of admission. Home regimen includes novolog 15u QID and Victoza. Will monitor closely given IV steroid administration. Plan - CBG QID - Novolog 10u TID with meals - SSI, resistant  Dispo: Anticipated discharge in approximately 1-2 day(s).   Dorrell, DebAndree ElkD 11/14/2017, 6:46 AM Pager: [phone]

## 2017-11-14 NOTE — Progress Notes (Signed)
ANTICOAGULATION CONSULT NOTE - Follow Up Consult  Pharmacy Consult for Warfarin Indication: atrial fibrillation  Allergies  Allergen Reactions  . Lorazepam Other (See Comments)    Patient's sister noted that ativan caused the patient to become extremely confused during hospitalization 09/2010; tolerates Xanax  . Morphine And Related Other (See Comments)    Injection site reaction  . Oxycontin [Oxycodone] Other (See Comments)    headache  . Tramadol Hcl Swelling    Ankle swelling    Patient Measurements: Height: 5' 2" (157.5 cm) Weight: 193 lb 5.5 oz (87.7 kg) IBW/kg (Calculated) : 50.1  Vital Signs: Temp: 98.5 F (36.9 C) (10/05 1108) Temp Source: Oral (10/05 1108) BP: 142/44 (10/05 1108) Pulse Rate: 80 (10/05 1108)  Labs: Recent Labs    11/13/17 1120 11/13/17 1220 11/14/17 0655  HGB  --  11.3* 11.7*  HCT  --  37.4 38.3  PLT  --  213 223  LABPROT  --   --  22.9*  INR 3.0  --  2.05  CREATININE  --  0.89 1.04*    Estimated Creatinine Clearance: 53.2 mL/min (A) (by C-G formula based on SCr of 1.04 mg/dL (H)).  Assessment: 67 year old female to resume warfarin for Afib,  INR goal of 1.5 to 2 due to history of GI bleed with higher INRs  INR on admission = 3, INR today = 2.05  Dose prior to admission = 2.5 mg daily except none on Mondays  Goal of Therapy:  INR = 1.5-2 Monitor platelets by anticoagulation protocol: Yes   Plan:  Warfarin 2 mg po x 1 tonight Daily INR  Thank you Anette Guarneri, PharmD [phone]  11/14/2017,11:23 AM

## 2017-11-14 NOTE — Progress Notes (Signed)
Respiratory panel came back positive for rhinovirus. MD notified. Dorrell, MD made aware and stated to resume droplet precautions. Will continue to monitor.

## 2017-11-14 NOTE — Progress Notes (Signed)
Pt has upper expiratory wheeze

## 2017-11-14 NOTE — Progress Notes (Signed)
Medicine attending: I examined this patient today and I concur with the evaluation and management plan as recorded by resident physician Dr Vilma Prader. Symptomatically improved. She reports GI intolerance to azithromycin. Wheezing resolved on exam today. We will continue short course of azithromycin but decrease the total dose. Taper steroids. Resume warfarin with target INR 1.5-2 in view of history of GI bleeding from AVMs.

## 2017-11-15 LAB — PROTIME-INR
INR: 1.78
Prothrombin Time: 20.6 seconds — ABNORMAL HIGH (ref 11.4–15.2)

## 2017-11-15 LAB — BASIC METABOLIC PANEL
Anion gap: 12 (ref 5–15)
BUN: 19 mg/dL (ref 8–23)
CO2: 28 mmol/L (ref 22–32)
Calcium: 9 mg/dL (ref 8.9–10.3)
Chloride: 96 mmol/L — ABNORMAL LOW (ref 98–111)
Creatinine, Ser: 1.1 mg/dL — ABNORMAL HIGH (ref 0.44–1.00)
GFR calc Af Amer: 58 mL/min — ABNORMAL LOW (ref >60)
GFR calc non Af Amer: 50 mL/min — ABNORMAL LOW (ref >60)
Glucose, Bld: 228 mg/dL — ABNORMAL HIGH (ref 70–99)
Potassium: 3.6 mmol/L (ref 3.5–5.1)
Sodium: 136 mmol/L (ref 135–145)

## 2017-11-15 MED ORDER — METHYLPREDNISOLONE SODIUM SUCC 125 MG IJ SOLR
80.0000 mg | Freq: Two times a day (BID) | INTRAMUSCULAR | Status: DC
  Administered 2017-11-15: 80 mg via INTRAVENOUS
  Filled 2017-11-15: qty 2

## 2017-11-15 MED ORDER — METHYLPREDNISOLONE SODIUM SUCC 40 MG IJ SOLR
40.0000 mg | Freq: Every day | INTRAMUSCULAR | Status: DC
  Administered 2017-11-16: 40 mg via INTRAVENOUS
  Filled 2017-11-15: qty 1

## 2017-11-15 MED ORDER — AZITHROMYCIN 500 MG PO TABS
500.0000 mg | ORAL_TABLET | Freq: Every day | ORAL | Status: DC
  Administered 2017-11-15: 500 mg via ORAL
  Filled 2017-11-15: qty 1

## 2017-11-15 MED ORDER — IPRATROPIUM-ALBUTEROL 0.5-2.5 (3) MG/3ML IN SOLN
3.0000 mL | Freq: Four times a day (QID) | RESPIRATORY_TRACT | Status: DC
  Administered 2017-11-15 – 2017-11-16 (×5): 3 mL via RESPIRATORY_TRACT
  Filled 2017-11-15 (×6): qty 3

## 2017-11-15 MED ORDER — INSULIN GLARGINE 100 UNIT/ML ~~LOC~~ SOLN
5.0000 [IU] | Freq: Every day | SUBCUTANEOUS | Status: DC
  Administered 2017-11-15: 5 [IU] via SUBCUTANEOUS
  Filled 2017-11-15: qty 0.05

## 2017-11-15 MED ORDER — WARFARIN SODIUM 2.5 MG PO TABS
2.5000 mg | ORAL_TABLET | Freq: Once | ORAL | Status: AC
  Administered 2017-11-15: 2.5 mg via ORAL
  Filled 2017-11-15: qty 1

## 2017-11-15 NOTE — Progress Notes (Signed)
Medicine attending:  Clinical status and database reviewed with resident physician Dr. Kalman Shan and I concur with her evaluation and management plan which we discussed together. We will slowly taper her off parenteral steroids. Continue to monitor blood sugars. Appreciate pharmacy assistance.  INR now in her target range.

## 2017-11-15 NOTE — Progress Notes (Signed)
ANTICOAGULATION CONSULT NOTE - Follow Up Consult  Pharmacy Consult for Warfarin Indication: atrial fibrillation  Allergies  Allergen Reactions  . Lorazepam Other (See Comments)    Patient's sister noted that ativan caused the patient to become extremely confused during hospitalization 09/2010; tolerates Xanax  . Morphine And Related Other (See Comments)    Injection site reaction  . Oxycontin [Oxycodone] Other (See Comments)    headache  . Tramadol Hcl Swelling    Ankle swelling    Patient Measurements: Height: 5' 2" (157.5 cm) Weight: 193 lb 5.5 oz (87.7 kg) IBW/kg (Calculated) : 50.1  Vital Signs: Temp: 98.4 F (36.9 C) (10/06 0809) Temp Source: Oral (10/06 0809) BP: 159/65 (10/06 0809) Pulse Rate: 90 (10/06 0809)  Labs: Recent Labs    11/13/17 1120 11/13/17 1220 11/14/17 0655 11/15/17 0449  HGB  --  11.3* 11.7*  --   HCT  --  37.4 38.3  --   PLT  --  213 223  --   LABPROT  --   --  22.9* 20.6*  INR 3.0  --  2.05 1.78  CREATININE  --  0.89 1.04* 1.10*    Estimated Creatinine Clearance: 50.3 mL/min (A) (by C-G formula based on SCr of 1.1 mg/dL (H)).  Assessment: 67 year old female to resume warfarin for Afib,  INR goal of 1.5 to 2 due to history of GI bleed with higher INRs  INR on admission = 3, INR today = 1.78  Dose prior to admission = 2.5 mg daily except none on Mondays  Goal of Therapy:  INR = 1.5-2 Monitor platelets by anticoagulation protocol: Yes   Plan:  Warfarin 2.5 mg po x 1 tonight Daily INR  Thank you Anette Guarneri, PharmD [phone]  11/15/2017,11:22 AM

## 2017-11-15 NOTE — Progress Notes (Signed)
Patient educated on importance of wearing oxygen due to her low oxygen saturation. Patient stated, "I know, I'll put it on after I eat." Patient re-educated. Will continue to encourage.

## 2017-11-15 NOTE — Progress Notes (Signed)
Subjective: patient was eating up in bed. She reports improvement during hospitalization. She states she is still wheezing but denies worsening of her shortness of breath.  Objective:  Vital signs in last 24 hours: Vitals:   11/15/17 0715 11/15/17 0809 11/15/17 1331 11/15/17 1745  BP:  (!) 159/65  (!) 141/43  Pulse:  90  75  Resp:  (!) 25  20  Temp:  98.4 F (36.9 C)  98.5 F (36.9 C)  TempSrc:  Oral  Oral  SpO2: 94% (!) 85% 95% 100%  Weight:      Height:       Physical Exam  Constitutional: She is well-developed, well-nourished, and in no distress.  Cardiovascular: Normal rate, regular rhythm and normal heart sounds. Exam reveals no gallop and no friction rub.  No murmur heard. Pulmonary/Chest: Effort normal. No respiratory distress. She has wheezes.  Diffuse wheezing throughout on the anterior and posterior chest  Neurological: She is alert.  Skin: Skin is warm and dry.     Assessment/Plan:  Active Problems:   OSA (obstructive sleep apnea)   PAF (paroxysmal atrial fibrillation) (HCC)   Diabetes mellitus type 2, insulin dependent (HCC)   COPD exacerbation (HCC)   Acute bronchitis   History of mitral valve replacement with bioprosthetic valve   Oxygen dependent   Pulmonary hypertension due to alveolar hypoventilation disorder (HCC)   History of maze procedure  COPD exacerbation Patient had wheezing on exam today. Will continue with IV steroids and scheduled DuoNeb's.  She is not requiring additional supplemental oxygen above her home 2 L SPO2 via nasal cannula.   - IV Solu-Medrol 80 mg - DuoNeb's every 6 hours - Azithromycin - continue home ICS-LABA (fluticasone-salmeterol) and spiriva - Tessalon perles 129m TID PRN - Robitussin DM q4hrs PRN - supplemental oxygen 2L  Afib on Warfarin INR today is 1.78. INR goal of 1.5-2 due to history of GI AVMs and GI bleeds with higher INR. appreciate pharmacy help with management of Coumadin. - Pharmacy consult for  coumadin management.   DMII - A1c 6.7 on day of admission. Home regimen includesnovolog 15u QID and Victoza. Will monitor closely given IV steroid administration.  Plan - CBG QID -Novolog 10u TID with meals - SSI, resistant -adding Lantus 5 units daily at bedtime   Dispo: Anticipated discharge in approximately 3 day(s).   HKalman ShanRHappy [place] DO 11/15/2017, 7:37 PM Pager: [phone]

## 2017-11-16 DIAGNOSIS — E1165 Type 2 diabetes mellitus with hyperglycemia: Secondary | ICD-10-CM

## 2017-11-16 DIAGNOSIS — J206 Acute bronchitis due to rhinovirus: Secondary | ICD-10-CM

## 2017-11-16 LAB — BASIC METABOLIC PANEL
Anion gap: 11 (ref 5–15)
BUN: 20 mg/dL (ref 8–23)
CO2: 24 mmol/L (ref 22–32)
Calcium: 8.6 mg/dL — ABNORMAL LOW (ref 8.9–10.3)
Chloride: 97 mmol/L — ABNORMAL LOW (ref 98–111)
Creatinine, Ser: 1.13 mg/dL — ABNORMAL HIGH (ref 0.44–1.00)
GFR calc Af Amer: 57 mL/min — ABNORMAL LOW (ref >60)
GFR calc non Af Amer: 49 mL/min — ABNORMAL LOW (ref >60)
Glucose, Bld: 446 mg/dL — ABNORMAL HIGH (ref 70–99)
Potassium: 4.1 mmol/L (ref 3.5–5.1)
Sodium: 132 mmol/L — ABNORMAL LOW (ref 135–145)

## 2017-11-16 LAB — GLUCOSE, CAPILLARY
Glucose-Capillary: 134 mg/dL — ABNORMAL HIGH (ref 70–99)
Glucose-Capillary: 136 mg/dL — ABNORMAL HIGH (ref 70–99)
Glucose-Capillary: 155 mg/dL — ABNORMAL HIGH (ref 70–99)
Glucose-Capillary: 177 mg/dL — ABNORMAL HIGH (ref 70–99)
Glucose-Capillary: 195 mg/dL — ABNORMAL HIGH (ref 70–99)
Glucose-Capillary: 203 mg/dL — ABNORMAL HIGH (ref 70–99)
Glucose-Capillary: 246 mg/dL — ABNORMAL HIGH (ref 70–99)
Glucose-Capillary: 286 mg/dL — ABNORMAL HIGH (ref 70–99)
Glucose-Capillary: 290 mg/dL — ABNORMAL HIGH (ref 70–99)
Glucose-Capillary: 329 mg/dL — ABNORMAL HIGH (ref 70–99)
Glucose-Capillary: 330 mg/dL — ABNORMAL HIGH (ref 70–99)
Glucose-Capillary: 375 mg/dL — ABNORMAL HIGH (ref 70–99)
Glucose-Capillary: 465 mg/dL — ABNORMAL HIGH (ref 70–99)

## 2017-11-16 LAB — PROTIME-INR
INR: >10
INR: 1.4
Prothrombin Time: >90 seconds — ABNORMAL HIGH (ref 11.4–15.2)
Prothrombin Time: 17.1 seconds — ABNORMAL HIGH (ref 11.4–15.2)

## 2017-11-16 MED ORDER — INSULIN GLARGINE 100 UNIT/ML ~~LOC~~ SOLN
10.0000 [IU] | Freq: Every day | SUBCUTANEOUS | Status: DC
  Administered 2017-11-16: 10 [IU] via SUBCUTANEOUS
  Filled 2017-11-16 (×2): qty 0.1

## 2017-11-16 MED ORDER — INSULIN ASPART 100 UNIT/ML ~~LOC~~ SOLN: 10.0000 [IU] | Freq: Once | SUBCUTANEOUS | Status: DC

## 2017-11-16 MED ORDER — IPRATROPIUM-ALBUTEROL 0.5-2.5 (3) MG/3ML IN SOLN
3.0000 mL | Freq: Three times a day (TID) | RESPIRATORY_TRACT | Status: DC
  Administered 2017-11-17: 3 mL via RESPIRATORY_TRACT
  Filled 2017-11-16: qty 3

## 2017-11-16 MED ORDER — INSULIN ASPART 100 UNIT/ML ~~LOC~~ SOLN
6.0000 [IU] | Freq: Three times a day (TID) | SUBCUTANEOUS | Status: DC
  Administered 2017-11-16 – 2017-11-18 (×5): 6 [IU] via SUBCUTANEOUS

## 2017-11-16 MED ORDER — AZITHROMYCIN 500 MG PO TABS
250.0000 mg | ORAL_TABLET | Freq: Every day | ORAL | Status: AC
  Administered 2017-11-16 – 2017-11-17 (×2): 250 mg via ORAL
  Filled 2017-11-16 (×2): qty 1

## 2017-11-16 MED ORDER — INSULIN ASPART 100 UNIT/ML ~~LOC~~ SOLN: 15.0000 [IU] | Freq: Three times a day (TID) | SUBCUTANEOUS | Status: DC

## 2017-11-16 MED ORDER — INSULIN ASPART 100 UNIT/ML ~~LOC~~ SOLN
6.0000 [IU] | Freq: Once | SUBCUTANEOUS | Status: AC
  Administered 2017-11-16: 6 [IU] via SUBCUTANEOUS

## 2017-11-16 MED ORDER — WARFARIN SODIUM 2.5 MG PO TABS
2.5000 mg | ORAL_TABLET | Freq: Once | ORAL | Status: AC
  Administered 2017-11-16: 2.5 mg via ORAL
  Filled 2017-11-16: qty 1

## 2017-11-16 NOTE — Progress Notes (Signed)
   Subjective: No overnight events. Ms. Bohannon reports that she feels improved today and has not required any extra supplemental oxygen. She is concerned about getting diarrhea with the azithromycin, but she hasn't experienced any yet. She has no new symptoms or other concerns today.   Objective:  Vital signs in last 24 hours: Vitals:   11/15/17 2038 11/16/17 0259 11/16/17 0300 11/16/17 0427  BP: 121/86   (!) 132/50  Pulse: 81  75 89  Resp: (!) _0 Temp: 98 F (36.7 C)   98.6 F (37 C)  TempSrc: Oral     SpO2: 93% 95% 95% 99%  Weight:      Height:       Constitutional: Sitting comfortably on the edge of her bed. She has been coughing on a piece of bacon and her face appears flushed.  Cardiovascular:Normal rateand regular rhythm. No murmurs, rubs, or gallops. Pulmonary/Chest:Effort normal today on room air. No audible wheezes. Poor air movement in the posterior lung fields, but no wheezing heard. Expiratory wheezing present in the anterior lung fields. Ext: No lower extremity edema. Skin: Warm and dry. No rashes or wounds.  Assessment/Plan:  Active Problems:   OSA (obstructive sleep apnea)   PAF (paroxysmal atrial fibrillation) (HCC)   Diabetes mellitus type 2, insulin dependent (HCC)   COPD exacerbation (HCC)   Acute bronchitis   History of mitral valve replacement with bioprosthetic valve   Oxygen dependent   Pulmonary hypertension due to alveolar hypoventilation disorder ([place])   History of maze procedure  Ms.Elkholyis a 67 year old woman with a history ofCOPD (wears 2L supplemental oxygen when sleeping and when leaving her house), pulmonary HTN,mitral valve stenosiss/pmitral valvereplacement, paroxysmal afibon Warfarin, gastrointestinal AVMs, chronic iron deficiency anemia secondary to GI blood loss, chronic diastolic heart failure(EF 60% on 06/2017 ECHO),OSA on CPAP, obesity, tobacco abuse, diabetes, and history of renal cell carcinomas/p left kidney  cryoablation who presented to a routine clinic visit with a two-week history of increased cough and increased sputum production. Her clinical picture is consistent with COPD exacerbation in the setting of rhinovirus infection.  COPD Exacerbation  - Patient continues to improve, however she still has wheezing in the anterior lung fields. Will transition to PO steroids tomorrow, as the patient already had a dose of solumedrol 40 IV today. Will continue with azithromycin for its anti-inflammatory effects and breathing treatments.  Plan - Start prednisone 58m tomorrow - IVazithromycin250 mg daily (day 4/5) - Zofran 429mIV q6hrs for nausea - duonebsq6hrs scheduled - albuterol nebs q4hrs PRN  - continue home ICS-LABA (fluticasone-salmeterol) and spiriva - Tessalon perles 1003mID PRN - Robitussin DM q4hrs PRN - supplemental oxygen as needed (wears 2L at night at baseline) - am BMP  Afib on Warfarin - INR goal of 1.5-2 due to history of GI AVMs and GI bleeds with higher INR. INR today is 1.4. Pharmacy has been consulted for coumadin management. Plan - Daily INR - Warfarin 2.5mg70mday  DMII - A1c 6.7 on day of admission. Home regimen includesnovolog 15u QID and Victoza. She has been hyperglycemic during this admission due to the steroids. Blood sugar this morning was 446. Plan - CBG QID  - Increase evening lantus from 5 to 10u -Increase mealtime Novolog from 10 to 15u - SSI, resistant  Dispo: Anticipated discharge in approximately 1-2 day(s).   Dorrell, DeboAndree Elk 11/16/2017, 6:24 AM Pager: [phone]

## 2017-11-16 NOTE — Progress Notes (Signed)
Medicine attending: I examined this patient today and reviewed all pertinent clinical and laboratory data and I attest to the accuracy of the evaluation and management plan as recorded by resident physician Dr. Vilma Prader.  She continues to slowly improve.  Minimal wheezing.  Respiratory virus panel positive for rhinovirus.  We will complete a 5-day course of azithromycin.  Transition to oral prednisone tomorrow.  Additional short and long acting insulin as needed to cover steroid-induced hyperglycemia.  Anticipate discharge tomorrow if otherwise stable.

## 2017-11-16 NOTE — Progress Notes (Signed)
RT Note: Follow up visit to place patient on CPAP. Patient not ready at this time. Will call when ready.

## 2017-11-16 NOTE — Progress Notes (Signed)
Lab reports critical PT >90 and INR >10. Yesterday PT was 20.6 and INR was 1.78. Spoke with pharmacist and we will recollect.

## 2017-11-16 NOTE — Progress Notes (Signed)
Placed patient on CPAP via FFM, previous settings with 2 lpm O2 bleed in.

## 2017-11-16 NOTE — Progress Notes (Signed)
ANTICOAGULATION CONSULT NOTE - Follow Up Consult  Pharmacy Consult for Warfarin Indication: atrial fibrillation  Allergies  Allergen Reactions  . Lorazepam Other (See Comments)    Patient's sister noted that ativan caused the patient to become extremely confused during hospitalization 09/2010; tolerates Xanax  . Morphine And Related Other (See Comments)    Injection site reaction  . Oxycontin [Oxycodone] Other (See Comments)    headache  . Tramadol Hcl Swelling    Ankle swelling    Patient Measurements: Height: 5' 2" (157.5 cm) Weight: 193 lb 5.5 oz (87.7 kg) IBW/kg (Calculated) : 50.1  Vital Signs: Temp: 98.6 F (37 C) (10/07 0427) Temp Source: Oral (10/06 2038) BP: 143/52 (10/07 0738) Pulse Rate: 92 (10/07 0738)  Labs: Recent Labs    11/13/17 1220  11/14/17 0655 11/15/17 0449 11/16/17 0412 11/16/17 0616  HGB 11.3*  --  11.7*  --   --   --   HCT 37.4  --  38.3  --   --   --   PLT 213  --  223  --   --   --   LABPROT  --    < > 22.9* 20.6* >90.0* 17.1*  INR  --    < > 2.05 1.78 >10.00* 1.40  CREATININE 0.89  --  1.04* 1.10* 1.13*  --    < > = values in this interval not displayed.    Estimated Creatinine Clearance: 49 mL/min (A) (by C-G formula based on SCr of 1.13 mg/dL (H)).  Assessment: 67 year old female to resume warfarin for Afib,  INR goal of 1.5 to 2 due to history of GI bleed with higher INRs  INR on admission = 3, INR today = 1.40  Dose prior to admission = 2.5 mg daily except none on Mondays  Goal of Therapy:  INR = 1.5-2 Monitor platelets by anticoagulation protocol: Yes   Plan:  Warfarin 2.5 mg po x 1 tonight Daily INR  Dwayne A. Levada Dy, PharmD, [place] Pager: [phone] Please utilize [facility] for appropriate phone number to reach the unit pharmacist ([place])    11/16/2017,7:48 AM

## 2017-11-16 NOTE — Discharge Summary (Addendum)
Name: [name] MRN: [id number] DOB: [birth year] 67 y.o. PCP: [name], MD  Date of Admission: 11/13/2017  3:37 PM Date of Discharge:  Attending Physician: Annia Belt, MD  Discharge Diagnosis: 1. Acute on chronic COPD exacerbation 2. Afib on Warfarin 3. Hyperglycemia  Discharge Medications: Allergies as of 11/18/2017      Reactions   Lorazepam Other (See Comments)   Patient's sister noted that ativan caused the patient to become extremely confused during hospitalization 09/2010; tolerates Xanax   Morphine And Related Other (See Comments)   Injection site reaction   Oxycontin [oxycodone] Other (See Comments)   headache   Tramadol Hcl Swelling   Ankle swelling      Medication List    STOP taking these medications   liraglutide 18 MG/3ML Sopn Commonly known as:  VICTOZA     TAKE these medications   ACCU-CHEK FASTCLIX LANCET Kit Check your blood 4 times a day dx code 250.00 insulin requiring   albuterol 108 (90 Base) MCG/ACT inhaler Commonly known as:  PROVENTIL HFA;VENTOLIN HFA Inhale 2 puffs into the lungs every 6 (six) hours as needed for shortness of breath.   ALPRAZolam 1 MG tablet Commonly known as:  XANAX Take 0.5-1 tablets (0.5-1 mg total) by mouth 2 (two) times daily as needed for anxiety. What changed:    when to take this  additional instructions   benzonatate 100 MG capsule Commonly known as:  TESSALON Take 1 capsule (100 mg total) by mouth 3 (three) times daily as needed for cough.   buPROPion 75 MG tablet Commonly known as:  WELLBUTRIN Take 1 tablet (75 mg total) by mouth 2 (two) times daily.   chlorpheniramine 4 MG tablet Commonly known as:  CHLOR-TRIMETON Take 4 mg by mouth daily as needed for allergies.   clobetasol cream 0.05 % Commonly known as:  TEMOVATE Apply 1 application topically 2 (two) times daily as needed (lichen sclerosus flare).   DULoxetine 60 MG capsule Commonly known as:  CYMBALTA Take 1 capsule  (60 mg total) by mouth 2 (two) times daily.   ferrous gluconate 324 MG tablet Commonly known as:  FERGON Take 1 tablet (324 mg total) by mouth 2 (two) times daily with a meal. What changed:    how much to take  when to take this  additional instructions   fluticasone 50 MCG/ACT nasal spray Commonly known as:  FLONASE Place 2 sprays into both nostrils daily. What changed:  when to take this   Fluticasone-Salmeterol 500-50 MCG/DOSE Aepb Commonly known as:  ADVAIR Inhale 1 puff into the lungs 2 (two) times daily.   furosemide 80 MG tablet Commonly known as:  LASIX Take 1 tablet (80 mg total) by mouth daily. Start taking on:  11/19/2017 What changed:  See the new instructions.   gabapentin 300 MG capsule Commonly known as:  NEURONTIN Take 2 capsules (600 mg total) by mouth 3 (three) times daily. What changed:    how much to take  when to take this   glucose blood test strip 1 each by Other route QID. Use as instructed   HYDROcodone-acetaminophen 5-325 MG tablet Commonly known as:  NORCO/VICODIN Take 2 tablets by mouth every 6 (six) hours as needed for severe pain. Start taking on:  12/13/2017 What changed:  when to take this   insulin aspart 100 UNIT/ML FlexPen Commonly known as:  NOVOLOG Inject 15 Units into the skin 4 (four) times daily -  before meals and at bedtime. What changed:  when to take this  additional instructions   Insulin Pen Needle 32G X 4 MM Misc Use to inject insulin up to 6 times a day   Insulin Syringe-Needle U-100 31G X 15/64" 0.5 ML Misc Use to inject insulin up to 4 times a day   metoprolol succinate 25 MG 24 hr tablet Commonly known as:  TOPROL-XL Take 1 tablet (25 mg total) by mouth daily.   nicotine 14 mg/24hr patch Commonly known as:  NICODERM CQ - dosed in mg/24 hours Place 1 patch (14 mg total) onto the skin daily. Start taking on:  11/19/2017   nystatin cream Commonly known as:  MYCOSTATIN Apply 1 application topically  2 (two) times daily as needed (rash).   omeprazole 40 MG capsule Commonly known as:  PRILOSEC Take 1 capsule (40 mg total) by mouth 2 (two) times daily.   ONETOUCH VERIO w/Device Kit 1 each by Does not apply route 4 (four) times daily.   OXYGEN Inhale 2 L into the lungs as needed (for shortness of breath while active).   potassium chloride SA 20 MEQ tablet Commonly known as:  K-DUR,KLOR-CON Take 1 tablet (20 mEq total) by mouth 2 (two) times daily.   predniSONE 20 MG tablet Commonly known as:  DELTASONE Take 2 tablets (40 mg total) by mouth daily with breakfast. Start taking on:  11/19/2017   PRESCRIPTION MEDICATION Inhale into the lungs at bedtime. CPAP   rosuvastatin 20 MG tablet Commonly known as:  CRESTOR Take 1 tablet (20 mg total) by mouth at bedtime.   tiotropium 18 MCG inhalation capsule Commonly known as:  SPIRIVA Place 1 capsule (18 mcg total) into inhaler and inhale daily.   warfarin 2.5 MG tablet Commonly known as:  COUMADIN Take as directed. If you are unsure how to take this medication, talk to your nurse or doctor. Original instructions:  TAKE 1 TABLET BY MOUTH DAILY AT 6 PM EXCEPT ON MONDAYS What changed:  See the new instructions.       Disposition and follow-up:   Ms.Verlaine A Everard was discharged from [facility] in Good condition.  At the hospital follow up visit please address:  1.  COPD Exacerbation      - Please continue home COPD regimen, which includes fluticasone-salmeterol (1 puff BID), Spiriva (1 capsule daily), and albuterol rescue inhaler as needed       - Continue prednisone 55m for 3 more days (last dose on 10/12).       - Continue supplemental oxygen (2L at night and when leaving the house).  Afib on Warfarin - INR within goal at discharge and home dose of warfarin was resumed. - Patient will need INR follow-up as regularly scheduled.  Hyperglycemia - Increased blood sugars as a result of steroids.  - Continue  checking blood sugars at least 4 times per day and administering both mealtime novolog 15u plus sliding scale before meals and bed.  2.  Labs / imaging needed at time of follow-up: INR  3.  Pending labs/ test needing follow-up: None  Follow-up Appointments: Follow-up Information    KOval Linsey MD Follow up.   Specialty:  Internal Medicine Why:  Make an appointment in 1-2 months.  Contact information: [address] [place] [phone]          Hospital Course by problem list: Ms.Elkholyis a 67year old woman with a history ofCOPD (wears 2L supplemental oxygen when sleeping and when leaving her house), pulmonary HTN,mitral valve stenosiss/pmitral  valvereplacement, paroxysmal afibon Warfarin, gastrointestinal AVMs, chronic iron deficiency anemia secondary to GI blood loss, chronic diastolic heart failure(EF 60% on 06/2017 ECHO),OSA on CPAP, obesity, tobacco abuse, diabetes, and history of renal cell carcinomas/p left kidney cryoablation who presented to a routine clinic visit with a two-week history of increased cough and increased sputum production. Her clinical picture is consistent with COPD exacerbation in the setting of rhinovirus infection.  1. Acute on chronic COPD exacerbation 2/2 rhinovirus infection: Initial exam was notable for labored respiratory effort between coughing spells, audible wheezing, posterior basilar rhonchi, and diffuse expiratory wheezes throughout the anterior and posterior lung fields. She was treated with IV steroids, azithromycin, and scheduled breathing treatments. Her home COPD medications were continued during the admission. Her symptoms gradually improved although she continued to have some wheezing on exam. Discharged home with 3 more days of oral prednisone (for a total of 5 day course of prednisone).  2. Afib on Warfarin: INR goal of 2 due to history of GI AVMs and GI bleeds with higher INR. INR on admission was 3, so  warfarin was held. During the remainder of her hospitalization, daily INRs were obtained and warfarin dosing was managed by pharmacy. At discharge, INR was 1.68 and her normal dosing was continued on discharge.  3. DMII: A1c 6.7 on day of admission. Home regimen includes novolog 15u plus sliding scale QID and Victoza. Patient was hyperglycemic during the admission due to the steroid administration. Her Victoza was discontinued while in the hospital, so she will need follow-up with the [facility] pharmacy team to re-start this medication.   Discharge Vitals:   BP (!) 158/71 (BP Location: Right Arm)   Pulse 79   Temp 97.8 F (36.6 C) (Oral)   Resp 18   Ht _0  (1.575 m)   Wt 87.7 kg   SpO2 95%   BMI 35.36 kg/m   Pertinent Labs, Studies, and Procedures:  CBC Latest Ref Rng & Units 11/14/2017 11/13/2017 10/01/2017  WBC 4.0 - 10.5 K/uL 6.9 6.9 8.8  Hemoglobin 12.0 - 15.0 g/dL 11.7(L) 11.3(L) 11.2(L)  Hematocrit 36.0 - 46.0 % 38.3 37.4 37.1  Platelets 150 - 400 K/uL 223 213 178    CMP Latest Ref Rng & Units 11/18/2017 11/17/2017 11/16/2017  Glucose 70 - 99 mg/dL 225(H) 383(H) 446(H)  BUN 8 - 23 mg/dL 26(H) 27(H) 20  Creatinine 0.44 - 1.00 mg/dL 1.00 1.36(H) 1.13(H)  Sodium 135 - 145 mmol/L 135 135 132(L)  Potassium 3.5 - 5.1 mmol/L 3.9 3.8 4.1  Chloride 98 - 111 mmol/L 97(L) 99 97(L)  CO2 22 - 32 mmol/L _1 Calcium 8.9 - 10.3 mg/dL 9.1 9.1 8.6(L)  Total Protein 6.5 - 8.1 g/dL - - -  Total Bilirubin 0.3 - 1.2 mg/dL - - -  Alkaline Phos 38 - 126 U/L - - -  AST 15 - 41 U/L - - -  ALT 14 - 54 U/L - - -   CXR Minimal pulmonary vascular congestion. Calcific atherosclerotic disease and tortuosity of the aorta.  Discharge Instructions: Discharge Instructions    Discharge instructions   Complete by:  As directed    It was a pleasure taking care of you, Ms. Dawkins!  1. You were hospitalized for a COPD exacerbation that caused cough, wheezing, and shortness of breath. This most likely  happened because you had a viral upper respiratory infection. You were treated with anti-inflammatory antibiotics, steroids, and breathing treatments. Please take 3 more days of prednisone 67m  daily to help decrease the inflammation in your lungs. Continue to take your normal COPD inhalers and use your oxygen at home. Please consider quitting smoking to avoid worsening of your lung disease. I have provided prescriptions for nicotine patches as well as tessalon perles (for coughing).  2. No other changes were made to your medications. Please resume your medications as previously prescribed except for the Victoza. I have messaged Dr. [name] to set up an appointment with you to re-start the Victoza.   3. Your blood sugars have been high on the steroids, so be sure to check them regularly and administer your insulin as prescribed. Your sugars should improve once the steroid course is finished.   4. You are being discharged to a rehab facility to help regain your strength after this hospitalization.   5. Follow up with Dr. Eppie Gibson as previously scheduled. Please see a doctor sooner if you experience worsening shortness of breath or chest pain.   Feel free to call our clinic if you have any questions at [phone].  Thanks! Dr. Annie Paras      Signed: Dorrell, Andree Elk, MD 11/18/2017, 3:54 PM   Pager: [phone]

## 2017-11-17 ENCOUNTER — Encounter: Payer: Self-pay | Admitting: Pharmacist

## 2017-11-17 DIAGNOSIS — B348 Other viral infections of unspecified site: Secondary | ICD-10-CM

## 2017-11-17 DIAGNOSIS — R7989 Other specified abnormal findings of blood chemistry: Secondary | ICD-10-CM

## 2017-11-17 DIAGNOSIS — R531 Weakness: Secondary | ICD-10-CM

## 2017-11-17 LAB — BASIC METABOLIC PANEL
Anion gap: 8 (ref 5–15)
BUN: 27 mg/dL — ABNORMAL HIGH (ref 8–23)
CO2: 28 mmol/L (ref 22–32)
Calcium: 9.1 mg/dL (ref 8.9–10.3)
Chloride: 99 mmol/L (ref 98–111)
Creatinine, Ser: 1.36 mg/dL — ABNORMAL HIGH (ref 0.44–1.00)
GFR calc Af Amer: 45 mL/min — ABNORMAL LOW (ref >60)
GFR calc non Af Amer: 39 mL/min — ABNORMAL LOW (ref >60)
Glucose, Bld: 383 mg/dL — ABNORMAL HIGH (ref 70–99)
Potassium: 3.8 mmol/L (ref 3.5–5.1)
Sodium: 135 mmol/L (ref 135–145)

## 2017-11-17 LAB — PROTIME-INR
INR: 1.55
Prothrombin Time: 18.4 seconds — ABNORMAL HIGH (ref 11.4–15.2)

## 2017-11-17 LAB — GLUCOSE, CAPILLARY
Glucose-Capillary: 386 mg/dL — ABNORMAL HIGH (ref 70–99)
Glucose-Capillary: 222 mg/dL — ABNORMAL HIGH (ref 70–99)
Glucose-Capillary: 308 mg/dL — ABNORMAL HIGH (ref 70–99)
Glucose-Capillary: 309 mg/dL — ABNORMAL HIGH (ref 70–99)

## 2017-11-17 MED ORDER — POLYETHYLENE GLYCOL 3350 17 G PO PACK
17.0000 g | PACK | Freq: Every day | ORAL | Status: DC
  Administered 2017-11-17 – 2017-11-18 (×2): 17 g via ORAL
  Filled 2017-11-17 (×2): qty 1

## 2017-11-17 MED ORDER — PREDNISONE 20 MG PO TABS
40.0000 mg | ORAL_TABLET | Freq: Every day | ORAL | Status: DC
  Administered 2017-11-17 – 2017-11-18 (×2): 40 mg via ORAL
  Filled 2017-11-17 (×2): qty 2

## 2017-11-17 MED ORDER — INSULIN GLARGINE 100 UNIT/ML ~~LOC~~ SOLN
12.0000 [IU] | Freq: Every day | SUBCUTANEOUS | Status: DC
  Administered 2017-11-17 – 2017-11-18 (×2): 12 [IU] via SUBCUTANEOUS
  Filled 2017-11-17 (×2): qty 0.12

## 2017-11-17 MED ORDER — IPRATROPIUM-ALBUTEROL 0.5-2.5 (3) MG/3ML IN SOLN
3.0000 mL | Freq: Two times a day (BID) | RESPIRATORY_TRACT | Status: DC
  Administered 2017-11-17 – 2017-11-18 (×3): 3 mL via RESPIRATORY_TRACT
  Filled 2017-11-17 (×3): qty 3

## 2017-11-17 MED ORDER — WARFARIN SODIUM 2.5 MG PO TABS
2.5000 mg | ORAL_TABLET | Freq: Once | ORAL | Status: AC
  Administered 2017-11-17: 2.5 mg via ORAL
  Filled 2017-11-17: qty 1

## 2017-11-17 MED ORDER — INSULIN GLARGINE 100 UNIT/ML ~~LOC~~ SOLN: 10.0000 [IU] | Freq: Every day | SUBCUTANEOUS | Status: DC

## 2017-11-17 NOTE — Clinical Social Work Note (Signed)
Clinical Social Work Assessment  Patient Details  Name: [name] MRN: [id number] Date of Birth: [birth year]  Date of referral:  11/17/17               Reason for consult:  Facility Placement, Discharge Planning                Permission sought to share information with:  Family Supports Permission granted to share information::  Yes, Verbal Permission Granted  Name::     [name]   Agency::     Relationship::  Sister  Contact Information:  [phone]  Housing/Transportation Living arrangements for the past 2 months:  [place] of Information:  Patient Patient Interpreter Needed:  None Criminal Activity/Legal Involvement Pertinent to Current Situation/Hospitalization:  No - Comment as needed Significant Relationships:  Adult Children, Siblings(Daughter Freight forwarder) Lives with:  Self Do you feel safe going back to the place where you live?  No(Patient agreeable to ST rehab.) Need for family participation in patient care:  No (Coment)  Care giving concerns:  Patient reported that she lives alone and her daughter [name] works. She has another daughter that lives in [place]. [name] agreeable to [place] rehab to aid in strengthening before going home.  Social Worker assessment / plan:  CSW talked with patient at the bedside regarding d/c disposition and recommendation of ST rehab. [name] was sitting up in bed and was alert, oriented and agreeable to talking with CSW regarding her discharge plan. Patient reported that she has 2 daughters: [name] lives in [place] and works and her other daughter lives in [place].   When asked, patient reported that she has been to [place] [facility] rehab before, [facility] twice and [facility]. CSW explained facility search process and provided patient with SNF list.  [name] advised that she will be provided with facility responses on Wednesday.    Employment status:  Retired Health and safety inspector) PT Recommendations:  [name] / Referral to community resources:  Skilled Nursing Facility(Patient provided with SNF list)  Patient/Family's Response to care: No concerns expressed by patient regarding her care during hospitalization.  Patient/Family's Understanding of and Emotional Response to Diagnosis, Current Treatment, and Prognosis: Patient appeared to understand the need for rehab before returning home.  Emotional Assessment Appearance:  Appears stated age Attitude/Demeanor/Rapport:  Engaged Affect (typically observed):  Pleasant, Appropriate Orientation:  Oriented to Self, Oriented to Place, Oriented to  Time, Oriented to Situation Alcohol / Substance use:  Tobacco Use, Alcohol Use, Illicit Drugs(Per H&P patient smokes and does not drink or use illicit drugs) Psych involvement (Current and /or in the community):  No (Comment)  Discharge Needs  Concerns to be addressed:  Discharge Planning Concerns Readmission within the last 30 days:  No Current discharge risk:  None Barriers to Discharge:  Continued Medical Work up   [employer] [name], [place] 11/17/2017, 4:07 PM

## 2017-11-17 NOTE — Progress Notes (Signed)
Inpatient Diabetes Program Recommendations  AACE/ADA: New Consensus Statement on Inpatient Glycemic Control (2015)  Target Ranges:  Prepandial:   less than 140 mg/dL      Peak postprandial:   less than 180 mg/dL (1-2 hours)      Critically ill patients:  140 - 180 mg/dL   Results for SIYONA, COTO (MRN [id number]) as of 11/17/2017 10:12  Ref. Range 11/16/2017 07:37 11/16/2017 11:10 11/16/2017 16:51 11/16/2017 20:23 11/16/2017 23:36  Glucose-Capillary Latest Ref Range: 70 - 99 mg/dL 375 (H)  20 units NOVOLOG Given at 9am 290 (H)  11 units NOVOLOG Given at 2pm 465 (H)  26 units NOVOLOG  330 (H)  6 units NOVOLOG  329 (H)  4 units NOVOLOG +  10 units LANTUS   Results for RELLA, EGELSTON (MRN [id number]) as of 11/17/2017 10:12  Ref. Range 11/17/2017 07:38  Glucose-Capillary Latest Ref Range: 70 - 99 mg/dL 386 (H)  26 units NOVOLOG     Home DM Meds: Novolog 15 units QID       Novolog SSI       Victoza 1.8 mg QHS  Current Orders: Lantus 10 units QHS      Novolog Resistant Correction Scale/ SSI (0-20 units) TID AC + HS      Novolog 6 units TID with meals     Last dose Solumedrol given yesterday at 10am.  Now getting Prednisone 40 mg Daily.  Note Lantus increased to 10 units QHS last PM.  AM CBG still elevated this AM to 386 mg/dl.      MD- Please consider the following in-hospital insulin adjustments:  1. Increase Lantus further to 17 units QHS (0.2 units/kg dosing based on weight of 87 kg)  2. Increase Novolog Meal Coverage to: Novolog 10 units TID with meals (2/3 total home dose)     --Will follow patient during hospitalization--  Wyn Quaker RN, MSN, CDE Diabetes Coordinator Inpatient Glycemic Control Team Team Pager: [phone] (8a-5p)

## 2017-11-17 NOTE — Progress Notes (Signed)
ANTICOAGULATION CONSULT NOTE - Follow Up Consult  Pharmacy Consult for Warfarin Indication: atrial fibrillation  Allergies  Allergen Reactions  . Lorazepam Other (See Comments)    Patient's sister noted that ativan caused the patient to become extremely confused during hospitalization 09/2010; tolerates Xanax  . Morphine And Related Other (See Comments)    Injection site reaction  . Oxycontin [Oxycodone] Other (See Comments)    headache  . Tramadol Hcl Swelling    Ankle swelling    Patient Measurements: Height: _0  (157.5 cm) Weight: 193 lb 5.5 oz (87.7 kg) IBW/kg (Calculated) : 50.1  Vital Signs: Temp: 97.9 F (36.6 C) (10/08 0737) Temp Source: Oral (10/08 0737) BP: 151/57 (10/08 0737) Pulse Rate: 74 (10/08 0737)  Labs: Recent Labs    11/15/17 0449 11/16/17 0412 11/16/17 0616 11/17/17 0410  LABPROT 20.6* >90.0* 17.1* 18.4*  INR 1.78 >10.00* 1.40 1.55  CREATININE 1.10* 1.13*  --  1.36*    Estimated Creatinine Clearance: 40.7 mL/min (A) (by C-G formula based on SCr of 1.36 mg/dL (H)).  Assessment: 67 year old female to resume warfarin for Afib,  INR goal of 1.5 to 2 due to history of GI bleed with higher INRs  INR on admission = 3, INR today = 1.55  Dose prior to admission = 2.5 mg daily except none on Mondays  Goal of Therapy:  INR = 1.5-2 Monitor platelets by anticoagulation protocol: Yes   Plan:  Warfarin 2.5 mg po x 1 tonight Daily INR  Dwayne A. Levada Dy, PharmD, [place] Pager: [phone] Please utilize Amion for appropriate phone number to reach the unit pharmacist ([place])    11/17/2017,7:53 AM

## 2017-11-17 NOTE — NC FL2 (Addendum)
[place] LEVEL OF CARE SCREENING TOOL     IDENTIFICATION  Patient Name: [name] Birthdate: February 02, 1951 Sex: female Admission Date (Current Location): 11/13/2017  [facility] and [place] Number:  Herbalist and Address:  The . [facility], [place] [address], [place], [place] [address]      Provider Number: [id number]  Attending Physician Name and Address:  Annia Belt, MD  Relative Name and Phone Number:  [name] - sister, [phone] (mobile), [phone] (home)     Current Level of Care: Hospital Recommended Level of Care: [place] Prior Approval Number:    Date Approved/Denied:   PASRR Number: [id number](Eff. 09/29/10)  Discharge Plan: SNF    Current Diagnoses: Patient Active Problem List   Diagnosis Date Noted  . Steroid-induced hyperglycemia   . COPD exacerbation ([place]) 11/13/2017  . Acute bronchitis   . History of mitral valve replacement with bioprosthetic valve   . Oxygen dependent   . Pulmonary hypertension due to alveolar hypoventilation disorder ([place])   . History of maze procedure   . Urinary retention 10/15/2017  . Lichen sclerosus of female genitalia 01/12/2017  . Mixed stress and urge urinary incontinence 05/27/2016  . Pulmonary hypertension due to chronic obstructive pulmonary disease ([place]) 04/25/2016  . Right ventricular failure ([place]) 04/25/2016  . Seborrheic keratosis 09/28/2015  . Asymptomatic cholelithiasis 09/25/2015  . Esophageal stenosis 08/08/2015  . Arteriovenous malformation of gastrointestinal tract 08/08/2015  . Aortic atherosclerosis ([place]) 10/19/2014  . Diabetes mellitus type 2, insulin dependent ([place])   . Right nephrolithiasis 09/06/2014  . Vulvovaginal candidiasis 09/20/2013  . Chronic daily headache 01/14/2013  . Chronic low back pain 10/06/2012  . Long term current use of anticoagulant therapy 09/30/2012  . Bilateral cataracts 08/04/2012  . Internal and  external hemorrhoids without complication 09/47/0962  . Chronic venous insufficiency 08/04/2012  . Tobacco abuse 07/28/2012  . Allergic rhinitis 06/01/2012  . Severe obesity (BMI 35.0-39.9) with comorbidity ([place]) 10/23/2011  . Clear cell renal cell carcinoma ([place]) 07/21/2011  . Adenomatous polyps 05/14/2011  . Health care maintenance 11/04/2010  . PAF (paroxysmal atrial fibrillation) ([place]) 10/22/2010  . Chronic congestive heart failure with left ventricular diastolic dysfunction ([place]) 10/21/2010  . Fibromyalgia 08/29/2010  . Gastroesophageal reflux disease   . Type 2 diabetes mellitus with diabetic neuropathy ([place])   . Pulmonary emphysema ([place])   . Pulmonary hypertension due to left ventricular diastolic dysfunction ([place])   . Osteoporosis   . OSA (obstructive sleep apnea)   . Mitral stenosis   . Stenosis of right carotid artery   . Hyperlipidemia 11/20/2005  . Moderately severe major depression ([place]) 11/19/2005  . Acute exacerbation of chronic obstructive pulmonary disease (COPD) ([place]) 11/19/2005    Orientation RESPIRATION BLADDER Height & Weight     Self, Time, Situation, Place  Normal(Per H&P, patient uses CPAP and oxygen at home. Not currently on oxygen at hospital.) See additional information below. Continent Weight: 193 lb 5.5 oz (87.7 kg) Height:  _0  (157.5 cm)  BEHAVIORAL SYMPTOMS/MOOD NEUROLOGICAL BOWEL NUTRITION STATUS      Continent Diet(Carb modified)  AMBULATORY STATUS COMMUNICATION OF NEEDS Skin   Limited Assist Verbally Normal                       Personal Care Assistance Level of Assistance  Bathing, Feeding, Dressing Bathing Assistance: Limited assistance Feeding assistance: Independent Dressing Assistance: Limited assistance     Functional Limitations Info  Sight,  Hearing, Speech Sight Info: Impaired(Uses glasses for reading only) Hearing Info: Adequate Speech Info: Adequate    SPECIAL CARE FACTORS FREQUENCY  PT (By licensed PT), OT (By  licensed OT)     PT Frequency: Evaluated 10/8 and a minimum of 2X per weed therapy recommended during acute inpatient stay OT Frequency: (OT ordered but not seen as of 10/8)            Contractures Contractures Info: Not present    Additional Factors Info  Code Status, Allergies, Insulin Sliding Scale Code Status Info: Full Allergies Info: Lorazepam, Morphine & related, Oxycontin (onycodone), Tramadol Hcl   Insulin Sliding Scale Info: 0-20 units 3X daily with meals, 0-5 units daily at bedtime       Current Medications (11/17/2017):  This is the current hospital active medication list Current Facility-Administered Medications  Medication Dose Route Frequency Provider Last Rate Last Dose  . acetaminophen (TYLENOL) tablet 650 mg  650 mg Oral Q6H PRN Kalman Shan Ratliff, DO       Or  . acetaminophen (TYLENOL) suppository 650 mg  650 mg Rectal Q6H PRN Hoffman, Jessica Ratliff, DO      . albuterol (PROVENTIL) (2.5 MG/3ML) 0.083% nebulizer solution 2.5 mg  2.5 mg Nebulization Q4H PRN Kalman Shan Ratliff, DO   2.5 mg at 11/17/17 1630  . ALPRAZolam [name]) tablet 1 mg  1 mg Oral BID PRN Kalman Shan Ratliff, DO   1 mg at 11/17/17 1540  . azithromycin (ZITHROMAX) tablet 250 mg  250 mg Oral Daily Dorrell, Andree Elk, MD   250 mg at 11/16/17 1756  . benzonatate (TESSALON) capsule 100 mg  100 mg Oral TID PRN Kalman Shan Ratliff, DO   100 mg at 11/17/17 1540  . buPROPion [facility]) tablet 75 mg  75 mg Oral BID Kalman Shan Ratliff, DO   75 mg at 11/17/17 0856  . DULoxetine (CYMBALTA) DR capsule 60 mg  60 mg Oral BID Kalman Shan Ratliff, DO   60 mg at 11/17/17 0856  . furosemide (LASIX) tablet 80 mg  80 mg Oral Daily Dorrell, Andree Elk, MD   80 mg at 11/17/17 0857  . gabapentin (NEURONTIN) capsule 600 mg  600 mg Oral BID Kalman Shan Ratliff, DO   600 mg at 11/17/17 0856  . guaiFENesin-dextromethorphan (ROBITUSSIN DM) 100-10 MG/5ML syrup 5 mL  5 mL Oral Q4H PRN  Kalman Shan Ratliff, DO   5 mL at 11/15/17 1241  . HYDROcodone-acetaminophen (NORCO/VICODIN) 5-325 MG per tablet 2 tablet  2 tablet Oral Q6H PRN Valinda Party, DO   2 tablet at 11/17/17 1059  . insulin aspart (novoLOG) injection 0-20 Units  0-20 Units Subcutaneous TID WC Hoffman, Jessica Ratliff, DO   7 Units at 11/17/17 1415  . insulin aspart (novoLOG) injection 0-5 Units  0-5 Units Subcutaneous QHS Valinda Party, DO   4 Units at 11/16/17 2343  . insulin aspart (novoLOG) injection 6 Units  6 Units Subcutaneous TID WC Dorrell, Andree Elk, MD   6 Units at 11/17/17 1415  . insulin glargine (LANTUS) injection 10 Units  10 Units Subcutaneous QHS Corinne Ports, MD   10 Units at 11/16/17 2124  . ipratropium-albuterol (DUONEB) 0.5-2.5 (3) MG/3ML nebulizer solution 3 mL  3 mL Nebulization BID Annia Belt, MD      . MEDLINE mouth rinse  15 mL Mouth Rinse BID Annia Belt, MD   15 mL at 11/17/17 1417  . metoprolol succinate (TOPROL-XL) 24 hr tablet 25 mg  25 mg Oral Daily Kalman Shan Ratliff, DO   25 mg at 11/17/17 0857  . mometasone-formoterol (DULERA) 200-5 MCG/ACT inhaler 2 puff  2 puff Inhalation BID Kalman Shan Ratliff, DO   2 puff at 11/17/17 0836  . nicotine (NICODERM CQ - dosed in mg/24 hours) patch 14 mg  14 mg Transdermal Daily Kalman Shan Ratliff, DO   14 mg at 11/17/17 0859  . ondansetron (ZOFRAN) injection 4 mg  4 mg Intravenous Q6H PRN Hoffman, Jessica Ratliff, DO      . polyethylene glycol (MIRALAX / GLYCOLAX) packet 17 g  17 g Oral Daily Dorrell, Andree Elk, MD   17 g at 11/17/17 1532  . predniSONE (DELTASONE) tablet 40 mg  40 mg Oral Q breakfast Kalman Shan Ratliff, DO   40 mg at 11/17/17 0904  . rosuvastatin (CRESTOR) tablet 20 mg  20 mg Oral QHS Hoffman, Jessica Ratliff, DO   20 mg at 11/16/17 2228  . senna-docusate (Senokot-S) tablet 1 tablet  1 tablet Oral QHS PRN Kalman Shan Ratliff, DO      . warfarin (COUMADIN)  tablet 2.5 mg  2.5 mg Oral ONCE-1800 Pierce, Dwayne A, RPH      . Warfarin - Pharmacist Dosing Inpatient   Does not apply [id number] Annia Belt, MD         Discharge Medications: Please see discharge summary for a list of discharge medications.  Relevant Imaging Results:  Relevant Lab Results:   Additional Information [phone]  Per Respiratory Therapist 11/16/17 note: "Placed patient on CPAP via FFM, previous settings with 2 lpm O2 bleed in". Hospital info: Small full face mask used, respiratory rate 20 breaths/min.; Flow reate 2lpm; Auto titrate max 20, min 5 cm H2O.   Sable Feil, LCSW

## 2017-11-17 NOTE — Care Management Important Message (Signed)
Important Message  Patient Details  Name: [name] MRN: [id number] Date of Birth: Jul 04, 1950   Medicare Important Message Given:  Yes    Iris Montine Circle 11/17/2017, 2:24 PM

## 2017-11-17 NOTE — Progress Notes (Signed)
Medicine attending: I examined this patient today and I concur with the evaluation and management plan as recorded by resident physician Dr Vilma Prader. Patient continues to improve from the respiratory standpoint.  Minimal wheezing.  Some residual cough.  Oxygen saturation 96% on 2 L oxygen which is her home dose. INR at her target range. Recent high-dose steroids have caused significant hyperglycemia.  We are tapering steroids.  Adjusting insulin. We will monitor over the course of the day.  Once glucose in acceptable range she could be discharged either later today or tomorrow. Impression: 1.  Acute exacerbation of chronic obstructive airway disease secondary to rhinovirus infection. 2.  Steroid induced hypoglycemia complicating underlying type 2 insulin-dependent diabetes. 3.  Chronic anticoagulation secondary to paroxysmal atrial fibrillation 4.  Status post bioprosthetic mitral valve replacement 5.  Low target INR in view of recurrent GI bleed felt to be secondary to AVMs.

## 2017-11-17 NOTE — Progress Notes (Addendum)
Inhalers were reviewed with the patient, including name, instructions, indication, goals of therapy, potential side effects, importance of adherence, and safe use.  Patient reports no medication-related concerns, but reports taking 1 puff of Spiriva per day rather than 2. Reinforced Spiriva dosing instructions and provided inhaler samples of Spiriva and albuterol. Also offered patient assistance with smoking cessation, patient agrees, will follow up after hospital discharge.   Also discussed re-titration of liraglutide after discharge. Patient verbalized understanding.

## 2017-11-17 NOTE — Progress Notes (Signed)
Patient not ready for CPAP at this time. Will call when ready.

## 2017-11-17 NOTE — Consult Note (Signed)
[facility] CM Inpatient Consult   11/17/2017  [name] [birth year] [id number]  Patient is currently active with [facility] for chronic disease management services.  Patient has been engaged by a [facility] was referred per notes and BSW for transportation needs.  Patient is being recommended for skilled nursing will follow.  Met with the patient at the bedside.  Patient finishing up her breathing treatment.  Patient continues to be short of breath.  She consents to ongoing [facility] follow up.  She is not sure she will go to a skilled facility.  Will follow.  Of note, [facility] Care Management services does not replace or interfere with any services that are needed or arranged by inpatient case management or social work.  For additional questions or referrals please contact:  Natividad Brood, RN BSN [facility] Liaison  [phone] business mobile phone Toll free office [phone]

## 2017-11-17 NOTE — Evaluation (Signed)
Physical Therapy Evaluation Patient Details Name: [name] MRN: [id number] DOB: 05/26/1950 Today's Date: 11/17/2017   History of Present Illness  Pt is a 67 y/o female admitted secondary to worsening SOB secondary to COPD exacerbation. PMH includes dCHF, DM, a fib, s/p MVR, renal cell carcinoma s/p L kidney cryoablation, COPD on 2L at night and when leaving home.   Clinical Impression  Pt admitted secondary to problem above with deficits below. Pt very shaky during transfers and only able to tolerate very short distance to chair following transfer to [facility] secondary to fatigue and abdominal pain. Required min A for mobility this session. Pt currently lives alone and does not have anyone that can assist her at home. Feel she would benefit from short term SNF at d/c. Will continue to follow acutely to maximize functional mobility independence and safety.     Follow Up Recommendations SNF;Supervision for mobility/OOB    Equipment Recommendations  Rolling walker with 5" wheels    Recommendations for Other Services OT consult     Precautions / Restrictions Precautions Precautions: Fall Restrictions Weight Bearing Restrictions: No      Mobility  Bed Mobility Overal bed mobility: Needs Assistance Bed Mobility: Supine to Sit     Supine to sit: Min assist     General bed mobility comments: Min A for trunk elevation. Increased time required to perform   Transfers Overall transfer level: Needs assistance Equipment used: 1 person hand held assist Transfers: Sit to/from [facility] Sit to Stand: Min assist Stand pivot transfers: Min assist       General transfer comment: Min A for lift assist and steadying to stand. Pt very shaky during transfer to [facility] and required min A for steadying. Pt reports she had not gotten up in about 4-5 days, so feels very weak.   Ambulation/Gait Ambulation/Gait assistance: Min assist Gait Distance (Feet): 1 Feet Assistive  device: Rolling walker (2 wheeled) Gait Pattern/deviations: Step-through pattern;Decreased stride length Gait velocity: Decreased    General Gait Details: Slow, very shaky gait. Pt reports increased comfort using RW, however, remained shaky. Only able to tolerate short gait distance from [facility] to recliner secondary to decreased activity tolerance.   Stairs            Wheelchair Mobility    Modified Rankin (Stroke Patients Only)       Balance Overall balance assessment: Needs assistance Sitting-balance support: No upper extremity supported;Feet supported Sitting balance-Leahy Scale: Fair     Standing balance support: Single extremity supported;Bilateral upper extremity supported;During functional activity Standing balance-Leahy Scale: Poor Standing balance comment: Reliant on BUE support                              Pertinent Vitals/Pain Pain Assessment: Faces Faces Pain Scale: Hurts even more Pain Location: abdominal pain  Pain Descriptors / Indicators: Grimacing;Guarding Pain Intervention(s): Monitored during session;Limited activity within patient's tolerance;Repositioned    Home Living Family/patient expects to be discharged to:: Private residence Living Arrangements: Alone Available Help at Discharge: Available PRN/intermittently;Family Type of Home: House Home Access: Stairs to enter Entrance Stairs-Rails: None Entrance Stairs-Number of Steps: 1 Home Layout: One level Home Equipment: [place] - 4 wheels;Bedside commode Additional Comments: Pt reports sister can check on her occasionally, however, does not have any other help at home.     Prior Function Level of Independence: Independent  Hand Dominance        Extremity/Trunk Assessment   Upper Extremity Assessment Upper Extremity Assessment: Defer to OT evaluation    Lower Extremity Assessment Lower Extremity Assessment: Generalized weakness    Cervical / Trunk  Assessment Cervical / Trunk Assessment: Kyphotic  Communication   Communication: No difficulties  Cognition Arousal/Alertness: Awake/alert Behavior During Therapy: WFL for tasks assessed/performed Overall Cognitive Status: Within Functional Limits for tasks assessed                                        General Comments General comments (skin integrity, edema, etc.): Pt very concerned about returning home at d/c as she does not have anyone to assist her.     Exercises     Assessment/Plan    PT Assessment Patient needs continued PT services  PT Problem List Decreased balance;Decreased mobility;Decreased knowledge of precautions;Decreased activity tolerance;Decreased strength;Decreased knowledge of use of DME;Pain       PT Treatment Interventions Gait training;DME instruction;Stair training;Functional mobility training;Therapeutic exercise;Therapeutic activities;Balance training;Patient/family education    PT Goals (Current goals can be found in the Care Plan section)  Acute Rehab PT Goals Patient Stated Goal: to get stronger before going home  PT Goal Formulation: With patient Time For Goal Achievement: 12/01/17 Potential to Achieve Goals: Good    Frequency Min 2X/week   Barriers to discharge Decreased caregiver support      Co-evaluation               AM-PAC PT "6 Clicks" Daily Activity  Outcome Measure Difficulty turning over in bed (including adjusting bedclothes, sheets and blankets)?: A Little Difficulty moving from lying on back to sitting on the side of the bed? : Unable Difficulty sitting down on and standing up from a chair with arms (e.g., wheelchair, bedside commode, etc,.)?: Unable Help needed moving to and from a bed to chair (including a wheelchair)?: A Little Help needed walking in hospital room?: A Little Help needed climbing 3-5 steps with a railing? : A Lot 6 Click Score: 13    End of Session   Activity Tolerance: Patient  limited by fatigue Patient left: in chair;with call bell/phone within reach;with chair alarm set;with nursing/sitter in room Nurse Communication: Mobility status;Other (comment)(linens need to be replaced ) PT Visit Diagnosis: Unsteadiness on feet (R26.81);Muscle weakness (generalized) (M62.81)    Time: 3903-0092 PT Time Calculation (min) (ACUTE ONLY): 30 min   Charges:   PT Evaluation $PT Eval Moderate Complexity: 1 Mod PT Treatments $Therapeutic Activity: 8-22 mins        Leighton Ruff, PT, DPT  Acute Rehabilitation Services  Pager: [phone] Office: [phone]   Rudean Hitt 11/17/2017, 12:25 PM

## 2017-11-17 NOTE — Progress Notes (Addendum)
Subjective: No overnight events. Ms. Gambrill reports that she continues to have cough, but her breathing is improved. She reports that she feels weak after spending a few days in her hospital bed. She has no new symptoms or concerns today.   Objective:  Vital signs in last 24 hours: Vitals:   11/16/17 1957 11/16/17 2002 11/16/17 2230 11/17/17 0109  BP:   (!) 148/80   Pulse:  89 80   Resp:  19 18   Temp:   98.4 F (36.9 C)   TempSrc:   Oral   SpO2: 95% 99% 96% 95%  Weight:      Height:       Constitutional:Laying comfortably in bed. Cardiovascular:Normal rateand regular rhythm. No murmurs, rubs, or gallops. Pulmonary/Chest:Effort normalon room air. No audible wheezes. Poor air movement in the posterior lung fields, but no wheezing heard. Mild expiratory wheezing present in the anterior lung fields. Skin: Warm and dry. No rashes or wounds.  Assessment/Plan:  Active Problems:   OSA (obstructive sleep apnea)   PAF (paroxysmal atrial fibrillation) (HCC)   Diabetes mellitus type 2, insulin dependent (HCC)   COPD exacerbation (HCC)   Acute bronchitis   History of mitral valve replacement with bioprosthetic valve   Oxygen dependent   Pulmonary hypertension due to alveolar hypoventilation disorder ([place])   History of maze procedure  Ms.Elkholyis a 67 year old woman with a history ofCOPD (wears 2L supplemental oxygen when sleeping and when leaving her house), pulmonary HTN,mitral valve stenosiss/pmitral valvereplacement, paroxysmal afibon Warfarin, gastrointestinal AVMs, chronic iron deficiency anemia secondary to GI blood loss, chronic diastolic heart failure(EF 60% on 06/2017 ECHO),OSA on CPAP, obesity, tobacco abuse, diabetes, and history of renal cell carcinomas/p left kidney cryoablation who presented to a routine clinic visit with a two-week history of increased cough and increased sputum production. Her clinical picture is consistent with COPD exacerbation in the  setting of rhinovirus infection.  COPD Exacerbation  -Patient continues to improve, however she still has wheezing in the anterior lung fields. Will transition to PO steroids today. Last day of azithromycin. Plan - Prednisone 39m daily - POazithromycin2531mdaily (day 5/5) - Zofran 43m41mV q6hrs for nausea - duonebsTID scheduled - albuterol nebs q4hrs PRN  - continue home ICS-LABA (fluticasone-salmeterol) and spiriva - Tessalon perles 100m48mD PRN - Robitussin DM q4hrs PRN - supplemental oxygen as needed(wears 2L at night at baseline)  Afib on Warfarin - INR goal of1.5-2 due to history of GI AVMs and GI bleeds with higher INR.INR today is 1.5. Pharmacy has been consulted for coumadin management. Plan -Daily INR - Warfarin 2.5mg 72may  DMII - A1c 6.7 on day of admission. Home regimen includesnovolog 15u QID and Victoza. She has been hyperglycemic during this admission due to the steroids. Blood sugars over the past 24 hours ranged from 290 to 465. She was 383 this morning. Hyperglycemia should improve with switching to PO steroids.  Plan - CBG QID  - Increase form Lantus 10u qhs to BID -Novolog 6u TID with meals - SSI, resistant  Cr Elevation - Cr increased from 1.13 to 1.36 and BUN increased from 20 to 27. Most likely due to hyperglycemia. The patient is eating and drinking well and has a history of heart failure, so will avoid IVF at this time. Expect that her renal function will improve as her hyperglycemia improves. Plan - am BMP  Weakness - PT consulted  Dispo: Anticipated discharge approximately tomorrow.  Dorrell, DeborAndree Elk10/09/2017, 6:32 AM Pager: [phone]

## 2017-11-18 DIAGNOSIS — J449 Chronic obstructive pulmonary disease, unspecified: Secondary | ICD-10-CM | POA: Diagnosis not present

## 2017-11-18 DIAGNOSIS — B348 Other viral infections of unspecified site: Secondary | ICD-10-CM | POA: Diagnosis not present

## 2017-11-18 DIAGNOSIS — R0689 Other abnormalities of breathing: Secondary | ICD-10-CM | POA: Diagnosis not present

## 2017-11-18 DIAGNOSIS — J441 Chronic obstructive pulmonary disease with (acute) exacerbation: Secondary | ICD-10-CM | POA: Diagnosis not present

## 2017-11-18 DIAGNOSIS — R488 Other symbolic dysfunctions: Secondary | ICD-10-CM | POA: Diagnosis not present

## 2017-11-18 DIAGNOSIS — H04123 Dry eye syndrome of bilateral lacrimal glands: Secondary | ICD-10-CM | POA: Diagnosis not present

## 2017-11-18 DIAGNOSIS — I252 Old myocardial infarction: Secondary | ICD-10-CM | POA: Diagnosis not present

## 2017-11-18 DIAGNOSIS — D500 Iron deficiency anemia secondary to blood loss (chronic): Secondary | ICD-10-CM | POA: Diagnosis not present

## 2017-11-18 DIAGNOSIS — M6281 Muscle weakness (generalized): Secondary | ICD-10-CM | POA: Diagnosis not present

## 2017-11-18 DIAGNOSIS — I480 Paroxysmal atrial fibrillation: Secondary | ICD-10-CM | POA: Diagnosis not present

## 2017-11-18 DIAGNOSIS — H43813 Vitreous degeneration, bilateral: Secondary | ICD-10-CM | POA: Diagnosis not present

## 2017-11-18 DIAGNOSIS — G4733 Obstructive sleep apnea (adult) (pediatric): Secondary | ICD-10-CM | POA: Diagnosis not present

## 2017-11-18 DIAGNOSIS — Z743 Need for continuous supervision: Secondary | ICD-10-CM | POA: Diagnosis not present

## 2017-11-18 DIAGNOSIS — I5032 Chronic diastolic (congestive) heart failure: Secondary | ICD-10-CM | POA: Diagnosis not present

## 2017-11-18 DIAGNOSIS — R531 Weakness: Secondary | ICD-10-CM | POA: Diagnosis not present

## 2017-11-18 DIAGNOSIS — Q2733 Arteriovenous malformation of digestive system vessel: Secondary | ICD-10-CM | POA: Diagnosis not present

## 2017-11-18 DIAGNOSIS — I050 Rheumatic mitral stenosis: Secondary | ICD-10-CM | POA: Diagnosis not present

## 2017-11-18 DIAGNOSIS — E119 Type 2 diabetes mellitus without complications: Secondary | ICD-10-CM | POA: Diagnosis not present

## 2017-11-18 DIAGNOSIS — H53002 Unspecified amblyopia, left eye: Secondary | ICD-10-CM | POA: Diagnosis not present

## 2017-11-18 DIAGNOSIS — H05243 Constant exophthalmos, bilateral: Secondary | ICD-10-CM | POA: Diagnosis not present

## 2017-11-18 DIAGNOSIS — R41841 Cognitive communication deficit: Secondary | ICD-10-CM | POA: Diagnosis not present

## 2017-11-18 DIAGNOSIS — R279 Unspecified lack of coordination: Secondary | ICD-10-CM | POA: Diagnosis not present

## 2017-11-18 DIAGNOSIS — I110 Hypertensive heart disease with heart failure: Secondary | ICD-10-CM | POA: Diagnosis not present

## 2017-11-18 DIAGNOSIS — I509 Heart failure, unspecified: Secondary | ICD-10-CM | POA: Diagnosis not present

## 2017-11-18 DIAGNOSIS — E1165 Type 2 diabetes mellitus with hyperglycemia: Secondary | ICD-10-CM | POA: Diagnosis not present

## 2017-11-18 DIAGNOSIS — Z961 Presence of intraocular lens: Secondary | ICD-10-CM | POA: Diagnosis not present

## 2017-11-18 DIAGNOSIS — I2729 Other secondary pulmonary hypertension: Secondary | ICD-10-CM | POA: Diagnosis not present

## 2017-11-18 DIAGNOSIS — R2689 Other abnormalities of gait and mobility: Secondary | ICD-10-CM | POA: Diagnosis not present

## 2017-11-18 DIAGNOSIS — J209 Acute bronchitis, unspecified: Secondary | ICD-10-CM | POA: Diagnosis not present

## 2017-11-18 DIAGNOSIS — J439 Emphysema, unspecified: Secondary | ICD-10-CM | POA: Diagnosis not present

## 2017-11-18 LAB — BASIC METABOLIC PANEL
Anion gap: 9 (ref 5–15)
BUN: 26 mg/dL — ABNORMAL HIGH (ref 8–23)
CO2: 29 mmol/L (ref 22–32)
Calcium: 9.1 mg/dL (ref 8.9–10.3)
Chloride: 97 mmol/L — ABNORMAL LOW (ref 98–111)
Creatinine, Ser: 1 mg/dL (ref 0.44–1.00)
GFR calc Af Amer: >60 mL/min (ref >60)
GFR calc non Af Amer: 57 mL/min — ABNORMAL LOW (ref >60)
Glucose, Bld: 225 mg/dL — ABNORMAL HIGH (ref 70–99)
Potassium: 3.9 mmol/L (ref 3.5–5.1)
Sodium: 135 mmol/L (ref 135–145)

## 2017-11-18 LAB — GLUCOSE, CAPILLARY
Glucose-Capillary: 270 mg/dL — ABNORMAL HIGH (ref 70–99)
Glucose-Capillary: 254 mg/dL — ABNORMAL HIGH (ref 70–99)
Glucose-Capillary: 307 mg/dL — ABNORMAL HIGH (ref 70–99)
Glucose-Capillary: 363 mg/dL — ABNORMAL HIGH (ref 70–99)

## 2017-11-18 LAB — PROTIME-INR
INR: 1.68
Prothrombin Time: 19.6 seconds — ABNORMAL HIGH (ref 11.4–15.2)

## 2017-11-18 MED ORDER — FUROSEMIDE 80 MG PO TABS: 80.0000 mg | ORAL_TABLET | Freq: Every day | ORAL | 0 refills | Status: DC

## 2017-11-18 MED ORDER — WARFARIN SODIUM 2.5 MG PO TABS
2.5000 mg | ORAL_TABLET | Freq: Once | ORAL | Status: AC
  Administered 2017-11-18: 2.5 mg via ORAL
  Filled 2017-11-18: qty 1

## 2017-11-18 MED ORDER — PREDNISONE 20 MG PO TABS: 40.0000 mg | ORAL_TABLET | Freq: Every day | ORAL | 0 refills | Status: DC

## 2017-11-18 MED ORDER — FLEET ENEMA 7-19 GM/118ML RE ENEM
1.0000 | ENEMA | Freq: Once | RECTAL | Status: DC | PRN
  Filled 2017-11-18: qty 1

## 2017-11-18 MED ORDER — MAGNESIUM HYDROXIDE 400 MG/5ML PO SUSP: 30.0000 mL | Freq: Once | ORAL | Status: DC | PRN

## 2017-11-18 MED ORDER — INSULIN ASPART 100 UNIT/ML ~~LOC~~ SOLN
15.0000 [IU] | Freq: Three times a day (TID) | SUBCUTANEOUS | Status: DC
  Administered 2017-11-18: 15 [IU] via SUBCUTANEOUS

## 2017-11-18 MED ORDER — NICOTINE 14 MG/24HR TD PT24: 14.0000 mg | MEDICATED_PATCH | Freq: Every day | TRANSDERMAL | 0 refills | Status: DC

## 2017-11-18 MED ORDER — INSULIN ASPART 100 UNIT/ML ~~LOC~~ SOLN
10.0000 [IU] | Freq: Three times a day (TID) | SUBCUTANEOUS | Status: DC
  Administered 2017-11-18: 10 [IU] via SUBCUTANEOUS

## 2017-11-18 MED ORDER — BENZONATATE 100 MG PO CAPS: 100.0000 mg | ORAL_CAPSULE | Freq: Three times a day (TID) | ORAL | 0 refills | Status: DC | PRN

## 2017-11-18 NOTE — Evaluation (Signed)
Occupational Therapy Evaluation Patient Details Name: [name] MRN: [id number] DOB: 1950/03/07 Today's Date: 11/18/2017    History of Present Illness Pt is a 67 y/o female admitted secondary to worsening SOB secondary to COPD exacerbation. PMH includes dCHF, DM, a fib, s/p MVR, renal cell carcinoma s/p L kidney cryoablation, COPD on 2L at night and when leaving home.    Clinical Impression   Pt typically functions independently. Reports using 02 intermittently. Pt presents with impaired standing balance, generalized weakness and decreased activity tolerance. She requires set up to max assist for ADL. Pt lives alone. Recommending short rehab stay in SNF prior to return home. Will follow acutely.    Follow Up Recommendations  SNF;Supervision/Assistance - 24 hour    Equipment Recommendations  None recommended by OT    Recommendations for Other Services       Precautions / Restrictions Precautions Precautions: Fall Restrictions Weight Bearing Restrictions: No      Mobility Bed Mobility Overal bed mobility: Needs Assistance Bed Mobility: Sit to Supine       Sit to supine: Supervision      Transfers Overall transfer level: Needs assistance Equipment used: 1 person hand held assist Transfers: Sit to/from Stand;Stand Pivot Transfers Sit to Stand: Min assist Stand pivot transfers: Min assist       General transfer comment: min assist to rise and take several steps from BSC>chair>bed.    Balance Overall balance assessment: Needs assistance Sitting-balance support: No upper extremity supported;Feet supported Sitting balance-Leahy Scale: Fair       Standing balance-Leahy Scale: Poor Standing balance comment: mild unsteadiness                            ADL either performed or assessed with clinical judgement   ADL Overall ADL's : Needs assistance/impaired Eating/Feeding: Independent;Sitting   Grooming: Wash/dry hands;Wash/dry face;Sitting;Set  up   Upper Body Bathing: Minimal assistance;Sitting   Lower Body Bathing: Minimal assistance;Sit to/from stand   Upper Body Dressing : Set up;Sitting   Lower Body Dressing: Minimal assistance;Sit to/from stand   Toilet Transfer: Minimal assistance;Stand-pivot;BSC   Toileting- Clothing Manipulation and Hygiene: Maximal assistance;Sit to/from stand               Vision Patient Visual Report: No change from baseline       Perception     Praxis      Pertinent Vitals/Pain Pain Assessment: No/denies pain     Hand Dominance Right   Extremity/Trunk Assessment Upper Extremity Assessment Upper Extremity Assessment: Overall WFL for tasks assessed   Lower Extremity Assessment Lower Extremity Assessment: Defer to PT evaluation       Communication Communication Communication: No difficulties   Cognition Arousal/Alertness: Awake/alert Behavior During Therapy: WFL for tasks assessed/performed Overall Cognitive Status: Within Functional Limits for tasks assessed                                     General Comments       Exercises     Shoulder Instructions      Home Living Family/patient expects to be discharged to:: Private residence Living Arrangements: Alone Available Help at Discharge: Available PRN/intermittently;Family Type of Home: House Home Access: Stairs to enter [employer] of Steps: 1 Entrance Stairs-Rails: None Home Layout: One level     Bathroom Shower/Tub: Teacher, early years/pre: Standard  Home Equipment: [place] - 4 wheels;Bedside commode   Additional Comments: Pt reports sister can check on her occasionally, however, does not have any other help at home.       Prior Functioning/Environment Level of Independence: Independent                 OT Problem List: Decreased strength;Decreased activity tolerance;Impaired balance (sitting and/or standing);Decreased knowledge of use of DME or  AE;Cardiopulmonary status limiting activity      OT Treatment/Interventions: Self-care/ADL training;DME and/or AE instruction;Patient/family education;Energy conservation;Therapeutic activities;Balance training    OT Goals(Current goals can be found in the care plan section) Acute Rehab OT Goals Patient Stated Goal: to get stronger before going home  OT Goal Formulation: With patient Time For Goal Achievement: 12/02/17 Potential to Achieve Goals: Good  OT Frequency: Min 2X/week   Barriers to D/C: Decreased caregiver support          Co-evaluation              AM-PAC PT "6 Clicks" Daily Activity     Outcome Measure Help from another person eating meals?: None Help from another person taking care of personal grooming?: A Little Help from another person toileting, which includes using toliet, bedpan, or urinal?: A Lot Help from another person bathing (including washing, rinsing, drying)?: A Little Help from another person to put on and taking off regular upper body clothing?: A Little Help from another person to put on and taking off regular lower body clothing?: A Little 6 Click Score: 18   End of Session Equipment Utilized During Treatment: Gait belt;Oxygen  Activity Tolerance: Patient tolerated treatment well Patient left: in bed;with call bell/phone within reach;with bed alarm set  OT Visit Diagnosis: Unsteadiness on feet (R26.81);Muscle weakness (generalized) (M62.81)                Time: 0871-9941 OT Time Calculation (min): 21 min Charges:  OT General Charges $OT Visit: 1 Visit OT Evaluation $OT Eval Moderate Complexity: 1 Mod  Nestor Lewandowsky, OTR/L Acute Rehabilitation Services Pager: [phone] Office: [phone]  Malka So 11/18/2017, 2:48 PM

## 2017-11-18 NOTE — Progress Notes (Signed)
Tried reaching Therapist, sports at [place], no answer from the nursing station.

## 2017-11-18 NOTE — Progress Notes (Signed)
Patient stated she didn't know when she would be ready for CPAP. RT instructed patient to call when she was ready for CPAP.

## 2017-11-18 NOTE — Progress Notes (Signed)
Patient had a bowel movement after soap sud enema.

## 2017-11-18 NOTE — Progress Notes (Signed)
Patient A&Ox4. NAD noted. All night medication given including pain medication and insulin. EMS took patient without AVS printed. Day nurse reported to me that she had called [place] on her shift to give report. AVS will be faxed upon completion by the physician.

## 2017-11-18 NOTE — Progress Notes (Signed)
ANTICOAGULATION CONSULT NOTE - Follow Up Consult  Pharmacy Consult for Warfarin Indication: atrial fibrillation  Allergies  Allergen Reactions  . Lorazepam Other (See Comments)    Patient's sister noted that ativan caused the patient to become extremely confused during hospitalization 09/2010; tolerates Xanax  . Morphine And Related Other (See Comments)    Injection site reaction  . Oxycontin [Oxycodone] Other (See Comments)    headache  . Tramadol Hcl Swelling    Ankle swelling    Patient Measurements: Height: 5' 2" (157.5 cm) Weight: 193 lb 5.5 oz (87.7 kg) IBW/kg (Calculated) : 50.1  Vital Signs: Temp: 97.8 F (36.6 C) (10/09 0737) Temp Source: Oral (10/09 0737) BP: 158/71 (10/09 0737) Pulse Rate: 79 (10/09 0737)  Labs: Recent Labs    11/16/17 0412 11/16/17 0616 11/17/17 0410 11/18/17 0521  LABPROT >90.0* 17.1* 18.4* 19.6*  INR >10.00* 1.40 1.55 1.68  CREATININE 1.13*  --  1.36* 1.00    Estimated Creatinine Clearance: 55.3 mL/min (by C-G formula based on SCr of 1 mg/dL).  Assessment: 67 year old female to resume warfarin for Afib,  INR goal of 1.5 to 2 due to history of GI bleed with higher INRs.  INR on admission = 3, INR today is within goal range at 1.68. PO intake is variable from meal to meal. S/p azithromycin 10/4-8  Dose prior to admission = 2.5 mg daily except none on Mondays  Goal of Therapy:  INR = 1.5-2 Monitor platelets by anticoagulation protocol: Yes   Plan:  Warfarin 2.5 mg po x 1 tonight Monitor daily INR, CBC, clinical course, s/sx of bleed, PO intake, DDI    Thank you for allowing [place] to participate in this patients care.   Jens Som, PharmD Please utilize [facility] (under [place]) for appropriate number for your unit pharmacist. 11/18/2017 12:29 PM

## 2017-11-18 NOTE — Clinical Social Work Placement (Signed)
CLINICAL SOCIAL WORK PLACEMENT  NOTE 11/18/17 - DISCHARGED TO [facility] VIA AMBULANCE  Date:  11/18/2017  Patient Details  Name: [name] MRN: [id number] Date of Birth: [birth year]  Clinical Social Work is seeking post-discharge placement for this patient at the [place] level of care (*CSW will initial, date and re-position this form in  chart as items are completed):  Yes   Patient/family provided with [place] Work Department's list of facilities offering this level of care within the geographic area requested by the patient (or if unable, by the patient's family).  Yes   Patient/family informed of their freedom to choose among providers that offer the needed level of care, that participate in Medicare, Medicaid or managed care program needed by the patient, have an available bed and are willing to accept the patient.  Yes   Patient/family informed of [facility]'s ownership interest in [facility] and [facility], as well as of the fact that they are under no obligation to receive care at these facilities.  PASRR submitted to EDS on       PASRR number received on       Existing PASRR number confirmed on 11/17/17     FL2 transmitted to all facilities in geographic area requested by pt/family on 11/17/17     FL2 transmitted to all facilities within larger geographic area on       Patient informed that his/her managed care company has contracts with or will negotiate with certain facilities, including the following:        Yes   Patient/family informed of bed offers received.  Patient chooses bed at [facility]     Physician recommends and patient chooses bed at      Patient to be transferred to [facility] on 11/18/17.  Patient to be transferred to facility by Ambulance     Patient family notified on 11/18/17 of transfer.  Name of family member notified:  Sister [name] - [phone]     PHYSICIAN        Additional Comment:    _______________________________________________ Sable Feil, LCSW 11/18/2017, 3:45 PM

## 2017-11-18 NOTE — Progress Notes (Signed)
Medicine attending: I examined this patient today and I concur with the evaluation and management plan as recorded by resident physician Dr Vilma Prader. Respiratory status continues to slowly improved.  Minimal expiratory wheezing primarily anterior chest. Wide fluctuations in glucose.  However we are making progress with glucose control as we taper her steroids. Renal function back to normal following a mild bump in her BUN and creatinine yesterday.  Artifact?. INR stable in her target range. Ready for discharge when skilled nursing bed available.

## 2017-11-18 NOTE — Progress Notes (Addendum)
Subjective: No overnight events. [name] reports that she feels sweaty and is frustrated that her blood sugars continue to be high. She is wearing supplemental oxygen this morning, but reports she hasn't had any shortness of breath and doesn't know why she's wearing it. She has no new symptoms or concerns today.  Objective:  Vital signs in last 24 hours: Vitals:   11/17/17 1625 11/17/17 1959 11/17/17 2141 11/18/17 0545  BP: (!) 159/54  (!) 172/63 (!) 168/65  Pulse: 78  83 80  Resp: _0 Temp: 97.8 F (36.6 C)   98.6 F (37 C)  TempSrc: Oral   Oral  SpO2: 90% 92% 93% 95%  Weight:      Height:       Constitutional:Sitting up comfortably in bed. Cardiovascular:Normal rateand regular rhythm. No murmurs, rubs, or gallops. Pulmonary/Chest:Effort normalon room air. No audible wheezes. Diffuse expiratory wheezing throughout. Skin: Warm and dry. No rashes or wounds.  Assessment/Plan:  Active Problems:   OSA (obstructive sleep apnea)   PAF (paroxysmal atrial fibrillation) (HCC)   Diabetes mellitus type 2, insulin dependent (HCC)   COPD exacerbation (HCC)   Acute bronchitis   History of mitral valve replacement with bioprosthetic valve   Oxygen dependent   Pulmonary hypertension due to alveolar hypoventilation disorder (HCC)   History of maze procedure   Steroid-induced hyperglycemia  KathrynElkholyis a 67 year old woman with a history ofCOPD (wears 2L supplemental oxygen when sleeping and when leaving her house), pulmonary HTN,mitral valve stenosiss/pmitral valvereplacement, paroxysmal afibon Warfarin, gastrointestinal AVMs, chronic iron deficiency anemia secondary to GI blood loss, chronic diastolic heart failure(EF 60% on 06/2017 ECHO),OSA on CPAP, obesity, tobacco abuse, diabetes, and history of renal cell carcinomas/p left kidney cryoablation who presented to a routine clinic visit with a two-week history of increased cough and increased sputum production. Her  clinical picture is consistent with COPD exacerbationin the setting of rhinovirus infection.  COPD Exacerbation  -Patientcontinues to improve. She continues to have some mild expiratory wheezes, which she does not have at baseline according to a 07/03/17 note from her PCP. Will continue prednisone course for 5 days total. Finished 5-day course of azithromycin. She has worked with PT who recommend discharge to SNF. She is agreeable to this. She is medically appropriate for discharge to SNF pending bed availability. Will transition back to her home COPD inhaler regimen at discharge. She has been seen by [facility] pharmacist Dr. [name], who went over her inhaler regimen and smoking cessation assistance.  Plan -Prednisone 86m daily (day 2/5) - Zofran 420mIV q6hrs for nausea - duonebsTID scheduled - albuterol nebs q4hrs PRN  - continue home ICS-LABA (fluticasone-salmeterol) and spiriva - Tessalon perles 10037mID PRN - Robitussin DM q4hrs PRN - supplemental oxygen as needed(wears 2L at night at baseline)  Afib on Warfarin - INR goal of1.5-2 due to history of GI AVMs and GI bleeds with higher INR.INR today is 1.68. Plan - Pharmacy following -Daily INR - Warfarin 2.5mg73mday  DMII - A1c 6.7 on day of admission. Home regimen includesnovolog 15u QID plus SSI and Victoza.She has been hyperglycemic during this admission due to the steroids. Blood sugars over the past 24 hours ranged from 222 to 309. She was 225 this morning. Will continue home diabetes regimen at discharge.  Plan - CBG QID - Lantus 12u qhs -Novolog 10u TID with meals - SSI, resistant  Cr Elevation - Cr has resolved to normal today at 1.00.  Dispo: Anticipated discharge pending SNF  placement.  [name], Kathryn Elk, MD 11/18/2017, 6:43 AM Pager: [phone]

## 2017-11-19 ENCOUNTER — Telehealth: Payer: Self-pay | Admitting: *Deleted

## 2017-11-19 NOTE — Telephone Encounter (Signed)
Late Entry:  11/17/2017 Received fax from pt's pharmacy -needed to clarify Victoza dose, discussed with pcp,current Victoza dose is 1.8.  Pharmacy made aware of current dose on 10/8.  No further action needed, phone call complete.Regenia Skeeter, Darlene Cassady10/10/20199:15 AM

## 2017-11-20 ENCOUNTER — Encounter: Payer: Self-pay | Admitting: *Deleted

## 2017-11-20 ENCOUNTER — Other Ambulatory Visit: Payer: Self-pay | Admitting: *Deleted

## 2017-11-20 NOTE — Patient Outreach (Signed)
[name]) Care [facility]  11/20/2017  [name] [birth year] [id number]  CSW was able to make initial contact with patient today to perform the initial assessment, as well as assess and assist with social work needs and services.  CSW met with patient at [facility] [place] where patient currently resides to receive short-term rehabilitative services.  CSW introduced self, explained role and types of services provided through [facility] ([place] [facility]).  CSW further explained to patient that CSW works with [name], [facility], also with [name]. CSW then explained the reason for the call, indicating that [name] thought that patient would benefit from social work services and resources to assist with discharge planning needs and services from the skilled nursing facility.  CSW obtained two HIPAA compliant identifiers from patient, which included patient's name and date of birth.  Patient appeared to be in good spirits today, laughing about how she has not received a hot meal since residing at [facility].  CSW inquired as to whether or not patient's sister, [name] and/or daughter, [name] can bring her food to the facility, as patient reports not having much of an appetite because she food is cold and bland.  Patient reported that she is on a restricted diet; therefore, she has to eat the food that is provided to her.  CSW agreed to speak with patient's attending nurse at [facility] to voice patient's concerns about the food and potential weight loss.  Patient also reported that she plans to speak with her nurse, in hopes that she will have better meal choices, as well as hot food served.  Patient indicated that her CPAP Machine is broken so she has contacted a representative with [name] to order new equipment.  Patient reported that she  receives all her durable medical supplies through [name], agreeable to CSW also arranging home health services through [name], if ordered.  Patient endorses that she will return home to live independently at time of discharge from [facility].  Patient reports being able to afford her prescription medications and has her own vehicle to transport herself to and from her physician appointments.  Patient is already set up with [name] through [name], so CSW will contact a representative with [employer] [facility] to have patient's Horn placed on hold until patient returns home.  CSW will meet with patient for a routine visit at [facility] on Friday, November 27, 2017 at 11:00 AM to assess and assist with discharge planning needs and services.  Patient reported working well with therapies, both physical and occupational, and believes that she has made great progress.  Patient indicated that she has a plan of care meeting scheduled for today (Friday, November 20, 2017) at 1:00 PM to discuss her discharge needs, for which [name] plans to attend.  Patient reported that she only plans to be at [facility] for two weeks, time already approved through [employer].  [name], BSW, MSW, LCSW  Licensed Education officer, environmental Health System  Mailing [place] [address] [place], [place] [address] Physical Address-[address], [place], [place] [address] Toll Free Main # [phone] Fax # [phone] Cell # [phone]  Office # [phone] [name]

## 2017-11-23 ENCOUNTER — Encounter: Payer: PPO | Admitting: Internal Medicine

## 2017-11-23 ENCOUNTER — Telehealth: Payer: Self-pay

## 2017-11-23 NOTE — Telephone Encounter (Signed)
rtc to pt, she states all her diab meds have been changed, with that her cbgs are as high as 500 and she is having the appearance of spiders and little snakes come across her vision, states they do not touch her but float around, she called eye dr and they cannot see her until after wed 10/16. She states nothing else out of ordinary happening. She has advised the physician/ PA at facility this am of all this, the md or pa is changing her meds and pt will work with facility in getting opth eval this week

## 2017-11-23 NOTE — Telephone Encounter (Signed)
I am surprised her diabetes is now out of control.  The Victoza was stopped in the hospital for some reason and she needs to make sure they restarted it upon discharge.  Her admission regimen had achieved a Hgb A1C of 6.7 and should be the regimen she is currently on.  The prednisone should have ended on 11/19/2017, so I doubt this medication is still influencing the blood sugars unless they accidentally continued it.  These are the only suggestions I have at this point.  With regards to the eye findings, I am most concerned about changes in the lenses because of the hyperglycemia, and this should be corrected if placed back on her previous diabetic regimen.

## 2017-11-23 NOTE — Telephone Encounter (Signed)
Needs to speak with a nurse about blood sugar. Please call back.

## 2017-11-23 NOTE — Telephone Encounter (Signed)
Pt calls, she is presently residing at [facility], states she fell early this am "but only has a few bumps and scrapes" she does not get to the reason for her call til someone enters the room and she states she will need triage to call back in 15 to 20 mins. Call is ended

## 2017-11-24 NOTE — Telephone Encounter (Signed)
Thanks for the update.  Prednisone was likely driving much of this and with the wean to off and restart of all of her diabetic medications except for the Victoza we should be getting closer to addressing the lens water issue and improvement in her visual complaints.

## 2017-11-24 NOTE — Telephone Encounter (Signed)
Spoke to pt today: She states they are weaning her prednisone and should finish tomorrow-10/16 They have changed her insulin to levemir and novolog sliding scale, she states they could not provide her with victoza. Her CBG today: Fasting this am 208 Noon before lunch, nothing for 2 hrs prior 253 She has not been able to speak w/ the facility transportation scheduler and must give 5 days notice for appts, she has left messages for him/her. This would be for hosp f/u and optha. appts She states the snakes and spiders in her vision are much better today so she states maybe it was her high sugars Will call her Thursday for updates. Just a side note, she is worried about her sister, she will be having surgery tomorrow for removal of a mass.

## 2017-11-27 ENCOUNTER — Other Ambulatory Visit: Payer: Self-pay | Admitting: *Deleted

## 2017-11-27 NOTE — Patient Outreach (Addendum)
[facility]) Care Management  11/27/2017  [name] Aug 08, 1950 [id number]  CSW was able to meet with patient today at [facility] [place] where patient currently resides to receive short-term rehabilitative services.  Patient started off the conversation with stating, "Some people just don't like to admit when they are wrong".  Patient then went on to explain how the hospital and [facility] both made a mistake with regards to not administering her the correct diabetic medications and regimen.  Patient reported that she was weaning herself off of insulin (Victoza and Levemir) at home and that everything was going well.  Patient recalls having gone for several days without Victoza, Levemir and Novolog.  Patient reported that her blood sugars rose to 500 and she experienced a fall at the facility.  Fortunately, patient did not sustain injury; however, she reports having problems with her vision in both eyes, seeing floaters that look like spiders.  Patient was started back on the Levemir on 11/27/2017 and reports feeling much better.    Patient indicated that she is working well with therapies and believes she is making great progress.  Patient is not aware of a tentative discharge date scheduled for her at this time; therefore, CSW agreed to contact [name], Social Curator at [employer], to inquire.  Mrs. [name] was not available at the time of CSW's visit so CSW left a HIPAA compliant message for Mrs. [name] with the receptionist.  CSW is currently awaiting a return call.  Patient was pleased to inform CSW that her sister made it through surgery and is doing quite well.  Patient talked about her visit with her sister and niece last evening at the facility.  Patient endorses that she will return home to live alone at time of discharge.  Patient has been encouraged to see her ophthalmologist about  her vision problems, which she plans to do as soon as she is able to return home.  CSW agreed to meet with patient again on Friday, December 04, 2017 at 9:00 AM at [facility] to assess and assist with discharge planning needs and services.  [name], BSW, MSW, LCSW  Licensed Education officer, environmental Health System  Mailing [place] [address] [place], [place] [address] Physical Address-[address], [place], [place] [address] Toll Free Main # [phone] Fax # [phone] Cell # [phone]  Office # [phone] [email or web address]

## 2017-12-01 DIAGNOSIS — H43813 Vitreous degeneration, bilateral: Secondary | ICD-10-CM | POA: Diagnosis not present

## 2017-12-01 DIAGNOSIS — H05243 Constant exophthalmos, bilateral: Secondary | ICD-10-CM | POA: Diagnosis not present

## 2017-12-01 DIAGNOSIS — Z961 Presence of intraocular lens: Secondary | ICD-10-CM | POA: Diagnosis not present

## 2017-12-01 DIAGNOSIS — E119 Type 2 diabetes mellitus without complications: Secondary | ICD-10-CM | POA: Diagnosis not present

## 2017-12-01 DIAGNOSIS — H04123 Dry eye syndrome of bilateral lacrimal glands: Secondary | ICD-10-CM | POA: Diagnosis not present

## 2017-12-01 DIAGNOSIS — H53002 Unspecified amblyopia, left eye: Secondary | ICD-10-CM | POA: Diagnosis not present

## 2017-12-01 LAB — HM DIABETES EYE EXAM

## 2017-12-03 ENCOUNTER — Other Ambulatory Visit: Payer: Self-pay

## 2017-12-03 NOTE — Patient Outreach (Signed)
[facility]) Care Management  12/03/2017  [name] [birth year] [id number]    RN Health Coach closing the program notified that the patient is residing at [facility].  Lazaro Arms RN, BSN, [place] [facility] Dial:  [phone]  Fax: [phone]

## 2017-12-04 ENCOUNTER — Other Ambulatory Visit: Payer: Self-pay | Admitting: *Deleted

## 2017-12-04 ENCOUNTER — Encounter: Payer: Self-pay | Admitting: *Deleted

## 2017-12-04 NOTE — Patient Outreach (Signed)
[name]) Care Management  12/04/2017  [name] Jun 30, 1950 [id number]  CSW was able to meet with patient at [facility] today, [place] where patient currently resides to receive short-term rehabilitative services.  Patient was very excited to inform CSW that she is being discharged home tomorrow (Saturday, December 05, 2017).  Patient reported that she is definitely ready to return home and get back into the routine of things. Patient's sister, [name] will transport patient home at time of discharge.  Patient denied the need for home health services and/or durable medical equipment, reporting that she already has all the equipment she needs in the home and home health physical therapy and occupational therapy have not been recommended for her.  Patient is agreeable to having CSW arrange for an [facility] with [place] [facility] contact her for transition of care services.  Patient is aware that she will be receiving a call from the [facility] within the next 10 business days.  Patient admitted that she is able to afford to pay for her prescription medications, already receiving financial assistance through [employer].  Patient reported that Mrs. [name] has made sure that patient's refrigerator and pantry are stocked with food.  Patient was encouraged to contact CSW directly if she returns home and does not have everything she needs to be successful.  Patient voiced understanding and was agreeable to this plan.  CSW was able to ensure that patient has the correct contact information for CSW.  CSW will perform a case closure on patient, as all goals of treatment have been met from social work standpoint and no additional social work needs have been identified at this time.  CSW will notify patient's RNCM with [place] [facility], of CSW's plans to close patient's case.  CSW will fax an update  to patient's Primary Care Physician, Dr. Oval Horn to ensure that they are aware of CSW's involvement with patient's plan of care.    [name], BSW, MSW, LCSW  Licensed Education officer, [facility]  Mailing [place] [address] [place], [place] [address] Physical Address-[address], [place], [place] [address] Toll Free Main # [phone] Fax # [phone] Cell # [phone]  Office # [phone] [email or web address]

## 2017-12-07 ENCOUNTER — Other Ambulatory Visit: Payer: Self-pay

## 2017-12-07 ENCOUNTER — Encounter: Payer: Self-pay | Admitting: *Deleted

## 2017-12-07 NOTE — Patient Outreach (Signed)
[place] [facility]) Care Management  12/07/2017  NIKA YAZZIE Jun 13, 1950 [id number]   RNCM received referral on 12/04/17 from [place] work, [employer] Per referral: Client discharging from [facility] on 12/05/17.  RNCM called to engage. No answer. HIPPA compliant message left.  Plan: Follow up with [facility] social worker to confirm actual discharge date. continue to follow.  Thea Silversmith, RN, MSN, [place] Coordinator Cell: [phone]

## 2017-12-08 ENCOUNTER — Other Ambulatory Visit: Payer: Self-pay

## 2017-12-08 NOTE — Patient Outreach (Signed)
[place] [facility]) Care Management  12/08/2017  ENOLA SIEBERS 06-Nov-1950 [id number]  RNCM received referral on 12/04/17 from [place] work, [employer] Per referral: Client discharging from [facility] on 12/05/17.  RNCM called for transition of care. Client is very talkative and reports that she is improving. Ms. Boch states that the home health agency is going to call her tomorrow and someone will be out to see her on Thursday. She states she does not have her follow up appointment scheduled.  Medications reviewed. Client reports she does not have tessalon capsule because of cost. Client is taking over 10 medications. Pharmacy referral place.   Client reports her grandchildren were supposed to stay with her, but states they are not able to stay with her at this time. She expresses concern for her meals. She states she does have meals on wheels, but is concerned about getting additional meals during the day. Ms. Hodgens states she is thinking about assisted living, but does not think she will be able to afford. She is interested in knowing all her options for getting assistance. RNCM will ask social work to follow up.  Client without signs/symptoms of COPD exacerbation at this time. RNCM attempted to schedule home visit for tomorrow, but client states she will not be there tomorrow.  Plan: home visit within the next 1-2 weeks.  Thea Silversmith, RN, MSN, [place] Coordinator Cell: [phone]

## 2017-12-09 ENCOUNTER — Other Ambulatory Visit: Payer: Self-pay | Admitting: Licensed Clinical Social Worker

## 2017-12-09 ENCOUNTER — Encounter: Payer: Self-pay | Admitting: Pharmacist

## 2017-12-09 ENCOUNTER — Other Ambulatory Visit: Payer: Self-pay

## 2017-12-09 ENCOUNTER — Telehealth: Payer: Self-pay | Admitting: Pharmacist

## 2017-12-09 ENCOUNTER — Other Ambulatory Visit: Payer: Self-pay | Admitting: Pharmacist

## 2017-12-09 ENCOUNTER — Telehealth: Payer: Self-pay

## 2017-12-09 DIAGNOSIS — J441 Chronic obstructive pulmonary disease with (acute) exacerbation: Secondary | ICD-10-CM

## 2017-12-09 MED ORDER — BENZONATATE 100 MG PO CAPS: 100.0000 mg | ORAL_CAPSULE | Freq: Three times a day (TID) | ORAL | 0 refills | Status: DC | PRN

## 2017-12-09 NOTE — Patient Outreach (Addendum)
[place] [facility]) Care Management  12/09/2017  ALESHA JAFFEE [birth year] [id number]   67 year old female referred to [place] for 30 day post discharge medication reconciliation. Received discharge medication list from [facility]; of note, patient had been receiving Levemir 37 units BID while at the facility. Victoza was held during hospitalization and SMBG was managed with insulin; patient may need clarification regarding restarting Victoza.   Left HIPAA compliant message for patient to return my call at her convenience.   Catie Darnelle Maffucci, PharmD PGY2 Ambulatory Care Pharmacy Resident, [place] [facility] Phone: [phone]

## 2017-12-09 NOTE — Telephone Encounter (Signed)
Requesting to speak with [name]. Please call back.  

## 2017-12-09 NOTE — Progress Notes (Signed)
Patient requested transfer of benzonatate prescription to [facility] for lower cost. Prescription transferred.

## 2017-12-09 NOTE — Patient Outreach (Signed)
[place] [facility]) Care Management  12/09/2017  [name] 04/19/1950 [id number]   RNCM received a voice message from client request RNCM to call her back. No additional information provided. RNCM called client. No answer. HIPPA compliant message left.  Plan: await return call. Continue to follow.  Thea Silversmith, RN, MSN, [place] Coordinator Cell: [phone]

## 2017-12-09 NOTE — Patient Outreach (Signed)
[place] [facility]) Care Management  12/09/2017  [name] Dec 25, 1950 [id number]   RNCM received return call from client who states she was calling to confirm her Novolog insulin dosage which matches what is listed in chart.  Plan: continue to follow.  Thea Silversmith, RN, MSN, [place] Coordinator Cell: [phone]

## 2017-12-09 NOTE — Patient Outreach (Signed)
[place]) Care Management  12/09/2017  AMARI BURNSWORTH 21-Feb-1950 [id number]   [place] [facility]) Care Management [place]  12/09/2017  ANAIA FRITH 01/23/1951 [id number]  Reason for referral: medication assistance (Tessalon)  [facility] pharmacy case is being closed due to the following reasons:  The patient is being assisted by Flossie Dibble, PharmD in the internal medicine clinic.  Tessalon was called into [facility] which appears to be cheaper for patient (goodRX price ranges $9-15/#30).  Lavella Lemons is NF with HTA insurance.  Patient could also try OTC anti-tussives is [employer] is not affordable.   Patient has been provided [facility] CM contact information if assistance needed in the future.    Thank you for allowing [facility] pharmacy to be involved in this patient's care.    Regina Eck, PharmD, [place]  [phone]

## 2017-12-09 NOTE — Patient Outreach (Addendum)
[place] [facility]) Care Management  12/09/2017  [name] 08/22/1950 [id number]  [facility] CSW received new referral on 12/09/17 that stated: Client reports she has limited support at home. She states her grandchildren were going to stay, but for various reasons did not. She states she is thinking about going to assisted living, but is concerned about cost. She would like to know her options. She reports needing personal care service options also. [facility] CSW completed initial outreach call and was able to reach patient successfully. HIPPA verifications received. [facility] CSW introduced self, reason for call and of [facility] social work services. Patient reports that her income is over the amount required in order to be eligible for Adult Medicaid. Patient reports her income as $1227 per month. Patient was informed that this income would qualify her for Special Assistance Medicaid. Patient shares that she does not want to pursue ALF placement as she does not wish to turn over her entire SS check as well as share a room. Patient reports that this is something she would like to plan for in the future. [facility] CSW provided education on the entire ALF placement process as well as provided education on available ALF's that accept Medicaid. Patient reports that she wishes to stay at home for as long as possible but acknowledges her limited support network which makes this difficult. Patient reports that her sister comes by weekly to check on her and bring groceries. Patient shares that her main support is her sister at this time but she is unsure as to how long she will continue to provide assistance. [facility] CSW provided education on available personal care service resources, adult day programs and senior centers within [facility]. Patient reports receiving meals on wheels during the week but admits having difficulty preparing her other meals throughout the day. Patient does not wish to pursue PACE either as she does  not wish to change her PCP. Patient shares that she is unable to pay out of pocket for an aide at this time but will spend more time reaching out to her neighbors/family/friends to see if they would be able to come in the home and provide assistance once her Home Health services end. Patient denies wanting ALF placement at this time but was appreciative of social work education, support and assistance provided during phone call. Patient denies any further social work needs at this time. Patient agreeable to keep [facility] CSW's contact number in case she were to change her mind about placement. [facility] CSW will sign off at this time.  Eula Fried, BSW, MSW, [email or web address] Phone: [phone] Fax: [phone]

## 2017-12-09 NOTE — Telephone Encounter (Signed)
Called pt back, spoke to her for appr 47mn, talked about changing pharmacies, her returning home from rehab, cats that belong to family members that she has been caring for, [facility]. She has no new problems, "I just needed to talk" We did speak of an appt 11/8 , [facility] for 1045

## 2017-12-10 ENCOUNTER — Other Ambulatory Visit: Payer: Self-pay | Admitting: Internal Medicine

## 2017-12-10 ENCOUNTER — Other Ambulatory Visit (HOSPITAL_COMMUNITY): Payer: Self-pay | Admitting: Cardiology

## 2017-12-10 DIAGNOSIS — I5032 Chronic diastolic (congestive) heart failure: Secondary | ICD-10-CM

## 2017-12-10 DIAGNOSIS — M810 Age-related osteoporosis without current pathological fracture: Secondary | ICD-10-CM | POA: Diagnosis not present

## 2017-12-10 DIAGNOSIS — Z7901 Long term (current) use of anticoagulants: Secondary | ICD-10-CM | POA: Diagnosis not present

## 2017-12-10 DIAGNOSIS — M1990 Unspecified osteoarthritis, unspecified site: Secondary | ICD-10-CM | POA: Diagnosis not present

## 2017-12-10 DIAGNOSIS — Z794 Long term (current) use of insulin: Secondary | ICD-10-CM | POA: Diagnosis not present

## 2017-12-10 DIAGNOSIS — S81001D Unspecified open wound, right knee, subsequent encounter: Secondary | ICD-10-CM | POA: Diagnosis not present

## 2017-12-10 DIAGNOSIS — E1140 Type 2 diabetes mellitus with diabetic neuropathy, unspecified: Secondary | ICD-10-CM | POA: Diagnosis not present

## 2017-12-10 DIAGNOSIS — K219 Gastro-esophageal reflux disease without esophagitis: Secondary | ICD-10-CM | POA: Diagnosis not present

## 2017-12-10 DIAGNOSIS — J441 Chronic obstructive pulmonary disease with (acute) exacerbation: Secondary | ICD-10-CM | POA: Diagnosis not present

## 2017-12-10 DIAGNOSIS — Z7951 Long term (current) use of inhaled steroids: Secondary | ICD-10-CM | POA: Diagnosis not present

## 2017-12-10 DIAGNOSIS — G4733 Obstructive sleep apnea (adult) (pediatric): Secondary | ICD-10-CM | POA: Diagnosis not present

## 2017-12-10 DIAGNOSIS — I2722 Pulmonary hypertension due to left heart disease: Secondary | ICD-10-CM | POA: Diagnosis not present

## 2017-12-10 DIAGNOSIS — M797 Fibromyalgia: Secondary | ICD-10-CM

## 2017-12-10 DIAGNOSIS — I480 Paroxysmal atrial fibrillation: Secondary | ICD-10-CM | POA: Diagnosis not present

## 2017-12-10 DIAGNOSIS — F322 Major depressive disorder, single episode, severe without psychotic features: Secondary | ICD-10-CM | POA: Diagnosis not present

## 2017-12-10 DIAGNOSIS — F419 Anxiety disorder, unspecified: Secondary | ICD-10-CM

## 2017-12-10 DIAGNOSIS — Z9181 History of falling: Secondary | ICD-10-CM | POA: Diagnosis not present

## 2017-12-11 ENCOUNTER — Other Ambulatory Visit: Payer: Self-pay | Admitting: Pharmacist

## 2017-12-11 ENCOUNTER — Other Ambulatory Visit: Payer: Self-pay | Admitting: Internal Medicine

## 2017-12-11 DIAGNOSIS — E1140 Type 2 diabetes mellitus with diabetic neuropathy, unspecified: Secondary | ICD-10-CM

## 2017-12-11 DIAGNOSIS — Z794 Long term (current) use of insulin: Principal | ICD-10-CM

## 2017-12-11 NOTE — Patient Outreach (Signed)
[place] [facility]) Care Management [place]   12/11/2017  [name] 1950/09/06 [id number]   67 year old female referred to [place] for 30 day post discharge medication reconciliation. She was discharged from [facility] on 11/18/2017 to  [facility] SNF through 12/05/2017. PMH includes T2DM, PAF, hx mitral valve replacement, COPD.  Today, she notes that she is doing fairly well since discharge. Her breathing is improving, but she notes that her blood sugars have been more out of control since prednisone bursts and changing medication therapies. She notes that when she was admitted, she did not receive Victoza and was managed only with sliding scale Novolog, and orders for this regimen was sent to [facility]. She notes that the Novolog 15 unit "base" portion of her regimen was "left off" while at SNF, so her BG got severely uncontrolled to >500. At that point, they re-started Levemir and continued Novolog SSI. She was discharged from SNF on this regimen. She reports that SMBG are consistently >200, and that she "has never seen a 100 and never will". She would like to hold off restarting Victoza until she has recovered more from "being sick", but does plan to restart at some point.  She also notes that Flossie Dibble, PharmD, is helping get her Nicoderm patches. She does plan to pursue tobacco cessation, but does not have/is not using patches at this time.   Upon medication review, she describes that she doesn't think hydrocodone-acetaminophen is helping her pain much, but she continues to take at least twice daily. She reports being very regimented about taking hydrocodone - she always writes down when she takes a dose, and ensures that she does not take two doses within 6 hours of each other. Additionally, she notes that she typically only takes alprazolam at bedtime, but will occasionally take an extra 1/2 tablet during the day "on bad days".  She denies  any concerns with affording medications because she has [employer].  Objective: Lab Results  Component Value Date   CREATININE 1.00 11/18/2017   CREATININE 1.36 (H) 11/17/2017   CREATININE 1.13 (H) 11/16/2017  CrCl ~ 55 mL/min, eGFR 57 mL/min/1.52m  Lab Results  Component Value Date   HGBA1C 6.7 (A) 11/13/2017    Lipid Panel     Component Value Date/Time   CHOL 119 05/19/2017 1448   TRIG 254 (H) 05/19/2017 1448   HDL 32 (L) 05/19/2017 1448   CHOLHDL 3.7 05/19/2017 1448   VLDL 51 (H) 05/19/2017 1448   LDLCALC 36 05/19/2017 1448    BP Readings from Last 3 Encounters:  11/18/17 (!) 156/74  11/13/17 (!) 135/51  11/05/17 128/62    Allergies  Allergen Reactions  . Lorazepam Other (See Comments)    Patient's sister noted that ativan caused the patient to become extremely confused during hospitalization 09/2010; tolerates Xanax  . Morphine And Related Other (See Comments)    Injection site reaction  . Oxycontin [Oxycodone] Other (See Comments)    headache  . Tramadol Hcl Swelling    Ankle swelling    Medications Reviewed Today    [facility]ed [facility] on 12/11/17 at [place] Status: <None>  Medication Order Taking? Sig Documenting Provider Last Dose Status Informant  albuterol (PROAIR HFA) 108 (90 Base) MCG/ACT inhaler [id number] Inhale 2 puffs into the lungs every 6 (six) hours as needed for shortness of breath. KOval Linsey MD Taking Active Multiple Informants  ALPRAZolam ([name] 1  MG tablet [id number] Yes Take 0.5-1 tablets (0.5-1 mg total) by mouth 2 (two) times daily as needed for anxiety.  Patient taking differently:  Take 0.5-1 mg by mouth See admin instructions. Take 1 tablet (1 mg) by mouth daily at bedtime, may also take 1/2 tablet (0.5 mg) during the day as needed for anxiety   [name], MD Taking Active Multiple Informants           Med Note Darnelle Maffucci, Arville Lime   Fri Dec 11, 2017  9:26 AM) Taking 1 tab QHS for  sleep; occasionally 1/2 in daytime  benzonatate (TESSALON) 100 MG capsule [id number] Yes Take 1 capsule (100 mg total) by mouth 3 (three) times daily as needed for cough. Forde Dandy, PharmD Taking Active   Blood Glucose Monitoring Suppl [facility] VERIO) W/DEVICE KIT [id number] Yes 1 each by Does not apply route 4 (four) times daily. [name], MD Taking Active Multiple Informants  buPROPion [facility]) 75 MG tablet [id number] Yes Take 1 tablet (75 mg total) by mouth 2 (two) times daily. [name], MD Taking Active Multiple Informants  chlorpheniramine (CHLOR-TRIMETON) 4 MG tablet [id number] No Take 4 mg by mouth daily as needed for allergies. [provider] Not Taking Active Multiple Informants  clobetasol cream (TEMOVATE) 0.05 % [id number] No Apply 1 application topically 2 (two) times daily as needed (lichen sclerosus flare).  [provider] Not Taking Active Multiple Informants  DULoxetine (CYMBALTA) 60 MG capsule [id number] Yes Take 1 capsule (60 mg total) by mouth 2 (two) times daily. [name], MD Taking Active Multiple Informants  ferrous gluconate (FERGON) 324 MG tablet [id number] Yes Take 1 tablet (324 mg total) by mouth 2 (two) times daily with a meal.  Patient taking differently:  Take 648 mg by mouth See admin instructions. Take 2 tablets (648 mg) by mouth daily with lunch or supper   [name], MD Taking Active Multiple Informants  fluticasone (FLONASE) 50 MCG/ACT nasal spray [id number] Yes Place 2 sprays into both nostrils daily.  Patient taking differently:  Place 2 sprays into both nostrils at bedtime.    [name], MD Taking Active Multiple Informants  Fluticasone-Salmeterol (ADVAIR DISKUS) 500-50 MCG/DOSE AEPB [id number] Yes Inhale 1 puff into the lungs 2 (two) times daily. [name], MD Taking Active Multiple Informants  furosemide (LASIX) 80 MG tablet [id number] Yes Take 1 tablet (80 mg total) by mouth daily. [name], MD  Taking Active   gabapentin (NEURONTIN) 300 MG capsule [id number] Yes Take 2 capsules (600 mg total) by mouth 3 (three) times daily.  Patient taking differently:  Take 900 mg by mouth 2 (two) times daily.    [name], MD Taking Active Multiple Informants           Med Note Luretha Rued   Tue Dec 08, 2017  5:08 PM) Is taking 3 capsules twice/day.  glucose blood (ONETOUCH VERIO) test strip [id number] Yes 1 each by Other route QID. Use as instructed Lenore Cordia, MD Taking Active Multiple Informants  HYDROcodone-acetaminophen (NORCO/VICODIN) 5-325 MG tablet [id number] Yes Take 2 tablets by mouth every 6 (six) hours as needed for severe pain.  Patient taking differently:  Take 2 tablets by mouth 2 (two) times daily.    [name], MD Taking Active Multiple Informants  insulin aspart (NOVOLOG FLEXPEN) 100 UNIT/ML FlexPen [id number] Yes Inject 15 Units into the skin 4 (four) times daily -  before meals and at bedtime.  Patient taking differently:  Inject 15 Units into  the skin See admin instructions. Inject 15 units subcutaneously four times daily (before meals and at bedtime) plus an adjustment per sliding scale: CBG 150-200 add 5 units, 201-250 add 10 units, 251-300 add 15 units.   [name], MD Taking Active Multiple Informants           Med Note Darnelle Maffucci, Arville Lime   Fri Dec 11, 2017  9:44 AM)    Insulin Detemir (LEVEMIR) 100 UNIT/ML Pen [id number] Yes Inject 37 Units into the skin 2 (two) times daily. [provider] Taking Active   Insulin Pen Needle 32G X 4 MM MISC [id number] Yes Use to inject insulin up to 6 times a day [name], MD Taking Active Multiple Informants  Insulin Syringe-Needle U-100 31G X 15/64" 0.5 ML MISC [id number] Yes Use to inject insulin up to 4 times a day [name], MD Taking Active Multiple Informants  Lancets Misc. (ACCU-CHEK FASTCLIX LANCET) KIT [id number] Yes Check your blood 4 times a day dx code 250.00 insulin requiring [name], MD Taking Active Multiple Informants  metoprolol succinate (TOPROL-XL) 25 MG 24 hr tablet [id number] Yes Take 1 tablet (25 mg total) by mouth daily. [name], MD Taking Active Multiple Informants  nicotine (NICODERM CQ - DOSED IN MG/24 HOURS) 14 mg/24hr patch [id number] No Place 1 patch (14 mg total) onto the skin daily.  Patient not taking:  Reported on 12/08/2017   Dorrell, Andree Elk, MD Not Taking Active   nystatin cream (MYCOSTATIN) [id number] No Apply 1 application topically 2 (two) times daily as needed (rash).  [provider] Not Taking Active Multiple Informants  omeprazole (PRILOSEC) 40 MG capsule [id number] Yes Take 1 capsule (40 mg total) by mouth 2 (two) times daily. [name], MD Taking Active Multiple Informants  OXYGEN [id number]  Inhale 2 L into the lungs as needed (for shortness of breath while active).  [provider]  Active Multiple Informants  potassium chloride SA (K-DUR,KLOR-CON) 20 MEQ tablet [id number] Yes Take 1 tablet (20 mEq total) by mouth 2 (two) times daily. [name], MD Taking Active Multiple Informants       Patient not taking:       Discontinued 12/11/17 0949 (Completed Course)   PRESCRIPTION MEDICATION [id number]  Inhale into the lungs at bedtime. CPAP [provider]  Active Multiple Informants  rosuvastatin (CRESTOR) 20 MG tablet [id number] Yes Take 1 tablet (20 mg total) by mouth at bedtime. [name], MD Taking Active Multiple Informants  tiotropium [facility] HANDIHALER) 18 MCG inhalation capsule [id number] Yes Place 1 capsule (18 mcg total) into inhaler and inhale daily. [name], MD Taking Active Multiple Informants  warfarin (COUMADIN) 2.5 MG tablet [id number] Yes TAKE 1 TABLET BY MOUTH DAILY AT 6 PM EXCEPT ON MONDAYS  Patient taking differently:  Take 2.5 mg by mouth See admin instructions. Take 1 tablet (2.5 mg) by mouth daily at bedtime except on Mondays (do not take any warfarin on Monday  nights)   [name], MD Taking Active Multiple Informants          Assessment: Date Discharged from Hospital: 11/18/2017 (SNF discharge 12/05/2017) Date Medication Reconciliation Performed: 12/11/2017  Medications Discontinued at Discharge:   Victoza  New Medications at Discharge:  Benzonatate  Nicoderm CQ  Prednisone (completed course)  Levemir ([facility] at [facility])  Patient was recently discharged from hospital and all medications have been reviewed  Drugs sorted by system:  Neurologic/Psychologic: alprazolam, bupropion, duloxetine  Cardiovascular: furosemide, metoprolol, rosuvastatin, warfarin  Pulmonary/Allergy: albuterol, benzonatate,  fluticasone nasal, Advair, Spiriva   Gastrointestinal: omeprazole  Endocrine: Levemir, Novolog  Topical: clobetasol, nystatin  Pain: gabapentin, hydrocodone-acetaminophen  Vitamins/Minerals/Supplements: ferrous gluconate, potassium   Medication Review Findings:  . Levemir restarted at SNF, confirmed that patient was receiving 37 units BID as well as Novolog only utilizing a sliding scale, no base dose. Since discharge, patient has continued Levemir 37 units BID + Novolog 15 units TID + sliding scale. Encouraged patient to bring SMBG results to PCP f/u for insulin adjustments; consideration of restarting Victoza . Counseled patient on potential concerns with combining opioid and benzodiazpine treatments and increased risk of overdose. Could consider prescribing Narcan for patient to have at home . Gabapentin dosing: Recommended to limit to 700 mg BID w/ CrCl <60 mL/min. Current dosing of 900 mg BID may cause increased CNS sedation, particularly in combination with other sedating agents. Continue to monitor for this, and reevaluate pain regimen as needed. Per chart review, patient did not tolerate Lyrica previously.   . BP fluctuant between controlled and uncontrolled during recent visits. Consider checking urine microalbumin; if is  has increased to >30 mg/G and BP not optimally controlled, consider starting ACEi/ARB   Plan: - Will route note to Flossie Dibble, PharmD regarding Nicoderm CQ - Will route note to PCP for FYI - Patient has my contact information, encouraged to reach out with any future questions or concerns.   Catie Darnelle Maffucci, PharmD PGY2 Ambulatory Care Pharmacy Resident, [place] [facility] Phone: [phone]

## 2017-12-14 ENCOUNTER — Telehealth: Payer: Self-pay

## 2017-12-14 NOTE — Telephone Encounter (Signed)
[facility] is requesting a VO for PT for strength training and improve/assist with ambulation.  Will route to PCP for approval / denial.   Thank you, SChaplin, RN

## 2017-12-14 NOTE — Telephone Encounter (Signed)
Please contact [facility] to give them a verbal order for PT for strength training and improve/assist with ambulation.  Thank You.

## 2017-12-14 NOTE — Telephone Encounter (Signed)
[facility] with [facility] requesting VO for PT. Please call back.

## 2017-12-15 ENCOUNTER — Other Ambulatory Visit: Payer: Self-pay

## 2017-12-15 NOTE — Patient Outreach (Signed)
[facility]) Care Management  [place]  12/15/2017   SOLACE MANWARREN 05/11/1950 [id number]  Subjective: client reports she has been out with her sister.  Objective: none  Encounter Medications:  Outpatient Encounter Medications as of 12/15/2017  Medication Sig Note  . albuterol (PROAIR HFA) 108 (90 Base) MCG/ACT inhaler Inhale 2 puffs into the lungs every 6 (six) hours as needed for shortness of breath.   . ALPRAZolam (XANAX) 1 MG tablet Take 1 tablet (1 mg) by mouth at bedtime as needed for insomnia.  May take 1/2 tablet (0.5 mg) by mouth during the day for anxiety.   . benzonatate (TESSALON) 100 MG capsule Take 1 capsule (100 mg total) by mouth 3 (three) times daily as needed for cough.   . Blood Glucose Monitoring Suppl (ONETOUCH VERIO) W/DEVICE KIT 1 each by Does not apply route 4 (four) times daily.   Marland Kitchen buPROPion (WELLBUTRIN) 75 MG tablet Take 1 tablet (75 mg total) by mouth 2 (two) times daily.   . chlorpheniramine (CHLOR-TRIMETON) 4 MG tablet Take 4 mg by mouth daily as needed for allergies.   . clobetasol cream (TEMOVATE) 3.35 % Apply 1 application topically 2 (two) times daily as needed (lichen sclerosus flare).    . DULoxetine (CYMBALTA) 60 MG capsule Take 1 capsule (60 mg total) by mouth 2 (two) times daily.   . ferrous gluconate (FERGON) 324 MG tablet Take 1 tablet (324 mg total) by mouth 2 (two) times daily with a meal. (Patient taking differently: Take 648 mg by mouth See admin instructions. Take 2 tablets (648 mg) by mouth daily with lunch or supper)   . fluticasone (FLONASE) 50 MCG/ACT nasal spray Place 2 sprays into both nostrils daily. (Patient taking differently: Place 2 sprays into both nostrils at bedtime. )   . Fluticasone-Salmeterol (ADVAIR DISKUS) 500-50 MCG/DOSE AEPB Inhale 1 puff into the lungs 2 (two) times daily.   . furosemide (LASIX) 80 MG tablet Take 1 tablet (80 mg total) by mouth daily.   Marland Kitchen gabapentin (NEURONTIN) 300 MG capsule  Take 2 capsules (600 mg total) by mouth 3 (three) times daily. (Patient taking differently: Take 900 mg by mouth 2 (two) times daily. ) 12/08/2017: Is taking 3 capsules twice/day.  Marland Kitchen glucose blood (ONETOUCH VERIO) test strip Use to check blood sugar 4 times daily. diag code E11.40. Insulin dependent   . HYDROcodone-acetaminophen (NORCO/VICODIN) 5-325 MG tablet Take 2 tablets by mouth every 6 (six) hours as needed for severe pain. (Patient taking differently: Take 2 tablets by mouth 2 (two) times daily. )   . insulin aspart (NOVOLOG FLEXPEN) 100 UNIT/ML FlexPen Inject 15 Units into the skin 4 (four) times daily -  before meals and at bedtime. (Patient taking differently: Inject 15 Units into the skin See admin instructions. Inject 15 units subcutaneously four times daily (before meals and at bedtime) plus an adjustment per sliding scale: CBG 150-200 add 5 units, 201-250 add 10 units, 251-300 add 15 units.)   . Insulin Detemir (LEVEMIR) 100 UNIT/ML Pen Inject 37 Units into the skin 2 (two) times daily.   . Insulin Pen Needle 32G X 4 MM MISC Use to inject insulin up to 6 times a day   . Insulin Syringe-Needle U-100 31G X 15/64" 0.5 ML MISC Use to inject insulin up to 4 times a day   . Lancets Misc. (ACCU-CHEK FASTCLIX LANCET) KIT Check your blood 4 times a day dx code 250.00 insulin requiring   . metoprolol succinate (TOPROL-XL)  25 MG 24 hr tablet Take 1 tablet (25 mg total) by mouth daily.   . nicotine (NICODERM CQ - DOSED IN MG/24 HOURS) 14 mg/24hr patch Place 1 patch (14 mg total) onto the skin daily. (Patient not taking: Reported on 12/08/2017)   . nystatin cream (MYCOSTATIN) Apply 1 application topically 2 (two) times daily as needed (rash).    Marland Kitchen omeprazole (PRILOSEC) 40 MG capsule Take 1 capsule (40 mg total) by mouth 2 (two) times daily.   . OXYGEN Inhale 2 L into the lungs as needed (for shortness of breath while active).    . potassium chloride SA (K-DUR,KLOR-CON) 20 MEQ tablet Take 1 tablet (20  mEq total) by mouth 2 (two) times daily.   Marland Kitchen PRESCRIPTION MEDICATION Inhale into the lungs at bedtime. CPAP   . rosuvastatin (CRESTOR) 20 MG tablet Take 1 tablet (20 mg total) by mouth at bedtime.   Marland Kitchen tiotropium (SPIRIVA HANDIHALER) 18 MCG inhalation capsule Place 1 capsule (18 mcg total) into inhaler and inhale daily.   Marland Kitchen warfarin (COUMADIN) 2.5 MG tablet TAKE 1 TABLET BY MOUTH DAILY AT 6 PM EXCEPT ON MONDAYS (Patient taking differently: Take 2.5 mg by mouth See admin instructions. Take 1 tablet (2.5 mg) by mouth daily at bedtime except on Mondays (do not take any warfarin on Monday nights))    No facility-administered encounter medications on file as of 12/15/2017.     Functional Status:  In your present state of health, do you have any difficulty performing the following activities: 12/15/2017 11/20/2017  Hearing? N N  Vision? N N  Difficulty concentrating or making decisions? N Y  Walking or climbing stairs? Y Y  Comment - -  Dressing or bathing? N Y  Doing errands, shopping? [place] and eating ? - Y  Using the Toilet? - N  In the past six months, have you accidently leaked urine? - Y  Do you have problems with loss of bowel control? - N  Managing your Medications? - N  Managing your Finances? - N  Housekeeping or managing your Housekeeping? - Y  Some recent data might be hidden    Fall/Depression Screening: Fall Risk  12/08/2017 11/20/2017 11/13/2017  Falls in the past year? Yes Yes Yes  Number falls in past yr: 2 or more 2 or more 2 or more  Comment - - -  Injury with Fall? Yes Yes Yes  Risk Factor Category  High Fall Risk High Fall Risk High Fall Risk  Risk for fall due to : History of fall(s) History of fall(s);Impaired balance/gait;Impaired mobility History of fall(s);Impaired balance/gait;Impaired mobility;Other (Comment)  Risk for fall due to: Comment - - home o2  Follow up Falls prevention discussed Education provided;Falls prevention discussed  Education provided;Falls prevention discussed  Comment - - -   PHQ 2/9 Scores 12/15/2017 11/20/2017 11/13/2017 11/05/2017 10/15/2017 10/07/2017 07/03/2017  PHQ - 2 Score _0 PHQ- 9 Score - - _1 Assessment: RNCM received referral on 12/04/17 from [place] work, [employer] Per referral: Client discharged from [facility] on 12/05/17.  RNCM return call to client. Assessment completed telephonically. Client reports she was out with her sister.  She reports she continues to have home health services: nursing, physical therapy with [facility]   History of fall with injury to her knee. Client reports fall prior to leaving the skilled facility resulting in skin abrasion to  knee.  She reports she has a Follow up appointment with primary care scheduled for Friday.  Plan: Follow up call next week.  THN CM Care Plan Problem One     Most Recent Value  Care Plan Problem One  at risk for readmission within the next 31 days.  Role Documenting the Problem One  Care Management [place] for Problem One  Active  [facility] Long Term Goal   client will not be readmitted within the next 31 days.  THN Long Term Goal Start Date  12/08/17  Interventions for Problem One Long Term Goal  RNCM discussed client's overal sense of well being, reviewed follow up appointment dates.  THN CM Short Term Goal #1   client will verbalize follow up appontment scheduled within the next 30 days.  THN CM Short Term Goal #1 Start Date  12/08/17  Interventions for Short Term Goal #1  discussed the importance of following up with provider visits.  THN CM Short Term Goal #2   client will verbalize contact with [facility] social work and pharmacy within the next 30 days.  THN CM Short Term Goal #2 Start Date  12/08/17  [facility] CM Short Term Goal #2 Met Date  12/15/17    [facility] CM Care Plan Problem Two     Most Recent Value  Care Plan Problem Two  Fall risk as evidence by client reporting that she fell prior  to leaving skilled facility.  Role Documenting the Problem Two  Care Management [place] for Problem Two  Active  Interventions for Problem Two Long Term Goal   reviewed equipment cliet currently has, reinforced the importance of using assistive devices per home health PT recommendations.  [place] Term Goal  client will verbalize at least three fall prevention strategies within the next 31 days.  THN Long Term Goal Start Date  12/15/17  [facility] CM Short Term Goal #1   client will verbalize receipt of EMMI material withn the next 30 days.  THN CM Short Term Goal #1 Start Date  12/15/17  Interventions for Short Term Goal #2   assign and send EMMI education material.     Thea Silversmith, RN, MSN, [place] Coordinator Cell: [phone]

## 2017-12-15 NOTE — Patient Outreach (Signed)
[facility] [facility]) Care Management  12/15/2017  [name] 1950/03/15 [id number]  RNCM received referral on 12/04/17 from [place] work, [employer] Per referral: Client discharging from [facility] on 12/05/17.  Home visit scheduled. Client was not home when client arrive. RNCM called prior to home visit to confirm home visit and client was not home upon arrival.  Plan: await return call. RNCM will follow up call within the next 3-4 business days if no return call.  Thea Silversmith, RN, MSN, [place] Coordinator Cell: [phone]

## 2017-12-18 ENCOUNTER — Encounter: Payer: Self-pay | Admitting: Internal Medicine

## 2017-12-18 ENCOUNTER — Ambulatory Visit (INDEPENDENT_AMBULATORY_CARE_PROVIDER_SITE_OTHER): Payer: PPO | Admitting: Internal Medicine

## 2017-12-18 VITALS — BP 153/53 | HR 69 | Temp 98.0°F | Wt 199.3 lb

## 2017-12-18 DIAGNOSIS — G8929 Other chronic pain: Secondary | ICD-10-CM | POA: Diagnosis not present

## 2017-12-18 DIAGNOSIS — J439 Emphysema, unspecified: Secondary | ICD-10-CM | POA: Diagnosis not present

## 2017-12-18 DIAGNOSIS — M545 Low back pain: Secondary | ICD-10-CM | POA: Diagnosis not present

## 2017-12-18 DIAGNOSIS — I10 Essential (primary) hypertension: Secondary | ICD-10-CM

## 2017-12-18 DIAGNOSIS — Z79899 Other long term (current) drug therapy: Secondary | ICD-10-CM

## 2017-12-18 DIAGNOSIS — F17200 Nicotine dependence, unspecified, uncomplicated: Secondary | ICD-10-CM

## 2017-12-18 DIAGNOSIS — Z9981 Dependence on supplemental oxygen: Secondary | ICD-10-CM | POA: Diagnosis not present

## 2017-12-18 DIAGNOSIS — F321 Major depressive disorder, single episode, moderate: Secondary | ICD-10-CM

## 2017-12-18 DIAGNOSIS — Z7951 Long term (current) use of inhaled steroids: Secondary | ICD-10-CM

## 2017-12-18 DIAGNOSIS — E1140 Type 2 diabetes mellitus with diabetic neuropathy, unspecified: Secondary | ICD-10-CM | POA: Diagnosis not present

## 2017-12-18 DIAGNOSIS — Z7901 Long term (current) use of anticoagulants: Secondary | ICD-10-CM

## 2017-12-18 DIAGNOSIS — R05 Cough: Secondary | ICD-10-CM | POA: Diagnosis not present

## 2017-12-18 DIAGNOSIS — R21 Rash and other nonspecific skin eruption: Secondary | ICD-10-CM | POA: Diagnosis not present

## 2017-12-18 DIAGNOSIS — I480 Paroxysmal atrial fibrillation: Secondary | ICD-10-CM | POA: Diagnosis not present

## 2017-12-18 DIAGNOSIS — R110 Nausea: Secondary | ICD-10-CM

## 2017-12-18 DIAGNOSIS — Z79891 Long term (current) use of opiate analgesic: Secondary | ICD-10-CM

## 2017-12-18 DIAGNOSIS — F331 Major depressive disorder, recurrent, moderate: Secondary | ICD-10-CM

## 2017-12-18 DIAGNOSIS — M5441 Lumbago with sciatica, right side: Secondary | ICD-10-CM

## 2017-12-18 DIAGNOSIS — Z794 Long term (current) use of insulin: Secondary | ICD-10-CM | POA: Diagnosis not present

## 2017-12-18 MED ORDER — METOPROLOL SUCCINATE ER 50 MG PO TB24: 50.0000 mg | ORAL_TABLET | Freq: Every day | ORAL | 3 refills | Status: DC

## 2017-12-18 MED ORDER — ONDANSETRON 4 MG PO TBDP: 4.0000 mg | ORAL_TABLET | Freq: Three times a day (TID) | ORAL | 0 refills | Status: DC | PRN

## 2017-12-18 MED ORDER — LIRAGLUTIDE 18 MG/3ML ~~LOC~~ SOPN: 0.6000 mg | PEN_INJECTOR | Freq: Every evening | SUBCUTANEOUS | 3 refills | Status: DC

## 2017-12-18 NOTE — Assessment & Plan Note (Signed)
Assessment  She was discharged from the hospital recently after therapy for a chronic obstructive pulmonary disease exacerbation.  She was transitioned first to a nursing home before returning home.  Her dyspnea has improved and is now at baseline.  She successfully completed the steroid course.  Plan  She remains in the pre-contemplative stage with regards to tobacco cessation although we have moved to the point where she is actually now talking about it.  We will reassess her readiness for an attempt at tobacco cessation at the next visit.  She also continues to use her oxygen 2 L/min at night and when leaving the house.  We will continue the inhaler regimen which includes albuterol MDI 2 puffs every 6 hours as needed, Advair Diskus 500-50 mcg 1 puff twice daily (she had exacerbated on the 250-50 mcg dose recently), and tiotropium 1 inhalation daily.  We will reassess the efficacy of this regimen in managing her symptoms of dyspnea secondary to her pulmonary emphysema at the follow-up visit.

## 2017-12-18 NOTE — Assessment & Plan Note (Signed)
Assessment  Her sugars in the nursing home and at home have been running high recently.  Apparently there was some confusion as to what her regimen should be upon discharge from the hospital.  In addition, she was on a steroid burst for her COPD exacerbation that was extended.  Upon transfer to the nursing home her Victoza and Levemir were apparently discontinued.  The Levemir has been restarted at 37 units twice daily but she prefers the Victoza as her diabetes was much better controlled on this medication.  She is also been taking prandial NovoLog.  Her neuropathy is symptomatically controlled on the gabapentin.  Plan  Her Levemir was decreased to 37 units in the morning and she was started on Victoza 0.6 mg in the evening.  She had previously made the conversion from the Levemir to the [place] in the past and has the instructions at home.  She was encouraged to follow those instructions again with the goal of returning to Victoza 1.8 mg daily and being off of the Levemir.  We will reassess the efficacy of this new regimen, that was previously effective, at the follow-up visit in 1 month.  We will continue the gabapentin for the neuropathy.  A urine for microalbumin was obtained, otherwise she is up-to-date on her diabetic healthcare maintenance.

## 2017-12-18 NOTE — Progress Notes (Signed)
   Subjective:    Patient ID: [name], female    DOB: Jul 16, 1950, 67 y.o.   MRN: [id number]  HPI  [name] is here for follow-up of her diabetes, chronic obstructive pulmonary disease, paroxysmal atrial fibrillation, and chronic low back pain. Please see the A&P for the status of the pt's chronic medical problems.  She is without acute complaints, but is also requesting zofran be available for the mild occasional nausea she experiences.  Review of Systems  Constitutional: Negative for activity change and appetite change.  Respiratory: Positive for cough. Negative for chest tightness and shortness of breath.   Cardiovascular: Positive for leg swelling. Negative for chest pain and palpitations.       Occasional mild transient lower extremity edema.  Gastrointestinal: Positive for nausea. Negative for vomiting.  Musculoskeletal: Positive for arthralgias and back pain. Negative for joint swelling and myalgias.  Skin: Positive for rash and wound.  Neurological: Negative for syncope.  Psychiatric/Behavioral: Positive for dysphoric mood and sleep disturbance.      Objective:   Physical Exam  Constitutional: She is oriented to person, place, and time. She appears well-developed and well-nourished. No distress.  HENT:  Head: Normocephalic and atraumatic.  Eyes: Conjunctivae are normal. Right eye exhibits no discharge. Left eye exhibits no discharge. No scleral icterus.  Musculoskeletal: Normal range of motion. She exhibits no edema, tenderness or deformity.  Neurological: She is alert and oriented to person, place, and time. She exhibits normal muscle tone.  Skin: Skin is warm and dry. No rash noted. She is not diaphoretic. There is erythema.  Asteatotic eczema with distal lower leg erythema bilaterally.  Psychiatric: She has a normal mood and affect. Her behavior is normal. Judgment and thought content normal.  Nursing note and vitals reviewed.     Assessment & Plan:    Please see problem based charting.

## 2017-12-18 NOTE — Patient Instructions (Addendum)
It was great to see you again.  I am sorry you have been having such a hard time.  1) Decrease the levemir to 37 units each morning and titrate down as you go up on the Victoza.  2) Start Victoza 0.6 mg every morning.  3) I wrote for zofran 4 mg every 8 hours as needed for nausea.  4) Increase the Toprol XL 50 mg by mouth daily.  I will see you back in 1 month, sooner if necessary.

## 2017-12-18 NOTE — Assessment & Plan Note (Signed)
Assessment  Her chronic low back pain has shifted towards the right.  It remains above the cleft of the buttocks and she will have occasional radiating pain down the right leg.  The pain is not in the buttocks and does not radiate laterally to the left since moving to the right side.  The pain is worse with standing and ambulation and improved with sitting, particularly in her recliner at home.  Plan  She was encouraged to be as active as possible.  She plans on getting a walker to help with ambulation at home.  We are continuing the hydrocodone-acetaminophen 5-325 mg 2 tablets every 6 hours as needed for back pain dispense number 100/month.  We will reassess the efficacy of these interventions at managing her chronic back pain at the follow-up visit.

## 2017-12-18 NOTE — Assessment & Plan Note (Signed)
Assessment  Her PHQ 9 today was 14.  This is on duloxetine 60 mg by mouth twice daily and Wellbutrin 75 mg by mouth twice daily.  She was not interested in making any adjustments in her pharmacologic regimen at this time.  This is fortunate as we have tried virtually every antidepressant with only moderate success.  She was referred to mental health services but has decided against utilizing them given the cost of the co-pays.  She therefore uses the primary care clinic as her therapist.  Plan  We will continue the duloxetine 60 mg by mouth twice daily and Wellbutrin 75 mg by mouth twice daily.  We spent over 30 minutes today discussing her life situation which has led to her depression.  In a way, this was talk therapy for her.  We will reassess the efficacy of this regimen and provide additional in person support at the follow-up visit.

## 2017-12-18 NOTE — Assessment & Plan Note (Signed)
Assessment  Her blood pressure today was elevated at 153/53.  This is on Toprol-XL 25 mg by mouth daily.  Plan  We will increase the Toprol-XL to 50 mg by mouth daily.  If her blood pressure remains elevated or her pulse limits further escalation of the Toprol-XL dose we will reinitiate lisinopril given her concomitant diabetes.  Her blood pressure will be reevaluated on the higher dose of Toprol-XL at the follow-up visit.

## 2017-12-18 NOTE — Assessment & Plan Note (Signed)
A urine for microalbumin was obtained today and is pending at the time of this dictation.  She is otherwise up-to-date on her healthcare maintenance.

## 2017-12-18 NOTE — Assessment & Plan Note (Signed)
Assessment  She has not had any palpitations on the Toprol-XL 25 mg by mouth daily.  Her pulse was in the upper 60s and lower 70s on this dose.  Plan  We will increase the Toprol-XL to 50 mg by mouth daily given her hypertension.  We are also continuing with the low-dose anticoagulation given that she has GI bleeding with the full dose.  We will reassess the efficacy of this therapy and managing palpitations should she flip back into her atrial fibrillation at the follow-up visit.

## 2017-12-19 LAB — MICROALBUMIN / CREATININE URINE RATIO
Creatinine, Urine: 96 mg/dL
Microalb/Creat Ratio: 20.1 mg/g creat (ref 0.0–30.0)
Microalbumin, Urine: 19.3 ug/mL

## 2017-12-21 ENCOUNTER — Telehealth: Payer: Self-pay

## 2017-12-21 DIAGNOSIS — I509 Heart failure, unspecified: Secondary | ICD-10-CM | POA: Diagnosis not present

## 2017-12-21 DIAGNOSIS — G4733 Obstructive sleep apnea (adult) (pediatric): Secondary | ICD-10-CM | POA: Diagnosis not present

## 2017-12-21 DIAGNOSIS — J439 Emphysema, unspecified: Secondary | ICD-10-CM | POA: Diagnosis not present

## 2017-12-21 DIAGNOSIS — J449 Chronic obstructive pulmonary disease, unspecified: Secondary | ICD-10-CM | POA: Diagnosis not present

## 2017-12-21 NOTE — Telephone Encounter (Signed)
Please call [name] and inform her she is correct in taking the Victoza in the evening.  I am sorry for the confusion.

## 2017-12-21 NOTE — Telephone Encounter (Signed)
This has been addressed in separate phone encounter from today. Hubbard Hartshorn, RN, BSN

## 2017-12-21 NOTE — Telephone Encounter (Signed)
Returned call to patient, no answer.  Left message to call RN back.  [place]

## 2017-12-21 NOTE — Telephone Encounter (Signed)
Received phone call from patient, she states she was looking at her after visit summary from her office visit on 12/18/17 and noted in the instructions it states to start Victoza 0.68m in the a.m. However, she was under the impression she was to start Victoza 0.645mQ evening, which is how it is listed under her medication profile.  Pt states she started the Victoza last night, but wants to clarify the instructions.  Pt also asking for a return call from Dr. KiMaudie Mercuryomorrow (Dr. KiMaudie Mercuryut today).  Will route to PCP and Dr. KiMaudie Mercury  SChaplin, RN,BSN

## 2017-12-21 NOTE — Telephone Encounter (Signed)
Requesting to speak with a nurse. Please call pt back.  

## 2017-12-22 ENCOUNTER — Telehealth: Payer: Self-pay

## 2017-12-22 NOTE — Progress Notes (Signed)
Patient ID: [name], female   DOB: 12-Feb-1950, 67 y.o.   MRN: [id number]  Urine creatinine 96 Microalbumin 19.3 Microalbumin/Creatinine 20.1  These values are improved with recent improvement in glycemic control.  If necessary we can initiate ACEI therapy at follow-up to provide further kidney protection from her diabetes.

## 2017-12-22 NOTE — Telephone Encounter (Signed)
Spoke with patient about Victoza titration, COPD maintence inhalers, and nicotine replacement therapy. Patient has a good understanding of Victoza titration and is not expereincing any side effects. She reports taking Spiriva 18 mcg once daily and Advair 500-50 mcg one puff twice daily. She uses her albuterol at most once daily, and sometimes she will go weeks without using. She also requests nicotine patches at her next visit on 12/06. She wants at least 1 box of each strength if possible (40m, 14 mg, 7 mg).

## 2017-12-22 NOTE — Telephone Encounter (Signed)
Patient made aware that correct time to take Victoza is in the evening. She was appreciative. Hubbard Hartshorn, RN, BSN

## 2017-12-23 ENCOUNTER — Other Ambulatory Visit: Payer: Self-pay

## 2017-12-23 NOTE — Patient Outreach (Signed)
[place] [facility]) Care Management  12/23/2017  [name] 17-Sep-1950 [id number]   RNCM received referral on 12/04/17 from [place] work, [employer] Per referral: Client discharged from [facility] on 12/05/17.  67 year old with history of (not all inclusive): COPD, atrial fibrillation, hyperlipidemia, HTN,GERD, DM, OSA, pulmonary hypertension, pulmonary emphysema, fibromyalgia, obesity.  RNCM called to follow up. No answer. HIPPA compliant message left.  RNCM will await return call, follow up in 3-4 business days if no return call.  Thea Silversmith, RN, MSN, [place] Coordinator Cell: [phone]

## 2017-12-28 ENCOUNTER — Other Ambulatory Visit: Payer: Self-pay

## 2017-12-28 NOTE — Patient Outreach (Signed)
[facility] [facility]) Care Management  12/28/2017  [name] 15-Jul-1950 [id number]   RNCM received referral on 12/04/17 from [place] work, [employer] Per referral: Client discharged from [facility] on 12/05/17.  67 year old with history of (not all inclusive): COPD, atrial fibrillation, hyperlipidemia, HTN,GERD, DM, OSA, pulmonary hypertension, pulmonary emphysema, fibromyalgia, obesity.   RNCM called and reports that home health physical therapy has been there to see her. She states that she had a work out from him and had some increased pain, but states her pain level is down and tolerable. She states that her medications help and rest also helps relieve the pain.   She denies any shortness of breath at this time. She states last week she had to use the rescue inhaler, but has not had to do so since. She denies any issues or problems at this time.  Plan: continue follow up; education/reinforcement of COPD management; fall prevention.  Thea Silversmith, RN, MSN, [facility] Community Care Coordinator Cell: [phone]  [facility] CM Care Plan Problem One     Most Recent Value  Care Plan Problem One  at risk for readmission within the next 31 days.  Role Documenting the Problem One  Care Management [place] for Problem One  Active  [facility] Long Term Goal   client will not be readmitted within the next 31 days.  THN Long Term Goal Start Date  12/08/17  Interventions for Problem One Long Term Goal  discussed client's overall sense of well-being, encouraged client to call RNCM as needed.  THN CM Short Term Goal #1   client will verbalize follow up appontment scheduled within the next 30 days.  THN CM Short Term Goal #1 Start Date  12/08/17  Interventions for Short Term Goal #1  discussed with client her upcoming appointments.  THN CM Short Term Goal #2   client will verbalize contact with [facility] social work and pharmacy within the next 30 days.  THN CM Short  Term Goal #2 Start Date  12/08/17  [facility] CM Short Term Goal #2 Met Date  12/15/17    [facility] CM Care Plan Problem Two     Most Recent Value  Care Plan Problem Two  Fall risk as evidence by client reporting that she fell prior to leaving skilled facility.  Role Documenting the Problem Two  Care Management [place] for Problem Two  Active  Interventions for Problem Two Long Term Goal   client encouraged to continue to work with physical therapist and take rest periods as needed.  [place] Term Goal  client will verbalize at least three fall prevention strategies within the next 31 days.  THN Long Term Goal Start Date  12/15/17  [facility] CM Short Term Goal #1   client will verbalize receipt of EMMI material withn the next 30 days.  THN CM Short Term Goal #1 Start Date  12/15/17  Interventions for Short Term Goal #2   RNCM will take material to client at next home visit.

## 2017-12-29 DIAGNOSIS — I509 Heart failure, unspecified: Secondary | ICD-10-CM | POA: Diagnosis not present

## 2017-12-29 DIAGNOSIS — J449 Chronic obstructive pulmonary disease, unspecified: Secondary | ICD-10-CM | POA: Diagnosis not present

## 2017-12-29 IMAGING — CT CT ABDOMEN WO/W CM
2 of 10 series · 9 of 46 positions shown, 15 images · IV contrast (ISOVUE)
Comparison: CT the abdomen 09/05/2014, and multiple other prior
examinations.

CLINICAL DATA: 65-year-old female with history of clear cell
carcinoma of the left kidney originally diagnosed in September 2011,
status post renal ablation. Follow-up study. Nausea for 1 day.

EXAM:
CT ABDOMEN WITHOUT AND WITH CONTRAST
TECHNIQUE: Multidetector CT imaging of the abdomen was performed following the
standard protocol before and following the bolus administration of
intravenous contrast.
CONTRAST:  100mL [id number] IOPAMIDOL ([id number]) INJECTION 61%

[Series 3: arterial phase · axial · arterial · 0.75mm/px · z∈[-282,-84]mm · 7 of 88 slices shown, 12 images]
[im 11/88  soft-tissue]
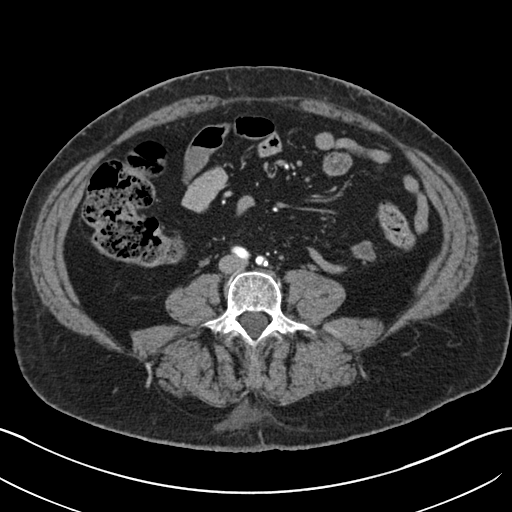
[im 11/88  bone]
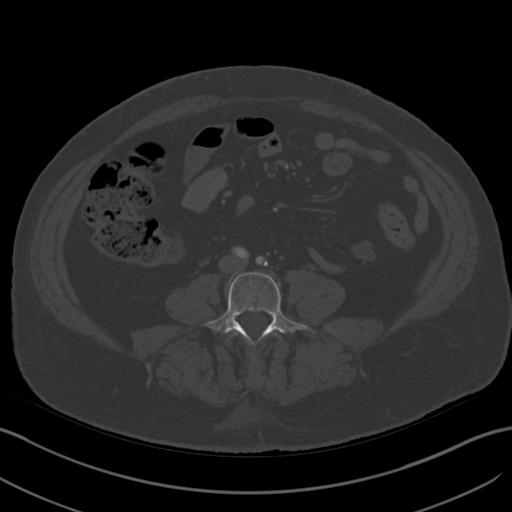
[im 22/88  soft-tissue]
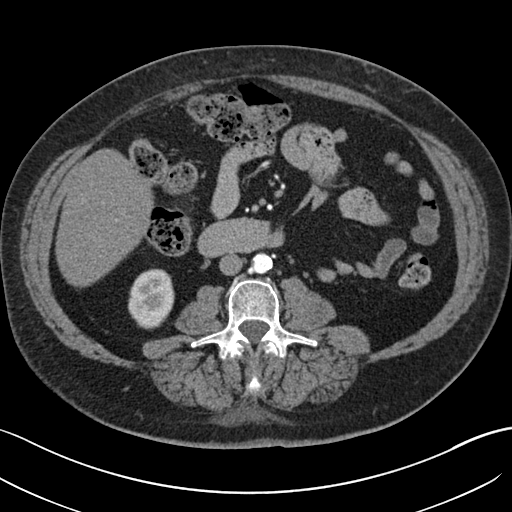
[im 33/88  soft-tissue]
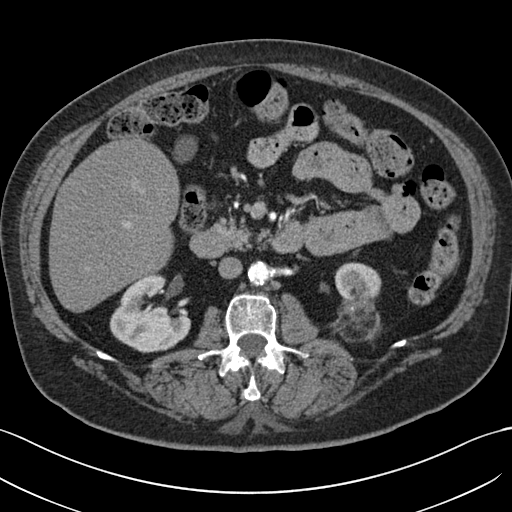
[im 44/88  soft-tissue]
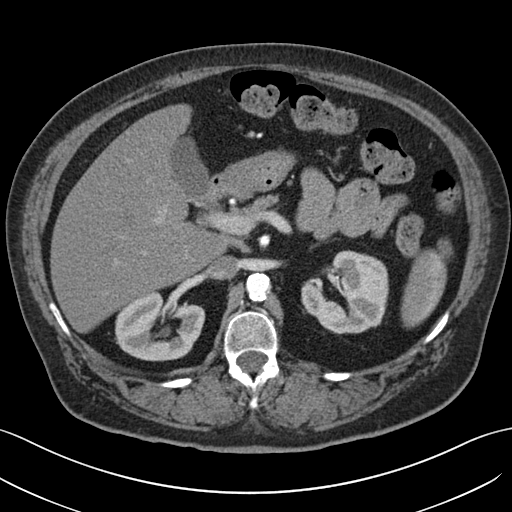
[im 44/88  lung]
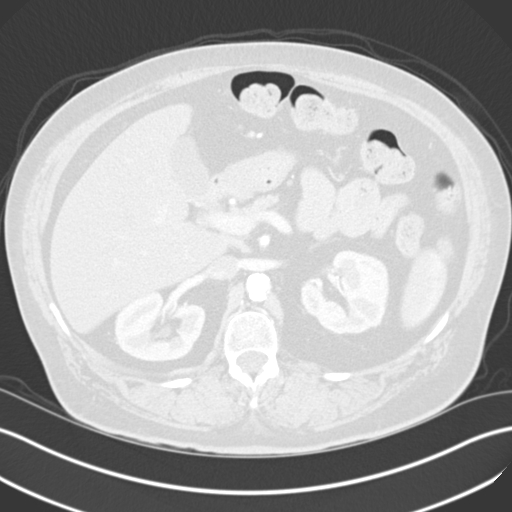
[im 55/88  soft-tissue]
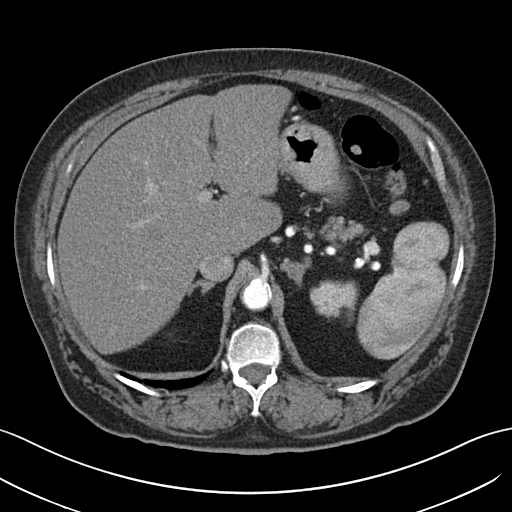
[im 55/88  lung]
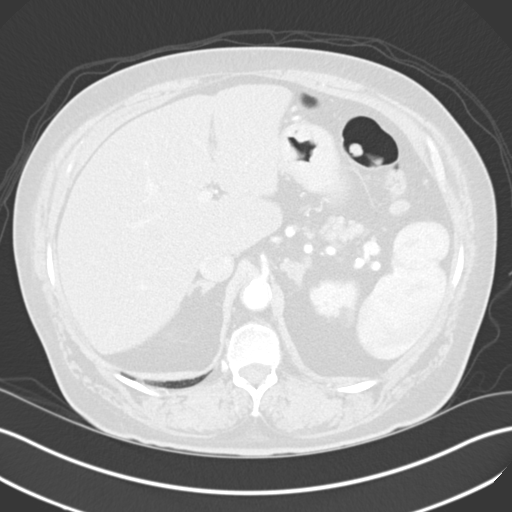
[im 66/88  soft-tissue]
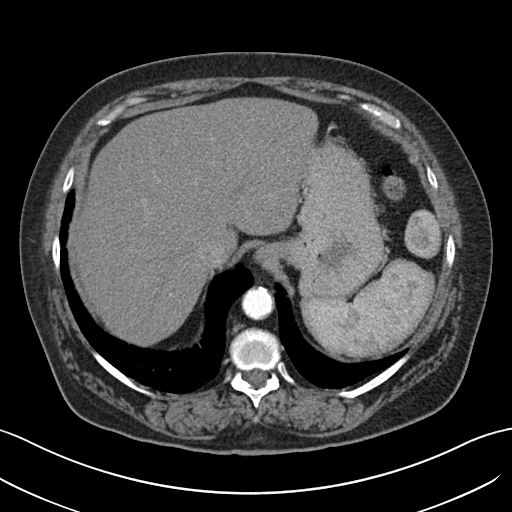
[im 66/88  lung]
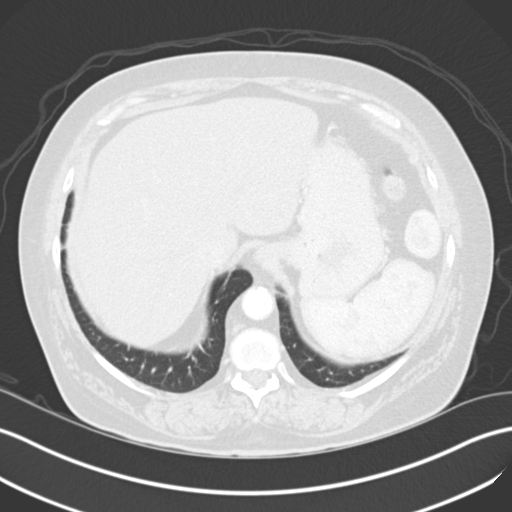
[im 77/88  soft-tissue]
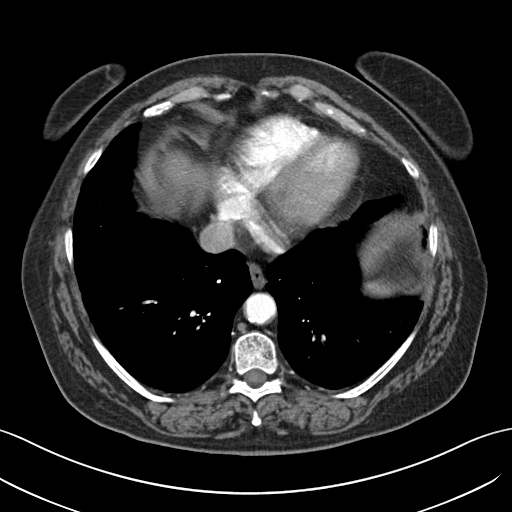
[im 77/88  lung]
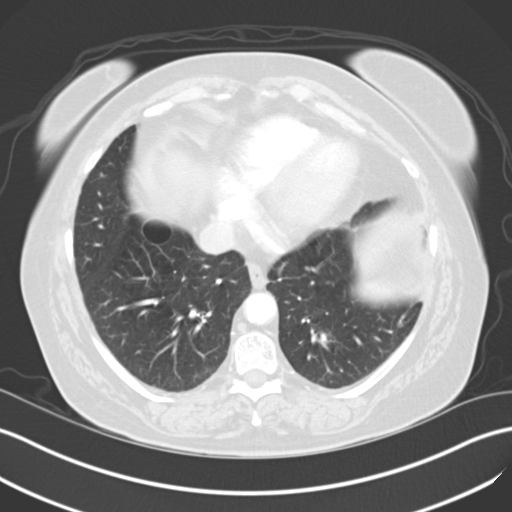

[Series 5: coronal · coronal · 0.51mm/px · 2 of 156 slices shown, 3 images]
[im 52/156  soft-tissue]
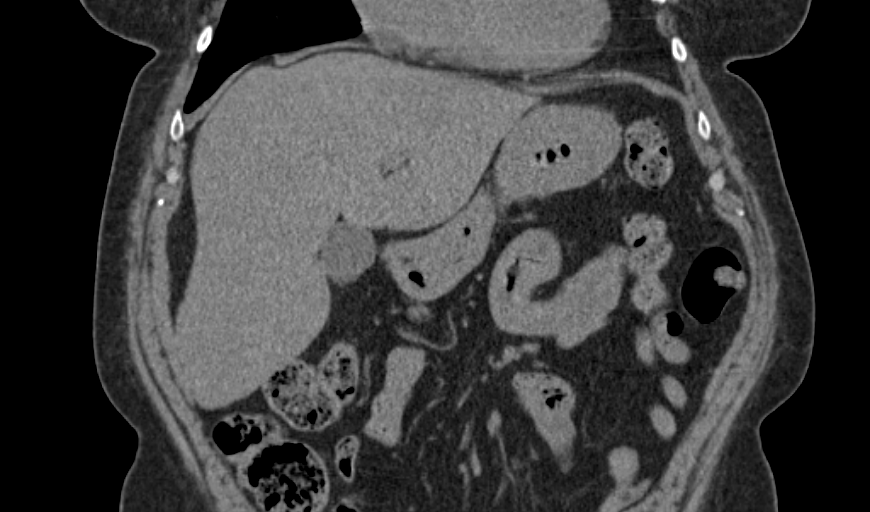
[im 52/156  bone]
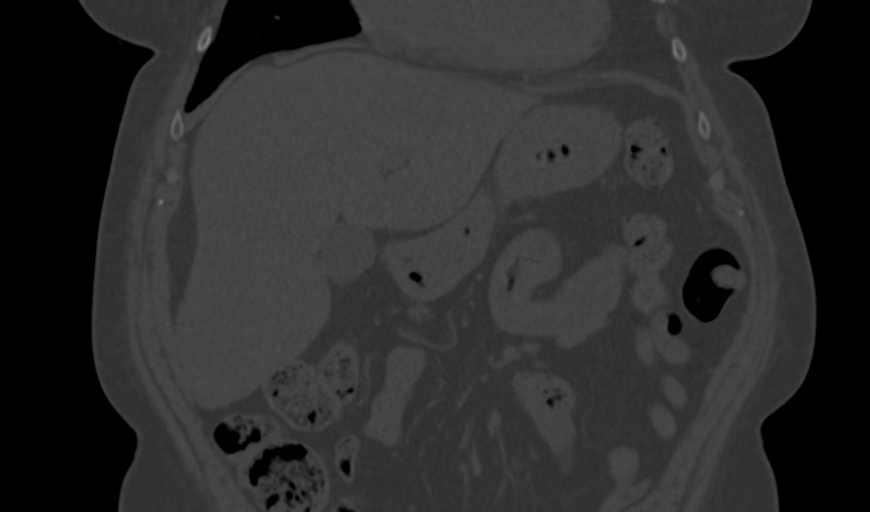
[im 104/156  soft-tissue]
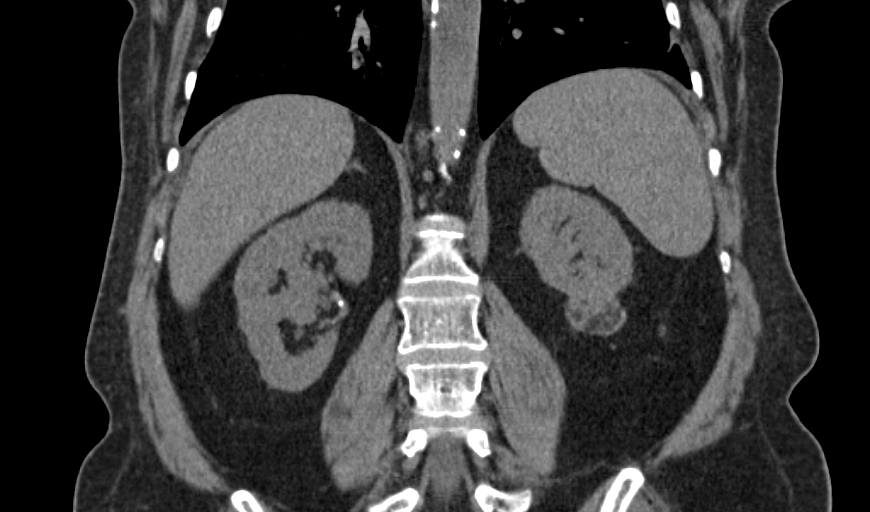

[9 of 46 positions shown; findings below may reference images not displayed]

FINDINGS: Lower chest: Mild emphysematous changes are noted throughout the
visualize lung bases. Postoperative changes of mitral valve
replacement (a stented bio prosthesis) are noted.

Hepatobiliary: Heterogeneous areas of mild decreased attenuation are
noted in the hepatic parenchyma, compatible with mild heterogeneous
hepatic steatosis. No cystic or solid hepatic lesions. No intra or
extrahepatic biliary ductal dilatation. Tiny calcified gallstones
lying dependently in the gallbladder. No findings to suggest an
acute cholecystitis at this time.

Pancreas: No pancreatic mass. No pancreatic ductal dilatation. No
pancreatic or peripancreatic fluid or inflammatory changes.

Spleen: Unremarkable.

Adrenals/Urinary Tract: Post ablation changes are again noted in the
posterior aspect of the lower pole of left kidney and in the
adjacent retroperitoneal fat. The appearance of this region is
generally typical for evolving post ablation changes, with a mixture
of fatty attenuation and some soft tissue attenuation, generally
stable compared to the prior study. The exception is a small soft
tissue nodule along the posterolateral aspect of this ablation area,
best appreciated on axial image 54 of series 4 where this area
measures 1.2 x 1.3 cm. This area of concern measures 7 HU on
precontrast images, 9 HU on arterial phase images, but increases to
30 HU on delayed phase images. No other suspicious findings are
noted. 5 mm nonobstructive calculus noted in the lower pole
collecting system of the right kidney (unchanged). Sub cm
low-attenuation lesions in the kidneys bilaterally are too small to
definitively characterize, but are very similar to the prior study,
likely to represent tiny cysts. 11 mm simple cyst in the interpolar
region of the right kidney is also noted. No hydroureteronephrosis
in the visualized abdomen. Mildly nodular contour of the left
adrenal gland is unchanged over several prior examinations,
presumably related to mild adrenal hyperplasia. Right adrenal gland
is normal in appearance.

Stomach/Bowel: Normal appearance of the stomach. No pathologic
dilatation of the visualized portions of small bowel or colon.

Vascular/Lymphatic: Aortic atherosclerosis, in addition to extensive
calcified atherosclerotic plaque throughout the other abdominal
vasculature, without evidence of aneurysm or dissection. Single
renal arteries bilaterally. No lymphadenopathy noted in the abdomen
or pelvis.

Other: No significant volume of ascites in the visualized peritoneal
cavity. No pneumoperitoneum.

Musculoskeletal: There are no aggressive appearing lytic or blastic
lesions noted in the visualized portions of the skeleton.
IMPRESSION: 1. The ablation defect in the lower pole of the left kidney is
generally stable in appearance compared to the prior examinations,
with the notable exception of a small area of soft tissue nodularity
along the posterolateral margin of the region, as detailed above.
This is favored to reflect some scar tissue given the low level
delayed enhancement (rather than arterial phase enhancement as would
be seen in the setting of local recurrence), however, close
attention on followup studies is recommended. Decrease time interval
between today's examination and the follow-up is suggested,
specifically, a repeat CT of the abdomen is recommended in 6 months.
2. Mild heterogeneous hepatic steatosis.
3. Aortic atherosclerosis.
4. Nonobstructive 5 mm calculus in the lower pole collecting system
of the right kidney.
5. Additional incidental findings, as above.
These results were called by telephone at the time of interpretation
on 08/23/2015 at [DATE] to Dr. DIGNE-MELISSA BAREDETSE, who verbally
acknowledged these results.

## 2018-01-05 ENCOUNTER — Other Ambulatory Visit: Payer: Self-pay

## 2018-01-05 NOTE — Patient Outreach (Addendum)
[facility]) Care Management   01/05/2018  ZARAY GATCHEL 1950-08-20 [id number]  [name] is an 67 y.o. female  Subjective: client reports she was asleep upon RNCM's arrival. She reports not sleeping well last night.  Objective:  BP 138/62   Pulse 70   Resp 20   Ht 1.575 m (_0 )   Wt 190 lb 6.4 oz (86.4 kg) Comment: self weight today  SpO2 96%   BMI 34.82 kg/m   Review of Systems  Respiratory: Negative for shortness of breath.        Decreased breath sounds.  Cardiovascular: Negative for leg swelling.       S1S2 noted, regular.    Physical Exam skin warm, dry, color within normal limits.  Encounter Medications:   Outpatient Encounter Medications as of 01/05/2018  Medication Sig Note  . albuterol (PROAIR HFA) 108 (90 Base) MCG/ACT inhaler Inhale 2 puffs into the lungs every 6 (six) hours as needed for shortness of breath.   . ALPRAZolam (XANAX) 1 MG tablet Take 1 tablet (1 mg) by mouth at bedtime as needed for insomnia.  May take 1/2 tablet (0.5 mg) by mouth during the day for anxiety.   . benzonatate (TESSALON) 100 MG capsule Take 1 capsule (100 mg total) by mouth 3 (three) times daily as needed for cough.   . Blood Glucose Monitoring Suppl (ONETOUCH VERIO) W/DEVICE KIT 1 each by Does not apply route 4 (four) times daily.   Marland Kitchen buPROPion (WELLBUTRIN) 75 MG tablet Take 1 tablet (75 mg total) by mouth 2 (two) times daily.   . chlorpheniramine (CHLOR-TRIMETON) 4 MG tablet Take 4 mg by mouth daily as needed for allergies.   . clobetasol cream (TEMOVATE) 5.70 % Apply 1 application topically 2 (two) times daily as needed (lichen sclerosus flare).    . DULoxetine (CYMBALTA) 60 MG capsule Take 1 capsule (60 mg total) by mouth 2 (two) times daily.   . ferrous gluconate (FERGON) 324 MG tablet Take 1 tablet (324 mg total) by mouth 2 (two) times daily with a meal. (Patient taking differently: Take 648 mg by mouth See admin instructions. Take 2 tablets (648 mg)  by mouth daily with lunch or supper)   . fluticasone (FLONASE) 50 MCG/ACT nasal spray Place 2 sprays into both nostrils daily. (Patient taking differently: Place 2 sprays into both nostrils at bedtime. )   . Fluticasone-Salmeterol (ADVAIR DISKUS) 500-50 MCG/DOSE AEPB Inhale 1 puff into the lungs 2 (two) times daily.   . furosemide (LASIX) 80 MG tablet Take 1 tablet (80 mg total) by mouth daily.   Marland Kitchen gabapentin (NEURONTIN) 300 MG capsule Take 2 capsules (600 mg total) by mouth 3 (three) times daily. (Patient taking differently: Take 900 mg by mouth 2 (two) times daily. ) 12/08/2017: Is taking 3 capsules twice/day.  Marland Kitchen glucose blood (ONETOUCH VERIO) test strip Use to check blood sugar 4 times daily. diag code E11.40. Insulin dependent   . HYDROcodone-acetaminophen (NORCO/VICODIN) 5-325 MG tablet Take 2 tablets by mouth every 6 (six) hours as needed for severe pain. (Patient taking differently: Take 2 tablets by mouth 2 (two) times daily. )   . insulin aspart (NOVOLOG FLEXPEN) 100 UNIT/ML FlexPen Inject 15 Units into the skin 4 (four) times daily -  before meals and at bedtime. (Patient taking differently: Inject 15 Units into the skin See admin instructions. Inject 15 units subcutaneously four times daily (before meals and at bedtime) plus an adjustment per sliding scale: CBG 150-200 add  5 units, 201-250 add 10 units, 251-300 add 15 units.)   . Insulin Detemir (LEVEMIR) 100 UNIT/ML Pen Inject 37 Units into the skin every morning.   . Insulin Pen Needle 32G X 4 MM MISC Use to inject insulin up to 6 times a day   . Insulin Syringe-Needle U-100 31G X 15/64" 0.5 ML MISC Use to inject insulin up to 4 times a day   . Lancets Misc. (ACCU-CHEK FASTCLIX LANCET) KIT Check your blood 4 times a day dx code 250.00 insulin requiring   . liraglutide (VICTOZA) 18 MG/3ML SOPN Inject 0.1 mLs (0.6 mg total) into the skin every evening.   . metoprolol succinate (TOPROL-XL) 50 MG 24 hr tablet Take 1 tablet (50 mg total) by  mouth daily.   . nicotine (NICODERM CQ - DOSED IN MG/24 HOURS) 14 mg/24hr patch Place 1 patch (14 mg total) onto the skin daily.   Marland Kitchen nystatin cream (MYCOSTATIN) Apply 1 application topically 2 (two) times daily as needed (rash).    Marland Kitchen omeprazole (PRILOSEC) 40 MG capsule Take 1 capsule (40 mg total) by mouth 2 (two) times daily.   . ondansetron (ZOFRAN ODT) 4 MG disintegrating tablet Take 1 tablet (4 mg total) by mouth every 8 (eight) hours as needed for nausea or vomiting.   . OXYGEN Inhale 2 L into the lungs as needed (for shortness of breath while active).    . potassium chloride SA (K-DUR,KLOR-CON) 20 MEQ tablet Take 1 tablet (20 mEq total) by mouth 2 (two) times daily.   Marland Kitchen PRESCRIPTION MEDICATION Inhale into the lungs at bedtime. CPAP   . rosuvastatin (CRESTOR) 20 MG tablet Take 1 tablet (20 mg total) by mouth at bedtime.   Marland Kitchen tiotropium (SPIRIVA HANDIHALER) 18 MCG inhalation capsule Place 1 capsule (18 mcg total) into inhaler and inhale daily.   Marland Kitchen warfarin (COUMADIN) 2.5 MG tablet TAKE 1 TABLET BY MOUTH DAILY AT 6 PM EXCEPT ON MONDAYS (Patient taking differently: Take 2.5 mg by mouth See admin instructions. Take 1 tablet (2.5 mg) by mouth daily at bedtime except on Mondays (do not take any warfarin on Monday nights))    No facility-administered encounter medications on file as of 01/05/2018.     Functional Status:   In your present state of health, do you have any difficulty performing the following activities: 12/15/2017 11/20/2017  Hearing? N N  Vision? N N  Difficulty concentrating or making decisions? N Y  Walking or climbing stairs? Y Y  Comment - -  Dressing or bathing? N Y  Doing errands, shopping? [place] and eating ? - Y  Using the Toilet? - N  In the past six months, have you accidently leaked urine? - Y  Do you have problems with loss of bowel control? - N  Managing your Medications? - N  Managing your Finances? - N  Housekeeping or managing your  Housekeeping? - Y  Some recent data might be hidden    Fall/Depression Screening:    Fall Risk  01/05/2018 12/18/2017 12/08/2017  Falls in the past year? (No Data) 1 Yes  Comment client reports she has not fallen since discharge from [facility]. - -  Number falls in past yr: - 1 2 or more  Comment - - -  Injury with Fall? - 1 Yes  Risk Factor Category  - High Risk (2 or more Points) High Fall Risk  Risk for fall due to : - History of fall(s);Impaired  balance/gait;Medication side effect;Other (Comment) History of fall(s)  Risk for fall due to: Comment - home O2 -  Follow up - Falls prevention discussed Falls prevention discussed  Comment - - -   PHQ 2/9 Scores 12/18/2017 12/15/2017 11/20/2017 11/13/2017 11/05/2017 10/15/2017 10/07/2017  PHQ - 2 Score _0 PHQ- 9 Score 14 - - _1 Assessment: 67 year old with history of (not all inclusive): COPD, atrial fibrillation, hyperlipidemia, HTN,GERD, DM, OSA, pulmonary hypertension, pulmonary emphysema, fibromyalgia, obesity. Discharged from [facility] on 12/05/17. Smoker.  Home visit. Client reports she was asleep and that she did not get much sleep last night. Client reports she is still active with home health.  Medications reviewed. Client denies any questions or issues with medication management.  History of diabetes: Blood sugar today 173 7 day average 179 n=15 14 day average 186 n=36 30 day average 203 n=87 Client declines education/reinforcement/disease management of diabetes.  RNCM encouraged client to discussed Target blood sugar range and Target A1C level.  History of COPD-client declines education reinforcement/disease management of COPD. O2 at 2liters/[place] as needed.  Client states she would like more information regarding pulmonary hypertension. EMMI articles assigned to her email. RNCM encouraged client to discuss with provider at next visit coming up. December 6th, 2019.  History of heart failure. Client has  scales, but does not weigh self. When asked why, client reports because she is just been lazy. RNCM discussed the importance of keeping up with weight in management of heart failure. Client declines disease management/education regarding heart failure.  Addendum: client also has a history of PAF. Client declines disease management regarding atrial fibrillation.  History of fibromyalgia-client reports headache and pain from fibromyalgia. She reports that it is chronic and that medication and rest helps.   Plan: RNCM assigned EMMI articles on pulmonary hypertension(sent to her email). Follow up call next month to assess if any additional care management needs.   [facility] calender/organizer provided and reviewed with client. Reinforced the disease process section and the action plans for each.  [facility]N CM Care Plan Problem One     Most Recent Value  Care Plan Problem One  at risk for readmission within the next 31 days.  Role Documenting the Problem One  Care Management [place] for Problem One  Active  [facility] Long Term Goal   client will not be readmitted within the next 31 days.  [facility]N Long Term Goal Start Date  12/08/17  Interventions for Problem One Long Term Goal  RNCM encouraged client to discussed Target blood sugar range and Target A1C level. RNCM discussed the importance of keeping up with weight in management of heart failure.   [facility]N CM Short Term Goal #1   client will verbalize follow up appontment scheduled within the next 30 days.  [facility]N CM Short Term Goal #1 Start Date  12/08/17  Interventions for Short Term Goal #1  reinforced upcoming appointment and recommendations for what to discuss with primary care.  [facility]N CM Short Term Goal #2   client will verbalize contact with [facility] social work and pharmacy within the next 30 days.  THN CM Short Term Goal #2 Start Date  12/08/17  [facility] CM Short Term Goal #2 Met Date  12/15/17    [facility] CM Care Plan Problem Two     Most Recent Value  Care Plan  Problem Two  Fall risk as evidence by client reporting that she  fell prior to leaving skilled facility.  Role Documenting the Problem Two  Care Management [place] for Problem Two  Active  Interventions for Problem Two Long Term Goal   discussed having good lighting, clear walkway, avoid tripping hazards and cords on the floor.  [place] Term Goal  client will verbalize at least three fall prevention strategies within the next 31 days.  THN Long Term Goal Start Date  12/15/17  [facility] CM Short Term Goal #1   client will verbalize receipt of EMMI material withn the next 30 days.  THN CM Short Term Goal #1 Start Date  12/15/17  [facility] CM Short Term Goal #1 Met Date   01/05/18     Thea Silversmith, RN, MSN, [place] Coordinator Cell: [phone]

## 2018-01-06 ENCOUNTER — Other Ambulatory Visit: Payer: Self-pay | Admitting: Internal Medicine

## 2018-01-06 DIAGNOSIS — R110 Nausea: Secondary | ICD-10-CM

## 2018-01-06 DIAGNOSIS — J301 Allergic rhinitis due to pollen: Secondary | ICD-10-CM

## 2018-01-14 ENCOUNTER — Telehealth: Payer: Self-pay

## 2018-01-14 NOTE — Telephone Encounter (Signed)
PT [name] called to say she would see pt 1x week for 2 more weeks to prepare her for disch from [facility] PT. She states they have spoken about pt being active with a group like [facility] but pt refuses she does seem interested in the [place] [facility] older adult program and some volunteering. They will work toward this for the remainder of visits. Do you agree with this for VO?

## 2018-01-14 NOTE — Telephone Encounter (Signed)
Rtc, lm for rtc for exact VO needed for care

## 2018-01-14 NOTE — Telephone Encounter (Signed)
[name] with [facility] requesting VO for PT. Please call back.

## 2018-01-14 NOTE — Telephone Encounter (Signed)
Yes.  Please continue Home Health Physical Therapy 1x week for 2 more weeks to prepare her for disch from [facility] PT.  Thank You.

## 2018-01-15 ENCOUNTER — Ambulatory Visit (INDEPENDENT_AMBULATORY_CARE_PROVIDER_SITE_OTHER): Payer: PPO | Admitting: Internal Medicine

## 2018-01-15 ENCOUNTER — Encounter: Payer: Self-pay | Admitting: Internal Medicine

## 2018-01-15 ENCOUNTER — Ambulatory Visit (HOSPITAL_COMMUNITY)
Admission: RE | Admit: 2018-01-15 | Discharge: 2018-01-15 | Disposition: A | Discharge status: Home / Self Care | Payer: PPO | Source: Ambulatory Visit | Attending: Internal Medicine | Admitting: Internal Medicine

## 2018-01-15 ENCOUNTER — Ambulatory Visit (INDEPENDENT_AMBULATORY_CARE_PROVIDER_SITE_OTHER): Payer: PPO | Admitting: Pharmacist

## 2018-01-15 ENCOUNTER — Other Ambulatory Visit: Payer: Self-pay | Admitting: Internal Medicine

## 2018-01-15 ENCOUNTER — Telehealth: Payer: Self-pay | Admitting: *Deleted

## 2018-01-15 ENCOUNTER — Encounter (HOSPITAL_COMMUNITY): Payer: Self-pay

## 2018-01-15 VITALS — BP 128/60 | HR 72 | Temp 98.3°F | Wt 190.5 lb

## 2018-01-15 VITALS — BP 122/55 | HR 66

## 2018-01-15 DIAGNOSIS — I70209 Unspecified atherosclerosis of native arteries of extremities, unspecified extremity: Secondary | ICD-10-CM | POA: Insufficient documentation

## 2018-01-15 DIAGNOSIS — R110 Nausea: Secondary | ICD-10-CM

## 2018-01-15 DIAGNOSIS — W19XXXA Unspecified fall, initial encounter: Secondary | ICD-10-CM | POA: Diagnosis not present

## 2018-01-15 DIAGNOSIS — F419 Anxiety disorder, unspecified: Secondary | ICD-10-CM

## 2018-01-15 DIAGNOSIS — Z720 Tobacco use: Secondary | ICD-10-CM

## 2018-01-15 DIAGNOSIS — E1140 Type 2 diabetes mellitus with diabetic neuropathy, unspecified: Secondary | ICD-10-CM | POA: Diagnosis not present

## 2018-01-15 DIAGNOSIS — Z043 Encounter for examination and observation following other accident: Secondary | ICD-10-CM | POA: Insufficient documentation

## 2018-01-15 DIAGNOSIS — M545 Low back pain, unspecified: Secondary | ICD-10-CM

## 2018-01-15 DIAGNOSIS — Z5181 Encounter for therapeutic drug level monitoring: Secondary | ICD-10-CM | POA: Diagnosis not present

## 2018-01-15 DIAGNOSIS — Z79899 Other long term (current) drug therapy: Secondary | ICD-10-CM

## 2018-01-15 DIAGNOSIS — I10 Essential (primary) hypertension: Secondary | ICD-10-CM | POA: Diagnosis not present

## 2018-01-15 DIAGNOSIS — Z79891 Long term (current) use of opiate analgesic: Secondary | ICD-10-CM

## 2018-01-15 DIAGNOSIS — S79911A Unspecified injury of right hip, initial encounter: Secondary | ICD-10-CM | POA: Diagnosis not present

## 2018-01-15 DIAGNOSIS — I480 Paroxysmal atrial fibrillation: Secondary | ICD-10-CM

## 2018-01-15 DIAGNOSIS — Z9181 History of falling: Secondary | ICD-10-CM

## 2018-01-15 DIAGNOSIS — Z7901 Long term (current) use of anticoagulants: Secondary | ICD-10-CM

## 2018-01-15 DIAGNOSIS — J439 Emphysema, unspecified: Secondary | ICD-10-CM | POA: Diagnosis not present

## 2018-01-15 DIAGNOSIS — M25552 Pain in left hip: Secondary | ICD-10-CM | POA: Diagnosis not present

## 2018-01-15 DIAGNOSIS — F17200 Nicotine dependence, unspecified, uncomplicated: Secondary | ICD-10-CM

## 2018-01-15 DIAGNOSIS — N904 Leukoplakia of vulva: Secondary | ICD-10-CM

## 2018-01-15 DIAGNOSIS — Z794 Long term (current) use of insulin: Secondary | ICD-10-CM

## 2018-01-15 DIAGNOSIS — Z7951 Long term (current) use of inhaled steroids: Secondary | ICD-10-CM

## 2018-01-15 DIAGNOSIS — M810 Age-related osteoporosis without current pathological fracture: Secondary | ICD-10-CM

## 2018-01-15 DIAGNOSIS — G8929 Other chronic pain: Secondary | ICD-10-CM

## 2018-01-15 LAB — POCT INR: INR: 2.6 (ref 2.0–3.0)

## 2018-01-15 LAB — GLUCOSE, CAPILLARY
Glucose-Capillary: 99 mg/dL (ref 70–99)
Glucose-Capillary: 140 mg/dL — ABNORMAL HIGH (ref 70–99)

## 2018-01-15 MED ORDER — HYDROCODONE-ACETAMINOPHEN 5-325 MG PO TABS: 1.0000 | ORAL_TABLET | Freq: Four times a day (QID) | ORAL | 0 refills | Status: DC | PRN

## 2018-01-15 MED ORDER — KETOROLAC TROMETHAMINE 30 MG/ML IJ SOLN
30.0000 mg | Freq: Once | INTRAMUSCULAR | Status: AC
  Administered 2018-01-15: 30 mg via INTRAMUSCULAR

## 2018-01-15 MED ORDER — KETOROLAC TROMETHAMINE 30 MG/ML IJ SOLN: 30.0000 mg | Freq: Once | INTRAMUSCULAR | Status: DC

## 2018-01-15 MED ORDER — IBUPROFEN 800 MG PO TABS: 800.0000 mg | ORAL_TABLET | Freq: Three times a day (TID) | ORAL | 0 refills | Status: DC | PRN

## 2018-01-15 MED ORDER — PROMETHAZINE HCL 25 MG PO TABS
25.0000 mg | ORAL_TABLET | Freq: Once | ORAL | Status: AC
  Administered 2018-01-15: 25 mg via ORAL

## 2018-01-15 MED ORDER — LIRAGLUTIDE 18 MG/3ML ~~LOC~~ SOPN: 1.8000 mg | PEN_INJECTOR | Freq: Every day | SUBCUTANEOUS | 11 refills | Status: DC

## 2018-01-15 MED ORDER — CLOBETASOL PROPIONATE 0.05 % EX CREA: 1.0000 "application " | TOPICAL_CREAM | Freq: Two times a day (BID) | CUTANEOUS | 3 refills | Status: DC | PRN

## 2018-01-15 MED ORDER — ONDANSETRON HCL 4 MG PO TABS: 4.0000 mg | ORAL_TABLET | Freq: Three times a day (TID) | ORAL | 0 refills | Status: DC | PRN

## 2018-01-15 MED ORDER — BENZONATATE 100 MG PO CAPS: 100.0000 mg | ORAL_CAPSULE | Freq: Three times a day (TID) | ORAL | 11 refills | Status: DC | PRN

## 2018-01-15 NOTE — Patient Instructions (Signed)
[name],  It is a pleasure seeing you today. You will be contacted with the results of your x-rays.  If your symptoms become worse please go to the ED.

## 2018-01-15 NOTE — Progress Notes (Signed)
CC: pain to right buttocks after fall  HPI:  [name] is a 67 y.o. female with history noted below that presents to the acute care clinic for pain to the right buttocks after fall.  Patient states she was running errands when she got out of her vehicle lost her balance and fell.  She states she landed on her right side. She did not hit her head or land on her arm.  She states the pain is localized to her right buttocks with radiating pain down the back of her right leg to her knee. She denies weakness, numbness or changes in sensation. She is able to walk but has to use a cane for support due to the pain.  She reports nausea with pain.  Past Medical History:  Diagnosis Date  . Adenomatous polyps 05/14/2011   Colonoscopy (05/2011): 4 mm adenomatous polyp excised endoscopically Colonoscopy (02/2002): Adenomatous polyp excised endoscopically   . Allergic rhinitis 06/01/2012  . Anemia of chronic disease 01/01/2013  . Anxiety 07/24/2010  . Aortic atherosclerosis ([place]) 10/19/2014   Seen on CT scan, currently asymptomatic  . 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 Arthritis    "lower back" (11/13/2017)  . Asymptomatic cholelithiasis 09/25/2015   Seen on CT scan 08/2015  . Carotid artery stenosis    s/p right endarterectomy (06/2010) Carotid US (07/2010):  Left: Moderate-to-severe (60-79%) calcific and non-calcific plaque origin and proximal ICA and ECA   . Chronic congestive heart failure with left ventricular diastolic dysfunction ([place]) 10/21/2010  . Chronic constipation 02/03/2011  . Chronic daily headache 01/16/2014  . Chronic headache    "weekly now" (11/13/2017)  . Chronic low back pain 10/06/2012  . Chronic venous insufficiency 08/04/2012  . Clear cell renal cell carcinoma ([place]) 07/21/2011   s/p cryoablation of left RCC in 09/2011 by [name]. Followed by [name]  [facility]) .    Marland Kitchen  COPD (chronic obstructive pulmonary disease) with emphysema (HCC)    PFTs 02/2012: FEV1 0.92 (40%), ratio 69, 27% increase in FEV1 with BD, TLC 91%, severe airtrapping, DLCO49% On chronic home O2. Pulmonary rehab referral 05/2012   . Depression 11/19/2005  . Esophageal stenosis 08/08/2015   Mild, benign-appearing on EGD 06/29/2015  . Fibromyalgia 08/29/2010  . Gastroesophageal reflux disease   . History of blood transfusion    "several times"   . History of hiatal hernia   . History of kidney stones   . Hyperlipidemia LDL goal < 100 11/20/2005  . Internal hemorrhoids 08/04/2012  . Lichen sclerosus of female genitalia 01/12/2017  . Mitral stenosis    s/p Mitral valve replacement with a 27-mm pericardial porcine valve (Medtronic Mosaic valve, serial #[id number] on 09/20/10, Dr. Prescott Gum)   . Moderate to severe pulmonary hypertension ([place])    2014 TEE w PA peak pressure 46 mmHg, s/p MV replacement   . Moderately severe major depression ([place]) 11/19/2005  . Obesity (BMI 30.0-34.9) 10/23/2011  . Obstructive sleep apnea    Nocturnal polysomnography (06/2009): Moderate sleep apnea/ hypopnea syndrome , AHI 17.8 per hour with nonpositional hypopneas. CPAP titration to 12 CWP, AHI 2.4 per hour. On nocturnal CPAP via a small resMed Quattro full-face mask with heated humidifier.   . On home oxygen therapy    "2.5L; anytime I sleep; day or night" (11/13/2017)  . OSA on CPAP    "w/my oxygen" (11/13/2017)  . Osteoporosis    DEXA (12/09/2011):  L-spine T -3.7, left hip T -1.4 DEXA (12/2004): L-spine T -2.6, left hip -0.1   . Paroxysmal atrial fibrillation ([place]) 10/22/2010   s/p Left atrial maze procedure for paroxysmal atrial fibrillation on 09/20/2010 by Dr Prescott Gum.  Subsequent splenic infarct, decision was made to re-anticoagulate with coumadin, likely life-long as this is the most likely cause of the splenic infarct.   . Pneumonia    "once" (11/13/2017)  . Pulmonary hypertension due to chronic obstructive  pulmonary disease ([place]) 04/25/2016  . Right nephrolithiasis 09/06/2014   5 mm non-obstructing calculus seen on CT scan 09/05/2014   . Seborrheic keratosis 09/28/2015  . Severe obesity (BMI 35.0-39.9) with comorbidity ([place]) 10/23/2011  . Shortness of breath dyspnea   . Tobacco abuse 07/28/2012  . Type 2 diabetes mellitus with diabetic neuropathy ([place])     Review of Systems:  Review of Systems  Gastrointestinal: Positive for nausea.  Musculoskeletal: Positive for falls.  Neurological: Negative for dizziness, focal weakness and weakness.     Physical Exam:  Vitals:   01/15/18 1432  BP: (!) 122/55  Pulse: 66  SpO2: 92%   Physical Exam  Constitutional:  Mild distress  Musculoskeletal:  5/5 strength in lower extremities bilaterally Sensation intact to lower extremities bilaterally Pain on internal, external and flexion of the hip Tenderness to palpation to right ischial tuberosity     Assessment & Plan:   See encounters tab for problem based medical decision making.    Patient discussed with Dr. Lynnae January

## 2018-01-15 NOTE — Progress Notes (Signed)
Anticoagulation Management [name] is a 67 y.o. female who reports to the clinic for monitoring of warfarin treatment.    Indication: atrial fibrillation (also history of GI bleed with AVM) Duration:indefinite  Supervising physician: [name]  Anticoagulation Clinic Visit History: Patient does not report signs/symptoms of bleeding or thromboembolism   Anticoagulation Episode Summary    Current INR goal:   1.5-2.5  TTR:   82.5 % (4.6 y)  Next INR check:   02/19/2018  INR from last check:   2.6! (01/15/2018)  Weekly max warfarin dose:     Target end date:     INR check location:   Anticoagulation Clinic  Preferred lab:     Send INR reminders to:   [facility]   Indications   PAF (paroxysmal atrial fibrillation) ([place]) [I48.0] Long term current use of anticoagulant therapy [Z79.01]       Comments:         Anticoagulation Care Providers    Provider Role Specialty Phone number   Lelon Perla, MD  Cardiology [phone]     Allergies  Allergen Reactions  . Lorazepam Other (See Comments)    Patient's sister noted that ativan caused the patient to become extremely confused during hospitalization 09/2010; tolerates Xanax  . Morphine And Related Other (See Comments)    Injection site reaction  . Oxycontin [Oxycodone] Other (See Comments)    headache  . Tramadol Hcl Swelling    Ankle swelling   Medication Sig  albuterol (PROAIR HFA) 108 (90 Base) MCG/ACT inhaler Inhale 2 puffs into the lungs every 6 (six) hours as needed for shortness of breath.  ALPRAZolam (XANAX) 1 MG tablet Take 1 tablet (1 mg) by mouth at bedtime as needed for insomnia.  May take 1/2 tablet (0.5 mg) by mouth during the day for anxiety.  benzonatate (TESSALON) 100 MG capsule Take 1 capsule (100 mg total) by mouth 3 (three) times daily as needed for cough.  Blood Glucose Monitoring Suppl (ONETOUCH VERIO) W/DEVICE KIT 1 each by Does not apply route 4 (four) times daily.   buPROPion (WELLBUTRIN) 75 MG tablet Take 1 tablet (75 mg total) by mouth 2 (two) times daily.  chlorpheniramine (CHLOR-TRIMETON) 4 MG tablet Take 4 mg by mouth daily as needed for allergies.  clobetasol cream (TEMOVATE) 4.08 % Apply 1 application topically 2 (two) times daily as needed (lichen sclerosus flare).  DULoxetine (CYMBALTA) 60 MG capsule Take 1 capsule (60 mg total) by mouth 2 (two) times daily.  ferrous gluconate (FERGON) 324 MG tablet Take 1 tablet (324 mg total) by mouth 2 (two) times daily with a meal. Patient taking differently: Take 648 mg by mouth See admin instructions. Take 2 tablets (648 mg) by mouth daily with lunch or supper  fluticasone (FLONASE) 50 MCG/ACT nasal spray Place 2 sprays into both nostrils daily.  Fluticasone-Salmeterol (ADVAIR DISKUS) 500-50 MCG/DOSE AEPB Inhale 1 puff into the lungs 2 (two) times daily.  furosemide (LASIX) 80 MG tablet Take 1 tablet (80 mg total) by mouth daily.  gabapentin (NEURONTIN) 300 MG capsule Take 2 capsules (600 mg total) by mouth 3 (three) times daily. Patient taking differently: Take 900 mg by mouth 2 (two) times daily.   glucose blood (ONETOUCH VERIO) test strip Use to check blood sugar 4 times daily. diag code E11.40. Insulin dependent  HYDROcodone-acetaminophen (NORCO/VICODIN) 5-325 MG tablet Take 1-2 tablets by mouth every 6 (six) hours as needed for severe pain.  insulin aspart (NOVOLOG FLEXPEN) 100 UNIT/ML  FlexPen Inject 15 Units into the skin 4 (four) times daily -  before meals and at bedtime. Patient not taking: Reported on 01/05/2018  Insulin Detemir (LEVEMIR) 100 UNIT/ML Pen Inject 37 Units into the skin every morning.  Insulin Pen Needle 32G X 4 MM MISC Use to inject insulin up to 6 times a day  Insulin Syringe-Needle U-100 31G X 15/64" 0.5 ML MISC Use to inject insulin up to 4 times a day  ipratropium-albuterol (DUONEB) 0.5-2.5 (3) MG/3ML SOLN Take 3 mLs by nebulization every 6 (six) hours as needed.  Lancets Misc.  (ACCU-CHEK FASTCLIX LANCET) KIT Check your blood 4 times a day dx code 250.00 insulin requiring  liraglutide (VICTOZA) 18 MG/3ML SOPN Inject 0.3 mLs (1.8 mg total) into the skin daily.  metoprolol succinate (TOPROL-XL) 50 MG 24 hr tablet Take 1 tablet (50 mg total) by mouth daily.  nicotine (NICODERM CQ - DOSED IN MG/24 HOURS) 14 mg/24hr patch Place 1 patch (14 mg total) onto the skin daily.  nystatin cream (MYCOSTATIN) Apply 1 application topically 2 (two) times daily as needed (rash).   omeprazole (PRILOSEC) 40 MG capsule Take 1 capsule (40 mg total) by mouth 2 (two) times daily.  ondansetron (ZOFRAN-ODT) 4 MG disintegrating tablet Take 1 tablet (4 mg total) by mouth every 8 (eight) hours as needed for nausea or vomiting.  OXYGEN Inhale 2 L into the lungs as needed (for shortness of breath while active).   potassium chloride SA (K-DUR,KLOR-CON) 20 MEQ tablet Take 1 tablet (20 mEq total) by mouth 2 (two) times daily.  PRESCRIPTION MEDICATION Inhale into the lungs at bedtime. CPAP  rosuvastatin (CRESTOR) 20 MG tablet Take 1 tablet (20 mg total) by mouth at bedtime.  tiotropium (SPIRIVA HANDIHALER) 18 MCG inhalation capsule Place 1 capsule (18 mcg total) into inhaler and inhale daily.  warfarin (COUMADIN) 2.5 MG tablet TAKE 1 TABLET BY MOUTH DAILY AT 6 PM EXCEPT ON MONDAYS Patient taking differently: Take 2.5 mg by mouth See admin instructions. Take 1 tablet (2.5 mg) by mouth daily at bedtime except on Mondays (do not take any warfarin on Monday nights)   Past Medical History:  Diagnosis Date  . Adenomatous polyps 05/14/2011   Colonoscopy (05/2011): 4 mm adenomatous polyp excised endoscopically Colonoscopy (02/2002): Adenomatous polyp excised endoscopically   . Allergic rhinitis 06/01/2012  . Anemia of chronic disease 01/01/2013  . Anxiety 07/24/2010  . Aortic atherosclerosis ([place]) 10/19/2014   Seen on CT scan, currently asymptomatic  . Arteriovenous malformation of gastrointestinal tract 08/08/2015    Non-bleeding when visualized on capsule endoscopy 06/30/2015   . Arthritis    "lower back" (11/13/2017)  . Asymptomatic cholelithiasis 09/25/2015   Seen on CT scan 08/2015  . Carotid artery stenosis    s/p right endarterectomy (06/2010) Carotid US (07/2010):  Left: Moderate-to-severe (60-79%) calcific and non-calcific plaque origin and proximal ICA and ECA   . Chronic congestive heart failure with left ventricular diastolic dysfunction ([place]) 10/21/2010  . Chronic constipation 02/03/2011  . Chronic daily headache 01/16/2014  . Chronic headache    "weekly now" (11/13/2017)  . Chronic low back pain 10/06/2012  . Chronic venous insufficiency 08/04/2012  . Clear cell renal cell carcinoma ([place]) 07/21/2011   s/p cryoablation of left RCC in 09/2011 by [name]. Followed by [name]  [facility]) .    Marland Kitchen COPD (chronic obstructive pulmonary disease) with emphysema ([facility])    PFTs 02/2012: FEV1 0.92 (40%), ratio 69, 27% increase in FEV1 with BD, TLC 91%, severe  airtrapping, DLCO49% On chronic home O2. Pulmonary rehab referral 05/2012   . Depression 11/19/2005  . Esophageal stenosis 08/08/2015   Mild, benign-appearing on EGD 06/29/2015  . Fibromyalgia 08/29/2010  . Gastroesophageal reflux disease   . History of blood transfusion    "several times"   . History of hiatal hernia   . History of kidney stones   . Hyperlipidemia LDL goal < 100 11/20/2005  . Internal hemorrhoids 08/04/2012  . Lichen sclerosus of female genitalia 01/12/2017  . Mitral stenosis    s/p Mitral valve replacement with a 27-mm pericardial porcine valve (Medtronic Mosaic valve, serial #[id number] on 09/20/10, Dr. Prescott Gum)   . Moderate to severe pulmonary hypertension ([place])    2014 TEE w PA peak pressure 46 mmHg, s/p MV replacement   . Moderately severe major depression ([place]) 11/19/2005  . Obesity (BMI 30.0-34.9) 10/23/2011  . Obstructive sleep apnea    Nocturnal polysomnography (06/2009): Moderate sleep apnea/ hypopnea syndrome  , AHI 17.8 per hour with nonpositional hypopneas. CPAP titration to 12 CWP, AHI 2.4 per hour. On nocturnal CPAP via a small resMed Quattro full-face mask with heated humidifier.   . On home oxygen therapy    "2.5L; anytime I sleep; day or night" (11/13/2017)  . OSA on CPAP    "w/my oxygen" (11/13/2017)  . Osteoporosis    DEXA (12/09/2011): L-spine T -3.7, left hip T -1.4 DEXA (12/2004): L-spine T -2.6, left hip -0.1   . Paroxysmal atrial fibrillation ([place]) 10/22/2010   s/p Left atrial maze procedure for paroxysmal atrial fibrillation on 09/20/2010 by Dr Prescott Gum.  Subsequent splenic infarct, decision was made to re-anticoagulate with coumadin, likely life-long as this is the most likely cause of the splenic infarct.   . Pneumonia    "once" (11/13/2017)  . Pulmonary hypertension due to chronic obstructive pulmonary disease ([place]) 04/25/2016  . Right nephrolithiasis 09/06/2014   5 mm non-obstructing calculus seen on CT scan 09/05/2014   . Seborrheic keratosis 09/28/2015  . Severe obesity (BMI 35.0-39.9) with comorbidity ([place]) 10/23/2011  . Shortness of breath dyspnea   . Tobacco abuse 07/28/2012  . Type 2 diabetes mellitus with diabetic neuropathy ([facility])    Social History   Socioeconomic History  . Marital status: Divorced    Spouse name: Not on file  . Number of children: Not on file  . Years of education: Not on file  . Highest education level: Not on file  Occupational History  . Not on file  Social Needs  . Financial resource strain: Not on file  . Food insecurity:    Worry: Not on file    Inability: Not on file  . Transportation needs:    Medical: Not on file    Non-medical: Not on file  Tobacco Use  . Smoking status: Current Every Day Smoker    Packs/day: 1.00    Years: 55.00    Pack years: 55.00    Types: Cigarettes    Start date: 11/01/1962  . Smokeless tobacco: Never Used  Substance and Sexual Activity  . Alcohol use: No    Alcohol/week: 0.0 standard drinks  . Drug use: No   . Sexual activity: Not Currently    Birth control/protection: Post-menopausal  Lifestyle  . Physical activity:    Days per week: Not on file    Minutes per session: Not on file  . Stress: Not on file  Relationships  . Social connections:    Talks on phone: Not on file  Gets together: Not on file    Attends religious service: Not on file    Active member of club or organization: Not on file    Attends meetings of clubs or organizations: Not on file    Relationship status: Not on file  Other Topics Concern  . Not on file  Social History Narrative   Lives alone in [place] ([place])   Worked at [employer] for 18 years   No car   Family History  Problem Relation Age of Onset  . Peptic Ulcer Disease Father   . Heart attack Father 33       Died of MI at age 25  . Heart attack Brother 93       Died of MI at age 57  . Obesity Brother   . Pneumonia Mother   . Healthy Sister   . Lupus Daughter   . Obsessive Compulsive Disorder Daughter    ASSESSMENT Lab Results  Component Value Date   INR 2.6 01/15/2018   INR 1.68 11/18/2017   INR 1.55 11/17/2017   Anticoagulation Dosing: Description   Take ONE (1)  tablet of your 2.31m green-colored warfarin tablet by mouth, once-daily at 6PM all days of week, EXCEPT on Mondays. Do NOT take any warfarin on Mondays of each week.      INR today: Therapeutic  PLAN Weekly dose was unchanged   There are no Patient Instructions on file for this visit. Patient advised to contact clinic or seek medical attention if signs/symptoms of bleeding or thromboembolism occur.  Patient verbalized understanding by repeating back information and was advised to contact me if further medication-related questions arise. Patient was also provided an information handout.  Follow-up 4-6 weeks   JFlossie Dibble

## 2018-01-15 NOTE — Assessment & Plan Note (Signed)
Assessment  Review of her glucometer [place] today reveals 80 readings with the average glucose of 188, 14% within target.  This is somewhat surprising as her most recent hemoglobin A1c was 6.7.  There was some confusion after her recent discharge and her regimen was eventually reestablished to the one that was controlling her blood sugars well.  She is currently on Levemir 37 units every morning, NovoLog 15 units with meals and bedtime, and Victoza 1.8 mg daily.  Her neuropathy pain is well controlled on the gabapentin 900 mg by mouth twice daily.  Plan  We will continue the Levemir 37 units every morning, Victoza at 1.8 mg daily, NovoLog at 15 units with meals and at bedtime and reassess glycemic control with a repeat hemoglobin A1c at the follow-up visit.  She remains on a high intensity statin given her diabetes.  We will also continue the gabapentin 900 mg by mouth twice daily for her peripheral neuropathy.

## 2018-01-15 NOTE — Progress Notes (Signed)
   Subjective:    Patient ID: [name], female    DOB: [birth year], 67 y.o.   MRN: [id number]  HPI  [name] is here for follow-up of her essential hypertension, type 2 diabetes with diabetic neuropathy, pulmonary emphysema, tobacco abuse, paroxysmal atrial fibrillation, chronic low back pain, and lichen sclerosus of her genitalia. Please see the A&P for the status of the pt's chronic medical problems.  She is without acute complaints today.  Review of Systems  Constitutional: Negative for activity change, appetite change and unexpected weight change.  Respiratory: Positive for cough and shortness of breath.        Cough responds very well to [employer]  Cardiovascular: Negative for chest pain and leg swelling.  Gastrointestinal: Positive for abdominal pain.  Genitourinary: Negative for genital sores and vaginal pain.  Musculoskeletal: Positive for arthralgias and back pain. Negative for joint swelling.  Skin: Negative for rash and wound.  Psychiatric/Behavioral: The patient is nervous/anxious.       Objective:   Physical Exam  Constitutional: She is oriented to person, place, and time. She appears well-developed and well-nourished. No distress.  HENT:  Head: Normocephalic and atraumatic.  Eyes: Conjunctivae are normal. Right eye exhibits no discharge. Left eye exhibits no discharge. No scleral icterus.  Musculoskeletal: Normal range of motion. She exhibits no edema or deformity.  Neurological: She is alert and oriented to person, place, and time. She exhibits normal muscle tone.  Skin: Skin is warm and dry. No rash noted. She is not diaphoretic. No erythema. No pallor.  Psychiatric: She has a normal mood and affect. Her behavior is normal. Judgment and thought content normal.  Nursing note and vitals reviewed.     Assessment & Plan:   Please see problem based charting.

## 2018-01-15 NOTE — Patient Instructions (Addendum)
It was good to see you again.  1) Keep taking all of your medications as you are.  2) I increased your hydrocodone to #120 per month as you are taking them 4 times a day.  3) I renewed your cream and cough medicine as well.  4) Congrats on trying to work on quitting smoking!  5) If you change your mind about the pain clinic referral let me know.  I will see you back in 3 months, sooner if necessary.

## 2018-01-15 NOTE — Assessment & Plan Note (Signed)
Assessment  Her chronic low back pain is unchanged from the previous visit.  She denies any numbness or weakness.  The hydrocodone-acetaminophen 5-325 mg 1 to 2 tablets every 6 hours as needed dispense #100are adequate for pain relief and allowing her to ambulate around her house, but she would like to return to the # 120/month as she needs this medication at 4 tablets daily.  Plan  We will continue the hydrocodone-acetaminophen 5-325 mg 1 to 2 tablets every 6 hours as needed dispense # 120 pills/month.  A refill was electronically sent today with 2 additional refills 30 days and 60 days thereafter.  We will reassess the efficacy of this therapy on allowing her to continue to perform her activities of daily living with control of her pain.

## 2018-01-15 NOTE — Assessment & Plan Note (Signed)
Assessment  Her blood pressure today was 128/60 which is within target.  Her current regimen includes Toprol XL 50 mg by mouth daily.  Plan  We will continue the Toprol XL 50 mg by mouth daily and reassess the blood pressure at the follow-up visit.

## 2018-01-15 NOTE — Assessment & Plan Note (Signed)
Assessment  She denies any palpitations or chest pain.  She is adherent with her warfarin.  Plan  Her point-of-care INR today was 2.6.  Although this is generally considered to be therapeutic given the bleeding that she has had with her gastrointestinal AVMs our goal is between 1.5 and 2.  That said, she does not have clinical evidence of bleeding at this time.  On this dose of warfarin she usually is in the target range of 1.5-2.  We will therefore make no changes in her warfarin dosing at this time.  She will continue to follow closely in the anticoagulation clinic.

## 2018-01-15 NOTE — Assessment & Plan Note (Signed)
Assessment  Her dyspnea on exertion is stable on her current inhaler regimen which includes Advair Diskus 500-50 mcg 1 puff twice daily, tiotropium 1 inhalation daily, and albuterol 1 to 2 puffs every 6 hours as needed.  Her chronic cough likely associated with a postnasal drip from her allergic rhinitis but also possibly due to her smoking, is well controlled on Tessalon Perles 100 mg by mouth every 8 hours as needed.  She specifically would like a refill of this medication.  Plan  We will continue her inhaler regimen as outlined above as well as the Tessalon Perles as needed for her cough.  We will reassess her symptoms at the follow-up visit.  She is for the first time been in the contemplative stage of smoking cessation.  She has accepted nicotine patches and after the first of the year plans on making an attempt at smoking cessation.  She was praised for this given her underlying comorbidities.

## 2018-01-15 NOTE — Assessment & Plan Note (Addendum)
Assessment: Fall Patient had a mechanical fall this morning after exiting the passenger side of the vehicle. Patient is able to walk with the assistance of a cane. No red flag symptoms at this time.  Patient has a history of osteoporosis and with increased pain on internal, external and flexion of hip will obtain plain films of hip and pelvis to rule out fracture.    Plan -Right hip and pelvis 3 view x-ray -Ibuprofen 800 mg 3 times a day for 5 days -Omeprazole -Toradol injection in office -Phenergan in office -Short course of Zofran  Update:  Patient x-ray without acute abnormality. I discussed results with patient over the phone.

## 2018-01-15 NOTE — Progress Notes (Signed)
Internal Medicine Clinic Attending  Case discussed with Dr. Hoffman at the time of the visit.  We reviewed the resident's history and exam and pertinent patient test results.  I agree with the assessment, diagnosis, and plan of care documented in the resident's note.  

## 2018-01-15 NOTE — Assessment & Plan Note (Signed)
She is currently up-to-date on her healthcare maintenance.

## 2018-01-15 NOTE — Telephone Encounter (Signed)
Pt states she fell in driveway when she arrived home, fell on R hip, the pain is making her nauseous, feels dizzy, weak when she moves. Refuses ED. Pain is very bad. ACC at 1445

## 2018-01-15 NOTE — Assessment & Plan Note (Signed)
Assessment  For the first time since I have known her she is in the contemplative stage for smoking cessation.  She is willing to utilize nicotine patches to assist in her attempt to quit smoking.  She plans on starting the nicotine patches after the new year.  Plan  She was praised for smoking cessation and reminded that this was the best thing that she could do for her health.  She has the nicotine patches and will begin their use after the new year.  We will reassess the success in her attempts at smoking cessation at the follow-up visit.

## 2018-01-15 NOTE — Telephone Encounter (Signed)
Agree Thanks

## 2018-01-15 NOTE — Assessment & Plan Note (Signed)
Assessment  She previously saw gynecology for persistent irritation in the vulvovaginal area. The concern was that this did not represent recurrent candidiasis as it was no longer responding to the Diflucan. A diagnosis of lichen sclerosis of the female genitalia was made. Gynecology placed her on clobetasol cream 3.56% 1 application twice daily and triamcinolone cream 0.1% applied twice daily.  Plan  We will continue with the clobetasol cream at the current dose and reassess her symptoms at the follow-up visit.  A refill was placed at this time.

## 2018-01-22 ENCOUNTER — Telehealth: Payer: Self-pay | Admitting: Internal Medicine

## 2018-01-22 NOTE — Telephone Encounter (Signed)
   Reason for call:   I received a call from Ms. Dalina A Heupel at 1:50 AM indicating that she woke up from sleep and she is hurting everywhere, her pain is similar to her fibromyalgia pain.   Pertinent Data:   She has an history of fibromyalgia and take gabapentin 600 mg 3 times a day along with Vicodin as needed.  She took her Vicodin at 10 PM and went to bed, woke up around 1:30 AM with generalized myalgia and unable to sleep after that.  Patient denies any upper respiratory symptoms or recent sick contacts.  Patient denies any fever or chills.  No headaches, no nausea or vomiting, no chest pain or difficulty breathing.  Patient states that her pain is similar to her usual fibromyalgia pain.  She did took her evening dose of gabapentin.  She does not want to try ibuprofen stating that it hurts her stomach.   Assessment / Plan / Recommendations:   Advised patient to take 1 more Vicodin along with her Xanax as she has not taken her bedtime Xanax today.  As always, pt is advised that if symptoms worsen or new symptoms arise, they should go to an urgent care facility or to to ER for further evaluation.   Lorella Nimrod, MD   01/22/2018, 1:51 AM

## 2018-01-28 ENCOUNTER — Ambulatory Visit: Payer: PPO | Admitting: Pharmacist

## 2018-01-28 DIAGNOSIS — J449 Chronic obstructive pulmonary disease, unspecified: Secondary | ICD-10-CM | POA: Diagnosis not present

## 2018-01-28 DIAGNOSIS — I509 Heart failure, unspecified: Secondary | ICD-10-CM | POA: Diagnosis not present

## 2018-02-01 ENCOUNTER — Other Ambulatory Visit: Payer: Self-pay | Admitting: Internal Medicine

## 2018-02-01 DIAGNOSIS — I480 Paroxysmal atrial fibrillation: Secondary | ICD-10-CM

## 2018-02-01 DIAGNOSIS — R110 Nausea: Secondary | ICD-10-CM

## 2018-02-05 ENCOUNTER — Other Ambulatory Visit: Payer: Self-pay

## 2018-02-05 NOTE — Patient Outreach (Addendum)
[facility]) Care Management  02/05/2018  TERRIONA HORLACHER 10-28-1950 [id number]   Attempted to call member for monthly follow up call; however, no answer and HIPPA Compliant voice mail message left.   Member returned call and HIPPA identifiers completed. Introduced self to member. Member with last hospital admission from 11-13-2017 to 11-18-2017. Member stated she is doing well at this time.   Hypertension: Member stated she received a new electronic blood pressure monitor for Christmas and attempted to take blood pressure; however, unsure if cuff is for wrist or for upper arm. Asked member if she would like to schedule a home visit with this RNCM so that education could be provided to her concerning how to use new blood pressure machine and member declined. Member stated blood pressure was "170 over 70's". Member encouraged to document blood pressure in [facility] calendar and member verbalized agreement.  Depression: Member stated that she feels down and hopeless most all times; however, has no thoughts of harming self.  Labs: member stated there INR goal is 1.5-2.5 and last INR 2.6 and denies any concerns; weight: 187 lbs last week and denies concerns, follows up with coumadin clinic; FSBS 2 days ago was 477 and stated she rechecked FSBS within 10 minutes and FSBS was less than 200, today FSBS 150.  Transportation: Member denies any transportation concerns and stated that her sister assists with transportation.  Pain: Member states pain level "0" on scale 1-10 and stated that due to Fibromyalgia, she has body aches most times. Member stated she was prescribed Ibuprofen 2 weeks ago for pain s/p fall/R hip injury (PCP office visit done and xray taken); however, stopped taking Ibuprofen 2 days later and is currently taking Vicodin for pain and states Vicodin medication is effective for pain relief. Member states she does not know why she fell and stated sometimes when she stands, she  falls.  CHF: states weight loss and weight currently 187 lbs. Has scale and states she weighs self 1 x/week. Member educated and encouraged to weigh self daily and document in [facility] Calendar. Member verbalized agreement.  Goals: Member states she will call and schedule follow appointments as no current appointments are scheduled. Member states she plans to quit smoking when her "bag of tobacco runs out" and states she rolls up her cigarettes. Member stated; however, "that's no guarantee, I just might buy another bag, I get too much enjoyment from smoking".   Member, at this time denies any current needs from this [facility] and verbalized agreement with being transitioned to care received from [facility]   Will send referral to [facility] within next 1-2 business days for continued follow for education relating to Blood Pressure monitoring device; fall prevention; scheduling PCP follow up appointments; smoking cessation education. Will send Discipline Closure Letter to PCP.  Benjamine Mola "ANN" Josiah Lobo, RN-BSN  [facility] Care Management  Community Care Management Coordinator  [phone] [email or web address]

## 2018-02-08 NOTE — Addendum Note (Signed)
Addended by: Suzie Portela A on: 02/08/2018 10:49 AM   Modules accepted: Orders

## 2018-02-09 ENCOUNTER — Telehealth: Payer: Self-pay | Admitting: Internal Medicine

## 2018-02-09 NOTE — Telephone Encounter (Signed)
   Reason for call:   I received a call from [name] at 2145 hours PM indicating that she was feeling anxious.   Pertinent Data:   She stated that she had multiple issues that were concerning to her.  She was concerned about mild irritation of the left eye, no decreased vision, eye has baseline very poor vision, almost nonfunctional as per patient. She denied edema, erythema or other changes.   High blood pressure. She was gifted a sphygmomanometer for Christmas and noted that her blood pressure was elevated to 177/74. This was taken immediatly after walking while she was in her normal level of migraine type head pain.  She was concerned about her migraine headache. This was a normal headache for which she had not taken her regular medication for until 61mn prior to our return call.   IN addition, she was concerned that she had taken her Tuesday coumadin tablet on Monday due to the confusion with the holidays.    Assessment / Plan / Recommendations:   She agreed to see her ophthalmologist in the morning.   She was advised to retake her blood pressure after sitting x564m in a relaxed position after her headache resolved, which it had begun to do while on the phone. She was to return the call if her pressure continued to rise.  Her migraine was responding to her tylenol.  She will not take her Tuesday coumadin on 02/09/2018.  She was advised to call the clinic and be seen for her blood pressure this Thursday or Friday, to which she agreed.  As always, pt is advised that if symptoms worsen or new symptoms arise, they should go to an urgent care facility or to to ER for further evaluation.   Kathryn LudwigMD   02/09/2018, 12:03 AM

## 2018-02-12 ENCOUNTER — Telehealth: Payer: Self-pay | Admitting: *Deleted

## 2018-02-12 NOTE — Telephone Encounter (Signed)
Pt calls and states she is having h/a's daily now and the pain is bad enough to awake her, these h/a's are not like in the past when she had them these move from place to place on her head and fluctuate in severity. She is offered an [facility] appt, refuses, suggested ED, urg care refuses. She is cautioned that if she has N&V, vision changes, strength changes, speech changes, short of breath, chest pain to please call 911. She agrees. Next appt 02/19/2018

## 2018-02-15 ENCOUNTER — Telehealth: Payer: Self-pay | Admitting: Internal Medicine

## 2018-02-15 NOTE — Telephone Encounter (Signed)
Pt would like for you to call her please [phone]

## 2018-02-15 NOTE — Telephone Encounter (Signed)
I agree with recommendations that she be seen in [facility] given the severity of the headaches that wake her from sleep and her history of renal cell cancer.  Patient frequently refuses such offers to be seen in [facility].  Fortunately, I am scheduled to see her later this week and will address this issue with her in detail when she does present to clinic.  Thank You.

## 2018-02-17 ENCOUNTER — Other Ambulatory Visit: Payer: Self-pay | Admitting: Pharmacist

## 2018-02-19 ENCOUNTER — Other Ambulatory Visit: Payer: Self-pay

## 2018-02-19 ENCOUNTER — Observation Stay (HOSPITAL_COMMUNITY)
Admission: AD | Admit: 2018-02-19 | Discharge: 2018-02-19 | Disposition: A | Discharge status: Home / Self Care | Payer: PPO | Source: Ambulatory Visit | Attending: Internal Medicine | Admitting: Internal Medicine

## 2018-02-19 ENCOUNTER — Encounter (HOSPITAL_COMMUNITY): Payer: Self-pay | Admitting: General Practice

## 2018-02-19 ENCOUNTER — Ambulatory Visit (HOSPITAL_COMMUNITY): Payer: PPO

## 2018-02-19 ENCOUNTER — Ambulatory Visit (INDEPENDENT_AMBULATORY_CARE_PROVIDER_SITE_OTHER): Payer: PPO | Admitting: Internal Medicine

## 2018-02-19 ENCOUNTER — Observation Stay (HOSPITAL_COMMUNITY): Payer: PPO

## 2018-02-19 ENCOUNTER — Encounter: Payer: Self-pay | Admitting: Internal Medicine

## 2018-02-19 ENCOUNTER — Other Ambulatory Visit: Payer: Self-pay | Admitting: Internal Medicine

## 2018-02-19 VITALS — BP 154/57 | HR 73 | Temp 98.2°F | Wt 184.9 lb

## 2018-02-19 DIAGNOSIS — E1140 Type 2 diabetes mellitus with diabetic neuropathy, unspecified: Secondary | ICD-10-CM | POA: Diagnosis not present

## 2018-02-19 DIAGNOSIS — Z85528 Personal history of other malignant neoplasm of kidney: Secondary | ICD-10-CM

## 2018-02-19 DIAGNOSIS — G4733 Obstructive sleep apnea (adult) (pediatric): Secondary | ICD-10-CM

## 2018-02-19 DIAGNOSIS — Z720 Tobacco use: Secondary | ICD-10-CM

## 2018-02-19 DIAGNOSIS — I10 Essential (primary) hypertension: Secondary | ICD-10-CM

## 2018-02-19 DIAGNOSIS — Z794 Long term (current) use of insulin: Secondary | ICD-10-CM | POA: Diagnosis not present

## 2018-02-19 DIAGNOSIS — R51 Headache: Secondary | ICD-10-CM

## 2018-02-19 DIAGNOSIS — J449 Chronic obstructive pulmonary disease, unspecified: Secondary | ICD-10-CM

## 2018-02-19 DIAGNOSIS — I110 Hypertensive heart disease with heart failure: Secondary | ICD-10-CM

## 2018-02-19 DIAGNOSIS — Z885 Allergy status to narcotic agent status: Secondary | ICD-10-CM

## 2018-02-19 DIAGNOSIS — G8929 Other chronic pain: Secondary | ICD-10-CM

## 2018-02-19 DIAGNOSIS — I480 Paroxysmal atrial fibrillation: Secondary | ICD-10-CM | POA: Diagnosis not present

## 2018-02-19 DIAGNOSIS — Z9989 Dependence on other enabling machines and devices: Secondary | ICD-10-CM

## 2018-02-19 DIAGNOSIS — K5520 Angiodysplasia of colon without hemorrhage: Secondary | ICD-10-CM

## 2018-02-19 DIAGNOSIS — M549 Dorsalgia, unspecified: Secondary | ICD-10-CM

## 2018-02-19 DIAGNOSIS — G44219 Episodic tension-type headache, not intractable: Secondary | ICD-10-CM

## 2018-02-19 DIAGNOSIS — F419 Anxiety disorder, unspecified: Secondary | ICD-10-CM

## 2018-02-19 DIAGNOSIS — Z9981 Dependence on supplemental oxygen: Secondary | ICD-10-CM

## 2018-02-19 DIAGNOSIS — R5383 Other fatigue: Secondary | ICD-10-CM | POA: Diagnosis not present

## 2018-02-19 DIAGNOSIS — E876 Hypokalemia: Secondary | ICD-10-CM

## 2018-02-19 DIAGNOSIS — Z993 Dependence on wheelchair: Secondary | ICD-10-CM

## 2018-02-19 DIAGNOSIS — I5030 Unspecified diastolic (congestive) heart failure: Secondary | ICD-10-CM | POA: Diagnosis not present

## 2018-02-19 DIAGNOSIS — F17210 Nicotine dependence, cigarettes, uncomplicated: Secondary | ICD-10-CM | POA: Diagnosis not present

## 2018-02-19 DIAGNOSIS — G44209 Tension-type headache, unspecified, not intractable: Principal | ICD-10-CM | POA: Insufficient documentation

## 2018-02-19 DIAGNOSIS — Z79891 Long term (current) use of opiate analgesic: Secondary | ICD-10-CM

## 2018-02-19 DIAGNOSIS — Z7901 Long term (current) use of anticoagulants: Secondary | ICD-10-CM

## 2018-02-19 DIAGNOSIS — E669 Obesity, unspecified: Secondary | ICD-10-CM

## 2018-02-19 DIAGNOSIS — E785 Hyperlipidemia, unspecified: Secondary | ICD-10-CM

## 2018-02-19 DIAGNOSIS — Q2733 Arteriovenous malformation of digestive system vessel: Secondary | ICD-10-CM

## 2018-02-19 DIAGNOSIS — F329 Major depressive disorder, single episode, unspecified: Secondary | ICD-10-CM

## 2018-02-19 DIAGNOSIS — D500 Iron deficiency anemia secondary to blood loss (chronic): Secondary | ICD-10-CM

## 2018-02-19 DIAGNOSIS — Z79899 Other long term (current) drug therapy: Secondary | ICD-10-CM

## 2018-02-19 DIAGNOSIS — Z9181 History of falling: Secondary | ICD-10-CM

## 2018-02-19 HISTORY — DX: Headache: R51

## 2018-02-19 LAB — CBC
HCT: 37.3 % (ref 36.0–46.0)
Hemoglobin: 11.2 g/dL — ABNORMAL LOW (ref 12.0–15.0)
MCH: 27.4 pg (ref 26.0–34.0)
MCHC: 30 g/dL (ref 30.0–36.0)
MCV: 91.2 fL (ref 80.0–100.0)
Platelets: 232 10*3/uL (ref 150–400)
RBC: 4.09 MIL/uL (ref 3.87–5.11)
RDW: 15.6 % — ABNORMAL HIGH (ref 11.5–15.5)
WBC: 9.2 10*3/uL (ref 4.0–10.5)
nRBC: 0 % (ref 0.0–0.2)

## 2018-02-19 LAB — POCT GLYCOSYLATED HEMOGLOBIN (HGB A1C): Hemoglobin A1C: 6.3 % — AB (ref 4.0–5.6)

## 2018-02-19 LAB — BASIC METABOLIC PANEL
Anion gap: 8 (ref 5–15)
BUN: 8 mg/dL (ref 8–23)
CO2: 27 mmol/L (ref 22–32)
Calcium: 9.1 mg/dL (ref 8.9–10.3)
Chloride: 102 mmol/L (ref 98–111)
Creatinine, Ser: 0.85 mg/dL (ref 0.44–1.00)
GFR calc Af Amer: >60 mL/min (ref >60)
GFR calc non Af Amer: >60 mL/min (ref >60)
Glucose, Bld: 110 mg/dL — ABNORMAL HIGH (ref 70–99)
Potassium: 3.3 mmol/L — ABNORMAL LOW (ref 3.5–5.1)
Sodium: 137 mmol/L (ref 135–145)

## 2018-02-19 LAB — POCT INR: INR: 2.4 (ref 2.0–3.0)

## 2018-02-19 LAB — GLUCOSE, CAPILLARY
Glucose-Capillary: 186 mg/dL — ABNORMAL HIGH (ref 70–99)
Glucose-Capillary: 85 mg/dL (ref 70–99)
Glucose-Capillary: 170 mg/dL — ABNORMAL HIGH (ref 70–99)

## 2018-02-19 MED ORDER — BUPROPION HCL 75 MG PO TABS: 75.0000 mg | ORAL_TABLET | Freq: Two times a day (BID) | ORAL | Status: DC

## 2018-02-19 MED ORDER — ALPRAZOLAM 0.5 MG PO TABS: 1.0000 mg | ORAL_TABLET | Freq: Every day | ORAL | Status: DC

## 2018-02-19 MED ORDER — NICOTINE 14 MG/24HR TD PT24: 14.0000 mg | MEDICATED_PATCH | Freq: Every day | TRANSDERMAL | Status: DC

## 2018-02-19 MED ORDER — SENNOSIDES-DOCUSATE SODIUM 8.6-50 MG PO TABS: 1.0000 | ORAL_TABLET | Freq: Every evening | ORAL | Status: DC | PRN

## 2018-02-19 MED ORDER — GABAPENTIN 300 MG PO CAPS: 900.0000 mg | ORAL_CAPSULE | Freq: Two times a day (BID) | ORAL | Status: DC

## 2018-02-19 MED ORDER — ALPRAZOLAM 0.5 MG PO TABS: 0.5000 mg | ORAL_TABLET | Freq: Two times a day (BID) | ORAL | Status: DC | PRN

## 2018-02-19 MED ORDER — ACETAMINOPHEN 325 MG PO TABS: 650.0000 mg | ORAL_TABLET | Freq: Four times a day (QID) | ORAL | Status: DC | PRN

## 2018-02-19 MED ORDER — INSULIN ASPART 100 UNIT/ML ~~LOC~~ SOLN: 0.0000 [IU] | Freq: Every day | SUBCUTANEOUS | Status: DC

## 2018-02-19 MED ORDER — IPRATROPIUM-ALBUTEROL 0.5-2.5 (3) MG/3ML IN SOLN: 3.0000 mL | Freq: Four times a day (QID) | RESPIRATORY_TRACT | Status: DC | PRN

## 2018-02-19 MED ORDER — ALBUTEROL SULFATE HFA 108 (90 BASE) MCG/ACT IN AERS: 2.0000 | INHALATION_SPRAY | Freq: Four times a day (QID) | RESPIRATORY_TRACT | Status: DC | PRN

## 2018-02-19 MED ORDER — POTASSIUM CHLORIDE CRYS ER 20 MEQ PO TBCR: 20.0000 meq | EXTENDED_RELEASE_TABLET | Freq: Two times a day (BID) | ORAL | Status: DC

## 2018-02-19 MED ORDER — METOPROLOL SUCCINATE ER 25 MG PO TB24: 50.0000 mg | ORAL_TABLET | Freq: Every day | ORAL | Status: DC

## 2018-02-19 MED ORDER — DIPHENHYDRAMINE HCL 50 MG/ML IJ SOLN
12.5000 mg | Freq: Once | INTRAMUSCULAR | Status: AC
  Administered 2018-02-19: 12.5 mg via INTRAVENOUS
  Filled 2018-02-19: qty 1

## 2018-02-19 MED ORDER — ROSUVASTATIN CALCIUM 20 MG PO TABS: 20.0000 mg | ORAL_TABLET | Freq: Every day | ORAL | Status: DC

## 2018-02-19 MED ORDER — FUROSEMIDE 80 MG PO TABS: 80.0000 mg | ORAL_TABLET | Freq: Every day | ORAL | Status: DC

## 2018-02-19 MED ORDER — ACETAMINOPHEN 650 MG RE SUPP: 650.0000 mg | Freq: Four times a day (QID) | RECTAL | Status: DC | PRN

## 2018-02-19 MED ORDER — TIOTROPIUM BROMIDE MONOHYDRATE 18 MCG IN CAPS: 18.0000 ug | ORAL_CAPSULE | Freq: Every day | RESPIRATORY_TRACT | Status: DC

## 2018-02-19 MED ORDER — DULOXETINE HCL 60 MG PO CPEP: 60.0000 mg | ORAL_CAPSULE | Freq: Two times a day (BID) | ORAL | Status: DC

## 2018-02-19 MED ORDER — KETOROLAC TROMETHAMINE 30 MG/ML IJ SOLN
60.0000 mg | Freq: Once | INTRAMUSCULAR | Status: AC
  Administered 2018-02-19: 60 mg via INTRAMUSCULAR

## 2018-02-19 MED ORDER — FERROUS GLUCONATE 324 (38 FE) MG PO TABS: 324.0000 mg | ORAL_TABLET | Freq: Two times a day (BID) | ORAL | Status: DC

## 2018-02-19 MED ORDER — WARFARIN SODIUM 2.5 MG PO TABS: 2.5000 mg | ORAL_TABLET | Freq: Every day | ORAL | Status: DC

## 2018-02-19 MED ORDER — INSULIN ASPART 100 UNIT/ML ~~LOC~~ SOLN: 4.0000 [IU] | Freq: Three times a day (TID) | SUBCUTANEOUS | Status: DC

## 2018-02-19 MED ORDER — PROCHLORPERAZINE EDISYLATE 10 MG/2ML IJ SOLN
10.0000 mg | Freq: Once | INTRAMUSCULAR | Status: AC
  Administered 2018-02-19: 10 mg via INTRAVENOUS
  Filled 2018-02-19: qty 2

## 2018-02-19 MED ORDER — BENZONATATE 100 MG PO CAPS: 100.0000 mg | ORAL_CAPSULE | Freq: Three times a day (TID) | ORAL | Status: DC | PRN

## 2018-02-19 MED ORDER — INSULIN ASPART 100 UNIT/ML ~~LOC~~ SOLN: 0.0000 [IU] | Freq: Three times a day (TID) | SUBCUTANEOUS | Status: DC

## 2018-02-19 MED ORDER — HYDROCODONE-ACETAMINOPHEN 5-325 MG PO TABS: 1.0000 | ORAL_TABLET | Freq: Four times a day (QID) | ORAL | Status: DC | PRN

## 2018-02-19 NOTE — Progress Notes (Signed)
Subjective:    Patient ID: [name], female    DOB: Jan 31, 1951, 68 y.o.   MRN: [id number]  HPI  [name] is here for an acute visit to address her new onset headaches. In addition, we also addressed her diabetes, essential hypertension, obstructive sleep apnea, and tobacco abuse.  Please see the A&P for the status of the pt's chronic medical problems.  With regards to her headaches, she is unsure when they started, but it has been recently.  She has also started two new medications recently, Victoza and Tessalon Perles, but is unsure if their initiation was temporally related to the onset of these headaches.  Her headaches are described as non-pounding aches that can occur anywhere in the head.  They are not associated with position, activity, coughing, photophobia or phonophobia.  They can last for more than several hours to as little as a few hours.  They are not associated with any weakness, numbness, or clumsiness.  She has nnot had any recent falls resulting in hitting of her head.  She has a remote history of migraines, but these are not similar.  She is on warfarin and her INR today was 2.4.  She also denies any fevers, shakes, chills, or sinus complaints.  She states she has been compliant with her nocturnal CPAP.  She had called earlier this week given the severity of the headaches put refused to go to the ED for further evaluation and instead scheduled this appointment.  Her only other acute complaint is increasing generalized fatigue, similar to previous episodes of symptomatic anemia.  She denies any abdominal pain, change in her stools, melanic stools, hematochezia, hematuria, or other obvious sources of bleeding.  She does admit to once again craving ice.  She states she has been compliant with her iron supplementation.  She is w/o other acute complaints.  Review of Systems  Constitutional: Positive for fatigue. Negative for activity change, appetite change, chills and  fever.  HENT: Negative for congestion, nosebleeds, postnasal drip, rhinorrhea, sinus pressure and sinus pain.   Respiratory: Positive for shortness of breath. Negative for cough, chest tightness and wheezing.   Cardiovascular: Negative for chest pain, palpitations and leg swelling.  Gastrointestinal: Negative for abdominal distention, abdominal pain, anal bleeding, blood in stool, constipation, diarrhea, nausea and vomiting.  Genitourinary: Negative for difficulty urinating and hematuria.  Musculoskeletal: Positive for arthralgias and back pain. Negative for joint swelling and myalgias.  Neurological: Positive for headaches. Negative for dizziness, seizures, syncope, facial asymmetry, speech difficulty, weakness and numbness.  Hematological: Bruises/bleeds easily.  Psychiatric/Behavioral: Positive for dysphoric mood and sleep disturbance. The patient is nervous/anxious.       Objective:   Physical Exam Vitals signs and nursing note reviewed.  Constitutional:      General: She is not in acute distress.    Appearance: She is well-developed. She is obese. She is not ill-appearing, toxic-appearing or diaphoretic.  HENT:     Head: Normocephalic and atraumatic.     Mouth/Throat:     Mouth: Mucous membranes are moist.     Pharynx: Oropharynx is clear.  Eyes:     General: No scleral icterus.    Extraocular Movements: Extraocular movements intact.     Right eye: Normal extraocular motion and no nystagmus.     Left eye: Normal extraocular motion and no nystagmus.     Pupils: Pupils are equal.     Right eye: Pupil is not round. Pupil is reactive.  Left eye: Pupil is not round. Pupil is reactive.   Neck:     Musculoskeletal: Normal range of motion and neck supple. No neck rigidity.  Cardiovascular:     Rate and Rhythm: Normal rate and regular rhythm.     Heart sounds: Normal heart sounds. No murmur. No friction rub. No gallop.   Pulmonary:     Effort: Pulmonary effort is normal. No  respiratory distress.     Breath sounds: No stridor. Rhonchi present. No wheezing or rales.  Abdominal:     General: Bowel sounds are normal. There is no distension.     Palpations: Abdomen is soft.     Tenderness: There is no abdominal tenderness. There is no guarding or rebound.  Musculoskeletal: Normal range of motion.        General: No swelling, tenderness, deformity or signs of injury.     Right lower leg: No edema.     Left lower leg: No edema.  Skin:    General: Skin is warm and dry.     Coloration: Skin is not jaundiced or pale.     Findings: Bruising present. No rash.     Comments: Left upper arm bruse  Neurological:     General: No focal deficit present.     Mental Status: She is alert and oriented to person, place, and time. Mental status is at baseline.     Cranial Nerves: Cranial nerves are intact. No cranial nerve deficit, dysarthria or facial asymmetry.     Sensory: Sensation is intact. No sensory deficit.     Motor: Motor function is intact. No weakness, tremor, abnormal muscle tone or seizure activity.     Deep Tendon Reflexes: Reflexes are normal and symmetric. Reflexes normal.     Reflex Scores:      Bicep reflexes are 2+ on the right side and 2+ on the left side.      Brachioradialis reflexes are 2+ on the right side and 2+ on the left side.      Patellar reflexes are 2+ on the right side and 2+ on the left side.      Achilles reflexes are 2+ on the right side and 2+ on the left side. Psychiatric:        Mood and Affect: Mood normal.        Behavior: Behavior normal.        Thought Content: Thought content normal.        Judgment: Judgment normal.       Assessment & Plan:    Please see problem oriented charting.

## 2018-02-19 NOTE — H&P (Signed)
Date: 02/19/2018               Patient Name:  [name] MRN: [id number]  DOB: 03-11-1950 Age / Sex: 68 y.o., female   PCP: Kathryn Linsey, MD         Medical Service: Internal Medicine Teaching Service         Attending Physician: Dr. Lucious Groves, DO    First Contact: Dr. Alfonse Horn Pager: [phone]  Second Contact: Dr. Frederico Horn Pager: [phone]       After Hours (After 5p/  First Contact Pager: [phone]  weekends / holidays): Second Contact Pager: [phone]   Chief Complaint: Headache  History of Present Illness:  [name] is a 68 year-old female with history significant for COPD ,  paroxysmal afib on Warfarin, gastrointestinal AVMs, chronic iron deficiency anemia secondary to GI blood loss, OSA on CPAP, obesity, tobacco abuse, diabetes, and history of renal cell carcinoma who presented to the Internal Medicine Clinic for acute onset headaches and evaluated by her PCP this morning.  Due to the nature of her headaches patient needed further imaging and was consulted for admission. Patient states she cannot tell me exactly when her headaches started but thinks they were several weeks ago. She describes the headache as a constant pain lasting several hours, pressure like and dull that encompasses the entire head.  At times they will migrate to different areas of the head.  They are not worsened with light or loud noises. They have progressed to occurring daily.  She has a remote history of migraines but states these headaches are different.  She reports adherence to her CPAP machine nightly.  She has associated symptoms of nausea and fatigue.  She denies vomiting, vision changes, fever chills, weakness/ numbness, or neck stiffness. She denies falls or trauma to her head.    Meds: furosemide 80 mg daily, metoprolol succinate 50 mg daily, rosuvastatin 20 mg daily, Xanax 1 mg at nighttime, bupropion 75 mg twice a day, duloxetine 60 mg twice a day, nicotine patch,NovoLog, ferrous  gluconate 324 mg 2 times a day, warfarin 2.5 mg daily, gabapentin 900 mg twice a day, albuterol, Spiriva, DuoNeb. Current Facility-Administered Medications for the 02/19/18 encounter [facility] Encounter)  Medication  . senna-docusate (Senokot-S) tablet 1 tablet   No outpatient medications have been marked as taking for the 02/19/18 encounter [facility] Encounter).     Allergies: Allergies as of 02/19/2018 - Review Complete 01/15/2018  Allergen Reaction Noted  . Lorazepam Other (See Comments) 11/04/2010  . Morphine and related Other (See Comments) 06/27/2013  . Oxycontin [oxycodone] Other (See Comments) 05/03/2012  . Tramadol hcl Swelling    Past Medical History:  Diagnosis Date  . Adenomatous polyps 05/14/2011   Colonoscopy (05/2011): 4 mm adenomatous polyp excised endoscopically Colonoscopy (02/2002): Adenomatous polyp excised endoscopically   . Allergic rhinitis 06/01/2012  . Anemia of chronic disease 01/01/2013  . Anxiety 07/24/2010  . Aortic atherosclerosis ([place]) 10/19/2014   Seen on CT scan, currently asymptomatic  . 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 Arthritis    "lower back" (11/13/2017)  . Asymptomatic cholelithiasis 09/25/2015   Seen on CT scan 08/2015  . Carotid artery stenosis    s/p right endarterectomy (06/2010) Carotid US (07/2010):  Left: Moderate-to-severe (60-79%) calcific and non-calcific plaque origin and proximal ICA and ECA   . Chronic congestive heart failure with left ventricular diastolic dysfunction ([place]) 10/21/2010  .  Chronic constipation 02/03/2011  . Chronic daily headache 01/16/2014  . Chronic headache    "weekly now" (11/13/2017)  . Chronic low back pain 10/06/2012  . Chronic venous insufficiency 08/04/2012  . Clear cell renal cell carcinoma ([place]) 07/21/2011   s/p cryoablation of left RCC in 09/2011 by [name]. Followed by [name]  [facility]) .    Marland Kitchen  COPD (chronic obstructive pulmonary disease) with emphysema (HCC)    PFTs 02/2012: FEV1 0.92 (40%), ratio 69, 27% increase in FEV1 with BD, TLC 91%, severe airtrapping, DLCO49% On chronic home O2. Pulmonary rehab referral 05/2012   . Depression 11/19/2005  . Esophageal stenosis 08/08/2015   Mild, benign-appearing on EGD 06/29/2015  . Fibromyalgia 08/29/2010  . Gastroesophageal reflux disease   . History of blood transfusion    "several times"   . History of hiatal hernia   . History of kidney stones   . Hyperlipidemia LDL goal < 100 11/20/2005  . Internal hemorrhoids 08/04/2012  . Lichen sclerosus of female genitalia 01/12/2017  . Mitral stenosis    s/p Mitral valve replacement with a 27-mm pericardial porcine valve (Medtronic Mosaic valve, serial #[id number] on 09/20/10, Dr. Prescott Gum)   . Moderate to severe pulmonary hypertension ([place])    2014 TEE w PA peak pressure 46 mmHg, s/p MV replacement   . Moderately severe major depression ([place]) 11/19/2005  . Obesity (BMI 30.0-34.9) 10/23/2011  . Obstructive sleep apnea    Nocturnal polysomnography (06/2009): Moderate sleep apnea/ hypopnea syndrome , AHI 17.8 per hour with nonpositional hypopneas. CPAP titration to 12 CWP, AHI 2.4 per hour. On nocturnal CPAP via a small resMed Quattro full-face mask with heated humidifier.   . On home oxygen therapy    "2.5L; anytime I sleep; day or night" (11/13/2017)  . OSA on CPAP    "w/my oxygen" (11/13/2017)  . Osteoporosis    DEXA (12/09/2011): L-spine T -3.7, left hip T -1.4 DEXA (12/2004): L-spine T -2.6, left hip -0.1   . Paroxysmal atrial fibrillation ([place]) 10/22/2010   s/p Left atrial maze procedure for paroxysmal atrial fibrillation on 09/20/2010 by Dr Prescott Gum.  Subsequent splenic infarct, decision was made to re-anticoagulate with coumadin, likely life-long as this is the most likely cause of the splenic infarct.   . Pneumonia    "once" (11/13/2017)  . Pulmonary hypertension due to chronic obstructive  pulmonary disease ([place]) 04/25/2016  . Right nephrolithiasis 09/06/2014   5 mm non-obstructing calculus seen on CT scan 09/05/2014   . Seborrheic keratosis 09/28/2015  . Severe obesity (BMI 35.0-39.9) with comorbidity ([place]) 10/23/2011  . Shortness of breath dyspnea   . Tobacco abuse 07/28/2012  . Type 2 diabetes mellitus with diabetic neuropathy (HCC)     Family History:  Family History  Problem Relation Age of Onset  . Peptic Ulcer Disease Father   . Heart attack Father 1       Died of MI at age 8  . Heart attack Brother 30       Died of MI at age 51  . Obesity Brother   . Pneumonia Mother   . Healthy Sister   . Lupus Daughter   . Obsessive Compulsive Disorder Daughter      Social History: tobacco use: Smokes one pack of cigarettes per day, 55 year pack smoking history Alcohol use: Denies Illicit drug use: Denies  Review of Systems: A complete ROS was negative except as per HPI.   Physical Exam: There were no vitals taken for  this visit. Physical Exam  Constitutional: She is well-developed, well-nourished, and in no distress.  HENT:  Head: Normocephalic and atraumatic.  Eyes: Pupils are equal, round, and reactive to light. Conjunctivae and EOM are normal.  Neck: Normal range of motion. Neck supple.  Cardiovascular: Normal rate, regular rhythm and normal heart sounds. Exam reveals no gallop and no friction rub.  No murmur heard. Pulmonary/Chest: Effort normal and breath sounds normal. No respiratory distress. She has no wheezes. She has no rales.  Musculoskeletal:        General: No edema.  Neurological: No cranial nerve deficit.  Skin: Skin is warm and dry.    Assessment & Plan by Problem: Active Problems:   Acute headache  Acute headache Neuro exam without focal deficits.  However, patient has new onset headaches with concerning features and will need initial imaging of CT head to rule out bleed in the setting of anticoagulation use.  If negative can be discharged  with follow up with PCP to obtain Head MRI as she has a history of renal cell carcinoma.  Patient states that Toradol injection in the clinic did not help with headache. Will try migraine cocktail. -CT head wo contrast -migraine cocktail  History of chronic iron deficiency Reports increased generalized fatigue over the past month.  CBC today showed  hemoglobin of 11.  -continue ferrous gluconate  Hypokalemia K 3.3 -repleating  Atrial fibrillation on anticoagulation Patient takes warfarin and INR today was 2.4 -Continue home warfarin  Diastolic heart failure - Continue home lasix 105m   HTN - Continue home metoprolol 25 mg daily  HLD  - Continue home crestor 222mdaily  DMII Home regimen includes novolog 15u QID and Victoza.  - CBG QID - Novolog w/meals - SSI, moderate  Chronic back pain - Continue home Norco 2 tablets q6hrs PRN and gabapentin 90036mID  OSA - CPAP at night  Tobacco use - Nicotine patch  Anxiety and depression - Continue home xanax 0.5mg51mD PRN, 1mg 33m, buproprion 75mg 42m and duloxetine 60 mg BID  COPD -continue home spiriva, duoneb prn, albuterol prn  Dispo: Admit patient to Observation with expected length of stay less than 2 midnights.  Signed: HoffmaValinda Party/11/2018, 2:15 PM  Pager: [phone]

## 2018-02-19 NOTE — Addendum Note (Signed)
Addended by: [name] D on: 02/19/2018 04:45 PM   Modules accepted: Orders

## 2018-02-19 NOTE — Progress Notes (Signed)
Anticoagulation Management Kathryn A Elkholyis a 68 y.o.femalewho reports to the clinic for monitoring of warfarintreatment.  Indication: atrial fibrillation (also history of GI bleed with AVM) Duration:indefinite Supervising physician:Lawrence Klima  Anticoagulation Clinic Visit History: Patientdoes notreport signs/symptoms of bleeding or thromboembolism  Anticoagulation Episode Summary    Current INR goal:   1.5-2.5  TTR:   81.8 % (4.7 y)  Next INR check:   03/19/2018  INR from last check:   2.4 (02/19/2018)  Weekly max warfarin dose:     Target end date:     INR check location:   Anticoagulation Clinic  Preferred lab:     Send INR reminders to:   [facility]   Indications   PAF (paroxysmal atrial fibrillation) ([place]) [I48.0] Long term current use of anticoagulant therapy [Z79.01]       Comments:         Anticoagulation Care Providers    Provider Role Specialty Phone number   Lelon Perla, MD  Cardiology [phone]      Allergies  Allergen Reactions  . Lorazepam Other (See Comments)    Patient's sister noted that ativan caused the patient to become extremely confused during hospitalization 09/2010; tolerates Xanax  . Morphine And Related Other (See Comments)    Injection site reaction  . Oxycontin [Oxycodone] Other (See Comments)    headache  . Tramadol Hcl Swelling    Ankle swelling   Medication Sig  albuterol (PROAIR HFA) 108 (90 Base) MCG/ACT inhaler Inhale 2 puffs into the lungs every 6 (six) hours as needed for shortness of breath.  ALPRAZolam (XANAX) 1 MG tablet Take 1 tablet (1 mg) by mouth at bedtime as needed for insomnia.  May take 1/2 tablet (0.5 mg) by mouth during the day for anxiety.  benzonatate (TESSALON) 100 MG capsule Take 1 capsule (100 mg total) by mouth 3 (three) times daily as needed for cough.  Blood Glucose Monitoring Suppl (ONETOUCH VERIO) W/DEVICE KIT 1 each by Does not apply route 4 (four) times daily.   buPROPion (WELLBUTRIN) 75 MG tablet Take 1 tablet (75 mg total) by mouth 2 (two) times daily.  chlorpheniramine (CHLOR-TRIMETON) 4 MG tablet Take 4 mg by mouth daily as needed for allergies.  clobetasol cream (TEMOVATE) 8.83 % Apply 1 application topically 2 (two) times daily as needed (lichen sclerosus flare).  DULoxetine (CYMBALTA) 60 MG capsule Take 1 capsule (60 mg total) by mouth 2 (two) times daily.  ferrous gluconate (FERGON) 324 MG tablet Take 1 tablet (324 mg total) by mouth 2 (two) times daily with a meal. Patient taking differently: Take 648 mg by mouth See admin instructions. Take 2 tablets (648 mg) by mouth daily with lunch or supper  fluticasone (FLONASE) 50 MCG/ACT nasal spray Place 2 sprays into both nostrils daily.  Fluticasone-Salmeterol (ADVAIR DISKUS) 500-50 MCG/DOSE AEPB Inhale 1 puff into the lungs 2 (two) times daily.  furosemide (LASIX) 80 MG tablet Take 1 tablet (80 mg total) by mouth daily.  gabapentin (NEURONTIN) 300 MG capsule Take 2 capsules (600 mg total) by mouth 3 (three) times daily. Patient taking differently: Take 900 mg by mouth 2 (two) times daily.   glucose blood (ONETOUCH VERIO) test strip Use to check blood sugar 4 times daily. diag code E11.40. Insulin dependent  HYDROcodone-acetaminophen (NORCO/VICODIN) 5-325 MG tablet Take 1-2 tablets by mouth every 6 (six) hours as needed for severe pain.  ibuprofen (ADVIL,MOTRIN) 800 MG tablet Take 1 tablet (800 mg total) by mouth every 8 (eight)  hours as needed. Patient not taking: Reported on 02/05/2018  insulin aspart (NOVOLOG FLEXPEN) 100 UNIT/ML FlexPen Inject 15 Units into the skin 4 (four) times daily -  before meals and at bedtime.  Insulin Pen Needle 32G X 4 MM MISC Use to inject insulin up to 6 times a day  Insulin Syringe-Needle U-100 31G X 15/64" 0.5 ML MISC Use to inject insulin up to 4 times a day  ipratropium-albuterol (DUONEB) 0.5-2.5 (3) MG/3ML SOLN Take 3 mLs by nebulization every 6 (six) hours as  needed.  Lancets Misc. (ACCU-CHEK FASTCLIX LANCET) KIT Check your blood 4 times a day dx code 250.00 insulin requiring  liraglutide (VICTOZA) 18 MG/3ML SOPN Inject 0.3 mLs (1.8 mg total) into the skin daily.  metoprolol succinate (TOPROL-XL) 50 MG 24 hr tablet Take 1 tablet (50 mg total) by mouth daily.  nicotine (NICODERM CQ - DOSED IN MG/24 HOURS) 14 mg/24hr patch Place 1 patch (14 mg total) onto the skin daily. Patient not taking: Reported on 02/05/2018  nystatin cream (MYCOSTATIN) Apply 1 application topically 2 (two) times daily as needed (rash).   omeprazole (PRILOSEC) 40 MG capsule Take 1 capsule (40 mg total) by mouth 2 (two) times daily.  ondansetron (ZOFRAN) 4 MG tablet Take 1 tablet (4 mg total) by mouth every 8 (eight) hours as needed for nausea or vomiting.  OXYGEN Inhale 2 L into the lungs as needed (for shortness of breath while active).   potassium chloride SA (K-DUR,KLOR-CON) 20 MEQ tablet Take 1 tablet (20 mEq total) by mouth 2 (two) times daily.  PRESCRIPTION MEDICATION Inhale into the lungs at bedtime. CPAP  rosuvastatin (CRESTOR) 20 MG tablet Take 1 tablet (20 mg total) by mouth at bedtime.  tiotropium (SPIRIVA HANDIHALER) 18 MCG inhalation capsule Place 1 capsule (18 mcg total) into inhaler and inhale daily.  warfarin (COUMADIN) 2.5 MG tablet TAKE 1 TABLET BY MOUTH DAILY AT 6 PM EXCEPT ON MONDAYS   Past Medical History:  Diagnosis Date  . Adenomatous polyps 05/14/2011   Colonoscopy (05/2011): 4 mm adenomatous polyp excised endoscopically Colonoscopy (02/2002): Adenomatous polyp excised endoscopically   . Allergic rhinitis 06/01/2012  . Anemia of chronic disease 01/01/2013  . Anxiety 07/24/2010  . Aortic atherosclerosis ([place]) 10/19/2014   Seen on CT scan, currently asymptomatic  . 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 Arthritis    "lower back" (11/13/2017)  . Asymptomatic cholelithiasis  09/25/2015   Seen on CT scan 08/2015  . Carotid artery stenosis    s/p right endarterectomy (06/2010) Carotid US (07/2010):  Left: Moderate-to-severe (60-79%) calcific and non-calcific plaque origin and proximal ICA and ECA   . Chronic congestive heart failure with left ventricular diastolic dysfunction ([place]) 10/21/2010  . Chronic constipation 02/03/2011  . Chronic daily headache 01/16/2014  . Chronic headache    "weekly now" (11/13/2017)  . Chronic low back pain 10/06/2012  . Chronic venous insufficiency 08/04/2012  . Clear cell renal cell carcinoma ([place]) 07/21/2011   s/p cryoablation of left RCC in 09/2011 by [name]. Followed by [name]  [facility]) .    Marland Kitchen COPD (chronic obstructive pulmonary disease) with emphysema (HCC)    PFTs 02/2012: FEV1 0.92 (40%), ratio 69, 27% increase in FEV1 with BD, TLC 91%, severe airtrapping, DLCO49% On chronic home O2. Pulmonary rehab referral 05/2012   . Depression 11/19/2005  . Esophageal stenosis 08/08/2015   Mild, benign-appearing on EGD 06/29/2015  . Fibromyalgia 08/29/2010  .  Gastroesophageal reflux disease   . History of blood transfusion    "several times"   . History of hiatal hernia   . History of kidney stones   . Hyperlipidemia LDL goal < 100 11/20/2005  . Internal hemorrhoids 08/04/2012  . Lichen sclerosus of female genitalia 01/12/2017  . Mitral stenosis    s/p Mitral valve replacement with a 27-mm pericardial porcine valve (Medtronic Mosaic valve, serial #[id number] on 09/20/10, Dr. Prescott Gum)   . Moderate to severe pulmonary hypertension ([place])    2014 TEE w PA peak pressure 46 mmHg, s/p MV replacement   . Moderately severe major depression ([place]) 11/19/2005  . Obesity (BMI 30.0-34.9) 10/23/2011  . Obstructive sleep apnea    Nocturnal polysomnography (06/2009): Moderate sleep apnea/ hypopnea syndrome , AHI 17.8 per hour with nonpositional hypopneas. CPAP titration to 12 CWP, AHI 2.4 per hour. On nocturnal CPAP via a small resMed  Quattro full-face mask with heated humidifier.   . On home oxygen therapy    "2.5L; anytime I sleep; day or night" (11/13/2017)  . OSA on CPAP    "w/my oxygen" (11/13/2017)  . Osteoporosis    DEXA (12/09/2011): L-spine T -3.7, left hip T -1.4 DEXA (12/2004): L-spine T -2.6, left hip -0.1   . Paroxysmal atrial fibrillation ([place]) 10/22/2010   s/p Left atrial maze procedure for paroxysmal atrial fibrillation on 09/20/2010 by Dr Prescott Gum.  Subsequent splenic infarct, decision was made to re-anticoagulate with coumadin, likely life-long as this is the most likely cause of the splenic infarct.   . Pneumonia    "once" (11/13/2017)  . Pulmonary hypertension due to chronic obstructive pulmonary disease ([place]) 04/25/2016  . Right nephrolithiasis 09/06/2014   5 mm non-obstructing calculus seen on CT scan 09/05/2014   . Seborrheic keratosis 09/28/2015  . Severe obesity (BMI 35.0-39.9) with comorbidity ([place]) 10/23/2011  . Shortness of breath dyspnea   . Tobacco abuse 07/28/2012  . Type 2 diabetes mellitus with diabetic neuropathy (HCC)    Social History   Socioeconomic History  . Marital status: Divorced    Spouse name: Not on file  . Number of children: Not on file  . Years of education: Not on file  . Highest education level: Not on file  Occupational History  . Not on file  Social Needs  . Financial resource strain: Not on file  . Food insecurity:    Worry: Not on file    Inability: Not on file  . Transportation needs:    Medical: Not on file    Non-medical: Not on file  Tobacco Use  . Smoking status: Current Every Day Smoker    Packs/day: 1.00    Years: 55.00    Pack years: 55.00    Types: Cigarettes    Start date: 11/01/1962  . Smokeless tobacco: Never Used  Substance and Sexual Activity  . Alcohol use: No    Alcohol/week: 0.0 standard drinks  . Drug use: No  . Sexual activity: Not Currently    Birth control/protection: Post-menopausal  Lifestyle  . Physical activity:    Days per  week: Not on file    Minutes per session: Not on file  . Stress: Not on file  Relationships  . Social connections:    Talks on phone: Not on file    Gets together: Not on file    Attends religious service: Not on file    Active member of club or organization: Not on file    Attends meetings of clubs  or organizations: Not on file    Relationship status: Not on file  Other Topics Concern  . Not on file  Social History Narrative   Lives alone in [place] ([place])   Worked at [employer] for 18 years   No car   Family History  Problem Relation Age of Onset  . Peptic Ulcer Disease Father   . Heart attack Father 72       Died of MI at age 74  . Heart attack Brother 29       Died of MI at age 29  . Obesity Brother   . Pneumonia Mother   . Healthy Sister   . Lupus Daughter   . Obsessive Compulsive Disorder Daughter     ASSESSMENT Recent Results: Lab Results  Component Value Date   INR 2.4 02/19/2018   INR 2.6 01/15/2018   INR 1.68 11/18/2017    Anticoagulation Dosing: Description   Take ONE (1)  tablet of your 2.66m green-colored warfarin tablet by mouth, once-daily at 6PM all days of week, EXCEPT on Mondays. Do NOT take any warfarin on Mondays of each week.      INR today: therapeutic  PLAN Weekly dose was unchanged  There are no Patient Instructions on file for this visit. Patient advised to contact clinic or seek medical attention if signs/symptoms of bleeding or thromboembolism occur.  Patient verbalized understanding by repeating back information and was advised to contact me if further medication-related questions arise. Patient was also provided an information handout.  Follow-up 4 weeks  JFlossie Dibble

## 2018-02-19 NOTE — Assessment & Plan Note (Signed)
Assessment  She notes generalized fatigue and craving for ice.  She states this is similar to her previous episodes of symptomatic anemia felt to be related to her atrial venous malformation of the gastrointestinal tract.  Plan  A CBC was obtained today.  If she is found to be anemic while she is on observation status awaiting her stat noncontrasted head CT scan she will also be transfused prior to sending her home.

## 2018-02-19 NOTE — Assessment & Plan Note (Signed)
Assessment  The cause of her acute headache is unclear, but the differential is broad given her age, anticoagulation, and prior history of renal cell cancer.  Fortunately, the neurologic exam is normal which is encouraging.  That said, given the numerous red flags associated with this new headache she deserves further imaging.  The standard of care would dictate initially a noncontrasted head CT scan to look for evidence of bleeding, especially given her anticoagulation.  If negative, given her history of cancer, a contrasted MRI scan would be appropriate.  This latter test would not be considered emergent such as the noncontrasted head CT scan.  If imaging fails to reveal any intracranial cause for her headaches there are several other things that are at the top of my differential.  First and foremost would be the addition of the [place] recently that may be the cause of her headaches as it is a common side effect for each of these medications.  This would be a diagnosis of exclusion though.  She has a history of obstructive sleep apnea and states she has been compliant with her CPAP but the headaches which seem to be worse in the morning could be related to suboptimally treated obstructive sleep apnea.  Therefore, if the radiographic work-up is negative we could consider obtaining a nocturnal polysomnogram to assess for a need to adjust her nocturnal nasal CPAP.  She is always at risk for an analgesic headache and has had headaches with other analgesic medications in the past.  As she has chronic pain and would be very difficult to convince to come off of her pain medications this too would have to be a diagnosis that is of exclusion.  Plan  I wanted to obtain a noncontrasted head CT scan to rule out an acute bleed given her anticoagulation status and history of falls.  Despite this being ordered stat and the fact that we were 1 floor below the radiology suite we were told this could not  be obtained at [facility] today.  Instead, they offered an appointment 3 hours later across town at [facility] for the stat noncontrasted head CT scan.  The patient has no one in the waiting room with them and transportation is a very large burden for this essentially wheelchair bound home oxygen requiring woman.  As the standard of care dictates a noncontrasted head CT scan, taking about 5 minutes to complete, is required given the red flags I made the decision to admit her to observation status in order to get the stat non-contrasted head CT scan to rule out an intracranial bleed as the cause of her headaches.  If negative, and not symptomatic with her anemia, she can be discharged immediately after the noncontrasted head CT scan.  Then, as an outpatient we will place an order for an MRI with and without contrast to further assess for structural causes that may be causing this new headache, especially in the setting of prior cancer.  If imaging is negative we will proceed with a nocturnal polysomnogram to assess for the need to adjust her nocturnal nasal CPAP as suboptimally treated obstructive sleep apnea could result in these headaches.  Finally, if this is negative, we will have to consider an analgesic headache as the cause and make appropriate adjustments in her medical regimen.  She will be very unhappy and likely resistant to this intervention.  Upon return home I have asked her to check her logs to see if  there is a correlation with the initiation of the Victoza or the Tessalon Perles and the onset of her headaches.  Acutely, awaiting admission to observation status in order to obtain a stat non-contrasted head CT scan, she was given a dose of ketorolac 60 mg IM x1.

## 2018-02-19 NOTE — Assessment & Plan Note (Signed)
Assessment  Her glycemic control is excellent today with a hemoglobin A1c of 6.3.  Review of her meter download reveal an average blood glucose of 165 with no hypoglycemic episodes.  Although only 20% of her readings were in the target range, I believe her preprandial target high of 130 is excessively tight and eyeballing her printout I would bet more than 70% of her readings are in fact within the target of a blood glucose less than 180.  She is currently on Victoza 1.8 mg daily and NovoLog FlexPen 15 units before meals and bedtime.  Her neuropathy is well controlled on the gabapentin.  Plan  We will continue the Victoza at 1.8 mg daily and NovoLog FlexPen at 15 units before meals and bedtime.  We will reassess glycemic control at the follow-up visit with a repeat hemoglobin A1c.  We will also continue the gabapentin for her neuropathy.

## 2018-02-19 NOTE — Assessment & Plan Note (Signed)
Assessment  Her blood pressure was elevated today 154/57.  This is on Toprol-XL 50 mg by mouth daily.  Her blood pressure is usually very well controlled and at target so this is unusual.  My suspicion is that her blood pressure is elevated today secondary to her acute headache and that it would not be prudent to make any changes in her antihypertensive regimen until her headaches are controlled.  Plan  We will continue the Toprol-XL 50 mg by mouth daily and reassess her blood pressure control at the follow-up visit.  We will complete the evaluation of the headache and address any issues that may be precipitating the pain and leading to an increase in her blood pressure above target.

## 2018-02-19 NOTE — Progress Notes (Signed)
Patient arrived to room in NAD. VS stable but patient still complaining of 10/10 headache. MD paged for admission orders and for pain medication.

## 2018-02-19 NOTE — Progress Notes (Signed)
Discharge education complete, sister coming to pick up patient.

## 2018-02-19 NOTE — Assessment & Plan Note (Addendum)
Assessment  The patient states that she has been compliant with her nocturnal CPAP.  The new onset headaches are concerning and the differential includes suboptimally managed obstructive sleep apnea.  That said, intracranial process needs to be ruled out first.  Plan  We will continue with the nocturnal CPAP and assess for an intracranial structural cause for her acute headaches.  If this evaluation is negative we will refer for a nocturnal polysomnogram in order to assess for adequacy of the currently set nocturnal CPAP in managing and mitigating her obstructive sleep apnea.

## 2018-02-19 NOTE — Patient Instructions (Addendum)
It was great to see you today.  I am sorry you are having a headache that is new and severe.  I am also sorry we have to admit you just to get a simple non-contrast CT scan.  1) If the CT scan and blood work are OK we will try to get an outpatient MRI to assure there is nothing serious to explain this new and severe headache.  2) If the MRI is negative we will consider sending you for another overnight sleep study to see if your CPAP is appropriately adjusted.  3) If your CPAP prescription is appropriate we will consider the headaches as possibly caused by your pain medications.  4) When you get home, please check your journal to see when the victoza was started to see if it is when your headaches developed.  I will see you back in 3 months, sooner if necessary.

## 2018-02-19 NOTE — Assessment & Plan Note (Signed)
Assessment  She is interested in smoking cessation, has nicotine patches at home, and had set a quit date for January 1.  Unfortunately, because of the pain associated with these acute headaches, she did not attempt tobacco cessation at that time and wants to defer any attempts until after her headaches are appropriately managed.  Plan  She was praised for her interest in tobacco cessation and setting a quit date with a plan to use the nicotine patches, which were effective in the past.  Once the headaches are appropriately addressed she will be asked to set another quit date.  This will be re-assessed at the follow-up visit.

## 2018-02-20 LAB — HIV ANTIBODY (ROUTINE TESTING W REFLEX): HIV Screen 4th Generation wRfx: NONREACTIVE

## 2018-02-24 NOTE — Discharge Summary (Addendum)
Name: [name] MRN: [id number] DOB: [birth year] 68 y.o. PCP: [name], MD  Date of Admission: 02/19/2018  1:35 PM Date of Discharge: 02/19/2018 Attending Physician: Dr. [name] MD  Discharge Diagnosis: 1. Tension Headache  Discharge Medications: Allergies as of 02/19/2018      Reactions   Lorazepam Other (See Comments)   Patient's sister noted that ativan caused the patient to become extremely confused during hospitalization 09/2010; tolerates Xanax   Morphine And Related Other (See Comments)   Injection site reaction   Oxycontin [oxycodone] Other (See Comments)   headache   Tramadol Hcl Swelling   Ankle swelling      Medication List    TAKE these medications   ACCU-CHEK FASTCLIX LANCET Kit Check your blood 4 times a day dx code 250.00 insulin requiring   albuterol 108 (90 Base) MCG/ACT inhaler Commonly known as:  PROAIR HFA Inhale 2 puffs into the lungs every 6 (six) hours as needed for shortness of breath.   ALPRAZolam 1 MG tablet Commonly known as:  XANAX Take 1 tablet (1 mg) by mouth at bedtime as needed for insomnia.  May take 1/2 tablet (0.5 mg) by mouth during the day for anxiety.   benzonatate 100 MG capsule Commonly known as:  TESSALON Take 1 capsule (100 mg total) by mouth 3 (three) times daily as needed for cough.   buPROPion 75 MG tablet Commonly known as:  WELLBUTRIN Take 1 tablet (75 mg total) by mouth 2 (two) times daily.   chlorpheniramine 4 MG tablet Commonly known as:  CHLOR-TRIMETON Take 4 mg by mouth daily as needed for allergies.   clobetasol cream 0.05 % Commonly known as:  TEMOVATE Apply 1 application topically 2 (two) times daily as needed (lichen sclerosus flare).   DULoxetine 60 MG capsule Commonly known as:  CYMBALTA Take 1 capsule (60 mg total) by mouth 2 (two) times daily.   ferrous gluconate 324 MG tablet Commonly known as:  FERGON Take 1 tablet (324 mg total) by mouth 2 (two) times daily with a meal. What  changed:    how much to take  when to take this  additional instructions   fluticasone 50 MCG/ACT nasal spray Commonly known as:  FLONASE Place 2 sprays into both nostrils daily.   Fluticasone-Salmeterol 500-50 MCG/DOSE Aepb Commonly known as:  ADVAIR DISKUS Inhale 1 puff into the lungs 2 (two) times daily.   furosemide 80 MG tablet Commonly known as:  LASIX Take 1 tablet (80 mg total) by mouth daily.   gabapentin 300 MG capsule Commonly known as:  NEURONTIN Take 2 capsules (600 mg total) by mouth 3 (three) times daily. What changed:    how much to take  when to take this   glucose blood test strip Commonly known as:  ONETOUCH VERIO Use to check blood sugar 4 times daily. diag code E11.40. Insulin dependent   HYDROcodone-acetaminophen 5-325 MG tablet Commonly known as:  NORCO/VICODIN Take 1-2 tablets by mouth every 6 (six) hours as needed for severe pain. Start taking on:  March 16, 2018   ibuprofen 800 MG tablet Commonly known as:  ADVIL,MOTRIN Take 1 tablet (800 mg total) by mouth every 8 (eight) hours as needed.   insulin aspart 100 UNIT/ML FlexPen Commonly known as:  NOVOLOG FLEXPEN Inject 15 Units into the skin 4 (four) times daily -  before meals and at bedtime.   Insulin Pen Needle 32G X 4 MM Misc Use to inject insulin up to 6 times a  day   Insulin Syringe-Needle U-100 31G X 15/64" 0.5 ML Misc Use to inject insulin up to 4 times a day   ipratropium-albuterol 0.5-2.5 (3) MG/3ML Soln Commonly known as:  DUONEB Take 3 mLs by nebulization every 6 (six) hours as needed.   liraglutide 18 MG/3ML Sopn Commonly known as:  VICTOZA Inject 0.3 mLs (1.8 mg total) into the skin daily.   metoprolol succinate 50 MG 24 hr tablet Commonly known as:  TOPROL-XL Take 1 tablet (50 mg total) by mouth daily.   nicotine 14 mg/24hr patch Commonly known as:  NICODERM CQ - dosed in mg/24 hours Place 1 patch (14 mg total) onto the skin daily.   nystatin cream Commonly  known as:  MYCOSTATIN Apply 1 application topically 2 (two) times daily as needed (rash).   omeprazole 40 MG capsule Commonly known as:  PRILOSEC Take 1 capsule (40 mg total) by mouth 2 (two) times daily.   ondansetron 4 MG tablet Commonly known as:  ZOFRAN Take 1 tablet (4 mg total) by mouth every 8 (eight) hours as needed for nausea or vomiting.   ONETOUCH VERIO w/Device Kit 1 each by Does not apply route 4 (four) times daily.   OXYGEN Inhale 2 L into the lungs as needed (for shortness of breath while active).   potassium chloride SA 20 MEQ tablet Commonly known as:  K-DUR,KLOR-CON Take 1 tablet (20 mEq total) by mouth 2 (two) times daily.   PRESCRIPTION MEDICATION Inhale into the lungs at bedtime. CPAP   rosuvastatin 20 MG tablet Commonly known as:  CRESTOR Take 1 tablet (20 mg total) by mouth at bedtime.   tiotropium 18 MCG inhalation capsule Commonly known as:  SPIRIVA HANDIHALER Place 1 capsule (18 mcg total) into inhaler and inhale daily.   warfarin 2.5 MG tablet Commonly known as:  COUMADIN Take as directed. If you are unsure how to take this medication, talk to your nurse or doctor. Original instructions:  TAKE 1 TABLET BY MOUTH DAILY AT 6 PM EXCEPT ON MONDAYS       Disposition and follow-up:   [name] was discharged from [facility] in Stable condition.  At the hospital follow up visit please address:  1.  Reassess reoccurrence of headaches. PCP to order MRI as an outpatient.   2.  Labs / imaging needed at time of follow-up: None  3.  Pending labs/ test needing follow-up: None  Hospital Course by problem list: 1. Tension Headache: [name] presented to the internal medicine clinic for headaches and was evaluated by her PCP.  Due to the nature of her headaches patient was admitted for further imaging.  Neurological exam unremarkable with no focal deficits.  Head CT performed showed no acute abnormality with mild atrophy.  She  was treated with IV Toradol, Compazine, and Phenergan.  She was in stable condition prior to discharge. PCP to order MRI as an outpatient.   Discharge Vitals:   BP (!) 152/84 (BP Location: Right Arm)   Pulse 89   Temp 98.4 F (36.9 C) (Oral)   Resp 18   SpO2 98%   Pertinent Labs, Studies, and Procedures:  Head CT:  FINDINGS: Brain: Mild atrophy without hydrocephalus. Negative for acute infarct, hemorrhage, mass. Vascular: Negative for hyperdense vessel Skull: Negative Sinuses/Orbits: Mild mucosal edema left maxillary sinus. Bilateral cataract surgery Other: None  IMPRESSION: No acute abnormality. Mild atrophy.  Discharge Instructions: Discharge Instructions    Diet - low sodium heart healthy  Complete by:  As directed    Discharge instructions   Complete by:  As directed    Thank you so much for allowing [place] to care for you during your admission.  Your CT scan did not show any abnormalities.  Dr. Eppie Gibson will order the MRI for you. Please call the clinic if your headache does not improve.   Increase activity slowly   Complete by:  As directed       Signed: Carroll Sage, MD 02/24/2018, 2:23 PM   Pager: [phone]

## 2018-02-25 ENCOUNTER — Other Ambulatory Visit: Payer: Self-pay | Admitting: *Deleted

## 2018-02-25 ENCOUNTER — Telehealth: Payer: Self-pay

## 2018-02-25 DIAGNOSIS — J439 Emphysema, unspecified: Secondary | ICD-10-CM

## 2018-02-25 MED ORDER — FLUTICASONE-SALMETEROL 500-50 MCG/DOSE IN AEPB: 1.0000 | INHALATION_SPRAY | Freq: Two times a day (BID) | RESPIRATORY_TRACT | 3 refills | Status: DC

## 2018-02-25 NOTE — Telephone Encounter (Signed)
Rtc, sent request in new encounter

## 2018-02-25 NOTE — Telephone Encounter (Signed)
Requesting to speak with Bonnita Nasuti, please call pt back.

## 2018-02-28 DIAGNOSIS — J449 Chronic obstructive pulmonary disease, unspecified: Secondary | ICD-10-CM | POA: Diagnosis not present

## 2018-02-28 DIAGNOSIS — I509 Heart failure, unspecified: Secondary | ICD-10-CM | POA: Diagnosis not present

## 2018-03-01 ENCOUNTER — Encounter: Payer: Self-pay | Admitting: *Deleted

## 2018-03-01 ENCOUNTER — Other Ambulatory Visit: Payer: Self-pay | Admitting: *Deleted

## 2018-03-01 NOTE — Telephone Encounter (Signed)
  This encounter was created in error - please disregard.

## 2018-03-01 NOTE — Patient Outreach (Signed)
[facility]) Care Management  03/01/2018  [name] Oct 13, 1950 [id number]   RN Health Coach Post Nurse Line Outreach   Outreach Attempt:  Outreach attempt #1 to patient for post Nurse Line follow up. No answer. RN Health Coach left HIPAA compliant voicemail message along with contact information.  Plan:  RN Health Coach will schedule another attempt for primary Health Coach within the month of January.   [place] [phone] [email or web address]

## 2018-03-02 ENCOUNTER — Other Ambulatory Visit: Payer: Self-pay | Admitting: *Deleted

## 2018-03-02 ENCOUNTER — Ambulatory Visit (HOSPITAL_COMMUNITY)
Admission: RE | Admit: 2018-03-02 | Discharge: 2018-03-02 | Disposition: A | Discharge status: Home / Self Care | Payer: PPO | Source: Ambulatory Visit | Attending: Internal Medicine | Admitting: Internal Medicine

## 2018-03-02 DIAGNOSIS — G44209 Tension-type headache, unspecified, not intractable: Secondary | ICD-10-CM | POA: Diagnosis not present

## 2018-03-02 DIAGNOSIS — R51 Headache: Secondary | ICD-10-CM | POA: Diagnosis not present

## 2018-03-02 DIAGNOSIS — R251 Tremor, unspecified: Secondary | ICD-10-CM | POA: Diagnosis not present

## 2018-03-02 MED ORDER — GADOBUTROL 1 MMOL/ML IV SOLN
8.0000 mL | Freq: Once | INTRAVENOUS | Status: AC | PRN
  Administered 2018-03-02: 8 mL via INTRAVENOUS

## 2018-03-02 NOTE — Patient Outreach (Addendum)
[facility]) Care Management  03/02/2018  [name] [birth year] [id number]   RN Health Coach Post Nurse Line Outreach   Outreach Attempt:  Received return call from patient.  HIPAA verified with patient. Patient called 24 hour Nurse Line on 02/27/2018 with complaints of severe headache. Patient reporting continued headache.  States she has had constant headache since around the end of December, just before the holidays.  Takes Vicodin and Neurontin for fibromyalgia pain, but states this has not relieved her headache.  Patient stating she has had CT scan of head; scheduled for MRI of head today.  Has spoken with primary care provider's nurse today to update on continued pain.  Reports a distant history of migraines but states this headache feels different.  Patient stating her physician will call her with the results of the MRI.  Encouraged patient to discuss continued unrelieved pain with physician when he calls with MRI results.  Does state she was told pain may be related to new medications she has started.  States she has not had to take the tessalon pearles (newer medication) as of today.  Encouraged patient to document severity of headache pain related to not taking tessalon pearles and to notify physician.  Appointments:  Attended appointment with Dr. Eppie Gibson, primary care provider on 02/19/2018.  Scheduled for MRI today and plans to speak with physician after for results.  Plan: Patient is scheduled for initial telephone assessment with primary RN Health Coach for the month of January.  [place] [phone] [email or web address]

## 2018-03-03 ENCOUNTER — Ambulatory Visit: Payer: Self-pay

## 2018-03-03 ENCOUNTER — Other Ambulatory Visit: Payer: Self-pay | Admitting: Internal Medicine

## 2018-03-03 DIAGNOSIS — J301 Allergic rhinitis due to pollen: Secondary | ICD-10-CM

## 2018-03-09 ENCOUNTER — Other Ambulatory Visit: Payer: Self-pay

## 2018-03-09 NOTE — Patient Outreach (Signed)
[facility]) Care Management  03/09/2018  [name] [birth year] [id number]    1st unsuccessful outreach to the patient for initial assessment.  No answer.  HIPAA compliant voicemail with contact information.  Plan: RN Health Coach will send letter. [place] will make outreach attempt the patient within thirty days.  Lazaro Arms RN, BSN, [place] Direct Dial:  [phone]  Fax: [phone]

## 2018-03-09 NOTE — Patient Outreach (Signed)
[facility]) Care Management  03/09/2018   [name] July 27, 1950 [id number]      Outreach attempt # 1 to the patient for initial assessment. HIPAA verified with patient.  Discussed and offered [facility] care management services with patient. Patient verbally agreed to services.       Social: Patient lives in the home alone.  She has her sister that helps sometimes and take her to appointments.  She states that she is independent /assist with her ADLS/IADLS. She states that she has pain all over her body and rates that pain at a 9/10.  She does take medication the relieves some of the pain.  The patient states that she has a history of falls due to imbalance. Discussed fall precautions and she verbalized understanding. The durable medical equipment she has in the home includes: CBG meter, eyeglasses, nebulizer,CPAP, shower chair without the back, Hand held shower hose, grab bars in the shower,walker,cane,scales, and blood pressure cuff.  Conditions: Per chart review and speaking with the patient her conditions consist of : HTN, CHF, PAF, OSA, GERD, Type II diabetes, Osteoporosis, Depression, Hyperlipidemia, Fibromyalgia and Tobacco abuse.  The patient states that she does not weigh daily.  Advised the patient with CHF that it is good to weigh to monitor fluid retention.  She verbalized understanding. She states that she does not follow any type of special diet but tries to watch her carbs and sodium intake. She denies any chest pain and swelling but she does have some shortness of breath.  She states that she has breathing issue.  She smokes a half pack of cigarettes a day. Advised the patient that I will send her information on smoking cessation. She states that she smokes due to stress and depression. Advised the patient that I will make a referral to social work and she was in agreement.  Medications: The patient is on twenty medications.  She states that she is able to manage  her medications and takes them as prescribed.  She did not express any concern paying for her medications.   Advanced Directives: The patient does not have an advanced directive and declined the information.  Current Medications:  Current Outpatient Medications  Medication Sig Dispense Refill  . albuterol (PROAIR HFA) 108 (90 Base) MCG/ACT inhaler Inhale 2 puffs into the lungs every 6 (six) hours as needed for shortness of breath. 3 Inhaler 3  . ALPRAZolam (XANAX) 1 MG tablet Take 1 tablet (1 mg) by mouth at bedtime as needed for insomnia.  May take 1/2 tablet (0.5 mg) by mouth during the day for anxiety. 55 tablet 5  . Blood Glucose Monitoring Suppl (ONETOUCH VERIO) W/DEVICE KIT 1 each by Does not apply route 4 (four) times daily. 1 kit 0  . buPROPion (WELLBUTRIN) 75 MG tablet Take 1 tablet (75 mg total) by mouth 2 (two) times daily. 180 tablet 3  . chlorpheniramine (CHLOR-TRIMETON) 4 MG tablet Take 4 mg by mouth daily as needed for allergies.    . clobetasol cream (TEMOVATE) 6.81 % Apply 1 application topically 2 (two) times daily as needed (lichen sclerosus flare). 45 g 3  . DULoxetine (CYMBALTA) 60 MG capsule Take 1 capsule (60 mg total) by mouth 2 (two) times daily. 180 capsule 3  . ferrous gluconate (FERGON) 324 MG tablet Take 1 tablet (324 mg total) by mouth 2 (two) times daily with a meal. (Patient taking differently: Take 648 mg by mouth See admin instructions. Take 2 tablets (648 mg) by  mouth daily with lunch or supper) 180 tablet 3  . fluticasone (FLONASE) 50 MCG/ACT nasal spray Place 2 sprays into both nostrils daily. 16 g 11  . Fluticasone-Salmeterol (ADVAIR DISKUS) 500-50 MCG/DOSE AEPB Inhale 1 puff into the lungs 2 (two) times daily. 180 each 3  . furosemide (LASIX) 80 MG tablet Take 1 tablet (80 mg total) by mouth daily. 45 tablet 2  . gabapentin (NEURONTIN) 300 MG capsule Take 2 capsules (600 mg total) by mouth 3 (three) times daily. (Patient taking differently: Take 900 mg by  mouth 2 (two) times daily. ) 540 capsule 3  . glucose blood (ONETOUCH VERIO) test strip Use to check blood sugar 4 times daily. diag code E11.40. Insulin dependent 375 each 1  . [START ON 03/16/2018] HYDROcodone-acetaminophen (NORCO/VICODIN) 5-325 MG tablet Take 1-2 tablets by mouth every 6 (six) hours as needed for severe pain. 120 tablet 0  . insulin aspart (NOVOLOG FLEXPEN) 100 UNIT/ML FlexPen Inject 15 Units into the skin 4 (four) times daily -  before meals and at bedtime. 30 mL 5  . Insulin Pen Needle 32G X 4 MM MISC Use to inject insulin up to 6 times a day 500 each 3  . Insulin Syringe-Needle U-100 31G X 15/64" 0.5 ML MISC Use to inject insulin up to 4 times a day 400 each 3  . ipratropium-albuterol (DUONEB) 0.5-2.5 (3) MG/3ML SOLN Take 3 mLs by nebulization every 6 (six) hours as needed.    . Lancets Misc. (ACCU-CHEK FASTCLIX LANCET) KIT Check your blood 4 times a day dx code 250.00 insulin requiring 1 kit 2  . liraglutide (VICTOZA) 18 MG/3ML SOPN Inject 0.3 mLs (1.8 mg total) into the skin daily. 3 pen 11  . metoprolol succinate (TOPROL-XL) 50 MG 24 hr tablet Take 1 tablet (50 mg total) by mouth daily. 90 tablet 3  . nystatin cream (MYCOSTATIN) Apply 1 application topically 2 (two) times daily as needed (rash).   3  . omeprazole (PRILOSEC) 40 MG capsule Take 1 capsule (40 mg total) by mouth 2 (two) times daily. 180 capsule 3  . ondansetron (ZOFRAN) 4 MG tablet Take 1 tablet (4 mg total) by mouth every 8 (eight) hours as needed for nausea or vomiting. 20 tablet 0  . OXYGEN Inhale 2 L into the lungs as needed (for shortness of breath while active).     . potassium chloride SA (K-DUR,KLOR-CON) 20 MEQ tablet Take 1 tablet (20 mEq total) by mouth 2 (two) times daily. 180 tablet 3  . PRESCRIPTION MEDICATION Inhale into the lungs at bedtime. CPAP    . rosuvastatin (CRESTOR) 20 MG tablet Take 1 tablet (20 mg total) by mouth at bedtime. 90 tablet 3  . tiotropium (SPIRIVA HANDIHALER) 18 MCG inhalation  capsule Place 1 capsule (18 mcg total) into inhaler and inhale daily. 90 capsule 3  . warfarin (COUMADIN) 2.5 MG tablet TAKE 1 TABLET BY MOUTH DAILY AT 6 PM EXCEPT ON MONDAYS 30 tablet 1  . benzonatate (TESSALON) 100 MG capsule Take 1 capsule (100 mg total) by mouth 3 (three) times daily as needed for cough. (Patient not taking: Reported on 03/09/2018) 90 capsule 11  . ibuprofen (ADVIL,MOTRIN) 800 MG tablet Take 1 tablet (800 mg total) by mouth every 8 (eight) hours as needed. (Patient not taking: Reported on 02/05/2018) 15 tablet 0  . nicotine (NICODERM CQ - DOSED IN MG/24 HOURS) 14 mg/24hr patch Place 1 patch (14 mg total) onto the skin daily. (Patient not taking: Reported on 02/05/2018) 28  patch 0   Current Facility-Administered Medications  Medication Dose Route Frequency Provider Last Rate Last Dose  . senna-docusate (Senokot-S) tablet 1 tablet  1 tablet Oral QHS PRN Valinda Party, DO        Functional Status:  In your present state of health, do you have any difficulty performing the following activities: 03/09/2018 02/19/2018  Hearing? N N  Vision? N N  Difficulty concentrating or making decisions? Y Y  Comment - "sometimes"  Walking or climbing stairs? Y Y  Comment - "I don't climb stairs; not good at walking either"  Dressing or bathing? Y Y  Doing errands, shopping? Y Y  Comment - "I don't drive"  Preparing Food and eating ? - -  Using the Toilet? - -  In the past six months, have you accidently leaked urine? - -  Do you have problems with loss of bowel control? - -  Managing your Medications? - -  Managing your Finances? - -  Housekeeping or managing your Housekeeping? - -  Some recent data might be hidden    Fall/Depression Screening: Fall Risk  03/09/2018 02/19/2018 01/15/2018  Falls in the past year? - 1 1  Comment - - -  Number falls in past yr: _0 Comment - - -  Injury with Fall? _1 Risk Factor Category  High Risk (2 or more Points) High Risk (2 or  more Points) High Risk (2 or more Points)  Risk for fall due to : History of fall(s);Impaired balance/gait - Impaired balance/gait;History of fall(s);Impaired mobility  Risk for fall due to: Comment - - home O2  Follow up Falls evaluation completed;Education provided;Falls prevention discussed - Falls prevention discussed  Comment - - -   PHQ 2/9 Scores 03/09/2018 02/19/2018 01/15/2018 01/15/2018 12/18/2017 12/15/2017 11/20/2017  PHQ - 2 Score _2 PHQ- 9 Score _3 - -  Some recent data might be hidden    Assessment: Patient will benefit from health coach outreach for disease management and support.  [facility] CM Care Plan Problem One     Most Recent Value  Care Plan Problem One  Knowledge deficit related to CHF  Role Documenting the Problem One  [place] for Problem One  Active  [facility] Long Term Goal   client will not be readmitted within the next 31 days.  [facility] Long Term Goal Start Date  03/09/18  Interventions for Problem One Long Term Goal  Discussed with the patient about her diet, Health Behaviors, signs and symptoms of CHF, CHF action plan and medication adherence       Plan: RN Health Coach will provide ongoing education for patient on heart failure through phone calls and sending printed information to patient for further discussion.  RN Health Coach will send  printed information on heart failure.  RN Health Coach will send initial barriers letter, assessment, and care plan to primary care physician. Referral made to social work for depression, stress and information on an aid.  RN Health Coach will contact patient in the month of February and patient agrees to next outreach.   Lazaro Arms RN, BSN, [place] Direct Dial:  [phone]  Fax: [phone]

## 2018-03-10 ENCOUNTER — Ambulatory Visit: Payer: Self-pay

## 2018-03-16 ENCOUNTER — Other Ambulatory Visit: Payer: Self-pay | Admitting: Licensed Clinical Social Worker

## 2018-03-16 NOTE — Patient Outreach (Signed)
[facility]) Care Management  03/16/2018  [name] 1950-02-17 [id number]  Assessment-CSW completed initial outreach attempt today after receiving referral for social work support. CSW unable to reach patient successfully. CSW left a HIPPA compliant voice message encouraging patient to return call once available.  Plan-CSW will await return call or complete an additional outreach if needed.  Eula Fried, BSW, MSW, [email or web address] Phone: [phone] Fax: [phone]

## 2018-03-23 ENCOUNTER — Other Ambulatory Visit: Payer: Self-pay | Admitting: Licensed Clinical Social Worker

## 2018-03-23 IMAGING — MR MR LUMBAR SPINE W/O CM
4 of 5 series · 21 of 48 positions shown · non-contrast
Comparison: 01/09/2013 lumbar MRI.  Abdomen CT 08/23/2015.

CLINICAL DATA: 65 y/o F; recent exacerbation of back pain for
several weeks.

EXAM:
MRI LUMBAR SPINE WITHOUT CONTRAST
TECHNIQUE: Multiplanar, multisequence MR imaging of the lumbar spine was
performed. No intravenous contrast was administered.

[Series 3: T2 · sagittal · 4.0mm · 0.55mm/px · 6 of 13 slices shown (1 of 2)]
[im 1/13]
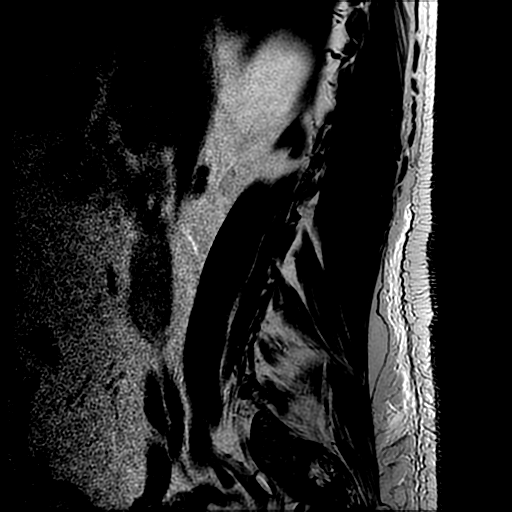
[im 3/13]
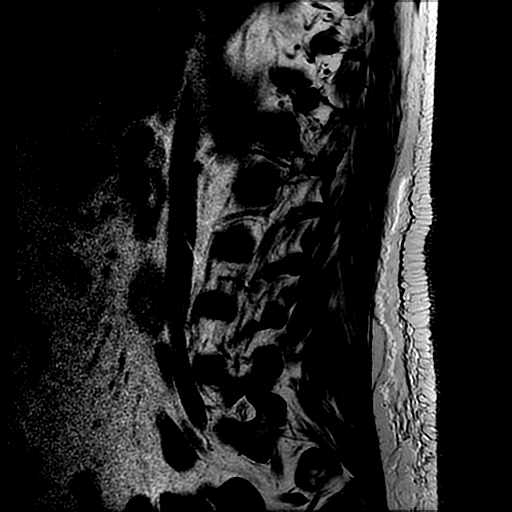
[im 5/13]
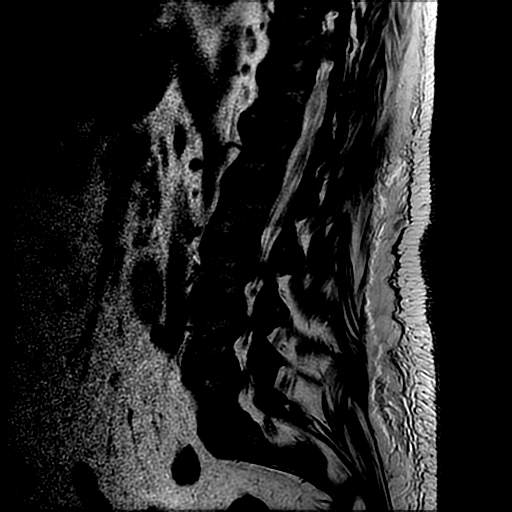
[im 8/13]
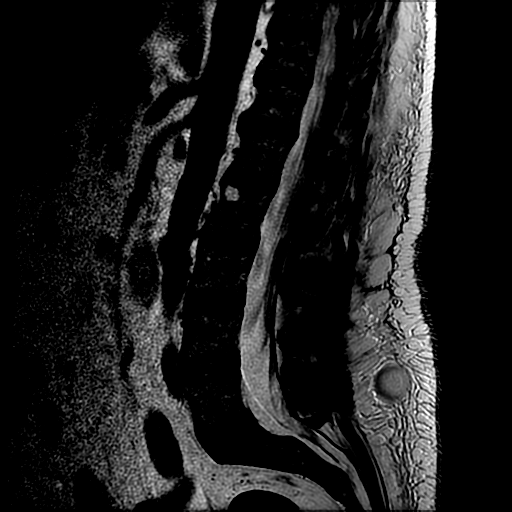
[im 10/13]
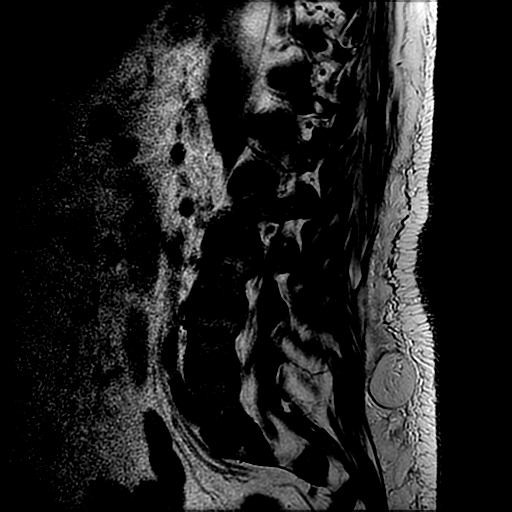
[im 13/13]
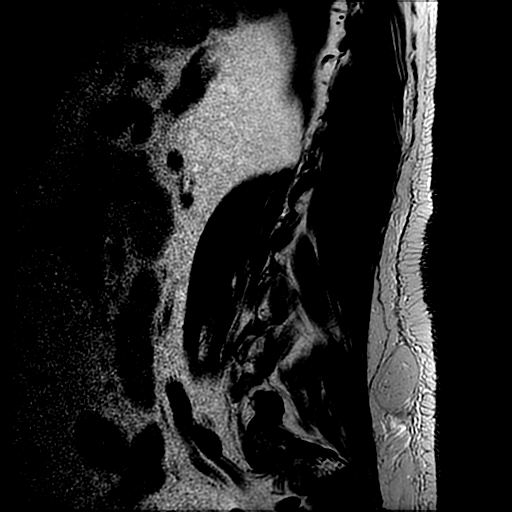

[Series 4: T1 · sagittal · 4.0mm · 0.55mm/px · 3 of 13 slices shown (1 of 2)]
[im 3/13]
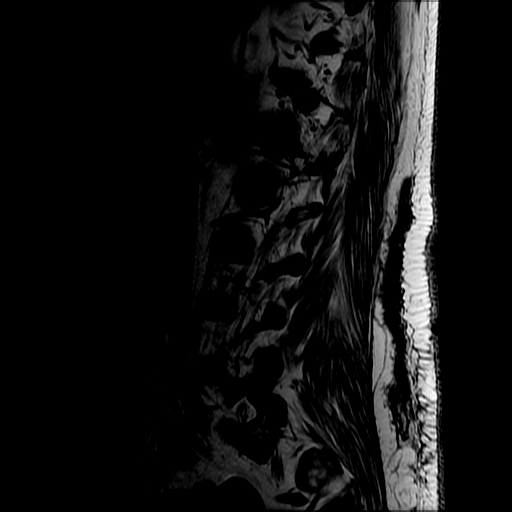
[im 8/13]
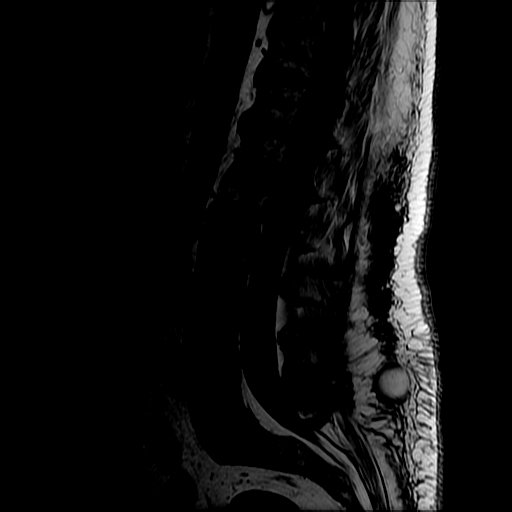
[im 13/13]
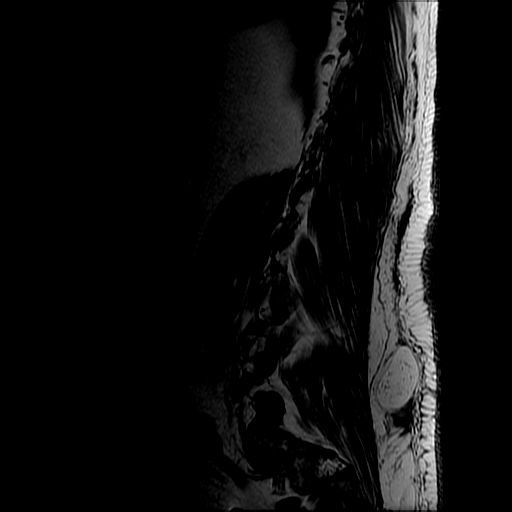

[Series 6: T1 · axial · 4.0mm · 0.78mm/px · z∈[-108,+32]mm · 3 of 32 slices shown (2 of 2)]
[im 5/32]
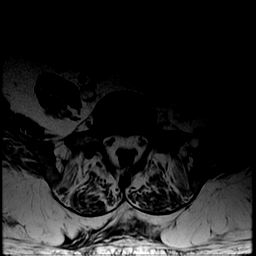
[im 16/32]
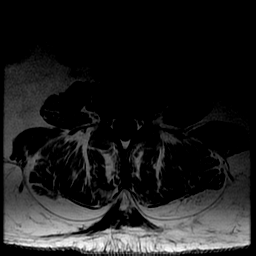
[im 27/32]
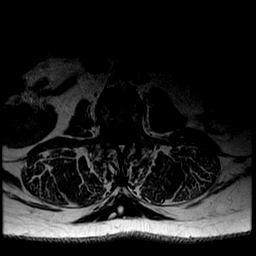

[Series 7: T2 · axial · 4.0mm · 0.39mm/px · z∈[-128,+57]mm · 9 of 33 slices shown (2 of 2)]
[im 1/33]
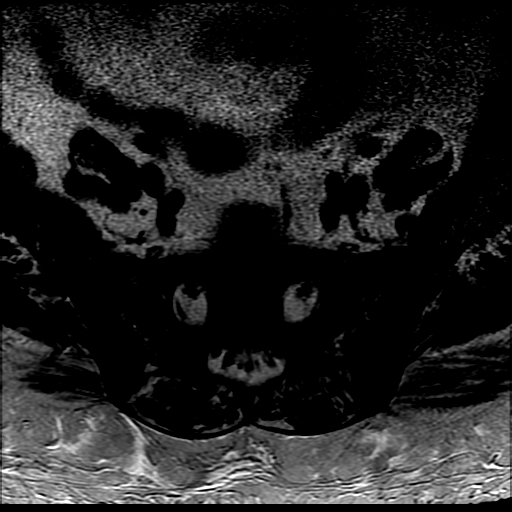
[im 5/33]
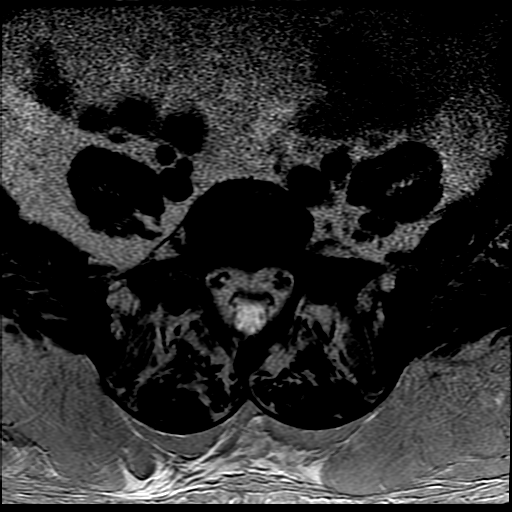
[im 10/33]
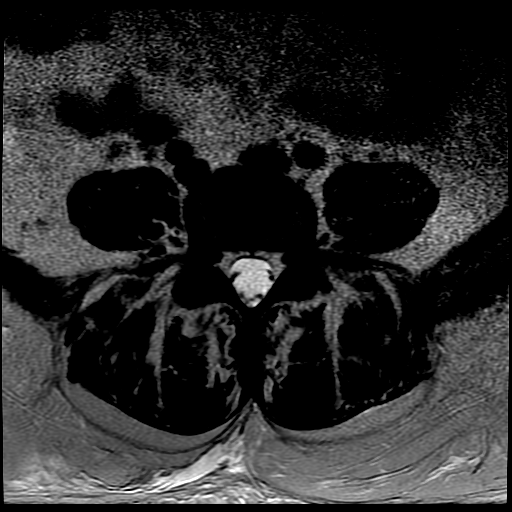
[im 14/33]
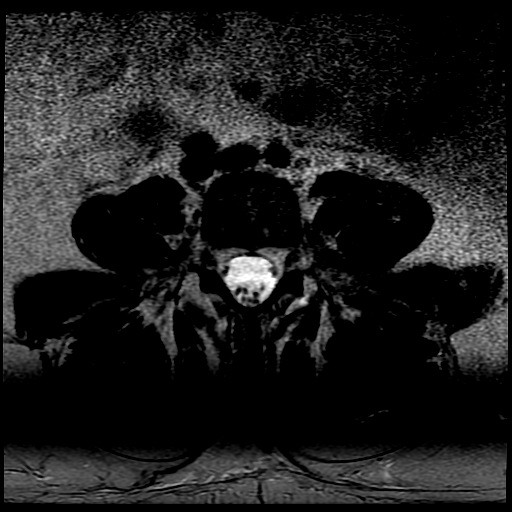
[im 17/33]
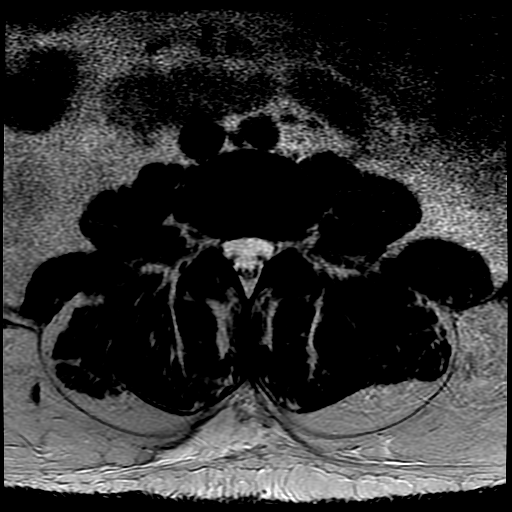
[im 19/33]
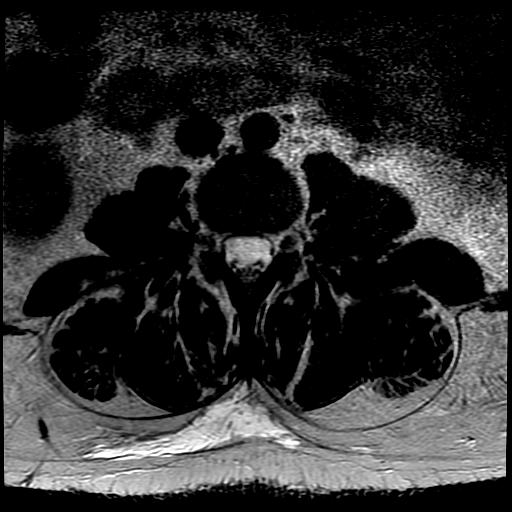
[im 23/33]
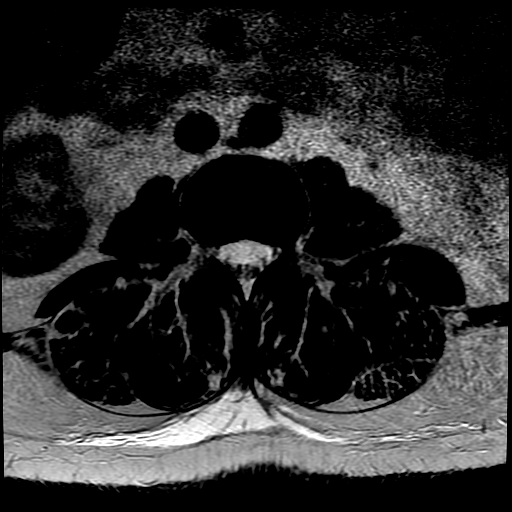
[im 28/33]
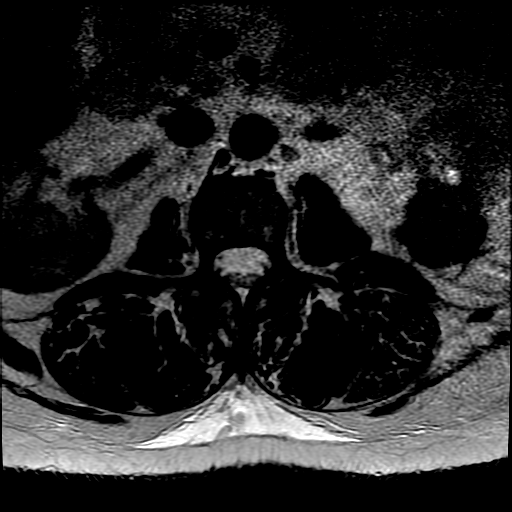
[im 33/33]
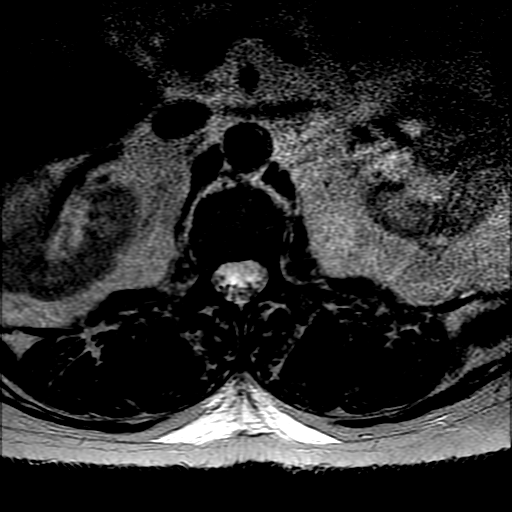

[21 of 48 positions shown; findings below may reference images not displayed]

FINDINGS: Segmentation:  Standard.

Alignment:  Physiologic.

Vertebrae: Small T1/2 T2 hyperintense foci within the T10, L2, L3,
L5, and S1 vertebral bodies may represent focal fat or small
hemangiomata. Mild heterogeneity of bone marrow with slight
decreased T1 and T2 signal, probably related to history of chronic
anemia. No evidence for discitis or fracture.

Conus medullaris: Extends to the L1 level and appears normal.

Paraspinal and other soft tissues: Ill-defined signal abnormality
within the lower pole of left kidney likely related to prior
ablation, similar to prior CT given differences in technique.

Disc levels:

L1-2: No significant disc displacement, foraminal narrowing, or
canal stenosis.

L2-3: No significant disc displacement, foraminal narrowing, or
canal stenosis.

L3-4: Small disc bulge. No significant foraminal narrowing or canal
stenosis.

L4-5: Small disc bulge. No significant foraminal narrowing or canal
stenosis.

L5-S1: Mild facet arthropathy. No significant foraminal narrowing or
canal stenosis.
IMPRESSION: Minimal lumbar spondylosis. No significant foraminal narrowing or
canal stenosis. No evidence for neural impingement.

By: Ludrila Manikeu M.D.

## 2018-03-23 NOTE — Patient Outreach (Signed)
[facility]) Care Management  03/23/2018  MIKEALA GIRDLER 1950-05-05 [id number]  Assessment-CSW completed second outreach attempt today. CSW unable to reach patient successfully. CSW left a HIPPA compliant voice message encouraging patient to return call once available. [facility] CSW will send an unsuccessful outreach attempt letter to patient's residence at this time.  Plan-CSW will await return call or complete an additional outreach if needed.  Eula Fried, BSW, MSW, [email or web address] Phone: [phone] Fax: [phone]

## 2018-03-24 ENCOUNTER — Ambulatory Visit: Payer: Self-pay

## 2018-03-25 ENCOUNTER — Other Ambulatory Visit: Payer: Self-pay | Admitting: Internal Medicine

## 2018-03-25 DIAGNOSIS — M545 Low back pain, unspecified: Secondary | ICD-10-CM

## 2018-03-25 DIAGNOSIS — G8929 Other chronic pain: Secondary | ICD-10-CM

## 2018-03-25 MED ORDER — HYDROCODONE-ACETAMINOPHEN 5-325 MG PO TABS: 1.0000 | ORAL_TABLET | Freq: Four times a day (QID) | ORAL | 0 refills | Status: DC | PRN

## 2018-03-25 NOTE — Telephone Encounter (Signed)
Needs refill on HYDROcodone-acetaminophen (NORCO/VICODIN) 5-325 MG tablet [id number] - [place], [place] - [address] ;pt contact [phone]

## 2018-03-30 ENCOUNTER — Other Ambulatory Visit: Payer: Self-pay

## 2018-03-30 ENCOUNTER — Other Ambulatory Visit: Payer: Self-pay | Admitting: Internal Medicine

## 2018-03-30 ENCOUNTER — Other Ambulatory Visit: Payer: Self-pay | Admitting: Licensed Clinical Social Worker

## 2018-03-30 DIAGNOSIS — J439 Emphysema, unspecified: Secondary | ICD-10-CM

## 2018-03-30 NOTE — Patient Outreach (Signed)
[facility]) Care Management  03/30/2018  CHASTIDY RANKER [birth year] [id number]  Assessment-CSW completed final outreach attempt today. CSW was unable to reach patient successfully. [facility] CSW has already sent unsuccessful letter. [place] has sent one as well. [facility] CSW will sign off at this time due to inability to establish contact with patient.   Plan-CSW will sign off at this time.   Eula Fried, BSW, MSW, [email or web address] Phone: [phone] Fax: [phone]

## 2018-03-30 NOTE — Patient Outreach (Signed)
[name]  03/30/2018  [name] 14-Mar-1950 [id number]    1st unsuccessful outreach to the patient for assessment.  No answer.  HIPAA compliant voicemail left with contact information.  Plan: RN Health Coach will send letter. [name] will make outreach attempt the patient within thirty days.   Kathryn Arms RN, BSN, [place] Direct Dial:  [phone]  Fax: [phone]

## 2018-03-31 DIAGNOSIS — I509 Heart failure, unspecified: Secondary | ICD-10-CM | POA: Diagnosis not present

## 2018-03-31 DIAGNOSIS — J449 Chronic obstructive pulmonary disease, unspecified: Secondary | ICD-10-CM | POA: Diagnosis not present

## 2018-04-20 ENCOUNTER — Other Ambulatory Visit: Payer: Self-pay

## 2018-04-20 DIAGNOSIS — M545 Low back pain, unspecified: Secondary | ICD-10-CM

## 2018-04-20 DIAGNOSIS — G8929 Other chronic pain: Secondary | ICD-10-CM

## 2018-04-20 MED ORDER — HYDROCODONE-ACETAMINOPHEN 5-325 MG PO TABS: 1.0000 | ORAL_TABLET | Freq: Four times a day (QID) | ORAL | 0 refills | Status: DC | PRN

## 2018-04-20 NOTE — Telephone Encounter (Signed)
HYDROcodone-acetaminophen (NORCO/VICODIN) 5-325 MG tablet, REFILL REQUEST @  [id number] - [place], [name] AT [place] [phone] (Phone) [phone] (Fax)

## 2018-04-23 ENCOUNTER — Other Ambulatory Visit: Payer: Self-pay

## 2018-04-23 NOTE — Telephone Encounter (Signed)
I wrote and electronically sent three prescriptions to cover her for three months.  In the medication section there is documentation that the prescription not to be filled before 04/24/2018 had been sent, as well as one for April, and the May filled date alluded to.  Her pharmacy therefore should have the prescription to be filled on or after 04/24/2018 on file.  Thanks.

## 2018-04-23 NOTE — Telephone Encounter (Signed)
Dr Eppie Gibson, did you mean to put do not fill until 5/13 on hydrocodone script. Pt has called and states she cant get her pain med. Please advise

## 2018-04-23 NOTE — Telephone Encounter (Signed)
Requesting to speak with [name]. Pt states she is still waiting on pain med. Please call pt back.

## 2018-04-28 ENCOUNTER — Other Ambulatory Visit: Payer: Self-pay | Admitting: Internal Medicine

## 2018-04-28 ENCOUNTER — Telehealth: Payer: Self-pay

## 2018-04-28 ENCOUNTER — Encounter: Payer: Self-pay | Admitting: Pharmacist

## 2018-04-28 ENCOUNTER — Other Ambulatory Visit: Payer: Self-pay | Admitting: Pharmacist

## 2018-04-28 DIAGNOSIS — E1140 Type 2 diabetes mellitus with diabetic neuropathy, unspecified: Secondary | ICD-10-CM

## 2018-04-28 DIAGNOSIS — I480 Paroxysmal atrial fibrillation: Secondary | ICD-10-CM

## 2018-04-28 DIAGNOSIS — Z794 Long term (current) use of insulin: Principal | ICD-10-CM

## 2018-04-28 DIAGNOSIS — Z7901 Long term (current) use of anticoagulants: Secondary | ICD-10-CM

## 2018-04-28 NOTE — Telephone Encounter (Signed)
Requesting to speak with [name]. Please call pt back.  

## 2018-04-28 NOTE — Progress Notes (Signed)
   April 27, 2018  During the coronavirus outbreak, [facility] and the [facility] of Pharmacists Dr. Jorene Guest and Dr. Kristie Cowman to be "right here with you." In an abundance of caution, to reduce your risk of exposure to the coronavirus and to help you and your family remain healthy and safe, effective April 27, 2018, and continuing until further notice, the Anticoagulation Management Clinic will suspend our clinic appointments within the [facility] Clinic of [facility].  On the designated day/date and time of any previously scheduled appointment-or any appointment called to you, you are asked to report to [facility], located in the [facility] at [address], [place], [place] [address]. The phone number for this location is [phone]. The hours at this laboratory testing site are: 7:30 am - 12:30 pm, reopens at 1:30 pm - 4:30 pm.  . Your INR test will be collected by traditional venous sampling and processed within their central laboratory.  . While awaiting results and your phone call from Dr. Elie Confer or Dr. [name], continue to take the dosage of warfarin as was last provided or instructed.  Marland Kitchen Results, when available, will be shared with Dr. Elie Confer or Dr. [name], who will provide phone follow-up with you to discuss your INR test value and any dosing changes to your warfarin/Coumadin therapy as necessary.  As always has been the case, should you experience any signs of bleeding or clotting, e.g.: . Blood in urine     . Blood in stool . Coughing up blood . Throwing up blood . Nose bleeds . Excessive bruising . Pain in your lower leg or chest . Signs or symptoms suggestive of "stroke," e.g., loss of balance, visual disturbances,    facial drooping, weakness of your arm(s) or leg(s), slurred speech: . CALL 911 and get to the hospital fast.  If you have questions, please contact the Internal Medicine Clinic,  [phone]  Thank you! Jorene Guest, PharmD, CPP Mannie Stabile, PharmD, CPP

## 2018-04-28 NOTE — Telephone Encounter (Signed)
Tc to pt, she was feeling down, reassured her

## 2018-04-29 ENCOUNTER — Other Ambulatory Visit: Payer: Self-pay

## 2018-04-29 NOTE — Patient Outreach (Signed)
[facility]) Care Management  04/29/2018   [name] [birth year] [id number]  Subjective: Successful outreach to the patient for assessment.  HIPAA verified. The patient states that she is doing ok.  She denies  Any chest pain or swelling.  She states that she does have shortness of breath but it is not unusual. She states that she has had two falls.  The first happened when she sat on the edge of the bed and she does not know how but she ended up on the floor.  The second time she tripped in the bathroom.  Discussed with the patient about fall precaution and being aware of her surroundings.  She verbalized understanding.  The patient states that she did not weigh today but yesterday  She was 177 lbs.  She states that she does not weigh everyday.  Encouraged the patient to weigh daily and record her values.  She verbalized understanding.  She states that she is taking her medications and monitoring her diet.  She states that she has some appointments coming up but not sure if she will go. Advised the patient to call and talk with the doctors office to see what there protocol will be. She verbalized understanding.   Current Medications:  Current Outpatient Medications  Medication Sig Dispense Refill  . albuterol (PROAIR HFA) 108 (90 Base) MCG/ACT inhaler Inhale 2 puffs into the lungs every 6 (six) hours as needed for shortness of breath. 3 Inhaler 3  . ALPRAZolam (XANAX) 1 MG tablet Take 1 tablet (1 mg) by mouth at bedtime as needed for insomnia.  May take 1/2 tablet (0.5 mg) by mouth during the day for anxiety. 55 tablet 5  . benzonatate (TESSALON) 100 MG capsule Take 1 capsule (100 mg total) by mouth 3 (three) times daily as needed for cough. 90 capsule 11  . Blood Glucose Monitoring Suppl (ONETOUCH VERIO) W/DEVICE KIT 1 each by Does not apply route 4 (four) times daily. 1 kit 0  . buPROPion (WELLBUTRIN) 75 MG tablet Take 1 tablet (75 mg total) by mouth 2 (two) times daily.  180 tablet 3  . chlorpheniramine (CHLOR-TRIMETON) 4 MG tablet Take 4 mg by mouth daily as needed for allergies.    . clobetasol cream (TEMOVATE) 6.19 % Apply 1 application topically 2 (two) times daily as needed (lichen sclerosus flare). 45 g 3  . DULoxetine (CYMBALTA) 60 MG capsule Take 1 capsule (60 mg total) by mouth 2 (two) times daily. 180 capsule 3  . ferrous gluconate (FERGON) 324 MG tablet Take 1 tablet (324 mg total) by mouth 2 (two) times daily with a meal. (Patient taking differently: Take 648 mg by mouth See admin instructions. Take 2 tablets (648 mg) by mouth daily with lunch or supper) 180 tablet 3  . fluticasone (FLONASE) 50 MCG/ACT nasal spray Place 2 sprays into both nostrils daily. 16 g 11  . Fluticasone-Salmeterol (ADVAIR DISKUS) 500-50 MCG/DOSE AEPB Inhale 1 puff into the lungs 2 (two) times daily. 180 each 3  . furosemide (LASIX) 80 MG tablet Take 1 tablet (80 mg total) by mouth daily. 45 tablet 2  . gabapentin (NEURONTIN) 300 MG capsule Take 2 capsules (600 mg total) by mouth 3 (three) times daily. (Patient taking differently: Take 900 mg by mouth 2 (two) times daily. ) 540 capsule 3  . glucose blood (ONETOUCH VERIO) test strip Use to check blood sugar 4 times daily. diag code E11.40. Insulin dependent 375 each 1  . [START ON 06/23/2018] HYDROcodone-acetaminophen (  NORCO/VICODIN) 5-325 MG tablet Take 1-2 tablets by mouth every 6 (six) hours as needed for severe pain. 115 tablet 0  . ibuprofen (ADVIL,MOTRIN) 800 MG tablet Take 1 tablet (800 mg total) by mouth every 8 (eight) hours as needed. 15 tablet 0  . insulin aspart (NOVOLOG) 100 UNIT/ML FlexPen INJECT 15 UNITS SUBCUTANEOUSLY 4 TIMES DAILY, BEFORE MEALS AND AT BEDTIME 15 pen 4  . Insulin Pen Needle 32G X 4 MM MISC Use to inject insulin up to 6 times a day 500 each 3  . Insulin Syringe-Needle U-100 31G X 15/64" 0.5 ML MISC Use to inject insulin up to 4 times a day 400 each 3  . ipratropium-albuterol (DUONEB) 0.5-2.5 (3) MG/3ML  SOLN INHALE THE CONTENTS OF 1 VIAL VIA NEBULIZER EVERY 6 HOURS AS NEEDED FOR SHORTNESS OF BREATH 360 mL 3  . Lancets Misc. (ACCU-CHEK FASTCLIX LANCET) KIT Check your blood 4 times a day dx code 250.00 insulin requiring 1 kit 2  . liraglutide (VICTOZA) 18 MG/3ML SOPN Inject 0.3 mLs (1.8 mg total) into the skin daily. 3 pen 11  . metoprolol succinate (TOPROL-XL) 50 MG 24 hr tablet Take 1 tablet (50 mg total) by mouth daily. 90 tablet 3  . nicotine (NICODERM CQ - DOSED IN MG/24 HOURS) 14 mg/24hr patch Place 1 patch (14 mg total) onto the skin daily. 28 patch 0  . nystatin cream (MYCOSTATIN) Apply 1 application topically 2 (two) times daily as needed (rash).   3  . omeprazole (PRILOSEC) 40 MG capsule Take 1 capsule (40 mg total) by mouth 2 (two) times daily. 180 capsule 3  . ondansetron (ZOFRAN) 4 MG tablet Take 1 tablet (4 mg total) by mouth every 8 (eight) hours as needed for nausea or vomiting. 20 tablet 0  . OXYGEN Inhale 2 L into the lungs as needed (for shortness of breath while active).     . potassium chloride SA (K-DUR,KLOR-CON) 20 MEQ tablet Take 1 tablet (20 mEq total) by mouth 2 (two) times daily. 180 tablet 3  . PRESCRIPTION MEDICATION Inhale into the lungs at bedtime. CPAP    . rosuvastatin (CRESTOR) 20 MG tablet Take 1 tablet (20 mg total) by mouth at bedtime. 90 tablet 3  . tiotropium (SPIRIVA HANDIHALER) 18 MCG inhalation capsule Place 1 capsule (18 mcg total) into inhaler and inhale daily. 90 capsule 3  . warfarin (COUMADIN) 2.5 MG tablet TAKE 1 TABLET BY MOUTH DAILY AT 6 PM EXCEPT ON MONDAYS 30 tablet 1   Current Facility-Administered Medications  Medication Dose Route Frequency Provider Last Rate Last Dose  . senna-docusate (Senokot-S) tablet 1 tablet  1 tablet Oral QHS PRN Valinda Party, DO        Functional Status:  In your present state of health, do you have any difficulty performing the following activities: 03/09/2018 02/19/2018  Hearing? N N  Vision? N N   Difficulty concentrating or making decisions? Y Y  Comment - "sometimes"  Walking or climbing stairs? Y Y  Comment - "I don't climb stairs; not good at walking either"  Dressing or bathing? Y Y  Doing errands, shopping? Y Y  Comment - "I don't drive"  Preparing Food and eating ? - -  Using the Toilet? - -  In the past six months, have you accidently leaked urine? - -  Do you have problems with loss of bowel control? - -  Managing your Medications? - -  Managing your Finances? - -  Housekeeping or managing your Housekeeping? - -  Some recent data might be hidden    Fall/Depression Screening: Fall Risk  04/29/2018 03/09/2018 02/19/2018  Falls in the past year? 1 - 1  Comment - - -  Number falls in past yr: _0 Comment - - -  Injury with Fall? _1 Comment slid of of the edge of the bed 1st time and second time tripped in the bath room - -  Risk Factor Category  - High Risk (2 or more Points) High Risk (2 or more Points)  Risk for fall due to : Impaired balance/gait History of fall(s);Impaired balance/gait -  Risk for fall due to: Comment - - -  Follow up Education provided Falls evaluation completed;Education provided;Falls prevention discussed -  Comment - - -   PHQ 2/9 Scores 03/09/2018 02/19/2018 01/15/2018 01/15/2018 12/18/2017 12/15/2017 11/20/2017  PHQ - 2 Score _2 PHQ- 9 Score _3 - -  Some recent data might be hidden    Assessment: Patient will continue to benefit from health coach outreach for disease management and support. [facility]N CM Care Plan Problem One     Most Recent Value  [facility]N Long Term Goal   In 60 days the patient will reprot that she has not had any ed visit or hospital admissions for COPD exacerbation  THN Long Term Goal Start Date  04/29/18  Interventions for Problem One Long Term Goal  Reviewed signs and symptoms of heart failure , encouraged patient to weigh daily, verified with patient correct dose of fluid pills she should be taking  from his last appointment, encouraged medication compliance.  Advised patient to avoid people who are sick, washing hands with soap and water or using hand sanitizer.       Plan: RN Health Coach will contact patient in the month of May and patient agrees to next outreach.   Lazaro Arms RN, BSN, [place] Direct Dial:  [phone]  Fax: [phone]

## 2018-05-03 ENCOUNTER — Telehealth: Payer: Self-pay | Admitting: Internal Medicine

## 2018-05-03 NOTE — Telephone Encounter (Signed)
rtc to pt, reassured, call ended

## 2018-05-03 NOTE — Telephone Encounter (Signed)
Pt is requesting for Bonnita Nasuti to call her back; pt contact [phone]

## 2018-05-09 ENCOUNTER — Encounter: Payer: Self-pay | Admitting: *Deleted

## 2018-05-10 ENCOUNTER — Telehealth: Payer: Self-pay

## 2018-05-10 NOTE — Telephone Encounter (Signed)
Requesting to speak with Bonnita Nasuti. Please call back.

## 2018-05-13 NOTE — Telephone Encounter (Signed)
Rtc, reassured. Concerned about dr Eppie Gibson, COVID and the world.

## 2018-05-14 ENCOUNTER — Encounter: Payer: PPO | Admitting: Internal Medicine

## 2018-05-17 ENCOUNTER — Telehealth: Payer: Self-pay | Admitting: Internal Medicine

## 2018-05-17 NOTE — Telephone Encounter (Signed)
    Reason for call:   I received a call from [name] at 1146 PM indicating that she was experiencing severe heartburn.   Pertinent Data:  4 hours of constant burning upper abdominal and lower chest pain that began after eating a large heavy meal and then lying down to sleep. She stated that the pain is similar to what she described as heart burn for which she takes a PPI. She has had this pain before, it is not worse with standing or walking, not associated with nausea, vomiting, diaphoresis, dyspnea nor visual changes. It did not notably respond to a single dose of Mylanta after 15 minutes. Belching relives the pain for a few seconds, sitting upright improves her symptoms slightly. She has not tried nor does she have any other medications to treat heartburn nor does she have a care to transport herself to a pharmacy to purchase an antiacid if one were to be open. She has endorsed similar but not identical symptoms of recent that have resolved on their own in the evenings.    Assessment / Plan / Recommendations:   This appears to be acid reflux from long standing GERD with an acute flare due to a large fatty meal. I can not rule out an acute coronary syndrome given her active medical conditions and smoking history.  I have advised her to seek further medical assistance if the pain fails to improve with antiacids. Given her history, she is at moderate risk of ACS although this would be truly atypical of that based on her description and our phone conversation. She does not feel this is anything more than dyspepsia but "worries at baseline".   She will monitor her symptoms and if no improvement may be forced to call 911 to transport her to the ER as she does not have her own form of transportation.   As always, pt is advised that if symptoms worsen or new symptoms arise, they should go to an urgent care facility or to to ER for further evaluation.   [name], MD   05/18/2018,  12:03 AM

## 2018-05-20 ENCOUNTER — Other Ambulatory Visit: Payer: Self-pay | Admitting: *Deleted

## 2018-05-20 ENCOUNTER — Telehealth: Payer: Self-pay | Admitting: Dietician

## 2018-05-20 DIAGNOSIS — I480 Paroxysmal atrial fibrillation: Secondary | ICD-10-CM

## 2018-05-20 NOTE — Telephone Encounter (Signed)
Medicine Attending: Reviewed and I agree with plan. Thanks DrG

## 2018-05-20 NOTE — Telephone Encounter (Signed)
what have I done with my blood sugar maybe I am out of control, hot flashes, blood sugar up and down , wonders if they are related, sugar 138, but feels like she's going to have  Self monitoring- Lowest 74,  Highest none over 200 in the last two days,. But still has hot flashes.  Wednesday [phone] 180-275 Meds- novolog- <150 takes 15 plus correction 5 for each 50, takes about half 15 units and half 20 units, today has taken nov 2x- 15  Units both times didn't check one time was 138 the other time, took 15 units victoza-1.8 mg per day Diet- meals on wheels 5 frozen, 1 hot/week, sister brings her food  Suggest- Continuous glucose monitoring(CGM);either professional or personal. She qualifies for both. Talked to [facility] about a personal CGM, asked her to watch videos online, let us know if she wants a prescription, uses [facility] and [place], consider off of novolog insulin Debera Lat, RD 05/20/2018 9:23 AM.

## 2018-05-21 MED ORDER — WARFARIN SODIUM 2.5 MG PO TABS: 2.5000 mg | ORAL_TABLET | Freq: Every day | ORAL | 0 refills | Status: DC

## 2018-05-21 NOTE — Telephone Encounter (Signed)
Can one of the pharmacists reach out to her Monday.   She may need an INR check soon (last in January).   Thanks!

## 2018-05-24 NOTE — Telephone Encounter (Signed)
She was mailed our letter indicating she would need to go to [facility] for her INR's until this is all over. I will call her and tell her she needs to go and have an INR performed at [facility] (for [place] to continue to authorize her warfarin refills).

## 2018-05-25 ENCOUNTER — Telehealth: Payer: Self-pay | Admitting: Internal Medicine

## 2018-05-25 ENCOUNTER — Other Ambulatory Visit (HOSPITAL_COMMUNITY): Payer: Self-pay | Admitting: Cardiology

## 2018-05-25 DIAGNOSIS — I5032 Chronic diastolic (congestive) heart failure: Secondary | ICD-10-CM

## 2018-05-25 NOTE — Telephone Encounter (Signed)
Pt requesting Bonnita Nasuti to give her a callback [phone]

## 2018-05-25 NOTE — Telephone Encounter (Signed)
Rtc, she states it is her wellness call for the week, she was reassured about the COVID 19 situation and the change in her pcp

## 2018-05-28 ENCOUNTER — Telehealth: Payer: Self-pay | Admitting: *Deleted

## 2018-05-28 ENCOUNTER — Other Ambulatory Visit: Payer: Self-pay | Admitting: *Deleted

## 2018-05-28 DIAGNOSIS — F419 Anxiety disorder, unspecified: Secondary | ICD-10-CM

## 2018-05-28 DIAGNOSIS — K449 Diaphragmatic hernia without obstruction or gangrene: Secondary | ICD-10-CM

## 2018-05-28 DIAGNOSIS — K219 Gastro-esophageal reflux disease without esophagitis: Secondary | ICD-10-CM

## 2018-05-28 MED ORDER — ONDANSETRON HCL 4 MG PO TABS: 4.0000 mg | ORAL_TABLET | Freq: Three times a day (TID) | ORAL | 0 refills | Status: DC | PRN

## 2018-05-28 MED ORDER — ALPRAZOLAM 1 MG PO TABS: ORAL_TABLET | ORAL | 5 refills | Status: DC

## 2018-05-28 MED ORDER — OMEPRAZOLE 40 MG PO CPDR: 40.0000 mg | DELAYED_RELEASE_CAPSULE | Freq: Two times a day (BID) | ORAL | 3 refills | Status: DC

## 2018-05-28 NOTE — Telephone Encounter (Signed)
-----  Message from Sid Falcon, MD sent at 05/28/2018 12:10 PM EDT ----- I think Ms. Leveque's Xanax may have listed as called in.   Could one of you check and call it in if needed.   Sorry about that...clicking too quickly through that one.   Gilles Chiquito, MD

## 2018-05-28 NOTE — Telephone Encounter (Signed)
Xanax 1 mg Take 1 tablet (1 mg) by mouth at bedtime as needed for insomnia. May take 1/2 tablet (0.5 mg) by mouth during the day for anxiety - called to Sharon at [employer]

## 2018-05-30 DIAGNOSIS — J449 Chronic obstructive pulmonary disease, unspecified: Secondary | ICD-10-CM | POA: Diagnosis not present

## 2018-05-30 DIAGNOSIS — I050 Rheumatic mitral stenosis: Secondary | ICD-10-CM | POA: Diagnosis not present

## 2018-05-30 DIAGNOSIS — I509 Heart failure, unspecified: Secondary | ICD-10-CM | POA: Diagnosis not present

## 2018-06-02 ENCOUNTER — Other Ambulatory Visit: Payer: Self-pay | Admitting: *Deleted

## 2018-06-02 ENCOUNTER — Other Ambulatory Visit: Payer: Self-pay

## 2018-06-02 DIAGNOSIS — Z794 Long term (current) use of insulin: Principal | ICD-10-CM

## 2018-06-02 DIAGNOSIS — E1140 Type 2 diabetes mellitus with diabetic neuropathy, unspecified: Secondary | ICD-10-CM

## 2018-06-02 MED ORDER — INSULIN ASPART 100 UNIT/ML FLEXPEN: PEN_INJECTOR | SUBCUTANEOUS | 4 refills | Status: DC

## 2018-06-02 NOTE — Telephone Encounter (Signed)
Requesting to speak with [name] about meds. Please call pt back.

## 2018-06-02 NOTE — Telephone Encounter (Signed)
rtc to pt, sent new request

## 2018-06-11 ENCOUNTER — Telehealth: Payer: Self-pay | Admitting: Internal Medicine

## 2018-06-11 NOTE — Telephone Encounter (Signed)
Pt requesting a nurse to call back.

## 2018-06-14 NOTE — Telephone Encounter (Signed)
Returned call to patient. States she wanted to make appt to meet new PCP. Appt given for 07/15/2018 at 1045. Explained that she will receive a call prior to this appt if it will be done by telehealth 2/2 Covid restrictions. Patient states understanding. Hubbard Hartshorn, RN, BSN

## 2018-06-22 ENCOUNTER — Other Ambulatory Visit: Payer: Self-pay | Admitting: Internal Medicine

## 2018-06-23 ENCOUNTER — Telehealth: Payer: Self-pay | Admitting: Internal Medicine

## 2018-06-23 NOTE — Telephone Encounter (Signed)
I have inherited her from Dr. Eppie Gibson.  I looked at his problem list and most recent office visit note and there is no indication for almost daily antinausea medication.  My appointment with her is not until June 4.  Please schedule an [facility] telephone appointment to discuss nausea and indications for frequent Zofran.

## 2018-06-23 NOTE — Telephone Encounter (Signed)
Spoke with patient. She declined [facility] telehealth appt. She says she has enough zofran to last till her appt with new PCP on 07/15/2018. Also requesting refill on nystatin cream. Explained that this would also require speaking with provider as it has not been refilled by our office since 12/2016. States she has been getting this refilled by her GYN and will contact that office for refill. Patient hasn't decided if she will come to clinic on 6/4. Explained that this can be done as telehealth. She will call back and let us know her preference closer to the date. Hubbard Hartshorn, RN, BSN

## 2018-06-24 ENCOUNTER — Telehealth: Payer: Self-pay | Admitting: *Deleted

## 2018-06-24 ENCOUNTER — Other Ambulatory Visit: Payer: Self-pay | Admitting: *Deleted

## 2018-06-24 DIAGNOSIS — J439 Emphysema, unspecified: Secondary | ICD-10-CM

## 2018-06-24 DIAGNOSIS — E785 Hyperlipidemia, unspecified: Secondary | ICD-10-CM

## 2018-06-24 MED ORDER — TIOTROPIUM BROMIDE MONOHYDRATE 18 MCG IN CAPS: 18.0000 ug | ORAL_CAPSULE | Freq: Every day | RESPIRATORY_TRACT | 3 refills | Status: DC

## 2018-06-24 MED ORDER — ROSUVASTATIN CALCIUM 20 MG PO TABS: 20.0000 mg | ORAL_TABLET | Freq: Every day | ORAL | 3 refills | Status: DC

## 2018-06-24 NOTE — Telephone Encounter (Signed)
Pt called for reassurance, given, ended call

## 2018-06-29 ENCOUNTER — Other Ambulatory Visit: Payer: Self-pay

## 2018-06-29 DIAGNOSIS — J449 Chronic obstructive pulmonary disease, unspecified: Secondary | ICD-10-CM | POA: Diagnosis not present

## 2018-06-29 DIAGNOSIS — I050 Rheumatic mitral stenosis: Secondary | ICD-10-CM | POA: Diagnosis not present

## 2018-06-29 DIAGNOSIS — I509 Heart failure, unspecified: Secondary | ICD-10-CM | POA: Diagnosis not present

## 2018-06-29 NOTE — Patient Outreach (Signed)
[facility]) Care Management  06/29/2018   [name] Dec 21, 1950 [id number]  Subjective: Successful outreach.  Two patient identifiers obtained.  The patient denies any swelling.  She states that she does have some pains that go through her chest mainly at night. Advised the patient to notify her physician of her situation.  She verbalized understanding.  She states that she has had one fall since we last talked but unsure when.  She was in her bedroom and states she "got turned around". She fell and hit her head.  She had a small egg on her forehead.  She states that she did call her physician and let them know. In a couple of days the egg went away with no residual affects  She states that she had not weighed this morning yet but yesterday her weight was 179 lbs.  She states that her blood sugar this morning was 99.  Reviewed her medications and she is taking them as prescribed.  She is monitoring her diet.  The patient states that she has an appointment on 6/4 with her new physician Dr. Lynnae January.    Current Medications:  Current Outpatient Medications  Medication Sig Dispense Refill  . albuterol (PROAIR HFA) 108 (90 Base) MCG/ACT inhaler Inhale 2 puffs into the lungs every 6 (six) hours as needed for shortness of breath. 3 Inhaler 3  . ALPRAZolam (XANAX) 1 MG tablet Take 1 tablet (1 mg) by mouth at bedtime as needed for insomnia.  May take 1/2 tablet (0.5 mg) by mouth during the day for anxiety. 55 tablet 5  . benzonatate (TESSALON) 100 MG capsule Take 1 capsule (100 mg total) by mouth 3 (three) times daily as needed for cough. 90 capsule 11  . Blood Glucose Monitoring Suppl (ONETOUCH VERIO) W/DEVICE KIT 1 each by Does not apply route 4 (four) times daily. 1 kit 0  . buPROPion (WELLBUTRIN) 75 MG tablet Take 1 tablet (75 mg total) by mouth 2 (two) times daily. 180 tablet 3  . chlorpheniramine (CHLOR-TRIMETON) 4 MG tablet Take 4 mg by mouth daily as needed for allergies.     . clobetasol cream (TEMOVATE) 8.14 % Apply 1 application topically 2 (two) times daily as needed (lichen sclerosus flare). 45 g 3  . DULoxetine (CYMBALTA) 60 MG capsule Take 1 capsule (60 mg total) by mouth 2 (two) times daily. 180 capsule 3  . ferrous gluconate (FERGON) 324 MG tablet Take 1 tablet (324 mg total) by mouth 2 (two) times daily with a meal. (Patient taking differently: Take 648 mg by mouth See admin instructions. Take 2 tablets (648 mg) by mouth daily with lunch or supper) 180 tablet 3  . fluticasone (FLONASE) 50 MCG/ACT nasal spray Place 2 sprays into both nostrils daily. 16 g 11  . Fluticasone-Salmeterol (ADVAIR DISKUS) 500-50 MCG/DOSE AEPB Inhale 1 puff into the lungs 2 (two) times daily. 180 each 3  . furosemide (LASIX) 80 MG tablet TAKE 1 TABLET BY MOUTH ONCE DAILY 90 tablet 0  . gabapentin (NEURONTIN) 300 MG capsule Take 2 capsules (600 mg total) by mouth 3 (three) times daily. (Patient taking differently: Take 900 mg by mouth 2 (two) times daily. ) 540 capsule 3  . glucose blood (ONETOUCH VERIO) test strip Use to check blood sugar 4 times daily. diag code E11.40. Insulin dependent 375 each 1  . HYDROcodone-acetaminophen (NORCO/VICODIN) 5-325 MG tablet Take 1-2 tablets by mouth every 6 (six) hours as needed for severe pain. 115 tablet 0  .  ibuprofen (ADVIL,MOTRIN) 800 MG tablet Take 1 tablet (800 mg total) by mouth every 8 (eight) hours as needed. 15 tablet 0  . insulin aspart (NOVOLOG) 100 UNIT/ML FlexPen INJECT 15 UNITS TO 25 UNITS SUBCUTANEOUSLY 4 TIMES DAILY BEFORE MEALS AND AT BEDTIME 15 mL 4  . Insulin Pen Needle 32G X 4 MM MISC Use to inject insulin up to 6 times a day 500 each 3  . Insulin Syringe-Needle U-100 31G X 15/64" 0.5 ML MISC Use to inject insulin up to 4 times a day 400 each 3  . ipratropium-albuterol (DUONEB) 0.5-2.5 (3) MG/3ML SOLN INHALE THE CONTENTS OF 1 VIAL VIA NEBULIZER EVERY 6 HOURS AS NEEDED FOR SHORTNESS OF BREATH 360 mL 3  . Lancets Misc. (ACCU-CHEK  FASTCLIX LANCET) KIT Check your blood 4 times a day dx code 250.00 insulin requiring 1 kit 2  . liraglutide (VICTOZA) 18 MG/3ML SOPN Inject 0.3 mLs (1.8 mg total) into the skin daily. 3 pen 11  . metoprolol succinate (TOPROL-XL) 50 MG 24 hr tablet Take 1 tablet (50 mg total) by mouth daily. 90 tablet 3  . nicotine (NICODERM CQ - DOSED IN MG/24 HOURS) 14 mg/24hr patch Place 1 patch (14 mg total) onto the skin daily. 28 patch 0  . nystatin cream (MYCOSTATIN) Apply 1 application topically 2 (two) times daily as needed (rash).   3  . omeprazole (PRILOSEC) 40 MG capsule Take 1 capsule (40 mg total) by mouth 2 (two) times daily. 180 capsule 3  . ondansetron (ZOFRAN) 4 MG tablet Take 1 tablet (4 mg total) by mouth every 8 (eight) hours as needed for nausea or vomiting. 20 tablet 0  . OXYGEN Inhale 2 L into the lungs as needed (for shortness of breath while active).     . potassium chloride SA (K-DUR,KLOR-CON) 20 MEQ tablet Take 1 tablet (20 mEq total) by mouth 2 (two) times daily. 180 tablet 3  . PRESCRIPTION MEDICATION Inhale into the lungs at bedtime. CPAP    . rosuvastatin (CRESTOR) 20 MG tablet Take 1 tablet (20 mg total) by mouth at bedtime. 90 tablet 3  . tiotropium (SPIRIVA HANDIHALER) 18 MCG inhalation capsule Place 1 capsule (18 mcg total) into inhaler and inhale daily. 90 capsule 3  . warfarin (COUMADIN) 2.5 MG tablet Take 1 tablet (2.5 mg total) by mouth daily at 6 PM. Every day except mondays 90 tablet 0   Current Facility-Administered Medications  Medication Dose Route Frequency Provider Last Rate Last Dose  . senna-docusate (Senokot-S) tablet 1 tablet  1 tablet Oral QHS PRN Valinda Party, DO        Functional Status:  In your present state of health, do you have any difficulty performing the following activities: 03/09/2018 02/19/2018  Hearing? N N  Vision? N N  Difficulty concentrating or making decisions? Y Y  Comment - "sometimes"  Walking or climbing stairs? Y Y  Comment  - "I don't climb stairs; not good at walking either"  Dressing or bathing? Y Y  Doing errands, shopping? Y Y  Comment - "I don't drive"  Preparing Food and eating ? - -  Using the Toilet? - -  In the past six months, have you accidently leaked urine? - -  Do you have problems with loss of bowel control? - -  Managing your Medications? - -  Managing your Finances? - -  Housekeeping or managing your Housekeeping? - -  Some recent data might be hidden    Fall/Depression Screening: Fall Risk  06/29/2018 04/29/2018 03/09/2018  Falls in the past year? 1 1 -  Comment - - -  Number falls in past yr: 0 1 1  Comment - - -  Injury with Fall? _0 Comment patient states that she got turned around in her bedroom not surehow and fell lon her face .  she states that she had a egg on her forhead  but no symptoms.  she called her physician and notified them. slid of of the edge of the bed 1st time and second time tripped in the bath room -  Risk Factor Category  - - High Risk (2 or more Points)  Risk for fall due to : Impaired balance/gait Impaired balance/gait History of fall(s);Impaired balance/gait  Risk for fall due to: Comment - - -  Follow up Education provided Education provided Falls evaluation completed;Education provided;Falls prevention discussed  Comment - - -   PHQ 2/9 Scores 03/09/2018 02/19/2018 01/15/2018 01/15/2018 12/18/2017 12/15/2017 11/20/2017  PHQ - 2 Score _1 PHQ- 9 Score _2 - -  Some recent data might be hidden    Assessment: Patient will continue to benefit from health coach outreach for disease management and support. [facility] CM Care Plan Problem One     Most Recent Value  [facility] Long Term Goal   In 90 days the patient will reprot that she has not had any ed visit or hospital admissions for COPD exacerbation  [facility] Long Term Goal Start Date  06/29/18  Interventions for Problem One Long Term Goal  Dicusse diet, reviewed signs and symptoms of heart failure action  plan, reviewed medications and encouraged medication adherence, and encouraged daily weights.     Plan: RN Health Coach will contact patient in the month of August and patient agrees to next outreach.   Lazaro Arms RN, BSN, [place] Direct Dial:  [phone]  Fax: [phone]

## 2018-07-13 DIAGNOSIS — I272 Pulmonary hypertension, unspecified: Secondary | ICD-10-CM | POA: Insufficient documentation

## 2018-07-13 DIAGNOSIS — F418 Other specified anxiety disorders: Secondary | ICD-10-CM | POA: Insufficient documentation

## 2018-07-13 DIAGNOSIS — J449 Chronic obstructive pulmonary disease, unspecified: Secondary | ICD-10-CM | POA: Insufficient documentation

## 2018-07-13 DIAGNOSIS — I10 Essential (primary) hypertension: Secondary | ICD-10-CM | POA: Insufficient documentation

## 2018-07-13 DIAGNOSIS — L900 Lichen sclerosus et atrophicus: Secondary | ICD-10-CM | POA: Diagnosis not present

## 2018-07-13 DIAGNOSIS — E119 Type 2 diabetes mellitus without complications: Secondary | ICD-10-CM | POA: Insufficient documentation

## 2018-07-15 ENCOUNTER — Encounter: Payer: Self-pay | Admitting: Internal Medicine

## 2018-07-15 ENCOUNTER — Ambulatory Visit (INDEPENDENT_AMBULATORY_CARE_PROVIDER_SITE_OTHER): Payer: PPO | Admitting: Internal Medicine

## 2018-07-15 ENCOUNTER — Ambulatory Visit: Payer: PPO | Admitting: Pharmacist

## 2018-07-15 ENCOUNTER — Other Ambulatory Visit: Payer: Self-pay

## 2018-07-15 VITALS — BP 131/53 | HR 73 | Temp 98.6°F | Wt 182.7 lb

## 2018-07-15 DIAGNOSIS — M797 Fibromyalgia: Secondary | ICD-10-CM

## 2018-07-15 DIAGNOSIS — I480 Paroxysmal atrial fibrillation: Secondary | ICD-10-CM | POA: Diagnosis not present

## 2018-07-15 DIAGNOSIS — J439 Emphysema, unspecified: Secondary | ICD-10-CM

## 2018-07-15 DIAGNOSIS — I700 Atherosclerosis of aorta: Secondary | ICD-10-CM

## 2018-07-15 DIAGNOSIS — C642 Malignant neoplasm of left kidney, except renal pelvis: Secondary | ICD-10-CM

## 2018-07-15 DIAGNOSIS — E1140 Type 2 diabetes mellitus with diabetic neuropathy, unspecified: Secondary | ICD-10-CM

## 2018-07-15 DIAGNOSIS — K5520 Angiodysplasia of colon without hemorrhage: Secondary | ICD-10-CM | POA: Diagnosis not present

## 2018-07-15 DIAGNOSIS — I110 Hypertensive heart disease with heart failure: Secondary | ICD-10-CM

## 2018-07-15 DIAGNOSIS — N904 Leukoplakia of vulva: Secondary | ICD-10-CM

## 2018-07-15 DIAGNOSIS — Z7951 Long term (current) use of inhaled steroids: Secondary | ICD-10-CM

## 2018-07-15 DIAGNOSIS — J449 Chronic obstructive pulmonary disease, unspecified: Secondary | ICD-10-CM | POA: Diagnosis not present

## 2018-07-15 DIAGNOSIS — Z720 Tobacco use: Secondary | ICD-10-CM

## 2018-07-15 DIAGNOSIS — I2722 Pulmonary hypertension due to left heart disease: Secondary | ICD-10-CM | POA: Diagnosis not present

## 2018-07-15 DIAGNOSIS — M545 Low back pain: Secondary | ICD-10-CM

## 2018-07-15 DIAGNOSIS — R519 Headache, unspecified: Secondary | ICD-10-CM

## 2018-07-15 DIAGNOSIS — I5032 Chronic diastolic (congestive) heart failure: Secondary | ICD-10-CM | POA: Diagnosis not present

## 2018-07-15 DIAGNOSIS — R251 Tremor, unspecified: Secondary | ICD-10-CM

## 2018-07-15 DIAGNOSIS — Z8719 Personal history of other diseases of the digestive system: Secondary | ICD-10-CM

## 2018-07-15 DIAGNOSIS — G4733 Obstructive sleep apnea (adult) (pediatric): Secondary | ICD-10-CM

## 2018-07-15 DIAGNOSIS — R079 Chest pain, unspecified: Secondary | ICD-10-CM

## 2018-07-15 DIAGNOSIS — Z9989 Dependence on other enabling machines and devices: Secondary | ICD-10-CM

## 2018-07-15 DIAGNOSIS — Z7901 Long term (current) use of anticoagulants: Secondary | ICD-10-CM

## 2018-07-15 DIAGNOSIS — F321 Major depressive disorder, single episode, moderate: Secondary | ICD-10-CM

## 2018-07-15 DIAGNOSIS — I2723 Pulmonary hypertension due to lung diseases and hypoxia: Secondary | ICD-10-CM

## 2018-07-15 DIAGNOSIS — D500 Iron deficiency anemia secondary to blood loss (chronic): Secondary | ICD-10-CM

## 2018-07-15 DIAGNOSIS — F331 Major depressive disorder, recurrent, moderate: Secondary | ICD-10-CM

## 2018-07-15 DIAGNOSIS — Z794 Long term (current) use of insulin: Secondary | ICD-10-CM

## 2018-07-15 DIAGNOSIS — Z79899 Other long term (current) drug therapy: Secondary | ICD-10-CM

## 2018-07-15 DIAGNOSIS — R51 Headache: Secondary | ICD-10-CM

## 2018-07-15 DIAGNOSIS — G8929 Other chronic pain: Secondary | ICD-10-CM

## 2018-07-15 DIAGNOSIS — R269 Unspecified abnormalities of gait and mobility: Secondary | ICD-10-CM

## 2018-07-15 DIAGNOSIS — D509 Iron deficiency anemia, unspecified: Secondary | ICD-10-CM

## 2018-07-15 DIAGNOSIS — R6889 Other general symptoms and signs: Secondary | ICD-10-CM

## 2018-07-15 DIAGNOSIS — L900 Lichen sclerosus et atrophicus: Secondary | ICD-10-CM

## 2018-07-15 DIAGNOSIS — F419 Anxiety disorder, unspecified: Secondary | ICD-10-CM

## 2018-07-15 DIAGNOSIS — I050 Rheumatic mitral stenosis: Secondary | ICD-10-CM | POA: Diagnosis not present

## 2018-07-15 DIAGNOSIS — I10 Essential (primary) hypertension: Secondary | ICD-10-CM

## 2018-07-15 DIAGNOSIS — Z85520 Personal history of malignant carcinoid tumor of kidney: Secondary | ICD-10-CM

## 2018-07-15 DIAGNOSIS — Z79891 Long term (current) use of opiate analgesic: Secondary | ICD-10-CM

## 2018-07-15 DIAGNOSIS — F17210 Nicotine dependence, cigarettes, uncomplicated: Secondary | ICD-10-CM

## 2018-07-15 LAB — POCT GLYCOSYLATED HEMOGLOBIN (HGB A1C): Hemoglobin A1C: 6.5 % — AB (ref 4.0–5.6)

## 2018-07-15 LAB — GLUCOSE, CAPILLARY: Glucose-Capillary: 165 mg/dL — ABNORMAL HIGH (ref 70–99)

## 2018-07-15 LAB — POCT INR: INR: 2 (ref 2.0–3.0)

## 2018-07-15 MED ORDER — WARFARIN SODIUM 2.5 MG PO TABS: 2.5000 mg | ORAL_TABLET | Freq: Every day | ORAL | 0 refills | Status: DC

## 2018-07-15 NOTE — Assessment & Plan Note (Signed)
This problem is chronic and uncontrolled.  I reviewed Dr. Caroline More January note which indicated that the headache they discussed in January was fairly new.  She had a CT and an MRI which was negative for any etiology and he then planned on a sleep study and if that did not show an etiology that it was likely a medication overuse headache.  Today she stated that this headache is chronic and was not new in January and that she had a headache as we were talking.  As this is my first appointment, I am not making any changes and will continue to follow her headaches.  PLAN : Follow

## 2018-07-15 NOTE — Assessment & Plan Note (Signed)
This problem is chronic and uncontrolled.  She complains of her routine from head to toe every day.  She is on Vicodin 5/325 and states that she takes 2 BID sometimes TID.  She asked about the quantity and I did a quick chart review and her quantity has been varying quite a bit.  Most recently she got 115 which she states is not correct.  I need to do a more thorough chart review to see what triggered the quantity change.  She also will need a urine drug screen at her next appointment.  PLAN:  Cont current meds Chart review regarding her hydrocodone quantity. UDS next appointment

## 2018-07-15 NOTE — Assessment & Plan Note (Signed)
This problem is chronic and stable.  She had a mitral valve replacement and is now on warfarin with an INR goal of 1.5-2.  This is managed by our clinical pharmacist and her INR is at goal today.  PLAN : Continue warfarin with INR goal of 1.5-2

## 2018-07-15 NOTE — Assessment & Plan Note (Signed)
This problem is chronic and stable.  She is on Victoza 1.8 and NovoLog 15 to 25 units before her 4 daily meals.  She checks her sugar randomly throughout the day averaging about once a day.  Her lowest recorded value was 69.  The majority of her readings were between 80 and 180 other her knees are only states that 36% were within target.  Her average glucose is 149.  Her A1c today was 6.5 and was 6.3 in January.  Overall I think she has very good control of her diabetes and I am not making any changes today.  PLAN:  Cont current meds

## 2018-07-15 NOTE — Progress Notes (Signed)
Patient was seen today in a co-visit, see Dr. Zenovia Jarred documentation for details.

## 2018-07-15 NOTE — Assessment & Plan Note (Signed)
This problem is chronic and stable.  Her weight is down 2 pounds since January today.  She is on Lasix 80 a day and takes it most days.  She did not appear to be volume overloaded today.  PLAN:  Cont current meds

## 2018-07-15 NOTE — Assessment & Plan Note (Signed)
This problem is chronic and stable.  She is on Spiriva, DuoNeb, albuterol as needed, and Advair.  I just reviewed Dr. Jerrol Banana most recent note who indicated she was supposed to be on continuous oxygen and she is only using it intermittently.  He has educated her extensively in the past on the need for continuous oxygen and I will emphasize that at her next appointment.  Her O2 sat was 97% today but that was on 2 L of her oxygen.  PLAN:  Cont current meds Emphasize continuous oxygen at her next appointment, check O2 sat on room air at her next appointment

## 2018-07-15 NOTE — Assessment & Plan Note (Signed)
This problem is chronic and stable.  She is on Toprol 50 and warfarin.  Her INR was 2.0 today which is within her goal INR range of 1.5-2.  This range was set due to small bowel AVMs and apparently a history of a GI bleed but I need to research that further.  She was in sinus rhythm today per auscultation.  PLAN:  Cont current meds

## 2018-07-15 NOTE — Assessment & Plan Note (Signed)
This problem is chronic and uncontrolled.  She is on Toprol 50 once a day and usually her blood pressure is good but recently it has been elevated.  Her repeat today was acceptable but not yet quite at goal.  PLAN:  Cont current meds    BP Readings from Last 3 Encounters:  07/15/18 (!) 131/53  02/19/18 (!) 152/84  02/19/18 (!) 154/57

## 2018-07-15 NOTE — Assessment & Plan Note (Signed)
This problem is chronic and stable.  She states that she has completed her follow-up surveillance for her clear cell renal cell carcinoma.  She states that she was released from any associated care.  PLAN : Follow

## 2018-07-15 NOTE — Assessment & Plan Note (Signed)
This problem is chronic and stable.  It was an incidental finding on a CT scan she is on Crestor 20, a high intensity statin.  She continues to smoke and is not yet ready to quit.  She had a cardiac catheterization about 2 years ago that showed noncritical stenosis.  We are managing her risk factors for atherosclerosis.  PLAN : Continue risk factor management

## 2018-07-15 NOTE — Assessment & Plan Note (Signed)
This problem is chronic and stable.  She recently saw Linda Hedges a gynecologist who is managing this condition.  PLAN : Follow gynecology's notes
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
## 2018-07-15 NOTE — Progress Notes (Signed)
Anticoagulation Management [name] is a 68 y.o. female who reports to the clinic for monitoring of warfarin treatment.    Indication: atrial fibrillation (also history of GI bleed with AVM) Duration:indefinite  Supervising physician: [name]  Anticoagulation Clinic Visit History: Patient does not report signs/symptoms of bleeding or thromboembolism   Anticoagulation Episode Summary    Current INR goal:   1.5-2.5  TTR:   83.2 % (5.1 y)  Next INR check:   08/26/2018  INR from last check:   2.0 (07/15/2018)  Weekly max warfarin dose:     Target end date:     INR check location:   Anticoagulation Clinic  Preferred lab:     Send INR reminders to:   [facility]   Indications   PAF (paroxysmal atrial fibrillation) ([place]) [I48.0] Long term current use of anticoagulant therapy [Z79.01]       Comments:         Anticoagulation Care Providers    Provider Role Specialty Phone number   Lelon Perla, MD  Cardiology [phone]     Allergies  Allergen Reactions  . Lorazepam Other (See Comments)    Patient's sister noted that ativan caused the patient to become extremely confused during hospitalization 09/2010; tolerates Xanax  . Morphine And Related Other (See Comments)    Injection site reaction  . Oxycontin [Oxycodone] Other (See Comments)    headache  . Tramadol Hcl Swelling    Ankle swelling   Medication Sig  albuterol (PROAIR HFA) 108 (90 Base) MCG/ACT inhaler Inhale 2 puffs into the lungs every 6 (six) hours as needed for shortness of breath.  ALPRAZolam (XANAX) 1 MG tablet Take 1 tablet (1 mg) by mouth at bedtime as needed for insomnia.  May take 1/2 tablet (0.5 mg) by mouth during the day for anxiety.  benzonatate (TESSALON) 100 MG capsule Take 1 capsule (100 mg total) by mouth 3 (three) times daily as needed for cough.  Blood Glucose Monitoring Suppl (ONETOUCH VERIO) W/DEVICE KIT 1 each by Does not apply route 4 (four) times daily.   buPROPion (WELLBUTRIN) 75 MG tablet Take 1 tablet (75 mg total) by mouth 2 (two) times daily.  chlorpheniramine (CHLOR-TRIMETON) 4 MG tablet Take 4 mg by mouth daily as needed for allergies.  clobetasol cream (TEMOVATE) 2.95 % Apply 1 application topically 2 (two) times daily as needed (lichen sclerosus flare).  DULoxetine (CYMBALTA) 60 MG capsule Take 1 capsule (60 mg total) by mouth 2 (two) times daily.  ferrous gluconate (FERGON) 324 MG tablet Take 1 tablet (324 mg total) by mouth 2 (two) times daily with a meal. Patient taking differently: Take 648 mg by mouth See admin instructions. Take 2 tablets (648 mg) by mouth daily with lunch or supper  fluticasone (FLONASE) 50 MCG/ACT nasal spray Place 2 sprays into both nostrils daily.  Fluticasone-Salmeterol (ADVAIR DISKUS) 500-50 MCG/DOSE AEPB Inhale 1 puff into the lungs 2 (two) times daily.  furosemide (LASIX) 80 MG tablet TAKE 1 TABLET BY MOUTH ONCE DAILY  gabapentin (NEURONTIN) 300 MG capsule Take 2 capsules (600 mg total) by mouth 3 (three) times daily. Patient taking differently: Take 900 mg by mouth 2 (two) times daily.   glucose blood (ONETOUCH VERIO) test strip Use to check blood sugar 4 times daily. diag code E11.40. Insulin dependent  HYDROcodone-acetaminophen (NORCO/VICODIN) 5-325 MG tablet Take 1-2 tablets by mouth every 6 (six) hours as needed for severe pain.  ibuprofen (ADVIL,MOTRIN) 800 MG tablet Take 1 tablet (  800 mg total) by mouth every 8 (eight) hours as needed.  insulin aspart (NOVOLOG) 100 UNIT/ML FlexPen INJECT 15 UNITS TO 25 UNITS SUBCUTANEOUSLY 4 TIMES DAILY BEFORE MEALS AND AT BEDTIME  Insulin Pen Needle 32G X 4 MM MISC Use to inject insulin up to 6 times a day  Insulin Syringe-Needle U-100 31G X 15/64" 0.5 ML MISC Use to inject insulin up to 4 times a day  ipratropium-albuterol (DUONEB) 0.5-2.5 (3) MG/3ML SOLN INHALE THE CONTENTS OF 1 VIAL VIA NEBULIZER EVERY 6 HOURS AS NEEDED FOR SHORTNESS OF BREATH  Lancets Misc.  (ACCU-CHEK FASTCLIX LANCET) KIT Check your blood 4 times a day dx code 250.00 insulin requiring  liraglutide (VICTOZA) 18 MG/3ML SOPN Inject 0.3 mLs (1.8 mg total) into the skin daily.  metoprolol succinate (TOPROL-XL) 50 MG 24 hr tablet Take 1 tablet (50 mg total) by mouth daily.  nicotine (NICODERM CQ - DOSED IN MG/24 HOURS) 14 mg/24hr patch Place 1 patch (14 mg total) onto the skin daily.  nystatin cream (MYCOSTATIN) Apply 1 application topically 2 (two) times daily as needed (rash).   omeprazole (PRILOSEC) 40 MG capsule Take 1 capsule (40 mg total) by mouth 2 (two) times daily.  ondansetron (ZOFRAN) 4 MG tablet Take 1 tablet (4 mg total) by mouth every 8 (eight) hours as needed for nausea or vomiting.  OXYGEN Inhale 2 L into the lungs as needed (for shortness of breath while active).   potassium chloride SA (K-DUR,KLOR-CON) 20 MEQ tablet Take 1 tablet (20 mEq total) by mouth 2 (two) times daily.  PRESCRIPTION MEDICATION Inhale into the lungs at bedtime. CPAP  rosuvastatin (CRESTOR) 20 MG tablet Take 1 tablet (20 mg total) by mouth at bedtime.  tiotropium (SPIRIVA HANDIHALER) 18 MCG inhalation capsule Place 1 capsule (18 mcg total) into inhaler and inhale daily.  warfarin (COUMADIN) 2.5 MG tablet Take 1 tablet (2.5 mg total) by mouth daily at 6 PM. Every day except mondays   Past Medical History:  Diagnosis Date  . Adenomatous polyps 05/14/2011   Colonoscopy (05/2011): 4 mm adenomatous polyp excised endoscopically Colonoscopy (02/2002): Adenomatous polyp excised endoscopically   . Allergic rhinitis 06/01/2012  . Anemia of chronic disease 01/01/2013  . Anxiety 07/24/2010  . Aortic atherosclerosis ([place]) 10/19/2014   Seen on CT scan, currently asymptomatic  . 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 Arthritis    "lower back; hands" (02/19/2018)  . Asymptomatic cholelithiasis 09/25/2015   Seen on CT scan 08/2015   . Carotid artery stenosis    s/p right endarterectomy (06/2010) Carotid US (07/2010):  Left: Moderate-to-severe (60-79%) calcific and non-calcific plaque origin and proximal ICA and ECA   . Chronic congestive heart failure with left ventricular diastolic dysfunction ([place]) 10/21/2010  . Chronic constipation 02/03/2011  . Chronic daily headache 01/16/2014  . Chronic headache    "weekly now" (11/13/2017)  . Chronic low back pain 10/06/2012  . Chronic venous insufficiency 08/04/2012  . Clear cell renal cell carcinoma ([place]) 07/21/2011   s/p cryoablation of left RCC in 09/2011 by [name]. Followed by [name]  [facility]) .    Marland Kitchen COPD (chronic obstructive pulmonary disease) with emphysema (HCC)    PFTs 02/2012: FEV1 0.92 (40%), ratio 69, 27% increase in FEV1 with BD, TLC 91%, severe airtrapping, DLCO49% On chronic home O2. Pulmonary rehab referral 05/2012   . Daily headache    "recently" (02/19/2018)  . Depression 11/19/2005  . Esophageal stenosis  08/08/2015   Mild, benign-appearing on EGD 06/29/2015  . Fibromyalgia 08/29/2010  . Gastroesophageal reflux disease   . History of blood transfusion    "several times"  (02/19/2018)  . History of hiatal hernia   . History of kidney stones   . Hyperlipidemia LDL goal < 100 11/20/2005  . Hypertension   . Internal hemorrhoids 08/04/2012  . Lichen sclerosus of female genitalia 01/12/2017  . Migraine    "none in years" (02/19/2018)  . Mitral stenosis    s/p Mitral valve replacement with a 27-mm pericardial porcine valve (Medtronic Mosaic valve, serial #[id number] on 09/20/10, Dr. Prescott Gum)   . Moderate to severe pulmonary hypertension ([place])    2014 TEE w PA peak pressure 46 mmHg, s/p MV replacement   . Moderately severe major depression ([place]) 11/19/2005  . Obesity (BMI 30.0-34.9) 10/23/2011  . Obstructive sleep apnea    Nocturnal polysomnography (06/2009): Moderate sleep apnea/ hypopnea syndrome , AHI 17.8 per hour with nonpositional hypopneas.  CPAP titration to 12 CWP, AHI 2.4 per hour. On nocturnal CPAP via a small resMed Quattro full-face mask with heated humidifier.   . On home oxygen therapy    "2L O2 w/CPAP when I sleep" (02/19/2018)  . OSA on CPAP    "2L O2 w/CPAP when I sleep" (02/19/2018)  . Osteoporosis    DEXA (12/09/2011): L-spine T -3.7, left hip T -1.4 DEXA (12/2004): L-spine T -2.6, left hip -0.1   . Paroxysmal atrial fibrillation ([place]) 10/22/2010   s/p Left atrial maze procedure for paroxysmal atrial fibrillation on 09/20/2010 by Dr Prescott Gum.  Subsequent splenic infarct, decision was made to re-anticoagulate with coumadin, likely life-long as this is the most likely cause of the splenic infarct.   . Pneumonia    "once"  (02/19/2018)  . Pulmonary hypertension due to chronic obstructive pulmonary disease ([place]) 04/25/2016  . Right nephrolithiasis 09/06/2014   5 mm non-obstructing calculus seen on CT scan 09/05/2014   . Seborrheic keratosis 09/28/2015  . Severe obesity (BMI 35.0-39.9) with comorbidity ([place]) 10/23/2011  . Shortness of breath dyspnea   . Tobacco abuse 07/28/2012  . Type 2 diabetes mellitus with diabetic neuropathy (HCC)    Social History   Socioeconomic History  . Marital status: Divorced    Spouse name: Not on file  . Number of children: Not on file  . Years of education: Not on file  . Highest education level: Not on file  Occupational History  . Not on file  Social Needs  . Financial resource strain: Not on file  . Food insecurity:    Worry: Not on file    Inability: Not on file  . Transportation needs:    Medical: Not on file    Non-medical: Not on file  Tobacco Use  . Smoking status: Current Every Day Smoker    Packs/day: 1.00    Years: 55.00    Pack years: 55.00    Types: Cigarettes    Start date: 11/01/1962  . Smokeless tobacco: Never Used  Substance and Sexual Activity  . Alcohol use: Not Currently    Comment: 02/19/2018 "nothing since 1999"  . Drug use: Not Currently    Types:  Cocaine, Marijuana    Comment: 02/19/2018 "nothing since the 1990s"  . Sexual activity: Not Currently    Birth control/protection: Post-menopausal  Lifestyle  . Physical activity:    Days per week: Not on file    Minutes per session: Not on file  . Stress: Not on file  Relationships  . Social connections:    Talks on phone: Not on file    Gets together: Not on file    Attends religious service: Not on file    Active member of club or organization: Not on file    Attends meetings of clubs or organizations: Not on file    Relationship status: Not on file  Other Topics Concern  . Not on file  Social History Narrative   Lives alone in [place]   Worked at [employer] for 18 years   No car   Family History  Problem Relation Age of Onset  . Peptic Ulcer Disease Father   . Heart attack Father 43       Died of MI at age 48  . Heart attack Brother 25       Died of MI at age 71  . Obesity Brother   . Pneumonia Mother   . Healthy Sister   . Lupus Daughter   . Obsessive Compulsive Disorder Daughter    ASSESSMENT Recent Results: Lab Results  Component Value Date   INR 2.0 07/15/2018   INR 2.4 02/19/2018   INR 2.6 01/15/2018   Anticoagulation Dosing: Description   Take ONE (1)  tablet of your 2.19m green-colored warfarin tablet by mouth, once-daily at 6PM all days of week, EXCEPT on Mondays. Do NOT take any warfarin on Mondays of each week.      INR today: Therapeutic  PLAN Weekly dose was unchanged  There are no Patient Instructions on file for this visit. Patient advised to contact clinic or seek medical attention if signs/symptoms of bleeding or thromboembolism occur.  Patient verbalized understanding by repeating back information and was advised to contact me if further medication-related questions arise. Patient was also provided an information handout.  Follow-up 6 weeks  JFlossie Dibble 15 minutes spent face-to-face with the patient  during the encounter. 50% of time spent on education, including signs/sx bleeding and clotting, as well as food and drug interactions with warfarin. 50% of time was spent on fingerprick POC INR sample collection,processing, results determination, and documentation in [email or web address]

## 2018-07-15 NOTE — Assessment & Plan Note (Signed)
This problem is chronic and uncontrolled.  She is on Wellbutrin and Cymbalta.  She is stable she has no interest in anything and does not want to die but would not be upset if she did.  Her goal is to hold her great granddaughter but does not know if this is going to occur due to her ongoing smoking.  She was very interactive today with appropriate eye contact and affect and speech.  She has a computer and a tablet but does not use them much.  There is a game that she likes to play on the computer.  She also hurts from head to toe every day and not gets to her.  I did not get a chance to offer a referral to [facility] that I will at her next appointment.  PLAN:  Cont current meds I have her referral to [facility] at her next appointment

## 2018-07-15 NOTE — Assessment & Plan Note (Signed)
This problem is chronic and stable.  She uses CPAP anytime she lays down but does not sleep more than 2 hours at a time.  She states that she and Dr. Eppie Gibson I discussed a repeat sleep study after her last appointment.  We did not conclude that she needed a repeat sleep study at this time.  It was to follow-up on her headaches and to ensure she was on the proper settings.  PLAN:  Cont CPAP with O2

## 2018-07-15 NOTE — Assessment & Plan Note (Signed)
This problem is chronic and stable.  Dr. Aundra Dubin is her cardiologist.  She uses Lasix 80 every day, at least most days.  She does not weigh herself daily although she got a new scale recently.  She judges her volume status by the appearance of her feet and if she forgets her Lasix and her feet start to swell, then she will resume her Lasix.  She is on oxygen by nasal cannula and uses it anytime she lays down and puts on her CPAP and anytime she is out of her house.  PLAN : Follow-up cardiology notes Lasix 80 daily Oxygen by nasal cannula

## 2018-07-15 NOTE — Assessment & Plan Note (Signed)
This problem is chronic and uncontrolled.  She is on gabapentin although notes indicated this is for her neuropathic pain, Wellbutrin, Cymbalta, hydrocodone, and Xanax.  As this is her first appointment, we could not delve deeply into every issue and I will continue to follow-up at future appointments.  PLAN:  Cont current meds

## 2018-07-15 NOTE — Assessment & Plan Note (Signed)
>>  ASSESSMENT AND PLAN FOR CHRONIC CHF (CONGESTIVE HEART FAILURE) (HCC) WRITTEN ON 07/15/2018  3:26 PM BY BUTCHER, ELIZABETH A, MD  This problem is chronic and stable.  Her weight is down 2 pounds since January today.  She is on Lasix 80 a day and takes it most days.  She did not appear to be volume overloaded today.  PLAN:  Cont current meds

## 2018-07-15 NOTE — Assessment & Plan Note (Signed)
This problem is chronic and uncontrolled.  She now rolls her own cigarettes and rolls about 8 in the morning and 8 in the afternoon so she is smoking less than a pack per day.  She is not ready to set a quit date.  Her smoking is keeping her family, including her new great granddaughter, away.  PLAN : Follow

## 2018-07-15 NOTE — Progress Notes (Signed)
Medication Samples have been provided to the patient.  Drug name: Spiriva       Strength: 1.25 mg        Qty: 1 LOT: [id number]  Exp.Date: 10/2018 Drug name: Nicotine       Strength: 14 mg       Qty: 1  LOT: [id number]  Exp.Date: 06/2019  The patient has been instructed regarding the correct time, dose, and frequency of taking this medication, including desired effects and most common side effects.   Brief counseling was also provided on smoking cessation. Patient reports contemplating quitting smoking, barrier included readiness. However, patient is now motivated due to health consequences related to smoking, she also has a new great granddaughter. She reports she is transitioning to the planning/readiness stage and is registered with [facility]. Offered assistance with cessation and patient requested a sample of nicotine patches (provided as listed above). Patient was advised to contact clinic if further assistance is needed. Patient verbalized understanding by repeat back.  [name] 11:06 AM 07/15/2018

## 2018-07-15 NOTE — Patient Instructions (Addendum)
Please contact clinic or Mannie Stabile if you need further assistance with smoking cessation.  1. Please see Dr Lynnae January in 3 months 2. I am checking blood work today

## 2018-07-16 ENCOUNTER — Encounter: Payer: Self-pay | Admitting: Internal Medicine

## 2018-07-16 ENCOUNTER — Telehealth: Payer: Self-pay | Admitting: Internal Medicine

## 2018-07-16 LAB — FERRITIN: Ferritin: 11 ng/mL — ABNORMAL LOW (ref 15–150)

## 2018-07-16 LAB — CBC
Hematocrit: 34.8 % (ref 34.0–46.6)
Hemoglobin: 11 g/dL — ABNORMAL LOW (ref 11.1–15.9)
MCH: 27.3 pg (ref 26.6–33.0)
MCHC: 31.6 g/dL (ref 31.5–35.7)
MCV: 86 fL (ref 79–97)
Platelets: 230 10*3/uL (ref 150–450)
RBC: 4.03 x10E6/uL (ref 3.77–5.28)
RDW: 15.8 % — ABNORMAL HIGH (ref 11.7–15.4)
WBC: 9 10*3/uL (ref 3.4–10.8)

## 2018-07-16 LAB — VITAMIN B12: Vitamin B-12: 398 pg/mL (ref 232–1245)

## 2018-07-16 MED ORDER — TIOTROPIUM BROMIDE MONOHYDRATE 2.5 MCG/ACT IN AERS: 2.0000 | INHALATION_SPRAY | Freq: Every day | RESPIRATORY_TRACT | 11 refills | Status: DC

## 2018-07-16 NOTE — Addendum Note (Signed)
Addended by: Forde Dandy on: 07/16/2018 09:40 AM   Modules accepted: Orders

## 2018-07-16 NOTE — Telephone Encounter (Signed)
Glad to hear it.

## 2018-07-16 NOTE — Progress Notes (Signed)
Patient requested inhaler device change from Spiriva handihaler to respimat. Sent new prescription to pharmacy and notified them of change.

## 2018-07-16 NOTE — Assessment & Plan Note (Signed)
I reviewed her chart in regards to her normocytic anemia with iron deficiency.  Her hemoglobin remained stable in the 11's. Her ferritin remains low at 11.  Her vitamin B12 is normal at 400.  Review of her chart indicates that she is gotten Feraheme, 1 dose, and each at the following months November 2016, May 2017, February 2018, and February 2019.  My guess is she has a chronic slow GI loss that is preventing normalization of her iron stores.  She denied GI blood loss.  She then denied having a GI bleed that we pulled up her capsule endoscopy results and I was able to show her the pictures of the AVMs.  She remains on oral ferrous gluconate once a day.  PLAN : Check B12 every year or so Check ferritin every year or so Continue ferrous gluconate

## 2018-07-16 NOTE — Telephone Encounter (Signed)
Pt calls and thanks triage for giving her encouragement to see dr Software engineer, she states she is very pleased and is excited to have a pcp so interested in providing exceptional care

## 2018-07-16 NOTE — Telephone Encounter (Signed)
Pt would like a nurse to callback [phone]

## 2018-07-20 ENCOUNTER — Other Ambulatory Visit: Payer: Self-pay | Admitting: Internal Medicine

## 2018-07-21 ENCOUNTER — Other Ambulatory Visit: Payer: Self-pay

## 2018-07-21 DIAGNOSIS — I5032 Chronic diastolic (congestive) heart failure: Secondary | ICD-10-CM

## 2018-07-22 MED ORDER — POTASSIUM CHLORIDE CRYS ER 20 MEQ PO TBCR: 20.0000 meq | EXTENDED_RELEASE_TABLET | Freq: Two times a day (BID) | ORAL | 3 refills | Status: DC

## 2018-07-22 NOTE — Telephone Encounter (Signed)
Opened in Error

## 2018-07-30 DIAGNOSIS — I050 Rheumatic mitral stenosis: Secondary | ICD-10-CM | POA: Diagnosis not present

## 2018-07-30 DIAGNOSIS — I509 Heart failure, unspecified: Secondary | ICD-10-CM | POA: Diagnosis not present

## 2018-07-30 DIAGNOSIS — J449 Chronic obstructive pulmonary disease, unspecified: Secondary | ICD-10-CM | POA: Diagnosis not present

## 2018-08-02 ENCOUNTER — Other Ambulatory Visit: Payer: Self-pay

## 2018-08-02 NOTE — Patient Outreach (Addendum)
[facility]) Care Management  08/02/2018  [name] May 08, 1950 [id number]    RN Health Coach closing the program.  Patient is transitioning to external program [facility] for continued case management.   Lazaro Arms RN, BSN, [place] Direct Dial:  [phone]  Fax: [phone]

## 2018-08-04 IMAGING — CT CT ABDOMEN WO/W CM
3 of 11 series · 11 of 46 positions shown, 17 images · IV contrast (isovue)
Comparison: CT of the abdomen performed 08/23/2015, and MRI of the
lumbar spine performed 11/15/2015

CLINICAL DATA: Acute onset of generalized abdominal pain. Acute
blood loss. Initial encounter.

EXAM:
CT ABDOMEN WITHOUT AND WITH CONTRAST
TECHNIQUE: Multidetector CT imaging of the abdomen was performed following the
standard protocol before and following the bolus administration of
intravenous contrast.
CONTRAST:  100 mL of Isovue 300 IV contrast

[Series 2: renal w/o 3.0 · axial · non-contrast · 0.82mm/px · z∈[+958,+1210]mm · 7 of 113 slices shown, 12 images]
[im 15/113  soft-tissue]
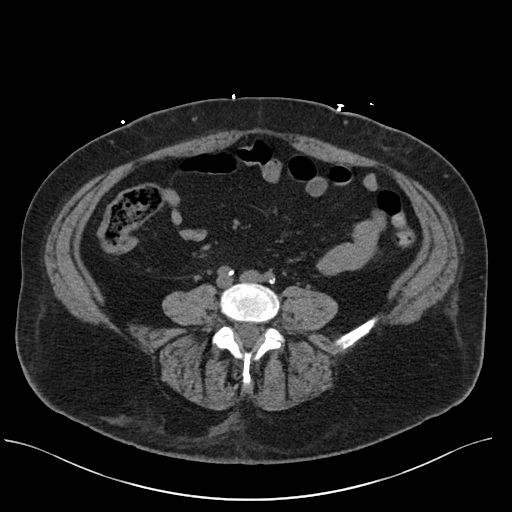
[im 15/113  bone]
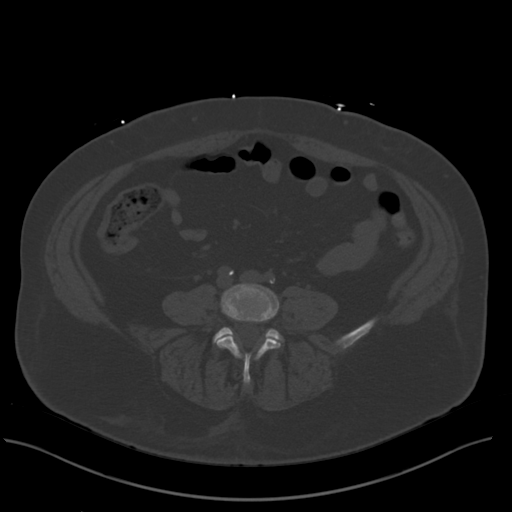
[im 29/113  soft-tissue]
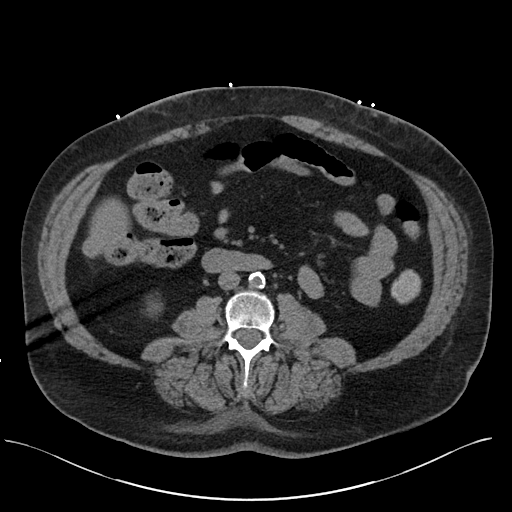
[im 43/113  soft-tissue]
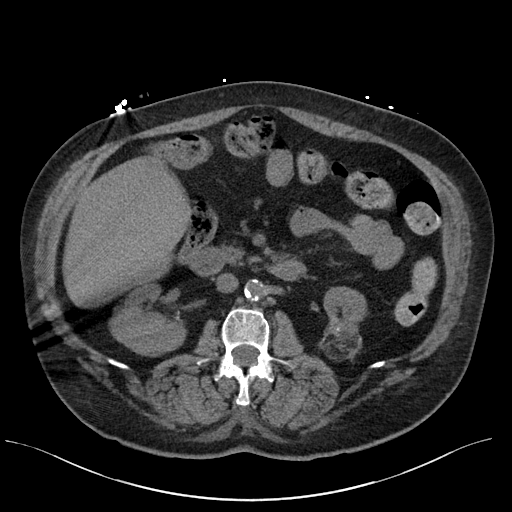
[im 57/113  soft-tissue]
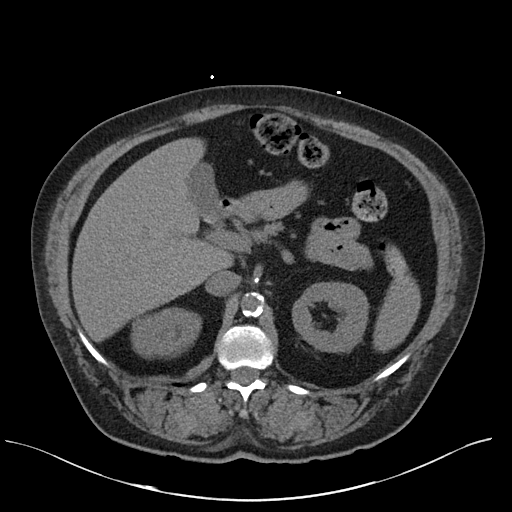
[im 57/113  lung]
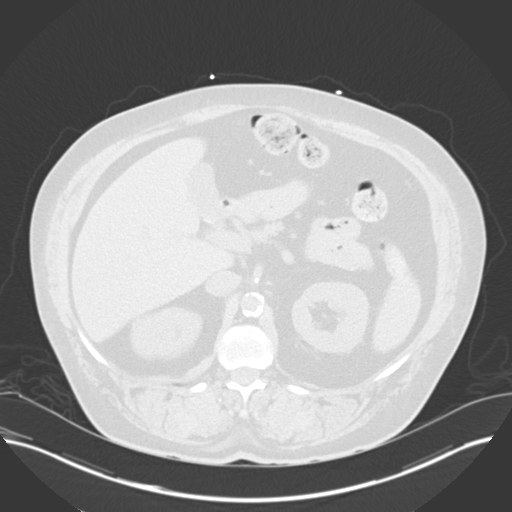
[im 71/113  soft-tissue]
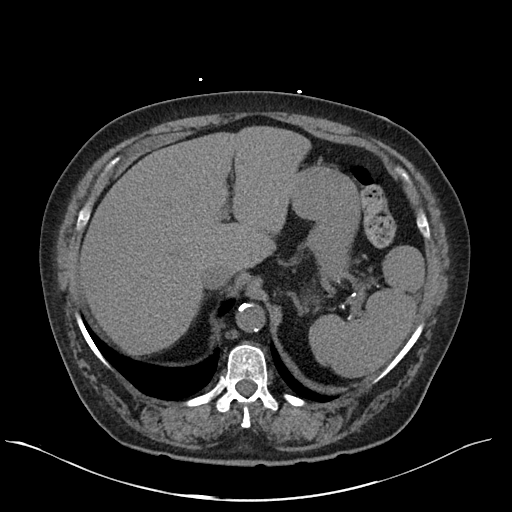
[im 71/113  lung]
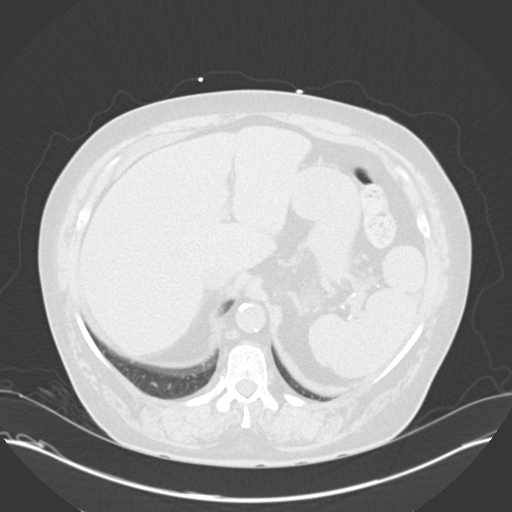
[im 85/113  soft-tissue]
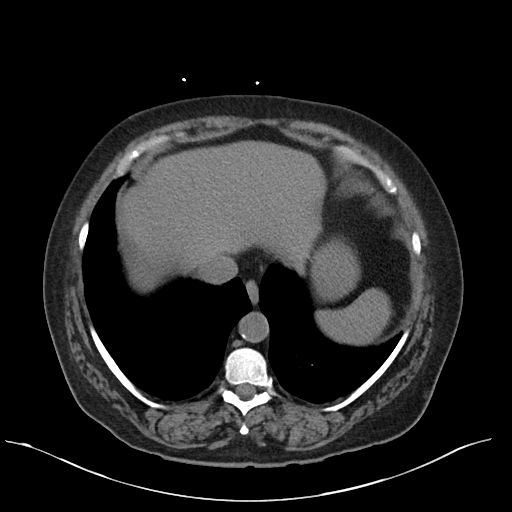
[im 85/113  lung]
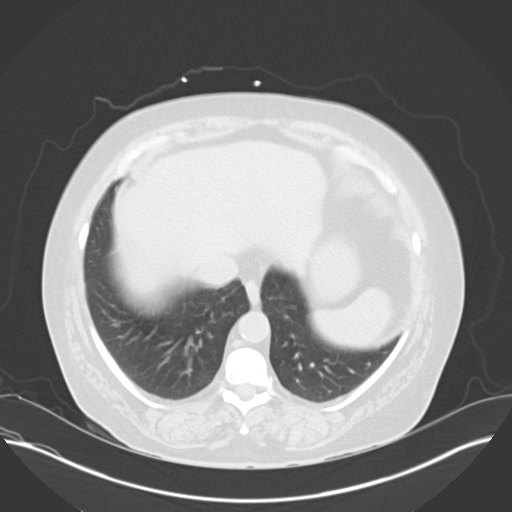
[im 99/113  soft-tissue]
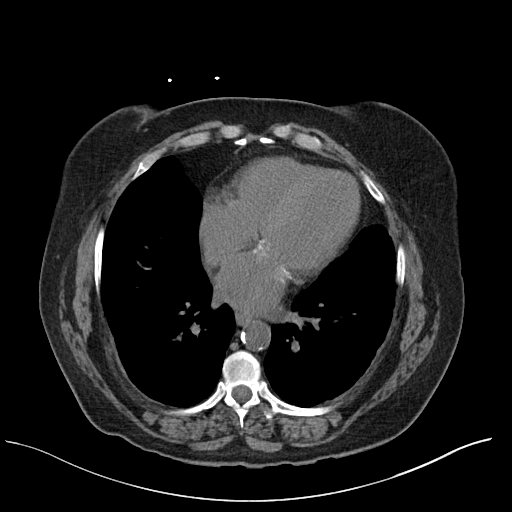
[im 99/113  lung]
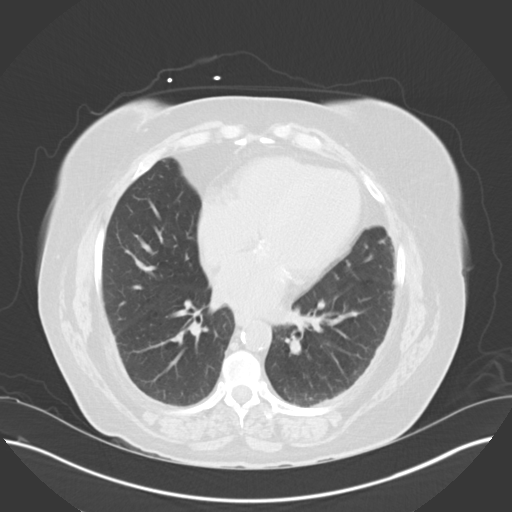

[Series 3: renal w/o 2.0 cor · coronal · non-contrast · 0.66mm/px · 2 of 150 slices shown, 3 images]
[im 50/150  soft-tissue]
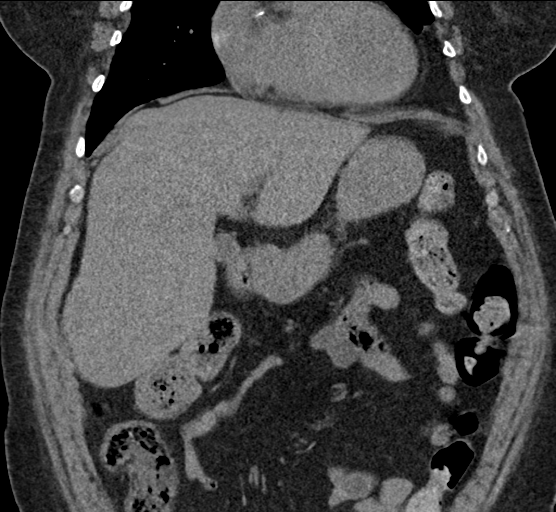
[im 50/150  bone]
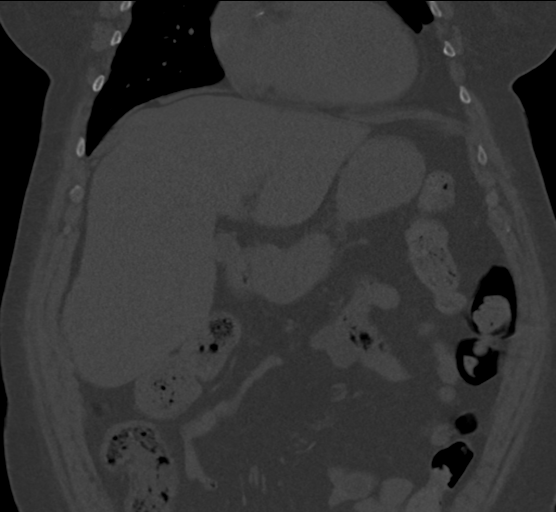
[im 100/150  soft-tissue]
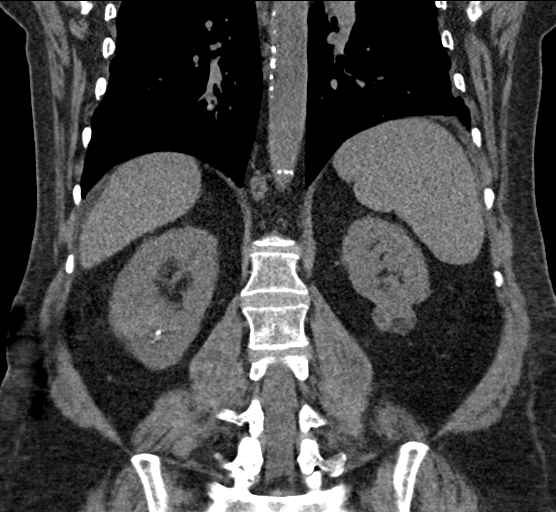

[Series 6: arterial 3.0 · axial · arterial · 0.82mm/px · z∈[+958,+1000]mm · 2 of 113 slices shown]
[im 15/113  soft-tissue]
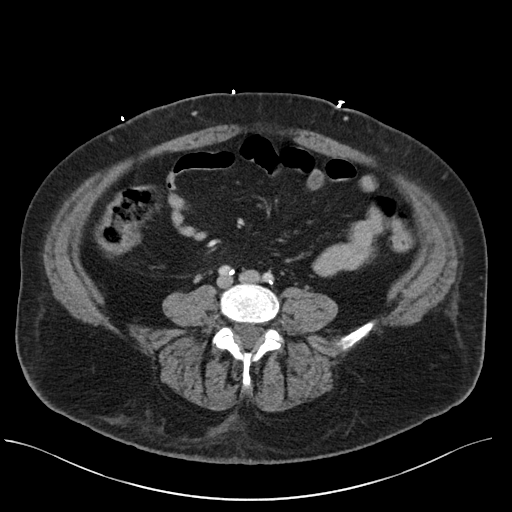
[im 29/113  soft-tissue]
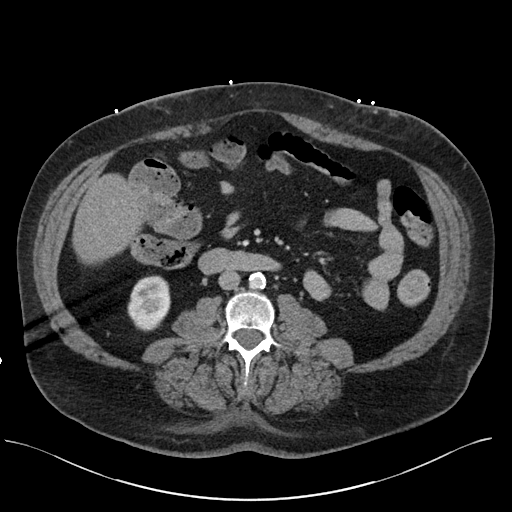

[11 of 46 positions shown; findings below may reference images not displayed]

FINDINGS: Lower chest: A bleb is noted at the right lung base. Scattered
calcification is noted at the aortic and mitral valves, with
postoperative change at the mitral valve. Scattered coronary artery
calcifications are seen.

Hepatobiliary: The liver is unremarkable in appearance. The
gallbladder is unremarkable in appearance. The common bile duct
remains normal in caliber. Trace fluid is seen about the liver.

Pancreas: The pancreas is within normal limits.

Spleen: The spleen is unremarkable in appearance.

Adrenals/Urinary Tract: The adrenal glands are unremarkable in
appearance.

Post ablation changes at the posterior aspect of the lower pole of
the left kidney are grossly unchanged from 2648. There is no
evidence of recurrent mass, with a mixture fatty attenuation and
soft tissue attenuation. Mild delayed enhancement at the small
cm soft tissue component along the posterior aspect of the ablation
area is stable in appearance.

A 6 mm nonobstructing stone is noted at the interpole region of the
right kidney. Mild nonspecific perinephric stranding is noted
bilaterally. There is no evidence of hydronephrosis. No obstructing
ureteral stones are identified. No perinephric stranding is seen.

Stomach/Bowel: Visualized small and large bowel loops are grossly
unremarkable in appearance. The stomach is largely decompressed and
unremarkable.

Vascular/Lymphatic: Diffuse calcification is seen along the
abdominal aorta and its branches. The abdominal aorta is otherwise
grossly unremarkable. The inferior vena cava is grossly
unremarkable. No retroperitoneal lymphadenopathy is seen.

Other: There is question of mild soft tissue inflammation at the
right lower quadrant. Would correlate clinically for any evidence of
appendicitis, as the appendix is not imaged on this study.

Musculoskeletal: No acute osseous abnormalities are identified. The
visualized musculature is unremarkable in appearance.
IMPRESSION: 1. Stable appearance to post ablation change at the lower pole of
the left kidney, with a small 1.2 cm focus of enhancing soft tissue
again noted along the posterior aspect of the region, thought to
reflect enhancing scar tissue. No evidence of local recurrence.
2. Question of mild soft tissue inflammation at the right lower
quadrant, incompletely imaged on this study. Would correlate
clinically for any evidence of appendicitis, as the appendix is not
imaged on this study.
3. Diffuse aortic atherosclerosis.
4. 6 mm nonobstructing stone at the interpole region of the right
kidney.
5. Bleb at the right lung base. Scattered calcification at the
aortic and mitral valves. Scattered coronary artery calcifications
seen.

## 2018-08-05 ENCOUNTER — Telehealth: Payer: Self-pay

## 2018-08-05 NOTE — Telephone Encounter (Signed)
Lm for pt, will try again tomorrow

## 2018-08-05 NOTE — Telephone Encounter (Signed)
Requesting to speak with [name]. Please call back.

## 2018-08-06 NOTE — Telephone Encounter (Signed)
Pt continues to be anxious about COVID, reassured

## 2018-08-09 ENCOUNTER — Telehealth: Payer: Self-pay | Admitting: Internal Medicine

## 2018-08-09 NOTE — Telephone Encounter (Signed)
   Reason for call:   I received a call from Ms. [name] at 8:35 AM indicating she thinks she may have taken too much of her pain medicine.   Pertinent Data:   Ms. Cohoon states that her fibromyalgia pain was worse than usual this morning. After taking her usual 3 tabs of gabapentin this morning and not having any relief she took her prescribed hydrocodone, however instead of 2 pills she thinks she might have taken 3; this was about 15 minutes ago.  She states that usually when she takes her pain medicine she doesn't have symptoms of dizziness, weakness, sleepiness and she currently denies any symptoms other than her generalized pain.  She has not and does not plan to take any of her Xanax today.   Assessment / Plan / Recommendations:   Patient possibly took 1 extra hydrocodone 5-368m this morning; currently denying symptoms of oversedation. She is able to hold appropriate conversation over the phone with me this morning.  I advised her to not drive, operate heavy machinery or similar this morning; advised her to avoid other sedatives this morning.  Will have our triage nurse call her in a couple of hours to check in on her; patient was in agreement with this.   Advised if she starts having excessive sedation prior to that call to reach back out to [place] to EMS.    SAlphonzo Grieve MD   08/09/2018, 8:44 AM

## 2018-08-09 NOTE — Telephone Encounter (Signed)
TC to patient per Dr. Winfred Burn request; Pt states she has been reading, is feeling better and has made her something to eat.  She denies any excessive sedation.  Pt states "I don't know for sure if I took an extra pain pill, but something is telling me I did".  Pt very pleasant and appropriate during conversation with RN.  She verbalized understanding not to take any additional pain meds/sedatives this morning and will call 911 if she experiences any excessive drowsiness/sedation. SChaplin, RN,BSN

## 2018-08-09 NOTE — Telephone Encounter (Signed)
Thank you!  [name]

## 2018-08-17 ENCOUNTER — Other Ambulatory Visit: Payer: Self-pay | Admitting: Internal Medicine

## 2018-08-17 ENCOUNTER — Ambulatory Visit (HOSPITAL_COMMUNITY)
Admission: RE | Admit: 2018-08-17 | Discharge: 2018-08-17 | Disposition: A | Discharge status: Home / Self Care | Payer: PPO | Source: Ambulatory Visit | Attending: Family Medicine | Admitting: Family Medicine

## 2018-08-17 ENCOUNTER — Ambulatory Visit (INDEPENDENT_AMBULATORY_CARE_PROVIDER_SITE_OTHER): Payer: PPO | Admitting: Internal Medicine

## 2018-08-17 ENCOUNTER — Other Ambulatory Visit: Payer: Self-pay

## 2018-08-17 VITALS — BP 142/46 | HR 74 | Temp 98.2°F | Wt 185.1 lb

## 2018-08-17 DIAGNOSIS — K5909 Other constipation: Secondary | ICD-10-CM | POA: Diagnosis not present

## 2018-08-17 DIAGNOSIS — Z79899 Other long term (current) drug therapy: Secondary | ICD-10-CM

## 2018-08-17 DIAGNOSIS — K648 Other hemorrhoids: Secondary | ICD-10-CM

## 2018-08-17 DIAGNOSIS — K644 Residual hemorrhoidal skin tags: Secondary | ICD-10-CM

## 2018-08-17 DIAGNOSIS — R5383 Other fatigue: Secondary | ICD-10-CM

## 2018-08-17 DIAGNOSIS — I440 Atrioventricular block, first degree: Secondary | ICD-10-CM | POA: Insufficient documentation

## 2018-08-17 DIAGNOSIS — R0789 Other chest pain: Secondary | ICD-10-CM | POA: Insufficient documentation

## 2018-08-17 DIAGNOSIS — K649 Unspecified hemorrhoids: Secondary | ICD-10-CM

## 2018-08-17 DIAGNOSIS — F331 Major depressive disorder, recurrent, moderate: Secondary | ICD-10-CM | POA: Diagnosis not present

## 2018-08-17 DIAGNOSIS — K625 Hemorrhage of anus and rectum: Secondary | ICD-10-CM | POA: Diagnosis not present

## 2018-08-17 DIAGNOSIS — Z86010 Personal history of colonic polyps: Secondary | ICD-10-CM | POA: Diagnosis not present

## 2018-08-17 DIAGNOSIS — F321 Major depressive disorder, single episode, moderate: Secondary | ICD-10-CM

## 2018-08-17 DIAGNOSIS — R0602 Shortness of breath: Secondary | ICD-10-CM

## 2018-08-17 DIAGNOSIS — I4510 Unspecified right bundle-branch block: Secondary | ICD-10-CM | POA: Insufficient documentation

## 2018-08-17 LAB — CBC
HCT: 35.2 % — ABNORMAL LOW (ref 36.0–46.0)
Hemoglobin: 10.7 g/dL — ABNORMAL LOW (ref 12.0–15.0)
MCH: 26.7 pg (ref 26.0–34.0)
MCHC: 30.4 g/dL (ref 30.0–36.0)
MCV: 87.8 fL (ref 80.0–100.0)
Platelets: 210 10*3/uL (ref 150–400)
RBC: 4.01 MIL/uL (ref 3.87–5.11)
RDW: 15.7 % — ABNORMAL HIGH (ref 11.5–15.5)
WBC: 8.5 10*3/uL (ref 4.0–10.5)
nRBC: 0 % (ref 0.0–0.2)

## 2018-08-17 LAB — POC HEMOCCULT BLD/STL (OFFICE/1-CARD/DIAGNOSTIC): Fecal Occult Blood, POC: NEGATIVE

## 2018-08-17 LAB — GLUCOSE, CAPILLARY: Glucose-Capillary: 160 mg/dL — ABNORMAL HIGH (ref 70–99)

## 2018-08-17 MED ORDER — HYDROCORTISONE 1 % EX CREA: TOPICAL_CREAM | CUTANEOUS | 0 refills | Status: DC

## 2018-08-17 NOTE — Patient Instructions (Addendum)
Thank you for allowing [place] to provide your care today. Today we discussed your hemorrhoids.   Today, we performed a complete blood count, which showed a stable hemoglobin.   Please continue to take your iron supplement daily.   Today we made the following changes to your medications:   Please START taking  Senokot or Miralax over the counter for your chronic constipation and take as directed.   For your hemorrhoids, please place a small amount of hydrocortisone cream 1% two times per day.   Please follow-up if you have recurrence of significant rectal bleeding, if you have increased weakness, chest pain or other worsening symptoms.  Should you have any questions or concerns please call the internal medicine clinic at [phone].

## 2018-08-17 NOTE — Progress Notes (Signed)
CC: bright red blood per rectum  HPI:  [name] is a 68 y.o. with PMH as below.   Please see A&P for assessment of the patient's acute and chronic medical conditions.   Past Medical History:  Diagnosis Date  . Adenomatous polyps 05/14/2011   Colonoscopy (05/2011): 4 mm adenomatous polyp excised endoscopically Colonoscopy (02/2002): Adenomatous polyp excised endoscopically   . Allergic rhinitis 06/01/2012  . Anemia of chronic disease 01/01/2013  . Anxiety 07/24/2010  . Aortic atherosclerosis ([place]) 10/19/2014   Seen on CT scan, currently asymptomatic  . 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 Arthritis    "lower back; hands" (02/19/2018)  . Asymptomatic cholelithiasis 09/25/2015   Seen on CT scan 08/2015  . Carotid artery stenosis    s/p right endarterectomy (06/2010) Carotid US (07/2010):  Left: Moderate-to-severe (60-79%) calcific and non-calcific plaque origin and proximal ICA and ECA   . Chronic congestive heart failure with left ventricular diastolic dysfunction ([place]) 10/21/2010  . Chronic constipation 02/03/2011  . Chronic daily headache 01/16/2014  . Chronic headache    "weekly now" (11/13/2017)  . Chronic low back pain 10/06/2012  . Chronic venous insufficiency 08/04/2012  . Clear cell renal cell carcinoma ([place]) 07/21/2011   s/p cryoablation of left RCC in 09/2011 by [name]. [facility] [name]  [facility] .    Marland Kitchen COPD (chronic obstructive pulmonary disease) with emphysema (HCC)    PFTs 02/2012: FEV1 0.92 (40%), ratio 69, 27% increase in FEV1 with BD, TLC 91%, severe airtrapping, DLCO49% On chronic home O2. Pulmonary rehab referral 05/2012   . Daily headache    "recently" (02/19/2018)  . Depression 11/19/2005  . Esophageal stenosis 08/08/2015   Mild, benign-appearing on EGD 06/29/2015  . Fibromyalgia 08/29/2010  . Gastroesophageal reflux disease   . History of blood  transfusion    "several times"  (02/19/2018)  . History of hiatal hernia   . History of kidney stones   . Hyperlipidemia LDL goal < 100 11/20/2005  . Hypertension   . Internal hemorrhoids 08/04/2012  . Lichen sclerosus of female genitalia 01/12/2017  . Migraine    "none in years" (02/19/2018)  . Mitral stenosis    s/p Mitral valve replacement with a 27-mm pericardial porcine valve (Medtronic Mosaic valve, serial #[id number] on 09/20/10, Dr. Prescott Gum)   . Moderate to severe pulmonary hypertension ([place])    2014 TEE w PA peak pressure 46 mmHg, s/p MV replacement   . Moderately severe major depression ([place]) 11/19/2005  . Obesity (BMI 30.0-34.9) 10/23/2011  . Obstructive sleep apnea    Nocturnal polysomnography (06/2009): Moderate sleep apnea/ hypopnea syndrome , AHI 17.8 per hour with nonpositional hypopneas. CPAP titration to 12 CWP, AHI 2.4 per hour. On nocturnal CPAP via a small resMed Quattro full-face mask with heated humidifier.   . On home oxygen therapy    "2L O2 w/CPAP when I sleep" (02/19/2018)  . OSA on CPAP    "2L O2 w/CPAP when I sleep" (02/19/2018)  . Osteoporosis    DEXA (12/09/2011): L-spine T -3.7, left hip T -1.4 DEXA (12/2004): L-spine T -2.6, left hip -0.1   . Paroxysmal atrial fibrillation ([place]) 10/22/2010   s/p Left atrial maze procedure for paroxysmal atrial fibrillation on 09/20/2010 by Dr Prescott Gum.  Subsequent splenic infarct, decision was made to re-anticoagulate with coumadin, likely life-long as this is the most likely cause of the splenic infarct.   Marland Kitchen  Pneumonia    "once"  (02/19/2018)  . Pulmonary hypertension due to chronic obstructive pulmonary disease ([place]) 04/25/2016  . Right nephrolithiasis 09/06/2014   5 mm non-obstructing calculus seen on CT scan 09/05/2014   . Seborrheic keratosis 09/28/2015  . Severe obesity (BMI 35.0-39.9) with comorbidity ([place]) 10/23/2011  . Shortness of breath dyspnea   . Tobacco abuse 07/28/2012  . Type 2 diabetes mellitus with diabetic  neuropathy ([place])    Review of Systems:   Review of Systems  Constitutional: Positive for malaise/fatigue and weight loss. Negative for diaphoresis and fever.  Respiratory: Positive for shortness of breath. Negative for cough.   Cardiovascular: Negative for chest pain, palpitations and leg swelling.  Gastrointestinal: Positive for blood in stool and constipation. Negative for abdominal pain, diarrhea, melena and nausea.  Neurological: Negative for dizziness, tingling and headaches.   Physical Exam:  Constitution: NAD, appears stated age  HENT: AT/[place], no icterus Cardio: RRR, no m/r/g, no LE edema, no JVD Respiratory: decreased breath sounds, on 2L , non-labored breathing, no w/r/r  Abdominal: diffuse mild TTP, non-distended, no palpable organomegaly, normal BS GU: external hemorrhoids, nonbleeding, no blood on exam, DRE without palpable masses Neuro: alert & oriented, pleasant  Skin: c/d/i    Vitals:   08/17/18 1356  BP: (!) 142/46  Pulse: 74  Temp: 98.2 F (36.8 C)  TempSrc: Oral  SpO2: 100%  Weight: 185 lb 1.6 oz (84 kg)     Assessment & Plan:   See Encounters Tab for problem based charting.  Patient discussed with Dr. Evette Doffing

## 2018-08-18 ENCOUNTER — Other Ambulatory Visit: Payer: Self-pay | Admitting: *Deleted

## 2018-08-18 DIAGNOSIS — E1140 Type 2 diabetes mellitus with diabetic neuropathy, unspecified: Secondary | ICD-10-CM

## 2018-08-18 DIAGNOSIS — Z794 Long term (current) use of insulin: Secondary | ICD-10-CM

## 2018-08-18 DIAGNOSIS — G8929 Other chronic pain: Secondary | ICD-10-CM

## 2018-08-18 DIAGNOSIS — M545 Low back pain, unspecified: Secondary | ICD-10-CM

## 2018-08-18 MED ORDER — GABAPENTIN 300 MG PO CAPS: 600.0000 mg | ORAL_CAPSULE | Freq: Three times a day (TID) | ORAL | 3 refills | Status: DC

## 2018-08-18 NOTE — Assessment & Plan Note (Addendum)
She states yesterday she had bright red blood with her bowel movement, there was slight pain initially but this resolved quickly. This occurs occasionally but yesterday seemed to be much more than in the past. Normally she has a small amount about once per month. She has a history of hemorrhoids with banding many years ago and thinks she still has hemorrhoids. She has chronic constipation and although she states her stools are not hard and does not have to strain, she goes to the bathroom about every 3-5 days. BM are brown. She denies abdominal pain, nausea, fever and dizziness. She has been feeling more fatigued and SOB lately. She has a history of 15lb weight loss over the past year that is attributed to starting Victoza. She has gained three lbs since last office visit on 6/4.  She has a history of anemia thought to possibly be secondary to chronic slow GI losses causing iron deficiency anemia in addition to anemia of chronic disease. Her ferritin is chronically low despite daily po iron and yearly iron transfusion. Her baseline hemoglobin is around 10-11. Last colonoscopy was in 2013, and had polyp without concern for malignancy on histology. She has a history of admission at this time as well for acute anemia, found to have non-bleeding AVM on EGD. Last EGD was in 2018 and showed no acute finding. History of renal cell carcinoma treated with ablation and no family history of colon cancer. She has deferred repeat screening colonoscopy after discussion with her previous PCP as she has severe COPD and is on 2L [place] chronically. She also has not been back to see her GI doctor as she owes them money.  On DRE she does have external hemorrhoids present. These were non-bleeding currently. No masses, thromboesed, or protruding internal hemorrhoids. I believe her symptoms of BRB are likely secondary to either external or internal hemorrhoids, exacerbated by her chronic constipation.   - stat CBC - normal MCV and  hemoglobin is at baseline  - given information on sitz baths - she states she still have the small bath at home that can be used over the toilet that she will be able to use.  - hydrocortisone cream 1% bid prn  - given strict return precautions if she has repeat episodes of bleeding or acute symptoms of fatigue and weakness. - if this does not improve consider referral to GI or general surgery for banding.

## 2018-08-18 NOTE — Assessment & Plan Note (Addendum)
She has been having intermittent chest pain while in the evening while sitting, and she describes it as like a "twinge". It can be right or left sided and lasts for about one minute. It does not radiate to the back, arms, or neck. The pain is not worsened with exertion and is relieved on its own. This has been occurring for about a week now. There is no associated shortness of breath, palpitations, or nausea. RRR and no murmurs on exam. No signs of acute heart failure. She has been taking her home medications. I do not think this represents ACS but will obtain an EKG.   - EKG done is unchanged from previous with no signs of ischemia. She does have a history of MI, bioprosthetic aortic valve and multiple other comorbidities. Discussed strict return precautions and to go to the ED if she has ongoing chest pain.  - she is planning to f/u with her cardiologist as well

## 2018-08-18 NOTE — Telephone Encounter (Signed)
Sep PCP appt

## 2018-08-18 NOTE — Assessment & Plan Note (Addendum)
History of chronic depression, treated with cymbalta 60 mg bid, bupropion 75 mg bid. Her PHQ-9 today is elevated. She states she has a lot going on with her multiple chronic illnesses which makes her depressed. She has no SI but ongoing little interest in doing things. She is does not currently want a change in medication therapy but is interested in speaking with Miquel Dunn to discuss how she is feeling. I believe her PCP ordered this previously but will resubmit referral.   - refer to behavioral health  - continue cymbalta and bupropion

## 2018-08-18 NOTE — Telephone Encounter (Signed)
Just filled June. This represents an excalation of her usage. What is now different?

## 2018-08-19 MED ORDER — HYDROCODONE-ACETAMINOPHEN 5-325 MG PO TABS: 1.0000 | ORAL_TABLET | Freq: Four times a day (QID) | ORAL | 0 refills | Status: DC | PRN

## 2018-08-19 NOTE — Progress Notes (Signed)
Internal Medicine Clinic Attending  Case discussed with Dr. Seawell at the time of the visit.  We reviewed the resident's history and exam and pertinent patient test results.  I agree with the assessment, diagnosis, and plan of care documented in the resident's note.    

## 2018-08-25 IMAGING — DX DG CHEST 2V
2 series · 2 of 2 positions shown · non-contrast
Comparison: 06/27/2015

CLINICAL DATA: Mid chest pain x2 hours for chest swelling.

EXAM:
CHEST  2 VIEW

[chest lat]
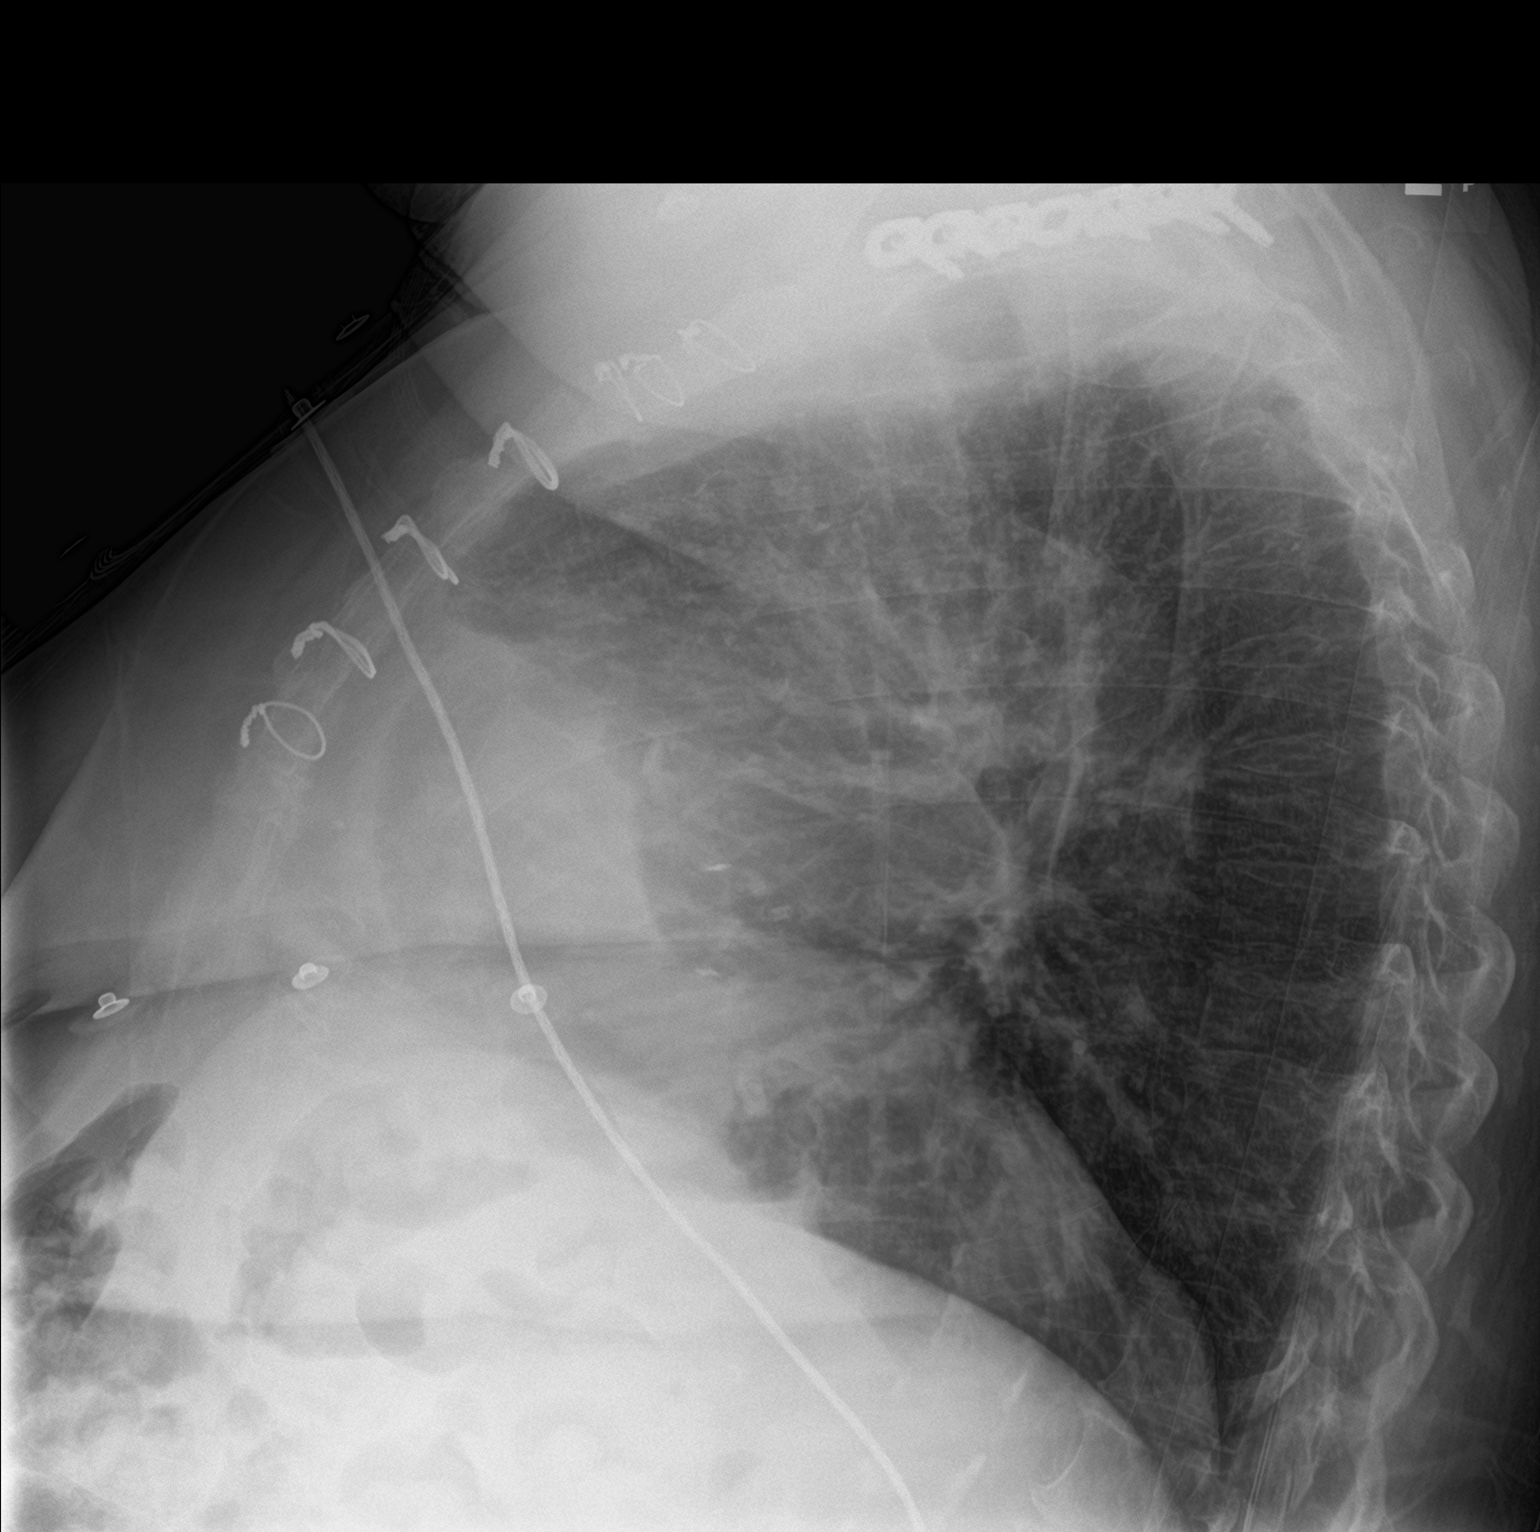

[chest ap]
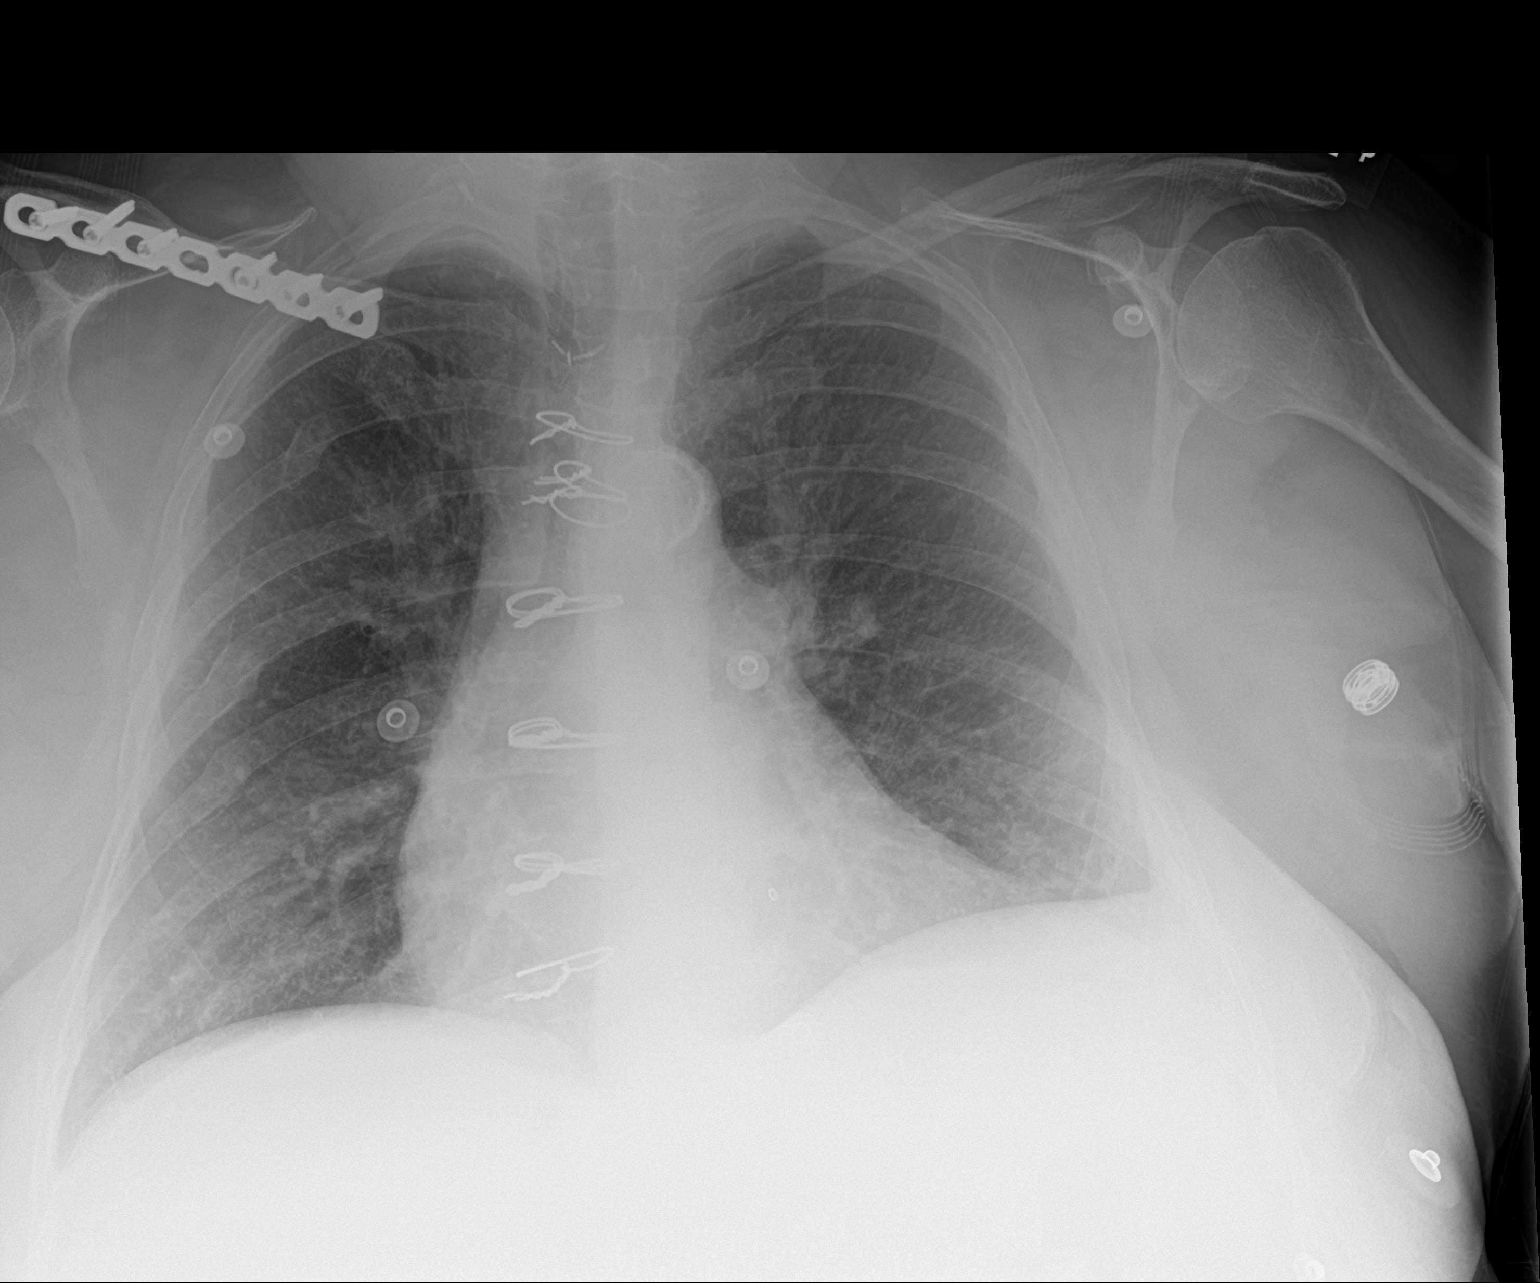

[2 of 2 positions shown; findings below may reference images not displayed]

FINDINGS: Stable cardiomegaly. The aorta is atherosclerotic without aneurysm.
Mild interstitial edema without pneumonic consolidation, effusion or
pneumothorax. The patient is status post median sternotomy. Old
right-sided rib fractures are noted with healing. Plate and screw
fixation of the mid clavicle. Vascular clips are seen at along the
right side of the neck.
IMPRESSION: Mild interstitial edema.

Aortic atherosclerosis.

Multiple chronic right-sided rib fractures. Plate and screw fixation
of the mid right clavicle.

## 2018-08-25 IMAGING — CT CT HEAD W/O CM
4 series · 17 of 47 positions shown, 19 images · non-contrast
Comparison: 01/07/2013

CLINICAL DATA: Headache and left upper extremity pain for 2 weeks.

EXAM:
CT HEAD WITHOUT CONTRAST
TECHNIQUE: Contiguous axial images were obtained from the base of the skull
through the vertex without intravenous contrast.

[Series 3: head without · axial · non-contrast · 0.40mm/px · z∈[-192,-72]mm · 7 of 32 slices shown, 9 images]
[im 4/32  brain]
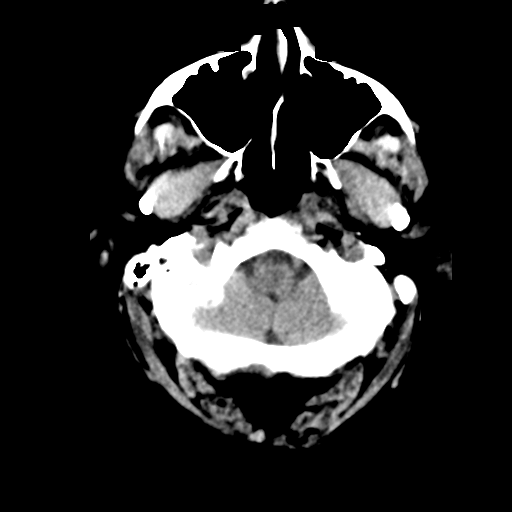
[im 4/32  bone]
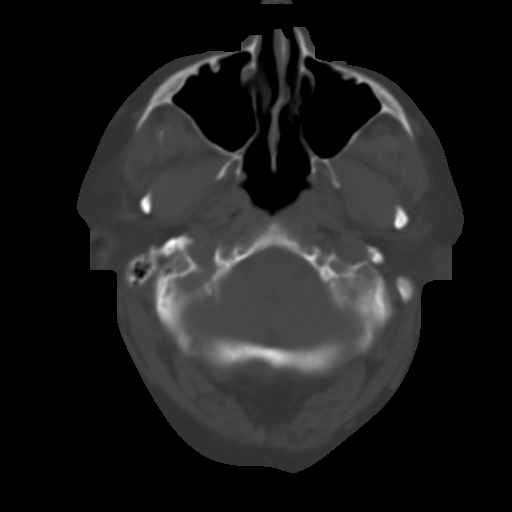
[im 8/32  brain]
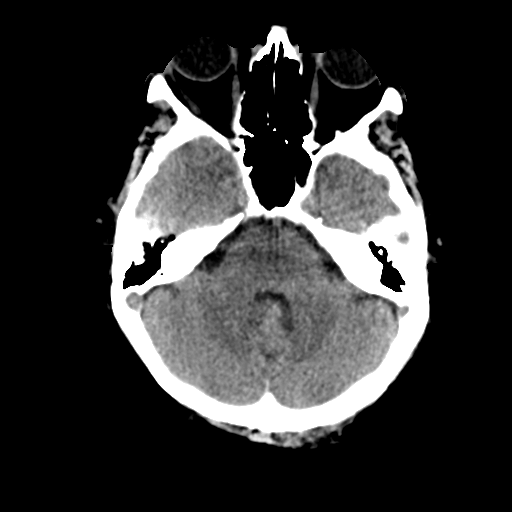
[im 12/32  brain]
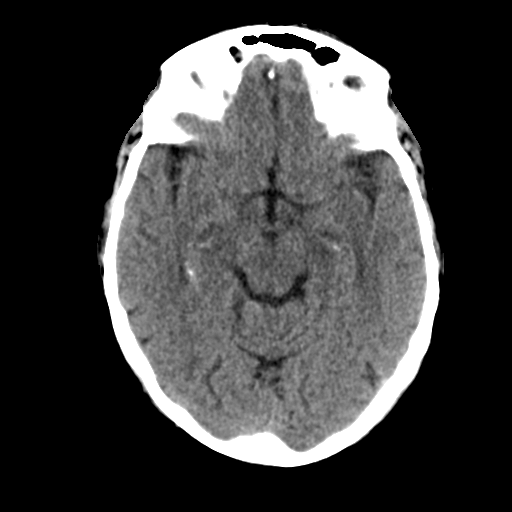
[im 16/32  brain]
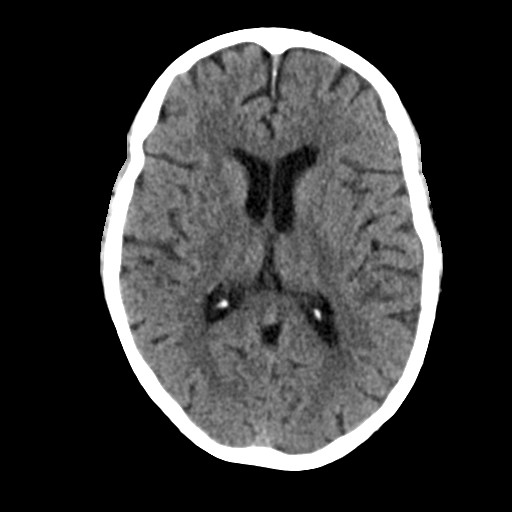
[im 20/32  brain]
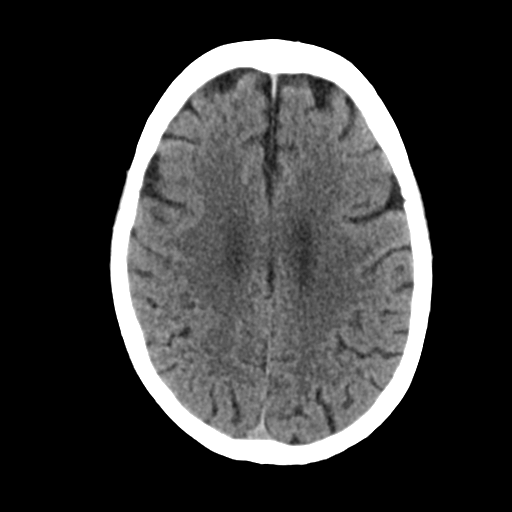
[im 20/32  bone]
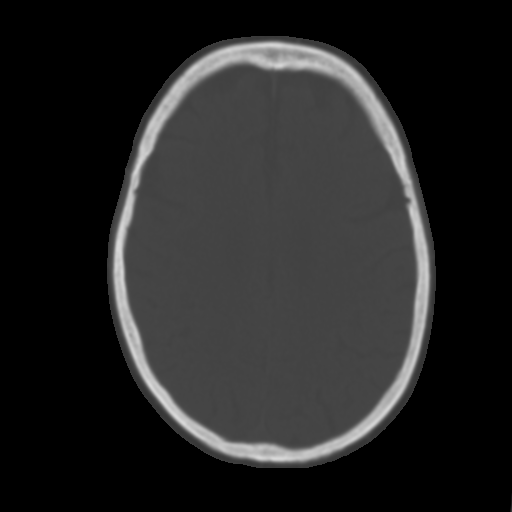
[im 24/32  brain]
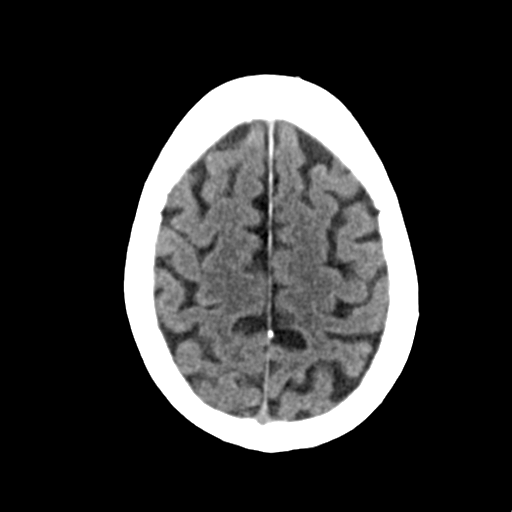
[im 28/32  brain]
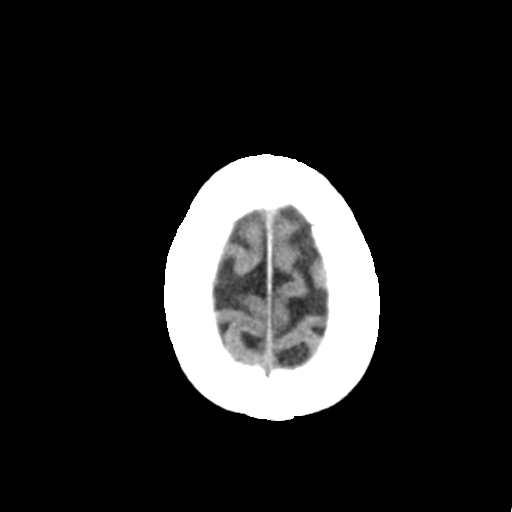

[Series 4: head bone · axial · 0.40mm/px · z∈[-193,-139]mm · 4 of 78 slices shown]
[im 8/78  bone]
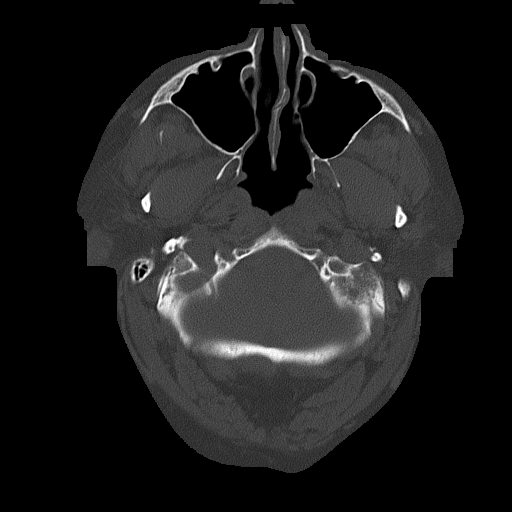
[im 16/78  bone]
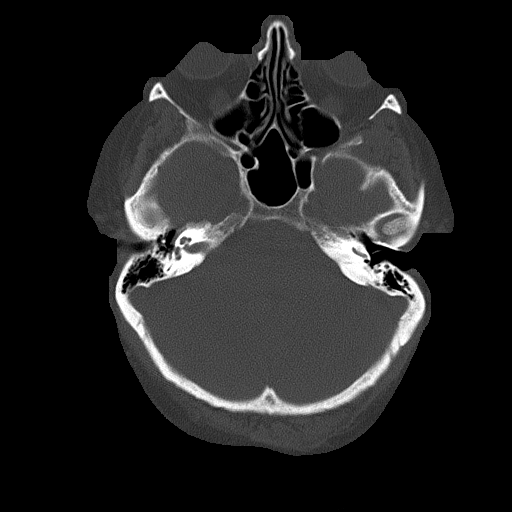
[im 24/78  bone]
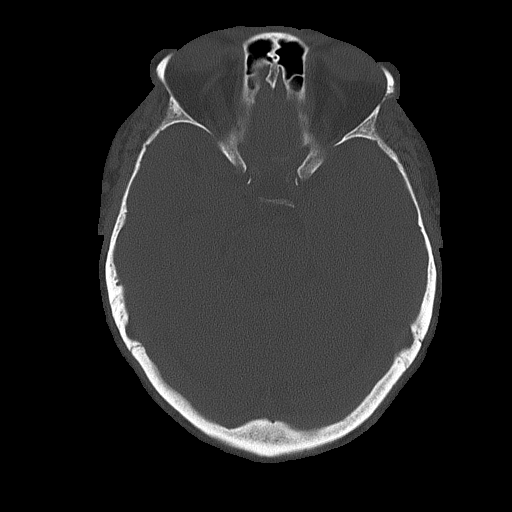
[im 35/78  bone]
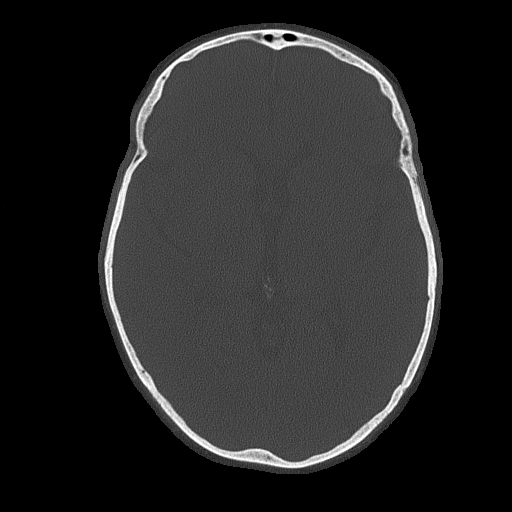

[Series 5: head without cor · coronal · non-contrast · 0.30mm/px · 3 of 63 slices shown]
[im 21/63  brain]
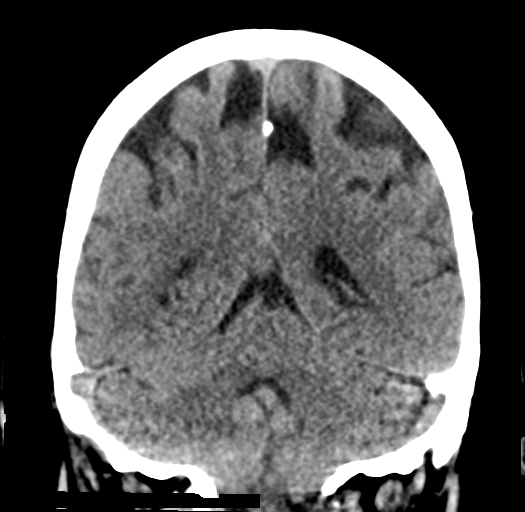
[im 28/63  brain]
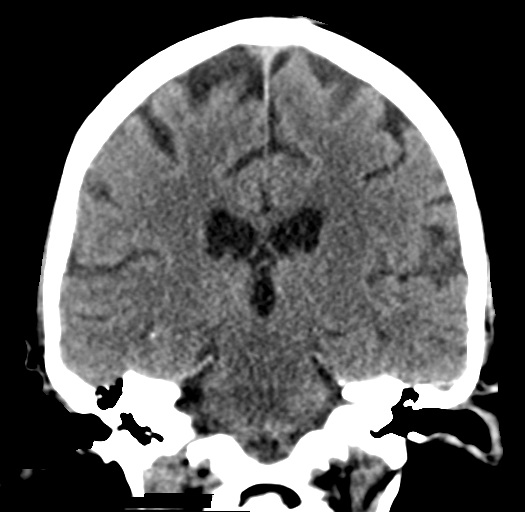
[im 35/63  brain]
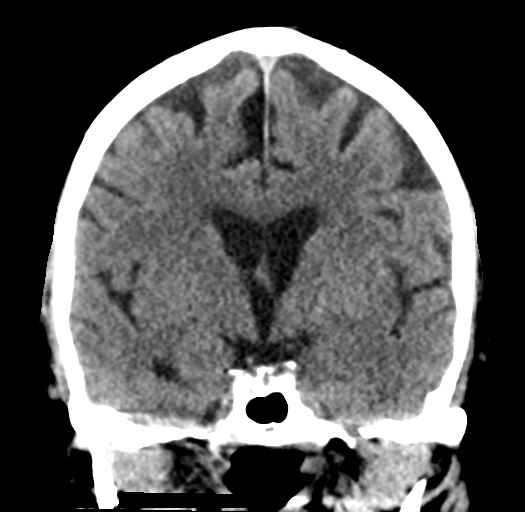

[Series 6: head without sag · sagittal · non-contrast · 0.30mm/px · 3 of 49 slices shown]
[im 17/49  brain]
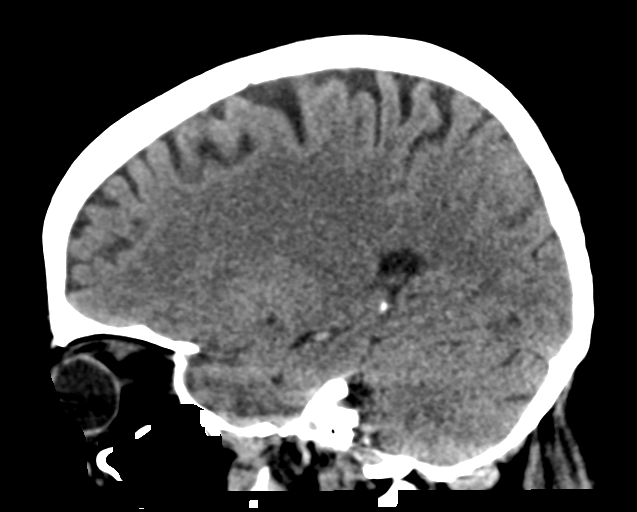
[im 25/49  brain]
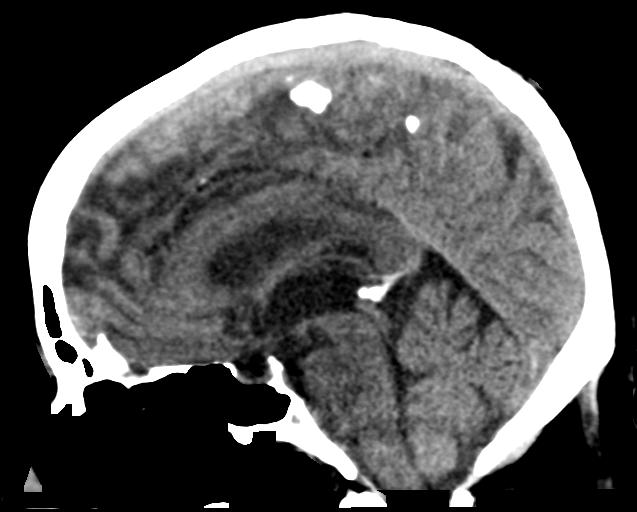
[im 33/49  brain]
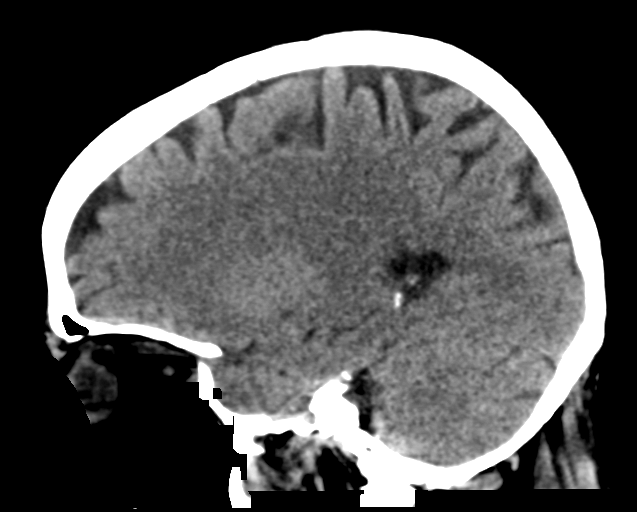

[17 of 47 positions shown; findings below may reference images not displayed]

FINDINGS: Brain: There is no intracranial hemorrhage, mass or evidence of
acute infarction. There is no extra-axial fluid collection. Gray
matter and white matter appear normal. Cerebral volume is normal for
age. Brainstem and posterior fossa are unremarkable. The CSF spaces
appear normal.

Vascular: No hyperdense vessel or unexpected calcification.

Skull: Normal. Negative for fracture or focal lesion.

Sinuses/Orbits: No acute finding.

Other: None.
IMPRESSION: Normal brain

## 2018-08-26 ENCOUNTER — Telehealth: Payer: Self-pay | Admitting: Licensed Clinical Social Worker

## 2018-08-26 ENCOUNTER — Encounter: Payer: Self-pay | Admitting: Licensed Clinical Social Worker

## 2018-08-26 NOTE — Telephone Encounter (Signed)
Patient was contacted (1st attempt) due to a referral we received from her doctor. Pt did not answer, and a voicemail was left for the patient. A letter will also be mailed.

## 2018-08-29 DIAGNOSIS — J449 Chronic obstructive pulmonary disease, unspecified: Secondary | ICD-10-CM | POA: Diagnosis not present

## 2018-08-29 DIAGNOSIS — I509 Heart failure, unspecified: Secondary | ICD-10-CM | POA: Diagnosis not present

## 2018-08-29 DIAGNOSIS — I050 Rheumatic mitral stenosis: Secondary | ICD-10-CM | POA: Diagnosis not present

## 2018-08-29 IMAGING — CR DG CHEST 2V
2 series · 2 of 2 positions shown · non-contrast
Comparison: 04/18/2016

CLINICAL DATA: Chest pain and dry cough

EXAM:
CHEST  2 VIEW

[chest pa]
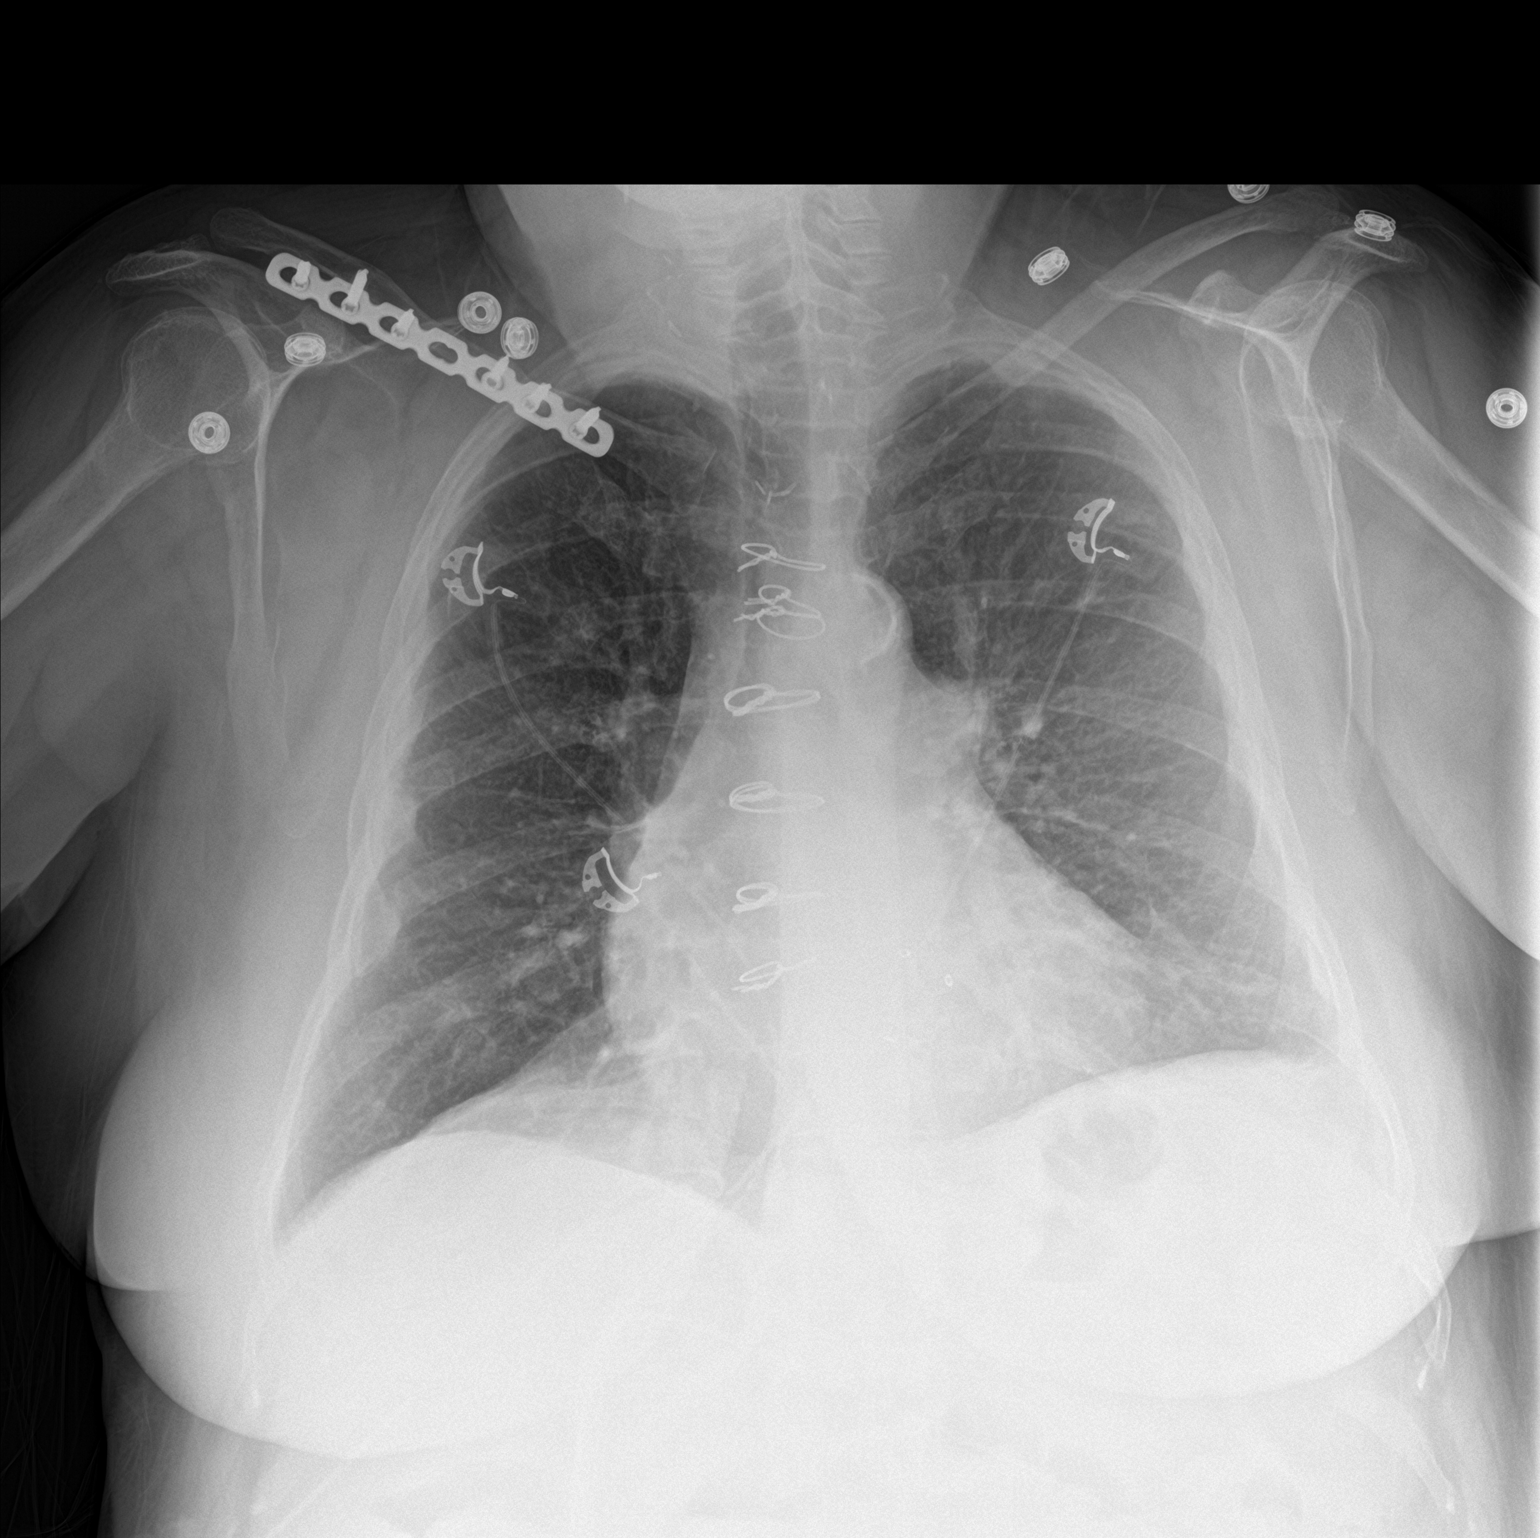

[chest lat]
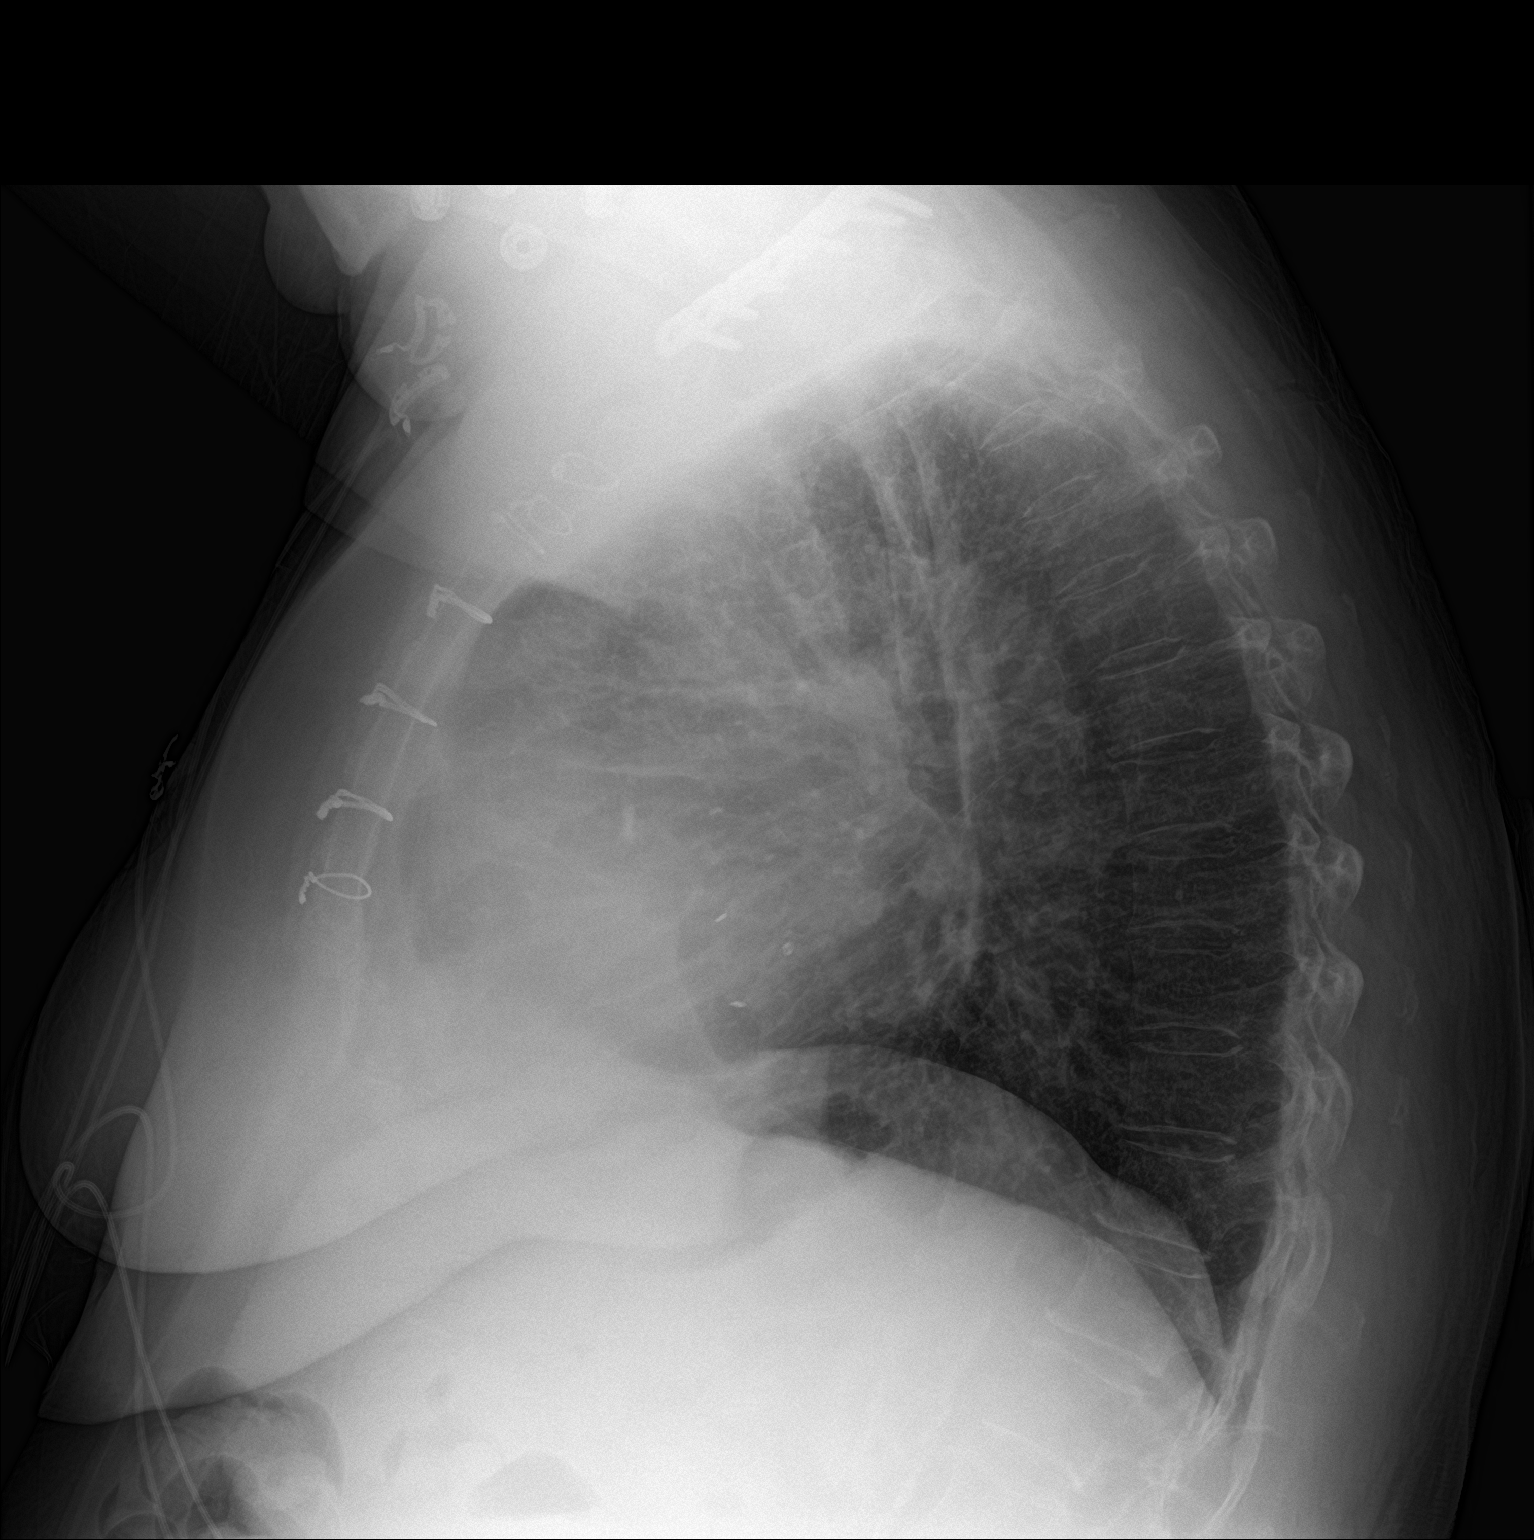

[2 of 2 positions shown; findings below may reference images not displayed]

FINDINGS: Cardiac shadow is within normal limits. Postsurgical changes are
again seen. Aortic calcifications are again noted. Multiple right
rib fractures are again seen with healing. Chronic blunting of left
costophrenic angle is noted. No focal infiltrate or sizable effusion
is seen.
IMPRESSION: No acute abnormality noted.

## 2018-09-01 ENCOUNTER — Ambulatory Visit (INDEPENDENT_AMBULATORY_CARE_PROVIDER_SITE_OTHER): Payer: PPO | Admitting: Licensed Clinical Social Worker

## 2018-09-01 ENCOUNTER — Encounter: Payer: Self-pay | Admitting: Licensed Clinical Social Worker

## 2018-09-01 ENCOUNTER — Other Ambulatory Visit: Payer: Self-pay

## 2018-09-01 DIAGNOSIS — F321 Major depressive disorder, single episode, moderate: Secondary | ICD-10-CM
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
## 2018-09-08 ENCOUNTER — Other Ambulatory Visit: Payer: Self-pay

## 2018-09-08 ENCOUNTER — Encounter: Payer: Self-pay | Admitting: Licensed Clinical Social Worker

## 2018-09-08 ENCOUNTER — Ambulatory Visit (INDEPENDENT_AMBULATORY_CARE_PROVIDER_SITE_OTHER): Payer: PPO | Admitting: Licensed Clinical Social Worker

## 2018-09-08 DIAGNOSIS — F321 Major depressive disorder, single episode, moderate: Secondary | ICD-10-CM
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
## 2018-09-14 ENCOUNTER — Other Ambulatory Visit (HOSPITAL_COMMUNITY): Payer: Self-pay | Admitting: Cardiology

## 2018-09-14 ENCOUNTER — Other Ambulatory Visit: Payer: Self-pay | Admitting: Internal Medicine

## 2018-09-14 DIAGNOSIS — I5032 Chronic diastolic (congestive) heart failure: Secondary | ICD-10-CM

## 2018-09-15 ENCOUNTER — Other Ambulatory Visit: Payer: Self-pay

## 2018-09-15 ENCOUNTER — Encounter: Payer: Self-pay | Admitting: Licensed Clinical Social Worker

## 2018-09-15 ENCOUNTER — Ambulatory Visit (INDEPENDENT_AMBULATORY_CARE_PROVIDER_SITE_OTHER): Payer: PPO | Admitting: Licensed Clinical Social Worker

## 2018-09-15 DIAGNOSIS — F321 Major depressive disorder, single episode, moderate: Secondary | ICD-10-CM
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
## 2018-09-22 ENCOUNTER — Telehealth: Payer: Self-pay | Admitting: Licensed Clinical Social Worker

## 2018-09-22 NOTE — Telephone Encounter (Signed)
Patient was called to R/S her appointment due to the provider being out of the office. Patient did not answer, and a vm was left for the pt.

## 2018-09-23 ENCOUNTER — Ambulatory Visit: Payer: PPO | Admitting: Licensed Clinical Social Worker

## 2018-09-28 ENCOUNTER — Telehealth (INDEPENDENT_AMBULATORY_CARE_PROVIDER_SITE_OTHER): Payer: PPO | Admitting: Pharmacist

## 2018-09-28 DIAGNOSIS — Z7901 Long term (current) use of anticoagulants: Secondary | ICD-10-CM | POA: Diagnosis not present

## 2018-09-28 DIAGNOSIS — Z8719 Personal history of other diseases of the digestive system: Secondary | ICD-10-CM | POA: Diagnosis not present

## 2018-09-28 DIAGNOSIS — J439 Emphysema, unspecified: Secondary | ICD-10-CM

## 2018-09-28 DIAGNOSIS — I4891 Unspecified atrial fibrillation: Secondary | ICD-10-CM | POA: Diagnosis not present

## 2018-09-28 DIAGNOSIS — I480 Paroxysmal atrial fibrillation: Secondary | ICD-10-CM

## 2018-09-28 LAB — POCT INR: INR: 3.1 — AB (ref 2.0–3.0)

## 2018-09-28 NOTE — Progress Notes (Addendum)
Anticoagulation Management Kathryn A Elkholyis a 68 y.o.femalewho was contacted for monitoring of warfarintreatment.  Indication: atrial fibrillation (also history of GI bleed with AVM) Duration:indefinite [facility] [place]  Anticoagulation Clinic Visit History: Patientdoes notreport signs/symptoms of bleeding or thromboembolism   Anticoagulation Episode Summary    Current INR goal:  1.5-2.5  TTR:  81.8 % (5.3 y)  Next INR check:  10/04/2018  INR from last check:  3.1 (09/28/2018)  Weekly max warfarin dose:    Target end date:    INR check location:  Anticoagulation Clinic  Preferred lab:    Send INR reminders to:  [facility]   Indications   PAF (paroxysmal atrial fibrillation) ([place]) [I48.0] Long term current use of anticoagulant therapy [Z79.01]       Comments:        Anticoagulation Care Providers    Provider Role Specialty Phone number   Lelon Perla, MD  Cardiology [phone]     Allergies  Allergen Reactions  . Lorazepam Other (See Comments)    Patient's sister noted that ativan caused the patient to become extremely confused during hospitalization 09/2010; tolerates Xanax  . Morphine And Related Other (See Comments)    Injection site reaction  . Oxycontin [Oxycodone] Other (See Comments)    headache  . Tramadol Hcl Swelling    Ankle swelling    Current Outpatient Medications:  .  albuterol (PROAIR HFA) 108 (90 Base) MCG/ACT inhaler, Inhale 2 puffs into the lungs every 6 (six) hours as needed for shortness of breath., Disp: 3 Inhaler, Rfl: 3 .  ALPRAZolam (XANAX) 1 MG tablet, Take 1 tablet (1 mg) by mouth at bedtime as needed for insomnia.  May take 1/2 tablet (0.5 mg) by mouth during the day for anxiety., Disp: 55 tablet, Rfl: 5 .  benzonatate (TESSALON) 100 MG capsule, Take 1 capsule (100 mg total) by mouth 3 (three) times daily as needed for cough., Disp: 90 capsule, Rfl: 11 .  Blood Glucose Monitoring  Suppl (ONETOUCH VERIO) W/DEVICE KIT, 1 each by Does not apply route 4 (four) times daily., Disp: 1 kit, Rfl: 0 .  buPROPion (WELLBUTRIN) 75 MG tablet, Take 1 tablet (75 mg total) by mouth 2 (two) times daily., Disp: 180 tablet, Rfl: 3 .  chlorpheniramine (CHLOR-TRIMETON) 4 MG tablet, Take 4 mg by mouth daily as needed for allergies., Disp: , Rfl:  .  clobetasol cream (TEMOVATE) 3.89 %, Apply 1 application topically 2 (two) times daily as needed (lichen sclerosus flare)., Disp: 45 g, Rfl: 3 .  DULoxetine (CYMBALTA) 60 MG capsule, Take 1 capsule (60 mg total) by mouth 2 (two) times daily., Disp: 180 capsule, Rfl: 3 .  ferrous gluconate (FERGON) 324 MG tablet, Take 1 tablet (324 mg total) by mouth 2 (two) times daily with a meal. (Patient taking differently: Take 648 mg by mouth See admin instructions. Take 2 tablets (648 mg) by mouth daily with lunch or supper), Disp: 180 tablet, Rfl: 3 .  fluticasone (FLONASE) 50 MCG/ACT nasal spray, Place 2 sprays into both nostrils daily., Disp: 16 g, Rfl: 11 .  Fluticasone-Salmeterol (ADVAIR DISKUS) 500-50 MCG/DOSE AEPB, Inhale 1 puff into the lungs 2 (two) times daily., Disp: 180 each, Rfl: 3 .  furosemide (LASIX) 80 MG tablet, TAKE 1 TABLET BY MOUTH ONCE DAILY.  NEED APPOINTMENT FOR REFILLS, Disp: 30 tablet, Rfl: 0 .  gabapentin (NEURONTIN) 300 MG capsule, Take 2 capsules (600 mg total) by mouth 3 (three) times daily., Disp: 540 capsule, Rfl:  3 .  glucose blood (ONETOUCH VERIO) test strip, Use to check blood sugar 4 times daily. diag code E11.40. Insulin dependent, Disp: 375 each, Rfl: 1 .  HYDROcodone-acetaminophen (NORCO/VICODIN) 5-325 MG tablet, Take 1-2 tablets by mouth every 6 (six) hours as needed for severe pain., Disp: 115 tablet, Rfl: 0 .  hydrocortisone cream 1 %, Apply to affected area 2 times daily, Disp: 14 g, Rfl: 0 .  insulin aspart (NOVOLOG) 100 UNIT/ML FlexPen, INJECT 15 UNITS TO 25 UNITS SUBCUTANEOUSLY 4 TIMES DAILY BEFORE MEALS AND AT BEDTIME,  Disp: 15 mL, Rfl: 4 .  Insulin Pen Needle 32G X 4 MM MISC, Use to inject insulin up to 6 times a day, Disp: 500 each, Rfl: 3 .  Insulin Syringe-Needle U-100 31G X 15/64" 0.5 ML MISC, Use to inject insulin up to 4 times a day, Disp: 400 each, Rfl: 3 .  ipratropium-albuterol (DUONEB) 0.5-2.5 (3) MG/3ML SOLN, INHALE THE CONTENTS OF 1 VIAL VIA NEBULIZER EVERY 6 HOURS AS NEEDED FOR SHORTNESS OF BREATH, Disp: 360 mL, Rfl: 3 .  Lancets Misc. (ACCU-CHEK FASTCLIX LANCET) KIT, Check your blood 4 times a day dx code 250.00 insulin requiring, Disp: 1 kit, Rfl: 2 .  liraglutide (VICTOZA) 18 MG/3ML SOPN, Inject 0.3 mLs (1.8 mg total) into the skin daily., Disp: 3 pen, Rfl: 11 .  metoprolol succinate (TOPROL-XL) 50 MG 24 hr tablet, Take 1 tablet (50 mg total) by mouth daily., Disp: 90 tablet, Rfl: 3 .  nicotine (NICODERM CQ - DOSED IN MG/24 HOURS) 14 mg/24hr patch, Place 1 patch (14 mg total) onto the skin daily., Disp: 28 patch, Rfl: 0 .  nystatin cream (MYCOSTATIN), Apply 1 application topically 2 (two) times daily as needed (rash). , Disp: , Rfl: 3 .  omeprazole (PRILOSEC) 40 MG capsule, Take 1 capsule (40 mg total) by mouth 2 (two) times daily., Disp: 180 capsule, Rfl: 3 .  ondansetron (ZOFRAN) 4 MG tablet, TAKE 1 TABLET BY MOUTH EVERY 8 HOURS AS NEEDED FOR NAUSEA OR VOMITING, Disp: 20 tablet, Rfl: 2 .  OXYGEN, Inhale 2 L into the lungs as needed (for shortness of breath while active). , Disp: , Rfl:  .  potassium chloride SA (K-DUR) 20 MEQ tablet, Take 1 tablet (20 mEq total) by mouth 2 (two) times daily., Disp: 180 tablet, Rfl: 3 .  PRESCRIPTION MEDICATION, Inhale into the lungs at bedtime. CPAP, Disp: , Rfl:  .  rosuvastatin (CRESTOR) 20 MG tablet, Take 1 tablet (20 mg total) by mouth at bedtime., Disp: 90 tablet, Rfl: 3 .  Tiotropium Bromide Monohydrate (SPIRIVA RESPIMAT) 2.5 MCG/ACT AERS, Inhale 2 puffs into the lungs daily., Disp: 1 Inhaler, Rfl: 11 .  warfarin (COUMADIN) 2.5 MG tablet, Take 1 tablet (2.5  mg total) by mouth daily at 6 PM. Except no warfarin on Mondays, Disp: 90 tablet, Rfl: 0  Current Facility-Administered Medications:  .  senna-docusate (Senokot-S) tablet 1 tablet, 1 tablet, Oral, QHS PRN, Heber Norton, Elza Rafter, DO Past Medical History:  Diagnosis Date  . Adenomatous polyps 05/14/2011   Colonoscopy (05/2011): 4 mm adenomatous polyp excised endoscopically Colonoscopy (02/2002): Adenomatous polyp excised endoscopically   . Allergic rhinitis 06/01/2012  . Anemia of chronic disease 01/01/2013  . Anxiety 07/24/2010  . Aortic atherosclerosis ([place]) 10/19/2014   Seen on CT scan, currently asymptomatic  . 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 Arthritis    "lower back; hands" (02/19/2018)  . Asymptomatic cholelithiasis 09/25/2015  Seen on CT scan 08/2015  . Carotid artery stenosis    s/p right endarterectomy (06/2010) Carotid US (07/2010):  Left: Moderate-to-severe (60-79%) calcific and non-calcific plaque origin and proximal ICA and ECA   . Chronic congestive heart failure with left ventricular diastolic dysfunction ([place]) 10/21/2010  . Chronic constipation 02/03/2011  . Chronic daily headache 01/16/2014  . Chronic headache    "weekly now" (11/13/2017)  . Chronic low back pain 10/06/2012  . Chronic venous insufficiency 08/04/2012  . Clear cell renal cell carcinoma ([place]) 07/21/2011   s/p cryoablation of left RCC in 09/2011 by [name]. Followed by [name]  [facility]) .    Marland Kitchen COPD (chronic obstructive pulmonary disease) with emphysema (HCC)    PFTs 02/2012: FEV1 0.92 (40%), ratio 69, 27% increase in FEV1 with BD, TLC 91%, severe airtrapping, DLCO49% On chronic home O2. Pulmonary rehab referral 05/2012   . Daily headache    "recently" (02/19/2018)  . Depression 11/19/2005  . Esophageal stenosis 08/08/2015   Mild, benign-appearing on EGD 06/29/2015  . Fibromyalgia 08/29/2010  .  Gastroesophageal reflux disease   . History of blood transfusion    "several times"  (02/19/2018)  . History of hiatal hernia   . History of kidney stones   . Hyperlipidemia LDL goal < 100 11/20/2005  . Hypertension   . Internal hemorrhoids 08/04/2012  . Lichen sclerosus of female genitalia 01/12/2017  . Migraine    "none in years" (02/19/2018)  . Mitral stenosis    s/p Mitral valve replacement with a 27-mm pericardial porcine valve (Medtronic Mosaic valve, serial #[id number] on 09/20/10, Dr. Prescott Gum)   . Moderate to severe pulmonary hypertension ([place])    2014 TEE w PA peak pressure 46 mmHg, s/p MV replacement   . Moderately severe major depression ([place]) 11/19/2005  . Obesity (BMI 30.0-34.9) 10/23/2011  . Obstructive sleep apnea    Nocturnal polysomnography (06/2009): Moderate sleep apnea/ hypopnea syndrome , AHI 17.8 per hour with nonpositional hypopneas. CPAP titration to 12 CWP, AHI 2.4 per hour. On nocturnal CPAP via a small resMed Quattro full-face mask with heated humidifier.   . On home oxygen therapy    "2L O2 w/CPAP when I sleep" (02/19/2018)  . OSA on CPAP    "2L O2 w/CPAP when I sleep" (02/19/2018)  . Osteoporosis    DEXA (12/09/2011): L-spine T -3.7, left hip T -1.4 DEXA (12/2004): L-spine T -2.6, left hip -0.1   . Paroxysmal atrial fibrillation ([place]) 10/22/2010   s/p Left atrial maze procedure for paroxysmal atrial fibrillation on 09/20/2010 by Dr Prescott Gum.  Subsequent splenic infarct, decision was made to re-anticoagulate with coumadin, likely life-long as this is the most likely cause of the splenic infarct.   . Pneumonia    "once"  (02/19/2018)  . Pulmonary hypertension due to chronic obstructive pulmonary disease ([place]) 04/25/2016  . Right nephrolithiasis 09/06/2014   5 mm non-obstructing calculus seen on CT scan 09/05/2014   . Seborrheic keratosis 09/28/2015  . Severe obesity (BMI 35.0-39.9) with comorbidity ([place]) 10/23/2011  . Shortness of breath dyspnea   . Tobacco abuse  07/28/2012  . Type 2 diabetes mellitus with diabetic neuropathy (HCC)    Social History   Socioeconomic History  . Marital status: Divorced    Spouse name: Not on file  . Number of children: Not on file  . Years of education: Not on file  . Highest education level: Not on file  Occupational History  . Not on file  Social Needs  .  Financial resource strain: Not on file  . Food insecurity    Worry: Not on file    Inability: Not on file  . Transportation needs    Medical: Not on file    Non-medical: Not on file  Tobacco Use  . Smoking status: Current Every Day Smoker    Packs/day: 1.00    Years: 55.00    Pack years: 55.00    Types: Cigarettes    Start date: 11/01/1962  . Smokeless tobacco: Never Used  Substance and Sexual Activity  . Alcohol use: Not Currently    Comment: 02/19/2018 "nothing since 1999"  . Drug use: Not Currently    Types: Cocaine, Marijuana    Comment: 02/19/2018 "nothing since the 1990s"  . Sexual activity: Not Currently    Birth control/protection: Post-menopausal  Lifestyle  . Physical activity    Days per week: Not on file    Minutes per session: Not on file  . Stress: Not on file  Relationships  . Social Herbalist on phone: Not on file    Gets together: Not on file    Attends religious service: Not on file    Active member of club or organization: Not on file    Attends meetings of clubs or organizations: Not on file    Relationship status: Not on file  Other Topics Concern  . Not on file  Social History Narrative   Lives [place] in [place]   Worked at [employer] for 18 years   No car   Family History  Problem Relation Age of Onset  . Peptic Ulcer Disease Father   . Heart attack Father 44       Died of MI at age 30  . Heart attack Brother 31       Died of MI at age 48  . Obesity Brother   . Pneumonia Mother   . Healthy Sister   . Lupus Daughter   . Obsessive Compulsive Disorder Daughter     ASSESSMENT Recent Results: Lab Results  Component Value Date   INR 3.1 (A) 09/28/2018   INR 2.0 07/15/2018   INR 2.4 02/19/2018   Anticoagulation Dosing: Description   Take ONE (1)  tablet of your 2.60m green-colored warfarin tablet by mouth, once-daily at 6PM all days of week, EXCEPT on Mondays. Do NOT take any warfarin on Mondays of each week.      INR today: supratherapeutic  PLAN Weekly dose was decreased by 8% to 13.75 mg per week (from 15 mg weekly)  There are no Patient Instructions on file for this visit. Patient advised to contact clinic or seek medical attention if signs/symptoms of bleeding or thromboembolism occur.  Patient verbalized understanding by repeating back information and was advised to contact me if further medication-related questions arise. Patient was also provided an information handout.  Follow-up 1 week  JFlossie Dibble

## 2018-09-29 ENCOUNTER — Ambulatory Visit (INDEPENDENT_AMBULATORY_CARE_PROVIDER_SITE_OTHER): Payer: PPO | Admitting: Licensed Clinical Social Worker

## 2018-09-29 ENCOUNTER — Encounter: Payer: Self-pay | Admitting: Licensed Clinical Social Worker

## 2018-09-29 ENCOUNTER — Other Ambulatory Visit: Payer: Self-pay

## 2018-09-29 ENCOUNTER — Ambulatory Visit: Payer: PPO

## 2018-09-29 DIAGNOSIS — J449 Chronic obstructive pulmonary disease, unspecified: Secondary | ICD-10-CM | POA: Diagnosis not present

## 2018-09-29 DIAGNOSIS — I050 Rheumatic mitral stenosis: Secondary | ICD-10-CM | POA: Diagnosis not present

## 2018-09-29 DIAGNOSIS — I509 Heart failure, unspecified: Secondary | ICD-10-CM | POA: Diagnosis not present

## 2018-09-29 DIAGNOSIS — F321 Major depressive disorder, single episode, moderate: Secondary | ICD-10-CM

## 2018-09-29 NOTE — BH Specialist Note (Signed)
Integrated Behavioral Health Visit via Telemedicine (Telephone)  09/29/2018 [name] [id number]   Session Start time: 10:35  Session End time: 11:00 Total time: 25 minutes  Referring Provider: Dr. Sharon Seller Type of Visit: Telephonic Patient location: Home [facility] Provider location: Office All persons participating in visit: patient and [facility]  Confirmed patient's address: Yes  Confirmed patient's phone number: Yes  Any changes to demographics: No   Discussed confidentiality: Yes    The following statements were read to the patient and/or legal guardian that are established with the [facility] Provider.  "The purpose of this phone visit is to provide behavioral health care while limiting exposure to the coronavirus (COVID19).  There is a possibility of technology failure and discussed alternative modes of communication if that failure occurs."  "By engaging in this telephone visit, you consent to the provision of healthcare.  Additionally, you authorize for your insurance to be billed for the services provided during this telephone visit."   Patient and/or legal guardian consented to telephone visit: Yes   PRESENTING CONCERNS: Patient and/or family reports the following symptoms/concerns: memory issues, anxiety, moderate levels of depression, chronic health, and lack of natural supports.  Duration of problem: increased over 8 years; Severity of problem: moderate   GOALS ADDRESSED: Patient will: 1.  Reduce symptoms of: anxiety, depression, mood instability and stress  2.  Increase knowledge and/or ability of: coping skills, healthy habits and stress reduction  3.  Demonstrate ability to: Increase healthy adjustment to current life circumstances and Increase adequate support systems for patient/family  INTERVENTIONS: Interventions utilized:  Brief CBT and Supportive Counseling Standardized Assessments completed: assessed for SI, HI, and self-harm.  ASSESSMENT: Patient  currently experiencing ongoing depression. Patient processed her pain levels, and how she feels her pain keeps her from functioning. Patient reported she is in pain for the first hour of her day, and she is unable to perform any daily tasks.  Patient reported that she has to sit down frequently. Patient reported she does not want a nurse aid because they are not helpful.   Patient continues to have family dynamic issues. Patient continues to help people in her family, and she feels they do not appreciate her help. Patient does not want to change her behaviors at this time. Patient lacks natural supports. Patient has issues with trust.   Patient may benefit from weekly outpatient.  PLAN: 1. Follow up with behavioral health clinician on : two weeks.   Dessie Coma, [facility], [place]

## 2018-10-01 ENCOUNTER — Encounter: Payer: Self-pay | Admitting: *Deleted

## 2018-10-04 ENCOUNTER — Telehealth: Payer: Self-pay

## 2018-10-04 ENCOUNTER — Encounter: Payer: Self-pay | Admitting: Internal Medicine

## 2018-10-04 NOTE — Telephone Encounter (Signed)
rtc to pt, she needed reassurance that she has "not been blackballed from clinic" she states she saw where her appt was changed and felt like someone possibly was trying to tell her something. She was reassured no, that pcp schedule had changed and that pcp enjoys seeing her as a pt. She felt better. She does state her pain lately has been worse and will need to talk to dr Software engineer about this.

## 2018-10-04 NOTE — Telephone Encounter (Signed)
Will also send letter thanks

## 2018-10-04 NOTE — Telephone Encounter (Signed)
Requesting to speak with [name]. Please call pt back.  

## 2018-10-07 ENCOUNTER — Telehealth: Payer: Self-pay | Admitting: Internal Medicine

## 2018-10-07 NOTE — Telephone Encounter (Signed)
Spoke w/ pt earlier, she had stopped her furosemide and experienced some edema in BLE and some shortness of breath, she restarted her furosemide and feels much better, she is advised that if this does not completely resolve this weekend to please call 911 and not hesitate, she is agreeable

## 2018-10-07 NOTE — Telephone Encounter (Signed)
Pt requesting a callback [phone]

## 2018-10-11 ENCOUNTER — Telehealth: Payer: Self-pay | Admitting: Pharmacist

## 2018-10-11 ENCOUNTER — Telehealth: Payer: Self-pay | Admitting: *Deleted

## 2018-10-11 NOTE — Telephone Encounter (Signed)
Will call patient tonight from home. INR in target range defined (1.5 - 2.5).

## 2018-10-11 NOTE — Telephone Encounter (Signed)
Patient peforming PST FS POC INRs. Reports value of 2.3 on 13.37m warfarin/wk as:  NO tablet(s) on MONDAYS; 1/2 tablet of her 2.553mstrength warfarin tablet on TUESDAYS; All other days, ONE (1) x 2.36m31marfarin. Repeat PST in 1 wk. No bleeding endorsed. No report of embolic events; no new medications per her self-assessment. States she has adequate supply of warfarin on hand. Provided my phone number for her to text me her PST FS POC Results earlier in day so they can be reacted to "real-time". This result was made available to me at approximately 5:00PM by the Triage Nurse. I spoke with the patient.

## 2018-10-11 NOTE — Telephone Encounter (Signed)
Received fax from [facility] self testing service.   INR 2.3 done today 8/31. Result given to Dr Elie Confer. Thanks

## 2018-10-12 ENCOUNTER — Telehealth: Payer: Self-pay | Admitting: *Deleted

## 2018-10-12 NOTE — Telephone Encounter (Signed)
[name], pt continues to worry about her sweat problem, states she has noticed after eating sometimes it gets worse. She would like to speak with you about the r/t eating and sweating

## 2018-10-13 ENCOUNTER — Other Ambulatory Visit: Payer: Self-pay | Admitting: Internal Medicine

## 2018-10-13 ENCOUNTER — Encounter: Payer: Self-pay | Admitting: Licensed Clinical Social Worker

## 2018-10-13 ENCOUNTER — Other Ambulatory Visit (HOSPITAL_COMMUNITY): Payer: Self-pay | Admitting: Cardiology

## 2018-10-13 ENCOUNTER — Ambulatory Visit (INDEPENDENT_AMBULATORY_CARE_PROVIDER_SITE_OTHER): Payer: PPO | Admitting: Licensed Clinical Social Worker

## 2018-10-13 DIAGNOSIS — F321 Major depressive disorder, single episode, moderate: Secondary | ICD-10-CM

## 2018-10-13 DIAGNOSIS — I5032 Chronic diastolic (congestive) heart failure: Secondary | ICD-10-CM

## 2018-10-13 DIAGNOSIS — I480 Paroxysmal atrial fibrillation: Secondary | ICD-10-CM

## 2018-10-13 NOTE — BH Specialist Note (Signed)
Integrated Behavioral Health Visit via Telemedicine (Telephone)  10/13/2018 [name] [id number]   Session Start time: 10:40  Session End time: 11:10 Total time: 30 minutes  Referring Provider: Dr. Sharon Seller Type of Visit: Telephonic Patient location: Home [facility] Provider location: Office All persons participating in visit: [facility] and patient  Confirmed patient's address: Yes  Confirmed patient's phone number: Yes  Any changes to demographics: No   Discussed confidentiality: Yes    The following statements were read to the patient and/or legal guardian that are established with the [facility] Provider.  "The purpose of this phone visit is to provide behavioral health care while limiting exposure to the coronavirus (COVID19).  There is a possibility of technology failure and discussed alternative modes of communication if that failure occurs."  "By engaging in this telephone visit, you consent to the provision of healthcare.  Additionally, you authorize for your insurance to be billed for the services provided during this telephone visit."   Patient and/or legal guardian consented to telephone visit: Yes   PRESENTING CONCERNS: Patient and/or family reports the following symptoms/concerns: memory issues, anxiety, moderate levels of depression, chronic health, and lack of natural resources.  Duration of problem: increased over the past 8 years; Severity of problem: moderate  GOALS ADDRESSED: Patient will: 1.  Reduce symptoms of: anxiety, depression, mood instability and stress  2.  Increase knowledge and/or ability of: coping skills, healthy habits and stress reduction  3.  Demonstrate ability to: Increase healthy adjustment to current life circumstances and Increase adequate support systems for patient/family  INTERVENTIONS: Interventions utilized:  Behavioral Activation, Brief CBT, Supportive Counseling and Sleep Hygiene Standardized Assessments completed: assessed for  SI, HI, and self-harm.  ASSESSMENT: Patient currently experiencing depression and chronic pain. Patient reported that she stays inside all of the time, and limits the amount of people she will allow in her home. Patient denies loneliness, and says she has accepted her reality. Patient described her reality as "nothing". Patient was very tired during the session, and reported that she did not sleep the night before. Patient reported that she sleeps throughout the day.   Patient reported improvement with her relationship with one of her grand-daughters, and feels this is due to her giving her money. Patient reported that her sister continues to spend minimal time with her, and this is upsetting to her. Patient was able to see the ways that her sister is helpful, and benefited from redirection from the therapist. Patient continues to have anxiety about her health, and spends a great deal of time trying to figure out solutions.   Patient may benefit from counseling.   PLAN: 1. Follow up with behavioral health clinician on : one week.   Dessie Coma, [facility], [place]

## 2018-10-14 ENCOUNTER — Other Ambulatory Visit: Payer: Self-pay | Admitting: Internal Medicine

## 2018-10-14 NOTE — Telephone Encounter (Signed)
Rtc, states med is fixed

## 2018-10-14 NOTE — Telephone Encounter (Signed)
Rec'd phone call from pt's [employer] Representative Bobby Rumpf) Request a Refill on the patient's Medication. Pls call [phone]  Pt ID for her new ins is [id number] is needed when calling.  Pt must start a new Prescription to fill.   HYDROcodone-acetaminophen (NORCO/VICODIN) 5-325 MG tablet

## 2018-10-14 NOTE — Telephone Encounter (Signed)
Pt calling back

## 2018-10-14 NOTE — Telephone Encounter (Signed)
Called pharmacy and insurance, they both first stated needed PA but there is a glitch in the system because pt changed insurance plans Rep states that there should not need PA that pharmacy needed to do an override and she will call [facility] to fix this.

## 2018-10-15 ENCOUNTER — Other Ambulatory Visit: Payer: Self-pay | Admitting: *Deleted

## 2018-10-15 DIAGNOSIS — F322 Major depressive disorder, single episode, severe without psychotic features: Secondary | ICD-10-CM

## 2018-10-15 DIAGNOSIS — J439 Emphysema, unspecified: Secondary | ICD-10-CM

## 2018-10-19 ENCOUNTER — Telehealth: Payer: Self-pay | Admitting: Pharmacist

## 2018-10-19 MED ORDER — ALBUTEROL SULFATE HFA 108 (90 BASE) MCG/ACT IN AERS: 2.0000 | INHALATION_SPRAY | Freq: Four times a day (QID) | RESPIRATORY_TRACT | 5 refills | Status: DC | PRN

## 2018-10-19 MED ORDER — BUPROPION HCL 75 MG PO TABS: 75.0000 mg | ORAL_TABLET | Freq: Two times a day (BID) | ORAL | 3 refills | Status: DC

## 2018-10-19 NOTE — Telephone Encounter (Signed)
Received text from patient who performs PST FS POC INR determinations. Result from 18-Oct-2018 at 2:18PM [place]:  2.5 on 2.40m warfarin QD--EXCEPT: on Mondays, she takes NO TABLETS; on Tuesdays, she takes ONLY 1/2 x 2.53mwarfarin. Goal INR 1.5 - 2.5. Patient was advised to CONTINUE this regimen and repeat PST FS POC INR at next agreed upon interval between her and device provider. She reports no signs or symptoms of bleeding or embolic events. She states she has adequate supply of her green, 2.33m51mtrength warfarin tablets.

## 2018-10-19 NOTE — Telephone Encounter (Signed)
2.5 on 2.23m warfarin QD--EXCEPT: on Mondays, she takes NO TABLETS; on Tuesdays, she takes ONLY 1/2 x 2.573mwarfarin.

## 2018-10-21 ENCOUNTER — Emergency Department (HOSPITAL_COMMUNITY)
Admission: EM | Admit: 2018-10-21 | Discharge: 2018-10-22 | Disposition: A | Discharge status: Home / Self Care | Payer: HMO | Attending: Emergency Medicine | Admitting: Emergency Medicine

## 2018-10-21 ENCOUNTER — Other Ambulatory Visit: Payer: Self-pay

## 2018-10-21 ENCOUNTER — Ambulatory Visit: Payer: PPO | Admitting: Internal Medicine

## 2018-10-21 ENCOUNTER — Encounter (HOSPITAL_COMMUNITY): Payer: Self-pay | Admitting: Emergency Medicine

## 2018-10-21 DIAGNOSIS — I10 Essential (primary) hypertension: Secondary | ICD-10-CM | POA: Diagnosis not present

## 2018-10-21 DIAGNOSIS — T17920A Food in respiratory tract, part unspecified causing asphyxiation, initial encounter: Secondary | ICD-10-CM | POA: Diagnosis not present

## 2018-10-21 DIAGNOSIS — Z952 Presence of prosthetic heart valve: Secondary | ICD-10-CM | POA: Diagnosis not present

## 2018-10-21 DIAGNOSIS — R040 Epistaxis: Secondary | ICD-10-CM

## 2018-10-21 DIAGNOSIS — E119 Type 2 diabetes mellitus without complications: Secondary | ICD-10-CM | POA: Diagnosis not present

## 2018-10-21 DIAGNOSIS — E669 Obesity, unspecified: Secondary | ICD-10-CM | POA: Insufficient documentation

## 2018-10-21 DIAGNOSIS — Z6832 Body mass index (BMI) 32.0-32.9, adult: Secondary | ICD-10-CM | POA: Diagnosis not present

## 2018-10-21 DIAGNOSIS — Z794 Long term (current) use of insulin: Secondary | ICD-10-CM | POA: Diagnosis not present

## 2018-10-21 DIAGNOSIS — I110 Hypertensive heart disease with heart failure: Secondary | ICD-10-CM | POA: Diagnosis not present

## 2018-10-21 DIAGNOSIS — I5032 Chronic diastolic (congestive) heart failure: Secondary | ICD-10-CM | POA: Insufficient documentation

## 2018-10-21 DIAGNOSIS — Z79899 Other long term (current) drug therapy: Secondary | ICD-10-CM | POA: Diagnosis not present

## 2018-10-21 DIAGNOSIS — Z7901 Long term (current) use of anticoagulants: Secondary | ICD-10-CM | POA: Insufficient documentation

## 2018-10-21 DIAGNOSIS — R0902 Hypoxemia: Secondary | ICD-10-CM | POA: Diagnosis not present

## 2018-10-21 DIAGNOSIS — R52 Pain, unspecified: Secondary | ICD-10-CM | POA: Diagnosis not present

## 2018-10-21 DIAGNOSIS — F17210 Nicotine dependence, cigarettes, uncomplicated: Secondary | ICD-10-CM | POA: Insufficient documentation

## 2018-10-21 DIAGNOSIS — I959 Hypotension, unspecified: Secondary | ICD-10-CM | POA: Diagnosis not present

## 2018-10-21 DIAGNOSIS — J449 Chronic obstructive pulmonary disease, unspecified: Secondary | ICD-10-CM | POA: Insufficient documentation

## 2018-10-21 LAB — I-STAT CHEM 8, ED
BUN: 10 mg/dL (ref 8–23)
Calcium, Ion: 1.23 mmol/L (ref 1.15–1.40)
Chloride: 101 mmol/L (ref 98–111)
Creatinine, Ser: 0.8 mg/dL (ref 0.44–1.00)
Glucose, Bld: 123 mg/dL — ABNORMAL HIGH (ref 70–99)
HCT: 33 % — ABNORMAL LOW (ref 36.0–46.0)
Hemoglobin: 11.2 g/dL — ABNORMAL LOW (ref 12.0–15.0)
Potassium: 4.1 mmol/L (ref 3.5–5.1)
Sodium: 140 mmol/L (ref 135–145)
TCO2: 27 mmol/L (ref 22–32)

## 2018-10-21 LAB — PROTIME-INR
INR: 1.5 — ABNORMAL HIGH (ref 0.8–1.2)
Prothrombin Time: 17.8 seconds — ABNORMAL HIGH (ref 11.4–15.2)

## 2018-10-21 NOTE — ED Triage Notes (Signed)
Per EMS: After dinner, pt had a lt sided nose bleed which has since resolved.  Now complaining of left sinus pressure.

## 2018-10-22 ENCOUNTER — Telehealth: Payer: Self-pay | Admitting: Internal Medicine

## 2018-10-22 MED ORDER — ACETAMINOPHEN 500 MG PO TABS
1000.0000 mg | ORAL_TABLET | Freq: Once | ORAL | Status: DC
  Filled 2018-10-22: qty 2

## 2018-10-22 MED ORDER — OXYMETAZOLINE HCL 0.05 % NA SOLN
1.0000 | Freq: Once | NASAL | Status: AC
  Administered 2018-10-22: 1 via NASAL
  Filled 2018-10-22: qty 30

## 2018-10-22 NOTE — Telephone Encounter (Signed)
Pt requesting Bonnita Nasuti to callback [phone]

## 2018-10-22 NOTE — ED Provider Notes (Signed)
[facility] DEPT Provider Note  CSN: [id number] Arrival date & time: 10/21/18 2100  Chief Complaint(s) Epistaxis  HPI [name] is a 68 y.o. female   The history is provided by the patient.  Epistaxis Location:  L nare Severity:  Mild Duration: 30-45 min. Timing:  Intermittent Progression:  Resolved Chronicity:  New Context comment:  Choked up on food, causing her cough vigorously which triggered her nose bleed Relieved by:  Applying pressure Worsened by:  Nothing Associated symptoms: sinus pain   Associated symptoms: no fever and no headaches   Risk factors: no alcohol use     Past Medical History Past Medical History:  Diagnosis Date  . Adenomatous polyps 05/14/2011   Colonoscopy (05/2011): 4 mm adenomatous polyp excised endoscopically Colonoscopy (02/2002): Adenomatous polyp excised endoscopically   . Allergic rhinitis 06/01/2012  . Anemia of chronic disease 01/01/2013  . Anxiety 07/24/2010  . Aortic atherosclerosis ([place]) 10/19/2014   Seen on CT scan, currently asymptomatic  . 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 Arthritis    "lower back; hands" (02/19/2018)  . Asymptomatic cholelithiasis 09/25/2015   Seen on CT scan 08/2015  . Carotid artery stenosis    s/p right endarterectomy (06/2010) Carotid US (07/2010):  Left: Moderate-to-severe (60-79%) calcific and non-calcific plaque origin and proximal ICA and ECA   . Chronic congestive heart failure with left ventricular diastolic dysfunction ([place]) 10/21/2010  . Chronic constipation 02/03/2011  . Chronic daily headache 01/16/2014  . Chronic headache    "weekly now" (11/13/2017)  . Chronic low back pain 10/06/2012  . Chronic venous insufficiency 08/04/2012  . Clear cell renal cell carcinoma ([place]) 07/21/2011   s/p cryoablation of left RCC in 09/2011 by [name]. Followed by [name]  [facility]  Urology) .    Marland Kitchen COPD (chronic obstructive pulmonary disease) with emphysema (HCC)    PFTs 02/2012: FEV1 0.92 (40%), ratio 69, 27% increase in FEV1 with BD, TLC 91%, severe airtrapping, DLCO49% On chronic home O2. Pulmonary rehab referral 05/2012   . Daily headache    "recently" (02/19/2018)  . Depression 11/19/2005  . Esophageal stenosis 08/08/2015   Mild, benign-appearing on EGD 06/29/2015  . Fibromyalgia 08/29/2010  . Gastroesophageal reflux disease   . History of blood transfusion    "several times"  (02/19/2018)  . History of hiatal hernia   . History of kidney stones   . Hyperlipidemia LDL goal < 100 11/20/2005  . Hypertension   . Internal hemorrhoids 08/04/2012  . Lichen sclerosus of female genitalia 01/12/2017  . Migraine    "none in years" (02/19/2018)  . Mitral stenosis    s/p Mitral valve replacement with a 27-mm pericardial porcine valve (Medtronic Mosaic valve, serial #[id number] on 09/20/10, Dr. Prescott Gum)   . Moderate to severe pulmonary hypertension ([place])    2014 TEE w PA peak pressure 46 mmHg, s/p MV replacement   . Moderately severe major depression ([place]) 11/19/2005  . Obesity (BMI 30.0-34.9) 10/23/2011  . Obstructive sleep apnea    Nocturnal polysomnography (06/2009): Moderate sleep apnea/ hypopnea syndrome , AHI 17.8 per hour with nonpositional hypopneas. CPAP titration to 12 CWP, AHI 2.4 per hour. On nocturnal CPAP via a small resMed Quattro full-face mask with heated humidifier.   . On home oxygen therapy    "2L O2 w/CPAP when I sleep" (02/19/2018)  . OSA on CPAP    "2L O2 w/CPAP when I sleep" (02/19/2018)  .  Osteoporosis    DEXA (12/09/2011): L-spine T -3.7, left hip T -1.4 DEXA (12/2004): L-spine T -2.6, left hip -0.1   . Paroxysmal atrial fibrillation ([place]) 10/22/2010   s/p Left atrial maze procedure for paroxysmal atrial fibrillation on 09/20/2010 by Dr Prescott Gum.  Subsequent splenic infarct, decision was made to re-anticoagulate with coumadin, likely life-long as this is  the most likely cause of the splenic infarct.   . Pneumonia    "once"  (02/19/2018)  . Pulmonary hypertension due to chronic obstructive pulmonary disease ([place]) 04/25/2016  . Right nephrolithiasis 09/06/2014   5 mm non-obstructing calculus seen on CT scan 09/05/2014   . Seborrheic keratosis 09/28/2015  . Severe obesity (BMI 35.0-39.9) with comorbidity ([place]) 10/23/2011  . Shortness of breath dyspnea   . Tobacco abuse 07/28/2012  . Type 2 diabetes mellitus with diabetic neuropathy [facility])    Patient Active Problem List   Diagnosis Date Noted  . Atypical chest pain 08/18/2018  . Lichen sclerosus of female genitalia 01/12/2017  . Mixed stress and urge urinary incontinence 05/27/2016  . Pulmonary hypertension due to chronic obstructive pulmonary disease ([place]) 04/25/2016  . Right ventricular failure ([place]) 04/25/2016  . Seborrheic keratosis 09/28/2015  . Asymptomatic cholelithiasis 09/25/2015  . Esophageal stenosis 08/08/2015  . Arteriovenous malformation of gastrointestinal tract 08/08/2015  . Aortic atherosclerosis ([place]) 10/19/2014  . Right nephrolithiasis 09/06/2014  . Vulvovaginal candidiasis 09/20/2013  . Chronic daily headache 01/14/2013  . Chronic low back pain 10/06/2012  . Long term current use of anticoagulant therapy 09/30/2012  . Bilateral cataracts 08/04/2012  . Internal and external hemorrhoids without complication 80/88/1103  . Chronic venous insufficiency 08/04/2012  . Tobacco abuse 07/28/2012  . Allergic rhinitis 06/01/2012  . Obesity (BMI 30.0-34.9) 10/23/2011  . Clear cell renal cell carcinoma ([place]) 07/21/2011  . Adenomatous polyps 05/14/2011  . Health care maintenance 11/04/2010  . PAF (paroxysmal atrial fibrillation) ([place]) 10/22/2010  . Chronic congestive heart failure with left ventricular diastolic dysfunction ([place]) 10/21/2010  . Fibromyalgia 08/29/2010  . Gastroesophageal reflux disease   . Type 2 diabetes mellitus with diabetic neuropathy ([place])   . Pulmonary  emphysema ([place])   . Pulmonary hypertension due to left ventricular diastolic dysfunction ([place])   . Osteoporosis   . OSA (obstructive sleep apnea)   . Mitral stenosis   . Stenosis of right carotid artery   . Essential hypertension 06/19/2008  . Hyperlipidemia 11/20/2005  . Moderate major depression ([place]) 11/19/2005   Home Medication(s) Prior to Admission medications   Medication Sig Start Date End Date Taking? Authorizing Provider  albuterol (PROAIR HFA) 108 (90 Base) MCG/ACT inhaler Inhale 2 puffs into the lungs every 6 (six) hours as needed for shortness of breath. 10/19/18   Bartholomew Crews, MD  ALPRAZolam [name]) 1 MG tablet Take 1 tablet (1 mg) by mouth at bedtime as needed for insomnia.  May take 1/2 tablet (0.5 mg) by mouth during the day for anxiety. 05/28/18   Sid Falcon, MD  benzonatate (TESSALON) 100 MG capsule Take 1 capsule (100 mg total) by mouth 3 (three) times daily as needed for cough. 01/15/18   [name], MD  Blood Glucose Monitoring Suppl (ONETOUCH VERIO) W/DEVICE KIT 1 each by Does not apply route 4 (four) times daily. 07/25/14   [name], MD  buPROPion (WELLBUTRIN) 75 MG tablet Take 1 tablet (75 mg total) by mouth 2 (two) times daily. 10/19/18   Bartholomew Crews, MD  chlorpheniramine (CHLOR-TRIMETON) 4 MG tablet Take 4 mg by  mouth daily as needed for allergies.    [provider]  clobetasol cream (TEMOVATE) 0.38 % Apply 1 application topically 2 (two) times daily as needed (lichen sclerosus flare). 01/15/18   [name], MD  DULoxetine (CYMBALTA) 60 MG capsule Take 1 capsule (60 mg total) by mouth 2 (two) times daily. 12/11/17   [name], MD  ferrous gluconate (FERGON) 324 MG tablet Take 1 tablet (324 mg total) by mouth 2 (two) times daily with a meal. Patient taking differently: Take 648 mg by mouth See admin instructions. Take 2 tablets (648 mg) by mouth daily with lunch or supper 12/05/16   [name], MD  fluticasone  [facility]) 50 MCG/ACT nasal spray Place 2 sprays into both nostrils daily. 03/03/18   [name], MD  Fluticasone-Salmeterol (ADVAIR DISKUS) 500-50 MCG/DOSE AEPB Inhale 1 puff into the lungs 2 (two) times daily. 02/25/18   [name], MD  furosemide (LASIX) 80 MG tablet TAKE 1 TABLET BY MOUTH ONCE DAILY.  NEED APPOINTMENT FOR REFILLS 10/14/18   [name], MD  gabapentin (NEURONTIN) 300 MG capsule Take 2 capsules (600 mg total) by mouth 3 (three) times daily. 08/18/18   Bartholomew Crews, MD  glucose blood (ONETOUCH VERIO) test strip Use to check blood sugar 4 times daily. diag code E11.40. Insulin dependent 12/11/17   [name], MD  HYDROcodone-acetaminophen (NORCO/VICODIN) 5-325 MG tablet Take 1-2 tablets by mouth every 6 (six) hours as needed for severe pain. 08/19/18   Bartholomew Crews, MD  hydrocortisone cream 1 % Apply to affected area 2 times daily 08/17/18 08/17/19  Seawell, Andris Baumann A, DO  insulin aspart (NOVOLOG) 100 UNIT/ML FlexPen INJECT 15 UNITS TO 25 UNITS SUBCUTANEOUSLY 4 TIMES DAILY BEFORE MEALS AND AT BEDTIME 06/02/18   Annia Belt, MD  Insulin Pen Needle 32G X 4 MM MISC Use to inject insulin up to 6 times a day 08/04/17   [name], MD  Insulin Syringe-Needle U-100 31G X 15/64" 0.5 ML MISC Use to inject insulin up to 4 times a day 04/17/16   [name], MD  ipratropium-albuterol (DUONEB) 0.5-2.5 (3) MG/3ML SOLN INHALE THE CONTENTS OF 1 VIAL VIA NEBULIZER EVERY 6 HOURS AS NEEDED FOR SHORTNESS OF BREATH 03/31/18   [name], MD  Lancets Misc. (ACCU-CHEK FASTCLIX LANCET) KIT Check your blood 4 times a day dx code 250.00 insulin requiring 01/19/13   [name], MD  liraglutide (VICTOZA) 18 MG/3ML SOPN Inject 0.3 mLs (1.8 mg total) into the skin daily. 01/15/18   [name], MD  metoprolol succinate (TOPROL-XL) 50 MG 24 hr tablet Take 1 tablet (50 mg total) by mouth daily. 12/18/17   [name], MD  nicotine (NICODERM CQ - DOSED IN MG/24  HOURS) 14 mg/24hr patch Place 1 patch (14 mg total) onto the skin daily. 11/19/17   Dorrell, Andree Elk, MD  nystatin cream (MYCOSTATIN) Apply 1 application topically 2 (two) times daily as needed (rash).  12/31/16   [provider]  omeprazole (PRILOSEC) 40 MG capsule Take 1 capsule (40 mg total) by mouth 2 (two) times daily. 05/28/18   Sid Falcon, MD  ondansetron (ZOFRAN) 4 MG tablet TAKE 1 TABLET BY MOUTH EVERY 8 HOURS AS NEEDED FOR NAUSEA OR VOMITING 09/15/18   Bartholomew Crews, MD  OXYGEN Inhale 2 L into the lungs as needed (for shortness of breath while active).     [provider]  potassium chloride SA (K-DUR) 20 MEQ tablet Take 1 tablet (20 mEq total)  by mouth 2 (two) times daily. 07/22/18   Bartholomew Crews, MD  PRESCRIPTION MEDICATION Inhale into the lungs at bedtime. CPAP    [provider]  rosuvastatin (CRESTOR) 20 MG tablet Take 1 tablet (20 mg total) by mouth at bedtime. 06/24/18   Bartholomew Crews, MD  Tiotropium Bromide Monohydrate (SPIRIVA RESPIMAT) 2.5 MCG/ACT AERS Inhale 2 puffs into the lungs daily. 07/16/18   Bartholomew Crews, MD  warfarin (COUMADIN) 2.5 MG tablet 2.66m warfarin every day except on Mondays - take NO TABLETS and on Tuesdays take ONLY 1/2 pill 10/19/18   BBartholomew Crews MD                                                                                                                                    Past Surgical History Past Surgical History:  Procedure Laterality Date  . CARDIAC CATHETERIZATION    . CARDIAC VALVE REPLACEMENT  Aug. 2012   "mitral valve"  . CAROTID ENDARTERECTOMY Right 07/04/2010   by Dr. BTrula Sladefor asymptomatic right carotid artery stenosis  . CATARACT EXTRACTION W/ INTRAOCULAR LENS  IMPLANT, BILATERAL Bilateral   . CHEST TUBE INSERTION  09/24/2010   Dr VPrescott Gum . COLONOSCOPY  05/12/2011   performed by Dr. [name] Showing small internal hemorrhoids, single tubular adenoma polyp  .  CRYOABLATION Left 09/2011   by Dr. YKathlene Cote [facility] .    .Marland KitchenDILATION AND CURETTAGE OF UTERUS    . ESOPHAGOGASTRODUODENOSCOPY  05/12/2011   performed by Dr. [name] Negative for ulcerations, biopsy negative for evidence of celiac sprue  . ESOPHAGOGASTRODUODENOSCOPY N/A 06/29/2015   Procedure: ESOPHAGOGASTRODUODENOSCOPY (EGD);  Surgeon: MClarene Essex MD;  [facility]  Service: Endoscopy;  Laterality: N/A;  . ESOPHAGOGASTRODUODENOSCOPY N/A 03/29/2016   Procedure: ESOPHAGOGASTRODUODENOSCOPY (EGD);  Surgeon: MClarene Essex MD;  [facility];  Service: Endoscopy;  Laterality: N/A;  . FRACTURE SURGERY    . GIVENS CAPSULE STUDY N/A 06/30/2015   Procedure: GIVENS CAPSULE STUDY;  Surgeon: MClarene Essex MD;  [facility]  Service: Endoscopy;  Laterality: N/A;  . GIVENS CAPSULE STUDY N/A 06/29/2015   Procedure: GIVENS CAPSULE STUDY;  Surgeon: MClarene Essex MD;  [place] [place]  Service: Endoscopy;  Laterality: N/A;  . HEMORRHOID SURGERY  1970s?   "lanced"  . HYSTEROSCOPY W/ ENDOMETRIAL ABLATION  06/2001   for persistent post-menopausal bleeding // by S. DOlena Mater M.D.  . IR GENERIC HISTORICAL  08/23/2015   IR RADIOLOGIST EVAL & MGMT 08/23/2015 GAletta Edouard MD [facility] INTERV RAD  . IR GENERIC HISTORICAL  04/09/2016   IR RADIOLOGIST EVAL & MGMT 04/09/2016 GAletta Edouard MD [facility] INTERV RAD  . IR RADIOLOGIST EVAL & MGMT  10/07/2016  . IR RADIOLOGIST EVAL & MGMT  06/25/2017  . LEFT HEART CATH AND CORONARY ANGIOGRAPHY N/A 04/21/2016   Procedure: Left Heart Cath and Coronary Angiography;  Surgeon: JPearletha Forge  Gwenlyn Found, MD;  Location: [facility] CV LAB;  Service: Cardiovascular;  Laterality: N/A;  . LIPOMA EXCISION  08/2005   occipital lipoma 1.5cm - by Dr. [name]  . LITHOTRIPSY  ~ 2000  . MAZE Left 09/20/10   for paroxysmal atrial fibrillation (Dr. Prescott Gum)  . MITRAL VALVE REPLACEMENT  09/20/10    with a 27-mm pericardial porcine valve  (Medtronic Mosaic valve, serial #[id number]). 09/20/10, Dr Prescott Gum  . ORIF CLAVICLE FRACTURE Right 01/2004   by Thana Farr. [name], M.D for Right clavicle nonunion.; "it's got a pin in it"  . REFRACTIVE SURGERY Bilateral   . RIGHT HEART CATH N/A 04/23/2016   Procedure: Right Heart Cath;  Surgeon: [name], MD;  Location: [facility] CV LAB;  Service: Cardiovascular;  Laterality: N/A;  . TONSILLECTOMY    . TUBAL LIGATION     Family History Family History  Problem Relation Age of Onset  . Peptic Ulcer Disease Father   . Heart attack Father 95       Died of MI at age 22  . Heart attack Brother 50       Died of MI at age 75  . Obesity Brother   . Pneumonia Mother   . Healthy Sister   . Lupus Daughter   . Obsessive Compulsive Disorder Daughter     Social History Social History   Tobacco Use  . Smoking status: Current Every Day Smoker    Packs/day: 1.00    Years: 55.00    Pack years: 55.00    Types: Cigarettes    Start date: 11/01/1962  . Smokeless tobacco: Never Used  Substance Use Topics  . Alcohol use: Not Currently    Comment: 02/19/2018 "nothing since 1999"  . Drug use: Not Currently    Types: Cocaine, Marijuana    Comment: 02/19/2018 "nothing since the 1990s"   Allergies Lorazepam, Morphine and related, Oxycontin [oxycodone], and Tramadol hcl  Review of Systems Review of Systems  Constitutional: Negative for fever.  HENT: Positive for nosebleeds and sinus pain.   Neurological: Negative for headaches.   All other systems are reviewed and are negative for acute change except as noted in the HPI  Physical Exam Vital Signs  I have reviewed the triage vital signs BP 135/72   Pulse 66   Temp 98.4 F (36.9 C) (Oral)   Resp 16   Ht _0  (1.575 m)   Wt 81.6 kg   SpO2 95%   BMI 32.92 kg/m   Physical Exam Vitals signs reviewed.  Constitutional:      General: She is not in acute distress.    Appearance: She is well-developed. She is obese. She is not  diaphoretic.  HENT:     Head: Normocephalic and atraumatic.     Right Ear: External ear normal.     Left Ear: External ear normal.     Nose: Nose normal.     Right Nostril: No epistaxis.     Left Nostril: No epistaxis.  Eyes:     General: No scleral icterus.    Conjunctiva/sclera: Conjunctivae normal.  Neck:     Musculoskeletal: Normal range of motion.     Trachea: Phonation normal.  Cardiovascular:     Rate and Rhythm: Normal rate and regular rhythm.  Pulmonary:     Effort: Pulmonary effort is normal. No respiratory distress.     Breath sounds: No stridor.  Abdominal:     General: There is no distension.  Musculoskeletal: Normal  range of motion.  Neurological:     Mental Status: She is alert and oriented to person, place, and time.  Psychiatric:        Behavior: Behavior normal.     ED Results and Treatments Labs (all labs ordered are listed, but only abnormal results are displayed) Labs Reviewed  PROTIME-INR - Abnormal; Notable for the following components:      Result Value   Prothrombin Time 17.8 (*)    INR 1.5 (*)    All other components within normal limits  I-STAT CHEM 8, ED - Abnormal; Notable for the following components:   Glucose, Bld 123 (*)    Hemoglobin 11.2 (*)    HCT 33.0 (*)    All other components within normal limits                                                                                                                         EKG  EKG Interpretation  Date/Time:    Ventricular Rate:    PR Interval:    QRS Duration:   QT Interval:    QTC Calculation:   R Axis:     Text Interpretation:        Radiology No results found.  Pertinent labs & imaging results that were available during my care of the patient were reviewed by me and considered in my medical decision making (see chart for details).  Medications Ordered in ED Medications  acetaminophen (TYLENOL) tablet 1,000 mg (has no administration in time range)                                                                                                                                     Procedures Procedures  (including critical care time)  Medical Decision Making / ED Course I have reviewed the nursing notes for this encounter and the patient's prior records (if available in EHR or on provided paperwork).   [name] was evaluated in Emergency Department on 10/22/2018 for the symptoms described in the history of present illness. She was evaluated in the context of the global COVID-19 pandemic, which necessitated consideration that the patient might be at risk for infection with the SARS-CoV-2 virus that causes COVID-19. Institutional protocols and algorithms that pertain to the evaluation of patients at risk for COVID-19 are in a state of rapid change based on information released by regulatory bodies including  the [employer] and federal and state organizations. These policies and algorithms were followed during the patient's care in the ED.  Resolved epistaxis. INR subtherapeutic. Hb stable.  The patient appears reasonably screened and/or stabilized for discharge and I doubt any other medical condition or other [facility] requiring further screening, evaluation, or treatment in the ED at this time prior to discharge.  The patient is safe for discharge with strict return precautions.       Final Clinical Impression(s) / ED Diagnoses Final diagnoses:  Left-sided epistaxis    The patient appears reasonably screened and/or stabilized for discharge and I doubt any other medical condition or other [facility] requiring further screening, evaluation, or treatment in the ED at this time prior to discharge.  Disposition: Discharge  Condition: Good  I have discussed the results, Dx and Tx plan with the patient who expressed understanding and agree(s) with the plan. Discharge instructions discussed at great length. The patient was given strict return precautions who verbalized  understanding of the instructions. No further questions at time of discharge.    ED Discharge Orders    None       Follow Up: Bartholomew Crews, MD [place] [id number] [phone]  Schedule an appointment as soon as possible for a visit        This chart was dictated using voice recognition software.  Despite best efforts to proofread,  errors can occur which can change the documentation meaning.   Fatima Blank, MD 10/22/18 [phone]

## 2018-10-25 ENCOUNTER — Telehealth: Payer: Self-pay | Admitting: Pharmacist

## 2018-10-25 NOTE — Telephone Encounter (Signed)
Pt calling requesting to speak with Bonnita Nasuti.

## 2018-10-25 NOTE — Telephone Encounter (Addendum)
Spoke to patient about sweating as a possible diabetes complication. She says she has had diabetes "a long time". Up to date information was complicated so mailed her an article from healthline that talked about types of sweating and their possible causes.  Mailed article about sweating due to diabetes and encouraged Tanica to talk to Dr. Lynnae January about it. She varbalized understanding.

## 2018-10-25 NOTE — Telephone Encounter (Signed)
Patient texted me results of a PST FS POC INR = 3.0 (goal 1.5 - 2.5). Patient states she had an episode of epistaxis on Thursday PM for which she went to the ED. INR by venipuncture, laboratory processed was 1.5 on 13.19m warfarin/wk. There have been no extra-doses, no new medications. Patient OMITS dosing on every Monday (today). Her dose tomorrow (as per plan) is only 1.287m(1/2 x 2.26m32marfarin). She agrees to permit the omitted dose today and the reduced dose tomorrow become the determiner next Monday as to whether we should on a weekly basis decrease her total weekly dose from 13.726m38mrfarin/wk to 12.50mg77mfarin/wk. She was advised if there were to be any subsequent episodes of epistaxis (or bleeding anywhere else, which she denies at this time)--to call be BEFORE next Monday's planned repeat PST FS POC INR determination.

## 2018-10-26 ENCOUNTER — Other Ambulatory Visit: Payer: Self-pay | Admitting: Internal Medicine

## 2018-10-26 DIAGNOSIS — Z1231 Encounter for screening mammogram for malignant neoplasm of breast: Secondary | ICD-10-CM

## 2018-10-26 NOTE — Telephone Encounter (Signed)
rtc to pt, reassured, offered appt multiple times , refused multiple times

## 2018-11-01 ENCOUNTER — Telehealth: Payer: Self-pay | Admitting: Pharmacist

## 2018-11-01 NOTE — Telephone Encounter (Signed)
Patient texts me result of PST FS POC INR 3.2 on 13.48m warfarin/wk. Will DECREASE to 12.561mwarfarin/wk--as: 0 tab on Mon 1/2 of 2.34m29mn Tu/Fri; 1x34mg41mb on Wed, Thu, Sat, Sun. Repeat INR in 7-14 days. Has appointment with Cardiologist on 11/05/2018. Patient states she did fall after loss of balance in her wheel-chair while bending over to retrieve smoking materials last Wednesday 27-Oct-2018. States EMS came to home and gave her an option of going to the ED or staying at home. She elected to stay at home. Patient states she had then "a knot" on her forehead. She states now she has a "black-eye". Denies any nausea, vomiting, headache, no visual changes or disturbances, no lateralizing signs or symptoms. She states she is seeing her cardiologist on Friday 05-Nov-2018. She was advised if any of the symptoms/signs we discussed in detail that could suggest increased intracranial pressure, she is to report to the ED.

## 2018-11-02 ENCOUNTER — Other Ambulatory Visit: Payer: Self-pay | Admitting: *Deleted

## 2018-11-02 NOTE — Patient Outreach (Signed)
  [facility]) Care Management Chronic Special Needs Program    11/02/2018  Name: [name], DOB: May 19, 1950  MRN: [id number]   [name] is enrolled in a chronic special needs plan for Diabetes.  Attempted to reach Mrs. Stampley via contact number to complete initial assessment; no answer; left HIPAA compliant message requesting return call.  Plan: If client does not return call, will make second outreach call within one week.  Kelli Churn RN, CCM, [facility] Care Management [phone]

## 2018-11-03 ENCOUNTER — Telehealth: Payer: Self-pay | Admitting: *Deleted

## 2018-11-03 ENCOUNTER — Other Ambulatory Visit: Payer: Self-pay | Admitting: *Deleted

## 2018-11-03 NOTE — Telephone Encounter (Signed)
[facility] through pt's insurance calls and states they did home visit  She suggests effexor  telemed therapist from [facility], this may be a possiblity tawajuana lordeus at [phone], she would like a call from you to discuss pt's medical problems, depression and medications

## 2018-11-03 NOTE — Patient Outreach (Signed)
  [facility]) Care Management Chronic Special Needs Program    11/03/2018  Name: [name], DOB: 11-17-1950  MRN: [id number]   [name] is enrolled in a chronic special needs plan for Diabetes and Heart Failure.  [name] returned call to this RNCM on 11/02/18 at 5:25 pm and left message stating she was returning this RNCM's call.  Attempted to return call to client via contact number to complete initial assessment; no answer; left HIPAA compliant message requesting return call and requesting she leave a convenient date and time that she can be successfully contacted is she reaches this RNCM's confidential voice mail.  Plan: If client does not return call, will make a third outreach call within one week.  Kelli Churn RN, CCM, [facility] [phone]

## 2018-11-03 NOTE — Telephone Encounter (Signed)
Will do tomorrow am

## 2018-11-05 ENCOUNTER — Other Ambulatory Visit: Payer: Self-pay | Admitting: *Deleted

## 2018-11-05 ENCOUNTER — Ambulatory Visit (HOSPITAL_COMMUNITY)
Admission: RE | Admit: 2018-11-05 | Discharge: 2018-11-05 | Disposition: A | Discharge status: Home / Self Care | Payer: HMO | Source: Ambulatory Visit | Attending: Cardiology | Admitting: Cardiology

## 2018-11-05 ENCOUNTER — Encounter (HOSPITAL_COMMUNITY): Payer: Self-pay | Admitting: Cardiology

## 2018-11-05 ENCOUNTER — Other Ambulatory Visit: Payer: Self-pay

## 2018-11-05 VITALS — BP 124/78 | HR 71 | Wt 184.4 lb

## 2018-11-05 DIAGNOSIS — Z953 Presence of xenogenic heart valve: Secondary | ICD-10-CM | POA: Insufficient documentation

## 2018-11-05 DIAGNOSIS — F17200 Nicotine dependence, unspecified, uncomplicated: Secondary | ICD-10-CM | POA: Insufficient documentation

## 2018-11-05 DIAGNOSIS — E785 Hyperlipidemia, unspecified: Secondary | ICD-10-CM | POA: Diagnosis not present

## 2018-11-05 DIAGNOSIS — I2510 Atherosclerotic heart disease of native coronary artery without angina pectoris: Secondary | ICD-10-CM | POA: Diagnosis not present

## 2018-11-05 DIAGNOSIS — Z794 Long term (current) use of insulin: Secondary | ICD-10-CM | POA: Diagnosis not present

## 2018-11-05 DIAGNOSIS — Z8249 Family history of ischemic heart disease and other diseases of the circulatory system: Secondary | ICD-10-CM | POA: Diagnosis not present

## 2018-11-05 DIAGNOSIS — I480 Paroxysmal atrial fibrillation: Secondary | ICD-10-CM | POA: Diagnosis not present

## 2018-11-05 DIAGNOSIS — J449 Chronic obstructive pulmonary disease, unspecified: Secondary | ICD-10-CM | POA: Diagnosis not present

## 2018-11-05 DIAGNOSIS — I272 Pulmonary hypertension, unspecified: Secondary | ICD-10-CM | POA: Insufficient documentation

## 2018-11-05 DIAGNOSIS — I5032 Chronic diastolic (congestive) heart failure: Secondary | ICD-10-CM | POA: Diagnosis not present

## 2018-11-05 DIAGNOSIS — Z7901 Long term (current) use of anticoagulants: Secondary | ICD-10-CM | POA: Insufficient documentation

## 2018-11-05 DIAGNOSIS — G4733 Obstructive sleep apnea (adult) (pediatric): Secondary | ICD-10-CM | POA: Diagnosis not present

## 2018-11-05 DIAGNOSIS — E119 Type 2 diabetes mellitus without complications: Secondary | ICD-10-CM | POA: Insufficient documentation

## 2018-11-05 DIAGNOSIS — Z79899 Other long term (current) drug therapy: Secondary | ICD-10-CM | POA: Diagnosis not present

## 2018-11-05 LAB — BASIC METABOLIC PANEL
Anion gap: 10 (ref 5–15)
BUN: 12 mg/dL (ref 8–23)
CO2: 25 mmol/L (ref 22–32)
Calcium: 8.8 mg/dL — ABNORMAL LOW (ref 8.9–10.3)
Chloride: 102 mmol/L (ref 98–111)
Creatinine, Ser: 0.98 mg/dL (ref 0.44–1.00)
GFR calc Af Amer: >60 mL/min (ref >60)
GFR calc non Af Amer: 59 mL/min — ABNORMAL LOW (ref >60)
Glucose, Bld: 147 mg/dL — ABNORMAL HIGH (ref 70–99)
Potassium: 3.8 mmol/L (ref 3.5–5.1)
Sodium: 137 mmol/L (ref 135–145)

## 2018-11-05 LAB — CBC
HCT: 31.8 % — ABNORMAL LOW (ref 36.0–46.0)
Hemoglobin: 9.4 g/dL — ABNORMAL LOW (ref 12.0–15.0)
MCH: 24 pg — ABNORMAL LOW (ref 26.0–34.0)
MCHC: 29.6 g/dL — ABNORMAL LOW (ref 30.0–36.0)
MCV: 81.3 fL (ref 80.0–100.0)
Platelets: 204 10*3/uL (ref 150–400)
RBC: 3.91 MIL/uL (ref 3.87–5.11)
RDW: 17.2 % — ABNORMAL HIGH (ref 11.5–15.5)
WBC: 8.6 10*3/uL (ref 4.0–10.5)
nRBC: 0 % (ref 0.0–0.2)

## 2018-11-05 LAB — LIPID PANEL
Cholesterol: 115 mg/dL (ref 0–200)
HDL: 38 mg/dL — ABNORMAL LOW (ref >40)
LDL Cholesterol: 48 mg/dL (ref 0–99)
Total CHOL/HDL Ratio: 3 RATIO
Triglycerides: 146 mg/dL (ref <150)
VLDL: 29 mg/dL (ref 0–40)

## 2018-11-05 LAB — BRAIN NATRIURETIC PEPTIDE: B Natriuretic Peptide: 38.2 pg/mL (ref 0.0–100.0)

## 2018-11-05 MED ORDER — POTASSIUM CHLORIDE CRYS ER 20 MEQ PO TBCR: EXTENDED_RELEASE_TABLET | ORAL | 2 refills | Status: DC

## 2018-11-05 MED ORDER — FUROSEMIDE 80 MG PO TABS: ORAL_TABLET | ORAL | 2 refills | Status: DC

## 2018-11-05 NOTE — Patient Instructions (Addendum)
Labs done today. We will contact you only if your labs are abnormal.  INCREASE lasix to 66m(1 tab) in the morning and 454m1/2 tab) in the evening.  INCREASE Potassium to 4048m tabs) in the morning and 55m74mtab) in the evening.  Your physician recommends that you schedule a follow-up appointment in:10 days for a lab appointment and in 1 month with PA/NP Clinic in 1 month.   At the [facility] and your health needs are our priority. As part of our continuing mission to provide you with exceptional heart care, we have created designated Provider Care Teams. These Care Teams include your primary Cardiologist (physician) and Advanced Practice Providers (APPs- Physician Assistants and Nurse Practitioners) who all work together to provide you with the care you need, when you need it.   You may see any of the following providers on your designated Care Team at your next follow up: . DrMarland KitchenDaniGlori Bickersr DaltLoralie Champagnemy Darrick Grinder   Please be sure to bring in all your medications bottles to every appointment.

## 2018-11-05 NOTE — Progress Notes (Unsigned)
ReDS Vest - 11/05/18 0900      ReDS Vest   MR   Moderate    Estimated volume prior to reading  Low    Fitting Posture  Sitting    Height Marker  Short    Ruler Value  45    Center Strip  Aligned    ReDS Value  40    Anatomical Comments  station b

## 2018-11-05 NOTE — Telephone Encounter (Signed)
tawajuana would like for you to call, thanks

## 2018-11-05 NOTE — Patient Outreach (Signed)
  [facility]) Care Management Chronic Special Needs Program    11/05/2018  Name: [name], DOB: 1950-07-10  MRN: [id number]   [name] is enrolled in a chronic special needs plan for Diabetes and Heart Failure.  Third attempt to reach Ms. Tuzzolino via contact (mobile) number to complete initial assessment; no answer; left HIPAA compliant message requesting return call.  Plan: If client does not return call, will compose individualized care plan based on available electronic medical record data and send care plan to client and her primary care provider.  Kelli Churn RN, CCM, [facility] [phone]

## 2018-11-07 NOTE — Progress Notes (Signed)
PCP: Dr. Eppie Gibson HF Cardiology: Dr. Aundra Dubin  68 y.o. with COPD, mitral stenosis s/p bioprosthetic MV replacement, paroxysmal atrial fibrillation, GI bleeding, and pulmonary hypertension/RV failure presents for cardiology followup.  She was admitted with GI bleeding, thought to be AVMs, in 2/18 and warfarin was stopped.  She was admitted in 3/18 with chest pain, dyspnea, and elevated troponin.  She had not been taking her Lasix regularly.  Coronary angiography showed nonobstructive CAD, suspect demand ischemia. RHC showed elevated right and left heart filling pressures and severe pulmonary hypertension.  V/Q scan showed no chronic PE, and PFTs showed severe obstructive lung disease.  Echo showed severe RV dilation with mildly decreased systolic function.  She was diuresed and eventually discharged to [facility].   She was admitted in 2/19 with anemia, got 2 units PRBCs.    Echo in 5/19 with EF 60-65%, mildly decreased RV systolic function, PASP 49 mmHg, normal bioprosthetic mitral valve.   She returns for followup of diastolic CHF.  She has home oxygen.  She uses her CPAP.  Still smoking, failed Chantix in the past.  She remains short of breath if she walks longer distances than around her house.  She has chronic orthopnea, has to sleep on her side on a pillow.  No PND, chest pain, or palpitations.  No BRBPR/melena.  Weight is down compared to last appointment, but that was about a year ago. +Low back pain.   ECG (personally reviewed): NSR, 1st degree AVB, nonspecific T wave flattening  REDS vest reading 40%  Labs (3/18): K 4.8 => 3.9, creatinine 1.2 => 1.09, hgb 10.2 => 10.7, BNP 131 Labs (5/18): K 4, creatinine 1.0 Labs (2/19): K 4.5, creatinine 0.94, transferrin saturation 3%.  Labs (3/19): hgb 11.3 Labs (4/19): K 4.1, creatinine 1.0, LDL 36 Labs (1/10): creatinine 0.85 Labs (7/20): hgb 10.7  PMH: 1. Mitral stenosis: s/p bioprosthetic mitral valve replacement.  2. COPD: Severe by 3/18 PFTs.   She is still smoking.  3. Atrial fibrillation: Paroxysmal. Has been off warfarin since GI bleed in 2/18.  4. OSA: Uses CPAP.  5. Type II diabetes. 6. Pulmonary hypertension/RV failure: Suspect primarily group 3 PH due to underlying lung disease (COPD).  - RHC (3/18) with mean RA 14, PA 77/28 mean 47, mean PCWP 23, CI 2.6, PVR 4.9.  - Echo (3/18): EF 60-65%, severe RV dilation with mildly decreased RV systolic function, PASP 76 mmHg.  Bioprosthetic MV looks ok.  - V/Q scan (3/18): No evidence for chronic PE.  - PFTs (3/18): Severe obstructive airways disease.  - Echo in 5/19 with EF 60-65%, mildly decreased RV systolic function, PASP 49 mmHg, normal bioprosthetic mitral valve.  7. Anemia: Fe deficiency and anemia of chronic disease.   - Suspect GI AVMs as cause of 2/18 bleed . Coumadin stopped.  8. CAD: LHC (3/18) with 30% LM, 40% proximal RCA.  9. Hyperlipidemia  SH: Lives alone in [place].  Active smoker.  No ETOH.   Family History  Problem Relation Age of Onset  . Peptic Ulcer Disease Father   . Heart attack Father 55       Died of MI at age 21  . Heart attack Brother 11       Died of MI at age 53  . Obesity Brother   . Pneumonia Mother   . Healthy Sister   . Lupus Daughter   . Obsessive Compulsive Disorder Daughter    ROS: All systems reviewed and negative except as per HPI.  Current Outpatient Medications  Medication Sig Dispense Refill  . albuterol (PROAIR HFA) 108 (90 Base) MCG/ACT inhaler Inhale 2 puffs into the lungs every 6 (six) hours as needed for shortness of breath. 18 g 5  . ALPRAZolam (XANAX) 1 MG tablet Take 1 tablet (1 mg) by mouth at bedtime as needed for insomnia.  May take 1/2 tablet (0.5 mg) by mouth during the day for anxiety. 55 tablet 5  . benzonatate (TESSALON) 100 MG capsule Take 1 capsule (100 mg total) by mouth 3 (three) times daily as needed for cough. 90 capsule 11  . Blood Glucose Monitoring Suppl (ONETOUCH VERIO) W/DEVICE KIT 1 each by Does  not apply route 4 (four) times daily. 1 kit 0  . buPROPion (WELLBUTRIN) 75 MG tablet Take 1 tablet (75 mg total) by mouth 2 (two) times daily. 180 tablet 3  . chlorpheniramine (CHLOR-TRIMETON) 4 MG tablet Take 4 mg by mouth daily as needed for allergies.    . clobetasol cream (TEMOVATE) 1.61 % Apply 1 application topically 2 (two) times daily as needed (lichen sclerosus flare). 45 g 3  . DULoxetine (CYMBALTA) 60 MG capsule Take 1 capsule (60 mg total) by mouth 2 (two) times daily. 180 capsule 3  . ferrous gluconate (FERGON) 324 MG tablet Take 1 tablet (324 mg total) by mouth 2 (two) times daily with a meal. (Patient taking differently: Take 648 mg by mouth See admin instructions. Take 2 tablets (648 mg) by mouth daily with lunch or supper) 180 tablet 3  . fluticasone (FLONASE) 50 MCG/ACT nasal spray Place 2 sprays into both nostrils daily. 16 g 11  . Fluticasone-Salmeterol (ADVAIR DISKUS) 500-50 MCG/DOSE AEPB Inhale 1 puff into the lungs 2 (two) times daily. 180 each 3  . furosemide (LASIX) 80 MG tablet Take 1 tablet by mouth every morning and a 1/2 tablet every evening 45 tablet 2  . gabapentin (NEURONTIN) 300 MG capsule Take 2 capsules (600 mg total) by mouth 3 (three) times daily. 540 capsule 3  . glucose blood (ONETOUCH VERIO) test strip Use to check blood sugar 4 times daily. diag code E11.40. Insulin dependent 375 each 1  . HYDROcodone-acetaminophen (NORCO/VICODIN) 5-325 MG tablet Take 1-2 tablets by mouth every 6 (six) hours as needed for severe pain. 115 tablet 0  . hydrocortisone cream 1 % Apply to affected area 2 times daily 14 g 0  . insulin aspart (NOVOLOG) 100 UNIT/ML FlexPen INJECT 15 UNITS TO 25 UNITS SUBCUTANEOUSLY 4 TIMES DAILY BEFORE MEALS AND AT BEDTIME 15 mL 4  . Insulin Pen Needle 32G X 4 MM MISC Use to inject insulin up to 6 times a day 500 each 3  . Insulin Syringe-Needle U-100 31G X 15/64" 0.5 ML MISC Use to inject insulin up to 4 times a day 400 each 3  .  ipratropium-albuterol (DUONEB) 0.5-2.5 (3) MG/3ML SOLN INHALE THE CONTENTS OF 1 VIAL VIA NEBULIZER EVERY 6 HOURS AS NEEDED FOR SHORTNESS OF BREATH 360 mL 3  . Lancets Misc. (ACCU-CHEK FASTCLIX LANCET) KIT Check your blood 4 times a day dx code 250.00 insulin requiring 1 kit 2  . liraglutide (VICTOZA) 18 MG/3ML SOPN Inject 0.3 mLs (1.8 mg total) into the skin daily. 3 pen 11  . metoprolol succinate (TOPROL-XL) 50 MG 24 hr tablet Take 1 tablet (50 mg total) by mouth daily. 90 tablet 3  . nicotine (NICODERM CQ - DOSED IN MG/24 HOURS) 14 mg/24hr patch Place 1 patch (14 mg total) onto the skin daily. 28 patch  0  . nystatin cream (MYCOSTATIN) Apply 1 application topically 2 (two) times daily as needed (rash).   3  . omeprazole (PRILOSEC) 40 MG capsule Take 1 capsule (40 mg total) by mouth 2 (two) times daily. 180 capsule 3  . ondansetron (ZOFRAN) 4 MG tablet TAKE 1 TABLET BY MOUTH EVERY 8 HOURS AS NEEDED FOR NAUSEA OR VOMITING 20 tablet 2  . OXYGEN Inhale 2 L into the lungs as needed (for shortness of breath while active).     . potassium chloride SA (K-DUR) 20 MEQ tablet Take 2 tablets by mouth every morning and 1 tablet by mouth every evening 90 tablet 2  . PRESCRIPTION MEDICATION Inhale into the lungs at bedtime. CPAP    . rosuvastatin (CRESTOR) 20 MG tablet Take 1 tablet (20 mg total) by mouth at bedtime. 90 tablet 3  . Tiotropium Bromide Monohydrate (SPIRIVA RESPIMAT) 2.5 MCG/ACT AERS Inhale 2 puffs into the lungs daily. 1 Inhaler 11  . warfarin (COUMADIN) 2.5 MG tablet 2.43m warfarin every day except on Mondays - take NO TABLETS and on Tuesdays take ONLY 1/2 pill 90 tablet 1   Current Facility-Administered Medications  Medication Dose Route Frequency Provider Last Rate Last Dose  . senna-docusate (Senokot-S) tablet 1 tablet  1 tablet Oral QHS PRN Hoffman, Jessica Ratliff, DO       BP 124/78   Pulse 71   Wt 83.6 kg (184 lb 6.4 oz)   SpO2 92%   BMI 33.73 kg/m  General: NAD Neck: Thick, JVP  difficult, no thyromegaly or thyroid nodule.  Lungs: Distant breath sounds, occasional rhonchi.  CV: Nondisplaced PMI.  Heart regular S1/S2, no S3/S4, no murmur.  1+ edema 1/2 up lower legs.  No carotid bruit.  Normal pedal pulses.  Abdomen: Soft, nontender, no hepatosplenomegaly, no distention.  Skin: Intact without lesions or rashes.  Neurologic: Alert and oriented x 3.  Psych: Normal affect. Extremities: No clubbing or cyanosis.  HEENT: Normal.   Assessment/Plan: 1. Pulmonary hypertension: I suspect that this is primarily group 2 PH (pulmonary venous hypertension from LV diastolic dysfunction) and group 3 PH (severe COPD, OSA).  V/Q scan showed no evidence for chronic PE.  I do not think that she will benefit from selective pulmonary vasodilators.  I think that the key here will continue to be good diuresis + use of oxygen and CPAP.  2. Chronic diastolic CHF with prominent RV dysfunction in the setting of severe COPD.  Volume status is difficult by exam but she looks like she is volume overloaded, REDS vest reading of 40% is also suggestive.  NYHA class III symptoms (dyspnea is likely a combination of CHF and COPD) => dyspnea is chronic.  - Increase Lasix to 80 qam/40 qpm and increase KCl to 40 qam/20 qpm.  BMET today and in 10 days.    3. Atrial fibrillation: Paroxysmal.  She is in NSR today.  She had been off anticoagulation due to GI bleeding, now back on warfarin with INR goal 1.5-2.5.  She had GI bleeding in 2/19, no recent melena.   - Keep warfarin with INR 1.5-2.5 for now.  - CBC today . - With most recent data regarding NOACs and bioprosthetic valves, I sugggested in the past that she switch to apixaban given GI bleeding history. She has wanted to stay on warfarin.  4. h/o GI bleeding: Possible GI AVM. Admitted with anemia again in 2/19, got 2 units PRBCs.  No overt bleeding now.  - CBC today.  5.  COPD: Severe by PFTs. Unfortunately, she is still smoking.  I encouraged her to quit  again.  6. CAD: Nonobstructive. She is on a statin.  - Check lipids today.   Followup with [name] in 1 month.   Loralie Champagne 11/07/2018

## 2018-11-08 ENCOUNTER — Telehealth: Payer: Self-pay | Admitting: Pharmacist

## 2018-11-08 NOTE — Telephone Encounter (Signed)
I returned phone call.  The suggestion was to switch her Cymbalta to Effexor as they are both SNRIs..  I will discuss this with the patient at her November appointment.  It was reported that she was unable to increase her bupropion due to side effects.  The other question was switching her from short acting opioids to long-acting opioids since she is not taking the short acting as prescribed and is having pain at night..  I did a chart review and she had been on [place] Contin but in 2014, it was stopped after she had a fall that she attributed to the [place] Contin and also did not like the drug feeling it gave her.  I am hesitant to do this due to increased risk with long-acting opioids.  Her appointment with me is in early November.  I do not have any openings sooner.

## 2018-11-08 NOTE — Telephone Encounter (Signed)
Patient texted me her PST POC FS INR = 2.2 (goal 1.5 - 2.5) on 12.38m total warfarin/wk. Denies any signs or symptoms of bleeding. Patient was advised to continue same regimen. Patient insists on not taking any warfarin on Mondays. She takes 1/2 of her 2.563mgreen-warfarin tablets on Tuesdays and Fridays; 1 x 2.68m67mn Wednesdays,Thursdays, Saturdays and Sundays. She performs PST, FS, POC INRs weekly based upon agreement with the device provider. Patient states she has an adequate supply of warfarin on-hand.

## 2018-11-09 ENCOUNTER — Other Ambulatory Visit: Payer: Self-pay | Admitting: *Deleted

## 2018-11-09 ENCOUNTER — Telehealth: Payer: Self-pay | Admitting: Dietician

## 2018-11-09 ENCOUNTER — Encounter: Payer: Self-pay | Admitting: *Deleted

## 2018-11-09 NOTE — Telephone Encounter (Addendum)
[name] called asking for professional CGM when she comes in November. I suggested that appointment be either her second CGM information or last so that Dr. Lynnae January and she will have the information to discuss and make adjustments. She said that was fine and Dr. Lynnae January could make that decision and she'd get a transportation as needed.

## 2018-11-09 NOTE — Patient Outreach (Addendum)
[facility]) Care Management Chronic Special Needs Program  11/09/2018  Name: SABLE KNOLES DOB: 1950-05-23  MRN: [id number]  Ms. Kateline Kinkade is enrolled in a chronic special needs plan for Diabetes and Heart Failure. Chronic Care Management Coordinator telephoned client to review health risk assessment and to develop individualized care plan.  Introduced the chronic care management program, importance of client participation, and taking their care plan to all provider appointments and inpatient facilities.  Reviewed the transition of care process and possible referral to community care management.  Subjective: Mrs. Trevor states she is not doing well today, chronic back pain worse today, says she thinks she may be a little depressed but she sees a counselor on a regular basis and is adherent with her antidepressant.  Says she has suffered from mild memory loss for many years but it doesn't bother her and it doesn't affect her quality of life. Says she is receptive to information on how to improve her memory. Regarding her heart failure, she has not required hospitalization in many years, she states she takes her medications as prescribed, is adherent with cardiology appointments and is aware when she is beginning to retain fluid.  Regarding her diabetes, states she checks her blood sugar 3-4 times daily and that the values are consistently between 125-180. She says she takes her medications as prescribed and sees her primary care provider and certified diabetes educator (CDE)  as directed. She defines her hypoglycemic threshold as <100 and does not report recent hypoglycemia. Says she has worn 2 professional blood glucose monitors in the past but each time the monitoring time was prematurely discontinued due to sensor malfunction and sensor being removed due to need for CT scan.    Goals Addressed            This Visit's Progress     Patient Stated   . Patient Stated-  "improve my back pain" (pt-stated)       Discussed possible chronic back pain treatment options to discuss with primary care provider including steroid injections and spinal stimulator      Other   . Blood Pressure < 130/80       Mailed client Emmi education handout :" Diabetes and Blood Pressure    . Client understands the importance of follow-up with providers by attending scheduled visits   On track    Client is adherent with keeping provider appointments    . Client will report no fall or injuries in the next 3 months.       Mailed client Emmi education handout ; "Preventing Falls in the Older Adult: and Check for Safety brochure    . Client will verbalize knowledge of chronic lung disease as evidenced by no ED visits or Inpatient stays related to chronic lung disease        Mailed client Emmi education handout : COPD: When To Get Help    . Client will verbalize knowledge of self management of Hypertension as evidences by BP reading of 140/90 or less; or as defined by provider       Mailed client Emmi education handout :" Diabetes and Blood Pressure    . Client/Caregiver will verbalize understanding of instructions related to self-care and safety       Mailed client Emmi education handout; " How to Help Your Memory" and Check For Safety brochure    . HEMOGLOBIN A1C < 8.0       Discussed target Hgb A1C reviewed current Hgb  A1C results of 6.5% on 07/15/18, assessed hypoglycemic frequency and threshold (<100) , suggested she speak with her primary care provider's CDE about trial of Freestyle Libre Reviewed diabetes self management actions:  Glucose monitoring per provider recommendations  Eat Healthy- Mailed client Emmi education handout; "Diabetic Meal Planning"  Check feet daily  Visit provider every 3-6 months as directed  Hbg A1C level every 3-6 months.  Eye Exam yearly    . Maintain timely refills of Heart Failure medication as prescribed within the year    On track   .  Obtain annual  Lipid Profile, LDL-C   On track    Lipid profile completed 11/05/18- revealed  all components meeting target except HDL slightly low at  38    . Quit smoking / using tobacco   Not on track   . Visit Primary Care Provider or Cardiologist at least 2 times per year   On track    Assessment: Client is  meeting diabetes self-management goal of hemoglobin A1C of <8% with most recent reading of 6.5%% on 07/15/18 without reports of hypoglycemia . Client has not experienced recent heart failure exacerbations and is adherent with heart failure medications.  Client has good understanding of:  COVID-19 cause, symptoms, precautions (social distancing, stay at home order, hand washing, wear face covering when unable to maintain or ensure 6 foot social distancing), and symptoms requiring provider notification.  Plan:   Send successful outreach letter with a copy of their individualized care plan,  Send individual care plan to provider  Send educational materials as mentioned in care plan   Chronic care management coordination will outreach in:  3 Months   Kelli Churn RN, CCM, [facility] Management Coordinator [facility] [phone]

## 2018-11-10 DIAGNOSIS — H53022 Refractive amblyopia, left eye: Secondary | ICD-10-CM | POA: Diagnosis not present

## 2018-11-10 DIAGNOSIS — E119 Type 2 diabetes mellitus without complications: Secondary | ICD-10-CM | POA: Diagnosis not present

## 2018-11-10 DIAGNOSIS — H43813 Vitreous degeneration, bilateral: Secondary | ICD-10-CM | POA: Diagnosis not present

## 2018-11-10 DIAGNOSIS — H04123 Dry eye syndrome of bilateral lacrimal glands: Secondary | ICD-10-CM | POA: Diagnosis not present

## 2018-11-10 DIAGNOSIS — Z961 Presence of intraocular lens: Secondary | ICD-10-CM | POA: Diagnosis not present

## 2018-11-10 DIAGNOSIS — H05243 Constant exophthalmos, bilateral: Secondary | ICD-10-CM | POA: Diagnosis not present

## 2018-11-10 LAB — HM DIABETES EYE EXAM

## 2018-11-11 ENCOUNTER — Telehealth: Payer: Self-pay | Admitting: Internal Medicine

## 2018-11-11 ENCOUNTER — Other Ambulatory Visit: Payer: Self-pay | Admitting: Internal Medicine

## 2018-11-11 DIAGNOSIS — F419 Anxiety disorder, unspecified: Secondary | ICD-10-CM

## 2018-11-11 NOTE — Telephone Encounter (Signed)
Pt is requesting a callback [phone]

## 2018-11-11 NOTE — Telephone Encounter (Signed)
[name] wants to know if okay for her to use Isagenix shake and supermix. She was also asking about a personal CGM.

## 2018-11-12 ENCOUNTER — Other Ambulatory Visit: Payer: Self-pay | Admitting: Dietician

## 2018-11-12 DIAGNOSIS — E1140 Type 2 diabetes mellitus with diabetic neuropathy, unspecified: Secondary | ICD-10-CM

## 2018-11-12 NOTE — Progress Notes (Unsigned)
Patient requests referral for Continuous glucose monitoring. Requested rferral is in this note.

## 2018-11-12 NOTE — Telephone Encounter (Signed)
She has an appointment for CGM placement for 1 week before your appointment with her.  Butch Penny

## 2018-11-13 NOTE — Telephone Encounter (Signed)
Thank you [name].  I am sorry I am late getting back to you but the appointment you arranged works well for me.

## 2018-11-15 ENCOUNTER — Encounter: Payer: Self-pay | Admitting: *Deleted

## 2018-11-15 ENCOUNTER — Other Ambulatory Visit: Payer: Self-pay

## 2018-11-15 ENCOUNTER — Ambulatory Visit (HOSPITAL_COMMUNITY)
Admission: RE | Admit: 2018-11-15 | Discharge: 2018-11-15 | Disposition: A | Discharge status: Home / Self Care | Payer: HMO | Source: Ambulatory Visit | Attending: Cardiology | Admitting: Cardiology

## 2018-11-15 ENCOUNTER — Ambulatory Visit (INDEPENDENT_AMBULATORY_CARE_PROVIDER_SITE_OTHER): Payer: HMO | Admitting: Internal Medicine

## 2018-11-15 ENCOUNTER — Telehealth: Payer: Self-pay | Admitting: Pharmacist

## 2018-11-15 ENCOUNTER — Telehealth: Payer: Self-pay | Admitting: *Deleted

## 2018-11-15 VITALS — BP 132/49 | HR 76 | Temp 98.4°F | Ht 61.0 in | Wt 182.4 lb

## 2018-11-15 DIAGNOSIS — I5032 Chronic diastolic (congestive) heart failure: Secondary | ICD-10-CM | POA: Diagnosis not present

## 2018-11-15 DIAGNOSIS — M797 Fibromyalgia: Secondary | ICD-10-CM | POA: Diagnosis not present

## 2018-11-15 DIAGNOSIS — J309 Allergic rhinitis, unspecified: Secondary | ICD-10-CM | POA: Diagnosis not present

## 2018-11-15 DIAGNOSIS — Z79899 Other long term (current) drug therapy: Secondary | ICD-10-CM

## 2018-11-15 DIAGNOSIS — Z9981 Dependence on supplemental oxygen: Secondary | ICD-10-CM | POA: Diagnosis not present

## 2018-11-15 DIAGNOSIS — Z23 Encounter for immunization: Secondary | ICD-10-CM | POA: Diagnosis not present

## 2018-11-15 DIAGNOSIS — R040 Epistaxis: Secondary | ICD-10-CM | POA: Diagnosis not present

## 2018-11-15 DIAGNOSIS — J342 Deviated nasal septum: Secondary | ICD-10-CM | POA: Diagnosis not present

## 2018-11-15 DIAGNOSIS — F418 Other specified anxiety disorders: Secondary | ICD-10-CM | POA: Diagnosis not present

## 2018-11-15 DIAGNOSIS — J301 Allergic rhinitis due to pollen: Secondary | ICD-10-CM

## 2018-11-15 LAB — BASIC METABOLIC PANEL
Anion gap: 9 (ref 5–15)
BUN: 12 mg/dL (ref 8–23)
CO2: 27 mmol/L (ref 22–32)
Calcium: 9.2 mg/dL (ref 8.9–10.3)
Chloride: 98 mmol/L (ref 98–111)
Creatinine, Ser: 1.08 mg/dL — ABNORMAL HIGH (ref 0.44–1.00)
GFR calc Af Amer: >60 mL/min (ref >60)
GFR calc non Af Amer: 52 mL/min — ABNORMAL LOW (ref >60)
Glucose, Bld: 245 mg/dL — ABNORMAL HIGH (ref 70–99)
Potassium: 3.8 mmol/L (ref 3.5–5.1)
Sodium: 134 mmol/L — ABNORMAL LOW (ref 135–145)

## 2018-11-15 NOTE — Telephone Encounter (Signed)
Patient texted me her PST FS POC INR determination for today = 2.2 (goal 1.5 - 2.5) on: 59m on Mondays; 2.510mtablet on We/Th/Sa; 1/2 x 2.42m842m1.242m81mse) on Tu/Friday. Continue this regiment and perform PST FS POC INR 22-Nov-2018 and text to me. Denies any symptoms of bleeding. States has adequate supply of warfarin on-hand.

## 2018-11-15 NOTE — Telephone Encounter (Signed)
PCP received faxed enrollment form from [facility]. Spoke with patient who states she has been doing home INR testing for a month now. Spoke with Dr. Elie Confer who stated the enrollment form originally sent by Dr. [name] needed to be signed by PCP. Completed form faxed to [place] at [phone]. Hubbard Hartshorn, BSN, RN-BC

## 2018-11-15 NOTE — Patient Instructions (Addendum)
Thank you for trusting me with your care. Today we talked about the following   Nasal congestion and brown mucus when blowing nose - Your nasal mucosa is likely irritated from oxygen use and change in weather - Use your saline spray  - Continue Flonase as needed - Chlorpheniramine is a first generation antihistamine and I recommend only using this at bedtime - Use Zyrtec 45m once daily   Depression - Follow up with your PCP to make adjustment to your mediation regimen

## 2018-11-15 NOTE — Assessment & Plan Note (Signed)
Patient has history of depression on Wellbutrin. Also on Cymbalta for fibromyalgia. Xanax for anxiety. Patient says she has chronic depression and feels like Wellbutrin doesn't work. Patient on low dose, she reports tachycardia with increased doses. - Will defer to PCP  visit coming up in a Month

## 2018-11-15 NOTE — Progress Notes (Signed)
CC: Runny nose  HPI:[name] is a 68 y.o. with past medical history listed below. She is here today with a runny nose. Please see problem based charting for details of assessment and plan.   Past Medical History:  Diagnosis Date  . Adenomatous polyps 05/14/2011   Colonoscopy (05/2011): 4 mm adenomatous polyp excised endoscopically Colonoscopy (02/2002): Adenomatous polyp excised endoscopically   . Allergic rhinitis 06/01/2012  . Anemia of chronic disease 01/01/2013  . Anxiety 07/24/2010  . Aortic atherosclerosis ([place]) 10/19/2014   Seen on CT scan, currently asymptomatic  . 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 Arthritis    "lower back; hands" (02/19/2018)  . Asymptomatic cholelithiasis 09/25/2015   Seen on CT scan 08/2015  . Carotid artery stenosis    s/p right endarterectomy (06/2010) Carotid US (07/2010):  Left: Moderate-to-severe (60-79%) calcific and non-calcific plaque origin and proximal ICA and ECA   . Chronic congestive heart failure with left ventricular diastolic dysfunction ([place]) 10/21/2010  . Chronic constipation 02/03/2011  . Chronic daily headache 01/16/2014  . Chronic headache    "weekly now" (11/13/2017)  . Chronic low back pain 10/06/2012  . Chronic venous insufficiency 08/04/2012  . Clear cell renal cell carcinoma ([place]) 07/21/2011   s/p cryoablation of left RCC in 09/2011 by [name]. Followed by [name]  [facility]) .    Marland Kitchen COPD (chronic obstructive pulmonary disease) with emphysema (HCC)    PFTs 02/2012: FEV1 0.92 (40%), ratio 69, 27% increase in FEV1 with BD, TLC 91%, severe airtrapping, DLCO49% On chronic home O2. Pulmonary rehab referral 05/2012   . Daily headache    "recently" (02/19/2018)  . Depression 11/19/2005  . Esophageal stenosis 08/08/2015   Mild, benign-appearing on EGD 06/29/2015  . Fibromyalgia 08/29/2010  . Gastroesophageal reflux disease   .  History of blood transfusion    "several times"  (02/19/2018)  . History of hiatal hernia   . History of kidney stones   . Hyperlipidemia LDL goal < 100 11/20/2005  . Hypertension   . Internal hemorrhoids 08/04/2012  . Lichen sclerosus of female genitalia 01/12/2017  . Migraine    "none in years" (02/19/2018)  . Mitral stenosis    s/p Mitral valve replacement with a 27-mm pericardial porcine valve (Medtronic Mosaic valve, serial #[id number] on 09/20/10, Dr. Prescott Gum)   . Moderate to severe pulmonary hypertension ([place])    2014 TEE w PA peak pressure 46 mmHg, s/p MV replacement   . Moderately severe major depression ([place]) 11/19/2005  . Obesity (BMI 30.0-34.9) 10/23/2011  . Obstructive sleep apnea    Nocturnal polysomnography (06/2009): Moderate sleep apnea/ hypopnea syndrome , AHI 17.8 per hour with nonpositional hypopneas. CPAP titration to 12 CWP, AHI 2.4 per hour. On nocturnal CPAP via a small resMed Quattro full-face mask with heated humidifier.   . On home oxygen therapy    "2L O2 w/CPAP when I sleep" (02/19/2018)  . OSA on CPAP    "2L O2 w/CPAP when I sleep" (02/19/2018)  . Osteoporosis    DEXA (12/09/2011): L-spine T -3.7, left hip T -1.4 DEXA (12/2004): L-spine T -2.6, left hip -0.1   . Paroxysmal atrial fibrillation ([place]) 10/22/2010   s/p Left atrial maze procedure for paroxysmal atrial fibrillation on 09/20/2010 by Dr Prescott Gum.  Subsequent splenic infarct, decision was made to re-anticoagulate with coumadin, likely life-long as this is the most likely cause of the splenic infarct.   Marland Kitchen  Pneumonia    "once"  (02/19/2018)  . Pulmonary hypertension due to chronic obstructive pulmonary disease ([place]) 04/25/2016  . Right nephrolithiasis 09/06/2014   5 mm non-obstructing calculus seen on CT scan 09/05/2014   . Seborrheic keratosis 09/28/2015  . Severe obesity (BMI 35.0-39.9) with comorbidity ([place]) 10/23/2011  . Shortness of breath dyspnea   . Tobacco abuse 07/28/2012  . Type 2 diabetes mellitus  with diabetic neuropathy ([place])    Review of Systems:  Review of Systems  Constitutional: Negative for chills, fever and malaise/fatigue.  HENT: Positive for nosebleeds. Negative for ear pain.      Physical Exam:  Vitals:   11/15/18 0852 11/15/18 0857  BP: (!) 146/46 (!) 132/49  Pulse: 79 76  Temp: 98.4 F (36.9 C)   TempSrc: Oral   SpO2: 99%   Weight: 182 lb 6.4 oz (82.7 kg)   Height: _0  (1.549 m)    Physical Exam  Constitutional: She is well-developed, well-nourished, and in no distress.  On supplemental O2  HENT:  Mouth/Throat: Oropharynx is clear and moist. No oropharyngeal exudate.  Deviated septum, appears left nostril is larger. Erythamatous, but no blood visualized   Eyes: Right eye exhibits no discharge. Left eye exhibits no discharge. No scleral icterus.  Cardiovascular: Normal rate and regular rhythm.  Pulmonary/Chest: No respiratory distress (on supplemental o2). She has no rales.  Neurological: She is alert.     Assessment & Plan:   See Encounters Tab for problem based charting.  Patient seen with Dr. Dareen Piano

## 2018-11-15 NOTE — Assessment & Plan Note (Addendum)
Patient comes in with 3 day history of nasal congestion and brown mucus when blowing nose. Patient denies fever or chills. She has nasal spray at home and Chlorpheniramine, but not using. She is using fluticasone spray. Nasal  mucosa is likely irritated from oxygen use and change in weather - saline spray  - Continue Flonase as needed - Chlorpheniramine is a first generation antihistamine and I recommend only using this at bedtime with recent history of fall - Use Zyrtec 40m once daily

## 2018-11-22 ENCOUNTER — Telehealth: Payer: Self-pay | Admitting: Pharmacist

## 2018-11-22 NOTE — Telephone Encounter (Signed)
Patient called me her PST FS POC INR 2.1 (target goal 1.5 - 2.5) on 12.50m warfarin PER WEEK as: 086mM, 1.2540mn Tu/Fri; 2.5mg19m Wed/Thu/Sat. Repeat INR 29-Nov-2018. Patient was instructed to CONTINUE this regimen. No bleeding, no new medications, no missed doses. Doing well she states.

## 2018-11-22 NOTE — Progress Notes (Signed)
Internal Medicine Clinic Attending  I saw and evaluated the patient.  I personally confirmed the key portions of the history and exam documented by Dr. Court Joy and I reviewed pertinent patient test results.  The assessment, diagnosis, and plan were formulated together and I agree with the documentation in the resident's note.

## 2018-11-23 ENCOUNTER — Other Ambulatory Visit: Payer: Self-pay

## 2018-11-23 ENCOUNTER — Ambulatory Visit
Admission: RE | Admit: 2018-11-23 | Discharge: 2018-11-23 | Disposition: A | Discharge status: Home / Self Care | Payer: HMO | Source: Ambulatory Visit | Attending: Internal Medicine | Admitting: Internal Medicine

## 2018-11-23 DIAGNOSIS — Z1231 Encounter for screening mammogram for malignant neoplasm of breast: Secondary | ICD-10-CM

## 2018-12-06 ENCOUNTER — Other Ambulatory Visit: Payer: Self-pay

## 2018-12-06 ENCOUNTER — Ambulatory Visit (HOSPITAL_COMMUNITY)
Admission: RE | Admit: 2018-12-06 | Discharge: 2018-12-06 | Disposition: A | Discharge status: Home / Self Care | Payer: HMO | Source: Ambulatory Visit | Attending: Adult Health | Admitting: Adult Health

## 2018-12-06 VITALS — BP 128/59 | HR 80 | Wt 190.6 lb

## 2018-12-06 DIAGNOSIS — Z7951 Long term (current) use of inhaled steroids: Secondary | ICD-10-CM | POA: Diagnosis not present

## 2018-12-06 DIAGNOSIS — Z79899 Other long term (current) drug therapy: Secondary | ICD-10-CM | POA: Insufficient documentation

## 2018-12-06 DIAGNOSIS — I272 Pulmonary hypertension, unspecified: Secondary | ICD-10-CM | POA: Diagnosis not present

## 2018-12-06 DIAGNOSIS — Z7901 Long term (current) use of anticoagulants: Secondary | ICD-10-CM | POA: Insufficient documentation

## 2018-12-06 DIAGNOSIS — Z953 Presence of xenogenic heart valve: Secondary | ICD-10-CM | POA: Insufficient documentation

## 2018-12-06 DIAGNOSIS — I50810 Right heart failure, unspecified: Secondary | ICD-10-CM

## 2018-12-06 DIAGNOSIS — E119 Type 2 diabetes mellitus without complications: Secondary | ICD-10-CM | POA: Insufficient documentation

## 2018-12-06 DIAGNOSIS — G4733 Obstructive sleep apnea (adult) (pediatric): Secondary | ICD-10-CM | POA: Insufficient documentation

## 2018-12-06 DIAGNOSIS — E785 Hyperlipidemia, unspecified: Secondary | ICD-10-CM | POA: Insufficient documentation

## 2018-12-06 DIAGNOSIS — Z8249 Family history of ischemic heart disease and other diseases of the circulatory system: Secondary | ICD-10-CM | POA: Diagnosis not present

## 2018-12-06 DIAGNOSIS — I480 Paroxysmal atrial fibrillation: Secondary | ICD-10-CM | POA: Insufficient documentation

## 2018-12-06 DIAGNOSIS — D509 Iron deficiency anemia, unspecified: Secondary | ICD-10-CM | POA: Diagnosis not present

## 2018-12-06 DIAGNOSIS — J449 Chronic obstructive pulmonary disease, unspecified: Secondary | ICD-10-CM | POA: Diagnosis not present

## 2018-12-06 DIAGNOSIS — F17210 Nicotine dependence, cigarettes, uncomplicated: Secondary | ICD-10-CM | POA: Insufficient documentation

## 2018-12-06 DIAGNOSIS — I2510 Atherosclerotic heart disease of native coronary artery without angina pectoris: Secondary | ICD-10-CM | POA: Diagnosis not present

## 2018-12-06 DIAGNOSIS — I5032 Chronic diastolic (congestive) heart failure: Secondary | ICD-10-CM | POA: Insufficient documentation

## 2018-12-06 DIAGNOSIS — D638 Anemia in other chronic diseases classified elsewhere: Secondary | ICD-10-CM | POA: Insufficient documentation

## 2018-12-06 DIAGNOSIS — Z794 Long term (current) use of insulin: Secondary | ICD-10-CM | POA: Insufficient documentation

## 2018-12-06 LAB — CBC
HCT: 31.2 % — ABNORMAL LOW (ref 36.0–46.0)
Hemoglobin: 8.9 g/dL — ABNORMAL LOW (ref 12.0–15.0)
MCH: 23.1 pg — ABNORMAL LOW (ref 26.0–34.0)
MCHC: 28.5 g/dL — ABNORMAL LOW (ref 30.0–36.0)
MCV: 80.8 fL (ref 80.0–100.0)
Platelets: 211 10*3/uL (ref 150–400)
RBC: 3.86 MIL/uL — ABNORMAL LOW (ref 3.87–5.11)
RDW: 17.9 % — ABNORMAL HIGH (ref 11.5–15.5)
WBC: 8.2 10*3/uL (ref 4.0–10.5)
nRBC: 0 % (ref 0.0–0.2)

## 2018-12-06 LAB — BASIC METABOLIC PANEL
Anion gap: 11 (ref 5–15)
BUN: 11 mg/dL (ref 8–23)
CO2: 26 mmol/L (ref 22–32)
Calcium: 8.9 mg/dL (ref 8.9–10.3)
Chloride: 101 mmol/L (ref 98–111)
Creatinine, Ser: 1.13 mg/dL — ABNORMAL HIGH (ref 0.44–1.00)
GFR calc Af Amer: 57 mL/min — ABNORMAL LOW (ref >60)
GFR calc non Af Amer: 50 mL/min — ABNORMAL LOW (ref >60)
Glucose, Bld: 84 mg/dL (ref 70–99)
Potassium: 4 mmol/L (ref 3.5–5.1)
Sodium: 138 mmol/L (ref 135–145)

## 2018-12-06 LAB — BRAIN NATRIURETIC PEPTIDE: B Natriuretic Peptide: 40.4 pg/mL (ref 0.0–100.0)

## 2018-12-06 NOTE — Progress Notes (Signed)
ReDS Vest / Clip - 12/06/18 1000      ReDS Vest / Clip   Station Marker  B    Ruler Value  29    ReDS Value  Low volume

## 2018-12-06 NOTE — Patient Instructions (Signed)
It was great to see you today! No medication changes are needed at this time.  Labs today We will only contact you if something comes back abnormal or we need to make some changes. Otherwise no news is good news!  Your physician recommends that you schedule a follow-up appointment in: 2-3 months with Dr Aundra Dubin  Do the following things EVERYDAY: 1) Weigh yourself in the morning before breakfast. Write it down and keep it in a log. 2) Take your medicines as prescribed 3) Eat low salt foods-Limit salt (sodium) to 2000 mg per day.  4) Stay as active as you can everyday 5) Limit all fluids for the day to less than 2 liters  At the [facility], you and your health needs are our priority. As part of our continuing mission to provide you with exceptional heart care, we have created designated Provider Care Teams. These Care Teams include your primary Cardiologist (physician) and Advanced Practice Providers (APPs- Physician Assistants and Nurse Practitioners) who all work together to provide you with the care you need, when you need it.   You may see any of the following providers on your designated Care Team at your next follow up: Marland Kitchen Dr Glori Bickers . Dr Loralie Champagne . Darrick Grinder, NP . Lyda Jester, PA   Please be sure to bring in all your medications bottles to every appointment.

## 2018-12-06 NOTE — Progress Notes (Signed)
Advanced Heart Failure Clinic Note  PCP: Dr. Eppie Gibson HF Cardiology: Dr. Aundra Dubin  68 y.o. female with COPD, mitral stenosis s/p bioprosthetic MV replacement, paroxysmal atrial fibrillation, GI bleeding, and pulmonary hypertension/RV failure, followed by Dr. Aundra Dubin, who presents for 1 month followup.    She was admitted with GI bleeding, thought to be AVMs, in 2/18 and warfarin was stopped.  She was admitted in 3/18 with chest pain, dyspnea, and elevated troponin.  She had not been taking her Lasix regularly.  Coronary angiography showed nonobstructive CAD. It was suspected that her elevated troponin was 2/2 demand ischemia from acute CHF. RHC showed elevated right and left heart filling pressures and severe pulmonary hypertension.  V/Q scan showed no chronic PE, and PFTs showed severe obstructive lung disease.  Echo showed severe RV dilation with mildly decreased systolic function.  She was diuresed and eventually discharged to [facility].   She was admitted again in 2/19 with anemia, got 2 units PRBCs.    Echo in 5/19 with EF 60-65%, mildly decreased RV systolic function, PASP 49 mmHg, normal bioprosthetic mitral valve.   She is on chronic home O2 and uses CPAP nightly. She has chronic orthopnea at baseline and has to sleep on her side on a pillow.  At last OV w/ Dr. Aundra Dubin 11/05/18, she complained of exertional dyspnea and was felt to be volume overloaded. Clinic wt was 184 lb. ReDs clip reading elevated at 40%. Her diuretics and supplemental K were increased>> lasix  80 qam/40 qpm and KCl to 40 qam/20 qpm. A FLP was also obtained. LDL was at goal at 48 mg/dL. CBC showed chronic anemia, but drop in Hgb from 11.4>>9.2 in the course of 4 weeks. She presented back for repeat BMP 1 week after diuretic and supp K increase. There was a slight bump in SCr from 0.98>>1.08. K was stable at 3.8. NA was 134.   She presents back for 4 week f/u. Here w/ her sister. She still has some mild dyspnea w/ ambulation. No  CP. Reports increased urination w/ lasix increase. ReDs clip measurement improved, now at 28%, however her office weight is up compared to last visit, 184>>190. Bibasilar crackles noted on exam, w/ slight improvement after cough. Denies fever and chills. No cough.    ECG: Not performed. RRR on exam.   REDS vest reading 28%    Labs (3/18): K 4.8 => 3.9, creatinine 1.2 => 1.09, hgb 10.2 => 10.7, BNP 131 Labs (5/18): K 4, creatinine 1.0 Labs (2/19): K 4.5, creatinine 0.94, transferrin saturation 3%.  Labs (3/19): hgb 11.3 Labs (4/19): K 4.1, creatinine 1.0, LDL 36 Labs (1/10): creatinine 0.85 Labs (7/20): hgb 10.7  PMH: 1. Mitral stenosis: s/p bioprosthetic mitral valve replacement.  2. COPD: Severe by 3/18 PFTs.  She is still smoking.  3. Atrial fibrillation: Paroxysmal. Has been off warfarin since GI bleed in 2/18.  4. OSA: Uses CPAP.  5. Type II diabetes. 6. Pulmonary hypertension/RV failure: Suspect primarily group 3 PH due to underlying lung disease (COPD).  - RHC (3/18) with mean RA 14, PA 77/28 mean 47, mean PCWP 23, CI 2.6, PVR 4.9.  - Echo (3/18): EF 60-65%, severe RV dilation with mildly decreased RV systolic function, PASP 76 mmHg.  Bioprosthetic MV looks ok.  - V/Q scan (3/18): No evidence for chronic PE.  - PFTs (3/18): Severe obstructive airways disease.  - Echo in 5/19 with EF 60-65%, mildly decreased RV systolic function, PASP 49 mmHg, normal bioprosthetic mitral valve.  7. Anemia: Fe deficiency and anemia of chronic disease.   - Suspect GI AVMs as cause of 2/18 bleed . Coumadin stopped.  8. CAD: LHC (3/18) with 30% LM, 40% proximal RCA.  9. Hyperlipidemia  SH: Lives alone in [place].  Active smoker.  No ETOH.   Family History  Problem Relation Age of Onset  . Peptic Ulcer Disease Father   . Heart attack Father 64       Died of MI at age 2  . Heart attack Brother 53       Died of MI at age 70  . Obesity Brother   . Pneumonia Mother   . Healthy Sister   .  Lupus Daughter   . Obsessive Compulsive Disorder Daughter    ROS: All systems reviewed and negative except as per HPI.   Current Outpatient Medications  Medication Sig Dispense Refill  . albuterol (PROAIR HFA) 108 (90 Base) MCG/ACT inhaler Inhale 2 puffs into the lungs every 6 (six) hours as needed for shortness of breath. 18 g 5  . ALPRAZolam (XANAX) 1 MG tablet TAKE 1 TABLET BY MOUTH EVERY NIGHT AT BEDTIME AS NEEDED FOR INSOMNIA. MAY TAKE 1/2 TABLET BY MOUTH DURING THE DAY FOR ANXIETY 55 tablet 4  . benzonatate (TESSALON) 100 MG capsule Take 1 capsule (100 mg total) by mouth 3 (three) times daily as needed for cough. 90 capsule 11  . Blood Glucose Monitoring Suppl (ONETOUCH VERIO) W/DEVICE KIT 1 each by Does not apply route 4 (four) times daily. 1 kit 0  . buPROPion (WELLBUTRIN) 75 MG tablet Take 1 tablet (75 mg total) by mouth 2 (two) times daily. 180 tablet 3  . chlorpheniramine (CHLOR-TRIMETON) 4 MG tablet Take 4 mg by mouth daily as needed for allergies.    . clobetasol cream (TEMOVATE) 6.25 % Apply 1 application topically 2 (two) times daily as needed (lichen sclerosus flare). 45 g 3  . DULoxetine (CYMBALTA) 60 MG capsule Take 1 capsule (60 mg total) by mouth 2 (two) times daily. 180 capsule 3  . ferrous gluconate (FERGON) 324 MG tablet Take 1 tablet (324 mg total) by mouth 2 (two) times daily with a meal. (Patient taking differently: Take 648 mg by mouth See admin instructions. Take 2 tablets (648 mg) by mouth daily with lunch or supper) 180 tablet 3  . fluticasone (FLONASE) 50 MCG/ACT nasal spray Place 2 sprays into both nostrils daily. 16 g 11  . Fluticasone-Salmeterol (ADVAIR DISKUS) 500-50 MCG/DOSE AEPB Inhale 1 puff into the lungs 2 (two) times daily. 180 each 3  . furosemide (LASIX) 80 MG tablet Take 1 tablet by mouth every morning and a 1/2 tablet every evening 45 tablet 2  . gabapentin (NEURONTIN) 300 MG capsule Take 2 capsules (600 mg total) by mouth 3 (three) times daily. 540  capsule 3  . glucose blood (ONETOUCH VERIO) test strip Use to check blood sugar 4 times daily. diag code E11.40. Insulin dependent 375 each 1  . HYDROcodone-acetaminophen (NORCO/VICODIN) 5-325 MG tablet Take 1-2 tablets by mouth every 6 (six) hours as needed for severe pain. 115 tablet 0  . hydrocortisone cream 1 % Apply to affected area 2 times daily 14 g 0  . insulin aspart (NOVOLOG) 100 UNIT/ML FlexPen INJECT 15 UNITS TO 25 UNITS SUBCUTANEOUSLY 4 TIMES DAILY BEFORE MEALS AND AT BEDTIME 15 mL 4  . Insulin Pen Needle 32G X 4 MM MISC Use to inject insulin up to 6 times a day 500 each 3  .  Insulin Syringe-Needle U-100 31G X 15/64" 0.5 ML MISC Use to inject insulin up to 4 times a day 400 each 3  . ipratropium-albuterol (DUONEB) 0.5-2.5 (3) MG/3ML SOLN INHALE THE CONTENTS OF 1 VIAL VIA NEBULIZER EVERY 6 HOURS AS NEEDED FOR SHORTNESS OF BREATH 360 mL 3  . Lancets Misc. (ACCU-CHEK FASTCLIX LANCET) KIT Check your blood 4 times a day dx code 250.00 insulin requiring 1 kit 2  . liraglutide (VICTOZA) 18 MG/3ML SOPN Inject 0.3 mLs (1.8 mg total) into the skin daily. 3 pen 11  . metoprolol succinate (TOPROL-XL) 50 MG 24 hr tablet Take 1 tablet (50 mg total) by mouth daily. 90 tablet 3  . nicotine (NICODERM CQ - DOSED IN MG/24 HOURS) 14 mg/24hr patch Place 1 patch (14 mg total) onto the skin daily. 28 patch 0  . nystatin cream (MYCOSTATIN) Apply 1 application topically 2 (two) times daily as needed (rash).   3  . omeprazole (PRILOSEC) 40 MG capsule Take 1 capsule (40 mg total) by mouth 2 (two) times daily. 180 capsule 3  . ondansetron (ZOFRAN) 4 MG tablet TAKE 1 TABLET BY MOUTH EVERY 8 HOURS AS NEEDED FOR NAUSEA OR VOMITING 20 tablet 2  . OXYGEN Inhale 2 L into the lungs as needed (for shortness of breath while active).     . potassium chloride SA (K-DUR) 20 MEQ tablet Take 2 tablets by mouth every morning and 1 tablet by mouth every evening 90 tablet 2  . PRESCRIPTION MEDICATION Inhale into the lungs at  bedtime. CPAP    . rosuvastatin (CRESTOR) 20 MG tablet Take 1 tablet (20 mg total) by mouth at bedtime. 90 tablet 3  . Tiotropium Bromide Monohydrate (SPIRIVA RESPIMAT) 2.5 MCG/ACT AERS Inhale 2 puffs into the lungs daily. 1 Inhaler 11  . warfarin (COUMADIN) 2.5 MG tablet 2.43m warfarin every day except on Mondays - take NO TABLETS and on Tuesdays take ONLY 1/2 pill 90 tablet 1   Current Facility-Administered Medications  Medication Dose Route Frequency Provider Last Rate Last Dose  . senna-docusate (Senokot-S) tablet 1 tablet  1 tablet Oral QHS PRN Hoffman, Jessica Ratliff, DO       BP (!) 128/59   Pulse 80   Wt 86.5 kg (190 lb 9.6 oz)   SpO2 96%   BMI 36.01 kg/m  PHYSICAL EXAM: General:  Well appearing elderly WF. No respiratory difficulty HEENT: normal Neck: supple. no JVD. Carotids 2+ bilat; no bruits. No lymphadenopathy or thyromegaly appreciated. Cor: PMI nondisplaced. Regular rate & rhythm. No rubs, gallops or murmurs. Lungs: faint crackles at the bases. Slight improvement w/ cough.  Abdomen: soft, nontender, nondistended. No hepatosplenomegaly. No bruits or masses. Good bowel sounds. Extremities: no cyanosis, clubbing, rash, trace bilateral LE edema Neuro: alert & oriented x 3, cranial nerves grossly intact. moves all 4 extremities w/o difficulty. Affect pleasant.   Assessment/Plan: 1. Pulmonary hypertension: Suspect that this is primarily combination WHO group 2 PH (pulmonary venous hypertension from LV diastolic dysfunction) and group 3 PH (severe COPD, OSA).  V/Q scan showed no evidence for chronic PE.  Dr. MAundra Dubindoes not think that she will benefit from selective pulmonary vasodilators. Key focus is to continue w/ good diuresis + use of oxygen and CPAP.  2. Chronic diastolic CHF with prominent RV dysfunction: in the setting of severe COPD.  Felt to be volume overloaded at last OV, ReDS vest reading was abnormal at 40%, also suggestive of fluid overload.  NYHA class III  symptoms (dyspnea is likely  a combination of CHF and COPD) => dyspnea is chronic. Diuretics and supp K were increased at last OV>> Lasix to 80 qam/40 qpm + KCl to 40 qam/20 qpm.  F/u BMP on 10/5 showed slight bump in SCr from 0.98>>1.08 and stable K at 3.9. NA 134. ReDS measurement improved today at 28%, however her weight appears to be up 6 lb from 184 last visit to 190 lb today. She has bibasilar crackles on exam w/ slight improvement in cough. No fever or chills. Has trace bilateral LEE on exam. Will check BNP today for further assessment of volume status. If normal, plan to continue current diuretic regimen. Lasix 80/40. If BNP abnormal, will titrate further. Check BMP today.  3. Atrial fibrillation: Paroxysmal.  RRR on exam. No breakthrough symptoms.  She had been off anticoagulation due to GI bleeding, now back on warfarin with INR goal 1.5-2.5.  She had GI bleeding in 2/19, no recent melena.   - Keep warfarin with INR 1.5-2.5 for now. Dr. Johney Maine follows INR.  - With most recent data regarding NOACs and bioprosthetic valves, It has been sugggested in the past that she switch to apixaban given GI bleeding history. However, she has wanted to stay on warfarin.  4. h/o GI bleeding: Possible GI AVM. Admitted with anemia again in 2/19, got 2 units PRBCs.  No overt bleeding now.  - CBC at last OV showed mild anemia w/ Hgb at 9.4 (this was a drop from 11.2 over the course of 1 month). Will repeat CBC again.   5. COPD: Severe by PFTs. Unfortunately, she is still smoking 1/2-1 full ppd.  Pt encouraged to quit again.  6. CAD: Nonobstructive. She is on a statin.  -recent FLP 11/05/18 showed controlled LDL at 48 mg/dL.   F/u w/ Dr. Aundra Dubin in 3 months   Brittainy Rosita Fire 12/06/2018

## 2018-12-08 ENCOUNTER — Telehealth: Payer: Self-pay | Admitting: Dietician

## 2018-12-08 ENCOUNTER — Other Ambulatory Visit: Payer: Self-pay | Admitting: Internal Medicine

## 2018-12-08 DIAGNOSIS — F419 Anxiety disorder, unspecified: Secondary | ICD-10-CM

## 2018-12-08 NOTE — Telephone Encounter (Signed)
Yes to be telehealth appointment.  I will call her on November 5 and less that is no longer a good day for her.  I may call her in the afternoon.  Thank you

## 2018-12-08 NOTE — Telephone Encounter (Signed)
Got a letter about covid-19 is getting worse, cancelled CGM appointments and wants to know if she can do a telehealth appointment with Dr. Lynnae January.  Wants Dr. Elie Confer to know that she got strips for [facility] late, plans do that this coming Monday.

## 2018-12-09 ENCOUNTER — Ambulatory Visit: Payer: HMO | Admitting: Dietician

## 2018-12-10 ENCOUNTER — Other Ambulatory Visit: Payer: Self-pay | Admitting: Internal Medicine

## 2018-12-10 DIAGNOSIS — M797 Fibromyalgia: Secondary | ICD-10-CM

## 2018-12-10 DIAGNOSIS — E1140 Type 2 diabetes mellitus with diabetic neuropathy, unspecified: Secondary | ICD-10-CM

## 2018-12-10 DIAGNOSIS — Z794 Long term (current) use of insulin: Secondary | ICD-10-CM

## 2018-12-13 ENCOUNTER — Telehealth: Payer: Self-pay | Admitting: Pharmacist

## 2018-12-13 NOTE — Telephone Encounter (Signed)
PST FS POC INR value provided to me by patient = 1.9 (goal 1.5 - 2.5) on 12.54m warfarin/wk as: 2.558mSu0m47m1.25mgTu2.5mgWe2.5mg42m.25mgFri2.5mgSa. Repeat INR next Monday. No bleeding. No clotting. Has adequate supply of warfarin.

## 2018-12-15 ENCOUNTER — Telehealth: Payer: Self-pay | Admitting: Dietician

## 2018-12-15 NOTE — Telephone Encounter (Signed)
Is requesting a new meter. We agreed to discuss this further at her appointment tomorrow.

## 2018-12-16 ENCOUNTER — Other Ambulatory Visit: Payer: Self-pay

## 2018-12-16 ENCOUNTER — Ambulatory Visit (HOSPITAL_COMMUNITY)
Admission: RE | Admit: 2018-12-16 | Discharge: 2018-12-16 | Disposition: A | Discharge status: Home / Self Care | Payer: HMO | Source: Ambulatory Visit | Attending: Internal Medicine | Admitting: Internal Medicine

## 2018-12-16 ENCOUNTER — Ambulatory Visit: Payer: HMO | Admitting: Dietician

## 2018-12-16 ENCOUNTER — Encounter: Payer: Self-pay | Admitting: Internal Medicine

## 2018-12-16 ENCOUNTER — Other Ambulatory Visit: Payer: Self-pay | Admitting: Dietician

## 2018-12-16 ENCOUNTER — Ambulatory Visit (INDEPENDENT_AMBULATORY_CARE_PROVIDER_SITE_OTHER): Payer: HMO | Admitting: Internal Medicine

## 2018-12-16 VITALS — BP 117/84 | HR 75 | Temp 99.0°F | Wt 180.9 lb

## 2018-12-16 DIAGNOSIS — J449 Chronic obstructive pulmonary disease, unspecified: Secondary | ICD-10-CM

## 2018-12-16 DIAGNOSIS — Z794 Long term (current) use of insulin: Secondary | ICD-10-CM | POA: Diagnosis not present

## 2018-12-16 DIAGNOSIS — J439 Emphysema, unspecified: Secondary | ICD-10-CM

## 2018-12-16 DIAGNOSIS — I509 Heart failure, unspecified: Secondary | ICD-10-CM

## 2018-12-16 DIAGNOSIS — E1140 Type 2 diabetes mellitus with diabetic neuropathy, unspecified: Secondary | ICD-10-CM | POA: Diagnosis not present

## 2018-12-16 DIAGNOSIS — M797 Fibromyalgia: Secondary | ICD-10-CM | POA: Diagnosis not present

## 2018-12-16 DIAGNOSIS — G319 Degenerative disease of nervous system, unspecified: Secondary | ICD-10-CM | POA: Diagnosis not present

## 2018-12-16 DIAGNOSIS — Z79891 Long term (current) use of opiate analgesic: Secondary | ICD-10-CM

## 2018-12-16 DIAGNOSIS — Z0000 Encounter for general adult medical examination without abnormal findings: Secondary | ICD-10-CM

## 2018-12-16 DIAGNOSIS — F17210 Nicotine dependence, cigarettes, uncomplicated: Secondary | ICD-10-CM

## 2018-12-16 DIAGNOSIS — N183 Chronic kidney disease, stage 3 unspecified: Secondary | ICD-10-CM

## 2018-12-16 DIAGNOSIS — K5520 Angiodysplasia of colon without hemorrhage: Secondary | ICD-10-CM | POA: Diagnosis not present

## 2018-12-16 DIAGNOSIS — R519 Headache, unspecified: Secondary | ICD-10-CM

## 2018-12-16 DIAGNOSIS — Z79899 Other long term (current) drug therapy: Secondary | ICD-10-CM

## 2018-12-16 DIAGNOSIS — I130 Hypertensive heart and chronic kidney disease with heart failure and stage 1 through stage 4 chronic kidney disease, or unspecified chronic kidney disease: Secondary | ICD-10-CM | POA: Diagnosis not present

## 2018-12-16 DIAGNOSIS — Z7901 Long term (current) use of anticoagulants: Secondary | ICD-10-CM | POA: Insufficient documentation

## 2018-12-16 DIAGNOSIS — S0990XA Unspecified injury of head, initial encounter: Secondary | ICD-10-CM | POA: Diagnosis not present

## 2018-12-16 DIAGNOSIS — M545 Low back pain, unspecified: Secondary | ICD-10-CM

## 2018-12-16 DIAGNOSIS — N1831 Chronic kidney disease, stage 3a: Secondary | ICD-10-CM | POA: Insufficient documentation

## 2018-12-16 DIAGNOSIS — G8929 Other chronic pain: Secondary | ICD-10-CM | POA: Diagnosis not present

## 2018-12-16 DIAGNOSIS — D509 Iron deficiency anemia, unspecified: Secondary | ICD-10-CM

## 2018-12-16 DIAGNOSIS — W19XXXA Unspecified fall, initial encounter: Secondary | ICD-10-CM | POA: Diagnosis not present

## 2018-12-16 DIAGNOSIS — E1122 Type 2 diabetes mellitus with diabetic chronic kidney disease: Secondary | ICD-10-CM | POA: Insufficient documentation

## 2018-12-16 DIAGNOSIS — I6789 Other cerebrovascular disease: Secondary | ICD-10-CM | POA: Insufficient documentation

## 2018-12-16 DIAGNOSIS — G4733 Obstructive sleep apnea (adult) (pediatric): Secondary | ICD-10-CM

## 2018-12-16 DIAGNOSIS — Z6834 Body mass index (BMI) 34.0-34.9, adult: Secondary | ICD-10-CM

## 2018-12-16 DIAGNOSIS — M5441 Lumbago with sciatica, right side: Secondary | ICD-10-CM | POA: Diagnosis not present

## 2018-12-16 HISTORY — DX: Type 2 diabetes mellitus with diabetic chronic kidney disease: E11.22

## 2018-12-16 LAB — CBC
HCT: 32 % — ABNORMAL LOW (ref 36.0–46.0)
Hemoglobin: 9.2 g/dL — ABNORMAL LOW (ref 12.0–15.0)
MCH: 22.8 pg — ABNORMAL LOW (ref 26.0–34.0)
MCHC: 28.8 g/dL — ABNORMAL LOW (ref 30.0–36.0)
MCV: 79.4 fL — ABNORMAL LOW (ref 80.0–100.0)
Platelets: 245 10*3/uL (ref 150–400)
RBC: 4.03 MIL/uL (ref 3.87–5.11)
RDW: 18 % — ABNORMAL HIGH (ref 11.5–15.5)
WBC: 11.2 10*3/uL — ABNORMAL HIGH (ref 4.0–10.5)
nRBC: 0 % (ref 0.0–0.2)

## 2018-12-16 LAB — POCT GLYCOSYLATED HEMOGLOBIN (HGB A1C): Hemoglobin A1C: 7.1 % — AB (ref 4.0–5.6)

## 2018-12-16 LAB — GLUCOSE, CAPILLARY: Glucose-Capillary: 241 mg/dL — ABNORMAL HIGH (ref 70–99)

## 2018-12-16 MED ORDER — HYDROCODONE-ACETAMINOPHEN 5-325 MG PO TABS: 2.0000 | ORAL_TABLET | Freq: Four times a day (QID) | ORAL | 0 refills | Status: DC | PRN

## 2018-12-16 MED ORDER — ONETOUCH VERIO VI STRP: ORAL_STRIP | 3 refills | Status: DC

## 2018-12-16 MED ORDER — BENZONATATE 100 MG PO CAPS: 100.0000 mg | ORAL_CAPSULE | Freq: Three times a day (TID) | ORAL | 11 refills | Status: DC | PRN

## 2018-12-16 NOTE — Progress Notes (Signed)
7.1

## 2018-12-16 NOTE — Assessment & Plan Note (Signed)
This problem is chronic and controlled.  She states she has a chronic cough which was productive but worse the past month.  She did not cough while I was in the room.  She request a printed prescription for [employer] as her insurance does not cover it.  She takes them 2-3 times a week to help her cough.  PLAN : [name] as needed

## 2018-12-16 NOTE — Telephone Encounter (Signed)
Refill request

## 2018-12-16 NOTE — Progress Notes (Signed)
Subjective:    Patient ID: [name], female    DOB: 04-25-1950, 68 y.o.   MRN: [id number]  HPI  [name] is here for DM & chronic pain. Please see the A&P for the status of the pt's chronic medical problems.  ROS : per ROS section and in problem oriented charting. All other systems are negative.  PMHx, Soc hx, and / or Fam hx : Smokes self rolled cigarettes.  She is able to entertain her young granddaughter, [name], through video chat.  Review of Systems  HENT: Negative for rhinorrhea, sneezing and sore throat.   Respiratory: Positive for cough.        Dyspnea only with back pain Her cough has been going on for years but has been worse 1 month.  It is productive.  Gastrointestinal: Negative for blood in stool.  Genitourinary: Negative for hematuria.  Musculoskeletal: Positive for arthralgias, back pain, gait problem and myalgias.  Neurological: Positive for headaches.  Psychiatric/Behavioral: Positive for sleep disturbance.       Objective:   Physical Exam Constitutional:      General: She is not in acute distress.    Appearance: Normal appearance. She is not ill-appearing, toxic-appearing or diaphoretic.  HENT:     Head: Normocephalic.     Right Ear: External ear normal.     Left Ear: External ear normal.  Eyes:     General: No scleral icterus.       Right eye: No discharge.        Left eye: No discharge.     Extraocular Movements: Extraocular movements intact.     Conjunctiva/sclera: Conjunctivae normal.  Cardiovascular:     Comments: Right radial pulse 2, left radial pulse 1 Pulmonary:     Effort: Pulmonary effort is normal. No respiratory distress.     Comments: Able to speak in full sentences.  No tachypnea. Musculoskeletal:     Comments: No bony enlargement of the hands.  No tenderness over the joints.  No joint bogginess.  Skin:    General: Skin is warm and dry.  Neurological:     General: No focal deficit present.     Mental  Status: She is alert. Mental status is at baseline.  Psychiatric:        Mood and Affect: Mood normal.        Behavior: Behavior normal.        Thought Content: Thought content normal.        Judgment: Judgment normal.       Assessment & Plan:  [name] had a list of concerns that she wanted to discuss.  I think we got all of them except her abnormal bowel movements.

## 2018-12-16 NOTE — Assessment & Plan Note (Signed)
This problem is chronic and stable.  She has CKD stage III likely due to diabetes, heart failure, and hypertension.  All of these problems are being treated.  Her creatinine has been relatively stable over the past 7 years.  PLAN : Stable

## 2018-12-16 NOTE — Assessment & Plan Note (Signed)
This problem is chronic and uncontrolled.  She had moderate sleep apnea on a sleep study in 2011.  Dr. Caroline More plan for her chronic headaches had been to repeat her sleep study to ensure she is on the proper settings.  We discussed that today and she wants to pursue another sleep study.  Due to her chronic comorbidities, we are going to get an in lab sleep study.  PLAN : In lab sleep study

## 2018-12-16 NOTE — Assessment & Plan Note (Signed)
This problem is chronic and worsened.  She recently fell hitting her left head and resulted in a black eye and an egg on her left temple.  Her headaches have been worse since then.  A subdural hematoma was my concern and I obtained a stat CT scan which ruled out bleeding.  It did show stable mild atrophy, small vessel disease, foci of arterial vascular calcification.  Plan : Follow for chronic headaches Obtain sleep study

## 2018-12-16 NOTE — Assessment & Plan Note (Signed)
She requested a walker without wheels today.  I am setting up home health to ensure she gets the right walker and the right height.

## 2018-12-16 NOTE — Assessment & Plan Note (Signed)
This problem is chronic and stable.  BMI is 34.4.  Her obesity is linked with heart failure, diabetes, COPD, cardiovascular disease, kidney disease...  PLAN : follow

## 2018-12-16 NOTE — Patient Instructions (Signed)
1. I am checking your B12 and hemoglobin 2. I am ordering a sleep study 3. I am ordering home health for a walker 4. I am getting a CT scan of head today

## 2018-12-16 NOTE — Assessment & Plan Note (Signed)
This problem is chronic and uncontrolled.  For her chronic pain issues, including back pain, fibromyalgia, and headaches, she is on gabapentin, Cymbalta, hydrocodone, and Xanax.  She is on hydrocodone 5/325 and states it is not helping her back pain at all.  She states it does help her fibromyalgia pain.  She had been getting 120 every 30 days but Dr. Eppie Gibson was decreasing that because she was not getting it filled every 30 days.  I inherited her on 98.  Today, she states she does not like that number because she is supposed to be getting 120.  I discussed that she was not getting it filled every 30 days and therefore did not need 120.  She stated okay but then stated that it did not make sense to give her an odd number of pills as she always takes 2 and never a single pill.  She states she usually takes 2 pills twice a day or 3 times a day.  I am going to refill her prescription for 114, with instructions for 2 TID as needed.  I will follow her refill history and adjust as needed.  Today, she complains of burning in her feet and ankles when she wakes up in the morning.  She also complains that her thumbs hurt at night but then it spreads to her whole hand.  She also has drawing up of the left third and fourth digits.  PLAN : Hydrocodone 5/325 #114 to take 2 TID as needed Check vitamin B12 due to burning in her feet Follow symptoms

## 2018-12-16 NOTE — Assessment & Plan Note (Signed)
This problem is chronic and stable.  She started off today by asking for a blood transfusion.  She had recently been seen for hemorrhoids.  She denies any current bleeding.  Her hemoglobin in September was 9.4.  In October, 8.9.  Today it was 9.2.  Her ferritin has been low and she is on iron pills.  Her B12 has been low normal.  Her repeat hemoglobin today is reassuring and she does not need a blood transfusion.  We can discuss iron transfusion at her next appointment.  PLAN : Follow Discuss iron transfusion.

## 2018-12-17 LAB — MICROALBUMIN / CREATININE URINE RATIO
Creatinine, Urine: 33.3 mg/dL
Microalb/Creat Ratio: <9 mg/g creat (ref 0–29)
Microalbumin, Urine: <3 ug/mL

## 2018-12-19 LAB — TOXASSURE SELECT,+ANTIDEPR,UR

## 2018-12-20 ENCOUNTER — Telehealth: Payer: Self-pay | Admitting: Pharmacist

## 2018-12-20 NOTE — Telephone Encounter (Signed)
Patient called with PST FS POC INR value collected today 2.2 on 12.86m warfarin PER WEEK. Advised to CONTINUE same regiment. Retest in 1 wk. No bleeding symptoms.

## 2018-12-21 ENCOUNTER — Other Ambulatory Visit: Payer: Self-pay | Admitting: Internal Medicine

## 2018-12-21 ENCOUNTER — Telehealth: Payer: Self-pay | Admitting: Internal Medicine

## 2018-12-21 DIAGNOSIS — K649 Unspecified hemorrhoids: Secondary | ICD-10-CM

## 2018-12-21 DIAGNOSIS — K625 Hemorrhage of anus and rectum: Secondary | ICD-10-CM

## 2018-12-21 NOTE — Telephone Encounter (Signed)
rtc to pt, request sent

## 2018-12-21 NOTE — Telephone Encounter (Signed)
Pt requesting a callback [phone]

## 2018-12-23 ENCOUNTER — Other Ambulatory Visit: Payer: Self-pay

## 2018-12-23 NOTE — Telephone Encounter (Signed)
From medlist the pt should have refill at pharm, called pharm X3 cutoff after music started. Will try again

## 2018-12-23 NOTE — Telephone Encounter (Signed)
HYDROcodone-acetaminophen (NORCO/VICODIN) 5-325 MG tablet   Refill request @  [facility], [address] [phone] (Phone) [phone] (Fax)

## 2018-12-27 ENCOUNTER — Telehealth: Payer: Self-pay | Admitting: Pharmacist

## 2018-12-27 NOTE — Telephone Encounter (Signed)
Patient provided FS POC PST INR result today = 2.8 (goal 1.5 - 2.5) on 12.20m warfarin/wk. Will DECREASE to 11.25 warfarin/wk and repeat PST FS POC INR next Monday 03-Jan-2019. Patient does state that after last Mondays INR (2.2) on 20-Dec-2018, that she had "hemorrhoidal bleeding on two occasions." and has now stopped. States she had a similar episode in July 2020. States she has a cream that has been prescribed/recommended that contains a steroid (prednisone she states) and has used this, resulting in bleeding stopping. Denies any other signs of bleeding. No change in calibre or diameter of stool. Will repeat FS POC PST next Monday on lowered dose. She understands if bleeding with stool were to return or worsen she is to call [facility] report to [facility].

## 2018-12-29 ENCOUNTER — Other Ambulatory Visit: Payer: Self-pay | Admitting: Internal Medicine

## 2018-12-29 DIAGNOSIS — Z794 Long term (current) use of insulin: Secondary | ICD-10-CM

## 2018-12-29 DIAGNOSIS — I480 Paroxysmal atrial fibrillation: Secondary | ICD-10-CM

## 2018-12-29 DIAGNOSIS — E1140 Type 2 diabetes mellitus with diabetic neuropathy, unspecified: Secondary | ICD-10-CM

## 2019-01-03 ENCOUNTER — Telehealth: Payer: Self-pay | Admitting: Pharmacist

## 2019-01-03 NOTE — Telephone Encounter (Signed)
Patient provided results of PST FS POC INR = 2.2 on 11.25 mg warfarin/wk as:    Su-2.5mM-0mgT-1.25mgW-2.5mgTh-1.25mgF-2.5mgSa-1.25mg Patient was advised to CONTINUE this regimen and repeat PST FS POC INR on 10-Jan-2019. Patient target goal is 1.5 - 2.5. Denies any symptoms of bleeding or embolic events. Adequate supply of warfarin on hand.

## 2019-01-04 ENCOUNTER — Other Ambulatory Visit (HOSPITAL_COMMUNITY): Payer: Self-pay | Admitting: Cardiology

## 2019-01-04 DIAGNOSIS — I5032 Chronic diastolic (congestive) heart failure: Secondary | ICD-10-CM

## 2019-01-10 ENCOUNTER — Telehealth: Payer: Self-pay | Admitting: Pharmacist

## 2019-01-10 NOTE — Telephone Encounter (Signed)
Patient self-reported results of her FS, POC, PST INR, 1.7 (goal 1.5 - 2.5) on 11.6m warfarin/wk. INCREASED to 12.587mwarfarin/wk, repeat PST FS POC INR on 17-Jan-2019.Reports no bleeding, no new medications, no missed doses and she states she has an adequate supply of warfarin on-hand.

## 2019-01-12 ENCOUNTER — Telehealth: Payer: Self-pay | Admitting: *Deleted

## 2019-01-12 NOTE — Telephone Encounter (Signed)
Per PCP's note on 12/16/2018 [facility] PT was to help patient obtain walker. Call placed to patient. States she prefers [facility] vs going to [facility] for falls assessment. Will forward to PCP for ambulatory referral to [facility]. L. Ducatte, BSN, RN-BC

## 2019-01-12 NOTE — Addendum Note (Signed)
Addended by: [name] A on: 01/12/2019 04:20 PM   Modules accepted: Orders

## 2019-01-12 NOTE — Telephone Encounter (Signed)
I put in a new order attached to the November 5 office visit.  I hope it is the right order.

## 2019-01-12 NOTE — Telephone Encounter (Signed)
Pt calling asking about her sleep study and walker, has not heard from anyone, wants an update

## 2019-01-12 NOTE — Telephone Encounter (Signed)
Spoke with Scarlette Ar) @ the [facility] who states they have her orders.  The pt's Ins is ([employer]) which can take up to 30 days to approve.  Kia states they have not rec'd her approval yet, and will f/u again with the patient's ins and reach out to the pt and sch her upon the Approval.

## 2019-01-13 NOTE — Telephone Encounter (Signed)
DHR:CBUL  Referral has been correctly added to the [facility] and is ready for review.

## 2019-01-13 NOTE — Telephone Encounter (Signed)
Community message sent to Janae Sauce at [facility] to see if she can take patient for [facility] PT. L. Ducatte, BSN, RN-BC

## 2019-01-14 NOTE — Telephone Encounter (Signed)
[place], [place], Orvis Brill, RN; Dennard Nip; [place], [place], [place]; [place], [place]        I am sorry but we are unable to accept this referral at this time. I attempted to call the office but you were unavailable to talk so I informed [name] and she was going to send you the message. Let me know if there is anything else that I can help you with.

## 2019-01-17 ENCOUNTER — Telehealth: Payer: Self-pay | Admitting: Pharmacist

## 2019-01-17 NOTE — Telephone Encounter (Signed)
Patient contacted me providing results of PST FS POC INR = 1.7. on 12.87m warfarin per week. Will CONTINUE same regimen. Repeat PST FS POC INR in 7 days as per agreement with instrument provider. Denies any symptoms/signs of bleeding, no chest pain, etc. No new medications she states. Adequate supply of warfarin on-hand.

## 2019-01-17 NOTE — Telephone Encounter (Signed)
I am sending this to [place] Spaniel with [facility] to see if they can take her, will wait for response [place], Mamie C12/7/202010:22 AM

## 2019-01-19 NOTE — Telephone Encounter (Signed)
Nevin Bloodgood from [facility] not able to take patient so I have sent a message to [place], [place] C12/9/202010:57 AM

## 2019-01-21 ENCOUNTER — Other Ambulatory Visit: Payer: Self-pay | Admitting: Internal Medicine

## 2019-01-21 DIAGNOSIS — J439 Emphysema, unspecified: Secondary | ICD-10-CM

## 2019-01-24 ENCOUNTER — Telehealth: Payer: Self-pay | Admitting: Pharmacist

## 2019-01-24 NOTE — Telephone Encounter (Signed)
Patient texted me with results of PST, FS, POC INR = 1.7 (goal:  1.5 - 2.5). Will continue SAME regimen: 1x2.52m on Su/Tu/We/Th; 1/2 x 2.577mon Th/Sa. NO tablet(s) on Mondays. Repeat INR in 1 week. No bleeding endorsed. No new medications. Adequate supply of warfarin on-hand she states.

## 2019-01-25 NOTE — Telephone Encounter (Signed)
I spoke with [facility] they will not be able to take her because of staffing.I will try another agency [place], [place] C12/15/202010:01 AM

## 2019-01-25 NOTE — Telephone Encounter (Signed)
RE: home health PT Received: Today Message Contents  Jiles Crocker  [place], [place], [place]; [place], [place]; [place], [place] Dy; Janae Sauce; Silvano Rusk, Orvis Brill, RN  Good morning!   [facility] has accepted this referral. We will get this processed for a SOC within 24-48 hours. Thanks!!

## 2019-01-26 ENCOUNTER — Telehealth: Payer: Self-pay | Admitting: *Deleted

## 2019-01-26 DIAGNOSIS — I130 Hypertensive heart and chronic kidney disease with heart failure and stage 1 through stage 4 chronic kidney disease, or unspecified chronic kidney disease: Secondary | ICD-10-CM | POA: Diagnosis not present

## 2019-01-26 DIAGNOSIS — E1140 Type 2 diabetes mellitus with diabetic neuropathy, unspecified: Secondary | ICD-10-CM | POA: Diagnosis not present

## 2019-01-26 DIAGNOSIS — M5441 Lumbago with sciatica, right side: Secondary | ICD-10-CM | POA: Diagnosis not present

## 2019-01-26 DIAGNOSIS — D509 Iron deficiency anemia, unspecified: Secondary | ICD-10-CM | POA: Diagnosis not present

## 2019-01-26 DIAGNOSIS — F17210 Nicotine dependence, cigarettes, uncomplicated: Secondary | ICD-10-CM | POA: Diagnosis not present

## 2019-01-26 DIAGNOSIS — R519 Headache, unspecified: Secondary | ICD-10-CM | POA: Diagnosis not present

## 2019-01-26 DIAGNOSIS — M797 Fibromyalgia: Secondary | ICD-10-CM | POA: Diagnosis not present

## 2019-01-26 DIAGNOSIS — E1122 Type 2 diabetes mellitus with diabetic chronic kidney disease: Secondary | ICD-10-CM | POA: Diagnosis not present

## 2019-01-26 DIAGNOSIS — G8929 Other chronic pain: Secondary | ICD-10-CM | POA: Diagnosis not present

## 2019-01-26 DIAGNOSIS — N183 Chronic kidney disease, stage 3 unspecified: Secondary | ICD-10-CM | POA: Diagnosis not present

## 2019-01-26 DIAGNOSIS — G4733 Obstructive sleep apnea (adult) (pediatric): Secondary | ICD-10-CM | POA: Diagnosis not present

## 2019-01-26 DIAGNOSIS — J439 Emphysema, unspecified: Secondary | ICD-10-CM | POA: Diagnosis not present

## 2019-01-26 DIAGNOSIS — I509 Heart failure, unspecified: Secondary | ICD-10-CM | POA: Diagnosis not present

## 2019-01-26 NOTE — Telephone Encounter (Signed)
HH PT calls she is ordering a 2 wheeled walker for pt, paperwork will come to clinic for dr butcher's signature VO for [facility] PT for 1x week for 4 weeks Do you agree?

## 2019-01-26 NOTE — Telephone Encounter (Signed)
Arnetha Gula, RN; Jiles Crocker; [place], [place], [place]; [employer], Odie Sera  I have noted this on the pt's referral as well. Thanks.   Angie       Previous Messages   ----- Message -----  From: Velora Heckler, RN  Sent: 01/26/2019  8:55 AM EST  To: Janae Sauce, Jiles Crocker, [place], *  Subject: RE: home health PT                Corene Cornea, thank you! Please pass along to the PT that PCP is hoping they can determine the best walker for the patient as well. Ander Purpura

## 2019-01-29 NOTE — Telephone Encounter (Signed)
Agree. thanks

## 2019-01-31 ENCOUNTER — Telehealth: Payer: Self-pay | Admitting: Pharmacist

## 2019-01-31 NOTE — Telephone Encounter (Signed)
Patient texted by secure chat her INR (PST, FS, POC) = 1.8 (goal 1.5 - 2.5). Denies any signs/symtoms of bleeding. No symptoms of VTE/cp, etc. Will CONTINUE same regimen using 2.88m green-warfarin tablets: No warfarin on Mondays, 1 x 5 mg on Tu/We/Fr/Su; 1/2 x 2.583m(1.2521mose) on Thur/Sat. Repeat PST FS POC INR on 07-Feb-2019.

## 2019-02-07 ENCOUNTER — Telehealth: Payer: Self-pay | Admitting: Pharmacist

## 2019-02-07 NOTE — Telephone Encounter (Signed)
Patient provided me with results of her PST POC FS INR = 1.5 (goal 1.5 - 2.5) on total weekly dose of warfarin = 12.84m per wk. INCREASED to 13.756mper week, repeat PST next Monday 14-Feb-2019. No bleeding reported. No symptoms of embolic dx reported.

## 2019-02-08 ENCOUNTER — Other Ambulatory Visit: Payer: Self-pay | Admitting: *Deleted

## 2019-02-08 ENCOUNTER — Ambulatory Visit: Payer: Self-pay | Admitting: *Deleted

## 2019-02-08 NOTE — Patient Outreach (Signed)
  [facility]) Care Management Chronic Special Needs Program    02/08/2019  Name: [name], DOB: 02-08-1951  MRN: [id number]   [name] is enrolled in a chronic special needs plan for Diabetes and Heart Failure. Attempted to reach [name] via mobile number to completed follow up assessment;  no answer; left HIPAA compliant message requesting return call.  Plan: If client does not return call, will make second outreach call within one week.  Kelli Churn RN, CCM, [facility] Management [phone]

## 2019-02-09 ENCOUNTER — Other Ambulatory Visit: Payer: Self-pay | Admitting: *Deleted

## 2019-02-09 DIAGNOSIS — E1140 Type 2 diabetes mellitus with diabetic neuropathy, unspecified: Secondary | ICD-10-CM

## 2019-02-09 DIAGNOSIS — Z794 Long term (current) use of insulin: Secondary | ICD-10-CM

## 2019-02-09 DIAGNOSIS — J439 Emphysema, unspecified: Secondary | ICD-10-CM

## 2019-02-09 DIAGNOSIS — I5032 Chronic diastolic (congestive) heart failure: Secondary | ICD-10-CM

## 2019-02-09 NOTE — Patient Outreach (Signed)
[facility]) Care Management Chronic Special Needs Program  02/09/2019  Name: Stassi Fadely DOB: 11/16/1950  MRN: [id number]  Ms. Annick Dimaio is enrolled in a chronic special needs plan for Diabetes and Heart Failure. Reviewed and updated care plan.  Subjective: Mrs. Edmundson says she is doing "about the same".  Says she has remained free of the Covid virus and is contemplating whether she will get the Covid vaccine when it is available to her. She says her granddaughter is against her receiving it because of the allergic reactions that have been reported.  She says she remains unsteady on her feet and occasionally falls "as I have been doing for years". She says she is currently receiving home health physical therapy through [facility]. She says the therapist will provide a walker for her to help her with ambulation in the hone.  She reports her COPD and heart failure are at baseline and she takes her medications as directed. She says she does not monitor her fluid intake and won't start monitoring it now but likely does not consume more than the 2 liter limit.  She says she spoke with both Dr. Lynnae January and Barry Brunner about the [facility] flash glucose monitoring system but has decided not to proceed with trying it. She says she wants to give it further thought.  Goals Addressed            This Visit's Progress     Patient Stated   . Patient Stated- "improve my back pain" (pt-stated)       11/09/18 Discussed possible chronic back pain treatment options to discuss with primary care provider including steroid injections and spinal stimulator 02/09/19 Client reports back pain at baseline on current treatment regimen      Other   . Blood Pressure < 130/80       11/09/18 Mailed client Emmi education handout :" Diabetes and Blood Pressure 02/09/19 Reviewed HTN treatment plan, assessed medication adherence and reviewed BP readings at provider's office  of 10/26 and 12/16/18    . Client understands the importance of follow-up with providers by attending scheduled visits   On track    Client states she is adherent with keeping provider appointments    . Client will report no fall or injuries in the next 3 months.   Not on track    11/09/18 Mailed client Emmi education handout ; "Preventing Falls in the Older Adult: and Check for Safety brochure 02/09/19 Client states she continues to be unsteady on feet with occasional falls but without injury, currently receiving home health physical therapy and says she is to receive a walker to assist with ambulation in the house    . Client will verbalize knowledge of chronic lung disease as evidenced by no ED visits or Inpatient stays related to chronic lung disease        11/09/18 Mailed client Emmi education handout : COPD: When To Get Help 02/09/19 Client states breathing issues are at baseline, no recent ED or inpatient admissions, reviewed COPD treatment and action plan    . Client will verbalize knowledge of self management of Hypertension as evidences by BP reading of 140/90 or less; or as defined by provider       11/09/18 Mailed client Emmi education handout :" Diabetes and Blood Pressure 02/09/19 Reviewed HTN treatment plan, assessed medication adherence and reviewed BP readings at provider's office of 10/26 and 12/16/18    . Client/Caregiver will verbalize understanding of  instructions related to self-care and safety       11/09/18 Mailed client Emmi education handout; " How to Help Your Memory" and Check For Safety brochure 02/09/19 assessed fall status and benefits of currently receiving home physical therapy, reviewed strategies to prevent falls, discussed home safety alarm, will make referral to [place] social worker for home safety alarm systems    . HEMOGLOBIN A1C < 8.0       02/09/19 Discussed target Hgb A1C reviewed current Hgb A1C results of 7.1% on 12/16/18, assessed hypoglycemic frequency and  threshold (<100) , suggested she continue to consider trial of [employer] flash glucose monitoring system since her Hgb A1C is showing an increasing trend over the last year Reviewed diabetes self management actions:  Glucose monitoring per provider recommendations  Eat Healthy- 11/09/18 mailed client Emmi education handout on "Diabetic Meal Planning"   Check feet daily  Visit provider every 3-6 months as directed  Hbg A1C level every 3-6 months.  Eye Exam yearly    . Maintain timely refills of Heart Failure medication as prescribed within the year    On track    Client states she refills her medications in a timely fashion and review of medication dispense report supports this statement    . COMPLETED: Obtain annual  Lipid Profile, LDL-C   On track    Lipid profile completed 11/05/18- revealed  all components meeting target except HDL slightly low at  38- discussed strategies to raise HDL    . Quit smoking / using tobacco   Not on track   . COMPLETED: Visit Primary Care Provider or Cardiologist at least 2 times per year   On track    Client completed appointments with primary care provider on 1/10, 6/4, 8/18 and 12/16/18 Client completed appointments with cardiology providers on 9/25 and 12/06/18     Assessment: Client is meeting diabetes self-management goal of hemoglobin A1C of <8% with most recent reading of 7.1% on 12/16/18 with occasional reports of hypoglycemia . Client has good understanding of:  COVID-19 cause, symptoms, precautions (social distancing, stay at home order, hand washing, wear face covering when unable to maintain or ensure 6 foot social distancing), and symptoms requiring provider notification. She received her flu vaccine on 11/15/18 and is contemplating receiving the Covid vaccine as she is aware she is at risk for more severe symptoms (I'd probably croak") if she were to contract the virus.   Plan:   Send successful outreach letter with a copy of their  individualized care plan to client Send individual care plan to provider Chronic care management coordinator will outreach in:  6 Months Will refer to:  Social Work for home safety alarm    Kelli Churn RN, CCM, [facility] [phone] .

## 2019-02-10 ENCOUNTER — Ambulatory Visit: Payer: Self-pay | Admitting: *Deleted

## 2019-02-11 DIAGNOSIS — F17210 Nicotine dependence, cigarettes, uncomplicated: Secondary | ICD-10-CM | POA: Diagnosis not present

## 2019-02-11 DIAGNOSIS — E1122 Type 2 diabetes mellitus with diabetic chronic kidney disease: Secondary | ICD-10-CM | POA: Diagnosis not present

## 2019-02-11 DIAGNOSIS — R519 Headache, unspecified: Secondary | ICD-10-CM | POA: Diagnosis not present

## 2019-02-11 DIAGNOSIS — J439 Emphysema, unspecified: Secondary | ICD-10-CM | POA: Diagnosis not present

## 2019-02-11 DIAGNOSIS — G8929 Other chronic pain: Secondary | ICD-10-CM | POA: Diagnosis not present

## 2019-02-11 DIAGNOSIS — M797 Fibromyalgia: Secondary | ICD-10-CM | POA: Diagnosis not present

## 2019-02-11 DIAGNOSIS — I130 Hypertensive heart and chronic kidney disease with heart failure and stage 1 through stage 4 chronic kidney disease, or unspecified chronic kidney disease: Secondary | ICD-10-CM | POA: Diagnosis not present

## 2019-02-11 DIAGNOSIS — I509 Heart failure, unspecified: Secondary | ICD-10-CM | POA: Diagnosis not present

## 2019-02-11 DIAGNOSIS — N183 Chronic kidney disease, stage 3 unspecified: Secondary | ICD-10-CM | POA: Diagnosis not present

## 2019-02-11 DIAGNOSIS — D509 Iron deficiency anemia, unspecified: Secondary | ICD-10-CM | POA: Diagnosis not present

## 2019-02-11 DIAGNOSIS — G4733 Obstructive sleep apnea (adult) (pediatric): Secondary | ICD-10-CM | POA: Diagnosis not present

## 2019-02-11 DIAGNOSIS — M5441 Lumbago with sciatica, right side: Secondary | ICD-10-CM | POA: Diagnosis not present

## 2019-02-11 DIAGNOSIS — E1140 Type 2 diabetes mellitus with diabetic neuropathy, unspecified: Secondary | ICD-10-CM | POA: Diagnosis not present

## 2019-02-13 IMAGING — CT CT ABDOMEN WO/W CM
3 of 12 series · 10 of 46 positions shown, 16 images · IV contrast (iopamidol)
Comparison: 03/28/2016 CT abdomen.

CLINICAL DATA: Status post cryoablation of lower left renal cell
carcinoma 09/19/2011, presenting for 5 year follow-up.

EXAM:
CT ABDOMEN WITHOUT AND WITH CONTRAST
TECHNIQUE: Multidetector CT imaging of the abdomen was performed following the
standard protocol before and following the bolus administration of
intravenous contrast.
CONTRAST:  100mL 43MYR7-TXX IOPAMIDOL (43MYR7-TXX) INJECTION 61%

[Series 2: axial pre · axial · non-contrast · 0.86mm/px · z∈[-276,-108]mm · 5 of 85 slices shown, 10 images]
[im 15/85  soft-tissue]
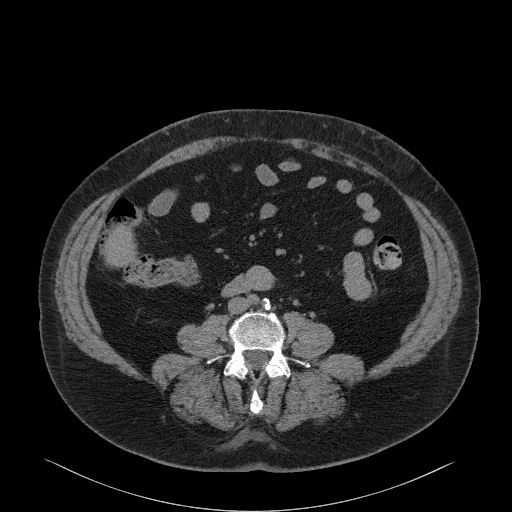
[im 15/85  bone]
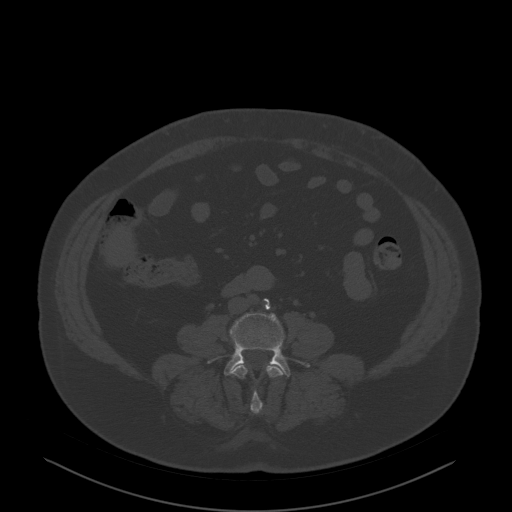
[im 29/85  soft-tissue]
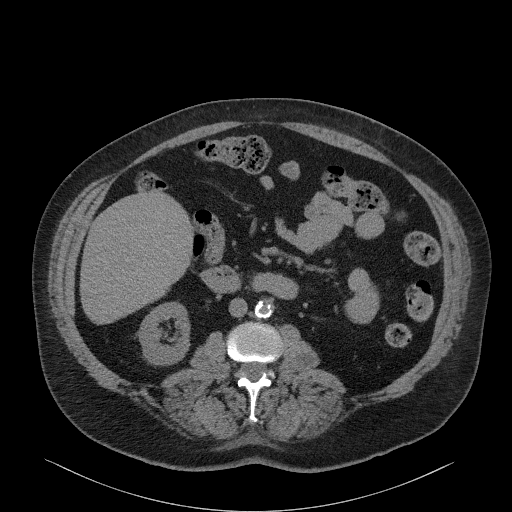
[im 29/85  lung]
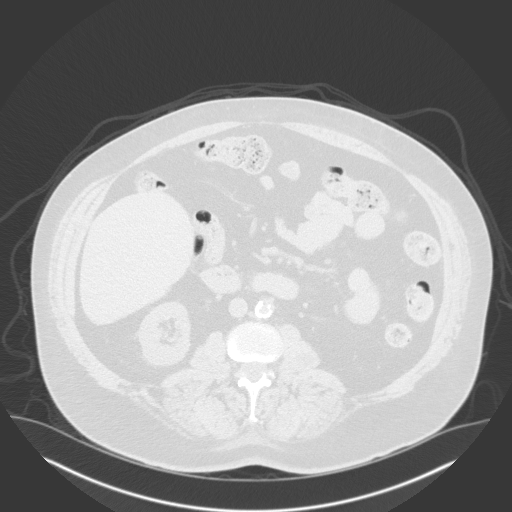
[im 43/85  soft-tissue]
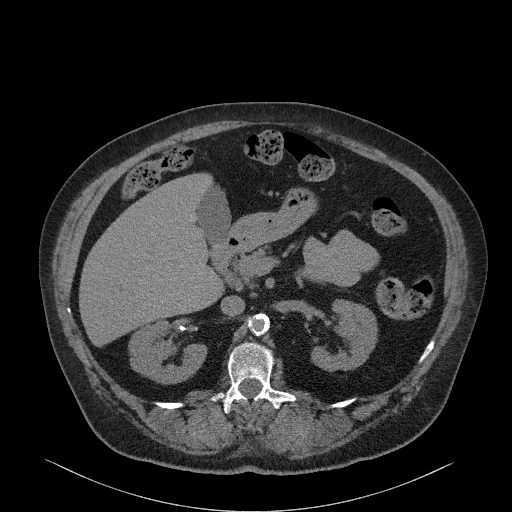
[im 43/85  lung]
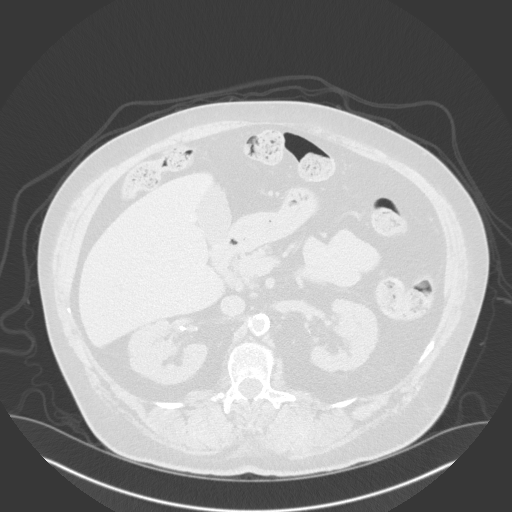
[im 57/85  soft-tissue]
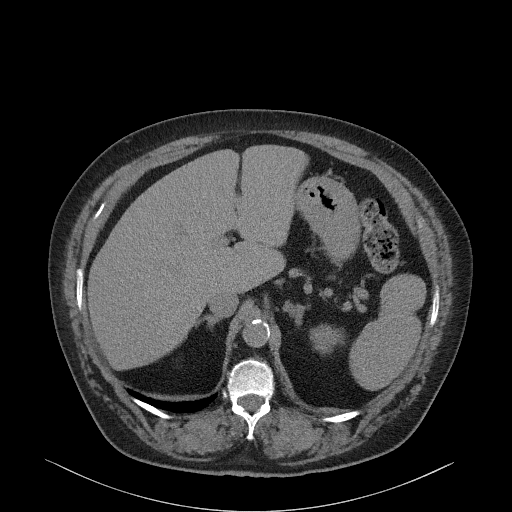
[im 57/85  lung]
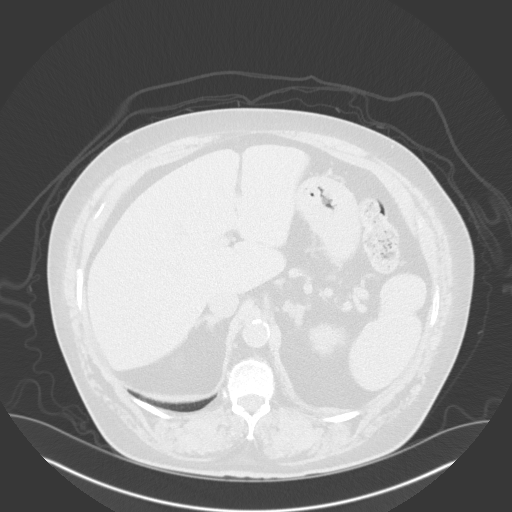
[im 71/85  soft-tissue]
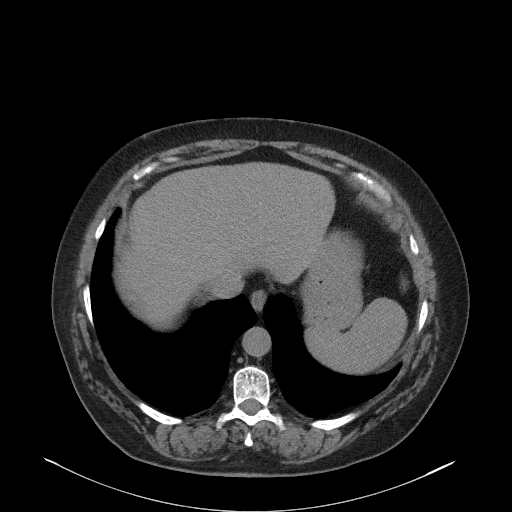
[im 71/85  lung]
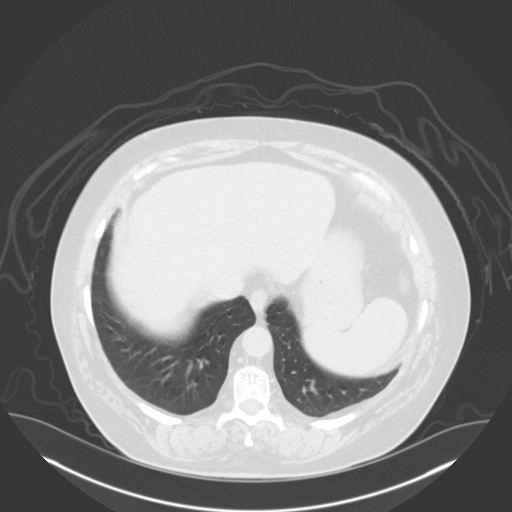

[Series 4: coronal pre · coronal · non-contrast · 0.50mm/px · 2 of 112 slices shown, 3 images]
[im 38/112  soft-tissue]
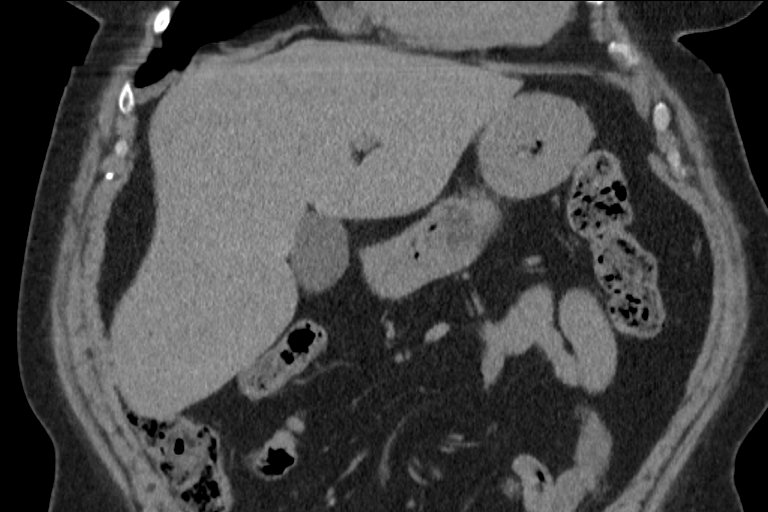
[im 38/112  bone]
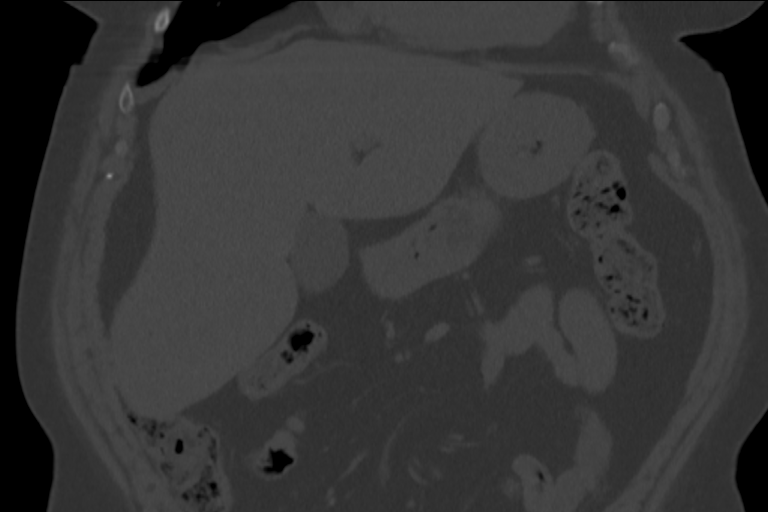
[im 75/112  soft-tissue]
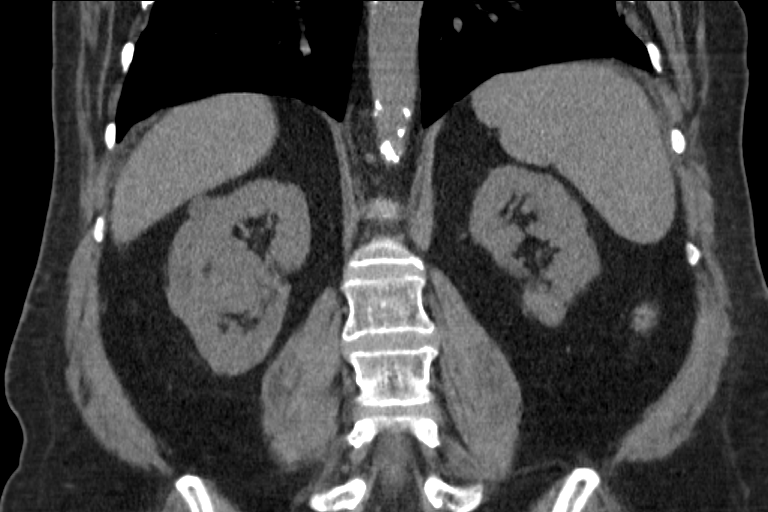

[Series 6: axial arterial · axial · arterial · 0.86mm/px · z∈[-274,-172]mm · 3 of 87 slices shown]
[im 18/87  soft-tissue]
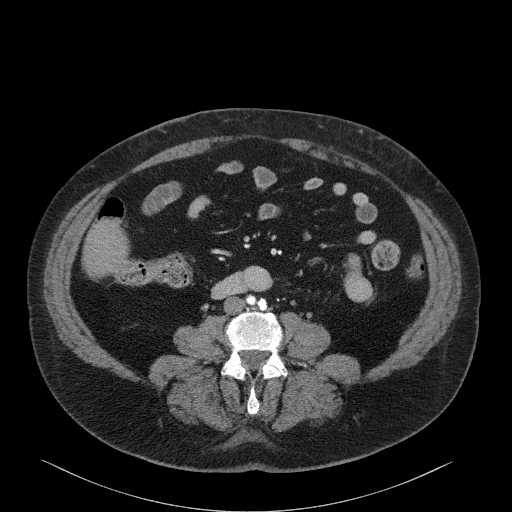
[im 35/87  soft-tissue]
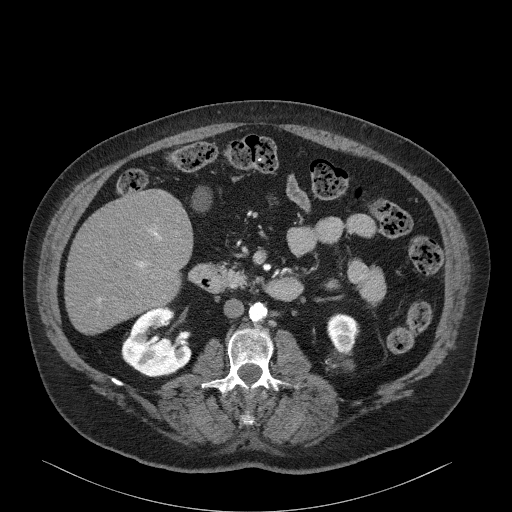
[im 52/87  soft-tissue]
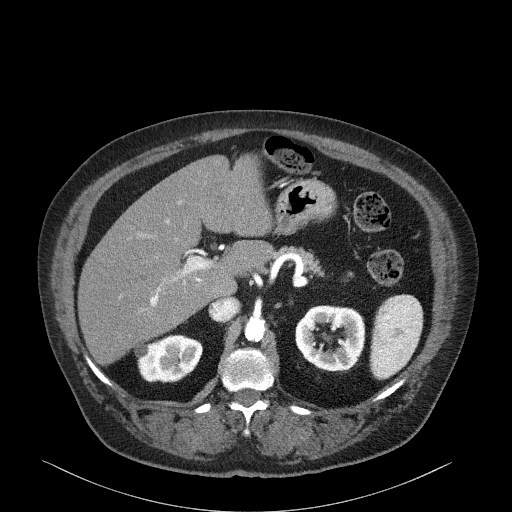

[10 of 46 positions shown; findings below may reference images not displayed]

FINDINGS: Lower chest: Centrilobular emphysema with no acute abnormality at
the lung bases.

Hepatobiliary: Normal liver with no liver mass. Cholelithiasis, with
no gallbladder wall thickening, gallbladder distention or
pericholecystic fluid. No biliary ductal dilatation.

Pancreas: Normal, with no mass or duct dilation.

Spleen: Normal size. No mass.

Adrenals/Urinary Tract: Normal adrenals. Nonobstructing 5 mm lower
right renal stone. No additional renal stones. No hydronephrosis.

Ablation defect in the posterior lower left kidney measures 3.3 x
3.1 cm (series 2/image 50), previously 3.3 x 3.2 cm, stable. There
is heterogeneous fat and soft tissue density with scattered
calcifications within the ablation defect, unchanged. There is a
small eccentric 1.0 cm soft tissue density nodular focus at the left
posterior margin of the ablation defect demonstrating low-level
delayed enhancement (series 11/image 40), unchanged back to
08/23/2015, compatible with fibrosis. No new solid enhancing
component at the ablation site.

Two simple right renal cysts, largest 1.3 cm in the lateral upper
right kidney. Subcentimeter hypodense renal cortical lesion in the
medial upper left kidney, too small to characterize, stable.
Heterogeneous hypodense 0.7 cm renal cortical lesion in the lateral
upper left kidney (series 11/image 30), too small to accurately
characterize, previously 0.5 cm on 03/28/2016 and not definitely
seen on 08/23/2015.

Stomach/Bowel: Grossly normal stomach. Visualized small and large
bowel is normal caliber, with no bowel wall thickening.

Vascular/Lymphatic: Atherosclerotic nonaneurysmal abdominal aorta.
Patent portal, splenic, hepatic and renal veins. No pathologically
enlarged lymph nodes in the abdomen.

Other: No pneumoperitoneum, ascites or focal fluid collection.

Musculoskeletal: No aggressive appearing focal osseous lesions. Mild
thoracolumbar spondylosis.
IMPRESSION: 1. Stable ablation defect in the posterior lower left kidney with no
findings suspicious for local tumor recurrence. Stable nodular focus
of suspected benign scar tissue at the posterior margin of the
ablation defect, stable since 08/23/2015.
[DATE]. No evidence of metastatic disease in the abdomen.
3. Tiny heterogeneous 0.7 cm renal cortical lesion in the lateral
upper left kidney with minimal interval growth, too small to
characterize. This lesion warrants attention on follow-up MRI
(preferred) or CT abdomen without and with IV contrast in 6 months.
4. Additional chronic findings include Aortic Atherosclerosis
(EI0V8-RA2.2) and Emphysema ([id number].G), cholelithiasis and
nonobstructing right renal stone.

## 2019-02-14 ENCOUNTER — Telehealth: Payer: Self-pay | Admitting: Pharmacist

## 2019-02-14 ENCOUNTER — Other Ambulatory Visit: Payer: Self-pay

## 2019-02-14 NOTE — Telephone Encounter (Signed)
Patient texted me results of PST, FS, POC INR collected today (and faxed to [facility]). INR = 1.9 (target goal 1.5 - 2.5). Will CONTINUE same regimen of 1x2.34m on Tu/We/Th/Fri and Sun. Take only 1/2 x 2.529m(1.2573mon Satudays. Take NO warfarin on Mondays. Repeat PST, FS, POC INR in 1 week. Denies any symptoms of bleeding or embolic events.

## 2019-02-14 NOTE — Patient Outreach (Signed)
[facility]) Care Management  02/14/2019  [name] 15-May-1950 [id number]   Social work referral received on 02/09/19 from [facility], Kelli Churn.  " C-SNP client with DM and CHF. Lives alone with multiple comorbidities and history of chronic falls. Please discuss affordable home safety alarms with her" Unsuccessful outreach to patient today.  Left voicemail message and mailed Unsuccessful Outreach Letter. Will attempt to reach again within four business days.  Ronn Melena, BSW Social Worker [phone]

## 2019-02-15 DIAGNOSIS — N183 Chronic kidney disease, stage 3 unspecified: Secondary | ICD-10-CM | POA: Diagnosis not present

## 2019-02-15 DIAGNOSIS — M797 Fibromyalgia: Secondary | ICD-10-CM | POA: Diagnosis not present

## 2019-02-15 DIAGNOSIS — M5441 Lumbago with sciatica, right side: Secondary | ICD-10-CM | POA: Diagnosis not present

## 2019-02-15 DIAGNOSIS — R519 Headache, unspecified: Secondary | ICD-10-CM | POA: Diagnosis not present

## 2019-02-15 DIAGNOSIS — E1122 Type 2 diabetes mellitus with diabetic chronic kidney disease: Secondary | ICD-10-CM | POA: Diagnosis not present

## 2019-02-15 DIAGNOSIS — I509 Heart failure, unspecified: Secondary | ICD-10-CM | POA: Diagnosis not present

## 2019-02-15 DIAGNOSIS — G4733 Obstructive sleep apnea (adult) (pediatric): Secondary | ICD-10-CM | POA: Diagnosis not present

## 2019-02-15 DIAGNOSIS — E1140 Type 2 diabetes mellitus with diabetic neuropathy, unspecified: Secondary | ICD-10-CM | POA: Diagnosis not present

## 2019-02-15 DIAGNOSIS — G8929 Other chronic pain: Secondary | ICD-10-CM | POA: Diagnosis not present

## 2019-02-15 DIAGNOSIS — J439 Emphysema, unspecified: Secondary | ICD-10-CM | POA: Diagnosis not present

## 2019-02-15 DIAGNOSIS — F17210 Nicotine dependence, cigarettes, uncomplicated: Secondary | ICD-10-CM | POA: Diagnosis not present

## 2019-02-15 DIAGNOSIS — D509 Iron deficiency anemia, unspecified: Secondary | ICD-10-CM | POA: Diagnosis not present

## 2019-02-15 DIAGNOSIS — I130 Hypertensive heart and chronic kidney disease with heart failure and stage 1 through stage 4 chronic kidney disease, or unspecified chronic kidney disease: Secondary | ICD-10-CM | POA: Diagnosis not present

## 2019-02-16 ENCOUNTER — Other Ambulatory Visit: Payer: Self-pay

## 2019-02-16 ENCOUNTER — Ambulatory Visit: Payer: Self-pay

## 2019-02-16 NOTE — Patient Outreach (Signed)
[facility]) Care Management  02/16/2019  [name] 03/01/1950 [id number]   Social work referral received on 02/09/19 from [facility], Kelli Churn.  " C-SNP client with DM and CHF. Lives alone with multiple comorbidities and history of chronic falls. Please discuss affordable home safety alarms with her" Successful outreach to patient today.  Patient requested that list of medical alert systems in the $20-$25/month range be sent to her.  Mailed today.  Will follow up within the next two weeks to ensure receipt.   Ronn Melena, BSW Social Worker [phone]

## 2019-02-17 ENCOUNTER — Other Ambulatory Visit: Payer: Self-pay

## 2019-02-17 ENCOUNTER — Encounter (HOSPITAL_COMMUNITY): Payer: Self-pay | Admitting: Cardiology

## 2019-02-17 ENCOUNTER — Ambulatory Visit (HOSPITAL_COMMUNITY)
Admission: RE | Admit: 2019-02-17 | Discharge: 2019-02-17 | Disposition: A | Discharge status: Home / Self Care | Payer: HMO | Source: Ambulatory Visit | Attending: Cardiology | Admitting: Cardiology

## 2019-02-17 VITALS — BP 124/38 | HR 76 | Wt 181.0 lb

## 2019-02-17 DIAGNOSIS — Z953 Presence of xenogenic heart valve: Secondary | ICD-10-CM | POA: Insufficient documentation

## 2019-02-17 DIAGNOSIS — E785 Hyperlipidemia, unspecified: Secondary | ICD-10-CM | POA: Insufficient documentation

## 2019-02-17 DIAGNOSIS — D649 Anemia, unspecified: Secondary | ICD-10-CM

## 2019-02-17 DIAGNOSIS — Z7951 Long term (current) use of inhaled steroids: Secondary | ICD-10-CM | POA: Diagnosis not present

## 2019-02-17 DIAGNOSIS — I5032 Chronic diastolic (congestive) heart failure: Secondary | ICD-10-CM | POA: Diagnosis not present

## 2019-02-17 DIAGNOSIS — G4733 Obstructive sleep apnea (adult) (pediatric): Secondary | ICD-10-CM | POA: Diagnosis not present

## 2019-02-17 DIAGNOSIS — I50810 Right heart failure, unspecified: Secondary | ICD-10-CM | POA: Diagnosis not present

## 2019-02-17 DIAGNOSIS — Z794 Long term (current) use of insulin: Secondary | ICD-10-CM | POA: Insufficient documentation

## 2019-02-17 DIAGNOSIS — Z79899 Other long term (current) drug therapy: Secondary | ICD-10-CM | POA: Insufficient documentation

## 2019-02-17 DIAGNOSIS — I272 Pulmonary hypertension, unspecified: Secondary | ICD-10-CM | POA: Insufficient documentation

## 2019-02-17 DIAGNOSIS — J449 Chronic obstructive pulmonary disease, unspecified: Secondary | ICD-10-CM | POA: Insufficient documentation

## 2019-02-17 DIAGNOSIS — I480 Paroxysmal atrial fibrillation: Secondary | ICD-10-CM | POA: Insufficient documentation

## 2019-02-17 DIAGNOSIS — F17200 Nicotine dependence, unspecified, uncomplicated: Secondary | ICD-10-CM | POA: Diagnosis not present

## 2019-02-17 DIAGNOSIS — I2510 Atherosclerotic heart disease of native coronary artery without angina pectoris: Secondary | ICD-10-CM | POA: Insufficient documentation

## 2019-02-17 DIAGNOSIS — Z7901 Long term (current) use of anticoagulants: Secondary | ICD-10-CM | POA: Diagnosis not present

## 2019-02-17 DIAGNOSIS — E119 Type 2 diabetes mellitus without complications: Secondary | ICD-10-CM | POA: Diagnosis not present

## 2019-02-17 DIAGNOSIS — Z8249 Family history of ischemic heart disease and other diseases of the circulatory system: Secondary | ICD-10-CM | POA: Insufficient documentation

## 2019-02-17 LAB — BASIC METABOLIC PANEL
Anion gap: 8 (ref 5–15)
BUN: 11 mg/dL (ref 8–23)
CO2: 26 mmol/L (ref 22–32)
Calcium: 9 mg/dL (ref 8.9–10.3)
Chloride: 104 mmol/L (ref 98–111)
Creatinine, Ser: 1.11 mg/dL — ABNORMAL HIGH (ref 0.44–1.00)
GFR calc Af Amer: 59 mL/min — ABNORMAL LOW (ref >60)
GFR calc non Af Amer: 51 mL/min — ABNORMAL LOW (ref >60)
Glucose, Bld: 164 mg/dL — ABNORMAL HIGH (ref 70–99)
Potassium: 4.4 mmol/L (ref 3.5–5.1)
Sodium: 138 mmol/L (ref 135–145)

## 2019-02-17 LAB — CBC
HCT: 28.5 % — ABNORMAL LOW (ref 36.0–46.0)
Hemoglobin: 7.9 g/dL — ABNORMAL LOW (ref 12.0–15.0)
MCH: 22.1 pg — ABNORMAL LOW (ref 26.0–34.0)
MCHC: 27.7 g/dL — ABNORMAL LOW (ref 30.0–36.0)
MCV: 79.8 fL — ABNORMAL LOW (ref 80.0–100.0)
Platelets: 238 10*3/uL (ref 150–400)
RBC: 3.57 MIL/uL — ABNORMAL LOW (ref 3.87–5.11)
RDW: 19.2 % — ABNORMAL HIGH (ref 11.5–15.5)
WBC: 8.4 10*3/uL (ref 4.0–10.5)
nRBC: 0.2 % (ref 0.0–0.2)

## 2019-02-17 MED ORDER — FUROSEMIDE 80 MG PO TABS: ORAL_TABLET | ORAL | 2 refills | Status: DC

## 2019-02-17 NOTE — Patient Instructions (Addendum)
INCREASE Lasix to 59m (1 tab) twice a day for 2 days, THEN start taking 863m(1 tab) in the morning and 4080m1/2 tab) in the evening thereafter.  INCREASE Potassium to 40 meq (2 tabs) twice a day for 2 days, then back to original dose of  40 meq (2 tabs) in the morning and 20 meq (1 tab) in the evening  Labs today  We will only contact you if something comes back abnormal or we need to make some changes. Otherwise no news is good news!  Your physician recommends that you schedule a follow-up appointment in: 1 month with Nurse Practitioner    [facility] and your health needs are our priority. As part of our continuing mission to provide you with exceptional heart care, we have created designated Provider Care Teams. These Care Teams include your primary Cardiologist (physician) and Advanced Practice Providers (APPs- Physician Assistants and Nurse Practitioners) who all work together to provide you with the care you need, when you need it.   You may see any of the following providers on your designated Care Team at your next follow up: . DMarland Kitchen DanGlori BickersDr DalLoralie ChampagneAmyDarrick GrinderP . BriLyda JesterA . LauAudry RilesharmD   Please be sure to bring in all your medications bottles to every appointment.

## 2019-02-17 NOTE — Progress Notes (Signed)
PCP: Bartholomew Crews, MD HF Cardiology: Dr. Aundra Dubin  69 y.o. with COPD, mitral stenosis s/p bioprosthetic MV replacement, paroxysmal atrial fibrillation, GI bleeding, and pulmonary hypertension/RV failure presents for cardiology followup.  She was admitted with GI bleeding, thought to be AVMs, in 2/18 and warfarin was stopped.  She was admitted in 3/18 with chest pain, dyspnea, and elevated troponin.  She had not been taking her Lasix regularly.  Coronary angiography showed nonobstructive CAD, suspect demand ischemia. RHC showed elevated right and left heart filling pressures and severe pulmonary hypertension.  V/Q scan showed no chronic PE, and PFTs showed severe obstructive lung disease.  Echo showed severe RV dilation with mildly decreased systolic function.  She was diuresed and eventually discharged to [facility].   She was admitted in 2/19 with anemia, got 2 units PRBCs.    Echo in 5/19 with EF 60-65%, mildly decreased RV systolic function, PASP 49 mmHg, normal bioprosthetic mitral valve.   She returns for followup of diastolic CHF.  She has home oxygen.  She uses her CPAP.  Still smoking, failed Chantix in the past.   She has been more short of breath recently, dyspneic just walking around the house.  She has been using a walker.  She cut back her Lasix to 80 mg daily on her own over a month ago.  She thinks this is why she is more short of breath.  No chest pain.  Still with chronic low back pain.  Very fatigued in general.  No BRBPR/melena.    ECG (personally reviewed): NSR, 1st degree AVB, left axis deviation, iRBBB.  REDS vest reading 47%  Labs (3/18): K 4.8 => 3.9, creatinine 1.2 => 1.09, hgb 10.2 => 10.7, BNP 131 Labs (5/18): K 4, creatinine 1.0 Labs (2/19): K 4.5, creatinine 0.94, transferrin saturation 3%.  Labs (3/19): hgb 11.3 Labs (4/19): K 4.1, creatinine 1.0, LDL 36 Labs (1/10): creatinine 0.85 Labs (7/20): hgb 10.7  Labs (10/20): K 4, creatinine 1.13, LDL 48 Labs  (11/20): hgb 9.2  PMH: 1. Mitral stenosis: s/p bioprosthetic mitral valve replacement.  2. COPD: Severe by 3/18 PFTs.  She is still smoking.  3. Atrial fibrillation: Paroxysmal. Has been off warfarin since GI bleed in 2/18.  4. OSA: Uses CPAP.  5. Type II diabetes. 6. Pulmonary hypertension/RV failure: Suspect primarily group 3 PH due to underlying lung disease (COPD).  - RHC (3/18) with mean RA 14, PA 77/28 mean 47, mean PCWP 23, CI 2.6, PVR 4.9.  - Echo (3/18): EF 60-65%, severe RV dilation with mildly decreased RV systolic function, PASP 76 mmHg.  Bioprosthetic MV looks ok.  - V/Q scan (3/18): No evidence for chronic PE.  - PFTs (3/18): Severe obstructive airways disease.  - Echo in 5/19 with EF 60-65%, mildly decreased RV systolic function, PASP 49 mmHg, normal bioprosthetic mitral valve.  7. Anemia: Fe deficiency and anemia of chronic disease.   - Suspect GI AVMs as cause of 2/18 bleed . Coumadin stopped.  8. CAD: LHC (3/18) with 30% LM, 40% proximal RCA.  9. Hyperlipidemia  SH: Lives alone in [place].  Active smoker.  No ETOH.   Family History  Problem Relation Age of Onset  . Peptic Ulcer Disease Father   . Heart attack Father 61       Died of MI at age 6  . Heart attack Brother 35       Died of MI at age 76  . Obesity Brother   . Pneumonia Mother   .  Healthy Sister   . Lupus Daughter   . Obsessive Compulsive Disorder Daughter    ROS: All systems reviewed and negative except as per HPI.   Current Outpatient Medications  Medication Sig Dispense Refill  . albuterol (PROAIR HFA) 108 (90 Base) MCG/ACT inhaler Inhale 2 puffs into the lungs every 6 (six) hours as needed for shortness of breath. 18 g 5  . ALPRAZolam (XANAX) 1 MG tablet TAKE 1 TABLET BY MOUTH EVERY NIGHT AT BEDTIME AS NEEDED FOR INSOMNIA. MAY TAKE 1/2 TABLET BY MOUTH DURING THE DAY FOR ANXIETY 55 tablet 4  . benzonatate (TESSALON) 100 MG capsule Take 1 capsule (100 mg total) by mouth 3 (three) times daily  as needed for cough. 90 capsule 11  . Blood Glucose Monitoring Suppl (ONETOUCH VERIO) W/DEVICE KIT 1 each by Does not apply route 4 (four) times daily. 1 kit 0  . buPROPion (WELLBUTRIN) 75 MG tablet Take 1 tablet (75 mg total) by mouth 2 (two) times daily. 180 tablet 3  . chlorpheniramine (CHLOR-TRIMETON) 4 MG tablet Take 4 mg by mouth daily as needed for allergies.    . clobetasol cream (TEMOVATE) 7.00 % Apply 1 application topically 2 (two) times daily as needed (lichen sclerosus flare). 45 g 3  . DULoxetine (CYMBALTA) 60 MG capsule TAKE 1 CAPSULE BY MOUTH 2 TIMES A DAY 180 capsule 3  . ferrous gluconate (FERGON) 324 MG tablet Take 1 tablet (324 mg total) by mouth 2 (two) times daily with a meal. (Patient taking differently: Take 648 mg by mouth See admin instructions. Take 2 tablets (648 mg) by mouth daily with lunch or supper) 180 tablet 3  . fluticasone (FLONASE) 50 MCG/ACT nasal spray Place 2 sprays into both nostrils daily. 16 g 11  . Fluticasone-Salmeterol (ADVAIR) 500-50 MCG/DOSE AEPB INHALE 1 PUFF INTO THE LUNGS TWICE DAILY 180 each 3  . furosemide (LASIX) 80 MG tablet TAKE 1 TABLET BY MOUTH EVERY MORNING AND TAKE 1/2 TABLET EVERY EVENING 45 tablet 2  . gabapentin (NEURONTIN) 300 MG capsule Take 2 capsules (600 mg total) by mouth 3 (three) times daily. 540 capsule 3  . glucose blood (ONETOUCH VERIO) test strip Use to check blood sugar 4 times daily. diag code E11.40. Insulin dependent 375 each 3  . HYDROcodone-acetaminophen (NORCO/VICODIN) 5-325 MG tablet Take 2 tablets by mouth every 6 (six) hours as needed for severe pain. 114 tablet 0  . hydrocortisone cream 1 % APPLY EXTERNALLY TO THE AFFECTED AREA TWICE DAILY (Patient taking differently: APPLY EXTERNALLY TO THE AFFECTED AREA TWICE DAILY prn) 28.35 g 2  . Insulin Pen Needle 32G X 4 MM MISC Use to inject insulin up to 6 times a day 500 each 3  . Insulin Syringe-Needle U-100 31G X 15/64" 0.5 ML MISC Use to inject insulin up to 4 times a  day 400 each 3  . ipratropium-albuterol (DUONEB) 0.5-2.5 (3) MG/3ML SOLN INHALE THE CONTENTS OF 1 VIAL VIA NEBULIZER EVERY 6 HOURS AS NEEDED FOR SHORTNESS OF BREATH 360 mL 3  . Lancets Misc. (ACCU-CHEK FASTCLIX LANCET) KIT Check your blood 4 times a day dx code 250.00 insulin requiring 1 kit 2  . metoprolol succinate (TOPROL-XL) 50 MG 24 hr tablet TAKE 1 TABLET BY MOUTH DAILY 90 tablet 3  . nicotine (NICODERM CQ - DOSED IN MG/24 HOURS) 14 mg/24hr patch Place 1 patch (14 mg total) onto the skin daily. 28 patch 0  . NOVOLOG FLEXPEN 100 UNIT/ML FlexPen INJECT 15 UNITS TO 25 UNITS SUBCUTANEOUSLY FOUR  TIMES A DAY BEFORE MEALS AND AT BEDTIME 15 mL 5  . nystatin cream (MYCOSTATIN) Apply 1 application topically 2 (two) times daily as needed (rash).   3  . omeprazole (PRILOSEC) 40 MG capsule Take 1 capsule (40 mg total) by mouth 2 (two) times daily. 180 capsule 3  . ondansetron (ZOFRAN) 4 MG tablet TAKE 1 TABLET BY MOUTH EVERY 8 HOURS AS NEEDED FOR NAUSEA OR VOMITING 20 tablet 2  . OXYGEN Inhale 2 L into the lungs as needed (for shortness of breath while active).     . potassium chloride SA (KLOR-CON) 20 MEQ tablet TAKE 2 TABLETS BY MOUTH EVERY MORNING AND TAKE 1 TABLET BY MOUTH EVERY EVENING 90 tablet 1  . PRESCRIPTION MEDICATION Inhale into the lungs at bedtime. CPAP    . rosuvastatin (CRESTOR) 20 MG tablet Take 1 tablet (20 mg total) by mouth at bedtime. 90 tablet 3  . Tiotropium Bromide Monohydrate (SPIRIVA RESPIMAT) 2.5 MCG/ACT AERS Inhale 2 puffs into the lungs daily. 1 Inhaler 11  . VICTOZA 18 MG/3ML SOPN INJECT 1.8MG SUBCUTANEOUSLY DAILY 18 mL 3  . warfarin (COUMADIN) 2.5 MG tablet 2.31m warfarin every day except on Mondays - take NO TABLETS and on Tuesdays take ONLY 1/2 pill 90 tablet 1   Current Facility-Administered Medications  Medication Dose Route Frequency Provider Last Rate Last Admin  . senna-docusate (Senokot-S) tablet 1 tablet  1 tablet Oral QHS PRN Hoffman, Jessica Ratliff, DO        BP (!) 124/38   Pulse 76   Wt 82.1 kg (181 lb)   SpO2 98% Comment: 2L of oxygen  BMI 34.20 kg/m  General: NAD Neck: JVP difficult (?increased), no thyromegaly or thyroid nodule.  Lungs: Clear to auscultation bilaterally with normal respiratory effort. CV: Nondisplaced PMI.  Heart regular S1/S2, no S3/S4, no murmur.  Trace edema.  No carotid bruit.  Normal pedal pulses.  Abdomen: Soft, nontender, no hepatosplenomegaly, no distention.  Skin: Intact without lesions or rashes.  Neurologic: Alert and oriented x 3.  Psych: Normal affect. Extremities: No clubbing or cyanosis.  HEENT: Normal.   Assessment/Plan: 1. Pulmonary hypertension: I suspect that this is primarily group 2 PH (pulmonary venous hypertension from LV diastolic dysfunction) and group 3 PH (severe COPD, OSA).  V/Q scan showed no evidence for chronic PE.  I do not think that she will benefit from selective pulmonary vasodilators.  I think that the key here will continue to be good diuresis + use of oxygen and CPAP.  2. Chronic diastolic CHF with prominent RV dysfunction in the setting of severe COPD.  Volume status is difficult by exam but she looks like she is volume overloaded, REDS clip reading of 47% is very suggestive as well.  NYHA class III symptoms (dyspnea is likely a combination of CHF and COPD), worse recently. Of note, she cut back on home Lasix on her own.  - Increase Lasix to 80 mg bid x 2 days then 80 qam/40 qpm after that.  BMET today and again in 10 days.  3. Atrial fibrillation: Paroxysmal.  She is in NSR today.  She had been off anticoagulation due to GI bleeding, now back on warfarin with INR goal 1.5-2.5.  She had GI bleeding in 2/19, no recent melena.   - Keep warfarin with INR 1.5-2.5 for now.  - CBC today. - With most recent data regarding NOACs and bioprosthetic valves, I suggested in the past that she switch to apixaban given GI bleeding history. She  has wanted to stay on warfarin.  4. h/o GI bleeding:  Possible GI AVM. Admitted with anemia again in 2/19, got 2 units PRBCs.  No overt bleeding now.  - CBC today.  5. COPD: Severe by PFTs. Unfortunately, she is still smoking.  I encouraged her to quit again.  6. CAD: Nonobstructive. She is on a statin.  - Good lipids in 10/20.   Followup with [name] in 1 month.   Loralie Champagne 02/17/2019

## 2019-02-17 NOTE — Progress Notes (Signed)
ReDS Vest / Clip - 02/17/19 1000      ReDS Vest / Clip   Station Marker  B    Ruler Value  40.5    ReDS Value Range  (!) High volume overload    ReDS Actual Value  47    Anatomical Comments  sitting

## 2019-02-18 ENCOUNTER — Ambulatory Visit: Payer: Self-pay

## 2019-02-19 ENCOUNTER — Telehealth: Payer: Self-pay | Admitting: Internal Medicine

## 2019-02-19 NOTE — Telephone Encounter (Signed)
  Reason for call:   I placed an outgoing call to [name] at 7:30 PM regarding her warfarin medication after eating collard greens.   Assessment/ Plan:   This is a 69 year old female with a history of COPD, mitral stenosis s/p bioprostetic MV replacement, PAF, GI bleeding, pulmonary HTN/RV failure who is on warfarin and monitors her INR weekly. She reports that today she ate some collard greens because her sister brought them over, she normally does not eat collard greens. She was wondering if she needs to adjust her warfarin dose, she is scheduled to take 1.25 mg today. She denies any symptoms of bleeding or clots. Her last INR on 1/4 was 1.9, goal of 1.5-2.5. 1/2 cup of collard greens contains around 385 mcg of vitamin k, daily recommended value is 90 mcg. Foods high in vitamin K can decrease anticoagulant effect if taken with warfarin but since she did not take her warfarin with the collard greens it is unclear if this will effect it. I advised her to continue her current dose of warfarin. Her next INR check is on 1/11. Advised her to continue to monitor for symptoms of blood clots.   As always, pt is advised that if symptoms worsen or new symptoms arise, they should go to an urgent care facility or to to ER for further evaluation.   Kathryn Noble, MD   02/19/2019, 7:42 PM

## 2019-02-23 ENCOUNTER — Telehealth: Payer: Self-pay | Admitting: Internal Medicine

## 2019-02-23 NOTE — Telephone Encounter (Signed)
Pls contact for VO [phone] [name]

## 2019-02-23 NOTE — Telephone Encounter (Signed)
[facility] calls and ask to extend to 2 more visits to do walker training, VO given, do you agree?

## 2019-02-23 NOTE — Telephone Encounter (Signed)
Agree Thanks

## 2019-02-28 ENCOUNTER — Other Ambulatory Visit: Payer: Self-pay

## 2019-02-28 ENCOUNTER — Other Ambulatory Visit (HOSPITAL_COMMUNITY): Payer: Self-pay

## 2019-02-28 ENCOUNTER — Ambulatory Visit (HOSPITAL_COMMUNITY)
Admission: RE | Admit: 2019-02-28 | Discharge: 2019-02-28 | Disposition: A | Discharge status: Home / Self Care | Payer: HMO | Source: Ambulatory Visit | Attending: Cardiology | Admitting: Cardiology

## 2019-02-28 DIAGNOSIS — D649 Anemia, unspecified: Secondary | ICD-10-CM | POA: Diagnosis not present

## 2019-02-28 DIAGNOSIS — I5032 Chronic diastolic (congestive) heart failure: Secondary | ICD-10-CM | POA: Diagnosis not present

## 2019-02-28 LAB — BASIC METABOLIC PANEL
Anion gap: 10 (ref 5–15)
BUN: 18 mg/dL (ref 8–23)
CO2: 25 mmol/L (ref 22–32)
Calcium: 9 mg/dL (ref 8.9–10.3)
Chloride: 99 mmol/L (ref 98–111)
Creatinine, Ser: 1.28 mg/dL — ABNORMAL HIGH (ref 0.44–1.00)
GFR calc Af Amer: 49 mL/min — ABNORMAL LOW (ref >60)
GFR calc non Af Amer: 43 mL/min — ABNORMAL LOW (ref >60)
Glucose, Bld: 163 mg/dL — ABNORMAL HIGH (ref 70–99)
Potassium: 4.2 mmol/L (ref 3.5–5.1)
Sodium: 134 mmol/L — ABNORMAL LOW (ref 135–145)

## 2019-02-28 LAB — CBC
HCT: 28.9 % — ABNORMAL LOW (ref 36.0–46.0)
Hemoglobin: 7.9 g/dL — ABNORMAL LOW (ref 12.0–15.0)
MCH: 21.4 pg — ABNORMAL LOW (ref 26.0–34.0)
MCHC: 27.3 g/dL — ABNORMAL LOW (ref 30.0–36.0)
MCV: 78.1 fL — ABNORMAL LOW (ref 80.0–100.0)
Platelets: 254 10*3/uL (ref 150–400)
RBC: 3.7 MIL/uL — ABNORMAL LOW (ref 3.87–5.11)
RDW: 19.3 % — ABNORMAL HIGH (ref 11.5–15.5)
WBC: 8.2 10*3/uL (ref 4.0–10.5)
nRBC: 0.2 % (ref 0.0–0.2)

## 2019-02-28 LAB — IRON AND TIBC
Iron: 26 ug/dL — ABNORMAL LOW (ref 28–170)
Saturation Ratios: 5 % — ABNORMAL LOW (ref 10.4–31.8)
TIBC: 511 ug/dL — ABNORMAL HIGH (ref 250–450)
UIBC: 485 ug/dL

## 2019-02-28 LAB — FERRITIN: Ferritin: 4 ng/mL — ABNORMAL LOW (ref 11–307)

## 2019-02-28 NOTE — Progress Notes (Unsigned)
LM for patient to return call as MD ordered for patient to receive iron infusions x2 doses

## 2019-03-01 ENCOUNTER — Other Ambulatory Visit (HOSPITAL_COMMUNITY): Payer: Self-pay

## 2019-03-01 ENCOUNTER — Telehealth: Payer: Self-pay | Admitting: *Deleted

## 2019-03-01 ENCOUNTER — Telehealth: Payer: Self-pay | Admitting: Pharmacist

## 2019-03-01 ENCOUNTER — Telehealth (HOSPITAL_COMMUNITY): Payer: Self-pay

## 2019-03-01 DIAGNOSIS — D649 Anemia, unspecified: Secondary | ICD-10-CM

## 2019-03-01 DIAGNOSIS — E611 Iron deficiency: Secondary | ICD-10-CM

## 2019-03-01 MED ORDER — SODIUM CHLORIDE 0.9 % IV SOLN: 510.0000 mg | INTRAVENOUS | Status: DC

## 2019-03-01 NOTE — Addendum Note (Signed)
Addended by: Valeda Malm on: 03/01/2019 01:36 PM   Modules accepted: Orders

## 2019-03-01 NOTE — Telephone Encounter (Signed)
Received fax from mdINR/INR self-testing service -  INR 1.8 done on 03/01/19 Thanks

## 2019-03-01 NOTE — Telephone Encounter (Signed)
Pt aware of results and recommendations via [place].  Pt scheduled for first dose of feraheme tomorrow in Medical Day.  Pt amenable to day and time.

## 2019-03-01 NOTE — Telephone Encounter (Signed)
Patient texted me results of her FS POC PST INR = 1.8 (target range 1.5 - 2.5) on 2.16m Tu-Fri; 1.2732mon Saturdays; 2.32m52mn Sundays;  0mg79m Mondays. Was instructed to CONTINUE this regimen. Perform PST FS POC INR in 1 week. Denies bleeding. No signs of chest-pain,etc. Adequate supply of warfarin on-hand.

## 2019-03-01 NOTE — Telephone Encounter (Signed)
-----  Message from [name], MD sent at 02/28/2019  2:05 PM EST ----- Low iron, arrange for feraheme injection x 2.

## 2019-03-02 ENCOUNTER — Other Ambulatory Visit: Payer: Self-pay

## 2019-03-02 ENCOUNTER — Ambulatory Visit: Payer: Self-pay

## 2019-03-02 ENCOUNTER — Ambulatory Visit (HOSPITAL_COMMUNITY)
Admission: RE | Admit: 2019-03-02 | Discharge: 2019-03-02 | Disposition: A | Discharge status: Home / Self Care | Payer: HMO | Source: Ambulatory Visit | Attending: Cardiology | Admitting: Cardiology

## 2019-03-02 DIAGNOSIS — E611 Iron deficiency: Secondary | ICD-10-CM | POA: Insufficient documentation

## 2019-03-02 MED ORDER — SODIUM CHLORIDE 0.9 % IV SOLN
510.0000 mg | INTRAVENOUS | Status: DC
  Administered 2019-03-02: 510 mg via INTRAVENOUS
  Filled 2019-03-02: qty 510

## 2019-03-02 NOTE — Patient Outreach (Signed)
[facility]) Care Management  03/02/2019  [name] 1950/04/21 [id number]   Unsuccessful outreach to patient to ensure receipt of resources mailed on 02/16/19.  Left voicemail message.  Will attempt to reach again within four business days if no response to message.  Ronn Melena, BSW Social Worker [phone]

## 2019-03-04 ENCOUNTER — Ambulatory Visit: Payer: Self-pay

## 2019-03-04 ENCOUNTER — Other Ambulatory Visit: Payer: Self-pay

## 2019-03-04 NOTE — Patient Outreach (Addendum)
[facility]) Care Management  03/04/2019  [name] [birth year] [id number]   Second unsuccessful outreach to patient to ensure receipt of resources mailed on 02/16/19.  Left voicemail message.  Will make final attempt to reach within four business days if no response to message.   Addendum: Return call from patient.  She is unsure if medical alert resources have been received as she has not been through all of her mail.  Informed patient that social work case is being closed but encouraged her to call if she did not receive resources or additional needs arise.   Ronn Melena, BSW Social Worker [phone]

## 2019-03-07 ENCOUNTER — Telehealth: Payer: Self-pay | Admitting: Internal Medicine

## 2019-03-07 ENCOUNTER — Ambulatory Visit: Payer: Self-pay

## 2019-03-07 ENCOUNTER — Telehealth: Payer: Self-pay | Admitting: Pharmacist

## 2019-03-07 NOTE — Telephone Encounter (Signed)
Patient called with PST FS POC INR 2.30 (goal 1.5 - 2.5). Will CONTINUE same regimen of 1x2.23m warfarin on Tu/We/Th/Fri/Sun; 1/2 x 2.51m(1.2567mon Saturdays. NO warfarin on Mondays. Repeat INR in 7 days.

## 2019-03-07 NOTE — Telephone Encounter (Signed)
Pt requesting a call back.

## 2019-03-07 NOTE — Telephone Encounter (Signed)
I have no objection to her getting a Covid vaccine.

## 2019-03-07 NOTE — Telephone Encounter (Signed)
There are NO absolute contraindications for patients on warfarin to receive the COVID-19 vaccine (either one). Like with any IM injection, the patient may experience perhaps more bleeding from the IM injection--but no more than from any other needle having been injected intramuscularly.

## 2019-03-07 NOTE — Telephone Encounter (Signed)
Are you and dr groce good w/ pt getting the COVID vaccine?

## 2019-03-07 NOTE — Telephone Encounter (Signed)
Pt has now stated she has chest pain, describes "as little twinges" for the past 24 hrs at least, stated she was worried. Then when ask to call 911 or go to ED she stated they were better and she didn't want to do that. She has been encouraged several times and continues to refuse.

## 2019-03-08 ENCOUNTER — Other Ambulatory Visit (HOSPITAL_COMMUNITY): Payer: Self-pay | Admitting: *Deleted

## 2019-03-09 ENCOUNTER — Encounter (HOSPITAL_COMMUNITY)
Admission: RE | Admit: 2019-03-09 | Discharge: 2019-03-09 | Disposition: A | Discharge status: Home / Self Care | Payer: HMO | Source: Ambulatory Visit | Attending: Cardiology | Admitting: Cardiology

## 2019-03-09 ENCOUNTER — Other Ambulatory Visit: Payer: Self-pay

## 2019-03-09 DIAGNOSIS — E611 Iron deficiency: Secondary | ICD-10-CM | POA: Diagnosis not present

## 2019-03-09 DIAGNOSIS — D509 Iron deficiency anemia, unspecified: Secondary | ICD-10-CM | POA: Diagnosis not present

## 2019-03-09 MED ORDER — SODIUM CHLORIDE 0.9 % IV SOLN: 510.0000 mg | Freq: Once | INTRAVENOUS | Status: DC

## 2019-03-09 MED ORDER — SODIUM CHLORIDE 0.9 % IV SOLN
510.0000 mg | INTRAVENOUS | Status: DC
  Administered 2019-03-09: 510 mg via INTRAVENOUS
  Filled 2019-03-09: qty 17

## 2019-03-14 ENCOUNTER — Telehealth: Payer: Self-pay | Admitting: Pharmacist

## 2019-03-14 NOTE — Telephone Encounter (Signed)
Patient texted me at [phone] with PST FS POC INR 2.2 (goal 1.5 - 2.5), on 1 x 2.19m Tu/We/Th/Fr and Su; 1/2 x 2.5 (1.267mdose) on Saturdays; NO warfarin on Mondays. Advised to CONTINUE this regimen. Repeat PST FS POC INR 8-FEB-21. No indication of bleeding, has adequate supply of warfarin.

## 2019-03-17 ENCOUNTER — Encounter: Payer: HMO | Admitting: Internal Medicine

## 2019-03-21 ENCOUNTER — Ambulatory Visit (HOSPITAL_COMMUNITY)
Admission: RE | Admit: 2019-03-21 | Discharge: 2019-03-21 | Disposition: A | Discharge status: Home / Self Care | Payer: HMO | Source: Ambulatory Visit | Attending: Adult Health | Admitting: Adult Health

## 2019-03-21 ENCOUNTER — Other Ambulatory Visit: Payer: Self-pay

## 2019-03-21 ENCOUNTER — Telehealth: Payer: Self-pay | Admitting: Pharmacist

## 2019-03-21 ENCOUNTER — Encounter (HOSPITAL_COMMUNITY): Payer: Self-pay

## 2019-03-21 VITALS — BP 136/65 | HR 71 | Wt 177.6 lb

## 2019-03-21 DIAGNOSIS — D638 Anemia in other chronic diseases classified elsewhere: Secondary | ICD-10-CM | POA: Diagnosis not present

## 2019-03-21 DIAGNOSIS — J449 Chronic obstructive pulmonary disease, unspecified: Secondary | ICD-10-CM | POA: Insufficient documentation

## 2019-03-21 DIAGNOSIS — R5383 Other fatigue: Secondary | ICD-10-CM | POA: Diagnosis not present

## 2019-03-21 DIAGNOSIS — D649 Anemia, unspecified: Secondary | ICD-10-CM | POA: Diagnosis not present

## 2019-03-21 DIAGNOSIS — E785 Hyperlipidemia, unspecified: Secondary | ICD-10-CM | POA: Insufficient documentation

## 2019-03-21 DIAGNOSIS — Z7901 Long term (current) use of anticoagulants: Secondary | ICD-10-CM | POA: Diagnosis not present

## 2019-03-21 DIAGNOSIS — I2722 Pulmonary hypertension due to left heart disease: Secondary | ICD-10-CM | POA: Diagnosis not present

## 2019-03-21 DIAGNOSIS — I272 Pulmonary hypertension, unspecified: Secondary | ICD-10-CM | POA: Diagnosis not present

## 2019-03-21 DIAGNOSIS — G4733 Obstructive sleep apnea (adult) (pediatric): Secondary | ICD-10-CM | POA: Diagnosis not present

## 2019-03-21 DIAGNOSIS — Z953 Presence of xenogenic heart valve: Secondary | ICD-10-CM | POA: Insufficient documentation

## 2019-03-21 DIAGNOSIS — E119 Type 2 diabetes mellitus without complications: Secondary | ICD-10-CM | POA: Diagnosis not present

## 2019-03-21 DIAGNOSIS — I2510 Atherosclerotic heart disease of native coronary artery without angina pectoris: Secondary | ICD-10-CM | POA: Insufficient documentation

## 2019-03-21 DIAGNOSIS — I480 Paroxysmal atrial fibrillation: Secondary | ICD-10-CM | POA: Diagnosis not present

## 2019-03-21 DIAGNOSIS — F17200 Nicotine dependence, unspecified, uncomplicated: Secondary | ICD-10-CM

## 2019-03-21 DIAGNOSIS — Z79899 Other long term (current) drug therapy: Secondary | ICD-10-CM | POA: Insufficient documentation

## 2019-03-21 DIAGNOSIS — I5032 Chronic diastolic (congestive) heart failure: Secondary | ICD-10-CM

## 2019-03-21 DIAGNOSIS — Z8249 Family history of ischemic heart disease and other diseases of the circulatory system: Secondary | ICD-10-CM | POA: Insufficient documentation

## 2019-03-21 DIAGNOSIS — Z794 Long term (current) use of insulin: Secondary | ICD-10-CM | POA: Insufficient documentation

## 2019-03-21 DIAGNOSIS — R0602 Shortness of breath: Secondary | ICD-10-CM | POA: Insufficient documentation

## 2019-03-21 LAB — CBC
HCT: 36.4 % (ref 36.0–46.0)
Hemoglobin: 10.7 g/dL — ABNORMAL LOW (ref 12.0–15.0)
MCH: 26.3 pg (ref 26.0–34.0)
MCHC: 29.4 g/dL — ABNORMAL LOW (ref 30.0–36.0)
MCV: 89.4 fL (ref 80.0–100.0)
Platelets: 311 10*3/uL (ref 150–400)
RBC: 4.07 MIL/uL (ref 3.87–5.11)
RDW: 28.4 % — ABNORMAL HIGH (ref 11.5–15.5)
WBC: 7.2 10*3/uL (ref 4.0–10.5)
nRBC: 0 % (ref 0.0–0.2)

## 2019-03-21 NOTE — Patient Instructions (Addendum)
Lab work done today. We will notify you of any abnormal lab work. No news is good news!  Please follow up with the [facility] in 6-8 weeks.  At the [facility], you and your health needs are our priority. As part of our continuing mission to provide you with exceptional heart care, we have created designated Provider Care Teams. These Care Teams include your primary Cardiologist (physician) and Advanced Practice Providers (APPs- Physician Assistants and Nurse Practitioners) who all work together to provide you with the care you need, when you need it.   You may see any of the following providers on your designated Care Team at your next follow up: Marland Kitchen Dr Glori Bickers . Dr Loralie Champagne . Darrick Grinder, NP . Lyda Jester, PA . Audry Riles, PharmD   Please be sure to bring in all your medications bottles to every appointment.

## 2019-03-21 NOTE — Progress Notes (Signed)
PCP: Bartholomew Crews, MD HF Cardiology: Dr. Aundra Dubin  69 y.o. with COPD, mitral stenosis s/p bioprosthetic MV replacement, paroxysmal atrial fibrillation, GI bleeding, and pulmonary hypertension/RV failure presents for cardiology followup.  She was admitted with GI bleeding, thought to be AVMs, in 2/18 and warfarin was stopped.  She was admitted in 3/18 with chest pain, dyspnea, and elevated troponin.  She had not been taking her Lasix regularly.  Coronary angiography showed nonobstructive CAD, suspect demand ischemia. RHC showed elevated right and left heart filling pressures and severe pulmonary hypertension.  V/Q scan showed no chronic PE, and PFTs showed severe obstructive lung disease.  Echo showed severe RV dilation with mildly decreased systolic function.  She was diuresed and eventually discharged to [facility].   She was admitted in 2/19 with anemia, got 2 units PRBCs.    Echo in 5/19 with EF 60-65%, mildly decreased RV systolic function, PASP 49 mmHg, normal bioprosthetic mitral valve.   Received  2 doses Feraheme in January. Hgb at that time was 7.9 Iron sats 5%   Today she returns for HF follow up. Overall feeling fair. Only leaves the house for medical appointments. Complaining of fatigue. Remains SOB with exertion. Denies PND/Orthopnea. Using home oxygen + CPAP.   Appetite ok. No fever or chills. Weight at home 174  pounds. Taking all medications. Smoking throughout the day.   Labs (3/18): K 4.8 => 3.9, creatinine 1.2 => 1.09, hgb 10.2 => 10.7, BNP 131 Labs (5/18): K 4, creatinine 1.0 Labs (2/19): K 4.5, creatinine 0.94, transferrin saturation 3%.  Labs (3/19): hgb 11.3 Labs (4/19): K 4.1, creatinine 1.0, LDL 36 Labs (1/10): creatinine 0.85 Labs (7/20): hgb 10.7  Labs (10/20): K 4, creatinine 1.13, LDL 48 Labs (11/20): hgb 9.2  PMH: 1. Mitral stenosis: s/p bioprosthetic mitral valve replacement.  2. COPD: Severe by 3/18 PFTs.  She is still smoking.  3. Atrial  fibrillation: Paroxysmal. Has been off warfarin since GI bleed in 2/18.  4. OSA: Uses CPAP.  5. Type II diabetes. 6. Pulmonary hypertension/RV failure: Suspect primarily group 3 PH due to underlying lung disease (COPD).  - RHC (3/18) with mean RA 14, PA 77/28 mean 47, mean PCWP 23, CI 2.6, PVR 4.9.  - Echo (3/18): EF 60-65%, severe RV dilation with mildly decreased RV systolic function, PASP 76 mmHg.  Bioprosthetic MV looks ok.  - V/Q scan (3/18): No evidence for chronic PE.  - PFTs (3/18): Severe obstructive airways disease.  - Echo in 5/19 with EF 60-65%, mildly decreased RV systolic function, PASP 49 mmHg, normal bioprosthetic mitral valve.  7. Anemia: Fe deficiency and anemia of chronic disease.   - Suspect GI AVMs as cause of 2/18 bleed . Coumadin stopped.  8. CAD: LHC (3/18) with 30% LM, 40% proximal RCA.  9. Hyperlipidemia  SH: Lives alone in [place].  Active smoker.  No ETOH.   Family History  Problem Relation Age of Onset  . Peptic Ulcer Disease Father   . Heart attack Father 52       Died of MI at age 107  . Heart attack Brother 61       Died of MI at age 64  . Obesity Brother   . Pneumonia Mother   . Healthy Sister   . Lupus Daughter   . Obsessive Compulsive Disorder Daughter    ROS: All systems reviewed and negative except as per HPI.   Current Outpatient Medications  Medication Sig Dispense Refill  . albuterol (PROAIR  HFA) 108 (90 Base) MCG/ACT inhaler Inhale 2 puffs into the lungs every 6 (six) hours as needed for shortness of breath. 18 g 5  . ALPRAZolam (XANAX) 1 MG tablet TAKE 1 TABLET BY MOUTH EVERY NIGHT AT BEDTIME AS NEEDED FOR INSOMNIA. MAY TAKE 1/2 TABLET BY MOUTH DURING THE DAY FOR ANXIETY 55 tablet 4  . benzonatate (TESSALON) 100 MG capsule Take 1 capsule (100 mg total) by mouth 3 (three) times daily as needed for cough. 90 capsule 11  . Blood Glucose Monitoring Suppl (ONETOUCH VERIO) W/DEVICE KIT 1 each by Does not apply route 4 (four) times daily. 1 kit  0  . buPROPion (WELLBUTRIN) 75 MG tablet Take 1 tablet (75 mg total) by mouth 2 (two) times daily. 180 tablet 3  . chlorpheniramine (CHLOR-TRIMETON) 4 MG tablet Take 4 mg by mouth daily as needed for allergies.    . clobetasol cream (TEMOVATE) 3.55 % Apply 1 application topically 2 (two) times daily as needed (lichen sclerosus flare). 45 g 3  . DULoxetine (CYMBALTA) 60 MG capsule TAKE 1 CAPSULE BY MOUTH 2 TIMES A DAY 180 capsule 3  . ferrous gluconate (FERGON) 324 MG tablet Take 1 tablet (324 mg total) by mouth 2 (two) times daily with a meal. (Patient taking differently: Take 648 mg by mouth See admin instructions. Take 2 tablets (648 mg) by mouth daily with lunch or supper) 180 tablet 3  . fluticasone (FLONASE) 50 MCG/ACT nasal spray Place 2 sprays into both nostrils daily. 16 g 11  . Fluticasone-Salmeterol (ADVAIR) 500-50 MCG/DOSE AEPB INHALE 1 PUFF INTO THE LUNGS TWICE DAILY 180 each 3  . furosemide (LASIX) 80 MG tablet TAKE 1 TABLET BY MOUTH EVERY MORNING AND TAKE 1/2 TABLET EVERY EVENING 45 tablet 2  . gabapentin (NEURONTIN) 300 MG capsule Take 2 capsules (600 mg total) by mouth 3 (three) times daily. 540 capsule 3  . glucose blood (ONETOUCH VERIO) test strip Use to check blood sugar 4 times daily. diag code E11.40. Insulin dependent 375 each 3  . HYDROcodone-acetaminophen (NORCO/VICODIN) 5-325 MG tablet Take 2 tablets by mouth every 6 (six) hours as needed for severe pain. 114 tablet 0  . hydrocortisone cream 1 % APPLY EXTERNALLY TO THE AFFECTED AREA TWICE DAILY (Patient taking differently: APPLY EXTERNALLY TO THE AFFECTED AREA TWICE DAILY prn) 28.35 g 2  . Insulin Pen Needle 32G X 4 MM MISC Use to inject insulin up to 6 times a day 500 each 3  . Insulin Syringe-Needle U-100 31G X 15/64" 0.5 ML MISC Use to inject insulin up to 4 times a day 400 each 3  . ipratropium-albuterol (DUONEB) 0.5-2.5 (3) MG/3ML SOLN INHALE THE CONTENTS OF 1 VIAL VIA NEBULIZER EVERY 6 HOURS AS NEEDED FOR SHORTNESS OF  BREATH 360 mL 3  . Lancets Misc. (ACCU-CHEK FASTCLIX LANCET) KIT Check your blood 4 times a day dx code 250.00 insulin requiring 1 kit 2  . metoprolol succinate (TOPROL-XL) 50 MG 24 hr tablet TAKE 1 TABLET BY MOUTH DAILY 90 tablet 3  . NOVOLOG FLEXPEN 100 UNIT/ML FlexPen INJECT 15 UNITS TO 25 UNITS SUBCUTANEOUSLY FOUR TIMES A DAY BEFORE MEALS AND AT BEDTIME 15 mL 5  . nystatin cream (MYCOSTATIN) Apply 1 application topically 2 (two) times daily as needed (rash).   3  . omeprazole (PRILOSEC) 40 MG capsule Take 1 capsule (40 mg total) by mouth 2 (two) times daily. 180 capsule 3  . ondansetron (ZOFRAN) 4 MG tablet TAKE 1 TABLET BY MOUTH EVERY 8 HOURS  AS NEEDED FOR NAUSEA OR VOMITING 20 tablet 2  . OXYGEN Inhale 2 L into the lungs as needed (for shortness of breath while active).     . potassium chloride SA (KLOR-CON) 20 MEQ tablet TAKE 2 TABLETS BY MOUTH EVERY MORNING AND TAKE 1 TABLET BY MOUTH EVERY EVENING 90 tablet 1  . PRESCRIPTION MEDICATION Inhale into the lungs at bedtime. CPAP    . rosuvastatin (CRESTOR) 20 MG tablet Take 1 tablet (20 mg total) by mouth at bedtime. 90 tablet 3  . Tiotropium Bromide Monohydrate (SPIRIVA RESPIMAT) 2.5 MCG/ACT AERS Inhale 2 puffs into the lungs daily. 1 Inhaler 11  . VICTOZA 18 MG/3ML SOPN INJECT 1.8MG SUBCUTANEOUSLY DAILY 18 mL 3  . warfarin (COUMADIN) 2.5 MG tablet 2.19m warfarin every day except on Mondays - take NO TABLETS and on Tuesdays take ONLY 1/2 pill 90 tablet 1   Current Facility-Administered Medications  Medication Dose Route Frequency Provider Last Rate Last Admin  . senna-docusate (Senokot-S) tablet 1 tablet  1 tablet Oral QHS PRN Hoffman, Jessica Ratliff, DO       BP 136/65   Pulse 71   Wt 80.6 kg (177 lb 9.6 oz)   SpO2 97%   BMI 32.48 kg/m   Wt Readings from Last 3 Encounters:  03/21/19 80.6 kg (177 lb 9.6 oz)  03/09/19 81.6 kg (180 lb)  02/17/19 82.1 kg (181 lb)   General: Sitting in the wheel chair. NAD HEENT: normal Neck: supple.  no JVD. Carotids 2+ bilat; no bruits. No lymphadenopathy or thryomegaly appreciated. Cor: PMI nondisplaced. Regular rate & rhythm. No rubs, gallops or murmurs. Lungs: LLL crackles on 2 liters [place] Abdomen: soft, nontender, nondistended. No hepatosplenomegaly. No bruits or masses. Good bowel sounds. Extremities: no cyanosis, clubbing, rash, edema Neuro: alert & orientedx3, cranial nerves grossly intact. moves all 4 extremities w/o difficulty. Affect pleasant  Assessment/Plan: 1. Pulmonary hypertension: I suspect that this is primarily group 2 PH (pulmonary venous hypertension from LV diastolic dysfunction) and group 3 PH (severe COPD, OSA).  V/Q scan showed no evidence for chronic PE.  I do not think that she will benefit from selective pulmonary vasodilators.   Continue CPAP + oxygen.  2. Chronic diastolic CHF with prominent RV dysfunction in the setting of severe COPD. NYHA III chronically. Volume status stable. Continue current dose of lasix.  - I reviewed BMET from `02/28/19 stable.  3. Atrial fibrillation: Paroxysmal.    She had been off anticoagulation due to GI bleeding, now back on warfarin with INR goal 1.5-2.5.  She had GI bleeding in 2/19, no recent melena.   - Check CBC today  - Keep warfarin with INR 1.5-2.5 for now.  - With most recent data regarding NOACs and bioprosthetic valves, Dr MAundra Dubinsuggested in the past that she switch to apixaban given GI bleeding history. She has wanted to stay on warfarin.  4. h/o GI bleeding: Possible GI AVM. Admitted with anemia again in 2/19, got 2 units PRBCs.  No overt bleeding now.  - Recent hgb 7.9 - Check CBC now.  5. COPD: Severe by PFTs. Unfortunately, she is still smoking.  I encouraged her to quit again.  6. CAD: Nonobstructive. She is on a statin. No chest pain.  - Good lipids in 10/20.  7. Tobacco Abuse Discussed smoking cessation.   Follow up in 6-8 weeks.  Christyna Letendre NP-C  03/21/2019

## 2019-03-21 NOTE — Telephone Encounter (Signed)
Patient texted me at 1137h 8FEB21 her PST POC FS INR results, 2.7 (target 1.5 - 2.5) on 13.48m warfarin/wk. DECREASED to 12.561mwarfarin/wk. Denies any bleeding/new medications, etc. Repeat INR POC PST FS INR on 15FEB21.

## 2019-03-28 ENCOUNTER — Telehealth: Payer: Self-pay | Admitting: Pharmacist

## 2019-03-28 NOTE — Telephone Encounter (Signed)
Patient texted me results of her PST POC FS INR today at 1145h = 1.6 (target goal 1.5 - 2.5), on 12.46m warfarin per week.INCREASED to 13.771mwarfarin/wk as:  2.36m63mn Su/Tu/We/Th/Fri; 1.236m29m Sa; 0mg 12mMondays. Repeat PST in 1 wk. No bleeding.

## 2019-03-30 ENCOUNTER — Telehealth: Payer: Self-pay | Admitting: Dietician

## 2019-03-30 NOTE — Telephone Encounter (Signed)
Supportive call. reviewed per patient request where to give injections. Mailed her a handout about where subsutaneous injection can be done. She reports injecting victoza daily and 20 units Novolog 4 times a day. She reports her blood sugars are usually less than 200, but some days always above 276m/dl.  Does not think she has low blood sugar, is still thinking about a Continuous glucose monitor.  Sleeping too much and then not enough. Agrees to an appointment with ADessie Coma Call transferred.

## 2019-04-04 ENCOUNTER — Telehealth: Payer: Self-pay | Admitting: Pharmacist

## 2019-04-04 DIAGNOSIS — I480 Paroxysmal atrial fibrillation: Secondary | ICD-10-CM | POA: Diagnosis not present

## 2019-04-04 DIAGNOSIS — Z7901 Long term (current) use of anticoagulants: Secondary | ICD-10-CM | POA: Diagnosis not present

## 2019-04-04 NOTE — Telephone Encounter (Signed)
Patient texted me results of PST FS POC INR 1.6 (target 1.5 - 2.5), on ZERO mg on MONDAYS; 1 x 2.72m Tu/We/Th/Sa/Su; 1/2 x 2.547m(1.2557mose) on Fridays. INCREASED to 2.5mg88mILY-EXCEPT on MONDAYS, take ZERO TABS on Mondays. Repeat PST FS POC INR 1MAR21. Denies any signs/symptoms of bleeding. Adequate supply of warfarin on hand she texts.

## 2019-04-05 DIAGNOSIS — L900 Lichen sclerosus et atrophicus: Secondary | ICD-10-CM | POA: Diagnosis not present

## 2019-04-05 DIAGNOSIS — N762 Acute vulvitis: Secondary | ICD-10-CM | POA: Diagnosis not present

## 2019-04-07 ENCOUNTER — Other Ambulatory Visit: Payer: Self-pay | Admitting: Dietician

## 2019-04-07 ENCOUNTER — Ambulatory Visit (INDEPENDENT_AMBULATORY_CARE_PROVIDER_SITE_OTHER): Payer: HMO | Admitting: Internal Medicine

## 2019-04-07 ENCOUNTER — Encounter: Payer: Self-pay | Admitting: Dietician

## 2019-04-07 ENCOUNTER — Ambulatory Visit (INDEPENDENT_AMBULATORY_CARE_PROVIDER_SITE_OTHER): Payer: HMO | Admitting: Dietician

## 2019-04-07 VITALS — BP 128/52 | HR 60 | Temp 98.4°F | Wt 176.8 lb

## 2019-04-07 DIAGNOSIS — R6889 Other general symptoms and signs: Secondary | ICD-10-CM

## 2019-04-07 DIAGNOSIS — N904 Leukoplakia of vulva: Secondary | ICD-10-CM | POA: Diagnosis not present

## 2019-04-07 DIAGNOSIS — E1140 Type 2 diabetes mellitus with diabetic neuropathy, unspecified: Secondary | ICD-10-CM

## 2019-04-07 DIAGNOSIS — C642 Malignant neoplasm of left kidney, except renal pelvis: Secondary | ICD-10-CM | POA: Diagnosis not present

## 2019-04-07 DIAGNOSIS — R0989 Other specified symptoms and signs involving the circulatory and respiratory systems: Secondary | ICD-10-CM

## 2019-04-07 DIAGNOSIS — I480 Paroxysmal atrial fibrillation: Secondary | ICD-10-CM

## 2019-04-07 DIAGNOSIS — Z794 Long term (current) use of insulin: Secondary | ICD-10-CM

## 2019-04-07 LAB — GLUCOSE, CAPILLARY: Glucose-Capillary: 129 mg/dL — ABNORMAL HIGH (ref 70–99)

## 2019-04-07 LAB — POCT GLYCOSYLATED HEMOGLOBIN (HGB A1C): Hemoglobin A1C: 5.4 % (ref 4.0–5.6)

## 2019-04-07 NOTE — Patient Instructions (Signed)
1. I am chiecking the circulation in your legs  2. I will research your bladder cancer  3. [name] will place a professional continuous glucose monitor.

## 2019-04-07 NOTE — Assessment & Plan Note (Signed)
Today she complains of inability to fully empty bladder.  She states she has a self toilet for 15 minutes and still cannot urinate.  She states that one time she was in the hospital and had not urinated all day and a bladder scan revealed retention and she had to have an INO cath.  She went to the toilet today but did not urinate.  A PVR showed 0 retained urine.  We then discussed her history of clear-cell renal cell carcinoma which was treated with cryoablation in 2013.  I was unable to find any notes from [name] or [name] who treated her malignancy.  I reviewed up-to-date and [facility] guideliones recommend imaging for 5 years.  Her last imaging was an MRI in 2019 which did not show any reoccurrence.  Therefore, she has completed her surveillance.  I doubt her lack of urine has to do anything with her history of renal cell carcinoma.  She is unable provide urine to get a UA today.  PLAN : Follow

## 2019-04-07 NOTE — Progress Notes (Signed)
Freestyle Libre 2 Personal CGM Training  Start time:1200    End time: 9791 Total time: 25 [place] Arminta Schriever received a sample Freestyle lbire 2 today and asks that we send a prescription top [employer] for additional sensors. She was educated about the following:  -Getting to know device    Software engineer ) -Setting up device (high alert  Used factory setting, low alert set at 100 per patient  Signal loss) -Inserting sensor ( Patient applied on her own with minimal assist) -Reviewed when to test blood sugar with meter, trend arrows, wanding/checking at least 4-6 times a day  -Ending sensor session -Trouble shooting    Patient has [employer] tech support and my contact information. Patient agrees to follow up in 1 week for cgm download 1 and again 2 weeks  Call to follow up- her CGM is working well- the alarm went off and it was 63 at 130 pm, 76 at 2, 108 at 3, 113 at 4.  Debera Lat, RD 04/07/2019 4:47 PM.

## 2019-04-07 NOTE — Progress Notes (Signed)
   Subjective:    Patient ID: [name], female    DOB: May 21, 1950, 69 y.o.   MRN: [id number]  HPI  Carrah Eppolito is here for leg shaking. Please see the A&P for the status of the pt's chronic medical problems.  ROS : per ROS section and in problem oriented charting. All other systems are negative.  PMHx, Soc hx, and / or Fam hx : Granddaughter Esme doing well.   Review of Systems  Respiratory: Positive for shortness of breath.   Musculoskeletal: Positive for arthralgias, back pain, gait problem and myalgias.  Skin: Positive for color change.       Toenails bleed       Objective:   Physical Exam Constitutional:      Appearance: Normal appearance.  HENT:     Head: Normocephalic and atraumatic.     Right Ear: External ear normal.     Left Ear: External ear normal.  Eyes:     Extraocular Movements: Extraocular movements intact.     Conjunctiva/sclera: Conjunctivae normal.  Skin:    General: Skin is warm and dry.     Comments: Her distal feet and toes are purplish and cool to the touch.  DP pulses could not be palpated but with Doppler could be detected faintly and were biphasic.  Neurological:     General: No focal deficit present.     Mental Status: She is alert.  Psychiatric:        Mood and Affect: Mood normal.        Behavior: Behavior normal.        Thought Content: Thought content normal.        Judgment: Judgment normal.           Assessment & Plan:  Billing notes Her PAD is not illness but does pose a safety threat to life or bodily function as she has poorly controlled diabetes and ongoing tobacco use that could lead to hypoperfusion of the extremity, poor wound healing, prior history of secondary infection, all of which can lead to amputation.  She is on which is a medication requiring intensive monitoring for toxicity administered by Dr. Elie Confer but supervised by me.

## 2019-04-07 NOTE — Assessment & Plan Note (Signed)
This problem is chronic and uncontrolled.  Her A1c trend has been 6.5 - 7.1 - 5.4 today.  She is on Victoza 1.8 and NovoLog 15 to 20 units 4 times a day with meals.  She brought in her glucose meter today which was downloaded and a 42% were within target with no hypoglycemic episodes.  Her average glucose was 156 with multiple readings above 180.  Her glucose download clearly shows uncontrolled hyperglycemia but her A1c would suggest asymptomatic hypoglycemia tht is rather frequent.  She was agreeable to a continuous glucose monitor placement today, professional.  I collaborated anf discussed her management with Butch Penny, our diabetes educator who placed the professional CGM.  She will also discuss personal CGM with the patient.  PLAN:  Cont current meds Prof CGM RTC 1 week

## 2019-04-07 NOTE — Assessment & Plan Note (Signed)
This problem is new.  She complains today of severe shaking of her legs when she was standing at the door talking to a friend.  She had to hold onto the door.  This is occurred previously and only when she is standing.  When she wakes up, stated she feels so bad that she is afraid to stand up.  She eventually stands up and moves around and it feels a bit better.  She does not endorse classic claudication symptoms but she does have poorly controlled diabetes and tobacco use.  Chart review showed that she had ABIs in 2012 which showed left ankle ABI of 0.9, right total of 0.55, normal left toe, and normal right ankle.  She is prescribed Crestor 20, high intensity statin but no antiplatelet agent as she is on warfarin for A. fib.  I have referred her to see H&P heart care for formal evaluation of PAD.  PLAN : Cont statin Will need to address smoking  [facility]

## 2019-04-07 NOTE — Assessment & Plan Note (Signed)
This problem is chronic and worsened.  She has an appointment with her gynecologist

## 2019-04-07 NOTE — Assessment & Plan Note (Signed)
This problem is chronic and well controlled.  She is on warfarin and rate controlled with metoprolol.  Her heart rate is well controlled.  She does home INR monitoring, calling the results to Dr. Elie Confer, who sends the notes to me for cosigning.  Her INR has recently been therapeutic.  PLAN:  Cont current meds

## 2019-04-07 NOTE — Patient Instructions (Signed)
See Mychart message from today

## 2019-04-07 NOTE — Telephone Encounter (Signed)
[name] request prescription for sensor for her Freestyle Libre 2 CGM be sent to [facility] order pharmacy

## 2019-04-08 ENCOUNTER — Telehealth: Payer: Self-pay | Admitting: Internal Medicine

## 2019-04-08 MED ORDER — FREESTYLE LIBRE 2 SENSOR MISC: 1.0000 | Freq: Every day | 3 refills | Status: DC

## 2019-04-08 NOTE — Telephone Encounter (Signed)
Pls contact [phone] regarding her AVS, the patient says she doesn't have bladder cancer she have kindey cancer

## 2019-04-08 NOTE — Telephone Encounter (Signed)
Pls let her know that was a typo in my hurry. Her PL correctly lists Clear cell renal cell carcinoma

## 2019-04-08 NOTE — Telephone Encounter (Signed)
Pt notified. SChaplin, RN,BSN

## 2019-04-11 ENCOUNTER — Telehealth: Payer: Self-pay | Admitting: *Deleted

## 2019-04-11 ENCOUNTER — Telehealth: Payer: Self-pay | Admitting: Pharmacist

## 2019-04-11 ENCOUNTER — Other Ambulatory Visit: Payer: Self-pay | Admitting: *Deleted

## 2019-04-11 DIAGNOSIS — Z794 Long term (current) use of insulin: Secondary | ICD-10-CM

## 2019-04-11 DIAGNOSIS — E1140 Type 2 diabetes mellitus with diabetic neuropathy, unspecified: Secondary | ICD-10-CM

## 2019-04-11 MED ORDER — FREESTYLE LIBRE 2 SENSOR MISC: 1.0000 | Freq: Every day | 3 refills | Status: DC

## 2019-04-11 NOTE — Telephone Encounter (Signed)
Patient texted results of PST FS POC INR = 2.0 (goal 1.5 - 2.5) on 1 x 2.1m every day of week EXCEPT NO WARFARIN on Mondays. Will continue this same regimen. Denies any bleeding or bruising. No new medications.

## 2019-04-12 ENCOUNTER — Other Ambulatory Visit (HOSPITAL_COMMUNITY): Payer: Self-pay | Admitting: *Deleted

## 2019-04-12 DIAGNOSIS — I5032 Chronic diastolic (congestive) heart failure: Secondary | ICD-10-CM

## 2019-04-12 DIAGNOSIS — I480 Paroxysmal atrial fibrillation: Secondary | ICD-10-CM

## 2019-04-12 MED ORDER — POTASSIUM CHLORIDE CRYS ER 20 MEQ PO TBCR: EXTENDED_RELEASE_TABLET | ORAL | 1 refills | Status: DC

## 2019-04-12 MED ORDER — FUROSEMIDE 80 MG PO TABS: ORAL_TABLET | ORAL | 2 refills | Status: DC

## 2019-04-12 MED ORDER — METOPROLOL SUCCINATE ER 50 MG PO TB24: 50.0000 mg | ORAL_TABLET | Freq: Every day | ORAL | 3 refills | Status: DC

## 2019-04-13 ENCOUNTER — Encounter: Payer: Self-pay | Admitting: Licensed Clinical Social Worker

## 2019-04-13 ENCOUNTER — Ambulatory Visit (INDEPENDENT_AMBULATORY_CARE_PROVIDER_SITE_OTHER): Payer: HMO | Admitting: Licensed Clinical Social Worker

## 2019-04-13 ENCOUNTER — Other Ambulatory Visit: Payer: Self-pay

## 2019-04-13 DIAGNOSIS — F321 Major depressive disorder, single episode, moderate: Secondary | ICD-10-CM

## 2019-04-13 NOTE — BH Specialist Note (Signed)
Integrated Behavioral Health Visit via Telemedicine (Telephone)  04/13/2019 [name] [id number]   Session Start time: 12:25  Session End time: 1:00 Total time: 35  minutes  Referring Provider: Dr. Sharon Seller Type of Visit: Telephonic Patient location: Home [facility] Provider location: Office All persons participating in visit: Patient, [facility], [facility] intern  Confirmed patient's address: Yes  Confirmed patient's phone number: Yes  Any changes to demographics: No   Discussed confidentiality: Yes    The following statements were read to the patient and/or legal guardian that are established with the [facility] Provider.  "The purpose of this phone visit is to provide behavioral health care while limiting exposure to the coronavirus (COVID19).  There is a possibility of technology failure and discussed alternative modes of communication if that failure occurs."  "By engaging in this telephone visit, you consent to the provision of healthcare.  Additionally, you authorize for your insurance to be billed for the services provided during this telephone visit."   Patient and/or legal guardian consented to telephone visit: Yes   PRESENTING CONCERNS: Patient and/or family reports the following symptoms/concerns: memory issues, anxiety, moderate levels of depression, chronic health, and lack of natural resources. Duration of problem: increased over the past 8 years; Severity of problem: moderate  GOALS ADDRESSED: Patient will: 1.  Reduce symptoms of: agitation, anxiety, depression, mood instability and stress  2.  Increase knowledge and/or ability of: coping skills, healthy habits, self-management skills and stress reduction  3.  Demonstrate ability to: Increase healthy adjustment to current life circumstances, Increase adequate support systems for patient/family and Increase motivation to adhere to plan of care  INTERVENTIONS: Interventions utilized:  Motivational Interviewing,  Brief CBT, Supportive Counseling and Sleep Hygiene Standardized Assessments completed: assessed for SI, HI, and self-harm.  ASSESSMENT: Patient currently experiencing moderate levels of depression and chronic health issue. Patient reported that she wakes up in pain several days a week. Pt reported that the pain impacts her mood the following day. Patient reported issues with sleep (only sleeps around 3 hours at a time). Patient reported that she has difficulty falling back to sleep after the three hour stint of sleeping. Pt sleeps at night and during the day time.   Symptoms of depression: isolation, loneliness, sleep issues, and fatigue. Denies any SI. Patient responds well to interactions with her family. Patient reported she does dwell on being alone. Pt would like to get another dog, but she is unsure if she could care for it.   Patient may benefit from counseling.  PLAN: 1. Follow up with behavioral health clinician on : two weeks.   Dessie Coma, [facility], [place]

## 2019-04-14 ENCOUNTER — Encounter: Payer: Self-pay | Admitting: Internal Medicine

## 2019-04-14 ENCOUNTER — Ambulatory Visit (INDEPENDENT_AMBULATORY_CARE_PROVIDER_SITE_OTHER): Payer: HMO | Admitting: Dietician

## 2019-04-14 ENCOUNTER — Ambulatory Visit (INDEPENDENT_AMBULATORY_CARE_PROVIDER_SITE_OTHER): Payer: HMO | Admitting: Internal Medicine

## 2019-04-14 DIAGNOSIS — Z794 Long term (current) use of insulin: Secondary | ICD-10-CM | POA: Diagnosis not present

## 2019-04-14 DIAGNOSIS — Z713 Dietary counseling and surveillance: Secondary | ICD-10-CM

## 2019-04-14 DIAGNOSIS — E1140 Type 2 diabetes mellitus with diabetic neuropathy, unspecified: Secondary | ICD-10-CM | POA: Diagnosis not present

## 2019-04-14 MED ORDER — NOVOLOG FLEXPEN 100 UNIT/ML ~~LOC~~ SOPN: PEN_INJECTOR | SUBCUTANEOUS | 5 refills | Status: DC

## 2019-04-14 NOTE — Patient Instructions (Signed)
Good job on the records and using your new CGM!!!  We adjusted your low alarm to 90 mg/dl today  We also discussed using less Novolog insulin before eating.   Decrease your doses to  5 units for small meals 10 units for medium sized meals 15 units for large meals No more than 20 for very large carbohydrate meals  Please upload to [place] and call me when you do because otherwise I won't look for it.Butch Penny [phone]

## 2019-04-14 NOTE — Patient Instructions (Signed)
Please return in 7 days with [place] for Download #2.

## 2019-04-14 NOTE — Assessment & Plan Note (Signed)
  DMII: Hgb A1c 5.4 % at the last visit down from 7.1%. The patient denied polyuria, polydipsia, headache, fatigue, confusion, nausea, vomiting or diaphoresis. Her current A1c is below recommended guidelines. I agree that we should decrease her meal time insulin regimen in an attempt to reduce the risk for hypoglycemia. [name] wore the CGM for 7 days. The average reading was 124, % time in target was 95, % time below target was 1% and 4% time above target. Intervention will be to decrease her meal time insulin sliding scale at 5-20U decreased from 10-25U. The patient will be scheduled to see Butch Penny and [facility] for a final appointment.   Plan: Continue Victoza 1.92m daily Continue SSI Novolog with scale decreased from 10-25U to 5-20U per CGM Continue CGM device monitoring  Repeat A1c at next visit

## 2019-04-14 NOTE — Progress Notes (Signed)
CC: DMII monitoring  HPI:[name] is a 69 y.o. female who presents for evaluation of DMII. Please see individual problem based A/P for details.  Past Medical History:  Diagnosis Date  . Adenomatous polyps 05/14/2011   Colonoscopy (05/2011): 4 mm adenomatous polyp excised endoscopically Colonoscopy (02/2002): Adenomatous polyp excised endoscopically   . Allergic rhinitis 06/01/2012  . Anemia of chronic disease 01/01/2013  . Anxiety 07/24/2010  . Aortic atherosclerosis ([place]) 10/19/2014   Seen on CT scan, currently asymptomatic  . 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 Arthritis    "lower back; hands" (02/19/2018)  . Asymptomatic cholelithiasis 09/25/2015   Seen on CT scan 08/2015  . Carotid artery stenosis    s/p right endarterectomy (06/2010) Carotid US (07/2010):  Left: Moderate-to-severe (60-79%) calcific and non-calcific plaque origin and proximal ICA and ECA   . Chronic congestive heart failure with left ventricular diastolic dysfunction ([place]) 10/21/2010  . Chronic constipation 02/03/2011  . Chronic daily headache 01/16/2014  . Chronic headache    "weekly now" (11/13/2017)  . Chronic low back pain 10/06/2012  . Chronic venous insufficiency 08/04/2012  . Clear cell renal cell carcinoma ([place]) 07/21/2011   s/p cryoablation of left RCC in 09/2011 by [name]. Followed by [name]  [facility]) .    Marland Kitchen COPD (chronic obstructive pulmonary disease) with emphysema (HCC)    PFTs 02/2012: FEV1 0.92 (40%), ratio 69, 27% increase in FEV1 with BD, TLC 91%, severe airtrapping, DLCO49% On chronic home O2. Pulmonary rehab referral 05/2012   . Daily headache    "recently" (02/19/2018)  . Depression 11/19/2005  . Esophageal stenosis 08/08/2015   Mild, benign-appearing on EGD 06/29/2015  . Fibromyalgia 08/29/2010  . Gastroesophageal reflux disease   . History of blood transfusion    "several  times"  (02/19/2018)  . History of hiatal hernia   . History of kidney stones   . Hyperlipidemia LDL goal < 100 11/20/2005  . Hypertension   . Internal hemorrhoids 08/04/2012  . Lichen sclerosus of female genitalia 01/12/2017  . Migraine    "none in years" (02/19/2018)  . Mitral stenosis    s/p Mitral valve replacement with a 27-mm pericardial porcine valve (Medtronic Mosaic valve, serial #[id number] on 09/20/10, Dr. Prescott Gum)   . Moderate to severe pulmonary hypertension ([place])    2014 TEE w PA peak pressure 46 mmHg, s/p MV replacement   . Moderately severe major depression ([place]) 11/19/2005  . Obesity (BMI 30.0-34.9) 10/23/2011  . Obstructive sleep apnea    Nocturnal polysomnography (06/2009): Moderate sleep apnea/ hypopnea syndrome , AHI 17.8 per hour with nonpositional hypopneas. CPAP titration to 12 CWP, AHI 2.4 per hour. On nocturnal CPAP via a small resMed Quattro full-face mask with heated humidifier.   . On home oxygen therapy    "2L O2 w/CPAP when I sleep" (02/19/2018)  . OSA on CPAP    "2L O2 w/CPAP when I sleep" (02/19/2018)  . Osteoporosis    DEXA (12/09/2011): L-spine T -3.7, left hip T -1.4 DEXA (12/2004): L-spine T -2.6, left hip -0.1   . Paroxysmal atrial fibrillation ([place]) 10/22/2010   s/p Left atrial maze procedure for paroxysmal atrial fibrillation on 09/20/2010 by Dr Prescott Gum.  Subsequent splenic infarct, decision was made to re-anticoagulate with coumadin, likely life-long as this is the most likely cause of the splenic infarct.   . Pneumonia    "once"  (02/19/2018)  . Pulmonary  hypertension due to chronic obstructive pulmonary disease ([place]) 04/25/2016  . Right nephrolithiasis 09/06/2014   5 mm non-obstructing calculus seen on CT scan 09/05/2014   . Seborrheic keratosis 09/28/2015  . Severe obesity (BMI 35.0-39.9) with comorbidity ([place]) 10/23/2011  . Shortness of breath dyspnea   . Tobacco abuse 07/28/2012  . Type 2 diabetes mellitus with diabetic neuropathy (HCC)    Review  of Systems:  ROS negative except as per HPI.  Physical Exam: Vitals:   04/14/19 1131  BP: (!) 129/48  Pulse: (!) 55  Temp: 98.5 F (36.9 C)  TempSrc: Oral  SpO2: 99%  Weight: 178 lb 3.2 oz (80.8 kg)  Height: 5' 1" (1.549 m)   Filed Weights   04/14/19 1131  Weight: 178 lb 3.2 oz (80.8 kg)   General: A/O x4, in no acute distress, afebrile, nondiaphoretic HEENT: PEERL, EMO intact Neuro: Alert, conversational Psych: Appropriate affect, not depressed in appearance, engages well  Assessment & Plan:   See Encounters Tab for problem based charting.  Patient discussed with Dr. Dareen Piano

## 2019-04-15 ENCOUNTER — Encounter: Payer: Self-pay | Admitting: Dietician

## 2019-04-15 NOTE — Progress Notes (Signed)
Time in:10:15 am Time out: 11:00 am   Assessment: RD and Intern met with pt to discuss CGM download and review food journal records. Pt has Freestyle Libre 2 Personal CGM and stated the sensors are in the mail. RD discussed hypoglycemic data and prevention.  Nutrition Dx: Self monitoring deficit (NB-1.4) related to food and nutrition related knowledge deficit concerning self monitoring as evidenced by food journal dietary recall and DM dx associated with self monitoring.  Intervention: RD adjusted alarm to 90 mg/dL to assist with the frequent alarm sound. Insulin dosage was adjusted in response to hypoglycemic data. Insulin therapy changes that were made are:  Decrease your doses to   -5 units for small meals  -10 units for medium sized meals  -15 units for large meals  -No more than 20 for very large carbohydrate meals  Monitor/Evaluate: (1 week) dietary intake, physical activity, CGM data, HbA1c  [name] Dietetic Intern 03.05.2021  

## 2019-04-15 NOTE — Telephone Encounter (Signed)
Reassured, she had bad dreams. Told her dr Software engineer said hello, she returned the greeting

## 2019-04-15 NOTE — Telephone Encounter (Signed)
Pls contact [phone] pt

## 2019-04-15 NOTE — Progress Notes (Addendum)
CGM Results from Download #1  Average is  124  for 6 days Glucose management indicator 6.3 % Time in range (70-180 mg/dL): 95 % (Goal >70%) Time High (181-250 mg/dL) 4 % (Goal < 25%) Time Very High (>250 mg/dl) 0 % (Goal < 5%) Time Low (54-69 mg/dL): 1 % (Goal is <4%) Time Very Low (<54) 0%  (Goal <1%)  Coefficient of variation:24.3% (Goal is <36%)  She is doing very well with hr personal CGM. She is to follow up next week.   I reviewed and approve this note and the plan. Butch Penny Holmes Hays, RD 04/18/2019 10:30 AM.

## 2019-04-18 ENCOUNTER — Telehealth: Payer: Self-pay | Admitting: *Deleted

## 2019-04-18 ENCOUNTER — Telehealth: Payer: Self-pay | Admitting: Pharmacist

## 2019-04-18 ENCOUNTER — Telehealth: Payer: Self-pay | Admitting: Dietician

## 2019-04-18 NOTE — Progress Notes (Signed)
Internal Medicine Clinic Attending  Case discussed with Dr. Harbrecht at the time of the visit.  We reviewed the resident's history and exam and pertinent patient test results.  I agree with the assessment, diagnosis, and plan of care documented in the resident's note.   

## 2019-04-18 NOTE — Telephone Encounter (Signed)
Patient texted results of PST FS POC INR 2.0 (goal 1.5 - 2.5) on 37m warfarin/wk (as 2.548mx 1 tablet, Tu/We/Th/Fr/Sa/Su; NO WARFARIN on Mondays. Instructed to CONTINUE same regimen. Repeat INR 15-MAR-21 and text to me. No bleeding reported.

## 2019-04-18 NOTE — Telephone Encounter (Signed)
Successfully connected and uploaded Freestyle Libre 2 CGM. Appointment made to next week via telehealth to discuss her diabetes and CGM upload.

## 2019-04-18 NOTE — Telephone Encounter (Signed)
Thank you. I agree

## 2019-04-18 NOTE — Telephone Encounter (Signed)
Received fax from [facility] with INR results from 11/29/2018 to 04/18/2019. Last INR 2.0 on 04/18/2019. Results placed in Dr. Gladstone Pih box. Hubbard Hartshorn, BSN, RN-BC

## 2019-04-21 ENCOUNTER — Other Ambulatory Visit: Payer: Self-pay | Admitting: Internal Medicine

## 2019-04-21 ENCOUNTER — Ambulatory Visit: Payer: HMO | Admitting: Cardiology

## 2019-04-21 ENCOUNTER — Telehealth: Payer: Self-pay | Admitting: Dietician

## 2019-04-21 DIAGNOSIS — M545 Low back pain, unspecified: Secondary | ICD-10-CM

## 2019-04-21 DIAGNOSIS — G8929 Other chronic pain: Secondary | ICD-10-CM

## 2019-04-21 NOTE — Telephone Encounter (Signed)
Discussed patches to go over freestyle [place] sensor.

## 2019-04-21 NOTE — Telephone Encounter (Signed)
Need refill on HYDROcodone-acetaminophen (NORCO/VICODIN) 5-325 MG tablet  ;pt contact [name] [place] [facility] [place]

## 2019-04-22 MED ORDER — HYDROCODONE-ACETAMINOPHEN 5-325 MG PO TABS: 2.0000 | ORAL_TABLET | Freq: Four times a day (QID) | ORAL | 0 refills | Status: DC | PRN

## 2019-04-25 ENCOUNTER — Telehealth: Payer: Self-pay | Admitting: Pharmacist

## 2019-04-25 NOTE — Telephone Encounter (Signed)
Patient texted me results of PST FS POC INR 2.9 (goal 1.5 - 2.5) on 20m warfarin/wk. Will OMIT today's dose (as she always dose), give 1/2 x 2.521mon Tue and 2.85m40mll other days. Repeat INR 22-MAR-21. No bleeding. No new medications.

## 2019-04-26 ENCOUNTER — Other Ambulatory Visit: Payer: Self-pay

## 2019-04-26 ENCOUNTER — Encounter: Payer: Self-pay | Admitting: Cardiovascular Disease

## 2019-04-26 ENCOUNTER — Telehealth: Payer: Self-pay | Admitting: *Deleted

## 2019-04-26 ENCOUNTER — Ambulatory Visit (INDEPENDENT_AMBULATORY_CARE_PROVIDER_SITE_OTHER): Payer: HMO | Admitting: Cardiovascular Disease

## 2019-04-26 ENCOUNTER — Ambulatory Visit: Payer: HMO | Admitting: Cardiovascular Disease

## 2019-04-26 VITALS — BP 138/56 | HR 56 | Ht 62.0 in | Wt 174.6 lb

## 2019-04-26 DIAGNOSIS — R0989 Other specified symptoms and signs involving the circulatory and respiratory systems: Secondary | ICD-10-CM

## 2019-04-26 DIAGNOSIS — R6889 Other general symptoms and signs: Secondary | ICD-10-CM

## 2019-04-26 NOTE — Progress Notes (Signed)
04/26/2019 [name]   [birth year]  [id number]  Primary Physician Bartholomew Crews, MD Primary Cardiologist: Lorretta Harp MD Lupe Carney, [place]  HPI:  [name] is a 69 y.o. moderately overweight divorced Caucasian female mother of 2 children, grandmother 40 grandchildren referred to me by Dr. Lynnae January for peripheral vascular valuation because of poorly palpable pedal pulses.  She has a history of 50 pack years of tobacco abuse.  She has treated diabetes, hypertension and hyperlipidemia.  She had mitral valve replacement in 2002 with a bioprosthetic valve.  I performed cardiac catheterization on her 04/21/2016 in the setting of non-STEMI revealing nonobstructive CAD.  Patient denies chest pain or shortness of breath.  She is minimally ambulatory and basically walks around her house.  She has leg pain mostly in the morning when she is in bed but really denies claudication.  There is no evidence of critical limb ischemia.   Current Meds  Medication Sig  . albuterol (PROAIR HFA) 108 (90 Base) MCG/ACT inhaler Inhale 2 puffs into the lungs every 6 (six) hours as needed for shortness of breath.  . ALPRAZolam (XANAX) 1 MG tablet TAKE 1 TABLET BY MOUTH EVERY NIGHT AT BEDTIME AS NEEDED FOR INSOMNIA. MAY TAKE 1/2 TABLET BY MOUTH DURING THE DAY FOR ANXIETY  . benzonatate (TESSALON) 100 MG capsule Take 1 capsule (100 mg total) by mouth 3 (three) times daily as needed for cough.  . Blood Glucose Monitoring Suppl (ONETOUCH VERIO) W/DEVICE KIT 1 each by Does not apply route 4 (four) times daily.  Marland Kitchen buPROPion (WELLBUTRIN) 75 MG tablet Take 1 tablet (75 mg total) by mouth 2 (two) times daily.  . chlorpheniramine (CHLOR-TRIMETON) 4 MG tablet Take 4 mg by mouth daily as needed for allergies.  . clobetasol cream (TEMOVATE) 7.86 % Apply 1 application topically 2 (two) times daily as needed (lichen sclerosus flare).  . Continuous Blood Gluc Sensor (FREESTYLE LIBRE 2 SENSOR) MISC  1 each by Does not apply route 6 (six) times daily. To read 6 times daily with change of sensor every 2 weeks, 90 days worth with 3 refills  . DULoxetine (CYMBALTA) 60 MG capsule TAKE 1 CAPSULE BY MOUTH 2 TIMES A DAY  . ferrous gluconate (FERGON) 324 MG tablet Take 1 tablet (324 mg total) by mouth 2 (two) times daily with a meal. (Patient taking differently: Take 648 mg by mouth See admin instructions. Take 2 tablets (648 mg) by mouth daily with lunch or supper)  . fluticasone (FLONASE) 50 MCG/ACT nasal spray Place 2 sprays into both nostrils daily.  . Fluticasone-Salmeterol (ADVAIR) 500-50 MCG/DOSE AEPB INHALE 1 PUFF INTO THE LUNGS TWICE DAILY  . furosemide (LASIX) 80 MG tablet TAKE 1 TABLET BY MOUTH EVERY MORNING AND TAKE 1/2 TABLET EVERY EVENING  . gabapentin (NEURONTIN) 300 MG capsule Take 2 capsules (600 mg total) by mouth 3 (three) times daily.  Marland Kitchen glucose blood (ONETOUCH VERIO) test strip Use to check blood sugar 4 times daily. diag code E11.40. Insulin dependent  . HYDROcodone-acetaminophen (NORCO/VICODIN) 5-325 MG tablet Take 2 tablets by mouth every 6 (six) hours as needed for severe pain.  . hydrocortisone cream 1 % APPLY EXTERNALLY TO THE AFFECTED AREA TWICE DAILY (Patient taking differently: APPLY EXTERNALLY TO THE AFFECTED AREA TWICE DAILY prn)  . insulin aspart (NOVOLOG FLEXPEN) 100 UNIT/ML FlexPen INJECT 5 UNITS TO 20 UNITS SUBCUTANEOUSLY FOUR TIMES A DAY BEFORE MEALS AND AT BEDTIME  . Insulin Pen Needle 32G X 4 MM  MISC Use to inject insulin up to 6 times a day  . Insulin Syringe-Needle U-100 31G X 15/64" 0.5 ML MISC Use to inject insulin up to 4 times a day  . ipratropium-albuterol (DUONEB) 0.5-2.5 (3) MG/3ML SOLN INHALE THE CONTENTS OF 1 VIAL VIA NEBULIZER EVERY 6 HOURS AS NEEDED FOR SHORTNESS OF BREATH  . Lancets Misc. (ACCU-CHEK FASTCLIX LANCET) KIT Check your blood 4 times a day dx code 250.00 insulin requiring  . metoprolol succinate (TOPROL-XL) 50 MG 24 hr tablet Take 1 tablet  (50 mg total) by mouth daily. Take with or immediately following a meal.  . nystatin cream (MYCOSTATIN) Apply 1 application topically 2 (two) times daily as needed (rash).   Marland Kitchen omeprazole (PRILOSEC) 40 MG capsule Take 1 capsule (40 mg total) by mouth 2 (two) times daily.  . ondansetron (ZOFRAN) 4 MG tablet TAKE 1 TABLET BY MOUTH EVERY 8 HOURS AS NEEDED FOR NAUSEA OR VOMITING  . OXYGEN Inhale 2 L into the lungs as needed (for shortness of breath while active).   . potassium chloride SA (KLOR-CON) 20 MEQ tablet TAKE 2 TABLETS BY MOUTH EVERY MORNING AND TAKE 1 TABLET BY MOUTH EVERY EVENING  . PRESCRIPTION MEDICATION Inhale into the lungs at bedtime. CPAP  . rosuvastatin (CRESTOR) 20 MG tablet Take 1 tablet (20 mg total) by mouth at bedtime.  . Tiotropium Bromide Monohydrate (SPIRIVA RESPIMAT) 2.5 MCG/ACT AERS Inhale 2 puffs into the lungs daily.  Marland Kitchen VICTOZA 18 MG/3ML SOPN INJECT 1.8MG SUBCUTANEOUSLY DAILY  . warfarin (COUMADIN) 2.5 MG tablet 2.44m warfarin every day except on Mondays - take NO TABLETS and on Tuesdays take ONLY 1/2 pill   Current Facility-Administered Medications for the 04/26/19 encounter (Office Visit) with BLorretta Harp MD  Medication  . senna-docusate (Senokot-S) tablet 1 tablet     Allergies  Allergen Reactions  . Lorazepam   . Lorazepam Other (See Comments)    Patient's sister noted that ativan caused the patient to become extremely confused during hospitalization 09/2010; tolerates Xanax  . Morphine And Related Other (See Comments)    Injection site reaction  . Oxycontin [Oxycodone] Other (See Comments)    headache  . Tramadol Hcl Swelling    Ankle swelling    Social History   Socioeconomic History  . Marital status: Divorced    Spouse name: Not on file  . Number of children: Not on file  . Years of education: Not on file  . Highest education level: Not on file  Occupational History  . Not on file  Tobacco Use  . Smoking status: Current Every Day Smoker      Packs/day: 1.00    Years: 55.00    Pack years: 55.00    Types: Cigarettes    Start date: 11/01/1962  . Smokeless tobacco: Never Used  Substance and Sexual Activity  . Alcohol use: Not Currently    Comment: 02/19/2018 "nothing since 1999"  . Drug use: Not Currently    Types: Cocaine, Marijuana    Comment: 02/19/2018 "nothing since the 1990s"  . Sexual activity: Not Currently    Birth control/protection: Post-menopausal  Other Topics Concern  . Not on file  Social History Narrative   [place]   Worked at [employer] years   No car   Social Determinants of HRadio broadcast assistantStrain: LRodey  . Difficulty of Paying Living Expenses: Not hard at all  Food Insecurity: No Food Insecurity  .  Worried About Charity fundraiser in the Last Year: Never true  . Ran Out of Food in the Last Year: Never true  Transportation Needs: No Transportation Needs  . Lack of Transportation (Medical): No  . Lack of Transportation (Non-Medical): No  Physical Activity: Inactive  . Days of Exercise per Week: 0 days  . Minutes of Exercise per Session: 0 min  Stress: No Stress Concern Present  . Feeling of Stress : Only a little  Social Connections: Unknown  . Frequency of Communication with Friends and Family: More than three times a week  . Frequency of Social Gatherings with Friends and Family: Not on file  . Attends Religious Services: Not on file  . Active Member of Clubs or Organizations: Not on file  . Attends Archivist Meetings: Not on file  . Marital Status: Divorced  Human resources officer Violence:   . Fear of Current or Ex-Partner:   . Emotionally Abused:   Marland Kitchen Physically Abused:   . Sexually Abused:      Review of Systems: General: negative for chills, fever, night sweats or weight changes.  Cardiovascular: negative for chest pain, dyspnea on exertion, edema, orthopnea, palpitations, paroxysmal nocturnal dyspnea or  shortness of breath Dermatological: negative for rash Respiratory: negative for cough or wheezing Urologic: negative for hematuria Abdominal: negative for nausea, vomiting, diarrhea, bright red blood per rectum, melena, or hematemesis Neurologic: negative for visual changes, syncope, or dizziness All other systems reviewed and are otherwise negative except as noted above.    Blood pressure (!) 138/56, pulse (!) 56, height _0  (1.575 m), weight 174 lb 9.6 oz (79.2 kg), SpO2 94 %.  General appearance: alert and no distress Neck: no adenopathy, no JVD, supple, symmetrical, trachea midline, thyroid not enlarged, symmetric, no tenderness/mass/nodules and Right carotid bruit Lungs: clear to auscultation bilaterally Heart: regular rate and rhythm, S1, S2 normal, no murmur, click, rub or gallop Extremities: Bilateral feet are somewhat cyanotic Pulses: Absent pedal pulses Skin: Skin color, texture, turgor normal. No rashes or lesions Neurologic: Alert and oriented X 3, normal strength and tone. Normal symmetric reflexes. Normal coordination and gait  EKG not performed today  ASSESSMENT AND PLAN:   Claudication [facility]) Ms. Prunty was referred to me by Dr. Lynnae January for evaluation of abnormal ABIs.  She apparently was seen by Dr. Lynnae January and pedal pulses were not palpable.  She had point-of-care ABIs that were mildly abnormal.  Her right ankle-brachial index was 0.96 with what appears to be minor biphasic waveforms.  She is minimally ambulatory and basically walks in her house.  She has pain all the time especially in the morning" while out lying in bed but really denies claudication type symptoms.  There is no evidence of critical limb ischemia.  I am going to get lower extremity arterial Doppler studies to further evaluate.      Lorretta Harp MD FACP,FACC,FAHA, [facility] 04/26/2019 10:43 AM

## 2019-04-26 NOTE — Assessment & Plan Note (Signed)
[name] was referred to me by Dr. Lynnae January for evaluation of abnormal ABIs.  She apparently was seen by Dr. Lynnae January and pedal pulses were not palpable.  She had point-of-care ABIs that were mildly abnormal.  Her right ankle-brachial index was 0.96 with what appears to be minor biphasic waveforms.  She is minimally ambulatory and basically walks in her house.  She has pain all the time especially in the morning" while out lying in bed but really denies claudication type symptoms.  There is no evidence of critical limb ischemia.  I am going to get lower extremity arterial Doppler studies to further evaluate.

## 2019-04-26 NOTE — Telephone Encounter (Signed)
Received fax from [facility] with INR results from 12/13/2018 to 04/25/2019. Last INR 2.9 on 04/25/2019. Dr. Elie Confer is already aware. Results placed in Dr. Gladstone Pih office. Hubbard Hartshorn, BSN, RN-BC

## 2019-04-26 NOTE — Patient Instructions (Addendum)
Medication Instructions:  NO CHANGE *If you need a refill on your cardiac medications before your next appointment, please call your pharmacy*   Lab Work: If you have labs (blood work) drawn today and your tests are completely normal, you will receive your results only by: Marland Kitchen MyChart Message (if you have MyChart) OR . A paper copy in the mail If you have any lab test that is abnormal or we need to change your treatment, we will call you to review the results.   Testing/Procedures: Your physician has requested that you have a lower extremity arterial duplex. During this test, ultrasound are used to evaluate arterial blood flow in the legs. Allow one hour for this exam. There are no restrictions or special instructions. [place] physician has requested that you have a carotid duplex. This test is an ultrasound of the carotid arteries in your neck. It looks at blood flow through these arteries that supply the brain with blood. Allow one hour for this exam. There are no restrictions or special instructions.[facility]   Follow-Up: At [facility], you and your health needs are our priority.  As part of our continuing mission to provide you with exceptional heart care, we have created designated Provider Care Teams.  These Care Teams include your primary Cardiologist (physician) and Advanced Practice Providers (APPs -  Physician Assistants and Nurse Practitioners) who all work together to provide you with the care you need, when you need it.  We recommend signing up for the patient portal called "MyChart".  Sign up information is provided on this After Visit Summary.  MyChart is used to connect with patients for Virtual Visits (Telemedicine).  Patients are able to view lab/test results, encounter notes, upcoming appointments, etc.  Non-urgent messages can be sent to your provider as well.   To learn more about what you can do with MyChart, go to [email or web address].    Your  next appointment:   AS NEEDED

## 2019-04-27 ENCOUNTER — Encounter: Payer: Self-pay | Admitting: Licensed Clinical Social Worker

## 2019-04-27 ENCOUNTER — Ambulatory Visit (INDEPENDENT_AMBULATORY_CARE_PROVIDER_SITE_OTHER): Payer: HMO | Admitting: Dietician

## 2019-04-27 ENCOUNTER — Ambulatory Visit (INDEPENDENT_AMBULATORY_CARE_PROVIDER_SITE_OTHER): Payer: HMO | Admitting: Licensed Clinical Social Worker

## 2019-04-27 ENCOUNTER — Encounter: Payer: Self-pay | Admitting: Dietician

## 2019-04-27 DIAGNOSIS — E1140 Type 2 diabetes mellitus with diabetic neuropathy, unspecified: Secondary | ICD-10-CM | POA: Diagnosis not present

## 2019-04-27 DIAGNOSIS — Z794 Long term (current) use of insulin: Secondary | ICD-10-CM

## 2019-04-27 DIAGNOSIS — F321 Major depressive disorder, single episode, moderate: Secondary | ICD-10-CM

## 2019-04-27 DIAGNOSIS — Z713 Dietary counseling and surveillance: Secondary | ICD-10-CM

## 2019-04-27 NOTE — Progress Notes (Signed)
Diabetes- personal Continuous glucose monitor follow up:  Start time: 7867  End time: 1350   This is a telephone encounter between [name]  and [name] on 04/27/2019 for Medical Nutrition Therapy. The visit was conducted with the patient located at home and [name]  at [facility]. The patient's identity was confirmed using their DOB and current address. The patient has consented to being evaluated through a telephone encounter and understands the associated risks / benefits (allows the patient to remain at home, decreasing exposure to coronavirus). I personally spent 33 minutes on medical nutrition therapy discussion.    Assessment:  "I like this Continuous glucose monitor. " RD spoke with pt to discuss CGM download and review food journal records. Pt has Freestyle Libre 2 Personal CGM and states she is having no problems operating it or placing sensors. . RD discussed hypoglycemic data and prevention.   [name] had 3 lows after steep drops in blood sugar likely coming from administration of prandial insulin: She prepares her food them take insulin then eats. We discussed her trying to take insulin , prepare meal then eat to give it 15-20 minutes between taking her insulin and eating to better match the peaks of her insuli nwith the peak of her food. She also took extra insulin too soon after her previous dose because of the higher blood sugar this ca also contributing to her hypoglycemia.  Nutrition Dx: Self monitoring deficit (NB-1.4) related to food and nutrition related knowledge deficit concerning self monitoring as evidenced by food journal dietary recall and DM dx associated with self monitoring is improving.  Intervention: RD explained likely causes of hypoglycemia, action of insulin and importance of timing with food. Plan is to try taking mealtime insulin 15-20 minutes before eating.   Monitor/Evaluate: 2 months, a1c, dietary intake, physical activity, CGM data,  HbA1c  [name], RD 04/27/2019 1:59 PM.

## 2019-04-27 NOTE — BH Specialist Note (Signed)
Integrated Behavioral Health Visit via Telemedicine (Telephone)  04/27/2019 [name] [id number]   Session Start time: 12:00  Session End time: 12:30 Total time: 30 minutes  Referring Provider: Dr. Lynnae January Type of Visit: Telephonic Patient location: Home [facility] Provider location: Office All persons participating in visit: Patient, [facility], and [employer] intern  Confirmed patient's address: Yes  Confirmed patient's phone number: Yes  Any changes to demographics: No   Discussed confidentiality: Yes    The following statements were read to the patient and/or legal guardian that are established with the [facility] Provider.  "The purpose of this phone visit is to provide behavioral health care while limiting exposure to the coronavirus (COVID19).  There is a possibility of technology failure and discussed alternative modes of communication if that failure occurs."  "By engaging in this telephone visit, you consent to the provision of healthcare.  Additionally, you authorize for your insurance to be billed for the services provided during this telephone visit."   Patient and/or legal guardian consented to telephone visit: Yes   PRESENTING CONCERNS: Patient and/or family reports the following symptoms/concerns: memory issues, anxiety, moderate levels of depression, chronic health, sleep disturbances, and lack of natural resources. Duration of problem: increased over the past 8 years; Severity of problem: moderate  GOALS ADDRESSED: Patient will: 1.  Reduce symptoms of: depression, insomnia, stress and loneliness.  2.  Increase knowledge and/or ability of: coping skills, healthy habits and stress reduction  3.  Demonstrate ability to: Increase healthy adjustment to current life circumstances and Increase adequate support systems for patient/family  INTERVENTIONS: Interventions utilized:  Motivational Interviewing, Behavioral Activation, Brief CBT, Supportive Counseling and  Sleep Hygiene Standardized Assessments completed: assessed for SI, HI, and self-harm.  ASSESSMENT: Patient currently experiencing moderate levels of depression. Patient reported that she has been depressed since she was in her 35's. Patient identified the cause is: "my life has not gone the way I wanted it to". Patient processed how she feels she has no control over her health issues, and she is unsure if her health issues can be addressed.  Patient is not suicidal, but she processed her plans for the end of her life.   Patient may benefit from counseling.  PLAN: 1. Follow up with behavioral health clinician on : two weeks.   Dessie Coma, [facility], [place]

## 2019-04-28 ENCOUNTER — Other Ambulatory Visit (HOSPITAL_COMMUNITY): Payer: Self-pay | Admitting: Cardiovascular Disease

## 2019-04-28 DIAGNOSIS — I739 Peripheral vascular disease, unspecified: Secondary | ICD-10-CM

## 2019-05-02 DIAGNOSIS — Z7901 Long term (current) use of anticoagulants: Secondary | ICD-10-CM | POA: Diagnosis not present

## 2019-05-02 DIAGNOSIS — I480 Paroxysmal atrial fibrillation: Secondary | ICD-10-CM | POA: Diagnosis not present

## 2019-05-03 ENCOUNTER — Telehealth: Payer: Self-pay | Admitting: Dietician

## 2019-05-03 NOTE — Telephone Encounter (Signed)
[name] called for CGM Changed sensor and had blood coming out of it. Called [employer], they told her to remove it and replace it.   She states blood sugar going high for unknown reasons. She denied need for assistance. She plans to work on that and scheduled a follow up for 2 weeks.

## 2019-05-03 NOTE — Telephone Encounter (Signed)
Dr. Elie Confer addressed this

## 2019-05-05 ENCOUNTER — Telehealth: Payer: Self-pay | Admitting: Dietician

## 2019-05-05 NOTE — Telephone Encounter (Signed)
Called for diabetes self management support. Wanted to know why her blood sugar are increasing at times. Suspect she needs small amounts of insulin for snacks. Suggested she consider inpen.

## 2019-05-09 ENCOUNTER — Telehealth: Payer: Self-pay | Admitting: Pharmacist

## 2019-05-09 NOTE — Telephone Encounter (Signed)
Patient texted me results of her FS POC PST INR 1.8 (goal 1.5 - 2.5) on 12.94m warfarin/wk. Denies any signs or symptoms of bleeding. Continue same regimen. Repeat PST FS POC INR in 1 week.

## 2019-05-10 ENCOUNTER — Other Ambulatory Visit: Payer: Self-pay

## 2019-05-10 ENCOUNTER — Ambulatory Visit (INDEPENDENT_AMBULATORY_CARE_PROVIDER_SITE_OTHER): Payer: HMO | Admitting: Licensed Clinical Social Worker

## 2019-05-10 DIAGNOSIS — F321 Major depressive disorder, single episode, moderate: Secondary | ICD-10-CM

## 2019-05-10 NOTE — BH Specialist Note (Signed)
Integrated Behavioral Health Visit via Telemedicine (Telephone)  05/10/2019 [name] [id number]   Session Start time: 12;03  Session End time: 12:33 Total time: 30 minutes  Referring Provider: Dr.Butcher Type of Visit: Telephonic Patient location: Home [facility] Provider location: Office All persons participating in visit: Patient and [facility]  Confirmed patient's address: Yes  Confirmed patient's phone number: Yes  Any changes to demographics: No   Discussed confidentiality: Yes    The following statements were read to the patient and/or legal guardian that are established with the [facility] Provider.  "The purpose of this phone visit is to provide behavioral health care while limiting exposure to the coronavirus (COVID19).  There is a possibility of technology failure and discussed alternative modes of communication if that failure occurs."  "By engaging in this telephone visit, you consent to the provision of healthcare.  Additionally, you authorize for your insurance to be billed for the services provided during this telephone visit."   Patient and/or legal guardian consented to telephone visit: Yes   PRESENTING CONCERNS: Patient and/or family reports the following symptoms/concerns:  memory issues, anxiety, moderate levels of depression, chronic health, sleep disturbances, and lack of natural resources. Duration of problem: increased over the past 8 years; Severity of problem: moderate  GOALS ADDRESSED: Patient will: 1.  Reduce symptoms of: depression, insomnia, stress and loneliness.  2.  Increase knowledge and/or ability of: coping skills, healthy habits and stress reduction  3.  Demonstrate ability to: Increase healthy adjustment to current life circumstances, Increase adequate support systems for patient/family and Increase motivation to adhere to plan of care  INTERVENTIONS: Interventions utilized:  Motivational Interviewing, Brief CBT and Supportive  Counseling Standardized Assessments completed: assessed for SI, HI, and self-harm.  ASSESSMENT: Patient currently experiencing moderate levels of depression. Patient is having some health issues, and she is going in for testing. Patient feels lonely, and processed how she works through her feelings of loneliness. Patient processed her pain levels, and feels this contributes to her limited mobility. Patient has very few friends, and she is not hopeful this will change. Pt denies any thoughts of hurting herself or others.    Patient may benefit from counseling.  PLAN: 1. Follow up with behavioral health clinician on : three weeks.   Dessie Coma, [facility], [place]

## 2019-05-11 ENCOUNTER — Telehealth: Payer: Self-pay | Admitting: *Deleted

## 2019-05-11 ENCOUNTER — Ambulatory Visit (HOSPITAL_BASED_OUTPATIENT_CLINIC_OR_DEPARTMENT_OTHER)
Admission: RE | Admit: 2019-05-11 | Discharge: 2019-05-11 | Disposition: A | Discharge status: Home / Self Care | Payer: HMO | Source: Ambulatory Visit | Attending: Internal Medicine | Admitting: Internal Medicine

## 2019-05-11 ENCOUNTER — Encounter: Payer: Self-pay | Admitting: Licensed Clinical Social Worker

## 2019-05-11 ENCOUNTER — Other Ambulatory Visit: Payer: Self-pay

## 2019-05-11 ENCOUNTER — Ambulatory Visit (HOSPITAL_COMMUNITY)
Admission: RE | Admit: 2019-05-11 | Discharge: 2019-05-11 | Disposition: A | Discharge status: Home / Self Care | Payer: HMO | Source: Ambulatory Visit | Attending: Internal Medicine | Admitting: Internal Medicine

## 2019-05-11 DIAGNOSIS — I739 Peripheral vascular disease, unspecified: Secondary | ICD-10-CM | POA: Insufficient documentation

## 2019-05-11 DIAGNOSIS — R0989 Other specified symptoms and signs involving the circulatory and respiratory systems: Secondary | ICD-10-CM

## 2019-05-11 DIAGNOSIS — R6889 Other general symptoms and signs: Secondary | ICD-10-CM

## 2019-05-11 NOTE — Telephone Encounter (Signed)
Pt calls and states she is concerned about the [place] studies she had done this am. States the girl that did them told her she might have some problems. States she had a nutrigrain bar for breakfast. When she came home she sat down in recliner and was having a cup of sugar free pudding and next thing she knew she was "coming too". States she feels fine now. She states she had taken 2 pain pills. She said her blood sugar alarm did go off that it was low. She was encouraged to eat a meal. She talked for 25 mins appt 4/1 at 915

## 2019-05-12 ENCOUNTER — Ambulatory Visit (INDEPENDENT_AMBULATORY_CARE_PROVIDER_SITE_OTHER): Payer: HMO | Admitting: Internal Medicine

## 2019-05-12 ENCOUNTER — Telehealth: Payer: Self-pay | Admitting: Cardiovascular Disease

## 2019-05-12 ENCOUNTER — Encounter: Payer: Self-pay | Admitting: Dietician

## 2019-05-12 ENCOUNTER — Encounter: Payer: Self-pay | Admitting: Internal Medicine

## 2019-05-12 ENCOUNTER — Telehealth: Payer: Self-pay | Admitting: Dietician

## 2019-05-12 ENCOUNTER — Ambulatory Visit (INDEPENDENT_AMBULATORY_CARE_PROVIDER_SITE_OTHER): Payer: HMO | Admitting: Dietician

## 2019-05-12 DIAGNOSIS — I739 Peripheral vascular disease, unspecified: Secondary | ICD-10-CM | POA: Diagnosis not present

## 2019-05-12 DIAGNOSIS — I480 Paroxysmal atrial fibrillation: Secondary | ICD-10-CM | POA: Diagnosis not present

## 2019-05-12 DIAGNOSIS — Z794 Long term (current) use of insulin: Secondary | ICD-10-CM | POA: Diagnosis not present

## 2019-05-12 DIAGNOSIS — Z9889 Other specified postprocedural states: Secondary | ICD-10-CM | POA: Diagnosis not present

## 2019-05-12 DIAGNOSIS — Z7901 Long term (current) use of anticoagulants: Secondary | ICD-10-CM

## 2019-05-12 DIAGNOSIS — F17200 Nicotine dependence, unspecified, uncomplicated: Secondary | ICD-10-CM | POA: Diagnosis not present

## 2019-05-12 DIAGNOSIS — Z8719 Personal history of other diseases of the digestive system: Secondary | ICD-10-CM | POA: Diagnosis not present

## 2019-05-12 DIAGNOSIS — R55 Syncope and collapse: Secondary | ICD-10-CM | POA: Diagnosis not present

## 2019-05-12 DIAGNOSIS — Z713 Dietary counseling and surveillance: Secondary | ICD-10-CM

## 2019-05-12 DIAGNOSIS — I10 Essential (primary) hypertension: Secondary | ICD-10-CM

## 2019-05-12 DIAGNOSIS — Z79899 Other long term (current) drug therapy: Secondary | ICD-10-CM

## 2019-05-12 DIAGNOSIS — E1140 Type 2 diabetes mellitus with diabetic neuropathy, unspecified: Secondary | ICD-10-CM

## 2019-05-12 DIAGNOSIS — Z951 Presence of aortocoronary bypass graft: Secondary | ICD-10-CM

## 2019-05-12 DIAGNOSIS — I252 Old myocardial infarction: Secondary | ICD-10-CM

## 2019-05-12 NOTE — Assessment & Plan Note (Signed)
This problem is chronic and stable.  Her most recent INR was 1.8 on March 29.  She currently is on 12.5 mg of warfarin weekly, managed by Dr. Elie Confer.  He left her dosage unchanged and will recheck an INR in 1 week.

## 2019-05-12 NOTE — Patient Instructions (Signed)
1. Please see me in 2-3 months 2. Work with Butch Penny to change your sliding scale and meter settings 3. I think your passing out was from low sugar 4. Follow up with Dr Gwenlyn Found about your leg studies

## 2019-05-12 NOTE — Progress Notes (Signed)
Medical Nutrition Therapy Start Time: 712 End time:  1015  Assessment:  Met with patient briefly today after downloading her CGM report and discussing it with Dr. Lynnae January. The report showed excellent control and 7 hypoglycemia events that patient was not aware of at times and was mostly after meals. She only takes prandial insulin. She is currently taking 5,10,15 or 20 units of insulin with meals. It was thought this may be too large a dose at times.   CGM Results:  Average is  143  for 14 days Glucose management indicator 6.7 % Time in range (70-180 mg/dL): 82 % (Goal >70%) Time High (181-250 mg/dL) 15 % (Goal < 25%) Time Very High (>250 mg/dl) 1 % (Goal < 5%) Time Low (54-69 mg/dL): 2 % (Goal is <4%) Time Very Low (<54) 0%  (Goal <1%) Coefficient of variation:27.2% (Goal is <36%)  Diagnosis: NB 1.2 Food and Nutrition related knowledge deficient as related to lack of insulin calculator apps as evidenced by  Her report.   Intervention: Her insulin dose was recalculated using 40 as her TDD obtained form her weight. Correction =45 and ICR =1:13, but she prefers to use 1:15. The following was provided to her and she was also educated about using the Insulincal App. She verbalized understanding of how to use it with several pretend scenarios.   Erik Obey-  1 unit rapid acting insulin for each 15 grams carb 70-99 =carb/food insulin  minus 1unit 100-145 =carb insulin plus 1 unit 146-190= carb insulin plus 2 units 191-235 = carb insulin plus 3 units 236-280 = carb insulin plus 4 units 281-325 = carb insulin plus 5 units 326-370 = carb insulin plus 6 units  Monitoring /Evalaution: Follow up is scheduled for next week to monitor understanding and correct use of the ne insulin doses and  events of hypoglycemia Debera Lat, RD 05/12/2019 5:05 PM.

## 2019-05-12 NOTE — Telephone Encounter (Signed)
Gricelda calls saying she "Passed out again this afternoon". She says she is not hurt. She initially said she did not think it was a low blood sugar. However, she did see a dip below 70 on her CGM graph, but states that the alarm for low blood sugar did not sound. We had reset it today to alarm at 90 rather than 85. She reported her blood sugar times and insulin doses  from this afternoon  1056 am-83 ate candy bar 2 units nov 1201 pm-  149 233 pm- 112 took 3 units insulin and ate  500 pm- 210  She calculated her dose using the insulincal app and used the scale provided today before taking the 3 units. I apologized for incorrect scale at her target range should have read zero not 1.  I reminded her that she took correction too soon after taking her first dose. She may want to consider the Inpen for safer prandial insulin dosing as we had briefly discussed today in the office. Use insulin calc no more than every 5 hours. Send corrected scale.  Check blood sugar before eating meals three times a day. If blood sugar is 70-99 - inject carb/food insulin  minus 1unit 100-145 - inject carb/food insulin plus 0 unit 146-190 - inject carb/food insulin plus 1 unit 191-235 - inject carb/food insulin plus 2 units 236-280 - inject carb/food insulin plus 3 units 281-325 - inject carb/food insulin plus 4 units 326-370 - inject carb/food insulin plus 5 units  I called [employer] about her alarm and they need her to call with her reader in hand. Darrelle was notified of this.

## 2019-05-12 NOTE — Progress Notes (Signed)
Subjective:    Patient ID: [name], female    DOB: 1950/09/12, 69 y.o.   MRN: [id number]  HPI  Sage Hammill is here for syncope. Please see the A&P for the status of the pt's chronic medical problems.  ROS : per ROS section and in problem oriented charting. All other systems are negative.  PMHx, Soc hx, and / or Fam hx : Had birthday party with daughter, GD, and GGD Esme and others.   Review of Systems  Cardiovascular: Negative for chest pain and palpitations.  Musculoskeletal: Positive for arthralgias, back pain, gait problem and myalgias.  Neurological: Positive for syncope.  Psychiatric/Behavioral: The patient is nervous/anxious.       Objective:   Physical Exam Constitutional:      General: She is not in acute distress.    Appearance: Normal appearance. She is not ill-appearing, toxic-appearing or diaphoretic.  HENT:     Head: Normocephalic and atraumatic.  Cardiovascular:     Rate and Rhythm: Normal rate and regular rhythm.  Skin:    General: Skin is warm and dry.  Neurological:     General: No focal deficit present.     Mental Status: She is alert. Mental status is at baseline.     Comments: Int resting tremor hands B  Psychiatric:        Mood and Affect: Mood normal.        Behavior: Behavior normal.        Thought Content: Thought content normal.        Judgment: Judgment normal.       Assessment & Plan:  Billing notes She is on warfarin, a drug therapy requiring intensive monitoring for toxicity especially in light of her PAD and history of GI bleeding.  She has one chronic illness, PAD, that poses a threat to life or bodily function.  She also has a chronic illness, diabetes, with side effects of her treatment -symptomatic hypoglycemia.  Additionally, I reviewed prior lab results, reviewed Dr. Naida Sleight notes, and interpreted her arterial ultrasound results.  I also discussed her diabetic management with Butch Penny.

## 2019-05-12 NOTE — Telephone Encounter (Addendum)
[name] called to be sure she was using the meal time insulin App and scale correctly. She is using it fine pr her report and was encouraged to continue with decreased doses per new calculations.

## 2019-05-12 NOTE — Telephone Encounter (Signed)
Called pt, she did not answer, went to vmail. Spoke w/ dr Software engineer, pt is to be advised to go to ED if this episode happens again

## 2019-05-12 NOTE — Assessment & Plan Note (Signed)
This problem is chronic and uncontrolled.  Her A1c trend was 7.1 in November and 5.4 in February.  At her February appointment, I noted that her CBG download showed 42% of values were within target and no hypoglycemic episodes.  Her average glucose was 156 and she had multiple readings above 180.  At that time, I felt she likely had significant and frequent hypoglycemic episodes.  She subsequently got a personal [employer] libre CGM.  Download today covered 14 days, from March 18 through March 31.  Her average glucose is 143, very high 1%, high 15%, target 82%, low 2% and very low 0%.  Her lows occur at mealtime, after using prandial insulin.  Her current regimen is Victoza 1.8 and NovoLog 15-24 times a day with meals.  Yesterday, she noted she was sitting in her chair taking 2 pills out of their containers.  She states that she felt fine but then had loss of consciousness for a couple of minutes.  Her CBG afterwards was 84.  She felt scared afterwards.  She denies any prior syncopal episodes.  She states that normally, if she sits idle, she will fall asleep but knows that she is going to fall asleep.  She states this episode was different.  Her CGM monitor over that time showed a steep downward trend from 190 down to 84 when this episode occurred right after lunch.  She states that she had used 10 units of insulin about 2 hours prior to the episode.  She does not describe a prodromal or significant symptoms afterwards but I think this is hypoglycemia due to the pattern of postmeal hypoglycemia, the steep downward trend on her CGM, and the timing of this episode after insulin usage.  I collaborated with Butch Penny, our diabetes educator, who was going to adjust her sliding scale insulin, encourage InPen or online carb counting app, and increase her lower alarm setting to 110.  Assessment: Poorly controlled diabetes with significant and frequent hypoglycemia postprandially due to mismatch between insulin use and oral  intake  Plan: Increase lower alarm setting to 110 Butch Penny has already provided a new sliding scale insulin Follow-up other recommendations from [facility]

## 2019-05-12 NOTE — Telephone Encounter (Signed)
New Message    Pt is calling and says she saw her pcp today who went over results with her from her tests  She said she is wondering what the next step will be and would like for Dr Rachel Bo nurse to call her     Please call

## 2019-05-12 NOTE — Telephone Encounter (Signed)
Pt states that she went to see her PCP today because she passed out yesterday and they asked her to come in for an appt to evaluate her. She states that they figured it out. She reports that while she was there today her PCP went over her lower extremity VAS ultra sounds that she had done and she wanted to know what the next steps were.  I notified pt that Dr.Berry had not yet reviewed and released his results to [place], but that as soon as he did we would contact her with the results. Pt thankful and verbalized understanding. Will route to MD.

## 2019-05-12 NOTE — Assessment & Plan Note (Addendum)
This problem is chronic and uncontrolled.  I thanked her for seeing Dr. Gwenlyn Found.  Dr. Alvester Chou ordered arterial ultrasound which showed 50 to 70% stenosis of the right proximal superficial femoral artery with three-vessel runoff.  There was 30 to 49% superficial femoral artery stenosis of the left with three-vessel runoff.  She had not told Dr. Gwenlyn Found about some of the exertional pain and weakness in her thighs as she had told me and I encouraged her to relay this to Dr. Gwenlyn Found.  Her other pain clearly is not related to PAD.  I had printed off a picture of the lower extremity arterial system and showed her where the blockages were.  We discussed medical management including continuing her rosuvastatin 20 mg, continuing good diabetic control, continuing good antihypertensive control, and stop smoking (she is not ready to do this and states that she will continue to smoke because it is one of the few things she has left to enjoy).  I noted that she had not been on aspirin since 2018.  She states they stopped it because of GI bleed but could never prove it was a GI bleed.  Chart review indicates that she was discharged from the hospital on April 25, 2016 after a non-STEMI and was prescribed aspirin 81 mg daily.  The aspirin was then removed from her medication list May 09, 2016 when she saw Dr. Aundra Dubin who noted that she was not anticoagulated for her paroxysmal atrial fibrillation due to a GI bleeding in February 2018.  Also indicated that she is not a good candidate for Eliquis or Xarelto and started her on warfarin with an INR goal of 1.5-2.5.  He indicated she could stop the aspirin when she started warfarin.  The history of GI bleeding was felt to be due to GI AVMs.  She did have a hospitalization in February 2018 and a capsule endoscopy did find an AVM.  EGD done during that hospitalization was normal.  She did require 2 units of PRBCs during that hospital stay.  She had a subsequent hospitalization in February 2019  requiring 2 units of PRBC for symptomatic anemia.  She has known arterial disease and is status post right CEA and CABG (ADDENDUM chart review 05/17/2019 2012 cath no CAD, 2018 minimal CAD, never had CABG).  She continues to smoke, is diabetic, and hypertensive.  She is on a high intensity statin but no antiplatelet agent.  Her highest blood score is 5, and sufficient data to calculate of bleeding risk.  However, she clearly is at risk for bleeding.  But she is also at risk due to her PAD as her feet are cold and DP pulses cannot be appreciated even though she has three-vessel runoff.  She is encouraged to start a baby aspirin 81 mg and she has already phone Dr. Naida Sleight office for further recommendations regarding her PAD.  PLAN: Follow-up Dr. Kennon Holter recommendations Aspirin 81 mg daily Continue Crestor 20 Patient counseled to quit smoking but not yet ready  Addendum: research shows adding ASA to warfarin offers no benefit to warfarin alone. Sent my chart message to stop ASA

## 2019-05-16 ENCOUNTER — Encounter: Payer: Self-pay | Admitting: Internal Medicine

## 2019-05-16 ENCOUNTER — Telehealth: Payer: Self-pay | Admitting: Pharmacist

## 2019-05-16 ENCOUNTER — Telehealth: Payer: Self-pay | Admitting: Dietician

## 2019-05-16 NOTE — Telephone Encounter (Signed)
This is a late entry for patient call on Thursday 05/12/19 around 4:30 PM. Patient reports passing out again this afternoon. She reports no injury. She states she had taken 2 units of insulin for a candy bar ~ 11 am and then 3 units about 2 PM for another meal. She used the scale and the App, the scale said 3 units the App suggested 2 units. She took 3 units. I noticed at that time that her scale was incorrect. I notified patient of this mistake and asked her to correct it on her scale and to use the App instead. I also reminded her that she should not  take correction insulin more often than every 5 hours. She verbalized understanding and said she may consider the Inpen and App. A corrected scale will be mailed to her on Monday with instructions to destroy the previous version.

## 2019-05-16 NOTE — Telephone Encounter (Signed)
thanks

## 2019-05-16 NOTE — Telephone Encounter (Signed)
Patient texted me results of PST FS POC INR 1.8 (target 1.5 - 2.5) on 12.10m warfarin/wk as: 0729mon Mondays; 1x2.29m6mablets on We/Th/Sa/Su; 1/2 x 2.29mg71m.229mg48m Tu/Fridays. Performs PST FS POC INRs weekly. No bleeding. No new medications.

## 2019-05-16 NOTE — Telephone Encounter (Signed)
Received fax from [facility] with INR results from 01/03/2019 to 05/16/2019. Last INR 1.8 on 05/16/2019. Results given to Dr. Elie Confer. Hubbard Hartshorn, BSN, RN-BC

## 2019-05-17 ENCOUNTER — Ambulatory Visit: Payer: HMO | Admitting: Dietician

## 2019-05-17 ENCOUNTER — Telehealth: Payer: Self-pay | Admitting: Cardiovascular Disease

## 2019-05-17 NOTE — Telephone Encounter (Signed)
Patient calling in regards to a call about her results last night. She states to forget about what was talked about last night and she will come back in a year. She states she does not need a call back.

## 2019-05-17 NOTE — Telephone Encounter (Signed)
Called to follow up on changes to CGM alarms and insulin calc settings.   CGM upload did not show hypoglycemia on 4/1 and did show a dip in blood sugar that came close to 33m/dl about 11:40 AM. CBG results show slightly increased average blood sugars since 4/1 ( was averaging 125 -150 and now is averaging 160-174 with no hypoglycemia. She is using insulin calc app blood sugars are creeping higher each day. Her  carb to inulin ratio was adjusted to 1:13 per her weight based calculations in her settings. She verbalized good comprehension to carb counting and using the insulin calc app by giving an example of how she decided how much insulin to take for her breakfast today. She states she has treatment for hypoglycemia on hand.  She states the alarms are not bothering her as much since being set at higher levels. Plan to follow up in 2 weeks or sooner if needed.  DButch PennyPlyler, RD 05/17/2019 11:06 AM.   .

## 2019-05-17 NOTE — Telephone Encounter (Signed)
Sent to primary to make aware.  (in regards to results reported on 4/6)

## 2019-05-18 NOTE — Telephone Encounter (Signed)
Answered questions about rotating sites for CGM sensor.

## 2019-05-20 ENCOUNTER — Encounter (HOSPITAL_COMMUNITY): Payer: HMO | Admitting: Cardiology

## 2019-05-23 ENCOUNTER — Telehealth: Payer: Self-pay | Admitting: Internal Medicine

## 2019-05-23 ENCOUNTER — Encounter (HOSPITAL_COMMUNITY): Payer: Self-pay | Admitting: Cardiology

## 2019-05-23 ENCOUNTER — Ambulatory Visit (HOSPITAL_COMMUNITY)
Admission: RE | Admit: 2019-05-23 | Discharge: 2019-05-23 | Disposition: A | Discharge status: Home / Self Care | Payer: HMO | Source: Ambulatory Visit | Attending: Cardiology | Admitting: Cardiology

## 2019-05-23 ENCOUNTER — Other Ambulatory Visit: Payer: Self-pay

## 2019-05-23 ENCOUNTER — Telehealth: Payer: Self-pay | Admitting: Pharmacist

## 2019-05-23 VITALS — BP 130/50 | HR 85 | Wt 173.6 lb

## 2019-05-23 DIAGNOSIS — I5032 Chronic diastolic (congestive) heart failure: Secondary | ICD-10-CM

## 2019-05-23 DIAGNOSIS — Z794 Long term (current) use of insulin: Secondary | ICD-10-CM | POA: Diagnosis not present

## 2019-05-23 DIAGNOSIS — I480 Paroxysmal atrial fibrillation: Secondary | ICD-10-CM | POA: Insufficient documentation

## 2019-05-23 DIAGNOSIS — E1140 Type 2 diabetes mellitus with diabetic neuropathy, unspecified: Secondary | ICD-10-CM | POA: Insufficient documentation

## 2019-05-23 DIAGNOSIS — J449 Chronic obstructive pulmonary disease, unspecified: Secondary | ICD-10-CM | POA: Diagnosis not present

## 2019-05-23 DIAGNOSIS — I2510 Atherosclerotic heart disease of native coronary artery without angina pectoris: Secondary | ICD-10-CM | POA: Diagnosis not present

## 2019-05-23 DIAGNOSIS — Z8249 Family history of ischemic heart disease and other diseases of the circulatory system: Secondary | ICD-10-CM | POA: Insufficient documentation

## 2019-05-23 DIAGNOSIS — Z79899 Other long term (current) drug therapy: Secondary | ICD-10-CM | POA: Insufficient documentation

## 2019-05-23 DIAGNOSIS — I272 Pulmonary hypertension, unspecified: Secondary | ICD-10-CM | POA: Insufficient documentation

## 2019-05-23 DIAGNOSIS — G4733 Obstructive sleep apnea (adult) (pediatric): Secondary | ICD-10-CM | POA: Diagnosis not present

## 2019-05-23 DIAGNOSIS — I2722 Pulmonary hypertension due to left heart disease: Secondary | ICD-10-CM

## 2019-05-23 DIAGNOSIS — Z953 Presence of xenogenic heart valve: Secondary | ICD-10-CM | POA: Insufficient documentation

## 2019-05-23 DIAGNOSIS — Z7951 Long term (current) use of inhaled steroids: Secondary | ICD-10-CM | POA: Insufficient documentation

## 2019-05-23 DIAGNOSIS — G8929 Other chronic pain: Secondary | ICD-10-CM

## 2019-05-23 DIAGNOSIS — E785 Hyperlipidemia, unspecified: Secondary | ICD-10-CM | POA: Diagnosis not present

## 2019-05-23 DIAGNOSIS — F17200 Nicotine dependence, unspecified, uncomplicated: Secondary | ICD-10-CM | POA: Insufficient documentation

## 2019-05-23 DIAGNOSIS — Z7901 Long term (current) use of anticoagulants: Secondary | ICD-10-CM | POA: Insufficient documentation

## 2019-05-23 DIAGNOSIS — M545 Low back pain, unspecified: Secondary | ICD-10-CM

## 2019-05-23 LAB — PROTIME-INR
INR: 1.3 — ABNORMAL HIGH (ref 0.8–1.2)
Prothrombin Time: 16.2 seconds — ABNORMAL HIGH (ref 11.4–15.2)

## 2019-05-23 LAB — BASIC METABOLIC PANEL
Anion gap: 10 (ref 5–15)
BUN: 15 mg/dL (ref 8–23)
CO2: 26 mmol/L (ref 22–32)
Calcium: 9.1 mg/dL (ref 8.9–10.3)
Chloride: 99 mmol/L (ref 98–111)
Creatinine, Ser: 0.83 mg/dL (ref 0.44–1.00)
GFR calc Af Amer: >60 mL/min (ref >60)
GFR calc non Af Amer: >60 mL/min (ref >60)
Glucose, Bld: 225 mg/dL — ABNORMAL HIGH (ref 70–99)
Potassium: 4 mmol/L (ref 3.5–5.1)
Sodium: 135 mmol/L (ref 135–145)

## 2019-05-23 LAB — CBC
HCT: 37 % (ref 36.0–46.0)
Hemoglobin: 11.6 g/dL — ABNORMAL LOW (ref 12.0–15.0)
MCH: 28.9 pg (ref 26.0–34.0)
MCHC: 31.4 g/dL (ref 30.0–36.0)
MCV: 92.3 fL (ref 80.0–100.0)
Platelets: 196 10*3/uL (ref 150–400)
RBC: 4.01 MIL/uL (ref 3.87–5.11)
RDW: 16.4 % — ABNORMAL HIGH (ref 11.5–15.5)
WBC: 6.7 10*3/uL (ref 4.0–10.5)
nRBC: 0 % (ref 0.0–0.2)

## 2019-05-23 MED ORDER — HYDROCODONE-ACETAMINOPHEN 5-325 MG PO TABS: 2.0000 | ORAL_TABLET | Freq: Four times a day (QID) | ORAL | 0 refills | Status: DC | PRN

## 2019-05-23 NOTE — Telephone Encounter (Signed)
Texted by patient reporting PST FS POC INR 1.5 on 34m warfarin/wk. Target goal established, patient specific is 1.5 - 2.5. Advised to OMIT on Mondays (HER preference); 1x2.559mwarfarin on Tu/We/Th;Sa/Su; 1/2 x 2.35m23m1.235m59mn Fridays. Repeat sequence each week. Repeat FS POC PST INR on two weeks (19-APR-21) and report value to me. Denies any symptoms of bleeding or embolic event(s).

## 2019-05-23 NOTE — Telephone Encounter (Signed)
Pt is requesting a call back.

## 2019-05-23 NOTE — Telephone Encounter (Signed)
done

## 2019-05-23 NOTE — Progress Notes (Signed)
PCP: Bartholomew Crews, MD HF Cardiology: Dr. Aundra Dubin  69 y.o. with COPD, mitral stenosis s/p bioprosthetic MV replacement, paroxysmal atrial fibrillation, GI bleeding, and pulmonary hypertension/RV failure presents for cardiology followup.  She was admitted with GI bleeding, thought to be AVMs, in 2/18 and warfarin was stopped.  She was admitted in 3/18 with chest pain, dyspnea, and elevated troponin.  She had not been taking her Lasix regularly.  Coronary angiography showed nonobstructive CAD, suspect demand ischemia. RHC showed elevated right and left heart filling pressures and severe pulmonary hypertension.  V/Q scan showed no chronic PE, and PFTs showed severe obstructive lung disease.  Echo showed severe RV dilation with mildly decreased systolic function.  She was diuresed and eventually discharged to [facility].   She was admitted in 2/19 with anemia, got 2 units PRBCs.    Echo in 5/19 with EF 60-65%, mildly decreased RV systolic function, PASP 49 mmHg, normal bioprosthetic mitral valve.   She returns for followup of diastolic CHF.  She uses a walker.  She has bilateral foot and lower leg pain, not exertional.  PAD noted on 3/31 ABIs.  She saw Dr. Gwenlyn Found and medical management was recommended.  She is not very active, limited by dyspnea from COPD + CHF.  She is short of breath walking around the house, not short of breath at rest.  No chest pain.  No PND.  No BRBPR/melena.  Weight is down 4 lbs. She still smokes.   Labs (3/18): K 4.8 => 3.9, creatinine 1.2 => 1.09, hgb 10.2 => 10.7, BNP 131 Labs (5/18): K 4, creatinine 1.0 Labs (2/19): K 4.5, creatinine 0.94, transferrin saturation 3%.  Labs (3/19): hgb 11.3 Labs (4/19): K 4.1, creatinine 1.0, LDL 36 Labs (1/10): creatinine 0.85 Labs (7/20): hgb 10.7  Labs (10/20): K 4, creatinine 1.13, LDL 48 Labs (11/20): hgb 9.2 Labs (1/21): K 4.2, creatinine 1.28 Labs (2/21): hgb 10.7  PMH: 1. Mitral stenosis: s/p bioprosthetic mitral valve  replacement.  2. COPD: Severe by 3/18 PFTs.  She is still smoking.  3. Atrial fibrillation: Paroxysmal. Has been off warfarin since GI bleed in 2/18.  4. OSA: Uses CPAP.  5. Type II diabetes. 6. Pulmonary hypertension/RV failure: Suspect primarily group 3 PH due to underlying lung disease (COPD).  - RHC (3/18) with mean RA 14, PA 77/28 mean 47, mean PCWP 23, CI 2.6, PVR 4.9.  - Echo (3/18): EF 60-65%, severe RV dilation with mildly decreased RV systolic function, PASP 76 mmHg.  Bioprosthetic MV looks ok.  - V/Q scan (3/18): No evidence for chronic PE.  - PFTs (3/18): Severe obstructive airways disease.  - Echo in 5/19 with EF 60-65%, mildly decreased RV systolic function, PASP 49 mmHg, normal bioprosthetic mitral valve.  7. Anemia: Fe deficiency and anemia of chronic disease.   - Suspect GI AVMs as cause of 2/18 bleed . Coumadin stopped.  8. CAD: LHC (3/18) with 30% LM, 40% proximal RCA.  9. Hyperlipidemia 10. PAD: ABIs 3/21 with 0.85 R, 1.03 L. 50-74% right SFA stenosis, 30-49% left SFA stenosis.  11. Carotid stenosis: Carotid dopplers (3/21) with 40-59% BICA.   SH: Lives alone in [place].  Active smoker.  No ETOH.   Family History  Problem Relation Age of Onset  . Peptic Ulcer Disease Father   . Heart attack Father 12       Died of MI at age 50  . Heart attack Brother 55       Died of MI at  age 76  . Obesity Brother   . Pneumonia Mother   . Healthy Sister   . Lupus Daughter   . Obsessive Compulsive Disorder Daughter    ROS: All systems reviewed and negative except as per HPI.   Current Outpatient Medications  Medication Sig Dispense Refill  . albuterol (PROAIR HFA) 108 (90 Base) MCG/ACT inhaler Inhale 2 puffs into the lungs every 6 (six) hours as needed for shortness of breath. 18 g 5  . ALPRAZolam (XANAX) 1 MG tablet TAKE 1 TABLET BY MOUTH EVERY NIGHT AT BEDTIME AS NEEDED FOR INSOMNIA. MAY TAKE 1/2 TABLET BY MOUTH DURING THE DAY FOR ANXIETY 55 tablet 4  . benzonatate  (TESSALON) 100 MG capsule Take 1 capsule (100 mg total) by mouth 3 (three) times daily as needed for cough. 90 capsule 11  . Blood Glucose Monitoring Suppl (ONETOUCH VERIO) W/DEVICE KIT 1 each by Does not apply route 4 (four) times daily. 1 kit 0  . buPROPion (WELLBUTRIN) 75 MG tablet Take 1 tablet (75 mg total) by mouth 2 (two) times daily. 180 tablet 3  . chlorpheniramine (CHLOR-TRIMETON) 4 MG tablet Take 4 mg by mouth daily as needed for allergies.    . clobetasol cream (TEMOVATE) 0.22 % Apply 1 application topically 2 (two) times daily as needed (lichen sclerosus flare). 45 g 3  . Continuous Blood Gluc Sensor (FREESTYLE LIBRE 2 SENSOR) MISC 1 each by Does not apply route 6 (six) times daily. To read 6 times daily with change of sensor every 2 weeks, 90 days worth with 3 refills 7 each 3  . DULoxetine (CYMBALTA) 60 MG capsule TAKE 1 CAPSULE BY MOUTH 2 TIMES A DAY 180 capsule 3  . ferrous gluconate (FERGON) 324 MG tablet Take 1 tablet (324 mg total) by mouth 2 (two) times daily with a meal. (Patient taking differently: Take 648 mg by mouth See admin instructions. Take 2 tablets (648 mg) by mouth daily with lunch or supper) 180 tablet 3  . fluticasone (FLONASE) 50 MCG/ACT nasal spray Place 2 sprays into both nostrils daily. 16 g 11  . Fluticasone-Salmeterol (ADVAIR) 500-50 MCG/DOSE AEPB INHALE 1 PUFF INTO THE LUNGS TWICE DAILY 180 each 3  . furosemide (LASIX) 80 MG tablet TAKE 1 TABLET BY MOUTH EVERY MORNING AND TAKE 1/2 TABLET EVERY EVENING 45 tablet 2  . gabapentin (NEURONTIN) 300 MG capsule Take 2 capsules (600 mg total) by mouth 3 (three) times daily. 540 capsule 3  . glucose blood (ONETOUCH VERIO) test strip Use to check blood sugar 4 times daily. diag code E11.40. Insulin dependent 375 each 3  . hydrocortisone cream 1 % APPLY EXTERNALLY TO THE AFFECTED AREA TWICE DAILY (Patient taking differently: APPLY EXTERNALLY TO THE AFFECTED AREA TWICE DAILY prn) 28.35 g 2  . insulin aspart (NOVOLOG  FLEXPEN) 100 UNIT/ML FlexPen INJECT 5 UNITS TO 20 UNITS SUBCUTANEOUSLY FOUR TIMES A DAY BEFORE MEALS AND AT BEDTIME 15 mL 5  . Insulin Pen Needle 32G X 4 MM MISC Use to inject insulin up to 6 times a day 500 each 3  . Insulin Syringe-Needle U-100 31G X 15/64" 0.5 ML MISC Use to inject insulin up to 4 times a day 400 each 3  . ipratropium-albuterol (DUONEB) 0.5-2.5 (3) MG/3ML SOLN INHALE THE CONTENTS OF 1 VIAL VIA NEBULIZER EVERY 6 HOURS AS NEEDED FOR SHORTNESS OF BREATH 360 mL 3  . Lancets Misc. (ACCU-CHEK FASTCLIX LANCET) KIT Check your blood 4 times a day dx code 250.00 insulin requiring 1 kit  2  . metoprolol succinate (TOPROL-XL) 50 MG 24 hr tablet Take 1 tablet (50 mg total) by mouth daily. Take with or immediately following a meal. 90 tablet 3  . nystatin cream (MYCOSTATIN) Apply 1 application topically 2 (two) times daily as needed (rash).   3  . omeprazole (PRILOSEC) 40 MG capsule Take 1 capsule (40 mg total) by mouth 2 (two) times daily. 180 capsule 3  . ondansetron (ZOFRAN) 4 MG tablet TAKE 1 TABLET BY MOUTH EVERY 8 HOURS AS NEEDED FOR NAUSEA OR VOMITING 20 tablet 2  . OXYGEN Inhale 2 L into the lungs as needed (for shortness of breath while active).     . potassium chloride SA (KLOR-CON) 20 MEQ tablet TAKE 2 TABLETS BY MOUTH EVERY MORNING AND TAKE 1 TABLET BY MOUTH EVERY EVENING 90 tablet 1  . PRESCRIPTION MEDICATION Inhale into the lungs at bedtime. CPAP    . rosuvastatin (CRESTOR) 20 MG tablet Take 1 tablet (20 mg total) by mouth at bedtime. 90 tablet 3  . Tiotropium Bromide Monohydrate (SPIRIVA RESPIMAT) 2.5 MCG/ACT AERS Inhale 2 puffs into the lungs daily. 1 Inhaler 11  . VICTOZA 18 MG/3ML SOPN INJECT 1.8MG SUBCUTANEOUSLY DAILY 18 mL 3  . warfarin (COUMADIN) 2.5 MG tablet 2.62m warfarin every day except on Mondays - take NO TABLETS and on Tuesdays take ONLY 1/2 pill 90 tablet 1  . HYDROcodone-acetaminophen (NORCO/VICODIN) 5-325 MG tablet Take 2 tablets by mouth every 6 (six) hours as  needed for severe pain. 114 tablet 0   Current Facility-Administered Medications  Medication Dose Route Frequency Provider Last Rate Last Admin  . senna-docusate (Senokot-S) tablet 1 tablet  1 tablet Oral QHS PRN Hoffman, Jessica Ratliff, DO       BP (!) 130/50   Pulse 85   Wt 78.7 kg (173 lb 9.6 oz)   SpO2 96%   BMI 31.75 kg/m  General: NAD Neck: No JVD, no thyromegaly or thyroid nodule.  Lungs: Distant BS. CV: Nondisplaced PMI.  Heart regular S1/S2, no S3/S4, no murmur.  No peripheral edema.  No carotid bruit.  Normal pedal pulses.  Abdomen: Soft, nontender, no hepatosplenomegaly, no distention.  Skin: Intact without lesions or rashes.  Neurologic: Alert and oriented x 3.  Psych: Normal affect. Extremities: No clubbing or cyanosis.  HEENT: Normal.   Assessment/Plan: 1. Pulmonary hypertension: I suspect that this is primarily group 2 PH (pulmonary venous hypertension from LV diastolic dysfunction) and group 3 PH (severe COPD, OSA).  V/Q scan showed no evidence for chronic PE.  I do not think that she will benefit from selective pulmonary vasodilators.  I think that the key here will continue to be good diuresis + use of oxygen and CPAP.  2. Chronic diastolic CHF with prominent RV dysfunction in the setting of severe COPD.  NYHA class III symptoms (dyspnea is likely a combination of CHF and COPD), stable recently.  She does not look volume overloaded on exam.  - Continue Lasix 80 qam/40 qpm.  BMET today.   - With diastolic CHF and neuropathy, I will workup amyloidosis with myeloma panel, urine immunofixation, and PYP scan.  3. Atrial fibrillation: Paroxysmal.  She is in NSR today.  She had been off anticoagulation due to GI bleeding, now back on warfarin with INR goal 1.5-2.5.  She had GI bleeding in 2/19, no recent melena.   - Keep warfarin with INR 1.5-2.5 for now.  - CBC today. - With most recent data regarding NOACs and bioprosthetic valves, I  suggested in the past that she switch  to apixaban given GI bleeding history. She has wanted to stay on warfarin.  4. h/o GI bleeding: Possible GI AVM. Admitted with anemia again in 2/19, got 2 units PRBCs.  No overt bleeding now.  - CBC today.  5. COPD: Severe by PFTs. Unfortunately, she is still smoking.  I encouraged her to quit again.  6. CAD: Nonobstructive. She is on a statin.  - Good lipids in 10/20.  7. PAD: Foot and leg pain bilaterally seems more likely to be neuropathy than due to PAD.    Followup with NP/PA in 4 months.    Loralie Champagne 05/23/2019

## 2019-05-23 NOTE — Patient Instructions (Addendum)
NO medication changes today!   Labs today We will only contact you if something comes back abnormal or we need to make some changes. Otherwise no news is good news!   You have been ordered a PYP Scan.  This is done in the Radiology Department of [facility].  When you come for this test please plan to be there 2-3 hours.  You will be called to schedule this appointment   Your physician recommends that you schedule a follow-up appointment in: 4 months with the Nurse Practitioner. We will call you to schedule this appointment.    Please call office at [phone] option 2 if you have any questions or concerns.   At the [facility], you and your health needs are our priority. As part of our continuing mission to provide you with exceptional heart care, we have created designated Provider Care Teams. These Care Teams include your primary Cardiologist (physician) and Advanced Practice Providers (APPs- Physician Assistants and Nurse Practitioners) who all work together to provide you with the care you need, when you need it.   You may see any of the following providers on your designated Care Team at your next follow up: Marland Kitchen Dr Glori Bickers . Dr Loralie Champagne . Darrick Grinder, NP . Lyda Jester, PA . Audry Riles, PharmD   Please be sure to bring in all your medications bottles to every appointment.

## 2019-05-24 ENCOUNTER — Telehealth: Payer: Self-pay | Admitting: Cardiovascular Disease

## 2019-05-24 ENCOUNTER — Telehealth (HOSPITAL_COMMUNITY): Payer: Self-pay | Admitting: Vascular Surgery

## 2019-05-24 NOTE — Telephone Encounter (Signed)
See 4/13 telephone note.

## 2019-05-24 NOTE — Telephone Encounter (Signed)
Returned call to patient she stated she wants to cancel appointment scheduled with [facility] 4/21 for pain in both legs.Stated she saw Dr.McLean yesterday and he advised to have a PYP scan.She is scheduled to have done 4/27. Stated she will call back if needed.

## 2019-05-24 NOTE — Telephone Encounter (Signed)
[name] is calling requesting to speak with Dr. Kennon Holter nurse about something. She did not specify what it is in regards to. Please advise.

## 2019-05-24 NOTE — Telephone Encounter (Signed)
Called pt who report she spoke to [place] in regards to test results and would like to speak to her to discuss further. Nurse offered to assist pt but pt state she doesn't feel like repeating the information and would prefer to speak with Malachy Mood only.  Will route message.

## 2019-05-24 NOTE — Telephone Encounter (Signed)
Left pt message giving pyp scan appt/ asked pt to call back to confirm appt

## 2019-05-26 LAB — MULTIPLE MYELOMA PANEL, SERUM
Albumin SerPl Elph-Mcnc: 3.5 g/dL (ref 2.9–4.4)
Albumin/Glob SerPl: 1.3 (ref 0.7–1.7)
Alpha 1: 0.2 g/dL (ref 0.0–0.4)
Alpha2 Glob SerPl Elph-Mcnc: 0.9 g/dL (ref 0.4–1.0)
B-Globulin SerPl Elph-Mcnc: 1 g/dL (ref 0.7–1.3)
Gamma Glob SerPl Elph-Mcnc: 0.7 g/dL (ref 0.4–1.8)
Globulin, Total: 2.7 g/dL (ref 2.2–3.9)
IgA: 173 mg/dL (ref 87–352)
IgG (Immunoglobin G), Serum: 777 mg/dL (ref 586–1602)
IgM (Immunoglobulin M), Srm: 82 mg/dL (ref 26–217)
Total Protein ELP: 6.2 g/dL (ref 6.0–8.5)

## 2019-05-27 ENCOUNTER — Encounter: Payer: Self-pay | Admitting: Podiatry

## 2019-05-27 ENCOUNTER — Other Ambulatory Visit: Payer: Self-pay

## 2019-05-27 ENCOUNTER — Ambulatory Visit (INDEPENDENT_AMBULATORY_CARE_PROVIDER_SITE_OTHER): Payer: HMO | Admitting: Podiatry

## 2019-05-27 VITALS — BP 125/64 | HR 72 | Temp 97.3°F

## 2019-05-27 DIAGNOSIS — M79674 Pain in right toe(s): Secondary | ICD-10-CM

## 2019-05-27 DIAGNOSIS — B351 Tinea unguium: Secondary | ICD-10-CM | POA: Diagnosis not present

## 2019-05-27 DIAGNOSIS — D369 Benign neoplasm, unspecified site: Secondary | ICD-10-CM | POA: Diagnosis not present

## 2019-05-27 DIAGNOSIS — M79675 Pain in left toe(s): Secondary | ICD-10-CM | POA: Diagnosis not present

## 2019-05-27 NOTE — Progress Notes (Signed)
This patient returns to my office for at risk foot care.  This patient requires this care by a professional since this patient will be at risk due to having coagulation defect,  PAD and diabetic with neuropathy. Patient says she has been attempting to trim her nails herself at home.  This patient is unable to cut nails herself since the patient cannot reach her nails.These nails are painful walking and wearing shoes.  This patient presents for at risk foot care today.  General Appearance  Alert, conversant and in no acute stress.  Vascular  Dorsalis pedis and posterior tibial  pulses are not  palpable  bilaterally.  Capillary return is within normal limits  bilaterally. Temperature is within normal limits  Bilaterally. Purplish feet  B/L.    Neurologic  Senn-Weinstein monofilament wire test within normal limits  bilaterally. Muscle power within normal limits bilaterally.  Nails Thick disfigured discolored nails with subungual debris  from hallux to fifth toes bilaterally. No evidence of bacterial infection or drainage bilaterally.  Orthopedic  No limitations of motion  feet .  No crepitus or effusions noted.  No bony pathology or digital deformities noted. HAV  B/L.  Midfoot DJD  B/L.  Skin  normotropic skin with no porokeratosis noted bilaterally.  No signs of infections or ulcers noted.     Onychomycosis  Pain in right toes  Pain in left toes  Consent was obtained for treatment procedures.   Mechanical debridement of nails 1-5  bilaterally performed with a nail nipper.  Filed with dremel without incident.    Return office visit   3 months                  Told patient to return for periodic foot care and evaluation due to potential at risk complications.   Gardiner Barefoot DPM

## 2019-05-30 ENCOUNTER — Telehealth: Payer: Self-pay | Admitting: Pharmacist

## 2019-05-30 DIAGNOSIS — I480 Paroxysmal atrial fibrillation: Secondary | ICD-10-CM | POA: Diagnosis not present

## 2019-05-30 DIAGNOSIS — Z7901 Long term (current) use of anticoagulants: Secondary | ICD-10-CM | POA: Diagnosis not present

## 2019-05-30 NOTE — Telephone Encounter (Signed)
Patient texted results of FS POC PST INR 1.9 (target 1.5 - 2.5) on 13.926m warfarin/wk (as: 1x2.511mon Su/Tu/We/Th/Sa; 26m93mn Mondays; 1/2 x 2.5mg72m.25mg75m Fridays. No bleeding. Was instructed to continue this same regimen and repeat PST FS POC INR on 26-APR-21, text results to me.

## 2019-05-31 ENCOUNTER — Ambulatory Visit: Payer: HMO | Admitting: Cardiovascular Disease

## 2019-06-01 ENCOUNTER — Other Ambulatory Visit: Payer: Self-pay

## 2019-06-01 ENCOUNTER — Ambulatory Visit (INDEPENDENT_AMBULATORY_CARE_PROVIDER_SITE_OTHER): Payer: HMO | Admitting: Licensed Clinical Social Worker

## 2019-06-01 ENCOUNTER — Encounter: Payer: Self-pay | Admitting: Licensed Clinical Social Worker

## 2019-06-01 ENCOUNTER — Other Ambulatory Visit: Payer: Self-pay | Admitting: Internal Medicine

## 2019-06-01 ENCOUNTER — Other Ambulatory Visit (HOSPITAL_COMMUNITY): Payer: Self-pay | Admitting: Internal Medicine

## 2019-06-01 ENCOUNTER — Ambulatory Visit: Payer: HMO | Admitting: Cardiovascular Disease

## 2019-06-01 DIAGNOSIS — J439 Emphysema, unspecified: Secondary | ICD-10-CM

## 2019-06-01 DIAGNOSIS — N904 Leukoplakia of vulva: Secondary | ICD-10-CM

## 2019-06-01 DIAGNOSIS — F321 Major depressive disorder, single episode, moderate: Secondary | ICD-10-CM

## 2019-06-01 DIAGNOSIS — I5032 Chronic diastolic (congestive) heart failure: Secondary | ICD-10-CM

## 2019-06-01 DIAGNOSIS — J301 Allergic rhinitis due to pollen: Secondary | ICD-10-CM

## 2019-06-01 DIAGNOSIS — K219 Gastro-esophageal reflux disease without esophagitis: Secondary | ICD-10-CM

## 2019-06-01 NOTE — BH Specialist Note (Signed)
Integrated Behavioral Health Visit via Telemedicine (Telephone)  06/01/2019 [name] [id number]   Session Start time: 12:34  Session End time: 1:04 Total time: 30 minutes  Referring Provider: Dr. Lynnae January Type of Visit: Telephonic Patient location: Home [facility] Provider location: Office All persons participating in visit: Patient, [facility], and [facility] intern  Confirmed patient's address: Yes  Confirmed patient's phone number: Yes  Any changes to demographics: No   Discussed confidentiality: Yes    The following statements were read to the patient and/or legal guardian that are established with the [facility] Provider.  "The purpose of this phone visit is to provide behavioral health care while limiting exposure to the coronavirus (COVID19).  There is a possibility of technology failure and discussed alternative modes of communication if that failure occurs."  "By engaging in this telephone visit, you consent to the provision of healthcare.  Additionally, you authorize for your insurance to be billed for the services provided during this telephone visit."   Patient and/or legal guardian consented to telephone visit: Yes   PRESENTING CONCERNS: Patient and/or family reports the following symptoms/concerns: memory issues, anxiety, moderate levels of depression, chronic health,sleep disturbances,and lack of natural resources. Duration of problem: increased over the past 8 years; Severity of problem: moderate  GOALS ADDRESSED: Patient will: 1.  Reduce symptoms of: depression, insomnia, stress and loneliness.  2.  Increase knowledge and/or ability of: coping skills, healthy habits and stress reduction  3.  Demonstrate ability to: Increase healthy adjustment to current life circumstances, Increase adequate support systems for patient/family and Increase motivation to adhere to plan of care  INTERVENTIONS: Interventions utilized:  Motivational Interviewing, Brief CBT and  Supportive Counseling Standardized Assessments completed: Not Needed  ASSESSMENT: Patient currently experiencing moderate levels of depression. Patient reported 'ups and downs'. Patient reported the only way for her to experience ups is speaking with her grand-daughter. Patient has learned some recent health challenges, and she is unsure if they can be resolved. Patient is in pain. Daily routine: gets out of bed, goes to chair, takes medication, waits 45 minutes.  Patient reported going days without talking to a person. Patient does not share with her family her thoughts/feelings because they would not care. Patient has lost interest in activities.   Patient reported that she does not engage in activities the way she used to because of pain and fall risk. Patient denies wanting a nurse aid.   Patient may benefit from counseling.  PLAN: 1. Follow up with behavioral health clinician on : two weeks.  Dessie Coma, [facility], [place]

## 2019-06-02 ENCOUNTER — Ambulatory Visit: Payer: HMO | Admitting: Dietician

## 2019-06-03 ENCOUNTER — Telehealth: Payer: Self-pay | Admitting: Dietician

## 2019-06-03 ENCOUNTER — Ambulatory Visit: Payer: HMO | Admitting: Dietician

## 2019-06-03 NOTE — Telephone Encounter (Signed)
Patient called to discuss coffee's affect n blood sugar and how to cover with insulin.

## 2019-06-03 NOTE — Progress Notes (Signed)
Blood sugars have increased this 2 week period to 185 on average (44% in target, 56% high and 6% very high) with no hypoglycemia from 153 on average  with 3 hypoglycemia events the previous 2 weeks period. This is above [name]'s goal for her blood sugars:  130-180 is her goal. We reviewed how she is counting carbs and using the insulin calc app. She is increasing the amount of insulin the app is tell ing her to take by a few units at times. She is taking about 20+/-5 units a day meal and correction insulin.  Changes agreed upon today are : Take 2 units per cup of coffee; more insulin for food:decreased ICR to 1:11 . Follow u7p in 2 weeks, Benny to contact [place] if appointment needs to be sooner.  [name], RD 06/03/2019 10:12 AM.

## 2019-06-06 ENCOUNTER — Telehealth: Payer: Self-pay | Admitting: Dietician

## 2019-06-06 ENCOUNTER — Telehealth: Payer: Self-pay | Admitting: Internal Medicine

## 2019-06-06 DIAGNOSIS — G4733 Obstructive sleep apnea (adult) (pediatric): Secondary | ICD-10-CM

## 2019-06-06 NOTE — Telephone Encounter (Signed)
lab

## 2019-06-06 NOTE — Telephone Encounter (Signed)
pls contact pt [phone]

## 2019-06-06 NOTE — Telephone Encounter (Signed)
Pt calls and states she needs new CPAP machine, needs sleep study, she states she was referred for one and cancelled the appt but now her machine is not working, she has had it for well over 5 years

## 2019-06-06 NOTE — Telephone Encounter (Signed)
Does she want to try in home study or in lab?

## 2019-06-06 NOTE — Telephone Encounter (Signed)
Per patient her Blood sugars have been increasing using the insulin calc settings so she went back to her old way of dosing her insulin yesterday and took a larger amount of insulin. This brought her blood sugars under better control. We re figured hr insulin calc settings based on her insulin doses yesterday (57 units total)  and changed her setting to ICR of 1:8 and a correction factor of 32 instead of 45.

## 2019-06-07 ENCOUNTER — Other Ambulatory Visit: Payer: Self-pay

## 2019-06-07 ENCOUNTER — Ambulatory Visit (HOSPITAL_COMMUNITY)
Admission: RE | Admit: 2019-06-07 | Discharge: 2019-06-07 | Disposition: A | Discharge status: Home / Self Care | Payer: HMO | Source: Ambulatory Visit | Attending: Cardiology | Admitting: Cardiology

## 2019-06-07 ENCOUNTER — Encounter (HOSPITAL_COMMUNITY)
Admission: RE | Admit: 2019-06-07 | Discharge: 2019-06-07 | Disposition: A | Discharge status: Home / Self Care | Payer: HMO | Source: Ambulatory Visit | Attending: Cardiology | Admitting: Cardiology

## 2019-06-07 DIAGNOSIS — I5032 Chronic diastolic (congestive) heart failure: Secondary | ICD-10-CM | POA: Diagnosis not present

## 2019-06-07 DIAGNOSIS — I509 Heart failure, unspecified: Secondary | ICD-10-CM | POA: Diagnosis not present

## 2019-06-07 MED ORDER — TECHNETIUM TC 99M PYROPHOSPHATE
20.1000 | Freq: Once | INTRAVENOUS | Status: AC | PRN
  Administered 2019-06-07: 20.1 via INTRAVENOUS
  Filled 2019-06-07: qty 21

## 2019-06-08 ENCOUNTER — Telehealth (HOSPITAL_COMMUNITY): Payer: Self-pay

## 2019-06-08 DIAGNOSIS — E859 Amyloidosis, unspecified: Secondary | ICD-10-CM

## 2019-06-08 NOTE — Telephone Encounter (Signed)
-----  Message from [name], MD sent at 06/07/2019 11:37 PM EDT ----- Equivocal results for TTR amyloidosis.  Repeat study in 6 months.

## 2019-06-08 NOTE — Telephone Encounter (Signed)
Pt aware of results. Pt appreciative.  Order placed for repeat PYP in 6 months.

## 2019-06-13 ENCOUNTER — Telehealth: Payer: Self-pay | Admitting: Pharmacist

## 2019-06-13 NOTE — Telephone Encounter (Signed)
Received text from patient performing PST POC FS INR = 2.30 (goal 1.5 - 2.5). Will reduce from 13.45m warfarin/wk to 12.535mwarfarin/wk. Repeat INR 10-MAY-21. No bleeding per patient.

## 2019-06-15 ENCOUNTER — Ambulatory Visit: Payer: HMO | Admitting: Licensed Clinical Social Worker

## 2019-06-16 ENCOUNTER — Telehealth: Payer: Self-pay | Admitting: Dietician

## 2019-06-16 NOTE — Telephone Encounter (Signed)
Concerned that meter and CGM readings not close enough:   Sensor 171/meter 205  alarmed low 89/meter 153/    2am-129 at 2 am- meter 200/    722 am 136-  3u for coffee, meter 173 9 am hungry- 144- 8u for muffin that was 55 g carb, eggs, 242 high alarm  Muffin 55 grams carbs and eggs.meter 190  She called [employer] support- they told her to check it again in 2 hours,  I suggested she not compare her meter but to use her CGM readings (safer with lower values to calculate insulin)  except when CGM indicates she should use her meter.   she confirmed her appointment for tomorrow and agreed to upload prior to her appointment  Repeat A1c  due this month.  Lab Results  Component Value Date   HGBA1C 5.4 04/07/2019   HGBA1C 7.1 (A) 12/16/2018   HGBA1C 6.5 (A) 07/15/2018   HGBA1C 6.3 (A) 02/19/2018   HGBA1C 6.7 (A) 11/13/2017

## 2019-06-17 ENCOUNTER — Encounter: Payer: Self-pay | Admitting: Dietician

## 2019-06-17 ENCOUNTER — Telehealth: Payer: Self-pay

## 2019-06-17 ENCOUNTER — Other Ambulatory Visit: Payer: Self-pay | Admitting: *Deleted

## 2019-06-17 ENCOUNTER — Other Ambulatory Visit: Payer: Self-pay

## 2019-06-17 ENCOUNTER — Telehealth: Payer: Self-pay | Admitting: Internal Medicine

## 2019-06-17 ENCOUNTER — Ambulatory Visit (INDEPENDENT_AMBULATORY_CARE_PROVIDER_SITE_OTHER): Payer: HMO | Admitting: Dietician

## 2019-06-17 DIAGNOSIS — E1140 Type 2 diabetes mellitus with diabetic neuropathy, unspecified: Secondary | ICD-10-CM | POA: Diagnosis not present

## 2019-06-17 DIAGNOSIS — I480 Paroxysmal atrial fibrillation: Secondary | ICD-10-CM

## 2019-06-17 DIAGNOSIS — Z794 Long term (current) use of insulin: Secondary | ICD-10-CM | POA: Diagnosis not present

## 2019-06-17 DIAGNOSIS — Z713 Dietary counseling and surveillance: Secondary | ICD-10-CM | POA: Diagnosis not present

## 2019-06-17 DIAGNOSIS — F419 Anxiety disorder, unspecified: Secondary | ICD-10-CM

## 2019-06-17 MED ORDER — WARFARIN SODIUM 2.5 MG PO TABS: ORAL_TABLET | ORAL | 1 refills | Status: DC

## 2019-06-17 MED ORDER — ALPRAZOLAM 1 MG PO TABS: ORAL_TABLET | ORAL | 4 refills | Status: DC

## 2019-06-17 NOTE — Telephone Encounter (Signed)
PLS CONTACT [name] [phone] REGARDING PAPERWORK

## 2019-06-17 NOTE — Telephone Encounter (Signed)
TC received from patient c/o left hand pain, requesting and appointment.  States she has had this pain for "some time now and mentioned this at her last visit with her PCP".  Appt offered in [facility] next week, pt declined and states she wants to see Dr. Lynnae January. Appt made with PCP on 06/30/19 @ 1045 SChaplin, RN,BSN

## 2019-06-17 NOTE — Progress Notes (Addendum)
Medical Nutrition Therapy Start time:0916 End time: 1188 Total time: 19 minutes  Telephone visit due to COVID-19. I connected with  [name] on 06/17/19 by a video enabled telemedicine application and verified that I am speaking with the correct person using two identifiers.   I discussed the limitations of evaluation and management by telemedicine. The patient expressed understanding and agreed to proceed.  This is a follow up visit via telehealth for [name] for CGM upload review and advanced carbohydrate counting/djustments to her insulin calc settings if needed. Her blood sugars are much improved in the past week compared to the week prior with less hyperglycemia and no hypoglycemia. She is using the insulin calc most of the time except taking 3 units to cover a cup of coffee. She has some excursion post meal above 180 mg/dl.   CGM Results   Average is  172  for 14 days;  % time CGM is active 92% Glucose management indicator 7.4 % Time in range (70-180 mg/dL): 63 % (Goal >70%) Time High (181-250 mg/dL) 34 % (Goal < 25%) Time Very High (>250 mg/dl) 3 % (Goal < 5%) Time Low (54-69 mg/dL): 0 % (Goal is <4%) Time Very Low (<54) 0%  (Goal <1%)  Coefficient of variation:23.1% (Goal is <36%)  Changes agreed upon today are : Increase insulin for food:decreased ICR to 1:7. Follow up in 3 weeks, [name] agrees to contact [facility] if blood sugars 70-90 frequently  or 1 under [place], RD 06/17/2019 11:38 AM.

## 2019-06-20 NOTE — Telephone Encounter (Signed)
Returned call to [name]. Needs recent chart notes with O2 sats for re-certification. Patient has upcoming appt with PCP on 06/30/2019 and this can be addressed at that time. Hubbard Hartshorn, BSN, RN-BC

## 2019-06-22 ENCOUNTER — Telehealth: Payer: Self-pay | Admitting: Dietician

## 2019-06-22 ENCOUNTER — Telehealth (HOSPITAL_COMMUNITY): Payer: Self-pay | Admitting: Cardiology

## 2019-06-22 NOTE — Telephone Encounter (Signed)
Left voicemail for return call  

## 2019-06-22 NOTE — Telephone Encounter (Signed)
Called and left message for pt to call back.  Need to r/s appt on 09/22/19 APP schedule.

## 2019-06-27 ENCOUNTER — Telehealth: Payer: Self-pay | Admitting: Internal Medicine

## 2019-06-27 ENCOUNTER — Telehealth: Payer: Self-pay | Admitting: *Deleted

## 2019-06-27 ENCOUNTER — Telehealth: Payer: Self-pay | Admitting: Pharmacist

## 2019-06-27 DIAGNOSIS — I480 Paroxysmal atrial fibrillation: Secondary | ICD-10-CM | POA: Diagnosis not present

## 2019-06-27 DIAGNOSIS — Z7901 Long term (current) use of anticoagulants: Secondary | ICD-10-CM | POA: Diagnosis not present

## 2019-06-27 NOTE — Telephone Encounter (Signed)
Reason for call:   I received a call from Ms. Grissel Arminta [name] at 6:15 AM indicating that she was diabetic and has been using the [employer] 2.    Pertinent Data:   She stated that the alarm feature sounded at 1 am and notified her that her blood glucose was 85m/dL for which she ate a small snack and took novolog 5 units.   Alarm sounded again at 2am 935mdL  And again at 4am 8051mL. This is when she drank some grape juice and frozen fruit but did not take any novolog.  At 4:54am her CGM device indicated that her blood glucose was 54.  At 6:08 am  fingerstick indicated that her glucose was 241. 2mi6mater she again checked her CBG which indicated 188mg55m Her CGM device stated that this was 127mg/29mt that time.  She did not note symptoms with any of the above readings.  She has been having difficulty with her CGM device. It appears through chart review that she has been adjusting her carb ratio and correction factor with the most recent being 1:7 and 32 respectively.    Assessment / Plan / Recommendations:   I advised the patient that her data did make sense to a degree and that perhaps she did not need 5 units of insulin for the snack which she calculated at 1am this morning. This in turn caused her to drop again for which she again overcompensated with an additional meal causing her blood glucose to rise. I feel that she need continued discussion and training on the CGM device and perhaps an adjustment to her alarm features.   She will contact the clinic in when they open this morning and attempt to speak with the [facility] her concerns. There was no discernable immediate medical issue of note discussed.  As always, pt is advised that if symptoms worsen or new symptoms arise, they should go to an urgent care facility or to to ER for further evaluation.   [name] 06/27/2019, 6:24 AM

## 2019-06-27 NOTE — Telephone Encounter (Signed)
Left voicemail for patient to change Insulin to carb ratio back to 1:8 in her insulin calc settings. Plan to call her tomorrow to follow up. She can upload her CGM as well. Have suggested inpen in the past to prevent insulin stacking and enable different setting for different times of day.

## 2019-06-27 NOTE — Telephone Encounter (Signed)
Sounds good, thank you

## 2019-06-27 NOTE — Telephone Encounter (Signed)
Patient had texted me results of the PST FS POC INR they performed this morning. Patient has been contacted with instructions provided. Thank you.

## 2019-06-27 NOTE — Telephone Encounter (Signed)
Patient texted me results of her PST FS POC INR (target 1.5 - 2.5) of 1.8 (in-range) on 12.38m TOTAL PER WEEK as: 2.55mon Su/We/Th/Sa; 31m71mn Mo; and 1.5m8m Tu/Fr. Continue same regimen. No bleeding she states. Repeat INR 24-MAY-21 as PST FS POC INR.

## 2019-06-27 NOTE — Telephone Encounter (Signed)
INR 06/27/2019 1.8

## 2019-06-28 NOTE — Telephone Encounter (Signed)
Left voicemail for return call

## 2019-06-29 NOTE — Telephone Encounter (Signed)
Spoke with Cathie Beams today, she uploaded her Freestyle Continuous glucose monitor.  She called [employer]  Due to continued discrepancy between her meter and the CGM (554 pm- sensor 54, meter 241), they are sending her a new reader  CGM alerted for a low blood sugar yesterday while asleep, snoozed right through it, went off again  upload shows no lows. CGM showed 2 low blood sugar events. She denies symptoms.

## 2019-06-30 ENCOUNTER — Encounter: Payer: Self-pay | Admitting: Dietician

## 2019-06-30 ENCOUNTER — Ambulatory Visit: Payer: HMO | Admitting: Dietician

## 2019-06-30 ENCOUNTER — Ambulatory Visit (INDEPENDENT_AMBULATORY_CARE_PROVIDER_SITE_OTHER): Payer: HMO | Admitting: Internal Medicine

## 2019-06-30 ENCOUNTER — Other Ambulatory Visit: Payer: Self-pay

## 2019-06-30 ENCOUNTER — Ambulatory Visit: Payer: HMO

## 2019-06-30 ENCOUNTER — Ambulatory Visit (HOSPITAL_COMMUNITY)
Admission: RE | Admit: 2019-06-30 | Discharge: 2019-06-30 | Disposition: A | Discharge status: Home / Self Care | Payer: HMO | Source: Ambulatory Visit | Attending: Internal Medicine | Admitting: Internal Medicine

## 2019-06-30 VITALS — BP 118/45 | HR 65 | Temp 98.6°F | Wt 174.3 lb

## 2019-06-30 DIAGNOSIS — J449 Chronic obstructive pulmonary disease, unspecified: Secondary | ICD-10-CM

## 2019-06-30 DIAGNOSIS — Z9981 Dependence on supplemental oxygen: Secondary | ICD-10-CM

## 2019-06-30 DIAGNOSIS — M79642 Pain in left hand: Secondary | ICD-10-CM | POA: Diagnosis not present

## 2019-06-30 DIAGNOSIS — M79641 Pain in right hand: Secondary | ICD-10-CM | POA: Diagnosis not present

## 2019-06-30 DIAGNOSIS — M19041 Primary osteoarthritis, right hand: Secondary | ICD-10-CM | POA: Diagnosis not present

## 2019-06-30 DIAGNOSIS — I2723 Pulmonary hypertension due to lung diseases and hypoxia: Secondary | ICD-10-CM | POA: Diagnosis not present

## 2019-06-30 DIAGNOSIS — M19042 Primary osteoarthritis, left hand: Secondary | ICD-10-CM | POA: Diagnosis not present

## 2019-06-30 DIAGNOSIS — I2722 Pulmonary hypertension due to left heart disease: Secondary | ICD-10-CM

## 2019-06-30 NOTE — Assessment & Plan Note (Signed)
This problem is new.  She complains of bilateral hand pain, left greater than right.  It starts in the base of the left thumb and goes up her arm.  It can get severe.  She notices no morning stiffness.  She notices no numbness or tingling of the digits.  There are no triggers.  She is tender over the left first [place] joint.  I suspect this is osteoarthritis.  She has no soft tissue swelling nor symptoms of inflammation or a rheumatologic disorder.  We will start with plain films to rule out other etiologies.  I recommended Voltaren gel to treat the chronic pain.  Plan:  bilateral plain films hands Voltaren gel

## 2019-06-30 NOTE — Progress Notes (Signed)
   Subjective:    Patient ID: [name], female    DOB: [birth year], 69 y.o.   MRN: [id number]  HPI  [name] is here for hand pain. Please see the A&P for the status of the pt's chronic medical problems.  ROS : per ROS section and in problem oriented charting. All other systems are negative.  PMHx, Soc hx, and / or Fam hx : [name] is now just over 1 yr, walking and talking.   Review of Systems  Musculoskeletal: Positive for arthralgias.       Objective:   Physical Exam Musculoskeletal:     Comments: No soft tissue swelling, No joint deformities, sensation nl, FROM, + tenderness L 1st CMC joint, grip strength nl        Assessment & Plan:

## 2019-06-30 NOTE — Patient Instructions (Addendum)
1. I am getting Xray of your hands 2. Try Voltaren (sent to pharmacy) on hands for pain. I need to check cost using insurance. Might be cheaper over the counter 3. Se Dr [name] in next 3-4 months 4. Continue working with Butch Penny

## 2019-06-30 NOTE — Progress Notes (Signed)
Debera Lat and I saw Ms. Bullinger today to discuss a couple questions she had about her CGM device. She had a questions about hypoglycemia alerts on her FreeStyle Libre that we emailed the rep about and then relayed the information onto Ms. Fordham via a [employer].   Marin Roberts, Student-Dietician 06/30/2019 1:30 PM

## 2019-06-30 NOTE — Assessment & Plan Note (Signed)
This problem is chronic and uncontrolled.  She uses oxygen by nasal cannula continuously.  Her O2 sat on room air at rest was 96%.  She desaturated to 87% with ambulating.  Her O2 sat on 2 L of oxygen while ambulating remained 96%.  She requires oxygen by nasal cannula at 2 L to treat her pulmonary hypertension which is secondary to chronic diastolic heart failure and COPD.  Oxygen treatment will prevent worsening of her pulmonary hypertension  PLAN : Oxygen by nasal cannula at 2 L

## 2019-06-30 NOTE — Progress Notes (Signed)
SATURATION QUALIFICATIONS: (This note is used to comply with regulatory documentation for home oxygen)  Patient Saturations on Room Air at Rest = 96%  Patient Saturations on Room Air while Ambulating = 87%  Patient Saturations on 2 Liters of oxygen while Ambulating = 96%  Please briefly explain why patient needs home oxygen: see office visit notes

## 2019-06-30 NOTE — Telephone Encounter (Signed)
Today's OV note with qualifying O2 Sats faxed to [name] D at [facility]. Fax confirmation receipt received. Hubbard Hartshorn, BSN, RN-BC

## 2019-06-30 NOTE — Assessment & Plan Note (Signed)
This problem is chronic and uncontrolled.  I reviewed Dr. Claris Gladden most recent office note and due to her diastolic heart failure and neuropathy, he was concerned for amyloidosis.She had a cardiac amyloid scan in April which showed concern for amyloidosis and a repeat scan was recommended which he plans to obtain in 6 months.  PLAN:  Cont current meds, including Lasix 80 mg in the morning and 40 at night.

## 2019-07-01 DIAGNOSIS — M19042 Primary osteoarthritis, left hand: Secondary | ICD-10-CM | POA: Insufficient documentation

## 2019-07-01 DIAGNOSIS — M19041 Primary osteoarthritis, right hand: Secondary | ICD-10-CM | POA: Insufficient documentation

## 2019-07-04 ENCOUNTER — Telehealth: Payer: Self-pay | Admitting: Pharmacist

## 2019-07-04 NOTE — Telephone Encounter (Signed)
Patient calls me results of her PST FS POC INR 2.3 (target goal 1.5 - 2.5) on 12.57m warfarin/wk. as: 2.574mon Su/We/Th/Sa; 1.25 (1/2 x 2.21m74mab) on Tu/Fri; 0mg83m Mondays. Continue same regimen. Repeat PST INR 1-JUN-21. Denies any symptoms/signs of bleeding or embolic event(s).

## 2019-07-06 ENCOUNTER — Other Ambulatory Visit: Payer: Self-pay

## 2019-07-06 ENCOUNTER — Telehealth: Payer: Self-pay

## 2019-07-06 ENCOUNTER — Ambulatory Visit (INDEPENDENT_AMBULATORY_CARE_PROVIDER_SITE_OTHER): Payer: HMO | Admitting: Licensed Clinical Social Worker

## 2019-07-06 ENCOUNTER — Encounter: Payer: Self-pay | Admitting: Licensed Clinical Social Worker

## 2019-07-06 DIAGNOSIS — F321 Major depressive disorder, single episode, moderate: Secondary | ICD-10-CM

## 2019-07-06 NOTE — BH Specialist Note (Signed)
Integrated Behavioral Health Visit via Telemedicine (Telephone)  07/06/2019 [name] [id number]   Session Start time: 11:00  Session End time: 11:30 Total time: 30 minutes  Referring Provider: Dr. Lynnae January Type of Visit: Telephonic Patient location: Home [facility] Provider location: Office All persons participating in visit: Patient and [facility]  Confirmed patient's address: Yes  Confirmed patient's phone number: Yes  Any changes to demographics: No  Discussed confidentiality: Yes    The following statements were read to the patient and/or legal guardian that are established with the [facility] Provider.  "The purpose of this phone visit is to provide behavioral health care while limiting exposure to the coronavirus (COVID19).  There is a possibility of technology failure and discussed alternative modes of communication if that failure occurs."  "By engaging in this telephone visit, you consent to the provision of healthcare.  Additionally, you authorize for your insurance to be billed for the services provided during this telephone visit."   Patient and/or legal guardian consented to telephone visit: Yes   PRESENTING CONCERNS: Patient and/or family reports the following symptoms/concerns: memory issues, anxiety, moderate levels of depression, chronic health,sleep disturbances,and lack of natural resources. Duration of problem: increased over the past 8 years; Severity of problem: moderate   GOALS ADDRESSED: Patient will: 1.  Reduce symptoms of: anxiety and depression  2.  Increase knowledge and/or ability of: coping skills and healthy habits  3.  Demonstrate ability to: Increase healthy adjustment to current life circumstances and Increase adequate support systems for patient/family  INTERVENTIONS: Interventions utilized:  Brief CBT, Supportive Counseling, Medication Monitoring and Sleep Hygiene Standardized Assessments completed: Not  Needed  ASSESSMENT: Patient currently experiencing moderate levels of depression. Patient is reporting "extreme exaughstion". Patient has a sleep study scheduled. Patient reporting she sleeps multiple times throughout the day. The naps are due to feeling tired, loneliness, and boredom. Patient is concerned about health issues, and she is hopeful that she can have them addressed. Patient does have interactions over the phone with family members that does bring brief periods of happiness to her.   Patient may benefit from counseling.  PLAN: 1. Follow up with behavioral health clinician on : three weeks.  Dessie Coma, [facility], [place]

## 2019-07-06 NOTE — Telephone Encounter (Signed)
Spoke w/ pt, gave her hand xrays results, she was pleased

## 2019-07-06 NOTE — Telephone Encounter (Signed)
Requesting to speak with a nurse. Per patient do not call pt at 11-11:30.

## 2019-07-08 ENCOUNTER — Encounter: Payer: Self-pay | Admitting: Dietician

## 2019-07-08 ENCOUNTER — Ambulatory Visit (INDEPENDENT_AMBULATORY_CARE_PROVIDER_SITE_OTHER): Payer: HMO | Admitting: Dietician

## 2019-07-08 DIAGNOSIS — E1140 Type 2 diabetes mellitus with diabetic neuropathy, unspecified: Secondary | ICD-10-CM

## 2019-07-08 DIAGNOSIS — Z794 Long term (current) use of insulin: Secondary | ICD-10-CM | POA: Diagnosis not present

## 2019-07-08 NOTE — Progress Notes (Signed)
Start time: 1128 am  End time: 1200 pm,  total time: 32 minutes  Telephone visit due to COVID-19.  I connected with  [name] on 07/08/19 by a telephone and verified that I am speaking with the correct person using two identifiers.   I discussed the limitations of evaluation and management by telemedicine. The patient expressed understanding and agreed to proceed.  Call to follow up on CGM alarms and insulincal settings to minimize hypoglycemia, prevent hyperglycemia: She uploaded her CGM:  CGM Results from home-  CGM time active  96  % Average is  171  for 14 days Glucose management indicator 7.4 % Time in range (70-180 mg/dL): 61 % (Goal >70%) Time High (181-250 mg/dL) 35 % (Goal < 25%) Time Very High (>250 mg/dl) 4 % (Goal < 5%) Time Low (54-69 mg/dL): 0 % (Goal is <4%) Time Very Low (<54) 0%  (Goal <1%)  Coefficient of variation:24.5% (Goal is <36%)  After looking at her previous 24 hours of insulin with her, it appears she may have stacked her insulin yesterday morning which resulted in her blood sugar dipping from 180 to ~ 80. I again recommended she consider using an Inpen for her rapid acting insulin administration to keep track of her insulin on board and times she gives her injections to prevent stacking.Because she achieved 14 days without hypoglycemia, we agreed to continue the same setting in her insulin cal app concentrating of trying to be sure she is accurately carb counting and rounds up for higher fat foods to lower some of her peaks > 200.  Plan is to follow up in 2 weeks reveiwing her upload from home.  Debera Lat, RD 07/08/2019 2:38 PM.

## 2019-07-12 ENCOUNTER — Telehealth: Payer: Self-pay | Admitting: Pharmacist

## 2019-07-12 NOTE — Telephone Encounter (Signed)
Patient texted me results of PST FS POC INR 1.9 (goal 1.5 - 2.5) on [place]. Was advised at that time to continue same regimen and perform repeat PST FS POC INR on 7-JUN-21. No bleeding. No embolic events. Results and instructions entered into both [email or web address] with instructions now provided to the patient as well as entered here in [facility].

## 2019-07-13 ENCOUNTER — Other Ambulatory Visit (HOSPITAL_COMMUNITY): Payer: Self-pay | Admitting: Internal Medicine

## 2019-07-13 ENCOUNTER — Other Ambulatory Visit (HOSPITAL_COMMUNITY): Payer: Self-pay | Admitting: Adult Health

## 2019-07-13 ENCOUNTER — Other Ambulatory Visit: Payer: Self-pay | Admitting: Internal Medicine

## 2019-07-13 ENCOUNTER — Other Ambulatory Visit: Payer: Self-pay | Admitting: *Deleted

## 2019-07-13 DIAGNOSIS — K219 Gastro-esophageal reflux disease without esophagitis: Secondary | ICD-10-CM

## 2019-07-13 DIAGNOSIS — Z794 Long term (current) use of insulin: Secondary | ICD-10-CM

## 2019-07-13 DIAGNOSIS — I5032 Chronic diastolic (congestive) heart failure: Secondary | ICD-10-CM

## 2019-07-13 DIAGNOSIS — E1140 Type 2 diabetes mellitus with diabetic neuropathy, unspecified: Secondary | ICD-10-CM

## 2019-07-13 DIAGNOSIS — E785 Hyperlipidemia, unspecified: Secondary | ICD-10-CM

## 2019-07-13 DIAGNOSIS — F322 Major depressive disorder, single episode, severe without psychotic features: Secondary | ICD-10-CM

## 2019-07-13 DIAGNOSIS — K449 Diaphragmatic hernia without obstruction or gangrene: Secondary | ICD-10-CM

## 2019-07-13 MED ORDER — GABAPENTIN 300 MG PO CAPS: 600.0000 mg | ORAL_CAPSULE | Freq: Three times a day (TID) | ORAL | 3 refills | Status: DC

## 2019-07-13 MED ORDER — BUPROPION HCL 75 MG PO TABS: 75.0000 mg | ORAL_TABLET | Freq: Two times a day (BID) | ORAL | 3 refills | Status: DC

## 2019-07-13 NOTE — Telephone Encounter (Signed)
6 month supply in April

## 2019-07-13 NOTE — Telephone Encounter (Signed)
Does she want her wellbutrin in QD formulation? 150 QD??

## 2019-07-18 ENCOUNTER — Telehealth: Payer: Self-pay | Admitting: Pharmacist

## 2019-07-18 NOTE — Telephone Encounter (Signed)
Patient texted me results of PST FS POC INR 2.0 (goal 1.5 - 2.5) while taking:  2.39m warfarin on We/Th/Sa/Su; 057mon Mondays; 1.2557m1/2 x 2.5mg55mblet) on Tu/Fri. Advised to CONTINUE this same regimen. Denies any evidence of bleeding or embolic dx.  REPEAT PST FS, POC INR on 14-JUN-21.

## 2019-07-20 ENCOUNTER — Other Ambulatory Visit (HOSPITAL_COMMUNITY)
Admission: RE | Admit: 2019-07-20 | Discharge: 2019-07-20 | Disposition: A | Discharge status: Home / Self Care | Payer: HMO | Source: Ambulatory Visit | Attending: Internal Medicine | Admitting: Internal Medicine

## 2019-07-20 DIAGNOSIS — Z01812 Encounter for preprocedural laboratory examination: Secondary | ICD-10-CM | POA: Insufficient documentation

## 2019-07-20 DIAGNOSIS — Z20822 Contact with and (suspected) exposure to covid-19: Secondary | ICD-10-CM | POA: Insufficient documentation

## 2019-07-20 LAB — SARS CORONAVIRUS 2 (TAT 6-24 HRS): SARS Coronavirus 2: NEGATIVE

## 2019-07-21 ENCOUNTER — Telehealth: Payer: Self-pay | Admitting: Internal Medicine

## 2019-07-21 NOTE — Telephone Encounter (Signed)
Pls contact pt [phone] 

## 2019-07-21 NOTE — Telephone Encounter (Signed)
RTC, she ask who would get her resuts from sleep study with dr Software engineer leaving, states "im going to fall thru the cracks, I just know I am" assured her that the attending would be given the results and would f/u. Reassured her several times

## 2019-07-22 ENCOUNTER — Ambulatory Visit: Payer: HMO | Admitting: Dietician

## 2019-07-22 ENCOUNTER — Encounter: Payer: Self-pay | Admitting: Dietician

## 2019-07-22 ENCOUNTER — Ambulatory Visit: Payer: Self-pay

## 2019-07-22 ENCOUNTER — Other Ambulatory Visit: Payer: Self-pay

## 2019-07-22 ENCOUNTER — Ambulatory Visit (HOSPITAL_BASED_OUTPATIENT_CLINIC_OR_DEPARTMENT_OTHER): Payer: HMO | Attending: Internal Medicine | Admitting: Internal Medicine

## 2019-07-22 VITALS — Ht 62.0 in | Wt 170.0 lb

## 2019-07-22 DIAGNOSIS — G4733 Obstructive sleep apnea (adult) (pediatric): Secondary | ICD-10-CM | POA: Diagnosis not present

## 2019-07-22 NOTE — Progress Notes (Signed)
Diabetes Self Management education and support:   Start time: 0940 am  End time: 1000am  total time: 20 minutes  Telephone visit due to COVID-19.  I connected with  [name] on 07/22/19 by a telephone and verified that I am speaking with the correct person using two identifiers.   I discussed the limitations of evaluation and management by telemedicine. The patient expressed understanding and agreed to proceed.  Current amount of Novolog 36.5 with 3-4 injections per day, metformin  Victoza- on max amount- 1.8 mg excellent adherence. could try increased amount vs switch to Ozempic. Willing to switch to Ozempic to see if it will lower her basal.   2 readers uploaded CGM Results from download:   Average glucose:   175 mg/dL for 14 days  Glucose management indicator:   7.5 %  Time in range (70-180 mg/dL):   59 %   (Goal >70%)  Time High (181-250 mg/dL):   37 %   (Goal < 25%)  Time Very High (>250 mg/dL):    4 %     (Goal < 5%)  Time Low (54-69 mg/dL):   0 %     (Goal <4%)  Time Very Low (<54 mg/dL):   0 %    (Goal <1%)  Coefficient of variation:   22.5 %   (Goal <36%)  Patterns: mostly flat pattern between 150-148m/dl with slight dip between 3am and 12 noon   Low blood sugar symptoms- reports none in past 2 weeks; but does not always get symptoms  Estimated weight -170# Food intake:  Lean cuisine, greek yogurt, mobile meals, fresh fruit, bananas, strawberries, blueberries,1/2 cafeinatedf coffee coffee mate and Splenda, caffeine free diet pepsi, ice, deli meat- ham and chicken food sandwiches 23 carbs for 2 slices  [name] she is trying to eat lower carb and make healthful choices. We discussed a possile change to her GLP-1 and sh is will to switch to Ozempic to see if it will help lower her blood sugarsans weight.  Plan to discuss with her doctor.   DDebera Lat RD 07/22/2019 1:11 PM.

## 2019-07-25 ENCOUNTER — Telehealth: Payer: Self-pay

## 2019-07-25 DIAGNOSIS — I480 Paroxysmal atrial fibrillation: Secondary | ICD-10-CM | POA: Diagnosis not present

## 2019-07-25 DIAGNOSIS — Z7901 Long term (current) use of anticoagulants: Secondary | ICD-10-CM | POA: Diagnosis not present

## 2019-07-25 NOTE — Telephone Encounter (Signed)
Requesting to speak with the nurse,please call back.

## 2019-07-25 NOTE — Telephone Encounter (Signed)
Have rtc, she is very stressed about her sleep study, states the person doing it made comments She is also concerned about an area on her abdomen that dr Eppie Gibson had mentioned may be a hernia, it seems to be getting worse and causing more discomfort BUT pt refuses appt at present states she will wait until she sees her new pcp, dr [name].

## 2019-07-27 ENCOUNTER — Other Ambulatory Visit: Payer: Self-pay | Admitting: *Deleted

## 2019-07-27 ENCOUNTER — Other Ambulatory Visit: Payer: Self-pay | Admitting: Internal Medicine

## 2019-07-27 ENCOUNTER — Telehealth: Payer: Self-pay | Admitting: Pharmacist

## 2019-07-27 DIAGNOSIS — I480 Paroxysmal atrial fibrillation: Secondary | ICD-10-CM

## 2019-07-27 DIAGNOSIS — J439 Emphysema, unspecified: Secondary | ICD-10-CM

## 2019-07-27 MED ORDER — SPIRIVA RESPIMAT 2.5 MCG/ACT IN AERS: 2.0000 | INHALATION_SPRAY | Freq: Every day | RESPIRATORY_TRACT | 1 refills | Status: DC

## 2019-07-27 NOTE — Telephone Encounter (Signed)
Patient reported PST POC FS INR value 2.1 (target 1.5 - 2.5) taking 61m on Monday/1/2 x 2.557m[1.2525mose] on Tu/Fr; 2.5mg81m We/Th/Sa/Su. Continue same regimen. Retest 21-JUN-21. Reports no bleeding, no new medications.

## 2019-07-28 ENCOUNTER — Ambulatory Visit: Payer: HMO | Admitting: Internal Medicine

## 2019-07-28 ENCOUNTER — Other Ambulatory Visit: Payer: Self-pay | Admitting: Student in an Organized Health Care Education/Training Program

## 2019-07-28 DIAGNOSIS — J439 Emphysema, unspecified: Secondary | ICD-10-CM

## 2019-07-30 DIAGNOSIS — G4733 Obstructive sleep apnea (adult) (pediatric): Secondary | ICD-10-CM

## 2019-07-30 NOTE — Procedures (Signed)
    Patient Name: [name], [name] Date: 07/22/2019 Gender: Female D.O.B: [birth year] Age (years): 31 Referring Provider: Bartholomew Crews Height (inches): 61 Interpreting Physician: Baird Lyons MD, ABSM Weight (lbs): 170 RPSGT: Earney Hamburg BMI: 31 MRN: [id number] Neck Size: 17.50  CLINICAL INFORMATION Sleep Study Type: NPSG Indication for sleep study: OSA Epworth Sleepiness Score: 12  SLEEP STUDY TECHNIQUE As per the AASM Manual for the Scoring of Sleep and Associated Events v2.3 (April 2016) with a hypopnea requiring 4% desaturations.  The channels recorded and monitored were frontal, central and occipital EEG, electrooculogram (EOG), submentalis EMG (chin), nasal and oral airflow, thoracic and abdominal wall motion, anterior tibialis EMG, snore microphone, electrocardiogram, and pulse oximetry.  MEDICATIONS Medications self-administered by patient taken the night of the study :  Xanax, hydrocodone, plus multiple other unlisted meds.  SLEEP ARCHITECTURE The study was initiated at 10:35:09 PM and ended at 4:56:12 AM.  Sleep onset time was 12.8 minutes and the sleep efficiency was 70.1%%. The total sleep time was 267.2 minutes.  Stage REM latency was 116.5 minutes.  The patient spent 0.7%% of the night in stage N1 sleep, 83.0%% in stage N2 sleep, 0.0%% in stage N3 and 16.3% in REM.  Alpha intrusion was absent.  Supine sleep was 31.81%.  RESPIRATORY PARAMETERS The overall apnea/hypopnea index (AHI) was 0.0 per hour. There were 0 total apneas, including 0 obstructive, 0 central and 0 mixed apneas. There were 0 hypopneas and 11 RERAs.  The AHI during Stage REM sleep was 0.0 per hour.  AHI while supine was 0.0 per hour.  The mean oxygen saturation was 94.0% on O2 2L.. The minimum SpO2 during sleep was 83.0% on room air.  soft snoring was noted during this study.  CARDIAC DATA The 2 lead EKG demonstrated sinus rhythm. The mean heart rate was 70.4 beats  per minute. Other EKG findings include: None.  LEG MOVEMENT DATA The total PLMS were 0 with a resulting PLMS index of 0.0. Associated arousal with leg movement index was 0.4 .  IMPRESSIONS - No significant obstructive sleep apnea occurred during this study (AHI = 0.0/h). - No significant central sleep apnea occurred during this study (CAI = 0.0/h). - Oxygen desaturation was noted initially on room air (Min O2 = 83.0%). Supplemental O2 (2L/M) was added at 12:03 AM, with subsequent mean O2 sat 93%. - The patient snored with soft snoring volume. - Occasional PVCs were noted during this study. - Clinically significant periodic limb movements did not occur during sleep. No significant associated arousals.  DIAGNOSIS - Nocturnal Hypoxemia (327.26 [G47.36 ICD-10])  RECOMMENDATIONS - Supplemental O2 during sleep. - Be careful with alcohol, sedatives and other CNS depressants that may worsen sleep apnea and disrupt normal sleep architecture. - Sleep hygiene should be reviewed to assess factors that may improve sleep quality. - Weight management and regular exercise should be initiated or continued if appropriate.  [Electronically signed] 07/30/2019 11:48 AM  Baird Lyons MD, ABSM Diplomate, American Board of Sleep Medicine   NPI: [id number]                         [place], [facility]  ELECTRONICALLY SIGNED ON:  07/30/2019, 11:40 AM [place] PH: [phone]   FX: [phone] [place]

## 2019-08-01 ENCOUNTER — Encounter: Payer: Self-pay | Admitting: *Deleted

## 2019-08-01 ENCOUNTER — Telehealth: Payer: Self-pay | Admitting: Pharmacist

## 2019-08-01 NOTE — Telephone Encounter (Signed)
Patient texted me results of PST FS POC INR testing performed today at home:  INR 1.7 (target 1.5 - 2.5) on 66m Mondays; 2.559mon We/Th/Sa/Su; 1.2574m1/2 x 2.5mg81mn Tu/Fri; Repeat PST FS POC INR 28-JUN-21. No bleeding reported.

## 2019-08-02 ENCOUNTER — Ambulatory Visit (INDEPENDENT_AMBULATORY_CARE_PROVIDER_SITE_OTHER): Payer: HMO | Admitting: Licensed Clinical Social Worker

## 2019-08-02 ENCOUNTER — Other Ambulatory Visit: Payer: Self-pay

## 2019-08-02 ENCOUNTER — Encounter: Payer: Self-pay | Admitting: Licensed Clinical Social Worker

## 2019-08-02 DIAGNOSIS — F321 Major depressive disorder, single episode, moderate: Secondary | ICD-10-CM

## 2019-08-02 NOTE — BH Specialist Note (Signed)
Integrated Behavioral Health Visit via Telemedicine (Telephone)  08/02/2019 [name] [id number]   Session Start time: 11:00  Session End time: 11:30 Total time: 30 minutes  Referring Provider: Dr. Lynnae January Type of Visit: Telephonic Patient location: Home [facility] Provider location: Office All persons participating in visit: Patient and [facility]  Confirmed patient's address: Yes  Confirmed patient's phone number: Yes  Any changes to demographics: No   Discussed confidentiality: Yes    The following statements were read to the patient and/or legal guardian that are established with the [facility] Provider.  "The purpose of this phone visit is to provide behavioral health care while limiting exposure to the coronavirus (COVID19).  There is a possibility of technology failure and discussed alternative modes of communication if that failure occurs."  "By engaging in this telephone visit, you consent to the provision of healthcare.  Additionally, you authorize for your insurance to be billed for the services provided during this telephone visit."   Patient and/or legal guardian consented to telephone visit: Yes   PRESENTING CONCERNS: Patient and/or family reports the following symptoms/concerns: memory issues, anxiety, moderate levels of depression, chronic health,sleep disturbances,and lack of natural resources. Duration of problem: increased over the past 8 years; Severity of problem: moderate  GOALS ADDRESSED: Patient will: 1.  Reduce symptoms of: anxiety and depression  2.  Increase knowledge and/or ability of: coping skills, healthy habits and stress reduction  3.  Demonstrate ability to: Increase healthy adjustment to current life circumstances, Increase adequate support systems for patient/family and Increase motivation to adhere to plan of care  INTERVENTIONS: Interventions utilized:  Motivational Interviewing, Brief CBT and Supportive Counseling Standardized  Assessments completed: assessed for SI, HI, and self-harm.  ASSESSMENT: Patient currently experiencing moderate levels of depression and anxiety Patient recently reported she had a sleep study, and the doctor reported she "did not have sleep apnea". However, patient reported that she will still use her equipment at night time. Patient reported having minimal support, and sometimes spends days at a time with no contact from others. Patient identified loneliness as a source of her depression. Patient relies on television and sleep as coping skills.  Patient may benefit from counseling  PLAN: 1. Follow up with behavioral health clinician on : one week.   Dessie Coma, [facility], [place]

## 2019-08-03 ENCOUNTER — Other Ambulatory Visit: Payer: Self-pay

## 2019-08-03 NOTE — Patient Outreach (Signed)
  [facility]) Care Management Chronic Special Needs Program    08/03/2019  Name: [name], DOB: 11-26-1950  MRN: [id number]   [name] is enrolled in a chronic special needs plan for Heart Failure. Telephone call to client for CSNP assessment follow up. Unable to reach client. HIPAA compliant voice message left with call back phone number and return call request.   PLAN; RNCM will attempt 2nd telephone call to client in 1 week.   Quinn Plowman RN,BSN,CCM [facility] Care Management [phone]

## 2019-08-04 ENCOUNTER — Other Ambulatory Visit: Payer: Self-pay

## 2019-08-04 IMAGING — RF DG ESOPHAGUS
7 of 8 series · 12 of 14 positions shown · non-contrast
Comparison: None.

CLINICAL DATA: Dysphagia.

EXAM:
ESOPHOGRAM / BARIUM SWALLOW / BARIUM TABLET STUDY
TECHNIQUE: Combined double contrast and single contrast examination performed
using effervescent crystals, thick barium liquid, and thin barium
liquid. The patient was observed with fluoroscopy swallowing a 13 mm
barium sulphate tablet.
FLUOROSCOPY TIME:  Fluoroscopy Time:  1 minute 48 seconds.
Radiation Exposure Index (if provided by the fluoroscopic device):
Number of Acquired Spot Images: 0

[Series 1: cp_standard · 0.34mm/px · 3 of 55 frames shown (1 of 7)]
[frame 3/55]
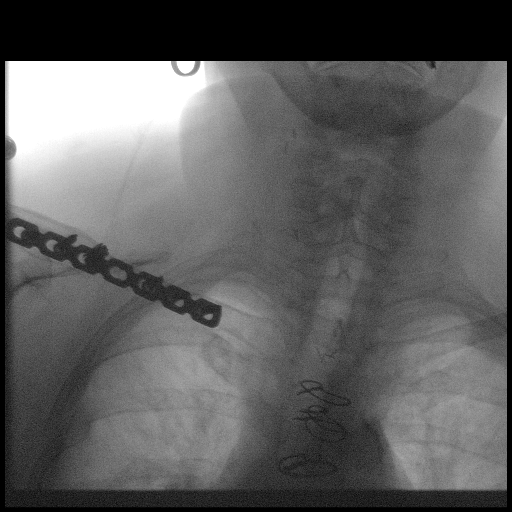
[frame 9/55]
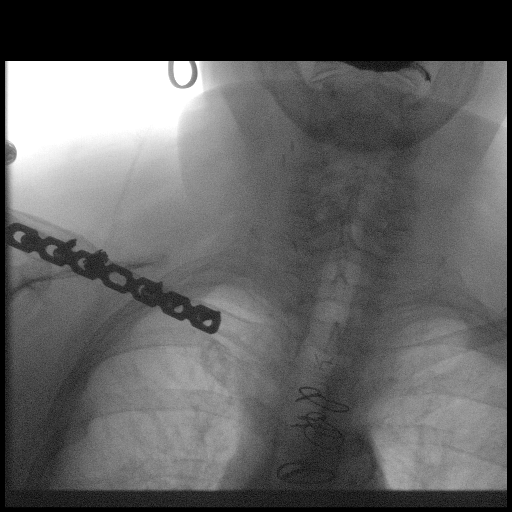
[frame 28/55]
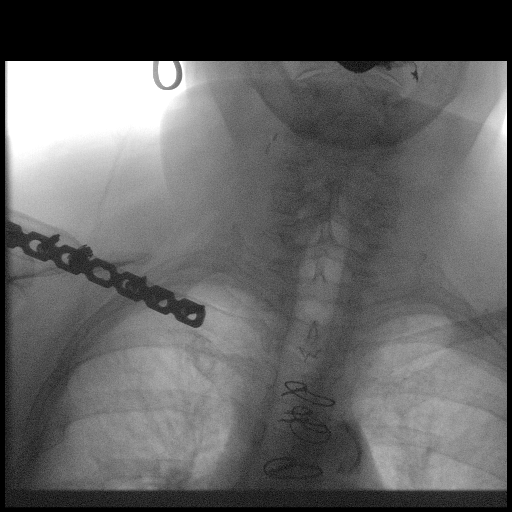

[Series 2: cp_standard · 0.17mm/px · 1 of 1 slices shown (2 of 7)]
[im 1/1]
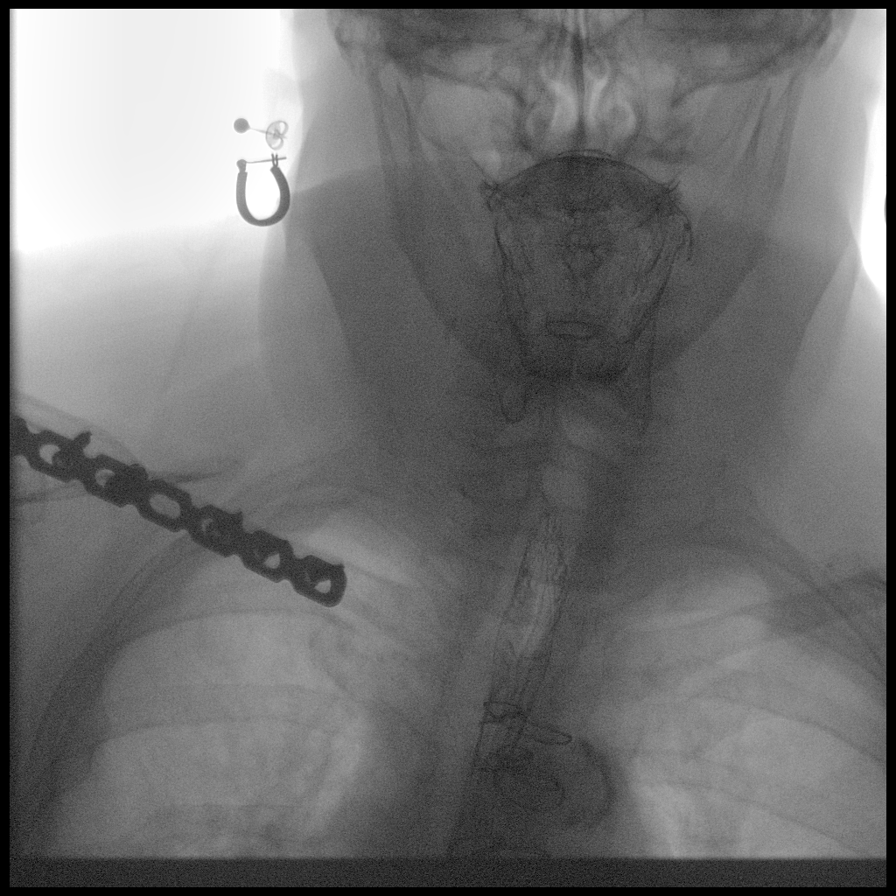

[Series 3: cp_standard · 0.34mm/px · 4 of 74 frames shown (3 of 7)]
[frame 12/74]
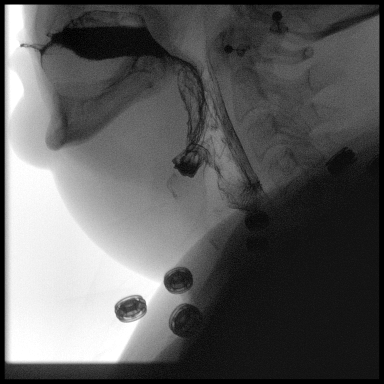
[frame 15/74]
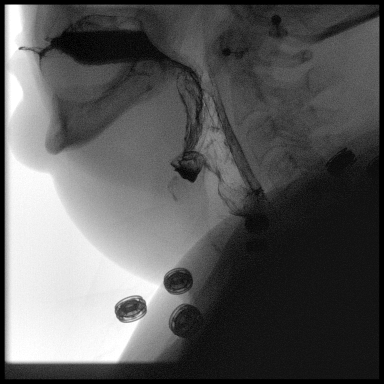
[frame 38/74]
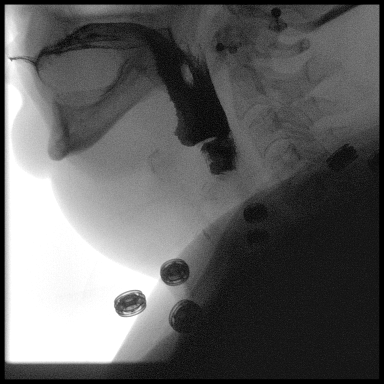
[frame 63/74]
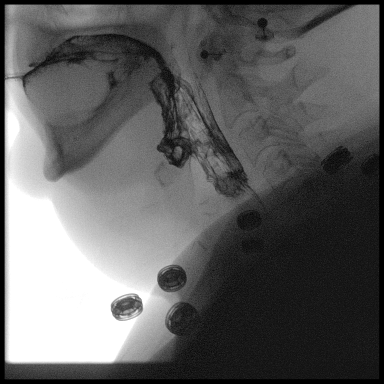

[Series 4: cp_standard · 0.26mm/px · 1 of 1 slices shown (4 of 7)]
[im 1/1]
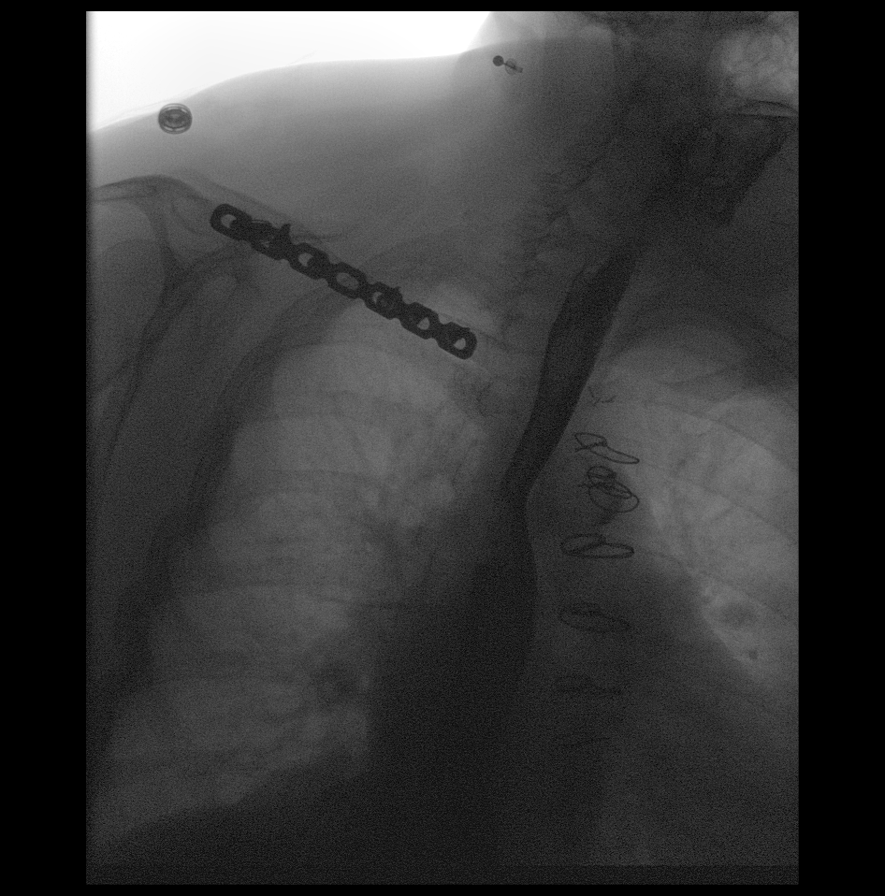

[Series 6: cp_standard · 0.27mm/px · 1 of 1 slices shown (5 of 7)]
[im 1/1]
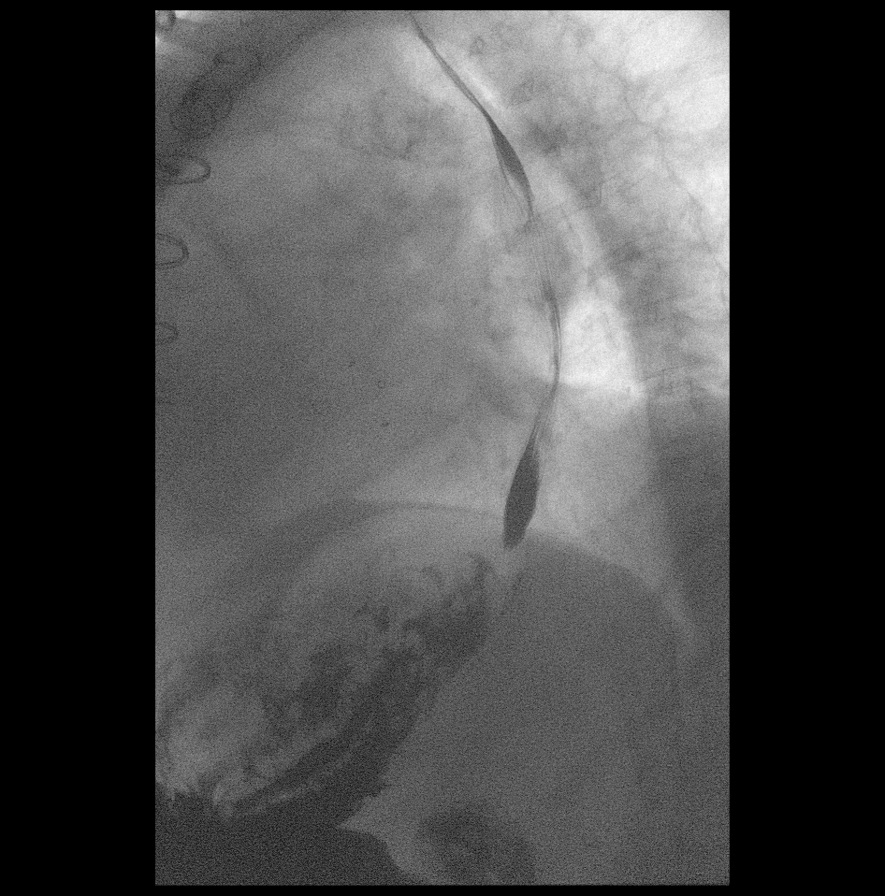

[Series 7: cp_standard · 0.27mm/px · 1 of 1 slices shown (6 of 7)]
[im 1/1]
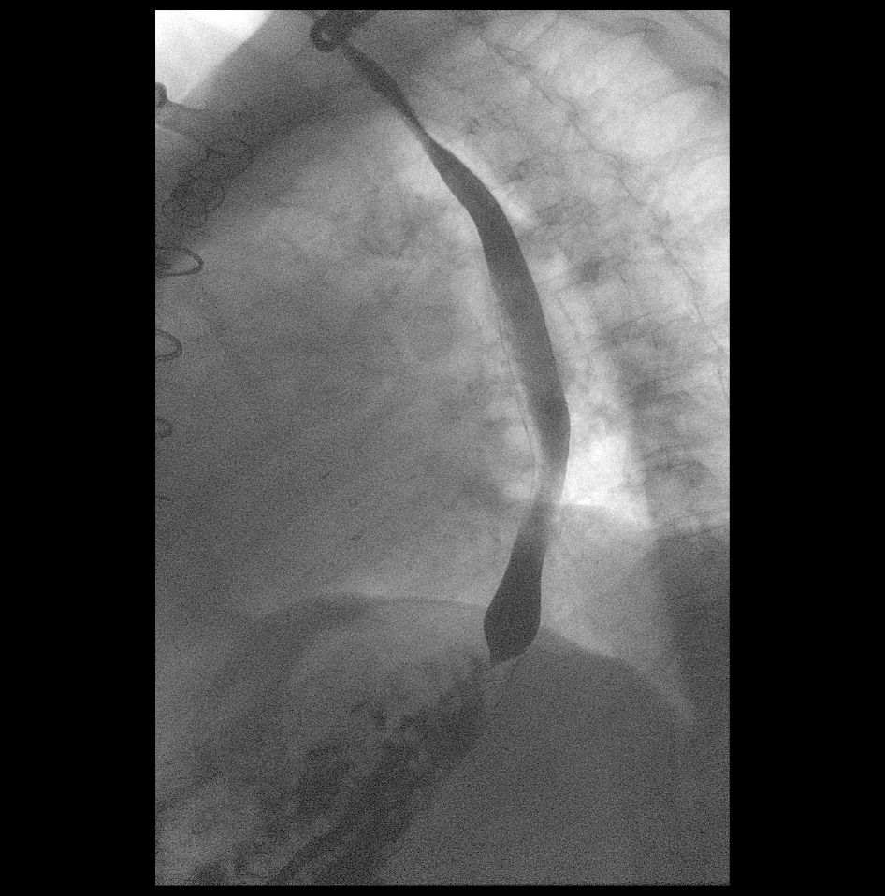

[Series 8: cp_standard · 0.28mm/px · 1 of 1 slices shown (7 of 7)]
[im 1/1]
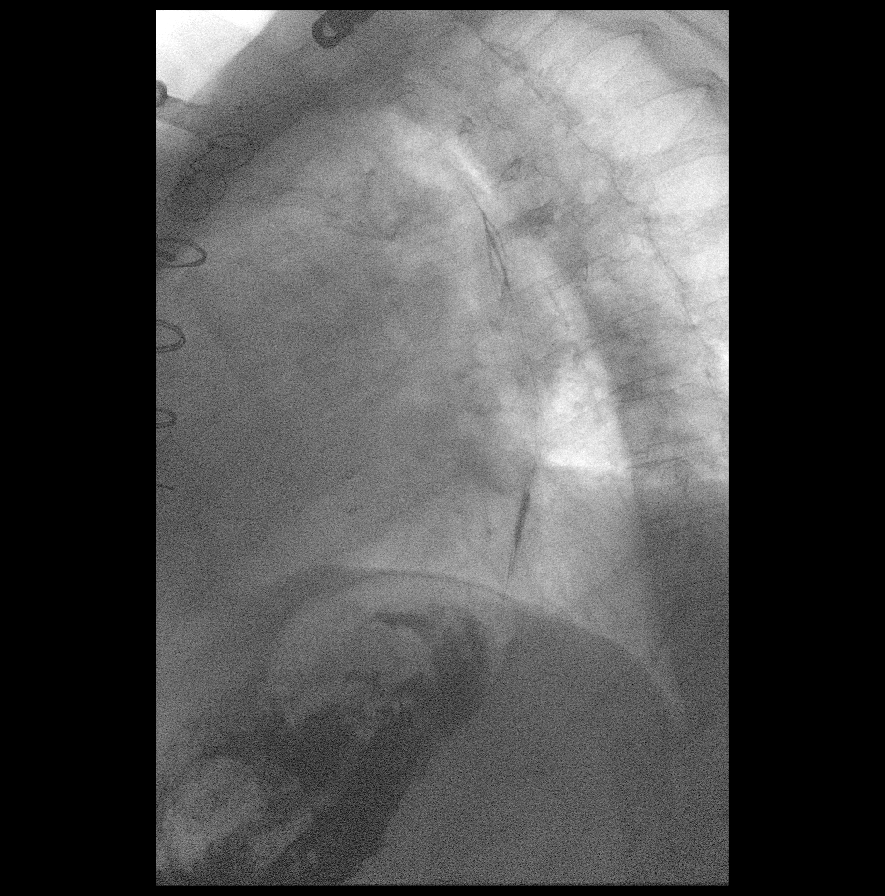

[12 of 14 positions shown; findings below may reference images not displayed]

FINDINGS: Patient seems to have somewhat delayed oral transit times.
Swallowing is otherwise within normal limits. Occasionally sluggish
esophageal motility. No esophageal fold thickening, stricture or
obstruction. Patient was unable to swallow a barium tablet.
IMPRESSION: 1. Somewhat delayed oral transit at times.
2. Probable mild esophageal dysmotility.

## 2019-08-04 NOTE — Patient Outreach (Addendum)
[facility]) Care Management Chronic Special Needs Program  08/04/2019  Name: [name] DOB: [birth year]  MRN: [id number]  [name] is enrolled in a chronic special needs plan for Heart Failure.  Telephone call to client for CSNP assessment follow up. HIPAA verified. Client denies increase symptoms related to heart failure. She reports having a follow up visit with her cardiologist on 05/23/19. Client reports she has not been consistent with weighing daily.  RNCM discussed heart failure signs and symptoms and advised client of the importance of obtaining daily weights.  Client verbalized understanding.      Goals Addressed              This Visit's Progress   .  Blood Pressure < 130/80        Goal renewed 2021 Blood pressure reading at 06/30/19 primary care visit 118/45,  Blood pressure reading at 05/23/19 cardiology visit 130/50.  Client advised to check blood pressure at least 2 times a week and record To help prevent dizziness when lying down  sit on the edge of the bed/ couch, or reclined chair for a minute before standing.      .  Client understands the importance of follow-up with providers by attending scheduled visits   On track     Goal renewed 2021 Primary care provider visit completed 06/30/19 Cardiology visit completed 05/23/19 Continue to follow up with your providers as recommended.     .  Client will report no fall or injuries in the next 3 months.   Not on track     Goal renewed 2021 Client reports having 2 falls within the past 3 months without injury.   Continue to use ambulatory device( cane or walker) as advised by your provider Inside your home: Use plenty of lighting, keep walkways clear of clutter and remove tripping hazards such as cords or throw rugs.  Be aware of small pets when walking.  If you feel dizzy, sit still in a chair or on the bed when waking up. Don't continue to walk around until dizziness resolves.  Call your  doctor if needed.     .  Client will verbalize knowledge of chronic lung disease as evidenced by no ED visits or Inpatient stays related to chronic lung disease    On track     RN case manager will send client education article: COPD exacerbation, Adult education.  And COPD:  Quitting smoking.  Continue to take your medications as prescribed.  Review  [employer] calendar sent in the mail for COPD action plan and information    .  Client will verbalize knowledge of self management of Hypertension as evidences by BP reading of 140/90 or less; or as defined by provider   On track     Goal renewed 2021. Blood pressure reading at 06/30/19 primary care visit 118/45,  Blood pressure reading at 05/23/19 cardiology visit 130/50.  Plan to check your blood pressure at least 2 times per week and record readings. Plan to eat a low salt and heart healthy diet: Fruits, vegetables, whole grains, lean protein and limit fat, and sugars.  Contact your doctor if you have questions Continue to take your blood pressure medications as recommended.      .  Client/Caregiver will verbalize understanding of instructions related to self-care and safety   On track     Goal renewed 2021 Client reports having 2 falls within the past 3 months without injury.  Continue to use ambulatory device( cane or walker) as advised by your provider Inside your home: Use plenty of lighting, keep walkways clear of clutter and remove tripping hazards such as cords or throw rugs.  Be aware of small pets when walking.  If you feel dizzy, sit still in a chair or on the bed when waking up. Don't continue to walk around until dizziness resolves.  Call your doctor if needed.     .  Decrease the use of hospital emergency department related to heart failure within the next year   On track     Goal renewed 2021 RN case manager will send client education article: Heart failure: When to call your doctor or [place] sent in the mail for Heart failure information Signs and symptoms of heart failure reviewed. Client verbalizes understanding.  It is important to weigh daily and write down weights. Pay attention to your body:   if you have any shortness of breath, swelling in your feet, ankles, legs or your waistband gets tight, or you gain 3 pounds overnight or 5 pounds in a week. Report these symptoms to your doctor.  Continue to take your heart failure medication as prescribed.     .  COMPLETED: HEMOGLOBIN A1C < 8        Congratulations, your A1c is below goal at 5.4%     .  Maintain timely refills of Heart Failure medication as prescribed within the year    On track     Goal renewed 2021 Client reports she is maintaining the refills of her medications. Contact your doctor if you have questions. Contact your RN case manager if you have difficulty obtaining your medications.  Client reports she receives [employer] with her medication costs.      .  Obtain annual  Lipid Profile, LDL-C   On track     Lipid profile completed 11/05/18 The goal for LDL is less than 70 mg/ dl. As you are at high risk for complications try to avoid saturated fats, trans-fats and eat more fiber.  RN case manager will send client education article: Heart healthy diet.     .  Patient Stated- "improve my back pain" (pt-stated)   On track     Goal renewed 2021 Client continues to report back pain at baseline on current treatment.  RN case manager will send client education article: Low back pain education Contact your doctor if you have questions.  Continue to take prescribed back pain medication as  recommended by your doctor.     .  COMPLETED: Quit smoking / using tobacco        Client states she does not want this as a goal    .  Visit Primary Care Provider or Cardiologist at least 2 times per year   On track     Goal renewed 2021 Primary care provider visit completed 06/30/19 Cardiology visit completed  05/23/19 Continue to follow up with your providers as recommended.    .  COMPLETED: Weight < 175 lb (79.4 kg)        Client reports her weight ranges between 170-174 lbs.  Clients documented weight at cardiology appointment on 05/23/19 173lbs.        Plan:  Send successful outreach letter with a copy of their individualized care plan, Send individual care plan to provider and Send educational material  Chronic care management coordinator will outreach in:  6 Months   Quinn Plowman  RN,BSN,CCM Chronic Care Management Coordinator [facility] [phone]    .

## 2019-08-09 ENCOUNTER — Encounter: Payer: Self-pay | Admitting: Licensed Clinical Social Worker

## 2019-08-09 ENCOUNTER — Ambulatory Visit (INDEPENDENT_AMBULATORY_CARE_PROVIDER_SITE_OTHER): Payer: HMO | Admitting: Licensed Clinical Social Worker

## 2019-08-09 ENCOUNTER — Other Ambulatory Visit: Payer: Self-pay

## 2019-08-09 ENCOUNTER — Telehealth: Payer: Self-pay | Admitting: *Deleted

## 2019-08-09 DIAGNOSIS — F321 Major depressive disorder, single episode, moderate: Secondary | ICD-10-CM

## 2019-08-09 NOTE — BH Specialist Note (Signed)
Integrated Behavioral Health Visit via Telemedicine (Telephone)  08/09/2019 [name] [id number]   Session Start time: 11;00  Session End time: 12:30 Total time: 30 minutes  Referring Provider: Dr. Lynnae January Type of Visit: Telephonic Patient location: Home [facility] Provider location: Office All persons participating in visit: Patient and [facility]  Confirmed patient's address: Yes  Confirmed patient's phone number: Yes  Any changes to demographics: No   Discussed confidentiality: Yes    The following statements were read to the patient and/or legal guardian that are established with the [facility] Provider.  "The purpose of this phone visit is to provide behavioral health care while limiting exposure to the coronavirus (COVID19).  There is a possibility of technology failure and discussed alternative modes of communication if that failure occurs."  "By engaging in this telephone visit, you consent to the provision of healthcare.  Additionally, you authorize for your insurance to be billed for the services provided during this telephone visit."   Patient and/or legal guardian consented to telephone visit: Yes   PRESENTING CONCERNS: Patient and/or family reports the following symptoms/concerns:  memory issues, anxiety, moderate levels of depression, chronic health,sleep disturbances,and lack of natural resources. Duration of problem: increased over the past 8 years; Severity of problem: moderate  GOALS ADDRESSED: Patient will: 1.  Reduce symptoms of: anxiety, depression and stress  2.  Increase knowledge and/or ability of: coping skills, healthy habits and stress reduction  3.  Demonstrate ability to: Increase healthy adjustment to current life circumstances and Increase adequate support systems for patient/family  INTERVENTIONS: Interventions utilized:  Mindfulness or Relaxation Training, Brief CBT and Supportive Counseling Standardized Assessments completed: assessed  for SI, HI, and self-harm.  ASSESSMENT: Patient currently experiencing moderate levels of depression. Patient reported that she has headaches daily. Patient reported that she is getting new kitchen appliances and washer/dryer. Patient is happy with this home improvement.  Patient was more upbeat today than pervious sessions. Patient reported that she did recently get out of her home and she was able to walk around [place].   Patient may benefit from counseling.  PLAN: 1. Follow up with behavioral health clinician on : two weeks.  Dessie Coma, [facility] [place]

## 2019-08-09 NOTE — Telephone Encounter (Signed)
Received fax from [facility] with INR results from 2/15/2021to 08/08/2019. Last INR 1.8 on 08/08/2019. Results placed in Dr. Florence Canner. Hubbard Hartshorn, BSN, RN-BC

## 2019-08-10 ENCOUNTER — Telehealth: Payer: Self-pay | Admitting: Pharmacist

## 2019-08-10 DIAGNOSIS — J449 Chronic obstructive pulmonary disease, unspecified: Secondary | ICD-10-CM | POA: Diagnosis not present

## 2019-08-10 DIAGNOSIS — I270 Primary pulmonary hypertension: Secondary | ICD-10-CM | POA: Diagnosis not present

## 2019-08-10 DIAGNOSIS — J439 Emphysema, unspecified: Secondary | ICD-10-CM | POA: Diagnosis not present

## 2019-08-10 NOTE — Telephone Encounter (Signed)
Mondays; 2.79m on Wed/Th/Sat//Sun; 1.279mon Tue/Fri. Repeat PST FS POC INR on 6-JUL-21. No bleeding endorsed.

## 2019-08-11 ENCOUNTER — Other Ambulatory Visit (HOSPITAL_COMMUNITY): Payer: Self-pay | Admitting: Cardiology

## 2019-08-11 ENCOUNTER — Other Ambulatory Visit: Payer: Self-pay | Admitting: Internal Medicine

## 2019-08-11 DIAGNOSIS — K219 Gastro-esophageal reflux disease without esophagitis: Secondary | ICD-10-CM

## 2019-08-11 DIAGNOSIS — I5032 Chronic diastolic (congestive) heart failure: Secondary | ICD-10-CM

## 2019-08-12 ENCOUNTER — Telehealth: Payer: Self-pay | Admitting: Dietician

## 2019-08-12 NOTE — Telephone Encounter (Signed)
[name] called to request a diabetes follow up appointment. She says her CGM alarmed twice yesterday for low blood sugar- one in the 80s and one in the 70s. Appointment scheduled for next Tuesday. She agreed to upload her CGM. Call was transferred to front office to schedule and apointment with her PCP

## 2019-08-16 ENCOUNTER — Ambulatory Visit (INDEPENDENT_AMBULATORY_CARE_PROVIDER_SITE_OTHER): Payer: HMO | Admitting: Dietician

## 2019-08-16 ENCOUNTER — Other Ambulatory Visit: Payer: Self-pay | Admitting: *Deleted

## 2019-08-16 ENCOUNTER — Telehealth: Payer: Self-pay | Admitting: Pharmacist

## 2019-08-16 ENCOUNTER — Encounter: Payer: Self-pay | Admitting: Dietician

## 2019-08-16 DIAGNOSIS — Z794 Long term (current) use of insulin: Secondary | ICD-10-CM

## 2019-08-16 DIAGNOSIS — E1140 Type 2 diabetes mellitus with diabetic neuropathy, unspecified: Secondary | ICD-10-CM

## 2019-08-16 NOTE — Telephone Encounter (Signed)
Patient called with results of PST FS POC INR performed today. INR = 1.6 (goal per Dr. Bryson Corona - 2.5). Will continue same regimen. Patient currently taking 1x2.52m on Su/Wed/Th/Sat; 1/2 x 2.564m(1.2526mose) on Tues/Fri; Zero (0) milligrams on Mondays (paient preference). No bleeding or signs/symptoms of embolic disease per the patient. She performs weekly patient self-testing using point of care, finger-stick device.

## 2019-08-16 NOTE — Patient Instructions (Addendum)
Hi [place],   You have done a marvelous job lowering your blood sugars!   I suggest you look at the Review History icon a few times a week and anytime you are wondering "How am I doing? "  The two most important things for you to look at under the Review History icon are:  1- Average Glucose= an average glucose of 154 is about equal to an A1C of a 7%. This is considered target for most people. You can discuss what your target soul be with your doctor.   2- Time in Target= time in target is a new concept in diabetes care. The target for most people is 70% or greater of the time in target.  This tells how little you are low or high; maintaining glucose in a narrower range.   Happy to answer any questions you have!  Butch Penny [phone]

## 2019-08-16 NOTE — Progress Notes (Signed)
Diabetes Self Management Education and Support:   Start time: 1418 pm  End time:  1450 pm  total time: 32 minutes  Telephone visit due to COVID-19.  I connected with  [name] on 08/16/19 by a telephone and verified that I am speaking with the correct person using two identifiers.   I discussed the limitations of evaluation and management by telemedicine. The patient expressed understanding and agreed to proceed.  Her concerns- "high sugar".  Current amount of Novolog 36.5 with 3-4 injections per day.  Victoza- on max amount- 1.8 mg excellent adherence. could try increased amount vs switch to Ozempic. Willing to switch to Ozempic to see if it will lower her basal.   2 readers uploaded CGM Results from download:  last 2 weeks  2 weeks prior  Average glucose:  169 mg/dl   175 mg/dL for 14 days  Glucose management indicator:  7.4 %   7.5 %  Time in range (70-180 mg/dL):  64 %   59 %   (Goal >70%)  Time High (181-250 mg/dL):  33 %   37 %   (Goal < 25%)  Time Very High (>250 mg/dL):   3%   4 %     (Goal < 5%)  Time Low (54-69 mg/dL):  0 %   0 %     (Goal <4%)  Time Very Low (<54 mg/dL):  0 %   0 %    (Goal <1%)  Coefficient of variation:  23.6 %   22.5 %   (Goal <36%)  Patterns:  mostly flat pattern between 140-174m/dl  mostly flat pattern between 150-1969mdl with slight dip between 3am and 12 noon   Low blood sugar symptoms- no symptoms when she had one brief low alarm last week that awoke her and she caught early, knew she had a low because the alarms went off  Food intake:  Ms ElBraatenays she is trying to eat lower carb and make healthful choices. We discussed a possile change to her GLP-1 and sh is will to switch to Ozempic to see if it will help lower her blood sugarsans weight.   GlKeionaas educated about how to review history in her CGM reader and how to understand what it means. She verbalized understanding. PLan follow up in 1 month for continued Diabetes Self  Management Support per patient.   DoDebera LatRD 08/16/2019 2:13 PM.

## 2019-08-18 MED ORDER — VICTOZA 18 MG/3ML ~~LOC~~ SOPN: PEN_INJECTOR | SUBCUTANEOUS | 3 refills | Status: DC

## 2019-08-22 ENCOUNTER — Telehealth: Payer: Self-pay | Admitting: *Deleted

## 2019-08-22 ENCOUNTER — Ambulatory Visit: Payer: HMO | Admitting: Podiatry

## 2019-08-22 ENCOUNTER — Encounter: Payer: Self-pay | Admitting: Podiatry

## 2019-08-22 ENCOUNTER — Other Ambulatory Visit: Payer: Self-pay

## 2019-08-22 ENCOUNTER — Telehealth: Payer: Self-pay | Admitting: Pharmacist

## 2019-08-22 DIAGNOSIS — B351 Tinea unguium: Secondary | ICD-10-CM | POA: Diagnosis not present

## 2019-08-22 DIAGNOSIS — L989 Disorder of the skin and subcutaneous tissue, unspecified: Secondary | ICD-10-CM

## 2019-08-22 DIAGNOSIS — Z7901 Long term (current) use of anticoagulants: Secondary | ICD-10-CM | POA: Diagnosis not present

## 2019-08-22 DIAGNOSIS — I739 Peripheral vascular disease, unspecified: Secondary | ICD-10-CM

## 2019-08-22 DIAGNOSIS — M79674 Pain in right toe(s): Secondary | ICD-10-CM

## 2019-08-22 DIAGNOSIS — M79675 Pain in left toe(s): Secondary | ICD-10-CM

## 2019-08-22 DIAGNOSIS — I480 Paroxysmal atrial fibrillation: Secondary | ICD-10-CM | POA: Diagnosis not present

## 2019-08-22 NOTE — Telephone Encounter (Signed)
PST INR she reported to me of 1.9 (target goal established by Dr. Lynnae January before her departure = 1.5 - 2.5). She has been taking 12.24m warfarin/wk. Will continue same regimen. Repeat PST FS POC INR 1wk on 19-JUL-21. She takes "0" milligrams on Mondays (patient preference); 1/2 x 2.581m(1.2562mose) on Tuesdays/Fridays; Wednesdays, Thursdays, Saturdays and Sundays, she takes 2.5mg62menies any new medications, no symptoms or signs of embolic events or any bleeding events.

## 2019-08-22 NOTE — Telephone Encounter (Signed)
Thank you. I agree 

## 2019-08-22 NOTE — Progress Notes (Signed)
SUBJECTIVE Patient presents to office today complaining of elongated, thickened nails that cause pain while ambulating in shoes.  She is unable to trim her own nails.  Patient is on Eliquis anticoagulant for peripheral vascular disease being managed by Dr. Quay Burow .  Patient states that she does have blockages in her bilateral lower extremities.  Patient is here for further evaluation and treatment.  Past Medical History:  Diagnosis Date  . Adenomatous polyps 05/14/2011   Colonoscopy (05/2011): 4 mm adenomatous polyp excised endoscopically Colonoscopy (02/2002): Adenomatous polyp excised endoscopically   . Allergic rhinitis 06/01/2012  . Anemia of chronic disease 01/01/2013  . Anxiety 07/24/2010  . Aortic atherosclerosis ([place]) 10/19/2014   Seen on CT scan, currently asymptomatic  . 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 Arthritis    "lower back; hands" (02/19/2018)  . Asymptomatic cholelithiasis 09/25/2015   Seen on CT scan 08/2015  . Carotid artery stenosis    s/p right endarterectomy (06/2010) Carotid US (07/2010):  Left: Moderate-to-severe (60-79%) calcific and non-calcific plaque origin and proximal ICA and ECA   . Chronic congestive heart failure with left ventricular diastolic dysfunction ([place]) 10/21/2010  . Chronic constipation 02/03/2011  . Chronic daily headache 01/16/2014  . Chronic headache    "weekly now" (11/13/2017)  . Chronic low back pain 10/06/2012  . Chronic venous insufficiency 08/04/2012  . Clear cell renal cell carcinoma ([place]) 07/21/2011   s/p cryoablation of left RCC in 09/2011 by [name]. Followed by [name]  [facility]) .    Marland Kitchen COPD (chronic obstructive pulmonary disease) with emphysema (HCC)    PFTs 02/2012: FEV1 0.92 (40%), ratio 69, 27% increase in FEV1 with BD, TLC 91%, severe airtrapping, DLCO49% On chronic home O2. Pulmonary rehab referral 05/2012   . Daily  headache    "recently" (02/19/2018)  . Depression 11/19/2005  . Esophageal stenosis 08/08/2015   Mild, benign-appearing on EGD 06/29/2015  . Fibromyalgia 08/29/2010  . Gastroesophageal reflux disease   . History of blood transfusion    "several times"  (02/19/2018)  . History of hiatal hernia   . History of kidney stones   . Hyperlipidemia LDL goal < 100 11/20/2005  . Hypertension   . Internal hemorrhoids 08/04/2012  . Lichen sclerosus of female genitalia 01/12/2017  . Migraine    "none in years" (02/19/2018)  . Mitral stenosis    s/p Mitral valve replacement with a 27-mm pericardial porcine valve (Medtronic Mosaic valve, serial #[id number] on 09/20/10, Dr. Prescott Gum)   . Moderate to severe pulmonary hypertension ([place])    2014 TEE w PA peak pressure 46 mmHg, s/p MV replacement   . Moderately severe major depression ([place]) 11/19/2005  . Obesity (BMI 30.0-34.9) 10/23/2011  . Obstructive sleep apnea    Nocturnal polysomnography (06/2009): Moderate sleep apnea/ hypopnea syndrome , AHI 17.8 per hour with nonpositional hypopneas. CPAP titration to 12 CWP, AHI 2.4 per hour. On nocturnal CPAP via a small resMed Quattro full-face mask with heated humidifier.   . On home oxygen therapy    "2L O2 w/CPAP when I sleep" (02/19/2018)  . OSA on CPAP    "2L O2 w/CPAP when I sleep" (02/19/2018)  . Osteoporosis    DEXA (12/09/2011): L-spine T -3.7, left hip T -1.4 DEXA (12/2004): L-spine T -2.6, left hip -0.1   . Paroxysmal atrial fibrillation ([place]) 10/22/2010   s/p Left atrial maze procedure for paroxysmal atrial fibrillation on 09/20/2010 by  Dr Prescott Gum.  Subsequent splenic infarct, decision was made to re-anticoagulate with coumadin, likely life-long as this is the most likely cause of the splenic infarct.   . Pneumonia    "once"  (02/19/2018)  . Pulmonary hypertension due to chronic obstructive pulmonary disease ([place]) 04/25/2016  . Right nephrolithiasis 09/06/2014   5 mm non-obstructing calculus seen on CT scan  09/05/2014   . Seborrheic keratosis 09/28/2015  . Severe obesity (BMI 35.0-39.9) with comorbidity ([place]) 10/23/2011  . Shortness of breath dyspnea   . Tobacco abuse 07/28/2012  . Type 2 diabetes mellitus with diabetic neuropathy ([place])     OBJECTIVE General Patient is awake, alert, and oriented x 3 and in no acute distress. Derm Skin is dry and supple bilateral. Negative open lesions or macerations. Remaining integument unremarkable. Nails are tender, long, thickened and dystrophic with subungual debris, consistent with onychomycosis, 1-5 bilateral. No signs of infection noted.  Hyperkeratotic preulcerative callus lesions also noted to the tips of the toes bilateral.  There are also symptomatic hyperkeratotic calluses noted to the subfirst MTPJ of the bilateral feet. Vasc  DP and PT pedal pulses diminished bilaterally. Temperature gradient within normal limits.  Delayed capillary refill bilateral Neuro Epicritic and protective threshold sensation grossly intact bilaterally.  Musculoskeletal Exam No symptomatic pedal deformities noted bilateral. Muscular strength within normal limits.  ASSESSMENT 1. Onychodystrophic nails 1-5 bilateral with hyperkeratosis of nails.  2. Onychomycosis of nail due to dermatophyte bilateral 3. Pain in foot bilateral 4.  Preulcerative callus lesions bilateral feet 5.  Peripheral vascular disease  PLAN OF CARE 1. Patient evaluated today.  2. Instructed to maintain good pedal hygiene and foot care.  3. Mechanical debridement of nails 1-5 bilaterally performed using a nail nipper. Filed with dremel without incident.  4.  Excisional debridement of the hyperkeratotic calluses was performed using a chisel blade without incident or bleeding  5.  Patient admits to going barefoot around the house.  I advised against this and recommend good supportive sneakers  6.  Return to clinic in 3 mos.    Edrick Kins, DPM [facility]  Dr. Edrick Kins, [address]                                        [place], [place] [address]                Office [phone]  Fax [phone]

## 2019-08-22 NOTE — Telephone Encounter (Signed)
Pt calls and states she has a problem with incont. bm when she passes gas. States this has just started in the past 1 month possibly. States when she passes gas she has a little ooze of bm, it is bothersome and she is very concerned. She is reassured and encouraged to speak w/ dr [name] at her appt 7/22, she is agreeable

## 2019-08-23 ENCOUNTER — Ambulatory Visit (INDEPENDENT_AMBULATORY_CARE_PROVIDER_SITE_OTHER): Payer: HMO | Admitting: Licensed Clinical Social Worker

## 2019-08-23 DIAGNOSIS — F321 Major depressive disorder, single episode, moderate: Secondary | ICD-10-CM

## 2019-08-23 NOTE — Telephone Encounter (Signed)
Thank you for the heads up, I'll be happy to discuss this concern with her at her appt.

## 2019-08-23 NOTE — BH Specialist Note (Signed)
Integrated Behavioral Health Visit via Telemedicine (Telephone)  08/23/2019 [name] [id number]   Session Start time: 35;00  Session End time: 12:30 Total time: 30 minutes  Referring Provider: Dr. Lynnae January Type of Visit: Telephonic Patient location: Home [facility] Provider location: Office All persons participating in visit: Patient and [facility]  Confirmed patient's address: Yes  Confirmed patient's phone number: Yes  Any changes to demographics: No   Discussed confidentiality: Yes    The following statements were read to the patient and/or legal guardian that are established with the [facility] Provider.  "The purpose of this phone visit is to provide behavioral health care while limiting exposure to the coronavirus (COVID19).  There is a possibility of technology failure and discussed alternative modes of communication if that failure occurs."  "By engaging in this telephone visit, you consent to the provision of healthcare.  Additionally, you authorize for your insurance to be billed for the services provided during this telephone visit."   Patient and/or legal guardian consented to telephone visit: Yes   PRESENTING CONCERNS: Patient and/or family reports the following symptoms/concerns: memory issues, anxiety, moderate levels of depression, chronic health,sleep disturbances,and lack of natural resources. Duration of problem: increase over the past 8 years; Severity of problem: moderate  GOALS ADDRESSED: Patient will: 1.  Reduce symptoms of: anxiety, depression and stress  2.  Increase knowledge and/or ability of: coping skills, healthy habits and stress reduction  3.  Demonstrate ability to: Increase healthy adjustment to current life circumstances and Increase adequate support systems for patient/family  INTERVENTIONS: Interventions utilized:  Motivational Interviewing, Brief CBT and Supportive Counseling Standardized Assessments completed: Not  Needed  ASSESSMENT: Patient currently experiencing moderate levels of depression.   Patient continues to struggle with loneliness. Patient spends the majority of her time individually. Patient processed an upcoming event that she is excited about. Patient still laughs and has a good sense of humor. Patient processed one of her grandchildren, and how she worries about him. Patient continues to sleep as a way to cope with her depression. Patient reported she wish she could quit smoking, but she does not see this as a possibility.   Patient may benefit from counseling.  PLAN: 1. Follow up with behavioral health clinician on : two weeks.  Dessie Coma, [facility], [place]

## 2019-08-26 ENCOUNTER — Ambulatory Visit: Payer: HMO | Admitting: Podiatry

## 2019-08-29 ENCOUNTER — Other Ambulatory Visit: Payer: Self-pay | Admitting: Student in an Organized Health Care Education/Training Program

## 2019-08-29 ENCOUNTER — Telehealth: Payer: Self-pay | Admitting: Pharmacist

## 2019-08-29 ENCOUNTER — Other Ambulatory Visit: Payer: Self-pay

## 2019-08-29 DIAGNOSIS — J439 Emphysema, unspecified: Secondary | ICD-10-CM

## 2019-08-29 DIAGNOSIS — M545 Low back pain, unspecified: Secondary | ICD-10-CM

## 2019-08-29 DIAGNOSIS — G8929 Other chronic pain: Secondary | ICD-10-CM

## 2019-08-29 DIAGNOSIS — I5032 Chronic diastolic (congestive) heart failure: Secondary | ICD-10-CM

## 2019-08-29 NOTE — Telephone Encounter (Signed)
Patient was contacted relative to her PST FS POC INR performed earlier today, INR = 1.6 on 12.19m warfarin/wk. Will CONTINUE same regimen and repeat in one-week. Target goal 1.5 - 2.5 per Dr. BLynnae Januaryand patient agreement. Patient performs patient self-testing, finger-stick point of care INRs weekly with home monitor, texts me results and I dose adjust accordingly.

## 2019-08-29 NOTE — Telephone Encounter (Signed)
Requesting to speak with a nurse, please call pt back.

## 2019-08-29 NOTE — Telephone Encounter (Signed)
HYDROcodone-acetaminophen (NORCO/VICODIN) 5-325 MG tablet, REFILL REQUEST @  [id number] - [address], [place] AT [address] Phone:  [phone]  Fax:  [phone]

## 2019-08-29 NOTE — Telephone Encounter (Signed)
Pt had called about potassium rx; stated had call [facility] and they did not received rx even though it was refilled 07/13/19 #270 x 3 RF's.  I called [facility] - Eddie stated his system showed it was "approved" but he's unable to retrieve it. He's asking if the doctor could re-send rx.  Pt called to informed of the above - no answer.

## 2019-08-29 NOTE — Telephone Encounter (Signed)
I agree with Dr. Gladstone Pih recommendations.

## 2019-08-29 NOTE — Telephone Encounter (Signed)
Last rx written  05/23/19. Last OV  05/12/19. Next OV  09/01/19. UDS  01/05/19.

## 2019-08-29 NOTE — Telephone Encounter (Signed)
Informed pt of conversation with [facility] about Potassium rx - see previous encounter from today.

## 2019-08-30 ENCOUNTER — Encounter: Payer: Self-pay | Admitting: Internal Medicine

## 2019-08-30 ENCOUNTER — Other Ambulatory Visit: Payer: Self-pay

## 2019-08-30 ENCOUNTER — Ambulatory Visit (INDEPENDENT_AMBULATORY_CARE_PROVIDER_SITE_OTHER): Payer: HMO | Admitting: Licensed Clinical Social Worker

## 2019-08-30 DIAGNOSIS — F321 Major depressive disorder, single episode, moderate: Secondary | ICD-10-CM

## 2019-08-30 DIAGNOSIS — I43 Cardiomyopathy in diseases classified elsewhere: Secondary | ICD-10-CM | POA: Insufficient documentation

## 2019-08-30 DIAGNOSIS — E854 Organ-limited amyloidosis: Secondary | ICD-10-CM | POA: Insufficient documentation

## 2019-08-30 NOTE — BH Specialist Note (Signed)
Integrated Behavioral Health Visit via Telemedicine (Telephone)  08/30/2019 [name] [id number]   Session Start time: 11:32  Session End time: 12:00 Total time: 28 minutes  Referring Provider: Dr. Lynnae January Type of Visit: Telephonic Patient location: Home [facility] Provider location: Office All persons participating in visit: Patient and [facility]  Confirmed patient's address: Yes  Confirmed patient's phone number: Yes  Any changes to demographics: No   Discussed confidentiality: Yes    The following statements were read to the patient and/or legal guardian that are established with the [facility] Provider.  "The purpose of this phone visit is to provide behavioral health care while limiting exposure to the coronavirus (COVID19).  There is a possibility of technology failure and discussed alternative modes of communication if that failure occurs."  "By engaging in this telephone visit, you consent to the provision of healthcare.  Additionally, you authorize for your insurance to be billed for the services provided during this telephone visit."   Patient and/or legal guardian consented to telephone visit: Yes   PRESENTING CONCERNS: Patient and/or family reports the following symptoms/concerns: memory issues, anxiety, moderate levels of depression, chronic health,sleep disturbances,and lack of natural resources. Duration of problem: increase over the past 8 years; Severity of problem: moderate  GOALS ADDRESSED: Patient will: 1.  Reduce symptoms of: anxiety, depression and stress  2.  Increase knowledge and/or ability of: coping skills, healthy habits and stress reduction  3.  Demonstrate ability to: Increase healthy adjustment to current life circumstances and Increase adequate support systems for patient/family  INTERVENTIONS: Interventions utilized:  Mindfulness or Relaxation Training, Brief CBT and Supportive Counseling Standardized Assessments completed: Not  Needed  ASSESSMENT: Patient currently experiencing moderate levels of depression. Patient reported she struggles with change, and she is worried about having a new doctor. Patient processed ongoing issues with her health, and understands that she will need to explore her concerns with her new doctor. Patient reported her desire to eventually go to an assisted living or have help around her home. Patient reported that she is not physically in need of this transition yet, and worries how she will do this financially.   Patient may benefit from counseling.  PLAN: 1. Follow up with behavioral health clinician on : two weeks.  Dessie Coma, [facility], [place]

## 2019-08-31 ENCOUNTER — Encounter: Payer: Self-pay | Admitting: Internal Medicine

## 2019-08-31 DIAGNOSIS — G4734 Idiopathic sleep related nonobstructive alveolar hypoventilation: Secondary | ICD-10-CM | POA: Insufficient documentation

## 2019-08-31 DIAGNOSIS — D509 Iron deficiency anemia, unspecified: Secondary | ICD-10-CM | POA: Insufficient documentation

## 2019-08-31 DIAGNOSIS — J449 Chronic obstructive pulmonary disease, unspecified: Secondary | ICD-10-CM | POA: Insufficient documentation

## 2019-08-31 DIAGNOSIS — I6529 Occlusion and stenosis of unspecified carotid artery: Secondary | ICD-10-CM | POA: Insufficient documentation

## 2019-08-31 NOTE — Assessment & Plan Note (Signed)
Significant iron deficiency noted on last testing, to be repeated today as she has been treated with daily iron supplement.  If she remains low despite adherence, this is evidence that she is having difficulty with absorption, and iron infusion would be recommended as her cardiorespiratory status is already reduced due to multimorbidities.

## 2019-08-31 NOTE — Assessment & Plan Note (Addendum)
As polyps typically require colonoscopy follow-up every 1 to 3 years, it appears she has been lost to follow-up.  I will inquire about what her plan should have been, and discuss with her her own goals for screening.

## 2019-08-31 NOTE — Assessment & Plan Note (Addendum)
Diabetes in this older woman with significant multimorbidity has been overly controlled.  Download of glucometer indicates that she has had fewer episodes of hypoglycemia.  Follow-up A1c today.  Goal will be to will be eliminate mealtime coverage in this type II to simplify regimen. She is at maximum dose of which we will continue at this time.  I will eventually reduce her CBG testing to fasting only.  And minimize frequency during the week.. Order diabetic shoes.

## 2019-08-31 NOTE — Assessment & Plan Note (Addendum)
Due for vitamin D level today.  She appears to be at risk for falls, having experienced this in the past, and as such would be high risk for fracture.  If vitamin D is replete, she would benefit from bisphosphonate, if she is willing and no contraindications.  The chronic twice daily PPI is a risk factor for osteoporosis and my goal will be to gradually transition her to an H2 B, if any treatment is needed at all.

## 2019-08-31 NOTE — Assessment & Plan Note (Signed)
Due for lipid testing today.She is on rosuvastatin 20 mg daily.

## 2019-08-31 NOTE — Assessment & Plan Note (Addendum)
Referred to interventional cardiologist Dr. Donnella Bi earlier this year; ABIs are in normal range, though great toe perfusion is in a range consistent with potential claudication pain.  Recommendation is for continued risk factor management, no indication for intervention at this time.  Note that feet and toes are dusky, cool but not cold, dp pulses barely palpable.

## 2019-08-31 NOTE — Assessment & Plan Note (Signed)
>>  ASSESSMENT AND PLAN FOR CHRONIC CHF (CONGESTIVE HEART FAILURE) (HCC) WRITTEN ON 09/01/2019  3:03 PM BY Miguel Aschoff, MD  Compensated. Recent visit with cardiologist Dr. Ronelle Nigh.  BMP today.   Cardiology follow-up is next month.  Note there is a work-up underway for cardiac amyloidosis.  [name] reports that because the recent test was inconclusive, it has been recommended to be repeated in 34m.

## 2019-08-31 NOTE — Assessment & Plan Note (Addendum)
Rate currently well controlled with beta-blocker.  Note anticoagulation with warfarin, though on review of Dr. Oleh Genin notes, he agrees that a DOAC is a recommended alternative.  This was certainly simplify her management. When I discussed this with her, she was adamant that she didn't want to change to a DOAC. This was because she was told, in 2012 upon experiencing a splenic embolism following an A. fib procedure, that she should stay on Coumadin for life. She also recalls that a celebrity in an Eliquis advertisement died, and she does not want to have the same outcome. I reminded her that the celebrity did not die because of Eliquis, and that these agents are more commonly used given there safety profile and effectiveness. She says she was not aware of this and wants to think about it. We will continue conversations.

## 2019-08-31 NOTE — Assessment & Plan Note (Signed)
I am happy to manage her toenails and feet exams here in the [facility] clinic.  We will maintain podiatry expertise for unexpected complications.

## 2019-08-31 NOTE — Progress Notes (Signed)
Established Patient Office Visit  Subjective:  Patient ID: [name], female    DOB: 06/20/1950  Age: 69 y.o. MRN: [id number]  CC: DM2; HTN; incontinence of feces  HPI [name] presents for first visit with her new PCP, and to follow-up on her chronic medical conditions including hypertension, T2 DM, severe COPD nonoxygen requiring during the day, chronic right-sided heart failure, and others.  [name] is a 69 year old divorced woman who lives alone.  In the past 6 months, her most active medical problems have been bilateral hand pain for which she underwent x-rays which showed mild degenerative changes for which she is using topical NSAID; chronic heart failure evaluated by Dr. Aundra Dubin who is working her up for possible cardiac amyloidosis; and moderate depression with anxiety for which she is receiving counseling here at the IM center with behavioral health specialist.  [name] recently underwent a follow-up split-night sleep study to evaluate her OSA for which she has worn CPAP.  Study performed 07/2019 showed that she had resolution of her OSA (see report) though does have nocturnal desaturations for which nocturnal oxygen supplementation at 2 L is recommended.  She does not agree with the findings of resolution, and has continued to use CPAP.  She sees podiatrist for trimming of her toenails.  When asked what her most immediate healthcare concerns were, she immediately said "I have pain all over, I cannot ever get comfortable".  She also discussed her problem of fecal leakage when she is voiding for the past 2 months.  Further details are in problem based charting.    Geriatric syndromes: History of falls Mixed urinary incontinence Insomnia  Health maintenance: Mammogram 11/2018, normal Vision evaluation 10/31/2018, no retinopathy Colonoscopy 11/2011 with polypectomy (lost to follow-up due to unpaid bill)  Flu vaccine 11/2018 Covid  vaccination-never Bone density study 2016 showing osteoporosis  Past Medical History:  Diagnosis Date  . Anxiety 07/24/2010  . Aortic atherosclerosis ([place]) 10/19/2014   Seen on CT scan, currently asymptomatic  . 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 Arthritis    "lower back; hands" (02/19/2018)  . Asymptomatic cholelithiasis 09/25/2015   Seen on CT scan 08/2015  . Carotid artery stenosis; s/p R endarterectomy    s/p right endarterectomy (06/2010) Carotid US (07/2010):  Left: Moderate-to-severe (60-79%) calcific and non-calcific plaque origin and proximal ICA and ECA   . Chronic congestive heart failure with left ventricular diastolic dysfunction ([place]) 10/21/2010  . Chronic constipation 02/03/2011  . Chronic daily headache 01/16/2014  . Chronic iron deficiency anemia   . Chronic low back pain 10/06/2012  . Chronic venous insufficiency 08/04/2012  . COPD (chronic obstructive pulmonary disease) with emphysema (HCC)    PFTs 2018: severe obstructive disease, insignif response to bronchiodilator, mild restriction parenchymal pattern, moderately severe diffusion defect. 2014  FEV1 0.92 (40%), ratio 69, 27% increase in FEV1 with BD, TLC 91%, severe airtrapping, DLCO49% On chronic home O2. Pulmonary rehab referral 05/2012   . Fibromyalgia 08/29/2010  . Gastroesophageal reflux disease   . History of blood transfusion    "several times"  (02/19/2018)  . History of clear cell renal cell carcinoma ([place]), in remission 07/21/2011   s/p cryoablation of left RCC in 09/2011 by [name]. Followed by [name]  [facility]) .    Marland Kitchen History of hiatal hernia   . History of mitral valve replacement with bioprosthetic valve due to mitral stenosis 2012  s/p MVR with a 27-mm pericardial porcine valve (Medtronic Mosaic valve, serial [id number] on 09/20/10, Dr. Prescott Gum)   . History of obstructive sleep apnea, resolved  2013   resolved per sleep study 07/2019; no apnea, but did have desaturation.  CPAP no longer necessary.  Nocturnal polysomnography (06/2009): Moderate sleep apnea/ hypopnea syndrome , AHI 17.8 per hour with nonpositional hypopneas. CPAP titration to 12 CWP, AHI 2.4 per hour. On nocturnal CPAP via a small resMed Quattro full-face mask with heated humidifier.   Marland Kitchen History of pneumonia    "once"  (02/19/2018)  . History of seborrheic keratosis 09/28/2015  . Hyperlipidemia LDL goal < 100 11/20/2005  . Lichen sclerosus of female genitalia 01/12/2017  . Migraine    "none in years" (02/19/2018)  . Moderately severe major depression ([place]) 11/19/2005  . Nocturnal hypoxia per sleep study 07/2019   . Osteoporosis    DEXA 2016: T -2.7; DEXA (12/09/2011): L-spine T -3.7, left hip T -1.4 DEXA (12/2004): L-spine T -2.6, left hip -0.1   . Paroxysmal atrial fibrillation ([place]) 10/22/2010   s/p Left atrial maze procedure for paroxysmal atrial fibrillation on 09/20/2010 by Dr Prescott Gum.  Subsequent splenic infarct, decision was made to re-anticoagulate with coumadin, likely life-long as this is the most likely cause of the splenic infarct.   . Personal history of colonic polyps 05/14/2011   Colonoscopy (05/2011): 4 mm adenomatous polyp excised endoscopically Colonoscopy (02/2002): Adenomatous polyp excised endoscopically   . Pulmonary hypertension due to chronic obstructive pulmonary disease ([place]) 04/25/2016   2014 TEE w PA peak pressure 46 mmHg, s/p MV replacement   . Right nephrolithiasis, asymptomatic, incidental finding 09/06/2014   5 mm non-obstructing calculus seen on CT scan 09/05/2014   . Severe obesity (BMI 35.0-39.9) with comorbidity ([place]) 10/23/2011  . Tobacco abuse 07/28/2012  . Type 2 diabetes mellitus with diabetic neuropathy (HCC)     Family History  Problem Relation Age of Onset  . Peptic Ulcer Disease Father   . Heart attack Father 87       Died of MI at age 35  . Heart attack Brother 92       Died  of MI at age 9  . Obesity Brother   . Pneumonia Mother   . Healthy Sister   . Lupus Daughter   . Obsessive Compulsive Disorder Daughter     Social History   Socioeconomic History  . Marital status: Divorced    Spouse name: Not on file  . Number of children: Not on file  . Years of education: Not on file  . Highest education level: Not on file  Occupational History  . Not on file  Tobacco Use  . Smoking status: Current Every Day Smoker    Packs/day: 1.00    Years: 55.00    Pack years: 55.00    Types: Cigarettes    Start date: 11/01/1962  . Smokeless tobacco: Never Used  Vaping Use  . Vaping Use: Never used  Substance and Sexual Activity  . Alcohol use: Not Currently    Comment: 02/19/2018 "nothing since 1999"  . Drug use: Not Currently    Types: Cocaine, Marijuana    Comment: 02/19/2018 "nothing since the 1990s"           Other Topics Concern  . Not on file  Social History Narrative   Lives alone in [place] ([place])   Worked at [employer] for 18 years   No car     Outpatient  Medications Prior to Visit  Medication Sig Dispense Refill  . albuterol (PROAIR HFA) 108 (90 Base) MCG/ACT inhaler Inhale 2 puffs into the lungs every 6 (six) hours as needed for shortness of breath. 18 g 5  . ALPRAZolam (XANAX) 1 MG tablet TAKE 1 TABLET BY MOUTH EVERY NIGHT AT BEDTIME AS NEEDED FOR INSOMNIA. MAY TAKE 1/2 TABLET BY MOUTH DURING THE DAY FOR ANXIETY 55 tablet 4  . benzonatate (TESSALON) 100 MG capsule Take 1 capsule (100 mg total) by mouth 3 (three) times daily as needed for cough. 90 capsule 11  . Blood Glucose Monitoring Suppl (ONETOUCH VERIO) W/DEVICE KIT 1 each by Does not apply route 4 (four) times daily. 1 kit 0  . buPROPion (WELLBUTRIN) 75 MG tablet Take 1 tablet (75 mg total) by mouth 2 (two) times daily. 180 tablet 3  . chlorpheniramine (CHLOR-TRIMETON) 4 MG tablet Take 4 mg by mouth daily as needed for allergies.    . clobetasol cream (TEMOVATE) 2.02 %  Apply 1 application topically 2 (two) times daily as needed (lichen sclerosus flare). 45 g 3  . Continuous Blood Gluc Sensor (FREESTYLE LIBRE 2 SENSOR) MISC 1 each by Does not apply route 6 (six) times daily. To read 6 times daily with change of sensor every 2 weeks, 90 days worth with 3 refills 7 each 3  . DULoxetine (CYMBALTA) 60 MG capsule TAKE 1 CAPSULE BY MOUTH 2 TIMES A DAY 180 capsule 3  . ferrous gluconate (FERGON) 324 MG tablet Take 1 tablet (324 mg total) by mouth 2 (two) times daily with a meal. (Patient taking differently: Take 648 mg by mouth See admin instructions. Take 2 tablets (648 mg) by mouth daily with lunch or supper) 180 tablet 3  . fluticasone (FLONASE) 50 MCG/ACT nasal spray INSTILL 2 SPRAYS INTO BOTH NOSTRILS DAILY 54 g 3  . Fluticasone-Salmeterol (ADVAIR) 500-50 MCG/DOSE AEPB INHALE 1 PUFF INTO THE LUNGS TWICE DAILY 180 each 3  . furosemide (LASIX) 80 MG tablet TAKE 1 TABLET BY MOUTH EVERY MORNING AND TAKE 1/2 TABLET EVERY EVENING 45 tablet 11  . gabapentin (NEURONTIN) 300 MG capsule Take 2 capsules (600 mg total) by mouth 3 (three) times daily. 540 capsule 3  . glucose blood (ONETOUCH VERIO) test strip Use to check blood sugar 4 times daily. diag code E11.40. Insulin dependent 375 each 3  . HYDROcodone-acetaminophen (NORCO/VICODIN) 5-325 MG tablet Take 2 tablets by mouth every 6 (six) hours as needed for severe pain. 114 tablet 0  . hydrocortisone cream 1 % APPLY EXTERNALLY TO THE AFFECTED AREA TWICE DAILY (Patient taking differently: APPLY EXTERNALLY TO THE AFFECTED AREA TWICE DAILY prn) 28.35 g 2  . insulin aspart (NOVOLOG FLEXPEN) 100 UNIT/ML FlexPen INJECT 5 UNITS TO 20 UNITS SUBCUTANEOUSLY FOUR TIMES A DAY BEFORE MEALS AND AT BEDTIME 15 mL 5  . Insulin Pen Needle 32G X 4 MM MISC Use to inject insulin up to 6 times a day 500 each 3  . Insulin Syringe-Needle U-100 31G X 15/64" 0.5 ML MISC Use to inject insulin up to 4 times a day 400 each 3  . ipratropium-albuterol  (DUONEB) 0.5-2.5 (3) MG/3ML SOLN INHALE THE CONTENTS OF 1 VIAL VIA NEBULIZER EVERY 6 HOURS AS NEEDED FOR SHORTNESS OF BREATH 360 mL 3  . Lancets Misc. (ACCU-CHEK FASTCLIX LANCET) KIT Check your blood 4 times a day dx code 250.00 insulin requiring 1 kit 2  . liraglutide (VICTOZA) 18 MG/3ML SOPN INJECT 1.8MG SUBCUTANEOUSLY DAILY 18 mL 3  . metoprolol  succinate (TOPROL-XL) 50 MG 24 hr tablet Take 1 tablet (50 mg total) by mouth daily. Take with or immediately following a meal. 90 tablet 3  . nystatin cream (MYCOSTATIN) APPLY TO AFFECTED AREA TOPICALLY TWICE DAILY 30 g 3  . omeprazole (PRILOSEC) 40 MG capsule TAKE ONE CAPSULE BY MOUTH TWICE DAILY 180 capsule 1  . ondansetron (ZOFRAN) 4 MG tablet TAKE 1 TABLET BY MOUTH EVERY 8 HOURS AS NEEDED FOR NAUSEA OR VOMITING 20 tablet 2  . OXYGEN Inhale 2 L into the lungs as needed (for shortness of breath while active).     . potassium chloride SA (KLOR-CON) 20 MEQ tablet TAKE 2 TABLETS BY MOUTH EVERY MORNING AND TAKE 1 TABLET BY MOUTH EVERY EVENING 270 tablet 3  . PRESCRIPTION MEDICATION Inhale into the lungs at bedtime. CPAP    . rosuvastatin (CRESTOR) 20 MG tablet TAKE 1 TABLET BY MOUTH ONCE EVERY NIGHT AT BEDTIME 90 tablet 3  . Tiotropium Bromide Monohydrate (SPIRIVA RESPIMAT) 2.5 MCG/ACT AERS Inhale 2 puffs into the lungs daily. 12 g 1  . warfarin (COUMADIN) 2.5 MG tablet Take 2.77m warfarin on We/Th/Sa/Su; 056mon Mondays; 1.2582m1/2 x 2.5mg6mblet) on Tu/Fri. 20 tablet 1   Facility-Administered Medications Prior to Visit  Medication Dose Route Frequency Provider Last Rate Last Admin  . senna-docusate (Senokot-S) tablet 1 tablet  1 tablet Oral QHS PRN HoffKalman Shanliff, DO        Allergies  Allergen Reactions  . Lorazepam   . Lorazepam Other (See Comments)    Patient's sister noted that ativan caused the patient to become extremely confused during hospitalization 09/2010; tolerates Xanax  . Morphine And Related Other (See Comments)     Injection site reaction  . Oxycontin [Oxycodone] Other (See Comments)    headache  . Tramadol Hcl Swelling    Ankle swelling    ROS Review of Systems  Constitutional: Positive for fatigue. Negative for unexpected weight change.  HENT: Positive for rhinorrhea. Negative for hearing loss, nosebleeds, postnasal drip, sinus pressure and sneezing.   Eyes: Negative for visual disturbance.  Respiratory: Positive for shortness of breath. Negative for cough, choking and chest tightness.   Cardiovascular: Negative for chest pain, palpitations and leg swelling.  Gastrointestinal: Positive for abdominal distention and abdominal pain. Negative for anal bleeding, blood in stool, constipation, diarrhea, nausea, rectal pain and vomiting.  Endocrine: Negative for polyuria.  Genitourinary: Negative for difficulty urinating and dysuria.  Musculoskeletal: Positive for arthralgias, back pain and myalgias.  Skin: Positive for rash.  Neurological: Negative for dizziness, syncope, light-headedness and headaches.  Psychiatric/Behavioral: Positive for sleep disturbance. Negative for behavioral problems, confusion, decreased concentration, dysphoric mood and suicidal ideas. The patient is nervous/anxious. The patient is not hyperactive.       Objective:    Physical Exam  BP (!) 148/44 (BP Location: Left Arm, Patient Position: Sitting, Cuff Size: Normal)   Pulse 68   Temp 98.4 F (36.9 C) (Oral)   Wt 169 lb 4.8 oz (76.8 kg)   SpO2 98% Comment: on RA @ rest  BMI 30.97 kg/m  Wt Readings from Last 3 Encounters:  07/23/19 170 lb (77.1 kg)  06/30/19 174 lb 4.8 oz (79.1 kg)  05/23/19 173 lb 9.6 oz (78.7 kg)   Ms. ElkhWoodfordives in a transport chair.  She is awake, alert, interactive, with quick verbal responses.  It seems that she has a mild head and upper extremity tremor.  She laughs frequently, which sounds like a harsh smokers  laugh.  Breathing is easy.  Breath sounds are distant, no wheeze or rhonchi or  rales today.  Heart auscultates regular, distant S1-S2, no audible murmur.  Beneath left breast is a patch of erythematous maceration with a few small surrounding pink papules.  Skin under the right breast is normal.  There is a seborrheic keratosis under the left breast which is not irritated.  Left abdomen noted for a small egg size bulge, very soft and nontender, exacerbated by Valsalva in size.  This is located in left mid lateral abdomen, just lateral to the.  Feet are dusky in dependent position.  There is no edema.  No foot lesions, nails were recently trimmed a podiatrist.  Dorsalis pedis pulses are barely palpable. Health Maintenance Due  Topic Date Due  . COVID-19 Vaccine (1) Never done  . HEMOGLOBIN A1C  07/05/2019    There are no preventive care reminders to display for this patient.  Lab Results  Component Value Date   TSH 0.860 11/13/2017   Lab Results  Component Value Date   WBC 6.7 05/23/2019   HGB 11.6 (L) 05/23/2019   HCT 37.0 05/23/2019   MCV 92.3 05/23/2019   PLT 196 05/23/2019   Lab Results  Component Value Date   NA 135 05/23/2019   K 4.0 05/23/2019   CO2 26 05/23/2019   GLUCOSE 225 (H) 05/23/2019   BUN 15 05/23/2019   CREATININE 0.83 05/23/2019   BILITOT 0.4 03/26/2017   ALKPHOS 62 03/26/2017   AST 18 03/26/2017   ALT 16 03/26/2017   PROT 6.5 03/26/2017   ALBUMIN 3.4 (L) 03/26/2017   CALCIUM 9.1 05/23/2019   ANIONGAP 10 05/23/2019   GFR 49.06 (L) 12/29/2012   Lab Results  Component Value Date   CHOL 115 11/05/2018   Lab Results  Component Value Date   HDL 38 (L) 11/05/2018   Lab Results  Component Value Date   LDLCALC 48 11/05/2018   Lab Results  Component Value Date   TRIG 146 11/05/2018   Lab Results  Component Value Date   CHOLHDL 3.0 11/05/2018   Lab Results  Component Value Date   HGBA1C 5.4 04/07/2019      Assessment & Plan:  See problem based charting for individual conditions:  Problem List Items Addressed This Visit        Respiratory   Nocturnal hypoxia per sleep study 07/2019 (Chronic)     Endocrine   Type 2 diabetes mellitus with diabetic neuropathy (HCC) (Chronic)     Musculoskeletal and Integument   Osteoporosis (Chronic)     Other   Moderate major depression (HCC) (Chronic)   History of mitral valve replacement with bioprosthetic valve   Chronic iron deficiency anemia    Follow-up: No follow-ups on file.   One month: continue conversations about fecal incontinence (and KUB results), tapering of evening duloxetine and PPI, sleep disturbance symptoms, insulin use, choice of anticoagulant, CPAP vs nocturnal 02, amyloidosis w/u.   Angelica Pou, MD

## 2019-08-31 NOTE — Assessment & Plan Note (Addendum)
Compensated. Recent visit with cardiologist Dr. Benjamine Mola.  BMP today.   Cardiology follow-up is next month.  Note there is a work-up underway for cardiac amyloidosis.  [name] reports that because the recent test was inconclusive, it has been recommended to be repeated in 67m

## 2019-09-01 ENCOUNTER — Encounter: Payer: Self-pay | Admitting: Internal Medicine

## 2019-09-01 ENCOUNTER — Other Ambulatory Visit: Payer: Self-pay

## 2019-09-01 ENCOUNTER — Telehealth: Payer: Self-pay

## 2019-09-01 ENCOUNTER — Ambulatory Visit: Payer: HMO | Admitting: Internal Medicine

## 2019-09-01 VITALS — BP 148/44 | HR 68 | Temp 98.4°F | Wt 169.3 lb

## 2019-09-01 DIAGNOSIS — K219 Gastro-esophageal reflux disease without esophagitis: Secondary | ICD-10-CM

## 2019-09-01 DIAGNOSIS — I739 Peripheral vascular disease, unspecified: Secondary | ICD-10-CM

## 2019-09-01 DIAGNOSIS — G4734 Idiopathic sleep related nonobstructive alveolar hypoventilation: Secondary | ICD-10-CM

## 2019-09-01 DIAGNOSIS — M79674 Pain in right toe(s): Secondary | ICD-10-CM

## 2019-09-01 DIAGNOSIS — M19041 Primary osteoarthritis, right hand: Secondary | ICD-10-CM

## 2019-09-01 DIAGNOSIS — I10 Essential (primary) hypertension: Secondary | ICD-10-CM

## 2019-09-01 DIAGNOSIS — M797 Fibromyalgia: Secondary | ICD-10-CM

## 2019-09-01 DIAGNOSIS — Z0000 Encounter for general adult medical examination without abnormal findings: Secondary | ICD-10-CM

## 2019-09-01 DIAGNOSIS — B351 Tinea unguium: Secondary | ICD-10-CM

## 2019-09-01 DIAGNOSIS — N3946 Mixed incontinence: Secondary | ICD-10-CM

## 2019-09-01 DIAGNOSIS — D509 Iron deficiency anemia, unspecified: Secondary | ICD-10-CM

## 2019-09-01 DIAGNOSIS — R159 Full incontinence of feces: Secondary | ICD-10-CM

## 2019-09-01 DIAGNOSIS — J439 Emphysema, unspecified: Secondary | ICD-10-CM

## 2019-09-01 DIAGNOSIS — M810 Age-related osteoporosis without current pathological fracture: Secondary | ICD-10-CM

## 2019-09-01 DIAGNOSIS — F321 Major depressive disorder, single episode, moderate: Secondary | ICD-10-CM

## 2019-09-01 DIAGNOSIS — Z860100 Personal history of colon polyps, unspecified: Secondary | ICD-10-CM

## 2019-09-01 DIAGNOSIS — Z794 Long term (current) use of insulin: Secondary | ICD-10-CM

## 2019-09-01 DIAGNOSIS — G4733 Obstructive sleep apnea (adult) (pediatric): Secondary | ICD-10-CM

## 2019-09-01 DIAGNOSIS — I2723 Pulmonary hypertension due to lung diseases and hypoxia: Secondary | ICD-10-CM

## 2019-09-01 DIAGNOSIS — B372 Candidiasis of skin and nail: Secondary | ICD-10-CM

## 2019-09-01 DIAGNOSIS — N904 Leukoplakia of vulva: Secondary | ICD-10-CM

## 2019-09-01 DIAGNOSIS — D369 Benign neoplasm, unspecified site: Secondary | ICD-10-CM

## 2019-09-01 DIAGNOSIS — E1140 Type 2 diabetes mellitus with diabetic neuropathy, unspecified: Secondary | ICD-10-CM

## 2019-09-01 DIAGNOSIS — R413 Other amnesia: Secondary | ICD-10-CM

## 2019-09-01 DIAGNOSIS — J449 Chronic obstructive pulmonary disease, unspecified: Secondary | ICD-10-CM

## 2019-09-01 DIAGNOSIS — I5032 Chronic diastolic (congestive) heart failure: Secondary | ICD-10-CM

## 2019-09-01 DIAGNOSIS — I480 Paroxysmal atrial fibrillation: Secondary | ICD-10-CM

## 2019-09-01 DIAGNOSIS — G479 Sleep disorder, unspecified: Secondary | ICD-10-CM

## 2019-09-01 DIAGNOSIS — Z7901 Long term (current) use of anticoagulants: Secondary | ICD-10-CM

## 2019-09-01 DIAGNOSIS — Z953 Presence of xenogenic heart valve: Secondary | ICD-10-CM

## 2019-09-01 DIAGNOSIS — M19042 Primary osteoarthritis, left hand: Secondary | ICD-10-CM

## 2019-09-01 DIAGNOSIS — E785 Hyperlipidemia, unspecified: Secondary | ICD-10-CM

## 2019-09-01 DIAGNOSIS — K439 Ventral hernia without obstruction or gangrene: Secondary | ICD-10-CM

## 2019-09-01 DIAGNOSIS — M79675 Pain in left toe(s): Secondary | ICD-10-CM

## 2019-09-01 DIAGNOSIS — K449 Diaphragmatic hernia without obstruction or gangrene: Secondary | ICD-10-CM

## 2019-09-01 LAB — GLUCOSE, CAPILLARY: Glucose-Capillary: 204 mg/dL — ABNORMAL HIGH (ref 70–99)

## 2019-09-01 LAB — POCT GLYCOSYLATED HEMOGLOBIN (HGB A1C): Hemoglobin A1C: 8.3 % — AB (ref 4.0–5.6)

## 2019-09-01 MED ORDER — NYSTATIN 100000 UNIT/GM EX POWD: 1.0000 "application " | Freq: Three times a day (TID) | CUTANEOUS | 0 refills | Status: DC

## 2019-09-01 MED ORDER — OMEPRAZOLE 40 MG PO CPDR: 40.0000 mg | DELAYED_RELEASE_CAPSULE | Freq: Every day | ORAL | 1 refills | Status: DC

## 2019-09-01 MED ORDER — FLUTICASONE-SALMETEROL 500-50 MCG/DOSE IN AEPB: INHALATION_SPRAY | RESPIRATORY_TRACT | 3 refills | Status: DC

## 2019-09-01 MED ORDER — POTASSIUM CHLORIDE CRYS ER 20 MEQ PO TBCR: EXTENDED_RELEASE_TABLET | ORAL | 3 refills | Status: DC

## 2019-09-01 MED ORDER — DULOXETINE HCL 60 MG PO CPEP: 60.0000 mg | ORAL_CAPSULE | Freq: Every day | ORAL | 3 refills | Status: DC

## 2019-09-01 MED ORDER — HYDROCODONE-ACETAMINOPHEN 5-325 MG PO TABS: 2.0000 | ORAL_TABLET | Freq: Four times a day (QID) | ORAL | 0 refills | Status: DC | PRN

## 2019-09-01 NOTE — Assessment & Plan Note (Signed)
Patient has been on 40 mg twice daily of PPI for a very long time, as [name] was never told that it should be gradually decreased.  [name] infrequently has GERD symptoms, and self treats with ranitidine which usually provides relief.  We discussed the contribution of long-term PPIs to osteoporosis, and given that [name] already has this diagnosis and her GERD is well controlled, [name] agrees to a taper.  Some evidence suggests that discontinuing high-dose chronic PPI suddenly can cause rebound GERD.  We will transition to an H2 B.

## 2019-09-01 NOTE — Assessment & Plan Note (Signed)
Left inframammary intertrigo.  This is a frequent occurrence, as is inguinal area.  She typically uses nystatin cream but has never tried the powder.  Nystatin powder to the affected area, continue to wear a bra with a soft cotton band and allow the area to dry as frequently as possible.  The hot humid weather is not helping.

## 2019-09-01 NOTE — Telephone Encounter (Signed)
Please call pt back regarding pain med.

## 2019-09-01 NOTE — Assessment & Plan Note (Signed)
Currently well controlled with inhaled pulmonary medication.

## 2019-09-01 NOTE — Assessment & Plan Note (Signed)
Has worn pull ups for many years. Otherwise not discussed in detail today.

## 2019-09-01 NOTE — Assessment & Plan Note (Signed)
Most painful areas are the thumb ICPs.  She is able to do her ADLs.  There is no acute inflammation, although these areas are tender to palpation.  She is not someone who enjoys applying topical lotions, and has not used her topical NSAID frequently, although it does help when she does use it.  There is Tylenol in her daily opioid.

## 2019-09-01 NOTE — Assessment & Plan Note (Signed)
Lost to GI follow-up due to unpaid bill.  It has been 8 years since her most recent colonoscopy.  She will be referred to a different practice.  She is insured.

## 2019-09-01 NOTE — Assessment & Plan Note (Signed)
Pt describes thrashing in bed, getting tangled in bed covers, intentionally sleeping cross wise on her bed so that she doesn't have as high of risk of rolling out, and using a body pillow for the same reason.  Can fall asleep but awakens every couple of hours (no nocturia, has pain but she doesn't think this is what awakens her).  Query REM sleep disorder? Will taper off nighttime dose of duloxetine as it can cause insomnia. She is agreeable.

## 2019-09-01 NOTE — Assessment & Plan Note (Signed)
Sleep study did not document apneic events. She wants to continue to wear her CPAP, as she does not believe that OSA can simply resolve. She states that she needs the CPAP to help her feel better. When asked details, she states that she has difficulty sleeping through the night, often awakening every 2 hours (not related to nocturia), has a tendency to move about in the bed, often thrashes her bed covers, and feels that the CPAP is a treatment for this. It may be that she does have an REM sleep disorder, which we can treat separately. I will continue to work with her on this. I cannot force her to discontinue her CPAP, but it is important that she continue to at least wear her oxygen nocturnal.

## 2019-09-01 NOTE — Assessment & Plan Note (Signed)
2 months of unintended fecal incontinence when voiding and when passing flatus.  Requires use of absorptive pull-up.  Not diarrheal, no blood, no pain.  Most recent solid bm has been weeks ago.  Does not fell as though she is bloated or constipated, does not have sensation of incomplete emptying.  Plan KUB for possible retained fecal ball.  She has not been taking her Senna S "I haven't for a long time, I don't need it"

## 2019-09-01 NOTE — Assessment & Plan Note (Signed)
No JVD or lower extremity edema suggestive of uncompensated right heart failure. Monitor.

## 2019-09-01 NOTE — Assessment & Plan Note (Addendum)
This chronic condition is symptomatic when not using her medication prescribed by gynecologist  With whom she has not had f/u (neither name of which she can recall).  It was recommended by that gynecologist that she go to a vulva specialist at [facility], which she didn't want to pursue due to the inconvenience (she doesn't drive).

## 2019-09-01 NOTE — Assessment & Plan Note (Signed)
Indication is for history of splenic infarction due to embolism complicating a heart procedure in 2012-2013. Warfarin was begun and she was told she'd remain on it "for the rest of my life'.  She has been resistant to a DOAC because she hasn't understood that these new medications are indicated as alternatives.

## 2019-09-01 NOTE — Assessment & Plan Note (Addendum)
  Mammogram 11/2018, normal Vision evaluation 10/31/2018, no retinopathy Colonoscopy 11/2011 with polypectomy (lost to follow-up due to unpaid bill)  Flu vaccine 11/2018 Covid vaccination-never? Bone density study 2016 showing osteoporosis

## 2019-09-01 NOTE — Assessment & Plan Note (Signed)
Taking Cymbalta 60 mg twice daily.  This has both antidepressant and anxiolytic properties.  Unfortunately the nighttime doses are frequently associated with insomnia, which she is currently experiencing.  She agrees to decreasing and eventually discontinuing her nighttime dose.

## 2019-09-01 NOTE — Assessment & Plan Note (Signed)
Single agent metoprolol, lasix dual indication heart failure chronic.  Uncontrolled, no change until BMP reviewed.  She was also nervous today about meeting new PCP and BP wasn't rechecked before she left.

## 2019-09-01 NOTE — Assessment & Plan Note (Signed)
See nocturnal hypoxia per sleep study problem. On follow-up sleep study in June 2021, sleep apnea have resolved. She does not believe this, and continues to wear her CPAP.

## 2019-09-02 ENCOUNTER — Other Ambulatory Visit: Payer: Self-pay | Admitting: Internal Medicine

## 2019-09-02 ENCOUNTER — Telehealth: Payer: Self-pay | Admitting: Dietician

## 2019-09-02 LAB — ANEMIA PROFILE B
Basophils Absolute: 0.1 10*3/uL (ref 0.0–0.2)
Basos: 1 %
EOS (ABSOLUTE): 0.1 10*3/uL (ref 0.0–0.4)
Eos: 1 %
Ferritin: 8 ng/mL — ABNORMAL LOW (ref 15–150)
Folate: 8.6 ng/mL (ref >3.0)
Hematocrit: 31.5 % — ABNORMAL LOW (ref 34.0–46.6)
Hemoglobin: 10.1 g/dL — ABNORMAL LOW (ref 11.1–15.9)
Immature Grans (Abs): 0 10*3/uL (ref 0.0–0.1)
Immature Granulocytes: 0 %
Iron Saturation: 10 % — ABNORMAL LOW (ref 15–55)
Iron: 43 ug/dL (ref 27–139)
Lymphocytes Absolute: 2.1 10*3/uL (ref 0.7–3.1)
Lymphs: 22 %
MCH: 25.6 pg — ABNORMAL LOW (ref 26.6–33.0)
MCHC: 32.1 g/dL (ref 31.5–35.7)
MCV: 80 fL (ref 79–97)
Monocytes Absolute: 0.9 10*3/uL (ref 0.1–0.9)
Monocytes: 10 %
Neutrophils Absolute: 6.3 10*3/uL (ref 1.4–7.0)
Neutrophils: 66 %
Platelets: 246 10*3/uL (ref 150–450)
RBC: 3.95 x10E6/uL (ref 3.77–5.28)
RDW: 15.7 % — ABNORMAL HIGH (ref 11.7–15.4)
Retic Ct Pct: 2.3 % (ref 0.6–2.6)
Total Iron Binding Capacity: 417 ug/dL (ref 250–450)
UIBC: 374 ug/dL — ABNORMAL HIGH (ref 118–369)
Vitamin B-12: 427 pg/mL (ref 232–1245)
WBC: 9.5 10*3/uL (ref 3.4–10.8)

## 2019-09-02 LAB — BMP8+ANION GAP
Anion Gap: 16 mmol/L (ref 10.0–18.0)
BUN/Creatinine Ratio: 17 (ref 12–28)
BUN: 18 mg/dL (ref 8–27)
CO2: 26 mmol/L (ref 20–29)
Calcium: 9.7 mg/dL (ref 8.7–10.3)
Chloride: 94 mmol/L — ABNORMAL LOW (ref 96–106)
Creatinine, Ser: 1.05 mg/dL — ABNORMAL HIGH (ref 0.57–1.00)
GFR calc Af Amer: 62 mL/min/{1.73_m2} (ref >59)
GFR calc non Af Amer: 54 mL/min/{1.73_m2} — ABNORMAL LOW (ref >59)
Glucose: 159 mg/dL — ABNORMAL HIGH (ref 65–99)
Potassium: 4.5 mmol/L (ref 3.5–5.2)
Sodium: 136 mmol/L (ref 134–144)

## 2019-09-02 LAB — VITAMIN D 25 HYDROXY (VIT D DEFICIENCY, FRACTURES): Vit D, 25-Hydroxy: 6.1 ng/mL — ABNORMAL LOW (ref 30.0–100.0)

## 2019-09-02 LAB — TSH: TSH: 1.2 u[IU]/mL (ref 0.450–4.500)

## 2019-09-02 NOTE — Telephone Encounter (Signed)
Requested an earlier appointment to help her with her blood sugars. She is unhappy with her a1c being > 8%. We discussed a few dietary changes she could try and will follow up on Wednesday with a telehealth appointment.

## 2019-09-02 NOTE — Telephone Encounter (Signed)
THanks, Butch Penny.  Until I'm more facile with looking up medication change history, do you happen to know what the intervention was when her regimen changed in response to lows?

## 2019-09-02 NOTE — Telephone Encounter (Signed)
Yes, I do. She is using an App called Insulincal that I can show you. I assisted her in adjusting her target, insulin to carb ratio and correction factor to give her less insulin.

## 2019-09-05 ENCOUNTER — Telehealth: Payer: Self-pay | Admitting: Pharmacist

## 2019-09-05 ENCOUNTER — Ambulatory Visit (HOSPITAL_COMMUNITY)
Admission: RE | Admit: 2019-09-05 | Discharge: 2019-09-05 | Disposition: A | Discharge status: Home / Self Care | Payer: HMO | Source: Ambulatory Visit | Attending: Internal Medicine | Admitting: Internal Medicine

## 2019-09-05 ENCOUNTER — Other Ambulatory Visit: Payer: Self-pay

## 2019-09-05 DIAGNOSIS — I878 Other specified disorders of veins: Secondary | ICD-10-CM | POA: Diagnosis not present

## 2019-09-05 DIAGNOSIS — K5900 Constipation, unspecified: Secondary | ICD-10-CM | POA: Diagnosis not present

## 2019-09-05 DIAGNOSIS — N2889 Other specified disorders of kidney and ureter: Secondary | ICD-10-CM | POA: Diagnosis not present

## 2019-09-05 DIAGNOSIS — R159 Full incontinence of feces: Secondary | ICD-10-CM | POA: Diagnosis not present

## 2019-09-05 NOTE — Telephone Encounter (Signed)
Agree.  I spoke with Ms. Blough about considering a DOAC.  I'm hopeful that ongoing education will help her agree eventually.  She is hanging on to the instruction when coumadin was started that "I have to take coumadin the rest of my life' (therefore, DOAC would be in her mind contraindicated).  Also, she saw Eliquis advertised "by that golfer who took it and he died, I"m not going to take that". We discussed how new research and availability of DOACs have made life easier (and safer) for those requiring anticoagulation, and that the golfer did not die from the Eliquis.

## 2019-09-05 NOTE — Telephone Encounter (Signed)
Patient texted me results of PST FS INR = 1.3 (target 1.5 - 2.5). States ate salad yesterday. Will increase from 12.72m/wk to 13.725mwk. Repeat PST FS POC INR in 1 week.

## 2019-09-06 ENCOUNTER — Telehealth: Payer: Self-pay

## 2019-09-07 ENCOUNTER — Other Ambulatory Visit: Payer: Self-pay

## 2019-09-07 ENCOUNTER — Ambulatory Visit: Payer: HMO | Admitting: Dietician

## 2019-09-07 ENCOUNTER — Encounter: Payer: Self-pay | Admitting: Dietician

## 2019-09-07 NOTE — Patient Instructions (Signed)
Hi Massiah,   It was nice speaking with you today!  We talked about the following:  1- changing your insulin to carb ratio in your Insulin calc App from "8" to "7" 2- changing your correction factor " 1 units per "31" in stead of "32" mg/dl  I suggest we follow up by phone to be sure your blood sugars are headed in the direction you want. I have made this appointment for you for your convenience. Feel free to change it if needed.   Butch Penny [phone]

## 2019-09-07 NOTE — Progress Notes (Signed)
Diabetes Self Management Education and Support:   Start time: 0920 am  End time:  0940 am  total time: 20 minutes  Telephone visit due to COVID-19.  I connected with  [name] on 09/07/19 by a telephone and verified that I am speaking with the correct person using two identifiers.   I discussed the limitations of evaluation and management by telemedicine. The patient expressed understanding and agreed to proceed.  Her concerns- "A1C more than 8".  She states she is okay with it begin anywhere between 7-8%,  Novolog insulin Totals: Yesterday- 05-24-11-4=34 Monday 11-15-4-10-6=46 Sunday- 7-16-9=32 Saturday 12-20-11=33 Friday 3-12-14-10-13=42 Average= 37.4 Current amount of Novolog 37.4 with 3-4 injections per day. calculated CF 24; calculated ICR ~6  Victoza- on max amount- 1.8 mg excellent adherence. could try increased amount vs switch to Ozempic. Willing to switch to Ozempic to see if it will lower her basal.   2 readers uploaded CGM Results from download:  last 2 weeks  2 weeks prior  Average glucose:  178 mg/dl   175 mg/dL for 14 days  Glucose management indicator:  7.6 %   7.5 %  Time in range (70-180 mg/dL):  57 %   59 %   (Goal >70%)  Time High (181-250 mg/dL):  37 %   37 %   (Goal < 25%)  Time Very High (>250 mg/dL):   6%   4 %     (Goal < 5%)  Time Low (54-69 mg/dL):  0 %   0 %     (Goal <4%)  Time Very Low (<54 mg/dL):  0 %   0 %    (Goal <1%)  Coefficient of variation:  22.2 %   22.5 %   (Goal <36%)  Patterns:  mostly flat pattern between 160-2262m/dl  mostly flat pattern between 150-192mdl with slight dip between 3am and 12 noon   Low blood sugar symptoms- none  Hyperglycemia- Spikes 2-4 hours after meals   Food intake:  Ms ElGerkenays she is trying to eat lower carb and make healthful choices.   GlEmiyaas educated about how to change setting that were discussed and approved by her doctor , JuDorian Podin her insulin calc app: she [place] to the  [place] to 7 and her correction from 32 to 31, no change in the target of 120. We plan to review CGM reader and how to find time in taget range and what it means. She verbalized understanding. PLan follow up in 2 weeks for continued Diabetes Self Management Support.   DoDebera LatRD 09/07/2019 9:16 AM.

## 2019-09-09 ENCOUNTER — Encounter: Payer: Self-pay | Admitting: *Deleted

## 2019-09-09 ENCOUNTER — Telehealth: Payer: Self-pay | Admitting: Dietician

## 2019-09-09 NOTE — Telephone Encounter (Signed)
[name] called to discuss a blood sugar of 70 at 541 pm on her CGM yesterday after over correcting her blood sugar. She denies nay symptoms of low blood sugar. She checked her blood sugar later with CGM and meter  At 744 pm her blood sugar was 256 on Sensor and it was 319 on her meter. Reminded her not to correct blood sugar more than every 5 hours, that using the InPen could help her be safer, and that she may need basal insulin as she is starting with elevated fasting blood sugars before her meals.

## 2019-09-13 ENCOUNTER — Ambulatory Visit: Payer: HMO | Admitting: Dietician

## 2019-09-14 ENCOUNTER — Telehealth: Payer: Self-pay | Admitting: Licensed Clinical Social Worker

## 2019-09-14 ENCOUNTER — Ambulatory Visit: Payer: HMO | Admitting: Licensed Clinical Social Worker

## 2019-09-14 ENCOUNTER — Telehealth: Payer: Self-pay | Admitting: Internal Medicine

## 2019-09-14 NOTE — Telephone Encounter (Signed)
Patient was called back per her request. Patient needed to cancel her appointment for today due to being so tired. Patient will be rescheduled.

## 2019-09-14 NOTE — Telephone Encounter (Signed)
Pls contact [phone]

## 2019-09-19 ENCOUNTER — Telehealth: Payer: Self-pay

## 2019-09-19 NOTE — Telephone Encounter (Signed)
Pt was upset that she and her sister had a disagreement this weekend

## 2019-09-19 NOTE — Telephone Encounter (Signed)
rtc to pt

## 2019-09-19 NOTE — Telephone Encounter (Signed)
Please call pt back.

## 2019-09-21 ENCOUNTER — Encounter: Payer: Self-pay | Admitting: Dietician

## 2019-09-21 ENCOUNTER — Ambulatory Visit (INDEPENDENT_AMBULATORY_CARE_PROVIDER_SITE_OTHER): Payer: HMO | Admitting: Dietician

## 2019-09-21 ENCOUNTER — Telehealth: Payer: Self-pay | Admitting: Pharmacist

## 2019-09-21 ENCOUNTER — Other Ambulatory Visit: Payer: Self-pay | Admitting: *Deleted

## 2019-09-21 DIAGNOSIS — B372 Candidiasis of skin and nail: Secondary | ICD-10-CM

## 2019-09-21 DIAGNOSIS — Z713 Dietary counseling and surveillance: Secondary | ICD-10-CM | POA: Diagnosis not present

## 2019-09-21 DIAGNOSIS — Z794 Long term (current) use of insulin: Secondary | ICD-10-CM | POA: Diagnosis not present

## 2019-09-21 DIAGNOSIS — E1140 Type 2 diabetes mellitus with diabetic neuropathy, unspecified: Secondary | ICD-10-CM

## 2019-09-21 NOTE — Telephone Encounter (Signed)
Patient called me on my cell phone stating she had fallen (denies LOC) after "feet getting tangled." States fell on LEFT knee. No lacertation or abrasion. No swelling. She has iced her knee. Advised her to continue "RICE" (rest, ice, compression and elevation.) If swelling/fluid accumulation or "hematoma" (which was explained to the patient) she should call Triage Nurse and ask for an appointment.

## 2019-09-21 NOTE — Progress Notes (Signed)
Diabetes Self Management Education and Support:   Start time: 1019 am  End time:  1051 am  total time: 32 minutes  Telephone visit due to COVID-19.  I connected with  [name] on 09/21/19 by a telephone and verified that I am speaking with the correct person using two identifiers.   I discussed the limitations of evaluation and management by telemedicine. The patient expressed understanding and agreed to proceed.  Patient locaction: Home Provider locaction: office  Her concerns- "A1C more than 8".  She states she is okay with it begin anywhere between 7-8%, Shellby's blood sugar are significantly higher than the past 2 weeks for no know reason. She is taking the same medicines, eating the same foods.  She is dosing her Novolog according to her new insulin cal settings which she confirmed today. She  reports that  she has been striggling for the past month or so with skin rashes under breasts and now has something going on in the crease of her groin, "hurts like the devil". Using nystatin powder and lotion assuming it is a yeast infection.   Victoza- on max amount- 1.8 mg excellent adherence. Novolog 3-4 injections.  Checked settings in insulin calc- ICR 7, correction 31, target 120  CGM Results from download:  last 2 weeks  2 weeks prior  Average glucose:  198  178 mg/dl  Glucose management indicator:  8%  7.6 %  Time in range (70-180 mg/dL):  36%  57 %  Time High (181-250 mg/dL):  52%  37 %  Time Very High (>250 mg/dL):   12%  6%  Time Low (54-69 mg/dL):  0%  0 %  Time Very Low (<54 mg/dL):0%  0%  0 %  Coefficient of variation:  22.2%  22.2 %  Patterns:   mostly flat pattern between 160-220 mg/dl    Low blood sugar symptoms- none  Hyperglycemia- more often and more elevated Sleeps- but broken up over 24 hours, no change  Smoking: reports daily Food intake:  [name] says she is trying to eat lower carb and make healthful choices.   Plan:-  encouraged healthy  choices, changing Novolog pen today in case she has bad insulin; ask triage nurse for assist with her skin rash. Follow up in 4-6 weeks  Debera Lat, RD 09/21/2019 10:15 AM.

## 2019-09-21 NOTE — Telephone Encounter (Signed)
Pt is having problems, states she thought she had yeast under her breasts and now her groin. Nystatin powder did not help so now she thinks maybe she is having an flare of the lichen sclerosus.  She needs something to help, please advise, you may reach her at [phone]

## 2019-09-22 ENCOUNTER — Encounter (HOSPITAL_COMMUNITY): Payer: HMO

## 2019-09-22 MED ORDER — NYSTATIN 100000 UNIT/GM EX POWD: 1.0000 "application " | Freq: Three times a day (TID) | CUTANEOUS | 0 refills | Status: DC

## 2019-09-22 NOTE — Telephone Encounter (Signed)
Hi, pt states she does not need an appt, states it is being made more than it needed to be, shestates the first bottle of powder was just a small amt and she needed more

## 2019-09-22 NOTE — Telephone Encounter (Signed)
Thank you for the mssg.  Would it be possible for her to come to the clinic so that we know what we're treating? Antifungal powder should help with candida, so it's unusual that she's getting worse.

## 2019-09-22 NOTE — Telephone Encounter (Signed)
Thank you, good advice.

## 2019-09-26 ENCOUNTER — Ambulatory Visit (HOSPITAL_COMMUNITY)
Admission: RE | Admit: 2019-09-26 | Discharge: 2019-09-26 | Disposition: A | Discharge status: Home / Self Care | Payer: HMO | Source: Ambulatory Visit | Attending: Adult Health | Admitting: Adult Health

## 2019-09-26 ENCOUNTER — Telehealth: Payer: Self-pay

## 2019-09-26 ENCOUNTER — Other Ambulatory Visit: Payer: Self-pay

## 2019-09-26 ENCOUNTER — Encounter (HOSPITAL_COMMUNITY): Payer: Self-pay

## 2019-09-26 ENCOUNTER — Telehealth: Payer: Self-pay | Admitting: Pharmacist

## 2019-09-26 VITALS — BP 118/64 | HR 73 | Wt 167.0 lb

## 2019-09-26 DIAGNOSIS — I2510 Atherosclerotic heart disease of native coronary artery without angina pectoris: Secondary | ICD-10-CM | POA: Insufficient documentation

## 2019-09-26 DIAGNOSIS — Z720 Tobacco use: Secondary | ICD-10-CM

## 2019-09-26 DIAGNOSIS — F17210 Nicotine dependence, cigarettes, uncomplicated: Secondary | ICD-10-CM | POA: Diagnosis not present

## 2019-09-26 DIAGNOSIS — Z7951 Long term (current) use of inhaled steroids: Secondary | ICD-10-CM | POA: Insufficient documentation

## 2019-09-26 DIAGNOSIS — F419 Anxiety disorder, unspecified: Secondary | ICD-10-CM | POA: Diagnosis not present

## 2019-09-26 DIAGNOSIS — G4733 Obstructive sleep apnea (adult) (pediatric): Secondary | ICD-10-CM | POA: Diagnosis not present

## 2019-09-26 DIAGNOSIS — Z8249 Family history of ischemic heart disease and other diseases of the circulatory system: Secondary | ICD-10-CM | POA: Diagnosis not present

## 2019-09-26 DIAGNOSIS — I739 Peripheral vascular disease, unspecified: Secondary | ICD-10-CM | POA: Diagnosis not present

## 2019-09-26 DIAGNOSIS — Z794 Long term (current) use of insulin: Secondary | ICD-10-CM | POA: Insufficient documentation

## 2019-09-26 DIAGNOSIS — Z9989 Dependence on other enabling machines and devices: Secondary | ICD-10-CM | POA: Diagnosis not present

## 2019-09-26 DIAGNOSIS — Z7901 Long term (current) use of anticoagulants: Secondary | ICD-10-CM | POA: Insufficient documentation

## 2019-09-26 DIAGNOSIS — I272 Pulmonary hypertension, unspecified: Secondary | ICD-10-CM | POA: Insufficient documentation

## 2019-09-26 DIAGNOSIS — I2723 Pulmonary hypertension due to lung diseases and hypoxia: Secondary | ICD-10-CM

## 2019-09-26 DIAGNOSIS — Z953 Presence of xenogenic heart valve: Secondary | ICD-10-CM | POA: Diagnosis not present

## 2019-09-26 DIAGNOSIS — I440 Atrioventricular block, first degree: Secondary | ICD-10-CM | POA: Insufficient documentation

## 2019-09-26 DIAGNOSIS — Z79899 Other long term (current) drug therapy: Secondary | ICD-10-CM | POA: Insufficient documentation

## 2019-09-26 DIAGNOSIS — I050 Rheumatic mitral stenosis: Secondary | ICD-10-CM | POA: Diagnosis not present

## 2019-09-26 DIAGNOSIS — E1140 Type 2 diabetes mellitus with diabetic neuropathy, unspecified: Secondary | ICD-10-CM | POA: Diagnosis not present

## 2019-09-26 DIAGNOSIS — M545 Low back pain, unspecified: Secondary | ICD-10-CM

## 2019-09-26 DIAGNOSIS — Z9981 Dependence on supplemental oxygen: Secondary | ICD-10-CM | POA: Insufficient documentation

## 2019-09-26 DIAGNOSIS — E785 Hyperlipidemia, unspecified: Secondary | ICD-10-CM | POA: Diagnosis not present

## 2019-09-26 DIAGNOSIS — J449 Chronic obstructive pulmonary disease, unspecified: Secondary | ICD-10-CM | POA: Diagnosis not present

## 2019-09-26 DIAGNOSIS — I480 Paroxysmal atrial fibrillation: Secondary | ICD-10-CM | POA: Insufficient documentation

## 2019-09-26 DIAGNOSIS — I5032 Chronic diastolic (congestive) heart failure: Secondary | ICD-10-CM

## 2019-09-26 DIAGNOSIS — G8929 Other chronic pain: Secondary | ICD-10-CM

## 2019-09-26 LAB — LIPID PANEL
Cholesterol: 122 mg/dL (ref 0–200)
HDL: 35 mg/dL — ABNORMAL LOW (ref >40)
LDL Cholesterol: 46 mg/dL (ref 0–99)
Total CHOL/HDL Ratio: 3.5 RATIO
Triglycerides: 204 mg/dL — ABNORMAL HIGH (ref <150)
VLDL: 41 mg/dL — ABNORMAL HIGH (ref 0–40)

## 2019-09-26 LAB — COMPREHENSIVE METABOLIC PANEL
ALT: 12 U/L (ref 0–44)
AST: 15 U/L (ref 15–41)
Albumin: 3.7 g/dL (ref 3.5–5.0)
Alkaline Phosphatase: 60 U/L (ref 38–126)
Anion gap: 12 (ref 5–15)
BUN: 16 mg/dL (ref 8–23)
CO2: 25 mmol/L (ref 22–32)
Calcium: 9.1 mg/dL (ref 8.9–10.3)
Chloride: 98 mmol/L (ref 98–111)
Creatinine, Ser: 1.11 mg/dL — ABNORMAL HIGH (ref 0.44–1.00)
GFR calc Af Amer: 58 mL/min — ABNORMAL LOW (ref >60)
GFR calc non Af Amer: 50 mL/min — ABNORMAL LOW (ref >60)
Glucose, Bld: 276 mg/dL — ABNORMAL HIGH (ref 70–99)
Potassium: 4.2 mmol/L (ref 3.5–5.1)
Sodium: 135 mmol/L (ref 135–145)
Total Bilirubin: 0.6 mg/dL (ref 0.3–1.2)
Total Protein: 6.8 g/dL (ref 6.5–8.1)

## 2019-09-26 LAB — CBC
HCT: 34 % — ABNORMAL LOW (ref 36.0–46.0)
Hemoglobin: 10.1 g/dL — ABNORMAL LOW (ref 12.0–15.0)
MCH: 24.4 pg — ABNORMAL LOW (ref 26.0–34.0)
MCHC: 29.7 g/dL — ABNORMAL LOW (ref 30.0–36.0)
MCV: 82.1 fL (ref 80.0–100.0)
Platelets: 222 10*3/uL (ref 150–400)
RBC: 4.14 MIL/uL (ref 3.87–5.11)
RDW: 16.5 % — ABNORMAL HIGH (ref 11.5–15.5)
WBC: 9.1 10*3/uL (ref 4.0–10.5)
nRBC: 0 % (ref 0.0–0.2)

## 2019-09-26 MED ORDER — HYDROCODONE-ACETAMINOPHEN 5-325 MG PO TABS: 2.0000 | ORAL_TABLET | Freq: Four times a day (QID) | ORAL | 0 refills | Status: DC | PRN

## 2019-09-26 NOTE — Patient Instructions (Signed)
Routine lab work today. Will notify you of abnormal results  Your provider requests you have a repeat PYP scan in October.  (we will call you to schedule your appointment)  Follow up with Dr.McLean in 3 months.

## 2019-09-26 NOTE — Progress Notes (Signed)
[facility] Progress Note    PCP: Angelica Pou, MD HF Cardiology: Dr. Aundra Dubin  69 y.o. with COPD, mitral stenosis s/p bioprosthetic MV replacement, paroxysmal atrial fibrillation, GI bleeding, and pulmonary hypertension/RV failure presents for cardiology followup.  She was admitted with GI bleeding, thought to be AVMs, in 2/18 and warfarin was stopped.  She was admitted in 3/18 with chest pain, dyspnea, and elevated troponin.  She had not been taking her Lasix regularly.  Coronary angiography showed nonobstructive CAD, suspect demand ischemia. RHC showed elevated right and left heart filling pressures and severe pulmonary hypertension.  V/Q scan showed no chronic PE, and PFTs showed severe obstructive lung disease.  Echo showed severe RV dilation with mildly decreased systolic function.  She was diuresed and eventually discharged to [facility].   She was admitted in 2/19 with anemia, got 2 units PRBCs.    Echo in 5/19 with EF 60-65%, mildly decreased RV systolic function, PASP 49 mmHg, normal bioprosthetic mitral valve.   She has chronic bilateral foot and lower leg pain, not exertional.  PAD noted on 3/21 ABIs.  She saw Dr. Gwenlyn Found and medical management was recommended.   At last clinic visit w/ Dr. Aundra Dubin 4/21, she was still limited by dyspnea from COPD + CHF. SOB walking short distances but no symptoms at rest. Was still smoking. Denied CP and no abnormal bleeding. Given diastolic HF and neuropathy, there was concern for potential amyloidosis. PYP scan was arranged which was equivocal (grade 2, H/CLL equal 1.3). Multiple Myeloma panel was negative. She was ordered to get a repeat PYP scan in 6 months (due 10/21).  She returns to clinic today for f/u. Here w/ her sister. Her wt is down 6 lb from previous visit, down from 173 lb in April to 167 lb today. She reports nearly 33 lb progressive wt loss over the past year, she attributes this to being on Victoza for DM. She is a  heavy smoker, since the age of 27. She smokes 1ppd. BP is well controlled on current regimen. EKG shows NSR w/ 1st degree AV block. She denies any symptoms of afib. Reports full compliance w/ coumadin. Denies melena/ hematochezia.     Labs (3/18): K 4.8 => 3.9, creatinine 1.2 => 1.09, hgb 10.2 => 10.7, BNP 131 Labs (5/18): K 4, creatinine 1.0 Labs (2/19): K 4.5, creatinine 0.94, transferrin saturation 3%.  Labs (3/19): hgb 11.3 Labs (4/19): K 4.1, creatinine 1.0, LDL 36 Labs (1/10): creatinine 0.85 Labs (7/20): hgb 10.7  Labs (10/20): K 4, creatinine 1.13, LDL 48 Labs (11/20): hgb 9.2 Labs (1/21): K 4.2, creatinine 1.28 Labs (2/21): hgb 10.7 Labs (4/21): hgb 11.6, creatinine 0.83, K 4.0, Multiple Myeloma Panel Negative   PMH: 1. Mitral stenosis: s/p bioprosthetic mitral valve replacement.  2. COPD: Severe by 3/18 PFTs.  She is still smoking.  3. Atrial fibrillation: Paroxysmal. Has been off warfarin since GI bleed in 2/18.  4. OSA: Uses CPAP.  5. Type II diabetes. 6. Pulmonary hypertension/RV failure: Suspect primarily group 3 PH due to underlying lung disease (COPD).  - RHC (3/18) with mean RA 14, PA 77/28 mean 47, mean PCWP 23, CI 2.6, PVR 4.9.  - Echo (3/18): EF 60-65%, severe RV dilation with mildly decreased RV systolic function, PASP 76 mmHg.  Bioprosthetic MV looks ok.  - V/Q scan (3/18): No evidence for chronic PE.  - PFTs (3/18): Severe obstructive airways disease.  - Echo in 5/19 with EF 60-65%, mildly decreased RV  systolic function, PASP 49 mmHg, normal bioprosthetic mitral valve.  7. Anemia: Fe deficiency and anemia of chronic disease.   - Suspect GI AVMs as cause of 2/18 bleed . Coumadin stopped.  8. CAD: LHC (3/18) with 30% LM, 40% proximal RCA.  9. Hyperlipidemia 10. PAD: ABIs 3/21 with 0.85 R, 1.03 L. 50-74% right SFA stenosis, 30-49% left SFA stenosis.  11. Carotid stenosis: Carotid dopplers (3/21) with 40-59% BICA.   SH: Lives alone in [place].  Active smoker.   No ETOH.   Family History  Problem Relation Age of Onset  . Peptic Ulcer Disease Father   . Heart attack Father 6       Died of MI at age 41  . Heart attack Brother 66       Died of MI at age 29  . Obesity Brother   . Pneumonia Mother   . Healthy Sister   . Lupus Daughter   . Obsessive Compulsive Disorder Daughter    ROS: All systems reviewed and negative except as per HPI.   Current Outpatient Medications  Medication Sig Dispense Refill  . albuterol (PROAIR HFA) 108 (90 Base) MCG/ACT inhaler Inhale 2 puffs into the lungs every 6 (six) hours as needed for shortness of breath. 18 g 5  . ALPRAZolam (XANAX) 1 MG tablet TAKE 1 TABLET BY MOUTH EVERY NIGHT AT BEDTIME AS NEEDED FOR INSOMNIA. MAY TAKE 1/2 TABLET BY MOUTH DURING THE DAY FOR ANXIETY 55 tablet 4  . benzonatate (TESSALON) 100 MG capsule Take 1 capsule (100 mg total) by mouth 3 (three) times daily as needed for cough. 90 capsule 11  . Blood Glucose Monitoring Suppl (ONETOUCH VERIO) W/DEVICE KIT 1 each by Does not apply route 4 (four) times daily. 1 kit 0  . buPROPion (WELLBUTRIN) 75 MG tablet Take 1 tablet (75 mg total) by mouth 2 (two) times daily. 180 tablet 3  . chlorpheniramine (CHLOR-TRIMETON) 4 MG tablet Take 4 mg by mouth daily as needed for allergies.    . clobetasol cream (TEMOVATE) 2.53 % Apply 1 application topically 2 (two) times daily as needed (lichen sclerosus flare). 45 g 3  . Continuous Blood Gluc Sensor (FREESTYLE LIBRE 2 SENSOR) MISC 1 each by Does not apply route 6 (six) times daily. To read 6 times daily with change of sensor every 2 weeks, 90 days worth with 3 refills 7 each 3  . DULoxetine (CYMBALTA) 60 MG capsule Take 1 capsule (60 mg total) by mouth daily. 180 capsule 3  . ferrous gluconate (FERGON) 324 MG tablet Take 1 tablet (324 mg total) by mouth 2 (two) times daily with a meal. (Patient taking differently: Take 648 mg by mouth See admin instructions. Take 2 tablets (648 mg) by mouth daily with lunch or  supper) 180 tablet 3  . fluticasone (FLONASE) 50 MCG/ACT nasal spray INSTILL 2 SPRAYS INTO BOTH NOSTRILS DAILY 54 g 3  . Fluticasone-Salmeterol (ADVAIR) 500-50 MCG/DOSE AEPB INHALE 1 PUFF INTO THE LUNGS TWICE DAILY 180 each 3  . furosemide (LASIX) 80 MG tablet TAKE 1 TABLET BY MOUTH EVERY MORNING AND TAKE 1/2 TABLET EVERY EVENING 45 tablet 11  . gabapentin (NEURONTIN) 300 MG capsule Take 2 capsules (600 mg total) by mouth 3 (three) times daily. 540 capsule 3  . glucose blood (ONETOUCH VERIO) test strip Use to check blood sugar 4 times daily. diag code E11.40. Insulin dependent 375 each 3  . HYDROcodone-acetaminophen (NORCO/VICODIN) 5-325 MG tablet Take 2 tablets by mouth every 6 (six) hours  as needed for severe pain. 114 tablet 0  . hydrocortisone cream 1 % APPLY EXTERNALLY TO THE AFFECTED AREA TWICE DAILY 28.35 g 2  . insulin aspart (NOVOLOG FLEXPEN) 100 UNIT/ML FlexPen INJECT 5 UNITS TO 20 UNITS SUBCUTANEOUSLY FOUR TIMES A DAY BEFORE MEALS AND AT BEDTIME 15 mL 5  . Insulin Pen Needle 32G X 4 MM MISC Use to inject insulin up to 6 times a day 500 each 3  . Insulin Syringe-Needle U-100 31G X 15/64" 0.5 ML MISC Use to inject insulin up to 4 times a day 400 each 3  . ipratropium-albuterol (DUONEB) 0.5-2.5 (3) MG/3ML SOLN INHALE THE CONTENTS OF 1 VIAL VIA NEBULIZER EVERY 6 HOURS AS NEEDED FOR SHORTNESS OF BREATH 360 mL 3  . Lancets Misc. (ACCU-CHEK FASTCLIX LANCET) KIT Check your blood 4 times a day dx code 250.00 insulin requiring 1 kit 2  . liraglutide (VICTOZA) 18 MG/3ML SOPN INJECT 1.8MG SUBCUTANEOUSLY DAILY 18 mL 3  . metoprolol succinate (TOPROL-XL) 50 MG 24 hr tablet Take 1 tablet (50 mg total) by mouth daily. Take with or immediately following a meal. 90 tablet 3  . nystatin (NYSTATIN) powder Apply 1 application topically 3 (three) times daily. 60 g 0  . omeprazole (PRILOSEC) 40 MG capsule Take 1 capsule (40 mg total) by mouth daily. 30 capsule 1  . ondansetron (ZOFRAN) 4 MG tablet TAKE 1 TABLET  BY MOUTH EVERY 8 HOURS AS NEEDED FOR NAUSEA OR VOMITING 20 tablet 2  . OXYGEN Inhale 2 L into the lungs at bedtime.    . potassium chloride SA (KLOR-CON) 20 MEQ tablet Take 2 tabs by mouth every morning and 1 tab every evening to replace potassium lost from lasix use. 270 tablet 3  . PRESCRIPTION MEDICATION Inhale into the lungs at bedtime. CPAP    . rosuvastatin (CRESTOR) 20 MG tablet TAKE 1 TABLET BY MOUTH ONCE EVERY NIGHT AT BEDTIME 90 tablet 3  . SPIRIVA RESPIMAT 2.5 MCG/ACT AERS INHALE 2 PUFFS BY MOUTH EVERY DAY 4 g 11  . warfarin (COUMADIN) 2.5 MG tablet Take 2.67m warfarin on We/Th/Sa/Su; 027mon Mondays; 1.2568m1/2 x 2.5mg69mblet) on Tu/Fri. 20 tablet 1   Current Facility-Administered Medications  Medication Dose Route Frequency Provider Last Rate Last Admin  . senna-docusate (Senokot-S) tablet 1 tablet  1 tablet Oral QHS PRN Hoffman, Jessica Ratliff, DO       BP 118/64   Pulse 73   Wt 75.8 kg (167 lb)   SpO2 97%   BMI 30.54 kg/m  PHYSICAL EXAM: General:  Well appearing WF, Kyphotic posture. No respiratory difficulty HEENT: normal Neck: supple. no JVD. Carotids 2+ bilat; no bruits. No lymphadenopathy or thyromegaly appreciated. Cor: PMI nondisplaced. Regular rate & rhythm. No rubs, gallops or murmurs. Lungs: decreased BS at the bases, no wheezing  Abdomen: soft, nontender, nondistended. No hepatosplenomegaly. No bruits or masses. Good bowel sounds. Extremities: no cyanosis, clubbing, rash, edema Neuro: alert & oriented x 3, cranial nerves grossly intact. moves all 4 extremities w/o difficulty. Affect pleasant.  Assessment/Plan: 1. Pulmonary hypertension: suspected to be primarily group 2 PH (pulmonary venous hypertension from LV diastolic dysfunction) and group 3 PH (severe COPD, OSA).  V/Q scan showed no evidence for chronic PE.  Selective pulmonary vasodilators not felt to be of benifit. The key here will continue to be good diuresis + use of oxygen and CPAP.  2. Chronic  diastolic CHF with prominent RV dysfunction: in the setting of severe COPD.  NYHA class III  symptoms, chronically (dyspnea is likely a combination of CHF and COPD).  She is euvolemic on exam, wt down 6 lb from previous clinic visit - Continue Lasix 80 qam/40 qpm.   - Check BMP today  - w/ chronic diastolic heart failure, conduction disorder (1st degree AVB + PAF) and neuropathy, there is concern for TTR amyloid. PYP Scan 4/21 equivocal. MMP negative. Plan repeat PYP scan 10/21.   3. Atrial fibrillation: Paroxysmal.  She is in NSR today by EKG.  She had been off anticoagulation due to GI bleeding, now back on warfarin with INR goal 1.5-2.5.  She had GI bleeding in 2/19, no recent melena.   - Keep warfarin with INR 1.5-2.5 for now. PCP follows INR - Check CBC today  - With most recent data regarding NOACs and bioprosthetic valves, Dr. Aundra Dubin suggested in the past that she switch to apixaban given GI bleeding history. She has wanted to stay on warfarin. We discussed this again today in detail and she elects to stay on warfarin   4. h/o GI bleeding: Possible GI AVM. Admitted with anemia again in 2/19, got 2 units PRBCs.  Denies any melena/ hematochezia - check CBC today  5. COPD: Severe by PFTs. Unfortunately, she is still smoking.  Encouraged smoking cessation  6. CAD: Nonobstructive by cath in 2018. Also w/ PAD. Denies CP. She is on a statin.  No ASA w/ warfarin. - check FLP and HFTs today. LDL goal < 70 mg/dL  7. PAD: PAD noted on 3/21 ABIs.  She saw Dr. Gwenlyn Found and medical management was recommended.  - encouraged to quit smoking - continue statin therap, check lipid panel today per above.  8. Tobacco Abuse: smoker since age 31, smokes 1ppd. Notes progressive 33 lb wt loss over the last year. She attributes this to being on Victoza. Had CT scan of chest in 2016 that was unremarkable  - will refer to lung cancer screening clinic. I discussed this with her today and is open to screening - smoking  cessation strongly encouraged    Plan repeat PYP scan in 2 months. F/u with Dr. Aundra Dubin thereafter, ~3 months   Kathryn Jester, PA-C  09/26/2019

## 2019-09-26 NOTE — Telephone Encounter (Signed)
Bonnita Nasuti would one of the attendings in clinic be willing to look into this for me? I'm on inpatient and swamped.  I don't know Ms. Labate enough to answer off the top of my head.  Can you "phone a friend" for me please?

## 2019-09-26 NOTE — Telephone Encounter (Signed)
Pt called to states pharm, [facility], does not have any more scripts on file for her pain med, called [facility], pharmacist states only 1 was rec'd in July instead of 3. Pt is requesting new script to be sent to pharm so when able to fill they can

## 2019-09-26 NOTE — Telephone Encounter (Signed)
Reviewed Database, and EMR medication has been chronic, no noted red flags. Sent 1 month refill

## 2019-09-26 NOTE — Telephone Encounter (Signed)
Patient texted results of PST FS POC INR = 1.8 (target established by previous PCP 1.5 - 2.5). Will continue same regimen: Su-2.36mM-0mgT-2.5mgW-2.5mgTh-2.5mgF-1.25mgSa-2.5mg. Repeat PST in one week. No bleeding symptoms/signs. Indication: AF.

## 2019-09-26 NOTE — Telephone Encounter (Signed)
Requesting to speak with a nurse, please call pt back.  

## 2019-09-27 ENCOUNTER — Ambulatory Visit (HOSPITAL_COMMUNITY)
Admission: RE | Admit: 2019-09-27 | Discharge: 2019-09-27 | Disposition: A | Discharge status: Home / Self Care | Payer: HMO | Source: Ambulatory Visit | Attending: Internal Medicine | Admitting: Internal Medicine

## 2019-09-27 ENCOUNTER — Telehealth: Payer: Self-pay | Admitting: Dietician

## 2019-09-27 ENCOUNTER — Other Ambulatory Visit: Payer: Self-pay

## 2019-09-27 ENCOUNTER — Telehealth: Payer: Self-pay | Admitting: Internal Medicine

## 2019-09-27 ENCOUNTER — Encounter: Payer: Self-pay | Admitting: Internal Medicine

## 2019-09-27 ENCOUNTER — Ambulatory Visit (INDEPENDENT_AMBULATORY_CARE_PROVIDER_SITE_OTHER): Payer: HMO | Admitting: Internal Medicine

## 2019-09-27 VITALS — BP 139/57 | HR 82 | Temp 98.0°F | Ht 62.0 in | Wt 167.0 lb

## 2019-09-27 DIAGNOSIS — R1013 Epigastric pain: Secondary | ICD-10-CM | POA: Insufficient documentation

## 2019-09-27 DIAGNOSIS — K449 Diaphragmatic hernia without obstruction or gangrene: Secondary | ICD-10-CM | POA: Insufficient documentation

## 2019-09-27 DIAGNOSIS — K219 Gastro-esophageal reflux disease without esophagitis: Secondary | ICD-10-CM | POA: Insufficient documentation

## 2019-09-27 LAB — TROPONIN I (HIGH SENSITIVITY): Troponin I (High Sensitivity): 5 ng/L (ref <18)

## 2019-09-27 LAB — LIPASE, BLOOD: Lipase: 26 U/L (ref 11–51)

## 2019-09-27 MED ORDER — OMEPRAZOLE 40 MG PO CPDR: 40.0000 mg | DELAYED_RELEASE_CAPSULE | Freq: Two times a day (BID) | ORAL | 1 refills | Status: DC

## 2019-09-27 NOTE — Patient Instructions (Signed)
[name],  It was a pleasure to see you today. Thank you for coming in.   Today we discussed your abdominal pain. Your EKG today looks reassuring. We are checking some labs to further evaluate and will contact you with the results. This could be related to your acid reflux, please increase your Prilosec to twice a day.   Please return to clinic in 1 month or sooner if needed.   Thank you again for coming in.   Asencion Noble.D.

## 2019-09-27 NOTE — Telephone Encounter (Signed)
Patient calls stating that she accidentally took 19 units Novolog instead of 9 units. She was advised to check her bood sugar every 15 minutes for the next 5 hours and consume carbohydrates appropriately. Estimated she would need ~ 70 additional grams of carbs in the next hour to compensate for the extra 10 units of Novolog insulin she took. She verbalized understanding of how to manage her blood sugars.

## 2019-09-27 NOTE — Progress Notes (Signed)
CC: Abdominal pain  HPI:  [name] is a 69 y.o. with the history listed below including HTN, PAD, HFrEF, PAF, COPD, GERD, DM, HLD, fibromyalgia presenting with abdominal pain.   Past Medical History:  Diagnosis Date  . Anxiety 07/24/2010  . Aortic atherosclerosis ([place]) 10/19/2014   Seen on CT scan, currently asymptomatic  . 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 Arthritis    "lower back; hands" (02/19/2018)  . Asymptomatic cholelithiasis 09/25/2015   Seen on CT scan 08/2015  . Carotid artery stenosis; s/p R endarterectomy    s/p right endarterectomy (06/2010) Carotid US (07/2010):  Left: Moderate-to-severe (60-79%) calcific and non-calcific plaque origin and proximal ICA and ECA   . Chronic congestive heart failure with left ventricular diastolic dysfunction ([place]) 10/21/2010  . Chronic constipation 02/03/2011  . Chronic daily headache 01/16/2014  . Chronic iron deficiency anemia   . Chronic low back pain 10/06/2012  . Chronic venous insufficiency 08/04/2012  . COPD (chronic obstructive pulmonary disease) with emphysema ([facility])    PFTs 2018: severe obstructive disease, insignif response to bronchiodilator, mild restriction parenchymal pattern, moderately severe diffusion defect. 2014  FEV1 0.92 (40%), ratio 69, 27% increase in FEV1 with BD, TLC 91%, severe airtrapping, DLCO49% On chronic home O2. Pulmonary rehab referral 05/2012   . Fibromyalgia 08/29/2010  . Gastroesophageal reflux disease   . History of blood transfusion    "several times"  (02/19/2018)  . History of clear cell renal cell carcinoma ([address]), in remission 07/21/2011   s/p cryoablation of left RCC in 09/2011 by [name]. Followed by [name]  [facility]) .    Marland Kitchen History of hiatal hernia   . History of mitral valve replacement with bioprosthetic valve due to mitral stenosis 2012   s/p MVR with a 27-mm  pericardial porcine valve (Medtronic Mosaic valve, serial #[id number] on 09/20/10, Dr. Prescott Gum)   . History of obstructive sleep apnea, resolved 2013   resolved per sleep study 07/2019; no apnea, but did have desaturation.  CPAP no longer necessary.  Nocturnal polysomnography (06/2009): Moderate sleep apnea/ hypopnea syndrome , AHI 17.8 per hour with nonpositional hypopneas. CPAP titration to 12 CWP, AHI 2.4 per hour. On nocturnal CPAP via a small resMed Quattro full-face mask with heated humidifier.   Marland Kitchen History of pneumonia    "once"  (02/19/2018)  . History of seborrheic keratosis 09/28/2015  . Hyperlipidemia LDL goal < 100 11/20/2005  . Internal and external hemorrhoids without complication 3/55/7322  . Lichen sclerosus of female genitalia 01/12/2017  . Migraine    "none in years" (02/19/2018)  . Moderately severe major depression ([place]) 11/19/2005  . Nocturnal hypoxia per sleep study 07/2019   . Osteoporosis    DEXA 2016: T -2.7; DEXA (12/09/2011): L-spine T -3.7, left hip T -1.4 DEXA (12/2004): L-spine T -2.6, left hip -0.1   . Paroxysmal atrial fibrillation ([place]) 10/22/2010   s/p Left atrial maze procedure for paroxysmal atrial fibrillation on 09/20/2010 by Dr Prescott Gum.  Subsequent splenic infarct, decision was made to re-anticoagulate with coumadin, likely life-long as this is the most likely cause of the splenic infarct.   . Personal history of colonic polyps 05/14/2011   Colonoscopy (05/2011): 4 mm adenomatous polyp excised endoscopically Colonoscopy (02/2002): Adenomatous polyp excised endoscopically   . Pulmonary hypertension due to chronic obstructive pulmonary disease ([place]) 04/25/2016   2014 TEE w PA peak pressure 46 mmHg, s/p MV replacement   .  Right nephrolithiasis, asymptomatic, incidental finding 09/06/2014   5 mm non-obstructing calculus seen on CT scan 09/05/2014   . Severe obesity (BMI 35.0-39.9) with comorbidity ([place]) 10/23/2011  . Tobacco abuse 07/28/2012  . Type 2 diabetes  mellitus with diabetic neuropathy ([place])    Review of Systems:   Constitutional: Negative for chills and fever.  Respiratory: Negative for shortness of breath.   Cardiovascular: Negative for chest pain and leg swelling.  Gastrointestinal: Positive for abdominal pain, nausea. Negative for vomiting.  Neurological: Negative for dizziness and headaches.   Physical Exam:  Vitals:   09/27/19 1535  BP: (!) 139/57  Pulse: 82  Temp: 98 F (36.7 C)  SpO2: 98%  Weight: 167 lb (75.8 kg)  Height: 5' 2" (1.575 m)   Physical Exam Constitutional:      Appearance: She is well-developed.  Cardiovascular:     Rate and Rhythm: Normal rate and regular rhythm.  Pulmonary:     Effort: Pulmonary effort is normal.     Breath sounds: Normal breath sounds.  Abdominal:     General: Abdomen is flat. Bowel sounds are normal.     Palpations: Abdomen is soft.     Tenderness: There is abdominal tenderness in the epigastric area. There is no guarding or rebound.  Skin:    General: Skin is warm and dry.     Capillary Refill: Capillary refill takes less than 2 seconds.  Neurological:     Mental Status: She is alert.  Psychiatric:        Mood and Affect: Mood normal.        Behavior: Behavior normal.     Assessment & Plan:   See Encounters Tab for problem based charting.  Patient discussed with Dr. Rebeca Alert

## 2019-09-27 NOTE — Telephone Encounter (Signed)
TELEPHONE ENCOUNTER  Pt requested call back via the emergency telehealth line.   HPI: 69yo chronically ill female with heart failure, PAF, hypertension, COPD, GERD, T2DM, depression, fibromyalgia. She reached out this morning to seek guidance on irretractable epigastric pain. She notes waking up with this. She went to her cardiology appointment at which time EKG was without significant ischemic findings. Pain was constant and she had her sister pick up a bonjangles sandwich which did not improve her symptoms. Endorses nausea but no vomiting or other GI sx. Denies shortness of breath or chest pain. She notes chronic PPI use. She additionally tried 2 doses of OTC pepcid however has continued to have discomfort despite this.   Assessment:  --likely GERD. Inquired to patient about trying TUMS or mylanta however she does not have these readily available.  --cardiac etiology also considered and can not be ruled out but is less likely given HPI and cardiology visit this morning.   Plan: relayed to patient that there are limitations to televisit which she acknowledged understanding to. Encouraged her to seek further evaluation in the ED as cardiac etiology can not be ruled out. She is worried about getting COVID and does not want to go to the ED. Explained that cardiac etiology can be equally or more dangerous however she is still resistant to seeking assessment there. She was more agreeable to the alternative of being seen in the [facility] tomorrow.  --will send message to our front desk to arrange an appointment in AM.

## 2019-09-28 DIAGNOSIS — R109 Unspecified abdominal pain: Secondary | ICD-10-CM | POA: Insufficient documentation

## 2019-09-28 NOTE — Progress Notes (Signed)
Internal Medicine Clinic Attending  Case discussed with Dr. Krienke at the time of the visit.  We reviewed the resident's history and exam and pertinent patient test results.  I agree with the assessment, diagnosis, and plan of care documented in the resident's note.  Hikeem Andersson, M.D., Ph.D.  

## 2019-09-28 NOTE — Assessment & Plan Note (Signed)
Patient reports that she has been having abdominal pain for the past 1 to 2 days.  She started having some indigestion yesterday morning and had taken her Prilosec, she went to her cardiologist office and had an EKG that was unremarkable.  She did then went to [place] and had a biscuit started having worsening abdominal discomfort and acid reflux.  He had called in this morning and was advised to take Mylanta and made an appointment to be seen later the afternoon.  She reports that the abdominal pain is in the middle of her stomach, it is constant, and radiates up to her throat, associated with some nausea and decreased appetite.  She denies any vomiting, diarrhea, fevers, chest pain, shortness of breath, headaches, lightheadedness, dizziness, or any other symptoms.  EKG showed sinus rhythm with first-degree AV block, heart rate in the 70s, no acute ST changes or T wave inversions.  Unchanged from prior EKG. Stat troponin was 5, lipase was normal at 26.  On chart review it appears that patient had previously been on omeprazole twice daily, she had previously been decreased to once a day and started on an additional H2 blocker, she continued to have symptoms despite this adjustment and was restarted on the twice daily dosing.  Her medications were adjusted about 1 month ago due to the risk of osteoporosis from the PPIs.  Cardiac etiology appears less likely since repeat EKG showed no acute changes and troponins and lipase were unremarkable.  CBC and BMP done yesterday showed no acute changes.  Given her symptoms she may require a higher dose of PPIs to treat her acid reflux.  Advised to increase her omeprazole back to twice daily dosing.  She does have a follow-up with her PCP in a few weeks.  -Increase omeprazole to 40 mg twice daily -Follow-up in 2-3 weeks

## 2019-09-29 ENCOUNTER — Encounter: Payer: HMO | Admitting: Internal Medicine

## 2019-10-03 ENCOUNTER — Telehealth: Payer: Self-pay | Admitting: Pharmacist

## 2019-10-03 NOTE — Telephone Encounter (Signed)
Patient texted results of PST POC FS INR = 2.2 on 13.16m warfarin/wk. Goal 1.5 - 2.5. Patient denies any symptoms or signs of bleeding. No new medications. Will leave on same regimen. Repeat INR in 1 week.

## 2019-10-04 NOTE — Telephone Encounter (Signed)
agree

## 2019-10-05 ENCOUNTER — Other Ambulatory Visit: Payer: Self-pay | Admitting: *Deleted

## 2019-10-05 DIAGNOSIS — F17210 Nicotine dependence, cigarettes, uncomplicated: Secondary | ICD-10-CM

## 2019-10-05 DIAGNOSIS — Z87891 Personal history of nicotine dependence: Secondary | ICD-10-CM

## 2019-10-05 NOTE — Progress Notes (Signed)
hest

## 2019-10-06 ENCOUNTER — Telehealth: Payer: Self-pay | Admitting: Internal Medicine

## 2019-10-06 NOTE — Telephone Encounter (Signed)
Returned call to patient. States her CGM alerted her twice last night that BS was low.   5:47 PM Took 22 units of Novolog    7 PM Took 1.8 mg Victoza   9:27 PM CGM was 66. Patient was asymptomatic. Ate 27 carbs came up to 75, ate 12 more carbs came up to 174. Before bed she was at 111. Ate 12 carbs plus chicken and cheese.  4:17 AM CGM was 63. Still asymptomatic. Ate 27 carbs came up to 150. She double checked with glucometer and it read 202.  Blood sugars this AM have been 158, 149, 159. States she feels OK.  Patient has appt with PCP on 10/13/2019. Wants to know if she should make any adjustments to novolog or Victoza before then. Hubbard Hartshorn, BSN, RN-BC

## 2019-10-06 NOTE — Telephone Encounter (Signed)
Pls contact pt regarding blood sugar [phone]

## 2019-10-10 ENCOUNTER — Encounter (HOSPITAL_COMMUNITY)
Admission: RE | Admit: 2019-10-10 | Discharge: 2019-10-10 | Disposition: A | Discharge status: Home / Self Care | Payer: HMO | Source: Ambulatory Visit | Attending: Cardiology | Admitting: Cardiology

## 2019-10-10 ENCOUNTER — Telehealth: Payer: Self-pay | Admitting: Dietician

## 2019-10-10 ENCOUNTER — Other Ambulatory Visit: Payer: Self-pay

## 2019-10-10 DIAGNOSIS — E859 Amyloidosis, unspecified: Secondary | ICD-10-CM | POA: Insufficient documentation

## 2019-10-10 DIAGNOSIS — I509 Heart failure, unspecified: Secondary | ICD-10-CM | POA: Diagnosis not present

## 2019-10-10 MED ORDER — TECHNETIUM TC 99M PYROPHOSPHATE
21.9000 | Freq: Once | INTRAVENOUS | Status: AC | PRN
  Administered 2019-10-10: 21.9 via INTRAVENOUS
  Filled 2019-10-10: qty 22

## 2019-10-10 NOTE — Telephone Encounter (Signed)
[name] reports an asymptomatic low blood sugar that she got a low alarm for at 151 AM last night. She ate carbs to treat it and it increased to 96 at 222 am , then 244 at 6 am. Consider adding basal insulin to liraglutide (xultophy) and patient may be abel to  discontinue Novolog. She has an appointment with her doctor on Thursday and has uploaded her Continuous glucose monitor.

## 2019-10-10 NOTE — Telephone Encounter (Signed)
Thank you, I"ll take a look on Thursday.  Id love for her to come off insulin altogether!

## 2019-10-13 ENCOUNTER — Other Ambulatory Visit: Payer: Self-pay

## 2019-10-13 ENCOUNTER — Ambulatory Visit (INDEPENDENT_AMBULATORY_CARE_PROVIDER_SITE_OTHER): Payer: HMO | Admitting: Internal Medicine

## 2019-10-13 ENCOUNTER — Encounter: Payer: HMO | Admitting: Internal Medicine

## 2019-10-13 VITALS — BP 124/38 | HR 74 | Temp 98.3°F | Ht 62.0 in | Wt 171.3 lb

## 2019-10-13 DIAGNOSIS — E785 Hyperlipidemia, unspecified: Secondary | ICD-10-CM

## 2019-10-13 DIAGNOSIS — I739 Peripheral vascular disease, unspecified: Secondary | ICD-10-CM

## 2019-10-13 DIAGNOSIS — G479 Sleep disorder, unspecified: Secondary | ICD-10-CM

## 2019-10-13 DIAGNOSIS — B372 Candidiasis of skin and nail: Secondary | ICD-10-CM

## 2019-10-13 DIAGNOSIS — D509 Iron deficiency anemia, unspecified: Secondary | ICD-10-CM | POA: Diagnosis not present

## 2019-10-13 DIAGNOSIS — K219 Gastro-esophageal reflux disease without esophagitis: Secondary | ICD-10-CM

## 2019-10-13 DIAGNOSIS — E859 Amyloidosis, unspecified: Secondary | ICD-10-CM | POA: Diagnosis not present

## 2019-10-13 DIAGNOSIS — R159 Full incontinence of feces: Secondary | ICD-10-CM | POA: Diagnosis not present

## 2019-10-13 DIAGNOSIS — G4734 Idiopathic sleep related nonobstructive alveolar hypoventilation: Secondary | ICD-10-CM

## 2019-10-13 DIAGNOSIS — L304 Erythema intertrigo: Secondary | ICD-10-CM | POA: Diagnosis not present

## 2019-10-13 DIAGNOSIS — I43 Cardiomyopathy in diseases classified elsewhere: Secondary | ICD-10-CM

## 2019-10-13 DIAGNOSIS — Z23 Encounter for immunization: Secondary | ICD-10-CM | POA: Diagnosis not present

## 2019-10-13 DIAGNOSIS — Z86010 Personal history of colonic polyps: Secondary | ICD-10-CM | POA: Diagnosis not present

## 2019-10-13 DIAGNOSIS — Z794 Long term (current) use of insulin: Secondary | ICD-10-CM

## 2019-10-13 DIAGNOSIS — F321 Major depressive disorder, single episode, moderate: Secondary | ICD-10-CM

## 2019-10-13 DIAGNOSIS — I10 Essential (primary) hypertension: Secondary | ICD-10-CM

## 2019-10-13 DIAGNOSIS — E1140 Type 2 diabetes mellitus with diabetic neuropathy, unspecified: Secondary | ICD-10-CM | POA: Diagnosis not present

## 2019-10-13 DIAGNOSIS — Z720 Tobacco use: Secondary | ICD-10-CM

## 2019-10-13 DIAGNOSIS — K449 Diaphragmatic hernia without obstruction or gangrene: Secondary | ICD-10-CM

## 2019-10-13 DIAGNOSIS — E854 Organ-limited amyloidosis: Secondary | ICD-10-CM

## 2019-10-13 DIAGNOSIS — M797 Fibromyalgia: Secondary | ICD-10-CM

## 2019-10-14 ENCOUNTER — Telehealth (HOSPITAL_COMMUNITY): Payer: Self-pay

## 2019-10-14 DIAGNOSIS — E859 Amyloidosis, unspecified: Secondary | ICD-10-CM

## 2019-10-14 DIAGNOSIS — I5032 Chronic diastolic (congestive) heart failure: Secondary | ICD-10-CM

## 2019-10-14 NOTE — Telephone Encounter (Signed)
-----  Message from [name], MD sent at 10/12/2019  4:20 PM EDT ----- Equivocal, grade II is suggestive of amyloidosis but H/CL ratio < 1.5 is not.  Would repeat study in 3-4 months.

## 2019-10-14 NOTE — Telephone Encounter (Signed)
Pt aware of test results. Verbalized understanding. Test ordered and patient understands she will get a call to schedule this test.

## 2019-10-18 ENCOUNTER — Telehealth: Payer: Self-pay | Admitting: Pharmacist

## 2019-10-18 NOTE — Telephone Encounter (Signed)
Patient texted results of PST FS POC INR results = 1.9 (target 1.5 - 2.5). Will continue same regiment and repeat PST INR on Monday, 13-SEP-21.

## 2019-10-18 NOTE — Telephone Encounter (Signed)
Received fax from [facility] with INR results from 05/30/2019 to 10/18/2019. Last INR as below. Results placed in Dr. Gladstone Pih office. Hubbard Hartshorn, BSN, RN-BC

## 2019-10-20 ENCOUNTER — Telehealth: Payer: Self-pay | Admitting: Dietician

## 2019-10-20 ENCOUNTER — Telehealth: Payer: Self-pay | Admitting: *Deleted

## 2019-10-20 NOTE — Telephone Encounter (Signed)
TC to patient.  She states she saw Dr. [name] on 10/13/19 and is not able to see the AVS in MyChart.  Also states MD spoke to her about Fe infusions and a RX for high dose Vit D X 4 weeks.  Pt states MD told her she was going to call her the day after her appt, but has not heard back, she is requesting a call from MD. Laurence Compton, RN,BSN

## 2019-10-20 NOTE — Telephone Encounter (Signed)
Patient requesting a call.  States she is having a problem.

## 2019-10-20 NOTE — Telephone Encounter (Signed)
Readings going up close to 300 mg/dl and the alarm is bothering her. She reset the alarm for 313m/dl while we were on the phone.

## 2019-10-24 ENCOUNTER — Telehealth: Payer: Self-pay | Admitting: Pharmacist

## 2019-10-24 DIAGNOSIS — Z7901 Long term (current) use of anticoagulants: Secondary | ICD-10-CM | POA: Diagnosis not present

## 2019-10-24 DIAGNOSIS — I480 Paroxysmal atrial fibrillation: Secondary | ICD-10-CM | POA: Diagnosis not present

## 2019-10-24 NOTE — Telephone Encounter (Signed)
Patient texted results of PST FS POC INR 1.8 (target goal 1.5 - 2.5) on 13.60m warfarin/wk using 2.574mstrength tablets: Su-2.61m80m0mgT-2.61mgW-2.61mgTh-2.61mgF-1.261mgSa-2.61mg Continue same regimen. Repeat PST FS POC INR determination 20SEP21. No bleeding complications cited by the patient.

## 2019-10-25 ENCOUNTER — Other Ambulatory Visit: Payer: Self-pay | Admitting: Internal Medicine

## 2019-10-25 DIAGNOSIS — J439 Emphysema, unspecified: Secondary | ICD-10-CM

## 2019-10-25 NOTE — Telephone Encounter (Signed)
Returned call to patient. Requesting refill on albuterol and ondansetron at [facility]. Confirmed upcoming appt on 11/17/2019 with PCP. Hubbard Hartshorn, BSN, RN-BC

## 2019-10-25 NOTE — Telephone Encounter (Signed)
Return pt's call; no answer - left message to call the office.

## 2019-10-25 NOTE — Telephone Encounter (Signed)
Pt is requesting a nurse to callback [phone]

## 2019-10-26 ENCOUNTER — Ambulatory Visit: Payer: HMO

## 2019-10-26 ENCOUNTER — Other Ambulatory Visit: Payer: Self-pay | Admitting: Student in an Organized Health Care Education/Training Program

## 2019-10-26 ENCOUNTER — Encounter: Payer: Self-pay | Admitting: Licensed Clinical Social Worker

## 2019-10-26 ENCOUNTER — Encounter: Payer: HMO | Admitting: Acute Care

## 2019-10-26 DIAGNOSIS — J439 Emphysema, unspecified: Secondary | ICD-10-CM

## 2019-10-27 ENCOUNTER — Other Ambulatory Visit: Payer: Self-pay | Admitting: Internal Medicine

## 2019-10-27 DIAGNOSIS — E559 Vitamin D deficiency, unspecified: Secondary | ICD-10-CM

## 2019-10-27 DIAGNOSIS — D509 Iron deficiency anemia, unspecified: Secondary | ICD-10-CM

## 2019-10-27 MED ORDER — VITAMIN D3 1.25 MG (50000 UT) PO TABS: ORAL_TABLET | ORAL | 0 refills | Status: DC

## 2019-10-27 NOTE — Telephone Encounter (Addendum)
Patient calling back about multiple concerns:  Refills on albuterol and ondansetron.   Requesting AVS from OV on 10/13/2019 be posted to [place].   States she spoke with PCP regarding iron infusions but has not received any calls regarding this.  Prilosec written 09/27/2019 was for 30 tabs but dose is 1 cap BID. Will need new Rx  Duloxetine 60 mg should be BID per discussion patient had with PCP but is listed as once daily.  Hubbard Hartshorn, BSN, RN-BC

## 2019-10-31 ENCOUNTER — Telehealth: Payer: Self-pay | Admitting: *Deleted

## 2019-10-31 ENCOUNTER — Telehealth: Payer: Self-pay | Admitting: Pharmacist

## 2019-10-31 NOTE — Telephone Encounter (Signed)
Patient texted me results of her PST FS POC INR determination = 1.8 (goal is 1.5 - 2.5 as established by her previous PCP based upon a bleeding history.) Will CONTINUE same regimen.Su-2.64mM-0mgT-2.5mgW-2.5mgTh-2.5mgF-1.25mgSa-2.5mg. INR 1wk. Denies any symptoms/signs of bleeding. Adequate supply of warfarin. No medication changes cited.

## 2019-10-31 NOTE — Telephone Encounter (Signed)
Received fax from [facility] with INR results from 06/13/2019 to 10/31/2019. Last INR 1.8 on 10/31/2019. Dr. Elie Confer is already aware and has addressed. Results placed in Dr. Gladstone Pih office. Hubbard Hartshorn, BSN, RN-BC

## 2019-11-01 IMAGING — MR MR ABDOMEN WO/W CM
9 of 18 series · 21 of 48 positions shown · IV contrast (multihance)
Comparison: CT on 10/07/2016

CLINICAL DATA: Followup indeterminate left renal lesion. Prior left
renal cell carcinoma status post cryoablation.

EXAM:
MRI ABDOMEN WITHOUT AND WITH CONTRAST
TECHNIQUE: Multiplanar multisequence MR imaging of the abdomen was performed
both before and after the administration of intravenous contrast.
CONTRAST:  18mL MULTIHANCE GADOBENATE DIMEGLUMINE 529 MG/ML IV SOLN

[Series 3: DWI b500 · axial · 6.0mm · 1.76mm/px · z∈[-171,+87]mm · 3 of 67 slices shown]
[im 1/67]
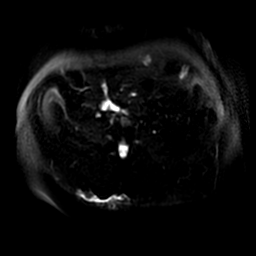
[im 34/67]
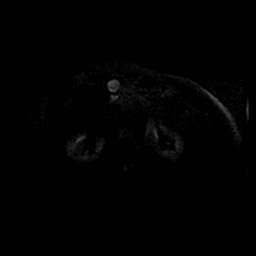
[im 67/67]
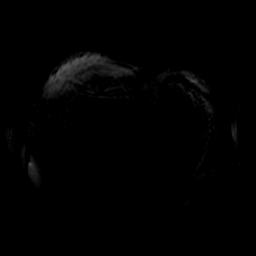

[Series 4: T2 fat-sat · axial · 5.0mm · 0.90mm/px · z∈[-161,+54]mm · 2 of 44 slices shown]
[im 1/44]
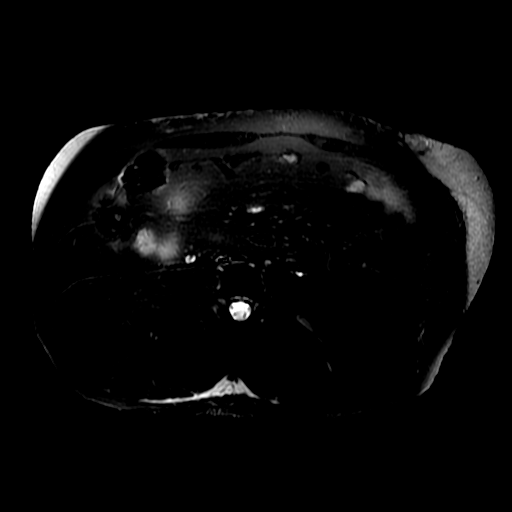
[im 44/44]
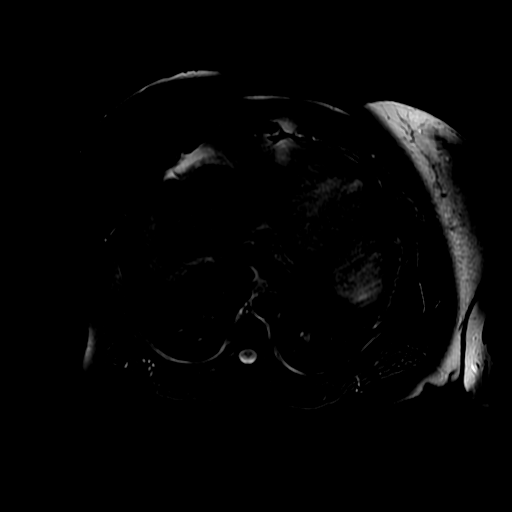

[Series 5: T2 · axial · 5.0mm · 0.90mm/px · z∈[-161,+54]mm · 2 of 44 slices shown (1 of 2)]
[im 1/44]
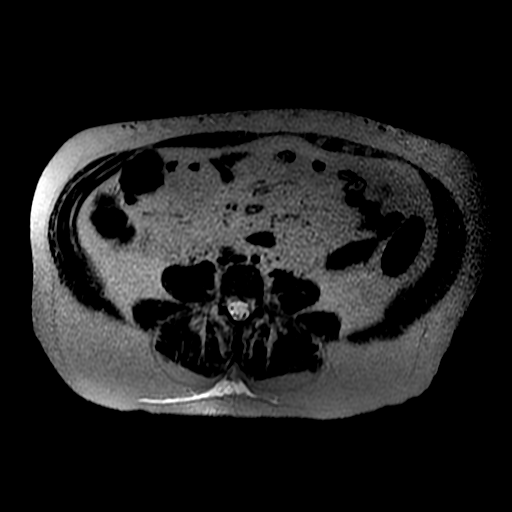
[im 44/44]
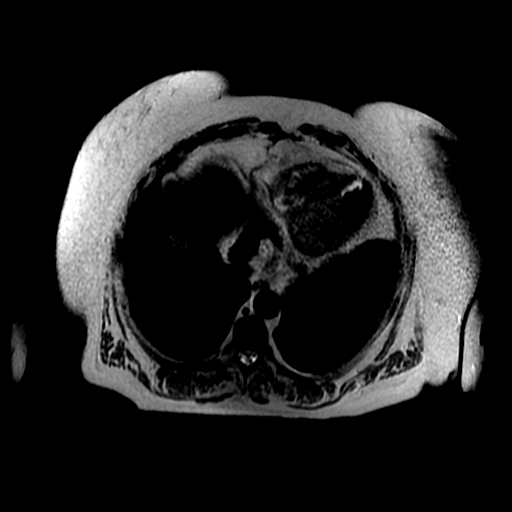

[Series 6: T2 · coronal · 5.0mm · 0.82mm/px · 2 of 51 slices shown (2 of 2)]
[im 1/51]
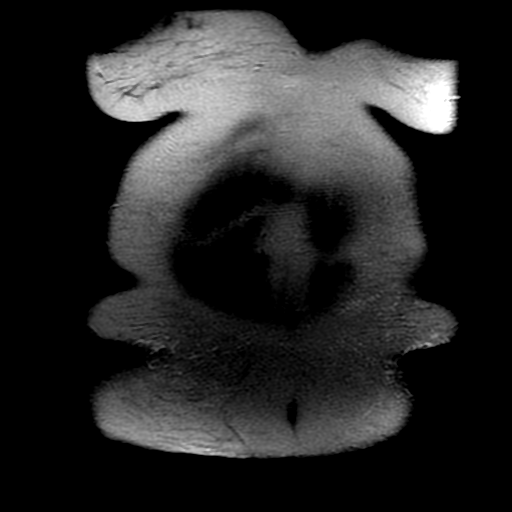
[im 51/51]
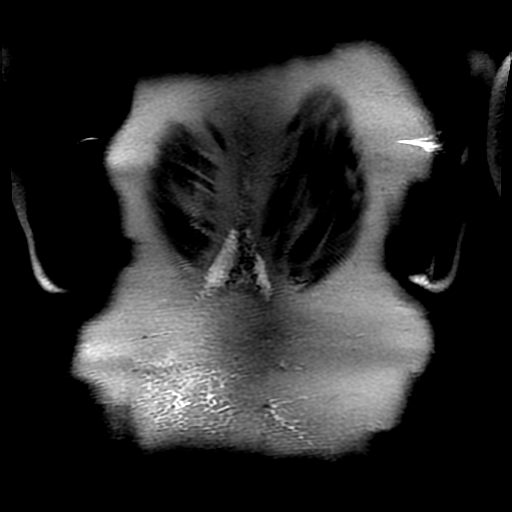

[Series 7: bSSFP · axial · 5.0mm · 0.90mm/px · z∈[-161,+54]mm · 2 of 44 slices shown]
[im 1/44]
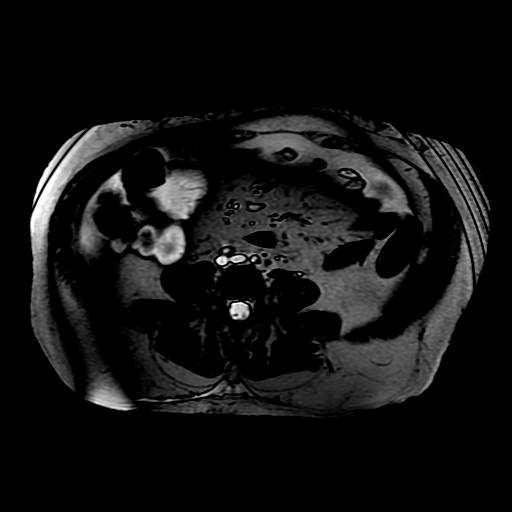
[im 44/44]
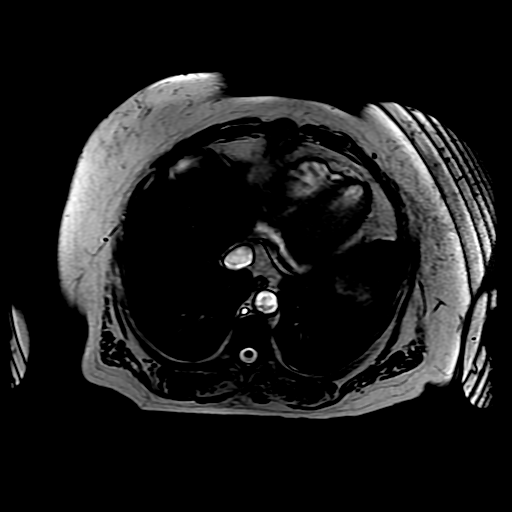

[Series 8: ax dualecho · axial · 5.0mm · 0.90mm/px · z∈[-161,+54]mm · 3 of 88 slices shown]
[im 1/88]
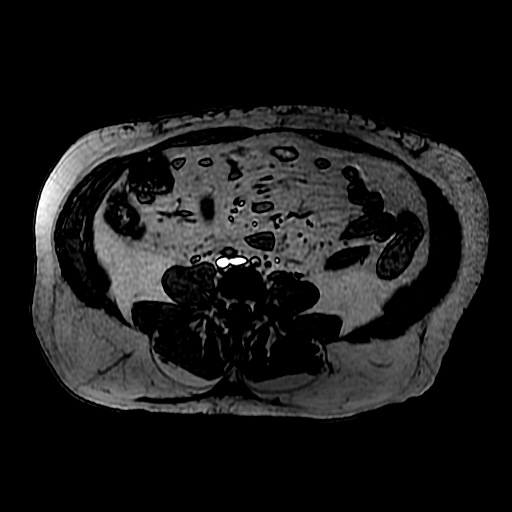
[im 44/88]
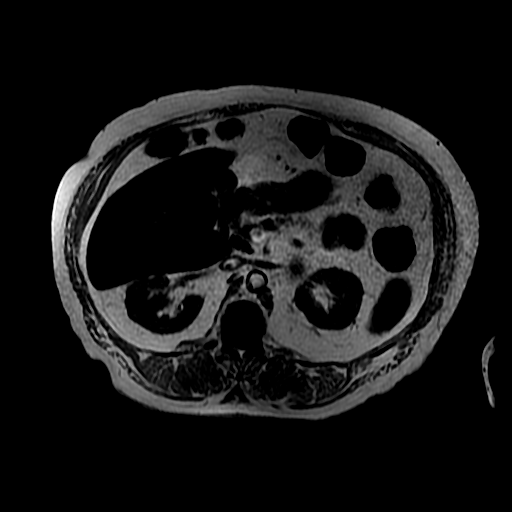
[im 88/88]
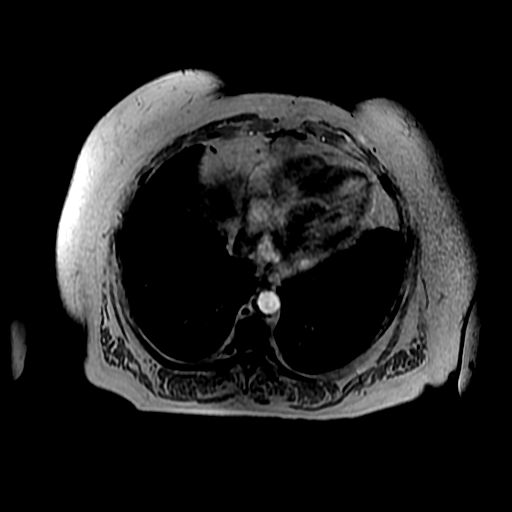

[Series 300: DWI · axial · 6.0mm · 1.76mm/px · 1 of 34 slices shown]
[im 1/34]
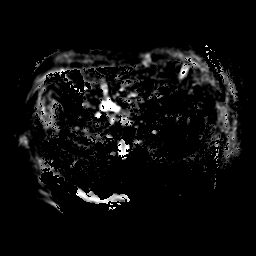

[Series 900: T1 dynamic · axial · 5.8mm · 0.78mm/px · z∈[-161,+69]mm · 3 of 80 slices shown (1 of 2)]
[im 1/80]
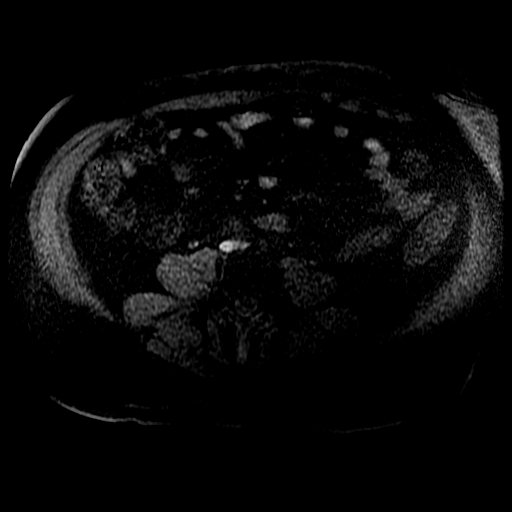
[im 40/80]
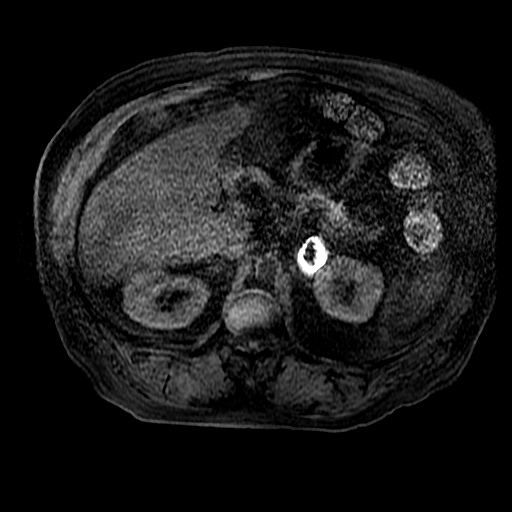
[im 80/80]
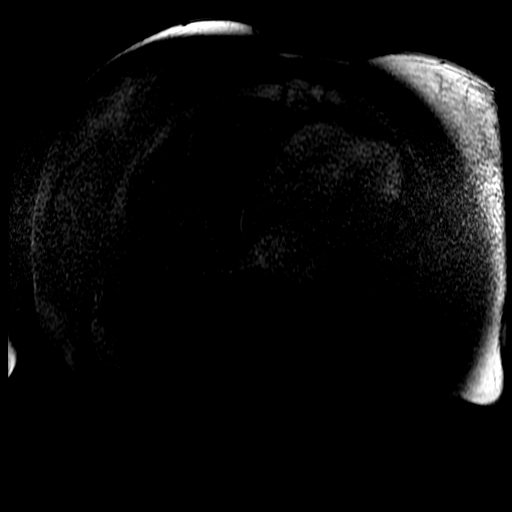

[Series 901: T1 dynamic · axial · 5.8mm · 0.78mm/px · z∈[-161,+69]mm · 3 of 80 slices shown (2 of 2)]
[im 1/80]
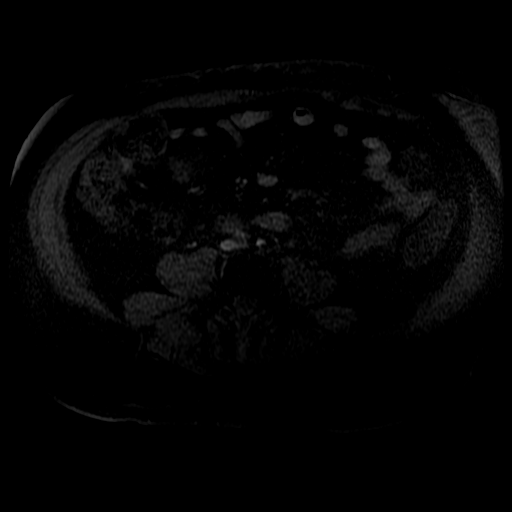
[im 40/80]
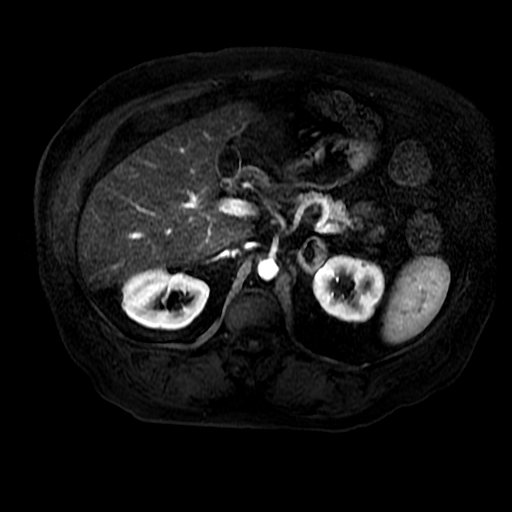
[im 80/80]
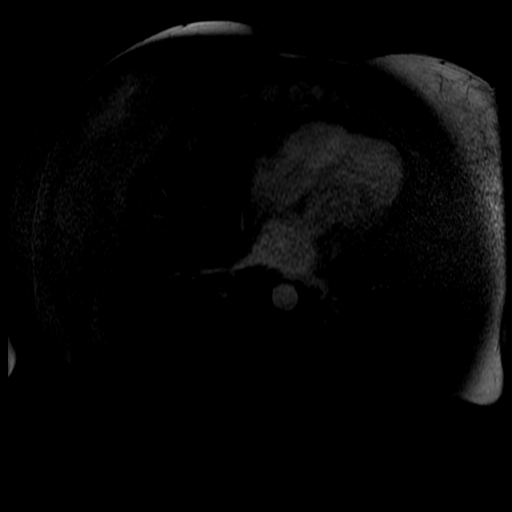

[21 of 48 positions shown; findings below may reference images not displayed]

FINDINGS: Lower chest: No acute findings.

Hepatobiliary: No hepatic masses identified. Diffuse T2
hypointensity is consistent with hemosiderosis. Gallbladder is
unremarkable. No evidence of biliary ductal dilatation.

Pancreas:  No mass or inflammatory changes.

Spleen: Within normal limits in size. No focal parenchymal lesions
identified. Diffuse T2 hypointensity, consistent with hemosiderosis.

Adrenals/Urinary Tract: Stable appearance of ablation defect in
lower pole of left kidney. No evidence of locally recurrent mass.
Several tiny renal cysts are seen bilaterally. A tiny 7 mm lesion in
upper pole of left kidney shows T1 hyperintensity and lack of
contrast enhancement, consistent with a tiny hemorrhagic cyst. No
solid renal masses are identified. No evidence of hydronephrosis.

Stomach/Bowel: Visualized portion unremarkable.

Vascular/Lymphatic: No pathologically enlarged lymph nodes
identified. No abdominal aortic aneurysm.

Other:  None.

Musculoskeletal: No suspicious bone lesions identified. Diffuse bone
marrow T2 hypointensity, consistent with hemosiderosis.
IMPRESSION: Stable appearance of ablation defect in lower pole of left kidney.
No evidence of locally recurrent or metastatic carcinoma.

Stable tiny hemorrhagic cyst in upper pole of left kidney,
corresponding with lesion on recent CT. No evidence of renal
neoplasm or hydronephrosis.

Hemosiderosis.

## 2019-11-02 ENCOUNTER — Telehealth: Payer: Self-pay | Admitting: *Deleted

## 2019-11-02 ENCOUNTER — Other Ambulatory Visit: Payer: Self-pay | Admitting: Internal Medicine

## 2019-11-02 DIAGNOSIS — D509 Iron deficiency anemia, unspecified: Secondary | ICD-10-CM

## 2019-11-02 MED ORDER — DULOXETINE HCL 60 MG PO CPEP: 60.0000 mg | ORAL_CAPSULE | Freq: Two times a day (BID) | ORAL | 0 refills | Status: DC

## 2019-11-02 MED ORDER — OMEPRAZOLE 40 MG PO CPDR: 40.0000 mg | DELAYED_RELEASE_CAPSULE | Freq: Two times a day (BID) | ORAL | 1 refills | Status: DC

## 2019-11-02 NOTE — Assessment & Plan Note (Signed)
Symptoms are currently controlled, multifactorial, in part likely due to reduction of duloxetine from bid to daily (evening dose dropped to determine if it may have been responsible for a component of her insomnia).  There was no improvementin insomnia, and her mood deteriorated (she is not having thoughts of harming herself).  Also found to be very deficient in vitamin D and in Fe, which can contribute to low mood.  Plan resume duloxetine to bid, begin high dose vit D replacement, and schedule Fe infusion (oral supplements ineffective).  She adamantly declines counseling "I've done that, it doesn't do any good, I'm tired of talking.  I'll be ok"

## 2019-11-02 NOTE — Assessment & Plan Note (Signed)
Referred for lung cancer screening CT

## 2019-11-02 NOTE — Telephone Encounter (Signed)
Iron infusion per Dr [name] - talked to [facility], scheduled next Thursday 9/30 @ 12noon at [place]. Left message on pt's vm, per her request after speaking with pt earlier today. Informed pt of date/time, register at Admissions 15 mins prior and to schedule 2nd infusion before leaving after her 1st infusion. And to call if she has any questions.

## 2019-11-02 NOTE — Assessment & Plan Note (Signed)
BP controlled this visit w/o new intervention (last visit was elevated).  No changes at this time.

## 2019-11-02 NOTE — Assessment & Plan Note (Signed)
Candida resolved with powder.  She agrees to use this in the future as it was more effective than cream.

## 2019-11-02 NOTE — Assessment & Plan Note (Signed)
Resolved.  Negative KUB.

## 2019-11-02 NOTE — Assessment & Plan Note (Signed)
Continues to wear her CPAP

## 2019-11-02 NOTE — Assessment & Plan Note (Signed)
Pt has been informed that she does have amyloidosis based on updated imaging.  SHe is not yet familiar with the implications or plan, and has f/u with cardiology.  No heart failure signs or symptoms at this time.

## 2019-11-02 NOTE — Assessment & Plan Note (Signed)
A1c remains tightly controlled; she continues to have a wide range in glucose readings.  Historically she has been accustomed to monitoring closely despite being T2

## 2019-11-02 NOTE — Progress Notes (Signed)
Established Patient Office Visit  Subjective:  Patient ID: [name], female    DOB: [birth year]  Age: 69 y.o. MRN: [id number]  CC: Depressed mood, poor energy  HPI [name] presents for f/u of chronic problems.  At last visit, we had stopped her evening duloxetine to determine if her insomnia improved (it did not improve; she has just as much difficulty falling asleep as she did prior, AND her depressed feelings and irritability have worsened).  We had begun decreasing the intensity of her gastric acid blocking therapy by decreasing PPI from bid to qd, and her symptoms worsened (resolved with resuming bid dosing).  We had obtained a KUB to look for retained stool burden to investigate a new complaint of fecal incontinence (normal bowel gas/stool pattern; the incontinence has resolved).  We had changed intertrigo treatment from nystatin cream to nystatin powder, which was effective (inframammary intertrigo has resolved).  At the most recent visit we had obtained a number of lab tests, which have revealed significant iron deficiency despite oral Fe supplements, and new severe vitamin D deficiency.    She requests RF on her nausea medication.  We discussed her frustration with DM control (she feels that she must be in a narrow range of acceptable numbers nearly all of the time, to avoid a negative consequence, though she couldn't name what that might be - she is anxious about diabetic control generally).    Since our last visit she has learned that she indeed has cardiac amyloidosis, though she isn't sure yet what this will mean.  She does have cardiology f/u.  [name] explains that she has been feeling emotionally very low and irritable.  WHile she has had a tendency to dysphoria for many many years, she now is finding little to look forward to (the exception being spending time with her great granddaughter [name], whom she adores), and is frequently tearful without  triggers. Poor energy, poor sleep, feels tired most of the time.  She is not interested in counseling (her counselor here is leaving the practice).   She desires flu shot today.  Past Medical History:  Diagnosis Date  . Anxiety 07/24/2010  . Aortic atherosclerosis ([place]) 10/19/2014   Seen on CT scan, currently asymptomatic  . 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 Arthritis    "lower back; hands" (02/19/2018)  . Asymptomatic cholelithiasis 09/25/2015   Seen on CT scan 08/2015  . Carotid artery stenosis; s/p R endarterectomy    s/p right endarterectomy (06/2010) Carotid US (07/2010):  Left: Moderate-to-severe (60-79%) calcific and non-calcific plaque origin and proximal ICA and ECA   . Chronic congestive heart failure with left ventricular diastolic dysfunction ([place]) 10/21/2010  . Chronic constipation 02/03/2011  . Chronic daily headache 01/16/2014  . Chronic iron deficiency anemia   . Chronic low back pain 10/06/2012  . Chronic venous insufficiency 08/04/2012  . COPD (chronic obstructive pulmonary disease) with emphysema (HCC)    PFTs 2018: severe obstructive disease, insignif response to bronchiodilator, mild restriction parenchymal pattern, moderately severe diffusion defect. 2014  FEV1 0.92 (40%), ratio 69, 27% increase in FEV1 with BD, TLC 91%, severe airtrapping, DLCO49% On chronic home O2. Pulmonary rehab referral 05/2012   . Fibromyalgia 08/29/2010  . Gastroesophageal reflux disease   . History of blood transfusion    "several times"  (02/19/2018)  . History of clear cell renal cell carcinoma ([place]), in remission 07/21/2011  s/p cryoablation of left RCC in 09/2011 by [name]. Followed by [name]  [facility]) .    Marland Kitchen History of hiatal hernia   . History of mitral valve replacement with bioprosthetic valve due to mitral stenosis 2012   s/p MVR with a 27-mm pericardial porcine valve  (Medtronic Mosaic valve, serial #[id number] on 09/20/10, Dr. Prescott Gum)   . History of obstructive sleep apnea, resolved 2013   resolved per sleep study 07/2019; no apnea, but did have desaturation.  CPAP no longer necessary.  Nocturnal polysomnography (06/2009): Moderate sleep apnea/ hypopnea syndrome , AHI 17.8 per hour with nonpositional hypopneas. CPAP titration to 12 CWP, AHI 2.4 per hour. On nocturnal CPAP via a small resMed Quattro full-face mask with heated humidifier.   Marland Kitchen History of pneumonia    "once"  (02/19/2018)  . History of seborrheic keratosis 09/28/2015  . Hyperlipidemia LDL goal < 100 11/20/2005  . Internal and external hemorrhoids without complication 7/78/2423  . Lichen sclerosus of female genitalia 01/12/2017  . Migraine    "none in years" (02/19/2018)  . Moderately severe major depression ([place]) 11/19/2005  . Nocturnal hypoxia per sleep study 07/2019   . Osteoporosis    DEXA 2016: T -2.7; DEXA (12/09/2011): L-spine T -3.7, left hip T -1.4 DEXA (12/2004): L-spine T -2.6, left hip -0.1   . Paroxysmal atrial fibrillation ([place]) 10/22/2010   s/p Left atrial maze procedure for paroxysmal atrial fibrillation on 09/20/2010 by Dr Prescott Gum.  Subsequent splenic infarct, decision was made to re-anticoagulate with coumadin, likely life-long as this is the most likely cause of the splenic infarct.   . Personal history of colonic polyps 05/14/2011   Colonoscopy (05/2011): 4 mm adenomatous polyp excised endoscopically Colonoscopy (02/2002): Adenomatous polyp excised endoscopically   . Pulmonary hypertension due to chronic obstructive pulmonary disease ([place]) 04/25/2016   2014 TEE w PA peak pressure 46 mmHg, s/p MV replacement   . Right nephrolithiasis, asymptomatic, incidental finding 09/06/2014   5 mm non-obstructing calculus seen on CT scan 09/05/2014   . Severe obesity (BMI 35.0-39.9) with comorbidity ([place]) 10/23/2011  . Tobacco abuse 07/28/2012  . Type 2 diabetes mellitus with diabetic  neuropathy [facility])     Past Surgical History:  Procedure Laterality Date  . CARDIAC CATHETERIZATION    . CARDIAC VALVE REPLACEMENT  Aug. 2012   "mitral valve"  . CAROTID ENDARTERECTOMY Right 07/04/2010   by [name] for asymptomatic right carotid artery stenosis  . CATARACT EXTRACTION W/ INTRAOCULAR LENS  IMPLANT, BILATERAL Bilateral   . CHEST TUBE INSERTION  09/24/2010   Dr Prescott Gum  . COLONOSCOPY  05/12/2011   performed by Dr. Michail Sermon. Showing small internal hemorrhoids, single tubular adenoma polyp  . CRYOABLATION Left 09/2011   by [name]. Followed by [name]  [facility]) .    Marland Kitchen DILATION AND CURETTAGE OF UTERUS    . ESOPHAGOGASTRODUODENOSCOPY  05/12/2011   performed by Dr. Michail Sermon. Negative for ulcerations, biopsy negative for evidence of celiac sprue  . ESOPHAGOGASTRODUODENOSCOPY N/A 06/29/2015   Procedure: ESOPHAGOGASTRODUODENOSCOPY (EGD);  Surgeon: Clarene Essex, MD;  Location: [facility] ENDOSCOPY;  Service: Endoscopy;  Laterality: N/A;  . ESOPHAGOGASTRODUODENOSCOPY N/A 03/29/2016   Procedure: ESOPHAGOGASTRODUODENOSCOPY (EGD);  Surgeon: Clarene Essex, MD;  Location: [facility] ENDOSCOPY;  Service: Endoscopy;  Laterality: N/A;  . FRACTURE SURGERY    . GIVENS CAPSULE STUDY N/A 06/30/2015   Procedure: GIVENS CAPSULE STUDY;  Surgeon: Clarene Essex, MD;  Location: [facility] ENDOSCOPY;  Service: Endoscopy;  Laterality: N/A;  .  GIVENS CAPSULE STUDY N/A 06/29/2015   Procedure: GIVENS CAPSULE STUDY;  Surgeon: Clarene Essex, MD;  Location: [facility] ENDOSCOPY;  Service: Endoscopy;  Laterality: N/A;  . HEMORRHOID SURGERY  1970s?   "lanced"  . HYSTEROSCOPY W/ ENDOMETRIAL ABLATION  06/2001   for persistent post-menopausal bleeding // by S. [name], M.D.  . IR GENERIC HISTORICAL  08/23/2015   IR RADIOLOGIST EVAL & MGMT 08/23/2015 Aletta Edouard, MD [facility] INTERV RAD  . IR GENERIC HISTORICAL  04/09/2016   IR RADIOLOGIST EVAL & MGMT 04/09/2016 Aletta Edouard, MD [facility] INTERV RAD  . IR RADIOLOGIST EVAL & MGMT   10/07/2016  . IR RADIOLOGIST EVAL & MGMT  06/25/2017  . LEFT HEART CATH AND CORONARY ANGIOGRAPHY N/A 04/21/2016   Procedure: Left Heart Cath and Coronary Angiography;  Surgeon: Lorretta Harp, MD;  Location: [facility] CV LAB;  Service: Cardiovascular;  Laterality: N/A;  . LIPOMA EXCISION  08/2005   occipital lipoma 1.5cm - by Dr. [name]  . LITHOTRIPSY  ~ 2000  . MAZE Left 09/20/10   for paroxysmal atrial fibrillation (Dr. Prescott Gum)  . MITRAL VALVE REPLACEMENT  09/20/10    with a 27-mm pericardial porcine valve (Medtronic Mosaic valve, serial #[id number]). 09/20/10, Dr Prescott Gum  . ORIF CLAVICLE FRACTURE Right 01/2004   by Thana Farr. [name], M.D for Right clavicle nonunion.; "it's got a pin in it"  . REFRACTIVE SURGERY Bilateral   . RIGHT HEART CATH N/A 04/23/2016   Procedure: Right Heart Cath;  Surgeon: [name], MD;  Location: [facility];  Service: Cardiovascular;  Laterality: N/A;  . TONSILLECTOMY    . TUBAL LIGATION      Family History  Problem Relation Age of Onset  . Peptic Ulcer Disease Father   . Heart attack Father 76       Died of MI at age 62  . Heart attack Brother 44       Died of MI at age 43  . Obesity Brother   . Pneumonia Mother   . Healthy Sister   . Lupus Daughter   . Obsessive Compulsive Disorder Daughter     Social History   Socioeconomic History  . Marital status: Divorced    Spouse name: Not on file  . Number of children: Not on file  . Years of education: Not on file  . Highest education level: Not on file  Occupational History  . Not on file  Tobacco Use  . Smoking status: Current Every Day Smoker    Packs/day: 1.00    Years: 55.00    Pack years: 55.00    Types: Cigarettes    Start date: 11/01/1962  . Smokeless tobacco: Never Used  Vaping Use  . Vaping Use: Never used  Substance and Sexual Activity  . Alcohol use: Not Currently    Comment: 02/19/2018 "nothing since 1999"  . Drug use: Not Currently    Types: Cocaine, Marijuana     Comment: 02/19/2018 "nothing since the 1990s"  . Sexual activity: Not Currently    Birth control/protection: Post-menopausal  Other Topics Concern  . Not on file  Social History Narrative   Lives alone in [place] ([place])   Worked at [employer] for 18 years   No car   Social Determinants of Radio broadcast assistant Strain: [place]   . Difficulty of Paying Living Expenses: Not hard at all  Food Insecurity: No Food Insecurity  . Worried About Charity fundraiser  in the Last Year: Never true  . Ran Out of Food in the Last Year: Never true  Transportation Needs: No Transportation Needs  . Lack of Transportation (Medical): No  . Lack of Transportation (Non-Medical): No  Physical Activity: Inactive  . Days of Exercise per Week: 0 days  . Minutes of Exercise per Session: 0 min  Stress: No Stress Concern Present  . Feeling of Stress : Only a little  Social Connections: Unknown  . Frequency of Communication with Friends and Family: More than three times a week  . Frequency of Social Gatherings with Friends and Family: Not on file  . Attends Religious Services: Not on file  . Active Member of Clubs or Organizations: Not on file  . Attends Archivist Meetings: Not on file  . Marital Status: Divorced  Human resources officer Violence:   . Fear of Current or Ex-Partner: Not on file  . Emotionally Abused: Not on file  . Physically Abused: Not on file  . Sexually Abused: Not on file    Outpatient Medications Prior to Visit  Medication Sig Dispense Refill  . albuterol (PROAIR HFA) 108 (90 Base) MCG/ACT inhaler Inhale 2 puffs into the lungs every 6 (six) hours as needed for shortness of breath. 18 g 5  . ALPRAZolam (XANAX) 1 MG tablet TAKE 1 TABLET BY MOUTH EVERY NIGHT AT BEDTIME AS NEEDED FOR INSOMNIA. MAY TAKE 1/2 TABLET BY MOUTH DURING THE DAY FOR ANXIETY 55 tablet 4  . benzonatate (TESSALON) 100 MG capsule Take 1 capsule (100 mg total) by mouth 3 (three) times  daily as needed for cough. 90 capsule 11  . Blood Glucose Monitoring Suppl (ONETOUCH VERIO) W/DEVICE KIT 1 each by Does not apply route 4 (four) times daily. 1 kit 0  . buPROPion (WELLBUTRIN) 75 MG tablet Take 1 tablet (75 mg total) by mouth 2 (two) times daily. 180 tablet 3  . chlorpheniramine (CHLOR-TRIMETON) 4 MG tablet Take 4 mg by mouth daily as needed for allergies.    . clobetasol cream (TEMOVATE) 4.09 % Apply 1 application topically 2 (two) times daily as needed (lichen sclerosus flare). 45 g 3  . Continuous Blood Gluc Sensor (FREESTYLE LIBRE 2 SENSOR) MISC 1 each by Does not apply route 6 (six) times daily. To read 6 times daily with change of sensor every 2 weeks, 90 days worth with 3 refills 7 each 3  . DULoxetine (CYMBALTA) 60 MG capsule Take 1 capsule (60 mg total) by mouth daily. 180 capsule 3  . fluticasone (FLONASE) 50 MCG/ACT nasal spray INSTILL 2 SPRAYS INTO BOTH NOSTRILS DAILY 54 g 3  . Fluticasone-Salmeterol (ADVAIR) 500-50 MCG/DOSE AEPB INHALE 1 PUFF INTO THE LUNGS TWICE DAILY 180 each 3  . furosemide (LASIX) 80 MG tablet TAKE 1 TABLET BY MOUTH EVERY MORNING AND TAKE 1/2 TABLET EVERY EVENING 45 tablet 11  . gabapentin (NEURONTIN) 300 MG capsule Take 2 capsules (600 mg total) by mouth 3 (three) times daily. 540 capsule 3  . glucose blood (ONETOUCH VERIO) test strip Use to check blood sugar 4 times daily. diag code E11.40. Insulin dependent 375 each 3  . HYDROcodone-acetaminophen (NORCO/VICODIN) 5-325 MG tablet Take 2 tablets by mouth every 6 (six) hours as needed for severe pain. 114 tablet 0  . hydrocortisone cream 1 % APPLY EXTERNALLY TO THE AFFECTED AREA TWICE DAILY 28.35 g 2  . insulin aspart (NOVOLOG FLEXPEN) 100 UNIT/ML FlexPen INJECT 5 UNITS TO 20 UNITS SUBCUTANEOUSLY FOUR TIMES A DAY BEFORE MEALS AND AT  BEDTIME 15 mL 5  . Insulin Pen Needle 32G X 4 MM MISC Use to inject insulin up to 6 times a day 500 each 3  . Insulin Syringe-Needle U-100 31G X 15/64" 0.5 ML MISC Use to  inject insulin up to 4 times a day 400 each 3  . ipratropium-albuterol (DUONEB) 0.5-2.5 (3) MG/3ML SOLN INHALE THE CONTENTS OF 1 VIAL VIA NEBULIZER EVERY 6 HOURS AS NEEDED FOR SHORTNESS OF BREATH 360 mL 3  . Lancets Misc. (ACCU-CHEK FASTCLIX LANCET) KIT Check your blood 4 times a day dx code 250.00 insulin requiring 1 kit 2  . liraglutide (VICTOZA) 18 MG/3ML SOPN INJECT 1.8MG SUBCUTANEOUSLY DAILY 18 mL 3  . metoprolol succinate (TOPROL-XL) 50 MG 24 hr tablet Take 1 tablet (50 mg total) by mouth daily. Take with or immediately following a meal. 90 tablet 3  . nystatin (NYSTATIN) powder Apply 1 application topically 3 (three) times daily. 60 g 0  . omeprazole (PRILOSEC) 40 MG capsule Take 1 capsule (40 mg total) by mouth in the morning and at bedtime. 30 capsule 1  . ondansetron (ZOFRAN) 4 MG tablet TAKE 1 TABLET BY MOUTH EVERY 8 HOURS AS NEEDED FOR NAUSEA OR VOMITING 20 tablet 2  . OXYGEN Inhale 2 L into the lungs at bedtime.    . potassium chloride SA (KLOR-CON) 20 MEQ tablet Take 2 tabs by mouth every morning and 1 tab every evening to replace potassium lost from lasix use. 270 tablet 3  . PRESCRIPTION MEDICATION Inhale into the lungs at bedtime. CPAP    . rosuvastatin (CRESTOR) 20 MG tablet TAKE 1 TABLET BY MOUTH ONCE EVERY NIGHT AT BEDTIME 90 tablet 3  . SPIRIVA RESPIMAT 2.5 MCG/ACT AERS INHALE 2 PUFFS BY MOUTH EVERY DAY 4 g 11  . warfarin (COUMADIN) 2.5 MG tablet Take 2.84m warfarin on We/Th/Sa/Su; 045mon Mondays; 1.2565m1/2 x 2.5mg44mblet) on Tu/Fri. 20 tablet 1  . ferrous gluconate (FERGON) 324 MG tablet Take 1 tablet (324 mg total) by mouth 2 (two) times daily with a meal. (Patient taking differently: Take 648 mg by mouth See admin instructions. Take 2 tablets (648 mg) by mouth daily with lunch or supper) 180 tablet 3   Facility-Administered Medications Prior to Visit  Medication Dose Route Frequency Provider Last Rate Last Admin  . senna-docusate (Senokot-S) tablet 1 tablet  1 tablet Oral  [facility] PRN HoffKalman Shanliff, DO        Allergies  Allergen Reactions  . Lorazepam   . Lorazepam Other (See Comments)    Patient's sister noted that ativan caused the patient to become extremely confused during hospitalization 09/2010; tolerates Xanax  . Morphine And Related Other (See Comments)    Injection site reaction  . Oxycontin [Oxycodone] Other (See Comments)    headache  . Tramadol Hcl Swelling    Ankle swelling    ROS Review of Systems    Objective:    Physical Exam  BP (!) 124/38 (BP Location: Right Arm, Patient Position: Sitting, Cuff Size: Small)   Pulse 74   Temp 98.3 F (36.8 C) (Oral)   Ht _0  (1.575 m)   Wt 171 lb 4.8 oz (77.7 kg)   SpO2 96%   BMI 31.33 kg/m  Wt Readings from Last 3 Encounters:  10/13/19 171 lb 4.8 oz (77.7 kg)  09/27/19 167 lb (75.8 kg)  09/26/19 167 lb (75.8 kg)   [name] a sad affect today.  Awake and alert, she did readily converse  and her cognitive processing was normal.  Lungs clear, no LE edema, feet dusky and warm, pulseless (at last visit the dps were barely palpable).    Lab Results  Component Value Date   TSH 1.200 09/01/2019   Lab Results  Component Value Date   WBC 9.1 09/26/2019   HGB 10.1 (L) 09/26/2019   HCT 34.0 (L) 09/26/2019   MCV 82.1 09/26/2019   PLT 222 09/26/2019   Lab Results  Component Value Date   NA 135 09/26/2019   K 4.2 09/26/2019   CO2 25 09/26/2019   GLUCOSE 276 (H) 09/26/2019   BUN 16 09/26/2019   CREATININE 1.11 (H) 09/26/2019   BILITOT 0.6 09/26/2019   ALKPHOS 60 09/26/2019   AST 15 09/26/2019   ALT 12 09/26/2019   PROT 6.8 09/26/2019   ALBUMIN 3.7 09/26/2019   CALCIUM 9.1 09/26/2019   ANIONGAP 12 09/26/2019   GFR 49.06 (L) 12/29/2012   Lab Results  Component Value Date   CHOL 122 09/26/2019   Lab Results  Component Value Date   HDL 35 (L) 09/26/2019   Lab Results  Component Value Date   LDLCALC 46 09/26/2019   Lab Results  Component Value Date   TRIG  204 (H) 09/26/2019   Lab Results  Component Value Date   CHOLHDL 3.5 09/26/2019   Lab Results  Component Value Date   HGBA1C 8.3 (A) 09/01/2019     Assessment & Plan:   [name] has considerable multimorbidity, and today we focused primarily on her most immediately negatively impactful problem which is worsening of her depression.  Multifactorial - will resume her bid dosing of duloxetine and begin high dose vit D replacement.  Her poor energy may also have a component of Fe deficiency anemia, and she will undergo fe infusion therapy given ineffective oral supplementation.  To futher investigate this anemia, and because she is overdue for a post-polypectomy colonoscopy (completed 2013), she will be referred to GI.  I will review her diabetes treatment plan - goal continues to be simplification of her overall regimen (significant polypharmacy). Screening CT chest for lung cancer (smoking history not addressed today) was ordered last month.  She will receive flu shot today.   See problem based charting for additional details.   Meds ordered this encounter  Medications  . DULoxetine (CYMBALTA) 60 MG capsule    Sig: Take 1 capsule (60 mg total) by mouth 2 (two) times daily.    Dispense:  60 capsule    Refill:  0    Do not fill prescription until she is due for a refill to avoid excessive supply  . omeprazole (PRILOSEC) 40 MG capsule    Sig: Take 1 capsule (40 mg total) by mouth in the morning and at bedtime.    Dispense:  30 capsule    Refill:  1    Follow-up:  1 M    Angelica Pou, MD

## 2019-11-02 NOTE — Assessment & Plan Note (Signed)
TC 122, LDL 46 (non fasting).  No change in therapy at this time.  Recheck annually.

## 2019-11-02 NOTE — Telephone Encounter (Signed)
Agree thank you

## 2019-11-02 NOTE — Assessment & Plan Note (Addendum)
Hgb 10.1, MCV 82.1 RDW 16.5. Fe 43, TIBC 417, 10% saturation, ferritin 8.  Folate 8.6.,  B12 427. Inadeqaut response to oral iron, will proceed with Feraheme infusion.  She will be referred to a GI group for colonoscopy.

## 2019-11-02 NOTE — Telephone Encounter (Signed)
Agree

## 2019-11-02 NOTE — Assessment & Plan Note (Addendum)
Foot exam today dusky warm, absent pulses, no lesions, no tenderness or pain. Toe skin is shiny, taught.

## 2019-11-02 NOTE — Assessment & Plan Note (Signed)
Attempted gradual reduction in chronic intensive therapy; sxs returned when PPI bid was reduced to PPI qd.  Resumed bid dosing for now.

## 2019-11-02 NOTE — Telephone Encounter (Signed)
Thank you

## 2019-11-02 NOTE — Assessment & Plan Note (Signed)
No improvement in sleep with elimintation of night duloxetine, and depression symptoms have worsened.  Resumed bid dosing.  SHe is depressed and has chronic pain, has sleep fragmentation, and treatment of these underlying conditions is first goal for intervention.

## 2019-11-02 NOTE — Assessment & Plan Note (Signed)
Lost to f/u after polypectomy 2013; refer to GI

## 2019-11-03 ENCOUNTER — Encounter: Payer: HMO | Admitting: Internal Medicine

## 2019-11-05 MED ORDER — ALBUTEROL SULFATE HFA 108 (90 BASE) MCG/ACT IN AERS: 2.0000 | INHALATION_SPRAY | Freq: Four times a day (QID) | RESPIRATORY_TRACT | 5 refills | Status: DC | PRN

## 2019-11-05 MED ORDER — ONDANSETRON HCL 4 MG PO TABS: 4.0000 mg | ORAL_TABLET | Freq: Three times a day (TID) | ORAL | 2 refills | Status: DC | PRN

## 2019-11-07 ENCOUNTER — Telehealth: Payer: Self-pay | Admitting: Pharmacist

## 2019-11-07 ENCOUNTER — Telehealth (HOSPITAL_COMMUNITY): Payer: Self-pay | Admitting: Vascular Surgery

## 2019-11-07 NOTE — Telephone Encounter (Signed)
Patient texts results of PST FS POC INR = 1.8 on 13.44m warfarin/wk as:  Su-2.539m-0mgT-2.5mgW-2.5mgTh-2.5mgF-1.25mgSa-2.5mg CONTINUE same regimen. Denies any symptoms of bleeding; no symptoms of embolic disease. Adequate supply on-hand. Repeat PST FS POC INR at home in seven days and share results with me.

## 2019-11-07 NOTE — Telephone Encounter (Signed)
Left pt message giving PYP scan appt , 10/29 @ 945 asked pt to call back to confirm

## 2019-11-08 DIAGNOSIS — D509 Iron deficiency anemia, unspecified: Secondary | ICD-10-CM | POA: Diagnosis not present

## 2019-11-08 DIAGNOSIS — Z86010 Personal history of colonic polyps: Secondary | ICD-10-CM | POA: Diagnosis not present

## 2019-11-09 NOTE — Discharge Instructions (Signed)
Ferumoxytol injection What is this medicine? FERUMOXYTOL is an iron complex. Iron is used to make healthy red blood cells, which carry oxygen and nutrients throughout the body. This medicine is used to treat iron deficiency anemia. This medicine may be used for other purposes; ask your health care provider or pharmacist if you have questions. COMMON BRAND NAME(S): Feraheme What should I tell my health care provider before I take this medicine? They need to know if you have any of these conditions:  anemia not caused by low iron levels  high levels of iron in the blood  magnetic resonance imaging (MRI) test scheduled  an unusual or allergic reaction to iron, other medicines, foods, dyes, or preservatives  pregnant or trying to get pregnant  breast-feeding How should I use this medicine? This medicine is for injection into a vein. It is given by a health care professional in a hospital or clinic setting. Talk to your pediatrician regarding the use of this medicine in children. Special care may be needed. Overdosage: If you think you have taken too much of this medicine contact a poison control center or emergency room at once. NOTE: This medicine is only for you. Do not share this medicine with others. What if I miss a dose? It is important not to miss your dose. Call your doctor or health care professional if you are unable to keep an appointment. What may interact with this medicine? This medicine may interact with the following medications:  other iron products This list may not describe all possible interactions. Give your health care provider a list of all the medicines, herbs, non-prescription drugs, or dietary supplements you use. Also tell them if you smoke, drink alcohol, or use illegal drugs. Some items may interact with your medicine. What should I watch for while using this medicine? Visit your doctor or healthcare professional regularly. Tell your doctor or healthcare  professional if your symptoms do not start to get better or if they get worse. You may need blood work done while you are taking this medicine. You may need to follow a special diet. Talk to your doctor. Foods that contain iron include: whole grains/cereals, dried fruits, beans, or peas, leafy green vegetables, and organ meats (liver, kidney). What side effects may I notice from receiving this medicine? Side effects that you should report to your doctor or health care professional as soon as possible:  allergic reactions like skin rash, itching or hives, swelling of the face, lips, or tongue  breathing problems  changes in blood pressure  feeling faint or lightheaded, falls  fever or chills  flushing, sweating, or hot feelings  swelling of the ankles or feet Side effects that usually do not require medical attention (report to your doctor or health care professional if they continue or are bothersome):  diarrhea  headache  nausea, vomiting  stomach pain This list may not describe all possible side effects. Call your doctor for medical advice about side effects. You may report side effects to FDA at 1-800-FDA-1088. Where should I keep my medicine? This drug is given in a hospital or clinic and will not be stored at home. NOTE: This sheet is a summary. It may not cover all possible information. If you have questions about this medicine, talk to your doctor, pharmacist, or health care provider.  2020 Elsevier/Gold Standard (2016-03-17 20:21:10)

## 2019-11-10 ENCOUNTER — Ambulatory Visit (HOSPITAL_COMMUNITY)
Admission: RE | Admit: 2019-11-10 | Discharge: 2019-11-10 | Disposition: A | Discharge status: Home / Self Care | Payer: HMO | Source: Ambulatory Visit | Attending: Internal Medicine | Admitting: Internal Medicine

## 2019-11-10 ENCOUNTER — Other Ambulatory Visit: Payer: Self-pay

## 2019-11-10 ENCOUNTER — Other Ambulatory Visit: Payer: Self-pay | Admitting: Internal Medicine

## 2019-11-10 DIAGNOSIS — D509 Iron deficiency anemia, unspecified: Secondary | ICD-10-CM

## 2019-11-10 MED ORDER — SODIUM CHLORIDE 0.9 % IV SOLN
510.0000 mg | Freq: Once | INTRAVENOUS | Status: AC
  Administered 2019-11-10: 510 mg via INTRAVENOUS
  Filled 2019-11-10: qty 17

## 2019-11-11 ENCOUNTER — Other Ambulatory Visit: Payer: Self-pay | Admitting: Student in an Organized Health Care Education/Training Program

## 2019-11-11 DIAGNOSIS — J439 Emphysema, unspecified: Secondary | ICD-10-CM

## 2019-11-14 ENCOUNTER — Telehealth: Payer: Self-pay | Admitting: Pharmacist

## 2019-11-14 ENCOUNTER — Telehealth: Payer: Self-pay | Admitting: Dietician

## 2019-11-14 NOTE — Telephone Encounter (Signed)
Discussed with Dr. Elie Confer in real time.  Patient will need to be seen sooner if develops worsening bleeding, swelling, erythema, warmth, fever or other signs of infections.  Preferentially see sooner than Thursday.   Gilles Chiquito, MD

## 2019-11-14 NOTE — Telephone Encounter (Signed)
Sent a picture to Dr. Elie Confer, she suggested we  look at it and call her back. Note that this has already been addressed by Dr. Elie Confer and Daryll Drown.

## 2019-11-14 NOTE — Telephone Encounter (Signed)
Patient texted results of PST FS POC INR = 1.9 (target 1.5 - 2.5) on 13.45m warfarin/wk as: Su-2.569m-0mgT-2.5mgW-2.5mgTh-2.5mgF-1.25mgSa-2.5mg CONTINUE this regimen. F/U INR in one week. Patient also identified an area over the right great toe that she states she noticed only last PM. Has been to a peEngineer, manufacturingNo bleeding, no drainage from site. She has an appointment to be seen this Thursday, 7-OCT-21 for this. Discussed with Dr. MuDaryll DrownFaculty Attending Physician today) and shared/showed Dr. MuDaryll Drownn image of the great toe that had been sent by the patient. Dr. MuDaryll Drownsked to determine if we had slots to see the patient today in [facility]. When the patient was queried relative to her ability to come to clinic today, she indicated she could not--that she"had to take my [facility] to the [facility]."  She stated that she would wait until Thursday.

## 2019-11-16 NOTE — Telephone Encounter (Signed)
Uploaded blood sugars from her Continuous glucose monitor for her appointment tomorrow. Will print it and give it to Dr. [name].

## 2019-11-17 ENCOUNTER — Ambulatory Visit (HOSPITAL_COMMUNITY)
Admission: RE | Admit: 2019-11-17 | Discharge: 2019-11-17 | Disposition: A | Discharge status: Home / Self Care | Payer: HMO | Source: Ambulatory Visit | Attending: Internal Medicine | Admitting: Internal Medicine

## 2019-11-17 ENCOUNTER — Encounter (HOSPITAL_COMMUNITY): Payer: HMO

## 2019-11-17 ENCOUNTER — Encounter: Payer: Self-pay | Admitting: Internal Medicine

## 2019-11-17 ENCOUNTER — Ambulatory Visit (INDEPENDENT_AMBULATORY_CARE_PROVIDER_SITE_OTHER): Payer: HMO | Admitting: Internal Medicine

## 2019-11-17 ENCOUNTER — Other Ambulatory Visit: Payer: Self-pay

## 2019-11-17 VITALS — BP 119/49 | HR 76 | Temp 98.5°F | Ht 62.0 in | Wt 168.1 lb

## 2019-11-17 DIAGNOSIS — K219 Gastro-esophageal reflux disease without esophagitis: Secondary | ICD-10-CM

## 2019-11-17 DIAGNOSIS — I43 Cardiomyopathy in diseases classified elsewhere: Secondary | ICD-10-CM

## 2019-11-17 DIAGNOSIS — D509 Iron deficiency anemia, unspecified: Secondary | ICD-10-CM | POA: Insufficient documentation

## 2019-11-17 DIAGNOSIS — E1140 Type 2 diabetes mellitus with diabetic neuropathy, unspecified: Secondary | ICD-10-CM | POA: Diagnosis not present

## 2019-11-17 DIAGNOSIS — M545 Low back pain, unspecified: Secondary | ICD-10-CM | POA: Diagnosis not present

## 2019-11-17 DIAGNOSIS — I739 Peripheral vascular disease, unspecified: Secondary | ICD-10-CM

## 2019-11-17 DIAGNOSIS — Z860100 Personal history of colon polyps, unspecified: Secondary | ICD-10-CM

## 2019-11-17 DIAGNOSIS — I2723 Pulmonary hypertension due to lung diseases and hypoxia: Secondary | ICD-10-CM

## 2019-11-17 DIAGNOSIS — Z760 Encounter for issue of repeat prescription: Secondary | ICD-10-CM

## 2019-11-17 DIAGNOSIS — M810 Age-related osteoporosis without current pathological fracture: Secondary | ICD-10-CM

## 2019-11-17 DIAGNOSIS — F321 Major depressive disorder, single episode, moderate: Secondary | ICD-10-CM

## 2019-11-17 DIAGNOSIS — Z7901 Long term (current) use of anticoagulants: Secondary | ICD-10-CM

## 2019-11-17 DIAGNOSIS — E854 Organ-limited amyloidosis: Secondary | ICD-10-CM

## 2019-11-17 DIAGNOSIS — G250 Essential tremor: Secondary | ICD-10-CM | POA: Insufficient documentation

## 2019-11-17 DIAGNOSIS — S90819A Abrasion, unspecified foot, initial encounter: Secondary | ICD-10-CM | POA: Insufficient documentation

## 2019-11-17 DIAGNOSIS — J449 Chronic obstructive pulmonary disease, unspecified: Secondary | ICD-10-CM

## 2019-11-17 DIAGNOSIS — I10 Essential (primary) hypertension: Secondary | ICD-10-CM | POA: Diagnosis not present

## 2019-11-17 DIAGNOSIS — Z794 Long term (current) use of insulin: Secondary | ICD-10-CM

## 2019-11-17 DIAGNOSIS — G8929 Other chronic pain: Secondary | ICD-10-CM

## 2019-11-17 DIAGNOSIS — Z9981 Dependence on supplemental oxygen: Secondary | ICD-10-CM

## 2019-11-17 DIAGNOSIS — R519 Headache, unspecified: Secondary | ICD-10-CM | POA: Diagnosis not present

## 2019-11-17 DIAGNOSIS — E559 Vitamin D deficiency, unspecified: Secondary | ICD-10-CM

## 2019-11-17 DIAGNOSIS — R251 Tremor, unspecified: Secondary | ICD-10-CM

## 2019-11-17 DIAGNOSIS — K439 Ventral hernia without obstruction or gangrene: Secondary | ICD-10-CM | POA: Diagnosis not present

## 2019-11-17 DIAGNOSIS — F419 Anxiety disorder, unspecified: Secondary | ICD-10-CM

## 2019-11-17 MED ORDER — HYDROCODONE-ACETAMINOPHEN 5-325 MG PO TABS: 1.0000 | ORAL_TABLET | Freq: Four times a day (QID) | ORAL | 0 refills | Status: DC | PRN

## 2019-11-17 MED ORDER — KETOROLAC TROMETHAMINE 30 MG/ML IJ SOLN
30.0000 mg | Freq: Once | INTRAMUSCULAR | Status: AC
  Administered 2019-11-17: 30 mg via INTRAMUSCULAR

## 2019-11-17 MED ORDER — SODIUM CHLORIDE 0.9 % IV SOLN
510.0000 mg | Freq: Once | INTRAVENOUS | Status: AC
  Administered 2019-11-17: 510 mg via INTRAVENOUS
  Filled 2019-11-17: qty 17

## 2019-11-17 MED ORDER — XULTOPHY 100-3.6 UNIT-MG/ML ~~LOC~~ SOPN: 24.0000 [IU] | PEN_INJECTOR | Freq: Every day | SUBCUTANEOUS | 2 refills | Status: DC

## 2019-11-17 NOTE — Patient Instructions (Addendum)
[name], I was glad to see you today, and wish that things were going better for you.  We discussed the many active conditions and evaluations you are undergoing, and how stressful this has been.  I have refilled your pain medicine.   Today we are rechecking your blood counts to determine if you are responding to the iron infusion.  I encouraged you to start taking your vitamin D therapy, since your levels are extremely low, and supplementing may actually help you feel a bit better (it's certainly necessary to help prevent broken bones if you should fall).  We talked about your hernia pain, and whether you might be interested in a surgical opinion.  You are going to hold off on that for now.  We are going to simplify your diabetes treatment by eliminating your mealtime insulin, and starting a long acting insulin.  There is a combo injection which includes both the long acting insulin and your liraglutide!  I have sent this prescription to the [facility].  The instructions will be on the product.  YOu will take this injection once a day, without respect to mealtimes.  Please keep a close eye on your heels!  I hope to see you again in about a month.    Thank you so much for your patience through these long visits!  Dr. [name]

## 2019-11-17 NOTE — Assessment & Plan Note (Signed)
Inadequate control, complex insulin regimen is frustrating.  Will stop her prandial insulin, begin combo long acting insulin and liraglutide (Xultophy).  Next A1C 12/2019.

## 2019-11-17 NOTE — Assessment & Plan Note (Signed)
EGD is planned for this Fall.  Symptoms improved when increasing PPI from daily to bid.

## 2019-11-17 NOTE — Assessment & Plan Note (Signed)
SHe reports this becoming more symptomatic, most bothersome with movement.  We discussed possibility of surgical opinion which she declines at this time due to concern about risks of anesthesia.

## 2019-11-17 NOTE — Assessment & Plan Note (Signed)
Severe vitamin D deficiency recently identified; she is encouraged to begin her vitamin D replacement therapy.

## 2019-11-17 NOTE — Assessment & Plan Note (Signed)
Mood has improved after resuming bid duloxetine. Her chronic conditions are overwhelming.  SHe declines counseling.

## 2019-11-17 NOTE — Assessment & Plan Note (Signed)
2nd of 2 Fe infusions today.  CBC today.  Upper/lower endoscopy is being scheduled soon to look for source.

## 2019-11-17 NOTE — Assessment & Plan Note (Signed)
Pulseless feet are dusky in dependent position.  No ischemic skin changes, though she does have some posterior heel abrasions from aggressive pedicure which will be monitored.

## 2019-11-17 NOTE — Assessment & Plan Note (Signed)
Cardiology has planned another scan, and office with Dr. Aundra Dubin to review the condition/treatment/prognosis is scheduled for later this Fall.  SHe has not needed her stable dose of lasix changed; no LE edema, no increased difficulty breathing.

## 2019-11-17 NOTE — Assessment & Plan Note (Signed)
BP is tightly controlled on diuretic and BB.

## 2019-11-17 NOTE — Assessment & Plan Note (Signed)
Colonoscopy is being scheduled this Fall.

## 2019-11-17 NOTE — Assessment & Plan Note (Signed)
No JVD or LE edema this visit.  Monitor.

## 2019-11-17 NOTE — Progress Notes (Signed)
Established Patient Office Visit  Subjective:  Patient ID: [name], female    DOB: Feb 01, 1951  Age: 69 y.o. MRN: [id number]  CC:  "I feel terrible, and I need a refill on my pain medicine"   HPI [name] presents for f/u of her chronic medical conditions and to request RF on her opioid prescription for which she is due.   Since last visit, she has had 1 of 2 (the second is today) iron infusions - she has noted no change in her malaise or fatigue.  She has been contacted by the GI department, and plan is for upper and lower endoscopy as an inpatient.  SHe is to have her anticoagulant held prior.  She has a f/u heart scan upcoming, followed by a visit with cardiologist Dr. Aundra Dubin, regarding her new diagnosis of cardiac amyloidosis.  She continues to fee generally very poorly; chronic pain of arms/legs/back (her opioid prescription relieves all bu the back pain, which doesn't respond), daily headaches, poor energy, anxiety, depressed mood (did improve when changing duloxetine back to bid from daily), and overwhelm. "There's too much to keep track of, I just want to feel ok, I'm not even asking to feel good."    Daily headaches have been generalized, without change in vision or balance, photo/phonophobia, or nausea.  She requests "a shot" today as her medications are not relieving the pain.  She has had an intermittent tremor of L>R arm for a long time, and has found this worsening lately, more prominent in the morning.  A former doctor suggested to her that she might have Parkinson's disease.    Her most symptomatic concern recently though has been L abdominal wall hernia pain, which is exacerbated by movement/change in position.  SHe continues to have episodic epigastric pain (it did improve when she increased PPI from once daily to twice daily).    SHe is concerned about some changes in foot skin following a pedicure - "he scrubbed my feet and legs pretty hard,  and when I left the backs of my heels were bleeding".  A similar excoriation developed on dorsal surface of R great toe; this has resolved.   Outpatient Medications Prior to Visit  Medication Sig Dispense Refill  . albuterol (PROAIR HFA) 108 (90 Base) MCG/ACT inhaler Inhale 2 puffs into the lungs every 6 (six) hours as needed for shortness of breath. 18 g 5  . ALPRAZolam (XANAX) 1 MG tablet TAKE 1 TABLET BY MOUTH EVERY NIGHT AT BEDTIME AS NEEDED FOR INSOMNIA. MAY TAKE 1/2 TABLET BY MOUTH DURING THE DAY FOR ANXIETY 55 tablet 4  . benzonatate (TESSALON) 100 MG capsule Take 1 capsule (100 mg total) by mouth 3 (three) times daily as needed for cough. 90 capsule 11  . Blood Glucose Monitoring Suppl (ONETOUCH VERIO) W/DEVICE KIT 1 each by Does not apply route 4 (four) times daily. 1 kit 0  . buPROPion (WELLBUTRIN) 75 MG tablet Take 1 tablet (75 mg total) by mouth 2 (two) times daily. 180 tablet 3  . chlorpheniramine (CHLOR-TRIMETON) 4 MG tablet Take 4 mg by mouth daily as needed for allergies.    . Cholecalciferol (VITAMIN D3) 1.25 MG (50000 UT) TABS Take 1 tab once a week for 4 weeks, then take 1 tab once a month for 5 months then stop. 10 tablet 0  . clobetasol cream (TEMOVATE) 4.19 % Apply 1 application topically 2 (two) times daily as needed (lichen sclerosus flare). 45 g 3  . Continuous  Blood Gluc Sensor (FREESTYLE LIBRE 2 SENSOR) MISC 1 each by Does not apply route 6 (six) times daily. To read 6 times daily with change of sensor every 2 weeks, 90 days worth with 3 refills 7 each 3  . DULoxetine (CYMBALTA) 60 MG capsule Take 1 capsule (60 mg total) by mouth 2 (two) times daily. 60 capsule 0  . fluticasone (FLONASE) 50 MCG/ACT nasal spray INSTILL 2 SPRAYS INTO BOTH NOSTRILS DAILY 54 g 3  . Fluticasone-Salmeterol (ADVAIR) 500-50 MCG/DOSE AEPB INHALE 1 PUFF INTO THE LUNGS TWICE DAILY 180 each 3  . furosemide (LASIX) 80 MG tablet TAKE 1 TABLET BY MOUTH EVERY MORNING AND TAKE 1/2 TABLET EVERY EVENING 45  tablet 11  . gabapentin (NEURONTIN) 300 MG capsule Take 2 capsules (600 mg total) by mouth 3 (three) times daily. 540 capsule 3  . glucose blood (ONETOUCH VERIO) test strip Use to check blood sugar 4 times daily. diag code E11.40. Insulin dependent 375 each 3  . HYDROcodone-acetaminophen (NORCO/VICODIN) 5-325 MG tablet Take 2 tablets by mouth every 6 (six) hours as needed for severe pain. 114 tablet 0  . hydrocortisone cream 1 % APPLY EXTERNALLY TO THE AFFECTED AREA TWICE DAILY 28.35 g 2  . insulin aspart (NOVOLOG FLEXPEN) 100 UNIT/ML FlexPen INJECT 5 UNITS TO 20 UNITS SUBCUTANEOUSLY FOUR TIMES A DAY BEFORE MEALS AND AT BEDTIME 15 mL 5  . Insulin Pen Needle 32G X 4 MM MISC Use to inject insulin up to 6 times a day 500 each 3  . Insulin Syringe-Needle U-100 31G X 15/64" 0.5 ML MISC Use to inject insulin up to 4 times a day 400 each 3  . ipratropium-albuterol (DUONEB) 0.5-2.5 (3) MG/3ML SOLN INHALE THE CONTENTS OF 1 VIAL VIA NEBULIZER EVERY 6 HOURS AS NEEDED FOR SHORTNESS OF BREATH 360 mL 3  . Lancets Misc. (ACCU-CHEK FASTCLIX LANCET) KIT Check your blood 4 times a day dx code 250.00 insulin requiring 1 kit 2  . liraglutide (VICTOZA) 18 MG/3ML SOPN INJECT 1.8MG SUBCUTANEOUSLY DAILY 18 mL 3  . metoprolol succinate (TOPROL-XL) 50 MG 24 hr tablet Take 1 tablet (50 mg total) by mouth daily. Take with or immediately following a meal. 90 tablet 3  . nystatin (NYSTATIN) powder Apply 1 application topically 3 (three) times daily. 60 g 0  . omeprazole (PRILOSEC) 40 MG capsule Take 1 capsule (40 mg total) by mouth in the morning and at bedtime. 30 capsule 1  . ondansetron (ZOFRAN) 4 MG tablet Take 1 tablet (4 mg total) by mouth every 8 (eight) hours as needed for nausea or vomiting. 20 tablet 2  . OXYGEN Inhale 2 L into the lungs at bedtime.    . potassium chloride SA (KLOR-CON) 20 MEQ tablet Take 2 tabs by mouth every morning and 1 tab every evening to replace potassium lost from lasix use. 270 tablet 3  .  PRESCRIPTION MEDICATION Inhale into the lungs at bedtime. CPAP     . rosuvastatin (CRESTOR) 20 MG tablet TAKE 1 TABLET BY MOUTH ONCE EVERY NIGHT AT BEDTIME 90 tablet 3  . SPIRIVA RESPIMAT 2.5 MCG/ACT AERS INHALE 2 PUFFS BY MOUTH EVERY DAY 4 g 11  . warfarin (COUMADIN) 2.5 MG tablet Take 2.13m warfarin on We/Th/Sa/Su; 038mon Mondays; 1.2568m1/2 x 2.5mg85mblet) on Tu/Fri. 20 tablet 1   Facility-Administered Medications Prior to Visit  Medication Dose Route Frequency Provider Last Rate Last Admin  . senna-docusate (Senokot-S) tablet 1 tablet  1 tablet Oral QHS PRN HoffValinda Party  DO       Review of Systems   Objective:    Physical Exam  BP (!) 119/49 (BP Location: Right Arm, Patient Position: Sitting, Cuff Size: Small)   Pulse 76   Temp 98.5 F (36.9 C) (Oral)   Ht _0  (1.575 m)   Wt 168 lb 1.6 oz (76.2 kg)   SpO2 93%   BMI 30.75 kg/m   Nicely dressed and groomed, alert and conversant.  Shows me carefully documented notes on her health readings.  Affect is normal, not flat today.  No tearfulness, though she appears tired.  Skin is warm and dry with mildly decreased turgor.  No JVD, no LE edema, though her calves are tight (non pitting).  No difficulty breathing.  Pulse irreg irreg.  Mottled cool knees and cyanotic but warm feet which are pulseless; CR is 3s. Habitus is apple (increased waist to hip ratio).     Assessment & Plan:   See problem based charting for details of the following and other problems:  Ms. Mcintire has significant multimorbidity and is actively undergoing evaluations and treatments for a number of problems, all of which is overwhelming her.  She is not feeling well and understandably has depressed mood (adjustment disorder related to chronic disease and its impact on physical function).  Mood has improved with increase in her duloxetine, and bupropion continues.  Endoscopy is important step in evaluating her chronic fe-deficiency anemia.  Will obtain  CBC today; 2nd Fe infusion is today.    SHe has not yet started her vitamin D replacement therapy for her severe vitamin D deficiency (she is encouraged to do this, as levels as low as hers can be associated by generally feeling poorly in addition to risk factor for osteoporotic fractures).  To simplify DM management, and in cooperation with diabetes dietician, plan is to simplify Ms. Lorimer's regimen by stopping her prandial insulin, and starting long acting insulin in a combo with her existing liraglutide (Xultophy).  Next A1C in 12/2019.  We discussed option of surgical treatment opinion for her hernia; she is not enthused about anesthesia and we won't consider this further at this time.     Follow-up: 3-4 weeks   Angelica Pou, MD

## 2019-11-18 LAB — CBC
Hematocrit: 32.1 % — ABNORMAL LOW (ref 34.0–46.6)
Hemoglobin: 9.6 g/dL — ABNORMAL LOW (ref 11.1–15.9)
MCH: 24.3 pg — ABNORMAL LOW (ref 26.6–33.0)
MCHC: 29.9 g/dL — ABNORMAL LOW (ref 31.5–35.7)
MCV: 81 fL (ref 79–97)
NRBC: 1 % — ABNORMAL HIGH (ref 0–0)
Platelets: 235 10*3/uL (ref 150–450)
RBC: 3.95 x10E6/uL (ref 3.77–5.28)
RDW: 22.5 % — ABNORMAL HIGH (ref 11.7–15.4)
WBC: 9.7 10*3/uL (ref 3.4–10.8)

## 2019-11-21 ENCOUNTER — Telehealth: Payer: Self-pay | Admitting: *Deleted

## 2019-11-21 DIAGNOSIS — Z7901 Long term (current) use of anticoagulants: Secondary | ICD-10-CM | POA: Diagnosis not present

## 2019-11-21 DIAGNOSIS — I480 Paroxysmal atrial fibrillation: Secondary | ICD-10-CM | POA: Diagnosis not present

## 2019-11-21 NOTE — Telephone Encounter (Signed)
Received fax from [facility] with INR results from 07/04/2019 to 11/21/2019. Last INR 1.9 on 11/21/2019. Results placed in Dr. Gladstone Pih office. Hubbard Hartshorn, BSN, RN-BC

## 2019-11-23 ENCOUNTER — Telehealth: Payer: Self-pay | Admitting: Pharmacist

## 2019-11-23 NOTE — Telephone Encounter (Signed)
Noted and agree. 

## 2019-11-23 NOTE — Telephone Encounter (Signed)
Was texted by the patient her PST FS POC INR results 1.9 (goal 1.5 - 2.5). Will continue same regimen of:  2.67m on Su/Tu/We/Th/Sa; 1.266mon Friday; No wafarin on Mondays. Repeat PST FS POC INR on 18-OCT-21. No bleeding signs or symptoms.

## 2019-11-25 ENCOUNTER — Telehealth: Payer: Self-pay | Admitting: *Deleted

## 2019-11-25 ENCOUNTER — Other Ambulatory Visit: Payer: Self-pay | Admitting: Student in an Organized Health Care Education/Training Program

## 2019-11-25 DIAGNOSIS — J439 Emphysema, unspecified: Secondary | ICD-10-CM

## 2019-11-25 NOTE — Telephone Encounter (Signed)
Received call from [place], [place]. Needed to know name of new PCP who will be signing orders for home oxygen. Gave name of Dr. [name] and NPI #. Nothing further needed. Hubbard Hartshorn, BSN, RN-BC

## 2019-11-28 ENCOUNTER — Telehealth: Payer: Self-pay | Admitting: Dietician

## 2019-11-28 ENCOUNTER — Telehealth: Payer: Self-pay | Admitting: Pharmacist

## 2019-11-28 ENCOUNTER — Ambulatory Visit: Payer: HMO | Admitting: Podiatry

## 2019-11-28 DIAGNOSIS — E1140 Type 2 diabetes mellitus with diabetic neuropathy, unspecified: Secondary | ICD-10-CM

## 2019-11-28 DIAGNOSIS — Z794 Long term (current) use of insulin: Secondary | ICD-10-CM

## 2019-11-28 NOTE — Telephone Encounter (Signed)
[name] called and left messages that she had questions about her new medicine that she was going to start today. Her last message said she had taken her medicine, eaten and asked that I wait for her to call me.

## 2019-11-28 NOTE — Telephone Encounter (Signed)
Patient texts PST FS POC INR result today 1.6 (target 1.5 - 2.5, as established by previous PCP here within the clinic based on balancing efficacy with safety.)  Will INCREASE dose to one (1) tablet of her 2.32m warfarin tablet(s) DAILY---EXCEPT on Mondays, which the patient prefers "not to take warfarin." Advised this dose increase and repeat PST FS POC INR next Monday 25-OCT-21.

## 2019-11-28 NOTE — Telephone Encounter (Signed)
I agree

## 2019-11-29 NOTE — Telephone Encounter (Signed)
Let's go to 27 units. I'll make the change in her med list.  Would you be able to contact her tmro please? Many thanks

## 2019-11-29 NOTE — Telephone Encounter (Addendum)
[name] states that she started xultophy yesterday- took 24 units both yesterday and today. She is writing down time, blood sugars and carbs. She says she is eating less than her usual carbs (because she is afraid of high blood sugars) and does not want to have to do this for too long. I reminded her that the dose of Xultophy is about half of the dose of Victoza she was taking before. I asked her to keep records and call back Thursday afternoon to give [place] her  blood sugars or she can upload her CGM.  Stopped victoza and Novolog on 11-28-19 ~ 130 PM took 24 units of Xultophy 142pm/ 252/48 carbs 657 PM/238/40 carbs 1135 pm/234/45 carbs  11-29-19 227 am/217 913 am/ 260 1025 am/ 271/ 18 carbs 10 am took 24 units Xultophy 158 pm/ 274/ 53 grams carbs

## 2019-11-30 ENCOUNTER — Telehealth: Payer: Self-pay

## 2019-11-30 MED ORDER — XULTOPHY 100-3.6 UNIT-MG/ML ~~LOC~~ SOPN: 27.0000 [IU] | PEN_INJECTOR | Freq: Every day | SUBCUTANEOUS | 2 refills | Status: DC

## 2019-11-30 NOTE — Telephone Encounter (Signed)
Patient informed to increase her dose of xultophy to 27 units. Blood sugar 215 this am..

## 2019-11-30 NOTE — Telephone Encounter (Signed)
Pls contact pt regarding medicine issues [phone]

## 2019-11-30 NOTE — Telephone Encounter (Signed)
Returned call to patient. States she received duloxetine 60 mg from [facility] but it only had 30 tabs. Spoke with Jeani Hawking at [facility]. States they filled a Rx from July instead of the one sent on 11/02/2019 which was for 60 mg BID. They will fill the new Rx now and insurance will pay for it on 12/02/2019. Patient notified. Hubbard Hartshorn, BSN, RN-BC

## 2019-11-30 NOTE — Addendum Note (Signed)
Addended by: Dorian Pod A on: 11/30/2019 04:44 PM   Modules accepted: Orders

## 2019-12-02 ENCOUNTER — Other Ambulatory Visit: Payer: Self-pay | Admitting: Internal Medicine

## 2019-12-02 DIAGNOSIS — Z1231 Encounter for screening mammogram for malignant neoplasm of breast: Secondary | ICD-10-CM

## 2019-12-05 ENCOUNTER — Telehealth: Payer: Self-pay | Admitting: Dietician

## 2019-12-05 DIAGNOSIS — Z794 Long term (current) use of insulin: Secondary | ICD-10-CM

## 2019-12-05 DIAGNOSIS — E1140 Type 2 diabetes mellitus with diabetic neuropathy, unspecified: Secondary | ICD-10-CM

## 2019-12-05 NOTE — Telephone Encounter (Signed)
[name] calls asking "Do I have wait until Thursday to have my insulin adjusted? "  She reports the following blood sugars with times done and carbs eaten for the past 4 days.    10/21- 27 units xultophy  887 am-189  1214 pm-240, 28 carbs  213 pm-254-23 carbs  740 pm-211- 32 carbs  10/22-27 units Xultophy 1252 am 269  954 am -201 1125 am -243-55 carbs 422 pm-253-21 carbs  11 pm-184-53 carbs  10/23-27 units Xultophy 835 am-130  1234 pm-243, 41 carbs  516 pm-132- 45 carbs   10/24- 27 units Xultophy  145 am-158, 53 carbs 4 am-282- 52 carbs  1030 am-229 1215 pm 227, 50 carbs 411 pm-235, 26 carbs  550 pm-301, 18 carbs  944 pm-251

## 2019-12-05 NOTE — Telephone Encounter (Signed)
Increase xultophy to 30 units

## 2019-12-06 ENCOUNTER — Telehealth: Payer: Self-pay | Admitting: *Deleted

## 2019-12-06 MED ORDER — XULTOPHY 100-3.6 UNIT-MG/ML ~~LOC~~ SOPN: 30.0000 [IU] | PEN_INJECTOR | Freq: Every day | SUBCUTANEOUS | 3 refills | Status: DC

## 2019-12-06 NOTE — Telephone Encounter (Signed)
Received fax from [name] with INR results from 07/18/2019 to 12/05/2019. Last INR 1.8 on 12/05/2019. Results placed in Dr. Gladstone Pih box. Hubbard Hartshorn, BSN, RN-BC

## 2019-12-06 NOTE — Telephone Encounter (Signed)
Patient notified to increase her Xultophy to 30 units.

## 2019-12-06 NOTE — Addendum Note (Signed)
Addended by: Dorian Pod A on: 12/06/2019 09:41 AM   Modules accepted: Orders

## 2019-12-07 ENCOUNTER — Other Ambulatory Visit: Payer: Self-pay | Admitting: *Deleted

## 2019-12-07 DIAGNOSIS — F419 Anxiety disorder, unspecified: Secondary | ICD-10-CM

## 2019-12-08 ENCOUNTER — Telehealth: Payer: Self-pay | Admitting: Dietician

## 2019-12-08 ENCOUNTER — Ambulatory Visit (INDEPENDENT_AMBULATORY_CARE_PROVIDER_SITE_OTHER): Payer: HMO | Admitting: Internal Medicine

## 2019-12-08 ENCOUNTER — Encounter: Payer: Self-pay | Admitting: Internal Medicine

## 2019-12-08 VITALS — BP 132/42 | HR 66 | Temp 97.6°F | Ht 62.0 in | Wt 170.6 lb

## 2019-12-08 DIAGNOSIS — S90819A Abrasion, unspecified foot, initial encounter: Secondary | ICD-10-CM

## 2019-12-08 DIAGNOSIS — I5032 Chronic diastolic (congestive) heart failure: Secondary | ICD-10-CM

## 2019-12-08 DIAGNOSIS — M545 Low back pain, unspecified: Secondary | ICD-10-CM

## 2019-12-08 DIAGNOSIS — E1140 Type 2 diabetes mellitus with diabetic neuropathy, unspecified: Secondary | ICD-10-CM | POA: Diagnosis not present

## 2019-12-08 DIAGNOSIS — F321 Major depressive disorder, single episode, moderate: Secondary | ICD-10-CM

## 2019-12-08 DIAGNOSIS — B353 Tinea pedis: Secondary | ICD-10-CM | POA: Diagnosis not present

## 2019-12-08 DIAGNOSIS — I480 Paroxysmal atrial fibrillation: Secondary | ICD-10-CM

## 2019-12-08 DIAGNOSIS — Z794 Long term (current) use of insulin: Secondary | ICD-10-CM

## 2019-12-08 DIAGNOSIS — J449 Chronic obstructive pulmonary disease, unspecified: Secondary | ICD-10-CM

## 2019-12-08 DIAGNOSIS — E854 Organ-limited amyloidosis: Secondary | ICD-10-CM

## 2019-12-08 DIAGNOSIS — M19041 Primary osteoarthritis, right hand: Secondary | ICD-10-CM

## 2019-12-08 DIAGNOSIS — M19042 Primary osteoarthritis, left hand: Secondary | ICD-10-CM

## 2019-12-08 DIAGNOSIS — G8929 Other chronic pain: Secondary | ICD-10-CM | POA: Diagnosis not present

## 2019-12-08 DIAGNOSIS — I43 Cardiomyopathy in diseases classified elsewhere: Secondary | ICD-10-CM

## 2019-12-08 DIAGNOSIS — M797 Fibromyalgia: Secondary | ICD-10-CM

## 2019-12-08 DIAGNOSIS — D509 Iron deficiency anemia, unspecified: Secondary | ICD-10-CM | POA: Diagnosis not present

## 2019-12-08 DIAGNOSIS — I10 Essential (primary) hypertension: Secondary | ICD-10-CM | POA: Diagnosis not present

## 2019-12-08 DIAGNOSIS — I2723 Pulmonary hypertension due to lung diseases and hypoxia: Secondary | ICD-10-CM

## 2019-12-08 LAB — GLUCOSE, CAPILLARY: Glucose-Capillary: 284 mg/dL — ABNORMAL HIGH (ref 70–99)

## 2019-12-08 LAB — POCT GLYCOSYLATED HEMOGLOBIN (HGB A1C): Hemoglobin A1C: 6.9 % — AB (ref 4.0–5.6)

## 2019-12-08 NOTE — Telephone Encounter (Signed)
Good advice, and I'm glad you spoke to her about our new counselor.  That was on my agenda today and unfortunately I didn't get to it - there are always so many problems to cover! Thank you Butch Penny.

## 2019-12-08 NOTE — Patient Instructions (Signed)
Ms. Brinson, I was glad to see you today.  We discussed many things, including diabetes (you are increasing the Xultophy to 33 units starting today and we'll continue to adjust; your A1C was done today as well), your R lower back pain worse after a fall a few months ago (xray ordered and you will consider PT, though transportation could be a problem), you have a heart scan tomorrow, and you are waiting to hear back from your heart doctor before your endoscopies can be scheduled to evaluate your anemia.  We are checking your blood count today to see if your body is responding to the iron infusions.    I'll send a message to you in MyChart about results from your tests, as well as info about the prescription I'll order for your foot condition.  I'll send that to [facility].  You should also be able to pick up your pain medicine prescription there on 12/17/19.  Take care and stay well and I'll see you next month.  Dr. [name]

## 2019-12-08 NOTE — Telephone Encounter (Signed)
[name] calls to ask what she should do if she is unsure if she took her Xultophy. She also mentions that she lost balance when changing clothes and fell. Caught herself and is not hurt.  suggested she not take  Any Xultophy until tomorrow (CBG per CGM is 227) and start her new dose of Xultophy tomorrow.  She agreed. She states that she is interested in speaking with our new counselor once she begins seeing patients.

## 2019-12-08 NOTE — Progress Notes (Signed)
Established Patient Office Visit  Subjective:  Patient ID: [name], female    DOB: September 29, 1950  Age: 69 y.o. MRN: [id number]  CC: "I feel horrible"  HPI [name] presents for close (monthly, preferably) f/u of ongoing issues including Fe-deficiency anemia (completed 2nd Fe infusion on 11/18/19) for which EGD and colonoscopy are planned this fall, referral already completed), DM2 (at last visit stopped prandial insulin and changed to [place] with ongoing adjustments to address persistent hyperglycemia); excoriated heels from an aggressive pedicure; worsening abdominal wall hernia pain for which she would like to postpone any surgical opinion due to fear of anesthesia; depression and anxiety; epigastric pain for which her PPI was increased from daily to twice daily in past several weeks; severe vitamin D deficiency for which high dose supplement was initiated but not yet started at last visit; and probable cardiac amyloidosis being worked up by cardiology, f/u scan tomorrow and cardiology appt in 12/2019.Marland Kitchen  Her symptomatic multimorbidity and negative impact on mobility and QOL have been overwhelming.    Today she reports that she's feeling horrible due to the mobility limitations caused by her persistent back pain. Pain is limiting her morning mobility - walking to kitchen to get coffee causes pain; she often just goes back to bed after using the bathroom (rather than going to the kitchen) because it's a shorter distance.  She inquires about patch analgesia for back pain,  for which her opioid has never provided benefit in this area (it is helpful for her other pains).  She has used OTC topical icy hot with lidocaine and has used OTC lidocaine patches with no benefit.  She has not had spinal injections and was interested in hearing more.  She had a fall several months ago, backwards onto her posterior which has caused increase in chronic symptoms.  She cannot stand up  straight.  Can't sit upright.  The pain is located to the R of her low back.  GI - planning for upper/lower endo during hospitalization.  They are waiting for Dr. Darci Current input before scheduling.    Cardiology - f/u scan for amyloidosis work up is tomorrow per patient.  Plan thereafter is pending. She has no symptoms of dyspnea, orthopnea, or LE edema.   Feet - skin remains sore, and there are healing lesions on her posterior heels, following her aggressive scrubbing at a pedicurist prior to last appt.  SHe is concerned.    Past Medical History:  Diagnosis Date  . Anxiety 07/24/2010  . Aortic atherosclerosis ([place]) 10/19/2014   Seen on CT scan, currently asymptomatic  . 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 Arthritis    "lower back; hands" (02/19/2018)  . Asymptomatic cholelithiasis 09/25/2015   Seen on CT scan 08/2015  . Carotid artery stenosis; s/p R endarterectomy    s/p right endarterectomy (06/2010) Carotid US (07/2010):  Left: Moderate-to-severe (60-79%) calcific and non-calcific plaque origin and proximal ICA and ECA   . Chronic congestive heart failure with left ventricular diastolic dysfunction ([place]) 10/21/2010  . Chronic constipation 02/03/2011  . Chronic daily headache 01/16/2014  . Chronic iron deficiency anemia   . Chronic low back pain 10/06/2012  . Chronic venous insufficiency 08/04/2012  . COPD (chronic obstructive pulmonary disease) with emphysema (HCC)    PFTs 2018: severe obstructive disease, insignif response to bronchiodilator, mild restriction parenchymal pattern, moderately severe diffusion defect. 2014  FEV1 0.92 (40%), ratio 69, 27%  increase in FEV1 with BD, TLC 91%, severe airtrapping, DLCO49% On chronic home O2. Pulmonary rehab referral 05/2012   . Fibromyalgia 08/29/2010  . Gastroesophageal reflux disease   . History of blood transfusion    "several times"  (02/19/2018)  .  History of clear cell renal cell carcinoma ([place]), in remission 07/21/2011   s/p cryoablation of left RCC in 09/2011 by [name]. Followed by [name]  [facility]) .    Marland Kitchen History of hiatal hernia   . History of mitral valve replacement with bioprosthetic valve due to mitral stenosis 2012   s/p MVR with a 27-mm pericardial porcine valve (Medtronic Mosaic valve, serial #[id number] on 09/20/10, Dr. Prescott Gum)   . History of obstructive sleep apnea, resolved 2013   resolved per sleep study 07/2019; no apnea, but did have desaturation.  CPAP no longer necessary.  Nocturnal polysomnography (06/2009): Moderate sleep apnea/ hypopnea syndrome , AHI 17.8 per hour with nonpositional hypopneas. CPAP titration to 12 CWP, AHI 2.4 per hour. On nocturnal CPAP via a small resMed Quattro full-face mask with heated humidifier.   Marland Kitchen History of pneumonia    "once"  (02/19/2018)  . History of seborrheic keratosis 09/28/2015  . Hyperlipidemia LDL goal < 100 11/20/2005  . Internal and external hemorrhoids without complication 05/21/3011  . Lichen sclerosus of female genitalia 01/12/2017  . Migraine    "none in years" (02/19/2018)  . Moderately severe major depression ([place]) 11/19/2005  . Nocturnal hypoxia per sleep study 07/2019   . Osteoporosis    DEXA 2016: T -2.7; DEXA (12/09/2011): L-spine T -3.7, left hip T -1.4 DEXA (12/2004): L-spine T -2.6, left hip -0.1   . Paroxysmal atrial fibrillation ([place]) 10/22/2010   s/p Left atrial maze procedure for paroxysmal atrial fibrillation on 09/20/2010 by Dr Prescott Gum.  Subsequent splenic infarct, decision was made to re-anticoagulate with coumadin, likely life-long as this is the most likely cause of the splenic infarct.   . Personal history of colonic polyps 05/14/2011   Colonoscopy (05/2011): 4 mm adenomatous polyp excised endoscopically Colonoscopy (02/2002): Adenomatous polyp excised endoscopically   . Pulmonary hypertension due to chronic obstructive pulmonary disease  ([place]) 04/25/2016   2014 TEE w PA peak pressure 46 mmHg, s/p MV replacement   . Right nephrolithiasis, asymptomatic, incidental finding 09/06/2014   5 mm non-obstructing calculus seen on CT scan 09/05/2014   . Severe obesity (BMI 35.0-39.9) with comorbidity ([place]) 10/23/2011  . Tobacco abuse 07/28/2012  . Type 2 diabetes mellitus with diabetic neuropathy (HCC)       Current Outpatient Medications:  .  albuterol (PROAIR HFA) 108 (90 Base) MCG/ACT inhaler, Inhale 2 puffs into the lungs every 6 (six) hours as needed for shortness of breath., Disp: 18 g, Rfl: 5 .  ALPRAZolam (XANAX) 1 MG tablet, TAKE 1 TABLET BY MOUTH EVERY NIGHT AT BEDTIME AS NEEDED FOR INSOMNIA. MAY TAKE 1/2 TABLET BY MOUTH DURING THE DAY FOR ANXIETY, Disp: 55 tablet, Rfl: 4 .  benzonatate (TESSALON) 100 MG capsule, Take 1 capsule (100 mg total) by mouth 3 (three) times daily as needed for cough., Disp: 90 capsule, Rfl: 11 .  Blood Glucose Monitoring Suppl (ONETOUCH VERIO) W/DEVICE KIT, 1 each by Does not apply route 4 (four) times daily., Disp: 1 kit, Rfl: 0 .  buPROPion (WELLBUTRIN) 75 MG tablet, Take 1 tablet (75 mg total) by mouth 2 (two) times daily., Disp: 180 tablet, Rfl: 3 .  chlorpheniramine (CHLOR-TRIMETON) 4 MG tablet, Take 4 mg by mouth daily  as needed for allergies., Disp: , Rfl:  .  Cholecalciferol (VITAMIN D3) 1.25 MG (50000 UT) TABS, Take 1 tab once a week for 4 weeks, then take 1 tab once a month for 5 months then stop., Disp: 10 tablet, Rfl: 0 .  clobetasol cream (TEMOVATE) 1.22 %, Apply 1 application topically 2 (two) times daily as needed (lichen sclerosus flare)., Disp: 45 g, Rfl: 3 .  Continuous Blood Gluc Sensor (FREESTYLE LIBRE 2 SENSOR) MISC, 1 each by Does not apply route 6 (six) times daily. To read 6 times daily with change of sensor every 2 weeks, 90 days worth with 3 refills, Disp: 7 each, Rfl: 3 .  DULoxetine (CYMBALTA) 60 MG capsule, Take 1 capsule (60 mg total) by mouth 2 (two) times daily., Disp: 60  capsule, Rfl: 0 .  fluticasone (FLONASE) 50 MCG/ACT nasal spray, INSTILL 2 SPRAYS INTO BOTH NOSTRILS DAILY, Disp: 54 g, Rfl: 3 .  Fluticasone-Salmeterol (ADVAIR) 500-50 MCG/DOSE AEPB, INHALE 1 PUFF INTO THE LUNGS TWICE DAILY, Disp: 180 each, Rfl: 3 .  furosemide (LASIX) 80 MG tablet, TAKE 1 TABLET BY MOUTH EVERY MORNING AND TAKE 1/2 TABLET EVERY EVENING, Disp: 45 tablet, Rfl: 11 .  gabapentin (NEURONTIN) 300 MG capsule, Take 2 capsules (600 mg total) by mouth 3 (three) times daily., Disp: 540 capsule, Rfl: 3 .  glucose blood (ONETOUCH VERIO) test strip, Use to check blood sugar 4 times daily. diag code E11.40. Insulin dependent, Disp: 375 each, Rfl: 3 .  HYDROcodone-acetaminophen (NORCO/VICODIN) 5-325 MG tablet, Take 1-2 tablets by mouth every 6 (six) hours as needed for severe pain., Disp: 114 tablet, Rfl: 0 .  hydrocortisone cream 1 %, APPLY EXTERNALLY TO THE AFFECTED AREA TWICE DAILY, Disp: 28.35 g, Rfl: 2 .  Insulin Degludec-Liraglutide (XULTOPHY) 100-3.6 UNIT-MG/ML SOPN, Inject 30 Units into the skin daily for 30 doses., Disp: 27 mL, Rfl: 3 .  Insulin Pen Needle 32G X 4 MM MISC, Use to inject insulin up to 6 times a day, Disp: 500 each, Rfl: 3 .  Insulin Syringe-Needle U-100 31G X 15/64" 0.5 ML MISC, Use to inject insulin up to 4 times a day, Disp: 400 each, Rfl: 3 .  ipratropium-albuterol (DUONEB) 0.5-2.5 (3) MG/3ML SOLN, INHALE THE CONTENTS OF 1 VIAL VIA NEBULIZER EVERY 6 HOURS AS NEEDED FOR SHORTNESS OF BREATH, Disp: 360 mL, Rfl: 3 .  Lancets Misc. (ACCU-CHEK FASTCLIX LANCET) KIT, Check your blood 4 times a day dx code 250.00 insulin requiring, Disp: 1 kit, Rfl: 2 .  metoprolol succinate (TOPROL-XL) 50 MG 24 hr tablet, Take 1 tablet (50 mg total) by mouth daily. Take with or immediately following a meal., Disp: 90 tablet, Rfl: 3 .  nystatin (NYSTATIN) powder, Apply 1 application topically 3 (three) times daily., Disp: 60 g, Rfl: 0 .  omeprazole (PRILOSEC) 40 MG capsule, Take 1 capsule (40 mg  total) by mouth in the morning and at bedtime., Disp: 30 capsule, Rfl: 1 .  OXYGEN, Inhale 2 L into the lungs at bedtime., Disp: , Rfl:  .  potassium chloride SA (KLOR-CON) 20 MEQ tablet, Take 2 tabs by mouth every morning and 1 tab every evening to replace potassium lost from lasix use., Disp: 270 tablet, Rfl: 3 .  PRESCRIPTION MEDICATION, Inhale into the lungs at bedtime. CPAP , Disp: , Rfl:  .  rosuvastatin (CRESTOR) 20 MG tablet, TAKE 1 TABLET BY MOUTH ONCE EVERY NIGHT AT BEDTIME, Disp: 90 tablet, Rfl: 3 .  SPIRIVA RESPIMAT 2.5 MCG/ACT AERS, INHALE 2 PUFFS  BY MOUTH EVERY DAY, Disp: 4 g, Rfl: 11 .  warfarin (COUMADIN) 2.5 MG tablet, Take 2.43m warfarin on We/Th/Sa/Su; 04mon Mondays; 1.2532m1/2 x 2.5mg75mblet) on Tu/Fri., Disp: 20 tablet, Rfl: 1  ROS:  Pertinents noted in HPI.    Objective:    Physical Exam  There were no vitals taken for this visit. Wt Readings from Last 3 Encounters:  11/17/19 168 lb 1.6 oz (76.2 kg)  10/13/19 171 lb 4.8 oz (77.7 kg)  09/27/19 167 lb (75.8 kg)   Feet with scale. and small dried scab, one each on posterior heels.  Underlying skin is very red and cracked in a moccasin distribution.  Toes are purple, baseline.  No LE edema.  Lungs with few rales posterior bases. NO JVD sitting position.  Heart RRR with questionable mid systolic extra sound.  STanding stance is flexed at hips, lumbar lordosis is absent, she leans involuntarily toward L.  No point tenderness over spine or paraspinal muscles.  Tenderness is in area of R SI joint.  Walking is very uncomfortable with impaired stability due to her back pain.   Assessment & Plan:   Back pain and impaired mobility is her greatest concern at this time. L spine film, lidoderm patch.  New dx of tinea pedis is a risk factor for cellulitis and other soft tissue infection in diabetic foot.  Treat with terbinafine.  CBC today to f/u reponse to her iron infusion.  A1c today.  Ongoing w/u for amyloidosis, endoscopies  pending.  Problem List Items Addressed This Visit    None     See problem list for details.   Follow-up:  43M   JuliAngelica Pou

## 2019-12-09 ENCOUNTER — Encounter (HOSPITAL_COMMUNITY)
Admission: RE | Admit: 2019-12-09 | Discharge: 2019-12-09 | Disposition: A | Discharge status: Home / Self Care | Payer: HMO | Source: Ambulatory Visit | Attending: Cardiology | Admitting: Cardiology

## 2019-12-09 ENCOUNTER — Encounter: Payer: Self-pay | Admitting: Internal Medicine

## 2019-12-09 DIAGNOSIS — E859 Amyloidosis, unspecified: Secondary | ICD-10-CM | POA: Diagnosis not present

## 2019-12-09 DIAGNOSIS — I509 Heart failure, unspecified: Secondary | ICD-10-CM | POA: Diagnosis not present

## 2019-12-09 LAB — CBC
Hematocrit: 35.4 % (ref 34.0–46.6)
Hemoglobin: 11.3 g/dL (ref 11.1–15.9)
MCH: 28.3 pg (ref 26.6–33.0)
MCHC: 31.9 g/dL (ref 31.5–35.7)
MCV: 89 fL (ref 79–97)
Platelets: 203 10*3/uL (ref 150–450)
RBC: 4 x10E6/uL (ref 3.77–5.28)
RDW: 23.8 % — ABNORMAL HIGH (ref 11.7–15.4)
WBC: 6.8 10*3/uL (ref 3.4–10.8)

## 2019-12-09 MED ORDER — TECHNETIUM TC 99M PYROPHOSPHATE
21.4000 | Freq: Once | INTRAVENOUS | Status: AC | PRN
  Administered 2019-12-09: 21.4 via INTRAVENOUS
  Filled 2019-12-09: qty 22

## 2019-12-09 MED ORDER — XULTOPHY 100-3.6 UNIT-MG/ML ~~LOC~~ SOPN: 33.0000 [IU] | PEN_INJECTOR | Freq: Every day | SUBCUTANEOUS | 3 refills | Status: DC

## 2019-12-09 MED ORDER — HYDROCODONE-ACETAMINOPHEN 5-325 MG PO TABS: 1.0000 | ORAL_TABLET | Freq: Four times a day (QID) | ORAL | 0 refills | Status: DC | PRN

## 2019-12-09 MED ORDER — DULOXETINE HCL 60 MG PO CPEP: 60.0000 mg | ORAL_CAPSULE | Freq: Two times a day (BID) | ORAL | 3 refills | Status: DC

## 2019-12-09 MED ORDER — TERBINAFINE HCL 1 % EX CREA: TOPICAL_CREAM | CUTANEOUS | 1 refills | Status: DC

## 2019-12-09 MED ORDER — LIDOCAINE 5 % EX PTCH: 1.0000 | MEDICATED_PATCH | Freq: Two times a day (BID) | CUTANEOUS | 2 refills | Status: DC

## 2019-12-09 NOTE — Assessment & Plan Note (Signed)
Has completed 2nd of 2 doses Fe infusion.  CBC today. Upper/lower endo to be scheduled when cardiology concerns have been addressed.

## 2019-12-09 NOTE — Assessment & Plan Note (Signed)
Mood is better than at last visit.  She is interested in meeting our new mental health therapist. Continue current regimen.  Basis for her depressed mood is declining health and function.

## 2019-12-09 NOTE — Assessment & Plan Note (Signed)
No associated JVD or LE edema at this time.

## 2019-12-09 NOTE — Assessment & Plan Note (Signed)
No cardiac symptoms (heart failure) at this time, though her significant peripheral polyneuropathy may in part be due to amyloidosis (she also has DM2 complicated by neuropathy).  She has f/u scan and appt with cardiologist in next few weeks.

## 2019-12-09 NOTE — Assessment & Plan Note (Signed)
Pulse is regular on auscultation. Rate is normal.

## 2019-12-09 NOTE — Assessment & Plan Note (Signed)
Also in differential is polyneuropathy - today she describes the pain as burning.  Joints not tender on exam.

## 2019-12-09 NOTE — Assessment & Plan Note (Signed)
Pain has shifted to the R, posture is now abnormal with muscle spasticity guarding.  Given her hx of osteoporosis and falling on her bum with increase in pain, will image with plain film.  PT would be very helpful and she is willing to consider it, but transportation is a barrier.  Will prescribe lidoderm patches.  She has tried OTC strength lidocaine w/o relief.  Her opioid does not treat her pain.

## 2019-12-09 NOTE — Assessment & Plan Note (Signed)
Controlled on current regimen.  MOnitor.

## 2019-12-09 NOTE — Assessment & Plan Note (Signed)
Compensated on current regimen.  Monitor.

## 2019-12-09 NOTE — Assessment & Plan Note (Signed)
A couple of scabs remain on bilateral posterior heels.  Now apparent is extensive tinea pedis is moccasin distribution.  Skin is erythematous an sensitive, not tender, and not warm.  She will not return to the pedicurist.

## 2019-12-09 NOTE — Assessment & Plan Note (Signed)
Currently on Xultophy 30 units and adjusting up for ongoing hyperglycemia. A1C today.  Goal is to simplify her regimen to minimize stress during this period of concurrent multimorbidity.  Her AGP shows no lows, 25% in range, otherwise glucose is high/very high.

## 2019-12-09 NOTE — Assessment & Plan Note (Signed)
>>  ASSESSMENT AND PLAN FOR CHRONIC CHF (CONGESTIVE HEART FAILURE) (HCC) WRITTEN ON 12/09/2019  3:29 PM BY Miguel Aschoff, MD  Compensated on current regimen.  Monitor.

## 2019-12-12 ENCOUNTER — Telehealth: Payer: Self-pay | Admitting: Pharmacist

## 2019-12-12 ENCOUNTER — Telehealth: Payer: Self-pay | Admitting: *Deleted

## 2019-12-12 ENCOUNTER — Telehealth (HOSPITAL_COMMUNITY): Payer: Self-pay

## 2019-12-12 NOTE — Telephone Encounter (Signed)
-----  Message from [name], MD sent at 12/10/2019 11:14 AM EDT ----- This is not suggestive of TTR cardiac amyloidosis.

## 2019-12-12 NOTE — Telephone Encounter (Signed)
   Malena Edman, RN  12/12/2019 4:19 PM EDT Back to Top    Patient made aware and verbalized understanding

## 2019-12-12 NOTE — Telephone Encounter (Signed)
Provided instructions (same as last week, 1 x 2.58m green-colored warfarin tablet, by mouth, dailly--EXCEPT on Monday, DO NOT TAKE ANY. Repeat INR in 1 week. Does not endorse any evidences of bleeding or embolic disease. INR goal 1.5 - 2.5 as established by previous PCP. Patient self-tests with fingerstick point of care INR device and then provides me results as well as uploading to the [employer] Adequate warfarin remaining.

## 2019-12-12 NOTE — Telephone Encounter (Signed)
Thank you, agree.

## 2019-12-12 NOTE — Telephone Encounter (Addendum)
Information was sent through CoverMyMeds for PA for Lidocaine Patches,  Awaiting determination.  Sander Nephew, RN 02/11/2019. 2:45 PM.  Fax from [facility] for Lidocaine 5% Patches was approved 12/12/2019 through 12/11/2020.  Sander Nephew, RN 11.4.2021 3:09 PM.

## 2019-12-15 MED ORDER — ALPRAZOLAM 1 MG PO TABS: ORAL_TABLET | ORAL | 4 refills | Status: DC

## 2019-12-15 NOTE — Addendum Note (Signed)
Addended by: Dorian Pod A on: 12/15/2019 03:37 PM   Modules accepted: Orders

## 2019-12-19 ENCOUNTER — Telehealth (HOSPITAL_COMMUNITY): Payer: Self-pay | Admitting: *Deleted

## 2019-12-19 DIAGNOSIS — Z7901 Long term (current) use of anticoagulants: Secondary | ICD-10-CM | POA: Diagnosis not present

## 2019-12-19 DIAGNOSIS — I480 Paroxysmal atrial fibrillation: Secondary | ICD-10-CM | POA: Diagnosis not present

## 2019-12-19 NOTE — Telephone Encounter (Signed)
Received fax from [facility], pt needs clearance to have colonoscopy and to hold warfarin  Per Dr Aundra Dubin: Madaline Brilliant for Colonoscopy. Ok to hold warfarin 4-5 days"  Note faxed back to them at [phone]

## 2019-12-23 ENCOUNTER — Telehealth: Payer: Self-pay | Admitting: Dietician

## 2019-12-23 NOTE — Telephone Encounter (Signed)
Patient calls about alarm going off for blood sugar being about 80-90.  She uploaded and we reviewed her blood sugars remotely. Her lowest blood sugars have been [phone] with one episode this morning that Continuous glucose monitor shows <70 mg/dl but patient says she was eating at that time and had no symptoms. She changed her low alarm to 80. Overall her blood sugar are improving and mostly in target. she was encouraged to continue her good self care.

## 2019-12-23 NOTE — Telephone Encounter (Signed)
Thank you for the update

## 2019-12-26 ENCOUNTER — Other Ambulatory Visit: Payer: Self-pay

## 2019-12-26 ENCOUNTER — Telehealth: Payer: Self-pay

## 2019-12-26 ENCOUNTER — Telehealth: Payer: Self-pay | Admitting: *Deleted

## 2019-12-26 NOTE — Telephone Encounter (Signed)
Patient called back to report she is doing better and is now laughing during conversation. Rates pain in feet at 7/10 at present. States she has an appt on 12/29/2019 with cardiology for PAD. Hubbard Hartshorn, BSN, RN-BC

## 2019-12-26 NOTE — Telephone Encounter (Signed)
Patient provided FS POC PST INR 1.9 (target 1.5 - 2.5) on 13.64m warfarin/wk as: Su-2.583m-0mgT-1.25mgW-2.5mgTh-2.5mgF-2.5mgSa-2.5mg. CONTINUE this regimen. Repeat INR on 22-NOV-21 as PST FS POC. No bleeding she states.

## 2019-12-26 NOTE — Patient Outreach (Signed)
  [facility]) Care Management Chronic Special Needs Program    12/26/2019  Name: [name], DOB: [birth year]  MRN: [id number]   Ms. Aleathia Purdy is enrolled in a chronic special needs plan for Heart Failure. Telephone call to client for CSNP assessment update. Unable to reach. HIPAA compliant voice message left with call back phone number and return call request.   Plan; RNCM will attempt 2nd telephone call to client within 2 weeks.   Quinn Plowman RN,BSN,CCM [facility] Management [phone]

## 2019-12-26 NOTE — Telephone Encounter (Signed)
Patient called in stating, "My feet are in so much pain I can't stand on them."  States she woke up 10 minutes ago with this pain, rating 12/10. Patient had taken AM meds including gabapentin this morning, then took a nap and woke up with this pain. She will take a hydrocodone tab now and call back in one hour to let us know if pain has decreased. States PCP is already aware of this pain but it hasn't been this severe before. Patient denies injury to feet. Hubbard Hartshorn, BSN, RN-BC

## 2019-12-27 ENCOUNTER — Other Ambulatory Visit: Payer: Self-pay

## 2019-12-27 NOTE — Patient Outreach (Signed)
[facility]) Care Management Chronic Special Needs Program  12/27/2019  Name: Sarabi Sockwell DOB: 25-Oct-1950  MRN: [id number]  Ms. Ily Denno is enrolled in a chronic special needs plan for Heart Failure. Telephone call to client for CSNP assessment follow up. HIPAA verified. Client reports having 3-5 falls without injury within the past 6 months. Client states she has notified her doctor of falls. Reports Physical therapy has been discussed. Client states she does not use her walker consistently. Client states she has back and feet pain. Client states she has neuropathy in her feet.  She reports her provider prescribed her lidoderm patches.Client reports having a follow up appointment with her cardiologist on 12/29/19. Client states she does not weigh daily. She reports losing approximately 33 lbs in the past year and a half. Client reports being pleased with this.       Goals Addressed              This Visit's Progress   .  COMPLETED: Blood Pressure < 130/80        Per chart review clients recent blood pressure readings in past 2 months:  132/42 and 119/49.     Marland Kitchen  COMPLETED: Client understands the importance of follow-up with providers by attending scheduled visits        Primary care provider visit completed 06/30/19 and 12/08/19. Client reports next follow up with her primary care provider is 01/12/20.  Cardiology visit completed 05/23/19 and 09/27/19     .  Client will report no fall or injuries in the next 3 months.   Not on track     Goal not met.  Client reports having 3-5 falls within the past 3 months  without injury.   Continue to follow below recommendations.  Continue to use ambulatory device( cane or walker) as advised by your provider Inside your home: Use plenty of lighting, keep walkways clear of clutter and remove tripping hazards such as cords or throw rugs.  Be aware of small pets when walking.  If you feel dizzy, sit still in a chair or  on the bed when waking up. Don't continue to walk around until dizziness resolves.   Discuss falls with provider at follow up appointment.      .  client will report weighing herself daily for heart failure management in 6 months.        Reviewed signs/ symptoms of heart failure with client. Review [employer] calendar sent in the mail for heart failure information It is important to weigh daily and write down your weights. Pay attention to your body if you have any shortness of breath, swelling in your feet, ankles, legs or your waistband gets tight.  Report these symptoms to your doctor.  Contact to take your medications as prescribed.  Follow a low salt meal plan. RN case manager will send client education article for low salt diet.     .  Client will verbalize knowledge of chronic lung disease as evidenced by no ED visits or Inpatient stays related to chronic lung disease    On track     Client most recent heart failure clinic visit 09/26/19 Client denies ED or inpatient stays related to chronic lung disease/ Review  [employer] calendar sent in the mail for COPD action plan and information Continue to take your medications as prescribed.  Continue regular follow up visits with your providers.     .  Client will verbalize knowledge of  self management of Hypertension as evidences by BP reading of 140/90 or less; or as defined by provider   On track     Client reports having blood pressure monitor for home use.  Plan to check your blood pressure at least 1 times per week and record readings. Continue on your low salt diet.  Continue to take your blood pressure medications as recommended.      .  Client/Caregiver will verbalize understanding of instructions related to self-care and safety   On track     Client reports having 3-5 falls within the past 3 months without injury.   Continue to use ambulatory device( cane or walker) as advised by your provider Inside your  home: Use plenty of lighting, keep walkways clear of clutter and remove tripping hazards such as cords or throw rugs.  Be aware of small pets when walking.  If you feel dizzy, sit still in a chair or on the bed when waking up. Don't continue to walk around until dizziness resolves.  Notify your doctor/ doctor's office of falls.     .  COMPLETED: Decrease the use of hospital emergency department related to heart failure within the next year        Client denies use of hospital emergency room within the past year.     .  COMPLETED: Maintain timely refills of Heart Failure medication as prescribed within the year         Client reports maintaining timely refills of heart failure medication.       .  COMPLETED: Obtain annual  Lipid Profile, LDL-C        Lipid profile completed 11/05/18- revealed  all components meeting target except HDL slightly low at  38- discussed strategies to raise HDL    .  COMPLETED: Obtain annual  Lipid Profile, LDL-C        Lipid profile completed 09/26/19      .  Patient Stated- "improve my back pain" (pt-stated)   On track     Client continues to report back pain at baseline on current treatment.  Continue to take prescribed back pain medication as  recommended by your doctor.  Contact your doctor for worsening back pain.  Continue to use lidoderm patches as prescribed by your doctor Report increase / worsening symptoms in back and / or feet    .  COMPLETED: Visit Primary Care Provider or Cardiologist at least 2 times per year        Primary care provider visit completed 06/30/19 and 12/08/19 Cardiology visit completed 05/23/19 and 09/27/19       Plan:  Send successful outreach letter with a copy of their individualized care plan and Send individual care plan to provider  Chronic care management coordinator will outreach in:  6 months    Quinn Plowman RN,BSN,CCM [place]  Management [phone]    .

## 2019-12-29 ENCOUNTER — Other Ambulatory Visit: Payer: Self-pay | Admitting: Student in an Organized Health Care Education/Training Program

## 2019-12-29 ENCOUNTER — Other Ambulatory Visit: Payer: Self-pay

## 2019-12-29 ENCOUNTER — Encounter (HOSPITAL_COMMUNITY): Payer: Self-pay | Admitting: Cardiology

## 2019-12-29 ENCOUNTER — Ambulatory Visit (HOSPITAL_COMMUNITY)
Admission: RE | Admit: 2019-12-29 | Discharge: 2019-12-29 | Disposition: A | Discharge status: Home / Self Care | Payer: HMO | Source: Ambulatory Visit | Attending: Cardiology | Admitting: Cardiology

## 2019-12-29 VITALS — BP 142/60 | HR 65 | Wt 165.6 lb

## 2019-12-29 DIAGNOSIS — J439 Emphysema, unspecified: Secondary | ICD-10-CM

## 2019-12-29 DIAGNOSIS — G4733 Obstructive sleep apnea (adult) (pediatric): Secondary | ICD-10-CM | POA: Diagnosis not present

## 2019-12-29 DIAGNOSIS — I2510 Atherosclerotic heart disease of native coronary artery without angina pectoris: Secondary | ICD-10-CM | POA: Insufficient documentation

## 2019-12-29 DIAGNOSIS — R0789 Other chest pain: Secondary | ICD-10-CM | POA: Diagnosis not present

## 2019-12-29 DIAGNOSIS — Z79899 Other long term (current) drug therapy: Secondary | ICD-10-CM | POA: Diagnosis not present

## 2019-12-29 DIAGNOSIS — Z7901 Long term (current) use of anticoagulants: Secondary | ICD-10-CM | POA: Insufficient documentation

## 2019-12-29 DIAGNOSIS — Z794 Long term (current) use of insulin: Secondary | ICD-10-CM | POA: Insufficient documentation

## 2019-12-29 DIAGNOSIS — I480 Paroxysmal atrial fibrillation: Secondary | ICD-10-CM | POA: Diagnosis not present

## 2019-12-29 DIAGNOSIS — J449 Chronic obstructive pulmonary disease, unspecified: Secondary | ICD-10-CM | POA: Insufficient documentation

## 2019-12-29 DIAGNOSIS — R230 Cyanosis: Secondary | ICD-10-CM | POA: Insufficient documentation

## 2019-12-29 DIAGNOSIS — I5032 Chronic diastolic (congestive) heart failure: Secondary | ICD-10-CM | POA: Diagnosis not present

## 2019-12-29 DIAGNOSIS — R0602 Shortness of breath: Secondary | ICD-10-CM | POA: Insufficient documentation

## 2019-12-29 DIAGNOSIS — I739 Peripheral vascular disease, unspecified: Secondary | ICD-10-CM | POA: Diagnosis not present

## 2019-12-29 DIAGNOSIS — E1140 Type 2 diabetes mellitus with diabetic neuropathy, unspecified: Secondary | ICD-10-CM | POA: Diagnosis not present

## 2019-12-29 DIAGNOSIS — I272 Pulmonary hypertension, unspecified: Secondary | ICD-10-CM | POA: Diagnosis not present

## 2019-12-29 DIAGNOSIS — M79673 Pain in unspecified foot: Secondary | ICD-10-CM | POA: Insufficient documentation

## 2019-12-29 LAB — CBC
HCT: 39 % (ref 36.0–46.0)
Hemoglobin: 12.3 g/dL (ref 12.0–15.0)
MCH: 29.2 pg (ref 26.0–34.0)
MCHC: 31.5 g/dL (ref 30.0–36.0)
MCV: 92.6 fL (ref 80.0–100.0)
Platelets: 244 10*3/uL (ref 150–400)
RBC: 4.21 MIL/uL (ref 3.87–5.11)
RDW: 19.5 % — ABNORMAL HIGH (ref 11.5–15.5)
WBC: 8.9 10*3/uL (ref 4.0–10.5)
nRBC: 0 % (ref 0.0–0.2)

## 2019-12-29 LAB — BASIC METABOLIC PANEL
Anion gap: 10 (ref 5–15)
BUN: 17 mg/dL (ref 8–23)
CO2: 28 mmol/L (ref 22–32)
Calcium: 9.5 mg/dL (ref 8.9–10.3)
Chloride: 99 mmol/L (ref 98–111)
Creatinine, Ser: 1.08 mg/dL — ABNORMAL HIGH (ref 0.44–1.00)
GFR, Estimated: 55 mL/min — ABNORMAL LOW (ref >60)
Glucose, Bld: 300 mg/dL — ABNORMAL HIGH (ref 70–99)
Potassium: 4.2 mmol/L (ref 3.5–5.1)
Sodium: 137 mmol/L (ref 135–145)

## 2019-12-29 MED ORDER — EMPAGLIFLOZIN 10 MG PO TABS: 10.0000 mg | ORAL_TABLET | Freq: Every day | ORAL | 3 refills | Status: DC

## 2019-12-29 NOTE — Patient Instructions (Addendum)
START Jardiance 110m (1 tablet) daily  Labs done today, your results will be available in MyChart, we will contact you for abnormal readings.  Your physician has requested that you have an echocardiogram. Echocardiography is a painless test that uses sound waves to create images of your heart. It provides your doctor with information about the size and shape of your heart and how well your heart's chambers and valves are working. This procedure takes approximately one hour. There are no restrictions for this procedure.  Your physician has requested that you have a lower extremity arterial duplex. This test is an ultrasound of the arteries in the legs or arms. It looks at arterial blood flow in the legs and arms. Allow one hour for Lower and Upper Arterial scans. There are no restrictions or special instructions  Your physician recommends that you schedule repeat labs in 10-14 days  Your physician recommends that you schedule a follow-up appointment in: 3 months  If you have any questions or concerns before your next appointment please send uKoreaa message through [facility] call our office at [phone]    TO LEAVE A MESSAGE FOR THE NURSE SELECT OPTION 2, PLEASE LEAVE A MESSAGE INCLUDING: . YOUR NAME . DATE OF BIRTH . CALL BACK NUMBER . REASON FOR CALL**this is important as we prioritize the call backs  YOU WILL RECEIVE A CALL BACK THE SAME DAY AS LONG AS YOU CALL BEFORE 4:00 PM

## 2019-12-30 ENCOUNTER — Telehealth (HOSPITAL_COMMUNITY): Payer: Self-pay | Admitting: *Deleted

## 2019-12-30 ENCOUNTER — Telehealth: Payer: Self-pay | Admitting: Acute Care

## 2019-12-30 NOTE — Progress Notes (Signed)
PCP: Angelica Pou, MD HF Cardiology: Dr. Aundra Dubin  69 y.o. with COPD, mitral stenosis s/p bioprosthetic MV replacement, paroxysmal atrial fibrillation, GI bleeding, and pulmonary hypertension/RV failure presents for cardiology followup.  She was admitted with GI bleeding, thought to be AVMs, in 2/18 and warfarin was stopped.  She was admitted in 3/18 with chest pain, dyspnea, and elevated troponin.  She had not been taking her Lasix regularly.  Coronary angiography showed nonobstructive CAD, suspect demand ischemia. RHC showed elevated right and left heart filling pressures and severe pulmonary hypertension.  V/Q scan showed no chronic PE, and PFTs showed severe obstructive lung disease.  Echo showed severe RV dilation with mildly decreased systolic function.  She was diuresed and eventually discharged to [facility].   She was admitted in 2/19 with anemia, got 2 units PRBCs.    Echo in 5/19 with EF 60-65%, mildly decreased RV systolic function, PASP 49 mmHg, normal bioprosthetic mitral valve. PYP scan (4/21) grade 2, H/CL 1.3 (equivocal).  PYP scan repeated 10/21, grade 1 with H/CL 1.04.   She returns for followup of diastolic CHF.  She has been having more foot pain recently, has at night while in bed at times.  No ulcers.  Her calves hurt bilaterally when she walks around her house.  Generally, not short of breath walking around the house but gets short of breath with longer distances.  No orthopnea/PND. Occasional episodes of atypical chest pain at night, not exertional.  Still smoking 1 ppd.   Labs (3/18): K 4.8 => 3.9, creatinine 1.2 => 1.09, hgb 10.2 => 10.7, BNP 131 Labs (5/18): K 4, creatinine 1.0 Labs (2/19): K 4.5, creatinine 0.94, transferrin saturation 3%.  Labs (3/19): hgb 11.3 Labs (4/19): K 4.1, creatinine 1.0, LDL 36 Labs (1/10): creatinine 0.85 Labs (7/20): hgb 10.7  Labs (10/20): K 4, creatinine 1.13, LDL 48 Labs (11/20): hgb 9.2 Labs (1/21): K 4.2, creatinine 1.28 Labs  (2/21): hgb 10.7 Labs (8/21): LDL 46, HDL 35, K 4.2, creatinine 1.11  ECG (personally reviewed): NSR, 1st degree AVB, right superior axis  PMH: 1. Mitral stenosis: s/p bioprosthetic mitral valve replacement.  2. COPD: Severe by 3/18 PFTs.  She is still smoking.  3. Atrial fibrillation: Paroxysmal. Has been off warfarin since GI bleed in 2/18.  4. OSA: Uses CPAP.  5. Type II diabetes. 6. Pulmonary hypertension/RV failure: Suspect primarily group 3 PH due to underlying lung disease (COPD).  - RHC (3/18) with mean RA 14, PA 77/28 mean 47, mean PCWP 23, CI 2.6, PVR 4.9.  - Echo (3/18): EF 60-65%, severe RV dilation with mildly decreased RV systolic function, PASP 76 mmHg.  Bioprosthetic MV looks ok.  - V/Q scan (3/18): No evidence for chronic PE.  - PFTs (3/18): Severe obstructive airways disease.  - Echo in 5/19 with EF 60-65%, mildly decreased RV systolic function, PASP 49 mmHg, normal bioprosthetic mitral valve.  - PYP scan (10/21) with grade 1, H/CL 1.04 => unlikely TTR amyloidosis.  7. Anemia: Fe deficiency and anemia of chronic disease.   - Suspect GI AVMs as cause of 2/18 bleed . Coumadin stopped.  8. CAD: LHC (3/18) with 30% LM, 40% proximal RCA.  9. Hyperlipidemia 10. PAD: ABIs 3/21 with 0.85 R, 1.03 L. 50-74% right SFA stenosis, 30-49% left SFA stenosis.  11. Carotid stenosis: Carotid dopplers (3/21) with 40-59% BICA.   SH: Lives alone in [place].  Active smoker.  No ETOH.   Family History  Problem Relation Age of Onset  .  Peptic Ulcer Disease Father   . Heart attack Father 50       Died of MI at age 64  . Heart attack Brother 28       Died of MI at age 44  . Obesity Brother   . Pneumonia Mother   . Healthy Sister   . Lupus Daughter   . Obsessive Compulsive Disorder Daughter    ROS: All systems reviewed and negative except as per HPI.   Current Outpatient Medications  Medication Sig Dispense Refill  . albuterol (PROAIR HFA) 108 (90 Base) MCG/ACT inhaler Inhale 2  puffs into the lungs every 6 (six) hours as needed for shortness of breath. 18 g 5  . ALPRAZolam (XANAX) 1 MG tablet TAKE 1 TABLET BY MOUTH EVERY NIGHT AT BEDTIME AS NEEDED FOR INSOMNIA. MAY TAKE 1/2 TABLET BY MOUTH DURING THE DAY FOR ANXIETY 55 tablet 4  . benzonatate (TESSALON) 100 MG capsule Take 1 capsule (100 mg total) by mouth 3 (three) times daily as needed for cough. 90 capsule 11  . Blood Glucose Monitoring Suppl (ONETOUCH VERIO) W/DEVICE KIT 1 each by Does not apply route 4 (four) times daily. 1 kit 0  . buPROPion (WELLBUTRIN) 75 MG tablet Take 1 tablet (75 mg total) by mouth 2 (two) times daily. 180 tablet 3  . chlorpheniramine (CHLOR-TRIMETON) 4 MG tablet Take 4 mg by mouth daily as needed for allergies.    . clobetasol cream (TEMOVATE) 1.61 % Apply 1 application topically 2 (two) times daily as needed (lichen sclerosus flare). 45 g 3  . Continuous Blood Gluc Sensor (FREESTYLE LIBRE 2 SENSOR) MISC 1 each by Does not apply route 6 (six) times daily. To read 6 times daily with change of sensor every 2 weeks, 90 days worth with 3 refills 7 each 3  . DULoxetine (CYMBALTA) 60 MG capsule Take 1 capsule (60 mg total) by mouth 2 (two) times daily. 60 capsule 3  . fluticasone (FLONASE) 50 MCG/ACT nasal spray INSTILL 2 SPRAYS INTO BOTH NOSTRILS DAILY 54 g 3  . Fluticasone-Salmeterol (ADVAIR) 500-50 MCG/DOSE AEPB INHALE 1 PUFF INTO THE LUNGS TWICE DAILY 180 each 3  . furosemide (LASIX) 80 MG tablet TAKE 1 TABLET BY MOUTH EVERY MORNING AND TAKE 1/2 TABLET EVERY EVENING 45 tablet 11  . gabapentin (NEURONTIN) 300 MG capsule Take 2 capsules (600 mg total) by mouth 3 (three) times daily. 540 capsule 3  . glucose blood (ONETOUCH VERIO) test strip Use to check blood sugar 4 times daily. diag code E11.40. Insulin dependent 375 each 3  . HYDROcodone-acetaminophen (NORCO/VICODIN) 5-325 MG tablet Take 1-2 tablets by mouth every 6 (six) hours as needed for severe pain. 114 tablet 0  . hydrocortisone cream 1 %  APPLY EXTERNALLY TO THE AFFECTED AREA TWICE DAILY 28.35 g 2  . Insulin Degludec-Liraglutide (XULTOPHY) 100-3.6 UNIT-MG/ML SOPN Inject 33 Units into the skin daily for 30 doses. 27 mL 3  . Insulin Pen Needle 32G X 4 MM MISC Use to inject insulin up to 6 times a day 500 each 3  . Insulin Syringe-Needle U-100 31G X 15/64" 0.5 ML MISC Use to inject insulin up to 4 times a day 400 each 3  . ipratropium-albuterol (DUONEB) 0.5-2.5 (3) MG/3ML SOLN INHALE THE CONTENTS OF 1 VIAL VIA NEBULIZER EVERY 6 HOURS AS NEEDED FOR SHORTNESS OF BREATH 360 mL 3  . Lancets Misc. (ACCU-CHEK FASTCLIX LANCET) KIT Check your blood 4 times a day dx code 250.00 insulin requiring 1 kit 2  .  lidocaine (LIDODERM) 5 % Place 1 patch onto the skin every 12 (twelve) hours. Remove & Discard patch within 12 hours or as directed by MD 60 patch 2  . metoprolol succinate (TOPROL-XL) 50 MG 24 hr tablet Take 1 tablet (50 mg total) by mouth daily. Take with or immediately following a meal. 90 tablet 3  . nystatin (NYSTATIN) powder Apply 1 application topically 3 (three) times daily. 60 g 0  . omeprazole (PRILOSEC) 40 MG capsule Take 1 capsule (40 mg total) by mouth in the morning and at bedtime. 30 capsule 1  . OXYGEN Inhale 2 L into the lungs at bedtime.    . potassium chloride SA (KLOR-CON) 20 MEQ tablet Take 2 tabs by mouth every morning and 1 tab every evening to replace potassium lost from lasix use. 270 tablet 3  . PRESCRIPTION MEDICATION Inhale into the lungs at bedtime. CPAP     . rosuvastatin (CRESTOR) 20 MG tablet TAKE 1 TABLET BY MOUTH ONCE EVERY NIGHT AT BEDTIME 90 tablet 3  . SPIRIVA RESPIMAT 2.5 MCG/ACT AERS INHALE 2 PUFFS BY MOUTH EVERY DAY 4 g 11  . terbinafine (LAMISIL) 1 % cream Apply cream to clean and dry feet twice a day until gone.  Apply to toes, and to sides, heels, and soles of feet. 30 g 1  . warfarin (COUMADIN) 2.5 MG tablet Take 2.69m warfarin on We/Th/Sa/Su; 044mon Mondays; 1.2534m1/2 x 2.5mg69mblet) on Tu/Fri. 20  tablet 1  . empagliflozin (JARDIANCE) 10 MG TABS tablet Take 1 tablet (10 mg total) by mouth daily before breakfast. 30 tablet 3   Current Facility-Administered Medications  Medication Dose Route Frequency Provider Last Rate Last Admin  . senna-docusate (Senokot-S) tablet 1 tablet  1 tablet Oral QHS PRN Hoffman, Jessica Ratliff, DO       BP (!) 142/60   Pulse 65   Wt 75.1 kg (165 lb 9.6 oz)   SpO2 96%   BMI 30.29 kg/m  General: NAD Neck: JVP 8 cm, no thyromegaly or thyroid nodule.  Lungs: Clear to auscultation bilaterally with normal respiratory effort. CV: Nondisplaced PMI.  Heart regular S1/S2, no S3/S4, no murmur.  Trace ankle edema.  No carotid bruit.  Unable to palpate pedal pulses.  Abdomen: Soft, nontender, no hepatosplenomegaly, no distention.  Skin: Intact without lesions or rashes.  Neurologic: Alert and oriented x 3.  Psych: Normal affect. Extremities: Toes look dusky.  HEENT: Normal.   Assessment/Plan: 1. Pulmonary hypertension: I suspect that this is primarily group 2 PH (pulmonary venous hypertension from LV diastolic dysfunction) and group 3 PH (severe COPD, OSA).  V/Q scan showed no evidence for chronic PE.  I do not think that she will benefit from selective pulmonary vasodilators.  I think that the key here will continue to be good diuresis + use of oxygen and CPAP.  2. Chronic diastolic CHF with prominent RV dysfunction in the setting of severe COPD.  PYP scan in 10/21 is NOT suggestive of TTR amyloidosis.  NYHA class III symptoms (dyspnea is likely a combination of CHF and COPD), stable recently.  Probably mild volume overload by exam.  - Add Jardiance 10 mg daily.  BMET today and 10 days.   - Continue Lasix 80 qam/40 qpm.     - I will arrange for echocardiogram to reassess LV and RV function.  3. Atrial fibrillation: Paroxysmal.  She is in NSR today.  She had been off anticoagulation due to GI bleeding, now back on warfarin with  INR goal 1.5-2.5.  She had GI  bleeding in 2/19, no recent melena.   - Keep warfarin with INR 1.5-2.5 for now.  - CBC today. - With most recent data regarding NOACs and bioprosthetic valves, I suggested in the past that she switch to apixaban given GI bleeding history. She has wanted to stay on warfarin.  4. h/o GI bleeding: Possible GI AVM. Admitted with anemia again in 2/19, got 2 units PRBCs.  No overt bleeding now.  - CBC today.  5. COPD: Severe by PFTs. Unfortunately, she is still smoking.  I encouraged her to quit again.  6. CAD: Nonobstructive. She is on a statin.  - Good lipids in 8/21.  7. PAD: Worse foot pain recently, toes appear cyanotic.  Claudication in calves walking around her house.  She has been seen by Dr. Gwenlyn Found in the past, planned for medical management.  Hard to separate diabetic neuropathy pain from PAD pain.  - With some worsening of symptoms, I will repeat peripheral arterial dopplers.     Followup in 3 months.   Loralie Champagne 12/30/2019

## 2019-12-30 NOTE — Addendum Note (Signed)
Encounter addended by: [name], MD on: 12/30/2019 12:44 AM  Actions taken: Clinical Note Signed, Level of Service modified, Visit diagnoses modified

## 2019-12-30 NOTE — Telephone Encounter (Signed)
Pt left VM stating she has a very important question to ask Dr.McLean directly. She had an office visit yesterday and forgot to ask him. Pt requests return call from provider.

## 2020-01-02 ENCOUNTER — Telehealth: Payer: Self-pay | Admitting: Pharmacist

## 2020-01-02 ENCOUNTER — Telehealth: Payer: Self-pay | Admitting: Dietician

## 2020-01-02 NOTE — Telephone Encounter (Signed)
Patient reports PST POC FS INR 1.9 (target 1.5 - 2.5) on the following warfarin regimen:   Su-2.68mM-0mgT-1.25mgW-2.5mgTh-2.5mgF-2.5mgSa-2.5mg. CONTINUE same regimen. No bleeding cited by patient.

## 2020-01-02 NOTE — Telephone Encounter (Signed)
Thank you for the update Butch Penny

## 2020-01-02 NOTE — Telephone Encounter (Signed)
Asks about [name], was started on it by her heart doctor. She is to start it today at 10 mg. We discussed to look out for low blood sugars, yeast and urinary tract infections and to drink plenty/extras water with it. She agreed to call with any problems.

## 2020-01-03 ENCOUNTER — Ambulatory Visit: Payer: Self-pay

## 2020-01-03 NOTE — Telephone Encounter (Signed)
Spoke with pt regarding lung screening appt. PT would like to wait until after the first of the year to reschedule due to other appts that she is coming up. Will defer and call pt back at that time. Will close this message and refer to referral notes.

## 2020-01-04 ENCOUNTER — Other Ambulatory Visit (HOSPITAL_COMMUNITY): Payer: Self-pay | Admitting: *Deleted

## 2020-01-04 DIAGNOSIS — I5032 Chronic diastolic (congestive) heart failure: Secondary | ICD-10-CM

## 2020-01-05 ENCOUNTER — Telehealth: Payer: Self-pay | Admitting: Student

## 2020-01-05 ENCOUNTER — Encounter: Payer: Self-pay | Admitting: Student

## 2020-01-05 NOTE — Progress Notes (Deleted)
[facility] Internal Medicine Residency Telephone Encounter  HPI:   This telephone encounter was created for [name] on 01/05/2020 for the following purpose/cc ***.   Past Medical History:  Past Medical History:  Diagnosis Date  . Anxiety 07/24/2010  . Aortic atherosclerosis ([place]) 10/19/2014   Seen on CT scan, currently asymptomatic  . 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 Arthritis    "lower back; hands" (02/19/2018)  . Asymptomatic cholelithiasis 09/25/2015   Seen on CT scan 08/2015  . Carotid artery stenosis; s/p R endarterectomy    s/p right endarterectomy (06/2010) Carotid US (07/2010):  Left: Moderate-to-severe (60-79%) calcific and non-calcific plaque origin and proximal ICA and ECA   . Chronic congestive heart failure with left ventricular diastolic dysfunction ([place]) 10/21/2010  . Chronic constipation 02/03/2011  . Chronic daily headache 01/16/2014  . Chronic iron deficiency anemia   . Chronic low back pain 10/06/2012  . Chronic venous insufficiency 08/04/2012  . COPD (chronic obstructive pulmonary disease) with emphysema (HCC)    PFTs 2018: severe obstructive disease, insignif response to bronchiodilator, mild restriction parenchymal pattern, moderately severe diffusion defect. 2014  FEV1 0.92 (40%), ratio 69, 27% increase in FEV1 with BD, TLC 91%, severe airtrapping, DLCO49% On chronic home O2. Pulmonary rehab referral 05/2012   . Fibromyalgia 08/29/2010  . Gastroesophageal reflux disease   . History of blood transfusion    "several times"  (02/19/2018)  . History of clear cell renal cell carcinoma ([place]), in remission 07/21/2011   s/p cryoablation of left RCC in 09/2011 by [name]. Followed by [name]  [facility]) .    Marland Kitchen History of hiatal hernia   . History of mitral valve replacement with bioprosthetic valve due to mitral stenosis 2012   s/p MVR with a  27-mm pericardial porcine valve (Medtronic Mosaic valve, serial #[id number] on 09/20/10, Dr. Prescott Gum)   . History of obstructive sleep apnea, resolved 2013   resolved per sleep study 07/2019; no apnea, but did have desaturation.  CPAP no longer necessary.  Nocturnal polysomnography (06/2009): Moderate sleep apnea/ hypopnea syndrome , AHI 17.8 per hour with nonpositional hypopneas. CPAP titration to 12 CWP, AHI 2.4 per hour. On nocturnal CPAP via a small resMed Quattro full-face mask with heated humidifier.   Marland Kitchen History of pneumonia    "once"  (02/19/2018)  . History of seborrheic keratosis 09/28/2015  . Hyperlipidemia LDL goal < 100 11/20/2005  . Internal and external hemorrhoids without complication 7/32/2025  . Lichen sclerosus of female genitalia 01/12/2017  . Migraine    "none in years" (02/19/2018)  . Moderately severe major depression ([place]) 11/19/2005  . Nocturnal hypoxia per sleep study 07/2019   . Osteoporosis    DEXA 2016: T -2.7; DEXA (12/09/2011): L-spine T -3.7, left hip T -1.4 DEXA (12/2004): L-spine T -2.6, left hip -0.1   . Paroxysmal atrial fibrillation ([place]) 10/22/2010   s/p Left atrial maze procedure for paroxysmal atrial fibrillation on 09/20/2010 by Dr Prescott Gum.  Subsequent splenic infarct, decision was made to re-anticoagulate with coumadin, likely life-long as this is the most likely cause of the splenic infarct.   . Personal history of colonic polyps 05/14/2011   Colonoscopy (05/2011): 4 mm adenomatous polyp excised endoscopically Colonoscopy (02/2002): Adenomatous polyp excised endoscopically   . Pulmonary hypertension due to chronic obstructive pulmonary disease ([place]) 04/25/2016   2014 TEE w PA peak pressure 46 mmHg, s/p MV replacement   .  Right nephrolithiasis, asymptomatic, incidental finding 09/06/2014   5 mm non-obstructing calculus seen on CT scan 09/05/2014   . Right ventricular failure ([name]) 04/25/2016  . Severe obesity (BMI 35.0-39.9) with comorbidity ([place]) 10/23/2011    . Tobacco abuse 07/28/2012  . Type 2 diabetes mellitus with diabetic neuropathy (HCC)       ROS:      Assessment / Plan / Recommendations:   Please see A&P under problem oriented charting for assessment of the patient's acute and chronic medical conditions.   As always, pt is advised that if symptoms worsen or new symptoms arise, they should go to an urgent care facility or to to ER for further evaluation.   Consent and Medical Decision Making:   Patient {GC/GE:3044014::"discussed with","seen with"} Dr. {NAMES:[id number]::"Butcher","Granfortuna","E. Hoffman","Klima","Mullen","Narendra","Raines","Vincent"}  This is a telephone encounter between [name]	[place] and [place] on 01/05/2020 for ***. The visit was conducted with the patient located at {NAMES:[id number]::"home"} and [place] at [facility]. The patient's identity was confirmed using their DOB and current address. The {WHO:3044014::"patient","his/her legal guardian","***"} has consented to being evaluated through a telephone encounter and understands the associated risks (an examination cannot be done and the patient may need to come in for an appointment) / benefits (allows the patient to remain at home, decreasing exposure to coronavirus). I personally spent {Numbers; [phone]} minutes on medical discussion.

## 2020-01-06 ENCOUNTER — Telehealth: Payer: Self-pay | Admitting: Internal Medicine

## 2020-01-06 DIAGNOSIS — R059 Cough, unspecified: Secondary | ICD-10-CM | POA: Insufficient documentation

## 2020-01-06 NOTE — Assessment & Plan Note (Addendum)
Patient calls the emergency line for the IMTS Service. Patient endorses chills, myalgias, cough, increased sputum production, and an oral temperature of 95.1-95.4 degrees F. Patient states that she has been having difficulty sleeping tonight because of her symptoms. She states that her myalgias are likely 2/2 to her fibromyalgia, but did take her chlorpheniramine to see if her symptoms were due to allergies (Patient states that she may be allergic to her cat). She did not take her nightly Xanax out of fear of its sedative effects. After laying in bed for approximately an hour, she could not fall asleep due to her pain. She calls the service to ask if it would be okay to take her full strength Norco/Vicodin, but is also weary of its sedative effects.   Assessment:  Patient, fully vaccinated against COVID-19, presents with hypothermia, productive cough, and myalgias with questions about side effects for her medications.   Given patient's symptoms, it is worrisome for possible typical/atypical pneumonia vs allergies. Initially discussed her symptoms in light of possible underlying infectious etiology. Patient states that she will defer on visiting the nearest urgent care/ED because she has an appointment on Dec. 2 2021 in the clinic. Patient did have several coughing fits, and had currently taken her allergy medication, chlorpheniramine, but did not take her nightly benzodiazapine, given their synergistic sedating effects. Patient only takes Norco for pain, and takes 1-2 tabs of 5-325 mg every six hours PRN for pain management. Patient's disposition, was bright and cheerful during the encounter. Discussed only taking half of her pain medications, and to make sure that she is deliberate in her actions of getting up in the middle of the night to avoid possibilities of falls. This included turning on all nights and slowly getting out of bed. Patient laughed and states that she falls all the time to the point where it  is almost a habit. Again, precautions were emphasized, and that this should be discussed more at her next PCP visit, and patient voiced understanding. As always, patient was told that if her symptoms worsen she needs to be evaluated at the nearest UC/ED. - Take half of home norco for pain management - Instructed not to use xanax tonight - If symptoms worsen seek help at an ED/UC - Discuss falls at next clinic visit - Consider Allergy referral due to her allergies

## 2020-01-06 NOTE — Telephone Encounter (Signed)
Encounter made in error.

## 2020-01-06 NOTE — Progress Notes (Signed)
[facility] Internal Medicine Residency Telephone Encounter Continuity Care Appointment  HPI:   This telephone encounter was created for [name] on 01/06/2020 for the following purpose/cc medication question.   Past Medical History:  Past Medical History:  Diagnosis Date  . Anxiety 07/24/2010  . Aortic atherosclerosis ([place]) 10/19/2014   Seen on CT scan, currently asymptomatic  . 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 Arthritis    "lower back; hands" (02/19/2018)  . Asymptomatic cholelithiasis 09/25/2015   Seen on CT scan 08/2015  . Carotid artery stenosis; s/p R endarterectomy    s/p right endarterectomy (06/2010) Carotid US (07/2010):  Left: Moderate-to-severe (60-79%) calcific and non-calcific plaque origin and proximal ICA and ECA   . Chronic congestive heart failure with left ventricular diastolic dysfunction ([place]) 10/21/2010  . Chronic constipation 02/03/2011  . Chronic daily headache 01/16/2014  . Chronic iron deficiency anemia   . Chronic low back pain 10/06/2012  . Chronic venous insufficiency 08/04/2012  . COPD (chronic obstructive pulmonary disease) with emphysema (HCC)    PFTs 2018: severe obstructive disease, insignif response to bronchiodilator, mild restriction parenchymal pattern, moderately severe diffusion defect. 2014  FEV1 0.92 (40%), ratio 69, 27% increase in FEV1 with BD, TLC 91%, severe airtrapping, DLCO49% On chronic home O2. Pulmonary rehab referral 05/2012   . Fibromyalgia 08/29/2010  . Gastroesophageal reflux disease   . History of blood transfusion    "several times"  (02/19/2018)  . History of clear cell renal cell carcinoma ([place]), in remission 07/21/2011   s/p cryoablation of left RCC in 09/2011 by [name]. Followed by [name]  [facility]) .    Marland Kitchen History of hiatal hernia   . History of mitral valve replacement with bioprosthetic valve  due to mitral stenosis 2012   s/p MVR with a 27-mm pericardial porcine valve (Medtronic Mosaic valve, serial #[id number] on 09/20/10, Dr. Prescott Gum)   . History of obstructive sleep apnea, resolved 2013   resolved per sleep study 07/2019; no apnea, but did have desaturation.  CPAP no longer necessary.  Nocturnal polysomnography (06/2009): Moderate sleep apnea/ hypopnea syndrome , AHI 17.8 per hour with nonpositional hypopneas. CPAP titration to 12 CWP, AHI 2.4 per hour. On nocturnal CPAP via a small resMed Quattro full-face mask with heated humidifier.   Marland Kitchen History of pneumonia    "once"  (02/19/2018)  . History of seborrheic keratosis 09/28/2015  . Hyperlipidemia LDL goal < 100 11/20/2005  . Internal and external hemorrhoids without complication 08/08/6379  . Lichen sclerosus of female genitalia 01/12/2017  . Migraine    "none in years" (02/19/2018)  . Moderately severe major depression ([place]) 11/19/2005  . Nocturnal hypoxia per sleep study 07/2019   . Osteoporosis    DEXA 2016: T -2.7; DEXA (12/09/2011): L-spine T -3.7, left hip T -1.4 DEXA (12/2004): L-spine T -2.6, left hip -0.1   . Paroxysmal atrial fibrillation ([place]) 10/22/2010   s/p Left atrial maze procedure for paroxysmal atrial fibrillation on 09/20/2010 by Dr Prescott Gum.  Subsequent splenic infarct, decision was made to re-anticoagulate with coumadin, likely life-long as this is the most likely cause of the splenic infarct.   . Personal history of colonic polyps 05/14/2011   Colonoscopy (05/2011): 4 mm adenomatous polyp excised endoscopically Colonoscopy (02/2002): Adenomatous polyp excised endoscopically   . Pulmonary hypertension due to chronic obstructive pulmonary disease ([place]) 04/25/2016   2014 TEE w PA peak pressure 46 mmHg, s/p  MV replacement   . Right nephrolithiasis, asymptomatic, incidental finding 09/06/2014   5 mm non-obstructing calculus seen on CT scan 09/05/2014   . Right ventricular failure ([place]) 04/25/2016  . Severe obesity (BMI  35.0-39.9) with comorbidity ([place]) 10/23/2011  . Tobacco abuse 07/28/2012  . Type 2 diabetes mellitus with diabetic neuropathy (HCC)       ROS:  Review of Systems  Constitutional: Positive for chills and malaise/fatigue.       Reports temperature of 95.4  HENT: Positive for congestion.   Eyes: Negative for blurred vision and double vision.  Respiratory: Positive for cough and sputum production. Negative for hemoptysis, shortness of breath and wheezing.   Cardiovascular: Negative for chest pain, palpitations, orthopnea, claudication and leg swelling.  Gastrointestinal: Negative for abdominal pain, constipation, diarrhea, nausea and vomiting.  Musculoskeletal: Positive for myalgias.  Skin: Negative for itching and rash.  Neurological: Negative for dizziness, tingling and headaches.     Assessment / Plan / Recommendations:   Please see A&P under problem oriented charting for assessment of the patient's acute and chronic medical conditions.   As always, pt is advised that if symptoms worsen or new symptoms arise, they should go to an urgent care facility or to to ER for further evaluation.   Consent and Medical Decision Making:    This is a telephone encounter between [name]	[place] and [name] on 01/06/2020 for medication questions. The visit was conducted with the patient located at home and [name] at [facility]. The patient's identity was confirmed using their DOB and current address. The patient has consented to being evaluated through a telephone encounter and understands the associated risks (an examination cannot be done and the patient may need to come in for an appointment) / benefits (allows the patient to remain at home, decreasing exposure to coronavirus). I personally spent 12 minutes on medical discussion.

## 2020-01-09 ENCOUNTER — Other Ambulatory Visit: Payer: Self-pay

## 2020-01-09 ENCOUNTER — Ambulatory Visit (HOSPITAL_COMMUNITY)
Admission: RE | Admit: 2020-01-09 | Discharge: 2020-01-09 | Disposition: A | Discharge status: Home / Self Care | Payer: HMO | Source: Ambulatory Visit | Attending: Cardiology | Admitting: Cardiology

## 2020-01-09 ENCOUNTER — Telehealth: Payer: Self-pay | Admitting: Pharmacist

## 2020-01-09 DIAGNOSIS — I5032 Chronic diastolic (congestive) heart failure: Secondary | ICD-10-CM | POA: Diagnosis not present

## 2020-01-09 LAB — BASIC METABOLIC PANEL
Anion gap: 10 (ref 5–15)
BUN: 18 mg/dL (ref 8–23)
CO2: 30 mmol/L (ref 22–32)
Calcium: 9.3 mg/dL (ref 8.9–10.3)
Chloride: 97 mmol/L — ABNORMAL LOW (ref 98–111)
Creatinine, Ser: 1.23 mg/dL — ABNORMAL HIGH (ref 0.44–1.00)
GFR, Estimated: 47 mL/min — ABNORMAL LOW (ref >60)
Glucose, Bld: 172 mg/dL — ABNORMAL HIGH (ref 70–99)
Potassium: 4 mmol/L (ref 3.5–5.1)
Sodium: 137 mmol/L (ref 135–145)

## 2020-01-09 NOTE — Telephone Encounter (Signed)
Received PST POC INR results from [facility]. INR = 1.8 (INR goal 1.5 - 2.5). Patient advised to CONTINUE 1x2.26m warfarin on SuWedThuFriSat; 1/2 x 2.575m(1.2542mose) on Tu; NO warfarin on Monday. Repeat PST INR 6DEC21. Patient states there are no signs or symptoms of bleeding, no signs or symptoms of potential VTE, no new medications. Adequate supply of warfarin on-hand she states.

## 2020-01-10 ENCOUNTER — Ambulatory Visit
Admission: RE | Admit: 2020-01-10 | Discharge: 2020-01-10 | Disposition: A | Discharge status: Home / Self Care | Payer: HMO | Source: Ambulatory Visit | Attending: Internal Medicine | Admitting: Internal Medicine

## 2020-01-10 DIAGNOSIS — Z1231 Encounter for screening mammogram for malignant neoplasm of breast: Secondary | ICD-10-CM | POA: Diagnosis not present

## 2020-01-10 DIAGNOSIS — J439 Emphysema, unspecified: Secondary | ICD-10-CM | POA: Diagnosis not present

## 2020-01-10 DIAGNOSIS — I270 Primary pulmonary hypertension: Secondary | ICD-10-CM | POA: Diagnosis not present

## 2020-01-10 DIAGNOSIS — J449 Chronic obstructive pulmonary disease, unspecified: Secondary | ICD-10-CM | POA: Diagnosis not present

## 2020-01-11 NOTE — Progress Notes (Incomplete Revision)
Established Patient Office Visit  Subjective:  Patient ID: [name], female    DOB: Sep 17, 1950  Age: 69 y.o. MRN: [id number]  CC: No chief complaint on file.   HPI [name] presents for monthly f/u of chronic conditions.  Since last visit, she has seen Dr. Aundra Dubin in [facility]; good news is that her studies were NOT c/w cardiac amyloidosis!Marland Kitchen  He prescribed Jardiance and ordered LE dopplers to evaluate her worsening foot pain.  At last visit, she was experiencing severe back pain.  We discussed the option of a referral for spinal steroid injections, which she has not undergone in the past.  Tinea pedis was identified and terbinafine topical prescribed.  We discussed the overwhelming nature of her multimorbidity and her declining mobility and quality of life.  Admission for endoscopy?  Terbinafine prescription replaced with otc fungal cream by pharamcy, she has minimally used it. Card and GI haven't coordinated yet on colonoscopy inpatient. Mood sad and frustrated; had planned to meet therapist today though this will be rescheduled due to lack of time. LE dopplers have been ordered; she has claudication type pain and during exam pain worsened by elevation.  No edema, lungs clear.  Feet scaly but improved, no open areas, toenails reduced today. No pulses, toes blue as always. She describes hx of LE vascular occlusion for which stents were recommended,, though she didn't want ot stop smoking.  EVen now she isn't willing to stop - it's the only thing I have in life, the only thing that gives me comfort. Even if it meant amputation? She couldn't imaging that decision.  Hand and foot pain is severe.  Nothing helps.  Worse at night.  Cant sleep.  Lung ca screening - wants to wait PT/OT - wants to wait       Objective:    Physical Exam      Health Maintenance Due  Topic Date Due  . OPHTHALMOLOGY EXAM  11/10/2019  . URINE MICROALBUMIN  12/16/2019       Lab Results  Component Value Date   TSH 1.200 09/01/2019   Lab Results  Component Value Date   WBC 8.9 12/29/2019   HGB 12.3 12/29/2019   HCT 39.0 12/29/2019   MCV 92.6 12/29/2019   PLT 244 12/29/2019   Lab Results  Component Value Date   NA 137 01/09/2020   K 4.0 01/09/2020   CO2 30 01/09/2020   GLUCOSE 172 (H) 01/09/2020   BUN 18 01/09/2020   CREATININE 1.23 (H) 01/09/2020   BILITOT 0.6 09/26/2019   ALKPHOS 60 09/26/2019   AST 15 09/26/2019   ALT 12 09/26/2019   PROT 6.8 09/26/2019   ALBUMIN 3.7 09/26/2019   CALCIUM 9.3 01/09/2020   ANIONGAP 10 01/09/2020   GFR 49.06 (L) 12/29/2012   Lab Results  Component Value Date   CHOL 122 09/26/2019   Lab Results  Component Value Date   HDL 35 (L) 09/26/2019   Lab Results  Component Value Date   LDLCALC 46 09/26/2019   Lab Results  Component Value Date   TRIG 204 (H) 09/26/2019   Lab Results  Component Value Date   CHOLHDL 3.5 09/26/2019   Lab Results  Component Value Date   HGBA1C 6.9 (A) 12/08/2019      Assessment & Plan:   Problem List Items Addressed This Visit    None        Follow-up: No follow-ups on file.    Angelica Pou,  MD 

## 2020-01-11 NOTE — Progress Notes (Addendum)
Established Patient Office Visit  Subjective:  Patient ID: [name], female    DOB: 14-Jun-1950  Age: 69 y.o. MRN: [id number]  CC: No chief complaint on file.   HPI [name] presents for monthly f/u of chronic conditions.  Since last visit, she has seen Dr. Aundra Dubin in [facility]; good news is that her studies were NOT c/w cardiac amyloidosis!Marland Kitchen  He prescribed Jardiance and ordered LE dopplers to evaluate her worsening foot pain.  At last visit, she was experiencing severe back pain.  We discussed the option of a referral for spinal steroid injections, which she has not undergone in the past.  Tinea pedis was identified and terbinafine topical prescribed.  We discussed the overwhelming nature of her multimorbidity and her declining mobility and quality of life.  Terbinafine prescription replaced with otc fungal cream by pharamcy, she has minimally used it. Card and GI haven't coordinated yet on colonoscopy inpatient. Mood sad and frustrated; had planned to meet therapist today though this will be rescheduled due to lack of time. LE dopplers have been ordered; she has claudication type pain and during exam pain worsened by elevation.  No edema, lungs clear.  Feet scaly but improved, no open areas, toenails reduced today. No pulses, toes blue as always. She describes hx of LE vascular occlusion for which stents were recommended,, though she didn't want to stop smoking at the time.  Even now she isn't willing to stop - I"t's the only thing I have in life, the only thing that gives me comfort." Even if it meant amputation? She couldn't imaging that decision.  Hand and foot pain is severe.  Nothing helps.  Worse at night.  Cant sleep. New "attack" of infection beneath her breasts, "I was told it might happen with Jardiance".   Lung ca screening - wants to wait PT/OT - wants to wait    Objective:    Physical Exam  Uncomfortable appearing, sitting in transport chair.   Waxing/waning tremulousness vs fine motor tremor of hands.  Lungs are clear.  Feet cyanotic (chronic) and warm.  Nails were long and were reduced today.  Pain in feet increased with elevation above heart level, improved when placed dependent.  Scaling from tinea pedis is not as severe though still present.  Feet pulseless.  No edema.  Well circumscribed areas intertriginous below breasts are erythematous and denuded without satellite lesions.  Odor of yeast.    Health Maintenance Due  Topic Date Due  . OPHTHALMOLOGY EXAM  11/10/2019  . URINE MICROALBUMIN  12/16/2019    Lab Results  Component Value Date   TSH 1.200 09/01/2019   Lab Results  Component Value Date   WBC 8.9 12/29/2019   HGB 12.3 12/29/2019   HCT 39.0 12/29/2019   MCV 92.6 12/29/2019   PLT 244 12/29/2019   Lab Results  Component Value Date   NA 137 01/09/2020   K 4.0 01/09/2020   CO2 30 01/09/2020   GLUCOSE 172 (H) 01/09/2020   BUN 18 01/09/2020   CREATININE 1.23 (H) 01/09/2020   BILITOT 0.6 09/26/2019   ALKPHOS 60 09/26/2019   AST 15 09/26/2019   ALT 12 09/26/2019   PROT 6.8 09/26/2019   ALBUMIN 3.7 09/26/2019   CALCIUM 9.3 01/09/2020   ANIONGAP 10 01/09/2020   GFR 49.06 (L) 12/29/2012   Lab Results  Component Value Date   CHOL 122 09/26/2019   Lab Results  Component Value Date   HDL 35 (L) 09/26/2019  Lab Results  Component Value Date   LDLCALC 46 09/26/2019   Lab Results  Component Value Date   TRIG 204 (H) 09/26/2019   Lab Results  Component Value Date   CHOLHDL 3.5 09/26/2019   Lab Results  Component Value Date   HGBA1C 6.9 (A) 12/08/2019      Assessment & Plan:   See problem based charting.  In general Ms. Sortino continues to be understandably overwhelmed by her very symptomatic chronic conditions which negatively impact her QOL, though it was good news to learn that she does not have cardiac amyloidosis.  Immediate concerns are her increasingly symptomatic PAD, the plan for  inpatient endoscopy for evaluation of her iron deficiency anemia, and her severe chronic back pain unrelieved by her current long term opioid therapy.  She wishes to wait on PT/OT and on referral for consideration of epidural steroid injections.  Increasing Xultophy to 36 units for ongoing hyperglycemia, though she was also recently started on Jardiance by her cardiologist.  I continue to work on updating her medical chart.    Follow-up:  1 month recheck    Angelica Pou, MD

## 2020-01-12 ENCOUNTER — Telehealth: Payer: Self-pay | Admitting: Dietician

## 2020-01-12 ENCOUNTER — Encounter: Payer: Self-pay | Admitting: Internal Medicine

## 2020-01-12 ENCOUNTER — Ambulatory Visit: Payer: HMO | Admitting: Behavioral Health

## 2020-01-12 ENCOUNTER — Ambulatory Visit (INDEPENDENT_AMBULATORY_CARE_PROVIDER_SITE_OTHER): Payer: HMO | Admitting: Internal Medicine

## 2020-01-12 DIAGNOSIS — E1140 Type 2 diabetes mellitus with diabetic neuropathy, unspecified: Secondary | ICD-10-CM | POA: Diagnosis not present

## 2020-01-12 DIAGNOSIS — D509 Iron deficiency anemia, unspecified: Secondary | ICD-10-CM | POA: Diagnosis not present

## 2020-01-12 DIAGNOSIS — S90819A Abrasion, unspecified foot, initial encounter: Secondary | ICD-10-CM

## 2020-01-12 DIAGNOSIS — Z794 Long term (current) use of insulin: Secondary | ICD-10-CM | POA: Diagnosis not present

## 2020-01-12 DIAGNOSIS — S90812A Abrasion, left foot, initial encounter: Secondary | ICD-10-CM

## 2020-01-12 DIAGNOSIS — M79674 Pain in right toe(s): Secondary | ICD-10-CM | POA: Diagnosis not present

## 2020-01-12 DIAGNOSIS — X58XXXA Exposure to other specified factors, initial encounter: Secondary | ICD-10-CM

## 2020-01-12 DIAGNOSIS — S90811A Abrasion, right foot, initial encounter: Secondary | ICD-10-CM

## 2020-01-12 DIAGNOSIS — B351 Tinea unguium: Secondary | ICD-10-CM

## 2020-01-12 DIAGNOSIS — M79675 Pain in left toe(s): Secondary | ICD-10-CM | POA: Diagnosis not present

## 2020-01-12 DIAGNOSIS — I739 Peripheral vascular disease, unspecified: Secondary | ICD-10-CM

## 2020-01-12 DIAGNOSIS — B372 Candidiasis of skin and nail: Secondary | ICD-10-CM | POA: Diagnosis not present

## 2020-01-12 DIAGNOSIS — I43 Cardiomyopathy in diseases classified elsewhere: Secondary | ICD-10-CM | POA: Diagnosis not present

## 2020-01-12 DIAGNOSIS — E854 Organ-limited amyloidosis: Secondary | ICD-10-CM

## 2020-01-12 MED ORDER — NYSTATIN 100000 UNIT/GM EX POWD: 1.0000 "application " | Freq: Three times a day (TID) | CUTANEOUS | 0 refills | Status: DC

## 2020-01-12 NOTE — Telephone Encounter (Signed)
Uploaded her CGM for her 10:45 AM appointment today. Asks for a copy to be given to her doctor and would like one as well.

## 2020-01-12 NOTE — Patient Instructions (Signed)
[name], I wish you were feeling better today, but I'm glad you are getting some attention for the quality of circulation in your feet, which may be contributing to the pain in your feet.    We will be increasing the dose of your Xultophy to 36 units.  I refilled your nystatin today.  Let's get together in about 4 weeks to recheck.  Toenails trimmed!  Hang in there.  I'm thrilled that you don't have to worry about possibly amyloidosis of the heart.  Good news.  Dr. [name]

## 2020-01-12 NOTE — Progress Notes (Signed)
  This encounter was created in error - please disregard.

## 2020-01-16 ENCOUNTER — Encounter: Payer: Self-pay | Admitting: Internal Medicine

## 2020-01-16 ENCOUNTER — Telehealth: Payer: Self-pay | Admitting: Pharmacist

## 2020-01-16 ENCOUNTER — Other Ambulatory Visit: Payer: Self-pay

## 2020-01-16 DIAGNOSIS — I480 Paroxysmal atrial fibrillation: Secondary | ICD-10-CM | POA: Diagnosis not present

## 2020-01-16 DIAGNOSIS — Z7901 Long term (current) use of anticoagulants: Secondary | ICD-10-CM | POA: Diagnosis not present

## 2020-01-16 NOTE — Telephone Encounter (Signed)
Patient texted me results of PST FS POC INR 2.2 (goal  1.5 - 2.5) on 13.19m warfarin/wk. Will reduce to 12.519mwarfarin/wk. Repeat INR in 1 week. No bleeding reported.

## 2020-01-16 NOTE — Assessment & Plan Note (Signed)
Excoriations healed.

## 2020-01-16 NOTE — Assessment & Plan Note (Signed)
Further studies reviewed by cardiologist are not consistent with amyloidosis.  This problem will be resolved rather than deleted for availability of documentation.

## 2020-01-16 NOTE — Patient Outreach (Signed)
  [facility]) Care Management Chronic Special Needs Program    01/16/2020  Name: Sadia Belfiore, DOB: 1950-02-16  MRN: [id number]   Ms. Nasirah Sachs is enrolled in a chronic special needs plan for Heart Failure.[facility] Management will continue to provide services for this member through 02/10/20.  The [employer] care management team will assume care 02/11/2020.   Quinn Plowman RN,BSN,CCM [facility] Care Management [phone]

## 2020-01-17 ENCOUNTER — Ambulatory Visit (HOSPITAL_COMMUNITY)
Admission: RE | Admit: 2020-01-17 | Discharge: 2020-01-17 | Disposition: A | Discharge status: Home / Self Care | Payer: HMO | Source: Ambulatory Visit | Attending: Cardiology | Admitting: Cardiology

## 2020-01-17 ENCOUNTER — Ambulatory Visit (HOSPITAL_BASED_OUTPATIENT_CLINIC_OR_DEPARTMENT_OTHER)
Admission: RE | Admit: 2020-01-17 | Discharge: 2020-01-17 | Disposition: A | Discharge status: Home / Self Care | Payer: HMO | Source: Ambulatory Visit

## 2020-01-17 ENCOUNTER — Other Ambulatory Visit: Payer: Self-pay

## 2020-01-17 DIAGNOSIS — I5032 Chronic diastolic (congestive) heart failure: Secondary | ICD-10-CM | POA: Insufficient documentation

## 2020-01-17 DIAGNOSIS — I739 Peripheral vascular disease, unspecified: Secondary | ICD-10-CM | POA: Diagnosis not present

## 2020-01-17 DIAGNOSIS — F17200 Nicotine dependence, unspecified, uncomplicated: Secondary | ICD-10-CM | POA: Diagnosis not present

## 2020-01-17 DIAGNOSIS — J449 Chronic obstructive pulmonary disease, unspecified: Secondary | ICD-10-CM | POA: Diagnosis not present

## 2020-01-17 DIAGNOSIS — E785 Hyperlipidemia, unspecified: Secondary | ICD-10-CM | POA: Insufficient documentation

## 2020-01-17 DIAGNOSIS — I110 Hypertensive heart disease with heart failure: Secondary | ICD-10-CM | POA: Diagnosis not present

## 2020-01-17 DIAGNOSIS — I272 Pulmonary hypertension, unspecified: Secondary | ICD-10-CM | POA: Insufficient documentation

## 2020-01-17 DIAGNOSIS — I4891 Unspecified atrial fibrillation: Secondary | ICD-10-CM | POA: Insufficient documentation

## 2020-01-17 DIAGNOSIS — I081 Rheumatic disorders of both mitral and tricuspid valves: Secondary | ICD-10-CM | POA: Insufficient documentation

## 2020-01-17 LAB — ECHOCARDIOGRAM COMPLETE
Area-P 1/2: 2.17 cm2
S' Lateral: 2.4 cm

## 2020-01-17 NOTE — Progress Notes (Signed)
  Echocardiogram 2D Echocardiogram has been performed.  [name] G Naethan Bracewell 01/17/2020, 9:45 AM

## 2020-01-23 ENCOUNTER — Telehealth: Payer: Self-pay | Admitting: Pharmacist

## 2020-01-23 NOTE — Telephone Encounter (Signed)
Patient reporting PST FS POC INR today, 1.60 (target goal 1.5 - 2.5) on 12.66m/wk warfarin. INCREASED to 13.742mwarfarin/wk as: NO coumadin on Monday (patient preference); 1x2.42m61mn Tu/We/Th/Sa/Su; 1/2 x 2.42mg942m Th. Repeat PST FS POC INR 1 week.

## 2020-01-24 MED ORDER — XULTOPHY 100-3.6 UNIT-MG/ML ~~LOC~~ SOPN: 36.0000 [IU] | PEN_INJECTOR | Freq: Every day | SUBCUTANEOUS | 5 refills | Status: DC

## 2020-01-24 NOTE — Assessment & Plan Note (Addendum)
Cardiologist has ordered dopplers; symptoms do sound consistent with claudication.  Known disease, though she is not yet ready to quit smoking even with the specter of potential of worsening to limb-threatening ischemia. We'll continue to talk through this.

## 2020-01-24 NOTE — Assessment & Plan Note (Addendum)
Hyperglycemic based on CGM; increase Xultophy to 36 units.  Jardiance added by cardiologist recently as well.  Next A1C 02/2019.

## 2020-01-24 NOTE — Assessment & Plan Note (Signed)
Toenails trimmed this visit.

## 2020-01-24 NOTE — Assessment & Plan Note (Signed)
Current episode beneath breasts; has medication available.

## 2020-01-24 NOTE — Assessment & Plan Note (Signed)
Hgb improved to 12.3 as checked 12/2019.  GI loss is presumed, inpatient endoscopy will be arranged once cardiology and GI are able to coordinate.

## 2020-01-24 NOTE — Addendum Note (Signed)
Addended by: Dorian Pod A on: 01/24/2020 04:55 PM   Modules accepted: Orders

## 2020-01-25 ENCOUNTER — Other Ambulatory Visit: Payer: Self-pay | Admitting: Internal Medicine

## 2020-01-26 ENCOUNTER — Telehealth: Payer: Self-pay | Admitting: Dietician

## 2020-01-26 NOTE — Telephone Encounter (Signed)
Forgot shot until 10 pm last wanted to know when she should take it today.she reports her blood sugars are better.   pharmacy question- refills on Freestyle Libre 2, due for refill this month, 2 requests last month to Dr. Lynnae January; she asked them to change to Dr. [name]; needs refill ASAP [place]- [phone] Advised her to take the xultophy on time today. [employer] and they are faxing her papers.

## 2020-01-30 ENCOUNTER — Telehealth: Payer: Self-pay | Admitting: Pharmacist

## 2020-01-30 NOTE — Telephone Encounter (Signed)
Patient reports results of PST FS POC INR 1.6 (target 1.5 - 2.5) on a regimen of 36m M 2.551mon T, We, Th, Sa, Su; 1.2534mn Friday. CONTINUE same regimen. No bleeding sx's; no embolic sx's. REPEAT PST FS POC INR 27DEC21.

## 2020-02-01 ENCOUNTER — Other Ambulatory Visit: Payer: Self-pay | Admitting: *Deleted

## 2020-02-01 ENCOUNTER — Other Ambulatory Visit (HOSPITAL_COMMUNITY): Payer: Self-pay | Admitting: Cardiology

## 2020-02-01 DIAGNOSIS — I5032 Chronic diastolic (congestive) heart failure: Secondary | ICD-10-CM

## 2020-02-01 DIAGNOSIS — K219 Gastro-esophageal reflux disease without esophagitis: Secondary | ICD-10-CM

## 2020-02-01 DIAGNOSIS — K449 Diaphragmatic hernia without obstruction or gangrene: Secondary | ICD-10-CM

## 2020-02-01 MED ORDER — OMEPRAZOLE 40 MG PO CPDR: 40.0000 mg | DELAYED_RELEASE_CAPSULE | Freq: Two times a day (BID) | ORAL | 1 refills | Status: DC

## 2020-02-01 NOTE — Progress Notes (Signed)
02/02/20- 1 yoF Smoker for evaluation of Nocturnal Hypoxemia courtesy of Dr [name] with original question of OSA Medical problem list includes Aortic  Atherosclerosis, AVM GI tract, CHF, Mitral Valve Replacement,  Cardiac Amyloid, PAD, Venous Insufficiency, HTN, Migraine, COPD, Pulmonary Hypertension, GERD, DM2, Hx Renal Carcinoma, Depression, Tobacco abuse, Obesity,  NPSG at [facility] 01/18/03- AHI 8.6/ hr -Nocturnal polysomnography (06/2009): Moderate sleep apnea/ hypopnea syndrome , AHI 17.8 per hour with nonpositional hypopneas. CPAP titration to 12 CWP, AHI 2.4 per hour.Body weight 183 lbs On nocturnal CPAP via a small resMed Quattro full-face mask with heated humidifier. NPSG 07/22/19- AHI 0.0/ htr, desat to 83%, with mean 93% after adding O2 2L, body weight 170 lbs Epworth score- 15 Body weight today- 166 lbs Covid vax-2 Moderna Flu vax- had PFT 04/23/16- Mod severe obstr F/F 0.69, FEV1 1.05/ 48%, insig resp to BD, TLC 76%, DLCO 52% Proair hfa,  Advair 500, Spira,  -----Patient was last seen in 2014, had sleep study done 6/11 and was told that she does not have sleep apnea.  Using Xanax 1 mg for sleep. Sleep is restless with frequent wakings. Tends to daytime drowsy, falling asleep in chair. 1/2 cup coffee. O2 2L sleep- x 70 years. Owns her concentrator. Has portable O2  Has home nebulizer. Current smoker with no interest in quitting.   CXR 11/13/17-  IMPRESSION: Minimal pulmonary vascular congestion. Calcific atherosclerotic disease and tortuosity of the aorta.  Prior to Admission medications   Medication Sig Start Date End Date Taking? Authorizing Provider  albuterol (PROAIR HFA) 108 (90 Base) MCG/ACT inhaler Inhale 2 puffs into the lungs every 6 (six) hours as needed for shortness of breath. 11/05/19  Yes Angelica Pou, MD  benzonatate (TESSALON) 100 MG capsule Take 1 capsule (100 mg total) by mouth 3 (three) times daily as needed for cough. 12/16/18  Yes  Bartholomew Crews, MD  Blood Glucose Monitoring Suppl (ONETOUCH VERIO) W/DEVICE KIT 1 each by Does not apply route 4 (four) times daily. 07/25/14  Yes [name], MD  buPROPion (WELLBUTRIN) 75 MG tablet Take 1 tablet (75 mg total) by mouth 2 (two) times daily. 07/13/19  Yes Bartholomew Crews, MD  chlorpheniramine (CHLOR-TRIMETON) 4 MG tablet Take 4 mg by mouth daily as needed for allergies.   Yes [provider]  DULoxetine (CYMBALTA) 60 MG capsule Take 1 capsule (60 mg total) by mouth 2 (two) times daily. 12/09/19 01/08/20 Yes Angelica Pou, MD  Fluticasone-Salmeterol (ADVAIR) 500-50 MCG/DOSE AEPB INHALE 1 PUFF INTO THE LUNGS TWICE DAILY 09/01/19  Yes Angelica Pou, MD  gabapentin (NEURONTIN) 300 MG capsule Take 2 capsules (600 mg total) by mouth 3 (three) times daily. 07/13/19  Yes Bartholomew Crews, MD  hydrocortisone cream 1 % APPLY EXTERNALLY TO THE AFFECTED AREA TWICE DAILY 12/21/18  Yes Bartholomew Crews, MD  Insulin Degludec-Liraglutide (XULTOPHY) 100-3.6 UNIT-MG/ML SOPN Inject 36 Units into the skin daily for 30 doses. 01/24/20 02/23/20 Yes Angelica Pou, MD  Insulin Pen Needle 32G X 4 MM MISC Use to inject insulin up to 6 times a day 08/04/17  Yes [name], MD  Insulin Syringe-Needle U-100 31G X 15/64" 0.5 ML MISC Use to inject insulin up to 4 times a day 04/17/16  Yes Narendra, Nischal, MD  ipratropium-albuterol (DUONEB) 0.5-2.5 (3) MG/3ML SOLN INHALE THE CONTENTS OF 1 VIAL VIA NEBULIZER EVERY 6 HOURS AS NEEDED FOR SHORTNESS OF BREATH 06/01/19  Yes Bartholomew Crews, MD  Lancets Misc. (ACCU-CHEK FASTCLIX  LANCET) KIT Check your blood 4 times a day dx code 250.00 insulin requiring 01/19/13  Yes [name], MD  lidocaine (LIDODERM) 5 % Place 1 patch onto the skin every 12 (twelve) hours. Remove & Discard patch within 12 hours or as directed by MD 12/09/19 12/08/20 Yes Angelica Pou, MD  metoprolol succinate (TOPROL-XL) 50 MG 24 hr tablet  Take 1 tablet (50 mg total) by mouth daily. Take with or immediately following a meal. 04/12/19  Yes Clegg, [name] D, NP  nystatin (NYSTATIN) powder Apply 1 application topically 3 (three) times daily. 01/12/20  Yes Angelica Pou, MD  OXYGEN Inhale 2 L into the lungs at bedtime.   Yes [provider]  potassium chloride SA (KLOR-CON) 20 MEQ tablet Take 2 tabs by mouth every morning and 1 tab every evening to replace potassium lost from lasix use. 09/01/19  Yes Angelica Pou, MD  PRESCRIPTION MEDICATION Inhale into the lungs at bedtime. CPAP   Yes [provider]  rosuvastatin (CRESTOR) 20 MG tablet TAKE 1 TABLET BY MOUTH ONCE EVERY NIGHT AT BEDTIME 07/13/19  Yes Bartholomew Crews, MD  SPIRIVA RESPIMAT 2.5 MCG/ACT AERS INHALE 2 PUFFS BY MOUTH EVERY DAY 01/11/20  Yes Angelica Pou, MD  terbinafine (LAMISIL) 1 % cream Apply cream to clean and dry feet twice a day until gone.  Apply to toes, and to sides, heels, and soles of feet. 12/09/19  Yes Angelica Pou, MD  warfarin (COUMADIN) 2.5 MG tablet Take 2.79m warfarin on We/Th/Sa/Su; 075mon Mondays; 1.2576m1/2 x 2.5mg40mblet) on Tu/Fri. 07/28/19  Yes GuilVelna Ochs  ALPRAZolam (XANAX) 1 MG tablet TAKE 1 TABLET BY MOUTH EVERY NIGHT AT BEDTIME AS NEEDED FOR INSOMNIA. MAY TAKE 1/2 TABLET BY MOUTH DURING THE DAY FOR ANXIETY 02/13/20   GuilVelna Ochs  Continuous Blood Gluc Sensor (FREESTYLE LIBRE 2 SENSOR) MISC 1 each by Does not apply route 6 (six) times daily. To read 6 times daily with change of sensor every 2 weeks, 90 days worth with 3 refills 02/14/20   WillAngelica Pou  fluticasone ([facility] MCG/ACT nasal spray Place 2 sprays into both nostrils daily. 02/13/20   GuilVelna Ochs  Fluticasone-Umeclidin-Vilant (TRELEGY ELLIPTA) 100-62.5-25 MCG/INH AEPB Inhale 1 puff into the lungs daily. 02/02/20   YounBaird LyonsMD  Fluticasone-Umeclidin-Vilant (TRELEGY ELLIPTA) 100-62.5-25 MCG/INH AEPB Inhale  1 puff into the lungs daily. 02/07/20   YounDeneise Lever  furosemide (LASIX) 80 MG tablet 1 tab am, 1/2 tab pm 02/28/20   Barrett, RhonEvelene Croon-C  glucose blood (ONETOUCH VERIO) test strip Use to check blood sugar up to 7 times weekly. diag code E11.40. Insulin dependent 03/01/20   WillAngelica Pou  HYDROcodone-acetaminophen (NORCO/VICODIN) 5-325 MG tablet Take 1-2 tablets by mouth every 6 (six) hours as needed for severe pain. 02/13/20 03/14/20  GuilVelna Ochs  omeprazole (PRILOSEC) 40 MG capsule Take 1 capsule (40 mg total) by mouth in the morning and at bedtime. 03/07/20 04/06/20  WillAngelica Pou   Past Medical History:  Diagnosis Date  . Anxiety 07/24/2010  . Aortic atherosclerosis (HCC)[place]8/2016   Seen on CT scan, currently asymptomatic  . 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 Arthritis    "lower back; hands" (02/19/2018)  . Asymptomatic cholelithiasis 09/25/2015   Seen on CT scan 08/2015  . Carotid artery stenosis; s/p R endarterectomy  s/p right endarterectomy (06/2010) Carotid US (07/2010):  Left: Moderate-to-severe (60-79%) calcific and non-calcific plaque origin and proximal ICA and ECA   . Chronic congestive heart failure with left ventricular diastolic dysfunction ([place]) 10/21/2010  . Chronic constipation 02/03/2011  . Chronic daily headache 01/16/2014  . Chronic iron deficiency anemia   . Chronic low back pain 10/06/2012  . Chronic venous insufficiency 08/04/2012  . COPD (chronic obstructive pulmonary disease) with emphysema (HCC)    PFTs 2018: severe obstructive disease, insignif response to bronchiodilator, mild restriction parenchymal pattern, moderately severe diffusion defect. 2014  FEV1 0.92 (40%), ratio 69, 27% increase in FEV1 with BD, TLC 91%, severe airtrapping, DLCO49% On chronic home O2. Pulmonary rehab referral 05/2012   . Fibromyalgia 08/29/2010  .  Gastroesophageal reflux disease   . History of blood transfusion    "several times"  (02/19/2018)  . History of clear cell renal cell carcinoma ([place]), in remission 07/21/2011   s/p cryoablation of left RCC in 09/2011 by [name]. Followed by [name]  [facility]) .    Marland Kitchen History of hiatal hernia   . History of mitral valve replacement with bioprosthetic valve due to mitral stenosis 2012   s/p MVR with a 27-mm pericardial porcine valve (Medtronic Mosaic valve, serial #[id number] on 09/20/10, Dr. Prescott Gum)   . History of obstructive sleep apnea, resolved 2013   resolved per sleep study 07/2019; no apnea, but did have desaturation.  CPAP no longer necessary.  Nocturnal polysomnography (06/2009): Moderate sleep apnea/ hypopnea syndrome , AHI 17.8 per hour with nonpositional hypopneas. CPAP titration to 12 CWP, AHI 2.4 per hour. On nocturnal CPAP via a small resMed Quattro full-face mask with heated humidifier.   Marland Kitchen History of pneumonia    "once"  (02/19/2018)  . History of seborrheic keratosis 09/28/2015  . Hyperlipidemia LDL goal < 100 11/20/2005  . Internal and external hemorrhoids without complication 5/62/1308  . Lichen sclerosus of female genitalia 01/12/2017  . Migraine    "none in years" (02/19/2018)  . Moderately severe major depression ([place]) 11/19/2005  . Nocturnal hypoxia per sleep study 07/2019   . Osteoporosis    DEXA 2016: T -2.7; DEXA (12/09/2011): L-spine T -3.7, left hip T -1.4 DEXA (12/2004): L-spine T -2.6, left hip -0.1   . Paroxysmal atrial fibrillation ([place]) 10/22/2010   s/p Left atrial maze procedure for paroxysmal atrial fibrillation on 09/20/2010 by Dr Prescott Gum.  Subsequent splenic infarct, decision was made to re-anticoagulate with coumadin, likely life-long as this is the most likely cause of the splenic infarct.   . Personal history of colonic polyps 05/14/2011   Colonoscopy (05/2011): 4 mm adenomatous polyp excised endoscopically Colonoscopy (02/2002):  Adenomatous polyp excised endoscopically   . Pulmonary hypertension due to chronic obstructive pulmonary disease ([place]) 04/25/2016   2014 TEE w PA peak pressure 46 mmHg, s/p MV replacement   . Right nephrolithiasis, asymptomatic, incidental finding 09/06/2014   5 mm non-obstructing calculus seen on CT scan 09/05/2014   . Right ventricular failure ([place]) 04/25/2016  . Severe obesity (BMI 35.0-39.9) with comorbidity ([place]) 10/23/2011  . Tobacco abuse 07/28/2012  . Type 2 diabetes mellitus with diabetic neuropathy [name])    Past Surgical History:  Procedure Laterality Date  . CARDIAC CATHETERIZATION    . CARDIAC VALVE REPLACEMENT  Aug. 2012   "mitral valve"  . CAROTID ENDARTERECTOMY Right 07/04/2010   by [name] for asymptomatic right carotid artery stenosis  . CATARACT EXTRACTION W/ INTRAOCULAR LENS  IMPLANT, BILATERAL Bilateral   .  CHEST TUBE INSERTION  09/24/2010   Dr Prescott Gum  . COLONOSCOPY  05/12/2011   performed by Dr. Michail Sermon. Showing small internal hemorrhoids, single tubular adenoma polyp  . CRYOABLATION Left 09/2011   by [name]. Followed by [name]  [facility]) .    Marland Kitchen DILATION AND CURETTAGE OF UTERUS    . ESOPHAGOGASTRODUODENOSCOPY  05/12/2011   performed by Dr. Michail Sermon. Negative for ulcerations, biopsy negative for evidence of celiac sprue  . ESOPHAGOGASTRODUODENOSCOPY N/A 06/29/2015   Procedure: ESOPHAGOGASTRODUODENOSCOPY (EGD);  Surgeon: Clarene Essex, MD;  Location: [facility] ENDOSCOPY;  Service: Endoscopy;  Laterality: N/A;  . ESOPHAGOGASTRODUODENOSCOPY N/A 03/29/2016   Procedure: ESOPHAGOGASTRODUODENOSCOPY (EGD);  Surgeon: Clarene Essex, MD;  Location: [facility] ENDOSCOPY;  Service: Endoscopy;  Laterality: N/A;  . FRACTURE SURGERY    . GIVENS CAPSULE STUDY N/A 06/30/2015   Procedure: GIVENS CAPSULE STUDY;  Surgeon: Clarene Essex, MD;  Location[facility] ENDOSCOPY;  Service: Endoscopy;  Laterality: N/A;  . GIVENS CAPSULE STUDY N/A 06/29/2015   Procedure: GIVENS CAPSULE STUDY;  Surgeon: Clarene Essex, MD;  Location: [place];  Service: Endoscopy;  Laterality: N/A;  . HEMORRHOID SURGERY  1970s?   "lanced"  . HYSTEROSCOPY W/ ENDOMETRIAL ABLATION  06/2001   for persistent post-menopausal bleeding // by S. [name], M.D.  . IR GENERIC HISTORICAL  08/23/2015   IR RADIOLOGIST EVAL & MGMT 08/23/2015 Aletta Edouard, MD [facility] INTERV RAD  . IR GENERIC HISTORICAL  04/09/2016   IR RADIOLOGIST EVAL & MGMT 04/09/2016 Aletta Edouard, MD [facility] INTERV RAD  . IR RADIOLOGIST EVAL & MGMT  10/07/2016  . IR RADIOLOGIST EVAL & MGMT  06/25/2017  . LEFT HEART CATH AND CORONARY ANGIOGRAPHY N/A 04/21/2016   Procedure: Left Heart Cath and Coronary Angiography;  Surgeon: Lorretta Harp, MD;  Location: [place] CV LAB;  Service: Cardiovascular;  Laterality: N/A;  . LIPOMA EXCISION  08/2005   occipital lipoma 1.5cm - by Dr. [name]  . LITHOTRIPSY  ~ 2000  . MAZE Left 09/20/10   for paroxysmal atrial fibrillation (Dr. Prescott Gum)  . MITRAL VALVE REPLACEMENT  09/20/10    with a 27-mm pericardial porcine valve (Medtronic Mosaic valve, serial #[id number]). 09/20/10, Dr Prescott Gum  . ORIF CLAVICLE FRACTURE Right 01/2004   by Thana Farr. [name], M.D for Right clavicle nonunion.; "it's got a pin in it"  . REFRACTIVE SURGERY Bilateral   . RIGHT HEART CATH N/A 04/23/2016   Procedure: Right Heart Cath;  Surgeon: [name], MD;  Location: [place] CV LAB;  Service: Cardiovascular;  Laterality: N/A;  . TONSILLECTOMY    . TUBAL LIGATION     Family History  Problem Relation Age of Onset  . Peptic Ulcer Disease Father   . Heart attack Father 57       Died of MI at age 16  . Heart attack Brother 41       Died of MI at age 52  . Obesity Brother   . Pneumonia Mother   . Healthy Sister   . Lupus Daughter   . Obsessive Compulsive Disorder Daughter    Social History   Socioeconomic History  . Marital status: Divorced    Spouse name: Not on file  . Number of children: Not on file  . Years of education: Not  on file  . Highest education level: Not on file  Occupational History  . Not on file  Tobacco Use  . Smoking status: Current Every Day Smoker    Packs/day: 1.00  Years: 55.00    Pack years: 55.00    Types: Cigarettes    Start date: 11/01/1962  . Smokeless tobacco: Never Used  Vaping Use  . Vaping Use: Never used  Substance and Sexual Activity  . Alcohol use: Not Currently    Comment: 02/19/2018 "nothing since 1999"  . Drug use: Not Currently    Types: Cocaine, Marijuana    Comment: 02/19/2018 "nothing since the 1990s"  . Sexual activity: Not Currently    Birth control/protection: Post-menopausal  Other Topics Concern  . Not on file  Social History Narrative   Lives alone in [place] ([place])   Worked at [employer] for 18 years   No car   Social Determinants of Radio broadcast assistant Strain: Not on [employer] Insecurity: No Landscape architect  . Worried About Charity fundraiser in the Last Year: Never true  . Ran Out of Food in the Last Year: Never true  Transportation Needs: No Transportation Needs  . Lack of Transportation (Medical): No  . Lack of Transportation (Non-Medical): No  Physical Activity: Not on file  Stress: Not on file  Social Connections: Not on file  Intimate Partner Violence: Not on file   ROS-see HPI   + = positive Constitutional:    weight loss, night sweats, fevers, chills, fatigue, lassitude. HEENT:    headaches, difficulty swallowing, tooth/dental problems, sore throat,       sneezing, itching, ear ache, nasal congestion, post nasal drip, snoring CV:    chest pain, orthopnea, PND, swelling in lower extremities, anasarca,                                   dizziness, palpitations Resp:   +shortness of breath with exertion or at rest.                productive cough,   non-productive cough, coughing up of blood.              change in color of mucus.  wheezing.   Skin:    rash or lesions. GI:  No-   heartburn, indigestion,  abdominal pain, nausea, vomiting, diarrhea,                 change in bowel habits, loss of appetite GU: dysuria, change in color of urine, no urgency or frequency.   flank pain. MS:   joint pain, stiffness, decreased range of motion, back pain. Neuro-     nothing unusual Psych:  change in mood or affect.  depression or anxiety.   memory loss.  OBJ- Physical Exam      Arrival O2 sat today, room air , wheelchair 97% General- Alert, Oriented, Affect-appropriate, Distress- none acute, +odor of tobacco, + wheelchair, + room air Skin- rash-none, lesions- none, excoriation- none Lymphadenopathy- none Head- atraumatic            Eyes- Gross vision intact, PERRLA, conjunctivae and secretions clear            Ears- Hearing, canals-normal            Nose- Clear, no-Septal dev, mucus, polyps, erosion, perforation             Throat- Mallampati IV , mucosa clear , drainage- none, tonsils- atrophic, + edentulous Neck- flexible , trachea midline, no stridor , thyroid nl, carotid no bruit Chest - symmetrical excursion , unlabored  Heart/CV- RRR , no murmur , no gallop  , no rub, nl s1 s2                           - JVD- none , edema- none, stasis changes- none, varices- none           Lung- clear to P&A, wheeze- none, cough+ , dullness-none, rub- none           Chest wall-  Abd-  Br/ Gen/ Rectal- Not done, not indicated Extrem- cyanosis- none, clubbing, none, atrophy- none, strength- nl Neuro- grossly intact to observation

## 2020-02-02 ENCOUNTER — Other Ambulatory Visit: Payer: Self-pay

## 2020-02-02 ENCOUNTER — Ambulatory Visit (INDEPENDENT_AMBULATORY_CARE_PROVIDER_SITE_OTHER): Payer: HMO

## 2020-02-02 ENCOUNTER — Encounter: Payer: Self-pay | Admitting: Internal Medicine

## 2020-02-02 ENCOUNTER — Ambulatory Visit (INDEPENDENT_AMBULATORY_CARE_PROVIDER_SITE_OTHER): Payer: HMO | Admitting: Internal Medicine

## 2020-02-02 ENCOUNTER — Telehealth: Payer: Self-pay | Admitting: Internal Medicine

## 2020-02-02 VITALS — BP 130/60 | HR 70 | Ht 62.0 in | Wt 166.8 lb

## 2020-02-02 DIAGNOSIS — I10 Essential (primary) hypertension: Secondary | ICD-10-CM

## 2020-02-02 DIAGNOSIS — I872 Venous insufficiency (chronic) (peripheral): Secondary | ICD-10-CM

## 2020-02-02 DIAGNOSIS — E1140 Type 2 diabetes mellitus with diabetic neuropathy, unspecified: Secondary | ICD-10-CM

## 2020-02-02 DIAGNOSIS — J449 Chronic obstructive pulmonary disease, unspecified: Secondary | ICD-10-CM | POA: Diagnosis not present

## 2020-02-02 DIAGNOSIS — I739 Peripheral vascular disease, unspecified: Secondary | ICD-10-CM | POA: Diagnosis not present

## 2020-02-02 DIAGNOSIS — G4734 Idiopathic sleep related nonobstructive alveolar hypoventilation: Secondary | ICD-10-CM

## 2020-02-02 DIAGNOSIS — I5032 Chronic diastolic (congestive) heart failure: Secondary | ICD-10-CM

## 2020-02-02 DIAGNOSIS — R918 Other nonspecific abnormal finding of lung field: Secondary | ICD-10-CM | POA: Insufficient documentation

## 2020-02-02 DIAGNOSIS — J811 Chronic pulmonary edema: Secondary | ICD-10-CM | POA: Diagnosis not present

## 2020-02-02 DIAGNOSIS — N183 Chronic kidney disease, stage 3 unspecified: Secondary | ICD-10-CM | POA: Diagnosis not present

## 2020-02-02 DIAGNOSIS — I130 Hypertensive heart and chronic kidney disease with heart failure and stage 1 through stage 4 chronic kidney disease, or unspecified chronic kidney disease: Secondary | ICD-10-CM | POA: Diagnosis not present

## 2020-02-02 DIAGNOSIS — Z794 Long term (current) use of insulin: Secondary | ICD-10-CM

## 2020-02-02 DIAGNOSIS — J439 Emphysema, unspecified: Secondary | ICD-10-CM

## 2020-02-02 DIAGNOSIS — G250 Essential tremor: Secondary | ICD-10-CM | POA: Diagnosis not present

## 2020-02-02 DIAGNOSIS — J9611 Chronic respiratory failure with hypoxia: Secondary | ICD-10-CM

## 2020-02-02 DIAGNOSIS — G4733 Obstructive sleep apnea (adult) (pediatric): Secondary | ICD-10-CM

## 2020-02-02 DIAGNOSIS — Z720 Tobacco use: Secondary | ICD-10-CM

## 2020-02-02 HISTORY — DX: Other nonspecific abnormal finding of lung field: R91.8

## 2020-02-02 MED ORDER — TRELEGY ELLIPTA 100-62.5-25 MCG/INH IN AEPB: 1.0000 | INHALATION_SPRAY | Freq: Every day | RESPIRATORY_TRACT | 0 refills | Status: DC

## 2020-02-02 NOTE — Assessment & Plan Note (Signed)
Blood sugars better controlled on the 36 units of Xultophy, though she did not download her continuous monitor prior to today's visit.  She did have an episode this morning of CBG 64 due to not eating dinner last night.  A1c next visit

## 2020-02-02 NOTE — Assessment & Plan Note (Signed)
Formal ABIs resulted earlier this month.  Right ABI notable for moderate right lower extremity arterial disease based on posterior tibial artery pressure.  Left ABI within normal range.  Cardiologist Dr. Benjamine Mola had advise [place] to be evaluated by interventional vascular cardiologist Dr. Gwenlyn Found, though she wishes to postpone for now.  The pain that she described to me today was not claudication but rather neuropathy.  We will continue to observe.  Note she is not on antiplatelet therapy but does take Coumadin and is a high fall risk.  Hypertension, DM2, and hyperlipidemia management continue.  She is not motivated to stop smoking which is her primary risk factor at this time.

## 2020-02-02 NOTE — Telephone Encounter (Signed)
ATC patient.  LMTCB. 

## 2020-02-02 NOTE — Telephone Encounter (Signed)
CXR showed some thickening and markings in the left lung that are probably inflammation. We will get another chest at next visit to see if it clears up.

## 2020-02-02 NOTE — Assessment & Plan Note (Signed)
Dr. Keturah Barre has ordered a home sleep test.  She is hoping to qualify for a CPAP machine, as she derives benefit.  Last sleep study did not show obstructive sleep apnea.

## 2020-02-02 NOTE — Patient Instructions (Signed)
Order- CXR  Dx COPD mixed type  Order- samples x 2 Trelegy 100      Inhale 1 puff then rinse mouth, once daily   Try this instead of Advair and Spiriva.. When the samples run out, go back to using Advair and Spiriva.  Order- schedule home sleep test    Dx OSA, chronic respiratory failure with hypoxia. Don't wear your CPAP and oxygen the night of this home sleep test.  Please call for results of your sleep test about 2 weeks after it is done.

## 2020-02-02 NOTE — Assessment & Plan Note (Signed)
BP elevated today, though at recent visits has been well controlled.  Continue to observe before making changes.

## 2020-02-02 NOTE — Progress Notes (Signed)
Established Patient Office Visit  Subjective:  Patient ID: [name], female    DOB: Sep 15, 1950  Age: 69 y.o. MRN: [id number]  CC:  Chief Complaint  Patient presents with  . Follow-up    HPI [name] presents for scheduled monthly follow-up of chronic conditions.  She is having one of her better days today.  This morning had a new visit with pulmonologist Dr. Keturah Barre who has provided her with Trelegy inhaler samples which are to replace her Advair and Spiriva until the samples are completed.  After this, she was instructed to resume her pre-existing inhalers.  Chest x-ray was done today.  Order was placed for a home sleep study.  No new problems.  She has had no improvement with her tinea pedis, but on further questioning this is entirely due to her inability to apply cream to her feet herself.  Her sister has declined to assist.  She also feels that she is developing a new yeast infection in the left inguinal fold.  Severe pain continues primarily in the legs below the knees, and in the hands.  It is described as a numb hard pain which she notices primarily upon awakening.  The knee and ankle joints are not affected.  The pain is partially relieved within about 45 minutes of taking her opioid.      Outpatient Medications Prior to Visit  Medication Sig Dispense Refill  . albuterol (PROAIR HFA) 108 (90 Base) MCG/ACT inhaler Inhale 2 puffs into the lungs every 6 (six) hours as needed for shortness of breath. 18 g 5  . ALPRAZolam (XANAX) 1 MG tablet TAKE 1 TABLET BY MOUTH EVERY NIGHT AT BEDTIME AS NEEDED FOR INSOMNIA. MAY TAKE 1/2 TABLET BY MOUTH DURING THE DAY FOR ANXIETY 55 tablet 4  . benzonatate (TESSALON) 100 MG capsule Take 1 capsule (100 mg total) by mouth 3 (three) times daily as needed for cough. 90 capsule 11  . Blood Glucose Monitoring Suppl (ONETOUCH VERIO) W/DEVICE KIT 1 each by Does not apply route 4 (four) times daily. 1 kit 0  . buPROPion  (WELLBUTRIN) 75 MG tablet Take 1 tablet (75 mg total) by mouth 2 (two) times daily. 180 tablet 3  . chlorpheniramine (CHLOR-TRIMETON) 4 MG tablet Take 4 mg by mouth daily as needed for allergies.    . Continuous Blood Gluc Sensor (FREESTYLE LIBRE 2 SENSOR) MISC 1 each by Does not apply route 6 (six) times daily. To read 6 times daily with change of sensor every 2 weeks, 90 days worth with 3 refills 7 each 3  . DULoxetine (CYMBALTA) 60 MG capsule Take 1 capsule (60 mg total) by mouth 2 (two) times daily. 60 capsule 3  . empagliflozin (JARDIANCE) 10 MG TABS tablet Take 1 tablet (10 mg total) by mouth daily before breakfast. 30 tablet 3  . fluticasone (FLONASE) 50 MCG/ACT nasal spray INSTILL 2 SPRAYS INTO BOTH NOSTRILS DAILY 54 g 3  . Fluticasone-Salmeterol (ADVAIR) 500-50 MCG/DOSE AEPB INHALE 1 PUFF INTO THE LUNGS TWICE DAILY 180 each 3  . Fluticasone-Umeclidin-Vilant (TRELEGY ELLIPTA) 100-62.5-25 MCG/INH AEPB Inhale 1 puff into the lungs daily. 28 each 0  . furosemide (LASIX) 80 MG tablet TAKE 1 TABLET BY MOUTH EVERY MORNING AND TAKE 1/2 TABLET EVERY EVENING 16 tablet 3  . gabapentin (NEURONTIN) 300 MG capsule Take 2 capsules (600 mg total) by mouth 3 (three) times daily. 540 capsule 3  . glucose blood (ONETOUCH VERIO) test strip Use to check blood sugar 4  times daily. diag code E11.40. Insulin dependent 375 each 3  . HYDROcodone-acetaminophen (NORCO/VICODIN) 5-325 MG tablet Take 1-2 tablets by mouth every 6 (six) hours as needed for severe pain. 114 tablet 0  . hydrocortisone cream 1 % APPLY EXTERNALLY TO THE AFFECTED AREA TWICE DAILY 28.35 g 2  . Insulin Degludec-Liraglutide (XULTOPHY) 100-3.6 UNIT-MG/ML SOPN Inject 36 Units into the skin daily for 30 doses. 3 mL 5  . Insulin Pen Needle 32G X 4 MM MISC Use to inject insulin up to 6 times a day 500 each 3  . Insulin Syringe-Needle U-100 31G X 15/64" 0.5 ML MISC Use to inject insulin up to 4 times a day 400 each 3  . ipratropium-albuterol (DUONEB)  0.5-2.5 (3) MG/3ML SOLN INHALE THE CONTENTS OF 1 VIAL VIA NEBULIZER EVERY 6 HOURS AS NEEDED FOR SHORTNESS OF BREATH 360 mL 3  . Lancets Misc. (ACCU-CHEK FASTCLIX LANCET) KIT Check your blood 4 times a day dx code 250.00 insulin requiring 1 kit 2  . lidocaine (LIDODERM) 5 % Place 1 patch onto the skin every 12 (twelve) hours. Remove & Discard patch within 12 hours or as directed by MD 60 patch 2  . metoprolol succinate (TOPROL-XL) 50 MG 24 hr tablet Take 1 tablet (50 mg total) by mouth daily. Take with or immediately following a meal. 90 tablet 3  . nystatin (NYSTATIN) powder Apply 1 application topically 3 (three) times daily. 60 g 0  . omeprazole (PRILOSEC) 40 MG capsule Take 1 capsule (40 mg total) by mouth in the morning and at bedtime. 90 capsule 1  . OXYGEN Inhale 2 L into the lungs at bedtime.    . potassium chloride SA (KLOR-CON) 20 MEQ tablet Take 2 tabs by mouth every morning and 1 tab every evening to replace potassium lost from lasix use. 270 tablet 3  . PRESCRIPTION MEDICATION Inhale into the lungs at bedtime. CPAP    . rosuvastatin (CRESTOR) 20 MG tablet TAKE 1 TABLET BY MOUTH ONCE EVERY NIGHT AT BEDTIME 90 tablet 3  . SPIRIVA RESPIMAT 2.5 MCG/ACT AERS INHALE 2 PUFFS BY MOUTH EVERY DAY 4 each 3  . terbinafine (LAMISIL) 1 % cream Apply cream to clean and dry feet twice a day until gone.  Apply to toes, and to sides, heels, and soles of feet. 30 g 1  . warfarin (COUMADIN) 2.5 MG tablet Take 2.46m warfarin on We/Th/Sa/Su; 060mon Mondays; 1.2543m1/2 x 2.5mg38mblet) on Tu/Fri. 20 tablet 1      Objective:    Physical Exam  BP (!) 151/62 (BP Location: Right Arm, Patient Position: Sitting, Cuff Size: Small)   Pulse 64   Ht _0  (1.575 m)   Wt 168 lb 9.6 oz (76.5 kg)   SpO2 97%   BMI 30.84 kg/m  Wt Readings from Last 3 Encounters:  02/02/20 168 lb 9.6 oz (76.5 kg)  02/02/20 166 lb 12.8 oz (75.7 kg)  01/12/20 168 lb (76.2 kg)   Ms.  [place]ake and alert, conversational and  in a positive mood.  Head and bilateral hand tremors are noted consistent with benign familial tremor.  She is in no distress in sitting position in transport chair.  No JVD in sitting position.  Lungs with fine basilar rales posteriorly.  Heart regular rate and rhythm.  Left inguinal fold developing erythema well demarcated, no exudate.  Lower extremities without edema.  Very dusky feet, CR 3 seconds.  Dorsalis pedis and posterior tibial pulses are not palpable, which  is chronic.  Diffuse scaling of the skin is noted in a moccasin distribution bilaterally.  2 very tiny scabs on the lateral dorsal foot are appropriately healing, where her cat bit her and broke the skin.  No signs of inflammation or infection.    Assessment & Plan:   See problem based charting.  Ms.Aguinaldo is having one of her better days today; she is looking forward to her family gathering for the holiday and in particular is eagerly anticipating seeing her great granddaughter as me.  We had a good discussion today about the many chronic conditions she is living with, and the fear she has about developing lung cancer (she has declined a screening CT for this reason, as she is unprepared to deal with any positive findings).  We talked about whether or not she has considered what she would do if cancer was diagnosed.  At this time, she feels that she would not want to pursue treatment, as she has seen acquaintances suffer from the side effects.  It would be her hope that she could live out her life with symptom management, such as with hospice.  She does not feel that her family would support this choice, however.  I reminded her that this decision is only hers to make.  We will continue to have these conversations.  At this time we have not addressed CODE STATUS but she has been thinking about her future health and life expectancy.    Chronic conditions are currently reasonably well managed.  She will have A1c follow-up in January and will  be observing for any benefit from the new sample inhaler provided by pulmonologist today.  At next visit, we will discuss her pain management regimen, as it has not changed in some time and continues to be ineffective for control of symptoms, which is negatively impacting her mobility and quality of life.   Follow-up: Return in about 1 month (around 03/04/2020).    Angelica Pou, MD

## 2020-02-02 NOTE — Assessment & Plan Note (Signed)
General volume status appears to be chronically dehydrated based on mildly  reduced skin turgor, though faint bibasilar rales are noted on exam.  Asymptomatic.  No change in therapy at this time.

## 2020-02-02 NOTE — Assessment & Plan Note (Signed)
eGFR CKD stage IIIa.  Last BMP 12/2019, stable.

## 2020-02-02 NOTE — Assessment & Plan Note (Signed)
>>  ASSESSMENT AND PLAN FOR CHRONIC CHF (CONGESTIVE HEART FAILURE) (HCC) WRITTEN ON 02/02/2020 12:52 PM BY Miguel Aschoff, MD  General volume status appears to be chronically dehydrated based on mildly  reduced skin turgor, though faint bibasilar rales are noted on exam.  Asymptomatic.  No change in therapy at this time.

## 2020-02-02 NOTE — Patient Instructions (Signed)
[name], thank you for coming in today.  It was important that we spoke about consideration of a different approach to your pain management, as your current plan has not been effective.  We will get together in January and will focus our conversation on this topic.  Take care, stay well, and Happy Christmas!  Dr. [name]

## 2020-02-02 NOTE — Assessment & Plan Note (Signed)
No associated edema today.  Feet are chronically cyanotic.

## 2020-02-02 NOTE — Assessment & Plan Note (Signed)
Tremor noted to involve hands and head today, which is consistent with a benign familial tremor.  Any treatment for this (it is bothering [name]) will need to be considered in the context of treatment for [name] severe neuropathy to avoid adverse effects.  I will give this some thought.

## 2020-02-02 NOTE — Assessment & Plan Note (Signed)
Pulmonary consultation today (I am unsure if this was a routine follow-up or if she was rereferred) with Dr. Keturah Barre.  TV CXR obtained and she was provided with Trelegy Ellipta inhaler samples, which are to replace her existing Advair and Spiriva.  She was advised to continue the Trelegy until the supply runs out, then to return to the Advair and Spiriva.  He continues to smoke and is not motivated to quit as she continues to derive much enjoyment from it.

## 2020-02-06 ENCOUNTER — Telehealth: Payer: Self-pay | Admitting: Pharmacist

## 2020-02-06 ENCOUNTER — Telehealth (HOSPITAL_COMMUNITY): Payer: Self-pay | Admitting: Pharmacist

## 2020-02-06 ENCOUNTER — Telehealth: Payer: Self-pay | Admitting: Internal Medicine

## 2020-02-06 MED ORDER — TRELEGY ELLIPTA 100-62.5-25 MCG/INH IN AEPB: 1.0000 | INHALATION_SPRAY | Freq: Every day | RESPIRATORY_TRACT | 6 refills | Status: DC

## 2020-02-06 NOTE — Telephone Encounter (Signed)
Spoke with the pt and verified her pharm  Rx for trelegy was sent  Nothing further needed

## 2020-02-06 NOTE — Telephone Encounter (Signed)
Was provided PST FS POC INR results from the patient who performs weekly patient self testing. INR 1.5 (target 1.5 - 2.5) on 13.68m warfarin/wk. Will provide dose instructions to INCREASE from 13.738mwarfarin/wk TO 1568marfarin/wk. Repeat INR [id number]

## 2020-02-06 NOTE — Telephone Encounter (Signed)
Called and spoke with patient about CXR results per Dr. Annamaria Boots.  Patient states that she has already noticed a difference with the new inhaler that we gave her at her appointment on 12/23. Would like to know why she is supposed to switch back to her other inhalers after samples are done or if she can stay on Trelegy?  Dr. Annamaria Boots please advise    Order- samples x 2 Trelegy 100      Inhale 1 puff then rinse mouth, once daily   Try this instead of Advair and Spiriva.. When the samples run out, go back to using Advair and Spiriva.

## 2020-02-06 NOTE — Addendum Note (Signed)
Addended by: Rosana Berger on: 02/06/2020 10:42 AM   Modules accepted: Orders

## 2020-02-06 NOTE — Telephone Encounter (Signed)
Please see telephone note from 12/23. Will close this encounter.

## 2020-02-06 NOTE — Telephone Encounter (Signed)
Received message that PA for Jardiance was required. Attempted to complete PA on CoverMyMeds. Per CoverMyMeds, the Vania Rea is available without an authorization so no PA is required.   Audry Riles, PharmD, BCPS, BCCP, CPP [facility] Failure [facility] Pharmacist [phone]

## 2020-02-06 NOTE — Telephone Encounter (Signed)
We can certainly send a script for Trelegy 100,  # 60, inhale 1 puff then rinse mouth, once daily refill x 6 This will replace her Advair and Spiriva

## 2020-02-07 ENCOUNTER — Telehealth: Payer: Self-pay

## 2020-02-07 ENCOUNTER — Telehealth: Payer: Self-pay | Admitting: Internal Medicine

## 2020-02-07 DIAGNOSIS — G8929 Other chronic pain: Secondary | ICD-10-CM

## 2020-02-07 DIAGNOSIS — J301 Allergic rhinitis due to pollen: Secondary | ICD-10-CM

## 2020-02-07 DIAGNOSIS — F419 Anxiety disorder, unspecified: Secondary | ICD-10-CM

## 2020-02-07 DIAGNOSIS — M545 Low back pain, unspecified: Secondary | ICD-10-CM

## 2020-02-07 IMAGING — DX DG LUMBAR SPINE COMPLETE 4+V
5 series · 5 of 5 positions shown · non-contrast
Comparison: MRI of the abdomen June 25, 2017

CLINICAL DATA: Multiple falls.

EXAM:
LUMBAR SPINE - COMPLETE 4+ VIEW

[l-spine ap]
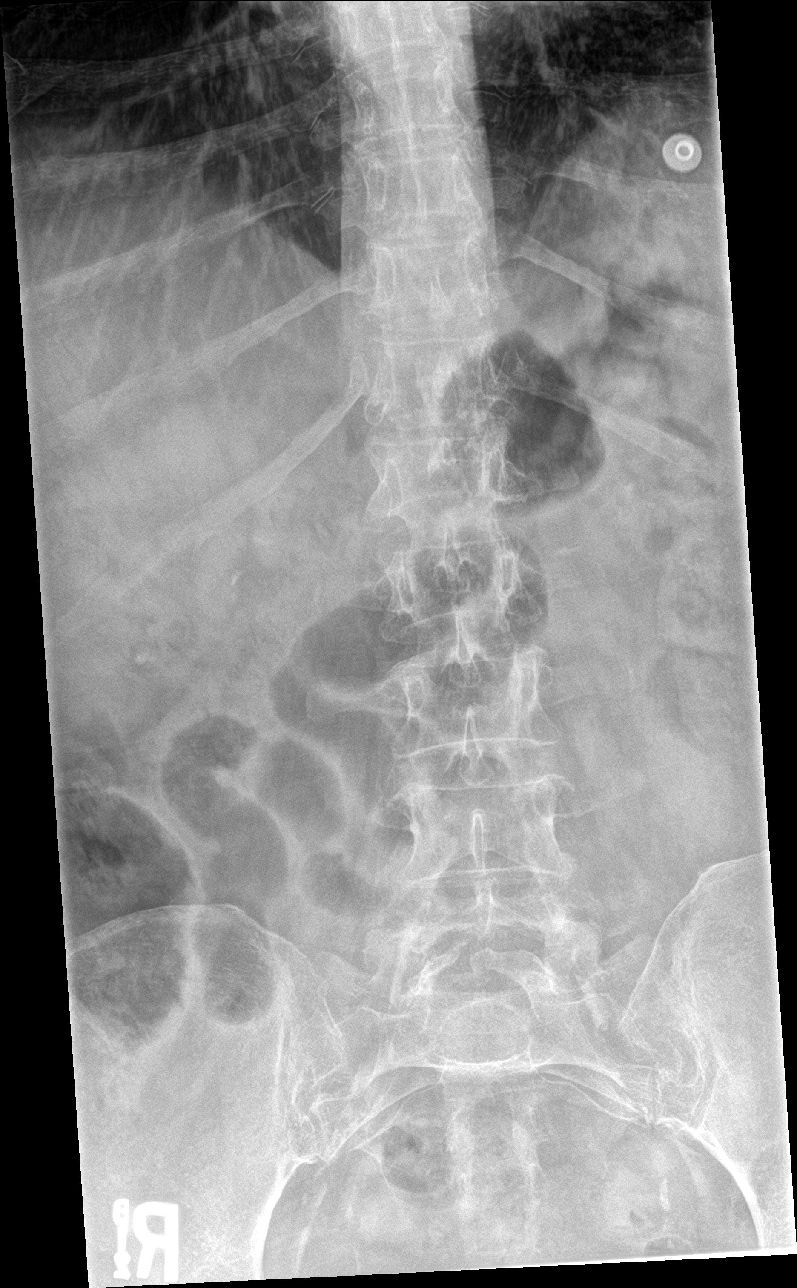

[l-spine obl (1 of 2)]
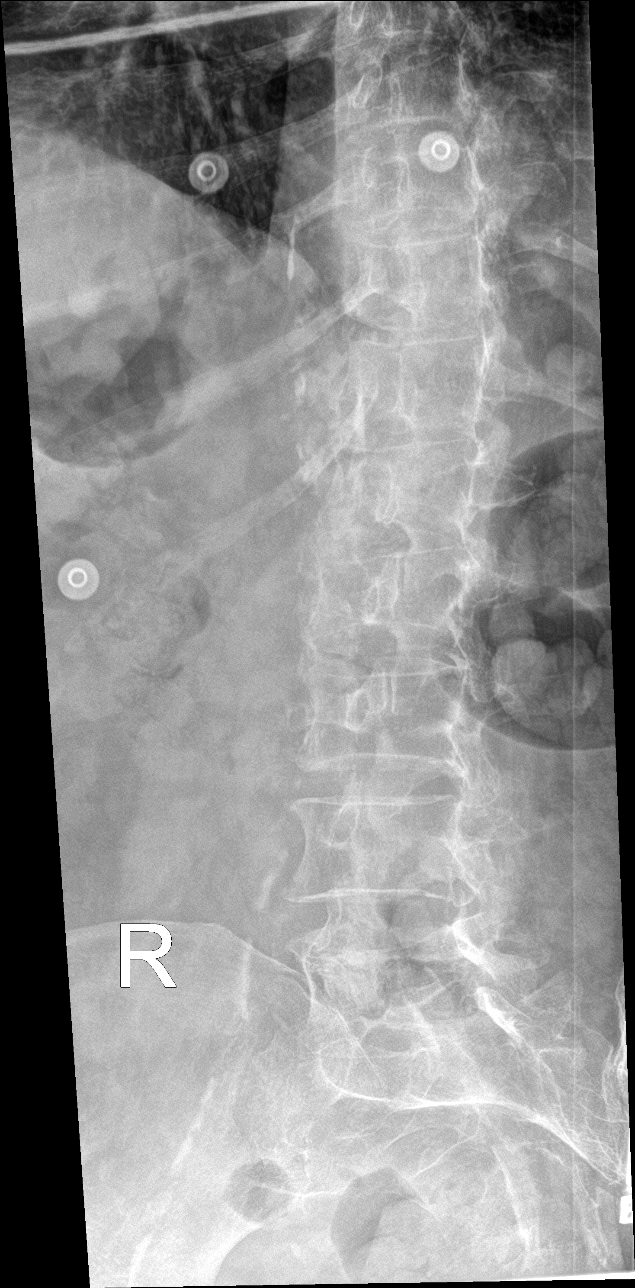

[l-spine obl (2 of 2)]
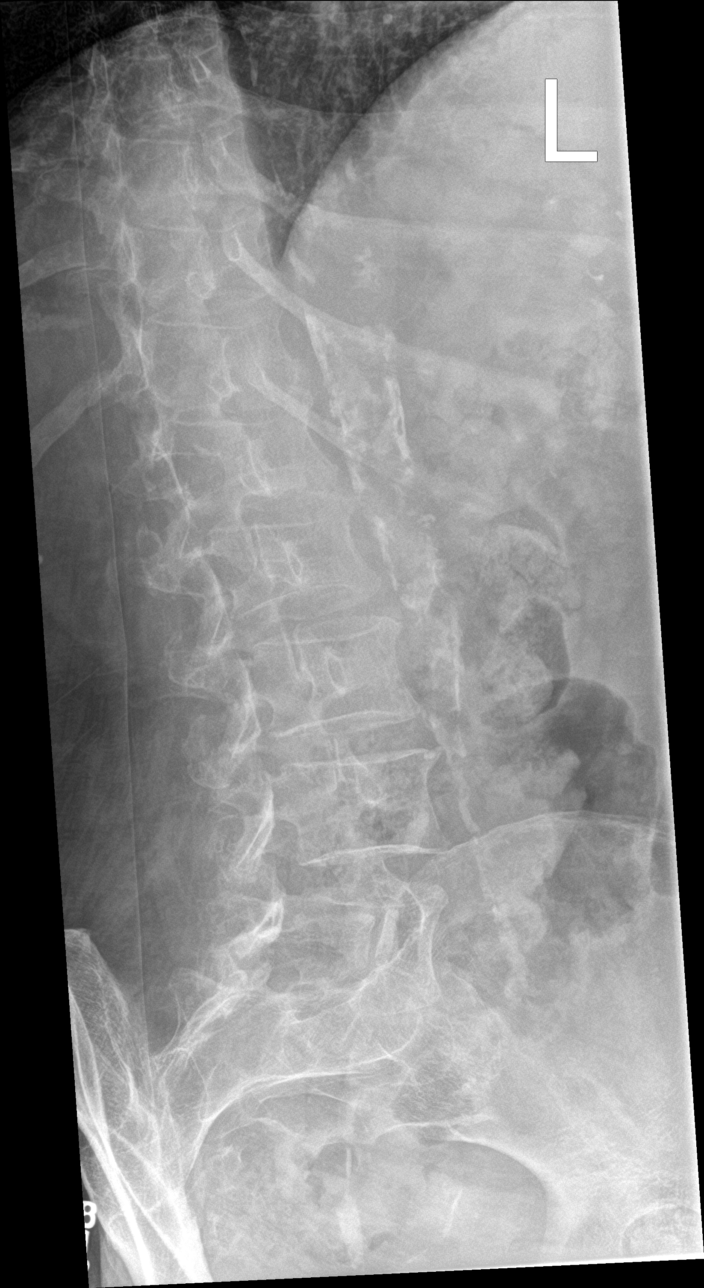

[l-spine lat]
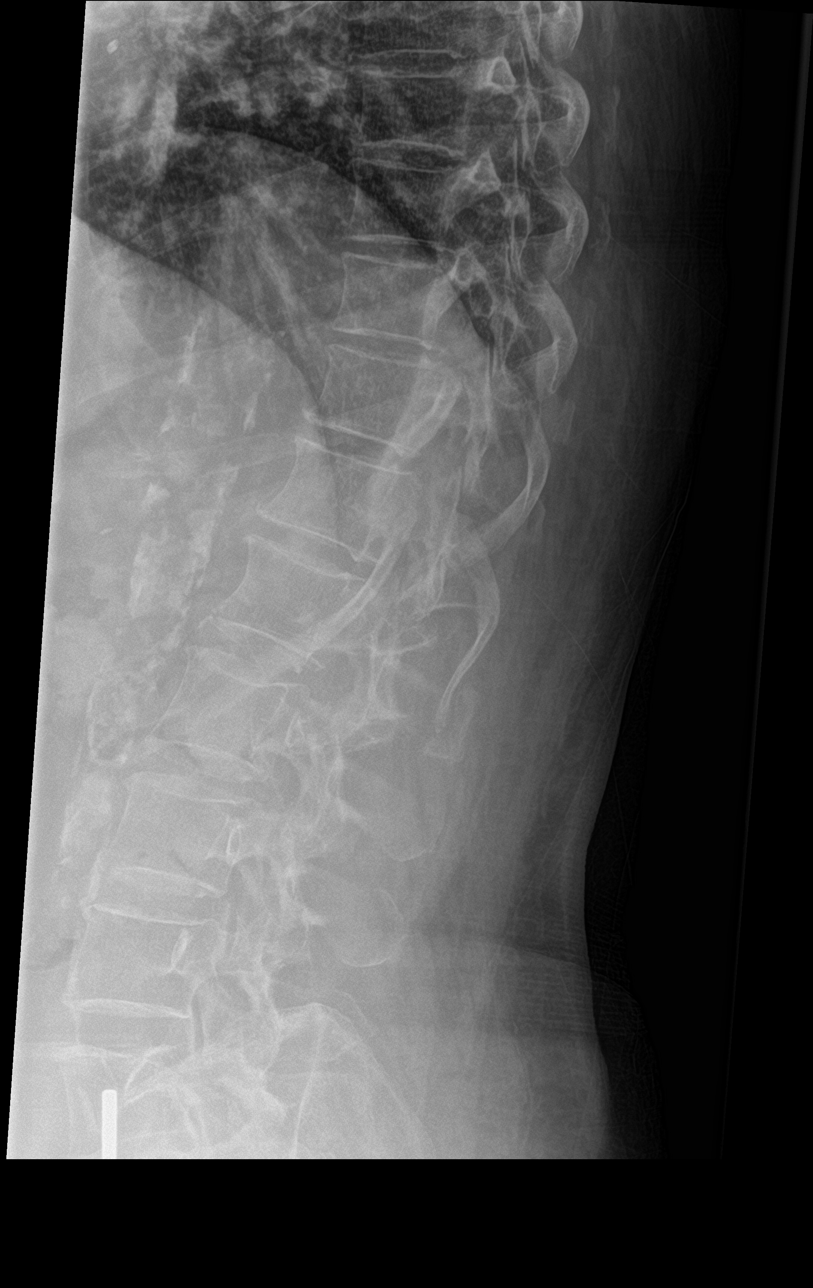

[l-spine spot]
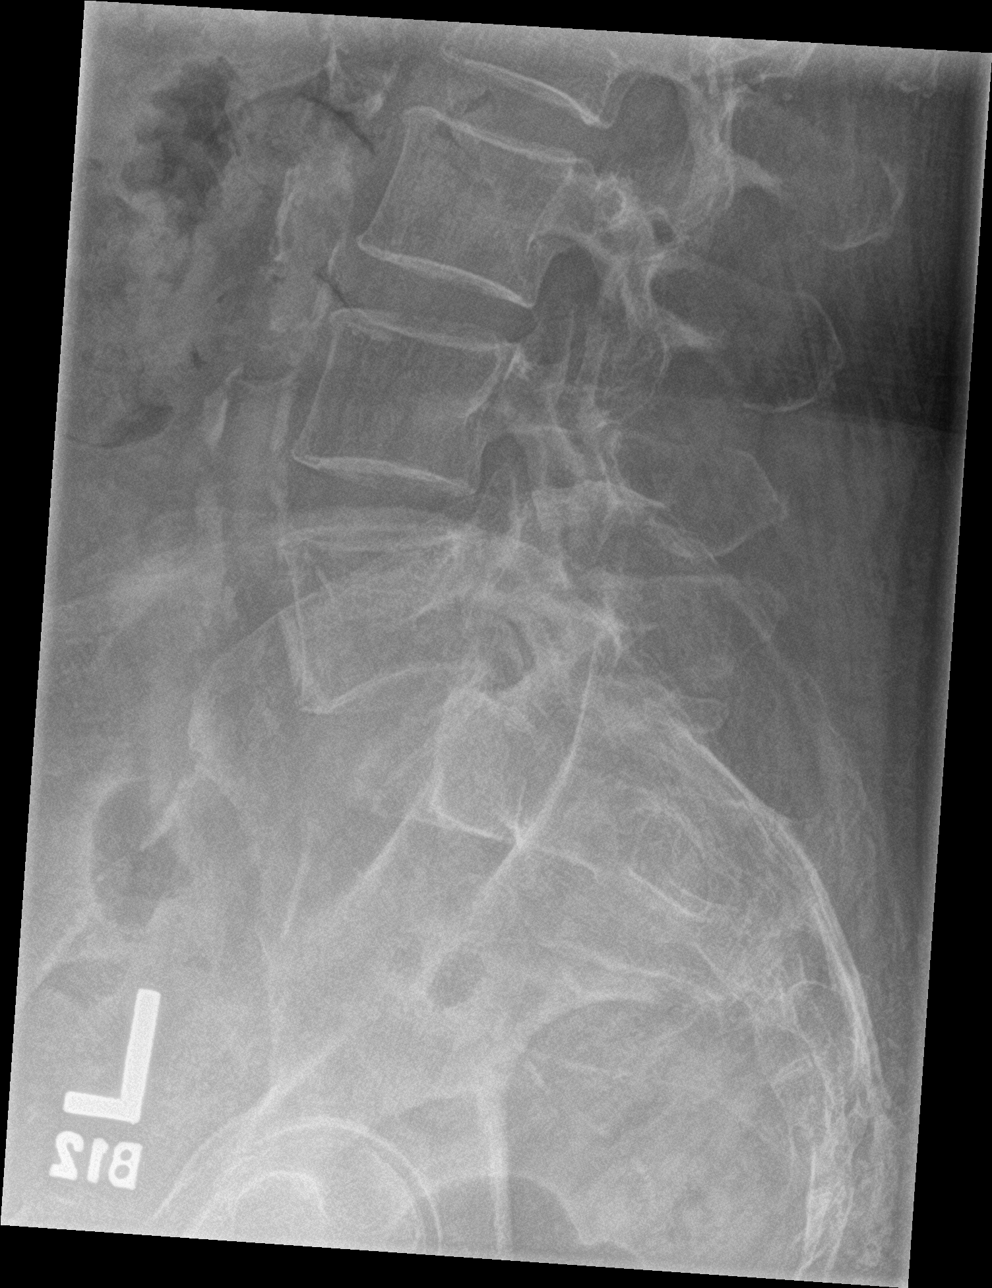

[5 of 5 positions shown; findings below may reference images not displayed]

FINDINGS: There is no evidence of lumbar spine fracture. Alignment is normal.
Osteopenia. Intervertebral disc spaces are maintained. Calcific
atherosclerosis aortoiliac vessels.
IMPRESSION: 1. No acute fracture deformity or malalignment. No advanced
degenerative change for age.
2.  Aortic Atherosclerosis ([id number]).

## 2020-02-07 IMAGING — CT CT CERVICAL SPINE W/O CM
4 of 8 series · 10 of 33 positions shown, 11 images · non-contrast
Comparison: Head CT 04/18/2016 and head and cervical spine CT
05/23/2011

CLINICAL DATA: Multiple recent falls as patient is on Coumadin.

EXAM:
CT HEAD WITHOUT CONTRAST
CT CERVICAL SPINE WITHOUT CONTRAST
TECHNIQUE: Multidetector CT imaging of the head and cervical spine was
performed following the standard protocol without intravenous
contrast. Multiplanar CT image reconstructions of the cervical spine
were also generated.

[Series 5: head bone · axial · 0.39mm/px · z∈[-80,-22]mm · 2 of 88 slices shown]
[im 30/88  bone]
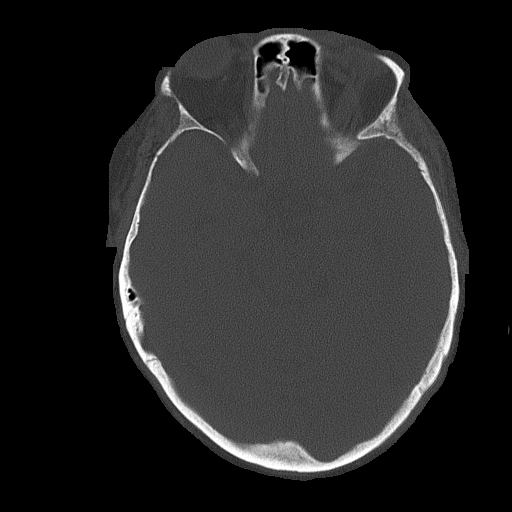
[im 59/88  bone]
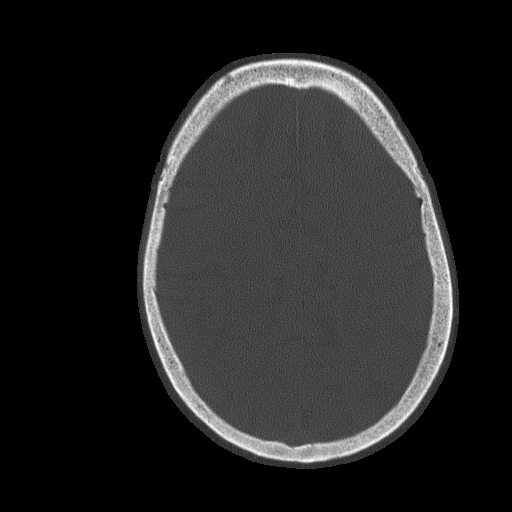

[Series 10: c_spine 2.0 sag bone · sagittal · 0.30mm/px · 4 of 61 slices shown]
[im 13/61  bone]
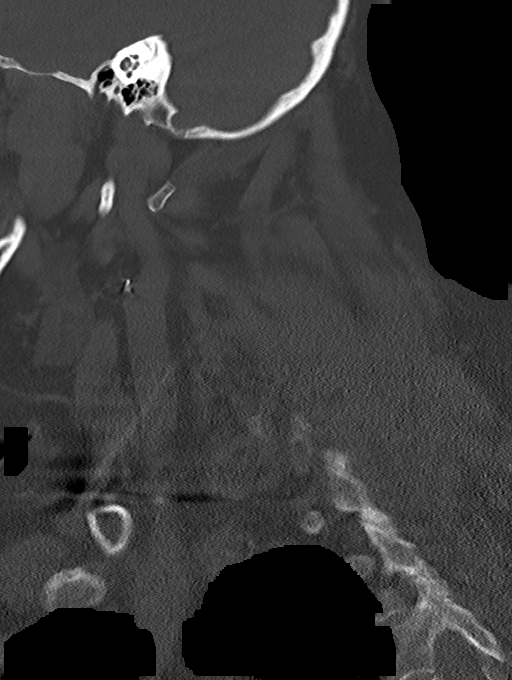
[im 25/61  bone]
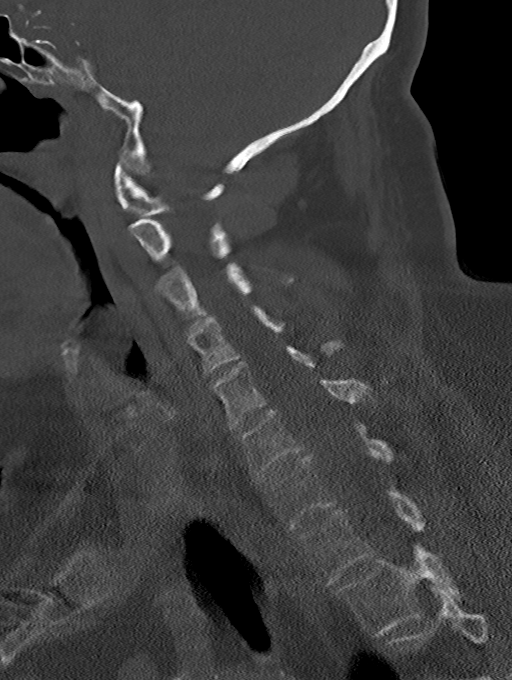
[im 37/61  bone]
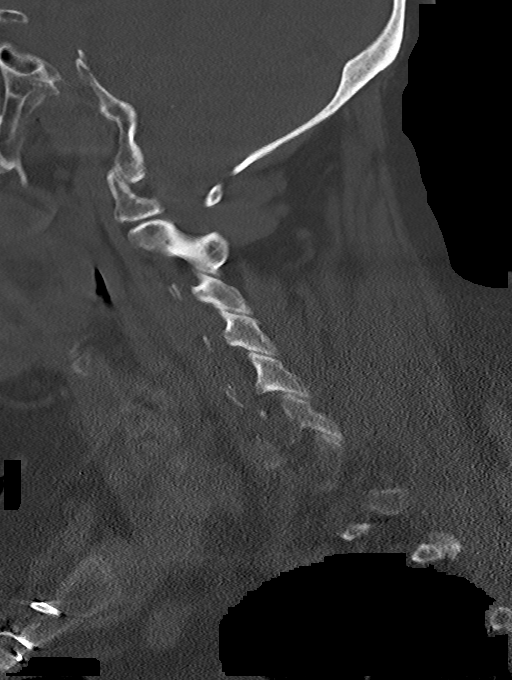
[im 49/61  bone]
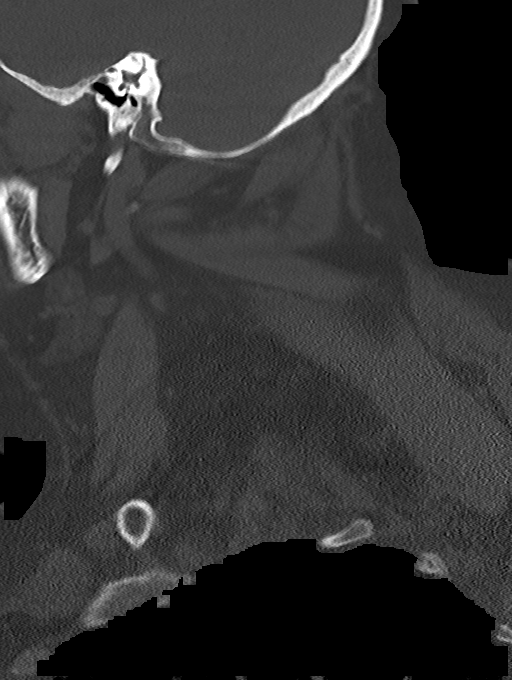

[Series 11: c_spine 2.0 cor bone · coronal · 0.30mm/px · 1 of 61 slices shown]
[im 31/61  bone]
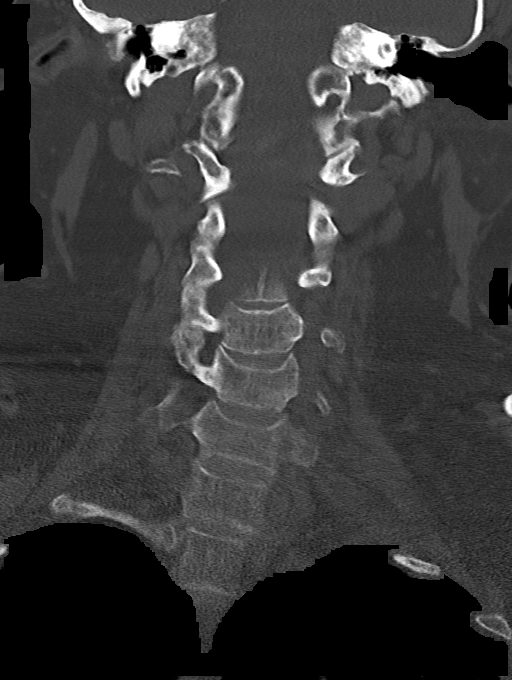

[Series 15: c_spine 2.0 st · axial · 0.47mm/px · z∈[-242,-138]mm · 3 of 104 slices shown, 4 images]
[im 26/104  soft-tissue]
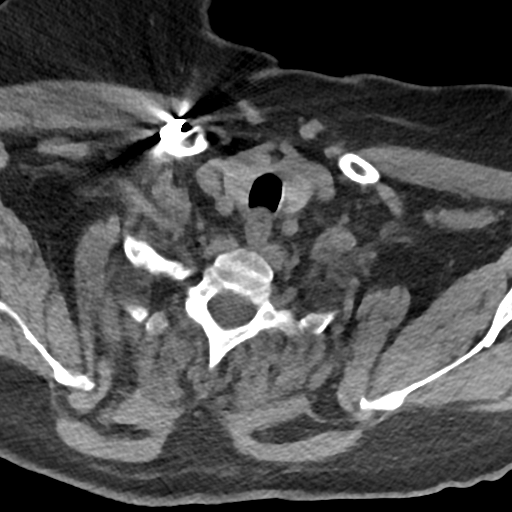
[im 26/104  bone]
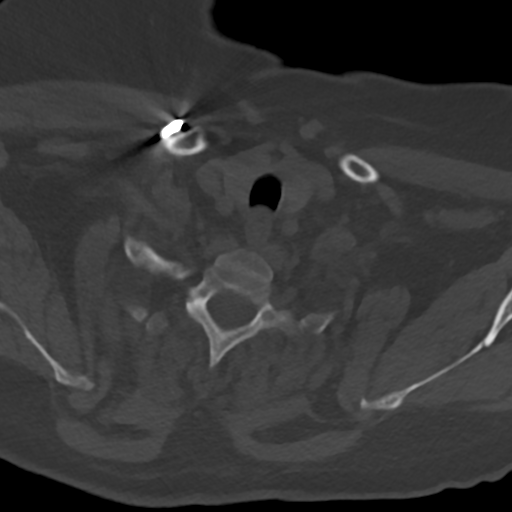
[im 52/104  bone]
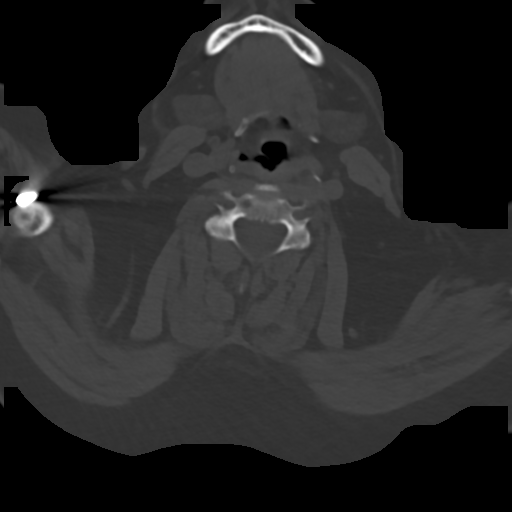
[im 78/104  bone]
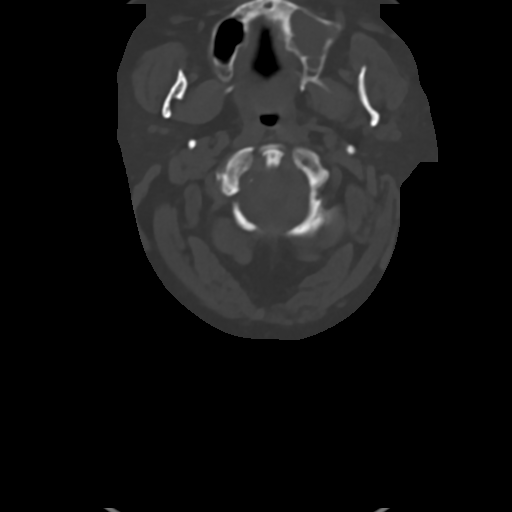

[10 of 33 positions shown; findings below may reference images not displayed]

FINDINGS: CT HEAD FINDINGS

Brain: Ventricles, cisterns and other CSF spaces are normal. There
is no mass, mass effect, shift of midline structures or acute
hemorrhage. No evidence of acute infarction. Minimal basal ganglia
calcifications.

Vascular: No hyperdense vessel or unexpected calcification.

Skull: Normal. Negative for fracture or focal lesion.

Sinuses/Orbits: Orbits are normal. There is moderate opacification
over the left maxillary sinus.

Other: None.

CT CERVICAL SPINE FINDINGS

Alignment: Normal.

Skull base and vertebrae: Mild spondylosis throughout the cervical
spine. Vertebral body heights are maintained. Atlantoaxial
articulation is within normal. No significant neural foraminal
narrowing. No acute fracture or subluxation.

Soft tissues and spinal canal: No prevertebral fluid or swelling. No
visible canal hematoma.

Disc levels:  Subtle disc space narrowing at the C5-6 level.

Upper chest: Negative.

Other: None.
IMPRESSION: No acute brain injury.

No acute cervical spine injury.

Mild spondylosis of the cervical spine with minimal disc disease at
the C5-6 level.

Chronic inflammatory change of the left maxillary sinus.

## 2020-02-07 IMAGING — DX DG CHEST 2V
2 series · 3 of 3 positions shown · non-contrast
Comparison: 04/22/2016 chest radiograph

CLINICAL DATA: 67 y/o  F; shortness of breath.

EXAM:
CHEST - 2 VIEW

[Series 2: chest lat · 0.14mm/px · 2 of 2 slices shown]
[im 1/2]
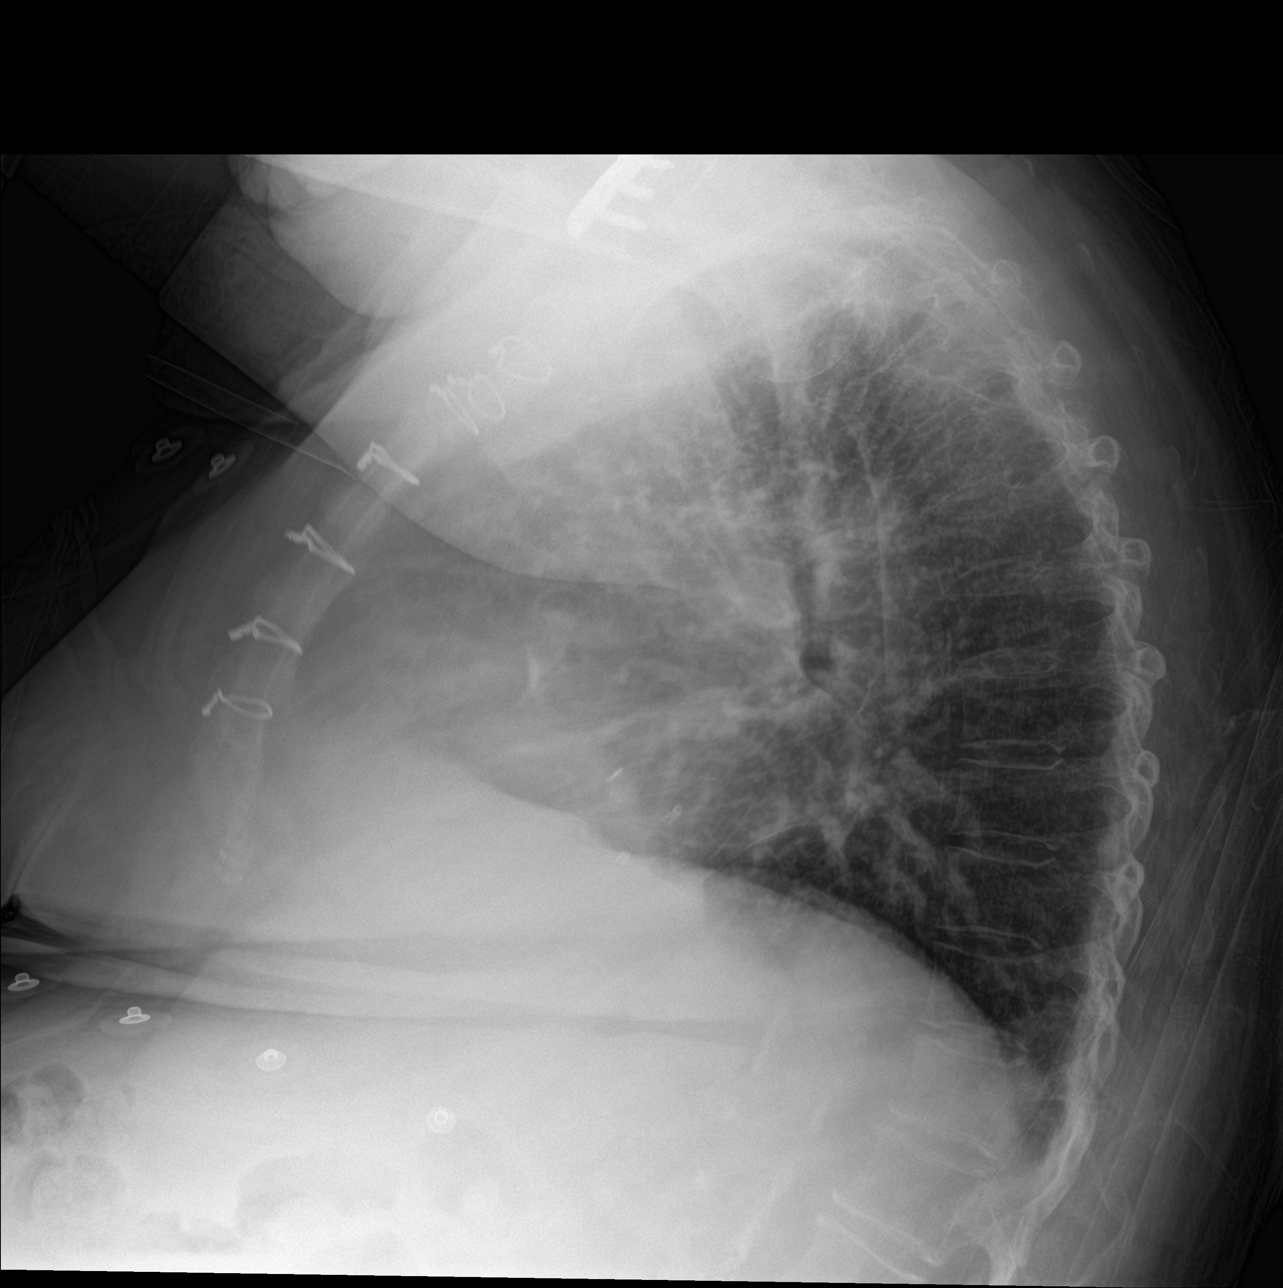
[im 2/2]
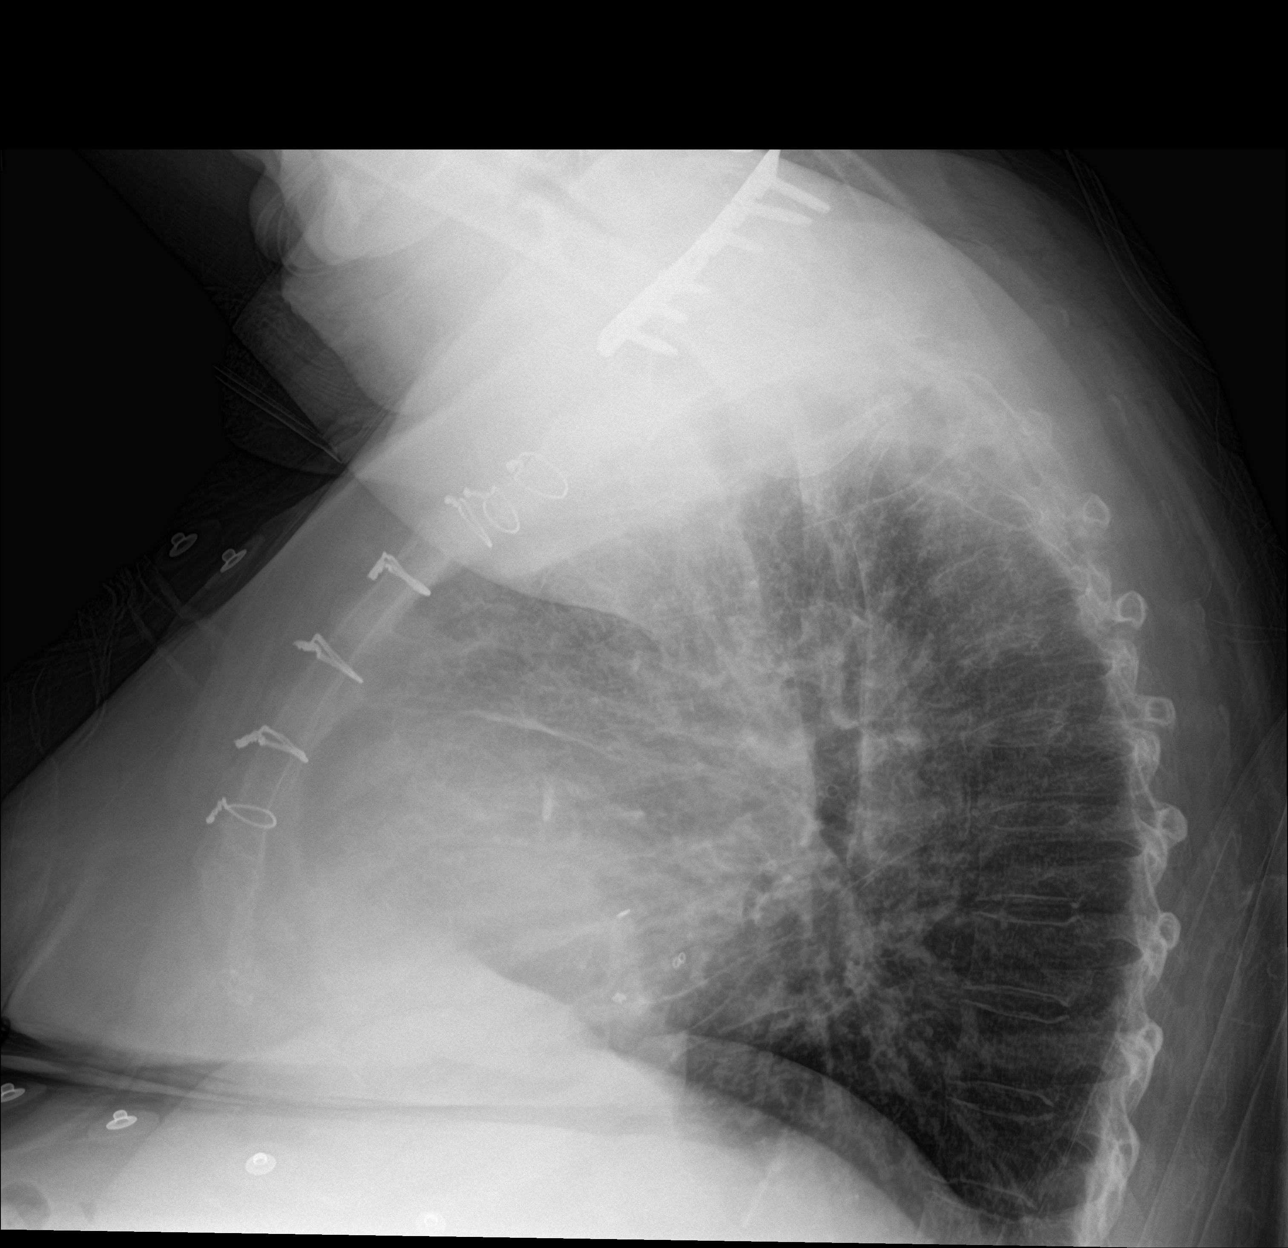

[chest ap]
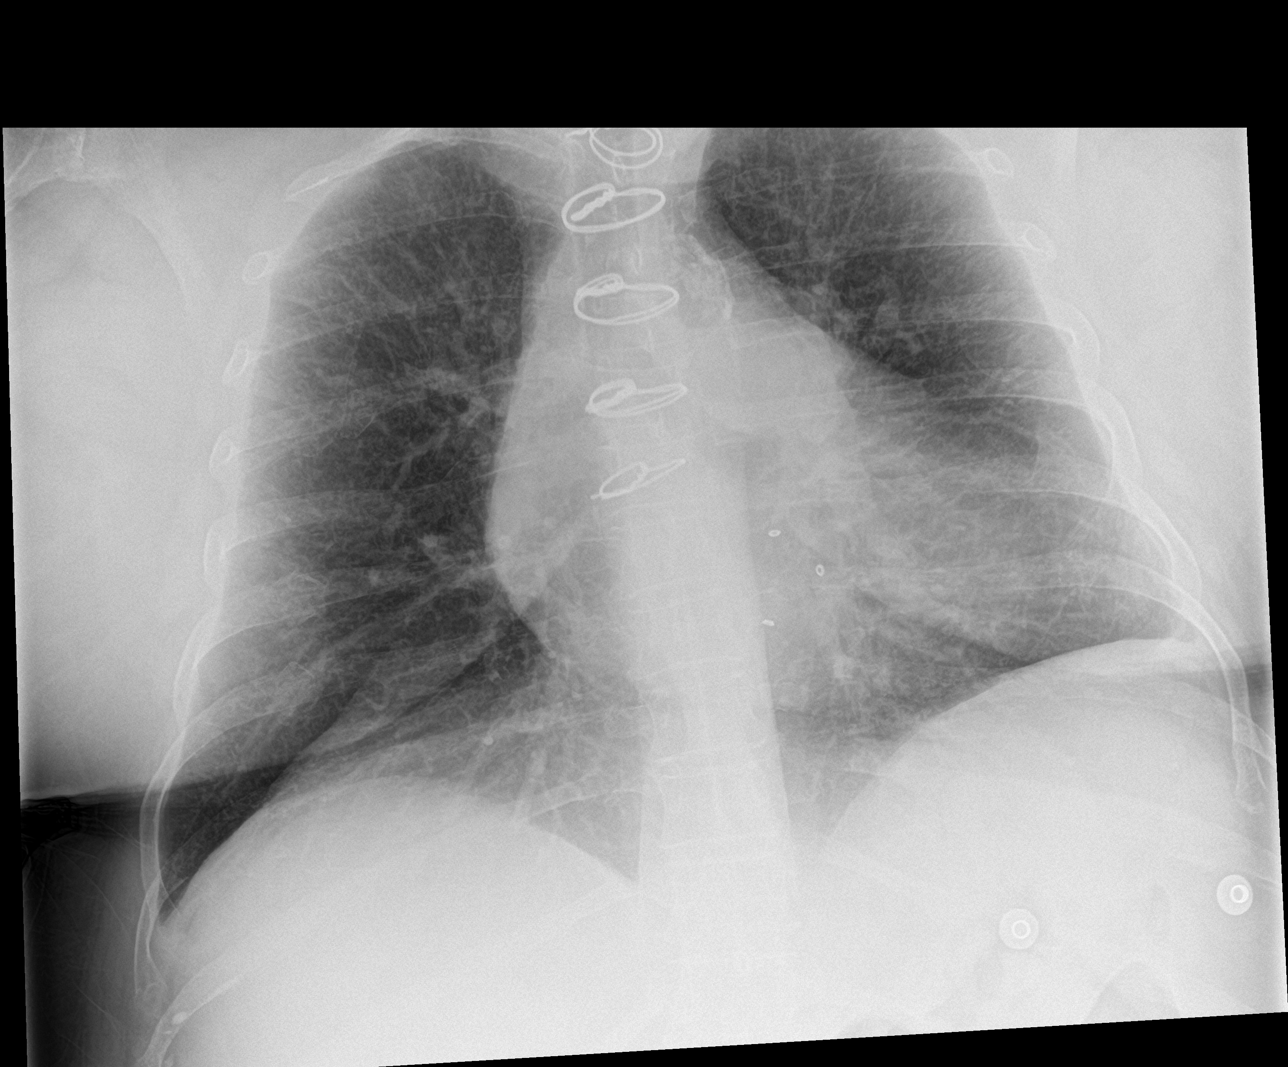

[3 of 3 positions shown; findings below may reference images not displayed]

FINDINGS: Stable cardiac silhouette given projection and technique. Sternotomy
wires are aligned. Aortic atherosclerosis with calcification.
Partially visualized right clavicle fixation plate. Pulmonary venous
hypertension. Probable mild interstitial edema. No consolidation. No
pleural effusion or pneumothorax. No acute osseous abnormality is
evident.
IMPRESSION: Pulmonary venous hypertension and probable mild interstitial edema.
No consolidation.

By: Car Staten M.D.

## 2020-02-07 MED ORDER — TRELEGY ELLIPTA 100-62.5-25 MCG/INH IN AEPB: 1.0000 | INHALATION_SPRAY | Freq: Every day | RESPIRATORY_TRACT | 6 refills | Status: DC

## 2020-02-07 NOTE — Telephone Encounter (Signed)
I called and spoke with pt and she stated that the pharmacy did not have the rx for the trelegy.  I explained that CY had sent this to the pharmacy that she requested and I have resent this to the pharmacy.  Pt was advised to call us back if she has any issues.

## 2020-02-07 NOTE — Telephone Encounter (Signed)
Please call pt back.

## 2020-02-07 NOTE — Telephone Encounter (Signed)
Called pt -requesting Hydrocodone refill sent to [facility] ; and refills on flonase and xanax sent to [facility]. Xanax was refilled 11/4 x 4 RF's sent to [facility] which I called to cancel further refills, spoke to [place].

## 2020-02-13 ENCOUNTER — Other Ambulatory Visit: Payer: Self-pay | Admitting: Dietician

## 2020-02-13 ENCOUNTER — Telehealth (INDEPENDENT_AMBULATORY_CARE_PROVIDER_SITE_OTHER): Payer: HMO | Admitting: Pharmacist

## 2020-02-13 DIAGNOSIS — Z7901 Long term (current) use of anticoagulants: Secondary | ICD-10-CM | POA: Diagnosis not present

## 2020-02-13 DIAGNOSIS — I480 Paroxysmal atrial fibrillation: Secondary | ICD-10-CM | POA: Diagnosis not present

## 2020-02-13 DIAGNOSIS — E1140 Type 2 diabetes mellitus with diabetic neuropathy, unspecified: Secondary | ICD-10-CM

## 2020-02-13 DIAGNOSIS — I10 Essential (primary) hypertension: Secondary | ICD-10-CM

## 2020-02-13 MED ORDER — HYDROCODONE-ACETAMINOPHEN 5-325 MG PO TABS: 1.0000 | ORAL_TABLET | Freq: Four times a day (QID) | ORAL | 0 refills | Status: DC | PRN

## 2020-02-13 MED ORDER — FLUTICASONE PROPIONATE 50 MCG/ACT NA SUSP: 2.0000 | Freq: Every day | NASAL | 3 refills | Status: DC

## 2020-02-13 MED ORDER — ALPRAZOLAM 1 MG PO TABS: ORAL_TABLET | ORAL | 4 refills | Status: DC

## 2020-02-13 NOTE — Telephone Encounter (Signed)
Oh no!  I've been on vacation!  Are these important requests not forwarded to another prescriber who is here? :(

## 2020-02-13 NOTE — Telephone Encounter (Signed)
waiting for prescriptions to be filled from 12/28 and cgm papers to get a refill on sensors. Call transferred to triage and called [facility] again to request papers be faxed to our office- papers sent to incorrect fax again. Again corrected fax number and doctor's name.

## 2020-02-13 NOTE — Telephone Encounter (Signed)
No, but thank you for asking. Once I get CGM orders from [facility], you will need to sign them. I will let you know when that is.

## 2020-02-13 NOTE — Telephone Encounter (Signed)
Thank you. Is there anything I need to do right now? We have just now refilled her other requested refills (not DM related)  - I had been on vacation and her requests weren't forwarded to another prescriber, unfortunately.

## 2020-02-13 NOTE — Telephone Encounter (Signed)
Imprivata not working on Dr. Grace Bushy computer. I signed prescriptions after being reviewed by her.

## 2020-02-13 NOTE — Telephone Encounter (Signed)
Patient texted me her POC FS INR = 1.4 (target goal 1.5-2.5) based upon 50m total warfarin/week. Will increase to 16.259mwarfarin.

## 2020-02-14 ENCOUNTER — Other Ambulatory Visit: Payer: Self-pay

## 2020-02-14 ENCOUNTER — Other Ambulatory Visit: Payer: Self-pay | Admitting: Gastroenterology

## 2020-02-14 MED ORDER — FREESTYLE LIBRE 2 SENSOR MISC: 1.0000 | Freq: Every day | 3 refills | Status: DC

## 2020-02-14 NOTE — Telephone Encounter (Addendum)
Papers from [place] indicate her sensors can be e-scribed for 90 day prescription. Please sign and e-scribe 90 day prescription for her Freestyle G[place] 2 sensors? Thank you!

## 2020-02-20 ENCOUNTER — Other Ambulatory Visit (HOSPITAL_COMMUNITY): Payer: Self-pay | Admitting: Cardiology

## 2020-02-20 ENCOUNTER — Telehealth: Payer: Self-pay | Admitting: Pharmacist

## 2020-02-20 DIAGNOSIS — I5032 Chronic diastolic (congestive) heart failure: Secondary | ICD-10-CM

## 2020-02-20 NOTE — Telephone Encounter (Signed)
Patient  texted me at 1044h reporting PST FS POC INR 2.0 (target goal 1.5 - 2.5) on 16.43m warfarin/wk as:  2.59mon Su/Wed/Th/Fri;Sat; 1and 1/2 tablets x 2.36m23mn Tuesday. NO warfarin on Mondays.

## 2020-02-22 DIAGNOSIS — R309 Painful micturition, unspecified: Secondary | ICD-10-CM | POA: Diagnosis not present

## 2020-02-22 DIAGNOSIS — N760 Acute vaginitis: Secondary | ICD-10-CM | POA: Diagnosis not present

## 2020-02-23 ENCOUNTER — Telehealth: Payer: Self-pay

## 2020-02-23 NOTE — Telephone Encounter (Signed)
Opened in error. SChaplin, RN,BSN

## 2020-02-24 ENCOUNTER — Telehealth (HOSPITAL_COMMUNITY): Payer: Self-pay | Admitting: *Deleted

## 2020-02-24 NOTE — Telephone Encounter (Signed)
OK, can stay off.

## 2020-02-24 NOTE — Telephone Encounter (Signed)
Pt called stating she stopped Jardiance yesterday. Pt said it causes a yeast infection and with her already having Lichen sclerosus of female genitalia its unbearable. Pts GYN prescribed medication for the yeast infection but patient has decided to stop jardiance.    Routed to [place]

## 2020-02-28 ENCOUNTER — Telehealth: Payer: Self-pay | Admitting: Physician Assistant

## 2020-02-28 ENCOUNTER — Other Ambulatory Visit: Payer: Self-pay | Admitting: Physician Assistant

## 2020-02-28 DIAGNOSIS — I5032 Chronic diastolic (congestive) heart failure: Secondary | ICD-10-CM

## 2020-02-28 MED ORDER — FUROSEMIDE 80 MG PO TABS: ORAL_TABLET | ORAL | 1 refills | Status: DC

## 2020-02-28 NOTE — Telephone Encounter (Signed)
   Pt has run out of Lasix. Rx by mail order will not get there till the 25th.  Sent in rx to [facility].   Rosaria Ferries, PA-C 02/28/2020 7:47 PM

## 2020-02-29 ENCOUNTER — Ambulatory Visit: Payer: HMO | Admitting: Behavioral Health

## 2020-02-29 ENCOUNTER — Other Ambulatory Visit: Payer: Self-pay

## 2020-02-29 DIAGNOSIS — I509 Heart failure, unspecified: Secondary | ICD-10-CM | POA: Diagnosis not present

## 2020-02-29 DIAGNOSIS — F321 Major depressive disorder, single episode, moderate: Secondary | ICD-10-CM

## 2020-02-29 DIAGNOSIS — J449 Chronic obstructive pulmonary disease, unspecified: Secondary | ICD-10-CM | POA: Diagnosis not present

## 2020-02-29 DIAGNOSIS — I050 Rheumatic mitral stenosis: Secondary | ICD-10-CM | POA: Diagnosis not present

## 2020-02-29 NOTE — BH Specialist Note (Signed)
Integrated Behavioral Health via Telemedicine Visit  02/29/2020 [name] [id number]  Number of [place] visits: 1/6 Session Start time: 2:00pm  Session End time: 2:30pm Total time: 30  Referring Provider: Debera Lat, RD Patient/Family location: Pt in privacy at home [facility] Provider location: [facility] Office All persons participating in visit: Pt & Clinician Types of Service: Individual psychotherapy  I connected with [name] and/or [name] self by Telephone  (Video is Caregility application) and verified that I am speaking with the correct person using two identifiers.Discussed confidentiality: Yes   I discussed the limitations of telemedicine and the availability of in person appointments.  Discussed there is a possibility of technology failure and discussed alternative modes of communication if that failure occurs.  I discussed that engaging in this telemedicine visit, they consent to the provision of behavioral healthcare and the services will be billed under their insurance.  Patient and/or legal guardian expressed understanding and consented to Telemedicine visit: Yes   Presenting Concerns: Patient and/or family reports the following symptoms/concerns: anx/dep, chronic health conditions Duration of problem: several months; Severity of problem: mild  Patient and/or Family's Strengths/Protective Factors: Concrete supports in place (healthy food, safe environments, etc.)  Goals Addressed: Patient will: 1.  Reduce symptoms of: anxiety and depression  2.  Increase knowledge and/or ability of: healthy habits and adjustment to health status changes over the yrs  3.  Demonstrate ability to: Increase healthy adjustment to current life circumstances and Begin healthy grieving over loss  Progress towards Goals: Estb'd today in session: twice monthly sessions of psychotherapy  Interventions: Interventions utilized:   Intake/Assessment process Standardized Assessments completed: Not Needed  Patient and/or Family Response: Pt receptive to call today, agreeing to bi-monthly visits.  Assessment: Patient currently experiencing need for socialization.   Patient may benefit from twice monthly sessions.  Plan: 1. Follow up with behavioral health clinician on : Telehealth every 2 wks for 30 min 2. Behavioral recommendations: Move more as able at home 3. Referral(s): [place] (In Clinic)  I discussed the assessment and treatment plan with the patient and/or parent/guardian. They were provided an opportunity to ask questions and all were answered. They agreed with the plan and demonstrated an understanding of the instructions.   They were advised to call back or seek an in-person evaluation if the symptoms worsen or if the condition fails to improve as anticipated.  Donnetta Hutching, LMFT

## 2020-03-01 ENCOUNTER — Telehealth: Payer: Self-pay | Admitting: Dietician

## 2020-03-01 DIAGNOSIS — E119 Type 2 diabetes mellitus without complications: Secondary | ICD-10-CM | POA: Diagnosis not present

## 2020-03-01 DIAGNOSIS — H04123 Dry eye syndrome of bilateral lacrimal glands: Secondary | ICD-10-CM | POA: Diagnosis not present

## 2020-03-01 DIAGNOSIS — H05243 Constant exophthalmos, bilateral: Secondary | ICD-10-CM | POA: Diagnosis not present

## 2020-03-01 DIAGNOSIS — E1140 Type 2 diabetes mellitus with diabetic neuropathy, unspecified: Secondary | ICD-10-CM

## 2020-03-01 DIAGNOSIS — H53022 Refractive amblyopia, left eye: Secondary | ICD-10-CM | POA: Diagnosis not present

## 2020-03-01 DIAGNOSIS — H43813 Vitreous degeneration, bilateral: Secondary | ICD-10-CM | POA: Diagnosis not present

## 2020-03-01 DIAGNOSIS — Z961 Presence of intraocular lens: Secondary | ICD-10-CM | POA: Diagnosis not present

## 2020-03-01 LAB — HM DIABETES EYE EXAM

## 2020-03-01 MED ORDER — ONETOUCH VERIO VI STRP: ORAL_STRIP | 8 refills | Status: DC

## 2020-03-01 NOTE — Telephone Encounter (Signed)
[name] calls saying she would like to upload her cgm for [place] to review. Her blood sugar on the CGM read Hi on Saturday and she did not know why. She states her blood sugar later came down to ~339 on CGM  and her meter read 179 mg/dl. Her blood sugars were okay the day before that and have been fine since that day. Her meter test strips were expired.   Needs strips for meter sent to [facility] mail order for a smaller quantity. She will send [place] a message once she uploads.

## 2020-03-02 ENCOUNTER — Telehealth: Payer: Self-pay

## 2020-03-02 ENCOUNTER — Encounter: Payer: Self-pay | Admitting: *Deleted

## 2020-03-02 NOTE — Telephone Encounter (Signed)
Yes I'm so glad that you did! Thank you Stacee

## 2020-03-02 NOTE — Telephone Encounter (Signed)
lmtrc

## 2020-03-02 NOTE — Telephone Encounter (Signed)
Dr. [name],  I received a telephone call from Anderson Malta, a pharmacist with [facility] about the test strips you sent yesterday.  She is requesting a 90 day supply since they are a mail off pharmacy. I gave them a verbal for this, please let me know if that was ok. Thanks! Higinio Roger, RN,BSN

## 2020-03-05 ENCOUNTER — Telehealth: Payer: Self-pay | Admitting: Pharmacist

## 2020-03-05 NOTE — Telephone Encounter (Signed)
Patient texts me her PST POC FS INR value 2.4 (target 1.5 - 2.5) on 16.8m warfarin/wk. Will REDUCE dose per week to 137mwarfarin/wk. No bleeding symptoms/signs.

## 2020-03-06 ENCOUNTER — Telehealth: Payer: Self-pay | Admitting: Pharmacist

## 2020-03-06 NOTE — Telephone Encounter (Signed)
Patient reported PST FS POC INR results 24-JAN-22 of 2.40 (target 1.5 - 2.5). Will LEAVE AS IS, i.e. no change in dosing. Continue 1x2.29m warfarin tablet every day--EXCEPT on Mondays--OMIT doses on Mondays. Repeat INR 31-JAN-22. No bleeding reported.

## 2020-03-07 ENCOUNTER — Other Ambulatory Visit: Payer: Self-pay

## 2020-03-07 DIAGNOSIS — K449 Diaphragmatic hernia without obstruction or gangrene: Secondary | ICD-10-CM

## 2020-03-07 DIAGNOSIS — K219 Gastro-esophageal reflux disease without esophagitis: Secondary | ICD-10-CM

## 2020-03-07 MED ORDER — OMEPRAZOLE 40 MG PO CPDR: 40.0000 mg | DELAYED_RELEASE_CAPSULE | Freq: Two times a day (BID) | ORAL | 1 refills | Status: DC

## 2020-03-08 ENCOUNTER — Encounter: Payer: HMO | Admitting: Internal Medicine

## 2020-03-09 LAB — HM DIABETES EYE EXAM

## 2020-03-09 NOTE — Telephone Encounter (Signed)
If she is already watching carbs let's increase Xultophy.  I'll look at the record MOnday.  Thank you Butch Penny

## 2020-03-09 NOTE — Telephone Encounter (Addendum)
[name] says she uploaded her CGM and asks [place] to review. She reports no lows and some high blood sugars on current regemin. Report reviewed.  Last 2 weeks compared o the 2 weeks prior:  48% time in target has decreased to 36% time in target.  Average glucose has increased form 185 to 203 mg/dl Glucose management Indicator has increased form 7.7% to 8.2%. the lowest noted over that past 2 weeks was 125. No hypoglycemia on either report.  There are more spikes on the past 2 week repot that appear to be from meals. Could suggest decreasing carb intake vs increasing xultophy vs both. Will discuss with Dr. [name].

## 2020-03-11 ENCOUNTER — Encounter: Payer: Self-pay | Admitting: Internal Medicine

## 2020-03-11 NOTE — Assessment & Plan Note (Signed)
Pressed to start thinking seriously about quitting. If she is willing to try, then there is support for her.

## 2020-03-11 NOTE — Assessment & Plan Note (Signed)
Plan- emphasis on smoking cessation. Samples Trelegy 100 for trial

## 2020-03-11 NOTE — Assessment & Plan Note (Signed)
She has had documented mild to moderate OSA in past, treated with CPAP. Had lost some weight which may be why 2021 sleep study negative for OSA, although it did show hypoxemia. She already has and uses her own O2 at 2L.  Plan- Home sleep test on room air- off CPAP and off O2

## 2020-03-12 ENCOUNTER — Telehealth: Payer: Self-pay | Admitting: Pharmacist

## 2020-03-12 DIAGNOSIS — I480 Paroxysmal atrial fibrillation: Secondary | ICD-10-CM | POA: Diagnosis not present

## 2020-03-12 DIAGNOSIS — Z7901 Long term (current) use of anticoagulants: Secondary | ICD-10-CM | POA: Diagnosis not present

## 2020-03-12 NOTE — Telephone Encounter (Signed)
Patient texted me results of PST FS POC INR 2.2 (target 1.5 - 2.5) on 2.4m warfarin daily--EXCEPT on Mondays (she OMITS doses on Mondays). Will leave on same regimen. Denies any signs or symptoms of bleeding or embolic events. No new medications she states. Will re-check (as patient self testing) on Monday 7-FEB-22.

## 2020-03-14 ENCOUNTER — Ambulatory Visit: Payer: HMO | Admitting: Behavioral Health

## 2020-03-14 ENCOUNTER — Other Ambulatory Visit: Payer: Self-pay | Admitting: Internal Medicine

## 2020-03-14 ENCOUNTER — Other Ambulatory Visit: Payer: Self-pay

## 2020-03-14 DIAGNOSIS — Z794 Long term (current) use of insulin: Secondary | ICD-10-CM

## 2020-03-14 DIAGNOSIS — E1140 Type 2 diabetes mellitus with diabetic neuropathy, unspecified: Secondary | ICD-10-CM

## 2020-03-14 DIAGNOSIS — F419 Anxiety disorder, unspecified: Secondary | ICD-10-CM

## 2020-03-14 MED ORDER — XULTOPHY 100-3.6 UNIT-MG/ML ~~LOC~~ SOPN: 40.0000 [IU] | PEN_INJECTOR | Freq: Every day | SUBCUTANEOUS | 3 refills | Status: DC

## 2020-03-14 NOTE — Telephone Encounter (Signed)
Called patient per Dr. [name] order about a chang in her xultophy diose. [name] said she'd call us back later.

## 2020-03-14 NOTE — BH Specialist Note (Signed)
Integrated Behavioral Health via Telemedicine Visit  03/14/2020 [name] [id number]  Number of [place] visits: 2/6 Session Start time: 11:00am  Session End time: 11:30am Total time: 30  Referring Provider: Dr. Dorian Pod, MD Patient/Family location: Pt in private at home [facility] Provider location: [facility] Office All persons participating in visit: Pt & Clinician Types of Service: Individual psychotherapy  I connected with [name] and/or [name]'s self by Telephone  (Video is Caregility application) and verified that I am speaking with the correct person using two identifiers.Discussed confidentiality: Yes   I discussed the limitations of telemedicine and the availability of in person appointments.  Discussed there is a possibility of technology failure and discussed alternative modes of communication if that failure occurs.  I discussed that engaging in this telemedicine visit, they consent to the provision of behavioral healthcare and the services will be billed under their insurance.  Patient and/or legal guardian expressed understanding and consented to Telemedicine visit: Yes   Presenting Concerns: Patient and/or family reports the following symptoms/concerns: elevated anxiety due to lack of regular contact from Family members Duration of problem: months; Severity of problem: mild  Patient and/or Family's Strengths/Protective Factors: Social connections and Concrete supports in place (healthy food, safe environments, etc.)  Goals Addressed: Patient will: 1.  Reduce symptoms of: anxiety and depression  2.  Increase knowledge and/or ability of: coping skills  3.  Demonstrate ability to: Increase healthy adjustment to current life circumstances  Progress towards Goals: Ongoing  Interventions: Interventions utilized:  Solution-Focused Strategies and Supportive Counseling Standardized Assessments completed: Not  Needed  Patient and/or Family Response: Pt is receptive to call today for check-in  Assessment: Patient currently experiencing worry & concern due to lack of communication from older Str who assists her w/different tasks.  Patient may benefit from psychoedu about Family dynamics around cardiac health issues related to protectiveness. Issues w/health status changes due to COVID-19  Plan: 1. Follow up with behavioral health clinician on : 2 wks out for 30 min 2. Behavioral recommendations: Journal btwn visits about your concerns for our next session. Contact relatives prn for support. 3. Referral(s): [place] (In Clinic)  I discussed the assessment and treatment plan with the patient and/or parent/guardian. They were provided an opportunity to ask questions and all were answered. They agreed with the plan and demonstrated an understanding of the instructions.   They were advised to call back or seek an in-person evaluation if the symptoms worsen or if the condition fails to improve as anticipated.  Donnetta Hutching, LMFT

## 2020-03-15 ENCOUNTER — Telehealth: Payer: Self-pay | Admitting: Dietician

## 2020-03-15 NOTE — Telephone Encounter (Signed)
Yes that is good advice.

## 2020-03-15 NOTE — Telephone Encounter (Signed)
Notified patient of increased dose of 40 units xulophy daily.  She said she would start that today and upload her CGM in a week.  She asked about her xultophy dose for her colonoscopy and endoscopy procedures in April. I agreed with what they told her to take half her usual dose  the day before and when she gets home from the procedures to take her regular dose. Route to Dr. [name] for her input.

## 2020-03-15 NOTE — Telephone Encounter (Signed)
Lea called wanting to be sure her new dose of xultophy has been sent to [facility].

## 2020-03-19 ENCOUNTER — Telehealth: Payer: Self-pay | Admitting: Pharmacist

## 2020-03-19 NOTE — Telephone Encounter (Signed)
Received text from patient reporting PST FS POC INR is 2.6 today. Target INR 1.5 - 2.5 per physician. Will reduce dose from 68m/wk to 13.762mwk as:  1x2.48m24marfarin on Su/Wed/Thurs/Fri/Sat; 0mg57mblet(s) on Mondays; 1/2 x 2.48mg 62mTuesdays. Patient has been sent new instructions.

## 2020-03-20 ENCOUNTER — Other Ambulatory Visit: Payer: Self-pay | Admitting: Internal Medicine

## 2020-03-20 ENCOUNTER — Telehealth: Payer: Self-pay | Admitting: Internal Medicine

## 2020-03-20 DIAGNOSIS — M545 Low back pain, unspecified: Secondary | ICD-10-CM

## 2020-03-20 DIAGNOSIS — G8929 Other chronic pain: Secondary | ICD-10-CM

## 2020-03-20 NOTE — Telephone Encounter (Signed)
Called and spoke with pt and she said that she is going to get endo and colonoscopy soon and she is concerned about being put to sleep.  Is there a reason why she should not be put to sleep given the results of her cxr?  Her procedures are not till April.  Pt is just wanting to make sure it is safe to still have this done.  Pt stated that the trelegy helps her so much but she stated that she feels that this does not last and she has to take her rescue inhaler at times.  She did state that she can tell that she is breathing better.    Pt does have an appt with CY on 3/24   CY please advise.

## 2020-03-20 NOTE — Telephone Encounter (Signed)
Her CXR showed an area of possible mild inflammation which we will watch, anticipating repeat CXR at her next visit here. I do not expect it to interfere with her planned procedures.

## 2020-03-20 NOTE — Telephone Encounter (Signed)
Last rx written 02/13/20. Last OV 02/02/20. Next OV 04/12/20. UDS 12/16/18.

## 2020-03-20 NOTE — Telephone Encounter (Signed)
Refill Request   HYDROcodone-acetaminophen (NORCO/VICODIN) 5-325 MG tablet   [id number] - [place], [place] - [address] AT [address] (Ph: [phone])

## 2020-03-21 IMAGING — CR DG CHEST 2V
2 series · 2 of 2 positions shown · non-contrast
Comparison: 10/01/2017

CLINICAL DATA: COPD bronchitis.  Worsening shortness of breath.

EXAM:
CHEST - 2 VIEW

[chest pa]
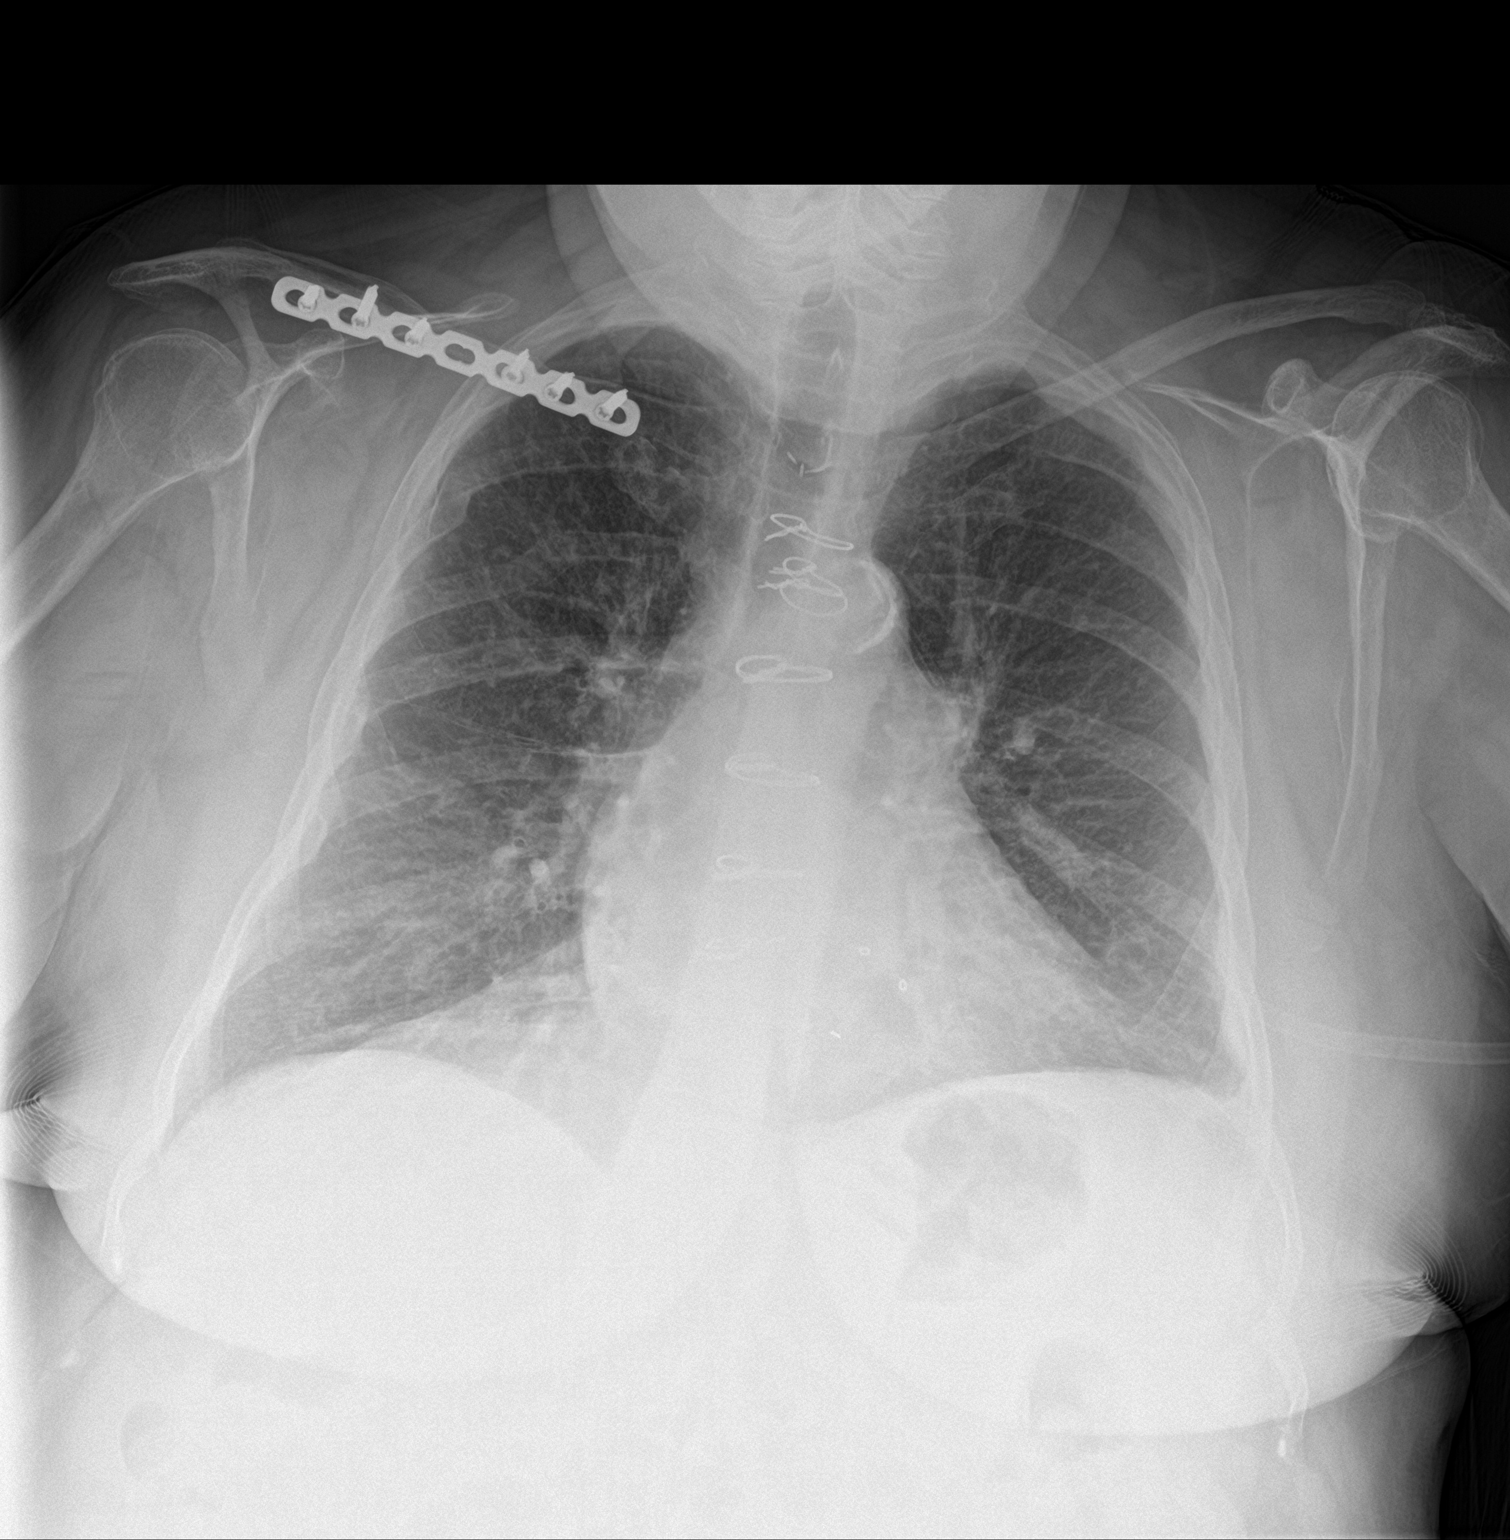

[chest lat]
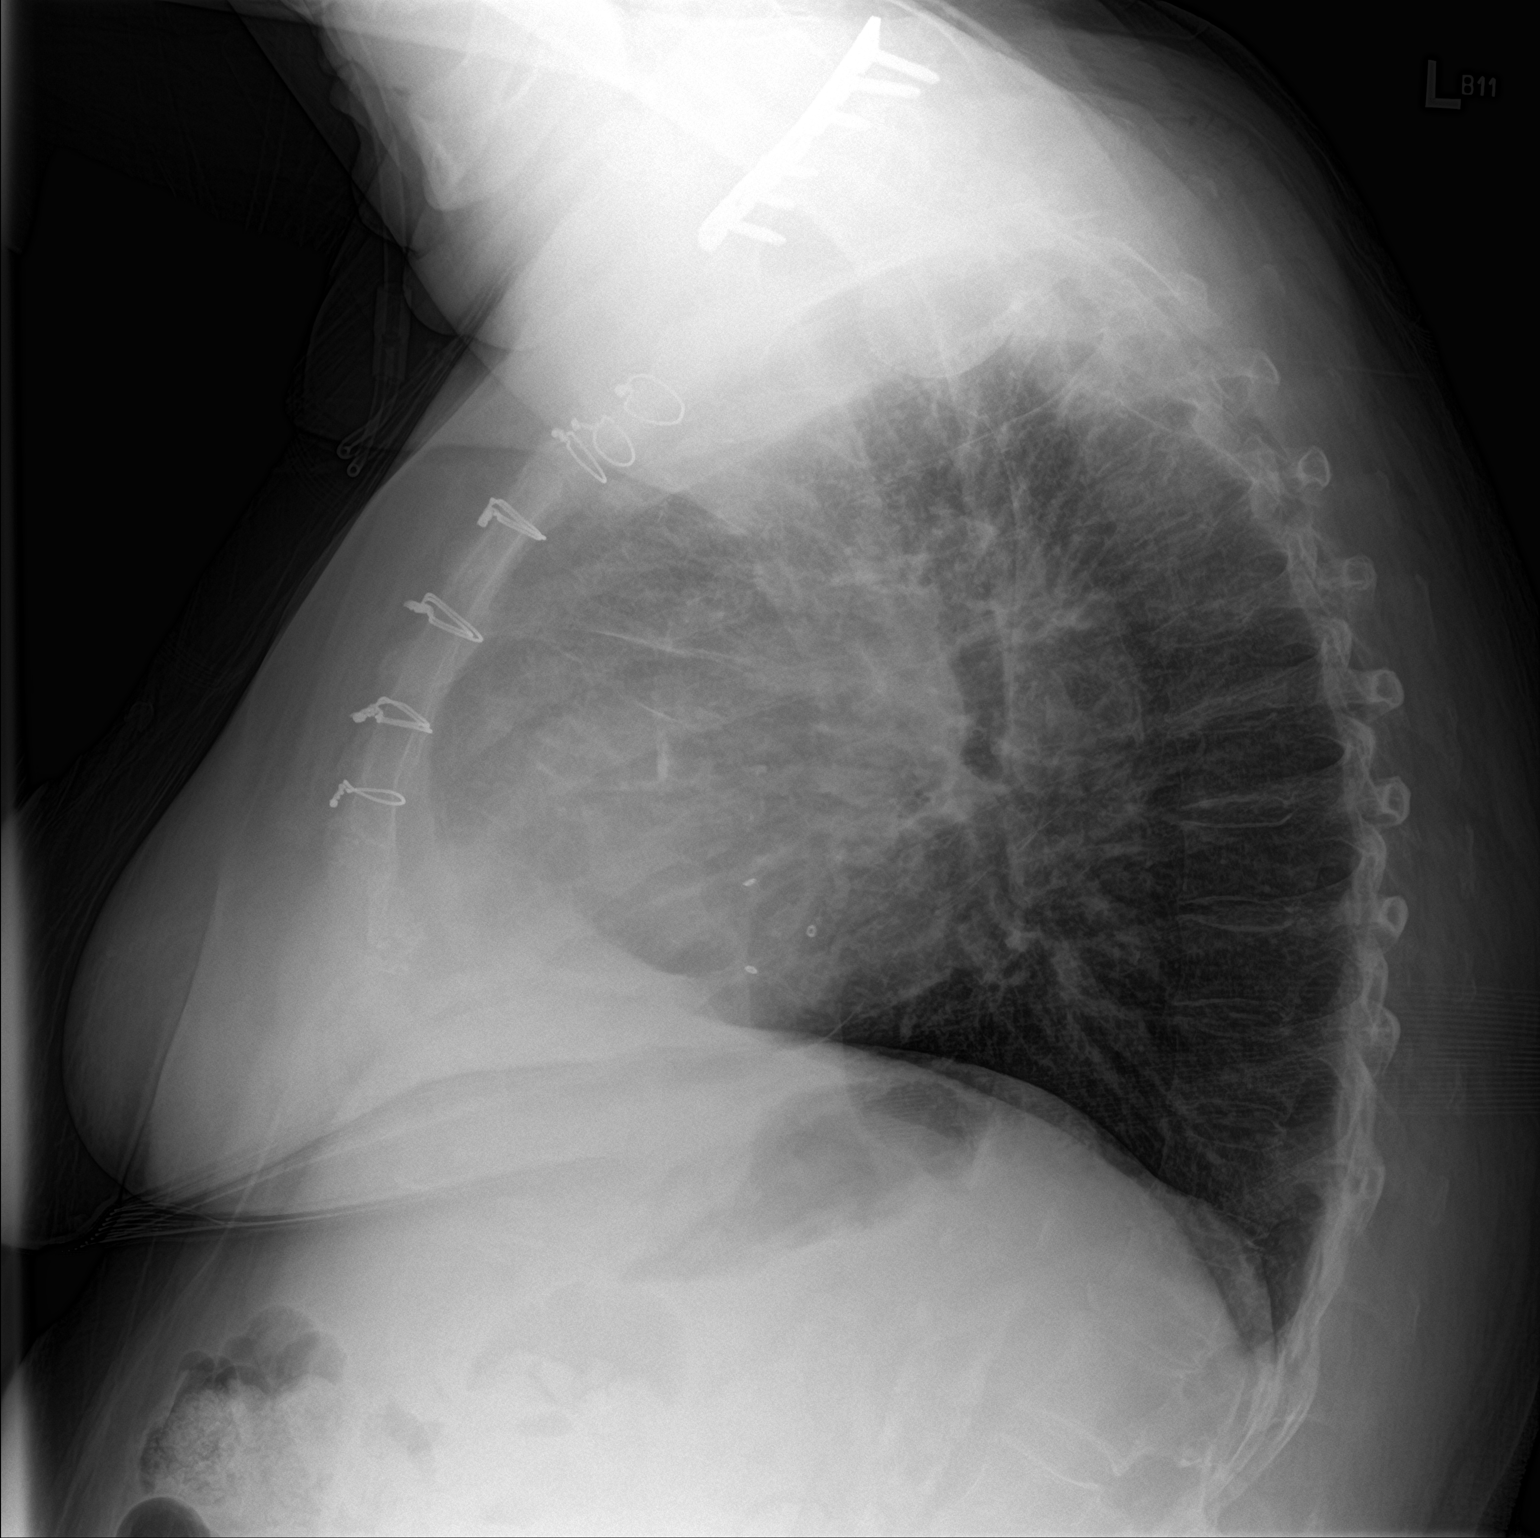

[2 of 2 positions shown; findings below may reference images not displayed]

FINDINGS: Stable postsurgical changes in the thorax.

Cardiomediastinal silhouette is normal. Mediastinal contours appear
intact. Calcific atherosclerotic disease and tortuosity of the
aorta.

There is no evidence of lobar airspace consolidation, pleural
effusion or pneumothorax. Minimal pulmonary vascular congestion.

Osseous structures are without acute abnormality. Soft tissues are
grossly normal.
IMPRESSION: Minimal pulmonary vascular congestion.

Calcific atherosclerotic disease and tortuosity of the aorta.

## 2020-03-21 MED ORDER — HYDROCODONE-ACETAMINOPHEN 5-325 MG PO TABS: 1.0000 | ORAL_TABLET | Freq: Four times a day (QID) | ORAL | 0 refills | Status: DC | PRN

## 2020-03-21 NOTE — Telephone Encounter (Signed)
Called and spoke with pt and she is aware of CY recs.  Nothing further is needed.  

## 2020-03-26 ENCOUNTER — Telehealth: Payer: Self-pay | Admitting: Pharmacist

## 2020-03-26 NOTE — Telephone Encounter (Signed)
Patient texted me results of her PST FS POC INR determination from home. INR = 2.20 on 13.75 mg warfarin/wk. CONTINUE same regimen as 2.22m every Sunday; 073mon Mondays (omit dose); 1/2 x 2.59m41m1.259m33mvery Tuesday and 59mg 29mtablet) Wed/Th/Fri/Sat. Repeat PST FS POC INR on 21-FEB-22.

## 2020-03-28 ENCOUNTER — Ambulatory Visit: Payer: HMO | Admitting: Behavioral Health

## 2020-03-28 ENCOUNTER — Other Ambulatory Visit: Payer: Self-pay

## 2020-03-28 DIAGNOSIS — F419 Anxiety disorder, unspecified: Secondary | ICD-10-CM

## 2020-03-28 DIAGNOSIS — F321 Major depressive disorder, single episode, moderate: Secondary | ICD-10-CM

## 2020-03-28 NOTE — BH Specialist Note (Signed)
Integrated Behavioral Health via Telemedicine Visit  03/28/2020 [name] [id number]  Number of Integrated Behavioral Health visits: 3/6 Session Start time: 11:00am  Session End time: 11:30am Total time: 30  Referring Provider: Dr. Dorian Pod, MD Patient/Family location: Pt at home in private [facility] Provider location: [facility] Office All persons participating in visit: Pt & Clinician  Types of Service: Individual psychotherapy  I connected with [name] and/or [name]'s self by Telephone  (Video is Caregility application) and verified that I am speaking with the correct person using two identifiers.Discussed confidentiality: Yes   I discussed the limitations of telemedicine and the availability of in person appointments.  Discussed there is a possibility of technology failure and discussed alternative modes of communication if that failure occurs.  I discussed that engaging in this telemedicine visit, they consent to the provision of behavioral healthcare and the services will be billed under their insurance.  Patient and/or legal guardian expressed understanding and consented to Telemedicine visit: Yes   Presenting Concerns: Patient and/or family reports the following symptoms/concerns: reduced Sx of anxi/dep Duration of problem: months-years; Severity of problem: mild  Patient and/or Family's Strengths/Protective Factors: Social and Emotional competence, Concrete supports in place (healthy food, safe environments, etc.) and Caregiver has knowledge of parenting & child development  Goals Addressed: Patient will: 1.  Reduce symptoms of: anxiety and depression  2.  Increase knowledge and/or ability of: coping skills and healthy habits  3.  Demonstrate ability to: Increase healthy adjustment to current life circumstances  Progress towards Goals: Ongoing  Interventions: Interventions utilized:  Solution-Focused Strategies and Supportive  Counseling Standardized Assessments completed: Not Needed  Patient and/or Family Response: Pt receptive to call today.  Assessment: Patient currently experiencing reduction in Sx & expressing family dynamic issues.   Patient may benefit from cont'd psychoedu about families.  Plan: 1. Follow up with behavioral health clinician on : 2 wks out on telehealth for 30 min check-in 2. Behavioral recommendations: Cont to be respectful of living differences btwn family members 3. Referral(s): [place] (In Clinic)  I discussed the assessment and treatment plan with the patient and/or parent/guardian. They were provided an opportunity to ask questions and all were answered. They agreed with the plan and demonstrated an understanding of the instructions.   They were advised to call back or seek an in-person evaluation if the symptoms worsen or if the condition fails to improve as anticipated.  Donnetta Hutching, LMFT

## 2020-03-29 ENCOUNTER — Other Ambulatory Visit (HOSPITAL_COMMUNITY): Payer: Self-pay | Admitting: Cardiology

## 2020-03-29 ENCOUNTER — Telehealth: Payer: Self-pay | Admitting: Dietician

## 2020-03-29 DIAGNOSIS — I5032 Chronic diastolic (congestive) heart failure: Secondary | ICD-10-CM

## 2020-03-29 NOTE — Telephone Encounter (Signed)
"  How high is too high?" [name] asks about her blood sugars. She says she is not counting her carbs like she was but she is trying to be careful and watch what she is eating and drinking.  She states she has had two blood sugars in the 300s today and one yesterday of 72 and two of 77 on Tuesday.  She has no symptoms for either the lower or the higher values.  I educated her about compression lows that come from pressure on the sensor and  removing the pressure makes the reading higher. I also assured her that as long as she is drinking plenty of water and her blood sugar is not staying in the 300s that they should not cause problems. She agreed to upload her blood sugars and I will call her in the next day or two.

## 2020-03-30 NOTE — Telephone Encounter (Signed)
Called [name] and told her I would discuss her blood sugars (CGM download) with Dr. [name] and call her next week. She confirmed that she is taking  40 units Xultophy, thinks her lows were from compression and states today's blood sugars are 350/219/219

## 2020-03-30 NOTE — Telephone Encounter (Signed)
Thank you, we'll discuss!

## 2020-03-31 DIAGNOSIS — I509 Heart failure, unspecified: Secondary | ICD-10-CM | POA: Diagnosis not present

## 2020-03-31 DIAGNOSIS — J449 Chronic obstructive pulmonary disease, unspecified: Secondary | ICD-10-CM | POA: Diagnosis not present

## 2020-03-31 DIAGNOSIS — I050 Rheumatic mitral stenosis: Secondary | ICD-10-CM | POA: Diagnosis not present

## 2020-04-02 ENCOUNTER — Other Ambulatory Visit: Payer: Self-pay | Admitting: Internal Medicine

## 2020-04-02 ENCOUNTER — Telehealth: Payer: Self-pay | Admitting: Pharmacist

## 2020-04-02 DIAGNOSIS — E1140 Type 2 diabetes mellitus with diabetic neuropathy, unspecified: Secondary | ICD-10-CM

## 2020-04-02 DIAGNOSIS — Z794 Long term (current) use of insulin: Secondary | ICD-10-CM

## 2020-04-02 NOTE — Telephone Encounter (Signed)
Patient texted results of PST FS POC INR performed today, 2.3 (goal 1.5 - 2.5) on 13.51m warfarin/wk (uses 2.526mtablets: 1x2.58m61mn Sun/Wed/Th/Fr/Sa; 1/2x 2.58mg60m.258mg49m Tu; NO warfarin on Mondays. Continue same regimen. Repeat in one week.

## 2020-04-02 NOTE — Telephone Encounter (Signed)
I agree

## 2020-04-02 NOTE — Telephone Encounter (Addendum)
Discussed Ms. Aguas's Continuous glucose monitoring upload with Dr. [name]. She recommended a 3 unit increase in Ms. Xia's Xultophy to 43 units daily. Patient notified of change.

## 2020-04-09 ENCOUNTER — Telehealth: Payer: Self-pay | Admitting: Pharmacist

## 2020-04-09 DIAGNOSIS — Z7901 Long term (current) use of anticoagulants: Secondary | ICD-10-CM | POA: Diagnosis not present

## 2020-04-09 DIAGNOSIS — I480 Paroxysmal atrial fibrillation: Secondary | ICD-10-CM | POA: Diagnosis not present

## 2020-04-09 NOTE — Telephone Encounter (Signed)
Patient texted me results of PST FS POC INR determination performed this morning, 2.6 (target goal 1.5 -2.5 as per previous PCPs). She is on 13.8m warfarin/wk. Will reduce to 12.564mwarfarin/wk. As one (1) of your 2.6m72mreen warfarin tablets on Sundays, Thursdays, Fridays and Saturdays. Tuesdays and Wednesdays, take 1/2 of your 2.6mg41meen warfarin tablet (1.26mg41me); and on MONDAYS--take NO WARFARIN on Mondays. Repeat PST FS POC INR next Monday 7MAR22 and text me results. Patient states she has had no signs or symptoms of bleeding or embolic events.

## 2020-04-10 ENCOUNTER — Ambulatory Visit: Payer: HMO | Admitting: Behavioral Health

## 2020-04-10 ENCOUNTER — Other Ambulatory Visit: Payer: Self-pay

## 2020-04-10 ENCOUNTER — Encounter (HOSPITAL_COMMUNITY): Payer: HMO | Admitting: Cardiology

## 2020-04-10 ENCOUNTER — Telehealth: Payer: Self-pay | Admitting: Behavioral Health

## 2020-04-10 ENCOUNTER — Telehealth: Payer: Self-pay

## 2020-04-10 NOTE — Telephone Encounter (Signed)
Contacted Pt at her request @ 11:15am. Spoke w/Pt for approximately 15-20 min regarding concerns. Pt expressed intense anxiety over text sent by older Dtr's Live[employer]in BF. Processed Pt concerns.  Dr. Theodis Shove

## 2020-04-10 NOTE — Telephone Encounter (Signed)
Pt is requesting a call back [phone]

## 2020-04-10 NOTE — Telephone Encounter (Signed)
I will return her call. TY!

## 2020-04-11 ENCOUNTER — Ambulatory Visit: Payer: HMO | Admitting: Behavioral Health

## 2020-04-11 ENCOUNTER — Other Ambulatory Visit: Payer: Self-pay

## 2020-04-11 DIAGNOSIS — F419 Anxiety disorder, unspecified: Secondary | ICD-10-CM

## 2020-04-11 NOTE — Progress Notes (Signed)
[name] is here for close follow-up of multimorbidity (monthly visits intended, though she missed her January/February visit due to illness). I last saw her in 01/2020.  Although in reasonably good spirits today, she reports that she is feeling pretty awful, in part due to some social friction with family members. Physically, chronic pain is what she wants to spend most of our time talking about, and requests toenail trim today.  Pain is diffuse, but the most bothersome and functionally limiting area is her right lower back.  She understands that chronic opioid use can lead to complications, including tolerance and addiction.  She feels that she probably is addicted at this point, but if there is benefit that will lead to improve mobility, this is a trade off she is willing to accept.  She has not experienced side effects related to ongoing opioid use.  She does not misuse her medicines and does not ask for early refills.  She finds herself needing to take 2 rather than 1 tablet frequently.  On good days, she will take her medicine only twice a day and on other days she will need it 3-4 times a day.  COPD-now on Trelegy prescribed by Dr. Keturah Barre, which is helping her breathe more easily. She continues to produce significant amount of sputum which is not new and which has not changed color. She mentions that a CXR obtained by Dr. Annamaria Boots showed "a mass" which she understands will need either a follow-up CXR or a CT scan. She has been in communication with Dr. [name] office and has the understanding something will be scheduled soon. This finding is concerning to her.  Heart failure-no change in dyspnea, no new lower extremity edema. Visit with heart failure specialist Dr. Aundra Dubin is next week. She continues to take her medications as prescribed, reviewed today one by one.  Epigastric pain (and hx of Fe deficiency anemia)  is to be evaluated soon in the inpatient setting with EGD along with  colonoscopy-admission required for cardiopulmonary monitoring given her comorbidities. She continues to take PPI.      Current Outpatient Medications:  .  ALPRAZolam (XANAX) 1 MG tablet, TAKE 1 TABLET BY MOUTH EVERY NIGHT AT BEDTIME AS NEEDED FOR INSOMNIA. MAY TAKE 1/2 TABLET BY MOUTH DURING THE DAY FOR ANXIETY, Disp: 55 tablet, Rfl: 4 .  benzonatate (TESSALON) 100 MG capsule, Take 1 capsule (100 mg total) by mouth 3 (three) times daily as needed for cough., Disp: 90 capsule, Rfl: 11 .  buPROPion (WELLBUTRIN XL) 150 MG 24 hr tablet, Take 1 tablet (150 mg total) by mouth every morning for 365 doses., Disp: 90 tablet, Rfl: 3 .  DULoxetine (CYMBALTA) 60 MG capsule, Take 1 capsule (60 mg total) by mouth 2 (two) times daily., Disp: 60 capsule, Rfl: 3 .  furosemide (LASIX) 80 MG tablet, TAKE 1 TABLET BY MOUTH EVERY MORNING AND TAKE 1/2 TABLET EVERY EVENING.  Needs appointment for further refill., Disp: 60 tablet, Rfl: 0 .  gabapentin (NEURONTIN) 300 MG capsule, Take 2 capsules (600 mg total) by mouth 3 (three) times daily., Disp: 540 capsule, Rfl: 3 .  [START ON 04/19/2020] HYDROcodone-acetaminophen (NORCO) 7.5-325 MG tablet, Take 1 tablet by mouth every 6 (six) hours as needed for severe pain., Disp: 120 tablet, Rfl: 0 .  ipratropium-albuterol (DUONEB) 0.5-2.5 (3) MG/3ML SOLN, 3 ml as needed, Disp: , Rfl:  .  metoprolol succinate (TOPROL-XL) 50 MG 24 hr tablet, Take 1 tablet (50 mg total) by mouth daily. Take with  or immediately following a meal., Disp: 90 tablet, Rfl: 3 .  omeprazole (PRILOSEC) 40 MG capsule, Take 1 capsule (40 mg total) by mouth in the morning and at bedtime., Disp: 180 capsule, Rfl: 1 .  potassium chloride SA (KLOR-CON) 20 MEQ tablet, Take 2 tabs by mouth every morning and 1 tab every evening to replace potassium lost from lasix use., Disp: 270 tablet, Rfl: 3 .  rosuvastatin (CRESTOR) 20 MG tablet, TAKE 1 TABLET BY MOUTH ONCE EVERY NIGHT AT BEDTIME, Disp: 90 tablet, Rfl: 3 .   albuterol (PROAIR HFA) 108 (90 Base) MCG/ACT inhaler, Inhale 2 puffs into the lungs every 6 (six) hours as needed for shortness of breath., Disp: 18 g, Rfl: 5 .  Blood Glucose Monitoring Suppl (ONETOUCH VERIO) W/DEVICE KIT, 1 each by Does not apply route 4 (four) times daily., Disp: 1 kit, Rfl: 0 .  chlorpheniramine (CHLOR-TRIMETON) 4 MG tablet, Take 4 mg by mouth daily as needed for allergies., Disp: , Rfl:  .  Continuous Blood Gluc Sensor (FREESTYLE LIBRE 2 SENSOR) MISC, 1 each by Does not apply route 6 (six) times daily. To read 6 times daily with change of sensor every 2 weeks, 90 days worth with 3 refills, Disp: 7 each, Rfl: 3 .  fluticasone (FLONASE) 50 MCG/ACT nasal spray, Place 2 sprays into both nostrils daily., Disp: 54 g, Rfl: 3 .  Fluticasone-Umeclidin-Vilant (TRELEGY ELLIPTA) 100-62.5-25 MCG/INH AEPB, Inhale 1 puff into the lungs daily., Disp: 60 each, Rfl: 6 .  glucose blood (ONETOUCH VERIO) test strip, Use to check blood sugar up to 7 times weekly. diag code E11.40. Insulin dependent, Disp: 25 each, Rfl: 8 .  hydrocortisone cream 1 %, APPLY EXTERNALLY TO THE AFFECTED AREA TWICE DAILY, Disp: 28.35 g, Rfl: 2 .  Insulin Degludec-Liraglutide (XULTOPHY) 100-3.6 UNIT-MG/ML SOPN, Inject 45 Units into the skin daily for 30 doses., Disp: 3 mL, Rfl: 3 .  Insulin Pen Needle 32G X 4 MM MISC, Use to inject insulin up to 6 times a day, Disp: 500 each, Rfl: 3 .  Insulin Syringe-Needle U-100 31G X 15/64" 0.5 ML MISC, Use to inject insulin up to 4 times a day, Disp: 400 each, Rfl: 3 .  ipratropium-albuterol (DUONEB) 0.5-2.5 (3) MG/3ML SOLN, INHALE THE CONTENTS OF 1 VIAL VIA NEBULIZER EVERY 6 HOURS AS NEEDED FOR SHORTNESS OF BREATH, Disp: 360 mL, Rfl: 3 .  Lancets Misc. (ACCU-CHEK FASTCLIX LANCET) KIT, Check your blood 4 times a day dx code 250.00 insulin requiring, Disp: 1 kit, Rfl: 2 .  lidocaine (LIDODERM) 5 %, Place 1 patch onto the skin every 12 (twelve) hours. Remove & Discard patch within 12 hours  or as directed by MD, Disp: 60 patch, Rfl: 2 .  nystatin (NYSTATIN) powder, Apply 1 application topically 3 (three) times daily as needed., Disp: 60 g, Rfl: 4 .  OXYGEN, Inhale 2 L into the lungs at bedtime., Disp: , Rfl:  .  PRESCRIPTION MEDICATION, Inhale into the lungs at bedtime. CPAP, Disp: , Rfl:  .  terbinafine (LAMISIL) 1 % cream, Apply cream to clean and dry feet twice a day until gone.  Apply to toes, and to sides, heels, and soles of feet., Disp: 30 g, Rfl: 1 .  warfarin (COUMADIN) 2.5 MG tablet, Take 2.60m warfarin on We/Th/Sa/Su; 036mon Mondays; 1.2583m1/2 x 2.5mg33mblet) on Tu/Fri., Disp: 20 tablet, Rfl: 1   Objective:  BP (!) 115/47 (BP Location: Right Arm, Patient Position: Sitting, Cuff Size: Small)   Pulse 64  Temp 98.4 F (36.9 C) (Oral)   Ht _0  (1.575 m)   Wt 170 lb 11.2 oz (77.4 kg)   SpO2 94%   BMI 31.22 kg/m   Fine tremor of hands noted. No tearfulness or anxiety today, mood is positive.  Feet/legs: Toes cyanotic and cool in dependent position (chronic).  Long toenails were reduced.  Onycholysis developing beneath many nails, no acute inflammation.  Fine scale in moccasin distribution remains, for which OTC terbinafine was prescribed, though she hasn't used it consistently (expensive, difficult to reach her feet). Pedal pulses absent (chronic).  No intertrigo today.  Assessment and Plan:  Ms. Christy is holding her own.  Multimorbidity contributes to generally poor quality of life; though she seems to have been in better spirits lately, perhaps due to establishing a relationship with therapist Dr. Theodis Shove in our office.  2 primary issues of concern today-uncontrolled T2DM, and uncontrolled chronic pain which we had planned to spend the majority of our visit today discussing.  Please see problem based documentation for details.   Greater than 50% of this 45-minute visit was spent in education and counseling for the problems noted today.  Follow-up 1  month.

## 2020-04-11 NOTE — BH Specialist Note (Signed)
Integrated Behavioral Health via Telemedicine Visit  04/11/2020 [name] [id number]  Number of [place] visits: 4/6 Session Start time: 11:00am  Session End time: 11:30am Total time: 30  Referring Provider: Dr. Dorian Pod, MD Patient/Family location: Pt at home in private [facility] Provider location: [facility] Office All persons participating in visit: Pt & Clinician Types of Service: Individual psychotherapy  I connected with [name] and/or [name]'s self by Telephone  (Video is Caregility application) and verified that I am speaking with the correct person using two identifiers.Discussed confidentiality: Yes   I discussed the limitations of telemedicine and the availability of in person appointments.  Discussed there is a possibility of technology failure and discussed alternative modes of communication if that failure occurs.  I discussed that engaging in this telemedicine visit, they consent to the provision of behavioral healthcare and the services will be billed under their insurance.  Patient and/or legal guardian expressed understanding and consented to Telemedicine visit: Yes   Presenting Concerns: Patient and/or family reports the following symptoms/concerns: elevated anxiety due to recent text from older Dtr's BF w/very negative content Duration of problem: years; Severity of problem: mild  Patient and/or Family's Strengths/Protective Factors: Social connections and [place] supports in place (healthy food, safe environments, etc.)  Goals Addressed: Patient will: 1.  Reduce symptoms of: anxiety, depression and stress  2.  Increase knowledge and/or ability of: coping skills and stress reduction  3.  Demonstrate ability to: Increase healthy adjustment to current life circumstances and separate self enough to have stronger boundaries w/family  Progress towards Goals: Ongoing  Interventions: Interventions  utilized:  Supportive Counseling and Psychoeducation and/or Health Education Standardized Assessments completed: Not Needed  Patient and/or Family Response: Pt receptive to call today, c/o multiple reminders from [facility] for appt today (4 per Pt report).  Assessment: Patient currently experiencing elevated anx/dep Sx since Dtr's BF text her w/very neg content.   Patient may benefit from addt'l sessions to address Sx & characterological tendencies.  Plan: 1. Follow up with behavioral health clinician on : 2-3 wks on teleheatlh for 30 min 2. Behavioral recommendations: Strengthen personal boundaries & give Family members clear expectations 3. Referral(s): [place] (In Clinic)  I discussed the assessment and treatment plan with the patient and/or parent/guardian. They were provided an opportunity to ask questions and all were answered. They agreed with the plan and demonstrated an understanding of the instructions.   They were advised to call back or seek an in-person evaluation if the symptoms worsen or if the condition fails to improve as anticipated.  Donnetta Hutching, LMFT

## 2020-04-12 ENCOUNTER — Ambulatory Visit (INDEPENDENT_AMBULATORY_CARE_PROVIDER_SITE_OTHER): Payer: HMO | Admitting: Internal Medicine

## 2020-04-12 ENCOUNTER — Encounter: Payer: Self-pay | Admitting: Internal Medicine

## 2020-04-12 VITALS — BP 115/47 | HR 64 | Temp 98.4°F | Ht 62.0 in | Wt 170.7 lb

## 2020-04-12 DIAGNOSIS — F322 Major depressive disorder, single episode, severe without psychotic features: Secondary | ICD-10-CM | POA: Diagnosis not present

## 2020-04-12 DIAGNOSIS — J439 Emphysema, unspecified: Secondary | ICD-10-CM

## 2020-04-12 DIAGNOSIS — I700 Atherosclerosis of aorta: Secondary | ICD-10-CM

## 2020-04-12 DIAGNOSIS — R519 Headache, unspecified: Secondary | ICD-10-CM

## 2020-04-12 DIAGNOSIS — I5032 Chronic diastolic (congestive) heart failure: Secondary | ICD-10-CM

## 2020-04-12 DIAGNOSIS — E1140 Type 2 diabetes mellitus with diabetic neuropathy, unspecified: Secondary | ICD-10-CM

## 2020-04-12 DIAGNOSIS — B372 Candidiasis of skin and nail: Secondary | ICD-10-CM | POA: Diagnosis not present

## 2020-04-12 DIAGNOSIS — K219 Gastro-esophageal reflux disease without esophagitis: Secondary | ICD-10-CM

## 2020-04-12 DIAGNOSIS — I480 Paroxysmal atrial fibrillation: Secondary | ICD-10-CM | POA: Diagnosis not present

## 2020-04-12 DIAGNOSIS — I872 Venous insufficiency (chronic) (peripheral): Secondary | ICD-10-CM

## 2020-04-12 DIAGNOSIS — I130 Hypertensive heart and chronic kidney disease with heart failure and stage 1 through stage 4 chronic kidney disease, or unspecified chronic kidney disease: Secondary | ICD-10-CM

## 2020-04-12 DIAGNOSIS — I739 Peripheral vascular disease, unspecified: Secondary | ICD-10-CM | POA: Diagnosis not present

## 2020-04-12 DIAGNOSIS — G479 Sleep disorder, unspecified: Secondary | ICD-10-CM

## 2020-04-12 DIAGNOSIS — Z720 Tobacco use: Secondary | ICD-10-CM

## 2020-04-12 DIAGNOSIS — Z794 Long term (current) use of insulin: Secondary | ICD-10-CM

## 2020-04-12 DIAGNOSIS — R918 Other nonspecific abnormal finding of lung field: Secondary | ICD-10-CM

## 2020-04-12 DIAGNOSIS — N183 Chronic kidney disease, stage 3 unspecified: Secondary | ICD-10-CM

## 2020-04-12 DIAGNOSIS — M545 Low back pain, unspecified: Secondary | ICD-10-CM | POA: Diagnosis not present

## 2020-04-12 DIAGNOSIS — J9611 Chronic respiratory failure with hypoxia: Secondary | ICD-10-CM

## 2020-04-12 DIAGNOSIS — I10 Essential (primary) hypertension: Secondary | ICD-10-CM

## 2020-04-12 DIAGNOSIS — G8929 Other chronic pain: Secondary | ICD-10-CM

## 2020-04-12 DIAGNOSIS — Z860100 Personal history of colon polyps, unspecified: Secondary | ICD-10-CM

## 2020-04-12 DIAGNOSIS — E7841 Elevated Lipoprotein(a): Secondary | ICD-10-CM

## 2020-04-12 DIAGNOSIS — F321 Major depressive disorder, single episode, moderate: Secondary | ICD-10-CM

## 2020-04-12 DIAGNOSIS — J449 Chronic obstructive pulmonary disease, unspecified: Secondary | ICD-10-CM

## 2020-04-12 LAB — GLUCOSE, CAPILLARY: Glucose-Capillary: 254 mg/dL — ABNORMAL HIGH (ref 70–99)

## 2020-04-12 LAB — POCT GLYCOSYLATED HEMOGLOBIN (HGB A1C): Hemoglobin A1C: 8.1 % — AB (ref 4.0–5.6)

## 2020-04-12 MED ORDER — BUPROPION HCL ER (XL) 150 MG PO TB24: 150.0000 mg | ORAL_TABLET | ORAL | 3 refills | Status: DC

## 2020-04-12 MED ORDER — HYDROCODONE-ACETAMINOPHEN 7.5-325 MG PO TABS: 1.0000 | ORAL_TABLET | Freq: Four times a day (QID) | ORAL | 0 refills | Status: DC | PRN

## 2020-04-12 MED ORDER — KETOROLAC TROMETHAMINE 30 MG/ML IJ SOLN
60.0000 mg | Freq: Once | INTRAMUSCULAR | Status: AC
  Administered 2020-04-12: 60 mg via INTRAMUSCULAR

## 2020-04-12 MED ORDER — NYSTATIN 100000 UNIT/GM EX POWD: 1.0000 "application " | Freq: Three times a day (TID) | CUTANEOUS | 4 refills | Status: DC | PRN

## 2020-04-12 MED ORDER — XULTOPHY 100-3.6 UNIT-MG/ML ~~LOC~~ SOPN: 45.0000 [IU] | PEN_INJECTOR | Freq: Every day | SUBCUTANEOUS | 3 refills | Status: DC

## 2020-04-12 NOTE — Assessment & Plan Note (Signed)
No change in status of feet or symptoms at this time.  From my last visit, "Formal ABIs resulted earlier this month.  Right ABI notable for moderate right lower extremity arterial disease based on posterior tibial artery pressure.  Left ABI within normal range.  Cardiologist Dr. Benjamine Mola had advise [name] to be evaluated by interventional vascular cardiologist Dr. Gwenlyn Found, though she wishes to postpone for now.  The pain that she described to me today was not claudication but rather neuropathy.  We will continue to observe.  Note she is not on antiplatelet therapy but does take Coumadin and is a high fall risk.  Hypertension, DM2, and hyperlipidemia management continue.  She is not motivated to stop smoking which is her primary risk factor at this time."

## 2020-04-12 NOTE — Assessment & Plan Note (Signed)
>>  ASSESSMENT AND PLAN FOR CHRONIC CHF (CONGESTIVE HEART FAILURE) (HCC) WRITTEN ON 04/12/2020  2:52 PM BY Miguel Aschoff, MD  Chronic dyspnea is multifactorial.  She has no lower extremity edema.  Continues to take her medications as prescribed, including diuretic.  Follow-up with heart failure clinic scheduled for next week.

## 2020-04-12 NOTE — Assessment & Plan Note (Addendum)
Pain is chronic and unremitting, relieved in all areas of her body (headache, fibromyalgia tenderness points) except for the right low back.  She does apply the Lidoderm patches which do not provide much benefit.  We had a long discussion about long-term use of opioids.  She questions whether other agents may be more beneficial.  We also discussed doses.  She does not ask for early refills.  We agreed to try oxycodone 7.5 mg rather than 5 mg to be taken 1 every 3-4 times a day with the goal of reducing her need to double up on the 5 mg for uncontrolled discomfort.  We will try this for a month.  While not ideal, I also understand that she is not likely to be able to minimize her opioid dependence at this time in her life given her ongoing pain issues.  Reduced pain can assist with sleep and general wellbeing if done safely.  I am committed to helping her with this.  Once we have agreed on a reasonably effective regimen, we will complete an opioid contract.

## 2020-04-12 NOTE — Patient Instructions (Signed)
[name] to see you!  I missed you in January/February.  We focused today on pain management, and agreed to increase your dose of hydrocodone from 5 mg to 7.5.  YOu will take this 1 every 6 hrs or so, less if needed.  You will resume your Flonase nasal spray for your runny nose.  Bloodwork today to look at kidneys and cholesterol.  You have f/u with Dr. Benjamine Mola soon, and will be getting your EGD and colonoscopy done this Spring as well.  Please try to apply the fungus cream to your feet and toes to get rid of the scaly rash.  Toenails trimmed today.  [name] hoo!  Let's increase your Xultophy to 45 units daily.  A1 c was up today to 8.1.  We'll get there.  Don't fret.  See you in a month.  Call if needed.  Dr. [name]

## 2020-04-12 NOTE — Assessment & Plan Note (Signed)
She has not interested in smoking cessation at this time.  Please see prior documentation.

## 2020-04-12 NOTE — Assessment & Plan Note (Signed)
Currently well controlled, nystatin powder used as needed.  Reordered, with refills.

## 2020-04-12 NOTE — Assessment & Plan Note (Signed)
Trial of Trelegy inhaler as initiated by Dr. Annamaria Boots has been very successful, she is experiencing much improvement in dyspnea.  Dr. Annamaria Boots will also be working with her on smoking cessation, though she has been adamant with me in the past that she does not wish to quit; "it is the only thing I have, I do not want to give that up too".

## 2020-04-12 NOTE — Assessment & Plan Note (Signed)
No antiplatelet therapy while on anticoagulation.  BP well controlled.  On statin therapy.  DMT2 uncontrolled, adjustments being made.  This in combination with her severe PAD is concerning for potential limb loss.  No intervention needed at this time.

## 2020-04-12 NOTE — Assessment & Plan Note (Signed)
Continue statin with annual recheck of lipid panel.

## 2020-04-12 NOTE — Assessment & Plan Note (Signed)
Colonoscopy as well as EGD will be scheduled and patient in 05/2020.

## 2020-04-12 NOTE — Assessment & Plan Note (Signed)
O2 is used at night.

## 2020-04-12 NOTE — Assessment & Plan Note (Signed)
Uncontrolled, A1c has increased.  Increase Xultophy to 45 units daily.  There have been no episodes of hypoglycemia.  45% of her readings are within target range, the remaining being above.

## 2020-04-12 NOTE — Assessment & Plan Note (Signed)
Recheck BMP today for ongoing monitoring.  Well-controlled.

## 2020-04-12 NOTE — Assessment & Plan Note (Signed)
Not addressed today, when I speak to her by phone to provide today's lab results, I will inquire whether she began her high-dose supplementation.  High risk of falls with fracture.

## 2020-04-12 NOTE — Assessment & Plan Note (Signed)
No edema at this time.  Feet are chronically cyanotic.  Monitor.

## 2020-04-12 NOTE — Assessment & Plan Note (Signed)
No change in symptoms from last documentation.  If bupropion in the nighttime is contributing to insomnia, this may benefit from once daily dosing in the morning.  Medication changed to 150 mg every morning.  She continues to take Xanax 1 mg every night which was prescribed sometime ago for insomnia.  She never sleeps for long period of time but rather "naps around the clock".  This is a multifactorial issue.

## 2020-04-12 NOTE — Assessment & Plan Note (Signed)
[place] sessions have been established with Dr. Flavia Shipper in our clinic which has been positive.  She continues to be challenged by family and social stressors and by her chronic illnesses and debility.  For the last 2 visits though she has not been tearful.  Continue Cymbalta as prescribed.  She is currently taking bupropion twice daily, which in the evening could be contributing to her insomnia.  We will combine to once daily 150 mg which she will take in the morning.  PHQ-9 is not completed as I am not monitoring her numbers but rather her symptoms.

## 2020-04-12 NOTE — Assessment & Plan Note (Signed)
Well-controlled on current regimen.  Check BMP today.  Follow-up at CHF clinic next week.

## 2020-04-12 NOTE — Assessment & Plan Note (Signed)
To be very symptomatic with epigastric discomfort despite PPI twice daily.  EGD is planned in the hospital in 05/2020, together with colonoscopy.

## 2020-04-12 NOTE — Assessment & Plan Note (Signed)
Chronic dyspnea is multifactorial.  She has no lower extremity edema.  Continues to take her medications as prescribed, including diuretic.  Follow-up with heart failure clinic scheduled for next week.

## 2020-04-13 LAB — LIPID PANEL
Chol/HDL Ratio: 3.3 ratio (ref 0.0–4.4)
Cholesterol, Total: 115 mg/dL (ref 100–199)
HDL: 35 mg/dL — ABNORMAL LOW (ref >39)
LDL Chol Calc (NIH): 51 mg/dL (ref 0–99)
Triglycerides: 171 mg/dL — ABNORMAL HIGH (ref 0–149)
VLDL Cholesterol Cal: 29 mg/dL (ref 5–40)

## 2020-04-13 LAB — BMP8+ANION GAP
Anion Gap: 14 mmol/L (ref 10.0–18.0)
BUN/Creatinine Ratio: 17 (ref 12–28)
BUN: 17 mg/dL (ref 8–27)
CO2: 26 mmol/L (ref 20–29)
Calcium: 9.4 mg/dL (ref 8.7–10.3)
Chloride: 99 mmol/L (ref 96–106)
Creatinine, Ser: 0.99 mg/dL (ref 0.57–1.00)
Glucose: 212 mg/dL — ABNORMAL HIGH (ref 65–99)
Potassium: 4.3 mmol/L (ref 3.5–5.2)
Sodium: 139 mmol/L (ref 134–144)
eGFR: 61 mL/min/{1.73_m2} (ref >59)

## 2020-04-13 LAB — CBC
Hematocrit: 32.5 % — ABNORMAL LOW (ref 34.0–46.6)
Hemoglobin: 10 g/dL — ABNORMAL LOW (ref 11.1–15.9)
MCH: 26.2 pg — ABNORMAL LOW (ref 26.6–33.0)
MCHC: 30.8 g/dL — ABNORMAL LOW (ref 31.5–35.7)
MCV: 85 fL (ref 79–97)
Platelets: 224 10*3/uL (ref 150–450)
RBC: 3.82 x10E6/uL (ref 3.77–5.28)
RDW: 15 % (ref 11.7–15.4)
WBC: 7 10*3/uL (ref 3.4–10.8)

## 2020-04-16 ENCOUNTER — Telehealth: Payer: Self-pay | Admitting: *Deleted

## 2020-04-16 NOTE — Telephone Encounter (Signed)
Received fax from [facility] with INR results from 12/05/2019 to 04/16/2020. Last INR 2.0 on 04/16/2020. Results placed in Dr. Gladstone Pih office.

## 2020-04-17 ENCOUNTER — Other Ambulatory Visit: Payer: Self-pay

## 2020-04-17 ENCOUNTER — Telehealth: Payer: Self-pay

## 2020-04-17 ENCOUNTER — Encounter (HOSPITAL_COMMUNITY): Payer: Self-pay | Admitting: Cardiology

## 2020-04-17 ENCOUNTER — Other Ambulatory Visit: Payer: Self-pay | Admitting: Internal Medicine

## 2020-04-17 ENCOUNTER — Ambulatory Visit (HOSPITAL_COMMUNITY)
Admission: RE | Admit: 2020-04-17 | Discharge: 2020-04-17 | Disposition: A | Discharge status: Home / Self Care | Payer: HMO | Source: Ambulatory Visit | Attending: Cardiology | Admitting: Cardiology

## 2020-04-17 VITALS — BP 104/60 | HR 62 | Wt 169.9 lb

## 2020-04-17 DIAGNOSIS — I480 Paroxysmal atrial fibrillation: Secondary | ICD-10-CM | POA: Diagnosis not present

## 2020-04-17 DIAGNOSIS — I6529 Occlusion and stenosis of unspecified carotid artery: Secondary | ICD-10-CM | POA: Insufficient documentation

## 2020-04-17 DIAGNOSIS — Z953 Presence of xenogenic heart valve: Secondary | ICD-10-CM | POA: Insufficient documentation

## 2020-04-17 DIAGNOSIS — R079 Chest pain, unspecified: Secondary | ICD-10-CM | POA: Insufficient documentation

## 2020-04-17 DIAGNOSIS — J439 Emphysema, unspecified: Secondary | ICD-10-CM

## 2020-04-17 DIAGNOSIS — F17210 Nicotine dependence, cigarettes, uncomplicated: Secondary | ICD-10-CM | POA: Insufficient documentation

## 2020-04-17 DIAGNOSIS — Z8249 Family history of ischemic heart disease and other diseases of the circulatory system: Secondary | ICD-10-CM | POA: Insufficient documentation

## 2020-04-17 DIAGNOSIS — E1140 Type 2 diabetes mellitus with diabetic neuropathy, unspecified: Secondary | ICD-10-CM | POA: Diagnosis not present

## 2020-04-17 DIAGNOSIS — R0602 Shortness of breath: Secondary | ICD-10-CM | POA: Insufficient documentation

## 2020-04-17 DIAGNOSIS — I2723 Pulmonary hypertension due to lung diseases and hypoxia: Secondary | ICD-10-CM

## 2020-04-17 DIAGNOSIS — J449 Chronic obstructive pulmonary disease, unspecified: Secondary | ICD-10-CM | POA: Insufficient documentation

## 2020-04-17 DIAGNOSIS — Z794 Long term (current) use of insulin: Secondary | ICD-10-CM | POA: Diagnosis not present

## 2020-04-17 DIAGNOSIS — I272 Pulmonary hypertension, unspecified: Secondary | ICD-10-CM | POA: Insufficient documentation

## 2020-04-17 DIAGNOSIS — Z7901 Long term (current) use of anticoagulants: Secondary | ICD-10-CM | POA: Insufficient documentation

## 2020-04-17 DIAGNOSIS — G4733 Obstructive sleep apnea (adult) (pediatric): Secondary | ICD-10-CM | POA: Diagnosis not present

## 2020-04-17 DIAGNOSIS — I2510 Atherosclerotic heart disease of native coronary artery without angina pectoris: Secondary | ICD-10-CM | POA: Diagnosis not present

## 2020-04-17 DIAGNOSIS — I5032 Chronic diastolic (congestive) heart failure: Secondary | ICD-10-CM | POA: Diagnosis not present

## 2020-04-17 DIAGNOSIS — Z79899 Other long term (current) drug therapy: Secondary | ICD-10-CM | POA: Diagnosis not present

## 2020-04-17 MED ORDER — IPRATROPIUM-ALBUTEROL 0.5-2.5 (3) MG/3ML IN SOLN: 3.0000 mL | Freq: Four times a day (QID) | RESPIRATORY_TRACT | 3 refills | Status: DC | PRN

## 2020-04-17 NOTE — Telephone Encounter (Signed)
Return pt's call - states she needs duoneb rx filled and send to [facility]. States she does not use it very often,so send a "small amount". Thanks

## 2020-04-17 NOTE — Telephone Encounter (Signed)
Pls contact pt [phone]

## 2020-04-17 NOTE — Patient Instructions (Addendum)
EKG done today.  No Labs done today.   No medication changes were made. Please continue all current medications as prescribed.  Your physician recommends that you schedule a follow-up appointment in: 4 months. Please contact our office in June to schedule a July appointment.   If you have any questions or concerns before your next appointment please send [place] a message through [place] or call our office at [phone].    TO LEAVE A MESSAGE FOR THE NURSE SELECT OPTION 2, PLEASE LEAVE A MESSAGE INCLUDING: . YOUR NAME . DATE OF BIRTH . CALL BACK NUMBER . REASON FOR CALL**this is important as we prioritize the call backs  YOU WILL RECEIVE A CALL BACK THE SAME DAY AS LONG AS YOU CALL BEFORE 4:00 PM   Do the following things EVERYDAY: 1) Weigh yourself in the morning before breakfast. Write it down and keep it in a log. 2) Take your medicines as prescribed 3) Eat low salt foods--Limit salt (sodium) to 2000 mg per day.  4) Stay as active as you can everyday 5) Limit all fluids for the day to less than 2 liters   At the [facility], you and your health needs are our priority. As part of our continuing mission to provide you with exceptional heart care, we have created designated Provider Care Teams. These Care Teams include your primary Cardiologist (physician) and Advanced Practice Providers (APPs- Physician Assistants and Nurse Practitioners) who all work together to provide you with the care you need, when you need it.   You may see any of the following providers on your designated Care Team at your next follow up: Marland Kitchen Dr Glori Bickers . Dr Loralie Champagne . Darrick Grinder, NP . Lyda Jester, PA . Audry Riles, PharmD   Please be sure to bring in all your medications bottles to every appointment.

## 2020-04-18 ENCOUNTER — Other Ambulatory Visit (INDEPENDENT_AMBULATORY_CARE_PROVIDER_SITE_OTHER): Payer: HMO

## 2020-04-18 ENCOUNTER — Telehealth: Payer: Self-pay | Admitting: *Deleted

## 2020-04-18 ENCOUNTER — Telehealth: Payer: Self-pay

## 2020-04-18 ENCOUNTER — Telehealth: Payer: Self-pay | Admitting: Internal Medicine

## 2020-04-18 DIAGNOSIS — J9611 Chronic respiratory failure with hypoxia: Secondary | ICD-10-CM

## 2020-04-18 NOTE — Telephone Encounter (Signed)
Error. Newman Pies

## 2020-04-18 NOTE — Telephone Encounter (Signed)
I have looked over her recent cardiology office visit note. Her blood sugar has been running high, so I don't want to give her steroids now. Please have her increase her oxygen to 3L Please see if she can get labs drawn- CBC w diff and CMET, BNP  for dx chronic respiratory failure with hypoxia For now keep current appointment with me. If she gets worse she may need to go to ER.

## 2020-04-18 NOTE — Telephone Encounter (Signed)
Pt stated that she is having a difficulty breathing just from doing minimal movement stated she just went to the doctors office yesterday and saw Dr. Aundra Dubin and stated her lungs sounds fine however she has been seeing a decrease in her breathing from the past wk and stated that after midnight into early morning when she is getting her day started that's when it seems to be the worse and additionally she has been using her rescue inhaler of the Trelegy and stated she doesn't believe it has been doing much help for her and she is also getting more hungry pt also has a history of diabetes. [place] regard [phone]

## 2020-04-18 NOTE — Telephone Encounter (Signed)
Called and spoke with patient, advised of recommendations per Dr. Annamaria Boots.  She verbalized understanding.  Orders placed per Dr. Annamaria Boots.  She is having some transportation issues as she is no longer able to drive and depends on others for transportation.  She will come on 04/19/20 after 12 pm, advised not to come between 1-2 pm as the lab tech goes to lunch during that time.  She verbalized understanding.  Nothing further needed.

## 2020-04-18 NOTE — Telephone Encounter (Signed)
Information was sent through CoverMyMeds for [place] for Ipratropium-Albuterol 0.5-2.5 Solution.  Awaiting determination.  Spoke with [employer] will need to get 90 ml as this is how the box is distributed.  Unable to get smaller amounts. Sander Nephew, RN 04/18/2020 12:11 PM.

## 2020-04-18 NOTE — Telephone Encounter (Signed)
Spoke with the pt  She is c/o increased SOB for the past 1-2 wks  She states she stays up late, feels more SOB late at night and has been having to use her proair which does help  She also feels SOB when she wakes in the am States that she got up this am and "didn't think I was going to make it to the bathroom" due to [place]  She is still taking her Trelegy in the am every day  She states that she is not having any increased cough, wheezing, chest tightness, f/c/s She states lung sounded clear on exam by cards 04/18/19  Please advise thanks!  Allergies  Allergen Reactions  . Lorazepam   . Lorazepam Other (See Comments)    Patient's sister noted that ativan caused the patient to become extremely confused during hospitalization 09/2010; tolerates Xanax  . Morphine And Related Other (See Comments)    Injection site reaction  . Oxycontin [Oxycodone] Other (See Comments)    headache  . Tramadol Hcl Swelling    Ankle swelling   Current Outpatient Medications on File Prior to Visit  Medication Sig Dispense Refill  . albuterol (PROAIR HFA) 108 (90 Base) MCG/ACT inhaler Inhale 2 puffs into the lungs every 6 (six) hours as needed for shortness of breath. 18 g 5  . ALPRAZolam (XANAX) 1 MG tablet TAKE 1 TABLET BY MOUTH EVERY NIGHT AT BEDTIME AS NEEDED FOR INSOMNIA. MAY TAKE 1/2 TABLET BY MOUTH DURING THE DAY FOR ANXIETY 55 tablet 4  . benzonatate (TESSALON) 100 MG capsule Take 1 capsule (100 mg total) by mouth 3 (three) times daily as needed for cough. 90 capsule 11  . Blood Glucose Monitoring Suppl (ONETOUCH VERIO) W/DEVICE KIT 1 each by Does not apply route 4 (four) times daily. 1 kit 0  . buPROPion (WELLBUTRIN XL) 150 MG 24 hr tablet Take 1 tablet (150 mg total) by mouth every morning for 365 doses. 90 tablet 3  . chlorpheniramine (CHLOR-TRIMETON) 4 MG tablet Take 4 mg by mouth daily as needed for allergies.    . Continuous Blood Gluc Sensor (FREESTYLE LIBRE 2 SENSOR) MISC 1 each by Does not apply  route 6 (six) times daily. To read 6 times daily with change of sensor every 2 weeks, 90 days worth with 3 refills 7 each 3  . DULoxetine (CYMBALTA) 60 MG capsule Take 1 capsule (60 mg total) by mouth 2 (two) times daily. 60 capsule 3  . fluticasone (FLONASE) 50 MCG/ACT nasal spray Place 2 sprays into both nostrils daily. 54 g 3  . Fluticasone-Umeclidin-Vilant (TRELEGY ELLIPTA) 100-62.5-25 MCG/INH AEPB Inhale 1 puff into the lungs daily. 60 each 6  . furosemide (LASIX) 80 MG tablet TAKE 1 TABLET BY MOUTH EVERY MORNING AND TAKE 1/2 TABLET EVERY EVENING.  Needs appointment for further refill. 60 tablet 0  . gabapentin (NEURONTIN) 300 MG capsule Take 2 capsules (600 mg total) by mouth 3 (three) times daily. 540 capsule 3  . glucose blood (ONETOUCH VERIO) test strip Use to check blood sugar up to 7 times weekly. diag code E11.40. Insulin dependent 25 each 8  . [START ON 04/19/2020] HYDROcodone-acetaminophen (NORCO) 7.5-325 MG tablet Take 1 tablet by mouth every 6 (six) hours as needed for severe pain. 120 tablet 0  . hydrocortisone cream 1 % APPLY EXTERNALLY TO THE AFFECTED AREA TWICE DAILY 28.35 g 2  . Insulin Degludec-Liraglutide (XULTOPHY) 100-3.6 UNIT-MG/ML SOPN Inject 45 Units into the skin daily for 30 doses. 3 mL 3  .  Insulin Pen Needle 32G X 4 MM MISC Use to inject insulin up to 6 times a day 500 each 3  . Insulin Syringe-Needle U-100 31G X 15/64" 0.5 ML MISC Use to inject insulin up to 4 times a day 400 each 3  . ipratropium-albuterol (DUONEB) 0.5-2.5 (3) MG/3ML SOLN 3 ml as needed    . ipratropium-albuterol (DUONEB) 0.5-2.5 (3) MG/3ML SOLN Take 3 mLs by nebulization every 6 (six) hours as needed for up to 28 days. 24 mL 3  . Lancets Misc. (ACCU-CHEK FASTCLIX LANCET) KIT Check your blood 4 times a day dx code 250.00 insulin requiring 1 kit 2  . lidocaine (LIDODERM) 5 % Place 1 patch onto the skin every 12 (twelve) hours. Remove & Discard patch within 12 hours or as directed by MD 60 patch 2  .  metoprolol succinate (TOPROL-XL) 50 MG 24 hr tablet Take 1 tablet (50 mg total) by mouth daily. Take with or immediately following a meal. 90 tablet 3  . nystatin (NYSTATIN) powder Apply 1 application topically 3 (three) times daily as needed. 60 g 4  . omeprazole (PRILOSEC) 40 MG capsule Take 1 capsule (40 mg total) by mouth in the morning and at bedtime. 180 capsule 1  . OXYGEN Inhale 2 L into the lungs at bedtime.    . potassium chloride SA (KLOR-CON) 20 MEQ tablet Take 2 tabs by mouth every morning and 1 tab every evening to replace potassium lost from lasix use. 270 tablet 3  . PRESCRIPTION MEDICATION Inhale into the lungs at bedtime. CPAP    . rosuvastatin (CRESTOR) 20 MG tablet TAKE 1 TABLET BY MOUTH ONCE EVERY NIGHT AT BEDTIME 90 tablet 3  . terbinafine (LAMISIL) 1 % cream Apply cream to clean and dry feet twice a day until gone.  Apply to toes, and to sides, heels, and soles of feet. 30 g 1  . warfarin (COUMADIN) 2.5 MG tablet Take 2.74m warfarin on We/Th/Sa/Su; 072mon Mondays; 1.2519m1/2 x 2.5mg70mblet) on Tu/Fri. 20 tablet 1   No current facility-administered medications on file prior to visit.

## 2020-04-18 NOTE — Telephone Encounter (Signed)
Pt is requesting a call back about her medication she stated she got a 90 days supply and only wants 30 days . She also stated that one of her medication is needing a prior [place]

## 2020-04-18 NOTE — Progress Notes (Signed)
PCP: Angelica Pou, MD HF Cardiology: Dr. Aundra Dubin  70 y.o. with COPD, mitral stenosis s/p bioprosthetic MV replacement, paroxysmal atrial fibrillation, GI bleeding, and pulmonary hypertension/RV failure presents for cardiology followup.  She was admitted with GI bleeding, thought to be AVMs, in 2/18 and warfarin was stopped.  She was admitted in 3/18 with chest pain, dyspnea, and elevated troponin.  She had not been taking her Lasix regularly.  Coronary angiography showed nonobstructive CAD, suspect demand ischemia. RHC showed elevated right and left heart filling pressures and severe pulmonary hypertension.  V/Q scan showed no chronic PE, and PFTs showed severe obstructive lung disease.  Echo showed severe RV dilation with mildly decreased systolic function.  She was diuresed and eventually discharged to [facility].   She was admitted in 2/19 with anemia, got 2 units PRBCs.    Echo in 5/19 with EF 60-65%, mildly decreased RV systolic function, PASP 49 mmHg, normal bioprosthetic mitral valve. PYP scan (4/21) grade 2, H/CL 1.3 (equivocal).  PYP scan repeated 10/21, grade 1 with H/CL 1.04. Echo in 12/21 with EF 60-65%, mild LVH, normal RV, PASP 35 mmHg, s/p bioprosthetic mitral valve with mean gradient 4, IVC normal.   She returns for followup of diastolic CHF.  Still smoking 1 ppd.  No dyspnea walking in her house, gets short of breath walking long distance.  No lightheadedness.  Weight is up 4 lbs on our scale but is stable at home. She is off Jardiance due to yeast infections.  She has atypical right-sided chest pain (occasional episodes at night).  Still with bilateral leg neuropathic pain.   Labs (3/18): K 4.8 => 3.9, creatinine 1.2 => 1.09, hgb 10.2 => 10.7, BNP 131 Labs (5/18): K 4, creatinine 1.0 Labs (2/19): K 4.5, creatinine 0.94, transferrin saturation 3%.  Labs (3/19): hgb 11.3 Labs (4/19): K 4.1, creatinine 1.0, LDL 36 Labs (1/10): creatinine 0.85 Labs (7/20): hgb 10.7  Labs  (10/20): K 4, creatinine 1.13, LDL 48 Labs (11/20): hgb 9.2 Labs (1/21): K 4.2, creatinine 1.28 Labs (2/21): hgb 10.7 Labs (8/21): LDL 46, HDL 35, K 4.2, creatinine 1.11 Labs (3/22): LDL 51, TGs 171, K 4.3, creatinine 0.99  ECG (personally reviewed): NSR, 1st degree AVB 268 msec  PMH: 1. Mitral stenosis: s/p bioprosthetic mitral valve replacement.  2. COPD: Severe by 3/18 PFTs.  She is still smoking.  3. Atrial fibrillation: Paroxysmal. Has been off warfarin since GI bleed in 2/18.  4. OSA: Uses CPAP.  5. Type II diabetes. 6. Pulmonary hypertension/RV failure: Suspect primarily group 3 PH due to underlying lung disease (COPD).  - RHC (3/18) with mean RA 14, PA 77/28 mean 47, mean PCWP 23, CI 2.6, PVR 4.9.  - Echo (3/18): EF 60-65%, severe RV dilation with mildly decreased RV systolic function, PASP 76 mmHg.  Bioprosthetic MV looks ok.  - V/Q scan (3/18): No evidence for chronic PE.  - PFTs (3/18): Severe obstructive airways disease.  - Echo in 5/19 with EF 60-65%, mildly decreased RV systolic function, PASP 49 mmHg, normal bioprosthetic mitral valve.  - PYP scan (10/21) with grade 1, H/CL 1.04 => unlikely TTR amyloidosis.  - Echo (12/21): EF 60-65%, mild LVH, normal RV, PASP 35 mmHg, s/p bioprosthetic mitral valve with mean gradient 4, IVC normal 7. Anemia: Fe deficiency and anemia of chronic disease.   - Suspect GI AVMs as cause of 2/18 bleed . Coumadin stopped.  8. CAD: LHC (3/18) with 30% LM, 40% proximal RCA.  9. Hyperlipidemia 10. PAD:  ABIs 3/21 with 0.85 R, 1.03 L. 50-74% right SFA stenosis, 30-49% left SFA stenosis.  - ABIs (12/21): 0.74 right, 0.99 left.  11. Carotid stenosis: Carotid dopplers (3/21) with 40-59% BICA.   SH: Lives alone in [place].  Active smoker.  No ETOH.   Family History  Problem Relation Age of Onset  . Peptic Ulcer Disease Father   . Heart attack Father 11       Died of MI at age 21  . Heart attack Brother 37       Died of MI at age 43  .  Obesity Brother   . Pneumonia Mother   . Healthy Sister   . Lupus Daughter   . Obsessive Compulsive Disorder Daughter    ROS: All systems reviewed and negative except as per HPI.   Current Outpatient Medications  Medication Sig Dispense Refill  . albuterol (PROAIR HFA) 108 (90 Base) MCG/ACT inhaler Inhale 2 puffs into the lungs every 6 (six) hours as needed for shortness of breath. 18 g 5  . ALPRAZolam (XANAX) 1 MG tablet TAKE 1 TABLET BY MOUTH EVERY NIGHT AT BEDTIME AS NEEDED FOR INSOMNIA. MAY TAKE 1/2 TABLET BY MOUTH DURING THE DAY FOR ANXIETY 55 tablet 4  . benzonatate (TESSALON) 100 MG capsule Take 1 capsule (100 mg total) by mouth 3 (three) times daily as needed for cough. 90 capsule 11  . Blood Glucose Monitoring Suppl (ONETOUCH VERIO) W/DEVICE KIT 1 each by Does not apply route 4 (four) times daily. 1 kit 0  . buPROPion (WELLBUTRIN XL) 150 MG 24 hr tablet Take 1 tablet (150 mg total) by mouth every morning for 365 doses. 90 tablet 3  . chlorpheniramine (CHLOR-TRIMETON) 4 MG tablet Take 4 mg by mouth daily as needed for allergies.    . Continuous Blood Gluc Sensor (FREESTYLE LIBRE 2 SENSOR) MISC 1 each by Does not apply route 6 (six) times daily. To read 6 times daily with change of sensor every 2 weeks, 90 days worth with 3 refills 7 each 3  . DULoxetine (CYMBALTA) 60 MG capsule Take 1 capsule (60 mg total) by mouth 2 (two) times daily. 60 capsule 3  . fluticasone (FLONASE) 50 MCG/ACT nasal spray Place 2 sprays into both nostrils daily. 54 g 3  . Fluticasone-Umeclidin-Vilant (TRELEGY ELLIPTA) 100-62.5-25 MCG/INH AEPB Inhale 1 puff into the lungs daily. 60 each 6  . furosemide (LASIX) 80 MG tablet TAKE 1 TABLET BY MOUTH EVERY MORNING AND TAKE 1/2 TABLET EVERY EVENING.  Needs appointment for further refill. 60 tablet 0  . gabapentin (NEURONTIN) 300 MG capsule Take 2 capsules (600 mg total) by mouth 3 (three) times daily. 540 capsule 3  . glucose blood (ONETOUCH VERIO) test strip Use to  check blood sugar up to 7 times weekly. diag code E11.40. Insulin dependent 25 each 8  . [START ON 04/19/2020] HYDROcodone-acetaminophen (NORCO) 7.5-325 MG tablet Take 1 tablet by mouth every 6 (six) hours as needed for severe pain. 120 tablet 0  . hydrocortisone cream 1 % APPLY EXTERNALLY TO THE AFFECTED AREA TWICE DAILY 28.35 g 2  . Insulin Degludec-Liraglutide (XULTOPHY) 100-3.6 UNIT-MG/ML SOPN Inject 45 Units into the skin daily for 30 doses. 3 mL 3  . Insulin Pen Needle 32G X 4 MM MISC Use to inject insulin up to 6 times a day 500 each 3  . Insulin Syringe-Needle U-100 31G X 15/64" 0.5 ML MISC Use to inject insulin up to 4 times a day 400 each 3  .  ipratropium-albuterol (DUONEB) 0.5-2.5 (3) MG/3ML SOLN 3 ml as needed    . ipratropium-albuterol (DUONEB) 0.5-2.5 (3) MG/3ML SOLN Take 3 mLs by nebulization every 6 (six) hours as needed for up to 28 days. 24 mL 3  . Lancets Misc. (ACCU-CHEK FASTCLIX LANCET) KIT Check your blood 4 times a day dx code 250.00 insulin requiring 1 kit 2  . lidocaine (LIDODERM) 5 % Place 1 patch onto the skin every 12 (twelve) hours. Remove & Discard patch within 12 hours or as directed by MD 60 patch 2  . metoprolol succinate (TOPROL-XL) 50 MG 24 hr tablet Take 1 tablet (50 mg total) by mouth daily. Take with or immediately following a meal. 90 tablet 3  . nystatin (NYSTATIN) powder Apply 1 application topically 3 (three) times daily as needed. 60 g 4  . omeprazole (PRILOSEC) 40 MG capsule Take 1 capsule (40 mg total) by mouth in the morning and at bedtime. 180 capsule 1  . OXYGEN Inhale 2 L into the lungs at bedtime.    . potassium chloride SA (KLOR-CON) 20 MEQ tablet Take 2 tabs by mouth every morning and 1 tab every evening to replace potassium lost from lasix use. 270 tablet 3  . PRESCRIPTION MEDICATION Inhale into the lungs at bedtime. CPAP    . rosuvastatin (CRESTOR) 20 MG tablet TAKE 1 TABLET BY MOUTH ONCE EVERY NIGHT AT BEDTIME 90 tablet 3  . terbinafine (LAMISIL)  1 % cream Apply cream to clean and dry feet twice a day until gone.  Apply to toes, and to sides, heels, and soles of feet. 30 g 1  . warfarin (COUMADIN) 2.5 MG tablet Take 2.30m warfarin on We/Th/Sa/Su; 024mon Mondays; 1.252m1/2 x 2.5mg73mblet) on Tu/Fri. 20 tablet 1   No current facility-administered medications for this encounter.   BP 104/60   Pulse 62   Wt 77.1 kg (169 lb 14.4 oz)   SpO2 96%   BMI 31.08 kg/m  General: NAD Neck: No JVD, no thyromegaly or thyroid nodule.  Lungs: Distant BS CV: Nondisplaced PMI.  Heart regular S1/S2, no S3/S4, no murmur.  No peripheral edema.  No carotid bruit.  Unable to palpate pedal pulses.  Abdomen: Soft, nontender, no hepatosplenomegaly, no distention.  Skin: Intact without lesions or rashes.  Neurologic: Alert and oriented x 3.  Psych: Normal affect. Extremities: No clubbing or cyanosis.  HEENT: Normal.   Assessment/Plan: 1. Pulmonary hypertension: I suspect that this is primarily group 2 PH (pulmonary venous hypertension from LV diastolic dysfunction) and group 3 PH (severe COPD, OSA).  V/Q scan showed no evidence for chronic PE.  I do not think that she will benefit from selective pulmonary vasodilators.  I think that the key here will continue to be good diuresis + use of oxygen and CPAP.  2. Chronic diastolic CHF with prominent RV dysfunction in the setting of severe COPD.  PYP scan in 10/21 is NOT suggestive of TTR amyloidosis.  Echo in 12/21 with EF 65-70%, normal RV.  NYHA class III symptoms (dyspnea is likely a combination of CHF and COPD), stable recently.  She does not look volume overloaded.  - She stopped Jardiance with a severe yeast infection.    - Continue Lasix 80 qam/40 qpm.     3. Atrial fibrillation: Paroxysmal.  She is in NSR today.  She had been off anticoagulation due to GI bleeding, now back on warfarin with INR goal 1.5-2.5.  She had GI bleeding in 2/19, no recent melena.  Recent CBC stable.  - Keep warfarin with INR  1.5-2.5 for now.  - With most recent data regarding NOACs and bioprosthetic valves, I suggested in the past that she switch to apixaban given GI bleeding history. She has wanted to stay on warfarin.  4. h/o GI bleeding: Possible GI AVM. Admitted with anemia again in 2/19, got 2 units PRBCs.  No overt bleeding now.  Stable CBC in 3/22.  5. COPD: Severe by PFTs. Unfortunately, she is still smoking.  I encouraged her to quit again.  6. CAD: Nonobstructive. She is on a statin.  - Good lipids in 3/22.  7. PAD: Hard to separate diabetic neuropathy pain from PAD pain. ABIs mildly worse on right in 12/21.  - Needs to quit smoking.  - Continue statin and warfarin.   Followup in 4 months.   [name] 04/18/2020

## 2020-04-18 NOTE — Telephone Encounter (Signed)
TC to patient.  She states she has been on the phone for 2 days with her insurance company and they are requesting a PA on the [place].    She wants to make sure it is for a 30 day supply because last time she took this she ended up throwing a lot away that she didn't use.  RN informed pt that per Dr. Lattie Corns, it is for #8 (3 ml duoneb) with refills.    Will send to [facility]  to check on PA Thank you SChaplin, RN,BSN

## 2020-04-19 LAB — BRAIN NATRIURETIC PEPTIDE: Pro B Natriuretic peptide (BNP): 132 pg/mL — ABNORMAL HIGH (ref 0.0–100.0)

## 2020-04-19 LAB — CBC WITH DIFFERENTIAL/PLATELET
Basophils Absolute: 0 10*3/uL (ref 0.0–0.1)
Basophils Relative: 0.6 % (ref 0.0–3.0)
Eosinophils Absolute: 0.1 10*3/uL (ref 0.0–0.7)
Eosinophils Relative: 1.3 % (ref 0.0–5.0)
HCT: 29.8 % — ABNORMAL LOW (ref 36.0–46.0)
Hemoglobin: 9.6 g/dL — ABNORMAL LOW (ref 12.0–15.0)
Lymphocytes Relative: 21.3 % (ref 12.0–46.0)
Lymphs Abs: 1.6 10*3/uL (ref 0.7–4.0)
MCHC: 32 g/dL (ref 30.0–36.0)
MCV: 83.4 fl (ref 78.0–100.0)
Monocytes Absolute: 0.8 10*3/uL (ref 0.1–1.0)
Monocytes Relative: 10.2 % (ref 3.0–12.0)
Neutro Abs: 5.1 10*3/uL (ref 1.4–7.7)
Neutrophils Relative %: 66.6 % (ref 43.0–77.0)
Platelets: 216 10*3/uL (ref 150.0–400.0)
RBC: 3.58 Mil/uL — ABNORMAL LOW (ref 3.87–5.11)
RDW: 17.2 % — ABNORMAL HIGH (ref 11.5–15.5)
WBC: 7.6 10*3/uL (ref 4.0–10.5)

## 2020-04-19 LAB — COMPREHENSIVE METABOLIC PANEL
ALT: 6 U/L (ref 0–35)
AST: 10 U/L (ref 0–37)
Albumin: 3.8 g/dL (ref 3.5–5.2)
Alkaline Phosphatase: 45 U/L (ref 39–117)
BUN: 16 mg/dL (ref 6–23)
CO2: 33 mEq/L — ABNORMAL HIGH (ref 19–32)
Calcium: 9.4 mg/dL (ref 8.4–10.5)
Chloride: 100 mEq/L (ref 96–112)
Creatinine, Ser: 1.23 mg/dL — ABNORMAL HIGH (ref 0.40–1.20)
GFR: 44.37 mL/min — ABNORMAL LOW (ref >60.00)
Glucose, Bld: 193 mg/dL — ABNORMAL HIGH (ref 70–99)
Potassium: 4.5 mEq/L (ref 3.5–5.1)
Sodium: 140 mEq/L (ref 135–145)
Total Bilirubin: 0.3 mg/dL (ref 0.2–1.2)
Total Protein: 6.7 g/dL (ref 6.0–8.3)

## 2020-04-20 NOTE — Progress Notes (Signed)
Called and went over lab results per Dr Annamaria Boots with patient. All questions answered and patient expressed understanding. Confirmed upcoming scheduled appointment with Dr Annamaria Boots for 05/03/20 at 9:30am. Patient stated she does not need anything else at this time and expressed understanding to call if anything changes or if she needs anything.

## 2020-04-23 ENCOUNTER — Other Ambulatory Visit (HOSPITAL_COMMUNITY): Payer: Self-pay | Admitting: Cardiology

## 2020-04-23 DIAGNOSIS — I5032 Chronic diastolic (congestive) heart failure: Secondary | ICD-10-CM

## 2020-04-24 ENCOUNTER — Telehealth: Payer: Self-pay | Admitting: Internal Medicine

## 2020-04-24 ENCOUNTER — Telehealth: Payer: Self-pay

## 2020-04-24 NOTE — Telephone Encounter (Signed)
I have called Ms. Newey and will place a phone call note.  THank you!

## 2020-04-24 NOTE — Telephone Encounter (Signed)
Requesting to speak with Dr. [name]. Please call pt back.

## 2020-04-24 NOTE — Telephone Encounter (Signed)
REturned call to Ms. Sportsman who wished to discuss problems with her new pain medication dose (hydrocodone/APAP increased from 5 mg to 7.5 mg).  She clarifies my initial understanding regarding her previous dose - was taking 5 mg two po bid (her preference, rather than 10 mg one po bid); I had presumed incorrected that she was usually taking 5 mg one bid and had increased dose to 7.5 mg to be taken 1 po Q6 hrs prn.  7.5 mg was inadequate.  SHe is now taking 1 1/2 tab bid which is acceptably controlling pain.  We agreed to remain on this (she has a pill cutter) for this month then reassess.  She doesn't want to take her medicine more than twice a day if possible.  I noted that she had called pulmonary office last week c/o dypsnea; she is doing much better now.  FEels she may have had a panic attack, which she had not experienced in " very long time'.    Breathing is back to her baseline.

## 2020-04-24 NOTE — Telephone Encounter (Signed)
Return pt's call - stated she needs to talk to Dr [name] about her pain medication. Stated Dr [name] change her Hydrocodone from 5/325 mg take 2 tabs To 7.5/325 mg  take1 tablet which is not helping her pain. Stated she thinks she had withdrawal symptoms, sweating and tremors, on Sunday. So now she's taking 1.5 tab of 7.5/325 mg and she wants to talk to Dr [name]. Thanks

## 2020-04-25 ENCOUNTER — Ambulatory Visit: Payer: HMO | Admitting: Behavioral Health

## 2020-04-25 ENCOUNTER — Other Ambulatory Visit: Payer: Self-pay

## 2020-04-25 DIAGNOSIS — F321 Major depressive disorder, single episode, moderate: Secondary | ICD-10-CM

## 2020-04-25 NOTE — BH Specialist Note (Signed)
Integrated Behavioral Health via Telemedicine Visit  04/25/2020 [name] [id number]  Number of Integrated Behavioral Health visits: 5/6 Session Start time: 11:00am  Session End time: 11:30am Total time: 30  Referring Provider: Dr. Dorian Pod, MD Patient/Family location: Pt at home in private [facility] Provider location: [facility] Office All persons participating in visit: Pt & Clinician Types of Service: Individual psychotherapy  I connected with [name] and/or [name]'s self via  Telephone or Video Enabled Telemedicine Application  (Video is Caregility application) and verified that I am speaking with the correct person using two identifiers. Discussed confidentiality: Yes   I discussed the limitations of telemedicine and the availability of in person appointments.  Discussed there is a possibility of technology failure and discussed alternative modes of communication if that failure occurs.  I discussed that engaging in this telemedicine visit, they consent to the provision of behavioral healthcare and the services will be billed under their insurance.  Patient and/or legal guardian expressed understanding and consented to Telemedicine visit: Yes   Presenting Concerns: Patient and/or family reports the following symptoms/concerns: anx about upcoming health status tests/procedures Duration of problem: ongoing; Severity of problem: mild  Patient and/or Family's Strengths/Protective Factors: Social and Emotional competence and Concrete supports in place (healthy food, safe environments, etc.)  Goals Addressed: Patient will: 1.  Reduce symptoms of: anxiety and stress  2.  Increase knowledge and/or ability of: coping skills and stress reduction  3.  Demonstrate ability to: Increase healthy adjustment to current life circumstances and Increase adequate support systems for patient/family  Progress towards  Goals: Ongoing  Interventions: Interventions utilized:  Solution-Focused Strategies and Supportive Counseling Standardized Assessments completed: Not Needed  Patient and/or Family Response: Pt receptive to call today & wants future appts  Assessment: Patient currently experiencing health status issues w/adjustments.   Patient may benefit from cont'd check-ins.  Plan: 1. Follow up with behavioral health clinician on : 2-3 wks for 30 min on telehealth 2. Behavioral recommendations: Cont to f/u with procedures for health 3. Referral(s): [facility] (In Clinic)  I discussed the assessment and treatment plan with the patient and/or parent/guardian. They were provided an opportunity to ask questions and all were answered. They agreed with the plan and demonstrated an understanding of the instructions.   They were advised to call back or seek an in-person evaluation if the symptoms worsen or if the condition fails to improve as anticipated.  Donnetta Hutching, LMFT

## 2020-04-26 ENCOUNTER — Telehealth: Payer: Self-pay

## 2020-04-26 DIAGNOSIS — J449 Chronic obstructive pulmonary disease, unspecified: Secondary | ICD-10-CM | POA: Diagnosis not present

## 2020-04-26 DIAGNOSIS — I509 Heart failure, unspecified: Secondary | ICD-10-CM | POA: Diagnosis not present

## 2020-04-26 DIAGNOSIS — J439 Emphysema, unspecified: Secondary | ICD-10-CM | POA: Diagnosis not present

## 2020-04-26 DIAGNOSIS — I270 Primary pulmonary hypertension: Secondary | ICD-10-CM | POA: Diagnosis not present

## 2020-04-26 NOTE — Telephone Encounter (Signed)
Hi Dr. Elie Confer,  We are receiving refill request for [name]'s Warfarin 2.25m.  I did not see recent labs on her, but TLinus Orntold me she thought the patient might have her own POC?   Refills have been requested from [employer] Thank you! Jayme Mednick

## 2020-04-27 ENCOUNTER — Telehealth: Payer: Self-pay | Admitting: Dietician

## 2020-04-27 DIAGNOSIS — I270 Primary pulmonary hypertension: Secondary | ICD-10-CM | POA: Diagnosis not present

## 2020-04-27 DIAGNOSIS — J439 Emphysema, unspecified: Secondary | ICD-10-CM | POA: Diagnosis not present

## 2020-04-27 DIAGNOSIS — J449 Chronic obstructive pulmonary disease, unspecified: Secondary | ICD-10-CM | POA: Diagnosis not present

## 2020-04-27 NOTE — Telephone Encounter (Signed)
Call transferred. States she felled; she's not sure what happened. When she felled she hurt the right knee; states it's very red; small amount of bleeding, unsure if it's swollen. She did call a friend, who's there now. Advised pt to apply ice and rest ; go to UC or ED if becomes worse. States she has her pain pill to take for the pain. And to call back on Monday if she needs an appt. Voice understanding.

## 2020-04-27 NOTE — Telephone Encounter (Signed)
She fell and wonders what to do. Knee is very red and hurts bad.  Call transfered to triage nurse.

## 2020-04-27 NOTE — Telephone Encounter (Signed)
Thank you, I agree.

## 2020-04-28 DIAGNOSIS — J449 Chronic obstructive pulmonary disease, unspecified: Secondary | ICD-10-CM | POA: Diagnosis not present

## 2020-04-28 DIAGNOSIS — I050 Rheumatic mitral stenosis: Secondary | ICD-10-CM | POA: Diagnosis not present

## 2020-04-28 DIAGNOSIS — I509 Heart failure, unspecified: Secondary | ICD-10-CM | POA: Diagnosis not present

## 2020-04-30 ENCOUNTER — Telehealth: Payer: Self-pay | Admitting: Internal Medicine

## 2020-04-30 ENCOUNTER — Encounter: Payer: Self-pay | Admitting: Internal Medicine

## 2020-04-30 ENCOUNTER — Telehealth: Payer: Self-pay | Admitting: Pharmacist

## 2020-04-30 ENCOUNTER — Other Ambulatory Visit: Payer: Self-pay | Admitting: Pharmacist

## 2020-04-30 DIAGNOSIS — I480 Paroxysmal atrial fibrillation: Secondary | ICD-10-CM

## 2020-04-30 MED ORDER — WARFARIN SODIUM 2.5 MG PO TABS
ORAL_TABLET | ORAL | 2 refills | Status: DC
Start: 2020-04-30 — End: 2020-08-17

## 2020-04-30 NOTE — Telephone Encounter (Signed)
agree

## 2020-04-30 NOTE — Telephone Encounter (Signed)
Lm for patient.  

## 2020-04-30 NOTE — Telephone Encounter (Signed)
Requests refill on her warfarin. Will send to her pharmacy.

## 2020-04-30 NOTE — Telephone Encounter (Signed)
Patient texted results of PST FS POC INR determination from today. INR = 1.8 (target goal 1.5 - 2.5) on total of 12.59m warfarin/wk. Continue same regimen. Repeat PST FS POC INR on 28-MAR-22. No bleeding reported. Refill authorization sent to her pharmacy of record per her request. 2.546mon Su/Th/Fr/Sa; 43m543mach Monday; 1/2 x 2.5mg31m.25mg69m Tu/We.

## 2020-05-01 NOTE — Telephone Encounter (Signed)
Patient states she knows the HST would be without oxygen and CPAP . However, she wants to put that on hold until she knows or understands what has shown up on the CT scan.  She will talk at her appointment on 05/03/20

## 2020-05-01 NOTE — Telephone Encounter (Signed)
Pt returning call from [place]. Please advise   Call back [phone]

## 2020-05-01 NOTE — Telephone Encounter (Signed)
Will route message to [facility] and Dr. Annamaria Boots to address with patient at appointment on 05/03/20 with CY on if HST can be done without oxygen

## 2020-05-01 NOTE — Telephone Encounter (Signed)
Pt returning a phone call. Pt can be reached at [phone].

## 2020-05-01 NOTE — Telephone Encounter (Signed)
Yes- the intention is to revisit both her OSA and her oxygen status with a home study on room air.

## 2020-05-02 ENCOUNTER — Telehealth: Payer: Self-pay

## 2020-05-02 NOTE — Telephone Encounter (Signed)
ATC patient to see if she currently uses CPAP and if she does if she has an SD card and who her DME is for her O2 and CPAP. LMTCB

## 2020-05-02 NOTE — Progress Notes (Signed)
HPI F Smoker for evaluation of Nocturnal Hypoxemia courtesy of Dr [name] with original question of OSA, complicated by  Aortic  Atherosclerosis, AVM GI tract, CHF, Mitral Valve Replacement,  Cardiac Amyloid, PAD, Venous Insufficiency, HTN, Migraine, COPD, Pulmonary Hypertension, GERD, DM2, Hx Renal Carcinoma, Depression, Tobacco abuse, Obesity,  NPSG at [facility] 01/18/03- AHI 8.6/ hr -Nocturnal polysomnography (06/2009): Moderate sleep apnea/ hypopnea syndrome , AHI 17.8 per hour with nonpositional hypopneas. CPAP titration to 12 CWP, AHI 2.4 per hour.Body weight 183 lb NPSG 07/22/19- AHI 0.0/ htr, desat to 83%, with mean 93% after adding O2 2L, body weight 170 lbs PFT 04/23/16- Mod severe obstr F/F 0.69, FEV1 1.05/ 48%, insig resp to BD, TLC 76%, DLCO 52%  ========================================================================== 02/02/20- 39 yoF Smoker for evaluation of Nocturnal Hypoxemia courtesy of Dr [name] with original question of OSA Medical problem list includes Aortic  Atherosclerosis, AVM GI tract, CHF, Mitral Valve Replacement,  Cardiac Amyloid, PAD, Venous Insufficiency, HTN, Migraine, COPD, Pulmonary Hypertension, GERD, DM2, Hx Renal Carcinoma, Depression, Tobacco abuse, Obesity,  NPSG at [facility] 01/18/03- AHI 8.6/ hr -Nocturnal polysomnography (06/2009): Moderate sleep apnea/ hypopnea syndrome , AHI 17.8 per hour with nonpositional hypopneas. CPAP titration to 12 CWP, AHI 2.4 per hour.Body weight 183 lbs On nocturnal CPAP via a small resMed Quattro full-face mask with heated humidifier. NPSG 07/22/19- AHI 0.0/ htr, desat to 83%, with mean 93% after adding O2 2L, body weight 170 lbs Epworth score- 15 Body weight today- 166 lbs Covid vax-2 Moderna Flu vax- had PFT 04/23/16- Mod severe obstr F/F 0.69, FEV1 1.05/ 48%, insig resp to BD, TLC 76%, DLCO 52% Proair hfa,  Advair 500, Spira,  -----Patient was last seen in 2014, had sleep study done 6/11 and was  told that she does not have sleep apnea.  Using Xanax 1 mg for sleep. Sleep is restless with frequent wakings. Tends to daytime drowsy, falling asleep in chair. 1/2 cup coffee. O2 2L sleep- x 19 years. Owns her concentrator. Has portable O2  Has home nebulizer. Current smoker with no interest in quitting.  CXR 11/13/17-  IMPRESSION: Minimal pulmonary vascular congestion. Calcific atherosclerotic disease and tortuosity of the aorta.  05/03/20- 84 yoF Smoker(1 ppd) for evaluation of Nocturnal Hypoxemia courtesy of Dr [name] with original question of OSA, complicated by  Aortic  Atherosclerosis, AVM GI tract, CHF, Mitral Valve Replacement,  Cardiac Amyloid, PAD, Venous Insufficiency, HTN, Migraine, COPD, Pulmonary Hypertension, GERD, DM2, Hx Renal Carcinoma, Depression, Tobacco abuse, Obesity, Anemia,  O2 2L -3 sleep- owns her concentrator and portable O2 We ordered HST to be done on room air, off CPAP to recheck after original NPSG- not done - Albuterol hfa, Trelegy 100, Neb Duoneb,  Body weight today-170 lbs Covid vax-3 Moderna Flu vax-had Labs 04/18/20- Hgb 9.6, CO2 33H, Glu 193, Creat 1.23, BNP 132,  ------Doing ok, no new issues right now Occ WASO to use rescue inhaler. Not prepared to try to stop smoking now. CXR reviewed with bronchiolitis, possible nodule. CXR 02/02/20- IMPRESSION: Prior median sternotomy and cardiac valve replacement. Heart size within normal limits. Atherosclerotic calcification of the aortic knob. Mild pulmonary vascular congestion Bibasilar interstitial prominence with subtle nodularity within the left mid to lower lung field which could represent foci of infection or inflammation. Underlying pulmonary nodule not excluded. Recommend radiographic follow-up to resolution.   ROS-see HPI   + = positive Constitutional:    weight loss, night sweats, fevers, chills, fatigue, lassitude. HEENT:    headaches, difficulty swallowing,  tooth/dental problems, sore  throat,       sneezing, itching, ear ache, nasal congestion, post nasal drip, snoring CV:    chest pain, orthopnea, PND, swelling in lower extremities, anasarca,                                   dizziness, palpitations Resp:   +shortness of breath with exertion or at rest.                productive cough,   non-productive cough, coughing up of blood.              change in color of mucus.  wheezing.   Skin:    rash or lesions. GI:  No-   heartburn, indigestion, abdominal pain, nausea, vomiting, diarrhea,                 change in bowel habits, loss of appetite GU: dysuria, change in color of urine, no urgency or frequency.   flank pain. MS:   joint pain, stiffness, decreased range of motion, back pain. Neuro-     nothing unusual Psych:  change in mood or affect.  depression or anxiety.   memory loss.  OBJ- Physical Exam      Arrival O2 sat today, room air , wheelchair 97% General- Alert, Oriented, Affect-appropriate, Distress- none acute, +odor of tobacco, + wheelchair, + room air Skin- rash-none, lesions- none, excoriation- none Lymphadenopathy- none Head- atraumatic            Eyes- Gross vision intact, PERRLA, conjunctivae and secretions clear            Ears- Hearing, canals-normal            Nose- Clear, no-Septal dev, mucus, polyps, erosion, perforation             Throat- Mallampati IV , mucosa clear , drainage- none, tonsils- atrophic, + edentulous Neck- flexible , trachea midline, no stridor , thyroid nl, carotid no bruit Chest - symmetrical excursion , unlabored           Heart/CV- RRR , no murmur , no gallop  , no rub, nl s1 s2                           - JVD- none , edema- none, stasis changes- none, varices- none           Lung- clear to P&A, wheeze- none, cough+ , dullness-none, rub- none           Chest wall-  Abd-  Br/ Gen/ Rectal- Not done, not indicated Extrem- cyanosis- none, clubbing, none, atrophy- none, strength- nl Neuro- grossly intact to  observation

## 2020-05-03 ENCOUNTER — Other Ambulatory Visit: Payer: Self-pay

## 2020-05-03 ENCOUNTER — Encounter: Payer: Self-pay | Admitting: Internal Medicine

## 2020-05-03 ENCOUNTER — Ambulatory Visit: Payer: HMO | Admitting: Internal Medicine

## 2020-05-03 ENCOUNTER — Telehealth: Payer: Self-pay

## 2020-05-03 ENCOUNTER — Ambulatory Visit (INDEPENDENT_AMBULATORY_CARE_PROVIDER_SITE_OTHER): Payer: HMO

## 2020-05-03 VITALS — BP 120/62 | HR 65 | Temp 97.8°F | Ht 62.0 in | Wt 170.4 lb

## 2020-05-03 DIAGNOSIS — J449 Chronic obstructive pulmonary disease, unspecified: Secondary | ICD-10-CM

## 2020-05-03 DIAGNOSIS — G4734 Idiopathic sleep related nonobstructive alveolar hypoventilation: Secondary | ICD-10-CM | POA: Diagnosis not present

## 2020-05-03 DIAGNOSIS — Z720 Tobacco use: Secondary | ICD-10-CM | POA: Diagnosis not present

## 2020-05-03 NOTE — Patient Instructions (Addendum)
Cancel HST  Order- reschedule Low Dose Screening CT scan   Dx Tobacco user, COPD mixed type  Order- CXR    Dx COPD mixed type  We can continue current meds  Go back to 2L oxygen for sleep  Please call if we can help

## 2020-05-03 NOTE — Telephone Encounter (Signed)
Pt is requesting a call back she wanted to  Speak about test results she got from another doctor  ( NOT WITH CONE )

## 2020-05-03 NOTE — Telephone Encounter (Signed)
Spoke with the pt  She states that she is currently using CPAP  Her SD card is "so old it won't work" She uses [employer] for O2 and CPAP  She states not sure if [facility] still needs this info since we decided today to postpone the sleep study  Will forward this back to [facility]

## 2020-05-04 NOTE — Telephone Encounter (Signed)
Was trying to get download and information for her appointment yesterday. Nothing further needed at this time.

## 2020-05-07 ENCOUNTER — Telehealth: Payer: Self-pay | Admitting: Pharmacist

## 2020-05-07 DIAGNOSIS — Z7901 Long term (current) use of anticoagulants: Secondary | ICD-10-CM | POA: Diagnosis not present

## 2020-05-07 DIAGNOSIS — I480 Paroxysmal atrial fibrillation: Secondary | ICD-10-CM | POA: Diagnosis not present

## 2020-05-07 NOTE — Telephone Encounter (Signed)
Patient texted me results of PST FS POC INR 1.7 (goal 1.5 - 2.5) on 12.20m warfarin per week. No bleeding; no signs/symptoms of embolic events. Dose to remain same. Repeat in one-week.

## 2020-05-09 ENCOUNTER — Ambulatory Visit: Payer: HMO | Admitting: Behavioral Health

## 2020-05-10 ENCOUNTER — Other Ambulatory Visit: Payer: Self-pay

## 2020-05-10 ENCOUNTER — Ambulatory Visit (HOSPITAL_COMMUNITY)
Admission: RE | Admit: 2020-05-10 | Discharge: 2020-05-10 | Disposition: A | Discharge status: Home / Self Care | Payer: HMO | Source: Ambulatory Visit | Attending: Cardiology | Admitting: Cardiology

## 2020-05-10 ENCOUNTER — Telehealth: Payer: Self-pay | Admitting: Dietician

## 2020-05-10 DIAGNOSIS — I6523 Occlusion and stenosis of bilateral carotid arteries: Secondary | ICD-10-CM

## 2020-05-10 DIAGNOSIS — I6529 Occlusion and stenosis of unspecified carotid artery: Secondary | ICD-10-CM | POA: Diagnosis not present

## 2020-05-10 NOTE — Telephone Encounter (Signed)
Says the reader for her [name] went nuts. She called [employer] and they are sending a new one.

## 2020-05-14 ENCOUNTER — Telehealth: Payer: Self-pay | Admitting: Pharmacist

## 2020-05-14 NOTE — Telephone Encounter (Signed)
Patient texted me results of PST FS POC INR = 2.0 (target 1.5 - 2.5) on 12.15m TOTAL DOSE/WK. As: 1x2.537mon Su/Th/Fr/Sa; 1/2 x 2.5 mg (1.2529mn Tu/Wed; Zero milligrams on Mondays. Weekly INR. No bleeding she states.

## 2020-05-17 ENCOUNTER — Encounter: Payer: Self-pay | Admitting: *Deleted

## 2020-05-17 NOTE — Progress Notes (Signed)
Dear PCP Your patient is scheduled for a Medicare annual wellness visit with the RN.  Please create a new encounter, use the smart phrase [place], fill out the "Personalized health plan", "Things that may be affecting your health", and "Medicare covered preventative screenings and services".  Please do not address every item in the preventative screenings and services but the ones that will be most meaningful and relevant to your patient. Please sign and route the form within the next 7 days to [facility].

## 2020-05-21 ENCOUNTER — Telehealth: Payer: Self-pay | Admitting: Pharmacist

## 2020-05-21 NOTE — Telephone Encounter (Signed)
Patient reports result of FS POC PST INR = 2.2. Goal is 1.5 - 2.5. She does not report any signs or symptoms of bleeding, no new medications, no missed doses. Remain on same regimen. Dosage Out:  Su-2.31mM-0mgT-1.25mgW-1.25mgTh-2.5mgF-2.5mgSa-2.5mg  Using her green, 2.58mwarfarin tablets.

## 2020-05-22 ENCOUNTER — Ambulatory Visit: Payer: HMO | Admitting: Behavioral Health

## 2020-05-22 ENCOUNTER — Ambulatory Visit: Payer: HMO

## 2020-05-22 ENCOUNTER — Ambulatory Visit
Admission: RE | Admit: 2020-05-22 | Discharge: 2020-05-22 | Disposition: A | Discharge status: Home / Self Care | Payer: HMO | Source: Ambulatory Visit | Attending: Internal Medicine | Admitting: Internal Medicine

## 2020-05-22 DIAGNOSIS — J449 Chronic obstructive pulmonary disease, unspecified: Secondary | ICD-10-CM

## 2020-05-22 DIAGNOSIS — F17210 Nicotine dependence, cigarettes, uncomplicated: Secondary | ICD-10-CM | POA: Diagnosis not present

## 2020-05-22 DIAGNOSIS — Z720 Tobacco use: Secondary | ICD-10-CM

## 2020-05-22 NOTE — Progress Notes (Signed)
Attempted to obtain medical history via telephone, unable to reach at this time. I left a voicemail to return pre surgical testing department's phone call.

## 2020-05-23 ENCOUNTER — Telehealth: Payer: Self-pay | Admitting: Internal Medicine

## 2020-05-23 ENCOUNTER — Ambulatory Visit: Payer: HMO | Admitting: Behavioral Health

## 2020-05-23 ENCOUNTER — Other Ambulatory Visit: Payer: Self-pay

## 2020-05-23 DIAGNOSIS — F419 Anxiety disorder, unspecified: Secondary | ICD-10-CM

## 2020-05-23 DIAGNOSIS — F331 Major depressive disorder, recurrent, moderate: Secondary | ICD-10-CM

## 2020-05-23 DIAGNOSIS — N289 Disorder of kidney and ureter, unspecified: Secondary | ICD-10-CM

## 2020-05-23 IMAGING — DX DG HIP (WITH OR WITHOUT PELVIS) 2-3V*R*
3 series · 3 of 3 positions shown · non-contrast
Comparison: Prior radiographs of the pelvis 10/01/2017

CLINICAL DATA: 67-year-old female status post fall from standing on
to her right buttock now with generalized pain.

EXAM:
DG HIP (WITH OR WITHOUT PELVIS) 2-3V RIGHT

[pelvis ap]
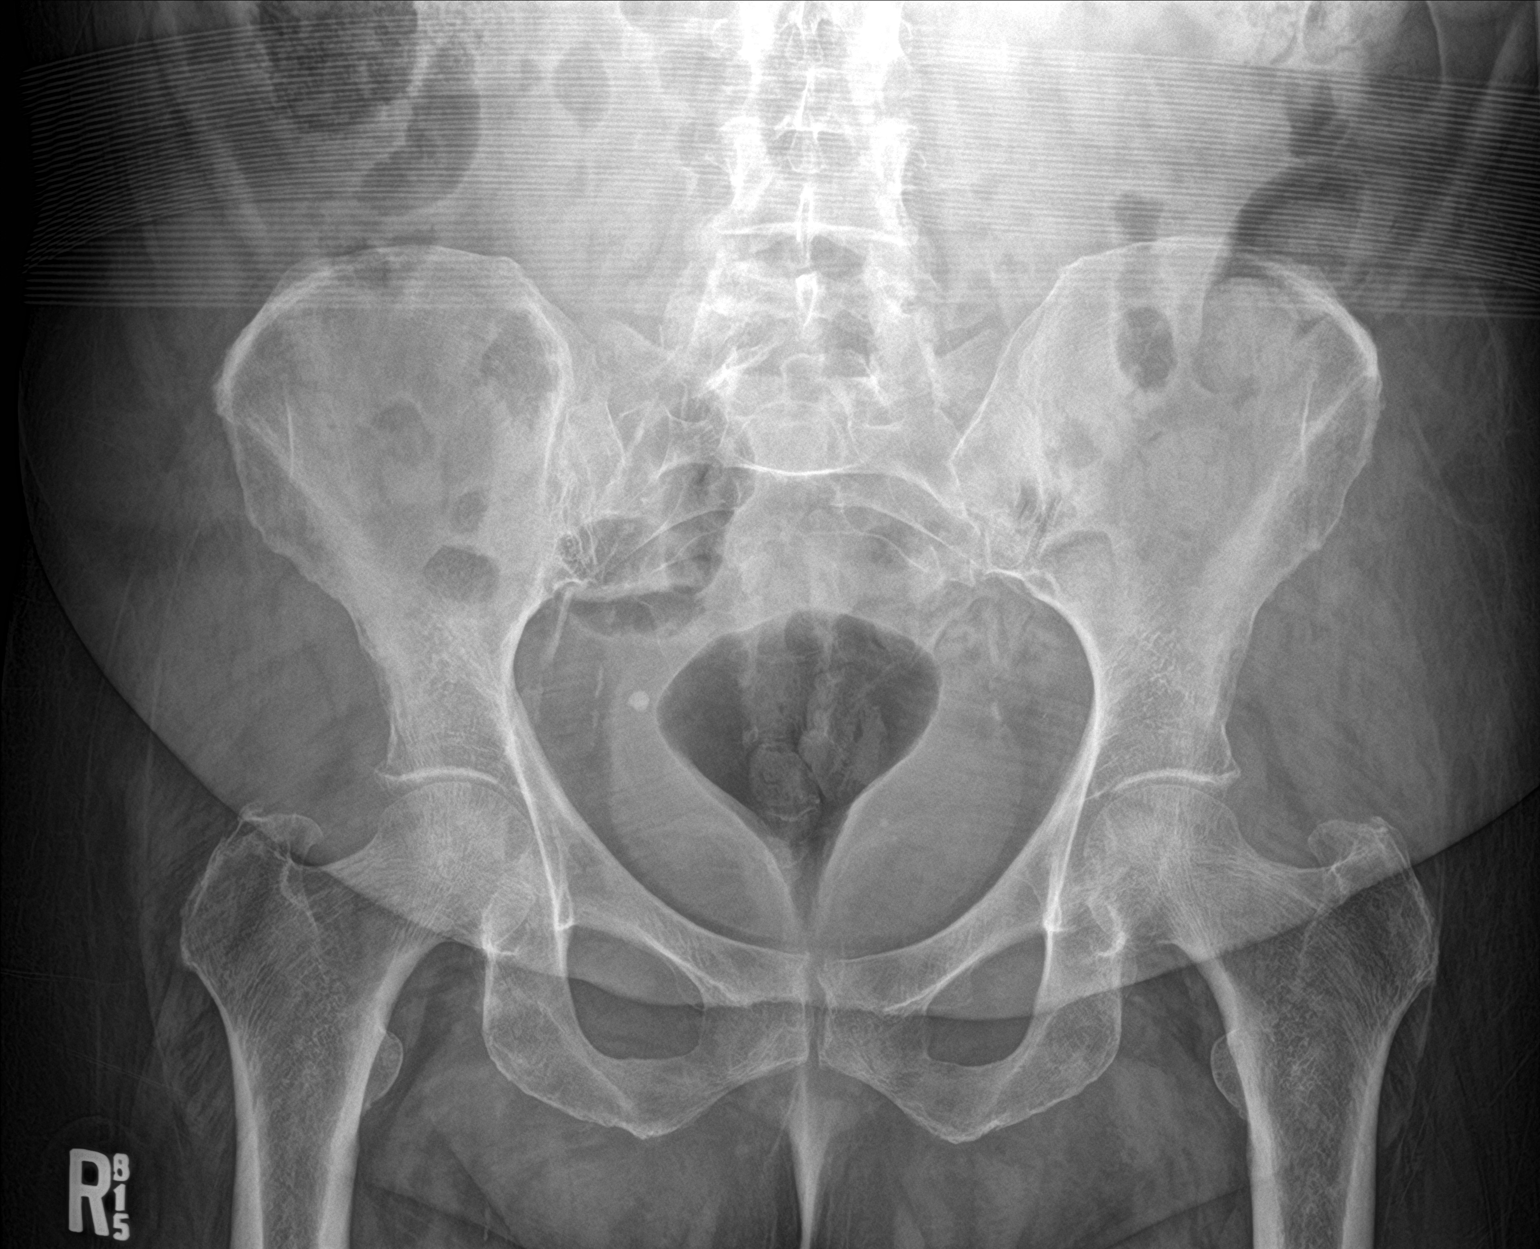

[hip ap]
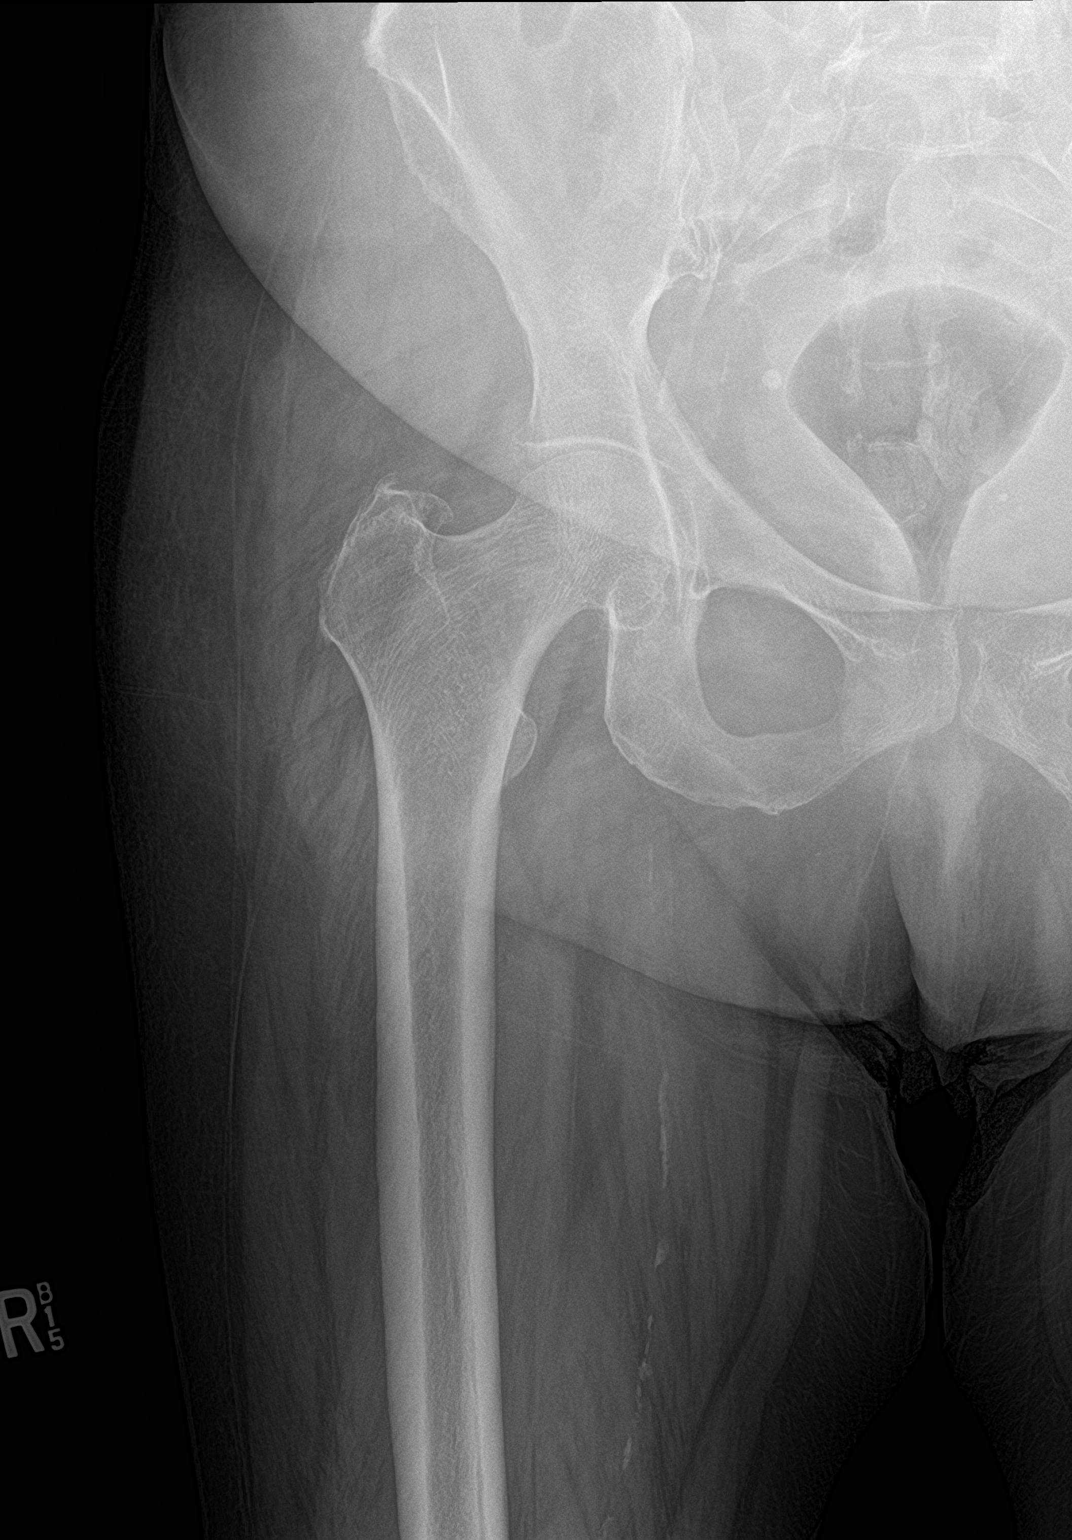

[hip lat]
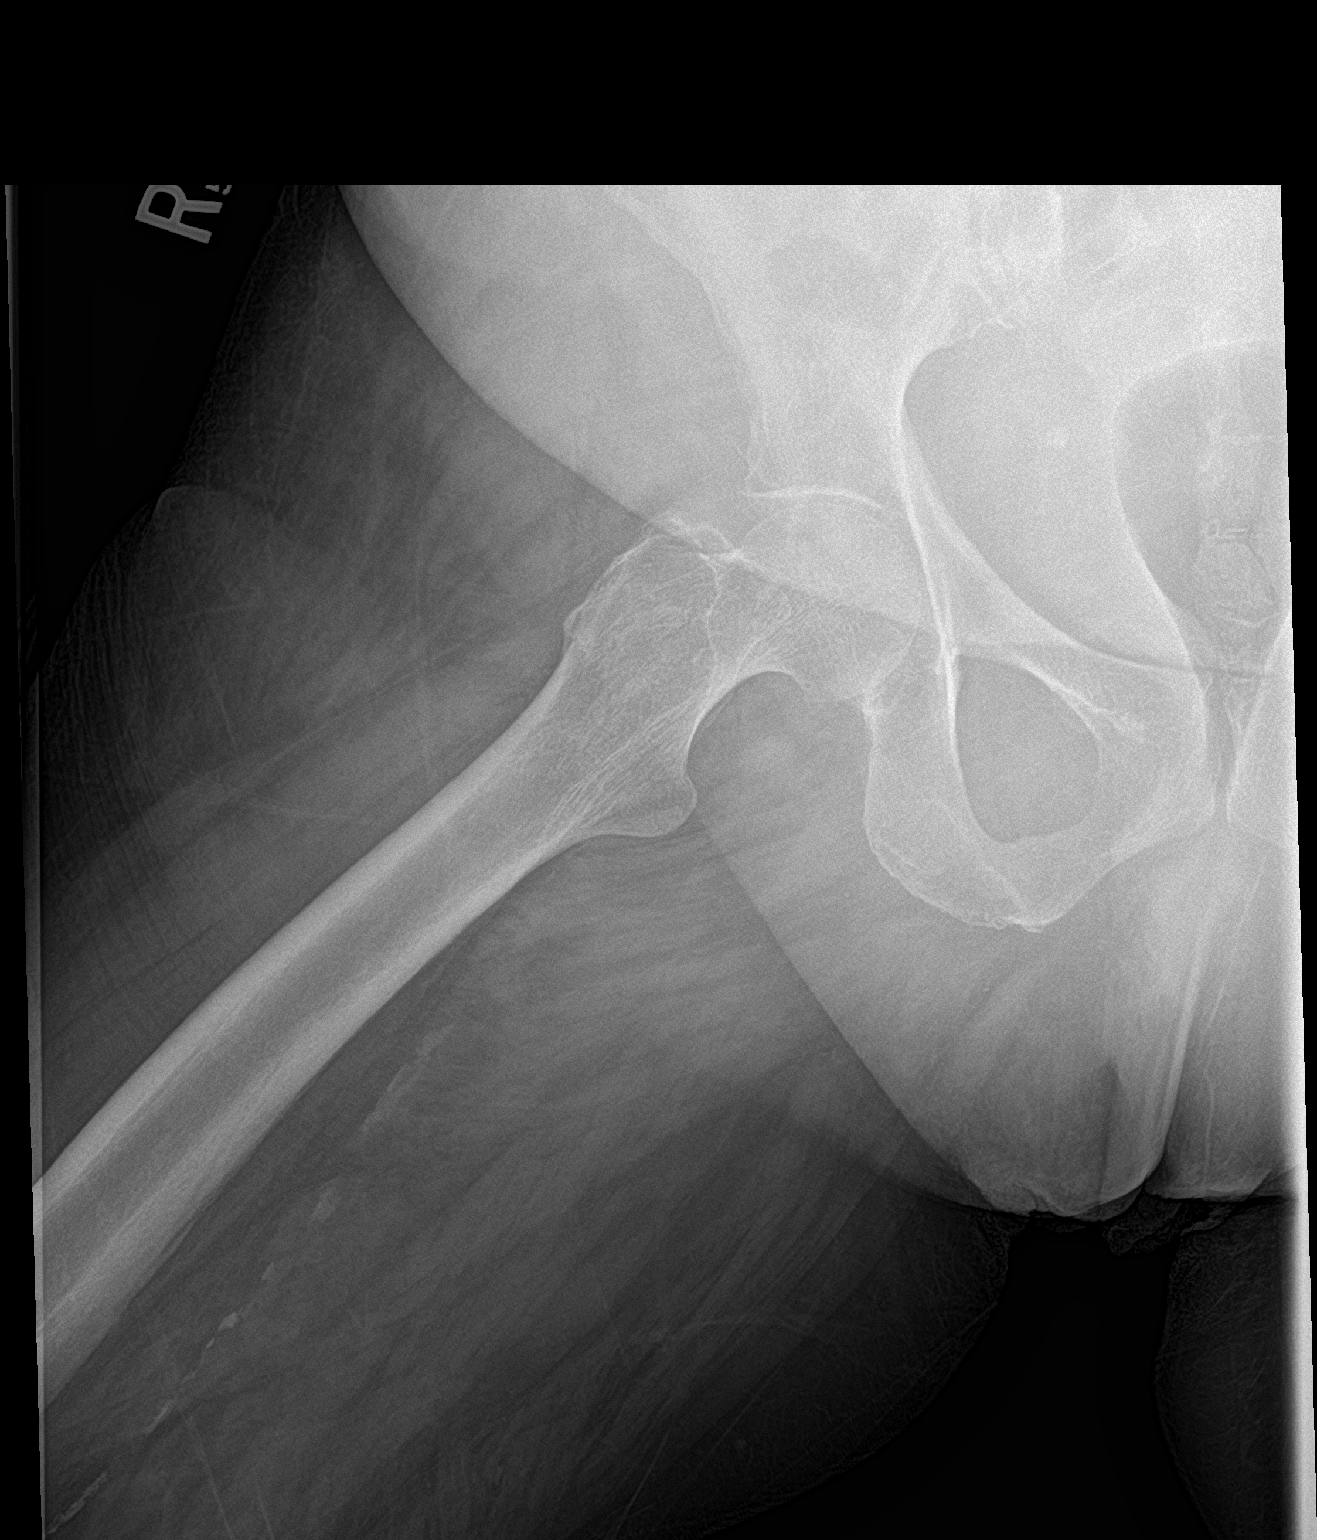

[3 of 3 positions shown; findings below may reference images not displayed]

FINDINGS: There is no evidence of hip fracture or dislocation. There is no
evidence of arthropathy or other focal bone abnormality.
Atherosclerotic vascular calcifications visualized in the iliac and
superficial femoral arteries.
IMPRESSION: 1. Negative.
2. Atherosclerotic vascular calcifications.

## 2020-05-23 NOTE — Telephone Encounter (Signed)
Deneise Lever, MD  P Lbpu Triage Pool CT chest- stable COPD and small lung nodule. There is an enlarging lesion on the right kidney that needs to bee looked at.   Order- renal ultrasound  dx enlarging lesion R kidney on CT chest  c  Called and spoke with pt and she is aware of [facility] recs.  Pt is aware that the order for the renal US has been placed and they will call to get this scheduled for her.    Pt stated that she had kidney cancer in her left kidney 10 years ago and they went in and froze it and she never had to have treatment.  They followed her for 6 years and she has not been seen for this since.  She wanted [facility] to be aware of this.

## 2020-05-23 NOTE — BH Specialist Note (Addendum)
Integrated Behavioral Health via Telemedicine Visit  05/23/2020 Maevyn Riordan [id number]  Number of [place] visits: 6/6 Session Start time: 9:35am  Session End time: 10:00am Total time: 35   Referring Provider: Dr. Dorian Pod, MD Patient/Family location: Pt in private @ home [facility] Provider location: [facility] All persons participating in visit: Pt & Clinician Types of Service: Individual psychotherapy  I connected with [name] and/or Evans Lance Deeb's self via  Telephone or Video Enabled Telemedicine Application  (Video is Caregility application) and verified that I am speaking with the correct person using two identifiers. Discussed confidentiality: Yes   I discussed the limitations of telemedicine and the availability of in person appointments.  Discussed there is a possibility of technology failure and discussed alternative modes of communication if that failure occurs.  I discussed that engaging in this telemedicine visit, they consent to the provision of behavioral healthcare and the services will be billed under their insurance.  Patient and/or legal guardian expressed understanding and consented to Telemedicine visit: Yes   Presenting Concerns: Patient and/or family reports the following symptoms/concerns: elevated anxiety due to concern for results of her CT scan for the lungs Duration of problem: a few days of concern; Severity of problem: moderate  Patient and/or Family's Strengths/Protective Factors: Social and Emotional competence and Concrete supports in place (healthy food, safe environments, etc.)  Goals Addressed: Patient will: 1.  Reduce symptoms of: anxiety and depression  2.  Increase knowledge and/or ability of: coping skills and pain mgmt  3.  Demonstrate ability to: Increase healthy adjustment to current life circumstances  Progress towards Goals: Ongoing  Interventions: Interventions utilized:   Solution-Focused Strategies and Supportive Counseling Standardized Assessments completed: Not Needed  Patient and/or Family Response: Pt receptive to call today & requests future appt  Assessment: Patient currently experiencing anxiety over upcoming procedures; Endoscopy/Colonoscopy on Tue, April 19th, 2022.  Patient may benefit from cont'd support for health status changes.  Plan: 1. Follow up with behavioral health clinician on : Nxt avail tele for 30 min ck-in 2. Behavioral recommendations: Try to fllw the instructions for your prep on Monday. See if Str can assist to support you. 3. Referral(s): [facility] (In Clinic)  4. Referral initiated for Sr[facility] TeleCare Reassurance Prog-ppw being sent to Clinician  I discussed the assessment and treatment plan with the patient and/or parent/guardian. They were provided an opportunity to ask questions and all were answered. They agreed with the plan and demonstrated an understanding of the instructions.   They were advised to call back or seek an in-person evaluation if the symptoms worsen or if the condition fails to improve as anticipated.  Donnetta Hutching, LMFT

## 2020-05-25 ENCOUNTER — Other Ambulatory Visit (HOSPITAL_COMMUNITY)
Admission: RE | Admit: 2020-05-25 | Discharge: 2020-05-25 | Disposition: A | Discharge status: Home / Self Care | Payer: HMO | Source: Ambulatory Visit | Attending: Gastroenterology | Admitting: Gastroenterology

## 2020-05-25 DIAGNOSIS — Z20822 Contact with and (suspected) exposure to covid-19: Secondary | ICD-10-CM | POA: Insufficient documentation

## 2020-05-25 DIAGNOSIS — Z01812 Encounter for preprocedural laboratory examination: Secondary | ICD-10-CM | POA: Diagnosis not present

## 2020-05-25 LAB — SARS CORONAVIRUS 2 (TAT 6-24 HRS): SARS Coronavirus 2: NEGATIVE

## 2020-05-28 ENCOUNTER — Telehealth: Payer: Self-pay | Admitting: Pharmacist

## 2020-05-28 ENCOUNTER — Telehealth: Payer: Self-pay | Admitting: Dietician

## 2020-05-28 NOTE — Telephone Encounter (Signed)
"  worried about her blood sugars with upcoming colonoscopy. 61 (no symptoms)-149 happened this weekend.  Today 142-86 (no symptoms)-14 carbs from apple juice- 112 went to sleep- 109.  Taking 45 Xultophy. Assured her the half dose she took today would make sure her blood sugars are at a good level.  We discussed her uploading her blood sugars and she has an appointment with her PCP next week.

## 2020-05-28 NOTE — Telephone Encounter (Signed)
Patient texted me results of her PST FS POC INR determination from today = 1.2 (target goal 1.5 - 2.5). Stopped warfarin on 16-APR-22 per instruction of her GI Medicine Physician who is performing procedures (colonoscopy and endoscopy) on 19-APR-22.

## 2020-05-29 ENCOUNTER — Ambulatory Visit (HOSPITAL_COMMUNITY): Payer: HMO | Admitting: Anesthesiology

## 2020-05-29 ENCOUNTER — Encounter (HOSPITAL_COMMUNITY)
Admission: RE | Disposition: A | Discharge status: Home / Self Care | Payer: Self-pay | Source: Home / Self Care | Attending: Gastroenterology

## 2020-05-29 ENCOUNTER — Ambulatory Visit (HOSPITAL_COMMUNITY)
Admission: RE | Admit: 2020-05-29 | Discharge: 2020-05-29 | Disposition: A | Discharge status: Home / Self Care | Payer: HMO | Attending: Gastroenterology | Admitting: Gastroenterology

## 2020-05-29 ENCOUNTER — Other Ambulatory Visit: Payer: Self-pay

## 2020-05-29 ENCOUNTER — Encounter (HOSPITAL_COMMUNITY): Payer: Self-pay | Admitting: Gastroenterology

## 2020-05-29 DIAGNOSIS — K2950 Unspecified chronic gastritis without bleeding: Secondary | ICD-10-CM | POA: Insufficient documentation

## 2020-05-29 DIAGNOSIS — I050 Rheumatic mitral stenosis: Secondary | ICD-10-CM | POA: Diagnosis not present

## 2020-05-29 DIAGNOSIS — I509 Heart failure, unspecified: Secondary | ICD-10-CM | POA: Diagnosis not present

## 2020-05-29 DIAGNOSIS — Z1211 Encounter for screening for malignant neoplasm of colon: Secondary | ICD-10-CM | POA: Diagnosis not present

## 2020-05-29 DIAGNOSIS — D123 Benign neoplasm of transverse colon: Secondary | ICD-10-CM | POA: Diagnosis not present

## 2020-05-29 DIAGNOSIS — Z7951 Long term (current) use of inhaled steroids: Secondary | ICD-10-CM | POA: Diagnosis not present

## 2020-05-29 DIAGNOSIS — K648 Other hemorrhoids: Secondary | ICD-10-CM | POA: Diagnosis not present

## 2020-05-29 DIAGNOSIS — D120 Benign neoplasm of cecum: Secondary | ICD-10-CM | POA: Diagnosis not present

## 2020-05-29 DIAGNOSIS — Q439 Congenital malformation of intestine, unspecified: Secondary | ICD-10-CM | POA: Diagnosis not present

## 2020-05-29 DIAGNOSIS — Z952 Presence of prosthetic heart valve: Secondary | ICD-10-CM | POA: Diagnosis not present

## 2020-05-29 DIAGNOSIS — D509 Iron deficiency anemia, unspecified: Secondary | ICD-10-CM | POA: Insufficient documentation

## 2020-05-29 DIAGNOSIS — Z86010 Personal history of colonic polyps: Secondary | ICD-10-CM | POA: Diagnosis not present

## 2020-05-29 DIAGNOSIS — D122 Benign neoplasm of ascending colon: Secondary | ICD-10-CM | POA: Diagnosis not present

## 2020-05-29 DIAGNOSIS — Z79899 Other long term (current) drug therapy: Secondary | ICD-10-CM | POA: Diagnosis not present

## 2020-05-29 DIAGNOSIS — Z794 Long term (current) use of insulin: Secondary | ICD-10-CM | POA: Diagnosis not present

## 2020-05-29 DIAGNOSIS — K449 Diaphragmatic hernia without obstruction or gangrene: Secondary | ICD-10-CM | POA: Diagnosis not present

## 2020-05-29 DIAGNOSIS — Z7901 Long term (current) use of anticoagulants: Secondary | ICD-10-CM | POA: Diagnosis not present

## 2020-05-29 DIAGNOSIS — K635 Polyp of colon: Secondary | ICD-10-CM | POA: Diagnosis not present

## 2020-05-29 DIAGNOSIS — J449 Chronic obstructive pulmonary disease, unspecified: Secondary | ICD-10-CM | POA: Diagnosis not present

## 2020-05-29 DIAGNOSIS — F17210 Nicotine dependence, cigarettes, uncomplicated: Secondary | ICD-10-CM | POA: Diagnosis not present

## 2020-05-29 DIAGNOSIS — D126 Benign neoplasm of colon, unspecified: Secondary | ICD-10-CM | POA: Diagnosis not present

## 2020-05-29 DIAGNOSIS — D124 Benign neoplasm of descending colon: Secondary | ICD-10-CM | POA: Diagnosis not present

## 2020-05-29 HISTORY — PX: ESOPHAGOGASTRODUODENOSCOPY (EGD) WITH PROPOFOL: SHX5813

## 2020-05-29 HISTORY — PX: POLYPECTOMY: SHX5525

## 2020-05-29 HISTORY — PX: COLONOSCOPY WITH PROPOFOL: SHX5780

## 2020-05-29 LAB — GLUCOSE, CAPILLARY: Glucose-Capillary: 79 mg/dL (ref 70–99)

## 2020-05-29 SURGERY — COLONOSCOPY WITH PROPOFOL
Anesthesia: Monitor Anesthesia Care

## 2020-05-29 MED ORDER — LACTATED RINGERS IV SOLN: INTRAVENOUS | Status: DC

## 2020-05-29 MED ORDER — PROPOFOL 10 MG/ML IV BOLUS
INTRAVENOUS | Status: DC | PRN
  Administered 2020-05-29 (×2): 30 mg via INTRAVENOUS

## 2020-05-29 MED ORDER — PROPOFOL 500 MG/50ML IV EMUL
INTRAVENOUS | Status: AC
  Filled 2020-05-29: qty 50

## 2020-05-29 MED ORDER — PROPOFOL 500 MG/50ML IV EMUL
INTRAVENOUS | Status: DC | PRN
  Administered 2020-05-29: 125 ug/kg/min via INTRAVENOUS

## 2020-05-29 MED ORDER — LIDOCAINE 2% (20 MG/ML) 5 ML SYRINGE
INTRAMUSCULAR | Status: DC | PRN
  Administered 2020-05-29: 80 mg via INTRAVENOUS

## 2020-05-29 SURGICAL SUPPLY — 25 items

## 2020-05-29 NOTE — Transfer of Care (Signed)
Immediate Anesthesia Transfer of Care Note  Patient: [name]  Procedure(s) Performed: COLONOSCOPY WITH PROPOFOL (N/A ) ESOPHAGOGASTRODUODENOSCOPY (EGD) WITH PROPOFOL (N/A ) POLYPECTOMY  Patient Location: PACU  Anesthesia Type:MAC  Level of Consciousness: awake, alert  and oriented  Airway & Oxygen Therapy: Patient Spontanous Breathing and Patient connected to face mask oxygen  Post-op Assessment: Report given to RN and Post -op Vital signs reviewed and stable  Post vital signs: Reviewed and stable  Last Vitals:  Vitals Value Taken Time  BP 128/25 05/29/20 1007  Temp    Pulse 77 05/29/20 1009  Resp 16 05/29/20 1009  SpO2 100 % 05/29/20 1009  Vitals shown include unvalidated device data.  Last Pain:  Vitals:   05/29/20 0759  TempSrc: Oral  PainSc: 0-No pain         Complications: No complications documented.

## 2020-05-29 NOTE — Anesthesia Postprocedure Evaluation (Signed)
Anesthesia Post Note  Patient: [name]  Procedure(s) Performed: COLONOSCOPY WITH PROPOFOL (N/A ) ESOPHAGOGASTRODUODENOSCOPY (EGD) WITH PROPOFOL (N/A ) POLYPECTOMY     Patient location during evaluation: Endoscopy Anesthesia Type: MAC Level of consciousness: awake and alert Pain management: pain level controlled Vital Signs Assessment: post-procedure vital signs reviewed and stable Respiratory status: spontaneous breathing, nonlabored ventilation and respiratory function stable Cardiovascular status: blood pressure returned to baseline and stable Postop Assessment: no apparent nausea or vomiting Anesthetic complications: no   No complications documented.  Last Vitals:  Vitals:   05/29/20 1020 05/29/20 1030  BP: (!) 126/31 (!) 136/37  Pulse: 77 78  Resp: 14 14  Temp:    SpO2: 98% 97%    Last Pain:  Vitals:   05/29/20 1030  TempSrc:   PainSc: 0-No pain                 Lidia Collum

## 2020-05-29 NOTE — Discharge Instructions (Signed)
Call if question or problem otherwise if doing well and no signs of bleeding resume Coumadin on Thursday and begin with soft solids first diet today and slowly advance diet and follow-up in the office as needed and call for biopsy report in 1 week but probably repeat colonoscopy in 1 year based on the number of polyps  YOU HAD AN ENDOSCOPIC PROCEDURE TODAY: Refer to the procedure report and other information in the discharge instructions given to you for any specific questions about what was found during the examination. If this information does not answer your questions, please call [facility] office at [phone] to clarify.   YOU SHOULD EXPECT: Some feelings of bloating in the abdomen. Passage of more gas than usual. Walking can help get rid of the air that was put into your GI tract during the procedure and reduce the bloating. If you had a lower endoscopy (such as a colonoscopy or flexible sigmoidoscopy) you may notice spotting of blood in your stool or on the toilet paper. Some abdominal soreness may be present for a day or two, also.  DIET: Your first meal following the procedure should be a light meal and then it is ok to progress to your normal diet. A half-sandwich or bowl of soup is an example of a good first meal. Heavy or fried foods are harder to digest and may make you feel nauseous or bloated. Drink plenty of fluids but you should avoid alcoholic beverages for 24 hours. If you had a esophageal dilation, please see attached instructions for diet.    ACTIVITY: Your care partner should take you home directly after the procedure. You should plan to take it easy, moving slowly for the rest of the day. You can resume normal activity the day after the procedure however YOU SHOULD NOT DRIVE, use power tools, machinery or perform tasks that involve climbing or major physical exertion for 24 hours (because of the sedation medicines used during the test).   SYMPTOMS TO REPORT IMMEDIATELY: A  gastroenterologist can be reached at any hour. Please call [phone]  for any of the following symptoms:  . Following lower endoscopy (colonoscopy, flexible sigmoidoscopy) Excessive amounts of blood in the stool  Significant tenderness, worsening of abdominal pains  Swelling of the abdomen that is new, acute  Fever of 100 or higher  . Following upper endoscopy (EGD, EUS, ERCP, esophageal dilation) Vomiting of blood or coffee ground material  New, significant abdominal pain  New, significant chest pain or pain under the shoulder blades  Painful or persistently difficult swallowing  New shortness of breath  Black, tarry-looking or red, bloody stools  FOLLOW UP:  If any biopsies were taken you will be contacted by phone or by letter within the next 1-3 weeks. Call [phone]  if you have not heard about the biopsies in 3 weeks.  Please also call with any specific questions about appointments or follow up tests.

## 2020-05-29 NOTE — Op Note (Signed)
[facility] Patient Name: [name] Procedure Date: 05/29/2020 MRN: [id number] Attending MD: Clarene Essex , MD Date of Birth: 26-Mar-1950 CSN: [id number] Age: 70 Admit Type: Outpatient Procedure:                Upper GI endoscopy Indications:              Iron deficiency anemia Providers:                Clarene Essex, MD, Josie Dixon, RN, Elspeth Cho                            Tech., Technician, [facility], CRNA Referring MD:              Medicines:                Propofol total dose 110 mg IV, 80 mg IV lidocaine Complications:            No immediate complications. Estimated Blood Loss:     Estimated blood loss: none. Procedure:                Pre-Anesthesia Assessment:                           - Prior to the procedure, a History and Physical                            was performed, and patient medications and                            allergies were reviewed. The patient's tolerance of                            previous anesthesia was also reviewed. The risks                            and benefits of the procedure and the sedation                            options and risks were discussed with the patient.                            All questions were answered, and informed consent                            was obtained. Prior Anticoagulants: The patient has                            taken Coumadin (warfarin), last dose was 4 days                            prior to procedure. ASA Grade Assessment: III - A                            patient with severe systemic disease. After  reviewing the risks and benefits, the patient was                            deemed in satisfactory condition to undergo the                            procedure.                           After obtaining informed consent, the endoscope was                            passed under direct vision. Throughout the                             procedure, the patient's blood pressure, pulse, and                            oxygen saturations were monitored continuously. The                            GIF-H190 ([id number] Olympus gastroscope was                            introduced through the mouth, and advanced to the                            third part of duodenum. The patient tolerated the                            procedure well. The upper GI endoscopy was                            accomplished without difficulty. Scope In: Scope Out: Findings:      A Questionable tiny hiatal hernia was present.      Diffuse mild inflammation characterized by congestion (edema) and       granularity was found in the cardia, in the gastric fundus and in the       gastric body.      The duodenal bulb, first portion of the duodenum, second portion of the       duodenum and third portion of the duodenum were normal.      The exam was otherwise without abnormality. Impression:               - Questionable tiny hiatal hernia.                           - Chronic gastritis.                           - Normal duodenal bulb, first portion of the                            duodenum, second portion of the duodenum and third  portion of the duodenum.                           - The examination was otherwise normal.                           - No specimens collected. Moderate Sedation:      Not Applicable - Patient had care per Anesthesia. Recommendation:           - Patient has a contact number available for                            emergencies. The signs and symptoms of potential                            delayed complications were discussed with the                            patient. Return to normal activities tomorrow.                            Written discharge instructions were provided to the                            patient.                           - Soft diet today.                           - Continue  present medications.                           - Resume Coumadin (warfarin) at prior dose in 2                            days.                           - Perform a colonoscopy today. Procedure Code(s):        --- Professional ---                           [phone], Esophagogastroduodenoscopy, flexible,                            transoral; diagnostic, including collection of                            specimen(s) by brushing or washing, when performed                            (separate procedure) Diagnosis Code(s):        --- Professional ---                           K44.9, Diaphragmatic hernia without obstruction or  gangrene                           K29.50, Unspecified chronic gastritis without                            bleeding                           D50.9, Iron deficiency anemia, unspecified CPT copyright 2019 [employer]. All rights reserved. The codes documented in this report are preliminary and upon coder review may  be revised to meet current compliance requirements. Clarene Essex, MD 05/29/2020 10:13:11 AM This report has been signed electronically. Number of Addenda: 0

## 2020-05-29 NOTE — Telephone Encounter (Signed)
Patient undergoing endoscopy and colonoscopy. Has held her warfarin based upon instructions provided by GI Medicine. Will resume s/p procedure(s).

## 2020-05-29 NOTE — Op Note (Signed)
[facility] Patient Name: [name] Procedure Date: 05/29/2020 MRN: [id number] Attending MD: Clarene Essex , MD Date of Birth: [birth year] CSN: [id number] Age: 70 Admit Type: Outpatient Procedure:                Colonoscopy Indications:              High risk colon cancer surveillance: Personal                            history of colonic polyps, Last colonoscopy: 2013,                            Incidental - Iron deficiency anemia Providers:                Clarene Essex, MD, Josie Dixon, RN, Elspeth Cho                            Tech., Technician, [facility], CRNA Referring MD:              Medicines:                Propofol total dose 719 mg IV Complications:            No immediate complications. Estimated Blood Loss:     Estimated blood loss was minimal. Procedure:                Pre-Anesthesia Assessment:                           - Prior to the procedure, a History and Physical                            was performed, and patient medications and                            allergies were reviewed. The patient's tolerance of                            previous anesthesia was also reviewed. The risks                            and benefits of the procedure and the sedation                            options and risks were discussed with the patient.                            All questions were answered, and informed consent                            was obtained. Prior Anticoagulants: The patient has                            taken Coumadin (warfarin), last dose was 4 days  prior to procedure. ASA Grade Assessment: III - A                            patient with severe systemic disease. After                            reviewing the risks and benefits, the patient was                            deemed in satisfactory condition to undergo the                            procedure.                           - Prior to the  procedure, a History and Physical                            was performed, and patient medications and                            allergies were reviewed. The patient's tolerance of                            previous anesthesia was also reviewed. The risks                            and benefits of the procedure and the sedation                            options and risks were discussed with the patient.                            All questions were answered, and informed consent                            was obtained. Prior Anticoagulants: The patient has                            taken Coumadin (warfarin), last dose was 4 days                            prior to procedure. ASA Grade Assessment: III - A                            patient with severe systemic disease. After                            reviewing the risks and benefits, the patient was                            deemed in satisfactory condition to undergo the  procedure.                           After obtaining informed consent, the colonoscope                            was passed under direct vision. Throughout the                            procedure, the patient's blood pressure, pulse, and                            oxygen saturations were monitored continuously. The                            PCF-[id number] ([id number] Olympus pediatric colonscope                            was introduced through the anus and advanced to the                            the cecum, identified by appendiceal orifice and                            ileocecal valve. The ileocecal valve, appendiceal                            orifice, and rectum were photographed. The                            colonoscopy was somewhat difficult due to abnormal                            anatomy. Successful completion of the procedure was                            aided by performing the maneuvers documented                             (below) in this report. The patient tolerated the                            procedure well. The quality of the bowel                            preparation was adequate. abdominal pressure was                            applied Scope In: 9:19:01 AM Scope Out: 9:59:01 AM Scope Withdrawal Time: 0 hours 31 minutes 2 seconds  Total Procedure Duration: 0 hours 40 minutes 0 seconds  Findings:      Internal hemorrhoids were found during retroflexion, during perianal       exam and during digital exam. The hemorrhoids were small.      Multiple semi-sessile polyps were  found in the descending colon-1,       transverse colon and hepatic flexure-9, ascending colon-7 and cecum-1.       The polyps were small in size. These polyps were removed with a cold       snare. Resection and retrieval were complete.      A medium polyp was found in the mid transverse colon. The polyp was       semi-sessile. The polyp was removed with a hot snare. Resection and       retrieval were complete.      The exam was otherwise without abnormality. Impression:               - Internal hemorrhoids.                           - Multiple small polyps in the descending colon, in                            the transverse colon, at the hepatic flexure, in                            the ascending colon and in the cecum, removed with                            a cold snare. Resected and retrieved.                           - One medium polyp in the mid transverse colon,                            removed with a hot snare. Resected and retrieved.                           - The examination was otherwise normal. Moderate Sedation:      Not Applicable - Patient had care per Anesthesia. Recommendation:           - Patient has a contact number available for                            emergencies. The signs and symptoms of potential                            delayed complications were discussed with the                             patient. Return to normal activities tomorrow.                            Written discharge instructions were provided to the                            patient.                           - Soft diet today.                           -  Resume Coumadin (warfarin) at prior dose in 2                            days.                           - Await pathology results.                           - Repeat colonoscopy in 1 year for surveillance                            based on pathology results.                           - Return to GI office PRN.                           - Telephone GI clinic for pathology results in 1                            week.                           - Telephone GI clinic if symptomatic PRN. Procedure Code(s):        --- Professional ---                           [phone], Colonoscopy, flexible; with removal of                            tumor(s), polyp(s), or other lesion(s) by snare                            technique Diagnosis Code(s):        --- Professional ---                           K63.5, Polyp of colon                           Z86.010, Personal history of colonic polyps CPT copyright 2019 [employer]. All rights reserved. The codes documented in this report are preliminary and upon coder review may  be revised to meet current compliance requirements. Clarene Essex, MD 05/29/2020 10:20:47 AM This report has been signed electronically. Number of Addenda: 0

## 2020-05-29 NOTE — Anesthesia Preprocedure Evaluation (Signed)
Anesthesia Evaluation  Patient identified by MRN, date of birth, ID band Patient awake    Reviewed: Allergy & Precautions, NPO status , Patient's Chart, lab work & pertinent test results  History of Anesthesia Complications Negative for: history of anesthetic complications  Airway Mallampati: II  TM Distance: >3 FB Neck ROM: Full    Dental   Pulmonary sleep apnea and Oxygen sleep apnea , COPD, Current Smoker,  Pulmonary HTN   Pulmonary exam normal        Cardiovascular hypertension, + Peripheral Vascular Disease and +CHF  Normal cardiovascular exam+ dysrhythmias Atrial Fibrillation + Valvular Problems/Murmurs (s/p MVR 2012)      Neuro/Psych Depression Carotid stenosis s/p endarterectomy    GI/Hepatic Neg liver ROS, hiatal hernia, GERD  ,  Endo/Other  diabetes, Type 2, Insulin Dependent  Renal/GU CRFRenal disease (CKD)  negative genitourinary   Musculoskeletal  (+) Fibromyalgia -  Abdominal   Peds  Hematology  (+) anemia ,   Anesthesia Other Findings  Echo 01/17/20: EF 65-70%, mild LVH, normal RV function, mild pulm HTN (RVSP 35), mod LAD, mild RAD. The mitral valve has been repaired/replaced. Mild mitral valve regurgitation. No evidence of mitral stenosis. The mean mitral valve gradient is 4.0 mmHg with average heart rate of 62 bpm. There is a 27 mm porcine present in the mitral position. Mild-mod TR.   Reproductive/Obstetrics                             Anesthesia Physical Anesthesia Plan  ASA: IV  Anesthesia Plan: MAC   Post-op Pain Management:    Induction: Intravenous  PONV Risk Score and Plan: 2 and Propofol infusion, TIVA and Treatment may vary due to age or medical condition  Airway Management Planned: Natural Airway, Nasal Cannula and Simple Face Mask  Additional Equipment: None  Intra-op Plan:   Post-operative Plan:   Informed Consent: I have reviewed the patients  History and Physical, chart, labs and discussed the procedure including the risks, benefits and alternatives for the proposed anesthesia with the patient or authorized representative who has indicated his/her understanding and acceptance.       Plan Discussed with:   Anesthesia Plan Comments:         Anesthesia Quick Evaluation

## 2020-05-29 NOTE — Progress Notes (Signed)
[name] 9:00 AM  Subjective: Patient without any GI complaints and has not seen any blood and is on Coumadin and her history was reviewed and her previous work-up reviewed including recent CT scan and she does have a history of polyps family history is negative and her case discussed with our PA in the office  Objective: Vital signs stable afebrile exam please see preassessment evaluation recent CT worrisome for kidney mass no other significant abnormalities iron deficiency anemia chemistries okay  Assessment: Iron deficiency in patient with multiple medical problems on Coumadin  Plan: Okay to proceed with colonoscopy and endoscopy with anesthesia assistance  [facility]  office [phone] After 5PM or if no answer call [phone]

## 2020-05-30 LAB — SURGICAL PATHOLOGY

## 2020-05-31 ENCOUNTER — Encounter (HOSPITAL_COMMUNITY): Payer: Self-pay | Admitting: Gastroenterology

## 2020-05-31 ENCOUNTER — Ambulatory Visit (HOSPITAL_COMMUNITY)
Admission: RE | Admit: 2020-05-31 | Discharge: 2020-05-31 | Disposition: A | Discharge status: Home / Self Care | Payer: HMO | Source: Ambulatory Visit | Attending: Internal Medicine | Admitting: Internal Medicine

## 2020-05-31 ENCOUNTER — Ambulatory Visit (INDEPENDENT_AMBULATORY_CARE_PROVIDER_SITE_OTHER): Payer: HMO | Admitting: Internal Medicine

## 2020-05-31 ENCOUNTER — Other Ambulatory Visit: Payer: Self-pay

## 2020-05-31 VITALS — BP 142/47 | HR 58 | Temp 98.0°F | Ht 62.0 in | Wt 167.5 lb

## 2020-05-31 DIAGNOSIS — B351 Tinea unguium: Secondary | ICD-10-CM

## 2020-05-31 DIAGNOSIS — E7841 Elevated Lipoprotein(a): Secondary | ICD-10-CM

## 2020-05-31 DIAGNOSIS — E1140 Type 2 diabetes mellitus with diabetic neuropathy, unspecified: Secondary | ICD-10-CM | POA: Diagnosis not present

## 2020-05-31 DIAGNOSIS — M25561 Pain in right knee: Secondary | ICD-10-CM | POA: Diagnosis not present

## 2020-05-31 DIAGNOSIS — I480 Paroxysmal atrial fibrillation: Secondary | ICD-10-CM

## 2020-05-31 DIAGNOSIS — M25461 Effusion, right knee: Secondary | ICD-10-CM | POA: Insufficient documentation

## 2020-05-31 DIAGNOSIS — Z953 Presence of xenogenic heart valve: Secondary | ICD-10-CM

## 2020-05-31 DIAGNOSIS — M79674 Pain in right toe(s): Secondary | ICD-10-CM

## 2020-05-31 DIAGNOSIS — L309 Dermatitis, unspecified: Secondary | ICD-10-CM | POA: Diagnosis not present

## 2020-05-31 DIAGNOSIS — G8929 Other chronic pain: Secondary | ICD-10-CM

## 2020-05-31 DIAGNOSIS — M5442 Lumbago with sciatica, left side: Secondary | ICD-10-CM

## 2020-05-31 DIAGNOSIS — N289 Disorder of kidney and ureter, unspecified: Secondary | ICD-10-CM

## 2020-05-31 DIAGNOSIS — Z952 Presence of prosthetic heart valve: Secondary | ICD-10-CM | POA: Diagnosis not present

## 2020-05-31 DIAGNOSIS — K219 Gastro-esophageal reflux disease without esophagitis: Secondary | ICD-10-CM

## 2020-05-31 DIAGNOSIS — Z7901 Long term (current) use of anticoagulants: Secondary | ICD-10-CM

## 2020-05-31 DIAGNOSIS — M79675 Pain in left toe(s): Secondary | ICD-10-CM

## 2020-05-31 DIAGNOSIS — I6523 Occlusion and stenosis of bilateral carotid arteries: Secondary | ICD-10-CM | POA: Diagnosis not present

## 2020-05-31 DIAGNOSIS — R918 Other nonspecific abnormal finding of lung field: Secondary | ICD-10-CM

## 2020-05-31 DIAGNOSIS — Z0000 Encounter for general adult medical examination without abnormal findings: Secondary | ICD-10-CM

## 2020-05-31 DIAGNOSIS — N904 Leukoplakia of vulva: Secondary | ICD-10-CM

## 2020-05-31 DIAGNOSIS — F321 Major depressive disorder, single episode, moderate: Secondary | ICD-10-CM

## 2020-05-31 DIAGNOSIS — M5441 Lumbago with sciatica, right side: Secondary | ICD-10-CM | POA: Diagnosis not present

## 2020-05-31 DIAGNOSIS — E785 Hyperlipidemia, unspecified: Secondary | ICD-10-CM

## 2020-05-31 DIAGNOSIS — Z794 Long term (current) use of insulin: Secondary | ICD-10-CM

## 2020-05-31 DIAGNOSIS — D509 Iron deficiency anemia, unspecified: Secondary | ICD-10-CM

## 2020-05-31 MED ORDER — HYDROCODONE-ACETAMINOPHEN 5-325 MG PO TABS: 2.0000 | ORAL_TABLET | Freq: Three times a day (TID) | ORAL | 0 refills | Status: DC | PRN

## 2020-05-31 MED ORDER — BETAMETHASONE DIPROPIONATE 0.05 % EX CREA: TOPICAL_CREAM | Freq: Two times a day (BID) | CUTANEOUS | 3 refills | Status: DC

## 2020-05-31 NOTE — Progress Notes (Signed)
[name] is herer for routine close f/u.    Since last visit on 04/12/20, she this week had her long-awaited EGD and colonoscopy 05/29/20 (Dr. Watt Climes)  to determine source of Fe deficiency anemia.  Multiple polyps found, benign. No obvious bleeding source. Procedure tolerated well.  Reports that she has a renal US upcoming to further evaluate a R kidney abnormality found on a screening lung CT (see the following):  "04/21/20 - screening chest CT -   Lung-RADS 2, benign appearance or behavior. Continue annual screening with low-dose chest CT without contrast in 12 months. 3.0 cm low-attenuation lesion off the upper pole right kidney is difficult to definitively characterize without post-contrast imaging and has enlarged from 10 mm on 08/23/2015. Consider further evaluation with ultrasound, as clinically indicated."  05/10/20 - Carotid US ordered by Dr. Martinique?  05/03/20 - COPD Dr. Annamaria Boots "continue current meds, return to using 02 2L for sleep, reschedule screening lung CT. " HOme study on room air planned? 04/17/20 - heart failure clinic - dr. Aundra Dubin plans f/u in 4 mo.  No change in management.  Fell on to R knee 3 wks ago.  Continues to have pain.  Able to bear weight but it is not comfortable.  She applied ice packs.  Has noticed there has been gradual swelling of the right ankle and foot particularly when dependent.  L elbow olecranon skin thickening, redness, cracking, emollient not helping.  Very chronic.   Pain - everywhere.  Taking 1.5 of the 7.5 bid to tid needs RF.  Wants to go back to the HC5/APAP 325 2 tid prn.   Breathing comfortably (at baseline).   Patient Active Problem List   Diagnosis Date Noted  . Abnormality of lung on CXR 02/02/2020  . Nocturnal hypoxia per sleep study 07/2019   . Chronic iron deficiency anemia   . Carotid artery stenosis; s/p R endarterectomy   . COPD mixed type ([place])   . Arthritis of both hands 07/01/2019  . Pain due to onychomycosis of toenails of both feet  05/27/2019  . Benign hypertensive heart and CKD, stage 3 (GFR 30-59), w CHF ([place]) 12/16/2018  . Lichen sclerosus of female genitalia 01/12/2017  . Mixed stress and urge urinary incontinence 05/27/2016  . Pulmonary hypertension due to chronic obstructive pulmonary disease ([place]) 04/25/2016  . Asymptomatic cholelithiasis 09/25/2015  . Aortic atherosclerosis ([place]) 10/19/2014  . Right nephrolithiasis, asymptomatic (incidental finding) 09/06/2014  . Chronic low back pain 10/06/2012  . Long term current use of anticoagulant therapy 09/30/2012  . Chronic venous insufficiency 08/04/2012  . Candidal intertrigo 07/28/2012  . Tobacco abuse 07/28/2012  . Obesity (BMI 30-39.9) 10/23/2011  . Personal history of colonic polyps 05/14/2011  . Sleep disturbance 05/14/2011  . Abdominal wall hernia 05/14/2011  . Health care maintenance 11/04/2010  . PAF (paroxysmal atrial fibrillation) ([place]) 10/22/2010  . Chronic congestive heart failure with left ventricular diastolic dysfunction ([place]) 10/21/2010  . Fibromyalgia 08/29/2010  . Gastroesophageal reflux disease   . Type 2 diabetes mellitus with diabetic neuropathy ([place])   . Osteoporosis   . PAD (peripheral artery disease) ([place]) 05/27/2010  . History of mitral valve replacement with bioprosthetic valve 2012  . Essential hypertension 06/19/2008  . Hyperlipidemia 11/20/2005  . Moderate major depression ([place]) 11/19/2005    Current Outpatient Medications:  .  betamethasone dipropionate 0.05 % cream, Apply topically 2 (two) times daily., Disp: 30 g, Rfl: 3 .  HYDROcodone-acetaminophen (NORCO/VICODIN) 5-325 MG tablet, Take 2 tablets by mouth 3 (three)  times daily as needed for moderate pain or severe pain., Disp: 120 tablet, Rfl: 0 .  albuterol (PROAIR HFA) 108 (90 Base) MCG/ACT inhaler, Inhale 2 puffs into the lungs every 6 (six) hours as needed for shortness of breath., Disp: 18 g, Rfl: 5 .  ALPRAZolam (XANAX) 1 MG tablet, TAKE 1 TABLET BY MOUTH EVERY NIGHT AT  BEDTIME AS NEEDED FOR INSOMNIA. MAY TAKE 1/2 TABLET BY MOUTH DURING THE DAY FOR ANXIETY, Disp: 55 tablet, Rfl: 4 .  benzonatate (TESSALON) 100 MG capsule, Take 1 capsule (100 mg total) by mouth 3 (three) times daily as needed for cough., Disp: 90 capsule, Rfl: 11 .  Blood Glucose Monitoring Suppl (ONETOUCH VERIO) W/DEVICE KIT, 1 each by Does not apply route 4 (four) times daily., Disp: 1 kit, Rfl: 0 .  buPROPion (WELLBUTRIN XL) 150 MG 24 hr tablet, Take 1 tablet (150 mg total) by mouth every morning for 365 doses., Disp: 90 tablet, Rfl: 3 .  chlorpheniramine (CHLOR-TRIMETON) 4 MG tablet, Take 4 mg by mouth daily as needed for allergies., Disp: , Rfl:  .  Continuous Blood Gluc Sensor (FREESTYLE LIBRE 2 SENSOR) MISC, 1 each by Does not apply route 6 (six) times daily. To read 6 times daily with change of sensor every 2 weeks, 90 days worth with 3 refills, Disp: 7 each, Rfl: 3 .  DULoxetine (CYMBALTA) 60 MG capsule, Take 1 capsule (60 mg total) by mouth 2 (two) times daily., Disp: 60 capsule, Rfl: 3 .  fluticasone (FLONASE) 50 MCG/ACT nasal spray, Place 2 sprays into both nostrils daily., Disp: 54 g, Rfl: 3 .  Fluticasone-Umeclidin-Vilant (TRELEGY ELLIPTA) 100-62.5-25 MCG/INH AEPB, Inhale 1 puff into the lungs daily., Disp: 60 each, Rfl: 6 .  furosemide (LASIX) 80 MG tablet, TAKE 1 TABLET BY MOUTH EVERY MORNING AND TAKE 1/2 TABLET EVERY EVENING., Disp: 45 tablet, Rfl: 4 .  gabapentin (NEURONTIN) 300 MG capsule, Take 2 capsules (600 mg total) by mouth 3 (three) times daily., Disp: 540 capsule, Rfl: 3 .  glucose blood (ONETOUCH VERIO) test strip, Use to check blood sugar up to 7 times weekly. diag code E11.40. Insulin dependent, Disp: 25 each, Rfl: 8 .  Insulin Degludec-Liraglutide (XULTOPHY) 100-3.6 UNIT-MG/ML SOPN, Inject 45 Units into the skin daily for 30 doses., Disp: 3 mL, Rfl: 3 .  Insulin Pen Needle 32G X 4 MM MISC, Use to inject insulin up to 6 times a day, Disp: 500 each, Rfl: 3 .  Insulin  Syringe-Needle U-100 31G X 15/64" 0.5 ML MISC, Use to inject insulin up to 4 times a day, Disp: 400 each, Rfl: 3 .  ipratropium-albuterol (DUONEB) 0.5-2.5 (3) MG/3ML SOLN, 3 ml as needed, Disp: , Rfl:  .  ipratropium-albuterol (DUONEB) 0.5-2.5 (3) MG/3ML SOLN, Take 3 mLs by nebulization every 6 (six) hours as needed for up to 28 days., Disp: 24 mL, Rfl: 3 .  Lancets Misc. (ACCU-CHEK FASTCLIX LANCET) KIT, Check your blood 4 times a day dx code 250.00 insulin requiring, Disp: 1 kit, Rfl: 2 .  lidocaine (LIDODERM) 5 %, Place 1 patch onto the skin every 12 (twelve) hours. Remove & Discard patch within 12 hours or as directed by MD, Disp: 60 patch, Rfl: 2 .  metoprolol succinate (TOPROL-XL) 50 MG 24 hr tablet, Take 1 tablet (50 mg total) by mouth daily. Take with or immediately following a meal., Disp: 90 tablet, Rfl: 3 .  nystatin (NYSTATIN) powder, Apply 1 application topically 3 (three) times daily as needed., Disp: 60 g, Rfl:  4 .  omeprazole (PRILOSEC) 40 MG capsule, Take 1 capsule (40 mg total) by mouth in the morning and at bedtime., Disp: 180 capsule, Rfl: 1 .  OXYGEN, Inhale 2 L into the lungs at bedtime., Disp: , Rfl:  .  potassium chloride SA (KLOR-CON) 20 MEQ tablet, Take 2 tabs by mouth every morning and 1 tab every evening to replace potassium lost from lasix use., Disp: 270 tablet, Rfl: 3 .  PRESCRIPTION MEDICATION, Inhale into the lungs at bedtime. CPAP, Disp: , Rfl:  .  rosuvastatin (CRESTOR) 20 MG tablet, TAKE 1 TABLET BY MOUTH ONCE EVERY NIGHT AT BEDTIME, Disp: 90 tablet, Rfl: 3 .  terbinafine (LAMISIL) 1 % cream, Apply cream to clean and dry feet twice a day until gone.  Apply to toes, and to sides, heels, and soles of feet., Disp: 30 g, Rfl: 1 .  warfarin (COUMADIN) 2.5 MG tablet, Take 2.97m warfarin on Thursday, Friday, Saturday and Sunday.  016mon Mondays; 1.2571m1/2 x 2.5mg72mblet) on Tuesday and Wednesday., Disp: 20 tablet, Rfl: 2  BP (!) 142/47 (BP Location: Right Arm, Patient  Position: Sitting, Cuff Size: Small)   Pulse (!) 58   Temp 98 F (36.7 C) (Oral)   Ht 5' 2" (1.575 m)   Wt 167 lb 8 oz (76 kg)   SpO2 100%   BMI 30.64 kg/m  Mood and affect are normal today.  Lungs with bibasilar coarse rales, nonlabored breathing, no wheezing, good airflow.  Heart regular rate and rhythm.  Legs:  R 2+ pitting edema distal to midcalf, 1+ on L.  R knee with mild effusion.  Nearly completely healed anterior patellar contusion with superficial small eschar remaining.  Patella is very tender and is ballotable.  It appears to track normally through extension and flexion.  Minimal crepitus. No patellar tendon tenderness.  Feet are chronically dusky and cool, this is unchanged.  No worrisome skin lesions.  Dry cracked skin, probable tinea pedis. Toenails long and sharp and were reduced with nippers and file today.  She is wearing street shoes, lightweight flexible sneaker type. No pressure wounds.  Assessment and Plan (see also problem based documentation)  Ms. ElkhAabergstable at this time, though at a suboptimal functional state and with poor sense of well-being due to her chronic conditions, particularly generalized pain.  The past (nearly) year has been difficult for her, manifested by many medical provider visits and tests.  Now that her Fe-deficiency anemia has been investigated with endoscopy and her counts are fairly stable, and now that the suspicion of amyloid cardiomyopathy has been ruled, out, it is hoped that she may have fewer appointments and tests (which lead to understandable anxiety).  Unfortunately, her screening chest CT ordered by pulmonologist has identified a R kidney lesion which will be evaluated with renal US [place] by her pulmonologist (Ms. ElkhClavin informed me in the recent past that she wasn't interested in quitting smoking, and that if she developed lung cancer, she may not wish to do anything about it "I don't think I could go through any treatment";  therefore we had decided not to pursue the screening chest CT) .  Heart failure is stable at this time, as is her COPD.  She has repeatedly emphasized that she does not wish to entertain the thought of quitting smoking.  Current goals are to work on diabetes management, pain management, and f/u the recently identified renal lesion.  I will continue to meet with her monthly in  order to maintain management and monitoring of her many conditions, specialists/prescribers, and medications.  As goals of care become more clear, it would be ideal for the bulk of her care to be provided through our clinic rather than parsed among several specialists, unless a procedure or decompensation outside of our abilities arises.    "Tell Butch Penny my diabetes is fine, its the meter that's messed up!" Laughs.

## 2020-06-01 ENCOUNTER — Encounter: Payer: Self-pay | Admitting: Internal Medicine

## 2020-06-01 DIAGNOSIS — N289 Disorder of kidney and ureter, unspecified: Secondary | ICD-10-CM

## 2020-06-01 HISTORY — DX: Disorder of kidney and ureter, unspecified: N28.9

## 2020-06-01 MED ORDER — GABAPENTIN 300 MG PO CAPS: 600.0000 mg | ORAL_CAPSULE | Freq: Three times a day (TID) | ORAL | 3 refills | Status: DC

## 2020-06-01 MED ORDER — ROSUVASTATIN CALCIUM 20 MG PO TABS: ORAL_TABLET | ORAL | 3 refills | Status: DC

## 2020-06-01 NOTE — Assessment & Plan Note (Signed)
Auscultates regular at today's visit.  No palpitations reported.  Chronic and stable. Monitor.

## 2020-06-01 NOTE — Assessment & Plan Note (Signed)
Toenails trimmed and filed this visit

## 2020-06-01 NOTE — Progress Notes (Signed)
Things That May Be Affecting Your Health:  Alcohol  Hearing loss x Pain   xx Depression  Home Safety  Sexual Health  x Diabetes  Lack of physical activity  Stress  x Difficulty with daily activities  Loneliness  Tiredness   Drug use  Medicines x Tobacco use- resistant to counseling  x Falls  Motor Vehicle Safety  Weight   Food choices  Oral Health  Other    YOUR PERSONALIZED HEALTH PLAN : 1. Schedule your next subsequent Medicare Wellness visit in one year 2. Attend all of your regular appointments to address your medical issues 3. Complete the preventative screenings and services   Annual Wellness Visit   Medicare Covered Preventative Screenings and [place] Men and Women Who How Often Need? Date of Last Service Action  Abdominal Aortic Aneurysm Adults with AAA risk factors Once      Alcohol Misuse and Counseling All Adults Screening once a year if no alcohol misuse. Counseling up to 4 face to face sessions.     Bone Density Measurement  Adults at risk for osteoporosis Once every 2 yrs x 2016    Lipid Panel Z13.6 All adults without CV disease Once every 5 yrs       Colorectal Cancer   Stool sample or  Colonoscopy All adults 54 and older   Once every year  Every 10 years   2022     Depression All Adults Once a year  Today   Diabetes Screening Blood glucose, post glucose load, or GTT Z13.1  All adults at risk  Pre-diabetics  Once per year  Twice per year      Diabetes  Self-Management Training All adults Diabetics 10 hrs first year; 2 hours subsequent years. Requires Copay     Glaucoma  Diabetics  Family history of glaucoma  African Americans 50 yrs +  Hispanic Americans 65 yrs + Annually - requires coppay ?     Hepatitis C Z72.89 or F19.20  High Risk for HCV  Born between 1945 and 1965  Annually  Once      HIV Z11.4 All adults based on risk  Annually btw ages 44 & 40 regardless of risk  Annually > 65 yrs if at increased  risk      Lung Cancer Screening Asymptomatic adults aged 52-77 with 30 pack yr history and current smoker OR quit within the last 15 yrs Annually Must have counseling and shared decision making documentation before first screen   recent   Medical Nutrition Therapy Adults with   Diabetes  Renal disease  Kidney transplant within past 3 yrs 3 hours first year; 2 hours subsequent years     Obesity and Counseling All adults Screening once a year Counseling if BMI 30 or higher  Today   Tobacco Use Counseling Adults who use tobacco  Up to 8 visits in one year   She is very resistant, also getting this from her pulmonologist.  Vaccines Z23  Hepatitis B  Influenza   Pneumonia  Adults   Once  Once every flu season  Two different vaccines separated by one year     Next Annual Wellness Visit People with Medicare Every year  Today     Services & Screenings Women Who How Often Need  Date of Last Service Action  Mammogram  Z12.31 Women over 40 One baseline ages 11-39. Annually ager 89 yrs+  12/2019    Pap tests All women Annually if high risk.  Every 2 yrs for normal risk women      Screening for cervical cancer with   Pap (Z01.419 nl or Z01.411abnl) &  HPV Z11.51 Women aged 60 to 68 Once every 5 yrs     Screening pelvic and breast exams All women Annually if high risk. Every 2 yrs for normal risk women  Sees gyn   Sexually Transmitted Diseases  Chlamydia  Gonorrhea  Syphilis All at risk adults Annually for non pregnant females at increased risk         [place] Men Who How Ofter Need  Date of Last Service Action  Prostate Cancer - DRE & PSA Men over 50 Annually.  DRE might require a copay.        Sexually Transmitted Diseases  Syphilis All at risk adults Annually for men at increased risk      Health Maintenance List Health Maintenance  Topic Date Due  . COVID-19 Vaccine (3 - Moderna risk 4-dose series) 06/15/2019  . HEMOGLOBIN A1C  07/13/2020  .  INFLUENZA VACCINE  09/10/2020  . OPHTHALMOLOGY EXAM  03/01/2021  . LIPID PANEL  04/12/2021  . FOOT EXAM  05/31/2021  . MAMMOGRAM  01/09/2022  . TETANUS/TDAP  05/17/2026  . DEXA SCAN  Completed  . Hepatitis C Screening  Completed  . PNA vac Low Risk Adult  Completed  . HPV VACCINES  Aged Out  . URINE MICROALBUMIN  Discontinued  . COLONOSCOPY (Pts 45-73yr Insurance coverage will need to be confirmed)  Discontinued

## 2020-06-04 ENCOUNTER — Telehealth: Payer: Self-pay | Admitting: Pharmacist

## 2020-06-04 DIAGNOSIS — I480 Paroxysmal atrial fibrillation: Secondary | ICD-10-CM | POA: Diagnosis not present

## 2020-06-04 DIAGNOSIS — Z7901 Long term (current) use of anticoagulants: Secondary | ICD-10-CM | POA: Diagnosis not present

## 2020-06-04 NOTE — Assessment & Plan Note (Signed)
Doing reasonably well, despite ongoing social/family stressors. Continue to monitor on current therapy.  PHQ9 not completed as we are monitoring her symptoms rather than scores.

## 2020-06-04 NOTE — Assessment & Plan Note (Signed)
EGD and colonoscopy ([facility]) no obvious bleeding sources; multiple colonic polyps identified. Benign. Monitor.

## 2020-06-04 NOTE — Assessment & Plan Note (Signed)
No esophagitis on recent EGD. Monitor.

## 2020-06-04 NOTE — Assessment & Plan Note (Signed)
F/u study has been ordered by Dr. Annamaria Boots.

## 2020-06-04 NOTE — Assessment & Plan Note (Signed)
A1c 8.1 last month, next due 07/2020.  Glucose readings not downloaded today. Will update next visit in a month.

## 2020-06-04 NOTE — Telephone Encounter (Signed)
Patient reports PST FS POC INR value today after an interruption in warfarin for procedure. 1.3 value today reflects the interruption. She has recommenced on her usual 12.33m warfarin total per wk and will repeat PST next Monday 2-MAY-22.

## 2020-06-07 ENCOUNTER — Ambulatory Visit
Admission: RE | Admit: 2020-06-07 | Discharge: 2020-06-07 | Disposition: A | Discharge status: Home / Self Care | Payer: HMO | Source: Ambulatory Visit | Attending: Internal Medicine | Admitting: Internal Medicine

## 2020-06-07 DIAGNOSIS — N2889 Other specified disorders of kidney and ureter: Secondary | ICD-10-CM | POA: Diagnosis not present

## 2020-06-07 DIAGNOSIS — N289 Disorder of kidney and ureter, unspecified: Secondary | ICD-10-CM

## 2020-06-07 DIAGNOSIS — Z85528 Personal history of other malignant neoplasm of kidney: Secondary | ICD-10-CM | POA: Diagnosis not present

## 2020-06-07 DIAGNOSIS — N281 Cyst of kidney, acquired: Secondary | ICD-10-CM | POA: Diagnosis not present

## 2020-06-08 NOTE — Progress Notes (Signed)
Ultrasound of kidney- abnormal with mass.  Order- referral to Dr Diona Fanti, [facility], who has seen her before.

## 2020-06-10 ENCOUNTER — Encounter: Payer: Self-pay | Admitting: *Deleted

## 2020-06-10 ENCOUNTER — Other Ambulatory Visit: Payer: Self-pay | Admitting: *Deleted

## 2020-06-10 DIAGNOSIS — N2889 Other specified disorders of kidney and ureter: Secondary | ICD-10-CM

## 2020-06-11 ENCOUNTER — Telehealth: Payer: Self-pay | Admitting: Dietician

## 2020-06-11 ENCOUNTER — Telehealth: Payer: Self-pay

## 2020-06-11 NOTE — Telephone Encounter (Signed)
Support provided.

## 2020-06-11 NOTE — Telephone Encounter (Signed)
Pls contact [facility] regarding order for supplies [phone]

## 2020-06-12 ENCOUNTER — Telehealth: Payer: Self-pay | Admitting: Pharmacist

## 2020-06-12 NOTE — Telephone Encounter (Signed)
Patient texted results of PST FS POC INR 1.8 (goal 1.5 - 2.5) on 12.83m total warfarin/wk. Continue same regimen: 1 x 2.536mon Su/Th/Fr/Sat; 1/2 x 2.57m69m1.257m72mse) on Tu/We; NO warfarin on Mondays. Repeat INR 9-MAY-22. No bleeding reported.

## 2020-06-12 NOTE — Telephone Encounter (Signed)
[name] with [employer] called requesting name of new PCP as Dr. Lynnae January was last listed for oxygen order from [facility]. Explained Dr. [name] will be following patient for her oxygen orders.

## 2020-06-15 DIAGNOSIS — Z85528 Personal history of other malignant neoplasm of kidney: Secondary | ICD-10-CM | POA: Diagnosis not present

## 2020-06-15 DIAGNOSIS — C649 Malignant neoplasm of unspecified kidney, except renal pelvis: Secondary | ICD-10-CM | POA: Diagnosis not present

## 2020-06-19 ENCOUNTER — Other Ambulatory Visit: Payer: Self-pay

## 2020-06-19 ENCOUNTER — Other Ambulatory Visit (HOSPITAL_COMMUNITY): Payer: Self-pay | Admitting: Urology

## 2020-06-19 ENCOUNTER — Ambulatory Visit: Payer: HMO | Admitting: Behavioral Health

## 2020-06-19 DIAGNOSIS — F331 Major depressive disorder, recurrent, moderate: Secondary | ICD-10-CM

## 2020-06-19 DIAGNOSIS — C649 Malignant neoplasm of unspecified kidney, except renal pelvis: Secondary | ICD-10-CM

## 2020-06-19 DIAGNOSIS — F419 Anxiety disorder, unspecified: Secondary | ICD-10-CM

## 2020-06-19 NOTE — BH Specialist Note (Signed)
Integrated Behavioral Health via Telemedicine Visit  06/19/2020 Corby Villasenor [id number]  Number of Integrated Behavioral Health visits: 7 Session Start time: 10:30am  Session End time: 11:00am Total time: 30  Referring Provider: Dr. Dorian Pod, MD Patient/Family location: Pt @ home in private [facility] Provider location: [facility] Office All persons participating in visit: Pt & Clinician Types of Service: Individual psychotherapy  I connected with [name] and/or Evans Lance Forbis's self via  Telephone or Video Enabled Telemedicine Application  (Video is Caregility application) and verified that I am speaking with the correct person using two identifiers. Discussed confidentiality: Yes   I discussed the limitations of telemedicine and the availability of in person appointments.  Discussed there is a possibility of technology failure and discussed alternative modes of communication if that failure occurs.  I discussed that engaging in this telemedicine visit, they consent to the provision of behavioral healthcare and the services will be billed under their insurance.  Patient and/or legal guardian expressed understanding and consented to Telemedicine visit: Yes   Presenting Concerns: Patient and/or family reports the following symptoms/concerns: Elevated anx due to need for MRI on Sunday for 2, L-sided masses Duration of problem: a few wks; Severity of problem: moderate  Patient and/or Family's Strengths/Protective Factors: Social connections, Social and Emotional competence and Concrete supports in place (healthy food, safe environments, etc.)  Goals Addressed: Patient will: 1.  Reduce symptoms of: anxiety, depression and stress  2.  Increase knowledge and/or ability of: healthy habits and stress reduction  3.  Demonstrate ability to: Increase healthy adjustment to current life circumstances  Progress towards Goals: Ongoing  Interventions: Interventions  utilized:  Supportive Counseling Standardized Assessments completed: Not Needed  Patient and/or Family Response: Pt receptive to call today & requests future appt  Assessment: Patient currently experiencing elevated Sx of anx due to concern for upcoming MRI scheduled to assess 2, L-sided masses of the kidney. Pt explaining her Hx of health status changes that have lft her w/adjustment issues & reliance on her Str for transportation.  Patient may benefit from cont'd f/u for improved adjustment to health issues as they evolve/change.  Plan: 1. Follow up with behavioral health clinician on : 2-3 wks on 30 min telehealth 2. Behavioral recommendations: Pt can journal btwn sessions to keep focused on her health concerns, new Px issues, & psychological concerns for family 3. Referral(s): [place] (In Clinic)  I discussed the assessment and treatment plan with the patient and/or parent/guardian. They were provided an opportunity to ask questions and all were answered. They agreed with the plan and demonstrated an understanding of the instructions.   They were advised to call back or seek an in-person evaluation if the symptoms worsen or if the condition fails to improve as anticipated.  Donnetta Hutching, LMFT

## 2020-06-24 ENCOUNTER — Other Ambulatory Visit: Payer: Self-pay

## 2020-06-24 ENCOUNTER — Ambulatory Visit (HOSPITAL_COMMUNITY)
Admission: RE | Admit: 2020-06-24 | Discharge: 2020-06-24 | Disposition: A | Discharge status: Home / Self Care | Payer: HMO | Source: Ambulatory Visit | Attending: Urology | Admitting: Urology

## 2020-06-24 DIAGNOSIS — Z85528 Personal history of other malignant neoplasm of kidney: Secondary | ICD-10-CM | POA: Diagnosis not present

## 2020-06-24 DIAGNOSIS — N289 Disorder of kidney and ureter, unspecified: Secondary | ICD-10-CM | POA: Diagnosis not present

## 2020-06-24 DIAGNOSIS — C649 Malignant neoplasm of unspecified kidney, except renal pelvis: Secondary | ICD-10-CM | POA: Diagnosis not present

## 2020-06-24 DIAGNOSIS — I700 Atherosclerosis of aorta: Secondary | ICD-10-CM | POA: Diagnosis not present

## 2020-06-24 DIAGNOSIS — K8020 Calculus of gallbladder without cholecystitis without obstruction: Secondary | ICD-10-CM | POA: Diagnosis not present

## 2020-06-24 MED ORDER — GADOBUTROL 1 MMOL/ML IV SOLN
7.0000 mL | Freq: Once | INTRAVENOUS | Status: AC | PRN
  Administered 2020-06-24: 7 mL via INTRAVENOUS

## 2020-06-25 ENCOUNTER — Telehealth: Payer: Self-pay | Admitting: Pharmacist

## 2020-06-25 NOTE — Telephone Encounter (Signed)
Patient texts resultes of PST FS POC INR 1.8 (target 1.5 - 2.5). Will remain on same regimen:  NO warfarin on Mondays; 1/2 x 2.41m warfarin (1.268m on Tu/We; 1x2.86m31mn Th/Fr/Sa/Su. Repeat PST on Monday 23-MAY-22.

## 2020-06-27 ENCOUNTER — Ambulatory Visit (HOSPITAL_COMMUNITY): Payer: HMO

## 2020-06-28 ENCOUNTER — Telehealth: Payer: Self-pay | Admitting: Behavioral Health

## 2020-06-28 DIAGNOSIS — J449 Chronic obstructive pulmonary disease, unspecified: Secondary | ICD-10-CM | POA: Diagnosis not present

## 2020-06-28 DIAGNOSIS — I050 Rheumatic mitral stenosis: Secondary | ICD-10-CM | POA: Diagnosis not present

## 2020-06-28 DIAGNOSIS — I509 Heart failure, unspecified: Secondary | ICD-10-CM | POA: Diagnosis not present

## 2020-06-28 NOTE — Telephone Encounter (Signed)
Contacted Pt re: completion of her MRI on Sun. Pt is waiting for results & briefly discussed her health status changes & related concerns. Pt grateful for f/u.  Dr. Theodis Shove

## 2020-07-02 ENCOUNTER — Other Ambulatory Visit: Payer: Self-pay | Admitting: Urology

## 2020-07-02 DIAGNOSIS — I480 Paroxysmal atrial fibrillation: Secondary | ICD-10-CM | POA: Diagnosis not present

## 2020-07-02 DIAGNOSIS — Z7901 Long term (current) use of anticoagulants: Secondary | ICD-10-CM | POA: Diagnosis not present

## 2020-07-02 DIAGNOSIS — N2889 Other specified disorders of kidney and ureter: Secondary | ICD-10-CM

## 2020-07-04 ENCOUNTER — Other Ambulatory Visit: Payer: Self-pay | Admitting: Dietician

## 2020-07-04 DIAGNOSIS — G8929 Other chronic pain: Secondary | ICD-10-CM

## 2020-07-04 DIAGNOSIS — M797 Fibromyalgia: Secondary | ICD-10-CM

## 2020-07-04 MED ORDER — DULOXETINE HCL 60 MG PO CPEP: 60.0000 mg | ORAL_CAPSULE | Freq: Two times a day (BID) | ORAL | 3 refills | Status: DC

## 2020-07-04 MED ORDER — HYDROCODONE-ACETAMINOPHEN 5-325 MG PO TABS: 2.0000 | ORAL_TABLET | Freq: Three times a day (TID) | ORAL | 0 refills | Status: DC | PRN

## 2020-07-04 NOTE — Telephone Encounter (Signed)
Rx that ran out a few months ago, you notice that you did not have it last week.  Called pharmacy last week asking them to request a refill, called them again today, they haven't gotten anything back from our office.    The medication is duloxetine.  Also requests her pain medication. This one is hydrocodone-acetaminophen.

## 2020-07-08 IMAGING — MR MR HEAD WO/W CM
10 of 13 series · 36 of 48 positions shown · IV contrast (gadavist)
Comparison: 02/19/2018 CT head.  08/03/2010 MRI head.

CLINICAL DATA: 67 y/o F; 2 months of headache. Uncontrolled
tremors. History of renal cell carcinoma.

EXAM:
MRI HEAD WITHOUT AND WITH CONTRAST
TECHNIQUE: Multiplanar, multiecho pulse sequences of the brain and surrounding
structures were obtained without and with intravenous contrast.
CONTRAST:  8 cc Gadavist

[Series 3: DWI · axial · 3.0mm · 1.09mm/px · z∈[-3,+135]mm · 8 of 100 slices shown (1 of 4)]
[im 1/100]
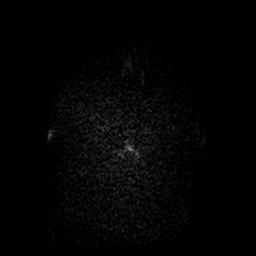
[im 15/100]
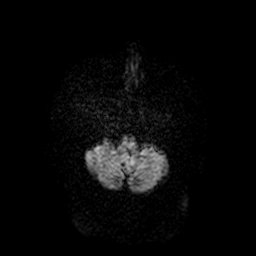
[im 29/100]
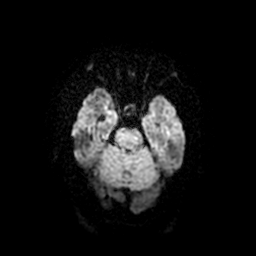
[im 43/100]
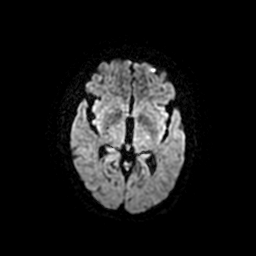
[im 57/100]
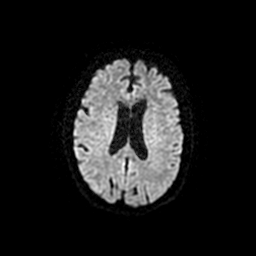
[im 71/100]
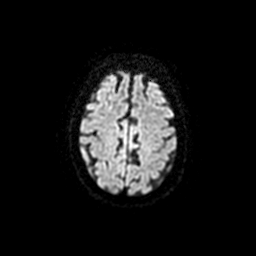
[im 85/100]
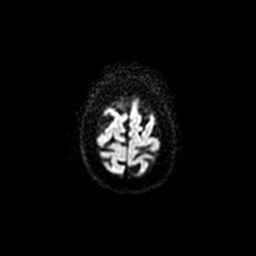
[im 100/100]
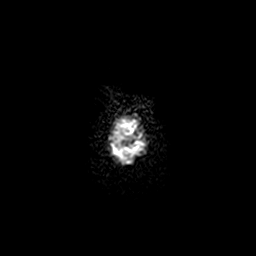

[Series 4: T1 · sagittal · 5.0mm · 0.47mm/px · 2 of 24 slices shown]
[im 1/24]
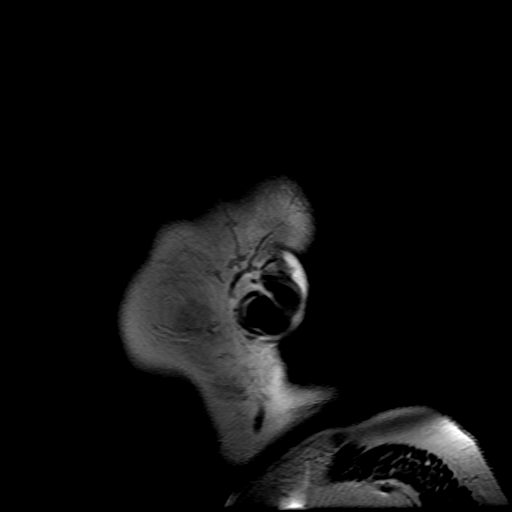
[im 24/24]
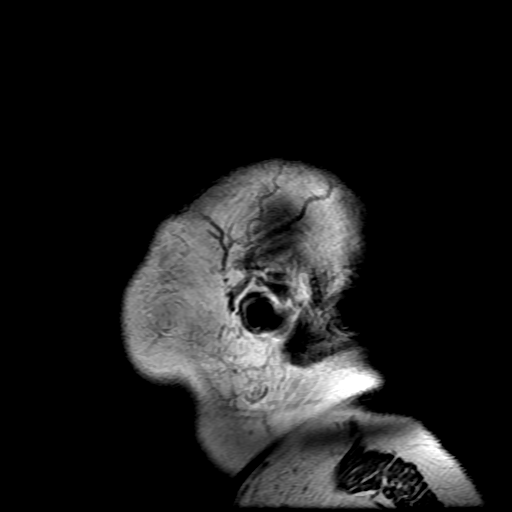

[Series 5: DWI · coronal · 4.0mm · 1.09mm/px · 8 of 92 slices shown (2 of 4)]
[im 1/92]
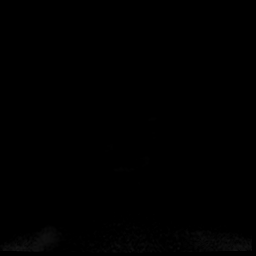
[im 14/92]
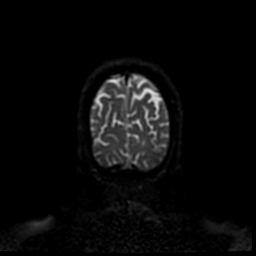
[im 27/92]
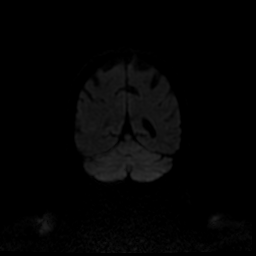
[im 40/92]
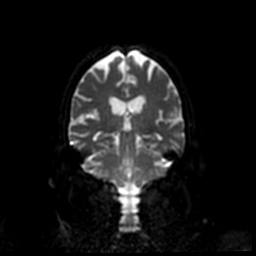
[im 53/92]
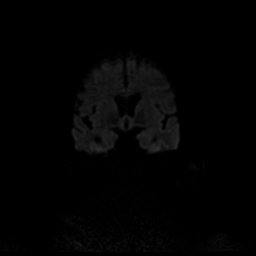
[im 66/92]
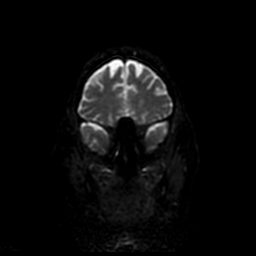
[im 79/92]
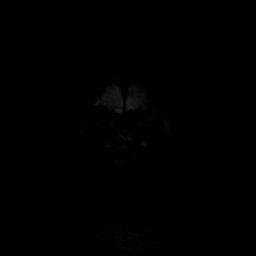
[im 92/92]
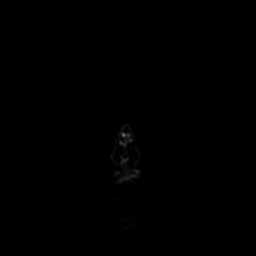

[Series 6: T2 · axial · 5.0mm · 0.43mm/px · z∈[-43,+115]mm · 2 of 28 slices shown]
[im 1/28]
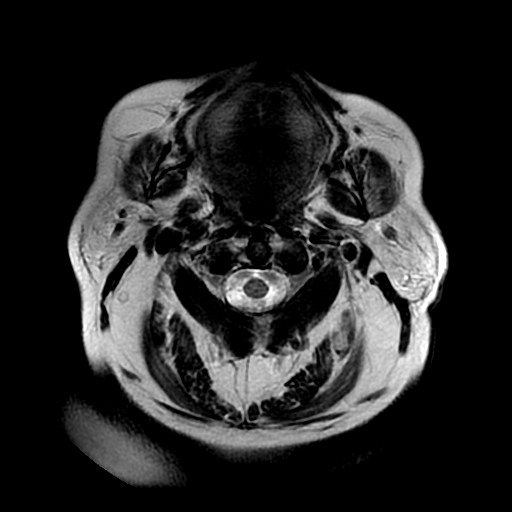
[im 28/28]
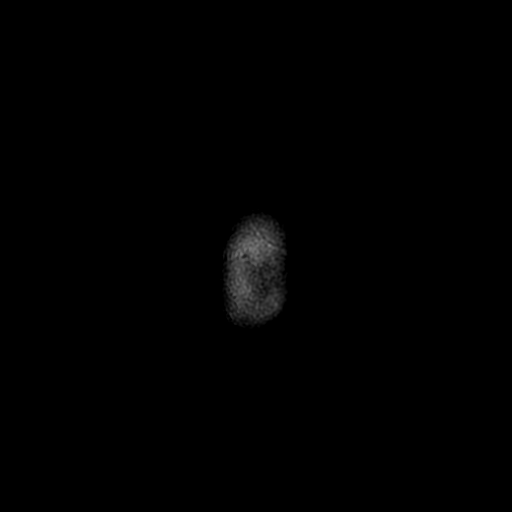

[Series 7: FLAIR · axial · 3.0mm · 0.43mm/px · z∈[-43,+115]mm · 2 of 28 slices shown]
[im 1/28]
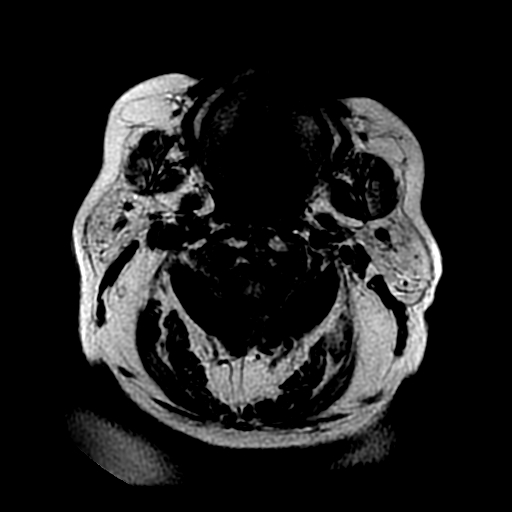
[im 28/28]
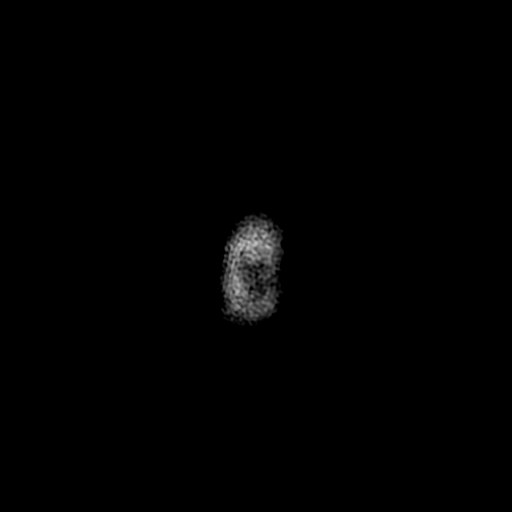

[Series 10: T2 post-contrast · coronal · 5.0mm · 0.45mm/px · 2 of 26 slices shown]
[im 1/26]
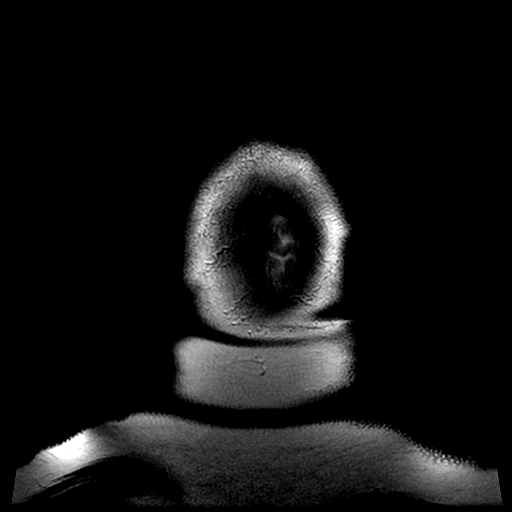
[im 26/26]
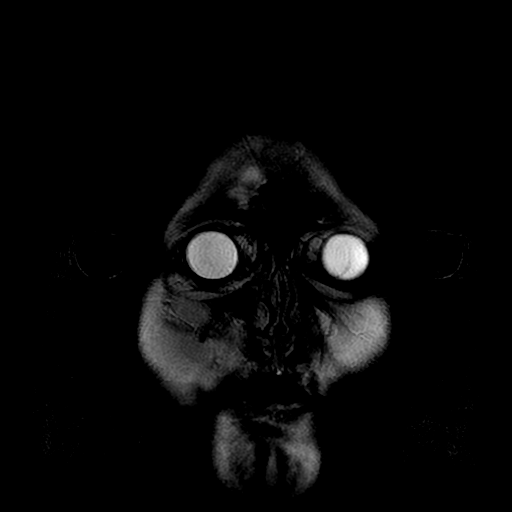

[Series 12: T1 post-contrast · coronal · 5.0mm · 0.45mm/px · 2 of 26 slices shown (1 of 2)]
[im 1/26]
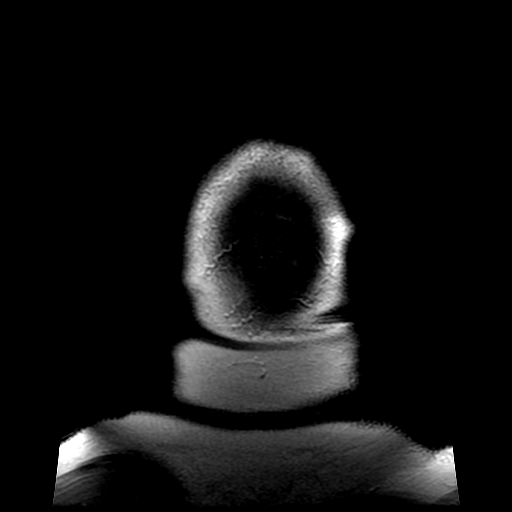
[im 26/26]
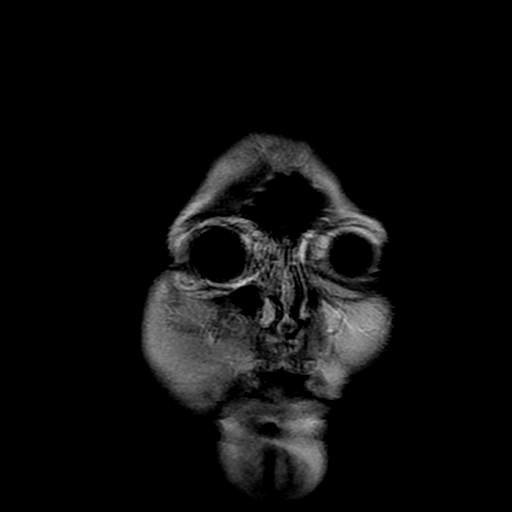

[Series 13: T1 post-contrast · sagittal · 5.0mm · 0.47mm/px · 2 of 24 slices shown (2 of 2)]
[im 1/24]
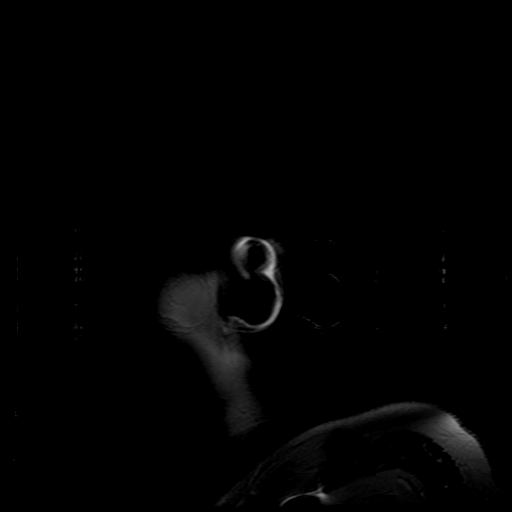
[im 24/24]
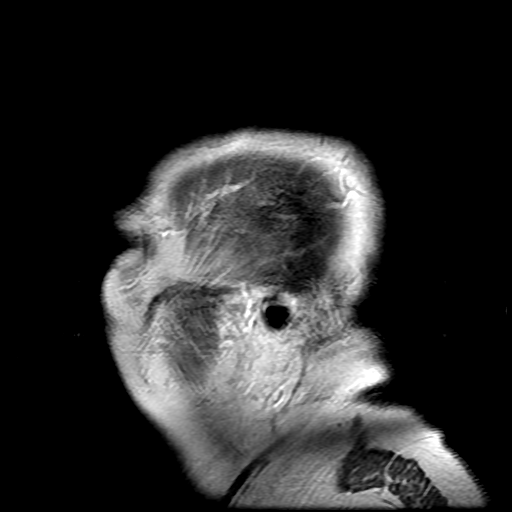

[Series 300: DWI · axial · 3.0mm · 1.09mm/px · z∈[-3,+135]mm · 4 of 50 slices shown (3 of 4)]
[im 1/50]
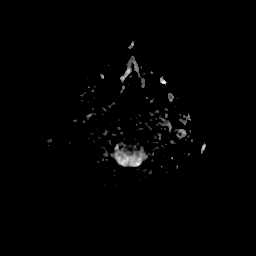
[im 17/50]
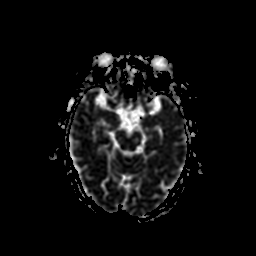
[im 33/50]
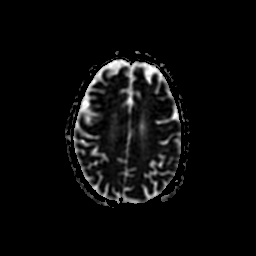
[im 50/50]
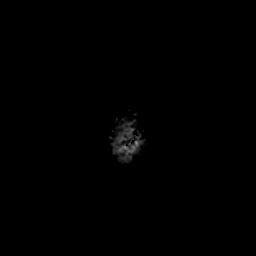

[Series 500: DWI · coronal · 4.0mm · 1.09mm/px · 4 of 46 slices shown (4 of 4)]
[im 1/46]
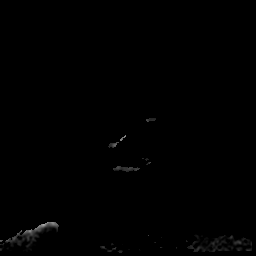
[im 16/46]
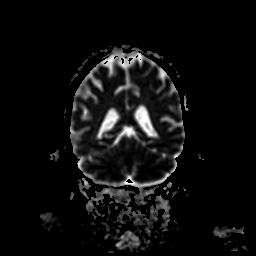
[im 31/46]
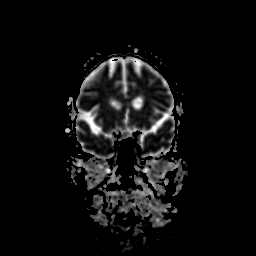
[im 46/46]
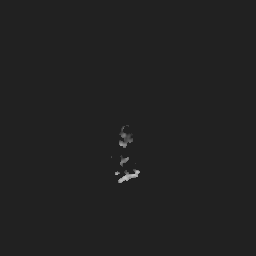

[36 of 48 positions shown; findings below may reference images not displayed]

FINDINGS: Brain: Motion degradation of several sequences. No acute infarction,
hemorrhage, hydrocephalus, extra-axial collection or mass lesion.
Punctate nonspecific T2 FLAIR hyperintensities in subcortical and
periventricular white matter as well as confluent hyperintensities
within the pons with mild interval progression from prior MRI of the
head.. Progressed moderate volume loss of the brain. Stable tiny
chronic lacunar infarct of right caudate nucleus. After
administration of intravenous contrast no abnormal enhancement of
the brain is identified.

Vascular: Normal flow voids.

Skull and upper cervical spine: Normal marrow signal.

Sinuses/Orbits: Negative.

Other: Bilateral intra-ocular lens replacement.
IMPRESSION: 1. No acute intracranial process or abnormal enhancement of the
brain identified.
2. Mild progression supratentorial and pontine white matter
hyperintensities, probably representing chronic microvascular
ischemic changes from 1561. Progressed moderate volume loss of the
brain.

## 2020-07-10 ENCOUNTER — Telehealth: Payer: Self-pay | Admitting: Pharmacist

## 2020-07-10 ENCOUNTER — Other Ambulatory Visit: Payer: Self-pay

## 2020-07-10 ENCOUNTER — Ambulatory Visit: Payer: HMO | Admitting: Behavioral Health

## 2020-07-10 DIAGNOSIS — F331 Major depressive disorder, recurrent, moderate: Secondary | ICD-10-CM

## 2020-07-10 DIAGNOSIS — F419 Anxiety disorder, unspecified: Secondary | ICD-10-CM

## 2020-07-10 NOTE — Telephone Encounter (Signed)
Patient called reporting PST FS POC INR value for today = 1.5 on 1/2 x 2.2m Tu/Wed; 1x2.557mall other days--EXCEPT on Mondays, NO DOSE. Was advised to INCREASE to 2.43m39mu/Th/Fr/Sa/Su; No warfarin on Monday; 1/2 x 2.5 mg on Wed. Repeat INR on 6-JUN-22. Denies any symptoms of bleeding or DVT/PE/Chest pain, etc.

## 2020-07-10 NOTE — BH Specialist Note (Signed)
Integrated Behavioral Health via Telemedicine Visit  07/10/2020 [name] [id number]  Number of Integrated Behavioral Health visits: 8 Session Start time: 3:40pm  Session End time: 4:10pm Total time: 30  Referring Provider: Dr. Dorian Pod, MD Patient/Family location: Pt @ home in private [facility] Provider location: [facility] Office All persons participating in visit: Pt & Clinician Types of Service: Individual psychotherapy  I connected with [name] and/or Evans Lance Bogucki's self via  Telephone or Video Enabled Telemedicine Application  (Video is Caregility application) and verified that I am speaking with the correct person using two identifiers. Discussed confidentiality: Yes   I discussed the limitations of telemedicine and the availability of in person appointments.  Discussed there is a possibility of technology failure and discussed alternative modes of communication if that failure occurs.  I discussed that engaging in this telemedicine visit, they consent to the provision of behavioral healthcare and the services will be billed under their insurance.  Patient and/or legal guardian expressed understanding and consented to Telemedicine visit: Yes   Presenting Concerns: Patient and/or family reports the following symptoms/concerns: inc'd contact w/family that has lft her w/improved feelings of worth Duration of problem: several wks; Severity of problem: mild  Patient and/or Family's Strengths/Protective Factors: Social connections, Social and Emotional competence and Concrete supports in place (healthy food, safe environments, etc.)  Goals Addressed: Patient will: 1.  Reduce symptoms of: anxiety, depression and loneliness  2.  Increase knowledge and/or ability of: coping skills  3.  Demonstrate ability to: Increase healthy adjustment to current life circumstances  Progress towards Goals: Ongoing  Interventions: Interventions utilized:   Supportive Counseling Standardized Assessments completed: screeners prn  Patient and/or Family Response: Pt receptive to call today & requests future ck-ins  Assessment: Patient currently experiencing reduced anx/dep due to family support.   Patient may benefit from cont'd ck-ins to reduce loneliness & Sx of anx/dep.  Plan: 1. Follow up with behavioral health clinician on : 3 wks for 30 min on telehealth 2. Behavioral recommendations: cont'd telehealth support & encouragement for improved family dynamics 3. Referral(s): [place] (In Clinic)  I discussed the assessment and treatment plan with the patient and/or parent/guardian. They were provided an opportunity to ask questions and all were answered. They agreed with the plan and demonstrated an understanding of the instructions.   They were advised to call back or seek an in-person evaluation if the symptoms worsen or if the condition fails to improve as anticipated.  Donnetta Hutching, LMFT

## 2020-07-12 ENCOUNTER — Encounter (HOSPITAL_COMMUNITY): Payer: HMO

## 2020-07-16 ENCOUNTER — Telehealth: Payer: Self-pay | Admitting: Pharmacist

## 2020-07-16 ENCOUNTER — Telehealth: Payer: Self-pay | Admitting: Dietician

## 2020-07-16 NOTE — Telephone Encounter (Signed)
Patient texts results of PST FS POC INR 1.8 (target goal for patient is 1.5 - 2.5) No bleeding or signs of embolic events. Will remain on same regimen of 1x2.23m warfarin Su/Tu/Th/Fri/Sa; 1/2 x 2.5 mg (1.229mdose) on Wed; NO warfarin on Monday. Repeat PST FS POC INR on 13-JUN-22.

## 2020-07-16 NOTE — Telephone Encounter (Signed)
Patient reports patient self testing fingerstick point of care INR result today 1.8.  Range for patient is 1.5 - 2.5. No bleeding, no symptoms of embolic event(s). Will leave upon same regimen:  Su-2.46mM-0mgT-2.5mgW-1.25mgTh-2.5mgF-2.5mgSa-2.5mg Repeat INR 13-JUN-22.

## 2020-07-16 NOTE — Telephone Encounter (Signed)
Ms Lapid is asking about [place] refilling her strips because she is getting a bottle of strips every monthy. She intends to call her pharmacy.

## 2020-07-23 ENCOUNTER — Telehealth: Payer: Self-pay | Admitting: Pharmacist

## 2020-07-23 NOTE — Telephone Encounter (Signed)
Patient texts results of PST FS POC INR 2.1 (target goal 1.5 - 2.5). Will continue same regimen of 1 tablet Tue/Thur/Fri/Sat/Sun; 1/2 tablet on Wednesday; NO warfarin on Monday. Repeat PST FS POC INR on 20JUN22. No bleeding. No dietary changes she states.

## 2020-07-24 ENCOUNTER — Telehealth: Payer: Self-pay | Admitting: Pharmacist

## 2020-07-24 DIAGNOSIS — E1140 Type 2 diabetes mellitus with diabetic neuropathy, unspecified: Secondary | ICD-10-CM

## 2020-07-24 DIAGNOSIS — Z794 Long term (current) use of insulin: Secondary | ICD-10-CM

## 2020-07-24 NOTE — Telephone Encounter (Addendum)
Patient called requesting to speak with me.  Called patient back and she states she needs help with her dry elbow. She was calling to ask me if I knew what they used several years back for her elbow when she fell and injured it. She described it as a bandage that "had a balloon but not a balloon to help protect it." She states she will apply cream to her elbow but that it all wipes off. We have no record of the particular bandage patient was describing and I told her I was not familiar with what this might be. I did recommend she apply any cream earlier in the day as opposed to before bedtime and that she rub the cream in well so it absorbs into her skin. Patient states she will try that.

## 2020-07-25 MED ORDER — XULTOPHY 100-3.6 UNIT-MG/ML ~~LOC~~ SOPN: 40.0000 [IU] | PEN_INJECTOR | Freq: Every day | SUBCUTANEOUS | 3 refills | Status: DC

## 2020-07-25 NOTE — Addendum Note (Signed)
Addended by: Dorian Pod A on: 07/25/2020 01:31 PM   Modules accepted: Orders

## 2020-07-25 NOTE — Telephone Encounter (Signed)
Patient notified to decrease her dose of xultophy. She verbalized understanding.

## 2020-07-25 NOTE — Telephone Encounter (Signed)
That sounds great- I will call [name] to let her know to decrease her dose of Xultophy to 40. She sees you next week on 6/23, so we can follow up

## 2020-07-25 NOTE — Telephone Encounter (Signed)
[name] uploaded her CGM data for [place] to see her low blood sugars. Sh says some of the time she does not have symptoms. She thinks they are due to the fact that she is not eating her usual amounts due to stress about her kidney cancer. Her CGM data for the past 2 weeks shows adequate time sensor was active for good accuracy and 9 low blood sugar events <70 in the past 7 days.   I told her I would consult Dr. [name] and that most likely she will need a decrease in her Xultophy. Laurin asked that we leave a message if she doe snot answer her phone

## 2020-07-29 DIAGNOSIS — I050 Rheumatic mitral stenosis: Secondary | ICD-10-CM | POA: Diagnosis not present

## 2020-07-29 DIAGNOSIS — J449 Chronic obstructive pulmonary disease, unspecified: Secondary | ICD-10-CM | POA: Diagnosis not present

## 2020-07-29 DIAGNOSIS — I509 Heart failure, unspecified: Secondary | ICD-10-CM | POA: Diagnosis not present

## 2020-07-31 ENCOUNTER — Other Ambulatory Visit: Payer: Self-pay

## 2020-07-31 ENCOUNTER — Ambulatory Visit
Admission: RE | Admit: 2020-07-31 | Discharge: 2020-07-31 | Disposition: A | Discharge status: Home / Self Care | Payer: HMO | Source: Ambulatory Visit | Attending: Urology | Admitting: Urology

## 2020-07-31 DIAGNOSIS — I480 Paroxysmal atrial fibrillation: Secondary | ICD-10-CM | POA: Diagnosis not present

## 2020-07-31 DIAGNOSIS — Z7901 Long term (current) use of anticoagulants: Secondary | ICD-10-CM | POA: Diagnosis not present

## 2020-07-31 DIAGNOSIS — N2889 Other specified disorders of kidney and ureter: Secondary | ICD-10-CM | POA: Diagnosis not present

## 2020-07-31 HISTORY — PX: IR RADIOLOGIST EVAL & MGMT: IMG5224

## 2020-07-31 NOTE — Progress Notes (Signed)
Chief Complaint: Patient was consulted remotely today (TeleHealth) for a left upper pole renal mass at the request of [place].    Referring Physician(s): Dahlstedt,Stephen  History of Present Illness: [name] is a 70 y.o. female known to me from prior cryoablation of a left posterior lower pole clear cell renal carcinoma on 09/19/2011 which demonstrated no evidence of recurrence for 5 years after treatment. A small indeterminant left upper pole renal lesion was studied by MRI on 06/25/2017 and measured 7 mm with findings at that time favoring a hemorrhagic cyst.  More recent imaging with screening chest CT in April of this year demonstrated an enlarging cystic lesion of the upper pole of the right kidney which led to a renal ultrasound on 06/08/2020 and MRI of the abdomen on 06/24/2020.  By MRI, there is now a 14 mm rounded lesion in the lateral cortex of the upper pole of the left kidney that demonstrates internal enhancement.  This is suspicious for a slow-growing neoplasm.  The upper pole right renal cyst appears benign.  Previous ablation defect of the lower left kidney appears stable without evidence of carcinoma recurrence.  [name] is asymptomatic with respect to the renal mass.  She continues to smoke and states that she smokes less than 1 pack of cigarettes per day.  She is using 2 L of oxygen only when sleeping with CPAP.  She is not requiring oxygen during the day while she is awake.  Past Medical History:  Diagnosis Date   Anxiety 07/24/2010   Aortic atherosclerosis ([place]) 10/19/2014   Seen on CT scan, currently asymptomatic   Arteriovenous malformation of gastrointestinal tract 08/08/2015   Non-bleeding when visualized on capsule endoscopy 06/30/2015    Arthritis    "lower back; hands" (02/19/2018)   Asymptomatic cholelithiasis 09/25/2015   Seen on CT scan 08/2015   Carotid artery stenosis; s/p R endarterectomy    s/p right endarterectomy (06/2010) Carotid  US (07/2010):  Left: Moderate-to-severe (60-79%) calcific and non-calcific plaque origin and proximal ICA and ECA    Chronic congestive heart failure with left ventricular diastolic dysfunction (HCC) 10/21/2010   Chronic constipation 02/03/2011   Chronic daily headache 01/16/2014   Chronic iron deficiency anemia    Chronic low back pain 10/06/2012   Chronic venous insufficiency 08/04/2012   COPD (chronic obstructive pulmonary disease) with emphysema (HCC)    PFTs 2018: severe obstructive disease, insignif response to bronchiodilator, mild restriction parenchymal pattern, moderately severe diffusion defect. 2014  FEV1 0.92 (40%), ratio 69, 27% increase in FEV1 with BD, TLC 91%, severe airtrapping, DLCO49% On chronic home O2. Pulmonary rehab referral 05/2012    Fibromyalgia 08/29/2010   Gastroesophageal reflux disease    History of blood transfusion    "several times"  (02/19/2018)   History of clear cell renal cell carcinoma ([place]), in remission 07/21/2011   s/p cryoablation of left RCC in 09/2011 by [name]. Followed by [name]  [facility]) .     History of hiatal hernia    History of mitral valve replacement with bioprosthetic valve due to mitral stenosis 2012   s/p MVR with a 27-mm pericardial porcine valve (Medtronic Mosaic valve, serial #[id number] on 09/20/10, Dr. Prescott Gum)    History of obstructive sleep apnea, resolved 2013   resolved per sleep study 07/2019; no apnea, but did have desaturation.  CPAP no longer necessary.  Nocturnal polysomnography (06/2009): Moderate sleep apnea/ hypopnea syndrome , AHI 17.8 per hour with nonpositional hypopneas. CPAP titration  to 12 CWP, AHI 2.4 per hour. On nocturnal CPAP via a small resMed Quattro full-face mask with heated humidifier.    History of pneumonia    "once"  (02/19/2018)   History of seborrheic keratosis 09/28/2015   Hyperlipidemia LDL goal < 100 11/20/2005   Internal and external hemorrhoids without complication 0/97/3532    Lichen sclerosus of female genitalia 01/12/2017   Migraine    "none in years" (02/19/2018)   Moderately severe major depression ([place]) 11/19/2005   Nocturnal hypoxia per sleep study 07/2019    Osteoporosis    DEXA 2016: T -2.7; DEXA (12/09/2011): L-spine T -3.7, left hip T -1.4 DEXA (12/2004): L-spine T -2.6, left hip -0.1    Paroxysmal atrial fibrillation ([place]) 10/22/2010   s/p Left atrial maze procedure for paroxysmal atrial fibrillation on 09/20/2010 by Dr Prescott Gum.  Subsequent splenic infarct, decision was made to re-anticoagulate with coumadin, likely life-long as this is the most likely cause of the splenic infarct.    Personal history of colonic polyps 05/14/2011   Colonoscopy (05/2011): 4 mm adenomatous polyp excised endoscopically Colonoscopy (02/2002): Adenomatous polyp excised endoscopically    Pulmonary hypertension due to chronic obstructive pulmonary disease ([place]) 04/25/2016   2014 TEE w PA peak pressure 46 mmHg, s/p MV replacement    Right nephrolithiasis, asymptomatic, incidental finding 09/06/2014   5 mm non-obstructing calculus seen on CT scan 09/05/2014    Right ventricular failure ([place]) 04/25/2016   Severe obesity (BMI 35.0-39.9) with comorbidity ([place]) 10/23/2011   Sleep apnea    Tobacco abuse 07/28/2012   Type 2 diabetes mellitus with diabetic neuropathy [facility])     Past Surgical History:  Procedure Laterality Date   CARDIAC CATHETERIZATION     CARDIAC VALVE REPLACEMENT  Aug. 2012   "mitral valve"   CAROTID ENDARTERECTOMY Right 07/04/2010   by [name] for asymptomatic right carotid artery stenosis   CATARACT EXTRACTION W/ INTRAOCULAR LENS  IMPLANT, BILATERAL Bilateral    CHEST TUBE INSERTION  09/24/2010   Dr Lucianne Lei Trigt   COLONOSCOPY  05/12/2011   performed by Dr. Michail Sermon. Showing small internal hemorrhoids, single tubular adenoma polyp   COLONOSCOPY WITH PROPOFOL N/A 05/29/2020   Procedure: COLONOSCOPY WITH PROPOFOL;  Surgeon: Clarene Essex, MD;  Location: [facility] END[facility];   Service: Endoscopy;  Laterality: N/A;   CRYOABLATION Left 09/2011   by [name]. Followed by [name]  [facility]) .     DILATION AND CURETTAGE OF UTERUS     ESOPHAGOGASTRODUODENOSCOPY  05/12/2011   performed by Dr. Michail Sermon. Negative for ulcerations, biopsy negative for evidence of celiac sprue   ESOPHAGOGASTRODUODENOSCOPY N/A 06/29/2015   Procedure: ESOPHAGOGASTRODUODENOSCOPY (EGD);  Surgeon: Clarene Essex, MD;  Location[facility] ENDOSCOPY;  Service: Endoscopy;  Laterality: N/A;   ESOPHAGOGASTRODUODENOSCOPY N/A 03/29/2016   Procedure: ESOPHAGOGASTRODUODENOSCOPY (EGD);  Surgeon: Clarene Essex, MD;  Location: [facility]OSCOPY;  Service: Endoscopy;  Laterality: N/A;   ESOPHAGOGASTRODUODENOSCOPY (EGD) WITH PROPOFOL N/A 05/29/2020   Procedure: ESOPHAGOGASTRODUODENOSCOPY (EGD) WITH PROPOFOL;  Surgeon: Clarene Essex, MD;  Location: [facility];  Service: Endoscopy;  Laterality: N/A;   FRACTURE SURGERY     GIVENS CAPSULE STUDY N/A 06/30/2015   Procedure: GIVENS CAPSULE STUDY;  Surgeon: Clarene Essex, MD;  Location: [place];  Service: Endoscopy;  Laterality: N/A;   GIVENS CAPSULE STUDY N/A 06/29/2015   Procedure: GIVENS CAPSULE STUDY;  Surgeon: Clarene Essex, MD;  Location: [place];  Service: Endoscopy;  Laterality: N/A;   HEMORRHOID SURGERY  1970s?   "lanced"   HYSTEROSCOPY W/  ENDOMETRIAL ABLATION  06/2001   for persistent post-menopausal bleeding // by S. [name], M.D.   IR GENERIC HISTORICAL  08/23/2015   IR RADIOLOGIST EVAL & MGMT 08/23/2015 Aletta Edouard, MD [facility] INTERV RAD   IR GENERIC HISTORICAL  04/09/2016   IR RADIOLOGIST EVAL & MGMT 04/09/2016 Aletta Edouard, MD [facility] INTERV RAD   IR RADIOLOGIST EVAL & MGMT  10/07/2016   IR RADIOLOGIST EVAL & MGMT  06/25/2017   LEFT HEART CATH AND CORONARY ANGIOGRAPHY N/A 04/21/2016   Procedure: Left Heart Cath and Coronary Angiography;  Surgeon: Lorretta Harp, MD;  Location: [place] [facility];  Service: Cardiovascular;  Laterality: N/A;   LIPOMA  EXCISION  08/2005   occipital lipoma 1.5cm - by Dr. [name]   LITHOTRIPSY  ~ 2000   MAZE Left 09/20/10   for paroxysmal atrial fibrillation (Dr. Prescott Gum)   MITRAL VALVE REPLACEMENT  09/20/10    with a 27-mm pericardial porcine valve (Medtronic Mosaic valve, serial #[id number]). 09/20/10, Dr Prescott Gum   ORIF CLAVICLE FRACTURE Right 01/2004   by Thana Farr. [name], M.D for Right clavicle nonunion.; "it's got a pin in it"   POLYPECTOMY  05/29/2020   Procedure: POLYPECTOMY;  Surgeon: Clarene Essex, MD;  Location: [facility] END[facility];  Service: Endoscopy;;   REFRACTIVE SURGERY Bilateral    RIGHT HEART CATH N/A 04/23/2016   Procedure: Right Heart Cath;  Surgeon: [name], MD;  Location: [place] [facility];  Service: Cardiovascular;  Laterality: N/A;   TONSILLECTOMY     TUBAL LIGATION      Allergies: Dexlansoprazole, Lorazepam, Other, Lorazepam, Morphine and related, Oxycontin [oxycodone], and Tramadol hcl  Medications: Prior to Admission medications   Medication Sig Start Date End Date Taking? Authorizing Provider  albuterol (PROAIR HFA) 108 (90 Base) MCG/ACT inhaler Inhale 2 puffs into the lungs every 6 (six) hours as needed for shortness of breath. 11/05/19   Angelica Pou, MD  ALPRAZolam (XANAX) 1 MG tablet TAKE 1 TABLET BY MOUTH EVERY NIGHT AT BEDTIME AS NEEDED FOR INSOMNIA. MAY TAKE 1/2 TABLET BY MOUTH DURING THE DAY FOR ANXIETY 02/13/20   Velna Ochs, MD  benzonatate (TESSALON) 100 MG capsule Take 1 capsule (100 mg total) by mouth 3 (three) times daily as needed for cough. 12/16/18   Bartholomew Crews, MD  betamethasone dipropionate 0.05 % cream Apply topically 2 (two) times daily. 05/31/20   Angelica Pou, MD  Blood Glucose Monitoring Suppl (ONETOUCH VERIO) W/DEVICE KIT 1 each by Does not apply route 4 (four) times daily. 07/25/14   [name], MD  buPROPion (WELLBUTRIN XL) 150 MG 24 hr tablet Take 1 tablet (150 mg total) by mouth every morning for 365 doses. 04/12/20 04/12/21   Angelica Pou, MD  Continuous Blood Gluc Sensor (FREESTYLE LIBRE 2 SENSOR) MISC 1 each by Does not apply route 6 (six) times daily. To read 6 times daily with change of sensor every 2 weeks, 90 days worth with 3 refills 02/14/20   Angelica Pou, MD  DULoxetine (CYMBALTA) 60 MG capsule Take 1 capsule (60 mg total) by mouth 2 (two) times daily. 07/04/20 08/03/20  Angelica Pou, MD  fluticasone (FLONASE) 50 MCG/ACT nasal spray Place 2 sprays into both nostrils daily. 02/13/20   Velna Ochs, MD  Fluticasone-Umeclidin-Vilant (TRELEGY ELLIPTA) 100-62.5-25 MCG/INH AEPB Inhale 1 puff into the lungs daily. 02/07/20   Baird Lyons D, MD  furosemide (LASIX) 80 MG tablet TAKE 1 TABLET BY MOUTH EVERY MORNING AND TAKE 1/2  TABLET EVERY EVENING. 04/23/20   [name], MD  gabapentin (NEURONTIN) 300 MG capsule Take 2 capsules (600 mg total) by mouth 3 (three) times daily. 06/01/20   Angelica Pou, MD  glucose blood [facility] VERIO) test strip Use to check blood sugar up to 7 times weekly. diag code E11.40. Insulin dependent 03/01/20   Angelica Pou, MD  HYDROcodone-acetaminophen (NORCO/VICODIN) 5-325 MG tablet Take 2 tablets by mouth 3 (three) times daily as needed for moderate pain or severe pain. 07/04/20   Angelica Pou, MD  Insulin Degludec-Liraglutide (XULTOPHY) 100-3.6 UNIT-MG/ML SOPN Inject 40 Units into the skin daily for 30 doses. 07/25/20 08/24/20  Angelica Pou, MD  Insulin Pen Needle 32G X 4 MM MISC Use to inject insulin up to 6 times a day 08/04/17   [name], MD  Insulin Syringe-Needle U-100 31G X 15/64" 0.5 ML MISC Use to inject insulin up to 4 times a day 04/17/16   [name], MD  ipratropium-albuterol (DUONEB) 0.5-2.5 (3) MG/3ML SOLN 3 ml as needed    [provider]  ipratropium-albuterol (DUONEB) 0.5-2.5 (3) MG/3ML SOLN Take 3 mLs by nebulization every 6 (six) hours as needed for up to 28 days. 04/17/20 05/15/20  Angelica Pou,  MD  Lancets Misc. (ACCU-CHEK FASTCLIX LANCET) KIT Check your blood 4 times a day dx code 250.00 insulin requiring 01/19/13   [name], MD  lidocaine (LIDODERM) 5 % Place 1 patch onto the skin every 12 (twelve) hours. Remove & Discard patch within 12 hours or as directed by MD 12/09/19 12/08/20  Angelica Pou, MD  metoprolol succinate (TOPROL-XL) 50 MG 24 hr tablet Take 1 tablet (50 mg total) by mouth daily. Take with or immediately following a meal. 04/12/19   Clegg, [name] D, NP  nystatin (NYSTATIN) powder Apply 1 application topically 3 (three) times daily as needed. 04/12/20   Angelica Pou, MD  omeprazole (PRILOSEC) 40 MG capsule Take 1 capsule (40 mg total) by mouth in the morning and at bedtime. 03/07/20 04/12/20  Angelica Pou, MD  OXYGEN Inhale 2 L into the lungs at bedtime.    [provider]  potassium chloride SA (KLOR-CON) 20 MEQ tablet Take 2 tabs by mouth every morning and 1 tab every evening to replace potassium lost from lasix use. 09/01/19   Angelica Pou, MD  PRESCRIPTION MEDICATION Inhale into the lungs at bedtime. CPAP    [provider]  rosuvastatin (CRESTOR) 20 MG tablet TAKE 1 TABLET BY MOUTH ONCE EVERY NIGHT AT BEDTIME 06/01/20   Angelica Pou, MD  terbinafine (LAMISIL) 1 % cream Apply cream to clean and dry feet twice a day until gone.  Apply to toes, and to sides, heels, and soles of feet. 12/09/19   Angelica Pou, MD  warfarin (COUMADIN) 2.5 MG tablet Take 2.31m warfarin on Thursday, Friday, Saturday and Sunday.  013mon Mondays; 1.2566m1/2 x 2.5mg56mblet) on Tuesday and Wednesday. 04/30/20   GrocPennie BanterH-CPP     Family History  Problem Relation Age of Onset   Peptic Ulcer Disease Father    Heart attack Father 56  32   Died of MI at age 81  66eart attack Brother 54  78   Died of MI at age 13  73besity Brother    Pneumonia Mother    Healthy Sister    Lupus Daughter    Obsessive Compulsive Disorder  Daughter  Social History   Socioeconomic History   Marital status: Divorced    Spouse name: Not on file   Number of children: Not on file   Years of education: Not on file   Highest education level: Not on file  Occupational History   Not on file  Tobacco Use   Smoking status: Every Day    Packs/day: 1.00    Years: 55.00    Pack years: 55.00    Types: Cigarettes    Start date: 11/01/1962   Smokeless tobacco: Never  Vaping Use   Vaping Use: Never used  Substance and Sexual Activity   Alcohol use: Not Currently    Comment: 02/19/2018 "nothing since 1999"   Drug use: Not Currently    Types: Cocaine, Marijuana    Comment: 02/19/2018 "nothing since the 1990s"   Sexual activity: Not Currently    Birth control/protection: Post-menopausal  Other Topics Concern   Not on file  Social History Narrative   Lives [place]   Worked at [employer] for 18 years   No car   Social Determinants of Radio broadcast assistant Strain: Not on [employer] Insecurity: No Food Insecurity   Worried About Charity fundraiser in the Last Year: Never true   Arboriculturist in the Last Year: Never true  Transportation Needs: No Transportation Needs   Lack of Transportation (Medical): No   Lack of Transportation (Non-Medical): No  Physical Activity: Not on file  Stress: Not on file  Social Connections: Not on file    ECOG Status: 0 - Asymptomatic  Review of Systems  Constitutional: Negative.   Respiratory: Negative.    Cardiovascular: Negative.   Genitourinary: Negative.   Musculoskeletal: Negative.   Neurological: Negative.    Review of Systems: A 12 point ROS discussed and pertinent positives are indicated in the HPI above.  All other systems are negative.  Physical Exam No direct physical exam was performed (except for noted visual exam findings with Video Visits).   Vital Signs: There were no vitals taken for this visit.  Imaging: No  results found.  Labs:  CBC: Recent Labs    12/08/19 1215 12/29/19 1231 04/12/20 1139 04/18/20 1555  WBC 6.8 8.9 7.0 7.6  HGB 11.3 12.3 10.0* 9.6*  HCT 35.4 39.0 32.5* 29.8*  PLT 203 244 224 216.0    COAGS: No results for input(s): INR, APTT in the last 8760 hours.  BMP: Recent Labs    09/01/19 1022 09/01/19 1022 09/26/19 1317 12/29/19 1231 01/09/20 1338 04/12/20 1139 04/18/20 1555  NA 136   < > 135 137 137 139 140  K 4.5  --  4.2 4.2 4.0 4.3 4.5  CL 94*  --  98 99 97* 99 100  CO2 26  --  _0 33*  GLUCOSE 159*   < > 276* 300* 172* 212* 193*  BUN 18   < > _1 CALCIUM 9.7  --  9.1 9.5 9.3 9.4 9.4  CREATININE 1.05*  --  1.11* 1.08* 1.23* 0.99 1.23*  GFRNONAA 54*  --  50* 55* 47*  --   --   GFRAA 62  --  58*  --   --   --   --    < > = values in this interval not displayed.    LIVER FUNCTION TESTS: Recent Labs    09/26/19 1317 04/18/20 1555  BILITOT 0.6  0.3  AST 15 10  ALT 12 6  ALKPHOS 60 45  PROT 6.8 6.7  ALBUMIN 3.7 3.8     Assessment and Plan:  I spoke with [name] by phone.  Recent MRI demonstrates an enlarging partially cystic 14 mm lesion at the lateral upper pole cortex of the left kidney demonstrating partial central enhancement and suspicious for a slowly growing renal carcinoma.  This is in the region of the previously seen 7 mm lesion in 2019 that was likely too small at that time to demonstrate internal enhancement by MRI.  The lesion is in a location and of small and of size to allow for treatment by percutaneous cryoablation.  Renal function is normal and she did quite well following prior ablation of a left lower pole carcinoma.  My only current concern is optimizing respiratory status prior to placing [name] under general anesthesia again.  I will ask Dr. Annamaria Boots if he would like to see her for pulmonary clearance prior to scheduling cryoablation of the left renal mass under general anesthesia.  She states that she  has an appointment with Dr. Annamaria Boots at the end of July.  It would be fine to keep that appointment and schedule the ablation procedure sometime in August.  Electronically Signed: Azzie Roup 07/31/2020, 3:04 PM    I spent a total of 15 Minutes in remote  clinical consultation, greater than 50% of which was counseling/coordinating care for a left renal mass.    Visit type: Audio only (telephone). Audio (no video) only due to patient's lack of internet/smartphone capability. Alternative for in-person consultation at [facility] [address] [place], [place], [place]. This visit type was conducted due to national recommendations for restrictions regarding the COVID-19 Pandemic (e.g. social distancing).  This format is felt to be most appropriate for this patient at this time.  All issues noted in this document were discussed and addressed.

## 2020-08-01 ENCOUNTER — Encounter: Payer: Self-pay | Admitting: *Deleted

## 2020-08-01 ENCOUNTER — Telehealth: Payer: Self-pay | Admitting: Internal Medicine

## 2020-08-01 ENCOUNTER — Ambulatory Visit: Payer: HMO | Admitting: Behavioral Health

## 2020-08-01 DIAGNOSIS — F331 Major depressive disorder, recurrent, moderate: Secondary | ICD-10-CM

## 2020-08-01 DIAGNOSIS — F419 Anxiety disorder, unspecified: Secondary | ICD-10-CM

## 2020-08-01 NOTE — Telephone Encounter (Signed)
Called and spoke with Vicky, [place].  Olegario Shearer stated Patient is having renal crynoablation in August.  [name] requested surgical clearance with Dr. Annamaria Boots at 09/06/20 OV. I have added surgical clearance requested in OV note.  Nothing further at this time.

## 2020-08-01 NOTE — BH Specialist Note (Signed)
Integrated Behavioral Health via Telemedicine Visit  08/01/2020 [name] [id number]  Number of Integrated Behavioral Health visits: 9 Session Start time: 11:00am  Session End time: 11:30am Total time: 30  Referring Provider: Dr. Dorian Pod, MD Patient/Family location: Pt @ home in private [facility] Provider location: [facility] Office All persons participating in visit: Pt & Clinician Types of Service: Health Promotion  I connected with [name] and/or [name]'s  self  via  Telephone or Video Enabled Telemedicine Application  (Video is Caregility application) and verified that I am speaking with the correct person using two identifiers. Discussed confidentiality:  n/a  I discussed the limitations of telemedicine and the availability of in person appointments.  Discussed there is a possibility of technology failure and discussed alternative modes of communication if that failure occurs.  I discussed that engaging in this telemedicine visit, they consent to the provision of behavioral healthcare and the services will be billed under their insurance.  Patient and/or legal guardian expressed understanding and consented to Telemedicine visit:  n/a  Presenting Concerns: Patient and/or family reports the following symptoms/concerns: elevated anx due to upcoming procedure to freeze L Kidney lesion Duration of problem: months; Severity of problem: moderate  Patient and/or Family's Strengths/Protective Factors: Social and Emotional competence and Concrete supports in place (healthy food, safe environments, etc.)  Goals Addressed: Patient will:  Reduce symptoms of: anxiety, depression, and loneliness    Increase knowledge and/or ability of: coping skills and healthy habits   Demonstrate ability to: Increase healthy adjustment to current life circumstances and Increase adequate support systems for patient/family  Progress towards Goals: Ongoing; Pt took 62  steps to her trash & mailbox! Pt was sore for 2 days-  Interventions: Interventions utilized:  Solution-Focused Strategies and Supportive Counseling Standardized Assessments completed:  screeners prn  Patient and/or Family Response: Pt receptive to call today & wants future f/u calls  Assessment: Patient currently experiencing concern for scheduling of procedure & for Family dynamics.   Patient may benefit from cont'd supportive f/u calls.  Plan: Follow up with behavioral health clinician on : 2-3 wks on telehealth for 30 min Behavioral recommendations: None @ this time Referral(s): [place] (In Clinic) and [place] for contact calls  I discussed the assessment and treatment plan with the patient and/or parent/guardian. They were provided an opportunity to ask questions and all were answered. They agreed with the plan and demonstrated an understanding of the instructions.   They were advised to call back or seek an in-person evaluation if the symptoms worsen or if the condition fails to improve as anticipated.  Donnetta Hutching, LMFT

## 2020-08-02 ENCOUNTER — Ambulatory Visit: Payer: HMO | Admitting: Dietician

## 2020-08-02 ENCOUNTER — Ambulatory Visit (INDEPENDENT_AMBULATORY_CARE_PROVIDER_SITE_OTHER): Payer: HMO | Admitting: Internal Medicine

## 2020-08-02 ENCOUNTER — Other Ambulatory Visit: Payer: Self-pay

## 2020-08-02 VITALS — BP 132/37 | HR 65 | Temp 98.2°F | Ht 62.0 in | Wt 167.3 lb

## 2020-08-02 DIAGNOSIS — I5032 Chronic diastolic (congestive) heart failure: Secondary | ICD-10-CM | POA: Diagnosis not present

## 2020-08-02 DIAGNOSIS — J449 Chronic obstructive pulmonary disease, unspecified: Secondary | ICD-10-CM

## 2020-08-02 DIAGNOSIS — G479 Sleep disorder, unspecified: Secondary | ICD-10-CM

## 2020-08-02 DIAGNOSIS — Z0000 Encounter for general adult medical examination without abnormal findings: Secondary | ICD-10-CM

## 2020-08-02 DIAGNOSIS — F321 Major depressive disorder, single episode, moderate: Secondary | ICD-10-CM

## 2020-08-02 DIAGNOSIS — R918 Other nonspecific abnormal finding of lung field: Secondary | ICD-10-CM

## 2020-08-02 DIAGNOSIS — I130 Hypertensive heart and chronic kidney disease with heart failure and stage 1 through stage 4 chronic kidney disease, or unspecified chronic kidney disease: Secondary | ICD-10-CM | POA: Diagnosis not present

## 2020-08-02 DIAGNOSIS — M79674 Pain in right toe(s): Secondary | ICD-10-CM

## 2020-08-02 DIAGNOSIS — B353 Tinea pedis: Secondary | ICD-10-CM

## 2020-08-02 DIAGNOSIS — M5441 Lumbago with sciatica, right side: Secondary | ICD-10-CM

## 2020-08-02 DIAGNOSIS — Z794 Long term (current) use of insulin: Secondary | ICD-10-CM

## 2020-08-02 DIAGNOSIS — I10 Essential (primary) hypertension: Secondary | ICD-10-CM

## 2020-08-02 DIAGNOSIS — E669 Obesity, unspecified: Secondary | ICD-10-CM

## 2020-08-02 DIAGNOSIS — I739 Peripheral vascular disease, unspecified: Secondary | ICD-10-CM | POA: Diagnosis not present

## 2020-08-02 DIAGNOSIS — N183 Chronic kidney disease, stage 3 unspecified: Secondary | ICD-10-CM

## 2020-08-02 DIAGNOSIS — N289 Disorder of kidney and ureter, unspecified: Secondary | ICD-10-CM

## 2020-08-02 DIAGNOSIS — D509 Iron deficiency anemia, unspecified: Secondary | ICD-10-CM

## 2020-08-02 DIAGNOSIS — M79675 Pain in left toe(s): Secondary | ICD-10-CM

## 2020-08-02 DIAGNOSIS — I480 Paroxysmal atrial fibrillation: Secondary | ICD-10-CM | POA: Diagnosis not present

## 2020-08-02 DIAGNOSIS — J439 Emphysema, unspecified: Secondary | ICD-10-CM

## 2020-08-02 DIAGNOSIS — E1140 Type 2 diabetes mellitus with diabetic neuropathy, unspecified: Secondary | ICD-10-CM | POA: Diagnosis not present

## 2020-08-02 DIAGNOSIS — B351 Tinea unguium: Secondary | ICD-10-CM | POA: Diagnosis not present

## 2020-08-02 DIAGNOSIS — G8929 Other chronic pain: Secondary | ICD-10-CM

## 2020-08-02 DIAGNOSIS — M5442 Lumbago with sciatica, left side: Secondary | ICD-10-CM

## 2020-08-02 LAB — POCT GLYCOSYLATED HEMOGLOBIN (HGB A1C): Hemoglobin A1C: 7.1 % — AB (ref 4.0–5.6)

## 2020-08-02 LAB — GLUCOSE, CAPILLARY: Glucose-Capillary: 219 mg/dL — ABNORMAL HIGH (ref 70–99)

## 2020-08-02 MED ORDER — BENZONATATE 100 MG PO CAPS: 100.0000 mg | ORAL_CAPSULE | Freq: Three times a day (TID) | ORAL | 11 refills | Status: DC | PRN

## 2020-08-02 NOTE — Progress Notes (Signed)
[name] presents for routine follow-up.  Since last visit, she has undergone imaging revealing a recurrence of her former renal cell carcinoma, which was successfully cryoablated in 2013.  While asymptomatic, a lesion appeared incidentally on a study performed for other reasons.  A repeat cryoablation is anticipated this summer, though interventionallist [name] is requesting pulmonary risk assessment from Dr. Annamaria Boots prior to moving forward.  [name] reports that she is doing as well as can be expected.  She brings her list of concerns and requests-  *Request a hard copy of Tessalon Perles so that she can shop for low prices. *Requests a prescription for the shingles vaccine. *Request that her opioid be prescribed to [facility] rather than the delivery company. *Inquires whether an OTC antifungal spray recommended by the pharmacist would be helpful for her tinea pedis (she cannot reach her feet to apply terbinafine prescribed some months ago-I will discontinue it from her prescription list).  Notes that she has been experience significant itching of the skin of her feet and legs, though the burning neuropathy has improved to some degree.  She feels that she may be losing the mobility in her ankles-she cannot tell if this is due to stiff joints or muscle weakness.  No recent breathing difficulties, significant cough, or shortness of breath.  No chest pain.  No lower extremity edema.  No joint pain.  No flank pain.  No bleeding or melena.  BP (!) 132/37 (BP Location: Right Arm, Patient Position: Sitting, Cuff Size: Small)   Pulse 65   Temp 98.2 F (36.8 C) (Oral)   Ht 5' 2" (1.575 m)   Wt 167 lb 4.8 oz (75.9 kg)   SpO2 95%   BMI 30.60 kg/m   [name] looks nice today, neatly dressed and groomed "I try to put in some effort when I go to the doctor", laughs.  Skin appears pale, hands and feet warm, the usual duskiness of the feet is absent today and present only at the toes.  Feet  with ashy fine scale.  Toenails are long and sharp, reduced today in the clinic.  She is tremulous primarily with action, both arms. No respiratory distress, no cough during the encounter.  Her voice typically has a mildly hoarse quality as it does today. No JVD. No LE edema.  Assessment and plan: See problem based charting also.  [name] will need preop labs to assess her anemia and her renal function and electrolytes.  Her pulmonary function appears to be at baseline and she will have a more detailed assessment at an upcoming visit with pulmonologist Dr. Annamaria Boots.  While she is worried about the upcoming ablation, I reminded her that she tolerated it well during her first treatment which was successful in holding the cancer at [place] for several years.

## 2020-08-02 NOTE — Progress Notes (Signed)
Supportive visit with patient. Gave her ideas for easy, no to little cooking needed balanced meals. Encouraged adequate protein, healthy fats. Coupons and samples for nutritional supplements provided. Continuous glucose monitoring report briefly reviewed with patient. Follow up as needed.  [name], RD 08/02/2020 4:55 PM.

## 2020-08-03 ENCOUNTER — Telehealth: Payer: Self-pay | Admitting: Internal Medicine

## 2020-08-03 LAB — CBC
Hematocrit: 27.4 % — ABNORMAL LOW (ref 34.0–46.6)
Hemoglobin: 7.8 g/dL — ABNORMAL LOW (ref 11.1–15.9)
MCH: 22.1 pg — ABNORMAL LOW (ref 26.6–33.0)
MCHC: 28.5 g/dL — ABNORMAL LOW (ref 31.5–35.7)
MCV: 78 fL — ABNORMAL LOW (ref 79–97)
Platelets: 180 10*3/uL (ref 150–450)
RBC: 3.53 x10E6/uL — ABNORMAL LOW (ref 3.77–5.28)
RDW: 17.1 % — ABNORMAL HIGH (ref 11.7–15.4)
WBC: 7 10*3/uL (ref 3.4–10.8)

## 2020-08-03 LAB — BMP8+ANION GAP
Anion Gap: 13 mmol/L (ref 10.0–18.0)
BUN/Creatinine Ratio: 16 (ref 12–28)
BUN: 17 mg/dL (ref 8–27)
CO2: 24 mmol/L (ref 20–29)
Calcium: 9.4 mg/dL (ref 8.7–10.3)
Chloride: 101 mmol/L (ref 96–106)
Creatinine, Ser: 1.05 mg/dL — ABNORMAL HIGH (ref 0.57–1.00)
Glucose: 188 mg/dL — ABNORMAL HIGH (ref 65–99)
Potassium: 4.7 mmol/L (ref 3.5–5.2)
Sodium: 138 mmol/L (ref 134–144)
eGFR: 57 mL/min/{1.73_m2} — ABNORMAL LOW (ref >59)

## 2020-08-03 NOTE — Telephone Encounter (Signed)
Labs from yesterday resulted with Hg 7.8 down from 9.6 52M ago. Recent EGD and colonoscopy. Asymptomatic.  Called to discuss with [name] and left a generic vm; I will call again on Monday.  Will likely transfuse as outpatient next week as she has an upcoming cryoablation surgery under general anesthesia for recurrence of renal ca.

## 2020-08-06 ENCOUNTER — Telehealth: Payer: Self-pay

## 2020-08-06 ENCOUNTER — Telehealth: Payer: Self-pay | Admitting: Pharmacist

## 2020-08-06 NOTE — Telephone Encounter (Signed)
Requesting lab results, please call back.

## 2020-08-06 NOTE — Telephone Encounter (Signed)
Patient texted results of PST FS POC INR = 1.9 (target 1.5 - 2.5) on 69m/M; 1x2.554mTu/Th/Fr/Sa/Su; 1/2 x 2.51m14m1.251m97mn Wed. CONTINUE same regimen. Repeat INR 4-JUL-22. NO bleeding reported.

## 2020-08-06 NOTE — Progress Notes (Addendum)
PCP: Angelica Pou, MD HF Cardiology: Dr. Aundra Dubin  70 y.o. with COPD, mitral stenosis s/p bioprosthetic MV replacement, paroxysmal atrial fibrillation, GI bleeding, and pulmonary hypertension/RV failure presents for cardiology followup.  She was admitted with GI bleeding, thought to be AVMs, in 2/18 and warfarin was stopped.  She was admitted in 3/18 with chest pain, dyspnea, and elevated troponin.  She had not been taking her Lasix regularly.  Coronary angiography showed nonobstructive CAD, suspect demand ischemia. RHC showed elevated right and left heart filling pressures and severe pulmonary hypertension.  V/Q scan showed no chronic PE, and PFTs showed severe obstructive lung disease.  Echo showed severe RV dilation with mildly decreased systolic function.  She was diuresed and eventually discharged to [facility].   She was admitted in 2/19 with anemia, got 2 units PRBCs.    Echo in 5/19 with EF 60-65%, mildly decreased RV systolic function, PASP 49 mmHg, normal bioprosthetic mitral valve. PYP scan (4/21) grade 2, H/CL 1.3 (equivocal).  PYP scan repeated 10/21, grade 1 with H/CL 1.04. Echo in 12/21 with EF 60-65%, mild LVH, normal RV, PASP 35 mmHg, s/p bioprosthetic mitral valve with mean gradient 4, IVC normal.   She returned 3/22 for followup of CHF.  Still smoking 1 ppd.  SOB walking longer distances, weight was up 4 lbs.  Struggling with bilateral leg neuropathic pain.  Today she returns for HF follow up. Overall feeling fatigued. Breathing better with switching inhalers recently. SOB with activity around the house but this is her baseline. Denies increasing SOB, CP, dizziness, edema, or PND/Orthopnea. Appetite ok. No fever or chills. Weight at home 170 pounds. Taking all medications. She has atypical right-sided chest pain (occasional episodes at night).  Still with bilateral leg neuropathic pain. Wears CPAP at night + oxygen. She has had a recurrence of her kidney cancer, plans for cryo  under general anesthesia.  Labs (3/18): K 4.8 => 3.9, creatinine 1.2 => 1.09, hgb 10.2 => 10.7, BNP 131 Labs (5/18): K 4, creatinine 1.0 Labs (2/19): K 4.5, creatinine 0.94, transferrin saturation 3%.  Labs (3/19): hgb 11.3 Labs (4/19): K 4.1, creatinine 1.0, LDL 36 Labs (1/10): creatinine 0.85 Labs (7/20): hgb 10.7  Labs (10/20): K 4, creatinine 1.13, LDL 48 Labs (11/20): hgb 9.2 Labs (1/21): K 4.2, creatinine 1.28 Labs (2/21): hgb 10.7 Labs (8/21): LDL 46, HDL 35, K 4.2, creatinine 1.11 Labs (3/22): LDL 51, TGs 171, K 4.3, creatinine 0.99 Labs (6/22): K 4.7, creatinine 1.05, hgb 7.8  ECG (personally reviewed): NSR, 1st degree AVB 224 msec  PMH: 1. Mitral stenosis: s/p bioprosthetic mitral valve replacement.  2. COPD: Severe by 3/18 PFTs.  She is still smoking.  3. Atrial fibrillation: Paroxysmal. Has been off warfarin since GI bleed in 2/18.  4. OSA: Uses CPAP.  5. Type II diabetes. 6. Pulmonary hypertension/RV failure: Suspect primarily group 3 PH due to underlying lung disease (COPD).  - RHC (3/18) with mean RA 14, PA 77/28 mean 47, mean PCWP 23, CI 2.6, PVR 4.9.  - Echo (3/18): EF 60-65%, severe RV dilation with mildly decreased RV systolic function, PASP 76 mmHg.  Bioprosthetic MV looks ok.  - V/Q scan (3/18): No evidence for chronic PE.  - PFTs (3/18): Severe obstructive airways disease.  - Echo in 5/19 with EF 60-65%, mildly decreased RV systolic function, PASP 49 mmHg, normal bioprosthetic mitral valve.  - PYP scan (10/21) with grade 1, H/CL 1.04 => unlikely TTR amyloidosis.  - Echo (12/21): EF 60-65%, mild  LVH, normal RV, PASP 35 mmHg, s/p bioprosthetic mitral valve with mean gradient 4, IVC normal 7. Anemia: Fe deficiency and anemia of chronic disease.   - Suspect GI AVMs as cause of 2/18 bleed . Coumadin stopped.  8. CAD: LHC (3/18) with 30% LM, 40% proximal RCA.  9. Hyperlipidemia 10. PAD: ABIs 3/21 with 0.85 R, 1.03 L. 50-74% right SFA stenosis, 30-49% left SFA  stenosis.  - ABIs (12/21): 0.74 right, 0.99 left.  11. Carotid stenosis: Carotid dopplers (3/21) with 40-59% BICA.   SH: Lives alone in [place].  Active smoker.  No ETOH.   Family History  Problem Relation Age of Onset   Peptic Ulcer Disease Father    Heart attack Father 57       Died of MI at age 61   Heart attack Brother 30       Died of MI at age 34   Obesity Brother    Pneumonia Mother    Healthy Sister    Lupus Daughter    Obsessive Compulsive Disorder Daughter    ROS: All systems reviewed and negative except as per HPI.   Current Outpatient Medications  Medication Sig Dispense Refill   albuterol (PROAIR HFA) 108 (90 Base) MCG/ACT inhaler Inhale 2 puffs into the lungs every 6 (six) hours as needed for shortness of breath. 18 g 5   ALPRAZolam (XANAX) 1 MG tablet TAKE 1 TABLET BY MOUTH EVERY NIGHT AT BEDTIME AS NEEDED FOR INSOMNIA. MAY TAKE 1/2 TABLET BY MOUTH DURING THE DAY FOR ANXIETY 55 tablet 4   benzonatate (TESSALON) 100 MG capsule Take 1 capsule (100 mg total) by mouth 3 (three) times daily as needed for cough. 90 capsule 11   betamethasone dipropionate 0.05 % cream Apply topically 2 (two) times daily. 30 g 3   Blood Glucose Monitoring Suppl (ONETOUCH VERIO) W/DEVICE KIT 1 each by Does not apply route 4 (four) times daily. 1 kit 0   buPROPion (WELLBUTRIN XL) 150 MG 24 hr tablet Take 1 tablet (150 mg total) by mouth every morning for 365 doses. 90 tablet 3   Continuous Blood Gluc Sensor (FREESTYLE LIBRE 2 SENSOR) MISC 1 each by Does not apply route 6 (six) times daily. To read 6 times daily with change of sensor every 2 weeks, 90 days worth with 3 refills 7 each 3   fluticasone (FLONASE) 50 MCG/ACT nasal spray Place 2 sprays into both nostrils daily. 54 g 3   Fluticasone-Umeclidin-Vilant (TRELEGY ELLIPTA) 100-62.5-25 MCG/INH AEPB Inhale 1 puff into the lungs daily. 60 each 6   furosemide (LASIX) 80 MG tablet TAKE 1 TABLET BY MOUTH EVERY MORNING AND TAKE 1/2 TABLET EVERY  EVENING. 45 tablet 4   gabapentin (NEURONTIN) 300 MG capsule Take 2 capsules (600 mg total) by mouth 3 (three) times daily. 540 capsule 3   glucose blood (ONETOUCH VERIO) test strip Use to check blood sugar up to 7 times weekly. diag code E11.40. Insulin dependent 25 each 8   HYDROcodone-acetaminophen (NORCO/VICODIN) 5-325 MG tablet Take 2 tablets by mouth 3 (three) times daily as needed for moderate pain or severe pain. 120 tablet 0   Insulin Degludec-Liraglutide (XULTOPHY) 100-3.6 UNIT-MG/ML SOPN Inject 40 Units into the skin daily for 30 doses. 3 mL 3   Insulin Pen Needle 32G X 4 MM MISC Use to inject insulin up to 6 times a day 500 each 3   Insulin Syringe-Needle U-100 31G X 15/64" 0.5 ML MISC Use to inject insulin up to 4 times  a day 400 each 3   ipratropium-albuterol (DUONEB) 0.5-2.5 (3) MG/3ML SOLN 3 ml as needed     Lancets Misc. (ACCU-CHEK FASTCLIX LANCET) KIT Check your blood 4 times a day dx code 250.00 insulin requiring 1 kit 2   lidocaine (LIDODERM) 5 % Place 1 patch onto the skin every 12 (twelve) hours. Remove & Discard patch within 12 hours or as directed by MD 60 patch 2   metoprolol succinate (TOPROL-XL) 50 MG 24 hr tablet Take 1 tablet (50 mg total) by mouth daily. Take with or immediately following a meal. 90 tablet 3   nystatin (NYSTATIN) powder Apply 1 application topically 3 (three) times daily as needed. 60 g 4   OXYGEN Inhale 2 L into the lungs at bedtime.     potassium chloride SA (KLOR-CON) 20 MEQ tablet Take 2 tabs by mouth every morning and 1 tab every evening to replace potassium lost from lasix use. 270 tablet 3   PRESCRIPTION MEDICATION Inhale into the lungs at bedtime. CPAP     rosuvastatin (CRESTOR) 20 MG tablet TAKE 1 TABLET BY MOUTH ONCE EVERY NIGHT AT BEDTIME 90 tablet 3   terbinafine (LAMISIL) 1 % cream Apply cream to clean and dry feet twice a day until gone.  Apply to toes, and to sides, heels, and soles of feet. 30 g 1   warfarin (COUMADIN) 2.5 MG tablet Take  2.71m warfarin on Thursday, Friday, Saturday and Sunday.  023mon Mondays; 1.2585m1/2 x 2.5mg17mblet) on Tuesday and Wednesday. 20 tablet 2   DULoxetine (CYMBALTA) 60 MG capsule Take 1 capsule (60 mg total) by mouth 2 (two) times daily. 60 capsule 3   ipratropium-albuterol (DUONEB) 0.5-2.5 (3) MG/3ML SOLN Take 3 mLs by nebulization every 6 (six) hours as needed for up to 28 days. 24 mL 3   omeprazole (PRILOSEC) 40 MG capsule Take 1 capsule (40 mg total) by mouth in the morning and at bedtime. 180 capsule 1   No current facility-administered medications for this encounter.   Wt Readings from Last 3 Encounters:  08/07/20 74.8 kg (165 lb)  08/02/20 75.9 kg (167 lb 4.8 oz)  05/31/20 76 kg (167 lb 8 oz)   BP (!) 142/57   Pulse (!) 59   Wt 74.8 kg (165 lb)   SpO2 90%   BMI 30.18 kg/m  General:  NAD. No resp difficulty, in WC  HEENT: Normal Neck: Supple. No JVD. Carotids 2+ bilat; no bruits. No lymphadenopathy or thryomegaly appreciated. Cor: PMI nondisplaced. Regular rate & rhythm. No rubs, gallops or murmurs. Lungs: Clear, diminished in bases Abdomen: Obese, soft, nontender, nondistended. No hepatosplenomegaly. No bruits or masses. Good bowel sounds. Extremities: No cyanosis, clubbing, rash, trace LE edema, venous stasis changes to lower extremities, onychomycotic toenails. Neuro: Alert & oriented x 3, cranial nerves grossly intact. Moves all 4 extremities w/o difficulty. Affect pleasant.  Assessment/Plan: 1. Pulmonary hypertension: I suspect that this is primarily group 2 PH (pulmonary venous hypertension from LV diastolic dysfunction) and group 3 PH (severe COPD, OSA).  V/Q scan showed no evidence for chronic PE.  I do not think that she will benefit from selective pulmonary vasodilators.  I think that the key here will continue to be good diuresis + use of oxygen and CPAP.  2. Chronic diastolic CHF with prominent RV dysfunction in the setting of severe COPD.  PYP scan in 10/21 is NOT  suggestive of TTR amyloidosis.  Echo in 12/21 with EF 65-70%, normal RV.  NYHA  class III symptoms (dyspnea is likely a combination of CHF and COPD), stable recently.  She does not look volume overloaded.  - She stopped Jardiance with a severe yeast infection.    - Continue Lasix 80 qam/40 qpm.  BMET today.   3. Atrial fibrillation: Paroxysmal.  She is in NSR today.  She had been off anticoagulation due to GI bleeding, now back on warfarin with INR goal 1.5-2.5.  She had GI bleeding in 2/19, no recent melena.    - Keep warfarin with INR 1.5-2.5 for now.  - With most recent data regarding NOACs and bioprosthetic valves, I suggested in the past that she switch to apixaban given GI bleeding history. She has wanted to stay on warfarin.  4. h/o GI bleeding: Possible GI AVM. Admitted with anemia again in 2/19, got 2 units PRBCs.  No overt bleeding now.  However, Hgb now 7.8, managed by PCP. 5. COPD: Severe by PFTs. Unfortunately, she is still smoking (now less than 1 ppd).  I encouraged her to quit again.  6. CAD: Nonobstructive. She is on a statin. Good lipids in 3/22.  7. PAD: Hard to separate diabetic neuropathy pain from PAD pain. ABIs mildly worse on right in 12/21.  - Needs to quit smoking.  - Continue statin and warfarin.  8. Renal carcinoma: Recent MRI with 14 mm lesion on left kidney, previously 7 mm in 2019. - Plan for cryoablation per Interventional radiologist; general anesthesia. 9. Anemia: Found to have Hgb 7.8 at PCP appointment.  No overt bleeding. She has received iron infusions in the past. - Per PCP, will transfuse this week.  Followup in 4 months with Dr. Aundra Dubin.   [place], [place] 08/07/2020

## 2020-08-07 ENCOUNTER — Telehealth: Payer: Self-pay | Admitting: Internal Medicine

## 2020-08-07 ENCOUNTER — Telehealth: Payer: Self-pay | Admitting: *Deleted

## 2020-08-07 ENCOUNTER — Encounter (HOSPITAL_COMMUNITY): Payer: Self-pay

## 2020-08-07 ENCOUNTER — Encounter: Payer: Self-pay | Admitting: Internal Medicine

## 2020-08-07 ENCOUNTER — Other Ambulatory Visit: Payer: Self-pay

## 2020-08-07 ENCOUNTER — Ambulatory Visit (HOSPITAL_COMMUNITY)
Admission: RE | Admit: 2020-08-07 | Discharge: 2020-08-07 | Disposition: A | Discharge status: Home / Self Care | Payer: HMO | Source: Ambulatory Visit | Attending: Family Medicine | Admitting: Family Medicine

## 2020-08-07 VITALS — BP 142/57 | HR 59 | Wt 165.0 lb

## 2020-08-07 DIAGNOSIS — Z7901 Long term (current) use of anticoagulants: Secondary | ICD-10-CM | POA: Insufficient documentation

## 2020-08-07 DIAGNOSIS — I272 Pulmonary hypertension, unspecified: Secondary | ICD-10-CM | POA: Diagnosis not present

## 2020-08-07 DIAGNOSIS — E1140 Type 2 diabetes mellitus with diabetic neuropathy, unspecified: Secondary | ICD-10-CM | POA: Insufficient documentation

## 2020-08-07 DIAGNOSIS — I480 Paroxysmal atrial fibrillation: Secondary | ICD-10-CM | POA: Diagnosis not present

## 2020-08-07 DIAGNOSIS — Z85528 Personal history of other malignant neoplasm of kidney: Secondary | ICD-10-CM | POA: Diagnosis not present

## 2020-08-07 DIAGNOSIS — Z9981 Dependence on supplemental oxygen: Secondary | ICD-10-CM | POA: Diagnosis not present

## 2020-08-07 DIAGNOSIS — Z79899 Other long term (current) drug therapy: Secondary | ICD-10-CM | POA: Insufficient documentation

## 2020-08-07 DIAGNOSIS — Z8249 Family history of ischemic heart disease and other diseases of the circulatory system: Secondary | ICD-10-CM | POA: Insufficient documentation

## 2020-08-07 DIAGNOSIS — J449 Chronic obstructive pulmonary disease, unspecified: Secondary | ICD-10-CM

## 2020-08-07 DIAGNOSIS — Z794 Long term (current) use of insulin: Secondary | ICD-10-CM | POA: Diagnosis not present

## 2020-08-07 DIAGNOSIS — G4733 Obstructive sleep apnea (adult) (pediatric): Secondary | ICD-10-CM | POA: Insufficient documentation

## 2020-08-07 DIAGNOSIS — C642 Malignant neoplasm of left kidney, except renal pelvis: Secondary | ICD-10-CM | POA: Diagnosis not present

## 2020-08-07 DIAGNOSIS — I2723 Pulmonary hypertension due to lung diseases and hypoxia: Secondary | ICD-10-CM

## 2020-08-07 DIAGNOSIS — E1151 Type 2 diabetes mellitus with diabetic peripheral angiopathy without gangrene: Secondary | ICD-10-CM | POA: Insufficient documentation

## 2020-08-07 DIAGNOSIS — I739 Peripheral vascular disease, unspecified: Secondary | ICD-10-CM

## 2020-08-07 DIAGNOSIS — I2510 Atherosclerotic heart disease of native coronary artery without angina pectoris: Secondary | ICD-10-CM | POA: Diagnosis not present

## 2020-08-07 DIAGNOSIS — Z953 Presence of xenogenic heart valve: Secondary | ICD-10-CM | POA: Insufficient documentation

## 2020-08-07 DIAGNOSIS — I5032 Chronic diastolic (congestive) heart failure: Secondary | ICD-10-CM | POA: Diagnosis not present

## 2020-08-07 DIAGNOSIS — F17200 Nicotine dependence, unspecified, uncomplicated: Secondary | ICD-10-CM | POA: Diagnosis not present

## 2020-08-07 DIAGNOSIS — D649 Anemia, unspecified: Secondary | ICD-10-CM

## 2020-08-07 DIAGNOSIS — B353 Tinea pedis: Secondary | ICD-10-CM | POA: Insufficient documentation

## 2020-08-07 LAB — BASIC METABOLIC PANEL
Anion gap: 13 (ref 5–15)
BUN: 21 mg/dL (ref 8–23)
CO2: 26 mmol/L (ref 22–32)
Calcium: 9.3 mg/dL (ref 8.9–10.3)
Chloride: 97 mmol/L — ABNORMAL LOW (ref 98–111)
Creatinine, Ser: 1.09 mg/dL — ABNORMAL HIGH (ref 0.44–1.00)
GFR, Estimated: 54 mL/min — ABNORMAL LOW (ref >60)
Glucose, Bld: 197 mg/dL — ABNORMAL HIGH (ref 70–99)
Potassium: 4 mmol/L (ref 3.5–5.1)
Sodium: 136 mmol/L (ref 135–145)

## 2020-08-07 MED ORDER — HYDROCODONE-ACETAMINOPHEN 5-325 MG PO TABS: 2.0000 | ORAL_TABLET | Freq: Three times a day (TID) | ORAL | 0 refills | Status: DC | PRN

## 2020-08-07 NOTE — Assessment & Plan Note (Signed)
Toenails trimmed this visit.

## 2020-08-07 NOTE — Assessment & Plan Note (Signed)
A1c improved at 7.1. Dietary indiscretions.  No changes today.

## 2020-08-07 NOTE — Assessment & Plan Note (Signed)
Lesion is likely recurrent RCC which will be cryoablated by [name] in near future.  Has been evaluated by urologist.  Asymptomatic.

## 2020-08-07 NOTE — Assessment & Plan Note (Signed)
Steady weight loss over the past 2 years.  [name] is contributing.

## 2020-08-07 NOTE — Assessment & Plan Note (Signed)
>>  ASSESSMENT AND PLAN FOR CHRONIC CHF (CONGESTIVE HEART FAILURE) (HCC) WRITTEN ON 08/07/2020  4:22 PM BY Miguel Aschoff, MD  Asymptomatic, no LE edema clinically euvolemic. CHF clinic appt next week.

## 2020-08-07 NOTE — Telephone Encounter (Signed)
Spoke to [name] by phone to discuss her lab results from last week.  I had left her a vm mssg on 6/24 but without the details.  Through EMR she is aware of her hg results.  Again on discussion she reports no bleeding or melena.  She feels more tired than usual and while desiring sleep, can only remain asleep for about 2 hrs.  No lightheaded feeling when standing.  She reaffirms a many year history of transfusion-requiring anemia without source.  Plan is for repeat iron studies, blood transfusion particularly in anticipation of upcoming cryoablation procedure for her recurrent renal cancer, and perhaps an iron infusion if needed.  Transportation is challenging.  I'll explore an observation admission to expedite management.

## 2020-08-07 NOTE — Patient Instructions (Signed)
It was great to see you today! No medication changes are needed at this time.   Labs today We will only contact you if something comes back abnormal or we need to make some changes. Otherwise no news is good news!  At the [facility], you and your health needs are our priority. As part of our continuing mission to provide you with exceptional heart care, we have created designated Provider Care Teams. These Care Teams include your primary Cardiologist (physician) and Advanced Practice Providers (APPs- Physician Assistants and Nurse Practitioners) who all work together to provide you with the care you need, when you need it.   You may see any of the following providers on your designated Care Team at your next follow up: Dr Glori Bickers Dr Loralie Champagne Dr Patrice Paradise, NP Lyda Jester, [place] Ginnie Smart Audry Riles, PharmD   Please be sure to bring in all your medications bottles to every appointment.   Do the following things EVERYDAY: Weigh yourself in the morning before breakfast. Write it down and keep it in a log. Take your medicines as prescribed Eat low salt foods--Limit salt (sodium) to 2000 mg per day.  Stay as active as you can everyday Limit all fluids for the day to less than 2 liters

## 2020-08-07 NOTE — Assessment & Plan Note (Signed)
No sxs of claudication at this time.  See last visit entry. Once her RCC is addressed, we can readdress a f/u visit with interventional cardiologist Dr. Gwenlyn Found.

## 2020-08-07 NOTE — Assessment & Plan Note (Signed)
Continues to report napping around the clock, not able to sustain sleep more than 2 hrs per her report.

## 2020-08-07 NOTE — Assessment & Plan Note (Signed)
Undergoing talk therapy which is helpful.  Loneliness and loss of health are key drivers.  Mood is stable today.

## 2020-08-07 NOTE — Assessment & Plan Note (Signed)
Asymptomatic, no LE edema clinically euvolemic. CHF clinic appt next week.

## 2020-08-07 NOTE — Assessment & Plan Note (Signed)
She requests prescription for Zostrix.

## 2020-08-07 NOTE — Assessment & Plan Note (Signed)
Check CBC today - no bleeding reported.

## 2020-08-07 NOTE — Assessment & Plan Note (Signed)
CKD stage 3a; repeat labs today; 55M ago had a bump in creatitine.

## 2020-08-07 NOTE — Assessment & Plan Note (Signed)
Adequate control; has CHF clinic visit next week as well.  No changes for now.

## 2020-08-07 NOTE — Telephone Encounter (Signed)
Received fax from [facility] with INR results from 03/26/20 to 08/06/20. Last INR 1.9 on 08/06/20. Results placed in Dr. Gladstone Pih office.

## 2020-08-07 NOTE — Patient Instructions (Signed)
[name], [name] to see you finally! No changes in your medications at this time.  Your diabetes control has improved and blood pressure looks good.  We are checking your blood counts, electrolytes and kidney function today.  Take care and stay well, Dr. [name]

## 2020-08-07 NOTE — Assessment & Plan Note (Signed)
Auscultates regular at today's visit.  NO palpitations.  On chronic anticoagulation.

## 2020-08-07 NOTE — Assessment & Plan Note (Signed)
F/u chest CT for cancer screening identified no concerning nodules or masses.

## 2020-08-07 NOTE — Assessment & Plan Note (Signed)
Trelegy continues to be beneficial.  Dr. Annamaria Boots appt upcoming to for preop risk assessment.

## 2020-08-14 ENCOUNTER — Encounter: Payer: Self-pay | Admitting: *Deleted

## 2020-08-14 NOTE — Telephone Encounter (Signed)
Patient called in wanting to know status of obs admission for blood transfusion so she can have upcoming treatment for recurrent cancer. Please advise.

## 2020-08-15 ENCOUNTER — Telehealth: Payer: Self-pay

## 2020-08-15 ENCOUNTER — Other Ambulatory Visit (HOSPITAL_COMMUNITY): Payer: Self-pay | Admitting: Interventional Radiology

## 2020-08-15 ENCOUNTER — Other Ambulatory Visit: Payer: Self-pay | Admitting: Pharmacist

## 2020-08-15 ENCOUNTER — Other Ambulatory Visit: Payer: Self-pay | Admitting: Internal Medicine

## 2020-08-15 DIAGNOSIS — N2889 Other specified disorders of kidney and ureter: Secondary | ICD-10-CM

## 2020-08-15 DIAGNOSIS — I480 Paroxysmal atrial fibrillation: Secondary | ICD-10-CM

## 2020-08-15 DIAGNOSIS — F419 Anxiety disorder, unspecified: Secondary | ICD-10-CM

## 2020-08-15 NOTE — Telephone Encounter (Signed)
Received a TC from a nurse, Geni Bers, with [facility], who went out to patient's home for a routine visit set up via her insurance company.  She states the patient told her she has been slighty SOB over the past 2 weeks.  Nurse states her V/S and O2 sats were all WNL.  The nurse states that the patient has not told any of her providers about this symptom.  The nurse encouraged her to inform her cardiologist. Will forward to PCP. SChaplin, RN,BSN

## 2020-08-15 NOTE — Telephone Encounter (Signed)
The patient is requesting a nurse call back.

## 2020-08-15 NOTE — Telephone Encounter (Signed)
RTC, Patient is wanting to let PCP know that her CT guide tissue ablation has been scheduled for 09/12/20.  Patient did not see any reason to delay this and wanting PCP to know date d/t PCP trying to arrange transfusion for anemia. SChaplin, RN,BSN

## 2020-08-17 ENCOUNTER — Encounter: Payer: Self-pay | Admitting: Internal Medicine

## 2020-08-17 ENCOUNTER — Telehealth: Payer: Self-pay

## 2020-08-17 NOTE — Telephone Encounter (Signed)
RTC, this is addressed in encounter from 08/15/20 This encounter closed. SChaplin, RN,BSN

## 2020-08-17 NOTE — Progress Notes (Signed)
Arranging for outpatient transfusion of 2 units PRBC.  I spoke to the infusion centers both at [facility] and at [facility] and each are unable to accommodate this until over a week from now.  Contacted patient placement who states that no beds are available currently, and several are on hold.  [name] is placed on the direct admission list anticipating that a bed will be available tomorrow.  I was unable to speak to [name] (phone not answered) but reached her sister [name] by phone.  She informed me that [name] is currently taking a nap and probably has her phone turned off.  She will contact [name] this evening to let her know to anticipate a phone call directing her to the hospital at the appropriate time. Tomorrow's admitting resident [name] notified.  Goal of this observation admission is for transfusion of blood, preceded by iron studies (history of iron deficiency which has been treated in the past with IV iron infusion).  [name] has been feeling tired and dyspneic with exertion.  She has noted no bleeding and no melena.  She states that this has been a recurrent problem over the years.  Cryoablation of recurrent RCC is scheduled for 09/12/2020, anticipating an overnight observation admission at that time.  At the end of this month, she has a preop appointment with her pulmonologist [name].  Note that [name] has an unusual dosing for her opioid.  Long explanation - she feels that the current prescription works best for her chronic pain. Polypharmacy, particularly sedating, is an acknowledged concern - I have had many conversations with her about this.

## 2020-08-17 NOTE — Telephone Encounter (Signed)
RTC to patient, notified of Dr. [name] response and patient verbalized understanding. SChaplin, RN,BSN

## 2020-08-17 NOTE — Telephone Encounter (Signed)
Requesting to speak with a nurse, please call pt back.

## 2020-08-18 ENCOUNTER — Encounter: Payer: Self-pay | Admitting: Internal Medicine

## 2020-08-18 ENCOUNTER — Observation Stay (HOSPITAL_COMMUNITY)
Admission: AD | Admit: 2020-08-18 | Discharge: 2020-08-19 | Disposition: A | Discharge status: Home / Self Care | Payer: HMO | Source: Ambulatory Visit | Attending: Student in an Organized Health Care Education/Training Program | Admitting: Student in an Organized Health Care Education/Training Program

## 2020-08-18 DIAGNOSIS — D509 Iron deficiency anemia, unspecified: Secondary | ICD-10-CM | POA: Diagnosis not present

## 2020-08-18 DIAGNOSIS — E118 Type 2 diabetes mellitus with unspecified complications: Secondary | ICD-10-CM | POA: Diagnosis present

## 2020-08-18 DIAGNOSIS — D649 Anemia, unspecified: Secondary | ICD-10-CM | POA: Diagnosis present

## 2020-08-18 DIAGNOSIS — Z20822 Contact with and (suspected) exposure to covid-19: Secondary | ICD-10-CM | POA: Diagnosis not present

## 2020-08-18 DIAGNOSIS — F17210 Nicotine dependence, cigarettes, uncomplicated: Secondary | ICD-10-CM | POA: Diagnosis not present

## 2020-08-18 DIAGNOSIS — I480 Paroxysmal atrial fibrillation: Secondary | ICD-10-CM | POA: Diagnosis present

## 2020-08-18 DIAGNOSIS — E1169 Type 2 diabetes mellitus with other specified complication: Secondary | ICD-10-CM | POA: Diagnosis present

## 2020-08-18 DIAGNOSIS — Z9861 Coronary angioplasty status: Secondary | ICD-10-CM | POA: Insufficient documentation

## 2020-08-18 DIAGNOSIS — J449 Chronic obstructive pulmonary disease, unspecified: Secondary | ICD-10-CM | POA: Diagnosis not present

## 2020-08-18 DIAGNOSIS — I10 Essential (primary) hypertension: Secondary | ICD-10-CM | POA: Diagnosis not present

## 2020-08-18 DIAGNOSIS — E1140 Type 2 diabetes mellitus with diabetic neuropathy, unspecified: Secondary | ICD-10-CM | POA: Insufficient documentation

## 2020-08-18 DIAGNOSIS — Z7901 Long term (current) use of anticoagulants: Secondary | ICD-10-CM

## 2020-08-18 DIAGNOSIS — Z794 Long term (current) use of insulin: Secondary | ICD-10-CM | POA: Insufficient documentation

## 2020-08-18 LAB — PROTIME-INR
INR: 1.6 — ABNORMAL HIGH (ref 0.8–1.2)
Prothrombin Time: 19.3 seconds — ABNORMAL HIGH (ref 11.4–15.2)

## 2020-08-18 LAB — RESP PANEL BY RT-PCR (FLU A&B, COVID) ARPGX2
Influenza A by PCR: NEGATIVE
Influenza B by PCR: NEGATIVE
SARS Coronavirus 2 by RT PCR: NEGATIVE

## 2020-08-18 LAB — IRON AND TIBC
Iron: 15 ug/dL — ABNORMAL LOW (ref 28–170)
Saturation Ratios: 3 % — ABNORMAL LOW (ref 10.4–31.8)
TIBC: 490 ug/dL — ABNORMAL HIGH (ref 250–450)
UIBC: 475 ug/dL

## 2020-08-18 LAB — FERRITIN: Ferritin: 4 ng/mL — ABNORMAL LOW (ref 11–307)

## 2020-08-18 LAB — PREPARE RBC (CROSSMATCH)

## 2020-08-18 MED ORDER — SODIUM CHLORIDE 0.9 % IV SOLN: 250.0000 mg | Freq: Once | INTRAVENOUS | Status: DC

## 2020-08-18 MED ORDER — DULOXETINE HCL 60 MG PO CPEP
60.0000 mg | ORAL_CAPSULE | Freq: Two times a day (BID) | ORAL | Status: DC
  Administered 2020-08-18 – 2020-08-19 (×2): 60 mg via ORAL
  Filled 2020-08-18: qty 1
  Filled 2020-08-18: qty 2

## 2020-08-18 MED ORDER — FUROSEMIDE 40 MG PO TABS: 40.0000 mg | ORAL_TABLET | Freq: Every day | ORAL | Status: DC

## 2020-08-18 MED ORDER — HYDROCODONE-ACETAMINOPHEN 5-325 MG PO TABS
2.0000 | ORAL_TABLET | Freq: Three times a day (TID) | ORAL | Status: DC | PRN
  Administered 2020-08-18 – 2020-08-19 (×2): 2 via ORAL
  Filled 2020-08-18 (×2): qty 2

## 2020-08-18 MED ORDER — BUPROPION HCL ER (XL) 150 MG PO TB24
150.0000 mg | ORAL_TABLET | ORAL | Status: DC
  Administered 2020-08-19: 150 mg via ORAL
  Filled 2020-08-18: qty 1

## 2020-08-18 MED ORDER — INSULIN ASPART 100 UNIT/ML IJ SOLN: 0.0000 [IU] | Freq: Three times a day (TID) | INTRAMUSCULAR | Status: DC

## 2020-08-18 MED ORDER — INSULIN DEGLUDEC-LIRAGLUTIDE 100-3.6 UNIT-MG/ML ~~LOC~~ SOPN: 40.0000 [IU] | PEN_INJECTOR | Freq: Every day | SUBCUTANEOUS | Status: DC

## 2020-08-18 MED ORDER — ALPRAZOLAM 0.5 MG PO TABS
1.0000 mg | ORAL_TABLET | Freq: Every evening | ORAL | Status: DC | PRN
  Administered 2020-08-18: 1 mg via ORAL
  Filled 2020-08-18: qty 2

## 2020-08-18 MED ORDER — POTASSIUM CHLORIDE CRYS ER 20 MEQ PO TBCR
20.0000 meq | EXTENDED_RELEASE_TABLET | Freq: Every day | ORAL | Status: DC
  Administered 2020-08-18: 20 meq via ORAL
  Filled 2020-08-18: qty 1

## 2020-08-18 MED ORDER — TRIAMCINOLONE ACETONIDE 0.5 % EX CREA
1.0000 "application " | TOPICAL_CREAM | Freq: Two times a day (BID) | CUTANEOUS | Status: DC
  Administered 2020-08-18 – 2020-08-19 (×3): 1 via TOPICAL
  Filled 2020-08-18: qty 15

## 2020-08-18 MED ORDER — SODIUM CHLORIDE 0.9% IV SOLUTION: Freq: Once | INTRAVENOUS | Status: AC

## 2020-08-18 MED ORDER — METOPROLOL SUCCINATE ER 25 MG PO TB24
50.0000 mg | ORAL_TABLET | Freq: Every day | ORAL | Status: DC
  Administered 2020-08-19: 50 mg via ORAL
  Filled 2020-08-18: qty 2

## 2020-08-18 MED ORDER — FUROSEMIDE 40 MG PO TABS
40.0000 mg | ORAL_TABLET | Freq: Once | ORAL | Status: AC
  Administered 2020-08-18: 40 mg via ORAL
  Filled 2020-08-18: qty 1

## 2020-08-18 MED ORDER — SODIUM CHLORIDE 0.9 % IV SOLN
250.0000 mg | Freq: Once | INTRAVENOUS | Status: AC
  Administered 2020-08-18: 250 mg via INTRAVENOUS
  Filled 2020-08-18 (×2): qty 20

## 2020-08-18 MED ORDER — GABAPENTIN 300 MG PO CAPS
600.0000 mg | ORAL_CAPSULE | Freq: Three times a day (TID) | ORAL | Status: DC
  Administered 2020-08-18 – 2020-08-19 (×3): 600 mg via ORAL
  Filled 2020-08-18 (×3): qty 2

## 2020-08-18 MED ORDER — ALBUTEROL SULFATE (2.5 MG/3ML) 0.083% IN NEBU: 3.0000 mL | INHALATION_SOLUTION | Freq: Four times a day (QID) | RESPIRATORY_TRACT | Status: DC | PRN

## 2020-08-18 MED ORDER — NICOTINE 21 MG/24HR TD PT24
21.0000 mg | MEDICATED_PATCH | Freq: Every day | TRANSDERMAL | Status: DC
  Administered 2020-08-18: 21 mg via TRANSDERMAL
  Filled 2020-08-18: qty 1

## 2020-08-18 MED ORDER — INSULIN ASPART 100 UNIT/ML IJ SOLN: 0.0000 [IU] | Freq: Every day | INTRAMUSCULAR | Status: DC

## 2020-08-18 MED ORDER — POTASSIUM CHLORIDE CRYS ER 20 MEQ PO TBCR
40.0000 meq | EXTENDED_RELEASE_TABLET | Freq: Every morning | ORAL | Status: DC
  Administered 2020-08-19: 40 meq via ORAL
  Filled 2020-08-18: qty 2

## 2020-08-18 MED ORDER — FUROSEMIDE 80 MG PO TABS
80.0000 mg | ORAL_TABLET | Freq: Every day | ORAL | Status: AC
  Administered 2020-08-19: 80 mg via ORAL
  Filled 2020-08-18: qty 1

## 2020-08-18 MED ORDER — ROSUVASTATIN CALCIUM 20 MG PO TABS
20.0000 mg | ORAL_TABLET | Freq: Every day | ORAL | Status: DC
  Administered 2020-08-18: 20 mg via ORAL
  Filled 2020-08-18: qty 1

## 2020-08-18 MED ORDER — WARFARIN - PHARMACIST DOSING INPATIENT: Freq: Every day | Status: DC

## 2020-08-18 MED ORDER — WARFARIN SODIUM 2.5 MG PO TABS
2.5000 mg | ORAL_TABLET | Freq: Once | ORAL | Status: AC
  Administered 2020-08-18: 2.5 mg via ORAL
  Filled 2020-08-18: qty 1

## 2020-08-18 MED ORDER — WARFARIN SODIUM 2.5 MG PO TABS: 2.5000 mg | ORAL_TABLET | Freq: Every day | ORAL | Status: DC

## 2020-08-18 NOTE — Assessment & Plan Note (Addendum)
We will continue to try to help her realize it is possible to quit. Our pharmacy team is developing a smoking cessation program which may be a good fit for her.

## 2020-08-18 NOTE — H&P (Signed)
Date: 08/18/2020               Patient Name:  [name] MRN: [id number]  DOB: 06-Apr-1950 Age / Sex: 70 y.o., female   PCP: Angelica Pou, MD              Medical Service: Internal Medicine Teaching Service              Attending Physician: Dr. Evette Doffing, Mallie Mussel, *    First Contact: Vern Claude, MS 4 Pager: [phone]  Second Contact: Dr. Tamsen Snider Pager: [phone]            After Hours (After 5p/  First Contact Pager: [phone]  weekends / holidays): Second Contact Pager: [phone]   Chief Complaint: Symptomatic anemia  History of Present Illness: [name] is 70 y.o. F with COPD, diabetes, depression, chronic pain, mitral stenosis s/p bioprosthetic MV replacement, paroxysmal atrial fibrillation, GI bleeds and pulmonary hypertension/RV failure and recurrent renal cell carcinoma who presented to the hospital at the direction of her PCP for anemia.  Patient reports subacute fatigue, shortness of breath, dizziness and mental fog over the past few weeks. Has had steady weight loss over the past two years. Says she has been craving ice a lot. Denies recent blood loss, hematemesis, melena or hematochezia. Had colonoscopy and EGD this past April that were unrevealing. Patient says she has a history of iron deficiency anemia requiring iron transfusions.   CBC done at PCP office last month showed a hemoglobin of 7.8, a drop form 9.6 three months ago. Patient symptomatic from anemia and has upcoming RCC ablation. PCP was unable to secure outpatient blood transfusion sooner and given continued worsening of symptoms, decision was made to send her to the hospital for inpatient transfusion.   Meds: Current Facility-Administered Medications  Medication Dose Route Frequency Provider Last Rate Last Admin   0.9 %  sodium chloride infusion (Manually program via Guardrails IV Fluids)   Intravenous Once Madalyn Rob, MD       albuterol (PROVENTIL) (2.5 MG/3ML) 0.083%  nebulizer solution 3 mL  3 mL Inhalation Q6H PRN Madalyn Rob, MD       ALPRAZolam [name]) tablet 1 mg  1 mg Oral QHS PRN Madalyn Rob, MD       [START ON 08/19/2020] buPROPion (WELLBUTRIN XL) 24 hr tablet 150 mg  150 mg Oral Zannie Kehr, MD       DULoxetine (CYMBALTA) DR capsule 60 mg  60 mg Oral BID Madalyn Rob, MD       Derrill Memo ON 08/19/2020] furosemide (LASIX) tablet 40 mg  40 mg Oral Daily Madalyn Rob, MD       gabapentin (NEURONTIN) capsule 600 mg  600 mg Oral TID Madalyn Rob, MD       HYDROcodone-acetaminophen (NORCO/VICODIN) 5-325 MG per tablet 2 tablet  2 tablet Oral TID PRN Madalyn Rob, MD       [START ON 08/19/2020] Insulin Degludec-Liraglutide (XULTOPHY) 100-3.6 UNIT-MG/ML SOPN 40 Units  40 Units Subcutaneous Daily Madalyn Rob, MD       Derrill Memo ON 08/19/2020] metoprolol succinate (TOPROL-XL) 24 hr tablet 50 mg  50 mg Oral Daily Madalyn Rob, MD       potassium chloride SA (KLOR-CON) CR tablet 20 mEq  20 mEq Oral QPC lunch Madalyn Rob, MD       Derrill Memo ON 08/19/2020] potassium chloride SA (KLOR-CON) CR tablet 40 mEq  40 mEq Oral q morning Madalyn Rob, MD  rosuvastatin (CRESTOR) tablet 20 mg  20 mg Oral QHS Madalyn Rob, MD       triamcinolone cream (KENALOG) 0.5 % 1 application  1 application Topical BID Madalyn Rob, MD       warfarin (COUMADIN) tablet 2.5 mg  2.5 mg Oral ONCE-1600 Darlina Sicilian, [facility]       Warfarin - Pharmacist Dosing Inpatient   Does not apply [id number] Darlina Sicilian, [facility]        Allergies: Allergies as of 08/17/2020 - Review Complete 08/07/2020  Allergen Reaction Noted   Dexlansoprazole Other (See Comments) 11/08/2019   Lorazepam  02/09/2019   Other Other (See Comments) 11/08/2019   Lorazepam Other (See Comments) 11/04/2010   Morphine and related Other (See Comments) 06/27/2013   Oxycontin [oxycodone] Other (See Comments) 05/03/2012   Tramadol hcl Swelling    Past Medical History:  Diagnosis Date   Abnormality of lung on CXR 02/02/2020    Nonspecific finding on CXR ordered by pulmonologist - c/w inflammation vs infection, f/u imaging suggested, Dr. [name] office has been in communication about recommended next steps.   Anxiety 07/24/2010   Aortic atherosclerosis ([place]) 10/19/2014   Seen on CT scan, currently asymptomatic   Arteriovenous malformation of gastrointestinal tract 08/08/2015   Non-bleeding when visualized on capsule endoscopy 06/30/2015    Arthritis    "lower back; hands" (02/19/2018)   Asymptomatic cholelithiasis 09/25/2015   Seen on CT scan 08/2015   Carotid artery stenosis; s/p R endarterectomy    s/p right endarterectomy (06/2010) Carotid US (07/2010):  Left: Moderate-to-severe (60-79%) calcific and non-calcific plaque origin and proximal ICA and ECA    Chronic congestive heart failure with left ventricular diastolic dysfunction (HCC) 10/21/2010   Chronic constipation 02/03/2011   Chronic daily headache 01/16/2014   Chronic iron deficiency anemia    Chronic low back pain 10/06/2012   Chronic venous insufficiency 08/04/2012   COPD (chronic obstructive pulmonary disease) with emphysema (HCC)    PFTs 2018: severe obstructive disease, insignif response to bronchiodilator, mild restriction parenchymal pattern, moderately severe diffusion defect. 2014  FEV1 0.92 (40%), ratio 69, 27% increase in FEV1 with BD, TLC 91%, severe airtrapping, DLCO49% On chronic home O2. Pulmonary rehab referral 05/2012    Fibromyalgia 08/29/2010   Gastroesophageal reflux disease    History of blood transfusion    "several times"  (02/19/2018)   History of clear cell renal cell carcinoma ([place]), in remission 07/21/2011   s/p cryoablation of left RCC in 09/2011 by [name]. Followed by [name]  [facility]) .     History of hiatal hernia    History of mitral valve replacement with bioprosthetic valve due to mitral stenosis 2012   s/p MVR with a 27-mm pericardial porcine valve (Medtronic Mosaic valve, serial #[id number] on 09/20/10, Dr.  Prescott Gum)    History of obstructive sleep apnea, resolved 2013   resolved per sleep study 07/2019; no apnea, but did have desaturation.  CPAP no longer necessary.  Nocturnal polysomnography (06/2009): Moderate sleep apnea/ hypopnea syndrome , AHI 17.8 per hour with nonpositional hypopneas. CPAP titration to 12 CWP, AHI 2.4 per hour. On nocturnal CPAP via a small resMed Quattro full-face mask with heated humidifier.    History of pneumonia    "once"  (02/19/2018)   History of seborrheic keratosis 09/28/2015   Hyperlipidemia LDL goal < 100 11/20/2005   Internal and external hemorrhoids without complication 8/67/6195   Lichen sclerosus of female genitalia 01/12/2017   Migraine    "none  in years" (02/19/2018)   Moderately severe major depression ([place]) 11/19/2005   Nocturnal hypoxia per sleep study 07/2019    Osteoporosis    DEXA 2016: T -2.7; DEXA (12/09/2011): L-spine T -3.7, left hip T -1.4 DEXA (12/2004): L-spine T -2.6, left hip -0.1    Paroxysmal atrial fibrillation ([place]) 10/22/2010   s/p Left atrial maze procedure for paroxysmal atrial fibrillation on 09/20/2010 by Dr Prescott Gum.  Subsequent splenic infarct, decision was made to re-anticoagulate with coumadin, likely life-long as this is the most likely cause of the splenic infarct.    Personal history of colonic polyps 05/14/2011   Colonoscopy (05/2011): 4 mm adenomatous polyp excised endoscopically Colonoscopy (02/2002): Adenomatous polyp excised endoscopically    Pulmonary hypertension due to chronic obstructive pulmonary disease ([place]) 04/25/2016   2014 TEE w PA peak pressure 46 mmHg, s/p MV replacement    Right nephrolithiasis, asymptomatic, incidental finding 09/06/2014   5 mm non-obstructing calculus seen on CT scan 09/05/2014    Right ventricular failure ([place]) 04/25/2016   Severe obesity (BMI 35.0-39.9) with comorbidity ([place]) 10/23/2011   Sleep apnea    Tobacco abuse 07/28/2012   Type 2 diabetes mellitus with diabetic neuropathy [facility])     Past Surgical History:  Procedure Laterality Date   CARDIAC CATHETERIZATION     CARDIAC VALVE REPLACEMENT  Aug. 2012   "mitral valve"   CAROTID ENDARTERECTOMY Right 07/04/2010   by [name] for asymptomatic right carotid artery stenosis   CATARACT EXTRACTION W/ INTRAOCULAR LENS  IMPLANT, BILATERAL Bilateral    CHEST TUBE INSERTION  09/24/2010   Dr Lucianne Lei Trigt   COLONOSCOPY  05/12/2011   performed by Dr. Michail Sermon. Showing small internal hemorrhoids, single tubular adenoma polyp   COLONOSCOPY WITH PROPOFOL N/A 05/29/2020   Procedure: COLONOSCOPY WITH PROPOFOL;  Surgeon: Clarene Essex, MD;  Location: [facility] ENDOSCOPY;  Service: Endoscopy;  Laterality: N/A;   CRYOABLATION Left 09/2011   by [name]. Followed by [name]  [facility]) .     DILATION AND CURETTAGE OF UTERUS     ESOPHAGOGASTRODUODENOSCOPY  05/12/2011   performed by Dr. Michail Sermon. Negative for ulcerations, biopsy negative for evidence of celiac sprue   ESOPHAGOGASTRODUODENOSCOPY N/A 06/29/2015   Procedure: ESOPHAGOGASTRODUODENOSCOPY (EGD);  Surgeon: Clarene Essex, MD;  Location: [facility] ENDOSCOPY;  Service: Endoscopy;  Laterality: N/A;   ESOPHAGOGASTRODUODENOSCOPY N/A 03/29/2016   Procedure: ESOPHAGOGASTRODUODENOSCOPY (EGD);  Surgeon: Clarene Essex, MD;  Location: [facility] ENDOSCOPY;  Service: Endoscopy;  Laterality: N/A;   ESOPHAGOGASTRODUODENOSCOPY (EGD) WITH PROPOFOL N/A 05/29/2020   Procedure: ESOPHAGOGASTRODUODENOSCOPY (EGD) WITH PROPOFOL;  Surgeon: Clarene Essex, MD;  Location: [facility] ENDOSCOPY;  Service: Endoscopy;  Laterality: N/A;   FRACTURE SURGERY     GIVENS CAPSULE STUDY N/A 06/30/2015   Procedure: GIVENS CAPSULE STUDY;  Surgeon: Clarene Essex, MD;  Location: [place];  Service: Endoscopy;  Laterality: N/A;   GIVENS CAPSULE STUDY N/A 06/29/2015   Procedure: GIVENS CAPSULE STUDY;  Surgeon: Clarene Essex, MD;  Location: [place];  Service: Endoscopy;  Laterality: N/A;   HEMORRHOID SURGERY  1970s?   "lanced"   HYSTEROSCOPY W/  ENDOMETRIAL ABLATION  06/2001   for persistent post-menopausal bleeding // by S. [name], M.D.   IR GENERIC HISTORICAL  08/23/2015   IR RADIOLOGIST EVAL & MGMT 08/23/2015 Aletta Edouard, MD [facility] INTERV RAD   IR GENERIC HISTORICAL  04/09/2016   IR RADIOLOGIST EVAL & MGMT 04/09/2016 Aletta Edouard, MD [facility] INTERV RAD   IR RADIOLOGIST EVAL & MGMT  10/07/2016   IR RADIOLOGIST EVAL &  MGMT  06/25/2017   IR RADIOLOGIST EVAL & MGMT  07/31/2020   LEFT HEART CATH AND CORONARY ANGIOGRAPHY N/A 04/21/2016   Procedure: Left Heart Cath and Coronary Angiography;  Surgeon: Lorretta Harp, MD;  Location: [place] CV LAB;  Service: Cardiovascular;  Laterality: N/A;   LIPOMA EXCISION  08/2005   occipital lipoma 1.5cm - by Dr. [name]   LITHOTRIPSY  ~ 2000   MAZE Left 09/20/10   for paroxysmal atrial fibrillation (Dr. Prescott Gum)   MITRAL VALVE REPLACEMENT  09/20/10    with a 27-mm pericardial porcine valve (Medtronic Mosaic valve, serial #[id number]). 09/20/10, Dr Prescott Gum   ORIF CLAVICLE FRACTURE Right 01/2004   by Thana Farr. [name], M.D for Right clavicle nonunion.; "it's got a pin in it"   POLYPECTOMY  05/29/2020   Procedure: POLYPECTOMY;  Surgeon: Clarene Essex, MD;  Location: [facility] ENDOSCOPY;  Service: Endoscopy;;   REFRACTIVE SURGERY Bilateral    RIGHT HEART CATH N/A 04/23/2016   Procedure: Right Heart Cath;  Surgeon: [name], MD;  Location: [place] CV LAB;  Service: Cardiovascular;  Laterality: N/A;   TONSILLECTOMY     TUBAL LIGATION     Family History  Problem Relation Age of Onset   Peptic Ulcer Disease Father    Heart attack Father 73       Died of MI at age 48   Heart attack Brother 48       Died of MI at age 85   Obesity Brother    Pneumonia Mother    Healthy Sister    Lupus Daughter    Obsessive Compulsive Disorder Daughter    Social History   Socioeconomic History   Marital status: Divorced    Spouse name: Not on file   Number of children: Not on file   Years of education:  Not on file   Highest education level: Not on file  Occupational History   Not on file  Tobacco Use   Smoking status: Every Day    Packs/day: 1.00    Years: 55.00    Pack years: 55.00    Types: Cigarettes    Start date: 11/01/1962   Smokeless tobacco: Never  Vaping Use   Vaping Use: Never used  Substance and Sexual Activity   Alcohol use: Not Currently    Comment: 02/19/2018 "nothing since 1999"   Drug use: Not Currently    Types: Cocaine, Marijuana    Comment: 02/19/2018 "nothing since the 1990s"   Sexual activity: Not Currently    Birth control/protection: Post-menopausal  Other Topics Concern   Not on file  Social History Narrative   Lives alone in [place] ([place])   Worked at [employer] for 18 years   No car   Social Determinants of Radio broadcast assistant Strain: Not on [employer] Insecurity: No Food Insecurity   Worried About Charity fundraiser in the Last Year: Never true   Arboriculturist in the Last Year: Never true  Transportation Needs: No Transportation Needs   Lack of Transportation (Medical): No   Lack of Transportation (Non-Medical): No  Physical Activity: Not on file  Stress: Not on file  Social Connections: Not on file  Intimate Partner Violence: Not on file    Review of Systems: Pertinent items are noted in HPI.  Physical Exam: Blood pressure 122/60, pulse (!) 57, temperature 98.2 F (36.8 C), temperature source Oral, resp. rate 16, SpO2 100 %. General:  well-appearing middle-age female with obese body habitus laying in bed in no acute distress. Head: Normocephalic. Atraumatic. CV: RRR. No murmurs, rubs, or gallops. Trace LE edema Pulmonary: Small breathing effort, likely due to body habitus. Diffuse faint wheezing on inspiration and expiration. Abdominal: Soft, nontender, nondistended. Normal bowel sounds. Extremities: Palpable pulses. Normal ROM. Skin: Warm and dry. No obvious rash or lesions. Neuro: A&Ox3. Moves all  extremities. Normal sensation. No focal deficit. Psych: Normal mood and affect   Lab results:  Imaging results:  No results found.  Other results: EKG: none  Assessment & Plan by Problem: [name] is a 70 y.o. female with history of chronic anemia, reccurent renal cell carcinoma, COPD, paroxysmal A.fib and extensive cardiovascular disease who is being admitted for symptomatic anemia. Active Problems:   Symptomatic anemia  Symptomatic Anemia  Iron deficiency anemia Patient with history of chronic iron deficiency anemia requiring iron transfusion. She has remote history of GI bleed but denies recent blood loss. Had colonoscopy and EGD in April 2022 that were unrevealing. Etiology of her anemia unclear. Of note, patient has a recurrence of RCC and is scheduled for cryoablation in august 2022. Recent Hgb  drop from 9.8 to 7.6  over a period of 3 months. Her PCP, Dr. [name] was unable to secure outpatient blood transfusion/ She directed patient to the hospital for inpatient transfusion of 2 units and iron studies given worsening of symptoms of anemia. -2 units of pRBC -CBC -CMP -hemolysis labs (reticulocyte count, haptoglobin) -IV iron -f/u with PCP.  DM II- insulin dependent Most recent A1c of 7.1. patient would like to use her home xultophy and continuous glucose monitor while her at the hospital.  -continue home insulin regimen - Consult diabetic coordinator to review freestyle libre  - SSI-S  COPD No recent exacerbation. Continue home regimen.  Chronic pain - continue home norco -continue home gabapentin  Paroxysmal A.fib  hx of bioprosthetic mitral valve replacement Continue anticoagulation with warfarin, dosing per pharmacy.   Depression  anxiety -continue home regimen  This is a Careers information officer Note.  The care of the patient was discussed with Dr. Tamsen Snider and the assessment and plan was formulated with their assistance.  Please see their note for  official documentation of the patient encounter.   Signed: Ross Ludwig, Medical Student 08/18/2020, 2:59 PM    Attestation for Student Documentation:  I personally was present and performed or re-performed the history, physical exam and medical decision-making activities of this service and have verified that the service and findings are accurately documented in the student's note.  Madalyn Rob, MD 08/18/2020, 4:39 PM

## 2020-08-18 NOTE — Progress Notes (Signed)
Pt refused CBG check. She has her own CBG meter--152

## 2020-08-18 NOTE — Progress Notes (Signed)
Inpatient Diabetes Program Recommendations  AACE/ADA: New Consensus Statement on Inpatient Glycemic Control (2015)  Target Ranges:  Prepandial:   less than 140 mg/dL      Peak postprandial:   less than 180 mg/dL (1-2 hours)      Critically ill patients:  140 - 180 mg/dL   Lab Results  Component Value Date   GLUCAP 219 (H) 08/02/2020   HGBA1C 7.1 (A) 08/02/2020    Review of Glycemic Control  Diabetes history: DM 2 Outpatient Diabetes medications:  Current orders for Inpatient glycemic control:  Xultophy 40 units Daily (not on formulary)  A1c 7.1%  Consult for pt wanting to use freestyle libre while in the hospital overnight.  It is no longer recommended to use CGM while in the hospital. It was only approved for a short time during the state of emergency during the [place] pandemic. CBGs via our blood glucose meters are per policy what we need to use at this time.  Note home medication not on formulary unless pt brought from home. Equivalent would be lantus or levemir 40 units would also recommend Novolog 0-15 units tid + hs.  Tama Headings RN, MSN, BC-ADM Inpatient Diabetes Coordinator Team Pager [phone] (8a-5p)

## 2020-08-18 NOTE — Assessment & Plan Note (Signed)
Continues to need O2, esp during sleep.

## 2020-08-18 NOTE — Progress Notes (Addendum)
ANTICOAGULATION CONSULT NOTE - Initial Consult  Pharmacy Consult for Warfarin Indication: atrial fibrillation  Allergies  Allergen Reactions   Dexlansoprazole Other (See Comments)    unknown   Lorazepam Other (See Comments)    unknown   Other Other (See Comments)   Lorazepam Other (See Comments)    Patient's sister noted that ativan caused the patient to become extremely confused during hospitalization 09/2010; tolerates Xanax   Morphine And Related Other (See Comments)    Injection site reaction   Oxycontin [Oxycodone] Other (See Comments)    headache   Tramadol Hcl Swelling    Ankle swelling    Patient Measurements:    Vital Signs: Temp: 98.2 F (36.8 C) (07/09 1211) Temp Source: Oral (07/09 1211) BP: 122/60 (07/09 1211) Pulse Rate: 57 (07/09 1211)  Labs: No results for input(s): HGB, HCT, PLT, APTT, LABPROT, INR, HEPARINUNFRC, HEPRLOWMOCWT, CREATININE, CKTOTAL, CKMB, TROPONINIHS in the last 72 hours.  Estimated Creatinine Clearance: 44.8 mL/min (A) (by C-G formula based on SCr of 1.09 mg/dL (H)).   Medical History: Past Medical History:  Diagnosis Date   Abnormality of lung on CXR 02/02/2020   Nonspecific finding on CXR ordered by pulmonologist - c/w inflammation vs infection, f/u imaging suggested, Dr. [name] office has been in communication about recommended next steps.   Anxiety 07/24/2010   Aortic atherosclerosis ([place]) 10/19/2014   Seen on CT scan, currently asymptomatic   Arteriovenous malformation of gastrointestinal tract 08/08/2015   Non-bleeding when visualized on capsule endoscopy 06/30/2015    Arthritis    "lower back; hands" (02/19/2018)   Asymptomatic cholelithiasis 09/25/2015   Seen on CT scan 08/2015   Carotid artery stenosis; s/p R endarterectomy    s/p right endarterectomy (06/2010) Carotid US (07/2010):  Left: Moderate-to-severe (60-79%) calcific and non-calcific plaque origin and proximal ICA and ECA    Chronic congestive heart failure with left  ventricular diastolic dysfunction (HCC) 10/21/2010   Chronic constipation 02/03/2011   Chronic daily headache 01/16/2014   Chronic iron deficiency anemia    Chronic low back pain 10/06/2012   Chronic venous insufficiency 08/04/2012   COPD (chronic obstructive pulmonary disease) with emphysema (HCC)    PFTs 2018: severe obstructive disease, insignif response to bronchiodilator, mild restriction parenchymal pattern, moderately severe diffusion defect. 2014  FEV1 0.92 (40%), ratio 69, 27% increase in FEV1 with BD, TLC 91%, severe airtrapping, DLCO49% On chronic home O2. Pulmonary rehab referral 05/2012    Fibromyalgia 08/29/2010   Gastroesophageal reflux disease    History of blood transfusion    "several times"  (02/19/2018)   History of clear cell renal cell carcinoma ([place]), in remission 07/21/2011   s/p cryoablation of left RCC in 09/2011 by [name]. Followed by [name]  [facility]) .     History of hiatal hernia    History of mitral valve replacement with bioprosthetic valve due to mitral stenosis 2012   s/p MVR with a 27-mm pericardial porcine valve (Medtronic Mosaic valve, serial #[id number] on 09/20/10, Dr. Prescott Gum)    History of obstructive sleep apnea, resolved 2013   resolved per sleep study 07/2019; no apnea, but did have desaturation.  CPAP no longer necessary.  Nocturnal polysomnography (06/2009): Moderate sleep apnea/ hypopnea syndrome , AHI 17.8 per hour with nonpositional hypopneas. CPAP titration to 12 CWP, AHI 2.4 per hour. On nocturnal CPAP via a small resMed Quattro full-face mask with heated humidifier.    History of pneumonia    "once"  (02/19/2018)   History of seborrheic  keratosis 09/28/2015   Hyperlipidemia LDL goal < 100 11/20/2005   Internal and external hemorrhoids without complication 7/58/8325   Lichen sclerosus of female genitalia 01/12/2017   Migraine    "none in years" (02/19/2018)   Moderately severe major depression ([place]) 11/19/2005   Nocturnal  hypoxia per sleep study 07/2019    Osteoporosis    DEXA 2016: T -2.7; DEXA (12/09/2011): L-spine T -3.7, left hip T -1.4 DEXA (12/2004): L-spine T -2.6, left hip -0.1    Paroxysmal atrial fibrillation ([place]) 10/22/2010   s/p Left atrial maze procedure for paroxysmal atrial fibrillation on 09/20/2010 by Dr Prescott Gum.  Subsequent splenic infarct, decision was made to re-anticoagulate with coumadin, likely life-long as this is the most likely cause of the splenic infarct.    Personal history of colonic polyps 05/14/2011   Colonoscopy (05/2011): 4 mm adenomatous polyp excised endoscopically Colonoscopy (02/2002): Adenomatous polyp excised endoscopically    Pulmonary hypertension due to chronic obstructive pulmonary disease ([place]) 04/25/2016   2014 TEE w PA peak pressure 46 mmHg, s/p MV replacement    Right nephrolithiasis, asymptomatic, incidental finding 09/06/2014   5 mm non-obstructing calculus seen on CT scan 09/05/2014    Right ventricular failure ([place]) 04/25/2016   Severe obesity (BMI 35.0-39.9) with comorbidity ([place]) 10/23/2011   Sleep apnea    Tobacco abuse 07/28/2012   Type 2 diabetes mellitus with diabetic neuropathy [facility])     Assessment: 70 yo female on warfarin PTA for atrial fibrillation. The pt has a hx of L atrial maze procedure in 2012. The patient has a hx of splenic infarct and significant GI bleeds from AVMs. Due to this, INR goal is 1.5-2.5. Pharmacy is consulted to dose warfarin.  Warfarin home dose: 2.5 mg daily except 1.25 mg on Wed, and NONE on Mon  INR is therapeutic at 1.6 today. No new interacting medications have been started, the patient's diet PTA was reportedly stable.  Goal of Therapy:  INR 1.5-2.5 Monitor platelets by anticoagulation protocol: Yes   Plan:  -Give warfarin PO 2.5 mg tonight x1 dose -Monitor a daily PT/INR and CBC -Monitor for signs and symptoms of bleeding   Shauna Hugh, PharmD, [place]  PGY-2 Pharmacy Resident 08/18/2020 2:47 PM  Please check  [email or web address] for unit-specific pharmacy phone numbers.

## 2020-08-18 NOTE — Hospital Course (Addendum)
  [name] is a 70 y.o. female with history of chronic anemia, reccurent renal cell carcinoma, COPD, paroxysmal A.fib and extensive cardiovascular disease.   Patient is a direct admit by Dr. Dorian Pod for symptomatic anemia with blood transfusion and  and iron studies.  Symptomatic Anemia  Iron deficiency anemia Patient with history of chronic iron deficiency anemia requiring iron transfusion. She has remote history of GI bleed but denies recent blood loss. Had colonoscopy and EGD in April 2022 that were unrevealing. Etiology of her anemia unclear. Of note, patient has a recurrence of RCC and is scheduled for cryoablation in august 2022. Recent Hgb  drop from 9.8 to 7.6  over a period of 3 months. Her PCP, Dr. [name] was unable to secure outpatient blood transfusion. She directed patient to the hospital for inpatient transfusion of 2 units and iron studies given worsening of symptoms of anemia.  Patient was given two units of blood. Iron studies consistent with Iron deficiency anemia so patient was given ferric gluconate 250 mg IV x2   Hgb 9.3 on day of discharge. Patient discharged in good conditions with instructions to follow up with PCP in 2 weeks

## 2020-08-18 NOTE — Assessment & Plan Note (Addendum)
No acute change, but not going to improve whie she still smokes. Appears to be developing bronchiolitis. Watch for [employer]. Plan- CXR, enroll low dose screening CT program

## 2020-08-19 DIAGNOSIS — D509 Iron deficiency anemia, unspecified: Secondary | ICD-10-CM | POA: Diagnosis not present

## 2020-08-19 DIAGNOSIS — D649 Anemia, unspecified: Secondary | ICD-10-CM | POA: Diagnosis not present

## 2020-08-19 LAB — BPAM RBC
Blood Product Expiration Date: 202207292359
Blood Product Expiration Date: 202207292359
ISSUE DATE / TIME: 202207091631
ISSUE DATE / TIME: 202207092009
Unit Type and Rh: 600
Unit Type and Rh: 600

## 2020-08-19 LAB — BASIC METABOLIC PANEL
Anion gap: 7 (ref 5–15)
BUN: 18 mg/dL (ref 8–23)
CO2: 27 mmol/L (ref 22–32)
Calcium: 9 mg/dL (ref 8.9–10.3)
Chloride: 101 mmol/L (ref 98–111)
Creatinine, Ser: 1.1 mg/dL — ABNORMAL HIGH (ref 0.44–1.00)
GFR, Estimated: 54 mL/min — ABNORMAL LOW (ref >60)
Glucose, Bld: 136 mg/dL — ABNORMAL HIGH (ref 70–99)
Potassium: 4.1 mmol/L (ref 3.5–5.1)
Sodium: 135 mmol/L (ref 135–145)

## 2020-08-19 LAB — TYPE AND SCREEN
ABO/RH(D): A NEG
Antibody Screen: NEGATIVE
Unit division: 0
Unit division: 0

## 2020-08-19 LAB — CBC
HCT: 31.6 % — ABNORMAL LOW (ref 36.0–46.0)
Hemoglobin: 9.3 g/dL — ABNORMAL LOW (ref 12.0–15.0)
MCH: 23.4 pg — ABNORMAL LOW (ref 26.0–34.0)
MCHC: 29.4 g/dL — ABNORMAL LOW (ref 30.0–36.0)
MCV: 79.4 fL — ABNORMAL LOW (ref 80.0–100.0)
Platelets: 179 10*3/uL (ref 150–400)
RBC: 3.98 MIL/uL (ref 3.87–5.11)
RDW: 18.1 % — ABNORMAL HIGH (ref 11.5–15.5)
WBC: 7.4 10*3/uL (ref 4.0–10.5)
nRBC: 0.3 % — ABNORMAL HIGH (ref 0.0–0.2)

## 2020-08-19 LAB — PROTIME-INR
INR: 1.6 — ABNORMAL HIGH (ref 0.8–1.2)
Prothrombin Time: 18.6 seconds — ABNORMAL HIGH (ref 11.4–15.2)

## 2020-08-19 MED ORDER — SODIUM CHLORIDE 0.9 % IV SOLN
250.0000 mg | Freq: Once | INTRAVENOUS | Status: AC
  Administered 2020-08-19: 250 mg via INTRAVENOUS
  Filled 2020-08-19: qty 20

## 2020-08-19 MED ORDER — NICOTINE 14 MG/24HR TD PT24
14.0000 mg | MEDICATED_PATCH | Freq: Every day | TRANSDERMAL | Status: DC
  Administered 2020-08-19: 14 mg via TRANSDERMAL
  Filled 2020-08-19: qty 1

## 2020-08-19 MED ORDER — NICOTINE 14 MG/24HR TD PT24: 14.0000 mg | MEDICATED_PATCH | Freq: Every day | TRANSDERMAL | Status: DC

## 2020-08-19 MED ORDER — WARFARIN SODIUM 2.5 MG PO TABS
2.5000 mg | ORAL_TABLET | Freq: Once | ORAL | Status: DC
  Filled 2020-08-19: qty 1

## 2020-08-19 NOTE — Discharge Instructions (Addendum)
You came in to receive a blood transfusion and for blood work. You have iron deficiency and gave you IV iron while you were here . Dr.William will continue further workup with your other doctors to figure out why you are iron deficient. Thank you for trusting [place] with your care.

## 2020-08-19 NOTE — Discharge Summary (Addendum)
Name: [name] MRN: [id number] DOB: Apr 21, 1950 70 y.o. PCP: Kathryn Pou, MD  Date of Admission: 08/18/2020 10:12 AM Date of Discharge: 08/19/2020 Attending Physician: [name], *  Discharge Diagnosis: 1. Symptomatic anemia, iron deficiency anemia  Discharge Medications: Allergies as of 08/19/2020       Reactions   Dexlansoprazole Other (See Comments)   unknown   Lorazepam Other (See Comments)   unknown   Other Other (See Comments)   Lorazepam Other (See Comments)   Patient's sister noted that ativan caused the patient to become extremely confused during hospitalization 09/2010; tolerates Xanax   Morphine And Related Other (See Comments)   Injection site reaction   Oxycontin [oxycodone] Other (See Comments)   headache   Tramadol Hcl Swelling   Ankle swelling        Medication List     TAKE these medications    Accu-Chek FastClix Lancet Kit Check your blood 4 times a day dx code 250.00 insulin requiring   albuterol 108 (90 Base) MCG/ACT inhaler Commonly known as: ProAir HFA Inhale 2 puffs into the lungs every 6 (six) hours as needed for shortness of breath.   ALPRAZolam 1 MG tablet Commonly known as: XANAX TAKE 1 TABLET BY MOUTH EVERY NIGHT AT BEDTIME AS NEEDED FOR INSOMNIA. MAY TAKE 1/2 TABLET BY MOUTH DURING THE DAY FOR ANXIETY What changed: See the new instructions.   benzonatate 100 MG capsule Commonly known as: TESSALON Take 1 capsule (100 mg total) by mouth 3 (three) times daily as needed for cough.   betamethasone dipropionate 0.05 % cream Apply topically 2 (two) times daily. What changed:  how much to take when to take this reasons to take this   buPROPion 150 MG 24 hr tablet Commonly known as: Wellbutrin XL Take 1 tablet (150 mg total) by mouth every morning for 365 doses. What changed: when to take this   clobetasol ointment 0.05 % Commonly known as: TEMOVATE Apply 1 application topically 2 (two) times daily  as needed (dry skin).   DULoxetine 60 MG capsule Commonly known as: CYMBALTA Take 1 capsule (60 mg total) by mouth 2 (two) times daily.   fluticasone 50 MCG/ACT nasal spray Commonly known as: FLONASE Place 2 sprays into both nostrils daily.   FreeStyle Libre 2 Sensor Misc 1 each by Does not apply route 6 (six) times daily. To read 6 times daily with change of sensor every 2 weeks, 90 days worth with 3 refills   furosemide 80 MG tablet Commonly known as: LASIX TAKE 1 TABLET BY MOUTH EVERY MORNING AND TAKE 1/2 TABLET EVERY EVENING. What changed:  how much to take how to take this when to take this additional instructions   gabapentin 300 MG capsule Commonly known as: NEURONTIN Take 2 capsules (600 mg total) by mouth 3 (three) times daily.   HYDROcodone-acetaminophen 5-325 MG tablet Commonly known as: NORCO/VICODIN Take 2 tablets by mouth 3 (three) times daily as needed for moderate pain or severe pain.   Insulin Pen Needle 32G X 4 MM Misc Use to inject insulin up to 6 times a day   Insulin Syringe-Needle U-100 31G X 15/64" 0.5 ML Misc Use to inject insulin up to 4 times a day   ipratropium-albuterol 0.5-2.5 (3) MG/3ML Soln Commonly known as: DUONEB Take 3 mLs by nebulization every 6 (six) hours as needed for up to 28 days. What changed: reasons to take this   lidocaine 5 % Commonly known as: Lidoderm Place 1  patch onto the skin every 12 (twelve) hours. Remove & Discard patch within 12 hours or as directed by MD What changed:  when to take this reasons to take this additional instructions   metoprolol succinate 50 MG 24 hr tablet Commonly known as: TOPROL-XL Take 1 tablet (50 mg total) by mouth daily. Take with or immediately following a meal.   nystatin powder Commonly known as: nystatin Apply 1 application topically 3 (three) times daily as needed. What changed: reasons to take this   omeprazole 40 MG capsule Commonly known as: PRILOSEC Take 1 capsule (40 mg  total) by mouth in the morning and at bedtime.   OneTouch Verio test strip Generic drug: glucose blood Use to check blood sugar up to 7 times weekly. diag code E11.40. Insulin dependent   OneTouch Verio w/Device Kit 1 each by Does not apply route 4 (four) times daily.   OXYGEN Inhale 2 L into the lungs at bedtime.   potassium chloride SA 20 MEQ tablet Commonly known as: KLOR-CON Take 2 tabs by mouth every morning and 1 tab every evening to replace potassium lost from lasix use. What changed:  how much to take how to take this when to take this additional instructions   PRESCRIPTION MEDICATION Inhale into the lungs at bedtime. CPAP   rosuvastatin 20 MG tablet Commonly known as: CRESTOR TAKE 1 TABLET BY MOUTH ONCE EVERY NIGHT AT BEDTIME   Trelegy Ellipta 100-62.5-25 MCG/INH Aepb Generic drug: Fluticasone-Umeclidin-Vilant Inhale 1 puff into the lungs daily.   warfarin 2.5 MG tablet Commonly known as: COUMADIN TAKE 1 TABLET BY MOUTH ON THURSDAY, FRIDAY, SATURDAY AND SUNDAY, NOTHING ON MONDAY, TAKE 1/2 TABLET ON TUESDAY AND WEDNESDAY What changed:  how much to take how to take this when to take this additional instructions   Xultophy 100-3.6 UNIT-MG/ML Sopn Generic drug: Insulin Degludec-Liraglutide Inject 40 Units into the skin daily for 30 doses.        Disposition and follow-up:   [name] was discharged from [facility] in Good condition.  At the hospital follow up visit please address:  1.  IDA: I appreciate further workup of IDA is underway in outpatient setting. Rough estimate using [name] shows patient was 850 mg deficient in iron, received 750 mg of iron during admission with IV iron and pRBC. Consider oral supplemental iron.   2.  Labs / imaging needed at time of follow-up: none  3.  Pending labs/ test needing follow-up: none  Follow-up Appointments:   Hospital Course by problem list:  [name] is a 70 y.o. female with history of chronic anemia, reccurent renal cell carcinoma, COPD, paroxysmal A.fib and extensive cardiovascular disease.   Patient is a direct admit by [name] for symptomatic anemia with blood transfusion and  and iron studies.  Symptomatic Anemia  Iron deficiency anemia Patient with history of chronic iron deficiency anemia requiring iron transfusion. She has remote history of GI bleed but denies recent blood loss. Had colonoscopy and EGD in April 2022 that were unrevealing. Etiology of her anemia unclear. Of note, patient has a recurrence of RCC and is scheduled for cryoablation in august 2022. Recent Hgb  drop from 9.8 to 7.6  over a period of 3 months. Her PCP, Dr. [name] was unable to secure outpatient blood transfusion. She directed patient to the hospital for inpatient transfusion of 2 units and iron studies given worsening of symptoms of anemia.  Patient was given two units  of blood. Iron studies consistent with Iron deficiency anemia so patient was given ferric gluconate 250 mg IV x2   Hgb 9.3 on day of discharge. Patient discharged in good conditions with instructions to follow up with PCP in 2 weeks   Subjective Patient says she is not happy that her medications were not given the exact time she is used to taking them. Says she was about to order breakfast before care team came in. Patient says she is ready to leave once her iron transfusion is done. When asked about any blood loss as cause of her chronic iron deficiency anemia, she says doctors keep asking about blood loss as cause of her iron deficiency anemia and would like them to stop calling it that because she is not losing blood. Care team explained to patient chronic iron deficiency anemia usually has an underlying cause. Patient says she is trying to get a CPAP machine to use at home and that she would look into it after discharge from the hospital. Discharge vitals BP 121/80 (BP  Location: Left Arm)   Pulse 63   Temp 97.9 F (36.6 C) (Oral)   Resp 18   SpO2 95%   Discharge exam:  General: Pleasant, well-appearing middle-age female with obese body habitus  sitting in a chair in no acute .in no acute distress. Head: Normocephalic. Atraumatic. CV: RRR. No murmurs, rubs, or gallops. No LE edema Pulmonary: Lungs CTAB. Normal effort. No wheezing or rales. Abdominal: Soft, nontender, nondistended. Normal bowel sounds. Extremities: Palpable pulses. Normal ROM. Skin: Warm and dry. No obvious rash or lesions. Neuro: A&Ox3. Moves all extremities. Normal sensation. No focal deficit. Psych: Normal mood and affect   Pertinent Labs, Studies, and Procedures:    Lab Results  Component Value Date   WBC 7.4 08/19/2020   HGB 9.3 (L) 08/19/2020   HCT 31.6 (L) 08/19/2020   MCV 79.4 (L) 08/19/2020   PLT 179 08/19/2020     Discharge Instructions: Discharge Instructions     Diet - low sodium heart healthy   Complete by: As directed    Increase activity slowly   Complete by: As directed        Signed: Tamsen Snider, MD PGY3 Internal Medicine [phone]

## 2020-08-19 NOTE — Plan of Care (Signed)
  Problem: Health Behavior/Discharge Planning: Goal: Ability to manage health-related needs will improve Outcome: Adequate for Discharge

## 2020-08-19 NOTE — Progress Notes (Signed)
Nsg Discharge Note  Admit Date:  08/18/2020 Discharge date: 08/19/2020   [name] to be D/C'd Home per MD order.  AVS completed.   Patient/caregiver able to verbalize understanding.  Discharge Medication: Allergies as of 08/19/2020       Reactions   Dexlansoprazole Other (See Comments)   unknown   Lorazepam Other (See Comments)   unknown   Other Other (See Comments)   Lorazepam Other (See Comments)   Patient's sister noted that ativan caused the patient to become extremely confused during hospitalization 09/2010; tolerates Xanax   Morphine And Related Other (See Comments)   Injection site reaction   Oxycontin [oxycodone] Other (See Comments)   headache   Tramadol Hcl Swelling   Ankle swelling        Medication List     TAKE these medications    Accu-Chek FastClix Lancet Kit Check your blood 4 times a day dx code 250.00 insulin requiring   albuterol 108 (90 Base) MCG/ACT inhaler Commonly known as: ProAir HFA Inhale 2 puffs into the lungs every 6 (six) hours as needed for shortness of breath.   ALPRAZolam 1 MG tablet Commonly known as: XANAX TAKE 1 TABLET BY MOUTH EVERY NIGHT AT BEDTIME AS NEEDED FOR INSOMNIA. MAY TAKE 1/2 TABLET BY MOUTH DURING THE DAY FOR ANXIETY What changed: See the new instructions.   benzonatate 100 MG capsule Commonly known as: TESSALON Take 1 capsule (100 mg total) by mouth 3 (three) times daily as needed for cough.   betamethasone dipropionate 0.05 % cream Apply topically 2 (two) times daily. What changed:  how much to take when to take this reasons to take this   buPROPion 150 MG 24 hr tablet Commonly known as: Wellbutrin XL Take 1 tablet (150 mg total) by mouth every morning for 365 doses. What changed: when to take this   clobetasol ointment 0.05 % Commonly known as: TEMOVATE Apply 1 application topically 2 (two) times daily as needed (dry skin).   DULoxetine 60 MG capsule Commonly known as: CYMBALTA Take 1  capsule (60 mg total) by mouth 2 (two) times daily.   fluticasone 50 MCG/ACT nasal spray Commonly known as: FLONASE Place 2 sprays into both nostrils daily.   FreeStyle Libre 2 Sensor Misc 1 each by Does not apply route 6 (six) times daily. To read 6 times daily with change of sensor every 2 weeks, 90 days worth with 3 refills   furosemide 80 MG tablet Commonly known as: LASIX TAKE 1 TABLET BY MOUTH EVERY MORNING AND TAKE 1/2 TABLET EVERY EVENING. What changed:  how much to take how to take this when to take this additional instructions   gabapentin 300 MG capsule Commonly known as: NEURONTIN Take 2 capsules (600 mg total) by mouth 3 (three) times daily.   HYDROcodone-acetaminophen 5-325 MG tablet Commonly known as: NORCO/VICODIN Take 2 tablets by mouth 3 (three) times daily as needed for moderate pain or severe pain.   Insulin Pen Needle 32G X 4 MM Misc Use to inject insulin up to 6 times a day   Insulin Syringe-Needle U-100 31G X 15/64" 0.5 ML Misc Use to inject insulin up to 4 times a day   ipratropium-albuterol 0.5-2.5 (3) MG/3ML Soln Commonly known as: DUONEB Take 3 mLs by nebulization every 6 (six) hours as needed for up to 28 days. What changed: reasons to take this   lidocaine 5 % Commonly known as: Lidoderm Place 1 patch onto the skin every 12 (twelve) hours. Remove &  Discard patch within 12 hours or as directed by MD What changed:  when to take this reasons to take this additional instructions   metoprolol succinate 50 MG 24 hr tablet Commonly known as: TOPROL-XL Take 1 tablet (50 mg total) by mouth daily. Take with or immediately following a meal.   nystatin powder Commonly known as: nystatin Apply 1 application topically 3 (three) times daily as needed. What changed: reasons to take this   omeprazole 40 MG capsule Commonly known as: PRILOSEC Take 1 capsule (40 mg total) by mouth in the morning and at bedtime.   OneTouch Verio test strip Generic  drug: glucose blood Use to check blood sugar up to 7 times weekly. diag code E11.40. Insulin dependent   OneTouch Verio w/Device Kit 1 each by Does not apply route 4 (four) times daily.   OXYGEN Inhale 2 L into the lungs at bedtime.   potassium chloride SA 20 MEQ tablet Commonly known as: KLOR-CON Take 2 tabs by mouth every morning and 1 tab every evening to replace potassium lost from lasix use. What changed:  how much to take how to take this when to take this additional instructions   PRESCRIPTION MEDICATION Inhale into the lungs at bedtime. CPAP   rosuvastatin 20 MG tablet Commonly known as: CRESTOR TAKE 1 TABLET BY MOUTH ONCE EVERY NIGHT AT BEDTIME   Trelegy Ellipta 100-62.5-25 MCG/INH Aepb Generic drug: Fluticasone-Umeclidin-Vilant Inhale 1 puff into the lungs daily.   warfarin 2.5 MG tablet Commonly known as: COUMADIN TAKE 1 TABLET BY MOUTH ON THURSDAY, FRIDAY, SATURDAY AND SUNDAY, NOTHING ON MONDAY, TAKE 1/2 TABLET ON TUESDAY AND WEDNESDAY What changed:  how much to take how to take this when to take this additional instructions   Xultophy 100-3.6 UNIT-MG/ML Sopn Generic drug: Insulin Degludec-Liraglutide Inject 40 Units into the skin daily for 30 doses.        Discharge Assessment: Vitals:   08/19/20 0741 08/19/20 1130  BP: (!) 153/61 121/80  Pulse: 62 63  Resp: 18 18  Temp: 98.1 F (36.7 C) 97.9 F (36.6 C)  SpO2: 95% 95%   Skin clean, dry and intact without evidence of skin break down, no evidence of skin tears noted. IV catheter discontinued intact. Site without signs and symptoms of complications - no redness or edema noted at insertion site, patient denies c/o pain - only slight tenderness at site.  Dressing with slight pressure applied.  D/c Instructions-Education: Discharge instructions given to patient/family with verbalized understanding. D/c education completed with patient/family including follow up instructions, medication list, d/c  activities limitations if indicated, with other d/c instructions as indicated by MD - patient able to verbalize understanding, all questions fully answered. Patient instructed to return to ED, call 911, or call MD for any changes in condition.  Patient escorted via [place], and D/C home via private auto.  [name], Jolene Schimke, RN 08/19/2020 12:30 PM

## 2020-08-19 NOTE — Progress Notes (Signed)
[place] for Warfarin Indication: atrial fibrillation  Allergies  Allergen Reactions   Dexlansoprazole Other (See Comments)    unknown   Lorazepam Other (See Comments)    unknown   Other Other (See Comments)   Lorazepam Other (See Comments)    Patient's sister noted that ativan caused the patient to become extremely confused during hospitalization 09/2010; tolerates Xanax   Morphine And Related Other (See Comments)    Injection site reaction   Oxycontin [Oxycodone] Other (See Comments)    headache   Tramadol Hcl Swelling    Ankle swelling    Patient Measurements:    Vital Signs: Temp: 98.1 F (36.7 C) (07/10 0741) Temp Source: Oral (07/10 0741) BP: 153/61 (07/10 0741) Pulse Rate: 62 (07/10 0741)  Labs: Recent Labs    08/18/20 1603 08/19/20 0050  HGB  --  9.3*  HCT  --  31.6*  PLT  --  179  LABPROT 19.3* 18.6*  INR 1.6* 1.6*  CREATININE  --  1.10*    Estimated Creatinine Clearance: 44.4 mL/min (A) (by C-G formula based on SCr of 1.1 mg/dL (H)).   Medical History: Past Medical History:  Diagnosis Date   Abnormality of lung on CXR 02/02/2020   Nonspecific finding on CXR ordered by pulmonologist - c/w inflammation vs infection, f/u imaging suggested, Dr. [name] office has been in communication about recommended next steps.   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   Non-bleeding when visualized on capsule endoscopy 06/30/2015    Arthritis    "lower back; hands" (02/19/2018)   Asymptomatic cholelithiasis 09/25/2015   Seen on CT scan 08/2015   Carotid artery stenosis; s/p R endarterectomy    s/p right endarterectomy (06/2010) Carotid US (07/2010):  Left: Moderate-to-severe (60-79%) calcific and non-calcific plaque origin and proximal ICA and ECA    Chronic congestive heart failure with left ventricular  diastolic dysfunction (HCC) 10/21/2010   Chronic constipation 02/03/2011   Chronic daily headache 01/16/2014   Chronic iron deficiency anemia    Chronic low back pain 10/06/2012   Chronic venous insufficiency 08/04/2012   COPD (chronic obstructive pulmonary disease) with emphysema (HCC)    PFTs 2018: severe obstructive disease, insignif response to bronchiodilator, mild restriction parenchymal pattern, moderately severe diffusion defect. 2014  FEV1 0.92 (40%), ratio 69, 27% increase in FEV1 with BD, TLC 91%, severe airtrapping, DLCO49% On chronic home O2. Pulmonary rehab referral 05/2012    Fibromyalgia 08/29/2010   Gastroesophageal reflux disease    History of blood transfusion    "several times"  (02/19/2018)   History of clear cell renal cell carcinoma ([place]), in remission 07/21/2011   s/p cryoablation of left RCC in 09/2011 by [name]. Followed by [name]  [facility]) .     History of hiatal hernia    History of mitral valve replacement with bioprosthetic valve due to mitral stenosis 2012   s/p MVR with a 27-mm pericardial porcine valve (Medtronic Mosaic valve, serial #[id number] on 09/20/10, Dr. Prescott Gum)    History of obstructive sleep apnea, resolved 2013   resolved per sleep study 07/2019; no apnea, but did have desaturation.  CPAP no longer necessary.  Nocturnal polysomnography (06/2009): Moderate sleep apnea/ hypopnea syndrome , AHI 17.8 per hour with nonpositional hypopneas. CPAP titration to 12 CWP, AHI 2.4 per hour. On nocturnal CPAP via a small resMed Quattro full-face mask with  heated humidifier.    History of pneumonia    "once"  (02/19/2018)   History of seborrheic keratosis 09/28/2015   Hyperlipidemia LDL goal < 100 11/20/2005   Internal and external hemorrhoids without complication 08/31/5870   Lichen sclerosus of female genitalia 01/12/2017   Migraine    "none in years" (02/19/2018)   Moderately severe major depression ([place]) 11/19/2005   Nocturnal hypoxia per  sleep study 07/2019    Osteoporosis    DEXA 2016: T -2.7; DEXA (12/09/2011): L-spine T -3.7, left hip T -1.4 DEXA (12/2004): L-spine T -2.6, left hip -0.1    Paroxysmal atrial fibrillation ([place]) 10/22/2010   s/p Left atrial maze procedure for paroxysmal atrial fibrillation on 09/20/2010 by Dr Prescott Gum.  Subsequent splenic infarct, decision was made to re-anticoagulate with coumadin, likely life-long as this is the most likely cause of the splenic infarct.    Personal history of colonic polyps 05/14/2011   Colonoscopy (05/2011): 4 mm adenomatous polyp excised endoscopically Colonoscopy (02/2002): Adenomatous polyp excised endoscopically    Pulmonary hypertension due to chronic obstructive pulmonary disease ([place]) 04/25/2016   2014 TEE w PA peak pressure 46 mmHg, s/p MV replacement    Right nephrolithiasis, asymptomatic, incidental finding 09/06/2014   5 mm non-obstructing calculus seen on CT scan 09/05/2014    Right ventricular failure ([place]) 04/25/2016   Severe obesity (BMI 35.0-39.9) with comorbidity ([place]) 10/23/2011   Sleep apnea    Tobacco abuse 07/28/2012   Type 2 diabetes mellitus with diabetic neuropathy [facility])     Assessment: 70 yo female on warfarin PTA for atrial fibrillation. The pt has a hx of L atrial maze procedure in 2012. The patient also has a hx of splenic infarct and significant GI bleeds from AVMs. Due to this, INR goal is 1.5-2.5. Pharmacy is consulted to dose warfarin.  Warfarin home dose: 2.5 mg daily except 1.25 mg on Wed, and NONE on Mon  INR is therapeutic at 1.6 today. No new interacting medications have been started, the patient's diet PTA was reportedly stable.  Goal of Therapy:  INR 1.5-2.5 Monitor platelets by anticoagulation protocol: Yes   Plan:  -Give warfarin PO 2.5 mg tonight x1 dose -Monitor a daily PT/INR and CBC -Monitor for signs and symptoms of bleeding   Shauna Hugh, PharmD, [place]  PGY-2 Pharmacy Resident 08/19/2020 7:46 AM  Please check [email or web address]  for unit-specific pharmacy phone numbers.

## 2020-08-21 ENCOUNTER — Telehealth: Payer: Self-pay

## 2020-08-21 DIAGNOSIS — I5032 Chronic diastolic (congestive) heart failure: Secondary | ICD-10-CM

## 2020-08-21 MED ORDER — CLOBETASOL PROPIONATE 0.05 % EX OINT: 1.0000 "application " | TOPICAL_OINTMENT | Freq: Two times a day (BID) | CUTANEOUS | 0 refills | Status: DC | PRN

## 2020-08-21 MED ORDER — POTASSIUM CHLORIDE CRYS ER 20 MEQ PO TBCR: EXTENDED_RELEASE_TABLET | ORAL | 3 refills | Status: DC

## 2020-08-21 NOTE — Telephone Encounter (Signed)
Return pt's call. Stated she needs refill on K+ and clobetasol ointment.  Also requesting shingles  vaccine rx sent to [place] [facility]. Also stated  the pharmacy has no received coumadin rx which was sent by Dr Elie Confer on 08/17/20.  I called [facility] pharmacy about coumadin. Talked to Cordelie,pharmacist - stated rx had not been received. Verbal order order given " Warfarin 2.5 mg Sig: TAKE 1 TABLET BY MOUTH ON THURSDAY, FRIDAY, SATURDAY AND SUNDAY, NOTHING ON MONDAY, TAKE 1/2 TABLET ON TUESDAY AND Wednesday Qty 20 x 2 RF."  She stated she did not have a good hospital stay (on 7/9). Stated she arrived on the floor between 11 - 12 noon ; the doctors were nice but meds were not ordered so she did not get her meds until late (about 10 PM). Stated "the floor was busy". Also when she asked for nicotine patch, 21 mg was ordered, she started itching (had a reaction); it was removed and 14 mg patch was ordered and she did ok. She wants her doctor to be aware.  Also call transferred to front office per pt's request in order to schedule her next appt with Dr [name].

## 2020-08-21 NOTE — Telephone Encounter (Signed)
Requesting to speak with a nurse about something, please call pt back.

## 2020-08-23 ENCOUNTER — Other Ambulatory Visit: Payer: Self-pay | Admitting: Radiology

## 2020-08-23 NOTE — Progress Notes (Signed)
Orders request via P.A. line

## 2020-08-27 ENCOUNTER — Ambulatory Visit: Payer: HMO | Admitting: Behavioral Health

## 2020-08-27 DIAGNOSIS — Z7901 Long term (current) use of anticoagulants: Secondary | ICD-10-CM | POA: Diagnosis not present

## 2020-08-27 DIAGNOSIS — I480 Paroxysmal atrial fibrillation: Secondary | ICD-10-CM | POA: Diagnosis not present

## 2020-08-28 DIAGNOSIS — I050 Rheumatic mitral stenosis: Secondary | ICD-10-CM | POA: Diagnosis not present

## 2020-08-28 DIAGNOSIS — J449 Chronic obstructive pulmonary disease, unspecified: Secondary | ICD-10-CM | POA: Diagnosis not present

## 2020-08-28 DIAGNOSIS — I509 Heart failure, unspecified: Secondary | ICD-10-CM | POA: Diagnosis not present

## 2020-08-30 ENCOUNTER — Telehealth: Payer: Self-pay

## 2020-08-30 NOTE — Telephone Encounter (Signed)
RTC, patient states she had an order for the Shingrix vaccine showing up on her MyChart account and she is asking what this is about.  RN informed patient this is the Shingles vaccine and she requested MD to send an order to [facility] on 08/21/20.  Pt verbalized understanding.  Pt states this will be her 2nd shingles vaccine, RN notes there is nothing noted for shingles vaccines under immunization history and asked patient to get a copy of her vaccine record from [facility] so nurses can input information into Epic.  Pt agree's and states she will bring this in at her next visit.  No other questions, encounter closed. SChaplin, RN,BSN

## 2020-08-30 NOTE — Telephone Encounter (Signed)
Requesting to speak with a nurse, please call pt back.

## 2020-09-03 ENCOUNTER — Telehealth: Payer: Self-pay | Admitting: Pharmacist

## 2020-09-03 NOTE — Patient Instructions (Addendum)
DUE TO COVID-19 ONLY ONE VISITOR IS ALLOWED TO COME WITH YOU AND STAY IN THE WAITING ROOM ONLY DURING PRE OP AND PROCEDURE DAY OF SURGERY. THE 1 VISITOR  MAY VISIT WITH YOU AFTER SURGERY IN YOUR PRIVATE ROOM DURING VISITING HOURS ONLY!  YOU NEED TO HAVE A COVID 19 TEST ON: 09/10/20 , THIS TEST MUST BE DONE BEFORE SURGERY,                 [name]   Your procedure is scheduled on: 09/12/20   Report to [facility] Main  Entrance   Report to admitting at: 6:45 AM     Call this number if you have problems the morning of surgery [phone]    Remember: Do not eat solid food :After Midnight. Clear liquids until: 5:45 AM  CLEAR LIQUID DIET  Foods Allowed                                                                     Foods Excluded  Coffee and tea, regular and decaf                             liquids that you cannot  Plain Jell-O any favor except red or purple                                           see through such as: Fruit ices (not with fruit pulp)                                     milk, soups, orange juice  Iced Popsicles                                    All solid food Carbonated beverages, regular and diet                                    Cranberry, grape and apple juices Sports drinks like Gatorade Lightly seasoned clear broth or consume(fat free) Sugar, honey syrup  Sample Menu Breakfast                                Lunch                                     Supper Cranberry juice                    Beef broth                            Chicken broth Jell-O  Grape juice                           Apple juice Coffee or tea                        Jell-O                                      Popsicle                                                Coffee or tea                        Coffee or tea  _____________________________________________________________________   BRUSH YOUR TEETH MORNING OF SURGERY AND  RINSE YOUR MOUTH OUT, NO CHEWING GUM CANDY OR MINTS.    Take these medicines the morning of surgery with A SIP OF WATER: gabapentin,bupropion,duloxetine,metoprolol,omeprazole.Use inhalers as usual.Alprazolam as needed.  How to Manage Your Diabetes Before and After Surgery  Why is it important to control my blood sugar before and after surgery? Improving blood sugar levels before and after surgery helps healing and can limit problems. A way of improving blood sugar control is eating a healthy diet by:  Eating less sugar and carbohydrates  Increasing activity/exercise  Talking with your doctor about reaching your blood sugar goals High blood sugars (greater than 180 mg/dL) can raise your risk of infections and slow your recovery, so you will need to focus on controlling your diabetes during the weeks before surgery. Make sure that the doctor who takes care of your diabetes knows about your planned surgery including the date and location.  How do I manage my blood sugar before surgery? Check your blood sugar at least 4 times a day, starting 2 days before surgery, to make sure that the level is not too high or low. Check your blood sugar the morning of your surgery when you wake up and every 2 hours until you get to the Short Stay unit. If your blood sugar is less than 70 mg/dL, you will need to treat for low blood sugar: Do not take insulin. Treat a low blood sugar (less than 70 mg/dL) with  cup of clear juice (cranberry or apple), 4 glucose tablets, OR glucose gel. Recheck blood sugar in 15 minutes after treatment (to make sure it is greater than 70 mg/dL). If your blood sugar is not greater than 70 mg/dL on recheck, call [phone] for further instructions. Report your blood sugar to the short stay nurse when you get to Short Stay.  If you are admitted to the hospital after surgery: Your blood sugar will be checked by the staff and you will probably be given insulin after surgery (instead  of oral diabetes medicines) to make sure you have good blood sugar levels. The goal for blood sugar control after surgery is 80-180 mg/dL.   WHAT DO I DO ABOUT MY DIABETES MEDICATION?  Do not take oral diabetes medicines (pills) the morning of surgery.  THE DAY BEFORE SURGERY, take liraglutide insulin as usual in AM.      THE MORNING OF SURGERY, DO NOT TAKE ANY DIABETIC MEDICATIONS DAY OF  YOUR SURGERY                               You may not have any metal on your body including hair pins and              piercings  Do not wear jewelry, make-up, lotions, powders or perfumes, deodorant             Do not wear nail polish on your fingernails.  Do not shave  48 hours prior to surgery.    Do not bring valuables to the hospital. [place].  Contacts, dentures or bridgework may not be worn into surgery.  Leave suitcase in the car. After surgery it may be brought to your room.     Patients discharged the day of surgery will not be allowed to drive home. IF YOU ARE HAVING SURGERY AND GOING HOME THE SAME DAY, YOU MUST HAVE AN ADULT TO DRIVE YOU HOME AND BE WITH YOU FOR 24 HOURS. YOU MAY GO HOME BY TAXI OR UBER OR ORTHERWISE, BUT AN ADULT MUST ACCOMPANY YOU HOME AND STAY WITH YOU FOR 24 HOURS.  Name and phone number of your driver:  Special Instructions: N/A              Please read over the following fact sheets you were given: _____________________________________________________________________           [facility] - Preparing for Surgery Before surgery, you can play an important role.  Because skin is not sterile, your skin needs to be as free of germs as possible.  You can reduce the number of germs on your skin by washing with CHG (chlorahexidine gluconate) soap before surgery.  CHG is an antiseptic cleaner which kills germs and bonds with the skin to continue killing germs even after washing. Please DO NOT use if you have an allergy to  CHG or antibacterial soaps.  If your skin becomes reddened/irritated stop using the CHG and inform your nurse when you arrive at Short Stay. Do not shave (including legs and underarms) for at least 48 hours prior to the first CHG shower.  You may shave your face/neck. Please follow these instructions carefully:  1.  Shower with CHG Soap the night before surgery and the  morning of Surgery.  2.  If you choose to wash your hair, wash your hair first as usual with your  normal  shampoo.  3.  After you shampoo, rinse your hair and body thoroughly to remove the  shampoo.                           4.  Use CHG as you would any other liquid soap.  You can apply chg directly  to the skin and wash                       Gently with a scrungie or clean washcloth.  5.  Apply the CHG Soap to your body ONLY FROM THE NECK DOWN.   Do not use on face/ open                           Wound or open sores. Avoid contact with eyes, ears mouth  and genitals (private parts).                       Wash face,  Genitals (private parts) with your normal soap.             6.  Wash thoroughly, paying special attention to the area where your surgery  will be performed.  7.  Thoroughly rinse your body with warm water from the neck down.  8.  DO NOT shower/wash with your normal soap after using and rinsing off  the CHG Soap.                9.  Pat yourself dry with a clean towel.            10.  Wear clean pajamas.            11.  Place clean sheets on your bed the night of your first shower and do not  sleep with pets. Day of Surgery : Do not apply any lotions/deodorants the morning of surgery.  Please wear clean clothes to the hospital/surgery center.  FAILURE TO FOLLOW THESE INSTRUCTIONS MAY RESULT IN THE CANCELLATION OF YOUR SURGERY PATIENT SIGNATURE_________________________________  NURSE SIGNATURE__________________________________  ________________________________________________________________________

## 2020-09-03 NOTE — Progress Notes (Signed)
Pt. Needs orders for upcoming procedure.

## 2020-09-03 NOTE — Telephone Encounter (Signed)
Patient texted me her PST POC Fingerstick INR this morning while I was still at home. Patient has been sent instructions at 10:30am today to continue same regimen: 2.4m warfarin on Su/Tu/Th/Fr/Sa; 1/2 x 2.5 mg (1.288m on We; NO warfarin on Monday. Repeat patient self testing, point of care, fingerstick INR next Monday 1-AUG-22.

## 2020-09-04 ENCOUNTER — Encounter (HOSPITAL_COMMUNITY)
Admission: RE | Admit: 2020-09-04 | Discharge: 2020-09-04 | Disposition: A | Discharge status: Home / Self Care | Payer: HMO | Source: Ambulatory Visit | Attending: Interventional Radiology | Admitting: Interventional Radiology

## 2020-09-04 ENCOUNTER — Encounter (HOSPITAL_COMMUNITY): Payer: Self-pay

## 2020-09-04 ENCOUNTER — Other Ambulatory Visit: Payer: Self-pay

## 2020-09-04 DIAGNOSIS — Z01812 Encounter for preprocedural laboratory examination: Secondary | ICD-10-CM | POA: Insufficient documentation

## 2020-09-04 HISTORY — DX: Dyspnea, unspecified: R06.00

## 2020-09-04 HISTORY — DX: Unspecified asthma, uncomplicated: J45.909

## 2020-09-04 LAB — BASIC METABOLIC PANEL
Anion gap: 6 (ref 5–15)
BUN: 18 mg/dL (ref 8–23)
CO2: 31 mmol/L (ref 22–32)
Calcium: 9.5 mg/dL (ref 8.9–10.3)
Chloride: 101 mmol/L (ref 98–111)
Creatinine, Ser: 0.87 mg/dL (ref 0.44–1.00)
GFR, Estimated: >60 mL/min (ref >60)
Glucose, Bld: 176 mg/dL — ABNORMAL HIGH (ref 70–99)
Potassium: 4.8 mmol/L (ref 3.5–5.1)
Sodium: 138 mmol/L (ref 135–145)

## 2020-09-04 LAB — CBC
HCT: 40 % (ref 36.0–46.0)
Hemoglobin: 11.9 g/dL — ABNORMAL LOW (ref 12.0–15.0)
MCH: 26 pg (ref 26.0–34.0)
MCHC: 29.8 g/dL — ABNORMAL LOW (ref 30.0–36.0)
MCV: 87.3 fL (ref 80.0–100.0)
Platelets: 260 10*3/uL (ref 150–400)
RBC: 4.58 MIL/uL (ref 3.87–5.11)
RDW: 23.6 % — ABNORMAL HIGH (ref 11.5–15.5)
WBC: 7.5 10*3/uL (ref 4.0–10.5)
nRBC: 0 % (ref 0.0–0.2)

## 2020-09-04 NOTE — Progress Notes (Addendum)
COVID Vaccine Completed: Yes Date COVID Vaccine completed: 05/18/19 COVID vaccine manufacturer: Moderna     PCP - Dr. Tori Milks. LOV: 08/02/20 Cardiologist - Dr. Loralie Champagne. LOV: 04/17/20  Chest x-ray - 05/04/20 EKG - 08/07/20 EPIC Stress Test -  ECHO - 01/17/20 Cardiac Cath -  Pacemaker/ICD device last checked: A1-C: 7.1: 08/02/20 EPIC Sleep Study -  CPAP -   Fasting Blood Sugar - 100's Checks Blood Sugar: continuous CBG monitor on RUA.  Blood Thinner Instructions:Coumadin Aspirin Instructions:Coumadin will be held on 09/06/20 as per pt.,she does not remember who gave her the instructions. Last Dose:  Anesthesia review: Hx: HTN,OSA,CHF,COPD,DIA,Afib,Smoker,OSA(CPAP),Uses Oxygen to sleep(2 1/2 L).Pt. has some weakness on her legs,she does not walk long distances.  Patient denies shortness of breath, fever, cough and chest pain at PAT appointment   Patient verbalized understanding of instructions that were given to them at the PAT appointment. Patient was also instructed that they will need to review over the PAT instructions again at home before surgery.

## 2020-09-05 NOTE — Progress Notes (Signed)
HPI F Smoker for evaluation of Nocturnal Hypoxemia courtesy of Dr [name] with original question of OSA, complicated by  Aortic  Atherosclerosis, AVM GI tract, CHF, Mitral Valve Replacement,  Cardiac Amyloid, PAD, Venous Insufficiency, HTN, Migraine, COPD, Pulmonary Hypertension, GERD, DM2, Hx Renal Carcinoma, Depression, Tobacco abuse, Obesity,  NPSG at [facility] 01/18/03- AHI 8.6/ hr -Nocturnal polysomnography (06/2009): Moderate sleep apnea/ hypopnea syndrome , AHI 17.8 per hour with nonpositional hypopneas. CPAP titration to 12 CWP, AHI 2.4 per hour.Body weight 183 lb NPSG 07/22/19- AHI 0.0/ htr, desat to 83%, with mean 93% after adding O2 2L, body weight 170 lbs PFT 04/23/16- Mod severe obstr F/F 0.69, FEV1 1.05/ 48%, insig resp to BD, TLC 76%, DLCO 52%  ==================================================================  05/03/20- 71 yoF Smoker(1 ppd) for evaluation of Nocturnal Hypoxemia courtesy of Dr [name] with original question of OSA, complicated by  Aortic  Atherosclerosis, AVM GI tract, CHF, Mitral Valve Replacement,  Cardiac Amyloid, PAD, Venous Insufficiency, HTN, Migraine, COPD, Pulmonary Hypertension, GERD, DM2, Hx Renal Carcinoma, Depression, Tobacco abuse, Obesity, Anemia,  O2 2L -3 sleep- owns her concentrator and portable O2 We ordered HST to be done on room air, off CPAP to recheck after original NPSG- not done - Albuterol hfa, Trelegy 100, Neb Duoneb,  Body weight today-170 lbs Covid vax-3 Moderna Flu vax-had Labs 04/18/20- Hgb 9.6, CO2 33H, Glu 193, Creat 1.23, BNP 132,  ------Doing ok, no new issues right now Occ WASO to use rescue inhaler. Not prepared to try to stop smoking now. CXR reviewed with bronchiolitis, possible nodule. CXR 02/02/20- IMPRESSION: Prior median sternotomy and cardiac valve replacement. Heart size within normal limits. Atherosclerotic calcification of the aortic knob. Mild pulmonary vascular congestion Bibasilar  interstitial prominence with subtle nodularity within the left mid to lower lung field which could represent foci of infection or inflammation. Underlying pulmonary nodule not excluded. Recommend radiographic follow-up to resolution.  09/06/20- 32 yoF Smoker(1 ppd) for evaluation of Nocturnal Hypoxemia , COPD, Chronic Hypoxic Resp Failure, complicated by  Aortic  Atherosclerosis, AVM GI tract, CHF, Mitral Valve Replacement,  Cardiac Amyloid, PAD, Venous Insufficiency, Pulmonary Hypertension, GERD, DM2,  Renal Carcinoma, Depression, Tobacco abuse, Obesity, Anemia,  HTN, Migraine,  -Trelegy 100, Proair hfa, Neb Duoneb,  O2 2L -3 sleep- owns her concentrator and portable O2                Hosp 08/18/20- Iron def Anemia Scheduled for CT ablation 8/3- kidney CA recurrent Covid vax-4 Moderna Breathing well- better with anemia being actively treated.  Discussed dyspnea related to anemia. Sleeps comfortably with O2. O2 sat on room air today 95%. No acute issues to delay kidney ablation. Watch for respiratory depression during recovery . Still smoking againsst advice. CT chest low dose screen 05/22/20- IMPRESSION: 1. Lung-RADS 2, benign appearance or behavior. Continue annual screening with low-dose chest CT without contrast in 12 months. 2. Cholelithiasis. 3. 3.0 cm low-attenuation lesion off the upper pole right kidney is difficult to definitively characterize without post-contrast imaging and has enlarged from 10 mm on 08/23/2015. Consider further evaluation with ultrasound, as clinically indicated. 4. Aortic atherosclerosis (ICD10-I70.0). Coronary artery calcification. 5.  Emphysema (ICD10-J43.9).  ROS-see HPI   + = positive Constitutional:    weight loss, night sweats, fevers, chills, fatigue, lassitude. HEENT:    headaches, difficulty swallowing, tooth/dental problems, sore throat,       sneezing, itching, ear ache, nasal congestion, post nasal drip, snoring CV:    chest pain, orthopnea, PND,  swelling  in lower extremities, anasarca,                                   dizziness, palpitations Resp:   +shortness of breath with exertion or at rest.                productive cough,   non-productive cough, coughing up of blood.              change in color of mucus.  wheezing.   Skin:    rash or lesions. GI:  No-   heartburn, indigestion, abdominal pain, nausea, vomiting, diarrhea,                 change in bowel habits, loss of appetite GU: dysuria, change in color of urine, no urgency or frequency.   flank pain. MS:   joint pain, stiffness, decreased range of motion, back pain. Neuro-     nothing unusual Psych:  change in mood or affect.  depression or anxiety.   memory loss.  OBJ- Physical Exam      +wheelchair  General- Alert, Oriented, Affect-appropriate, Distress- none acute, + wheelchair,  room air Skin- rash-none, lesions- none, excoriation- none Lymphadenopathy- none Head- atraumatic            Eyes- Gross vision intact, PERRLA, conjunctivae and secretions clear            Ears- Hearing, canals-normal            Nose- Clear, no-Septal dev, mucus, polyps, erosion, perforation             Throat- Mallampati IV , mucosa clear , drainage- none, tonsils- atrophic, + edentulous Neck- flexible , trachea midline, no stridor , thyroid nl, carotid no bruit Chest - symmetrical excursion , unlabored           Heart/CV- RRR , no murmur , no gallop  , no rub, nl s1 s2                           - JVD- none , edema- none, stasis changes- none, varices- none           Lung- clear to P&A, wheeze- none, cough+ , dullness-none, rub- none           Chest wall-  Abd-  Br/ Gen/ Rectal- Not done, not indicated Extrem- cyanosis- none, clubbing, none, atrophy- none, strength- nl Neuro- grossly intact to observation

## 2020-09-06 ENCOUNTER — Telehealth: Payer: Self-pay | Admitting: Pharmacist

## 2020-09-06 ENCOUNTER — Encounter: Payer: Self-pay | Admitting: Internal Medicine

## 2020-09-06 ENCOUNTER — Other Ambulatory Visit: Payer: Self-pay

## 2020-09-06 ENCOUNTER — Ambulatory Visit: Payer: HMO | Admitting: Internal Medicine

## 2020-09-06 DIAGNOSIS — J449 Chronic obstructive pulmonary disease, unspecified: Secondary | ICD-10-CM | POA: Diagnosis not present

## 2020-09-06 DIAGNOSIS — G4734 Idiopathic sleep related nonobstructive alveolar hypoventilation: Secondary | ICD-10-CM | POA: Diagnosis not present

## 2020-09-06 NOTE — Telephone Encounter (Signed)
Patient called me and reminded me she has been instructed by her physician who is to procedure her,  to [place] after dose administered tomorrow, Friday, July 29th, 2022 in preparation for a kidney ablation procedure on WEDNESDAY 3-AUG-22.

## 2020-09-06 NOTE — Patient Instructions (Signed)
We can continue current meds  Good luck with your surgery! Please call if we can help

## 2020-09-06 NOTE — Assessment & Plan Note (Signed)
Stable with O2 2L for sleep and doing well today on room air while up and awake.  Plan- ok to proceed with renal ablation. Needs respiratory observation during Recovery.

## 2020-09-06 NOTE — Assessment & Plan Note (Signed)
COPD severe, persistent, uncomplicated Well-controlled at present Plan- continue current meds

## 2020-09-06 NOTE — Telephone Encounter (Signed)
Acknowledged and agree.

## 2020-09-07 NOTE — Anesthesia Preprocedure Evaluation (Addendum)
Anesthesia Evaluation  Patient identified by MRN, date of birth, ID band Patient awake    Reviewed: Allergy & Precautions, NPO status , Patient's Chart, lab work & pertinent test results  Airway Mallampati: II  TM Distance: >3 FB Neck ROM: Full    Dental no notable dental hx. (+) Edentulous Upper, Edentulous Lower   Pulmonary asthma , sleep apnea (O2 at night), Continuous Positive Airway Pressure Ventilation and Oxygen sleep apnea , COPD, Current Smoker,    Pulmonary exam normal breath sounds clear to auscultation       Cardiovascular hypertension, + Peripheral Vascular Disease and +CHF  Normal cardiovascular exam Rhythm:Regular Rate:Normal  01/2020:  1. Left ventricular ejection fraction, by estimation, is 65 to 70%. The  left ventricle has normal function. The left ventricle has no regional  wall motion abnormalities. There is mild concentric left ventricular  hypertrophy. Left ventricular diastolic  function could not be evaluated.  2. Right ventricular systolic function is normal. The right ventricular  size is normal. There is mildly elevated pulmonary artery systolic  pressure. The estimated right ventricular systolic pressure is 74.1 mmHg.  3. Left atrial size was moderately dilated.  4. Right atrial size was mildly dilated.  5. The mitral valve has been repaired/replaced. Mild mitral valve  regurgitation. No evidence of mitral stenosis. The mean mitral valve  gradient is 4.0 mmHg with average heart rate of 62 bpm. There is a 27 mm  porcine present in the mitral position.  6. Tricuspid valve regurgitation is mild to moderate.  7. The aortic valve is normal in structure. Aortic valve regurgitation is  not visualized. No aortic stenosis is present.  8. The inferior vena cava is normal in size with greater than 50%  respiratory variability, suggesting right atrial pressure of 3 mmHg.    Neuro/Psych  Headaches,  PSYCHIATRIC DISORDERS Anxiety Depression    GI/Hepatic hiatal hernia, GERD  ,  Endo/Other  diabetes, Type 2, Insulin Dependent  Renal/GU Renal disease (renal cell cancer)     Musculoskeletal  (+) Arthritis , Fibromyalgia -  Abdominal   Peds  Hematology  (+) anemia ,   Anesthesia Other Findings   Reproductive/Obstetrics                           Anesthesia Physical Anesthesia Plan  ASA: 4  Anesthesia Plan: General   Post-op Pain Management:    Induction: Intravenous  PONV Risk Score and Plan: 2  Airway Management Planned: Oral ETT  Additional Equipment: None  Intra-op Plan:   Post-operative Plan: Extubation in OR  Informed Consent: I have reviewed the patients History and Physical, chart, labs and discussed the procedure including the risks, benefits and alternatives for the proposed anesthesia with the patient or authorized representative who has indicated his/her understanding and acceptance.       Plan Discussed with: CRNA, Anesthesiologist and Surgeon  Anesthesia Plan Comments: (See PAT note 09/07/2020, Konrad Felix, PA-C)     Anesthesia Quick Evaluation

## 2020-09-07 NOTE — Progress Notes (Signed)
Anesthesia Chart Review   Case: [id number] Date/Time: 09/12/20 0830   Procedure: RADIOLOGY WITH ANESTHESIA- RENAL CRYOABLATION (Left)   Anesthesia type: General   Pre-op diagnosis: LEFT RENAL MASS   [facility]   Surgeons: Aletta Edouard, MD       DISCUSSION:70 y.o. every day smoker with h/o sleep apnea, COPD, PAF (Warfarin), carotid artery stenosis s/p right endarterectomy, s/p mitral valve replacement 2012, nocturnal hypoxia with use of 2L O2 at night, DM II (A1C 7.1 08/02/2020), CKD Stage III, left renal mass scheduled for above procedure 09/12/2020 with Dr. Zipporah Plants.   Cardiac cath 2018 with nonobstructive CAD.   Echo 01/17/2020 with Stable bioprosthetic mitral valve, normal LV systolic function.   Pt last seen by pulmonologist 09/06/2020. Per OV note, "ok to proceed with renal ablation.  Needs respiratory observation during recovery."  Per pharmacist note 09/06/2020, "Patient called me and reminded me she has been instructed by her physician who is to procedure her,  to [place] after dose administered tomorrow, Friday, July 29th, 2022 in preparation for a kidney ablation procedure on WEDNESDAY 3-AUG-22." VS: BP (!) 138/46   Pulse (!) 55   Temp 36.8 C (Oral)   Ht _0  (1.575 m)   Wt 73.9 kg   SpO2 96%   BMI 29.81 kg/m   PROVIDERS: Angelica Pou, MD is PCP   Baird Lyons, MD is Pulmonologist  Loralie Champagne, MD is Cardiologist last seen 12/29/2019 LABS: Labs reviewed: Acceptable for surgery. (all labs ordered are listed, but only abnormal results are displayed)  Labs Reviewed  CBC - Abnormal; Notable for the following components:      Result Value   Hemoglobin 11.9 (*)    MCHC 29.8 (*)    RDW 23.6 (*)    All other components within normal limits  BASIC METABOLIC PANEL - Abnormal; Notable for the following components:   Glucose, Bld 176 (*)    All other components within normal limits     IMAGES:   EKG: 07/30/2020 Rate 62  bpm  Sinus rhythm with 1st degree AV block  LAD  CV: Echo 01/17/2020   1. Left ventricular ejection fraction, by estimation, is 65 to 70%. The  left ventricle has normal function. The left ventricle has no regional  wall motion abnormalities. There is mild concentric left ventricular  hypertrophy. Left ventricular diastolic  function could not be evaluated.   2. Right ventricular systolic function is normal. The right ventricular  size is normal. There is mildly elevated pulmonary artery systolic  pressure. The estimated right ventricular systolic pressure is 96.2 mmHg.   3. Left atrial size was moderately dilated.   4. Right atrial size was mildly dilated.   5. The mitral valve has been repaired/replaced. Mild mitral valve  regurgitation. No evidence of mitral stenosis. The mean mitral valve  gradient is 4.0 mmHg with average heart rate of 62 bpm. There is a 27 mm  porcine present in the mitral position.   6. Tricuspid valve regurgitation is mild to moderate.   7. The aortic valve is normal in structure. Aortic valve regurgitation is  not visualized. No aortic stenosis is present.   8. The inferior vena cava is normal in size with greater than 50%  respiratory variability, suggesting right atrial pressure of 3 mmHg.   Cardiac Cath 04/23/2016 1. Elevated right and left heart filling pressures. 2. Moderate to severe pulmonary hypertension.  This appears to be mixed pulmonary venous/pulmonary arterial  hypertension.  Suspect group 2 + group 3 PH, ?component of group 1.  She will have PFTs today, then consideration of selective pulmonary vasodilators if PH appears out of proportion to severity of COPD. Past Medical History:  Diagnosis Date   Abnormality of lung on CXR 02/02/2020   Nonspecific finding on CXR ordered by pulmonologist - c/w inflammation vs infection, f/u imaging suggested, Dr. [name] office has been in communication about recommended next steps.   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   Non-bleeding when visualized on capsule endoscopy 06/30/2015    Arthritis    "lower back; hands" (02/19/2018)   Asthma    Asymptomatic cholelithiasis 09/25/2015   Seen on CT scan 08/2015   Carotid artery stenosis; s/p R endarterectomy    s/p right endarterectomy (06/2010) Carotid US (07/2010):  Left: Moderate-to-severe (60-79%) calcific and non-calcific plaque origin and proximal ICA and ECA    Chronic congestive heart failure with left ventricular diastolic dysfunction (HCC) 10/21/2010   Chronic constipation 02/03/2011   Chronic daily headache 01/16/2014   Chronic iron deficiency anemia    Chronic low back pain 10/06/2012   Chronic venous insufficiency 08/04/2012   COPD (chronic obstructive pulmonary disease) with emphysema (HCC)    PFTs 2018: severe obstructive disease, insignif response to bronchiodilator, mild restriction parenchymal pattern, moderately severe diffusion defect. 2014  FEV1 0.92 (40%), ratio 69, 27% increase in FEV1 with BD, TLC 91%, severe airtrapping, DLCO49% On chronic home O2. Pulmonary rehab referral 05/2012    Dyspnea    Fibromyalgia 08/29/2010   Gastroesophageal reflux disease    History of blood transfusion    "several times"  (02/19/2018)   History of clear cell renal cell carcinoma ([place]), in remission 07/21/2011   s/p cryoablation of left RCC in 09/2011 by [name]. Followed by [name]  [facility]) .     History of hiatal hernia    History of mitral valve replacement with bioprosthetic valve due to mitral stenosis 2012   s/p MVR with a 27-mm pericardial porcine valve (Medtronic Mosaic valve, serial #[id number] on 09/20/10, Dr. Prescott Gum)    History of obstructive sleep apnea, resolved 2013   resolved per sleep study 07/2019; no apnea, but did have desaturation.  CPAP no longer necessary.  Nocturnal  polysomnography (06/2009): Moderate sleep apnea/ hypopnea syndrome , AHI 17.8 per hour with nonpositional hypopneas. CPAP titration to 12 CWP, AHI 2.4 per hour. On nocturnal CPAP via a small resMed Quattro full-face mask with heated humidifier.    History of pneumonia    "once"  (02/19/2018)   History of seborrheic keratosis 09/28/2015   Hyperlipidemia LDL goal < 100 11/20/2005   Internal and external hemorrhoids without complication 63/78/5885   Lichen sclerosus of female genitalia 01/12/2017   Migraine    "none in years" (02/19/2018)   Moderately severe major depression ([place]) 11/19/2005   Nocturnal hypoxia per sleep study 07/2019    Osteoporosis    DEXA 2016: T -2.7; DEXA (12/09/2011): L-spine T -3.7, left hip T -1.4 DEXA (12/2004): L-spine T -2.6, left hip -0.1    Paroxysmal atrial fibrillation ([name]) 10/22/2010   s/p Left atrial maze procedure for paroxysmal atrial fibrillation on 09/20/2010 by Dr Prescott Gum.  Subsequent splenic infarct, decision was made to re-anticoagulate with coumadin, likely life-long as this is the most likely cause of the splenic infarct.    Personal history of colonic  polyps 05/14/2011   Colonoscopy (05/2011): 4 mm adenomatous polyp excised endoscopically Colonoscopy (02/2002): Adenomatous polyp excised endoscopically    Pneumonia    Pulmonary hypertension due to chronic obstructive pulmonary disease ([place]) 04/25/2016   2014 TEE w PA peak pressure 46 mmHg, s/p MV replacement    Right nephrolithiasis, asymptomatic, incidental finding 09/06/2014   5 mm non-obstructing calculus seen on CT scan 09/05/2014    Right ventricular failure ([place]) 04/25/2016   Severe obesity (BMI 35.0-39.9) with comorbidity ([place]) 10/23/2011   Sleep apnea    Tobacco abuse 07/28/2012   Type 2 diabetes mellitus with diabetic neuropathy [facility])     Past Surgical History:  Procedure Laterality Date   CARDIAC CATHETERIZATION     CARDIAC VALVE REPLACEMENT  Aug. 2012   "mitral valve"   CAROTID  ENDARTERECTOMY Right 07/04/2010   by [name] for asymptomatic right carotid artery stenosis   CATARACT EXTRACTION W/ INTRAOCULAR LENS  IMPLANT, BILATERAL Bilateral    CHEST TUBE INSERTION  09/24/2010   Dr Lucianne Lei Trigt   COLONOSCOPY  05/12/2011   performed by Dr. Michail Sermon. Showing small internal hemorrhoids, single tubular adenoma polyp   COLONOSCOPY WITH PROPOFOL N/A 05/29/2020   Procedure: COLONOSCOPY WITH PROPOFOL;  Surgeon: Clarene Essex, MD;  [facility];  Service: Endoscopy;  Laterality: N/A;   CRYOABLATION Left 09/2011   by [name]. Followed by [name]  [facility]) .     DILATION AND CURETTAGE OF UTERUS     ESOPHAGOGASTRODUODENOSCOPY  05/12/2011   performed by Dr. Michail Sermon. Negative for ulcerations, biopsy negative for evidence of celiac sprue   ESOPHAGOGASTRODUODENOSCOPY N/A 06/29/2015   Procedure: ESOPHAGOGASTRODUODENOSCOPY (EGD);  Surgeon: Clarene Essex, MD;  Location: [facility] ENDOSCOPY;  Service: Endoscopy;  Laterality: N/A;   ESOPHAGOGASTRODUODENOSCOPY N/A 03/29/2016   Procedure: ESOPHAGOGASTRODUODENOSCOPY (EGD);  Surgeon: Clarene Essex, MD;  Location: [facility] ENDOSCOPY;  Service: Endoscopy;  Laterality: N/A;   ESOPHAGOGASTRODUODENOSCOPY (EGD) WITH PROPOFOL N/A 05/29/2020   Procedure: ESOPHAGOGASTRODUODENOSCOPY (EGD) WITH PROPOFOL;  Surgeon: Clarene Essex, MD;  [facility];  Service: Endoscopy;  Laterality: N/A;   FRACTURE SURGERY     GIVENS CAPSULE STUDY N/A 06/30/2015   Procedure: GIVENS CAPSULE STUDY;  Surgeon: Clarene Essex, MD;  Location: [place];  Service: Endoscopy;  Laterality: N/A;   GIVENS CAPSULE STUDY N/A 06/29/2015   Procedure: GIVENS CAPSULE STUDY;  Surgeon: Clarene Essex, MD;  Location: [place];  Service: Endoscopy;  Laterality: N/A;   HEMORRHOID SURGERY  1970s?   "lanced"   HYSTEROSCOPY W/ ENDOMETRIAL ABLATION  06/2001   for persistent post-menopausal bleeding // by S. [name], M.D.   IR GENERIC HISTORICAL  08/23/2015   IR RADIOLOGIST EVAL &  MGMT 08/23/2015 Aletta Edouard, MD [facility] INTERV RAD   IR GENERIC HISTORICAL  04/09/2016   IR RADIOLOGIST EVAL & MGMT 04/09/2016 Aletta Edouard, MD [facility] INTERV RAD   IR RADIOLOGIST EVAL & MGMT  10/07/2016   IR RADIOLOGIST EVAL & MGMT  06/25/2017   IR RADIOLOGIST EVAL & MGMT  07/31/2020   LEFT HEART CATH AND CORONARY ANGIOGRAPHY N/A 04/21/2016   Procedure: Left Heart Cath and Coronary Angiography;  Surgeon: Lorretta Harp, MD;  Location: [place] CV LAB;  Service: Cardiovascular;  Laterality: N/A;   LIPOMA EXCISION  08/2005   occipital lipoma 1.5cm - by Dr. [name]   LITHOTRIPSY  ~ 2000   MAZE Left 09/20/10   for paroxysmal atrial fibrillation (Dr. Prescott Gum)   MITRAL VALVE REPLACEMENT  09/20/10    with a 27-mm  pericardial porcine valve (Medtronic Mosaic valve, serial [id number]). 09/20/10, Dr Prescott Gum   ORIF CLAVICLE FRACTURE Right 01/2004   by Thana Farr. [name], M.D for Right clavicle nonunion.; "it's got a pin in it"   POLYPECTOMY  05/29/2020   Procedure: POLYPECTOMY;  Surgeon: Clarene Essex, MD;  [facility];  Service: Endoscopy;;   REFRACTIVE SURGERY Bilateral    RIGHT HEART CATH N/A 04/23/2016   Procedure: Right Heart Cath;  Surgeon: [name], MD;  Location: [place] CV LAB;  Service: Cardiovascular;  Laterality: N/A;   TONSILLECTOMY     TUBAL LIGATION      MEDICATIONS:  albuterol (PROAIR HFA) 108 (90 Base) MCG/ACT inhaler   ALPRAZolam (XANAX) 1 MG tablet   benzonatate (TESSALON) 100 MG capsule   betamethasone dipropionate 0.05 % cream   Blood Glucose Monitoring Suppl (ONETOUCH VERIO) W/DEVICE KIT   buPROPion (WELLBUTRIN XL) 150 MG 24 hr tablet   clobetasol ointment (TEMOVATE) 0.05 %   Continuous Blood Gluc Sensor (FREESTYLE LIBRE 2 SENSOR) MISC   DULoxetine (CYMBALTA) 60 MG capsule   fluticasone (FLONASE) 50 MCG/ACT nasal spray   Fluticasone-Umeclidin-Vilant (TRELEGY ELLIPTA) 100-62.5-25 MCG/INH AEPB   furosemide (LASIX) 80 MG tablet   gabapentin (NEURONTIN) 300  MG capsule   glucose blood (ONETOUCH VERIO) test strip   HYDROcodone-acetaminophen (NORCO/VICODIN) 5-325 MG tablet   Insulin Degludec-Liraglutide (XULTOPHY) 100-3.6 UNIT-MG/ML SOPN   Insulin Pen Needle 32G X 4 MM MISC   Insulin Syringe-Needle U-100 31G X 15/64" 0.5 ML MISC   ipratropium-albuterol (DUONEB) 0.5-2.5 (3) MG/3ML SOLN   Lancets Misc. (ACCU-CHEK FASTCLIX LANCET) KIT   lidocaine (LIDODERM) 5 %   metoprolol succinate (TOPROL-XL) 50 MG 24 hr tablet   nystatin (NYSTATIN) powder   omeprazole (PRILOSEC) 40 MG capsule   OXYGEN   potassium chloride SA (KLOR-CON) 20 MEQ tablet   PRESCRIPTION MEDICATION   rosuvastatin (CRESTOR) 20 MG tablet   warfarin (COUMADIN) 2.5 MG tablet   No current facility-administered medications for this encounter.      Kathryn Felix, PA-C [facility] [phone]

## 2020-09-10 ENCOUNTER — Telehealth: Payer: Self-pay | Admitting: Dietician

## 2020-09-10 ENCOUNTER — Other Ambulatory Visit (HOSPITAL_COMMUNITY): Payer: Self-pay | Admitting: Interventional Radiology

## 2020-09-10 NOTE — Telephone Encounter (Signed)
Has a message for Dr. [name]: She says she is going to the hospital to have her cancer frozen on Wednesday, supposed to be there one night. She wants [place] to know in case it messes her sugars up.

## 2020-09-11 ENCOUNTER — Other Ambulatory Visit: Payer: Self-pay | Admitting: Radiology

## 2020-09-11 LAB — SARS CORONAVIRUS 2 (TAT 6-24 HRS): SARS Coronavirus 2: NEGATIVE

## 2020-09-12 ENCOUNTER — Ambulatory Visit (HOSPITAL_COMMUNITY)
Admission: RE | Admit: 2020-09-12 | Discharge: 2020-09-12 | Disposition: A | Discharge status: Home / Self Care | Payer: HMO | Source: Ambulatory Visit | Attending: Interventional Radiology | Admitting: Interventional Radiology

## 2020-09-12 ENCOUNTER — Ambulatory Visit (HOSPITAL_COMMUNITY): Payer: HMO | Admitting: Physician Assistant

## 2020-09-12 ENCOUNTER — Encounter (HOSPITAL_COMMUNITY): Payer: Self-pay | Admitting: Interventional Radiology

## 2020-09-12 ENCOUNTER — Other Ambulatory Visit (HOSPITAL_COMMUNITY): Payer: Self-pay | Admitting: Cardiology

## 2020-09-12 ENCOUNTER — Ambulatory Visit (HOSPITAL_COMMUNITY): Payer: HMO | Admitting: Anesthesiology

## 2020-09-12 ENCOUNTER — Observation Stay (HOSPITAL_COMMUNITY)
Admission: RE | Admit: 2020-09-12 | Discharge: 2020-09-13 | Disposition: A | Discharge status: Home / Self Care | Payer: HMO | Attending: Interventional Radiology | Admitting: Interventional Radiology

## 2020-09-12 ENCOUNTER — Other Ambulatory Visit: Payer: Self-pay

## 2020-09-12 ENCOUNTER — Encounter (HOSPITAL_COMMUNITY)
Admission: RE | Disposition: A | Discharge status: Home / Self Care | Payer: Self-pay | Source: Home / Self Care | Attending: Interventional Radiology

## 2020-09-12 DIAGNOSIS — Z7901 Long term (current) use of anticoagulants: Secondary | ICD-10-CM | POA: Diagnosis not present

## 2020-09-12 DIAGNOSIS — Z85528 Personal history of other malignant neoplasm of kidney: Secondary | ICD-10-CM | POA: Diagnosis not present

## 2020-09-12 DIAGNOSIS — I480 Paroxysmal atrial fibrillation: Secondary | ICD-10-CM | POA: Insufficient documentation

## 2020-09-12 DIAGNOSIS — Z7982 Long term (current) use of aspirin: Secondary | ICD-10-CM | POA: Insufficient documentation

## 2020-09-12 DIAGNOSIS — Z794 Long term (current) use of insulin: Secondary | ICD-10-CM | POA: Insufficient documentation

## 2020-09-12 DIAGNOSIS — I5032 Chronic diastolic (congestive) heart failure: Secondary | ICD-10-CM | POA: Insufficient documentation

## 2020-09-12 DIAGNOSIS — Z888 Allergy status to other drugs, medicaments and biological substances status: Secondary | ICD-10-CM | POA: Diagnosis not present

## 2020-09-12 DIAGNOSIS — J449 Chronic obstructive pulmonary disease, unspecified: Secondary | ICD-10-CM | POA: Diagnosis not present

## 2020-09-12 DIAGNOSIS — E1140 Type 2 diabetes mellitus with diabetic neuropathy, unspecified: Secondary | ICD-10-CM | POA: Insufficient documentation

## 2020-09-12 DIAGNOSIS — I110 Hypertensive heart disease with heart failure: Secondary | ICD-10-CM | POA: Insufficient documentation

## 2020-09-12 DIAGNOSIS — M810 Age-related osteoporosis without current pathological fracture: Secondary | ICD-10-CM | POA: Diagnosis not present

## 2020-09-12 DIAGNOSIS — Z885 Allergy status to narcotic agent status: Secondary | ICD-10-CM | POA: Insufficient documentation

## 2020-09-12 DIAGNOSIS — Z9981 Dependence on supplemental oxygen: Secondary | ICD-10-CM | POA: Insufficient documentation

## 2020-09-12 DIAGNOSIS — C642 Malignant neoplasm of left kidney, except renal pelvis: Secondary | ICD-10-CM | POA: Diagnosis not present

## 2020-09-12 DIAGNOSIS — N2889 Other specified disorders of kidney and ureter: Secondary | ICD-10-CM | POA: Diagnosis not present

## 2020-09-12 DIAGNOSIS — E785 Hyperlipidemia, unspecified: Secondary | ICD-10-CM | POA: Diagnosis not present

## 2020-09-12 DIAGNOSIS — Z79899 Other long term (current) drug therapy: Secondary | ICD-10-CM | POA: Insufficient documentation

## 2020-09-12 HISTORY — DX: Personal history of other malignant neoplasm of kidney: Z85.528

## 2020-09-12 HISTORY — PX: RADIOLOGY WITH ANESTHESIA: SHX6223

## 2020-09-12 LAB — GLUCOSE, CAPILLARY
Glucose-Capillary: 113 mg/dL — ABNORMAL HIGH (ref 70–99)
Glucose-Capillary: 143 mg/dL — ABNORMAL HIGH (ref 70–99)
Glucose-Capillary: 175 mg/dL — ABNORMAL HIGH (ref 70–99)
Glucose-Capillary: 228 mg/dL — ABNORMAL HIGH (ref 70–99)

## 2020-09-12 LAB — BASIC METABOLIC PANEL
Anion gap: 9 (ref 5–15)
BUN: 21 mg/dL (ref 8–23)
CO2: 32 mmol/L (ref 22–32)
Calcium: 9.7 mg/dL (ref 8.9–10.3)
Chloride: 100 mmol/L (ref 98–111)
Creatinine, Ser: 1.02 mg/dL — ABNORMAL HIGH (ref 0.44–1.00)
GFR, Estimated: 59 mL/min — ABNORMAL LOW (ref >60)
Glucose, Bld: 103 mg/dL — ABNORMAL HIGH (ref 70–99)
Potassium: 4.1 mmol/L (ref 3.5–5.1)
Sodium: 141 mmol/L (ref 135–145)

## 2020-09-12 LAB — CBC WITH DIFFERENTIAL/PLATELET
Abs Immature Granulocytes: 0.02 10*3/uL (ref 0.00–0.07)
Basophils Absolute: 0.1 10*3/uL (ref 0.0–0.1)
Basophils Relative: 1 %
Eosinophils Absolute: 0.1 10*3/uL (ref 0.0–0.5)
Eosinophils Relative: 2 %
HCT: 38.4 % (ref 36.0–46.0)
Hemoglobin: 11.4 g/dL — ABNORMAL LOW (ref 12.0–15.0)
Immature Granulocytes: 0 %
Lymphocytes Relative: 23 %
Lymphs Abs: 1.8 10*3/uL (ref 0.7–4.0)
MCH: 26.2 pg (ref 26.0–34.0)
MCHC: 29.7 g/dL — ABNORMAL LOW (ref 30.0–36.0)
MCV: 88.3 fL (ref 80.0–100.0)
Monocytes Absolute: 0.7 10*3/uL (ref 0.1–1.0)
Monocytes Relative: 9 %
Neutro Abs: 5.1 10*3/uL (ref 1.7–7.7)
Neutrophils Relative %: 65 %
Platelets: 206 10*3/uL (ref 150–400)
RBC: 4.35 MIL/uL (ref 3.87–5.11)
RDW: 23.3 % — ABNORMAL HIGH (ref 11.5–15.5)
WBC: 7.8 10*3/uL (ref 4.0–10.5)
nRBC: 0 % (ref 0.0–0.2)

## 2020-09-12 LAB — PROTIME-INR
INR: 1.1 (ref 0.8–1.2)
Prothrombin Time: 14.3 seconds (ref 11.4–15.2)

## 2020-09-12 LAB — TYPE AND SCREEN
ABO/RH(D): A NEG
Antibody Screen: NEGATIVE

## 2020-09-12 LAB — APTT: aPTT: 27 seconds (ref 24–36)

## 2020-09-12 SURGERY — RADIOLOGY WITH ANESTHESIA
Anesthesia: General | Laterality: Left

## 2020-09-12 MED ORDER — ALPRAZOLAM 0.5 MG PO TABS: 0.5000 mg | ORAL_TABLET | ORAL | Status: DC | PRN

## 2020-09-12 MED ORDER — FENTANYL CITRATE (PF) 100 MCG/2ML IJ SOLN: 25.0000 ug | INTRAMUSCULAR | Status: DC | PRN

## 2020-09-12 MED ORDER — POTASSIUM CHLORIDE CRYS ER 20 MEQ PO TBCR: 20.0000 meq | EXTENDED_RELEASE_TABLET | ORAL | Status: DC

## 2020-09-12 MED ORDER — ACETAMINOPHEN 10 MG/ML IV SOLN
INTRAVENOUS | Status: AC
  Filled 2020-09-12: qty 100

## 2020-09-12 MED ORDER — SUGAMMADEX SODIUM 200 MG/2ML IV SOLN
INTRAVENOUS | Status: DC | PRN
  Administered 2020-09-12: 200 mg via INTRAVENOUS

## 2020-09-12 MED ORDER — TRIAMCINOLONE ACETONIDE 0.5 % EX CREA: TOPICAL_CREAM | Freq: Two times a day (BID) | CUTANEOUS | Status: DC | PRN

## 2020-09-12 MED ORDER — PHENYLEPHRINE HCL-NACL 20-0.9 MG/250ML-% IV SOLN
INTRAVENOUS | Status: AC
  Filled 2020-09-12: qty 250

## 2020-09-12 MED ORDER — UMECLIDINIUM BROMIDE 62.5 MCG/INH IN AEPB
1.0000 | INHALATION_SPRAY | Freq: Every day | RESPIRATORY_TRACT | Status: DC
  Filled 2020-09-12: qty 7

## 2020-09-12 MED ORDER — SODIUM CHLORIDE 0.9 % IV SOLN
INTRAVENOUS | Status: AC
  Filled 2020-09-12: qty 250

## 2020-09-12 MED ORDER — FUROSEMIDE 40 MG PO TABS: 80.0000 mg | ORAL_TABLET | ORAL | Status: DC

## 2020-09-12 MED ORDER — CHLORHEXIDINE GLUCONATE 0.12 % MT SOLN
15.0000 mL | Freq: Once | OROMUCOSAL | Status: AC
  Administered 2020-09-12: 15 mL via OROMUCOSAL

## 2020-09-12 MED ORDER — AMISULPRIDE (ANTIEMETIC) 5 MG/2ML IV SOLN: 10.0000 mg | Freq: Once | INTRAVENOUS | Status: DC | PRN

## 2020-09-12 MED ORDER — LIDOCAINE 2% (20 MG/ML) 5 ML SYRINGE
INTRAMUSCULAR | Status: DC | PRN
  Administered 2020-09-12: 80 mg via INTRAVENOUS

## 2020-09-12 MED ORDER — INSULIN GLARGINE-YFGN 100 UNIT/ML ~~LOC~~ SOLN
40.0000 [IU] | Freq: Every day | SUBCUTANEOUS | Status: DC
  Administered 2020-09-13: 40 [IU] via SUBCUTANEOUS
  Filled 2020-09-12: qty 0.4

## 2020-09-12 MED ORDER — ALBUTEROL SULFATE (2.5 MG/3ML) 0.083% IN NEBU
3.0000 mL | INHALATION_SOLUTION | Freq: Four times a day (QID) | RESPIRATORY_TRACT | Status: DC | PRN
Start: 2020-09-12 — End: 2020-09-13

## 2020-09-12 MED ORDER — METOPROLOL SUCCINATE ER 50 MG PO TB24
50.0000 mg | ORAL_TABLET | Freq: Every day | ORAL | Status: DC
  Administered 2020-09-13: 50 mg via ORAL
  Filled 2020-09-12: qty 1

## 2020-09-12 MED ORDER — LACTATED RINGERS IV SOLN
INTRAVENOUS | Status: DC
Start: 2020-09-12 — End: 2020-09-12

## 2020-09-12 MED ORDER — FUROSEMIDE 40 MG PO TABS
80.0000 mg | ORAL_TABLET | Freq: Every day | ORAL | Status: DC
  Administered 2020-09-13: 80 mg via ORAL
  Filled 2020-09-12: qty 2

## 2020-09-12 MED ORDER — SODIUM CHLORIDE 0.9 % IV SOLN: INTRAVENOUS | Status: DC

## 2020-09-12 MED ORDER — ONDANSETRON HCL 4 MG/2ML IJ SOLN: 4.0000 mg | Freq: Four times a day (QID) | INTRAMUSCULAR | Status: DC | PRN

## 2020-09-12 MED ORDER — FUROSEMIDE 40 MG PO TABS: 40.0000 mg | ORAL_TABLET | ORAL | Status: DC

## 2020-09-12 MED ORDER — ONDANSETRON HCL 4 MG/2ML IJ SOLN: 4.0000 mg | Freq: Once | INTRAMUSCULAR | Status: DC | PRN

## 2020-09-12 MED ORDER — HYDROCODONE-ACETAMINOPHEN 5-325 MG PO TABS
2.0000 | ORAL_TABLET | Freq: Three times a day (TID) | ORAL | Status: DC | PRN
  Administered 2020-09-12 – 2020-09-13 (×3): 2 via ORAL
  Filled 2020-09-12 (×3): qty 2

## 2020-09-12 MED ORDER — IPRATROPIUM-ALBUTEROL 0.5-2.5 (3) MG/3ML IN SOLN: 3.0000 mL | Freq: Four times a day (QID) | RESPIRATORY_TRACT | Status: DC | PRN

## 2020-09-12 MED ORDER — ONDANSETRON HCL 4 MG/2ML IJ SOLN
INTRAMUSCULAR | Status: DC | PRN
  Administered 2020-09-12: 4 mg via INTRAVENOUS

## 2020-09-12 MED ORDER — POTASSIUM CHLORIDE CRYS ER 20 MEQ PO TBCR
40.0000 meq | EXTENDED_RELEASE_TABLET | Freq: Every day | ORAL | Status: DC
  Administered 2020-09-13: 40 meq via ORAL
  Filled 2020-09-12: qty 2

## 2020-09-12 MED ORDER — OMEPRAZOLE 20 MG PO CPDR
40.0000 mg | DELAYED_RELEASE_CAPSULE | Freq: Two times a day (BID) | ORAL | Status: DC
  Administered 2020-09-12 – 2020-09-13 (×2): 40 mg via ORAL
  Filled 2020-09-12 (×2): qty 2

## 2020-09-12 MED ORDER — PANTOPRAZOLE SODIUM 40 MG PO TBEC: 40.0000 mg | DELAYED_RELEASE_TABLET | Freq: Every day | ORAL | Status: DC

## 2020-09-12 MED ORDER — PROPOFOL 10 MG/ML IV BOLUS
INTRAVENOUS | Status: DC | PRN
Start: 2020-09-12 — End: 2020-09-12
  Administered 2020-09-12: 120 mg via INTRAVENOUS

## 2020-09-12 MED ORDER — SENNOSIDES-DOCUSATE SODIUM 8.6-50 MG PO TABS: 1.0000 | ORAL_TABLET | Freq: Every day | ORAL | Status: DC | PRN

## 2020-09-12 MED ORDER — GABAPENTIN 300 MG PO CAPS
600.0000 mg | ORAL_CAPSULE | Freq: Three times a day (TID) | ORAL | Status: DC
  Administered 2020-09-12 – 2020-09-13 (×3): 600 mg via ORAL
  Filled 2020-09-12 (×3): qty 2

## 2020-09-12 MED ORDER — FENTANYL CITRATE (PF) 250 MCG/5ML IJ SOLN
INTRAMUSCULAR | Status: AC
  Filled 2020-09-12: qty 5

## 2020-09-12 MED ORDER — CEFAZOLIN SODIUM-DEXTROSE 2-4 GM/100ML-% IV SOLN
2.0000 g | Freq: Once | INTRAVENOUS | Status: AC
Start: 2020-09-12 — End: 2020-09-12
  Administered 2020-09-12: 2 g via INTRAVENOUS
  Filled 2020-09-12: qty 100

## 2020-09-12 MED ORDER — ROSUVASTATIN CALCIUM 20 MG PO TABS
20.0000 mg | ORAL_TABLET | Freq: Every day | ORAL | Status: DC
Start: 2020-09-12 — End: 2020-09-13
  Administered 2020-09-12: 20 mg via ORAL
  Filled 2020-09-12: qty 1

## 2020-09-12 MED ORDER — ORAL CARE MOUTH RINSE: 15.0000 mL | Freq: Once | OROMUCOSAL | Status: AC

## 2020-09-12 MED ORDER — FLUTICASONE FUROATE-VILANTEROL 100-25 MCG/INH IN AEPB
1.0000 | INHALATION_SPRAY | Freq: Every day | RESPIRATORY_TRACT | Status: DC
  Filled 2020-09-12: qty 28

## 2020-09-12 MED ORDER — LACTATED RINGERS IV SOLN: INTRAVENOUS | Status: DC

## 2020-09-12 MED ORDER — MIDAZOLAM HCL 2 MG/2ML IJ SOLN
INTRAMUSCULAR | Status: DC | PRN
  Administered 2020-09-12: 2 mg via INTRAVENOUS

## 2020-09-12 MED ORDER — FENTANYL CITRATE (PF) 250 MCG/5ML IJ SOLN
INTRAMUSCULAR | Status: DC | PRN
  Administered 2020-09-12 (×2): 50 ug via INTRAVENOUS

## 2020-09-12 MED ORDER — ROCURONIUM BROMIDE 10 MG/ML (PF) SYRINGE
PREFILLED_SYRINGE | INTRAVENOUS | Status: DC | PRN
  Administered 2020-09-12: 70 mg via INTRAVENOUS
  Administered 2020-09-12: 20 mg via INTRAVENOUS
  Administered 2020-09-12: 10 mg via INTRAVENOUS

## 2020-09-12 MED ORDER — FLUTICASONE-UMECLIDIN-VILANT 100-62.5-25 MCG/INH IN AEPB: 1.0000 | INHALATION_SPRAY | Freq: Every day | RESPIRATORY_TRACT | Status: DC

## 2020-09-12 MED ORDER — CLOBETASOL PROPIONATE 0.05 % EX OINT: 1.0000 "application " | TOPICAL_OINTMENT | Freq: Two times a day (BID) | CUTANEOUS | Status: DC | PRN

## 2020-09-12 MED ORDER — INSULIN DEGLUDEC-LIRAGLUTIDE 100-3.6 UNIT-MG/ML ~~LOC~~ SOPN: 40.0000 [IU] | PEN_INJECTOR | Freq: Every day | SUBCUTANEOUS | Status: DC

## 2020-09-12 MED ORDER — PHENYLEPHRINE HCL-NACL 20-0.9 MG/250ML-% IV SOLN
INTRAVENOUS | Status: DC | PRN
  Administered 2020-09-12: 25 ug/min via INTRAVENOUS

## 2020-09-12 MED ORDER — INSULIN ASPART 100 UNIT/ML IJ SOLN
0.0000 [IU] | Freq: Three times a day (TID) | INTRAMUSCULAR | Status: DC
Start: 2020-09-12 — End: 2020-09-13
  Administered 2020-09-12 – 2020-09-13 (×3): 3 [IU] via SUBCUTANEOUS

## 2020-09-12 MED ORDER — DOCUSATE SODIUM 100 MG PO CAPS
100.0000 mg | ORAL_CAPSULE | Freq: Two times a day (BID) | ORAL | Status: DC
  Administered 2020-09-12 – 2020-09-13 (×2): 100 mg via ORAL
  Filled 2020-09-12 (×2): qty 1

## 2020-09-12 MED ORDER — NICOTINE 14 MG/24HR TD PT24
14.0000 mg | MEDICATED_PATCH | Freq: Every day | TRANSDERMAL | Status: DC
  Administered 2020-09-12: 14 mg via TRANSDERMAL

## 2020-09-12 MED ORDER — BENZONATATE 100 MG PO CAPS
100.0000 mg | ORAL_CAPSULE | Freq: Three times a day (TID) | ORAL | Status: DC | PRN
Start: 2020-09-12 — End: 2020-09-13

## 2020-09-12 MED ORDER — ALPRAZOLAM 1 MG PO TABS
1.0000 mg | ORAL_TABLET | Freq: Every evening | ORAL | Status: DC | PRN
  Administered 2020-09-13: 1 mg via ORAL
  Filled 2020-09-12: qty 2

## 2020-09-12 MED ORDER — ACETAMINOPHEN 10 MG/ML IV SOLN
1000.0000 mg | Freq: Once | INTRAVENOUS | Status: AC
  Administered 2020-09-12: 1000 mg via INTRAVENOUS

## 2020-09-12 MED ORDER — BUPROPION HCL ER (XL) 150 MG PO TB24
150.0000 mg | ORAL_TABLET | Freq: Every day | ORAL | Status: DC
  Administered 2020-09-13: 150 mg via ORAL
  Filled 2020-09-12: qty 1

## 2020-09-12 MED ORDER — FLUTICASONE PROPIONATE 50 MCG/ACT NA SUSP
2.0000 | Freq: Every day | NASAL | Status: DC
  Administered 2020-09-12: 2 via NASAL
  Filled 2020-09-12: qty 16

## 2020-09-12 MED ORDER — PHENYLEPHRINE 40 MCG/ML (10ML) SYRINGE FOR IV PUSH (FOR BLOOD PRESSURE SUPPORT)
PREFILLED_SYRINGE | INTRAVENOUS | Status: DC | PRN
  Administered 2020-09-12 (×3): 80 ug via INTRAVENOUS

## 2020-09-12 MED ORDER — MIDAZOLAM HCL 2 MG/2ML IJ SOLN
INTRAMUSCULAR | Status: AC
  Filled 2020-09-12: qty 2

## 2020-09-12 MED ORDER — DULOXETINE HCL 60 MG PO CPEP
60.0000 mg | ORAL_CAPSULE | Freq: Two times a day (BID) | ORAL | Status: DC
  Administered 2020-09-12 – 2020-09-13 (×2): 60 mg via ORAL
  Filled 2020-09-12 (×2): qty 1

## 2020-09-12 NOTE — Progress Notes (Addendum)
Patient ID: [name], female   DOB: 11/23/1950, 70 y.o.   MRN: [id number] Pt doing ok, status post cryoablation of left renal mass earlier today; was given Romazicon post procedure in PACU secondary to drop in O2 sats /obstructive breathing ;has mild sore throat from recent ET tube placement; chronic smoker's cough; denies worsening left flank pain,N/V; last recorded BP at 1442 was 90/58, HR 77, O2 SATS 98% 3 liters N/C; yellow urine in Foley catheter;  for overnight obs, check a.m. labs, follow-up with [name] in [place] clinic in 3 to 4 weeks.  Follow-up imaging in about 3 months

## 2020-09-12 NOTE — Transfer of Care (Addendum)
Immediate Anesthesia Transfer of Care Note  Patient: [name]  Procedure(s) Performed: RADIOLOGY WITH ANESTHESIA- RENAL CRYOABLATION (Left)  Patient Location: PACU  Anesthesia Type:General  Level of Consciousness: awake  Airway & Oxygen Therapy: Patient Spontanous Breathing and Patient connected to face mask oxygen  Post-op Assessment: Report given to RN  Post vital signs: Reviewed and stable  Last Vitals:  Vitals Value Taken Time  BP    Temp    Pulse    Resp    SpO2      Last Pain:  Vitals:   09/12/20 0729  TempSrc:   PainSc: 9       Patients Stated Pain Goal: 9 (72/18/28 8337)  Complications:   See note on Intraprocedure chart.    Arrived to PACU with patient breathing but obstructing.    Jaw thrust, nasal and oral airways applied and ventilation supported with ambu bag.  Dr. Elgie Congo notified.

## 2020-09-12 NOTE — Procedures (Signed)
Interventional Radiology Procedure Note  Procedure: Cryoablation of left renal mass  Anesthesia: General  Complications: None  Estimated Blood Loss: < 10 mL  Findings: 1.4 cm left upper pole renal mass treated with cryoablation using single [employer] CX probe. Hydrodissection via [place] needle with roughly 200 mL of saline with dilute contrast. Procedure performed with [place] and CT guidance.  Plan:  PACU recovery followed by overnight observation.  Venetia Night. Kathlene Cote, M.D Pager:  [phone]

## 2020-09-12 NOTE — Anesthesia Postprocedure Evaluation (Signed)
Anesthesia Post Note  Patient: [name]  Procedure(s) Performed: RADIOLOGY WITH ANESTHESIA- RENAL CRYOABLATION (Left)     Anesthesia Type: General Anesthetic complications: no Comments: Patient arrived to PACU obtunded and dropped her O2 sat. Obstructive breathing pattern ensued. Patient jaw thrusted and then immediately bagged and recovered to normal O2 sat. Flumazenil 0.92m x 2 given with good effect. Maintained O2 sat without assistance so oral and nasal airways removed. Patient responsive. Mild obstruction when not stimulated. RT called to place patient on home CPAP settings. Will continue CPAP until patient consistently alert and maintaining O2 sat on nasal cannula. CNorton Blizzard MD     No notable events documented.  Last Vitals:  Vitals:   09/12/20 0646  BP: (!) 182/56  Pulse: 71  Temp: 36.5 C  SpO2: 95%    Last Pain:  Vitals:   09/12/20 0729  TempSrc:   PainSc: 9                  Candra R Draper Gallon

## 2020-09-12 NOTE — OR Nursing (Signed)
Patient is stable and alert on 3 liters via [place], Dr. Elgie Congo aware and okay for patient to go to the floor.

## 2020-09-12 NOTE — Anesthesia Procedure Notes (Signed)
Procedure Name: Intubation Date/Time: 09/12/2020 9:07 AM Performed by: Lollie Sails, CRNA Pre-anesthesia Checklist: Patient identified, Emergency Drugs available, Suction available, Patient being monitored and Timeout performed Patient Re-evaluated:Patient Re-evaluated prior to induction Oxygen Delivery Method: Circle system utilized Preoxygenation: Pre-oxygenation with 100% oxygen Induction Type: IV induction Ventilation: Mask ventilation without difficulty Laryngoscope Size: Miller and 3 Grade View: Grade I Tube type: Oral Tube size: 7.0 mm Number of attempts: 1 Airway Equipment and Method: Stylet Placement Confirmation: ETT inserted through vocal cords under direct vision, positive ETCO2 and breath sounds checked- equal and bilateral Secured at: 22 cm Tube secured with: Tape Dental Injury: Teeth and Oropharynx as per pre-operative assessment

## 2020-09-12 NOTE — H&P (Signed)
Referring Physician(s): Dahlstedt,S  Supervising Physician: Aletta Edouard  Patient Status:  [facility]   Chief Complaint:  Left renal lesion, prior history renal cell cancer  Subjective: Patient familiar to IR service from prior cryoablation of a left posterior lower pole clear cell renal carcinoma on 09/19/2011 which demonstrated no evidence of recurrence for 5 years after treatment. A small indeterminant left upper pole renal lesion was studied by MRI on 06/25/2017 and measured 7 mm with findings at that time favoring a hemorrhagic cyst.  More recent imaging with screening chest CT in April of this year demonstrated an enlarging cystic lesion of the upper pole of the right kidney which led to a renal ultrasound on 06/08/2020 and MRI of the abdomen on 06/24/2020.  By MRI, there is now a 14 mm rounded lesion in the lateral cortex of the upper pole of the left kidney that demonstrates internal enhancement.  This is suspicious for a slow-growing neoplasm.  The upper pole right renal cyst appears benign.  Previous ablation defect of the lower left kidney appears stable without evidence of carcinoma recurrence. Ms. Klahr is asymptomatic with respect to the renal mass.  She continues to smoke and states that she smokes less than 1 pack of cigarettes per day.  She is using 2 L of oxygen only when sleeping with CPAP.  She is not requiring oxygen during the day while she is awake.  Following recent discussions with [name] she now presents for CT/ultrasound-guided cryoablation of the slowly enlarging left upper pole partially cystic renal lesion.  She currently denies fever, chest pain, abdominal pain, vomiting or bleeding.  She does have headaches, occasional cough, some dyspnea with exertion and back pain as well as intermittent nausea.  Additional medical history as below.  Past Medical History:  Diagnosis Date   Abnormality of lung on CXR 02/02/2020   Nonspecific finding on CXR ordered by  pulmonologist - c/w inflammation vs infection, f/u imaging suggested, Dr. [name] office has been in communication about recommended next steps.   Anxiety 07/24/2010   Aortic atherosclerosis ([place]) 10/19/2014   Seen on CT scan, currently asymptomatic   Arteriovenous malformation of gastrointestinal tract 08/08/2015   Non-bleeding when visualized on capsule endoscopy 06/30/2015    Arthritis    "lower back; hands" (02/19/2018)   Asthma    Asymptomatic cholelithiasis 09/25/2015   Seen on CT scan 08/2015   Carotid artery stenosis; s/p R endarterectomy    s/p right endarterectomy (06/2010) Carotid US (07/2010):  Left: Moderate-to-severe (60-79%) calcific and non-calcific plaque origin and proximal ICA and ECA    Chronic congestive heart failure with left ventricular diastolic dysfunction (HCC) 10/21/2010   Chronic constipation 02/03/2011   Chronic daily headache 01/16/2014   Chronic iron deficiency anemia    Chronic low back pain 10/06/2012   Chronic venous insufficiency 08/04/2012   COPD (chronic obstructive pulmonary disease) with emphysema (HCC)    PFTs 2018: severe obstructive disease, insignif response to bronchiodilator, mild restriction parenchymal pattern, moderately severe diffusion defect. 2014  FEV1 0.92 (40%), ratio 69, 27% increase in FEV1 with BD, TLC 91%, severe airtrapping, DLCO49% On chronic home O2. Pulmonary rehab referral 05/2012    Dyspnea    Fibromyalgia 08/29/2010   Gastroesophageal reflux disease    History of blood transfusion    "several times"  (02/19/2018)   History of clear cell renal cell carcinoma ([place]), in remission 07/21/2011   s/p cryoablation of left RCC in 09/2011 by [name]. Followed by  [name]  [facility]) .     History of hiatal hernia    History of mitral valve replacement with bioprosthetic valve due to mitral stenosis 2012   s/p MVR with a 27-mm pericardial porcine valve (Medtronic Mosaic valve, serial #[id number] on 09/20/10, Dr. Prescott Gum)    History of obstructive sleep apnea, resolved 2013   resolved per sleep study 07/2019; no apnea, but did have desaturation.  CPAP no longer necessary.  Nocturnal polysomnography (06/2009): Moderate sleep apnea/ hypopnea syndrome , AHI 17.8 per hour with nonpositional hypopneas. CPAP titration to 12 CWP, AHI 2.4 per hour. On nocturnal CPAP via a small resMed Quattro full-face mask with heated humidifier.    History of pneumonia    "once"  (02/19/2018)   History of seborrheic keratosis 09/28/2015   Hyperlipidemia LDL goal < 100 11/20/2005   Internal and external hemorrhoids without complication 75/11/2583   Lichen sclerosus of female genitalia 01/12/2017   Migraine    "none in years" (02/19/2018)   Moderately severe major depression ([place]) 11/19/2005   Nocturnal hypoxia per sleep study 07/2019    Osteoporosis    DEXA 2016: T -2.7; DEXA (12/09/2011): L-spine T -3.7, left hip T -1.4 DEXA (12/2004): L-spine T -2.6, left hip -0.1    Paroxysmal atrial fibrillation ([place]) 10/22/2010   s/p Left atrial maze procedure for paroxysmal atrial fibrillation on 09/20/2010 by Dr Prescott Gum.  Subsequent splenic infarct, decision was made to re-anticoagulate with coumadin, likely life-long as this is the most likely cause of the splenic infarct.    Personal history of colonic polyps 05/14/2011   Colonoscopy (05/2011): 4 mm adenomatous polyp excised endoscopically Colonoscopy (02/2002): Adenomatous polyp excised endoscopically    Pneumonia    Pulmonary hypertension due to chronic obstructive pulmonary disease ([place]) 04/25/2016   2014 TEE w PA peak pressure 46 mmHg, s/p MV replacement    Right nephrolithiasis, asymptomatic, incidental finding 09/06/2014   5 mm non-obstructing calculus seen on CT scan 09/05/2014    Right ventricular failure ([place]) 04/25/2016   Severe obesity (BMI 35.0-39.9) with comorbidity ([place]) 10/23/2011   Sleep apnea    Tobacco abuse 07/28/2012   Type 2 diabetes mellitus with diabetic  neuropathy [facility])    Past Surgical History:  Procedure Laterality Date   CARDIAC CATHETERIZATION     CARDIAC VALVE REPLACEMENT  Aug. 2012   "mitral valve"   CAROTID ENDARTERECTOMY Right 07/04/2010   by [name] for asymptomatic right carotid artery stenosis   CATARACT EXTRACTION W/ INTRAOCULAR LENS  IMPLANT, BILATERAL Bilateral    CHEST TUBE INSERTION  09/24/2010   Dr Lucianne Lei Trigt   COLONOSCOPY  05/12/2011   performed by Dr. Michail Sermon. Showing small internal hemorrhoids, single tubular adenoma polyp   COLONOSCOPY WITH PROPOFOL N/A 05/29/2020   Procedure: COLONOSCOPY WITH PROPOFOL;  Surgeon: Clarene Essex, MD;  Location[facility];  Service: Endoscopy;  Laterality: N/A;   CRYOABLATION Left 09/2011   by [name]. Followed by [name]  [facility]) .     DILATION AND CURETTAGE OF UTERUS     ESOPHAGOGASTRODUODENOSCOPY  05/12/2011   performed by Dr. Michail Sermon. Negative for ulcerations, biopsy negative for evidence of celiac sprue   ESOPHAGOGASTRODUODENOSCOPY N/A 06/29/2015   Procedure: ESOPHAGOGASTRODUODENOSCOPY (EGD);  Surgeon: Clarene Essex, MD;  Location: [facility] ENDOSCOPY;  Service: Endoscopy;  Laterality: N/A;   ESOPHAGOGASTRODUODENOSCOPY N/A 03/29/2016   Procedure: ESOPHAGOGASTRODUODENOSCOPY (EGD);  Surgeon: Clarene Essex, MD;  Location: [facility] ENDOSCOPY;  Service: Endoscopy;  Laterality: N/A;   ESOPHAGOGASTRODUODENOSCOPY (EGD) WITH  PROPOFOL N/A 05/29/2020   Procedure: ESOPHAGOGASTRODUODENOSCOPY (EGD) WITH PROPOFOL;  Surgeon: Clarene Essex, MD;  Location[facility];  Service: Endoscopy;  Laterality: N/A;   FRACTURE SURGERY     GIVENS CAPSULE STUDY N/A 06/30/2015   Procedure: GIVENS CAPSULE STUDY;  Surgeon: Clarene Essex, MD;  Location: [place];  Service: Endoscopy;  Laterality: N/A;   GIVENS CAPSULE STUDY N/A 06/29/2015   Procedure: GIVENS CAPSULE STUDY;  Surgeon: Clarene Essex, MD;  Location: [place];  Service: Endoscopy;  Laterality: N/A;   HEMORRHOID SURGERY  1970s?   "lanced"    HYSTEROSCOPY W/ ENDOMETRIAL ABLATION  06/2001   for persistent post-menopausal bleeding // by S. [name], M.D.   IR GENERIC HISTORICAL  08/23/2015   IR RADIOLOGIST EVAL & MGMT 08/23/2015 Aletta Edouard, MD [facility] INTERV RAD   IR GENERIC HISTORICAL  04/09/2016   IR RADIOLOGIST EVAL & MGMT 04/09/2016 Aletta Edouard, MD [facility] INTERV RAD   IR RADIOLOGIST EVAL & MGMT  10/07/2016   IR RADIOLOGIST EVAL & MGMT  06/25/2017   IR RADIOLOGIST EVAL & MGMT  07/31/2020   LEFT HEART CATH AND CORONARY ANGIOGRAPHY N/A 04/21/2016   Procedure: Left Heart Cath and Coronary Angiography;  Surgeon: Lorretta Harp, MD;  Location: [place] CV LAB;  Service: Cardiovascular;  Laterality: N/A;   LIPOMA EXCISION  08/2005   occipital lipoma 1.5cm - by Dr. [name]   LITHOTRIPSY  ~ 2000   MAZE Left 09/20/10   for paroxysmal atrial fibrillation (Dr. Prescott Gum)   MITRAL VALVE REPLACEMENT  09/20/10    with a 27-mm pericardial porcine valve (Medtronic Mosaic valve, serial #[id number]). 09/20/10, Dr Prescott Gum   ORIF CLAVICLE FRACTURE Right 01/2004   by Thana Farr. [name], M.D for Right clavicle nonunion.; "it's got a pin in it"   POLYPECTOMY  05/29/2020   Procedure: POLYPECTOMY;  Surgeon: Clarene Essex, MD;  [facility];  Service: Endoscopy;;   REFRACTIVE SURGERY Bilateral    RIGHT HEART CATH N/A 04/23/2016   Procedure: Right Heart Cath;  Surgeon: [name], MD;  Location: [place] CV LAB;  Service: Cardiovascular;  Laterality: N/A;   TONSILLECTOMY     TUBAL LIGATION         Allergies: Dexlansoprazole, Lorazepam, Oxycontin [oxycodone], and Tramadol hcl  Medications: Prior to Admission medications   Medication Sig Start Date End Date Taking? Authorizing Provider  albuterol (PROAIR HFA) 108 (90 Base) MCG/ACT inhaler Inhale 2 puffs into the lungs every 6 (six) hours as needed for shortness of breath. 11/05/19  Yes Angelica Pou, MD  ALPRAZolam [name]) 1 MG tablet TAKE 1 TABLET BY MOUTH EVERY NIGHT AT  BEDTIME AS NEEDED FOR INSOMNIA. MAY TAKE 1/2 TABLET BY MOUTH DURING THE DAY FOR ANXIETY Patient taking differently: Take 0.5-1 mg by mouth See admin instructions. Take 1 mg at bedtime, may take a 0.5 mg dose during the day as needed for anxiety 08/17/20  Yes Angelica Pou, MD  benzonatate (TESSALON) 100 MG capsule Take 1 capsule (100 mg total) by mouth 3 (three) times daily as needed for cough. 08/02/20  Yes Angelica Pou, MD  betamethasone dipropionate 0.05 % cream Apply topically 2 (two) times daily. Patient taking differently: Apply 1 application topically 2 (two) times daily. 05/31/20  Yes Angelica Pou, MD  buPROPion (WELLBUTRIN XL) 150 MG 24 hr tablet Take 1 tablet (150 mg total) by mouth every morning for 365 doses. 04/12/20 04/12/21 Yes Angelica Pou, MD  clobetasol ointment (TEMOVATE) 0.05 %  Apply 1 application topically 2 (two) times daily as needed (eczema). Patient taking differently: Apply 1 application topically 2 (two) times daily as needed (irritation). 08/21/20  Yes Angelica Pou, MD  DULoxetine (CYMBALTA) 60 MG capsule Take 1 capsule (60 mg total) by mouth 2 (two) times daily. 07/04/20 09/12/20 Yes Angelica Pou, MD  fluticasone [facility]) 50 MCG/ACT nasal spray Place 2 sprays into both nostrils daily. Patient taking differently: Place 2 sprays into both nostrils at bedtime. 02/13/20  Yes Velna Ochs, MD  Fluticasone-Umeclidin-Vilant (TRELEGY ELLIPTA) 100-62.5-25 MCG/INH AEPB Inhale 1 puff into the lungs daily. 02/07/20  Yes Young, Tarri Fuller D, MD  furosemide (LASIX) 80 MG tablet Take 80 mg by mouth as directed. 2m qam, then 475min the afternoon   Yes [provider]  gabapentin (NEURONTIN) 300 MG capsule Take 2 capsules (600 mg total) by mouth 3 (three) times daily. 06/01/20  Yes WiAngelica PouMD  HYDROcodone-acetaminophen (NORCO/VICODIN) 5-325 MG tablet Take 2 tablets by mouth 3 (three) times daily as needed for moderate pain or  severe pain. 08/07/20  Yes WiAngelica PouMD  Insulin Degludec-Liraglutide (XULTOPHY) 100-3.6 UNIT-MG/ML SOPN Inject 40 Units into the skin daily for 30 doses. 07/25/20 09/12/20 Yes WiAngelica PouMD  ipratropium-albuterol (DUONEB) 0.5-2.5 (3) MG/3ML SOLN Take 3 mLs by nebulization every 6 (six) hours as needed for up to 28 days. Patient taking differently: Take 3 mLs by nebulization every 6 (six) hours as needed (shortness of breath, wheezing). 04/17/20 08/28/20 Yes WiAngelica PouMD  lidocaine (LIDODERM) 5 % Place 1 patch onto the skin every 12 (twelve) hours. Remove & Discard patch within 12 hours or as directed by MD Patient taking differently: Place 1-2 patches onto the skin daily as needed (pain). Remove & Discard patch within 12 hours or as directed by MD 12/09/19 12/08/20 Yes WiAngelica PouMD  metoprolol succinate (TOPROL-XL) 50 MG 24 hr tablet Take 1 tablet (50 mg total) by mouth daily. Take with or immediately following a meal. 04/12/19  Yes Clegg, [name] D, NP  nystatin (NYSTATIN) powder Apply 1 application topically 3 (three) times daily as needed. Patient taking differently: Apply 1 application topically 3 (three) times daily as needed (yeast). 04/12/20  Yes WiAngelica PouMD  omeprazole (PRILOSEC) 40 MG capsule Take 1 capsule (40 mg total) by mouth in the morning and at bedtime. 03/07/20 09/12/20 Yes WiAngelica PouMD  OXYGEN Inhale 2 L into the lungs at bedtime.   Yes [provider]  potassium chloride SA (KLOR-CON) 20 MEQ tablet Take 20 mEq by mouth as directed. 2 tabs qam, 1 tab in the afternoon   Yes [provider]  PRESCRIPTION MEDICATION Inhale into the lungs at bedtime. CPAP   Yes [provider]  rosuvastatin (CRESTOR) 20 MG tablet TAKE 1 TABLET BY MOUTH ONCE EVERY NIGHT AT BEDTIME Patient taking differently: Take 20 mg by mouth at bedtime. 06/01/20  Yes WiAngelica PouMD  Blood Glucose Monitoring Suppl (ONETOUCH VERIO)  W/DEVICE KIT 1 each by Does not apply route 4 (four) times daily. 07/25/14   KlOval LinseyMD  Continuous Blood Gluc Sensor (FREESTYLE LIBRE 2 SENSOR) MISC 1 each by Does not apply route 6 (six) times daily. To read 6 times daily with change of sensor every 2 weeks, 90 days worth with 3 refills 02/14/20   WiAngelica PouMD  glucose blood ([facility]) test strip Use to check blood sugar up to 7 times weekly.  diag code E11.40. Insulin dependent 03/01/20   Angelica Pou, MD  Insulin Pen Needle 32G X 4 MM MISC Use to inject insulin up to 6 times a day 08/04/17   [name], MD  Insulin Syringe-Needle U-100 31G X 15/64" 0.5 ML MISC Use to inject insulin up to 4 times a day 04/17/16   [name], MD  Lancets Misc. (ACCU-CHEK FASTCLIX LANCET) KIT Check your blood 4 times a day dx code 250.00 insulin requiring 01/19/13   [name], MD  warfarin (COUMADIN) 2.5 MG tablet Take 2.5 mg by mouth as directed.    [provider]     Vital Signs: BP (!) 182/56   Pulse 71   Temp 97.7 F (36.5 C) (Oral)   Ht _0  (1.575 m)   Wt 165 lb 2 oz (74.9 kg)   SpO2 95%   BMI 30.20 kg/m   Physical Exam patient awake, alert.  Chest with distant breath sounds bilaterally.  Heart with regular rate and rhythm.  Abdomen soft, positive bowel sounds, nontender.  No significant lower extremity edema but erythema noted of both tibial/foot regions, NT  Imaging: No results found.  Labs:  CBC: Recent Labs    04/18/20 1555 08/02/20 1024 08/19/20 0050 09/04/20 0937  WBC 7.6 7.0 7.4 7.5  HGB 9.6* 7.8* 9.3* 11.9*  HCT 29.8* 27.4* 31.6* 40.0  PLT 216.0 180 179 260    COAGS: Recent Labs    08/18/20 1603 08/19/20 0050  INR 1.6* 1.6*    BMP: Recent Labs    09/26/19 1317 12/29/19 1231 01/09/20 1338 04/12/20 1139 08/02/20 1024 08/07/20 1129 08/19/20 0050 09/04/20 0937  NA 135   < > 137   < > 138 136 135 138  K 4.2   < > 4.0   < > 4.7 4.0 4.1 4.8  CL 98   < > 97*    < > 101 97* 101 101  CO2 25   < > 30   < > _1 GLUCOSE 276*   < > 172*   < > 188* 197* 136* 176*  BUN 16   < > 18   < > _2 CALCIUM 9.1   < > 9.3   < > 9.4 9.3 9.0 9.5  CREATININE 1.11*   < > 1.23*   < > 1.05* 1.09* 1.10* 0.87  GFRNONAA 50*   < > 47*  --   --  54* 54* >60  GFRAA 58*  --   --   --   --   --   --   --    < > = values in this interval not displayed.    LIVER FUNCTION TESTS: Recent Labs    09/26/19 1317 04/18/20 1555  BILITOT 0.6 0.3  AST 15 10  ALT 12 6  ALKPHOS 60 45  PROT 6.8 6.7  ALBUMIN 3.7 3.8    Assessment and Plan: Patient with history of multiple medical problems including AVM of GI tract, carotid artery stenosis with prior endarterectomy, congestive heart failure, iron deficiency anemia, chronic venous insufficiency, COPD, fibromyalgia, GERD, left renal cell carcinoma with prior cryoablation in 2013, mitral valve replacement with bioprosthetic valve, obstructive sleep apnea, hyperlipidemia, migraine headaches, anxiety/depression, osteoporosis, paroxysmal atrial fibrillation with prior Maze procedure and subsequent splenic infarct, pulmonary hypertension, nephrolithiasis, obesity, tobacco abuse and diabetes.  She now presents with an enlarging left upper pole partially cystic renal lesion concerning for neoplasm.  Following  recent discussions with [name] she presents today for CT/ultrasound-guided cryoablation of the left upper pole renal lesion.  Details/risks of procedure, including but not limited to, internal bleeding, infection, injury to adjacent structures, anesthesia related complications discussed with patient with her understanding and consent.   Electronically Signed: D. Rowe Robert, PA-C 09/12/2020, 7:57 AM   I spent a total of 30 minutes at the the patient's bedside AND on the patient's hospital floor or unit, greater than 50% of which was counseling/coordinating care for

## 2020-09-12 NOTE — Sedation Documentation (Signed)
Anesthesia in to sedate and monitor.

## 2020-09-12 NOTE — OR Nursing (Signed)
Patient came out of OR with CRNA and nurse at bedside giving report when patient began to obstruct.  Airways inserted by CRNA, Ambu bag used with jaw thrust patient's oxygen saturation began to increase back to 90s.  Dr. Elgie Congo at bedside assisting and she order romazicon and [place].  Patient responding and respiratory at bedside applying [place].

## 2020-09-13 ENCOUNTER — Encounter (HOSPITAL_COMMUNITY): Payer: Self-pay | Admitting: Interventional Radiology

## 2020-09-13 ENCOUNTER — Other Ambulatory Visit: Payer: Self-pay | Admitting: Radiology

## 2020-09-13 ENCOUNTER — Other Ambulatory Visit: Payer: Self-pay | Admitting: Internal Medicine

## 2020-09-13 DIAGNOSIS — J439 Emphysema, unspecified: Secondary | ICD-10-CM

## 2020-09-13 DIAGNOSIS — N2889 Other specified disorders of kidney and ureter: Secondary | ICD-10-CM

## 2020-09-13 DIAGNOSIS — I480 Paroxysmal atrial fibrillation: Secondary | ICD-10-CM

## 2020-09-13 DIAGNOSIS — K219 Gastro-esophageal reflux disease without esophagitis: Secondary | ICD-10-CM

## 2020-09-13 DIAGNOSIS — K449 Diaphragmatic hernia without obstruction or gangrene: Secondary | ICD-10-CM

## 2020-09-13 DIAGNOSIS — M797 Fibromyalgia: Secondary | ICD-10-CM

## 2020-09-13 DIAGNOSIS — B372 Candidiasis of skin and nail: Secondary | ICD-10-CM

## 2020-09-13 DIAGNOSIS — F419 Anxiety disorder, unspecified: Secondary | ICD-10-CM

## 2020-09-13 LAB — BASIC METABOLIC PANEL
Anion gap: 7 (ref 5–15)
BUN: 19 mg/dL (ref 8–23)
CO2: 28 mmol/L (ref 22–32)
Calcium: 9 mg/dL (ref 8.9–10.3)
Chloride: 106 mmol/L (ref 98–111)
Creatinine, Ser: 1.01 mg/dL — ABNORMAL HIGH (ref 0.44–1.00)
GFR, Estimated: 60 mL/min — ABNORMAL LOW (ref >60)
Glucose, Bld: 174 mg/dL — ABNORMAL HIGH (ref 70–99)
Potassium: 4.2 mmol/L (ref 3.5–5.1)
Sodium: 141 mmol/L (ref 135–145)

## 2020-09-13 LAB — CBC
HCT: 37.5 % (ref 36.0–46.0)
Hemoglobin: 11 g/dL — ABNORMAL LOW (ref 12.0–15.0)
MCH: 27 pg (ref 26.0–34.0)
MCHC: 29.3 g/dL — ABNORMAL LOW (ref 30.0–36.0)
MCV: 92.1 fL (ref 80.0–100.0)
Platelets: 196 10*3/uL (ref 150–400)
RBC: 4.07 MIL/uL (ref 3.87–5.11)
RDW: 23.6 % — ABNORMAL HIGH (ref 11.5–15.5)
WBC: 8.8 10*3/uL (ref 4.0–10.5)
nRBC: 0 % (ref 0.0–0.2)

## 2020-09-13 LAB — GLUCOSE, CAPILLARY
Glucose-Capillary: 155 mg/dL — ABNORMAL HIGH (ref 70–99)
Glucose-Capillary: 185 mg/dL — ABNORMAL HIGH (ref 70–99)

## 2020-09-13 NOTE — Telephone Encounter (Signed)
Refill Request-     albuterol (PROAIR HFA) 108 (90 Base) MCG/ACT inhaler  metoprolol succinate (TOPROL-XL) 50 MG 24 hr tablet  nystatin (NYSTATIN) powder  clobetasol ointment (TEMOVATE) 0.05 %    ALPRAZolam (XANAX) tablet 1 mg   [facility] [place] [facility] [place] [place] [facility] [place]

## 2020-09-13 NOTE — Progress Notes (Signed)
Per PT- sent DPI back to Pharmacy for credit.

## 2020-09-13 NOTE — Discharge Summary (Signed)
Physician Discharge Summary      Patient ID: [name] MRN: [id number] DOB/AGE: [birth year] 70 y.o.  Admit date: 09/12/2020 Discharge date: 09/13/2020  Admission Diagnoses: Active Problems:   Left renal mass  Discharge Diagnoses:  Active Problems:   Left renal mass    Procedures: Procedure(s): RADIOLOGY WITH ANESTHESIA- LEFT RENAL CRYOABLATION  Discharged Condition: good  Hospital Course: Admitted 8/3 after successful CT guided left renal mass cryoablation. To floor in stable condition. No overnight complications. Foley removed. Pt tolerated diet, has been OOB to chair. No significant pain at procedure site, just her typical fibromyalgia and neuropathy pains. Labs reviewed Stable for discharge. All medications, instructions, and follow up plans reviewed.  Consults: None   Discharge Exam: Blood pressure (!) 103/52, pulse 66, temperature (!) 97.4 F (36.3 C), temperature source Oral, resp. rate 16, height _0  (1.575 m), weight 74.9 kg, SpO2 95 %. General: NAD Lungs: CTA without w/r/r Heart: Regular Abdomen:soft, NT, left flank puncture site clean, dry, no hematoma   Disposition: Discharge disposition: 01-Home or Self Care       Discharge Instructions     Call MD for:  persistant nausea and vomiting   Complete by: As directed    Call MD for:  redness, tenderness, or signs of infection (pain, swelling, redness, odor or green/yellow discharge around incision site)   Complete by: As directed    Call MD for:  severe uncontrolled pain   Complete by: As directed    Call MD for:  temperature >100.4   Complete by: As directed    Diet - low sodium heart healthy   Complete by: As directed    Increase activity slowly   Complete by: As directed    May shower / Bathe   Complete by: As directed    Remove dressing in 24 hours   Complete by: As directed       Allergies as of 09/13/2020       Reactions   Dexlansoprazole Other (See Comments)    Unknown reaction   Lorazepam Other (See Comments)   Patient's sister noted that ativan caused the patient to become extremely confused during hospitalization 09/2010; tolerates Xanax   Oxycontin [oxycodone] Other (See Comments)   headache   Tramadol Hcl Swelling   Ankle swelling        Medication List     TAKE these medications    Accu-Chek FastClix Lancet Kit Check your blood 4 times a day dx code 250.00 insulin requiring   albuterol 108 (90 Base) MCG/ACT inhaler Commonly known as: ProAir HFA Inhale 2 puffs into the lungs every 6 (six) hours as needed for shortness of breath.   ALPRAZolam 1 MG tablet Commonly known as: XANAX TAKE 1 TABLET BY MOUTH EVERY NIGHT AT BEDTIME AS NEEDED FOR INSOMNIA. MAY TAKE 1/2 TABLET BY MOUTH DURING THE DAY FOR ANXIETY What changed: See the new instructions.   benzonatate 100 MG capsule Commonly known as: TESSALON Take 1 capsule (100 mg total) by mouth 3 (three) times daily as needed for cough.   betamethasone dipropionate 0.05 % cream Apply topically 2 (two) times daily. What changed: how much to take   buPROPion 150 MG 24 hr tablet Commonly known as: Wellbutrin XL Take 1 tablet (150 mg total) by mouth every morning for 365 doses.   clobetasol ointment 0.05 % Commonly known as: TEMOVATE Apply 1 application topically 2 (two) times daily as needed (eczema). What changed: reasons to take this   DULoxetine 60  MG capsule Commonly known as: CYMBALTA Take 1 capsule (60 mg total) by mouth 2 (two) times daily.   fluticasone 50 MCG/ACT nasal spray Commonly known as: FLONASE Place 2 sprays into both nostrils daily. What changed: when to take this   FreeStyle Libre 2 Sensor Misc 1 each by Does not apply route 6 (six) times daily. To read 6 times daily with change of sensor every 2 weeks, 90 days worth with 3 refills   furosemide 80 MG tablet Commonly known as: LASIX TAKE 1 TABLET BY MOUTH EVERY MORNING AND TAKE 1/2 TABLET EVERY EVENING.  NEED APPOINTMENT FOR FURTHER REFILL What changed: See the new instructions.   gabapentin 300 MG capsule Commonly known as: NEURONTIN Take 2 capsules (600 mg total) by mouth 3 (three) times daily.   HYDROcodone-acetaminophen 5-325 MG tablet Commonly known as: NORCO/VICODIN Take 2 tablets by mouth 3 (three) times daily as needed for moderate pain or severe pain.   Insulin Pen Needle 32G X 4 MM Misc Use to inject insulin up to 6 times a day   Insulin Syringe-Needle U-100 31G X 15/64" 0.5 ML Misc Use to inject insulin up to 4 times a day   ipratropium-albuterol 0.5-2.5 (3) MG/3ML Soln Commonly known as: DUONEB Take 3 mLs by nebulization every 6 (six) hours as needed for up to 28 days. What changed: reasons to take this   lidocaine 5 % Commonly known as: Lidoderm Place 1 patch onto the skin every 12 (twelve) hours. Remove & Discard patch within 12 hours or as directed by MD What changed:  how much to take when to take this reasons to take this   metoprolol succinate 50 MG 24 hr tablet Commonly known as: TOPROL-XL Take 1 tablet (50 mg total) by mouth daily. Take with or immediately following a meal.   nystatin powder Commonly known as: nystatin Apply 1 application topically 3 (three) times daily as needed. What changed: reasons to take this   omeprazole 40 MG capsule Commonly known as: PRILOSEC Take 1 capsule (40 mg total) by mouth in the morning and at bedtime.   OneTouch Verio test strip Generic drug: glucose blood Use to check blood sugar up to 7 times weekly. diag code E11.40. Insulin dependent   OneTouch Verio w/Device Kit 1 each by Does not apply route 4 (four) times daily.   OXYGEN Inhale 2 L into the lungs at bedtime.   potassium chloride SA 20 MEQ tablet Commonly known as: KLOR-CON Take 20 mEq by mouth as directed. 2 tabs qam, 1 tab in the afternoon   PRESCRIPTION MEDICATION Inhale into the lungs at bedtime. CPAP   rosuvastatin 20 MG tablet Commonly  known as: CRESTOR TAKE 1 TABLET BY MOUTH ONCE EVERY NIGHT AT BEDTIME What changed:  how much to take how to take this when to take this additional instructions   Trelegy Ellipta 100-62.5-25 MCG/INH Aepb Generic drug: Fluticasone-Umeclidin-Vilant Inhale 1 puff into the lungs daily.   warfarin 2.5 MG tablet Commonly known as: COUMADIN Take 2.5 mg by mouth as directed.   Xultophy 100-3.6 UNIT-MG/ML Sopn Generic drug: Insulin Degludec-Liraglutide Inject 40 Units into the skin daily for 30 doses.        Follow-up Information     Aletta Edouard, MD. Go in 3 week(s).   Specialties: Interventional Radiology, Radiology Why: Clinic will call you to schedule follow up Contact information: [address] [place] [address] [phone]  SignedAscencion Dike PA-C 09/13/2020, 10:06 AM

## 2020-09-13 NOTE — Progress Notes (Signed)
Foley catheter removed at about 11:30 pm, pt had 200 cc of urine in foley, had a wet pad at about 3 am, pt was transferred to [facility] at 6 am, she had 100 cc of clear yellow urine, bladder scan done, PVR 228 ml, reported off to AM nurse.

## 2020-09-17 ENCOUNTER — Other Ambulatory Visit: Payer: Self-pay | Admitting: Internal Medicine

## 2020-09-17 ENCOUNTER — Ambulatory Visit: Payer: HMO | Admitting: Behavioral Health

## 2020-09-17 DIAGNOSIS — M5442 Lumbago with sciatica, left side: Secondary | ICD-10-CM

## 2020-09-17 DIAGNOSIS — G8929 Other chronic pain: Secondary | ICD-10-CM

## 2020-09-17 DIAGNOSIS — F419 Anxiety disorder, unspecified: Secondary | ICD-10-CM

## 2020-09-17 DIAGNOSIS — F331 Major depressive disorder, recurrent, moderate: Secondary | ICD-10-CM

## 2020-09-17 MED ORDER — OMEPRAZOLE 40 MG PO CPDR: 40.0000 mg | DELAYED_RELEASE_CAPSULE | Freq: Two times a day (BID) | ORAL | 1 refills | Status: DC

## 2020-09-17 MED ORDER — CLOBETASOL PROPIONATE 0.05 % EX OINT: 1.0000 "application " | TOPICAL_OINTMENT | Freq: Two times a day (BID) | CUTANEOUS | 0 refills | Status: DC | PRN

## 2020-09-17 MED ORDER — METOPROLOL SUCCINATE ER 50 MG PO TB24: 50.0000 mg | ORAL_TABLET | Freq: Every day | ORAL | 3 refills | Status: DC

## 2020-09-17 MED ORDER — ALBUTEROL SULFATE HFA 108 (90 BASE) MCG/ACT IN AERS
2.0000 | INHALATION_SPRAY | Freq: Four times a day (QID) | RESPIRATORY_TRACT | 5 refills | Status: DC | PRN
  Filled 2021-04-25: qty 18, 17d supply, fill #0
  Filled 2021-05-06 – 2021-06-20 (×2): qty 18, 25d supply, fill #0
  Filled 2021-09-07: qty 6.7, 25d supply, fill #1

## 2020-09-17 MED ORDER — NYSTATIN 100000 UNIT/GM EX POWD: 1.0000 "application " | Freq: Three times a day (TID) | CUTANEOUS | 4 refills | Status: DC | PRN

## 2020-09-17 MED ORDER — ALPRAZOLAM 1 MG PO TABS: ORAL_TABLET | ORAL | 0 refills | Status: DC

## 2020-09-17 MED ORDER — DULOXETINE HCL 60 MG PO CPEP: 60.0000 mg | ORAL_CAPSULE | Freq: Two times a day (BID) | ORAL | 3 refills | Status: DC

## 2020-09-17 NOTE — BH Specialist Note (Signed)
Integrated Behavioral Health via Telemedicine Visit  09/17/2020 [name] [id number]  Number of Integrated Behavioral Health visits: 10 Session Start time: 9:30am  Session End time: 10:00am Total time: 30  Referring Provider: Dr. Dorian Pod, MD Patient/Family location: Pt @ home in private [facility] Provider location: [facility] All persons participating in visit: Pt & Clinician Types of Service: Individual psychotherapy  I connected with [name] and/or [name]'s  self  via  Telephone or Video Enabled Telemedicine Application  (Video is Caregility application) and verified that I am speaking with the correct person using two identifiers. Discussed confidentiality:  10th visit  I discussed the limitations of telemedicine and the availability of in person appointments.  Discussed there is a possibility of technology failure and discussed alternative modes of communication if that failure occurs.  I discussed that engaging in this telemedicine visit, they consent to the provision of behavioral healthcare and the services will be billed under their insurance.  Patient and/or legal guardian expressed understanding and consented to Telemedicine visit:  10th visit  Presenting Concerns: Patient and/or family reports the following symptoms/concerns: reduced pain from surgery Duration of problem: months; Severity of problem: mild  Patient and/or Family's Strengths/Protective Factors: Social connections, Social and Emotional competence, Concrete supports in place (healthy food, safe environments, etc.), and Sense of purpose  Goals Addressed: Patient will:  Reduce symptoms of: anxiety and depression   Increase knowledge and/or ability of: coping skills and stress reduction   Demonstrate ability to: Increase healthy adjustment to current life circumstances  Progress towards Goals: Ongoing  Interventions: Interventions utilized:  Solution-Focused  Strategies and Supportive Counseling Standardized Assessments completed:  screeners prn  Patient and/or Family Response: Pt receptive to call today & requests future appt  Assessment: Patient currently experiencing reduction in post-surgical pain since release last Thur's surgery. Pt reports she made a call to [phone] last Thur or Fri night. This call helped ease her pain.   Patient may benefit from cont'd support for mental health wellness.  Plan: Follow up with behavioral health clinician on : 2-3 wks for f/u telehealth for 30 min Behavioral recommendations: Re-esbtment of baseline prior to surgery Referral(s): [place] (In Clinic)  I discussed the assessment and treatment plan with the patient and/or parent/guardian. They were provided an opportunity to ask questions and all were answered. They agreed with the plan and demonstrated an understanding of the instructions.   They were advised to call back or seek an in-person evaluation if the symptoms worsen or if the condition fails to improve as anticipated.  Kathryn Hutching, LMFT

## 2020-09-17 NOTE — Telephone Encounter (Signed)
Last rx written 08/07/20. Last OV 08/02/20. Next OV 09/27/20. UDS 12/16/18.

## 2020-09-17 NOTE — Telephone Encounter (Signed)
Refill Request   HYDROcodone-acetaminophen (NORCO/VICODIN) 5-325 MG tablet   [id number] - [place], [place] - [address] AT [address] (Ph: [phone])

## 2020-09-17 NOTE — Telephone Encounter (Signed)
Pt calling back as her medications have not been refilled per her pharmacy.  Please call the patient back.

## 2020-09-18 MED ORDER — HYDROCODONE-ACETAMINOPHEN 5-325 MG PO TABS: 2.0000 | ORAL_TABLET | Freq: Three times a day (TID) | ORAL | 0 refills | Status: DC | PRN

## 2020-09-25 ENCOUNTER — Telehealth: Payer: Self-pay

## 2020-09-25 NOTE — Telephone Encounter (Signed)
Received fax INR results from [facility] with most recent INR results from 09/24/2020 of 2.0 Will send in-basket message to Dr. Elie Confer and place hard copy results on his desk. SChaplin, RN,BSN

## 2020-09-26 ENCOUNTER — Telehealth: Payer: Self-pay

## 2020-09-26 NOTE — Assessment & Plan Note (Signed)
09/12/20 successful CT guided left renal mass cryoablation - [name].

## 2020-09-26 NOTE — Telephone Encounter (Signed)
Pt is requesting a call back .. she has an appt tomorrow 8/18 but she is wanting to know what her vitals are to be because she is wanting to start using her exercise bike .

## 2020-09-26 NOTE — Telephone Encounter (Signed)
RTC, patient states she now has an exercise bike and will start using it.  She is asking what her VS should be when she is exercising.  RN congratulated patient on starting an exercise program and agreed with patient that it is important to check with your physician before starting any exercise program.  She states she has a home b/p cuff, but does not know how to use it.  She was instructed to bring her cuff to her appt tomorrow and staff could provide assistance.  She was informed that Dr. [name] will discuss how much she should exercise/push herself at tomorrow's appt, she verbalized understanding. She also states she is interested in discussing DNA testing as it relates to medication reactions (states she got a message in [place]).  Pt informed to speak to MD about this tomorrow. Thank you, SChaplin, RN,BSN

## 2020-09-27 ENCOUNTER — Encounter: Payer: Self-pay | Admitting: Internal Medicine

## 2020-09-27 ENCOUNTER — Ambulatory Visit (INDEPENDENT_AMBULATORY_CARE_PROVIDER_SITE_OTHER): Payer: HMO | Admitting: Internal Medicine

## 2020-09-27 VITALS — BP 133/38 | HR 56 | Temp 98.6°F | Ht 62.0 in | Wt 164.6 lb

## 2020-09-27 DIAGNOSIS — M545 Low back pain, unspecified: Secondary | ICD-10-CM | POA: Diagnosis not present

## 2020-09-27 DIAGNOSIS — F419 Anxiety disorder, unspecified: Secondary | ICD-10-CM

## 2020-09-27 DIAGNOSIS — N289 Disorder of kidney and ureter, unspecified: Secondary | ICD-10-CM | POA: Diagnosis not present

## 2020-09-27 DIAGNOSIS — B351 Tinea unguium: Secondary | ICD-10-CM | POA: Diagnosis not present

## 2020-09-27 DIAGNOSIS — Z720 Tobacco use: Secondary | ICD-10-CM

## 2020-09-27 DIAGNOSIS — I480 Paroxysmal atrial fibrillation: Secondary | ICD-10-CM

## 2020-09-27 DIAGNOSIS — I10 Essential (primary) hypertension: Secondary | ICD-10-CM | POA: Diagnosis not present

## 2020-09-27 DIAGNOSIS — I5032 Chronic diastolic (congestive) heart failure: Secondary | ICD-10-CM

## 2020-09-27 DIAGNOSIS — J449 Chronic obstructive pulmonary disease, unspecified: Secondary | ICD-10-CM

## 2020-09-27 DIAGNOSIS — Z794 Long term (current) use of insulin: Secondary | ICD-10-CM | POA: Diagnosis not present

## 2020-09-27 DIAGNOSIS — D509 Iron deficiency anemia, unspecified: Secondary | ICD-10-CM

## 2020-09-27 DIAGNOSIS — N2889 Other specified disorders of kidney and ureter: Secondary | ICD-10-CM

## 2020-09-27 DIAGNOSIS — M79675 Pain in left toe(s): Secondary | ICD-10-CM

## 2020-09-27 DIAGNOSIS — I739 Peripheral vascular disease, unspecified: Secondary | ICD-10-CM

## 2020-09-27 DIAGNOSIS — I2723 Pulmonary hypertension due to lung diseases and hypoxia: Secondary | ICD-10-CM

## 2020-09-27 DIAGNOSIS — M5441 Lumbago with sciatica, right side: Secondary | ICD-10-CM

## 2020-09-27 DIAGNOSIS — M79674 Pain in right toe(s): Secondary | ICD-10-CM

## 2020-09-27 DIAGNOSIS — E1140 Type 2 diabetes mellitus with diabetic neuropathy, unspecified: Secondary | ICD-10-CM

## 2020-09-27 DIAGNOSIS — Z85528 Personal history of other malignant neoplasm of kidney: Secondary | ICD-10-CM | POA: Diagnosis not present

## 2020-09-27 DIAGNOSIS — M5442 Lumbago with sciatica, left side: Secondary | ICD-10-CM

## 2020-09-27 DIAGNOSIS — G8929 Other chronic pain: Secondary | ICD-10-CM

## 2020-09-27 NOTE — Patient Instructions (Signed)
Ms. Bre, [place] to see you today, as always.  So much has happened these last several months, I'm hoping you'll enjoy some smooth sailing for awhile!  We talked about PT and about increasing your pain medicine on some days to a 3rd dose which you'll take in the evenings.    We talked about the importance now of going to see Dr. Gwenlyn Found, your circulation doctor.  We discussed changing some wording on your medicine list so that it reflects how you are taking your medicines.  We talked about how you will begin using your peddling equiptment AS TOLERATED.  I'm excited about his!  It will help your leg circulation, among other things.  Let's get together again in a month or so.  We need to further discuss the jitters you've been feeling.  Take care and stay well and keep rocking those pretty colors!  Dr. [name]

## 2020-09-27 NOTE — Progress Notes (Signed)
[name] is here for routine close f/u of multiple chronic conditions.  Since last visit, she has seen her cardiologist 08/07/20 for monitoring of CHF (euvolemic, no change in management), was admitted 08/18/20 under observation status for blood and iron transfusions for recurrent symptomatic anemia ( no obvious bleeding source but AVMs suspected), saw her pulmonologist 09/06/20 for preop evaluation prior to her renal mass ablation (no change in management) and was admitted overnight 09/12/20 for recurrent L RCC cryoablation.  She did not have a good experience and is glad to have it behind her.    Since that time, she is experiencing ongoing chronic pain in in LB and legs.  Not interested in walking assistive device, "not ready to go there yet".   Often doesn't have energy to get to the kitchen to make something to eat.  Today doesn't have energy to get hair cut.  Spending a lot of time in the bed.  Also feeling jittery inside and out.  Going on a long time.  Chain smoking has resumed.  Sleep-wake cycle ruined -  she naps on/off throughout a 24 hr period.  Sometimes has used her pain pills tid, and it helps, but she doesn't want to do this consistently "it's just a lot of pills"  Needs extra per refill if she is permitted to use tid prn.  Typically takes first dose 3-6 am, second dose 9-12 (midday). Sometimes takes a third dose in the evening/bedtime. 1 of 4 days may need this.    Just bought a seated stationary peddling device!  Plans to start.  Interested in [place] to maximize mobility. Also interested in HHA or some sort of  personal care. Does insurance cover HHA?   BP (!) 133/38 (BP Location: Right Arm, Patient Position: Sitting, Cuff Size: Small)   Pulse (!) 56   Temp 98.6 F (37 C) (Oral)   Ht _0  (1.575 m)   Wt 164 lb 9.6 oz (74.7 kg)   SpO2 100%   BMI 30.11 kg/m   Mycotic toenails trimmed.  Feet with dependent rubor, cool, pulseless, no wounds.  This is unchanged.  Assessment and plan  (see also problem based documentation)  Change alprazolam to a every night (not prn) to reflect how she is taking it.  Refer to Dr. Gwenlyn Found.  Betamethasone bid continue for lichen sclerosis.  Change 2nd dose of lasix to afternoon to match K.  Lidocaine patch - every 12 hrs as needed, may place two patches as needed.  Nystatin powder RF  Change warfarin to reflect Dr. Gertie Gowda last note

## 2020-09-28 DIAGNOSIS — I050 Rheumatic mitral stenosis: Secondary | ICD-10-CM | POA: Diagnosis not present

## 2020-09-28 DIAGNOSIS — J449 Chronic obstructive pulmonary disease, unspecified: Secondary | ICD-10-CM | POA: Diagnosis not present

## 2020-09-28 DIAGNOSIS — I509 Heart failure, unspecified: Secondary | ICD-10-CM | POA: Diagnosis not present

## 2020-09-28 LAB — MICROALBUMIN / CREATININE URINE RATIO
Creatinine, Urine: 34.4 mg/dL
Microalb/Creat Ratio: 30 mg/g creat — ABNORMAL HIGH (ref 0–29)
Microalbumin, Urine: 10.4 ug/mL

## 2020-09-28 MED ORDER — FUROSEMIDE 80 MG PO TABS
ORAL_TABLET | ORAL | 3 refills | Status: DC
  Filled 2021-04-25 – 2021-07-25 (×2): qty 135, 90d supply, fill #0

## 2020-09-28 MED ORDER — POTASSIUM CHLORIDE CRYS ER 20 MEQ PO TBCR: EXTENDED_RELEASE_TABLET | ORAL | 3 refills | Status: DC

## 2020-09-28 MED ORDER — LIDOCAINE 5 % EX PTCH: MEDICATED_PATCH | CUTANEOUS | 2 refills | Status: DC

## 2020-10-01 ENCOUNTER — Telehealth: Payer: Self-pay | Admitting: Pharmacist

## 2020-10-01 DIAGNOSIS — I480 Paroxysmal atrial fibrillation: Secondary | ICD-10-CM | POA: Diagnosis not present

## 2020-10-01 DIAGNOSIS — Z7901 Long term (current) use of anticoagulants: Secondary | ICD-10-CM | POA: Diagnosis not present

## 2020-10-01 NOTE — Telephone Encounter (Signed)
Patient texted results of PST FS POC INR 2.0 (goal 1.5 - 2.5). Will CONTINUE same regimen:  2.11m on Su/Tu/Th/Fr/Sa; 1.223mon We; NO warfarin on Mondays. Repeat INR 29-AUG-22. No bleeding symptoms.

## 2020-10-02 ENCOUNTER — Encounter: Payer: Self-pay | Admitting: *Deleted

## 2020-10-02 ENCOUNTER — Ambulatory Visit
Admission: RE | Admit: 2020-10-02 | Discharge: 2020-10-02 | Disposition: A | Discharge status: Home / Self Care | Payer: HMO | Source: Ambulatory Visit | Attending: Radiology | Admitting: Radiology

## 2020-10-02 ENCOUNTER — Other Ambulatory Visit: Payer: Self-pay

## 2020-10-02 DIAGNOSIS — N2889 Other specified disorders of kidney and ureter: Secondary | ICD-10-CM | POA: Diagnosis not present

## 2020-10-02 HISTORY — PX: IR RADIOLOGIST EVAL & MGMT: IMG5224

## 2020-10-04 ENCOUNTER — Other Ambulatory Visit: Payer: Self-pay | Admitting: *Deleted

## 2020-10-04 DIAGNOSIS — B372 Candidiasis of skin and nail: Secondary | ICD-10-CM

## 2020-10-05 MED ORDER — NYSTATIN 100000 UNIT/GM EX POWD: 1.0000 "application " | Freq: Three times a day (TID) | CUTANEOUS | 4 refills | Status: DC | PRN

## 2020-10-08 ENCOUNTER — Encounter: Payer: Self-pay | Admitting: Internal Medicine

## 2020-10-08 ENCOUNTER — Telehealth: Payer: Self-pay | Admitting: Pharmacist

## 2020-10-08 MED ORDER — HYDROCODONE-ACETAMINOPHEN 5-325 MG PO TABS: 2.0000 | ORAL_TABLET | Freq: Three times a day (TID) | ORAL | 0 refills | Status: DC

## 2020-10-08 NOTE — Assessment & Plan Note (Signed)
No LE edema or JVD.  Monitor.

## 2020-10-08 NOTE — Assessment & Plan Note (Signed)
Lesion dx recurrent RCC.  Cryoablation 09/2020 tolerated well.

## 2020-10-08 NOTE — Assessment & Plan Note (Signed)
Auscultates regular today.  On chronic anticoagulation managed in our [place] clinic.

## 2020-10-08 NOTE — Assessment & Plan Note (Signed)
Chain smoking has resumed.  She is self treating anxiety.  Difficult situation.

## 2020-10-08 NOTE — Assessment & Plan Note (Signed)
A1c 7.4 2 months ago.  Urine for microalbumin today. Recheck A1C 10/2020.

## 2020-10-08 NOTE — Assessment & Plan Note (Addendum)
Asymptomatic. Euvolemic on exam. Lasix dose changed to late afternoon to correspond with how she is taking it with her potassium.

## 2020-10-08 NOTE — Assessment & Plan Note (Addendum)
She didn't like the 7.5 mg hydrocodone and we have resumed the 5/325 which she typically takes 2 tabs bid (3 am, 9a-12p) and sometimes later in the day "I hurt so bad I can't sleep".  Note that she feels the 10 mg dose doesn't work as well as 2 of the 5 mg (likely due to the APAP content). I will prescribe next refill to allow for tid dosing regularly.  PT referral to work on mobility.  She doesn't want to depend on an assistive device.

## 2020-10-08 NOTE — Assessment & Plan Note (Signed)
Discussed recommendation for referral back to interventionalist Dr. Gwenlyn Found now that her more acute issues have stabilized.  Foot peddling equipment is reinforced as potentially beneficial.

## 2020-10-08 NOTE — Assessment & Plan Note (Signed)
Hb low, cells microcytic checked in 07/2020.  Symptomatic.  admitted 08/18/20 under observation status for blood and iron transfusions for recurrent symptomatic anemia ( no obvious bleeding source but AVMs suspected

## 2020-10-08 NOTE — Assessment & Plan Note (Signed)
Toenails trimmed this visit.

## 2020-10-08 NOTE — Telephone Encounter (Signed)
Patient texts results of patient self-testing, point of care, fingerstick INR = 1.4 (goal 1.5 - 2.5). Will INCREASE to one of your 2.31m strength green warfarin tablets by mouth-EXCEPT on Mondays. Repeat PST FS POC INR on Tuesday 6SEP22.

## 2020-10-08 NOTE — Assessment & Plan Note (Signed)
>>  ASSESSMENT AND PLAN FOR CHRONIC CHF (CONGESTIVE HEART FAILURE) (HCC) WRITTEN ON 10/08/2020  4:21 PM BY Miguel Aschoff, MD  Asymptomatic. Euvolemic on exam. Lasix dose changed to late afternoon to correspond with how she is taking it with her potassium.

## 2020-10-08 NOTE — Assessment & Plan Note (Signed)
SBP 133, no changes today

## 2020-10-09 ENCOUNTER — Telehealth: Payer: Self-pay

## 2020-10-09 NOTE — Telephone Encounter (Signed)
Return call to Naoma Diener, case manager with [employer] Stated she received a call from pt this am. The pt told her she's unable to apply lidocaine patches to her back; sometimes her sister may come over and help but she's elderly and lives across town. Also stated she has a wound on her arm. And CM thinks pt is weaker than she say and she lives alone..Told her I will inform pt's doctor and call the pt  Called pt - stated she's having lower back pain which goes down her legs; stated Dr [name] is aware. And she's having difficulty putting the lidocaine patches on her back. Also stated having difficulty putting the cream and dressing the wound on her elbow -stated a band-aid will not stay on and "I make a mess". Stated the only thing she was concern about which she told Sunday Spillers was having difficultly putting on the patches.

## 2020-10-09 NOTE — Telephone Encounter (Signed)
Sunday Spillers with [employer] requesting to speak with a nurse about getting home health services. Please call back.

## 2020-10-10 ENCOUNTER — Ambulatory Visit: Payer: HMO | Admitting: Internal Medicine

## 2020-10-12 ENCOUNTER — Telehealth: Payer: Self-pay

## 2020-10-12 NOTE — Telephone Encounter (Signed)
Requesting to speak with a nurse about something. Please call back.

## 2020-10-12 NOTE — Telephone Encounter (Signed)
RTC Pt bought some epsom salt to soak her feet and she read on the bag that diabetics should not use epsom salt.  She is asking if this would be okay.  RN advised patient that using an epsom salt soak could actually dry her feet out more which could lead to cracking and an increase in foot problems.  She was advised to wash her feet daily, dry them thoroughly and not soak them.  She verbalized understanding.  She states her neuropathy is bothering her more.  She was offered an appt next week in resident clinic and she declined.  She was instructed to call back for an appt if she changed her mind. SChaplin, RN,BSN

## 2020-10-16 ENCOUNTER — Other Ambulatory Visit: Payer: Self-pay | Admitting: Internal Medicine

## 2020-10-16 ENCOUNTER — Telehealth: Payer: Self-pay | Admitting: Pharmacist

## 2020-10-16 DIAGNOSIS — G8929 Other chronic pain: Secondary | ICD-10-CM

## 2020-10-16 DIAGNOSIS — E1140 Type 2 diabetes mellitus with diabetic neuropathy, unspecified: Secondary | ICD-10-CM

## 2020-10-16 DIAGNOSIS — Z794 Long term (current) use of insulin: Secondary | ICD-10-CM

## 2020-10-16 DIAGNOSIS — M5442 Lumbago with sciatica, left side: Secondary | ICD-10-CM

## 2020-10-16 MED ORDER — FREESTYLE LIBRE 2 SENSOR MISC: 1.0000 | Freq: Every day | 3 refills | Status: DC

## 2020-10-16 NOTE — Telephone Encounter (Signed)
Refill Request-  Pt has questions about the dosage and the quantity of her pain prescription.  Pt also has a questions about her Diabetic Sensor as well. The pharmacy states she needs a new prescription as soon as possible because she just took her last one.   HYDROcodone-acetaminophen (NORCO/VICODIN) 5-325 MG tablet  [facility] #[id number] - [place], [place] AT [address] (Ph: [phone])     Continuous Blood Gluc Sensor (FREESTYLE LIBRE 2 SENSOR) [place] ([place], [place] (Ph: [phone])

## 2020-10-16 NOTE — Telephone Encounter (Signed)
Hydrocodone Rx sent 8/29 to start 9/8. Patient notified that 180 tabs was sent at that time and she is very appreciative.  Patient just placed her last sensor today. Requesting refill be sent to [facility]. Will forward refill request to PCP.

## 2020-10-16 NOTE — Telephone Encounter (Signed)
Patient texted me resuls of PST FS POC INR = 1.5 (target 1.5 - 2.5) on 11m warfarin/wk (2.585mall days of week, except on MONDAYS, NO warfarin.) CONTINUE same regimen. Repeat INR in six days, 12-SEP-22.

## 2020-10-17 ENCOUNTER — Telehealth: Payer: Self-pay

## 2020-10-17 ENCOUNTER — Ambulatory Visit: Payer: HMO | Admitting: Behavioral Health

## 2020-10-17 DIAGNOSIS — I270 Primary pulmonary hypertension: Secondary | ICD-10-CM | POA: Diagnosis not present

## 2020-10-17 DIAGNOSIS — J449 Chronic obstructive pulmonary disease, unspecified: Secondary | ICD-10-CM | POA: Diagnosis not present

## 2020-10-17 DIAGNOSIS — J439 Emphysema, unspecified: Secondary | ICD-10-CM | POA: Diagnosis not present

## 2020-10-17 NOTE — Telephone Encounter (Signed)
Requesting to speak with Dr. [name] about something. Please call pt back.

## 2020-10-18 NOTE — Telephone Encounter (Signed)
Pt would like to speak with a nurse, please call pt back.

## 2020-10-18 NOTE — Telephone Encounter (Signed)
Called pt back - stated she really wants to talk to Dr [name]; stated "something happened to her yesterday". Stated she had a hallucination that scared her (nothing bad). Stated she only needs a 5 minute call.

## 2020-10-22 ENCOUNTER — Telehealth: Payer: Self-pay | Admitting: Pharmacist

## 2020-10-22 NOTE — Telephone Encounter (Signed)
Patient texted me results of her fingerstick point of care patient self testing INR = 1.8 (goal 1.5 - 2.5). Will remain on same regimen: 2.43m warfarin all days of week--except on MONDAYS, NO warfarin on Mondays. Repeat PST FS POC INR on 19-SEP-22.

## 2020-10-23 ENCOUNTER — Telehealth: Payer: Self-pay | Admitting: Dietician

## 2020-10-23 NOTE — Telephone Encounter (Signed)
Provided CGM support. Mailing information.

## 2020-10-29 DIAGNOSIS — I050 Rheumatic mitral stenosis: Secondary | ICD-10-CM | POA: Diagnosis not present

## 2020-10-29 DIAGNOSIS — I480 Paroxysmal atrial fibrillation: Secondary | ICD-10-CM | POA: Diagnosis not present

## 2020-10-29 DIAGNOSIS — J449 Chronic obstructive pulmonary disease, unspecified: Secondary | ICD-10-CM | POA: Diagnosis not present

## 2020-10-29 DIAGNOSIS — Z7901 Long term (current) use of anticoagulants: Secondary | ICD-10-CM | POA: Diagnosis not present

## 2020-10-29 DIAGNOSIS — I509 Heart failure, unspecified: Secondary | ICD-10-CM | POA: Diagnosis not present

## 2020-10-30 ENCOUNTER — Ambulatory Visit: Payer: HMO | Admitting: Cardiology

## 2020-10-30 ENCOUNTER — Other Ambulatory Visit: Payer: Self-pay | Admitting: Internal Medicine

## 2020-10-30 DIAGNOSIS — F419 Anxiety disorder, unspecified: Secondary | ICD-10-CM

## 2020-10-31 NOTE — Addendum Note (Signed)
Encounter addended by: Aletta Edouard, MD on: 10/31/2020 11:56 AM  Actions taken: SmartForm saved, Clinical Note Signed

## 2020-10-31 NOTE — Progress Notes (Signed)
Chief Complaint: Patient was consulted remotely today (TeleHealth) for follow up after left renal cryoablation.  History of Present Illness: [name] is a 70 y.o. female status post prior cryoablation of a left posterior lower pole clear cell renal carcinoma on 09/19/2011 which demonstrated no evidence of recurrence for 5 years after treatment.  She was recently referred for an enlarging upper pole lesion of the left kidney measuring approximately 1.4 cm and demonstrating partial central enhancement suspicious for a slow-growing renal carcinoma.  This was treated successfully with percutaneous cryoablation on 09/12/2020.  Biopsy of the lesion could not be performed at the same time due to small size of the lesion and positioning adjacent to the spleen.  The procedure was well-tolerated and [name] is currently back to baseline.  She does have chronic low back pain and lower extremity pain.  Past Medical History:  Diagnosis Date   Abnormality of lung on CXR 02/02/2020   Nonspecific finding on CXR ordered by pulmonologist - c/w inflammation vs infection, f/u imaging suggested, Dr. [name] office has been in communication about recommended next steps.   Anxiety 07/24/2010   Aortic atherosclerosis ([place]) 10/19/2014   Seen on CT scan, currently asymptomatic   Arteriovenous malformation of gastrointestinal tract 08/08/2015   Non-bleeding when visualized on capsule endoscopy 06/30/2015    Arthritis    "lower back; hands" (02/19/2018)   Asthma    Asymptomatic cholelithiasis 09/25/2015   Seen on CT scan 08/2015   Carotid artery stenosis; s/p R endarterectomy    s/p right endarterectomy (06/2010) Carotid US (07/2010):  Left: Moderate-to-severe (60-79%) calcific and non-calcific plaque origin and proximal ICA and ECA    Chronic congestive heart failure with left ventricular diastolic dysfunction (HCC) 10/21/2010   Chronic constipation 02/03/2011   Chronic daily headache 01/16/2014    Chronic iron deficiency anemia    Chronic low back pain 10/06/2012   Chronic venous insufficiency 08/04/2012   COPD (chronic obstructive pulmonary disease) with emphysema (HCC)    PFTs 2018: severe obstructive disease, insignif response to bronchiodilator, mild restriction parenchymal pattern, moderately severe diffusion defect. 2014  FEV1 0.92 (40%), ratio 69, 27% increase in FEV1 with BD, TLC 91%, severe airtrapping, DLCO49% On chronic home O2. Pulmonary rehab referral 05/2012    Dyspnea    Fibromyalgia 08/29/2010   Gastroesophageal reflux disease    History of blood transfusion    "several times"  (02/19/2018)   History of clear cell renal cell carcinoma ([place]), in remission 07/21/2011   s/p cryoablation of left RCC in 09/2011 by [name]. Followed by [name]  [facility]) .     History of hiatal hernia    History of mitral valve replacement with bioprosthetic valve due to mitral stenosis 2012   s/p MVR with a 27-mm pericardial porcine valve (Medtronic Mosaic valve, serial #[id number] on 09/20/10, Dr. Prescott Gum)    History of obstructive sleep apnea, resolved 2013   resolved per sleep study 07/2019; no apnea, but did have desaturation.  CPAP no longer necessary.  Nocturnal polysomnography (06/2009): Moderate sleep apnea/ hypopnea syndrome , AHI 17.8 per hour with nonpositional hypopneas. CPAP titration to 12 CWP, AHI 2.4 per hour. On nocturnal CPAP via a small resMed Quattro full-face mask with heated humidifier.    History of pneumonia    "once"  (02/19/2018)   History of seborrheic keratosis 09/28/2015   Hyperlipidemia LDL goal < 100 11/20/2005   Internal and external hemorrhoids without complication 62/13/0865   Lichen sclerosus of  female genitalia 01/12/2017   Migraine    "none in years" (02/19/2018)   Moderately severe major depression ([place]) 11/19/2005   Nocturnal hypoxia per sleep study 07/2019    Osteoporosis    DEXA 2016: T -2.7; DEXA (12/09/2011): L-spine T -3.7, left  hip T -1.4 DEXA (12/2004): L-spine T -2.6, left hip -0.1    Paroxysmal atrial fibrillation ([place]) 10/22/2010   s/p Left atrial maze procedure for paroxysmal atrial fibrillation on 09/20/2010 by Dr Prescott Gum.  Subsequent splenic infarct, decision was made to re-anticoagulate with coumadin, likely life-long as this is the most likely cause of the splenic infarct.    Personal history of colonic polyps 05/14/2011   Colonoscopy (05/2011): 4 mm adenomatous polyp excised endoscopically Colonoscopy (02/2002): Adenomatous polyp excised endoscopically    Personal history of renal cell carcinoma 09/12/2020   Pneumonia    Pulmonary hypertension due to chronic obstructive pulmonary disease ([place]) 04/25/2016   2014 TEE w PA peak pressure 46 mmHg, s/p MV replacement    Right nephrolithiasis, asymptomatic, incidental finding 09/06/2014   5 mm non-obstructing calculus seen on CT scan 09/05/2014    Right ventricular failure ([place]) 04/25/2016   Severe obesity (BMI 35.0-39.9) with comorbidity ([place]) 10/23/2011   Sleep apnea    Tobacco abuse 07/28/2012   Type 2 diabetes mellitus with diabetic neuropathy [facility])     Past Surgical History:  Procedure Laterality Date   CARDIAC CATHETERIZATION     CARDIAC VALVE REPLACEMENT  Aug. 2012   "mitral valve"   CAROTID ENDARTERECTOMY Right 07/04/2010   by [name] for asymptomatic right carotid artery stenosis   CATARACT EXTRACTION W/ INTRAOCULAR LENS  IMPLANT, BILATERAL Bilateral    CHEST TUBE INSERTION  09/24/2010   Dr Lucianne Lei Trigt   COLONOSCOPY  05/12/2011   performed by Dr. Michail Sermon. Showing small internal hemorrhoids, single tubular adenoma polyp   COLONOSCOPY WITH PROPOFOL N/A 05/29/2020   Procedure: COLONOSCOPY WITH PROPOFOL;  Surgeon: Clarene Essex, MD;  Location: [facility]COPY;  Service: Endoscopy;  Laterality: N/A;   CRYOABLATION Left 09/2011   by [name]. Followed by [name]  [facility]) .     DILATION AND CURETTAGE OF UTERUS     ESOPHAGOGASTRODUODENOSCOPY   05/12/2011   performed by Dr. Michail Sermon. Negative for ulcerations, biopsy negative for evidence of celiac sprue   ESOPHAGOGASTRODUODENOSCOPY N/A 06/29/2015   Procedure: ESOPHAGOGASTRODUODENOSCOPY (EGD);  Surgeon: Clarene Essex, MD;  Location: [facility] ENDOSCOPY;  Service: Endoscopy;  Laterality: N/A;   ESOPHAGOGASTRODUODENOSCOPY N/A 03/29/2016   Procedure: ESOPHAGOGASTRODUODENOSCOPY (EGD);  Surgeon: Clarene Essex, MD;  Location: [facility] ENDOSCOPY;  Service: Endoscopy;  Laterality: N/A;   ESOPHAGOGASTRODUODENOSCOPY (EGD) WITH PROPOFOL N/A 05/29/2020   Procedure: ESOPHAGOGASTRODUODENOSCOPY (EGD) WITH PROPOFOL;  Surgeon: Clarene Essex, MD;  Location: [facility];  Service: Endoscopy;  Laterality: N/A;   FRACTURE SURGERY     GIVENS CAPSULE STUDY N/A 06/30/2015   Procedure: GIVENS CAPSULE STUDY;  Surgeon: Clarene Essex, MD;  Location: [place];  Service: Endoscopy;  Laterality: N/A;   GIVENS CAPSULE STUDY N/A 06/29/2015   Procedure: GIVENS CAPSULE STUDY;  Surgeon: Clarene Essex, MD;  Location: [place];  Service: Endoscopy;  Laterality: N/A;   HEMORRHOID SURGERY  1970s?   "lanced"   HYSTEROSCOPY W/ ENDOMETRIAL ABLATION  06/2001   for persistent post-menopausal bleeding // by S. [name], M.D.   IR GENERIC HISTORICAL  08/23/2015   IR RADIOLOGIST EVAL & MGMT 08/23/2015 Aletta Edouard, MD [facility] INTERV RAD   IR GENERIC HISTORICAL  04/09/2016   IR RADIOLOGIST  EVAL & MGMT 04/09/2016 Aletta Edouard, MD [facility] INTERV RAD   IR RADIOLOGIST EVAL & MGMT  10/07/2016   IR RADIOLOGIST EVAL & MGMT  06/25/2017   IR RADIOLOGIST EVAL & MGMT  07/31/2020   IR RADIOLOGIST EVAL & MGMT  10/02/2020   LEFT HEART CATH AND CORONARY ANGIOGRAPHY N/A 04/21/2016   Procedure: Left Heart Cath and Coronary Angiography;  Surgeon: Lorretta Harp, MD;  Location: [place] CV LAB;  Service: Cardiovascular;  Laterality: N/A;   LIPOMA EXCISION  08/2005   occipital lipoma 1.5cm - by Dr. [name]   LITHOTRIPSY  ~ 2000   MAZE Left 09/20/10   for paroxysmal  atrial fibrillation (Dr. Prescott Gum)   MITRAL VALVE REPLACEMENT  09/20/10    with a 27-mm pericardial porcine valve (Medtronic Mosaic valve, serial #[id number]). 09/20/10, Dr Prescott Gum   ORIF CLAVICLE FRACTURE Right 01/2004   by Thana Farr. [name], M.D for Right clavicle nonunion.; "it's got a pin in it"   POLYPECTOMY  05/29/2020   Procedure: POLYPECTOMY;  Surgeon: Clarene Essex, MD;  [facility];  Service: Endoscopy;;   RADIOLOGY WITH ANESTHESIA Left 09/12/2020   Procedure: RADIOLOGY WITH ANESTHESIA- RENAL CRYOABLATION;  Surgeon: Aletta Edouard, MD;  Location: [facility];  Service: Radiology;  Laterality: Left;   REFRACTIVE SURGERY Bilateral    RIGHT HEART CATH N/A 04/23/2016   Procedure: Right Heart Cath;  Surgeon: [name], MD;  Location: [place] CV LAB;  Service: Cardiovascular;  Laterality: N/A;   TONSILLECTOMY     TUBAL LIGATION      Allergies: Dexlansoprazole, Lorazepam, Oxycontin [oxycodone], and Tramadol hcl  Medications: Prior to Admission medications   Medication Sig Start Date End Date Taking? Authorizing Provider  albuterol (PROAIR HFA) 108 (90 Base) MCG/ACT inhaler Inhale 2 puffs into the lungs every 6 (six) hours as needed for shortness of breath. 09/17/20   Sid Falcon, MD  ALPRAZolam (XANAX) 1 MG tablet TAKE 1 TABLET BY MOUTH EVERY NIGHT AT BEDTIME AS NEEDED FOR INSOMNIA. MAY TAKE 1/2 TABLET BY MOUTH DURING THE DAY FOR ANXIETY 09/17/20   Sid Falcon, MD  benzonatate (TESSALON) 100 MG capsule Take 1 capsule (100 mg total) by mouth 3 (three) times daily as needed for cough. 08/02/20   Angelica Pou, MD  betamethasone dipropionate 0.05 % cream Apply topically 2 (two) times daily. 05/31/20   Angelica Pou, MD  Blood Glucose Monitoring Suppl (ONETOUCH VERIO) W/DEVICE KIT 1 each by Does not apply route 4 (four) times daily. 07/25/14   [name], MD  buPROPion (WELLBUTRIN XL) 150 MG 24 hr tablet Take 1 tablet (150 mg total) by mouth every morning for 365  doses. 04/12/20 04/12/21  Angelica Pou, MD  Continuous Blood Gluc Sensor (FREESTYLE LIBRE 2 SENSOR) MISC 1 each by Does not apply route 6 (six) times daily. To read 6 times daily with change of sensor every 2 weeks, 90 days worth with 3 refills 10/16/20   Angelica Pou, MD  DULoxetine (CYMBALTA) 60 MG capsule Take 1 capsule (60 mg total) by mouth 2 (two) times daily. 09/17/20 10/17/20  Sid Falcon, MD  fluticasone (FLONASE) 50 MCG/ACT nasal spray Place 2 sprays into both nostrils daily. 02/13/20   Velna Ochs, MD  Fluticasone-Umeclidin-Vilant (TRELEGY ELLIPTA) 100-62.5-25 MCG/INH AEPB Inhale 1 puff into the lungs daily. 02/07/20   Deneise Lever, MD  furosemide (LASIX) 80 MG tablet Take 1 tablet (80 mg total) by mouth every morning AND 0.5 tablets (40  mg total) daily in the afternoon. 09/28/20   Angelica Pou, MD  gabapentin (NEURONTIN) 300 MG capsule Take 2 capsules (600 mg total) by mouth 3 (three) times daily. 06/01/20   Angelica Pou, MD  glucose blood [facility] VERIO) test strip Use to check blood sugar up to 7 times weekly. diag code E11.40. Insulin dependent 03/01/20   Angelica Pou, MD  HYDROcodone-acetaminophen (NORCO/VICODIN) 5-325 MG tablet Take 2 tablets by mouth 3 (three) times daily. 10/18/20   Angelica Pou, MD  Insulin Degludec-Liraglutide (XULTOPHY) 100-3.6 UNIT-MG/ML SOPN Inject 40 Units into the skin daily for 30 doses. 07/25/20 09/12/20  Angelica Pou, MD  Insulin Pen Needle 32G X 4 MM MISC Use to inject insulin up to 6 times a day 08/04/17   [name], MD  Insulin Syringe-Needle U-100 31G X 15/64" 0.5 ML MISC Use to inject insulin up to 4 times a day 04/17/16   [name], MD  ipratropium-albuterol (DUONEB) 0.5-2.5 (3) MG/3ML SOLN Take 3 mLs by nebulization every 6 (six) hours as needed for up to 28 days. Patient taking differently: Take 3 mLs by nebulization every 6 (six) hours as needed (shortness of breath, wheezing). 04/17/20  08/28/20  Angelica Pou, MD  Lancets Misc. (ACCU-CHEK FASTCLIX LANCET) KIT Check your blood 4 times a day dx code 250.00 insulin requiring 01/19/13   [name], MD  lidocaine (LIDODERM) 5 % Place 1 or 2 patches to painful area of back each day.  Remove & Discard patch within 12 hours or as directed by MD 09/28/20   Angelica Pou, MD  metoprolol succinate (TOPROL-XL) 50 MG 24 hr tablet Take 1 tablet (50 mg total) by mouth daily. Take with or immediately following a meal. 09/17/20   Sid Falcon, MD  nystatin powder Apply 1 application topically 3 (three) times daily as needed. 10/05/20   Angelica Pou, MD  omeprazole (PRILOSEC) 40 MG capsule Take 1 capsule (40 mg total) by mouth in the morning and at bedtime. 09/17/20 10/17/20  Sid Falcon, MD  OXYGEN Inhale 2 L into the lungs at bedtime.    [provider]  potassium chloride SA (KLOR-CON) 20 MEQ tablet Take 2 tablets (40 mEq total) by mouth every morning AND 1 tablet (20 mEq total) daily in the afternoon. 09/28/20   Angelica Pou, MD  PRESCRIPTION MEDICATION Inhale into the lungs at bedtime. CPAP    [provider]  rosuvastatin (CRESTOR) 20 MG tablet TAKE 1 TABLET BY MOUTH ONCE EVERY NIGHT AT BEDTIME 06/01/20   Angelica Pou, MD  warfarin (COUMADIN) 2.5 MG tablet Take 2.5 mg by mouth as directed.    [provider]     Family History  Problem Relation Age of Onset   Peptic Ulcer Disease Father    Heart attack Father 22       Died of MI at age 69   Heart attack Brother 71       Died of MI at age 47   Obesity Brother    Pneumonia Mother    Healthy Sister    Lupus Daughter    Obsessive Compulsive Disorder Daughter     Social History   Socioeconomic History   Marital status: Divorced    Spouse name: Not on file   Number of children: Not on file   Years of education: Not on file   Highest education level: Not on file  Occupational History   Not on file  Tobacco Use    Smoking status: Every Day    Packs/day: 1.00    Years: 55.00    Pack years: 55.00    Types: Cigarettes    Start date: 11/01/1962   Smokeless tobacco: Never  Vaping Use   Vaping Use: Never used  Substance and Sexual Activity   Alcohol use: Not Currently    Comment: 02/19/2018 "nothing since 1999"   Drug use: Not Currently    Types: Cocaine, Marijuana    Comment: 02/19/2018 "nothing since the 1990s"   Sexual activity: Not Currently    Birth control/protection: Post-menopausal  Other Topics Concern   Not on file  Social History Narrative   Lives alone in [place] ([place])   Worked at [employer] for 18 years   No car   Social Determinants of Radio broadcast assistant Strain: Not on [employer] Insecurity: No Food Insecurity   Worried About Charity fundraiser in the Last Year: Never true   Arboriculturist in the Last Year: Never true  Transportation Needs: No Transportation Needs   Lack of Transportation (Medical): No   Lack of Transportation (Non-Medical): No  Physical Activity: Not on file  Stress: Not on file  Social Connections: Not on file    Review of Systems  Constitutional: Negative.   Respiratory:         Chronic dyspnea.  Cardiovascular: Negative.   Gastrointestinal: Negative.   Genitourinary: Negative.   Musculoskeletal:  Positive for back pain and myalgias.  Neurological:  Positive for weakness.   Review of Systems: A 12 point ROS discussed and pertinent positives are indicated in the HPI above.  All other systems are negative.  Physical Exam No direct physical exam was performed (except for noted visual exam findings with Video Visits).   Vital Signs: There were no vitals taken for this visit.  Imaging: IR Radiologist Eval & Mgmt  Result Date: 10/02/2020 Please refer to notes tab for details about interventional procedure. (Op Note)   Labs:  CBC: Recent Labs    08/19/20 0050 09/04/20 0937 09/12/20 0716 09/13/20 0554  WBC  7.4 7.5 7.8 8.8  HGB 9.3* 11.9* 11.4* 11.0*  HCT 31.6* 40.0 38.4 37.5  PLT 179 260 206 196    COAGS: Recent Labs    08/18/20 1603 08/19/20 0050 09/12/20 0716  INR 1.6* 1.6* 1.1  APTT  --   --  27    BMP: Recent Labs    08/19/20 0050 09/04/20 0937 09/12/20 0716 09/13/20 0554  NA 135 138 141 141  K 4.1 4.8 4.1 4.2  CL 101 101 100 106  CO2 27 31 32 28  GLUCOSE 136* 176* 103* 174*  BUN _0 CALCIUM 9.0 9.5 9.7 9.0  CREATININE 1.10* 0.87 1.02* 1.01*  GFRNONAA 54* >60 59* 60*    LIVER FUNCTION TESTS: Recent Labs    04/18/20 1555  BILITOT 0.3  AST 10  ALT 6  ALKPHOS 45  PROT 6.7  ALBUMIN 3.8     Assessment and Plan:  I spoke with Ms. Summa over the phone.  Renal function was unaffected by cryoablation in the morning after the procedure and was with estimated GFR of 60 mL/min.  I recommended a follow-up CT of the abdomen with and without contrast in early November, 3 months after ablation.  I will review imaging with her at that time.   Electronically Signed: Azzie Roup 10/31/2020, 11:38 AM  I spent a total of  10 Minutes in remote  clinical consultation, greater than 50% of which was counseling/coordinating care for left renal mass.    Visit type: Audio only (telephone). Audio (no video) only due to patient's lack of internet/smartphone capability. Alternative for in-person consultation at [facility], [address] [place], [place], [place]. This visit type was conducted due to national recommendations for restrictions regarding the COVID-19 Pandemic (e.g. social distancing).  This format is felt to be most appropriate for this patient at this time.  All issues noted in this document were discussed and addressed.

## 2020-11-01 ENCOUNTER — Ambulatory Visit: Payer: HMO | Admitting: Behavioral Health

## 2020-11-01 ENCOUNTER — Telehealth: Payer: Self-pay | Admitting: Behavioral Health

## 2020-11-01 NOTE — Telephone Encounter (Signed)
Reached Pt for remaining 10 min of telehealth visit. Pt is having issues w/pain. Pt is utilizing the assistance of her Str to place lidocaine patches on her back. Pt agreed to call Office & secure addt'l appt.  Dr. Theodis Shove

## 2020-11-05 ENCOUNTER — Telehealth: Payer: Self-pay | Admitting: Pharmacist

## 2020-11-05 NOTE — Telephone Encounter (Deleted)
Patient texted results of PST FS POC = 2.4 on 15mg warfarin total per week. Decreased to 13.75mg warfarin total per week as 0mg M/2.5mg Tu/We/Fri/Sat/Sun; 1/2 x 2.5mg (1.25mg) on Thursday. Repeat INR Monday 3-OCT-22.  

## 2020-11-05 NOTE — Telephone Encounter (Signed)
Patient texted results of PST FS POC = 2.4 on 713m warfarin total per week. Decreased to 13.747mwarfarin total per week as 13m24m/2.5mg71m/We/Fri/Sat/Sun; 1/2 x 2.5mg 51m25mg)6mThursday. Repeat INR Monday 3-OCT-22.

## 2020-11-06 ENCOUNTER — Telehealth: Payer: Self-pay

## 2020-11-06 NOTE — Telephone Encounter (Signed)
Received TC from patient who states she fell at home around 0400 this morning.  States she went to get up and her leg gave out on her.  States after she fell, she couldn't get up and ended up call 911.  States EMS came out, assisted her and told her she did not need to be seen in the ED.  Patient  c/o right foot pain, states she is ambulating with her cane and finds it difficult to bear weight.  She denies any swelling or bruising.  She states she did not hit her head.  RN informed patient she may need imaging to r/o fx, she states she understands, but does not want to do this now.  She was offered an appt w/ resident and denied, states she only wants to see PCP.  Appt was made for Thursday, 11/08/20 @ 0915 w/ PCP.  Pt was instructed if pain worsened, or swelling/bruising started to proceed to ED/urgent care for evaluation/imaging and she verbalized understanding.  Pt also states her cat attacked her when she was on the floor and caused several scratches.  Pt on coumadin, denies any bleeding.  She was instructed to keep scratches clean and dry.  She states her cat has been vaccinated against rabies. SChaplin, RN,BSN

## 2020-11-06 NOTE — Telephone Encounter (Signed)
Patient called back and states she took a nap this afternoon and since she woke up, her pain has worsened and is now intense and in her right foot and leg.  She denies any SHOB. Pt informed if her pain is this severe, she needs to be evaluated at the ED to r/o fx's, blood clots, etc.  She verbalized understanding.  She was provided the address for [facility] ED at [facility] as well.  She states she will call her sister for transportation.  She was informed she could also call 911 if need be and she verbalized understanding. SChaplin, RN,BSN

## 2020-11-07 ENCOUNTER — Telehealth: Payer: Self-pay | Admitting: Dietician

## 2020-11-07 NOTE — Telephone Encounter (Signed)
[name] calls because she could not get through to triage, the after hours person would not page anyone because it was not 5 PM yet.   She states she fell the other day did not go to ED, took nap today, when she woke, blood vessels on her legs were sticking out, they have gone down mostly now. She wants to know if this is a sign of a blood clot. Her pain has not changed since her fall. No other unusual symptoms  She would like someone to call her and advise if it is a sign /symptoms of a blood clot, she will go to ED, if it is not a symptom of blood clot she can wait to come to our office tomorrow. She was advised to call the after hours number again if she does not get a call in the next 30 minutes.

## 2020-11-08 ENCOUNTER — Encounter: Payer: Self-pay | Admitting: Internal Medicine

## 2020-11-08 ENCOUNTER — Ambulatory Visit (INDEPENDENT_AMBULATORY_CARE_PROVIDER_SITE_OTHER): Payer: HMO | Admitting: Internal Medicine

## 2020-11-08 VITALS — BP 145/54 | HR 57 | Temp 97.7°F | Ht 62.0 in | Wt 164.4 lb

## 2020-11-08 DIAGNOSIS — E559 Vitamin D deficiency, unspecified: Secondary | ICD-10-CM | POA: Diagnosis not present

## 2020-11-08 DIAGNOSIS — S8391XA Sprain of unspecified site of right knee, initial encounter: Secondary | ICD-10-CM

## 2020-11-08 DIAGNOSIS — K449 Diaphragmatic hernia without obstruction or gangrene: Secondary | ICD-10-CM

## 2020-11-08 DIAGNOSIS — E7841 Elevated Lipoprotein(a): Secondary | ICD-10-CM

## 2020-11-08 DIAGNOSIS — M797 Fibromyalgia: Secondary | ICD-10-CM | POA: Diagnosis not present

## 2020-11-08 DIAGNOSIS — Z23 Encounter for immunization: Secondary | ICD-10-CM

## 2020-11-08 DIAGNOSIS — I739 Peripheral vascular disease, unspecified: Secondary | ICD-10-CM

## 2020-11-08 DIAGNOSIS — G8929 Other chronic pain: Secondary | ICD-10-CM

## 2020-11-08 DIAGNOSIS — Z7901 Long term (current) use of anticoagulants: Secondary | ICD-10-CM

## 2020-11-08 DIAGNOSIS — Z79899 Other long term (current) drug therapy: Secondary | ICD-10-CM

## 2020-11-08 DIAGNOSIS — K219 Gastro-esophageal reflux disease without esophagitis: Secondary | ICD-10-CM

## 2020-11-08 DIAGNOSIS — J449 Chronic obstructive pulmonary disease, unspecified: Secondary | ICD-10-CM

## 2020-11-08 DIAGNOSIS — M5441 Lumbago with sciatica, right side: Secondary | ICD-10-CM

## 2020-11-08 DIAGNOSIS — I10 Essential (primary) hypertension: Secondary | ICD-10-CM

## 2020-11-08 DIAGNOSIS — M810 Age-related osteoporosis without current pathological fracture: Secondary | ICD-10-CM | POA: Diagnosis not present

## 2020-11-08 DIAGNOSIS — I480 Paroxysmal atrial fibrillation: Secondary | ICD-10-CM

## 2020-11-08 DIAGNOSIS — Z720 Tobacco use: Secondary | ICD-10-CM | POA: Diagnosis not present

## 2020-11-08 DIAGNOSIS — F321 Major depressive disorder, single episode, moderate: Secondary | ICD-10-CM | POA: Diagnosis not present

## 2020-11-08 DIAGNOSIS — E1140 Type 2 diabetes mellitus with diabetic neuropathy, unspecified: Secondary | ICD-10-CM | POA: Diagnosis not present

## 2020-11-08 DIAGNOSIS — I5032 Chronic diastolic (congestive) heart failure: Secondary | ICD-10-CM

## 2020-11-08 DIAGNOSIS — D509 Iron deficiency anemia, unspecified: Secondary | ICD-10-CM

## 2020-11-08 LAB — POCT GLYCOSYLATED HEMOGLOBIN (HGB A1C): Hemoglobin A1C: 7.1 % — AB (ref 4.0–5.6)

## 2020-11-08 LAB — GLUCOSE, CAPILLARY: Glucose-Capillary: 99 mg/dL (ref 70–99)

## 2020-11-08 MED ORDER — DULOXETINE HCL 60 MG PO CPEP: 60.0000 mg | ORAL_CAPSULE | Freq: Two times a day (BID) | ORAL | 3 refills | Status: DC

## 2020-11-08 MED ORDER — IPRATROPIUM-ALBUTEROL 0.5-2.5 (3) MG/3ML IN SOLN: 3.0000 mL | Freq: Four times a day (QID) | RESPIRATORY_TRACT | 3 refills | Status: DC | PRN

## 2020-11-08 MED ORDER — OMEPRAZOLE 40 MG PO CPDR: 40.0000 mg | DELAYED_RELEASE_CAPSULE | Freq: Two times a day (BID) | ORAL | 3 refills | Status: DC

## 2020-11-08 MED ORDER — VITAMIN D3 1.25 MG (50000 UT) PO TABS: 50000.0000 [IU] | ORAL_TABLET | ORAL | 0 refills | Status: AC

## 2020-11-08 NOTE — Progress Notes (Addendum)
[name] is here for routine close f/u of multiple chronic conditions, but more urgently for evaluation of R leg pain sustained from a fall earlier this week.  Since last visit with me 09/27/2020, she has not had any scheduled medical appointments. REady for flu shot today and will have A1C repeated this visit.  She is currently using a CGM.   I had also referred her back to Dr. Gwenlyn Found to assess status of her PAD (appt tomorrow).  I had referred her to PT and [facility] to determine if she qualifies for HHA.  Labs checked at last visit revealed severe vit D deficiency (6) which we will address today.  Very sad and conflicted about her poor health.  Feels much of her decline has been subsequent to past medical interventions. Fearful about stopping smoking, doesn't think she can do it.  Enjoys talking to 22 1/71 yo Conservation officer, historic buildings  (by phone nearly daily) but laments inability to get down on floor and play with or hold her.    Pain everywhere, sensitive to touch arms, legs, feet, trunk.   "There isn't a place I don't hurt".  2-3 falls/week, loses balance toward back often, doesn't feel steady, no dizziness.   R knee has born weight of several falls, has been very painful, though she has been able to walk.    Fall earlier this week - got up to use BR, lost balance in a small area by bed, trapped with R foot/toes tucked under, she was able to contact EMS who assisted her to chair and after assessment advised that ED visit not necessary.  Did not strike head, no LOC.   Conflict with sister recently.  She doesn't feel supported, yet must depend on her.   Patient Active Problem List   Diagnosis Date Noted   Traumatic hemarthrosis of right knee 11/08/2020   Personal history of renal cell carcinoma 09/12/2020   Tinea pedis of both feet 08/07/2020   Lesion of left native kidney 06/01/2020   Nocturnal hypoxia per sleep study 07/2019    Chronic iron deficiency anemia    Carotid artery stenosis; s/p R endarterectomy     COPD mixed type ([place])    Arthritis of both hands 07/01/2019   Pain due to onychomycosis of toenails of both feet 05/27/2019   Benign hypertensive heart and CKD, stage 3 (GFR 30-59), w CHF ([place]) 11/26/5100   Lichen sclerosus of female genitalia 01/12/2017   Mixed stress and urge urinary incontinence 05/27/2016   Pulmonary hypertension due to chronic obstructive pulmonary disease ([place]) 04/25/2016   Asymptomatic cholelithiasis 09/25/2015   Aortic atherosclerosis ([place]) 10/19/2014   Right nephrolithiasis, asymptomatic (incidental finding) 09/06/2014   Chronic low back pain 10/06/2012   Long term current use of anticoagulant therapy 09/30/2012   Chronic venous insufficiency 08/04/2012   Recurrent candidal intertrigo 07/28/2012   Tobacco abuse 07/28/2012   Obesity (BMI 30-39.9) 10/23/2011   Personal history of colonic polyps 05/14/2011   Sleep disturbance 05/14/2011   Abdominal wall hernia 05/14/2011   Health care maintenance 11/04/2010   PAF (paroxysmal atrial fibrillation) ([place]) 10/22/2010   Chronic congestive heart failure with left ventricular diastolic dysfunction ([place]) 10/21/2010   Fibromyalgia 08/29/2010   Gastroesophageal reflux disease    Type 2 diabetes mellitus with diabetic neuropathy (HCC)    Osteoporosis    PAD (peripheral artery disease) ([place]) 05/27/2010   History of mitral valve replacement with bioprosthetic valve 2012   Essential hypertension 06/19/2008   Hyperlipidemia 11/20/2005   Moderate major  depression ([place]) 11/19/2005   Social history reviewed.  BP (!) 145/54 (BP Location: Right Arm, Patient Position: Sitting, Cuff Size: Small)   Pulse (!) 57   Temp 97.7 F (36.5 C) (Oral)   Ht _0  (1.575 m)   Wt 164 lb 6.4 oz (74.6 kg)   SpO2 100%   BMI 30.07 kg/m   Anxious and worried affect.  Lungs with sparse basilar course rhonchi, heart RRR no murmur.  LUE and back with superficial scratches where cat scratched her. Tremulous w/o resting tremor (mostly action).  R knee effusion primarily L lower medial compartment.  Significantly delayed cap refill of blanched areas of feet and lower legs (feet and toes with baseline dependent rubor) when legs extended, improved when dependent.  Blanching persists in some areas.  No edema.  Feet with unchanged tinea pedis scaling.    Assessment and Plan (see also problem based documentation)  New dxs:  Severe vitamin D deficiency- initiate High dose vit D weekly x 6 wks, recheck next visit then daily supplement. Traumatic R knee hemarthrosis due to anticoagulation.  Subtotal aspiration today.      Procedure note:  With leg at 90 degrees and pt in sitting position, sterile procedure followed to infiltrate skin with lidocaine 1%,and 18 g needle placed into inferomedial fluid collection.  2 cc bloody aspirate not sent for studies.  Prior, Ms. Mottley provided consent verbally for the procedure which was tolerated without discomfort, no complications.

## 2020-11-08 NOTE — Assessment & Plan Note (Signed)
The idea of giving up smoking is overwhelming to her - it is her only pleasure and she fears that she cannot be successful.  On high dose bupropion.

## 2020-11-08 NOTE — Assessment & Plan Note (Signed)
Minor complication of anticoagulation  - traumatic R knee hemarthrosis.  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  SHe is having frequent falls and ongoing anticoagulation may be placing her at a higher risk for severe injury than the risk of a hypercoagulation complication. No changes made today.

## 2020-11-08 NOTE — Patient Instructions (Signed)
Ms. Harbold,  Today we drained your right knee of some blood which had gathered from the injuries you've had over the past several weeks.  We discussed the worry about significant injury due to more frequent falls, particularly because you are taking a blood thinner.  I'm hoping that  Dr. Gwenlyn Found can discuss with you the pros/cons of continuing the coumadin from his cardiology perspective.  You've been in normal rhythm as long as I've known you, though atrial fibrillation can be sporadic.  It's tough for you living with daily severe pain.  I wish there was an easy answer.  We'll keep working together.  Thank you for getting your flu shot today!  Take care of yourself,  Dr. [name]

## 2020-11-09 ENCOUNTER — Ambulatory Visit: Payer: HMO | Admitting: Cardiovascular Disease

## 2020-11-09 NOTE — Assessment & Plan Note (Signed)
Currently compensated. Monitor.

## 2020-11-09 NOTE — Assessment & Plan Note (Signed)
Pain is diffuse, generalized, from neck to torso to limbs proximal and distal.  Polypharmacy limits addition of new medicine such as a muscle relaxant - she is already experiencing frequent falls.  Will be looking into buprenorphine to replace her ineffective hydrocodone, which may be a safer more reliable treatment for her chronic pain (also chronic LBP).

## 2020-11-09 NOTE — Assessment & Plan Note (Signed)
Hg 11 with nml MCV 09/13/20 after IV iron treatment.

## 2020-11-09 NOTE — Assessment & Plan Note (Signed)
Uncontrolled though under stress and discomfort with no recheck.  Has been controlled in recent past, no changes at this time.

## 2020-11-09 NOTE — Assessment & Plan Note (Signed)
We discussed her many medicines which alone and in combination are likely contributing to her sense of feeling poorly and to her fall frequency.  She is open to suggestions but doesn't feel that she can do without any at this time.  The ongoing anticoagulation and untreated osteoporosis place her at high risk for significant injury from a fall, and her cognitive function is likely being negatively impacted as well.  No quick solutions today.

## 2020-11-09 NOTE — Assessment & Plan Note (Signed)
Appt scheduled tomorrow with interventional cardiologist Dr. Gwenlyn Found, who knows her well.  She is fearful of any recommended procedures, though would agree if it would relieve her constant leg pain (difficult to distinguish from her severe peripheral neuropathy - likely both coexisting).

## 2020-11-09 NOTE — Assessment & Plan Note (Signed)
Symptoms currently exacerbated by worry about declining health.  She is established with Dr. Theodis Shove and is being treated with duloxetine and bupropion.  Xanax has been prescribed for years for insomnia but she takes a dose as needed during the day for anxiety (not ideal, particularly given her multiple centrally-acting meds and older age) .

## 2020-11-09 NOTE — Assessment & Plan Note (Addendum)
A1c stable at 7.1 on current therapy.  No changes.  CGM data reviewed which shows 75% readings within target range, no lows, and 25% in high range.

## 2020-11-09 NOTE — Assessment & Plan Note (Addendum)
Severe vitamin D deficiency will require high dose replacement.  High fall frequency with high risk for fractures which could be complicated by anticoagulation.  SHe is due for a DEXA and conversation about bisphosphonates.  PRior notes document she was unable to tolerate this in the past.

## 2020-11-09 NOTE — Assessment & Plan Note (Signed)
WAs due for annual lipid test which I did not address unfortunately.  Will catch this next visit.

## 2020-11-09 NOTE — Assessment & Plan Note (Signed)
Pain continues, though generalized all over body.  See entry for fibromyalgia.

## 2020-11-09 NOTE — Assessment & Plan Note (Signed)
NSR on exam today.  On Eliquis (for dual indication with a splenic embolic infarction hx),

## 2020-11-09 NOTE — Assessment & Plan Note (Signed)
>>  ASSESSMENT AND PLAN FOR CHRONIC CHF (CONGESTIVE HEART FAILURE) (HCC) WRITTEN ON 11/09/2020  1:23 PM BY Miguel Aschoff, MD  Currently compensated. Monitor.

## 2020-11-12 ENCOUNTER — Telehealth: Payer: Self-pay | Admitting: Pharmacist

## 2020-11-12 ENCOUNTER — Other Ambulatory Visit: Payer: Self-pay

## 2020-11-12 DIAGNOSIS — J449 Chronic obstructive pulmonary disease, unspecified: Secondary | ICD-10-CM

## 2020-11-12 MED ORDER — IPRATROPIUM-ALBUTEROL 0.5-2.5 (3) MG/3ML IN SOLN: 3.0000 mL | Freq: Four times a day (QID) | RESPIRATORY_TRACT | 3 refills | Status: DC | PRN

## 2020-11-12 NOTE — Telephone Encounter (Signed)
Patient texted results of patient self testing finger stick point of care INR performed at home. INR = 1.8 (target 1.5 - 2.5). Will keep on same regimen: 104m on Monday; 2.542mTu/We/Fr/Sa/Su; 1.2566mn Thursday. Repeat PST FS POC INR on Monday 10-OCT-22.

## 2020-11-19 ENCOUNTER — Telehealth: Payer: Self-pay | Admitting: Pharmacist

## 2020-11-19 NOTE — Telephone Encounter (Signed)
Patient provided results of PST FS POC INR = 1.6 (target range 1.5 - 2.5) while on 13.77m warfarin/wk. Will INCREASE to 19mwarfarin/wk. as:  1 x 2.55m63marfarin all days of week EXCEPT on Mondays, NO warfarin on Mondays. Repeat INR on 17-OCT-22.

## 2020-11-21 ENCOUNTER — Ambulatory Visit: Payer: HMO | Admitting: Behavioral Health

## 2020-11-21 DIAGNOSIS — F419 Anxiety disorder, unspecified: Secondary | ICD-10-CM

## 2020-11-21 DIAGNOSIS — F331 Major depressive disorder, recurrent, moderate: Secondary | ICD-10-CM

## 2020-11-21 NOTE — BH Specialist Note (Signed)
Integrated Behavioral Health via Telemedicine Visit  11/21/2020 [name] [id number]  Number of Integrated Behavioral Health visits: 11 Session Start time: 10:00am  Session End time: 10:30am Total time: 30  Referring Provider: Debera Lat, RN, RD Patient/Family location: Pt @ home in private [facility] Provider location: [facility] All persons participating in visit: Pt & Clinician Types of Service: Individual psychotherapy  I connected with [name] and/or Evans Lance Akerson's  self  via  Telephone or Video Enabled Telemedicine Application  (Video is Caregility application) and verified that I am speaking with the correct person using two identifiers. Discussed confidentiality:  11th visit  I discussed the limitations of telemedicine and the availability of in person appointments.  Discussed there is a possibility of technology failure and discussed alternative modes of communication if that failure occurs.  I discussed that engaging in this telemedicine visit, they consent to the provision of behavioral healthcare and the services will be billed under their insurance.  Patient and/or legal guardian expressed understanding and consented to Telemedicine visit:  11th visit  Presenting Concerns: Patient and/or family reports the following symptoms/concerns: elevated concerns re: BIL's beh & Hx of Nudism Duration of problem: yrs; Severity of problem: moderate  Patient and/or Family's Strengths/Protective Factors: Social connections, Social and Emotional competence, and Concrete supports in place (healthy food, safe environments, etc.)  Goals Addressed: Patient will:  Reduce symptoms of: anxiety, depression, and stress   Increase knowledge and/or ability of: coping skills and stress reduction   Demonstrate ability to: Increase healthy adjustment to current life circumstances  Progress towards Goals: Ongoing  Interventions: Interventions utilized:   Supportive Counseling and Supportive Reflection Standardized Assessments completed:  screeners prn  Patient and/or Family Response: Pt receptive to call & requests future appt  Assessment: Patient currently experiencing elevated concerns for past Hx of BIL's beh & her Str lacking knowledge of this.   Patient may benefit from cont's support for mental health wellness.  Plan: Follow up with behavioral health clinician on : 2-3 wks for 30 min on telehealth Behavioral recommendations: Keep safety a priority Referral(s): [place] (In Clinic)  I discussed the assessment and treatment plan with the patient and/or parent/guardian. They were provided an opportunity to ask questions and all were answered. They agreed with the plan and demonstrated an understanding of the instructions.   They were advised to call back or seek an in-person evaluation if the symptoms worsen or if the condition fails to improve as anticipated.  Donnetta Hutching, LMFT

## 2020-11-23 ENCOUNTER — Telehealth: Payer: Self-pay

## 2020-11-23 NOTE — Telephone Encounter (Signed)
Recv'd records from [place] addressed to [name], no doctor recognized, sending to Primary Care 10/14/22fbg

## 2020-11-26 ENCOUNTER — Encounter: Payer: Self-pay | Admitting: Pharmacist

## 2020-11-26 ENCOUNTER — Telehealth: Payer: Self-pay | Admitting: Pharmacist

## 2020-11-26 DIAGNOSIS — Z7901 Long term (current) use of anticoagulants: Secondary | ICD-10-CM | POA: Diagnosis not present

## 2020-11-26 DIAGNOSIS — I480 Paroxysmal atrial fibrillation: Secondary | ICD-10-CM | POA: Diagnosis not present

## 2020-11-26 NOTE — Telephone Encounter (Signed)
Patient texted results of patient self testing, point of care, finger stick INR = 1.7 (target for the patient is 1.5 - 2.5). Was advised to continue same regimen of warfarin (1x2.23m all days of week--except NO warfarin on Mondays.) Repeat INR 24OCT22. Denies any symptoms of bleeding or embolic disease.

## 2020-11-28 ENCOUNTER — Other Ambulatory Visit: Payer: Self-pay | Admitting: Internal Medicine

## 2020-11-28 DIAGNOSIS — I050 Rheumatic mitral stenosis: Secondary | ICD-10-CM | POA: Diagnosis not present

## 2020-11-28 DIAGNOSIS — J449 Chronic obstructive pulmonary disease, unspecified: Secondary | ICD-10-CM | POA: Diagnosis not present

## 2020-11-28 DIAGNOSIS — M5442 Lumbago with sciatica, left side: Secondary | ICD-10-CM

## 2020-11-28 DIAGNOSIS — G8929 Other chronic pain: Secondary | ICD-10-CM

## 2020-11-28 DIAGNOSIS — I509 Heart failure, unspecified: Secondary | ICD-10-CM | POA: Diagnosis not present

## 2020-11-28 MED ORDER — HYDROCODONE-ACETAMINOPHEN 5-325 MG PO TABS: 2.0000 | ORAL_TABLET | Freq: Three times a day (TID) | ORAL | 0 refills | Status: DC

## 2020-11-28 NOTE — Telephone Encounter (Signed)
Refill Request   HYDROcodone-acetaminophen (NORCO/VICODIN) 5-325 MG tablet   [id number] - [place],  - [place] AT [address] (Ph: [phone])

## 2020-11-30 ENCOUNTER — Telehealth: Payer: Self-pay

## 2020-11-30 NOTE — Telephone Encounter (Signed)
Received TC from patient who complains she is "sweating this morning".  She denies chest pain, denies SOB.  She is laughing during conversation.  RN asked if she has any other symptoms, she states "yeah, I'm a little dizzy".  RN asked if she checked her BS, she states she did and reports 159 BS.  She states she hasn't eaten, had anything to drink, or taken any of her meds this morning. RN advised patient to eat something, drink fluids and take meds as prescribed and if symptoms persist/worsen or she develops SOB, chest pain, confusion to present to urgent care. She verbalized understanding. SChaplin, RN,BSN

## 2020-12-03 ENCOUNTER — Telehealth: Payer: Self-pay | Admitting: Pharmacist

## 2020-12-03 NOTE — Telephone Encounter (Signed)
Patient texted me results of patient self-testing, finger-stick, point of care INR results performed this morning. INR = 1.9 (target goal for the patient is 1.5 - 2.5). Will CONTIUE same regimen. Repeat INR 31-OCT-22. No bleeding reported.

## 2020-12-05 ENCOUNTER — Other Ambulatory Visit: Payer: Self-pay | Admitting: Internal Medicine

## 2020-12-05 DIAGNOSIS — Z1231 Encounter for screening mammogram for malignant neoplasm of breast: Secondary | ICD-10-CM

## 2020-12-10 ENCOUNTER — Telehealth: Payer: Self-pay | Admitting: Pharmacist

## 2020-12-10 NOTE — Telephone Encounter (Signed)
Patient reports PST FS POC INR determination for today is 2.2 (goal 1.5 - 2.5). Will CONTINUE SAME REGIMEN: 36m on Monday; 1 x 2.5 mg warfarin all other days. Denies any symptoms of bleeding.

## 2020-12-11 ENCOUNTER — Telehealth: Payer: Self-pay

## 2020-12-11 ENCOUNTER — Other Ambulatory Visit: Payer: Self-pay

## 2020-12-11 DIAGNOSIS — F419 Anxiety disorder, unspecified: Secondary | ICD-10-CM

## 2020-12-11 DIAGNOSIS — E1140 Type 2 diabetes mellitus with diabetic neuropathy, unspecified: Secondary | ICD-10-CM

## 2020-12-11 DIAGNOSIS — Z794 Long term (current) use of insulin: Secondary | ICD-10-CM

## 2020-12-11 MED ORDER — XULTOPHY 100-3.6 UNIT-MG/ML ~~LOC~~ SOPN: 40.0000 [IU] | PEN_INJECTOR | Freq: Every day | SUBCUTANEOUS | 3 refills | Status: DC

## 2020-12-11 MED ORDER — ALPRAZOLAM 1 MG PO TABS: ORAL_TABLET | ORAL | 0 refills | Status: DC

## 2020-12-11 NOTE — Telephone Encounter (Signed)
RTC, Patient states she fell backwards onto her porch, landed on her buttocks, and scraped up her right arm up.  States her arm did not bleed any, "it looks like a carpet burn".  She does not think she hit anything else.  She states she got dizzy before falling and c/o more dizzy spells lately.    Pt instructed to keep scraped arm clean and dry.  She was instructed to call back if she develops any type of knot or bump in the arm.  RN asked patient if her dizzy spells are more noticeable when she changes positions.  Pt states "yes, I notice them more when I sit up after lying down".  RN discussed orthostatic hypotension w/ patient, encouraged to change positions slowly.  Pt was advised to come in for orthostatic b/p checks, she states "if that is all y'all are going to do, I will wait until my next appt with PCP which is 11/17.  Will route to PCP. Thank you, SChaplin, RN,BSN

## 2020-12-11 NOTE — Telephone Encounter (Signed)
ALPRAZolam (XANAX) 1 MG tablet,  Insulin Degludec-Liraglutide (XULTOPHY) 100-3.6 UNIT-MG/ML SOPN (Expired), refill request @ [place], [place].

## 2020-12-11 NOTE — Telephone Encounter (Signed)
Pt is requesting a call back she stated that she has just fallen ... pt stated that there is no bones showing but she does have a very back cut on her arm .. pt stated it happened 5 mins ago

## 2020-12-12 ENCOUNTER — Other Ambulatory Visit: Payer: Self-pay | Admitting: Interventional Radiology

## 2020-12-12 ENCOUNTER — Ambulatory Visit: Payer: HMO | Admitting: Behavioral Health

## 2020-12-12 DIAGNOSIS — N2889 Other specified disorders of kidney and ureter: Secondary | ICD-10-CM

## 2020-12-12 DIAGNOSIS — F331 Major depressive disorder, recurrent, moderate: Secondary | ICD-10-CM

## 2020-12-12 DIAGNOSIS — F419 Anxiety disorder, unspecified: Secondary | ICD-10-CM

## 2020-12-12 NOTE — BH Specialist Note (Signed)
Integrated Behavioral Health via Telemedicine Visit  12/12/2020 Sharna Gabrys [id number]  Number of Integrated Behavioral Health visits: 12 Session Start time: 11:10am  Session End time: 11:30am Total time: 20  Referring Provider: Debera Lat, RD Patient/Family location: Pt is home in private [facility] Provider location: [facility] All persons participating in visit: Pt & Clinician Types of Service: Individual psychotherapy  I connected with [name] and/or Evans Lance Dohmen's  self  via  Telephone or Video Enabled Telemedicine Application  (Video is Caregility application) and verified that I am speaking with the correct person using two identifiers. Discussed confidentiality:  12th visit  I discussed the limitations of telemedicine and the availability of in person appointments.  Discussed there is a possibility of technology failure and discussed alternative modes of communication if that failure occurs.  I discussed that engaging in this telemedicine visit, they consent to the provision of behavioral healthcare and the services will be billed under their insurance.  Patient and/or legal guardian expressed understanding and consented to Telemedicine visit:  12th visit  Presenting Concerns: Patient and/or family reports the following symptoms/concerns: anx/dep due to stressors w/Family & her situation being isolated Duration of problem: months; Severity of problem: mild; Pt is resilient w/her circumstances  Patient and/or Family's Strengths/Protective Factors: Social connections, Social and Emotional competence, Concrete supports in place (healthy food, safe environments, etc.), and Sense of purpose  Goals Addressed: Patient will:  Reduce symptoms of: anxiety and depression   Increase knowledge and/or ability of: coping skills, healthy habits, and stress reduction   Demonstrate ability to: Increase healthy adjustment to current life  circumstances  Progress towards Goals: Ongoing  Interventions: Interventions utilized:  Solution-Focused Strategies and Supportive Counseling Standardized Assessments completed:  screeners prn  Patient and/or Family Response: Pt receptive to call & requests future ck-in appt  Assessment: Patient currently experiencing one episode of diarrhea this am & suffered a fall on her front porch Tue. Yard Man was present working & she went to fetch a pkg-this caused the fall.  Patient may benefit from cont'd ck-ins for mental health wellness.  Plan: Follow up with behavioral health clinician on : 2-3 wks on telehealth for 30 min Behavioral recommendations: Explore addt'l avenues of supportive contact to enrich your network of resources. Referral(s): [place] (In Clinic) & [facility] for supportive calls.  I discussed the assessment and treatment plan with the patient and/or parent/guardian. They were provided an opportunity to ask questions and all were answered. They agreed with the plan and demonstrated an understanding of the instructions.   They were advised to call back or seek an in-person evaluation if the symptoms worsen or if the condition fails to improve as anticipated.  Donnetta Hutching, LMFT

## 2020-12-13 ENCOUNTER — Other Ambulatory Visit: Payer: Self-pay | Admitting: *Deleted

## 2020-12-13 DIAGNOSIS — C642 Malignant neoplasm of left kidney, except renal pelvis: Secondary | ICD-10-CM

## 2020-12-17 ENCOUNTER — Telehealth: Payer: Self-pay | Admitting: Pharmacist

## 2020-12-17 NOTE — Telephone Encounter (Signed)
Patient texted results of PST FS POC INR on 18m warfarin/wk = 2.2 (Goal 1.5 - 2.5). Will continue same regimen: Su-2.534m-0mgT-2.5mgW-2.5mgTh-2.5mgF-2.5mgSa-2.5mg. Repeat PST FS POC INR 14-NOV-22.  No bleeding reported.

## 2020-12-18 ENCOUNTER — Other Ambulatory Visit: Payer: Self-pay

## 2020-12-18 ENCOUNTER — Ambulatory Visit (HOSPITAL_COMMUNITY)
Admission: RE | Admit: 2020-12-18 | Discharge: 2020-12-18 | Disposition: A | Discharge status: Home / Self Care | Payer: HMO | Source: Ambulatory Visit | Attending: Interventional Radiology | Admitting: Interventional Radiology

## 2020-12-18 DIAGNOSIS — N2889 Other specified disorders of kidney and ureter: Secondary | ICD-10-CM

## 2020-12-18 DIAGNOSIS — K8020 Calculus of gallbladder without cholecystitis without obstruction: Secondary | ICD-10-CM | POA: Diagnosis not present

## 2020-12-18 DIAGNOSIS — I700 Atherosclerosis of aorta: Secondary | ICD-10-CM | POA: Diagnosis not present

## 2020-12-18 DIAGNOSIS — N281 Cyst of kidney, acquired: Secondary | ICD-10-CM | POA: Diagnosis not present

## 2020-12-18 DIAGNOSIS — N200 Calculus of kidney: Secondary | ICD-10-CM | POA: Diagnosis not present

## 2020-12-18 LAB — POCT I-STAT CREATININE: Creatinine, Ser: 1.2 mg/dL — ABNORMAL HIGH (ref 0.44–1.00)

## 2020-12-18 MED ORDER — IOHEXOL 350 MG/ML SOLN
80.0000 mL | Freq: Once | INTRAVENOUS | Status: AC | PRN
  Administered 2020-12-18: 80 mL via INTRAVENOUS

## 2020-12-20 ENCOUNTER — Other Ambulatory Visit: Payer: Self-pay

## 2020-12-20 ENCOUNTER — Ambulatory Visit
Admission: RE | Admit: 2020-12-20 | Discharge: 2020-12-20 | Disposition: A | Discharge status: Home / Self Care | Payer: HMO | Source: Ambulatory Visit | Attending: Interventional Radiology | Admitting: Interventional Radiology

## 2020-12-20 ENCOUNTER — Telehealth: Payer: Self-pay | Admitting: Internal Medicine

## 2020-12-20 DIAGNOSIS — N2889 Other specified disorders of kidney and ureter: Secondary | ICD-10-CM

## 2020-12-20 HISTORY — PX: IR RADIOLOGIST EVAL & MGMT: IMG5224

## 2020-12-20 NOTE — Progress Notes (Signed)
Chief Complaint: Patient was consulted remotely today (TeleHealth) for follow up after left renal cryoablation.   History of Present Illness: [name] is a 70 y.o. female status post prior cryoablation of a left posterior lower pole clear cell renal carcinoma on 09/19/2011 which demonstrated no evidence of recurrence for 5 years after treatment.  She was recently referred for an enlarging upper pole lesion of the left kidney measuring approximately 1.4 cm and demonstrating partial central enhancement suspicious for a slow-growing renal carcinoma.  This was treated successfully with percutaneous cryoablation on 09/12/2020.   She denies any urinary symptoms.  She states that she has been having some frequent small-volume diarrhea.  She has left a message with her gastroenterologist and is also going to be following up with Dr. [name] in about a week.  She is having some low back pain which is chronic.   Past Medical History:  Diagnosis Date   Abnormality of lung on CXR 02/02/2020   Nonspecific finding on CXR ordered by pulmonologist - c/w inflammation vs infection, f/u imaging suggested, Dr. [name] office has been in communication about recommended next steps.   Anxiety 07/24/2010   Aortic atherosclerosis ([place]) 10/19/2014   Seen on CT scan, currently asymptomatic   Arteriovenous malformation of gastrointestinal tract 08/08/2015   Non-bleeding when visualized on capsule endoscopy 06/30/2015    Arthritis    "lower back; hands" (02/19/2018)   Asthma    Asymptomatic cholelithiasis 09/25/2015   Seen on CT scan 08/2015   Carotid artery stenosis; s/p R endarterectomy    s/p right endarterectomy (06/2010) Carotid US (07/2010):  Left: Moderate-to-severe (60-79%) calcific and non-calcific plaque origin and proximal ICA and ECA    Chronic congestive heart failure with left ventricular diastolic dysfunction (HCC) 10/21/2010   Chronic constipation 02/03/2011   Chronic daily headache  01/16/2014   Chronic iron deficiency anemia    Chronic low back pain 10/06/2012   Chronic venous insufficiency 08/04/2012   COPD (chronic obstructive pulmonary disease) with emphysema (HCC)    PFTs 2018: severe obstructive disease, insignif response to bronchiodilator, mild restriction parenchymal pattern, moderately severe diffusion defect. 2014  FEV1 0.92 (40%), ratio 69, 27% increase in FEV1 with BD, TLC 91%, severe airtrapping, DLCO49% On chronic home O2. Pulmonary rehab referral 05/2012    Dyspnea    Fibromyalgia 08/29/2010   Gastroesophageal reflux disease    History of blood transfusion    "several times"  (02/19/2018)   History of clear cell renal cell carcinoma ([place]), in remission 07/21/2011   s/p cryoablation of left RCC in 09/2011 by [name]. Followed by [name]  [facility]) .     History of hiatal hernia    History of mitral valve replacement with bioprosthetic valve due to mitral stenosis 2012   s/p MVR with a 27-mm pericardial porcine valve (Medtronic Mosaic valve, serial #[id number] on 09/20/10, Dr. Prescott Gum)    History of obstructive sleep apnea, resolved 2013   resolved per sleep study 07/2019; no apnea, but did have desaturation.  CPAP no longer necessary.  Nocturnal polysomnography (06/2009): Moderate sleep apnea/ hypopnea syndrome , AHI 17.8 per hour with nonpositional hypopneas. CPAP titration to 12 CWP, AHI 2.4 per hour. On nocturnal CPAP via a small resMed Quattro full-face mask with heated humidifier.    History of pneumonia    "once"  (02/19/2018)   History of seborrheic keratosis 09/28/2015   Hyperlipidemia LDL goal < 100 11/20/2005   Internal and external hemorrhoids without complication 32/54/9826  Lesion of left native kidney 06/01/2020   Incidental finding on recent screening chest CT for lung ca ordered by Dr. Annamaria Boots pulmonology - he has ordered a f/u renal U/S.    Lichen sclerosus of female genitalia 01/12/2017   Migraine    "none in years"  (02/19/2018)   Moderately severe major depression ([place]) 11/19/2005   Nocturnal hypoxia per sleep study 07/2019    Osteoporosis    DEXA 2016: T -2.7; DEXA (12/09/2011): L-spine T -3.7, left hip T -1.4 DEXA (12/2004): L-spine T -2.6, left hip -0.1    Paroxysmal atrial fibrillation ([place]) 10/22/2010   s/p Left atrial maze procedure for paroxysmal atrial fibrillation on 09/20/2010 by Dr Prescott Gum.  Subsequent splenic infarct, decision was made to re-anticoagulate with coumadin, likely life-long as this is the most likely cause of the splenic infarct.    Personal history of colonic polyps 05/14/2011   Colonoscopy (05/2011): 4 mm adenomatous polyp excised endoscopically Colonoscopy (02/2002): Adenomatous polyp excised endoscopically    Personal history of renal cell carcinoma 09/12/2020   Pneumonia    Pulmonary hypertension due to chronic obstructive pulmonary disease ([place]) 04/25/2016   2014 TEE w PA peak pressure 46 mmHg, s/p MV replacement    Right nephrolithiasis, asymptomatic, incidental finding 09/06/2014   5 mm non-obstructing calculus seen on CT scan 09/05/2014    Right ventricular failure ([place]) 04/25/2016   Severe obesity (BMI 35.0-39.9) with comorbidity ([place]) 10/23/2011   Sleep apnea    Tobacco abuse 07/28/2012   Type 2 diabetes mellitus with diabetic neuropathy [facility])     Past Surgical History:  Procedure Laterality Date   CARDIAC CATHETERIZATION     CARDIAC VALVE REPLACEMENT  Aug. 2012   "mitral valve"   CAROTID ENDARTERECTOMY Right 07/04/2010   by [name] for asymptomatic right carotid artery stenosis   CATARACT EXTRACTION W/ INTRAOCULAR LENS  IMPLANT, BILATERAL Bilateral    CHEST TUBE INSERTION  09/24/2010   Dr Lucianne Lei Trigt   COLONOSCOPY  05/12/2011   performed by Dr. Michail Sermon. Showing small internal hemorrhoids, single tubular adenoma polyp   COLONOSCOPY WITH PROPOFOL N/A 05/29/2020   Procedure: COLONOSCOPY WITH PROPOFOL;  Surgeon: Clarene Essex, MD;  Location: [facility] END[facility]COPY;  Service:  Endoscopy;  Laterality: N/A;   CRYOABLATION Left 09/2011   by [name]. Followed by [name]  [facility]) .     DILATION AND CURETTAGE OF UTERUS     ESOPHAGOGASTRODUODENOSCOPY  05/12/2011   performed by Dr. Michail Sermon. Negative for ulcerations, biopsy negative for evidence of celiac sprue   ESOPHAGOGASTRODUODENOSCOPY N/A 06/29/2015   Procedure: ESOPHAGOGASTRODUODENOSCOPY (EGD);  Surgeon: Clarene Essex, MD;  Location: [facility] ENDOSCOPY;  Service: Endoscopy;  Laterality: N/A;   ESOPHAGOGASTRODUODENOSCOPY N/A 03/29/2016   Procedure: ESOPHAGOGASTRODUODENOSCOPY (EGD);  Surgeon: Clarene Essex, MD;  Location: [facility] ENDOSCOPY;  Service: Endoscopy;  Laterality: N/A;   ESOPHAGOGASTRODUODENOSCOPY (EGD) WITH PROPOFOL N/A 05/29/2020   Procedure: ESOPHAGOGASTRODUODENOSCOPY (EGD) WITH PROPOFOL;  Surgeon: Clarene Essex, MD;  Location: [facility] ENDOSCOPY;  Service: Endoscopy;  Laterality: N/A;   FRACTURE SURGERY     GIVENS CAPSULE STUDY N/A 06/30/2015   Procedure: GIVENS CAPSULE STUDY;  Surgeon: Clarene Essex, MD;  Location: [place];  Service: Endoscopy;  Laterality: N/A;   GIVENS CAPSULE STUDY N/A 06/29/2015   Procedure: GIVENS CAPSULE STUDY;  Surgeon: Clarene Essex, MD;  Location: [place];  Service: Endoscopy;  Laterality: N/A;   HEMORRHOID SURGERY  1970s?   "lanced"   HYSTEROSCOPY W/ ENDOMETRIAL ABLATION  06/2001   for persistent post-menopausal bleeding //  by Selinda Orion, M.D.   IR GENERIC HISTORICAL  08/23/2015   IR RADIOLOGIST EVAL & MGMT 08/23/2015 Aletta Edouard, MD [facility] INTERV RAD   IR GENERIC HISTORICAL  04/09/2016   IR RADIOLOGIST EVAL & MGMT 04/09/2016 Aletta Edouard, MD [facility] INTERV RAD   IR RADIOLOGIST EVAL & MGMT  10/07/2016   IR RADIOLOGIST EVAL & MGMT  06/25/2017   IR RADIOLOGIST EVAL & MGMT  07/31/2020   IR RADIOLOGIST EVAL & MGMT  10/02/2020   LEFT HEART CATH AND CORONARY ANGIOGRAPHY N/A 04/21/2016   Procedure: Left Heart Cath and Coronary Angiography;  Surgeon: Lorretta Harp, MD;  Location: [place] CV LAB;  Service: Cardiovascular;  Laterality: N/A;   LIPOMA EXCISION  08/2005   occipital lipoma 1.5cm - by Dr. [name]   LITHOTRIPSY  ~ 2000   MAZE Left 09/20/10   for paroxysmal atrial fibrillation (Dr. Prescott Gum)   MITRAL VALVE REPLACEMENT  09/20/10    with a 27-mm pericardial porcine valve (Medtronic Mosaic valve, serial #[id number]). 09/20/10, Dr Prescott Gum   ORIF CLAVICLE FRACTURE Right 01/2004   by Thana Farr. [name], M.D for Right clavicle nonunion.; "it's got a pin in it"   POLYPECTOMY  05/29/2020   Procedure: POLYPECTOMY;  Surgeon: Clarene Essex, MD;  Location: [facility];  Service: Endoscopy;;   RADIOLOGY WITH ANESTHESIA Left 09/12/2020   Procedure: RADIOLOGY WITH ANESTHESIA- RENAL CRYOABLATION;  Surgeon: Aletta Edouard, MD;  Location: [facility];  Service: Radiology;  Laterality: Left;   REFRACTIVE SURGERY Bilateral    RIGHT HEART CATH N/A 04/23/2016   Procedure: Right Heart Cath;  Surgeon: [name], MD;  Location: [place] CV LAB;  Service: Cardiovascular;  Laterality: N/A;   TONSILLECTOMY     TUBAL LIGATION      Allergies: Dexlansoprazole, Lorazepam, Oxycontin [oxycodone], and Tramadol hcl  Medications: Prior to Admission medications   Medication Sig Start Date End Date Taking? Authorizing Provider  albuterol (PROAIR HFA) 108 (90 Base) MCG/ACT inhaler Inhale 2 puffs into the lungs every 6 (six) hours as needed for shortness of breath. 09/17/20   Sid Falcon, MD  ALPRAZolam [name]) 1 MG tablet TAKE 1 TABLET BY MOUTH EVERY NIGHT AT BEDTIME AS NEEDED FOR INSOMNIA. MAY TAKE 1/2 TABLET BY MOUTH DURING THE DAY FOR ANXIETY 12/11/20   Angelica Pou, MD  benzonatate (TESSALON) 100 MG capsule Take 1 capsule (100 mg total) by mouth 3 (three) times daily as needed for cough. 08/02/20   Angelica Pou, MD  betamethasone dipropionate 0.05 % cream Apply topically 2 (two) times daily. 05/31/20   Angelica Pou, MD  Blood Glucose Monitoring Suppl (ONETOUCH VERIO)  W/DEVICE KIT 1 each by Does not apply route 4 (four) times daily. 07/25/14   [name], MD  buPROPion (WELLBUTRIN XL) 150 MG 24 hr tablet Take 1 tablet (150 mg total) by mouth every morning for 365 doses. 04/12/20 04/12/21  Angelica Pou, MD  Continuous Blood Gluc Sensor (FREESTYLE LIBRE 2 SENSOR) MISC 1 each by Does not apply route 6 (six) times daily. To read 6 times daily with change of sensor every 2 weeks, 90 days worth with 3 refills 10/16/20   Angelica Pou, MD  DULoxetine (CYMBALTA) 60 MG capsule Take 1 capsule (60 mg total) by mouth 2 (two) times daily. 11/08/20 12/08/20  Angelica Pou, MD  fluticasone (FLONASE) 50 MCG/ACT nasal spray Place 2 sprays into both nostrils daily. 02/13/20   Velna Ochs, MD  Fluticasone-Umeclidin-Vilant (TRELEGY ELLIPTA)  100-62.5-25 MCG/INH AEPB Inhale 1 puff into the lungs daily. 02/07/20   Deneise Lever, MD  furosemide (LASIX) 80 MG tablet Take 1 tablet (80 mg total) by mouth every morning AND 0.5 tablets (40 mg total) daily in the afternoon. 09/28/20   Angelica Pou, MD  gabapentin (NEURONTIN) 300 MG capsule Take 2 capsules (600 mg total) by mouth 3 (three) times daily. 06/01/20   Angelica Pou, MD  glucose blood [facility] VERIO) test strip Use to check blood sugar up to 7 times weekly. diag code E11.40. Insulin dependent 03/01/20   Angelica Pou, MD  HYDROcodone-acetaminophen (NORCO/VICODIN) 5-325 MG tablet Take 2 tablets by mouth 3 (three) times daily. 11/28/20   Angelica Pou, MD  Insulin Degludec-Liraglutide (XULTOPHY) 100-3.6 UNIT-MG/ML SOPN Inject 40 Units into the skin daily for 30 doses. 12/11/20 01/10/21  Angelica Pou, MD  Insulin Pen Needle 32G X 4 MM MISC Use to inject insulin up to 6 times a day 08/04/17   [name], MD  ipratropium-albuterol (DUONEB) 0.5-2.5 (3) MG/3ML SOLN Take 3 mLs by nebulization every 6 (six) hours as needed (shortness of breath, wheezing). 11/12/20   Angelica Pou, MD  Lancets Misc. (ACCU-CHEK FASTCLIX LANCET) KIT Check your blood 4 times a day dx code 250.00 insulin requiring 01/19/13   [name], MD  lidocaine (LIDODERM) 5 % Place 1 or 2 patches to painful area of back each day.  Remove & Discard patch within 12 hours or as directed by MD 09/28/20   Angelica Pou, MD  metoprolol succinate (TOPROL-XL) 50 MG 24 hr tablet Take 1 tablet (50 mg total) by mouth daily. Take with or immediately following a meal. 09/17/20   Sid Falcon, MD  nystatin powder Apply 1 application topically 3 (three) times daily as needed. 10/05/20   Angelica Pou, MD  omeprazole (PRILOSEC) 40 MG capsule Take 1 capsule (40 mg total) by mouth in the morning and at bedtime. 11/08/20 12/08/20  Angelica Pou, MD  OXYGEN Inhale 2 L into the lungs at bedtime.    [provider]  potassium chloride SA (KLOR-CON) 20 MEQ tablet Take 2 tablets (40 mEq total) by mouth every morning AND 1 tablet (20 mEq total) daily in the afternoon. 09/28/20   Angelica Pou, MD  PRESCRIPTION MEDICATION Inhale into the lungs at bedtime. CPAP    [provider]  rosuvastatin (CRESTOR) 20 MG tablet TAKE 1 TABLET BY MOUTH ONCE EVERY NIGHT AT BEDTIME 06/01/20   Angelica Pou, MD  warfarin (COUMADIN) 2.5 MG tablet Take 2.5 mg by mouth as directed.    [provider]     Family History  Problem Relation Age of Onset   Peptic Ulcer Disease Father    Heart attack Father 50       Died of MI at age 30   Heart attack Brother 39       Died of MI at age 52   Obesity Brother    Pneumonia Mother    Healthy Sister    Lupus Daughter    Obsessive Compulsive Disorder Daughter     Social History   Socioeconomic History   Marital status: Divorced    Spouse name: Not on file   Number of children: Not on file   Years of education: Not on file   Highest education level: Not on file  Occupational History   Not on file  Tobacco Use   Smoking status:  Every  Day    Packs/day: 1.00    Years: 55.00    Pack years: 55.00    Types: Cigarettes    Start date: 11/01/1962   Smokeless tobacco: Never  Vaping Use   Vaping Use: Never used  Substance and Sexual Activity   Alcohol use: Not Currently    Comment: 02/19/2018 "nothing since 1999"   Drug use: Not Currently    Types: Cocaine, Marijuana    Comment: 02/19/2018 "nothing since the 1990s"   Sexual activity: Not Currently    Birth control/protection: Post-menopausal  Other Topics Concern   Not on file  Social History Narrative   Lives [place] in [place] ([place])   Worked at [employer] for 18 years   No car   Social Determinants of Radio broadcast assistant Strain: Not on [employer] Insecurity: No Food Insecurity   Worried About Charity fundraiser in the Last Year: Never true   Arboriculturist in the Last Year: Never true  Transportation Needs: No Transportation Needs   Lack of Transportation (Medical): No   Lack of Transportation (Non-Medical): No  Physical Activity: Not on file  Stress: Not on file  Social Connections: Not on file    Review of Systems  Constitutional: Negative.   Respiratory: Negative.    Cardiovascular: Negative.   Gastrointestinal:  Positive for diarrhea. Negative for abdominal pain and blood in stool.  Genitourinary: Negative.   Musculoskeletal:  Positive for back pain.  Neurological: Negative.    Review of Systems: A 12 point ROS discussed and pertinent positives are indicated in the HPI above.  All other systems are negative.  Physical Exam No direct physical exam was performed (except for noted visual exam findings with Video Visits).   Vital Signs: There were no vitals taken for this visit.  Imaging: CT ABDOMEN W WO CONTRAST  Result Date: 12/20/2020 CLINICAL DATA:  Follow-up left renal cryoablation EXAM: CT ABDOMEN WITHOUT AND WITH CONTRAST TECHNIQUE: Multidetector CT imaging of the abdomen was performed following the  standard protocol before and following the bolus administration of intravenous contrast. CONTRAST:  39m OMNIPAQUE IOHEXOL 350 MG/ML SOLN COMPARISON:  CT-guided left renal cryoablation, 09/12/2020, MR abdomen, 06/24/2020 FINDINGS: Lower chest: No acute abnormality. Hepatobiliary: No focal liver abnormality is seen. Tiny gallstones in the gallbladder. No gallbladder wall thickening, or biliary dilatation. Pancreas: Unremarkable. No pancreatic ductal dilatation or surrounding inflammatory changes. Spleen: Normal in size without focal abnormality. Adrenals/Urinary Tract: Adrenal glands are unremarkable. There is no residual contrast enhancement of an ablated lesion of the peripheral superior pole of the left kidney (series 6, image 34). Additional calcified more chronic ablation lesion of the posterior inferior pole of the left kidney (series 6, image 52). Small bilateral nonenhancing renal cysts. Small nonobstructive calculi of the inferior poles. No hydronephrosis. Stomach/Bowel: Stomach is within normal limits. Appendix appears normal. No evidence of bowel wall thickening, distention, or inflammatory changes. Vascular/Lymphatic: Aortic atherosclerosis. No enlarged abdominal lymph nodes. Other: No abdominal wall hernia or abnormality. Musculoskeletal: No fracture is seen. IMPRESSION: 1. There is no residual contrast enhancement of an ablated lesion of the peripheral superior pole of the left kidney. No evidence of residual or recurrent contrast enhancement, lymphadenopathy, or metastatic disease in the abdomen. 2. Additional calcified more chronic ablation lesion of the posterior inferior pole of the left kidney. 3. Nonobstructive bilateral nephrolithiasis. 4. Cholelithiasis. Aortic Atherosclerosis (ICD10-I70.0). Electronically Signed   By: ADelanna AhmadiM.D.   On: 12/20/2020 07:40  Labs:  CBC: Recent Labs    08/19/20 0050 09/04/20 0937 09/12/20 0716 09/13/20 0554  WBC 7.4 7.5 7.8 8.8  HGB 9.3* 11.9*  11.4* 11.0*  HCT 31.6* 40.0 38.4 37.5  PLT 179 260 206 196    COAGS: Recent Labs    08/18/20 1603 08/19/20 0050 09/12/20 0716  INR 1.6* 1.6* 1.1  APTT  --   --  27    BMP: Recent Labs    08/19/20 0050 09/04/20 0937 09/12/20 0716 09/13/20 0554 12/18/20 1633  NA 135 138 141 141  --   K 4.1 4.8 4.1 4.2  --   CL 101 101 100 106  --   CO2 27 31 32 28  --   GLUCOSE 136* 176* 103* 174*  --   BUN _0 --   CALCIUM 9.0 9.5 9.7 9.0  --   CREATININE 1.10* 0.87 1.02* 1.01* 1.20*  GFRNONAA 54* >60 59* 60*  --     LIVER FUNCTION TESTS: Recent Labs    04/18/20 1555  BILITOT 0.3  AST 10  ALT 6  ALKPHOS 45  PROT 6.7  ALBUMIN 3.8     Assessment and Plan:  I spoke with [name] over the phone.  The follow-up CT dated 12/18/2020 demonstrates a well-circumscribed ablation defect at the level of the lateral upper pole cortex with ablation of the previously noted enhancing upper pole lesion and no evidence of residual enhancement.  No evidence of complication following the recent ablation procedure.  Chronic ablation defect at the posterior lower pole of the left kidney demonstrates stable appearance with areas of calcification and no suggestion of carcinoma recurrence.  I recommended a follow-up CT in 1 year with check of renal function at that time.   Electronically Signed: Azzie Roup 12/20/2020, 8:07 AM    I spent a total of  10 Minutes in remote  clinical consultation, greater than 50% of which was counseling/coordinating care status post prior cryoablation of left renal masses.    Visit type: Audio only (telephone). Audio (no video) only due to patient's lack of internet/smartphone capability. Alternative for in-person consultation at [facility], [address], [place], [place]. This visit type was conducted due to national recommendations for restrictions regarding the COVID-19 Pandemic (e.g. social distancing).  This format is felt to be most  appropriate for this patient at this time.  All issues noted in this document were discussed and addressed.

## 2020-12-24 ENCOUNTER — Telehealth: Payer: Self-pay | Admitting: Pharmacist

## 2020-12-24 DIAGNOSIS — Z7901 Long term (current) use of anticoagulants: Secondary | ICD-10-CM | POA: Diagnosis not present

## 2020-12-24 DIAGNOSIS — I480 Paroxysmal atrial fibrillation: Secondary | ICD-10-CM | POA: Diagnosis not present

## 2020-12-24 NOTE — Telephone Encounter (Signed)
Patient provided me results of PST FS POC INR = 2.7 (target 1.5 - 2.5). Will REDUCE from 93m warfarin/wk to 13.767mwarfarin/wk. as shown:  Su-2.58m21m0mgT-1.258mgW-2.58mgTh-2.58mgF-2.58mgSa-2.58mg. Repeat PST FS POC INR 21-NOV-22. No changes in diet, medications, no symptoms of bleeding or embolic disease endorsed by the patient.
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
## 2020-12-27 ENCOUNTER — Ambulatory Visit (INDEPENDENT_AMBULATORY_CARE_PROVIDER_SITE_OTHER): Payer: HMO | Admitting: Internal Medicine

## 2020-12-27 VITALS — BP 134/47 | HR 69 | Temp 98.2°F | Ht 62.0 in | Wt 165.0 lb

## 2020-12-27 DIAGNOSIS — Z85528 Personal history of other malignant neoplasm of kidney: Secondary | ICD-10-CM

## 2020-12-27 DIAGNOSIS — I739 Peripheral vascular disease, unspecified: Secondary | ICD-10-CM

## 2020-12-27 DIAGNOSIS — G479 Sleep disorder, unspecified: Secondary | ICD-10-CM

## 2020-12-27 DIAGNOSIS — M810 Age-related osteoporosis without current pathological fracture: Secondary | ICD-10-CM | POA: Diagnosis not present

## 2020-12-27 DIAGNOSIS — F321 Major depressive disorder, single episode, moderate: Secondary | ICD-10-CM

## 2020-12-27 DIAGNOSIS — Z7901 Long term (current) use of anticoagulants: Secondary | ICD-10-CM

## 2020-12-27 DIAGNOSIS — S8391XA Sprain of unspecified site of right knee, initial encounter: Secondary | ICD-10-CM

## 2020-12-27 DIAGNOSIS — D509 Iron deficiency anemia, unspecified: Secondary | ICD-10-CM | POA: Diagnosis not present

## 2020-12-27 DIAGNOSIS — B353 Tinea pedis: Secondary | ICD-10-CM

## 2020-12-27 NOTE — Patient Instructions (Signed)
[place], Please look for whole body copper bracelets.  Ha ha!  Glad the bracelet is working!  Today we are checking your blood counts and iron.  Thank you for telling me about the ice eating.  Please try to eliminate all dairy for a week and see if your bowel habits improve.  Always glad to catch up with you!  You look great, even when you don't feel great.  Take care and stay well,  Dr. [name]

## 2020-12-28 LAB — CBC
Hematocrit: 37.4 % (ref 34.0–46.6)
Hemoglobin: 11.8 g/dL (ref 11.1–15.9)
MCH: 27.4 pg (ref 26.6–33.0)
MCHC: 31.6 g/dL (ref 31.5–35.7)
MCV: 87 fL (ref 79–97)
Platelets: 214 10*3/uL (ref 150–450)
RBC: 4.3 x10E6/uL (ref 3.77–5.28)
RDW: 14.8 % (ref 11.7–15.4)
WBC: 7.6 10*3/uL (ref 3.4–10.8)

## 2020-12-28 LAB — IRON AND TIBC
Iron Saturation: 11 % — ABNORMAL LOW (ref 15–55)
Iron: 46 ug/dL (ref 27–139)
Total Iron Binding Capacity: 423 ug/dL (ref 250–450)
UIBC: 377 ug/dL — ABNORMAL HIGH (ref 118–369)

## 2020-12-28 LAB — VITAMIN D 25 HYDROXY (VIT D DEFICIENCY, FRACTURES): Vit D, 25-Hydroxy: 36.3 ng/mL (ref 30.0–100.0)

## 2020-12-28 LAB — FERRITIN: Ferritin: 12 ng/mL — ABNORMAL LOW (ref 15–150)

## 2020-12-29 DIAGNOSIS — I050 Rheumatic mitral stenosis: Secondary | ICD-10-CM | POA: Diagnosis not present

## 2020-12-29 DIAGNOSIS — I509 Heart failure, unspecified: Secondary | ICD-10-CM | POA: Diagnosis not present

## 2020-12-29 DIAGNOSIS — J449 Chronic obstructive pulmonary disease, unspecified: Secondary | ICD-10-CM | POA: Diagnosis not present

## 2020-12-31 ENCOUNTER — Telehealth: Payer: Self-pay | Admitting: Pharmacist

## 2020-12-31 NOTE — Assessment & Plan Note (Signed)
Surveillance CT 12/20/2020: "No residual contrast enhancement of an ablated lesion of the peripheral superior pole of the left kidney".

## 2020-12-31 NOTE — Assessment & Plan Note (Signed)
Appointment with Dr. Alvester Chou rescheduled to January 2023.  No change in symptoms or in physical exam.  Feet remain warm, pulseless, ruborous, with cyanotic warm toes.  No insufficiency lesions.

## 2020-12-31 NOTE — Assessment & Plan Note (Signed)
Improved scale-she has been using cream.

## 2020-12-31 NOTE — Assessment & Plan Note (Addendum)
No traumatic falls since last visit on apixaban.  At this time, benefit outweighs risk.

## 2020-12-31 NOTE — Assessment & Plan Note (Signed)
Completely resolved.

## 2020-12-31 NOTE — Assessment & Plan Note (Signed)
Irregular sleeping pattern persists.  She falls asleep easily during the day but does not sustain restful sleep at night.  Medications are suspected to be playing a role though she does not feel that she could tolerate any dose adjustments at this time.

## 2020-12-31 NOTE — Assessment & Plan Note (Signed)
High dose vit D replacement completed; check level today.  DEXA ordered.

## 2020-12-31 NOTE — Assessment & Plan Note (Signed)
"  I sleep to avoid reality".  Her negative feelings stem from her declining health and her loss of independence as well as from chronic pain.  She is established with therapist Dr. Carolynne Edouard.  She does not experience suicidal thoughts.  Continue pain dosing for duloxetine, 60 mg twice daily (she noted increased symptoms when dose was reduced to 60 mg daily) and bupropion 150 mg daily.  I will discuss with her increasing bupropion dose.

## 2020-12-31 NOTE — Assessment & Plan Note (Signed)
Recheck hemoglobin, iron studies given increased pica.

## 2020-12-31 NOTE — Telephone Encounter (Signed)
Patient contacted me with results of her PST, FS, POC INR = 1.90 (target 1.5 - 2.5). Will maintain same regimen: Using 2.5 mg strength-green warfarin tablets, take 1 tablet on Su/We/Th/Fr/Sa; 1/2 x 2.43m on Tuesday; NO warfarin on Monday. Repeat INR 28NOV22 and call results to me. No bleeding symptoms endorsed by the patient.

## 2021-01-01 ENCOUNTER — Telehealth: Payer: Self-pay | Admitting: Internal Medicine

## 2021-01-01 NOTE — Telephone Encounter (Signed)
Left vm for [name] stating her vit D level looked good and that iron levels had declined again, and that I would be ordering iron infusion when I get back after Thanksgiving.

## 2021-01-07 ENCOUNTER — Telehealth: Payer: Self-pay | Admitting: Pharmacist

## 2021-01-07 NOTE — Telephone Encounter (Signed)
Patient shared results of FS POC PST INR 2.1 (target 1.5 - 2.5) on 13.59m warfarin/wk as:  Su-2.569m-0mgT-1.25mgW-2.5mgTh-2.5mgF-2.5mgSa-2.5mg. Was advised to CONTINUE this regimen and self-test again 5-DEC-22. No bleeding symptoms endorsed.

## 2021-01-08 ENCOUNTER — Ambulatory Visit: Payer: HMO | Admitting: Behavioral Health

## 2021-01-08 DIAGNOSIS — F419 Anxiety disorder, unspecified: Secondary | ICD-10-CM

## 2021-01-08 DIAGNOSIS — F331 Major depressive disorder, recurrent, moderate: Secondary | ICD-10-CM

## 2021-01-08 NOTE — BH Specialist Note (Signed)
Integrated Behavioral Health via Telemedicine Visit  01/08/2021 [name] [id number]  Number of Integrated Behavioral Health visits: 13 Session Start time: 11:30am  Session End time: 12:00pm Total time: 30  Referring Provider: Debera Lat, RD Patient/Family location: Pt is home in private [facility] Provider location: [facility] Office All persons participating in visit: Pt & Clinician Types of Service: Individual psychotherapy  I connected with [name] and/or [name]'s  self  via  Telephone or Video Enabled Telemedicine Application  (Video is Caregility application) and verified that I am speaking with the correct person using two identifiers. Discussed confidentiality:  13th visit  I discussed the limitations of telemedicine and the availability of in person appointments.  Discussed there is a possibility of technology failure and discussed alternative modes of communication if that failure occurs.  I discussed that engaging in this telemedicine visit, they consent to the provision of behavioral healthcare and the services will be billed under their insurance.  Patient and/or legal guardian expressed understanding and consented to Telemedicine visit: Yes   Presenting Concerns: Patient and/or family reports the following symptoms/concerns: reduced anx/dep Duration of problem: yrs; Severity of problem: mild  Patient and/or Family's Strengths/Protective Factors: Concrete supports in place (healthy food, safe environments, etc.) and Sense of purpose  Goals Addressed: Patient will:  Reduce symptoms of: anxiety, depression, and stress   Increase knowledge and/or ability of: coping skills   Demonstrate ability to: Increase healthy adjustment to current life circumstances  Progress towards Goals: Ongoing  Interventions: Interventions utilized:  Supportive Counseling Standardized Assessments completed:  screeners prn  Patient and/or Family  Response: Pt receptive to call & requests future appt ck-in  Assessment: Patient currently experiencing reduction in anx/dep, adequate ADL function.   Patient may benefit from cont'd ck-in calls for mental health wellness.  Plan: Follow up with behavioral health clinician on : 3 wks on telehealth for 30 min Behavioral recommendations: None @ this time Referral(s): [name] (In Clinic) and [facility] Info & Referral  I discussed the assessment and treatment plan with the patient and/or parent/guardian. They were provided an opportunity to ask questions and all were answered. They agreed with the plan and demonstrated an understanding of the instructions.   They were advised to call back or seek an in-person evaluation if the symptoms worsen or if the condition fails to improve as anticipated.  Donnetta Hutching, LMFT

## 2021-01-10 ENCOUNTER — Ambulatory Visit
Admission: RE | Admit: 2021-01-10 | Discharge: 2021-01-10 | Disposition: A | Discharge status: Home / Self Care | Payer: HMO | Source: Ambulatory Visit | Attending: Internal Medicine | Admitting: Internal Medicine

## 2021-01-10 DIAGNOSIS — Z1231 Encounter for screening mammogram for malignant neoplasm of breast: Secondary | ICD-10-CM

## 2021-01-14 ENCOUNTER — Telehealth: Payer: Self-pay | Admitting: Pharmacist

## 2021-01-14 ENCOUNTER — Other Ambulatory Visit: Payer: Self-pay | Admitting: *Deleted

## 2021-01-14 DIAGNOSIS — M5442 Lumbago with sciatica, left side: Secondary | ICD-10-CM

## 2021-01-14 DIAGNOSIS — K449 Diaphragmatic hernia without obstruction or gangrene: Secondary | ICD-10-CM

## 2021-01-14 DIAGNOSIS — M545 Low back pain, unspecified: Secondary | ICD-10-CM

## 2021-01-14 DIAGNOSIS — G8929 Other chronic pain: Secondary | ICD-10-CM

## 2021-01-14 DIAGNOSIS — K219 Gastro-esophageal reflux disease without esophagitis: Secondary | ICD-10-CM

## 2021-01-14 MED ORDER — LIDOCAINE 5 % EX PTCH: MEDICATED_PATCH | CUTANEOUS | 2 refills | Status: DC

## 2021-01-14 MED ORDER — HYDROCODONE-ACETAMINOPHEN 5-325 MG PO TABS: 2.0000 | ORAL_TABLET | Freq: Three times a day (TID) | ORAL | 0 refills | Status: DC

## 2021-01-14 NOTE — Telephone Encounter (Signed)
Patient has shared results of PST FS POC INR determination for today = 2.2 (target 1.5 - 2.5). Will CONTINUE SAME REGIMEN. 1x2.64m green-warfarin tablets on Su/We/Th/Fri/Sat; 1/2 x 2.552mon Tuesday. No warfarin on Monday. Repeat INR 12-DEC-22. No signs or symptoms of bleeding or embolic events endorsed by the patient.

## 2021-01-14 NOTE — Telephone Encounter (Signed)
Patient calling stating that needs refill done as soon as possible for the  Hydrocodone -Acetaminophen so that her sister can pick up the medication for her today as she is out.  Will await response from Dr. [name] and Attending.

## 2021-01-14 NOTE — Telephone Encounter (Signed)
Cal from patient requesting refills for Hydrocodone.  Last refill 11/28/2020 LV was 12/27/2020 Next visit -not scheduled. Last Toix was 112/06/1998. Request refill as soon as possible so sister can pick up this morning.  Patient would like refills on Lidocaine Patches and Ondansetron to go to [facility]. She is beginning to have nausea again. Not on current med list.

## 2021-01-14 NOTE — Telephone Encounter (Signed)
I have approved her refills. Please let patient know our policy is 48 hours for refill request. In the future, please call in advance with enough time to allow [place] to process her requests.

## 2021-01-15 ENCOUNTER — Other Ambulatory Visit: Payer: Self-pay | Admitting: Gastroenterology

## 2021-01-15 ENCOUNTER — Other Ambulatory Visit: Payer: Self-pay | Admitting: Internal Medicine

## 2021-01-15 ENCOUNTER — Ambulatory Visit
Admission: RE | Admit: 2021-01-15 | Discharge: 2021-01-15 | Disposition: A | Discharge status: Home / Self Care | Payer: HMO | Source: Ambulatory Visit | Attending: Gastroenterology | Admitting: Gastroenterology

## 2021-01-15 DIAGNOSIS — K219 Gastro-esophageal reflux disease without esophagitis: Secondary | ICD-10-CM | POA: Diagnosis not present

## 2021-01-15 DIAGNOSIS — R194 Change in bowel habit: Secondary | ICD-10-CM

## 2021-01-15 DIAGNOSIS — M810 Age-related osteoporosis without current pathological fracture: Secondary | ICD-10-CM

## 2021-01-15 DIAGNOSIS — Z86010 Personal history of colonic polyps: Secondary | ICD-10-CM | POA: Diagnosis not present

## 2021-01-15 DIAGNOSIS — R109 Unspecified abdominal pain: Secondary | ICD-10-CM | POA: Diagnosis not present

## 2021-01-21 ENCOUNTER — Telehealth: Payer: Self-pay | Admitting: Pharmacist

## 2021-01-21 DIAGNOSIS — Z7901 Long term (current) use of anticoagulants: Secondary | ICD-10-CM | POA: Diagnosis not present

## 2021-01-21 DIAGNOSIS — I480 Paroxysmal atrial fibrillation: Secondary | ICD-10-CM | POA: Diagnosis not present

## 2021-01-21 NOTE — Telephone Encounter (Signed)
Patient reports PST FS POC INR today as 2.4 (target 1.5 - 2.5). Will decrease from 13.12m/wk to 12.537mwarfarin/wk, repeat PST FS POC INR 19-DEC-22. Does not endorse any symptoms or signs of bleeding. Has started a probiotic she states as the only new medication she is taking.

## 2021-01-22 MED ORDER — OMEPRAZOLE 40 MG PO CPDR: 40.0000 mg | DELAYED_RELEASE_CAPSULE | Freq: Two times a day (BID) | ORAL | 3 refills | Status: DC

## 2021-01-22 MED ORDER — ONDANSETRON HCL 4 MG PO TABS: 4.0000 mg | ORAL_TABLET | Freq: Three times a day (TID) | ORAL | 0 refills | Status: DC | PRN

## 2021-01-22 NOTE — Addendum Note (Signed)
Addended by: Dorian Pod A on: 01/22/2021 05:00 PM   Modules accepted: Orders

## 2021-01-23 ENCOUNTER — Telehealth: Payer: Self-pay

## 2021-01-23 NOTE — Telephone Encounter (Signed)
RTC to patient wanted to know if refill on Zofran and Lidocaine Patches has been done.  Prescription were sent to [facility] and PA is being done for the Lidocaine.  Patient state saw her GI doctor and was placed on Ib Gard, Benefiber  and another Probiotic.  Will await call for follow up appointment with Dr. [name].

## 2021-01-23 NOTE — Telephone Encounter (Signed)
Requesting to speak with a nurse about something. Please call pt back.

## 2021-01-23 NOTE — Telephone Encounter (Signed)
Pt call back to state, " My stomach is really upset and I'm going to go and  lay down.  Please don't call back until after 2 pm".

## 2021-01-23 NOTE — Telephone Encounter (Signed)
PA for pt ( LIDOCAINE 5% patch) came through on cover my meds  was submitted along with office notes from 11/17.Marland Kitchen awaiting approval or denial

## 2021-01-23 NOTE — Telephone Encounter (Signed)
DECISION :   Outcome  Approved today  PA Case: [id number],   Status: Approved,   Coverage Starts on: 01/23/2021 12:00:00 AM, Coverage Ends on: 01/23/2022 12:00:00 AM.  Drug  Lidocaine 5% patches  Form  [facility] [employer] 4-Part Electronic PA Form (2017 NCPDP)    (COPY SENT TO PHARMACY ALSO )

## 2021-01-25 ENCOUNTER — Other Ambulatory Visit: Payer: Self-pay

## 2021-01-25 DIAGNOSIS — F321 Major depressive disorder, single episode, moderate: Secondary | ICD-10-CM

## 2021-01-25 DIAGNOSIS — I480 Paroxysmal atrial fibrillation: Secondary | ICD-10-CM

## 2021-01-25 MED ORDER — METOPROLOL SUCCINATE ER 50 MG PO TB24
50.0000 mg | ORAL_TABLET | Freq: Every day | ORAL | 3 refills | Status: DC
  Filled 2021-07-25: qty 90, 90d supply, fill #0
  Filled 2021-09-07 – 2021-10-22 (×2): qty 90, 90d supply, fill #1

## 2021-01-25 MED ORDER — WARFARIN SODIUM 2.5 MG PO TABS: 2.5000 mg | ORAL_TABLET | ORAL | 5 refills | Status: DC

## 2021-01-25 MED ORDER — BUPROPION HCL ER (XL) 150 MG PO TB24
150.0000 mg | ORAL_TABLET | ORAL | 3 refills | Status: DC
  Filled 2021-04-25 – 2021-07-25 (×2): qty 90, 90d supply, fill #0
  Filled 2021-09-07 – 2021-10-22 (×2): qty 90, 90d supply, fill #1

## 2021-01-25 NOTE — Telephone Encounter (Signed)
Patient called in stating she would like [facility] removed from list. Only wants to use [facility] for mail order. Requesting refills on warfarin and metoprolol. [facility] could not find Rx for metoprolol written 09/17/20. States she reports her INR to Dr. Elie Confer every Monday.  Also, requesting call from PCP. States her feet are hurting and she was not able to get an appt with PCP till March 2023. Offered appt with Resident and she states she not yet ready to see a Resident.

## 2021-01-25 NOTE — Telephone Encounter (Signed)
Patient called back states [facility] cannot find bupropion either. Would like this sent to [facility].

## 2021-01-28 ENCOUNTER — Telehealth: Payer: Self-pay | Admitting: Pharmacist

## 2021-01-28 ENCOUNTER — Other Ambulatory Visit: Payer: Self-pay | Admitting: Internal Medicine

## 2021-01-28 MED ORDER — FLUTICASONE-UMECLIDIN-VILANT 100-62.5-25 MCG/ACT IN AEPB
1.0000 | INHALATION_SPRAY | Freq: Every day | RESPIRATORY_TRACT | 4 refills | Status: DC
  Filled 2021-07-25: qty 180, 90d supply, fill #0
  Filled 2021-09-07 – 2021-09-26 (×2): qty 180, 90d supply, fill #1
  Filled 2021-12-22 – 2022-01-24 (×3): qty 180, 90d supply, fill #2

## 2021-01-28 NOTE — Telephone Encounter (Signed)
Patient provided PST FS POC INR Result = 2.2 (goal 1.5 - 2.5). Will CONTINUE 1 of your 2.28m strength green warfarin tablets Su/We/Th/Sa; 1/2 tablet on Tu/Th; NO warfarin on Mondays. Repeat [id number] No bleeding reported, no new medications, no missed doses. Adequate supply of warfarin on-hand.

## 2021-01-29 ENCOUNTER — Other Ambulatory Visit: Payer: Self-pay | Admitting: *Deleted

## 2021-01-29 DIAGNOSIS — Z953 Presence of xenogenic heart valve: Secondary | ICD-10-CM

## 2021-01-29 DIAGNOSIS — D509 Iron deficiency anemia, unspecified: Secondary | ICD-10-CM | POA: Diagnosis not present

## 2021-01-29 DIAGNOSIS — I480 Paroxysmal atrial fibrillation: Secondary | ICD-10-CM

## 2021-01-29 DIAGNOSIS — R140 Abdominal distension (gaseous): Secondary | ICD-10-CM | POA: Diagnosis not present

## 2021-01-29 DIAGNOSIS — Z86010 Personal history of colonic polyps: Secondary | ICD-10-CM | POA: Diagnosis not present

## 2021-01-29 DIAGNOSIS — K219 Gastro-esophageal reflux disease without esophagitis: Secondary | ICD-10-CM | POA: Diagnosis not present

## 2021-01-29 NOTE — Telephone Encounter (Signed)
Call from [facility] order pharmacy - requesting 90 day supply for Warfarin. Thanks

## 2021-01-30 ENCOUNTER — Telehealth: Payer: Self-pay | Admitting: Dietician

## 2021-01-30 ENCOUNTER — Ambulatory Visit: Payer: HMO | Admitting: Podiatry

## 2021-01-30 MED ORDER — WARFARIN SODIUM 2.5 MG PO TABS: 2.5000 mg | ORAL_TABLET | ORAL | 3 refills | Status: DC

## 2021-01-30 NOTE — Telephone Encounter (Signed)
Melissa with [facility] [phone]) called stating they need the directions in the Sig for Warfarin, as well as 90 day supply as this is Psychologist, clinical.

## 2021-01-30 NOTE — Telephone Encounter (Signed)
Asks for triage nurse, Lauren, to call her about transferring prescription as soon as possible.

## 2021-01-30 NOTE — Telephone Encounter (Signed)
Patient calling back to speak with PCP regarding foot pain. Please see message below.

## 2021-01-31 ENCOUNTER — Other Ambulatory Visit: Payer: Self-pay | Admitting: Pharmacist

## 2021-01-31 ENCOUNTER — Telehealth: Payer: Self-pay | Admitting: Internal Medicine

## 2021-01-31 ENCOUNTER — Ambulatory Visit: Payer: HMO | Admitting: Behavioral Health

## 2021-01-31 DIAGNOSIS — F331 Major depressive disorder, recurrent, moderate: Secondary | ICD-10-CM

## 2021-01-31 DIAGNOSIS — F419 Anxiety disorder, unspecified: Secondary | ICD-10-CM

## 2021-01-31 MED ORDER — WARFARIN SODIUM 2.5 MG PO TABS: ORAL_TABLET | ORAL | 2 refills | Status: DC

## 2021-01-31 NOTE — Telephone Encounter (Signed)
[name] with [facility] calling back again for directions in Sig and 90 day supply of warfarin. States patient's insurance will not allow them to provide any less than 90 day supply.

## 2021-01-31 NOTE — Telephone Encounter (Signed)
Was contacted by Triage Nurse to provide prescription for the patient that reflects the current instructions, and a ninety-day supply.

## 2021-01-31 NOTE — Telephone Encounter (Signed)
Ms. Vanzandt requested a call to discuss foot pain.  I reached her at home by phone.  She describes painful skin of the posterior heels, one worse than the other, which she began self treating with peroxide.  Hurts to walk and to put pressure on the area.  She thinks there is a crack in the skin by the way it feels to touch, and wonders if it is related to her chronic tinea pedis.  Tried spraying her antifungal spray onto the area "but it didn't do anything".  No injury to the area, which does not receive sustained pressure.  No new footwear.  Has not applied any emollient.  She texted a photo of her heel which shows a linear crack/fissure on lateral posterior skin of R foot.  She will apply heavy moisturizing cream and will wear shoes without a back for now.  Has an appt with podiatrist in 2 wks.  Next appt with me not until 04/2021.   She expressed appreciation for the f/u.

## 2021-01-31 NOTE — BH Specialist Note (Signed)
Integrated Behavioral Health via Telemedicine Visit  01/31/2021 [name] [id number]  Number of Integrated Behavioral Health visits: 14 Session Start time: 11:00am  Session End time: 11:30am Total time: 30  Referring Provider: Debera Lat, RD Patient/Family location: Pt is home in private [facility] Provider location: [facility] Office All persons participating in visit: Pt & Clinician Types of Service: Individual psychotherapy  I connected with [name] and/or [name]'s  self  via  Telephone or Video Enabled Telemedicine Application  (Video is Caregility application) and verified that I am speaking with the correct person using two identifiers. Discussed confidentiality:  14th visit  I discussed the limitations of telemedicine and the availability of in person appointments.  Discussed there is a possibility of technology failure and discussed alternative modes of communication if that failure occurs.  I discussed that engaging in this telemedicine visit, they consent to the provision of behavioral healthcare and the services will be billed under their insurance.  Patient and/or legal guardian expressed understanding and consented to Telemedicine visit:  14th visit  Presenting Concerns: Patient and/or family reports the following symptoms/concerns: reduction in anx/dep due to positivity around the Christmas Season Duration of problem: years; Severity of problem: moderate  Patient and/or Family's Strengths/Protective Factors: Social and Emotional competence, Concrete supports in place (healthy food, safe environments, etc.), Sense of purpose, and Physical Health (exercise, healthy diet, medication compliance, etc.)  Goals Addressed: Patient will:  Reduce symptoms of: anxiety, depression, and mood instability   Increase knowledge and/or ability of: coping skills and healthy habits   Demonstrate ability to: Increase healthy adjustment to current life  circumstances  Progress towards Goals: Ongoing  Interventions: Interventions utilized:  Solution-Focused Strategies and Supportive Counseling Standardized Assessments completed:  screeners prn  Patient and/or Family Response: Pt receptive to call today & requests future appts  Assessment: Patient currently experiencing a reduction in anx/dep bc Family has been invld & active during this Holiday Season.   Patient may benefit from cont'd mental health wellness ck-ins.  Plan: Follow up with behavioral health clinician on : 2-3 wks for 30 min ck-ins Behavioral recommendations: Enjoy your Family over the Holidays Referral(s): [place] (In Clinic)  I discussed the assessment and treatment plan with the patient and/or parent/guardian. They were provided an opportunity to ask questions and all were answered. They agreed with the plan and demonstrated an understanding of the instructions.   They were advised to call back or seek an in-person evaluation if the symptoms worsen or if the condition fails to improve as anticipated.  Donnetta Hutching, LMFT

## 2021-01-31 NOTE — Telephone Encounter (Signed)
Spoke with [name] at [employer]. Reviewed Sig and clarified that 60 tabs is a 90 day supply. She will get this ready for patient today.

## 2021-02-04 ENCOUNTER — Telehealth: Payer: Self-pay | Admitting: Pharmacist

## 2021-02-04 NOTE — Telephone Encounter (Signed)
Patient conveyed her patient self testing point of care fingerstick INR value as 1.6 today. Goal is 1.5 - 2.5. She was instructed to increase dose to 1 of her 2.62m strength warfarin tablets on Sunday, Tuesday, Wednesday, Thursday and Saturday. She was instructed to take only 1/2 x 2.523m(1.25102mon Friday. She takes NO warfarin on Mondays. She will repeat PST FS POC INR on Monday 2-JAN-23 and report value to me. No bleeding or other complications endorsed by the patient during this phone encounter.

## 2021-02-05 MED ORDER — WARFARIN SODIUM 2.5 MG PO TABS
ORAL_TABLET | ORAL | 3 refills | Status: DC
  Filled 2021-04-25: qty 90, 90d supply, fill #0
  Filled 2021-07-25: qty 90, 90d supply, fill #1
  Filled 2021-09-07 – 2021-10-22 (×2): qty 90, 90d supply, fill #2
  Filled 2021-12-22 – 2022-01-24 (×2): qty 90, 90d supply, fill #3

## 2021-02-05 NOTE — Addendum Note (Signed)
Addended by: Dorian Pod A on: 02/05/2021 03:31 PM   Modules accepted: Orders

## 2021-02-12 ENCOUNTER — Telehealth: Payer: Self-pay | Admitting: Pharmacist

## 2021-02-12 NOTE — Telephone Encounter (Signed)
Patient shared results of her patient self testing, finger-stick, point of care INR result = 2.2 (on 13.8m warfarin per week.) As:    Su-2.571mM-91m63m-2.5mg59m2.5mg 891m2.5mg F65m25mg S16m5mg  Us45m her green 2.5mg stre19mh warfarin tablets. No bleeding reported. Continue SAME regimen. Next INR on Monday 9-JAN-23.

## 2021-02-13 ENCOUNTER — Ambulatory Visit: Payer: HMO | Admitting: Podiatry

## 2021-02-13 ENCOUNTER — Other Ambulatory Visit: Payer: Self-pay

## 2021-02-13 DIAGNOSIS — M79675 Pain in left toe(s): Secondary | ICD-10-CM

## 2021-02-13 DIAGNOSIS — M79674 Pain in right toe(s): Secondary | ICD-10-CM | POA: Diagnosis not present

## 2021-02-13 DIAGNOSIS — I739 Peripheral vascular disease, unspecified: Secondary | ICD-10-CM

## 2021-02-13 DIAGNOSIS — B351 Tinea unguium: Secondary | ICD-10-CM

## 2021-02-13 NOTE — Progress Notes (Signed)
SUBJECTIVE Patient presents to office today complaining of elongated, thickened nails that cause pain while ambulating in shoes.  She is unable to trim her own nails.  Patient is on Eliquis anticoagulant for peripheral vascular disease being managed by Dr. Quay Burow .  Patient states that she does have blockages in her bilateral lower extremities.  Patient is here for further evaluation and treatment.  Past Medical History:  Diagnosis Date   Abnormality of lung on CXR 02/02/2020   Nonspecific finding on CXR ordered by pulmonologist - c/w inflammation vs infection, f/u imaging suggested, Dr. [name] office has been in communication about recommended next steps.   Anxiety 07/24/2010   Aortic atherosclerosis ([place]) 10/19/2014   Seen on CT scan, currently asymptomatic   Arteriovenous malformation of gastrointestinal tract 08/08/2015   Non-bleeding when visualized on capsule endoscopy 06/30/2015    Arthritis    "lower back; hands" (02/19/2018)   Asthma    Asymptomatic cholelithiasis 09/25/2015   Seen on CT scan 08/2015   Carotid artery stenosis; s/p R endarterectomy    s/p right endarterectomy (06/2010) Carotid US (07/2010):  Left: Moderate-to-severe (60-79%) calcific and non-calcific plaque origin and proximal ICA and ECA    Chronic congestive heart failure with left ventricular diastolic dysfunction (HCC) 10/21/2010   Chronic constipation 02/03/2011   Chronic daily headache 01/16/2014   Chronic iron deficiency anemia    Chronic low back pain 10/06/2012   Chronic venous insufficiency 08/04/2012   COPD (chronic obstructive pulmonary disease) with emphysema (HCC)    PFTs 2018: severe obstructive disease, insignif response to bronchiodilator, mild restriction parenchymal pattern, moderately severe diffusion defect. 2014  FEV1 0.92 (40%), ratio 69, 27% increase in FEV1 with BD, TLC 91%, severe airtrapping, DLCO49% On chronic home O2. Pulmonary rehab referral 05/2012    Dyspnea    Fibromyalgia  08/29/2010   Gastroesophageal reflux disease    History of blood transfusion    "several times"  (02/19/2018)   History of clear cell renal cell carcinoma ([place]), in remission 07/21/2011   s/p cryoablation of left RCC in 09/2011 by [name]. Followed by [name]  [facility]) .     History of hiatal hernia    History of mitral valve replacement with bioprosthetic valve due to mitral stenosis 2012   s/p MVR with a 27-mm pericardial porcine valve (Medtronic Mosaic valve, serial #[id number] on 09/20/10, Dr. Prescott Gum)    History of obstructive sleep apnea, resolved 2013   resolved per sleep study 07/2019; no apnea, but did have desaturation.  CPAP no longer necessary.  Nocturnal polysomnography (06/2009): Moderate sleep apnea/ hypopnea syndrome , AHI 17.8 per hour with nonpositional hypopneas. CPAP titration to 12 CWP, AHI 2.4 per hour. On nocturnal CPAP via a small resMed Quattro full-face mask with heated humidifier.    History of pneumonia    "once"  (02/19/2018)   History of seborrheic keratosis 09/28/2015   Hyperlipidemia LDL goal < 100 11/20/2005   Internal and external hemorrhoids without complication 08/03/7626   Lesion of left native kidney 06/01/2020   Incidental finding on recent screening chest CT for lung ca ordered by Dr. Annamaria Boots pulmonology - he has ordered a f/u renal U/S.    Lichen sclerosus of female genitalia 01/12/2017   Migraine    "none in years" (02/19/2018)   Moderately severe major depression ([place]) 11/19/2005   Nocturnal hypoxia per sleep study 07/2019    Osteoporosis    DEXA 2016: T -2.7; DEXA (12/09/2011): L-spine T -3.7, left hip  T -1.4 DEXA (12/2004): L-spine T -2.6, left hip -0.1    Paroxysmal atrial fibrillation ([place]) 10/22/2010   s/p Left atrial maze procedure for paroxysmal atrial fibrillation on 09/20/2010 by Dr Prescott Gum.  Subsequent splenic infarct, decision was made to re-anticoagulate with coumadin, likely life-long as this is the most likely cause of  the splenic infarct.    Personal history of colonic polyps 05/14/2011   Colonoscopy (05/2011): 4 mm adenomatous polyp excised endoscopically Colonoscopy (02/2002): Adenomatous polyp excised endoscopically    Personal history of renal cell carcinoma 09/12/2020   Pneumonia    Pulmonary hypertension due to chronic obstructive pulmonary disease ([place]) 04/25/2016   2014 TEE w PA peak pressure 46 mmHg, s/p MV replacement    Right nephrolithiasis, asymptomatic, incidental finding 09/06/2014   5 mm non-obstructing calculus seen on CT scan 09/05/2014    Right ventricular failure ([place]) 04/25/2016   Severe obesity (BMI 35.0-39.9) with comorbidity ([place]) 10/23/2011   Sleep apnea    Tobacco abuse 07/28/2012   Type 2 diabetes mellitus with diabetic neuropathy ([place])     OBJECTIVE General Patient is awake, alert, and oriented x 3 and in no acute distress. Derm Skin is dry and supple bilateral. Negative open lesions or macerations. Remaining integument unremarkable. Nails are tender, long, thickened and dystrophic with subungual debris, consistent with onychomycosis, 1-5 bilateral. No signs of infection noted.  Hyperkeratotic preulcerative callus lesions also noted to the tips of the toes bilateral.  There are also symptomatic hyperkeratotic calluses noted to the subfirst MTPJ of the bilateral feet. Vasc  DP and PT pedal pulses diminished bilaterally. Temperature gradient within normal limits.  Delayed capillary refill bilateral Neuro Epicritic and protective threshold sensation grossly intact bilaterally.  Musculoskeletal Exam No symptomatic pedal deformities noted bilateral. Muscular strength within normal limits.  ASSESSMENT 1. Onychodystrophic nails 1-5 bilateral with hyperkeratosis of nails.  2. Onychomycosis of nail due to dermatophyte bilateral 3. Pain in foot bilateral 4.  Preulcerative callus lesions bilateral feet 5.  Peripheral vascular disease  PLAN OF CARE 1. Patient evaluated today.  2.  Instructed to maintain good pedal hygiene and foot care.  3. Mechanical debridement of nails 1-5 bilaterally performed using a nail nipper. Filed with dremel without incident.  4.  Excisional debridement of the hyperkeratotic calluses was performed using a chisel blade without incident or bleeding  5.  Patient states that she has been wearing house slippers around the house and has not been going barefoot 6.  Hydrating foot lotion was provided for the patient to apply daily  7.  Return to clinic in 3 mos.    Edrick Kins, DPM [facility]  Dr. Edrick Kins, DPM    [address], [place] [address]                Office [phone]  Fax [phone]

## 2021-02-18 ENCOUNTER — Telehealth: Payer: Self-pay | Admitting: Pharmacist

## 2021-02-18 ENCOUNTER — Other Ambulatory Visit: Payer: Self-pay | Admitting: *Deleted

## 2021-02-18 DIAGNOSIS — M545 Low back pain, unspecified: Secondary | ICD-10-CM

## 2021-02-18 DIAGNOSIS — I480 Paroxysmal atrial fibrillation: Secondary | ICD-10-CM | POA: Diagnosis not present

## 2021-02-18 DIAGNOSIS — Z7901 Long term (current) use of anticoagulants: Secondary | ICD-10-CM | POA: Diagnosis not present

## 2021-02-18 DIAGNOSIS — G8929 Other chronic pain: Secondary | ICD-10-CM

## 2021-02-18 MED ORDER — LIDOCAINE 5 % EX PTCH
MEDICATED_PATCH | CUTANEOUS | 2 refills | Status: DC
  Filled 2021-04-25: qty 60, 30d supply, fill #0
  Filled 2021-07-25: qty 60, 30d supply, fill #1
  Filled 2021-08-20: qty 60, 30d supply, fill #2

## 2021-02-18 NOTE — Telephone Encounter (Signed)
Patient provided results of patient-self-testing, finger-stick, point of care INR results performed just now. Result = 2.0 (goal 1.5 - 2.5). No bleeding, no new medications. Continue same regimen.    Su-2.378m M-059mT-2.78m50m-2.78mg70m-2.78mg 84m.278mg 50m.78mg   42mt INR 16JAN23.

## 2021-02-18 NOTE — Telephone Encounter (Signed)
Call from pt - stated she has switched pharmacy.Stated she believes all her meds had been transferred but could not transfer lidocaine patches. Requesting lidocaine rx sent to [facility]. Thanks  She wants her doctor be aware she will see Dr Gwenlyn Found on Friday.

## 2021-02-20 ENCOUNTER — Ambulatory Visit: Payer: HMO | Admitting: Podiatry

## 2021-02-22 ENCOUNTER — Ambulatory Visit: Payer: HMO | Admitting: Cardiovascular Disease

## 2021-02-22 ENCOUNTER — Other Ambulatory Visit: Payer: Self-pay

## 2021-02-22 ENCOUNTER — Encounter: Payer: Self-pay | Admitting: Cardiovascular Disease

## 2021-02-22 DIAGNOSIS — I6523 Occlusion and stenosis of bilateral carotid arteries: Secondary | ICD-10-CM | POA: Diagnosis not present

## 2021-02-22 DIAGNOSIS — I739 Peripheral vascular disease, unspecified: Secondary | ICD-10-CM | POA: Diagnosis not present

## 2021-02-22 NOTE — Assessment & Plan Note (Signed)
History of PAD by Doppler study performed 05/11/2019.  Her right ABI was 0.85 and left was 1.03.  She did have a high-frequency signal in her proximal right SFA and moderate disease in her mid left SFA.  Her pain is fairly atypical.  She has pain in the morning when she wakes up.  She has difficulty getting out of bed.  She tells me she basically has pain all the time in her back and her legs.  I do not think that she would benefit from an endovascular procedure.

## 2021-02-22 NOTE — Patient Instructions (Signed)
Medication Instructions:  Your physician recommends that you continue on your current medications as directed. Please refer to the Current Medication list given to you today.  *If you need a refill on your cardiac medications before your next appointment, please call your pharmacy*   Testing/Procedures: Your physician has requested that you have a carotid duplex. This test is an ultrasound of the carotid arteries in your neck. It looks at blood flow through these arteries that supply the brain with blood. Allow one hour for this exam. There are no restrictions or special instructions. To be done in March 2023. This procedure is done at [facility].    Follow-Up: At [facility], you and your health needs are our priority.  As part of our continuing mission to provide you with exceptional heart care, we have created designated Provider Care Teams.  These Care Teams include your primary Cardiologist (physician) and Advanced Practice Providers (APPs -  Physician Assistants and Nurse Practitioners) who all work together to provide you with the care you need, when you need it.  We recommend signing up for the patient portal called "MyChart".  Sign up information is provided on this After Visit Summary.  MyChart is used to connect with patients for Virtual Visits (Telemedicine).  Patients are able to view lab/test results, encounter notes, upcoming appointments, etc.  Non-urgent messages can be sent to your provider as well.   To learn more about what you can do with MyChart, go to [email or web address].    Your next appointment:   We will see you on an as needed basis/  Provider:   Quay Burow, MD

## 2021-02-22 NOTE — Progress Notes (Signed)
02/22/2021 [name]   23-Oct-1950  [id number]  Primary Physician Angelica Pou, MD Primary Cardiologist: Lorretta Harp MD Lupe Carney, [place]  HPI:  [name] is a 71 y.o.  moderately overweight divorced Caucasian female mother of 2 children, grandmother 1 grandchildren referred to me by Dr. Lynnae January for peripheral vascular valuation because of poorly palpable pedal pulses.  I last saw her in the office 04/26/2019.  She has a history of 50 pack years of tobacco abuse.  She has treated diabetes, hypertension and hyperlipidemia.  She had mitral valve replacement in 2002 with a bioprosthetic valve.  I performed cardiac catheterization on her 04/21/2016 in the setting of non-STEMI revealing nonobstructive CAD.  Patient denies chest pain or shortness of breath.  She is minimally ambulatory and basically walks around her house.  She has leg pain mostly in the morning when she is in bed but really denies claudication.  There is no evidence of critical limb ischemia.  Since I saw her 2 years ago she continues to complain of pain in her legs in the morning when she is in bed which makes it difficult for her to get out of bed.  She also complains of pain in her legs most of the time as well as pain in her back.  Her Dopplers suggest right greater than left lower extremity SFA disease but I do not think that these are contributing to her symptoms.  She denies chest pain or shortness of breath.  She did have an echo performed 02/06/2020 revealing normal LV systolic function with a well-functioning mitral prosthesis.     Current Meds  Medication Sig   albuterol (PROAIR HFA) 108 (90 Base) MCG/ACT inhaler Inhale 2 puffs into the lungs every 6 (six) hours as needed for shortness of breath.   ALPRAZolam (XANAX) 1 MG tablet TAKE 1 TABLET BY MOUTH EVERY NIGHT AT BEDTIME AS NEEDED FOR INSOMNIA. MAY TAKE 1/2 TABLET BY MOUTH DURING THE DAY FOR ANXIETY   buPROPion (WELLBUTRIN XL)  150 MG 24 hr tablet Take 1 tablet (150 mg total) by mouth every morning for 365 doses.   Continuous Blood Gluc Sensor (FREESTYLE LIBRE 2 SENSOR) MISC 1 each by Does not apply route 6 (six) times daily. To read 6 times daily with change of sensor every 2 weeks, 90 days worth with 3 refills   Continuous Blood Gluc Sensor ([place]) MISC Apply one sensor to the back of upper arm   DULoxetine (CYMBALTA) 60 MG capsule Take 1 capsule (60 mg total) by mouth 2 (two) times daily.   fluticasone (FLONASE) 50 MCG/ACT nasal spray Place 2 sprays into both nostrils daily.   Fluticasone-Umeclidin-Vilant (TRELEGY ELLIPTA) 100-62.5-25 MCG/ACT AEPB Inhale 1 puff into the lungs daily.   furosemide (LASIX) 80 MG tablet Take 1 tablet (80 mg total) by mouth every morning AND 0.5 tablets (40 mg total) daily in the afternoon.   gabapentin (NEURONTIN) 300 MG capsule Take 2 capsules (600 mg total) by mouth 3 (three) times daily.   HYDROcodone-acetaminophen (NORCO/VICODIN) 5-325 MG tablet Take 2 tablets by mouth 3 (three) times daily.   Insulin Degludec-Liraglutide (XULTOPHY) 100-3.6 UNIT-MG/ML SOPN Inject 40 Units into the skin daily for 30 doses.   Insulin Pen Needle 32G X 4 MM MISC Use to inject insulin up to 6 times a day   ipratropium-albuterol (DUONEB) 0.5-2.5 (3) MG/3ML SOLN Take 3 mLs by nebulization every 6 (six) hours as needed (shortness of breath,  wheezing).   lidocaine (LIDODERM) 5 % Place 1 or 2 patches to painful area of back each day.  Remove & Discard patch within 12 hours or as directed by MD   metoprolol succinate (TOPROL-XL) 50 MG 24 hr tablet Take 1 tablet (50 mg total) by mouth daily. Take with or immediately following a meal.   nystatin powder Apply 1 application topically 3 (three) times daily as needed.   omeprazole (PRILOSEC) 40 MG capsule Take 1 capsule (40 mg total) by mouth in the morning and at bedtime.   ondansetron (ZOFRAN) 4 MG tablet Take 1 tablet (4 mg total) by mouth  every 8 (eight) hours as needed for nausea or vomiting.   OXYGEN Inhale 2 L into the lungs at bedtime.   potassium chloride SA (KLOR-CON) 20 MEQ tablet Take 2 tablets (40 mEq total) by mouth every morning AND 1 tablet (20 mEq total) daily in the afternoon.   PRESCRIPTION MEDICATION Inhale into the lungs at bedtime. CPAP   Probiotic Product (ALIGN) 4 MG CAPS See admin instructions.   rosuvastatin (CRESTOR) 20 MG tablet TAKE 1 TABLET BY MOUTH ONCE EVERY NIGHT AT BEDTIME   warfarin (COUMADIN) 2.5 MG tablet Take 1 tablet daily unless otherwise instructed.   Wheat Dextrin (BENEFIBER) POWD See admin instructions.     Allergies  Allergen Reactions   Dexlansoprazole Other (See Comments)    Unknown reaction   Lorazepam Other (See Comments)    Patient's sister noted that ativan caused the patient to become extremely confused during hospitalization 09/2010; tolerates Xanax   Oxycontin [Oxycodone] Other (See Comments)    headache   Tramadol Hcl Swelling    Ankle swelling    Social History   Socioeconomic History   Marital status: Divorced    Spouse name: Not on file   Number of children: Not on file   Years of education: Not on file   Highest education level: Not on file  Occupational History   Not on file  Tobacco Use   Smoking status: Every Day    Packs/day: 1.00    Years: 55.00    Pack years: 55.00    Types: Cigarettes    Start date: 11/01/1962   Smokeless tobacco: Never  Vaping Use   Vaping Use: Never used  Substance and Sexual Activity   Alcohol use: Not Currently    Comment: 02/19/2018 "nothing since 1999"   Drug use: Not Currently    Types: Cocaine, Marijuana    Comment: 02/19/2018 "nothing since the 1990s"   Sexual activity: Not Currently    Birth control/protection: Post-menopausal  Other Topics Concern   Not on file  Social History Narrative   Lives alone in [place] ([place])   Worked at [employer] for 18 years   No car   Social Determinants of  Radio broadcast assistant Strain: Not on [employer] Insecurity: Not on file  Transportation Needs: Not on file  Physical Activity: Not on file  Stress: Not on file  Social Connections: Not on file  Intimate Partner Violence: Not on file     Review of Systems: General: negative for chills, fever, night sweats or weight changes.  Cardiovascular: negative for chest pain, dyspnea on exertion, edema, orthopnea, palpitations, paroxysmal nocturnal dyspnea or shortness of breath Dermatological: negative for rash Respiratory: negative for cough or wheezing Urologic: negative for hematuria Abdominal: negative for nausea, vomiting, diarrhea, bright red blood per rectum, melena, or hematemesis Neurologic: negative for visual changes, syncope, or  dizziness All other systems reviewed and are otherwise negative except as noted above.    Blood pressure (!) 142/54, pulse 69, height _0  (1.575 m), weight 163 lb 12.8 oz (74.3 kg), SpO2 93 %.  General appearance: alert and no distress Neck: no adenopathy, no carotid bruit, no JVD, supple, symmetrical, trachea midline, thyroid not enlarged, symmetric, no tenderness/mass/nodules, and soft bilateral carotid bruits Lungs: clear to auscultation bilaterally Heart: regular rate and rhythm, S1, S2 normal, no murmur, click, rub or gallop Extremities: extremities normal, atraumatic, no cyanosis or edema Pulses: 2+ and symmetric Skin: Skin color, texture, turgor normal. No rashes or lesions Neurologic: Grossly normal  EKG sinus rhythm at 69 without ST or T wave changes.  Personally reviewed this EKG.  ASSESSMENT AND PLAN:   Carotid artery stenosis; s/p R endarterectomy History of right carotid endarterectomy back in 2012 with recent Doppler studies performed 05/10/2020 revealing moderate bilateral ICA stenosis.  This will be repeated in 12 months.  Peripheral arterial disease ([place]) History of PAD by Doppler study performed 05/11/2019.  Her right ABI was  0.85 and left was 1.03.  She did have a high-frequency signal in her proximal right SFA and moderate disease in her mid left SFA.  Her pain is fairly atypical.  She has pain in the morning when she wakes up.  She has difficulty getting out of bed.  She tells me she basically has pain all the time in her back and her legs.  I do not think that she would benefit from an endovascular procedure.     Lorretta Harp MD FACP,FACC,FAHA, [facility] 02/22/2021 12:52 PM

## 2021-02-22 NOTE — Assessment & Plan Note (Signed)
History of right carotid endarterectomy back in 2012 with recent Doppler studies performed 05/10/2020 revealing moderate bilateral ICA stenosis.  This will be repeated in 12 months.

## 2021-02-25 ENCOUNTER — Telehealth: Payer: Self-pay | Admitting: Pharmacist

## 2021-02-25 NOTE — Telephone Encounter (Signed)
Patient reports patient self testing, finger-stick, point of care INR results today of 2.0 (target 1.5 - 2.5) on Su-2.17m M-071mT-2.16m56m-2.16mg43m-2.16mg 59m.216mg 30m.16mg . 716mll CONTINUE this regimen. Repeat INR 23JAN23.No bleeding reported. No new medications, no missed doses, no extra doses. No complaints.

## 2021-02-27 ENCOUNTER — Other Ambulatory Visit: Payer: Self-pay

## 2021-02-27 ENCOUNTER — Other Ambulatory Visit: Payer: Self-pay | Admitting: Internal Medicine

## 2021-02-27 DIAGNOSIS — M5442 Lumbago with sciatica, left side: Secondary | ICD-10-CM

## 2021-02-27 DIAGNOSIS — G8929 Other chronic pain: Secondary | ICD-10-CM

## 2021-02-27 MED ORDER — HYDROCODONE-ACETAMINOPHEN 5-325 MG PO TABS: 2.0000 | ORAL_TABLET | Freq: Three times a day (TID) | ORAL | 0 refills | Status: DC

## 2021-02-27 NOTE — Telephone Encounter (Signed)
HYDROcodone-acetaminophen (NORCO/VICODIN) 5-325 MG tablet, REFILL REQUEST @ [facility].

## 2021-02-27 NOTE — Telephone Encounter (Signed)
Last visit 12/27/2020.  Last Urine ToxAssure 12/16/2018.  Next visit scheduled for 05/02/2021.

## 2021-02-27 NOTE — Progress Notes (Signed)
refill 

## 2021-02-28 ENCOUNTER — Other Ambulatory Visit: Payer: Self-pay | Admitting: *Deleted

## 2021-02-28 ENCOUNTER — Other Ambulatory Visit: Payer: Self-pay | Admitting: Internal Medicine

## 2021-02-28 DIAGNOSIS — G8929 Other chronic pain: Secondary | ICD-10-CM

## 2021-02-28 MED ORDER — HYDROCODONE-ACETAMINOPHEN 5-325 MG PO TABS: 2.0000 | ORAL_TABLET | Freq: Three times a day (TID) | ORAL | 0 refills | Status: DC

## 2021-02-28 NOTE — Telephone Encounter (Signed)
Call from pt who stated Hydrocodone refill was sent to [facility] mail order not to [facility] as she requested. Stated she has 2 days of med left.    I called [facility] pharmacy to cancel Hydrocodone rx sent 02/27/21. Brandon,pharmacist, stated it had already been deactivated b/c pt has a new insurance plan starting at the beginning of the year; and [place] is not under her plan.   I called pt back to inform her of the above. Stated she had called her insurance co and was told Arloa Koh is her designated Psychologist, clinical,

## 2021-03-01 NOTE — Telephone Encounter (Signed)
Pt called / informed of refill.Very appreciative.

## 2021-03-04 ENCOUNTER — Ambulatory Visit: Payer: HMO | Admitting: Behavioral Health

## 2021-03-04 ENCOUNTER — Telehealth: Payer: Self-pay | Admitting: Pharmacist

## 2021-03-04 ENCOUNTER — Telehealth: Payer: Self-pay | Admitting: Dietician

## 2021-03-04 DIAGNOSIS — F419 Anxiety disorder, unspecified: Secondary | ICD-10-CM

## 2021-03-04 DIAGNOSIS — F331 Major depressive disorder, recurrent, moderate: Secondary | ICD-10-CM

## 2021-03-04 NOTE — BH Specialist Note (Signed)
Integrated Behavioral Health via Telemedicine Visit  03/04/2021 [name] [id number]  Number of Integrated Behavioral Health visits: 15 Session Start time: 11:00am  Session End time: 11:30am Total time: 30  Referring Provider: Dr. Lenise Herald, MD Patient/Family location: Pt is home in private [facility] Provider location: [facility] Office All persons participating in visit: Pt & Clinician  Types of Service: Individual psychotherapy  I connected with [name] and/or [name]'s  self  via  Telephone or Video Enabled Telemedicine Application  (Video is Caregility application) and verified that I am speaking with the correct person using two identifiers. Discussed confidentiality:  15th visit  I discussed the limitations of telemedicine and the availability of in person appointments.  Discussed there is a possibility of technology failure and discussed alternative modes of communication if that failure occurs.  I discussed that engaging in this telemedicine visit, they consent to the provision of behavioral healthcare and the services will be billed under their insurance.  Patient and/or legal guardian expressed understanding and consented to Telemedicine visit:  15th visit  Presenting Concerns: Patient and/or family reports the following symptoms/concerns: elevated anx/dep due to her suspicion she might be COVID-19 positive due to exposure @ [place] Duration of problem: weeks; Severity of problem: moderate  Patient and/or Family's Strengths/Protective Factors: Social connections, Social and Emotional competence, and [place] supports in place (healthy food, safe environments, etc.)  Goals Addressed: Patient will:  Reduce symptoms of: anxiety, depression, and stress   Increase knowledge and/or ability of: coping skills and stress reduction   Demonstrate ability to: Increase healthy adjustment to current life circumstances and Increase adequate support  systems for patient/family  Progress towards Goals: Ongoing  Interventions: Interventions utilized:  Supportive Counseling Standardized Assessments completed:  screeners prn  Patient and/or Family Response: Pt receptive to call & requests future calls for mental health wellness  Assessment: Patient currently experiencing elevated anx/dep & "numbness".   Patient may benefit from cont'd ck-in calls for mental health wellness & hardiness.  Plan: Follow up with behavioral health clinician on : 2-3 wks on telehealth for 30 min Behavioral recommendations: None today Referral(s): [place] (In Clinic)  I discussed the assessment and treatment plan with the patient and/or parent/guardian. They were provided an opportunity to ask questions and all were answered. They agreed with the plan and demonstrated an understanding of the instructions.   They were advised to call back or seek an in-person evaluation if the symptoms worsen or if the condition fails to improve as anticipated.  Donnetta Hutching, LMFT

## 2021-03-04 NOTE — Telephone Encounter (Signed)
Patient reports result of patient self-testing, finger-stick, point of care INR result performed this morning, INR 1.3 but with a missed dose last Tuesday she states. Discussed with the patient the importance of adherence and provided tips on how to remember/document dose has been taken, etc. Will use same dosing instructions, I.e.  Su-2.41m M-048mT-2.5m48m-2.5mg51m-2.5mg 14m.25mg 5m.5mg  P5ment has 2.5mg str34mth warfarin tablets. Repeat PST FS POC INR 30-JAN-23.

## 2021-03-05 DIAGNOSIS — Z961 Presence of intraocular lens: Secondary | ICD-10-CM | POA: Diagnosis not present

## 2021-03-05 DIAGNOSIS — H53022 Refractive amblyopia, left eye: Secondary | ICD-10-CM | POA: Diagnosis not present

## 2021-03-05 DIAGNOSIS — H05243 Constant exophthalmos, bilateral: Secondary | ICD-10-CM | POA: Diagnosis not present

## 2021-03-05 DIAGNOSIS — E119 Type 2 diabetes mellitus without complications: Secondary | ICD-10-CM | POA: Diagnosis not present

## 2021-03-05 DIAGNOSIS — H04123 Dry eye syndrome of bilateral lacrimal glands: Secondary | ICD-10-CM | POA: Diagnosis not present

## 2021-03-05 DIAGNOSIS — H43813 Vitreous degeneration, bilateral: Secondary | ICD-10-CM | POA: Diagnosis not present

## 2021-03-05 LAB — HM DIABETES EYE EXAM

## 2021-03-05 NOTE — Telephone Encounter (Signed)
Returned call patient states "Freestyle libre 2 reader is acting crazy." Suggested she call the [employer] support line and they often overnight a new one. She verbalized understanding.

## 2021-03-06 ENCOUNTER — Other Ambulatory Visit: Payer: Self-pay | Admitting: Dietician

## 2021-03-06 DIAGNOSIS — Z794 Long term (current) use of insulin: Secondary | ICD-10-CM

## 2021-03-06 DIAGNOSIS — E1140 Type 2 diabetes mellitus with diabetic neuropathy, unspecified: Secondary | ICD-10-CM

## 2021-03-06 MED ORDER — FREESTYLE LIBRE 3 SENSOR MISC
1.0000 | Freq: Every day | 3 refills | Status: DC
  Filled 2021-04-25 – 2021-05-29 (×4): qty 6, 84d supply, fill #0
  Filled 2021-08-07: qty 6, 84d supply, fill #1
  Filled 2021-09-07 – 2021-11-11 (×2): qty 6, 84d supply, fill #2

## 2021-03-06 NOTE — Telephone Encounter (Signed)
Ms Sachs would like to upgrade to the [facility] 3 Continuous glucose monitoring Request order for same be sent to [employer].

## 2021-03-08 ENCOUNTER — Telehealth: Payer: Self-pay

## 2021-03-08 DIAGNOSIS — Z748 Other problems related to care provider dependency: Secondary | ICD-10-CM

## 2021-03-08 NOTE — Telephone Encounter (Signed)
Requesting to speak with a nurse about something, please call pt back.

## 2021-03-08 NOTE — Telephone Encounter (Signed)
Returned call to patient. She is requesting help with reapplying for SCAT now called [employer] [place] and [employer]. Will send message to [facility]

## 2021-03-09 NOTE — Progress Notes (Signed)
HPI F Smoker for evaluation of Nocturnal Hypoxemia courtesy of Dr [name] with original question of OSA, complicated by  Aortic  Atherosclerosis, AVM GI tract, CHF, Mitral Valve Replacement,  Cardiac Amyloid, PAD, Venous Insufficiency, HTN, Migraine, COPD, Pulmonary Hypertension, GERD, DM2, Hx Renal Carcinoma, Depression, Tobacco abuse, Obesity,  NPSG at [place] [facility] 01/18/03- AHI 8.6/ hr -Nocturnal polysomnography (06/2009): Moderate sleep apnea/ hypopnea syndrome , AHI 17.8 per hour with nonpositional hypopneas. CPAP titration to 12 CWP, AHI 2.4 per hour.Body weight 183 lb NPSG 07/22/19- AHI 0.0/ htr, desat to 83%, with mean 93% after adding O2 2L, body weight 170 lbs PFT 04/23/16- Mod severe obstr F/F 0.69, FEV1 1.05/ 48%, insig resp to BD, TLC 76%, DLCO 52%  ==================================================================   09/06/20- 70 yoF Smoker(1 ppd) for evaluation of Nocturnal Hypoxemia , COPD, Chronic Hypoxic Resp Failure, complicated by  Aortic  Atherosclerosis, AVM GI tract, CHF, Mitral Valve Replacement,  Cardiac Amyloid, PAD, Venous Insufficiency, Pulmonary Hypertension, GERD, DM2,  Renal Carcinoma, Depression, Tobacco abuse, Obesity, Anemia,  HTN, Migraine,  -Trelegy 100, Proair hfa, Neb Duoneb,  O2 2L -3 sleep- owns her concentrator and portable O2                Hosp 08/18/20- Iron def Anemia Scheduled for CT ablation 8/3- kidney CA recurrent Covid vax-4 Moderna Breathing well- better with anemia being actively treated.  Discussed dyspnea related to anemia. Sleeps comfortably with O2. O2 sat on room air today 95%. No acute issues to delay kidney ablation. Watch for respiratory depression during recovery . Still smoking againsst advice. CT chest low dose screen 05/22/20- IMPRESSION: 1. Lung-RADS 2, benign appearance or behavior. Continue annual screening with low-dose chest CT without contrast in 12 months. 2. Cholelithiasis. 3. 3.0 cm low-attenuation lesion off the  upper pole right kidney is difficult to definitively characterize without post-contrast imaging and has enlarged from 10 mm on 08/23/2015. Consider further evaluation with ultrasound, as clinically indicated. 4. Aortic atherosclerosis (ICD10-I70.0). Coronary artery calcification. 5.  Emphysema (ICD10-J43.9).  03/11/21- 33 yoF Smoker(1 ppd) for evaluation of Nocturnal Hypoxemia , COPD, Chronic Hypoxic Resp Failure, complicated by  Aortic  Atherosclerosis, AVM GI tract, CHF, Mitral Valve Replacement,  Cardiac Amyloid, PAD, Venous Insufficiency, Pulmonary Hypertension, GERD, DM2,  Renal Carcinoma, Depression, Tobacco abuse, Obesity, Anemia,  HTN, Migraine,  -Trelegy 100, Proair hfa, Neb Duoneb,  O2 2L -3 sleep- owns her concentrator and portable O2                Hosp 08/18/20- Iron def Anemia Scheduled for CT ablation 8/3- kidney CA recurrent Covid vax-4 Moderna Flu vax-had Continues to smoke a pack a day.  Accepts referral to the telephone smoking cessation pharmacy program. Still using old CPAP machine with supplemental oxygen.  Sleep study in 2021 showed AHI 0 so she did not qualify then to replace the machine but she would feel better if she can continue to use it.  Needs new study to see if we can demonstrate OSA.  Otherwise consider if she would qualify for BiPAP machine with diagnosis of chronic respiratory failure. Inhaled medications do seem to help some. She had cryoablation of recurrent renal cell cancer and follows with nephrology.  ROS-see HPI   + = positive Constitutional:    weight loss, night sweats, fevers, chills, fatigue, lassitude. HEENT:    headaches, difficulty swallowing, tooth/dental problems, sore throat,       sneezing, itching, ear ache, nasal congestion, post nasal drip, snoring CV:  chest pain, orthopnea, PND, swelling in lower extremities, anasarca,                                   dizziness, palpitations Resp:   +shortness of breath with exertion or at rest.                 productive cough,   non-productive cough, coughing up of blood.              change in color of mucus.  wheezing.   Skin:    rash or lesions. GI:  No-   heartburn, indigestion, abdominal pain, nausea, vomiting, diarrhea,                 change in bowel habits, loss of appetite GU: dysuria, change in color of urine, no urgency or frequency.   flank pain. MS:   joint pain, stiffness, decreased range of motion, back pain. Neuro-     nothing unusual Psych:  change in mood or affect.  depression or anxiety.   memory loss.  OBJ- Physical Exam      +wheelchair  General- Alert, Oriented, Affect-appropriate, Distress- none acute, + wheelchair,  room air Skin- rash-none, lesions- none, excoriation- none Lymphadenopathy- none Head- atraumatic            Eyes- Gross vision intact, PERRLA, conjunctivae and secretions clear            Ears- Hearing, canals-normal            Nose- Clear, no-Septal dev, mucus, polyps, erosion, perforation             Throat- Mallampati IV , mucosa clear , drainage- none, tonsils- atrophic, + edentulous Neck- flexible , trachea midline, no stridor , thyroid nl, carotid no bruit Chest - symmetrical excursion , unlabored           Heart/CV- RRR , no murmur , no gallop  , no rub, nl s1 s2                           - JVD- none , edema- none, stasis changes- none, varices- none           Lung- clear to P&A, wheeze- none, cough+ , dullness-none, rub- none           Chest wall-  Abd-  Br/ Gen/ Rectal- Not done, not indicated Extrem- cyanosis- none, clubbing, none, atrophy- none, strength- nl Neuro- grossly intact to observation

## 2021-03-11 ENCOUNTER — Other Ambulatory Visit: Payer: Self-pay

## 2021-03-11 ENCOUNTER — Ambulatory Visit: Payer: HMO | Admitting: Internal Medicine

## 2021-03-11 ENCOUNTER — Encounter: Payer: Self-pay | Admitting: Internal Medicine

## 2021-03-11 ENCOUNTER — Telehealth: Payer: Self-pay | Admitting: Behavioral Health

## 2021-03-11 ENCOUNTER — Telehealth: Payer: Self-pay | Admitting: Pharmacist

## 2021-03-11 VITALS — BP 138/62 | HR 59 | Temp 98.2°F | Ht 62.0 in | Wt 165.0 lb

## 2021-03-11 DIAGNOSIS — Z720 Tobacco use: Secondary | ICD-10-CM

## 2021-03-11 DIAGNOSIS — G4734 Idiopathic sleep related nonobstructive alveolar hypoventilation: Secondary | ICD-10-CM | POA: Diagnosis not present

## 2021-03-11 DIAGNOSIS — J9611 Chronic respiratory failure with hypoxia: Secondary | ICD-10-CM | POA: Diagnosis not present

## 2021-03-11 DIAGNOSIS — G4733 Obstructive sleep apnea (adult) (pediatric): Secondary | ICD-10-CM

## 2021-03-11 NOTE — Telephone Encounter (Signed)
Patient provided results of finger-stick, point-of-care, self-testing INR for today = 1.8 (target goal 1.5 - 2.5). Will continue same regimen: Su-2.39m M-027mT-2.40m10m-2.40mg67m-2.40mg 70m.240mg 70m.40mg  U6mg her 2.40mg str27mth-green warfarin tablets. No bleeding reported by the patient.

## 2021-03-11 NOTE — Patient Instructions (Addendum)
Order- schedule split night sleep study   dx OSA, Chronic Respiratory Failure with Hypoxia  Order- refer to Pharmacy Smoking Cessation program  Try adding an otc antihistamine like claritin, zyrtec or allegra for a few days to see if that can dry up some of the wet throat feeling.

## 2021-03-11 NOTE — Telephone Encounter (Signed)
RC to Pt from last week's request.  Pt did not answer initially, but on second try Clinician reached Pt. Pt is drowsy & has multiple needs today.   Pt needs addt'l food support. Pt needs SCAT Re-Cert prior to her Str leaving on a cruise when she handles her transportation & Pt has multiple Provider appts she needs to cover.   Pt needs to use her Pulmonary Provider visit today to get information about her Sleep Study f/u.

## 2021-03-13 ENCOUNTER — Telehealth: Payer: Self-pay | Admitting: Dietician

## 2021-03-13 ENCOUNTER — Encounter: Payer: Self-pay | Admitting: Internal Medicine

## 2021-03-13 ENCOUNTER — Telehealth: Payer: Self-pay | Admitting: Behavioral Health

## 2021-03-13 NOTE — Assessment & Plan Note (Signed)
Needs new sleep study to clarify if appropriate diagnosis is chronic respiratory failure, and/or OSA.  This will affect whether she should appropriately be treated with positive airway pressure during sleep. Plan-scheduling sleep study.  Because of her complex medical problems this should be done as a split-night attended sleep study.

## 2021-03-13 NOTE — Assessment & Plan Note (Signed)
Counseling done.  She agrees to let us send her name to the pharmacy telephone-based smoking cessation program

## 2021-03-13 NOTE — Telephone Encounter (Signed)
Contacted Pt to f/u on concerns expressed on Monday, Jan 30th.  Instructed Pt to contact her Case Mngr w/[employer] to assist her w/Re-Cert of SCAT resources. Pt sts she only used this transport one time last year. Stressed to Pt she needs to have this resource on board so she has alternative transportation avail when her Str [name] is unable to assist. Pt agreed.   Pt agreed she will contact [employer] for transportation options also.   Pt clarified she only receives 23.00 from [place] has a COVID-19 Supplement that is due to run out in March according to one of her contacts. Encouraged Pt to ask her CW about this to assist w/Food Pantry & drop-off needs as they happen. Pt agreed.   Pt is more upbeat today & grateful for f/u call. Pt sts she is ready to tackle some of her needed calls to others, she just tends to "procrastinate".  Dr. Theodis Shove

## 2021-03-13 NOTE — Telephone Encounter (Signed)
Scheduled an appointment for tomorrow for diabetes.

## 2021-03-14 ENCOUNTER — Telehealth: Payer: Self-pay | Admitting: *Deleted

## 2021-03-14 ENCOUNTER — Telehealth: Payer: HMO | Admitting: Dietician

## 2021-03-14 NOTE — Chronic Care Management (AMB) (Signed)
Care Management   Note  03/14/2021 Name: [name] MRN: [id number] DOB: 1950-05-01  Evans Lance Urquilla is a 71 y.o. year old female who is a primary care patient of Angelica Pou, MD. I reached out to [name] by phone today in response to a referral sent by Ms. Evans Lance Morillo's primary care provider.   Ms. Umbaugh was given information about care management services today including:  Care management services include personalized support from designated clinical staff supervised by her physician, including individualized plan of care and coordination with other care providers 24/7 contact phone numbers for assistance for urgent and routine care needs. The patient may stop care management services at any time by phone call to the office staff.  Patient agreed to services and verbal consent obtained.   Follow up plan: Telephone appointment with care management team member scheduled for:03/22/21  [place] Management  Direct Dial: [phone]

## 2021-03-18 ENCOUNTER — Telehealth: Payer: Self-pay | Admitting: Pharmacist

## 2021-03-18 DIAGNOSIS — I480 Paroxysmal atrial fibrillation: Secondary | ICD-10-CM | POA: Diagnosis not present

## 2021-03-18 DIAGNOSIS — Z7901 Long term (current) use of anticoagulants: Secondary | ICD-10-CM | POA: Diagnosis not present

## 2021-03-18 NOTE — Telephone Encounter (Signed)
Patient reports patient self testing, finger-stick, point-of-care INR results for today = 1.8 (Target goal 1.5 - 2.5). Will CONTINUE SAME regimen  Su-2.52m M-017mT-2.5m10m-2.5mg16m-2.5mg 38m.25mg 30m.5mg . 49mbleeding reported. Will REPEAT PST FS POC INR Monday 13-FEB-23.

## 2021-03-19 ENCOUNTER — Telehealth: Payer: Self-pay

## 2021-03-19 NOTE — Telephone Encounter (Signed)
Telephone encounter was:  Unsuccessful.  03/19/2021 Name: [name] MRN: [id number] DOB: [birth year]  Unsuccessful outbound call made today to assist with:  Transportation Needs   Outreach Attempt:  1st Attempt  A HIPAA compliant voice message was left requesting a return call.  Instructed patient to call back at [phone].  Goble Fudala, AAS Paralegal, [facility] Care Guide  Embedded Care Coordination [place]   Care Management  [address] [place], [place] [address] ??[email or web address]   ?? [phone]   [email or web address]

## 2021-03-21 ENCOUNTER — Telehealth: Payer: Self-pay

## 2021-03-21 NOTE — Telephone Encounter (Signed)
Telephone encounter was:  Unsuccessful.  03/21/2021 Name: [name] MRN: [id number] DOB: [birth year]  Unsuccessful outbound call made today to assist with:  Transportation Needs   Outreach Attempt:  2nd Attempt  A HIPAA compliant voice message was left requesting a return call.  Instructed patient to call back at [phone].  Brisa Auth, AAS Paralegal, [facility] Care Guide  Embedded Care Coordination [place]   Care Management  [address] [place], [place] [address] ??[email or web address]   ?? [phone]   [email or web address]

## 2021-03-22 ENCOUNTER — Ambulatory Visit: Payer: HMO | Admitting: Licensed Clinical Social Worker

## 2021-03-22 ENCOUNTER — Telehealth: Payer: Self-pay

## 2021-03-22 NOTE — Chronic Care Management (AMB) (Signed)
Care Management   Social Work Visit Note  03/22/2021 Name: [name] MRN: [id number] DOB: 1950/12/24  [name] is a 71 y.o. year old female who sees [name], Kathryn Pattee, MD for primary care. The care management team was consulted for assistance with care management and care coordination needs related to [facility] Resources    Patient was given the following information about care management and care coordination services today, agreed to services, and gave verbal consent: 1.care management/care coordination services include personalized support from designated clinical staff supervised by their physician, including individualized plan of care and coordination with other care providers 2. 24/7 contact phone numbers for assistance for urgent and routine care needs. 3. The patient may stop care management/care coordination services at any time by phone call to the office staff.  Engaged with patient by telephone for initial visit in response to provider referral for social work chronic care management and care coordination services.  Assessment: Review of patient history, allergies, and health status during evaluation of patient need for care management/care coordination services.    Interventions:  Patient interviewed and appropriate assessments performed Collaborated with clinical team regarding patient needs  SDOH completed by SW. Patient denied needing assistance with; transportation or food. Patient receives hot meals via mobile meals. Patient owns her home and is in need of repairs. Patient stated she needs her front and back door painted. Patient has applied through senior resources for volunteers to assist with paint.  Patient stated is always in pain, but denied wanting to speak with pcp or RNCM.  SDOH (Social Determinants of Health) assessments performed: Yes     Plan:  SW will research volunteers who assist with housing repairs if patient buys  supplies. SW will follow up in 30 days.  [name], [place]  Social Worker [facility] Care Management  [phone]

## 2021-03-22 NOTE — Telephone Encounter (Signed)
Telephone encounter was:  Successful.  03/22/2021 Name: [name] MRN: [id number] DOB: [birth year]  [name] is a 71 y.o. year old female who is a primary care patient of Miguel Aschoff, MD . The community resource team was consulted for assistance with Transportation Needs   Care guide performed the following interventions: Spoke with patient to inform her a partially completed SCAT application would be mailed to her and I will contact her next week to confirm she has received it.  I also gave her information about Hospital doctor.  I asked her if she needed transportation to any upcoming appointments and she stated she did not.     Follow Up Plan:  Care guide will follow up with patient by phone over the next 7-10 days  Heydi Swango, AAS Paralegal, [facility] Care Guide  Embedded Care Coordination [facility]   Care Management  [address] [place], [place] [address] ??[email or web address]   ?? [phone]   [email or web address]

## 2021-03-22 NOTE — Patient Instructions (Signed)
Visit Information  Instructions: patient will work with SW to address concerns related to repairs of home.  Patient was given the following information about care management and care coordination services today, agreed to services, and gave verbal consent: 1.care management/care coordination services include personalized support from designated clinical staff supervised by their physician, including individualized plan of care and coordination with other care providers 2. 24/7 contact phone numbers for assistance for urgent and routine care needs. 3. The patient may stop care management/care coordination services at any time by phone call to the office staff.  Patient verbalizes understanding of instructions and care plan provided today and agrees to view in [place]. Active MyChart status confirmed with patient.    The care management team will reach out to the patient again over the next 30 days.   [name], [place]  Social Worker [facility] Care Management  [phone]

## 2021-03-25 ENCOUNTER — Telehealth: Payer: Self-pay | Admitting: Internal Medicine

## 2021-03-25 ENCOUNTER — Telehealth: Payer: Self-pay | Admitting: Pharmacist

## 2021-03-25 NOTE — Telephone Encounter (Signed)
Pt states she has been waiting to hear back about a transfusion. Pt requesting to know when and if it is still needed.

## 2021-03-25 NOTE — Telephone Encounter (Signed)
Patient reports INR value from patient self testing, point-of-care, finger-stick INR for today as 1.8 (goal 1.5 - 2.5). Advised to CONTINUE same regimen:  Su-2.58m M-016mT-2.46m72m-2.46mg446m-2.46mg 10m.246mg 1m.46mg . 82meat INR 20-FEB-23. No bleeding signs or symptoms endorsed.

## 2021-03-26 ENCOUNTER — Telehealth: Payer: Self-pay | Admitting: Dietician

## 2021-03-26 NOTE — Telephone Encounter (Signed)
Call her tomorrow if possible between 11-12 noon.

## 2021-03-27 ENCOUNTER — Other Ambulatory Visit: Payer: Self-pay | Admitting: Internal Medicine

## 2021-03-27 ENCOUNTER — Encounter: Payer: Self-pay | Admitting: Dietician

## 2021-03-28 ENCOUNTER — Telehealth: Payer: Self-pay

## 2021-03-28 DIAGNOSIS — I270 Primary pulmonary hypertension: Secondary | ICD-10-CM | POA: Diagnosis not present

## 2021-03-28 DIAGNOSIS — J449 Chronic obstructive pulmonary disease, unspecified: Secondary | ICD-10-CM | POA: Diagnosis not present

## 2021-03-28 DIAGNOSIS — J439 Emphysema, unspecified: Secondary | ICD-10-CM | POA: Diagnosis not present

## 2021-03-28 NOTE — Telephone Encounter (Signed)
[name] asks for review of her blood sugars because they are running higher. She scheduled an appointment for next week to discuss blood sugar data and glycemic control.

## 2021-03-28 NOTE — Telephone Encounter (Signed)
Telephone encounter was:  Unsuccessful.  03/28/2021 Name: [name] MRN: [id number] DOB: 13-Jul-1950  Unsuccessful outbound call made today to assist with:  Transportation Needs   Outreach Attempt:  2nd Attempt  A HIPAA compliant voice message was left requesting a return call.  Instructed patient to call back at [phone]. Left message on voicemail for patient to return my call regarding mailed application for SCAT transportation.   Estelle Greenleaf, AAS Paralegal, [facility] Care Guide  Embedded Care Coordination [place]   Care Management  [address] [place], [place] [address] ??[email or web address]   ?? [phone]   [email or web address]

## 2021-03-31 IMAGING — MG DIGITAL SCREENING BILAT W/ TOMO W/ CAD
8 series · 8 of 24 positions shown · non-contrast
Comparison: Previous exam(s).

CLINICAL DATA: Screening.

EXAM:
DIGITAL SCREENING BILATERAL MAMMOGRAM WITH TOMO AND CAD

[R CC synth-2D]
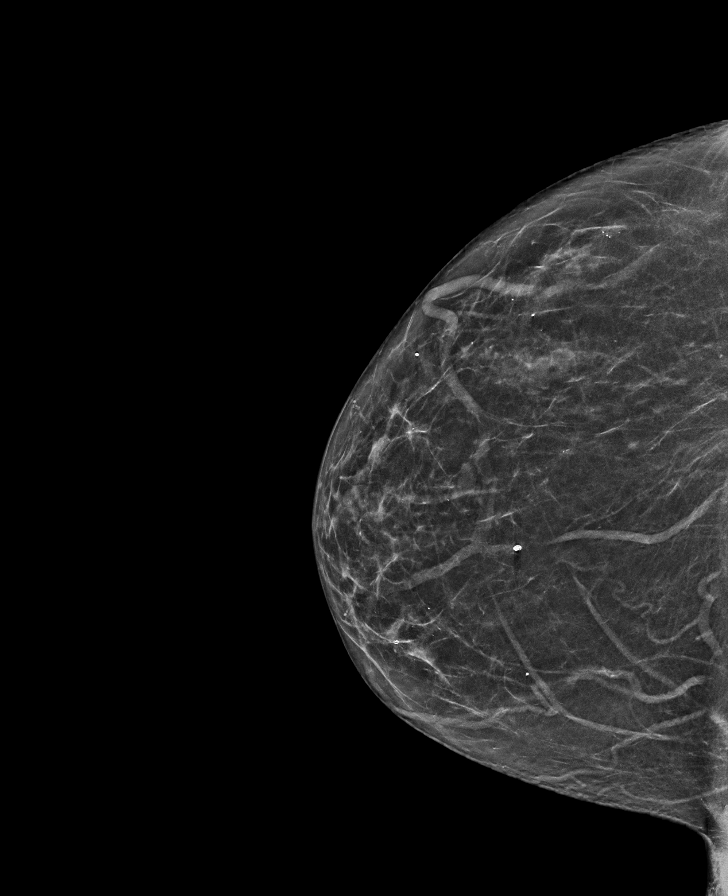

[L CC synth-2D]
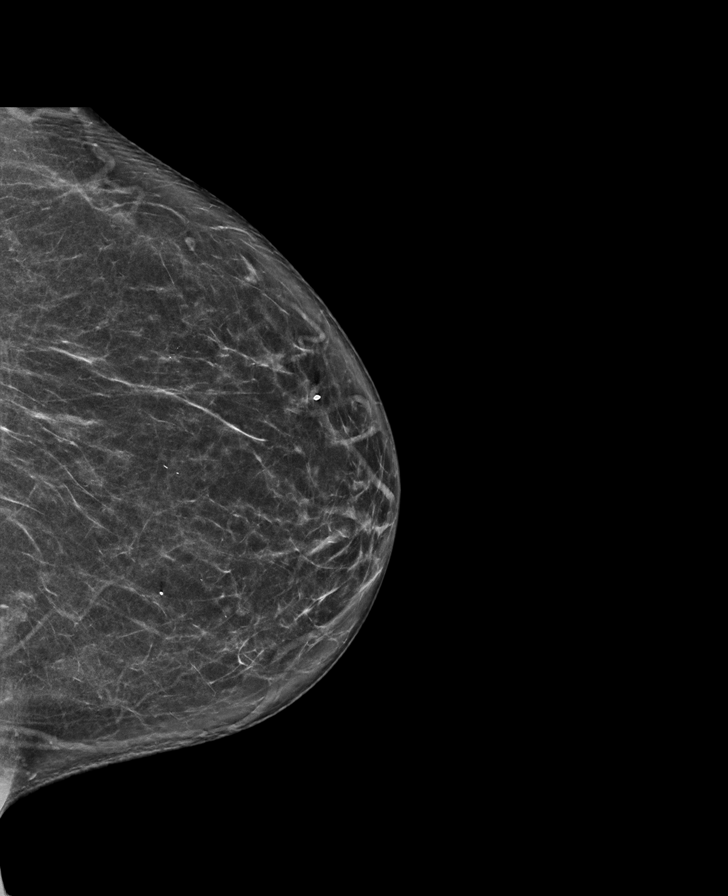

[R MLO synth-2D]
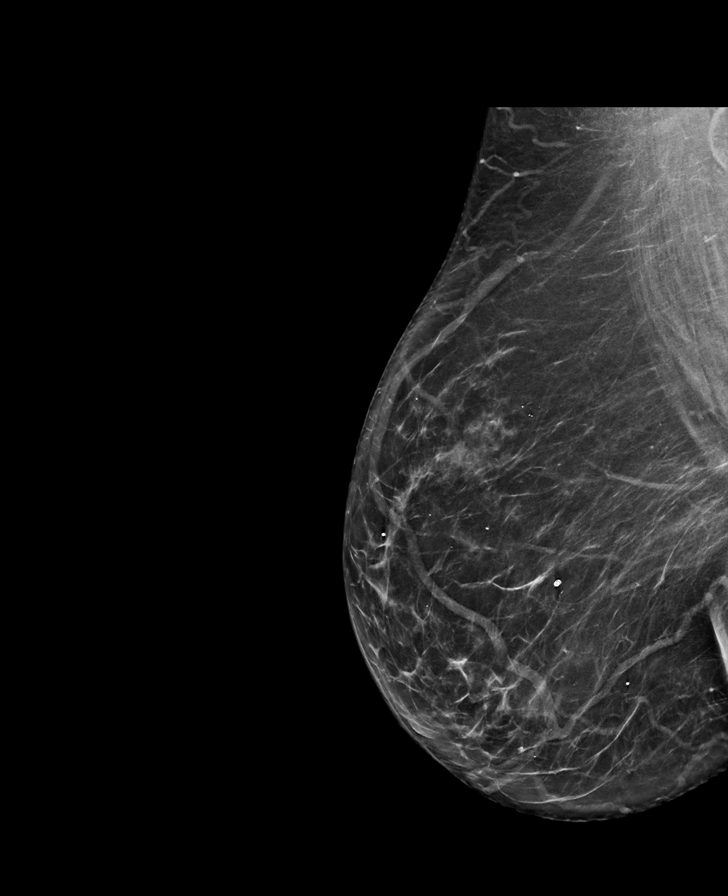

[L MLO synth-2D]
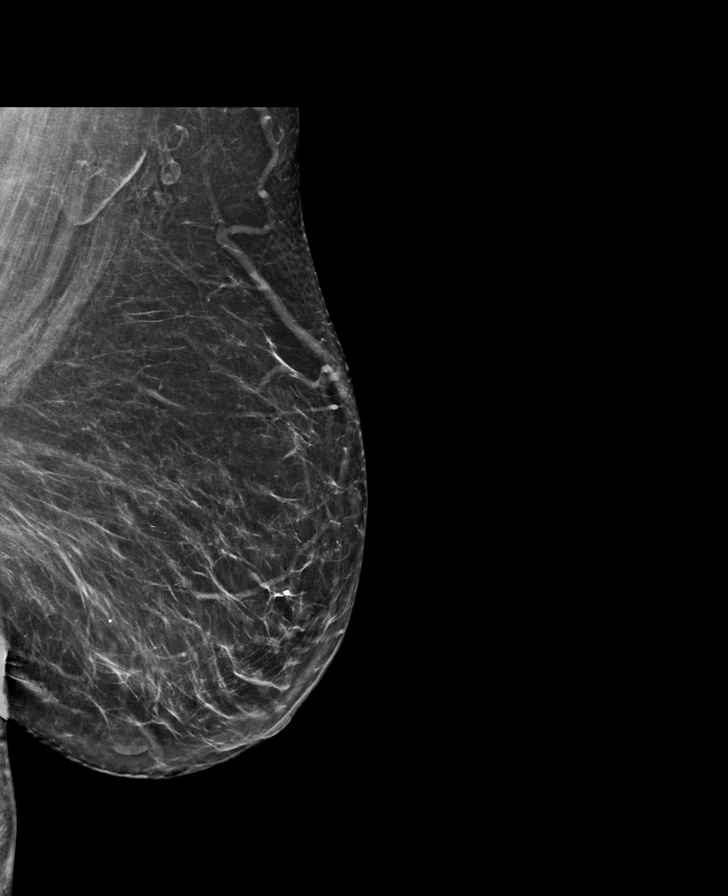

[L CC tomo · tomo slice 35/68.0]
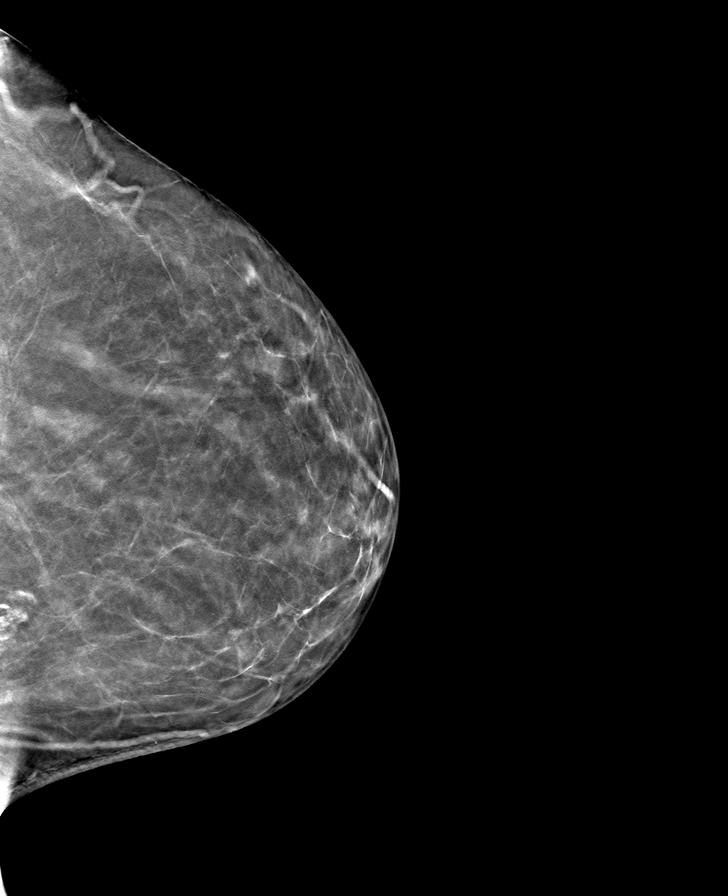

[R CC tomo · tomo slice 31/62.0]
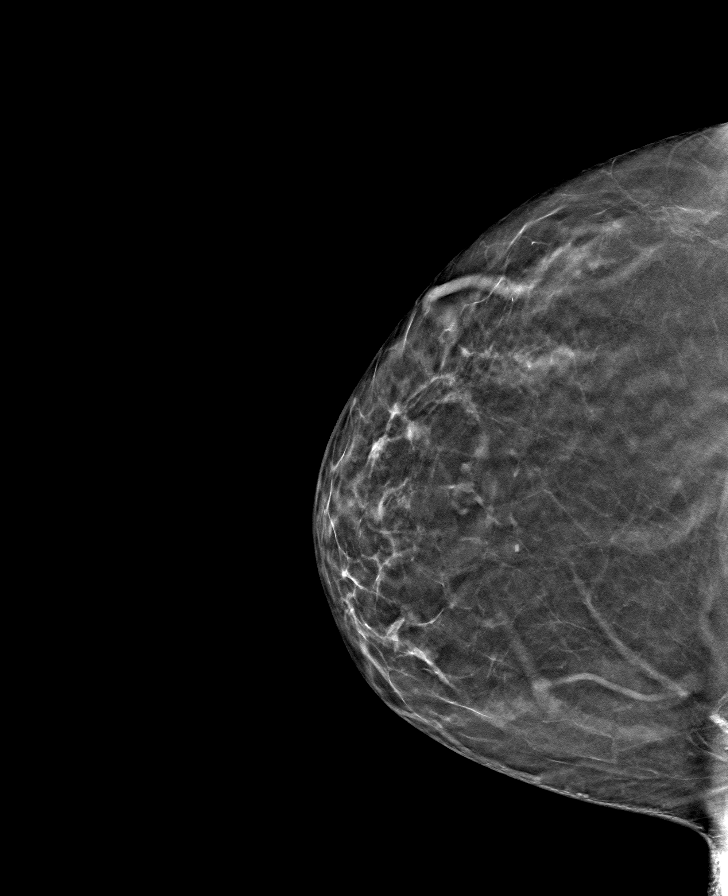

[R MLO tomo · tomo slice 37/73.0]
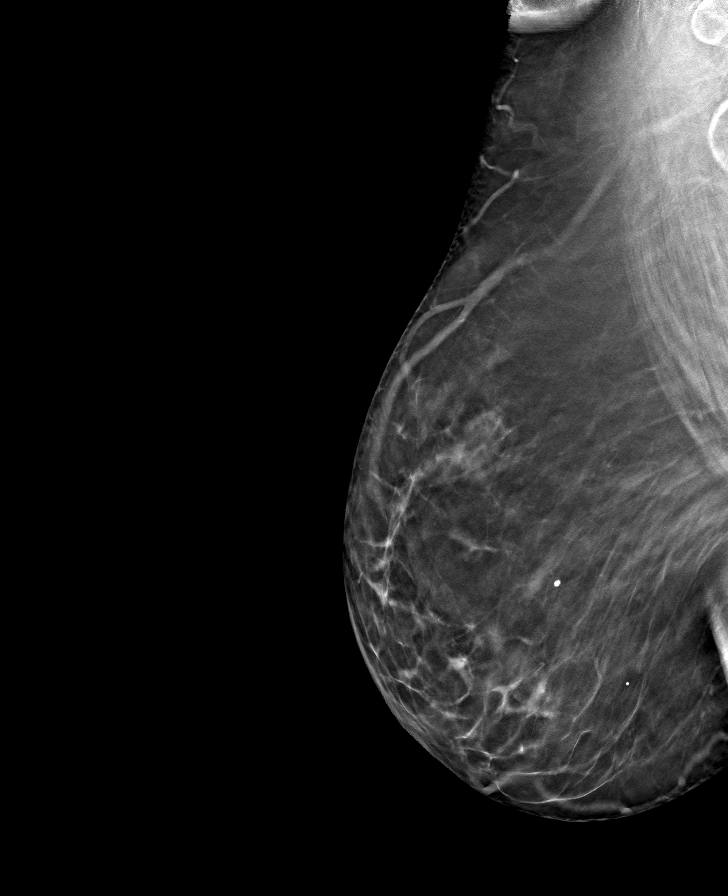

[L MLO tomo · tomo slice 41/80.0]
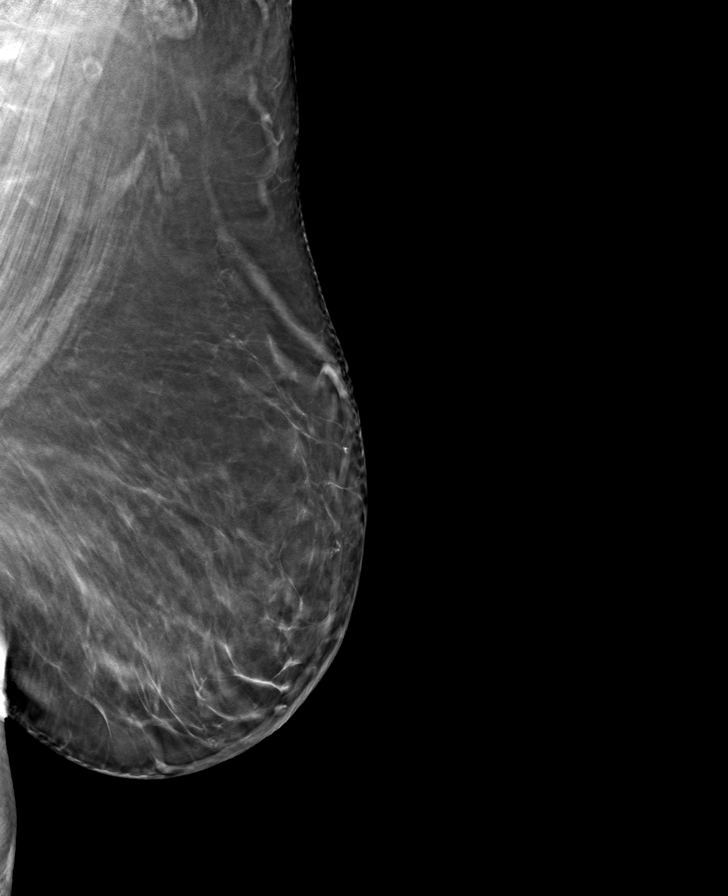

[8 of 24 positions shown; findings below may reference images not displayed]

ACR Breast Density Category b: There are scattered areas of
fibroglandular density.
FINDINGS: There are no findings suspicious for malignancy. Images were
processed with CAD.
IMPRESSION: No mammographic evidence of malignancy. A result letter of this
screening mammogram will be mailed directly to the patient.

RECOMMENDATION:
Screening mammogram in one year. (Code:[id number])

BI-RADS CATEGORY  1: Negative.

## 2021-04-01 ENCOUNTER — Telehealth: Payer: Self-pay

## 2021-04-01 ENCOUNTER — Telehealth: Payer: Self-pay | Admitting: Pharmacist

## 2021-04-01 ENCOUNTER — Other Ambulatory Visit: Payer: Self-pay | Admitting: Internal Medicine

## 2021-04-01 ENCOUNTER — Ambulatory Visit: Payer: HMO | Admitting: Behavioral Health

## 2021-04-01 DIAGNOSIS — J069 Acute upper respiratory infection, unspecified: Secondary | ICD-10-CM | POA: Diagnosis not present

## 2021-04-01 DIAGNOSIS — F331 Major depressive disorder, recurrent, moderate: Secondary | ICD-10-CM

## 2021-04-01 DIAGNOSIS — F419 Anxiety disorder, unspecified: Secondary | ICD-10-CM

## 2021-04-01 DIAGNOSIS — D509 Iron deficiency anemia, unspecified: Secondary | ICD-10-CM

## 2021-04-01 NOTE — Telephone Encounter (Signed)
Telephone encounter was:  Successful.  04/01/2021 Name: [name] MRN: [id number] DOB: 06-20-1950  [name] is a 71 y.o. year old female who is a primary care patient of Miguel Aschoff, MD . The community resource team was consulted for assistance with Transportation Needs   Care guide performed the following interventions: Spoke with patient she has received the SCAT transportation application and does not feel she will need assistance completing it.  She has my number if she has any questions.   Follow Up Plan:  No further follow up planned at this time. The patient has been provided with needed resources.  Raylin Diguglielmo, AAS Paralegal, [facility] Care Guide  Embedded Care Coordination [place]   Care Management  [address] [place], [place] [address] ??[email or web address]   ?? [phone]   [email or web address]

## 2021-04-01 NOTE — BH Specialist Note (Signed)
Integrated Behavioral Health via Telemedicine Visit  04/01/2021 [name] [id number]  Number of Integrated Behavioral Health Clinician visits: 41 Session Start time: 1100 Session End time: 1130 Total time in minutes: 39mn  Referring Provider: Dr. JLenise Herald MD Patient/Family location: Pt is home [facility] All persons participating in visit: Pt & Clinician Types of Service: Individual psychotherapy  I connected with Kathryn Klinefelterand/or [name]'s  self  via  Telephone or Video Enabled Telemedicine Application  (Video is Caregility application) and verified that I am speaking with the correct person using two identifiers. Discussed confidentiality: Yes   I discussed the limitations of telemedicine and the availability of in person appointments.  Discussed there is a possibility of technology failure and discussed alternative modes of communication if that failure occurs.  I discussed that engaging in this telemedicine visit, they consent to the provision of behavioral healthcare and the services will be billed under their insurance.  Patient and/or legal guardian expressed understanding and consented to Telemedicine visit: Yes   Presenting Concerns: Patient and/or family reports the following symptoms/concerns: upcoming reduction in Food Stamp benefits in March Duration of problem: years; Severity of problem: moderate  Patient and/or Family's Strengths/Protective Factors: Social and Emotional competence, Concrete supports in place (healthy food, safe environments, etc.), and Sense of purpose  Goals Addressed: Patient will:  Reduce symptoms of: anxiety and depression and social stressors w/Family members  Increase knowledge and/or ability of: coping skills, healthy habits, self-management skills, and stress reduction   Demonstrate ability to: Increase healthy adjustment to current life circumstances and  Increase adequate support systems for patient/family  Progress towards Goals: Ongoing  Interventions: Interventions utilized:  Solution-Focused Strategies and Supportive Counseling Standardized Assessments completed:  screeners prn  Patient and/or Family Response: Pt receptive to call today & requests future appts  Assessment: Patient currently experiencing elevated anx/dep due to being shut-in w/little resources for getting out in the world.   Patient may benefit from cont'd sessions to promote mental health wellness & flexibility.  Plan: Follow up with behavioral health clinician on : 3 wks on telehealth for 30 min ck-in Behavioral recommendations: Cont to work on the resources given  Referral(s): [facility] Clinic) and [facility](L[facility]/Outside Clinic) - f/u with [name]& other suggestions provided by Kathryn Birkenhead PA  I discussed the assessment and treatment plan with the patient and/or parent/guardian. They were provided an opportunity to ask questions and all were answered. They agreed with the plan and demonstrated an understanding of the instructions.   They were advised to call back or seek an in-person evaluation if the symptoms worsen or if the condition fails to improve as anticipated.  Kathryn Hutching LMFT

## 2021-04-01 NOTE — Telephone Encounter (Signed)
Patient reports patient self testing, point-of-care, finger-stick INR today = 1.8 (target INR 1.5 - 2.5). Instructed to continue same regimen: 43m on Monday; 2.562mon Tu/We/Th/Sa/Su; 1/2 x 2.5 mg on Friday. Repeat PST FS POC INR [id number] bleeding reported. Has adequate supply of warfarin on-hand.

## 2021-04-03 ENCOUNTER — Ambulatory Visit (INDEPENDENT_AMBULATORY_CARE_PROVIDER_SITE_OTHER): Payer: HMO | Admitting: Dietician

## 2021-04-03 ENCOUNTER — Encounter: Payer: Self-pay | Admitting: Dietician

## 2021-04-03 DIAGNOSIS — E1140 Type 2 diabetes mellitus with diabetic neuropathy, unspecified: Secondary | ICD-10-CM

## 2021-04-03 DIAGNOSIS — Z794 Long term (current) use of insulin: Secondary | ICD-10-CM | POA: Diagnosis not present

## 2021-04-03 NOTE — Progress Notes (Addendum)
Diabetes Self-Management Education  Visit Type: Follow-up (annual follow up)  Appt. Start Time: 815 Appt. End Time: 850  04/03/2021  [name], identified by name and date of birth, is a 71 y.o. female with a diagnosis of Diabetes:  .Type 2   This is a telephone encounter between [name]  and ,me  on 04/03/2021 for Diabetes Self Management Education & Support . The visit was conducted with the patient located at home and [name]  at [facility]. The patient's identity was confirmed using their DOB and current address. The patient has consented to being evaluated through a telephone encounter and understands the associated risks / benefits (allows the patient to remain at home, decreasing exposure to coronavirus). I personally spent 40 minutes on medical nutrition therapy discussion.   The following statements were read to the patient and/or legal guardian who are established with the provider.   "The purpose of this phone visit is to provide behavioral health care while limiting exposure to the coronavirus (COVID19).  There is a possibility of technology failure and discussed alternative modes of communication if that failure occurs."   "By engaging in this telephone visit, you consent to the provision of healthcare.  Additionally, you authorize for your insurance to be billed for the services provided during this telephone visit."    Patient and/or legal guardian consented to telephone visit: yes  ASSESSMENT:    Has a headache, maybe her high blood sugar- 341 352 this am; has not eaten yet Has an appointment with Dr. Jimmye Horn soon.  Wt Readings from Last 10 Encounters:  03/11/21 165 lb (74.8 kg)  02/22/21 163 lb 12.8 oz (74.3 kg)  12/27/20 165 lb (74.8 kg)  11/08/20 164 lb 6.4 oz (74.6 kg)  09/27/20 164 lb 9.6 oz (74.7 kg)  09/12/20 165 lb 2 oz (74.9 kg)  09/06/20 165 lb (74.8 kg)  09/04/20 163 lb (73.9 kg)  08/07/20 165 lb (74.8 kg)  08/02/20 167 lb 4.8  oz (75.9 kg)   Medication changes: no large changes, taking claritin, probiotic- align, prebiotic-ib gard, fiber- benefiber - prescribed by GI for bowel issues ("passed poop when passing gas, no decent BMs")   Her goal is for her blood sugar before meals to be ~ 150 mg/dl Discussed with Dr. Dareen Horn who approved a 2 unit increase in the xultophy to 42 units daily starting tomorrow BP Readings from Last 3 Encounters:  03/11/21 138/62  02/22/21 (!) 142/54  12/27/20 (!) 134/47    Diabetes Self-Management Education - 04/03/21 1400       Visit Information   Visit Type Follow-up   annual follow up     Health Coping   How would you rate your overall health? Fair   has a headache today     Psychosocial Assessment   Patient Belief/Attitude about Diabetes Motivated to manage diabetes    Self-care barriers Debilitated state due to current medical condition;Lack of transportation;Unsteady gait/risk for falls;Lack of material resources    Self-management support Doctor's office;Family;CDE visits    Patient Concerns Glycemic Control   Wants adjustment in diabetes meds to address her high blood sugar   Special Needs None    Preferred Learning Style No preference indicated    Learning Readiness Ready    How often do you need to have someone help you when you read instructions, pamphlets, or other written materials from your doctor or pharmacy? 1 - Never    What is the last grade level you  completed in school? 14      Pre-Education Assessment   Patient understands the diabetes disease and treatment process. Demonstrates understanding / competency    Patient understands incorporating nutritional management into lifestyle. Demonstrates understanding / competency   She says her Diet has not changed rasberry fig bars- 38 grams carb each, belvita- suggested lower carb. she counts and enters carbs in CGM. counts carbs trying to limit her carb intake   Patient undertands incorporating physical activity into  lifestyle. Demonstrates understanding / competency    Patient understands using medications safely. Demonstrates understanding / competency    Patient understands monitoring blood glucose, interpreting and using results Demonstrates understanding / competency    Patient understands prevention, detection, and treatment of acute complications. Demonstrates understanding / competency    Patient understands prevention, detection, and treatment of chronic complications. Demonstrates understanding / competency    Patient understands how to develop strategies to address psychosocial issues. Demonstrates understanding / competency    Patient understands how to develop strategies to promote health/change behavior. Demonstrates understanding / competency      Complications   Last HgB A1C per patient/outside source 7.1 %   10/2020   How often do you check your blood sugar? > 4 times/day   continuously   Fasting Blood glucose range (mg/dL) 180-200;>200    Postprandial Blood glucose range (mg/dL) 180-200;>200    Number of hypoglycemic episodes per month 0    Number of hyperglycemic episodes per week 75    Can you tell when your blood sugar is high? Yes   "sometimes"   What do you do if your blood sugar is high? drinks water, goes to sleep    Have you had a dilated eye exam in the past 12 months? Yes    Have you had a dental exam in the past 12 months? No   edentulous   Are you checking your feet? Yes    How many days per week are you checking your feet? 7      Dietary Intake   Breakfast fig bar, belvita    Lunch fig bar, mobile meal    Dinner shrimp ramen noodles    Snack (evening) peanut butter crackers    Beverage(s) water      Exercise   Exercise Type ADL's;Light (walking / raking leaves)    How many days per week to you exercise? --   house bound by breathing     Patient Education   Previous Diabetes Education Yes (please comment)   here and prior doctor offices   Nutrition management  Meal  options for control of blood glucose level and chronic complications.    Physical activity and exercise  Other (comment)   discussed CGM interpretation with patient and doctor [name]     Individualized Goals (developed by patient)   Medications take my medication as prescribed      Patient Self-Evaluation of Goals - Patient rates self as meeting previously set goals (% of time)   Medications >75%      Post-Education Assessment   Patient understands the diabetes disease and treatment process. Demonstrates understanding / competency    Patient understands incorporating nutritional management into lifestyle. Demonstrates understanding / competency    Patient undertands incorporating physical activity into lifestyle. Demonstrates understanding / competency    Patient understands using medications safely. Demonstrates understanding / competency    Patient understands monitoring blood glucose, interpreting and using results Demonstrates understanding / competency    Patient understands prevention,  detection, and treatment of acute complications. Demonstrates understanding / competency    Patient understands prevention, detection, and treatment of chronic complications. Demonstrates understanding / competency    Patient understands how to develop strategies to address psychosocial issues. Demonstrates understanding / competency    Patient understands how to develop strategies to promote health/change behavior. Demonstrates understanding / competency      Outcomes   Expected Outcomes Demonstrated interest in learning. Expect positive outcomes    Future DMSE 6 months    Program Status Completed      Subsequent Visit   Since your last visit have you continued or begun to take your medications as prescribed? Yes    Since your last visit have you had your blood pressure checked? Yes    Is your most recent blood pressure lower, unchanged, or higher since your last visit? Lower    Since your last  visit have you experienced any weight changes? No change    Since your last visit, are you checking your blood glucose at least once a day? Yes   continuously with freestyle libre 3            Individualized Plan for Diabetes Self-Management Training:   Learning Objective:  Patient will have a greater understanding of diabetes self-management. Patient education plan is to attend individual and/or group sessions per assessed needs and concerns.   Plan:   There are no Patient Instructions on file for this visit.  Expected Outcomes:  Demonstrated interest in learning. Expect positive outcomes  Education material provided: Diabetes Resources  If problems or questions, patient to contact team via:  Phone  Future DSME appointment: 6 months [name], RD 04/03/2021 3:21 PM.

## 2021-04-04 ENCOUNTER — Telehealth: Payer: Self-pay | Admitting: *Deleted

## 2021-04-04 ENCOUNTER — Other Ambulatory Visit: Payer: Self-pay

## 2021-04-04 ENCOUNTER — Encounter: Payer: HMO | Admitting: Internal Medicine

## 2021-04-04 DIAGNOSIS — G8929 Other chronic pain: Secondary | ICD-10-CM

## 2021-04-04 NOTE — Telephone Encounter (Signed)
Appointment Information  Name: [name], [name] MRN: [id number]  Date: 04/19/2021 Status: Sch  Time: 9:00 AM Length: 240  Visit Type: LFYBOFBP 102 [585] Copay: $0.00  Provider: [facility] Department: [facility]  Referring Provider: Angelica Pou CSN: [id number]  Notes: 1x Iv Ferrelicit  Made On: 4/00/8676 8:53 AM By: Nicholes Stairs D  I left message for patient with appointment for her Iron Infusion

## 2021-04-04 NOTE — Telephone Encounter (Signed)
Patient called in stating she has appt for iron transfusion but usually she receives a blood transfusion at the same time. She is asking if she should have more blood tests to determine if she needs both the iron and blood transfusions.

## 2021-04-05 ENCOUNTER — Other Ambulatory Visit: Payer: Self-pay | Admitting: *Deleted

## 2021-04-05 DIAGNOSIS — G8929 Other chronic pain: Secondary | ICD-10-CM

## 2021-04-05 MED ORDER — HYDROCODONE-ACETAMINOPHEN 5-325 MG PO TABS: 2.0000 | ORAL_TABLET | Freq: Three times a day (TID) | ORAL | 0 refills | Status: DC

## 2021-04-05 NOTE — Telephone Encounter (Signed)
RTC to patient informed er Dr. [name] that a blood transfusion will not be needed at the 04/19/2021 visit.   Blood work to be done later.

## 2021-04-10 ENCOUNTER — Ambulatory Visit (HOSPITAL_BASED_OUTPATIENT_CLINIC_OR_DEPARTMENT_OTHER): Payer: HMO | Attending: Internal Medicine | Admitting: Internal Medicine

## 2021-04-10 ENCOUNTER — Other Ambulatory Visit: Payer: Self-pay

## 2021-04-10 VITALS — Ht 62.0 in | Wt 164.0 lb

## 2021-04-10 DIAGNOSIS — R0683 Snoring: Secondary | ICD-10-CM | POA: Insufficient documentation

## 2021-04-10 DIAGNOSIS — G4733 Obstructive sleep apnea (adult) (pediatric): Secondary | ICD-10-CM | POA: Diagnosis not present

## 2021-04-10 DIAGNOSIS — I493 Ventricular premature depolarization: Secondary | ICD-10-CM | POA: Diagnosis not present

## 2021-04-10 DIAGNOSIS — R0902 Hypoxemia: Secondary | ICD-10-CM | POA: Diagnosis not present

## 2021-04-11 ENCOUNTER — Telehealth: Payer: Self-pay

## 2021-04-11 NOTE — Telephone Encounter (Signed)
PA  for pt ( XULTOPHY 100-3.6 UNIT - MG /ML PEN INJECTOR )  came through  on cover my meds  was submitted  with office notes .. awaiting approval or denial ... ? ? ? ? UPDATE : ? ? ? ?[employer] has not yet replied to your PA request. You may close this dialog, return to your dashboard, and perform other tasks. ?

## 2021-04-12 NOTE — Telephone Encounter (Signed)
Patient called in for information on PA for Xultophy. Explained it was sent to her insurance co with OV notes yesterday. Usually takes 48-72 hours for determination. She is requesting call back letting her know when it has been approved or denied.  ?

## 2021-04-14 DIAGNOSIS — G4733 Obstructive sleep apnea (adult) (pediatric): Secondary | ICD-10-CM | POA: Diagnosis not present

## 2021-04-14 NOTE — Procedures (Signed)
? ? ? ?  Patient Name: [name], [name] ?Study Date: 04/10/2021 ?Gender: Female ?D.O.B: 1950/10/21 ?Age (years): 20 ?Referring Provider: Jetty Duhamel MD, ABSM ?Height (inches): 62 ?Interpreting Physician: Jetty Duhamel MD, ABSM ?Weight (lbs): 164 ?RPSGT: Shelah Lewandowsky ?BMI: 30 ?MRN: [id number] ?Neck Size: 16.00 ? ?CLINICAL INFORMATION ?Sleep Study Type: NPSG ?Indication for sleep study: Congestive Heart Failure, COPD, Diabetes, Fatigue, Hypertension, Morning Headaches, Obesity, Re-Evaluation, Snoring ?Epworth Sleepiness Score: 21 ? ?Most recent polysomnogram dated 07/22/2019 revealed an AHI of 0.0/h and RDI of 2.5/h. ? ?SLEEP STUDY TECHNIQUE ?As per the AASM Manual for the Scoring of Sleep and Associated Events v2.3 (April 2016) with a hypopnea requiring 4% desaturations. ? ?The channels recorded and monitored were frontal, central and occipital EEG, electrooculogram (EOG), submentalis EMG (chin), nasal and oral airflow, thoracic and abdominal wall motion, anterior tibialis EMG, snore microphone, electrocardiogram, and pulse oximetry. ? ?MEDICATIONS ?Medications self-administered by patient taken the night of the study : ALPRAZOLAM, DULOXETINE, FLUTICASONE, GABAPENTIN, OMEPRAZOLE, CRESTOR, WARFARIN, IBgard ? ?SLEEP ARCHITECTURE ?The study was initiated at 11:16:47 PM and ended at 5:25:47 AM. ? ?Sleep onset time was 9.4 minutes and the sleep efficiency was 71.5%%. The total sleep time was 264 minutes. ? ?Stage REM latency was N/A minutes. ? ?The patient spent 10.4%% of the night in stage N1 sleep, 89.6%% in stage N2 sleep, 0.0%% in stage N3 and 0% in REM. ? ?Alpha intrusion was absent. ? ?Supine sleep was 45.89%. ? ?RESPIRATORY PARAMETERS ?The overall apnea/hypopnea index (AHI) was 0.0 per hour. There were 0 total apneas, including 0 obstructive, 0 central and 0 mixed apneas. There were 0 hypopneas and 6 RERAs. ? ?The AHI during Stage REM sleep was N/A per hour. ? ?AHI while supine was 0.0 per hour. ? ?The mean oxygen  saturation was 96.2%. The minimum SpO2 during sleep was 83.0%. ? ?moderate snoring was noted during this study. ? ?CARDIAC DATA ?The 2 lead EKG demonstrated sinus rhythm. The mean heart rate was 68.2 beats per minute. Other EKG findings include: PVCs. ? ?LEG MOVEMENT DATA ?The total PLMS were 0 with a resulting PLMS index of 0.0. Associated arousal with leg movement index was 0.2 . ? ?IMPRESSIONS ?- No significant obstructive sleep apnea occurred during this study (AHI = 0.0/h). ?- Oxygen desaturation was noted during this study (Min O2 = 83.0%). Per protocol O2 was added at 1L nasal prongs at 23:47 due to sustained low saturation, with subsequent mean O2 saturation 96.2%. ?- The patient snored with moderate snoring volume. ?- EKG findings include PVCs. ?- Patient's Free Style Libre device alarmed at 03:21 for glucose 69. She was given orange juice, raising reading to 72 and sleep study resumed. ?- Clinically significant periodic limb movements did not occur during sleep. No significant associated arousals. ? ?DIAGNOSIS ?- Nocturnal Hypoxemia (G47.36) ? ?RECOMMENDATIONS ?- Patient may qualify for supplemental oxygen during sleep. ?- Sleep hygiene should be reviewed to assess factors that may improve sleep quality. ?- Weight management and regular exercise should be initiated or continued if appropriate. ? ?[Electronically signed] 04/14/2021 11:58 AM ? ?Jetty Duhamel MD, ABSM ?Diplomate, American Board of Sleep Medicine ? ? ?NPI: [id number] ?  ? ? ? ? ? ? ? ? ? ? ? ? ? ? ? ? ? ? ? ? ? ?Kelsie Zaborowski ?Diplomate, Biomedical engineer of Sleep Medicine ? ?ELECTRONICALLY SIGNED ON:  04/14/2021, 11:50 AM ?[facility] ?PH: [phone]   FX: [phone] ?ACCREDITED BY THE AMERICAN ACADEMY OF SLEEP MEDICINE ?

## 2021-04-15 ENCOUNTER — Telehealth: Payer: Self-pay | Admitting: Pharmacist

## 2021-04-15 DIAGNOSIS — Z7901 Long term (current) use of anticoagulants: Secondary | ICD-10-CM | POA: Diagnosis not present

## 2021-04-15 DIAGNOSIS — I480 Paroxysmal atrial fibrillation: Secondary | ICD-10-CM | POA: Diagnosis not present

## 2021-04-15 NOTE — Telephone Encounter (Signed)
DECISION : ? ? ? ? DENIED  ? ? For the following reasons : ? ? ?Message from Plan ?Your request has been denied as the patient has not tried or failed a formulary alternative used to treat the condition, nor has the supporting statement provided indicate why the use of a formulary product would be inappropriate. The formulary alter ? ? ? ? ? ? ? ( PCP MADE AWARE AND COPY PLACED TO BE SCANNED IN CHART ALSO )  ?

## 2021-04-15 NOTE — Telephone Encounter (Signed)
Patient reports patient-self-testing, finger-stick, point-of-care INR as 2.0 (target goal 1.5 - 2.5). Will leave on same regimen of: Su-2.52mM-0mgT-2.5mgW-2.5mgTh-2.5mgF-1.25mgSa-2.5mg. Repeat INR on 13-MAR-23. No symptoms of bleeding.  ?

## 2021-04-16 NOTE — Telephone Encounter (Signed)
Maybe she wouldn't mind doing one more injection a week using a weekly GLP-1 with 42 units of degludec daily?

## 2021-04-18 ENCOUNTER — Ambulatory Visit: Payer: HMO | Admitting: Licensed Clinical Social Worker

## 2021-04-18 NOTE — Chronic Care Management (AMB) (Signed)
?  Care Management  ? ?Social Work Visit Note ? ?04/18/2021 ?Name: [name] MRN: [id number] DOB: May 13, 1950 ? ?[name] is a 71 y.o. year old female who sees [name], [name], MD for primary care. The care management team was consulted for assistance with care management and care coordination needs related to [facility] Resources   ? ?Patient was given the following information about care management and care coordination services today, agreed to services, and gave verbal consent: 1.care management/care coordination services include personalized support from designated clinical staff supervised by their physician, including individualized plan of care and coordination with other care providers 2. 24/7 contact phone numbers for assistance for urgent and routine care needs. 3. The patient may stop care management/care coordination services at any time by phone call to the office staff. ? ?Engaged with patient by telephone for follow up visit in response to provider referral for social work chronic care management and care coordination services. ? ?Assessment: Review of patient history, allergies, and health status during evaluation of patient need for care management/care coordination services.   ? ?Interventions:  ?Patient interviewed and appropriate assessments performed ?Collaborated with clinical team regarding patient needs  ?Patient and SW discussed volunteers to assist with repairs of patients home. SW unable to identify any volunteers thus far. Patient will reach out to [employer] for assistance.  ?SW reviewed future appointments with  patients.  ? ? ?  Plan:  ?SW will follow up with patient within 60 days. ? ?Milus Height, BSW  ?Social Worker ?[facility] Care Management  ?[phone] ?  ? ? ? ? ? ? ? ? ? ? ? ? ? ? ?

## 2021-04-18 NOTE — Chronic Care Management (AMB) (Deleted)
?  Care Management  ? ?Social Work Visit Note ? ?04/18/2021 ?Name: [name] MRN: [id number] DOB: [birth year] ? ?[name] is a 71 y.o. year old female who sees [name], [name], MD for primary care. The care management team was consulted for assistance with care management and care coordination needs related to {CCM SW CONSULT REASONS:25078}  ? ?{CAREMANAGEMENTCONSENTOPTIONS:24923} ? ?{CCMTELEPHONEFACETOFACE:24906} for {CCMINITIALFOLLOWUPCHOICE:24907} in response to provider referral for social work chronic care management and care coordination services. ? ?Assessment: Review of patient history, allergies, and health status during evaluation of patient need for care management/care coordination services.   ? ?Interventions:  ?Patient interviewed and appropriate assessments performed ?Collaborated with clinical team regarding patient needs  ?{MMBSWINTERVENTIONS:24201} ? ?SDOH (Social Determinants of Health) assessments performed: {yes/no:20286} ?   ? ?Plan:  ?patient will work with BSW to address needs related to {MM SW BARRIERS:24200} ?{MM FOLLOW UP TNBZ:96728} ? ?SIGNATURE ? ? ? ? ? ? ? ? ? ? ? ? ? ? ?

## 2021-04-18 NOTE — Patient Instructions (Signed)
Visit Information ? ?Instructions: patient will work with SW to address concerns related to housing repairs. ? ?Patient was given the following information about care management and care coordination services today, agreed to services, and gave verbal consent: 1.care management/care coordination services include personalized support from designated clinical staff supervised by their physician, including individualized plan of care and coordination with other care providers 2. 24/7 contact phone numbers for assistance for urgent and routine care needs. 3. The patient may stop care management/care coordination services at any time by phone call to the office staff. ? ?Patient verbalizes understanding of instructions and care plan provided today and agrees to view in [facility]. Active MyChart status confirmed with patient.   ? ?The care management team will reach out to the patient again over the next 60 days.  ? ?Milus Height, BSW  ?Social Worker ?[facility] Care Management  ?[phone] ?  ? ?  ?

## 2021-04-19 ENCOUNTER — Telehealth: Payer: Self-pay | Admitting: *Deleted

## 2021-04-19 ENCOUNTER — Encounter (HOSPITAL_COMMUNITY)
Admission: RE | Admit: 2021-04-19 | Discharge: 2021-04-19 | Disposition: A | Discharge status: Home / Self Care | Payer: HMO | Source: Ambulatory Visit | Attending: Internal Medicine | Admitting: Internal Medicine

## 2021-04-19 DIAGNOSIS — D509 Iron deficiency anemia, unspecified: Secondary | ICD-10-CM

## 2021-04-19 MED ORDER — DIPHENHYDRAMINE HCL 25 MG PO CAPS
25.0000 mg | ORAL_CAPSULE | Freq: Once | ORAL | Status: AC
  Administered 2021-04-19: 25 mg via ORAL

## 2021-04-19 MED ORDER — SODIUM CHLORIDE 0.9 % IV SOLN
250.0000 mg | Freq: Once | INTRAVENOUS | Status: AC
  Administered 2021-04-19: 250 mg via INTRAVENOUS
  Filled 2021-04-19: qty 20

## 2021-04-19 MED ORDER — DIPHENHYDRAMINE HCL 25 MG PO CAPS
ORAL_CAPSULE | ORAL | Status: AC
  Filled 2021-04-19: qty 1

## 2021-04-19 NOTE — Telephone Encounter (Signed)
Received call from [facility] in Short Stay. Patient is there now receiving Ferrlecit 250 mg IV. Patient began c/o "mild h/a" and itching on chest and mid back. No rash present. BP prior to infusion 146/37 P 69. Right now BP 134/40 P 63. Patient has been up to BR, no other c/o. Patient was driven to hospital by friend. Discussed with today's Attending. VO to give benadryl 25 mg PO. If rash appears or BP drops, patient to go to ED. Ginny voiced understanding. ?

## 2021-04-19 NOTE — Progress Notes (Signed)
Spoke with Lauren at Dr. [name] office re: patient c/o mild headache and itching to chest and back. Per Dr. [name] / Ander Purpura we can give benadryl 25 mg po. (Bp 134*/40,  HR 63) . Per Dr. [name] / Ander Purpura if patient develops rash or BP drops Patient should go to ED. ? ?

## 2021-04-22 ENCOUNTER — Telehealth: Payer: Self-pay | Admitting: Pharmacist

## 2021-04-22 ENCOUNTER — Other Ambulatory Visit: Payer: Self-pay

## 2021-04-22 DIAGNOSIS — E1140 Type 2 diabetes mellitus with diabetic neuropathy, unspecified: Secondary | ICD-10-CM

## 2021-04-22 MED ORDER — GABAPENTIN 300 MG PO CAPS
600.0000 mg | ORAL_CAPSULE | Freq: Three times a day (TID) | ORAL | 3 refills | Status: DC
  Filled 2021-04-25: qty 540, 90d supply, fill #0
  Filled 2021-07-25: qty 540, 90d supply, fill #1
  Filled 2021-09-07: qty 540, 90d supply, fill #2

## 2021-04-22 NOTE — Telephone Encounter (Addendum)
Received call from Merrilee Seashore at [facility] requesting 90 day supply of lidocaine patches. Will need exact qty as Sig states apply 1-2 daily. ?

## 2021-04-22 NOTE — Telephone Encounter (Signed)
Patient provided results of PST FS POC INR = 2.4 (goal 1.5-2.5). Advised her to continue same regimen: 72m M/2.567mTu/We/Th/Sat/Sun; 1/2 x 2.45m57m1.245m68mn Friday. Repeat PST FS POC INR Monday 20-MAR-23. No bleeding reported.  ?

## 2021-04-22 NOTE — Telephone Encounter (Signed)
Patient calls to find out more about what she can take instead of xultophy. She called her insurance and pharmacy but was not informed of the alternatives. She is willing to take another injection if needed. She is also concerned about cost as well. She still has a 5 xultophy pens left.  ?

## 2021-04-23 ENCOUNTER — Other Ambulatory Visit (HOSPITAL_COMMUNITY): Payer: Self-pay

## 2021-04-23 IMAGING — CT CT HEAD W/O CM
4 series · 16 of 47 positions shown, 18 images · non-contrast
Comparison: February 19, 2018

CLINICAL DATA: Headache.  Recent fall

EXAM:
CT HEAD WITHOUT CONTRAST
TECHNIQUE: Contiguous axial images were obtained from the base of the skull
through the vertex without intravenous contrast.

[Series 3: head without · axial · non-contrast · 0.42mm/px · z∈[-42,+78]mm · 7 of 33 slices shown, 9 images]
[im 5/33  brain]
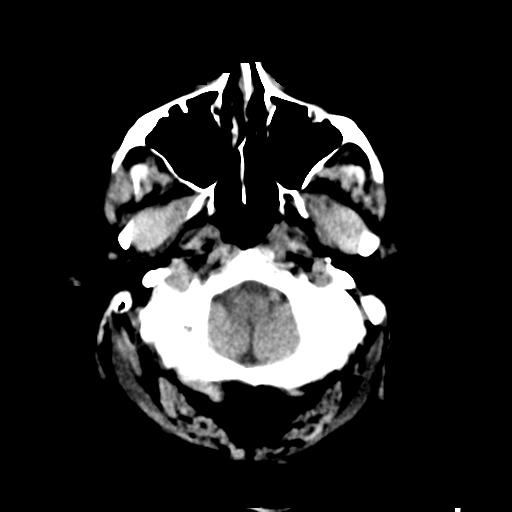
[im 5/33  bone]
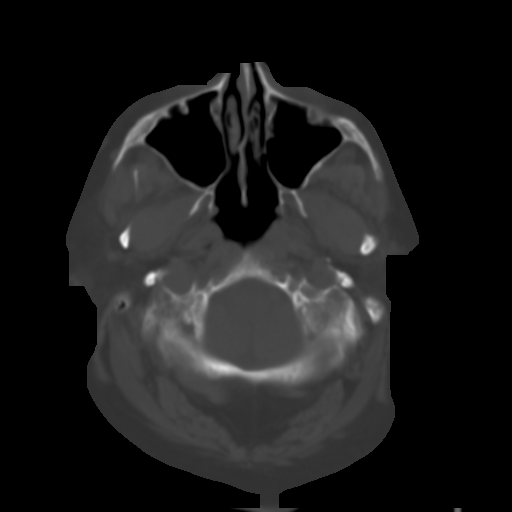
[im 9/33  brain]
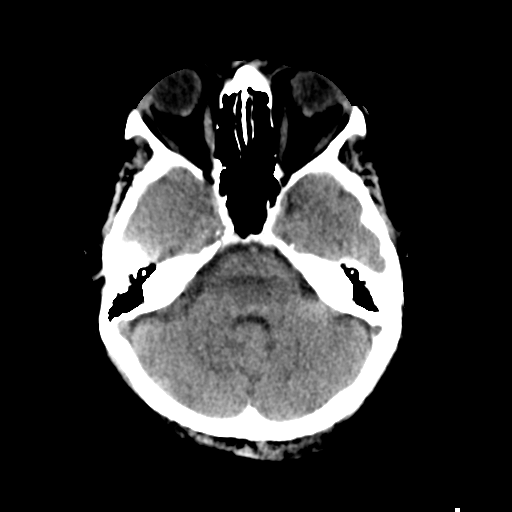
[im 13/33  brain]
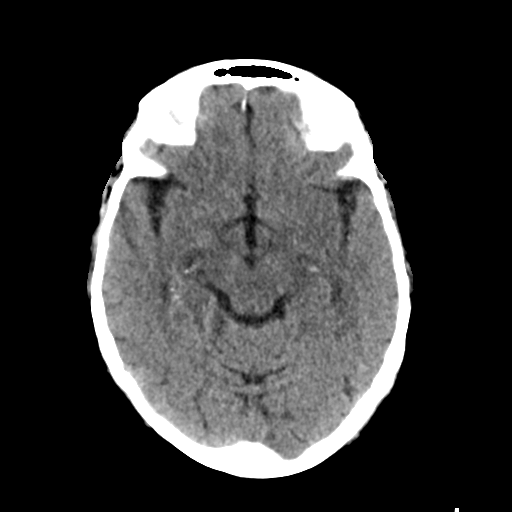
[im 17/33  brain]
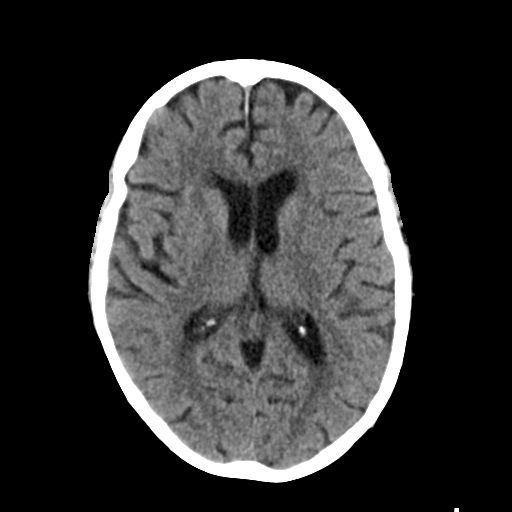
[im 21/33  brain]
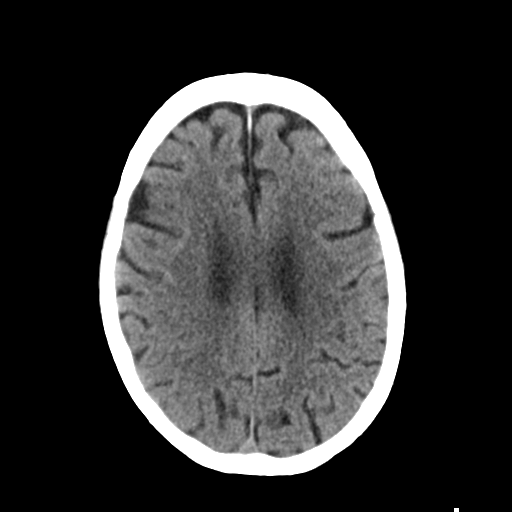
[im 21/33  bone]
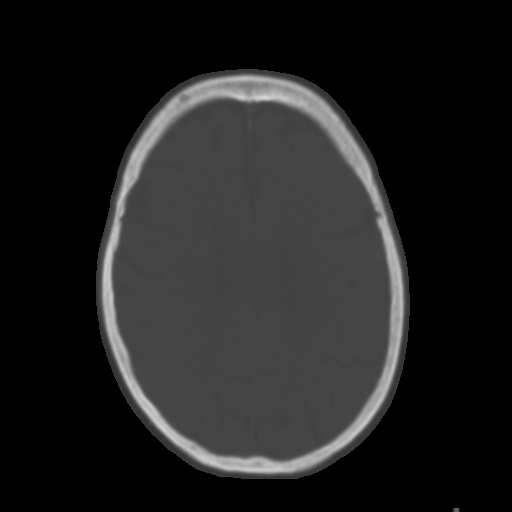
[im 25/33  brain]
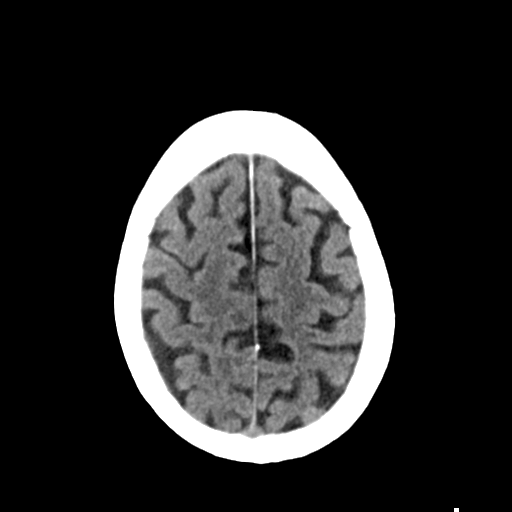
[im 29/33  brain]
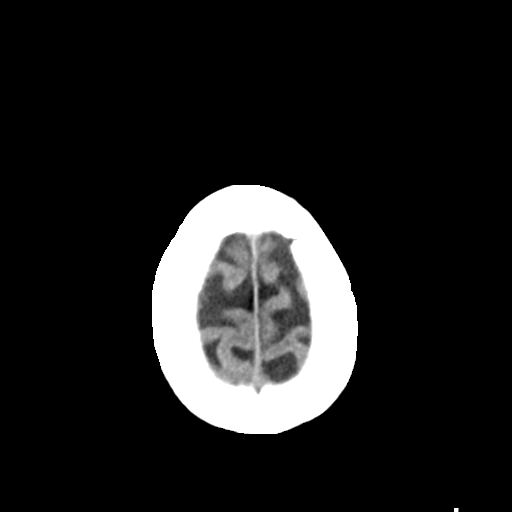

[Series 4: head bone · axial · 0.42mm/px · z∈[-46,-14]mm · 3 of 81 slices shown]
[im 9/81  bone]
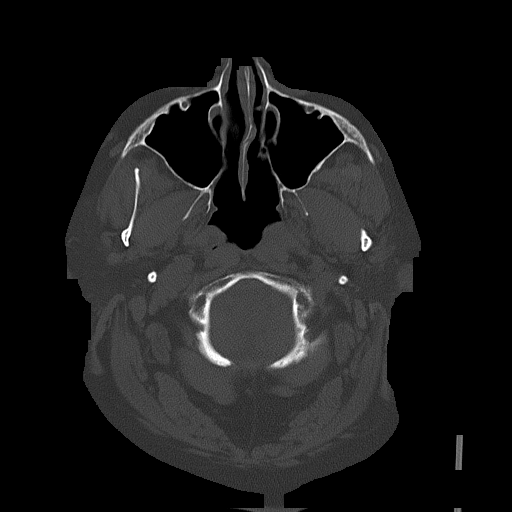
[im 17/81  bone]
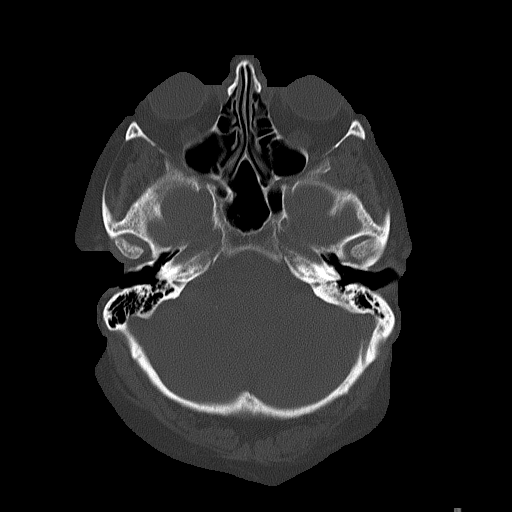
[im 25/81  bone]
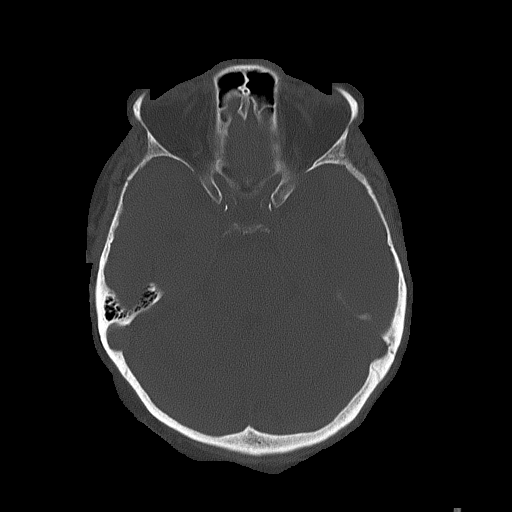

[Series 5: head without cor · coronal · non-contrast · 0.32mm/px · 3 of 67 slices shown]
[im 23/67  brain]
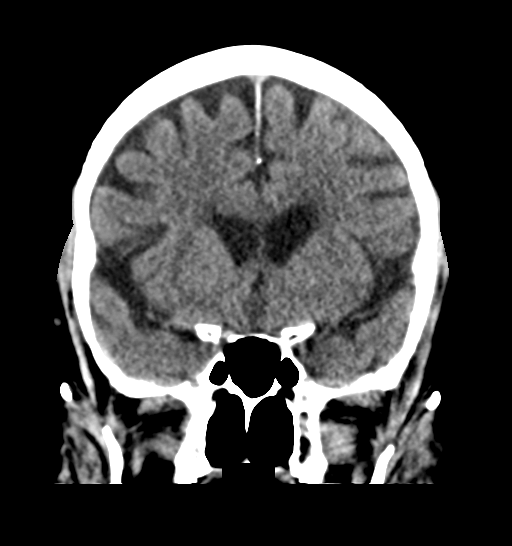
[im 30/67  brain]
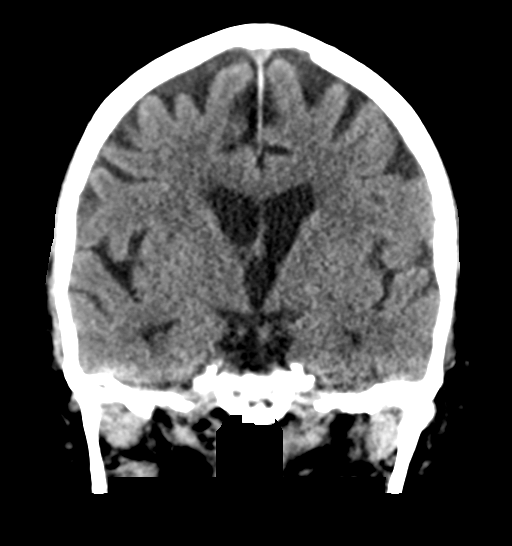
[im 37/67  brain]
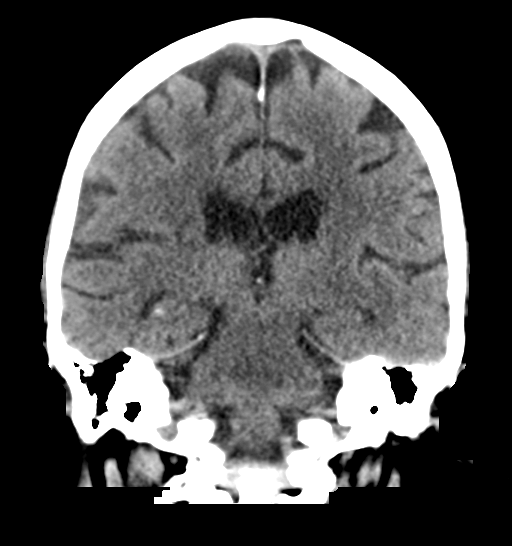

[Series 6: head without sag · sagittal · non-contrast · 0.30mm/px · 3 of 53 slices shown]
[im 18/53  brain]
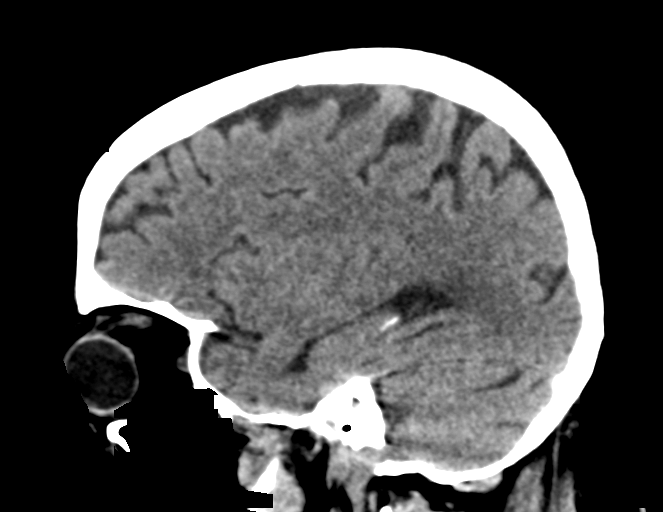
[im 27/53  brain]
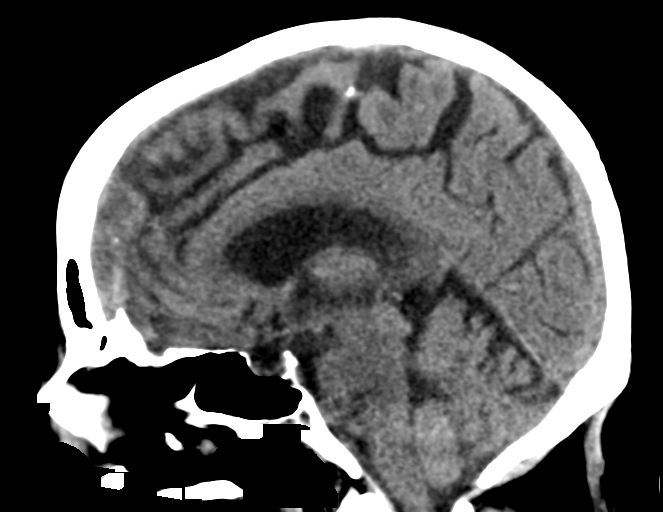
[im 35/53  brain]
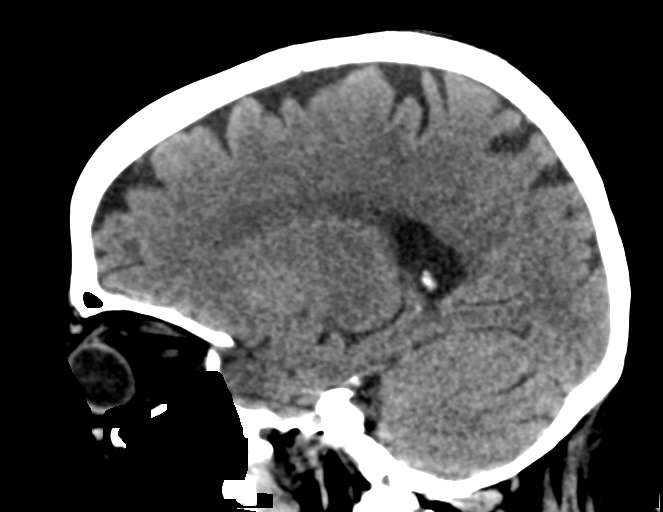

[16 of 47 positions shown; findings below may reference images not displayed]

FINDINGS: Brain: There is mild diffuse atrophy. There is no intracranial mass,
hemorrhage, extra-axial fluid collection, or midline shift. There is
mild patchy small vessel disease in the centra semiovale
bilaterally. Small vessel disease is also noted in the anterior limb
of the right external capsule. No acute infarct evident.

Vascular: No hyperdense vessel. There is calcification in each
carotid siphon region.

Skull: The bony calvarium appears intact.

Sinuses/Orbits: There is mucosal thickening in several ethmoid air
cells. Other visualized paranasal sinuses are clear. Orbits appear
symmetric bilaterally.

Other: Mastoid air cells are clear.
IMPRESSION: Stable mild atrophy with patchy supratentorial small vessel disease.
No acute infarct. No mass or hemorrhage.

There are foci of arterial vascular calcification. There is mucosal
thickening in several ethmoid air cells.

## 2021-04-23 NOTE — Telephone Encounter (Addendum)
Patient called in stating she is having trouble with [facility] and does not want to use them anymore. This has been removed from Pharmacy list. Going forward, patient wants to use [facility] as they deliver. She will contact them today and ask them to transfer Rxs from [employer] [facility] and [facility]. ?

## 2021-04-24 ENCOUNTER — Other Ambulatory Visit (HOSPITAL_COMMUNITY): Payer: Self-pay

## 2021-04-25 ENCOUNTER — Ambulatory Visit: Payer: HMO | Admitting: Licensed Clinical Social Worker

## 2021-04-25 ENCOUNTER — Ambulatory Visit: Payer: HMO | Admitting: Behavioral Health

## 2021-04-25 ENCOUNTER — Other Ambulatory Visit (HOSPITAL_COMMUNITY): Payer: Self-pay

## 2021-04-25 DIAGNOSIS — F331 Major depressive disorder, recurrent, moderate: Secondary | ICD-10-CM

## 2021-04-25 DIAGNOSIS — F419 Anxiety disorder, unspecified: Secondary | ICD-10-CM

## 2021-04-25 MED ORDER — OMEPRAZOLE 40 MG PO CPDR
DELAYED_RELEASE_CAPSULE | ORAL | 2 refills | Status: DC
  Filled 2021-07-25: qty 180, 90d supply, fill #0

## 2021-04-25 MED ORDER — DULOXETINE HCL 60 MG PO CPEP
ORAL_CAPSULE | ORAL | 3 refills | Status: DC
  Filled 2021-04-25 – 2021-07-25 (×2): qty 180, 90d supply, fill #0
  Filled 2021-09-07 – 2021-10-22 (×2): qty 180, 90d supply, fill #1

## 2021-04-25 NOTE — Chronic Care Management (AMB) (Signed)
?  Care Management  ? ?Social Work Visit Note ? ?04/25/2021 ?Name: Aviana Shevlin MRN: [id number] DOB: 01-02-1951 ? ?Ziva Nunziata is a 71 y.o. year old female who sees [name], [name], MD for primary care. The care management team was consulted for assistance with care management and care coordination needs related to Level of Care Concerns  ? ?SW contacted the following agencies to inquire about volunteers to help with repairs.  ? ?[employer] ? ?Home Repair Program for Low-income Homeowners ? ?Current programs are not offering assistance at this time.  ? ?Milus Height, BSW  ?Social Worker ?[facility] Care Management  ?[phone] ?  ? ? ? ? ? ? ? ? ? ? ? ?

## 2021-04-25 NOTE — BH Specialist Note (Signed)
Integrated Behavioral Health via Telemedicine Visit ? ?04/25/2021 ?[name] ?[id number] ? ?Number of [place] Clinician visits: 17 ?Session Start time: 1100 ?Session End time: 1130 ?Total time in minutes: 30 min ? ?Referring Provider: Dr. Lenise Herald, MD ?Patient/Family location: Pt is home in private ?[facility] Provider location: [facility] Office ?All persons participating in visit: Pt & Clinician ?Types of Service: Individual psychotherapy ? ?I connected with [name] and/or Evans Lance Wilmott's  self  via  Telephone or Geologist, engineering  (Video is Caregility application) and verified that I am speaking with the correct person using two identifiers. Discussed confidentiality: Yes  ? ?I discussed the limitations of telemedicine and the availability of in person appointments.  Discussed there is a possibility of technology failure and discussed alternative modes of communication if that failure occurs. ? ?I discussed that engaging in this telemedicine visit, they consent to the provision of behavioral healthcare and the services will be billed under their insurance. ? ?Patient and/or legal guardian expressed understanding and consented to Telemedicine visit: Yes  ? ?Presenting Concerns: ?Patient and/or family reports the following symptoms/concerns: elevated concerns for her health status, recent Bday that was good w/Family ?Duration of problem: years; Severity of problem: moderate; Pt has a positive sense of humor ? ?Patient and/or Family's Strengths/Protective Factors: ?Social and Emotional competence, Concrete supports in place (healthy food, safe environments, etc.), Sense of purpose, and Physical Health (exercise, healthy diet, medication compliance, etc.) ? ?Goals Addressed: ?Patient will: ? Reduce symptoms of: anxiety, depression, and stress  ? Increase knowledge and/or ability of: coping skills, healthy habits, and stress reduction  ? Demonstrate  ability to: Increase healthy adjustment to current life circumstances ? ?Progress towards Goals: ?Ongoing ? ?Interventions: ?Interventions utilized:  Solution-Focused Strategies and Supportive Counseling ?Standardized Assessments completed:  screeners prn ? ?Patient and/or Family Response: Pt receptive to visit today & requests future appt ? ?Assessment: ?Patient currently experiencing elevated anx/dep due to her health issues. She is exp'g issues since changing her Mail Order scripts w/[facility] to [employer] [facility]. ? ?Pt's CPAP m/c has been broken.  ? ?Her Sleep Study results are OSA. ? ?Patient may benefit from cont'd Cslg for mental health wellness. ? ?Plan: ?Follow up with behavioral health clinician on : 3 wks for 30 min on telehealth ?Behavioral recommendations: None today ?Referral(s): [place] (In Clinic) ? ?I discussed the assessment and treatment plan with the patient and/or parent/guardian. They were provided an opportunity to ask questions and all were answered. They agreed with the plan and demonstrated an understanding of the instructions. ?  ?They were advised to call back or seek an in-person evaluation if the symptoms worsen or if the condition fails to improve as anticipated. ? ?Donnetta Hutching, LMFT ?

## 2021-04-26 ENCOUNTER — Other Ambulatory Visit (HOSPITAL_COMMUNITY): Payer: Self-pay

## 2021-04-29 ENCOUNTER — Telehealth: Payer: Self-pay | Admitting: Pharmacist

## 2021-04-29 ENCOUNTER — Other Ambulatory Visit (HOSPITAL_COMMUNITY): Payer: Self-pay

## 2021-04-29 NOTE — Telephone Encounter (Signed)
Patient reports PST FS POC INR value for today = 1.9 (target 1.5 - 2.5). Will continue same regimen. Repeat PST FS POC INR in one week (27-MAR-23).  ?

## 2021-04-30 ENCOUNTER — Other Ambulatory Visit (HOSPITAL_COMMUNITY): Payer: Self-pay

## 2021-05-02 ENCOUNTER — Other Ambulatory Visit: Payer: Self-pay

## 2021-05-02 ENCOUNTER — Ambulatory Visit (INDEPENDENT_AMBULATORY_CARE_PROVIDER_SITE_OTHER): Payer: HMO | Admitting: Internal Medicine

## 2021-05-02 VITALS — BP 135/51 | HR 66 | Temp 98.2°F | Ht 62.0 in | Wt 166.5 lb

## 2021-05-02 DIAGNOSIS — I110 Hypertensive heart disease with heart failure: Secondary | ICD-10-CM

## 2021-05-02 DIAGNOSIS — D509 Iron deficiency anemia, unspecified: Secondary | ICD-10-CM | POA: Diagnosis not present

## 2021-05-02 DIAGNOSIS — Z720 Tobacco use: Secondary | ICD-10-CM

## 2021-05-02 DIAGNOSIS — Z794 Long term (current) use of insulin: Secondary | ICD-10-CM | POA: Diagnosis not present

## 2021-05-02 DIAGNOSIS — M5441 Lumbago with sciatica, right side: Secondary | ICD-10-CM | POA: Diagnosis not present

## 2021-05-02 DIAGNOSIS — E1151 Type 2 diabetes mellitus with diabetic peripheral angiopathy without gangrene: Secondary | ICD-10-CM

## 2021-05-02 DIAGNOSIS — G8929 Other chronic pain: Secondary | ICD-10-CM

## 2021-05-02 DIAGNOSIS — J449 Chronic obstructive pulmonary disease, unspecified: Secondary | ICD-10-CM

## 2021-05-02 DIAGNOSIS — M797 Fibromyalgia: Secondary | ICD-10-CM | POA: Diagnosis not present

## 2021-05-02 DIAGNOSIS — I5032 Chronic diastolic (congestive) heart failure: Secondary | ICD-10-CM | POA: Diagnosis not present

## 2021-05-02 DIAGNOSIS — I480 Paroxysmal atrial fibrillation: Secondary | ICD-10-CM

## 2021-05-02 DIAGNOSIS — M810 Age-related osteoporosis without current pathological fracture: Secondary | ICD-10-CM | POA: Diagnosis not present

## 2021-05-02 DIAGNOSIS — I739 Peripheral vascular disease, unspecified: Secondary | ICD-10-CM

## 2021-05-02 DIAGNOSIS — I10 Essential (primary) hypertension: Secondary | ICD-10-CM

## 2021-05-02 DIAGNOSIS — E1140 Type 2 diabetes mellitus with diabetic neuropathy, unspecified: Secondary | ICD-10-CM

## 2021-05-02 LAB — POCT GLYCOSYLATED HEMOGLOBIN (HGB A1C): Hemoglobin A1C: 7.2 % — AB (ref 4.0–5.6)

## 2021-05-02 LAB — GLUCOSE, CAPILLARY: Glucose-Capillary: 188 mg/dL — ABNORMAL HIGH (ref 70–99)

## 2021-05-02 NOTE — Progress Notes (Signed)
I feel terrible.    Dr. Quay Burow visit - very poletily told me there's nothing I can do for you.  I'm getting fed up.  Nothing is happening.  Pain medicine - what is it going to be and how can I research it.  I need to see you more frequently.    Needs a signed order from me for a bone density test. (Already in)  I need to know what's going on with my xultophy - no longer covered.  Will need a replacement.    What do I think about Live Rosin gummies, 250 mg THC by [employer].  Thinks it might have helped.    Neck is hurting motions to left last night couldn't touch the neck .  Bothering for past couple of months.  THis morning hurt on the R.  Expensive comfortable pillows.    Patient Active Problem List   Diagnosis Date Noted   Polypharmacy 11/08/2020   Personal history of renal cell carcinoma 09/12/2020   Tinea pedis of both feet 08/07/2020   Nocturnal hypoxia per sleep study 07/2019    Chronic iron deficiency anemia    Carotid artery stenosis; s/p R endarterectomy    COPD mixed type ([place])    Arthritis of both hands 07/01/2019   Pain due to onychomycosis of toenails of both feet 05/27/2019   Benign hypertensive heart and CKD, stage 3 (GFR 30-59), w CHF ([place]) 99991111   Lichen sclerosus of female genitalia 01/12/2017   Mixed stress and urge urinary incontinence 05/27/2016   Pulmonary hypertension due to chronic obstructive pulmonary disease ([place]) 04/25/2016   Asymptomatic cholelithiasis 09/25/2015   Aortic atherosclerosis ([place]) 10/19/2014   Right nephrolithiasis, asymptomatic (incidental finding) 09/06/2014   Chronic low back pain 10/06/2012   Long term current use of anticoagulant therapy 09/30/2012   Chronic venous insufficiency 08/04/2012   Recurrent candidal intertrigo 07/28/2012   Tobacco abuse 07/28/2012   Obesity (BMI 30-39.9) 10/23/2011   Personal history of colonic polyps 05/14/2011   Sleep disturbance 05/14/2011   Abdominal wall hernia 05/14/2011    Health care maintenance 11/04/2010   PAF (paroxysmal atrial fibrillation) ([place]) 10/22/2010   Chronic congestive heart failure with left ventricular diastolic dysfunction ([place]) 10/21/2010   Fibromyalgia 08/29/2010   Gastroesophageal reflux disease    Type 2 diabetes mellitus with diabetic neuropathy (HCC)    Osteoporosis    OSA (obstructive sleep apnea)    Peripheral arterial disease ([place]) 05/27/2010   History of mitral valve replacement with bioprosthetic valve 2012   Essential hypertension 06/19/2008   Hyperlipidemia 11/20/2005   Moderate major depression ([place]) 11/19/2005   Current Outpatient Medications:    buPROPion (WELLBUTRIN XL) 150 MG 24 hr tablet, Take 1 tablet by mouth every morning, Disp: 90 tablet, Rfl: 3   Continuous Blood Gluc Sensor (FREESTYLE LIBRE 3 SENSOR) MISC, Place 1 sensor on the skin every 14 days. Use to check glucose continuously, Disp: 6 each, Rfl: 3   gabapentin (NEURONTIN) 300 MG capsule, Take 2 capsules by mouth 3 times daily., Disp: 540 capsule, Rfl: 3   lidocaine (LIDODERM) 5 %, Place 1 or 2 patches to painful area of back each day.  Remove & Discard patch within 12 hours or as directed by MD, Disp: 60 patch, Rfl: 2   metoprolol succinate (TOPROL-XL) 50 MG 24 hr tablet, Take 1 tablet by mouth daily. Take with or immediately following a meal., Disp: 90 tablet, Rfl: 3   OXYGEN, Inhale 2 L into the lungs at  bedtime., Disp: , Rfl:    Probiotic Product (ALIGN) 4 MG CAPS, See admin instructions., Disp: , Rfl:    warfarin (COUMADIN) 2.5 MG tablet, Take 1 tablet by mouth daily unless otherwise instructed., Disp: 90 tablet, Rfl: 3   Wheat Dextrin (BENEFIBER) POWD, See admin instructions., Disp: , Rfl:    albuterol (PROAIR HFA) 108 (90 Base) MCG/ACT inhaler, Inhale 2 puffs into the lungs every 6 (six) hours as needed for shortness of breath., Disp: 18 g, Rfl: 5   ALPRAZolam (XANAX) 1 MG tablet, TAKE 1 TABLET BY MOUTH EVERY NIGHT AT BEDTIME AS NEEDED FOR INSOMNIA. MAY TAKE  1/2 TABLET BY MOUTH DURING THE DAY FOR ANXIETY, Disp: 45 tablet, Rfl: 0   benzonatate (TESSALON) 100 MG capsule, Take 1 capsule (100 mg total) by mouth 3 (three) times daily as needed for cough., Disp: 90 capsule, Rfl: 11   DULoxetine (CYMBALTA) 60 MG capsule, Take 1 capsule by mouth twice a day., Disp: 180 capsule, Rfl: 3   fluticasone (FLONASE) 50 MCG/ACT nasal spray, Place 2 sprays into both nostrils daily., Disp: 54 g, Rfl: 3   Fluticasone-Umeclidin-Vilant 100-62.5-25 MCG/ACT AEPB, Inhale 1 puff into the lungs daily., Disp: 180 each, Rfl: 4   furosemide (LASIX) 80 MG tablet, Take 1 tablet by mouth every morning AND 1/2 tablet daily in the afternoon., Disp: 135 tablet, Rfl: 3   glucose blood (ONETOUCH VERIO) test strip, Use to check blood sugar up to 7 times weekly. diag code E11.40. Insulin dependent (Patient not taking: Reported on 02/22/2021), Disp: 25 each, Rfl: 8   HYDROcodone-acetaminophen (NORCO/VICODIN) 5-325 MG tablet, Take 2 tablets by mouth 3 (three) times daily., Disp: 180 tablet, Rfl: 0   insulin degludec (TRESIBA FLEXTOUCH) 100 UNIT/ML FlexTouch Pen, Inject 42 Units into the skin daily., Disp: 12 mL, Rfl: 5   Insulin Pen Needle 32G X 4 MM MISC, Use to inject insulin up to 6 times a day, Disp: 500 each, Rfl: 3   ipratropium-albuterol (DUONEB) 0.5-2.5 (3) MG/3ML SOLN, Take 3 mLs by nebulization every 6 (six) hours as needed (shortness of breath, wheezing)., Disp: 90 mL, Rfl: 3   ketoconazole (NIZORAL) 2 % cream, Apply to affected areas nightly at bedtime after cleaning area properly, Disp: 30 g, Rfl: 0   Lancets Misc. (ACCU-CHEK FASTCLIX LANCET) KIT, Check your blood 4 times a day dx code 250.00 insulin requiring, Disp: 1 kit, Rfl: 2   nystatin powder, Apply topically 3  times daily., Disp: 60 g, Rfl: 4   omeprazole (PRILOSEC) 40 MG capsule, Take 1 capsule by mouth in the morning and at bedtime., Disp: 180 capsule, Rfl: 2   ondansetron (ZOFRAN) 4 MG tablet, Take 1 tablet (4 mg total) by  mouth every 8 (eight) hours as needed for nausea or vomiting., Disp: 21 tablet, Rfl: 0   PRESCRIPTION MEDICATION, Inhale into the lungs at bedtime. CPAP, Disp: , Rfl:    rosuvastatin (CRESTOR) 20 MG tablet, TAKE 1 TABLET BY MOUTH ONCE EVERY NIGHT AT BEDTIME, Disp: 90 tablet, Rfl: 3   Semaglutide,0.25 or 0.'5MG'$ /DOS, (OZEMPIC, 0.25 OR 0.5 MG/DOSE,) 2 MG/3ML SOPN, Inject 0.5 mg into the skin once a week., Disp: 3 mL, Rfl: 1  BP (!) 135/51 (BP Location: Left Arm, Patient Position: Sitting, Cuff Size: Small)   Pulse 66   Temp 98.2 F (36.8 C) (Oral)   Ht '5\' 2"'$  (1.575 m)   Wt 166 lb 8 oz (75.5 kg)   SpO2 93%   BMI 30.45 kg/m   Exam; Nicely  dressed and groomed, always looks put together.  R paracervical and trapezius spasm extends down R paralumbar area - fullness visible, tender to palpation.  Mild limitation of neck ROM. Lungs with diffuse rales, non labored, no JVD.  Heart RRR, mid systo murm Strong radials, absent dorsalis pedis pulses, feet pink and warm with distal purple toes, delayed cap refill.  Skin intact.  No LE edema.    Assessment and Plan:    [name] continues to struggle with chronic conditions, the most symptomatic of which is chronic pain which currently also includes paracervical and paralumbar spine spasms despite a multimodality regimen (gabapentin, lidocaine patches, duloxetine, hydromorphone).  She was disappointed to learn that there were no particular interventions available to improve her LE PAD and she would like a second opinion from a surgeon (referral placed).  She continues to experience iron deficiency despite a recent IV iron infusion.  Her T2DM remains controlled (Xultophy no longer covered, will replace with insulin decludec and with ozemipic) and HTN less so (on metoprolol and lasix - will add lisinopril next).   Unfortunately, smoking continues, which will contribute to ongoing vascular complications.  Her care remains complicated, involving numerous specialists  and significant polypharmacy.  Chronic congestive heart failure with left ventricular diastolic dysfunction (HCC) Heart failure is currently compensated on Lasix 80 mg in the morning 40 mg in the evening.  Renal function remains stable.  Monitor.  Essential hypertension Suboptimal control, currently on metoprolol and Lasix.  She has been on ACE inhibitor in the past, which was discontinued as BP has been controlled without it.  I will discuss with her reinitiating this.  Ideally I would look for a combination pill given her pre-existing polypharmacy.  PAF (paroxysmal atrial fibrillation) (HCC) NSR today by exam.  Anticoagulated with Coumadin, rate controlled with metoprolol.  Monitor.  Peripheral arterial disease [facility]) Ms. Geerts desires a second opinion regarding any role for an endovascular procedure.  It has been difficult to discern the etiology of her leg pain, as it is multifactorial- diabetic neuropathy, low back pain with radiculopathy, and claudication.  Her feet are pulseless and cold, toes are chronically cyanotic.  Risk factors include ongoing smoking (she is not yet ready to stop), well-controlled diabetes, suboptimally controlled hypertension (we will be adding lisinopril), and hyperlipidemia managed with Crestor.  Referral placed to [facility] vein and vascular.  COPD mixed type [facility]) She continues to follow-up with Dr. Annamaria Boots, pulmonologist.  Symptoms are well controlled on current regimen.  Monitor.  Type 2 diabetes mellitus with diabetic neuropathy ([place]) The patient is currently using Continuous Glucose Monitoring.  A1c is well controlled, though her insurance will no longer be covering Xultophy, which will be replaced with insulin degludec and with Ozempic.  Osteoporosis DEXA is ordered.  Chronic iron deficiency anemia Symptomatic (PICA) iron deficiency persists despite recent IV iron infusion.  Iron at this visit 38, iron saturation 9, TIBC 401, ferritin not evaluated (was  12 in 12/2020).  She has a long history of iron deficiency anemia and occult GI bleeds.  Plan repeat iron infusion.  Tobacco abuse Not discussed at this visit as she remains precontemplative.  Fibromyalgia See last entry.  She is experiencing significant muscle spasms from paracervical to lower lumbar areas, though muscle relaxant would be high risk in combination with her other medications and existing predisposition to falls.  Duloxetine and gabapentin are maximized.  This is a difficult situation.  She is tired of being in pain.  She inquired again about  the medication we  discussed at last visit (buprenorphine) so that she can further investigate.  Another option would be referral to a pain clinic though the addition of another specialist in her care could increase the level of complexity.  Chronic low back pain See entry for fibromyalgia.  F/u DEXA Refer to vein and vascular, second opinion re PAD Change xultophy to decludec/Ozemipic Repeat IV iron infusion She will read about buprenorphine

## 2021-05-03 ENCOUNTER — Other Ambulatory Visit (HOSPITAL_COMMUNITY): Payer: Self-pay

## 2021-05-03 ENCOUNTER — Telehealth: Payer: Self-pay

## 2021-05-03 NOTE — Telephone Encounter (Signed)
RTC to patient.  Msg left that Clinics had returned her call. ?

## 2021-05-03 NOTE — Telephone Encounter (Signed)
RTC to patient states she spoke with Dr. [name] about a spasm type pain she was having.  Wants to know what Dr. [name] would like for her to do about the pain when it returns. ?

## 2021-05-03 NOTE — Telephone Encounter (Signed)
Please call pt back about pain.  ?

## 2021-05-04 LAB — BMP8+ANION GAP
Anion Gap: 12 mmol/L (ref 10.0–18.0)
BUN/Creatinine Ratio: 17 (ref 12–28)
BUN: 18 mg/dL (ref 8–27)
CO2: 26 mmol/L (ref 20–29)
Calcium: 9.5 mg/dL (ref 8.7–10.3)
Chloride: 100 mmol/L (ref 96–106)
Creatinine, Ser: 1.04 mg/dL — ABNORMAL HIGH (ref 0.57–1.00)
Glucose: 165 mg/dL — ABNORMAL HIGH (ref 70–99)
Potassium: 4.6 mmol/L (ref 3.5–5.2)
Sodium: 138 mmol/L (ref 134–144)
eGFR: 57 mL/min/{1.73_m2} — ABNORMAL LOW (ref >59)

## 2021-05-04 LAB — CBC
Hematocrit: 32.5 % — ABNORMAL LOW (ref 34.0–46.6)
Hemoglobin: 10.2 g/dL — ABNORMAL LOW (ref 11.1–15.9)
MCH: 25.2 pg — ABNORMAL LOW (ref 26.6–33.0)
MCHC: 31.4 g/dL — ABNORMAL LOW (ref 31.5–35.7)
MCV: 80 fL (ref 79–97)
Platelets: 243 10*3/uL (ref 150–450)
RBC: 4.04 x10E6/uL (ref 3.77–5.28)
RDW: 18.3 % — ABNORMAL HIGH (ref 11.7–15.4)
WBC: 7.3 10*3/uL (ref 3.4–10.8)

## 2021-05-04 LAB — IRON AND TIBC
Iron Saturation: 9 % — CL (ref 15–55)
Iron: 38 ug/dL (ref 27–139)
Total Iron Binding Capacity: 401 ug/dL (ref 250–450)
UIBC: 363 ug/dL (ref 118–369)

## 2021-05-06 ENCOUNTER — Other Ambulatory Visit: Payer: Self-pay | Admitting: *Deleted

## 2021-05-06 ENCOUNTER — Telehealth: Payer: Self-pay | Admitting: Pharmacist

## 2021-05-06 ENCOUNTER — Other Ambulatory Visit (HOSPITAL_COMMUNITY): Payer: Self-pay

## 2021-05-06 DIAGNOSIS — B372 Candidiasis of skin and nail: Secondary | ICD-10-CM

## 2021-05-06 DIAGNOSIS — E7841 Elevated Lipoprotein(a): Secondary | ICD-10-CM

## 2021-05-06 NOTE — Telephone Encounter (Signed)
I was provided the patient's self--testing, finger-stick, point-of-care INR result that she performed at home this morning. INR = 1.8 (target 1.5 - 2.5) She was advised to [place], i.e., 5m M/2.554mTu/We/Sa/Su; 1.2559mn Fri.Repeat INR 1wk. ?

## 2021-05-07 ENCOUNTER — Other Ambulatory Visit: Payer: Self-pay

## 2021-05-07 DIAGNOSIS — F419 Anxiety disorder, unspecified: Secondary | ICD-10-CM

## 2021-05-09 NOTE — Telephone Encounter (Signed)
Pt is calling again I about  the neck pain that she is having  ?

## 2021-05-13 ENCOUNTER — Telehealth: Payer: Self-pay | Admitting: Pharmacist

## 2021-05-13 DIAGNOSIS — Z7901 Long term (current) use of anticoagulants: Secondary | ICD-10-CM | POA: Diagnosis not present

## 2021-05-13 DIAGNOSIS — I480 Paroxysmal atrial fibrillation: Secondary | ICD-10-CM | POA: Diagnosis not present

## 2021-05-13 NOTE — Telephone Encounter (Signed)
Patient reports today's FS POC PST INR = 1.9 (target 1.5 - 2.5) on 50m M/2.57mTu, Wed, Thu, Sat & Sunday; 1/2 x 2.5 (1.2522mon Friday. Instructed to CONTINUE this regimen and repeat PST FS POC INR Monday 10-APR-23. No bleeding reported.  ?

## 2021-05-14 ENCOUNTER — Other Ambulatory Visit: Payer: Self-pay | Admitting: Internal Medicine

## 2021-05-14 ENCOUNTER — Other Ambulatory Visit (HOSPITAL_COMMUNITY): Payer: Self-pay

## 2021-05-14 ENCOUNTER — Telehealth: Payer: Self-pay | Admitting: Dietician

## 2021-05-14 DIAGNOSIS — B372 Candidiasis of skin and nail: Secondary | ICD-10-CM

## 2021-05-14 DIAGNOSIS — E1140 Type 2 diabetes mellitus with diabetic neuropathy, unspecified: Secondary | ICD-10-CM

## 2021-05-14 DIAGNOSIS — E119 Type 2 diabetes mellitus without complications: Secondary | ICD-10-CM | POA: Diagnosis not present

## 2021-05-14 DIAGNOSIS — B379 Candidiasis, unspecified: Secondary | ICD-10-CM | POA: Diagnosis not present

## 2021-05-14 DIAGNOSIS — Z794 Long term (current) use of insulin: Secondary | ICD-10-CM | POA: Diagnosis not present

## 2021-05-14 MED ORDER — ROSUVASTATIN CALCIUM 20 MG PO TABS
ORAL_TABLET | ORAL | 3 refills | Status: DC
  Filled 2021-05-14: qty 90, 90d supply, fill #0
  Filled 2021-08-10: qty 90, 90d supply, fill #1
  Filled 2021-09-07 – 2021-11-05 (×2): qty 90, 90d supply, fill #2
  Filled 2021-12-22 – 2022-02-11 (×2): qty 90, 90d supply, fill #3

## 2021-05-14 MED ORDER — NYSTATIN 100000 UNIT/GM EX POWD
1.0000 "application " | Freq: Three times a day (TID) | CUTANEOUS | 4 refills | Status: DC
  Filled 2021-05-14: qty 60, 20d supply, fill #0
  Filled 2021-08-01: qty 60, 20d supply, fill #1
  Filled 2021-08-20: qty 60, 20d supply, fill #2

## 2021-05-14 MED ORDER — TRESIBA FLEXTOUCH 100 UNIT/ML ~~LOC~~ SOPN
42.0000 [IU] | PEN_INJECTOR | Freq: Every day | SUBCUTANEOUS | 5 refills | Status: DC
  Filled 2021-05-14: qty 12, 28d supply, fill #0
  Filled 2021-06-15: qty 12, 28d supply, fill #1

## 2021-05-14 MED ORDER — KETOCONAZOLE 2 % EX CREA
TOPICAL_CREAM | CUTANEOUS | 0 refills | Status: DC
Start: 2021-05-14 — End: 2021-07-16
  Filled 2021-05-14: qty 30, 25d supply, fill #0

## 2021-05-14 MED ORDER — ALPRAZOLAM 1 MG PO TABS
ORAL_TABLET | ORAL | 0 refills | Status: DC
  Filled 2021-05-14: qty 45, 30d supply, fill #0

## 2021-05-14 MED ORDER — OZEMPIC (0.25 OR 0.5 MG/DOSE) 2 MG/3ML ~~LOC~~ SOPN
0.5000 mg | PEN_INJECTOR | SUBCUTANEOUS | 1 refills | Status: DC
  Filled 2021-05-14 – 2021-05-15 (×2): qty 3, 28d supply, fill #0
  Filled 2021-06-17: qty 3, 28d supply, fill #1

## 2021-05-14 NOTE — Telephone Encounter (Signed)
Patient notified of medicine change and to call with questions or concerns. ?

## 2021-05-14 NOTE — Telephone Encounter (Signed)
Concerned she is going to run out of diabetes medicine.  ?Brought in letter from [employer] about not covering xultophy, down to 2 pens now.  ?Told her I would discuss with triage nurse and Dr. [name]. If a prior authorization is not indicated, recommend [place] same dose as Xultophy daily and Ozempic weekly, can start with 0.5 mg -1 mg weekly ? ?Also says she needs an appointment with Dr. [name]. Route to front office.  ?

## 2021-05-14 NOTE — Telephone Encounter (Signed)
Sure thing!

## 2021-05-15 ENCOUNTER — Ambulatory Visit: Payer: HMO | Admitting: Podiatry

## 2021-05-15 ENCOUNTER — Telehealth: Payer: Self-pay

## 2021-05-15 ENCOUNTER — Other Ambulatory Visit (HOSPITAL_COMMUNITY): Payer: Self-pay

## 2021-05-15 DIAGNOSIS — L989 Disorder of the skin and subcutaneous tissue, unspecified: Secondary | ICD-10-CM | POA: Diagnosis not present

## 2021-05-15 DIAGNOSIS — M79675 Pain in left toe(s): Secondary | ICD-10-CM

## 2021-05-15 DIAGNOSIS — I739 Peripheral vascular disease, unspecified: Secondary | ICD-10-CM

## 2021-05-15 DIAGNOSIS — B351 Tinea unguium: Secondary | ICD-10-CM

## 2021-05-15 DIAGNOSIS — M79674 Pain in right toe(s): Secondary | ICD-10-CM | POA: Diagnosis not present

## 2021-05-15 NOTE — Telephone Encounter (Signed)
Pa  for pt [facility] )  came through on cover my meds was submitted  with office notes and labs .. awaiting approval or denial  ?

## 2021-05-20 ENCOUNTER — Telehealth: Payer: Self-pay | Admitting: Pharmacist

## 2021-05-20 ENCOUNTER — Other Ambulatory Visit: Payer: Self-pay | Admitting: *Deleted

## 2021-05-20 DIAGNOSIS — Z122 Encounter for screening for malignant neoplasm of respiratory organs: Secondary | ICD-10-CM

## 2021-05-20 DIAGNOSIS — Z87891 Personal history of nicotine dependence: Secondary | ICD-10-CM

## 2021-05-20 DIAGNOSIS — F17210 Nicotine dependence, cigarettes, uncomplicated: Secondary | ICD-10-CM

## 2021-05-20 NOTE — Telephone Encounter (Signed)
Patient reports results of PST FS POC INR = 2.3 (target 1.5 - 2.5). Will continue same regimen: 76m on Mon/2.512mTu/We/Th/Sa/Su; 1.2561m1/2 x 2.5mg18mblet) on Friday. Repeat INR 17APR23. ?

## 2021-05-21 ENCOUNTER — Other Ambulatory Visit (HOSPITAL_COMMUNITY): Payer: Self-pay

## 2021-05-21 ENCOUNTER — Other Ambulatory Visit: Payer: Self-pay | Admitting: Internal Medicine

## 2021-05-21 DIAGNOSIS — G8929 Other chronic pain: Secondary | ICD-10-CM

## 2021-05-21 MED ORDER — HYDROCODONE-ACETAMINOPHEN 5-325 MG PO TABS
2.0000 | ORAL_TABLET | Freq: Three times a day (TID) | ORAL | 0 refills | Status: DC
  Filled 2021-05-21: qty 180, 30d supply, fill #0

## 2021-05-21 NOTE — Telephone Encounter (Signed)
Refill Request- Pt requesting to use the [facility] because she does not have anyone to pick up her medication and they will deliver her medication. ? ?HYDROcodone-acetaminophen (NORCO/VICODIN) 5-325 MG tablet ? ? ?

## 2021-05-21 NOTE — Telephone Encounter (Signed)
DECISION :  ? ? Approved  ?  ?Coverage start date : 05/16/2021 ? ? Coverage end date :05/17/2022 ? ? ? ? ? ? ( [place] )  ? ?

## 2021-05-23 ENCOUNTER — Telehealth: Payer: Self-pay | Admitting: Dietician

## 2021-05-23 NOTE — Telephone Encounter (Signed)
Wanted to know if she can take [place] and Ozempic on the same day. Answered her questions.  ?

## 2021-05-27 ENCOUNTER — Telehealth: Payer: Self-pay | Admitting: Pharmacist

## 2021-05-27 ENCOUNTER — Ambulatory Visit: Payer: HMO | Admitting: Behavioral Health

## 2021-05-27 DIAGNOSIS — F419 Anxiety disorder, unspecified: Secondary | ICD-10-CM

## 2021-05-27 DIAGNOSIS — F331 Major depressive disorder, recurrent, moderate: Secondary | ICD-10-CM

## 2021-05-27 NOTE — Assessment & Plan Note (Signed)
Symptomatic (PICA) iron deficiency persists despite recent IV iron infusion.  Iron at this visit 38, iron saturation 9, TIBC 401, ferritin not evaluated (was 12 in 12/2020).  She has a long history of iron deficiency anemia and occult GI bleeds.  Plan repeat iron infusion. ?

## 2021-05-27 NOTE — Assessment & Plan Note (Signed)
NSR today by exam.  Anticoagulated with Coumadin, rate controlled with metoprolol.  Monitor. ?

## 2021-05-27 NOTE — Assessment & Plan Note (Signed)
The patient is currently using Continuous Glucose Monitoring.  A1c is well controlled, though her insurance will no longer be covering Xultophy, which will be replaced with insulin degludec and with Ozempic. ?

## 2021-05-27 NOTE — Assessment & Plan Note (Signed)
See entry for fibromyalgia. ?

## 2021-05-27 NOTE — BH Specialist Note (Signed)
Integrated Behavioral Health via Telemedicine Visit ? ?05/27/2021 ?[name] ?[id number] ? ?Number of [place] Clinician visits: 18 ?Session Start time: 1100 ?Session End time: 1130 ?Total time in minutes: 30 min ? ?Referring Provider: Dr. Lenise Herald, MD ?Patient/Family location: Pt is home in private ?[facility] Provider location: [facility] Office ?All persons participating in visit: Pt & Clinician ?Types of Service: Individual psychotherapy ? ?I connected with [name] and/or Evans Lance Panther's  self  via  Telephone or Geologist, engineering  (Video is Caregility application) and verified that I am speaking with the correct person using two identifiers. Discussed confidentiality: Yes  ? ?I discussed the limitations of telemedicine and the availability of in person appointments.  Discussed there is a possibility of technology failure and discussed alternative modes of communication if that failure occurs. ? ?I discussed that engaging in this telemedicine visit, they consent to the provision of behavioral healthcare and the services will be billed under their insurance. ? ?Patient and/or legal guardian expressed understanding and consented to Telemedicine visit: Yes  ? ?Presenting Concerns: ?Patient and/or family reports the following symptoms/concerns: elevated anx/dep due to state of her Family & her regrets about raising her children ?Duration of problem: years; Severity of problem: moderate ? ?Patient and/or Family's Strengths/Protective Factors: ?Concrete supports in place (healthy food, safe environments, etc.), Sense of purpose, Physical Health (exercise, healthy diet, medication compliance, etc.), and Caregiver has knowledge of parenting & child development ? ?Goals Addressed: ?Patient will: ? Reduce symptoms of: anxiety and depression  ? Increase knowledge and/or ability of: coping skills, stress reduction, and reconciliation of past dec-mkg w/for  her children   ? Demonstrate ability to: Increase healthy adjustment to current life circumstances and Begin healthy grieving over loss ? ?Progress towards Goals: ?Ongoing ? ?Interventions: ?Interventions utilized:  Supportive Counseling ?Standardized Assessments completed:  screeners prn ? ?Patient and/or Family Response: Pt receptive to call today & requests future appts ? ?Assessment: ?Patient currently experiencing elevated distress due to a "fall-out" w/her Str. This situation blew up due to Pt's anger. Pt is disappointed her Str has been inconsistent w/her assistance taking her trashcans out ea week. This erupted into a big argument & the two of them avoiding ea other.  ? ?Pt has trouble trying to be flexible w/life situations that are continually changing. Her Str does not seem willing to help lately. ? ?Patient may benefit from cont'd Cslg. ? ?Plan: ?Follow up with behavioral health clinician on : one month out for 30 min on telehealth ?Behavioral recommendations: Cont to reach out to others for help ?Referral(s): [place] (In Clinic) ? ?I discussed the assessment and treatment plan with the patient and/or parent/guardian. They were provided an opportunity to ask questions and all were answered. They agreed with the plan and demonstrated an understanding of the instructions. ?  ?They were advised to call back or seek an in-person evaluation if the symptoms worsen or if the condition fails to improve as anticipated. ? ?Donnetta Hutching, LMFT ?

## 2021-05-27 NOTE — Assessment & Plan Note (Signed)
Heart failure is currently compensated on Lasix 80 mg in the morning 40 mg in the evening.  Renal function remains stable.  Monitor. ?

## 2021-05-27 NOTE — Assessment & Plan Note (Signed)
>>  ASSESSMENT AND PLAN FOR CHRONIC CHF (CONGESTIVE HEART FAILURE) (HCC) WRITTEN ON 05/27/2021  5:01 PM BY Miguel Aschoff, MD  Heart failure is currently compensated on Lasix 80 mg in the morning 40 mg in the evening.  Renal function remains stable.  Monitor.

## 2021-05-27 NOTE — Assessment & Plan Note (Signed)
Not discussed at this visit as she remains precontemplative. ?

## 2021-05-27 NOTE — Assessment & Plan Note (Signed)
DEXA is ordered. ?

## 2021-05-27 NOTE — Assessment & Plan Note (Signed)
She continues to follow-up with Dr. Annamaria Boots, pulmonologist.  Symptoms are well controlled on current regimen.  Monitor. ?

## 2021-05-27 NOTE — Telephone Encounter (Signed)
INTERNAL MEDICINE TEACHING ATTENDING ADDENDUM - [name] M.D  ?Duration- indefinite, Indication- pAfib, INR- therapeutic. Agree with pharmacy recommendations as outlined in their note.  ? ? ? ?

## 2021-05-27 NOTE — Assessment & Plan Note (Signed)
Suboptimal control, currently on metoprolol and Lasix.  She has been on ACE inhibitor in the past, which was discontinued as BP has been controlled without it.  I will discuss with her reinitiating this.  Ideally I would look for a combination pill given her pre-existing polypharmacy. ?

## 2021-05-27 NOTE — Telephone Encounter (Signed)
Patient reports value of PST FS POC INR today = 2.1 on a regimen of warfarin Su-2.28mM-0mgT-2.5mgW-2.5mgTh-2.5mgF-1.25mgSa-2.5mg. Target INR range 1.5 - 2.5. Will CONTINUE same regimen. No bleeding reported.  ?

## 2021-05-27 NOTE — Assessment & Plan Note (Signed)
[name] desires a second opinion regarding any role for an endovascular procedure.  It has been difficult to discern the etiology of her leg pain, as it is multifactorial- diabetic neuropathy, low back pain with radiculopathy, and claudication.  Her feet are pulseless and cold, toes are chronically cyanotic.  Risk factors include ongoing smoking (she is not yet ready to stop), well-controlled diabetes, suboptimally controlled hypertension (we will be adding lisinopril), and hyperlipidemia managed with Crestor.  Referral placed to [facility] vein and vascular. ?

## 2021-05-27 NOTE — Assessment & Plan Note (Signed)
See last entry.  She is experiencing significant muscle spasms from paracervical to lower lumbar areas, though muscle relaxant would be high risk in combination with her other medications and existing predisposition to falls.  Duloxetine and gabapentin are maximized.  This is a difficult situation.  She is tired of being in pain.  She inquired again about the medication we  discussed at last visit (buprenorphine) so that she can further investigate.  Another option would be referral to a pain clinic though the addition of another specialist in her care could increase the level of complexity. ?

## 2021-05-29 ENCOUNTER — Other Ambulatory Visit (HOSPITAL_COMMUNITY): Payer: Self-pay

## 2021-05-29 ENCOUNTER — Telehealth: Payer: Self-pay | Admitting: Dietician

## 2021-05-29 DIAGNOSIS — E1140 Type 2 diabetes mellitus with diabetic neuropathy, unspecified: Secondary | ICD-10-CM

## 2021-05-29 MED ORDER — ONETOUCH VERIO REFLECT W/DEVICE KIT
PACK | 1 refills | Status: DC
  Filled 2021-05-29: qty 1, 1d supply, fill #0
  Filled 2021-12-15 – 2021-12-26 (×2): qty 1, 1d supply, fill #1

## 2021-05-29 NOTE — Progress Notes (Addendum)
? ?SUBJECTIVE ?Patient presents to office today complaining of elongated, thickened nails that cause pain while ambulating in shoes.  She is unable to trim her own nails.  Patient is on Coumadin anticoagulant for peripheral vascular disease being managed by Dorian Pod MD.  Patient states that she does have blockages in her bilateral lower extremities.  Patient is here for further evaluation and treatment. ? ?Past Medical History:  ?Diagnosis Date  ? Abnormality of lung on CXR 02/02/2020  ? Nonspecific finding on CXR ordered by pulmonologist - c/w inflammation vs infection, f/u imaging suggested, Dr. [name] office has been in communication about recommended next steps.  ? Anxiety 07/24/2010  ? Aortic atherosclerosis ([place]) 10/19/2014  ? Seen on CT scan, currently asymptomatic  ? Arteriovenous malformation of gastrointestinal tract 08/08/2015  ? Non-bleeding when visualized on capsule endoscopy 06/30/2015   ? Arthritis   ? "lower back; hands" (02/19/2018)  ? Asthma   ? Asymptomatic cholelithiasis 09/25/2015  ? Seen on CT scan 08/2015  ? Carotid artery stenosis; s/p R endarterectomy   ? s/p right endarterectomy (06/2010) Carotid US (07/2010):  Left: Moderate-to-severe (60-79%) calcific and non-calcific plaque origin and proximal ICA and ECA   ? Chronic congestive heart failure with left ventricular diastolic dysfunction ([place]) 10/21/2010  ? Chronic constipation 02/03/2011  ? Chronic daily headache 01/16/2014  ? Chronic iron deficiency anemia   ? Chronic low back pain 10/06/2012  ? Chronic venous insufficiency 08/04/2012  ? COPD (chronic obstructive pulmonary disease) with emphysema ([place])   ? PFTs 2018: severe obstructive disease, insignif response to bronchiodilator, mild restriction parenchymal pattern, moderately severe diffusion defect. 2014  FEV1 0.92 (40%), ratio 69, 27% increase in FEV1 with BD, TLC 91%, severe airtrapping, DLCO49% On chronic home O2. Pulmonary rehab referral 05/2012   ? Dyspnea   ? Fibromyalgia  08/29/2010  ? Gastroesophageal reflux disease   ? History of blood transfusion   ? "several times"  (02/19/2018)  ? History of clear cell renal cell carcinoma ([place]), in remission 07/21/2011  ? s/p cryoablation of left RCC in 09/2011 by [name]. Followed by [name]  [facility]) .    ? History of hiatal hernia   ? History of mitral valve replacement with bioprosthetic valve due to mitral stenosis 2012  ? s/p MVR with a 27-mm pericardial porcine valve (Medtronic Mosaic valve, serial #[id number] on 09/20/10, Dr. Prescott Gum)   ? History of obstructive sleep apnea, resolved 2013  ? resolved per sleep study 07/2019; no apnea, but did have desaturation.  CPAP no longer necessary.  Nocturnal polysomnography (06/2009): Moderate sleep apnea/ hypopnea syndrome , AHI 17.8 per hour with nonpositional hypopneas. CPAP titration to 12 CWP, AHI 2.4 per hour. On nocturnal CPAP via a small resMed Quattro full-face mask with heated humidifier.   ? History of pneumonia   ? "once"  (02/19/2018)  ? History of seborrheic keratosis 09/28/2015  ? Hyperlipidemia LDL goal < 100 11/20/2005  ? Internal and external hemorrhoids without complication 35/70/1779  ? Lesion of left native kidney 06/01/2020  ? Incidental finding on recent screening chest CT for lung ca ordered by Dr. Annamaria Boots pulmonology - he has ordered a f/u renal U/S.   ? Lichen sclerosus of female genitalia 01/12/2017  ? Migraine   ? "none in years" (02/19/2018)  ? Moderately severe major depression ([place]) 11/19/2005  ? Nocturnal hypoxia per sleep study 07/2019   ? Osteoporosis   ? DEXA 2016: T -2.7; DEXA (12/09/2011): L-spine T -3.7, left hip T -  1.4 DEXA (12/2004): L-spine T -2.6, left hip -0.1   ? Paroxysmal atrial fibrillation ([place]) 10/22/2010  ? s/p Left atrial maze procedure for paroxysmal atrial fibrillation on 09/20/2010 by Dr Prescott Gum.  Subsequent splenic infarct, decision was made to re-anticoagulate with coumadin, likely life-long as this is the most likely cause of  the splenic infarct.   ? Personal history of colonic polyps 05/14/2011  ? Colonoscopy (05/2011): 4 mm adenomatous polyp excised endoscopically Colonoscopy (02/2002): Adenomatous polyp excised endoscopically   ? Personal history of renal cell carcinoma 09/12/2020  ? Pneumonia   ? Pulmonary hypertension due to chronic obstructive pulmonary disease ([place]) 04/25/2016  ? 2014 TEE w PA peak pressure 46 mmHg, s/p MV replacement   ? Right nephrolithiasis, asymptomatic, incidental finding 09/06/2014  ? 5 mm non-obstructing calculus seen on CT scan 09/05/2014   ? Right ventricular failure ([place]) 04/25/2016  ? Severe obesity (BMI 35.0-39.9) with comorbidity ([place]) 10/23/2011  ? Sleep apnea   ? Tobacco abuse 07/28/2012  ? Type 2 diabetes mellitus with diabetic neuropathy ([facility])   ? ? ?OBJECTIVE ?General Patient is awake, alert, and oriented x 3 and in no acute distress. ?Derm Skin is dry and supple bilateral. Negative open lesions or macerations. Remaining integument unremarkable. Nails are tender, long, thickened and dystrophic with subungual debris, consistent with onychomycosis, 1-5 bilateral. No signs of infection noted.  Hyperkeratotic preulcerative callus lesions also noted to the tips of the toes bilateral.  There are also symptomatic hyperkeratotic calluses noted to the subfirst MTPJ of the bilateral feet. ?Vasc  DP and PT pedal pulses diminished bilaterally. Temperature gradient within normal limits.  Delayed capillary refill bilateral ?Neuro Epicritic and protective threshold sensation grossly intact bilaterally.  ?Musculoskeletal Exam No symptomatic pedal deformities noted bilateral. Muscular strength within normal limits. ? ?ASSESSMENT ?1. Onychodystrophic nails 1-5 bilateral with hyperkeratosis of nails.  ?2. Onychomycosis of nail due to dermatophyte bilateral ?3. Pain in foot bilateral ?4.  Preulcerative callus lesions bilateral feet ?5.  Peripheral vascular disease ? ?PLAN OF CARE ?1. Patient evaluated today.  ?2.  Instructed to maintain good pedal hygiene and foot care.  ?3. Mechanical debridement of nails 1-5 bilaterally performed using a nail nipper. Filed with dremel without incident.  ?4.  Excisional debridement of the hyperkeratotic calluses was performed using a chisel blade without incident or bleeding  ?5.  Patient states that she has been wearing house slippers around the house and has not been going barefoot ?6.  Hydrating foot lotion was provided for the patient to apply daily  ?7.  Return to clinic in 3 mos.  ? ? ?Edrick Kins, DPM ?[place] ? ?Dr. Edrick Kins, DPM  ?  ?[address]                                      ?[place], [place] [address]                ?Office [phone]  ?Fax [phone] ? ? ? ? ? ?

## 2021-05-29 NOTE — Telephone Encounter (Signed)
Had hypoglycemia yesterday twice; first was when sensor fell off which she had no symptoms, the second was due to more activity than usual when she was busy house cleaning and again she had no symptoms and ignored the alarm. She treated appropriately and it resolved. She requests a blood sugar meter to test her blood sugar when instructed to do so by CGM. She states she started new diabetes medicines on Monday and the semaglutide is decreasing her appetite more than the Xultophy was. She was educated to please call if continued hypoglycemia.  ?

## 2021-05-30 ENCOUNTER — Other Ambulatory Visit (HOSPITAL_COMMUNITY): Payer: Self-pay

## 2021-05-31 ENCOUNTER — Telehealth: Payer: Self-pay | Admitting: Acute Care

## 2021-05-31 ENCOUNTER — Encounter: Payer: HMO | Admitting: Acute Care

## 2021-05-31 NOTE — Telephone Encounter (Signed)
Contacted patient and rescheduled appointments ?

## 2021-06-03 ENCOUNTER — Telehealth: Payer: Self-pay | Admitting: Podiatry

## 2021-06-03 ENCOUNTER — Telehealth: Payer: Self-pay | Admitting: Pharmacist

## 2021-06-03 NOTE — Telephone Encounter (Signed)
Patient provides results of PST, FS, POC INR = 2.1. on 13.48m warfarin/wk as:  Su-2.571m-0mgT-2.5mgW-2.5mgTh-2.5mgF-1.25mgSa-2.5mg. CONTINUE SAME regimen. Denies any symptoms of bleeding.  ?

## 2021-06-03 NOTE — Telephone Encounter (Signed)
Note corrected. Thanks, Dr. Amalia Hailey

## 2021-06-03 NOTE — Telephone Encounter (Signed)
Pt called and had concerns about information that was in her mychart. She had concerns with the statement in Dr. Amalia Hailey last office visit note 05/15/21: ? ?"Patient is on Eliquis anticoagulant for peripheral vascular disease being managed by Dr. Quay Burow"  ? ?Pt states she does not take Eliquis but she does take Coumadin and its being managed by Dorian Pod MD.  ? ?Pt states she no longer sees Dr. Gwenlyn Found for her care and will not go back to see him.  ? ?Please advise if information can be changed in chart. ?

## 2021-06-04 ENCOUNTER — Encounter: Payer: Self-pay | Admitting: Acute Care

## 2021-06-04 ENCOUNTER — Ambulatory Visit (INDEPENDENT_AMBULATORY_CARE_PROVIDER_SITE_OTHER): Payer: HMO | Admitting: Acute Care

## 2021-06-04 ENCOUNTER — Other Ambulatory Visit: Payer: HMO

## 2021-06-04 DIAGNOSIS — F17210 Nicotine dependence, cigarettes, uncomplicated: Secondary | ICD-10-CM

## 2021-06-04 NOTE — Patient Instructions (Signed)
Thank you for participating in the [facility]. It was our pleasure to meet you today. We will call you with the results of your scan within the next few days. Your scan will be assigned a Lung RADS category score by the physicians reading the scans.  This Lung RADS score determines follow up scanning.  See below for description of categories, and follow up screening recommendations. We will be in touch to schedule your follow up screening annually or based on recommendations of our providers. We will fax a copy of your scan results to your Primary Care Physician, or the physician who referred you to the program, to ensure they have the results. Please call the office if you have any questions or concerns regarding your scanning experience or results.  Our office number is [phone]. Please speak with Denise Phelps, RN. , or  Denise Buckner RN, They are  our Lung Cancer Screening RN.'s If They are unavailable when you call, Please leave a message on the voice mail. We will return your call at our earliest convenience.This voice mail is monitored several times a day.  Remember, if your scan is normal, we will scan you annually as long as you continue to meet the criteria for the program. (Age 55-77, Current smoker or smoker who has quit within the last 15 years). If you are a smoker, remember, quitting is the single most powerful action that you can take to decrease your risk of lung cancer and other pulmonary, breathing related problems. We know quitting is hard, and we are here to help.  Please let us know if there is anything we can do to help you meet your goal of quitting. If you are a former smoker, congratulations. We are proud of you! Remain smoke free! Remember you can refer friends or family members through the number above.  We will screen them to make sure they meet criteria for the program. Thank you for helping us take better care of you by  participating in Lung Screening.  You can receive free nicotine replacement therapy ( patches, gum or mints) by calling 1-800-QUIT NOW. Please call so we can get you on the path to becoming  a non-smoker. I know it is hard, but you can do this!  Lung RADS Categories:  Lung RADS 1: no nodules or definitely non-concerning nodules.  Recommendation is for a repeat annual scan in 12 months.  Lung RADS 2:  nodules that are non-concerning in appearance and behavior with a very low likelihood of becoming an active cancer. Recommendation is for a repeat annual scan in 12 months.  Lung RADS 3: nodules that are probably non-concerning , includes nodules with a low likelihood of becoming an active cancer.  Recommendation is for a 6-month repeat screening scan. Often noted after an upper respiratory illness. We will be in touch to make sure you have no questions, and to schedule your 6-month scan.  Lung RADS 4 A: nodules with concerning findings, recommendation is most often for a follow up scan in 3 months or additional testing based on our provider's assessment of the scan. We will be in touch to make sure you have no questions and to schedule the recommended 3 month follow up scan.  Lung RADS 4 B:  indicates findings that are concerning. We will be in touch with you to schedule additional diagnostic testing based on our provider's  assessment of the scan.  Other options for assistance in smoking cessation (   As covered by your insurance benefits)  Hypnosis for smoking cessation  [employer] [phone]  Acupuncture for smoking cessation  [facility] [phone]   

## 2021-06-04 NOTE — Progress Notes (Signed)
Virtual Visit via Telephone Note ? ?I connected with [name] on 06/04/21 at 11:30 AM EDT by telephone and verified that I am speaking with the correct person using two identifiers. ? ?Location: ?Patient:  At home ?Provider: [facility], [place], [place], [address]  ?  ?I discussed the limitations, risks, security and privacy concerns of performing an evaluation and management service by telephone and the availability of in person appointments. I also discussed with the patient that there may be a patient responsible charge related to this service. The patient expressed understanding and agreed to proceed. ? ? ? ?Shared Decision Making Visit Lung Cancer Screening Program ?([phone]) ? ? ?Eligibility: ?Age 71 y.o. ?Pack Years Smoking History Calculation 42 pack year smoking history ?(# packs/per year x # years smoked) ?Recent History of coughing up blood  no ?Unexplained weight loss? no ?( >Than 15 pounds within the last 6 months ) ?Prior History Lung / other cancer no ?(Diagnosis within the last 5 years already requiring surveillance chest CT Scans). ?Smoking Status Current Smoker ?Former Smokers: Years since quit: NA ? Quit Date:  NA ? ?Visit Components: ?Discussion included one or more decision making aids. yes ?Discussion included risk/benefits of screening. yes ?Discussion included potential follow up diagnostic testing for abnormal scans. yes ?Discussion included meaning and risk of over diagnosis. yes ?Discussion included meaning and risk of False Positives. yes ?Discussion included meaning of total radiation exposure. yes ? ?Counseling Included: ?Importance of adherence to annual lung cancer LDCT screening. yes ?Impact of comorbidities on ability to participate in the program. yes ?Ability and willingness to under diagnostic treatment. yes ? ?Smoking Cessation Counseling: ?Current Smokers:  ?Discussed importance of smoking cessation. yes ?Information about tobacco cessation classes and  interventions provided to patient. yes ?Patient provided with "ticket" for LDCT Scan. yes ?Symptomatic Patient. no ? Counseling NA ?Diagnosis Code: Tobacco Use Z72.0 ?Asymptomatic Patient yes ? Counseling (Intermediate counseling: > three minutes counseling) X7262 ?Former Smokers:  ?Discussed the importance of maintaining cigarette abstinence. yes ?Diagnosis Code: Personal History of Nicotine Dependence. M35.597 ?Information about tobacco cessation classes and interventions provided to patient. Yes ?Patient provided with "ticket" for LDCT Scan. yes ?Written Order for Lung Cancer Screening with LDCT placed in Epic. Yes ?(CT Chest Lung Cancer Screening Low Dose W/O CM) [id number] ?Z12.2-Screening of respiratory organs ?Z87.891-Personal history of nicotine dependence ? ?I have spent 25 minutes of face to face/ virtual visit   time with [name] discussing the risks and benefits of lung cancer screening. We viewed / discussed a power point together that explained in detail the above noted topics. We paused at intervals to allow for questions to be asked and answered to ensure understanding.We discussed that the single most powerful action that she can take to decrease her risk of developing lung cancer is to quit smoking. We discussed whether or not she is ready to commit to setting a quit date. We discussed options for tools to aid in quitting smoking including nicotine replacement therapy, non-nicotine medications, support groups, Quit Smart classes, and behavior modification. We discussed that often times setting smaller, more achievable goals, such as eliminating 1 cigarette a day for a week and then 2 cigarettes a day for a week can be helpful in slowly decreasing the number of cigarettes smoked. This allows for a sense of accomplishment as well as providing a clinical benefit. I provided  her  with smoking cessation  information  with contact information for community resources, classes, free nicotine  replacement  therapy, and access to mobile apps, text messaging, and on-line smoking cessation help. I have also provided  her  the office contact information in the event she needs to contact me, or the screening staff. We discussed the time and location of the scan, and that either Doroteo Glassman RN, Joella Prince, RN  or I will call / send a letter with the results within 24-72 hours of receiving them. The patient verbalized understanding of all of  the above and had no further questions upon leaving the office. They have my contact information in the event they have any further questions. ? ?I spent 3 minutes counseling on smoking cessation and the health risks of continued tobacco abuse. ? ?I explained to the patient that there has been a high incidence of coronary artery disease noted on these exams. I explained that this is a non-gated exam therefore degree or severity cannot be determined. This patient is on statin therapy. I have asked the patient to follow-up with their PCP regarding any incidental finding of coronary artery disease and management with diet or medication as their PCP  feels is clinically indicated. The patient verbalized understanding of the above and had no further questions upon completion of the visit. ? ?  ? ? ?Magdalen Spatz, NP ?06/04/2021 ? ? ? ? ? ? ?

## 2021-06-06 ENCOUNTER — Ambulatory Visit (INDEPENDENT_AMBULATORY_CARE_PROVIDER_SITE_OTHER): Payer: HMO | Admitting: Internal Medicine

## 2021-06-06 ENCOUNTER — Other Ambulatory Visit: Payer: Self-pay | Admitting: Internal Medicine

## 2021-06-06 ENCOUNTER — Ambulatory Visit: Payer: Self-pay | Admitting: Internal Medicine

## 2021-06-06 ENCOUNTER — Encounter: Payer: Self-pay | Admitting: Internal Medicine

## 2021-06-06 VITALS — BP 127/37 | HR 58 | Temp 98.2°F | Ht 62.0 in | Wt 165.6 lb

## 2021-06-06 DIAGNOSIS — D500 Iron deficiency anemia secondary to blood loss (chronic): Secondary | ICD-10-CM

## 2021-06-06 DIAGNOSIS — E669 Obesity, unspecified: Secondary | ICD-10-CM

## 2021-06-06 DIAGNOSIS — M810 Age-related osteoporosis without current pathological fracture: Secondary | ICD-10-CM | POA: Diagnosis not present

## 2021-06-06 DIAGNOSIS — E1140 Type 2 diabetes mellitus with diabetic neuropathy, unspecified: Secondary | ICD-10-CM

## 2021-06-06 DIAGNOSIS — Z794 Long term (current) use of insulin: Secondary | ICD-10-CM | POA: Diagnosis not present

## 2021-06-06 DIAGNOSIS — G8929 Other chronic pain: Secondary | ICD-10-CM

## 2021-06-06 DIAGNOSIS — Z86010 Personal history of colonic polyps: Secondary | ICD-10-CM

## 2021-06-06 DIAGNOSIS — M5441 Lumbago with sciatica, right side: Secondary | ICD-10-CM

## 2021-06-06 DIAGNOSIS — I739 Peripheral vascular disease, unspecified: Secondary | ICD-10-CM

## 2021-06-06 DIAGNOSIS — E1151 Type 2 diabetes mellitus with diabetic peripheral angiopathy without gangrene: Secondary | ICD-10-CM

## 2021-06-06 DIAGNOSIS — M797 Fibromyalgia: Secondary | ICD-10-CM

## 2021-06-06 DIAGNOSIS — M19042 Primary osteoarthritis, left hand: Secondary | ICD-10-CM

## 2021-06-06 DIAGNOSIS — M5442 Lumbago with sciatica, left side: Secondary | ICD-10-CM | POA: Diagnosis not present

## 2021-06-06 DIAGNOSIS — B372 Candidiasis of skin and nail: Secondary | ICD-10-CM

## 2021-06-06 DIAGNOSIS — E7841 Elevated Lipoprotein(a): Secondary | ICD-10-CM

## 2021-06-06 DIAGNOSIS — M19041 Primary osteoarthritis, right hand: Secondary | ICD-10-CM

## 2021-06-06 NOTE — Progress Notes (Signed)
Care team: Chronic case management, Milus Height, [facility] embedded in [place] Dr. Theodis Shove [facility] Diabetes coordination Ms. Plyler [facility] Podiatry Dr. Amalia Hailey Pulmonology Dr. Phill Mutter Smoking cessation counselling and lung cancer screening Ms. Elie Confer, NP [place] [facility]tico[facility] Dr. Elie Confer, PharmD [facility] Cardiology Dr. Evelina Bucy Heartfailure Interventional cardiology Dr. Ila Mcgill  Ms. Consuello Closs is here to review her chronic medical problems.  Since her last visit with me on 05/02/2021, she has been evaluated by Dr. Amalia Hailey of podiatry for nail trimming, and had a recent visit with [facility] pulmonology NP [place] for smoking cessation and lung cancer screening.  She will be having her 2nd annual low dose CT.  Started new DM regimen last week - Tresiba 42 units, and Ozempic weekly 0.5.  Spit up after the Xultophy was discontinued.  No pain or nausea, one episode self limited vomiting resolved.  DM in good control.  She will continue to review the suboxone recommendation.  We discussed the possibility of tapering off opioid, as she no longer feels she is getting benefit, though she is fearful of an exacerbation that might occur with lower doses.  She will consider a pain management referral.  We agree that movement (though potentially uncomfortable in the beginning) can lead to improvement in discomfort and functional impairment.  We agree to pursue ambulatory PT.    [facility] NP visits are a problem. She agrees and indicated she would terminate these duplicate and confusing services.   Patient Active Problem List   Diagnosis Date Noted   Polypharmacy 11/08/2020   Personal history of renal cell carcinoma 09/12/2020   Tinea pedis of both feet 08/07/2020   Nocturnal hypoxia per sleep study 07/2019    Chronic iron deficiency anemia    Carotid artery stenosis; s/p R endarterectomy    COPD mixed type ([place])    Arthritis of both hands 07/01/2019   Pain due to onychomycosis of toenails of  both feet 05/27/2019   Benign hypertensive heart and CKD, stage 3 (GFR 30-59), w CHF ([place]) [id number]   Lichen sclerosus of female genitalia 01/12/2017   Mixed stress and urge urinary incontinence 05/27/2016   Pulmonary hypertension due to chronic obstructive pulmonary disease ([place]) 04/25/2016   Asymptomatic cholelithiasis 09/25/2015   Aortic atherosclerosis ([place]) 10/19/2014   Right nephrolithiasis, asymptomatic (incidental finding) 09/06/2014   Chronic low back pain 10/06/2012   Long term current use of anticoagulant therapy 09/30/2012   Chronic venous insufficiency 08/04/2012   Recurrent candidal intertrigo 07/28/2012   Tobacco abuse 07/28/2012   Obesity (BMI 30-39.9) 10/23/2011   Personal history of colonic polyps 05/14/2011   Sleep disturbance 05/14/2011   Abdominal wall hernia 05/14/2011   Health care maintenance 11/04/2010   PAF (paroxysmal atrial fibrillation) ([place]) 10/22/2010   Chronic congestive heart failure with left ventricular diastolic dysfunction ([place]) 10/21/2010   Fibromyalgia 08/29/2010   Gastroesophageal reflux disease    Type 2 diabetes mellitus with diabetic neuropathy (HCC)    Osteoporosis    OSA (obstructive sleep apnea)    Peripheral arterial disease ([place]) 05/27/2010   History of mitral valve replacement with bioprosthetic valve 2012   Essential hypertension 06/19/2008   Hyperlipidemia 11/20/2005   Moderate major depression ([place]) 11/19/2005    Current Outpatient Medications:    ferrous gluconate (FERGON) 324 MG tablet, Take 1 tablet (324 mg total) by mouth daily with breakfast., Disp: 90 tablet, Rfl: 3   ketoconazole (NIZORAL) 2 % cream, Apply to affected areas nightly at bedtime after cleaning area properly,  Disp: 30 g, Rfl: 0   albuterol (PROAIR HFA) 108 (90 Base) MCG/ACT inhaler, Inhale 2 puffs into the lungs every 6 (six) hours as needed for shortness of breath., Disp: 18 g, Rfl: 5   ALPRAZolam (XANAX) 1 MG tablet, TAKE 1 TABLET BY MOUTH EVERY NIGHT AT  BEDTIME AS NEEDED FOR INSOMNIA. MAY TAKE 1/2 TABLET BY MOUTH DURING THE DAY FOR ANXIETY, Disp: 45 tablet, Rfl: 0   benzonatate (TESSALON) 100 MG capsule, Take 1 capsule (100 mg total) by mouth 3 (three) times daily as needed for cough., Disp: 90 capsule, Rfl: 11   Blood Glucose Monitoring Suppl (ONETOUCH VERIO REFLECT) w/Device KIT, Use to check blood sugar 1 time a day, Disp: 1 kit, Rfl: 1   buPROPion (WELLBUTRIN XL) 150 MG 24 hr tablet, Take 1 tablet by mouth every morning, Disp: 90 tablet, Rfl: 3   Continuous Blood Gluc Sensor (FREESTYLE LIBRE 3 SENSOR) MISC, Place 1 sensor on the skin every 14 days. Use to check glucose continuously, Disp: 6 each, Rfl: 3   DULoxetine (CYMBALTA) 60 MG capsule, Take 1 capsule by mouth twice a day., Disp: 180 capsule, Rfl: 3   fluticasone (FLONASE) 50 MCG/ACT nasal spray, Place 2 sprays into both nostrils daily., Disp: 54 g, Rfl: 3   Fluticasone-Umeclidin-Vilant 100-62.5-25 MCG/ACT AEPB, Inhale 1 puff into the lungs daily., Disp: 180 each, Rfl: 4   furosemide (LASIX) 80 MG tablet, Take 1 tablet by mouth every morning AND 1/2 tablet daily in the afternoon., Disp: 135 tablet, Rfl: 3   gabapentin (NEURONTIN) 300 MG capsule, Take 2 capsules by mouth 3 times daily., Disp: 540 capsule, Rfl: 3   HYDROcodone-acetaminophen (NORCO/VICODIN) 5-325 MG tablet, Take 2 tablets by mouth 3 (three) times daily., Disp: 180 tablet, Rfl: 0   insulin degludec (TRESIBA FLEXTOUCH) 100 UNIT/ML FlexTouch Pen, Inject 42 Units into the skin daily., Disp: 12 mL, Rfl: 5   Insulin Pen Needle 32G X 4 MM MISC, Use to inject insulin up to 6 times a day, Disp: 500 each, Rfl: 3   ipratropium-albuterol (DUONEB) 0.5-2.5 (3) MG/3ML SOLN, Take 3 mLs by nebulization every 6 (six) hours as needed (shortness of breath, wheezing)., Disp: 90 mL, Rfl: 3   Lancets Misc. (ACCU-CHEK FASTCLIX LANCET) KIT, Check your blood 4 times a day dx code 250.00 insulin requiring, Disp: 1 kit, Rfl: 2   lidocaine (LIDODERM) 5 %,  Place 1 or 2 patches to painful area of back each day.  Remove & Discard patch within 12 hours or as directed by MD, Disp: 60 patch, Rfl: 2   metoprolol succinate (TOPROL-XL) 50 MG 24 hr tablet, Take 1 tablet by mouth daily. Take with or immediately following a meal., Disp: 90 tablet, Rfl: 3   nystatin powder, Apply topically 3  times daily., Disp: 60 g, Rfl: 4   omeprazole (PRILOSEC) 40 MG capsule, Take 1 capsule by mouth in the morning and at bedtime., Disp: 180 capsule, Rfl: 2   ondansetron (ZOFRAN) 4 MG tablet, Take 1 tablet (4 mg total) by mouth every 8 (eight) hours as needed for nausea or vomiting., Disp: 21 tablet, Rfl: 0   OXYGEN, Inhale 2 L into the lungs at bedtime., Disp: , Rfl:    PRESCRIPTION MEDICATION, Inhale into the lungs at bedtime. CPAP, Disp: , Rfl:    Probiotic Product (ALIGN) 4 MG CAPS, See admin instructions., Disp: , Rfl:    rosuvastatin (CRESTOR) 20 MG tablet, TAKE 1 TABLET BY MOUTH ONCE EVERY NIGHT AT BEDTIME, Disp: 90  tablet, Rfl: 3   Semaglutide,0.25 or 0.'5MG'$ /DOS, (OZEMPIC, 0.25 OR 0.5 MG/DOSE,) 2 MG/3ML SOPN, Inject 0.5 mg into the skin once a week., Disp: 3 mL, Rfl: 1   warfarin (COUMADIN) 2.5 MG tablet, Take 1 tablet by mouth daily unless otherwise instructed., Disp: 90 tablet, Rfl: 3   Wheat Dextrin (BENEFIBER) POWD, See admin instructions., Disp: , Rfl:   BP (!) 127/37 (BP Location: Right Arm, Patient Position: Sitting, Cuff Size: Small)   Pulse (!) 58   Temp 98.2 F (36.8 C) (Oral)   Ht '5\' 2"'$  (1.575 m)   Wt 165 lb 9.6 oz (75.1 kg)   SpO2 97%   BMI 30.29 kg/m  Examined in transport chair.  Nicely dressed and groomed as always. Frustrated mood due to chronically feeling unwell.  Fine hand tremor with intention waxes and wanes throughout visit. Voice is slightly hoarse, especially during laugh (chronic).  LE w/o edema, better color of toes than usual (pink today, usually quite dusky) - warm, no skin wounds or lesions.  Pulseless chronically.  Toenails recently  trimmed.  Lungs coarse breath sounds, no dependent rales, no JVD.  Heart RRR, no significant murmur. Despite iron deficiency, no remarkable pallor.    Assessment and Plan (problems below not ordered by priority): Ms. Carlean Jews Lenon Curt is appropriately concerned about her limited functional status given chronic pain, which is multifactorial (fibromyalgia, arthritis, diabetic peripheral neuropathy, PAD) and worsening, which contributes to immobility which then exacerbates pain in a cycle of further decline.  She will be giving further consideration to a pain management clinic referral versus transition to Suboxone.  Her severe iron deficiency (no observed GI losses; manifested as fatigue) unfortunately did not respond to iron infusion, to which she had an allergic reaction.  We will be looking into whether a repeat iron infusion is safe.  If not, she will need oral iron replacement and would be a candidate for blood transfusion if hemoglobin dropped below 8.  Personal history of colonic polyps F/u colonoscopy is intended (her iron deificiency continues) once her heart doctor has reviewed.   Obesity (BMI 30.0-34.9) Weight has remained stable around BMI 30 for past year.  She continues on semaglutide.  Hyperlipidemia 04/2020: Well-controlled TC 115, HDL 35, LDL 51.  Continue high intensity statin.  Recheck not necessary at this time.  Recurrent candidal intertrigo Under control.  Intertriginous areas of pannus folds with noninflamed  erythema, no open areas, clean and dry.  NP from [facility] had ordered her medication which she feels is effective.  She is aware of importance of daily cleansing, maintaining dry environment.  No benefit of using both cream and powder in the same area at the same time.  Type 2 diabetes mellitus with diabetic neuropathy ([place]) At time of this visit, she had used new regimen for 1 week, tolerated well.  Tresiba 42 units daily, Ozempic 0.5 mg daily replaces Xultophy.  DM  remains in good control.  Osteoporosis DEXA scheduled  Peripheral arterial disease ([place]) See previous entry.  Feet appear well perfused today, they are not cold and color is less cyanotic.  Pulseless, chronic. [name] appt 08/05/21, second opinion for surgical management of PAD. Continue risk factor management (DM, HTN, lipids are controlled, she has lost weight over past couple of years).  Working on smoking cessation.  Referred to PT for exercise program as tolerated.  Trial cilostazol?

## 2021-06-06 NOTE — Patient Instructions (Signed)
[name], ? ?Great to see you today as always.  We discussed many things, but most importantly your chronic pain. ? ?We will be rescheduling another iron transfusion to address your low iron.  It will be a different preparation than last time. ? ?I'm glad you'll be seeing [name] about your circulation. ? ?Your diabetes and blood pressure are looking great. ? ?Let's get you going with a physical therapist.  Edwena Bunde got to get you moving! ? ?See you soon, ? ?Dr. [name] ?

## 2021-06-10 ENCOUNTER — Telehealth: Payer: Self-pay | Admitting: Pharmacist

## 2021-06-10 DIAGNOSIS — I480 Paroxysmal atrial fibrillation: Secondary | ICD-10-CM | POA: Diagnosis not present

## 2021-06-10 DIAGNOSIS — Z7901 Long term (current) use of anticoagulants: Secondary | ICD-10-CM | POA: Diagnosis not present

## 2021-06-10 NOTE — Telephone Encounter (Signed)
Patient provides a PST POC FS INR today of 2.1 (target 1.5 - 2.5). Will CONTINUE SAME REGIMEN of 24m on Mon; 2.577mwarfarin on five days of the week (Tu/We/Th/Sa/Su) and 1/2 x 2.5 mg (1.2563mose) on Friday. Repeat PST FS POC INR in one week, 8-MAY-23. ?

## 2021-06-10 NOTE — Telephone Encounter (Signed)
Agree 

## 2021-06-11 ENCOUNTER — Ambulatory Visit
Admission: RE | Admit: 2021-06-11 | Discharge: 2021-06-11 | Disposition: A | Discharge status: Home / Self Care | Payer: HMO | Source: Ambulatory Visit | Attending: Acute Care | Admitting: Acute Care

## 2021-06-11 ENCOUNTER — Encounter: Payer: Self-pay | Admitting: Internal Medicine

## 2021-06-11 DIAGNOSIS — F17210 Nicotine dependence, cigarettes, uncomplicated: Secondary | ICD-10-CM

## 2021-06-11 DIAGNOSIS — S2241XA Multiple fractures of ribs, right side, initial encounter for closed fracture: Secondary | ICD-10-CM | POA: Diagnosis not present

## 2021-06-11 DIAGNOSIS — I2510 Atherosclerotic heart disease of native coronary artery without angina pectoris: Secondary | ICD-10-CM | POA: Diagnosis not present

## 2021-06-11 DIAGNOSIS — Z122 Encounter for screening for malignant neoplasm of respiratory organs: Secondary | ICD-10-CM

## 2021-06-11 DIAGNOSIS — K8020 Calculus of gallbladder without cholecystitis without obstruction: Secondary | ICD-10-CM | POA: Diagnosis not present

## 2021-06-11 DIAGNOSIS — Z87891 Personal history of nicotine dependence: Secondary | ICD-10-CM

## 2021-06-12 ENCOUNTER — Other Ambulatory Visit: Payer: Self-pay | Admitting: Internal Medicine

## 2021-06-12 ENCOUNTER — Other Ambulatory Visit: Payer: Self-pay | Admitting: Acute Care

## 2021-06-12 DIAGNOSIS — Z87891 Personal history of nicotine dependence: Secondary | ICD-10-CM

## 2021-06-12 DIAGNOSIS — Z122 Encounter for screening for malignant neoplasm of respiratory organs: Secondary | ICD-10-CM

## 2021-06-12 DIAGNOSIS — F17210 Nicotine dependence, cigarettes, uncomplicated: Secondary | ICD-10-CM

## 2021-06-13 MED ORDER — FERROUS GLUCONATE 324 (38 FE) MG PO TABS: 324.0000 mg | ORAL_TABLET | Freq: Every day | ORAL | 3 refills | Status: DC

## 2021-06-14 ENCOUNTER — Encounter: Payer: Self-pay | Admitting: Acute Care

## 2021-06-14 ENCOUNTER — Ambulatory Visit: Payer: HMO | Admitting: Acute Care

## 2021-06-14 VITALS — BP 118/64 | HR 60 | Temp 97.4°F | Ht 62.0 in | Wt 165.6 lb

## 2021-06-14 DIAGNOSIS — G4733 Obstructive sleep apnea (adult) (pediatric): Secondary | ICD-10-CM

## 2021-06-14 DIAGNOSIS — J449 Chronic obstructive pulmonary disease, unspecified: Secondary | ICD-10-CM | POA: Diagnosis not present

## 2021-06-14 DIAGNOSIS — Z9989 Dependence on other enabling machines and devices: Secondary | ICD-10-CM | POA: Diagnosis not present

## 2021-06-14 DIAGNOSIS — F17210 Nicotine dependence, cigarettes, uncomplicated: Secondary | ICD-10-CM

## 2021-06-14 NOTE — Patient Instructions (Addendum)
It is good to see you today.  ?We will place an order for a new CPAP machine with supplies ?You should get a call from your DME ?You are wearing the Dreamware nasal mask. ?Continue on CPAP at bedtime. You appear to be benefiting from the treatment  ?Goal is to wear for at least 6 hours each night for maximal clinical benefit. ?Continue to work on weight loss, as the link between excess weight  and sleep apnea is well established.   ?Remember to establish a good bedtime routine, and work on sleep hygiene.  ?Limit daytime naps , avoid stimulants such as caffeine and nicotine close to bedtime, exercise daily to promote sleep quality, avoid heavy , spicy, fried , or rich foods before bed. Ensure adequate exposure to natural light during the day,establish a relaxing bedtime routine with a pleasant sleep environment ( Bedroom between 60 and 67 degrees, turn off bright lights , TV or device screens screens , consider black out curtains or white noise machines) ?Do not drive if sleepy. ?Remember to clean mask, tubing, filter, and reservoir once weekly with soapy water.  ?Follow up with Dr. Annamaria Boots in 3 months.   ?Low Dose CT for Lung Cancer Screening in 06/2022.   ?Please contact office for sooner follow up if symptoms do not improve or worsen or seek emergency care   ?

## 2021-06-14 NOTE — Progress Notes (Addendum)
History of Present Illness [name] is a 71 y.o. female with history of OSA . She has been on CPAP therapy x 30 + years . Sh is followed by D. Young.   06/14/2021 Pt.presents for follow up to evaluate her sleep study results and  CPAP therapy, in addition  to reviewing her Ct Chest results.  She has a CPAP machine that is old, but she wants a new machine. She has worn a CPAP machine for > 30 years. Her sleep study did not indicate OSA. It did indicate nocturnal desaturations.Additionally she has obesity Hypoventilation syndrome, Chronic Respiratory Failure and Other sleep Disordered Breathing.  We discussed that these diagnosis should  qualify for a new machine. She does not want to stop therapy as she feels she sleeps much better with her CPAP machine.  She has been using the same machine for about 30 years. She would like a new one.  Lung cancer screening was read as a  Lung RADS 2: nodules that are benign in appearance and behavior with a very low likelihood of becoming a clinically active cancer due to size or lack of growth. Recommendation per radiology is for a repeat LDCT in 12 months. Annual scan has been ordered.   Test Results: 06/11/2021 LDCT  Prominent mediastinal nodes are similar and maintain their fatty hila, suggesting reactive etiology. Hilar regions poorly evaluated without intravenous contrast.   Lungs/Pleura: No pleural fluid.  Moderate centrilobular emphysema.  Lung-RADS 2, benign appearance or behavior. Continue annual screening with low-dose chest CT without contrast in 12 months. 2. Cholelithiasis. 3. Aortic Atherosclerosis (ICD10-I70.0) and Emphysema (ICD10-J43.9).   PFT 04/2016 Interpretation: The FVC, FEV1, FEV1/FVC ratio and FEF25-75% are reduced indicating airway obstruction. The expiratory time was less than six seconds, which may underestimate the degree of obstruction. The FVC is reduced relative to the SVC indicating air trapping. The increased  airway resistance and decreased specific conductance indicate a central airway disease. The lung volumes are reduced. Following administration of bronchodilators, there is no significant response. The reduced diffusing capacity indicates a moderately severe loss of functional alveolar capillary surface. Conclusions: Severe airway obstruction is present. The diffusion defect and reduced lung volumes suggest an early parenchymal process. A clinical trial of bronchodilators may be beneficial in view of the airway obstruction. In view of the severity of the diffusion defect, studies with exercise would be helpful to evaluate the presence of hypoxemia.  Pulmonary Function Diagnosis: Severe Obstructive Airways Disease Insignificant response to bronchodilator Mild Restriction -Parenchymal Moderately severe Diffusion Defect Coronary artery atherosclerosis.    Latest Ref Rng & Units 05/02/2021   10:38 AM 12/27/2020   12:19 PM 09/13/2020    5:54 AM  CBC  WBC 3.4 - 10.8 x10E3/uL 7.3   7.6   8.8    Hemoglobin 11.1 - 15.9 g/dL 10.2   11.8   11.0    Hematocrit 34.0 - 46.6 % 32.5   37.4   37.5    Platelets 150 - 450 x10E3/uL 243   214   196         Latest Ref Rng & Units 05/02/2021   10:38 AM 12/18/2020    4:33 PM 09/13/2020    5:54 AM  BMP  Glucose 70 - 99 mg/dL 165    174    BUN 8 - 27 mg/dL 18    19    Creatinine 0.57 - 1.00 mg/dL 1.04   1.20   1.01    BUN/Creat Ratio 12 -  28 17      Sodium 134 - 144 mmol/L 138    141    Potassium 3.5 - 5.2 mmol/L 4.6    4.2    Chloride 96 - 106 mmol/L 100    106    CO2 20 - 29 mmol/L 26    28    Calcium 8.7 - 10.3 mg/dL 9.5    9.0      BNP    Component Value Date/Time   BNP 40.4 12/06/2018 1103    ProBNP    Component Value Date/Time   PROBNP 132.0 (H) 04/18/2020 1555    PFT    Component Value Date/Time   FEV1PRE 0.99 04/22/2016 1410   FEV1POST 1.05 04/22/2016 1410   FVCPRE 1.32 04/22/2016 1410   FVCPOST 1.52 04/22/2016 1410   TLC 3.63  04/22/2016 1410   DLCOUNC 9.68 04/22/2016 1410   PREFEV1FVCRT 75 04/22/2016 1410   PSTFEV1FVCRT 69 04/22/2016 1410    CT CHEST LUNG CA SCREEN LOW DOSE W/O CM  Result Date: 06/11/2021 CLINICAL DATA:  Sixty-one pack-year smoking history/current smoker EXAM: CT CHEST WITHOUT CONTRAST LOW-DOSE FOR LUNG CANCER SCREENING TECHNIQUE: Multidetector CT imaging of the chest was performed following the standard protocol without IV contrast. RADIATION DOSE REDUCTION: This exam was performed according to the departmental dose-optimization program which includes automated exposure control, adjustment of the mA and/or kV according to patient size and/or use of iterative reconstruction technique. COMPARISON:  05/22/2020 FINDINGS: Cardiovascular: Aortic atherosclerosis. Mild cardiomegaly with lipomatous hypertrophy of the interatrial septum. Left main and right coronary artery calcification. Mediastinum/Nodes: Prominent mediastinal nodes are similar and maintain their fatty hila, suggesting reactive etiology. Hilar regions poorly evaluated without intravenous contrast. Lungs/Pleura: No pleural fluid.  Moderate centrilobular emphysema. Upper Abdomen: Nonspecific caudate lobe enlargement. Normal imaged portions of the spleen, stomach, pancreas, right adrenal gland. Mild left adrenal thickening. Normal imaged left kidney. An exophytic upper pole right renal 2.4 cm low-density lesion is similar in size to on the prior exam, favoring a cyst or minimally complex cyst. Subtle hyperattenuation again identified within the dependent gallbladder. Musculoskeletal: Prior median sternotomy. Right clavicular fixation. Osteopenia. Remote right rib fractures. IMPRESSION: 1. Lung-RADS 2, benign appearance or behavior. Continue annual screening with low-dose chest CT without contrast in 12 months. 2. Cholelithiasis. 3. Aortic Atherosclerosis (ICD10-I70.0) and Emphysema (ICD10-J43.9). Coronary artery atherosclerosis. Electronically Signed   By:  Abigail Miyamoto M.D.   On: 06/11/2021 12:32    Past medical hx Past Medical History:  Diagnosis Date   Abnormality of lung on CXR 02/02/2020   Nonspecific finding on CXR ordered by pulmonologist - c/w inflammation vs infection, f/u imaging suggested, Dr. [name] office has been in communication about recommended next steps.   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   Non-bleeding when visualized on capsule endoscopy 06/30/2015    Arthritis    "lower back; hands" (02/19/2018)   Asthma    Asymptomatic cholelithiasis 09/25/2015   Seen on CT scan 08/2015   Carotid artery stenosis; s/p R endarterectomy    s/p right endarterectomy (06/2010) Carotid US (07/2010):  Left: Moderate-to-severe (60-79%) calcific and non-calcific plaque origin and proximal ICA and ECA    Chronic congestive heart failure with left ventricular diastolic dysfunction (HCC) 10/21/2010   Chronic constipation 02/03/2011   Chronic daily headache 01/16/2014   Chronic iron deficiency anemia    Chronic low back pain 10/06/2012  Chronic venous insufficiency 08/04/2012   COPD (chronic obstructive pulmonary disease) with emphysema (HCC)    PFTs 2018: severe obstructive disease, insignif response to bronchiodilator, mild restriction parenchymal pattern, moderately severe diffusion defect. 2014  FEV1 0.92 (40%), ratio 69, 27% increase in FEV1 with BD, TLC 91%, severe airtrapping, DLCO49% On chronic home O2. Pulmonary rehab referral 05/2012    Dyspnea    Fibromyalgia 08/29/2010   Gastroesophageal reflux disease    History of blood transfusion    "several times"  (02/19/2018)   History of clear cell renal cell carcinoma ([place]), in remission 07/21/2011   s/p cryoablation of left RCC in 09/2011 by [name]. Followed by [name]  [facility]) .     History of hiatal hernia    History of mitral  valve replacement with bioprosthetic valve due to mitral stenosis 2012   s/p MVR with a 27-mm pericardial porcine valve (Medtronic Mosaic valve, serial [id number] on 09/20/10, Dr. Prescott Gum)    History of obstructive sleep apnea, resolved 2013   resolved per sleep study 07/2019; no apnea, but did have desaturation.  CPAP no longer necessary.  Nocturnal polysomnography (06/2009): Moderate sleep apnea/ hypopnea syndrome , AHI 17.8 per hour with nonpositional hypopneas. CPAP titration to 12 CWP, AHI 2.4 per hour. On nocturnal CPAP via a small resMed Quattro full-face mask with heated humidifier.    History of pneumonia    "once"  (02/19/2018)   History of seborrheic keratosis 09/28/2015   Hyperlipidemia LDL goal < 100 11/20/2005   Internal and external hemorrhoids without complication [id number]   Lesion of left native kidney 06/01/2020   Incidental finding on recent screening chest CT for lung ca ordered by Dr. Annamaria Boots pulmonology - he has ordered a f/u renal U/S.    Lichen sclerosus of female genitalia 01/12/2017   Migraine    "none in years" (02/19/2018)   Moderately severe major depression ([place]) 11/19/2005   Nocturnal hypoxia per sleep study 07/2019    Osteoporosis    DEXA 2016: T -2.7; DEXA (12/09/2011): L-spine T -3.7, left hip T -1.4 DEXA (12/2004): L-spine T -2.6, left hip -0.1    Paroxysmal atrial fibrillation ([place]) 10/22/2010   s/p Left atrial maze procedure for paroxysmal atrial fibrillation on 09/20/2010 by Dr Prescott Gum.  Subsequent splenic infarct, decision was made to re-anticoagulate with coumadin, likely life-long as this is the most likely cause of the splenic infarct.    Personal history of colonic polyps 05/14/2011   Colonoscopy (05/2011): 4 mm adenomatous polyp excised endoscopically Colonoscopy (02/2002): Adenomatous polyp excised endoscopically    Personal history of renal cell carcinoma 09/12/2020   Pneumonia    Pulmonary hypertension due to chronic obstructive pulmonary disease ([place])  04/25/2016   2014 TEE w PA peak pressure 46 mmHg, s/p MV replacement    Right nephrolithiasis, asymptomatic, incidental finding 09/06/2014   5 mm non-obstructing calculus seen on CT scan 09/05/2014    Right ventricular failure ([place]) 04/25/2016   Severe obesity (BMI 35.0-39.9) with comorbidity ([place]) 10/23/2011   Sleep apnea    Tobacco abuse 07/28/2012   Type 2 diabetes mellitus with diabetic neuropathy (HCC)      Social History   Tobacco Use   Smoking status: Every Day    Packs/day: 1.00    Years: 55.00    Pack years: 55.00    Types: Cigarettes    Start date: 11/01/1962   Smokeless tobacco: Never  Vaping Use   Vaping Use: Never used  Substance Use Topics  Alcohol use: Not Currently    Comment: 02/19/2018 "nothing since 1999"   Drug use: Not Currently    Types: Cocaine, Marijuana    Comment: 02/19/2018 "nothing since the 1990s"    [name] reports that she has been smoking cigarettes. She started smoking about 58 years ago. She has a 55.00 pack-year smoking history. She has never used smokeless tobacco. She reports that she does not currently use alcohol. She reports that she does not currently use drugs after having used the following drugs: Cocaine and Marijuana.  Tobacco Cessation: Current every day smoker with a 55 pack year smoking history  Past surgical hx, Family hx, Social hx all reviewed.  Current Outpatient Medications on File Prior to Visit  Medication Sig   albuterol (PROAIR HFA) 108 (90 Base) MCG/ACT inhaler Inhale 2 puffs into the lungs every 6 (six) hours as needed for shortness of breath.   ALPRAZolam (XANAX) 1 MG tablet TAKE 1 TABLET BY MOUTH EVERY NIGHT AT BEDTIME AS NEEDED FOR INSOMNIA. MAY TAKE 1/2 TABLET BY MOUTH DURING THE DAY FOR ANXIETY   benzonatate (TESSALON) 100 MG capsule Take 1 capsule (100 mg total) by mouth 3 (three) times daily as needed for cough.   Blood Glucose Monitoring Suppl (ONETOUCH VERIO REFLECT) w/Device KIT Use to check blood sugar 1  time a day   buPROPion (WELLBUTRIN XL) 150 MG 24 hr tablet Take 1 tablet by mouth every morning   Continuous Blood Gluc Sensor (FREESTYLE LIBRE 3 SENSOR) MISC Place 1 sensor on the skin every 14 days. Use to check glucose continuously   DULoxetine (CYMBALTA) 60 MG capsule Take 1 capsule by mouth twice a day.   ferrous gluconate (FERGON) 324 MG tablet Take 1 tablet (324 mg total) by mouth daily with breakfast.   fluticasone (FLONASE) 50 MCG/ACT nasal spray Place 2 sprays into both nostrils daily.   Fluticasone-Umeclidin-Vilant 100-62.5-25 MCG/ACT AEPB Inhale 1 puff into the lungs daily.   furosemide (LASIX) 80 MG tablet Take 1 tablet by mouth every morning AND 1/2 tablet daily in the afternoon.   gabapentin (NEURONTIN) 300 MG capsule Take 2 capsules by mouth 3 times daily.   HYDROcodone-acetaminophen (NORCO/VICODIN) 5-325 MG tablet Take 2 tablets by mouth 3 (three) times daily.   insulin degludec (TRESIBA FLEXTOUCH) 100 UNIT/ML FlexTouch Pen Inject 42 Units into the skin daily.   Insulin Pen Needle 32G X 4 MM MISC Use to inject insulin up to 6 times a day   ipratropium-albuterol (DUONEB) 0.5-2.5 (3) MG/3ML SOLN Take 3 mLs by nebulization every 6 (six) hours as needed (shortness of breath, wheezing).   ketoconazole (NIZORAL) 2 % cream Apply to affected areas nightly at bedtime after cleaning area properly   Lancets Misc. (ACCU-CHEK FASTCLIX LANCET) KIT Check your blood 4 times a day dx code 250.00 insulin requiring   lidocaine (LIDODERM) 5 % Place 1 or 2 patches to painful area of back each day.  Remove & Discard patch within 12 hours or as directed by MD   metoprolol succinate (TOPROL-XL) 50 MG 24 hr tablet Take 1 tablet by mouth daily. Take with or immediately following a meal.   nystatin powder Apply topically 3  times daily.   omeprazole (PRILOSEC) 40 MG capsule Take 1 capsule by mouth in the morning and at bedtime.   ondansetron (ZOFRAN) 4 MG tablet Take 1 tablet (4 mg total) by mouth every 8  (eight) hours as needed for nausea or vomiting.   OXYGEN Inhale 2 L into the lungs at  bedtime.   PRESCRIPTION MEDICATION Inhale into the lungs at bedtime. CPAP   Probiotic Product (ALIGN) 4 MG CAPS See admin instructions.   rosuvastatin (CRESTOR) 20 MG tablet TAKE 1 TABLET BY MOUTH ONCE EVERY NIGHT AT BEDTIME   Semaglutide,0.25 or 0.'5MG'$ /DOS, (OZEMPIC, 0.25 OR 0.5 MG/DOSE,) 2 MG/3ML SOPN Inject 0.5 mg into the skin once a week.   warfarin (COUMADIN) 2.5 MG tablet Take 1 tablet by mouth daily unless otherwise instructed.   Wheat Dextrin (BENEFIBER) POWD See admin instructions.   No current facility-administered medications on file prior to visit.     Allergies  Allergen Reactions   Dexlansoprazole Other (See Comments)    Unknown reaction   Lorazepam Other (See Comments)    Patient's sister noted that ativan caused the patient to become extremely confused during hospitalization 09/2010; tolerates Xanax   Oxycontin [Oxycodone] Other (See Comments)    headache   Tramadol Hcl Swelling    Ankle swelling    Review Of Systems:  Constitutional:   No  weight loss, night sweats,  Fevers, chills, fatigue, or  lassitude.  HEENT:   No headaches,  Difficulty swallowing,  Tooth/dental problems, or  Sore throat,                No sneezing, itching, ear ache, nasal congestion, post nasal drip,   CV:  No chest pain,  Orthopnea, PND, swelling in lower extremities, anasarca, dizziness, palpitations, syncope.   GI  No heartburn, indigestion, abdominal pain, nausea, vomiting, diarrhea, change in bowel habits, loss of appetite, bloody stools.   Resp: + shortness of breath with exertion or at rest.  No excess mucus, no productive cough,  No non-productive cough,  No coughing up of blood.  No change in color of mucus.  No wheezing.  No chest wall deformity  Skin: no rash or lesions.  GU: no dysuria, change in color of urine, no urgency or frequency.  No flank pain, no hematuria   MS:  + joint pain or  swelling.  + decreased range of motion.  No back pain.  Psych:  No change in mood or affect. No depression or anxiety.  No memory loss.   Vital Signs BP 118/64 (BP Location: Left Arm, Patient Position: Sitting, Cuff Size: Normal)   Pulse 60   Temp (!) 97.4 F (36.3 C) (Oral)   Ht '5\' 2"'$  (1.575 m)   Wt 165 lb 9.6 oz (75.1 kg)   SpO2 95%   BMI 30.29 kg/m    Physical Exam:  General- No distress,  A&Ox3, pleasant  ENT: No sinus tenderness, TM clear, pale nasal mucosa, no oral exudate,no post nasal drip, no LAN Cardiac: S1, S2, regular rate and rhythm, no murmur Chest: No wheeze/ rales/ dullness; no accessory muscle use, no nasal flaring, no sternal retractions Abd.: Soft Non-tender, ND, BS +, Body mass index is 30.29 kg/m. Ext: No clubbing cyanosis, edema Neuro:  Physical deconditioning, MAE x 4, A&O x 3 Skin: No rashes, warm and dry, NO lesions  Psych: normal mood and behavior   Assessment/Plan OSA on CPAP  Pt has ben using CPAP x 30 years  using the same machine  Nocturnal hypoxemia>> bleeds oxygen into CPAP circuit. Obesity hypoventilation Syndrome Chronic Respiratory Failure, Other Sleep Disordered Breathing Plan We will place an order for a new CPAP machine with supplies You are wearing the Dreamware nasal mask. Continue on CPAP at bedtime. You appear to be benefiting from the treatment  Goal is to wear for at  least 6 hours each night for maximal clinical benefit. Continue to work on weight loss, as the link between excess weight  and sleep apnea is well established.   Remember to establish a good bedtime routine, and work on sleep hygiene.  Limit daytime naps , avoid stimulants such as caffeine and nicotine close to bedtime, exercise daily to promote sleep quality, avoid heavy , spicy, fried , or rich foods before bed. Ensure adequate exposure to natural light during the day,establish a relaxing bedtime routine with a pleasant sleep environment ( Bedroom between 60 and  67 degrees, turn off bright lights , TV or device screens screens , consider black out curtains or white noise machines) Do not drive if sleepy. Remember to clean mask, tubing, filter, and reservoir once weekly with soapy water.  Follow up with Dr. Annamaria Boots in 3 months.   Low Dose CT for Lung Cancer Screening in 06/2022.   Please contact office for sooner follow up if symptoms do not improve or worsen or seek emergency care    COPD Stable interval Plan Continue Duonebs as needed and Albuterol as rescue    Current Every Day smoke 55 pack year smoking hx. LDCT 06/2021>> LR 2, benign findings Plan Annual screening due 06/2022 Work on quitting smoking completely Call 1-800 QUIT now for few nicotine patches, gum or mints   I spent 35 minutes dedicated to the care of this patient on the date of this encounter to include pre-visit review of records, face-to-face time with the patient discussing conditions above, post visit ordering of testing, clinical documentation with the electronic health record, making appropriate referrals as documented, and communicating necessary information to the patient's healthcare team.   Magdalen Spatz, NP 06/14/2021  11:30 AM

## 2021-06-15 ENCOUNTER — Other Ambulatory Visit (HOSPITAL_COMMUNITY): Payer: Self-pay

## 2021-06-17 ENCOUNTER — Other Ambulatory Visit (HOSPITAL_COMMUNITY): Payer: Self-pay

## 2021-06-17 ENCOUNTER — Other Ambulatory Visit: Payer: Self-pay | Admitting: Internal Medicine

## 2021-06-17 ENCOUNTER — Telehealth: Payer: Self-pay | Admitting: Pharmacist

## 2021-06-17 DIAGNOSIS — D500 Iron deficiency anemia secondary to blood loss (chronic): Secondary | ICD-10-CM | POA: Diagnosis not present

## 2021-06-17 DIAGNOSIS — G8929 Other chronic pain: Secondary | ICD-10-CM | POA: Diagnosis not present

## 2021-06-17 DIAGNOSIS — M19041 Primary osteoarthritis, right hand: Secondary | ICD-10-CM | POA: Diagnosis not present

## 2021-06-17 DIAGNOSIS — M5441 Lumbago with sciatica, right side: Secondary | ICD-10-CM | POA: Diagnosis not present

## 2021-06-17 DIAGNOSIS — M797 Fibromyalgia: Secondary | ICD-10-CM | POA: Diagnosis not present

## 2021-06-17 DIAGNOSIS — M19042 Primary osteoarthritis, left hand: Secondary | ICD-10-CM | POA: Diagnosis not present

## 2021-06-17 DIAGNOSIS — Z794 Long term (current) use of insulin: Secondary | ICD-10-CM | POA: Diagnosis not present

## 2021-06-17 DIAGNOSIS — F32A Depression, unspecified: Secondary | ICD-10-CM | POA: Diagnosis not present

## 2021-06-17 DIAGNOSIS — E1140 Type 2 diabetes mellitus with diabetic neuropathy, unspecified: Secondary | ICD-10-CM | POA: Diagnosis not present

## 2021-06-17 DIAGNOSIS — I739 Peripheral vascular disease, unspecified: Secondary | ICD-10-CM | POA: Diagnosis not present

## 2021-06-17 NOTE — Telephone Encounter (Signed)
Patient reports PST FS POC INR result today as 1.9 (target goal 1.5 - 2.5). Will continue same regimen:Su-2.39mM-0mgT-2.5mgW-2.5mgTh-2.5mgF-1.25mgSa-2.5mg. Repeat PST on Monday 15-MAY-23. Reports no bleeding or symptoms of embolic disease upon questioning.  ?

## 2021-06-18 ENCOUNTER — Other Ambulatory Visit (HOSPITAL_COMMUNITY): Payer: Self-pay

## 2021-06-18 ENCOUNTER — Telehealth: Payer: Self-pay | Admitting: Acute Care

## 2021-06-18 ENCOUNTER — Ambulatory Visit
Admission: RE | Admit: 2021-06-18 | Discharge: 2021-06-18 | Disposition: A | Discharge status: Home / Self Care | Payer: HMO | Source: Ambulatory Visit | Attending: Internal Medicine | Admitting: Internal Medicine

## 2021-06-18 ENCOUNTER — Other Ambulatory Visit: Payer: Self-pay

## 2021-06-18 ENCOUNTER — Other Ambulatory Visit: Payer: Self-pay | Admitting: Internal Medicine

## 2021-06-18 ENCOUNTER — Encounter: Payer: Self-pay | Admitting: Internal Medicine

## 2021-06-18 DIAGNOSIS — E662 Morbid (severe) obesity with alveolar hypoventilation: Secondary | ICD-10-CM

## 2021-06-18 DIAGNOSIS — Z780 Asymptomatic menopausal state: Secondary | ICD-10-CM | POA: Diagnosis not present

## 2021-06-18 DIAGNOSIS — G4734 Idiopathic sleep related nonobstructive alveolar hypoventilation: Secondary | ICD-10-CM

## 2021-06-18 DIAGNOSIS — M85852 Other specified disorders of bone density and structure, left thigh: Secondary | ICD-10-CM | POA: Diagnosis not present

## 2021-06-18 DIAGNOSIS — M810 Age-related osteoporosis without current pathological fracture: Secondary | ICD-10-CM | POA: Diagnosis not present

## 2021-06-18 DIAGNOSIS — F419 Anxiety disorder, unspecified: Secondary | ICD-10-CM

## 2021-06-18 DIAGNOSIS — J9611 Chronic respiratory failure with hypoxia: Secondary | ICD-10-CM

## 2021-06-18 DIAGNOSIS — G8929 Other chronic pain: Secondary | ICD-10-CM

## 2021-06-18 MED ORDER — HYDROCODONE-ACETAMINOPHEN 5-325 MG PO TABS: 2.0000 | ORAL_TABLET | Freq: Three times a day (TID) | ORAL | 0 refills | Status: DC

## 2021-06-18 MED ORDER — HYDROCODONE-ACETAMINOPHEN 5-325 MG PO TABS
2.0000 | ORAL_TABLET | Freq: Three times a day (TID) | ORAL | 0 refills | Status: DC
  Filled 2021-06-20: qty 180, 30d supply, fill #0

## 2021-06-18 MED ORDER — OZEMPIC (0.25 OR 0.5 MG/DOSE) 2 MG/3ML ~~LOC~~ SOPN
0.5000 mg | PEN_INJECTOR | SUBCUTANEOUS | 11 refills | Status: DC
  Filled 2021-06-18 – 2021-07-13 (×2): qty 3, 28d supply, fill #0
  Filled 2021-08-10: qty 3, 28d supply, fill #1
  Filled 2021-09-07: qty 3, 28d supply, fill #2
  Filled 2021-10-10: qty 3, 28d supply, fill #3
  Filled 2021-11-04: qty 3, 28d supply, fill #4
  Filled 2021-12-04: qty 3, 28d supply, fill #5
  Filled 2021-12-22 – 2021-12-31 (×2): qty 3, 28d supply, fill #6
  Filled 2022-01-24: qty 3, 28d supply, fill #7
  Filled 2022-02-27: qty 9, 84d supply, fill #8

## 2021-06-18 MED ORDER — ALPRAZOLAM 1 MG PO TABS
ORAL_TABLET | ORAL | 0 refills | Status: DC
  Filled 2021-06-18: qty 45, 30d supply, fill #0

## 2021-06-18 NOTE — Telephone Encounter (Signed)
Refill Request ? ?HYDROcodone-acetaminophen (NORCO/VICODIN) 5-325 MG tablet ? ? ?ALPRAZolam (XANAX) 1 MG tablet ? ? ?[facility] (Ph: [phone]) ?

## 2021-06-18 NOTE — Assessment & Plan Note (Signed)
Weight has remained stable around BMI 30 for past year.  She continues on semaglutide. ?

## 2021-06-18 NOTE — Telephone Encounter (Signed)
Spoke with patient who thought someone called her about LDCT results. She had reviewed the results with Gladstone Pih NP at visit on 06/14/21 and had no questions.  Patient states someone had called her today regarding home health supplies but she thinks that is taken care. Nothing further needed.  Patient had no questions.  ?

## 2021-06-18 NOTE — Telephone Encounter (Signed)
Hydrocodone ?Last rx written 05/21/21 ?Last OV 06/06/21. ?Next OV 07/04/21. ?UDS 12/16/18. ? ?

## 2021-06-18 NOTE — Assessment & Plan Note (Addendum)
See previous entry.  Feet appear well perfused today, they are not cold and color is less cyanotic.  Pulseless, chronic. [name] appt 08/05/21, second opinion for surgical management of PAD. Continue risk factor management (DM, HTN, lipids are controlled, she has lost weight over past couple of years).  Working on smoking cessation.  Referred to PT for exercise program as tolerated.  Trial cilostazol? ?

## 2021-06-18 NOTE — Assessment & Plan Note (Signed)
F/u colonoscopy is intended (her iron deificiency continues) once her heart doctor has reviewed.  ?

## 2021-06-18 NOTE — Telephone Encounter (Signed)
Patient returning a call- states she believe it was from Judson Roch but not sure.  ? ?Please advise. ? ?

## 2021-06-18 NOTE — Assessment & Plan Note (Signed)
DEXA scheduled ?

## 2021-06-18 NOTE — Assessment & Plan Note (Signed)
At time of this visit, she had used new regimen for 1 week, tolerated well.  Tresiba 42 units daily, Ozempic 0.5 mg daily replaces Xultophy.  DM remains in good control. ?

## 2021-06-18 NOTE — Assessment & Plan Note (Signed)
Under control.  Intertriginous areas of pannus folds with noninflamed  erythema, no open areas, clean and dry.  NP from [facility] had ordered her medication which she feels is effective.  She is aware of importance of daily cleansing, maintaining dry environment.  No benefit of using both cream and powder in the same area at the same time. ?

## 2021-06-18 NOTE — Assessment & Plan Note (Signed)
04/2020: Well-controlled TC 115, HDL 35, LDL 51.  Continue high intensity statin.  Recheck not necessary at this time. ?

## 2021-06-19 ENCOUNTER — Other Ambulatory Visit: Payer: Self-pay | Admitting: Gastroenterology

## 2021-06-19 ENCOUNTER — Other Ambulatory Visit (HOSPITAL_COMMUNITY): Payer: Self-pay

## 2021-06-19 NOTE — Telephone Encounter (Signed)
error ?

## 2021-06-20 ENCOUNTER — Other Ambulatory Visit (HOSPITAL_COMMUNITY): Payer: Self-pay

## 2021-06-24 ENCOUNTER — Other Ambulatory Visit: Payer: Self-pay

## 2021-06-24 ENCOUNTER — Other Ambulatory Visit (HOSPITAL_COMMUNITY): Payer: Self-pay

## 2021-06-24 ENCOUNTER — Telehealth: Payer: Self-pay | Admitting: Pharmacist

## 2021-06-24 DIAGNOSIS — J301 Allergic rhinitis due to pollen: Secondary | ICD-10-CM

## 2021-06-24 MED ORDER — FLUTICASONE PROPIONATE 50 MCG/ACT NA SUSP
2.0000 | Freq: Every day | NASAL | 3 refills | Status: DC
Start: 2021-06-24 — End: 2022-01-23
  Filled 2021-06-24: qty 48, 90d supply, fill #0
  Filled 2021-09-07: qty 48, 90d supply, fill #1
  Filled 2021-12-15: qty 48, 90d supply, fill #2
  Filled 2021-12-22: qty 48, 90d supply, fill #3

## 2021-06-24 NOTE — Telephone Encounter (Signed)
Patient reports PST FS POC INR results just now--1.8 (target goal is 1.5 - 2.5 for the patient.) Will continue same regimen: 1x2.68m on Tu/We/Th/Sa/Su; 1/2 x 2.561m(1.2561mose) on Friday. NO warfarin on Monday. Repeat INR on 22-MAY-23. Denies any signs or symptoms of bleeding. Denies any dyspnea or palpitations.  ?

## 2021-06-26 ENCOUNTER — Ambulatory Visit: Payer: HMO | Admitting: Behavioral Health

## 2021-06-26 DIAGNOSIS — F331 Major depressive disorder, recurrent, moderate: Secondary | ICD-10-CM

## 2021-06-26 DIAGNOSIS — F419 Anxiety disorder, unspecified: Secondary | ICD-10-CM

## 2021-06-26 NOTE — BH Specialist Note (Signed)
Integrated Behavioral Health via Telemedicine Visit ? ?06/26/2021 ?[name] ?[id number] ? ?Number of [facility] Clinician visits: 19 ?Session Start time: 1145 ?Session End time: 1200 ?Total time in minutes: 15-20 min ? ?Referring Provider: Dr. Lenise Herald, MD ?Patient/Family location: Pt is home in private ?[facility] Provider location: [facility] Office ?All persons participating in visit: Pt & Clinician ?Types of Service: Individual psychotherapy ? ?I connected with [name] and/or [name]'s  self  via  Telephone or Geologist, engineering  (Video is Caregility application) and verified that I am speaking with the correct person using two identifiers. Discussed confidentiality: Yes  ? ?I discussed the limitations of telemedicine and the availability of in person appointments.  Discussed there is a possibility of technology failure and discussed alternative modes of communication if that failure occurs. ? ?I discussed that engaging in this telemedicine visit, they consent to the provision of behavioral healthcare and the services will be billed under their insurance. ? ?Patient and/or legal guardian expressed understanding and consented to Telemedicine visit: Yes  ? ?Presenting Concerns: ?Patient and/or family reports the following symptoms/concerns: inc in anx/dep due to health concerns for Family ?Duration of problem: years; Severity of problem: moderate ? ?Patient and/or Family's Strengths/Protective Factors: ?Concrete supports in place (healthy food, safe environments, etc.), Sense of purpose, Caregiver has knowledge of parenting & child development, and Parental Resilience ? ?Goals Addressed: ?Patient will: ? Reduce symptoms of: anxiety, depression, and stress  ? Increase knowledge and/or ability of: coping skills and healthy habits  ? Demonstrate ability to: Increase healthy adjustment to current life circumstances ? ?Progress towards  Goals: ?Ongoing ? ?Interventions: ?Interventions utilized:  Solution-Focused Strategies and Supportive Counseling ?Standardized Assessments completed:  screeners prn ? ?Patient and/or Family Response: Pt receptive to call & requests future calls ? ?Assessment: ?Patient currently experiencing inc in anx/dep due to health issues & upcoming Colonoscopy procedure on June 9th.  ? ?Patient may benefit from cont'd Cslg. ? ?Plan: ?Follow up with behavioral health clinician on : 2-3 wks on telehealth for ck-in ?Behavioral recommendations: None today ?Referral(s): [place] (In Clinic) ? ?I discussed the assessment and treatment plan with the patient and/or parent/guardian. They were provided an opportunity to ask questions and all were answered. They agreed with the plan and demonstrated an understanding of the instructions. ?  ?They were advised to call back or seek an in-person evaluation if the symptoms worsen or if the condition fails to improve as anticipated. ? ?Donnetta Hutching, LMFT ?

## 2021-07-01 ENCOUNTER — Telehealth: Payer: Self-pay | Admitting: Pharmacist

## 2021-07-01 NOTE — Telephone Encounter (Signed)
Patient reports results of PST FS POC INR from today = 2.2 (target INR for this patient is 1.5 - 2.5) on1 x 2.66m warfarin Su/Tu/We/Th/Sa; 1/2 x 2.540m(1.2553mon Fridays; NO warfarin on Mondays. No sx/sx of bleeding or embolic events. CONTINUE same regimen and repeat PST FS POC INR for next Monday, 29-MAY-23.

## 2021-07-02 ENCOUNTER — Other Ambulatory Visit (HOSPITAL_COMMUNITY): Payer: Self-pay

## 2021-07-02 ENCOUNTER — Telehealth: Payer: Self-pay | Admitting: Dietician

## 2021-07-02 ENCOUNTER — Telehealth: Payer: Self-pay | Admitting: Acute Care

## 2021-07-02 MED ORDER — PEG 3350-KCL-NA BICARB-NACL 420 G PO SOLR
ORAL | 0 refills | Status: DC
  Filled 2021-07-02: qty 4000, 1d supply, fill #0

## 2021-07-02 NOTE — Telephone Encounter (Signed)
Called and updated patient that the order was sent to the wrong DME company and that it was our mistake. Told patient that we were sending it in to [place]. Patient verbalized understanding. Nothing further needed

## 2021-07-02 NOTE — Telephone Encounter (Signed)
[name] left 3 voice mails yesterday asking why she is gaining weight when she thought the medicine was supposed "to do the opposite". Would like an explanation.  I discussed that her weight by our records has not changed significantly and that her blood sugars appear to be slightly improved. This may be increasing her weight modestly. Also discussed risks of increased side effects of increased dose of GLP-1 to help with weight loss and lower blood sugar. I suggested she discuss this with Dr. [name] at her next appointment. (Thursday)  Wt Readings from Last 10 Encounters:  06/14/21 165 lb 9.6 oz (75.1 kg)  06/06/21 165 lb 9.6 oz (75.1 kg)  05/02/21 166 lb 8 oz (75.5 kg)  04/19/21 164 lb (74.4 kg)  04/10/21 164 lb (74.4 kg)  03/11/21 165 lb (74.8 kg)  02/22/21 163 lb 12.8 oz (74.3 kg)  12/27/20 165 lb (74.8 kg)  11/08/20 164 lb 6.4 oz (74.6 kg)  09/27/20 164 lb 9.6 oz (74.7 kg)   Lab Results  Component Value Date   HGBA1C 7.2 (A) 05/02/2021   HGBA1C 7.1 (A) 11/08/2020   HGBA1C 7.1 (A) 08/02/2020   HGBA1C 8.1 (A) 04/12/2020   HGBA1C 6.9 (A) 12/08/2019

## 2021-07-02 NOTE — Telephone Encounter (Signed)
Pt was under the impression that CPAP was going to be ordered through a different DME (other than [facility]). Received letter from insurance and their name is listed. Pt really does not want to use them, and she thought Judson Roch had suggested a new company. Pt states she may be out or asleep so to please leave detailed VM. She is the only one who has access to the VM. Please advise.

## 2021-07-04 ENCOUNTER — Other Ambulatory Visit (HOSPITAL_COMMUNITY): Payer: Self-pay

## 2021-07-04 ENCOUNTER — Telehealth (HOSPITAL_COMMUNITY): Payer: Self-pay

## 2021-07-04 ENCOUNTER — Ambulatory Visit (INDEPENDENT_AMBULATORY_CARE_PROVIDER_SITE_OTHER): Payer: HMO | Admitting: Internal Medicine

## 2021-07-04 VITALS — BP 129/40 | HR 71 | Temp 97.5°F | Ht 62.0 in | Wt 166.8 lb

## 2021-07-04 DIAGNOSIS — Z6830 Body mass index (BMI) 30.0-30.9, adult: Secondary | ICD-10-CM

## 2021-07-04 DIAGNOSIS — K5900 Constipation, unspecified: Secondary | ICD-10-CM | POA: Diagnosis not present

## 2021-07-04 DIAGNOSIS — D509 Iron deficiency anemia, unspecified: Secondary | ICD-10-CM

## 2021-07-04 DIAGNOSIS — Z860100 Personal history of colon polyps, unspecified: Secondary | ICD-10-CM

## 2021-07-04 DIAGNOSIS — E1140 Type 2 diabetes mellitus with diabetic neuropathy, unspecified: Secondary | ICD-10-CM | POA: Diagnosis not present

## 2021-07-04 DIAGNOSIS — M797 Fibromyalgia: Secondary | ICD-10-CM

## 2021-07-04 DIAGNOSIS — Z794 Long term (current) use of insulin: Secondary | ICD-10-CM | POA: Diagnosis not present

## 2021-07-04 DIAGNOSIS — F1190 Opioid use, unspecified, uncomplicated: Secondary | ICD-10-CM

## 2021-07-04 DIAGNOSIS — E669 Obesity, unspecified: Secondary | ICD-10-CM

## 2021-07-04 DIAGNOSIS — E1151 Type 2 diabetes mellitus with diabetic peripheral angiopathy without gangrene: Secondary | ICD-10-CM

## 2021-07-04 DIAGNOSIS — K5903 Drug induced constipation: Secondary | ICD-10-CM | POA: Diagnosis not present

## 2021-07-04 DIAGNOSIS — G8929 Other chronic pain: Secondary | ICD-10-CM

## 2021-07-04 DIAGNOSIS — E66811 Obesity, class 1: Secondary | ICD-10-CM

## 2021-07-04 DIAGNOSIS — M5441 Lumbago with sciatica, right side: Secondary | ICD-10-CM

## 2021-07-04 DIAGNOSIS — Z953 Presence of xenogenic heart valve: Secondary | ICD-10-CM | POA: Diagnosis not present

## 2021-07-04 DIAGNOSIS — I739 Peripheral vascular disease, unspecified: Secondary | ICD-10-CM

## 2021-07-04 MED ORDER — TRESIBA FLEXTOUCH 100 UNIT/ML ~~LOC~~ SOPN
45.0000 [IU] | PEN_INJECTOR | Freq: Every day | SUBCUTANEOUS | 5 refills | Status: DC
  Filled 2021-07-04 – 2021-07-13 (×2): qty 12, 26d supply, fill #0

## 2021-07-04 MED ORDER — CILOSTAZOL 50 MG PO TABS
50.0000 mg | ORAL_TABLET | Freq: Two times a day (BID) | ORAL | 5 refills | Status: DC
  Filled 2021-07-04: qty 60, 30d supply, fill #0
  Filled 2021-08-01: qty 60, 30d supply, fill #1

## 2021-07-04 MED ORDER — SENNOSIDES-DOCUSATE SODIUM 8.6-50 MG PO TABS
ORAL_TABLET | ORAL | 3 refills | Status: DC
  Filled 2021-07-04: qty 60, 30d supply, fill #0
  Filled 2021-09-07: qty 60, 15d supply, fill #0
  Filled 2021-10-22 (×2): qty 60, 15d supply, fill #1
  Filled 2021-11-05: qty 40, 10d supply, fill #2
  Filled 2021-12-15 – 2021-12-26 (×2): qty 40, 10d supply, fill #3
  Filled 2022-03-13: qty 60, 15d supply, fill #3
  Filled 2022-04-03 – 2022-04-15 (×2): qty 40, 10d supply, fill #3

## 2021-07-04 MED ORDER — KETOROLAC TROMETHAMINE 30 MG/ML IJ SOLN: 60.0000 mg | Freq: Once | INTRAMUSCULAR | Status: AC

## 2021-07-04 NOTE — Patient Instructions (Signed)
[name], We are increasing your Tresiba dose to 45 units each day.  I have written a prescription for a stool softener/laxative - please take as Mudlogger, as needed.  You can adjust the dose.  I am also prescribing a new medicine for leg circulation pain.  This is a twice a day medicine.  If it works, we will be thrilled.  Good luck with the colonoscopy and the appointment with the vascular specialist.  You may also see Dr. Aundra Dubin before our next appointment.  Take care and stay well, Dr. [name]

## 2021-07-04 NOTE — Progress Notes (Signed)
Close f/u today for ongoing problems as documented in problem-based charting.  Progress with recent PT referral - sessions have begun, working on leg exercises first "trying to make herself do her home based exercise program. "   Wishes to lose abdominal weight to prevent intertriginous yeast infections. Further weight loss might result from increase in semaglutide - will hold off for now.    BP (!) 129/40 (BP Location: Right Arm, Patient Position: Sitting, Cuff Size: Small)   Pulse 71   Temp (!) 97.5 F (36.4 C) (Oral)   Ht _0  (1.575 m)   Wt 166 lb 12.8 oz (75.7 kg)   SpO2 98%   BMI 30.51 kg/m   No cough, no tremor during visit.  No increased respiratory effort.  Legs no edema, warm, with dependent rubor.  Intertriginous areas with chronic skin changes, no active maceration or infection.    Chronic iron deficiency anemia Colonoscopy  scheduled early June.  Started ferrous gluconate, now very constipated (has not had this complication before with oral iron).  Has tried a number of interventions, including expired Fleets enema and digital disimpaction.  Finally moved.  Took an IB guard, otc, stopped the iron in the meantime.  History of mitral valve replacement with bioprosthetic valve Warfarin management overseen by Dr. Elie Confer continues, with therapeutic range achieved most of the time.  Anticoagulation could be a contributor to chronic GI blood loss anemia.  No overt bleeding complications.    Personal history of colonic polyps Colonoscopy scheduled early June 2023.  Chronic, continuous use of opioids for chronic pain F/u of recent conversation regarding potential pain clinic referral or change to suboxone. She wishes to avoid a situation where more opioid or sedating medications would be recommended.  Has tried a TENS in the past, 72 years old, wasn't very effective.  At one point was going to get a prescription unit.  "LEt's discuss this later. "  No changes today.  Type 2  diabetes mellitus with diabetic neuropathy ([place]) - recent family celebrations involving pizza, cake;  doesn't always adhere to diet, but does try to watch carbs.  Has given up muffins. CGM ome monitoring values reviewed: 57% in range, 40% high, 3% very high, no lows.  Continue semaglutide 0.5 mg; increase Tresiba to 45 units daily.  Peripheral arterial disease ([place]) Upcoming appt with vascular surgeon (wishes to transfer care there) for second opinion.  Discussed with her the potential benefit of cilastozol, she agrees.  Has begun leg work with home PT.     Obesity (BMI 30.0-34.9) SHe had called clinic concerned that she had been gaining by her scale.  We reviewed readings and she is reassured that she has been stable over the past year.  Fibromyalgia Significant generalized pain today; has already taken her prescribed medications.  She requests an injection prior to going out for birthday (her sister's) lunch and has had improvement in the past with Toradol.  Renal function reviewed.  Injection given.

## 2021-07-04 NOTE — Telephone Encounter (Signed)
Received a medical clearance form from [facility] requesting Cardiology clearance for the following procedure: Colonoscopy. Medical clearance form was signed by Dr. Aundra Dubin and successfully faxed to [phone]. Medical clearance form will be scanned into patients chart.

## 2021-07-05 ENCOUNTER — Other Ambulatory Visit (HOSPITAL_COMMUNITY): Payer: Self-pay

## 2021-07-05 LAB — CBC
Hematocrit: 33.8 % — ABNORMAL LOW (ref 34.0–46.6)
Hemoglobin: 10.8 g/dL — ABNORMAL LOW (ref 11.1–15.9)
MCH: 26.6 pg (ref 26.6–33.0)
MCHC: 32 g/dL (ref 31.5–35.7)
MCV: 83 fL (ref 79–97)
Platelets: 217 10*3/uL (ref 150–450)
RBC: 4.06 x10E6/uL (ref 3.77–5.28)
RDW: 20.3 % — ABNORMAL HIGH (ref 11.7–15.4)
WBC: 7.5 10*3/uL (ref 3.4–10.8)

## 2021-07-09 ENCOUNTER — Telehealth: Payer: Self-pay | Admitting: Pharmacist

## 2021-07-09 DIAGNOSIS — Z7901 Long term (current) use of anticoagulants: Secondary | ICD-10-CM | POA: Diagnosis not present

## 2021-07-09 DIAGNOSIS — I480 Paroxysmal atrial fibrillation: Secondary | ICD-10-CM | POA: Diagnosis not present

## 2021-07-09 NOTE — Telephone Encounter (Signed)
Patient reports patient self testing, finger-stick, point of care INR = 1.5, day following her "no dose on Mondays." Target range 1.5 - 2.5. Will continue same regimen: 1 x 2.44m on Tu/We/Th/Sa/Su; 1/2 x 2.554m(1.2532mon Fridays; 0mg57m Mondays. INR 5-JUN-23. No new medications, no missed doses other than her always omitted dose on Monday(s). No symptoms/signs of bleeding or embolic disease she states.

## 2021-07-10 ENCOUNTER — Telehealth: Payer: Self-pay | Admitting: Internal Medicine

## 2021-07-10 ENCOUNTER — Encounter: Payer: Self-pay | Admitting: Internal Medicine

## 2021-07-10 DIAGNOSIS — F1190 Opioid use, unspecified, uncomplicated: Secondary | ICD-10-CM | POA: Insufficient documentation

## 2021-07-10 DIAGNOSIS — K5900 Constipation, unspecified: Secondary | ICD-10-CM | POA: Insufficient documentation

## 2021-07-10 NOTE — Assessment & Plan Note (Signed)
Significant generalized pain today; has already taken her prescribed medications.  She requests an injection prior to going out for birthday (her sister's) lunch and has had improvement in the past with Toradol.  Renal function reviewed.  Injection given.

## 2021-07-10 NOTE — Assessment & Plan Note (Signed)
Upcoming appt with vascular surgeon (wishes to transfer care there) for second opinion.  Discussed with her the potential benefit of cilastozol, she agrees.  Has begun leg work with home PT.

## 2021-07-10 NOTE — Assessment & Plan Note (Signed)
-  recent family celebrations involving pizza, cake;  doesn't always adhere to diet, but does try to watch carbs.  Has given up muffins. CGM ome monitoring values reviewed: 57% in range, 40% high, 3% very high, no lows.  Continue semaglutide 0.5 mg; increase Tresiba to 45 units daily.

## 2021-07-10 NOTE — Assessment & Plan Note (Signed)
SHe had called clinic concerned that she had been gaining by her scale.  We reviewed readings and she is reassured that she has been stable over the past year.

## 2021-07-10 NOTE — Assessment & Plan Note (Signed)
Colonoscopy scheduled early June 2023.

## 2021-07-10 NOTE — Assessment & Plan Note (Signed)
Colonoscopy  scheduled early June.  Started ferrous gluconate, now very constipated (has not had this complication before with oral iron).  Has tried a number of interventions, including expired Fleets enema and digital disimpaction.  Finally moved.  Took an IB guard, otc, stopped the iron in the meantime.

## 2021-07-10 NOTE — Assessment & Plan Note (Signed)
Warfarin management overseen by Dr. Elie Confer continues, with therapeutic range achieved most of the time.  Anticoagulation could be a contributor to chronic GI blood loss anemia.  No overt bleeding complications.

## 2021-07-10 NOTE — Assessment & Plan Note (Signed)
F/u of recent conversation regarding potential pain clinic referral or change to suboxone. She wishes to avoid a situation where more opioid or sedating medications would be recommended.  Has tried a TENS in the past, 72 years old, wasn't very effective.  At one point was going to get a prescription unit.  "LEt's discuss this later. "  No changes today.

## 2021-07-11 ENCOUNTER — Encounter (HOSPITAL_COMMUNITY): Payer: Self-pay | Admitting: Gastroenterology

## 2021-07-11 NOTE — Telephone Encounter (Signed)
Please advise.

## 2021-07-12 ENCOUNTER — Telehealth: Payer: Self-pay | Admitting: Student

## 2021-07-12 NOTE — Telephone Encounter (Signed)
Her sleep study showed that she does not have obstructive sleep apnea. Could [facility] accept her for a dx of Chronic respiratory Failure with Hypoxia ?

## 2021-07-12 NOTE — Telephone Encounter (Signed)
Patient paged after hours number. She was awoken to her free style libre 3 alarm going off and notifying her a glucose level of 68. She immediately ate 30 grams of candy. Her glucose levels increased to the 70's. She remained asymptomatic throughout. She recently increased her tresiba from 43 u to 45 u. She had Pho for dinner and does not believe she ate enough carbs.   Discussed continuing to trend glucose levels and if no increase to call back. Will forward message to PCP. Instructed patient to decrease tresiba back to 43 units.

## 2021-07-13 ENCOUNTER — Other Ambulatory Visit (HOSPITAL_COMMUNITY): Payer: Self-pay

## 2021-07-15 ENCOUNTER — Telehealth: Payer: Self-pay | Admitting: Pharmacist

## 2021-07-15 ENCOUNTER — Other Ambulatory Visit (HOSPITAL_COMMUNITY): Payer: Self-pay

## 2021-07-15 NOTE — Telephone Encounter (Signed)
Noted. Per her remote download she is now using 42 units daily of Tresiba and has not had any episodes of hypoglycemia over the past 3 days.

## 2021-07-15 NOTE — Telephone Encounter (Signed)
Patient reports PST FS POC INR = 2.1. Planned colonoscopy for FRIDAY 9-JUN-23. Advised to HOLD/OMIT warfarin today - Friday. Recommence warfarin on Saturday 10-JUN-23. Take per normal instructions. Repeat PST FS INR 19-JUN-23.

## 2021-07-16 ENCOUNTER — Other Ambulatory Visit (HOSPITAL_COMMUNITY): Payer: Self-pay

## 2021-07-16 ENCOUNTER — Other Ambulatory Visit: Payer: Self-pay | Admitting: *Deleted

## 2021-07-16 NOTE — Telephone Encounter (Signed)
I received this message back from [facility]  CPAP for Chronic Respiratory Failure would not be covered.  Patient may qualify for BiPAP if we have the following documentation.  Severe COPD An BiPAP device is covered if criteria A - C are met.  A. An arterial blood gas PaCO2, done while awake and breathing the member's prescribed FIO2, is greater than or equal to 52 mm Hg  B. Sleep oximetry demonstrates oxygen saturation less than or equal to 88% for greater than or equal to a cumulative 5 minutes of nocturnal recording time (minimum recording time of 2 hours), done while breathing oxygen at 2 LPM or the member's prescribed FIO2 (whichever is higher).  C. Prior to initiating therapy, sleep apnea and treatment with a continuous positive airway pressure device (CPAP) has been considered and ruled out.   If all of the above criteria for members with COPD are met, an BiPAP device will be covered for the first three months of therapy. If all of the above criteria are not met, BiPAP and related accessories will be denied as not reasonable and necessary.   If you have any question just give me a call.  Thanks, [employer]

## 2021-07-16 NOTE — Telephone Encounter (Signed)
Dr. Annamaria Boots, Please see message from [facility] [place]) regarding CPAP and advise.  Thank you

## 2021-07-17 ENCOUNTER — Other Ambulatory Visit (HOSPITAL_COMMUNITY): Payer: Self-pay

## 2021-07-17 ENCOUNTER — Telehealth: Payer: Self-pay | Admitting: Acute Care

## 2021-07-17 MED ORDER — KETOCONAZOLE 2 % EX CREA
1.0000 "application " | TOPICAL_CREAM | Freq: Every evening | CUTANEOUS | 5 refills | Status: DC | PRN
  Filled 2021-07-17: qty 60, 60d supply, fill #0
  Filled 2021-09-07: qty 60, 30d supply, fill #1
  Filled 2021-12-22 – 2022-02-27 (×2): qty 60, 30d supply, fill #2
  Filled 2022-03-13: qty 60, 30d supply, fill #3

## 2021-07-17 MED ORDER — POTASSIUM CHLORIDE CRYS ER 20 MEQ PO TBCR
20.0000 meq | EXTENDED_RELEASE_TABLET | ORAL | 3 refills | Status: DC
  Filled 2021-07-17: qty 270, 90d supply, fill #0

## 2021-07-18 NOTE — Telephone Encounter (Signed)
ATC patient but she stated that she only wants to speak to Judson Roch NP.   She would not give anymore information than that.   She can be reached at [phone]  Please advise Judson Roch

## 2021-07-19 ENCOUNTER — Ambulatory Visit (HOSPITAL_COMMUNITY): Payer: HMO | Admitting: Anesthesiology

## 2021-07-19 ENCOUNTER — Other Ambulatory Visit: Payer: Self-pay

## 2021-07-19 ENCOUNTER — Encounter (HOSPITAL_COMMUNITY)
Admission: RE | Disposition: A | Discharge status: Home / Self Care | Payer: Self-pay | Source: Ambulatory Visit | Attending: Gastroenterology

## 2021-07-19 ENCOUNTER — Telehealth: Payer: Self-pay | Admitting: Acute Care

## 2021-07-19 ENCOUNTER — Ambulatory Visit (HOSPITAL_COMMUNITY)
Admission: RE | Admit: 2021-07-19 | Discharge: 2021-07-19 | Disposition: A | Discharge status: Home / Self Care | Payer: HMO | Source: Ambulatory Visit | Attending: Gastroenterology | Admitting: Gastroenterology

## 2021-07-19 ENCOUNTER — Ambulatory Visit (HOSPITAL_BASED_OUTPATIENT_CLINIC_OR_DEPARTMENT_OTHER): Payer: HMO | Admitting: Anesthesiology

## 2021-07-19 ENCOUNTER — Encounter (HOSPITAL_COMMUNITY): Payer: Self-pay | Admitting: Gastroenterology

## 2021-07-19 DIAGNOSIS — Z86010 Personal history of colonic polyps: Secondary | ICD-10-CM | POA: Diagnosis not present

## 2021-07-19 DIAGNOSIS — J439 Emphysema, unspecified: Secondary | ICD-10-CM | POA: Insufficient documentation

## 2021-07-19 DIAGNOSIS — K635 Polyp of colon: Secondary | ICD-10-CM | POA: Diagnosis not present

## 2021-07-19 DIAGNOSIS — K5730 Diverticulosis of large intestine without perforation or abscess without bleeding: Secondary | ICD-10-CM | POA: Diagnosis not present

## 2021-07-19 DIAGNOSIS — K648 Other hemorrhoids: Secondary | ICD-10-CM

## 2021-07-19 DIAGNOSIS — Z1211 Encounter for screening for malignant neoplasm of colon: Secondary | ICD-10-CM | POA: Insufficient documentation

## 2021-07-19 DIAGNOSIS — F17200 Nicotine dependence, unspecified, uncomplicated: Secondary | ICD-10-CM | POA: Diagnosis not present

## 2021-07-19 DIAGNOSIS — I110 Hypertensive heart disease with heart failure: Secondary | ICD-10-CM | POA: Diagnosis not present

## 2021-07-19 DIAGNOSIS — Z9981 Dependence on supplemental oxygen: Secondary | ICD-10-CM | POA: Insufficient documentation

## 2021-07-19 DIAGNOSIS — I739 Peripheral vascular disease, unspecified: Secondary | ICD-10-CM | POA: Diagnosis not present

## 2021-07-19 DIAGNOSIS — K219 Gastro-esophageal reflux disease without esophagitis: Secondary | ICD-10-CM | POA: Insufficient documentation

## 2021-07-19 DIAGNOSIS — Z794 Long term (current) use of insulin: Secondary | ICD-10-CM | POA: Insufficient documentation

## 2021-07-19 DIAGNOSIS — I509 Heart failure, unspecified: Secondary | ICD-10-CM | POA: Diagnosis not present

## 2021-07-19 DIAGNOSIS — K644 Residual hemorrhoidal skin tags: Secondary | ICD-10-CM | POA: Insufficient documentation

## 2021-07-19 DIAGNOSIS — D123 Benign neoplasm of transverse colon: Secondary | ICD-10-CM | POA: Insufficient documentation

## 2021-07-19 DIAGNOSIS — Z8719 Personal history of other diseases of the digestive system: Secondary | ICD-10-CM | POA: Diagnosis not present

## 2021-07-19 DIAGNOSIS — E119 Type 2 diabetes mellitus without complications: Secondary | ICD-10-CM | POA: Diagnosis not present

## 2021-07-19 DIAGNOSIS — D122 Benign neoplasm of ascending colon: Secondary | ICD-10-CM | POA: Diagnosis not present

## 2021-07-19 DIAGNOSIS — M797 Fibromyalgia: Secondary | ICD-10-CM | POA: Diagnosis not present

## 2021-07-19 DIAGNOSIS — I4891 Unspecified atrial fibrillation: Secondary | ICD-10-CM | POA: Diagnosis not present

## 2021-07-19 HISTORY — PX: BIOPSY: SHX5522

## 2021-07-19 HISTORY — PX: COLONOSCOPY WITH PROPOFOL: SHX5780

## 2021-07-19 HISTORY — PX: POLYPECTOMY: SHX5525

## 2021-07-19 LAB — GLUCOSE, CAPILLARY: Glucose-Capillary: 130 mg/dL — ABNORMAL HIGH (ref 70–99)

## 2021-07-19 SURGERY — COLONOSCOPY WITH PROPOFOL
Anesthesia: Monitor Anesthesia Care

## 2021-07-19 MED ORDER — ONDANSETRON HCL 4 MG/2ML IJ SOLN
INTRAMUSCULAR | Status: DC | PRN
  Administered 2021-07-19: 4 mg via INTRAVENOUS

## 2021-07-19 MED ORDER — MIDAZOLAM HCL 2 MG/2ML IJ SOLN
INTRAMUSCULAR | Status: AC
  Filled 2021-07-19: qty 2

## 2021-07-19 MED ORDER — PROPOFOL 10 MG/ML IV BOLUS
INTRAVENOUS | Status: AC
  Filled 2021-07-19: qty 20

## 2021-07-19 MED ORDER — PROPOFOL 1000 MG/100ML IV EMUL
INTRAVENOUS | Status: AC
  Filled 2021-07-19: qty 100

## 2021-07-19 MED ORDER — SODIUM CHLORIDE 0.9 % IV SOLN: INTRAVENOUS | Status: DC

## 2021-07-19 MED ORDER — MIDAZOLAM HCL 5 MG/5ML IJ SOLN
INTRAMUSCULAR | Status: DC | PRN
  Administered 2021-07-19: 2 mg via INTRAVENOUS

## 2021-07-19 MED ORDER — LACTATED RINGERS IV SOLN: INTRAVENOUS | Status: DC

## 2021-07-19 MED ORDER — PROPOFOL 500 MG/50ML IV EMUL
INTRAVENOUS | Status: DC | PRN
  Administered 2021-07-19: 100 mg via INTRAVENOUS
  Administered 2021-07-19: 50 mg via INTRAVENOUS
  Administered 2021-07-19: 50 ug/kg/min via INTRAVENOUS
  Administered 2021-07-19: 50 mg via INTRAVENOUS

## 2021-07-19 SURGICAL SUPPLY — 22 items

## 2021-07-19 NOTE — Progress Notes (Signed)
[name] 11:13 AM  Subjective: Patient without any GI complaints no new medical problems since we have seen her recently in the office and her Coumadin was stopped on Sunday and she has no other complaints her previous procedure was reviewed  Objective: Vital signs stable afebrile no acute distress exam please see preassessment evaluation  Assessment: History of colon polyp due for colonic screening  Plan: Okay to proceed with colonoscopy with anesthesia assistance  [facility]  office [phone] After 5PM or if no answer call [phone]

## 2021-07-19 NOTE — Telephone Encounter (Signed)
Please call patient .( Monday is fine)  Let her know [facility] is not part of [facility] per everyone in the office. Please make sure her CPAP orders go to [facility] , and please call them and make sure they give her a call. She needs CPAP set up. Thanks so much

## 2021-07-19 NOTE — Anesthesia Preprocedure Evaluation (Addendum)
Anesthesia Evaluation  Patient identified by MRN, date of birth, ID band Patient awake    Reviewed: Allergy & Precautions, NPO status , Patient's Chart, lab work & pertinent test results  Airway Mallampati: III  TM Distance: >3 FB Neck ROM: Full    Dental no notable dental hx. (+) Edentulous Upper, Edentulous Lower   Pulmonary shortness of breath, asthma , sleep apnea (O2 at night), Continuous Positive Airway Pressure Ventilation and Oxygen sleep apnea , COPD, Current Smoker,  COPD with emphysema   PFTs 2018: severe obstructive disease, insignif response to bronchiodilator, mild restriction parenchymal pattern, moderately severe diffusion defect. 2014  FEV1 0.92 (40%), ratio 69, 27% increase in FEV1 with BD, TLC 91%, severe airtrapping, DLCO49% On chronic home O2. Pulmonary rehab    Pulmonary exam normal breath sounds clear to auscultation       Cardiovascular hypertension, Pt. on medications + Peripheral Vascular Disease and +CHF  Normal cardiovascular exam+ dysrhythmias Atrial Fibrillation  Rhythm:Regular Rate:Normal  01/2020:  1. Left ventricular ejection fraction, by estimation, is 65 to 70%. The  left ventricle has normal function. The left ventricle has no regional  wall motion abnormalities. There is mild concentric left ventricular  hypertrophy. Left ventricular diastolic  function could not be evaluated.  2. Right ventricular systolic function is normal. The right ventricular  size is normal. There is mildly elevated pulmonary artery systolic  pressure. The estimated right ventricular systolic pressure is 67.3 mmHg.  3. Left atrial size was moderately dilated.  4. Right atrial size was mildly dilated.  5. The mitral valve has been repaired/replaced. Mild mitral valve  regurgitation. No evidence of mitral stenosis. The mean mitral valve  gradient is 4.0 mmHg with average heart rate of 62 bpm. There is a 27 mm  porcine  present in the mitral position.  6. Tricuspid valve regurgitation is mild to moderate.  7. The aortic valve is normal in structure. Aortic valve regurgitation is  not visualized. No aortic stenosis is present.  8. The inferior vena cava is normal in size with greater than 50%  respiratory variability, suggesting right atrial pressure of 3 mmHg.    Neuro/Psych  Headaches, PSYCHIATRIC DISORDERS Anxiety Depression    GI/Hepatic hiatal hernia, GERD  ,  Endo/Other  diabetes, Type 2, Insulin Dependent  Renal/GU Renal disease (renal cell cancer)     Musculoskeletal  (+) Arthritis , Fibromyalgia -  Abdominal   Peds  Hematology  (+) Blood dyscrasia, anemia ,   Anesthesia Other Findings   Reproductive/Obstetrics                            Anesthesia Physical  Anesthesia Plan  ASA: 4  Anesthesia Plan: MAC   Post-op Pain Management: Minimal or no pain anticipated   Induction: Intravenous  PONV Risk Score and Plan: 2 and Propofol infusion  Airway Management Planned: Natural Airway  Additional Equipment: None  Intra-op Plan:   Post-operative Plan: Extubation in OR  Informed Consent: I have reviewed the patients History and Physical, chart, labs and discussed the procedure including the risks, benefits and alternatives for the proposed anesthesia with the patient or authorized representative who has indicated his/her understanding and acceptance.       Plan Discussed with: CRNA, Anesthesiologist and Surgeon  Anesthesia Plan Comments: (See PAT note 09/07/2020, Konrad Felix, PA-C)       Anesthesia Quick Evaluation

## 2021-07-19 NOTE — Op Note (Signed)
[facility] Patient Name: [name] Procedure Date: 07/19/2021 MRN: [id number] Attending MD: Clarene Essex , MD Date of Birth: 16-Dec-1950 CSN: [id number] Age: 71 Admit Type: Outpatient Procedure:                Colonoscopy Indications:              High risk colon cancer surveillance: Personal                            history of colonic polyps, Last colonoscopy 1 year                            ago Providers:                Clarene Essex, MD, Dulcy Fanny, [facility]                            Technician, Technician Referring MD:              Medicines:                Monitored Anesthesia Care Complications:            No immediate complications. Estimated Blood Loss:     Estimated blood loss was minimal. Procedure:                Pre-Anesthesia Assessment:                           - Prior to the procedure, a History and Physical                            was performed, and patient medications and                            allergies were reviewed. The patient's tolerance of                            previous anesthesia was also reviewed. The risks                            and benefits of the procedure and the sedation                            options and risks were discussed with the patient.                            All questions were answered, and informed consent                            was obtained. Prior Anticoagulants: The patient has                            taken Coumadin (warfarin), last dose was 5 days                            prior to procedure.  ASA Grade Assessment: III - A                            patient with severe systemic disease. After                            reviewing the risks and benefits, the patient was                            deemed in satisfactory condition to undergo the                            procedure.                           After obtaining informed consent, the colonoscope                            was  passed under direct vision. Throughout the                            procedure, the patient's blood pressure, pulse, and                            oxygen saturations were monitored continuously. The                            PCF-HQ190L ([id number]) Olympus colonoscope was                            introduced through the anus and advanced to the the                            cecum, identified by appendiceal orifice and                            ileocecal valve. The ileocecal valve, appendiceal                            orifice, and rectum were photographed. The                            colonoscopy was somewhat difficult due to                            significant looping and a tortuous colon.                            Successful completion of the procedure was aided by                            applying abdominal pressure. The patient tolerated                            the procedure  well. The quality of the bowel                            preparation was adequate to identify polyps 6 mm                            and larger in size. Scope In: 11:23:00 AM Scope Out: 11:53:46 AM Scope Withdrawal Time: 0 hours 20 minutes 34 seconds  Total Procedure Duration: 0 hours 30 minutes 46 seconds  Findings:      External and internal hemorrhoids were found during retroflexion, during       perianal exam and during digital exam. The hemorrhoids were small.      A few small-mouthed diverticula were found in the sigmoid colon.      A small polyp was found in the proximal transverse colon. The polyp was       semi-sessile. The polyp was removed with a cold snare. Resection and       retrieval were complete.      Six semi-sessile polyps were found in the transverse colon and ascending       colon. The polyps were small in size. These were biopsied with a cold       forceps for histology.      The exam was otherwise without abnormality. Impression:               - External and internal  hemorrhoids.                           - Diverticulosis in the sigmoid colon.                           - One small polyp in the proximal transverse colon,                            removed with a cold snare. Resected and retrieved.                           - Six small polyps in the transverse colon and in                            the ascending colon. Biopsied.                           - The examination was otherwise normal. Moderate Sedation:      Not Applicable - Patient had care per Anesthesia. Recommendation:           - Patient has a contact number available for                            emergencies. The signs and symptoms of potential                            delayed complications were discussed with the                            patient. Return to normal activities  tomorrow.                            Written discharge instructions were provided to the                            patient.                           - Soft diet today.                           - Continue present medications.                           - Resume Coumadin (warfarin) at prior dose in 2                            days.                           - Return to GI clinic PRN.                           - Telephone GI clinic for pathology results in 1                            week.                           - Telephone GI clinic if symptomatic PRN. Procedure Code(s):        --- Professional ---                           [phone], Colonoscopy, flexible; with removal of                            tumor(s), polyp(s), or other lesion(s) by snare                            technique                           45380, 18, Colonoscopy, flexible; with biopsy,                            single or multiple Diagnosis Code(s):        --- Professional ---                           K63.5, Polyp of colon                           Z86.010, Personal history of colonic polyps                           K57.30,  Diverticulosis of large intestine without  perforation or abscess without bleeding CPT copyright 2019 American Medical Association. All rights reserved. The codes documented in this report are preliminary and upon coder review may  be revised to meet current compliance requirements. Clarene Essex, MD 07/19/2021 12:08:46 PM This report has been signed electronically. Number of Addenda: 0

## 2021-07-19 NOTE — Transfer of Care (Signed)
Immediate Anesthesia Transfer of Care Note  Patient: [name]  Procedure(s) Performed: COLONOSCOPY WITH PROPOFOL POLYPECTOMY  Patient Location: PACU and Endoscopy Unit  Anesthesia Type:MAC  Level of Consciousness: awake, alert  and oriented  Airway & Oxygen Therapy: Patient Spontanous Breathing and Patient connected to face mask oxygen  Post-op Assessment: Report given to RN and Post -op Vital signs reviewed and stable  Post vital signs: Reviewed and stable  Last Vitals:  Vitals Value Taken Time  BP 190/51 07/19/21 1200  Temp    Pulse 86 07/19/21 1202  Resp 23 07/19/21 1202  SpO2 100 % 07/19/21 1202  Vitals shown include unvalidated device data.  Last Pain:  Vitals:   07/19/21 1200  TempSrc:   PainSc: 0-No pain         Complications: No notable events documented.

## 2021-07-19 NOTE — Anesthesia Postprocedure Evaluation (Signed)
Anesthesia Post Note  Patient: [name]  Procedure(s) Performed: COLONOSCOPY WITH PROPOFOL POLYPECTOMY     Patient location during evaluation: PACU Anesthesia Type: MAC Level of consciousness: awake and alert Pain management: pain level controlled Vital Signs Assessment: post-procedure vital signs reviewed and stable Respiratory status: spontaneous breathing, nonlabored ventilation, respiratory function stable and patient connected to nasal cannula oxygen Cardiovascular status: stable and blood pressure returned to baseline Postop Assessment: no apparent nausea or vomiting Anesthetic complications: no   No notable events documented.  Last Vitals:  Vitals:   07/19/21 1210 07/19/21 1220  BP: (!) 149/68 (!) 167/47  Pulse: 81 79  Resp: 12 19  Temp: 36.9 C   SpO2: 95% 95%    Last Pain:  Vitals:   07/19/21 1220  TempSrc:   PainSc: 0-No pain                 Pricella Gaugh

## 2021-07-19 NOTE — Discharge Instructions (Addendum)
Call if question or problem otherwise start with soft solids slowly advance your diet and resume Coumadin on Sunday if doing okay and no signs of bleeding and call as needed or for biopsy results in 1 week  YOU HAD AN ENDOSCOPIC PROCEDURE TODAY: Refer to the procedure report and other information in the discharge instructions given to you for any specific questions about what was found during the examination. If this information does not answer your questions, please call [facility] office at [phone] to clarify.   YOU SHOULD EXPECT: Some feelings of bloating in the abdomen. Passage of more gas than usual. Walking can help get rid of the air that was put into your GI tract during the procedure and reduce the bloating. If you had a lower endoscopy (such as a colonoscopy or flexible sigmoidoscopy) you may notice spotting of blood in your stool or on the toilet paper. Some abdominal soreness may be present for a day or two, also.  DIET: Your first meal following the procedure should be a light meal and then it is ok to progress to your normal diet. A half-sandwich or bowl of soup is an example of a good first meal. Heavy or fried foods are harder to digest and may make you feel nauseous or bloated. Drink plenty of fluids but you should avoid alcoholic beverages for 24 hours. If you had a esophageal dilation, please see attached instructions for diet.    ACTIVITY: Your care partner should take you home directly after the procedure. You should plan to take it easy, moving slowly for the rest of the day. You can resume normal activity the day after the procedure however YOU SHOULD NOT DRIVE, use power tools, machinery or perform tasks that involve climbing or major physical exertion for 24 hours (because of the sedation medicines used during the test).   SYMPTOMS TO REPORT IMMEDIATELY: A gastroenterologist can be reached at any hour. Please call [phone]  for any of the following symptoms:  Following  lower endoscopy (colonoscopy, flexible sigmoidoscopy) Excessive amounts of blood in the stool  Significant tenderness, worsening of abdominal pains  Swelling of the abdomen that is new, acute  Fever of 100 or higher  FOLLOW UP:  If any biopsies were taken you will be contacted by phone or by letter within the next 1-3 weeks. Call [phone]  if you have not heard about the biopsies in 3 weeks.  Please also call with any specific questions about appointments or follow up tests. YOU HAD AN ENDOSCOPIC PROCEDURE TODAY: Refer to the procedure report and other information in the discharge instructions given to you for any specific questions about what was found during the examination. If this information does not answer your questions, please call [facility] office at [phone] to clarify.

## 2021-07-19 NOTE — Telephone Encounter (Signed)
Routing directly to Judson Roch.

## 2021-07-22 NOTE — Telephone Encounter (Signed)
Patient is returning phone call. Patient phone number is [phone].

## 2021-07-22 NOTE — Telephone Encounter (Signed)
Patient is aware of below message and voiced her understanding.  She stated that [facility] has not contacted her.   Melissa with [facility] stated that order was received, however they are needing a different dx for cpap.  Current dx is respiratory failure. This dx will not cover cpap machine.  Insurance would approve bipap machine with this dx if there was a blood gas on file.  Sarah, please advise. thanks

## 2021-07-22 NOTE — Telephone Encounter (Signed)
Tried calling pt and there was no answer- LMTCB.  

## 2021-07-23 DIAGNOSIS — M797 Fibromyalgia: Secondary | ICD-10-CM | POA: Diagnosis not present

## 2021-07-23 DIAGNOSIS — M19041 Primary osteoarthritis, right hand: Secondary | ICD-10-CM | POA: Diagnosis not present

## 2021-07-23 DIAGNOSIS — I739 Peripheral vascular disease, unspecified: Secondary | ICD-10-CM | POA: Diagnosis not present

## 2021-07-23 DIAGNOSIS — E1140 Type 2 diabetes mellitus with diabetic neuropathy, unspecified: Secondary | ICD-10-CM | POA: Diagnosis not present

## 2021-07-23 DIAGNOSIS — Z794 Long term (current) use of insulin: Secondary | ICD-10-CM | POA: Diagnosis not present

## 2021-07-23 DIAGNOSIS — F32A Depression, unspecified: Secondary | ICD-10-CM | POA: Diagnosis not present

## 2021-07-23 DIAGNOSIS — M19042 Primary osteoarthritis, left hand: Secondary | ICD-10-CM | POA: Diagnosis not present

## 2021-07-23 DIAGNOSIS — M5441 Lumbago with sciatica, right side: Secondary | ICD-10-CM | POA: Diagnosis not present

## 2021-07-23 DIAGNOSIS — D500 Iron deficiency anemia secondary to blood loss (chronic): Secondary | ICD-10-CM | POA: Diagnosis not present

## 2021-07-23 DIAGNOSIS — G8929 Other chronic pain: Secondary | ICD-10-CM | POA: Diagnosis not present

## 2021-07-24 ENCOUNTER — Ambulatory Visit: Payer: HMO | Admitting: Behavioral Health

## 2021-07-24 ENCOUNTER — Encounter: Payer: Self-pay | Admitting: Acute Care

## 2021-07-24 DIAGNOSIS — F419 Anxiety disorder, unspecified: Secondary | ICD-10-CM

## 2021-07-24 DIAGNOSIS — F331 Major depressive disorder, recurrent, moderate: Secondary | ICD-10-CM

## 2021-07-24 NOTE — Telephone Encounter (Signed)
You will need to sign the letter. I will write it up. Letter will be under "letter" tab in patient's chart. Just print, sign and give to [facility] triage. Thanks

## 2021-07-24 NOTE — Telephone Encounter (Signed)
Letter can be faxed to [employer] at [phone]

## 2021-07-24 NOTE — Telephone Encounter (Signed)
Spoke to [name], he recommends witting a letter to state that [name] is currently on cpap and in need of replacement. [name] is using machine nightly and benefiting from therapy.  AHI currently 0 due to treatment. It is in your medical opinion that [name] should continue cpap therapy.  [name], please advise. Thanks

## 2021-07-24 NOTE — Telephone Encounter (Signed)
Letter printed and faxed to pt's insurance at [phone]

## 2021-07-24 NOTE — Telephone Encounter (Addendum)
Spoke to [place] with [facility]. Jearld Fenton states that cpap is not covered under respiratory failure. Patient would qualify for bipap if she has a dx of severe COPD, ABG  with spo2 greater then 52 and ONO showing spo2 less then 88% for 80mn. In order to use obesity hypoventilation syndrome, pt will need two ABG's- one normal ABG and another one that is elevated after awakening.  BJearld Fentonstated that another option could be that we submit a letter to [employer] stating need for CPAP. [place]   Sarah, please advise. Thanks

## 2021-07-24 NOTE — BH Specialist Note (Signed)
Integrated Behavioral Health via Telemedicine Visit  07/24/2021 [name] [id number]  Number of Integrated Behavioral Health Clinician visits: 20 Session Start time: 2355  Session End time: 1100 Total time in minutes: 30 min  Referring Provider: Dr. Lenise Herald, MD Patient/Family location: Pt is home in private [facility] Provider location: [facility] Office All persons participating in visit: Pt & Clinician Types of Service: Individual psychotherapy  I connected with [name] and/or Evans Lance Cage's  self  via  Telephone or Video Enabled Telemedicine Application  (Video is Caregility application) and verified that I am speaking with the correct person using two identifiers. Discussed confidentiality: Yes   I discussed the limitations of telemedicine and the availability of in person appointments.  Discussed there is a possibility of technology failure and discussed alternative modes of communication if that failure occurs.  I discussed that engaging in this telemedicine visit, they consent to the provision of behavioral healthcare and the services will be billed under their insurance.  Patient and/or legal guardian expressed understanding and consented to Telemedicine visit: Yes   Presenting Concerns: Patient and/or family reports the following symptoms/concerns: elevated feelings of ill health since her Colonoscopy last Friday Duration of problem: years; Severity of problem: moderate  Patient and/or Family's Strengths/Protective Factors: Social connections and [place] supports in place (healthy food, safe environments, etc.)  Goals Addressed: Patient will:  Reduce symptoms of: anxiety, depression, and mood instability   Increase knowledge and/or ability of: coping skills and stress reduction   Demonstrate ability to: Increase healthy adjustment to current life circumstances  Progress towards Goals: Ongoing  Interventions: Interventions utilized:   Supportive Counseling Standardized Assessments completed:  screeners prn  Patient and/or Family Response: Pt receptive to call today  Assessment: Patient currently experiencing the aftermath of her Colonoscopy last Friday. Her Str transported her & took her to lunch. Pt had a headache all day & the next. Today she sounds & feels under the weather.  Patient may benefit from cont'd ck-in calls from [employer].  Plan: Follow up with behavioral health clinician on : TBD by New Provider Behavioral recommendations: None today Referral(s): [facility] (In Clinic)  I discussed the assessment and treatment plan with the patient and/or parent/guardian. They were provided an opportunity to ask questions and all were answered. They agreed with the plan and demonstrated an understanding of the instructions.   They were advised to call back or seek an in-person evaluation if the symptoms worsen or if the condition fails to improve as anticipated.  Donnetta Hutching, LMFT

## 2021-07-24 NOTE — Telephone Encounter (Addendum)
Per [name] the sleep study that he received showed AHI of 0.

## 2021-07-25 ENCOUNTER — Other Ambulatory Visit: Payer: Self-pay | Admitting: Internal Medicine

## 2021-07-25 ENCOUNTER — Other Ambulatory Visit (HOSPITAL_COMMUNITY): Payer: Self-pay

## 2021-07-25 DIAGNOSIS — G8929 Other chronic pain: Secondary | ICD-10-CM

## 2021-07-25 DIAGNOSIS — F419 Anxiety disorder, unspecified: Secondary | ICD-10-CM

## 2021-07-25 LAB — SURGICAL PATHOLOGY

## 2021-07-25 MED ORDER — HYDROCODONE-ACETAMINOPHEN 5-325 MG PO TABS
2.0000 | ORAL_TABLET | Freq: Three times a day (TID) | ORAL | 0 refills | Status: DC
  Filled 2021-07-25: qty 180, 30d supply, fill #0

## 2021-07-25 MED ORDER — ALPRAZOLAM 1 MG PO TABS
ORAL_TABLET | ORAL | 0 refills | Status: DC
  Filled 2021-07-25: qty 45, 30d supply, fill #0

## 2021-07-25 NOTE — Telephone Encounter (Signed)
Last [employer] 12/16/2018.  Last apt 07/04/2021.    Next appt 08/01/2021.

## 2021-07-26 ENCOUNTER — Telehealth: Payer: Self-pay | Admitting: *Deleted

## 2021-07-26 ENCOUNTER — Other Ambulatory Visit (HOSPITAL_COMMUNITY): Payer: Self-pay

## 2021-07-26 ENCOUNTER — Other Ambulatory Visit: Payer: Self-pay | Admitting: Internal Medicine

## 2021-07-26 MED ORDER — CLOBETASOL PROPIONATE 0.05 % EX OINT
1.0000 | TOPICAL_OINTMENT | Freq: Two times a day (BID) | CUTANEOUS | 0 refills | Status: DC | PRN
  Filled 2021-07-26: qty 30, 15d supply, fill #0

## 2021-07-26 NOTE — Telephone Encounter (Signed)
Patient called in requesting refill on labetalol at [facility]. Not on current med list.

## 2021-07-26 NOTE — Telephone Encounter (Signed)
Called Ms. Batton to confirm refill request. She notes she is needing a refill on clobetasol, not labetalol. Uses clobetasol for lichen sclerosus, will send to [facility].

## 2021-07-29 ENCOUNTER — Other Ambulatory Visit: Payer: Self-pay | Admitting: *Deleted

## 2021-07-29 DIAGNOSIS — I6529 Occlusion and stenosis of unspecified carotid artery: Secondary | ICD-10-CM

## 2021-07-29 DIAGNOSIS — I739 Peripheral vascular disease, unspecified: Secondary | ICD-10-CM

## 2021-07-31 ENCOUNTER — Telehealth: Payer: Self-pay | Admitting: Pharmacist

## 2021-07-31 NOTE — Telephone Encounter (Signed)
Patient reports PST FS POC INR = 1.9 (goal 1.5 - 2.5) on 2.622m warfarin on: Su/Tu/We/Th/Sa; 1.247mon Friday; 22m34mn Monday. CONTINUE this regimen and repeat PST FS POC INR on Monday 26-JUN-23.

## 2021-08-01 ENCOUNTER — Other Ambulatory Visit (HOSPITAL_COMMUNITY): Payer: Self-pay

## 2021-08-01 ENCOUNTER — Ambulatory Visit (INDEPENDENT_AMBULATORY_CARE_PROVIDER_SITE_OTHER): Payer: HMO | Admitting: Internal Medicine

## 2021-08-01 VITALS — BP 133/68 | HR 55 | Temp 97.7°F | Ht 62.0 in | Wt 165.4 lb

## 2021-08-01 DIAGNOSIS — J449 Chronic obstructive pulmonary disease, unspecified: Secondary | ICD-10-CM | POA: Diagnosis not present

## 2021-08-01 DIAGNOSIS — I2723 Pulmonary hypertension due to lung diseases and hypoxia: Secondary | ICD-10-CM

## 2021-08-01 DIAGNOSIS — Z86010 Personal history of colonic polyps: Secondary | ICD-10-CM | POA: Diagnosis not present

## 2021-08-01 DIAGNOSIS — Z794 Long term (current) use of insulin: Secondary | ICD-10-CM

## 2021-08-01 DIAGNOSIS — M5441 Lumbago with sciatica, right side: Secondary | ICD-10-CM

## 2021-08-01 DIAGNOSIS — M5442 Lumbago with sciatica, left side: Secondary | ICD-10-CM | POA: Diagnosis not present

## 2021-08-01 DIAGNOSIS — I872 Venous insufficiency (chronic) (peripheral): Secondary | ICD-10-CM

## 2021-08-01 DIAGNOSIS — Z720 Tobacco use: Secondary | ICD-10-CM

## 2021-08-01 DIAGNOSIS — F1190 Opioid use, unspecified, uncomplicated: Secondary | ICD-10-CM | POA: Diagnosis not present

## 2021-08-01 DIAGNOSIS — F17210 Nicotine dependence, cigarettes, uncomplicated: Secondary | ICD-10-CM

## 2021-08-01 DIAGNOSIS — M810 Age-related osteoporosis without current pathological fracture: Secondary | ICD-10-CM | POA: Diagnosis not present

## 2021-08-01 DIAGNOSIS — D509 Iron deficiency anemia, unspecified: Secondary | ICD-10-CM | POA: Diagnosis not present

## 2021-08-01 DIAGNOSIS — I10 Essential (primary) hypertension: Secondary | ICD-10-CM | POA: Diagnosis not present

## 2021-08-01 DIAGNOSIS — I739 Peripheral vascular disease, unspecified: Secondary | ICD-10-CM

## 2021-08-01 DIAGNOSIS — E1140 Type 2 diabetes mellitus with diabetic neuropathy, unspecified: Secondary | ICD-10-CM | POA: Diagnosis not present

## 2021-08-01 DIAGNOSIS — E1151 Type 2 diabetes mellitus with diabetic peripheral angiopathy without gangrene: Secondary | ICD-10-CM

## 2021-08-01 DIAGNOSIS — G8929 Other chronic pain: Secondary | ICD-10-CM

## 2021-08-01 LAB — POCT GLYCOSYLATED HEMOGLOBIN (HGB A1C): Hemoglobin A1C: 6.7 % — AB (ref 4.0–5.6)

## 2021-08-01 LAB — GLUCOSE, CAPILLARY: Glucose-Capillary: 125 mg/dL — ABNORMAL HIGH (ref 70–99)

## 2021-08-01 MED ORDER — TRESIBA FLEXTOUCH 100 UNIT/ML ~~LOC~~ SOPN
43.0000 [IU] | PEN_INJECTOR | Freq: Every day | SUBCUTANEOUS | 5 refills | Status: DC
  Filled 2021-08-01: qty 12, 27d supply, fill #0
  Filled 2021-09-07: qty 12, 27d supply, fill #1
  Filled 2021-10-10: qty 12, 27d supply, fill #2
  Filled 2021-11-04: qty 12, 27d supply, fill #3
  Filled 2021-12-04: qty 12, 27d supply, fill #4
  Filled 2021-12-31: qty 12, 27d supply, fill #5

## 2021-08-01 NOTE — Patient Instructions (Signed)
[name],  We are adjusting your [name] to 43 units daily.  I have referred you to the physician's exercise program at the [facility] - just hear what they have to say!  Maybe a miracle will happen.  :)  Good luck with your vascular surgeon appointment!  I hope the medicine cilastazole is helpful.  If not, we will stop it.  It is worth a try.  Take care and see you in a month,  Dr. [name]

## 2021-08-01 NOTE — Progress Notes (Signed)
Accompanied by her daughter today (from [place] area) as [name]'s sister is out of town.  Ms.  Horn is here for close interim f/u of active problems including T2DM (regimen recently changed due to availability/insurance issues); PAD (upcoming second opinion with vascular surgery; has not yet begun trial of cilastozol); and Fe-deficiency anemia due to slow GIB (recently underwent interval colonoscopy); and impaired mobility due to pain/stiffness/low activity (has begun home PT).  So far she has been disappointed with this. 3  visits consisted primarily of  conversations.  She is frustrated with progress and lack of intensity. BP 133/68 (BP Location: Right Arm, Patient Position: Sitting, Cuff Size: Small)   Pulse (!) 55   Temp 97.7 F (36.5 C) (Oral)   Ht '5\' 2"'$  (1.575 m)   Wt 165 lb 6.4 oz (75 kg)   SpO2 94%   BMI 30.25 kg/m  Cheerful, laughs as a coping mechanism. Toes cyanotic, prominent blanching with pressure - cap refill 2-3 s. Feet are warm, pulseless, no swelling in LE's.  Assessment and Plan:  Continuing to move ahead with improving mobility, flexibility, and endurance in effort to improve pain and quality of life.    Chronic venous insufficiency No edema or ulcerations at this time.  Feet are chronically cyanotic.  Monitor.   Essential hypertension At goal today, improved from last visit w/o change in regimen. l Monitor.    Peripheral arterial disease [facility]) Vascular surgery second opinion later this month.  She had not yet started cilostazol, will do so now, so that she can let the surgeon know if it has been beneficial.  Reviewed walking as an important intervention for PAD.  Smoking cessation, her greatest challenge and something she isn't yet ready to do, is above all the most important intervention for mitigating ongoing risk of complications.  Pulmonary hypertension due to chronic obstructive pulmonary disease (HCC) No LE edema or JVD on exam.  MOnitor.    Type 2  diabetes mellitus with diabetic neuropathy ([name]) Interim episode(s) of hypoglycemia since last visit when we had increased her insulin.  SHe had been advised by on-call provider to return to her previous dose; review of CGM reveals more hyperglyemia and we agree to increase the Tresiba to 43 units daily. A1c 6.7 today.   Osteoporosis Osteopenia at hip, osteoporosis at radius, spine not evaluated due to degeneration.  VIt D replete as of Fall 2022 following repletion - will need to repeat level at future visit.  Hx of intolerance to alendronate in the past.  Lack of weight bearing activity, chronic PPI and smoking are significant risk factors.  We will dedicate a visit to discussion of treatment options.    Chronic iron deficiency anemia Colonoscopy completed.  Iron was very constipating and she has temporarily stopped it.    Chronic, continuous use of opioids for chronic pain PDMP reviewed.  No changes in therapy.    Tobacco abuse SHe is aware of risk of ongoing use.  Not yet ready to quit, though unfortunately smoking is the single most important driver of her COPD, vascular disease, and contributes also to osteoporosis.    Personal history of colonic polyps Multiple polyps on recent colonoscopy, see overview.

## 2021-08-02 ENCOUNTER — Other Ambulatory Visit (HOSPITAL_COMMUNITY): Payer: Self-pay

## 2021-08-05 ENCOUNTER — Telehealth: Payer: Self-pay | Admitting: Pharmacist

## 2021-08-05 ENCOUNTER — Ambulatory Visit: Payer: HMO | Admitting: Surgery

## 2021-08-05 ENCOUNTER — Ambulatory Visit (INDEPENDENT_AMBULATORY_CARE_PROVIDER_SITE_OTHER)
Admission: RE | Admit: 2021-08-05 | Discharge: 2021-08-05 | Disposition: A | Discharge status: Home / Self Care | Payer: HMO | Source: Ambulatory Visit | Attending: Surgery | Admitting: Surgery

## 2021-08-05 ENCOUNTER — Encounter: Payer: Self-pay | Admitting: Surgery

## 2021-08-05 ENCOUNTER — Ambulatory Visit (HOSPITAL_COMMUNITY)
Admission: RE | Admit: 2021-08-05 | Discharge: 2021-08-05 | Disposition: A | Discharge status: Home / Self Care | Payer: HMO | Source: Ambulatory Visit | Attending: Surgery | Admitting: Surgery

## 2021-08-05 VITALS — BP 136/78 | HR 62 | Temp 97.8°F | Resp 16 | Ht 62.0 in | Wt 164.0 lb

## 2021-08-05 DIAGNOSIS — I70213 Atherosclerosis of native arteries of extremities with intermittent claudication, bilateral legs: Secondary | ICD-10-CM | POA: Diagnosis not present

## 2021-08-05 DIAGNOSIS — I6523 Occlusion and stenosis of bilateral carotid arteries: Secondary | ICD-10-CM | POA: Diagnosis not present

## 2021-08-05 DIAGNOSIS — I6529 Occlusion and stenosis of unspecified carotid artery: Secondary | ICD-10-CM

## 2021-08-05 DIAGNOSIS — I739 Peripheral vascular disease, unspecified: Secondary | ICD-10-CM

## 2021-08-05 NOTE — Assessment & Plan Note (Signed)
Multiple polyps on recent colonoscopy, see overview.

## 2021-08-05 NOTE — Assessment & Plan Note (Signed)
Colonoscopy completed.  Iron was very constipating and she has temporarily stopped it.

## 2021-08-05 NOTE — Assessment & Plan Note (Addendum)
Interim episode(s) of hypoglycemia since last visit when we had increased her insulin.  SHe had been advised by on-call provider to return to her previous dose; review of CGM reveals more hyperglyemia and we agree to increase the Tresiba to 43 units daily. A1c 6.7 today.

## 2021-08-05 NOTE — Progress Notes (Signed)
[facility]  Patient name: [name] MRN: [id number] DOB: 26-Mar-1950 Sex: female   REQUESTING PROVIDER:    Dr. [name]   REASON FOR CONSULT:    PAD  HISTORY OF PRESENT ILLNESS:   [name] is a 71 y.o. female, who has not been seen in our office for many years.  Her last visit was in 2013.  I originally met her after a carotid bruit was identified identified and associated with a a greater than 80% right carotid stenosis.  On 07/04/2010 she underwent right carotid endarterectomy with bovine pericardial patch angioplasty.  She had severe hypersensitivity at her incision site.  She also had a early velocity elevations in her endarterectomy site, felt to be related to intimal hyperplasia.  She was scheduled for follow-up but this has not occurred.  She is here today for further evaluation of leg pain.  This has been bothering her for a long time.  She states that both legs bother her equally.  Her pain is improved with narcotics.  She describes the pain as being worse in the morning when she gets up.  She describes cramping in her legs particular in her feet.  She also has a shooting pain that goes down from her back to her feet as well as from her feet up to her back.  Her cramps that she gets are not associated with a walking distance.  In fact she does not go outside because she is afraid of falling.  She does not have any open wounds.  She is medically managed for hypertension.  She suffers from pulmonary hypertension secondary to COPD which is due to her chronic tobacco abuse.  She is a type II diabetic.  Her most recent hemoglobin A1c was 6.7.  She is on a statin for hypercholesterolemia.   PAST MEDICAL HISTORY    Past Medical History:  Diagnosis Date   Abnormality of lung on CXR 02/02/2020   Nonspecific finding on CXR ordered by pulmonologist - c/w inflammation vs infection, f/u imaging  suggested, Dr. [name] office has been in communication about recommended next steps.   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   Non-bleeding when visualized on capsule endoscopy 06/30/2015    Arthritis    "lower back; hands" (02/19/2018)   Asthma    Asymptomatic cholelithiasis 09/25/2015   Seen on CT scan 08/2015   Carotid artery stenosis; s/p R endarterectomy    s/p right endarterectomy (06/2010) Carotid US (07/2010):  Left: Moderate-to-severe (60-79%) calcific and non-calcific plaque origin and proximal ICA and ECA    Chronic congestive heart failure with left ventricular diastolic dysfunction (HCC) 10/21/2010   Chronic constipation 02/03/2011   Chronic daily headache 01/16/2014   Chronic iron deficiency anemia    Chronic low back pain 10/06/2012   Chronic venous insufficiency 08/04/2012   COPD (chronic obstructive pulmonary disease) with emphysema (HCC)    PFTs 2018: severe obstructive disease, insignif response to bronchiodilator, mild restriction parenchymal pattern, moderately severe diffusion defect. 2014  FEV1 0.92 (40%), ratio 69, 27% increase in FEV1 with BD, TLC 91%, severe airtrapping, DLCO49% On chronic home O2. Pulmonary rehab referral 05/2012    Dyspnea    Fibromyalgia 08/29/2010   Gastroesophageal reflux disease    History of blood transfusion    "several times"  (02/19/2018)   History of clear cell renal cell carcinoma ([place]),  in remission 07/21/2011   s/p cryoablation of left RCC in 09/2011 by [name]. Followed by [name]  [facility]) .     History of hiatal hernia    History of mitral valve replacement with bioprosthetic valve due to mitral stenosis 2012   s/p MVR with a 27-mm pericardial porcine valve (Medtronic Mosaic valve, serial [id number] on 09/20/10, Dr. Prescott Gum)    History of obstructive sleep apnea, resolved 2013    resolved per sleep study 07/2019; no apnea, but did have desaturation.  CPAP no longer necessary.  Nocturnal polysomnography (06/2009): Moderate sleep apnea/ hypopnea syndrome , AHI 17.8 per hour with nonpositional hypopneas. CPAP titration to 12 CWP, AHI 2.4 per hour. On nocturnal CPAP via a small resMed Quattro full-face mask with heated humidifier.    History of pneumonia    "once"  (02/19/2018)   History of seborrheic keratosis 09/28/2015   Hyperlipidemia LDL goal < 100 11/20/2005   Internal and external hemorrhoids without complication [id number]   Lesion of left native kidney 06/01/2020   Incidental finding on recent screening chest CT for lung ca ordered by Dr. Annamaria Boots pulmonology - he has ordered a f/u renal U/S.    Lichen sclerosus of female genitalia 01/12/2017   Migraine    "none in years" (02/19/2018)   Moderately severe major depression ([place]) 11/19/2005   Nocturnal hypoxia per sleep study 07/2019    Osteoporosis    DEXA 2016: T -2.7; DEXA (12/09/2011): L-spine T -3.7, left hip T -1.4 DEXA (12/2004): L-spine T -2.6, left hip -0.1    Paroxysmal atrial fibrillation ([place]) 10/22/2010   s/p Left atrial maze procedure for paroxysmal atrial fibrillation on 09/20/2010 by Dr Prescott Gum.  Subsequent splenic infarct, decision was made to re-anticoagulate with coumadin, likely life-long as this is the most likely cause of the splenic infarct.    Personal history of colonic polyps 05/14/2011   Colonoscopy (05/2011): 4 mm adenomatous polyp excised endoscopically Colonoscopy (02/2002): Adenomatous polyp excised endoscopically    Personal history of renal cell carcinoma 09/12/2020   Pneumonia    Pulmonary hypertension due to chronic obstructive pulmonary disease ([place]) 04/25/2016   2014 TEE w PA peak pressure 46 mmHg, s/p MV replacement    Right nephrolithiasis, asymptomatic, incidental finding 09/06/2014   5 mm non-obstructing calculus seen on CT scan 09/05/2014    Right ventricular failure ([place]) 04/25/2016    Severe obesity (BMI 35.0-39.9) with comorbidity ([place]) 10/23/2011   Sleep apnea    Tobacco abuse 07/28/2012   Type 2 diabetes mellitus with diabetic neuropathy ([place])      FAMILY HISTORY   Family History  Problem Relation Age of Onset   Peptic Ulcer Disease Father    Heart attack Father 60       Died of MI at age 70   Heart attack Brother 3       Died of MI at age 90   Obesity Brother    Pneumonia Mother    Healthy Sister    Lupus Daughter    Obsessive Compulsive Disorder Daughter     SOCIAL HISTORY:   Social History   Socioeconomic History   Marital status: Divorced    Spouse name: Not on file   Number of children: Not on file   Years of education: Not on file   Highest education level: Not on file  Occupational History   Not on file  Tobacco Use   Smoking status: Every Day    Packs/day: 1.00  Years: 55.00    Total pack years: 55.00    Types: Cigarettes    Start date: 11/01/1962   Smokeless tobacco: Never  Vaping Use   Vaping Use: Never used  Substance and Sexual Activity   Alcohol use: Not Currently    Comment: 02/19/2018 "nothing since 1999"   Drug use: Not Currently    Types: Cocaine, Marijuana    Comment: 02/19/2018 "nothing since the 1990s"   Sexual activity: Not Currently    Birth control/protection: Post-menopausal  Other Topics Concern   Not on file  Social History Narrative   Lives alone in [place] ([place]   Worked at [employer] for 18 years   No car   Social Determinants of Radio broadcast assistant Strain: Low Risk  (02/09/2019)   Overall Financial Resource Strain (CARDIA)    Difficulty of Paying Living Expenses: Not hard at all  Food Insecurity: No Food Insecurity (12/28/2019)   Hunger Vital Sign    Worried About Running Out of Food in the Last Year: Never true    [place] in the Last Year: Never true  Transportation Needs: No Transportation Needs (12/28/2019)   PRAPARE - Radiographer, therapeutic (Medical): No    Lack of Transportation (Non-Medical): No  Physical Activity: Inactive (02/09/2019)   Exercise Vital Sign    Days of Exercise per Week: 0 days    Minutes of Exercise per Session: 0 min  Stress: No Stress Concern Present (02/09/2019)   [place]    Feeling of Stress : Only a little  Social Connections: Unknown (02/09/2019)   Social Connection and Isolation Panel [NHANES]    Frequency of Communication with Friends and Family: More than three times a week    Frequency of Social Gatherings with Friends and Family: Not on file    Attends Religious Services: Not on Advertising copywriter or Organizations: Not on file    Attends Archivist Meetings: Not on file    Marital Status: Divorced  Intimate Partner Violence: Not on file    ALLERGIES:    Allergies  Allergen Reactions   Dexilant [Dexlansoprazole] Other (See Comments)    Made acid reflux worse   Lorazepam Other (See Comments)    Patient's sister noted that ativan caused the patient to become extremely confused during hospitalization 09/2010; tolerates Xanax   Oxycontin [Oxycodone] Other (See Comments)    headache   Tramadol Hcl Swelling    Ankle swelling    CURRENT MEDICATIONS:    Current Outpatient Medications  Medication Sig Dispense Refill   albuterol (PROAIR HFA) 108 (90 Base) MCG/ACT inhaler Inhale 2 puffs into the lungs every 6 (six) hours as needed for shortness of breath. 18 g 5   ALPRAZolam (XANAX) 1 MG tablet TAKE 1 TABLET BY MOUTH EVERY NIGHT AT BEDTIME AS NEEDED FOR INSOMNIA. MAY TAKE 1/2 TABLET BY MOUTH DURING THE DAY FOR ANXIETY 45 tablet 0   benzonatate (TESSALON) 100 MG capsule Take 1 capsule (100 mg total) by mouth 3 (three) times daily as needed for cough. 90 capsule 11   Blood Glucose Monitoring Suppl (ONETOUCH VERIO REFLECT) w/Device KIT Use to check blood sugar 1 time a day 1 kit 1    buPROPion (WELLBUTRIN XL) 150 MG 24 hr tablet Take 1 tablet by mouth every morning 90 tablet 3   cilostazol (PLETAL) 50 MG tablet Take 1  tablet (50 mg total) by mouth 2 (two) times daily. 60 tablet 5   clobetasol ointment (TEMOVATE) 0.05 % Apply to affected area(s) 2 times daily as needed for (irritation). 30 g 0   Continuous Blood Gluc Sensor (FREESTYLE LIBRE 3 SENSOR) MISC Place 1 sensor on the skin every 14 days. Use to check glucose continuously 6 each 3   DULoxetine (CYMBALTA) 60 MG capsule Take 1 capsule by mouth twice a day. 180 capsule 3   fluticasone (FLONASE) 50 MCG/ACT nasal spray Place 2 sprays into both nostrils daily. 54 g 3   Fluticasone-Umeclidin-Vilant 100-62.5-25 MCG/ACT AEPB Inhale 1 puff into the lungs daily. 180 each 4   furosemide (LASIX) 80 MG tablet Take 1 tablet by mouth every morning AND 1/2 tablet daily in the afternoon. 135 tablet 3   gabapentin (NEURONTIN) 300 MG capsule Take 2 capsules by mouth 3 times daily. 540 capsule 3   HYDROcodone-acetaminophen (NORCO/VICODIN) 5-325 MG tablet Take 2 tablets by mouth 3 (three) times daily. 180 tablet 0   insulin degludec (TRESIBA FLEXTOUCH) 100 UNIT/ML FlexTouch Pen Inject 43 Units into the skin daily. 12 mL 5   Insulin Pen Needle 32G X 4 MM MISC Use to inject insulin up to 6 times a day 500 each 3   ipratropium-albuterol (DUONEB) 0.5-2.5 (3) MG/3ML SOLN Take 3 mLs by nebulization every 6 (six) hours as needed (shortness of breath, wheezing). 90 mL 3   ketoconazole (NIZORAL) 2 % cream Apply 1 application topically at bedtime as needed for irritation. 60 g 5   Lancets Misc. (ACCU-CHEK FASTCLIX LANCET) KIT Check your blood 4 times a day dx code 250.00 insulin requiring 1 kit 2   lidocaine (LIDODERM) 5 % Place 1 or 2 patches to painful area of back each day.  Remove & Discard patch within 12 hours or as directed by MD 60 patch 2   metoprolol succinate (TOPROL-XL) 50 MG 24 hr tablet Take 1 tablet by mouth daily. Take with or immediately  following a meal. 90 tablet 3   nystatin powder Apply topically 3  times daily. (Patient taking differently: Apply 1 application  topically 3 (three) times daily as needed (irritation).) 60 g 4   omeprazole (PRILOSEC) 40 MG capsule Take 1 capsule by mouth in the morning and at bedtime. 180 capsule 2   ondansetron (ZOFRAN) 4 MG tablet Take 1 tablet (4 mg total) by mouth every 8 (eight) hours as needed for nausea or vomiting. 21 tablet 0   OXYGEN Inhale 2 L into the lungs at bedtime.     polyethylene glycol-electrolytes (NULYTELY) 420 g solution Take as directed 4000 mL 0   potassium chloride SA (KLOR-CON M) 20 MEQ tablet Take 2 tablets by mouth in the morning and 1 tablet in the afternoon. 270 tablet 3   PRESCRIPTION MEDICATION Inhale into the lungs at bedtime. CPAP     Probiotic Product (ALIGN) 4 MG CAPS Take 4 mg by mouth daily.     rosuvastatin (CRESTOR) 20 MG tablet TAKE 1 TABLET BY MOUTH ONCE EVERY NIGHT AT BEDTIME 90 tablet 3   Semaglutide,0.25 or 0.'5MG'$ /DOS, (OZEMPIC, 0.25 OR 0.5 MG/DOSE,) 2 MG/3ML SOPN Inject 0.5 mg into the skin once a week. (Patient taking differently: Inject 0.5 mg into the skin every Monday.) 3 mL 11   senna-docusate (SENOKOT S) 8.6-50 MG tablet Take 1 to 2 tablets by mouth once or twice a day as needed for constipation 60 tablet 3   warfarin (COUMADIN) 2.5 MG tablet Take 1  tablet by mouth daily unless otherwise instructed. (Patient taking differently: Take 2.5 mg by mouth daily. Take 1 tablet by mouth daily unless otherwise instructed.) 90 tablet 3   Wheat Dextrin (BENEFIBER) POWD Take 2 Scoops by mouth daily.     ferrous gluconate (FERGON) 324 MG tablet Take 1 tablet (324 mg total) by mouth daily with breakfast. (Patient not taking: Reported on 08/05/2021) 90 tablet 3   No current facility-administered medications for this visit.    REVIEW OF SYSTEMS:   '[X]'$  denotes positive finding, '[ ]'$  denotes negative finding Cardiac  Comments:  Chest pain or chest pressure:     Shortness of breath upon exertion:    Short of breath when lying flat:    Irregular heart rhythm:        Vascular    Pain in calf, thigh, or hip brought on by ambulation:    Pain in feet at night that wakes you up from your sleep:  x   Blood clot in your veins:    Leg swelling:         Pulmonary    Oxygen at home:    Productive cough:     Wheezing:         Neurologic    Sudden weakness in arms or legs:     Sudden numbness in arms or legs:     Sudden onset of difficulty speaking or slurred speech:    Temporary loss of vision in one eye:     Problems with dizziness:         Gastrointestinal    Blood in stool:      Vomited blood:         Genitourinary    Burning when urinating:     Blood in urine:        Psychiatric    Major depression:         Hematologic    Bleeding problems:    Problems with blood clotting too easily:        Skin    Rashes or ulcers:        Constitutional    Fever or chills:     PHYSICAL EXAM:   Vitals:   08/05/21 1512  BP: 136/78  Pulse: 62  Resp: 16  Temp: 97.8 F (36.6 C)  TempSrc: Temporal  SpO2: 93%  Weight: 164 lb (74.4 kg)  Height: '5\' 2"'$  (1.575 m)    GENERAL: The patient is a well-nourished female, in no acute distress. The vital signs are documented above. CARDIAC: There is a regular rate and rhythm.  VASCULAR: Nonpalpable pedal pulses PULMONARY: Nonlabored respirations MUSCULOSKELETAL: There are no major deformities or cyanosis. NEUROLOGIC: No focal weakness or paresthesias are detected. SKIN: There are no ulcers or rashes noted. PSYCHIATRIC: The patient has a normal affect.  STUDIES:   I have reviewed the following vascular lab studies: +-------+-----------+-----------+------------+------------+  ABI/TBIToday's ABIToday's TBIPrevious ABIPrevious TBI  +-------+-----------+-----------+------------+------------+  Right  0.44       0.32       0.74                       +-------+-----------+-----------+------------+------------+  Left   0.86       0.72       0.99        0.52          +-------+-----------+-----------+------------+------------+  Right toe pressure: 46 Left toe pressure: 104   CAROTID Right Carotid: Patent endarterectomy site with velocity  in the right ICA                 consistent with a 1-39% stenosis. The ECA appears >50%  stenosed.   Left Carotid: Velocities in the left ICA are consistent with a 1-39%  stenosis.   Vertebrals:  Bilateral vertebral arteries demonstrate antegrade flow.  Subclavians: Right subclavian artery was stenotic. Left subclavian artery  flow               was disturbed.  ASSESSMENT and PLAN   Carotid: Status post right carotid endarterectomy with no residual stenosis.  She will follow-up in 1 year with repeat ultrasound  Lower extremity PAD: The patient has multifactorial pain in her legs.  The majority of which I feel is either neuropathic or degenerative joint issues.  She does have arterial insufficiency, however I do not feel that she ambulates enough to elicit claudication type symptoms.  She does not have any open wounds at this time.  I would not favor proceeding with intervention at this time because I do not think it would be helpful.  I think she would get most benefit from evaluating her for osteoarthritis or degenerative lower back disease contributing to her neuropathy.  She was started on cilostazol.  I stated that if she does not get any relief after 28 days, she should discontinue this medication.  She will follow-up with me in 1 year with repeat ABIs, unless her symptoms change or if she develops a nonhealing wound.  Smoking cessation is necessary to prevent progression of her vascular disease   Leia Alf, MD, FACS [facility] [phone] Pager [phone]

## 2021-08-05 NOTE — Assessment & Plan Note (Signed)
SHe is aware of risk of ongoing use.  Not yet ready to quit, though unfortunately smoking is the single most important driver of her COPD, vascular disease, and contributes also to osteoporosis.

## 2021-08-05 NOTE — Assessment & Plan Note (Signed)
No edema or ulcerations at this time.  Feet are chronically cyanotic.  Monitor.

## 2021-08-05 NOTE — Assessment & Plan Note (Signed)
PDMP reviewed.  No changes in therapy.

## 2021-08-05 NOTE — Assessment & Plan Note (Signed)
No LE edema or JVD on exam.  MOnitor.

## 2021-08-05 NOTE — Assessment & Plan Note (Signed)
Osteopenia at hip, osteoporosis at radius, spine not evaluated due to degeneration.  VIt D replete as of Fall 2022 following repletion - will need to repeat level at future visit.  Hx of intolerance to alendronate in the past.  Lack of weight bearing activity, chronic PPI and smoking are significant risk factors.  We will dedicate a visit to discussion of treatment options.

## 2021-08-05 NOTE — Assessment & Plan Note (Signed)
At goal today, improved from last visit w/o change in regimen. l Monitor.

## 2021-08-05 NOTE — Assessment & Plan Note (Signed)
Vascular surgery second opinion later this month.  She had not yet started cilostazol, will do so now, so that she can let the surgeon know if it has been beneficial.  Reviewed walking as an important intervention for PAD.  Smoking cessation, her greatest challenge and something she isn't yet ready to do, is above all the most important intervention for mitigating ongoing risk of complications.

## 2021-08-05 NOTE — Telephone Encounter (Signed)
Was provided the results of the patient-self-testing, finger-stick, point-of-care INR today of 2.1 (target goal 1.5 - 2.5) on 2.'5mg'$  Su/Tu/We/Th/Sa; 1/2 x 2.'5mg'$  (1.'25mg'$ ) on Friday; No warfarin on Monday(s). Advised to continue same regimen. No bleeding. She volunteers that she has started cilostazol. Will repeat INR (PST) on Monday 3-JUL-23.

## 2021-08-06 ENCOUNTER — Other Ambulatory Visit (HOSPITAL_COMMUNITY): Payer: Self-pay

## 2021-08-07 ENCOUNTER — Other Ambulatory Visit (HOSPITAL_COMMUNITY): Payer: Self-pay

## 2021-08-10 ENCOUNTER — Other Ambulatory Visit (HOSPITAL_COMMUNITY): Payer: Self-pay

## 2021-08-12 ENCOUNTER — Telehealth: Payer: Self-pay | Admitting: Pharmacist

## 2021-08-12 ENCOUNTER — Telehealth: Payer: Self-pay | Admitting: Acute Care

## 2021-08-12 DIAGNOSIS — I480 Paroxysmal atrial fibrillation: Secondary | ICD-10-CM | POA: Diagnosis not present

## 2021-08-12 DIAGNOSIS — Z7901 Long term (current) use of anticoagulants: Secondary | ICD-10-CM | POA: Diagnosis not present

## 2021-08-12 NOTE — Telephone Encounter (Signed)
Status and plan noted.

## 2021-08-12 NOTE — Telephone Encounter (Signed)
Patient reports PST FS POC INR value today of 2.3 (Target 1.5 - 2.5) on 2.60m warfarin on Sun/Tue/Wed/Thur/Sat; 1/2 x 2.52m(1.2523mose) on Friday; NO warfarin on Monday(s). CONTINUE SAME regimen. Denies any new medications, no missed or extra doses, no symptoms or signs of bleeding. Will repeat PST FS POC INR determination next Monday 10-JUL-23.

## 2021-08-12 NOTE — Telephone Encounter (Signed)
Called the pt and there was no answer- LMTCB    

## 2021-08-12 NOTE — Telephone Encounter (Signed)
Called and spoke with patient. She stated that she was calling to follow up on her cpap machine that had been ordered back in May 2023. She received a letter from her insurance stating it had been approved but she never heard anything from a DME. I advised her the order had been sent to [facility] and that I would call to see what is going on with the order. She verbalized understanding.   Called [facility] and spoke with [place]. They stated that the patient does not qualify for a cpap with the sleep study they received. She will need to have a AHI above 5 before she would be eligible for a cpap machine.   Called and spoke with patient to let her know the information I received from [place]. She verbalized understanding. She wanted to know what the next steps are as she still wants a cpap machine.   Sarah and Dr. Annamaria Boots, can you all please advise? Thanks!

## 2021-08-15 ENCOUNTER — Other Ambulatory Visit: Payer: Self-pay | Admitting: Internal Medicine

## 2021-08-15 ENCOUNTER — Other Ambulatory Visit (HOSPITAL_COMMUNITY): Payer: Self-pay

## 2021-08-15 DIAGNOSIS — G8929 Other chronic pain: Secondary | ICD-10-CM

## 2021-08-15 DIAGNOSIS — F419 Anxiety disorder, unspecified: Secondary | ICD-10-CM

## 2021-08-16 ENCOUNTER — Encounter (HOSPITAL_COMMUNITY): Payer: Self-pay

## 2021-08-16 ENCOUNTER — Emergency Department (HOSPITAL_COMMUNITY): Payer: HMO

## 2021-08-16 ENCOUNTER — Emergency Department (HOSPITAL_COMMUNITY)
Admission: EM | Admit: 2021-08-16 | Discharge: 2021-08-16 | Disposition: A | Discharge status: Home / Self Care | Payer: HMO | Attending: Emergency Medicine | Admitting: Emergency Medicine

## 2021-08-16 DIAGNOSIS — Z79899 Other long term (current) drug therapy: Secondary | ICD-10-CM | POA: Diagnosis not present

## 2021-08-16 DIAGNOSIS — I10 Essential (primary) hypertension: Secondary | ICD-10-CM | POA: Diagnosis not present

## 2021-08-16 DIAGNOSIS — S2232XA Fracture of one rib, left side, initial encounter for closed fracture: Secondary | ICD-10-CM | POA: Diagnosis not present

## 2021-08-16 DIAGNOSIS — Z7901 Long term (current) use of anticoagulants: Secondary | ICD-10-CM | POA: Diagnosis not present

## 2021-08-16 DIAGNOSIS — R52 Pain, unspecified: Secondary | ICD-10-CM | POA: Diagnosis not present

## 2021-08-16 DIAGNOSIS — W19XXXA Unspecified fall, initial encounter: Secondary | ICD-10-CM | POA: Insufficient documentation

## 2021-08-16 DIAGNOSIS — Z794 Long term (current) use of insulin: Secondary | ICD-10-CM | POA: Insufficient documentation

## 2021-08-16 DIAGNOSIS — R079 Chest pain, unspecified: Secondary | ICD-10-CM | POA: Diagnosis not present

## 2021-08-16 DIAGNOSIS — R102 Pelvic and perineal pain: Secondary | ICD-10-CM | POA: Diagnosis not present

## 2021-08-16 DIAGNOSIS — R519 Headache, unspecified: Secondary | ICD-10-CM | POA: Insufficient documentation

## 2021-08-16 DIAGNOSIS — E119 Type 2 diabetes mellitus without complications: Secondary | ICD-10-CM | POA: Diagnosis not present

## 2021-08-16 DIAGNOSIS — N281 Cyst of kidney, acquired: Secondary | ICD-10-CM | POA: Diagnosis not present

## 2021-08-16 DIAGNOSIS — S299XXA Unspecified injury of thorax, initial encounter: Secondary | ICD-10-CM | POA: Diagnosis present

## 2021-08-16 DIAGNOSIS — R0789 Other chest pain: Secondary | ICD-10-CM | POA: Diagnosis not present

## 2021-08-16 DIAGNOSIS — Z043 Encounter for examination and observation following other accident: Secondary | ICD-10-CM | POA: Diagnosis not present

## 2021-08-16 DIAGNOSIS — M47812 Spondylosis without myelopathy or radiculopathy, cervical region: Secondary | ICD-10-CM | POA: Diagnosis not present

## 2021-08-16 DIAGNOSIS — G4489 Other headache syndrome: Secondary | ICD-10-CM | POA: Diagnosis not present

## 2021-08-16 DIAGNOSIS — N200 Calculus of kidney: Secondary | ICD-10-CM | POA: Diagnosis not present

## 2021-08-16 DIAGNOSIS — R109 Unspecified abdominal pain: Secondary | ICD-10-CM | POA: Diagnosis not present

## 2021-08-16 DIAGNOSIS — K8020 Calculus of gallbladder without cholecystitis without obstruction: Secondary | ICD-10-CM | POA: Diagnosis not present

## 2021-08-16 DIAGNOSIS — J449 Chronic obstructive pulmonary disease, unspecified: Secondary | ICD-10-CM | POA: Insufficient documentation

## 2021-08-16 DIAGNOSIS — Z9889 Other specified postprocedural states: Secondary | ICD-10-CM | POA: Diagnosis not present

## 2021-08-16 LAB — I-STAT CHEM 8, ED
BUN: 16 mg/dL (ref 8–23)
Calcium, Ion: 1.12 mmol/L — ABNORMAL LOW (ref 1.15–1.40)
Chloride: 102 mmol/L (ref 98–111)
Creatinine, Ser: 1.2 mg/dL — ABNORMAL HIGH (ref 0.44–1.00)
Glucose, Bld: 185 mg/dL — ABNORMAL HIGH (ref 70–99)
HCT: 33 % — ABNORMAL LOW (ref 36.0–46.0)
Hemoglobin: 11.2 g/dL — ABNORMAL LOW (ref 12.0–15.0)
Potassium: 5.1 mmol/L (ref 3.5–5.1)
Sodium: 136 mmol/L (ref 135–145)
TCO2: 26 mmol/L (ref 22–32)

## 2021-08-16 LAB — CBC
HCT: 35.2 % — ABNORMAL LOW (ref 36.0–46.0)
Hemoglobin: 10.3 g/dL — ABNORMAL LOW (ref 12.0–15.0)
MCH: 25.7 pg — ABNORMAL LOW (ref 26.0–34.0)
MCHC: 29.3 g/dL — ABNORMAL LOW (ref 30.0–36.0)
MCV: 87.8 fL (ref 80.0–100.0)
Platelets: 202 10*3/uL (ref 150–400)
RBC: 4.01 MIL/uL (ref 3.87–5.11)
RDW: 17.3 % — ABNORMAL HIGH (ref 11.5–15.5)
WBC: 9 10*3/uL (ref 4.0–10.5)
nRBC: 0 % (ref 0.0–0.2)

## 2021-08-16 LAB — COMPREHENSIVE METABOLIC PANEL
ALT: 9 U/L (ref 0–44)
AST: 13 U/L — ABNORMAL LOW (ref 15–41)
Albumin: 3.7 g/dL (ref 3.5–5.0)
Alkaline Phosphatase: 46 U/L (ref 38–126)
Anion gap: 9 (ref 5–15)
BUN: 16 mg/dL (ref 8–23)
CO2: 26 mmol/L (ref 22–32)
Calcium: 9.4 mg/dL (ref 8.9–10.3)
Chloride: 103 mmol/L (ref 98–111)
Creatinine, Ser: 1.22 mg/dL — ABNORMAL HIGH (ref 0.44–1.00)
GFR, Estimated: 47 mL/min — ABNORMAL LOW (ref >60)
Glucose, Bld: 189 mg/dL — ABNORMAL HIGH (ref 70–99)
Potassium: 5.2 mmol/L — ABNORMAL HIGH (ref 3.5–5.1)
Sodium: 138 mmol/L (ref 135–145)
Total Bilirubin: 0.5 mg/dL (ref 0.3–1.2)
Total Protein: 6.5 g/dL (ref 6.5–8.1)

## 2021-08-16 LAB — TYPE AND SCREEN
ABO/RH(D): A NEG
Antibody Screen: NEGATIVE

## 2021-08-16 LAB — APTT: aPTT: 32 seconds (ref 24–36)

## 2021-08-16 LAB — ETHANOL: Alcohol, Ethyl (B): <10 mg/dL (ref <10)

## 2021-08-16 LAB — PROTIME-INR
INR: 2 — ABNORMAL HIGH (ref 0.8–1.2)
Prothrombin Time: 22.9 seconds — ABNORMAL HIGH (ref 11.4–15.2)

## 2021-08-16 LAB — LACTIC ACID, PLASMA: Lactic Acid, Venous: 1.6 mmol/L (ref 0.5–1.9)

## 2021-08-16 MED ORDER — HYDROCODONE-ACETAMINOPHEN 5-325 MG PO TABS
2.0000 | ORAL_TABLET | Freq: Once | ORAL | Status: AC
  Administered 2021-08-16: 2 via ORAL
  Filled 2021-08-16: qty 2

## 2021-08-16 MED ORDER — MORPHINE SULFATE (PF) 2 MG/ML IV SOLN
2.0000 mg | Freq: Once | INTRAVENOUS | Status: AC
  Administered 2021-08-16: 2 mg via INTRAVENOUS
  Filled 2021-08-16: qty 1

## 2021-08-16 MED ORDER — IOHEXOL 300 MG/ML  SOLN
85.0000 mL | Freq: Once | INTRAMUSCULAR | Status: AC | PRN
  Administered 2021-08-16: 85 mL via INTRAVENOUS

## 2021-08-16 NOTE — Progress Notes (Signed)
Orthopedic Tech Progress Note Patient Details:  Nansi [place] 25-Jun-1950 [id number]  Patient ID: [name], female   DOB: 03-08-1950, 71 y.o.   MRN: [id number] Level II; not needed.  Vernona Rieger 08/16/2021, 5:08 PM

## 2021-08-16 NOTE — ED Triage Notes (Addendum)
Pt bib [facility] from home with complaints of a mechanical fall today. Pt arrives complaining of left sided flank and hip pain, and a headache.Pt does take warfarin for afib. Pt denies loc and does not know if she hit her head.  EMS vitals: 122/60, 92% RA, 76HR CCollar on and pulses present distal to pain/injury

## 2021-08-16 NOTE — Progress Notes (Signed)
   08/16/21 1730  Clinical Encounter Type  Visited With Patient not available  Visit Type Initial;Trauma  Referral From Nurse  Consult/Referral To Chaplain   Chaplain responded to a level two trauma. Patient was under the care of the medical team.  No family present. If a chaplain is requested someone will respond.  Danice Goltz [facility]  [phone]

## 2021-08-16 NOTE — ED Notes (Signed)
Per MD C collar was taken off.

## 2021-08-16 NOTE — Discharge Instructions (Addendum)
You are seen in the emergency department for evaluation following a fall at home.  No intracranial or spinal fractures were identified but there was a left closed mildly displaced rib fracture.  You already on appropriate pain medications at home and no acute changes were made at this time in the emergency department.  We recommend close follow-up in the coming days with your primary care provider for appropriate adjustments of those pain medications.  We recommend appropriate rest at home.

## 2021-08-16 NOTE — ED Provider Notes (Signed)
[name] Provider Note   CSN: [id number] Arrival date & time: 08/16/21  1644     History  Chief Complaint  Patient presents with   [name] is a 71 y.o. female.   Fall   Patient is a 71 year old female with a history of COPD, HTN, A-fib on Coumadin, mitral valve replacement with bioprosthetic valve, DM 2, PAD, chronic opioid use who presents to the emergency department for evaluation following a fall.  Per EMS this evening the patient had a mechanical fall in which she fell onto her left side and landed on a metal fan breaking in half.  She is unsure if she hit her head at that time.  Patient report that since that time she has been having significant pain of her left side.  Her pain is made worse with palpation, movement and respiration.  She denies any double vision or blurred vision.  She denies any chest pain or shortness of breath.  She denies any neck or back pain.     Home Medications Prior to Admission medications   Medication Sig Start Date End Date Taking? Authorizing Provider  albuterol (PROAIR HFA) 108 (90 Base) MCG/ACT inhaler Inhale 2 puffs into the lungs every 6 (six) hours as needed for shortness of breath. 09/17/20   Sid Falcon, MD  ALPRAZolam (XANAX) 1 MG tablet TAKE 1 TABLET BY MOUTH EVERY NIGHT AT BEDTIME AS NEEDED FOR INSOMNIA. MAY TAKE 1/2 TABLET BY MOUTH DURING THE DAY FOR ANXIETY 07/25/21   Charise Killian, MD  benzonatate (TESSALON) 100 MG capsule Take 1 capsule (100 mg total) by mouth 3 (three) times daily as needed for cough. 08/02/20   Angelica Pou, MD  Blood Glucose Monitoring Suppl [facility] VERIO REFLECT) w/Device KIT Use to check blood sugar 1 time a day 05/29/21   [name], MD  buPROPion (WELLBUTRIN XL) 150 MG 24 hr tablet Take 1 tablet by mouth every morning 01/25/21 01/25/22  Angelica Pou, MD  cilostazol (PLETAL) 50 MG tablet Take 1 tablet (50 mg total) by mouth 2  (two) times daily. 07/04/21   Angelica Pou, MD  clobetasol ointment (TEMOVATE) 0.05 % Apply to affected area(s) 2 times daily as needed for (irritation). 07/26/21   Charise Killian, MD  Continuous Blood Gluc Sensor (FREESTYLE LIBRE 3 SENSOR) MISC Place 1 sensor on the skin every 14 days. Use to check glucose continuously 03/06/21   Angelica Pou, MD  DULoxetine (CYMBALTA) 60 MG capsule Take 1 capsule by mouth twice a day. 01/30/21     ferrous gluconate (FERGON) 324 MG tablet Take 1 tablet (324 mg total) by mouth daily with breakfast. Patient not taking: Reported on 08/05/2021 06/13/21   Angelica Pou, MD  fluticasone [facility]) 50 MCG/ACT nasal spray Place 2 sprays into both nostrils daily. 06/24/21   Angelica Pou, MD  Fluticasone-Umeclidin-Vilant 100-62.5-25 MCG/ACT AEPB Inhale 1 puff into the lungs daily. 01/28/21   Deneise Lever, MD  furosemide (LASIX) 80 MG tablet Take 1 tablet by mouth every morning AND 1/2 tablet daily in the afternoon. 09/28/20   Angelica Pou, MD  gabapentin (NEURONTIN) 300 MG capsule Take 2 capsules by mouth 3 times daily. 04/22/21   Angelica Pou, MD  HYDROcodone-acetaminophen (NORCO/VICODIN) 5-325 MG tablet Take 2 tablets by mouth 3 (three) times daily. 07/25/21   Charise Killian, MD  insulin degludec (TRESIBA FLEXTOUCH) 100 UNIT/ML FlexTouch Pen Inject 43 Units into the  skin daily. 08/01/21 01/19/22  Angelica Pou, MD  Insulin Pen Needle 32G X 4 MM MISC Use to inject insulin up to 6 times a day 08/04/17   [name], MD  ipratropium-albuterol (DUONEB) 0.5-2.5 (3) MG/3ML SOLN Take 3 mLs by nebulization every 6 (six) hours as needed (shortness of breath, wheezing). 11/12/20   Angelica Pou, MD  ketoconazole (NIZORAL) 2 % cream Apply 1 application topically at bedtime as needed for irritation. 07/17/21   Angelica Pou, MD  Lancets Misc. (ACCU-CHEK FASTCLIX LANCET) KIT Check your blood 4 times a day dx code 250.00 insulin  requiring 01/19/13   [name], MD  lidocaine (LIDODERM) 5 % Place 1 or 2 patches to painful area of back each day.  Remove & Discard patch within 12 hours or as directed by MD 02/18/21   Angelica Pou, MD  metoprolol succinate (TOPROL-XL) 50 MG 24 hr tablet Take 1 tablet by mouth daily. Take with or immediately following a meal. 01/25/21   Angelica Pou, MD  nystatin powder Apply topically 3  times daily. Patient taking differently: Apply 1 application  topically 3 (three) times daily as needed (irritation). 05/14/21   Angelica Pou, MD  omeprazole (PRILOSEC) 40 MG capsule Take 1 capsule by mouth in the morning and at bedtime. 01/28/21     ondansetron (ZOFRAN) 4 MG tablet Take 1 tablet (4 mg total) by mouth every 8 (eight) hours as needed for nausea or vomiting. 01/22/21 01/22/22  Angelica Pou, MD  OXYGEN Inhale 2 L into the lungs at bedtime.    [provider]  polyethylene glycol-electrolytes (NULYTELY) 420 g solution Take as directed 07/02/21     potassium chloride SA (KLOR-CON M) 20 MEQ tablet Take 2 tablets by mouth in the morning and 1 tablet in the afternoon. 07/17/21   Angelica Pou, MD  PRESCRIPTION MEDICATION Inhale into the lungs at bedtime. CPAP    [provider]  Probiotic Product (ALIGN) 4 MG CAPS Take 4 mg by mouth daily.    [provider]  rosuvastatin (CRESTOR) 20 MG tablet TAKE 1 TABLET BY MOUTH ONCE EVERY NIGHT AT BEDTIME 05/14/21   Angelica Pou, MD  Semaglutide,0.25 or 0.'5MG'$ /DOS, (OZEMPIC, 0.25 OR 0.5 MG/DOSE,) 2 MG/3ML SOPN Inject 0.5 mg into the skin once a week. Patient taking differently: Inject 0.5 mg into the skin every Monday. 06/18/21   Angelica Pou, MD  senna-docusate (SENOKOT S) 8.6-50 MG tablet Take 1 to 2 tablets by mouth once or twice a day as needed for constipation 07/04/21   Angelica Pou, MD  warfarin (COUMADIN) 2.5 MG tablet Take 1 tablet by mouth daily unless otherwise  instructed. Patient taking differently: Take 2.5 mg by mouth daily. Take 1 tablet by mouth daily unless otherwise instructed. 02/05/21   Angelica Pou, MD  Wheat Dextrin (BENEFIBER) POWD Take 2 Scoops by mouth daily.    [provider]      Allergies    Dexilant [dexlansoprazole], Lorazepam, Oxycontin [oxycodone], and Tramadol hcl    Review of Systems   Review of Systems  Physical Exam Updated Vital Signs BP (!) 114/47   Pulse 83   Temp (!) 97.5 F (36.4 C) (Oral)   Resp 19   Ht '5\' 2"'$  (1.575 m)   Wt 77.1 kg   SpO2 91%   BMI 31.09 kg/m  Physical Exam Vitals and nursing note reviewed.  Constitutional:      General: She is not  in acute distress.    Appearance: She is well-developed.  HENT:     Head: Atraumatic.     Right Ear: External ear normal.     Left Ear: External ear normal.     Nose: Nose normal.     Comments: No nasal septal hematoma    Mouth/Throat:     Comments: No blood in the oropharynx Eyes:     Extraocular Movements: Extraocular movements intact.     Conjunctiva/sclera: Conjunctivae normal.     Pupils: Pupils are equal, round, and reactive to light.  Neck:     Comments: Cervical collar in place Cardiovascular:     Rate and Rhythm: Normal rate and regular rhythm.     Heart sounds: No murmur heard. Pulmonary:     Effort: Pulmonary effort is normal. No respiratory distress.     Breath sounds: Wheezing (End expiratory wheezing) present.  Abdominal:     Palpations: Abdomen is soft.     Tenderness: There is abdominal tenderness (Mild tenderness to palpation on the left side of the abdomen). There is no guarding or rebound.  Musculoskeletal:        General: No swelling or deformity.     Cervical back: Neck supple.     Right lower leg: No edema.     Left lower leg: No edema.     Comments: Tenderness to palpation over the left side/flank  Skin:    General: Skin is warm and dry.     Capillary Refill: Capillary refill takes less than 2  seconds.  Neurological:     General: No focal deficit present.     Mental Status: She is alert and oriented to person, place, and time.  Psychiatric:        Mood and Affect: Mood normal.     ED Results / Procedures / Treatments   Labs (all labs ordered are listed, but only abnormal results are displayed) Labs Reviewed  COMPREHENSIVE METABOLIC PANEL - Abnormal; Notable for the following components:      Result Value   Potassium 5.2 (*)    Glucose, Bld 189 (*)    Creatinine, Ser 1.22 (*)    AST 13 (*)    GFR, Estimated 47 (*)    All other components within normal limits  CBC - Abnormal; Notable for the following components:   Hemoglobin 10.3 (*)    HCT 35.2 (*)    MCH 25.7 (*)    MCHC 29.3 (*)    RDW 17.3 (*)    All other components within normal limits  PROTIME-INR - Abnormal; Notable for the following components:   Prothrombin Time 22.9 (*)    INR 2.0 (*)    All other components within normal limits  I-STAT CHEM 8, ED - Abnormal; Notable for the following components:   Creatinine, Ser 1.20 (*)    Glucose, Bld 185 (*)    Calcium, Ion 1.12 (*)    Hemoglobin 11.2 (*)    HCT 33.0 (*)    All other components within normal limits  ETHANOL  LACTIC ACID, PLASMA  APTT  TYPE AND SCREEN    EKG None  Radiology CT CHEST ABDOMEN PELVIS W CONTRAST  Result Date: 08/16/2021 CLINICAL DATA:  [place], anticoagulated EXAM: CT CHEST, ABDOMEN, AND PELVIS WITH CONTRAST TECHNIQUE: Multidetector CT imaging of the chest, abdomen and pelvis was performed following the standard protocol during bolus administration of intravenous contrast. RADIATION DOSE REDUCTION: This exam was performed according to the departmental dose-optimization program which  includes automated exposure control, adjustment of the mA and/or kV according to patient size and/or use of iterative reconstruction technique. CONTRAST:  79m OMNIPAQUE IOHEXOL 300 MG/ML  SOLN COMPARISON:  06/11/2021, a levin 822 FINDINGS: CT CHEST  FINDINGS Cardiovascular: The heart is unremarkable without pericardial effusion. No evidence of thoracic aortic aneurysm or dissection. Diffuse atherosclerosis of the aorta and great vessels. No evidence of vascular injury. Mediastinum/Nodes: No enlarged mediastinal, hilar, or axillary lymph nodes. Thyroid gland, trachea, and esophagus demonstrate no significant findings. Lungs/Pleura: Upper lobe predominant emphysema. No acute airspace disease, effusion, or pneumothorax. Central airways are patent. Musculoskeletal: There is a minimally displaced left posterior eleventh rib fracture. No other acute bony abnormalities. Multiple prior healed right-sided rib fractures. Reconstructed images demonstrate no additional findings. CT ABDOMEN PELVIS FINDINGS Hepatobiliary: Small gallstones layer dependently within the gallbladder. No evidence of acute cholecystitis. The liver is unremarkable, with no evidence of biliary duct dilation. Pancreas: Unremarkable. No pancreatic ductal dilatation or surrounding inflammatory changes. Spleen: Normal in size without focal abnormality. Adrenals/Urinary Tract: 3.1 cm peripherally calcified lesion exophytic off the left kidney consistent with prior tumor ablation. 1.2 cm simple cyst right kidney does not require imaging follow-up. Otherwise the kidneys enhance normally. Stable nonobstructing 6 mm calculus lower pole right kidney. No obstructive uropathy within either kidney. The adrenals are stable.  Bladder is unremarkable. Stomach/Bowel: No bowel obstruction or ileus. Normal appendix right lower quadrant. No bowel wall thickening or inflammatory change. Vascular/Lymphatic: Aortic atherosclerosis. No enlarged abdominal or pelvic lymph nodes. Reproductive: Uterus and bilateral adnexa are unremarkable. Other: No free fluid or free intraperitoneal gas. No abdominal wall hernia. Musculoskeletal: No acute or destructive bony lesions. Reconstructed images demonstrate no additional findings.  IMPRESSION: 1. Minimally displaced posterior left eleventh rib fracture. 2. No acute intra-abdominal or intrapelvic trauma. 3. Cholelithiasis without cholecystitis. 4. Nonobstructing bilateral renal calculi. 5. Post therapeutic changes from left renal cell carcinoma ablation. 6. Aortic Atherosclerosis (ICD10-I70.0) and Emphysema (ICD10-J43.9). Electronically Signed   By: MRanda NgoM.D.   On: 08/16/2021 18:17   CT CERVICAL SPINE WO CONTRAST  Result Date: 08/16/2021 CLINICAL DATA:  Anticoagulated, fell EXAM: CT CERVICAL SPINE WITHOUT CONTRAST TECHNIQUE: Multidetector CT imaging of the cervical spine was performed without intravenous contrast. Multiplanar CT image reconstructions were also generated. RADIATION DOSE REDUCTION: This exam was performed according to the departmental dose-optimization program which includes automated exposure control, adjustment of the mA and/or kV according to patient size and/or use of iterative reconstruction technique. COMPARISON:  10/01/2017 FINDINGS: Alignment: Alignment is anatomic. Skull base and vertebrae: No acute fracture. No primary bone lesion or focal pathologic process. Soft tissues and spinal canal: No prevertebral fluid or swelling. No visible canal hematoma. Disc levels: Mild mid cervical spondylosis. Disc spaces are relatively well preserved. Upper chest: Airway is patent. Emphysematous changes are seen at the lung apices. Other: Reconstructed images demonstrate no additional findings. IMPRESSION: 1. No acute cervical spine fracture. Electronically Signed   By: MRanda NgoM.D.   On: 08/16/2021 18:10   CT HEAD WO CONTRAST  Result Date: 08/16/2021 CLINICAL DATA:  Anticoagulated, fell EXAM: CT HEAD WITHOUT CONTRAST TECHNIQUE: Contiguous axial images were obtained from the base of the skull through the vertex without intravenous contrast. RADIATION DOSE REDUCTION: This exam was performed according to the departmental dose-optimization program which includes  automated exposure control, adjustment of the mA and/or kV according to patient size and/or use of iterative reconstruction technique. COMPARISON:  12/15/2020 FINDINGS: Brain: No acute infarct or hemorrhage. Lateral ventricles  and midline structures are unremarkable. No acute extra-axial fluid collections. No mass effect. Vascular: No hyperdense vessel or unexpected calcification. Skull: Normal. Negative for fracture or focal lesion. Sinuses/Orbits: Polypoid mucosal thickening left maxillary sinus. Remaining sinuses are clear. Other: None. IMPRESSION: 1. No acute intracranial process. Electronically Signed   By: Randa Ngo M.D.   On: 08/16/2021 18:08   DG Pelvis Portable  Result Date: 08/16/2021 CLINICAL DATA:  [place], hip pain EXAM: PORTABLE PELVIS 1-2 VIEWS COMPARISON:  01/15/2021 FINDINGS: Frontal view of the pelvis includes both hips. Portions of the right hemipelvis are obscured by overlying cardiac leads. There are no acute displaced fractures. Symmetrical bilateral hip osteoarthritis. Soft tissues appear unremarkable. IMPRESSION: 1. No acute bony abnormality. Electronically Signed   By: Randa Ngo M.D.   On: 08/16/2021 17:12   DG Chest Port 1 View  Result Date: 08/16/2021 CLINICAL DATA:  [place], anticoagulated EXAM: PORTABLE CHEST 1 VIEW COMPARISON:  05/03/2020 FINDINGS: Single frontal view of the chest demonstrates stable postsurgical changes from median sternotomy. Cardiac silhouette is unremarkable. No acute airspace disease, effusion, or pneumothorax. There are no acute displaced fractures. Stable postsurgical changes right clavicle. IMPRESSION: 1. No acute intrathoracic process. Electronically Signed   By: Randa Ngo M.D.   On: 08/16/2021 17:10    Procedures Procedures    Medications Ordered in ED Medications  morphine (PF) 2 MG/ML injection 2 mg (2 mg Intravenous Given 08/16/21 1726)  iohexol (OMNIPAQUE) 300 MG/ML solution 85 mL (85 mLs Intravenous Contrast Given 08/16/21 1745)   morphine (PF) 2 MG/ML injection 2 mg (2 mg Intravenous Given 08/16/21 1943)  HYDROcodone-acetaminophen (NORCO/VICODIN) 5-325 MG per tablet 2 tablet (2 tablets Oral Given 08/16/21 2058)    ED Course/ Medical Decision Making/ A&P                           Medical Decision Making Problems Addressed: Closed fracture of one rib of left side, initial encounter: complicated acute illness or injury with systemic symptoms Fall, initial encounter: complicated acute illness or injury with systemic symptoms that poses a threat to life or bodily functions  Amount and/or Complexity of Data Reviewed Independent Historian: EMS External Data Reviewed: radiology and notes. Labs: ordered. Radiology: ordered.  Risk Prescription drug management.   Patient is a 71 year old female presents emergency department as above.  On initial presentation patient's vital signs are stable and she is afebrile.  Initial physical exam did reveal some tenderness palpation of the left side of the patient.  No obvious scalp hematomas identified, however in the circumstance the patient did have a fall with questionable loss of consciousness and questionable hitting of her head while on warfarin a level 2 was ultimately called for this patient.  Bedside chest x-ray and pelvic x-ray did not reveal any signs of acute fractures.  CT head and C-spine were unremarkable.  Baseline laboratory work showed a slight hyperkalemia at 5.2.  Slight elevation creatinine 1.22, not significantly changed from her baseline.  INR slightly elevated at 2.  No evidence of leukocytosis.  Mild normocytic anemia of 10.3.  CT chest abdomen pelvis ultimately did reveal a minimally displaced posterior left 11th rib fracture.  She had post therapeutic changes from left renal cell carcinoma ablation.  These findings were communicated with the patient.  She was provided additional pain medications at bedside.  Given she was on an extensive pain medication resume at  home provided by her primary care provider, no additional pain  medication prescriptions were given for this patient and she was instructed to call her primary care provider in the coming 1 to 2 days for follow-up appointment so that she may have her medication regime adjusted.  Plan and findings discussed with the patient at bedside she verbalized understanding was agreeable.  Patient discharged from the ED in stable condition.        Final Clinical Impression(s) / ED Diagnoses Final diagnoses:  Fall, initial encounter  Closed fracture of one rib of left side, initial encounter    Rx / DC Orders ED Discharge Orders     None         Nelta Numbers, MD 08/17/21 [id number]    Malvin Johns, MD 08/20/21 [phone]

## 2021-08-16 NOTE — TOC CAGE-AID Note (Signed)
Transition of Care [facility]) - CAGE-AID Screening   Patient Details  Name: [name] MRN: [id number] Date of Birth: 07/01/1950  Clinical Narrative:  Patient denies any alcohol or drug use. Endorses smoking cigarettes. No need for substance abuse resources at this time.  CAGE-AID Screening:    Have You Ever Felt You Ought to Cut Down on Your Drinking or Drug Use?: No Have People Annoyed You By Critizing Your Drinking Or Drug Use?: No Have You Felt Bad Or Guilty About Your Drinking Or Drug Use?: No Have You Ever Had a Drink or Used Drugs First Thing In The Morning to Steady Your Nerves or to Get Rid of a Hangover?: No CAGE-AID Score: 0  Substance Abuse Education Offered: No

## 2021-08-16 NOTE — ED Notes (Signed)
Trauma Response Nurse Documentation   [name] is a 71 y.o. female arriving to [facility] ED via EMS  On warfarin daily. Trauma was activated as a Level 2 based on the following trauma criteria Elderly patients > 65 with head trauma on anti-coagulation (excluding ASA). Trauma team at the bedside on patient arrival. Patient cleared for CT by Dr. Tamera Punt. Patient to CT with team. GCS 15.  Per patient, unsure if she hit her head. Mechanical fall, she tripped on her feet and fell on her left side. Pain to the area, 10/10. C-collar placed by EMS.  History   Past Medical History:  Diagnosis Date   Abnormality of lung on CXR 02/02/2020   Nonspecific finding on CXR ordered by pulmonologist - c/w inflammation vs infection, f/u imaging suggested, Dr. [name] office has been in communication about recommended next steps.   Anxiety 07/24/2010   Aortic atherosclerosis ([place]) 10/19/2014   Seen on CT scan, currently asymptomatic   Arteriovenous malformation of gastrointestinal tract 08/08/2015   Non-bleeding when visualized on capsule endoscopy 06/30/2015    Arthritis    "lower back; hands" (02/19/2018)   Asthma    Asymptomatic cholelithiasis 09/25/2015   Seen on CT scan 08/2015   Carotid artery stenosis; s/p R endarterectomy    s/p right endarterectomy (06/2010) Carotid US (07/2010):  Left: Moderate-to-severe (60-79%) calcific and non-calcific plaque origin and proximal ICA and ECA    Chronic congestive heart failure with left ventricular diastolic dysfunction (HCC) 10/21/2010   Chronic constipation 02/03/2011   Chronic daily headache 01/16/2014   Chronic iron deficiency anemia    Chronic low back pain 10/06/2012   Chronic venous insufficiency 08/04/2012   COPD (chronic obstructive pulmonary disease) with emphysema (HCC)    PFTs 2018: severe obstructive disease, insignif response to bronchiodilator, mild restriction parenchymal pattern, moderately severe diffusion defect. 2014  FEV1 0.92  (40%), ratio 69, 27% increase in FEV1 with BD, TLC 91%, severe airtrapping, DLCO49% On chronic home O2. Pulmonary rehab referral 05/2012    Dyspnea    Fibromyalgia 08/29/2010   Gastroesophageal reflux disease    History of blood transfusion    "several times"  (02/19/2018)   History of clear cell renal cell carcinoma ([place]), in remission 07/21/2011   s/p cryoablation of left RCC in 09/2011 by [name]. Followed by [name]  [facility]) .     History of hiatal hernia    History of mitral valve replacement with bioprosthetic valve due to mitral stenosis 2012   s/p MVR with a 27-mm pericardial porcine valve (Medtronic Mosaic valve, serial #[id number] on 09/20/10, Dr. Prescott Gum)    History of obstructive sleep apnea, resolved 2013   resolved per sleep study 07/2019; no apnea, but did have desaturation.  CPAP no longer necessary.  Nocturnal polysomnography (06/2009): Moderate sleep apnea/ hypopnea syndrome , AHI 17.8 per hour with nonpositional hypopneas. CPAP titration to 12 CWP, AHI 2.4 per hour. On nocturnal CPAP via a small resMed Quattro full-face mask with heated humidifier.    History of pneumonia    "once"  (02/19/2018)   History of seborrheic keratosis 09/28/2015   Hyperlipidemia LDL goal < 100 11/20/2005   Internal and external hemorrhoids without complication 40/34/7425   Lesion of left native kidney 06/01/2020   Incidental finding on recent screening chest CT for lung ca ordered by Dr. Annamaria Boots pulmonology - he has ordered a f/u renal U/S.    Lichen sclerosus of female genitalia 01/12/2017   Migraine    "  none in years" (02/19/2018)   Moderately severe major depression ([place]) 11/19/2005   Nocturnal hypoxia per sleep study 07/2019    Osteoporosis    DEXA 2016: T -2.7; DEXA (12/09/2011): L-spine T -3.7, left hip T -1.4 DEXA (12/2004): L-spine T -2.6, left hip -0.1    Paroxysmal atrial fibrillation ([place]) 10/22/2010   s/p Left atrial maze procedure for paroxysmal atrial fibrillation  on 09/20/2010 by Dr Prescott Gum.  Subsequent splenic infarct, decision was made to re-anticoagulate with coumadin, likely life-long as this is the most likely cause of the splenic infarct.    Personal history of colonic polyps 05/14/2011   Colonoscopy (05/2011): 4 mm adenomatous polyp excised endoscopically Colonoscopy (02/2002): Adenomatous polyp excised endoscopically    Personal history of renal cell carcinoma 09/12/2020   Pneumonia    Pulmonary hypertension due to chronic obstructive pulmonary disease ([place]) 04/25/2016   2014 TEE w PA peak pressure 46 mmHg, s/p MV replacement    Right nephrolithiasis, asymptomatic, incidental finding 09/06/2014   5 mm non-obstructing calculus seen on CT scan 09/05/2014    Right ventricular failure ([place]) 04/25/2016   Severe obesity (BMI 35.0-39.9) with comorbidity ([place]) 10/23/2011   Sleep apnea    Tobacco abuse 07/28/2012   Type 2 diabetes mellitus with diabetic neuropathy [facility])      Past Surgical History:  Procedure Laterality Date   BIOPSY  07/19/2021   Procedure: BIOPSY;  Surgeon: Clarene Essex, MD;  Location: [facility] ENDOSCOPY;  Service: Gastroenterology;;   CARDIAC CATHETERIZATION     CARDIAC VALVE REPLACEMENT  Aug. 2012   "mitral valve"   CAROTID ENDARTERECTOMY Right 07/04/2010   by [name] for asymptomatic right carotid artery stenosis   CATARACT EXTRACTION W/ INTRAOCULAR LENS  IMPLANT, BILATERAL Bilateral    CHEST TUBE INSERTION  09/24/2010   Dr Lucianne Lei Trigt   COLONOSCOPY  05/12/2011   performed by Dr. Michail Sermon. Showing small internal hemorrhoids, single tubular adenoma polyp   COLONOSCOPY WITH PROPOFOL N/A 05/29/2020   Procedure: COLONOSCOPY WITH PROPOFOL;  Surgeon: Clarene Essex, MD;  Location: [facility];  Service: Endoscopy;  Laterality: N/A;   COLONOSCOPY WITH PROPOFOL N/A 07/19/2021   Procedure: COLONOSCOPY WITH PROPOFOL;  Surgeon: Clarene Essex, MD;  Location: [facility];  Service: Gastroenterology;  Laterality: N/A;   CRYOABLATION Left 09/2011   by Dr.  Kathlene Cote. Followed by [name]  [facility]) .     DILATION AND CURETTAGE OF UTERUS     ESOPHAGOGASTRODUODENOSCOPY  05/12/2011   performed by Dr. Michail Sermon. Negative for ulcerations, biopsy negative for evidence of celiac sprue   ESOPHAGOGASTRODUODENOSCOPY N/A 06/29/2015   Procedure: ESOPHAGOGASTRODUODENOSCOPY (EGD);  Surgeon: Clarene Essex, MD;  Location: [facility] ENDOSCOPY;  Service: Endoscopy;  Laterality: N/A;   ESOPHAGOGASTRODUODENOSCOPY N/A 03/29/2016   Procedure: ESOPHAGOGASTRODUODENOSCOPY (EGD);  Surgeon: Clarene Essex, MD;  Location: [facility] ENDOSCOPY;  Service: Endoscopy;  Laterality: N/A;   ESOPHAGOGASTRODUODENOSCOPY (EGD) WITH PROPOFOL N/A 05/29/2020   Procedure: ESOPHAGOGASTRODUODENOSCOPY (EGD) WITH PROPOFOL;  Surgeon: Clarene Essex, MD;  [facility];  Service: Endoscopy;  Laterality: N/A;   FRACTURE SURGERY     GIVENS CAPSULE STUDY N/A 06/30/2015   Procedure: GIVENS CAPSULE STUDY;  Surgeon: Clarene Essex, MD;  Location: [place];  Service: Endoscopy;  Laterality: N/A;   GIVENS CAPSULE STUDY N/A 06/29/2015   Procedure: GIVENS CAPSULE STUDY;  Surgeon: Clarene Essex, MD;  Location: [place];  Service: Endoscopy;  Laterality: N/A;   HEMORRHOID SURGERY  1970s?   "lanced"   HYSTEROSCOPY W/ ENDOMETRIAL ABLATION  06/2001   for  persistent post-menopausal bleeding // by S. [name], M.D.   IR GENERIC HISTORICAL  08/23/2015   IR RADIOLOGIST EVAL & MGMT 08/23/2015 Aletta Edouard, MD [facility] INTERV RAD   IR GENERIC HISTORICAL  04/09/2016   IR RADIOLOGIST EVAL & MGMT 04/09/2016 Aletta Edouard, MD [facility] INTERV RAD   IR RADIOLOGIST EVAL & MGMT  10/07/2016   IR RADIOLOGIST EVAL & MGMT  06/25/2017   IR RADIOLOGIST EVAL & MGMT  07/31/2020   IR RADIOLOGIST EVAL & MGMT  10/02/2020   IR RADIOLOGIST EVAL & MGMT  12/20/2020   LEFT HEART CATH AND CORONARY ANGIOGRAPHY N/A 04/21/2016   Procedure: Left Heart Cath and Coronary Angiography;  Surgeon: Lorretta Harp, MD;  Location: [place] [facility];   Service: Cardiovascular;  Laterality: N/A;   LIPOMA EXCISION  08/2005   occipital lipoma 1.5cm - by Dr. [name]   LITHOTRIPSY  ~ 2000   MAZE Left 09/20/10   for paroxysmal atrial fibrillation (Dr. Prescott Gum)   MITRAL VALVE REPLACEMENT  09/20/10    with a 27-mm pericardial porcine valve (Medtronic Mosaic valve, serial #[id number]). 09/20/10, Dr Prescott Gum   ORIF CLAVICLE FRACTURE Right 01/2004   by Thana Farr. [name], M.D for Right clavicle nonunion.; "it's got a pin in it"   POLYPECTOMY  05/29/2020   Procedure: POLYPECTOMY;  Surgeon: Clarene Essex, MD;  [facility];  Service: Endoscopy;;   POLYPECTOMY  07/19/2021   Procedure: POLYPECTOMY;  Surgeon: Clarene Essex, MD;  [facility];  Service: Gastroenterology;;   RADIOLOGY WITH ANESTHESIA Left 09/12/2020   Procedure: RADIOLOGY WITH ANESTHESIA- RENAL CRYOABLATION;  Surgeon: Aletta Edouard, MD;  [facility];  Service: Radiology;  Laterality: Left;   REFRACTIVE SURGERY Bilateral    RIGHT HEART CATH N/A 04/23/2016   Procedure: Right Heart Cath;  Surgeon: [name], MD;  Location: [place] CV LAB;  Service: Cardiovascular;  Laterality: N/A;   TONSILLECTOMY     TUBAL LIGATION       Initial Focused Assessment (If applicable, or please see trauma documentation): A&Ox4, GCS 15,, PERR 3 No obvious trauma, patient complaining of pain to left ribs  CT's Completed:   CT Head, CT C-Spine, CT Chest w/ contrast, and CT abdomen/pelvis w/ contrast   Interventions:  IV, labs CXR/PXR CT Head/Cspine/C/A/P 21m Morphine  Event Summary: Results showing a left rib fx, no other injuries. C-collar removed by EDRN per EDP. Pending re-evalution.  Bedside handoff with ED RN CLysbeth Galas    APark PopeTroxler  Trauma Response RN  Please call TRN at [phone] further assistance.

## 2021-08-19 ENCOUNTER — Other Ambulatory Visit (HOSPITAL_COMMUNITY): Payer: Self-pay

## 2021-08-19 ENCOUNTER — Telehealth: Payer: Self-pay | Admitting: Pharmacist

## 2021-08-19 ENCOUNTER — Ambulatory Visit: Payer: HMO | Admitting: Podiatry

## 2021-08-19 MED ORDER — HYDROCODONE-ACETAMINOPHEN 5-325 MG PO TABS
2.0000 | ORAL_TABLET | Freq: Three times a day (TID) | ORAL | 0 refills | Status: DC
  Filled 2021-08-19 – 2021-08-20 (×2): qty 180, 30d supply, fill #0

## 2021-08-19 MED ORDER — ALPRAZOLAM 1 MG PO TABS
ORAL_TABLET | ORAL | 0 refills | Status: DC
  Filled 2021-08-19 – 2021-08-20 (×2): qty 45, 30d supply, fill #0

## 2021-08-19 NOTE — Telephone Encounter (Signed)
INTERNAL MEDICINE TEACHING ATTENDING ADDENDUM   I agree with pharmacy recommendations as outlined in their note.   Dorian Pod, MD

## 2021-08-19 NOTE — Telephone Encounter (Signed)
Patient reports PST FS POC INR results today of 2.4 (target 1.5 - 2.5). Will CONTINUE SAME REGIMEN: 1x2.50m on Su/Tu/We/Th/Sa; 1/2 x 2.538m(1.2546mose) on Friday. Repeat INR 17-JUL-23. No bleeding reported.

## 2021-08-20 ENCOUNTER — Other Ambulatory Visit (HOSPITAL_COMMUNITY): Payer: Self-pay

## 2021-08-22 ENCOUNTER — Ambulatory Visit (INDEPENDENT_AMBULATORY_CARE_PROVIDER_SITE_OTHER): Payer: HMO | Admitting: Internal Medicine

## 2021-08-22 VITALS — BP 137/58 | HR 68 | Temp 97.6°F | Ht 62.0 in | Wt 165.0 lb

## 2021-08-22 DIAGNOSIS — I739 Peripheral vascular disease, unspecified: Secondary | ICD-10-CM

## 2021-08-22 DIAGNOSIS — S2232XD Fracture of one rib, left side, subsequent encounter for fracture with routine healing: Secondary | ICD-10-CM | POA: Diagnosis not present

## 2021-08-22 MED ORDER — KETOROLAC TROMETHAMINE 60 MG/2ML IM SOLN
60.0000 mg | Freq: Once | INTRAMUSCULAR | Status: AC
  Administered 2021-08-22: 60 mg via INTRAMUSCULAR

## 2021-08-23 ENCOUNTER — Ambulatory Visit (HOSPITAL_COMMUNITY)
Admission: RE | Admit: 2021-08-23 | Discharge: 2021-08-23 | Disposition: A | Discharge status: Home / Self Care | Payer: HMO | Source: Ambulatory Visit | Attending: Cardiology | Admitting: Cardiology

## 2021-08-23 ENCOUNTER — Encounter (HOSPITAL_COMMUNITY): Payer: Self-pay | Admitting: Cardiology

## 2021-08-23 VITALS — BP 124/60 | HR 63 | Wt 167.2 lb

## 2021-08-23 DIAGNOSIS — Z85528 Personal history of other malignant neoplasm of kidney: Secondary | ICD-10-CM | POA: Insufficient documentation

## 2021-08-23 DIAGNOSIS — F17200 Nicotine dependence, unspecified, uncomplicated: Secondary | ICD-10-CM | POA: Insufficient documentation

## 2021-08-23 DIAGNOSIS — I5032 Chronic diastolic (congestive) heart failure: Secondary | ICD-10-CM | POA: Diagnosis not present

## 2021-08-23 DIAGNOSIS — S2232XA Fracture of one rib, left side, initial encounter for closed fracture: Secondary | ICD-10-CM | POA: Insufficient documentation

## 2021-08-23 DIAGNOSIS — Z794 Long term (current) use of insulin: Secondary | ICD-10-CM | POA: Diagnosis not present

## 2021-08-23 DIAGNOSIS — I2510 Atherosclerotic heart disease of native coronary artery without angina pectoris: Secondary | ICD-10-CM | POA: Insufficient documentation

## 2021-08-23 DIAGNOSIS — G4733 Obstructive sleep apnea (adult) (pediatric): Secondary | ICD-10-CM | POA: Insufficient documentation

## 2021-08-23 DIAGNOSIS — Z9981 Dependence on supplemental oxygen: Secondary | ICD-10-CM | POA: Insufficient documentation

## 2021-08-23 DIAGNOSIS — Z7901 Long term (current) use of anticoagulants: Secondary | ICD-10-CM | POA: Diagnosis not present

## 2021-08-23 DIAGNOSIS — I272 Pulmonary hypertension, unspecified: Secondary | ICD-10-CM | POA: Insufficient documentation

## 2021-08-23 DIAGNOSIS — J449 Chronic obstructive pulmonary disease, unspecified: Secondary | ICD-10-CM | POA: Diagnosis not present

## 2021-08-23 DIAGNOSIS — I110 Hypertensive heart disease with heart failure: Secondary | ICD-10-CM | POA: Diagnosis not present

## 2021-08-23 DIAGNOSIS — I480 Paroxysmal atrial fibrillation: Secondary | ICD-10-CM | POA: Insufficient documentation

## 2021-08-23 DIAGNOSIS — E785 Hyperlipidemia, unspecified: Secondary | ICD-10-CM | POA: Diagnosis not present

## 2021-08-23 DIAGNOSIS — E1140 Type 2 diabetes mellitus with diabetic neuropathy, unspecified: Secondary | ICD-10-CM | POA: Diagnosis not present

## 2021-08-23 DIAGNOSIS — Z8249 Family history of ischemic heart disease and other diseases of the circulatory system: Secondary | ICD-10-CM | POA: Insufficient documentation

## 2021-08-23 DIAGNOSIS — Z9989 Dependence on other enabling machines and devices: Secondary | ICD-10-CM | POA: Insufficient documentation

## 2021-08-23 DIAGNOSIS — I739 Peripheral vascular disease, unspecified: Secondary | ICD-10-CM | POA: Diagnosis not present

## 2021-08-23 DIAGNOSIS — E1151 Type 2 diabetes mellitus with diabetic peripheral angiopathy without gangrene: Secondary | ICD-10-CM | POA: Diagnosis not present

## 2021-08-23 DIAGNOSIS — Z79899 Other long term (current) drug therapy: Secondary | ICD-10-CM | POA: Insufficient documentation

## 2021-08-23 DIAGNOSIS — Z953 Presence of xenogenic heart valve: Secondary | ICD-10-CM | POA: Diagnosis not present

## 2021-08-23 HISTORY — DX: Fracture of one rib, left side, initial encounter for closed fracture: S22.32XA

## 2021-08-23 LAB — LIPID PANEL
Cholesterol: 96 mg/dL (ref 0–200)
HDL: 36 mg/dL — ABNORMAL LOW (ref >40)
LDL Cholesterol: 35 mg/dL (ref 0–99)
Total CHOL/HDL Ratio: 2.7 RATIO
Triglycerides: 125 mg/dL (ref <150)
VLDL: 25 mg/dL (ref 0–40)

## 2021-08-23 LAB — BASIC METABOLIC PANEL
Anion gap: 8 (ref 5–15)
BUN: 18 mg/dL (ref 8–23)
CO2: 27 mmol/L (ref 22–32)
Calcium: 8.9 mg/dL (ref 8.9–10.3)
Chloride: 101 mmol/L (ref 98–111)
Creatinine, Ser: 1.22 mg/dL — ABNORMAL HIGH (ref 0.44–1.00)
GFR, Estimated: 47 mL/min — ABNORMAL LOW (ref >60)
Glucose, Bld: 172 mg/dL — ABNORMAL HIGH (ref 70–99)
Potassium: 5.1 mmol/L (ref 3.5–5.1)
Sodium: 136 mmol/L (ref 135–145)

## 2021-08-23 MED ORDER — POTASSIUM CHLORIDE CRYS ER 20 MEQ PO TBCR: 40.0000 meq | EXTENDED_RELEASE_TABLET | Freq: Every day | ORAL | 3 refills | Status: DC

## 2021-08-23 NOTE — Patient Instructions (Signed)
Medication Changes:  Decrease Potassium to 40 meq (2 tabs) Daily  MAKE SURE TO TAKE YOUR AFTERNOON DOSE OF FUROSEMIDE  Lab Work:  Labs done today, your results will be available in MyChart, we will contact you for abnormal readings.  Testing/Procedures:  none  Referrals:  none  Special Instructions // Education:  Do the following things EVERYDAY: Weigh yourself in the morning before breakfast. Write it down and keep it in a log. Take your medicines as prescribed Eat low salt foods--Limit salt (sodium) to 2000 mg per day.  Stay as active as you can everyday Limit all fluids for the day to less than 2 liters   Follow-Up in: 3 months  At the [facility], you and your health needs are our priority. We have a designated team specialized in the treatment of Heart Failure. This Care Team includes your primary Heart Failure Specialized Cardiologist (physician), Advanced Practice Providers (APPs- Physician Assistants and Nurse Practitioners), and Pharmacist who all work together to provide you with the care you need, when you need it.   You may see any of the following providers on your designated Care Team at your next follow up:  Dr Glori Bickers Dr Haynes Kerns, NP Lyda Jester, [place] [facility] [place], [place] Audry Riles, PharmD   Please be sure to bring in all your medications bottles to every appointment.   Need to Contact us:  If you have any questions or concerns before your next appointment please send [place] a message through [place] or call our office at [phone].    TO LEAVE A MESSAGE FOR THE NURSE SELECT OPTION 2, PLEASE LEAVE A MESSAGE INCLUDING: YOUR NAME DATE OF BIRTH CALL BACK NUMBER REASON FOR CALL**this is important as we prioritize the call backs  YOU WILL RECEIVE A CALL BACK THE SAME DAY AS LONG AS YOU CALL BEFORE 4:00 PM

## 2021-08-23 NOTE — Progress Notes (Signed)
For follow-up of ED visit on 08/16/2021 where she was found to have closed left rib fracture sustained during a mechanical fall onto her left side landing on a metal fan.  She has been very sore since then, finding it difficult to get up and around, but pushing through the pain.  She has not changed her chronic management during this time.  We reviewed her recent vascular surgery visit at which [name] concluded that her lower extremity pain was not consistent with ischemic pain, but rather was multifactorial with contributions of musculoskeletal and diabetic neuropathy etiologies.  She was disappointed, as we had hoped she may be amenable to some intervention which might improve her symptoms.  She has not found relief with the Pletal and this will be discontinued.  Physical exam BP (!) 137/58 (BP Location: Right Arm, Patient Position: Sitting, Cuff Size: Small)   Pulse 68   Temp 97.6 F (36.4 C) (Oral)   Ht _0  (1.575 m)   Wt 165 lb (74.8 kg)   SpO2 95%   BMI 30.18 kg/m  Evaluated sitting in guest chair.  She rises by pushing off and supporting herself by the exam table.  Left side and flank with significant ecchymoses, no break in the skin.  She is able to fully inflate lungs without splinting.  Lower extremities without edema.  Feet are warm and cyanotic, chronically pulseless.  Long nails were reduced with nippers today.  Assessment and plan Peripheral arterial disease (HCC) Cilostazol ineffective, to be discontinued.  Vascular surgery consultation with [name] concluded that her LE pain is not likely due to PAD but rather multifactorial related to chronic musculoskeletal and neuropathy conditions.  Closed fracture of one rib of left side Mechanical fall onto L side 08/16/21.  CT chest " There is a minimally displaced left posterior eleventh rib fracture. No other acute bony abnormalities. Multiple prior healed right-sided rib fractures.  Ecchymosis exacerbated due to therapeutic  warfarin (INR 2.0).  ADvised application of lidoderm patch.  Toradol administered today.  Renal function reviewed and risk accepted by patient.  Discuss osteoporosis at next visit.

## 2021-08-23 NOTE — Assessment & Plan Note (Signed)
Cilostazol ineffective, to be discontinued.  Vascular surgery consultation with [name] concluded that her LE pain is not likely due to PAD but rather multifactorial related to chronic musculoskeletal and neuropathy conditions.

## 2021-08-23 NOTE — Assessment & Plan Note (Signed)
Mechanical fall onto L side 08/16/21.  CT chest " There is a minimally displaced left posterior eleventh rib fracture. No other acute bony abnormalities. Multiple prior healed right-sided rib fractures.  Ecchymosis exacerbated due to therapeutic warfarin (INR 2.0).  ADvised application of lidoderm patch.  Toradol administered today.  Renal function reviewed and risk accepted by patient.

## 2021-08-24 NOTE — Progress Notes (Addendum)
PCP: Angelica Pou, MD HF Cardiology: Dr. Aundra Dubin  71 y.o. with COPD, mitral stenosis s/p bioprosthetic MV replacement, paroxysmal atrial fibrillation, GI bleeding, and pulmonary hypertension/RV failure presents for cardiology followup.  She was admitted with GI bleeding, thought to be AVMs, in 2/18 and warfarin was stopped.  She was admitted in 3/18 with chest pain, dyspnea, and elevated troponin.  She had not been taking her Lasix regularly.  Coronary angiography showed nonobstructive CAD, suspect demand ischemia. RHC showed elevated right and left heart filling pressures and severe pulmonary hypertension.  V/Q scan showed no chronic PE, and PFTs showed severe obstructive lung disease.  Echo showed severe RV dilation with mildly decreased systolic function.  She was diuresed and eventually discharged to [facility].   She was admitted in 2/19 with anemia, got 2 units PRBCs.    Echo in 5/19 with EF 60-65%, mildly decreased RV systolic function, PASP 49 mmHg, normal bioprosthetic mitral valve. PYP scan (4/21) grade 2, H/CL 1.3 (equivocal).  PYP scan repeated 10/21, grade 1 with H/CL 1.04. Echo in 12/21 with EF 60-65%, mild LVH, normal RV, PASP 35 mmHg, s/p bioprosthetic mitral valve with mean gradient 4, IVC normal.   She returns for followup of diastolic CHF.  Still smoking 1 ppd and not interested in quitting.  She has a severely abnormal right ABI with bilateral leg pain.  She has seen [name], symptoms thought to be most likely neuropathic or joint-related rather than due to her PAD.  She likely does not walk enough to elicit claudication.  No rest pain or pedal ulcerations.  She takes her morning Lasix but often skips her afternoon Lasix.  Weight is fairly stable. She sleeps on 1 pillow.  She is short of breath walking short distances.  She uses oxygen during the day and CPAP at night.    Labs (3/18): K 4.8 => 3.9, creatinine 1.2 => 1.09, hgb 10.2 => 10.7, BNP 131 Labs (5/18): K 4,  creatinine 1.0 Labs (2/19): K 4.5, creatinine 0.94, transferrin saturation 3%.  Labs (3/19): hgb 11.3 Labs (4/19): K 4.1, creatinine 1.0, LDL 36 Labs (1/10): creatinine 0.85 Labs (7/20): hgb 10.7  Labs (10/20): K 4, creatinine 1.13, LDL 48 Labs (11/20): hgb 9.2 Labs (1/21): K 4.2, creatinine 1.28 Labs (2/21): hgb 10.7 Labs (8/21): LDL 46, HDL 35, K 4.2, creatinine 1.11 Labs (3/22): LDL 51, TGs 171, K 4.3, creatinine 0.99 Labs (7/23): K 5, creatinine 1.22, hgb 10.3  PMH: 1. Mitral stenosis: s/p bioprosthetic mitral valve replacement.  2. COPD: Severe by 3/18 PFTs.  She is still smoking.  3. Atrial fibrillation: Paroxysmal. Has been off warfarin since GI bleed in 2/18.  4. OSA: Uses CPAP.  5. Type II diabetes. 6. Pulmonary hypertension/RV failure: Suspect primarily group 3 PH due to underlying lung disease (COPD).  - RHC (3/18) with mean RA 14, PA 77/28 mean 47, mean PCWP 23, CI 2.6, PVR 4.9.  - Echo (3/18): EF 60-65%, severe RV dilation with mildly decreased RV systolic function, PASP 76 mmHg.  Bioprosthetic MV looks ok.  - V/Q scan (3/18): No evidence for chronic PE.  - PFTs (3/18): Severe obstructive airways disease.  - Echo in 5/19 with EF 60-65%, mildly decreased RV systolic function, PASP 49 mmHg, normal bioprosthetic mitral valve.  - PYP scan (10/21) with grade 1, H/CL 1.04 => unlikely TTR amyloidosis.  - Echo (12/21): EF 60-65%, mild LVH, normal RV, PASP 35 mmHg, s/p bioprosthetic mitral valve with mean gradient 4, IVC normal  7. Anemia: Fe deficiency and anemia of chronic disease.   - Suspect GI AVMs as cause of 2/18 bleed . Coumadin stopped.  8. CAD: LHC (3/18) with 30% LM, 40% proximal RCA.  9. Hyperlipidemia 10. PAD: ABIs 3/21 with 0.85 R, 1.03 L. 50-74% right SFA stenosis, 30-49% left SFA stenosis.  - ABIs (12/21): 0.74 right, 0.99 left.  - ABIs (6/23): 0.44 right, 0.86 left 11. Carotid stenosis: Right CEA in 2012.  - Carotid dopplers (3/21) with 40-59% BICA.  - Carotid  dopplers (6/23) with mild disease.  12. Renal cell CA: s/p left kidney cryoablation  SH: Lives alone in [place].  Active smoker.  No ETOH.   Family History  Problem Relation Age of Onset   Peptic Ulcer Disease Father    Heart attack Father 98       Died of MI at age 78   Heart attack Brother 77       Died of MI at age 56   Obesity Brother    Pneumonia Mother    Healthy Sister    Lupus Daughter    Obsessive Compulsive Disorder Daughter    ROS: All systems reviewed and negative except as per HPI.   Current Outpatient Medications  Medication Sig Dispense Refill   albuterol (PROAIR HFA) 108 (90 Base) MCG/ACT inhaler Inhale 2 puffs into the lungs every 6 (six) hours as needed for shortness of breath. 18 g 5   ALPRAZolam (XANAX) 1 MG tablet TAKE 1 TABLET BY MOUTH EVERY NIGHT AT BEDTIME AS NEEDED FOR INSOMNIA. MAY TAKE 1/2 TABLET BY MOUTH DURING THE DAY FOR ANXIETY 45 tablet 0   benzonatate (TESSALON) 100 MG capsule Take 1 capsule (100 mg total) by mouth 3 (three) times daily as needed for cough. 90 capsule 11   Blood Glucose Monitoring Suppl (ONETOUCH VERIO REFLECT) w/Device KIT Use to check blood sugar 1 time a day 1 kit 1   buPROPion (WELLBUTRIN XL) 150 MG 24 hr tablet Take 1 tablet by mouth every morning 90 tablet 3   clobetasol ointment (TEMOVATE) 0.05 % Apply to affected area(s) 2 times daily as needed for (irritation). 30 g 0   Continuous Blood Gluc Sensor (FREESTYLE LIBRE 3 SENSOR) MISC Place 1 sensor on the skin every 14 days. Use to check glucose continuously 6 each 3   DULoxetine (CYMBALTA) 60 MG capsule Take 1 capsule by mouth twice a day. 180 capsule 3   fluticasone (FLONASE) 50 MCG/ACT nasal spray Place 2 sprays into both nostrils daily. 54 g 3   Fluticasone-Umeclidin-Vilant 100-62.5-25 MCG/ACT AEPB Inhale 1 puff into the lungs daily. 180 each 4   furosemide (LASIX) 80 MG tablet Take 1 tablet by mouth every morning AND 1/2 tablet daily in the afternoon. 135 tablet 3    gabapentin (NEURONTIN) 300 MG capsule Take 2 capsules by mouth 3 times daily. 540 capsule 3   HYDROcodone-acetaminophen (NORCO/VICODIN) 5-325 MG tablet Take 2 tablets by mouth 3 (three) times daily. 180 tablet 0   insulin degludec (TRESIBA FLEXTOUCH) 100 UNIT/ML FlexTouch Pen Inject 43 Units into the skin daily. 12 mL 5   Insulin Pen Needle 32G X 4 MM MISC Use to inject insulin up to 6 times a day 500 each 3   ipratropium-albuterol (DUONEB) 0.5-2.5 (3) MG/3ML SOLN Take 3 mLs by nebulization every 6 (six) hours as needed (shortness of breath, wheezing). 90 mL 3   ketoconazole (NIZORAL) 2 % cream Apply 1 application topically at bedtime as needed for irritation. 60 g  5   Lancets Misc. (ACCU-CHEK FASTCLIX LANCET) KIT Check your blood 4 times a day dx code 250.00 insulin requiring 1 kit 2   lidocaine (LIDODERM) 5 % Place 1 or 2 patches to painful area of back each day.  Remove & Discard patch within 12 hours or as directed by MD 60 patch 2   metoprolol succinate (TOPROL-XL) 50 MG 24 hr tablet Take 1 tablet by mouth daily. Take with or immediately following a meal. 90 tablet 3   nystatin cream (MYCOSTATIN) Apply 1 Application topically as needed for dry skin.     omeprazole (PRILOSEC) 40 MG capsule Take 1 capsule by mouth in the morning and at bedtime. 180 capsule 2   ondansetron (ZOFRAN) 4 MG tablet Take 1 tablet (4 mg total) by mouth every 8 (eight) hours as needed for nausea or vomiting. 21 tablet 0   OXYGEN Inhale 2 L into the lungs at bedtime.     PRESCRIPTION MEDICATION Inhale into the lungs at bedtime. CPAP     rosuvastatin (CRESTOR) 20 MG tablet TAKE 1 TABLET BY MOUTH ONCE EVERY NIGHT AT BEDTIME 90 tablet 3   Semaglutide,0.25 or 0.5MG/DOS, (OZEMPIC, 0.25 OR 0.5 MG/DOSE,) 2 MG/3ML SOPN Inject 0.5 mg into the skin once a week. 3 mL 11   senna-docusate (SENOKOT S) 8.6-50 MG tablet Take 1 to 2 tablets by mouth once or twice a day as needed for constipation 60 tablet 3   warfarin (COUMADIN) 2.5 MG  tablet Take 1 tablet by mouth daily unless otherwise instructed. (Patient taking differently: Take 2.5 mg by mouth daily. Take 1 tablet by mouth daily unless otherwise instructed.) 90 tablet 3   ferrous gluconate (FERGON) 324 MG tablet Take 1 tablet (324 mg total) by mouth daily with breakfast. (Patient not taking: Reported on 08/05/2021) 90 tablet 3   potassium chloride SA (KLOR-CON M) 20 MEQ tablet Take 2 tablets (40 mEq total) by mouth daily. 270 tablet 3   No current facility-administered medications for this encounter.   BP 124/60   Pulse 63   Wt 75.8 kg (167 lb 3.2 oz)   SpO2 94%   BMI 30.58 kg/m  General: NAD Neck: JVP 8-9 cm, no thyromegaly or thyroid nodule.  Lungs: Distant BS, occasional rhonchi CV: Nondisplaced PMI.  Heart regular S1/S2, no S3/S4, no murmur.  No peripheral edema.  No carotid bruit. Unable to palpate pedal pulses.  Abdomen: Soft, nontender, no hepatosplenomegaly, no distention.  Skin: Intact without lesions or rashes.  Neurologic: Alert and oriented x 3.  Psych: Normal affect. Extremities: No clubbing or cyanosis.  HEENT: Normal.   Assessment/Plan: 1. Pulmonary hypertension: I suspect that this is primarily group 2 PH (pulmonary venous hypertension from LV diastolic dysfunction) and group 3 PH (severe COPD, OSA).  V/Q scan showed no evidence for chronic PE.  I do not think that she will benefit from selective pulmonary vasodilators.  I think that the key here will continue to be good diuresis + use of oxygen and CPAP.  2. Chronic diastolic CHF with prominent RV dysfunction in the setting of severe COPD.  PYP scan in 10/21 is NOT suggestive of TTR amyloidosis.  Echo in 12/21 with EF 65-70%, normal RV.  NYHA class IIIb symptoms (dyspnea is likely a combination of CHF and COPD), stable recently.  She is mildly volume overloaded on exam though weight is stable.  - She stopped Jardiance with a severe yeast infection, would not retry.    - Continue Lasix 80 qam/40  qpm  => she frequently skips the afternoon dose, needs to take afternoon dose regularly.  Check BMET/BNP.     - With high normal K, decrease KCl to 40 mEq daily.  3. Atrial fibrillation: Paroxysmal.  She is in regular on exam today.  She had been off anticoagulation due to GI bleeding, now back on warfarin with INR goal 1.5-2.5.  She had GI bleeding in 2/19, no recent melena.  Recent CBC stable.  - Keep warfarin with INR 1.5-2.5 for now.  - With most recent data regarding NOACs and bioprosthetic valves, I suggested in the past that she switch to apixaban given GI bleeding history. She has wanted to stay on warfarin.  4. h/o GI bleeding: Possible GI AVM. Admitted with anemia again in 2/19, got 2 units PRBCs.  No overt bleeding now.  CBC today.  5. COPD: Severe by PFTs. She is on home oxygen. Unfortunately, she is still smoking.  I encouraged her to quit again.  6. CAD: Nonobstructive. She is on a statin.  - Check lipids today.  7. PAD: Hard to separate diabetic neuropathy and joint pain from PAD pain. Severely abnormal ABI on the right in 6/23.  Follows with Dr Trula Slade.  Symptoms are not clearly due to claudication, she has no rest pain or pedal ulcerations.  - Needs to quit smoking but not interested in this.  - Continue statin and warfarin.  - She has tried cilostazol in the past, says it did not help. With significant CHF, this should be avoided.   Followup in 3 months with APP  Loralie Champagne 08/24/2021

## 2021-08-24 NOTE — Addendum Note (Signed)
Encounter addended by: [name], MD on: 08/24/2021 10:50 PM  Actions taken: Clinical Note Signed

## 2021-08-26 ENCOUNTER — Telehealth: Payer: Self-pay | Admitting: Pharmacist

## 2021-08-26 NOTE — Telephone Encounter (Signed)
Dr. Annamaria Boots, please advise on this with what we need to do since DME stated pt's sleep study did not qualify her for CPAP. Pt recently saw SG 06/2021

## 2021-08-26 NOTE — Telephone Encounter (Signed)
Patient reports PST, FS, POC INR as 2.9, trending higher than her usual INR on what typically is a rather fixed-stable dose. She denies any new medications, no changes in diet per se. States she did have an episode of diarrhea about 8 days ago. An observation borne out of the literature years ago:  INRs do have a tendency to be higher in the summer, as patients are "hotter." This is sometimes referred to within the patient as a hypermetabolic state. In this instance, it is the clotting FACTORS that are more readily cleared/eliminated (vs. The warfarin.) She states she is not getting any "error" readings on her device. I will adjust her total weekly warfarin dose down from 13.41m per week to 11.278mwarfarin/wk as follows:   Su-2.62m762m0mgT-1.262mgW-2.62mgTh-1.262mgF-2.62mgSa-1.262mg  Using her 2.62mg42mrength warfarin tablets. She will repeat INR next Monday, 24-JUL-23.

## 2021-08-26 NOTE — Telephone Encounter (Signed)
Patient calling back to check on CPAP machine- please advise, call back number [phone].

## 2021-08-27 NOTE — Telephone Encounter (Signed)
Left message for patient to call back.

## 2021-08-27 NOTE — Telephone Encounter (Signed)
I don't have a way to get her a CPAP machine since her sleep study doesn't meet insurance criteria to show she needs it. She is used to sleeping with it, but doesn't need it medically. She could look into paying out of pocket for one- may be a used one on-line, from [employer] or through her DME company. We could could get it re-set to her presures if he can find a machine.

## 2021-09-02 ENCOUNTER — Telehealth: Payer: Self-pay | Admitting: Pharmacist

## 2021-09-02 NOTE — Telephone Encounter (Signed)
Called patient and she states that back in May 2023 she got an approval letter through her insurance for cpap.   So I called [facility] and was notified that patient was possibility approved for a bipap machine if she could have documentation of the following tests.  ABGs ONO  Please advise Dr Annamaria Boots on what you would like to do further.

## 2021-09-02 NOTE — Telephone Encounter (Signed)
Patient is returning phone call. Patient phone number is [phone]. 

## 2021-09-02 NOTE — Telephone Encounter (Signed)
Patient provided result of PST FS POC INR today = 1.4 on 11.74m warfarin per WEEK. Increasing to 12.534mwarfarin per WEEK as:Su-2.16m50m0mgT-2.16mgW-2.16mgTh-1.216mgF-2.16mgSa-1.216mg Repeat PST FS POC INR Monday 31-JUL-23.

## 2021-09-02 NOTE — Telephone Encounter (Signed)
Patient reports results of PST, FS, POC INR performed today = 1.4 on 11.61m warfarin/wk. INCREASED to 12.556mwarfarin/wk. Repeat PST POC FS INR Monday 31-JUL-23.

## 2021-09-02 NOTE — Telephone Encounter (Signed)
Called patient and left message for her to call office back with CY recommendations for her cpap

## 2021-09-02 NOTE — Telephone Encounter (Signed)
Patient states insurance approved CPAP machine. Approval 06/24/2021 thru 12/25/2021. Patient phone number is [phone].

## 2021-09-02 NOTE — Telephone Encounter (Signed)
INTERNAL MEDICINE TEACHING ATTENDING ADDENDUM   I agree with pharmacy recommendations as outlined in their note.   Dorian Pod, MD

## 2021-09-05 NOTE — Telephone Encounter (Signed)
Patient would like to speak to Eric Form NP only. Patient phone number is [phone].

## 2021-09-07 ENCOUNTER — Other Ambulatory Visit: Payer: Self-pay | Admitting: Internal Medicine

## 2021-09-07 ENCOUNTER — Other Ambulatory Visit: Payer: Self-pay

## 2021-09-07 ENCOUNTER — Other Ambulatory Visit (HOSPITAL_COMMUNITY): Payer: Self-pay

## 2021-09-07 ENCOUNTER — Other Ambulatory Visit: Payer: Self-pay | Admitting: Cardiology

## 2021-09-07 DIAGNOSIS — I5032 Chronic diastolic (congestive) heart failure: Secondary | ICD-10-CM

## 2021-09-09 ENCOUNTER — Other Ambulatory Visit: Payer: Self-pay

## 2021-09-09 ENCOUNTER — Other Ambulatory Visit (HOSPITAL_COMMUNITY): Payer: Self-pay

## 2021-09-09 ENCOUNTER — Telehealth: Payer: Self-pay | Admitting: Pharmacist

## 2021-09-09 DIAGNOSIS — I480 Paroxysmal atrial fibrillation: Secondary | ICD-10-CM | POA: Diagnosis not present

## 2021-09-09 DIAGNOSIS — Z7901 Long term (current) use of anticoagulants: Secondary | ICD-10-CM | POA: Diagnosis not present

## 2021-09-09 DIAGNOSIS — F419 Anxiety disorder, unspecified: Secondary | ICD-10-CM

## 2021-09-09 MED ORDER — CLOBETASOL PROPIONATE 0.05 % EX OINT
1.0000 | TOPICAL_OINTMENT | Freq: Two times a day (BID) | CUTANEOUS | 5 refills | Status: DC | PRN
  Filled 2021-09-09: qty 30, 15d supply, fill #0
  Filled 2021-12-22 – 2022-01-24 (×2): qty 30, 15d supply, fill #1

## 2021-09-09 NOTE — Telephone Encounter (Signed)
Patient reports results of PST FS POC INR result of 1.7 on 12.5 milligrams of warfarin PER WEEK as: Su-2.52mM-0mgT-2.5mgW-2.5mgTh-1.25mgF-2.5mgSa-1.25mg. Will continue this regimen, as it is in target range defined for patient (1.5 - 2.5).  Repeat PST in one week, Monday, September 16, 2021. Denies any episodes of bleeding, no new medications, no missed doses.

## 2021-09-10 ENCOUNTER — Other Ambulatory Visit (HOSPITAL_COMMUNITY): Payer: Self-pay

## 2021-09-10 MED ORDER — ALPRAZOLAM 1 MG PO TABS
ORAL_TABLET | ORAL | 0 refills | Status: DC
  Filled 2021-09-10: qty 45, fill #0
  Filled 2021-09-20: qty 45, 30d supply, fill #0

## 2021-09-10 MED ORDER — FUROSEMIDE 80 MG PO TABS
ORAL_TABLET | ORAL | 3 refills | Status: DC
  Filled 2021-09-10 – 2021-10-22 (×2): qty 135, 90d supply, fill #0
  Filled 2021-12-22 – 2022-01-24 (×2): qty 135, 90d supply, fill #1
  Filled 2022-03-13 – 2022-04-02 (×2): qty 135, 90d supply, fill #2
  Filled 2022-08-07: qty 135, 90d supply, fill #3

## 2021-09-11 NOTE — Telephone Encounter (Signed)
INTERNAL MEDICINE TEACHING ATTENDING ADDENDUM   I agree with pharmacy recommendations as outlined in their note.   Dorian Pod, MD

## 2021-09-16 ENCOUNTER — Telehealth: Payer: Self-pay | Admitting: Pharmacist

## 2021-09-16 ENCOUNTER — Ambulatory Visit: Payer: HMO | Admitting: Podiatry

## 2021-09-16 NOTE — Telephone Encounter (Signed)
Patient reports PST FS POC INR result of 1.7 (goal 1.5 - 2.5) on 2.46m warfarin Tu/We/Fr/Su; 1.278mon Th/Sa; 70m49mn Mondays. CONTINUE same regimen. Repeat INR 14-AUG-23. No bleeding, no new medications.

## 2021-09-19 ENCOUNTER — Ambulatory Visit (INDEPENDENT_AMBULATORY_CARE_PROVIDER_SITE_OTHER): Payer: HMO | Admitting: Internal Medicine

## 2021-09-19 ENCOUNTER — Encounter: Payer: Self-pay | Admitting: Internal Medicine

## 2021-09-19 ENCOUNTER — Other Ambulatory Visit (HOSPITAL_COMMUNITY): Payer: Self-pay

## 2021-09-19 ENCOUNTER — Other Ambulatory Visit: Payer: Self-pay

## 2021-09-19 VITALS — BP 139/40 | HR 54 | Temp 98.1°F

## 2021-09-19 DIAGNOSIS — D509 Iron deficiency anemia, unspecified: Secondary | ICD-10-CM | POA: Diagnosis not present

## 2021-09-19 DIAGNOSIS — J310 Chronic rhinitis: Secondary | ICD-10-CM

## 2021-09-19 DIAGNOSIS — E1122 Type 2 diabetes mellitus with diabetic chronic kidney disease: Secondary | ICD-10-CM | POA: Diagnosis not present

## 2021-09-19 DIAGNOSIS — Z79891 Long term (current) use of opiate analgesic: Secondary | ICD-10-CM

## 2021-09-19 DIAGNOSIS — E1140 Type 2 diabetes mellitus with diabetic neuropathy, unspecified: Secondary | ICD-10-CM | POA: Diagnosis not present

## 2021-09-19 DIAGNOSIS — R110 Nausea: Secondary | ICD-10-CM | POA: Diagnosis not present

## 2021-09-19 DIAGNOSIS — F1190 Opioid use, unspecified, uncomplicated: Secondary | ICD-10-CM

## 2021-09-19 DIAGNOSIS — N183 Chronic kidney disease, stage 3 unspecified: Secondary | ICD-10-CM

## 2021-09-19 DIAGNOSIS — J449 Chronic obstructive pulmonary disease, unspecified: Secondary | ICD-10-CM

## 2021-09-19 DIAGNOSIS — G8929 Other chronic pain: Secondary | ICD-10-CM

## 2021-09-19 DIAGNOSIS — I129 Hypertensive chronic kidney disease with stage 1 through stage 4 chronic kidney disease, or unspecified chronic kidney disease: Secondary | ICD-10-CM

## 2021-09-19 DIAGNOSIS — I2723 Pulmonary hypertension due to lung diseases and hypoxia: Secondary | ICD-10-CM | POA: Diagnosis not present

## 2021-09-19 DIAGNOSIS — Z720 Tobacco use: Secondary | ICD-10-CM

## 2021-09-19 DIAGNOSIS — M5441 Lumbago with sciatica, right side: Secondary | ICD-10-CM | POA: Diagnosis not present

## 2021-09-19 DIAGNOSIS — G479 Sleep disorder, unspecified: Secondary | ICD-10-CM

## 2021-09-19 DIAGNOSIS — R0981 Nasal congestion: Secondary | ICD-10-CM

## 2021-09-19 DIAGNOSIS — F321 Major depressive disorder, single episode, moderate: Secondary | ICD-10-CM

## 2021-09-19 DIAGNOSIS — S2232XD Fracture of one rib, left side, subsequent encounter for fracture with routine healing: Secondary | ICD-10-CM

## 2021-09-19 DIAGNOSIS — Z794 Long term (current) use of insulin: Secondary | ICD-10-CM

## 2021-09-19 DIAGNOSIS — I130 Hypertensive heart and chronic kidney disease with heart failure and stage 1 through stage 4 chronic kidney disease, or unspecified chronic kidney disease: Secondary | ICD-10-CM

## 2021-09-19 DIAGNOSIS — I10 Essential (primary) hypertension: Secondary | ICD-10-CM

## 2021-09-19 DIAGNOSIS — R2681 Unsteadiness on feet: Secondary | ICD-10-CM

## 2021-09-19 DIAGNOSIS — F17210 Nicotine dependence, cigarettes, uncomplicated: Secondary | ICD-10-CM

## 2021-09-19 MED ORDER — OMEPRAZOLE 40 MG PO CPDR
DELAYED_RELEASE_CAPSULE | ORAL | 2 refills | Status: DC
  Filled 2021-09-19: qty 180, fill #0
  Filled 2021-10-22: qty 180, 90d supply, fill #0
  Filled 2021-12-22 – 2022-01-24 (×2): qty 180, 90d supply, fill #1
  Filled 2022-03-13 – 2022-04-02 (×2): qty 180, 90d supply, fill #2

## 2021-09-19 MED ORDER — HYDROCODONE-ACETAMINOPHEN 5-325 MG PO TABS
2.0000 | ORAL_TABLET | Freq: Three times a day (TID) | ORAL | 0 refills | Status: DC
  Filled 2021-09-19: qty 180, 30d supply, fill #0

## 2021-09-19 MED ORDER — GABAPENTIN 300 MG PO CAPS
ORAL_CAPSULE | ORAL | 3 refills | Status: DC
  Filled 2021-09-19: qty 540, fill #0
  Filled 2021-10-10: qty 270, 90d supply, fill #0
  Filled 2021-12-22 – 2022-01-07 (×2): qty 270, 90d supply, fill #1
  Filled 2022-04-03: qty 270, 90d supply, fill #2

## 2021-09-19 MED ORDER — CETIRIZINE HCL 10 MG PO TABS
10.0000 mg | ORAL_TABLET | Freq: Every day | ORAL | 3 refills | Status: DC
  Filled 2021-09-19: qty 90, 90d supply, fill #0
  Filled 2021-12-15 – 2021-12-26 (×3): qty 90, 90d supply, fill #1
  Filled 2022-01-24 – 2022-03-13 (×2): qty 90, 90d supply, fill #2
  Filled 2022-04-15 – 2022-06-26 (×3): qty 90, 90d supply, fill #3
  Filled 2022-06-30: qty 30, 30d supply, fill #3
  Filled 2022-08-12: qty 30, 30d supply, fill #4
  Filled 2022-09-09: qty 30, 30d supply, fill #5

## 2021-09-19 MED ORDER — ONDANSETRON HCL 4 MG PO TABS
4.0000 mg | ORAL_TABLET | Freq: Three times a day (TID) | ORAL | 0 refills | Status: DC | PRN
  Filled 2021-09-19: qty 21, 28d supply, fill #0

## 2021-09-19 NOTE — Assessment & Plan Note (Signed)
Fragmented non-restorative sleep around the clock.  High dose gabapentin during the day a likely contributor.  SHe will reduce daytime dosing.  Depression and apathy are also playing a role.

## 2021-09-19 NOTE — Assessment & Plan Note (Signed)
Feet are sensate to monofilament testing today.  She experiences burning lower leg and foot pain, worse at night.  On gabapentin which is currently causing unwanted daytime sedation; she will reduce daytime dosing to 300 mg in the morning.

## 2021-09-19 NOTE — Assessment & Plan Note (Signed)
Typically well controlled; elevated systolic today 643 though diastolic 40.  No changes at this time.

## 2021-09-19 NOTE — Assessment & Plan Note (Signed)
Remains uninterested in cessation.

## 2021-09-19 NOTE — Assessment & Plan Note (Signed)
Recent visit at CHF clinic:  Maintain volume control with lasix 80/40 daily.  Currently compensated.

## 2021-09-19 NOTE — Assessment & Plan Note (Signed)
>>  ASSESSMENT AND PLAN FOR CHRONIC NASAL CONGESTION WRITTEN ON 09/19/2021  1:45 PM BY Angelica Pou, MD  Stuffy nose with clear mucous, no other symptoms.  Trial antihistamine.

## 2021-09-19 NOTE — Assessment & Plan Note (Signed)
Symptoms recently associated with loss of functional mobility.  On bupropion and duloxetine.  Her therapist has moved to another practice.

## 2021-09-19 NOTE — Assessment & Plan Note (Signed)
Minimal pain, able to inspire fully.  No longer requiring analgesia.

## 2021-09-19 NOTE — Assessment & Plan Note (Signed)
CKD 3a based on labs 08/2021.  Stable.

## 2021-09-19 NOTE — Progress Notes (Addendum)
Routine f/u for [name].  She brings to my attention:  Fragmented nonrefreshing sleep, excessive daytime drowsiness.  Leading to low activity - rarely gets dressed for the day unless going out for an  appointment.     Low household activity - mobility hampered by balance difficulties.  Passageways not wide enough to accomodate a walker.  She steadies against furniture and the walls.  Very unsteady.  Completed home PT.  Hx of parotid stone many years ago, which she recalled when she developed acute painful swelling at the angle of L mandible recently, self-resolved overnight, no problems since.     Cardiology f/u; dose of potassium supplement reduced, no other changes.  Constantly blowing nose, nasal congestion, clear mucous.  Weeks to months, not "ill". No associaed symptoms.  Physical exam: BP (!) 139/40 (BP Location: Left Arm, Cuff Size: Normal)   Pulse (!) 54   Temp 98.1 F (36.7 C) (Oral)   SpO2 96%  RA Awake and alert during visit.  Good spirits, though frustrated with health conditions.  Mild tenderness and fullness beneath angle of jaw over parotid gland, no increased warmth or erythema, no difficulty with head/neck mobility, no buccal mucosa lesion. LE's w/o edema.  Feet are at their baseline - very dusky toes, slightly cool, pulseless.  Sensation intact to monofilament testing.  No open areas though soles are dry and scaly.  Pushes with arms to arise from chair slowly.  Very unsteady gait with tendency for LLE to give way, 2 person assist.  Walking about 10 steps elicited dyspnea.    Assessment and plan: Primary focus today is on minimizing daytime somnolence which is likely due to polypharmacy.  Reduce gabapentin during the day.  Other issues as follows:  Pulmonary hypertension due to chronic obstructive pulmonary disease ([place]) Recent visit at CHF clinic:  Maintain volume control with lasix 80/40 daily.  Currently compensated.   Type 2 diabetes mellitus with diabetic  neuropathy (HCC) Feet are sensate to monofilament testing today.  She experiences burning lower leg and foot pain, worse at night.  On gabapentin which is currently causing unwanted daytime sedation; she will reduce daytime dosing to 300 mg in the morning. Not yet due for A1C.  Closed fracture of one rib of left side Minimal pain, able to inspire fully.  No longer requiring analgesia.    Chronic iron deficiency anemia Advised to resume oral iron therapy.  She has medication for any subsequent constipation.    Chronic, continuous use of opioids for chronic pain No changes in therapy.  Adherent to regimen.  Moderate major depression (HCC) Symptoms recently associated with loss of functional mobility.  On bupropion and duloxetine.  Her therapist has moved to another practice.    Sleep disturbance Fragmented non-restorative sleep around the clock.  High dose gabapentin during the day a likely contributor.  SHe will reduce daytime dosing.  Depression and apathy are also playing a role.  Tobacco abuse Remains uninterested in cessation.  Benign hypertensive heart and CKD, stage 3 (GFR 30-59), w CHF ([place]) CKD 3a based on labs 08/2021.  Stable.    Essential hypertension Typically well controlled; elevated systolic today 295 though diastolic 40.  No changes at this time.  Chronic nasal congestion Stuffy nose with clear mucous, no other symptoms.  Trial antihistamine.   Gait instability Mobility gradually declining.  Able to arise by pushing off with hands from armed chair.  Very unsteady standing and walking balance, with tendency for LLE to give way (she  can't distinguish if due to pain or weakness).  Two-person assist to walk about 10 steps.  Has recently received home PT.  No room in the home to accommodate a walker; she steadies herself against walls and furniture and has reduced her transfers generally.  Bathroom has safety bars, though again can't get a walker into the room.  Lives alone.   Concern for adequate support in the home to meet her needs as time progresses.    F/u 4-6 wks for close monitoring and declining physical function.

## 2021-09-19 NOTE — Assessment & Plan Note (Signed)
Advised to resume oral iron therapy.  She has medication for any subsequent constipation.

## 2021-09-19 NOTE — Assessment & Plan Note (Signed)
Mobility gradually declining.  Able to arise by pushing off with hands from armed chair.  Very unsteady standing and walking balance, with tendency for LLE to give way (she can't distinguish if due to pain or weakness).  Two-person assist to walk about 10 steps.  Has recently received home PT.  No room in the home to accommodate a walker; she steadies herself against walls and furniture and has reduced her transfers generally.  Bathroom has safety bars, though again can't get a walker into the room.  Lives alone.  Concern for adequate support in the home to meet her needs as time progresses.

## 2021-09-19 NOTE — Assessment & Plan Note (Signed)
Stuffy nose with clear mucous, no other symptoms.  Trial antihistamine.

## 2021-09-19 NOTE — Assessment & Plan Note (Signed)
No changes in therapy.  Adherent to regimen.

## 2021-09-19 NOTE — Patient Instructions (Addendum)
[name],  Kermit Balo to see you!  We talked about your sleepiness during the day.  The most likely culprit is the neurontin.  Please  decrease the daytime dose down to 1 in the morning, keep the evening dose 2 tabs.  Let's see if you feel more awake.  Try not to nap if possible.  We reviewed your medicines and corrected your fungal infection treatments.  You are going to speak to Dr. Annamaria Boots about your CPAP.  I prescribed an antihistamine to see if it will help with your nasal symptoms.  Let's get together in 4-6 weeks !  Take care and stay well,  Dr. [name]

## 2021-09-20 ENCOUNTER — Other Ambulatory Visit (HOSPITAL_COMMUNITY): Payer: Self-pay

## 2021-09-23 ENCOUNTER — Telehealth: Payer: Self-pay | Admitting: Pharmacist

## 2021-09-23 NOTE — Telephone Encounter (Signed)
Patient reports PST FS POC INR performed this morning as 1.8 (goal for this patient is 1.5 - 2.5.) Will continue same regimen: 2.7m Su/Tu/We/Fri; 1.252mTh/Sat; 82m79mn Monday. Repeat INR 21-AUG-23. No bleeding. No other symptoms or complaints.

## 2021-09-26 ENCOUNTER — Telehealth: Payer: Self-pay | Admitting: Acute Care

## 2021-09-26 ENCOUNTER — Other Ambulatory Visit (HOSPITAL_COMMUNITY): Payer: Self-pay

## 2021-09-26 DIAGNOSIS — G4734 Idiopathic sleep related nonobstructive alveolar hypoventilation: Secondary | ICD-10-CM

## 2021-09-26 NOTE — Telephone Encounter (Signed)
Order placed for cpap to be sent to [facility]. Pt was having too  many issues with [facility]. Order approved by Judson Roch. Nothing further needed

## 2021-09-26 NOTE — Telephone Encounter (Signed)
Called case manager and she states that she has been having so many issues with [facility] and getting the cpap. Cpap order was placed  in May.   Judson Roch are you ok if we send the cpap order to [facility] instead.   Please advise

## 2021-09-30 ENCOUNTER — Telehealth: Payer: Self-pay | Admitting: Pharmacist

## 2021-09-30 ENCOUNTER — Telehealth: Payer: Self-pay | Admitting: Acute Care

## 2021-09-30 NOTE — Telephone Encounter (Signed)
Order for CPAP was placed on 09/26/21 and was sent to [facility]. Nothing further needed

## 2021-09-30 NOTE — Telephone Encounter (Signed)
Patient provides INR results of patient self testing, finger stick, point of care = 1.7 (goal 1.5 - 2.5) on 2.5m Su/Tu/We/Fri; 1.25mon Th/Sa; 75m62mn Mondays. CONTINUE SAME regimen. Repeat INR 28-AUG-23.

## 2021-10-02 ENCOUNTER — Other Ambulatory Visit (HOSPITAL_COMMUNITY): Payer: Self-pay

## 2021-10-07 DIAGNOSIS — Z7901 Long term (current) use of anticoagulants: Secondary | ICD-10-CM | POA: Diagnosis not present

## 2021-10-07 DIAGNOSIS — I480 Paroxysmal atrial fibrillation: Secondary | ICD-10-CM | POA: Diagnosis not present

## 2021-10-10 ENCOUNTER — Other Ambulatory Visit (HOSPITAL_COMMUNITY): Payer: Self-pay

## 2021-10-10 ENCOUNTER — Other Ambulatory Visit: Payer: Self-pay | Admitting: Internal Medicine

## 2021-10-10 DIAGNOSIS — B372 Candidiasis of skin and nail: Secondary | ICD-10-CM

## 2021-10-11 ENCOUNTER — Other Ambulatory Visit (HOSPITAL_COMMUNITY): Payer: Self-pay

## 2021-10-11 DIAGNOSIS — G4733 Obstructive sleep apnea (adult) (pediatric): Secondary | ICD-10-CM | POA: Diagnosis not present

## 2021-10-13 IMAGING — NM NM SCAN TUMOR LOCALIZE WITH SPECT
3 series · 18 of 18 positions shown · non-contrast
Comparison: none

CLINICAL DATA: HEART FAILURE. CONCERN FOR CARDIAC AMYLOIDOSIS.

EXAM:
NUCLEAR MEDICINE TUMOR LOCALIZATION. PYP CARDIAC AMYLOIDOSIS SCAN
WITH SPECT
TECHNIQUE: Following intravenous administration of radiopharmaceutical,
anterior planar images of the chest were obtained. Regions of
interest were placed on the heart and contralateral chest wall for
quantitative assessment. Additional SPECT imaging of the chest was
obtained.
RADIOPHARMACEUTICALS:  20.1 mCi TECHNETIUM 99 PYROPHOSPHATE

[Series 1: amyloid spect_(id)_cor · 4.1mm · 4.14mm/px · 6 of 128 frames shown]
[frame 11/128]
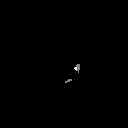
[frame 32/128]
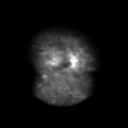
[frame 54/128]
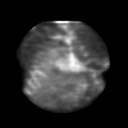
[frame 75/128]
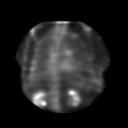
[frame 96/128]
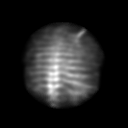
[frame 118/128]
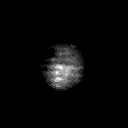

[Series 1: amyloid spect_(id)_tra · 4.1mm · 4.14mm/px · 6 of 128 frames shown]
[frame 11/128]
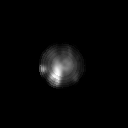
[frame 32/128]
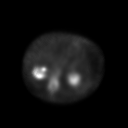
[frame 54/128]
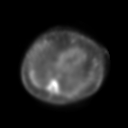
[frame 75/128]
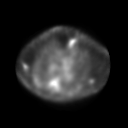
[frame 96/128]
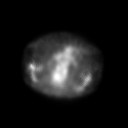
[frame 118/128]
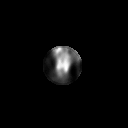

[Series 2: amyloid spect · 4.14mm/px · 6 of 64 frames shown]
[frame 6/64]
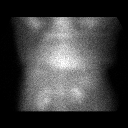
[frame 16/64]
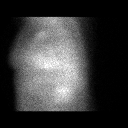
[frame 27/64]
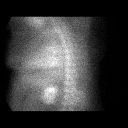
[frame 38/64]
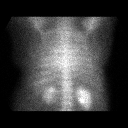
[frame 48/64]
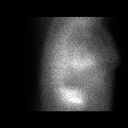
[frame 59/64]
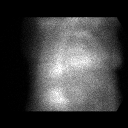

[18 of 18 positions shown; findings below may reference images not displayed]

FINDINGS: Planar Visual assessment:

Anterior planar imaging demonstrates radiotracer uptake within the
heart equal to uptake within the adjacent ribs (Grade 2).

Quantitative assessment :

Quantitative assessment of the cardiac uptake compared to the
contralateral chest wall is equal to 1.3 (H/CL = 1.3).

SPECT assessment: SPECT imaging of the chest demonstrates clear
radiotracer accumulation within the LEFT ventricle.
IMPRESSION: Visual and quantitative assessment (grade 2, H/CLL equal 1.3) are
concerning for transthyretin amyloidosis. Consider repeat exam in
the future.

## 2021-10-15 ENCOUNTER — Other Ambulatory Visit (HOSPITAL_COMMUNITY): Payer: Self-pay

## 2021-10-15 ENCOUNTER — Telehealth: Payer: Self-pay | Admitting: Internal Medicine

## 2021-10-15 NOTE — Telephone Encounter (Signed)
Notified patient of info below. She will take Ozempic injection when it arrives tomorrow, and resume weekly Monday injections.

## 2021-10-15 NOTE — Telephone Encounter (Signed)
Returned call to patient. States she called [facility] on 8/31 for refill on Ozempic but has not received it yet. They say it will be in tomorrow. Patient missed weekly dose on Monday 9/4. She is wanting to know if she should take it tomorrow when it arrives or skip this week's dose. If she can take it tomorrow, will next dose be due Monday 9/11 or Wednesday 9/13? Please advise.

## 2021-10-15 NOTE — Telephone Encounter (Signed)
Would recommend to go ahead and give dose when it arrives, given that she is on a lower dose of ozempic she may resume next dose on the following Monday (would not receive an excessive dose), however would also be fine to change to Wednesday injections.

## 2021-10-15 NOTE — Telephone Encounter (Signed)
Pls call the pt in regards to her diabetes and her medications.

## 2021-10-16 MED ORDER — NYSTATIN 100000 UNIT/GM EX POWD
1.0000 | Freq: Three times a day (TID) | CUTANEOUS | 4 refills | Status: DC
  Filled 2021-10-16: qty 60, 20d supply, fill #0
  Filled 2021-11-05: qty 60, 20d supply, fill #1
  Filled 2021-12-15 – 2022-01-24 (×3): qty 60, 20d supply, fill #2
  Filled 2022-02-11 – 2022-04-03 (×4): qty 60, 20d supply, fill #3
  Filled 2022-04-19 – 2022-05-18 (×2): qty 60, 20d supply, fill #4

## 2021-10-17 ENCOUNTER — Telehealth: Payer: Self-pay | Admitting: *Deleted

## 2021-10-17 ENCOUNTER — Other Ambulatory Visit (HOSPITAL_COMMUNITY): Payer: Self-pay

## 2021-10-17 NOTE — Telephone Encounter (Signed)
Received fax from [facility]. INR result 1.6; done 10/14/21 @ 0900 am.

## 2021-10-22 ENCOUNTER — Other Ambulatory Visit (HOSPITAL_COMMUNITY): Payer: Self-pay

## 2021-10-22 ENCOUNTER — Other Ambulatory Visit: Payer: Self-pay

## 2021-10-23 ENCOUNTER — Other Ambulatory Visit: Payer: Self-pay | Admitting: *Deleted

## 2021-10-23 ENCOUNTER — Other Ambulatory Visit (HOSPITAL_COMMUNITY): Payer: Self-pay

## 2021-10-23 DIAGNOSIS — F419 Anxiety disorder, unspecified: Secondary | ICD-10-CM

## 2021-10-23 DIAGNOSIS — G8929 Other chronic pain: Secondary | ICD-10-CM

## 2021-10-23 NOTE — Telephone Encounter (Signed)
Next appt scheduled 11/07/21.

## 2021-10-24 ENCOUNTER — Other Ambulatory Visit (HOSPITAL_COMMUNITY): Payer: Self-pay

## 2021-10-24 MED ORDER — ALPRAZOLAM 1 MG PO TABS
ORAL_TABLET | ORAL | 0 refills | Status: DC
  Filled 2021-10-24: qty 45, 30d supply, fill #0

## 2021-10-24 MED ORDER — HYDROCODONE-ACETAMINOPHEN 5-325 MG PO TABS
2.0000 | ORAL_TABLET | Freq: Three times a day (TID) | ORAL | 0 refills | Status: DC
  Filled 2021-10-24: qty 180, 30d supply, fill #0

## 2021-10-25 ENCOUNTER — Other Ambulatory Visit (HOSPITAL_COMMUNITY): Payer: Self-pay

## 2021-10-28 ENCOUNTER — Telehealth: Payer: Self-pay | Admitting: Pharmacist

## 2021-10-28 NOTE — Telephone Encounter (Signed)
Patient reports PST FS POC INR result of 2.6 on 12.53m warfarin/wk. Will reduce to 11.227mas:  Su-2.59m21m0mgT-1.259mgW-2.59mgTh-1.259mgF-2.59mgSa-1.259mg. No bleeding reported. No new medications. Repeat INR 25-SEP-23.

## 2021-10-30 ENCOUNTER — Emergency Department (HOSPITAL_COMMUNITY): Payer: HMO

## 2021-10-30 ENCOUNTER — Other Ambulatory Visit: Payer: Self-pay

## 2021-10-30 ENCOUNTER — Emergency Department (HOSPITAL_COMMUNITY)
Admission: EM | Admit: 2021-10-30 | Discharge: 2021-10-30 | Disposition: A | Discharge status: Home / Self Care | Payer: HMO | Attending: Emergency Medicine | Admitting: Emergency Medicine

## 2021-10-30 ENCOUNTER — Encounter (HOSPITAL_COMMUNITY): Payer: Self-pay | Admitting: Emergency Medicine

## 2021-10-30 DIAGNOSIS — F17210 Nicotine dependence, cigarettes, uncomplicated: Secondary | ICD-10-CM | POA: Insufficient documentation

## 2021-10-30 DIAGNOSIS — M542 Cervicalgia: Secondary | ICD-10-CM | POA: Diagnosis not present

## 2021-10-30 DIAGNOSIS — Z7901 Long term (current) use of anticoagulants: Secondary | ICD-10-CM | POA: Diagnosis not present

## 2021-10-30 DIAGNOSIS — S0990XA Unspecified injury of head, initial encounter: Secondary | ICD-10-CM | POA: Diagnosis not present

## 2021-10-30 DIAGNOSIS — R062 Wheezing: Secondary | ICD-10-CM | POA: Insufficient documentation

## 2021-10-30 DIAGNOSIS — R519 Headache, unspecified: Secondary | ICD-10-CM | POA: Diagnosis not present

## 2021-10-30 DIAGNOSIS — W07XXXA Fall from chair, initial encounter: Secondary | ICD-10-CM | POA: Insufficient documentation

## 2021-10-30 DIAGNOSIS — W19XXXA Unspecified fall, initial encounter: Secondary | ICD-10-CM

## 2021-10-30 DIAGNOSIS — J449 Chronic obstructive pulmonary disease, unspecified: Secondary | ICD-10-CM | POA: Insufficient documentation

## 2021-10-30 MED ORDER — ACETAMINOPHEN 325 MG PO TABS: 650.0000 mg | ORAL_TABLET | Freq: Once | ORAL | Status: DC

## 2021-10-30 MED ORDER — MORPHINE SULFATE (PF) 4 MG/ML IV SOLN
6.0000 mg | Freq: Once | INTRAVENOUS | Status: AC
  Administered 2021-10-30: 6 mg via INTRAVENOUS
  Filled 2021-10-30: qty 2

## 2021-10-30 NOTE — ED Notes (Signed)
Patient transported to CT 

## 2021-10-30 NOTE — ED Provider Notes (Signed)
Patient started by Dr. Karle Starch pending results of CT of head neck which per my review and interpretation showed no acute fracture.  Patient with morphine for pain.  Will discharge   Lacretia Leigh, MD 10/30/21 [phone]

## 2021-10-30 NOTE — ED Provider Notes (Signed)
[facility] EMERGENCY DEPARTMENT  Provider Note  CSN: [id number] Arrival date & time: 10/30/21 3668  History Chief Complaint  Patient presents with   [name] is a 71 y.o. female who is on coumadin for PAF reports she fell asleep in her chair and fell out, hitting her head on a bag of cat food. No LOC. No skin breaks. She denies any current pain but called EMS due to the head injury. She was placed in a C-collar. Also noted to be wheezing, has a history of COPD and continues to smoke. Wears oxygen at night. Given a neb enroute.   Home Medications Prior to Admission medications   Medication Sig Start Date End Date Taking? Authorizing Provider  albuterol (PROAIR HFA) 108 (90 Base) MCG/ACT inhaler Inhale 2 puffs into the lungs every 6 (six) hours as needed for shortness of breath. 09/17/20   Sid Falcon, MD  ALPRAZolam [name]) 1 MG tablet TAKE 1 TABLET BY MOUTH EVERY NIGHT AT BEDTIME AS NEEDED FOR INSOMNIA. MAY TAKE 1/2 TABLET BY MOUTH DURING THE DAY FOR ANXIETY 10/24/21   Angelica Pou, MD  Bacillus Coagulans-Inulin (ALIGN PREBIOTIC-PROBIOTIC PO) Take by mouth.    [provider]  benzonatate (TESSALON) 100 MG capsule Take 1 capsule (100 mg total) by mouth 3 (three) times daily as needed for cough. 08/02/20   Angelica Pou, MD  Blood Glucose Monitoring Suppl [facility] VERIO REFLECT) w/Device KIT Use to check blood sugar 1 time a day 05/29/21   [name], MD  buPROPion (WELLBUTRIN XL) 150 MG 24 hr tablet Take 1 tablet by mouth every morning 01/25/21 01/25/22  Angelica Pou, MD  cetirizine (ZYRTEC ALLERGY) 10 MG tablet Take 1 tablet (10 mg total) by mouth daily. 09/19/21 09/19/22  Angelica Pou, MD  clobetasol ointment (TEMOVATE) 0.05 % Apply to affected area(s) 2 times daily as needed for (irritation). 09/09/21   Angelica Pou, MD  Continuous Blood Gluc Sensor (FREESTYLE LIBRE 3 SENSOR) MISC Place 1 sensor  on the skin every 14 days. Use to check glucose continuously 03/06/21   Angelica Pou, MD  DULoxetine (CYMBALTA) 60 MG capsule Take 1 capsule by mouth twice a day. 01/30/21     ferrous gluconate (FERGON) 324 MG tablet Take 1 tablet (324 mg total) by mouth daily with breakfast. Patient not taking: Reported on 08/05/2021 06/13/21   Angelica Pou, MD  fluticasone [facility]) 50 MCG/ACT nasal spray Place 2 sprays into both nostrils daily. 06/24/21   Angelica Pou, MD  Fluticasone-Umeclidin-Vilant 100-62.5-25 MCG/ACT AEPB Inhale 1 puff into the lungs daily. 01/28/21   Deneise Lever, MD  furosemide (LASIX) 80 MG tablet Take 1 tablet by mouth every morning AND 1/2 tablet daily in the afternoon. 09/10/21   [name], MD  gabapentin (NEURONTIN) 300 MG capsule Take 1 capsule in the morning and 2 capsules at night 09/19/21   Angelica Pou, MD  HYDROcodone-acetaminophen (NORCO/VICODIN) 5-325 MG tablet Take 2 tablets by mouth 3 (three) times daily. 10/24/21   Angelica Pou, MD  insulin degludec (TRESIBA FLEXTOUCH) 100 UNIT/ML FlexTouch Pen Inject 43 Units into the skin daily. 08/01/21 01/19/22  Angelica Pou, MD  Insulin Pen Needle 32G X 4 MM MISC Use to inject insulin up to 6 times a day 08/04/17   [name], MD  ipratropium-albuterol (DUONEB) 0.5-2.5 (3) MG/3ML SOLN Take 3 mLs by nebulization every 6 (six) hours as needed (shortness  of breath, wheezing). 11/12/20   Angelica Pou, MD  ketoconazole (NIZORAL) 2 % cream Apply 1 application topically at bedtime as needed for irritation. 07/17/21   Angelica Pou, MD  Lancets Misc. (ACCU-CHEK FASTCLIX LANCET) KIT Check your blood 4 times a day dx code 250.00 insulin requiring 01/19/13   [name], MD  lidocaine (LIDODERM) 5 % Place 1 or 2 patches to painful area of back each day.  Remove & Discard patch within 12 hours or as directed by MD 02/18/21   Angelica Pou, MD  metoprolol succinate (TOPROL-XL)  50 MG 24 hr tablet Take 1 tablet by mouth daily. Take with or immediately following a meal. 01/25/21   Angelica Pou, MD  nystatin (MYCOSTATIN/NYSTOP) powder Apply 1 Application topically 3 (three) times daily as needed.    [provider]  nystatin powder Apply topically 3  times daily. 10/16/21   Angelica Pou, MD  omeprazole (PRILOSEC) 40 MG capsule Take 1 capsule by mouth in the morning and at bedtime. 09/19/21   Angelica Pou, MD  ondansetron (ZOFRAN) 4 MG tablet Take 1 tablet (4 mg total) by mouth every 8 (eight) hours as needed for nausea or vomiting. 09/19/21 09/19/22  Angelica Pou, MD  OXYGEN Inhale 2 L into the lungs at bedtime.    [provider]  potassium chloride SA (KLOR-CON M) 20 MEQ tablet Take 2 tablets (40 mEq total) by mouth daily. 08/23/21   [name], MD  rosuvastatin (CRESTOR) 20 MG tablet TAKE 1 TABLET BY MOUTH ONCE EVERY NIGHT AT BEDTIME 05/14/21   Angelica Pou, MD  Semaglutide,0.25 or 0.5MG/DOS, (OZEMPIC, 0.25 OR 0.5 MG/DOSE,) 2 MG/3ML SOPN Inject 0.5 mg into the skin once a week. 06/18/21   Angelica Pou, MD  senna-docusate (SENOKOT S) 8.6-50 MG tablet Take 1 to 2 tablets by mouth once or twice a day as needed for constipation 07/04/21   Angelica Pou, MD  warfarin (COUMADIN) 2.5 MG tablet Take 1 tablet by mouth daily unless otherwise instructed. Patient taking differently: Take 2.5 mg by mouth daily. Take 1 tablet by mouth daily unless otherwise instructed. 02/05/21   Angelica Pou, MD     Allergies    Dexilant [dexlansoprazole], Lorazepam, Oxycontin [oxycodone], and Tramadol hcl   Review of Systems   Review of Systems Please see HPI for pertinent positives and negatives  Physical Exam BP (!) 167/46   Pulse 70   Temp 97.9 F (36.6 C) (Oral)   Resp 20   Ht _0  (1.575 m)   Wt 74.4 kg   SpO2 98%   BMI 30.00 kg/m   Physical Exam Vitals and nursing note reviewed.  Constitutional:       Appearance: Normal appearance.  HENT:     Head: Normocephalic and atraumatic.     Nose: Nose normal.     Mouth/Throat:     Mouth: Mucous membranes are moist.  Eyes:     Extraocular Movements: Extraocular movements intact.     Conjunctiva/sclera: Conjunctivae normal.  Neck:     Comments: In Collar Cardiovascular:     Rate and Rhythm: Normal rate.  Pulmonary:     Effort: Pulmonary effort is normal.     Breath sounds: Wheezing present.  Abdominal:     General: Abdomen is flat.     Palpations: Abdomen is soft.     Tenderness: There is no abdominal tenderness.  Musculoskeletal:        General:  No swelling, tenderness or deformity. Normal range of motion.  Skin:    General: Skin is warm and dry.  Neurological:     General: No focal deficit present.     Mental Status: She is alert.  Psychiatric:        Mood and Affect: Mood normal.     ED Results / Procedures / Treatments   EKG None  Procedures Procedures  Medications Ordered in the ED Medications - No data to display  Initial Impression and Plan  Patient on coumadin with mechanical fall out of a chair, low risk impact. Will check CT, plan discharge if no acute injuries.   ED Course   Clinical Course as of 10/30/21 0734  Wed Oct 30, 2021  0731 Care of the patient signed out to Dr. Zenia Resides at the change of shift pending imaging and disposition.  [CS]    Clinical Course User Index [CS] Truddie Hidden, MD     MDM Rules/Calculators/A&P Medical Decision Making Problems Addressed: Fall, initial encounter: acute illness or injury Injury of head, initial encounter: acute illness or injury  Amount and/or Complexity of Data Reviewed Radiology: ordered.    Final Clinical Impression(s) / ED Diagnoses Final diagnoses:  Fall, initial encounter  Injury of head, initial encounter    Rx / DC Orders ED Discharge Orders     None        Truddie Hidden, MD 10/30/21 [phone]

## 2021-10-30 NOTE — ED Triage Notes (Addendum)
BIB EMS for a fall while asleep on chair, hit her head on cat food bag, a dresser was noted bedside the cat food bag. -LOC. CC of head pain, no obvious injury pt is on Coumadin. Wheezing en route  87m Albuterol

## 2021-10-31 ENCOUNTER — Telehealth: Payer: Self-pay

## 2021-10-31 NOTE — Patient Outreach (Signed)
  Care Coordination TOC Note Transition Care Management Unsuccessful Follow-up Telephone Call  Date of discharge and from where:  [facility] 10/30/21  Attempts:  1st Attempt  Reason for unsuccessful TCM follow-up call:  Unable to leave message- Unidentified voicemail.  Johnney Killian, RN, BSN, CCM Care Management Coordinator [facility] Phone: [phone]: [phone]

## 2021-11-01 ENCOUNTER — Telehealth: Payer: Self-pay

## 2021-11-01 DIAGNOSIS — G4733 Obstructive sleep apnea (adult) (pediatric): Secondary | ICD-10-CM | POA: Diagnosis not present

## 2021-11-01 NOTE — Patient Outreach (Signed)
  Care Coordination [facility] Note Transition Care Management Unsuccessful Follow-up Telephone Call  Date of discharge and from where:  [facility] 10/30/21  Attempts:  2nd Attempt  Reason for unsuccessful TCM follow-up call:  Unable to leave message  Johnney Killian, RN, BSN, CCM Care Management Coordinator [facility] Phone: [phone]: [phone]

## 2021-11-04 ENCOUNTER — Other Ambulatory Visit (HOSPITAL_COMMUNITY): Payer: Self-pay

## 2021-11-04 ENCOUNTER — Telehealth: Payer: Self-pay | Admitting: Pharmacist

## 2021-11-04 DIAGNOSIS — Z7901 Long term (current) use of anticoagulants: Secondary | ICD-10-CM | POA: Diagnosis not present

## 2021-11-04 DIAGNOSIS — I480 Paroxysmal atrial fibrillation: Secondary | ICD-10-CM | POA: Diagnosis not present

## 2021-11-04 NOTE — Telephone Encounter (Signed)
Denies bleeding of any kind or type, I.e. no blood in urine, stool, nose-bleeds, throwing up blood, coughing up blood, etc. No new medications. Adequate supply of warfarin on-hand she states.  Patient reports value of INR by patient-self-testing, finger-stick, point-of-care device just now:  2.20 (target range 1.5 - 2.5). Takes 1x2.62m tablet on We/Fri/Sun; 1/2 x 2.551m[1.2577mon Tu/Th/Sa; and 0mg10m Mondays. Will CONTINUE this regimen.

## 2021-11-05 ENCOUNTER — Other Ambulatory Visit (HOSPITAL_COMMUNITY): Payer: Self-pay

## 2021-11-05 IMAGING — CR DG HAND COMPLETE 3+V*L*
3 series · 3 of 3 positions shown · non-contrast
Comparison: None.

CLINICAL DATA: Bilateral hand pain.

EXAM:
LEFT HAND - COMPLETE 3+ VIEW

[hand pa]
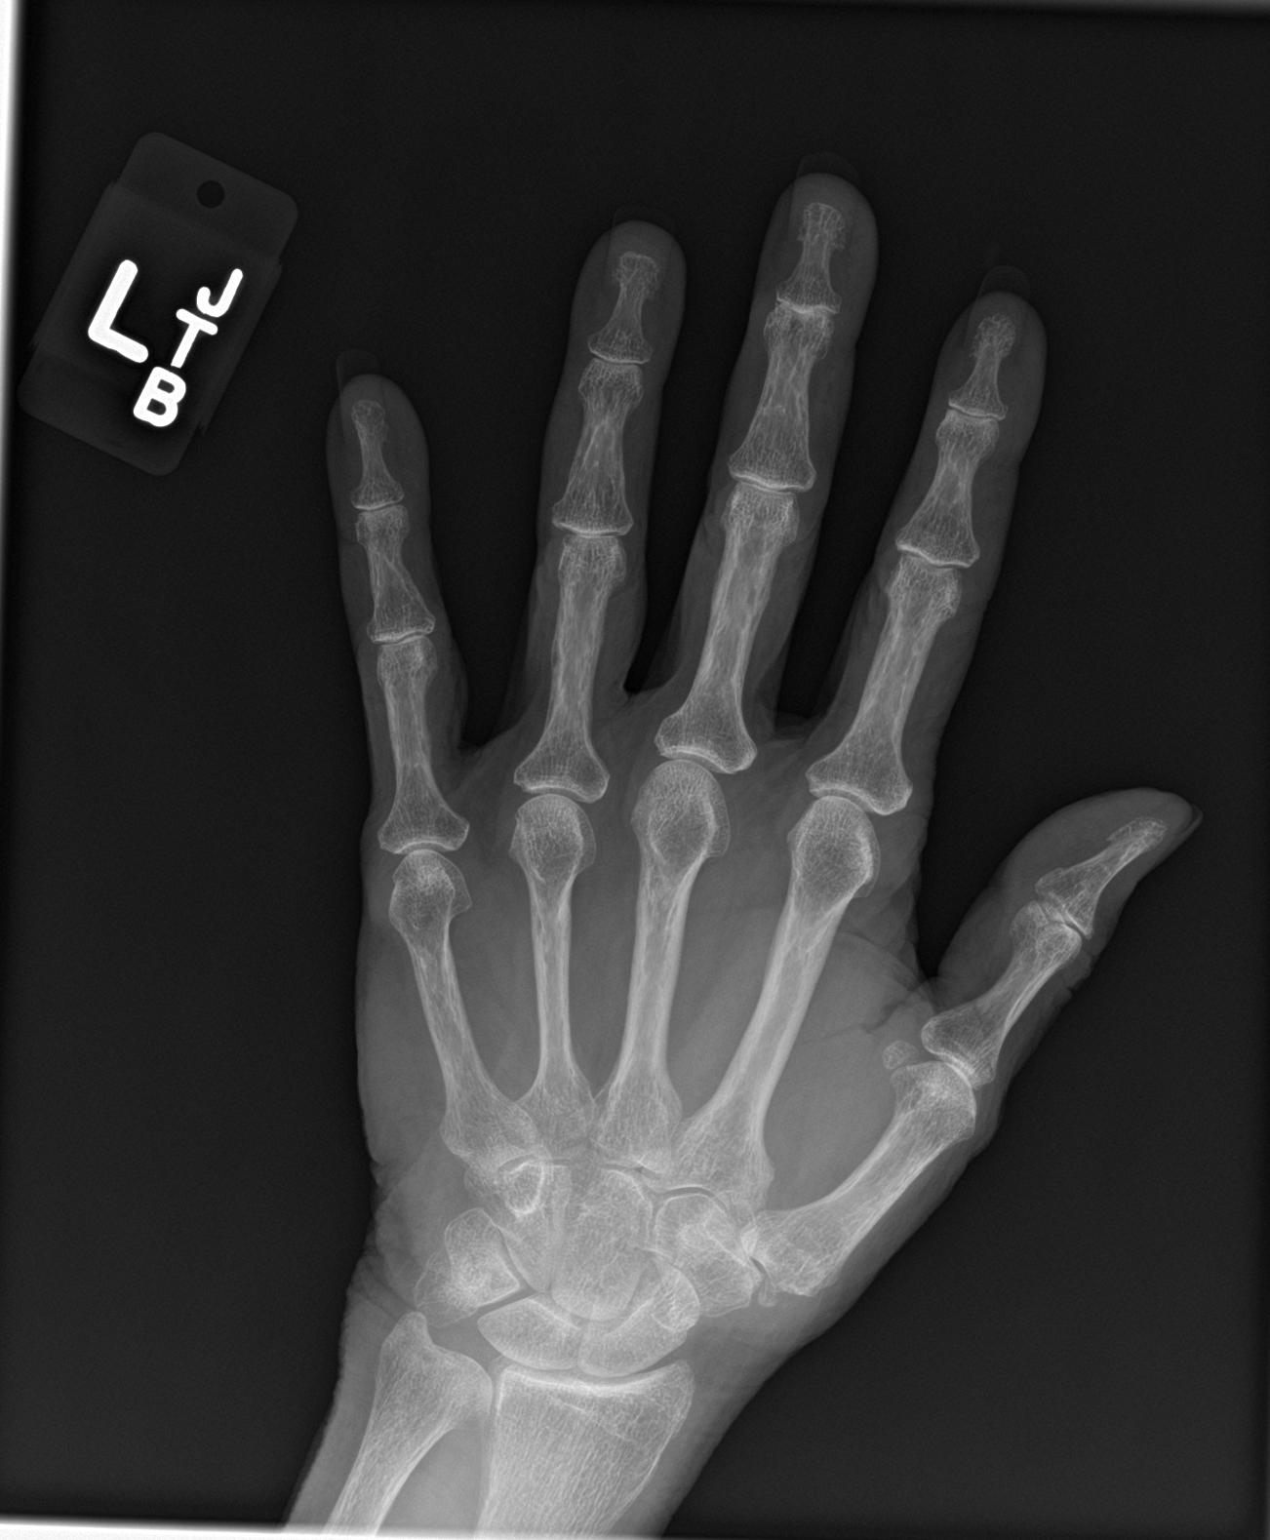

[hand obl]
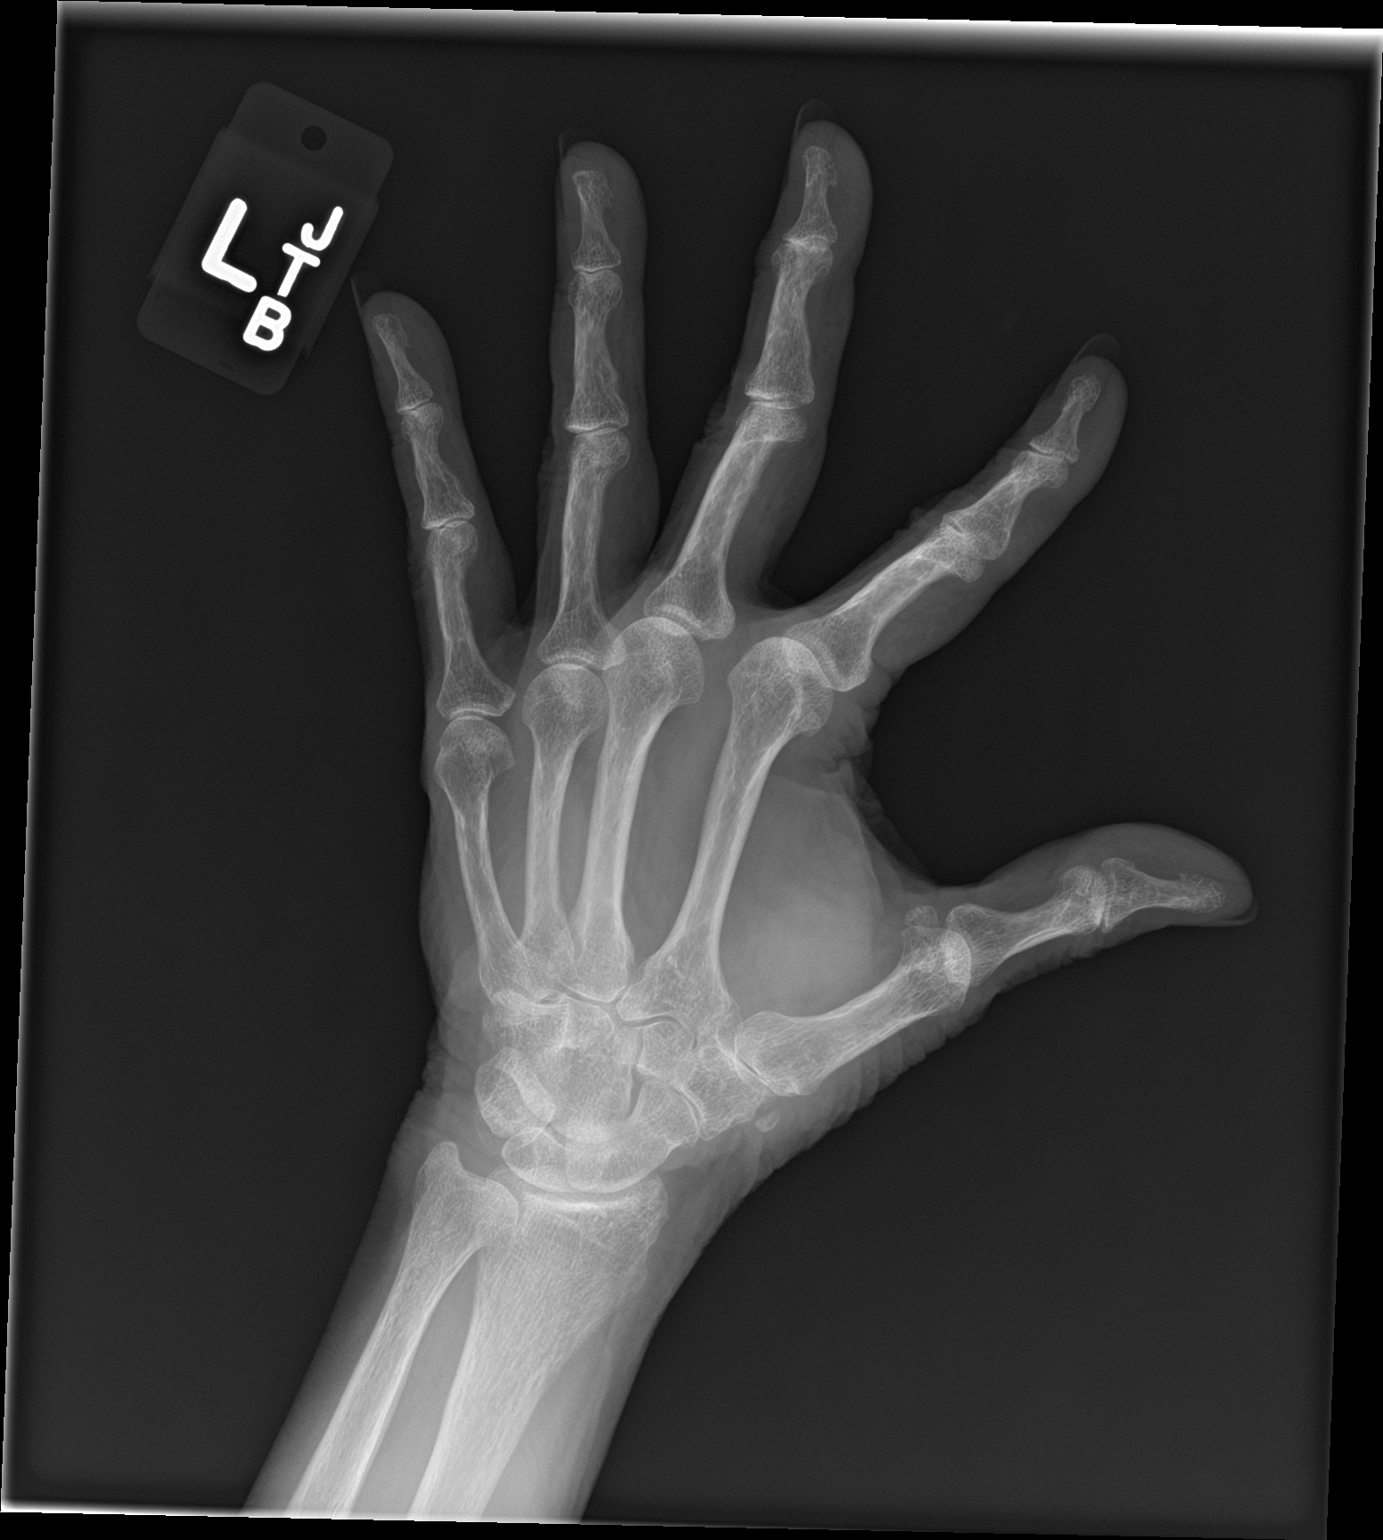

[hand lat]
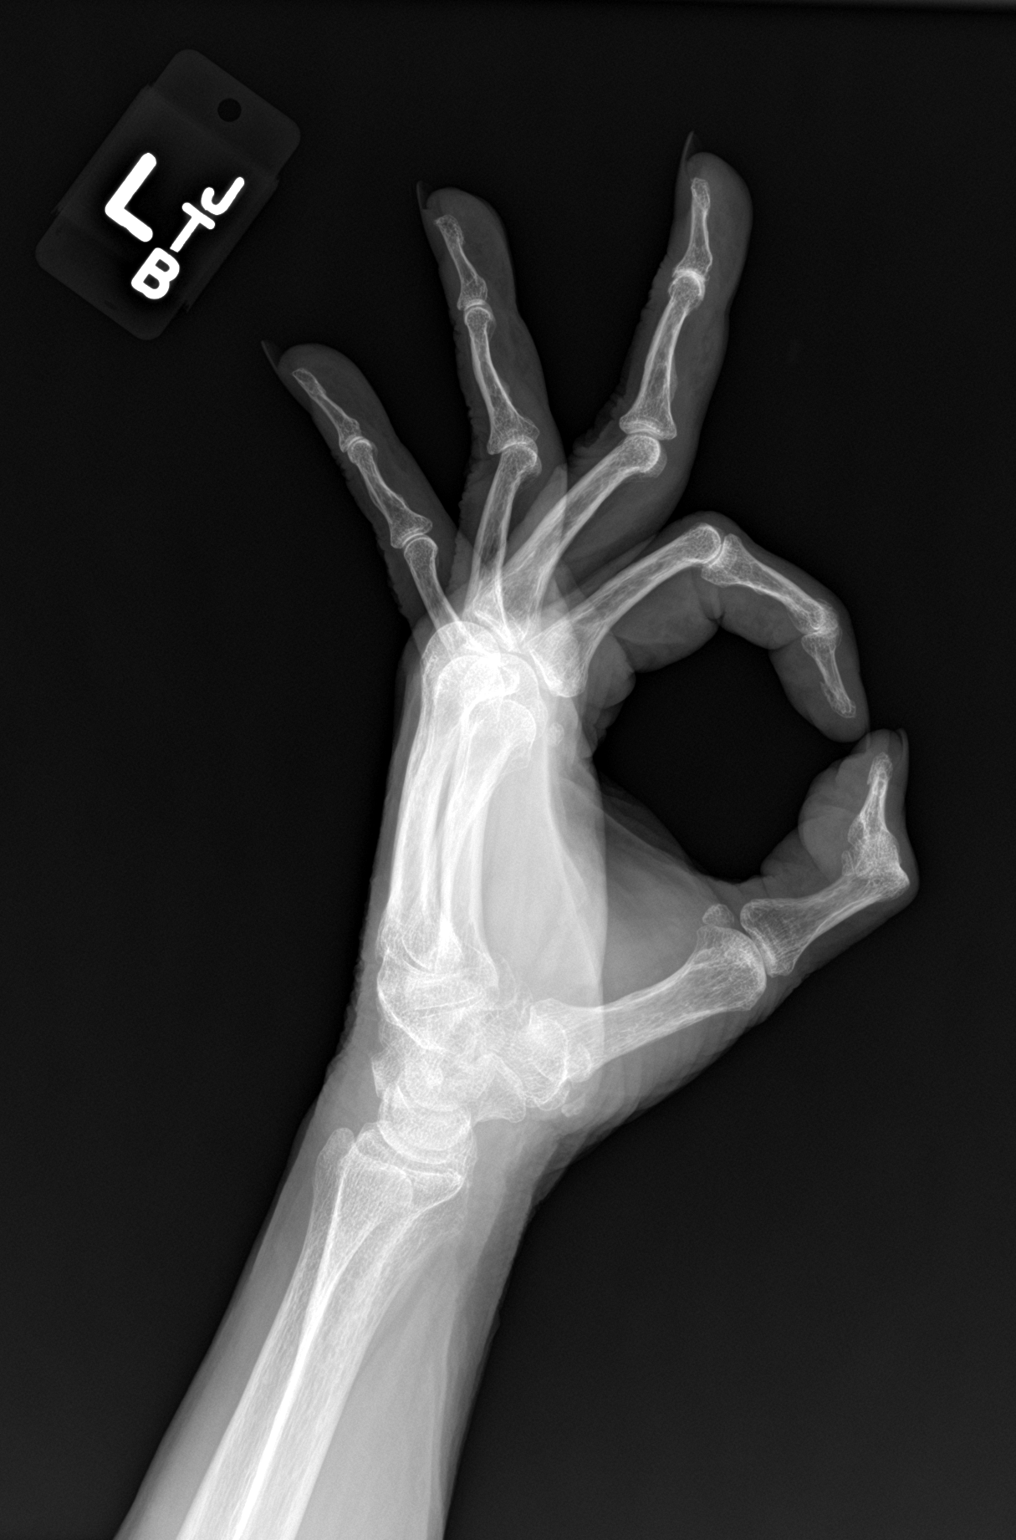

[3 of 3 positions shown; findings below may reference images not displayed]

FINDINGS: There is no evidence of fracture or dislocation. Mild degenerative
changes are seen along the carpometacarpal articulation of the left
thumb. Mild diffuse interphalangeal joint space narrowing is seen.
Soft tissues are unremarkable.
IMPRESSION: Mild degenerative changes, as described above.

## 2021-11-05 IMAGING — CR DG HAND COMPLETE 3+V*R*
3 series · 3 of 3 positions shown · non-contrast
Comparison: None.

CLINICAL DATA: Bilateral hand pain.

EXAM:
RIGHT HAND - COMPLETE 3+ VIEW

[hand pa]
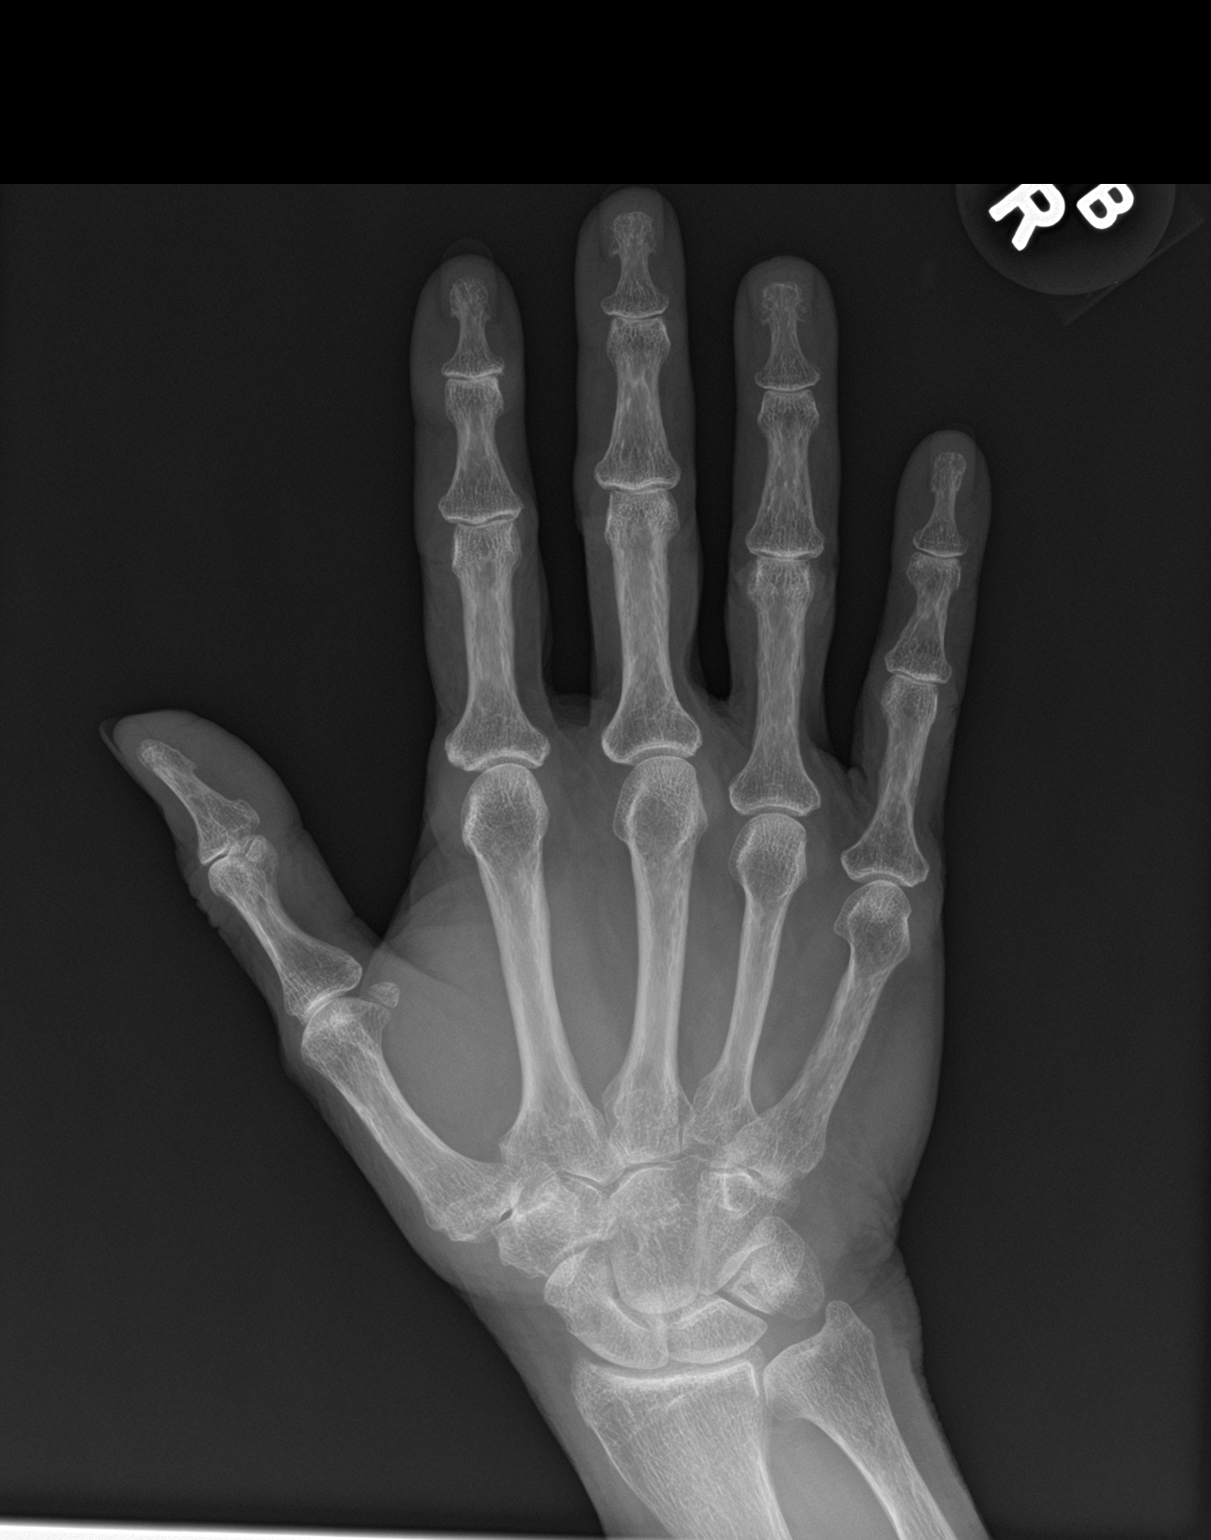

[hand obl]
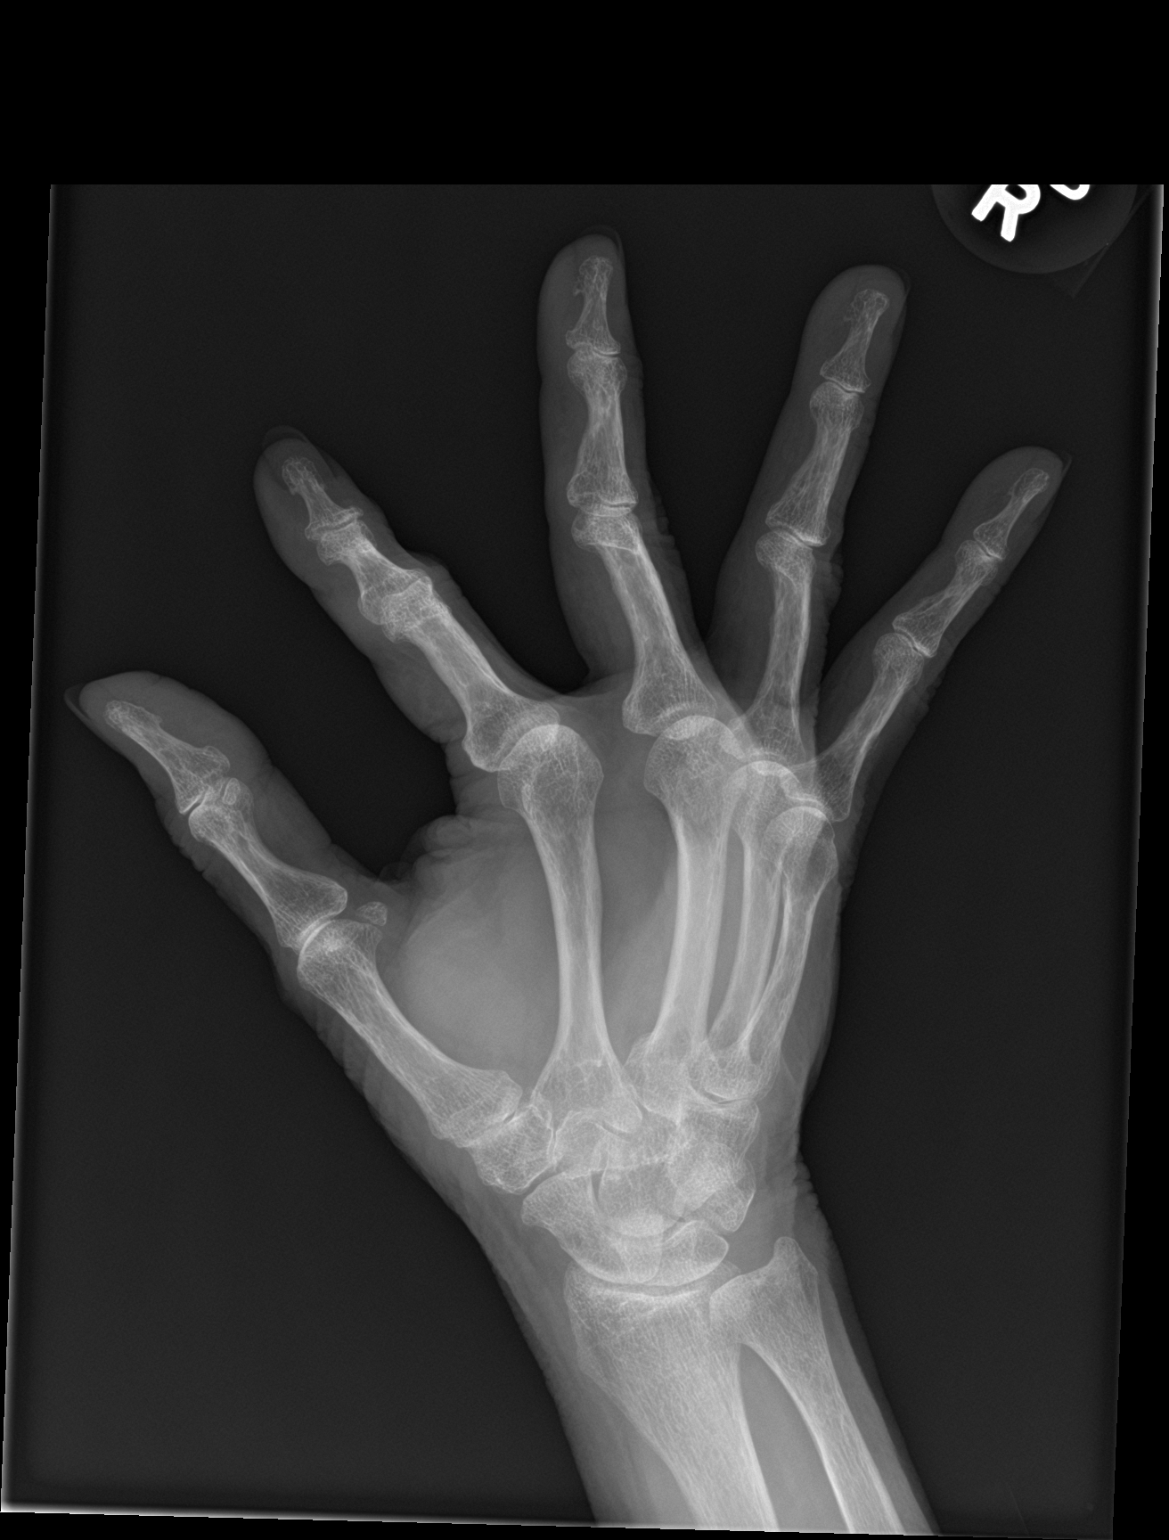

[hand lat]
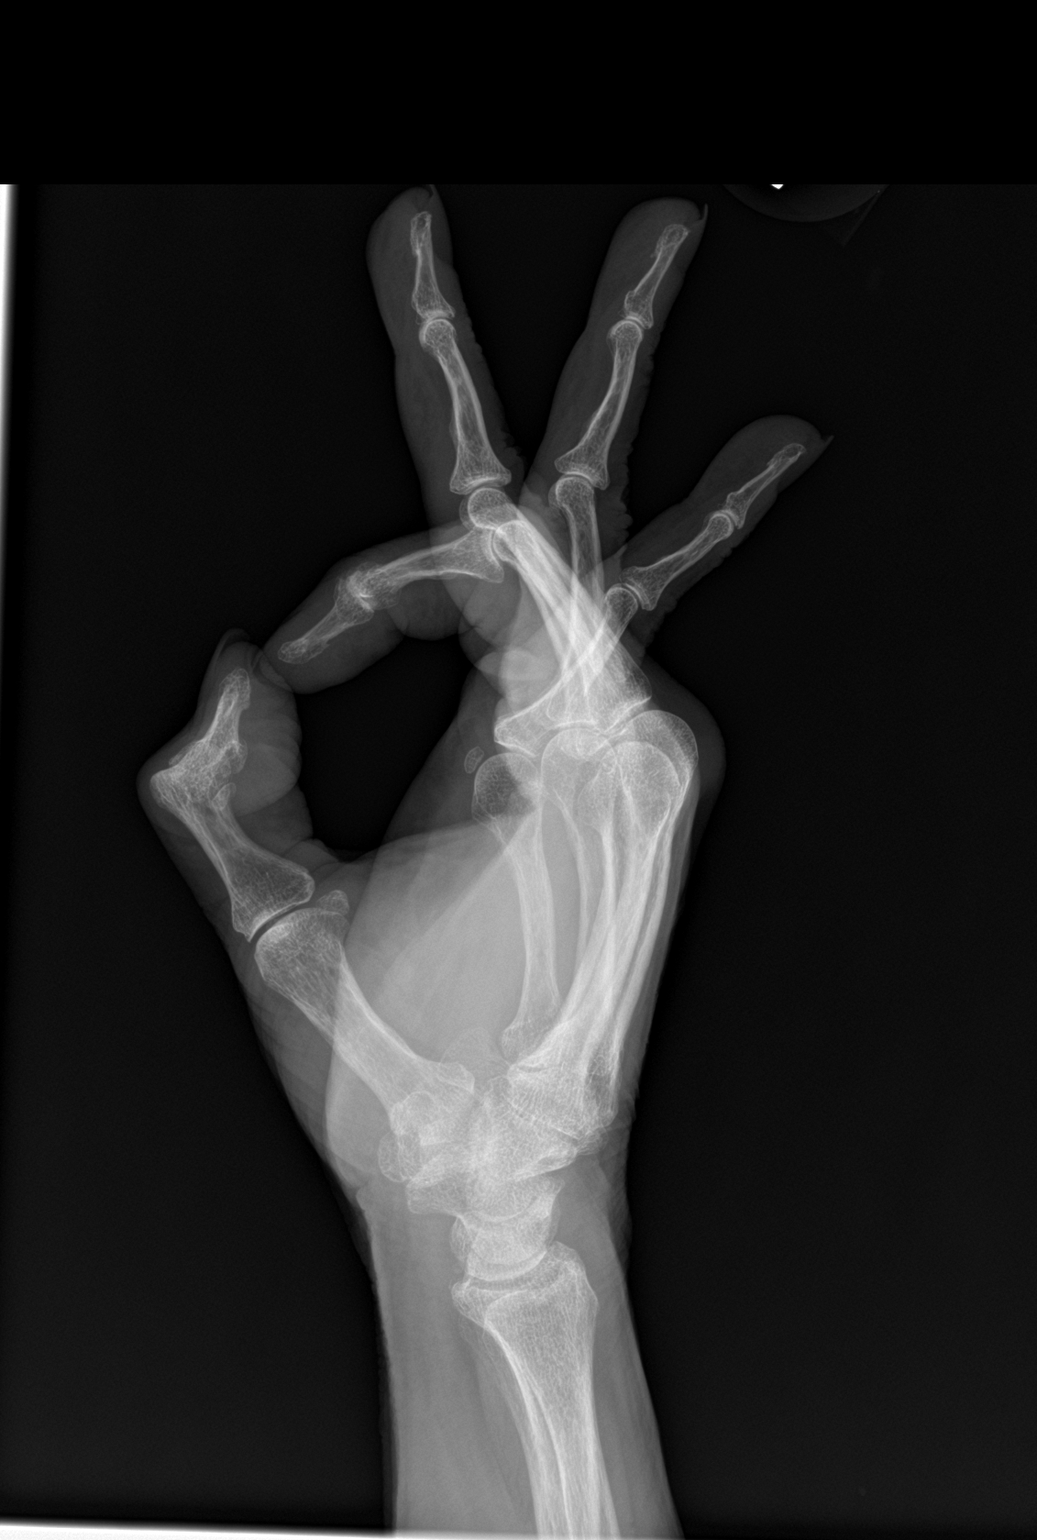

[3 of 3 positions shown; findings below may reference images not displayed]

FINDINGS: There is no evidence of fracture or dislocation. Mild degenerative
changes are seen along the carpometacarpal articulation of the left
thumb. Mild diffuse interphalangeal joint space narrowing is seen.
Soft tissues are unremarkable.
IMPRESSION: Mild degenerative changes, as described above.

## 2021-11-06 ENCOUNTER — Telehealth: Payer: Self-pay | Admitting: Student

## 2021-11-06 NOTE — Telephone Encounter (Signed)
Received page in the middle of the night to call back patient. I called [name] and verify her date of birth. She informed me that she had just fallen off of her bed she does not think she needs to come to the ER but wanted to speak to a provider. She took half of a Xanax at bedtime and put on her CPAP.  She thinks she is slipped out of her bed and found herself on her knees falling down.   She was able to brace herself with her arm.  She feels okay and does not feel like she completely hit her head on the ground. She does not endorse any dizziness, lightheadedness or headache.  She has not noticed any bruising or bleeding.  She was able to get to her reclining chair.  She has a history of sleepwalking in the past but unsure if she is still doing this. Of note, she was recently evaluated in the ER last week after a mechanical fall.  Discussed with patient that she will need to be evaluated in clinic after having multiple falls in the last week.  She informed me that she has an appointment with her PCP Dr. [name] tomorrow.  I informed patient that it is likely unsafe for her to live alone at this point in the setting of her mobility issues, multiple falls and warfarin therapy.  Patient states she agrees and will discuss this with Dr. [name] at the office visit.  Patient advised to call 911 or the clinic if she started having dizziness, headaches or any signs of bleeding.  Patient agreeable to plan.

## 2021-11-07 ENCOUNTER — Ambulatory Visit (INDEPENDENT_AMBULATORY_CARE_PROVIDER_SITE_OTHER): Payer: HMO | Admitting: Internal Medicine

## 2021-11-07 VITALS — BP 160/38 | HR 52 | Temp 97.8°F | Ht 62.0 in | Wt 163.5 lb

## 2021-11-07 DIAGNOSIS — E1140 Type 2 diabetes mellitus with diabetic neuropathy, unspecified: Secondary | ICD-10-CM | POA: Diagnosis not present

## 2021-11-07 DIAGNOSIS — E1151 Type 2 diabetes mellitus with diabetic peripheral angiopathy without gangrene: Secondary | ICD-10-CM

## 2021-11-07 DIAGNOSIS — Z23 Encounter for immunization: Secondary | ICD-10-CM | POA: Diagnosis not present

## 2021-11-07 DIAGNOSIS — E1142 Type 2 diabetes mellitus with diabetic polyneuropathy: Secondary | ICD-10-CM | POA: Diagnosis not present

## 2021-11-07 DIAGNOSIS — D509 Iron deficiency anemia, unspecified: Secondary | ICD-10-CM | POA: Diagnosis not present

## 2021-11-07 DIAGNOSIS — I5032 Chronic diastolic (congestive) heart failure: Secondary | ICD-10-CM

## 2021-11-07 DIAGNOSIS — I480 Paroxysmal atrial fibrillation: Secondary | ICD-10-CM | POA: Diagnosis not present

## 2021-11-07 DIAGNOSIS — Z794 Long term (current) use of insulin: Secondary | ICD-10-CM

## 2021-11-07 DIAGNOSIS — I10 Essential (primary) hypertension: Secondary | ICD-10-CM

## 2021-11-07 DIAGNOSIS — G4734 Idiopathic sleep related nonobstructive alveolar hypoventilation: Secondary | ICD-10-CM

## 2021-11-07 DIAGNOSIS — R296 Repeated falls: Secondary | ICD-10-CM | POA: Diagnosis not present

## 2021-11-07 DIAGNOSIS — E118 Type 2 diabetes mellitus with unspecified complications: Secondary | ICD-10-CM

## 2021-11-07 DIAGNOSIS — E1122 Type 2 diabetes mellitus with diabetic chronic kidney disease: Secondary | ICD-10-CM

## 2021-11-07 DIAGNOSIS — I739 Peripheral vascular disease, unspecified: Secondary | ICD-10-CM

## 2021-11-07 DIAGNOSIS — F17210 Nicotine dependence, cigarettes, uncomplicated: Secondary | ICD-10-CM

## 2021-11-07 DIAGNOSIS — N183 Chronic kidney disease, stage 3 unspecified: Secondary | ICD-10-CM | POA: Diagnosis not present

## 2021-11-07 DIAGNOSIS — I130 Hypertensive heart and chronic kidney disease with heart failure and stage 1 through stage 4 chronic kidney disease, or unspecified chronic kidney disease: Secondary | ICD-10-CM

## 2021-11-07 LAB — POCT GLYCOSYLATED HEMOGLOBIN (HGB A1C): Hemoglobin A1C: 6.6 % — AB (ref 4.0–5.6)

## 2021-11-07 LAB — GLUCOSE, CAPILLARY: Glucose-Capillary: 179 mg/dL — ABNORMAL HIGH (ref 70–99)

## 2021-11-07 NOTE — Progress Notes (Signed)
Fe studies Urine BM{

## 2021-11-07 NOTE — Progress Notes (Unsigned)
Routine f/u visit for 71 yo [name] for management of DM2, PAD, HTN, COPD with ongoing tobacco use, general decline in functional ability.    Has unfortunately experienced recent falls, 3 over 2 days.  The first one was at night, hadn't gone to bed yet, had taken 1/2 pill Xanax, -  fell forward out of chair, awakened when she hit her head on a bag of cat food on the floor.  She called EMS as precaution as she is on coumadin.  Evaluated in ED.   The next night she fell off the bed (high bed), minor discomfort, hit her head (no change in LOC) and bumped into her [facility]; called resident on call (very pleasant reassuring call).  Went back to bed- fell asleep, awakened beside the bed, stuck between the bed and the chair and felt trapped until she realized where she was, used her cell for the light and turned on TV for light.  Had difficulty getting up-  very weak legs ,and eventually sat in chair.  No significant injury sustained.  Self reports very active sleep, "I beat up the bed" , twists the bed covers, throws blankets off; hx of sleep walking during adolescence and again in her young adult years.  Seems to remember being told she has REM sleep disorder or movement disorder of sleep.    On her own (she is working on reducing medication dosing/burden) she cut back gabapentin and it did nothing to improve her daytime sedation and pain worsened somewhat. Still having sedation in the daytime.  Tried to decrease oxy to bid, pain worse.  Went up on pain medicine (still taking as prescribed) during the time she backed off gabapentin - she now takes the oxy  first thing in the morning and at two additional times a day.  "Not taking as much Xanax - it depends on how I feel - I'll take either a 1/2 or a whole pill at night (1.0 mg tab).  Takes a quarter pill during day for anxiety, shaking.  Everything makes her anxious.  She has been on centrally acting medicines for years.  Now has CPAP machine - sleeping an hour or  two more each sleep cycle.  First time she used it, she awakaned with no pain in the morning and was very pleased, but after a couple nights the pain returned.    Congested occasional cough, particularly in the mornings.  No change in meds, she will continue to reduce as she's able.  BP (!) 160/38 (BP Location: Right Arm, Patient Position: Sitting, Cuff Size: Small)   Pulse (!) 52   Temp 97.8 F (36.6 C) (Oral)   Ht _0  (1.575 m)   Wt 163 lb 8 oz (74.2 kg)   SpO2 98%   BMI 29.90 kg/m  Baseline appearance, alert and conversant, intermittently tremulous (chronic).  Chronically mildly pale.  Lungs with scattered rhonchi, no rales.  Cough on request is congested, no sputum expectorated.  Neck veins flat.  Nml respiratory effort.  Heart regular, slow rate (asymptomatic).  LE's no edema.  Feet not as brightly rubrous as usual.  Baseline cool and cyanotic toes.  Baseline absent pedal pulses and baseline reduced sensation. No open wounds.   Assessment and plan:  Chronic iron deficiency anemia Iron studies today show severe deficiency despite oral supplementation.  She has intolerance (hypotension) with IV Fe.  Currently asymptomatic but severe nature of deficiency is likely impacting peripheral oxygenation.  We'll discuss.  Essential hypertension  Unusually elevated today despite recheck. Took medications this morning.  Given high fall risk, I won't make changes now given concern for overcorrection.    Chronic congestive heart failure with left ventricular diastolic dysfunction (HCC) Compensated; neck veins flat, no LE edema, lungs clear. Continue current regimen.  She has routine f/u with heart failure clinic.  PAF (paroxysmal atrial fibrillation) (HCC) Auscultates regular rhythm today.  Anticoagulated with no bleeding complications, coumadin is usually within therapeutic range as managed in Dr. Gladstone Pih anticoagulation clinic.  Peripheral arterial disease (HCC) No wounds.  No classic  symptoms of rest pain or claudication, though she has multifactorial LE pain.  Toes are chronically dusky and cool and feet are chronically pulseless.  Not on antiplatelet due to risk of bleeding in combination with coumadin goal INR 2.5-3.5.  Stage 3a chronic kidney disease (CKD) (HCC) eGFR 59 today, 57 six months ago.  Check UA/C.  Nocturnal hypoxia per sleep study 07/2019 Has received CPAP through [facility].  Helping her to sleep up to an hour or so longer in each sleep cycle (still awakens multiple times a night).   Diabetic peripheral neuropathy associated with type 2 diabetes mellitus ([place]) From 09/19/2021: "Feet are sensate to monofilament testing today.  She experiences burning lower leg and foot pain, worse at night.  On gabapentin which is currently causing unwanted daytime sedation; she will reduce daytime dosing to 300 mg in the morning."  Reduction in dose did not decrease her daytime sedation and did worsen neuropathy pain.  She has resumed former dosing.  Recurrent falls 3 recent falls have occurred while sleeping; one falling forward from the chair, two falling out of bed.  She describes very active movement during sleep which could suggest a movement disorder of sleep or REM sleep disorder, though no abnormal movement on her sleep study Spring 2023.  Discussed fall prevention, padding floor, furniture.  Falls are multifactorial with centrally acting medications and weakness associated with low activity level major contributors.    Type 2 diabetes mellitus with complications (HCC)  CGM Freestyle Libre 3 two week data reviewed.  78% in target, 21% high, 1 % low.  Excellent documentation of meals and carb counting.  A1C today 6.6.  No change in therapy.

## 2021-11-08 ENCOUNTER — Telehealth: Payer: Self-pay

## 2021-11-08 LAB — BMP8+ANION GAP
Anion Gap: 12 mmol/L (ref 10.0–18.0)
BUN/Creatinine Ratio: 15 (ref 12–28)
BUN: 15 mg/dL (ref 8–27)
CO2: 27 mmol/L (ref 20–29)
Calcium: 9.4 mg/dL (ref 8.7–10.3)
Chloride: 97 mmol/L (ref 96–106)
Creatinine, Ser: 1.01 mg/dL — ABNORMAL HIGH (ref 0.57–1.00)
Glucose: 128 mg/dL — ABNORMAL HIGH (ref 70–99)
Potassium: 5.2 mmol/L (ref 3.5–5.2)
Sodium: 136 mmol/L (ref 134–144)
eGFR: 59 mL/min/{1.73_m2} — ABNORMAL LOW (ref >59)

## 2021-11-08 LAB — IRON,TIBC AND FERRITIN PANEL
Ferritin: 8 ng/mL — ABNORMAL LOW (ref 15–150)
Iron Saturation: 5 % — CL (ref 15–55)
Iron: 23 ug/dL — ABNORMAL LOW (ref 27–139)
Total Iron Binding Capacity: 451 ug/dL — ABNORMAL HIGH (ref 250–450)
UIBC: 428 ug/dL — ABNORMAL HIGH (ref 118–369)

## 2021-11-08 LAB — MICROALBUMIN / CREATININE URINE RATIO
Creatinine, Urine: 12.5 mg/dL
Microalb/Creat Ratio: 37 mg/g creat — ABNORMAL HIGH (ref 0–29)
Microalbumin, Urine: 4.6 ug/mL

## 2021-11-08 NOTE — Telephone Encounter (Signed)
        Patient  visited [place] on 9/20     Telephone encounter attempt :  1st  A HIPAA compliant voice message was left requesting a return call.  Instructed patient to call back    [facility] [place] [facility]  [phone] [address], [place], [place] [address] Phone: [phone] Email: [email or web address]

## 2021-11-09 NOTE — Progress Notes (Unsigned)
HPI F Smoker for evaluation of Nocturnal Hypoxemia courtesy of Dr [name] with original question of OSA, complicated by  Aortic  Atherosclerosis, AVM GI tract, CHF, Mitral Valve Replacement,  Cardiac Amyloid, PAD, Venous Insufficiency, HTN, Migraine, COPD, Pulmonary Hypertension, GERD, DM2, Hx Renal Carcinoma, Depression, Tobacco abuse, Obesity,  NPSG at [facility] 01/18/03- AHI 8.6/ hr -Nocturnal polysomnography (06/2009): Moderate sleep apnea/ hypopnea syndrome , AHI 17.8 per hour with nonpositional hypopneas. CPAP titration to 12 CWP, AHI 2.4 per hour.Body weight 183 lb NPSG 07/22/19- AHI 0.0/ htr, desat to 83%, with mean 93% after adding O2 2L, body weight 170 lbs PFT 04/23/16- Mod severe obstr F/F 0.69, FEV1 1.05/ 48%, insig resp to BD, TLC 76%, DLCO 52%  ==================================================================   03/11/21- 70 yoF Smoker(1 ppd) for evaluation of Nocturnal Hypoxemia , COPD, Chronic Hypoxic Resp Failure, complicated by  Aortic  Atherosclerosis, AVM GI tract, CHF, Mitral Valve Replacement,  Cardiac Amyloid, PAD, Venous Insufficiency, Pulmonary Hypertension, GERD, DM2,  Renal Carcinoma, Depression, Tobacco abuse, Obesity, Anemia,  HTN, Migraine,  -Trelegy 100, Proair hfa, Neb Duoneb,  O2 2L -3 sleep- owns her concentrator and portable O2                Hosp 08/18/20- Iron def Anemia Scheduled for CT ablation 8/3- kidney CA recurrent Covid vax-4 Moderna Flu vax-had Continues to smoke a pack a day.  Accepts referral to the telephone smoking cessation pharmacy program. Still using old CPAP machine with supplemental oxygen.  Sleep study in 2021 showed AHI 0 so she did not qualify then to replace the machine but she would feel better if she can continue to use it.  Needs new study to see if we can demonstrate OSA.  Otherwise consider if she would qualify for BiPAP machine with diagnosis of chronic respiratory failure. Inhaled medications do seem to help some. She  had cryoablation of recurrent renal cell cancer and follows with nephrology.  11/11/21- 31 yoF Smoker(1 ppd) for evaluation of Nocturnal Hypoxemia , COPD, Chronic Hypoxic Resp Failure, complicated by  Aortic  Atherosclerosis, AVM GI tract, CHF, Mitral Valve Replacement,  Cardiac Amyloid, PAD, Venous Insufficiency, Pulmonary Hypertension, GERD, DM2,  Renal Carcinoma, Depression, Tobacco abuse, Obesity, Anemia,  HTN, Migraine,  NPSG 04/10/21-AHI 0 / hr, desaturation to 83%/ needed supplemental O2, body weight 164 lbs -Trelegy 100, Proair hfa, Neb Duoneb, CPAP auto 5-15/ [facility]               replaced 2023 Download compliance- 83%, AHI 0.4/ hr O2 2L -3 sleep- owns her concentrator and portable O2     Body weight today-       161 lbs      Scheduled for CT ablation 8/3- kidney CA recurrent                     Friend here Covid vax-4 Moderna Flu vax-had LOV 06/14/21- SG,NP ordered replacement CPAP based on chronic respiratory failure, OHS, Other Sleep Disordered Breathing She was able to get CPAP replaced. Having some problems with head gear. Download reviewed. Has Duoneb but needs replacement for old nebulizer machine ahead of winter time. Using rescue inhaler 2 nights/ week.  Seems just a little confused about her equipment and meds- reviewed.  CT chest/abd/pelvis-08/16/21- Lungs/Pleura: Upper lobe predominant emphysema. No acute airspace disease, effusion, or pneumothorax. Central airways are patent. IMPRESSION: 1. Minimally displaced posterior left eleventh rib fracture. 2. No acute intra-abdominal or intrapelvic trauma. 3. Cholelithiasis without cholecystitis. 4. Nonobstructing bilateral  renal calculi. 5. Post therapeutic changes from left renal cell carcinoma ablation. 6. Aortic Atherosclerosis (ICD10-I70.0) and Emphysema (ICD10-J43.9).  ROS-see HPI   + = positive Constitutional:    weight loss, night sweats, fevers, chills, fatigue, lassitude. HEENT:    headaches, difficulty swallowing,  tooth/dental problems, sore throat,       sneezing, itching, ear ache, nasal congestion, post nasal drip, snoring CV:    chest pain, orthopnea, PND, swelling in lower extremities, anasarca,                                   dizziness, palpitations Resp:   +shortness of breath with exertion or at rest.                productive cough,   non-productive cough, coughing up of blood.              change in color of mucus.  wheezing.   Skin:    rash or lesions. GI:  No-   heartburn, indigestion, abdominal pain, nausea, vomiting, diarrhea,                 change in bowel habits, loss of appetite GU: dysuria, change in color of urine, no urgency or frequency.   flank pain. MS:   joint pain, stiffness, decreased range of motion, back pain. Neuro-     nothing unusual Psych:  change in mood or affect.  depression or anxiety.   memory loss.  OBJ- Physical Exam      General- Alert, Oriented, Affect-appropriate, Distress- none acute, + wheelchair,  room air Skin- rash-none, lesions- none, excoriation- none Lymphadenopathy- none Head- atraumatic            Eyes- Gross vision intact, PERRLA, conjunctivae and secretions clear            Ears- Hearing, canals-normal            Nose- Clear, no-Septal dev, mucus, polyps, erosion, perforation             Throat- Mallampati IV , mucosa clear , drainage- none, tonsils- atrophic, + edentulous Neck- flexible , trachea midline, no stridor , thyroid nl, carotid no bruit Chest - symmetrical excursion , unlabored           Heart/CV- RRR , no murmur , no gallop  , no rub, nl s1 s2                           - JVD- none , edema- none, stasis changes- none, varices- none           Lung- clear to P&A/ diminished, wheeze- none, cough-none , dullness-none, rub- none           Chest wall-  Abd-  Br/ Gen/ Rectal- Not done, not indicated Extrem- cyanosis- none, clubbing, none, atrophy- none, strength- nl Neuro- grossly intact to observation

## 2021-11-10 DIAGNOSIS — G4733 Obstructive sleep apnea (adult) (pediatric): Secondary | ICD-10-CM | POA: Diagnosis not present

## 2021-11-11 ENCOUNTER — Other Ambulatory Visit: Payer: Self-pay | Admitting: Internal Medicine

## 2021-11-11 ENCOUNTER — Ambulatory Visit: Payer: HMO | Admitting: Podiatry

## 2021-11-11 ENCOUNTER — Encounter: Payer: Self-pay | Admitting: Internal Medicine

## 2021-11-11 ENCOUNTER — Ambulatory Visit (INDEPENDENT_AMBULATORY_CARE_PROVIDER_SITE_OTHER): Payer: HMO | Admitting: Internal Medicine

## 2021-11-11 ENCOUNTER — Telehealth: Payer: Self-pay | Admitting: Pharmacist

## 2021-11-11 ENCOUNTER — Other Ambulatory Visit (HOSPITAL_COMMUNITY): Payer: Self-pay

## 2021-11-11 DIAGNOSIS — J449 Chronic obstructive pulmonary disease, unspecified: Secondary | ICD-10-CM | POA: Diagnosis not present

## 2021-11-11 DIAGNOSIS — E1142 Type 2 diabetes mellitus with diabetic polyneuropathy: Secondary | ICD-10-CM | POA: Insufficient documentation

## 2021-11-11 DIAGNOSIS — G4734 Idiopathic sleep related nonobstructive alveolar hypoventilation: Secondary | ICD-10-CM | POA: Diagnosis not present

## 2021-11-11 DIAGNOSIS — R296 Repeated falls: Secondary | ICD-10-CM | POA: Insufficient documentation

## 2021-11-11 DIAGNOSIS — Z720 Tobacco use: Secondary | ICD-10-CM | POA: Diagnosis not present

## 2021-11-11 NOTE — Assessment & Plan Note (Signed)
3 recent falls have occurred while sleeping; one falling forward from the chair, two falling out of bed.  She describes very active movement during sleep which could suggest a movement disorder of sleep or REM sleep disorder, though no abnormal movement on her sleep study Spring 2023.  Discussed fall prevention, padding floor, furniture.  Falls are multifactorial with centrally acting medications and weakness associated with low activity level major contributors.

## 2021-11-11 NOTE — Assessment & Plan Note (Signed)
No wounds.  No classic symptoms of rest pain or claudication, though she has multifactorial LE pain.  Toes are chronically dusky and cool and feet are chronically pulseless.  Not on antiplatelet due to risk of bleeding in combination with coumadin goal INR 2.5-3.5.

## 2021-11-11 NOTE — Telephone Encounter (Signed)
Patient provides results of patient self testing, finger stick, point of care INR = 1.7 (target range 1.5 - 2.5) on 11.28m warfarin/week. Will continue same regimen. No bleeding reported. No new medications, no missed doses other than the Mondays dose--which is always omitted as part of her dosing regimen.

## 2021-11-11 NOTE — Assessment & Plan Note (Signed)
Compensated; neck veins flat, no LE edema, lungs clear. Continue current regimen.  She has routine f/u with heart failure clinic.

## 2021-11-11 NOTE — Assessment & Plan Note (Signed)
Unusually elevated today despite recheck. Took medications this morning.  Given high fall risk, I won't make changes now given concern for overcorrection.

## 2021-11-11 NOTE — Assessment & Plan Note (Signed)
From 09/19/2021: "Feet are sensate to monofilament testing today.  She experiences burning lower leg and foot pain, worse at night.  On gabapentin which is currently causing unwanted daytime sedation; she will reduce daytime dosing to 300 mg in the morning."  Reduction in dose did not decrease her daytime sedation and did worsen neuropathy pain.  She has resumed former dosing.

## 2021-11-11 NOTE — Assessment & Plan Note (Signed)
>>  ASSESSMENT AND PLAN FOR CHRONIC CHF (CONGESTIVE HEART FAILURE) (HCC) WRITTEN ON 11/11/2021  3:08 PM BY Miguel Aschoff, MD  Compensated; neck veins flat, no LE edema, lungs clear. Continue current regimen.  She has routine f/u with heart failure clinic.

## 2021-11-11 NOTE — Assessment & Plan Note (Signed)
  CGM Freestyle Libre 3 two week data reviewed.  78% in target, 21% high, 1 % low.  Excellent documentation of meals and carb counting.  A1C today 6.6.  No change in therapy.

## 2021-11-11 NOTE — Assessment & Plan Note (Signed)
Iron studies today show severe deficiency despite oral supplementation.  She has intolerance (hypotension) with IV Fe.  Currently asymptomatic but severe nature of deficiency is likely impacting peripheral oxygenation.  We'll discuss.

## 2021-11-11 NOTE — Assessment & Plan Note (Addendum)
eGFR 59 today, 57 six months ago.  Check UA/C. She is not on ACEI/ARB/GLT2i which should be considered, though she is already experiencing POLYPHARMACY

## 2021-11-11 NOTE — Assessment & Plan Note (Signed)
Auscultates regular rhythm today.  Anticoagulated with no bleeding complications, coumadin is usually within therapeutic range as managed in Dr. Gladstone Pih anticoagulation clinic.

## 2021-11-11 NOTE — Assessment & Plan Note (Signed)
Has received CPAP through [facility].  Helping her to sleep up to an hour or so longer in each sleep cycle (still awakens multiple times a night).

## 2021-11-11 NOTE — Patient Instructions (Signed)
DME [facility]- order compressor nebulizer machine, hoses and supplies   dx COPD mixed type  Please call if we can help

## 2021-11-12 ENCOUNTER — Encounter: Payer: Self-pay | Admitting: Internal Medicine

## 2021-11-12 NOTE — Assessment & Plan Note (Signed)
Sleeps better with CPAP- benefits Plan- we can order mask fitting if needed. Replace old compressor nebulizer.

## 2021-11-12 NOTE — Assessment & Plan Note (Signed)
Not trying to stop smoking- support available when ready to accept.

## 2021-11-12 NOTE — Assessment & Plan Note (Signed)
Sleeps better with CPAP

## 2021-11-13 ENCOUNTER — Encounter: Payer: Self-pay | Admitting: Podiatry

## 2021-11-14 ENCOUNTER — Telehealth: Payer: Self-pay | Admitting: Internal Medicine

## 2021-11-15 NOTE — Telephone Encounter (Signed)
Last office visit notes have been printed and faxed to [facility].   Will close encounter.

## 2021-11-18 ENCOUNTER — Ambulatory Visit: Payer: HMO | Admitting: Podiatry

## 2021-11-18 ENCOUNTER — Telehealth: Payer: Self-pay | Admitting: Pharmacist

## 2021-11-18 NOTE — Telephone Encounter (Signed)
Patient provided results of PST FS POC INR determination from today = 2.0 (goal 1.5 - 2.5) on 11.4m warfarin per WEEK. (2.513mSu/We/Fri; 1.2577mu/Th/Sa; 0mg72m Monday. Repeat INR 16-OCT-23.

## 2021-11-18 NOTE — Telephone Encounter (Signed)
INTERNAL MEDICINE TEACHING ATTENDING ADDENDUM   I agree with pharmacy recommendations as outlined in their note.   Tabias Swayze, MD  

## 2021-11-20 ENCOUNTER — Other Ambulatory Visit: Payer: Self-pay | Admitting: Interventional Radiology

## 2021-11-20 DIAGNOSIS — N2889 Other specified disorders of kidney and ureter: Secondary | ICD-10-CM

## 2021-11-22 ENCOUNTER — Encounter (HOSPITAL_COMMUNITY): Payer: HMO

## 2021-11-25 ENCOUNTER — Telehealth: Payer: Self-pay | Admitting: Pharmacist

## 2021-11-25 NOTE — Telephone Encounter (Signed)
Patient reports PST FS POC INR Result = 2.2 on 11.43m warfarin/week as: 027mon Mon; 2.76m17mn Wed/Fri/Sun; 1.276m80m Tue/Th/Sat. Patient has 2.76mg 50mength green warfarin tablets. No bleeding endorsed, no new medications. No missed doses.

## 2021-12-03 ENCOUNTER — Ambulatory Visit: Payer: HMO | Admitting: Podiatry

## 2021-12-03 DIAGNOSIS — M79675 Pain in left toe(s): Secondary | ICD-10-CM

## 2021-12-03 DIAGNOSIS — B351 Tinea unguium: Secondary | ICD-10-CM

## 2021-12-03 DIAGNOSIS — M79674 Pain in right toe(s): Secondary | ICD-10-CM | POA: Diagnosis not present

## 2021-12-03 NOTE — Progress Notes (Signed)
Chief Complaint  Patient presents with   foot care    Patient is here for routine foot care.    SUBJECTIVE Patient presents to office today complaining of elongated, thickened nails that cause pain while ambulating in shoes.  She is unable to trim her own nails.  Patient is on Coumadin anticoagulant for peripheral vascular disease being managed by Dorian Pod MD.  Patient states that she does have blockages in her bilateral lower extremities.  Patient is here for further evaluation and treatment.  Past Medical History:  Diagnosis Date   Abnormality of lung on CXR 02/02/2020   Nonspecific finding on CXR ordered by pulmonologist - c/w inflammation vs infection, f/u imaging suggested, Dr. [name] office has been in communication about recommended next steps.   Anxiety 07/24/2010   Aortic atherosclerosis ([place]) 10/19/2014   Seen on CT scan, currently asymptomatic   Arteriovenous malformation of gastrointestinal tract 08/08/2015   Non-bleeding when visualized on capsule endoscopy 06/30/2015    Arthritis    "lower back; hands" (02/19/2018)   Asthma    Asymptomatic cholelithiasis 09/25/2015   Seen on CT scan 08/2015   Carotid artery stenosis; s/p R endarterectomy    s/p right endarterectomy (06/2010) Carotid US (07/2010):  Left: Moderate-to-severe (60-79%) calcific and non-calcific plaque origin and proximal ICA and ECA    Chronic congestive heart failure with left ventricular diastolic dysfunction (HCC) 10/21/2010   Chronic constipation 02/03/2011   Chronic daily headache 01/16/2014   Chronic iron deficiency anemia    Chronic low back pain 10/06/2012   Chronic venous insufficiency 08/04/2012   Closed fracture of one rib of left side 08/23/2021   ED visit after a fall 08/16/21    COPD (chronic obstructive pulmonary disease) with emphysema ([place])    PFTs 2018: severe obstructive disease, insignif response to bronchiodilator, mild restriction parenchymal pattern, moderately severe diffusion  defect. 2014  FEV1 0.92 (40%), ratio 69, 27% increase in FEV1 with BD, TLC 91%, severe airtrapping, DLCO49% On chronic home O2. Pulmonary rehab referral 05/2012    Dyspnea    Fibromyalgia 08/29/2010   Gastroesophageal reflux disease    History of blood transfusion    "several times"  (02/19/2018)   History of clear cell renal cell carcinoma ([place]), in remission 07/21/2011   s/p cryoablation of left RCC in 09/2011 by [name]. Followed by [name]  [facility]) .     History of hiatal hernia    History of mitral valve replacement with bioprosthetic valve due to mitral stenosis 2012   s/p MVR with a 27-mm pericardial porcine valve (Medtronic Mosaic valve, serial #[id number] on 09/20/10, Dr. Prescott Gum)    History of obstructive sleep apnea, resolved 2013   resolved per sleep study 07/2019; no apnea, but did have desaturation.  CPAP no longer necessary.  Nocturnal polysomnography (06/2009): Moderate sleep apnea/ hypopnea syndrome , AHI 17.8 per hour with nonpositional hypopneas. CPAP titration to 12 CWP, AHI 2.4 per hour. On nocturnal CPAP via a small resMed Quattro full-face mask with heated humidifier.    History of pneumonia    "once"  (02/19/2018)   History of seborrheic keratosis 09/28/2015   Hyperlipidemia LDL goal < 100 11/20/2005   Internal and external hemorrhoids without complication 33/61/2244   Lesion of left native kidney 06/01/2020   Incidental finding on recent screening chest CT for lung ca ordered by Dr. Annamaria Boots pulmonology - he has ordered a f/u renal U/S.    Lichen sclerosus of female genitalia 01/12/2017  Migraine    "none in years" (02/19/2018)   Moderately severe major depression ([place]) 11/19/2005   Nocturnal hypoxia per sleep study 07/2019    Osteoporosis    DEXA 2016: T -2.7; DEXA (12/09/2011): L-spine T -3.7, left hip T -1.4 DEXA (12/2004): L-spine T -2.6, left hip -0.1    Paroxysmal atrial fibrillation ([place]) 10/22/2010   s/p Left atrial maze procedure for  paroxysmal atrial fibrillation on 09/20/2010 by Dr Prescott Gum.  Subsequent splenic infarct, decision was made to re-anticoagulate with coumadin, likely life-long as this is the most likely cause of the splenic infarct.    Personal history of colonic polyps 05/14/2011   Colonoscopy (05/2011): 4 mm adenomatous polyp excised endoscopically Colonoscopy (02/2002): Adenomatous polyp excised endoscopically    Personal history of renal cell carcinoma 09/12/2020   Pneumonia    Pulmonary hypertension due to chronic obstructive pulmonary disease ([place]) 04/25/2016   2014 TEE w PA peak pressure 46 mmHg, s/p MV replacement    Right nephrolithiasis, asymptomatic, incidental finding 09/06/2014   5 mm non-obstructing calculus seen on CT scan 09/05/2014    Right ventricular failure ([place]) 04/25/2016   Severe obesity (BMI 35.0-39.9) with comorbidity ([place]) 10/23/2011   Sleep apnea    Tobacco abuse 07/28/2012   Type 2 diabetes mellitus with diabetic neuropathy ([place])     OBJECTIVE General Patient is awake, alert, and oriented x 3 and in no acute distress. Derm Skin is dry and supple bilateral. Negative open lesions or macerations. Remaining integument unremarkable. Nails are tender, long, thickened and dystrophic with subungual debris, consistent with onychomycosis, 1-5 bilateral. No signs of infection noted.  Hyperkeratotic preulcerative callus lesions also noted to the tips of the toes bilateral.  There are also symptomatic hyperkeratotic calluses noted to the subfirst MTPJ of the bilateral feet. Vasc  DP and PT pedal pulses diminished bilaterally. Temperature gradient within normal limits.  Delayed capillary refill bilateral Neuro Epicritic and protective threshold sensation grossly intact bilaterally.  Musculoskeletal Exam No symptomatic pedal deformities noted bilateral. Muscular strength within normal limits.  ASSESSMENT 1. Onychodystrophic nails 1-5 bilateral with hyperkeratosis of nails.  2. Onychomycosis of nail  due to dermatophyte bilateral 3. Pain in foot bilateral 4.  Preulcerative callus lesions bilateral feet 5.  Peripheral vascular disease  PLAN OF CARE 1. Patient evaluated today.  2. Instructed to maintain good pedal hygiene and foot care.  3. Mechanical debridement of nails 1-5 bilaterally performed using a nail nipper. Filed with dremel without incident.  4.  Excisional debridement of the hyperkeratotic calluses was performed using a chisel blade without incident or bleeding  5.  Patient states that she has been wearing house slippers around the house and has not been going barefoot 6.  Return to clinic in 3 mos.    Edrick Kins, DPM [facility]  Dr. Edrick Kins, DPM    [address], [place] [address]                Office [phone]  Fax [phone]

## 2021-12-04 ENCOUNTER — Other Ambulatory Visit (HOSPITAL_COMMUNITY): Payer: Self-pay

## 2021-12-05 ENCOUNTER — Other Ambulatory Visit (HOSPITAL_COMMUNITY): Payer: Self-pay

## 2021-12-08 ENCOUNTER — Other Ambulatory Visit: Payer: Self-pay

## 2021-12-09 ENCOUNTER — Other Ambulatory Visit: Payer: Self-pay

## 2021-12-09 ENCOUNTER — Telehealth: Payer: Self-pay | Admitting: Dietician

## 2021-12-09 DIAGNOSIS — F419 Anxiety disorder, unspecified: Secondary | ICD-10-CM

## 2021-12-09 NOTE — Telephone Encounter (Signed)
Wants to put her INR in her Elenor [place] 3./told her that is fine as kong as she does not expect [place] to look at it. She said it would be for her.

## 2021-12-10 ENCOUNTER — Other Ambulatory Visit (HOSPITAL_COMMUNITY): Payer: Self-pay

## 2021-12-10 MED ORDER — ALPRAZOLAM 1 MG PO TABS
0.5000 mg | ORAL_TABLET | Freq: Every evening | ORAL | 0 refills | Status: DC | PRN
  Filled 2021-12-10: qty 45, 30d supply, fill #0

## 2021-12-11 ENCOUNTER — Telehealth: Payer: Self-pay | Admitting: Pharmacist

## 2021-12-11 NOTE — Telephone Encounter (Signed)
Was provided PST FS POC INR results of 2.0 (target range 1.5-2.5) on 11.52m warfarin/wk. Using 2.5574mstrenght tablets, she takes: Su-2.74m72mMon 0mg48mue 1.274mg80md 2.74mg; 1m1.274mg; 33m2.74mg; Sa26m274mg. Ad1md to continue this regimen. No new medications. No signs/symptoms of bleeding.

## 2021-12-11 NOTE — Telephone Encounter (Signed)
INTERNAL MEDICINE TEACHING ATTENDING ADDENDUM   I agree with pharmacy recommendations as outlined in their note.   Emanuela Runnion, MD  

## 2021-12-12 ENCOUNTER — Ambulatory Visit (INDEPENDENT_AMBULATORY_CARE_PROVIDER_SITE_OTHER): Payer: HMO | Admitting: Internal Medicine

## 2021-12-12 VITALS — BP 133/48 | HR 54 | Temp 97.8°F | Ht 62.0 in | Wt 163.3 lb

## 2021-12-12 DIAGNOSIS — I110 Hypertensive heart disease with heart failure: Secondary | ICD-10-CM | POA: Diagnosis not present

## 2021-12-12 DIAGNOSIS — D509 Iron deficiency anemia, unspecified: Secondary | ICD-10-CM

## 2021-12-12 DIAGNOSIS — J449 Chronic obstructive pulmonary disease, unspecified: Secondary | ICD-10-CM

## 2021-12-12 DIAGNOSIS — I2723 Pulmonary hypertension due to lung diseases and hypoxia: Secondary | ICD-10-CM

## 2021-12-12 DIAGNOSIS — I5032 Chronic diastolic (congestive) heart failure: Secondary | ICD-10-CM

## 2021-12-12 DIAGNOSIS — I739 Peripheral vascular disease, unspecified: Secondary | ICD-10-CM

## 2021-12-12 DIAGNOSIS — I10 Essential (primary) hypertension: Secondary | ICD-10-CM

## 2021-12-12 DIAGNOSIS — R413 Other amnesia: Secondary | ICD-10-CM

## 2021-12-12 DIAGNOSIS — F17210 Nicotine dependence, cigarettes, uncomplicated: Secondary | ICD-10-CM

## 2021-12-12 NOTE — Progress Notes (Signed)
Routine close f/u visit with [name] to review multiple chronic conditions.  Most limiting continues to be her chronic pain, for which nothing seems to provide relief, which is disheartening.  We discussed the close relationship between her poor sleep and her pain, as exemplified by the significant improvement in pain the morning after she first used the CPAP.    Discussed ongoing Fe deficiency unresponsive to oral supplementation.  No active bleeding source; this is a chronic problem for years.  She raised concerns about her cognitive and physical function - having difficulty with memory.  Refer to [facility] for conversation abot elgibility  BP (!) 133/48 (BP Location: Right Arm, Patient Position: Sitting, Cuff Size: Small)   Pulse (!) 54   Temp 97.8 F (36.6 C) (Oral)   Ht _0  (1.575 m)   Wt 163 lb 4.8 oz (74.1 kg)   SpO2 95%   BMI 29.87 kg/m  [name] always looks nice.  She appears tired and frustrated with her existence.  Voice is chronically hoarse due to smoking.  No respiratory difficulty.  Lungs with scattered rhonchi and rales in posterior bases.  No JVD.  Heart RRR. LE's with no edema; distal feet are less cyanotic than usual.  The are at baseline pulseless and cool.  Hands are tremulous with action; this is not new.    Assessment and plan: Essential hypertension Well controlled; no changes needed.    Chronic congestive heart failure with left ventricular diastolic dysfunction (HCC) Compensated. No changes needed.  Pulmonary hypertension due to chronic obstructive pulmonary disease (HCC) No JVD or LE edema at this time.  Peripheral arterial disease (HCC) Distal feet are less cyanotic than usual.  They are at baseline pulseless and cool. See last assessment entry.   Chronic iron deficiency anemia No active bleeding source, no melena.  Will refer to hematology for direction on appropriate FE replacement as she had an allergic reaction to IV preparation and is unable to  take adequate oral supplementation.  She has seen a hematologist in the past and is agreeable.  Memory difficulties Self-reported memory challenges; this has been very concerning for her and we will dedicate next visit to evaluation.  Assessment is difficult in the setting of polypharmacy/multiple centrally acting medications. While we can clarify which cognitive domains are being affected, we can't know whether there is an underlying neurodegenerative disease process without drastically reducing her medication burden.  She was hopeful that there might be a blood test available to aid in diagnosis.

## 2021-12-15 ENCOUNTER — Other Ambulatory Visit: Payer: Self-pay

## 2021-12-16 ENCOUNTER — Other Ambulatory Visit (HOSPITAL_COMMUNITY): Payer: Self-pay

## 2021-12-16 NOTE — Telephone Encounter (Signed)
Patient reports PST FS POC INR value for today of 2.0 (target range 1.5 - 2.5) on 11.25 mg/wk as: Su-2.31mM-0mgT-1.25mgW-2.5mgTh-1.25mgF-2.5mgSa-1.25mg [using 2.510mstrength green warfarin tablets].   No missed doses, no signs or symptoms of bleeding reported. Patient was advised to continue same regimen and repeat her patient self testing, finger-stick, point of care INR determination from home, next Monday--13-NOV-23.

## 2021-12-17 ENCOUNTER — Encounter (HOSPITAL_COMMUNITY): Payer: Self-pay

## 2021-12-17 ENCOUNTER — Ambulatory Visit (HOSPITAL_COMMUNITY)
Admission: RE | Admit: 2021-12-17 | Discharge: 2021-12-17 | Disposition: A | Discharge status: Home / Self Care | Payer: HMO | Source: Ambulatory Visit | Attending: Interventional Radiology | Admitting: Interventional Radiology

## 2021-12-17 ENCOUNTER — Other Ambulatory Visit (HOSPITAL_COMMUNITY): Payer: Self-pay

## 2021-12-17 DIAGNOSIS — N2889 Other specified disorders of kidney and ureter: Secondary | ICD-10-CM | POA: Diagnosis not present

## 2021-12-17 MED ORDER — SODIUM CHLORIDE (PF) 0.9 % IJ SOLN
INTRAMUSCULAR | Status: AC
  Filled 2021-12-17: qty 50

## 2021-12-17 MED ORDER — IOHEXOL 300 MG/ML  SOLN
100.0000 mL | Freq: Once | INTRAMUSCULAR | Status: AC | PRN
  Administered 2021-12-17: 100 mL via INTRAVENOUS

## 2021-12-22 ENCOUNTER — Other Ambulatory Visit: Payer: Self-pay

## 2021-12-22 ENCOUNTER — Other Ambulatory Visit (HOSPITAL_COMMUNITY): Payer: Self-pay

## 2021-12-22 ENCOUNTER — Other Ambulatory Visit: Payer: Self-pay | Admitting: Internal Medicine

## 2021-12-22 DIAGNOSIS — J439 Emphysema, unspecified: Secondary | ICD-10-CM

## 2021-12-23 ENCOUNTER — Other Ambulatory Visit: Payer: Self-pay

## 2021-12-23 ENCOUNTER — Other Ambulatory Visit (HOSPITAL_COMMUNITY): Payer: Self-pay

## 2021-12-23 ENCOUNTER — Telehealth: Payer: Self-pay | Admitting: *Deleted

## 2021-12-23 ENCOUNTER — Encounter: Payer: Self-pay | Admitting: Internal Medicine

## 2021-12-23 DIAGNOSIS — G3184 Mild cognitive impairment, so stated: Secondary | ICD-10-CM

## 2021-12-23 DIAGNOSIS — R110 Nausea: Secondary | ICD-10-CM

## 2021-12-23 DIAGNOSIS — R413 Other amnesia: Secondary | ICD-10-CM | POA: Insufficient documentation

## 2021-12-23 DIAGNOSIS — I480 Paroxysmal atrial fibrillation: Secondary | ICD-10-CM

## 2021-12-23 DIAGNOSIS — F321 Major depressive disorder, single episode, moderate: Secondary | ICD-10-CM

## 2021-12-23 DIAGNOSIS — E1140 Type 2 diabetes mellitus with diabetic neuropathy, unspecified: Secondary | ICD-10-CM

## 2021-12-23 HISTORY — DX: Mild cognitive impairment of uncertain or unknown etiology: G31.84

## 2021-12-23 MED ORDER — BUPROPION HCL ER (XL) 150 MG PO TB24
150.0000 mg | ORAL_TABLET | ORAL | 3 refills | Status: DC
  Filled 2021-12-23 – 2022-01-24 (×2): qty 90, 90d supply, fill #0
  Filled 2022-05-18: qty 90, 90d supply, fill #1
  Filled 2022-08-12: qty 90, 90d supply, fill #2
  Filled 2022-12-23: qty 90, 90d supply, fill #3

## 2021-12-23 MED ORDER — FREESTYLE LIBRE 3 SENSOR MISC
1.0000 | Freq: Every day | 3 refills | Status: DC
  Filled 2021-12-23 – 2022-01-24 (×2): qty 6, 84d supply, fill #0
  Filled 2022-04-15: qty 6, 84d supply, fill #1
  Filled 2022-04-19 – 2022-07-14 (×2): qty 6, 84d supply, fill #2
  Filled 2022-09-29: qty 6, 84d supply, fill #3

## 2021-12-23 MED ORDER — ONDANSETRON HCL 4 MG PO TABS
4.0000 mg | ORAL_TABLET | Freq: Three times a day (TID) | ORAL | 0 refills | Status: DC | PRN
  Filled 2021-12-23: qty 21, 7d supply, fill #0

## 2021-12-23 MED ORDER — METOPROLOL SUCCINATE ER 50 MG PO TB24
50.0000 mg | ORAL_TABLET | Freq: Every day | ORAL | 3 refills | Status: DC
  Filled 2021-12-23 – 2022-01-24 (×2): qty 90, 90d supply, fill #0

## 2021-12-23 NOTE — Assessment & Plan Note (Signed)
Distal feet are less cyanotic than usual.  They are at baseline pulseless and cool. See last assessment entry.

## 2021-12-23 NOTE — Assessment & Plan Note (Signed)
>>  ASSESSMENT AND PLAN FOR CHRONIC CHF (CONGESTIVE HEART FAILURE) (HCC) WRITTEN ON 12/23/2021  4:27 PM BY Miguel Aschoff, MD  Compensated. No changes needed.

## 2021-12-23 NOTE — Assessment & Plan Note (Signed)
No JVD or LE edema at this time.

## 2021-12-23 NOTE — Assessment & Plan Note (Signed)
Well controlled; no changes needed.

## 2021-12-23 NOTE — Assessment & Plan Note (Signed)
Self-reported memory challenges; this has been very concerning for her and we will dedicate next visit to evaluation.  Assessment is difficult in the setting of polypharmacy/multiple centrally acting medications. While we can clarify which cognitive domains are being affected, we can't know whether there is an underlying neurodegenerative disease process without drastically reducing her medication burden.  She was hopeful that there might be a blood test available to aid in diagnosis.

## 2021-12-23 NOTE — Assessment & Plan Note (Signed)
No active bleeding source, no melena.  Will refer to hematology for direction on appropriate FE replacement as she had an allergic reaction to IV preparation and is unable to take adequate oral supplementation.  She has seen a hematologist in the past and is agreeable.

## 2021-12-23 NOTE — Telephone Encounter (Signed)
Patient reports PST FS POC INR results today of 1.9 (INR target range 1.5 - 2.5) on 11.4m using 2.524mstrength tablets as: Su-2.101m51m0mgT-1.2101mgW-2.101mgTh-1.2101mgF-.101mgSa-1.2101mg. Will CONTINUE same regimen. Repeat PST FS POC INR on Monday 20-NOV-23 . No bleeding reported. No new medications. Adequate supply of warfarin on-hand.

## 2021-12-23 NOTE — Assessment & Plan Note (Signed)
Compensated. No changes needed.

## 2021-12-23 NOTE — Telephone Encounter (Signed)
Received fax from [facility] with INR results from 08/19/21 to 12/23/21. Last INR 1.9 on 11/13. Results placed in Dr. Gladstone Pih office.

## 2021-12-24 ENCOUNTER — Telehealth: Payer: Self-pay | Admitting: Internal Medicine

## 2021-12-24 NOTE — Telephone Encounter (Signed)
I received a page for an after-hours phone call from [name]. She explains that yesterday, Monday, was a normal day for her and she took all of her medications and felt just fine. Today however she has been sleeping most of the day and has not gotten out of bed until just recently. She is worried about which medications she should take tonight and which she can wait to restart regular scheduling of tomorrow morning as many of her medications are taken in the morning.   I went through her medication list and discussed which medications could be restarted tomorrow morning and which she could take tonight before bed. Those that I recommend she take tonight are:  Cymbalta 60 mg Lasix 40 mg Gabapentin 600 mg Metoprolol 50 mg Warfarin 2.5 mg  I have advised that she call back with any questions.   Farrel Gordon, DO

## 2021-12-25 ENCOUNTER — Ambulatory Visit
Admission: RE | Admit: 2021-12-25 | Discharge: 2021-12-25 | Disposition: A | Discharge status: Home / Self Care | Payer: HMO | Source: Ambulatory Visit | Attending: Interventional Radiology | Admitting: Interventional Radiology

## 2021-12-25 ENCOUNTER — Encounter: Payer: Self-pay | Admitting: Lab

## 2021-12-25 DIAGNOSIS — N2889 Other specified disorders of kidney and ureter: Secondary | ICD-10-CM

## 2021-12-25 HISTORY — PX: IR RADIOLOGIST EVAL & MGMT: IMG5224

## 2021-12-25 NOTE — Progress Notes (Signed)
Chief Complaint: Patient was consulted remotely today (TeleHealth) for  follow up after left renal cryoablation.    History of Present Illness: [name] is a 71 y.o. female status post prior cryoablation of a left posterior lower pole clear cell renal carcinoma on 09/19/2011 which demonstrated no evidence of recurrence for 5 years after treatment.  She was recently referred for an enlarging upper pole lesion of the left kidney measuring approximately 1.4 cm and demonstrating partial central enhancement suspicious for a slow-growing renal carcinoma.  This was treated successfully with percutaneous cryoablation on 09/12/2020.    She has been doing well other than some recent falls at home. She was seen in the emergency department in September for one of these falls.  She denies any urinary symptoms or abdominal pain.  Past Medical History:  Diagnosis Date   Abnormality of lung on CXR 02/02/2020   Nonspecific finding on CXR ordered by pulmonologist - c/w inflammation vs infection, f/u imaging suggested, Dr. [name] office has been in communication about recommended next steps.   Anxiety 07/24/2010   Aortic atherosclerosis ([place]) 10/19/2014   Seen on CT scan, currently asymptomatic   Arteriovenous malformation of gastrointestinal tract 08/08/2015   Non-bleeding when visualized on capsule endoscopy 06/30/2015    Arthritis    "lower back; hands" (02/19/2018)   Asthma    Asymptomatic cholelithiasis 09/25/2015   Seen on CT scan 08/2015   Carotid artery stenosis; s/p R endarterectomy    s/p right endarterectomy (06/2010) Carotid US (07/2010):  Left: Moderate-to-severe (60-79%) calcific and non-calcific plaque origin and proximal ICA and ECA    Chronic congestive heart failure with left ventricular diastolic dysfunction (HCC) 10/21/2010   Chronic constipation 02/03/2011   Chronic daily headache 01/16/2014   Chronic iron deficiency anemia    Chronic low back pain 10/06/2012   Chronic  venous insufficiency 08/04/2012   Closed fracture of one rib of left side 08/23/2021   ED visit after a fall 08/16/21    COPD (chronic obstructive pulmonary disease) with emphysema ([place])    PFTs 2018: severe obstructive disease, insignif response to bronchiodilator, mild restriction parenchymal pattern, moderately severe diffusion defect. 2014  FEV1 0.92 (40%), ratio 69, 27% increase in FEV1 with BD, TLC 91%, severe airtrapping, DLCO49% On chronic home O2. Pulmonary rehab referral 05/2012    Dyspnea    Fibromyalgia 08/29/2010   Gastroesophageal reflux disease    History of blood transfusion    "several times"  (02/19/2018)   History of clear cell renal cell carcinoma ([place]), in remission 07/21/2011   s/p cryoablation of left RCC in 09/2011 by [name]. Followed by [name]  [facility]) .     History of hiatal hernia    History of mitral valve replacement with bioprosthetic valve due to mitral stenosis 2012   s/p MVR with a 27-mm pericardial porcine valve (Medtronic Mosaic valve, serial #[id number] on 09/20/10, Dr. Prescott Gum)    History of obstructive sleep apnea, resolved 2013   resolved per sleep study 07/2019; no apnea, but did have desaturation.  CPAP no longer necessary.  Nocturnal polysomnography (06/2009): Moderate sleep apnea/ hypopnea syndrome , AHI 17.8 per hour with nonpositional hypopneas. CPAP titration to 12 CWP, AHI 2.4 per hour. On nocturnal CPAP via a small resMed Quattro full-face mask with heated humidifier.    History of pneumonia    "once"  (02/19/2018)   History of seborrheic keratosis 09/28/2015   Hyperlipidemia LDL goal < 100 11/20/2005   Internal and  external hemorrhoids without complication 63/84/6659   Lesion of left native kidney 06/01/2020   Incidental finding on recent screening chest CT for lung ca ordered by Dr. Annamaria Boots pulmonology - he has ordered a f/u renal U/S.    Lichen sclerosus of female genitalia 01/12/2017   Migraine    "none in years" (02/19/2018)    Moderately severe major depression ([place]) 11/19/2005   Nocturnal hypoxia per sleep study 07/2019    Osteoporosis    DEXA 2016: T -2.7; DEXA (12/09/2011): L-spine T -3.7, left hip T -1.4 DEXA (12/2004): L-spine T -2.6, left hip -0.1    Paroxysmal atrial fibrillation ([place]) 10/22/2010   s/p Left atrial maze procedure for paroxysmal atrial fibrillation on 09/20/2010 by Dr Prescott Gum.  Subsequent splenic infarct, decision was made to re-anticoagulate with coumadin, likely life-long as this is the most likely cause of the splenic infarct.    Personal history of colonic polyps 05/14/2011   Colonoscopy (05/2011): 4 mm adenomatous polyp excised endoscopically Colonoscopy (02/2002): Adenomatous polyp excised endoscopically    Personal history of renal cell carcinoma 09/12/2020   Pneumonia    Pulmonary hypertension due to chronic obstructive pulmonary disease ([place]) 04/25/2016   2014 TEE w PA peak pressure 46 mmHg, s/p MV replacement    Right nephrolithiasis, asymptomatic, incidental finding 09/06/2014   5 mm non-obstructing calculus seen on CT scan 09/05/2014    Right ventricular failure ([place]) 04/25/2016   Severe obesity (BMI 35.0-39.9) with comorbidity ([place]) 10/23/2011   Sleep apnea    Tobacco abuse 07/28/2012   Type 2 diabetes mellitus with diabetic neuropathy [facility])     Past Surgical History:  Procedure Laterality Date   BIOPSY  07/19/2021   Procedure: BIOPSY;  Surgeon: Clarene Essex, MD;  Location: [facility];  Service: Gastroenterology;;   CARDIAC CATHETERIZATION     CARDIAC VALVE REPLACEMENT  Aug. 2012   "mitral valve"   CAROTID ENDARTERECTOMY Right 07/04/2010   by [name] for asymptomatic right carotid artery stenosis   CATARACT EXTRACTION W/ INTRAOCULAR LENS  IMPLANT, BILATERAL Bilateral    CHEST TUBE INSERTION  09/24/2010   Dr Lucianne Lei Trigt   COLONOSCOPY  05/12/2011   performed by Dr. Michail Sermon. Showing small internal hemorrhoids, single tubular adenoma polyp   COLONOSCOPY WITH PROPOFOL N/A  05/29/2020   Procedure: COLONOSCOPY WITH PROPOFOL;  Surgeon: Clarene Essex, MD;  [facility]: [facility];  Service: Endoscopy;  Laterality: N/A;   COLONOSCOPY WITH PROPOFOL N/A 07/19/2021   Procedure: COLONOSCOPY WITH PROPOFOL;  Surgeon: Clarene Essex, MD;  Location: [facility];  Service: Gastroenterology;  Laterality: N/A;   CRYOABLATION Left 09/2011   by [name]. Followed by [name]  [facility]) .     DILATION AND CURETTAGE OF UTERUS     ESOPHAGOGASTRODUODENOSCOPY  05/12/2011   performed by Dr. Michail Sermon. Negative for ulcerations, biopsy negative for evidence of celiac sprue   ESOPHAGOGASTRODUODENOSCOPY N/A 06/29/2015   Procedure: ESOPHAGOGASTRODUODENOSCOPY (EGD);  Surgeon: Clarene Essex, MD;  Location: [facility] ENDOSCOPY;  Service: Endoscopy;  Laterality: N/A;   ESOPHAGOGASTRODUODENOSCOPY N/A 03/29/2016   Procedure: ESOPHAGOGASTRODUODENOSCOPY (EGD);  Surgeon: Clarene Essex, MD;  Location: [facility] ENDOSCOPY;  Service: Endoscopy;  Laterality: N/A;   ESOPHAGOGASTRODUODENOSCOPY (EGD) WITH PROPOFOL N/A 05/29/2020   Procedure: ESOPHAGOGASTRODUODENOSCOPY (EGD) WITH PROPOFOL;  Surgeon: Clarene Essex, MD;  [facility];  Service: Endoscopy;  Laterality: N/A;   FRACTURE SURGERY     GIVENS CAPSULE STUDY N/A 06/30/2015   Procedure: GIVENS CAPSULE STUDY;  Surgeon: Clarene Essex, MD;  Location: [place];  Service:  Endoscopy;  Laterality: N/A;   GIVENS CAPSULE STUDY N/A 06/29/2015   Procedure: GIVENS CAPSULE STUDY;  Surgeon: Clarene Essex, MD;  Location: [place];  Service: Endoscopy;  Laterality: N/A;   HEMORRHOID SURGERY  1970s?   "lanced"   HYSTEROSCOPY W/ ENDOMETRIAL ABLATION  06/2001   for persistent post-menopausal bleeding // by S. [name], M.D.   IR GENERIC HISTORICAL  08/23/2015   IR RADIOLOGIST EVAL & MGMT 08/23/2015 Aletta Edouard, MD [facility] INTERV RAD   IR GENERIC HISTORICAL  04/09/2016   IR RADIOLOGIST EVAL & MGMT 04/09/2016 Aletta Edouard, MD [facility] INTERV RAD   IR RADIOLOGIST EVAL & MGMT   10/07/2016   IR RADIOLOGIST EVAL & MGMT  06/25/2017   IR RADIOLOGIST EVAL & MGMT  07/31/2020   IR RADIOLOGIST EVAL & MGMT  10/02/2020   IR RADIOLOGIST EVAL & MGMT  12/20/2020   LEFT HEART CATH AND CORONARY ANGIOGRAPHY N/A 04/21/2016   Procedure: Left Heart Cath and Coronary Angiography;  Surgeon: Lorretta Harp, MD;  Location: [place] CV LAB;  Service: Cardiovascular;  Laterality: N/A;   LIPOMA EXCISION  08/2005   occipital lipoma 1.5cm - by Dr. [name]   LITHOTRIPSY  ~ 2000   MAZE Left 09/20/10   for paroxysmal atrial fibrillation (Dr. Prescott Gum)   MITRAL VALVE REPLACEMENT  09/20/10    with a 27-mm pericardial porcine valve (Medtronic Mosaic valve, serial #[id number]). 09/20/10, Dr Prescott Gum   ORIF CLAVICLE FRACTURE Right 01/2004   by Thana Farr. [name], M.D for Right clavicle nonunion.; "it's got a pin in it"   POLYPECTOMY  05/29/2020   Procedure: POLYPECTOMY;  Surgeon: Clarene Essex, MD;  [facility];  Service: Endoscopy;;   POLYPECTOMY  07/19/2021   Procedure: POLYPECTOMY;  Surgeon: Clarene Essex, MD;  [facility];  Service: Gastroenterology;;   RADIOLOGY WITH ANESTHESIA Left 09/12/2020   Procedure: RADIOLOGY WITH ANESTHESIA- RENAL CRYOABLATION;  Surgeon: Aletta Edouard, MD;  [facility]: [facility];  Service: Radiology;  Laterality: Left;   REFRACTIVE SURGERY Bilateral    RIGHT HEART CATH N/A 04/23/2016   Procedure: Right Heart Cath;  Surgeon: [name], MD;  Location: [facility];  Service: Cardiovascular;  Laterality: N/A;   TONSILLECTOMY     TUBAL LIGATION      Allergies: Dexilant [dexlansoprazole], Lorazepam, Oxycontin [oxycodone], and Tramadol hcl  Medications: Prior to Admission medications   Medication Sig Start Date End Date Taking? Authorizing Provider  albuterol (PROAIR HFA) 108 (90 Base) MCG/ACT inhaler Inhale 2 puffs into the lungs every 6 (six) hours as needed for shortness of breath. 09/17/20   Sid Falcon, MD  ALPRAZolam [name]) 1 MG tablet Take 1/2  - 1 tablet (1/2  - 1 mg total) by mouth at bedtime as needed for anxiety or sleep. May take 1/2 tablet during the day if needed for anxiety 12/10/21   Angelica Pou, MD  Bacillus Coagulans-Inulin (ALIGN PREBIOTIC-PROBIOTIC PO) Take 1 capsule by mouth daily.    [provider]  benzonatate (TESSALON) 100 MG capsule Take 1 capsule (100 mg total) by mouth 3 (three) times daily as needed for cough. 08/02/20   Angelica Pou, MD  Blood Glucose Monitoring [facility] VERIO REFLECT) w/Device KIT Use to check blood sugar 1 time a day 05/29/21   [name], MD  buPROPion (WELLBUTRIN XL) 150 MG 24 hr tablet Take 1 tablet (150 mg total) by mouth every morning. 12/23/21   Angelica Pou, MD  cetirizine (ZYRTEC ALLERGY) 10  MG tablet Take 1 tablet (10 mg total) by mouth daily. 09/19/21 09/19/22  Angelica Pou, MD  clobetasol ointment (TEMOVATE) 0.05 % Apply to affected area(s) 2 times daily as needed for (irritation). 09/09/21   Angelica Pou, MD  Continuous Blood Gluc Sensor (FREESTYLE LIBRE 3 SENSOR) MISC Place 1 sensor on the skin every 14 days. Use to check glucose continuously 12/23/21   Angelica Pou, MD  DULoxetine (CYMBALTA) 60 MG capsule Take 1 capsule by mouth twice a day. Patient taking differently: Take 60 mg by mouth 2 (two) times daily. 01/30/21     fluticasone (FLONASE) 50 MCG/ACT nasal spray Place 2 sprays into both nostrils daily. 06/24/21   Angelica Pou, MD  Fluticasone-Umeclidin-Vilant 100-62.5-25 MCG/ACT AEPB Inhale 1 puff into the lungs daily. 01/28/21   Deneise Lever, MD  furosemide (LASIX) 80 MG tablet Take 1 tablet by mouth every morning AND 1/2 tablet daily in the afternoon. Patient taking differently: Take 1 tablet  80 mg by mouth every morning AND 1/2 tablet 40 mg  daily in the afternoon. 09/10/21   [name], MD  gabapentin (NEURONTIN) 300 MG capsule Take 1 capsule in the morning and 2 capsules at night Patient taking  differently: Take 300-600 mg by mouth See admin instructions. Take 1 capsule  (300 mg) in the morning and 2 capsules( 600 mg) at night 09/19/21   Angelica Pou, MD  HYDROcodone-acetaminophen (NORCO/VICODIN) 5-325 MG tablet Take 2 tablets by mouth 3 (three) times daily. Patient taking differently: Take 1-2 tablets by mouth 3 (three) times daily. 10/24/21   Angelica Pou, MD  insulin degludec (TRESIBA FLEXTOUCH) 100 UNIT/ML FlexTouch Pen Inject 43 Units into the skin daily. 08/01/21 01/19/22  Angelica Pou, MD  Insulin Pen Needle 32G X 4 MM MISC Use to inject insulin up to 6 times a day 08/04/17   [name], MD  ipratropium-albuterol (DUONEB) 0.5-2.5 (3) MG/3ML SOLN Take 3 mLs by nebulization every 6 (six) hours as needed (shortness of breath, wheezing). 11/12/20   Angelica Pou, MD  ketoconazole (NIZORAL) 2 % cream Apply 1 application topically at bedtime as needed for irritation. 07/17/21   Angelica Pou, MD  Lancets Misc. (ACCU-CHEK FASTCLIX LANCET) KIT Check your blood 4 times a day dx code 250.00 insulin requiring 01/19/13   [name], MD  lidocaine (LIDODERM) 5 % Place 1 or 2 patches to painful area of back each day.  Remove & Discard patch within 12 hours or as directed by MD 02/18/21   Angelica Pou, MD  metoprolol succinate (TOPROL-XL) 50 MG 24 hr tablet Take 1 tablet by mouth daily. Take with or immediately following a meal. 12/23/21   Angelica Pou, MD  nystatin powder Apply topically 3  times daily. Patient taking differently: Apply 1 Application topically 3 (three) times daily as needed (rash). 10/16/21   Angelica Pou, MD  omeprazole (PRILOSEC) 40 MG capsule Take 1 capsule by mouth in the morning and at bedtime. Patient taking differently: Take 40 mg by mouth 2 (two) times daily. 09/19/21   Angelica Pou, MD  ondansetron (ZOFRAN) 4 MG tablet Take 1 tablet (4 mg total) by mouth every 8 (eight) hours as needed for nausea or  vomiting. 12/23/21 12/23/22  Angelica Pou, MD  OXYGEN Inhale 2 L into the lungs at bedtime.    [provider]  potassium chloride SA (KLOR-CON M) 20 MEQ tablet Take 2 tablets (40 mEq total) by  mouth daily. 08/23/21   [name], MD  rosuvastatin (CRESTOR) 20 MG tablet TAKE 1 TABLET BY MOUTH ONCE EVERY NIGHT AT BEDTIME Patient taking differently: Take 20 mg by mouth at bedtime. 05/14/21   Angelica Pou, MD  Semaglutide,0.25 or 0.5MG/DOS, (OZEMPIC, 0.25 OR 0.5 MG/DOSE,) 2 MG/3ML SOPN Inject 0.5 mg into the skin once a week. Patient taking differently: Inject 0.5 mg into the skin once a week. Monday 06/18/21   Angelica Pou, MD  senna-docusate (SENOKOT S) 8.6-50 MG tablet Take 1 to 2 tablets by mouth once or twice a day as needed for constipation 07/04/21   Angelica Pou, MD  warfarin (COUMADIN) 2.5 MG tablet Take 1 tablet by mouth daily unless otherwise instructed. Patient taking differently: Take 1.25-2.5 mg by mouth See admin instructions. Taking 2.5 mg on Wed, Friday, Sunday, then 1.25 mg on Tuesday, Thursday, Saturday- not taking any on Monday. 02/05/21   Angelica Pou, MD     Family History  Problem Relation Age of Onset   Peptic Ulcer Disease Father    Heart attack Father 70       Died of MI at age 64   Heart attack Brother 8       Died of MI at age 20   Obesity Brother    Pneumonia Mother    Healthy Sister    Lupus Daughter    Obsessive Compulsive Disorder Daughter     Social History   Socioeconomic History   Marital status: Divorced    Spouse name: Not on file   Number of children: Not on file   Years of education: Not on file   Highest education level: Not on file  Occupational History   Not on file  Tobacco Use   Smoking status: Every Day    Packs/day: 1.00    Years: 55.00    Total pack years: 55.00    Types: Cigarettes    Start date: 11/01/1962   Smokeless tobacco: Never   Tobacco comments:    At least 1 PPD  Vaping  Use   Vaping Use: Never used  Substance and Sexual Activity   Alcohol use: Not Currently    Comment: 02/19/2018 "nothing since 1999"   Drug use: Not Currently    Types: Marijuana    Comment: 02/19/2018 "nothing since the 1990s"   Sexual activity: Not Currently    Birth control/protection: Post-menopausal  Other Topics Concern   Not on file  Social History Narrative   Lives alone in [place])   Worked at [employer] for 18 years   No car   Social Determinants of Radio broadcast assistant Strain: Low Risk  (02/09/2019)   Overall Financial Resource Strain (CARDIA)    Difficulty of Paying Living Expenses: Not hard at all  Food Insecurity: No Food Insecurity (12/28/2019)   Hunger Vital Sign    Worried About Running Out of Food in the Last Year: Never true    [place] in the Last Year: Never true  Transportation Needs: No Transportation Needs (12/28/2019)   PRAPARE - Hydrologist (Medical): No    Lack of Transportation (Non-Medical): No  Physical Activity: Inactive (02/09/2019)   Exercise Vital Sign    Days of Exercise per Week: 0 days    Minutes of Exercise per Session: 0 min  Stress: No Stress Concern Present (02/09/2019)   [place]  Feeling of Stress : Only a little  Social Connections: Unknown (02/09/2019)   Social Connection and Isolation Panel [NHANES]    Frequency of Communication with Friends and Family: More than three times a week    Frequency of Social Gatherings with Friends and Family: Not on file    Attends Religious Services: Not on Advertising copywriter or Organizations: Not on file    Attends Archivist Meetings: Not on file    Marital Status: Divorced    ECOG Status: 0 - Asymptomatic  Review of Systems  Constitutional: Negative.   Respiratory: Negative.    Cardiovascular: Negative.   Gastrointestinal:  Negative.   Genitourinary: Negative.   Musculoskeletal: Negative.   Neurological:        Recent falls, but without focal neurologic symptoms.    Review of Systems: A 12 point ROS discussed and pertinent positives are indicated in the HPI above.  All other systems are negative.   Physical Exam No direct physical exam was performed (except for noted visual exam findings with Video Visits).   Vital Signs: There were no vitals taken for this visit.  Imaging: CT ABDOMEN W WO CONTRAST  Result Date: 12/19/2021 CLINICAL DATA:  Follow-up left renal cryoablation. * Tracking Code: BO * EXAM: CT ABDOMEN WITHOUT AND WITH CONTRAST TECHNIQUE: Multidetector CT imaging of the abdomen was performed following the standard protocol before and following the bolus administration of intravenous contrast. RADIATION DOSE REDUCTION: This exam was performed according to the departmental dose-optimization program which includes automated exposure control, adjustment of the mA and/or kV according to patient size and/or use of iterative reconstruction technique. CONTRAST:  122m OMNIPAQUE IOHEXOL 300 MG/ML  SOLN COMPARISON:  Multiple priors including CT December 18, 2020 and August 16, 2021 FINDINGS: Lower chest: No acute abnormality. Hepatobiliary: No suspicious hepatic lesion. Gallbladder is unremarkable. No biliary ductal dilation. Pancreas: No pancreatic ductal dilation or evidence of acute inflammation. Spleen: No splenomegaly. Adrenals/Urinary Tract: Similar thickening of the left adrenal gland without discrete nodularity. Right adrenal gland appears normal. Decreased soft tissue in the cryoablation defect of the left upper pole now measuring 19 x 9 mm on image 33/6 previously 28 x 14 mm common no suspicious postcontrast enhancement. Left lower pole peripherally calcified heterogeneous 3.1 x 2.7 cm does not demonstrate suspicious postcontrast enhancement and is stable in size from prior. Fluid density nonenhancing 12 mm  right renal cyst is considered benign and requires no imaging follow-up. Nonobstructive bilateral renal stones. Stomach/Bowel: Stomach is unremarkable for degree of distension. No pathologic dilation or evidence of acute inflammation involving loops of large or small bowel in the abdomen. Moderate volume of formed stool in the colon suggestive of constipation. Visualized portions of the appendix appear within normal limits. Vascular/Lymphatic: Aortic atherosclerosis. Smooth IVC contours. No tumor in renal vein. No pathologically enlarged abdominal lymph nodes. Other: No significant abdominopelvic free fluid. Musculoskeletal: No aggressive lytic or blastic lesion of bone. Remote displaced left eleventh rib fracture. IMPRESSION: 1. Continued evolution of the cryoablation defect of the left upper pole without evidence of recurrent disease. 2. Stable chronic ablation lesion in the left lower pole without evidence of recurrent disease. 3. No evidence of metastatic disease in the abdomen. 4. Nonobstructive bilateral renal stones. 5. Moderate volume of formed stool in the colon suggestive of constipation. 6.  Aortic Atherosclerosis (ICD10-I70.0). Electronically Signed   By: JDahlia BailiffM.D.   On: 12/19/2021 15:58    Labs:  CBC: Recent Labs  12/27/20 1219 05/02/21 1038 07/04/21 1200 08/16/21 1701 08/16/21 1719  WBC 7.6 7.3 7.5 9.0  --   HGB 11.8 10.2* 10.8* 10.3* 11.2*  HCT 37.4 32.5* 33.8* 35.2* 33.0*  PLT 214 243 217 202  --     COAGS: Recent Labs    08/16/21 1701  INR 2.0*  APTT 32    BMP: Recent Labs    05/02/21 1038 08/16/21 1701 08/16/21 1719 08/23/21 1125 11/07/21 1030  NA 138 138 136 136 136  K 4.6 5.2* 5.1 5.1 5.2  CL 100 103 102 101 97  CO2 26 26  --  27 27  GLUCOSE 165* 189* 185* 172* 128*  BUN _0 CALCIUM 9.5 9.4  --  8.9 9.4  CREATININE 1.04* 1.22* 1.20* 1.22* 1.01*  GFRNONAA  --  47*  --  47*  --     LIVER FUNCTION TESTS: Recent Labs     08/16/21 1701  BILITOT 0.5  AST 13*  ALT 9  ALKPHOS 46  PROT 6.5  ALBUMIN 3.7    Assessment and Plan:  I spoke with [name] over the phone.  I reviewed findings from the follow-up CT dated 12/17/2021.  This demonstrates further retraction of the ablation defect along the lateral upper pole of the left kidney with no evidence of recurrent carcinoma at the site of more recent ablation.  The older ablation site along the lower pole of the left kidney demonstrates stable appearance and calcification without evidence of carcinoma recurrence.  No new renal lesions are identified.  I recommended a follow-up CT in 1 year.   Electronically Signed: Azzie Roup 12/25/2021, 8:21 AM    I spent a total of  10 Minutes in remote  clinical consultation, greater than 50% of which was counseling/coordinating care post ablation of left renal carcinomas.    Visit type: Audio only (telephone). Audio (no video) only due to patient's lack of internet/smartphone capability. Alternative for in-person consultation at [facility], [address], [place], [place]. This visit type was conducted due to national recommendations for restrictions regarding the COVID-19 Pandemic (e.g. social distancing).  This format is felt to be most appropriate for this patient at this time.  All issues noted in this document were discussed and addressed.

## 2021-12-26 ENCOUNTER — Other Ambulatory Visit: Payer: Self-pay

## 2021-12-27 ENCOUNTER — Other Ambulatory Visit (HOSPITAL_COMMUNITY): Payer: Self-pay

## 2021-12-27 ENCOUNTER — Other Ambulatory Visit: Payer: Self-pay

## 2021-12-27 DIAGNOSIS — M545 Low back pain, unspecified: Secondary | ICD-10-CM

## 2021-12-27 DIAGNOSIS — G8929 Other chronic pain: Secondary | ICD-10-CM

## 2021-12-27 MED ORDER — HYDROCODONE-ACETAMINOPHEN 5-325 MG PO TABS
1.0000 | ORAL_TABLET | Freq: Three times a day (TID) | ORAL | 0 refills | Status: DC
  Filled 2021-12-27: qty 180, 30d supply, fill #0

## 2021-12-27 MED ORDER — LIDOCAINE 5 % EX PTCH
MEDICATED_PATCH | CUTANEOUS | 2 refills | Status: DC
  Filled 2021-12-27: qty 60, 30d supply, fill #0
  Filled 2021-12-28: qty 45, 23d supply, fill #0
  Filled 2021-12-28: qty 15, 7d supply, fill #0

## 2021-12-28 ENCOUNTER — Other Ambulatory Visit (HOSPITAL_COMMUNITY): Payer: Self-pay

## 2021-12-30 ENCOUNTER — Telehealth: Payer: Self-pay | Admitting: *Deleted

## 2021-12-30 NOTE — Telephone Encounter (Signed)
Patient provided results of PST FS POC INR value for today = 1.8 on Su-2.64mM-0mgT-1.25mgW-2.5mgTh-1.25mgF-2.5mgSa-1.25mg Using 2.58mstrength green-colored warfarin tablets. CONTINUE same regimen. Repeat INR 27-NOV-23.   Denies symptoms/signs of bleeding or embolic disease. Adequate supply of warfarin on hand she states.

## 2021-12-30 NOTE — Telephone Encounter (Signed)
Contacted regarding PREP referral, Left voice message for return call.

## 2021-12-31 ENCOUNTER — Other Ambulatory Visit (HOSPITAL_COMMUNITY): Payer: Self-pay

## 2022-01-06 ENCOUNTER — Telehealth: Payer: Self-pay | Admitting: Pharmacist

## 2022-01-06 NOTE — Telephone Encounter (Signed)
Received fax from [facility] with INR results from 08/26/21 to 01/06/22. Last INR 2.0 on 11/27. Results placed in Dr. Gladstone Pih office.

## 2022-01-06 NOTE — Telephone Encounter (Signed)
Patient reports PST FS POC INR results of 2.0 on 11.30m warfarin/wk using the 2.579mstrength green warfarin tablets as:Su-2.71m2m0mgT-1.271mgW-2.71mgTh-1.271mgF-2.71mgSa-1.271mg. Continue same regimen. Repeat INR 4DEC23. No bleeding or embolic event signs or symptoms she states.

## 2022-01-07 ENCOUNTER — Telehealth: Payer: Self-pay | Admitting: *Deleted

## 2022-01-07 ENCOUNTER — Other Ambulatory Visit (HOSPITAL_COMMUNITY): Payer: Self-pay

## 2022-01-07 ENCOUNTER — Telehealth: Payer: Self-pay

## 2022-01-07 NOTE — Telephone Encounter (Addendum)
Call from pt requesting to talk to [place]. Stated she's going thru something mentally and physically - pt did not want to go into details. Denies harming herself or someone else. Offered pt [employer] phone # - pt denied. Stated she wanted to talk Lauren.

## 2022-01-08 NOTE — Telephone Encounter (Signed)
Returned call to patient. States she is sad. States this happens every year this time of year. Denies SI/HI. Goes on to talk about upsetting situation with her granddaughter regarding christmas presents and lack of gratitude. Explained we have new IBH. She would appreciate a phone call from [facility] at her earliest appt.

## 2022-01-11 IMAGING — CR DG ABDOMEN 1V
2 series · 2 of 2 positions shown · non-contrast
Comparison: Abdominal CT 10/07/2016

CLINICAL DATA: Fecal incontinence with history of constipation,
suspect stool retention.

EXAM:
ABDOMEN - 1 VIEW

[abdomen kub (1 of 2)]
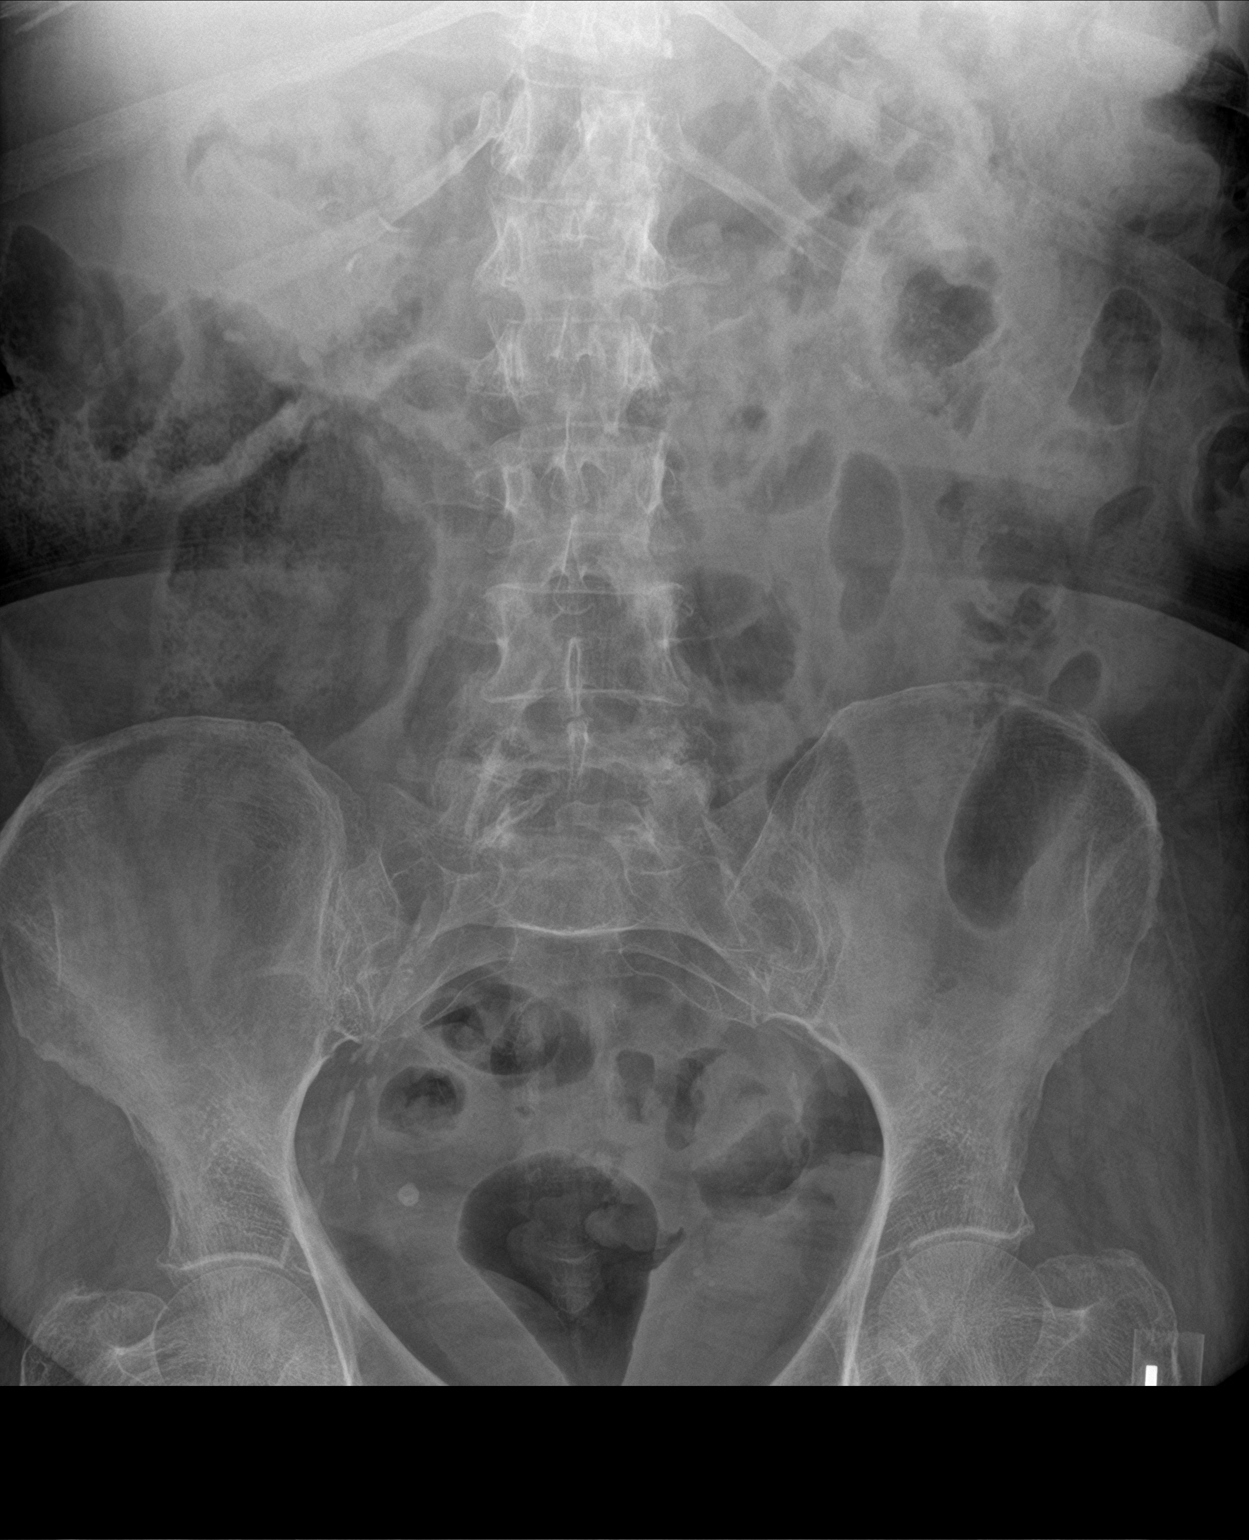

[abdomen kub (2 of 2)]
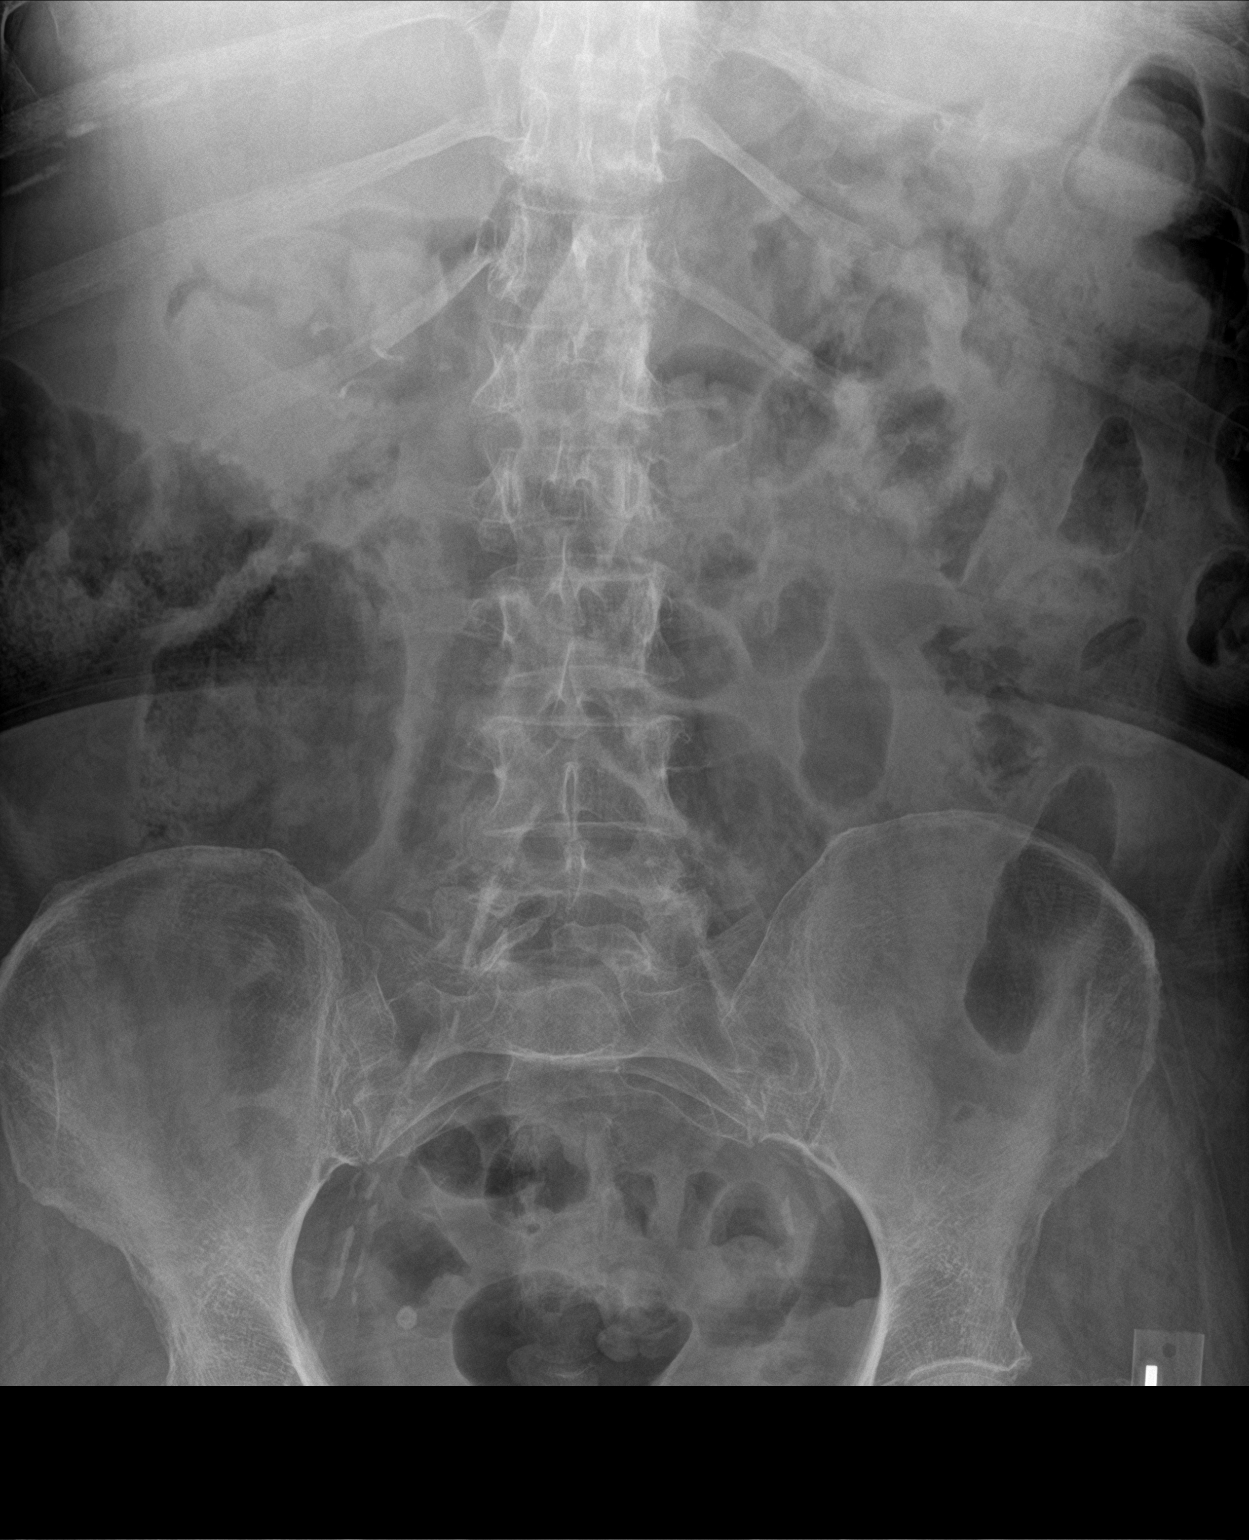

[2 of 2 positions shown; findings below may reference images not displayed]

FINDINGS: Moderate stool in the ascending and transverse colon. Small volume
of stool in the proximal descending and sigmoid colon. Air within
the rectum. No small bowel dilatation or evidence of obstruction.
Vascular calcifications in the upper abdomen and pelvis.
Calcifications project over the right renal hilum that appear
vascular, the previously seen stone is not definitively visualized
by radiograph. Right pelvic calcification is typical of phleboliths.
No acute osseous abnormalities are seen.
IMPRESSION: Moderate stool in the ascending and transverse colon. Small volume
of stool in the distal colon. Air within the rectum.

## 2022-01-13 ENCOUNTER — Telehealth: Payer: Self-pay | Admitting: Pharmacist

## 2022-01-13 ENCOUNTER — Telehealth: Payer: Self-pay | Admitting: *Deleted

## 2022-01-13 NOTE — Telephone Encounter (Signed)
Patient reports PST FS POC INR Result = 2.0 today, target range is 1.5 - 2.5. She takes 1 x 2.5 milligram strength warfarin tablet on Sunday, Wednesday and Friday. Tuesday, Thursday and Saturday, she takes 1/2 of 2.5 milligram warfarin tablet (dose 1.54m). On Mondays, she takes 0 milligrams (no dose). Patient was instructed to continue same dosing regimen and repeat patient self testing, finger-stick, point-of-care INR test on Monday 11-DEC-23.

## 2022-01-13 NOTE — Progress Notes (Signed)
  Care Coordination   Note   01/13/2022 Name: [name] MRN: [id number] DOB: 06-02-1950  [name] is a 71 y.o. year old female who sees [name], [name], MD for primary care. I reached out to [name] by phone today to offer care coordination services.  [name] was given information about Care Coordination services today including:   The Care Coordination services include support from the care team which includes your Nurse Coordinator, Clinical Social Worker, or Pharmacist.  The Care Coordination team is here to help remove barriers to the health concerns and goals most important to you. Care Coordination services are voluntary, and the patient may decline or stop services at any time by request to their care team member.   Care Coordination Consent Status: Patient agreed to services and verbal consent obtained.   Follow up plan:  Telephone appointment with care coordination team member scheduled for:  01/15/22  Encounter Outcome:  Pt. Scheduled  [place]  Direct Dial: [phone]

## 2022-01-13 NOTE — Telephone Encounter (Signed)
INR reported as 2.0 (target range 1.5 - 2.5). Continue taking 1x2.89m on SuWedFri; 1/2 x 2.572m(1.251mose) all other days EXCEPT MONDAY--no warfarin each Monday.

## 2022-01-13 NOTE — Progress Notes (Signed)
  Care Coordination  Outreach Note  01/13/2022 Name: [name] MRN: [id number] DOB: 07-01-1950   Care Coordination Outreach Attempts: An unsuccessful telephone outreach was attempted today to offer the patient information about available care coordination services as a benefit of their health plan.   Follow Up Plan:  Additional outreach attempts will be made to offer the patient care coordination information and services.   Encounter Outcome:  No Answer  [place]  Direct Dial: [phone]

## 2022-01-15 ENCOUNTER — Ambulatory Visit: Payer: Self-pay | Admitting: *Deleted

## 2022-01-15 ENCOUNTER — Encounter (HOSPITAL_COMMUNITY): Payer: Self-pay | Admitting: Emergency Medicine

## 2022-01-15 ENCOUNTER — Emergency Department (HOSPITAL_COMMUNITY): Payer: HMO

## 2022-01-15 ENCOUNTER — Other Ambulatory Visit: Payer: Self-pay

## 2022-01-15 ENCOUNTER — Emergency Department (HOSPITAL_COMMUNITY)
Admission: EM | Admit: 2022-01-15 | Discharge: 2022-01-15 | Disposition: A | Discharge status: Home / Self Care | Payer: HMO | Attending: Emergency Medicine | Admitting: Emergency Medicine

## 2022-01-15 ENCOUNTER — Other Ambulatory Visit: Payer: Self-pay | Admitting: Internal Medicine

## 2022-01-15 DIAGNOSIS — W19XXXA Unspecified fall, initial encounter: Secondary | ICD-10-CM

## 2022-01-15 DIAGNOSIS — Z794 Long term (current) use of insulin: Secondary | ICD-10-CM | POA: Insufficient documentation

## 2022-01-15 DIAGNOSIS — S0003XA Contusion of scalp, initial encounter: Secondary | ICD-10-CM | POA: Insufficient documentation

## 2022-01-15 DIAGNOSIS — D649 Anemia, unspecified: Secondary | ICD-10-CM | POA: Insufficient documentation

## 2022-01-15 DIAGNOSIS — Z7901 Long term (current) use of anticoagulants: Secondary | ICD-10-CM | POA: Insufficient documentation

## 2022-01-15 DIAGNOSIS — D509 Iron deficiency anemia, unspecified: Secondary | ICD-10-CM

## 2022-01-15 DIAGNOSIS — F321 Major depressive disorder, single episode, moderate: Secondary | ICD-10-CM

## 2022-01-15 DIAGNOSIS — W07XXXA Fall from chair, initial encounter: Secondary | ICD-10-CM | POA: Diagnosis not present

## 2022-01-15 DIAGNOSIS — R42 Dizziness and giddiness: Secondary | ICD-10-CM | POA: Diagnosis present

## 2022-01-15 LAB — COMPREHENSIVE METABOLIC PANEL
ALT: 11 U/L (ref 0–44)
AST: 15 U/L (ref 15–41)
Albumin: 3.4 g/dL — ABNORMAL LOW (ref 3.5–5.0)
Alkaline Phosphatase: 51 U/L (ref 38–126)
Anion gap: 14 (ref 5–15)
BUN: 25 mg/dL — ABNORMAL HIGH (ref 8–23)
CO2: 27 mmol/L (ref 22–32)
Calcium: 10.4 mg/dL — ABNORMAL HIGH (ref 8.9–10.3)
Chloride: 100 mmol/L (ref 98–111)
Creatinine, Ser: 1.29 mg/dL — ABNORMAL HIGH (ref 0.44–1.00)
GFR, Estimated: 44 mL/min — ABNORMAL LOW (ref >60)
Glucose, Bld: 157 mg/dL — ABNORMAL HIGH (ref 70–99)
Potassium: 3.9 mmol/L (ref 3.5–5.1)
Sodium: 141 mmol/L (ref 135–145)
Total Bilirubin: 0.4 mg/dL (ref 0.3–1.2)
Total Protein: 6.2 g/dL — ABNORMAL LOW (ref 6.5–8.1)

## 2022-01-15 LAB — CBC WITH DIFFERENTIAL/PLATELET
Abs Immature Granulocytes: 0.03 10*3/uL (ref 0.00–0.07)
Basophils Absolute: 0 10*3/uL (ref 0.0–0.1)
Basophils Relative: 1 %
Eosinophils Absolute: 0.1 10*3/uL (ref 0.0–0.5)
Eosinophils Relative: 2 %
HCT: 28.2 % — ABNORMAL LOW (ref 36.0–46.0)
Hemoglobin: 8 g/dL — ABNORMAL LOW (ref 12.0–15.0)
Immature Granulocytes: 0 %
Lymphocytes Relative: 25 %
Lymphs Abs: 1.7 10*3/uL (ref 0.7–4.0)
MCH: 21.2 pg — ABNORMAL LOW (ref 26.0–34.0)
MCHC: 28.4 g/dL — ABNORMAL LOW (ref 30.0–36.0)
MCV: 74.6 fL — ABNORMAL LOW (ref 80.0–100.0)
Monocytes Absolute: 0.8 10*3/uL (ref 0.1–1.0)
Monocytes Relative: 11 %
Neutro Abs: 4.3 10*3/uL (ref 1.7–7.7)
Neutrophils Relative %: 61 %
Platelets: 214 10*3/uL (ref 150–400)
RBC: 3.78 MIL/uL — ABNORMAL LOW (ref 3.87–5.11)
RDW: 18.7 % — ABNORMAL HIGH (ref 11.5–15.5)
WBC: 7 10*3/uL (ref 4.0–10.5)
nRBC: 0 % (ref 0.0–0.2)

## 2022-01-15 LAB — POC OCCULT BLOOD, ED: Fecal Occult Bld: NEGATIVE

## 2022-01-15 LAB — PROTIME-INR
INR: 1.6 — ABNORMAL HIGH (ref 0.8–1.2)
Prothrombin Time: 18.6 seconds — ABNORMAL HIGH (ref 11.4–15.2)

## 2022-01-15 LAB — CBG MONITORING, ED: Glucose-Capillary: 167 mg/dL — ABNORMAL HIGH (ref 70–99)

## 2022-01-15 MED ORDER — ONDANSETRON HCL 4 MG/2ML IJ SOLN
4.0000 mg | Freq: Once | INTRAMUSCULAR | Status: AC
  Administered 2022-01-15: 4 mg via INTRAVENOUS
  Filled 2022-01-15: qty 2

## 2022-01-15 MED ORDER — ACETAMINOPHEN 325 MG PO TABS: 650.0000 mg | ORAL_TABLET | Freq: Four times a day (QID) | ORAL | Status: DC | PRN

## 2022-01-15 MED ORDER — HYDROCODONE-ACETAMINOPHEN 5-325 MG PO TABS
2.0000 | ORAL_TABLET | Freq: Once | ORAL | Status: AC
  Administered 2022-01-15: 2 via ORAL
  Filled 2022-01-15: qty 2

## 2022-01-15 NOTE — ED Provider Notes (Signed)
[facility] EMERGENCY DEPARTMENT Provider Note   CSN: [id number] Arrival date & time: 01/15/22  [id number]     History  Chief Complaint  Patient presents with   [name] is a 71 y.o. female.  71 year old female who presents the ER today after a fall.  Patient fell asleep in her recliner and fell forward hitting the front right side of her head.  No syncope.  During my evaluation patient states all of a sudden that she was dizzy and nauseous.  Did not hurt anything else.  Eating and drinking normally recently.  Is on Coumadin for A-fib and thus was brought in as a level 2 trauma activation.   Fall       Home Medications Prior to Admission medications   Medication Sig Start Date End Date Taking? Authorizing Provider  albuterol (PROAIR HFA) 108 (90 Base) MCG/ACT inhaler Inhale 2 puffs into the lungs every 6 (six) hours as needed for shortness of breath. 09/17/20   Sid Falcon, MD  ALPRAZolam [name]) 1 MG tablet Take 1/2 - 1 tablet (1/2  - 1 mg total) by mouth at bedtime as needed for anxiety or sleep. May take 1/2 tablet during the day if needed for anxiety 12/10/21   Angelica Pou, MD  Bacillus Coagulans-Inulin (ALIGN PREBIOTIC-PROBIOTIC PO) Take 1 capsule by mouth daily.    [provider]  benzonatate (TESSALON) 100 MG capsule Take 1 capsule (100 mg total) by mouth 3 (three) times daily as needed for cough. 08/02/20   Angelica Pou, MD  Blood Glucose Monitoring Suppl [facility] VERIO REFLECT) w/Device KIT Use to check blood sugar 1 time a day 05/29/21   [name], MD  buPROPion (WELLBUTRIN XL) 150 MG 24 hr tablet Take 1 tablet (150 mg total) by mouth every morning. 12/23/21   Angelica Pou, MD  cetirizine (ZYRTEC ALLERGY) 10 MG tablet Take 1 tablet (10 mg total) by mouth daily. 09/19/21 09/19/22  Angelica Pou, MD  clobetasol ointment (TEMOVATE) 0.05 % Apply to affected area(s) 2 times daily as needed for  (irritation). 09/09/21   Angelica Pou, MD  Continuous Blood Gluc Sensor (FREESTYLE LIBRE 3 SENSOR) MISC Place 1 sensor on the skin every 14 days. Use to check glucose continuously 12/23/21   Angelica Pou, MD  DULoxetine (CYMBALTA) 60 MG capsule Take 1 capsule by mouth twice a day. Patient taking differently: Take 60 mg by mouth 2 (two) times daily. 01/30/21     fluticasone (FLONASE) 50 MCG/ACT nasal spray Place 2 sprays into both nostrils daily. 06/24/21   Angelica Pou, MD  Fluticasone-Umeclidin-Vilant 100-62.5-25 MCG/ACT AEPB Inhale 1 puff into the lungs daily. 01/28/21   Deneise Lever, MD  furosemide (LASIX) 80 MG tablet Take 1 tablet by mouth every morning AND 1/2 tablet daily in the afternoon. Patient taking differently: Take 1 tablet  80 mg by mouth every morning AND 1/2 tablet 40 mg  daily in the afternoon. 09/10/21   [name], MD  gabapentin (NEURONTIN) 300 MG capsule Take 1 capsule in the morning and 2 capsules at night Patient taking differently: Take 300-600 mg by mouth See admin instructions. Take 1 capsule  (300 mg) in the morning and 2 capsules( 600 mg) at night 09/19/21   Angelica Pou, MD  HYDROcodone-acetaminophen (NORCO/VICODIN) 5-325 MG tablet Take 1-2 tablets by mouth 3 (three) times daily. 12/27/21   Angelica Pou, MD  insulin degludec [facility])  100 UNIT/ML FlexTouch Pen Inject 43 Units into the skin daily. 08/01/21 01/31/22  Angelica Pou, MD  Insulin Pen Needle 32G X 4 MM MISC Use to inject insulin up to 6 times a day 08/04/17   [name], MD  ipratropium-albuterol (DUONEB) 0.5-2.5 (3) MG/3ML SOLN Take 3 mLs by nebulization every 6 (six) hours as needed (shortness of breath, wheezing). 11/12/20   Angelica Pou, MD  ketoconazole (NIZORAL) 2 % cream Apply 1 application topically at bedtime as needed for irritation. 07/17/21   Angelica Pou, MD  Lancets Misc. (ACCU-CHEK FASTCLIX LANCET) KIT Check your blood  4 times a day dx code 250.00 insulin requiring 01/19/13   [name], MD  lidocaine (LIDODERM) 5 % Place 1 or 2 patches to painful area of back each day.  Remove & Discard patch within 12 hours or as directed by MD 12/27/21   Angelica Pou, MD  metoprolol succinate (TOPROL-XL) 50 MG 24 hr tablet Take 1 tablet by mouth daily. Take with or immediately following a meal. 12/23/21   Angelica Pou, MD  nystatin powder Apply topically 3  times daily. Patient taking differently: Apply 1 Application topically 3 (three) times daily as needed (rash). 10/16/21   Angelica Pou, MD  omeprazole (PRILOSEC) 40 MG capsule Take 1 capsule by mouth in the morning and at bedtime. Patient taking differently: Take 40 mg by mouth 2 (two) times daily. 09/19/21   Angelica Pou, MD  ondansetron (ZOFRAN) 4 MG tablet Take 1 tablet (4 mg total) by mouth every 8 (eight) hours as needed for nausea or vomiting. 12/23/21 12/23/22  Angelica Pou, MD  OXYGEN Inhale 2 L into the lungs at bedtime.    [provider]  potassium chloride SA (KLOR-CON M) 20 MEQ tablet Take 2 tablets (40 mEq total) by mouth daily. 08/23/21   [name], MD  rosuvastatin (CRESTOR) 20 MG tablet TAKE 1 TABLET BY MOUTH ONCE EVERY NIGHT AT BEDTIME Patient taking differently: Take 20 mg by mouth at bedtime. 05/14/21   Angelica Pou, MD  Semaglutide,0.25 or 0.5MG/DOS, (OZEMPIC, 0.25 OR 0.5 MG/DOSE,) 2 MG/3ML SOPN Inject 0.5 mg into the skin once a week. Patient taking differently: Inject 0.5 mg into the skin once a week. Monday 06/18/21   Angelica Pou, MD  senna-docusate (SENOKOT S) 8.6-50 MG tablet Take 1 to 2 tablets by mouth once or twice a day as needed for constipation 07/04/21   Angelica Pou, MD  warfarin (COUMADIN) 2.5 MG tablet Take 1 tablet by mouth daily unless otherwise instructed. Patient taking differently: Take 1.25-2.5 mg by mouth See admin instructions. Taking 2.5 mg on Wed, Friday,  Sunday, then 1.25 mg on Tuesday, Thursday, Saturday- not taking any on Monday. 02/05/21   Angelica Pou, MD      Allergies    Dexilant [dexlansoprazole], Lorazepam, Oxycontin [oxycodone], and Tramadol hcl    Review of Systems   Review of Systems  Physical Exam Updated Vital Signs BP (!) 131/112   Pulse 69   Temp 98 F (36.7 C) (Oral)   Resp 20   SpO2 100%  Physical Exam Vitals and nursing note reviewed.  Constitutional:      Appearance: She is well-developed.  HENT:     Head: Normocephalic.     Comments: Small hematoma to right parietal scalp without open wound    Mouth/Throat:     Mouth: Mucous membranes are dry.  Eyes:     Pupils:  Pupils are equal, round, and reactive to light.  Cardiovascular:     Rate and Rhythm: Normal rate and regular rhythm.  Pulmonary:     Effort: No respiratory distress.     Breath sounds: No stridor.  Abdominal:     General: Abdomen is flat. There is no distension.  Musculoskeletal:     Cervical back: Normal range of motion.     Comments: No cervical spine tenderness, thoracic spine tenderness or Lumbar spine tenderness.  No tenderness or pain with palpation and full ROM of all joints in upper and lower extremities.  No ecchymosis or other signs of trauma on back or extremities.  No Pain with AP or lateral compression of ribs.  No Paracervical ttp, paraspinal ttp   Neurological:     Mental Status: She is alert.     ED Results / Procedures / Treatments   Labs (all labs ordered are listed, but only abnormal results are displayed) Labs Reviewed  CBC WITH DIFFERENTIAL/PLATELET - Abnormal; Notable for the following components:      Result Value   RBC 3.78 (*)    Hemoglobin 8.0 (*)    HCT 28.2 (*)    MCV 74.6 (*)    MCH 21.2 (*)    MCHC 28.4 (*)    RDW 18.7 (*)    All other components within normal limits  COMPREHENSIVE METABOLIC PANEL - Abnormal; Notable for the following components:   Glucose, Bld 157 (*)    BUN 25 (*)     Creatinine, Ser 1.29 (*)    Calcium 10.4 (*)    Total Protein 6.2 (*)    Albumin 3.4 (*)    GFR, Estimated 44 (*)    All other components within normal limits  PROTIME-INR - Abnormal; Notable for the following components:   Prothrombin Time 18.6 (*)    INR 1.6 (*)    All other components within normal limits  CBG MONITORING, ED - Abnormal; Notable for the following components:   Glucose-Capillary 167 (*)    All other components within normal limits  CBG MONITORING, ED  POC OCCULT BLOOD, ED    EKG EKG Interpretation  Date/Time:  Wednesday January 15 2022 04:48:38 EST Ventricular Rate:  69 PR Interval:  244 QRS Duration: 100 QT Interval:  439 QTC Calculation: 471 R Axis:   195 Text Interpretation: Sinus rhythm Prolonged PR interval Right axis deviation Low voltage, precordial leads Confirmed by Merrily Pew [phone]) on 01/15/2022 6:14:53 AM  Radiology CT Cervical Spine Wo Contrast  Result Date: 01/15/2022 CLINICAL DATA:  71 year old female status post fall, struck head. Headache, dizziness. EXAM: CT CERVICAL SPINE WITHOUT CONTRAST TECHNIQUE: Multidetector CT imaging of the cervical spine was performed without intravenous contrast. Multiplanar CT image reconstructions were also generated. RADIATION DOSE REDUCTION: This exam was performed according to the departmental dose-optimization program which includes automated exposure control, adjustment of the mA and/or kV according to patient size and/or use of iterative reconstruction technique. COMPARISON:  Cervical spine CT 10/30/2021. FINDINGS: Alignment: Maintained cervical lordosis. Cervicothoracic junction alignment is within normal limits. Bilateral posterior element alignment is within normal limits. Skull base and vertebrae: Visualized skull base is intact. No atlanto-occipital dissociation. C1 and C2 appear intact and aligned. Intermittent mild motion artifact, most pronounced at C5. No acute osseous abnormality identified. Soft  tissues and spinal canal: No prevertebral fluid or swelling. No visible canal hematoma. Stable chronic right neck surgical clips, perhaps from previous endarterectomy. Disc levels: Mild for age cervical spine degeneration and  capacious spinal canal redemonstrated. Upper chest: Chronic right clavicle ORIF redemonstrated. Centrilobular emphysema in the lung apices. Calcified great vessel atherosclerosis. Visible upper thoracic vertebrae appear grossly intact. IMPRESSION: 1. Mild motion artifact. No acute traumatic injury identified in the cervical spine. 2.  Emphysema (ICD10-J43.9). Electronically Signed   By: Genevie Ann M.D.   On: 01/15/2022 05:25   CT Head Wo Contrast  Result Date: 01/15/2022 CLINICAL DATA:  71 year old female status post fall, struck head. Headache, dizziness. EXAM: CT HEAD WITHOUT CONTRAST TECHNIQUE: Contiguous axial images were obtained from the base of the skull through the vertex without intravenous contrast. RADIATION DOSE REDUCTION: This exam was performed according to the departmental dose-optimization program which includes automated exposure control, adjustment of the mA and/or kV according to patient size and/or use of iterative reconstruction technique. COMPARISON:  Brain MRI 03/02/2018.  Head CT 10/30/2021. FINDINGS: Brain: Stable cerebral volume. No midline shift, ventriculomegaly, mass effect, evidence of mass lesion, intracranial hemorrhage or evidence of cortically based acute infarction. Gray-white matter differentiation is within normal limits throughout the brain. Match that gray-white differentiation remains within normal limits for age. Basal ganglia vascular calcifications are stable. Vascular: Calcified atherosclerosis at the skull base. No suspicious intracranial vascular hyperdensity. Skull: Stable, No acute osseous abnormality identified. Sinuses/Orbits: Subtotal opacification of the left maxillary sinus with lobulated mucosal thickening and bubbly opacity has not  significantly changed since September. Other Visualized paranasal sinuses and mastoids are clear. Other: No convincing orbit or scalp soft tissue injury. IMPRESSION: 1. No acute traumatic injury identified. Stable and negative for age noncontrast CT appearance of the brain. 2. Stable left maxillary sinusitis. Electronically Signed   By: Genevie Ann M.D.   On: 01/15/2022 05:22    Procedures Procedures    Medications Ordered in ED Medications  acetaminophen (TYLENOL) tablet 650 mg (has no administration in time range)  ondansetron (ZOFRAN) injection 4 mg (4 mg Intravenous Given 01/15/22 0456)  HYDROcodone-acetaminophen (NORCO/VICODIN) 5-325 MG per tablet 2 tablet (2 tablets Oral Given 01/15/22 0631)    ED Course/ Medical Decision Making/ A&P                           Medical Decision Making Amount and/or Complexity of Data Reviewed Labs: ordered. Radiology: ordered. ECG/medicine tests: ordered.  Risk OTC drugs. Prescription drug management.   CT head and neck are normal.  I suspect that the fall was just from a balance issue and she has a history of multiple falls and generalized weakness with generalized pain as well.  Of note her hemoglobin did end up being a full 2 points lower than her baseline.  She has a history of anemia of chronic disease and is being treated for that through her primary doctors however this acute change will get Hemoccult to make sure is not GI bleeding and otherwise can follow-up with her doctor for repeat labs. Hemoccult negative. No e/o hemorrhagic shock here, normal VS. Patient states she hasn't been taking iron at home because it doesn't agree with her stomach. Will fu w/ PCP regarding same.   CT head/neck done and showed no obvious injuries (independently viewed and interpreted by myself and radiology read reviewed).  Final Clinical Impression(s) / ED Diagnoses Final diagnoses:  Fall, initial encounter  Anemia, unspecified type    Rx / DC Orders ED  Discharge Orders     None         Abdoulaye Drum, Corene Cornea, MD 01/15/22 [phone]

## 2022-01-15 NOTE — Patient Outreach (Signed)
Care Coordination   Initial Visit Note   01/16/2022 Name: [name] MRN: [id number] DOB: 04/08/1950  [name] is a 71 y.o. year old female who sees [name], [name], MD for primary care. I spoke with  [name] by phone today.  What matters to the patients health and wellness today?  Pt with depression and needing support, community resource linking and counseling.     Goals Addressed             This Visit's Progress    Provide support, resources and mental health support       Care Coordination Interventions:  CSW made contact with pt today to discuss "sadness". Pt reports she has "lots to talk about" but nothing specific today. "I can be stubborn". Pt was released from the ER this AM after a fall last night at home. Pt lives alone, does not drive and has a sister, [name], who helps her with rides, getting groceries, RX, etc.  She also has a neighbor who is helpful with getting mail, etc.  She also reports receiving Meals on Wheels and what sounds like partial Medicaid. CSW reminded pt of her [employer] benefits- reviewed several with her. Pt acknowledges having depression; PHQ9 depression screening completed today- pt scoring "14" (improved from "17").  Pt denies SI/HI. She is open to considering mental health counseling and is aware Milus Height, LCSW-A, will be providing this in PCP's office later this month.  Pt is also interested in getting information on life alert, [place] activities in [place] and [place] application assistance. Will ask our [employer] to follow up with pt on these opportunities to enhance pt's overall quality of life/depression.      01/15/2022    1:50 PM 11/07/2021   11:44 AM 09/19/2021   11:55 AM 08/22/2021   11:16 AM 08/01/2021   11:04 AM  Depression screen PHQ 2/9  Decreased Interest 3 0 3 0 0  Down, Depressed, Hopeless 3 0 3 0 0  PHQ - 2 Score 6 0 6 0 0  Altered sleeping 3  3    Tired,  decreased energy 3  3    Change in appetite 0  3    Feeling bad or failure about yourself  0  1    Trouble concentrating 2  1    Moving slowly or fidgety/restless 0  0    Suicidal thoughts 0  0    PHQ-9 Score 14  17    Difficult doing work/chores Somewhat difficult  Very difficult      Reviewed Care Coordination Services:  Assessed Social Determinants of Health Reviewed all upcoming appointments in Epic system Motivational Interviewing employed Depression screen reviewed  PHQ2/ PHQ9 completed Solution-Focused Strategies employed:  Active listening / Reflection utilized  Emotional Support Provided Problem [place] strategies reviewed Provided general psycho-education for mental health needs  Reviewed mental health medications and discussed importance of compliance:            SDOH assessments and interventions completed:  Yes  SDOH Interventions Today    Flowsheet Row Most Recent Value  SDOH Interventions   Food Insecurity Interventions Intervention Not Indicated  Alcohol Usage Interventions Intervention Not Indicated (Score <7)  Depression Interventions/Treatment  Medication        Care Coordination Interventions:  Yes, provided   Follow up plan: Referral made to [place] Follow up call scheduled for 01/23/22    Encounter Outcome:  Pt. Visit Completed   Eduard Clos MSW, LCSW Licensed Clinical Social Worker [phone]

## 2022-01-15 NOTE — ED Triage Notes (Signed)
Patient brought in by [facility] from home. Patient states she fell asleep in a recliner, she fell out of it and hit her head. Patient alert and oriented, GCS 15 upon arrival. Patient only complaint of headache, mild dizziness. VSS. NAD.

## 2022-01-15 NOTE — Progress Notes (Signed)
Chaplain responded to Level 2.  Nurse said chaplain not needed.  Rev. Tamsen Snider Pager [phone]

## 2022-01-15 NOTE — Patient Instructions (Signed)
Visit Information  Thank you for taking time to visit with me today. Please don't hesitate to contact me if I can be of assistance to you.   Following are the goals we discussed today:   Goals Addressed             This Visit's Progress    Provide support, resources and mental health support       Care Coordination Interventions:  CSW made contact with pt today to discuss "sadness". Pt reports she has "lots to talk about" but nothing specific today. "I can be stubborn". Pt was released from the ER this AM after a fall last night at home. Pt lives alone, does not drive and has a sister, [name], who helps her with rides, getting groceries, RX, etc.  She also has a neighbor who is helpful with getting mail, etc.  Pt acknowledges having depression; PHQ9 depression screening completed today- pt scoring "14" (improved from "17").  Pt denies SI/HI. She is open to considering mental health counseling and is aware Milus Height, LCSW-A, will be providing this in PCP's office later this month.  Pt is also interested in getting information on life alert, [employer] activities in [place] and [place] application assistance. Will ask our [employer] to follow up with pt on these opportunities to enhance pt's overall quality of life/depression.      01/15/2022    1:50 PM 11/07/2021   11:44 AM 09/19/2021   11:55 AM 08/22/2021   11:16 AM 08/01/2021   11:04 AM  Depression screen PHQ 2/9  Decreased Interest 3 0 3 0 0  Down, Depressed, Hopeless 3 0 3 0 0  PHQ - 2 Score 6 0 6 0 0  Altered sleeping 3  3    Tired, decreased energy 3  3    Change in appetite 0  3    Feeling bad or failure about yourself  0  1    Trouble concentrating 2  1    Moving slowly or fidgety/restless 0  0    Suicidal thoughts 0  0    PHQ-9 Score 14  17    Difficult doing work/chores Somewhat difficult  Very difficult      Reviewed Care Coordination Services:  Assessed Social Determinants of Health Reviewed all  upcoming appointments in Epic system Motivational Interviewing employed Depression screen reviewed  PHQ2/ PHQ9 completed Solution-Focused Strategies employed:  Active listening / Reflection utilized  Emotional Support Provided Problem [place] strategies reviewed Provided general psycho-education for mental health needs  Reviewed mental health medications and discussed importance of compliance:            Our next appointment is by telephone on 01/23/22   Please call the care guide team at [phone] if you need to cancel or reschedule your appointment.   If you are experiencing a Mental Health or [place] or need someone to talk to, please call the Suicide and Crisis Lifeline: 988 go to [facility] Urgent [facility] [place] [phone]) call 911      Patient verbalizes understanding of instructions and care plan provided today and agrees to view in [place]. Active MyChart status and patient understanding of how to access instructions and care plan via MyChart confirmed with patient.     Telephone follow up appointment with care management team member scheduled for:  Eduard Clos MSW, LCSW Licensed Clinical Social Worker   [phone]

## 2022-01-15 NOTE — ED Notes (Signed)
Trauma Response Nurse Documentation   [name] is a 71 y.o. female arriving to [facility] via [facility]  On warfarin daily. Trauma was activated as a Level 2 by Isla Pence based on the following trauma criteria Elderly patients > 65 with head trauma on anti-coagulation (excluding ASA). Trauma team at the bedside on patient arrival.   Patient cleared for CT by Dr. Dayna Barker. Pt transported to CT with trauma response nurse present to monitor. RN remained with the patient throughout their absence from the department for clinical observation.   GCS 15.  History   Past Medical History:  Diagnosis Date   Abnormality of lung on CXR 02/02/2020   Nonspecific finding on CXR ordered by pulmonologist - c/w inflammation vs infection, f/u imaging suggested, Dr. [name] office has been in communication about recommended next steps.   Anxiety 07/24/2010   Aortic atherosclerosis ([place]) 10/19/2014   Seen on CT scan, currently asymptomatic   Arteriovenous malformation of gastrointestinal tract 08/08/2015   Non-bleeding when visualized on capsule endoscopy 06/30/2015    Arthritis    "lower back; hands" (02/19/2018)   Asthma    Asymptomatic cholelithiasis 09/25/2015   Seen on CT scan 08/2015   Carotid artery stenosis; s/p R endarterectomy    s/p right endarterectomy (06/2010) Carotid US (07/2010):  Left: Moderate-to-severe (60-79%) calcific and non-calcific plaque origin and proximal ICA and ECA    Chronic congestive heart failure with left ventricular diastolic dysfunction (HCC) 10/21/2010   Chronic constipation 02/03/2011   Chronic daily headache 01/16/2014   Chronic iron deficiency anemia    Chronic low back pain 10/06/2012   Chronic venous insufficiency 08/04/2012   Closed fracture of one rib of left side 08/23/2021   ED visit after a fall 08/16/21    COPD (chronic obstructive pulmonary disease) with emphysema ([place])    PFTs 2018: severe obstructive disease, insignif  response to bronchiodilator, mild restriction parenchymal pattern, moderately severe diffusion defect. 2014  FEV1 0.92 (40%), ratio 69, 27% increase in FEV1 with BD, TLC 91%, severe airtrapping, DLCO49% On chronic home O2. Pulmonary rehab referral 05/2012    Dyspnea    Fibromyalgia 08/29/2010   Gastroesophageal reflux disease    History of blood transfusion    "several times"  (02/19/2018)   History of clear cell renal cell carcinoma ([place]), in remission 07/21/2011   s/p cryoablation of left RCC in 09/2011 by [name]. Followed by [name]  [facility]) .     History of hiatal hernia    History of mitral valve replacement with bioprosthetic valve due to mitral stenosis 2012   s/p MVR with a 27-mm pericardial porcine valve (Medtronic Mosaic valve, serial #[id number] on 09/20/10, Dr. Prescott Gum)    History of obstructive sleep apnea, resolved 2013   resolved per sleep study 07/2019; no apnea, but did have desaturation.  CPAP no longer necessary.  Nocturnal polysomnography (06/2009): Moderate sleep apnea/ hypopnea syndrome , AHI 17.8 per hour with nonpositional hypopneas. CPAP titration to 12 CWP, AHI 2.4 per hour. On nocturnal CPAP via a small resMed Quattro full-face mask with heated humidifier.    History of pneumonia    "once"  (02/19/2018)   History of seborrheic keratosis 09/28/2015   Hyperlipidemia LDL goal < 100 11/20/2005   Internal and external hemorrhoids without complication 44/02/270   Lesion of left native kidney 06/01/2020   Incidental finding on recent screening chest CT for lung ca ordered by Dr. Annamaria Boots pulmonology - he has ordered  a f/u renal U/S.    Lichen sclerosus of female genitalia 01/12/2017   Migraine    "none in years" (02/19/2018)   Moderately severe major depression ([place]) 11/19/2005   Nocturnal hypoxia per sleep study 07/2019    Osteoporosis    DEXA 2016: T -2.7; DEXA (12/09/2011): L-spine T -3.7, left hip T -1.4 DEXA (12/2004): L-spine T -2.6, left hip -0.1     Paroxysmal atrial fibrillation ([place]) 10/22/2010   s/p Left atrial maze procedure for paroxysmal atrial fibrillation on 09/20/2010 by Dr Prescott Gum.  Subsequent splenic infarct, decision was made to re-anticoagulate with coumadin, likely life-long as this is the most likely cause of the splenic infarct.    Personal history of colonic polyps 05/14/2011   Colonoscopy (05/2011): 4 mm adenomatous polyp excised endoscopically Colonoscopy (02/2002): Adenomatous polyp excised endoscopically    Personal history of renal cell carcinoma 09/12/2020   Pneumonia    Pulmonary hypertension due to chronic obstructive pulmonary disease ([place]) 04/25/2016   2014 TEE w PA peak pressure 46 mmHg, s/p MV replacement    Right nephrolithiasis, asymptomatic, incidental finding 09/06/2014   5 mm non-obstructing calculus seen on CT scan 09/05/2014    Right ventricular failure ([place]) 04/25/2016   Severe obesity (BMI 35.0-39.9) with comorbidity ([place]) 10/23/2011   Sleep apnea    Tobacco abuse 07/28/2012   Type 2 diabetes mellitus with diabetic neuropathy [facility])      Past Surgical History:  Procedure Laterality Date   BIOPSY  07/19/2021   Procedure: BIOPSY;  Surgeon: Clarene Essex, MD;  Location: [facility];  Service: Gastroenterology;;   CARDIAC CATHETERIZATION     CARDIAC VALVE REPLACEMENT  Aug. 2012   "mitral valve"   CAROTID ENDARTERECTOMY Right 07/04/2010   by [name] for asymptomatic right carotid artery stenosis   CATARACT EXTRACTION W/ INTRAOCULAR LENS  IMPLANT, BILATERAL Bilateral    CHEST TUBE INSERTION  09/24/2010   Dr Lucianne Lei Trigt   COLONOSCOPY  05/12/2011   performed by Dr. Michail Sermon. Showing small internal hemorrhoids, single tubular adenoma polyp   COLONOSCOPY WITH PROPOFOL N/A 05/29/2020   Procedure: COLONOSCOPY WITH PROPOFOL;  Surgeon: Clarene Essex, MD;  Location: [facility];  Service: Endoscopy;  Laterality: N/A;   COLONOSCOPY WITH PROPOFOL N/A 07/19/2021   Procedure: COLONOSCOPY WITH PROPOFOL;  Surgeon: Clarene Essex, MD;  [facility]: [facility];  Service: Gastroenterology;  Laterality: N/A;   CRYOABLATION Left 09/2011   by [name]. Followed by [name]  [facility]) .     DILATION AND CURETTAGE OF UTERUS     ESOPHAGOGASTRODUODENOSCOPY  05/12/2011   performed by Dr. Michail Sermon. Negative for ulcerations, biopsy negative for evidence of celiac sprue   ESOPHAGOGASTRODUODENOSCOPY N/A 06/29/2015   Procedure: ESOPHAGOGASTRODUODENOSCOPY (EGD);  Surgeon: Clarene Essex, MD;  Location: [facility] ENDOSCOPY;  Service: Endoscopy;  Laterality: N/A;   ESOPHAGOGASTRODUODENOSCOPY N/A 03/29/2016   Procedure: ESOPHAGOGASTRODUODENOSCOPY (EGD);  Surgeon: Clarene Essex, MD;  Location[facility] ENDOSCOPY;  Service: Endoscopy;  Laterality: N/A;   ESOPHAGOGASTRODUODENOSCOPY (EGD) WITH PROPOFOL N/A 05/29/2020   Procedure: ESOPHAGOGASTRODUODENOSCOPY (EGD) WITH PROPOFOL;  Surgeon: Clarene Essex, MD;  [facility];  Service: Endoscopy;  Laterality: N/A;   FRACTURE SURGERY     GIVENS CAPSULE STUDY N/A 06/30/2015   Procedure: GIVENS CAPSULE STUDY;  Surgeon: Clarene Essex, MD;  Location: [place];  Service: Endoscopy;  Laterality: N/A;   GIVENS CAPSULE STUDY N/A 06/29/2015   Procedure: GIVENS CAPSULE STUDY;  Surgeon: Clarene Essex, MD;  Location: [place];  Service: Endoscopy;  Laterality: N/A;  HEMORRHOID SURGERY  1970s?   "lanced"   HYSTEROSCOPY W/ ENDOMETRIAL ABLATION  06/2001   for persistent post-menopausal bleeding // by S. [name], M.D.   IR GENERIC HISTORICAL  08/23/2015   IR RADIOLOGIST EVAL & MGMT 08/23/2015 Aletta Edouard, MD [facility] INTERV RAD   IR GENERIC HISTORICAL  04/09/2016   IR RADIOLOGIST EVAL & MGMT 04/09/2016 Aletta Edouard, MD [facility] INTERV RAD   IR RADIOLOGIST EVAL & MGMT  10/07/2016   IR RADIOLOGIST EVAL & MGMT  06/25/2017   IR RADIOLOGIST EVAL & MGMT  07/31/2020   IR RADIOLOGIST EVAL & MGMT  10/02/2020   IR RADIOLOGIST EVAL & MGMT  12/20/2020   IR RADIOLOGIST EVAL & MGMT  12/25/2021   LEFT HEART CATH AND  CORONARY ANGIOGRAPHY N/A 04/21/2016   Procedure: Left Heart Cath and Coronary Angiography;  Surgeon: Lorretta Harp, MD;  Location: [place] CV LAB;  Service: Cardiovascular;  Laterality: N/A;   LIPOMA EXCISION  08/2005   occipital lipoma 1.5cm - by Dr. [name]   LITHOTRIPSY  ~ 2000   MAZE Left 09/20/10   for paroxysmal atrial fibrillation (Dr. Prescott Gum)   MITRAL VALVE REPLACEMENT  09/20/10    with a 27-mm pericardial porcine valve (Medtronic Mosaic valve, serial #[id number]). 09/20/10, Dr Prescott Gum   ORIF CLAVICLE FRACTURE Right 01/2004   by Thana Farr. [name], M.D for Right clavicle nonunion.; "it's got a pin in it"   POLYPECTOMY  05/29/2020   Procedure: POLYPECTOMY;  Surgeon: Clarene Essex, MD;  [facility];  Service: Endoscopy;;   POLYPECTOMY  07/19/2021   Procedure: POLYPECTOMY;  Surgeon: Clarene Essex, MD;  [facility];  Service: Gastroenterology;;   RADIOLOGY WITH ANESTHESIA Left 09/12/2020   Procedure: RADIOLOGY WITH ANESTHESIA- RENAL CRYOABLATION;  Surgeon: Aletta Edouard, MD;  [facility];  Service: Radiology;  Laterality: Left;   REFRACTIVE SURGERY Bilateral    RIGHT HEART CATH N/A 04/23/2016   Procedure: Right Heart Cath;  Surgeon: [name], MD;  Location: [place] [facility];  Service: Cardiovascular;  Laterality: N/A;   TONSILLECTOMY     TUBAL LIGATION         Initial Focused Assessment (If applicable, or please see trauma documentation): Airway-- intact, no visible obstruction Breathing-- spontaneous, unlabored Circulation-- no apparent bleeding noted  CT's Completed:   CT Head and CT C-Spine   Interventions:  See event summary  Plan for disposition:  Unknown at this time.  Consults completed:  none at 0519.  Event Summary: Patient brought in by [facility], complaint of a fall this morning. Patient fell asleep in recliner, patient fell out and struck her head. Patient arrives, alert and oriented x4, GCS 15. Patient complaining of  head pain and dizziness. 20 G PIV left forearm established. Labs obtained. CT head, c-spine completed.  Bedside handoff with ED RN Quentin Angst.    Trudee Kuster  Trauma Response RN  Please call TRN at [phone] for further assistance.

## 2022-01-16 ENCOUNTER — Ambulatory Visit (INDEPENDENT_AMBULATORY_CARE_PROVIDER_SITE_OTHER): Payer: HMO | Admitting: Internal Medicine

## 2022-01-16 ENCOUNTER — Telehealth: Payer: Self-pay | Admitting: *Deleted

## 2022-01-16 ENCOUNTER — Emergency Department (HOSPITAL_COMMUNITY): Payer: HMO

## 2022-01-16 ENCOUNTER — Inpatient Hospital Stay (HOSPITAL_COMMUNITY)
Admission: EM | Admit: 2022-01-16 | Discharge: 2022-01-19 | DRG: 065 | Disposition: A | Discharge status: Home / Self Care | Payer: HMO | Source: Ambulatory Visit | Attending: Internal Medicine | Admitting: Internal Medicine

## 2022-01-16 ENCOUNTER — Encounter (HOSPITAL_COMMUNITY): Payer: Self-pay

## 2022-01-16 VITALS — BP 170/49 | HR 81 | Temp 97.6°F | Ht 62.0 in | Wt 161.4 lb

## 2022-01-16 DIAGNOSIS — J4489 Other specified chronic obstructive pulmonary disease: Secondary | ICD-10-CM | POA: Diagnosis present

## 2022-01-16 DIAGNOSIS — J449 Chronic obstructive pulmonary disease, unspecified: Secondary | ICD-10-CM | POA: Diagnosis present

## 2022-01-16 DIAGNOSIS — F329 Major depressive disorder, single episode, unspecified: Secondary | ICD-10-CM | POA: Diagnosis present

## 2022-01-16 DIAGNOSIS — I130 Hypertensive heart and chronic kidney disease with heart failure and stage 1 through stage 4 chronic kidney disease, or unspecified chronic kidney disease: Secondary | ICD-10-CM | POA: Diagnosis present

## 2022-01-16 DIAGNOSIS — E1140 Type 2 diabetes mellitus with diabetic neuropathy, unspecified: Secondary | ICD-10-CM

## 2022-01-16 DIAGNOSIS — R296 Repeated falls: Secondary | ICD-10-CM | POA: Diagnosis present

## 2022-01-16 DIAGNOSIS — Z8249 Family history of ischemic heart disease and other diseases of the circulatory system: Secondary | ICD-10-CM

## 2022-01-16 DIAGNOSIS — Z1152 Encounter for screening for COVID-19: Secondary | ICD-10-CM

## 2022-01-16 DIAGNOSIS — G4700 Insomnia, unspecified: Secondary | ICD-10-CM | POA: Diagnosis present

## 2022-01-16 DIAGNOSIS — Z7951 Long term (current) use of inhaled steroids: Secondary | ICD-10-CM

## 2022-01-16 DIAGNOSIS — Z794 Long term (current) use of insulin: Secondary | ICD-10-CM

## 2022-01-16 DIAGNOSIS — I5082 Biventricular heart failure: Secondary | ICD-10-CM | POA: Diagnosis present

## 2022-01-16 DIAGNOSIS — F419 Anxiety disorder, unspecified: Secondary | ICD-10-CM | POA: Diagnosis present

## 2022-01-16 DIAGNOSIS — D500 Iron deficiency anemia secondary to blood loss (chronic): Secondary | ICD-10-CM | POA: Diagnosis present

## 2022-01-16 DIAGNOSIS — I5033 Acute on chronic diastolic (congestive) heart failure: Secondary | ICD-10-CM | POA: Diagnosis present

## 2022-01-16 DIAGNOSIS — M810 Age-related osteoporosis without current pathological fracture: Secondary | ICD-10-CM | POA: Diagnosis present

## 2022-01-16 DIAGNOSIS — I480 Paroxysmal atrial fibrillation: Secondary | ICD-10-CM | POA: Diagnosis present

## 2022-01-16 DIAGNOSIS — G4733 Obstructive sleep apnea (adult) (pediatric): Secondary | ICD-10-CM | POA: Diagnosis present

## 2022-01-16 DIAGNOSIS — Z8673 Personal history of transient ischemic attack (TIA), and cerebral infarction without residual deficits: Secondary | ICD-10-CM | POA: Diagnosis present

## 2022-01-16 DIAGNOSIS — N1831 Chronic kidney disease, stage 3a: Secondary | ICD-10-CM | POA: Diagnosis present

## 2022-01-16 DIAGNOSIS — Z7985 Long-term (current) use of injectable non-insulin antidiabetic drugs: Secondary | ICD-10-CM

## 2022-01-16 DIAGNOSIS — R29700 NIHSS score 0: Secondary | ICD-10-CM | POA: Diagnosis present

## 2022-01-16 DIAGNOSIS — I63521 Cerebral infarction due to unspecified occlusion or stenosis of right anterior cerebral artery: Secondary | ICD-10-CM | POA: Diagnosis not present

## 2022-01-16 DIAGNOSIS — H8110 Benign paroxysmal vertigo, unspecified ear: Secondary | ICD-10-CM | POA: Diagnosis present

## 2022-01-16 DIAGNOSIS — I639 Cerebral infarction, unspecified: Secondary | ICD-10-CM

## 2022-01-16 DIAGNOSIS — Z9981 Dependence on supplemental oxygen: Secondary | ICD-10-CM

## 2022-01-16 DIAGNOSIS — F17210 Nicotine dependence, cigarettes, uncomplicated: Secondary | ICD-10-CM | POA: Diagnosis present

## 2022-01-16 DIAGNOSIS — R5383 Other fatigue: Secondary | ICD-10-CM

## 2022-01-16 DIAGNOSIS — Z7901 Long term (current) use of anticoagulants: Secondary | ICD-10-CM

## 2022-01-16 DIAGNOSIS — Z79899 Other long term (current) drug therapy: Secondary | ICD-10-CM

## 2022-01-16 DIAGNOSIS — Z888 Allergy status to other drugs, medicaments and biological substances status: Secondary | ICD-10-CM

## 2022-01-16 DIAGNOSIS — E1142 Type 2 diabetes mellitus with diabetic polyneuropathy: Secondary | ICD-10-CM | POA: Diagnosis present

## 2022-01-16 DIAGNOSIS — E1169 Type 2 diabetes mellitus with other specified complication: Secondary | ICD-10-CM | POA: Diagnosis present

## 2022-01-16 DIAGNOSIS — E118 Type 2 diabetes mellitus with unspecified complications: Secondary | ICD-10-CM | POA: Diagnosis present

## 2022-01-16 DIAGNOSIS — K219 Gastro-esophageal reflux disease without esophagitis: Secondary | ICD-10-CM | POA: Diagnosis present

## 2022-01-16 DIAGNOSIS — G8929 Other chronic pain: Secondary | ICD-10-CM | POA: Diagnosis present

## 2022-01-16 DIAGNOSIS — I509 Heart failure, unspecified: Secondary | ICD-10-CM | POA: Diagnosis present

## 2022-01-16 DIAGNOSIS — I2729 Other secondary pulmonary hypertension: Secondary | ICD-10-CM | POA: Diagnosis present

## 2022-01-16 DIAGNOSIS — Z79891 Long term (current) use of opiate analgesic: Secondary | ICD-10-CM

## 2022-01-16 DIAGNOSIS — D509 Iron deficiency anemia, unspecified: Secondary | ICD-10-CM | POA: Diagnosis present

## 2022-01-16 DIAGNOSIS — Z85528 Personal history of other malignant neoplasm of kidney: Secondary | ICD-10-CM

## 2022-01-16 DIAGNOSIS — R42 Dizziness and giddiness: Secondary | ICD-10-CM

## 2022-01-16 DIAGNOSIS — I5032 Chronic diastolic (congestive) heart failure: Secondary | ICD-10-CM | POA: Diagnosis present

## 2022-01-16 DIAGNOSIS — E785 Hyperlipidemia, unspecified: Secondary | ICD-10-CM | POA: Diagnosis present

## 2022-01-16 DIAGNOSIS — Z953 Presence of xenogenic heart valve: Secondary | ICD-10-CM

## 2022-01-16 DIAGNOSIS — Z885 Allergy status to narcotic agent status: Secondary | ICD-10-CM

## 2022-01-16 DIAGNOSIS — J439 Emphysema, unspecified: Secondary | ICD-10-CM | POA: Diagnosis present

## 2022-01-16 DIAGNOSIS — M797 Fibromyalgia: Secondary | ICD-10-CM | POA: Diagnosis present

## 2022-01-16 DIAGNOSIS — E1122 Type 2 diabetes mellitus with diabetic chronic kidney disease: Secondary | ICD-10-CM | POA: Diagnosis present

## 2022-01-16 LAB — CBC WITH DIFFERENTIAL/PLATELET
Abs Immature Granulocytes: 0.03 10*3/uL (ref 0.00–0.07)
Basophils Absolute: 0.1 10*3/uL (ref 0.0–0.1)
Basophils Relative: 1 %
Eosinophils Absolute: 0.1 10*3/uL (ref 0.0–0.5)
Eosinophils Relative: 1 %
HCT: 31.4 % — ABNORMAL LOW (ref 36.0–46.0)
Hemoglobin: 8.3 g/dL — ABNORMAL LOW (ref 12.0–15.0)
Immature Granulocytes: 0 %
Lymphocytes Relative: 15 %
Lymphs Abs: 1.3 10*3/uL (ref 0.7–4.0)
MCH: 20.6 pg — ABNORMAL LOW (ref 26.0–34.0)
MCHC: 26.4 g/dL — ABNORMAL LOW (ref 30.0–36.0)
MCV: 78.1 fL — ABNORMAL LOW (ref 80.0–100.0)
Monocytes Absolute: 0.8 10*3/uL (ref 0.1–1.0)
Monocytes Relative: 9 %
Neutro Abs: 6.8 10*3/uL (ref 1.7–7.7)
Neutrophils Relative %: 74 %
Platelets: 278 10*3/uL (ref 150–400)
RBC: 4.02 MIL/uL (ref 3.87–5.11)
RDW: 18.6 % — ABNORMAL HIGH (ref 11.5–15.5)
WBC: 9.1 10*3/uL (ref 4.0–10.5)
nRBC: 0.2 % (ref 0.0–0.2)

## 2022-01-16 LAB — COMPREHENSIVE METABOLIC PANEL
ALT: 13 U/L (ref 0–44)
AST: 21 U/L (ref 15–41)
Albumin: 3.5 g/dL (ref 3.5–5.0)
Alkaline Phosphatase: 50 U/L (ref 38–126)
Anion gap: 9 (ref 5–15)
BUN: 14 mg/dL (ref 8–23)
CO2: 22 mmol/L (ref 22–32)
Calcium: 9.2 mg/dL (ref 8.9–10.3)
Chloride: 107 mmol/L (ref 98–111)
Creatinine, Ser: 1.06 mg/dL — ABNORMAL HIGH (ref 0.44–1.00)
GFR, Estimated: 56 mL/min — ABNORMAL LOW (ref >60)
Glucose, Bld: 155 mg/dL — ABNORMAL HIGH (ref 70–99)
Potassium: 4.9 mmol/L (ref 3.5–5.1)
Sodium: 138 mmol/L (ref 135–145)
Total Bilirubin: 0.4 mg/dL (ref 0.3–1.2)
Total Protein: 6.5 g/dL (ref 6.5–8.1)

## 2022-01-16 LAB — URINALYSIS, ROUTINE W REFLEX MICROSCOPIC
Bilirubin Urine: NEGATIVE
Glucose, UA: NEGATIVE mg/dL
Hgb urine dipstick: NEGATIVE
Ketones, ur: NEGATIVE mg/dL
Nitrite: NEGATIVE
Protein, ur: NEGATIVE mg/dL
Specific Gravity, Urine: 1.01 (ref 1.005–1.030)
pH: 5 (ref 5.0–8.0)

## 2022-01-16 LAB — PROTIME-INR
INR: 1.4 — ABNORMAL HIGH (ref 0.8–1.2)
Prothrombin Time: 17.2 seconds — ABNORMAL HIGH (ref 11.4–15.2)

## 2022-01-16 LAB — TROPONIN I (HIGH SENSITIVITY)
Troponin I (High Sensitivity): 9 ng/L (ref <18)
Troponin I (High Sensitivity): 8 ng/L (ref <18)

## 2022-01-16 LAB — PREPARE RBC (CROSSMATCH)

## 2022-01-16 LAB — POCT GLYCOSYLATED HEMOGLOBIN (HGB A1C): Hemoglobin A1C: 7.1 % — AB (ref 4.0–5.6)

## 2022-01-16 LAB — GLUCOSE, CAPILLARY: Glucose-Capillary: 182 mg/dL — ABNORMAL HIGH (ref 70–99)

## 2022-01-16 LAB — RESP PANEL BY RT-PCR (FLU A&B, COVID) ARPGX2
Influenza A by PCR: NEGATIVE
Influenza B by PCR: NEGATIVE
SARS Coronavirus 2 by RT PCR: NEGATIVE

## 2022-01-16 MED ORDER — ROSUVASTATIN CALCIUM 20 MG PO TABS
20.0000 mg | ORAL_TABLET | Freq: Every day | ORAL | Status: DC
  Administered 2022-01-17 – 2022-01-19 (×3): 20 mg via ORAL
  Filled 2022-01-16 (×3): qty 1

## 2022-01-16 MED ORDER — HYDROCODONE-ACETAMINOPHEN 5-325 MG PO TABS
1.0000 | ORAL_TABLET | Freq: Three times a day (TID) | ORAL | Status: DC | PRN
  Administered 2022-01-16 – 2022-01-19 (×6): 2 via ORAL
  Administered 2022-01-19: 1 via ORAL
  Filled 2022-01-16 (×2): qty 1
  Filled 2022-01-16 (×3): qty 2
  Filled 2022-01-16 (×2): qty 1
  Filled 2022-01-16: qty 2
  Filled 2022-01-16: qty 1

## 2022-01-16 MED ORDER — IPRATROPIUM-ALBUTEROL 0.5-2.5 (3) MG/3ML IN SOLN: 3.0000 mL | RESPIRATORY_TRACT | Status: DC | PRN

## 2022-01-16 MED ORDER — WARFARIN - PHARMACIST DOSING INPATIENT: Freq: Every day | Status: DC

## 2022-01-16 MED ORDER — FLUTICASONE PROPIONATE 50 MCG/ACT NA SUSP
2.0000 | Freq: Every day | NASAL | Status: DC
  Administered 2022-01-17: 2 via NASAL
  Filled 2022-01-16 (×4): qty 16

## 2022-01-16 MED ORDER — WARFARIN SODIUM 2.5 MG PO TABS
2.5000 mg | ORAL_TABLET | Freq: Once | ORAL | Status: AC
  Administered 2022-01-17: 2.5 mg via ORAL
  Filled 2022-01-16 (×2): qty 1

## 2022-01-16 MED ORDER — UMECLIDINIUM BROMIDE 62.5 MCG/ACT IN AEPB
1.0000 | INHALATION_SPRAY | Freq: Every day | RESPIRATORY_TRACT | Status: DC
  Filled 2022-01-16 (×2): qty 7

## 2022-01-16 MED ORDER — GABAPENTIN 100 MG PO CAPS
600.0000 mg | ORAL_CAPSULE | Freq: Every day | ORAL | Status: DC
  Administered 2022-01-16 – 2022-01-18 (×3): 600 mg via ORAL
  Filled 2022-01-16 (×3): qty 6

## 2022-01-16 MED ORDER — ALPRAZOLAM 0.5 MG PO TABS
0.5000 mg | ORAL_TABLET | Freq: Every evening | ORAL | Status: DC | PRN
  Administered 2022-01-17 – 2022-01-19 (×4): 0.5 mg via ORAL
  Filled 2022-01-16: qty 2
  Filled 2022-01-16 (×3): qty 1

## 2022-01-16 MED ORDER — METOPROLOL SUCCINATE ER 50 MG PO TB24
50.0000 mg | ORAL_TABLET | Freq: Every day | ORAL | Status: DC
  Administered 2022-01-17 – 2022-01-19 (×3): 50 mg via ORAL
  Filled 2022-01-16: qty 2
  Filled 2022-01-16 (×2): qty 1

## 2022-01-16 MED ORDER — LORATADINE 10 MG PO TABS
10.0000 mg | ORAL_TABLET | Freq: Every day | ORAL | Status: DC
  Administered 2022-01-17 – 2022-01-19 (×3): 10 mg via ORAL
  Filled 2022-01-16 (×3): qty 1

## 2022-01-16 MED ORDER — SODIUM CHLORIDE 0.9% IV SOLUTION: Freq: Once | INTRAVENOUS | Status: AC

## 2022-01-16 MED ORDER — DULOXETINE HCL 60 MG PO CPEP
60.0000 mg | ORAL_CAPSULE | Freq: Two times a day (BID) | ORAL | Status: DC
  Administered 2022-01-16 – 2022-01-19 (×6): 60 mg via ORAL
  Filled 2022-01-16 (×3): qty 1
  Filled 2022-01-16 (×2): qty 2
  Filled 2022-01-16: qty 1

## 2022-01-16 MED ORDER — MOMETASONE FURO-FORMOTEROL FUM 200-5 MCG/ACT IN AERO
2.0000 | INHALATION_SPRAY | Freq: Two times a day (BID) | RESPIRATORY_TRACT | Status: DC
  Filled 2022-01-16 (×2): qty 8.8

## 2022-01-16 MED ORDER — ONDANSETRON HCL 4 MG/2ML IJ SOLN
4.0000 mg | Freq: Four times a day (QID) | INTRAMUSCULAR | Status: DC | PRN
  Administered 2022-01-16 – 2022-01-19 (×6): 4 mg via INTRAVENOUS
  Filled 2022-01-16 (×7): qty 2

## 2022-01-16 MED ORDER — BUPROPION HCL 75 MG PO TABS
150.0000 mg | ORAL_TABLET | Freq: Every day | ORAL | Status: DC
  Administered 2022-01-17 – 2022-01-19 (×3): 150 mg via ORAL
  Filled 2022-01-16 (×5): qty 2

## 2022-01-16 MED ORDER — GABAPENTIN 100 MG PO CAPS
300.0000 mg | ORAL_CAPSULE | Freq: Every day | ORAL | Status: DC
  Administered 2022-01-17 – 2022-01-19 (×3): 300 mg via ORAL
  Filled 2022-01-16 (×3): qty 3

## 2022-01-16 MED ORDER — PANTOPRAZOLE SODIUM 40 MG PO TBEC
40.0000 mg | DELAYED_RELEASE_TABLET | Freq: Two times a day (BID) | ORAL | Status: DC
  Administered 2022-01-16 – 2022-01-19 (×6): 40 mg via ORAL
  Filled 2022-01-16 (×6): qty 1

## 2022-01-16 MED ORDER — NICOTINE 14 MG/24HR TD PT24
14.0000 mg | MEDICATED_PATCH | Freq: Every day | TRANSDERMAL | Status: DC
  Administered 2022-01-16 – 2022-01-19 (×4): 14 mg via TRANSDERMAL
  Filled 2022-01-16 (×4): qty 1

## 2022-01-16 NOTE — Telephone Encounter (Signed)
Telephone encounter was:  Successful.  01/16/2022 Name: [name] MRN: [id number] DOB: 27-Jan-1951  [name] is a 71 y.o. year old female who is a primary care patient of Angelica Pou, MD . The community resource team was consulted for assistance with Home Modifications  Care guide performed the following interventions: Patient provided with information about care guide support team and interviewed to confirm resource needs.  Follow Up Plan: Will reach out in a few days   [place] [address] , [place] [address] Email : [email or web address]

## 2022-01-16 NOTE — ED Provider Notes (Addendum)
[facility] EMERGENCY DEPARTMENT Provider Note   CSN: [id number] Arrival date & time: 01/16/22  1227     History  Chief Complaint  Patient presents with   Anemia    [name] is a 71 y.o. female.  Patient is here after being sent over from her PCP office for symptomatic anemia.  History obtained from both patient and her PCP.  Patient has a longstanding history of iron deficiency anemia that has been challenging to manage as she had an allergic reaction to IV iron.  She has had a fairly extensive workup in the past with both her PCP and her GI doctor (is a patient of [facility]).  She is on chronic Coumadin for atrial fibrillation.  She reports good adherence to her Coumadin.  Other past medical history is significant for well-controlled diabetes, last A1c 7.1.  COPD, chronic pain on opioid therapy, PAD, HFpEF, and depression. She reports ongoing dizziness for several weeks now.  She was seen in the ED 2 days ago after a fall in which she hit her head.  CT scan of the head at that time was negative for acute intracranial bleed but she was noted to have a hemoglobin of 8.0, down from a baseline of 11.2 about 5 months ago.  She reports that she was sleeping in her recliner and fell forward in her sleep falling out of the recliner and hitting her head on the bed frame in front of her recliner sustaining an injury.  Her dizziness has been worse since this fall and is quite bothersome to her today.  Per PCPs note from today it sounds like she also has fallen twice more at home since this initial episode.  She will become acutely dizzy upon leaning forward even in her recliner here in the hospital bed.  She describes her dizziness as both the room spinning and her feeling lightheaded.  She denies any recent blood in her stool. No dysuria, flank pain, or increased urinary frequency.     Anemia Associated symptoms include chest pain and headaches. Pertinent negatives  include no shortness of breath (no more than her baseline).         Home Medications Prior to Admission medications   Medication Sig Start Date End Date Taking? Authorizing Provider  albuterol (PROAIR HFA) 108 (90 Base) MCG/ACT inhaler Inhale 2 puffs into the lungs every 6 (six) hours as needed for shortness of breath. 09/17/20   Sid Falcon, MD  ALPRAZolam [name]) 1 MG tablet Take 1/2 - 1 tablet (1/2  - 1 mg total) by mouth at bedtime as needed for anxiety or sleep. May take 1/2 tablet during the day if needed for anxiety 12/10/21   Angelica Pou, MD  Bacillus Coagulans-Inulin (ALIGN PREBIOTIC-PROBIOTIC PO) Take 1 capsule by mouth daily.    [provider]  benzonatate (TESSALON) 100 MG capsule Take 1 capsule (100 mg total) by mouth 3 (three) times daily as needed for cough. 08/02/20   Angelica Pou, MD  Blood Glucose Monitoring Suppl [facility] VERIO REFLECT) w/Device KIT Use to check blood sugar 1 time a day 05/29/21   [name], MD  buPROPion (WELLBUTRIN XL) 150 MG 24 hr tablet Take 1 tablet (150 mg total) by mouth every morning. 12/23/21   Angelica Pou, MD  cetirizine (ZYRTEC ALLERGY) 10 MG tablet Take 1 tablet (10 mg total) by mouth daily. 09/19/21 09/19/22  Angelica Pou, MD  clobetasol ointment (TEMOVATE) 0.05 % Apply  to affected area(s) 2 times daily as needed for (irritation). 09/09/21   Angelica Pou, MD  Continuous Blood Gluc Sensor (FREESTYLE LIBRE 3 SENSOR) MISC Place 1 sensor on the skin every 14 days. Use to check glucose continuously 12/23/21   Angelica Pou, MD  DULoxetine (CYMBALTA) 60 MG capsule Take 1 capsule by mouth twice a day. Patient taking differently: Take 60 mg by mouth 2 (two) times daily. 01/30/21     fluticasone (FLONASE) 50 MCG/ACT nasal spray Place 2 sprays into both nostrils daily. 06/24/21   Angelica Pou, MD  Fluticasone-Umeclidin-Vilant 100-62.5-25 MCG/ACT AEPB Inhale 1 puff into the lungs daily.  01/28/21   Deneise Lever, MD  furosemide (LASIX) 80 MG tablet Take 1 tablet by mouth every morning AND 1/2 tablet daily in the afternoon. Patient taking differently: Take 1 tablet  80 mg by mouth every morning AND 1/2 tablet 40 mg  daily in the afternoon. 09/10/21   [name], MD  gabapentin (NEURONTIN) 300 MG capsule Take 1 capsule in the morning and 2 capsules at night Patient taking differently: Take 300-600 mg by mouth See admin instructions. Take 1 capsule  (300 mg) in the morning and 2 capsules( 600 mg) at night 09/19/21   Angelica Pou, MD  HYDROcodone-acetaminophen (NORCO/VICODIN) 5-325 MG tablet Take 1-2 tablets by mouth 3 (three) times daily. 12/27/21   Angelica Pou, MD  insulin degludec (TRESIBA FLEXTOUCH) 100 UNIT/ML FlexTouch Pen Inject 43 Units into the skin daily. 08/01/21 01/31/22  Angelica Pou, MD  Insulin Pen Needle 32G X 4 MM MISC Use to inject insulin up to 6 times a day 08/04/17   [name], MD  ipratropium-albuterol (DUONEB) 0.5-2.5 (3) MG/3ML SOLN Take 3 mLs by nebulization every 6 (six) hours as needed (shortness of breath, wheezing). 11/12/20   Angelica Pou, MD  ketoconazole (NIZORAL) 2 % cream Apply 1 application topically at bedtime as needed for irritation. 07/17/21   Angelica Pou, MD  Lancets Misc. (ACCU-CHEK FASTCLIX LANCET) KIT Check your blood 4 times a day dx code 250.00 insulin requiring 01/19/13   [name], MD  lidocaine (LIDODERM) 5 % Place 1 or 2 patches to painful area of back each day.  Remove & Discard patch within 12 hours or as directed by MD 12/27/21   Angelica Pou, MD  metoprolol succinate (TOPROL-XL) 50 MG 24 hr tablet Take 1 tablet by mouth daily. Take with or immediately following a meal. 12/23/21   Angelica Pou, MD  nystatin powder Apply topically 3  times daily. Patient taking differently: Apply 1 Application topically 3 (three) times daily as needed (rash). 10/16/21   Angelica Pou, MD  omeprazole (PRILOSEC) 40 MG capsule Take 1 capsule by mouth in the morning and at bedtime. Patient taking differently: Take 40 mg by mouth 2 (two) times daily. 09/19/21   Angelica Pou, MD  ondansetron (ZOFRAN) 4 MG tablet Take 1 tablet (4 mg total) by mouth every 8 (eight) hours as needed for nausea or vomiting. 12/23/21 12/23/22  Angelica Pou, MD  OXYGEN Inhale 2 L into the lungs at bedtime.    [provider]  potassium chloride SA (KLOR-CON M) 20 MEQ tablet Take 2 tablets (40 mEq total) by mouth daily. 08/23/21   [name], MD  rosuvastatin (CRESTOR) 20 MG tablet TAKE 1 TABLET BY MOUTH ONCE EVERY NIGHT AT BEDTIME Patient taking differently: Take 20 mg by mouth at bedtime. 05/14/21  Angelica Pou, MD  Semaglutide,0.25 or 0.5MG/DOS, (OZEMPIC, 0.25 OR 0.5 MG/DOSE,) 2 MG/3ML SOPN Inject 0.5 mg into the skin once a week. Patient taking differently: Inject 0.5 mg into the skin once a week. Monday 06/18/21   Angelica Pou, MD  senna-docusate (SENOKOT S) 8.6-50 MG tablet Take 1 to 2 tablets by mouth once or twice a day as needed for constipation 07/04/21   Angelica Pou, MD  warfarin (COUMADIN) 2.5 MG tablet Take 1 tablet by mouth daily unless otherwise instructed. Patient taking differently: Take 1.25-2.5 mg by mouth See admin instructions. Taking 2.5 mg on Wed, Friday, Sunday, then 1.25 mg on Tuesday, Thursday, Saturday- not taking any on Monday. 02/05/21   Angelica Pou, MD      Allergies    Dexilant [dexlansoprazole], Lorazepam, Oxycontin [oxycodone], and Tramadol hcl    Review of Systems   Review of Systems  Constitutional:  Positive for activity change. Negative for chills and fever.  Respiratory:  Negative for shortness of breath (no more than her baseline).   Cardiovascular:  Positive for chest pain.  Gastrointestinal:  Positive for constipation. Negative for abdominal distention and anal bleeding.  Neurological:  Positive for  dizziness, weakness and headaches.  All other systems reviewed and are negative.   Physical Exam Updated Vital Signs BP (!) 166/57   Pulse 63   Temp 98 F (36.7 C) (Oral)   Resp 18   SpO2 96%  Physical Exam Vitals reviewed.  Constitutional:      General: She is not in acute distress. Eyes:     Pupils: Pupils are equal, round, and reactive to light.  Cardiovascular:     Rate and Rhythm: Normal rate and regular rhythm.     Heart sounds: Murmur (systolic, heard best at LUSB) heard.  Pulmonary:     Effort: Pulmonary effort is normal.     Breath sounds: Normal breath sounds.  Abdominal:     General: There is no distension.     Palpations: Abdomen is soft.     Tenderness: There is no abdominal tenderness.  Musculoskeletal:        General: No swelling.  Skin:    General: Skin is warm and dry.  Neurological:     Mental Status: She is oriented to person, place, and time.     Cranial Nerves: No cranial nerve deficit.     Motor: No weakness.  Psychiatric:        Mood and Affect: Mood normal.     Comments: Somewhat confused with a bit of word-finding difficulty     ED Results / Procedures / Treatments   Labs (all labs ordered are listed, but only abnormal results are displayed) Labs Reviewed  COMPREHENSIVE METABOLIC PANEL - Abnormal; Notable for the following components:      Result Value   Glucose, Bld 155 (*)    Creatinine, Ser 1.06 (*)    GFR, Estimated 56 (*)    All other components within normal limits  CBC WITH DIFFERENTIAL/PLATELET - Abnormal; Notable for the following components:   Hemoglobin 8.3 (*)    HCT 31.4 (*)    MCV 78.1 (*)    MCH 20.6 (*)    MCHC 26.4 (*)    RDW 18.6 (*)    All other components within normal limits  PROTIME-INR - Abnormal; Notable for the following components:   Prothrombin Time 17.2 (*)    INR 1.4 (*)    All other components within normal limits  URINALYSIS, ROUTINE W REFLEX MICROSCOPIC - Abnormal; Notable for the following  components:   APPearance HAZY (*)    Leukocytes,Ua MODERATE (*)    Bacteria, UA MANY (*)    All other components within normal limits  RESP PANEL BY RT-PCR (FLU A&B, COVID) ARPGX2  CBG MONITORING, ED  TYPE AND SCREEN  TROPONIN I (HIGH SENSITIVITY)  TROPONIN I (HIGH SENSITIVITY)    EKG None  Radiology DG Chest 1 View  Result Date: 01/16/2022 CLINICAL DATA:  Chest pain.  Generalized weakness. EXAM: CHEST  1 VIEW COMPARISON:  08/16/2021 FINDINGS: Previous median sternotomy and CABG procedure. Aortic atherosclerosis. Stable mild cardiac enlargement. Slight asymmetric elevation of the left hemidiaphragm. Blunting of the left costophrenic angle is identified which is favored to represent pleuroparenchymal scarring. No pleural fluid or airspace consolidation. Multiple right posterior and lateral rib fracture deformities are identified which appear unchanged from previous exam. IMPRESSION: 1. No acute cardiopulmonary abnormalities. 2. Chronic right posterior and lateral rib fracture deformities. 3. Pleural parenchymal scarring within the left lung base. Electronically Signed   By: Kerby Moors M.D.   On: 01/16/2022 15:08   CT Cervical Spine Wo Contrast  Result Date: 01/15/2022 CLINICAL DATA:  71 year old female status post fall, struck head. Headache, dizziness. EXAM: CT CERVICAL SPINE WITHOUT CONTRAST TECHNIQUE: Multidetector CT imaging of the cervical spine was performed without intravenous contrast. Multiplanar CT image reconstructions were also generated. RADIATION DOSE REDUCTION: This exam was performed according to the departmental dose-optimization program which includes automated exposure control, adjustment of the mA and/or kV according to patient size and/or use of iterative reconstruction technique. COMPARISON:  Cervical spine CT 10/30/2021. FINDINGS: Alignment: Maintained cervical lordosis. Cervicothoracic junction alignment is within normal limits. Bilateral posterior element alignment  is within normal limits. Skull base and vertebrae: Visualized skull base is intact. No atlanto-occipital dissociation. C1 and C2 appear intact and aligned. Intermittent mild motion artifact, most pronounced at C5. No acute osseous abnormality identified. Soft tissues and spinal canal: No prevertebral fluid or swelling. No visible canal hematoma. Stable chronic right neck surgical clips, perhaps from previous endarterectomy. Disc levels: Mild for age cervical spine degeneration and capacious spinal canal redemonstrated. Upper chest: Chronic right clavicle ORIF redemonstrated. Centrilobular emphysema in the lung apices. Calcified great vessel atherosclerosis. Visible upper thoracic vertebrae appear grossly intact. IMPRESSION: 1. Mild motion artifact. No acute traumatic injury identified in the cervical spine. 2.  Emphysema (ICD10-J43.9). Electronically Signed   By: Genevie Ann M.D.   On: 01/15/2022 05:25   CT Head Wo Contrast  Result Date: 01/15/2022 CLINICAL DATA:  71 year old female status post fall, struck head. Headache, dizziness. EXAM: CT HEAD WITHOUT CONTRAST TECHNIQUE: Contiguous axial images were obtained from the base of the skull through the vertex without intravenous contrast. RADIATION DOSE REDUCTION: This exam was performed according to the departmental dose-optimization program which includes automated exposure control, adjustment of the mA and/or kV according to patient size and/or use of iterative reconstruction technique. COMPARISON:  Brain MRI 03/02/2018.  Head CT 10/30/2021. FINDINGS: Brain: Stable cerebral volume. No midline shift, ventriculomegaly, mass effect, evidence of mass lesion, intracranial hemorrhage or evidence of cortically based acute infarction. Gray-white matter differentiation is within normal limits throughout the brain. Match that gray-white differentiation remains within normal limits for age. Basal ganglia vascular calcifications are stable. Vascular: Calcified atherosclerosis  at the skull base. No suspicious intracranial vascular hyperdensity. Skull: Stable, No acute osseous abnormality identified. Sinuses/Orbits: Subtotal opacification of the left maxillary sinus with lobulated mucosal thickening and bubbly  opacity has not significantly changed since September. Other Visualized paranasal sinuses and mastoids are clear. Other: No convincing orbit or scalp soft tissue injury. IMPRESSION: 1. No acute traumatic injury identified. Stable and negative for age noncontrast CT appearance of the brain. 2. Stable left maxillary sinusitis. Electronically Signed   By: Genevie Ann M.D.   On: 01/15/2022 05:22    Procedures Procedures   Medications Ordered in ED Medications - No data to display  ED Course/ Medical Decision Making/ A&P                           Medical Decision Making Patient here for evaluation of dizziness to be due to symptomatic anemia by her PCP.  Taken together, her history and the marked drop in her hemoglobin (>3 points in 5 months) is certainly consistent with this picture.  Had a normal for age CT of the head 2 nights ago after her fall at home.  Could consider advanced head imaging with an MRI, though given that she is in internal medicine teaching service patient patient and her PCP's intention to admit, will defer further imaging to the admitting team. Considered infectious etiology, though CXR negative. Leuks and bacteria on UA though no urinary symptoms so suspect this represents asymptomatic bacteruria.  Note also that she has a faint murmur that seems to be new per chart review.  Suspect that this may be simply a manifestation of her anemia, however if it persists after transfusion, could warrant further workup for cardiac cause of her dizziness. Discussed with Dr Collene Gobble, IM resident on for the evening who will see for admission.   Amount and/or Complexity of Data Reviewed Labs: ordered.  Risk Decision regarding hospitalization.   Final Clinical  Impression(s) / ED Diagnoses Final diagnoses:  Fatigue, unspecified type    Rx / DC Orders ED Discharge Orders     None      Pearla Dubonnet, MD    Eppie Gibson, MD 01/16/22 Patrecia Pour    Eppie Gibson, MD 01/16/22 2001    Tegeler, Gwenyth Allegra, MD 01/16/22 2316

## 2022-01-16 NOTE — Progress Notes (Signed)
[place] for Warfarin Indication: atrial fibrillation  Allergies  Allergen Reactions   Dexilant [Dexlansoprazole] Other (See Comments)    Made acid reflux worse   Lorazepam Other (See Comments)    Patient's sister noted that ativan caused the patient to become extremely confused during hospitalization 09/2010; tolerates Xanax   Oxycontin [Oxycodone] Other (See Comments)    headache   Tramadol Hcl Swelling    Ankle swelling    Patient Measurements:    Vital Signs: Temp: 98 F (36.7 C) (12/07 1646) Temp Source: Oral (12/07 1646) BP: 183/49 (12/07 2115) Pulse Rate: 65 (12/07 2115)  Labs: Recent Labs    01/15/22 0453 01/16/22 1419 01/16/22 1434 01/16/22 1832  HGB 8.0* 8.3*  --   --   HCT 28.2* 31.4*  --   --   PLT 214 278  --   --   LABPROT 18.6* 17.2*  --   --   INR 1.6* 1.4*  --   --   CREATININE 1.29*  --  1.06*  --   TROPONINIHS  --   --  9 8    Estimated Creatinine Clearance: 44.9 mL/min (A) (by C-G formula based on SCr of 1.06 mg/dL (H)).   Medications:  Scheduled:   sodium chloride   Intravenous Once   [START ON 01/17/2022] buPROPion  150 mg Oral Daily   DULoxetine  60 mg Oral BID   [START ON 01/17/2022] fluticasone  2 spray Each Nare Daily   gabapentin  600 mg Oral QHS   And   [START ON 01/17/2022] gabapentin  300 mg Oral Daily   [START ON 01/17/2022] loratadine  10 mg Oral Daily   [START ON 01/17/2022] metoprolol succinate  50 mg Oral Daily   mometasone-formoterol  2 puff Inhalation BID   nicotine  14 mg Transdermal Daily   [START ON 01/17/2022] rosuvastatin  20 mg Oral Daily   [START ON 01/17/2022] umeclidinium bromide  1 puff Inhalation Daily   Infusions:   Assessment: 68 YOF presenting for symptomatic anemia with history of long-standing iron deficiency anemia. On warfarin PTA for atrial fibrillation. Hgb 8.3 and patient received a blood transfusion. Clarified with admitting - okay to restart warfarin. Last dose  12/6 _0   PTA regimen: 2.29m Wed/Fri/Sun; 1.232mTu/Thur/Sun, no dose on Mondays  INR 1.4 which is subtherapeutic. No signs of bleeding reported.  Goal of Therapy:  INR 2-3 Monitor platelets by anticoagulation protocol: Yes   Plan:  Warfarin 2.5 mg PO once (increased dose from PTA due to subtherapeutic INR) Daily INR and monitoring for signs/symptoms of bleeding  LoMerrilee JanskyPharmD Clinical Pharmacist 01/16/2022,9:36 PM

## 2022-01-16 NOTE — ED Notes (Signed)
Pt reassessed by this RN, pt in NAD at this time. AxOx4.

## 2022-01-16 NOTE — H&P (Signed)
Date: 01/16/2022               Patient Name:  [name] MRN: [id number]  DOB: 12-24-1950 Age / Sex: 71 y.o., female   PCP: Miguel Aschoff, MD         Medical Service: Internal Medicine Teaching Service         Attending Physician: Dr. Gust Rung, DO    First Contact: Dr. Rana Snare, DO  Pager: [phone]  Second Contact: Dr. Thalia Bloodgood, DO  Pager: [phone]       After Hours (After 5p/  First Contact Pager: [phone]  weekends / holidays): Second Contact Pager: [phone]   Chief Complaint: dizziness  History of Present Illness: [name] is a 71 y/o woman with a pmh of multiple falls,worsening of her recurrent iron deficient anemia,  T2DM c/b neuropathy,CKD3a,COPD ,chronic multifactorial generalized pain , PAD, HFpEF , MDD who is directly admitted to the hospital per Dr. [name] for iron deficiency anemia with 2 days of dizziness and multiple falls.Patient says she fell out of her chair while asleep and hit R side of her head on the bed night of 12/5. Seen in ED and had negative head and c spine imaging. Says since then she has this new dizziness related to positional changes, occurring with leaning forward,lying down and standing up. She says the room spins for 30 seconds to 1 minute and she gets nauseated. Also says she has had whooshing in her ears the past two weeks. She says during the episodes of dizziness she is not really able to tell if she has headache, vision changes, CP, SOB but if so it is not overtly so. She denies recent hearing loss. She says she has had 1 off dizzinesss in the past but this has been happening multiple times over the past two days and has not been this quality in the past. She also says when anemic in the past nothing like this has happened. Denies any sick contacts, new cough, congestion, runny nose, sore throat. Meds:   Current Meds  Medication Sig   albuterol (PROAIR HFA) 108 (90 Base) MCG/ACT inhaler Inhale 2 puffs into  the lungs every 6 (six) hours as needed for shortness of breath.   ALPRAZolam (XANAX) 1 MG tablet Take 1/2 - 1 tablet (1/2  - 1 mg total) by mouth at bedtime as needed for anxiety or sleep. May take 1/2 tablet during the day if needed for anxiety   Bacillus Coagulans-Inulin (ALIGN PREBIOTIC-PROBIOTIC PO) Take 1 capsule by mouth daily.   buPROPion (WELLBUTRIN XL) 150 MG 24 hr tablet Take 1 tablet (150 mg total) by mouth every morning.   cetirizine (ZYRTEC ALLERGY) 10 MG tablet Take 1 tablet (10 mg total) by mouth daily.   clobetasol ointment (TEMOVATE) 0.05 % Apply to affected area(s) 2 times daily as needed for (irritation).   DULoxetine (CYMBALTA) 60 MG capsule Take 1 capsule by mouth twice a day. (Patient taking differently: Take 60 mg by mouth 2 (two) times daily.)   fluticasone (FLONASE) 50 MCG/ACT nasal spray Place 2 sprays into both nostrils daily. (Patient taking differently: Place 2 sprays into both nostrils at bedtime.)   Fluticasone-Umeclidin-Vilant 100-62.5-25 MCG/ACT AEPB Inhale 1 puff into the lungs daily.   furosemide (LASIX) 80 MG tablet Take 1 tablet by mouth every morning AND 1/2 tablet daily in the afternoon. (Patient taking differently: Take 1 tablet  80 mg by mouth every morning AND 1/2 tablet 40 mg  daily in the afternoon.)   gabapentin (NEURONTIN) 300 MG capsule Take 1 capsule in the morning and 2 capsules at night (Patient taking differently: Take 600 mg by mouth See admin instructions. Take 2 capsules  (600 mg) in the afternnon and 2 capsules( 600 mg) at night)   HYDROcodone-acetaminophen (NORCO/VICODIN) 5-325 MG tablet Take 1-2 tablets by mouth 3 (three) times daily.   insulin degludec (TRESIBA FLEXTOUCH) 100 UNIT/ML FlexTouch Pen Inject 43 Units into the skin daily.   ipratropium-albuterol (DUONEB) 0.5-2.5 (3) MG/3ML SOLN Take 3 mLs by nebulization every 6 (six) hours as needed (shortness of breath, wheezing).   ketoconazole (NIZORAL) 2 % cream Apply 1 application topically at  bedtime as needed for irritation.   lidocaine (LIDODERM) 5 % Place 1 or 2 patches to painful area of back each day.  Remove & Discard patch within 12 hours or as directed by MD   metoprolol succinate (TOPROL-XL) 50 MG 24 hr tablet Take 1 tablet by mouth daily. Take with or immediately following a meal.   omeprazole (PRILOSEC) 40 MG capsule Take 1 capsule by mouth in the morning and at bedtime. (Patient taking differently: Take 40 mg by mouth 2 (two) times daily.)   potassium chloride SA (KLOR-CON M) 20 MEQ tablet Take 2 tablets (40 mEq total) by mouth daily.   rosuvastatin (CRESTOR) 20 MG tablet TAKE 1 TABLET BY MOUTH ONCE EVERY NIGHT AT BEDTIME (Patient taking differently: Take 20 mg by mouth at bedtime.)   Semaglutide,0.25 or 0.5MG /DOS, (OZEMPIC, 0.25 OR 0.5 MG/DOSE,) 2 MG/3ML SOPN Inject 0.5 mg into the skin once a week. (Patient taking differently: Inject 0.5 mg into the skin once a week. Monday)   senna-docusate (SENOKOT S) 8.6-50 MG tablet Take 1 to 2 tablets by mouth once or twice a day as needed for constipation   warfarin (COUMADIN) 2.5 MG tablet Take 1 tablet by mouth daily unless otherwise instructed. (Patient taking differently: Take 1.25-2.5 mg by mouth See admin instructions. Taking 2.5 mg on Wed, Friday, Sunday, then 1.25 mg on Tuesday, Thursday, Saturday- not taking any on Monday.)     Allergies: Allergies as of 01/16/2022 - Review Complete 01/16/2022  Allergen Reaction Noted   Dexilant [dexlansoprazole] Other (See Comments) 11/08/2019   Lorazepam Other (See Comments) 11/04/2010   Oxycontin [oxycodone] Other (See Comments) 05/03/2012   Tramadol hcl Swelling    Past Medical History:  Diagnosis Date   Abnormality of lung on CXR 02/02/2020   Nonspecific finding on CXR ordered by pulmonologist - c/w inflammation vs infection, f/u imaging suggested, Dr. Roxy Cedar office has been in communication about recommended next steps.   Anxiety 07/24/2010   Aortic atherosclerosis (HCC)  10/19/2014   Seen on CT scan, currently asymptomatic   Arteriovenous malformation of gastrointestinal tract 08/08/2015   Non-bleeding when visualized on capsule endoscopy 06/30/2015    Arthritis    "lower back; hands" (02/19/2018)   Asthma    Asymptomatic cholelithiasis 09/25/2015   Seen on CT scan 08/2015   Carotid artery stenosis; s/p R endarterectomy    s/p right endarterectomy (06/2010) Carotid US (07/2010):  Left: Moderate-to-severe (60-79%) calcific and non-calcific plaque origin and proximal ICA and ECA    Chronic congestive heart failure with left ventricular diastolic dysfunction (HCC) 10/21/2010   Chronic constipation 02/03/2011   Chronic daily headache 01/16/2014   Chronic iron deficiency anemia    Chronic low back pain 10/06/2012   Chronic venous insufficiency 08/04/2012   Closed fracture of one rib of left side 08/23/2021  ED visit after a fall 08/16/21    COPD (chronic obstructive pulmonary disease) with emphysema (HCC)    PFTs 2018: severe obstructive disease, insignif response to bronchiodilator, mild restriction parenchymal pattern, moderately severe diffusion defect. 2014  FEV1 0.92 (40%), ratio 69, 27% increase in FEV1 with BD, TLC 91%, severe airtrapping, DLCO49% On chronic home O2. Pulmonary rehab referral 05/2012    Dyspnea    Fibromyalgia 08/29/2010   Gastroesophageal reflux disease    History of blood transfusion    "several times"  (02/19/2018)   History of clear cell renal cell carcinoma (HCC), in remission 07/21/2011   s/p cryoablation of left RCC in 09/2011 by Dr. Fredia Sorrow. Followed by Dr. Retta Diones  [facility]) .     History of hiatal hernia    History of mitral valve replacement with bioprosthetic valve due to mitral stenosis 2012   s/p MVR with a 27-mm pericardial porcine valve (Medtronic Mosaic valve, serial #[id number] on 09/20/10, Dr. Donata Clay)    History of obstructive sleep apnea, resolved 2013   resolved per sleep study 07/2019; no apnea, but did  have desaturation.  CPAP no longer necessary.  Nocturnal polysomnography (06/2009): Moderate sleep apnea/ hypopnea syndrome , AHI 17.8 per hour with nonpositional hypopneas. CPAP titration to 12 CWP, AHI 2.4 per hour. On nocturnal CPAP via a small resMed Quattro full-face mask with heated humidifier.    History of pneumonia    "once"  (02/19/2018)   History of seborrheic keratosis 09/28/2015   Hyperlipidemia LDL goal < 100 11/20/2005   Internal and external hemorrhoids without complication 08/04/2012   Lesion of left native kidney 06/01/2020   Incidental finding on recent screening chest CT for lung ca ordered by Dr. Maple Hudson pulmonology - he has ordered a f/u renal U/S.    Lichen sclerosus of female genitalia 01/12/2017   Migraine    "none in years" (02/19/2018)   Moderately severe major depression (HCC) 11/19/2005   Nocturnal hypoxia per sleep study 07/2019    Osteoporosis    DEXA 2016: T -2.7; DEXA (12/09/2011): L-spine T -3.7, left hip T -1.4 DEXA (12/2004): L-spine T -2.6, left hip -0.1    Paroxysmal atrial fibrillation (HCC) 10/22/2010   s/p Left atrial maze procedure for paroxysmal atrial fibrillation on 09/20/2010 by Dr Donata Clay.  Subsequent splenic infarct, decision was made to re-anticoagulate with coumadin, likely life-long as this is the most likely cause of the splenic infarct.    Personal history of colonic polyps 05/14/2011   Colonoscopy (05/2011): 4 mm adenomatous polyp excised endoscopically Colonoscopy (02/2002): Adenomatous polyp excised endoscopically    Personal history of renal cell carcinoma 09/12/2020   Pneumonia    Pulmonary hypertension due to chronic obstructive pulmonary disease (HCC) 04/25/2016   2014 TEE w PA peak pressure 46 mmHg, s/p MV replacement    Right nephrolithiasis, asymptomatic, incidental finding 09/06/2014   5 mm non-obstructing calculus seen on CT scan 09/05/2014    Right ventricular failure (HCC) 04/25/2016   Severe obesity (BMI 35.0-39.9) with comorbidity  (HCC) 10/23/2011   Sleep apnea    Tobacco abuse 07/28/2012   Type 2 diabetes mellitus with diabetic neuropathy (HCC)     Family History:  Family History  Problem Relation Age of Onset   Peptic Ulcer Disease Father    Heart attack Father 3       Died of MI at age 17   Heart attack Brother 69       Died of MI at age 85  Obesity Brother    Pneumonia Mother    Healthy Sister    Lupus Daughter    Obsessive Compulsive Disorder Daughter      Social History: Lives at home alone. Performs all IADLs and ADLs. Social History   Socioeconomic History   Marital status: Divorced    Spouse name: Not on file   Number of children: Not on file   Years of education: Not on file   Highest education level: Not on file  Occupational History   Not on file  Tobacco Use   Smoking status: Every Day    Packs/day: 1.00    Years: 55.00    Total pack years: 55.00    Types: Cigarettes    Start date: 11/01/1962   Smokeless tobacco: Never   Tobacco comments:    At least 1 PPD  Vaping Use   Vaping Use: Never used  Substance and Sexual Activity   Alcohol use: Not Currently    Comment: 02/19/2018 "nothing since 1999"   Drug use: Not Currently    Types: Marijuana    Comment: 02/19/2018 "nothing since the 1990s"   Sexual activity: Not Currently    Birth control/protection: Post-menopausal  Other Topics Concern   Not on file  Social History Narrative   Lives alone in [place] ([address])   Worked at [employer] for 18 years   No car   Social Determinants of Corporate investment banker Strain: Low Risk  (02/09/2019)   Overall Financial Resource Strain (CARDIA)    Difficulty of Paying Living Expenses: Not hard at all  Food Insecurity: No Food Insecurity (01/15/2022)   Hunger Vital Sign    Worried About Running Out of Food in the Last Year: Never true    Ran Out of Food in the Last Year: Never true  Transportation Needs: No Transportation Needs (12/28/2019)   PRAPARE -  Administrator, Civil Service (Medical): No    Lack of Transportation (Non-Medical): No  Physical Activity: Inactive (02/09/2019)   Exercise Vital Sign    Days of Exercise per Week: 0 days    Minutes of Exercise per Session: 0 min  Stress: No Stress Concern Present (02/09/2019)   [employer] of Occupational Health - Occupational Stress Questionnaire    Feeling of Stress : Only a little  Social Connections: Unknown (02/09/2019)   Social Connection and Isolation Panel [NHANES]    Frequency of Communication with Friends and Family: More than three times a week    Frequency of Social Gatherings with Friends and Family: Not on file    Attends Religious Services: Not on file    Active Member of Clubs or Organizations: Not on file    Attends Banker Meetings: Not on file    Marital Status: Divorced  Intimate Partner Violence: Not on file     Review of Systems: A complete ROS was negative except as per HPI.   Physical Exam: Blood pressure (!) 162/45, pulse 74, temperature 98.2 F (36.8 C), temperature source Oral, resp. rate 17, SpO2 97 %. Gen: chronically ill appearing, no acute distress, pleasant and cooperative, smells of cigarette smoke HEENT: conjunctival pallor, dry mucous membranes CV:RRR, no m/r/g, normal s1/s2, 2+ bilateral radial pulses Pulm: normal work of breathing, diffusely diminished lung sounds, stray wheezes throughout Extremities: warm, dry, no LE edema Neuro: A&O x4, CN 2-12 intact, attempted dix hallpike to the left but unable to get patients head below parallel with the floor no nystagmus  or dizziness with this attempt  EKG: personally reviewed my interpretation is: not yet obtained  CXR: personally reviewed my interpretation is no acute cardiopulmonary process  Assessment & Plan by Problem: Principal Problem:   Frequent falls Ms. Watlington is a 71 y/o woman with a pmh of multiple falls,worsening of her recurrent iron deficient anemia,   T2DM c/b neuropathy,CKD3a,COPD ,chronic multifactorial generalized pain , PAD, HFpEF , MDD who is directly admitted to the hospital per Dr. [name] for iron deficiency anemia with 2 days of dizziness and multiple falls.  Dizziness IDA Patient recently sustained a fall 2 days ago with head trauma and found to have no acute pathology on CT head and C-spine. Evaluated in the outpatient setting for this yesterday due to numerous episodes lasting 30 seconds - 1 minute described as the room spinning with associated nausea induced by leaning forward, lying down and standing up. Directly admitted by Dr. [name] given Hgb of 8.0 from 11.2 5 months ago with known IDA history. Hemoglobin in the ED found to be 8.3 but will transfuse at the request of Dr. [name] as this may reflect symptomatic anemia and the patient has not been able to tolerate oral or IV iron (reaction) in the past. While her symptoms could potentially reflect an orthostatic hypotension vs poor cerebral oxygenation from IDA , the short duration of vertiginous symptoms with onset with positional changes seems consistent with BPPV. Additionally would not expect orthostatic symptoms with laying down. Did attempt left sided Dix hallpike but unable to get the patients head below parallel. No nystagmus or dizziness with failed maneuver. Did not attempt on the R side. Patient was dizzy with sitting back up. Do not suspect meneir given no loss of hearing, no tinnitus and the short duration of dizziness. Not consistent with vestibular neuritis given short episodic symptoms with positional change and no recent illness. No concern for central vertigo such as stroke given that she is able to walk normally and does not have persistent vertigo. Also cranial nerve exam normal so do not suspect a new bleed following her fall to have developed and do not think additional head imaging is needed.Lack of syncope and positionally induced dizziness is not consistent with  cardiac causes. I suspect her falls are related to her numerous centrally acting medications but these do not explain acute onset dizziness. -F/u EKG -daily cbc -Dix hallpike -Transfuse 1 unit, follow up H&H -PT/OT  T2DM -tresiba 43u daily  CKD3a Baseline creatinine 1.0-1.20, at 1.06 now. -daily bmp  COPD -fluticasone-umeclidinium-vilanterol -albuterol prn  HTNHFpEF -Hold lasix 80mg  AM, 40mg  PM - metoprolol succinate 50mg  daily  PAD -Coumadin, goal INR 2.5-3.5 -rosuvastatin 20mg  daily  Chronic pain Peripheral neuropathy -gabapentin 600mg  BID -Norco 5-325mg  1-2 tablets TID -cymbalta 60mg  BID -Senna s 2 tablets qHS  MDD Insomnia -bupropion 150mg  qAM -xanax 1mg  nightly  Dispo: Admit patient to Observation with expected length of stay less than 2 midnights.  Signed: Willette Cluster, MD 01/16/2022, 10:40 PM  Pager: [phone] After 5pm on weekdays and 1pm on weekends: On Call pager: [phone]

## 2022-01-16 NOTE — Progress Notes (Signed)
[name] is an acute visit today for follow-up of falls leading to an ED visit early yesterday morning, striking her head without loss of consciousness, presenting to ED as a trauma eval as she is currently on anticoagulation, though no injury was sustained and head CT neg for bleeding.  She subsequently went home and fell twice more.  Feels quite ill, experiencing poor appetite, profound fatigue, dizziness upon standing, constant dull chest discomfort, and sensation that she is getting anemic.  Indeed, her hemoglobin was found to have dropped from 11.2  five months ago, to 8.0 noted yesterday, with MCV of 74.6, declined from 87.8  five months ago.  Creatinine had increased from 1.01 five months ago to 1.29 with EGFR of 44.  INR was 1.6 yesterday (Dr. Johney Maine has been working on Coumadin adjustment-she takes her Coumadin reliably).she gets it very rarely, and most of her falls had been from the chair which is where she typically sleeps.  She has been worried about standing up and getting dizzy, near of falling and has no one at home to assist her.    Additional ROS:  Constipation recently, took miralax and senokot and moved a large stool burden, followed the next day by similar size, stopped her toilets up.  Last bm about 3 day ago; no blood or black color.  Constant runny nose, chronic cough, eyes itching.  Yellow nasal discharge first thing in the morning then clears.    Mouth dry, feels dehydrated.   Recent medicine changes-on her own volition she has reduced consistency of daily crestor, duloxetine, prilosec, bupropion.  takes pain medicine in the morning (head/back/legs), stays up to MOW comes late morning, goes back to bed, sleeps all day.  Takes xanax only at night.    Changed gabapentin on her own so is taking two in morning and two at night (prescribed 1 in the morning and 2 at night).  Never takes together with the opioid.    Didn't take lasix or potassium yesterday; occasionally but not  intentionally misses metoprolol.  Seldom takes zofran.  Physical exam  BP (!) 135/40 (BP Location: Right Arm, Patient Position: Sitting, Cuff Size: Small)   Pulse 74   Temp 97.6 F (36.4 C) (Oral)   Ht _0  (1.575 m)   Wt 161 lb 6.4 oz (73.2 kg)   SpO2 90%   BMI 29.52 kg/m  Ms. Consuello Closs appears ill-tired concerned affect, harsh congested cough occasionally, no energy-though no acute distress.  This is much different from her baseline.  Constant sniffling, nose with clear rhinorrhea.  Lungs with bibasilar rales lower half posterior lung fields, perhaps slightly increased from prior visits, though she always has some LLL rales.  No JVD in sitting position.  Heart RRR with short midsystolic murmur along left sternal border.  Hands are warm and pink, radial pulses full and symmetric.  Feet with chronically cool and cyanotic toes and pulseless at baseline.  No wounds.  Skin turgor is reduced.  She is more pale than usual.  Assessment and plan:  71 yo chronically ill but fiercely independent woman experiencing multiple falls and fatigue and found to have worsening of her recurrent Fe-deficient anemia, who is living with well-controlled O8NO (complicated by neuropathy, angiopathy, and nephropathy/CKD3a); COPD followed by Dr. Annamaria Boots (nocturnal hypoxic respiratory failure treated with CPAP, though without overt OSA on sleep testing; ongoing smoking); chronic multifactorial generalized pain on chronic opioid therapy (joints, muscles, spine, headache), PAD, HFpEF followed by Dr. Aundra Dubin, and active major depression  for which she has a pending referral to a [facility] therapist.  Due to her dizziness (anemia?) and hypersomnolence (medications?), chest pain, and dypsnea with multiple chronic conditions requiring monitoring, plan is to admit for blood transfusion and monitoring/adjustment of medications based on current needs.  She does not have anyone at home to help her and is willing to go to SNF for rehab upon  discharge.   She has intolerance to and an adequate response to oral iron.  Sustained a allergic type response to IV iron the last time it was administered and we have not attempted again.  I have made a referral to hematology at last visit which did not connect, and replaced the referral yesterday so that we can get input on safest iron supplementation.  In the meantime, blood transfusion will be the most appropriate.

## 2022-01-16 NOTE — ED Triage Notes (Signed)
Patient sent to ED for admission for anemia, patient unsure of most recent Hgb but states she was told she needed a blood transfusion. Patient is alert, oriented, complains of generalized weakness, denies known source of bleeding.

## 2022-01-16 NOTE — ED Provider Triage Note (Signed)
Emergency Medicine Provider Triage Evaluation Note  [name] , a 71 y.o. female  was evaluated in triage.  Pt complains of anemia and was sent here by doctor.  Oral had a 40 functional decline over the past 3 weeks.  She has had worsening generalized weakness, intermittent chest pain, worsening shortness of breath with ambulation.  She has had multiple falls over the past week.  Evaluated in the ED just yesterday after a fall.  She has CT imaging of her head and cervical spine which were negative.  This she was incidentally found to have a hemoglobin of 8.0.  She was discharged home and continued to have worsening symptoms.  She is also had a fall since then.  She says she is continue to have a headache from where she hit her head the other day.  Her doctor sent her here to be admitted for symptomatic anemia.  Review of Systems  Positive:  Negative:   Physical Exam  BP (!) 145/52 (BP Location: Right Arm)   Pulse 66   Temp 98.2 F (36.8 C) (Oral)   Resp 18   SpO2 94%  Gen:   Awake, no distress   Resp:  Normal effort  MSK:   Moves extremities without difficulty  Other:    Medical Decision Making  Medically screening exam initiated at 2:24 PM.  Appropriate orders placed.  [name] was informed that the remainder of the evaluation will be completed by another provider, this initial triage assessment does not replace that evaluation, and the importance of remaining in the ED until their evaluation is complete.     Adolphus Birchwood, PA-C 01/16/22 1434

## 2022-01-17 ENCOUNTER — Encounter (HOSPITAL_COMMUNITY): Payer: Self-pay | Admitting: Internal Medicine

## 2022-01-17 ENCOUNTER — Other Ambulatory Visit: Payer: Self-pay

## 2022-01-17 ENCOUNTER — Observation Stay (HOSPITAL_COMMUNITY): Payer: HMO

## 2022-01-17 DIAGNOSIS — R42 Dizziness and giddiness: Secondary | ICD-10-CM

## 2022-01-17 DIAGNOSIS — I639 Cerebral infarction, unspecified: Secondary | ICD-10-CM | POA: Diagnosis not present

## 2022-01-17 DIAGNOSIS — D500 Iron deficiency anemia secondary to blood loss (chronic): Secondary | ICD-10-CM | POA: Diagnosis not present

## 2022-01-17 DIAGNOSIS — Z8673 Personal history of transient ischemic attack (TIA), and cerebral infarction without residual deficits: Secondary | ICD-10-CM

## 2022-01-17 DIAGNOSIS — F17210 Nicotine dependence, cigarettes, uncomplicated: Secondary | ICD-10-CM

## 2022-01-17 DIAGNOSIS — R296 Repeated falls: Secondary | ICD-10-CM

## 2022-01-17 HISTORY — DX: Personal history of transient ischemic attack (TIA), and cerebral infarction without residual deficits: Z86.73

## 2022-01-17 LAB — BASIC METABOLIC PANEL
Anion gap: 8 (ref 5–15)
BUN: 15 mg/dL (ref 8–23)
CO2: 26 mmol/L (ref 22–32)
Calcium: 8.8 mg/dL — ABNORMAL LOW (ref 8.9–10.3)
Chloride: 102 mmol/L (ref 98–111)
Creatinine, Ser: 1.21 mg/dL — ABNORMAL HIGH (ref 0.44–1.00)
GFR, Estimated: 48 mL/min — ABNORMAL LOW (ref >60)
Glucose, Bld: 248 mg/dL — ABNORMAL HIGH (ref 70–99)
Potassium: 4 mmol/L (ref 3.5–5.1)
Sodium: 136 mmol/L (ref 135–145)

## 2022-01-17 LAB — GLUCOSE, CAPILLARY
Glucose-Capillary: 150 mg/dL — ABNORMAL HIGH (ref 70–99)
Glucose-Capillary: 146 mg/dL — ABNORMAL HIGH (ref 70–99)

## 2022-01-17 LAB — CBC
HCT: 32.1 % — ABNORMAL LOW (ref 36.0–46.0)
Hemoglobin: 9.1 g/dL — ABNORMAL LOW (ref 12.0–15.0)
MCH: 22.4 pg — ABNORMAL LOW (ref 26.0–34.0)
MCHC: 28.3 g/dL — ABNORMAL LOW (ref 30.0–36.0)
MCV: 78.9 fL — ABNORMAL LOW (ref 80.0–100.0)
Platelets: 212 10*3/uL (ref 150–400)
RBC: 4.07 MIL/uL (ref 3.87–5.11)
RDW: 18.5 % — ABNORMAL HIGH (ref 11.5–15.5)
WBC: 7 10*3/uL (ref 4.0–10.5)
nRBC: 0 % (ref 0.0–0.2)

## 2022-01-17 LAB — PROTIME-INR
INR: 1.5 — ABNORMAL HIGH (ref 0.8–1.2)
Prothrombin Time: 18.1 seconds — ABNORMAL HIGH (ref 11.4–15.2)

## 2022-01-17 MED ORDER — ORAL CARE MOUTH RINSE: 15.0000 mL | OROMUCOSAL | Status: DC | PRN

## 2022-01-17 MED ORDER — WARFARIN SODIUM 2.5 MG PO TABS
2.5000 mg | ORAL_TABLET | Freq: Once | ORAL | Status: AC
  Administered 2022-01-17: 2.5 mg via ORAL
  Filled 2022-01-17: qty 1

## 2022-01-17 MED ORDER — INSULIN GLARGINE-YFGN 100 UNIT/ML ~~LOC~~ SOLN
43.0000 [IU] | Freq: Every day | SUBCUTANEOUS | Status: DC
  Administered 2022-01-17 – 2022-01-19 (×3): 43 [IU] via SUBCUTANEOUS
  Filled 2022-01-17 (×4): qty 0.43

## 2022-01-17 MED ORDER — GADOBUTROL 1 MMOL/ML IV SOLN
7.0000 mL | Freq: Once | INTRAVENOUS | Status: AC | PRN
  Administered 2022-01-17: 7 mL via INTRAVENOUS

## 2022-01-17 NOTE — Progress Notes (Addendum)
[facility] for Warfarin Indication: atrial fibrillation  Allergies  Allergen Reactions   Dexilant [Dexlansoprazole] Other (See Comments)    Made acid reflux worse   Lorazepam Other (See Comments)    Patient's sister noted that ativan caused the patient to become extremely confused during hospitalization 09/2010; tolerates Xanax   Oxycontin [Oxycodone] Other (See Comments)    headache   Tramadol Hcl Swelling    Ankle swelling    Patient Measurements:    Vital Signs: Temp: 98.1 F (36.7 C) (12/08 0814) Temp Source: Oral (12/08 0814) BP: 127/77 (12/08 0603) Pulse Rate: 79 (12/08 0603)  Labs: Recent Labs    01/15/22 0453 01/16/22 1419 01/16/22 1434 01/16/22 1832 01/17/22 0425  HGB 8.0* 8.3*  --   --  9.1*  HCT 28.2* 31.4*  --   --  32.1*  PLT 214 278  --   --  212  LABPROT 18.6* 17.2*  --   --  18.1*  INR 1.6* 1.4*  --   --  1.5*  CREATININE 1.29*  --  1.06*  --  1.21*  TROPONINIHS  --   --  9 8  --      Estimated Creatinine Clearance: 39.3 mL/min (A) (by C-G formula based on SCr of 1.21 mg/dL (H)).   Medications:  Scheduled:   buPROPion  150 mg Oral Daily   DULoxetine  60 mg Oral BID   fluticasone  2 spray Each Nare Daily   gabapentin  600 mg Oral QHS   And   gabapentin  300 mg Oral Daily   loratadine  10 mg Oral Daily   metoprolol succinate  50 mg Oral Daily   mometasone-formoterol  2 puff Inhalation BID   nicotine  14 mg Transdermal Daily   pantoprazole  40 mg Oral BID   rosuvastatin  20 mg Oral Daily   umeclidinium bromide  1 puff Inhalation Daily   Warfarin - Pharmacist Dosing Inpatient   Does not apply q1600   Infusions:   Assessment: 30 YOF presenting for symptomatic anemia with history of long-standing iron deficiency anemia. On warfarin PTA for atrial fibrillation. Hgb 8.3 and patient received a blood transfusion. Clarified with admitting - okay to restart warfarin. Last dose 12/6 _0   PTA regimen: 2.4m  Wed/Fri/Sun; 1.247mTu/Thur/Sun, no dose on Mondays  INR today is at lower range of therapeutic at 1.5. HgB stable, patient did receive 1 unit RBC on 12/7 for suspected IDA contributing to dizziness.    Goal of Therapy:  INR goal 1.5-2.5 per chart review and confirmation from IMTS  Monitor platelets by anticoagulation protocol: Yes   Plan:  Will give home dose of 2.69m57m1 for Fridays. Time dose out to 22:00 as patient did not receive yesterday's dose until 03:00 on 12/8.  Daily INR and monitoring for signs/symptoms of bleeding  HeaEsmeralda ArthurharmD, BCCCP ' Clinical Pharmacist 01/17/2022,8:49 AM

## 2022-01-17 NOTE — ED Notes (Signed)
ED TO INPATIENT HANDOFF REPORT   S Name/Age/Gender [name] 71 y.o. female Room/Bed: 013C/013C  Code Status   Code Status: Full Code  Home/SNF/Other Home Patient oriented to: self, place, time, and situation Is this baseline? Yes   Triage Complete: Triage complete  Chief Complaint Frequent falls [R29.6]  Triage Note Patient sent to ED for admission for anemia, patient unsure of most recent Hgb but states she was told she needed a blood transfusion. Patient is alert, oriented, complains of generalized weakness, denies known source of bleeding.   Allergies Allergies  Allergen Reactions   Dexilant [Dexlansoprazole] Other (See Comments)    Made acid reflux worse   Lorazepam Other (See Comments)    Patient's sister noted that ativan caused the patient to become extremely confused during hospitalization 09/2010; tolerates Xanax   Oxycontin [Oxycodone] Other (See Comments)    headache   Tramadol Hcl Swelling    Ankle swelling    Level of Care/Admitting Diagnosis ED Disposition     ED Disposition  Admit   Condition  --   [place]: [place] [[id number]]  Level of Care: Med-Surg [16]  May place patient in observation at [facility] or [place] if equivalent level of care is available:: No  Covid Evaluation: Asymptomatic - no recent exposure (last 10 days) testing not required  Diagnosis: Frequent falls [382505]  Admitting Physician: Bosie Helper  Attending Physician: Bosie Helper          B Medical/Surgery History Past Medical History:  Diagnosis Date   Abnormality of lung on CXR 02/02/2020   Nonspecific finding on CXR ordered by pulmonologist - c/w inflammation vs infection, f/u imaging suggested, Dr. [name] office has been in communication about recommended next steps.   Anxiety 07/24/2010   Aortic atherosclerosis ([place]) 10/19/2014   Seen on CT scan, currently asymptomatic   Arteriovenous  malformation of gastrointestinal tract 08/08/2015   Non-bleeding when visualized on capsule endoscopy 06/30/2015    Arthritis    "lower back; hands" (02/19/2018)   Asthma    Asymptomatic cholelithiasis 09/25/2015   Seen on CT scan 08/2015   Carotid artery stenosis; s/p R endarterectomy    s/p right endarterectomy (06/2010) Carotid US (07/2010):  Left: Moderate-to-severe (60-79%) calcific and non-calcific plaque origin and proximal ICA and ECA    Chronic congestive heart failure with left ventricular diastolic dysfunction (HCC) 10/21/2010   Chronic constipation 02/03/2011   Chronic daily headache 01/16/2014   Chronic iron deficiency anemia    Chronic low back pain 10/06/2012   Chronic venous insufficiency 08/04/2012   Closed fracture of one rib of left side 08/23/2021   ED visit after a fall 08/16/21    COPD (chronic obstructive pulmonary disease) with emphysema ([place])    PFTs 2018: severe obstructive disease, insignif response to bronchiodilator, mild restriction parenchymal pattern, moderately severe diffusion defect. 2014  FEV1 0.92 (40%), ratio 69, 27% increase in FEV1 with BD, TLC 91%, severe airtrapping, DLCO49% On chronic home O2. Pulmonary rehab referral 05/2012    Dyspnea    Fibromyalgia 08/29/2010   Gastroesophageal reflux disease    History of blood transfusion    "several times"  (02/19/2018)   History of clear cell renal cell carcinoma ([place]), in remission 07/21/2011   s/p cryoablation of left RCC in 09/2011 by [name]. Followed by [name]  [facility]) .     History of hiatal hernia    History of mitral valve replacement  with bioprosthetic valve due to mitral stenosis 2012   s/p MVR with a 27-mm pericardial porcine valve (Medtronic Mosaic valve, serial #[id number] on 09/20/10, Dr. Prescott Gum)    History of obstructive sleep apnea, resolved 2013   resolved per sleep study 07/2019; no apnea, but did have desaturation.  CPAP no longer necessary.  Nocturnal polysomnography  (06/2009): Moderate sleep apnea/ hypopnea syndrome , AHI 17.8 per hour with nonpositional hypopneas. CPAP titration to 12 CWP, AHI 2.4 per hour. On nocturnal CPAP via a small resMed Quattro full-face mask with heated humidifier.    History of pneumonia    "once"  (02/19/2018)   History of seborrheic keratosis 09/28/2015   Hyperlipidemia LDL goal < 100 11/20/2005   Internal and external hemorrhoids without complication 09/81/1914   Lesion of left native kidney 06/01/2020   Incidental finding on recent screening chest CT for lung ca ordered by Dr. Annamaria Boots pulmonology - he has ordered a f/u renal U/S.    Lichen sclerosus of female genitalia 01/12/2017   Migraine    "none in years" (02/19/2018)   Moderately severe major depression ([place]) 11/19/2005   Nocturnal hypoxia per sleep study 07/2019    Osteoporosis    DEXA 2016: T -2.7; DEXA (12/09/2011): L-spine T -3.7, left hip T -1.4 DEXA (12/2004): L-spine T -2.6, left hip -0.1    Paroxysmal atrial fibrillation ([place]) 10/22/2010   s/p Left atrial maze procedure for paroxysmal atrial fibrillation on 09/20/2010 by Dr Prescott Gum.  Subsequent splenic infarct, decision was made to re-anticoagulate with coumadin, likely life-long as this is the most likely cause of the splenic infarct.    Personal history of colonic polyps 05/14/2011   Colonoscopy (05/2011): 4 mm adenomatous polyp excised endoscopically Colonoscopy (02/2002): Adenomatous polyp excised endoscopically    Personal history of renal cell carcinoma 09/12/2020   Pneumonia    Pulmonary hypertension due to chronic obstructive pulmonary disease ([place]) 04/25/2016   2014 TEE w PA peak pressure 46 mmHg, s/p MV replacement    Right nephrolithiasis, asymptomatic, incidental finding 09/06/2014   5 mm non-obstructing calculus seen on CT scan 09/05/2014    Right ventricular failure ([place]) 04/25/2016   Severe obesity (BMI 35.0-39.9) with comorbidity ([place]) 10/23/2011   Sleep apnea    Tobacco abuse 07/28/2012   Type 2  diabetes mellitus with diabetic neuropathy [facility])    Past Surgical History:  Procedure Laterality Date   BIOPSY  07/19/2021   Procedure: BIOPSY;  Surgeon: Clarene Essex, MD;  Location: [facility] ENDOSCOPY;  Service: Gastroenterology;;   CARDIAC CATHETERIZATION     CARDIAC VALVE REPLACEMENT  Aug. 2012   "mitral valve"   CAROTID ENDARTERECTOMY Right 07/04/2010   by [name] for asymptomatic right carotid artery stenosis   CATARACT EXTRACTION W/ INTRAOCULAR LENS  IMPLANT, BILATERAL Bilateral    CHEST TUBE INSERTION  09/24/2010   Dr Lucianne Lei Trigt   COLONOSCOPY  05/12/2011   performed by Dr. Michail Sermon. Showing small internal hemorrhoids, single tubular adenoma polyp   COLONOSCOPY WITH PROPOFOL N/A 05/29/2020   Procedure: COLONOSCOPY WITH PROPOFOL;  Surgeon: Clarene Essex, MD;  Location: [facility];  Service: Endoscopy;  Laterality: N/A;   COLONOSCOPY WITH PROPOFOL N/A 07/19/2021   Procedure: COLONOSCOPY WITH PROPOFOL;  Surgeon: Clarene Essex, MD;  [facility];  Service: Gastroenterology;  Laterality: N/A;   CRYOABLATION Left 09/2011   by [name]. Followed by [name]  [facility]) .     DILATION AND CURETTAGE OF UTERUS     ESOPHAGOGASTRODUODENOSCOPY  05/12/2011  performed by Dr. Michail Sermon. Negative for ulcerations, biopsy negative for evidence of celiac sprue   ESOPHAGOGASTRODUODENOSCOPY N/A 06/29/2015   Procedure: ESOPHAGOGASTRODUODENOSCOPY (EGD);  Surgeon: Clarene Essex, MD;  Location: [facility] ENDOSCOPY;  Service: Endoscopy;  Laterality: N/A;   ESOPHAGOGASTRODUODENOSCOPY N/A 03/29/2016   Procedure: ESOPHAGOGASTRODUODENOSCOPY (EGD);  Surgeon: Clarene Essex, MD;  Location: [facility] ENDOSCOPY;  Service: Endoscopy;  Laterality: N/A;   ESOPHAGOGASTRODUODENOSCOPY (EGD) WITH PROPOFOL N/A 05/29/2020   Procedure: ESOPHAGOGASTRODUODENOSCOPY (EGD) WITH PROPOFOL;  Surgeon: Clarene Essex, MD;  [facility];  Service: Endoscopy;  Laterality: N/A;   FRACTURE SURGERY     GIVENS CAPSULE STUDY N/A 06/30/2015    Procedure: GIVENS CAPSULE STUDY;  Surgeon: Clarene Essex, MD;  Location: [place];  Service: Endoscopy;  Laterality: N/A;   GIVENS CAPSULE STUDY N/A 06/29/2015   Procedure: GIVENS CAPSULE STUDY;  Surgeon: Clarene Essex, MD;  Location: [place];  Service: Endoscopy;  Laterality: N/A;   HEMORRHOID SURGERY  1970s?   "lanced"   HYSTEROSCOPY W/ ENDOMETRIAL ABLATION  06/2001   for persistent post-menopausal bleeding // by S. [name], M.D.   IR GENERIC HISTORICAL  08/23/2015   IR RADIOLOGIST EVAL & MGMT 08/23/2015 Aletta Edouard, MD [facility] INTERV RAD   IR GENERIC HISTORICAL  04/09/2016   IR RADIOLOGIST EVAL & MGMT 04/09/2016 Aletta Edouard, MD [facility] INTERV RAD   IR RADIOLOGIST EVAL & MGMT  10/07/2016   IR RADIOLOGIST EVAL & MGMT  06/25/2017   IR RADIOLOGIST EVAL & MGMT  07/31/2020   IR RADIOLOGIST EVAL & MGMT  10/02/2020   IR RADIOLOGIST EVAL & MGMT  12/20/2020   IR RADIOLOGIST EVAL & MGMT  12/25/2021   LEFT HEART CATH AND CORONARY ANGIOGRAPHY N/A 04/21/2016   Procedure: Left Heart Cath and Coronary Angiography;  Surgeon: Lorretta Harp, MD;  Location: [place] CV LAB;  Service: Cardiovascular;  Laterality: N/A;   LIPOMA EXCISION  08/2005   occipital lipoma 1.5cm - by Dr. [name]   LITHOTRIPSY  ~ 2000   MAZE Left 09/20/10   for paroxysmal atrial fibrillation (Dr. Prescott Gum)   MITRAL VALVE REPLACEMENT  09/20/10    with a 27-mm pericardial porcine valve (Medtronic Mosaic valve, serial #[id number]). 09/20/10, Dr Prescott Gum   ORIF CLAVICLE FRACTURE Right 01/2004   by Thana Farr. [name], M.D for Right clavicle nonunion.; "it's got a pin in it"   POLYPECTOMY  05/29/2020   Procedure: POLYPECTOMY;  Surgeon: Clarene Essex, MD;  [facility];  Service: Endoscopy;;   POLYPECTOMY  07/19/2021   Procedure: POLYPECTOMY;  Surgeon: Clarene Essex, MD;  [facility];  Service: Gastroenterology;;   RADIOLOGY WITH ANESTHESIA Left 09/12/2020   Procedure: RADIOLOGY WITH ANESTHESIA- RENAL CRYOABLATION;   Surgeon: Aletta Edouard, MD;  [facility];  Service: Radiology;  Laterality: Left;   REFRACTIVE SURGERY Bilateral    RIGHT HEART CATH N/A 04/23/2016   Procedure: Right Heart Cath;  Surgeon: [name], MD;  Location: [place] CV LAB;  Service: Cardiovascular;  Laterality: N/A;   TONSILLECTOMY     TUBAL LIGATION       A IV Location/Drains/Wounds Patient Lines/Drains/Airways Status     Active Line/Drains/Airways     Name Placement date Placement time Site Days   Peripheral IV 01/16/22 20 G 1.16" Anterior;Left Forearm 01/16/22  2221  Forearm  1            Intake/Output Last 24 hours  Intake/Output Summary (Last 24 hours) at 01/17/2022 1111 Last data filed at 01/17/2022 0415 Gross per  24 hour  Intake 325 ml  Output --  Net 325 ml    Labs/Imaging Results for orders placed or performed during the hospital encounter of 01/16/22 (from the past 48 hour(s))  CBC with Differential     Status: Abnormal   Collection Time: 01/16/22  2:19 PM  Result Value Ref Range   WBC 9.1 4.0 - 10.5 K/uL   RBC 4.02 3.87 - 5.11 MIL/uL   Hemoglobin 8.3 (L) 12.0 - 15.0 g/dL   HCT 31.4 (L) 36.0 - 46.0 %   MCV 78.1 (L) 80.0 - 100.0 fL   MCH 20.6 (L) 26.0 - 34.0 pg   MCHC 26.4 (L) 30.0 - 36.0 g/dL   RDW 18.6 (H) 11.5 - 15.5 %   Platelets 278 150 - 400 K/uL   nRBC 0.2 0.0 - 0.2 %   Neutrophils Relative % 74 %   Neutro Abs 6.8 1.7 - 7.7 K/uL   Lymphocytes Relative 15 %   Lymphs Abs 1.3 0.7 - 4.0 K/uL   Monocytes Relative 9 %   Monocytes Absolute 0.8 0.1 - 1.0 K/uL   Eosinophils Relative 1 %   Eosinophils Absolute 0.1 0.0 - 0.5 K/uL   Basophils Relative 1 %   Basophils Absolute 0.1 0.0 - 0.1 K/uL   Immature Granulocytes 0 %   Abs Immature Granulocytes 0.03 0.00 - 0.07 K/uL    Comment: Performed at [facility], [address] [place], [place] [address]  Protime-INR     Status: Abnormal   Collection Time: 01/16/22  2:19 PM  Result Value Ref Range   Prothrombin Time 17.2 (H)  11.4 - 15.2 seconds   INR 1.4 (H) 0.8 - 1.2    Comment: (NOTE) INR goal varies based on device and disease states. Performed at [facility], [place] [address] [place], [place] [address]   Resp Panel by RT-PCR (Flu A&B, Covid) Anterior Nasal Swab     Status: None   Collection Time: 01/16/22  2:20 PM   Specimen: Anterior Nasal Swab  Result Value Ref Range   SARS Coronavirus 2 by RT PCR NEGATIVE NEGATIVE    Comment: (NOTE) SARS-CoV-2 target nucleic acids are NOT DETECTED.  The SARS-CoV-2 RNA is generally detectable in upper respiratory specimens during the acute phase of infection. The lowest concentration of SARS-CoV-2 viral copies this assay can detect is 138 copies/mL. A negative result does not preclude SARS-Cov-2 infection and should not be used as the sole basis for treatment or other patient management decisions. A negative result may occur with  improper specimen collection/handling, submission of specimen other than nasopharyngeal swab, presence of viral mutation(s) within the areas targeted by this assay, and inadequate number of viral copies(<138 copies/mL). A negative result must be combined with clinical observations, patient history, and epidemiological information. The expected result is Negative.  Fact Sheet for Patients:  EntrepreneurPulse.com.au  Fact Sheet for Healthcare Providers:  IncredibleEmployment.be  This test is no t yet approved or cleared by the [place] FDA and  has been authorized for detection and/or diagnosis of SARS-CoV-2 by FDA under an Emergency Use Authorization (EUA). This EUA will remain  in effect (meaning this test can be used) for the duration of the COVID-19 declaration under Section 564(b)(1) of the Act, 21 U.S.C.section 360bbb-3(b)(1), unless the authorization is terminated  or revoked sooner.       Influenza A by PCR NEGATIVE NEGATIVE   Influenza B by PCR NEGATIVE NEGATIVE    Comment:  (NOTE)  The Xpert Xpress SARS-CoV-2/FLU/RSV plus assay is intended as an aid in the diagnosis of influenza from Nasopharyngeal swab specimens and should not be used as a sole basis for treatment. Nasal washings and aspirates are unacceptable for Xpert Xpress SARS-CoV-2/FLU/RSV testing.  Fact Sheet for Patients: EntrepreneurPulse.com.au  Fact Sheet for Healthcare Providers: IncredibleEmployment.be  This test is not yet approved or cleared by the [place] FDA and has been authorized for detection and/or diagnosis of SARS-CoV-2 by FDA under an Emergency Use Authorization (EUA). This EUA will remain in effect (meaning this test can be used) for the duration of the COVID-19 declaration under Section 564(b)(1) of the Act, 21 U.S.C. section 360bbb-3(b)(1), unless the authorization is terminated or revoked.  Performed at [facility], [place] [address] [place], [place] [address]   Comprehensive metabolic panel     Status: Abnormal   Collection Time: 01/16/22  2:34 PM  Result Value Ref Range   Sodium 138 135 - 145 mmol/L   Potassium 4.9 3.5 - 5.1 mmol/L   Chloride 107 98 - 111 mmol/L   CO2 22 22 - 32 mmol/L   Glucose, Bld 155 (H) 70 - 99 mg/dL    Comment: Glucose reference range applies only to samples taken after fasting for at least 8 hours.   BUN 14 8 - 23 mg/dL   Creatinine, Ser 1.06 (H) 0.44 - 1.00 mg/dL   Calcium 9.2 8.9 - 10.3 mg/dL   Total Protein 6.5 6.5 - 8.1 g/dL   Albumin 3.5 3.5 - 5.0 g/dL   AST 21 15 - 41 U/L   ALT 13 0 - 44 U/L   Alkaline Phosphatase 50 38 - 126 U/L   Total Bilirubin 0.4 0.3 - 1.2 mg/dL   GFR, Estimated 56 (L) >60 mL/min    Comment: (NOTE) Calculated using the CKD-EPI Creatinine Equation (2021)    Anion gap 9 5 - 15    Comment: Performed at [facility] [address] [place], [place] [address]  Type and screen [place]     Status: None (Preliminary result)   Collection Time:  01/16/22  2:34 PM  Result Value Ref Range   ABO/RH(D) A NEG    Antibody Screen NEG    Sample Expiration 01/19/2022,2359    Unit Number [id number]    Blood Component Type RED CELLS,LR    Unit division 00    Status of Unit ISSUED    Transfusion Status OK TO TRANSFUSE    Crossmatch Result      Compatible Performed at [facility], [place] [address], [place], [place] [address]   Troponin I (High Sensitivity)     Status: None   Collection Time: 01/16/22  2:34 PM  Result Value Ref Range   Troponin I (High Sensitivity) 9 <18 ng/L    Comment: (NOTE) Elevated high sensitivity troponin I (hsTnI) values and significant  changes across serial measurements may suggest ACS but many other  chronic and acute conditions are known to elevate hsTnI results.  Refer to the "Links" section for chest pain algorithms and additional  guidance. Performed at [facility], [place] [address] [place], [place] [address]   Urinalysis, Routine w reflex microscopic Urine, Clean Catch     Status: Abnormal   Collection Time: 01/16/22  4:00 PM  Result Value Ref Range   Color, Urine YELLOW YELLOW   APPearance HAZY (A) CLEAR   Specific Gravity, Urine 1.010 1.005 - 1.030  pH 5.0 5.0 - 8.0   Glucose, UA NEGATIVE NEGATIVE mg/dL   Hgb urine dipstick NEGATIVE NEGATIVE   Bilirubin Urine NEGATIVE NEGATIVE   Ketones, ur NEGATIVE NEGATIVE mg/dL   Protein, ur NEGATIVE NEGATIVE mg/dL   Nitrite NEGATIVE NEGATIVE   Leukocytes,Ua MODERATE (A) NEGATIVE   RBC / HPF 0-5 0 - 5 RBC/hpf   WBC, UA 11-20 0 - 5 WBC/hpf   Bacteria, UA MANY (A) NONE SEEN   Squamous Epithelial / LPF 0-5 0 - 5   Hyaline Casts, UA PRESENT     Comment: Performed at [facility], [place] [address], [place], [place] [address]  Troponin I (High Sensitivity)     Status: None   Collection Time: 01/16/22  6:32 PM  Result Value Ref Range   Troponin I (High Sensitivity) 8 <18 ng/L    Comment: (NOTE) Elevated high sensitivity troponin I  (hsTnI) values and significant  changes across serial measurements may suggest ACS but many other  chronic and acute conditions are known to elevate hsTnI results.  Refer to the "Links" section for chest pain algorithms and additional  guidance. Performed at [facility], [place] [address] [place], [place] [address]   Prepare RBC (crossmatch)     Status: None   Collection Time: 01/16/22  9:24 PM  Result Value Ref Range   Order Confirmation      ORDER PROCESSED BY BLOOD BANK Performed at [facility], [place] [address] [place], [place] [address]   CBC     Status: Abnormal   Collection Time: 01/17/22  4:25 AM  Result Value Ref Range   WBC 7.0 4.0 - 10.5 K/uL   RBC 4.07 3.87 - 5.11 MIL/uL   Hemoglobin 9.1 (L) 12.0 - 15.0 g/dL   HCT 32.1 (L) 36.0 - 46.0 %   MCV 78.9 (L) 80.0 - 100.0 fL   MCH 22.4 (L) 26.0 - 34.0 pg   MCHC 28.3 (L) 30.0 - 36.0 g/dL   RDW 18.5 (H) 11.5 - 15.5 %   Platelets 212 150 - 400 K/uL   nRBC 0.0 0.0 - 0.2 %    Comment: Performed at [facility], [place] [address] [place], [place] [address]  Basic metabolic panel     Status: Abnormal   Collection Time: 01/17/22  4:25 AM  Result Value Ref Range   Sodium 136 135 - 145 mmol/L   Potassium 4.0 3.5 - 5.1 mmol/L   Chloride 102 98 - 111 mmol/L   CO2 26 22 - 32 mmol/L   Glucose, Bld 248 (H) 70 - 99 mg/dL    Comment: Glucose reference range applies only to samples taken after fasting for at least 8 hours.   BUN 15 8 - 23 mg/dL   Creatinine, Ser 1.21 (H) 0.44 - 1.00 mg/dL   Calcium 8.8 (L) 8.9 - 10.3 mg/dL   GFR, Estimated 48 (L) >60 mL/min    Comment: (NOTE) Calculated using the CKD-EPI Creatinine Equation (2021)    Anion gap 8 5 - 15    Comment: Performed at [facility] [address], [place], [place] [address]  Protime-INR     Status: Abnormal   Collection Time: 01/17/22  4:25 AM  Result Value Ref Range   Prothrombin Time 18.1 (H) 11.4 - 15.2 seconds   INR 1.5 (H) 0.8 - 1.2    Comment:  (NOTE) INR goal varies based on device and disease states. Performed at [facility], [address] [place], [place] [address]    *Note: Due to a large number of results and/or encounters for the requested time period, some results have not been displayed. A complete set of results can be found in Results Review.   DG Chest 1 View  Result Date: 01/16/2022 CLINICAL DATA:  Chest pain.  Generalized weakness. EXAM: CHEST  1 VIEW COMPARISON:  08/16/2021 FINDINGS: Previous median sternotomy and CABG procedure. Aortic atherosclerosis. Stable mild cardiac enlargement. Slight asymmetric elevation of the left hemidiaphragm. Blunting of the left costophrenic angle is identified which is favored to represent pleuroparenchymal scarring. No pleural fluid or airspace consolidation. Multiple right posterior and lateral rib fracture deformities are identified which appear unchanged from previous exam. IMPRESSION: 1. No acute cardiopulmonary abnormalities. 2. Chronic right posterior and lateral rib fracture deformities. 3. Pleural parenchymal scarring within the left lung base. Electronically Signed   By: Kerby Moors M.D.   On: 01/16/2022 15:08    Pending Labs Unresulted Labs (From admission, onward)     Start     Ordered   01/17/22 0500  Protime-INR  Daily,   R      01/16/22 2144            Vitals/Pain Today's Vitals   01/17/22 0814 01/17/22 0814 01/17/22 0912 01/17/22 0915  BP:   (!) 149/52 (!) 149/52  Pulse:   72 78  Resp:    15  Temp: 98.1 F (36.7 C)     TempSrc: Oral     SpO2:    93%  PainSc:  8       Isolation Precautions No active isolations  Medications Medications  ALPRAZolam (XANAX) tablet 0.5 mg (0.5 mg Oral Given 01/17/22 0203)  buPROPion [facility]) tablet 150 mg (has no administration in time range)  loratadine (CLARITIN) tablet 10 mg (10 mg Oral Given 01/17/22 0910)  DULoxetine (CYMBALTA) DR capsule 60 mg (60 mg Oral Given 01/17/22 0910)   HYDROcodone-acetaminophen (NORCO/VICODIN) 5-325 MG per tablet 1-2 tablet (2 tablets Oral Given 01/17/22 0829)  metoprolol succinate (TOPROL-XL) 24 hr tablet 50 mg (50 mg Oral Given 01/17/22 0912)  rosuvastatin (CRESTOR) tablet 20 mg (20 mg Oral Given 01/17/22 0911)  ipratropium-albuterol (DUONEB) 0.5-2.5 (3) MG/3ML nebulizer solution 3 mL (has no administration in time range)  gabapentin (NEURONTIN) capsule 600 mg (600 mg Oral Given 01/16/22 2253)    And  gabapentin (NEURONTIN) capsule 300 mg (300 mg Oral Given 01/17/22 0911)  nicotine (NICODERM CQ - dosed in mg/24 hours) patch 14 mg (14 mg Transdermal Patch Applied 01/17/22 0919)  fluticasone (FLONASE) 50 MCG/ACT nasal spray 2 spray (has no administration in time range)  mometasone-formoterol (DULERA) 200-5 MCG/ACT inhaler 2 puff (has no administration in time range)  umeclidinium bromide (INCRUSE ELLIPTA) 62.5 MCG/ACT 1 puff (has no administration in time range)  pantoprazole (PROTONIX) EC tablet 40 mg (40 mg Oral Given 01/17/22 0911)  ondansetron (ZOFRAN) injection 4 mg (4 mg Intravenous Given 01/16/22 2254)  Warfarin - Pharmacist Dosing Inpatient (has no administration in time range)  warfarin (COUMADIN) tablet 2.5 mg (has no administration in time range)  insulin glargine-yfgn (SEMGLEE) injection 43 Units (has no administration in time range)  0.9 %  sodium chloride infusion (Manually program via Guardrails IV Fluids) (0 mLs Intravenous Stopped 01/16/22 2227)  warfarin (COUMADIN) tablet 2.5 mg (2.5 mg Oral Given 01/17/22 0333)    Mobility walks with device High fall risk   Focused Assessments    R Recommendations: See Admitting Provider Note  Report given to:

## 2022-01-17 NOTE — Hospital Course (Signed)
Pleasant Still having dizziness - worsened with sudden movements Doesn't walk around much Headahce where she hit head Gets constipated with oral iron

## 2022-01-17 NOTE — Progress Notes (Signed)
HD#0 Subjective:   Summary: [name] is a 71 y.o. female with PMH of multiple falls,worsening of her recurrent iron deficient anemia,  T2DM c/b neuropathy,CKD3a,COPD ,chronic multifactorial generalized pain , PAD, HFpEF, MDD who presents with dizziness and multiple falls.  Overnight Events: none  Patient still reports dizziness with sudden movements. Believes dizziness was prior to last fall a few days ago. Still has headache and nausea.   Objective:  Vital signs in last 24 hours: Vitals:   01/17/22 0300 01/17/22 0416 01/17/22 0425 01/17/22 0603  BP: 121/61 (!) 155/49 131/81 127/77  Pulse: 79 76 74 79  Resp: _0 Temp: 97.8 F (36.6 C) 97.6 F (36.4 C)    TempSrc: Oral Oral    SpO2: 98% 93% 93% 98%   Supplemental O2: Nasal Cannula SpO2: 98 % O2 Flow Rate (L/min): 2 L/min   Physical Exam:  Constitutional: sitting in bed asleep, in no acute distress HENT: normocephalic  Neck: supple Cardiovascular: regular rate and rhythm Pulmonary/Chest: normal work of breathing on MSK: normal bulk and tone Neurological: alert & oriented x 3, no focal deficits  Skin: warm and dry Psych: pleasant mood  There were no vitals filed for this visit.   Intake/Output Summary (Last 24 hours) at 01/17/2022 0737 Last data filed at 01/17/2022 0415 Gross per 24 hour  Intake 325 ml  Output --  Net 325 ml   Net IO Since Admission: 325 mL [01/17/22 0737]  Pertinent Labs:    Latest Ref Rng & Units 01/17/2022    4:25 AM 01/16/2022    2:19 PM 01/15/2022    4:53 AM  CBC  WBC 4.0 - 10.5 K/uL 7.0  9.1  7.0   Hemoglobin 12.0 - 15.0 g/dL 9.1  8.3  8.0   Hematocrit 36.0 - 46.0 % 32.1  31.4  28.2   Platelets 150 - 400 K/uL 212  278  214        Latest Ref Rng & Units 01/17/2022    4:25 AM 01/16/2022    2:34 PM 01/15/2022    4:53 AM  CMP  Glucose 70 - 99 mg/dL 248  155  157   BUN 8 - 23 mg/dL _1 Creatinine 0.44 - 1.00 mg/dL 1.21  1.06  1.29   Sodium 135 - 145  mmol/L 136  138  141   Potassium 3.5 - 5.1 mmol/L 4.0  4.9  3.9   Chloride 98 - 111 mmol/L 102  107  100   CO2 22 - 32 mmol/L _2 Calcium 8.9 - 10.3 mg/dL 8.8  9.2  10.4   Total Protein 6.5 - 8.1 g/dL  6.5  6.2   Total Bilirubin 0.3 - 1.2 mg/dL  0.4  0.4   Alkaline Phos 38 - 126 U/L  50  51   AST 15 - 41 U/L  21  15   ALT 0 - 44 U/L  13  11     Imaging: DG Chest 1 View  Result Date: 01/16/2022 CLINICAL DATA:  Chest pain.  Generalized weakness. EXAM: CHEST  1 VIEW COMPARISON:  08/16/2021 FINDINGS: Previous median sternotomy and CABG procedure. Aortic atherosclerosis. Stable mild cardiac enlargement. Slight asymmetric elevation of the left hemidiaphragm. Blunting of the left costophrenic angle is identified which is favored to represent pleuroparenchymal scarring. No pleural fluid or airspace consolidation. Multiple right posterior and lateral rib fracture deformities are identified which appear  unchanged from previous exam. IMPRESSION: 1. No acute cardiopulmonary abnormalities. 2. Chronic right posterior and lateral rib fracture deformities. 3. Pleural parenchymal scarring within the left lung base. Electronically Signed   By: Kerby Moors M.D.   On: 01/16/2022 15:08    Assessment/Plan:   Principal Problem:   Frequent falls   Patient Summary: [name] is a 71 y.o. with a pertinent PMH of multiple falls,worsening of her recurrent iron deficient anemia,  T2DM c/b neuropathy,CKD3a,COPD ,chronic multifactorial generalized pain , PAD, HFpEF , MDD who presents with dizziness and multiple falls and admitted for IDA.   Dizziness IDA S/p 1 unit of PRBC, Hgb improved to 9.1. Still has dizziness with sudden movements. Concern for posterior stroke and obtained brain MRI. Negative for posterior CVA but incidentally found acute punctate infarct involving medial cortex of right frontal lobe. Neuro exam was benign. Consulted neurology and will follow, patient can continue with  warfarin and no additional antiplatelet therapy needed. Upon chart review, appears she had infusion reaction and not allergic reaction to Ferrlecit. Plan is to trial another IV iron such as Feraheme which she has tolerated in the past.  -PT vestibular eval -PT/OT consult -trial Feraheme tomorrow -CBC  Acute Right Frontal CVA -see above for details -appreciate neurology assistance  -rosuvastatin 20 mg daily -can continue warfarin   T2DM with peripheral neuropathy PTA on Tresiba 43 units daily. A1c 7.1%.  -Semglee 43 units daily  CKD3a Baseline creatinine 1.0-1.2, this morning at 1.21.  COPD -continue home Trelegy  -duonebs PRN   HTN HFpEF -holding home lasix -continue metoprolol succinate 50 mg daily  PAD -warfarin, target INR range 1.5-2.5 from previous notes by Dr. Elie Confer  Chronic Pain -gabapentin 635m BID -Norco 5-3264m1-2 tablets TID PRN -Cymbalta 6080mID  MDD -bupropion 150m65mM -xanax 0.5 mg QHS PRN   Diet: Carb-Modified IVF: None,None VTE:  warfarin Code: Full PT/OT recs: Pending   Dispo: pending medical stability   [name] Internal Medicine Resident PGY-1 Pager: [phone] contact the on call pager after 5 pm and on weekends at [phone]

## 2022-01-18 ENCOUNTER — Observation Stay (HOSPITAL_BASED_OUTPATIENT_CLINIC_OR_DEPARTMENT_OTHER): Payer: HMO

## 2022-01-18 ENCOUNTER — Observation Stay (HOSPITAL_COMMUNITY): Payer: HMO

## 2022-01-18 DIAGNOSIS — R42 Dizziness and giddiness: Secondary | ICD-10-CM | POA: Diagnosis not present

## 2022-01-18 DIAGNOSIS — I639 Cerebral infarction, unspecified: Secondary | ICD-10-CM

## 2022-01-18 DIAGNOSIS — I6389 Other cerebral infarction: Secondary | ICD-10-CM

## 2022-01-18 DIAGNOSIS — I480 Paroxysmal atrial fibrillation: Secondary | ICD-10-CM

## 2022-01-18 DIAGNOSIS — R296 Repeated falls: Secondary | ICD-10-CM | POA: Diagnosis not present

## 2022-01-18 DIAGNOSIS — Z7901 Long term (current) use of anticoagulants: Secondary | ICD-10-CM

## 2022-01-18 DIAGNOSIS — R5383 Other fatigue: Secondary | ICD-10-CM | POA: Diagnosis not present

## 2022-01-18 LAB — CBC
HCT: 32.2 % — ABNORMAL LOW (ref 36.0–46.0)
Hemoglobin: 9.4 g/dL — ABNORMAL LOW (ref 12.0–15.0)
MCH: 22.2 pg — ABNORMAL LOW (ref 26.0–34.0)
MCHC: 29.2 g/dL — ABNORMAL LOW (ref 30.0–36.0)
MCV: 76.1 fL — ABNORMAL LOW (ref 80.0–100.0)
Platelets: 204 10*3/uL (ref 150–400)
RBC: 4.23 MIL/uL (ref 3.87–5.11)
RDW: 18.6 % — ABNORMAL HIGH (ref 11.5–15.5)
WBC: 8 10*3/uL (ref 4.0–10.5)
nRBC: 0 % (ref 0.0–0.2)

## 2022-01-18 LAB — LIPID PANEL
Cholesterol: 102 mg/dL (ref 0–200)
HDL: 32 mg/dL — ABNORMAL LOW (ref >40)
LDL Cholesterol: 40 mg/dL (ref 0–99)
Total CHOL/HDL Ratio: 3.2 RATIO
Triglycerides: 148 mg/dL (ref <150)
VLDL: 30 mg/dL (ref 0–40)

## 2022-01-18 LAB — ECHOCARDIOGRAM COMPLETE BUBBLE STUDY
AR max vel: 1.81 cm2
AV Area VTI: 1.82 cm2
AV Area mean vel: 1.74 cm2
AV Mean grad: 9 mmHg
AV Peak grad: 17.8 mmHg
Ao pk vel: 2.11 m/s
Area-P 1/2: 3.4 cm2
MV VTI: 1.82 cm2
S' Lateral: 2.4 cm

## 2022-01-18 LAB — BASIC METABOLIC PANEL
Anion gap: 7 (ref 5–15)
BUN: 11 mg/dL (ref 8–23)
CO2: 28 mmol/L (ref 22–32)
Calcium: 9.2 mg/dL (ref 8.9–10.3)
Chloride: 105 mmol/L (ref 98–111)
Creatinine, Ser: 1.2 mg/dL — ABNORMAL HIGH (ref 0.44–1.00)
GFR, Estimated: 48 mL/min — ABNORMAL LOW (ref >60)
Glucose, Bld: 131 mg/dL — ABNORMAL HIGH (ref 70–99)
Potassium: 4.4 mmol/L (ref 3.5–5.1)
Sodium: 140 mmol/L (ref 135–145)

## 2022-01-18 LAB — TYPE AND SCREEN
ABO/RH(D): A NEG
Antibody Screen: NEGATIVE
Unit division: 0

## 2022-01-18 LAB — BPAM RBC
Blood Product Expiration Date: 202312252359
ISSUE DATE / TIME: 202312080149
Unit Type and Rh: 600

## 2022-01-18 LAB — GLUCOSE, CAPILLARY
Glucose-Capillary: 250 mg/dL — ABNORMAL HIGH (ref 70–99)
Glucose-Capillary: 258 mg/dL — ABNORMAL HIGH (ref 70–99)

## 2022-01-18 LAB — PROTIME-INR
INR: 1.7 — ABNORMAL HIGH (ref 0.8–1.2)
Prothrombin Time: 19.8 seconds — ABNORMAL HIGH (ref 11.4–15.2)

## 2022-01-18 MED ORDER — SODIUM CHLORIDE 0.9 % IV SOLN
510.0000 mg | Freq: Once | INTRAVENOUS | Status: AC
  Administered 2022-01-18: 510 mg via INTRAVENOUS
  Filled 2022-01-18: qty 17

## 2022-01-18 MED ORDER — INSULIN ASPART 100 UNIT/ML IJ SOLN
0.0000 [IU] | Freq: Every day | INTRAMUSCULAR | Status: DC
  Administered 2022-01-18: 2 [IU] via SUBCUTANEOUS

## 2022-01-18 MED ORDER — MUSCLE RUB 10-15 % EX CREA
TOPICAL_CREAM | CUTANEOUS | Status: DC | PRN
  Filled 2022-01-18: qty 85

## 2022-01-18 MED ORDER — PREDNISONE 20 MG PO TABS
50.0000 mg | ORAL_TABLET | Freq: Every day | ORAL | Status: DC
  Administered 2022-01-18 – 2022-01-19 (×2): 50 mg via ORAL
  Filled 2022-01-18 (×3): qty 1

## 2022-01-18 MED ORDER — INSULIN ASPART 100 UNIT/ML IJ SOLN
0.0000 [IU] | Freq: Three times a day (TID) | INTRAMUSCULAR | Status: DC
  Administered 2022-01-18: 5 [IU] via SUBCUTANEOUS

## 2022-01-18 MED ORDER — WARFARIN - PHARMACIST DOSING INPATIENT: Freq: Every day | Status: DC

## 2022-01-18 MED ORDER — INSULIN ASPART 100 UNIT/ML IJ SOLN
0.0000 [IU] | Freq: Three times a day (TID) | INTRAMUSCULAR | Status: DC
  Administered 2022-01-19: 8 [IU] via SUBCUTANEOUS
  Administered 2022-01-19: 3 [IU] via SUBCUTANEOUS
  Administered 2022-01-19: 8 [IU] via SUBCUTANEOUS

## 2022-01-18 MED ORDER — WARFARIN SODIUM 2 MG PO TABS
2.0000 mg | ORAL_TABLET | Freq: Once | ORAL | Status: AC
  Administered 2022-01-18: 2 mg via ORAL
  Filled 2022-01-18: qty 1

## 2022-01-18 MED ORDER — SODIUM CHLORIDE 0.9 % IV SOLN: 250.0000 mg | Freq: Every day | INTRAVENOUS | Status: DC

## 2022-01-18 MED ORDER — MECLIZINE HCL 25 MG PO TABS
25.0000 mg | ORAL_TABLET | Freq: Three times a day (TID) | ORAL | Status: DC | PRN
  Administered 2022-01-18 – 2022-01-19 (×2): 25 mg via ORAL
  Filled 2022-01-18 (×4): qty 1

## 2022-01-18 MED ORDER — WARFARIN 1.25 MG HALF TABLET
1.2500 mg | ORAL_TABLET | Freq: Once | ORAL | Status: DC
  Filled 2022-01-18: qty 1

## 2022-01-18 NOTE — Evaluation (Signed)
Physical Therapy Evaluation Patient Details Name: [name] MRN: [id number] DOB: 1950-12-04 Today's Date: 01/18/2022  History of Present Illness  71 y.o. female with PMH significant for falls, DM2, CKD 3, COPD, PAD, MDD, HFpEF, hx of RCC s/p cryoablation in 2013 and 2022, porcine MV and pAfibb on warfarin who is admitted with multiple falls, dizziness. MRI brain:  small punctate medial R frontal lobe.  Clinical Impression  Pt was seen for mobilty on RW bedside after intially seeing some mild R eye nystagmus.  Have requested for Vestibular therapist to see next visit to confirm the findings, although pt is potentially having milder hypofunction.  Pt has reported to MD that meds are helpful and agreed to continue them.  Recommending her to have HHPT follow her after acute stay to work on chronic issues of balance, to increase static and dynamic balance control and to work on her mobility with longer surfaces as well as stairs.  Follow for goals as outlined below.      Recommendations for follow up therapy are one component of a multi-disciplinary discharge planning process, led by the attending physician.  Recommendations may be updated based on patient status, additional functional criteria and insurance authorization.  Follow Up Recommendations Home health PT      Assistance Recommended at Discharge Intermittent Supervision/Assistance  Patient can return home with the following  A little help with walking and/or transfers;A little help with bathing/dressing/bathroom;Assistance with cooking/housework;Assist for transportation    Equipment Recommendations None recommended by PT  Recommendations for Other Services       Functional Status Assessment Patient has had a recent decline in their functional status and demonstrates the ability to make significant improvements in function in a reasonable and predictable amount of time.     Precautions / Restrictions  Precautions Precautions: Fall Precaution Comments: monitor for vestibular symptoms Restrictions Weight Bearing Restrictions: No      Mobility  Bed Mobility Overal bed mobility: Needs Assistance Bed Mobility: Supine to Sit     Supine to sit: Supervision     General bed mobility comments: using bed rail minimally    Transfers Overall transfer level: Needs assistance Equipment used: Rolling walker (2 wheels) Transfers: Sit to/from Stand Sit to Stand: Min guard           General transfer comment: min guard as pt has moments of LOB upon standing    Ambulation/Gait Ambulation/Gait assistance: Min guard Gait Distance (Feet): 20 Feet Assistive device: Rolling walker (2 wheels) Gait Pattern/deviations: Step-to pattern, Decreased stride length Gait velocity: reduced Gait velocity interpretation: <1.31 ft/sec, indicative of household ambulator Pre-gait activities: standing balance ck General Gait Details: sidesteps on side of bed due to pt fatigue and concern to stimulate dizziness  Stairs            Wheelchair Mobility    Modified Rankin (Stroke Patients Only)       Balance Overall balance assessment: Needs assistance Sitting-balance support: Feet supported Sitting balance-Leahy Scale: Good     Standing balance support: Bilateral upper extremity supported, During functional activity Standing balance-Leahy Scale: Poor Standing balance comment: requries AD today                             Pertinent Vitals/Pain Pain Assessment Pain Assessment: No/denies pain    Home Living Family/patient expects to be discharged to:: Private residence Living Arrangements: Alone Available Help at Discharge: Family;Available PRN/intermittently Type of Home: House Home Access:  Stairs to enter Entrance Stairs-Rails: None Entrance Stairs-Number of Steps: 1   Home Layout: One level Home Equipment: Rollator (4 wheels);BSC/3in1      Prior Function Prior  Level of Function : Independent/Modified Independent             Mobility Comments: using a rollator and at times no AD per pt       Hand Dominance   Dominant Hand: Right    Extremity/Trunk Assessment   Upper Extremity Assessment Upper Extremity Assessment: Defer to OT evaluation    Lower Extremity Assessment Lower Extremity Assessment: Overall WFL for tasks assessed    Cervical / Trunk Assessment Cervical / Trunk Assessment: Kyphotic  Communication   Communication: No difficulties  Cognition Arousal/Alertness: Awake/alert Behavior During Therapy: WFL for tasks assessed/performed Overall Cognitive Status: Within Functional Limits for tasks assessed                                 General Comments: pt is concerned for her dizziness to limit her independence        General Comments General comments (skin integrity, edema, etc.): Pt is up to side of bed when PT arrives and is demonstrating a controlled effort to balance and manage mild dizzy symptoms    Exercises     Assessment/Plan    PT Assessment Patient needs continued PT services  PT Problem List Decreased activity tolerance;Decreased balance;Decreased safety awareness       PT Treatment Interventions DME instruction;Gait training;Stair training;Functional mobility training;Therapeutic activities;Therapeutic exercise;Balance training;Neuromuscular re-education;Patient/family education    PT Goals (Current goals can be found in the Care Plan section)  Acute Rehab PT Goals Patient Stated Goal: to go home and feel better PT Goal Formulation: With patient Time For Goal Achievement: 02/01/22 Potential to Achieve Goals: Good    Frequency Min 3X/week     Co-evaluation               AM-PAC PT "6 Clicks" Mobility  Outcome Measure Help needed turning from your back to your side while in a flat bed without using bedrails?: None Help needed moving from lying on your back to sitting on the  side of a flat bed without using bedrails?: None Help needed moving to and from a bed to a chair (including a wheelchair)?: A Little Help needed standing up from a chair using your arms (e.g., wheelchair or bedside chair)?: A Little Help needed to walk in hospital room?: A Little Help needed climbing 3-5 steps with a railing? : A Lot 6 Click Score: 19    End of Session Equipment Utilized During Treatment: Gait belt Activity Tolerance: Patient limited by fatigue;Treatment limited secondary to medical complications (Comment) Patient left: in bed;with call bell/phone within reach (sitting on side of bed where nursing had left her) Nurse Communication: Mobility status PT Visit Diagnosis: Unsteadiness on feet (R26.81);History of falling (Z91.81);Difficulty in walking, not elsewhere classified (R26.2)    Time: 3151-7616 PT Time Calculation (min) (ACUTE ONLY): 20 min   Charges:   PT Evaluation $PT Eval Moderate Complexity: 1 Mod         Ramond Dial 01/18/2022, 8:05 PM  Mee Hives, PT PhD Acute Rehab Dept. Number: [place] [facility] [place]

## 2022-01-18 NOTE — Progress Notes (Addendum)
[place] for Warfarin Indication: atrial fibrillation  Allergies  Allergen Reactions   Dexilant [Dexlansoprazole] Other (See Comments)    Made acid reflux worse   Lorazepam Other (See Comments)    Patient's sister noted that ativan caused the patient to become extremely confused during hospitalization 09/2010; tolerates Xanax   Oxycontin [Oxycodone] Other (See Comments)    headache   Tramadol Hcl Swelling    Ankle swelling    Patient Measurements: Height: _0  (157.5 cm) Weight: 73.2 kg (161 lb 6.4 oz) IBW/kg (Calculated) : 50.1  Vital Signs: Temp: 97.9 F (36.6 C) (12/08 2336) Temp Source: Oral (12/08 2336) BP: 166/62 (12/09 0125) Pulse Rate: 66 (12/09 0125)  Labs: Recent Labs    01/16/22 1419 01/16/22 1434 01/16/22 1832 01/17/22 0425 01/18/22 0347  HGB 8.3*  --   --  9.1* 9.4*  HCT 31.4*  --   --  32.1* 32.2*  PLT 278  --   --  212 204  LABPROT 17.2*  --   --  18.1* 19.8*  INR 1.4*  --   --  1.5* 1.7*  CREATININE  --  1.06*  --  1.21* 1.20*  TROPONINIHS  --  9 8  --   --     Estimated Creatinine Clearance: 39.7 mL/min (A) (by C-G formula based on SCr of 1.2 mg/dL (H)).  Medications:  Scheduled:   buPROPion  150 mg Oral Daily   DULoxetine  60 mg Oral BID   fluticasone  2 spray Each Nare Daily   gabapentin  600 mg Oral QHS   And   gabapentin  300 mg Oral Daily   insulin glargine-yfgn  43 Units Subcutaneous Daily   loratadine  10 mg Oral Daily   metoprolol succinate  50 mg Oral Daily   nicotine  14 mg Transdermal Daily   pantoprazole  40 mg Oral BID   predniSONE  50 mg Oral Q breakfast   rosuvastatin  20 mg Oral Daily   Warfarin - Pharmacist Dosing Inpatient   Does not apply q1600   Assessment: 41 YOF presenting for symptomatic anemia with history of long-standing iron deficiency anemia. On warfarin PTA for atrial fibrillation. PTA regimen: 2.57m Wed/Fri/Sun; 1.218mTu/Thur/Sat, no dose on Mondays. INR today is  within goal at 1.7. Hgb 9.4 stable s/p blood transfusion on 12/7. Platelets stable in 200s.   Goal of Therapy:  INR goal 1.5-2.5 per chart review and confirmation from IMTS  Monitor platelets by anticoagulation protocol: Yes   Plan:  Will give home dose of 1.25 mg x1 Daily INR and monitoring for signs/symptoms of bleeding  KaJeneen RinksPharm.D PGY1 Pharmacy Resident 01/18/2022 8:11 AM  =========================================================== Addendum: Patient not interested in starting DOAC without discussion with PCP. Patient had INR goal of 1.5 to 2.5 outpatient due to history of GI bleed and anemia.Team would like to increase the INR goal to 2-2.5. INR has been slowly increasing since 12/7.   Plan: Will give 2 mg warfarin po x1 tonight Daily INR and monitor for signs/symptoms of bleeding.   KaJeneen RinksPharm.D PGY1 Pharmacy Resident 01/18/2022 2:16 PM

## 2022-01-18 NOTE — Progress Notes (Signed)
HD#0 Subjective:   Summary: Latresa Gasser is a 71 y.o. female with PMH of multiple falls,worsening of her recurrent iron deficient anemia,  T2DM c/b neuropathy,CKD3a,COPD ,chronic multifactorial generalized pain , PAD, HFpEF, MDD who presents with dizziness and multiple falls.   Overnight Events: No acute events overnight   Patient resting in recliner comfortably in no acute distress. Denies dizziness this morning, feels as though meclizine helped significantly. Discussed plan today for IV iron and further evaluation by physical therapy and neurology. Patient concerned about carb modified diet and not receiving meal that she ordered.   Objective:  Vital signs in last 24 hours: Vitals:   01/17/22 1625 01/17/22 2336 01/18/22 0125 01/18/22 0838  BP: (!) 177/53 (!) 172/60 (!) 166/62 (!) 185/50  Pulse: 72 75 66 60  Resp: _0 Temp:  97.9 F (36.6 C)  98 F (36.7 C)  TempSrc:  Oral  Oral  SpO2: 94% 95%  97%  Weight:      Height:       Supplemental O2: None, room air  Physical Exam:  Constitutional: no acute distress HENT: normocephalic atraumatic Eyes: conjunctiva non-erythematous Neck: supple Pulmonary/Chest: normal work of breathing on room air MSK: normal bulk and tone Neurological: alert & oriented x 3 Psych: Pleasant  Filed Weights   01/17/22 1342  Weight: 73.2 kg     Intake/Output Summary (Last 24 hours) at 01/18/2022 1336 Last data filed at 01/18/2022 1011 Gross per 24 hour  Intake 480 ml  Output 550 ml  Net -70 ml   Net IO Since Admission: 255 mL [01/18/22 1336]  Pertinent Labs:    Latest Ref Rng & Units 01/18/2022    3:47 AM 01/17/2022    4:25 AM 01/16/2022    2:19 PM  CBC  WBC 4.0 - 10.5 K/uL 8.0  7.0  9.1   Hemoglobin 12.0 - 15.0 g/dL 9.4  9.1  8.3   Hematocrit 36.0 - 46.0 % 32.2  32.1  31.4   Platelets 150 - 400 K/uL 204  212  278        Latest Ref Rng & Units 01/18/2022    3:47 AM 01/17/2022    4:25 AM 01/16/2022    2:34 PM  CMP   Glucose 70 - 99 mg/dL 131  248  155   BUN 8 - 23 mg/dL _1 Creatinine 0.44 - 1.00 mg/dL 1.20  1.21  1.06   Sodium 135 - 145 mmol/L 140  136  138   Potassium 3.5 - 5.1 mmol/L 4.4  4.0  4.9   Chloride 98 - 111 mmol/L 105  102  107   CO2 22 - 32 mmol/L _2 Calcium 8.9 - 10.3 mg/dL 9.2  8.8  9.2   Total Protein 6.5 - 8.1 g/dL   6.5   Total Bilirubin 0.3 - 1.2 mg/dL   0.4   Alkaline Phos 38 - 126 U/L   50   AST 15 - 41 U/L   21   ALT 0 - 44 U/L   13     Imaging: VAS US CAROTID  Result Date: 01/18/2022 Carotid Arterial Duplex Study Patient Name:  MAGGIE SENSENEY [facility]  Date of Exam:   01/18/2022 Medical Rec #: [id number]               Accession #:    [id number] Date of Birth: 1950-12-26  Patient Gender: F Patient Age:   1 years Exam Location:  [facility] Procedure:      VAS US CAROTID Referring Phys: ERIK HOFFMAN --------------------------------------------------------------------------------  Indications:       CVA. Limitations        Today's exam was limited due to the patient's respiratory                    variation. Patient somnolence with wheezing. Comparison Study:  08-05-2021 Prior carotid duplex showed 1-39% ICA stenosis                    bilaterally. Elevated PSV throughout the carotid system on                    previous examinations. Performing Technologist: Darlin Coco RDMS, RVT  Examination Guidelines: A complete evaluation includes B-mode imaging, spectral Doppler, color Doppler, and power Doppler as needed of all accessible portions of each vessel. Bilateral testing is considered an integral part of a complete examination. Limited examinations for reoccurring indications may be performed as noted.  Right Carotid Findings: +----------+--------+--------+--------+------------------+--------+           PSV cm/sEDV cm/sStenosisPlaque DescriptionComments +----------+--------+--------+--------+------------------+--------+ CCA Prox  118      10      <50%    heterogenous               +----------+--------+--------+--------+------------------+--------+ CCA Distal140     11                                         +----------+--------+--------+--------+------------------+--------+ ICA Prox  160     19      1-39%   heterogenous               +----------+--------+--------+--------+------------------+--------+ ICA Mid   133     13                                         +----------+--------+--------+--------+------------------+--------+ ICA Distal143     19                                         +----------+--------+--------+--------+------------------+--------+ ECA       163                                                +----------+--------+--------+--------+------------------+--------+ +----------+--------+-------+----------------+-------------------+           PSV cm/sEDV cmsDescribe        Arm Pressure (mmHG) +----------+--------+-------+----------------+-------------------+ [id number]            Multiphasic, WNL                    +----------+--------+-------+----------------+-------------------+ +---------+--------+--+--------+-+---------+ VertebralPSV cm/s76EDV cm/s8Antegrade +---------+--------+--+--------+-+---------+  Left Carotid Findings: +----------+--------+--------+--------+------------------+--------+           PSV cm/sEDV cm/sStenosisPlaque DescriptionComments +----------+--------+--------+--------+------------------+--------+ CCA Prox  141     17                                         +----------+--------+--------+--------+------------------+--------+  CCA Distal100     13                                         +----------+--------+--------+--------+------------------+--------+ ICA Prox  157     21      1-39%   heterogenous               +----------+--------+--------+--------+------------------+--------+ ICA Mid   162     27                                          +----------+--------+--------+--------+------------------+--------+ ICA Distal141     23                                         +----------+--------+--------+--------+------------------+--------+ ECA       356     37      >50%                               +----------+--------+--------+--------+------------------+--------+ +----------+--------+--------+----------------+-------------------+           PSV cm/sEDV cm/sDescribe        Arm Pressure (mmHG) +----------+--------+--------+----------------+-------------------+ [id number]             Multiphasic, WNL                    +----------+--------+--------+----------------+-------------------+ +---------+--------+---+--------+--+---------+ VertebralPSV cm/s107EDV cm/s17Antegrade +---------+--------+---+--------+--+---------+   Summary: Right Carotid: Velocities in the right ICA are consistent with a 1-39% stenosis.                Non-hemodynamically significant plaque <50% noted in the CCA. Left Carotid: Velocities in the left ICA are consistent with a 1-39% stenosis.               The ECA appears >50% stenosed. Vertebrals:  Bilateral vertebral arteries demonstrate antegrade flow. Subclavians: Normal flow hemodynamics were seen in bilateral subclavian              arteries. *See table(s) above for measurements and observations.     Preliminary    MR ANGIO HEAD WO CONTRAST  Result Date: 01/18/2022 CLINICAL DATA:  Neuro deficit, acute, stroke suspected. Acute punctate infarction seen in medial right frontal lobe by MRI yesterday. EXAM: MRA HEAD WITHOUT CONTRAST TECHNIQUE: Angiographic images of the Circle of Willis were acquired using MRA technique without intravenous contrast. COMPARISON:  MRI 01/17/2022 FINDINGS: The study suffers from motion degradation because of patient's somnolence. Limited detail. Anterior circulation: From a can be a stab list at both internal carotid arteries are patent through the  skull base to the circle-of-Willis. Flow is present in both anterior and middle cerebral arteries. Patent posterior communicating artery at least on the right. Posterior circulation: Both vertebral arteries are patent through the foramen magnum to the basilar. Basilar artery shows flow. Flow seen in both superior cerebellar and posterior cerebral arteries. Anatomic variants: None other appreciable. Other: None IMPRESSION: Severely motion degraded study showing flow within the major anterior and posterior circulation vessels. No reliable detailed data beyond that. Electronically Signed   By: Nelson Chimes M.D.   On: 01/18/2022 11:10    Assessment/Plan:   Principal  Problem:   Frequent falls Active Problems:   Vertigo   Acute CVA (cerebrovascular accident) [facility])   Patient Summary: Zamyra Allensworth is a 71 y.o. with a pertinent PMH of multiple falls,worsening of her recurrent iron deficient anemia,  T2DM c/b neuropathy,CKD3a,COPD ,chronic multifactorial generalized pain , PAD, HFpEF , MDD who presents with dizziness and multiple falls and admitted for IDA, new finding of frontal infarct, and suspected vertigo  Dizziness multifactorial Patient denies further dizziness after starting prednisone and PRN meclizine. With periodic nature of dizziness, negative imaging for central etiology, and improvement with meds, suspect peripheral cause such as BPPV. Other chronic conditions such as chronic anemia likely also contributing. Glad to hear she is having improvement, awaiting vestibular PT during admission. -continue meclizine PRN and prednisone taper: 32m daily for 5 days, then 416mon day 6, 3069mn day 7, 72m11m day 8, 10mg28mday 9, 5mg o43may 10 -PT vestibular eval during admission -feraheme today for anemia  Iron deficiency anemia No evidence of GI etiology. Was evaluated in ED prior to admission and had two negative hemoccults. Likely slow chronic GI bleed and inadequate iron repletion. She  has a history of infusion reaction but does not appear as though has allergic reaction to IV iron. Will proceed with IV infusion of feraheme which she has tolerated in the past. Iron deficit of 739 mg -one dose IV feraheme -daily CBC  Acute right frontal CVA Asymptomatic and incidentally found during on MRI. Neurological exam yesterday evening unremarkable, pending PT evaluation. MRA normal but limited by motion. US carKoreaid US reaKoreapending. Echo pending. LDL 40 at goal. Last A1c 6.6%. BP elevated, will allow permissive hypertension, continue home metoprolol. In regards to anticoagulation, neurology recommending DOAC, however, patient would rather discuss outpatient. Increase INR goal to 2-2.5, monitor for signs of GI bleed or worsening hemoglobin -appreciate neurology, PT,OT assistance -workup pending as per above -new INR goal of 2-2.5. -PT eval pending, OT have signed off and do not recommend any equipment  T2DM with peripheral neuropathy PTA on Tresiba 43 units daily. A1c 7.1%.  -Semglee 43 units daily -Add SSI with starting prednisone   CKD3a Baseline creatinine 1.0-1.2, at baseline   COPD -continue home Trelegy  -duonebs PRN    HTN HFpEF -continue metoprolol succinate 50 mg daily -restart lasix tomorrow. No evidence of hypervolemia today   Paroxysmal atrial fibrillation -warfarin, increase target range to 2-2.5. Prior INR goal of 1.5-2.5, hopeful higher end won't lead to worsening anemia -continue metoprolol   Chronic Pain Continue home meds -gabapentin 600mg B19mNorco 5-325mg 1-6mblets TID PRN -Cymbalta 60mg BID89mDD -bupropion 150mg qAM 37max 0.5 mg QHS PRN  Diet: Normal IVF: None,None VTE:  coumadin Code: Full PT/OT recs: Pending, .  Dispo: Anticipated discharge in 2-3 days pending further IV iron, CVA work up and PT evaluation  Vasili KatCrystal CityMedicine Resident PGY-3 Please contact the on call pager after 5 pm and on weekends at  [phone]

## 2022-01-18 NOTE — Progress Notes (Signed)
Carotid duplex bilateral study completed.   Please see CV Proc for preliminary results.   Sarissa Dern, RDMS, RVT  

## 2022-01-18 NOTE — Progress Notes (Signed)
*  PRELIMINARY RESULTS* Echocardiogram 2D Echocardiogram has been performed.  Elpidio Anis 01/18/2022, 12:23 PM

## 2022-01-18 NOTE — Consult Note (Signed)
NEUROLOGY CONSULTATION NOTE   Date of service: January 18, 2022 Patient Name: Ravleen Ries MRN:  [id number] DOB:  04-15-1950 Reason for consult: "punctate stroke" Requesting Provider: Lucious Groves, DO _ _ _   _ __   _ __ _ _  __ __   _ __   __ _  History of Present Illness  Daija Maryagnes Carrasco is a 71 y.o. female with PMH significant for falls, DM2, CKD 3, COPD, PAD, MDD, HFpEF, hx of RCC s/p cryoablation in 2013 and 2022, porcine MV and pAfibb on warfarin who is admitted with multiple falls, dizziness with head movement and anemia.  She had MRI of her brain to look for central causes of her dizziness and was notable for a small punctate medial R frontal lobe cortical stroke. No stroke in the posterior circulation however.  Patient reports that she fell about 4 days ago and almost immediately started having vertigo. This is short lived and goes away if she keeps her head still. This is specifically bad when she turns her head to the extreme right. She reports neck soreness and neck pain.  Endorses dizziness in the past but that is more like pass out dizziness. Over the last 4 days, has been more like everything going round.  No tinnitus, no ear fullness, no ear pain, no hearing loss.  LKW: 01/14/22. mRS: 2 tNKASE: not offered, she is on warfarin Thrombectomy: not offered 2/2 no LVO and no significant  NIHSS components Score: Comment  1a Level of Conscious 0_0  1_1  2_2  3_3      1b LOC Questions 0_4  1_5  2_6       1c LOC Commands 0_7  1_8  2_9       2 Best Gaze 0_10  1_11  2_12       3 Visual 0_13  1_14  2_15  3_16      4 Facial Palsy 0_17  1_18  2_19  3_20      5a Motor Arm - left 0_21  1_22  2_23  3_24  4_25  UN_26    5b Motor Arm - Right 0_27  1_28  2_29  3_30  4_31  UN_32    6a Motor Leg - Left 0_33  1_34  2_35  3_36  4_37  UN_38    6b Motor Leg - Right 0_39  1_40  2_41  3_42  4_43  UN_44    7 Limb Ataxia 0_45  1_46  2_47  3_48  UN_49     8 Sensory 0_50  1_51  2_52  UN_53      9 Best Language 0_54  1_55  2_56  3_57      10 Dysarthria 0_58  1_59  2_60  UN_61       11 Extinct. and Inattention 0_62  1_63  2_64       TOTAL: 0      ROS   Constitutional Denies weight loss, fever and chills.   HEENT Denies changes in vision and hearing.   Respiratory Denies SOB and cough.   CV Denies palpitations and CP   GI Denies abdominal pain, nausea, vomiting and diarrhea.   GU Denies dysuria and urinary frequency.   MSK Denies myalgia and joint pain.   Skin Denies rash and pruritus.   Neurological Denies headache and syncope.   Psychiatric Denies recent changes in mood. Denies anxiety and depression.    Past History   Past Medical History:  Diagnosis Date   Abnormality of lung on CXR 02/02/2020   Nonspecific finding on CXR ordered by pulmonologist - c/w inflammation vs infection, f/u imaging suggested, Dr. [name] office has been in communication about recommended next steps.   Anxiety 07/24/2010   Aortic atherosclerosis ([place]) 10/19/2014   Seen on CT scan, currently asymptomatic   Arteriovenous malformation of gastrointestinal tract 08/08/2015  Non-bleeding when visualized on capsule endoscopy 06/30/2015    Arthritis    "lower back; hands" (02/19/2018)   Asthma    Asymptomatic cholelithiasis 09/25/2015   Seen on CT scan 08/2015   Carotid artery stenosis; s/p R endarterectomy    s/p right endarterectomy (06/2010) Carotid US (07/2010):  Left: Moderate-to-severe (60-79%) calcific and non-calcific plaque origin and proximal ICA and ECA    Chronic congestive heart failure with left ventricular diastolic dysfunction (HCC) 10/21/2010   Chronic constipation 02/03/2011   Chronic daily headache 01/16/2014   Chronic iron deficiency anemia    Chronic low back pain 10/06/2012   Chronic venous insufficiency 08/04/2012   Closed fracture of one rib of left side 08/23/2021   ED visit after a fall 08/16/21    COPD (chronic obstructive pulmonary disease) with emphysema ([place])    PFTs 2018: severe obstructive disease, insignif response to bronchiodilator, mild restriction  parenchymal pattern, moderately severe diffusion defect. 2014  FEV1 0.92 (40%), ratio 69, 27% increase in FEV1 with BD, TLC 91%, severe airtrapping, DLCO49% On chronic home O2. Pulmonary rehab referral 05/2012    Dyspnea    Fibromyalgia 08/29/2010   Gastroesophageal reflux disease    History of blood transfusion    "several times"  (02/19/2018)   History of clear cell renal cell carcinoma ([place]), in remission 07/21/2011   s/p cryoablation of left RCC in 09/2011 by [name]. Followed by [name]  [facility]) .     History of hiatal hernia    History of mitral valve replacement with bioprosthetic valve due to mitral stenosis 2012   s/p MVR with a 27-mm pericardial porcine valve (Medtronic Mosaic valve, serial #[id number] on 09/20/10, Dr. Prescott Gum)    History of obstructive sleep apnea, resolved 2013   resolved per sleep study 07/2019; no apnea, but did have desaturation.  CPAP no longer necessary.  Nocturnal polysomnography (06/2009): Moderate sleep apnea/ hypopnea syndrome , AHI 17.8 per hour with nonpositional hypopneas. CPAP titration to 12 CWP, AHI 2.4 per hour. On nocturnal CPAP via a small resMed Quattro full-face mask with heated humidifier.    History of pneumonia    "once"  (02/19/2018)   History of seborrheic keratosis 09/28/2015   Hyperlipidemia LDL goal < 100 11/20/2005   Internal and external hemorrhoids without complication 08/65/7846   Lesion of left native kidney 06/01/2020   Incidental finding on recent screening chest CT for lung ca ordered by Dr. Annamaria Boots pulmonology - he has ordered a f/u renal U/S.    Lichen sclerosus of female genitalia 01/12/2017   Migraine    "none in years" (02/19/2018)   Moderately severe major depression ([place]) 11/19/2005   Nocturnal hypoxia per sleep study 07/2019    Osteoporosis    DEXA 2016: T -2.7; DEXA (12/09/2011): L-spine T -3.7, left hip T -1.4 DEXA (12/2004): L-spine T -2.6, left hip -0.1    Paroxysmal atrial fibrillation ([place])  10/22/2010   s/p Left atrial maze procedure for paroxysmal atrial fibrillation on 09/20/2010 by Dr Prescott Gum.  Subsequent splenic infarct, decision was made to re-anticoagulate with coumadin, likely life-long as this is the most likely cause of the splenic infarct.    Personal history of colonic polyps 05/14/2011   Colonoscopy (05/2011): 4 mm adenomatous polyp excised endoscopically Colonoscopy (02/2002): Adenomatous polyp excised endoscopically    Personal history of renal cell carcinoma 09/12/2020   Pneumonia    Pulmonary hypertension due to chronic obstructive pulmonary disease ([place]) 04/25/2016   2014 TEE w PA peak  pressure 46 mmHg, s/p MV replacement    Right nephrolithiasis, asymptomatic, incidental finding 09/06/2014   5 mm non-obstructing calculus seen on CT scan 09/05/2014    Right ventricular failure ([place]) 04/25/2016   Severe obesity (BMI 35.0-39.9) with comorbidity ([place]) 10/23/2011   Sleep apnea    Tobacco abuse 07/28/2012   Type 2 diabetes mellitus with diabetic neuropathy [facility])    Past Surgical History:  Procedure Laterality Date   BIOPSY  07/19/2021   Procedure: BIOPSY;  Surgeon: Clarene Essex, MD;  Location: [facility];  Service: Gastroenterology;;   CARDIAC CATHETERIZATION     CARDIAC VALVE REPLACEMENT  Aug. 2012   "mitral valve"   CAROTID ENDARTERECTOMY Right 07/04/2010   by [name] for asymptomatic right carotid artery stenosis   CATARACT EXTRACTION W/ INTRAOCULAR LENS  IMPLANT, BILATERAL Bilateral    CHEST TUBE INSERTION  09/24/2010   Dr Lucianne Lei Trigt   COLONOSCOPY  05/12/2011   performed by Dr. Michail Sermon. Showing small internal hemorrhoids, single tubular adenoma polyp   COLONOSCOPY WITH PROPOFOL N/A 05/29/2020   Procedure: COLONOSCOPY WITH PROPOFOL;  Surgeon: Clarene Essex, MD;  [facility]: [facility];  Service: Endoscopy;  Laterality: N/A;   COLONOSCOPY WITH PROPOFOL N/A 07/19/2021   Procedure: COLONOSCOPY WITH PROPOFOL;  Surgeon: Clarene Essex, MD;  [facility];   Service: Gastroenterology;  Laterality: N/A;   CRYOABLATION Left 09/2011   by [name]. Followed by [name]  [facility]) .     DILATION AND CURETTAGE OF UTERUS     ESOPHAGOGASTRODUODENOSCOPY  05/12/2011   performed by Dr. Michail Sermon. Negative for ulcerations, biopsy negative for evidence of celiac sprue   ESOPHAGOGASTRODUODENOSCOPY N/A 06/29/2015   Procedure: ESOPHAGOGASTRODUODENOSCOPY (EGD);  Surgeon: Clarene Essex, MD;  Location: [facility];  Service: Endoscopy;  Laterality: N/A;   ESOPHAGOGASTRODUODENOSCOPY N/A 03/29/2016   Procedure: ESOPHAGOGASTRODUODENOSCOPY (EGD);  Surgeon: Clarene Essex, MD;  Location: [facility];  Service: Endoscopy;  Laterality: N/A;   ESOPHAGOGASTRODUODENOSCOPY (EGD) WITH PROPOFOL N/A 05/29/2020   Procedure: ESOPHAGOGASTRODUODENOSCOPY (EGD) WITH PROPOFOL;  Surgeon: Clarene Essex, MD;  [facility];  Service: Endoscopy;  Laterality: N/A;   FRACTURE SURGERY     GIVENS CAPSULE STUDY N/A 06/30/2015   Procedure: GIVENS CAPSULE STUDY;  Surgeon: Clarene Essex, MD;  Location: [place];  Service: Endoscopy;  Laterality: N/A;   GIVENS CAPSULE STUDY N/A 06/29/2015   Procedure: GIVENS CAPSULE STUDY;  Surgeon: Clarene Essex, MD;  Location: [place];  Service: Endoscopy;  Laterality: N/A;   HEMORRHOID SURGERY  1970s?   "lanced"   HYSTEROSCOPY W/ ENDOMETRIAL ABLATION  06/2001   for persistent post-menopausal bleeding // by S. [name], M.D.   IR GENERIC HISTORICAL  08/23/2015   IR RADIOLOGIST EVAL & MGMT 08/23/2015 Aletta Edouard, MD [facility] RAD   IR GENERIC HISTORICAL  04/09/2016   IR RADIOLOGIST EVAL & MGMT 04/09/2016 Aletta Edouard, MD [facility]   IR RADIOLOGIST EVAL & MGMT  10/07/2016   IR RADIOLOGIST EVAL & MGMT  06/25/2017   IR RADIOLOGIST EVAL & MGMT  07/31/2020   IR RADIOLOGIST EVAL & MGMT  10/02/2020   IR RADIOLOGIST EVAL & MGMT  12/20/2020   IR RADIOLOGIST EVAL & MGMT  12/25/2021   LEFT HEART CATH AND CORONARY ANGIOGRAPHY N/A 04/21/2016    Procedure: Left Heart Cath and Coronary Angiography;  Surgeon: Lorretta Harp, MD;  Location: [place] [facility];  Service: Cardiovascular;  Laterality: N/A;   LIPOMA EXCISION  08/2005   occipital lipoma 1.5cm - by Dr. [name]  LITHOTRIPSY  ~ 2000   MAZE Left 09/20/10   for paroxysmal atrial fibrillation (Dr. Prescott Gum)   MITRAL VALVE REPLACEMENT  09/20/10    with a 27-mm pericardial porcine valve (Medtronic Mosaic valve, serial #[id number]). 09/20/10, Dr Prescott Gum   ORIF CLAVICLE FRACTURE Right 01/2004   by Thana Farr. [name], M.D for Right clavicle nonunion.; "it's got a pin in it"   POLYPECTOMY  05/29/2020   Procedure: POLYPECTOMY;  Surgeon: Clarene Essex, MD;  [facility];  Service: Endoscopy;;   POLYPECTOMY  07/19/2021   Procedure: POLYPECTOMY;  Surgeon: Clarene Essex, MD;  [facility];  Service: Gastroenterology;;   RADIOLOGY WITH ANESTHESIA Left 09/12/2020   Procedure: RADIOLOGY WITH ANESTHESIA- RENAL CRYOABLATION;  Surgeon: Aletta Edouard, MD;  [facility];  Service: Radiology;  Laterality: Left;   REFRACTIVE SURGERY Bilateral    RIGHT HEART CATH N/A 04/23/2016   Procedure: Right Heart Cath;  Surgeon: [name], MD;  Location: [place] [facility];  Service: Cardiovascular;  Laterality: N/A;   TONSILLECTOMY     TUBAL LIGATION     Family History  Problem Relation Age of Onset   Peptic Ulcer Disease Father    Heart attack Father 69       Died of MI at age 16   Heart attack Brother 95       Died of MI at age 84   Obesity Brother    Pneumonia Mother    Healthy Sister    Lupus Daughter    Obsessive Compulsive Disorder Daughter    Social History   Socioeconomic History   Marital status: Divorced    Spouse name: Not on file   Number of children: Not on file   Years of education: Not on file   Highest education level: Not on file  Occupational History   Not on file  Tobacco Use   Smoking status: Every Day    Packs/day: 1.00    Years: 55.00    Total pack  years: 55.00    Types: Cigarettes    Start date: 11/01/1962   Smokeless tobacco: Never   Tobacco comments:    At least 1 PPD  Vaping Use   Vaping Use: Never used  Substance and Sexual Activity   Alcohol use: Not Currently    Comment: 02/19/2018 "nothing since 1999"   Drug use: Not Currently    Types: Marijuana    Comment: 02/19/2018 "nothing since the 1990s"   Sexual activity: Not Currently    Birth control/protection: Post-menopausal  Other Topics Concern   Not on file  Social History Narrative   Lives alone in [place] ([place])   Worked at [employer] for 18 years   No car   Social Determinants of Radio broadcast assistant Strain: Low Risk  (02/09/2019)   Overall Financial Resource Strain (CARDIA)    Difficulty of Paying Living Expenses: Not hard at all  Food Insecurity: No Food Insecurity (01/17/2022)   Hunger Vital Sign    Worried About Running Out of Food in the Last Year: Never true    [place] in the Last Year: Never true  Transportation Needs: No Transportation Needs (01/17/2022)   PRAPARE - Hydrologist (Medical): No    Lack of Transportation (Non-Medical): No  Physical Activity: Inactive (02/09/2019)   Exercise Vital Sign    Days of Exercise per Week: 0 days    Minutes of Exercise per Session: 0  min  Stress: No Stress Concern Present (02/09/2019)   [place]    Feeling of Stress : Only a little  Social Connections: Unknown (02/09/2019)   Social Connection and Isolation Panel [NHANES]    Frequency of Communication with Friends and Family: More than three times a week    Frequency of Social Gatherings with Friends and Family: Not on file    Attends Religious Services: Not on file    Active Member of Clubs or Organizations: Not on file    Attends Archivist Meetings: Not on file    Marital Status: Divorced   Allergies  Allergen  Reactions   Dexilant [Dexlansoprazole] Other (See Comments)    Made acid reflux worse   Lorazepam Other (See Comments)    Patient's sister noted that ativan caused the patient to become extremely confused during hospitalization 09/2010; tolerates Xanax   Oxycontin [Oxycodone] Other (See Comments)    headache   Tramadol Hcl Swelling    Ankle swelling    Medications   Medications Prior to Admission  Medication Sig Dispense Refill Last Dose   albuterol (PROAIR HFA) 108 (90 Base) MCG/ACT inhaler Inhale 2 puffs into the lungs every 6 (six) hours as needed for shortness of breath. 18 g 5 Past Week   ALPRAZolam (XANAX) 1 MG tablet Take 1/2 - 1 tablet (1/2  - 1 mg total) by mouth at bedtime as needed for anxiety or sleep. May take 1/2 tablet during the day if needed for anxiety 45 tablet 0 01/15/2022   Bacillus Coagulans-Inulin (ALIGN PREBIOTIC-PROBIOTIC PO) Take 1 capsule by mouth daily.   01/15/2022   buPROPion (WELLBUTRIN XL) 150 MG 24 hr tablet Take 1 tablet (150 mg total) by mouth every morning. 90 tablet 3 01/15/2022   cetirizine (ZYRTEC ALLERGY) 10 MG tablet Take 1 tablet (10 mg total) by mouth daily. 90 tablet 3 01/15/2022   clobetasol ointment (TEMOVATE) 0.05 % Apply to affected area(s) 2 times daily as needed for (irritation). 30 g 5 Past Month   DULoxetine (CYMBALTA) 60 MG capsule Take 1 capsule by mouth twice a day. (Patient taking differently: Take 60 mg by mouth 2 (two) times daily.) 180 capsule 3 01/15/2022   fluticasone (FLONASE) 50 MCG/ACT nasal spray Place 2 sprays into both nostrils daily. (Patient taking differently: Place 2 sprays into both nostrils at bedtime.) 54 g 3 01/15/2022   Fluticasone-Umeclidin-Vilant 100-62.5-25 MCG/ACT AEPB Inhale 1 puff into the lungs daily. 180 each 4 01/16/2022   furosemide (LASIX) 80 MG tablet Take 1 tablet by mouth every morning AND 1/2 tablet daily in the afternoon. (Patient taking differently: Take 1 tablet  80 mg by mouth every morning AND 1/2 tablet  40 mg  daily in the afternoon.) 135 tablet 3 01/16/2022   gabapentin (NEURONTIN) 300 MG capsule Take 1 capsule in the morning and 2 capsules at night (Patient taking differently: Take 600 mg by mouth See admin instructions. Take 2 capsules  (600 mg) in the afternnon and 2 capsules( 600 mg) at night) 540 capsule 3 01/16/2022   HYDROcodone-acetaminophen (NORCO/VICODIN) 5-325 MG tablet Take 1-2 tablets by mouth 3 (three) times daily. 180 tablet 0 01/16/2022 at 0900   insulin degludec (TRESIBA FLEXTOUCH) 100 UNIT/ML FlexTouch Pen Inject 43 Units into the skin daily. 12 mL 5 01/16/2022   ipratropium-albuterol (DUONEB) 0.5-2.5 (3) MG/3ML SOLN Take 3 mLs by nebulization every 6 (six) hours as needed (shortness of breath, wheezing). 90 mL 3 unk  ketoconazole (NIZORAL) 2 % cream Apply 1 application topically at bedtime as needed for irritation. 60 g 5 unk   lidocaine (LIDODERM) 5 % Place 1 or 2 patches to painful area of back each day.  Remove & Discard patch within 12 hours or as directed by MD 60 patch 2 Past Week   metoprolol succinate (TOPROL-XL) 50 MG 24 hr tablet Take 1 tablet by mouth daily. Take with or immediately following a meal. 90 tablet 3 01/15/2022 at unk   omeprazole (PRILOSEC) 40 MG capsule Take 1 capsule by mouth in the morning and at bedtime. (Patient taking differently: Take 40 mg by mouth 2 (two) times daily.) 180 capsule 2 01/15/2022   potassium chloride SA (KLOR-CON M) 20 MEQ tablet Take 2 tablets (40 mEq total) by mouth daily. 270 tablet 3 01/16/2022   rosuvastatin (CRESTOR) 20 MG tablet TAKE 1 TABLET BY MOUTH ONCE EVERY NIGHT AT BEDTIME (Patient taking differently: Take 20 mg by mouth at bedtime.) 90 tablet 3 01/15/2022   Semaglutide,0.25 or 0.5MG/DOS, (OZEMPIC, 0.25 OR 0.5 MG/DOSE,) 2 MG/3ML SOPN Inject 0.5 mg into the skin once a week. (Patient taking differently: Inject 0.5 mg into the skin once a week. Monday) 3 mL 11 01/13/2022   senna-docusate (SENOKOT S) 8.6-50 MG tablet Take 1 to 2  tablets by mouth once or twice a day as needed for constipation 60 tablet 3 Past Week   warfarin (COUMADIN) 2.5 MG tablet Take 1 tablet by mouth daily unless otherwise instructed. (Patient taking differently: Take 1.25-2.5 mg by mouth See admin instructions. Taking 2.5 mg on Wed, Friday, Sunday, then 1.25 mg on Tuesday, Thursday, Saturday- not taking any on Monday.) 90 tablet 3 01/15/2022 at 2100   benzonatate (TESSALON) 100 MG capsule Take 1 capsule (100 mg total) by mouth 3 (three) times daily as needed for cough. (Patient not taking: Reported on 01/16/2022) 90 capsule 11 Completed Course   Blood Glucose Monitoring Suppl (ONETOUCH VERIO REFLECT) w/Device KIT Use to check blood sugar 1 time a day 1 kit 1    Continuous Blood Gluc Sensor (FREESTYLE LIBRE 3 SENSOR) MISC Place 1 sensor on the skin every 14 days. Use to check glucose continuously 6 each 3    Insulin Pen Needle 32G X 4 MM MISC Use to inject insulin up to 6 times a day 500 each 3    Lancets Misc. (ACCU-CHEK FASTCLIX LANCET) KIT Check your blood 4 times a day dx code 250.00 insulin requiring 1 kit 2    nystatin powder Apply topically 3  times daily. (Patient not taking: Reported on 01/16/2022) 60 g 4 Not Taking   ondansetron (ZOFRAN) 4 MG tablet Take 1 tablet (4 mg total) by mouth every 8 (eight) hours as needed for nausea or vomiting. (Patient not taking: Reported on 01/16/2022) 21 tablet 0 Completed Course   OXYGEN Inhale 2 L into the lungs at bedtime.        Vitals   Vitals:   01/17/22 1342 01/17/22 1625 01/17/22 2336 01/18/22 0125  BP: (!) 171/52 (!) 177/53 (!) 172/60 (!) 166/62  Pulse: 78 72 75 66  Resp: _0 Temp: 97.8 F (36.6 C)  97.9 F (36.6 C)   TempSrc: Axillary  Oral   SpO2:  94% 95%   Weight: 73.2 kg     Height: _1  (1.575 m)        Body mass index is 29.52 kg/m.  Physical Exam   General: Laying comfortably in bed; in  no acute distress.  HENT: Normal oropharynx and mucosa. Normal external appearance of  ears and nose.  Neck: Supple, no pain or tenderness  CV: No JVD. No peripheral edema.  Pulmonary: Symmetric Chest rise. Normal respiratory effort.  Abdomen: Soft to touch, non-tender.  Ext: No cyanosis, edema, or deformity  Skin: No rash. Normal palpation of skin.   Musculoskeletal: Normal digits and nails by inspection. No clubbing.   Neurologic Examination  Mental status/Cognition: Alert, oriented to self, place, month and year, good attention.  Speech/language: Fluent, comprehension intact, object naming intact, repetition intact.  Cranial nerves:   CN II Pupils equal and reactive to light, no VF deficits    CN III,IV,VI EOM intact, no gaze preference or deviation, no nystagmus    CN V normal sensation in V1, V2, and V3 segments bilaterally    CN VII no asymmetry, no nasolabial fold flattening    CN VIII normal hearing to speech    CN IX & X normal palatal elevation, no uvular deviation    CN XI 5/5 head turn and 5/5 shoulder shrug bilaterally    CN XII midline tongue protrusion    Motor:  Muscle bulk: normal, tone normal, pronator drift none tremor none Mvmt Root Nerve  Muscle Right Left Comments  SA C5/6 Ax Deltoid 5 5   EF C5/6 Mc Biceps 5 5   EE C6/7/8 Rad Triceps 5 5   WF C6/7 Med FCR     WE C7/8 PIN ECU     F Ab C8/T1 U ADM/FDI 5 5   HF L1/2/3 Fem Illopsoas 5 5   KE L2/3/4 Fem Quad     DF L4/5 D Peron Tib Ant     PF S1/2 Tibial Grc/Sol      Sensation:  Light touch Intact throughout   Pin prick    Temperature    Vibration   Proprioception    Coordination/Complex Motor:  - Finger to Nose intact BL - Heel to shin intact BL - Rapid alternating movement are normal - Gait: deferred due to significant dizziness.  Labs   CBC:  Recent Labs  Lab 01/15/22 0453 01/16/22 1419 01/17/22 0425  WBC 7.0 9.1 7.0  NEUTROABS 4.3 6.8  --   HGB 8.0* 8.3* 9.1*  HCT 28.2* 31.4* 32.1*  MCV 74.6* 78.1* 78.9*  PLT 214 278 465    Basic Metabolic Panel:  Lab Results   Component Value Date   NA 136 01/17/2022   K 4.0 01/17/2022   CO2 26 01/17/2022   GLUCOSE 248 (H) 01/17/2022   BUN 15 01/17/2022   CREATININE 1.21 (H) 01/17/2022   CALCIUM 8.8 (L) 01/17/2022   GFRNONAA 48 (L) 01/17/2022   GFRAA 58 (L) 09/26/2019   Lipid Panel:  Lab Results  Component Value Date   LDLCALC 35 08/23/2021   HgbA1c:  Lab Results  Component Value Date   HGBA1C 7.1 (A) 01/16/2022   Urine Drug Screen:     Component Value Date/Time   LABOPIA POSITIVE (A) 01/07/2013 2114   COCAINSCRNUR NONE DETECTED 01/07/2013 2114   LABBENZ NONE DETECTED 01/07/2013 2114   AMPHETMU NONE DETECTED 01/07/2013 2114   THCU NONE DETECTED 01/07/2013 2114   LABBARB NONE DETECTED 01/07/2013 2114    Alcohol Level     Component Value Date/Time   ETH <10 08/16/2021 1710    CT Head without contrast(Personally reviewed): CTH was negative for a large hypodensity concerning for a large territory infarct or hyperdensity concerning for an ICH  MR Angio head without contrast and Carotid Duplex BL(Personally reviewed): pending  MRI Brain(Personally reviewed): Small medial R frontal punctate cortical stroke  Impression   Shemika Robbs is a 71 y.o. female with PMH significant for falls, DM2, CKD 3, COPD, PAD, MDD, HFpEF, hx of RCC s/p cryoablation in 2013 and 2022, porcine MV and pAfibb on warfarin who is admitted with multiple falls, dizziness with head movement and anemia.  She had MRI of her brain to look for central causes of her dizziness and was notable for a small punctate medial R frontal lobe cortical stroke. No stroke in the posterior circulation however.  Noted stroke on MRI is likely incidental findings and does not explain her vertigo and nausea.  Suspect that the noted vertigo is likely peripheral specially given its off and on nature and triggered by specific movements of her head. Likely this is BPPV triggered in the setting of a fall. Alsternatively, could be  cervicigenic in nature specially given the noted BL trapezius spasm.  Recommendations  R medial frontal lobe punctate stroke: - Frequent Neuro checks per stroke unit protocol - Recommend Vascular imaging with MRA Angio Head without contrast and US Carotid doppler - Recommend obtaining TTE - Recommend obtaining Lipid panel with LDL - Please start statin if LDL > 70 - Recommend HbA1c - continue warfarin. - Recommend DVT ppx - SBP goal - permissive hypertension first 24 h < 220/110. Held home meds.  - Recommend Telemetry monitoring for arrythmia - Recommend bedside swallow screen prior to PO intake. - Stroke education booklet - Recommend PT/OT/SLP consult - counseled her extensively on quitting smoking to reduce risk of stroke in the future.  Peripheral vertigo: Likely BPPV or could be cervicogenic - ordered Bengay/menthol balm for her neck along with heating pads. - Agree with Zofran. Will add Meclizine PRN to her regimen - recommend short course of PO Prednisone 50m daily for 5 days, then 40 on day 6, 30 on day 7, 20 on day 8, 10 on day 9, 5 on day 10 ann then stop. - Recommend Neck PT and outpatient Vestibular rehab.  ______________________________________________________________________   Thank you for the opportunity to take part in the care of this patient. If you have any further questions, please contact the neurology consultation attending.  Signed,  SRussell SpringsPager Number [phone]_ _ _   _ __   _ __ _ _  __ __   _ __   __ _

## 2022-01-18 NOTE — Evaluation (Signed)
Occupational Therapy Evaluation Patient Details Name: [name] MRN: [id number] DOB: August 28, 1950 Today's Date: 01/18/2022   History of Present Illness 71 y.o. female with PMH significant for falls, DM2, CKD 3, COPD, PAD, MDD, HFpEF, hx of RCC s/p cryoablation in 2013 and 2022, porcine MV and pAfibb on warfarin who is admitted with multiple falls, dizziness. MRI brain:  small punctate medial R frontal lobe.   Clinical Impression   Patient admitted for the diagnosis above.  PTA she lives alone, but her sister assists with community mobility.  The patient dies not drive and is Mod I at home for iADL and ADL, and she has a 4WRW for mobility.  Patient continues to experience dizziness with head movements, and did experience two loss of balance with mobility to the bathroom.  Currently she is up to [facility] for in room mobility and ADL completion due to balance deficits.  OT will follow in the acute setting to maximize her functional status for an eventual return home.  No HH OT is anticipated.         Recommendations for follow up therapy are one component of a multi-disciplinary discharge planning process, led by the attending physician.  Recommendations may be updated based on patient status, additional functional criteria and insurance authorization.   Follow Up Recommendations  No OT follow up     Assistance Recommended at Discharge Set up Supervision/Assistance  Patient can return home with the following Assist for transportation    Functional Status Assessment  Patient has had a recent decline in their functional status and demonstrates the ability to make significant improvements in function in a reasonable and predictable amount of time.  Equipment Recommendations  None recommended by OT    Recommendations for Other Services       Precautions / Restrictions Precautions Precautions: Fall Restrictions Weight Bearing Restrictions: No      Mobility Bed Mobility Overal  bed mobility: Needs Assistance Bed Mobility: Supine to Sit     Supine to sit: Supervision          Transfers Overall transfer level: Needs assistance Equipment used: Rolling walker (2 wheels) Transfers: Sit to/from Stand, Bed to chair/wheelchair/BSC Sit to Stand: Min guard     Step pivot transfers: Min guard, Min assist            Balance Overall balance assessment: Needs assistance Sitting-balance support: Feet supported Sitting balance-Leahy Scale: Good     Standing balance support: Reliant on assistive device for balance Standing balance-Leahy Scale: Poor                             ADL either performed or assessed with clinical judgement   ADL       Grooming: Wash/dry hands;Min guard;Standing               Lower Body Dressing: Minimal assistance;Sit to/from stand   Toilet Transfer: Min guard;Minimal assistance;Regular Toilet;Rolling walker (2 wheels);Ambulation             General ADL Comments: Two LOB     Vision Patient Visual Report: No change from baseline       Perception     Praxis      Pertinent Vitals/Pain Pain Assessment Pain Assessment: No/denies pain     Hand Dominance Right   Extremity/Trunk Assessment Upper Extremity Assessment Upper Extremity Assessment: Overall WFL for tasks assessed   Lower Extremity Assessment Lower Extremity Assessment: Defer to  PT evaluation   Cervical / Trunk Assessment Cervical / Trunk Assessment: Kyphotic   Communication Communication Communication: No difficulties   Cognition Arousal/Alertness: Awake/alert Behavior During Therapy: WFL for tasks assessed/performed Overall Cognitive Status: Within Functional Limits for tasks assessed                                       General Comments   BP elevated this am.    Exercises     Shoulder Instructions      Home Living Family/patient expects to be discharged to:: Private residence Living Arrangements:  Alone Available Help at Discharge: Family;Available PRN/intermittently Type of Home: House Home Access: Stairs to enter [employer] of Steps: 1 Entrance Stairs-Rails: None Home Layout: One level     Bathroom Shower/Tub: Teacher, early years/pre: Standard Bathroom Accessibility: Yes How Accessible: Accessible via walker Home Equipment: Rollator (4 wheels);BSC/3in1          Prior Functioning/Environment Prior Level of Function : Independent/Modified Independent                        OT Problem List: Impaired balance (sitting and/or standing)      OT Treatment/Interventions: Self-care/ADL training;Therapeutic activities;Patient/family education;Balance training    OT Goals(Current goals can be found in the care plan section) Acute Rehab OT Goals Patient Stated Goal: Go home OT Goal Formulation: With patient Time For Goal Achievement: 01/31/22 Potential to Achieve Goals: Good ADL Goals Pt Will Perform Grooming: with modified independence;standing Pt Will Perform Lower Body Dressing: with modified independence;sit to/from stand Pt Will Transfer to Toilet: with modified independence;ambulating;regular height toilet  OT Frequency: Min 2X/week    Co-evaluation              AM-PAC OT "6 Clicks" Daily Activity     Outcome Measure Help from another person eating meals?: None Help from another person taking care of personal grooming?: A Little Help from another person toileting, which includes using toliet, bedpan, or urinal?: A Little Help from another person bathing (including washing, rinsing, drying)?: A Little Help from another person to put on and taking off regular upper body clothing?: None Help from another person to put on and taking off regular lower body clothing?: A Little 6 Click Score: 20   End of Session Equipment Utilized During Treatment: Rolling walker (2 wheels) Nurse Communication: Mobility status  Activity Tolerance:  Patient tolerated treatment well Patient left: in chair;with call bell/phone within reach;with nursing/sitter in room  OT Visit Diagnosis: Unsteadiness on feet (R26.81)                Time: 0370-9643 OT Time Calculation (min): 19 min Charges:  OT General Charges $OT Visit: 1 Visit OT Evaluation $OT Eval Moderate Complexity: 1 Mod  01/18/2022  RP, OTR/L  Acute Rehabilitation Services  Office:  [phone]   Metta Clines 01/18/2022, 9:04 AM

## 2022-01-18 NOTE — Care Management Obs Status (Signed)
[facility] NOTIFICATION   Patient Details  Name: [name] MRN: [id number] Date of Birth: 09/24/1950   Medicare Observation Status Notification Given:  Yes    Carles Collet, RN 01/18/2022, 8:59 AM

## 2022-01-18 NOTE — Progress Notes (Addendum)
STROKE TEAM PROGRESS NOTE   INTERVAL HISTORY Patient was evaluated at bedside. She was alert and oriented x4. There are no focal neurologic deficits. Patient was frustrated on interview, stating she does not know what is going on about her care. We discussed potentially switching to Eliquis insteas of warfarin, but patient was hesitant this suggestion. Informed patient we will be recommending to her primary team an increase in her INR goals to be therapeutic for a-fib.  Vitals:   01/17/22 1625 01/17/22 2336 01/18/22 0125 01/18/22 0838  BP: (!) 177/53 (!) 172/60 (!) 166/62 (!) 185/50  Pulse: 72 75 66 60  Resp: _0 Temp:  97.9 F (36.6 C)  98 F (36.7 C)  TempSrc:  Oral  Oral  SpO2: 94% 95%  97%  Weight:      Height:       CBC:  Recent Labs  Lab 01/15/22 0453 01/16/22 1419 01/17/22 0425 01/18/22 0347  WBC 7.0 9.1 7.0 8.0  NEUTROABS 4.3 6.8  --   --   HGB 8.0* 8.3* 9.1* 9.4*  HCT 28.2* 31.4* 32.1* 32.2*  MCV 74.6* 78.1* 78.9* 76.1*  PLT 214 278 212 917   Basic Metabolic Panel:  Recent Labs  Lab 01/17/22 0425 01/18/22 0347  NA 136 140  K 4.0 4.4  CL 102 105  CO2 26 28  GLUCOSE 248* 131*  BUN 15 11  CREATININE 1.21* 1.20*  CALCIUM 8.8* 9.2   Lipid Panel:  Recent Labs  Lab 01/18/22 0347  CHOL 102  TRIG 148  HDL 32*  CHOLHDL 3.2  VLDL 30  LDLCALC 40   HgbA1c:  Recent Labs  Lab 01/16/22 1102  HGBA1C 7.1*   Urine Drug Screen: No results for input(s): "LABOPIA", "COCAINSCRNUR", "LABBENZ", "AMPHETMU", "THCU", "LABBARB" in the last 168 hours.  Alcohol Level No results for input(s): "ETH" in the last 168 hours.  IMAGING past 24 hours VAS US CAROTID  Result Date: 01/18/2022 Carotid Arterial Duplex Study Patient Name:  [name] [facility]  Date of Exam:   01/18/2022 Medical Rec #: [id number]               Accession #:    [id number] Date of Birth: [birth year]               Patient Gender: F Patient Age:   71 years Exam Location:  [facility]  Procedure:      VAS US CAROTID Referring Phys: Cindee Salt HOFFMAN --------------------------------------------------------------------------------  Indications:       CVA. Limitations        Today's exam was limited due to the patient's respiratory                    variation. Patient somnolence with wheezing. Comparison Study:  08-05-2021 Prior carotid duplex showed 1-39% ICA stenosis                    bilaterally. Elevated PSV throughout the carotid system on                    previous examinations. Performing Technologist: Darlin Coco RDMS, RVT  Examination Guidelines: A complete evaluation includes B-mode imaging, spectral Doppler, color Doppler, and power Doppler as needed of all accessible portions of each vessel. Bilateral testing is considered an integral part of a complete examination. Limited examinations for reoccurring indications may be performed as noted.  Right Carotid Findings: +----------+--------+--------+--------+------------------+--------+  PSV cm/sEDV cm/sStenosisPlaque DescriptionComments +----------+--------+--------+--------+------------------+--------+ CCA Prox  118     10      <50%    heterogenous               +----------+--------+--------+--------+------------------+--------+ CCA Distal140     11                                         +----------+--------+--------+--------+------------------+--------+ ICA Prox  160     19      1-39%   heterogenous               +----------+--------+--------+--------+------------------+--------+ ICA Mid   133     13                                         +----------+--------+--------+--------+------------------+--------+ ICA Distal143     19                                         +----------+--------+--------+--------+------------------+--------+ ECA       163                                                +----------+--------+--------+--------+------------------+--------+  +----------+--------+-------+----------------+-------------------+           PSV cm/sEDV cmsDescribe        Arm Pressure (mmHG) +----------+--------+-------+----------------+-------------------+ [id number]            Multiphasic, WNL                    +----------+--------+-------+----------------+-------------------+ +---------+--------+--+--------+-+---------+ VertebralPSV cm/s76EDV cm/s8Antegrade +---------+--------+--+--------+-+---------+  Left Carotid Findings: +----------+--------+--------+--------+------------------+--------+           PSV cm/sEDV cm/sStenosisPlaque DescriptionComments +----------+--------+--------+--------+------------------+--------+ CCA Prox  141     17                                         +----------+--------+--------+--------+------------------+--------+ CCA Distal100     13                                         +----------+--------+--------+--------+------------------+--------+ ICA Prox  157     21      1-39%   heterogenous               +----------+--------+--------+--------+------------------+--------+ ICA Mid   162     27                                         +----------+--------+--------+--------+------------------+--------+ ICA Distal141     23                                         +----------+--------+--------+--------+------------------+--------+ ECA       356  37      >50%                               +----------+--------+--------+--------+------------------+--------+ +----------+--------+--------+----------------+-------------------+           PSV cm/sEDV cm/sDescribe        Arm Pressure (mmHG) +----------+--------+--------+----------------+-------------------+ [id number]             Multiphasic, WNL                    +----------+--------+--------+----------------+-------------------+ +---------+--------+---+--------+--+---------+ VertebralPSV cm/s107EDV cm/s17Antegrade  +---------+--------+---+--------+--+---------+   Summary: Right Carotid: Velocities in the right ICA are consistent with a 1-39% stenosis.                Non-hemodynamically significant plaque <50% noted in the CCA. Left Carotid: Velocities in the left ICA are consistent with a 1-39% stenosis.               The ECA appears >50% stenosed. Vertebrals:  Bilateral vertebral arteries demonstrate antegrade flow. Subclavians: Normal flow hemodynamics were seen in bilateral subclavian              arteries. *See table(s) above for measurements and observations.     Preliminary    MR ANGIO HEAD WO CONTRAST  Result Date: 01/18/2022 CLINICAL DATA:  Neuro deficit, acute, stroke suspected. Acute punctate infarction seen in medial right frontal lobe by MRI yesterday. EXAM: MRA HEAD WITHOUT CONTRAST TECHNIQUE: Angiographic images of the Circle of Willis were acquired using MRA technique without intravenous contrast. COMPARISON:  MRI 01/17/2022 FINDINGS: The study suffers from motion degradation because of patient's somnolence. Limited detail. Anterior circulation: From a can be a stab list at both internal carotid arteries are patent through the skull base to the circle-of-Willis. Flow is present in both anterior and middle cerebral arteries. Patent posterior communicating artery at least on the right. Posterior circulation: Both vertebral arteries are patent through the foramen magnum to the basilar. Basilar artery shows flow. Flow seen in both superior cerebellar and posterior cerebral arteries. Anatomic variants: None other appreciable. Other: None IMPRESSION: Severely motion degraded study showing flow within the major anterior and posterior circulation vessels. No reliable detailed data beyond that. Electronically Signed   By: Nelson Chimes M.D.   On: 01/18/2022 11:10    PHYSICAL EXAM General: well-appearing and in no acute distress HEENT: normocephalic and atraumatic Cardiovascular: regular rate Respiratory: normal  respiratory effort and on RA Gastrointestinal: non-tender and non-distended Extremities: moving all extremities spontaneously  Mental Status: [name] is alert; she is oriented to person, place, time, and situation. Speech was clear and fluent without evidence of aphasia. She was able to follow 3 step commands without difficulty.  Cranial Nerves: II:  Visual fields grossly normal III,IV, VI: no ptosis, extra-ocular motions intact bilaterally V,VII: smile symmetric, facial light touch sensation intact bilaterally IX,X: uvula rises symmetrically XI: shoulder symmetrically elevate bilaterally XII: midline tongue extension without atrophy and without fasciculations  Motor: Right : Upper extremity   5/5 full power  Lower extremity   5/5 full power  Left: Upper extremity   5/5 full power Lower extremity   5/5 full power  Tone and bulk: normal tone throughout; no atrophy noted  Sensory: sensation to light touch intact throughout bilaterally  Cerebellar: Finger-to-nose test normal, heel-to-shin test normal  Gait: not observed during encounter  ASSESSMENT/PLAN Ms. Mckynzi Cammon is a 71 y.o. female with PMH significant for falls,  DM2, CKD 3, COPD, PAF, MDD, HFpEF, hx of RCC s/p cryoablation in 2013 and 2022, porcine MV and pAfibb on warfarin admitted for multiple falls, dizziness with head movement and anemia. MRI notable for small punctate medial R frontal lobe cortical stroke.  #R medial frontal lobe ACA territory punctate stroke, incidental finding, etiology likely due to Afib on coumadin with subtherapeutic INR  Code Stroke CT head No acute abnormality.  MRI shows acute punctate infarct involving the medial cortex of the right frontal lobe. MRA head motion degraded  Carotid Doppler unremarkable 2D Echo EF 60 to 65% LDL 35 HgbA1c 7.1 VTE prophylaxis - warfarin warfarin daily prior to admission, now on warfarin daily.  Recommend INR goal 2.0-2.5 for stroke  prevention.  Patient will discuss with her cardiologist later regarding DOAC options. Therapy recommendations: pending Disposition: pending  #Paroxysmal atrial fibrillation Reportedly on sub-therapeutic INR goal of 1.5 - 2.5 due to hx GI bleed, anemia. Patient also reportedly not open to switching to DOAC. - recommending increase INR goal to 2.0 - 2.5 for adequate treatment of PAF for the purpose of recurrent stroke ppx - recommending further discussions with PCP, cardiologist regarding switching from warfarin to Eliquis  #Hypertension, chronic Home meds: metoprolol, resumed by primary team Stable Long-term BP goal normotensive  #Diabetes type II uncontrolled Home meds: semaglutide, insulin HgbA1c 7.1, goal < 7.0 CBGs SSI Close PCP follow-up as outpatient  Tobacco abuse Current smoker Smoking cessation counseling provided Pt is willing to quit  Stroke Risk Factors Advanced Age >/= 53  Congestive heart failure OSA  Other Active Problems #Dizziness #IDA #CKD3a #COPD #HFpEF #PAF #Chronic Pain #MDD  Hospital day # 0   ATTENDING NOTE: I reviewed above note and agree with the assessment and plan. Pt was seen and examined.   71 year old female with history of diabetes, PAF on warfarin, CKD, PAD, CHF, RCC status post cryoablation admitted for multiple falls, dizziness.  MRI showed a small punctate right ACA medial frontal lobe infarct.  CT no acute abnormality.  MRA nondiagnostic due to motion carotid Doppler unremarkable, EF 60 to 65%, LDL 35 and A1c 7.1.  INR 1.4->1.5.  On exam, patient awake alert, no aphasia, follows simple commands.  Neuroexam grossly intact.  Etiology for patient punctate stroke likely incidental finding due to PAF on Coumadin subtherapeutic INR.  Patient was on INR level 1.5-2.5 due to previous GI bleeding and anemia, given current stroke we recommend Coumadin adjustment for INR level 2.0-2.5.  Currently patient reluctant to consider DOAC, but willing  to discuss with her cardiologist in the future for [place] options.  LDL at goal, no statin needed at this time.  PT/OT pending.  For detailed assessment and plan, please refer to above/below as I have made changes wherever appropriate.   Neurology will sign off. Please call with questions. Pt will follow up with stroke clinic NP at [facility] in about 4 weeks. Thanks for the consult.   Rosalin Hawking, MD PhD Stroke Neurology 01/18/2022 6:35 PM    To contact Stroke Continuity provider, please refer to [email or web address] After hours, contact General Neurology

## 2022-01-19 DIAGNOSIS — Z794 Long term (current) use of insulin: Secondary | ICD-10-CM

## 2022-01-19 DIAGNOSIS — I130 Hypertensive heart and chronic kidney disease with heart failure and stage 1 through stage 4 chronic kidney disease, or unspecified chronic kidney disease: Secondary | ICD-10-CM | POA: Diagnosis present

## 2022-01-19 DIAGNOSIS — I480 Paroxysmal atrial fibrillation: Secondary | ICD-10-CM | POA: Diagnosis present

## 2022-01-19 DIAGNOSIS — F329 Major depressive disorder, single episode, unspecified: Secondary | ICD-10-CM | POA: Diagnosis present

## 2022-01-19 DIAGNOSIS — I5032 Chronic diastolic (congestive) heart failure: Secondary | ICD-10-CM | POA: Diagnosis present

## 2022-01-19 DIAGNOSIS — M810 Age-related osteoporosis without current pathological fracture: Secondary | ICD-10-CM | POA: Diagnosis present

## 2022-01-19 DIAGNOSIS — Z1152 Encounter for screening for COVID-19: Secondary | ICD-10-CM | POA: Diagnosis not present

## 2022-01-19 DIAGNOSIS — I5082 Biventricular heart failure: Secondary | ICD-10-CM | POA: Diagnosis present

## 2022-01-19 DIAGNOSIS — G4733 Obstructive sleep apnea (adult) (pediatric): Secondary | ICD-10-CM | POA: Diagnosis present

## 2022-01-19 DIAGNOSIS — J439 Emphysema, unspecified: Secondary | ICD-10-CM | POA: Diagnosis present

## 2022-01-19 DIAGNOSIS — F17210 Nicotine dependence, cigarettes, uncomplicated: Secondary | ICD-10-CM | POA: Diagnosis present

## 2022-01-19 DIAGNOSIS — E1122 Type 2 diabetes mellitus with diabetic chronic kidney disease: Secondary | ICD-10-CM | POA: Diagnosis present

## 2022-01-19 DIAGNOSIS — N1831 Chronic kidney disease, stage 3a: Secondary | ICD-10-CM

## 2022-01-19 DIAGNOSIS — R42 Dizziness and giddiness: Secondary | ICD-10-CM

## 2022-01-19 DIAGNOSIS — E1142 Type 2 diabetes mellitus with diabetic polyneuropathy: Secondary | ICD-10-CM | POA: Diagnosis present

## 2022-01-19 DIAGNOSIS — J449 Chronic obstructive pulmonary disease, unspecified: Secondary | ICD-10-CM

## 2022-01-19 DIAGNOSIS — I639 Cerebral infarction, unspecified: Secondary | ICD-10-CM | POA: Diagnosis not present

## 2022-01-19 DIAGNOSIS — D500 Iron deficiency anemia secondary to blood loss (chronic): Secondary | ICD-10-CM | POA: Diagnosis present

## 2022-01-19 DIAGNOSIS — D509 Iron deficiency anemia, unspecified: Secondary | ICD-10-CM

## 2022-01-19 DIAGNOSIS — M797 Fibromyalgia: Secondary | ICD-10-CM | POA: Diagnosis present

## 2022-01-19 DIAGNOSIS — I63521 Cerebral infarction due to unspecified occlusion or stenosis of right anterior cerebral artery: Secondary | ICD-10-CM | POA: Diagnosis present

## 2022-01-19 DIAGNOSIS — R296 Repeated falls: Secondary | ICD-10-CM | POA: Diagnosis present

## 2022-01-19 DIAGNOSIS — I2729 Other secondary pulmonary hypertension: Secondary | ICD-10-CM | POA: Diagnosis present

## 2022-01-19 DIAGNOSIS — E118 Type 2 diabetes mellitus with unspecified complications: Secondary | ICD-10-CM

## 2022-01-19 DIAGNOSIS — Z8249 Family history of ischemic heart disease and other diseases of the circulatory system: Secondary | ICD-10-CM | POA: Diagnosis not present

## 2022-01-19 DIAGNOSIS — H8110 Benign paroxysmal vertigo, unspecified ear: Secondary | ICD-10-CM | POA: Diagnosis present

## 2022-01-19 DIAGNOSIS — G4700 Insomnia, unspecified: Secondary | ICD-10-CM | POA: Diagnosis present

## 2022-01-19 DIAGNOSIS — E785 Hyperlipidemia, unspecified: Secondary | ICD-10-CM | POA: Diagnosis present

## 2022-01-19 DIAGNOSIS — G8929 Other chronic pain: Secondary | ICD-10-CM | POA: Diagnosis present

## 2022-01-19 DIAGNOSIS — Z953 Presence of xenogenic heart valve: Secondary | ICD-10-CM | POA: Diagnosis not present

## 2022-01-19 LAB — CBC WITH DIFFERENTIAL/PLATELET
Abs Immature Granulocytes: 0.03 10*3/uL (ref 0.00–0.07)
Basophils Absolute: 0 10*3/uL (ref 0.0–0.1)
Basophils Relative: 0 %
Eosinophils Absolute: 0 10*3/uL (ref 0.0–0.5)
Eosinophils Relative: 0 %
HCT: 28.2 % — ABNORMAL LOW (ref 36.0–46.0)
Hemoglobin: 8.5 g/dL — ABNORMAL LOW (ref 12.0–15.0)
Immature Granulocytes: 0 %
Lymphocytes Relative: 18 %
Lymphs Abs: 1.2 10*3/uL (ref 0.7–4.0)
MCH: 22.7 pg — ABNORMAL LOW (ref 26.0–34.0)
MCHC: 30.1 g/dL (ref 30.0–36.0)
MCV: 75.2 fL — ABNORMAL LOW (ref 80.0–100.0)
Monocytes Absolute: 0.9 10*3/uL (ref 0.1–1.0)
Monocytes Relative: 13 %
Neutro Abs: 4.6 10*3/uL (ref 1.7–7.7)
Neutrophils Relative %: 69 %
Platelets: 193 10*3/uL (ref 150–400)
RBC: 3.75 MIL/uL — ABNORMAL LOW (ref 3.87–5.11)
RDW: 18.6 % — ABNORMAL HIGH (ref 11.5–15.5)
WBC: 6.8 10*3/uL (ref 4.0–10.5)
nRBC: 0 % (ref 0.0–0.2)

## 2022-01-19 LAB — BASIC METABOLIC PANEL
Anion gap: 9 (ref 5–15)
BUN: 18 mg/dL (ref 8–23)
CO2: 26 mmol/L (ref 22–32)
Calcium: 9.5 mg/dL (ref 8.9–10.3)
Chloride: 102 mmol/L (ref 98–111)
Creatinine, Ser: 1.13 mg/dL — ABNORMAL HIGH (ref 0.44–1.00)
GFR, Estimated: 52 mL/min — ABNORMAL LOW (ref >60)
Glucose, Bld: 229 mg/dL — ABNORMAL HIGH (ref 70–99)
Potassium: 4.3 mmol/L (ref 3.5–5.1)
Sodium: 137 mmol/L (ref 135–145)

## 2022-01-19 LAB — GLUCOSE, CAPILLARY
Glucose-Capillary: 171 mg/dL — ABNORMAL HIGH (ref 70–99)
Glucose-Capillary: 264 mg/dL — ABNORMAL HIGH (ref 70–99)
Glucose-Capillary: 278 mg/dL — ABNORMAL HIGH (ref 70–99)

## 2022-01-19 LAB — PROTIME-INR
INR: 1.9 — ABNORMAL HIGH (ref 0.8–1.2)
Prothrombin Time: 21.3 seconds — ABNORMAL HIGH (ref 11.4–15.2)

## 2022-01-19 MED ORDER — NOVOLOG FLEXPEN 100 UNIT/ML ~~LOC~~ SOPN: 3.0000 [IU] | PEN_INJECTOR | Freq: Three times a day (TID) | SUBCUTANEOUS | 0 refills | Status: DC

## 2022-01-19 MED ORDER — TRESIBA FLEXTOUCH 100 UNIT/ML ~~LOC~~ SOPN: 45.0000 [IU] | PEN_INJECTOR | Freq: Every day | SUBCUTANEOUS | 5 refills | Status: DC

## 2022-01-19 MED ORDER — WARFARIN SODIUM 2.5 MG PO TABS
2.5000 mg | ORAL_TABLET | Freq: Once | ORAL | Status: AC
  Administered 2022-01-19: 2.5 mg via ORAL
  Filled 2022-01-19: qty 1

## 2022-01-19 MED ORDER — INSULIN PEN NEEDLE 32G X 4 MM MISC: 0 refills | Status: DC

## 2022-01-19 MED ORDER — MECLIZINE HCL 25 MG PO TABS: 25.0000 mg | ORAL_TABLET | Freq: Three times a day (TID) | ORAL | 0 refills | Status: DC | PRN

## 2022-01-19 MED ORDER — PREDNISONE 10 MG PO TABS: ORAL_TABLET | ORAL | 0 refills | Status: AC

## 2022-01-19 NOTE — TOC Transition Note (Signed)
Transition of Care [facility]) - CM/SW Discharge Note   Patient Details  Name: [name] MRN: [id number] Date of Birth: August 08, 1950  [facility] CM/SW Contact:  Carles Collet, RN Phone Number: 01/19/2022, 3:11 PM   Clinical Narrative:     [name] w patient over the phone. She states that she recently had HH therapies and didn't feel like she wanted to do it again, didn't feel they were helpful. She states that she has 72 year old DME at home. RW, Rollator, and BSC. Would like new rollator and BSC delivered to her house. Requested through [employer].   Final next level of care: Home/Self Care Barriers to Discharge: No Barriers Identified   Patient Goals and CMS Choice Patient states their goals for this hospitalization and ongoing recovery are:: to go home      Discharge Placement                       Discharge Plan and Services                DME Arranged: Bedside commode, Walker rolling with seat DME Agency: [employer] Date DME Agency Contacted: 01/19/22 Time DME Agency Contacted: [id number] Representative spoke with at DME Agency: Brenton Grills HH Arranged: Refused [name]          Social Determinants of Health ([place]) Interventions     Readmission Risk Interventions     No data to display

## 2022-01-19 NOTE — Progress Notes (Addendum)
[facility] [place] for Warfarin Indication: atrial fibrillation  Allergies  Allergen Reactions   Dexilant [Dexlansoprazole] Other (See Comments)    Made acid reflux worse   Lorazepam Other (See Comments)    Patient's sister noted that ativan caused the patient to become extremely confused during hospitalization 09/2010; tolerates Xanax   Oxycontin [Oxycodone] Other (See Comments)    headache   Tramadol Hcl Swelling    Ankle swelling    Patient Measurements: Height: _0  (157.5 cm) Weight: 73.2 kg (161 lb 6.4 oz) IBW/kg (Calculated) : 50.1  Vital Signs: Temp: 97.7 F (36.5 C) (12/10 0740) Temp Source: Oral (12/10 0740) BP: 179/58 (12/10 0740) Pulse Rate: 55 (12/10 0740)  Labs: Recent Labs    01/16/22 1434 01/16/22 1832 01/17/22 0425 01/18/22 0347 01/19/22 0331  HGB  --   --  9.1* 9.4* 8.5*  HCT  --   --  32.1* 32.2* 28.2*  PLT  --   --  212 204 193  LABPROT  --   --  18.1* 19.8* 21.3*  INR  --   --  1.5* 1.7* 1.9*  CREATININE 1.06*  --  1.21* 1.20* 1.13*  TROPONINIHS 9 8  --   --   --     Estimated Creatinine Clearance: 42.1 mL/min (A) (by C-G formula based on SCr of 1.13 mg/dL (H)).  Medications:  Scheduled:   buPROPion  150 mg Oral Daily   DULoxetine  60 mg Oral BID   fluticasone  2 spray Each Nare Daily   gabapentin  600 mg Oral QHS   And   gabapentin  300 mg Oral Daily   insulin aspart  0-15 Units Subcutaneous TID WC   insulin aspart  0-5 Units Subcutaneous QHS   insulin glargine-yfgn  43 Units Subcutaneous Daily   loratadine  10 mg Oral Daily   metoprolol succinate  50 mg Oral Daily   nicotine  14 mg Transdermal Daily   pantoprazole  40 mg Oral BID   predniSONE  50 mg Oral Q breakfast   rosuvastatin  20 mg Oral Daily   Warfarin - Pharmacist Dosing Inpatient   Does not apply q1600   Assessment: 55 YOF presenting for symptomatic anemia with history of long-standing iron deficiency anemia. On warfarin PTA for atrial  fibrillation. PTA regimen: 2.72m Wed/Fri/Sun; 1.238mTu/Thur/Sat, no dose on Mondays. Patient not interested in starting DOAC without discussion with PCP. Patient had INR goal of 1.5 to 2.5 outpatient due to history of GI bleed and anemia.Team would like to increase the INR goal to 2-2.5. Gave slightly higher warfarin dose compared to home dose on 12/9 to increase INR towards new goal. INR today is slightly below goal at 1.9 and has been slowly increasing since 12/7. Hgb 8.5 decreasing s/p blood transfusion on 12/7. Platelets stable in 200s.   Goal of Therapy:  INR goal 2-2.5 per IMTS  Monitor platelets by anticoagulation protocol: Yes   Plan:  Will give 2.5 mg warfarin x1 tonight Daily INR and monitoring for signs/symptoms of bleeding  KaJeneen RinksPharm.D PGY1 Pharmacy Resident 01/19/2022 9:03 AM

## 2022-01-19 NOTE — Discharge Instructions (Addendum)
You were hospitalized for dizziness and anemia. Blood count stable. Dizziness improved with meclizine. Please take the meclizine as needed and stop it once dizziness resolves. You will continue with prednisone taper for next 8 days. Increase your [name] to 45 units daily while on steroids. Also add on Novolog (meal-time insulin) since being on steroid for next 8 days. Thank you for allowing [place] to be part of your care.   I sent message to our clinic to coordinate hospital follow-up soon. Please follow up with the [facility] within 1 week. Please follow up with Dr. Elie Confer about your INR range after the hospitalization, neurology recommends new INR range of 2.0-2.5.  Please note these changes made to your medications:  *Please START taking:  -Meclizine 25 mg three times daily as needed. Please stop taking meclizine once your dizziness resolves.  -Prednisone taper: 50 mg daily for 3 days, then 40 mg daily for 1 day, then 30 mg daily for 1 day, then 20 mg daily for 1 day, then 10 mg daily for 1 day, then 5 mg daily on last day -Novolog (meal-time insulin): 3 units three times daily with meals if you eat at least 50% of the meal while being on steroid taper -Increase Tresiba to 45 units daily  Please make sure to follow up with Dr. [name] about changes to your insulin regimen due to being on a prednisone taper and recent hospitalization. Continue following with Dr. Elie Confer for your warfarin and INR levels.   Please call our clinic if you have any questions or concerns, we may be able to help and keep you from a long and expensive emergency room wait. Our clinic and after hours phone number is [phone], the best time to call is Monday through Friday 9 am to 4 pm but there is always someone available 24/7 if you have an emergency. If you need medication refills please notify your pharmacy one week in advance and they will send [place] a request.

## 2022-01-19 NOTE — Inpatient Diabetes Management (Signed)
Inpatient Diabetes Program Recommendations  AACE/ADA: New Consensus Statement on Inpatient Glycemic Control   Target Ranges:  Prepandial:   less than 140 mg/dL      Peak postprandial:   less than 180 mg/dL (1-2 hours)      Critically ill patients:  140 - 180 mg/dL    Latest Reference Range & Units 01/19/22 07:39 01/19/22 11:07  Glucose-Capillary 70 - 99 mg/dL 171 (H) 264 (H)    Latest Reference Range & Units 01/17/22 15:28 01/17/22 20:15 01/18/22 16:31 01/18/22 21:30  Glucose-Capillary 70 - 99 mg/dL 150 (H) 146 (H) 250 (H) 258 (H)   Review of Glycemic Control  Diabetes history: DM2 Outpatient Diabetes medications: Tresiba 43 units daily, Ozempic 0.5 mg Qweek (Monday) Current orders for Inpatient glycemic control: Semglee 43 units daily, Novolog 0-15 units TID with meals, Novolog 0-5 units QHS; Prednisone 50 mg daily  Inpatient Diabetes Program Recommendations:    Outpatient DM medications: Since patient will be discharging on 10 day taper of Prednisone, anticipate glucose will be elevated. While patient is ordered steroids outpatient, consider increasing Tresiba to 45 units daily and ordering Novolog 3 units TID with meals for meal coverage if patient eats at least 50% of meals. Have patient follow up with PCP before end of Prednisone taper so PCP can continue to assist with adjusting insulin regimen as steroids are tapered. Will need Rx for Novolog Flexpen [phone]).  NOTE: Received page from Dr. Markus Jarvis regarding recommendations for patient discharging on Prednisone taper. Based on glucose trends as inpatient, post prandial glucose is consistently elevated with steroids. Spoke with patient over the phone. Patient reports that she takes [place] 43 units daily and Ozempic 0.5 mg Qweek (Monday). Patient reports that she uses FreeStyle Libre CGM for glucose monitoring and reports that her glucose is "stupid" but her PCP tells her that her A1C is good. Asked patient to clarify what she means by  "stupid" and she stated that "sugars are all over, up and down." Patient states she rarely has hypoglycemia but when she does there is always a reason for it. Discussed that she will be discharging on 10 day Prednisone taper so anticipate glucose to be elevated. Discussed recommendation to increase Tresiba slightly to 45 units daily and add Novolog 3 units TID with meals for meal coverage. Explained that if she does not eat a meal then she would not take the Novolog 3 units. Patient reports that she has used Novolog insulin in the past and she would be willing to take it short term while she is taking the steroids. Explained that once the steroids are tapered, the insulin will need to be adjusted. Asked patient to be sure to reach out to PCP if she has issues at all with hypoglycemia so she can be advised on what to do with insulin dosages. Patient states she would prefer to use insulin pens and has plenty of insulin pen needles at home Patient verbalized understanding of information and states she has no questions at this time. Sent chat message to Dr. Markus Jarvis regarding recommendations and discussion with patient.  Thanks, Barnie Alderman, RN, MSN, [place] Diabetes Coordinator Inpatient Diabetes Program [phone] (Team Pager from 8am to [place])

## 2022-01-19 NOTE — Discharge Summary (Signed)
Name: [name] MRN: [id number] DOB: [birth year] 71 y.o. PCP: Angelica Pou, MD  Date of Admission: 01/16/2022  2:05 PM Date of Discharge: 01/19/2022 Attending Physician: Dr. Angelia Mould  Discharge Diagnosis: Principal Problem:   Frequent falls Active Problems:   Type 2 diabetes mellitus with complications (HCC)   Fibromyalgia   Chronic congestive heart failure with left ventricular diastolic dysfunction (HCC)   PAF (paroxysmal atrial fibrillation) ([place])   Long term current use of anticoagulant therapy   Stage 3a chronic kidney disease (CKD) (HCC)   Chronic iron deficiency anemia   COPD mixed type (HCC)   Vertigo   Acute CVA (cerebrovascular accident) ([place])   Falls frequently    Discharge Medications: Allergies as of 01/19/2022       Reactions   Dexilant [dexlansoprazole] Other (See Comments)   Made acid reflux worse   Lorazepam Other (See Comments)   Patient's sister noted that ativan caused the patient to become extremely confused during hospitalization 09/2010; tolerates Xanax   Oxycontin [oxycodone] Other (See Comments)   headache   Tramadol Hcl Swelling   Ankle swelling        Medication List     TAKE these medications    Accu-Chek FastClix Lancet Kit Check your blood 4 times a day dx code 250.00 insulin requiring   albuterol 108 (90 Base) MCG/ACT inhaler Commonly known as: ProAir HFA Inhale 2 puffs into the lungs every 6 (six) hours as needed for shortness of breath.   ALIGN PREBIOTIC-PROBIOTIC PO Take 1 capsule by mouth daily.   ALPRAZolam 1 MG tablet Commonly known as: XANAX Take 1/2 - 1 tablet (1/2  - 1 mg total) by mouth at bedtime as needed for anxiety or sleep. May take 1/2 tablet during the day if needed for anxiety   benzonatate 100 MG capsule Commonly known as: TESSALON Take 1 capsule (100 mg total) by mouth 3 (three) times daily as needed for cough.   buPROPion 150 MG 24 hr tablet Commonly known as: Wellbutrin  XL Take 1 tablet (150 mg total) by mouth every morning.   cetirizine 10 MG tablet Commonly known as: ZyrTEC Allergy Take 1 tablet (10 mg total) by mouth daily.   clobetasol ointment 0.05 % Commonly known as: TEMOVATE Apply to affected area(s) 2 times daily as needed for (irritation).   DULoxetine 60 MG capsule Commonly known as: CYMBALTA Take 1 capsule by mouth twice a day. What changed:  how much to take when to take this   fluticasone 50 MCG/ACT nasal spray Commonly known as: FLONASE Place 2 sprays into both nostrils daily. What changed: when to take this   FreeStyle Libre 3 Sensor Misc Place 1 sensor on the skin every 14 days. Use to check glucose continuously   furosemide 80 MG tablet Commonly known as: LASIX Take 1 tablet by mouth every morning AND 1/2 tablet daily in the afternoon. What changed: See the new instructions.   gabapentin 300 MG capsule Commonly known as: NEURONTIN Take 1 capsule in the morning and 2 capsules at night What changed:  how much to take how to take this when to take this additional instructions   HYDROcodone-acetaminophen 5-325 MG tablet Commonly known as: NORCO/VICODIN Take 1-2 tablets by mouth 3 (three) times daily.   Insulin Pen Needle 32G X 4 MM Misc Use to inject insulin up to 4 times a day What changed: additional instructions   ipratropium-albuterol 0.5-2.5 (3) MG/3ML Soln Commonly known as: DUONEB Take 3 mLs by  nebulization every 6 (six) hours as needed (shortness of breath, wheezing).   ketoconazole 2 % cream Commonly known as: NIZORAL Apply 1 application topically at bedtime as needed for irritation.   lidocaine 5 % Commonly known as: Lidoderm Place 1 or 2 patches to painful area of back each day.  Remove & Discard patch within 12 hours or as directed by MD   meclizine 25 MG tablet Commonly known as: ANTIVERT Take 1 tablet (25 mg total) by mouth 3 (three) times daily as needed for dizziness.   metoprolol  succinate 50 MG 24 hr tablet Commonly known as: TOPROL-XL Take 1 tablet by mouth daily. Take with or immediately following a meal.   NovoLOG FlexPen 100 UNIT/ML FlexPen Generic drug: insulin aspart Inject 3 Units into the skin 3 (three) times daily with meals. If you eat at least 50% of the meal while on prednisone taper.   nystatin powder Commonly known as: nystatin Apply topically 3  times daily.   omeprazole 40 MG capsule Commonly known as: PRILOSEC Take 1 capsule by mouth in the morning and at bedtime. What changed:  how much to take when to take this   ondansetron 4 MG tablet Commonly known as: Zofran Take 1 tablet (4 mg total) by mouth every 8 (eight) hours as needed for nausea or vomiting.   OneTouch Verio Reflect w/Device Kit Use to check blood sugar 1 time a day   OXYGEN Inhale 2 L into the lungs at bedtime.   Ozempic (0.25 or 0.5 MG/DOSE) 2 MG/3ML Sopn Generic drug: Semaglutide(0.25 or 0.5MG/DOS) Inject 0.5 mg into the skin once a week. What changed: additional instructions   potassium chloride SA 20 MEQ tablet Commonly known as: KLOR-CON M Take 2 tablets (40 mEq total) by mouth daily.   predniSONE 10 MG tablet Commonly known as: DELTASONE Take 5 tablets (50 mg total) by mouth daily with breakfast for 3 days, THEN 4 tablets (40 mg total) daily with breakfast for 1 day, THEN 3 tablets (30 mg total) daily with breakfast for 1 day, THEN 2 tablets (20 mg total) daily with breakfast for 1 day, THEN 1 tablet (10 mg total) daily with breakfast for 1 day, THEN 0.5 tablets (5 mg total) daily with breakfast for 1 day. Start taking on: January 20, 2022   rosuvastatin 20 MG tablet Commonly known as: CRESTOR TAKE 1 TABLET BY MOUTH ONCE EVERY NIGHT AT BEDTIME What changed:  how much to take how to take this when to take this additional instructions   Stool Softener/Laxative 8.6-50 MG tablet Generic drug: senna-docusate Take 1 to 2 tablets by mouth once or twice a  day as needed for constipation   Trelegy Ellipta 100-62.5-25 MCG/ACT Aepb Generic drug: Fluticasone-Umeclidin-Vilant Inhale 1 puff into the lungs daily.   [name] FlexTouch 100 UNIT/ML FlexTouch Pen Generic drug: insulin degludec Inject 45 Units into the skin daily. What changed: how much to take   warfarin 2.5 MG tablet Commonly known as: COUMADIN Take 1 tablet by mouth daily unless otherwise instructed. What changed:  how much to take how to take this when to take this additional instructions               Durable Medical Equipment  (From admission, onward)           Start     Ordered   01/19/22 1509  For home use only DME Bedside commode  Once       Question:  Patient needs a bedside  commode to treat with the following condition  Answer:  Weakness   01/19/22 1509   01/19/22 1509  For home use only DME 4 wheeled rolling walker with seat  Once       Question:  Patient needs a walker to treat with the following condition  Answer:  Weakness   01/19/22 1509            Disposition and follow-up:   [name] was discharged from [facility] in Stable condition.  At the hospital follow up visit please address:  1.  Follow-up:  a. Dizziness: assess if symptoms resolved    b. Anemia: repeat CBC   c. Right Frontal CVA: assess for symptoms, f/u with neurology    d. Warfarin: new goal 2-2.5 per neurology, follows with Dr. Elie Confer  2.  Labs / imaging needed at time of follow-up: CBC   3.  Pending labs/ test needing follow-up: none  4.  Medication Changes  -Meclizine 25 mg TID PRN -Prednisone taper: 50 mg daily for 3 days, then 40 mg daily for 1 day, then 30 mg daily for 1 day, then 20 mg daily for 1 day, then 10 mg daily for 1 day, then 5 mg daily on last day -Novolog (meal-time insulin): 3 units TID with meals  -Increase Tresiba to 45 units daily  Follow-up Appointments:  Follow-up Information     [facility]. Schedule an appointment as soon as possible for a visit in 1 month(s).   Specialty: Neurology Why: stroke clinic Contact information: [address] [place] [phone]        Angelica Pou, MD. Schedule an appointment as soon as possible for a visit in 1 week(s).   Specialty: Internal Medicine Contact information: [address] [address] [phone]                 Hospital Course by problem list: [name] is a 71 y.o. female with PMH of multiple falls,worsening of her recurrent iron deficient anemia,  T2DM c/b neuropathy,CKD3a,COPD ,chronic multifactorial generalized pain , PAD, HFpEF, MDD who presents with dizziness and multiple falls.   Dizziness Patient presented for worsening dizziness aggravated with positional changes. Described as the room spinning with nausea that lasts for seconds or a minute. Patient recently sustained a fall a few days prior to admission with head trauma and found to have no acute findings on CT head and C-spine. Was seen by Dr. [name] following that and advised to be admitted for blood transfusion and monitoring due to acute dizziness and other associated symptoms. Initial Hgb 8.0 from 11.2 about 5 months ago. Brain MRI did not show findings for posterior stroke, however, incidentally found acute right frontal CVA. Patient was started on meclizine and prednisone with significant improvement. Given improvement with meds, negative imaging for central etiology and periodic nature of dizziness, suspect peripheral cause such as BPPV. PT recommends home health. She will continue with meclizine PRN and complete 10 day prednisone taper.   Iron deficiency anemia Initial Hgb was 8.0. Concern for symptomatic anemia. Was evaluated in ED prior to admission and had two negative hemoccults. Likely slow chronic GI bleed and inadequate iron repletion. She has a history of infusion  reaction but does not appear as though has allergic reaction to IV iron. She received dose of feraheme infusion, tolerated well. Hgb has been stable. Will need follow-up with PCP.    Acute right  frontal CVA Asymptomatic and incidentally found during on MRI. Neurological exam unremarkable. MRA normal but limited by motion. US carotid showed left ECA >50% stenosis which patient was already aware of. Echo showed EF 93-57%, grade 2 diastolic dysfunction, dilated LA and mild-mod stenosis of replaced mitral valve. LDL 40 at goal. In regards to anticoagulation, neurology recommending DOAC, however, patient would rather discuss in outpatient setting with PCP. Increase INR goal to 2-2.5.  T2DM with peripheral neuropathy PTA on Tresiba 43u daily and Ozempic. A1c 7.1%. Restarted her home insulin dose with SSI since being on steroids. At discharge, plan to increase Tresiba to 45 units daily and add on Novolog 3 units TID with meals since she will need to complete prednisone taper.    CKD3a Creatinine at her baseline creatinine of 1.0-1.20.   COPD Will continue home Trelegy.    HTN HFpEF Will continue home metoprolol succinate. Did not restart home lasix given euvolemic. She can resume lasix at discharge and continue following with cardiology.    Paroxysmal atrial fibrillation On warfarin and followed by Dr. Elie Confer. Original INR goal of 1.5-2.5. Given recent findings of acute CVA, neurology recommended new INR goal of 2-2.5.   Chronic pain Will continue home medications of gabapentin, Norco, and Cymbalta.   MDD Will continue home bupropion and xanax.    Discharge Subjective: Patient reports feeling better and no dizziness at this time after taking meclizine. Denies any numbness or weakness of her extremities. She has been on warfarin for several years and is somewhat hesitant with switching to another medication. Discussed with her about home health PT, however, patient declined the service and states  it has not helped before.   Discharge Exam:   BP (!) 156/44 (BP Location: Left Arm)   Pulse 61   Temp (!) 97.4 F (36.3 C) (Oral)   Resp 18   Ht _0  (1.575 m)   Wt 73.2 kg   SpO2 94%   BMI 29.52 kg/m  Constitutional: sitting in chair comfortably, in no acute distress HENT: normocephalic  Neck: supple Cardiovascular: regular rate and rhythm Pulmonary/Chest: normal work of breathing on room air MSK: normal bulk and tone Neurological: alert & oriented x 3, no focal deficits  Skin: warm and dry Psych: pleasant mood  Pertinent Labs, Studies, and Procedures:     Latest Ref Rng & Units 01/19/2022    3:31 AM 01/18/2022    3:47 AM 01/17/2022    4:25 AM  CBC  WBC 4.0 - 10.5 K/uL 6.8  8.0  7.0   Hemoglobin 12.0 - 15.0 g/dL 8.5  9.4  9.1   Hematocrit 36.0 - 46.0 % 28.2  32.2  32.1   Platelets 150 - 400 K/uL 193  204  212        Latest Ref Rng & Units 01/19/2022    3:31 AM 01/18/2022    3:47 AM 01/17/2022    4:25 AM  CMP  Glucose 70 - 99 mg/dL 229  131  248   BUN 8 - 23 mg/dL _1 Creatinine 0.44 - 1.00 mg/dL 1.13  1.20  1.21   Sodium 135 - 145 mmol/L 137  140  136   Potassium 3.5 - 5.1 mmol/L 4.3  4.4  4.0   Chloride 98 - 111 mmol/L 102  105  102   CO2 22 - 32 mmol/L _2 Calcium 8.9 - 10.3 mg/dL 9.5  9.2  8.8  MR BRAIN W WO CONTRAST  Result Date: 01/17/2022 CLINICAL DATA:  Evaluate for stroke. EXAM: MRI HEAD WITHOUT AND WITH CONTRAST TECHNIQUE: Multiplanar, multiecho pulse sequences of the brain and surrounding structures were obtained without and with intravenous contrast. CONTRAST:  5m GADAVIST GADOBUTROL 1 MMOL/ML IV SOLN COMPARISON:  CT Head 01/15/22 FINDINGS: Brain: Acute punctate right ACA territory infarct involving the medial cortex of the right frontal lobe (series 2, image 30). There is no evidence of hemorrhage. No hydrocephalus. There is sequela of moderate chronic microvascular ischemic change which includes brainstem. Generalized volume loss.  No contrast-enhancing lesions visualized. Extra-axial fluid collection Vascular: Normal flow voids. Skull and upper cervical spine: Normal marrow signal. Sinuses/Orbits: Bilateral lens replacement. Complete opacification of the left maxillary sinus. Other: None. IMPRESSION: Acute punctate infarct involving the medial cortex of the right frontal lobe. Electronically Signed   By: HMarin RobertsM.D.   On: 01/17/2022 11:57   DG Chest 1 View  Result Date: 01/16/2022 CLINICAL DATA:  Chest pain.  Generalized weakness. EXAM: CHEST  1 VIEW COMPARISON:  08/16/2021 FINDINGS: Previous median sternotomy and CABG procedure. Aortic atherosclerosis. Stable mild cardiac enlargement. Slight asymmetric elevation of the left hemidiaphragm. Blunting of the left costophrenic angle is identified which is favored to represent pleuroparenchymal scarring. No pleural fluid or airspace consolidation. Multiple right posterior and lateral rib fracture deformities are identified which appear unchanged from previous exam. IMPRESSION: 1. No acute cardiopulmonary abnormalities. 2. Chronic right posterior and lateral rib fracture deformities. 3. Pleural parenchymal scarring within the left lung base. Electronically Signed   By: TKerby MoorsM.D.   On: 01/16/2022 15:08     Discharge Instructions: Discharge Instructions     Ambulatory referral to Neurology   Complete by: As directed    Follow up with stroke clinic NP (Jessica Vanschaick or CCecille Rubin if both not available, consider SZachery Dauer or [place]. Thanks.   Diet - low sodium heart healthy   Complete by: As directed    Increase activity slowly   Complete by: As directed        Signed: ZAngelique Blonder DO 01/19/2022, 5:45 PM   Pager: [phone]

## 2022-01-20 ENCOUNTER — Other Ambulatory Visit (HOSPITAL_COMMUNITY): Payer: Self-pay

## 2022-01-20 ENCOUNTER — Telehealth: Payer: Self-pay

## 2022-01-20 ENCOUNTER — Telehealth: Payer: Self-pay | Admitting: *Deleted

## 2022-01-20 ENCOUNTER — Other Ambulatory Visit: Payer: Self-pay | Admitting: Internal Medicine

## 2022-01-20 DIAGNOSIS — E1142 Type 2 diabetes mellitus with diabetic polyneuropathy: Secondary | ICD-10-CM

## 2022-01-20 DIAGNOSIS — R296 Repeated falls: Secondary | ICD-10-CM

## 2022-01-20 NOTE — Progress Notes (Signed)
Placed DME referral order for walker with seat and bedside commode. Per CCM, was recommended in hospital dc summary but order not placed.

## 2022-01-20 NOTE — Patient Outreach (Signed)
  Care Coordination   Collaboration  Visit Note   01/20/2022 Name: Mirka Barbone MRN: [id number] DOB: October 13, 1950  Evans Lance Pendell is a 71 y.o. year old female who sees [name], [name], MD for primary care. I  collaborated with [place] at [employer] about DME.  What matters to the patients health and wellness today?  Walker and Commode    Goals Addressed               This Visit's Progress     COMPLETED: "I have not heard about my walker or commode delivery" (pt-stated)        Care Coordination Interventions: Collaborated with Tia at [facility]  regarding the status of her walker and commode.  Per Tia, she is going to contact [place], hospital liaison and have items delivered.          SDOH assessments and interventions completed:  Yes  SDOH Interventions Today    Flowsheet Row Most Recent Value  SDOH Interventions   Food Insecurity Interventions Intervention Not Indicated  Housing Interventions Intervention Not Indicated        Care Coordination Interventions:  Yes, provided   Follow up plan: No further intervention required.   Encounter Outcome:  Pt. Visit Completed

## 2022-01-20 NOTE — Telephone Encounter (Signed)
Telephone encounter was:  Successful.  01/20/2022 Name: [name] MRN: [id number] DOB: 07/31/1950  Evans Lance Tarr is a 71 y.o. year old female who is a primary care patient of Angelica Pou, MD . The community resource team was consulted for assistance with Transportation Needs  and seniors resources   Care guide performed the following interventions: Patient provided with information about care guide support team and interviewed to confirm resource needs.  Follow Up Plan:  No further follow up planned at this time. The patient has been provided with needed resources.  [facility] [phone] [address] , [place] [address] Email : Ashby Dawes. Greenauer-moran _0 .Azell Der DOB: [birth year] MRN: [id number]   RIDER WAIVER AND RELEASE OF LIABILITY  For purposes of improving physical access to our facilities, [facility] is pleased to partner with third parties to provide [facility] patients or other authorized individuals the option of convenient, on-demand ground transportation services (the Technical brewer") through use of the technology service that enables users to request on-demand ground transportation from independent third-party providers.  By opting to use and accept these [employer], I, the undersigned, hereby agree on behalf of myself, and on behalf of any minor child using the Government social research officer for whom I am the parent or legal guardian, as follows:  Government social research officer provided to me are provided by independent third-party transportation providers who are not [employer] or employees and who are unaffiliated with [employer]. [facility] is neither a transportation carrier nor a common or public carrier. [facility] has no control over the quality or safety of the transportation that occurs as a result of the [employer]. [facility] cannot  guarantee that any third-party transportation provider will complete any arranged transportation service. [facility] makes no representation, warranty, or guarantee regarding the reliability, timeliness, quality, safety, suitability, or availability of any of the Transport Services or that they will be error free. I fully understand that traveling by vehicle involves risks and dangers of serious bodily injury, including permanent disability, paralysis, and death. I agree, on behalf of myself and on behalf of any minor child using the Transport Services for whom I am the parent or legal guardian, that the entire risk arising out of my use of the [employer] remains solely with me, to the maximum extent permitted under applicable law. The [employer] are provided "as is" and "as available." [facility] disclaims all representations and warranties, express, implied or statutory, not expressly set out in these terms, including the implied warranties of merchantability and fitness for a particular purpose. I hereby waive and release [facility], its agents, employees, officers, directors, representatives, insurers, attorneys, assigns, successors, subsidiaries, and affiliates from any and all past, present, or future claims, demands, liabilities, actions, causes of action, or suits of any kind directly or indirectly arising from acceptance and use of the [employer]. I further waive and release [place] and its affiliates from all present and future liability and responsibility for any injury or death to persons or damages to property caused by or related to the use of the [employer]. I have read this Waiver and Release of Liability, and I understand the terms used in it and their legal significance. This Waiver is freely and voluntarily given with the understanding that my right (as well as the right of any minor child for whom I am the parent  or legal guardian using the [employer]) to legal recourse against [facility] in connection with the [employer] is knowingly surrendered in return for use of these services.   I attest that I read the consent document to [name], gave Ms. Demorest the opportunity to ask questions and answered the questions asked (if any). I affirm that [name] then provided consent for she's participation in this program.     Bonnielee Haff

## 2022-01-20 NOTE — Telephone Encounter (Signed)
INTERNAL MEDICINE TEACHING ATTENDING ADDENDUM   I agree with pharmacy recommendations as outlined in their note. INR goal change is noted.  Dorian Pod, MD

## 2022-01-20 NOTE — Patient Outreach (Signed)
  Care Coordination TOC Note Transition Care Management Follow-up Telephone Call Date of discharge and from where: [name] 01/16/22-01/19/22 How have you been since you were released from the hospital? "I am doing all right, was resting". Any questions or concerns? Yes- I have not heard about my walker or commode.  This Probation officer to reach out to [employer], for update on DME  Items Reviewed: Did the pt receive and understand the discharge instructions provided? Yes  Medications obtained and verified? Yes  Other? No  Any new allergies since your discharge? No  Dietary orders reviewed? Yes Do you have support at home? No - Pt refused home health.  Notified her of the benefit from [facility] which would provide home health aide free of charge after discharge for 20 hours.  Home Care and Equipment/Supplies: Were home health services ordered? no If so, what is the name of the agency? N/a  Has the agency set up a time to come to the patient's home? no Were any new equipment or medical supplies ordered?  No What is the name of the medical supply agency? N/a Were you able to get the supplies/equipment? not applicable Do you have any questions related to the use of the equipment or supplies? No  Functional Questionnaire: (I = Independent and D = Dependent) ADLs: I  Bathing/Dressing- I  Meal Prep- I  Eating- I  Maintaining continence- I  Transferring/Ambulation- I  Managing Meds- I  Follow up appointments reviewed:  PCP Hospital f/u appt confirmed? Yes  Scheduled to see Dr. [name] on 01/23/22 @ 0845. [facility] f/u appt confirmed? No   Are transportation arrangements needed? Yes -Referred for transportation assistance If their condition worsens, is the pt aware to call PCP or go to the Emergency Dept.? Yes Was the patient provided with contact information for the PCP's office or ED? Yes Was to pt encouraged to call back with questions or concerns? Yes  SDOH  assessments and interventions completed:   Yes SDOH Interventions Today    Flowsheet Row Most Recent Value  SDOH Interventions   Food Insecurity Interventions Intervention Not Indicated  Housing Interventions Intervention Not Indicated       Care Coordination Interventions:  Transportation arranged Plan to contact [facility] to follow up on supplies.   Encounter Outcome:  Pt. Visit Completed

## 2022-01-20 NOTE — Patient Outreach (Signed)
  Care Coordination   Follow Up Visit Note   01/20/2022 Name: Kinleigh Nault MRN: [id number] DOB: 04/15/1950  Evans Lance Saddler is a 71 y.o. year old female who sees [name], [name], MD for primary care. I spoke with  [name] by phone today.  What matters to the patients health and wellness today?  Obtaining DME    Goals Addressed               This Visit's Progress     COMPLETED: "I have not heard about my walker or commode delivery" (pt-stated)        Care Coordination Interventions: Collaborated with Tia at [facility]  regarding the status of her walker and commode.  Per Tia, she is going to contact [facility], hospital liaison and have items delivered. Received call that [employer] did not take HTA, patients insurance.  Called patient and she wants the order sent to [facility] at [phone].  Called and per [name], they know the patient and will take care of the orders.  Confirmed with patient.          SDOH assessments and interventions completed:  Yes  SDOH Interventions Today    Flowsheet Row Most Recent Value  SDOH Interventions   Food Insecurity Interventions Intervention Not Indicated  Utilities Interventions Intervention Not Indicated        Care Coordination Interventions:  Yes, provided   Follow up plan:  Follow up with patient and let her know of acceptance of insurance from [facility].    Encounter Outcome:  Pt. Visit Completed

## 2022-01-20 NOTE — Telephone Encounter (Signed)
Recent hospitalization with new diagnosis of CVA. New INR target range as stated by the patient is 2.0 - 2.5.  Patient has been instructed to take one (1) of her 2.5 mg strength, green-colored warfarin tablets on Mondays, Wednesdays, Fridays and Sundays. On Tuesdays, Thursdays and Saturdays--take only one-half (1/2) of your 2.5 mg strength green colored warfarin tablets. Repeat PST FS POC INR on Monday 18-DEC-23 and provide results to me. She reports no bleeding signs or symptoms since arrival to home. No new embolic symptoms.

## 2022-01-21 ENCOUNTER — Other Ambulatory Visit: Payer: Self-pay | Admitting: Internal Medicine

## 2022-01-21 ENCOUNTER — Other Ambulatory Visit (HOSPITAL_COMMUNITY): Payer: Self-pay

## 2022-01-21 ENCOUNTER — Telehealth: Payer: Self-pay | Admitting: Dietician

## 2022-01-21 DIAGNOSIS — E1140 Type 2 diabetes mellitus with diabetic neuropathy, unspecified: Secondary | ICD-10-CM

## 2022-01-21 MED ORDER — NOVOLOG FLEXPEN 100 UNIT/ML ~~LOC~~ SOPN
5.0000 [IU] | PEN_INJECTOR | Freq: Three times a day (TID) | SUBCUTANEOUS | 0 refills | Status: DC
  Filled 2022-01-21: qty 15, 100d supply, fill #0

## 2022-01-21 MED ORDER — TRESIBA FLEXTOUCH 100 UNIT/ML ~~LOC~~ SOPN
48.0000 [IU] | PEN_INJECTOR | Freq: Every day | SUBCUTANEOUS | 5 refills | Status: DC
  Filled 2022-01-21: qty 12, 25d supply, fill #0
  Filled 2022-02-11: qty 12, 25d supply, fill #1
  Filled 2022-03-13: qty 12, 25d supply, fill #2
  Filled 2022-04-19 (×2): qty 12, 25d supply, fill #3
  Filled 2022-05-18: qty 12, 25d supply, fill #4
  Filled 2022-06-09: qty 12, 25d supply, fill #5

## 2022-01-21 NOTE — Telephone Encounter (Signed)
Got out of the hospital 2 days ago, on steroids, sugars are elevated, right now 293/291 with trend arrow flat, took 3 units Novolog twice yesterday before her only two meals and once today before eating and took 45 units [place] yesterday and today. Took Ozempic yesterday.  Broke out in sweats, felt low but it was high.  Generally not feeling well. Would like to know if she should do anything differently to help lower her blood sugars. Okay to leave a message. Has an appointment with Dr. [name] on 01/23/22 in two days.

## 2022-01-21 NOTE — Telephone Encounter (Signed)
Patient notified per Dr. [name] orders to adjust her Novolog and [name] doses when eating at least 50% and on prednisone taper.

## 2022-01-22 ENCOUNTER — Other Ambulatory Visit: Payer: Self-pay | Admitting: Internal Medicine

## 2022-01-22 ENCOUNTER — Telehealth: Payer: Self-pay | Admitting: Internal Medicine

## 2022-01-22 ENCOUNTER — Telehealth: Payer: Self-pay | Admitting: Dietician

## 2022-01-22 DIAGNOSIS — J310 Chronic rhinitis: Secondary | ICD-10-CM

## 2022-01-22 DIAGNOSIS — J449 Chronic obstructive pulmonary disease, unspecified: Secondary | ICD-10-CM

## 2022-01-22 NOTE — Telephone Encounter (Signed)
Blood sugar is down to 100. Wants to review the insulin she is supposed to  be taking. She is taking it correctly.

## 2022-01-23 ENCOUNTER — Other Ambulatory Visit: Payer: Self-pay

## 2022-01-23 ENCOUNTER — Telehealth: Payer: Self-pay

## 2022-01-23 ENCOUNTER — Ambulatory Visit (INDEPENDENT_AMBULATORY_CARE_PROVIDER_SITE_OTHER): Payer: HMO | Admitting: Internal Medicine

## 2022-01-23 ENCOUNTER — Encounter: Payer: Self-pay | Admitting: *Deleted

## 2022-01-23 ENCOUNTER — Ambulatory Visit: Payer: Self-pay | Admitting: Licensed Clinical Social Worker

## 2022-01-23 ENCOUNTER — Other Ambulatory Visit (HOSPITAL_COMMUNITY): Payer: Self-pay

## 2022-01-23 ENCOUNTER — Ambulatory Visit: Payer: Self-pay | Admitting: *Deleted

## 2022-01-23 VITALS — BP 164/65 | HR 62 | Temp 97.7°F | Ht 62.0 in | Wt 160.1 lb

## 2022-01-23 DIAGNOSIS — I639 Cerebral infarction, unspecified: Secondary | ICD-10-CM

## 2022-01-23 DIAGNOSIS — E118 Type 2 diabetes mellitus with unspecified complications: Secondary | ICD-10-CM

## 2022-01-23 DIAGNOSIS — R42 Dizziness and giddiness: Secondary | ICD-10-CM

## 2022-01-23 DIAGNOSIS — F17210 Nicotine dependence, cigarettes, uncomplicated: Secondary | ICD-10-CM

## 2022-01-23 DIAGNOSIS — I480 Paroxysmal atrial fibrillation: Secondary | ICD-10-CM | POA: Diagnosis not present

## 2022-01-23 DIAGNOSIS — J310 Chronic rhinitis: Secondary | ICD-10-CM

## 2022-01-23 DIAGNOSIS — I10 Essential (primary) hypertension: Secondary | ICD-10-CM

## 2022-01-23 DIAGNOSIS — J449 Chronic obstructive pulmonary disease, unspecified: Secondary | ICD-10-CM

## 2022-01-23 DIAGNOSIS — D509 Iron deficiency anemia, unspecified: Secondary | ICD-10-CM | POA: Diagnosis not present

## 2022-01-23 DIAGNOSIS — R296 Repeated falls: Secondary | ICD-10-CM

## 2022-01-23 DIAGNOSIS — R413 Other amnesia: Secondary | ICD-10-CM

## 2022-01-23 MED ORDER — IPRATROPIUM BROMIDE 0.03 % NA SOLN
2.0000 | Freq: Three times a day (TID) | NASAL | 2 refills | Status: DC
  Filled 2022-01-23: qty 30, 50d supply, fill #0
  Filled 2022-03-13: qty 30, 50d supply, fill #1
  Filled 2022-05-18: qty 30, 50d supply, fill #2

## 2022-01-23 NOTE — Assessment & Plan Note (Signed)
INR goal changed to.  Discussion during hospitalization and with me today at about opportunity to change from Coumadin to [place] for simplicity.  She wishes to remain on Coumadin given her comfort level with it.

## 2022-01-23 NOTE — Progress Notes (Signed)
Hospital f/u for [place], admitted exactly one week ago for a few days to evaluate dizziness and to receive blood and iron for worsening anemia, found actually to have vertigo which responded to [place] (Neurology  also recommended a short prednisone taper for her vertigo, and to have a small R frontal stroke on MRI.  During hospitalization, there was discussion about changing from coumadin to [place] which she declined given her satisfaction with her current regimen.    Has not taken medications today.  Dr. [name] office is ordering new neb machine for her.   Hospital had ordered a new BSC and rollator; nurse case manager is assisting with f/u, should be delivered today.  Transportation today provided by [employer] ride service through [facility].    BP (!) 164/65 (BP Location: Right Arm, Patient Position: Sitting, Cuff Size: Small)   Pulse 62   Temp 97.7 F (36.5 C) (Oral)   Ht _0  (1.575 m)   Wt 160 lb 1.6 oz (72.6 kg)   SpO2 97%   BMI 29.28 kg/m  Bright affect, quick to laugh, breathing easily, good color, no LE edema, in fact skin turgor is generally reduced.  Assessment and plan:  Essential hypertension BP uncontrolled due to not taking medications this morning (early appointment) 164/65.  Importance of adherence discussed.  PAF (paroxysmal atrial fibrillation) (HCC) INR goal changed to.  Discussion during hospitalization and with me today at about opportunity to change from Coumadin to [place] for simplicity.  She wishes to remain on Coumadin given her comfort level with it.  Acute CVA (cerebrovascular accident) ([place]) Asymptomatic.  Continue to work toward risk factor control.  Ongoing smoking is her greatest risk factor.  COPD mixed type [facility]) Patient received a call this morning that her replacement nebulizer machine is being ordered.  Breathing comfortably.  Type 2 diabetes mellitus with complications (HCC) Hypeglycemia due to the short prednisone taper.Two days ago tresiba  increased from 45 to 48 units nightly (first dose last night, fasting CBG 90 today)  and meal coverage new, short-term during prednisone) from 3 to 5 units.  Please more prednisone, after which she will discontinue meal coverage.  Will need to determine whether [name] needs to be reduced as well.  Chronic iron deficiency anemia 1 unit PRBC and IV iron Feraheme administered in the hospital, tolerated well.  Improved color and energy (could also be the prednisone for the latter).  Recheck hgb today.  Vertigo Onset 01/2022, symptomatic when turning head and sitting up and lying down, felt to be peripheral, responded well to Antivert in the hospital.  Only 1 mild recurrence since discharge, resolved immediately with Antivert.  At this time, vestibular rehab is not necessary given resolution.  Neurology consultation during hospitalization also recommended a short prednisone taper which she is currently taking.  Recurrent falls Most recently it sounds as though her falls were due to vertigo.  After resolution, she has had no additional falls or near falls, despite no changes in her polypharmacy.  Today she will receive delivery of a BSC and a walker.  Memory difficulties To be discussed next visit, barring more acute concerns.  Perennial non-allergic rhinitis Constant drip of watery nasal secretions, year-long, no response to steroid nasal spray.  Trial Atrovent to help dry secretions.   Next visit 2 weeks.

## 2022-01-23 NOTE — Assessment & Plan Note (Signed)
Patient received a call this morning that her replacement nebulizer machine is being ordered.  Breathing comfortably.

## 2022-01-23 NOTE — Assessment & Plan Note (Signed)
Hypeglycemia due to the short prednisone taper.Two days ago tresiba increased from 45 to 48 units nightly (first dose last night, fasting CBG 90 today)  and meal coverage new, short-term during prednisone) from 3 to 5 units.  Please more prednisone, after which she will discontinue meal coverage.  Will need to determine whether [name] needs to be reduced as well.

## 2022-01-23 NOTE — Assessment & Plan Note (Signed)
1 unit PRBC and IV iron administered in the hospital, tolerated well.  Improved color and energy (could also be the prednisone for the latter).  Recheck hgb today.

## 2022-01-23 NOTE — Assessment & Plan Note (Signed)
Constant drip of watery nasal secretions, year-long, no response to steroid nasal spray.  Trial Atrovent to help dry secretions.

## 2022-01-23 NOTE — Patient Outreach (Signed)
  Care Coordination   Follow Up Visit Note   01/23/2022 Name: [name] MRN: [id number] DOB: September 21, 1950  [name] is a 71 y.o. year old female who sees [name], [name], MD for primary care. I  sent more information to [facility] for DME.  What matters to the patients health and wellness today?  Obtaining DME equipment    Goals Addressed               This Visit's Progress     COMPLETED: "I have not heard about my walker or commode delivery" (pt-stated)        Care Coordination Interventions: Collaborated with Tia at [facility]  regarding the status of her walker and commode.  Per Tia, she is going to contact [facility], hospital liaison and have items delivered. Received call that [employer] did not take HTA, patients insurance.  Called patient and she wants the order sent to [facility] at [phone].  Called and per [name], they know the patient and will take care of the orders.  Confirmed with patient. 01/22/22-Patient called and left message she had not received DME. 01/23/22-0830-Spoke with [name] and [name] at [facility] and assisted them in finding order which was in referral section. [name] was able to find and get the information he needed and will call patient back to hopefully deliver today. 0209-Incoming call from [name] that they needed more information to submit to insurance regarding patient being room confined for commode and why she needed the walker. Sent via [facility] PCP note from today and evaluation by physical therapist at hospital.          SDOH assessments and interventions completed:  Yes     Care Coordination Interventions:  Yes, provided   Follow up plan:  Follow up to ensure delivery of DME    Encounter Outcome:  Pt. Visit Completed

## 2022-01-23 NOTE — Assessment & Plan Note (Signed)
To be discussed next visit, barring more acute concerns.

## 2022-01-23 NOTE — Assessment & Plan Note (Signed)
Most recently it sounds as though her falls were due to vertigo.  After resolution, she has had no additional falls or near falls, despite no changes in her polypharmacy.  Today she will receive delivery of a BSC and a walker.

## 2022-01-23 NOTE — Assessment & Plan Note (Signed)
Onset 01/2022, symptomatic when turning head and sitting up and lying down, felt to be peripheral, responded well to Antivert in the hospital.  Only 1 mild recurrence since discharge, resolved immediately with Antivert.  At this time, vestibular rehab is not necessary given resolution.  Neurology consultation during hospitalization also recommended a short prednisone taper which she is currently taking.

## 2022-01-23 NOTE — Patient Instructions (Signed)
[place], You're looking so much better!  We talked about your dizziness (has resolved for now), your anemia (recheck blood today), your blood thinner choice (you prefer to stay on coumadin), and your drippy nose (we will try Atrovent spray to replace the Flonase spray).  Your blood pressure was high today because you didn't have an opportunity to take your medicines before the visit.  We will get together in two weeks to review, and hopefully to spend some time looking into your memory frustrations. My best, Dr. [name]

## 2022-01-23 NOTE — Assessment & Plan Note (Signed)
BP uncontrolled due to not taking medications this morning (early appointment) 164/65.  Importance of adherence discussed.

## 2022-01-23 NOTE — Telephone Encounter (Signed)
Called patient and she is asking for nebulizer machine. It was noted on last AVS that she could get one. Order placed. Nothing further needed

## 2022-01-23 NOTE — Patient Outreach (Signed)
  Care Coordination   Follow Up Visit Note   01/23/2022 Name: [name] MRN: [id number] DOB: Feb 13, 1950  [name] is a 71 y.o. year old female who sees [name], [name], MD for primary care. I  received a voicemail from patient after work on 01/22/22 noting she has not received the DME supplies.  I called [facility] at [phone] and spoke with [name]. She was going to look into it and call back.  Received a call from [name] at [facility] stating he could not find the order in [place].  Explained the order is in "referral section" and he was able to find it.  Called and left voicemail for patient letting her know [name] would call her this afternoon.  What matters to the patients health and wellness today?  Delay in receiving DME supplies    Goals Addressed               This Visit's Progress     COMPLETED: "I have not heard about my walker or commode delivery" (pt-stated)        Care Coordination Interventions: Collaborated with Tia at [facility]  regarding the status of her walker and commode.  Per Tia, she is going to contact [facility], hospital liaison and have items delivered. Received call that [employer] did not take HTA, patients insurance.  Called patient and she wants the order sent to [facility] at [phone].  Called and per [name], they know the patient and will take care of the orders.  Confirmed with patient. 01/22/22-Patient called and left message she had not received DME. 01/23/22-Spoke with [name] and [name] at [facility] and assisted them in finding order which was in referral section. [name] was able to find and get the information he needed and will call patient back to hopefully deliver today.          SDOH assessments and interventions completed:  Yes  SDOH Interventions Today    Flowsheet Row Most Recent Value  SDOH Interventions   Food Insecurity Interventions Intervention Not Indicated  Housing Interventions Intervention Not Indicated         Care Coordination Interventions:  Yes, provided   Follow up plan: No further intervention required.   Encounter Outcome:  Pt. Visit Completed

## 2022-01-23 NOTE — Assessment & Plan Note (Signed)
Asymptomatic.  Continue to work toward risk factor control.  Ongoing smoking is her greatest risk factor.

## 2022-01-24 ENCOUNTER — Other Ambulatory Visit (HOSPITAL_COMMUNITY): Payer: Self-pay

## 2022-01-24 ENCOUNTER — Other Ambulatory Visit: Payer: Self-pay | Admitting: Internal Medicine

## 2022-01-24 DIAGNOSIS — G8929 Other chronic pain: Secondary | ICD-10-CM

## 2022-01-24 DIAGNOSIS — F419 Anxiety disorder, unspecified: Secondary | ICD-10-CM

## 2022-01-24 LAB — HEMOGLOBIN AND HEMATOCRIT, BLOOD
Hematocrit: 38.1 % (ref 34.0–46.6)
Hemoglobin: 11.5 g/dL (ref 11.1–15.9)

## 2022-01-24 MED ORDER — ALPRAZOLAM 1 MG PO TABS
0.5000 mg | ORAL_TABLET | Freq: Every evening | ORAL | 0 refills | Status: DC | PRN
  Filled 2022-01-24: qty 45, 45d supply, fill #0
  Filled 2022-01-27: qty 45, 30d supply, fill #0

## 2022-01-24 MED ORDER — POTASSIUM CHLORIDE CRYS ER 20 MEQ PO TBCR
20.0000 meq | EXTENDED_RELEASE_TABLET | ORAL | 3 refills | Status: DC
  Filled 2022-01-24: qty 270, 90d supply, fill #0
  Filled 2022-05-18: qty 270, 90d supply, fill #1
  Filled 2022-08-12: qty 270, 90d supply, fill #2

## 2022-01-24 MED ORDER — HYDROCODONE-ACETAMINOPHEN 5-325 MG PO TABS
1.0000 | ORAL_TABLET | Freq: Three times a day (TID) | ORAL | 0 refills | Status: DC
  Filled 2022-01-27 (×2): qty 180, 30d supply, fill #0

## 2022-01-24 NOTE — Telephone Encounter (Signed)
Last rx written 12/27/21. Last OV 01/23/22. Next OV 02/13/22. TOX  12/16/18.

## 2022-01-24 NOTE — Patient Outreach (Signed)
Care Coordination   Follow Up Visit Note- LATE ENTRY 01/23/22   01/24/2022 Name: [name] MRN: [id number] DOB: 1950-10-18  [name] is a 71 y.o. year old female who sees [name], [name], MD for primary care. I spoke with  [name] by phone today.  What matters to the patients health and wellness today?  Vertigo, SCAT transportation and improve mental health through support, counseling, resources and activity/socialization.    Goals Addressed             This Visit's Progress    Provide support, resources and mental health support       Care Coordination Interventions:  CSW spoke with pt who reports 2 ER visits- "I have vertigo". Pt states her sadness/depression are the same admitting; " I feel I don't have a life". Support offered and discussed ways to enhance her life by on-going counseling support, getting back involved in the community with [facility], etc and family/friends.  CSW assisting with completion of SCAT application to seek opportunities for her to be more active in community. Dr [name] to assist with paperwork.  Pt does report, "I feel better".  Previous conversation with pt about "sadness". Pt reported she has "lots to talk about" but nothing specific today. "I can be stubborn". . Pt lives alone, does not drive and has a sister, [name], who helps her with rides, getting groceries, RX, etc.  She also has a neighbor who is helpful with getting mail, etc.  She also reports receiving Meals on Wheels and what sounds like partial Medicaid. CSW reminded pt of her HTA benefits- reviewed several with her. Pt acknowledges having depression; PHQ9 depression screening completed today- pt scoring "14" (improved from "17").  Pt denies SI/HI. She is open to considering mental health counseling and is aware Milus Height, LCSW-A, will be providing this in PCP's office later this month.  Pt is also interested in getting information on life  alert, [place] activities in [place] and [place] application assistance. Will ask our [employer] to follow up with pt on these opportunities to enhance pt's overall quality of life/depression.      01/15/2022    1:50 PM 11/07/2021   11:44 AM 09/19/2021   11:55 AM 08/22/2021   11:16 AM 08/01/2021   11:04 AM  Depression screen PHQ 2/9  Decreased Interest 3 0 3 0 0  Down, Depressed, Hopeless 3 0 3 0 0  PHQ - 2 Score 6 0 6 0 0  Altered sleeping 3  3    Tired, decreased energy 3  3    Change in appetite 0  3    Feeling bad or failure about yourself  0  1    Trouble concentrating 2  1    Moving slowly or fidgety/restless 0  0    Suicidal thoughts 0  0    PHQ-9 Score 14  17    Difficult doing work/chores Somewhat difficult  Very difficult      Reviewed Care Coordination Services:  Assessed Social Determinants of Health Reviewed all upcoming appointments in Epic system Motivational Interviewing employed Depression screen reviewed  PHQ2/ PHQ9 completed Solution-Focused Strategies employed:  Active listening / Reflection utilized  Emotional Support Provided Problem [place] strategies reviewed Provided general psycho-education for mental health needs  Reviewed mental health medications and discussed importance of compliance:            SDOH assessments and interventions completed:  Yes  Care Coordination Interventions:  Yes, provided   Follow up plan: Follow up call scheduled for 02/12/22    Encounter Outcome:  Pt. Visit Completed

## 2022-01-24 NOTE — Patient Instructions (Signed)
Visit Information  Thank you for taking time to visit with me today. Please don't hesitate to contact me if I can be of assistance to you.   Following are the goals we discussed today:   Goals Addressed             This Visit's Progress    Provide support, resources and mental health support       Care Coordination Interventions:  CSW spoke with pt who reports 2 ER visits- "I have vertigo". Pt states her sadness/depression are the same admitting; " I feel I don't have a life". Support offered and discussed ways to enhance her life by on-going counseling support, getting back involved in the community with [facility], etc and family/friends.  CSW assisting with completion of SCAT application to seek opportunities for her to be more active in community. Dr [name] to assist with paperwork.  Pt does report, "I feel better".  Previous conversation with pt about "sadness". Pt reported she has "lots to talk about" but nothing specific today. "I can be stubborn". . Pt lives alone, does not drive and has a sister, [name], who helps her with rides, getting groceries, RX, etc.  She also has a neighbor who is helpful with getting mail, etc.  She also reports receiving Meals on Wheels and what sounds like partial Medicaid. CSW reminded pt of her HTA benefits- reviewed several with her. Pt acknowledges having depression; PHQ9 depression screening completed today- pt scoring "14" (improved from "17").  Pt denies SI/HI. She is open to considering mental health counseling and is aware Milus Height, LCSW-A, will be providing this in PCP's office later this month.  Pt is also interested in getting information on life alert, [place] activities in [place] and [place] application assistance. Will ask our [employer] to follow up with pt on these opportunities to enhance pt's overall quality of life/depression.      01/15/2022    1:50 PM 11/07/2021   11:44 AM 09/19/2021   11:55 AM  08/22/2021   11:16 AM 08/01/2021   11:04 AM  Depression screen PHQ 2/9  Decreased Interest 3 0 3 0 0  Down, Depressed, Hopeless 3 0 3 0 0  PHQ - 2 Score 6 0 6 0 0  Altered sleeping 3  3    Tired, decreased energy 3  3    Change in appetite 0  3    Feeling bad or failure about yourself  0  1    Trouble concentrating 2  1    Moving slowly or fidgety/restless 0  0    Suicidal thoughts 0  0    PHQ-9 Score 14  17    Difficult doing work/chores Somewhat difficult  Very difficult      Reviewed Care Coordination Services:  Assessed Social Determinants of Health Reviewed all upcoming appointments in Epic system Motivational Interviewing employed Depression screen reviewed  PHQ2/ PHQ9 completed Solution-Focused Strategies employed:  Active listening / Reflection utilized  Emotional Support Provided Problem [place] strategies reviewed Provided general psycho-education for mental health needs  Reviewed mental health medications and discussed importance of compliance:            Our next appointment is by telephone on 02/12/22  Please call the care guide team at [phone] if you need to cancel or reschedule your appointment.   If you are experiencing a Mental Health or [place] or need someone to talk to, please call the  [place] National Suicide Prevention Lifeline: (657)669-0197 or TTY: (716)876-7976 TTY 4704991903) to talk to a trained counselor call 911   Patient verbalizes understanding of instructions and care plan provided today and agrees to view in [place]. Active MyChart status and patient understanding of how to access instructions and care plan via MyChart confirmed with patient.     Telephone follow up appointment with care management team member scheduled for:02/12/22  Eduard Clos MSW, LCSW Licensed Clinical Social Worker    [phone]

## 2022-01-25 ENCOUNTER — Other Ambulatory Visit (HOSPITAL_COMMUNITY): Payer: Self-pay

## 2022-01-27 ENCOUNTER — Telehealth: Payer: Self-pay | Admitting: Pharmacist

## 2022-01-27 ENCOUNTER — Other Ambulatory Visit: Payer: Self-pay

## 2022-01-27 ENCOUNTER — Telehealth: Payer: Self-pay | Admitting: Student

## 2022-01-27 ENCOUNTER — Other Ambulatory Visit (HOSPITAL_COMMUNITY): Payer: Self-pay

## 2022-01-27 NOTE — Telephone Encounter (Signed)
4 AM received a page to call patient back.  Patient states that she rolled off and fell off her bed.  She is not sure if she hit her head but she thought that she may have hit her face.  It took a while for patient to get up to use the bathroom but she was able to ambulate after the fall.  Patient denies her hips, shoulders, elbows, wrists and ankles.  She states that her body feels sore.  Patient said that the reason she called is to have somebody to talk to.  Patient has a follow-up appointment with Dr. [name].  I encouraged patient to give [place] a call back later today if she experiences any worsening pain with joint dysfunction.

## 2022-01-27 NOTE — Telephone Encounter (Signed)
Patient provides result of PST FS POC INR for today as 2.2 (new therapeutic range established at last hospitalization for new CVA is 2.0 - 2.5). Instructed to CONTINUE same dosing regimen. Repeat PST FS POC INR 27-DEC-23.

## 2022-01-27 NOTE — Telephone Encounter (Signed)
Patient is requesting a call back regarding her incident.

## 2022-01-28 ENCOUNTER — Telehealth: Payer: Self-pay | Admitting: Dietician

## 2022-01-28 NOTE — Telephone Encounter (Signed)
Spoke to [facility] per triage nurse request about her diabetes medicines: she missed semaglutide x3 weeks, agreed to restart today Finished steroids yesterday, sugar 102 fasting today, now 224 after coffee with trend arrow going down. Has not taken any Novolog- today, decreased Tresiba to 45 units a day, over the next week plans to decrease it back to her usual 43 units daily.    she thinks she might be running a fever... forehead 97, ear 99.5, feels really yucky, clammy.  Advised to monitor and call us if fever continues x 24 hours.  Continue same insulin plan as above.

## 2022-01-30 ENCOUNTER — Other Ambulatory Visit (HOSPITAL_COMMUNITY): Payer: Self-pay

## 2022-01-30 ENCOUNTER — Other Ambulatory Visit: Payer: Self-pay

## 2022-01-30 ENCOUNTER — Telehealth: Payer: Self-pay | Admitting: Student

## 2022-01-30 ENCOUNTER — Ambulatory Visit (INDEPENDENT_AMBULATORY_CARE_PROVIDER_SITE_OTHER): Payer: HMO

## 2022-01-30 ENCOUNTER — Telehealth: Payer: Self-pay | Admitting: Dietician

## 2022-01-30 ENCOUNTER — Telehealth: Payer: Self-pay | Admitting: *Deleted

## 2022-01-30 VITALS — BP 139/40 | HR 76 | Temp 98.1°F | Ht 62.0 in | Wt 160.0 lb

## 2022-01-30 DIAGNOSIS — R296 Repeated falls: Secondary | ICD-10-CM

## 2022-01-30 DIAGNOSIS — F17210 Nicotine dependence, cigarettes, uncomplicated: Secondary | ICD-10-CM

## 2022-01-30 DIAGNOSIS — R1011 Right upper quadrant pain: Secondary | ICD-10-CM | POA: Diagnosis not present

## 2022-01-30 DIAGNOSIS — I5032 Chronic diastolic (congestive) heart failure: Secondary | ICD-10-CM

## 2022-01-30 MED ORDER — METOPROLOL SUCCINATE ER 25 MG PO TB24
25.0000 mg | ORAL_TABLET | Freq: Every day | ORAL | 11 refills | Status: DC
  Filled 2022-01-30 – 2022-04-22 (×5): qty 30, 30d supply, fill #0
  Filled 2022-04-23: qty 90, 90d supply, fill #0
  Filled 2022-07-15: qty 90, 90d supply, fill #1
  Filled 2022-08-12 – 2022-10-16 (×2): qty 90, 90d supply, fill #2
  Filled 2023-01-08: qty 90, 90d supply, fill #3

## 2022-01-30 NOTE — Telephone Encounter (Signed)
Call transferred to triage nurse

## 2022-01-30 NOTE — Telephone Encounter (Signed)
Call from patient abdominal pain since 1:00 PM on yesterday.  Some Nausea and vomiting took medication for that helped.  No fevers.  Weak.  [place] trying to get from bed to chair yesterday.  Slid down to floor. Some Vertigo thought about taking medication for.   Is taking a few bites of food now.  Would like to Dr. [name].  Patient offered an appointment for this morning at 9:45 AM.  To contact her sister to bring her in.

## 2022-01-30 NOTE — Assessment & Plan Note (Addendum)
Patient reports intermittent, sharp abdominal pain since yesterday evening. Reports the pain is below her rib cage on the right. No correlation with eating. Symptoms are worse with deep inspiration and certain positions. States pain is pretty severe. Denies vomiting, diarrhea. Denies fevers, chills. Patient reports history of gallstones. Afebrile. She is not jaundiced. Abdominal exam with mild tenderness to palpation in RUQ. Negative murphy's sign. Feel this most likely constipation, GERD, or possible radicular in nature.   Plan: Do not feel ultrasound warranted at this time. Will be able to assess T bili and LFTs with CMP. Advised Tylenol as needed for now.

## 2022-01-30 NOTE — Patient Instructions (Addendum)
[name], it was a pleasure seeing you today!  Today we discussed: Falls-Please use your walker once it's delivered. Take metoprolol 1/2 tablet daily. will call you back with your lab results.  Abdominal pain-please take Tylenol as needed for pain  I have ordered the following labs today:  Lab Orders  No laboratory test(s) ordered today     Tests ordered today:  CBC, CMP  Referrals ordered today:   Referral Orders  No referral(s) requested today     I have ordered the following medication/changed the following medications:   Stop the following medications: There are no discontinued medications.   Start the following medications: No orders of the defined types were placed in this encounter.    Follow-up:  as scheduled    Please make sure to arrive 15 minutes prior to your next appointment. If you arrive late, you may be asked to reschedule.   We look forward to seeing you next time. Please call our clinic at [phone] if you have any questions or concerns. The best time to call is Monday-Friday from 9am-4pm, but there is someone available 24/7. If after hours or the weekend, call the main hospital number and ask for the Internal Medicine Resident On-Call. If you need medication refills, please notify your pharmacy one week in advance and they will send [place] a request.  Thank you for letting us take part in your care. Wishing you the best!  Thank you, Linward Natal, MD

## 2022-01-30 NOTE — Telephone Encounter (Signed)
Received to call patient back.  Confirmed patient's birthday.  Patient was seen today with Dr. Dema Severin and Dr. Cain Sieve.  Her 2 main concerns are her abdominal pain and medications.  Patient states that she does not feel like taking the medications.  She took her [place] and warfarin tonight.  She said that she is on too many medications and today she just does not feel like taking the medications.  I expressed my sympathy with her concerns and she is on many medications.  I advised patient to take the medication as instructed especially the Lasix to avoid volume overload.  Patient verbalizes understanding.  She has an appointment with her PCP, Dr. [name], on 1/4 which she can discuss the possibility to reduce her polypharmacy.  Patient was seen this morning for abdominal pain on the right side.  She denies nausea, vomiting, constipation or diarrhea.  An ultrasound was not indicated this morning.  She said that her pain is persistent tonight.  I explained to patient that it is difficult for me to diagnose her condition over the phone.  Advised patient to go to the ED if pain is intolerable or worsening symptoms.  Patient verbalizes understanding.

## 2022-01-30 NOTE — Progress Notes (Addendum)
CC: falls, abdominal pain  HPI:  [name] is a 71 y.o. with past medical history as below who presents for falls. Please see detailed assessment and plan for HPI.  Past Medical History:  Diagnosis Date   Abnormality of lung on CXR 02/02/2020   Nonspecific finding on CXR ordered by pulmonologist - c/w inflammation vs infection, f/u imaging suggested, Dr. [name] office has been in communication about recommended next steps.   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   Non-bleeding when visualized on capsule endoscopy 06/30/2015    Arthritis    "lower back; hands" (02/19/2018)   Asthma    Asymptomatic cholelithiasis 09/25/2015   Seen on CT scan 08/2015   Carotid artery stenosis; s/p R endarterectomy    s/p right endarterectomy (06/2010) Carotid US (07/2010):  Left: Moderate-to-severe (60-79%) calcific and non-calcific plaque origin and proximal ICA and ECA    Chronic congestive heart failure with left ventricular diastolic dysfunction (HCC) 10/21/2010   Chronic constipation 02/03/2011   Chronic daily headache 01/16/2014   Chronic iron deficiency anemia    Chronic low back pain 10/06/2012   Chronic venous insufficiency 08/04/2012   Closed fracture of one rib of left side 08/23/2021   ED visit after a fall 08/16/21    COPD (chronic obstructive pulmonary disease) with emphysema ([place])    PFTs 2018: severe obstructive disease, insignif response to bronchiodilator, mild restriction parenchymal pattern, moderately severe diffusion defect. 2014  FEV1 0.92 (40%), ratio 69, 27% increase in FEV1 with BD, TLC 91%, severe airtrapping, DLCO49% On chronic home O2. Pulmonary rehab referral 05/2012    Dyspnea    Fibromyalgia 08/29/2010   Gastroesophageal reflux disease    History of blood transfusion    "several times"  (02/19/2018)   History of clear cell  renal cell carcinoma ([place]), in remission 07/21/2011   s/p cryoablation of left RCC in 09/2011 by [name]. Followed by [name]  [facility]) .     History of hiatal hernia    History of mitral valve replacement with bioprosthetic valve due to mitral stenosis 2012   s/p MVR with a 27-mm pericardial porcine valve (Medtronic Mosaic valve, serial #[id number] on 09/20/10, Dr. Prescott Gum)    History of obstructive sleep apnea, resolved 2013   resolved per sleep study 07/2019; no apnea, but did have desaturation.  CPAP no longer necessary.  Nocturnal polysomnography (06/2009): Moderate sleep apnea/ hypopnea syndrome , AHI 17.8 per hour with nonpositional hypopneas. CPAP titration to 12 CWP, AHI 2.4 per hour. On nocturnal CPAP via a small resMed Quattro full-face mask with heated humidifier.    History of pneumonia    "once"  (02/19/2018)   History of seborrheic keratosis 09/28/2015   Hyperlipidemia LDL goal < 100 11/20/2005   Internal and external hemorrhoids without complication 41/28/7867   Lesion of left native kidney 06/01/2020   Incidental finding on recent screening chest CT for lung ca ordered by Dr. Annamaria Boots pulmonology - he has ordered a f/u renal U/S.    Lichen sclerosus of female genitalia 01/12/2017   Migraine    "none in years" (02/19/2018)   Moderately severe major depression ([place]) 11/19/2005   Nocturnal hypoxia per sleep study 07/2019    Osteoporosis    DEXA 2016: T -2.7; DEXA (12/09/2011): L-spine T -3.7, left hip T -1.4 DEXA (12/2004): L-spine T -2.6, left hip -0.1    Paroxysmal  atrial fibrillation ([place]) 10/22/2010   s/p Left atrial maze procedure for paroxysmal atrial fibrillation on 09/20/2010 by Dr Prescott Gum.  Subsequent splenic infarct, decision was made to re-anticoagulate with coumadin, likely life-long as this is the most likely cause of the splenic infarct.    Personal history of colonic polyps 05/14/2011   Colonoscopy (05/2011): 4 mm adenomatous polyp excised  endoscopically Colonoscopy (02/2002): Adenomatous polyp excised endoscopically    Personal history of renal cell carcinoma 09/12/2020   Pneumonia    Pulmonary hypertension due to chronic obstructive pulmonary disease ([place]) 04/25/2016   2014 TEE w PA peak pressure 46 mmHg, s/p MV replacement    Right nephrolithiasis, asymptomatic, incidental finding 09/06/2014   5 mm non-obstructing calculus seen on CT scan 09/05/2014    Right ventricular failure ([place]) 04/25/2016   Severe obesity (BMI 35.0-39.9) with comorbidity ([place]) 10/23/2011   Sleep apnea    Tobacco abuse 07/28/2012   Type 2 diabetes mellitus with diabetic neuropathy ([place])    Review of Systems:  See detailed assessment and plan for pertinent ROS.  Physical Exam:  Vitals:   01/30/22 1030 01/30/22 1134 01/30/22 1135  BP: (!) 137/57 (!) 147/42 (!) 139/40  Pulse: 77 74 76  Temp: 98.1 F (36.7 C)    TempSrc: Oral    SpO2: 94%    Weight: 160 lb (72.6 kg)    Height: _0  (1.575 m)     Physical Exam Constitutional:      General: She is not in acute distress. HENT:     Head: Normocephalic and atraumatic.  Eyes:     Extraocular Movements: Extraocular movements intact.  Cardiovascular:     Rate and Rhythm: Normal rate and regular rhythm.     Pulses: Normal pulses.  Pulmonary:     Effort: Pulmonary effort is normal.     Breath sounds: No wheezing, rhonchi or rales.  Abdominal:     General: Abdomen is flat. There is no distension.     Palpations: Abdomen is soft.     Comments: RUQ tenderness. Negative Murphy's sign.   Musculoskeletal:     Cervical back: Neck supple.     Comments: 1+ pitting edema at bilateral lower extremities   Skin:    General: Skin is warm and dry.     Findings: No bruising.  Neurological:     Mental Status: She is alert and oriented to person, place, and time.     Cranial Nerves: No cranial nerve deficit.     Sensory: No sensory deficit.     Motor: No weakness.  Psychiatric:        Mood and Affect:  Mood normal.        Behavior: Behavior normal.      Assessment & Plan:   See Encounters Tab for problem based charting.  Recurrent falls Assessment & Plan: Patient presents after having fallen at least 3 times since recent hospital discharge. She does not have any prodrome but reports that she may feel dizzy during and after the fall. She is able to reach out and grab something each time. She does state she feels dizzy when she stands up sometimes. She has taken meclizine a few times but doesn't feel that it stops her falling. She has not taken it prophylactally however.   Plan: Orthostatics negative though diastolics in the 16X. Will decrease metoprolol to 25 mg daily. Encouraged her to use her walker after it is delivered today. Will obtain CBC and CMP to assess anemia and  electrolytes. She takes lasix but would like not to decrease dose given lower leg edema.   Orders: -     CMP14 + Anion Gap -     CBC  Chronic congestive heart failure with left ventricular diastolic dysfunction (HCC) -     Metoprolol Succinate ER; Take 1 tablet (25 mg total) by mouth daily.  Dispense: 30 tablet; Refill: 11  Right upper quadrant abdominal pain Assessment & Plan: Patient reports intermittent, sharp abdominal pain since yesterday evening. Reports the pain is below her rib cage on the right. No correlation with eating. Symptoms are worse with deep inspiration and certain positions. States pain is pretty severe. Denies vomiting, diarrhea. Denies fevers, chills. Patient reports history of gallstones. Afebrile. She is not jaundiced. Abdominal exam with mild tenderness to palpation in RUQ. Negative murphy's sign. Feel this most likely constipation, GERD, or possible radicular in nature.   Plan: Do not feel ultrasound warranted at this time. Will be able to assess T bili and LFTs with CMP. Advised Tylenol as needed for now.      Patient seen with Dr.  Cain Sieve.    Internal Medicine Clinic Attending  I  saw and evaluated the patient.  I personally confirmed the key portions of the history and exam documented by Dr. Dema Severin and I reviewed pertinent patient test results.  The assessment, diagnosis, and plan were formulated together and I agree with the documentation in the resident's note.

## 2022-01-30 NOTE — Assessment & Plan Note (Signed)
Patient presents after having fallen at least 3 times since recent hospital discharge. She does not have any prodrome but reports that she may feel dizzy during and after the fall. She is able to reach out and grab something each time. She does state she feels dizzy when she stands up sometimes. She has taken meclizine a few times but doesn't feel that it stops her falling. She has not taken it prophylactally however.   Plan: Orthostatics negative though diastolics in the 66Y. Will decrease metoprolol to 25 mg daily. Encouraged her to use her walker after it is delivered today. Will obtain CBC and CMP to assess anemia and electrolytes. She takes lasix but would like not to decrease dose given lower leg edema.

## 2022-01-30 NOTE — Telephone Encounter (Signed)
In pain, yesterday felt like something poking her from the inside out. Still there today, when she tried to get out of bed and slid to the floor. Weak, trying to get breakfast. Transfer to triage nurse.

## 2022-01-31 LAB — CMP14 + ANION GAP
ALT: 9 IU/L (ref 0–32)
AST: 10 IU/L (ref 0–40)
Albumin/Globulin Ratio: 1.8 (ref 1.2–2.2)
Albumin: 3.7 g/dL — ABNORMAL LOW (ref 3.8–4.8)
Alkaline Phosphatase: 54 IU/L (ref 44–121)
Anion Gap: 13 mmol/L (ref 10.0–18.0)
BUN/Creatinine Ratio: 23 (ref 12–28)
BUN: 24 mg/dL (ref 8–27)
Bilirubin Total: 0.3 mg/dL (ref 0.0–1.2)
CO2: 26 mmol/L (ref 20–29)
Calcium: 9.4 mg/dL (ref 8.7–10.3)
Chloride: 101 mmol/L (ref 96–106)
Creatinine, Ser: 1.05 mg/dL — ABNORMAL HIGH (ref 0.57–1.00)
Globulin, Total: 2.1 g/dL (ref 1.5–4.5)
Glucose: 213 mg/dL — ABNORMAL HIGH (ref 70–99)
Potassium: 5.1 mmol/L (ref 3.5–5.2)
Sodium: 140 mmol/L (ref 134–144)
Total Protein: 5.8 g/dL — ABNORMAL LOW (ref 6.0–8.5)
eGFR: 56 mL/min/{1.73_m2} — ABNORMAL LOW (ref >59)

## 2022-01-31 LAB — CBC
Hematocrit: 37.7 % (ref 34.0–46.6)
Hemoglobin: 11.5 g/dL (ref 11.1–15.9)
MCH: 24.7 pg — ABNORMAL LOW (ref 26.6–33.0)
MCHC: 30.5 g/dL — ABNORMAL LOW (ref 31.5–35.7)
MCV: 81 fL (ref 79–97)
Platelets: 213 10*3/uL (ref 150–450)
RBC: 4.65 x10E6/uL (ref 3.77–5.28)
RDW: 24.3 % — ABNORMAL HIGH (ref 11.7–15.4)
WBC: 10.3 10*3/uL (ref 3.4–10.8)

## 2022-02-01 ENCOUNTER — Encounter: Payer: Self-pay | Admitting: Internal Medicine

## 2022-02-06 ENCOUNTER — Other Ambulatory Visit: Payer: Self-pay

## 2022-02-06 NOTE — Telephone Encounter (Signed)
Patient provided me results of patient self testing, finger stick, point of care INR determination at 1514 hours today = 3.3. This INR is based on 12.5 milligrams of warfarin per week as:  Su-1.25mgM-2.5mgT-1.25mgW-2.5mgTh-1.25mgF-2.5mgSa-1.25mg   Using her 2.5 milligram strength, green-colored warfarin tablets. No missed doses, no extra doses, no new antibiotics she states. States she has been on "8-10" days of prednisone, of which the last dose was taken on 01/24/22. She endorses a 4 pound weight loss. She denies any signs or symptoms of bleeding (no nosebleeds, bruising, coughing up blood, throwing up bleed or passing bright read blood or dark tarry stools from her rectum.)   Review of notes indicates she is to be seen in the [facility] by Dr. Williams on 4-JAN-24.   In the interim, I have advised her to OMIT today's dose of warfarin. Recommence on Friday, Decemeber 29 by taking one (1) of her 2.5 milligram strength warfarin tablets; take only one-half (1/2) tablet on Saturday December 30, and Sunday February 09, 2022. She is to repeat a PST FS POC INR on Monday 1-JAN-24 and report value to me to address and determine dose, based upon response. 

## 2022-02-06 NOTE — Telephone Encounter (Signed)
Patient provided me results of patient self testing, finger stick, point of care INR determination at 1514 hours today = 3.3. This INR is based on 12.5 milligrams of warfarin per week as:  Su-1.35mM-2.5mgT-1.25mgW-2.5mgTh-1.25mgF-2.5mgSa-1.25mg   Using her 2.5 milligram strength, green-colored warfarin tablets. No missed doses, no extra doses, no new antibiotics she states. States she has been on "8-10" days of prednisone, of which the last dose was taken on 01/24/22. She endorses a 4 pound weight loss. She denies any signs or symptoms of bleeding (no nosebleeds, bruising, coughing up blood, throwing up bleed or passing bright read blood or dark tarry stools from her rectum.)   [facility].   In the interim, I have advised her to OMIT today's dose of warfarin. Recommence on Friday, Decemeber 29 by taking one (1) of her 2.5 milligram strength warfarin tablets; take only one-half (1/2) tablet on Saturday December 30, and Sunday February 09, 2022. She is to repeat a PST FS POC INR on Monday 1-JAN-24 and report value to me to address and determine dose, based upon response.

## 2022-02-07 ENCOUNTER — Other Ambulatory Visit: Payer: Self-pay

## 2022-02-10 DIAGNOSIS — G4733 Obstructive sleep apnea (adult) (pediatric): Secondary | ICD-10-CM | POA: Diagnosis not present

## 2022-02-10 NOTE — Telephone Encounter (Signed)
Patient reported results of patient self testing, point of care, finger-stick INR = 4.7 today. This, after last value reported on 28-DEC-23 at which time she was advised that day to OMIT/HOLD that days dose, which she did. She resumed the following day taking 2.5 milligrams of warfarin on 29-DEC-23. On 30/31 DEC-23 she took 1/2 x 2.5 milligrams (1.25 milligram dose) for two day. Her INR today is as described above. She denies any new drugs, (no antibiotics), she denies any signs or symptoms of bleeding or bruising.   She has been advised to OMIT warfarin today 02-10-22 and tomorrow, 02-11-22. Repeat PST FS POC INR on 02-12-22. She sees Dr. [name] in the [facility] on 02-13-22. May wish to evaluate a serum albumin level, since warfarin is a highly protein bound drug. If the patient is hypoalbunemic, this may account for the hypoprothrombinemic response on a dose that had previously provided stable dose responsiveness.

## 2022-02-11 ENCOUNTER — Other Ambulatory Visit: Payer: Self-pay | Admitting: Internal Medicine

## 2022-02-11 ENCOUNTER — Other Ambulatory Visit: Payer: Self-pay

## 2022-02-11 DIAGNOSIS — J439 Emphysema, unspecified: Secondary | ICD-10-CM

## 2022-02-12 ENCOUNTER — Other Ambulatory Visit: Payer: Self-pay

## 2022-02-12 ENCOUNTER — Ambulatory Visit: Payer: Self-pay | Admitting: *Deleted

## 2022-02-12 ENCOUNTER — Other Ambulatory Visit (HOSPITAL_COMMUNITY): Payer: Self-pay

## 2022-02-12 MED ORDER — ALBUTEROL SULFATE HFA 108 (90 BASE) MCG/ACT IN AERS
2.0000 | INHALATION_SPRAY | Freq: Four times a day (QID) | RESPIRATORY_TRACT | 5 refills | Status: DC | PRN
  Filled 2022-02-12: qty 6.7, 25d supply, fill #0
  Filled 2022-08-12: qty 6.7, 25d supply, fill #1
  Filled 2022-09-05: qty 6.7, 25d supply, fill #2
  Filled 2022-12-29: qty 6.7, 25d supply, fill #3

## 2022-02-12 NOTE — Patient Outreach (Signed)
Care Coordination   Follow Up Visit Note   02/12/2022 Name: [name] MRN: [id number] DOB: 1950/07/30  [name] is a 72 y.o. year old female who sees [name], [name], MD for primary care. I spoke with  [name] by phone today.  What matters to the patients health and wellness today?  Health and mental health wellness    Goals Addressed             This Visit's Progress    Provide support, resources and mental health support       Care Coordination Interventions:  CSW spoke with pt who reports  after 2 ER visits for "vertigo", it is somewhat better and she is taking less pills.  Pt also reports she has not heard from [facility]- assisted pt with outreach call to [facility] and they indicated they have not received it yet. CSW will fax copy sent by PCP.   Previous conversation with pt about her sadness/depression.  Support offered and discussed ways to enhance her life by on-going counseling support, getting back involved in the community with [facility], etc and to seek opportunities for her to be more active in community.   Pt does report. She continues to feel better.  Previous conversation with pt about "sadness". Pt reported she has "lots to talk about" but nothing specific today. "I can be stubborn". . Pt lives alone, does not drive and has a sister, [name], who helps her with rides, getting groceries, RX, etc.  She also has a neighbor who is helpful with getting mail, etc.  She also reports receiving Meals on Wheels and what sounds like partial Medicaid. CSW reminded pt of her HTA benefits- reviewed several with her. Pt acknowledges having depression; PHQ9 depression screening completed today- pt scoring "14" (improved from "17").  Pt denies SI/HI. She is open to considering mental health counseling and is aware Kathryn Height, LCSW-A, will be providing this in PCP's office later this month.  Pt has received resources mailed to her on life alert, [place] activities- encouraged pt to review and consider opportunities to enhance pt's overall quality of life/depression.   Pt is likely eligible for Medicaid based on income disclosed- CSW will email her the online EPASS website to apply online if she wishes; or can go to local DSS office- stressed to pt lots of helpful benefits with Medicaid if she applies and is approved.  Pt shared her sister wants her to move to ALF; "But I'm not ready".      01/15/2022    1:50 PM 11/07/2021   11:44 AM 09/19/2021   11:55 AM 08/22/2021   11:16 AM 08/01/2021   11:04 AM  Depression screen PHQ 2/9  Decreased Interest 3 0 3 0 0  Down, Depressed, Hopeless 3 0 3 0 0  PHQ - 2 Score 6 0 6 0 0  Altered sleeping 3  3    Tired, decreased energy 3  3    Change in appetite 0  3    Feeling bad or failure about yourself  0  1    Trouble concentrating 2  1    Moving slowly or fidgety/restless 0  0    Suicidal thoughts 0  0    PHQ-9 Score 14  17    Difficult doing work/chores Somewhat difficult  Very difficult      Reviewed Care Coordination Services:  Assessed Social Determinants of Health Reviewed all upcoming appointments in Epic system  Assisted pt with call to [employer] re: application Emailed pt a link for online application  for Medicaid Motivational Interviewing employed Depression screen reviewed  PHQ2/ PHQ9 completed Solution-Focused Strategies employed:  Active listening / Reflection utilized  Emotional Support Provided Problem [place] strategies reviewed Provided general psycho-education for mental health needs  Reviewed mental health medications and discussed importance of compliance:            SDOH assessments and interventions completed:  Yes     Care Coordination Interventions:  Yes, provided   Follow up plan: Follow up call scheduled for 03/04/22    Encounter Outcome:  Pt. Visit Completed

## 2022-02-12 NOTE — Patient Instructions (Signed)
Visit Information  Thank you for taking time to visit with me today. Please don't hesitate to contact me if I can be of assistance to you.   Following are the goals we discussed today:   Goals Addressed             This Visit's Progress    Provide support, resources and mental health support       Care Coordination Interventions:  CSW spoke with pt who reports  after 2 ER visits for "vertigo", it is somewhat better and she is taking less pills.  Pt also reports she has not heard from [facility]- assisted pt with outreach call to [facility] and they indicated they have not received it yet. CSW will fax copy sent by PCP.   Previous conversation with pt about her sadness/depression.  Support offered and discussed ways to enhance her life by on-going counseling support, getting back involved in the community with [facility], etc and to seek opportunities for her to be more active in community.   Pt does report. She continues to feel better.  Previous conversation with pt about "sadness". Pt reported she has "lots to talk about" but nothing specific today. "I can be stubborn". . Pt lives alone, does not drive and has a sister, [name], who helps her with rides, getting groceries, RX, etc.  She also has a neighbor who is helpful with getting mail, etc.  She also reports receiving Meals on Wheels and what sounds like partial Medicaid. CSW reminded pt of her HTA benefits- reviewed several with her. Pt acknowledges having depression; PHQ9 depression screening completed today- pt scoring "14" (improved from "17").  Pt denies SI/HI. She is open to considering mental health counseling and is aware Milus Height, LCSW-A, will be providing this in PCP's office later this month.  Pt has received resources mailed to her on life alert, [place] activities- encouraged pt to review and consider opportunities to enhance pt's overall quality of life/depression.   Pt is likely eligible for Medicaid based on income  disclosed- CSW will email her the online EPASS website to apply online if she wishes; or can go to local DSS office- stressed to pt lots of helpful benefits with Medicaid if she applies and is approved.  Pt shared her sister wants her to move to ALF; "But I'm not ready".      01/15/2022    1:50 PM 11/07/2021   11:44 AM 09/19/2021   11:55 AM 08/22/2021   11:16 AM 08/01/2021   11:04 AM  Depression screen PHQ 2/9  Decreased Interest 3 0 3 0 0  Down, Depressed, Hopeless 3 0 3 0 0  PHQ - 2 Score 6 0 6 0 0  Altered sleeping 3  3    Tired, decreased energy 3  3    Change in appetite 0  3    Feeling bad or failure about yourself  0  1    Trouble concentrating 2  1    Moving slowly or fidgety/restless 0  0    Suicidal thoughts 0  0    PHQ-9 Score 14  17    Difficult doing work/chores Somewhat difficult  Very difficult      Reviewed Care Coordination Services:  Assessed Social Determinants of Health Reviewed all upcoming appointments in Epic system Assisted pt with call to [employer] re: application Emailed pt a link for online application  for Medicaid Motivational Interviewing employed Depression screen reviewed  PHQ2/ PHQ9 completed Solution-Focused Strategies  employed:  Active listening / Reflection utilized  Emotional Support Provided Problem [place] strategies reviewed Provided general psycho-education for mental health needs  Reviewed mental health medications and discussed importance of compliance:            Our next appointment is by telephone on 03/04/22   Please call the care guide team at [phone] if you need to cancel or reschedule your appointment.   If you are experiencing a Mental Health or [place] or need someone to talk to, please call the Suicide and Crisis Lifeline: 988 call 911   The patient verbalized understanding of instructions, educational materials, and care plan provided today and DECLINED offer to receive copy of patient  instructions, educational materials, and care plan.   Telephone follow up appointment with care management team member scheduled for:03/04/22  Eduard Clos MSW, LCSW Licensed Clinical Social Worker    [phone]

## 2022-02-12 NOTE — Progress Notes (Unsigned)
One fall, slippery pjs slid off the arm of sofa, struck her nose.   RUQ pain, improving, at one point pain with breathing.   Dizzy?  Unteady, feels as though she may lose balance but doesn't feel as though she would faint.  Unable to recall her symptoms or describe them meaningfully.  Seems off.    No desire to do anything.  No energy, sometimes doesn't even eat.  Eats candy to keep blood sugar up.    BP (!) 133/46 (BP Location: Right Arm, Patient Position: Sitting, Cuff Size: Small)   Pulse 69   Temp 97.9 F (36.6 C) (Oral)   Ht _0  (1.575 m)   Wt 158 lb 9.6 oz (71.9 kg)   SpO2 95%   BMI 29.01 kg/m  Tearful but stoic affect.  Examined in the transport chair as she is unable to get onto the exam table.  Right upper quadrant tender with deep palpation, positive Murphy sign.  Abdomen is soft.  No right flank pain.  No icterus or jaundice.  Skin turgor is reduced.  Assessment and Plan: (problems below not in order of priority)  Memory difficulties Plan today was to more formally assess her memory with MoCA, though we agree that today is probably not the best day to screen, as she is not feeling her usual self.  I shared with her that I have also noticed that her memory has been declining.  While she has for a long time experienced long-term memory difficulties, more recently she has been troubled by the short-term impairment.  This is particularly concerning given the potential for harm in relation to her high risk medication regimen.  We reviewed recent brain MRI which showed small vessel disease (a long-term consequence of cardiovascular disease factors and smoking, of which she has all) and that brain atrophy is visible.  If she were to have clinical dementia (she does not), we would discuss cholinesterase inhibitor, which I advised her would be unlikely to improve her memory but rather to delay it is worsening.  The other most important intervention is ensuring safety.  We will discuss again  next week.  Abdominal pain Intermittent pain continues, at times quite significant.  Feels queasy but has not had vomiting.  Not eating much, no appetite.  Abdomen is not distended.  No guarding but does have a Murphy sign.  No right flank pain.  Abdominal CT in recent months shows normal gallbladder and no mention of stones.  Recent CMP with no elevation of hepatobiliary enzymes.  We will proceed with RUQ [place].  Multifactorial functional impairment Long and difficult discussion today with her sister [name] present to share concerns about her safety and need for additional assistance.  She is truly not safe to be at home, given her high fall risk which is multifactorial (she is barely getting up anymore, not getting food or drink on a regular basis) and her high risk polypharmacy which can lead to sedation and declining in cognition, and with ongoing smoking and sometimes nightly oxygen use posing significant fire hazard. She is tearful as we discuss a care facility.  It is becoming more difficult to manage her multimorbidity safely without more consistent monitoring (though we do have monthly visits most of the time).  Notably, [name] did better when in the hospital recently so I think she has the potential to improve.  We agreed that short term rehab would be best next step (we've already tried HHPT; she has worsened since  then) during which she could apply for Medicaid and sort out next step.  I have contacted [name], MSW, who knows [name] well.  [place] is preferred.  Order placed for home PT/OT assessment for level of care.  [name] and [name] will return next week to continue this conversation.  [name] leaves next weekend for two weeks and we will need to have a good plan in place.

## 2022-02-13 ENCOUNTER — Other Ambulatory Visit (HOSPITAL_COMMUNITY): Payer: Self-pay

## 2022-02-13 ENCOUNTER — Ambulatory Visit (INDEPENDENT_AMBULATORY_CARE_PROVIDER_SITE_OTHER): Payer: HMO | Admitting: Internal Medicine

## 2022-02-13 ENCOUNTER — Telehealth: Payer: Self-pay | Admitting: Pharmacist

## 2022-02-13 ENCOUNTER — Other Ambulatory Visit: Payer: Self-pay

## 2022-02-13 ENCOUNTER — Encounter: Payer: Self-pay | Admitting: Internal Medicine

## 2022-02-13 VITALS — BP 133/46 | HR 69 | Temp 97.9°F | Ht 62.0 in | Wt 158.6 lb

## 2022-02-13 DIAGNOSIS — R1011 Right upper quadrant pain: Secondary | ICD-10-CM

## 2022-02-13 DIAGNOSIS — I480 Paroxysmal atrial fibrillation: Secondary | ICD-10-CM | POA: Diagnosis not present

## 2022-02-13 DIAGNOSIS — R413 Other amnesia: Secondary | ICD-10-CM

## 2022-02-13 DIAGNOSIS — R42 Dizziness and giddiness: Secondary | ICD-10-CM

## 2022-02-13 DIAGNOSIS — R5381 Other malaise: Secondary | ICD-10-CM | POA: Insufficient documentation

## 2022-02-13 LAB — POCT INR: INR: 1.4 — AB (ref 2.0–3.0)

## 2022-02-13 MED ORDER — MECLIZINE HCL 25 MG PO TABS
25.0000 mg | ORAL_TABLET | Freq: Two times a day (BID) | ORAL | 5 refills | Status: DC | PRN
  Filled 2022-02-13: qty 30, 15d supply, fill #0

## 2022-02-13 NOTE — Assessment & Plan Note (Signed)
Intermittent pain continues, at times quite significant.  Feels queasy but has not had vomiting.  Not eating much, no appetite.  Abdomen is not distended.  No guarding but does have a Murphy sign.  No right flank pain.  Abdominal CT in recent months shows normal gallbladder and no mention of stones.  Recent CMP with no elevation of hepatobiliary enzymes.  We will proceed with RUQ [place].

## 2022-02-13 NOTE — Assessment & Plan Note (Signed)
Long and difficult discussion today with her sister [name] present to share concerns about her safety and need for additional assistance.  She is truly not safe to be at home, given her high fall risk which is multifactorial (she is barely getting up anymore, not getting food or drink on a regular basis) and her high risk polypharmacy which can lead to sedation and declining in cognition, and with ongoing smoking and sometimes nightly oxygen use posing significant fire hazard. She is tearful as we discuss a care facility.  It is becoming more difficult to manage her multimorbidity safely without more consistent monitoring (though we do have monthly visits most of the time).  Notably, Ms. Gorby did better when in the hospital recently so I think she has the potential to improve.  We agreed that short term rehab would be best next step (we've already tried HHPT; she has worsened since then) during which she could apply for Medicaid and sort out next step.  I have contacted [name], MSW, who knows Ms. Bossi well.  [place] is preferred.  Order placed for home PT/OT assessment for level of care.  [name] and [name] will return next week to continue this conversation.  [name] leaves next weekend for two weeks and we will need to have a good plan in place.

## 2022-02-13 NOTE — Assessment & Plan Note (Signed)
Plan today was to more formally assess her memory with MoCA, though we agree that today is probably not the best day to screen, as she is not feeling her usual self.  I shared with her that I have also noticed that her memory has been declining.  While she has for a long time experienced long-term memory difficulties, more recently she has been troubled by the short-term impairment.  This is particularly concerning given the potential for harm in relation to her high risk medication regimen.  We reviewed recent brain MRI which showed small vessel disease (a long-term consequence of cardiovascular disease factors and smoking, of which she has all) and that brain atrophy is visible.  If she were to have clinical dementia (she does not), we would discuss cholinesterase inhibitor, which I advised her would be unlikely to improve her memory but rather to delay it is worsening.  The other most important intervention is ensuring safety.  We will discuss again next week.

## 2022-02-13 NOTE — Telephone Encounter (Signed)
INR from clinic visit with Physician was provided to me by Olivia Mackie in our lab. INR = 1.4. See dosing documentation from phone notes for dosing that gave this response. NO missed doses. Patient was instructed to take 2.5 milligrams today, Friday, Saturday and Sunday and repeat patient self testing on Monday.

## 2022-02-14 ENCOUNTER — Telehealth: Payer: Self-pay | Admitting: *Deleted

## 2022-02-14 NOTE — Patient Outreach (Signed)
  Care Coordination   Follow Up Visit Note   02/14/2022 Name: [name] MRN: [id number] DOB: 1950/04/14  [name] is a 72 y.o. year old female who sees [name], Kathryn Pattee, MD for primary care. I spoke with  [name] by phone today.  What matters to the patients health and wellness today?  Safety, health, care and support.    Goals Addressed             This Visit's Progress    Provide support, resources and mental health support       Care Coordination Interventions:  CSW  received phone call from pt today. Pt shared that her sister is leaving town and she wants to wait on looking at rehab/SNF until she returns.  CSW  advised pt that getting placement for rehab will take some time AND she may not be eligible for it if there is no skilled need (PT).  Awaiting HH orders for PT to be processed through PCP and [facility] agency- advised pt to expect phone call from a [facility] agency.  CSW submitted [employer] application again since they reported it had not been received. Pt will receive phone call from them for assessment/determination.  CSW will follow up next week for [facility] agency assigned, etc       01/15/2022    1:50 PM 11/07/2021   11:44 AM 09/19/2021   11:55 AM 08/22/2021   11:16 AM 08/01/2021   11:04 AM  Depression screen PHQ 2/9  Decreased Interest 3 0 3 0 0  Down, Depressed, Hopeless 3 0 3 0 0  PHQ - 2 Score 6 0 6 0 0  Altered sleeping 3  3    Tired, decreased energy 3  3    Change in appetite 0  3    Feeling bad or failure about yourself  0  1    Trouble concentrating 2  1    Moving slowly or fidgety/restless 0  0    Suicidal thoughts 0  0    PHQ-9 Score 14  17    Difficult doing work/chores Somewhat difficult  Very difficult      Reviewed Care Coordination Services:  Assessed Social Determinants of Health Reviewed all upcoming appointments in Epic system Assisted pt with call to [employer] re: application Emailed pt a link for online  application  for Medicaid Motivational Interviewing employed Depression screen reviewed  PHQ2/ PHQ9 completed Solution-Focused Strategies employed:  Active listening / Reflection utilized  Emotional Support Provided Problem [place] strategies reviewed Provided general psycho-education for mental health needs  Reviewed mental health medications and discussed importance of compliance:            SDOH assessments and interventions completed:  Yes     Care Coordination Interventions:  Yes, provided   Follow up plan: Follow up call scheduled for 02/18/22    Encounter Outcome:  Pt. Visit Completed

## 2022-02-14 NOTE — Patient Instructions (Signed)
Visit Information  Thank you for taking time to visit with me today. Please don't hesitate to contact me if I can be of assistance to you.   Following are the goals we discussed today:   Goals Addressed             This Visit's Progress    Provide support, resources and mental health support       Care Coordination Interventions:  CSW  received phone call from pt today. Pt shared that her sister is leaving town and she wants to wait on looking at rehab/SNF until she returns.  CSW  advised pt that getting placement for rehab will take some time AND she may not be eligible for it if there is no skilled need (PT).  Awaiting HH orders for PT to be processed through PCP and [facility] agency- advised pt to expect phone call from a [facility] agency.  CSW submitted [employer] application again since they reported it had not been received. Pt will receive phone call from them for assessment/determination.  CSW will follow up next week for [facility] agency assigned, etc       01/15/2022    1:50 PM 11/07/2021   11:44 AM 09/19/2021   11:55 AM 08/22/2021   11:16 AM 08/01/2021   11:04 AM  Depression screen PHQ 2/9  Decreased Interest 3 0 3 0 0  Down, Depressed, Hopeless 3 0 3 0 0  PHQ - 2 Score 6 0 6 0 0  Altered sleeping 3  3    Tired, decreased energy 3  3    Change in appetite 0  3    Feeling bad or failure about yourself  0  1    Trouble concentrating 2  1    Moving slowly or fidgety/restless 0  0    Suicidal thoughts 0  0    PHQ-9 Score 14  17    Difficult doing work/chores Somewhat difficult  Very difficult      Reviewed Care Coordination Services:  Assessed Social Determinants of Health Reviewed all upcoming appointments in Epic system Assisted pt with call to [employer] re: application Emailed pt a link for online application  for Medicaid Motivational Interviewing employed Depression screen reviewed  PHQ2/ PHQ9 completed Solution-Focused Strategies employed:  Active listening / Reflection utilized   Emotional Support Provided Problem [place] strategies reviewed Provided general psycho-education for mental health needs  Reviewed mental health medications and discussed importance of compliance:            Our next appointment is by telephone on 02/18/22  Please call the care guide team at [phone] if you need to cancel or reschedule your appointment.   If you are experiencing a Mental Health or [place] or need someone to talk to, please call the Suicide and Crisis Lifeline: 988 call the [place] National Suicide Prevention Lifeline: (438)004-9526 or TTY: (914) 592-6659 TTY 684-080-4640) to talk to a trained counselor call 911   Patient verbalizes understanding of instructions and care plan provided today and agrees to view in [place]. Active MyChart status and patient understanding of how to access instructions and care plan via MyChart confirmed with patient.     Telephone follow up appointment with care management team member scheduled for:02/18/22  Eduard Clos MSW, LCSW Licensed Clinical Social Worker    [phone]

## 2022-02-15 IMAGING — NM NM SCAN TUMOR LOCALIZE WITH SPECT
2 series · 7 of 7 positions shown · non-contrast
Comparison: none

CLINICAL DATA: HEART FAILURE. CONCERN FOR CARDIAC AMYLOIDOSIS.

EXAM:
NUCLEAR MEDICINE TUMOR LOCALIZATION. PYP CARDIAC AMYLOIDOSIS SCAN
WITH SPECT
TECHNIQUE: Following intravenous administration of radiopharmaceutical,
anterior planar images of the chest were obtained. Regions of
interest were placed on the heart and contralateral chest wall for
quantitative assessment. Additional SPECT imaging of the chest was
obtained.
RADIOPHARMACEUTICALS:  21.9 mCi TECHNETIUM 99 PYROPHOSPHATE

[Series 1: anterior · 4.14mm/px · 1 of 1 slices shown]
[im 1/1]
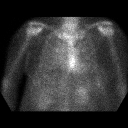

[Series 2: amyloid spect · 4.14mm/px · 6 of 64 frames shown]
[frame 6/64]
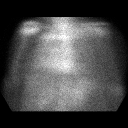
[frame 16/64]
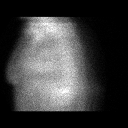
[frame 27/64]
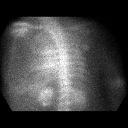
[frame 38/64]
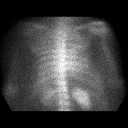
[frame 48/64]
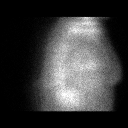
[frame 59/64]
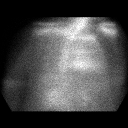

[7 of 7 positions shown; findings below may reference images not displayed]

FINDINGS: Planar Visual assessment:

Anterior planar imaging demonstrates radiotracer uptake within the
heart greater than uptake within the adjacent ribs (Grade 2).

Quantitative assessment :

Quantitative assessment of the cardiac uptake compared to the
contralateral chest wall is equal to

SPECT assessment: SPECT imaging of the chest demonstrates clear
radiotracer accumulation within the LEFT ventricle.
IMPRESSION: Visual and quantitative assessment (grade 2, H/CLL equal 1.1) are
strongly suggestive of transthyretin amyloidosis.

## 2022-02-17 ENCOUNTER — Telehealth: Payer: Self-pay | Admitting: Pharmacist

## 2022-02-17 ENCOUNTER — Encounter: Payer: Self-pay | Admitting: *Deleted

## 2022-02-17 NOTE — Telephone Encounter (Signed)
Patient provides results of PST FS POC INR for today = 2.70. Will continue regimen of: 2.21m (1 tablet) on MWF; 1/2 x 2.565m(1.2557mose) all other days. Repeat PST on 15-JAN-24 and report value to me. No bleeding or symptoms of embolic disease cited by the patient.

## 2022-02-18 ENCOUNTER — Ambulatory Visit: Payer: Self-pay | Admitting: *Deleted

## 2022-02-18 NOTE — Patient Outreach (Signed)
  Care Coordination   Follow Up Visit Note   02/18/2022 Name: [name] MRN: [id number] DOB: [birth year]  [name] is a 72 y.o. year old female who sees [name], [name], MD for primary care. I spoke with  [name] by phone today.  What matters to the patients health and wellness today?  Wants to hold off until sister returns for further PCP or SNF planning.    Goals Addressed             This Visit's Progress    Provide support, resources and mental health support       Care Coordination Interventions:  CSW spoke with pt today by phone- reminded her that [facility] services should be starting and to expect call from them.  CSW inquiring with PCP office to determine what [facility] agency has the referral for outreach/follow up with them about needs.  Pt remains stating she wants to wait until her sister returns (late January) to see PCP in office as well in regards to possible rehab placement.   Pt previously shared that her sister is leaving town and she wants to wait on looking at rehab/SNF until she returns.  CSW  advised pt that getting placement for rehab will take some time AND she may not be eligible for it if there is no skilled need (PT).  Awaiting HHPT to begin.  CSW submitted [employer] application again since they reported it had not been received. Pt will receive phone call from them for assessment/determination.  CSW will follow up next week for [facility] agency assigned, etc       01/15/2022    1:50 PM 11/07/2021   11:44 AM 09/19/2021   11:55 AM 08/22/2021   11:16 AM 08/01/2021   11:04 AM  Depression screen PHQ 2/9  Decreased Interest 3 0 3 0 0  Down, Depressed, Hopeless 3 0 3 0 0  PHQ - 2 Score 6 0 6 0 0  Altered sleeping 3  3    Tired, decreased energy 3  3    Change in appetite 0  3    Feeling bad or failure about yourself  0  1    Trouble concentrating 2  1    Moving slowly or fidgety/restless 0  0    Suicidal thoughts 0  0    PHQ-9  Score 14  17    Difficult doing work/chores Somewhat difficult  Very difficult      Reviewed Care Coordination Services:  Assessed Social Determinants of Health Reviewed all upcoming appointments in Epic system Assisted pt with call to [employer] re: application Emailed pt a link for online application  for Medicaid Motivational Interviewing employed Depression screen reviewed  PHQ2/ PHQ9 completed Solution-Focused Strategies employed:  Active listening / Reflection utilized  Emotional Support Provided Problem [place] strategies reviewed Provided general psycho-education for mental health needs  Reviewed mental health medications and discussed importance of compliance:            SDOH assessments and interventions completed:  Yes     Care Coordination Interventions:  Yes, provided   Follow up plan: Follow up call scheduled for 02/25/23    Encounter Outcome:  Pt. Visit Completed

## 2022-02-18 NOTE — Patient Instructions (Signed)
Visit Information  Thank you for taking time to visit with me today. Please don't hesitate to contact me if I can be of assistance to you.   Following are the goals we discussed today:   Goals Addressed             This Visit's Progress    Provide support, resources and mental health support       Care Coordination Interventions:  CSW spoke with [name] today by phone- reminded her that [facility] services should be starting and to expect call from them.  CSW inquiring with PCP office to determine what [facility] agency has the referral for outreach/follow up with them about needs.  [name] remains stating she wants to wait until her sister returns (late January) to see PCP in office as well in regards to possible rehab placement.   [name] previously shared that her sister is leaving town and she wants to wait on looking at rehab/SNF until she returns.  CSW  advised [name] that getting placement for rehab will take some time AND she may not be eligible for it if there is no skilled need ([name]).  Awaiting HHPT to begin.  CSW submitted [employer]AT application again since they reported it had not been received. [name] will receive phone call from them for assessment/determination.  CSW will follow up next week for [facility] agency assigned, etc       01/15/2022    1:50 PM 11/07/2021   11:44 AM 09/19/2021   11:55 AM 08/22/2021   11:16 AM 08/01/2021   11:04 AM  Depression screen PHQ 2/9  Decreased Interest 3 0 3 0 0  Down, Depressed, Hopeless 3 0 3 0 0  PHQ - 2 Score 6 0 6 0 0  Altered sleeping 3  3    Tired, decreased energy 3  3    Change in appetite 0  3    Feeling bad or failure about yourself  0  1    Trouble concentrating 2  1    Moving slowly or fidgety/restless 0  0    Suicidal thoughts 0  0    PHQ-9 Score 14  17    Difficult doing work/chores Somewhat difficult  Very difficult      Reviewed Care Coordination Services:  Assessed Social Determinants of Health Reviewed all upcoming appointments in Epic system Assisted [name]  with call to [employer] re: application Emailed [name] a link for online application  for Medicaid Motivational Interviewing employed Depression screen reviewed  PHQ2/ PHQ9 completed Solution-Focused Strategies employed:  Active listening / Reflection utilized  Emotional Support Provided Problem [place] strategies reviewed Provided general psycho-education for mental health needs  Reviewed mental health medications and discussed importance of compliance:            Our next appointment is by telephone on 02/24/22 at 9am  Please call the care guide team at [phone] if you need to cancel or reschedule your appointment.   If you are experiencing a Mental Health or [place] or need someone to talk to, please call the Suicide and Crisis Lifeline: 988 call the [place] National Suicide Prevention Lifeline: (347)265-0168 or TTY: 571-013-6713 TTY 229-866-6995) to talk to a trained counselor call 911   Patient verbalizes understanding of instructions and care plan provided today and agrees to view in [place]. Active MyChart status and patient understanding of how to access instructions and care plan via MyChart confirmed with patient.     Telephone follow up  appointment with care management team member scheduled for:02/24/22  Eduard Clos MSW, LCSW Licensed Clinical Social Worker    [phone]

## 2022-02-21 NOTE — Progress Notes (Signed)
Internal Medicine Clinic Attending   I saw and evaluated the patient.  I personally confirmed the key portions of the history and exam documented by Dr. Dema Severin and I reviewed pertinent patient test results.  The assessment, diagnosis, and plan were formulated together and I agree with the documentation in the resident's note.

## 2022-02-24 ENCOUNTER — Ambulatory Visit (HOSPITAL_COMMUNITY): Payer: PPO

## 2022-02-24 ENCOUNTER — Ambulatory Visit: Payer: Self-pay | Admitting: *Deleted

## 2022-02-24 ENCOUNTER — Encounter (HOSPITAL_COMMUNITY): Payer: Self-pay

## 2022-02-24 ENCOUNTER — Telehealth: Payer: Self-pay | Admitting: Pharmacist

## 2022-02-24 DIAGNOSIS — I480 Paroxysmal atrial fibrillation: Secondary | ICD-10-CM | POA: Diagnosis not present

## 2022-02-24 DIAGNOSIS — Z7901 Long term (current) use of anticoagulants: Secondary | ICD-10-CM | POA: Diagnosis not present

## 2022-02-24 NOTE — Telephone Encounter (Signed)
Patient provides result of PST FS POC INR = 2.0 (target range 2.0 - 2.5) on 12.31m warfarin per week. CONTINUE same regimen: 1x2.538mMWF; 1/2 x 2.19m26m1.219m83mLL other days. REPEAT INR 22-JAN-24. No bleeding per her assesssment.

## 2022-02-24 NOTE — Patient Instructions (Signed)
Visit Information  Thank you for taking time to visit with me today. Please don't hesitate to contact me if I can be of assistance to you.   Following are the goals we discussed today:   Goals Addressed             This Visit's Progress    Provide support, resources and mental health support       Care Coordination Interventions:  CSW spoke with pt today by phone- she wants to change her Oxygen provider to [place] instead of current DME company. CSW will alert PCP of this and also inquire about [facility] agency that has accepted orders for [facility] initiated recently.  Pt reports she "slid off the chair this morning"- reminded her that [facility] services should be starting soon and hopefully PT will help her with strengthening, etc- alerted her to expect call from them.  Pt continues to state she wants to wait until her sister returns (late January) to see PCP in office as well in regards to possible rehab placement.         01/15/2022    1:50 PM 11/07/2021   11:44 AM 09/19/2021   11:55 AM 08/22/2021   11:16 AM 08/01/2021   11:04 AM  Depression screen PHQ 2/9  Decreased Interest 3 0 3 0 0  Down, Depressed, Hopeless 3 0 3 0 0  PHQ - 2 Score 6 0 6 0 0  Altered sleeping 3  3    Tired, decreased energy 3  3    Change in appetite 0  3    Feeling bad or failure about yourself  0  1    Trouble concentrating 2  1    Moving slowly or fidgety/restless 0  0    Suicidal thoughts 0  0    PHQ-9 Score 14  17    Difficult doing work/chores Somewhat difficult  Very difficult      Reviewed Care Coordination Services:  Assessed Social Determinants of Health Reviewed all upcoming appointments in Epic system Assisted pt with call to [employer] re: application Emailed pt a link for online application  for Medicaid Motivational Interviewing employed Depression screen reviewed  PHQ2/ PHQ9 completed Solution-Focused Strategies employed:  Active listening / Reflection utilized  Emotional Support Provided Problem  [place] strategies reviewed Provided general psycho-education for mental health needs  Reviewed mental health medications and discussed importance of compliance:            Our next appointment is by telephone on 03/10/22  Please call the care guide team at [phone] if you need to cancel or reschedule your appointment.   If you are experiencing a Mental Health or [place] or need someone to talk to, please call the Suicide and Crisis Lifeline: 988 call the [place] National Suicide Prevention Lifeline: (308) 077-4432 or TTY: 8703106703 TTY (401)494-0561) to talk to a trained counselor call 911   Patient verbalizes understanding of instructions and care plan provided today and agrees to view in [place]. Active MyChart status and patient understanding of how to access instructions and care plan via MyChart confirmed with patient.     Telephone follow up appointment with care management team member scheduled for:03/10/22  Eduard Clos, MSW, [place] Worker [employer] [phone]

## 2022-02-24 NOTE — Patient Outreach (Signed)
  Care Coordination   Follow Up Visit Note   02/24/2022 Name: [name] MRN: [id number] DOB: Sep 14, 1950  [name] is a 72 y.o. year old female who sees [name], Kathryn Pattee, MD for primary care. I spoke with  [name] by phone today.  What matters to the patients health and wellness today?  "Want to change my oxygen to another company"    Goals Addressed             This Visit's Progress    Provide support, resources and mental health support       Care Coordination Interventions:  CSW spoke with pt today by phone- she wants to change her Oxygen provider to [place] instead of current DME company. CSW will alert PCP of this and also inquire about [facility] agency that has accepted orders for [facility] initiated recently.  Pt reports she "slid off the chair this morning"- reminded her that [facility] services should be starting soon and hopefully PT will help her with strengthening, etc- alerted her to expect call from them.  Pt continues to state she wants to wait until her sister returns (late January) to see PCP in office as well in regards to possible rehab placement.         01/15/2022    1:50 PM 11/07/2021   11:44 AM 09/19/2021   11:55 AM 08/22/2021   11:16 AM 08/01/2021   11:04 AM  Depression screen PHQ 2/9  Decreased Interest 3 0 3 0 0  Down, Depressed, Hopeless 3 0 3 0 0  PHQ - 2 Score 6 0 6 0 0  Altered sleeping 3  3    Tired, decreased energy 3  3    Change in appetite 0  3    Feeling bad or failure about yourself  0  1    Trouble concentrating 2  1    Moving slowly or fidgety/restless 0  0    Suicidal thoughts 0  0    PHQ-9 Score 14  17    Difficult doing work/chores Somewhat difficult  Very difficult      Reviewed Care Coordination Services:  Assessed Social Determinants of Health Reviewed all upcoming appointments in Epic system Assisted pt with call to [employer] re: application Emailed pt a link for online application  for  Medicaid Motivational Interviewing employed Depression screen reviewed  PHQ2/ PHQ9 completed Solution-Focused Strategies employed:  Active listening / Reflection utilized  Emotional Support Provided Problem [place] strategies reviewed Provided general psycho-education for mental health needs  Reviewed mental health medications and discussed importance of compliance:            SDOH assessments and interventions completed:  Yes     Care Coordination Interventions:  Yes, provided   Follow up plan: Follow up call scheduled for 03/10/22- will call prior to this as well with any updates on [facility], etc    Encounter Outcome:  Pt. Visit Completed

## 2022-02-27 ENCOUNTER — Other Ambulatory Visit: Payer: Self-pay

## 2022-02-27 ENCOUNTER — Telehealth: Payer: Self-pay

## 2022-02-27 ENCOUNTER — Other Ambulatory Visit (HOSPITAL_COMMUNITY): Payer: Self-pay

## 2022-02-27 DIAGNOSIS — R296 Repeated falls: Secondary | ICD-10-CM | POA: Diagnosis not present

## 2022-02-27 DIAGNOSIS — G3184 Mild cognitive impairment, so stated: Secondary | ICD-10-CM | POA: Diagnosis not present

## 2022-02-27 DIAGNOSIS — Z7985 Long-term (current) use of injectable non-insulin antidiabetic drugs: Secondary | ICD-10-CM | POA: Diagnosis not present

## 2022-02-27 DIAGNOSIS — I739 Peripheral vascular disease, unspecified: Secondary | ICD-10-CM | POA: Diagnosis not present

## 2022-02-27 DIAGNOSIS — Z7901 Long term (current) use of anticoagulants: Secondary | ICD-10-CM | POA: Diagnosis not present

## 2022-02-27 DIAGNOSIS — Z9989 Dependence on other enabling machines and devices: Secondary | ICD-10-CM | POA: Diagnosis not present

## 2022-02-27 DIAGNOSIS — D649 Anemia, unspecified: Secondary | ICD-10-CM | POA: Diagnosis not present

## 2022-02-27 DIAGNOSIS — E1140 Type 2 diabetes mellitus with diabetic neuropathy, unspecified: Secondary | ICD-10-CM | POA: Diagnosis not present

## 2022-02-27 DIAGNOSIS — Z794 Long term (current) use of insulin: Secondary | ICD-10-CM | POA: Diagnosis not present

## 2022-02-27 DIAGNOSIS — I480 Paroxysmal atrial fibrillation: Secondary | ICD-10-CM | POA: Diagnosis not present

## 2022-02-27 NOTE — Telephone Encounter (Signed)
Returned call to [place]. She will add a Skilled Nursing Eval for med management, and to assess for skilled nursing needs for potential SNF placement.

## 2022-02-27 NOTE — Telephone Encounter (Signed)
[name] with [facility] requesting VO for PT. Please call back.

## 2022-02-27 NOTE — Telephone Encounter (Signed)
Returned call to [place], PT with [facility]. Requesting VO for  HH PT 2 week 3, and 1 week 2 to work on strengthening, balance, and gait training. Verbal auth given. Will route to PCP for agreement/denial.

## 2022-03-03 ENCOUNTER — Other Ambulatory Visit (HOSPITAL_COMMUNITY): Payer: Self-pay

## 2022-03-03 ENCOUNTER — Encounter: Payer: Self-pay | Admitting: Pharmacist

## 2022-03-03 ENCOUNTER — Telehealth: Payer: Self-pay | Admitting: Pharmacist

## 2022-03-03 ENCOUNTER — Ambulatory Visit: Payer: HMO | Admitting: Podiatry

## 2022-03-03 DIAGNOSIS — G4733 Obstructive sleep apnea (adult) (pediatric): Secondary | ICD-10-CM | POA: Diagnosis not present

## 2022-03-03 NOTE — Telephone Encounter (Signed)
Patient provides result of PST FS POC INR test just now performed = 2.2. No bleeding. Will continue same regimen 1 tablet MWF; 1/2 tablet all other days. Repeat PST FS POC INR 29-JAN-24.

## 2022-03-04 ENCOUNTER — Ambulatory Visit: Payer: Self-pay | Admitting: *Deleted

## 2022-03-04 NOTE — Patient Instructions (Signed)
Visit Information  Thank you for taking time to visit with me today. Please don't hesitate to contact me if I can be of assistance to you.   Following are the goals we discussed today:   Goals Addressed             This Visit's Progress    Provide support, resources and mental health support       Care Coordination Interventions:  CSW spoke with pt today by phone- she still wants to change her Oxygen provider to [place] instead of current DME company- unsure if this has transpired- suggested pt ask at next PCP visit which she is planning for after her sister returns.  Per chart notes, [facility] is the [facility] provider who is seeing pt- she will be getting PT and RN visits- she reports no falls in the past few days.  Pt also indicates the [facility] has suggested an OTC mixture to help with her constipation.    Pt continues to state she wants to wait until her sister returns (late January) to see PCP in office as well in regards to possible rehab placement.  Pt wants to try to get back into the local Y programs and "try to go twice a week if my sister will take me". CSW suggested she might also be able to take SCAT- CSW awaits callback from [employer] to check on status of  her SCAT application.  Pt also may enjoy getting back involved with the [facility] where she use to go and swim, etc.   Will also await PT recommendations for ?SNF/level of care-     01/15/2022    1:50 PM 11/07/2021   11:44 AM 09/19/2021   11:55 AM 08/22/2021   11:16 AM 08/01/2021   11:04 AM  Depression screen PHQ 2/9  Decreased Interest 3 0 3 0 0  Down, Depressed, Hopeless 3 0 3 0 0  PHQ - 2 Score 6 0 6 0 0  Altered sleeping 3  3    Tired, decreased energy 3  3    Change in appetite 0  3    Feeling bad or failure about yourself  0  1    Trouble concentrating 2  1    Moving slowly or fidgety/restless 0  0    Suicidal thoughts 0  0    PHQ-9 Score 14  17    Difficult doing work/chores Somewhat difficult  Very difficult       Reviewed Care Coordination Services:  Assessed Social Determinants of Health Reviewed all upcoming appointments in Epic system Assisted pt with call to [employer] re: application Emailed pt a link for online application  for Medicaid Motivational Interviewing employed Depression screen reviewed  PHQ2/ PHQ9 completed Solution-Focused Strategies employed:  Active listening / Reflection utilized  Emotional Support Provided Problem [place] strategies reviewed Provided general psycho-education for mental health needs  Reviewed mental health medications and discussed importance of compliance:            Our next appointment is by telephone on 03/13/22  Please call the care guide team at [phone] if you need to cancel or reschedule your appointment.   If you are experiencing a Mental Health or [place] or need someone to talk to, please call the Suicide and Crisis Lifeline: 988 call 911   The patient verbalized understanding of instructions, educational materials, and care plan provided today and DECLINED offer to receive copy of patient instructions, educational materials, and care  plan.   Telephone follow up appointment with care management team member scheduled for: 03/13/22  Eduard Clos, MSW, [place] Worker [employer] [phone]

## 2022-03-04 NOTE — Patient Outreach (Signed)
  Care Coordination   Follow Up Visit Note   03/04/2022 Name: [name] MRN: [id number] DOB: 13-Jan-1951  [name] is a 72 y.o. year old female who sees [name], Kathryn Pattee, MD for primary care. I spoke with  [name] by phone today.  What matters to the patients health and wellness today?  Health/wellness.    Goals Addressed             This Visit's Progress    Provide support, resources and mental health support       Care Coordination Interventions:  CSW spoke with pt today by phone- she still wants to change her Oxygen provider to [place] instead of current DME company- unsure if this has transpired- suggested pt ask at next PCP visit which she is planning for after her sister returns.  Per chart notes, [facility] the [facility] provider who is seeing pt- she will be getting PT and RN visits- she reports no falls in the past few days.  Pt also indicates the [facility] has suggested an OTC mixture to help with her constipation.    Pt continues to state she wants to wait until her sister returns (late January) to see PCP in office as well in regards to possible rehab placement.  Pt wants to try to get back into the local Y programs and "try to go twice a week if my sister will take me". CSW suggested she might also be able to take SCAT- CSW awaits callback from [employer] to check on status of  her SCAT application.  Pt also may enjoy getting back involved with the [facility] where she use to go and swim, etc.   Will also await PT recommendations for ?SNF/level of care-     01/15/2022    1:50 PM 11/07/2021   11:44 AM 09/19/2021   11:55 AM 08/22/2021   11:16 AM 08/01/2021   11:04 AM  Depression screen PHQ 2/9  Decreased Interest 3 0 3 0 0  Down, Depressed, Hopeless 3 0 3 0 0  PHQ - 2 Score 6 0 6 0 0  Altered sleeping 3  3    Tired, decreased energy 3  3    Change in appetite 0  3    Feeling bad or failure about yourself  0  1    Trouble concentrating 2  1     Moving slowly or fidgety/restless 0  0    Suicidal thoughts 0  0    PHQ-9 Score 14  17    Difficult doing work/chores Somewhat difficult  Very difficult      Reviewed Care Coordination Services:  Assessed Social Determinants of Health Reviewed all upcoming appointments in Epic system Assisted pt with call to [employer] re: application Emailed pt a link for online application  for Medicaid Motivational Interviewing employed Depression screen reviewed  PHQ2/ PHQ9 completed Solution-Focused Strategies employed:  Active listening / Reflection utilized  Emotional Support Provided Problem [place] strategies reviewed Provided general psycho-education for mental health needs  Reviewed mental health medications and discussed importance of compliance:            SDOH assessments and interventions completed:  Yes     Care Coordination Interventions:  Yes, provided   Follow up plan: Follow up call scheduled for 03/13/22    Encounter Outcome:  Pt. Visit Completed

## 2022-03-05 ENCOUNTER — Telehealth: Payer: Self-pay

## 2022-03-05 ENCOUNTER — Other Ambulatory Visit: Payer: Self-pay

## 2022-03-05 ENCOUNTER — Ambulatory Visit: Payer: HMO | Admitting: Podiatry

## 2022-03-05 NOTE — Telephone Encounter (Signed)
[facility] RN is requesting a call back ... [place]

## 2022-03-05 NOTE — Telephone Encounter (Signed)
Return call to [place] with [facility] - no answer; left message our office returning her call.

## 2022-03-11 NOTE — Telephone Encounter (Signed)
Patient provided results of PST FS POC INR = 2.7 (Target range 2.0 - 2.5) on 12.'5mg'$  warfarin per week. Decrease to 11.'25mg'$  warfarin per week as: 1x2.'5mg'$  M/F; 1/2 x 2.'5mg'$  (1.'25mg'$  dose) all other days. Repeat PST INR 5FEB24. Denies any signs or symptoms of bleeding or embolic events. Adequate supply of warfarin and PST supplies on-hand.

## 2022-03-13 ENCOUNTER — Other Ambulatory Visit: Payer: Self-pay | Admitting: Internal Medicine

## 2022-03-13 ENCOUNTER — Other Ambulatory Visit (HOSPITAL_COMMUNITY): Payer: Self-pay

## 2022-03-13 ENCOUNTER — Other Ambulatory Visit: Payer: Self-pay

## 2022-03-13 ENCOUNTER — Ambulatory Visit: Payer: Self-pay | Admitting: *Deleted

## 2022-03-13 ENCOUNTER — Encounter: Payer: Self-pay | Admitting: *Deleted

## 2022-03-13 ENCOUNTER — Telehealth: Payer: Self-pay | Admitting: *Deleted

## 2022-03-13 ENCOUNTER — Encounter: Payer: HMO | Admitting: *Deleted

## 2022-03-13 DIAGNOSIS — E1140 Type 2 diabetes mellitus with diabetic neuropathy, unspecified: Secondary | ICD-10-CM | POA: Diagnosis not present

## 2022-03-13 DIAGNOSIS — R296 Repeated falls: Secondary | ICD-10-CM | POA: Diagnosis not present

## 2022-03-13 DIAGNOSIS — E7841 Elevated Lipoprotein(a): Secondary | ICD-10-CM

## 2022-03-13 DIAGNOSIS — Z7985 Long-term (current) use of injectable non-insulin antidiabetic drugs: Secondary | ICD-10-CM | POA: Diagnosis not present

## 2022-03-13 DIAGNOSIS — G8929 Other chronic pain: Secondary | ICD-10-CM

## 2022-03-13 DIAGNOSIS — G3184 Mild cognitive impairment, so stated: Secondary | ICD-10-CM | POA: Diagnosis not present

## 2022-03-13 DIAGNOSIS — Z9989 Dependence on other enabling machines and devices: Secondary | ICD-10-CM | POA: Diagnosis not present

## 2022-03-13 DIAGNOSIS — Z7901 Long term (current) use of anticoagulants: Secondary | ICD-10-CM | POA: Diagnosis not present

## 2022-03-13 DIAGNOSIS — D649 Anemia, unspecified: Secondary | ICD-10-CM | POA: Diagnosis not present

## 2022-03-13 DIAGNOSIS — G4733 Obstructive sleep apnea (adult) (pediatric): Secondary | ICD-10-CM | POA: Diagnosis not present

## 2022-03-13 DIAGNOSIS — F419 Anxiety disorder, unspecified: Secondary | ICD-10-CM

## 2022-03-13 DIAGNOSIS — Z794 Long term (current) use of insulin: Secondary | ICD-10-CM | POA: Diagnosis not present

## 2022-03-13 DIAGNOSIS — I480 Paroxysmal atrial fibrillation: Secondary | ICD-10-CM | POA: Diagnosis not present

## 2022-03-13 DIAGNOSIS — I739 Peripheral vascular disease, unspecified: Secondary | ICD-10-CM | POA: Diagnosis not present

## 2022-03-13 NOTE — Patient Outreach (Signed)
  Care Coordination   Follow Up Visit Note   03/13/2022 Name: [name] MRN: [id number] DOB: 1950/04/27  [name] is a 72 y.o. year old female who sees [name], [name], MD for primary care. I spoke with  [name] by phone today.  What matters to the patients health and wellness today?  "I had a fall today"     Goals Addressed             This Visit's Progress    Provide support, resources and mental health support       Care Coordination Interventions:  CSW spoke with pt today by phone- she reports a fall today- after her HHPT had come and gone- she called the HHPT and declined them coming back stating she was fine.  CSW discussed possible program she may be eligible for and benefit from called [facility]. Pt to call and ask to be evaluated for services in the home to include OT, home modifications, etc.   CSW suggested she might also be able to take SCAT- CSW awaits callback from [employer] to check on status of  her SCAT application.  Pt also may enjoy getting back involved with the [facility] where she use to go and swim, etc.   Will also await PT recommendations for ?SNF/level of care-     01/15/2022    1:50 PM 11/07/2021   11:44 AM 09/19/2021   11:55 AM 08/22/2021   11:16 AM 08/01/2021   11:04 AM  Depression screen PHQ 2/9  Decreased Interest 3 0 3 0 0  Down, Depressed, Hopeless 3 0 3 0 0  PHQ - 2 Score 6 0 6 0 0  Altered sleeping 3  3    Tired, decreased energy 3  3    Change in appetite 0  3    Feeling bad or failure about yourself  0  1    Trouble concentrating 2  1    Moving slowly or fidgety/restless 0  0    Suicidal thoughts 0  0    PHQ-9 Score 14  17    Difficult doing work/chores Somewhat difficult  Very difficult      Reviewed Care Coordination Services:  Assessed Social Determinants of Health Reviewed all upcoming appointments in Epic system Assisted pt with call to [employer]/SCAT re: application Emailed pt a link  for online application  for Medicaid Motivational Interviewing employed Depression screen reviewed  PHQ2/ PHQ9 completed Solution-Focused Strategies employed:  Active listening / Reflection utilized  Emotional Support Provided Problem [place] strategies reviewed Provided general psycho-education for mental health needs  Reviewed mental health medications and discussed importance of compliance:            SDOH assessments and interventions completed:  Yes     Care Coordination Interventions:  Yes, provided   Follow up plan: Referral made to  [facility]  Follow up call scheduled for 2/     Encounter Outcome:  Pt. Visit Completed

## 2022-03-13 NOTE — Patient Instructions (Signed)
Visit Information  Thank you for taking time to visit with me today. Please don't hesitate to contact me if I can be of assistance to you.   Following are the goals we discussed today:   Goals Addressed             This Visit's Progress    Provide support, resources and mental health support       Care Coordination Interventions:  CSW spoke with pt today by phone- she reports a fall today- after her HHPT had come and gone- she called the HHPT and declined them coming back stating she was fine.  CSW discussed possible program she may be eligible for and benefit from called [facility]. Pt to call and ask to be evaluated for services in the home to include OT, home modifications, etc.   CSW suggested she might also be able to take SCAT- CSW awaits callback from [employer] to check on status of  her SCAT application.  Pt also may enjoy getting back involved with the [facility] where she use to go and swim, etc.   Will also await PT recommendations for ?SNF/level of care-     01/15/2022    1:50 PM 11/07/2021   11:44 AM 09/19/2021   11:55 AM 08/22/2021   11:16 AM 08/01/2021   11:04 AM  Depression screen PHQ 2/9  Decreased Interest 3 0 3 0 0  Down, Depressed, Hopeless 3 0 3 0 0  PHQ - 2 Score 6 0 6 0 0  Altered sleeping 3  3    Tired, decreased energy 3  3    Change in appetite 0  3    Feeling bad or failure about yourself  0  1    Trouble concentrating 2  1    Moving slowly or fidgety/restless 0  0    Suicidal thoughts 0  0    PHQ-9 Score 14  17    Difficult doing work/chores Somewhat difficult  Very difficult      Reviewed Care Coordination Services:  Assessed Social Determinants of Health Reviewed all upcoming appointments in Epic system Assisted pt with call to [employer]/SCAT re: application Emailed pt a link for online application  for Medicaid Motivational Interviewing employed Depression screen reviewed  PHQ2/ PHQ9 completed Solution-Focused Strategies employed:  Active  listening / Reflection utilized  Emotional Support Provided Problem [place] strategies reviewed Provided general psycho-education for mental health needs  Reviewed mental health medications and discussed importance of compliance:            Our next appointment is by telephone on 03/18/22    Please call the care guide team at [phone] if you need to cancel or reschedule your appointment.   If you are experiencing a Mental Health or [place] or need someone to talk to, please call the Suicide and Crisis Lifeline: 988 call 911   The patient verbalized understanding of instructions, educational materials, and care plan provided today and DECLINED offer to receive copy of patient instructions, educational materials, and care plan.   Telephone follow up appointment with care management team member scheduled for:03/18/22  Eduard Clos, MSW, [place] Worker [employer] [phone]

## 2022-03-13 NOTE — Telephone Encounter (Signed)
Received call from [place], [place] with [facility] reporting patient's pain (fibromyalgia) today is 9/10. VSS BP 110/62 P 67 Temp 97.6.   Also, patient has been constipated. Taking miralax, prune juice, apple juice, and always has cup of ice nearby. Had BM yesterday that was "size of fist," and very hard. Patient stated this happens every now and then.

## 2022-03-14 ENCOUNTER — Telehealth: Payer: Self-pay

## 2022-03-14 ENCOUNTER — Other Ambulatory Visit (HOSPITAL_COMMUNITY): Payer: Self-pay

## 2022-03-14 MED ORDER — ROSUVASTATIN CALCIUM 20 MG PO TABS
20.0000 mg | ORAL_TABLET | Freq: Every day | ORAL | 3 refills | Status: DC
  Filled 2022-03-14 – 2022-05-19 (×6): qty 90, 90d supply, fill #0
  Filled 2022-08-12: qty 90, 90d supply, fill #1
  Filled 2022-12-23: qty 90, 90d supply, fill #2

## 2022-03-14 MED ORDER — ALPRAZOLAM 1 MG PO TABS
0.5000 mg | ORAL_TABLET | Freq: Every evening | ORAL | 0 refills | Status: DC | PRN
  Filled 2022-03-14: qty 45, 30d supply, fill #0

## 2022-03-14 MED ORDER — HYDROCODONE-ACETAMINOPHEN 5-325 MG PO TABS
1.0000 | ORAL_TABLET | Freq: Three times a day (TID) | ORAL | 0 refills | Status: DC
  Filled 2022-03-14: qty 180, 30d supply, fill #0

## 2022-03-14 MED ORDER — TRELEGY ELLIPTA 100-62.5-25 MCG/ACT IN AEPB
1.0000 | INHALATION_SPRAY | Freq: Every day | RESPIRATORY_TRACT | 4 refills | Status: DC
  Filled 2022-03-14 – 2022-05-18 (×2): qty 180, 90d supply, fill #0
  Filled 2022-08-12: qty 180, 90d supply, fill #1
  Filled 2022-12-23: qty 180, 90d supply, fill #2

## 2022-03-14 MED ORDER — WARFARIN SODIUM 2.5 MG PO TABS
ORAL_TABLET | ORAL | 3 refills | Status: DC
  Filled 2022-03-14 – 2022-05-18 (×2): qty 90, 90d supply, fill #0
  Filled 2022-08-12: qty 90, 90d supply, fill #1
  Filled 2022-09-05: qty 90, 90d supply, fill #2

## 2022-03-14 NOTE — Telephone Encounter (Signed)
Call to pt to offer next PREP classes starting in March.  Expressed she is working out on own and right now, doesn't think she can participate in [place].  Has number for call back should she change her mind.

## 2022-03-17 ENCOUNTER — Telehealth: Payer: Self-pay | Admitting: Dietician

## 2022-03-17 ENCOUNTER — Other Ambulatory Visit: Payer: Self-pay

## 2022-03-17 ENCOUNTER — Other Ambulatory Visit (HOSPITAL_COMMUNITY): Payer: Self-pay

## 2022-03-17 NOTE — Telephone Encounter (Signed)
Patient reported results of her FS POC PST INR result = 2.6 (target range 2.0 - 2.5) on 11.5 milligrams of warfarin/wk. Will REDUCE to 10 milligrams of warfarin/wk as: Su-1.'25mg'$ M-1.'25mg'$ T-1.'25mg'$ W-1.'25mg'$ Th-1.'25mg'$ F-2.'5mg'$ Sa-1.'25mg'$  using 2.'5mg'$  tabs. No bleeding symptoms or signs. Has adequate supply of warfarin on-hand she states.

## 2022-03-17 NOTE — Telephone Encounter (Signed)
[name] calls because she is having readings under '70mg'$ /dl for the past week, she has no symptoms and is eating fine. (The alarms are waking her) She says she has not opened a new container of tresiba, (should go through a pen about every 5-6 days which correlates to the start of the readings < '70mg'$ /dL). She is due to change her Continuous glucose monitoring sensor in a few minutes. She states she was on Tresiba 57units when on steroids, then decreased to 55units when she came off steroids. Currently taking -53units  which she started 2 days ago, no novolog.   I reviewed her Continuous glucose monitoring Report that shows 6 episodes <70 in the past 24 hours, and 1-2 events most days over the past week.  She was advised that her medication list says 48 units, so she agreed to lower her [name] insulin to that amount and change her Continuous glucose monitoring sensor now,  carry treatments for hypoglycemia with her at all times and to call if her blood sugars are < 70 mg/dl or above 250 mg/dL.

## 2022-03-18 ENCOUNTER — Ambulatory Visit: Payer: Self-pay | Admitting: *Deleted

## 2022-03-18 ENCOUNTER — Other Ambulatory Visit (HOSPITAL_COMMUNITY): Payer: Self-pay

## 2022-03-18 NOTE — Patient Outreach (Signed)
  Care Coordination   Follow Up Visit Note   03/18/2022 Name: [name] MRN: [id number] DOB: 09-01-1950  [name] is a 72 y.o. year old female who sees [name], Kathryn Pattee, MD for primary care. I spoke with  [name] by phone today.  What matters to the patients health and wellness today?  "I have not called yet"    Goals Addressed             This Visit's Progress    Provide support, resources and mental health support       Care Coordination Interventions:  CSW spoke with pt today by phone- she reports she has not called [facility] program yet to request an assessment for their program/support- pt "promises" to do it later today or tomorrow- stressed to pt the services they can offer.  Pt awaiting her next follow up HHPT visit- CSW has left messaged for [name], PT, as well to inquire   CSW suggested she might also be able to take SCAT- SCAT application is pending Pt also may enjoy getting back involved with the [name] where she use to go and swim, etc.   Will also await PT recommendations for ?SNF/level of care-     01/15/2022    1:50 PM 11/07/2021   11:44 AM 09/19/2021   11:55 AM 08/22/2021   11:16 AM 08/01/2021   11:04 AM  Depression screen PHQ 2/9  Decreased Interest 3 0 3 0 0  Down, Depressed, Hopeless 3 0 3 0 0  PHQ - 2 Score 6 0 6 0 0  Altered sleeping 3  3    Tired, decreased energy 3  3    Change in appetite 0  3    Feeling bad or failure about yourself  0  1    Trouble concentrating 2  1    Moving slowly or fidgety/restless 0  0    Suicidal thoughts 0  0    PHQ-9 Score 14  17    Difficult doing work/chores Somewhat difficult  Very difficult      Reviewed Care Coordination Services:  Assessed Social Determinants of Health Reviewed all upcoming appointments in Epic system Assisted pt with call to [employer]/SCAT re: application Emailed pt a link for online application  for Medicaid Motivational Interviewing  employed Depression screen reviewed  PHQ2/ PHQ9 completed Solution-Focused Strategies employed:  Active listening / Reflection utilized  Emotional Support Provided Problem [place] strategies reviewed Provided general psycho-education for mental health needs  Reviewed mental health medications and discussed importance of compliance:            SDOH assessments and interventions completed:  Yes     Care Coordination Interventions:  Yes, provided   Follow up plan: Follow up call scheduled for 03/20/22    Encounter Outcome:  Pt. Visit Completed

## 2022-03-18 NOTE — Telephone Encounter (Signed)
Patient called again today and left a message that she had forgotten how much [place] she was to take. RCT patient and left a message about the dose of Tresiba (48 units) that is on her medication list and was approved by her doctor.

## 2022-03-18 NOTE — Patient Instructions (Signed)
Visit Information  Thank you for taking time to visit with me today. Please don't hesitate to contact me if I can be of assistance to you.   Following are the goals we discussed today:   Goals Addressed             This Visit's Progress    Provide support, resources and mental health support       Care Coordination Interventions:  CSW spoke with pt today by phone- she reports she has not called [facility] program yet to request an assessment for their program/support- pt "promises" to do it later today or tomorrow- stressed to pt the services they can offer.  Pt awaiting her next follow up HHPT visit- CSW has left messaged for [place], PT, as well to inquire   CSW suggested she might also be able to take SCAT- SCAT application is pending Pt also may enjoy getting back involved with the [facility] where she use to go and swim, etc.   Will also await PT recommendations for ?SNF/level of care-     01/15/2022    1:50 PM 11/07/2021   11:44 AM 09/19/2021   11:55 AM 08/22/2021   11:16 AM 08/01/2021   11:04 AM  Depression screen PHQ 2/9  Decreased Interest 3 0 3 0 0  Down, Depressed, Hopeless 3 0 3 0 0  PHQ - 2 Score 6 0 6 0 0  Altered sleeping 3  3    Tired, decreased energy 3  3    Change in appetite 0  3    Feeling bad or failure about yourself  0  1    Trouble concentrating 2  1    Moving slowly or fidgety/restless 0  0    Suicidal thoughts 0  0    PHQ-9 Score 14  17    Difficult doing work/chores Somewhat difficult  Very difficult      Reviewed Care Coordination Services:  Assessed Social Determinants of Health Reviewed all upcoming appointments in Epic system Assisted pt with call to [employer]/SCAT re: application Emailed pt a link for online application  for Medicaid Motivational Interviewing employed Depression screen reviewed  PHQ2/ PHQ9 completed Solution-Focused Strategies employed:  Active listening / Reflection utilized  Emotional Support Provided Problem  [place] strategies reviewed Provided general psycho-education for mental health needs  Reviewed mental health medications and discussed importance of compliance:            Our next appointment is by telephone on 03/20/22 at 9am  Please call the care guide team at [phone] if you need to cancel or reschedule your appointment.   If you are experiencing a Mental Health or [place] or need someone to talk to, please call the Suicide and Crisis Lifeline: 988 call 911   The patient verbalized understanding of instructions, educational materials, and care plan provided today and DECLINED offer to receive copy of patient instructions, educational materials, and care plan.   Telephone follow up appointment with care management team member scheduled for: 03/20/22  Eduard Clos, MSW, [place] Worker [employer] [phone]

## 2022-03-19 ENCOUNTER — Ambulatory Visit: Payer: HMO | Admitting: Podiatry

## 2022-03-20 ENCOUNTER — Other Ambulatory Visit (HOSPITAL_COMMUNITY): Payer: Self-pay

## 2022-03-20 ENCOUNTER — Ambulatory Visit: Payer: Self-pay | Admitting: *Deleted

## 2022-03-20 ENCOUNTER — Other Ambulatory Visit: Payer: Self-pay

## 2022-03-20 MED ORDER — DULOXETINE HCL 60 MG PO CPEP
60.0000 mg | ORAL_CAPSULE | Freq: Two times a day (BID) | ORAL | 3 refills | Status: DC
  Filled 2022-03-20: qty 180, 90d supply, fill #0
  Filled 2022-06-12: qty 180, 90d supply, fill #1
  Filled 2022-06-13 – 2022-06-16 (×3): qty 60, 30d supply, fill #1
  Filled 2022-07-14: qty 60, 30d supply, fill #2
  Filled 2022-08-12: qty 60, 30d supply, fill #3
  Filled 2022-09-09: qty 60, 30d supply, fill #4
  Filled 2022-10-16: qty 60, 30d supply, fill #5
  Filled 2022-12-23: qty 60, 30d supply, fill #6
  Filled 2023-01-19: qty 60, 30d supply, fill #7
  Filled 2023-02-09 – 2023-02-17 (×2): qty 60, 30d supply, fill #8

## 2022-03-20 NOTE — Patient Instructions (Signed)
Visit Information  Thank you for taking time to visit with me today. Please don't hesitate to contact me if I can be of assistance to you.   Following are the goals we discussed today:   Goals Addressed             This Visit's Progress    Provide support, resources and mental health support       Care Coordination Interventions:  CSW spoke with pt today by phone- was also able to speak with the [place] who feels pt could benefit from SNF rehab if insurance will cover it.  CSW has requested the [facility] agency submit PT and OT notes for SNF search/auth. CSW will request FL2 from PCP and proceed once [facility] therapy notes are received.  Pt reports she has not called [facility] program yet to request an assessment for their program/support- pt "promises" to do it   CSW suggested she might also be able to take SCAT- SCAT application is pending Pt also may enjoy getting back involved with the [facility] where she use to go and swim, etc.       01/15/2022    1:50 PM 11/07/2021   11:44 AM 09/19/2021   11:55 AM 08/22/2021   11:16 AM 08/01/2021   11:04 AM  Depression screen PHQ 2/9  Decreased Interest 3 0 3 0 0  Down, Depressed, Hopeless 3 0 3 0 0  PHQ - 2 Score 6 0 6 0 0  Altered sleeping 3  3    Tired, decreased energy 3  3    Change in appetite 0  3    Feeling bad or failure about yourself  0  1    Trouble concentrating 2  1    Moving slowly or fidgety/restless 0  0    Suicidal thoughts 0  0    PHQ-9 Score 14  17    Difficult doing work/chores Somewhat difficult  Very difficult      Reviewed Care Coordination Services:  Assessed Social Determinants of Health Reviewed all upcoming appointments in Epic system Assisted pt with call to [employer]/SCAT re: application Emailed pt a link for online application  for Medicaid Motivational Interviewing employed Depression screen reviewed  PHQ2/ PHQ9 completed Solution-Focused Strategies employed:  Active listening / Reflection utilized   Emotional Support Provided Problem [place] strategies reviewed Provided general psycho-education for mental health needs  Reviewed mental health medications and discussed importance of compliance:            Our next appointment is by telephone on 03/24/22    Please call the care guide team at [phone] if you need to cancel or reschedule your appointment.   If you are experiencing a Mental Health or [place] or need someone to talk to, please call the Suicide and Crisis Lifeline: 988 call 911   The patient verbalized understanding of instructions, educational materials, and care plan provided today and DECLINED offer to receive copy of patient instructions, educational materials, and care plan.   Telephone follow up appointment with care management team member scheduled for:03/24/22  Eduard Clos, MSW, [place] Worker [employer] [phone]

## 2022-03-20 NOTE — Patient Outreach (Signed)
  Care Coordination   Follow Up Visit Note   03/20/2022 Name: [name] MRN: [id number] DOB: 12/03/1950  [name] is a 72 y.o. year old female who sees [name], Kathryn Pattee, MD for primary care. I spoke with  [name] by phone today.  What matters to the patients health and wellness today?  Physical therapy coming today- CSW requested call back when HHPT arrived.    Goals Addressed             This Visit's Progress    Provide support, resources and mental health support       Care Coordination Interventions:  CSW spoke with pt today by phone- was also able to speak with the [place] who feels pt could benefit from SNF rehab if insurance will cover it.  CSW has requested the [facility] agency submit PT and OT notes for SNF search/auth. CSW will request FL2 from PCP and proceed once [facility] therapy notes are received.  Pt reports she has not called [facility] program yet to request an assessment for their program/support- pt "promises" to do it   CSW suggested she might also be able to take SCAT- SCAT application is pending Pt also may enjoy getting back involved with the [facility] where she use to go and swim, etc.       01/15/2022    1:50 PM 11/07/2021   11:44 AM 09/19/2021   11:55 AM 08/22/2021   11:16 AM 08/01/2021   11:04 AM  Depression screen PHQ 2/9  Decreased Interest 3 0 3 0 0  Down, Depressed, Hopeless 3 0 3 0 0  PHQ - 2 Score 6 0 6 0 0  Altered sleeping 3  3    Tired, decreased energy 3  3    Change in appetite 0  3    Feeling bad or failure about yourself  0  1    Trouble concentrating 2  1    Moving slowly or fidgety/restless 0  0    Suicidal thoughts 0  0    PHQ-9 Score 14  17    Difficult doing work/chores Somewhat difficult  Very difficult      Reviewed Care Coordination Services:  Assessed Social Determinants of Health Reviewed all upcoming appointments in Epic system Assisted pt with call to [employer]/SCAT re:  application Emailed pt a link for online application  for Medicaid Motivational Interviewing employed Depression screen reviewed  PHQ2/ PHQ9 completed Solution-Focused Strategies employed:  Active listening / Reflection utilized  Emotional Support Provided Problem [place] strategies reviewed Provided general psycho-education for mental health needs  Reviewed mental health medications and discussed importance of compliance:            SDOH assessments and interventions completed:  Yes     Care Coordination Interventions:  Yes, provided   Follow up plan: Follow up call scheduled for 03/24/22    Encounter Outcome:  Pt. Visit Completed

## 2022-03-21 ENCOUNTER — Ambulatory Visit: Payer: Self-pay | Admitting: *Deleted

## 2022-03-21 ENCOUNTER — Other Ambulatory Visit: Payer: Self-pay

## 2022-03-21 ENCOUNTER — Other Ambulatory Visit (HOSPITAL_BASED_OUTPATIENT_CLINIC_OR_DEPARTMENT_OTHER): Payer: Self-pay

## 2022-03-21 ENCOUNTER — Other Ambulatory Visit (HOSPITAL_COMMUNITY): Payer: Self-pay

## 2022-03-24 ENCOUNTER — Telehealth: Payer: Self-pay

## 2022-03-24 ENCOUNTER — Other Ambulatory Visit (HOSPITAL_COMMUNITY): Payer: Self-pay

## 2022-03-24 ENCOUNTER — Ambulatory Visit: Payer: Self-pay | Admitting: *Deleted

## 2022-03-24 ENCOUNTER — Telehealth: Payer: Self-pay | Admitting: Pharmacist

## 2022-03-24 DIAGNOSIS — Z7901 Long term (current) use of anticoagulants: Secondary | ICD-10-CM | POA: Diagnosis not present

## 2022-03-24 DIAGNOSIS — I480 Paroxysmal atrial fibrillation: Secondary | ICD-10-CM | POA: Diagnosis not present

## 2022-03-24 NOTE — Patient Outreach (Signed)
  Care Coordination   Follow Up Visit Note   03/24/2022 Name: [name] MRN: [id number] DOB: 1950-08-11  Evans Lance Ertle is a 72 y.o. year old female who sees [name], [name], MD for primary care. I spoke with  [name] by phone today.  What matters to the patients health and wellness today?  [place] today    Goals Addressed             This Visit's Progress    Provide support, resources and mental health support       Care Coordination Interventions:  CSW spoke with pt today by phone- CSW still awaiting chart notes from [facility] agency for OT and PT to support I'm determining SNF appropriateness and insurance approval.  CSW will request FL2 from PCP and proceed once [facility] therapy notes are received.  Pt reports she  called [facility] program and left a message.         01/15/2022    1:50 PM 11/07/2021   11:44 AM 09/19/2021   11:55 AM 08/22/2021   11:16 AM 08/01/2021   11:04 AM  Depression screen PHQ 2/9  Decreased Interest 3 0 3 0 0  Down, Depressed, Hopeless 3 0 3 0 0  PHQ - 2 Score 6 0 6 0 0  Altered sleeping 3  3    Tired, decreased energy 3  3    Change in appetite 0  3    Feeling bad or failure about yourself  0  1    Trouble concentrating 2  1    Moving slowly or fidgety/restless 0  0    Suicidal thoughts 0  0    PHQ-9 Score 14  17    Difficult doing work/chores Somewhat difficult  Very difficult      Reviewed Care Coordination Services:  Assessed Social Determinants of Health Reviewed all upcoming appointments in Epic system Assisted pt with call to [employer]/SCAT re: application Emailed pt a link for online application  for Medicaid Motivational Interviewing employed Depression screen reviewed  PHQ2/ PHQ9 completed Solution-Focused Strategies employed:  Active listening / Reflection utilized  Emotional Support Provided Problem [place] strategies reviewed Provided general psycho-education for mental health needs   Reviewed mental health medications and discussed importance of compliance:            SDOH assessments and interventions completed:  Yes     Care Coordination Interventions:  Yes, provided   Follow up plan:  2 /15/24   Encounter Outcome:  Pt. Visit Completed

## 2022-03-24 NOTE — Telephone Encounter (Signed)
Patient reports results of PST FS POC INR = 1.7 on '10mg'$  warfarin over the past 1 week as: 1/2 x 2.'5mg'$  (1.'25mg'$  dose) all days of week except last Friday, dose was 2.'5mg'$ . Will INCREASE to 11.'25mg'$ /wk as: 2.'5mg'$  M/F; 1/2 x 2.'5mg'$  (1.'25mg'$  dose) all other days. No signs or symptoms of bleeding or embolic events. No missed doses. No new medications. Repeat PST FS POC INR in one week on 19-FEB-24.

## 2022-03-24 NOTE — Telephone Encounter (Signed)
Requesting to speak with a nurse about insulin degludec (TRESIBA FLEXTOUCH) 100 UNIT/ML FlexTouch Pen. Please call pt back.

## 2022-03-24 NOTE — Patient Instructions (Signed)
Visit Information  Thank you for taking time to visit with me today. Please don't hesitate to contact me if I can be of assistance to you.   Following are the goals we discussed today:   Goals Addressed             This Visit's Progress    Provide support, resources and mental health support       Care Coordination Interventions:  CSW spoke with pt today by phone- CSW still awaiting chart notes from [facility] agency for OT and PT to support I'm determining SNF appropriateness and insurance approval.  CSW will request FL2 from PCP and proceed once [facility] therapy notes are received.  Pt reports she  called [facility] program and left a message.         01/15/2022    1:50 PM 11/07/2021   11:44 AM 09/19/2021   11:55 AM 08/22/2021   11:16 AM 08/01/2021   11:04 AM  Depression screen PHQ 2/9  Decreased Interest 3 0 3 0 0  Down, Depressed, Hopeless 3 0 3 0 0  PHQ - 2 Score 6 0 6 0 0  Altered sleeping 3  3    Tired, decreased energy 3  3    Change in appetite 0  3    Feeling bad or failure about yourself  0  1    Trouble concentrating 2  1    Moving slowly or fidgety/restless 0  0    Suicidal thoughts 0  0    PHQ-9 Score 14  17    Difficult doing work/chores Somewhat difficult  Very difficult      Reviewed Care Coordination Services:  Assessed Social Determinants of Health Reviewed all upcoming appointments in Epic system Assisted pt with call to GTA/SCAT re: application Emailed pt a link for online application  for Medicaid Motivational Interviewing employed Depression screen reviewed  PHQ2/ PHQ9 completed Solution-Focused Strategies employed:  Active listening / Reflection utilized  Emotional Support Provided Problem [place] strategies reviewed Provided general psycho-education for mental health needs  Reviewed mental health medications and discussed importance of compliance:            Our next appointment is by telephone on 03/27/22   Please call the care  guide team at [phone] if you need to cancel or reschedule your appointment.   If you are experiencing a Mental Health or [place] or need someone to talk to, please call the Suicide and Crisis Lifeline: 988 call 911   The patient verbalized understanding of instructions, educational materials, and care plan provided today and DECLINED offer to receive copy of patient instructions, educational materials, and care plan.   Telephone follow up appointment with care management team member scheduled for: 03/27/22  Eduard Clos, MSW, [place] Worker [employer] [phone]

## 2022-03-24 NOTE — Telephone Encounter (Signed)
Return pt's call - she wanted to know correct dose of [place]. Informed pt according to med list it's 48 units daily. Pt stated her BS was high yesterday;unable to tell me how high. Today's BS is 247. Stated she thought the current dosage is 45 units. Also stated she was on Prednisone in the hospital. I will verify this with her PCP.

## 2022-03-24 NOTE — Telephone Encounter (Signed)
Pt called and informed 48 units of [name] is correct per Dr [name]. Pt repeated dosage.

## 2022-03-26 ENCOUNTER — Encounter: Payer: Self-pay | Admitting: Internal Medicine

## 2022-03-27 ENCOUNTER — Ambulatory Visit: Payer: Self-pay | Admitting: *Deleted

## 2022-03-27 NOTE — Patient Instructions (Signed)
Visit Information  Thank you for taking time to visit with me today. Please don't hesitate to contact me if I can be of assistance to you.   Following are the goals we discussed today:   Goals Addressed             This Visit's Progress    Provide support, resources and mental health support       Activities and task to complete in order to accomplish goals.    Call [facility] again  to follow up on referral for services Continue with home physical therapy  I will follow up again with [facility] to request PT and OT notes for level of care determination        Our next appointment is by telephone on 04/03/22    Please call the care guide team at [phone] if you need to cancel or reschedule your appointment.   If you are experiencing a Mental Health or [place] or need someone to talk to, please call the Suicide and Crisis Lifeline: 988 call 911   The patient verbalized understanding of instructions, educational materials, and care plan provided today and DECLINED offer to receive copy of patient instructions, educational materials, and care plan.   Telephone follow up appointment with care management team member scheduled for: 04/03/22 Eduard Clos, MSW, [place] Worker [employer] [phone]

## 2022-03-27 NOTE — Patient Outreach (Addendum)
  Care Coordination   Follow Up Visit Note   03/27/2022 Name: [name] MRN: [id number] DOB: [birth year]  [name] is a 72 y.o. year old female who sees [name], Kathryn Pattee, MD for primary care. I spoke with  [name] by phone today.  What matters to the patients health and wellness today?  Wants to go to rehab.    Goals Addressed             This Visit's Progress    Provide support, resources and mental health support       Activities and task to complete in order to accomplish goals.    Call [facility] again  to follow up on referral for services Continue with home physical therapy  I will follow up again with [facility] to request PT and OT notes for level of care determination        SDOH assessments and interventions completed:  Yes     Care Coordination Interventions:  Yes, provided  Interventions Today    Flowsheet Row Most Recent Value  General Interventions   General Interventions Discussed/Reviewed Community Resources        Follow up plan: Follow up call scheduled for 04/03/22    Encounter Outcome:  Pt. Visit Completed

## 2022-03-31 ENCOUNTER — Other Ambulatory Visit: Payer: Self-pay

## 2022-03-31 ENCOUNTER — Other Ambulatory Visit (HOSPITAL_COMMUNITY): Payer: Self-pay

## 2022-03-31 NOTE — Telephone Encounter (Signed)
Patient provides results of PST FS POC INR = 1.7 (Target range 2.0 - 2.5) on 11.56m warfarin per week. Will increase to 12.548mwarfarin/wk as: 1 x 2.20m44mWF; 1/2 x 2.20mg21m.220mg220me) all other days. Repeat INR [id number] or sx of embolic events

## 2022-04-02 ENCOUNTER — Telehealth: Payer: Self-pay

## 2022-04-02 NOTE — Telephone Encounter (Signed)
Returned call to Will, OT with [facility]. Requesting extension for HH OT 2 week 1, and 1 week 1 to work on breathing, and transfers, especially to shower chair. Verbal auth given. Will route to PCP for agreement/denial.  Will is also reporting patient's WT today is 162 lbs. This is above parameters of 150-160 lbs.  BP 134/67 P 54 RR 20 SpO2 94% on RA T 97.2

## 2022-04-02 NOTE — Telephone Encounter (Signed)
Physical therapist calling to report vitals on pt ...  Name will  OPT with [facility] --[phone]

## 2022-04-03 ENCOUNTER — Other Ambulatory Visit: Payer: Self-pay

## 2022-04-03 ENCOUNTER — Other Ambulatory Visit (HOSPITAL_COMMUNITY): Payer: Self-pay

## 2022-04-03 ENCOUNTER — Other Ambulatory Visit: Payer: Self-pay | Admitting: Internal Medicine

## 2022-04-03 ENCOUNTER — Encounter: Payer: Self-pay | Admitting: *Deleted

## 2022-04-03 DIAGNOSIS — J449 Chronic obstructive pulmonary disease, unspecified: Secondary | ICD-10-CM

## 2022-04-03 MED ORDER — IPRATROPIUM-ALBUTEROL 0.5-2.5 (3) MG/3ML IN SOLN
3.0000 mL | Freq: Four times a day (QID) | RESPIRATORY_TRACT | 3 refills | Status: DC | PRN
  Filled 2022-04-03: qty 90, 8d supply, fill #0
  Filled 2022-09-05: qty 90, 8d supply, fill #1
  Filled 2023-01-03: qty 90, 8d supply, fill #2
  Filled 2023-03-19: qty 90, 8d supply, fill #3

## 2022-04-03 NOTE — Patient Outreach (Signed)
  Care Coordination   Follow Up Visit Note   04/03/2022 Name: Shyra Kitsmiller MRN: [id number] DOB: 12/26/1950  Evans Lance Gotto is a 72 y.o. year old female who sees [name], [name], MD for primary care. I spoke with  [name] by phone today.  What matters to the patients health and wellness today?  "Getting more therapy/going to rehab"    Goals Addressed             This Visit's Progress    Provide support, resources and mental health support       Activities and task to complete in order to accomplish goals.    [facility] team has made follow up outreach today regarding your calls left to [facility]- expect phone call from them Continue with home physical therapy  To determine if pt is eligible/in need of SNF rehab -vs- continued HHPT PCP conversation and pt follow up visit to discuss and complete FL2 if determined SNF plan        SDOH assessments and interventions completed:  Yes     Care Coordination Interventions:  Yes, provided   Follow up plan: Follow up call scheduled for 04/09/22    Encounter Outcome:  Pt. Visit Completed

## 2022-04-03 NOTE — Patient Instructions (Signed)
Visit Information  Thank you for taking time to visit with me today. Please don't hesitate to contact me if I can be of assistance to you.   Following are the goals we discussed today:   Goals Addressed             This Visit's Progress    Provide support, resources and mental health support       Activities and task to complete in order to accomplish goals.    [facility] team has made follow up outreach today regarding your calls left to [facility]- expect phone call from them Continue with home physical therapy  To determine if pt is eligible/in need of SNF rehab -vs- continued HHPT PCP conversation and pt follow up visit to discuss and complete FL2 if determined SNF plan        Our next appointment is by telephone on 04/09/22  Please call the care guide team at [phone] if you need to cancel or reschedule your appointment.   If you are experiencing a Mental Health or [place] or need someone to talk to, please call the Suicide and Crisis Lifeline: 988 call 911   The patient verbalized understanding of instructions, educational materials, and care plan provided today and DECLINED offer to receive copy of patient instructions, educational materials, and care plan.   Telephone follow up appointment with care management team member scheduled for: 04/09/22  Eduard Clos, MSW, [place] Worker [employer] [phone]

## 2022-04-07 ENCOUNTER — Telehealth: Payer: Self-pay | Admitting: Pharmacist

## 2022-04-07 NOTE — Telephone Encounter (Signed)
Patient reports PST FS POC INR 2.1 (Target range 2.0 - 2.5) on 12.'5mg'$  warfarin/wk as: 1x2.'5mg'$  tab MWF; 1/2 x 2.'5mg'$  (1.25 mg dose) on Tu/Th/Sa/Su. No bleeding or embolic s/s's. CONT same regimen of 12.'5mg'$  warfarin/wk as shown above.

## 2022-04-08 ENCOUNTER — Telehealth: Payer: Self-pay | Admitting: *Deleted

## 2022-04-09 ENCOUNTER — Encounter: Payer: Self-pay | Admitting: Internal Medicine

## 2022-04-09 NOTE — Telephone Encounter (Signed)
RTC from patient states has a headache from her fall last night.  Refuses to come in for assessment.  States always has a headache when she wakes up. Is alert  and oriented.   Will think about reclining in her recliner and not sitting up.  This is not the first time this has happened.

## 2022-04-09 NOTE — Telephone Encounter (Signed)
RTC to Patient and Danae Chen.  Message was left that Clinics had called.

## 2022-04-11 DIAGNOSIS — G4733 Obstructive sleep apnea (adult) (pediatric): Secondary | ICD-10-CM | POA: Diagnosis not present

## 2022-04-14 ENCOUNTER — Other Ambulatory Visit: Payer: Self-pay | Admitting: Pharmacist

## 2022-04-14 ENCOUNTER — Other Ambulatory Visit: Payer: Self-pay

## 2022-04-14 NOTE — Telephone Encounter (Signed)
Patient reports PST FS POC INR results 1.8 (Target INR range 2.0 - 2.5) on 12.'5mg'$  warfarin per week. Will increase to '15mg'$  warfarin per week as: 1 x 2.'5mg'$  M-F; 1/2 x 2.'5mg'$  (1.'25mg'$ ) on Sa/Su. Repeat PST Monday 11MAR24. No bleeding.

## 2022-04-15 ENCOUNTER — Other Ambulatory Visit: Payer: Self-pay

## 2022-04-16 DIAGNOSIS — R2689 Other abnormalities of gait and mobility: Secondary | ICD-10-CM | POA: Diagnosis not present

## 2022-04-16 DIAGNOSIS — G3184 Mild cognitive impairment, so stated: Secondary | ICD-10-CM | POA: Diagnosis not present

## 2022-04-16 DIAGNOSIS — Z7985 Long-term (current) use of injectable non-insulin antidiabetic drugs: Secondary | ICD-10-CM | POA: Diagnosis not present

## 2022-04-16 DIAGNOSIS — E1151 Type 2 diabetes mellitus with diabetic peripheral angiopathy without gangrene: Secondary | ICD-10-CM | POA: Diagnosis not present

## 2022-04-16 DIAGNOSIS — I480 Paroxysmal atrial fibrillation: Secondary | ICD-10-CM | POA: Diagnosis not present

## 2022-04-16 DIAGNOSIS — I739 Peripheral vascular disease, unspecified: Secondary | ICD-10-CM | POA: Diagnosis not present

## 2022-04-16 DIAGNOSIS — D649 Anemia, unspecified: Secondary | ICD-10-CM | POA: Diagnosis not present

## 2022-04-16 DIAGNOSIS — Z794 Long term (current) use of insulin: Secondary | ICD-10-CM | POA: Diagnosis not present

## 2022-04-16 DIAGNOSIS — E1140 Type 2 diabetes mellitus with diabetic neuropathy, unspecified: Secondary | ICD-10-CM | POA: Diagnosis not present

## 2022-04-16 DIAGNOSIS — Z7901 Long term (current) use of anticoagulants: Secondary | ICD-10-CM | POA: Diagnosis not present

## 2022-04-16 IMAGING — NM NM SCAN TUMOR LOCALIZE WITH SPECT
2 series · 7 of 7 positions shown · non-contrast
Comparison: none

CLINICAL DATA: HEART FAILURE. CONCERN FOR CARDIAC AMYLOIDOSIS.

EXAM:
NUCLEAR MEDICINE TUMOR LOCALIZATION. PYP CARDIAC AMYLOIDOSIS SCAN
WITH SPECT
TECHNIQUE: Following intravenous administration of radiopharmaceutical,
anterior planar images of the chest were obtained. Regions of
interest were placed on the heart and contralateral chest wall for
quantitative assessment. Additional SPECT imaging of the chest was
obtained.
RADIOPHARMACEUTICALS:  21.4 mCi TECHNETIUM 99 PYROPHOSPHATE

[Series 2: anterior · 4.14mm/px · 1 of 1 slices shown]
[im 1/1]
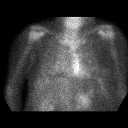

[Series 3: amyloid spect · 4.14mm/px · 6 of 64 frames shown]
[frame 6/64]
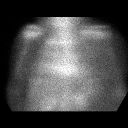
[frame 16/64]
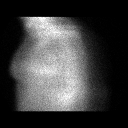
[frame 27/64]
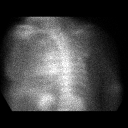
[frame 38/64]
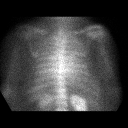
[frame 48/64]
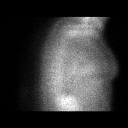
[frame 59/64]
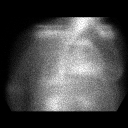

[7 of 7 positions shown; findings below may reference images not displayed]

FINDINGS: Planar Visual assessment:

Anterior planar imaging demonstrates radiotracer uptake within the
heart less than than uptake within the adjacent ribs (Grade 1).

Quantitative assessment :

Quantitative assessment of the cardiac uptake compared to the
contralateral chest wall is equal to 1.04 (H/CL = 1.04).

SPECT assessment: SPECT imaging of the chest demonstrates mild
radiotracer accumulation within the LEFT ventricle.
IMPRESSION: Visual and quantitative assessment (grade 1, H/CL = 1.04) are
equivocal for transthyretin amyloidosis.

## 2022-04-18 ENCOUNTER — Telehealth: Payer: Self-pay | Admitting: *Deleted

## 2022-04-18 NOTE — Telephone Encounter (Signed)
Will notified to change parameters to call only with incapacitating pain > 10.

## 2022-04-18 NOTE — Telephone Encounter (Signed)
Received call from Will, OT with [facility]. Requesting VO to extend [facility] OT 1 time next week for Re-certification eval. Verbal auth given. Will route to PCP for agreement/denial.  Also, Will is wanting to know if it is ok to change pain parameters. Right now they have to call office every time pain is > 6. States patient's pain is always > 6, usually an 8/10.

## 2022-04-19 ENCOUNTER — Other Ambulatory Visit (HOSPITAL_COMMUNITY): Payer: Self-pay

## 2022-04-21 ENCOUNTER — Other Ambulatory Visit (HOSPITAL_COMMUNITY): Payer: Self-pay

## 2022-04-21 ENCOUNTER — Encounter: Payer: HMO | Admitting: *Deleted

## 2022-04-21 ENCOUNTER — Other Ambulatory Visit: Payer: Self-pay

## 2022-04-21 ENCOUNTER — Other Ambulatory Visit: Payer: Self-pay | Admitting: Pharmacist

## 2022-04-21 DIAGNOSIS — Z7901 Long term (current) use of anticoagulants: Secondary | ICD-10-CM | POA: Diagnosis not present

## 2022-04-21 DIAGNOSIS — I480 Paroxysmal atrial fibrillation: Secondary | ICD-10-CM | POA: Diagnosis not present

## 2022-04-21 NOTE — Telephone Encounter (Signed)
Patient reports PST FS POC INR determination from Monday 11-MAR-24 as 2.2 on warfarin 2.'5mg'$  PO once daily on days Monday - Friday. She takes 1/2 x 2.'5mg'$  (1.'25mg'$  dose) on Saturday/Sunday. Was advised to CONTINUE this same regimen. NO bleeding, symptoms/signs of embolic events, no new medications, no missed doses. Repeat PST on Monday 18-MAR-24.

## 2022-04-22 ENCOUNTER — Ambulatory Visit: Payer: Self-pay | Admitting: *Deleted

## 2022-04-22 ENCOUNTER — Other Ambulatory Visit (HOSPITAL_COMMUNITY): Payer: Self-pay

## 2022-04-22 NOTE — Patient Instructions (Signed)
Visit Information  Thank you for taking time to visit with me today. Please don't hesitate to contact me if I can be of assistance to you.   Following are the goals we discussed today:   Goals Addressed             This Visit's Progress    Provide support, resources and mental health support       Activities and task to complete in order to accomplish goals.    Complete [facility] paperwork and send in! Continue with home physical therapy  To determine if pt is eligible/in need of SNF rehab -vs- continued [place]!        Our next appointment is by telephone on 05/02/22    Please call the care guide team at [phone] if you need to cancel or reschedule your appointment.   If you are experiencing a Mental Health or [place] or need someone to talk to, please call the Suicide and Crisis Lifeline: 988 call 911   The patient verbalized understanding of instructions, educational materials, and care plan provided today and DECLINED offer to receive copy of patient instructions, educational materials, and care plan.   Telephone follow up appointment with care management team member scheduled for:05/02/22  Eduard Clos, MSW, [place] Worker [employer] [phone]

## 2022-04-22 NOTE — Patient Outreach (Signed)
  Care Coordination   Follow Up Visit Note   04/22/2022 Name: Vadis Slabach MRN: [id number] DOB: 07-16-1950  Evans Lance Vavrek is a 72 y.o. year old female who sees [name], [name], MD for primary care. I spoke with  [name] by phone today.  What matters to the patients health and wellness today?  Doing ok- birthday is this week!      Goals Addressed             This Visit's Progress    Provide support, resources and mental health support       Activities and task to complete in order to accomplish goals.    Complete [facility]ging [facility] paperwork and send in! Continue with home physical therapy  To determine if pt is eligible/in need of SNF rehab -vs- continued [place]!        SDOH assessments and interventions completed:  Yes     Care Coordination Interventions:  Yes, provided  Interventions Today    Flowsheet Row Most Recent Value  General Interventions   General Interventions Discussed/Reviewed Community Resources  [Pt needs to complete the paperwork for Aging Gracefully program]  Exercise Interventions   Exercise Discussed/Reviewed Exercise Discussed, Physical Activity  Physical Activity Discussed/Reviewed Physical Activity Discussed  [continue with HH therapies]  Mental Health Interventions   Mental Health Discussed/Reviewed Coping Strategies       Follow up plan: Follow up call scheduled for 05/02/22    Encounter Outcome:  Pt. Visit Completed

## 2022-04-23 ENCOUNTER — Other Ambulatory Visit (HOSPITAL_COMMUNITY): Payer: Self-pay

## 2022-04-25 ENCOUNTER — Telehealth: Payer: Self-pay

## 2022-04-25 NOTE — Telephone Encounter (Signed)
Will with [facility] requesting to speak with a nurse. He's in the pt home, please call back.

## 2022-04-25 NOTE — Telephone Encounter (Signed)
Return call to Will OT with [facility]. First Will stated pt this morning; she slid off the bed; no injuries but c/o of soreness of arms/legs/back pain level of 6/7. Pt refused to go to the ER. Will is also requesting verbal order for "Extension of OT d/t falls for once a week x 4 weeks". VO given - sending to [facility] for denial or approval.

## 2022-04-28 NOTE — Telephone Encounter (Signed)
Patient provided late day results of PST FS POC INR 2.8. Target range 2.0 - 2.5. Has been taking 15 milligrams this past week. OMITTED tonights dose. Reduced to 1x2.5mg  Tu-Fril 1/2 x 2.5mg  (1.25mg ) Sa/Su; Repeat INR 25-MAR-24. No bleeding. No new medications.

## 2022-04-30 ENCOUNTER — Telehealth: Payer: Self-pay | Admitting: Dietician

## 2022-04-30 NOTE — Telephone Encounter (Signed)
Discussed CGM alarms for low and high blood sugars and how to properly treat hypoglycemia with 15 grams carb and wait 15-30 minutes before having more carb and causing hyperglycemia. She denies symptoms of either. She has an appointment with her doctor tomorrow when CGM download will be reviewed. Marland Kitchen

## 2022-05-01 ENCOUNTER — Ambulatory Visit (INDEPENDENT_AMBULATORY_CARE_PROVIDER_SITE_OTHER): Payer: PPO | Admitting: Internal Medicine

## 2022-05-01 ENCOUNTER — Ambulatory Visit (INDEPENDENT_AMBULATORY_CARE_PROVIDER_SITE_OTHER): Payer: PPO

## 2022-05-01 ENCOUNTER — Other Ambulatory Visit: Payer: Self-pay

## 2022-05-01 VITALS — BP 127/51 | HR 64 | Temp 97.7°F | Ht 62.0 in | Wt 165.5 lb

## 2022-05-01 DIAGNOSIS — R5381 Other malaise: Secondary | ICD-10-CM | POA: Diagnosis not present

## 2022-05-01 DIAGNOSIS — I110 Hypertensive heart disease with heart failure: Secondary | ICD-10-CM

## 2022-05-01 DIAGNOSIS — E118 Type 2 diabetes mellitus with unspecified complications: Secondary | ICD-10-CM

## 2022-05-01 DIAGNOSIS — E1151 Type 2 diabetes mellitus with diabetic peripheral angiopathy without gangrene: Secondary | ICD-10-CM | POA: Diagnosis not present

## 2022-05-01 DIAGNOSIS — Z0000 Encounter for general adult medical examination without abnormal findings: Secondary | ICD-10-CM

## 2022-05-01 DIAGNOSIS — Z7985 Long-term (current) use of injectable non-insulin antidiabetic drugs: Secondary | ICD-10-CM

## 2022-05-01 DIAGNOSIS — I5032 Chronic diastolic (congestive) heart failure: Secondary | ICD-10-CM

## 2022-05-01 DIAGNOSIS — I480 Paroxysmal atrial fibrillation: Secondary | ICD-10-CM

## 2022-05-01 DIAGNOSIS — G4734 Idiopathic sleep related nonobstructive alveolar hypoventilation: Secondary | ICD-10-CM

## 2022-05-01 DIAGNOSIS — R296 Repeated falls: Secondary | ICD-10-CM

## 2022-05-01 DIAGNOSIS — Z794 Long term (current) use of insulin: Secondary | ICD-10-CM

## 2022-05-01 DIAGNOSIS — J4489 Other specified chronic obstructive pulmonary disease: Secondary | ICD-10-CM | POA: Diagnosis not present

## 2022-05-01 DIAGNOSIS — G479 Sleep disorder, unspecified: Secondary | ICD-10-CM

## 2022-05-01 DIAGNOSIS — I2723 Pulmonary hypertension due to lung diseases and hypoxia: Secondary | ICD-10-CM

## 2022-05-01 DIAGNOSIS — R413 Other amnesia: Secondary | ICD-10-CM

## 2022-05-01 DIAGNOSIS — I739 Peripheral vascular disease, unspecified: Secondary | ICD-10-CM

## 2022-05-01 DIAGNOSIS — J449 Chronic obstructive pulmonary disease, unspecified: Secondary | ICD-10-CM

## 2022-05-01 DIAGNOSIS — I10 Essential (primary) hypertension: Secondary | ICD-10-CM

## 2022-05-01 DIAGNOSIS — F1190 Opioid use, unspecified, uncomplicated: Secondary | ICD-10-CM | POA: Diagnosis not present

## 2022-05-01 LAB — POCT GLYCOSYLATED HEMOGLOBIN (HGB A1C): Hemoglobin A1C: 7.5 % — AB (ref 4.0–5.6)

## 2022-05-01 LAB — GLUCOSE, CAPILLARY: Glucose-Capillary: 168 mg/dL — ABNORMAL HIGH (ref 70–99)

## 2022-05-01 MED ORDER — SEMAGLUTIDE (1 MG/DOSE) 4 MG/3ML ~~LOC~~ SOPN
1.0000 mg | PEN_INJECTOR | SUBCUTANEOUS | 5 refills | Status: DC
  Filled 2022-05-01: qty 3, 28d supply, fill #0
  Filled 2022-06-02: qty 3, 28d supply, fill #1
  Filled 2022-06-26: qty 3, 28d supply, fill #2
  Filled 2022-08-06: qty 3, 28d supply, fill #3
  Filled 2022-08-29: qty 3, 28d supply, fill #4
  Filled 2022-09-29: qty 3, 28d supply, fill #5

## 2022-05-01 NOTE — Addendum Note (Signed)
Addended by: Kerin Perna on: 05/01/2022 02:57 PM   Modules accepted: Level of Service

## 2022-05-01 NOTE — Progress Notes (Signed)
Ms. Santino is here for routine f/u visit after a hiatus, during which she has been receiving home PT to address her gait instability and frequent falls (fortunately none with significant injury - she is on anticoagulation).  She is accompanied by her sister Meriam Sprague.  Ms. Bollen continues to experience severe chronic pain and recurrent falls.  She feels that she has not benefited at all from interventions.  She reports that she is sleeping most of the day, doing nothing, has seen all of the tv she wants to watch, is bored and depressed.  When asked about food, she states that she eats junk all day", and eats "good food when [place] brings it".  Unable to do much of anything anymore.  She falls out of her chair frequently, and has fallen out of bed. She needs a new nocturnal oxygen company and is waiting on a new rollator.    At last visit we had discussed whether it was necessary for her to move to a care facility.  Plan was for HHPT/OT evaluation to determine skilled need.  Evidently there are no skilled needs and the recommended LOC would be ALF, not financially feasible unless she sold her home, currently not a palatable option for her.  Sister Meriam Sprague concerned about lack of assistance resources (as a Advertising account executive, she notes her clients frequently have assistance in the home and wonders why [place] isn't eligible); she is also very concerned about Ms. Haefner's confusion and memory loss.   All meds reviewed today.  She doesn't take all of them all of the time.    BP (!) 127/51 (BP Location: Left Arm, Patient Position: Sitting, Cuff Size: Small)   Pulse 64   Temp 97.7 F (36.5 C) (Oral)   Ht 5\' 2"  (1.575 m)   Wt 165 lb 8 oz (75.1 kg)   SpO2 95%   BMI 30.27 kg/m  Appears tired, chronically unwell, sitting in transport chair, not as chipper as usual, depressed affect.  Awake and alert and responses are appropriate.  Lungs with scattered few rhonchi in osterior fields, no dependent rales, no  respiratory distress.   Voice and laugh sound chronically hoarse (smoking).  Heart irreg irreg.  No JVD and no dependent edema.  Feet and lower legs are chronically dusky and pulseless, toes cool but not cold, no open areas or wounds.  Toenails trimmed except for great toenails which need different instruments (refer to podiatry).    Assessment and plan (not in order of priority)  Essential hypertension BP controlled at 127/51 on home regimen.  No changes, monitor.  Chronic congestive heart failure with left ventricular diastolic dysfunction (HCC) Lungs clearer than usual (chronic rhonchi are common), no JVD, no LE edema.  Dyspnea is multifactorial but largely due to COPD. No changes, monitor.   PAF (paroxysmal atrial fibrillation) (HCC) Continues close monitoring in Dr. coumadin clinic.  Fortunately no bleeding events despite numerous falls (several from seated position or out of bed).  No changes, monitor.  Pulmonary hypertension due to chronic obstructive pulmonary disease (HCC) No clinical manifestations (no JVD or LE edema).  Continue to monitor.  Peripheral arterial disease (HCC) Feet chronically cool, dusky, pulseless - as they are today.  No foot wounds. All but the great toenails were trimmed, which will be referred to podiatrist.      Type 2 diabetes mellitus with complications (HCC) A1c increased to 7.5 on Tresiba 49 units daily and Ozempic daily.  Endorses poor diet.  Increase Ozempic to  1 mg weekly.    Sleep disturbance Continues to have fragmented non-restorative sleep and reports sleeping most of the day.  Medications are suspected as sedating, particularly if they aren't taken as prescribed (as in the occasion of forgetting last doses and exceeding dosing unintentionally).  With her hx of COPD I will need to consider possibility of CO2 retention.    Nocturnal hypoxia per sleep study 07/2019 Having difficulty with current DME provider and requests referral to  alternative for 02- referred to [facility]   COPD mixed type (HCC) No exacerbations, continues prescribed inhalers. Following with Dr. Maple Hudson.    Memory difficulties Again no time at this visit to more formally check her cognitive function.  She would probably need an appointment specifically for this.  My concern remains polypharmacy with a number of centrally active meds contributing to her memory loss and confusion, and her underlying depression could contribute as well.  Without stopping/minimizing the meds, however, it will be very difficult to elucidate whether she has underlying neurodegenerative conditio.    Multifactorial functional impairment Did not qualify for SNF for short term rehab, though HHPT continues in effort to optimize gait and minimize fall risk.  She is not thriving.  We are exploring HHA/PCS services if she qualifies.  She does need 24 supervision particularly given fall risk, and to ensure no errors in medication regimen (confusion, poor sleep/wake cycle, oversedation all pose high risk for unintended mistakes ). ALF is most appropriate but is currently not financially feasible without selling the home, something she is not yet ready to do.  Ongoing challenge.    Return for close f/u.

## 2022-05-01 NOTE — Progress Notes (Signed)
Subjective:   [name] is a 72 y.o. female who presents for an Initial Medicare Annual Wellness Visit. I connected with  [name] on 05/01/22 by a  Face-To-Face encounter  and verified that I am speaking with the correct person using two identifiers.  Patient Location: Other:  Office/Clinic  Provider Location: Office/Clinic  I discussed the limitations of evaluation and management by telemedicine. The patient expressed understanding and agreed to proceed.  Review of Systems    Defer to PCP       Objective:    Today's Vitals   05/01/22 1425  BP: (!) 127/51  Pulse: 64  Temp: 97.7 F (36.5 C)  TempSrc: Oral  SpO2: 95%  Weight: 165 lb 8 oz (75.1 kg)  Height: 5\' 2"  (1.575 m)  PainSc: 10-Worst pain ever   Body mass index is 30.27 kg/m.     05/01/2022    2:27 PM 05/01/2022   10:28 AM 02/13/2022   11:05 AM 01/30/2022   12:08 PM 01/23/2022    8:48 AM 01/17/2022    1:00 PM 01/16/2022   10:58 AM  Advanced Directives  Does Patient Have a Medical Advance Directive? No No No No No No No  Would patient like information on creating a medical advance directive? No - Patient declined No - Patient declined No - Patient declined No - Patient declined No - Patient declined No - Patient declined No - Patient declined    Current Medications (verified) Outpatient Encounter Medications as of 05/01/2022  Medication Sig   albuterol (PROAIR HFA) 108 (90 Base) MCG/ACT inhaler Inhale 2 puffs into the lungs every 6 (six) hours as needed for shortness of breath.   ALPRAZolam (XANAX) 1 MG tablet Take 1/2 - 1 tablet (1/2  - 1 mg total) by mouth at bedtime as needed for anxiety or sleep. May take 1/2 tablet during the day if needed for anxiety   Blood Glucose Monitoring Suppl (ONETOUCH VERIO REFLECT) w/Device KIT Use to check blood sugar 1 time a day   buPROPion (WELLBUTRIN XL) 150 MG 24 hr tablet Take 1 tablet (150 mg total) by mouth every morning.   cetirizine (ZYRTEC  ALLERGY) 10 MG tablet Take 1 tablet (10 mg total) by mouth daily.   clobetasol ointment (TEMOVATE) 0.05 % Apply to affected area(s) 2 times daily as needed for (irritation).   Continuous Blood Gluc Sensor (FREESTYLE LIBRE 3 SENSOR) MISC Place 1 sensor on the skin every 14 days. Use to check glucose 6 times daily as directed   DULoxetine (CYMBALTA) 60 MG capsule Take 1 capsule (60 mg total) by mouth 2 (two) times daily.   Fluticasone-Umeclidin-Vilant (TRELEGY ELLIPTA) 100-62.5-25 MCG/ACT AEPB Inhale 1 puff into the lungs daily.   furosemide (LASIX) 80 MG tablet Take 1 tablet by mouth every morning AND 1/2 tablet daily in the afternoon. (Patient taking differently: Take 1 tablet  80 mg by mouth every morning AND 1/2 tablet 40 mg  daily in the afternoon.)   gabapentin (NEURONTIN) 300 MG capsule Take 1 capsule in the morning and 2 capsules at night (Patient taking differently: Take 600 mg by mouth See admin instructions. Take 2 capsules  (600 mg) in the afternnon and 2 capsules( 600 mg) at night)   HYDROcodone-acetaminophen (NORCO/VICODIN) 5-325 MG tablet Take 1-2 tablets by mouth 3 (three) times daily.   insulin degludec (TRESIBA FLEXTOUCH) 100 UNIT/ML FlexTouch Pen Inject 48 Units into the skin daily.   Insulin Pen Needle 32G X 4 MM MISC  Use to inject insulin up to 4 times a day   ipratropium (ATROVENT) 0.03 % nasal spray Place 2 sprays into the nose 3 (three) times daily.   ipratropium-albuterol (DUONEB) 0.5-2.5 (3) MG/3ML SOLN Inhale 3 mLs by nebulization every 6 (six) hours as needed (shortness of breath, wheezing).   ketoconazole (NIZORAL) 2 % cream Apply 1 application topically at bedtime as needed for irritation.   Lancets Misc. (ACCU-CHEK FASTCLIX LANCET) KIT Check your blood 4 times a day dx code 250.00 insulin requiring   lidocaine (LIDODERM) 5 % Place 1 or 2 patches to painful area of back each day.  Remove & Discard patch within 12 hours or as directed by MD   meclizine (ANTIVERT) 25 MG  tablet Take 1 tablet (25 mg total) by mouth 2 (two) times daily as needed for dizziness.   metoprolol succinate (TOPROL XL) 25 MG 24 hr tablet Take 1 tablet (25 mg total) by mouth daily.   nystatin powder Apply topically 3  times daily.   omeprazole (PRILOSEC) 40 MG capsule Take 1 capsule by mouth in the morning and at bedtime. (Patient taking differently: Take 40 mg by mouth 2 (two) times daily.)   ondansetron (ZOFRAN) 4 MG tablet Take 1 tablet (4 mg total) by mouth every 8 (eight) hours as needed for nausea or vomiting.   OXYGEN Inhale 2 L into the lungs at bedtime.   potassium chloride SA (KLOR-CON M) 20 MEQ tablet Take 2 tablets by mouth in the morning and 1 tablet in the afternoon.   rosuvastatin (CRESTOR) 20 MG tablet Take 1 tablet (20 mg total) by mouth at bedtime.   Semaglutide, 1 MG/DOSE, 4 MG/3ML SOPN Inject 1 mg into the skin once a week.   senna-docusate (SENOKOT S) 8.6-50 MG tablet Take 1 to 2 tablets by mouth once or twice a day as needed for constipation   sennosides-docusate sodium (SENOKOT-S) 8.6-50 MG tablet Take 2 tablets by mouth every other day as needed for constipation. Hold for loose stools   warfarin (COUMADIN) 2.5 MG tablet Take 1 tablet by mouth daily unless otherwise instructed.   No facility-administered encounter medications on file as of 05/01/2022.    Allergies (verified) Dexilant [dexlansoprazole], Lorazepam, Oxycontin [oxycodone], and Tramadol hcl   History: Past Medical History:  Diagnosis Date   Abnormality of lung on CXR 02/02/2020   Nonspecific finding on CXR ordered by pulmonologist - c/w inflammation vs infection, f/u imaging suggested, Dr. [name] office has been in communication about recommended next steps.   Anxiety 07/24/2010   Aortic atherosclerosis ([place]) 10/19/2014   Seen on CT scan, currently asymptomatic   Arteriovenous malformation of gastrointestinal tract 08/08/2015   Non-bleeding when visualized on capsule endoscopy 06/30/2015     Arthritis    "lower back; hands" (02/19/2018)   Asthma    Asymptomatic cholelithiasis 09/25/2015   Seen on CT scan 08/2015   Carotid artery stenosis; s/p R endarterectomy    s/p right endarterectomy (06/2010) Carotid US (07/2010):  Left: Moderate-to-severe (60-79%) calcific and non-calcific plaque origin and proximal ICA and ECA    Chronic congestive heart failure with left ventricular diastolic dysfunction (HCC) 10/21/2010   Chronic constipation 02/03/2011   Chronic daily headache 01/16/2014   Chronic iron deficiency anemia    Chronic low back pain 10/06/2012   Chronic venous insufficiency 08/04/2012   Closed fracture of one rib of left side 08/23/2021   ED visit after a fall 08/16/21    COPD (chronic obstructive pulmonary disease) with emphysema ([place])  PFTs 2018: severe obstructive disease, insignif response to bronchiodilator, mild restriction parenchymal pattern, moderately severe diffusion defect. 2014  FEV1 0.92 (40%), ratio 69, 27% increase in FEV1 with BD, TLC 91%, severe airtrapping, DLCO49% On chronic home O2. Pulmonary rehab referral 05/2012    Dyspnea    Fibromyalgia 08/29/2010   Gastroesophageal reflux disease    History of blood transfusion    "several times"  (02/19/2018)   History of clear cell renal cell carcinoma ([place]), in remission 07/21/2011   s/p cryoablation of left RCC in 09/2011 by [name]. Followed by [name]  [facility]) .     History of hiatal hernia    History of mitral valve replacement with bioprosthetic valve due to mitral stenosis 2012   s/p MVR with a 27-mm pericardial porcine valve (Medtronic Mosaic valve, serial [id number] on 09/20/10, Dr. Prescott Gum)    History of obstructive sleep apnea, resolved 2013   resolved per sleep study 07/2019; no apnea, but did have desaturation.  CPAP no longer necessary.  Nocturnal polysomnography (06/2009): Moderate sleep apnea/ hypopnea syndrome , AHI 17.8 per hour with nonpositional hypopneas. CPAP titration  to 12 CWP, AHI 2.4 per hour. On nocturnal CPAP via a small resMed Quattro full-face mask with heated humidifier.    History of pneumonia    "once"  (02/19/2018)   History of seborrheic keratosis 09/28/2015   Hyperlipidemia LDL goal < 100 11/20/2005   Internal and external hemorrhoids without complication [id number]   Lesion of left native kidney 06/01/2020   Incidental finding on recent screening chest CT for lung ca ordered by Dr. Annamaria Boots pulmonology - he has ordered a f/u renal U/S.    Lichen sclerosus of female genitalia 01/12/2017   Migraine    "none in years" (02/19/2018)   Moderately severe major depression ([place]) 11/19/2005   Nocturnal hypoxia per sleep study 07/2019    Osteoporosis    DEXA 2016: T -2.7; DEXA (12/09/2011): L-spine T -3.7, left hip T -1.4 DEXA (12/2004): L-spine T -2.6, left hip -0.1    Paroxysmal atrial fibrillation ([place]) 10/22/2010   s/p Left atrial maze procedure for paroxysmal atrial fibrillation on 09/20/2010 by Dr Prescott Gum.  Subsequent splenic infarct, decision was made to re-anticoagulate with coumadin, likely life-long as this is the most likely cause of the splenic infarct.    Personal history of colonic polyps 05/14/2011   Colonoscopy (05/2011): 4 mm adenomatous polyp excised endoscopically Colonoscopy (02/2002): Adenomatous polyp excised endoscopically    Personal history of renal cell carcinoma 09/12/2020   Pneumonia    Pulmonary hypertension due to chronic obstructive pulmonary disease ([place]) 04/25/2016   2014 TEE w PA peak pressure 46 mmHg, s/p MV replacement    Right nephrolithiasis, asymptomatic, incidental finding 09/06/2014   5 mm non-obstructing calculus seen on CT scan 09/05/2014    Right ventricular failure ([place]) 04/25/2016   Severe obesity (BMI 35.0-39.9) with comorbidity ([place]) 10/23/2011   Sleep apnea    Tobacco abuse 07/28/2012   Type 2 diabetes mellitus with diabetic neuropathy [facility])    Past Surgical History:  Procedure Laterality Date   BIOPSY   07/19/2021   Procedure: BIOPSY;  Surgeon: Clarene Essex, MD;  Location: [facility] END[facility]COPY;  Service: Gastroenterology;;   CARDIAC CATHETERIZATION     CARDIAC VALVE REPLACEMENT  Aug. 2012   "mitral valve"   CAROTID ENDARTERECTOMY Right 07/04/2010   by [name] for asymptomatic right carotid artery stenosis   CATARACT EXTRACTION W/ INTRAOCULAR LENS  IMPLANT, BILATERAL Bilateral  CHEST TUBE INSERTION  09/24/2010   Dr Lucianne Lei Trigt   COLONOSCOPY  05/12/2011   performed by Dr. Michail Sermon. Showing small internal hemorrhoids, single tubular adenoma polyp   COLONOSCOPY WITH PROPOFOL N/A 05/29/2020   Procedure: COLONOSCOPY WITH PROPOFOL;  Surgeon: Clarene Essex, MD;  [facility]: [facility];  Service: Endoscopy;  Laterality: N/A;   COLONOSCOPY WITH PROPOFOL N/A 07/19/2021   Procedure: COLONOSCOPY WITH PROPOFOL;  Surgeon: Clarene Essex, MD;  [facility]: [facility];  Service: Gastroenterology;  Laterality: N/A;   CRYOABLATION Left 09/2011   by [name]. Followed by [name]  [facility]) .     DILATION AND CURETTAGE OF UTERUS     ESOPHAGOGASTRODUODENOSCOPY  05/12/2011   performed by Dr. Michail Sermon. Negative for ulcerations, biopsy negative for evidence of celiac sprue   ESOPHAGOGASTRODUODENOSCOPY N/A 06/29/2015   Procedure: ESOPHAGOGASTRODUODENOSCOPY (EGD);  Surgeon: Clarene Essex, MD;  Location: [facility] ENDOSCOPY;  Service: Endoscopy;  Laterality: N/A;   ESOPHAGOGASTRODUODENOSCOPY N/A 03/29/2016   Procedure: ESOPHAGOGASTRODUODENOSCOPY (EGD);  Surgeon: Clarene Essex, MD;  Location: [facility] ENDOSCOPY;  Service: Endoscopy;  Laterality: N/A;   ESOPHAGOGASTRODUODENOSCOPY (EGD) WITH PROPOFOL N/A 05/29/2020   Procedure: ESOPHAGOGASTRODUODENOSCOPY (EGD) WITH PROPOFOL;  Surgeon: Clarene Essex, MD;  [facility];  Service: Endoscopy;  Laterality: N/A;   FRACTURE SURGERY     GIVENS CAPSULE STUDY N/A 06/30/2015   Procedure: GIVENS CAPSULE STUDY;  Surgeon: Clarene Essex, MD;  Location: [place];  Service: Endoscopy;  Laterality:  N/A;   GIVENS CAPSULE STUDY N/A 06/29/2015   Procedure: GIVENS CAPSULE STUDY;  Surgeon: Clarene Essex, MD;  Location: [place];  Service: Endoscopy;  Laterality: N/A;   HEMORRHOID SURGERY  1970s?   "lanced"   HYSTEROSCOPY W/ ENDOMETRIAL ABLATION  06/2001   for persistent post-menopausal bleeding // by S. [name], M.D.   IR GENERIC HISTORICAL  08/23/2015   IR RADIOLOGIST EVAL & MGMT 08/23/2015 Aletta Edouard, MD [facility] INTERV RAD   IR GENERIC HISTORICAL  04/09/2016   IR RADIOLOGIST EVAL & MGMT 04/09/2016 Aletta Edouard, MD [facility] INTERV RAD   IR RADIOLOGIST EVAL & MGMT  10/07/2016   IR RADIOLOGIST EVAL & MGMT  06/25/2017   IR RADIOLOGIST EVAL & MGMT  07/31/2020   IR RADIOLOGIST EVAL & MGMT  10/02/2020   IR RADIOLOGIST EVAL & MGMT  12/20/2020   IR RADIOLOGIST EVAL & MGMT  12/25/2021   LEFT HEART CATH AND CORONARY ANGIOGRAPHY N/A 04/21/2016   Procedure: Left Heart Cath and Coronary Angiography;  Surgeon: Lorretta Harp, MD;  Location: [place] CV LAB;  Service: Cardiovascular;  Laterality: N/A;   LIPOMA EXCISION  08/2005   occipital lipoma 1.5cm - by Dr. [name]   LITHOTRIPSY  ~ 2000   MAZE Left 09/20/10   for paroxysmal atrial fibrillation (Dr. Prescott Gum)   MITRAL VALVE REPLACEMENT  09/20/10    with a 27-mm pericardial porcine valve (Medtronic Mosaic valve, serial #[id number]). 09/20/10, Dr Prescott Gum   ORIF CLAVICLE FRACTURE Right 01/2004   by Thana Farr. [name], M.D for Right clavicle nonunion.; "it's got a pin in it"   POLYPECTOMY  05/29/2020   Procedure: POLYPECTOMY;  Surgeon: Clarene Essex, MD;  [facility];  Service: Endoscopy;;   POLYPECTOMY  07/19/2021   Procedure: POLYPECTOMY;  Surgeon: Clarene Essex, MD;  [facility];  Service: Gastroenterology;;   RADIOLOGY WITH ANESTHESIA Left 09/12/2020   Procedure: RADIOLOGY WITH ANESTHESIA- RENAL CRYOABLATION;  Surgeon: Aletta Edouard, MD;  [facility];  Service: Radiology;  Laterality: Left;   REFRACTIVE SURGERY  Bilateral     RIGHT HEART CATH N/A 04/23/2016   Procedure: Right Heart Cath;  Surgeon: [name], MD;  Location: [address] CV LAB;  Service: Cardiovascular;  Laterality: N/A;   TONSILLECTOMY     TUBAL LIGATION     Family History  Problem Relation Age of Onset   Peptic Ulcer Disease Father    Heart attack Father 36       Died of MI at age 16   Heart attack Brother 65       Died of MI at age 22   Obesity Brother    Pneumonia Mother    Healthy Sister    Lupus Daughter    Obsessive Compulsive Disorder Daughter    Social History   Socioeconomic History   Marital status: Divorced    Spouse name: Not on file   Number of children: Not on file   Years of education: Not on file   Highest education level: Not on file  Occupational History   Not on file  Tobacco Use   Smoking status: Every Day    Packs/day: 1.00    Years: 55.00    Additional pack years: 0.00    Total pack years: 55.00    Types: Cigarettes    Start date: 11/01/1962   Smokeless tobacco: Never   Tobacco comments:    At least 1 PPD  Vaping Use   Vaping Use: Never used  Substance and Sexual Activity   Alcohol use: Not Currently    Comment: 02/19/2018 "nothing since 1999"   Drug use: Not Currently    Types: Marijuana    Comment: 02/19/2018 "nothing since the 1990s"   Sexual activity: Not Currently    Birth control/protection: Post-menopausal  Other Topics Concern   Not on file  Social History Narrative   Lives alone in [place] ([place])   Worked at [employer] for 18 years   No car   Social Determinants of Radio broadcast assistant Strain: Low Risk  (05/01/2022)   Overall Financial Resource Strain (CARDIA)    Difficulty of Paying Living Expenses: Not hard at all  Food Insecurity: No Food Insecurity (05/01/2022)   Hunger Vital Sign    Worried About Running Out of Food in the Last Year: Never true    [place] in the Last Year: Never true  Transportation Needs: No Transportation Needs  (05/01/2022)   PRAPARE - Hydrologist (Medical): No    Lack of Transportation (Non-Medical): No  Physical Activity: Inactive (05/01/2022)   Exercise Vital Sign    Days of Exercise per Week: 0 days    Minutes of Exercise per Session: 0 min  Stress: Stress Concern Present (05/01/2022)   [place]    Feeling of Stress : Rather much  Social Connections: Socially Isolated (05/01/2022)   Social Connection and Isolation Panel [NHANES]    Frequency of Communication with Friends and Family: More than three times a week    Frequency of Social Gatherings with Friends and Family: More than three times a week    Attends Religious Services: Never    Marine scientist or Organizations: No    Attends Archivist Meetings: Never    Marital Status: Divorced    Tobacco Counseling Ready to quit: Not Answered Counseling given: Not Answered Tobacco comments: At least 1 PPD   Clinical Intake:  Pre-visit preparation completed: Yes  Pain : 0-10 Pain Score: 10-Worst pain ever Pain Type: Chronic pain Pain Location:  (Generalized body pain) Pain Descriptors / Indicators:  (all the above) Pain Onset: More than a month ago Pain Frequency: Constant     Nutritional Risks: None Diabetes: Yes CBG done?: Yes CBG resulted in Enter/ Edit results?: Yes Did pt. bring in CBG monitor from home?: Yes Glucose Meter Downloaded?: Yes  How often do you need to have someone help you when you read instructions, pamphlets, or other written materials from your doctor or pharmacy?: 1 - Never What is the last grade level you completed in school?: 12th grade  Diabetic?Nutrition Risk Assessment:  Has the patient had any N/V/D within the last 2 months?  No  Does the patient have any non-healing wounds?  No  Has the patient had any unintentional weight loss or weight gain?  No   Diabetes:  Is the patient  diabetic?  Yes  If diabetic, was a CBG obtained today?  No  Did the patient bring in their glucometer from home?  No    Financial Strains and Diabetes Management:  Are you having any financial strains with the device, your supplies or your medication? No .  Does the patient want to be seen by Chronic Care Management for management of their diabetes?  No  Would the patient like to be referred to a Nutritionist or for Diabetic Management?  No   Diabetic Exams:  Diabetic Eye Exam: Completed about 1 week ago Dr.Gould Diabetic Foot Exam: Completed 09/19/2021    Interpreter Needed?: No  Information entered by :: Doren Kaspar,cma   Activities of Daily Living    05/01/2022    2:27 PM 05/01/2022   10:29 AM  In your present state of health, do you have any difficulty performing the following activities:  Hearing? 0 0  Vision? 0 0  Difficulty concentrating or making decisions? 1 1  Walking or climbing stairs? 0 0  Dressing or bathing? 0 0  Doing errands, shopping? 0 0    Patient Care Team: Angelica Pou, MD as PCP - General (Internal Medicine) [name], MD as PCP - Advanced Heart Failure (Cardiology) Plyler, Chauncey Reading, RD as Dietitian (Dietician) Clent Jacks, MD as Consulting Physician (Ophthalmology) [name], MD (Inactive) as Consulting Physician (Gastroenterology) ([place]ounding), Imts Vanita Ingles, MD as Attending Physician Deirdre Peer, LCSW as Social Worker  Indicate any recent Medical Services you may have received from other than [facility] providers in the past year (date may be approximate).     Assessment:   This is a routine wellness examination for Hanne.  Hearing/Vision screen No results found.  Dietary issues and exercise activities discussed:     Goals Addressed   None   Depression Screen    05/01/2022    2:27 PM 01/16/2022   12:23 PM 01/15/2022    1:50 PM 11/07/2021   11:44 AM 09/19/2021   11:55 AM 08/22/2021   11:16 AM 08/01/2021   11:04 AM   PHQ 2/9 Scores  PHQ - 2 Score 0 6 6 0 6 0 0  PHQ- 9 Score  19 14  17       Fall Risk    05/01/2022    2:27 PM 05/01/2022   10:28 AM 02/13/2022   11:05 AM 01/30/2022   12:07 PM 01/23/2022   12:12 PM  Fall Risk   Falls in the past year? 1 1 1 1 1   Number falls in past yr: 1 1  1 1 1   Injury with Fall? 1 1 1 1 1   Risk for fall due to : History of fall(s);Impaired balance/gait  History of fall(s);Impaired balance/gait Impaired balance/gait Impaired balance/gait  Follow up Falls evaluation completed;Falls prevention discussed Falls evaluation completed Falls evaluation completed Falls evaluation completed Falls evaluation completed    FALL RISK PREVENTION PERTAINING TO THE HOME:  Any stairs in or around the home? No  If so, are there any without handrails? No  Home free of loose throw rugs in walkways, pet beds, electrical cords, etc? Yes  Adequate lighting in your home to reduce risk of falls? Yes   ASSISTIVE DEVICES UTILIZED TO PREVENT FALLS:  Life alert? No  Use of a cane, walker or w/c? No  Grab bars in the bathroom? Yes  Shower chair or bench in shower? Yes  Elevated toilet seat or a handicapped toilet? Yes   TIMED UP AND GO:  Was the test performed? No .  Length of time to ambulate 10 feet: 0 sec.   Gait unsteady without use of assistive device, provider informed and interventions were implemented  Cognitive Function:        05/01/2022    2:27 PM  6CIT Screen  What Year? 0 points  What month? 0 points  What time? 0 points  Count back from 20 0 points  Months in reverse 0 points  Repeat phrase 0 points  Total Score 0 points    Immunizations Immunization History  Administered Date(s) Administered   Fluad Quad(high Dose 65+) 11/08/2020, 11/07/2021   Influenza Split 11/04/2010, 11/25/2011   Influenza Whole 11/13/2006, 12/27/2007, 10/23/2008, 11/23/2009   Influenza,inj,Quad PF,6+ Mos 10/06/2012, 10/07/2013, 10/06/2014, 11/16/2015, 11/05/2016, 10/07/2017,  11/15/2018, 10/13/2019   Moderna Sars-Covid-2 Vaccination 04/20/2019, 05/18/2019, 03/01/2020, 09/04/2020   Pfizer Covid-19 Vaccine Bivalent Booster 53yrs & up 01/10/2021   Pneumococcal Conjugate-13 02/17/2014   Pneumococcal Polysaccharide-23 03/26/2009, 08/08/2015   Td 10/19/2006   Tdap 05/16/2016   Zoster Recombinat (Shingrix) 08/30/2020, 11/29/2020   Zoster, Live 12/03/2011    TDAP status: Up to date  Flu Vaccine status: Up to date  Pneumococcal vaccine status: Up to date  Covid-19 vaccine status: Completed vaccines  Qualifies for Shingles Vaccine? Yes   Zostavax completed No   Shingrix Completed?: Yes  Screening Tests Health Maintenance  Topic Date Due   COVID-19 Vaccine (6 - 2023-24 season) 10/11/2021   OPHTHALMOLOGY EXAM  03/05/2022   HEMOGLOBIN A1C  08/01/2022   Lung Cancer Screening  08/17/2022   FOOT EXAM  09/20/2022   Diabetic kidney evaluation - Urine ACR  11/08/2022   MAMMOGRAM  01/11/2023   LIPID PANEL  01/19/2023   Diabetic kidney evaluation - eGFR measurement  01/31/2023   Medicare Annual Wellness (AWV)  05/01/2023   DTaP/Tdap/Td (3 - Td or Tdap) 05/17/2026   Pneumonia Vaccine 44+ Years old  Completed   INFLUENZA VACCINE  Completed   DEXA SCAN  Completed   Hepatitis C Screening  Completed   Zoster Vaccines- Shingrix  Completed   HPV VACCINES  Aged Out   COLONOSCOPY (Pts 45-54yrs Insurance coverage will need to be confirmed)  Discontinued    Health Maintenance  Health Maintenance Due  Topic Date Due   COVID-19 Vaccine (6 - 2023-24 season) 10/11/2021   OPHTHALMOLOGY EXAM  03/05/2022    Colorectal cancer screening: Type of screening: Colonoscopy. Completed 07/19/2021. Repeat every 0 years  Mammogram status: Completed 01/10/2021. Repeat every year:2  Bone Density status: Completed 06/18/2021. Results reflect: Bone density results: OSTEOPOROSIS. Repeat  every 0 years.  Lung Cancer Screening: (Low Dose CT Chest recommended if Age 9-80 years, 30  pack-year currently smoking OR have quit w/in 15years.) does not qualify.   Lung Cancer Screening Referral: N/A  Additional Screening:  Hepatitis C Screening: does not qualify; Completed 10/06/2014  Vision Screening: Recommended annual ophthalmology exams for early detection of glaucoma and other disorders of the eye. Is the patient up to date with their annual eye exam?  Yes  Who is the provider or what is the name of the office in which the patient attends annual eye exams? [facility] If pt is not established with a provider, would they like to be referred to a provider to establish care? No .   Dental Screening: Recommended annual dental exams for proper oral hygiene  Community Resource Referral / Chronic Care Management: CRR required this visit?  No   CCM required this visit?  No      Plan:     I have personally reviewed and noted the following in the patient's chart:   Medical and social history Use of alcohol, tobacco or illicit drugs  Current medications and supplements including opioid prescriptions. Patient is currently taking opioid prescriptions. Information provided to patient regarding non-opioid alternatives. Patient advised to discuss non-opioid treatment plan with their provider. Functional ability and status Nutritional status Physical activity Advanced directives List of other physicians Hospitalizations, surgeries, and ER visits in previous 12 months Vitals Screenings to include cognitive, depression, and falls Referrals and appointments  In addition, I have reviewed and discussed with patient certain preventive protocols, quality metrics, and best practice recommendations. A written personalized care plan for preventive services as well as general preventive health recommendations were provided to patient.     Kerin Perna, [facility]   05/01/2022   Nurse Notes: Face-To-Face Visit  Ms. Mctier , Thank you for taking time to come for your Medicare  Wellness Visit. I appreciate your ongoing commitment to your health goals. Please review the following plan we discussed and let me know if I can assist you in the future.   These are the goals we discussed:  Goals      Provide support, resources and mental health support     Activities and task to complete in order to accomplish goals.    Complete Aging Gracefully paperwork and send in! Continue with home physical therapy  To determine if pt is eligible/in need of SNF rehab -vs- continued [place]!        This is a list of the screening recommended for you and due dates:  Health Maintenance  Topic Date Due   COVID-19 Vaccine (6 - 2023-24 season) 10/11/2021   Eye exam for diabetics  03/05/2022   Hemoglobin A1C  08/01/2022   Screening for Lung Cancer  08/17/2022   Complete foot exam   09/20/2022   Yearly kidney health urinalysis for diabetes  11/08/2022   Mammogram  01/11/2023   Lipid (cholesterol) test  01/19/2023   Yearly kidney function blood test for diabetes  01/31/2023   Medicare Annual Wellness Visit  05/01/2023   DTaP/Tdap/Td vaccine (3 - Td or Tdap) 05/17/2026   Pneumonia Vaccine  Completed   Flu Shot  Completed   DEXA scan (bone density measurement)  Completed   Hepatitis C Screening: USPSTF Recommendation to screen - Ages 41-79 yo.  Completed   Zoster (Shingles) Vaccine  Completed   HPV Vaccine  Aged Out   Colon Cancer Screening  Discontinued

## 2022-05-02 ENCOUNTER — Ambulatory Visit: Payer: Self-pay | Admitting: *Deleted

## 2022-05-02 NOTE — Patient Outreach (Signed)
  Care Coordination   Follow Up Visit Note   05/02/2022 Name: [name] MRN: [id number] DOB: 1950/02/22  [name] is a 72 y.o. year old female who sees [name], [name], MD for primary care. I spoke with  [name] by phone today.  What matters to the patients health and wellness today?  Physical therapy visiting at time of call-     Goals Addressed             This Visit's Progress    Provide support, resources and mental health support       Activities and task to complete in order to accomplish goals.    Complete Aging Gracefully paperwork and send in or deliver to office asap! Continue with home physical therapy          SDOH assessments and interventions completed:  Yes     Care Coordination Interventions:  Yes, provided  Interventions Today    Flowsheet Row Most Recent Value  Chronic Disease   Chronic disease during today's visit Congestive Heart Failure (CHF)  General Interventions   General Interventions Discussed/Reviewed [employer]  [Stressed to pt to complete paperwork for Aging Gracefully program for hopeful in-home support/services]  Exercise Interventions   Physical Activity Discussed/Reviewed Physical Activity Discussed, Types of exercise  [HHPT continues- spoke with [place], PT, while with pt today- reports pt needs some home adaptations to enhance and prevent falls.  Reminded pt that Aging Gracefully may be able to help with this.]       Follow up plan: Follow up call scheduled for 05/06/22    Encounter Outcome:  Pt. Visit Completed

## 2022-05-02 NOTE — Patient Instructions (Signed)
Visit Information  Thank you for taking time to visit with me today. Please don't hesitate to contact me if I can be of assistance to you.   Following are the goals we discussed today:   Goals Addressed             This Visit's Progress    Provide support, resources and mental health support       Activities and task to complete in order to accomplish goals.    Complete Aging Gracefully paperwork and send in or deliver to office asap! Continue with home physical therapy          Our next appointment is by telephone on 05/06/22   Please call the care guide team at [phone] if you need to cancel or reschedule your appointment.   If you are experiencing a Mental Health or [place] or need someone to talk to, please call the Suicide and Crisis Lifeline: 988 call 911   The patient verbalized understanding of instructions, educational materials, and care plan provided today and DECLINED offer to receive copy of patient instructions, educational materials, and care plan.   Telephone follow up appointment with care management team member scheduled for:05/06/22 The patient will complete AGING GRACEFULLY paperwork as advised.  Eduard Clos, MSW, [place] Worker [employer] [employer] [phone]

## 2022-05-05 ENCOUNTER — Telehealth: Payer: Self-pay | Admitting: *Deleted

## 2022-05-05 LAB — PROTIME-INR: INR: 3.2 — AB (ref 0.80–1.20)

## 2022-05-05 NOTE — Telephone Encounter (Signed)
Call from patient wanted to let Dr. [name] know that she had fallen and hit her head again, but on a pillow.  Has placed pillows in floor to break her falls now. Called EMS was checked by them. No injuries and told she did not need to come to the ER.  States feels ok and no headaches.  Dr. [name] was informed of patient's latest fall.

## 2022-05-05 NOTE — Telephone Encounter (Signed)
Patient reports PST FS POC INR result 3.2 (target range 2.0 - 2.5) on 15 milligrams warfarin/wk. REDUCED to 11.25 milligrams warfarin/wk as: 1/2 x 5mg  (2.5 mg dose) on Tue/Thur/Sat; 1x5mg  dose on Wed/Fri/Sun. Repeat INR 1-APR-24. No bleeding reported.

## 2022-05-05 NOTE — Telephone Encounter (Signed)
Thank you for letting me know.  Glad she's ok.

## 2022-05-06 ENCOUNTER — Ambulatory Visit: Payer: Self-pay | Admitting: *Deleted

## 2022-05-06 NOTE — Patient Instructions (Signed)
Visit Information  Thank you for taking time to visit with me today. Please don't hesitate to contact me if I can be of assistance to you.   Following are the goals we discussed today:   Goals Addressed             This Visit's Progress    Provide support, resources and mental health support       Activities and task to complete in order to accomplish goals.    Complete Aging Gracefully paperwork and send in or deliver to office asap! Continue with home physical therapy  Continue to consider completing Medicaid application          Our next appointment is by telephone on 05/19/22    Please call the care guide team at [phone] if you need to cancel or reschedule your appointment.   If you are experiencing a Mental Health or [place] or need someone to talk to, please call the Suicide and Crisis Lifeline: 988 call 911   The patient verbalized understanding of instructions, educational materials, and care plan provided today and DECLINED offer to receive copy of patient instructions, educational materials, and care plan.   Telephone follow up appointment with care management team member scheduled for: 05/19/22  Eduard Clos, MSW, [place] Worker [employer] [employer] [facility] [phone]

## 2022-05-06 NOTE — Patient Outreach (Signed)
  Care Coordination   Follow Up Visit Note   05/06/2022 Name: [name] MRN: [id number] DOB: [birth year]  [name] is a 72 y.o. year old female who sees [name], [name], MD for primary care. I spoke with  [name] by phone today.  What matters to the patients health and wellness today?  Plans to print bank statements to submit with Aging Gracefully application.    Goals Addressed             This Visit's Progress    Provide support, resources and mental health support       Activities and task to complete in order to accomplish goals.    Complete Aging Gracefully paperwork and send in or deliver to office asap! Continue with home physical therapy  Continue to consider completing Medicaid application          SDOH assessments and interventions completed:  Yes     Care Coordination Interventions:  Yes, provided   Follow up plan: Follow up call scheduled for 05/19/22    Encounter Outcome:  Pt. Visit Completed

## 2022-05-08 ENCOUNTER — Telehealth: Payer: Self-pay | Admitting: *Deleted

## 2022-05-08 NOTE — Patient Instructions (Signed)
Visit Information  Thank you for taking time to visit with me today. Please don't hesitate to contact me if I can be of assistance to you.   Following are the goals we discussed today:   Goals Addressed             This Visit's Progress    Provide support, resources and mental health support       Activities and task to complete in order to accomplish goals.    Complete Aging Gracefully (extension received for 15 more days)paperwork and send in or deliver to office asap! Continue with home physical therapy  Continue to consider completing Medicaid application Consider participating in activities outside the home- [facility],etc CSW inquiring with [employer] about your application and will update you         Our next appointment is by telephone on 05/16/22   Please call the care guide team at [phone] if you need to cancel or reschedule your appointment.   If you are experiencing a Mental Health or [place] or need someone to talk to, please call the Suicide and Crisis Lifeline: 988 call 911   The patient verbalized understanding of instructions, educational materials, and care plan provided today and DECLINED offer to receive copy of patient instructions, educational materials, and care plan.   Telephone follow up appointment with care management team member scheduled for: 05/16/22  Eduard Clos, MSW, [place] Worker [employer] [employer] [facility] [phone]

## 2022-05-08 NOTE — Telephone Encounter (Signed)
Call from [place] PT with [facility] who is currently in the home; calling to give pt's doctor a report. She states VSS - BP 148/52; BS 270 this am but normal during the night per pt; Pain 8 out of 10; pt states SOB earlier but not as bad now; hard BM's x 2 days; pt has taken her meds this am. Thanks

## 2022-05-08 NOTE — Patient Outreach (Signed)
  Care Coordination   Follow Up Visit Note   05/08/2022 Name: [name] MRN: [id number] DOB: 1950-08-22  [name] is a 72 y.o. year old female who sees [name], [name], MD for primary care. I spoke with  [name] by phone today.  What matters to the patients health and wellness today?  "Got a 15 day extension on Aging Gracefully paperwork/application".     Goals Addressed             This Visit's Progress    Provide support, resources and mental health support       Activities and task to complete in order to accomplish goals.    Complete Aging Gracefully (extension received for 15 more days)paperwork and send in or deliver to office asap! Continue with home physical therapy  Continue to consider completing Medicaid application Consider participating in activities outside the home- [facility],etc CSW inquiring with [employer] about your application and will update you         SDOH assessments and interventions completed:  Yes     Care Coordination Interventions:  Yes, provided  Interventions Today    Flowsheet Row Most Recent Value  Chronic Disease   Chronic disease during today's visit Congestive Heart Failure (CHF)  General Interventions   General Interventions Discussed/Reviewed [employer] application update pending. Discussed seeking hobbies/activities at [employer]. Complete and submit Aging Gracefully application.]  Mental Health Interventions   Mental Health Discussed/Reviewed Mental Health Discussed, Coping Strategies, Depression  [Pt acknowledges her sleeping patterns ("sleep too much") and  it's correlation to depression symptoms- increasing her activity to include socialization/activity outside the home may be beneficial in helping to reduce symptoms/depression.]       Follow up plan:  4 /5/24   Encounter Outcome:  Pt. Visit Completed

## 2022-05-12 DIAGNOSIS — Z7901 Long term (current) use of anticoagulants: Secondary | ICD-10-CM | POA: Diagnosis not present

## 2022-05-12 DIAGNOSIS — R2689 Other abnormalities of gait and mobility: Secondary | ICD-10-CM | POA: Diagnosis not present

## 2022-05-12 DIAGNOSIS — I480 Paroxysmal atrial fibrillation: Secondary | ICD-10-CM | POA: Diagnosis not present

## 2022-05-12 DIAGNOSIS — G4733 Obstructive sleep apnea (adult) (pediatric): Secondary | ICD-10-CM | POA: Diagnosis not present

## 2022-05-12 DIAGNOSIS — G3184 Mild cognitive impairment, so stated: Secondary | ICD-10-CM | POA: Diagnosis not present

## 2022-05-12 DIAGNOSIS — Z794 Long term (current) use of insulin: Secondary | ICD-10-CM | POA: Diagnosis not present

## 2022-05-12 DIAGNOSIS — Z7985 Long-term (current) use of injectable non-insulin antidiabetic drugs: Secondary | ICD-10-CM | POA: Diagnosis not present

## 2022-05-12 DIAGNOSIS — E1151 Type 2 diabetes mellitus with diabetic peripheral angiopathy without gangrene: Secondary | ICD-10-CM | POA: Diagnosis not present

## 2022-05-12 DIAGNOSIS — I739 Peripheral vascular disease, unspecified: Secondary | ICD-10-CM | POA: Diagnosis not present

## 2022-05-12 DIAGNOSIS — E1140 Type 2 diabetes mellitus with diabetic neuropathy, unspecified: Secondary | ICD-10-CM | POA: Diagnosis not present

## 2022-05-12 DIAGNOSIS — D649 Anemia, unspecified: Secondary | ICD-10-CM | POA: Diagnosis not present

## 2022-05-13 ENCOUNTER — Telehealth: Payer: Self-pay | Admitting: *Deleted

## 2022-05-13 NOTE — Assessment & Plan Note (Addendum)
Lungs clearer than usual (chronic rhonchi are common), no JVD, no LE edema.  Dyspnea is multifactorial but largely due to COPD. No changes, monitor.

## 2022-05-13 NOTE — Assessment & Plan Note (Addendum)
A1c increased to 7.5 on Tresiba 49 units daily and Ozempic daily.  Endorses poor diet.  Increase Ozempic to 1 mg weekly.

## 2022-05-13 NOTE — Assessment & Plan Note (Signed)
Feet chronically cool, dusky, pulseless - as they are today.  No foot wounds. All but the great toenails were trimmed, which will be referred to podiatrist.

## 2022-05-13 NOTE — Assessment & Plan Note (Signed)
Continues to have fragmented non-restorative sleep and reports sleeping most of the day.  Medications are suspected as sedating, particularly if they aren't taken as prescribed (as in the occasion of forgetting last doses and exceeding dosing unintentionally).  With her hx of COPD I will need to consider possibility of CO2 retention.

## 2022-05-13 NOTE — Telephone Encounter (Signed)
Received INR result from [facility]. 05/12/22 - INR 1.9. Thanks

## 2022-05-13 NOTE — Assessment & Plan Note (Signed)
BP controlled at 127/51 on home regimen.  No changes, monitor.

## 2022-05-13 NOTE — Assessment & Plan Note (Signed)
Continues close monitoring in Dr. Gladstone Pih coumadin clinic.  Fortunately no bleeding events despite numerous falls (several from seated position or out of bed).  No changes, monitor.

## 2022-05-13 NOTE — Assessment & Plan Note (Signed)
No clinical manifestations (no JVD or LE edema).  Continue to monitor.

## 2022-05-13 NOTE — Assessment & Plan Note (Signed)
>>  ASSESSMENT AND PLAN FOR CHRONIC CHF (CONGESTIVE HEART FAILURE) (HCC) WRITTEN ON 05/18/2022  2:58 PM BY Miguel Aschoff, MD  Lungs clearer than usual (chronic rhonchi are common), no JVD, no LE edema.  Dyspnea is multifactorial but largely due to COPD. No changes, monitor.

## 2022-05-14 ENCOUNTER — Other Ambulatory Visit: Payer: Self-pay | Admitting: Internal Medicine

## 2022-05-14 DIAGNOSIS — G4733 Obstructive sleep apnea (adult) (pediatric): Secondary | ICD-10-CM

## 2022-05-15 ENCOUNTER — Telehealth: Payer: Self-pay | Admitting: *Deleted

## 2022-05-15 ENCOUNTER — Encounter: Payer: HMO | Admitting: Internal Medicine

## 2022-05-15 NOTE — Telephone Encounter (Signed)
Received call from [place], [place] with [facility]. Patient is receiving HH PT through [place]. States they received a fax from our office requesting [place]. [facility] does not have Aides. Patient has MCD so will qualify for PCS. Will forward to [facility] Coordinator to prepare paperwork for PCP to complete.

## 2022-05-16 ENCOUNTER — Ambulatory Visit: Payer: Self-pay | Admitting: *Deleted

## 2022-05-16 NOTE — Patient Instructions (Signed)
Visit Information  Thank you for taking time to visit with me today. Please don't hesitate to contact me if I can be of assistance to you.   Following are the goals we discussed today:   Goals Addressed             This Visit's Progress    Provide support, resources and mental health support       Activities and task to complete in order to accomplish goals.    Return Aging Gracefully paperwork asap or email to me and I will assist Continue with  HH OT and OT  Continue to consider completing Medicaid application Consider participating in activities outside the home- [facility],etc CSW inquiring with [employer] about your application and will update you         Our next appointment is by telephone on 05/19/22   Please call the care guide team at [phone] if you need to cancel or reschedule your appointment.   If you are experiencing a Mental Health or Behavioral Health Crisis or need someone to talk to, please call the Suicide and Crisis Lifeline: 988 call 911   The patient verbalized understanding of instructions, educational materials, and care plan provided today and DECLINED offer to receive copy of patient instructions, educational materials, and care plan.   Telephone follow up appointment with care management team member scheduled for:06/08/22  Reece Levy, MSW, LCSW Clinical Social Worker [employer] [employer] [facility] [phone]

## 2022-05-16 NOTE — Patient Outreach (Signed)
  Care Coordination   Follow Up Visit Note   05/16/2022 Name: [name] MRN: [id number] DOB: 01-14-1951  [name] is a 72 y.o. year old female who sees [name], [name], MD for primary care. I spoke with  [name] by phone today.  What matters to the patients health and wellness today?  Just finished HHOT session     Goals Addressed             This Visit's Progress    Provide support, resources and mental health support       Activities and task to complete in order to accomplish goals.    Return Aging Gracefully paperwork asap or email to me and I will assist Continue with  HH OT and OT  Continue to consider completing Medicaid application Consider participating in activities outside the home- [facility],etc CSW inquiring with [employer] about your application and will update you         SDOH assessments and interventions completed:  Yes     Care Coordination Interventions:  Yes, provided  Interventions Today    Flowsheet Row Most Recent Value  Chronic Disease   Chronic disease during today's visit Congestive Heart Failure (CHF)  General Interventions   General Interventions Discussed/Reviewed Community Resources       Follow up plan: Follow up call scheduled for 05/19/22    Encounter Outcome:  Pt. Visit Completed

## 2022-05-18 IMAGING — MG DIGITAL SCREENING BILAT W/ TOMO W/ CAD
6 of 10 series · 6 of 30 positions shown · non-contrast
Comparison: Previous exam(s).

CLINICAL DATA: Screening.

EXAM:
DIGITAL SCREENING BILATERAL MAMMOGRAM WITH TOMO AND CAD

[R MLO synth-2D]
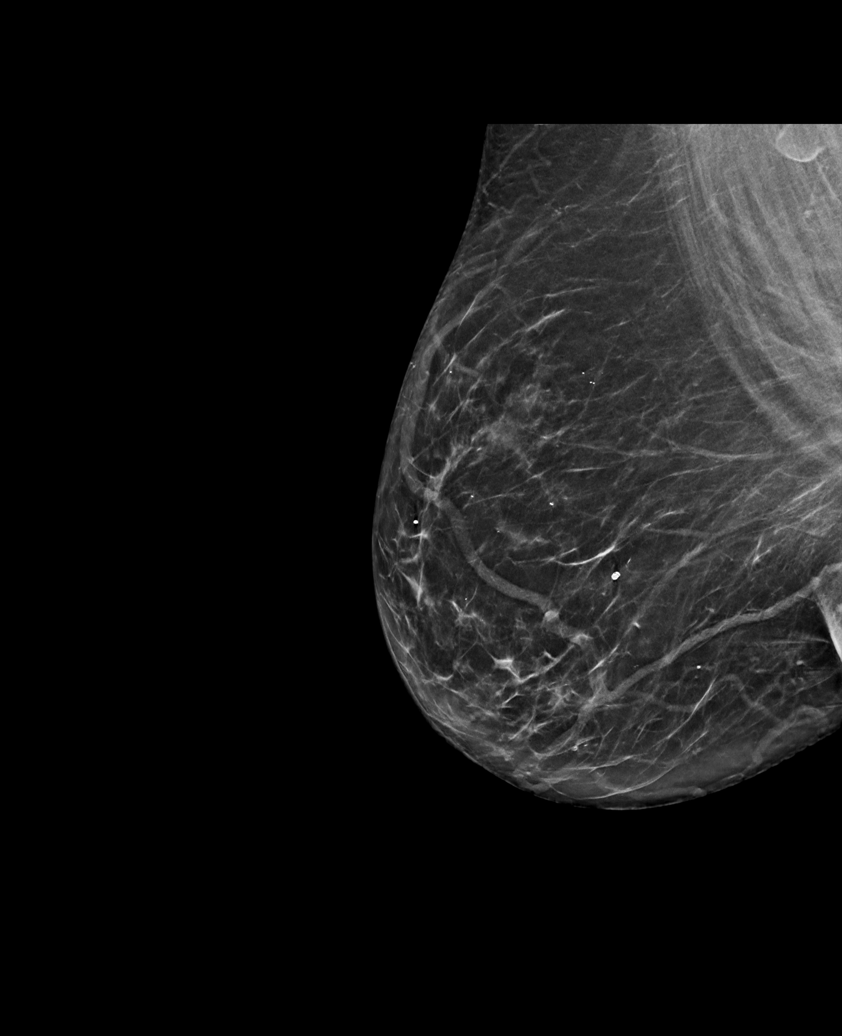

[L MLO synth-2D (1 of 2)]
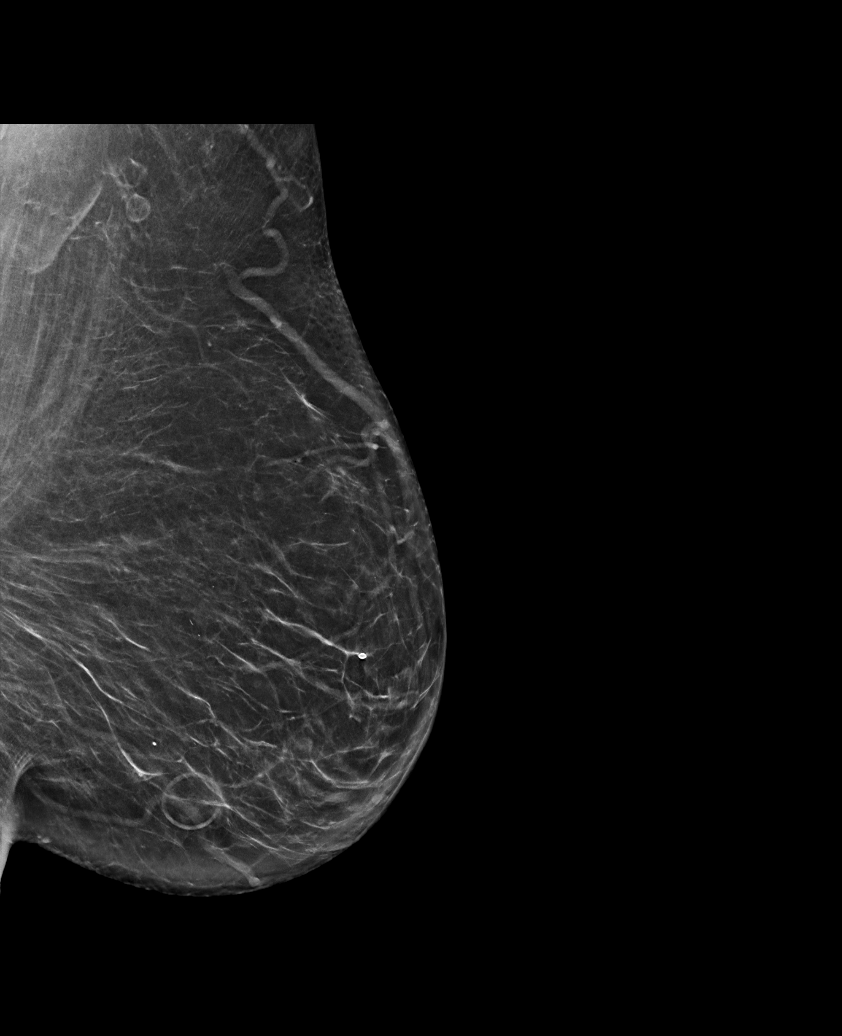

[L MLO synth-2D (2 of 2)]
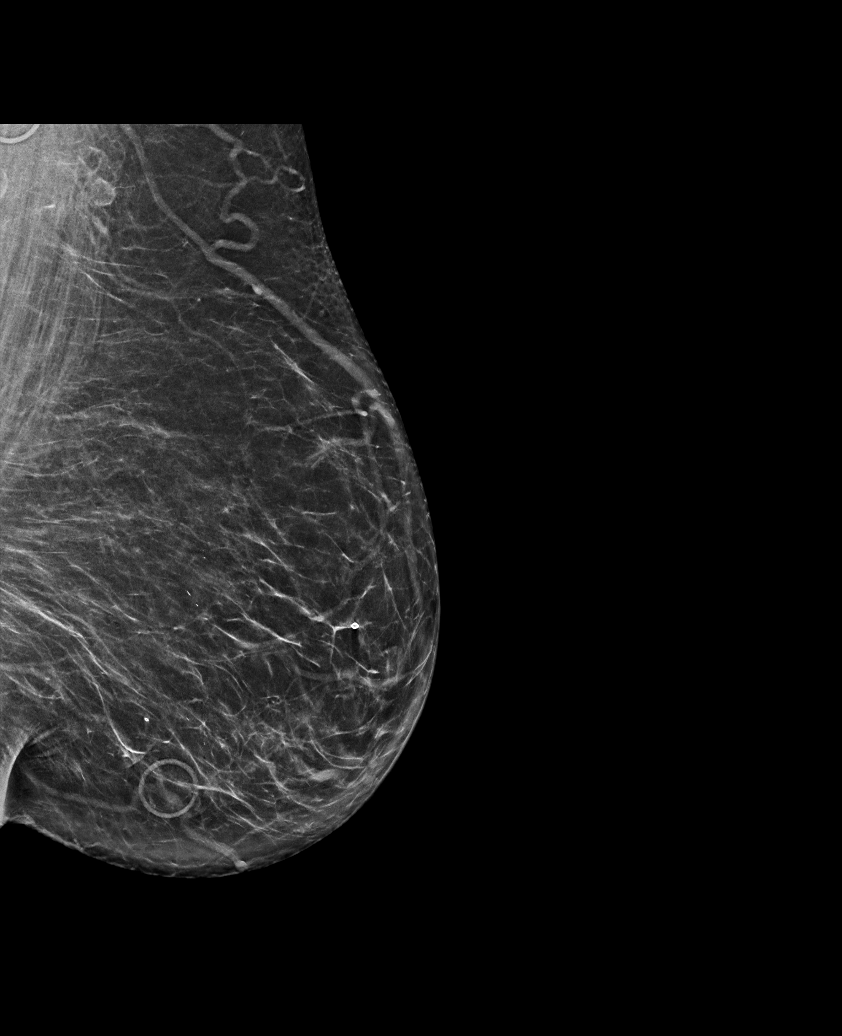

[R CC synth-2D]
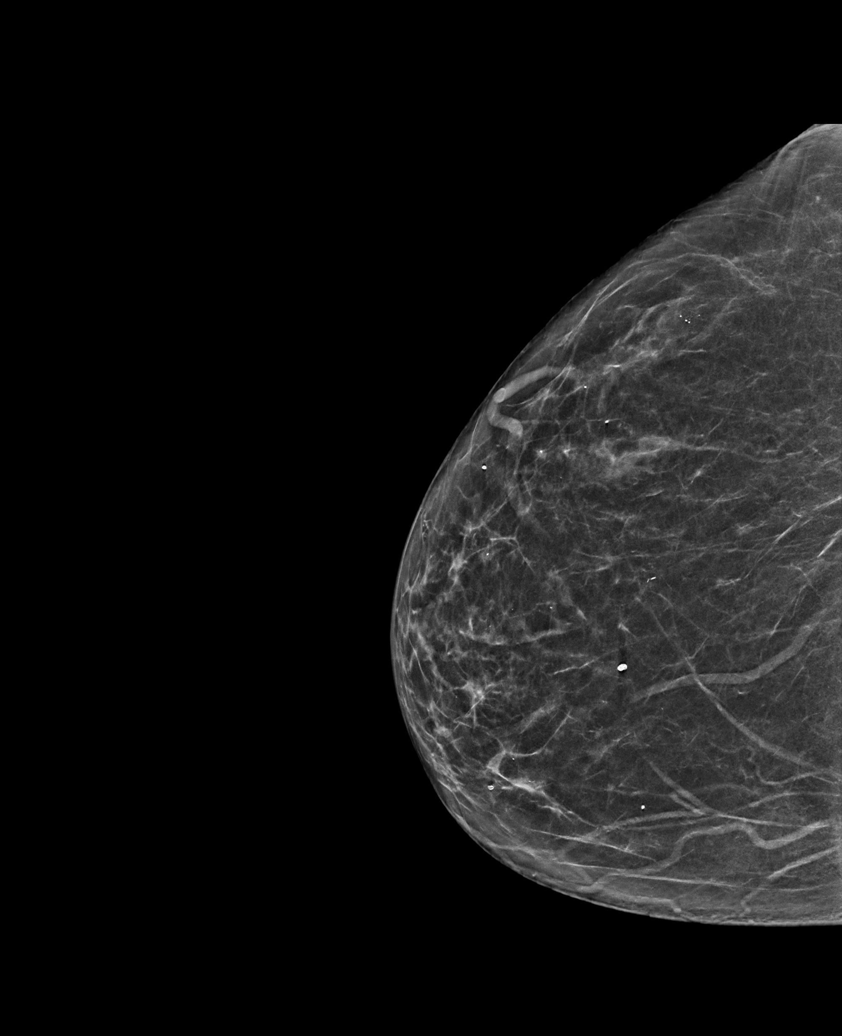

[L CC synth-2D]
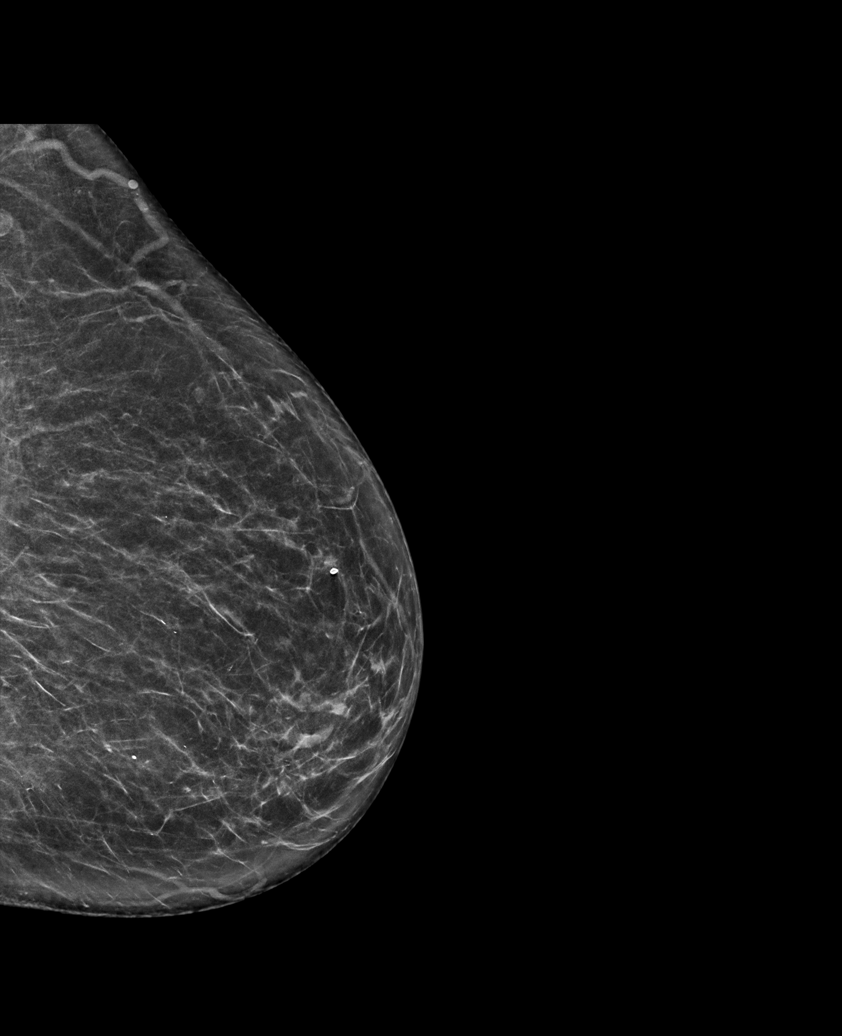

[L MLO tomo · tomo slice 35/69.0]
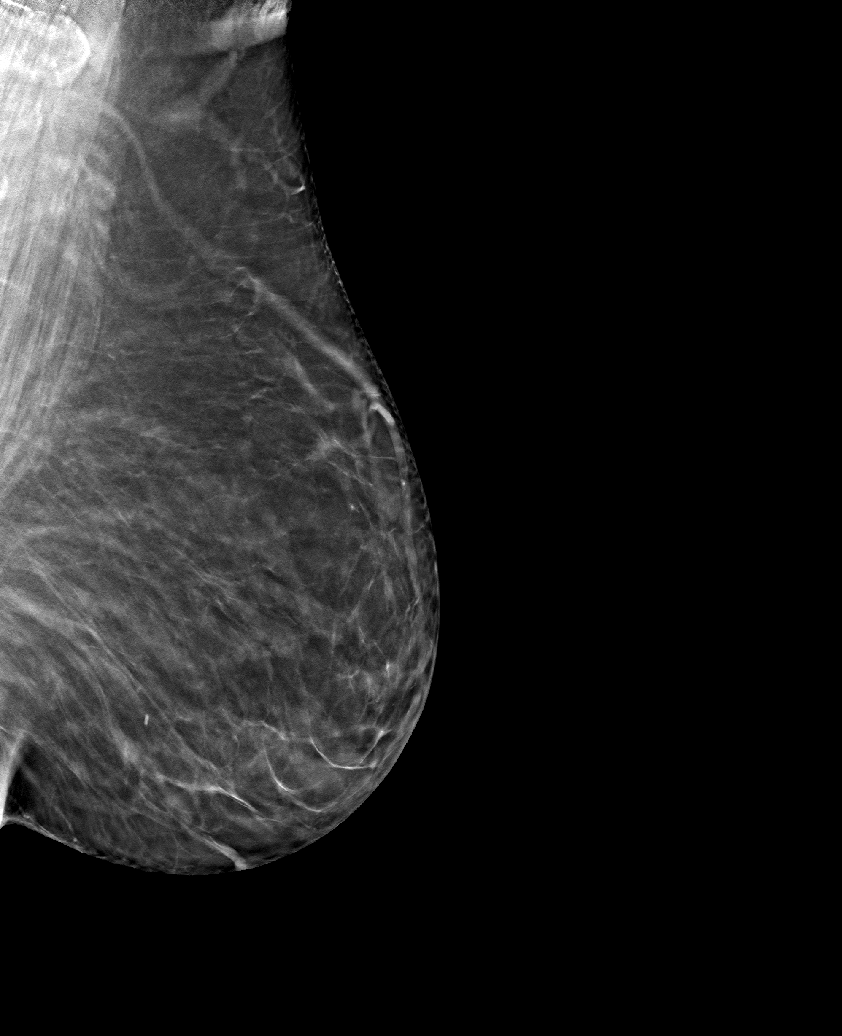

[6 of 30 positions shown; findings below may reference images not displayed]

ACR Breast Density Category b: There are scattered areas of
fibroglandular density.
FINDINGS: There are no findings suspicious for malignancy. Images were
processed with CAD.
IMPRESSION: No mammographic evidence of malignancy. A result letter of this
screening mammogram will be mailed directly to the patient.

RECOMMENDATION:
Screening mammogram in one year. (Code:[id number])

BI-RADS CATEGORY  1: Negative.

## 2022-05-18 NOTE — Assessment & Plan Note (Signed)
Having difficulty with current DME provider and requests referral to alternative for 02- referred to [facility].

## 2022-05-18 NOTE — Assessment & Plan Note (Signed)
Did not qualify for SNF for short term rehab, though HHPT continues in effort to optimize gait and minimize fall risk.  She is not thriving.  We are exploring HHA/PCS services if she qualifies.  She does need 24 supervision particularly given fall risk, and to ensure no errors in medication regimen (confusion, poor sleep/wake cycle, oversedation all pose high risk for unintended mistakes ). ALF is most appropriate but is currently not financially feasible without selling the home, something she is not yet ready to do.  Ongoing challenge.

## 2022-05-18 NOTE — Assessment & Plan Note (Signed)
Again no time at this visit to more formally check her cognitive function.  She would probably need an appointment specifically for this.  My concern remains polypharmacy with a number of centrally active meds contributing to her memory loss and confusion, and her underlying depression could contribute as well.  Without stopping/minimizing the meds, however, it will be very difficult to elucidate whether she has underlying neurodegenerative conditio.

## 2022-05-18 NOTE — Assessment & Plan Note (Signed)
No exacerbations, continues prescribed inhalers. Following with Dr. Maple [facility].

## 2022-05-19 ENCOUNTER — Ambulatory Visit: Payer: Self-pay | Admitting: *Deleted

## 2022-05-19 ENCOUNTER — Other Ambulatory Visit (HOSPITAL_COMMUNITY): Payer: Self-pay

## 2022-05-19 ENCOUNTER — Encounter: Payer: Self-pay | Admitting: Internal Medicine

## 2022-05-19 ENCOUNTER — Other Ambulatory Visit: Payer: Self-pay

## 2022-05-19 DIAGNOSIS — I480 Paroxysmal atrial fibrillation: Secondary | ICD-10-CM | POA: Diagnosis not present

## 2022-05-19 DIAGNOSIS — Z7901 Long term (current) use of anticoagulants: Secondary | ICD-10-CM | POA: Diagnosis not present

## 2022-05-19 NOTE — Telephone Encounter (Signed)
Patient texts results of PST FS POC INR value for today = 2.3 on 1x2.5mg  Su/Mo/We/Fr; 1/2 x 2.5mg  (1.25mg ) on Tu/Th/Sa. Advised to CONTINUE SAME REGIMEN. Repeat PST INR 15APR24.No bleeding, no new medications, no missed doses.

## 2022-05-19 NOTE — Patient Outreach (Signed)
  Care Coordination   Follow Up Visit Note   05/19/2022 Name: [name] MRN: [id number] DOB: 1950/11/06  [name] is a 72 y.o. year old female who sees [name], [name], MD for primary care. I spoke with  [name] by phone today.  What matters to the patients health and wellness today?  "I fell getting out of bed this morning".  Was able to get up and pt reports her bad is too high [facility] Aging Gracefully may have some assistance to offer with home assessment/adaptations)     Goals Addressed             This Visit's Progress    Provide support, resources and mental health support       Activities and task to complete in order to accomplish goals.    Await call from Aging Gracefully-glad you completed and dropped off the paperwork Continue with  [facility] OT and OT  Continue to consider completing Medicaid application Consider participating in activities outside the home- [facility],etc CSW inquiring with [employer] about your application and will update you         SDOH assessments and interventions completed:  Yes     Care Coordination Interventions:  Yes, provided  Interventions Today    Flowsheet Row Most Recent Value  General Interventions   General Interventions Discussed/Reviewed [employer] Gracefully application completed and dropped off per pt]  Exercise Interventions   Exercise Discussed/Reviewed Exercise Discussed  [continue with HHPT and OT]  Mental Health Interventions   Mental Health Discussed/Reviewed Coping Strategies       Follow up plan: Follow up call scheduled for 05/21/22    Encounter Outcome:  Pt. Visit Completed

## 2022-05-19 NOTE — Telephone Encounter (Signed)
Notified Erica with [facility] that Dr. [name] has read her note.

## 2022-05-20 ENCOUNTER — Telehealth: Payer: Self-pay | Admitting: *Deleted

## 2022-05-20 NOTE — Telephone Encounter (Signed)
Called patient regarding her PCS form, faxed to Mosie Lukes / [facility] for review and scheduling for assessment/ [phone]  fax -[phone]. Left voice message for patient to inform her that medicaid will be contacting her to set up the appointment.

## 2022-05-21 ENCOUNTER — Telehealth: Payer: Self-pay | Admitting: Student

## 2022-05-21 ENCOUNTER — Telehealth: Payer: Self-pay

## 2022-05-21 ENCOUNTER — Ambulatory Visit: Payer: Self-pay | Admitting: *Deleted

## 2022-05-21 NOTE — Telephone Encounter (Signed)
3:30 AM received paged to call patient back.  She reports nocturnal leg cramp that has been bothering her for the last few weeks.  They usually last 20 minutes.  She denies any leg swelling or erythema.  She report adherence to her Lasix and potassium supplementation.  She denies any recent medication changes.  Hypokalemia is most likely cause of her nocturnal leg cramp.  Her inhaled LABA and benzodiazepine also put her at a higher risk for this condition.  Patient has a follow-up appointment with Dr. [name] on 4/23.  Will ask front desk to find her an earlier appointment.  She will need a BMP to check potassium level.  In the meantime advised patient on calf stretching exercise or heat.  Vitamin B complex and vitamin E have limited data on nocturnal leg cramp, which she can try.  At the end of the conversation, patient said that she really just needed somebody to talk to because she lives by herself.  Consider screening for depression at next visit.

## 2022-05-21 NOTE — Telephone Encounter (Signed)
Return call to [facility] PT with [facility] - requesting verbal order for "Medical SW eval for community resources and help w/transportation" also "Continue PT once a week x 4 weeks d/t frequent falls". VO given - sending to PCP for approval or denial. Thanks

## 2022-05-21 NOTE — Patient Outreach (Signed)
  Care Coordination   Follow Up Visit Note   05/21/2022 Name: [name] MRN: [id number] DOB: July 29, 1950  [name] is a 72 y.o. year old female who sees [name], [name], MD for primary care. I spoke with  [name] by phone today.  What matters to the patients health and wellness today?  Waiting on meals on wheels to arrive-complaining of pain and not sleeping well.    Goals Addressed             This Visit's Progress    Provide support, resources and mental health support       Activities and task to complete in order to accomplish goals.    Await call back from [facility]-glad you completed and dropped off the paperwork Continue with  HH OT and PT  Consider participating in activities outside the home- [facility],etc CSW inquiring with [employer] about your application and will update you          SDOH assessments and interventions completed:  Yes     Care Coordination Interventions:  Yes, provided  Interventions Today    Flowsheet Row Most Recent Value  Chronic Disease   Chronic disease during today's visit Congestive Heart Failure (CHF)  General Interventions   General Interventions Discussed/Reviewed Community Resources  [[employer] and [facility] applications pending]  Level of Care Personal Care Services  [Per report, HH has asked PCP to complete PCS paperwork however pt is not Medicaid eligible]       Follow up plan: Follow up call scheduled for 06/03/22    Encounter Outcome:  Pt. Visit Completed

## 2022-05-21 NOTE — Telephone Encounter (Signed)
A  PT therapist  from [facility] is requesting a call back .Marland Kitchen She is in the home with pt at the moment [phone]  name Almira   for verbal orders

## 2022-05-22 ENCOUNTER — Telehealth: Payer: Self-pay | Admitting: *Deleted

## 2022-05-22 ENCOUNTER — Emergency Department (HOSPITAL_COMMUNITY): Payer: PPO

## 2022-05-22 ENCOUNTER — Emergency Department (HOSPITAL_COMMUNITY)
Admission: EM | Admit: 2022-05-22 | Discharge: 2022-05-22 | Disposition: A | Discharge status: Home / Self Care | Payer: PPO | Attending: Emergency Medicine | Admitting: Emergency Medicine

## 2022-05-22 DIAGNOSIS — J320 Chronic maxillary sinusitis: Secondary | ICD-10-CM | POA: Diagnosis not present

## 2022-05-22 DIAGNOSIS — Z794 Long term (current) use of insulin: Secondary | ICD-10-CM | POA: Insufficient documentation

## 2022-05-22 DIAGNOSIS — E119 Type 2 diabetes mellitus without complications: Secondary | ICD-10-CM | POA: Insufficient documentation

## 2022-05-22 DIAGNOSIS — W07XXXA Fall from chair, initial encounter: Secondary | ICD-10-CM | POA: Insufficient documentation

## 2022-05-22 DIAGNOSIS — I6782 Cerebral ischemia: Secondary | ICD-10-CM | POA: Diagnosis not present

## 2022-05-22 DIAGNOSIS — S199XXA Unspecified injury of neck, initial encounter: Secondary | ICD-10-CM | POA: Diagnosis not present

## 2022-05-22 DIAGNOSIS — M25511 Pain in right shoulder: Secondary | ICD-10-CM | POA: Insufficient documentation

## 2022-05-22 DIAGNOSIS — W19XXXA Unspecified fall, initial encounter: Secondary | ICD-10-CM

## 2022-05-22 DIAGNOSIS — Z79899 Other long term (current) drug therapy: Secondary | ICD-10-CM | POA: Insufficient documentation

## 2022-05-22 DIAGNOSIS — S0990XA Unspecified injury of head, initial encounter: Secondary | ICD-10-CM | POA: Diagnosis not present

## 2022-05-22 DIAGNOSIS — I509 Heart failure, unspecified: Secondary | ICD-10-CM | POA: Diagnosis not present

## 2022-05-22 DIAGNOSIS — Z7951 Long term (current) use of inhaled steroids: Secondary | ICD-10-CM | POA: Insufficient documentation

## 2022-05-22 DIAGNOSIS — J449 Chronic obstructive pulmonary disease, unspecified: Secondary | ICD-10-CM | POA: Insufficient documentation

## 2022-05-22 DIAGNOSIS — Z7901 Long term (current) use of anticoagulants: Secondary | ICD-10-CM | POA: Insufficient documentation

## 2022-05-22 DIAGNOSIS — S4991XA Unspecified injury of right shoulder and upper arm, initial encounter: Secondary | ICD-10-CM | POA: Diagnosis not present

## 2022-05-22 DIAGNOSIS — Z8673 Personal history of transient ischemic attack (TIA), and cerebral infarction without residual deficits: Secondary | ICD-10-CM | POA: Diagnosis not present

## 2022-05-22 DIAGNOSIS — S2232XA Fracture of one rib, left side, initial encounter for closed fracture: Secondary | ICD-10-CM | POA: Diagnosis not present

## 2022-05-22 LAB — COMPREHENSIVE METABOLIC PANEL
ALT: 15 U/L (ref 0–44)
AST: 18 U/L (ref 15–41)
Albumin: 3.5 g/dL (ref 3.5–5.0)
Alkaline Phosphatase: 59 U/L (ref 38–126)
Anion gap: 8 (ref 5–15)
BUN: 19 mg/dL (ref 8–23)
CO2: 26 mmol/L (ref 22–32)
Calcium: 9.1 mg/dL (ref 8.9–10.3)
Chloride: 105 mmol/L (ref 98–111)
Creatinine, Ser: 1.16 mg/dL — ABNORMAL HIGH (ref 0.44–1.00)
GFR, Estimated: 50 mL/min — ABNORMAL LOW (ref >60)
Glucose, Bld: 147 mg/dL — ABNORMAL HIGH (ref 70–99)
Potassium: 5 mmol/L (ref 3.5–5.1)
Sodium: 139 mmol/L (ref 135–145)
Total Bilirubin: 0.6 mg/dL (ref 0.3–1.2)
Total Protein: 6.4 g/dL — ABNORMAL LOW (ref 6.5–8.1)

## 2022-05-22 LAB — I-STAT CHEM 8, ED
BUN: 20 mg/dL (ref 8–23)
Calcium, Ion: 1.14 mmol/L — ABNORMAL LOW (ref 1.15–1.40)
Chloride: 105 mmol/L (ref 98–111)
Creatinine, Ser: 1.1 mg/dL — ABNORMAL HIGH (ref 0.44–1.00)
Glucose, Bld: 142 mg/dL — ABNORMAL HIGH (ref 70–99)
HCT: 32 % — ABNORMAL LOW (ref 36.0–46.0)
Hemoglobin: 10.9 g/dL — ABNORMAL LOW (ref 12.0–15.0)
Potassium: 5 mmol/L (ref 3.5–5.1)
Sodium: 139 mmol/L (ref 135–145)
TCO2: 27 mmol/L (ref 22–32)

## 2022-05-22 LAB — CBC
HCT: 35.3 % — ABNORMAL LOW (ref 36.0–46.0)
Hemoglobin: 10.3 g/dL — ABNORMAL LOW (ref 12.0–15.0)
MCH: 24.8 pg — ABNORMAL LOW (ref 26.0–34.0)
MCHC: 29.2 g/dL — ABNORMAL LOW (ref 30.0–36.0)
MCV: 85.1 fL (ref 80.0–100.0)
Platelets: 197 10*3/uL (ref 150–400)
RBC: 4.15 MIL/uL (ref 3.87–5.11)
RDW: 15.6 % — ABNORMAL HIGH (ref 11.5–15.5)
WBC: 6.5 10*3/uL (ref 4.0–10.5)
nRBC: 0 % (ref 0.0–0.2)

## 2022-05-22 MED ORDER — NICOTINE 14 MG/24HR TD PT24
14.0000 mg | MEDICATED_PATCH | Freq: Once | TRANSDERMAL | Status: DC
  Filled 2022-05-22: qty 1

## 2022-05-22 NOTE — Discharge Instructions (Addendum)
You were evaluated today after a fall.  Your head CT, cervical spine CT, and x-rays of the right shoulder showed no acute intracranial abnormality, no fracture, no dislocation.  I have attached fall prevention education materials.  Please follow-up as needed with your primary care provider.

## 2022-05-22 NOTE — ED Notes (Addendum)
Pt transported to CT ?

## 2022-05-22 NOTE — ED Triage Notes (Addendum)
Pt BIB EMS after falling forward out of her recliner this morning and hit her head. No LOC, but pt is on Coumadin. Has a bump on top of her head. Not c/o of neck or back pain at this time. Aox4.

## 2022-05-22 NOTE — Telephone Encounter (Signed)
Patient called in stating she fell again about 30 minutes ago. Hit her head on bed frame. Now with h/a and nausea. EMS is there with her. States VS WNL, small "knot on head," w/o bleeding. They are transporting her to ED 2/2 patient being on warfarin.

## 2022-05-22 NOTE — ED Provider Notes (Signed)
[place] EMERGENCY DEPARTMENT AT [facility] Provider Note   CSN: [id number] Arrival date & time: 05/22/22  0277     History  Chief Complaint  Patient presents with   Durenda Hurt [name] is a 72 y.o. female.  Patient presents to the emergency department via EMS for evaluation secondary to a fall.  Patient reportedly was sitting in her chair, sleeping, when she fell out of the chair.  She is unsure if she hit her head.  She does complain of some right-sided shoulder pain.  No other complaints at this time.  She denies shortness of breath, chest pain, abdominal pain, nausea, vomiting, urinary symptoms.  She states she frequently falls out of her chair.  Past medical history significant for COPD, CHF, fibromyalgia, chronic venous insufficiency, obesity, type II DM  HPI     Home Medications Prior to Admission medications   Medication Sig Start Date End Date Taking? Authorizing Provider  albuterol (PROAIR HFA) 108 (90 Base) MCG/ACT inhaler Inhale 2 puffs into the lungs every 6 (six) hours as needed for shortness of breath. 02/12/22   Miguel Aschoff, MD  ALPRAZolam Prudy Feeler) 1 MG tablet Take 1/2 - 1 tablet (1/2  - 1 mg total) by mouth at bedtime as needed for anxiety or sleep. May take 1/2 tablet during the day if needed for anxiety 03/14/22   Miguel Aschoff, MD  Blood Glucose Monitoring Suppl [name] VERIO REFLECT) w/Device KIT Use to check blood sugar 1 time a day 05/29/21   Earl Lagos, MD  buPROPion (WELLBUTRIN XL) 150 MG 24 hr tablet Take 1 tablet (150 mg total) by mouth every morning. 12/23/21   Miguel Aschoff, MD  cetirizine (ZYRTEC ALLERGY) 10 MG tablet Take 1 tablet (10 mg total) by mouth daily. 09/19/21 09/19/22  Miguel Aschoff, MD  clobetasol ointment (TEMOVATE) 0.05 % Apply to affected area(s) 2 times daily as needed for (irritation). 09/09/21   Miguel Aschoff, MD  Continuous Blood Gluc Sensor (FREESTYLE LIBRE 3 SENSOR) MISC Place  1 sensor on the skin every 14 days. Use to check glucose 6 times daily as directed 12/23/21   Miguel Aschoff, MD  DULoxetine (CYMBALTA) 60 MG capsule Take 1 capsule (60 mg total) by mouth 2 (two) times daily. 03/20/22   Miguel Aschoff, MD  Fluticasone-Umeclidin-Vilant (TRELEGY ELLIPTA) 100-62.5-25 MCG/ACT AEPB Inhale 1 puff into the lungs daily. 03/14/22   Waymon Budge, MD  furosemide (LASIX) 80 MG tablet Take 1 tablet by mouth every morning AND 1/2 tablet daily in the afternoon. Patient taking differently: Take 1 tablet  80 mg by mouth every morning AND 1/2 tablet 40 mg  daily in the afternoon. 09/10/21   Laurey Morale, MD  gabapentin (NEURONTIN) 300 MG capsule Take 1 capsule in the morning and 2 capsules at night Patient taking differently: Take 600 mg by mouth See admin instructions. Take 2 capsules  (600 mg) in the afternnon and 2 capsules( 600 mg) at night 09/19/21   Miguel Aschoff, MD  HYDROcodone-acetaminophen (NORCO/VICODIN) 5-325 MG tablet Take 1-2 tablets by mouth 3 (three) times daily. 03/14/22   Miguel Aschoff, MD  insulin degludec (TRESIBA FLEXTOUCH) 100 UNIT/ML FlexTouch Pen Inject 48 Units into the skin daily. 01/21/22 07/11/22  Miguel Aschoff, MD  Insulin Pen Needle 32G X 4 MM MISC Use to inject insulin up to 4 times a day 01/19/22   Rana Snare, DO  ipratropium (ATROVENT) 0.03 % nasal  spray Place 2 sprays into the nose 3 (three) times daily. 01/23/22   Miguel Aschoff, MD  ipratropium-albuterol (DUONEB) 0.5-2.5 (3) MG/3ML SOLN Inhale 3 mLs by nebulization every 6 (six) hours as needed (shortness of breath, wheezing). 04/03/22   Miguel Aschoff, MD  ketoconazole (NIZORAL) 2 % cream Apply 1 application topically at bedtime as needed for irritation. 07/17/21   Miguel Aschoff, MD  Lancets Misc. (ACCU-CHEK FASTCLIX LANCET) KIT Check your blood 4 times a day dx code 250.00 insulin requiring 01/19/13   Doneen Poisson, MD  lidocaine (LIDODERM) 5 %  Place 1 or 2 patches to painful area of back each day.  Remove & Discard patch within 12 hours or as directed by MD 12/27/21   Miguel Aschoff, MD  meclizine (ANTIVERT) 25 MG tablet Take 1 tablet (25 mg total) by mouth 2 (two) times daily as needed for dizziness. 02/13/22   Miguel Aschoff, MD  metoprolol succinate (TOPROL XL) 25 MG 24 hr tablet Take 1 tablet (25 mg total) by mouth daily. 01/30/22   Adron Bene, MD  nystatin powder Apply topically 3  times daily. 10/16/21   Miguel Aschoff, MD  omeprazole (PRILOSEC) 40 MG capsule Take 1 capsule by mouth in the morning and at bedtime. Patient taking differently: Take 40 mg by mouth 2 (two) times daily. 09/19/21   Miguel Aschoff, MD  ondansetron (ZOFRAN) 4 MG tablet Take 1 tablet (4 mg total) by mouth every 8 (eight) hours as needed for nausea or vomiting. 12/23/21 12/23/22  Miguel Aschoff, MD  OXYGEN Inhale 2 L into the lungs at bedtime.    [provider]  potassium chloride SA (KLOR-CON M) 20 MEQ tablet Take 2 tablets by mouth in the morning and 1 tablet in the afternoon. 01/24/22   Miguel Aschoff, MD  rosuvastatin (CRESTOR) 20 MG tablet Take 1 tablet (20 mg total) by mouth at bedtime. 03/14/22 03/09/23  Miguel Aschoff, MD  Semaglutide, 1 MG/DOSE, 4 MG/3ML SOPN Inject 1 mg into the skin once a week. 05/01/22   Miguel Aschoff, MD  senna-docusate (SENOKOT S) 8.6-50 MG tablet Take 1 to 2 tablets by mouth once or twice a day as needed for constipation 07/04/21   Miguel Aschoff, MD  warfarin (COUMADIN) 2.5 MG tablet Take 1 tablet by mouth daily unless otherwise instructed. 03/14/22   Miguel Aschoff, MD      Allergies    Dexilant [dexlansoprazole], Lorazepam, Oxycontin [oxycodone], and Tramadol hcl    Review of Systems   Review of Systems  Physical Exam Updated Vital Signs BP (!) 156/56 (BP Location: Left Arm)   Pulse 74   Temp (!) 97.5 F (36.4 C) (Oral)   Resp 18   Ht 5\' 2"  (1.575 m)    Wt 75.1 kg   SpO2 96%   BMI 30.27 kg/m  Physical Exam Vitals and nursing note reviewed.  Constitutional:      General: She is not in acute distress.    Appearance: She is well-developed. She is obese.  HENT:     Head: Normocephalic and atraumatic.  Eyes:     Conjunctiva/sclera: Conjunctivae normal.  Cardiovascular:     Rate and Rhythm: Normal rate and regular rhythm.     Heart sounds: No murmur heard. Pulmonary:     Effort: Pulmonary effort is normal. No respiratory distress.     Breath sounds: Normal breath sounds. No rhonchi.  Abdominal:     Palpations: Abdomen  is soft.     Tenderness: There is no abdominal tenderness.  Musculoskeletal:        General: Tenderness present. No swelling.     Cervical back: Neck supple.     Comments: Right clavicle with tenderness to palpation  Skin:    General: Skin is warm and dry.     Capillary Refill: Capillary refill takes less than 2 seconds.  Neurological:     Mental Status: She is alert.  Psychiatric:        Mood and Affect: Mood normal.     ED Results / Procedures / Treatments   Labs (all labs ordered are listed, but only abnormal results are displayed) Labs Reviewed  COMPREHENSIVE METABOLIC PANEL - Abnormal; Notable for the following components:      Result Value   Glucose, Bld 147 (*)    Creatinine, Ser 1.16 (*)    Total Protein 6.4 (*)    GFR, Estimated 50 (*)    All other components within normal limits  CBC - Abnormal; Notable for the following components:   Hemoglobin 10.3 (*)    HCT 35.3 (*)    MCH 24.8 (*)    MCHC 29.2 (*)    RDW 15.6 (*)    All other components within normal limits  I-STAT CHEM 8, ED - Abnormal; Notable for the following components:   Creatinine, Ser 1.10 (*)    Glucose, Bld 142 (*)    Calcium, Ion 1.14 (*)    Hemoglobin 10.9 (*)    HCT 32.0 (*)    All other components within normal limits  URINALYSIS, ROUTINE W REFLEX MICROSCOPIC    EKG None  Radiology CT Head Wo  Contrast  Result Date: 05/22/2022 CLINICAL DATA:  Provided history: Neck trauma. Head trauma, minor. Fall. EXAM: CT HEAD WITHOUT CONTRAST CT CERVICAL SPINE WITHOUT CONTRAST TECHNIQUE: Multidetector CT imaging of the head and cervical spine was performed following the standard protocol without intravenous contrast. Multiplanar CT image reconstructions of the cervical spine were also generated. RADIATION DOSE REDUCTION: This exam was performed according to the departmental dose-optimization program which includes automated exposure control, adjustment of the mA and/or kV according to patient size and/or use of iterative reconstruction technique. COMPARISON:  MRI brain 01/17/2022. Head CT 01/15/2022. Cervical spine CT 01/15/2022. FINDINGS: CT HEAD FINDINGS Brain: Mild-to-moderate generalized cerebral atrophy. Patchy and ill-defined hypoattenuation within the cerebral white matter, nonspecific but compatible with mild chronic small vessel ischemic disease. Chronic lacunar infarct within the left caudate head. There is no acute intracranial hemorrhage. No demarcated cortical infarct. No extra-axial fluid collection. No evidence of an intracranial mass. No midline shift. Vascular: No hyperdense vessel. Atherosclerotic calcifications. Skull: No fracture or aggressive osseous lesion. Sinuses/Orbits: No mass or acute finding within the imaged orbits. Mucous retention cysts and/or polyps measure up to 10 mm, and background moderate mucosal thickening, within the left maxillary sinus. CT CERVICAL SPINE FINDINGS Mildly motion degraded exam. Alignment: Levocurvature of the cervical and visualized upper thoracic spine. Straightening of the expected cervical lordosis. No significant spondylolisthesis. Skull base and vertebrae: The basion-dental and atlanto-dental intervals are maintained.No evidence of acute fracture to the cervical spine. Soft tissues and spinal canal: No prevertebral fluid or swelling. No visible canal  hematoma. Subcentimeter calcified nodules within the left thyroid lobe not meeting consensus criteria for ultrasound follow-up based on size. No follow-up imaging is recommended. Reference: J Am Coll Radiol. 2015 Feb;12(2): 143-50. Disc levels: No significant bony spinal canal or neural narrowing within the cervical  spine. Upper chest: No consolidation within the imaged lung apices. No visible pneumothorax. Prior median sternotomy. Centrilobular and paraseptal emphysema. Other: Prior ORIF of the right clavicle. IMPRESSION: CT head: 1.  No evidence of an acute intracranial abnormality. 2. Mild chronic small vessel ischemic changes within the cerebral white matter. 3. Chronic lacunar infarct within the left caudate head. 4. Mild-to-moderate generalized cerebral atrophy. 5. Maxillary sinus disease, as described. CT cervical spine: 1. Mildly motion degraded exam. 2. No evidence of acute fracture to the cervical spine. 3. Levocurvature of the cervical and visualized upper thoracic spine. 4. Nonspecific straightening of the expected cervical lordosis. 5.  Emphysema (ICD10-J43.9). Electronically Signed   By: Jackey Loge D.O.   On: 05/22/2022 10:52   CT Cervical Spine Wo Contrast  Result Date: 05/22/2022 CLINICAL DATA:  Provided history: Neck trauma. Head trauma, minor. Fall. EXAM: CT HEAD WITHOUT CONTRAST CT CERVICAL SPINE WITHOUT CONTRAST TECHNIQUE: Multidetector CT imaging of the head and cervical spine was performed following the standard protocol without intravenous contrast. Multiplanar CT image reconstructions of the cervical spine were also generated. RADIATION DOSE REDUCTION: This exam was performed according to the departmental dose-optimization program which includes automated exposure control, adjustment of the mA and/or kV according to patient size and/or use of iterative reconstruction technique. COMPARISON:  MRI brain 01/17/2022. Head CT 01/15/2022. Cervical spine CT 01/15/2022. FINDINGS: CT HEAD  FINDINGS Brain: Mild-to-moderate generalized cerebral atrophy. Patchy and ill-defined hypoattenuation within the cerebral white matter, nonspecific but compatible with mild chronic small vessel ischemic disease. Chronic lacunar infarct within the left caudate head. There is no acute intracranial hemorrhage. No demarcated cortical infarct. No extra-axial fluid collection. No evidence of an intracranial mass. No midline shift. Vascular: No hyperdense vessel. Atherosclerotic calcifications. Skull: No fracture or aggressive osseous lesion. Sinuses/Orbits: No mass or acute finding within the imaged orbits. Mucous retention cysts and/or polyps measure up to 10 mm, and background moderate mucosal thickening, within the left maxillary sinus. CT CERVICAL SPINE FINDINGS Mildly motion degraded exam. Alignment: Levocurvature of the cervical and visualized upper thoracic spine. Straightening of the expected cervical lordosis. No significant spondylolisthesis. Skull base and vertebrae: The basion-dental and atlanto-dental intervals are maintained.No evidence of acute fracture to the cervical spine. Soft tissues and spinal canal: No prevertebral fluid or swelling. No visible canal hematoma. Subcentimeter calcified nodules within the left thyroid lobe not meeting consensus criteria for ultrasound follow-up based on size. No follow-up imaging is recommended. Reference: J Am Coll Radiol. 2015 Feb;12(2): 143-50. Disc levels: No significant bony spinal canal or neural narrowing within the cervical spine. Upper chest: No consolidation within the imaged lung apices. No visible pneumothorax. Prior median sternotomy. Centrilobular and paraseptal emphysema. Other: Prior ORIF of the right clavicle. IMPRESSION: CT head: 1.  No evidence of an acute intracranial abnormality. 2. Mild chronic small vessel ischemic changes within the cerebral white matter. 3. Chronic lacunar infarct within the left caudate head. 4. Mild-to-moderate generalized  cerebral atrophy. 5. Maxillary sinus disease, as described. CT cervical spine: 1. Mildly motion degraded exam. 2. No evidence of acute fracture to the cervical spine. 3. Levocurvature of the cervical and visualized upper thoracic spine. 4. Nonspecific straightening of the expected cervical lordosis. 5.  Emphysema (ICD10-J43.9). Electronically Signed   By: Jackey Loge D.O.   On: 05/22/2022 10:52   DG Shoulder Right  Result Date: 05/22/2022 CLINICAL DATA:  Trauma, fall EXAM: RIGHT SHOULDER - 2+ VIEW COMPARISON:  None Available. FINDINGS: No displaced fracture or dislocation is seen. There is  previous internal fixation in right clavicle. There is an electronic device in the posterior aspect of right shoulder, possibly a monitoring device. Metallic sutures are seen in the sternum. Surgical clips are seen in neck. IMPRESSION: No recent fracture or dislocation is seen. There is previous internal fixation in right clavicle. Electronically Signed   By: Ernie Avena M.D.   On: 05/22/2022 10:51    Procedures Procedures    Medications Ordered in ED Medications  nicotine (NICODERM CQ - dosed in mg/24 hours) patch 14 mg (has no administration in time range)    ED Course/ Medical Decision Making/ A&P                             Medical Decision Making Amount and/or Complexity of Data Reviewed Labs: ordered. Radiology: ordered.  Risk OTC drugs.   This patient presents to the ED for concern of head trauma post fall, this involves an extensive number of treatment options, and is a complaint that carries with it a high risk of complications and morbidity.  The differential diagnosis includes intracranial abnormality, contusion, abrasion, and others   Co morbidities that complicate the patient evaluation  History of frequent falls   Additional history obtained:  Additional history obtained from EMS   Lab Tests:  I Ordered, and personally interpreted labs.  The pertinent results  include: Creatinine 1.16, hemoglobin 10.3 (improved from prior)   Imaging Studies ordered:  I ordered imaging studies including CT head and cervical spine, plain films of the right shoulder I independently visualized and interpreted imaging which showed  CT head:    1.  No evidence of an acute intracranial abnormality.  2. Mild chronic small vessel ischemic changes within the cerebral  white matter.  3. Chronic lacunar infarct within the left caudate head.  4. Mild-to-moderate generalized cerebral atrophy.  5. Maxillary sinus disease, as described.    CT cervical spine:    1. Mildly motion degraded exam.  2. No evidence of acute fracture to the cervical spine.  3. Levocurvature of the cervical and visualized upper thoracic  spine.  4. Nonspecific straightening of the expected cervical lordosis.  5.  Emphysema   No recent fracture or dislocation is seen. There is previous  internal fixation in right clavicle.   I agree with the radiologist interpretation   Problem List / ED Course / Critical interventions / Medication management   I ordered medication including nicotine patch  Reevaluation of the patient after these medicines showed that the patient stayed the same I have reviewed the patients home medicines and have made adjustments as needed    Test / Admission - Considered:  The patient's head CT and C-spine CT were negative for acute pathology. Lab work was grossly unremarkable. Shoulder films also negative for acute findings. No indication for further emergent workup. Plan to discharge patient home with education on fall prevention.          Final Clinical Impression(s) / ED Diagnoses Final diagnoses:  Fall, initial encounter    Rx / DC Orders ED Discharge Orders     None         Pamala Duffel 05/22/22 1159    Tanda Rockers A, DO 05/29/22 1532

## 2022-05-22 NOTE — Progress Notes (Signed)
Orthopedic Tech Progress Note Patient Details:  [name] November 05, 1950 [id number]         Bella Kennedy A Samhita Kretsch 05/22/2022, 11:25 AM Level 2 Trauma. Not needed

## 2022-05-22 NOTE — Telephone Encounter (Signed)
Patient called in stating imaging was negative so they are sending her home. States, "I don't know why this keeps happening (falls), I don't know what to do."

## 2022-05-23 ENCOUNTER — Other Ambulatory Visit: Payer: Self-pay | Admitting: Internal Medicine

## 2022-05-23 DIAGNOSIS — Z79899 Other long term (current) drug therapy: Secondary | ICD-10-CM

## 2022-05-23 NOTE — Telephone Encounter (Signed)
Fell from chair again and sustained scalp  hematoma from striking head against her metal bedframe (chair is next to bed). Feeling ok now. 2 falls/week, mostly from chair where she is napping.   Bed is tall, has to crawl into it.  No clear day/night schedule; she is trying to sleep at night, but tends to catnap during day and night.  Wears her CPAP anytime she is sleeping (moves between bed and chair easily).  New CPAP mask fits well.  Hasn't heard from [facility] about 02 yet.. PT is extended for another 4 weeks.  No balance problem or lightheadedness when walking.  Discussed my concern that her sleepiness is due to sedation from her medicines (gabapentin, hydrocodone, alprazolam) and that as she ages, she isn't able to handle these chronic meds as she once did (on them for years).  She was nervous about considering decreasing her pain medicine.    We discussed med management in general.  Her arthritic hand pain is making it difficult to manage pill bottles.  Prepackaged meds was proposed and she is interested.  Will place pharmacy referral order with request to help [place] organize her meds so that adherence and timing of administration can be as consistent as possible.

## 2022-05-26 ENCOUNTER — Telehealth: Payer: Self-pay

## 2022-05-26 NOTE — Progress Notes (Signed)
   Care Guide Note  05/26/2022 Name: [name] MRN: [id number] DOB: 1950-08-22  Referred by: Miguel Aschoff, MD Reason for referral : Care Coordination (Outreach to schedule with Pharm d )   [name] is a 72 y.o. year old female who is a primary care patient of Miguel Aschoff, MD. [name] was referred to the pharmacist for assistance related to HTN.    Successful contact was made with the patient to discuss pharmacy services including being ready for the pharmacist to call at least 5 minutes before the scheduled appointment time, to have medication bottles and any blood sugar or blood pressure readings ready for review. The patient agreed to meet with the pharmacist via with the pharmacist via telephone visit on (date/time).  06/12/2022  Penne Lash, RMA Care Guide [facility]  [place], [place] [address] Direct Dial: [phone] [email or web address]

## 2022-05-26 NOTE — Telephone Encounter (Signed)
Patient reported results of PST FS POC INR determination as 3.1 on a regimen of Su-2.5mg M-2.5mg T-1.25mg W-2.5mg Th-1.25mg F-2.5mg Sa-1.25mg  (using 2.5mg  strength tablets). Will OMIT TODAYs dose (0 tablets) and recommence tomorrow for 11.25mg  warfarin/wk as:  Su-2.5mg M-0mg T-1.25mg W-2.5mg Th-1.25mg F-2.5mg Sa-1.25mg   No bleeding endorsed by the patient.

## 2022-05-26 NOTE — Progress Notes (Signed)
   Care Guide Note  05/26/2022 Name: [name] MRN: [id number] DOB: July 25, 1950  Referred by: Miguel Aschoff, MD Reason for referral : Care Coordination (Outreach to schedule with Pharm d )   [name] is a 72 y.o. year old female who is a primary care patient of Miguel Aschoff, MD. [name] was referred to the pharmacist for assistance related to HTN and COPD.    An unsuccessful telephone outreach was attempted today to contact the patient who was referred to the pharmacy team for assistance with medication management. Additional attempts will be made to contact the patient.   [name], RMA Care Guide [facility]  [place], [place] [address] Direct Dial: [phone] [name]

## 2022-05-28 ENCOUNTER — Telehealth: Payer: Self-pay | Admitting: *Deleted

## 2022-05-28 NOTE — Telephone Encounter (Signed)
        Patient  visited [facility] on 05/22/2022  for treatment   Telephone encounter attempt :  1st  A HIPAA compliant voice message was left requesting a return call.  Instructed patient to call back at [phone].  Yehuda Mao Greenauer -[facility] [place], Population Health [phone] [address] [place] , [place] [address] Email : [email or web address]

## 2022-05-29 ENCOUNTER — Encounter: Payer: HMO | Admitting: Internal Medicine

## 2022-05-29 ENCOUNTER — Telehealth: Payer: Self-pay | Admitting: *Deleted

## 2022-05-29 NOTE — Telephone Encounter (Signed)
     Patient  visit on 05/22/2022  at [facility]  was for treat ment   Have you been able to follow up with your primary care physician? Patient provided information on tranportation benefits she is doing well going to dr on 23rd will use the [facility] transportation as needed as she had HTA and no NME transportation benefit also offered a [employer] [place] APP  The patient was able to obtain any needed medicine or equipment.  Are there diet recommendations that you are having difficulty following? na  Patient expresses understanding of discharge instructions and education provided has no other needs at this time.  yes  Alois Cliche -[facility] [place], Population Health [phone] [address] [place] , [place] [address] Email : [email or web address]

## 2022-06-01 ENCOUNTER — Other Ambulatory Visit: Payer: Self-pay | Admitting: Acute Care

## 2022-06-01 DIAGNOSIS — Z122 Encounter for screening for malignant neoplasm of respiratory organs: Secondary | ICD-10-CM

## 2022-06-01 DIAGNOSIS — Z87891 Personal history of nicotine dependence: Secondary | ICD-10-CM

## 2022-06-01 DIAGNOSIS — F17210 Nicotine dependence, cigarettes, uncomplicated: Secondary | ICD-10-CM

## 2022-06-02 ENCOUNTER — Other Ambulatory Visit (HOSPITAL_COMMUNITY): Payer: Self-pay

## 2022-06-02 ENCOUNTER — Telehealth: Payer: Self-pay | Admitting: Pharmacist

## 2022-06-02 ENCOUNTER — Other Ambulatory Visit: Payer: Self-pay | Admitting: Internal Medicine

## 2022-06-02 DIAGNOSIS — Z1231 Encounter for screening mammogram for malignant neoplasm of breast: Secondary | ICD-10-CM

## 2022-06-02 NOTE — Telephone Encounter (Signed)
Patient reports value of PST FS POC INR = 3.0 on 11.25 milligrams of warfarin per week. No bleeding, no new medications, no missed doses. Patient advised to DECREASE to 8.75 milligrams of warfarin per week as follows: M/0mg , Tu-Sa 1.25mg , Su2.5mg . Repeat PST FS POC INR on Monday 29APR24.

## 2022-06-03 ENCOUNTER — Ambulatory Visit (INDEPENDENT_AMBULATORY_CARE_PROVIDER_SITE_OTHER): Payer: PPO | Admitting: Internal Medicine

## 2022-06-03 ENCOUNTER — Ambulatory Visit: Payer: Self-pay | Admitting: *Deleted

## 2022-06-03 ENCOUNTER — Telehealth: Payer: Self-pay | Admitting: *Deleted

## 2022-06-03 VITALS — BP 121/62 | HR 62 | Temp 98.0°F | Ht 62.0 in | Wt 161.5 lb

## 2022-06-03 DIAGNOSIS — I10 Essential (primary) hypertension: Secondary | ICD-10-CM | POA: Diagnosis not present

## 2022-06-03 DIAGNOSIS — G4734 Idiopathic sleep related nonobstructive alveolar hypoventilation: Secondary | ICD-10-CM | POA: Diagnosis not present

## 2022-06-03 DIAGNOSIS — I739 Peripheral vascular disease, unspecified: Secondary | ICD-10-CM

## 2022-06-03 DIAGNOSIS — R2681 Unsteadiness on feet: Secondary | ICD-10-CM

## 2022-06-03 DIAGNOSIS — R413 Other amnesia: Secondary | ICD-10-CM | POA: Diagnosis not present

## 2022-06-03 DIAGNOSIS — Z79899 Other long term (current) drug therapy: Secondary | ICD-10-CM

## 2022-06-03 DIAGNOSIS — R1011 Right upper quadrant pain: Secondary | ICD-10-CM | POA: Diagnosis not present

## 2022-06-03 DIAGNOSIS — R5381 Other malaise: Secondary | ICD-10-CM

## 2022-06-03 DIAGNOSIS — F1190 Opioid use, unspecified, uncomplicated: Secondary | ICD-10-CM | POA: Diagnosis not present

## 2022-06-03 NOTE — Patient Instructions (Signed)
Visit Information  Thank you for taking time to visit with me today. Please don't hesitate to contact me if I can be of assistance to you.   Following are the goals we discussed today:   Goals Addressed             This Visit's Progress    Provide support, resources and mental health support       Activities and task to complete in order to accomplish goals.    Referral made and  now await call back from Aging Gracefully  Continue with  [facility] OT and PT  Consider participating in activities outside the home- Sr Center,etc CSW inquiring with SCAT for update on application and will update you          Our next appointment is by telephone on 06/10/22 at 9am  Please call the care guide team at [phone] if you need to cancel or reschedule your appointment.   If you are experiencing a Mental Health or Behavioral Health Crisis or need someone to talk to, please call the Suicide and Crisis Lifeline: 988 call 911   The patient verbalized understanding of instructions, educational materials, and care plan provided today and DECLINED offer to receive copy of patient instructions, educational materials, and care plan.   Telephone follow up appointment with care management team member scheduled for:06/10/22  Reece Levy, MSW, LCSW Clinical Social Worker [facility] [phone]
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
## 2022-06-03 NOTE — Patient Outreach (Signed)
  Care Coordination   Follow Up Visit Note   06/03/2022 Name: [name] MRN: [id number] DOB: 04-10-1950  [name] is a 72 y.o. year old female who sees [name], [name], MD for primary care. I spoke with  [name] by phone today.  What matters to the patients health and wellness today?  Going to see PCP today.    Goals Addressed             This Visit's Progress    Provide support, resources and mental health support       Activities and task to complete in order to accomplish goals.    Referral made and  now await call back from Aging Gracefully  Continue with  [facility] OT and PT  Consider participating in activities outside the home- Sr Center,etc CSW inquiring with SCAT for update on application and will update you          SDOH assessments and interventions completed:  Yes     Care Coordination Interventions:  Yes, provided  Interventions Today    Flowsheet Row Most Recent Value  Chronic Disease   Chronic disease during today's visit Congestive Heart Failure (CHF)  General Interventions   General Interventions Discussed/Reviewed Surveyor, minerals folllow up call from [employer] as well as SCAT update]  Mental Health Interventions   Mental Health Discussed/Reviewed Coping Strategies, Mental Health Discussed  Safety Interventions   Safety Discussed/Reviewed Safety Discussed, Fall Risk        Follow up plan: Follow up call scheduled for 06/10/22    Encounter Outcome:  Pt. Visit Completed

## 2022-06-03 NOTE — Telephone Encounter (Signed)
Patient seen in office today,spoke with patient regarding her PCS request / states she was denied that the coverage she had did not cover PCS. Patient states she to re apply for medicaid.

## 2022-06-05 ENCOUNTER — Ambulatory Visit: Payer: PPO

## 2022-06-09 ENCOUNTER — Other Ambulatory Visit (HOSPITAL_COMMUNITY): Payer: Self-pay

## 2022-06-09 ENCOUNTER — Telehealth: Payer: Self-pay | Admitting: Dietician

## 2022-06-09 ENCOUNTER — Other Ambulatory Visit: Payer: Self-pay | Admitting: Internal Medicine

## 2022-06-09 ENCOUNTER — Telehealth: Payer: Self-pay | Admitting: Internal Medicine

## 2022-06-09 ENCOUNTER — Other Ambulatory Visit: Payer: Self-pay

## 2022-06-09 DIAGNOSIS — E1140 Type 2 diabetes mellitus with diabetic neuropathy, unspecified: Secondary | ICD-10-CM

## 2022-06-09 MED ORDER — TRESIBA FLEXTOUCH 100 UNIT/ML ~~LOC~~ SOPN
44.0000 [IU] | PEN_INJECTOR | Freq: Every day | SUBCUTANEOUS | 5 refills | Status: DC
Start: 2022-06-09 — End: 2023-01-30
  Filled 2022-06-09: qty 12, 27d supply, fill #0
  Filled 2022-06-13: qty 15, 30d supply, fill #0
  Filled 2022-06-26 – 2022-06-28 (×2): qty 12, 27d supply, fill #0
  Filled 2022-08-06: qty 12, 27d supply, fill #1
  Filled 2022-08-28: qty 12, 27d supply, fill #2
  Filled 2022-09-29: qty 12, 27d supply, fill #3
  Filled 2022-12-22: qty 12, 27d supply, fill #4
  Filled 2023-01-09 – 2023-01-12 (×2): qty 12, 27d supply, fill #5

## 2022-06-09 NOTE — Telephone Encounter (Signed)
Spoke with patient regarding her PCS request on 05/29/22, patient states she was denied. Patient states she is going to try again with medicaid. Will let us know if and when she gets the medicaid.

## 2022-06-09 NOTE — Telephone Encounter (Signed)
Called patient per triage request about low blood sugars. Left voicemail for return call

## 2022-06-09 NOTE — Telephone Encounter (Signed)
Pt reporting that since changing her diabetes medicine recently her BS have been dropping down into the 50's mostly at nigh time . Pt reports this has been happening for the past 2 weeks.  Please call the patient back.

## 2022-06-09 NOTE — Telephone Encounter (Signed)
[name] calls stating her blood sugars have been going low more frequently.  Sghe states her medications are the following: .  [name]- 48 daily Semaglutide- 1 mg weekly- that was increased 2-3 weeks ago from 0.5 mg weekly  We discussed for her to  Reduce Tresiba to 44 to accommodate the increased dose of Ozempic. If [name] does not agree then we will call [name] back with new orders.  She also asked me to ask the front desk to schedule and notify her of her appointment day and time for  an  appointment for 4 weeks after her 5/16/ appointment. she likes Tuesday or Thursday  morning appointments.

## 2022-06-09 NOTE — Telephone Encounter (Signed)
Patient texted results of FS POC PST INR = 1.8 (Target INR range 2.0 - 2.5). on 8.75 milligrams of warfarin per WEEK. Dose increased to 10 milligrams of warfarin per WEEK as: Su-2.5mg M-1.25mg T-1.25mg W-1.25mg Th-1.25mg F-1.25mg Sa-1.25mg . INR 6MAY24.

## 2022-06-10 ENCOUNTER — Ambulatory Visit: Payer: Self-pay | Admitting: *Deleted

## 2022-06-10 ENCOUNTER — Other Ambulatory Visit (HOSPITAL_COMMUNITY): Payer: Self-pay

## 2022-06-10 IMAGING — DX DG CHEST 2V
2 series · 2 of 2 positions shown · non-contrast
Comparison: 11/13/2017

CLINICAL DATA: COPD

EXAM:
CHEST - 2 VIEW

[chest pa]
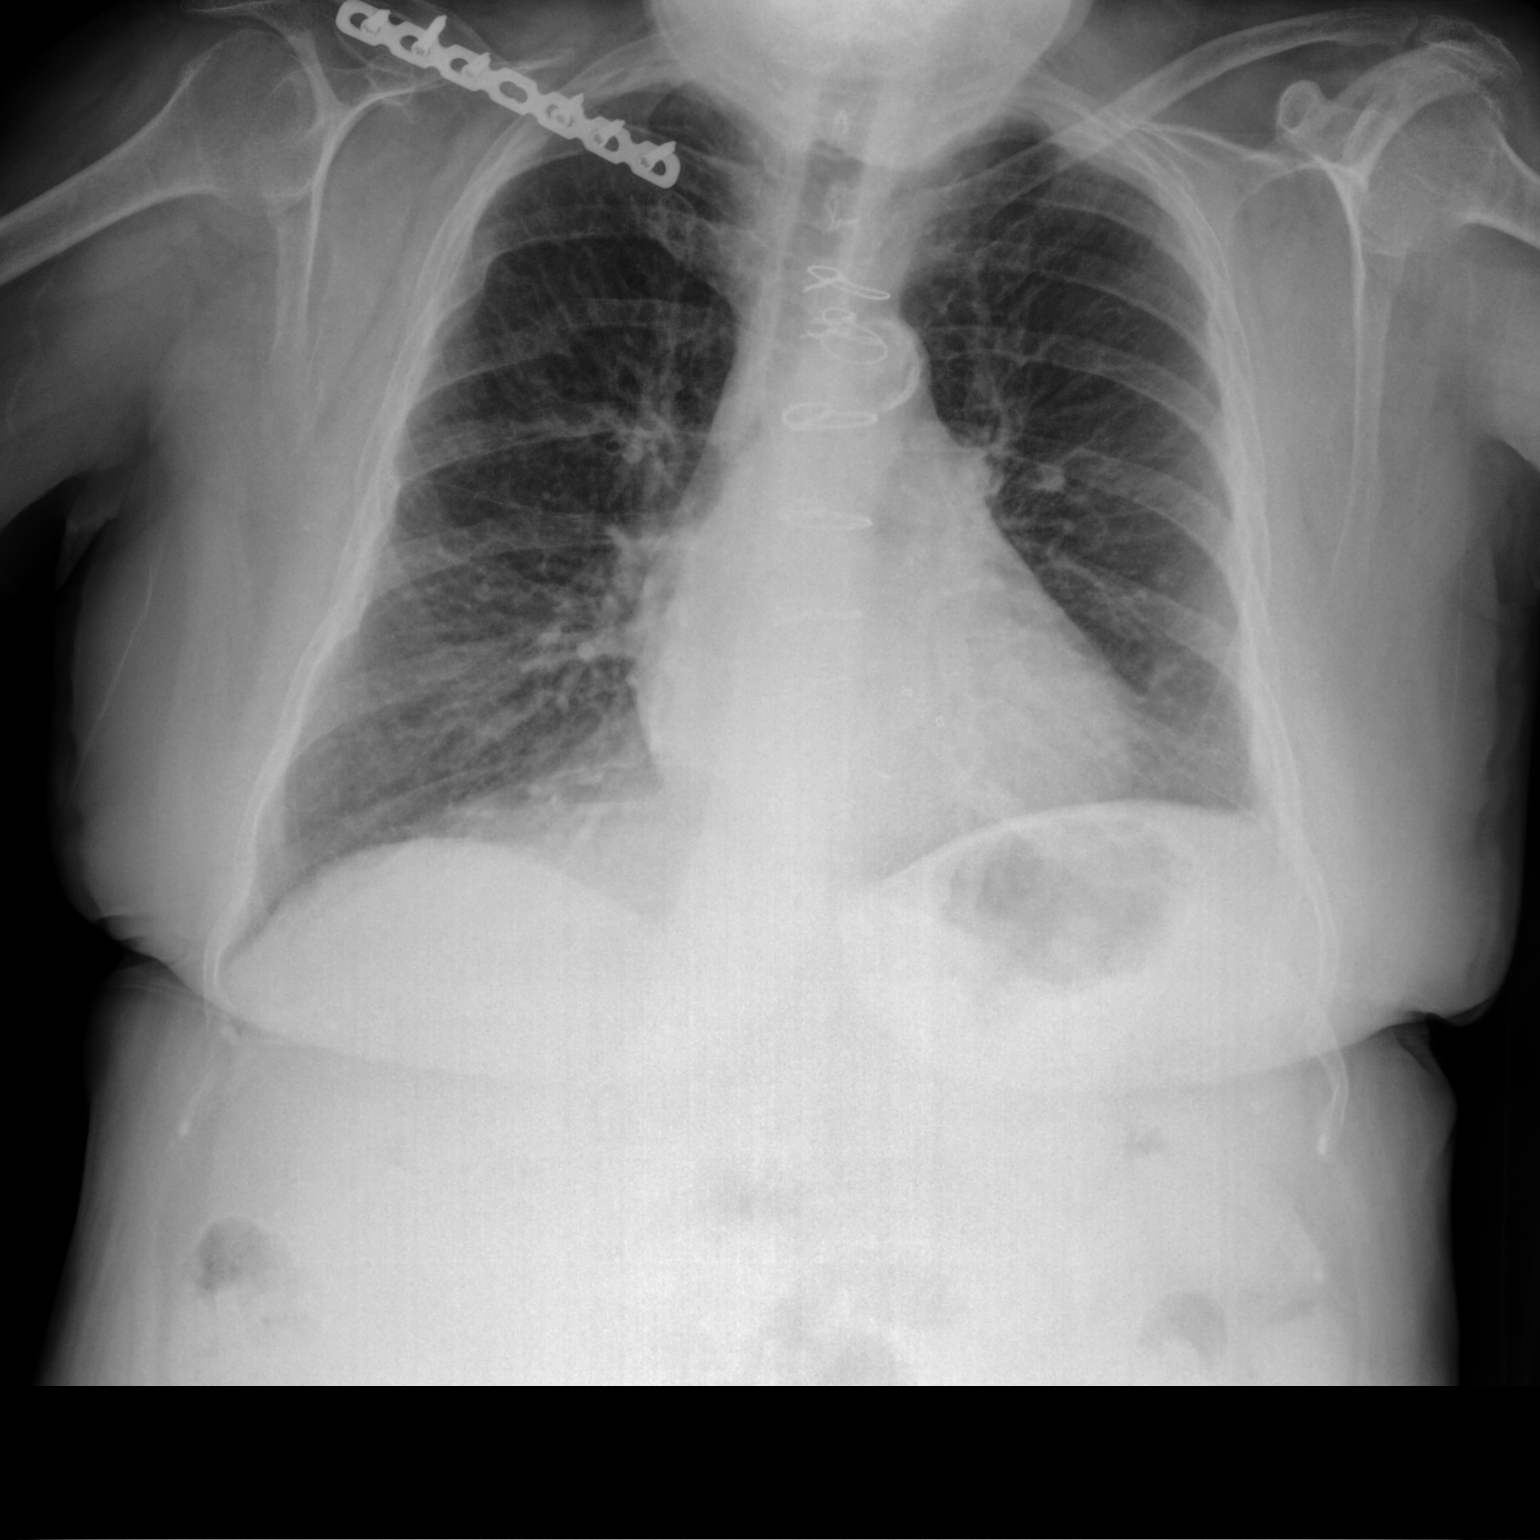

[chest lat]
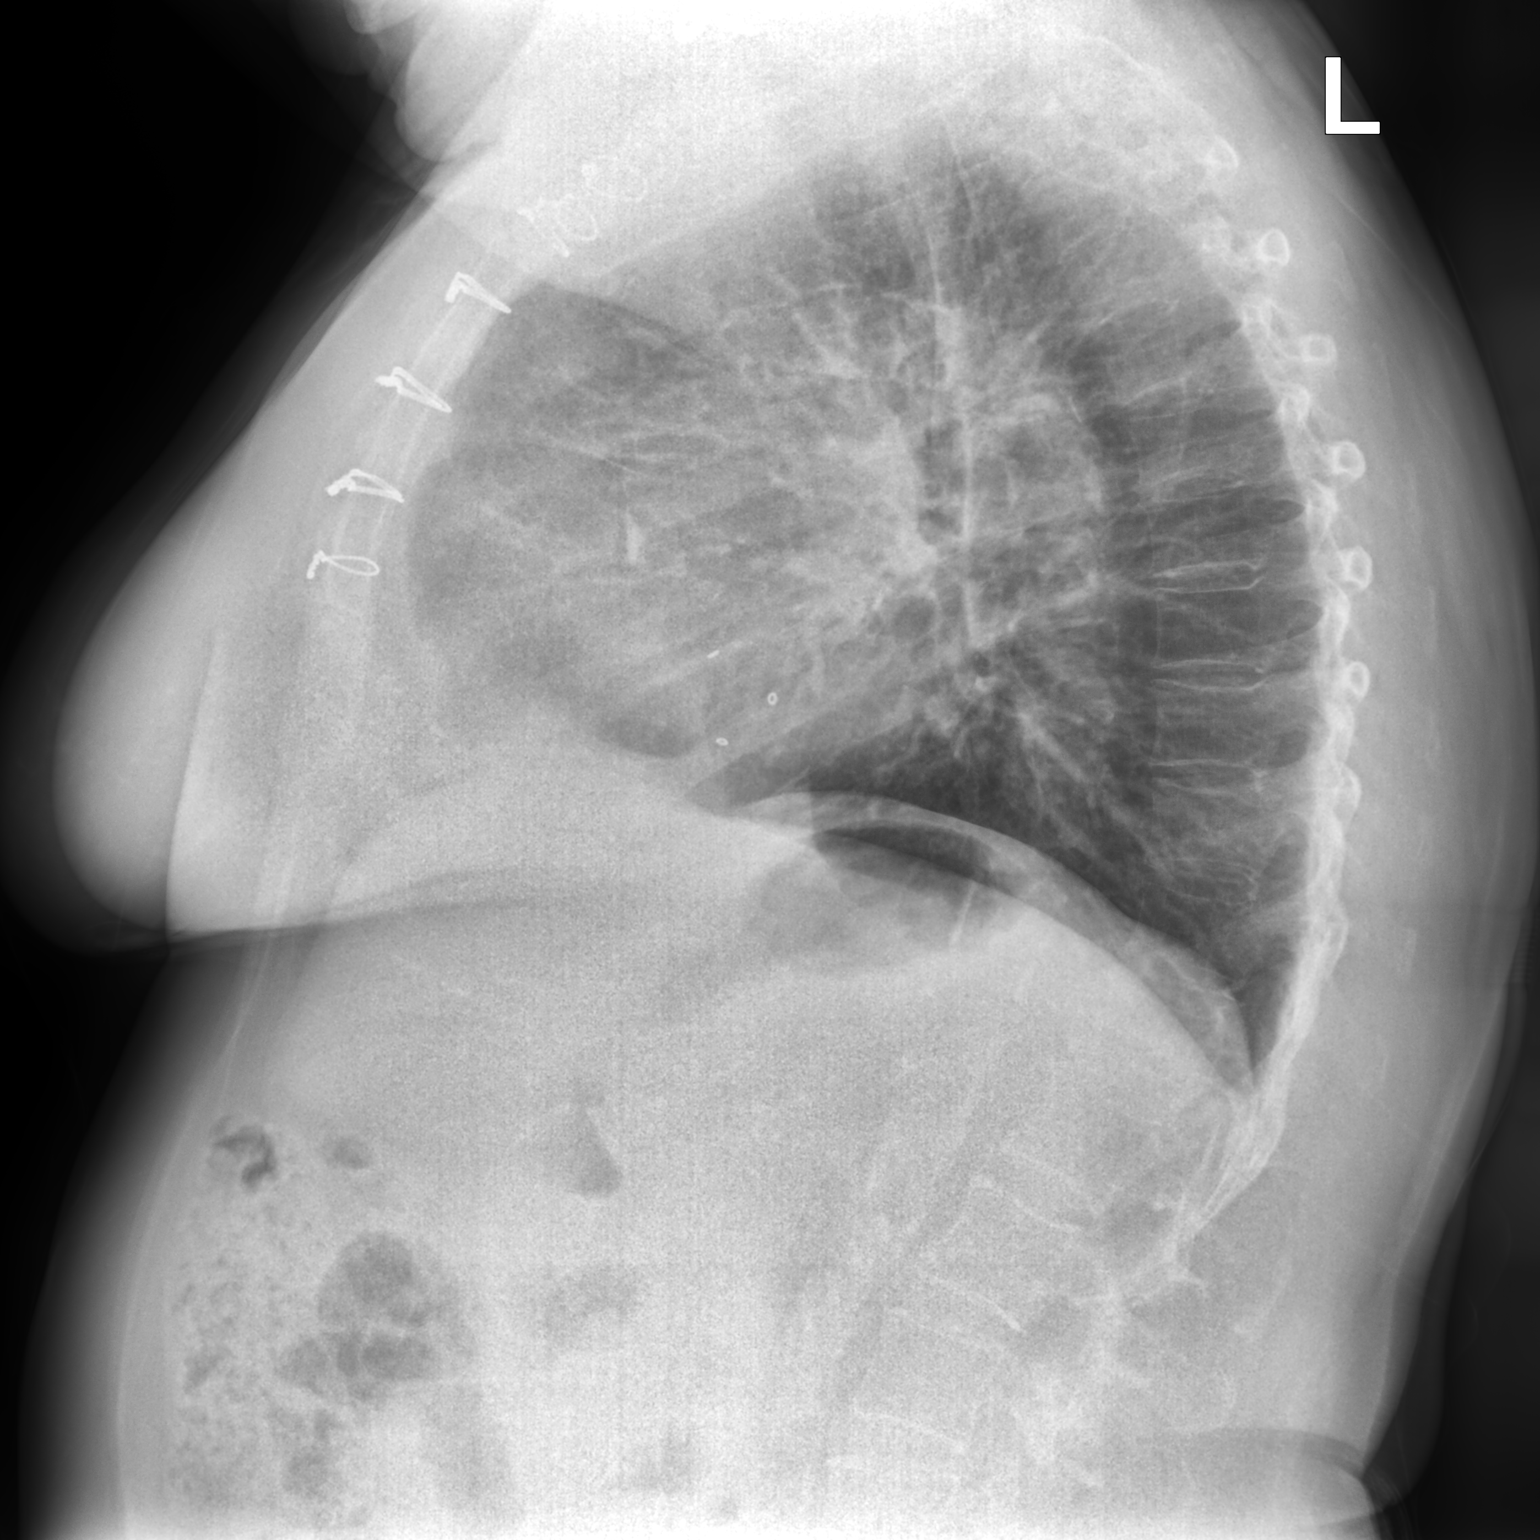

[2 of 2 positions shown; findings below may reference images not displayed]

FINDINGS: Prior median sternotomy and cardiac valve replacement. Heart size
within normal limits. Atherosclerotic calcification of the aortic
knob. Mild pulmonary vascular congestion. Bibasilar interstitial
prominence with subtle nodularity within the left mid to lower lung
field. No pleural effusion or pneumothorax. Prior right clavicular
ORIF and multiple remote right-sided rib fractures.
IMPRESSION: Bibasilar interstitial prominence with subtle nodularity within the
left mid to lower lung field which could represent foci of infection
or inflammation. Underlying pulmonary nodule not excluded. Recommend
radiographic follow-up to resolution.

## 2022-06-10 NOTE — Patient Instructions (Signed)
Visit Information  Thank you for taking time to visit with me today. Please don't hesitate to contact me if I can be of assistance to you.   Following are the goals we discussed today:   Goals Addressed             This Visit's Progress    Provide support, resources and mental health support       Activities and task to complete in order to accomplish goals.    Communicate with PCP any side effects from your overdosing on the insulin today Referral made and now await call back from Aging Gracefully  Continue with  [facility] OT and PT  Consider participating in activities outside the home- Sr Center,etc CSW inquired CAT for update on application and will update you Look into the PAP[employer] PAL program mentioned today- sounds promising for you to get some help/support          Our next appointment is by telephone on 06/19/22   Please call the care guide team at [phone] if you need to cancel or reschedule your appointment.   If you are experiencing a Mental Health or Behavioral Health Crisis or need someone to talk to, please call the Suicide and Crisis Lifeline: 988 call 911   The patient verbalized understanding of instructions, educational materials, and care plan provided today and DECLINED offer to receive copy of patient instructions, educational materials, and care plan.   Telephone follow up appointment with care management team member scheduled for:06/19/22  Reece Levy, MSW, LCSW Clinical Social Worker [facility] [phone]

## 2022-06-10 NOTE — Patient Outreach (Signed)
  Care Coordination   Follow Up Visit Note   06/10/2022 Name: [name] MRN: [id number] DOB: 1950/10/22  [name] is a 72 y.o. year old female who sees [name], [name], MD for primary care. I spoke with  [name] by phone today.  What matters to the patients health and wellness today?  "I overdosed on my insulin by accident"    Goals Addressed             This Visit's Progress    Provide support, resources and mental health support       Activities and task to complete in order to accomplish goals.    Communicate with PCP any side effects from your overdosing on the insulin today Referral made and now await call back from Aging Gracefully  Continue with  [facility] OT and PT  Consider participating in activities outside the home- Sr Center,etc CSW inquired CAT for update on application and will update you Look into the PAP[employer] PAL program mentioned today- sounds promising for you to get some help/support          SDOH assessments and interventions completed:  Yes     Care Coordination Interventions:  Yes, provided  Interventions Today    Flowsheet Row Most Recent Value  Chronic Disease   Chronic disease during today's visit Diabetes  General Interventions   General Interventions Discussed/Reviewed [employer] word from [employer]. Also, need to consider PAPA PAL thru HTA and await SCAT determination]  Mental Health Interventions   Mental Health Discussed/Reviewed Mental Health Discussed, Coping Strategies  Pharmacy Interventions   Pharmacy Dicussed/Reviewed Pharmacy Topics Discussed  [Pt made an error (again today)  and overtook her RX for insulin- CSW will alert PCP and Pharmacist- pt reminded to call with any symptoms to PCP]       Follow up plan: Follow up call scheduled for 06/19/22    Encounter Outcome:  Pt. Visit Completed

## 2022-06-11 ENCOUNTER — Other Ambulatory Visit: Payer: Self-pay

## 2022-06-11 ENCOUNTER — Encounter: Payer: Self-pay | Admitting: Internal Medicine

## 2022-06-11 DIAGNOSIS — G4733 Obstructive sleep apnea (adult) (pediatric): Secondary | ICD-10-CM | POA: Diagnosis not present

## 2022-06-11 NOTE — Assessment & Plan Note (Signed)
RUQ  ultrasound ordered a few months ago wasn't completed, though pain has largely abated.  Monitor.

## 2022-06-11 NOTE — Assessment & Plan Note (Addendum)
I'm certain that abnormal sleep/wake cycle is a consequence of medications.  No worsening of symptoms after cessation of gabapentin.  Next step is to work on reduction and cessation of alprazolam (which she is taking more for sleep than anything.  Discussed the adverse effects and desire to replace with a non-benzo alternative. Discussed referral to [facility] pharmacy for assistance with packaging to promote adherence and minimize overuse and overlap of medications.

## 2022-06-11 NOTE — Assessment & Plan Note (Signed)
BP controlled 121/62 on current regimen.  NO changes, monitor.

## 2022-06-11 NOTE — Assessment & Plan Note (Signed)
[name] is on back order.  Has received PT which was helpful.  No falls since our last conversation, though she remains at high risk.

## 2022-06-11 NOTE — Assessment & Plan Note (Signed)
[name] continues to ponder the sale of her home which she is having difficulty taking care of.  Pursuing Medicaid and eventual admission to an ALF.  She will miss her cat terribly....  Family: Daughter French Ana is in [place], with 4 boys.  If [name] would move there, she would have to pay for her own apartment and doesn't have funds.  This wouldn't provide 24/7 support anyway. Daughter Babette Relic is south in [place] area.  She doesn't provide much support.  Sister Meriam Sprague in in town and provides most help though she is fatiguing.

## 2022-06-11 NOTE — Patient Outreach (Signed)
Aging Gracefully Program  06/11/2022  [name] 17-Aug-1950 [id number]   [facility] Evaluation Interviewer made contact with patient. Aging Gracefully survey completed.   Interviewer will send referral to RN and OT for follow up.   Vanice Sarah Care Management Assistant [phone]

## 2022-06-11 NOTE — Assessment & Plan Note (Signed)
Foot exam unchanged from two weeks ago.  NO increase in pain, no foot wounds.

## 2022-06-11 NOTE — Assessment & Plan Note (Signed)
[facility] ([facility]) supplies her CPAP; she has had problems with malfunction, tech has come, problem recurred.  Wishes to change to a new o2 supplier (we are checking with [employer]) for concentrator, portable tanks. She is clear about not wanting a new mask - which was prescribed by pulmonologist Dr. Maple Hudson (though her sleep study didn't document OSA, she felt better with treatment).

## 2022-06-11 NOTE — Assessment & Plan Note (Signed)
Stable doses.  No inappropriate use or refill requests.  No constipation.  PDMP reviewed.  Ideally would be in a supervised setting for a very slow reduction in dosing.

## 2022-06-11 NOTE — Assessment & Plan Note (Signed)
F/u visit in a couple weeks will be dedicated to cognitive screening barring more acute issues.

## 2022-06-12 ENCOUNTER — Other Ambulatory Visit: Payer: PPO | Admitting: Pharmacist

## 2022-06-12 ENCOUNTER — Other Ambulatory Visit (HOSPITAL_COMMUNITY): Payer: Self-pay

## 2022-06-12 ENCOUNTER — Other Ambulatory Visit: Payer: Self-pay | Admitting: Internal Medicine

## 2022-06-12 ENCOUNTER — Other Ambulatory Visit: Payer: Self-pay

## 2022-06-12 MED ORDER — OMEPRAZOLE 40 MG PO CPDR
40.0000 mg | DELAYED_RELEASE_CAPSULE | Freq: Two times a day (BID) | ORAL | 3 refills | Status: DC
  Filled 2022-06-12: qty 60, 30d supply, fill #0
  Filled 2022-06-26: qty 180, 90d supply, fill #0
  Filled 2022-06-30 – 2022-07-01 (×3): qty 60, 30d supply, fill #0
  Filled 2022-08-12: qty 60, 30d supply, fill #1

## 2022-06-12 NOTE — Progress Notes (Signed)
06/12/2022 Name: [name] MRN: [id number] DOB: [birth year]  Chief Complaint  Patient presents with   Medication Management    [name] is a 72 y.o. year old female who presented for a telephone visit.   They were referred to the pharmacist by their PCP for assistance in managing complex medication management.   Subjective:  Care Team: Primary Care Provider: Miguel Aschoff, MD ; Next Scheduled Visit: 06/26/22  Medication Access/Adherence  Current Pharmacy:  [facility] [address] [place] [address] Phone: [phone] Fax: [phone]  [id number] [place], [place] - [address] AT [facility] [address] [facility] [address] [place] [address] Phone: [phone] Fax: [phone]   Patient reports affordability concerns with their medications: No  Patient reports access/transportation concerns to their pharmacy: No  Patient reports adherence concerns with their medications:  No    Morning: metoprolol, furosemide + potassium, duloxetine, bupropion, omeprazole, cetrizine Afternoon: furosemide, potassium Evening: duloxetine, omeprazole, rosuvastatin  Diabetes:  Current medications: Ozempic 1 mg, Tresiba 44 units daily  Prior medications: Jardiance - yeast infection  Reports she was having hypoglycemic episodes overnight over the past few weeks which was causing her to wake up; her Evaristo Bury dose has been adjusted and this has improved in the past 2 weeks.   Patient denies hypoglycemic s/sx including dizziness, shakiness- but does report some continued sweating overnight.    Hypertension:  Current medications: metoprolol succinate 25 mg daily, furosemide 80 mg daily in the morning, 40 mg daily in the afternoon + potassium mEq 40 mEq in the morning, 20 mEq every afternoon  Hyperlipidemia/ASCVD Risk Reduction  Current lipid lowering medications: rosuvastatin 20 mg  daily  Antiplatelet regimen: none due to warfarin  COPD and allergies:  Current medications: cetirizine 10 mg daily, Trelegy 100/62.5/25 mcg daily, albuterol HFA PRN - using once daily in the evenings   Depression/Anxiety:  Current medications: duloxetine 60 mg twice daily, bupropion XL 150 mg daily; alprazolam 1 mg 1/2-1 tablet PRN; can take 1 QPM for sleep, 1/2 during the day for anxiety   Reports she takes alprazolam 1 mg every evening for sleep; she notes she was sleeping more poorly over the last few days with hypoglycemia, but this has seemed to improve.   Notes CPAP machine initially helped, but has not felt like it helps as much recently. Working on getting supplies from a new DME supplier (had been using [employer], now using [employer] and is waiting on new supplies)  GERD: Current medications: omeprazole 40 mg twice daily  Objective:  Lab Results  Component Value Date   HGBA1C 7.5 (A) 05/01/2022    Lab Results  Component Value Date   CREATININE 1.10 (H) 05/22/2022   BUN 20 05/22/2022   NA 139 05/22/2022   K 5.0 05/22/2022   CL 105 05/22/2022   CO2 26 05/22/2022    Lab Results  Component Value Date   CHOL 102 01/18/2022   HDL 32 (L) 01/18/2022   LDLCALC 40 01/18/2022   TRIG 148 01/18/2022   CHOLHDL 3.2 01/18/2022    Medications Reviewed Today     Reviewed by Alden Hipp, RPH-CPP (Pharmacist) on 06/12/22 at 1123  Med List Status: <None>   Medication Order Taking? Sig Documenting Provider Last Dose Status Informant  albuterol (PROAIR HFA) 108 (90 Base) MCG/ACT inhaler [id number] Yes Inhale 2 puffs into the lungs every 6 (six) hours as needed for  shortness of breath. Miguel Aschoff, MD Taking Active   ALPRAZolam Prudy Feeler) 1 MG tablet [id number] Yes Take 1/2 - 1 tablet (1/2  - 1 mg total) by mouth at bedtime as needed for anxiety or sleep. May take 1/2 tablet during the day if needed for anxiety Miguel Aschoff, MD Taking Active   Blood Glucose  Monitoring Suppl [facility] VERIO REFLECT) w/Device KIT [id number] Yes Use to check blood sugar 1 time a day Earl Lagos, MD Taking Active Self  buPROPion (WELLBUTRIN XL) 150 MG 24 hr tablet [id number] Yes Take 1 tablet (150 mg total) by mouth every morning. Miguel Aschoff, MD Taking Active   cetirizine [facility] ALLERGY) 10 MG tablet [id number] Yes Take 1 tablet (10 mg total) by mouth daily. Miguel Aschoff, MD Taking Active Self  clobetasol ointment (TEMOVATE) 0.05 % [id number] No Apply to affected area(s) 2 times daily as needed for (irritation).  Patient not taking: Reported on 06/12/2022   Miguel Aschoff, MD Not Taking Active Self  Continuous Blood Gluc Sensor (FREESTYLE LIBRE 3 SENSOR) [place] [id number] Yes Place 1 sensor on the skin every 14 days. Use to check glucose 6 times daily as directed Miguel Aschoff, MD Taking Active   DULoxetine (CYMBALTA) 60 MG capsule [id number] Yes Take 1 capsule (60 mg total) by mouth 2 (two) times daily. Miguel Aschoff, MD Taking Active   Fluticasone-Umeclidin-Vilant [facility] ELLIPTA) 100-62.5-25 MCG/ACT AEPB [id number] Yes Inhale 1 puff into the lungs daily. Waymon Budge, MD Taking Active   furosemide (LASIX) 80 MG tablet [id number] Yes Take 1 tablet by mouth every morning AND 1/2 tablet daily in the afternoon.  Patient taking differently: Take 1 tablet  80 mg by mouth every morning AND 1/2 tablet 40 mg  daily in the afternoon.   Laurey Morale, MD Taking Active Self  HYDROcodone-acetaminophen (NORCO/VICODIN) 5-325 MG tablet [id number] Yes Take 1-2 tablets by mouth 3 (three) times daily. Miguel Aschoff, MD Taking Active            Med Note Clearance Coots, Alanda Slim Jun 12, 2022 11:17 AM) 2-3 times daily  insulin degludec (TRESIBA FLEXTOUCH) 100 UNIT/ML FlexTouch Pen [id number] Yes Inject 44 Units into the skin daily. Miguel Aschoff, MD Taking Active   Insulin Pen Needle 32G X 4 MM MISC [id number] Yes Use to inject insulin up  to 4 times a day Rana Snare, DO Taking Active   ipratropium (ATROVENT) 0.03 % nasal spray [id number] Yes Place 2 sprays into the nose 3 (three) times daily. Miguel Aschoff, MD Taking Active   ipratropium-albuterol (DUONEB) 0.5-2.5 (3) MG/3ML SOLN [id number] No Inhale 3 mLs by nebulization every 6 (six) hours as needed (shortness of breath, wheezing).  Patient not taking: Reported on 06/12/2022   Miguel Aschoff, MD Not Taking Active   ketoconazole (NIZORAL) 2 % cream [id number] Yes Apply 1 application topically at bedtime as needed for irritation. Miguel Aschoff, MD Taking Active Self  Lancets Misc. (ACCU-CHEK FASTCLIX LANCET) Andria Rhein [id number] Yes Check your blood 4 times a day dx code 250.00 insulin requiring Doneen Poisson, MD Taking Active Self  lidocaine (LIDODERM) 5 % [id number] Yes Place 1 or 2 patches to painful area of back each day.  Remove & Discard patch within 12 hours or as directed by MD Miguel Aschoff, MD Taking Active   metoprolol succinate (TOPROL XL) 25 MG 24 hr tablet [id number] Yes Take 1 tablet (25 mg total) by mouth daily. Adron Bene,  MD Taking Active   nystatin powder [id number]  Apply topically 3  times daily. Miguel Aschoff, MD  Active Self  omeprazole (PRILOSEC) 40 MG capsule [id number] Yes Take 1 capsule by mouth in the morning and at bedtime. Miguel Aschoff, MD Taking Active Self  ondansetron [facility]) 4 MG tablet [id number] Yes Take 1 tablet (4 mg total) by mouth every 8 (eight) hours as needed for nausea or vomiting. Miguel Aschoff, MD Taking Active   OXYGEN [id number] No Inhale 2 L into the lungs at bedtime.  Patient not taking: Reported on 06/12/2022   [provider] Not Taking Active Self  potassium chloride SA (KLOR-CON M) 20 MEQ tablet [id number] Yes Take 2 tablets by mouth in the morning and 1 tablet in the afternoon. Miguel Aschoff, MD Taking Active   rosuvastatin (CRESTOR) 20 MG tablet [id number] Yes Take 1 tablet  (20 mg total) by mouth at bedtime. Miguel Aschoff, MD Taking Active   Semaglutide, 1 MG/DOSE, 4 MG/3ML Namon Cirri [id number] Yes Inject 1 mg into the skin once a week. Miguel Aschoff, MD Taking Active   senna-docusate [facility] S) 8.6-50 MG tablet [id number] Yes Take 1 to 2 tablets by mouth once or twice a day as needed for constipation Miguel Aschoff, MD Taking Active Self           Med Note Suezanne Cheshire   Fri Aug 23, 2021 10:52 AM)    warfarin (COUMADIN) 2.5 MG tablet [id number] Yes Take 1 tablet by mouth daily unless otherwise instructed. Miguel Aschoff, MD Taking Active            Med Note Clearance Coots, Alanda Slim Jun 12, 2022 11:23 AM) 1/2 tablet 6 days a week, 1 tablet 1 day a week.               Assessment/Plan:   Will collaborate with Pharmacy regarding adherence packaging. Patient not quite due for refills, but pharmacy team will collaborate with patient on next fill dates.   Packages will NOT include warfarin, alprazolam, or hydrocodone  Diabetes: - Currently uncontrolled, though more relaxed goal likely appropriate to reduce risk of hypoglycemia.  - Recommend to continue current regimen. Will look at LibreView reports in ~ 5 days; if there are still episodes of readings in 70s overnight, will collaborate with Dr. [name] to reduce insulin dose.  - Recommend to check glucose continuously using CGM  Hypertension: - Currently controlled - Recommend to continue current regimen at this time  Hyperlipidemia/ASCVD Risk Reduction: - Currently controlled.  - Recommend to continue current regimen at this time  COPD: - Currently uncontrolled. Not interested in tobacco cessation - Will collaborate with [facility] RN to help support patient with DME needs with CPAP.  - Recommend to continue current regimen at this time  Depression/Anxiety: - Currently uncontrolled. PHQ-2 was 0 at last appointment, but today she notes to me that her fatigue and  unwillingness to get out of bed is due to depression. Denies SI/HI. Reports she has never had hobbies because she has never had time/resources for hobbies. - Reviewed behavioral health support strategies including continue to collaborate with LCSW - Patient believes she has been on a higher dose of bupropion many years ago, but could consider trial of 300 mg dose to benefit depression. Daytime drowsiness could be related to depression or ill-fitting CPAP as above.   GERD: - Currently controlled.  - Recommend to continue current regimen at this time  Follow up plan: phone call in ~ 6 weeks  Catie Eppie Gibson, PharmD, BCACP, CPP [facility] [phone]

## 2022-06-12 NOTE — Patient Instructions (Signed)
[name],   It was great talking with you!  I'll relay our call to Dr. [name]. I'll peek into LibreView next week. If you are still having readings in the 70s overnight, we can talk to Dr. [name] about further reducing the [place].   The Pharmacy will call you about your pill packages.   Take care!  Catie Eppie Gibson, PharmD, BCACP, CPP [facility] [phone]

## 2022-06-13 ENCOUNTER — Other Ambulatory Visit: Payer: Self-pay

## 2022-06-13 ENCOUNTER — Ambulatory Visit
Admission: RE | Admit: 2022-06-13 | Discharge: 2022-06-13 | Disposition: A | Discharge status: Home / Self Care | Payer: PPO | Source: Ambulatory Visit | Attending: Internal Medicine | Admitting: Internal Medicine

## 2022-06-13 DIAGNOSIS — I700 Atherosclerosis of aorta: Secondary | ICD-10-CM | POA: Diagnosis not present

## 2022-06-13 DIAGNOSIS — Z87891 Personal history of nicotine dependence: Secondary | ICD-10-CM

## 2022-06-13 DIAGNOSIS — G8929 Other chronic pain: Secondary | ICD-10-CM

## 2022-06-13 DIAGNOSIS — F17210 Nicotine dependence, cigarettes, uncomplicated: Secondary | ICD-10-CM

## 2022-06-13 DIAGNOSIS — Z122 Encounter for screening for malignant neoplasm of respiratory organs: Secondary | ICD-10-CM | POA: Diagnosis not present

## 2022-06-13 DIAGNOSIS — J439 Emphysema, unspecified: Secondary | ICD-10-CM | POA: Diagnosis not present

## 2022-06-16 ENCOUNTER — Other Ambulatory Visit: Payer: Self-pay

## 2022-06-16 DIAGNOSIS — Z7901 Long term (current) use of anticoagulants: Secondary | ICD-10-CM | POA: Diagnosis not present

## 2022-06-16 DIAGNOSIS — I480 Paroxysmal atrial fibrillation: Secondary | ICD-10-CM | POA: Diagnosis not present

## 2022-06-16 NOTE — Telephone Encounter (Signed)
INTERNAL MEDICINE TEACHING ATTENDING ADDENDUM   I agree with these recommendations regarding anticoagulation management.   Deisha Stull, MD  

## 2022-06-16 NOTE — Telephone Encounter (Cosign Needed)
Patient reports value of PST FS POC INR on 10 milligrams warfarin per week as: Su-2.5mg M-1.25mg T-1.25mg W-1.25mg Th-1.25mg F-1.25mg Sa-1.25mg  = 1.8 (target range 2.0 - 2.5). Was increased to 11.25 milligrams per week as:  Su-2.5mg M-2.5mg T-1.25mg W-1.25mg Th-1.25mg F-1.25mg Sa-1.25mg   No signs/symptoms of bleeding or embolic events. No missed doses she states.  No new medications.

## 2022-06-18 ENCOUNTER — Other Ambulatory Visit: Payer: Self-pay | Admitting: Acute Care

## 2022-06-18 DIAGNOSIS — Z122 Encounter for screening for malignant neoplasm of respiratory organs: Secondary | ICD-10-CM

## 2022-06-18 DIAGNOSIS — Z87891 Personal history of nicotine dependence: Secondary | ICD-10-CM

## 2022-06-18 DIAGNOSIS — F17210 Nicotine dependence, cigarettes, uncomplicated: Secondary | ICD-10-CM

## 2022-06-19 ENCOUNTER — Ambulatory Visit: Payer: Self-pay | Admitting: *Deleted

## 2022-06-19 ENCOUNTER — Telehealth: Payer: Self-pay | Admitting: *Deleted

## 2022-06-19 DIAGNOSIS — E1140 Type 2 diabetes mellitus with diabetic neuropathy, unspecified: Secondary | ICD-10-CM | POA: Diagnosis not present

## 2022-06-19 DIAGNOSIS — D649 Anemia, unspecified: Secondary | ICD-10-CM | POA: Diagnosis not present

## 2022-06-19 DIAGNOSIS — I480 Paroxysmal atrial fibrillation: Secondary | ICD-10-CM | POA: Diagnosis not present

## 2022-06-19 DIAGNOSIS — Z794 Long term (current) use of insulin: Secondary | ICD-10-CM | POA: Diagnosis not present

## 2022-06-19 DIAGNOSIS — E1151 Type 2 diabetes mellitus with diabetic peripheral angiopathy without gangrene: Secondary | ICD-10-CM | POA: Diagnosis not present

## 2022-06-19 DIAGNOSIS — R2689 Other abnormalities of gait and mobility: Secondary | ICD-10-CM | POA: Diagnosis not present

## 2022-06-19 DIAGNOSIS — Z7985 Long-term (current) use of injectable non-insulin antidiabetic drugs: Secondary | ICD-10-CM | POA: Diagnosis not present

## 2022-06-19 DIAGNOSIS — Z7901 Long term (current) use of anticoagulants: Secondary | ICD-10-CM | POA: Diagnosis not present

## 2022-06-19 DIAGNOSIS — I739 Peripheral vascular disease, unspecified: Secondary | ICD-10-CM | POA: Diagnosis not present

## 2022-06-19 DIAGNOSIS — G3184 Mild cognitive impairment, so stated: Secondary | ICD-10-CM | POA: Diagnosis not present

## 2022-06-19 NOTE — Patient Instructions (Signed)
Visit Information  Thank you for taking time to visit with me today. Please don't hesitate to contact me if I can be of assistance to you.   Following are the goals we discussed today:   Goals Addressed             This Visit's Progress    Provide support, resources and mental health support       Activities and task to complete in order to accomplish goals.    Call [phone] anytime 24/7 for mental health crisis support Positive self talk Try listening to music/tv, coloring or other activities to help distract you from feelings of worry, sadness, etc. Consider counseling support Consider talking with PCP about RX changes for better treatment of depression Follow up with [facility]  Continue with  HH OT and PT  Consider participating in activities outside the home- Sr Center,etc CSW inquired [employer] for update on application and will update you Look into the [employer] program - sounds promising for you to get some help/support          Our next appointment is by telephone on 06/19/22    Please call the care guide team at [phone] if you need to cancel or reschedule your appointment.   If you are experiencing a Mental Health or Behavioral Health Crisis or need someone to talk to, please call the Suicide and Crisis Lifeline: 988 call 911   The patient verbalized understanding of instructions, educational materials, and care plan provided today and DECLINED offer to receive copy of patient instructions, educational materials, and care plan.   Telephone follow up appointment with care management team member scheduled for: 06/19/22  Reece Levy, MSW, LCSW Clinical Social Worker [facility] [employer] [facility] [phone]

## 2022-06-19 NOTE — Patient Outreach (Signed)
  Care Coordination   Follow Up Visit Note   Late Entry- 06/18/2022  Name: [name] MRN: [id number] DOB: [birth year]  [name] is a 72 y.o. year old female who sees [name], [name], MD for primary care. I spoke with  [name] by phone today.  What matters to the patients health and wellness today?  Not having a good day- had "bad dreams; not nightmares" and feeling depressed.    Goals Addressed             This Visit's Progress    Provide support, resources and mental health support       Activities and task to complete in order to accomplish goals.    Call [phone] anytime 24/7 for mental health crisis support Positive self talk Try listening to music/tv, coloring or other activities to help distract you from feelings of worry, sadness, etc. Consider counseling support Consider talking with PCP about RX changes for better treatment of depression Follow up with [facility]  Continue with  HH OT and PT  Consider participating in activities outside the home- [facility],etc CSW inquired [employer] for update on application and will update you Look into the [employer] PAL program - sounds promising for you to get some help/support          SDOH assessments and interventions completed:  Yes     Care Coordination Interventions:  Yes, provided   Follow  Interventions Today    Flowsheet Row Most Recent Value  Mental Health Interventions   Mental Health Discussed/Reviewed Mental Health Discussed, Mental Health Reviewed, Coping Strategies, Depression, Other  [Pt called CSW to share she was having a hard day- "had bad dreams" and feeling rather down/depressed, CSW offered support and interventions to help with her feelings. CSW reminded pt to call [phone] or 911 if needed.]      up plan: Follow up call scheduled for 06/26/22    Encounter Outcome:  Pt. Visit Completed

## 2022-06-19 NOTE — Patient Instructions (Signed)
Visit Information  Thank you for taking time to visit with me today. Please don't hesitate to contact me if I can be of assistance to you.   Following are the goals we discussed today:   Goals Addressed             This Visit's Progress    Provide support, resources and mental health support       Activities and task to complete in order to accomplish goals.    Glad you rested well and feeling better Call 988 anytime 24/7 for mental health crisis support Positive self talk Try listening to music/tv, coloring or other activities to help distract you from feelings of worry, sadness, etc. Consider counseling support Consider talking with PCP about RX changes for better treatment of depression Follow up with [facility] (message left for you to call them back) Continue with  HH OT and PT  Consider participating in activities outside the home- Sr Center,etc Consider riding with [employer] now that you have been approved! Follow up with [employer] program - sounds promising for you to get some help/support          Our next appointment is by telephone on 06/26/22    Please call the care guide team at [phone] if you need to cancel or reschedule your appointment.   If you are experiencing a Mental Health or Behavioral Health Crisis or need someone to talk to, please call the Suicide and Crisis Lifeline: 988 call 911   The patient verbalized understanding of instructions, educational materials, and care plan provided today and DECLINED offer to receive copy of patient instructions, educational materials, and care plan.   Telephone follow up appointment with care management team member scheduled for: 06/26/22  Reece Levy, MSW, LCSW Clinical Social Worker [employer] [facility] [phone]

## 2022-06-19 NOTE — Patient Outreach (Signed)
  Care Coordination   Follow Up Visit Note   06/19/2022 Name: [name] MRN: [id number] DOB: 09-03-1950  [name] is a 72 y.o. year old female who sees [name], [name], MD for primary care. I spoke with  [name] by phone today.  What matters to the patients health and wellness today?  Feeling better today (depression wise); daughter in hospital now,    Goals Addressed             This Visit's Progress    Provide support, resources and mental health support       Activities and task to complete in order to accomplish goals.    Glad you rested well and feeling better Call 988 anytime 24/7 for mental health crisis support Positive self talk Try listening to music/tv, coloring or other activities to help distract you from feelings of worry, sadness, etc. Consider counseling support Consider talking with PCP about RX changes for better treatment of depression Follow up with [facility] (message left for you to call them back) Continue with  HH OT and PT  Consider participating in activities outside the home- Sr Center,etc Consider riding with [employer] now that you have been approved! Follow up with [employer] program - sounds promising for you to get some help/support          SDOH assessments and interventions completed:  Yes     Care Coordination Interventions:  Yes, provided  Interventions Today    Flowsheet Row Most Recent Value  Chronic Disease   Chronic disease during today's visit Diabetes, Other  [depression]  General Interventions   General Interventions Discussed/Reviewed Community Resources  [Pt encouraged to call [employer] and [facility] back as she reports they have left her messages]  Mental Health Interventions   Mental Health Discussed/Reviewed Mental Health Discussed, Mental Health Reviewed, Coping Strategies, Depression, Other  [Pt reports feeling better/less depression today. Worried about her daughter  who is in hospital]  Nutrition Interventions   Nutrition Discussed/Reviewed Nutrition Discussed  [Pt reports she has cancelled/paused her Meals on Wheels because of lots of conflicts with timing of delivery and visits/appointments.]       Follow up plan: Follow up call scheduled for 06/26/22    Encounter Outcome:  Pt. Visit Completed

## 2022-06-20 ENCOUNTER — Other Ambulatory Visit: Payer: Self-pay

## 2022-06-20 ENCOUNTER — Other Ambulatory Visit: Payer: Self-pay | Admitting: Internal Medicine

## 2022-06-20 ENCOUNTER — Other Ambulatory Visit: Payer: Self-pay | Admitting: Specialist

## 2022-06-20 ENCOUNTER — Other Ambulatory Visit (HOSPITAL_COMMUNITY): Payer: Self-pay

## 2022-06-20 DIAGNOSIS — J310 Chronic rhinitis: Secondary | ICD-10-CM

## 2022-06-20 MED ORDER — IPRATROPIUM BROMIDE 0.03 % NA SOLN
2.0000 | Freq: Three times a day (TID) | NASAL | 2 refills | Status: DC
Start: 2022-06-20 — End: 2023-02-18
  Filled 2022-06-20 – 2022-07-11 (×2): qty 30, 50d supply, fill #0
  Filled 2022-08-28: qty 30, 50d supply, fill #1
  Filled 2022-12-23: qty 30, 50d supply, fill #2
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
## 2022-06-20 NOTE — Telephone Encounter (Signed)
Next appt scheduled 5/16 with PCP. 

## 2022-06-23 ENCOUNTER — Telehealth: Payer: Self-pay | Admitting: Pharmacist

## 2022-06-23 NOTE — Telephone Encounter (Signed)
INTERNAL MEDICINE TEACHING ATTENDING ADDENDUM   I agree with these recommendations regarding anticoagulation management.   Gwynn Chalker, MD  

## 2022-06-23 NOTE — Telephone Encounter (Signed)
Patient texts results of PST FS POC INR today = 1.9 on Su-2.5mg M-2.5mg T-1.25mg W-1.25mg Th-1.25mg F-1.25mg Sa-1.25mg  (11.25mg  total/wk warfarin). Will INCREASE TO Su-2.5mg M-2.5mg T-1.25mg W-1.25mg Th-2.5mg F-1.25mg Sa-1.25mg  (12.5mg  total per wk.)

## 2022-06-26 ENCOUNTER — Encounter: Payer: Self-pay | Admitting: Internal Medicine

## 2022-06-26 ENCOUNTER — Ambulatory Visit (INDEPENDENT_AMBULATORY_CARE_PROVIDER_SITE_OTHER): Payer: PPO | Admitting: Internal Medicine

## 2022-06-26 ENCOUNTER — Other Ambulatory Visit: Payer: Self-pay

## 2022-06-26 VITALS — BP 112/38 | HR 53 | Temp 97.9°F | Ht 62.0 in | Wt 160.0 lb

## 2022-06-26 DIAGNOSIS — M79674 Pain in right toe(s): Secondary | ICD-10-CM | POA: Diagnosis not present

## 2022-06-26 DIAGNOSIS — G3184 Mild cognitive impairment, so stated: Secondary | ICD-10-CM | POA: Diagnosis not present

## 2022-06-26 DIAGNOSIS — G4734 Idiopathic sleep related nonobstructive alveolar hypoventilation: Secondary | ICD-10-CM | POA: Diagnosis not present

## 2022-06-26 NOTE — Patient Instructions (Signed)
Apply for Medicaid or else!!!

## 2022-06-26 NOTE — Assessment & Plan Note (Addendum)
See note for history. MoCA today: 1/5 lost in executive/vs (cube copy), 1/3 lost in naming (hippo rather tha rhino), 1 point lost in serial 7s, 1 point lost in verbal fluency, 3/5 lost in memory.  12 years education +1 pt = 24/30 Diagnosis MCI, amnestic, multifactorial, with irregular sleep/wake cycle and depression and anxiety causing difficulty with attention and concentration; Nocturnal hypoxia and CVD as impairments to optimal cognitive functioning; and centrally active medications directly interfering with cognitive function.  We discussed this.  Her depression is very symptomatic, driven primarily by negative family relationships.  She feels that her mood would improve if she could move to a care facility where she could meet more people and not have to worry about basic needs.  She is currently taking max dose SNRI (dual indication chronic pain) and bupropion 150 mg daily.  An increase in dose was suggested by [facility] Clinical Pharmacist, but [name] recalls being on a higher dose in the past and didn't tolerate it - she doesn't want to go up on dose, and doesn't want to change her meds at this time.  She has successfully eliminated gabapentin and is now working on weaning off alprazolam. We recall that when she receives her medicines on intended schedule (as in the hospital), she feels much better and is alert and better balanced in gait.  Medicine admin in a care facility would be helpful in this regard as well.  No changes today; we'll get together frequently.

## 2022-06-26 NOTE — Assessment & Plan Note (Addendum)
Tender with downward pressure on great toenail and with pressure against toe tip. Query a nailbed injury based on splinter hemorrhages at margin of distal nail attachment-she does have some distal/medial onycholysis.  Feet generally have chronic tinea. No clear evidence of ingrown toenail and the medial nailfold is not tender.  Advised careful foot protection.  She is sensate to touch.

## 2022-06-26 NOTE — Progress Notes (Signed)
72 year old [name] (chronic pain, major depression, T2DM with complications, COPD with nocturnal hypoxia, PAD among others) is here for close follow-up, today's visit intended to specifically look into her memory complaints.  Brings to my attention a problem with her right great toe which has been painful.  She recently saw podiatrist who trimmed her nails and no cause of pain was identified.  She questions if she has an ingrown toenail.  No injury.  Typically wears open toed slippers unless she goes out.  Just at end of visit mentioned episodes of fleeting chest pains  , anterior, usually at night, rare during day, not associated with meals or reflux symptoms. query  Cognition/Mood Mood: Tired, sad, unmotivated, apathetic, getting nothing done.  Appetite: Put MOW on hold - tired of them - doesn't miss them. Eating whatever is in the house or what is brought.  No particular meal schedule.  Lots of candy, junkfood. Sleep:  Chronically poor; difficulty finding comfortable position (sleeps in recliner in her room using CPAP). Sleeps during the day out of boredom and wanting to escape essentially. Cognition:  Forgetfulness has been ongoing for at least a couple of years.  Forgets odd things; inconsistent.  Can be disoriented when awakening from a nap which resolves quickly.  No problems "doing" IADLs or ADLS aside from physical/mobility challenges.  She uses a Statistician.  While not present today, sister [name] has reported in [name]'s presence that she's noticed her being "out of it" and completely forgetting conversations.  Objective BP (!) 112/38 (BP Location: Left Arm, Patient Position: Sitting, Cuff Size: Small)   Pulse (!) 53   Temp 97.9 F (36.6 C) (Oral)   Ht 5\' 2"  (1.575 m)   Wt 160 lb (72.6 kg)   SpO2 97%   BMI 29.26 kg/m  Arrives in transport chair.  Appears fatigued, mood low, but incongruently laughs (this is her usual).  No acute distress.  Feet with very purple  toes today (not unusual) and slightly cool.  Surprisingly I can detect a faint L dp (typically pulseless feet). R great toe medial nail fold and area beneath nail has been painful - some detachment of distal medial nail with what appears to be subungual dark splinter hemorrhages and perhaps bruising. No LE edema.  Voice is chronically hoarse, infrequently has harsh nonproductive cough.    Assessment and plan:  Pain of right great toe Tender with downward pressure on great toenail and with pressure against toe tip. Query a nailbed injury based on splinter hemorrhages at margin of distal nail attachment-she does have some distal/medial onycholysis.  Feet generally have chronic tinea. No clear evidence of ingrown toenail and the medial nailfold is not tender.  Advised careful foot protection.  She is sensate to touch.    Mild cognitive impairment with memory loss See note for history. MoCA today: 1/5 lost in executive/vs (cube copy), 1/3 lost in naming (hippo rather tha rhino), 1 point lost in serial 7s, 1 point lost in verbal fluency, 3/5 lost in memory.  12 years education +1 pt = 24/30 Diagnosis MCI, amnestic, multifactorial, with irregular sleep/wake cycle and depression and anxiety causing difficulty with attention and concentration; Nocturnal hypoxia and CVD as impairments to optimal cognitive functioning; and centrally active medications directly interfering with cognitive function.  We discussed this.  Her depression is very symptomatic, driven primarily by negative family relationships.  She feels that her mood would improve if she could move to a care facility where she  could meet more people and not have to worry about basic needs.  She is currently taking max dose SNRI (dual indication chronic pain) and bupropion 150 mg daily.  An increase in dose was suggested by [facility] Clinical Pharmacist, but [name] recalls being on a higher dose in the past and didn't tolerate it - she doesn't want to go up  on dose, and doesn't want to change her meds at this time.  She has successfully eliminated gabapentin and is now working on weaning off alprazolam. We recall that when she receives her medicines on intended schedule (as in the hospital), she feels much better and is alert and better balanced in gait.  Medicine admin in a care facility would be helpful in this regard as well.  No changes today; we'll get together frequently.    Needs to reschedule vascular, eye, mammogram Apply for Medicaid!!! She will do this online.  Hasn't been able to get herself motivated.

## 2022-06-27 ENCOUNTER — Other Ambulatory Visit: Payer: Self-pay

## 2022-06-27 ENCOUNTER — Telehealth: Payer: Self-pay

## 2022-06-27 NOTE — Patient Outreach (Signed)
Aging Gracefully Program  06/27/2022  Miyeko Caufield 08-01-1950 [id number]   Placed call to patient to make an appointment for RN home visit. Left a message requesting a call back.  Rowe Pavy RN, BSN, Careers adviser for [employer] Mobile: [phone]

## 2022-06-28 ENCOUNTER — Other Ambulatory Visit (HOSPITAL_COMMUNITY): Payer: Self-pay

## 2022-06-29 ENCOUNTER — Telehealth: Payer: Self-pay | Admitting: Student
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
## 2022-06-30 ENCOUNTER — Telehealth: Payer: Self-pay | Admitting: Pharmacist

## 2022-06-30 ENCOUNTER — Other Ambulatory Visit: Payer: Self-pay

## 2022-06-30 ENCOUNTER — Telehealth: Payer: Self-pay

## 2022-06-30 ENCOUNTER — Other Ambulatory Visit (HOSPITAL_COMMUNITY): Payer: Self-pay

## 2022-06-30 DIAGNOSIS — G8929 Other chronic pain: Secondary | ICD-10-CM

## 2022-06-30 NOTE — Telephone Encounter (Signed)
INTERNAL MEDICINE TEACHING ATTENDING ADDENDUM   I agree with these recommendations regarding anticoagulation management.   Shonteria Abeln, MD  

## 2022-06-30 NOTE — Telephone Encounter (Signed)
Patient reports value of PST FS POC INR determination just collected = 3.7 (Target INR range 2.0 - 2.5) on 12. 5 milligrams of warfarin/week. Will reduce to 10 milligrams warfarin/wk as:Su-2.5mg M-0mg T-1.25mg W-1.25mg Th-2.5mg F-1.25mg Sa-1.25mg .  using the 2.5mg  strength, green-colored warfarin tablets, where 2.5mg  = 1 tablet; a dose of 1.25mg  = 1/2 of the 2.5mg  tablet. Repeat INR in one week, 27-MAY-24.

## 2022-06-30 NOTE — Patient Outreach (Signed)
Aging Gracefully Program  06/30/2022  [name] 04-Nov-1950 [id number]   Incoming call from patient.  Reviewed reason for call. Offered AG home visit for 07/01/2022. Patient accepted.    Rowe Pavy RN, BSN, Careers adviser for [employer] Mobile: [phone]

## 2022-07-01 ENCOUNTER — Other Ambulatory Visit: Payer: Self-pay

## 2022-07-01 ENCOUNTER — Other Ambulatory Visit (HOSPITAL_COMMUNITY): Payer: Self-pay

## 2022-07-01 ENCOUNTER — Ambulatory Visit: Payer: Self-pay | Admitting: *Deleted

## 2022-07-01 MED ORDER — HYDROCODONE-ACETAMINOPHEN 5-325 MG PO TABS
1.0000 | ORAL_TABLET | Freq: Three times a day (TID) | ORAL | 0 refills | Status: DC
Start: 2022-07-01 — End: 2022-08-07
  Filled 2022-07-01: qty 180, 30d supply, fill #0

## 2022-07-01 NOTE — Patient Instructions (Signed)
Visit Information  Thank you for taking time to visit with me today. Please don't hesitate to contact me if I can be of assistance to you before our next scheduled home appointment.   Goals Addressed             This Visit's Progress    Aging Gracefully RN:       Goal: Patient will report decrease in constipation in the next 160 days.  07/01/2022 Assessment:   Reviewed long history of constipation concerns with patient.  Patient reports that she does not take any thing for her bowels on a regular basis. Reports she may have a bowel movement 2 times in one day and then skip 5-7 days before having another BM.   Interventions:  reviewed causes for constipation.  Encouraged patient to keep log ( she has been doing this).  Encouraged patient to increase water, exercise and fiber. Encouraged patient to take her Senna 1 tablet twice a day and then we will see if she has improvement.  Reviewed importance of eating regular meals.  Plan: next home visit planned for 07/29/2022  Rowe Pavy RN, BSN, CEN RN Case Manager for Aging Gracefully [facility] Mobile: [phone]  CLIENT/RN ACTION PLAN - INCONTINENCE (Leak urine or bowel)  Registered Nurse:  Rowe Pavy RN Date:07/01/2022  Client Name:[name] Client ID:    Target Area:  CONSTIPATION   Why Problem May Occur: LACK OF EXERCISE, MEDICATION SIDE EFFECTS, LIMITED MOBILITY, LACK OF FIBER   Target Goal: Patient will report decrease in constipation in the 160 days.   STRATEGIES Coping Strategies Ideas:  Prevent Constipation Full belly presses the bladder and blocks urination. Drink more water. Eat more fruit, vegetables, high-fiber cereal.   Medications Take medications as prescribed  Plan Ahead Keep BM log  Diet Eat a fiber rich diet Stay hydrated   Mobility Stay active .  Other    Prevention Strategies Ideas  Recognize when you feel constipated.  If medications as not working. Try prunes and prune  juice Call MD and do not go longer than 5 days between bowel movements.                      PRACTICE It is important to practice the strategies so we can determine if they will be effective in helping to reach the goal.    Follow these specific recommendations:        If strategy does not work the first time, try it again.  We may make some changes over the next few sessions.     Insert Signature Line  Rowe Pavy RN, BSN, [place] RN Case Production designer, theatre/television/film for Aging Gracefully [facility] Mobile: [phone]            Our next appointment is on 0618/2024 at 0930  If you are experiencing a Mental Health or Behavioral Health Crisis or need someone to talk to, please call the Suicide and Crisis Lifeline: 988 call the [place] National Suicide Prevention Lifeline: 417-469-4526 or TTY: 716-189-1499 TTY 512-803-2217) to talk to a trained counselor call 1-800-273-TALK (toll free, 24 hour hotline) go to [facility] [address] [place] [phone]) call 911   The patient verbalized understanding of instructions, educational materials, and care plan provided today and agreed to receive a mailed copy of patient instructions, educational materials, and care plan.   Rowe Pavy RN, BSN, Careers adviser for [employer]:  [phone]     

## 2022-07-01 NOTE — Patient Outreach (Signed)
  Care Coordination   Follow Up Visit Note   07/01/2022 Name: [name] MRN: [id number] DOB: 18-Oct-1950  [name] is a 72 y.o. year old female who sees [name], [name], MD for primary care. I spoke with  [name] by phone today.  What matters to the patients health and wellness today? "My kids came yesterday and did some cleaning out and planting some flowers". Pt pleased with the progress on her bedroom especially-     Goals Addressed             This Visit's Progress    Provide support, resources and mental health support       Activities and task to complete in order to accomplish goals.    Glad you enjoyed family time yesterday and some help with getting your bedroom, bathroom and kitchen area cleaned out some- keep at it!  Call [phone] anytime 24/7 for mental health crisis support Positive self talk Try listening to music/tv, coloring or other activities to help distract you from feelings of worry, sadness, etc. Consider counseling support Consider talking with PCP about RX changes for better treatment of depression RNCM visit for today with [facility]  Medicaid application to be done (per pt, someone is helping with applications for Medicaid and Food Stamps) Consider participating in activities outside the home- [facility],etc Consider riding with [facility] now that you have been approved! Follow up with [employer] program - sounds promising for you to get some help/support           SDOH assessments and interventions completed:  Yes     Care Coordination Interventions:  Yes, provided  Interventions Today    Flowsheet Row Most Recent Value  Chronic Disease   Chronic disease during today's visit Other  [depression]  General Interventions   General Interventions Discussed/Reviewed [employer] Medicaid, [employer] program, [facility] and other local resources to consider]  Mental Health Interventions   Mental  Health Discussed/Reviewed Mental Health Discussed, Coping Strategies, Depression, Other  [CSW discussed with pt again the benefit of connecting with a Counselor for therapy- wants to wait and get the transportation all set up first.]       Follow up plan: Follow up call scheduled for 07/11/22    Encounter Outcome:  Pt. Visit Completed
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
## 2022-07-01 NOTE — Patient Instructions (Signed)
Visit Information  Thank you for taking time to visit with me today. Please don't hesitate to contact me if I can be of assistance to you.   Following are the goals we discussed today:   Goals Addressed             This Visit's Progress    Provide support, resources and mental health support       Activities and task to complete in order to accomplish goals.    Glad you enjoyed family time yesterday and some help with getting your bedroom, bathroom and kitchen area cleaned out some- keep at it!  Call 988 anytime 24/7 for mental health crisis support Positive self talk Try listening to music/tv, coloring or other activities to help distract you from feelings of worry, sadness, etc. Consider counseling support Consider talking with PCP about RX changes for better treatment of depression RNCM visit for today with [facility]  Medicaid application to be done (per pt, someone is helping with applications for Medicaid and Food Stamps) Consider participating in activities outside the home- [facility],etc Consider riding with [facility] now that you have been approved! Follow up with [employer] program - sounds promising for you to get some help/support           Our next appointment is by telephone on 07/11/22   Please call the care guide team at [phone] if you need to cancel or reschedule your appointment.   If you are experiencing a Mental Health or Behavioral Health Crisis or need someone to talk to, please call the Suicide and Crisis Lifeline: 988 call 911   The patient verbalized understanding of instructions, educational materials, and care plan provided today and DECLINED offer to receive copy of patient instructions, educational materials, and care plan.   Telephone follow up appointment with care management team member scheduled for:07/11/22  Reece Levy, MSW, LCSW Clinical Social Worker [facility] [phone]

## 2022-07-07 ENCOUNTER — Telehealth: Payer: Self-pay | Admitting: Pharmacist

## 2022-07-07 NOTE — Telephone Encounter (Signed)
Patient provided results of PST FS POC INR results today = 3.5 on 10mg  warfarin/week. Advised to OMIT todays dose AND tomorrows dose. Resume on Wednesday 29-MAY with 2.5mg ; all other days 1/2 x 2.5mg  [1.25mg  dose], repeat PST FS INR Monday 3-JUN-24. Denies any symptoms or signs of bleeding, no new medications.

## 2022-07-12 ENCOUNTER — Other Ambulatory Visit (HOSPITAL_COMMUNITY): Payer: Self-pay

## 2022-07-12 ENCOUNTER — Other Ambulatory Visit (HOSPITAL_BASED_OUTPATIENT_CLINIC_OR_DEPARTMENT_OTHER): Payer: Self-pay

## 2022-07-12 DIAGNOSIS — G4733 Obstructive sleep apnea (adult) (pediatric): Secondary | ICD-10-CM | POA: Diagnosis not present

## 2022-07-14 ENCOUNTER — Other Ambulatory Visit: Payer: Self-pay

## 2022-07-14 ENCOUNTER — Encounter: Payer: Self-pay | Admitting: Pharmacist

## 2022-07-14 ENCOUNTER — Telehealth: Payer: Self-pay | Admitting: Pharmacist

## 2022-07-14 ENCOUNTER — Other Ambulatory Visit (HOSPITAL_COMMUNITY): Payer: Self-pay

## 2022-07-14 DIAGNOSIS — I480 Paroxysmal atrial fibrillation: Secondary | ICD-10-CM | POA: Diagnosis not present

## 2022-07-14 DIAGNOSIS — Z7901 Long term (current) use of anticoagulants: Secondary | ICD-10-CM | POA: Diagnosis not present

## 2022-07-14 NOTE — Telephone Encounter (Signed)
Patient reports INR from PST FS POC INR device in the home setting of 2.0 (Target INR range 2.0 - 2.50) on 8.75 milligrams of warfarin per WEEK. Will increase to 10 milligrams per week as: 2.5 milligrams on Wednesday, Friday and Sunday; 1.25mg  on Thurs/Sat  and NO warfarin on Monday/Tuesday. Reports no new medications, no missed doses other than those instructed to omit (as above). No bleeding or symptoms/signs of embolic disease. Repeat patient self-testing, fingerstick INR in one week on 10-JUN-24.

## 2022-07-15 ENCOUNTER — Other Ambulatory Visit: Payer: Self-pay

## 2022-07-15 ENCOUNTER — Ambulatory Visit: Payer: Self-pay | Admitting: *Deleted

## 2022-07-15 ENCOUNTER — Other Ambulatory Visit (HOSPITAL_COMMUNITY): Payer: Self-pay

## 2022-07-15 NOTE — Patient Outreach (Signed)
  Care Coordination   Follow Up Visit Note   07/15/2022 Name: [name] MRN: [id number] DOB: 1950/08/14  [name] is a 72 y.o. year old female who sees [name], Kathryn Ar, MD for primary care. I spoke with  [name] by phone today.  What matters to the patients health and wellness today?  Having some pain this AM. Questions about CPAP vs BiPAP    Goals Addressed             This Visit's Progress    Provide support, resources and mental health support       Activities and task to complete in order to accomplish goals.    [employer] visit today to assess home and possible repairs/adaptations they can assist with  Follow up with [employer] rep regarding the CPAP vs BiPap Call [phone] anytime 24/7 for mental health crisis support Positive self talk Try listening to music/tv, coloring or other activities to help distract you from feelings of worry, sadness, etc. Consider counseling support Consider talking with PCP about RX changes for better treatment of depression RNCM following from [facility]  Medicaid application to be done (per pt, someone is helping with applications for Medicaid and Food Stamps) Consider participating in activities outside the home- [facility],etc Consider riding with [facility] now that you have been approved! Follow up with [employer] program - sounds promising for you to get some help/support           SDOH assessments and interventions completed:  Yes     Care Coordination Interventions:  Yes, provided  Interventions Today    Flowsheet Row Most Recent Value  Chronic Disease   Chronic disease during today's visit Other  [depression/pain]  General Interventions   General Interventions Discussed/Reviewed General Interventions Discussed, Community Resources  [Aging [facility]fully RNCM, [employer], [facility]]  Mental Health Interventions   Mental Health Discussed/Reviewed Coping Strategies,  Depression, Mental Health Discussed  Safety Interventions   Safety Discussed/Reviewed Safety Discussed       Follow up plan: Follow up call scheduled for 07/24/22    Encounter Outcome:  Pt. Visit Completed

## 2022-07-15 NOTE — Patient Instructions (Signed)
Visit Information  Thank you for taking time to visit with me today. Please don't hesitate to contact me if I can be of assistance to you.   Following are the goals we discussed today:   Goals Addressed             This Visit's Progress    Provide support, resources and mental health support       Activities and task to complete in order to accomplish goals.    [employer] visit today to assess home and possible repairs/adaptations they can assist with  Follow up with [employer] rep regarding the CPAP vs BiPap Call [phone] anytime 24/7 for mental health crisis support Positive self talk Try listening to music/tv, coloring or other activities to help distract you from feelings of worry, sadness, etc. Consider counseling support Consider talking with PCP about RX changes for better treatment of depression RNCM following from Aging [facility]  Medicaid application to be done (per pt, someone is helping with applications for Medicaid and Food Stamps) Consider participating in activities outside the home- [facility],etc Consider riding with [facility] now that you have been approved! Follow up with [employer] program - sounds promising for you to get some help/support           Our next appointment is by telephone on 07/24/22    Please call the care guide team at [phone] if you need to cancel or reschedule your appointment.   If you are experiencing a Mental Health or Behavioral Health Crisis or need someone to talk to, please call the Suicide and Crisis Lifeline: 988 call 911   Patient verbalizes understanding of instructions and care plan provided today and agrees to view in MyChart. Active MyChart status and patient understanding of how to access instructions and care plan via MyChart confirmed with patient.     Telephone follow up appointment with care management team member scheduled for:07/24/22 Reece Levy, MSW, LCSW Clinical Social Worker [facility] [employer] [phone]

## 2022-07-17 DIAGNOSIS — G4733 Obstructive sleep apnea (adult) (pediatric): Secondary | ICD-10-CM | POA: Diagnosis not present

## 2022-07-21 NOTE — Telephone Encounter (Signed)
Patient reports PST FS POC INR results = 2.7 (target range 2.0 - 2.5) on 10 mg total warfarin per week. Decreased to 8.75 mg total warfarin per week as: 0 tablets/mg on Monday, Tuesday. 2.5mg  on Wednesday/Sunday; 1.25mg  on Th/Fri. Repeat INR 17, June. No bleeding reported. No new medications.

## 2022-07-22 ENCOUNTER — Other Ambulatory Visit (HOSPITAL_COMMUNITY): Payer: Self-pay

## 2022-07-23 ENCOUNTER — Other Ambulatory Visit: Payer: Self-pay

## 2022-07-23 ENCOUNTER — Other Ambulatory Visit (HOSPITAL_COMMUNITY): Payer: Self-pay

## 2022-07-23 ENCOUNTER — Encounter: Payer: PPO | Admitting: Student

## 2022-07-23 ENCOUNTER — Telehealth: Payer: Self-pay

## 2022-07-23 NOTE — Telephone Encounter (Signed)
RTC from patient .  Will not come today.  Will keep appointment for tomorrow with Dr. [name].

## 2022-07-23 NOTE — Telephone Encounter (Signed)
Requesting to speak with a nurse about blister on the left leg. Please call pt back.
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
## 2022-07-24 ENCOUNTER — Ambulatory Visit (INDEPENDENT_AMBULATORY_CARE_PROVIDER_SITE_OTHER): Payer: PPO | Admitting: Internal Medicine

## 2022-07-24 ENCOUNTER — Ambulatory Visit: Payer: Self-pay | Admitting: *Deleted

## 2022-07-24 ENCOUNTER — Other Ambulatory Visit: Payer: PPO | Admitting: Pharmacist

## 2022-07-24 VITALS — BP 122/85 | HR 67 | Temp 97.8°F | Ht 62.0 in | Wt 158.6 lb

## 2022-07-24 DIAGNOSIS — R52 Pain, unspecified: Secondary | ICD-10-CM

## 2022-07-24 DIAGNOSIS — Z7985 Long-term (current) use of injectable non-insulin antidiabetic drugs: Secondary | ICD-10-CM | POA: Diagnosis not present

## 2022-07-24 DIAGNOSIS — M5440 Lumbago with sciatica, unspecified side: Secondary | ICD-10-CM | POA: Diagnosis not present

## 2022-07-24 DIAGNOSIS — G8929 Other chronic pain: Secondary | ICD-10-CM

## 2022-07-24 DIAGNOSIS — M79675 Pain in left toe(s): Secondary | ICD-10-CM | POA: Diagnosis not present

## 2022-07-24 DIAGNOSIS — F17210 Nicotine dependence, cigarettes, uncomplicated: Secondary | ICD-10-CM | POA: Diagnosis not present

## 2022-07-24 DIAGNOSIS — K5909 Other constipation: Secondary | ICD-10-CM | POA: Diagnosis not present

## 2022-07-24 DIAGNOSIS — E1122 Type 2 diabetes mellitus with diabetic chronic kidney disease: Secondary | ICD-10-CM | POA: Diagnosis not present

## 2022-07-24 DIAGNOSIS — N1831 Chronic kidney disease, stage 3a: Secondary | ICD-10-CM | POA: Diagnosis not present

## 2022-07-24 DIAGNOSIS — R5381 Other malaise: Secondary | ICD-10-CM

## 2022-07-24 DIAGNOSIS — G4734 Idiopathic sleep related nonobstructive alveolar hypoventilation: Secondary | ICD-10-CM

## 2022-07-24 DIAGNOSIS — J449 Chronic obstructive pulmonary disease, unspecified: Secondary | ICD-10-CM

## 2022-07-24 DIAGNOSIS — D509 Iron deficiency anemia, unspecified: Secondary | ICD-10-CM | POA: Diagnosis not present

## 2022-07-24 DIAGNOSIS — M79674 Pain in right toe(s): Secondary | ICD-10-CM | POA: Diagnosis not present

## 2022-07-24 DIAGNOSIS — F322 Major depressive disorder, single episode, severe without psychotic features: Secondary | ICD-10-CM

## 2022-07-24 DIAGNOSIS — E118 Type 2 diabetes mellitus with unspecified complications: Secondary | ICD-10-CM

## 2022-07-24 LAB — POCT GLYCOSYLATED HEMOGLOBIN (HGB A1C): Hemoglobin A1C: 7.4 % — AB (ref 4.0–5.6)

## 2022-07-24 MED ORDER — KETOROLAC TROMETHAMINE 30 MG/ML IJ SOLN
30.0000 mg | Freq: Once | INTRAMUSCULAR | Status: AC
Start: 2022-07-24 — End: 2022-07-24
  Administered 2022-07-24: 30 mg via INTRAMUSCULAR

## 2022-07-24 NOTE — Progress Notes (Signed)
Routine close f/u for 72 yo [name] who is living with many chronic conditions and has been experiencing a decline in her functional ability.  This was a joint visit with [facility] MSW [name]. [facility] has been working with [name] to provide resources to support ongoing independent living.   [facility] is helping with home modifications.  Working toward having mailbox moved from curb to Writer (Letter of support provided today) Medicaid application hasn't occurred.  [name] was under the impression that someone would be helping her with this. [name] reminds her that [facility] has a walk in application appt option. Meals:  will be resuming MOW.  Grocery delivery services discussed.  She has been utilizing [employer] "but I don't pay for it" Transportation: Uncomfortable with idea of utilizing SCAT - does not wish to be alone when leaving the house, feels more comfortable if accompanied, as by her sister (with whom she's used to going places).  [employer] PALS may be an option for providing up to 30 hrs/mo of home assistance - per [name]. Many problems with diagnosis and interventions for breathing and oxygen and whether she needs CPAP, BiPAP. When [name]'s 02 system had malfunctioned and she was displeased with the current agency, I offered to refer her to [facility], new local respiratory HH company.  Despite no order for a new mask, unfortunately she was told that she needed a different mask and a new sleep study or something of that nature - she was also informed by [facility] that she needed a new pulmonologist.  She has now been without oxygen for longer than I was aware.   Medical concerns: Blisters developed on her L mid leg a couple of days ago, no known bite or burn or exposure to new products or toxic plants.  Improving.  Medial R great toe nailfold pain improved after she "worked on it".  L medial great toenailfold now also hurting.   Constipation is severe- taking  Senakot S once a day ONLY - should be taking twice a day and has not used her miralax.  BP 122/85 (BP Location: Left Arm, Patient Position: Sitting, Cuff Size: Small)   Pulse 67   Temp 97.8 F (36.6 C) (Oral)   Ht 5\' 2"  (1.575 m)   Wt 158 lb 9.6 oz (71.9 kg)   SpO2 98%   BMI 29.01 kg/m   Assessment and Plan:  Due for mammogram and appts with ophthalmologist, vascular, and cardiology.  COPD mixed type (HCC) More difficulty with breathing particularly at night, taking her rescue inhaler.  Awakens with more productive cough.  Desires a new pulmonologist.  On Trilogy which had been working well.  Type 2 diabetes mellitus with complications (HCC) Due for A1c 08/2022. Weight on increased Ozempic (now 1 mg/wk) is relatively stable since 02/2022, with few pounds variation. Diet is poor. She will be resuming MOW but doesn't care for the meals.   Pain of great toes medial nail beds of both feet R great toe medial nailbed improved after "working on it" - no change in distal-medial onycholysis.  Similar problem happening on L great toe nailbed.  NO evidence of ingrown nail, onychomycosis and onycholysis probably playing a role, encouraged to f/u with podiatrist.   Multifactorial functional impairment Efforts to support her in the home ongoing with help of [facility] MSW.  Medicaid application hasn't been initiated (needed in order to proceed with ALF placement, her desire), though her assets may disqualify her.  Chronic constipation Opioids most significant contributor.  She has Senokot but has been taking only one a day, and has miralax but hasn't taken it at all.  Discussed need to increase senna to 1 (or 2 if needed) twice a day and to begin Miralax daily.  She agreed.    Generalized pain Severe pain "all over". Multiple known sources of chronic pain.  Ketoralac injection today. Watching renal fxn.   Severe major depression (HCC) Symptoms continue.  She is despondent over her situation in life,  including her declining functional ability and independence and her suboptimal social support.  She is on max dose of duloxetine (dual indication chronic pain) and on buproprion 150 mg (she felt poorly on higher dose in past and does not desire dose increase).  Given her multimorbidity and significant polypharmacy (many of which are centrally acting), there are few pharmacologic options.  She would benefit most from behavioral health therapy.  She hasn't been keen on establishing with a new therapist after her former provider left our practice over a year ago. We'll continue to support her.  She is not suicidal.
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
## 2022-07-24 NOTE — Assessment & Plan Note (Signed)
More difficulty with breathing particularly at night, taking her rescue inhaler.  Awakens with more productive cough.  Desires a new pulmonologist.  On Trilogy which had been working well.

## 2022-07-24 NOTE — Patient Instructions (Signed)
Visit Information  Thank you for taking time to visit with me today. Please don't hesitate to contact me if I can be of assistance to you.   Following are the goals we discussed today:   Goals Addressed             This Visit's Progress    Provide support, resources and mental health support       Activities and task to complete in order to accomplish goals.    [employer] assisting with home modifications/repairs Follow up with DSS for Medicaid application (Go to DSS [employer] Assistance [address] [place]. [address]/Monday-Friday 8am-5pm) Call 988 anytime 24/7 for mental health crisis support Positive self talk Try listening to music/tv, coloring or other activities to help distract you from feelings of worry, sadness, etc. Consider counseling support Mail the letter from PCP along with your letter to [employer]PS to request door mail dropoff PCP to complete revised SCAT application for hopeful approval of door to door service and not curbside  Consider participating in activities outside the home- [facility],etc Consider riding with SCAT now that you have been approved! Follow up with [employer] PAL program - sounds promising for you to get some help/support           Our next appointment is by telephone on 07/31/22  Please call the care guide team at [phone] if you need to cancel or reschedule your appointment.   If you are experiencing a Mental Health or Behavioral Health Crisis or need someone to talk to, please call the Suicide and Crisis Lifeline: 988 call 911   The patient verbalized understanding of instructions, educational materials, and care plan provided today and DECLINED offer to receive copy of patient instructions, educational materials, and care plan.   Telephone follow up appointment with care management team member scheduled for: 07/31/22  Reece Levy, MSW, LCSW Clinical Social Worker [facility] [employer] [phone]

## 2022-07-25 ENCOUNTER — Telehealth: Payer: Self-pay | Admitting: *Deleted

## 2022-07-25 LAB — BMP8+ANION GAP
Anion Gap: 18 mmol/L (ref 10.0–18.0)
BUN/Creatinine Ratio: 13 (ref 12–28)
BUN: 18 mg/dL (ref 8–27)
CO2: 20 mmol/L (ref 20–29)
Calcium: 9.4 mg/dL (ref 8.7–10.3)
Chloride: 100 mmol/L (ref 96–106)
Creatinine, Ser: 1.36 mg/dL — ABNORMAL HIGH (ref 0.57–1.00)
Glucose: 105 mg/dL — ABNORMAL HIGH (ref 70–99)
Potassium: 5.8 mmol/L — ABNORMAL HIGH (ref 3.5–5.2)
Sodium: 138 mmol/L (ref 134–144)
eGFR: 41 mL/min/{1.73_m2} — ABNORMAL LOW (ref >59)

## 2022-07-25 LAB — CBC
Hematocrit: 33.1 % — ABNORMAL LOW (ref 34.0–46.6)
Hemoglobin: 10 g/dL — ABNORMAL LOW (ref 11.1–15.9)
MCH: 23.4 pg — ABNORMAL LOW (ref 26.6–33.0)
MCHC: 30.2 g/dL — ABNORMAL LOW (ref 31.5–35.7)
MCV: 77 fL — ABNORMAL LOW (ref 79–97)
Platelets: 275 x10E3/uL (ref 150–450)
RBC: 4.28 x10E6/uL (ref 3.77–5.28)
RDW: 16.5 % — ABNORMAL HIGH (ref 11.7–15.4)
WBC: 10.8 x10E3/uL (ref 3.4–10.8)

## 2022-07-25 NOTE — Telephone Encounter (Signed)
Call from patient states [name] on left leg popped last night.  Noticed clear drainage on her sheets. Applied expired Neosporin and a dressing.  Plans to go and get a fresh tube of Neosporin today.  Raw under [name] now.  No fevers or any other discomfort at this time. Will inform Dr. [name] who saw [name] yesterday at her visit.

## 2022-07-28 ENCOUNTER — Telehealth: Payer: Self-pay | Admitting: Pharmacist

## 2022-07-28 NOTE — Telephone Encounter (Signed)
Patient provided PST FS POC INR result = 1.8 (target range 2.0 - 2.5) on 8.75 mg warfarin per wk. Increased back to 10 mg warfarin per wk as:Su-2.5mg M-2.5mg T-0mg W-2.5mg Th-1.25mg F-1.25mg Sa-1.25mg  using 2.5mg  strength green warfarin tabs. No bleeding, no missed doses other than those instructed last week when her INR was too high relative to the target range.

## 2022-07-28 NOTE — Telephone Encounter (Signed)
INTERNAL MEDICINE TEACHING ATTENDING ADDENDUM   I agree with these recommendations regarding anticoagulation management.   Shawnetta Lein, MD  

## 2022-07-29 ENCOUNTER — Other Ambulatory Visit: Payer: Self-pay

## 2022-07-29 NOTE — Patient Outreach (Signed)
[facility]ging [facility]fully Program  RN Visit  07/29/2022  [name] [birth year] [id number]  Visit:  RN Visit Number: 2- Second Visit  RN TIME CALCULATION: Start TIme:  RN Start Time Calculation: [phone] End Time:  RN Stop Time Calculation: 1045 Total Minutes:  RN Time Calculation: 61  Readiness To Change Score:     Universal RN Interventions: Calendar Distribution: Yes Exercise Review: Yes Medications: Yes Medication Changes: Yes Mood: Yes Pain: Yes PCP Advocacy/Support: Yes Fall Prevention: Yes Incontinence: Yes Clinician View Of Client Situation: Sat on porch to have home visit. meals on wheel were delivered. Looked at blister area to the left knee area.  looks like it is healing. no signs of infection. Client View Of His/Her Situation: Reports she is tired today.  Reports 1 recent fall after tripping over something.  Reports she saw her doctor on Thursday and met with her Child psychotherapist.   Reports pain 9/10 today. All over.  Healthcare Provider Communication:  Reviewed case with LCSW Reece Levy.     Clinician View of Client Situation: Clinician View Of Client Situation: Sat on porch to have home visit. meals on wheel were delivered. Looked at blister area to the left knee area.  looks like it is healing. no signs of infection. Client's View of His/Her Situation: Client View Of His/Her Situation: Reports she is tired today.  Reports 1 recent fall after tripping over something.  Reports she saw her doctor on Thursday and met with her Child psychotherapist.   Reports pain 9/10 today. All over.  Medication Assessment:denies any changes to medications    OT Update: pending community solutions modifications.  Session Summary: Doing about the same.  Admits to lack of motivation.    Goals Addressed             This Visit's Progress    Aging [facility]fully RN:       Goal: Patient will report decrease in constipation in the next 160 days.  07/01/2022 Assessment:   Reviewed long  history of constipation concerns with patient.  Patient reports that she does not take any thing for her bowels on a regular basis. Reports she may have a bowel movement 2 times in one day and then skip 5-7 days before having another BM.   Interventions:  reviewed causes for constipation.  Encouraged patient to keep log ( she has been doing this).  Encouraged patient to increase water, exercise and fiber. Encouraged patient to take her Senna 1 tablet twice a day and then we will see if she has improvement.  Reviewed importance of eating regular meals.  Plan: next home visit planned for 07/29/2022  Rowe Pavy RN, BSN, CEN RN Case Manager for [facility] Mobile: [phone]  CLIENT/RN ACTION PLAN - INCONTINENCE (Leak urine or bowel)  Registered Nurse:  Rowe Pavy RN Date:07/01/2022  Client Name:[name] Client ID:    Target Area:  CONSTIPATION   Why Problem May Occur: LACK OF EXERCISE, MEDICATION SIDE EFFECTS, LIMITED MOBILITY, LACK OF FIBER   Target Goal: Patient will report decrease in constipation in the 160 days.   STRATEGIES Coping Strategies Ideas:  Prevent Constipation Full belly presses the bladder and blocks urination. Drink more water. Eat more fruit, vegetables, high-fiber cereal.   Medications Take medications as prescribed  Plan Ahead Keep BM log  Diet Eat a fiber rich diet Stay hydrated   Mobility Stay active .  Other    Prevention Strategies Ideas  Recognize when you feel constipated.  If medications as not working. Try prunes and prune juice Call MD and do not go longer than 5 days between bowel movements.                      PRACTICE It is important to practice the strategies so we can determine if they will be effective in helping to reach the goal.    Follow these specific recommendations:        If strategy does not work the first time, try it again.  We may make some changes over the next few sessions.      Rowe Pavy RN, BSN, CEN RN Case Production designer, theatre/television/film for [employer] [facility] Mobile: [phone]  07/29/2022 Assessment:  Patient reports that she is having bowel movements every 7  days. States no changes.  Taking miralax as needed.  Reports she does not want to take every day. States she knows that she need to take it every day.  Has not increased any fiber. Patient recording BM's in her phone.  Interventions: Encouraged patient to take her medications for her bowel movements daily.  Reviewed use of fiber.  Reviewed importance of hydration. Provided exercise booklet and demonstrated. Encouraged daily completion.  Plan:  Next home visit planned for 08/26/2022  Rowe Pavy RN, BSN, CEN RN Case Manager for [employer] [facility] Mobile: [phone]        Rowe Pavy RN, BSN, Careers adviser for [employer] Mobile: [phone]

## 2022-07-29 NOTE — Patient Instructions (Signed)
Visit Information  Thank you for taking time to visit with me today. Please don't hesitate to contact me if I can be of assistance to you before our next scheduled home appointment.  Following are the goals we discussed today:   Goals Addressed             This Visit's Progress    Aging Gracefully RN:       Goal: Patient will report decrease in constipation in the next 160 days.  07/01/2022 Assessment:   Reviewed long history of constipation concerns with patient.  Patient reports that she does not take any thing for her bowels on a regular basis. Reports she may have a bowel movement 2 times in one day and then skip 5-7 days before having another BM.   Interventions:  reviewed causes for constipation.  Encouraged patient to keep log ( she has been doing this).  Encouraged patient to increase water, exercise and fiber. Encouraged patient to take her Senna 1 tablet twice a day and then we will see if she has improvement.  Reviewed importance of eating regular meals.  Plan: next home visit planned for 07/29/2022  Rowe Pavy RN, BSN, CEN RN Case Manager for [facility] Mobile: [phone]  CLIENT/RN ACTION PLAN - INCONTINENCE (Leak urine or bowel)  Registered Nurse:  Rowe Pavy RN Date:07/01/2022  Client Name:[name] Client ID:    Target Area:  CONSTIPATION   Why Problem May Occur: LACK OF EXERCISE, MEDICATION SIDE EFFECTS, LIMITED MOBILITY, LACK OF FIBER   Target Goal: Patient will report decrease in constipation in the 160 days.   STRATEGIES Coping Strategies Ideas:  Prevent Constipation Full belly presses the bladder and blocks urination. Drink more water. Eat more fruit, vegetables, high-fiber cereal.   Medications Take medications as prescribed  Plan Ahead Keep BM log  Diet Eat a fiber rich diet Stay hydrated   Mobility Stay active .  Other    Prevention Strategies Ideas  Recognize when you feel constipated.  If medications  as not working. Try prunes and prune juice Call MD and do not go longer than 5 days between bowel movements.                      PRACTICE It is important to practice the strategies so we can determine if they will be effective in helping to reach the goal.    Follow these specific recommendations:        If strategy does not work the first time, try it again.  We may make some changes over the next few sessions.     Rowe Pavy RN, BSN, CEN RN Case Production designer, theatre/television/film for [employer] [facility] Mobile: [phone]  07/29/2022 Assessment:  Patient reports that she is having bowel movements every 7  days. States no changes.  Taking miralax as needed.  Reports she does not want to take every day. States she knows that she need to take it every day.  Has not increased any fiber. Patient recording BM's in her phone.  Interventions: Encouraged patient to take her medications for her bowel movements daily.  Reviewed use of fiber.  Reviewed importance of hydration. Provided exercise booklet and demonstrated. Encouraged daily completion.  Plan:  Next home visit planned for 08/26/2022  Rowe Pavy RN, BSN, CEN RN Case Manager for [facility] Mobile: [phone]          Our next appointment is on 08/26/2022  at 1000am  If you are experiencing a Mental Health or Behavioral Health Crisis or need someone to talk to, please call the Suicide and Crisis Lifeline: 988 call the [place] National Suicide Prevention Lifeline: 323-232-2085 or TTY: 909-595-7937 TTY (763)239-1853) to talk to a trained counselor call 1-800-273-TALK (toll free, 24 hour hotline) go to [facility] [address], [place] [phone]) call 911   The patient verbalized understanding of instructions, educational materials, and care plan provided today and agreed to receive a mailed copy of patient instructions, educational materials, and  care plan.   Rowe Pavy RN, BSN, Careers adviser for [employer] Mobile: [phone]

## 2022-07-30 ENCOUNTER — Other Ambulatory Visit (HOSPITAL_COMMUNITY): Payer: Self-pay

## 2022-07-31 ENCOUNTER — Other Ambulatory Visit (HOSPITAL_COMMUNITY): Payer: Self-pay

## 2022-07-31 ENCOUNTER — Ambulatory Visit: Payer: Self-pay | Admitting: *Deleted

## 2022-07-31 ENCOUNTER — Other Ambulatory Visit: Payer: Self-pay | Admitting: Internal Medicine

## 2022-07-31 DIAGNOSIS — B372 Candidiasis of skin and nail: Secondary | ICD-10-CM

## 2022-07-31 NOTE — Patient Instructions (Signed)
Visit Information  Thank you for taking time to visit with me today. Please don't hesitate to contact me if I can be of assistance to you.   Following are the goals we discussed today:   Goals Addressed             This Visit's Progress    Provide support, resources and mental health support       Activities and task to complete in order to accomplish goals.    [employer] assisting with home modifications/repairs Follow up with DSS for Medicaid application (Go to DSS [employer] Assistance [address] [place]. [address]/Monday-Friday 8am-5pm) Call 988 anytime 24/7 for mental health crisis support Positive self talk Try listening to music/tv, coloring or other activities to help distract you from feelings of worry, sadness, etc. Consider counseling support Mail the letter from PCP along with your letter to [employer]PS to request door mail dropoff PCP to complete revised SCAT application for hopeful approval of door to door service and not curbside  Consider participating in activities outside the home- [facility],etc Consider riding with SCAT now that you have been approved! Follow up with [employer] PAL program for in-home help/possibly rides too        Our next appointment is by telephone on 08/08/22  Please call the care guide team at [phone] if you need to cancel or reschedule your appointment.   If you are experiencing a Mental Health or Behavioral Health Crisis or need someone to talk to, please call the Suicide and Crisis Lifeline: 988 call 911   The patient verbalized understanding of instructions, educational materials, and care plan provided today and DECLINED offer to receive copy of patient instructions, educational materials, and care plan.   Telephone follow up appointment with care management team member scheduled for: 08/08/22  Reece Levy, MSW, LCSW Clinical Social Worker [facility] [employer] [phone]

## 2022-07-31 NOTE — Patient Outreach (Signed)
  Care Coordination   Follow Up Visit Note   07/31/2022 Name: [name] MRN: [id number] DOB: 1950-07-18  [name] is a 72 y.o. year old female who sees [name], Kathryn Ar, MD for primary care. I spoke with  [name] by phone today.  What matters to the patients health and wellness today?  Still wondering if I'm  gonna get oxygen     Goals Addressed             This Visit's Progress    Provide support, resources and mental health support       Activities and task to complete in order to accomplish goals.    [employer] assisting with home modifications/repairs Follow up with DSS for Medicaid application (Go to DSS [employer] Assistance [address] [place]. [address]/Monday-Friday 8am-5pm) Call 988 anytime 24/7 for mental health crisis support Positive self talk Try listening to music/tv, coloring or other activities to help distract you from feelings of worry, sadness, etc. Consider counseling support Mail the letter from PCP along with your letter to [employer]PS to request door mail dropoff PCP to complete revised SCAT application for hopeful approval of door to door service and not curbside  Consider participating in activities outside the home- [facility],etc Consider riding with SCAT now that you have been approved! Follow up with [employer] PAL program for in-home help/possibly rides too        SDOH assessments and interventions completed:  Yes     Care Coordination Interventions:  Yes, provided  Interventions Today    Flowsheet Row Most Recent Value  Chronic Disease   Chronic disease during today's visit --  [Pt asking about getting oxygen and new Pulmonologist- will ask PCP to follow up with her on this]  General Interventions   General Interventions Discussed/Reviewed Community Resources  [CSW encouraged pt to go apply for Medicaid, to use SCAT and/or PAPA PALS for rides as needed.]  Durable Medical Equipment  (DME) Oxygen  [Pt asking about getting oxygen- PCP advised]  Nutrition Interventions   Nutrition Discussed/Reviewed Nutrition Discussed  [Pt reports she has resumed her Meals on [employer]Heels delivery]  Safety Interventions   Safety Discussed/Reviewed Safety Discussed       Follow up plan: Follow up call scheduled for 08/08/22    Encounter Outcome:  Pt. Visit Completed

## 2022-07-31 NOTE — Telephone Encounter (Signed)
Next appt scheduled 7/11 with PCP. 

## 2022-08-01 ENCOUNTER — Other Ambulatory Visit: Payer: Self-pay

## 2022-08-01 ENCOUNTER — Other Ambulatory Visit (HOSPITAL_COMMUNITY): Payer: Self-pay

## 2022-08-01 DIAGNOSIS — R52 Pain, unspecified: Secondary | ICD-10-CM | POA: Insufficient documentation

## 2022-08-01 MED ORDER — NYSTATIN 100000 UNIT/GM EX POWD
1.0000 | Freq: Three times a day (TID) | CUTANEOUS | 4 refills | Status: DC
Start: 2022-08-01 — End: 2022-09-29
  Filled 2022-08-01: qty 60, 30d supply, fill #0
  Filled 2022-08-07 – 2022-08-28 (×2): qty 60, 30d supply, fill #1

## 2022-08-01 NOTE — Assessment & Plan Note (Signed)
Due for A1c 08/2022. Weight on increased Ozempic (now 1 mg/wk) is relatively stable since 02/2022, with few pounds variation. Diet is poor. She will be resuming MOW but doesn't care for the meals.

## 2022-08-01 NOTE — Assessment & Plan Note (Signed)
R great toe medial nailbed improved after "working on it" - no change in distal-medial onycholysis.  Similar problem happening on L great toe nailbed.  NO evidence of ingrown nail, onychomycosis and onycholysis probably playing a role, encouraged to f/u with podiatrist.

## 2022-08-01 NOTE — Assessment & Plan Note (Signed)
Efforts to support her in the home ongoing with help of [facility] MSW.  Medicaid application hasn't been initiated (needed in order to proceed with ALF placement, her desire), though her assets may disqualify her.

## 2022-08-01 NOTE — Assessment & Plan Note (Signed)
Severe pain "all over". Multiple known sources of chronic pain.  Ketoralac injection today. Watching renal fxn.

## 2022-08-01 NOTE — Assessment & Plan Note (Signed)
Opioids most significant contributor.  She has Senokot but has been taking only one a day, and has miralax but hasn't taken it at all.  Discussed need to increase senna to 1 (or 2 if needed) twice a day and to begin Miralax daily.  She agreed.

## 2022-08-01 NOTE — Assessment & Plan Note (Signed)
Symptoms continue.  She is despondent over her situation in life, including her declining functional ability and independence and her suboptimal social support.  She is on max dose of duloxetine (dual indication chronic pain) and on buproprion 150 mg (she felt poorly on higher dose in past and does not desire dose increase).  Given her multimorbidity and significant polypharmacy (many of which are centrally acting), there are few pharmacologic options.  She would benefit most from behavioral health therapy.  She hasn't been keen on establishing with a new therapist after her former provider left our practice over a year ago. We'll continue to support her.  She is not suicidal.

## 2022-08-04 ENCOUNTER — Telehealth: Payer: Self-pay | Admitting: Pharmacist

## 2022-08-04 NOTE — Telephone Encounter (Signed)
Patient reports results of PST FS POC INR = 1.90 (target INR range 2.0 - 2.5) on 11.25 milligrams of warfarin per week. INCREASED to 12.5 milligrams of warfarin per week as:Su-2.5mg M-2.5mg T-1.25mg W-2.5mg Th-1.25mg F-1.25mg Sa-1.25mg 

## 2022-08-06 ENCOUNTER — Other Ambulatory Visit (HOSPITAL_COMMUNITY): Payer: Self-pay

## 2022-08-07 ENCOUNTER — Other Ambulatory Visit (HOSPITAL_COMMUNITY): Payer: Self-pay

## 2022-08-07 ENCOUNTER — Other Ambulatory Visit: Payer: Self-pay

## 2022-08-07 DIAGNOSIS — G8929 Other chronic pain: Secondary | ICD-10-CM

## 2022-08-07 DIAGNOSIS — F419 Anxiety disorder, unspecified: Secondary | ICD-10-CM

## 2022-08-08 ENCOUNTER — Ambulatory Visit: Payer: Self-pay | Admitting: *Deleted

## 2022-08-08 NOTE — Patient Instructions (Signed)
Visit Information  Thank you for taking time to visit with me today. Please don't hesitate to contact me if I can be of assistance to you.   Following are the goals we discussed today:   Goals Addressed             This Visit's Progress    Provide support, resources and mental health support       Activities and task to complete in order to accomplish goals.    Await word from [employer] assisting with home modifications/repairs Call [phone] anytime 24/7 for mental health crisis support Positive self talk Try listening to music/tv, coloring or other activities to help distract you from feelings of worry, sadness, etc. Consider counseling support Mail the letter from PCP along with your letter to [employer] to request door mail dropoff PCP to complete revised [employer] application for hopeful approval of door to door service and not curbside  Consider participating in activities outside the home- [facility],etc Consider riding with [employer] now that you have been approved! Follow up with [employer] program for in-home help/possibly rides too- maybe they can help with the cleaning of walls(smoke) Work on the decluttering goal we discussed- 1bag/box on street for bulk pickup trash day        Our next appointment is by telephone on 08/20/22  Please call the care guide team at [phone] if you need to cancel or reschedule your appointment.   If you are experiencing a Mental Health or Behavioral Health Crisis or need someone to talk to, please call the Suicide and Crisis Lifeline: 988 call 911   The patient verbalized understanding of instructions, educational materials, and care plan provided today and DECLINED offer to receive copy of patient instructions, educational materials, and care plan.   Telephone follow up appointment with care management team member scheduled for: 08/20/22 Reece Levy, MSW, LCSW Clinical Social Worker [facility] [employer] [phone]

## 2022-08-08 NOTE — Patient Outreach (Signed)
  Care Coordination   Follow Up Visit Note   08/08/2022 Name: [name] MRN: [id number] DOB: 1951-01-08  [name] is a 72 y.o. year old female who sees [name], Kathryn Ar, MD for primary care. I spoke with  [name] by phone today.  What matters to the patients health and wellness today?  Not eligible for (full) Medicaid, not gotten oxygen yet and still awaiting next steps for home repair assistance through [name]    Goals Addressed             This Visit's Progress    Provide support, resources and mental health support       Activities and task to complete in order to accomplish goals.    Await word from [name] assisting with home modifications/repairs Call [phone] anytime 24/7 for mental health crisis support Positive self talk Try listening to music/tv, coloring or other activities to help distract you from feelings of worry, sadness, etc. Consider counseling support Mail the letter from PCP along with your letter to [employer] to request door mail dropoff PCP to complete revised SC[employer] application for hopeful approval of door to door service and not curbside  Consider participating in activities outside the home- [facility],etc Consider riding with [employer] now that you have been approved! Follow up with [employer] program for in-home help/possibly rides too- maybe they can help with the cleaning of walls(smoke) Work on the decluttering goal we discussed- 1bag/box on street for bulk pickup trash day        SDOH assessments and interventions completed:  Yes     Care Coordination Interventions:  Yes, provided  Interventions Today    Flowsheet Row Most Recent Value  General Interventions   General Interventions Discussed/Reviewed [employer], Horticulturist, commercial (DME), Level of Care  [Pt is working on the steps needed to request [employer]PS deliver mail to her door. Has PCP letter to also provide. Pt is awaiting word from  [facility] on next steps on "the list of things they can help with at home". Also awaiting O2 ordered by PCP]  Durable Medical Equipment (DME) Oxygen  [Pt indicates she has not heard from DME provider for oxygen delivery- will alert PCP at her visit 08/21/22. CSW will also advise PCP.]  Level of Care Applications  Applications Medicaid  [Pt not eligible for full Medicaid (has partial Medicaid [employer] which pays her [name] premium). DSS advised pt when/if she has accumulated $5,000+ in medical bills to reapply, Pt reports currently owes @$3,000.]  Education Interventions   Applications Medicaid  [Pt not eligible for full Medicaid (has partial Medicaid [employer] which pays her [name] premium). DSS advised pt when/if she has accumulated $5,000+ in medical bills to reapply, Pt reports currently owes @$3,000.]  Mental Health Interventions   Mental Health Discussed/Reviewed Mental Health Discussed, Coping Strategies  [Pt states, " I about don't care anymore" (about her home-clutter,etc). CSW talked with pt about the negative effects the clutter has on her overall mental health. Pt encouraged to attempt to have 1 bag/box or 1 item out for bulk pickup each time.]  Nutrition Interventions   Nutrition Discussed/Reviewed Nutrition Discussed  [MOW's started back]  Safety Interventions   Safety Discussed/Reviewed Fall Risk  [Pt reports a fall yesterday at home- no injury]       Follow up plan: Follow up call scheduled for 08/20/22    Encounter Outcome:  Pt. Visit Completed

## 2022-08-10 MED ORDER — HYDROCODONE-ACETAMINOPHEN 5-325 MG PO TABS
1.0000 | ORAL_TABLET | Freq: Three times a day (TID) | ORAL | 0 refills | Status: DC
Start: 2022-08-10 — End: 2022-09-05
  Filled 2022-08-10: qty 180, 30d supply, fill #0

## 2022-08-10 MED ORDER — ALPRAZOLAM 1 MG PO TABS
0.5000 mg | ORAL_TABLET | Freq: Every evening | ORAL | 0 refills | Status: DC | PRN
Start: 2022-08-10 — End: 2022-09-05
  Filled 2022-08-10: qty 45, 30d supply, fill #0

## 2022-08-11 ENCOUNTER — Other Ambulatory Visit: Payer: Self-pay

## 2022-08-11 ENCOUNTER — Telehealth: Payer: Self-pay | Admitting: Pharmacist

## 2022-08-11 DIAGNOSIS — I480 Paroxysmal atrial fibrillation: Secondary | ICD-10-CM | POA: Diagnosis not present

## 2022-08-11 DIAGNOSIS — G4733 Obstructive sleep apnea (adult) (pediatric): Secondary | ICD-10-CM | POA: Diagnosis not present

## 2022-08-11 DIAGNOSIS — Z7901 Long term (current) use of anticoagulants: Secondary | ICD-10-CM | POA: Diagnosis not present

## 2022-08-11 NOTE — Telephone Encounter (Signed)
Patient provides PST FS POC INR result of 2.2 (target INR range 2.0 - 2.5) on 2.5mg  warfarin on Su/Mon/Wed and 1.25mg  (1/2 x 2.5mg  tablet) all other days. CONTINUE same regimen. Repeat INR on 11-JUL-24.

## 2022-08-12 ENCOUNTER — Other Ambulatory Visit: Payer: Self-pay

## 2022-08-12 ENCOUNTER — Other Ambulatory Visit (HOSPITAL_COMMUNITY): Payer: Self-pay

## 2022-08-13 ENCOUNTER — Ambulatory Visit: Payer: Self-pay | Admitting: *Deleted

## 2022-08-13 ENCOUNTER — Other Ambulatory Visit (HOSPITAL_COMMUNITY): Payer: Self-pay

## 2022-08-13 NOTE — Patient Instructions (Signed)
Visit Information  Thank you for taking time to visit with me today. Please don't hesitate to contact me if I can be of assistance to you.   Following are the goals we discussed today:   Goals Addressed             This Visit's Progress    Provide support, resources and mental health support       Activities and task to complete in order to accomplish goals.    Follow up visit this Saturday 08/16/22 from [facility] team member re: assisting with home modifications/repairs Call [phone] anytime 24/7 for mental health crisis support Continue with positive self talk Try listening to music/tv, coloring or other activities to help distract you from feelings of worry, sadness, etc. Consider counseling support Mail the letter from PCP along with your letter to [employer] to request door mail dropoff PCP to complete revised SC[facility] application for hopeful approval of door to door service and not curbside  Consider participating in activities outside the home- [facility],etc Consider riding with [employer] now that you have been approved! Follow up with [employer] program for in-home help/possibly rides too- maybe they can help with the cleaning of walls(smoke) Work on the decluttering goal we discussed- 1bag/box on street for bulk pickup trash day Continue to be productive as you shared this morning!         Our next appointment is by telephone on 08/20/22  Please call the care guide team at [phone] if you need to cancel or reschedule your appointment.   If you are experiencing a Mental Health or Behavioral Health Crisis or need someone to talk to, please call the Suicide and Crisis Lifeline: 988 call 911   The patient verbalized understanding of instructions, educational materials, and care plan provided today and DECLINED offer to receive copy of patient instructions, educational materials, and care plan.   Telephone follow up appointment with care management team  member scheduled for: 08/20/22   Reece Levy, MSW, LCSW Clinical Social Worker [facility] [employer] [phone]

## 2022-08-13 NOTE — Patient Outreach (Signed)
  Care Coordination   Follow Up Visit Note   08/13/2022 Name: [name] MRN: [id number] DOB: 04-Jun-1950  [name] is a 73 y.o. year old female who sees [name], Kathryn Ar, MD for primary care. I spoke with  [name] by phone today.  What matters to the patients health and wellness today?  Getting help to aide in overall health and mental health wellness.    Goals Addressed             This Visit's Progress    Provide support, resources and mental health support       Activities and task to complete in order to accomplish goals.    Follow up visit this Saturday 08/16/22 from [facility] team member re: assisting with home modifications/repairs Call [phone] anytime 24/7 for mental health crisis support Continue with positive self talk Try listening to music/tv, coloring or other activities to help distract you from feelings of worry, sadness, etc. Consider counseling support Mail the letter from PCP along with your letter to [employer] to request door mail dropoff PCP to complete revised SC[facility] application for hopeful approval of door to door service and not curbside  Consider participating in activities outside the home- [facility],etc Consider riding with [employer] now that you have been approved! Follow up with [employer] program for in-home help/possibly rides too- maybe they can help with the cleaning of walls(smoke) Work on the decluttering goal we discussed- 1bag/box on street for bulk pickup trash day Continue to be productive as you shared this morning!         SDOH assessments and interventions completed:  Yes     Care Coordination Interventions:  Yes, provided  Interventions Today    Flowsheet Row Most Recent Value  Chronic Disease   Chronic disease during today's visit Other  [depression]  General Interventions   General Interventions Discussed/Reviewed General Interventions Discussed, Community Resources   [Pt remains connected with [facility]]  Horticulturist, commercial (DME) Other  [Pt plans to call DME company to inquire about concerns she may have "mis-dialed" the device]  Mental Health Interventions   Mental Health Discussed/Reviewed Coping Strategies       Follow up plan: Follow up call scheduled for 08/20/22    Encounter Outcome:  Pt. Visit Completed

## 2022-08-16 ENCOUNTER — Other Ambulatory Visit: Payer: Self-pay | Admitting: Specialist

## 2022-08-18 ENCOUNTER — Other Ambulatory Visit (HOSPITAL_COMMUNITY): Payer: Self-pay

## 2022-08-18 ENCOUNTER — Telehealth: Payer: Self-pay | Admitting: *Deleted

## 2022-08-18 NOTE — Patient Outreach (Signed)
[facility]  OT Follow-Up Visit  08/18/2022  [name] 01-11-1951 [id number]  Visit:  2- Second Visit  Start Time:  1005 End Time:  1130 Total Minutes:  55  Patient Education: Education Provided: Yes Education Details: educated patient on how to safely get up from a fall Person(s) Educated: Patient, Child(ren), Other (comment) (sister) Comprehension: Verbalized Understanding  Goals:   Goals Addressed             This Visit's Progress    Patient Stated   On track    Patient will learn sleep hygiene strategies to improve quality of sleep.  Reviewed strategies for improving sleep quality in relation to environmental and  personal changes, as well as establishing a sleep hygiene routine.  Pt encouraged to select 2-3 strategies to implement, however, did not voice any that she was committed to at this visit.      Patient Stated       Patient will improve mobility including transferring in/out of chair and in/out of bed. Patient no longer wants to address this goal.     COMPLETED: Patient Stated       Patient will improve safety and independence when retrieving mail.  Patient no longer wants to address this goal.        Post Clinical Reasoning: Client Action (Goal) One Interventions: Patient will improve quality of sleep - discussed environmental and personal, and other factors that can affect quality of sleep, as well as importance of having a sleep routine.  Patient was somewhat receptive to information, although had many reasons they would not work.  Sleep hygiene handout left with client and encouraged her to read through and pick 1-2 strategies to implement. Did Client Try?: No Reason Client Did Not Try?: Other Targeted Problem Area Status: The Same Clinician View Of Client Situation:: [name] is desiring work and modifications that may be outside of the scope of AG.  OT spent a lengthy period of time explaining the program goals, OT goals,  and types of modifications typically covered with [name] and her daughter and her sister.  OT will call Crista Curb from [facility] to determine what was discussed at his home evaluation.  Of the 5 OT goals agreed upon at initial evaluation, [name] now only desires to work on 2 of the goals.   OT will modify goals. [name] is concerned about cosmetic details of repairs to her home versus actual structural repairs. Client View Of His/Her Situation:: Wants cosmetic repairs completed.  Is not receptive to use of walker in her home, despite mutliple falls.  Is not receptive to bidet attachment that OT recommended as she wants warm water, is not receptive to alternative recommendations. Next Visit Plan:: OT to contact Crista Curb with [facility] to determine next steps with home repairs and will then call [name].  Follow up on sleep hygiene, problem solve goal 2 toileting hygiene. [name], MHA, OT/L [phone]

## 2022-08-18 NOTE — Telephone Encounter (Signed)
Received fax from [facility]. INR result 2.0; test date 08/18/22@ 1134AM.

## 2022-08-20 ENCOUNTER — Encounter: Payer: Self-pay | Admitting: Pharmacist

## 2022-08-20 ENCOUNTER — Other Ambulatory Visit: Payer: Self-pay

## 2022-08-20 ENCOUNTER — Ambulatory Visit: Payer: Self-pay | Admitting: *Deleted

## 2022-08-20 ENCOUNTER — Other Ambulatory Visit: Payer: PPO | Admitting: Pharmacist

## 2022-08-20 NOTE — Patient Instructions (Signed)
Visit Information  Thank you for taking time to visit with me today. Please don't hesitate to contact me if I can be of assistance to you.   Following are the goals we discussed today:   Goals Addressed             This Visit's Progress    Provide support, resources and mental health support       Activities and task to complete in order to accomplish goals.    Inquire with PCP at visit tomorrow about your questions regarding Oxygen, DME, New Pulmonologist, etc Follow up with the [facility] team on plans to move forward with home repairs/adaptations to be made-  Call [phone] anytime 24/7 for mental health crisis support Continue with positive self talk Try listening to music/tv, coloring or other activities to help distract you from feelings of worry, sadness, etc. Call insurance Concierge and inquire about copay for counseling- I am happy to help connect you if agreeable- Follow up on the next steps for [employer] to request door mail dropoff PCP to complete revised SCAT application for hopeful approval of door to door service and not curbside  Consider participating in activities outside the home- [facility],etc Consider riding with [employer] now that you have been approved! Follow up with [employer] program for in-home help/possibly rides too- maybe they can help with the cleaning of walls(smoke) Work on the decluttering goal we discussed- 1bag/box on street for bulk pickup trash day Continue to be productive as you shared this morning!         Our next appointment is by telephone on 08/27/22    Please call the care guide team at [phone] if you need to cancel or reschedule your appointment.   If you are experiencing a Mental Health or Behavioral Health Crisis or need someone to talk to, please call the Suicide and Crisis Lifeline: 988 call 911   The patient verbalized understanding of instructions, educational materials, and care plan provided today and  DECLINED offer to receive copy of patient instructions, educational materials, and care plan.   Telephone follow up appointment with care management team member scheduled for:09/06/22  Reece Levy, MSW, LCSW Clinical Social Worker [facility] [employer] [phone]

## 2022-08-20 NOTE — Progress Notes (Signed)
08/20/2022 Name: [name] MRN: [id number] DOB: [birth year]  Chief Complaint  Patient presents with   Medication Management    [name] is a 72 y.o. year old female who presented for a telephone visit.   They were referred to the pharmacist by their PCP for assistance in managing complex medication management.    Subjective:  Care Team: Primary Care Provider: Miguel Aschoff, MD ; Next Scheduled Visit: 08/21/22  Medication Access/Adherence  Current Pharmacy:  [facility] [address] [place] [address] Phone: [phone] Fax: [phone]  [id number] [place], [place] - [address] AT [facility] [address] [facility] [address] [place] [address] Phone: [phone] Fax: [phone]   Patient reports affordability concerns with their medications: Yes  Patient reports access/transportation concerns to their pharmacy: No  Patient reports adherence concerns with their medications:  No    Medication Management:  Current adherence strategy: patient notes that she has been getting some 30 day supplies, some 90 day supplies for medications. She is confused about copays, in prior years there were more $0 copays for her medications.   Also reports that she does not like getting the 30 day supplies required for adherence packaging - given her insurance benefits/Extra Help, she is charged the same for a 30 day supply or a 90 day supply, so saves money with filling a 90 day supply.   Patient reports Good adherence to medications. Notes her system of organizing her pill bottles works well for her.    Objective:  Lab Results  Component Value Date   HGBA1C 7.4 (A) 07/24/2022    Lab Results  Component Value Date   CREATININE 1.36 (H) 07/24/2022   BUN 18 07/24/2022   NA 138 07/24/2022   K 5.8 (H) 07/24/2022   CL 100 07/24/2022   CO2 20 07/24/2022    Lab Results  Component  Value Date   CHOL 102 01/18/2022   HDL 32 (L) 01/18/2022   LDLCALC 40 01/18/2022   TRIG 148 01/18/2022   CHOLHDL 3.2 01/18/2022    Medications Reviewed Today     Reviewed by Alden Hipp, RPH-CPP (Pharmacist) on 08/20/22 at 1141  Med List Status: <None>   Medication Order Taking? Sig Documenting Provider Last Dose Status Informant  albuterol (PROAIR HFA) 108 (90 Base) MCG/ACT inhaler [id number] No Inhale 2 puffs into the lungs every 6 (six) hours as needed for shortness of breath. Miguel Aschoff, MD Taking Active   ALPRAZolam Prudy Feeler) 1 MG tablet [id number]  Take 1/2 - 1 tablet (1/2  - 1 mg total) by mouth at bedtime as needed for anxiety or sleep. May take 1/2 tablet during the day if needed for anxiety Miguel Aschoff, MD  Active   Blood Glucose Monitoring [facility] VERIO REFLECT) w/Device KIT [id number] No Use to check blood sugar 1 time a day Earl Lagos, MD Taking Active Self  buPROPion (WELLBUTRIN XL) 150 MG 24 hr tablet [id number] No Take 1 tablet (150 mg total) by mouth every morning. Miguel Aschoff, MD Taking Active   cetirizine [facility] ALLERGY) 10 MG tablet [id number] No Take 1 tablet (10 mg total) by mouth daily. Miguel Aschoff, MD Taking Active Self  Continuous Glucose Sensor (FREESTYLE LIBRE 3 SENSOR) [place] [id number] No Place 1 sensor on the skin every 14 days. Use to check glucose 6 times daily as directed Miguel Aschoff, MD Taking  Active   DULoxetine (CYMBALTA) 60 MG capsule [id number] No Take 1 capsule (60 mg total) by mouth 2 (two) times daily. Miguel Aschoff, MD Taking Active   Fluticasone-Umeclidin-Vilant [facility] ELLIPTA) 100-62.5-25 MCG/ACT AEPB [id number] No Inhale 1 puff into the lungs daily. Waymon Budge, MD Taking Active   furosemide (LASIX) 80 MG tablet [id number] No Take 1 tablet by mouth every morning AND 1/2 tablet daily in the afternoon.  Patient taking differently: Take 1 tablet  80 mg by mouth every morning AND 1/2  tablet 40 mg  daily in the afternoon.   Laurey Morale, MD Taking Active Self  HYDROcodone-acetaminophen (NORCO/VICODIN) 5-325 MG tablet [id number]  Take 1-2 tablets by mouth 3 (three) times daily. Miguel Aschoff, MD  Active   insulin degludec [facility]) 100 UNIT/ML FlexTouch Pen [id number] No Inject 44 Units into the skin daily. Miguel Aschoff, MD Taking Active   Insulin Pen Needle 32G X 4 MM MISC [id number] No Use to inject insulin up to 4 times a day Rana Snare, DO Taking Active   ipratropium (ATROVENT) 0.03 % nasal spray [id number] No Instill 2 sprays into the nose 3 (three) times daily as directed Miguel Aschoff, MD Taking Active   ipratropium-albuterol (DUONEB) 0.5-2.5 (3) MG/3ML SOLN [id number] No Inhale 3 mLs by nebulization every 6 (six) hours as needed (shortness of breath, wheezing). Miguel Aschoff, MD Taking Active   ketoconazole (NIZORAL) 2 % cream [id number] No Apply 1 application topically at bedtime as needed for irritation. Miguel Aschoff, MD Taking Active Self  Lancets Misc. (ACCU-CHEK FASTCLIX LANCET) KIT [id number] No Check your blood 4 times a day dx code 250.00 insulin requiring Doneen Poisson, MD Taking Active Self  lidocaine (LIDODERM) 5 % [id number] No Place 1 or 2 patches to painful area of back each day.  Remove & Discard patch within 12 hours or as directed by MD Miguel Aschoff, MD Taking Active   metoprolol succinate (TOPROL XL) 25 MG 24 hr tablet [id number] No Take 1 tablet (25 mg total) by mouth daily. Adron Bene, MD Taking Active   nystatin [facility]) powder [id number]  Apply topically 3  times daily. Miguel Aschoff, MD  Active   omeprazole (PRILOSEC) 40 MG capsule [id number] No Take 1 capsule (40 mg total) by mouth in the morning and at bedtime. Miguel Aschoff, MD Taking Active   ondansetron [facility]) 4 MG tablet [id number] No Take 1 tablet (4 mg total) by mouth every 8 (eight) hours as needed for nausea or vomiting.  Miguel Aschoff, MD Taking Active   OXYGEN [id number] No Inhale 2 L into the lungs at bedtime. [provider] Taking Active Self  potassium chloride SA (KLOR-CON M) 20 MEQ tablet [id number] No Take 2 tablets by mouth in the morning and 1 tablet in the afternoon. Miguel Aschoff, MD Taking Active   rosuvastatin (CRESTOR) 20 MG tablet [id number] No Take 1 tablet (20 mg total) by mouth at bedtime. Miguel Aschoff, MD Taking Active   Semaglutide, 1 MG/DOSE, 4 MG/3ML Namon Cirri [id number] No Inject 1 mg into the skin once a week. Miguel Aschoff, MD Taking Active   senna-docusate [facility]) 8.6-50 MG tablet [id number] No Take 1 to 2 tablets by mouth once or twice a day as needed for constipation Miguel Aschoff, MD Taking Active Self           Med Note Suezanne Cheshire   Fri Aug 23, 2021 10:52 AM)  warfarin (COUMADIN) 2.5 MG tablet [id number] No Take 1 tablet by mouth daily unless otherwise instructed. Miguel Aschoff, MD Taking Active            Med Note Clearance Coots, Alanda Slim Jun 12, 2022 11:23 AM) 1/2 tablet 6 days a week, 1 tablet 1 day a week.               Assessment/Plan:   Medication Management: - Currently strategy sufficient to maintain appropriate adherence to prescribed medication regimen - Suggested use of weekly pill box to organize medications. Patient declines, likes her current system.  - Reviewed most recent copays with the pharmacy. Everything appears appropriate given [employer] formulary and Medicare Extra Help.  Patient denies other medication related questions or concerns at this time. Pharmacy needs met.   Follow Up Plan: follow up with PCP tomorrow as scheduled. Patient has my contact information for future questions or concerns.   Catie Eppie Gibson, PharmD, BCACP, CPP Clinical Pharmacist [facility] [phone]

## 2022-08-20 NOTE — Patient Outreach (Signed)
  Care Coordination   Follow Up Visit Note   08/20/2022 Name: [name] MRN: [id number] DOB: 1950-06-04  [name] is a 72 y.o. year old female who sees [name], Kathryn Ar, MD for primary care. I spoke with  [name] by phone today.  What matters to the patients health and wellness today?  Things are progressing with [employer] to assist with new roof, bathroom adaptations, clean drains, widen door and no interest payments can be made.     Goals Addressed             This Visit's Progress    Provide support, resources and mental health support       Activities and task to complete in order to accomplish goals.    Inquire with PCP at visit tomorrow about your questions regarding Oxygen, DME, New Pulmonologist, etc Follow up with the [facility] team on plans to move forward with home repairs/adaptations to be made-  Call [phone] anytime 24/7 for mental health crisis support Continue with positive self talk Try listening to music/tv, coloring or other activities to help distract you from feelings of worry, sadness, etc. Call insurance Concierge and inquire about copay for counseling- I am happy to help connect you if agreeable- Follow up on the next steps for [employer] to request door mail dropoff PCP to complete revised [employer] application for hopeful approval of door to door service and not curbside  Consider participating in activities outside the home- [facility],etc Consider riding with [employer] now that you have been approved! Follow up with [employer] program for in-home help/possibly rides too- maybe they can help with the cleaning of walls(smoke) Work on the decluttering goal we discussed- 1bag/box on street for bulk pickup trash day Continue to be productive as you shared this morning!         SDOH assessments and interventions completed:  Yes     Care Coordination Interventions:  Yes, provided  Interventions Today     Flowsheet Row Most Recent Value  Chronic Disease   Chronic disease during today's visit Other, Diabetes  [depression]  General Interventions   General Interventions Discussed/Reviewed Durable Medical Equipment (DME), Community Resources, Doctor Visits  [Pt reports she has not heard from DME provider for Oxygen- plans to see PCP tomorrow and is encouraged to inquire about this as well as referral to Pulmonologist,etc]  Doctor Visits Discussed/Reviewed Doctor Visits Discussed  Durable Medical Equipment (DME) Oxygen  Mental Health Interventions   Mental Health Discussed/Reviewed Mental Health Discussed, Coping Strategies, Depression  [Pt willing to participate in Counseling (prefers virtual) if the copay is not too much. Pt to call [employer] and inquire.]       Follow up plan: Follow up call scheduled for 08/27/22    Encounter Outcome:  Pt. Visit Completed

## 2022-08-20 NOTE — Patient Instructions (Signed)
Rehanna,   Please feel free to reach out with any future medication-related issues!  Catie Eppie Gibson, PharmD, BCACP, CPP Clinical Pharmacist [facility] [phone]

## 2022-08-21 ENCOUNTER — Other Ambulatory Visit (HOSPITAL_COMMUNITY): Payer: Self-pay

## 2022-08-21 ENCOUNTER — Telehealth: Payer: Self-pay | Admitting: Internal Medicine

## 2022-08-21 ENCOUNTER — Ambulatory Visit (HOSPITAL_COMMUNITY)
Admission: RE | Admit: 2022-08-21 | Discharge: 2022-08-21 | Disposition: A | Discharge status: Home / Self Care | Payer: PPO | Source: Ambulatory Visit | Attending: Internal Medicine | Admitting: Internal Medicine

## 2022-08-21 ENCOUNTER — Other Ambulatory Visit: Payer: Self-pay

## 2022-08-21 ENCOUNTER — Ambulatory Visit (INDEPENDENT_AMBULATORY_CARE_PROVIDER_SITE_OTHER): Payer: PPO | Admitting: Internal Medicine

## 2022-08-21 VITALS — BP 110/44 | HR 60 | Temp 97.5°F | Ht 62.0 in | Wt 160.5 lb

## 2022-08-21 DIAGNOSIS — N1831 Chronic kidney disease, stage 3a: Secondary | ICD-10-CM

## 2022-08-21 DIAGNOSIS — K5909 Other constipation: Secondary | ICD-10-CM

## 2022-08-21 DIAGNOSIS — Z794 Long term (current) use of insulin: Secondary | ICD-10-CM | POA: Diagnosis not present

## 2022-08-21 DIAGNOSIS — J449 Chronic obstructive pulmonary disease, unspecified: Secondary | ICD-10-CM

## 2022-08-21 DIAGNOSIS — E1122 Type 2 diabetes mellitus with diabetic chronic kidney disease: Secondary | ICD-10-CM | POA: Diagnosis not present

## 2022-08-21 DIAGNOSIS — D509 Iron deficiency anemia, unspecified: Secondary | ICD-10-CM

## 2022-08-21 DIAGNOSIS — I700 Atherosclerosis of aorta: Secondary | ICD-10-CM | POA: Diagnosis not present

## 2022-08-21 DIAGNOSIS — R058 Other specified cough: Secondary | ICD-10-CM | POA: Diagnosis not present

## 2022-08-21 DIAGNOSIS — M79674 Pain in right toe(s): Secondary | ICD-10-CM

## 2022-08-21 DIAGNOSIS — M79675 Pain in left toe(s): Secondary | ICD-10-CM | POA: Diagnosis not present

## 2022-08-21 DIAGNOSIS — I517 Cardiomegaly: Secondary | ICD-10-CM | POA: Diagnosis not present

## 2022-08-21 DIAGNOSIS — E118 Type 2 diabetes mellitus with unspecified complications: Secondary | ICD-10-CM

## 2022-08-21 DIAGNOSIS — I10 Essential (primary) hypertension: Secondary | ICD-10-CM

## 2022-08-21 DIAGNOSIS — I129 Hypertensive chronic kidney disease with stage 1 through stage 4 chronic kidney disease, or unspecified chronic kidney disease: Secondary | ICD-10-CM

## 2022-08-21 DIAGNOSIS — F17210 Nicotine dependence, cigarettes, uncomplicated: Secondary | ICD-10-CM | POA: Diagnosis not present

## 2022-08-21 DIAGNOSIS — R0600 Dyspnea, unspecified: Secondary | ICD-10-CM | POA: Diagnosis not present

## 2022-08-21 DIAGNOSIS — D500 Iron deficiency anemia secondary to blood loss (chronic): Secondary | ICD-10-CM

## 2022-08-21 MED ORDER — LINACLOTIDE 72 MCG PO CAPS
72.0000 ug | ORAL_CAPSULE | Freq: Every day | ORAL | 1 refills | Status: DC
Start: 2022-08-21 — End: 2022-12-13
  Filled 2022-08-21: qty 30, 30d supply, fill #0
  Filled 2022-09-23: qty 30, 30d supply, fill #1

## 2022-08-21 NOTE — Assessment & Plan Note (Signed)
Pale today.  Recheck CBC and iron studies given how quickly she can become deficient.

## 2022-08-21 NOTE — Telephone Encounter (Signed)
Patient provides results from her Monday 8JUL24 PST FS INR = 2.00 while I was on vacation. She is advised to CONTINUE the same dosing regimen of: 2.5mg  warfarin Su/Mo/We; 1.25mg  (1/2 x 2.5mg  tab) on Tu/Th/Fri/Sat. Repeat INR 15JUL24.

## 2022-08-21 NOTE — Assessment & Plan Note (Signed)
Feet with very purple toes (chronic).  Cool to touch as usual.  No palpable pedal pulses today.  Soft very tender spot R great toe adjacent to medial nailfold without visible overlying skin change.  Manipulation of nail is painful.  No obvious inflammation of nail fold (no swelling or color difference, no drainage).  No pain or abnormality noted of the right fifth toe, or right first or fifth toes today.  She notes that the pain can be episodic.  Toe pain is concerning.  Not consistent with neuropathy.  No apparent paronychia, but very careful palpation is suggestive of some fluctuance adjacent to the medial great toenail fold.  The other possibility is ischemic pain without ulceration.  She had called to make an appointment with the podiatrist but could not get in until October.  I called and left a message requesting that she be worked in sooner for this problem.  Loraine Leriche so concerned about PAD, and she has vascular studies and clinical follow-up soon.  I will reach out to them with the concerns about the toe pain.

## 2022-08-21 NOTE — Assessment & Plan Note (Signed)
Failed to order A1c today as was due.  On insulin degludec 44 units daily, and semaglutide 1 mg weekly with no  associated weight loss.The patient is currently using Continuous Glucose Monitoring. The patient is injecting insulin once a day. The patient is making adjustments to their diabetes regimen based on glucose readings.  Average glucose 160, time within target 66%, no lows, 34% high/very high.  Fasting sugars look excellent and rise around midday. No changes in insulin.  Planned to  increase in semaglutide to 1.5, though hesitant given her GI symptoms. Continue 1 mg weekly for now.

## 2022-08-21 NOTE — Assessment & Plan Note (Signed)
Not achieving optimal results with senna and MiraLAX.  Will begin Linzess low-dose daily-as needed augmentation with senna and MiraLAX.

## 2022-08-21 NOTE — Progress Notes (Signed)
Routine follow-up for Ms. Nudd for close monitoring of her complex multi morbidity.  2 greatest concerns today are toe pain and breathing.  L great toe medial toe nail fold excruciating, along with pain in R 5th toe.  There is a able to wear a sock and shoe; significant pain with walking.  Similar on L though not as intense (both 1st and 5th toes).  Worsening shortness of breath over the past couple of months.  She is using her rescue inhaler more frequently.  Notes that she has had a productive cough without fever over the same timeframe.  "Sometimes I cough up some much I am was good strangled".  She did not have productive cough this morning.  Constipation treating with Miralax and Senokot, fair results.  Hiccups for weeks.  Not losing weight.  On/off nausea (RUQ abdominal pain noted earlier in the year has largely resolved), though she does have discomfort which alternates between both upper quadrants which could be related to constipation.  She feels some improvement with general GI concerns after starting IB Guard.      Burning pain has returned in feet, restarted gabapentin which helps but makes her sleepy (taking in the morning to relieve pain upon awakening).  Eye appt scheduled 01/17/23 Podiatrist 10/15/2o24 Mammogram 09/04/22 Vascular study then provider -   BP (!) 110/44 (BP Location: Left Arm, Patient Position: Sitting, Cuff Size: Small)   Pulse 60   Temp (!) 97.5 F (36.4 C) (Oral)   Ht 5\' 2"  (1.575 m)   Wt 160 lb 8 oz (72.8 kg)   SpO2 97%   BMI 29.36 kg/m  Breathing comfortably with no apparent dyspnea.  No cough during today's visit.  Lungs with fine scattered rales, no rhonchi today.  Diminished breath sounds in bases but no dullness to percussion.  Overall good airflow.  Heart irreg irreg, distant heart sounds.  Skin turgor reduced throughout.  Feet with very purple toes (chronic).  Cool to touch as usual.  No palpable pedal pulses today.  Soft very tender spot next to  nail without visible overlying skin change.  Manipulation of nail is painful.  No obvious inflammation of nail fold (no swelling or color difference, no drainage).  Problems, assessments, and plans:  Chronic constipation Not achieving optimal results with senna and MiraLAX.  Will begin Linzess low-dose daily-as needed augmentation with senna and MiraLAX.  Essential hypertension 110/44 on metoprolol succinate 25 mg daily and furosemide 80 mg in the morning and 40 mg in the evening.  After checking renal function today, and based on decreased skin turgor overall, plan to decrease diuretic and potassium.  Type 2 diabetes mellitus with complications (HCC) Failed to order A1c today as was due.  On insulin degludec 44 units daily, and semaglutide 1 mg weekly with no  associated weight loss.The patient is currently using Continuous Glucose Monitoring. The patient is injecting insulin once a day. The patient is making adjustments to their diabetes regimen based on glucose readings.  Average glucose 160, time within target 66%, no lows, 34% high/very high.  Fasting sugars look excellent and rise around midday. No changes in insulin.  Planned to  increase in semaglutide to 1.5, though hesitant given her GI symptoms. Continue 1 mg weekly for now.    Pain of great toes medial nail folds of both feet Feet with very purple toes (chronic).  Cool to touch as usual.  No palpable pedal pulses today.  Soft very tender spot R great toe adjacent  to medial nailfold without visible overlying skin change or warmth.  Manipulation of nail is painful.  No obvious inflammation of nail fold (no swelling or color difference, no drainage).  No pain or abnormality noted of the right fifth toe, or right first or fifth toes today.  She notes that the pain can be episodic.  Toe pain is concerning.  Not consistent with neuropathy.  No apparent paronychia, but very careful palpation is suggestive of some fluctuance adjacent to the  medial great toenail fold.  The other possibility is ischemic pain without ulceration.  She had called to make an appointment with the podiatrist but could not get in until October.  I called and left a message requesting that she be worked in sooner for this problem.  More so concerned about PAD, and she has vascular studies and clinical follow-up soon.  I will reach out to them with the concerns about the toe pain.  Chronic iron deficiency anemia Pale today.  Recheck CBC and iron studies given how quickly she can become deficient.  COPD mixed type (HCC) 97% on RA.  Continues to experience worsening breathing overall, and more frequent productive cough.  Lung exam with very fine scattered rales in all lung fields, but no dependent rales and no wheeze or rhonchi.  Her lungs actually sound better today than they have at prior visits.  2 view CXR today.  No abnormalities noted at her recent lung cancer screening CT.  Given her desire for a different pulmonologist, I called the office today and let the staff member know her wishes-the message will be passed along to Dr. Maple Hudson as a courtesy and the office will be calling Ms. Lindenberger to schedule.  Given lung exam now, I would not treat this is an acute COPD exacerbation.  She continues her Trelegy Ellipta and rescue albuterol.  She has not yet heard from any oxygen suppliers.  I will try to follow-up on where this stands.   I'll call her with xray and lab results and any next steps.

## 2022-08-21 NOTE — Telephone Encounter (Signed)
Pt PCP called in bc Pt wants to switch providers

## 2022-08-21 NOTE — Assessment & Plan Note (Signed)
Continues to experience worsening breathing overall, and more frequent productive cough.  Lung exam with very fine scattered rales in all lung fields, but no dependent rales and no wheeze or rhonchi.  Her lungs actually sound better today than they have at prior visits.  2 view CXR today.  No abnormalities noted at her recent lung cancer screening CT.  Given her desire for a different pulmonologist, I called the office today and let the staff member know her wishes-the message will be passed along to Dr. Maple Hudson as a courtesy and the office will be calling Ms. Heid to schedule.  Given lung exam now, I would not treat this is an acute COPD exacerbation.  She continues her Trelegy Ellipta and rescue albuterol.  She has not yet heard from any oxygen suppliers.  I will try to follow-up on where this stands.

## 2022-08-21 NOTE — Assessment & Plan Note (Signed)
110/44 on metoprolol succinate 25 mg daily and furosemide 80 mg in the morning and 40 mg in the evening.  After checking renal function today, and based on decreased skin turgor overall, plan to decrease diuretic and potassium.

## 2022-08-21 NOTE — Patient Instructions (Addendum)
I called [facility].  They are courtesy notifying Dr. Maple Hudson.  Once done, you will be called with an appt with a different pulmonologist. I called Podiatry.  Office closed, left message for you to be contacted for an earlier appointment. For constipation - I'd like you to try a new everyday pill called Linzess.  You can continue to use your other medicines as needed. We are checking an xray today.  Your CAT scan in May didn't show changes, but you've had worse symptoms since then. Checking blood tests today.  I'll call with results.  I'm thinking that we may go down on your lasix and your potassium. Stay tuned. I'm worried about your toe.  I'm going to message your vascular folks to see if you can get in earlier.  OK that you're restarting gabapentin.  Try just one pill at night to see if that gets you through the following morning. I'm checking on the oxygen dilemma.    Let's get together at my next available which will hopefull be within this month.  Keep going!!!!  Dr. Lacretia Nicks.

## 2022-08-22 LAB — CBC
Hematocrit: 28.8 % — ABNORMAL LOW (ref 34.0–46.6)
Hemoglobin: 8.3 g/dL — ABNORMAL LOW (ref 11.1–15.9)
MCH: 21.6 pg — ABNORMAL LOW (ref 26.6–33.0)
MCHC: 28.8 g/dL — ABNORMAL LOW (ref 31.5–35.7)
MCV: 75 fL — ABNORMAL LOW (ref 79–97)
Platelets: 240 10*3/uL (ref 150–450)
RBC: 3.84 x10E6/uL (ref 3.77–5.28)
RDW: 16.7 % — ABNORMAL HIGH (ref 11.7–15.4)
WBC: 7.9 10*3/uL (ref 3.4–10.8)

## 2022-08-22 LAB — IRON,TIBC AND FERRITIN PANEL
Ferritin: 6 ng/mL — ABNORMAL LOW (ref 15–150)
Iron Saturation: 6 % — CL (ref 15–55)
Iron: 28 ug/dL (ref 27–139)
Total Iron Binding Capacity: 441 ug/dL (ref 250–450)
UIBC: 413 ug/dL — ABNORMAL HIGH (ref 118–369)

## 2022-08-22 LAB — BMP8+ANION GAP
Anion Gap: 12 mmol/L (ref 10.0–18.0)
BUN/Creatinine Ratio: 15 (ref 12–28)
BUN: 17 mg/dL (ref 8–27)
CO2: 23 mmol/L (ref 20–29)
Calcium: 9.2 mg/dL (ref 8.7–10.3)
Chloride: 100 mmol/L (ref 96–106)
Creatinine, Ser: 1.12 mg/dL — ABNORMAL HIGH (ref 0.57–1.00)
Glucose: 100 mg/dL — ABNORMAL HIGH (ref 70–99)
Potassium: 5.9 mmol/L — ABNORMAL HIGH (ref 3.5–5.2)
Sodium: 135 mmol/L (ref 134–144)
eGFR: 52 mL/min/{1.73_m2} — ABNORMAL LOW (ref >59)

## 2022-08-25 ENCOUNTER — Telehealth: Payer: Self-pay

## 2022-08-25 NOTE — Telephone Encounter (Signed)
Fine to switch

## 2022-08-25 NOTE — Telephone Encounter (Signed)
Spoke with patient. Patient is requesting to switch providers. Dr. Maple Hudson please advise if this is okay?

## 2022-08-25 NOTE — Telephone Encounter (Signed)
ATC X1 LVM  Please schedule patient with a new provider, whoever has 1st available

## 2022-08-25 NOTE — Telephone Encounter (Signed)
Pt is requesting a call back about her Ozempic she stated that at her last visit her PCP was speaking about changing her dose but she has not heard anything and when she went to collect the  med is was still the same dose

## 2022-08-25 NOTE — Telephone Encounter (Signed)
Patient provides INR results (PST, FS, POC device at home) as 2.1 (Target range 2.0 - 2.5) on 2.5mg  M/W/Su; 1.25mg  (1/2 x 2.5mg ) on all other days. CONTINUE SAME REGIMEN. Repeat PST FS POC INR 22JUL24. No bleeding endorsed by the patient.

## 2022-08-25 NOTE — Telephone Encounter (Signed)
Return pt's call who stated she thought Ozempic dose was going to be increase; currently on 1 mg. Per Dr [name]' note at [facility]; pt was informed  "Planned to increase in semaglutide to 1.5, though hesitant given her GI symptoms. Continue 1 mg weekly for now."  Pt stated ok. Also pt stated she has an appt to see Pulmonary at [facility] with Dr Vassie Loll on 10/21/22.

## 2022-08-26 ENCOUNTER — Telehealth: Payer: Self-pay | Admitting: Internal Medicine

## 2022-08-26 ENCOUNTER — Other Ambulatory Visit: Payer: Self-pay

## 2022-08-26 DIAGNOSIS — I5032 Chronic diastolic (congestive) heart failure: Secondary | ICD-10-CM

## 2022-08-26 DIAGNOSIS — E876 Hypokalemia: Secondary | ICD-10-CM

## 2022-08-26 MED ORDER — FUROSEMIDE 80 MG PO TABS
80.0000 mg | ORAL_TABLET | Freq: Every morning | ORAL | 3 refills | Status: DC
Start: 2022-08-26 — End: 2023-03-12
  Filled 2022-08-26 – 2022-10-16 (×6): qty 90, 90d supply, fill #0
  Filled 2023-01-09: qty 90, 90d supply, fill #1

## 2022-08-26 MED ORDER — POTASSIUM CHLORIDE CRYS ER 20 MEQ PO TBCR
20.0000 meq | EXTENDED_RELEASE_TABLET | Freq: Every day | ORAL | 3 refills | Status: DC
Start: 2022-08-26 — End: 2023-03-12
  Filled 2022-08-26 – 2023-01-09 (×5): qty 90, 90d supply, fill #0

## 2022-08-26 NOTE — Patient Instructions (Signed)
Visit Information  Thank you for taking time to visit with me today. Please don't hesitate to contact me if I can be of assistance to you before our next scheduled home appointment.  Following are the goals we discussed today:   Goals Addressed             This Visit's Progress    Aging Gracefully RN:       Goal: Patient will report decrease in constipation in the next 160 days.  07/01/2022 Assessment:   Reviewed long history of constipation concerns with patient.  Patient reports that she does not take any thing for her bowels on a regular basis. Reports she may have a bowel movement 2 times in one day and then skip 5-7 days before having another BM.   Interventions:  reviewed causes for constipation.  Encouraged patient to keep log ( she has been doing this).  Encouraged patient to increase water, exercise and fiber. Encouraged patient to take her Senna 1 tablet twice a day and then we will see if she has improvement.  Reviewed importance of eating regular meals.  Plan: next home visit planned for 07/29/2022  Rowe Pavy RN, BSN, CEN RN Case Manager for Aging Gracefully [facility] Mobile: [phone]  CLIENT/RN ACTION PLAN - INCONTINENCE (Leak urine or bowel)  Registered Nurse:  Rowe Pavy RN Date:07/01/2022  Client Name:[name] Client ID:    Target Area:  CONSTIPATION   Why Problem May Occur: LACK OF EXERCISE, MEDICATION SIDE EFFECTS, LIMITED MOBILITY, LACK OF FIBER   Target Goal: Patient will report decrease in constipation in the 160 days.   STRATEGIES Coping Strategies Ideas:  Prevent Constipation Full belly presses the bladder and blocks urination. Drink more water. Eat more fruit, vegetables, high-fiber cereal.   Medications Take medications as prescribed  Plan Ahead Keep BM log  Diet Eat a fiber rich diet Stay hydrated   Mobility Stay active .  Other    Prevention Strategies Ideas  Recognize when you feel constipated.  If medications  as not working. Try prunes and prune juice Call MD and do not go longer than 5 days between bowel movements.                       PRACTICE It is important to practice the strategies so we can determine if they will be effective in helping to reach the goal.    Follow these specific recommendations:        If strategy does not work the first time, try it again.  We may make some changes over the next few sessions.      Rowe Pavy RN, BSN, CEN RN Case Production designer, theatre/television/film for [employer] [facility] Mobile: [phone]  07/29/2022 Assessment:  Patient reports that she is having bowel movements every 7  days. States no changes.  Taking miralax as needed.  Reports she does not want to take every day. States she knows that she need to take it every day.  Has not increased any fiber. Patient recording BM's in her phone.  Interventions: Encouraged patient to take her medications for her bowel movements daily.  Reviewed use of fiber.  Reviewed importance of hydration. Provided exercise booklet and demonstrated. Encouraged daily completion.  Plan:  Next home visit planned for 08/26/2022  Rowe Pavy RN, BSN, CEN RN Case Manager for Aging Gracefully [facility] Mobile: [phone]  08/26/2022 Assessment:  Reports continued constipation and MD started patient on Linzess.  Reports bowel movements every other day. Reports she is doing her home exercises twice a week.   Interventions: reviewed medications and fall prevention.  Reviewed BM log. Encouraged daily exercise.    Plan: Next home visit planned for 09/30/2022  Rowe Pavy RN, BSN, CEN RN Case Manager for Aging Gracefully [facility] Mobile: [phone]           Our next appointment is on 09/30/2022 at 10am  If you are experiencing a Mental Health or Behavioral Health Crisis or need someone to talk to, please call the Suicide and Crisis Lifeline: 988 call the [place] National Suicide  Prevention Lifeline: (305)111-0474 or TTY: (806) 025-9956 TTY 352-226-1608) to talk to a trained counselor call 1-800-273-TALK (toll free, 24 hour hotline) go to [facility] [address], [place] [phone]) call 911   The patient verbalized understanding of instructions, educational materials, and care plan provided today and agreed to receive a mailed copy of patient instructions, educational materials, and care plan.   Rowe Pavy RN, BSN, Careers adviser for [employer] Mobile: [phone]

## 2022-08-26 NOTE — Telephone Encounter (Signed)
Patient missed your call she is requesting a call back.

## 2022-08-26 NOTE — Telephone Encounter (Signed)
Closing encounter since patient has been scheduled with Dr. Vassie Loll

## 2022-08-26 NOTE — Patient Outreach (Signed)
Aging Gracefully Program  RN Visit  08/26/2022  [name] 1950-06-27 [id number]  Visit:  RN Visit Number: 3- Third Visit  RN TIME CALCULATION: Start TIme:  RN Start Time Calculation: 1000 End Time:  RN Stop Time Calculation: 1105 Total Minutes:  RN Time Calculation: 65  Readiness To Change Score:     Universal RN Interventions: Calendar Distribution: Yes Exercise Review: Yes Medications: Yes Medication Changes: Yes Mood: Yes Pain: Yes PCP Advocacy/Support: Yes Fall Prevention: Yes Incontinence: Yes Clinician View Of Client Situation: Arrived to home visit and patient was smoking in her kitchen.  We sat outside for visit. ambualted to the porch. Client View Of His/Her Situation: Patient reports that she previously stopped taking her gabapentin and then foot pain was terrible. Reports that she just started her gbapentin back and started linzess.   Reports pain 9/10.   reports headaches today. Larey Seat on July 4th after loosing balance and fell backwards. Reports she hit her head on a wooden shelf in her closet.  denies injury.  Healthcare Provider Communication: none    Clinician View of Client Situation: Clinician View Of Client Situation: Arrived to home visit and patient was smoking in her kitchen.  We sat outside for visit. ambualted to the porch. Client's View of His/Her Situation: Client View Of His/Her Situation: Patient reports that she previously stopped taking her gabapentin and then foot pain was terrible. Reports that she just started her gbapentin back and started linzess.   Reports pain 9/10.   reports headaches today. Larey Seat on July 4th after loosing balance and fell backwards. Reports she hit her head on a wooden shelf in her closet.  denies injury.  Medication Assessment:  Outpatient Encounter Medications as of 08/26/2022  Medication Sig Note   albuterol (PROAIR HFA) 108 (90 Base) MCG/ACT inhaler Inhale 2 puffs into the lungs every 6 (six) hours as needed  for shortness of breath.    ALPRAZolam (XANAX) 1 MG tablet Take 1/2 - 1 tablet (1/2  - 1 mg total) by mouth at bedtime as needed for anxiety or sleep. May take 1/2 tablet during the day if needed for anxiety    Blood Glucose Monitoring Suppl (ONETOUCH VERIO REFLECT) w/Device KIT Use to check blood sugar 1 time a day    buPROPion (WELLBUTRIN XL) 150 MG 24 hr tablet Take 1 tablet (150 mg total) by mouth every morning.    cetirizine (ZYRTEC ALLERGY) 10 MG tablet Take 1 tablet (10 mg total) by mouth daily.    Continuous Glucose Sensor (FREESTYLE LIBRE 3 SENSOR) MISC Place 1 sensor on the skin every 14 days. Use to check glucose 6 times daily as directed    DULoxetine (CYMBALTA) 60 MG capsule Take 1 capsule (60 mg total) by mouth 2 (two) times daily.    Fluticasone-Umeclidin-Vilant (TRELEGY ELLIPTA) 100-62.5-25 MCG/ACT AEPB Inhale 1 puff into the lungs daily.    furosemide (LASIX) 80 MG tablet Take 1 tablet by mouth every morning AND 1/2 tablet daily in the afternoon. (Patient taking differently: Take 1 tablet  80 mg by mouth every morning AND 1/2 tablet 40 mg  daily in the afternoon.)    gabapentin (NEURONTIN) 600 MG tablet Take 600 mg by mouth at bedtime.    HYDROcodone-acetaminophen (NORCO/VICODIN) 5-325 MG tablet Take 1-2 tablets by mouth 3 (three) times daily.    insulin degludec (TRESIBA FLEXTOUCH) 100 UNIT/ML FlexTouch Pen Inject 44 Units into the skin daily.    Insulin Pen Needle 32G X 4 MM MISC Use  to inject insulin up to 4 times a day    ipratropium (ATROVENT) 0.03 % nasal spray Instill 2 sprays into the nose 3 (three) times daily as directed    ipratropium-albuterol (DUONEB) 0.5-2.5 (3) MG/3ML SOLN Inhale 3 mLs by nebulization every 6 (six) hours as needed (shortness of breath, wheezing).    ketoconazole (NIZORAL) 2 % cream Apply 1 application topically at bedtime as needed for irritation.    Lancets Misc. (ACCU-CHEK FASTCLIX LANCET) KIT Check your blood 4 times a day dx code 250.00 insulin  requiring    lidocaine (LIDODERM) 5 % Place 1 or 2 patches to painful area of back each day.  Remove & Discard patch within 12 hours or as directed by MD    linaclotide (LINZESS) 72 MCG capsule Take 1 capsule (72 mcg total) by mouth daily before breakfast.    metoprolol succinate (TOPROL XL) 25 MG 24 hr tablet Take 1 tablet (25 mg total) by mouth daily.    nystatin [facility]) powder Apply topically 3  times daily.    omeprazole (PRILOSEC) 40 MG capsule Take 1 capsule (40 mg total) by mouth in the morning and at bedtime.    ondansetron (ZOFRAN) 4 MG tablet Take 1 tablet (4 mg total) by mouth every 8 (eight) hours as needed for nausea or vomiting.    OXYGEN Inhale 2 L into the lungs at bedtime.    potassium chloride SA (KLOR-CON M) 20 MEQ tablet Take 2 tablets by mouth in the morning and 1 tablet in the afternoon.    rosuvastatin (CRESTOR) 20 MG tablet Take 1 tablet (20 mg total) by mouth at bedtime.    Semaglutide, 1 MG/DOSE, 4 MG/3ML SOPN Inject 1 mg into the skin once a week.    senna-docusate (SENOKOT S) 8.6-50 MG tablet Take 1 to 2 tablets by mouth once or twice a day as needed for constipation    warfarin (COUMADIN) 2.5 MG tablet Take 1 tablet by mouth daily unless otherwise instructed. 06/12/2022: 1/2 tablet 6 days a week, 1 tablet 1 day a week.    No facility-administered encounter medications on file as of 08/26/2022.       OT Update: pending contract signatures  Session Summary: Patient is doing about the same.  Reports that she is considering counseling.  Reviewed follow up appointment with social worker tomorrow.    Goals Addressed             This Visit's Progress    Aging Gracefully RN:       Goal: Patient will report decrease in constipation in the next 160 days.  07/01/2022 Assessment:   Reviewed long history of constipation concerns with patient.  Patient reports that she does not take any thing for her bowels on a regular basis. Reports she may have a bowel movement 2  times in one day and then skip 5-7 days before having another BM.   Interventions:  reviewed causes for constipation.  Encouraged patient to keep log ( she has been doing this).  Encouraged patient to increase water, exercise and fiber. Encouraged patient to take her Senna 1 tablet twice a day and then we will see if she has improvement.  Reviewed importance of eating regular meals.  Plan: next home visit planned for 07/29/2022  Rowe Pavy RN, BSN, CEN RN Case Manager for Aging Gracefully [facility] Mobile: [phone]  CLIENT/RN ACTION PLAN - INCONTINENCE (Leak urine or bowel)  Registered Nurse:  Rowe Pavy RN Date:07/01/2022  Client Name:Kathryn  Horn Client ID:    Target Area:  CONSTIPATION   Why Problem May Occur: LACK OF EXERCISE, MEDICATION SIDE EFFECTS, LIMITED MOBILITY, LACK OF FIBER   Target Goal: Patient will report decrease in constipation in the 160 days.   STRATEGIES Coping Strategies Ideas:  Prevent Constipation Full belly presses the bladder and blocks urination. Drink more water. Eat more fruit, vegetables, high-fiber cereal.   Medications Take medications as prescribed  Plan Ahead Keep BM log  Diet Eat a fiber rich diet Stay hydrated   Mobility Stay active .  Other    Prevention Strategies Ideas  Recognize when you feel constipated.  If medications as not working. Try prunes and prune juice Call MD and do not go longer than 5 days between bowel movements.                       PRACTICE It is important to practice the strategies so we can determine if they will be effective in helping to reach the goal.    Follow these specific recommendations:        If strategy does not work the first time, try it again.  We may make some changes over the next few sessions.      Rowe Pavy RN, BSN, CEN RN Case Production designer, theatre/television/film for [employer] [facility] Mobile: [phone]  07/29/2022 Assessment:  Patient reports that she  is having bowel movements every 7  days. States no changes.  Taking miralax as needed.  Reports she does not want to take every day. States she knows that she need to take it every day.  Has not increased any fiber. Patient recording BM's in her phone.  Interventions: Encouraged patient to take her medications for her bowel movements daily.  Reviewed use of fiber.  Reviewed importance of hydration. Provided exercise booklet and demonstrated. Encouraged daily completion.  Plan:  Next home visit planned for 08/26/2022  Rowe Pavy RN, BSN, CEN RN Case Manager for Aging Gracefully [facility] Mobile: [phone]  08/26/2022 Assessment:  Reports continued constipation and MD started patient on Linzess. Reports bowel movements every other day. Reports she is doing her home exercises twice a week.   Interventions: reviewed medications and fall prevention.  Reviewed BM log. Encouraged daily exercise.    Plan: Next home visit planned for 09/30/2022  Rowe Pavy RN, BSN, CEN RN Case Manager for Aging [facility]fully [facility] [employer]: [phone]        .acs

## 2022-08-26 NOTE — Telephone Encounter (Signed)
Left mssg with instructions:  Decrease lasix to one 80 mg pill every morning (omit afternoon dose) And Decrease potassium to 1 pill every morning (reduced from 2 in the morning and 1 in the afternoon).  I will be setting up outpatient iron infusion.

## 2022-08-26 NOTE — Telephone Encounter (Signed)
 Error - duplicate

## 2022-08-27 ENCOUNTER — Ambulatory Visit: Payer: Self-pay | Admitting: *Deleted

## 2022-08-27 ENCOUNTER — Other Ambulatory Visit (HOSPITAL_COMMUNITY): Payer: Self-pay

## 2022-08-27 ENCOUNTER — Telehealth: Payer: Self-pay | Admitting: Internal Medicine

## 2022-08-27 ENCOUNTER — Telehealth: Payer: Self-pay | Admitting: Specialist

## 2022-08-27 NOTE — Telephone Encounter (Signed)
Spoke to Ms. Elholy who received and understood my message with medication instructions. Linzess is helping. Appt made with new pulmonologist in 10/2022. Hasn't heard from podiatry or vascular about expedited appts with regard to her two pain.  I will call those offices.  I had already left a message at [facility] requesting an expedited appt.

## 2022-08-27 NOTE — Patient Instructions (Signed)
Visit Information  Thank you for taking time to visit with me today. Please don't hesitate to contact me if I can be of assistance to you.   Following are the goals we discussed today:   Goals Addressed             This Visit's Progress    Provide support, resources and mental health support       Activities and task to complete in order to accomplish goals.    Glad you are scheduled with new Pulmonologist and appointment set (10/21/22) Continue working with the Aging Gracefully/[facility] team on plans to move forward with home repairs/adaptations to be made-  Call 988 anytime 24/7 for mental health crisis support Continue with positive self talk Try listening to music/tv, coloring or other activities to help distract you from feelings of worry, sadness, etc. Call insurance Concierge and inquire about copay for counseling- I am happy to help connect you if agreeable- PCP to complete revised SCAT application for hopeful approval of door to door service and not curbside- I will pickup and fax to [facility]  Consider participating in activities outside the home- [facility],etc Follow up with [employer] program for in-home help/possibly rides too- maybe they can help with the cleaning of walls(smoke) Work on the decluttering goal we discussed- 1bag/box on street for bulk pickup trash day Continue to be productive  Follow up visits planned for [facility], ophthalmologist, pulmonary and vascular-          Our next appointment is by telephone on 09/05/22 Please call the care guide team at [phone] if you need to cancel or reschedule your appointment.   If you are experiencing a Mental Health or Behavioral Health Crisis or need someone to talk to, please call the Suicide and Crisis Lifeline: 988 call 911   The patient verbalized understanding of instructions, educational materials, and care plan provided today and DECLINED offer to receive copy of patient instructions,  educational materials, and care plan.   Telephone follow up appointment with care management team member scheduled for:  09/05/22  Reece Levy, MSW, LCSW Clinical Social Worker [facility] [phone]

## 2022-08-27 NOTE — Telephone Encounter (Signed)
Called [facility] to request an acute visit for toe pain - receptionist has scheduled her for Monday 22nd and will call her.

## 2022-08-27 NOTE — Patient Outreach (Signed)
  Care Coordination   Follow Up Visit Note   08/27/2022 Name: [name] MRN: [id number] DOB: 10/28/1950  [name] is a 72 y.o. year old female who sees [name], [name], MD for primary care. I spoke with  [name] by phone today.  What matters to the patients health and wellness today?  Saw PCP and hemoglobin is low so having an infusion.    Goals Addressed             This Visit's Progress    Provide support, resources and mental health support       Activities and task to complete in order to accomplish goals.    Glad you are scheduled with new Pulmonologist and appointment set (10/21/22) Continue working with the Aging Gracefully/[facility] team on plans to move forward with home repairs/adaptations to be made-  Call 988 anytime 24/7 for mental health crisis support Continue with positive self talk Try listening to music/tv, coloring or other activities to help distract you from feelings of worry, sadness, etc. Call insurance Concierge and inquire about copay for counseling- I am happy to help connect you if agreeable- PCP to complete revised SC[employer] application for hopeful approval of door to door service and not curbside- I will pickup and fax to [employer]  Consider participating in activities outside the home- [facility],etc Follow up with [employer] program for in-home help/possibly rides too- maybe they can help with the cleaning of walls(smoke) Work on the decluttering goal we discussed- 1bag/box on street for bulk pickup trash day Continue to be productive  Follow up visits planned for mammorgram, ophthalmologist, pulmonary and vascular-          SDOH assessments and interventions completed:  Yes     Care Coordination Interventions:  Yes, provided  Interventions Today    Flowsheet Row Most Recent Value  Chronic Disease   Chronic disease during today's visit Other  [depression]  General Interventions   Doctor  Visits Discussed/Reviewed Doctor Visits Discussed, Specialist  [Pt shared she is scheduled for her mammogram, vision check, vascular and new pulmonary MD.]  Durable Medical Equipment (DME) Other  [[facility] is working with pt to assist with home modifications. Pt has changed her mind and now does want the Bidet- CSW has sent message today to the Aging [facility]full[facility] OT to advise]  PCP/Specialist Visits Compliance with follow-up visit  Mental Health Interventions   Mental Health Discussed/Reviewed Mental Health Discussed, Depression, Coping Strategies  [Pt reminded to call [employer] to inquire about her copay for outpatient counseling]       Follow up plan: Follow up call scheduled for 09/05/22    Encounter Outcome:  Pt. Visit Completed

## 2022-08-27 NOTE — Telephone Encounter (Signed)
Patient called stating that she has changed her mind on Aging Gracefully goals and would now like to do the bidet attachment if not too late.  OT has returned item and educated patient that we would work through other options to address this deficit.  Patient agreeable to plan.  Patient requested call back beginning of week of 7/21 to schedule next aging gracefully OT visit. [name], MHA, OT/L [phone]

## 2022-08-28 ENCOUNTER — Other Ambulatory Visit: Payer: Self-pay

## 2022-08-29 ENCOUNTER — Other Ambulatory Visit: Payer: Self-pay

## 2022-09-01 ENCOUNTER — Ambulatory Visit: Payer: PPO | Admitting: Podiatry

## 2022-09-01 ENCOUNTER — Other Ambulatory Visit: Payer: Self-pay

## 2022-09-01 ENCOUNTER — Telehealth: Payer: Self-pay | Admitting: Pharmacist

## 2022-09-01 ENCOUNTER — Other Ambulatory Visit (HOSPITAL_COMMUNITY): Payer: Self-pay

## 2022-09-01 DIAGNOSIS — E1151 Type 2 diabetes mellitus with diabetic peripheral angiopathy without gangrene: Secondary | ICD-10-CM

## 2022-09-01 DIAGNOSIS — E1142 Type 2 diabetes mellitus with diabetic polyneuropathy: Secondary | ICD-10-CM | POA: Diagnosis not present

## 2022-09-01 DIAGNOSIS — B351 Tinea unguium: Secondary | ICD-10-CM | POA: Diagnosis not present

## 2022-09-01 DIAGNOSIS — L84 Corns and callosities: Secondary | ICD-10-CM | POA: Diagnosis not present

## 2022-09-01 MED ORDER — LIDOCAINE-PRILOCAINE 2.5-2.5 % EX CREA
1.0000 | TOPICAL_CREAM | CUTANEOUS | 0 refills | Status: DC | PRN
  Filled 2022-09-01: qty 30, 30d supply, fill #0

## 2022-09-01 NOTE — Telephone Encounter (Signed)
Patient provides PST FS POC INR result of 2.5 (Target range 2.0 - 2.5) on 12.5mg  warfarin per week. REDUCED to 11.25 milligrams of warfarin per week and will repeat INR 29JUL24.

## 2022-09-01 NOTE — Progress Notes (Signed)
Subjective:  Patient ID: [name], female    DOB: 04/01/1950,  MRN: [id number]  [name] presents to clinic today for:  Chief Complaint  Patient presents with   Nail Problem    Pt is here for [facility] and also because he is having pain on her right great to next to the nail. She also0 states she sees her nails are starting to lift on the sides.  . Patient notes nails are thick and elongated, causing pain in shoe gear when ambulating.  She also has painful calluses on the ball of both feet.  She has pain on the medial aspect of the right great toe.  States her primary care physician thought it was an ingrown nail.  She is not sure if that is the case.  She feels like it might be due to her neuropathy.  She does take gabapentin daily.  PCP is Miguel Aschoff, MD. date last seen was 08/21/2022  Allergies  Allergen Reactions   Dexilant [Dexlansoprazole] Other (See Comments)    Made acid reflux worse   Lorazepam Other (See Comments)    Patient's sister noted that ativan caused the patient to become extremely confused during hospitalization 09/2010; tolerates Xanax   Oxycontin [Oxycodone] Other (See Comments)    headache   Tramadol Hcl Swelling    Ankle swelling    Review of Systems: Negative except as noted in the HPI.  Objective:  There were no vitals filed for this visit.  [name] is a pleasant 72 y.o. female in NAD. AAO x 3.  Vascular Examination: Patient has palpable DP pulse, absent PT pulse bilateral.  Delayed capillary refill bilateral toes.  Sparse digital hair bilateral.  Proximal to distal cooling WNL bilateral.    Dermatological Examination: Interspaces are clear with no open lesions noted bilateral.  Nails are 3-44mm thick, with yellowish/brown discoloration, subungual debris and distal onycholysis x10.  There is pain with compression of nails x10.  There are hyperkeratotic lesions noted submet 1 bilateral.  There is pain on  palpation along the medial aspect of the right hallux nail margin.  No surrounding erythema or edema is noted.  Neurological Examination: Protective sensation intact b/l LE. Vibratory sensation diminished b/l LE.  Light touch sensation diminished bilateral  Musculoskeletal Examination: Muscle strength 5/5 to all LE muscle groups b/l.      Latest Ref Rng & Units 07/24/2022   11:28 AM 05/01/2022   10:26 AM 01/16/2022   11:02 AM 11/07/2021    9:30 AM  Hemoglobin A1C  Hemoglobin-A1c 4.0 - 5.6 % 7.4  7.5  7.1  6.6    Patient qualifies for at-risk foot care because of diabetes with PVD.  Assessment/Plan: 1. Dermatophytosis of nail   2. Type II diabetes mellitus with peripheral circulatory disorder (HCC)   3. Callus of foot   4. Diabetic polyneuropathy associated with type 2 diabetes mellitus (HCC)     Meds ordered this encounter  Medications   lidocaine-prilocaine (EMLA) cream    Sig: Apply 1 Application topically as needed.    Dispense:  30 g    Refill:  0   Mycotic nails x10 were sharply debrided with sterile nail nippers and power debriding burr to decrease bulk and length.  Hyperkeratotic lesions x 2 were shaved with #312 blade.  Informed patient that I did not feel that the right hallux medial nail pain is neuropathy.  This is due to when the nail was debrided the medial  border was cut back along the distal margin and antibiotic ointment applied.  There was some hardened skin in the nail groove which was also removed with a sterile #312 blade.  Following this, when the patient pushed on her toe, she no longer had pain in that area.  However, if the patient does develop pain in the toe after her visit today, Emla cream was sent to her pharmacy that she can apply 2-3 times daily as needed to manage the pain in the toe.  Continue with gabapentin daily as prescribed by her PCP  She is already scheduled for her next [facility] appointment in 3 months.   Clerance Lav, DPM, FACFAS [facility]     [address] [place], [place] [address]                Office [phone]  Fax [phone]

## 2022-09-04 ENCOUNTER — Ambulatory Visit: Payer: PPO

## 2022-09-05 ENCOUNTER — Other Ambulatory Visit: Payer: Self-pay

## 2022-09-05 ENCOUNTER — Other Ambulatory Visit (HOSPITAL_COMMUNITY): Payer: Self-pay

## 2022-09-05 ENCOUNTER — Ambulatory Visit: Payer: Self-pay | Admitting: *Deleted

## 2022-09-05 DIAGNOSIS — F419 Anxiety disorder, unspecified: Secondary | ICD-10-CM

## 2022-09-05 DIAGNOSIS — G8929 Other chronic pain: Secondary | ICD-10-CM

## 2022-09-05 NOTE — Patient Instructions (Signed)
Visit Information  Thank you for taking time to visit with me today. Please don't hesitate to contact me if I can be of assistance to you.   Following are the goals we discussed today:   Goals Addressed             This Visit's Progress    Provide support, resources and mental health support       Activities and task to complete in order to accomplish goals.    Glad you are scheduled with new Pulmonologist and appointment set (10/21/22) Continue working with the [facility] team on plans to move forward with home repairs/adaptations to be made- make CHS aware of questions/needs Call [phone] anytime 24/7 for mental health crisis support Continue with positive self talk Try listening to music/tv, coloring or other activities to help distract you from feelings of worry, sadness, etc. Call insurance Concierge and inquire about copay for counseling- I am happy to help connect you if agreeable- PCP to complete revised SCAT application for hopeful approval of door to door service and not curbside- I will pickup and fax to [facility]  Consider participating in activities outside the home- [facility],etc Follow up with [employer] program for in-home help/possibly rides too- maybe they can help with the cleaning of walls(smoke) Work on the decluttering goal we discussed- 1bag/box on street for bulk pickup trash day Continue to be productive  Follow up visits planned for [facility], ophthalmologist, pulmonary and vascular-          Our next appointment is by telephone on 09/26/22   Please call the care guide team at [phone] if you need to cancel or reschedule your appointment.   If you are experiencing a Mental Health or Behavioral Health Crisis or need someone to talk to, please call the Suicide and Crisis Lifeline: 988 call 911   The patient verbalized understanding of instructions, educational materials, and care plan provided today and DECLINED offer to receive  copy of patient instructions, educational materials, and care plan.   The patient will call [facility] for ramp assistance request- [phone]  [email or web address]   Reece Levy, MSW, LCSW Clinical Social Worker [facility] [phone]

## 2022-09-05 NOTE — Patient Outreach (Signed)
  Care Coordination   Follow Up Visit Note   09/05/2022 Name: [name] MRN: [id number] DOB: [birth year]  [name] is a 72 y.o. year old female who sees [name], [name], MD for primary care. I spoke with  [name] by phone today.  What matters to the patients health and wellness today?  Need help with a ramp too.     Goals Addressed             This Visit's Progress    Provide support, resources and mental health support       Activities and task to complete in order to accomplish goals.    Glad you are scheduled with new Pulmonologist and appointment set (10/21/22) Continue working with the [facility] team on plans to move forward with home repairs/adaptations to be made- make CHS aware of questions/needs Call [phone] anytime 24/7 for mental health crisis support Continue with positive self talk Try listening to music/tv, coloring or other activities to help distract you from feelings of worry, sadness, etc. Call insurance Concierge and inquire about copay for counseling- I am happy to help connect you if agreeable- PCP to complete revised SCAT application for hopeful approval of door to door service and not curbside- I will pickup and fax to [employer]  Consider participating in activities outside the home- [facility],etc Follow up with [employer] program for in-home help/possibly rides too- maybe they can help with the cleaning of walls(smoke) Work on the decluttering goal we discussed- 1bag/box on street for bulk pickup trash day Continue to be productive  Follow up visits planned for mammorgram, ophthalmologist, pulmonary and vascular-          SDOH assessments and interventions completed:  Yes     Care Coordination Interventions:  Yes, provided  Interventions Today    Flowsheet Row Most Recent Value  Chronic Disease   Chronic disease during today's visit Other  [depression]  General Interventions    General Interventions Discussed/Reviewed General Interventions Discussed, [facility], Doctor Visits, Communication with, Horticulturist, commercial (DME), Level of Care  [Pt continues to work with [facility] and [facility] for home modifications- Pt also requesting Oxygen and indicates her equipment is broken and wants to not use [employer]- suggested she call and ask them to pick it up. CSW to alert PCP]  Durable Medical Equipment (DME) Oxygen  [Pt reports her unit is broken. Needs a new DME  provider/order sent by Provider- CSW will ask PCP]  Level of Care Applications  Applications Other  [PCP to complete/revise SCAT application as pt needs "door to door" service.  Pt also indicates she needs a ramp- unsure if CHS can assist wtih that also-]  Education Interventions   Applications Other  [PCP to complete/revise SCAT application as pt needs "door to door" service.  Pt also indicates she needs a ramp- unsure if [facility]S can assist wtih that also-]  Mental Health Interventions   Mental Health Discussed/Reviewed Coping Strategies  [Pt in good spirits- still considering counseling referral- pt to call insurance to confirm copay]  Safety Interventions   Safety Discussed/Reviewed Safety Discussed       Follow up plan: Follow up call scheduled for 09/26/22    Encounter Outcome:  Pt. Visit Completed

## 2022-09-06 ENCOUNTER — Other Ambulatory Visit: Payer: Self-pay | Admitting: Internal Medicine

## 2022-09-07 ENCOUNTER — Other Ambulatory Visit: Payer: Self-pay

## 2022-09-08 ENCOUNTER — Other Ambulatory Visit (HOSPITAL_COMMUNITY): Payer: Self-pay

## 2022-09-08 ENCOUNTER — Other Ambulatory Visit: Payer: Self-pay

## 2022-09-08 DIAGNOSIS — I480 Paroxysmal atrial fibrillation: Secondary | ICD-10-CM | POA: Diagnosis not present

## 2022-09-08 DIAGNOSIS — Z7901 Long term (current) use of anticoagulants: Secondary | ICD-10-CM | POA: Diagnosis not present

## 2022-09-08 MED ORDER — KETOCONAZOLE 2 % EX CREA
1.0000 | TOPICAL_CREAM | Freq: Every evening | CUTANEOUS | 5 refills | Status: DC | PRN
  Filled 2022-09-08: qty 60, 30d supply, fill #0
  Filled 2023-01-19: qty 60, 30d supply, fill #1

## 2022-09-08 NOTE — Telephone Encounter (Signed)
Patient provides results of PST FS POC INR value = 2.20 (Target range 2.0 - 2.5) on 2.5mg  warfarin (1 tab) on Sundays/Wednesdays; 1/2 x 2.5mg  (1.25mg  dose) ALL OTHER DAYS. Continue SAME regimen. Repeat FS INR 5-AUG-24.

## 2022-09-09 ENCOUNTER — Other Ambulatory Visit (HOSPITAL_COMMUNITY): Payer: Self-pay

## 2022-09-09 ENCOUNTER — Other Ambulatory Visit: Payer: Self-pay

## 2022-09-09 ENCOUNTER — Other Ambulatory Visit: Payer: Self-pay | Admitting: Internal Medicine

## 2022-09-09 DIAGNOSIS — G4734 Idiopathic sleep related nonobstructive alveolar hypoventilation: Secondary | ICD-10-CM

## 2022-09-09 DIAGNOSIS — D509 Iron deficiency anemia, unspecified: Secondary | ICD-10-CM

## 2022-09-09 IMAGING — DX DG CHEST 2V
2 series · 2 of 2 positions shown · non-contrast
Comparison: February 02, 2020.

CLINICAL DATA: COPD.

EXAM:
CHEST - 2 VIEW

[chest pa]
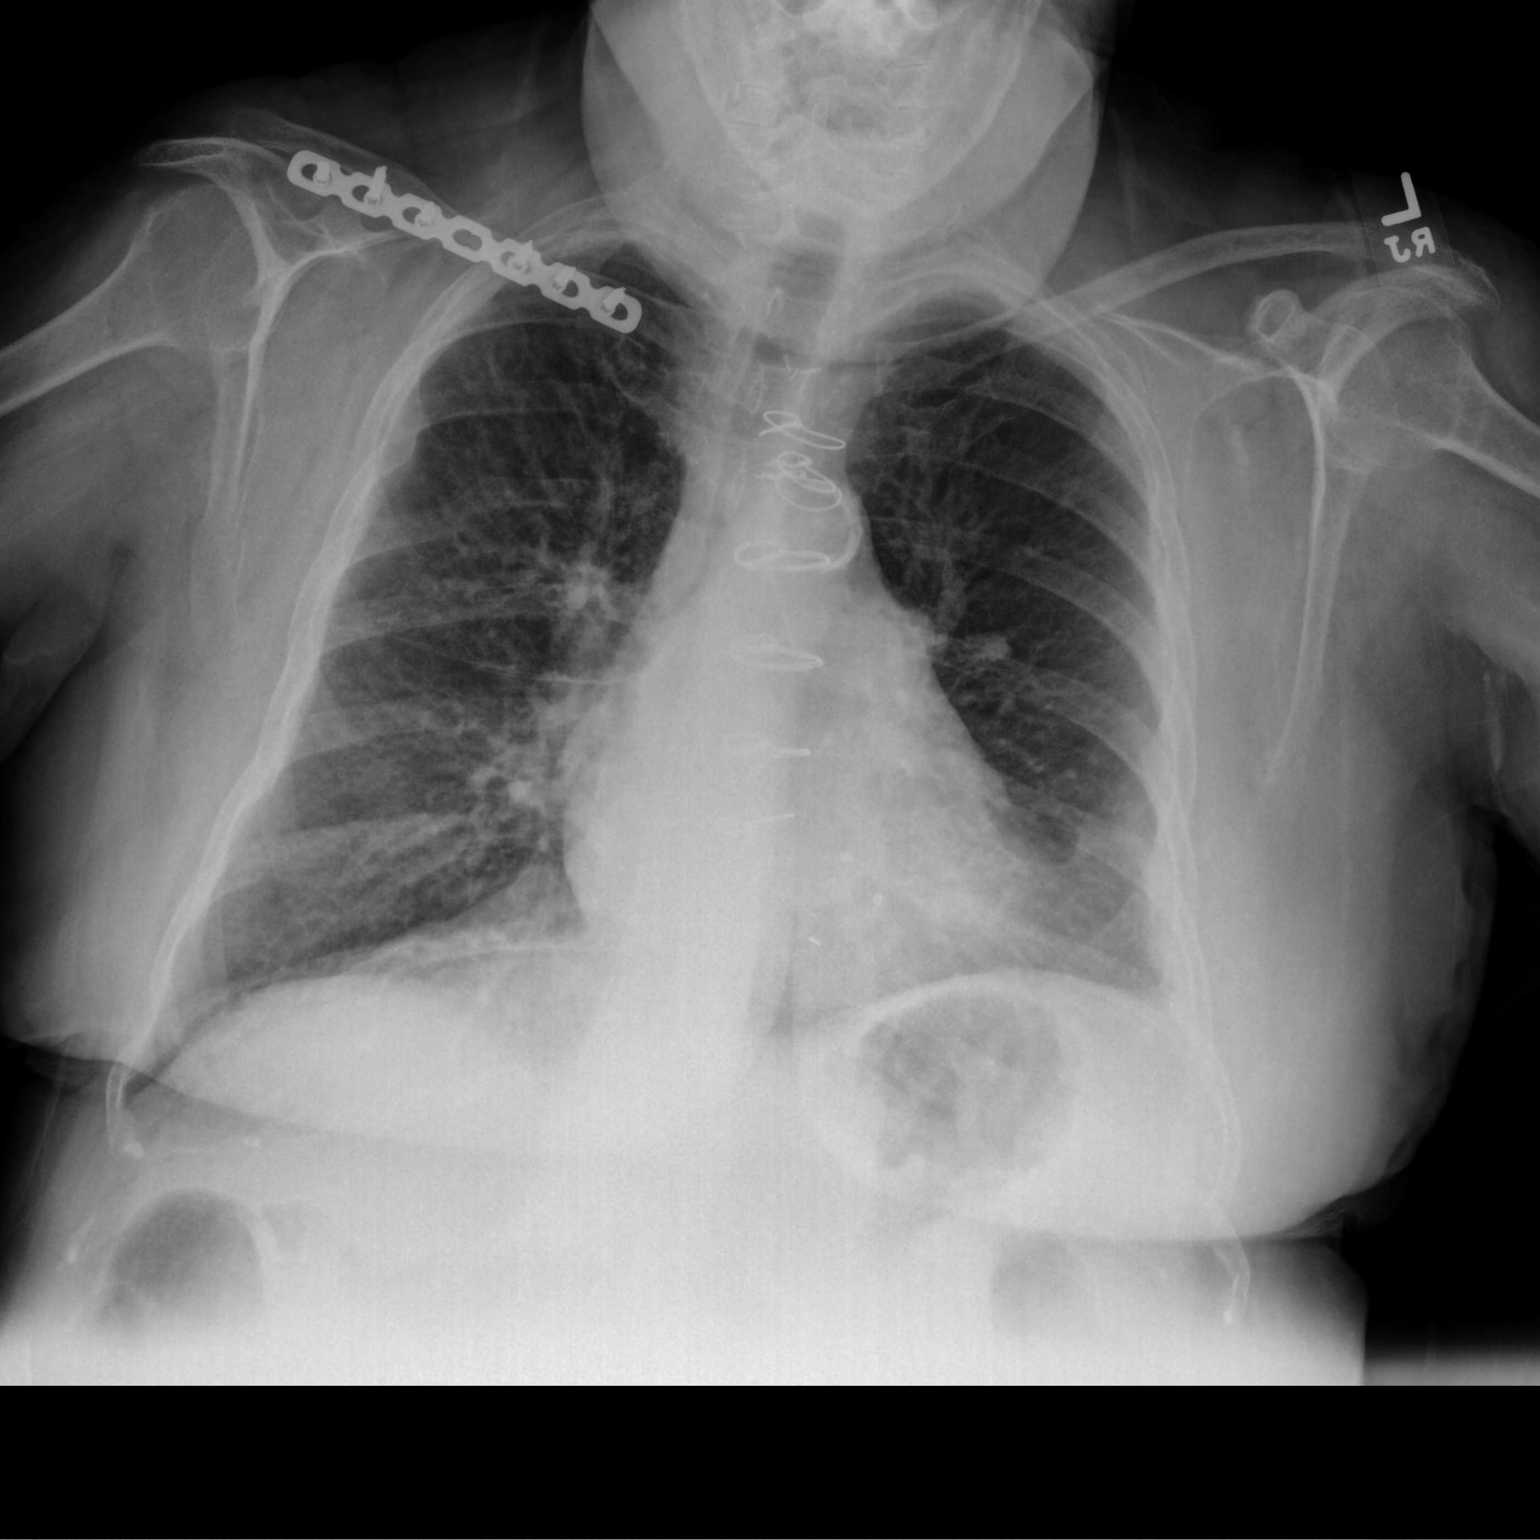

[chest lat]
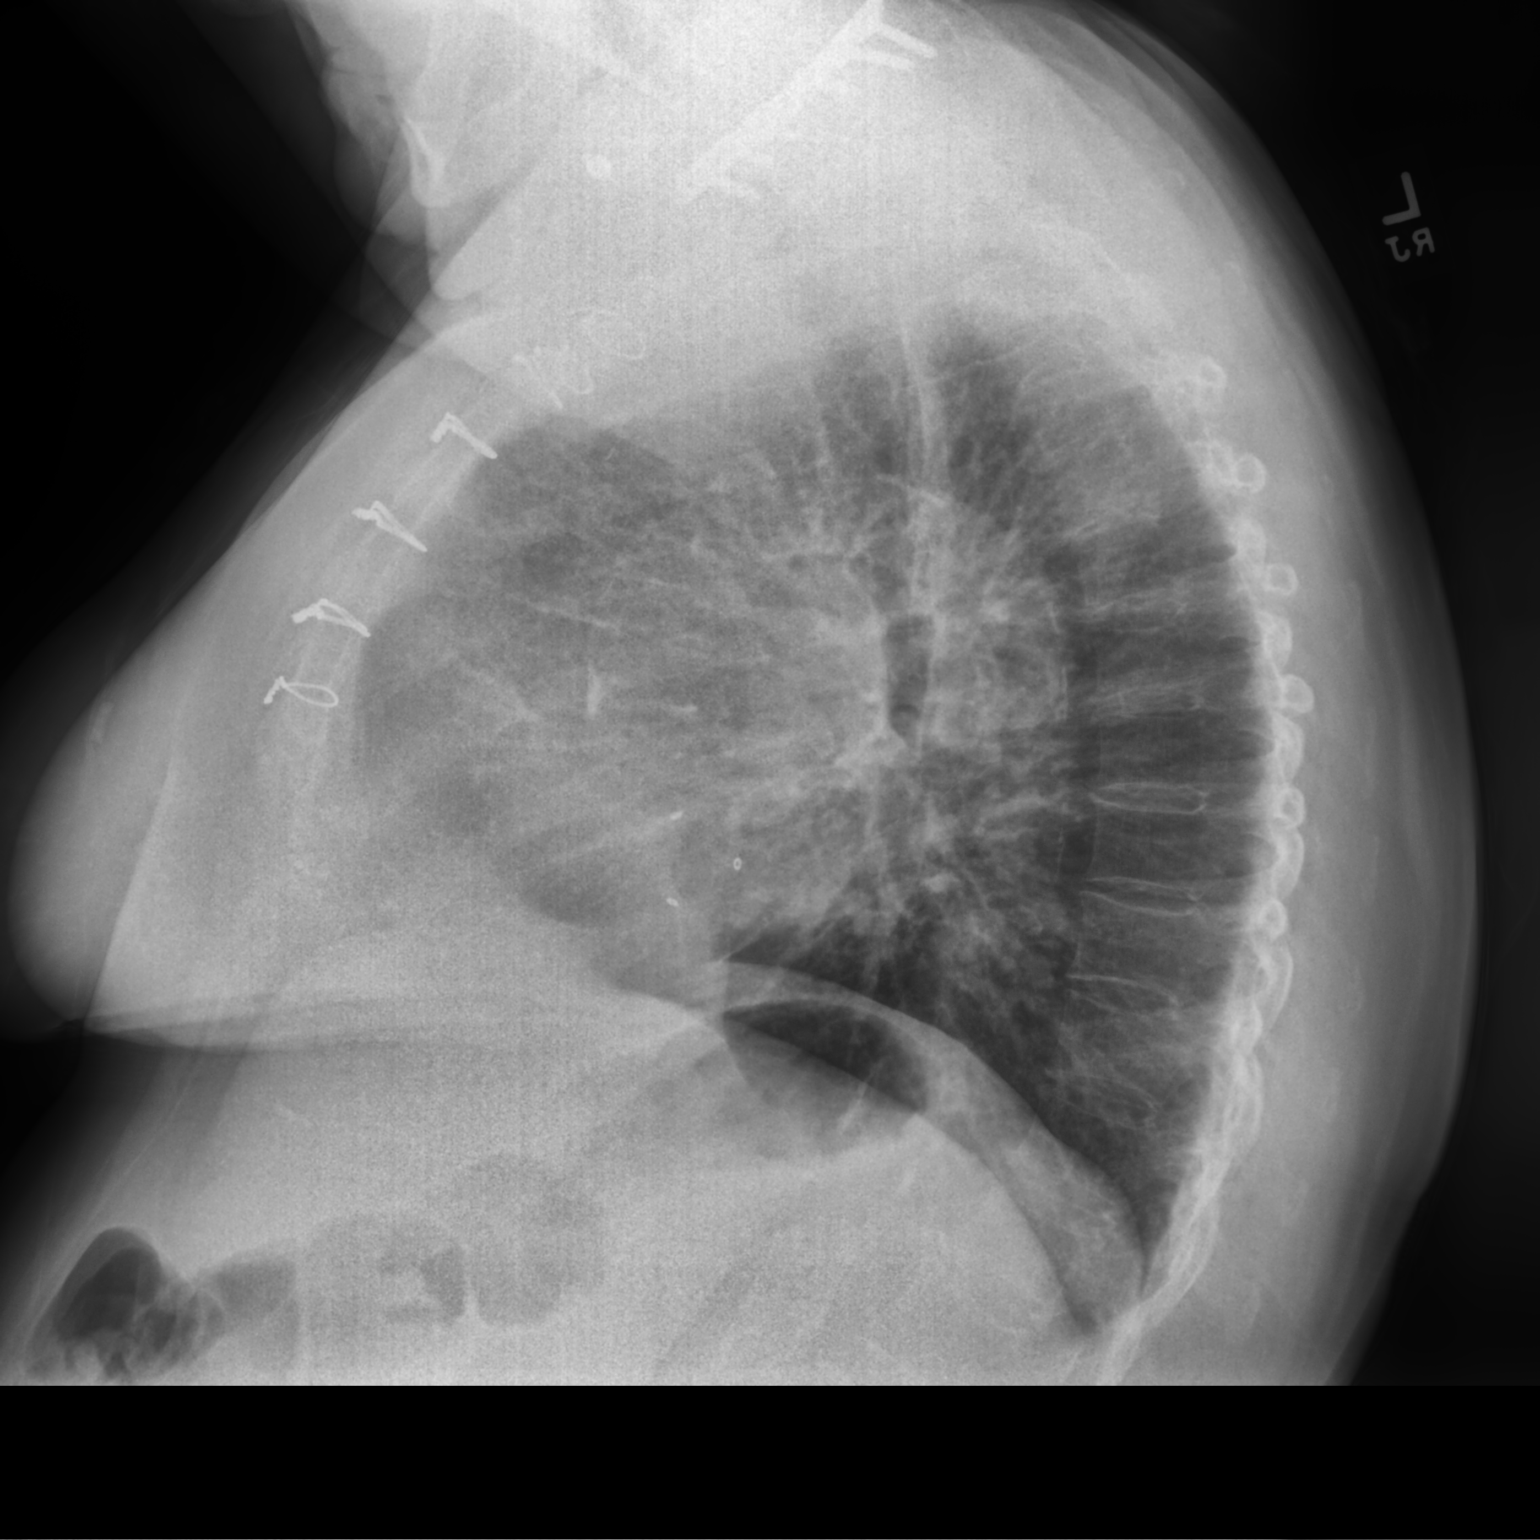

[2 of 2 positions shown; findings below may reference images not displayed]

FINDINGS: The heart size and mediastinal contours are within normal limits.
Both lungs are clear. No pneumothorax or pleural effusion is noted.
Sternotomy wires are noted. The visualized skeletal structures are
unremarkable.
IMPRESSION: No active cardiopulmonary disease.

Aortic Atherosclerosis (ES291-HPR.R).

## 2022-09-09 MED ORDER — ALPRAZOLAM 1 MG PO TABS
0.5000 mg | ORAL_TABLET | Freq: Every evening | ORAL | 0 refills | Status: DC | PRN
Start: 2022-09-09 — End: 2022-10-09
  Filled 2022-09-09: qty 45, 30d supply, fill #0

## 2022-09-09 MED ORDER — HYDROCODONE-ACETAMINOPHEN 5-325 MG PO TABS
1.0000 | ORAL_TABLET | Freq: Three times a day (TID) | ORAL | 0 refills | Status: DC
Start: 2022-09-09 — End: 2022-10-22
  Filled 2022-09-09: qty 180, 30d supply, fill #0

## 2022-09-11 DIAGNOSIS — G4733 Obstructive sleep apnea (adult) (pediatric): Secondary | ICD-10-CM | POA: Diagnosis not present

## 2022-09-15 ENCOUNTER — Telehealth: Payer: Self-pay | Admitting: Pharmacist

## 2022-09-15 NOTE — Telephone Encounter (Signed)
Patient provides PST FS POC INR = 2.7 (target range 2.0 - 2.5) on 11.25mg  warfarin/wk. REDUCED to 10mg  warfarin/wk, omitting today's dose. Repeat INR 12AUG24. No bleeding, no new medications she states. Adequate supply on-hand.

## 2022-09-18 ENCOUNTER — Ambulatory Visit: Payer: PPO

## 2022-09-19 ENCOUNTER — Other Ambulatory Visit (HOSPITAL_COMMUNITY): Payer: Self-pay

## 2022-09-22 ENCOUNTER — Telehealth: Payer: Self-pay | Admitting: Pharmacist

## 2022-09-22 ENCOUNTER — Telehealth: Payer: Self-pay

## 2022-09-22 NOTE — Telephone Encounter (Signed)
Patient provides results of FS POC PST INR = 2.2 on 10 mg warfarin/wk as: Su-2.5mg M-0mg T-1.25mg W-2.5mg Th-1.25mg F-1.25mg Sa-1.25mg  Will CONTINUE this regimen, repeat INR 19AUG24.No bleeding, no new meds, no missed doses. Adequate supply on-hand.

## 2022-09-22 NOTE — Patient Outreach (Signed)
[facility]  09/22/2022  Allyn Balogun 1950-10-14 [id number]  Message from office that patient wanted a call. Placed call to patient who reports that she wants to go out of town and would like to reschedule.  Appointment changed.  Rowe Pavy RN, BSN, Careers adviser for [employer] [facility]: [phone]

## 2022-09-23 ENCOUNTER — Other Ambulatory Visit (HOSPITAL_COMMUNITY): Payer: Self-pay

## 2022-09-23 ENCOUNTER — Other Ambulatory Visit: Payer: Self-pay

## 2022-09-25 ENCOUNTER — Encounter (HOSPITAL_COMMUNITY): Payer: Self-pay | Admitting: Infectious Diseases

## 2022-09-25 ENCOUNTER — Other Ambulatory Visit: Payer: Self-pay

## 2022-09-25 ENCOUNTER — Ambulatory Visit: Admission: RE | Admit: 2022-09-25 | Payer: PPO | Source: Ambulatory Visit

## 2022-09-25 ENCOUNTER — Ambulatory Visit: Payer: PPO

## 2022-09-25 ENCOUNTER — Ambulatory Visit (INDEPENDENT_AMBULATORY_CARE_PROVIDER_SITE_OTHER): Payer: PPO | Admitting: Internal Medicine

## 2022-09-25 ENCOUNTER — Inpatient Hospital Stay (HOSPITAL_COMMUNITY)
Admission: RE | Admit: 2022-09-25 | Discharge: 2022-09-29 | DRG: 812 | Disposition: A | Discharge status: Home Health | Payer: PPO | Source: Ambulatory Visit | Attending: Infectious Diseases | Admitting: Infectious Diseases

## 2022-09-25 ENCOUNTER — Encounter (HOSPITAL_COMMUNITY): Payer: Self-pay

## 2022-09-25 VITALS — BP 142/30 | HR 67 | Temp 97.4°F | Ht 62.0 in | Wt 160.9 lb

## 2022-09-25 DIAGNOSIS — M79675 Pain in left toe(s): Secondary | ICD-10-CM

## 2022-09-25 DIAGNOSIS — Z7901 Long term (current) use of anticoagulants: Secondary | ICD-10-CM

## 2022-09-25 DIAGNOSIS — Z885 Allergy status to narcotic agent status: Secondary | ICD-10-CM

## 2022-09-25 DIAGNOSIS — I4811 Longstanding persistent atrial fibrillation: Secondary | ICD-10-CM

## 2022-09-25 DIAGNOSIS — I272 Pulmonary hypertension, unspecified: Secondary | ICD-10-CM | POA: Diagnosis present

## 2022-09-25 DIAGNOSIS — Z79899 Other long term (current) drug therapy: Secondary | ICD-10-CM

## 2022-09-25 DIAGNOSIS — F17210 Nicotine dependence, cigarettes, uncomplicated: Secondary | ICD-10-CM | POA: Diagnosis present

## 2022-09-25 DIAGNOSIS — Z961 Presence of intraocular lens: Secondary | ICD-10-CM | POA: Diagnosis present

## 2022-09-25 DIAGNOSIS — I050 Rheumatic mitral stenosis: Secondary | ICD-10-CM | POA: Diagnosis present

## 2022-09-25 DIAGNOSIS — D509 Iron deficiency anemia, unspecified: Secondary | ICD-10-CM | POA: Diagnosis present

## 2022-09-25 DIAGNOSIS — K5909 Other constipation: Secondary | ICD-10-CM | POA: Diagnosis not present

## 2022-09-25 DIAGNOSIS — F17200 Nicotine dependence, unspecified, uncomplicated: Secondary | ICD-10-CM | POA: Diagnosis present

## 2022-09-25 DIAGNOSIS — Z888 Allergy status to other drugs, medicaments and biological substances status: Secondary | ICD-10-CM

## 2022-09-25 DIAGNOSIS — Z7985 Long-term (current) use of injectable non-insulin antidiabetic drugs: Secondary | ICD-10-CM | POA: Diagnosis not present

## 2022-09-25 DIAGNOSIS — Z8673 Personal history of transient ischemic attack (TIA), and cerebral infarction without residual deficits: Secondary | ICD-10-CM

## 2022-09-25 DIAGNOSIS — J449 Chronic obstructive pulmonary disease, unspecified: Secondary | ICD-10-CM

## 2022-09-25 DIAGNOSIS — I5032 Chronic diastolic (congestive) heart failure: Secondary | ICD-10-CM

## 2022-09-25 DIAGNOSIS — M79674 Pain in right toe(s): Secondary | ICD-10-CM | POA: Diagnosis not present

## 2022-09-25 DIAGNOSIS — Z794 Long term (current) use of insulin: Secondary | ICD-10-CM | POA: Diagnosis not present

## 2022-09-25 DIAGNOSIS — G4733 Obstructive sleep apnea (adult) (pediatric): Secondary | ICD-10-CM | POA: Diagnosis present

## 2022-09-25 DIAGNOSIS — N1831 Chronic kidney disease, stage 3a: Secondary | ICD-10-CM

## 2022-09-25 DIAGNOSIS — I110 Hypertensive heart disease with heart failure: Secondary | ICD-10-CM | POA: Diagnosis present

## 2022-09-25 DIAGNOSIS — Z9842 Cataract extraction status, left eye: Secondary | ICD-10-CM

## 2022-09-25 DIAGNOSIS — Z9841 Cataract extraction status, right eye: Secondary | ICD-10-CM

## 2022-09-25 DIAGNOSIS — E1169 Type 2 diabetes mellitus with other specified complication: Secondary | ICD-10-CM | POA: Diagnosis present

## 2022-09-25 DIAGNOSIS — E118 Type 2 diabetes mellitus with unspecified complications: Secondary | ICD-10-CM | POA: Diagnosis not present

## 2022-09-25 DIAGNOSIS — I10 Essential (primary) hypertension: Secondary | ICD-10-CM | POA: Diagnosis not present

## 2022-09-25 DIAGNOSIS — K5900 Constipation, unspecified: Secondary | ICD-10-CM | POA: Diagnosis present

## 2022-09-25 DIAGNOSIS — R0902 Hypoxemia: Secondary | ICD-10-CM | POA: Diagnosis present

## 2022-09-25 DIAGNOSIS — D649 Anemia, unspecified: Secondary | ICD-10-CM | POA: Diagnosis present

## 2022-09-25 DIAGNOSIS — J441 Chronic obstructive pulmonary disease with (acute) exacerbation: Secondary | ICD-10-CM | POA: Diagnosis not present

## 2022-09-25 DIAGNOSIS — T402X5A Adverse effect of other opioids, initial encounter: Secondary | ICD-10-CM | POA: Diagnosis present

## 2022-09-25 DIAGNOSIS — G4734 Idiopathic sleep related nonobstructive alveolar hypoventilation: Secondary | ICD-10-CM | POA: Diagnosis not present

## 2022-09-25 DIAGNOSIS — Z85528 Personal history of other malignant neoplasm of kidney: Secondary | ICD-10-CM

## 2022-09-25 DIAGNOSIS — E1165 Type 2 diabetes mellitus with hyperglycemia: Secondary | ICD-10-CM | POA: Diagnosis present

## 2022-09-25 DIAGNOSIS — D62 Acute posthemorrhagic anemia: Secondary | ICD-10-CM | POA: Diagnosis present

## 2022-09-25 DIAGNOSIS — Z953 Presence of xenogenic heart valve: Secondary | ICD-10-CM

## 2022-09-25 LAB — COMPREHENSIVE METABOLIC PANEL
ALT: 10 U/L (ref 0–44)
AST: 9 U/L — ABNORMAL LOW (ref 15–41)
Albumin: 3.3 g/dL — ABNORMAL LOW (ref 3.5–5.0)
Alkaline Phosphatase: 54 U/L (ref 38–126)
Anion gap: 9 (ref 5–15)
BUN: 15 mg/dL (ref 8–23)
CO2: 24 mmol/L (ref 22–32)
Calcium: 8.8 mg/dL — ABNORMAL LOW (ref 8.9–10.3)
Chloride: 105 mmol/L (ref 98–111)
Creatinine, Ser: 1.13 mg/dL — ABNORMAL HIGH (ref 0.44–1.00)
GFR, Estimated: 51 mL/min — ABNORMAL LOW (ref >60)
Glucose, Bld: 187 mg/dL — ABNORMAL HIGH (ref 70–99)
Potassium: 3.9 mmol/L (ref 3.5–5.1)
Sodium: 138 mmol/L (ref 135–145)
Total Bilirubin: 0.4 mg/dL (ref 0.3–1.2)
Total Protein: 6.3 g/dL — ABNORMAL LOW (ref 6.5–8.1)

## 2022-09-25 LAB — CBC
HCT: 23.9 % — ABNORMAL LOW (ref 36.0–46.0)
Hemoglobin: 6.4 g/dL — CL (ref 12.0–15.0)
MCH: 20.7 pg — ABNORMAL LOW (ref 26.0–34.0)
MCHC: 26.8 g/dL — ABNORMAL LOW (ref 30.0–36.0)
MCV: 77.3 fL — ABNORMAL LOW (ref 80.0–100.0)
Platelets: 239 10*3/uL (ref 150–400)
RBC: 3.09 MIL/uL — ABNORMAL LOW (ref 3.87–5.11)
RDW: 18.3 % — ABNORMAL HIGH (ref 11.5–15.5)
WBC: 8.4 10*3/uL (ref 4.0–10.5)
nRBC: 0.2 % (ref 0.0–0.2)

## 2022-09-25 LAB — PROTIME-INR
INR: 1.8 — ABNORMAL HIGH (ref 0.8–1.2)
Prothrombin Time: 21.5 seconds — ABNORMAL HIGH (ref 11.4–15.2)

## 2022-09-25 LAB — OCCULT BLOOD X 1 CARD TO LAB, STOOL: Fecal Occult Bld: NEGATIVE

## 2022-09-25 LAB — BLOOD GAS, VENOUS
Acid-Base Excess: 2.3 mmol/L — ABNORMAL HIGH (ref 0.0–2.0)
Bicarbonate: 28.2 mmol/L — ABNORMAL HIGH (ref 20.0–28.0)
O2 Saturation: 17.5 %
Patient temperature: 37
pCO2, Ven: 51 mmHg (ref 44–60)
pH, Ven: 7.35 (ref 7.25–7.43)
pO2, Ven: <31 mmHg — CL (ref 32–45)

## 2022-09-25 LAB — GLUCOSE, CAPILLARY
Glucose-Capillary: 179 mg/dL — ABNORMAL HIGH (ref 70–99)
Glucose-Capillary: 223 mg/dL — ABNORMAL HIGH (ref 70–99)
Glucose-Capillary: 243 mg/dL — ABNORMAL HIGH (ref 70–99)

## 2022-09-25 LAB — BRAIN NATRIURETIC PEPTIDE: B Natriuretic Peptide: 133.8 pg/mL — ABNORMAL HIGH (ref 0.0–100.0)

## 2022-09-25 LAB — PREPARE RBC (CROSSMATCH)

## 2022-09-25 LAB — HEMOGLOBIN AND HEMATOCRIT, BLOOD
HCT: 25.9 % — ABNORMAL LOW (ref 36.0–46.0)
Hemoglobin: 7.8 g/dL — ABNORMAL LOW (ref 12.0–15.0)

## 2022-09-25 MED ORDER — IRON SUCROSE 500 MG IVPB - SIMPLE MED
500.0000 mg | Freq: Once | INTRAVENOUS | Status: AC
  Administered 2022-09-25: 500 mg via INTRAVENOUS
  Filled 2022-09-25: qty 275

## 2022-09-25 MED ORDER — DULOXETINE HCL 60 MG PO CPEP
60.0000 mg | ORAL_CAPSULE | Freq: Two times a day (BID) | ORAL | Status: DC
  Administered 2022-09-25 – 2022-09-29 (×9): 60 mg via ORAL
  Filled 2022-09-25 (×9): qty 1

## 2022-09-25 MED ORDER — AZITHROMYCIN 250 MG PO TABS
500.0000 mg | ORAL_TABLET | Freq: Every day | ORAL | Status: AC
  Administered 2022-09-25 – 2022-09-27 (×3): 500 mg via ORAL
  Filled 2022-09-25 (×3): qty 2

## 2022-09-25 MED ORDER — PREDNISONE 20 MG PO TABS
40.0000 mg | ORAL_TABLET | Freq: Every day | ORAL | Status: AC
  Administered 2022-09-25 – 2022-09-29 (×5): 40 mg via ORAL
  Filled 2022-09-25 (×5): qty 2

## 2022-09-25 MED ORDER — INSULIN ASPART 100 UNIT/ML IJ SOLN
0.0000 [IU] | Freq: Three times a day (TID) | INTRAMUSCULAR | Status: DC
  Administered 2022-09-25: 5 [IU] via SUBCUTANEOUS
  Administered 2022-09-26: 11 [IU] via SUBCUTANEOUS
  Administered 2022-09-26 (×2): 5 [IU] via SUBCUTANEOUS
  Administered 2022-09-27: 15 [IU] via SUBCUTANEOUS
  Administered 2022-09-27: 2 [IU] via SUBCUTANEOUS
  Administered 2022-09-27: 15 [IU] via SUBCUTANEOUS
  Administered 2022-09-28 (×2): 11 [IU] via SUBCUTANEOUS
  Administered 2022-09-28 – 2022-09-29 (×3): 3 [IU] via SUBCUTANEOUS

## 2022-09-25 MED ORDER — INSULIN DEGLUDEC 100 UNIT/ML ~~LOC~~ SOPN
44.0000 [IU] | PEN_INJECTOR | Freq: Every day | SUBCUTANEOUS | Status: DC
  Filled 2022-09-25: qty 3

## 2022-09-25 MED ORDER — GABAPENTIN 300 MG PO CAPS
600.0000 mg | ORAL_CAPSULE | Freq: Every day | ORAL | Status: DC
  Administered 2022-09-25 – 2022-09-28 (×4): 600 mg via ORAL
  Filled 2022-09-25 (×4): qty 2

## 2022-09-25 MED ORDER — IPRATROPIUM-ALBUTEROL 0.5-2.5 (3) MG/3ML IN SOLN
3.0000 mL | Freq: Four times a day (QID) | RESPIRATORY_TRACT | Status: AC
  Administered 2022-09-25 – 2022-09-26 (×3): 3 mL via RESPIRATORY_TRACT
  Filled 2022-09-25 (×3): qty 3

## 2022-09-25 MED ORDER — METOPROLOL SUCCINATE ER 25 MG PO TB24: 25.0000 mg | ORAL_TABLET | Freq: Every day | ORAL | Status: DC

## 2022-09-25 MED ORDER — HYDROCODONE-ACETAMINOPHEN 5-325 MG PO TABS
2.0000 | ORAL_TABLET | Freq: Three times a day (TID) | ORAL | Status: DC | PRN
  Administered 2022-09-25 – 2022-09-29 (×7): 2 via ORAL
  Filled 2022-09-25 (×7): qty 2

## 2022-09-25 MED ORDER — FUROSEMIDE 40 MG PO TABS
80.0000 mg | ORAL_TABLET | Freq: Every morning | ORAL | Status: DC
  Administered 2022-09-26 – 2022-09-29 (×4): 80 mg via ORAL
  Filled 2022-09-25 (×4): qty 2

## 2022-09-25 MED ORDER — ALPRAZOLAM 0.5 MG PO TABS
0.5000 mg | ORAL_TABLET | Freq: Every evening | ORAL | Status: DC | PRN
  Administered 2022-09-25: 0.5 mg via ORAL
  Administered 2022-09-26: 1 mg via ORAL
  Administered 2022-09-27 – 2022-09-29 (×4): 0.5 mg via ORAL
  Filled 2022-09-25 (×7): qty 1

## 2022-09-25 MED ORDER — POLYETHYLENE GLYCOL 3350 17 G PO PACK
17.0000 g | PACK | Freq: Every day | ORAL | Status: DC
  Administered 2022-09-25 – 2022-09-29 (×5): 17 g via ORAL
  Filled 2022-09-25 (×5): qty 1

## 2022-09-25 MED ORDER — ROSUVASTATIN CALCIUM 20 MG PO TABS
20.0000 mg | ORAL_TABLET | Freq: Every day | ORAL | Status: DC
  Administered 2022-09-25 – 2022-09-28 (×4): 20 mg via ORAL
  Filled 2022-09-25 (×4): qty 1

## 2022-09-25 MED ORDER — PREDNISONE 20 MG PO TABS: 40.0000 mg | ORAL_TABLET | Freq: Every day | ORAL | Status: DC

## 2022-09-25 MED ORDER — BUPROPION HCL ER (XL) 150 MG PO TB24
150.0000 mg | ORAL_TABLET | ORAL | Status: DC
  Administered 2022-09-26 – 2022-09-29 (×4): 150 mg via ORAL
  Filled 2022-09-25 (×4): qty 1

## 2022-09-25 MED ORDER — NICOTINE 21 MG/24HR TD PT24
21.0000 mg | MEDICATED_PATCH | Freq: Every day | TRANSDERMAL | Status: DC
  Administered 2022-09-25: 21 mg via TRANSDERMAL
  Filled 2022-09-25 (×2): qty 1

## 2022-09-25 MED ORDER — LINACLOTIDE 72 MCG PO CAPS
72.0000 ug | ORAL_CAPSULE | Freq: Every day | ORAL | Status: DC
  Administered 2022-09-26 – 2022-09-29 (×4): 72 ug via ORAL
  Filled 2022-09-25 (×4): qty 1

## 2022-09-25 MED ORDER — HYDROCODONE-ACETAMINOPHEN 5-325 MG PO TABS
1.0000 | ORAL_TABLET | Freq: Three times a day (TID) | ORAL | Status: DC
  Administered 2022-09-25: 2 via ORAL
  Filled 2022-09-25 (×2): qty 1

## 2022-09-25 MED ORDER — SODIUM CHLORIDE 0.9% IV SOLUTION: Freq: Once | INTRAVENOUS | Status: AC

## 2022-09-25 MED ORDER — LORATADINE 10 MG PO TABS
10.0000 mg | ORAL_TABLET | Freq: Every day | ORAL | Status: DC
  Administered 2022-09-25 – 2022-09-29 (×5): 10 mg via ORAL
  Filled 2022-09-25 (×5): qty 1

## 2022-09-25 MED ORDER — POTASSIUM CHLORIDE CRYS ER 20 MEQ PO TBCR
20.0000 meq | EXTENDED_RELEASE_TABLET | Freq: Every day | ORAL | Status: DC
  Administered 2022-09-25 – 2022-09-29 (×5): 20 meq via ORAL
  Filled 2022-09-25 (×5): qty 1

## 2022-09-25 MED ORDER — SENNA 8.6 MG PO TABS
1.0000 | ORAL_TABLET | Freq: Every day | ORAL | Status: DC
  Administered 2022-09-25 – 2022-09-29 (×5): 8.6 mg via ORAL
  Filled 2022-09-25 (×5): qty 1

## 2022-09-25 NOTE — Assessment & Plan Note (Signed)
Blood sugar data not brought today.  Her CGM sensor has been falling off.  A1c 7.4 in 07/2022, due next month.  NO change in therapy.

## 2022-09-25 NOTE — Assessment & Plan Note (Signed)
Lung exam is unchanged from last month (at which time CXR was clear).  Continues trelegy and rescue albuterol.  New albuterol MDI sent by her pharmacy is a larger and more difficult inhaler to use; given her hand weakness and pain, she cannot use it.  Has been supplementing with alb neb ave once daily.  Cough is at baseline.  Dyspnea at this time is attributed largely to her severe anemia.  Referral to new pulmonologist is scheduled for next month.  No exacerbation at this time.

## 2022-09-25 NOTE — Plan of Care (Addendum)
Direct admit patient. AOX4. Calls frequently. PIV to right arm. Blood transfusion x1 this shift. RA. SBP elevated. DBP soft. MD aware. Pt c/o constipation. Stool softeners given. Pain medication for back and right leg. C/o cramps to right leg. BS AC/HS. ISS as ordered.   Due for H/H draw at 1920.   Problem: Education: Goal: Knowledge of General Education information will improve Description: Including pain rating scale, medication(s)/side effects and non-pharmacologic comfort measures Outcome: Progressing   Problem: Health Behavior/Discharge Planning: Goal: Ability to manage health-related needs will improve Outcome: Progressing   Problem: Clinical Measurements: Goal: Ability to maintain clinical measurements within normal limits will improve Outcome: Progressing Goal: Will remain free from infection Outcome: Progressing Goal: Diagnostic test results will improve Outcome: Progressing Goal: Respiratory complications will improve Outcome: Progressing Goal: Cardiovascular complication will be avoided Outcome: Progressing   Problem: Activity: Goal: Risk for activity intolerance will decrease Outcome: Progressing   Problem: Nutrition: Goal: Adequate nutrition will be maintained Outcome: Progressing   Problem: Coping: Goal: Level of anxiety will decrease Outcome: Progressing   Problem: Elimination: Goal: Will not experience complications related to bowel motility Outcome: Progressing Goal: Will not experience complications related to urinary retention Outcome: Progressing   Problem: Pain Managment: Goal: General experience of comfort will improve Outcome: Progressing   Problem: Safety: Goal: Ability to remain free from injury will improve Outcome: Progressing   Problem: Skin Integrity: Goal: Risk for impaired skin integrity will decrease Outcome: Progressing   Problem: Education: Goal: Ability to describe self-care measures that may prevent or decrease complications  (Diabetes Survival Skills Education) will improve Outcome: Progressing Goal: Individualized Educational Video(s) Outcome: Progressing   Problem: Coping: Goal: Ability to adjust to condition or change in health will improve Outcome: Progressing   Problem: Fluid Volume: Goal: Ability to maintain a balanced intake and output will improve Outcome: Progressing   Problem: Health Behavior/Discharge Planning: Goal: Ability to identify and utilize available resources and services will improve Outcome: Progressing Goal: Ability to manage health-related needs will improve Outcome: Progressing   Problem: Metabolic: Goal: Ability to maintain appropriate glucose levels will improve Outcome: Progressing   Problem: Nutritional: Goal: Maintenance of adequate nutrition will improve Outcome: Progressing Goal: Progress toward achieving an optimal weight will improve Outcome: Progressing   Problem: Skin Integrity: Goal: Risk for impaired skin integrity will decrease Outcome: Progressing   Problem: Tissue Perfusion: Goal: Adequacy of tissue perfusion will improve Outcome: Progressing

## 2022-09-25 NOTE — Assessment & Plan Note (Signed)
Iron studies last month again show depletion of stores resulting in severe deficiency, associated in 2g drop in Hgb.  Outpatient Fe infusion ordered but not yet completed (she indicates she hadn't received a call to schedule).  Today's stat labs confirm worsened anemia with Hg 6.4.  No observed bleeding or melena.  This is a recurrence of a long term problem.  Plan admission for observation and transfusion of pRBCs.  Fe infusion also indicated though new hospital guidelines may not permit.  Severe anemia most certainly is greatest contributor to her feeling so poorly.  She typically responds well to transfusion.

## 2022-09-25 NOTE — Progress Notes (Signed)
Report called to [name], [employer].  Patient transferred via wheelchair to [address].  Alert and oriented.

## 2022-09-25 NOTE — Progress Notes (Signed)
Pt placed on CPAP for the night. Pt resting comfortable.    09/25/22 2348  BiPAP/CPAP/SIPAP  $ Non-Invasive Ventilator  Non-Invasive Vent Initial  $ Face Mask Small Yes  BiPAP/CPAP/SIPAP Pt Type Adult  BiPAP/CPAP/SIPAP Resmed  Mask Type Full face mask  Mask Size Small  EPAP 8 cmH2O  FiO2 (%) 28 %  Flow Rate 2 lpm  Nasal massage performed Yes  CPAP/SIPAP surface wiped down Yes  Safety Check Completed by RT for Home Unit Yes, no issues noted  BiPAP/CPAP /SiPAP Vitals  Pulse Rate 86  Resp 15  SpO2 94 %  Bilateral Breath Sounds Diminished

## 2022-09-25 NOTE — Assessment & Plan Note (Signed)
Evaluated by a clinical podiatrist.  Medial great toe nail trimmed a bit.  Pain has improved.  Monitor the medial nail split which could catch on her shoe and detach causing a poorly healing wound.  She is aware.

## 2022-09-25 NOTE — H&P (Signed)
Date: 09/25/2022               Patient Name:  [name] MRN: [id number]  DOB: 25-Aug-1950 Age / Sex: 72 y.o., female   PCP: Miguel Aschoff, MD         Medical Service: Internal Medicine Teaching Service         Attending Physician: Dr. Ginnie Smart, MD      First Contact: Dr. Laretta Bolster, MD Pager [phone]    Second Contact: Dr. Olegario Messier, MD Pager [phone]         After Hours (After 5p/  First Contact Pager: [phone]  weekends / holidays): Second Contact Pager: [phone]   SUBJECTIVE   Chief Complaint: weakness, worsening shortness of breath  History of Present Illness:  [name] is a 72 year old with PMH of iron deficiency anemia, afib and bioprosthetic mitral valve replacement on warfarin, hx of CVA, history of renal cell carcinoma 2013 in remission, diabetes, COPD, sleep apnea, chronic pain, PAD who presents with 3 weeks of worsening weakness and shortness of breath. She has trouble getting across room due to breathing. She denies melena, hematochezia, and hematemesis. She has noticed that she is coughing more in the last few weeks and bringing up mucus. She started using her nebulizer at home daily in additional to her triple therapy inhaler with some improvement in symptoms.  She was seen in Internal Medicine Clinic this morning by her PCP, Dr. [name]. Hgb noted to be at 6.4.   Meds:  Xanax 0.5 mg every day+ 1 mg at bedtime Hydrocodone 10 mg PRN up to TID Gabapentin 600 mg at bedtime Potassium 20 mEq Linzess 72  Tresiba 44 units every day Atrovent nasal spray Ozempic 1 mg weekly Duloxetine 60 mg BID Trelegy Rosuvastatin 20 mg Warfarin  Metoprolol succinate 25 mg Bupropion 150 mg Cetirizine 10 mg  Past Medical History  Past Surgical History:  Procedure Laterality Date   BIOPSY  07/19/2021   Procedure: BIOPSY;  Surgeon: Vida Rigger, MD;  Location: [facility];  Service: Gastroenterology;;   CARDIAC CATHETERIZATION     CARDIAC  VALVE REPLACEMENT  Aug. 2012   "mitral valve"   CAROTID ENDARTERECTOMY Right 07/04/2010   by Dr. Myra Gianotti for asymptomatic right carotid artery stenosis   CATARACT EXTRACTION W/ INTRAOCULAR LENS  IMPLANT, BILATERAL Bilateral    CHEST TUBE INSERTION  09/24/2010   Dr Zenaida Niece Trigt   COLONOSCOPY  05/12/2011   performed by Dr. Bosie Clos. Showing small internal hemorrhoids, single tubular adenoma polyp   COLONOSCOPY WITH PROPOFOL N/A 05/29/2020   Procedure: COLONOSCOPY WITH PROPOFOL;  Surgeon: Vida Rigger, MD;  Location: [facility];  Service: Endoscopy;  Laterality: N/A;   COLONOSCOPY WITH PROPOFOL N/A 07/19/2021   Procedure: COLONOSCOPY WITH PROPOFOL;  Surgeon: Vida Rigger, MD;  Location: [facility];  Service: Gastroenterology;  Laterality: N/A;   CRYOABLATION Left 09/2011   by Dr. Fredia Sorrow. Followed by Dr. Retta Diones  [facility]) .     DILATION AND CURETTAGE OF UTERUS     ESOPHAGOGASTRODUODENOSCOPY  05/12/2011   performed by Dr. Bosie Clos. Negative for ulcerations, biopsy negative for evidence of celiac sprue   ESOPHAGOGASTRODUODENOSCOPY N/A 06/29/2015   Procedure: ESOPHAGOGASTRODUODENOSCOPY (EGD);  Surgeon: Vida Rigger, MD;  Location: [facility] ENDOSCOPY;  Service: Endoscopy;  Laterality: N/A;   ESOPHAGOGASTRODUODENOSCOPY N/A 03/29/2016   Procedure: ESOPHAGOGASTRODUODENOSCOPY (EGD);  Surgeon: Vida Rigger, MD;  Location: [facility] ENDOSCOPY;  Service: Endoscopy;  Laterality: N/A;   ESOPHAGOGASTRODUODENOSCOPY (EGD) WITH PROPOFOL N/A 05/29/2020  Procedure: ESOPHAGOGASTRODUODENOSCOPY (EGD) WITH PROPOFOL;  Surgeon: Vida Rigger, MD;  Location: [facility];  Service: Endoscopy;  Laterality: N/A;   FRACTURE SURGERY     GIVENS CAPSULE STUDY N/A 06/30/2015   Procedure: GIVENS CAPSULE STUDY;  Surgeon: Vida Rigger, MD;  Location: [facility] ENDOSCOPY;  Service: Endoscopy;  Laterality: N/A;   GIVENS CAPSULE STUDY N/A 06/29/2015   Procedure: GIVENS CAPSULE STUDY;  Surgeon: Vida Rigger, MD;  Location: [facility] ENDOSCOPY;  Service: Endoscopy;   Laterality: N/A;   HEMORRHOID SURGERY  1970s?   "lanced"   HYSTEROSCOPY W/ ENDOMETRIAL ABLATION  06/2001   for persistent post-menopausal bleeding // by S. Kyra Manges, M.D.   IR GENERIC HISTORICAL  08/23/2015   IR RADIOLOGIST EVAL & MGMT 08/23/2015 Irish Lack, MD [facility] INTERV RAD   IR GENERIC HISTORICAL  04/09/2016   IR RADIOLOGIST EVAL & MGMT 04/09/2016 Irish Lack, MD [facility] INTERV RAD   IR RADIOLOGIST EVAL & MGMT  10/07/2016   IR RADIOLOGIST EVAL & MGMT  06/25/2017   IR RADIOLOGIST EVAL & MGMT  07/31/2020   IR RADIOLOGIST EVAL & MGMT  10/02/2020   IR RADIOLOGIST EVAL & MGMT  12/20/2020   IR RADIOLOGIST EVAL & MGMT  12/25/2021   LEFT HEART CATH AND CORONARY ANGIOGRAPHY N/A 04/21/2016   Procedure: Left Heart Cath and Coronary Angiography;  Surgeon: Runell Gess, MD;  Location: [facility] INVASIVE CV LAB;  Service: Cardiovascular;  Laterality: N/A;   LIPOMA EXCISION  08/2005   occipital lipoma 1.5cm - by Dr. Maryagnes Amos   LITHOTRIPSY  ~ 2000   MAZE Left 09/20/10   for paroxysmal atrial fibrillation (Dr. Donata Clay)   MITRAL VALVE REPLACEMENT  09/20/10    with a 27-mm pericardial porcine valve (Medtronic Mosaic valve, serial #[id number]). 09/20/10, Dr Donata Clay   ORIF CLAVICLE FRACTURE Right 01/2004   by Veverly Fells. Ophelia Charter, M.D for Right clavicle nonunion.; "it's got a pin in it"   POLYPECTOMY  05/29/2020   Procedure: POLYPECTOMY;  Surgeon: Vida Rigger, MD;  Location: [facility];  Service: Endoscopy;;   POLYPECTOMY  07/19/2021   Procedure: POLYPECTOMY;  Surgeon: Vida Rigger, MD;  Location: [facility];  Service: Gastroenterology;;   RADIOLOGY WITH ANESTHESIA Left 09/12/2020   Procedure: RADIOLOGY WITH ANESTHESIA- RENAL CRYOABLATION;  Surgeon: Irish Lack, MD;  Location: [facility] ORS;  Service: Radiology;  Laterality: Left;   REFRACTIVE SURGERY Bilateral    RIGHT HEART CATH N/A 04/23/2016   Procedure: Right Heart Cath;  Surgeon: Laurey Morale, MD;  Location: [facility] INVASIVE CV LAB;  Service: Cardiovascular;   Laterality: N/A;   TONSILLECTOMY     TUBAL LIGATION      Social:  Lives With: alone. Her sister helps with getting her groceries. She has trouble completing iADLs including cooking but is managing. PCP: [facility] Dr [name] Substances: smokes about 1 PPD, no alcohol use or other substance use  Allergies: Allergies as of 09/25/2022 - Reviewed 09/25/2022  Allergen Reaction Noted   Dexilant [dexlansoprazole] Other (See Comments) 11/08/2019   Lorazepam Other (See Comments) 11/04/2010   Oxycontin [oxycodone] Other (See Comments) 05/03/2012   Tramadol hcl Swelling     Review of Systems: A complete ROS was negative except as per HPI.   OBJECTIVE:   Physical Exam: Blood pressure (!) 165/43, pulse 66, temperature 97.8 F (36.6 C), temperature source Oral, resp. rate 18.  Constitutional: in no acute distress, chronically ill appearing, pale HENT: normocephalic atraumatic, mucous membranes moist Eyes: conjunctiva non-erythematous Cardiovascular: regular rate and rhythm, no m/r/g Pulmonary/Chest: normal  work of breathing on room air, scattered rhonchi and wheezing appreciated throughout lungs bilaterally Abdominal: soft, non-tender, non-distended, bowel sounds in all 4 quadrants.  Neurological: alert & oriented x 3 GU: non bleeding external and internal hemorrhoids noted, FOBT completed RN Aime in room during exam  Labs: CBC    Component Value Date/Time   WBC 8.4 09/25/2022 0950   RBC 3.09 (L) 09/25/2022 0950   HGB 6.4 (LL) 09/25/2022 0950   HGB 8.3 (L) 08/21/2022 1204   HGB 12.4 01/26/2012 0851   HCT 23.9 (L) 09/25/2022 0950   HCT 28.8 (L) 08/21/2022 1204   HCT 36.5 01/26/2012 0851   PLT 239 09/25/2022 0950   PLT 240 08/21/2022 1204   MCV 77.3 (L) 09/25/2022 0950   MCV 75 (L) 08/21/2022 1204   MCV 94.5 01/26/2012 0851   MCH 20.7 (L) 09/25/2022 0950   MCHC 26.8 (L) 09/25/2022 0950   RDW 18.3 (H) 09/25/2022 0950   RDW 16.7 (H) 08/21/2022 1204   RDW 16.0 (H) 01/26/2012 0851    LYMPHSABS 1.2 01/19/2022 0331   LYMPHSABS 2.1 09/01/2019 1022   LYMPHSABS 1.8 01/26/2012 0851   MONOABS 0.9 01/19/2022 0331   MONOABS 0.9 01/26/2012 0851   EOSABS 0.0 01/19/2022 0331   EOSABS 0.1 09/01/2019 1022   BASOSABS 0.0 01/19/2022 0331   BASOSABS 0.1 09/01/2019 1022   BASOSABS 0.0 01/26/2012 0851     CMP     Component Value Date/Time   NA 138 09/25/2022 0950   NA 135 08/21/2022 1204   K 3.9 09/25/2022 0950   CL 105 09/25/2022 0950   CO2 24 09/25/2022 0950   GLUCOSE 187 (H) 09/25/2022 0950   BUN 15 09/25/2022 0950   BUN 17 08/21/2022 1204   CREATININE 1.13 (H) 09/25/2022 0950   CREATININE 0.91 08/21/2014 1027   CALCIUM 8.8 (L) 09/25/2022 0950   PROT 6.3 (L) 09/25/2022 0950   PROT 5.8 (L) 01/30/2022 1155   ALBUMIN 3.3 (L) 09/25/2022 0950   ALBUMIN 3.7 (L) 01/30/2022 1155   AST 9 (L) 09/25/2022 0950   ALT 10 09/25/2022 0950   ALKPHOS 54 09/25/2022 0950   BILITOT 0.4 09/25/2022 0950   BILITOT 0.3 01/30/2022 1155   GFRNONAA 51 (L) 09/25/2022 0950   GFRNONAA 66 08/21/2014 1027   GFRAA 58 (L) 09/26/2019 1317   GFRAA 77 08/21/2014 1027    ASSESSMENT & PLAN:   Assessment & Plan by Problem: Active Problems:   Symptomatic anemia   [name] is a 72 y.o. person living with a history of iron deficiency anemia,  afib and mitral valve replacement on warfarin, history of renal cell carcinoma 2013 in remission, diabetes, COPD, sleep apnea who presented with weakness and shortness of breath and admitted for symptomatic anemia on hospital day 0  Symptomatic IDA Patient with long standing history of IDA presenting with drop in hemoglobin to 6.4. Last CBC was about 4 weeks ago and hgb at 8 then. Her baseline appears to be around 10. Iron studies in July showed Iron stores were very low. She has not received iron infusion that was ordered yet as an outpatient.  She denies dark stools or red blood in stool and seems unlikely that she would be constipated for a week if  she was having a lower GI bleed. Previously had EGD(4/22), colonoscopy (6/23) that were unrevealing. Capsule endoscopy completed in 2017 which showed small AVM but non-bleeding. She follows with [facility] outpatient, unfortunately unable to see last time she saw them.  FOBT was negative. Unclear why she has such severe iron deficiency anemia. She does not take an PPI to decrease absorption and I think it is unlikely that she has celiac disease that developed this late in life. For now, will give her blood products and iron with plans for her to follow-up with GI as outpatient. Iron deficit of 1538 mg. She is getting 250 mg from blood.   Of note, she did have fever of 100.6 during transfusion in 03/2016. I called and spoke with blood bank, all PRBCs are leukoreduced. If she were to have another fever with this transfusion would give acetaminophen and stop transfusion.  -1 unit PRBCs -IV iron 500 mg x1 -post H&H -trend CBC -hold warfarin -PT/OT  COPD exacerbation She has been adherent with trelegy but needed to use nebulizers daily for the last 3 weeks due to wheezing and shortness of breath. She continues to smoke 1 PPD. Last PFT showed severe obstructive lung disease. -prednisone 40 mg x5 days -azithromycin 500 mg for 3 days -scheduled nebs -nicotine patch  Atrial fibrillation History of mitral valve replacement 2/2 mitral stenosis INR goal of 2.0-2.5 per pharmacy notes with warfarin clinic at [facility]. INR of 1.8 today. For now will hold warfarin. In the past she has not been interested in switching to DOAC. Holding metoprolol today, would consider restarting tomorrow. -likely resume warfarin tomorrow if hgb stable following transfusion -may need to discharge with lovenox bridge and close follow-up with warfarin clinic -INR in AM -hold metoprolol  Chronic constipation History of constipation. She takes linzess for this with some improvement. Her last bowel movement was 1 week ago. She denies  abdominal pain today but did have some nausea this morning. -linzess -senna -miralax  Diabetes Plan to restart home tresiba tomorrow. She is unsure if she remembered to take this in the morning. A1c well controlled at 7.4 today.  -restart tresiba tomorrow 44 units, if hyperglycemic then can give long acting insulin at that time -ssi  Nocturnal hypoxia OSA Pulmonary hypertension -CPAP at bedtime with bleed-in O2   HFpEF Last EF at 60-65% on echo in 2019. She appears euvolemic. -continue furosemide 80 mg and K supplement -K>4 and Mg>2 -daily weights  Diet: Carb-Modified Code: Full  Prior to Admission Living Arrangement: Home, living alone Anticipated Discharge Location: Home Barriers to Discharge: blood transfusion  Dispo: Admit patient to Observation with expected length of stay less than 2 midnights.  Signed: Rudene Christians, DO Internal Medicine Resident PGY-3  09/25/2022, 12:55 PM

## 2022-09-25 NOTE — Assessment & Plan Note (Signed)
Has finally received a concentrator from a new company which is working, but which is too heavy for her to carry.  Even the smaller sizes are becoming too much to manage.  Using 02 2L at night and when napping.  Check VBG today for hypercarbia ([name] is often sleepy, though today is very awake and alert with nml mentation).

## 2022-09-25 NOTE — Assessment & Plan Note (Signed)
Bowel regularity and consistency much improved with low dose Linzess.  She is pleased and this will be continued.

## 2022-09-25 NOTE — Progress Notes (Signed)
Expedited visit to address worsening symptoms.  Feels worse in past three weeks than she can ever recall. Generalized pain, worsened dyspnea, unable to walk due to weakness and pain and difficulty breathing, restarted gabapentin which helped with burning on plantar surfaces but not leg pain. Hand and arm pain and weakness resulting in inability to effectively use her MDI - using neb instead, which is somewhat helpful.   No energy.   New 02 company, 4th machine, using 02 anytime asleep. Unable to take the concentrator out of the house due to heaviness (even the small tanks are heavy).  She did have a better night of sleep once wearing it through the night.   Very pale and cold and feels very anemic, but no melena or BRBPR.  Bowel habits irregular - she may go a few days without a movement, but the resulting bm is normal.  No difficulty passing stool.  Linzess has helped.  Takes IB Guard which has helped with gas.  She has been pleased with it.    No weight loss but feels like she's not eating much.  Snacking.   Inconsistent quantity and frequency.  Restarted mobile meals.  Frequently just one meal a day.  Meriam Sprague is still shopping for some groceries.  BP (!) 165/44 (BP Location: Right Arm, Patient Position: Sitting, Cuff Size: Small)   Pulse 69   Temp (!) 97.4 F (36.3 C) (Oral)   Ht 5\' 2"  (1.575 m)   Wt 160 lb 14.4 oz (73 kg)   SpO2 94%   BMI 29.43 kg/m  Orthostatics:  supine 162/38 (74), sitting 134/32 (72), standing 142/30 (67) R arm small cuff  Ms. Policastro is very pale and appears exhausted and unwell.  Awake and alert with clear mentation and clear speech.  Neck veins flat.  Breathing nonlabored but with paragraph speech becomes mildly dypsneic.  Scattered rales and rhonchi throughtout lungs fields front and back, not only in the bases.  Heart irreg irreg, soft syst m best LLSB.  Abd soft, positive BS, uncomfortable with palpation due to needing to have BMFeet are warm (examined in chair,  not supine).  Toes are dusky with delayed cap refill.  No palpable pulses (all chronic).  No edema or wounds.  Skin is dry and flaky.  Sensation intact to monofilament testing.  L medial great nail fold has a medial partial split; no longer tender on the medial nailfold.   Skin turgor normal, has improved since reduced lasix (and K) at last visit.  Eye appt scheduled for 10/2022 [facility] M[facility]ogram this pm will be rescheduled  Assessment and plan:  [name] will be direct admitted for blood transfusion.    Chronic iron deficiency anemia Iron studies last month again show depletion of stores resulting in severe deficiency, associated in 2g drop in Hgb.  Outpatient Fe infusion ordered but not yet completed (she indicates she hadn't received a call to schedule).  Today's stat labs confirm worsened anemia with Hg 6.4.  No observed bleeding or melena.  This is a recurrence of a long term problem.  Plan admission for observation and transfusion of pRBCs.  Fe infusion also indicated though new hospital guidelines may not permit.  Severe anemia most certainly is greatest contributor to her feeling so poorly.  She typically responds well to transfusion.   Pain of great toes medial nail beds of both feet Evaluated by a clinical podiatrist.  Medial great toe nail trimmed a bit.  Pain has improved.  Monitor the  medial nail split which could catch on her shoe and detach causing a poorly healing wound.  She is aware.   Chronic constipation Bowel regularity and consistency much improved with low dose Linzess.  She is pleased and this will be continued.    COPD mixed type (HCC) Lung exam is unchanged from last month (at which time CXR was clear).  Continues trelegy and rescue albuterol.  New albuterol MDI sent by her pharmacy is a larger and more difficult inhaler to use; given her hand weakness and pain, she cannot use it.  Has been supplementing with alb neb ave once daily.  Cough is at baseline.  Dyspnea at  this time is attributed largely to her severe anemia.  Referral to new pulmonologist is scheduled for next month.  No exacerbation at this time.    Nocturnal hypoxia per sleep study 07/2019 Has finally received a concentrator from a new company which is working, but which is too heavy for her to carry.  Even the smaller sizes are becoming too much to manage.  Using 02 2L at night and when napping.  Check VBG today for hypercarbia (she is often sleepy, though today is very awake and alert with nml mentation).   Type 2 diabetes mellitus with complications (HCC) Blood sugar data not brought today.  Her CGM sensor has been falling off.  A1c 7.4 in 07/2022, due next month.  NO change in therapy.

## 2022-09-26 ENCOUNTER — Other Ambulatory Visit (HOSPITAL_COMMUNITY): Payer: Self-pay

## 2022-09-26 ENCOUNTER — Telehealth: Payer: Self-pay

## 2022-09-26 ENCOUNTER — Ambulatory Visit: Payer: Self-pay | Admitting: *Deleted

## 2022-09-26 DIAGNOSIS — E1169 Type 2 diabetes mellitus with other specified complication: Secondary | ICD-10-CM | POA: Diagnosis not present

## 2022-09-26 DIAGNOSIS — F17210 Nicotine dependence, cigarettes, uncomplicated: Secondary | ICD-10-CM | POA: Diagnosis not present

## 2022-09-26 DIAGNOSIS — J441 Chronic obstructive pulmonary disease with (acute) exacerbation: Secondary | ICD-10-CM | POA: Diagnosis not present

## 2022-09-26 DIAGNOSIS — I4811 Longstanding persistent atrial fibrillation: Secondary | ICD-10-CM

## 2022-09-26 DIAGNOSIS — D62 Acute posthemorrhagic anemia: Secondary | ICD-10-CM | POA: Diagnosis not present

## 2022-09-26 DIAGNOSIS — D649 Anemia, unspecified: Secondary | ICD-10-CM | POA: Diagnosis not present

## 2022-09-26 DIAGNOSIS — K5900 Constipation, unspecified: Secondary | ICD-10-CM | POA: Diagnosis not present

## 2022-09-26 DIAGNOSIS — Z794 Long term (current) use of insulin: Secondary | ICD-10-CM | POA: Diagnosis not present

## 2022-09-26 DIAGNOSIS — I10 Essential (primary) hypertension: Secondary | ICD-10-CM | POA: Diagnosis not present

## 2022-09-26 LAB — BASIC METABOLIC PANEL
Anion gap: 8 (ref 5–15)
BUN: 14 mg/dL (ref 8–23)
CO2: 23 mmol/L (ref 22–32)
Calcium: 8.9 mg/dL (ref 8.9–10.3)
Chloride: 103 mmol/L (ref 98–111)
Creatinine, Ser: 1.06 mg/dL — ABNORMAL HIGH (ref 0.44–1.00)
GFR, Estimated: 55 mL/min — ABNORMAL LOW (ref >60)
Glucose, Bld: 197 mg/dL — ABNORMAL HIGH (ref 70–99)
Potassium: 4.4 mmol/L (ref 3.5–5.1)
Sodium: 134 mmol/L — ABNORMAL LOW (ref 135–145)

## 2022-09-26 LAB — GLUCOSE, CAPILLARY
Glucose-Capillary: 193 mg/dL — ABNORMAL HIGH (ref 70–99)
Glucose-Capillary: 207 mg/dL — ABNORMAL HIGH (ref 70–99)
Glucose-Capillary: 239 mg/dL — ABNORMAL HIGH (ref 70–99)
Glucose-Capillary: 350 mg/dL — ABNORMAL HIGH (ref 70–99)
Glucose-Capillary: 254 mg/dL — ABNORMAL HIGH (ref 70–99)

## 2022-09-26 LAB — CBC
HCT: 25.7 % — ABNORMAL LOW (ref 36.0–46.0)
Hemoglobin: 7.6 g/dL — ABNORMAL LOW (ref 12.0–15.0)
MCH: 22.7 pg — ABNORMAL LOW (ref 26.0–34.0)
MCHC: 29.6 g/dL — ABNORMAL LOW (ref 30.0–36.0)
MCV: 76.7 fL — ABNORMAL LOW (ref 80.0–100.0)
Platelets: 214 10*3/uL (ref 150–400)
RBC: 3.35 MIL/uL — ABNORMAL LOW (ref 3.87–5.11)
RDW: 18 % — ABNORMAL HIGH (ref 11.5–15.5)
WBC: 7.6 10*3/uL (ref 4.0–10.5)
nRBC: 0.7 % — ABNORMAL HIGH (ref 0.0–0.2)

## 2022-09-26 LAB — PROTIME-INR
INR: 2.1 — ABNORMAL HIGH (ref 0.8–1.2)
Prothrombin Time: 23.3 s — ABNORMAL HIGH (ref 11.4–15.2)

## 2022-09-26 LAB — HEMOGLOBIN AND HEMATOCRIT, BLOOD
HCT: 30.2 % — ABNORMAL LOW (ref 36.0–46.0)
Hemoglobin: 8.8 g/dL — ABNORMAL LOW (ref 12.0–15.0)

## 2022-09-26 LAB — PREPARE RBC (CROSSMATCH)

## 2022-09-26 MED ORDER — METOPROLOL SUCCINATE ER 25 MG PO TB24
25.0000 mg | ORAL_TABLET | Freq: Every day | ORAL | Status: DC
  Administered 2022-09-26 – 2022-09-29 (×4): 25 mg via ORAL
  Filled 2022-09-26 (×4): qty 1

## 2022-09-26 MED ORDER — SODIUM CHLORIDE 0.9% IV SOLUTION: Freq: Once | INTRAVENOUS | Status: AC

## 2022-09-26 MED ORDER — MUSCLE RUB 10-15 % EX CREA
1.0000 | TOPICAL_CREAM | CUTANEOUS | Status: DC | PRN
  Administered 2022-09-26: 1 via TOPICAL
  Filled 2022-09-26: qty 85

## 2022-09-26 MED ORDER — WARFARIN SODIUM 2.5 MG PO TABS
2.5000 mg | ORAL_TABLET | Freq: Every day | ORAL | Status: AC
  Administered 2022-09-26: 2.5 mg via ORAL
  Filled 2022-09-26: qty 1

## 2022-09-26 MED ORDER — DOCUSATE SODIUM 50 MG PO CAPS
50.0000 mg | ORAL_CAPSULE | Freq: Every day | ORAL | Status: DC
  Administered 2022-09-26 – 2022-09-29 (×4): 50 mg via ORAL
  Filled 2022-09-26 (×4): qty 1

## 2022-09-26 MED ORDER — ONDANSETRON 4 MG PO TBDP
4.0000 mg | ORAL_TABLET | Freq: Once | ORAL | Status: AC
  Administered 2022-09-26: 4 mg via ORAL
  Filled 2022-09-26: qty 1

## 2022-09-26 MED ORDER — NICOTINE 14 MG/24HR TD PT24
14.0000 mg | MEDICATED_PATCH | Freq: Every day | TRANSDERMAL | Status: DC
  Administered 2022-09-26 – 2022-09-29 (×4): 14 mg via TRANSDERMAL
  Filled 2022-09-26 (×4): qty 1

## 2022-09-26 MED ORDER — ALPRAZOLAM 0.5 MG PO TABS
0.5000 mg | ORAL_TABLET | Freq: Once | ORAL | Status: AC
  Administered 2022-09-26: 0.5 mg via ORAL
  Filled 2022-09-26: qty 1

## 2022-09-26 MED ORDER — INSULIN GLARGINE-YFGN 100 UNIT/ML ~~LOC~~ SOLN
44.0000 [IU] | Freq: Every day | SUBCUTANEOUS | Status: DC
  Administered 2022-09-26 – 2022-09-29 (×4): 44 [IU] via SUBCUTANEOUS
  Filled 2022-09-26 (×4): qty 0.44

## 2022-09-26 MED ORDER — WARFARIN - PHYSICIAN DOSING INPATIENT: Freq: Every day | Status: DC

## 2022-09-26 NOTE — Inpatient Diabetes Management (Signed)
Inpatient Diabetes Program Recommendations  AACE/ADA: New Consensus Statement on Inpatient Glycemic Control (2015)  Target Ranges:  Prepandial:   less than 140 mg/dL      Peak postprandial:   less than 180 mg/dL (1-2 hours)      Critically ill patients:  140 - 180 mg/dL   Lab Results  Component Value Date   GLUCAP 207 (H) 09/26/2022   HGBA1C 7.4 (A) 07/24/2022    Review of Glycemic Control  Latest Reference Range & Units 09/25/22 12:33 09/25/22 17:11 09/25/22 20:48 09/26/22 08:16 09/26/22 08:18  Glucose-Capillary 70 - 99 mg/dL 161 (H) 096 (H) 045 (H) 193 (H) 207 (H)   Diabetes history: DM  Outpatient Diabetes medications:  Tresiba 44 units daily Ozempic 1 mg daily (takes on Mondays) Current orders for Inpatient glycemic control:  Novolog 0-15 units tid with meals Tresiba  44 units daily Inpatient Diabetes Program Recommendations:   Note that [place] ordered (Consider d/c of Tresiba and use Semglee as hospital substitution).   Thanks,  Beryl Meager, RN, BC-ADM Inpatient Diabetes Coordinator Pager [phone]  (8a-5p)

## 2022-09-26 NOTE — Progress Notes (Signed)
HD#0 Subjective:  Overnight Events: No acute event overnight.  She received 1 unit of packed red blood cell, post H/H stable at 7.6.  Objective:  Vital signs in last 24 hours: Vitals:   09/25/22 2348 09/26/22 0050 09/26/22 0409 09/26/22 0741  BP:  (!) 157/65 (!) 169/71 (!) 153/118  Pulse: 86 88 92 84  Resp: 15 16 16 16   Temp:  98.2 F (36.8 C) 98.1 F (36.7 C) (!) 97.5 F (36.4 C)  TempSrc:  Oral Oral Oral  SpO2: 94% 94% 94% 95%  Weight:      Height:       Supplemental O2: Room Air SpO2: 95 % FiO2 (%): 28 %   Physical Exam:   General: Well appearing, Not in acute distress. Pulmonary: Lungs clear bilaterally.  Mild wheezes.  Abdominal: Soft, nontender, nondistended. Normal bowel sounds. Extremities: Palpable dorsalis pedis and radialis pulses. Normal ROM.  Filed Weights   09/25/22 1306  Weight: 77.2 kg    Intake/Output Summary (Last 24 hours) at 09/26/2022 1025 Last data filed at 09/26/2022 0153 Gross per 24 hour  Intake 1275 ml  Output 1 ml  Net 1274 ml   Net IO Since Admission: 1,274 mL [09/26/22 1025]  Recent Labs    09/25/22 2048 09/26/22 0816 09/26/22 0818  GLUCAP 243* 193* 207*     Pertinent Labs:    Latest Ref Rng & Units 09/26/2022    3:02 AM 09/25/2022    8:08 PM 09/25/2022    9:50 AM  CBC  WBC 4.0 - 10.5 K/uL 7.6   8.4   Hemoglobin 12.0 - 15.0 g/dL 7.6  7.8  6.4   Hematocrit 36.0 - 46.0 % 25.7  25.9  23.9   Platelets 150 - 400 K/uL 214   239        Latest Ref Rng & Units 09/26/2022    3:02 AM 09/25/2022    9:50 AM 08/21/2022   12:04 PM  CMP  Glucose 70 - 99 mg/dL 528  413  244   BUN 8 - 23 mg/dL 14  15  17    Creatinine 0.44 - 1.00 mg/dL 0.10  2.72  5.36   Sodium 135 - 145 mmol/L 134  138  135   Potassium 3.5 - 5.1 mmol/L 4.4  3.9  5.9   Chloride 98 - 111 mmol/L 103  105  100   CO2 22 - 32 mmol/L 23  24  23    Calcium 8.9 - 10.3 mg/dL 8.9  8.8  9.2   Total Protein 6.5 - 8.1 g/dL  6.3    Total Bilirubin 0.3 - 1.2 mg/dL  0.4     Alkaline Phos 38 - 126 U/L  54    AST 15 - 41 U/L  9    ALT 0 - 44 U/L  10      Imaging: No results found.  Assessment/Plan:   Active Problems:   Hypertension   Type 2 diabetes mellitus with other specified complication (HCC)   Constipation   Tobacco use disorder   Acute on chronic anemia   COPD exacerbation (HCC)   Acute on chronic blood loss anemia   Longstanding persistent atrial fibrillation [facility])   Patient Summary: [name] is a 72 year old with PMH of iron deficiency anemia, afib and bioprosthetic mitral valve replacement on warfarin, hx of CVA, history of renal cell carcinoma 2013 in remission, diabetes, COPD, sleep apnea, chronic pain, PAD who presents with 3 weeks of worsening weakness  and shortness of breath and admitted for symptomatic anemia.   Symptomatic IDA  On presentation Hb 6.4.  She is S/p 2 pRBC's transfusion. Unclear the the etiology of patient's recurrent symptomatic anemia. FOBT negative. She had colonoscopy in 2022 that was normal.  EGD was also normal. Capsule endoscopy completed in 2017 which showed small AVM but non-bleeding. Patient was seen by PT, and is recommending SNF. -Follow-up posttransfusion H/H.   -Trend CBC - PT recommending SNF.  COPD exacerbation  She has mild cough, otherwise she is afebrile and satting on room air.  On lung exam, patient has mild wheezing. -Prednisone 40 mg until 08/20 -Azithromycin 500 mg for until 08/18. -nicotine patch.  3. Atrial fibrillation History of mitral valve replacement 2/2 mitral stenosis INR at goal, 2.1.  We restarted patient's home warfarin, given the Hb is stable pos transfusion.  -Continue metoprolol. -Continue warfarin.  4. Chronic constipation  No bowel movement for a week. -Continue linzess. -MiraLAX twice daily -Senna  5. Diabetes Blood glucose elevated 190-230. -Restarted 44 units Semglee. -SSI  Diet: Regular diet IVF: PO intake VTE: SCDs Start: 09/25/22 1243warfarin  (COUMADIN) tablet 2.5 mg  Code: Full PT/OT: SNF for Subacute PT ID:  Anti-infectives (From admission, onward)    Start     Dose/Rate Route Frequency Ordered Stop   09/25/22 1400  azithromycin (ZITHROMAX) tablet 500 mg        500 mg Oral Daily 09/25/22 1307 09/28/22 0959        Anticipated discharge to Skilled nursing facility    .  Laretta Bolster, M.D.  Internal Medicine Resident, PGY-1 [facility]  Pager: [phone] 10:25 AM, 09/26/2022   **Please contact the on call pager after 5 pm and on weekends at [phone].**  Please contact the on call pager after 5 pm and on weekends at [phone].

## 2022-09-26 NOTE — Evaluation (Signed)
Physical Therapy Evaluation Patient Details Name: [name] MRN: [id number] DOB: [birth year] Today's Date: 09/26/2022  History of Present Illness  Pt is a 72 year old female admitted on 09/25/22 with weakness and SOB over the last 3 weeks. PMH of iron deficiency anemia, afib and bioprosthetic mitral valve replacement on warfarin, hx of CVA, history of renal cell carcinoma 2013 in remission, diabetes, COPD, sleep apnea, chronic pain, PAD.  Clinical Impression  Pt presents with admitting diagnosis above. Pt today was able to ambulate short distance in hallway however had 4 losses of balance requiring Min A to correct. Pt also required a chair follow for safety back to the room. Recommend SNF upon DC given that pt lives alone, has had multiple falls over the last 6 months, and no immediate support available.       If plan is discharge home, recommend the following: A lot of help with walking and/or transfers;A little help with bathing/dressing/bathroom;Assistance with cooking/housework;Assist for transportation;Help with stairs or ramp for entrance   Can travel by private vehicle   No    Equipment Recommendations Other (comment) (Per accepting facility)  Recommendations for Other Services       Functional Status Assessment Patient has had a recent decline in their functional status and demonstrates the ability to make significant improvements in function in a reasonable and predictable amount of time.     Precautions / Restrictions Precautions Precautions: Fall Restrictions Weight Bearing Restrictions: No      Mobility  Bed Mobility Overal bed mobility: Needs Assistance Bed Mobility: Supine to Sit     Supine to sit: Supervision     General bed mobility comments: Pt requested to be left sitting EOB.    Transfers Overall transfer level: Needs assistance Equipment used: Rolling walker (2 wheels) Transfers: Sit to/from Stand Sit to Stand: Contact guard assist            General transfer comment: 1 LOB upon initial sit to stand. Cues for hand placement and safety.    Ambulation/Gait Ambulation/Gait assistance: Contact guard assist, Min assist Gait Distance (Feet): 75 Feet (30+35) Assistive device: Rolling walker (2 wheels) Gait Pattern/deviations: Trunk flexed, Drifts right/left, Narrow base of support, Decreased stride length, Step-through pattern Gait velocity: Decreased     General Gait Details: 3 LOB noted requiring Min A to correct. Pt required 1 seated rest break and chair back to room for safety. Cues for proximity and safety with RW.  Stairs            Wheelchair Mobility     Tilt Bed    Modified Rankin (Stroke Patients Only)       Balance Overall balance assessment: Needs assistance Sitting-balance support: Bilateral upper extremity supported Sitting balance-Leahy Scale: Good     Standing balance support: Bilateral upper extremity supported, During functional activity, Reliant on assistive device for balance Standing balance-Leahy Scale: Poor Standing balance comment: Reliant on RW                             Pertinent Vitals/Pain Pain Assessment Pain Assessment: 0-10 Pain Score: 8  Pain Location: everywhere Pain Descriptors / Indicators: Discomfort, Grimacing, Constant Pain Intervention(s): Monitored during session, Limited activity within patient's tolerance, Repositioned    Home Living Family/patient expects to be discharged to:: Private residence Living Arrangements: Alone Available Help at Discharge: Other (Comment) (Pt reports no available help close by) Type of Home: House Home Access: Stairs to enter  Entrance Stairs-Rails: None Entrance Stairs-Number of Steps: 1   Home Layout: One level Home Equipment: BSC/3in1;Rolling Walker (2 wheels);Cane - single point;Shower seat - built in;Grab bars - tub/shower;Grab bars - toilet;Hand held shower head      Prior Function Prior Level of  Function : Independent/Modified Independent;History of Falls (last six months) (Pt reports 5-6 falls over the last 6 months.)             Mobility Comments: Pt reports mostly furniture walking ADLs Comments: Pt reports Ind     Extremity/Trunk Assessment   Upper Extremity Assessment Upper Extremity Assessment: Generalized weakness    Lower Extremity Assessment Lower Extremity Assessment: Generalized weakness    Cervical / Trunk Assessment Cervical / Trunk Assessment: Kyphotic  Communication   Communication Communication: No apparent difficulties Cueing Techniques: Verbal cues;Tactile cues  Cognition Arousal: Alert Behavior During Therapy: WFL for tasks assessed/performed, Impulsive Overall Cognitive Status: Within Functional Limits for tasks assessed                                 General Comments: Pt very pleasant and has good sense of humor. Slightly impulsive standing multiple times without command.        General Comments General comments (skin integrity, edema, etc.): VSS. O2 sats remained above 94% on RA however pt reports wearing 2.5L at home.    Exercises     Assessment/Plan    PT Assessment Patient needs continued PT services  PT Problem List Decreased strength;Decreased range of motion;Decreased activity tolerance;Decreased balance;Decreased mobility;Decreased coordination;Decreased cognition;Decreased knowledge of use of DME;Decreased safety awareness;Decreased knowledge of precautions;Cardiopulmonary status limiting activity;Pain       PT Treatment Interventions DME instruction;Gait training;Stair training;Functional mobility training;Therapeutic activities;Therapeutic exercise;Balance training;Neuromuscular re-education;Cognitive remediation;Patient/family education    PT Goals (Current goals can be found in the Care Plan section)  Acute Rehab PT Goals Patient Stated Goal: to get better PT Goal Formulation: With patient Time For Goal  Achievement: 10/10/22 Potential to Achieve Goals: Fair    Frequency Min 1X/week     Co-evaluation               AM-PAC PT "6 Clicks" Mobility  Outcome Measure Help needed turning from your back to your side while in a flat bed without using bedrails?: A Little Help needed moving from lying on your back to sitting on the side of a flat bed without using bedrails?: A Little Help needed moving to and from a bed to a chair (including a wheelchair)?: A Little Help needed standing up from a chair using your arms (e.g., wheelchair or bedside chair)?: A Little Help needed to walk in hospital room?: A Lot Help needed climbing 3-5 steps with a railing? : Total 6 Click Score: 15    End of Session Equipment Utilized During Treatment: Gait belt Activity Tolerance: Patient tolerated treatment well Patient left: in bed;with call bell/phone within reach Nurse Communication: Mobility status PT Visit Diagnosis: Other abnormalities of gait and mobility (R26.89)    Time: 1610-9604 PT Time Calculation (min) (ACUTE ONLY): 32 min   Charges:   PT Evaluation $PT Eval Moderate Complexity: 1 Mod PT Treatments $Gait Training: 8-22 mins PT General Charges $$ ACUTE PT VISIT: 1 Visit         Shela Nevin, PT, DPT Acute Rehab Services [phone]   [name] 09/26/2022, 10:49 AM

## 2022-09-26 NOTE — Hospital Course (Signed)
Symptomatic IDA On presentation, hemoglobin 6.4. We held warfarin due to the acute Hb drop. She received 500 mg of IV iron, 2 units of packed red blood cells.  FOBT negative.  At the time of discharge, hemoglobin stable at 9.2. -Follow up with PCP.   COPD exacerbation She has been having 3 weeks of wheezing, shortness of breath and mild productive cough.  She was prescribed a 5-day course for prednisone and azithromycin, for COPD exacerbation.  Atrial fibrillation History of mitral valve replacement 2/2 mitral stenosis Warfarin was held on presentation. INR was subtherapeutic(1.8). She  has received 4 doses of warfarin, however, INR is still subtherapeutic. Patient received an additional 5 mg of warfarin, and asked to follow-up with our outpatient pharmacist next week. -Continue warfarin 2.5mg  -Continue metoprolol 25mg  XL    Chronic constipation Stable.  We continued senna and MiraLAX as needed.  Diabetes Stable.SSI   Nocturnal hypoxia OSA Pulmonary hypertension -CPAP at bedtime with bleed-in O2    HFpEF Stable.  We continued furosemide 80 mg and potassium repletion.

## 2022-09-26 NOTE — TOC Initial Note (Addendum)
Transition of Care [facility]) - Initial/Assessment Note    Patient Details  Name: [name] MRN: [id number] Date of Birth: [birth year]  [facility] CM/SW Contact:    Carley Hammed, LCSW Phone Number: 09/26/2022, 12:11 PM  Clinical Narrative:                 CSW met with pt at bedside while therapy was present. Pt was out of breath and noted she was in pain. CSW and PT discussed dispo options with pt, who noted at first she wants to go home. CSW and PT discussed her current limitations and safety risk, and she is agreeable to pursuing rehab. Pt states she has been to [place] 4 times and would like to go back there. CSW asked about additional supports, pt lives alone, she has an ex- husband who is in [place], a son in [place], and a dtr in [place]. She states that these are not strong supports.  Insurance coverage and next steps discussed, pt notes no further questions. CSW to complete paperwork and request [facility] to review for admission. TOC will continue to follow.   2:00 [facility] is able to offer a bed, Authorization pending with [facility] for SNF and ambulance transport. [facility] will have a bed Monday. Medical team notified. TOC will continue to follow.   3:30 CSW recieived a call from [facility] and was advised that since pt still needed blood the auth would likely be denied. CSW was advised to start auth when closer to DC. TOC will continue to follow for DC needs.   Expected Discharge Plan: Skilled Nursing Facility Barriers to Discharge: Continued Medical Work up, SNF Pending bed offer, Insurance Authorization   Patient Goals and CMS Choice Patient states their goals for this hospitalization and ongoing recovery are:: Pt states she wants to be able to go home. CMS Medicare.gov Compare Post Acute Care list provided to:: Patient Choice offered to / list presented to : Patient      Expected Discharge Plan and Services     Post Acute Care Choice: Skilled Nursing Facility Living  arrangements for the past 2 months: Single Family Home                                      Prior Living Arrangements/Services Living arrangements for the past 2 months: Single Family Home Lives with:: Self Patient language and need for interpreter reviewed:: Yes Do you feel safe going back to the place where you live?: Yes      Need for Family Participation in Patient Care: No (Comment) Care giver support system in place?: No (comment)   Criminal Activity/Legal Involvement Pertinent to Current Situation/Hospitalization: No - Comment as needed  Activities of Daily Living Home Assistive Devices/Equipment: None ADL Screening (condition at time of admission) Patient's cognitive ability adequate to safely complete daily activities?: Yes Is the patient deaf or have difficulty hearing?: No Does the patient have difficulty seeing, even when wearing glasses/contacts?: No Does the patient have difficulty concentrating, remembering, or making decisions?: No Patient able to express need for assistance with ADLs?: Yes Does the patient have difficulty dressing or bathing?: Yes Independently performs ADLs?: No Communication: Independent Dressing (OT): Needs assistance Is this a change from baseline?: Change from baseline, expected to last >3 days Grooming: Needs assistance Is this a change from baseline?: Change from baseline, expected to last >3 days Feeding: Independent Bathing: Needs  assistance Is this a change from baseline?: Change from baseline, expected to last >3 days Toileting: Needs assistance Is this a change from baseline?: Change from baseline, expected to last >3days In/Out Bed: Needs assistance Is this a change from baseline?: Change from baseline, expected to last >3 days Walks in Home: Needs assistance Is this a change from baseline?: Change from baseline, expected to last >3 days Does the patient have difficulty walking or climbing stairs?: Yes Weakness of Legs:  Both Weakness of Arms/Hands: Both  Permission Sought/Granted Permission sought to share information with : Family Supports Permission granted to share information with : No              Emotional Assessment Appearance:: Appears stated age Attitude/Demeanor/Rapport: Engaged Affect (typically observed): Irritable Orientation: : Oriented to Self, Oriented to Place, Oriented to  Time, Oriented to Situation Alcohol / Substance Use: Not Applicable Psych Involvement: No (comment)  Admission diagnosis:  Symptomatic anemia [D64.9] Patient Active Problem List   Diagnosis Date Noted   Longstanding persistent atrial fibrillation (HCC) 09/26/2022   Acute on chronic blood loss anemia 09/25/2022   Generalized pain 08/01/2022   Multifactorial functional impairment 02/13/2022   Vertigo 01/17/2022   History of stroke without residual deficits 01/17/2022   Mild cognitive impairment with memory loss 12/23/2021   Diabetic peripheral neuropathy associated with type 2 diabetes mellitus (HCC) 11/11/2021   Recurrent falls 11/11/2021   Gait instability 09/19/2021   Perennial non-allergic rhinitis 09/19/2021   Chronic, continuous use of opioids for chronic pain 07/10/2021   Polypharmacy 11/08/2020   Personal history of renal cell carcinoma 09/12/2020   Tinea pedis of both feet 08/07/2020   Nocturnal hypoxia per sleep study 07/2019    Chronic iron deficiency anemia    Carotid artery stenosis; s/p R endarterectomy    COPD mixed type (HCC)    Arthritis of both hands 07/01/2019   Pain due to onychomycosis of toenails of both feet 05/27/2019   Stage 3a chronic kidney disease (CKD) (HCC) 12/16/2018   COPD exacerbation (HCC) 11/13/2017   Lichen sclerosus of female genitalia 01/12/2017   Mixed stress and urge urinary incontinence 05/27/2016   Pulmonary hypertension due to chronic obstructive pulmonary disease (HCC) 04/25/2016   Asymptomatic cholelithiasis 09/25/2015   Acute on chronic anemia 12/26/2014    Aortic atherosclerosis (HCC) 10/19/2014   Right nephrolithiasis, asymptomatic (incidental finding) 09/06/2014   Pain of great toes medial nail beds of both feet 05/19/2014   Chronic low back pain 10/06/2012   Long term current use of anticoagulant therapy 09/30/2012   Recurrent candidal intertrigo 07/28/2012   Tobacco use disorder 07/28/2012   Obesity (BMI 30.0-34.9) 10/23/2011   Personal history of colonic polyps 05/14/2011   Sleep disturbance 05/14/2011   Abdominal wall hernia 05/14/2011   Constipation 02/03/2011   Health care maintenance 11/04/2010   PAF (paroxysmal atrial fibrillation) (HCC) 10/22/2010   Chronic congestive heart failure with left ventricular diastolic dysfunction (HCC) 10/21/2010   Fibromyalgia 08/29/2010   Gastroesophageal reflux disease    Type 2 diabetes mellitus with other specified complication (HCC)    Osteoporosis    Peripheral arterial disease (HCC) 05/27/2010   History of mitral valve replacement with bioprosthetic valve 2012   Hypertension 06/19/2008   Hyperlipidemia 11/20/2005   Severe major depression (HCC) 11/19/2005   PCP:  Miguel Aschoff, MD Pharmacy:   [facility] [address] [place] [address] Phone: [phone] Fax: [phone]  [id number] [place],  [place] - [address] AT [facility] [address] & [facility] [address] [place] [address] Phone: [phone] Fax: [phone]     Social Determinants of Health (SDOH) Social History: SDOH Screenings   Food Insecurity: No Food Insecurity (09/25/2022)  Housing: Patient Declined (09/25/2022)  Transportation Needs: No Transportation Needs (09/25/2022)  Utilities: Not At Risk (09/25/2022)  Alcohol Screen: Low Risk  (05/01/2022)  Depression (PHQ2-9): High Risk (07/01/2022)  Financial Resource Strain: Low Risk  (05/01/2022)  Physical Activity: Inactive (05/01/2022)  Social Connections: Socially Isolated (05/01/2022)   Stress: Stress Concern Present (05/01/2022)  Tobacco Use: High Risk (05/01/2022)   SDOH Interventions:     Readmission Risk Interventions     No data to display

## 2022-09-26 NOTE — Patient Outreach (Signed)
[facility]  09/26/2022  Pranaya Conerly Mar 03, 1950 [id number]   Incoming call from patient who needs to cancel her home visit that was scheduled for 10/07/2022. Reports she is in the hospital and is going to rehab.    PLAN: I requested that patient call me back when she gets out of rehab. She agreed.  Rowe Pavy RN, BSN, Careers adviser for [employer] Mobile: [phone]

## 2022-09-26 NOTE — Care Management Obs Status (Signed)
MEDICARE OBSERVATION STATUS NOTIFICATION   Patient Details  Name: [name] MRN: [id number] Date of Birth: 01/27/1951   Medicare Observation Status Notification Given:  Yes    Carley Hammed, LCSW 09/26/2022, 12:09 PM

## 2022-09-26 NOTE — Patient Outreach (Signed)
  Care Coordination   Follow Up Visit Note   09/26/2022 Name: Labarbara Mehall MRN: [id number] DOB: [birth year]  Phill Myron Whisenant is a 72 y.o. year old female who sees [name], [name], MD for primary care. I spoke with  [name] by phone today.  What matters to the patients health and wellness today?  Pt reports admitted to hospital from PCP office yesterday due to low hemoglobin.    Goals Addressed   None     SDOH assessments and interventions completed:  Yes     Care Coordination Interventions:  No, not indicated   Follow up plan: Follow up call scheduled for 09/29/22    Encounter Outcome:  Pt. Visit Completed

## 2022-09-26 NOTE — Evaluation (Signed)
Occupational Therapy Evaluation Patient Details Name: [name] MRN: [id number] DOB: Jun 21, 1950 Today's Date: 09/26/2022   History of Present Illness Pt is a 72 year old female admitted on 09/25/22 with weakness and SOB over the last 3 weeks. PMH of iron deficiency anemia, afib and bioprosthetic mitral valve replacement on warfarin, hx of CVA, history of renal cell carcinoma 2013 in remission, diabetes, COPD, sleep apnea, chronic pain, PAD.   Clinical Impression   Pt lives alone and is independent in self care and IADLs. She mostly furniture walks in her home. Pt presents with decreased activity tolerance, generalized weakness, diffuse pain and poor standing balance. She ambulates with min guard assist with RW to bathroom and requires up to min assist for ADLs. Patient will benefit from continued inpatient follow up therapy, <3 hours/day.       If plan is discharge home, recommend the following: A little help with walking and/or transfers;A little help with bathing/dressing/bathroom;Assistance with cooking/housework;Assist for transportation;Help with stairs or ramp for entrance    Functional Status Assessment  Patient has had a recent decline in their functional status and demonstrates the ability to make significant improvements in function in a reasonable and predictable amount of time.  Equipment Recommendations  None recommended by OT    Recommendations for Other Services       Precautions / Restrictions Precautions Precautions: Fall Restrictions Weight Bearing Restrictions: No      Mobility Bed Mobility Overal bed mobility: Needs Assistance Bed Mobility: Sit to Supine       Sit to supine: Min assist   General bed mobility comments: light min assist for LEs back into bed    Transfers Overall transfer level: Needs assistance Equipment used: Rolling walker (2 wheels) Transfers: Sit to/from Stand Sit to Stand: Contact guard assist           General  transfer comment: from toilet      Balance Overall balance assessment: Needs assistance   Sitting balance-Leahy Scale: Good     Standing balance support: Bilateral upper extremity supported Standing balance-Leahy Scale: Poor Standing balance comment: Reliant on RW, can stand to wash hands without UE support                           ADL either performed or assessed with clinical judgement   ADL Overall ADL's : Needs assistance/impaired Eating/Feeding: Independent   Grooming: Wash/dry hands;Standing;Contact guard assist   Upper Body Bathing: Set up;Sitting   Lower Body Bathing: Contact guard assist;Sit to/from stand   Upper Body Dressing : Set up;Sitting   Lower Body Dressing: Contact guard assist;Sit to/from stand Lower Body Dressing Details (indicate cue type and reason): brief Toilet Transfer: Contact guard assist;Ambulation;Rolling walker (2 wheels);Comfort height toilet   Toileting- Clothing Manipulation and Hygiene: Contact guard assist;Sit to/from stand       Functional mobility during ADLs: Contact guard assist;Rolling walker (2 wheels)       Vision Ability to See in Adequate Light: 0 Adequate Patient Visual Report: No change from baseline       Perception         Praxis         Pertinent Vitals/Pain Pain Assessment Pain Assessment: Faces Faces Pain Scale: Hurts even more Pain Location: generalized Pain Descriptors / Indicators: Discomfort, Grimacing, Constant Pain Intervention(s): Monitored during session, Repositioned, Patient requesting pain meds-RN notified     Extremity/Trunk Assessment Upper Extremity Assessment Upper Extremity Assessment: Overall WFL for  tasks assessed   Lower Extremity Assessment Lower Extremity Assessment: Defer to PT evaluation   Cervical / Trunk Assessment Cervical / Trunk Assessment: Kyphotic   Communication Communication Communication: No apparent difficulties Cueing Techniques: Verbal cues;Tactile  cues   Cognition Arousal: Alert Behavior During Therapy: WFL for tasks assessed/performed, Impulsive Overall Cognitive Status: Within Functional Limits for tasks assessed                                       General Comments  VSS. O2 sats remained above 94% on RA however pt reports wearing 2.5L at home.    Exercises     Shoulder Instructions      Home Living Family/patient expects to be discharged to:: Private residence Living Arrangements: Alone Available Help at Discharge: Other (Comment) (Pt reports no available help close by) Type of Home: House Home Access: Stairs to enter Entrance Stairs-Number of Steps: 1 Entrance Stairs-Rails: None Home Layout: One level     Bathroom Shower/Tub: Chief Strategy Officer: Standard Bathroom Accessibility: Yes   Home Equipment: Teacher, English as a foreign language (2 wheels);Cane - single point;Shower seat - built in;Grab bars - tub/shower;Grab bars - toilet;Hand held shower head          Prior Functioning/Environment Prior Level of Function : Independent/Modified Independent;History of Falls (last six months)             Mobility Comments: Pt reports mostly furniture walking ADLs Comments: Pt reports Ind        OT Problem List: Decreased activity tolerance;Decreased strength;Pain;Impaired balance (sitting and/or standing);Cardiopulmonary status limiting activity      OT Treatment/Interventions: Self-care/ADL training;Energy conservation;DME and/or AE instruction;Patient/family education;Therapeutic activities    OT Goals(Current goals can be found in the care plan section) Acute Rehab OT Goals OT Goal Formulation: With patient Time For Goal Achievement: 10/10/22 Potential to Achieve Goals: Good  OT Frequency: Min 1X/week    Co-evaluation              AM-PAC OT "6 Clicks" Daily Activity     Outcome Measure Help from another person eating meals?: None Help from another person taking care of  personal grooming?: A Little Help from another person toileting, which includes using toliet, bedpan, or urinal?: A Little Help from another person bathing (including washing, rinsing, drying)?: A Little Help from another person to put on and taking off regular upper body clothing?: A Little Help from another person to put on and taking off regular lower body clothing?: A Little 6 Click Score: 19   End of Session Equipment Utilized During Treatment: Gait belt;Rolling walker (2 wheels) Nurse Communication: Patient requests pain meds  Activity Tolerance: Patient tolerated treatment well Patient left: in bed;with call bell/phone within reach;with bed alarm set;with nursing/sitter in room  OT Visit Diagnosis: Unsteadiness on feet (R26.81);Other abnormalities of gait and mobility (R26.89);Pain;Muscle weakness (generalized) (M62.81);History of falling (Z91.81)                Time: 1120-1140 OT Time Calculation (min): 20 min Charges:  OT General Charges $OT Visit: 1 Visit OT Evaluation $OT Eval Moderate Complexity: 1 Mod  Berna Spare, OTR/L Acute Rehabilitation Services Office: [phone]   Evern Bio 09/26/2022, 11:59 AM

## 2022-09-26 NOTE — NC FL2 (Signed)
[place] MEDICAID FL2 LEVEL OF CARE FORM     IDENTIFICATION  Patient Name: [name] Birthdate: 1950/06/06 Sex: female Admission Date (Current Location): 09/25/2022  [facility] and [place] Number:  Producer, television/film/video and Address:  [facility] [place], [address] [place], [place] [address]      Provider Number: [id number]  Attending Physician Name and Address:  Ginnie Smart, MD  Relative Name and Phone Number:  N/A    Current Level of Care: Hospital Recommended Level of Care: Skilled Nursing Facility Prior Approval Number:    Date Approved/Denied:   PASRR Number: [id number]  Discharge Plan: SNF    Current Diagnoses: Patient Active Problem List   Diagnosis Date Noted   Longstanding persistent atrial fibrillation (HCC) 09/26/2022   Acute on chronic blood loss anemia 09/25/2022   Generalized pain 08/01/2022   Multifactorial functional impairment 02/13/2022   Vertigo 01/17/2022   History of stroke without residual deficits 01/17/2022   Mild cognitive impairment with memory loss 12/23/2021   Diabetic peripheral neuropathy associated with type 2 diabetes mellitus (HCC) 11/11/2021   Recurrent falls 11/11/2021   Gait instability 09/19/2021   Perennial non-allergic rhinitis 09/19/2021   Chronic, continuous use of opioids for chronic pain 07/10/2021   Polypharmacy 11/08/2020   Personal history of renal cell carcinoma 09/12/2020   Tinea pedis of both feet 08/07/2020   Nocturnal hypoxia per sleep study 07/2019    Chronic iron deficiency anemia    Carotid artery stenosis; s/p R endarterectomy    COPD mixed type (HCC)    Arthritis of both hands 07/01/2019   Pain due to onychomycosis of toenails of both feet 05/27/2019   Stage 3a chronic kidney disease (CKD) (HCC) 12/16/2018   COPD exacerbation (HCC) 11/13/2017   Lichen sclerosus of female genitalia 01/12/2017   Mixed stress and urge urinary incontinence 05/27/2016   Pulmonary  hypertension due to chronic obstructive pulmonary disease (HCC) 04/25/2016   Asymptomatic cholelithiasis 09/25/2015   Acute on chronic anemia 12/26/2014   Aortic atherosclerosis (HCC) 10/19/2014   Right nephrolithiasis, asymptomatic (incidental finding) 09/06/2014   Pain of great toes medial nail beds of both feet 05/19/2014   Chronic low back pain 10/06/2012   Long term current use of anticoagulant therapy 09/30/2012   Recurrent candidal intertrigo 07/28/2012   Tobacco use disorder 07/28/2012   Obesity (BMI 30.0-34.9) 10/23/2011   Personal history of colonic polyps 05/14/2011   Sleep disturbance 05/14/2011   Abdominal wall hernia 05/14/2011   Constipation 02/03/2011   Health care maintenance 11/04/2010   PAF (paroxysmal atrial fibrillation) (HCC) 10/22/2010   Chronic congestive heart failure with left ventricular diastolic dysfunction (HCC) 10/21/2010   Fibromyalgia 08/29/2010   Gastroesophageal reflux disease    Type 2 diabetes mellitus with other specified complication (HCC)    Osteoporosis    Peripheral arterial disease (HCC) 05/27/2010   History of mitral valve replacement with bioprosthetic valve 2012   Hypertension 06/19/2008   Hyperlipidemia 11/20/2005   Severe major depression (HCC) 11/19/2005    Orientation RESPIRATION BLADDER Height & Weight     Self, Time, Situation, Place  Normal Continent Weight: 170 lb 3.1 oz (77.2 kg) Height:  5\' 2"  (157.5 cm)  BEHAVIORAL SYMPTOMS/MOOD NEUROLOGICAL BOWEL NUTRITION STATUS      Continent Diet (See DC summary)  AMBULATORY STATUS COMMUNICATION OF NEEDS Skin   Extensive Assist Verbally Skin abrasions (MASD bilateral Groin and lower Abdomen)  Personal Care Assistance Level of Assistance  Bathing, Feeding, Dressing Bathing Assistance: Maximum assistance Feeding assistance: Independent Dressing Assistance: Maximum assistance     Functional Limitations Info  Sight, Hearing, Speech Sight Info:  Adequate Hearing Info: Adequate Speech Info: Adequate    SPECIAL CARE FACTORS FREQUENCY  PT (By licensed PT), OT (By licensed OT)     PT Frequency: 5x week OT Frequency: 5x week            Contractures Contractures Info: Not present    Additional Factors Info  Code Status, Allergies, Psychotropic, Insulin Sliding Scale Code Status Info: Full Allergies Info: Dexilant (Dexlansoprazole)  Lorazepam  Oxycontin (Oxycodone)  Tramadol Hcl Psychotropic Info: Alprazolam, Duloxetine Insulin Sliding Scale Info: See DC summary       Current Medications (09/26/2022):  This is the current hospital active medication list Current Facility-Administered Medications  Medication Dose Route Frequency Provider Last Rate Last Admin   ALPRAZolam Prudy Feeler) tablet 0.5 mg  0.5 mg Oral Once Laretta Bolster, MD       ALPRAZolam Prudy Feeler) tablet 0.5-1 mg  0.5-1 mg Oral QHS PRN Masters, Florentina Addison, DO   0.5 mg at 09/25/22 2351   azithromycin (ZITHROMAX) tablet 500 mg  500 mg Oral Daily Masters, Katie, DO   500 mg at 09/26/22 0843   buPROPion (WELLBUTRIN XL) 24 hr tablet 150 mg  150 mg Oral BH-q7a Masters, Katie, DO   150 mg at 09/26/22 1610   docusate sodium (COLACE) capsule 50 mg  50 mg Oral Daily Nooruddin, Saad, MD   50 mg at 09/26/22 1109   DULoxetine (CYMBALTA) DR capsule 60 mg  60 mg Oral BID Masters, Katie, DO   60 mg at 09/26/22 0843   furosemide (LASIX) tablet 80 mg  80 mg Oral q morning Masters, Katie, DO   80 mg at 09/26/22 0843   gabapentin (NEURONTIN) capsule 600 mg  600 mg Oral QHS Masters, Katie, DO   600 mg at 09/25/22 2100   HYDROcodone-acetaminophen (NORCO/VICODIN) 5-325 MG per tablet 2 tablet  2 tablet Oral Q8H PRN Masters, Katie, DO   2 tablet at 09/26/22 0504   insulin aspart (novoLOG) injection 0-15 Units  0-15 Units Subcutaneous TID WC Masters, Katie, DO   5 Units at 09/26/22 0847   insulin glargine-yfgn (SEMGLEE) injection 44 Units  44 Units Subcutaneous Daily Laretta Bolster, MD   44 Units at  09/26/22 1212   ipratropium-albuterol (DUONEB) 0.5-2.5 (3) MG/3ML nebulizer solution 3 mL  3 mL Nebulization Q6H Masters, Katie, DO   3 mL at 09/26/22 0810   linaclotide (LINZESS) capsule 72 mcg  72 mcg Oral QAC breakfast Masters, Katie, DO   72 mcg at 09/26/22 0843   loratadine (CLARITIN) tablet 10 mg  10 mg Oral Daily Masters, Katie, DO   10 mg at 09/26/22 9604   Muscle Rub CREA 1 Application  1 Application Topical PRN Ginnie Smart, MD       nicotine (NICODERM CQ - dosed in mg/24 hours) patch 14 mg  14 mg Transdermal Daily Nooruddin, Saad, MD   14 mg at 09/26/22 0904   polyethylene glycol (MIRALAX / GLYCOLAX) packet 17 g  17 g Oral Daily Masters, Katie, DO   17 g at 09/26/22 0841   potassium chloride SA (KLOR-CON M) CR tablet 20 mEq  20 mEq Oral Daily Masters, Katie, DO   20 mEq at 09/26/22 5409   predniSONE (DELTASONE) tablet 40 mg  40 mg Oral Q breakfast Masters, [place], DO  40 mg at 09/26/22 0843   rosuvastatin (CRESTOR) tablet 20 mg  20 mg Oral QHS Masters, Katie, DO   20 mg at 09/25/22 2100   senna (SENOKOT) tablet 8.6 mg  1 tablet Oral Daily Masters, Katie, DO   8.6 mg at 09/26/22 2130   warfarin (COUMADIN) tablet 2.5 mg  2.5 mg Oral q1600 Nooruddin, Jason Fila, MD       Warfarin - Physician Dosing Inpatient   Does not apply [id number] Ginnie Smart, MD         Discharge Medications: Please see discharge summary for a list of discharge medications.  Relevant Imaging Results:  Relevant Lab Results:   Additional Information ss#[id number]  Carley Hammed, LCSW

## 2022-09-26 NOTE — Plan of Care (Signed)
  Problem: Education: Goal: Knowledge of General Education information will improve Description: Including pain rating scale, medication(s)/side effects and non-pharmacologic comfort measures Outcome: Progressing   Problem: Health Behavior/Discharge Planning: Goal: Ability to manage health-related needs will improve Outcome: Progressing   Problem: Clinical Measurements: Goal: Ability to maintain clinical measurements within normal limits will improve Outcome: Progressing Goal: Will remain free from infection Outcome: Progressing Goal: Diagnostic test results will improve Outcome: Progressing Goal: Respiratory complications will improve Outcome: Progressing Goal: Cardiovascular complication will be avoided Outcome: Progressing   Problem: Activity: Goal: Risk for activity intolerance will decrease Outcome: Progressing   Problem: Nutrition: Goal: Adequate nutrition will be maintained Outcome: Progressing   Problem: Coping: Goal: Level of anxiety will decrease Outcome: Progressing   Problem: Elimination: Goal: Will not experience complications related to bowel motility Outcome: Progressing Goal: Will not experience complications related to urinary retention Outcome: Progressing   Problem: Pain Managment: Goal: General experience of comfort will improve Outcome: Progressing   Problem: Safety: Goal: Ability to remain free from injury will improve Outcome: Progressing   Problem: Skin Integrity: Goal: Risk for impaired skin integrity will decrease Outcome: Progressing   Problem: Education: Goal: Ability to describe self-care measures that may prevent or decrease complications (Diabetes Survival Skills Education) will improve Outcome: Progressing Goal: Individualized Educational Video(s) Outcome: Progressing   Problem: Coping: Goal: Ability to adjust to condition or change in health will improve Outcome: Progressing   Problem: Fluid Volume: Goal: Ability to  maintain a balanced intake and output will improve Outcome: Progressing   Problem: Health Behavior/Discharge Planning: Goal: Ability to identify and utilize available resources and services will improve Outcome: Progressing Goal: Ability to manage health-related needs will improve Outcome: Progressing   Problem: Metabolic: Goal: Ability to maintain appropriate glucose levels will improve Outcome: Progressing   Problem: Nutritional: Goal: Maintenance of adequate nutrition will improve Outcome: Progressing Goal: Progress toward achieving an optimal weight will improve Outcome: Progressing   Problem: Skin Integrity: Goal: Risk for impaired skin integrity will decrease Outcome: Progressing   Problem: Tissue Perfusion: Goal: Adequacy of tissue perfusion will improve Outcome: Progressing   Levenia Skalicky Tamera Stands, RN

## 2022-09-27 DIAGNOSIS — D509 Iron deficiency anemia, unspecified: Secondary | ICD-10-CM

## 2022-09-27 DIAGNOSIS — Z9841 Cataract extraction status, right eye: Secondary | ICD-10-CM | POA: Diagnosis not present

## 2022-09-27 DIAGNOSIS — Z885 Allergy status to narcotic agent status: Secondary | ICD-10-CM | POA: Diagnosis not present

## 2022-09-27 DIAGNOSIS — I110 Hypertensive heart disease with heart failure: Secondary | ICD-10-CM | POA: Diagnosis not present

## 2022-09-27 DIAGNOSIS — K5909 Other constipation: Secondary | ICD-10-CM | POA: Diagnosis not present

## 2022-09-27 DIAGNOSIS — D649 Anemia, unspecified: Secondary | ICD-10-CM | POA: Diagnosis not present

## 2022-09-27 DIAGNOSIS — Z953 Presence of xenogenic heart valve: Secondary | ICD-10-CM | POA: Diagnosis not present

## 2022-09-27 DIAGNOSIS — Z7985 Long-term (current) use of injectable non-insulin antidiabetic drugs: Secondary | ICD-10-CM | POA: Diagnosis not present

## 2022-09-27 DIAGNOSIS — G4733 Obstructive sleep apnea (adult) (pediatric): Secondary | ICD-10-CM | POA: Diagnosis not present

## 2022-09-27 DIAGNOSIS — Z8673 Personal history of transient ischemic attack (TIA), and cerebral infarction without residual deficits: Secondary | ICD-10-CM | POA: Diagnosis not present

## 2022-09-27 DIAGNOSIS — Z79899 Other long term (current) drug therapy: Secondary | ICD-10-CM | POA: Diagnosis not present

## 2022-09-27 DIAGNOSIS — Z7901 Long term (current) use of anticoagulants: Secondary | ICD-10-CM | POA: Diagnosis not present

## 2022-09-27 DIAGNOSIS — I272 Pulmonary hypertension, unspecified: Secondary | ICD-10-CM | POA: Diagnosis not present

## 2022-09-27 DIAGNOSIS — F17210 Nicotine dependence, cigarettes, uncomplicated: Secondary | ICD-10-CM

## 2022-09-27 DIAGNOSIS — I10 Essential (primary) hypertension: Secondary | ICD-10-CM | POA: Diagnosis not present

## 2022-09-27 DIAGNOSIS — Z888 Allergy status to other drugs, medicaments and biological substances status: Secondary | ICD-10-CM | POA: Diagnosis not present

## 2022-09-27 DIAGNOSIS — Z9842 Cataract extraction status, left eye: Secondary | ICD-10-CM | POA: Diagnosis not present

## 2022-09-27 DIAGNOSIS — Z961 Presence of intraocular lens: Secondary | ICD-10-CM | POA: Diagnosis not present

## 2022-09-27 DIAGNOSIS — E1169 Type 2 diabetes mellitus with other specified complication: Secondary | ICD-10-CM | POA: Diagnosis not present

## 2022-09-27 DIAGNOSIS — R0902 Hypoxemia: Secondary | ICD-10-CM | POA: Diagnosis not present

## 2022-09-27 DIAGNOSIS — I050 Rheumatic mitral stenosis: Secondary | ICD-10-CM | POA: Diagnosis not present

## 2022-09-27 DIAGNOSIS — D62 Acute posthemorrhagic anemia: Secondary | ICD-10-CM | POA: Diagnosis not present

## 2022-09-27 DIAGNOSIS — Z85528 Personal history of other malignant neoplasm of kidney: Secondary | ICD-10-CM | POA: Diagnosis not present

## 2022-09-27 DIAGNOSIS — I4811 Longstanding persistent atrial fibrillation: Secondary | ICD-10-CM | POA: Diagnosis not present

## 2022-09-27 DIAGNOSIS — E1165 Type 2 diabetes mellitus with hyperglycemia: Secondary | ICD-10-CM | POA: Diagnosis not present

## 2022-09-27 DIAGNOSIS — J441 Chronic obstructive pulmonary disease with (acute) exacerbation: Secondary | ICD-10-CM

## 2022-09-27 LAB — TYPE AND SCREEN
ABO/RH(D): A NEG
Antibody Screen: NEGATIVE
Unit division: 0
Unit division: 0

## 2022-09-27 LAB — BPAM RBC
Blood Product Expiration Date: 202408202359
Blood Product Expiration Date: 202408222359
ISSUE DATE / TIME: 202408151414
ISSUE DATE / TIME: 202408161502
Unit Type and Rh: 600
Unit Type and Rh: 600

## 2022-09-27 LAB — CBC
HCT: 31.3 % — ABNORMAL LOW (ref 36.0–46.0)
Hemoglobin: 9.1 g/dL — ABNORMAL LOW (ref 12.0–15.0)
MCH: 21.9 pg — ABNORMAL LOW (ref 26.0–34.0)
MCHC: 29.1 g/dL — ABNORMAL LOW (ref 30.0–36.0)
MCV: 75.4 fL — ABNORMAL LOW (ref 80.0–100.0)
Platelets: 227 10*3/uL (ref 150–400)
RBC: 4.15 MIL/uL (ref 3.87–5.11)
RDW: 20.3 % — ABNORMAL HIGH (ref 11.5–15.5)
WBC: 11.3 10*3/uL — ABNORMAL HIGH (ref 4.0–10.5)
nRBC: 1 % — ABNORMAL HIGH (ref 0.0–0.2)

## 2022-09-27 LAB — BASIC METABOLIC PANEL
Anion gap: 10 (ref 5–15)
BUN: 19 mg/dL (ref 8–23)
CO2: 27 mmol/L (ref 22–32)
Calcium: 9.5 mg/dL (ref 8.9–10.3)
Chloride: 99 mmol/L (ref 98–111)
Creatinine, Ser: 1.05 mg/dL — ABNORMAL HIGH (ref 0.44–1.00)
GFR, Estimated: 56 mL/min — ABNORMAL LOW (ref >60)
Glucose, Bld: 113 mg/dL — ABNORMAL HIGH (ref 70–99)
Potassium: 3.8 mmol/L (ref 3.5–5.1)
Sodium: 136 mmol/L (ref 135–145)

## 2022-09-27 LAB — GLUCOSE, CAPILLARY
Glucose-Capillary: 126 mg/dL — ABNORMAL HIGH (ref 70–99)
Glucose-Capillary: 376 mg/dL — ABNORMAL HIGH (ref 70–99)
Glucose-Capillary: 368 mg/dL — ABNORMAL HIGH (ref 70–99)
Glucose-Capillary: 211 mg/dL — ABNORMAL HIGH (ref 70–99)

## 2022-09-27 LAB — PROTIME-INR
INR: 1.9 — ABNORMAL HIGH (ref 0.8–1.2)
Prothrombin Time: 21.7 s — ABNORMAL HIGH (ref 11.4–15.2)

## 2022-09-27 NOTE — Plan of Care (Signed)

## 2022-09-27 NOTE — Progress Notes (Signed)
HD#0 Subjective:  Overnight Events: No acute event overnight.  This morning, she reports improvement in her breathing. Patient has been accepted to SNF and is due to leave on 8/19. Objective:  Vital signs in last 24 hours: Vitals:   09/27/22 0119 09/27/22 0509 09/27/22 0846 09/27/22 0932  BP:  (!) 166/59 102/86 (!) 148/51  Pulse: 78 82 83   Resp: 19 20 18 20   Temp:  97.7 F (36.5 C) 98.4 F (36.9 C)   TempSrc:  Oral Oral   SpO2: 94% 96% 91%   Weight:      Height:       Supplemental O2: Room Air SpO2: 91 % O2 Flow Rate (L/min): 0 L/min FiO2 (%): 28 %   Physical Exam:   General: Well appearing, Not in acute distress. Pulmonary: Lungs clear bilaterally. Normal work of breathing. No wheezes. Abdominal: Soft, nontender, nondistended. Normal bowel sounds. Extremities: Palpable dorsalis pedis and radialis pulses.  Filed Weights   09/25/22 1306  Weight: 77.2 kg    Intake/Output Summary (Last 24 hours) at 09/27/2022 1034 Last data filed at 09/27/2022 0900 Gross per 24 hour  Intake 877.92 ml  Output 0 ml  Net 877.92 ml   Net IO Since Admission: 2,151.92 mL [09/27/22 1034]  Recent Labs    09/26/22 1635 09/26/22 2018 09/27/22 0846  GLUCAP 350* 254* 126*     Pertinent Labs:    Latest Ref Rng & Units 09/27/2022    7:30 AM 09/26/2022    7:52 PM 09/26/2022    3:02 AM  CBC  WBC 4.0 - 10.5 K/uL 11.3   7.6   Hemoglobin 12.0 - 15.0 g/dL 9.1  8.8  7.6   Hematocrit 36.0 - 46.0 % 31.3  30.2  25.7   Platelets 150 - 400 K/uL 227   214        Latest Ref Rng & Units 09/27/2022    7:30 AM 09/26/2022    3:02 AM 09/25/2022    9:50 AM  CMP  Glucose 70 - 99 mg/dL 161  096  045   BUN 8 - 23 mg/dL 19  14  15    Creatinine 0.44 - 1.00 mg/dL 4.09  8.11  9.14   Sodium 135 - 145 mmol/L 136  134  138   Potassium 3.5 - 5.1 mmol/L 3.8  4.4  3.9   Chloride 98 - 111 mmol/L 99  103  105   CO2 22 - 32 mmol/L 27  23  24    Calcium 8.9 - 10.3 mg/dL 9.5  8.9  8.8   Total Protein 6.5 -  8.1 g/dL   6.3   Total Bilirubin 0.3 - 1.2 mg/dL   0.4   Alkaline Phos 38 - 126 U/L   54   AST 15 - 41 U/L   9   ALT 0 - 44 U/L   10     Imaging: No results found.  Assessment/Plan:   Active Problems:   Hypertension   Type 2 diabetes mellitus with other specified complication (HCC)   Constipation   Tobacco use disorder   Acute on chronic anemia   COPD exacerbation (HCC)   Acute on chronic blood loss anemia   Longstanding persistent atrial fibrillation (HCC)   Symptomatic anemia   Patient Summary: [name] is a 72 year old with PMH of iron deficiency anemia, afib and bioprosthetic mitral valve replacement on warfarin, hx of CVA, history of renal cell carcinoma 2013 in remission, diabetes, COPD, sleep  apnea, chronic pain, PAD who presents with 3 weeks of worsening weakness and shortness of breath and admitted for symptomatic anemia.   Symptomatic IDA  She is S/p 2 units of red blood cell transfusion yesterday, 8/16.  Hb currently stable at 8.8.  She also received 500 mg of IV iron on 8/15.  Unclear the the etiology of patient's recurrent symptomatic anemia. FOBT negative. She had colonoscopy in 2022 that was normal.  EGD was also normal. Capsule endoscopy completed in 2017 which showed small AVM but non-bleeding. Patient was seen by PT, and is recommending SNF. -Trend CBC -Dispo to SNF on 8/19.   COPD exacerbation  Stable. No wheezes on her lung exam today. -Prednisone 40 mg (#3/5)until 08/20 -Azithromycin 500 mg(#2/3) -nicotine patch.  3. Atrial fibrillation History of mitral valve replacement 2/2 mitral stenosis Patient's warfarin was started yesterday 8/16, INR is subtherapeutic, 1.9.  Patient's INR goal is 2.0-2.5. -Continue metoprolol 25 mg -Continue warfarin 2.5 mg. -Recheck INR on 8/19.  4. Chronic constipation  No bowel movement for a week.  -linzess. -MiraLAX twice daily -Senna  5. Diabetes Blood glucose elevated 254-350 -Restarted 44 units  Semglee. -SSI  Diet: Regular diet IVF: PO intake VTE: SCDs Start: 09/25/22 1243 Code: Full PT/OT: SNF for Subacute PT ID:  Anti-infectives (From admission, onward)    Start     Dose/Rate Route Frequency Ordered Stop   09/25/22 1400  azithromycin (ZITHROMAX) tablet 500 mg        500 mg Oral Daily 09/25/22 1307 09/27/22 0911        Anticipated discharge to Skilled nursing facility    .  [name], M.D.  Internal Medicine Resident, PGY-1 [facility]  Pager: [phone] 10:34 AM, 09/27/2022   **Please contact the on call pager after 5 pm and on weekends at [phone].**

## 2022-09-27 NOTE — TOC Progression Note (Signed)
Transition of Care [facility]) - Progression Note    Patient Details  Name: [name] MRN: [id number] Date of Birth: 1950-12-11  [facility] CM/SW Contact  Donnalee Curry, LCSWA Phone Number: 09/27/2022, 12:57 PM  Clinical Narrative:     Per Noorunddin, MD, pt has not required blood products and OK to start auth.   SW attempted to start auth with [employer] ([phone]) but received message from rep, stating will callback.    Expected Discharge Plan: Skilled Nursing Facility Barriers to Discharge: Continued Medical Work up, SNF Pending bed offer, English as a second language teacher  Expected Discharge Plan and Services     Post Acute Care Choice: Skilled Nursing Facility Living arrangements for the past 2 months: Single Family Home                                       Social Determinants of Health (SDOH) Interventions SDOH Screenings   Food Insecurity: No Food Insecurity (09/25/2022)  Housing: Patient Declined (09/25/2022)  Transportation Needs: No Transportation Needs (09/25/2022)  Utilities: Not At Risk (09/25/2022)  Alcohol Screen: Low Risk  (05/01/2022)  Depression (PHQ2-9): High Risk (07/01/2022)  Financial Resource Strain: Low Risk  (05/01/2022)  Physical Activity: Inactive (05/01/2022)  Social Connections: Socially Isolated (05/01/2022)  Stress: Stress Concern Present (05/01/2022)  Tobacco Use: High Risk (05/01/2022)    Readmission Risk Interventions     No data to display

## 2022-09-28 DIAGNOSIS — F17210 Nicotine dependence, cigarettes, uncomplicated: Secondary | ICD-10-CM | POA: Diagnosis not present

## 2022-09-28 DIAGNOSIS — J441 Chronic obstructive pulmonary disease with (acute) exacerbation: Secondary | ICD-10-CM | POA: Diagnosis not present

## 2022-09-28 DIAGNOSIS — I10 Essential (primary) hypertension: Secondary | ICD-10-CM | POA: Diagnosis not present

## 2022-09-28 DIAGNOSIS — D509 Iron deficiency anemia, unspecified: Secondary | ICD-10-CM | POA: Diagnosis not present

## 2022-09-28 LAB — PROTIME-INR
INR: 1.7 — ABNORMAL HIGH (ref 0.8–1.2)
Prothrombin Time: 19.9 s — ABNORMAL HIGH (ref 11.4–15.2)

## 2022-09-28 LAB — CBC
HCT: 33 % — ABNORMAL LOW (ref 36.0–46.0)
Hemoglobin: 9.4 g/dL — ABNORMAL LOW (ref 12.0–15.0)
MCH: 21.7 pg — ABNORMAL LOW (ref 26.0–34.0)
MCHC: 28.5 g/dL — ABNORMAL LOW (ref 30.0–36.0)
MCV: 76 fL — ABNORMAL LOW (ref 80.0–100.0)
Platelets: 265 10*3/uL (ref 150–400)
RBC: 4.34 MIL/uL (ref 3.87–5.11)
RDW: 20.6 % — ABNORMAL HIGH (ref 11.5–15.5)
WBC: 13.2 10*3/uL — ABNORMAL HIGH (ref 4.0–10.5)
nRBC: 1.3 % — ABNORMAL HIGH (ref 0.0–0.2)

## 2022-09-28 LAB — GLUCOSE, CAPILLARY
Glucose-Capillary: 308 mg/dL — ABNORMAL HIGH (ref 70–99)
Glucose-Capillary: 170 mg/dL — ABNORMAL HIGH (ref 70–99)
Glucose-Capillary: 348 mg/dL — ABNORMAL HIGH (ref 70–99)
Glucose-Capillary: 253 mg/dL — ABNORMAL HIGH (ref 70–99)

## 2022-09-28 IMAGING — CT CT CHEST LUNG CANCER SCREENING LOW DOSE W/O CM
2 of 5 series · 14 of 40 positions shown, 17 images · non-contrast
Comparison: CT abdomen pelvis 08/23/2015 and CT chest 10/13/2014.

CLINICAL DATA: Current smoker, 60 pack-year history.

EXAM:
CT CHEST WITHOUT CONTRAST LOW-DOSE FOR LUNG CANCER SCREENING
TECHNIQUE: Multidetector CT imaging of the chest was performed following the
standard protocol without IV contrast.

[Series 4: lung 1.00 br44 cor · coronal · 0.59mm/px · 3 of 335 slices shown]
[im 67/335  lung]
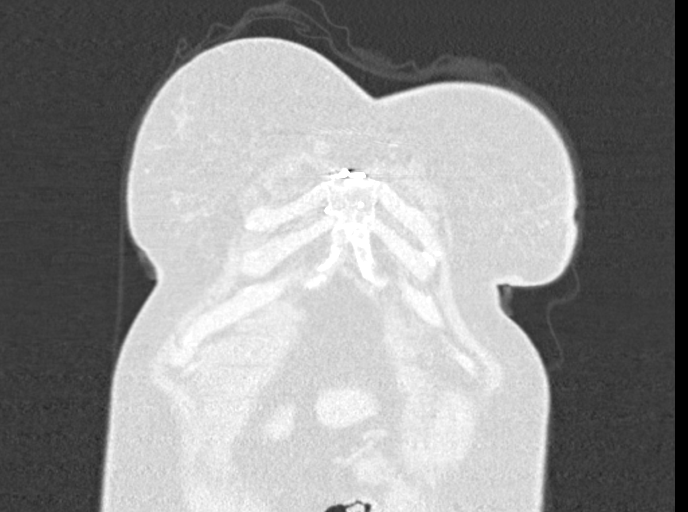
[im 134/335  lung]
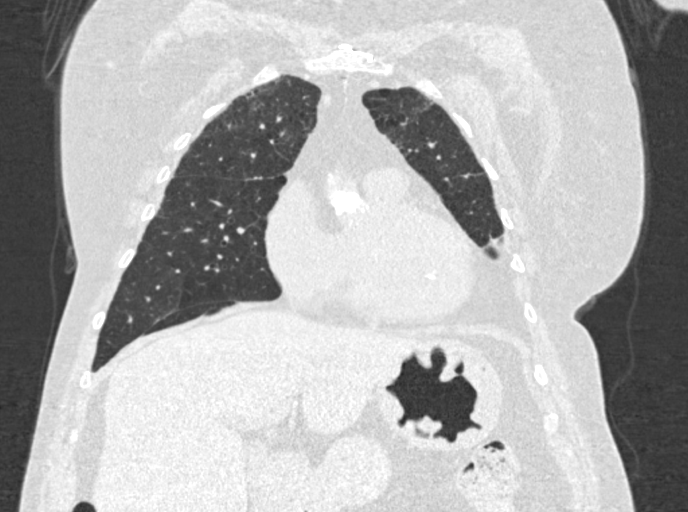
[im 201/335  lung]
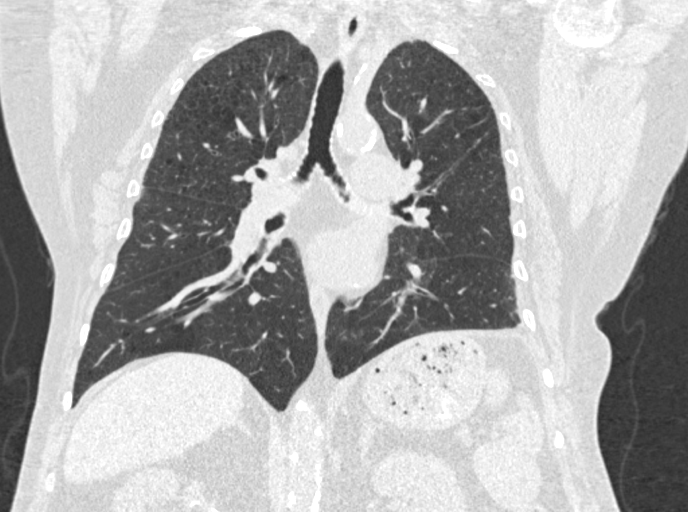

[Series 9: lung 1.00 br60 axial · axial · 0.71mm/px · z∈[-1089,-819]mm · 11 of 300 slices shown, 14 images]
[im 15/300  mediastinal]
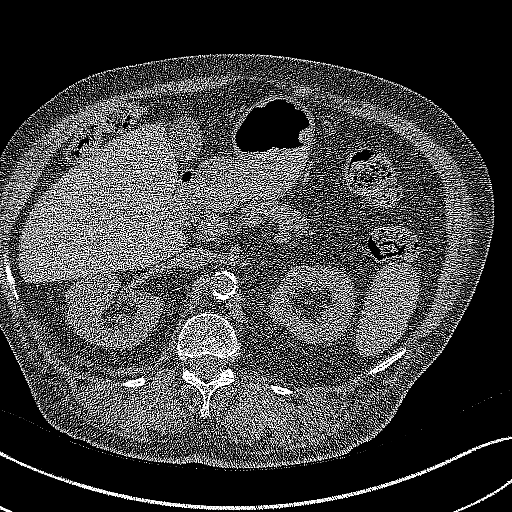
[im 15/300  lung]
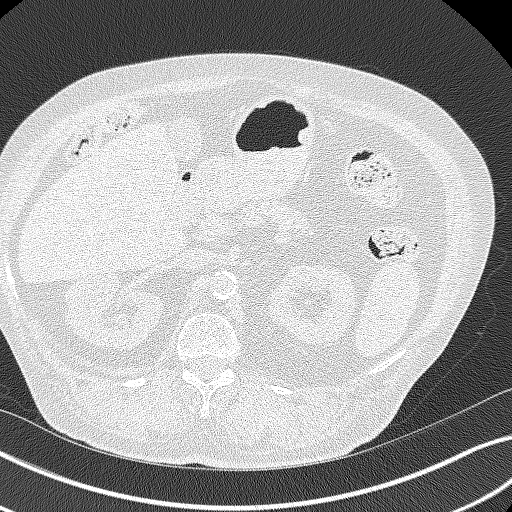
[im 43/300  lung]
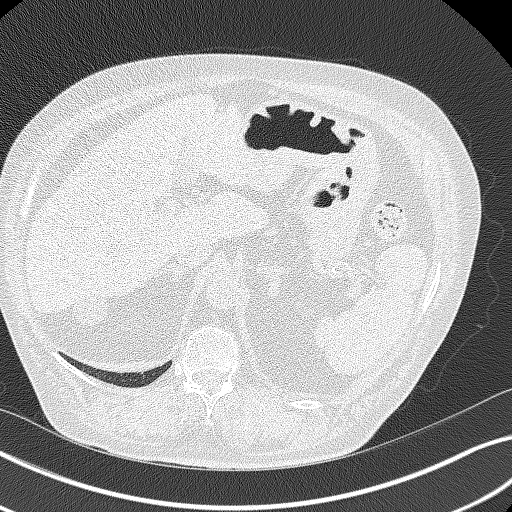
[im 72/300  lung]
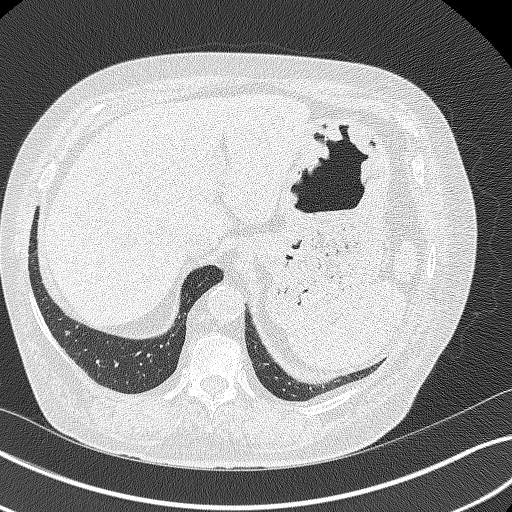
[im 100/300  lung]
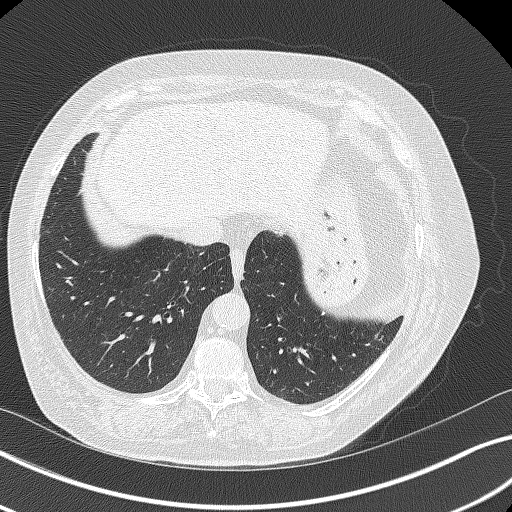
[im 129/300  mediastinal]
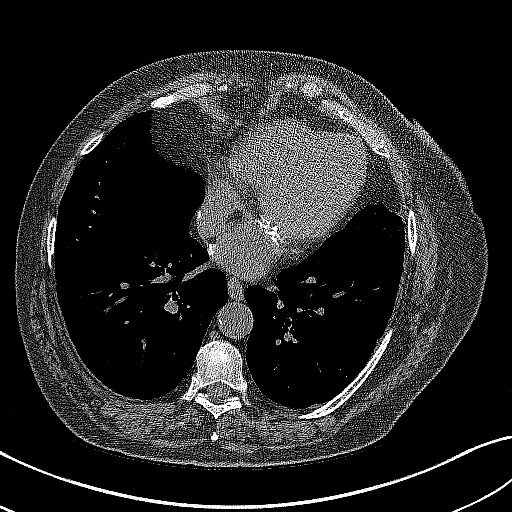
[im 129/300  lung]
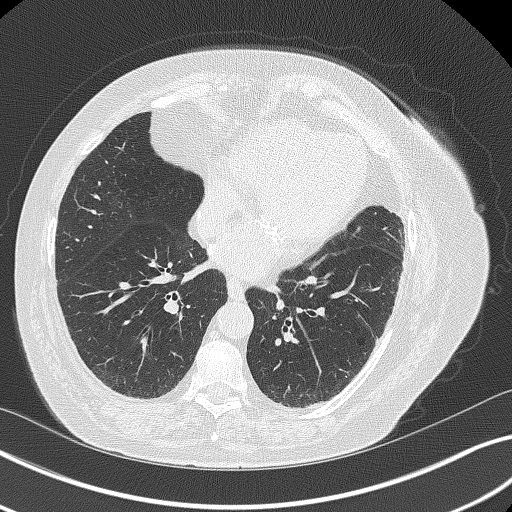
[im 157/300  lung]
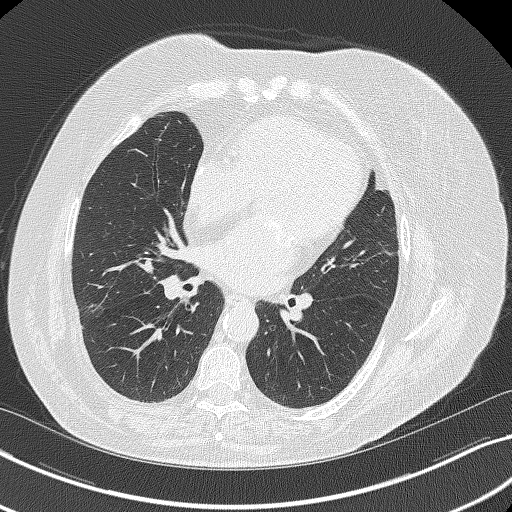
[im 171/300  lung]
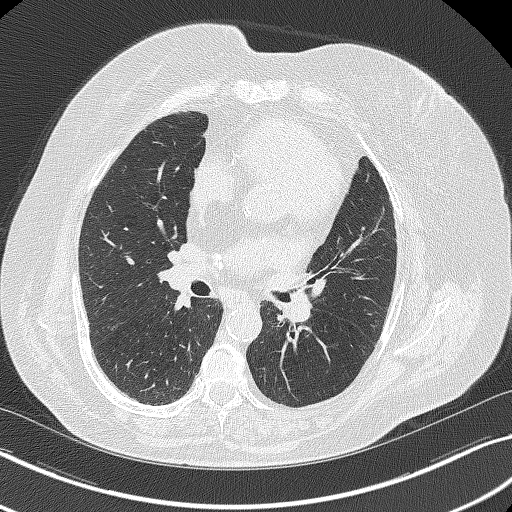
[im 200/300  lung]
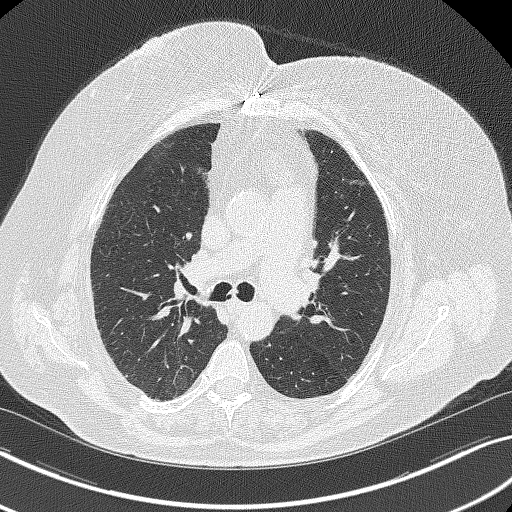
[im 228/300  mediastinal]
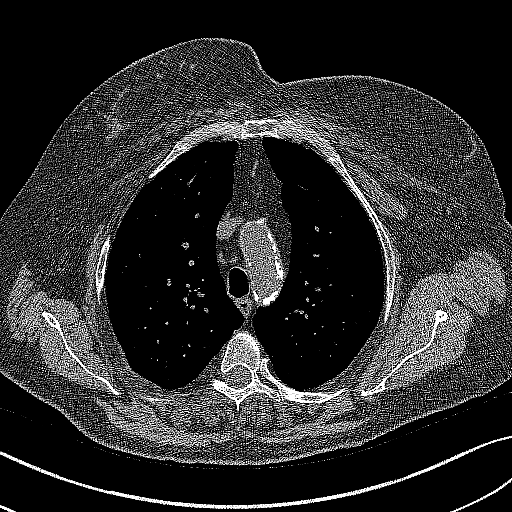
[im 228/300  lung]
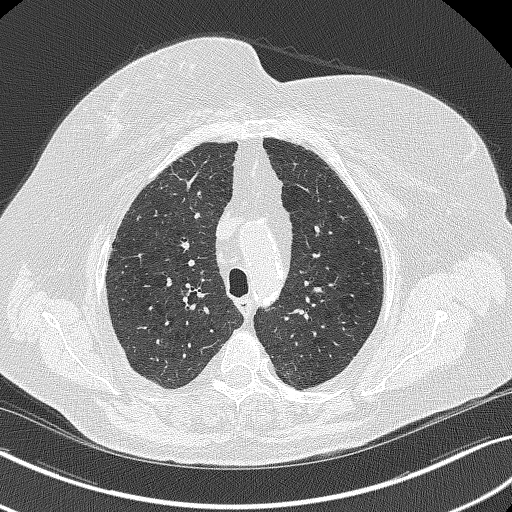
[im 257/300  lung]
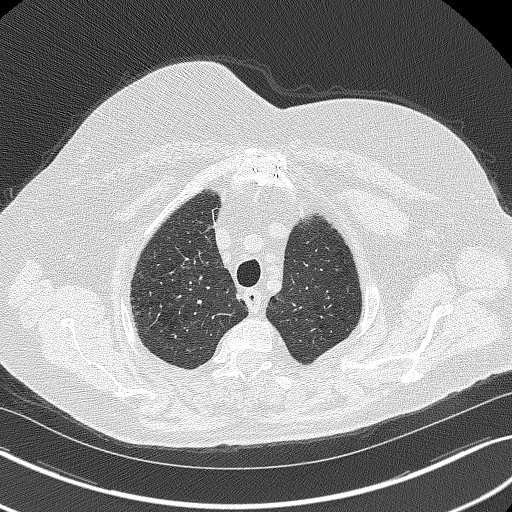
[im 285/300  lung]
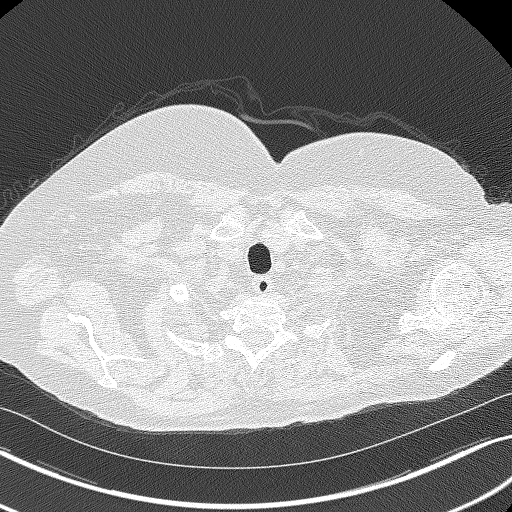

[14 of 40 positions shown; findings below may reference images not displayed]

FINDINGS: Cardiovascular: Atherosclerotic calcification of the aorta, aortic
valve and coronary arteries. Heart is enlarged. No pericardial
effusion.

Mediastinum/Nodes: Mediastinal lymph nodes are not enlarged by CT
size criteria. Hilar regions are difficult to definitively evaluate
without IV contrast but appear grossly unremarkable. No axillary
adenopathy. Esophagus is grossly unremarkable.

Lungs/Pleura: Centrilobular and paraseptal emphysema. Smoking
related respiratory bronchiolitis. Calcified granulomas.
Pleuroparenchymal scarring in the left lower lobe. 3.0 mm posterior
right upper lobe nodule. No suspicious pulmonary nodules. No pleural
fluid. Debris is seen in the airway.

Upper Abdomen: Visualized portion of the liver is unremarkable.
Small stones are seen in the gallbladder. Right adrenal gland is
unremarkable. Nodular thickening of the left adrenal gland. 3.0 cm
low-attenuation lesion off the upper pole right kidney is difficult
to definitively characterize without post-contrast imaging. It has
enlarged from 10 mm on 08/23/2015. Visualized portions of the left
kidney, spleen, pancreas, stomach and bowel are grossly
unremarkable.

Musculoskeletal: Old rib fractures. Mild degenerative changes in the
spine.
IMPRESSION: 1. Lung-RADS 2, benign appearance or behavior. Continue annual
screening with low-dose chest CT without contrast in 12 months.
2. Cholelithiasis.
3. 3.0 cm low-attenuation lesion off the upper pole right kidney is
difficult to definitively characterize without post-contrast imaging
and has enlarged from 10 mm on 08/23/2015. Consider further
evaluation with ultrasound, as clinically indicated.
4. Aortic atherosclerosis (R60EG-[id number]). Coronary artery
calcification.
5.  Emphysema (R60EG-QSK.1).

## 2022-09-28 MED ORDER — IPRATROPIUM BROMIDE 0.03 % NA SOLN
2.0000 | Freq: Three times a day (TID) | NASAL | Status: DC
  Filled 2022-09-28 (×2): qty 30

## 2022-09-28 MED ORDER — IPRATROPIUM-ALBUTEROL 0.5-2.5 (3) MG/3ML IN SOLN
3.0000 mL | Freq: Once | RESPIRATORY_TRACT | Status: AC
  Administered 2022-09-28: 3 mL via RESPIRATORY_TRACT
  Filled 2022-09-28: qty 3

## 2022-09-28 MED ORDER — IPRATROPIUM BROMIDE 0.06 % NA SOLN
1.0000 | Freq: Three times a day (TID) | NASAL | Status: DC
  Administered 2022-09-28 – 2022-09-29 (×4): 1 via NASAL
  Filled 2022-09-28: qty 15

## 2022-09-28 MED ORDER — KETOCONAZOLE 2 % EX CREA
1.0000 | TOPICAL_CREAM | Freq: Every day | CUTANEOUS | Status: DC | PRN
  Filled 2022-09-28: qty 15

## 2022-09-28 NOTE — Plan of Care (Signed)
  Problem: Activity: Goal: Risk for activity intolerance will decrease Outcome: Progressing   Problem: Nutrition: Goal: Adequate nutrition will be maintained Outcome: Progressing   Problem: Elimination: Goal: Will not experience complications related to bowel motility Outcome: Progressing   Problem: Pain Managment: Goal: General experience of comfort will improve Outcome: Progressing   Problem: Safety: Goal: Ability to remain free from injury will improve Outcome: Progressing   Problem: Nutritional: Goal: Maintenance of adequate nutrition will improve Outcome: Progressing

## 2022-09-28 NOTE — TOC Progression Note (Addendum)
Transition of Care [facility]) - Progression Note    Patient Details  Name: [name] MRN: [id number] Date of Birth: January 19, 1951  Transition of Care [facility]) CM/SW Contact  Jimmy Picket, [place] Phone Number: 09/28/2022, 12:25 PM  Clinical Narrative:     CSW started insurance auth with [employer]. CSW gave weekday CSW number for follow up.   Expected Discharge Plan: Skilled Nursing Facility Barriers to Discharge: Continued Medical Work up, SNF Pending bed offer, English as a second language teacher  Expected Discharge Plan and Services     Post Acute Care Choice: Skilled Nursing Facility Living arrangements for the past 2 months: Single Family Home                                       Social Determinants of Health (SDOH) Interventions SDOH Screenings   Food Insecurity: No Food Insecurity (09/25/2022)  Housing: Patient Declined (09/25/2022)  Transportation Needs: No Transportation Needs (09/25/2022)  Utilities: Not At Risk (09/25/2022)  Alcohol Screen: Low Risk  (05/01/2022)  Depression (PHQ2-9): High Risk (07/01/2022)  Financial Resource Strain: Low Risk  (05/01/2022)  Physical Activity: Inactive (05/01/2022)  Social Connections: Socially Isolated (05/01/2022)  Stress: Stress Concern Present (05/01/2022)  Tobacco Use: High Risk (05/01/2022)    Readmission Risk Interventions     No data to display

## 2022-09-28 NOTE — Progress Notes (Signed)
HD#3 Subjective:  Overnight Events: No acute event overnight.  Pt was seen bedside this AM. She states she is doing well, with no acute complaints.  Objective:  Vital signs in last 24 hours: Vitals:   09/27/22 2146 09/28/22 0643 09/28/22 0750 09/28/22 1005  BP: (!) 158/97 (!) 151/52 (!) 128/109 (!) 174/64  Pulse: (!) 54 85 83 80  Resp: 18 18 18  (!) 22  Temp: 98.3 F (36.8 C) 98.2 F (36.8 C) 98 F (36.7 C) 97.7 F (36.5 C)  TempSrc: Oral Oral  Oral  SpO2: 90% 95% 92% 95%  Weight:  71.8 kg    Height:       Supplemental O2: Room Air SpO2: 95 % O2 Flow Rate (L/min): 0 L/min FiO2 (%): 28 %   Physical Exam:   General: Well appearing, Not in acute distress. Cardio: Normal rate and rhythm Pulmonary: Moderate wheezes and rhonchi heard through lung field bilaterally Abdominal: Soft, nontender, nondistended. Normal bowel sounds.   Filed Weights   09/25/22 1306 09/28/22 0643  Weight: 77.2 kg 71.8 kg    Intake/Output Summary (Last 24 hours) at 09/28/2022 1019 Last data filed at 09/27/2022 2040 Gross per 24 hour  Intake 480 ml  Output 1 ml  Net 479 ml   Net IO Since Admission: 2,630.92 mL [09/28/22 1019]  Recent Labs    09/27/22 1707 09/27/22 2147 09/28/22 0749  GLUCAP 368* 211* 308*     Pertinent Labs:    Latest Ref Rng & Units 09/28/2022    7:34 AM 09/27/2022    7:30 AM 09/26/2022    7:52 PM  CBC  WBC 4.0 - 10.5 K/uL 13.2  11.3    Hemoglobin 12.0 - 15.0 g/dL 9.4  9.1  8.8   Hematocrit 36.0 - 46.0 % 33.0  31.3  30.2   Platelets 150 - 400 K/uL 265  227         Latest Ref Rng & Units 09/27/2022    7:30 AM 09/26/2022    3:02 AM 09/25/2022    9:50 AM  CMP  Glucose 70 - 99 mg/dL 161  096  045   BUN 8 - 23 mg/dL 19  14  15    Creatinine 0.44 - 1.00 mg/dL 4.09  8.11  9.14   Sodium 135 - 145 mmol/L 136  134  138   Potassium 3.5 - 5.1 mmol/L 3.8  4.4  3.9   Chloride 98 - 111 mmol/L 99  103  105   CO2 22 - 32 mmol/L 27  23  24    Calcium 8.9 - 10.3 mg/dL 9.5   8.9  8.8   Total Protein 6.5 - 8.1 g/dL   6.3   Total Bilirubin 0.3 - 1.2 mg/dL   0.4   Alkaline Phos 38 - 126 U/L   54   AST 15 - 41 U/L   9   ALT 0 - 44 U/L   10     Imaging: No results found.  Assessment/Plan:   Active Problems:   Hypertension   Type 2 diabetes mellitus with other specified complication (HCC)   Constipation   Tobacco use disorder   Acute on chronic anemia   COPD exacerbation (HCC)   Acute on chronic blood loss anemia   Longstanding persistent atrial fibrillation (HCC)   Symptomatic anemia   Patient Summary: [name] is a 72 year old with PMH of iron deficiency anemia, afib and bioprosthetic mitral valve replacement on warfarin, hx of CVA, history  of renal cell carcinoma 2013 in remission, diabetes, COPD, sleep apnea, chronic pain, PAD who presents with 3 weeks of worsening weakness and shortness of breath and admitted for symptomatic anemia.   Symptomatic IDA  She is S/p 2 units of red blood cell transfusion yesterday, 8/16.  Hb currently stable at 9.4.  She also received 500 mg of IV iron on 8/15.  Unclear the the etiology of patient's recurrent symptomatic anemia. FOBT negative. She had colonoscopy in 2022 that was normal.  EGD was also normal. Capsule endoscopy completed in 2017 which showed small AVM but non-bleeding. Patient was seen by PT, and is recommending SNF. -Trend CBC -Dispo to SNF on 8/19.   COPD exacerbation  Stable. Moderate amoutn of wheezes and rhonchi heard bilaterally, will give breathing treatment.  -Prednisone 40 mg (#4/5)until 08/20 -Azithromycin 500 mg(#2/3) -nicotine patch 14g - Duoneb treatment once  3. Atrial fibrillation History of mitral valve replacement 2/2 mitral stenosis Patient's warfarin was started yesterday 8/16, INR is subtherapeutic, 1.9.  Patient's INR goal is 2.0-2.5. -Continue metoprolol 25 mg -Continue warfarin 2.5 mg.   4. Chronic constipation  Pt did have a normal bowel movement yesterday, and states  she is feeling better after it.  -linzess. -MiraLAX twice daily -Senna  5. Diabetes Blood glucose elevated 254-350 -Restarted 44 units Semglee. -SSI  Diet: Regular diet IVF: PO intake VTE: SCDs Start: 09/25/22 1243 Code: Full PT/OT: SNF for Subacute PT ID:  Anti-infectives (From admission, onward)    Start     Dose/Rate Route Frequency Ordered Stop   09/25/22 1400  azithromycin (ZITHROMAX) tablet 500 mg        500 mg Oral Daily 09/25/22 1307 09/27/22 0911        Anticipated discharge to Skilled nursing facility    .  Olegario Messier, MD Internal Medicine Resident, PGY-2 [facility]  Pager: [phone] 10:19 AM, 09/28/2022   **Please contact the on call pager after 5 pm and on weekends at [phone].**

## 2022-09-29 ENCOUNTER — Ambulatory Visit: Payer: Self-pay | Admitting: *Deleted

## 2022-09-29 ENCOUNTER — Other Ambulatory Visit (HOSPITAL_COMMUNITY): Payer: Self-pay

## 2022-09-29 DIAGNOSIS — I4811 Longstanding persistent atrial fibrillation: Secondary | ICD-10-CM

## 2022-09-29 DIAGNOSIS — D649 Anemia, unspecified: Secondary | ICD-10-CM | POA: Diagnosis not present

## 2022-09-29 DIAGNOSIS — I10 Essential (primary) hypertension: Secondary | ICD-10-CM | POA: Diagnosis not present

## 2022-09-29 LAB — GLUCOSE, CAPILLARY
Glucose-Capillary: 179 mg/dL — ABNORMAL HIGH (ref 70–99)
Glucose-Capillary: 155 mg/dL — ABNORMAL HIGH (ref 70–99)

## 2022-09-29 LAB — BASIC METABOLIC PANEL
Anion gap: 10 (ref 5–15)
BUN: 23 mg/dL (ref 8–23)
CO2: 28 mmol/L (ref 22–32)
Calcium: 9.5 mg/dL (ref 8.9–10.3)
Chloride: 98 mmol/L (ref 98–111)
Creatinine, Ser: 0.89 mg/dL (ref 0.44–1.00)
GFR, Estimated: >60 mL/min (ref >60)
Glucose, Bld: 116 mg/dL — ABNORMAL HIGH (ref 70–99)
Potassium: 3.7 mmol/L (ref 3.5–5.1)
Sodium: 136 mmol/L (ref 135–145)

## 2022-09-29 LAB — CBC
HCT: 34.1 % — ABNORMAL LOW (ref 36.0–46.0)
Hemoglobin: 9.7 g/dL — ABNORMAL LOW (ref 12.0–15.0)
MCH: 21.9 pg — ABNORMAL LOW (ref 26.0–34.0)
MCHC: 28.4 g/dL — ABNORMAL LOW (ref 30.0–36.0)
MCV: 77.1 fL — ABNORMAL LOW (ref 80.0–100.0)
Platelets: 245 10*3/uL (ref 150–400)
RBC: 4.42 MIL/uL (ref 3.87–5.11)
RDW: 21.1 % — ABNORMAL HIGH (ref 11.5–15.5)
WBC: 11.8 10*3/uL — ABNORMAL HIGH (ref 4.0–10.5)
nRBC: 0.8 % — ABNORMAL HIGH (ref 0.0–0.2)

## 2022-09-29 LAB — PROTIME-INR
INR: 1.3 — ABNORMAL HIGH (ref 0.8–1.2)
Prothrombin Time: 16.7 s — ABNORMAL HIGH (ref 11.4–15.2)

## 2022-09-29 MED ORDER — WARFARIN SODIUM 5 MG PO TABS
5.0000 mg | ORAL_TABLET | Freq: Once | ORAL | Status: AC
  Administered 2022-09-29: 5 mg via ORAL
  Filled 2022-09-29 (×3): qty 1

## 2022-09-29 MED ORDER — IPRATROPIUM-ALBUTEROL 0.5-2.5 (3) MG/3ML IN SOLN
3.0000 mL | Freq: Once | RESPIRATORY_TRACT | Status: AC
  Administered 2022-09-29: 3 mL via RESPIRATORY_TRACT
  Filled 2022-09-29: qty 3

## 2022-09-29 NOTE — Progress Notes (Signed)
Physical Therapy Treatment Patient Details Name: [name] MRN: [id number] DOB: 1950/10/11 Today's Date: 09/29/2022   History of Present Illness Pt is a 72 year old female admitted on 09/25/22 with weakness and SOB over the last 3 weeks. PMH of iron deficiency anemia, afib and bioprosthetic mitral valve replacement on warfarin, hx of CVA, history of renal cell carcinoma 2013 in remission, diabetes, COPD, sleep apnea, chronic pain, PAD.    PT Comments  Pt tolerated treatment well today. Pt was able to progress ambulation in the hallway with no LOB noted. Pt declined SNF and is requesting HHPT now. Pt mobility today adequate for HHPT therefore DC recs have been changed. Pt expects to be DC home today.   If plan is discharge home, recommend the following: A lot of help with walking and/or transfers;A little help with bathing/dressing/bathroom;Assistance with cooking/housework;Assist for transportation;Help with stairs or ramp for entrance   Can travel by private vehicle     No  Equipment Recommendations  None recommended by PT (Pt has needed DME)    Recommendations for Other Services       Precautions / Restrictions Precautions Precautions: Fall Restrictions Weight Bearing Restrictions: No     Mobility  Bed Mobility               General bed mobility comments: Seated EOB    Transfers Overall transfer level: Needs assistance Equipment used: None Transfers: Sit to/from Stand Sit to Stand: Supervision           General transfer comment: Pt stood up without RW and walked over to grab RW impulsively.    Ambulation/Gait Ambulation/Gait assistance: Supervision Gait Distance (Feet): 200 Feet Assistive device: Rolling walker (2 wheels) Gait Pattern/deviations: Drifts right/left, Decreased stride length, Step-through pattern Gait velocity: Decreased     General Gait Details: no LOB noted today. Cues for proximity to RW.   Stairs              Wheelchair Mobility     Tilt Bed    Modified Rankin (Stroke Patients Only)       Balance Overall balance assessment: Needs assistance Sitting-balance support: Bilateral upper extremity supported Sitting balance-Leahy Scale: Good     Standing balance support: Bilateral upper extremity supported Standing balance-Leahy Scale: Fair Standing balance comment: Reliant on RW mostly however able to take steps without it.                            Cognition Arousal: Alert Behavior During Therapy: WFL for tasks assessed/performed, Impulsive Overall Cognitive Status: Within Functional Limits for tasks assessed                                 General Comments: Pt very pleasant and has good sense of humor. Slightly impulsive standing multiple times without command.        Exercises      General Comments General comments (skin integrity, edema, etc.): VSS      Pertinent Vitals/Pain Pain Assessment Pain Assessment: No/denies pain    Home Living                          Prior Function            PT Goals (current goals can now be found in the care plan section) Progress towards PT goals: Progressing toward goals  Frequency    Min 1X/week      PT Plan      Co-evaluation              AM-PAC PT "6 Clicks" Mobility   Outcome Measure                   End of Session Equipment Utilized During Treatment: Gait belt Activity Tolerance: Patient tolerated treatment well Patient left: in bed;with call bell/phone within reach Nurse Communication: Mobility status PT Visit Diagnosis: Other abnormalities of gait and mobility (R26.89)     Time: 1211-1223 PT Time Calculation (min) (ACUTE ONLY): 12 min  Charges:    $Gait Training: 8-22 mins PT General Charges $$ ACUTE PT VISIT: 1 Visit                     Shela Nevin, PT, DPT Acute Rehab Services [phone]    [name] 09/29/2022, 2:39 PM

## 2022-09-29 NOTE — Discharge Instructions (Signed)
It was a pleasure taking care of you.  You were admitted due to low hemoglobin, and while hospitalized you were given blood.  Please follow-up with your PCP.

## 2022-09-29 NOTE — Discharge Summary (Signed)
Name: [name] MRN: [id number] DOB: 01/16/1951 72 y.o. PCP: Miguel Aschoff, MD  Date of Admission: 09/25/2022 12:03 PM Date of Discharge: 09/29/22 Attending Physician: Dr.  Johny Sax  Discharge Diagnosis: Active Problems:   Essential hypertension   Type 2 diabetes mellitus with other specified complication (HCC)   Constipation   Tobacco use disorder   Acute on chronic anemia   COPD exacerbation (HCC)   Acute on chronic blood loss anemia   Longstanding persistent atrial fibrillation (HCC)   Symptomatic anemia    Discharge Medications: Allergies as of 09/29/2022       Reactions   Dexilant [dexlansoprazole] Other (See Comments)   Made acid reflux worse   Lorazepam Other (See Comments)   Patient's sister noted that ativan caused the patient to become extremely confused during hospitalization 09/2010; tolerates Xanax   Oxycontin [oxycodone] Other (See Comments)   headache   Tramadol Hcl Swelling   Ankle swelling        Medication List     STOP taking these medications    Nyamyc powder Generic drug: nystatin       TAKE these medications    Accu-Chek FastClix Lancet Kit Check your blood 4 times a day dx code 250.00 insulin requiring   albuterol 108 (90 Base) MCG/ACT inhaler Commonly known as: ProAir HFA Inhale 2 puffs into the lungs every 6 (six) hours as needed for shortness of breath.   ALPRAZolam 1 MG tablet Commonly known as: XANAX Take 1/2 - 1 tablet (1/2  - 1 mg total) by mouth at bedtime as needed for anxiety or sleep. May take 1/2 tablet during the day if needed for anxiety   buPROPion 150 MG 24 hr tablet Commonly known as: Wellbutrin XL Take 1 tablet (150 mg total) by mouth every morning.   cetirizine 10 MG tablet Commonly known as: ZyrTEC Allergy Take 1 tablet (10 mg total) by mouth daily.   DULoxetine 60 MG capsule Commonly known as: CYMBALTA Take 1 capsule (60 mg total) by mouth 2 (two) times daily.   FreeStyle  Libre 3 Sensor Misc Place 1 sensor on the skin every 14 days. Use to check glucose 6 times daily as directed   furosemide 80 MG tablet Commonly known as: LASIX Take 1 tablet (80 mg total) by mouth every morning.   gabapentin 600 MG tablet Commonly known as: NEURONTIN Take 600 mg by mouth at bedtime.   HYDROcodone-acetaminophen 5-325 MG tablet Commonly known as: NORCO/VICODIN Take 1-2 tablets by mouth 3 (three) times daily. What changed:  how much to take when to take this reasons to take this   Insulin Pen Needle 32G X 4 MM Misc Use to inject insulin up to 4 times a day   ipratropium 0.03 % nasal spray Commonly known as: ATROVENT Instill 2 sprays into the nose 3 (three) times daily as directed   ipratropium-albuterol 0.5-2.5 (3) MG/3ML Soln Commonly known as: DUONEB Inhale 3 mLs by nebulization every 6 (six) hours as needed (shortness of breath, wheezing).   ketoconazole 2 % cream Commonly known as: NIZORAL Apply 1 application topically at bedtime as needed for irritation.   lidocaine 5 % Commonly known as: Lidoderm Place 1 or 2 patches to painful area of back each day.  Remove & Discard patch within 12 hours or as directed by MD   lidocaine-prilocaine cream Commonly known as: EMLA Apply 1 Application topically as needed.   Linzess 72 MCG capsule Generic drug: linaclotide Take 1 capsule (72  mcg total) by mouth daily before breakfast.   metoprolol succinate 25 MG 24 hr tablet Commonly known as: Toprol XL Take 1 tablet (25 mg total) by mouth daily.   ondansetron 4 MG tablet Commonly known as: Zofran Take 1 tablet (4 mg total) by mouth every 8 (eight) hours as needed for nausea or vomiting.   OneTouch Verio Reflect w/Device Kit Use to check blood sugar 1 time a day   OXYGEN Inhale 2 L into the lungs at bedtime.   Ozempic (1 MG/DOSE) 4 MG/3ML Sopn Generic drug: Semaglutide (1 MG/DOSE) Inject 1 mg into the skin once a week.   potassium chloride SA 20 MEQ  tablet Commonly known as: KLOR-CON M Take 1 tablet (20 mEq total) by mouth daily.   rosuvastatin 20 MG tablet Commonly known as: CRESTOR Take 1 tablet (20 mg total) by mouth at bedtime.   Stool Softener/Laxative 50-8.6 MG tablet Generic drug: senna-docusate Take 1 to 2 tablets by mouth once or twice a day as needed for constipation   Trelegy Ellipta 100-62.5-25 MCG/ACT Aepb Generic drug: Fluticasone-Umeclidin-Vilant Inhale 1 puff into the lungs daily.   Evaristo Bury FlexTouch 100 UNIT/ML FlexTouch Pen Generic drug: insulin degludec Inject 44 Units into the skin daily.   warfarin 2.5 MG tablet Commonly known as: COUMADIN Take 1 tablet by mouth daily unless otherwise instructed.        Disposition and follow-up:   [name] was discharged from [facility] in [place] condition.  At the hospital follow up visit please address:  1.  Follow-up:  a.  Symptomatic anemia -Hb.  On day of discharge hemoglobin was stable at 9.7.  -Hematology referral  2. INR.  -Day of discharge, INR 1.3 (INR goal 2-2.5) She received a one-time dose of warfarin 5 mg.        2.  Labs / imaging needed at time of follow-up: CBC, PT and INR    Follow-up Appointments:  Follow-up Information     [facility] Follow up.   Specialty: Home Health Services Why: [facility] Contact information: [address] [place] [address] [phone]                 Hospital Course by problem list: Symptomatic IDA On presentation, hemoglobin 6.4. We held warfarin due to the acute Hb drop. She received 500 mg of IV iron, 2 units of packed red blood cells.  FOBT negative.  At the time of discharge, hemoglobin stable at 9.2. -Follow up with PCP.   COPD exacerbation She has been having 3 weeks of wheezing, shortness of breath and mild productive cough.  She was prescribed a 5-day course for prednisone and azithromycin, for COPD exacerbation.  Atrial  fibrillation History of mitral valve replacement 2/2 mitral stenosis Warfarin was held on presentation. INR was subtherapeutic(1.8). She  has received 4 doses of warfarin, however, INR is still subtherapeutic. Patient received an additional 5 mg of warfarin, and asked to follow-up with our outpatient pharmacist next week. -Continue warfarin 2.5mg  -Continue metoprolol 25mg  XL    Chronic constipation Stable.  We continued senna and MiraLAX as needed.  Diabetes Stable.SSI   Nocturnal hypoxia OSA Pulmonary hypertension -CPAP at bedtime with bleed-in O2    HFpEF Stable.  We continued furosemide 80 mg and potassium repletion.     Discharge Subjective: Patient evaluated at bedside this AM.  Discharge Exam:   BP (!) 179/62 (BP Location: Right Arm)   Pulse 80   Temp 97.6 F (  36.4 C) (Oral)   Resp 18   Ht 5\' 2"  (1.575 m)   Wt 72.1 kg   SpO2 95%   BMI 29.06 kg/m    Constitutional: Talkative, not in acute distress.   Cardiovascular: regular rate and rhythm, no m/r/g Pulmonary/Chest: Normal work of breathing.  Diffuse wheezing in bilateral lungs.   Abdominal: soft, non-tender, non-distended   Pertinent Labs, Studies, and Procedures:     Latest Ref Rng & Units 09/29/2022    7:16 AM 09/28/2022    7:34 AM 09/27/2022    7:30 AM  CBC  WBC 4.0 - 10.5 K/uL 11.8  13.2  11.3   Hemoglobin 12.0 - 15.0 g/dL 9.7  9.4  9.1   Hematocrit 36.0 - 46.0 % 34.1  33.0  31.3   Platelets 150 - 400 K/uL 245  265  227        Latest Ref Rng & Units 09/29/2022    7:16 AM 09/27/2022    7:30 AM 09/26/2022    3:02 AM  CMP  Glucose 70 - 99 mg/dL 629  528  413   BUN 8 - 23 mg/dL 23  19  14    Creatinine 0.44 - 1.00 mg/dL 2.44  0.10  2.72   Sodium 135 - 145 mmol/L 136  136  134   Potassium 3.5 - 5.1 mmol/L 3.7  3.8  4.4   Chloride 98 - 111 mmol/L 98  99  103   CO2 22 - 32 mmol/L 28  27  23    Calcium 8.9 - 10.3 mg/dL 9.5  9.5  8.9     No results found.   Discharge  Instructions:   Signed: Laretta Bolster, MD 09/29/2022, 11:13 AM   Pager: [phone]

## 2022-09-29 NOTE — Progress Notes (Signed)
ANTICOAGULATION CONSULT NOTE - Initial Consult  Pharmacy Consult for warfarin dosing at discharge (today, 19-AUG-24) Indication: afib and bioprosthetic mitral valve replacement on warfarin, hx of CVA.   I have historically provided telephonic dosing management for this home-bound patient who uses a patient-self testing, point of care, finger stick device with which she tests every Monday and calls me her results and which form the basis of the clinical decision to adjust her warfarin dosage.   Allergies  Allergen Reactions   Dexilant [Dexlansoprazole] Other (See Comments)    Made acid reflux worse   Lorazepam Other (See Comments)    Patient's sister noted that ativan caused the patient to become extremely confused during hospitalization 09/2010; tolerates Xanax   Oxycontin [Oxycodone] Other (See Comments)    headache   Tramadol Hcl Swelling    Ankle swelling    Vital Signs: Temp: 97.6 F (36.4 C) (08/19 0830) Temp Source: Oral (08/19 0830) BP: 179/62 (08/19 0830) Pulse Rate: 80 (08/19 0830)  Labs: Recent Labs    09/27/22 0403 09/27/22 0730 09/28/22 0734 09/29/22 0716  HGB  --  9.1* 9.4* 9.7*  HCT  --  31.3* 33.0* 34.1*  PLT  --  227 265 245  LABPROT 21.7*  --  19.9* 16.7*  INR 1.9*  --  1.7* 1.3*  CREATININE  --  1.05*  --  0.89    Estimated Creatinine Clearance: 52.3 mL/min (by C-G formula based on SCr of 0.89 mg/dL).   Medical History: Past Medical History:  Diagnosis Date   Abnormality of lung on CXR 02/02/2020   Nonspecific finding on CXR ordered by pulmonologist - c/w inflammation vs infection, f/u imaging suggested, Dr. Roxy Cedar office has been in communication about recommended next steps.   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   Non-bleeding when visualized on capsule endoscopy 06/30/2015    Arthritis    "lower back; hands"  (02/19/2018)   Asthma    Asymptomatic cholelithiasis 09/25/2015   Seen on CT scan 08/2015   Carotid artery stenosis; s/p R endarterectomy    s/p right endarterectomy (06/2010) Carotid US (07/2010):  Left: Moderate-to-severe (60-79%) calcific and non-calcific plaque origin and proximal ICA and ECA    Chronic congestive heart failure with left ventricular diastolic dysfunction (HCC) 10/21/2010   Chronic constipation 02/03/2011   Chronic daily headache 01/16/2014   Chronic iron deficiency anemia    Chronic low back pain 10/06/2012   Chronic venous insufficiency 08/04/2012   Closed fracture of one rib of left side 08/23/2021   ED visit after a fall 08/16/21    COPD (chronic obstructive pulmonary disease) with emphysema (HCC)    PFTs 2018: severe obstructive disease, insignif response to bronchiodilator, mild restriction parenchymal pattern, moderately severe diffusion defect. 2014  FEV1 0.92 (40%), ratio 69, 27% increase in FEV1 with BD, TLC 91%, severe airtrapping, DLCO49% On chronic home O2. Pulmonary rehab referral 05/2012    Dyspnea    Fibromyalgia 08/29/2010   Gastroesophageal reflux disease    History of blood transfusion    "several times"  (02/19/2018)   History of clear cell renal cell carcinoma (HCC), in remission 07/21/2011   s/p cryoablation of left RCC in 09/2011 by Dr. Fredia Sorrow. Followed by Dr. Retta Diones  [facility]) .     History of hiatal hernia    History of mitral valve replacement with bioprosthetic valve due to mitral stenosis 2012  s/p MVR with a 27-mm pericardial porcine valve (Medtronic Mosaic valve, serial [id number] on 09/20/10, Dr. Donata Clay)    History of obstructive sleep apnea, resolved 2013   resolved per sleep study 07/2019; no apnea, but did have desaturation.  CPAP no longer necessary.  Nocturnal polysomnography (06/2009): Moderate sleep apnea/ hypopnea syndrome , AHI 17.8 per hour with nonpositional hypopneas. CPAP titration to 12 CWP, AHI 2.4 per hour. On  nocturnal CPAP via a small resMed Quattro full-face mask with heated humidifier.    History of pneumonia    "once"  (02/19/2018)   History of seborrheic keratosis 09/28/2015   History of stroke without residual deficits 01/17/2022   Hyperlipidemia LDL goal < 100 11/20/2005   Internal and external hemorrhoids without complication 08/04/2012   Lesion of left native kidney 06/01/2020   Incidental finding on recent screening chest CT for lung ca ordered by Dr. Maple Hudson pulmonology - he has ordered a f/u renal U/S.    Lichen sclerosus of female genitalia 01/12/2017   Migraine    "none in years" (02/19/2018)   Mild cognitive impairment with memory loss 12/23/2021   Moderately severe major depression (HCC) 11/19/2005   Nocturnal hypoxia per sleep study 07/2019    Osteoporosis    DEXA 2016: T -2.7; DEXA (12/09/2011): L-spine T -3.7, left hip T -1.4 DEXA (12/2004): L-spine T -2.6, left hip -0.1    Paroxysmal atrial fibrillation (HCC) 10/22/2010   s/p Left atrial maze procedure for paroxysmal atrial fibrillation on 09/20/2010 by Dr Donata Clay.  Subsequent splenic infarct, decision was made to re-anticoagulate with coumadin, likely life-long as this is the most likely cause of the splenic infarct.    Personal history of colonic polyps 05/14/2011   Colonoscopy (05/2011): 4 mm adenomatous polyp excised endoscopically Colonoscopy (02/2002): Adenomatous polyp excised endoscopically    Personal history of renal cell carcinoma 09/12/2020   Pneumonia    Pulmonary hypertension due to chronic obstructive pulmonary disease (HCC) 04/25/2016   2014 TEE w PA peak pressure 46 mmHg, s/p MV replacement    Right nephrolithiasis, asymptomatic, incidental finding 09/06/2014   5 mm non-obstructing calculus seen on CT scan 09/05/2014    Right ventricular failure (HCC) 04/25/2016   Severe obesity (BMI 35.0-39.9) with comorbidity (HCC) 10/23/2011   Sleep apnea    Tobacco abuse 07/28/2012   Type 2 diabetes mellitus with diabetic  neuropathy (HCC)     Medications:  Scheduled:   buPROPion  150 mg Oral BH-q7a   docusate sodium  50 mg Oral Daily   DULoxetine  60 mg Oral BID   furosemide  80 mg Oral q morning   gabapentin  600 mg Oral QHS   insulin aspart  0-15 Units Subcutaneous TID WC   insulin glargine-yfgn  44 Units Subcutaneous Daily   ipratropium  1 spray Each Nare TID   linaclotide  72 mcg Oral QAC breakfast   loratadine  10 mg Oral Daily   metoprolol succinate  25 mg Oral Daily   nicotine  14 mg Transdermal Daily   polyethylene glycol  17 g Oral Daily   potassium chloride SA  20 mEq Oral Daily   rosuvastatin  20 mg Oral QHS   senna  1 tablet Oral Daily   warfarin  5 mg Oral ONCE-1600   Warfarin - Physician Dosing Inpatient   Does not apply q1600    Goal of Therapy:  INR target range as requested in the outpatient setting for this patient by her provider, Dr. Valora Piccolo  [name] is: INR target range of 2.0 - 2.5. This range is targeted with shared decision making, given the patient's remote and present history of iron deficiency anemia.    Plan:  Based upon review of what has been [place] Dosing Inpatient with daily orders and secure chat discussion with Dr. Adline Peals, we elect to give 5 mg warfarin today; 2.5 mg warfarin on Tuesday 20-AUG-24; 1.25 mg warfarin on Wednesday 21-AUG-24 and again on Thursday 22-AUG-24. Patient will perform finger-stick, point of care, patient self-testing on Friday, 23-AUG-24 and provide me results of her INR test whereupon I will resume my role as the advanced practice provder/Clinical Pharmacist Practitioner responsible for weekly dosing.   Historically, she only requires warfarin 10 milligrams - 12.5 milligrams range per week to achieve the requested INR range of 2.0 - 2.5.  I personally visited the patient at the bedside and provided her the www.doseresponse.com patient print out detailing her daily dosing between today and Thursday, with an INR determination to  be performed on Friday 23-AUG-24 and provided to me for subsequent dosing instructions, to which she verbalized her understanding of the instructions.  [name], PharmD, CPP 09/29/2022,11:12 AM

## 2022-09-29 NOTE — TOC Initial Note (Addendum)
Transition of Care [facility]) - Initial/Assessment Note    Patient Details  Name: [name] MRN: [id number] Date of Birth: 1950-11-20  Transition of Care [facility]) CM/SW Contact:    Kingsley Plan, RN Phone Number: 09/29/2022, 10:47 AM  Clinical Narrative:                  Confirmed face sheet information, including number for home health to call.   [name] can do PT/OT start date tomorrow. Will need orders and and face to face for PT/OT. Once [name] is in the home if they feel she needs more services they will add. Patient aware [name] will only be there 2 times a week for a hour at a time. She does not have anyone to stay with her, however she feels she will be fine.   Patient has walker, rollator and bedside commode.    She is does not want to go to [facility] because they have covid positive patients now.    She is aware there are other SNF's besides [facility] but does not want to go to one .  Discussed with her her prior falls at home. Patient feels she will be fine going home.  Patient aware discharge is today. Her sister will provide transportation home this afternoon.    Expected Discharge Plan: Home w Home Health Services Barriers to Discharge: No Barriers Identified   Patient Goals and CMS Choice Patient states their goals for this hospitalization and ongoing recovery are:: to return to home CMS Medicare.gov Compare Post Acute Care list provided to:: Patient Choice offered to / list presented to : Patient [place] ownership interest in [facility].provided to:: Patient    Expected Discharge Plan and Services   Discharge Planning Services: CM Consult Post Acute Care Choice: Home Health Living arrangements for the past 2 months: Single Family Home                 DME Arranged: N/A DME Agency: NA       HH Arranged: PT, OT HH Agency: [facility] Date HH Agency Contacted: 09/29/22 Time HH Agency Contacted: 1046 Representative spoke with  at [facility] Agency: [name]  Prior Living Arrangements/Services Living arrangements for the past 2 months: Single Family Home Lives with:: Self Patient language and need for interpreter reviewed:: Yes Do you feel safe going back to the place where you live?: Yes      Need for Family Participation in Patient Care: Yes (Comment) Care giver support system in place?: No (comment) (see note) Current home services: DME Criminal Activity/Legal Involvement Pertinent to Current Situation/Hospitalization: No - Comment as needed  Activities of Daily Living Home Assistive Devices/Equipment: None ADL Screening (condition at time of admission) Patient's cognitive ability adequate to safely complete daily activities?: Yes Is the patient deaf or have difficulty hearing?: No Does the patient have difficulty seeing, even when wearing glasses/contacts?: No Does the patient have difficulty concentrating, remembering, or making decisions?: No Patient able to express need for assistance with ADLs?: Yes Does the patient have difficulty dressing or bathing?: Yes Independently performs ADLs?: No Communication: Independent Dressing (OT): Needs assistance Is this a change from baseline?: Change from baseline, expected to last >3 days Grooming: Needs assistance Is this a change from baseline?: Change from baseline, expected to last >3 days Feeding: Independent Bathing: Needs assistance Is this a change from baseline?: Change from baseline, expected to last >3 days Toileting: Needs assistance Is this a change from baseline?: Change from  baseline, expected to last >3days In/Out Bed: Needs assistance Is this a change from baseline?: Change from baseline, expected to last >3 days Walks in Home: Needs assistance Is this a change from baseline?: Change from baseline, expected to last >3 days Does the patient have difficulty walking or climbing stairs?: Yes Weakness of Legs: Both Weakness of Arms/Hands: Both  Permission  Sought/Granted Permission sought to share information with : Family Supports Permission granted to share information with : No              Emotional Assessment Appearance:: Appears older than stated age Attitude/Demeanor/Rapport: Self-Confident Affect (typically observed): Accepting Orientation: : Oriented to Self, Oriented to Place, Oriented to  Time, Oriented to Situation Alcohol / Substance Use: Not Applicable Psych Involvement: No (comment)  Admission diagnosis:  Symptomatic anemia [D64.9] Patient Active Problem List   Diagnosis Date Noted   Symptomatic anemia 09/27/2022   Longstanding persistent atrial fibrillation (HCC) 09/26/2022   Acute on chronic blood loss anemia 09/25/2022   Generalized pain 08/01/2022   Multifactorial functional impairment 02/13/2022   Vertigo 01/17/2022   History of stroke without residual deficits 01/17/2022   Mild cognitive impairment with memory loss 12/23/2021   Diabetic peripheral neuropathy associated with type 2 diabetes mellitus (HCC) 11/11/2021   Recurrent falls 11/11/2021   Gait instability 09/19/2021   Perennial non-allergic rhinitis 09/19/2021   Chronic, continuous use of opioids for chronic pain 07/10/2021   Polypharmacy 11/08/2020   Personal history of renal cell carcinoma 09/12/2020   Tinea pedis of both feet 08/07/2020   Nocturnal hypoxia per sleep study 07/2019    Chronic iron deficiency anemia    Carotid artery stenosis; s/p R endarterectomy    COPD mixed type (HCC)    Arthritis of both hands 07/01/2019   Pain due to onychomycosis of toenails of both feet 05/27/2019   Stage 3a chronic kidney disease (CKD) (HCC) 12/16/2018   COPD exacerbation (HCC) 11/13/2017   Lichen sclerosus of female genitalia 01/12/2017   Mixed stress and urge urinary incontinence 05/27/2016   Pulmonary hypertension due to chronic obstructive pulmonary disease (HCC) 04/25/2016   Asymptomatic cholelithiasis 09/25/2015   Acute on chronic anemia  12/26/2014   Aortic atherosclerosis (HCC) 10/19/2014   Right nephrolithiasis, asymptomatic (incidental finding) 09/06/2014   Pain of great toes medial nail beds of both feet 05/19/2014   Chronic low back pain 10/06/2012   Long term current use of anticoagulant therapy 09/30/2012   Recurrent candidal intertrigo 07/28/2012   Tobacco use disorder 07/28/2012   Obesity (BMI 30.0-34.9) 10/23/2011   Personal history of colonic polyps 05/14/2011   Sleep disturbance 05/14/2011   Abdominal wall hernia 05/14/2011   Constipation 02/03/2011   Health care maintenance 11/04/2010   PAF (paroxysmal atrial fibrillation) (HCC) 10/22/2010   Chronic congestive heart failure with left ventricular diastolic dysfunction (HCC) 10/21/2010   Fibromyalgia 08/29/2010   Gastroesophageal reflux disease    Type 2 diabetes mellitus with other specified complication (HCC)    Osteoporosis    Peripheral arterial disease (HCC) 05/27/2010   History of mitral valve replacement with bioprosthetic valve 2012   Hypertension 06/19/2008   Hyperlipidemia 11/20/2005   Severe major depression (HCC) 11/19/2005   PCP:  Miguel Aschoff, MD Pharmacy:   [facility] [address] [address] Phone: [phone] Fax: [phone]  [id number] - [place], [place] - [address] [place]  [place] [address] Phone: [phone] Fax: [phone]  [facility] [address] [place] [address] Phone: [phone] Fax: [phone]     Social Determinants of Health (SDOH) Social History: SDOH Screenings   Food Insecurity: No Food Insecurity (09/25/2022)  Housing: Patient Declined (09/25/2022)  Transportation Needs: No Transportation Needs (09/25/2022)  Utilities: Not At Risk (09/25/2022)  Alcohol Screen: Low Risk  (05/01/2022)  Depression (PHQ2-9): High Risk (07/01/2022)  Financial  Resource Strain: Low Risk  (05/01/2022)  Physical Activity: Inactive (05/01/2022)  Social Connections: Socially Isolated (05/01/2022)  Stress: Stress Concern Present (05/01/2022)  Tobacco Use: High Risk (05/01/2022)   SDOH Interventions:     Readmission Risk Interventions     No data to display

## 2022-09-29 NOTE — Patient Outreach (Signed)
  Care Coordination   Follow Up Visit Note   09/29/2022 Name: Anaisabel Callies MRN: [id number] DOB: [birth year]  Phill Myron Torsiello is a 72 y.o. year old female who sees [name], [name], MD for primary care. I spoke with  [name] by phone today.  What matters to the patients health and wellness today?  Being released to home today- will have HH after declining SNF rehab.     Goals Addressed   None     SDOH assessments and interventions completed:  Yes     Care Coordination Interventions:  Yes, provided  Interventions Today    Flowsheet Row Most Recent Value  General Interventions   General Interventions Discussed/Reviewed Level of Care  Level of Care --  [Pt being released to home today from hospital- recommendation was for SNF but "COVID outbreak" at the one she wanted to go to.]       Follow up plan: Follow up call scheduled for 09/30/22    Encounter Outcome:  Pt. Visit Completed

## 2022-09-29 NOTE — Progress Notes (Signed)
[name] to be D/C'd Home per MD order. Discussed with the patient and all questions fully answered.    IV catheter discontinued intact. Site without signs and symptoms of complications. Dressing and pressure applied.  An After Visit Summary was printed and given to the patient.  Patient escorted via WC, and D/C home via private auto.  Kai Levins  09/29/2022 2:15 PM

## 2022-09-29 NOTE — Plan of Care (Signed)

## 2022-09-30 ENCOUNTER — Telehealth: Payer: Self-pay

## 2022-09-30 ENCOUNTER — Ambulatory Visit: Payer: Self-pay | Admitting: *Deleted

## 2022-09-30 ENCOUNTER — Other Ambulatory Visit: Payer: Self-pay

## 2022-09-30 NOTE — Patient Outreach (Signed)
  Care Coordination   Follow Up Visit Note   09/30/2022 Name: [name] MRN: [id number] DOB: [birth year]  [name] is a 72 y.o. year old female who sees [name], [name], MD for primary care. I spoke with  [name] by phone today.  What matters to the patients health and wellness today?  Released from hospital to home on yesterday (s/p anemia)     Goals Addressed             This Visit's Progress    Provide support, resources and mental health support       Activities and task to complete in order to accomplish goals.    Glad you are home from hospital  Pulmonologist and appointment set (10/21/22) Continue working with the [facility] team on plans to move forward with home repairs/adaptations to be made- make CHS aware of questions/needs Call [phone] anytime 24/7 for mental health crisis support Continue with positive self talk Try listening to music/tv, coloring or other activities to help distract you from feelings of worry, sadness, etc. Call insurance Concierge and inquire about copay for counseling- I am happy to help connect you if agreeable- PCP to complete revised SCAT application for hopeful approval of door to door service and not curbside- I will pickup and fax to [employer]  Consider participating in activities outside the home- [facility],etc Follow up with [employer] program for in-home help/possibly rides too- maybe they can help with the cleaning of walls(smoke) Work on the decluttering goal we discussed- 1bag/box on street for bulk pickup trash day Continue to be productive  Follow up visits planned for mammorgram, ophthalmologist, pulmonary and vascular-          SDOH assessments and interventions completed:  Yes     Care Coordination Interventions:  Yes, provided  Interventions Today    Flowsheet Row Most Recent Value  Chronic Disease   Chronic disease during today's visit Diabetes   General Interventions   General Interventions Discussed/Reviewed Level of Care, Community Resources  Exercise Interventions   Exercise Discussed/Reviewed Exercise Discussed  [Anticipating HHPT to begin post hospitalization (Pt declined SNF rehab bc of COVID outbreak at SNF's)]  Mental Health Interventions   Mental Health Discussed/Reviewed Mental Health Discussed, Coping Strategies  Nutrition Interventions   Nutrition Discussed/Reviewed Nutrition Discussed  [Reminded pt she has a benefit through [employer] for meals after hospital stay- call to request if desired]  Pharmacy Interventions   Pharmacy Dicussed/Reviewed Pharmacy Topics Discussed  [Pt reports no RX changes or concerns]  Safety Interventions   Safety Discussed/Reviewed Safety Discussed       Follow up plan: Follow up call scheduled for 10/06/22    Encounter Outcome:  Pt. Visit Completed

## 2022-09-30 NOTE — Patient Instructions (Signed)
Visit Information  Thank you for taking time to visit with me today. Please don't hesitate to contact me if I can be of assistance to you.   Following are the goals we discussed today:   Goals Addressed             This Visit's Progress    Provide support, resources and mental health support       Activities and task to complete in order to accomplish goals.    Glad you are home from hospital  Pulmonologist and appointment set (10/21/22) Continue working with the [facility] team on plans to move forward with home repairs/adaptations to be made- make CHS aware of questions/needs Call [phone] anytime 24/7 for mental health crisis support Continue with positive self talk Try listening to music/tv, coloring or other activities to help distract you from feelings of worry, sadness, etc. Call insurance Concierge and inquire about copay for counseling- I am happy to help connect you if agreeable- PCP to complete revised SCAT application for hopeful approval of door to door service and not curbside- I will pickup and fax to [employer]  Consider participating in activities outside the home- [facility],etc Follow up with [employer] program for in-home help/possibly rides too- maybe they can help with the cleaning of walls(smoke) Work on the decluttering goal we discussed- 1bag/box on street for bulk pickup trash day Continue to be productive  Follow up visits planned for [facility], ophthalmologist, pulmonary and vascular-          Our next appointment is by telephone on 10/06/22   Please call the care guide team at [phone] if you need to cancel or reschedule your appointment.   If you are experiencing a Mental Health or Behavioral Health Crisis or need someone to talk to, please call the Suicide and Crisis Lifeline: 988 call 911   The patient verbalized understanding of instructions, educational materials, and care plan provided today and DECLINED offer to receive  copy of patient instructions, educational materials, and care plan.   Telephone follow up appointment with care management team member scheduled for:10/06/22  Reece Levy, MSW, LCSW Clinical Social Worker  [phone]

## 2022-09-30 NOTE — Transitions of Care (Post Inpatient/ED Visit) (Signed)
09/30/2022  Name: [name] MRN: [id number] DOB: 05-19-1950  Today's TOC FU Call Status: Today's TOC FU Call Status:: Successful TOC FU Call Completed TOC FU Call Complete Date: 09/30/22  Transition Care Management Follow-up Telephone Call Date of Discharge: 09/29/22 Discharge Facility: [facility]) Type of Discharge: Inpatient Admission Primary Inpatient Discharge Diagnosis:: Essential Hypertension How have you been since you were released from the hospital?: Better Any questions or concerns?: Yes Patient Questions/Concerns:: Patient was suppose to DC to [facility] and she ended up not going as they have a Covid-19 outbreak  Items Reviewed: Did you receive and understand the discharge instructions provided?: Yes Medications obtained,verified, and reconciled?: Yes (Medications Reviewed) Any new allergies since your discharge?: No Dietary orders reviewed?: No Do you have support at home?: No (Sister Meriam Sprague can take to appointments)  Medications Reviewed Today: Medications Reviewed Today     Reviewed by Jodelle Gross, RN (Case Manager) on 09/30/22 at 1408  Med List Status: <None>   Medication Order Taking? Sig Documenting Provider Last Dose Status Informant  albuterol (PROAIR HFA) 108 (90 Base) MCG/ACT inhaler [id number] Yes Inhale 2 puffs into the lungs every 6 (six) hours as needed for shortness of breath. Miguel Aschoff, MD Taking Active Self  ALPRAZolam Prudy Feeler) 1 MG tablet [id number] Yes Take 1/2 - 1 tablet (1/2  - 1 mg total) by mouth at bedtime as needed for anxiety or sleep. May take 1/2 tablet during the day if needed for anxiety Miguel Aschoff, MD Taking Active Self  Blood Glucose Monitoring Suppl [facility] VERIO REFLECT) w/Device KIT [id number] Yes Use to check blood sugar 1 time a day Earl Lagos, MD Taking Active Self  buPROPion (WELLBUTRIN XL) 150 MG 24 hr tablet [id number] Yes Take 1 tablet (150 mg total) by mouth every morning.  Miguel Aschoff, MD Taking Active Self  cetirizine [facility] ALLERGY) 10 MG tablet [id number] Yes Take 1 tablet (10 mg total) by mouth daily. Miguel Aschoff, MD Taking Active Self  Continuous Glucose Sensor (FREESTYLE LIBRE 3 [place]) [place] [id number] Yes Place 1 sensor on the skin every 14 days. Use to check glucose 6 times daily as directed Miguel Aschoff, MD Taking Active Self  DULoxetine (CYMBALTA) 60 MG capsule [id number] Yes Take 1 capsule (60 mg total) by mouth 2 (two) times daily. Miguel Aschoff, MD Taking Active Self  Fluticasone-Umeclidin[facility] ELLIPTA) 100-62.5-25 MCG/ACT AEPB [id number] Yes Inhale 1 puff into the lungs daily. Waymon Budge, MD Taking Active Self  furosemide (LASIX) 80 MG tablet [id number] Yes Take 1 tablet (80 mg total) by mouth every morning. Miguel Aschoff, MD Taking Active Self  gabapentin (NEURONTIN) 600 MG tablet [id number] Yes Take 600 mg by mouth at bedtime. [provider] Taking Active Self  HYDROcodone-acetaminophen (NORCO/VICODIN) 5-325 MG tablet [id number] Yes Take 1-2 tablets by mouth 3 (three) times daily.  Patient taking differently: Take 2 tablets by mouth every 6 (six) hours as needed for moderate pain.   Miguel Aschoff, MD Taking Active Self  insulin degludec [facility]) 100 UNIT/ML FlexTouch Pen [id number] Yes Inject 44 Units into the skin daily. Miguel Aschoff, MD Taking Active Self  Insulin Pen Needle 32G X 4 MM MISC [id number] Yes Use to inject insulin up to 4 times a day Rana Snare, DO Taking Active Self  ipratropium (ATROVENT) 0.03 % nasal spray [id number] Yes Instill 2 sprays into the nose 3 (three) times daily as directed Miguel Aschoff, MD Taking Active Self  ipratropium-albuterol (DUONEB) 0.5-2.5 (3) MG/3ML SOLN [id number] Yes Inhale 3 mLs by nebulization every 6 (six) hours as needed (shortness of breath, wheezing). Miguel Aschoff, MD Taking Active Self  ketoconazole  (NIZORAL) 2 % cream [id number] Yes Apply 1 application topically at bedtime as needed for irritation. [name], RPH-CPP Taking Active Self  Lancets Misc. (ACCU-CHEK FASTCLIX LANCET) KIT [id number] Yes Check your blood 4 times a day dx code 250.00 insulin requiring Doneen Poisson, MD Taking Active Self  lidocaine (LIDODERM) 5 % [id number] Yes Place 1 or 2 patches to painful area of back each day.  Remove & Discard patch within 12 hours or as directed by MD Miguel Aschoff, MD Taking Active Self  lidocaine-prilocaine (EMLA) cream [id number] No Apply 1 Application topically as needed.  Patient not taking: Reported on 09/25/2022   Lucia Estelle D, DPM Not Taking Active Self  linaclotide Karlene Einstein) 72 MCG capsule [id number] Yes Take 1 capsule (72 mcg total) by mouth daily before breakfast. Miguel Aschoff, MD Taking Active Self  metoprolol succinate (TOPROL XL) 25 MG 24 hr tablet [id number] Yes Take 1 tablet (25 mg total) by mouth daily. Adron Bene, MD Taking Active Self  ondansetron (ZOFRAN) 4 MG tablet [id number] Yes Take 1 tablet (4 mg total) by mouth every 8 (eight) hours as needed for nausea or vomiting. Miguel Aschoff, MD Taking Active Self  OXYGEN [id number] Yes Inhale 2 L into the lungs at bedtime. [provider] Taking Active Self  potassium chloride SA (KLOR-CON M) 20 MEQ tablet [id number] Yes Take 1 tablet (20 mEq total) by mouth daily. Miguel Aschoff, MD Taking Active Self  rosuvastatin (CRESTOR) 20 MG tablet [id number] Yes Take 1 tablet (20 mg total) by mouth at bedtime. Miguel Aschoff, MD Taking Active Self  Semaglutide, 1 MG/DOSE, 4 MG/3ML Namon Cirri [id number] Yes Inject 1 mg into the skin once a week. Miguel Aschoff, MD Taking Active Self           Med Note Kandis Cocking Royston Sinner Sep 25, 2022  7:47 PM) Patient usually takes on Mon.  senna-docusate (SENOKOT S) 8.6-50 MG tablet [id number] No Take 1 to 2 tablets by mouth once or twice a day as  needed for constipation  Patient not taking: Reported on 09/25/2022   Miguel Aschoff, MD Not Taking Active Self           Med Note Suezanne Cheshire   Fri Aug 23, 2021 10:52 AM)    warfarin (COUMADIN) 2.5 MG tablet [id number] Yes Take 1 tablet by mouth daily unless otherwise instructed. Miguel Aschoff, MD Taking Active Self           Med Note [facility] Royston Sinner Sep 25, 2022  7:46 PM) Patient states that yesterday she had 1 tablet.            Home Care and Equipment/Supplies: Were Home Health Services Ordered?: Yes Name of Home Health Agency:: [facility] Has Agency set up a time to come to your home?: No EMR reviewed for Home Health Orders:  (This Clinical research associate called [facility] and spoke with Vikki Ports.  Patient to be notified they will start care tomorrow.) Any new equipment or medical supplies ordered?: No  Functional Questionnaire: Do you need assistance with bathing/showering or dressing?: No Do you need assistance with meal preparation?: No Do you need assistance with eating?: No Do you have difficulty maintaining continence: No Do you need assistance with getting out  of bed/getting out of a chair/moving?: No Do you have difficulty managing or taking your medications?: No  Follow up appointments reviewed: PCP Follow-up appointment confirmed?: Yes Date of PCP follow-up appointment?: 10/09/22 Follow-up Provider: Dr. [name] Specialist [facility] Follow-up appointment confirmed?: NA Do you need transportation to your follow-up appointment?: No Do you understand care options if your condition(s) worsen?: Yes-patient verbalized understanding  SDOH Interventions Today    Flowsheet Row Most Recent Value  SDOH Interventions   Food Insecurity Interventions Intervention Not Indicated  Transportation Interventions Intervention Not Indicated      TOC Interventions Today    Flowsheet Row Most Recent Value  TOC Interventions   TOC Interventions  Discussed/Reviewed TOC Interventions Discussed, TOC Interventions Reviewed, Contacted Home Health RN/OT/PT     Jodelle Gross, RN, BSN, CCM Care Management Coordinator [place] Phone: [phone]/Fax: [phone]

## 2022-10-01 ENCOUNTER — Telehealth: Payer: Self-pay | Admitting: *Deleted

## 2022-10-01 NOTE — Telephone Encounter (Signed)
Received a call from [place] PT with [facility] to inform Dr [name] of delay. Cindee Lame stated pt refused PT today; but they will see her tomorrow.

## 2022-10-03 ENCOUNTER — Telehealth: Payer: Self-pay | Admitting: Specialist

## 2022-10-03 NOTE — Telephone Encounter (Signed)
OT completed conference call with Baldo Ash from [employer] to review WRU and patient agrees with work and will sign on Tuesday. OT discussed timeline for next OT visit and client prefers to wait for visits until home repair work has been completed.  OT will contact patient once work completed. [name], MHA, OT/L [phone]

## 2022-10-06 ENCOUNTER — Telehealth: Payer: Self-pay | Admitting: Pharmacist

## 2022-10-06 ENCOUNTER — Telehealth: Payer: Self-pay

## 2022-10-06 ENCOUNTER — Ambulatory Visit: Payer: Self-pay | Admitting: *Deleted

## 2022-10-06 DIAGNOSIS — E118 Type 2 diabetes mellitus with unspecified complications: Secondary | ICD-10-CM | POA: Diagnosis not present

## 2022-10-06 DIAGNOSIS — K5909 Other constipation: Secondary | ICD-10-CM | POA: Diagnosis not present

## 2022-10-06 DIAGNOSIS — G4733 Obstructive sleep apnea (adult) (pediatric): Secondary | ICD-10-CM | POA: Diagnosis not present

## 2022-10-06 DIAGNOSIS — D509 Iron deficiency anemia, unspecified: Secondary | ICD-10-CM | POA: Diagnosis not present

## 2022-10-06 DIAGNOSIS — C642 Malignant neoplasm of left kidney, except renal pelvis: Secondary | ICD-10-CM | POA: Diagnosis not present

## 2022-10-06 DIAGNOSIS — F17200 Nicotine dependence, unspecified, uncomplicated: Secondary | ICD-10-CM | POA: Diagnosis not present

## 2022-10-06 DIAGNOSIS — I480 Paroxysmal atrial fibrillation: Secondary | ICD-10-CM | POA: Diagnosis not present

## 2022-10-06 DIAGNOSIS — I270 Primary pulmonary hypertension: Secondary | ICD-10-CM | POA: Diagnosis not present

## 2022-10-06 DIAGNOSIS — Z7901 Long term (current) use of anticoagulants: Secondary | ICD-10-CM | POA: Diagnosis not present

## 2022-10-06 DIAGNOSIS — J441 Chronic obstructive pulmonary disease with (acute) exacerbation: Secondary | ICD-10-CM | POA: Diagnosis not present

## 2022-10-06 DIAGNOSIS — I4811 Longstanding persistent atrial fibrillation: Secondary | ICD-10-CM | POA: Diagnosis not present

## 2022-10-06 NOTE — Patient Outreach (Signed)
  Care Coordination   Follow Up Visit Note   10/06/2022 Name: [name] MRN: [id number] DOB: 10/10/1950  [name] is a 72 y.o. year old female who sees [name], Kathryn Ar, MD for primary care. I spoke with  [name] by phone today.  What matters to the patients health and wellness today?  I got a fitness watch.    Goals Addressed             This Visit's Progress    Provide support, resources and mental health support       Activities and task to complete in order to accomplish goals.    Participate with [facility] HHPT and OT Get help setting  up your new Fitness Watch  Pulmonologist and appointment set (10/21/22) Continue working with the Aging Gracefully/[facility] team on plans to move forward with home repairs/adaptations to be made- make CHS aware of questions/needs Call [phone] anytime 24/7 for mental health crisis support Continue with positive self talk Try listening to music/tv, coloring or other activities to help distract you from feelings of worry, sadness, etc. Call insurance Concierge and inquire about copay for counseling- I am happy to help connect you if agreeable- Ask PCP about SCAT application at your visit this week-for hopeful approval of door to door service and not curbside- I will pickup and fax to [employer]  Consider participating in activities outside the home- [facility],etc Follow up with [employer] program for in-home help/possibly rides too- maybe they can help with the cleaning of walls(smoke) Work on the decluttering goal we discussed- 1bag/box on street for bulk pickup trash day Continue to be productive  Follow up visits planned for mammorgram, ophthalmologist, pulmonary and vascular-          SDOH assessments and interventions completed:  Yes     Care Coordination Interventions:  Yes, provided  Interventions Today    Flowsheet Row Most Recent Value  Chronic Disease   Chronic disease during  today's visit Diabetes  General Interventions   General Interventions Discussed/Reviewed General Interventions Discussed, [facility], Doctor Visits  [Pt to see PCP later this week and will ask about the revision/paperwork for [employer]AT. Pt is anticipating "contract" being brought to her tomorrow for CHS/home modificaitons and repairs]  Doctor Visits Discussed/Reviewed Doctor Visits Discussed  Level of Care Applications  Applications Other  [SCAT application revised by PCP for "door to door" pickup requested for completion- pt will ask PCP at visit later this week]  Exercise Interventions   Exercise Discussed/Reviewed Exercise Discussed  [HHPT and OT have been arranged- encouraged pt to participate and work towards goals with them]  Education Interventions   Applications Other  [SCAT application revised by PCP for "door to door" pickup requested for completion- pt will ask PCP at visit later this week]  Mental Health Interventions   Mental Health Discussed/Reviewed Mental Health Discussed, Coping Strategies       Follow up plan: Follow up call scheduled for 10/09/22    Encounter Outcome:  Pt. Visit Completed

## 2022-10-06 NOTE — Telephone Encounter (Signed)
[facility]oe health Rn is requesting  a call back for verbal order for Physical therapy on pt .Marland Kitchen    Name : De Nurse phone [phone]

## 2022-10-06 NOTE — Patient Instructions (Signed)
Visit Information  Thank you for taking time to visit with me today. Please don't hesitate to contact me if I can be of assistance to you.   Following are the goals we discussed today:   Goals Addressed             This Visit's Progress    Provide support, resources and mental health support       Activities and task to complete in order to accomplish goals.    Participate with [facility] HHPT and OT Get help setting  up your new Fitness Watch  Pulmonologist and appointment set (10/21/22) Continue working with the Aging Gracefully/[facility] team on plans to move forward with home repairs/adaptations to be made- make CHS aware of questions/needs Call 988 anytime 24/7 for mental health crisis support Continue with positive self talk Try listening to music/tv, coloring or other activities to help distract you from feelings of worry, sadness, etc. Call insurance Concierge and inquire about copay for counseling- I am happy to help connect you if agreeable- Ask PCP about SCAT application at your visit this week-for hopeful approval of door to door service and not curbside- I will pickup and fax to [employer]  Consider participating in activities outside the home- Sr Center,etc Follow up with [employer] program for in-home help/possibly rides too- maybe they can help with the cleaning of walls(smoke) Work on the decluttering goal we discussed- 1bag/box on street for bulk pickup trash day Continue to be productive  Follow up visits planned for [facility], ophthalmologist, pulmonary and vascular-          Our next appointment is by telephone on 10/09/22   Please call the care guide team at [phone] if you need to cancel or reschedule your appointment.   If you are experiencing a Mental Health or Behavioral Health Crisis or need someone to talk to, please call the Suicide and Crisis Lifeline: 988 call 911   The patient verbalized understanding of instructions, educational  materials, and care plan provided today and DECLINED offer to receive copy of patient instructions, educational materials, and care plan.   Telephone follow up appointment with care management team member scheduled for: 10/09/22   Reece Levy, MSW, LCSW Clinical Social Worker [phone]

## 2022-10-06 NOTE — Telephone Encounter (Signed)
Patient reports results of PST FS POC INR determination = 2.10. On Friday, October 03, 2022 she was instructed to increase her dose to 2.5mg  on Friday August 23; on Saturday and Sunday, she took 1.25mg . INR today 2.1.  She was advised to take warfarin as follows:  Using her 2.5 mg strength, green-colored warfarin tablet, take 1 tablet on Monday, Tuesday and Friday; on Wednesday, Thursday, Saturday and Sunday, take only 1/2 tablet. Repeat INR on 2-SEP-24.

## 2022-10-06 NOTE — Telephone Encounter (Signed)
RTC to [place], PT [facility]. Requesting VO for PT 2 times a week for 3 weeks and then 1 time a week for 1 week.  Need for Gait Training, Strengthening, Balance, and Endurance .  Verbal OK given will forward to PCP for agreement  of.  Drug interaction noted between Warfarin  and Duloxetine.   Patient has c/o pain 8 out of 10 all over.

## 2022-10-07 ENCOUNTER — Other Ambulatory Visit: Payer: Self-pay

## 2022-10-07 IMAGING — DX DG KNEE COMPLETE 4+V*R*
4 series · 4 of 4 positions shown · non-contrast
Comparison: None.

CLINICAL DATA: Recent fall, acute right knee pain.

EXAM:
RIGHT KNEE - COMPLETE 4+ VIEW

[knee ap]
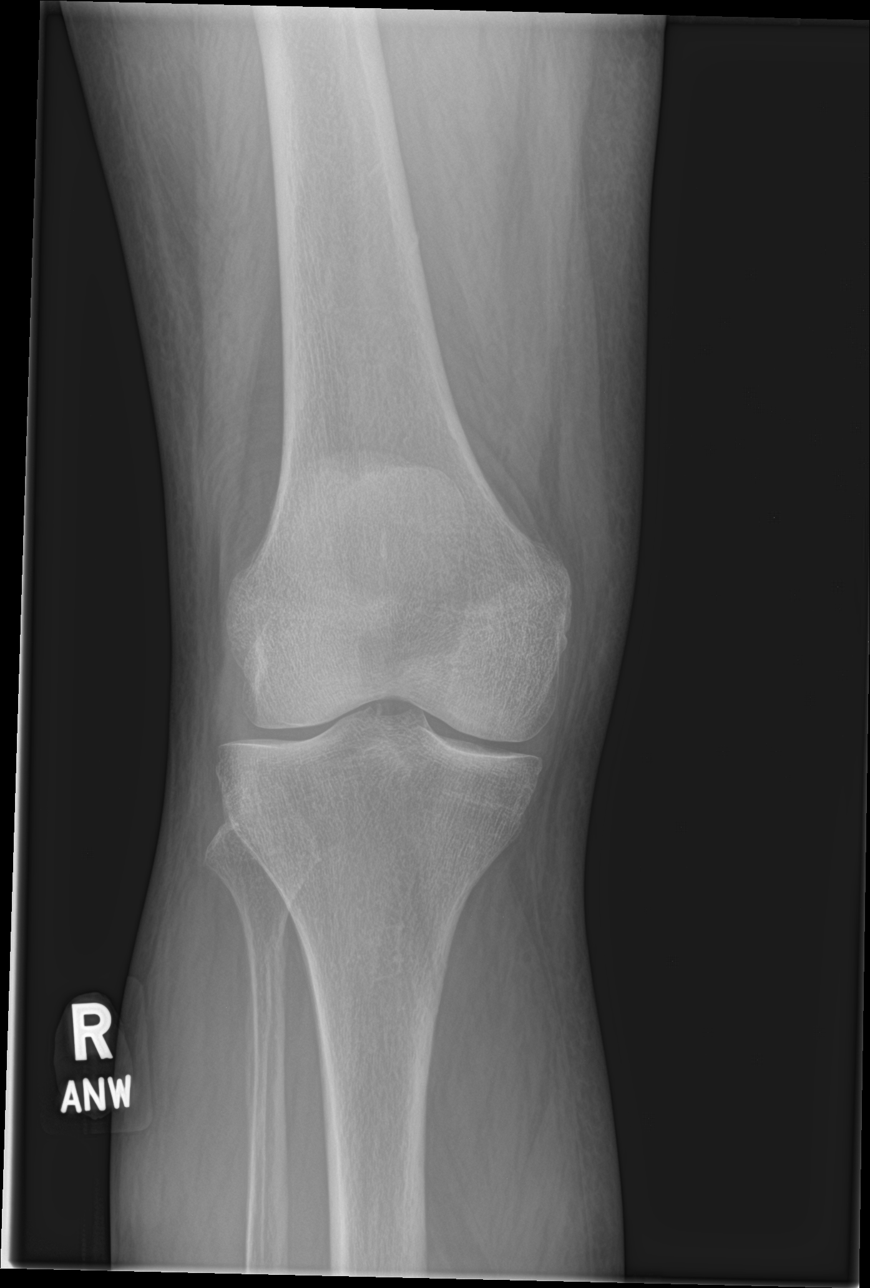

[knee lat]
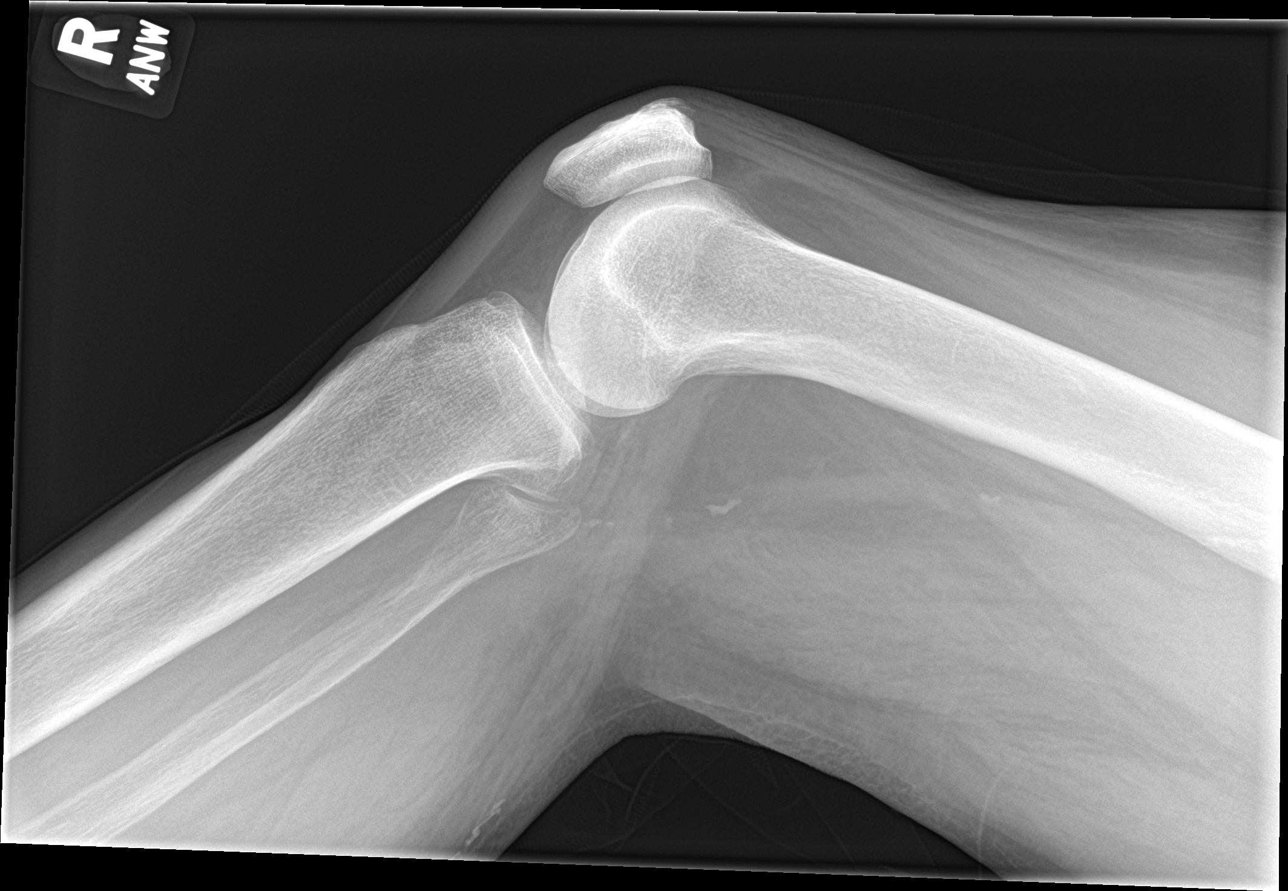

[knee obl (1 of 2)]
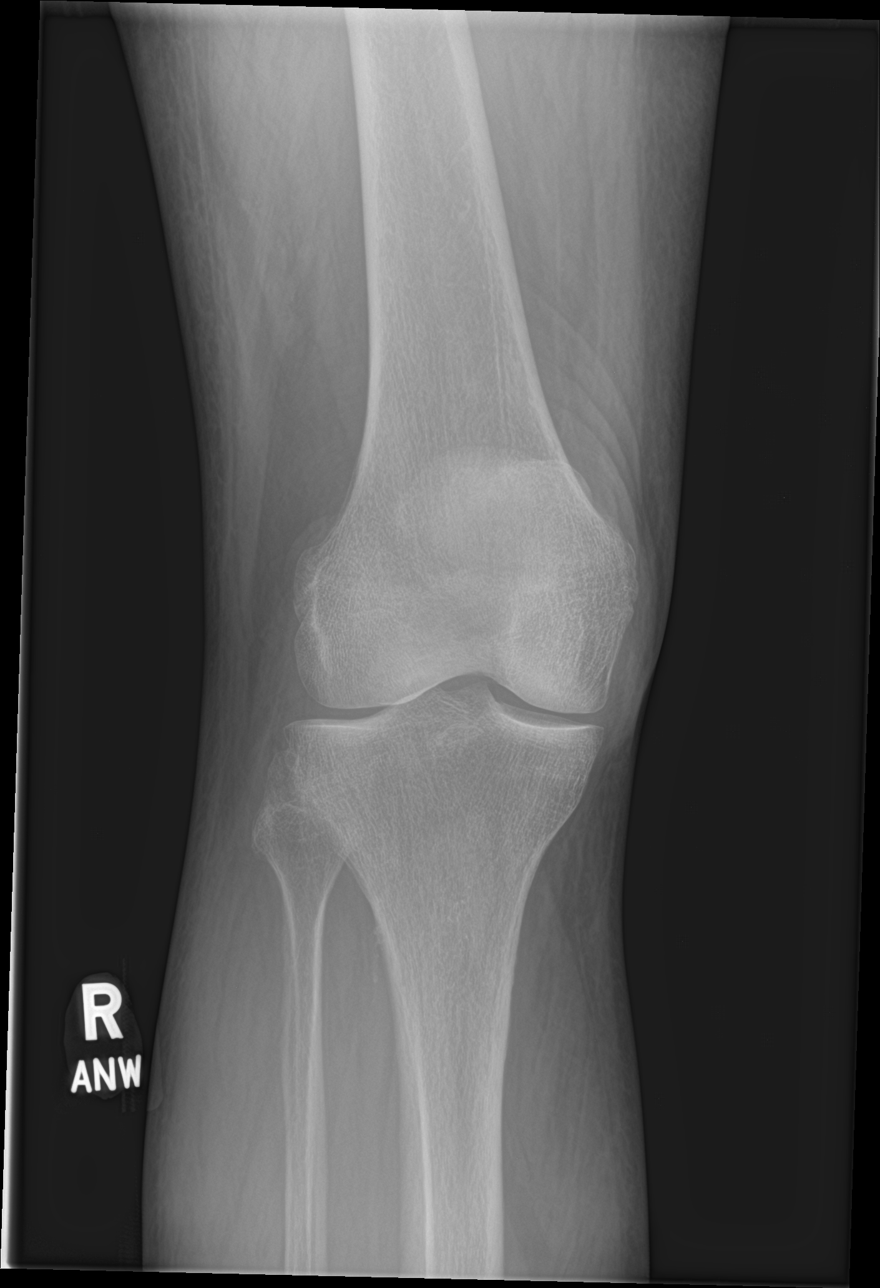

[knee obl (2 of 2)]
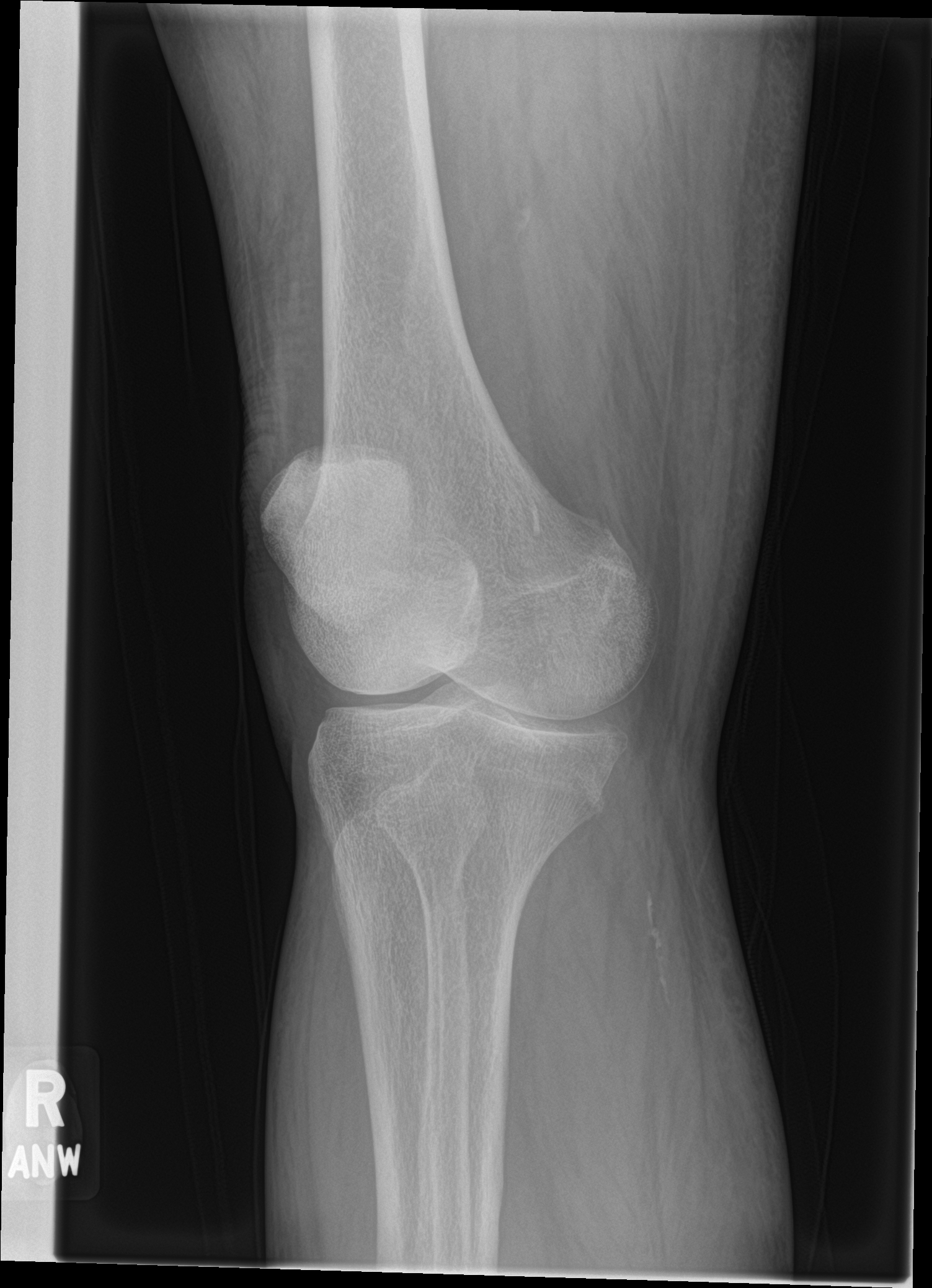

[4 of 4 positions shown; findings below may reference images not displayed]

FINDINGS: Mild tricompartmental articular space narrowing. Mild marginal
spurring in the patella along with spurring at the quadriceps
attachment site to the patella.

No fracture or knee effusion is observed.

Distal SFA and popliteal artery atherosclerotic calcification.
IMPRESSION: 1. Mild osteoarthritis.  No appreciable fracture or knee effusion.
2. Atherosclerosis.

## 2022-10-07 NOTE — Patient Outreach (Signed)
Aging Gracefully Program  RN Visit  10/07/2022  [name] 1950/11/08 [id number]  Visit:  RN Visit Number: 4- Fourth Visit  RN TIME CALCULATION: Start TIme:  RN Start Time Calculation: 1008 End Time:  RN Stop Time Calculation: 1100 Total Minutes:     Readiness To Change Score:     Universal RN Interventions: Calendar Distribution: Yes Exercise Review: Yes Medications: Yes Medication Changes: Yes Mood: Yes Pain: Yes PCP Advocacy/Support: Yes Fall Prevention: Yes Incontinence: Yes Clinician View Of Client Situation: Arrived for home visit and met outside on the porch.  [facility] housing [facility] here to get contacts signed. Sister present. Client View Of His/Her Situation: reports that she is doing her home exercises.  Reports that she feels like she has more energy.  Healthcare Provider Communication: Did Surveyor, mining With [employer] Provider?: No According to Client, Did PCP Report Communication With An Aging Gracefully RN?: No  Clinician View of Client Situation: Clinician View Of Client Situation: Arrived for home visit and met outside on the porch.  [facility] housing [facility] here to get contacts signed. Sister present. Client's View of His/Her Situation: Client View Of His/Her Situation: reports that she is doing her home exercises.  Reports that she feels like she has more energy.  Medication Assessment: denies any changes to medications.    OT Update: notified that RN home visits are completed.   Session Summary: Doing well post discharge. Active with [facility] Child psychotherapist.    Goals Addressed             This Visit's Progress    Aging Gracefully RN:       Goal: Patient will report decrease in constipation in the next 160 days.  07/01/2022 Assessment:   Reviewed long history of constipation concerns with patient.  Patient reports that she does not take any thing for her bowels on a regular basis. Reports she may have a bowel movement 2  times in one day and then skip 5-7 days before having another BM.   Interventions:  reviewed causes for constipation.  Encouraged patient to keep log ( she has been doing this).  Encouraged patient to increase water, exercise and fiber. Encouraged patient to take her Senna 1 tablet twice a day and then we will see if she has improvement.  Reviewed importance of eating regular meals.  Plan: next home visit planned for 07/29/2022  Rowe Pavy RN, BSN, CEN RN Case Manager for Aging Gracefully [facility] Mobile: [phone]  CLIENT/RN ACTION PLAN - INCONTINENCE (Leak urine or bowel)  Registered Nurse:  Rowe Pavy RN Date:07/01/2022  Client Name:[name] Client ID:    Target Area:  CONSTIPATION   Why Problem May Occur: LACK OF EXERCISE, MEDICATION SIDE EFFECTS, LIMITED MOBILITY, LACK OF FIBER   Target Goal: Patient will report decrease in constipation in the 160 days.   STRATEGIES Coping Strategies Ideas:  Prevent Constipation Full belly presses the bladder and blocks urination. Drink more water. Eat more fruit, vegetables, high-fiber cereal.   Medications Take medications as prescribed  Plan Ahead Keep BM log  Diet Eat a fiber rich diet Stay hydrated   Mobility Stay active .  Other    Prevention Strategies Ideas  Recognize when you feel constipated.  If medications as not working. Try prunes and prune juice Call MD and do not go longer than 5 days between bowel movements.  PRACTICE It is important to practice the strategies so we can determine if they will be effective in helping to reach the goal.    Follow these specific recommendations:        If strategy does not work the first time, try it again.  We may make some changes over the next few sessions.      Rowe Pavy RN, BSN, CEN RN Case Production designer, theatre/television/film for [employer] [facility] Mobile: [phone]  07/29/2022 Assessment:  Patient reports that she  is having bowel movements every 7  days. States no changes.  Taking miralax as needed.  Reports she does not want to take every day. States she knows that she need to take it every day.  Has not increased any fiber. Patient recording BM's in her phone.  Interventions: Encouraged patient to take her medications for her bowel movements daily.  Reviewed use of fiber.  Reviewed importance of hydration. Provided exercise booklet and demonstrated. Encouraged daily completion.  Plan:  Next home visit planned for 08/26/2022  Rowe Pavy RN, BSN, CEN RN Case Manager for Aging Gracefully [facility] Mobile: [phone]  08/26/2022 Assessment:  Reports continued constipation and MD started patient on Linzess. Reports bowel movements every other day. Reports she is doing her home exercises twice a week.   Interventions: reviewed medications and fall prevention.  Reviewed BM log. Encouraged daily exercise.    Plan: Next home visit planned for 09/30/2022  Rowe Pavy RN, BSN, CEN RN Case Manager for Aging Gracefully [facility] Mobile: [phone]  10/07/2022 Assessment:  Patient reports that her bowels are moving well.  Almost daily.    Interventions:  reviewed importance of continuing to take all her medications as prescribed.  Encouraged patient to talk to MD this week during office visit about her questions about need for iron in the future.  Encouraged patient to continue to do her home exercise.   Plan: RN home visits completed. OT notified.   Rowe Pavy RN, BSN, Careers adviser for [employer] Mobile: [phone]        Rowe Pavy RN, BSN, Careers adviser for [employer] Mobile: [phone]

## 2022-10-07 NOTE — Patient Instructions (Signed)
Visit Information  Thank you for taking time to visit with me today. Please don't hesitate to contact me if I can be of assistance to you before our next scheduled home appointment.  Following are the goals we discussed today:   Goals Addressed             This Visit's Progress    Aging Gracefully RN:       Goal: Patient will report decrease in constipation in the next 160 days.  07/01/2022 Assessment:   Reviewed long history of constipation concerns with patient.  Patient reports that she does not take any thing for her bowels on a regular basis. Reports she may have a bowel movement 2 times in one day and then skip 5-7 days before having another BM.   Interventions:  reviewed causes for constipation.  Encouraged patient to keep log ( she has been doing this).  Encouraged patient to increase water, exercise and fiber. Encouraged patient to take her Senna 1 tablet twice a day and then we will see if she has improvement.  Reviewed importance of eating regular meals.  Plan: next home visit planned for 07/29/2022  Rowe Pavy RN, BSN, CEN RN Case Manager for Aging Grace[facility] Mobile: [phone]  CLIENT/RN ACTION PLAN - INCONTINENCE (Leak urine or bowel)  Registered Nurse:  Rowe Pavy RN Date:07/01/2022  Client Name:[name] Client ID:    Target Area:  CONSTIPATION   Why Problem May Occur: LACK OF EXERCISE, MEDICATION SIDE EFFECTS, LIMITED MOBILITY, LACK OF FIBER   Target Goal: Patient will report decrease in constipation in the 160 days.   STRATEGIES Coping Strategies Ideas:  Prevent Constipation Full belly presses the bladder and blocks urination. Drink more water. Eat more fruit, vegetables, high-fiber cereal.   Medications Take medications as prescribed  Plan Ahead Keep BM log  Diet Eat a fiber rich diet Stay hydrated   Mobility Stay active .  Other    Prevention Strategies Ideas  Recognize when you feel constipated.  If medications  as not working. Try prunes and prune juice Call MD and do not go longer than 5 days between bowel movements.                       PRACTICE It is important to practice the strategies so we can determine if they will be effective in helping to reach the goal.    Follow these specific recommendations:        If strategy does not work the first time, try it again.  We may make some changes over the next few sessions.      Rowe Pavy RN, BSN, CEN RN Case Production designer, theatre/television/film for [employer] [facility] Mobile: [phone]  07/29/2022 Assessment:  Patient reports that she is having bowel movements every 7  days. States no changes.  Taking miralax as needed.  Reports she does not want to take every day. States she knows that she need to take it every day.  Has not increased any fiber. Patient recording BM's in her phone.  Interventions: Encouraged patient to take her medications for her bowel movements daily.  Reviewed use of fiber.  Reviewed importance of hydration. Provided exercise booklet and demonstrated. Encouraged daily completion.  Plan:  Next home visit planned for 08/26/2022  Rowe Pavy RN, BSN, CEN RN Case Manager for Aging Grace[facility] Mobile: [phone]  08/26/2022 Assessment:  Reports continued constipation and MD started patient on Linzess.  Reports bowel movements every other day. Reports she is doing her home exercises twice a week.   Interventions: reviewed medications and fall prevention.  Reviewed BM log. Encouraged daily exercise.    Plan: Next home visit planned for 09/30/2022  Rowe Pavy RN, BSN, CEN RN Case Manager for Aging [facility] Mobile: [phone]  10/07/2022 Assessment:  Patient reports that her bowels are moving well.  Almost daily.    Interventions:  reviewed importance of continuing to take all her medications as prescribed.  Encouraged patient to talk to MD this week during  office visit about her questions about need for iron in the future.  Encouraged patient to continue to do her home exercise.   Plan: RN home visits completed. OT notified.   Rowe Pavy RN, BSN, CEN RN Case Production designer, theatre/television/film for Aging Grace[facility] Mobile: [phone]          If you are experiencing a Mental Health or Behavioral Health Crisis or need someone to talk to, please call the Suicide and Crisis Lifeline: 988 call the [place] National Suicide Prevention Lifeline: 651-780-8350 or TTY: 231-705-4641 TTY (858)088-4878) to talk to a trained counselor call 1-800-273-TALK (toll free, 24 hour hotline) go to [facility] [address] [place] [phone]) call 911   The patient verbalized understanding of instructions, educational materials, and care plan provided today and agreed to receive a mailed copy of patient instructions, educational materials, and care plan.   Rowe Pavy RN, BSN, Careers adviser for [employer] Mobile: [phone]

## 2022-10-09 ENCOUNTER — Encounter: Payer: PPO | Admitting: Internal Medicine

## 2022-10-09 ENCOUNTER — Other Ambulatory Visit (HOSPITAL_COMMUNITY): Payer: Self-pay

## 2022-10-09 ENCOUNTER — Ambulatory Visit (INDEPENDENT_AMBULATORY_CARE_PROVIDER_SITE_OTHER): Payer: PPO | Admitting: Internal Medicine

## 2022-10-09 VITALS — BP 134/52 | HR 65 | Temp 97.7°F | Ht 62.0 in | Wt 159.5 lb

## 2022-10-09 DIAGNOSIS — M545 Low back pain, unspecified: Secondary | ICD-10-CM | POA: Diagnosis not present

## 2022-10-09 DIAGNOSIS — T402X5A Adverse effect of other opioids, initial encounter: Secondary | ICD-10-CM

## 2022-10-09 DIAGNOSIS — E669 Obesity, unspecified: Secondary | ICD-10-CM

## 2022-10-09 DIAGNOSIS — M79674 Pain in right toe(s): Secondary | ICD-10-CM

## 2022-10-09 DIAGNOSIS — I739 Peripheral vascular disease, unspecified: Secondary | ICD-10-CM | POA: Diagnosis not present

## 2022-10-09 DIAGNOSIS — N1831 Chronic kidney disease, stage 3a: Secondary | ICD-10-CM

## 2022-10-09 DIAGNOSIS — D509 Iron deficiency anemia, unspecified: Secondary | ICD-10-CM

## 2022-10-09 DIAGNOSIS — I480 Paroxysmal atrial fibrillation: Secondary | ICD-10-CM | POA: Diagnosis not present

## 2022-10-09 DIAGNOSIS — M79675 Pain in left toe(s): Secondary | ICD-10-CM

## 2022-10-09 DIAGNOSIS — Z794 Long term (current) use of insulin: Secondary | ICD-10-CM

## 2022-10-09 DIAGNOSIS — R519 Headache, unspecified: Secondary | ICD-10-CM

## 2022-10-09 DIAGNOSIS — E1169 Type 2 diabetes mellitus with other specified complication: Secondary | ICD-10-CM | POA: Diagnosis not present

## 2022-10-09 DIAGNOSIS — F419 Anxiety disorder, unspecified: Secondary | ICD-10-CM

## 2022-10-09 DIAGNOSIS — G44219 Episodic tension-type headache, not intractable: Secondary | ICD-10-CM

## 2022-10-09 DIAGNOSIS — G8929 Other chronic pain: Secondary | ICD-10-CM | POA: Diagnosis not present

## 2022-10-09 DIAGNOSIS — Z6829 Body mass index (BMI) 29.0-29.9, adult: Secondary | ICD-10-CM

## 2022-10-09 DIAGNOSIS — J449 Chronic obstructive pulmonary disease, unspecified: Secondary | ICD-10-CM | POA: Diagnosis not present

## 2022-10-09 DIAGNOSIS — K5903 Drug induced constipation: Secondary | ICD-10-CM

## 2022-10-09 DIAGNOSIS — F1190 Opioid use, unspecified, uncomplicated: Secondary | ICD-10-CM

## 2022-10-09 MED ORDER — ALPRAZOLAM 1 MG PO TABS
0.5000 mg | ORAL_TABLET | Freq: Every evening | ORAL | 0 refills | Status: DC | PRN
Start: 2022-10-09 — End: 2022-10-22
  Filled 2022-10-09: qty 45, 45d supply, fill #0

## 2022-10-09 NOTE — Assessment & Plan Note (Signed)
Annual surveillance utox today.  She understands policy.

## 2022-10-09 NOTE — Assessment & Plan Note (Signed)
Experiencing more pain in her right lower back lately.  On examination she does have increased bulk and tightness of her right paralumbar muscles compared to the left, with associated tenderness on palpation.  This musculoskeletal problem is difficult to manage.  Ideally she would have massage and daily exercises under professional supervision.  Water therapy would also be very helpful.  These options are not currently available.

## 2022-10-09 NOTE — Assessment & Plan Note (Signed)
Vascular tests and f/u upcoming.

## 2022-10-09 NOTE — Assessment & Plan Note (Signed)
Scattered rales, no rhonchi or wheezing today, breathing comfortably. Unable to discharge new albuterol MDI due to arthritis and weakness in hands (previous one was easier to activate).  She understands from pharmacist that the one she used to receive is no longer available.  She will use her duoneb inhalers in the meantime, and continues her Trelegy Ellipta.  New pulmonologist appt  next month.

## 2022-10-09 NOTE — Assessment & Plan Note (Signed)
Reports ongoing headache since hospital discharge last week.  Similar to former headaches.  While she has had Toradol injections in the past for this, we discussed concern about renal function and Tylenol or her long-term opioids may be her best bet.  Unfortunately she lives with chronic pain which is an ongoing battle.

## 2022-10-09 NOTE — Assessment & Plan Note (Addendum)
A1c 7.4 last visit; due in a month. Eye appt is scheduled. Foot exam today. UA/C today. The patient is currently using Continuous Glucose Monitoring. The patient is injecting insulin 1 times a day. The patient is making adjustments to their diabetes regimen based on glucose readings.  Approx 80% readings in range, 20% above; no hypoglycemia.  Dietary patterns and food choices are inconsistent.  Continue Insulin Degludec 44 units daily and semaglutide 1 mg daily.

## 2022-10-09 NOTE — Assessment & Plan Note (Signed)
Referred to podiatry; sxs improved with specialist nail trimming and Emla cream.  Toes examined today - no compromise of skin.

## 2022-10-09 NOTE — Assessment & Plan Note (Signed)
Admitted to hospital for blood transfusion, tolerated well.  She is now pink and hands are warm.  Recheck CBC today.  Refer to hematology for evaluation of chronic iron deficiency anemia requiring transfusions, ongoing for years.

## 2022-10-09 NOTE — Progress Notes (Addendum)
Hospital follow-up for 72 year old [name] who was directly admitted from our last visit a couple weeks ago for acute on chronic symptomatic iron deficiency anemia requiring transfusion.  She received 2 units as well as iron infusion and felt significant improvement.  Upon PT and OT evaluation, recommendation was for SNF for ongoing recuperation.  This was planned, though at the last minute [facility] announced that they had a COVID outbreak.  [name] elected to discharge home with home health therapy.  Today we reconciled meds and instructions from discharge summary.  Upcoming visits are scheduled with ophthalmologist, her new pulmonologist, and vascular follow-up.  She reports feeling an ongoing headache since hospital discharge, and is also experiencing worsened right low back pain.  These items are noted in more detail in assessment and plan.  BP (!) 134/52 (BP Location: Right Arm, Patient Position: Sitting, Cuff Size: Small)   Pulse 65   Temp 97.7 F (36.5 C) (Oral)   Ht 5\' 2"  (1.575 m)   Wt 159 lb 8 oz (72.3 kg)   SpO2 93%   BMI 29.17 kg/m   [name] looks much better than she did at our last visit, at which time she was extremely pale and cool.  She now has good pink color with good cap refill in her fingers, though chronically has delay CR in her toes.  She has a hoarse voice and a hoarse dry sounding cough which is her baseline.  Scattered rales are noted with nonlabored respirations, though no wheezing or rhonchi.  Turgor is fair to low.  There is no lower extremity edema.  Foot exam completed and documented under quality measures.  Other problem-associated physical exam findings are noted below.  Assessment and plan:  Headache Reports ongoing headache since hospital discharge last week.  Similar to former headaches.  While she has had Toradol injections in the past for this, we discussed concern about renal function and Tylenol or her long-term opioids may be her best bet.   Unfortunately she lives with chronic pain which is an ongoing battle.  Chronic low back pain Experiencing more pain in her right lower back lately.  On examination she does have increased bulk and tightness of her right paralumbar muscles compared to the left, with associated tenderness on palpation.  This musculoskeletal problem is difficult to manage.  Ideally she would have massage and daily exercises under professional supervision.  Water therapy would also be very helpful.  These options are not currently accessible.  Chronic iron deficiency anemia Admitted to hospital for blood transfusion, tolerated well.  She is now pink and hands are warm.  Recheck CBC today.  Refer to hematology for evaluation of chronic iron deficiency anemia requiring transfusions, ongoing for years.  Stage 3a chronic kidney disease (CKD) (HCC) Most recent EGFR greater than 60, and UAC last year was only minimally elevated.  Recheck urine today.  She is on GLP-1 a for DM which also provides renal protection.  Type 2 diabetes mellitus with other specified complication (HCC) A1c 7.4 last visit; due in a month. Eye appt is scheduled. Foot exam today. UA/C today. The patient is currently using Continuous Glucose Monitoring. The patient is injecting insulin 1 times a day. The patient is making adjustments to their diabetes regimen based on glucose readings.  Approx 80% readings in range, 20% above; no hypoglycemia.  Dietary patterns and food choices are inconsistent.  Continue Insulin Degludec 44 units daily and semaglutide 1 mg daily.    Peripheral arterial  disease [facility]) Vascular tests and f/u upcoming.   PAF (paroxysmal atrial fibrillation) (HCC) NSR on exam today.  Ongoing anticoagulation with warfarin (hx of splenic infarct) managed by [facility] anticoag clinic.   COPD mixed type (HCC) Scattered rales, no rhonchi or wheezing today, breathing comfortably. Unable to discharge new albuterol MDI due to arthritis and  weakness in hands (previous one was easier to activate).  She understands from pharmacist that the one she used to receive is no longer available.  She will use her duoneb inhalers in the meantime, and continues her Trelegy Ellipta.  New pulmonologist appt  next month.    Pain of great toes medial nail beds of both feet Referred to podiatry; sxs improved with specialist nail trimming and Emla cream.  Toes examined today - no compromise of skin.  Obesity (BMI 30.0-34.9) BMI < 30, on GLP1a.  Resolved obesity problem.  She does wish to lose more weight, but Ozempic is not being increased as she has had some GI symptoms we wish not to exacerbate.   Chronic, continuous use of opioids for chronic pain Annual surveillance utox today.  She understands policy.   Constipation due to opioid therapy Improved with Linzess though daily use has caused diarrhea.  She will adjust dose, starting with every m/w/f.

## 2022-10-09 NOTE — Assessment & Plan Note (Signed)
NSR on exam today.  Ongoing anticoagulation with warfarin (hx of splenic infarct) managed by [facility] anticoag clinic.

## 2022-10-09 NOTE — Assessment & Plan Note (Signed)
Most recent EGFR greater than 60, and UAC last year was only minimally elevated.  Recheck urine today.  She is on GLP-1 a for DM which also provides renal protection.

## 2022-10-09 NOTE — Assessment & Plan Note (Signed)
Improved with Linzess though daily use has caused diarrhea.  She will adjust dose, starting with every m/w/f.

## 2022-10-09 NOTE — Assessment & Plan Note (Signed)
BMI < 30, on GLP1a.  Resolved obesity problem.  She does wish to lose more weight, but Ozempic is not being increased as she has had some GI symptoms we wish not to exacerbate.

## 2022-10-10 ENCOUNTER — Other Ambulatory Visit: Payer: Self-pay

## 2022-10-10 LAB — CBC
Hematocrit: 36.2 % (ref 34.0–46.6)
Hemoglobin: 10.3 g/dL — ABNORMAL LOW (ref 11.1–15.9)
MCH: 22.5 pg — ABNORMAL LOW (ref 26.6–33.0)
MCHC: 28.5 g/dL — ABNORMAL LOW (ref 31.5–35.7)
MCV: 79 fL (ref 79–97)
Platelets: 265 10*3/uL (ref 150–450)
RBC: 4.58 x10E6/uL (ref 3.77–5.28)
RDW: 22.4 % — ABNORMAL HIGH (ref 11.7–15.4)
WBC: 8.2 10*3/uL (ref 3.4–10.8)

## 2022-10-10 LAB — MICROALBUMIN / CREATININE URINE RATIO
Creatinine, Urine: 34.8 mg/dL
Microalb/Creat Ratio: 166 mg/g{creat} — ABNORMAL HIGH (ref 0–29)
Microalbumin, Urine: 57.9 ug/mL

## 2022-10-12 DIAGNOSIS — G4733 Obstructive sleep apnea (adult) (pediatric): Secondary | ICD-10-CM | POA: Diagnosis not present

## 2022-10-13 ENCOUNTER — Telehealth: Payer: Self-pay | Admitting: Pharmacist

## 2022-10-13 NOTE — Telephone Encounter (Signed)
Patient reports value of PST FS POC INR collected today = 2.6 (target range, 2/2 Hx of bleed(s) is 2.0 - 2.5). on 12. 5mg  warfarin per week. Will reduce to 11.25 mg warfarin/wk. and repeat INR [id number]. No bleeding, no new medications.

## 2022-10-14 ENCOUNTER — Encounter: Payer: Self-pay | Admitting: Internal Medicine

## 2022-10-14 IMAGING — US US RENAL
1 series · 13 of 25 positions shown · non-contrast
Comparison: Abdomen CT dated 10/07/2016.

CLINICAL DATA: 3.0 cm low-attenuation lesion in the upper pole of
the right kidney on a recent chest CT. Status post cryoablation of a
lower pole left renal cell carcinoma on 09/19/2011. Abdomen MRI
dated 06/25/2017.

EXAM:
RENAL / URINARY TRACT ULTRASOUND COMPLETE

[Series 1: us renal · 0.23mm/px · 13 of 72 slices shown]
[im 1/72]
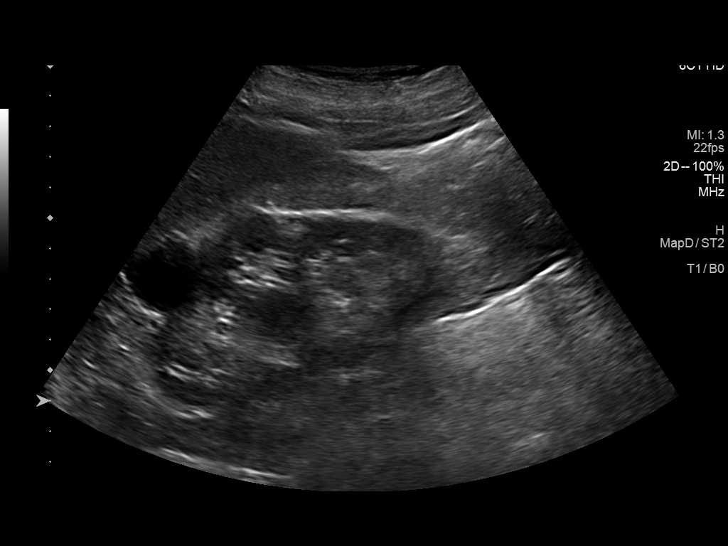
[im 6/72]
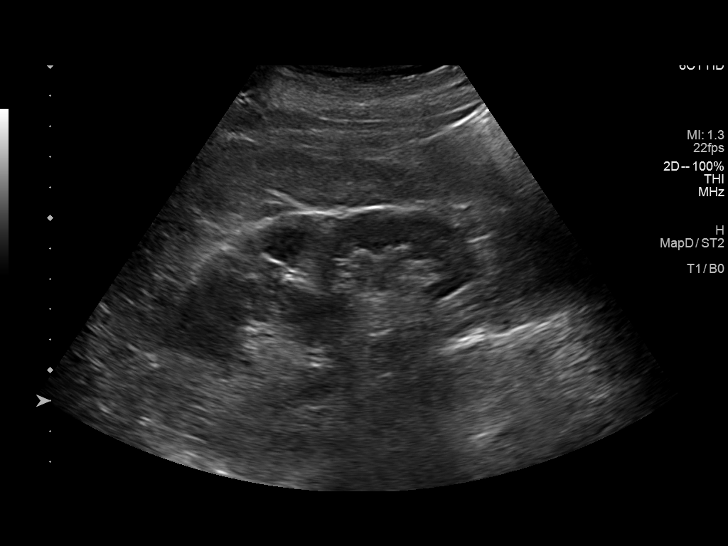
[im 12/72]
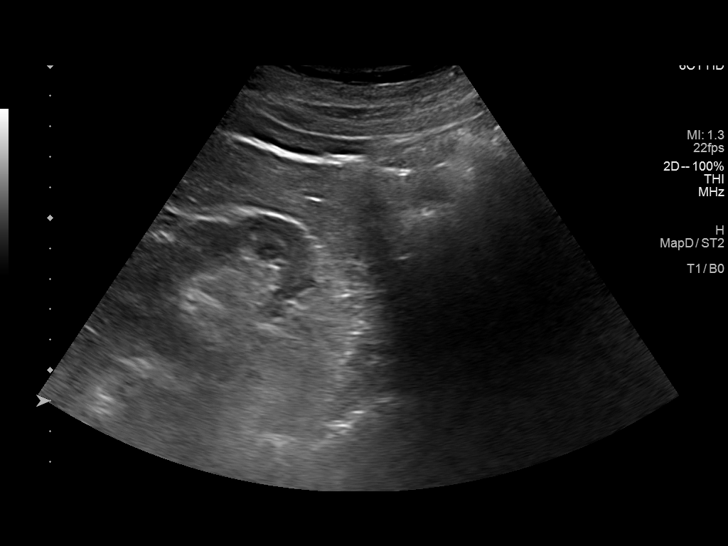
[im 18/72]
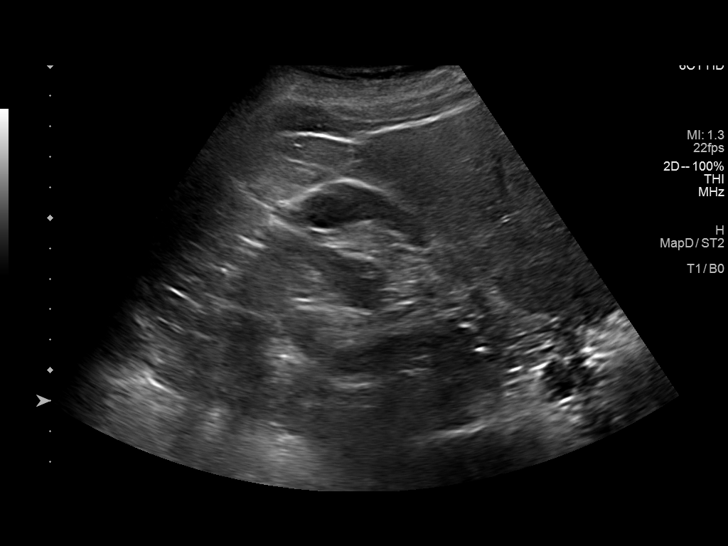
[im 24/72]
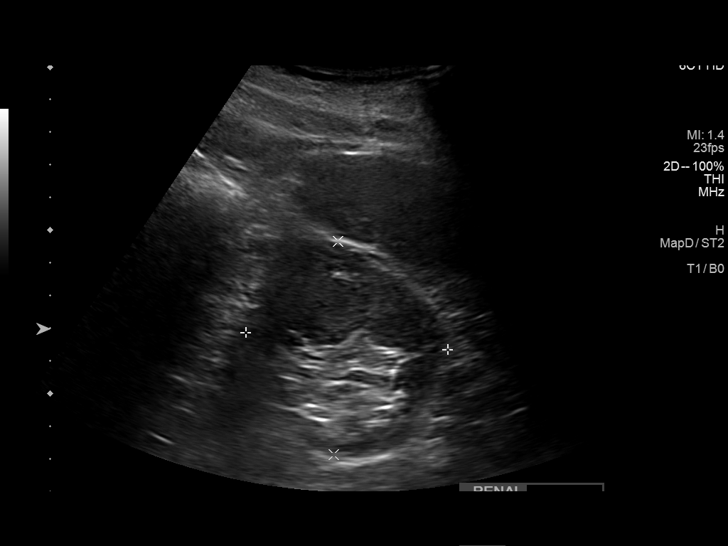
[im 30/72]
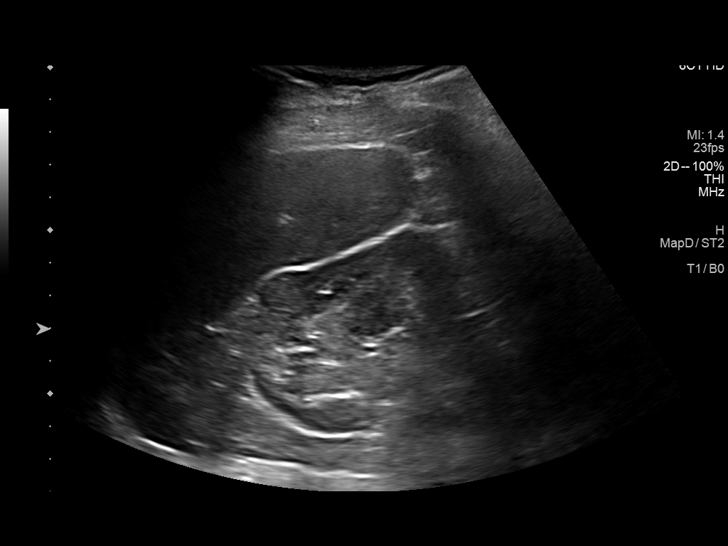
[im 36/72]
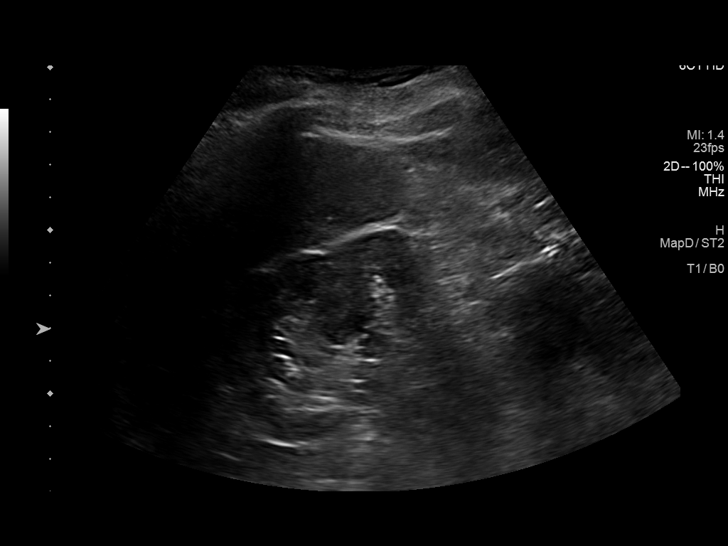
[im 42/72]
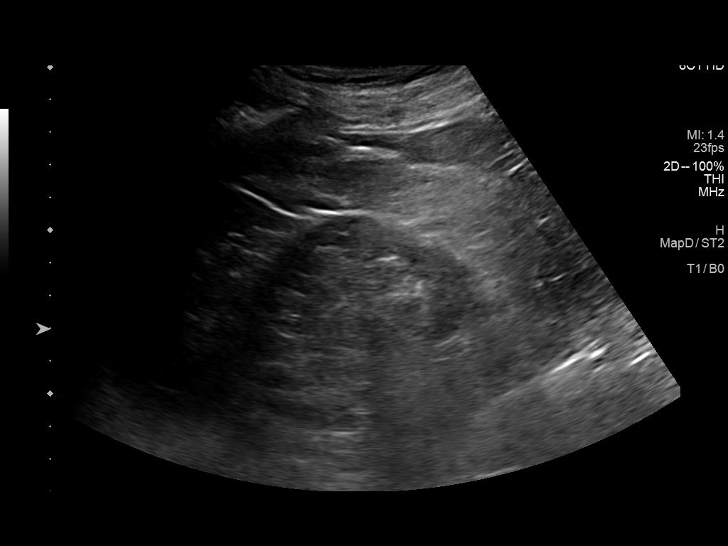
[im 48/72]
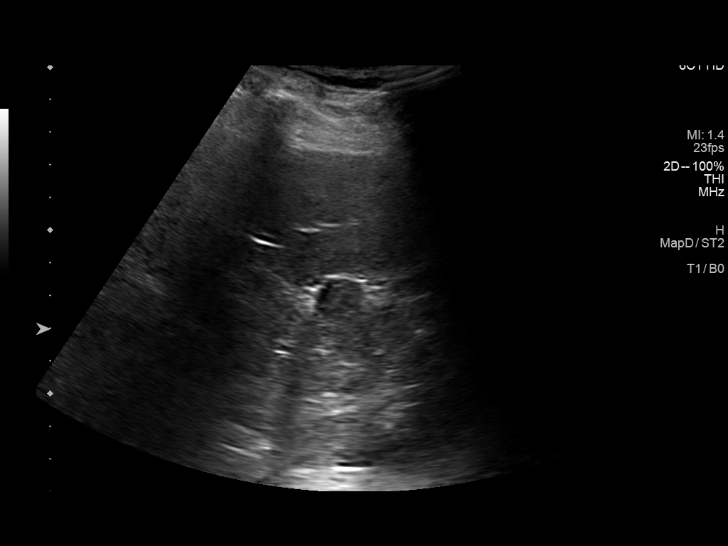
[im 54/72]
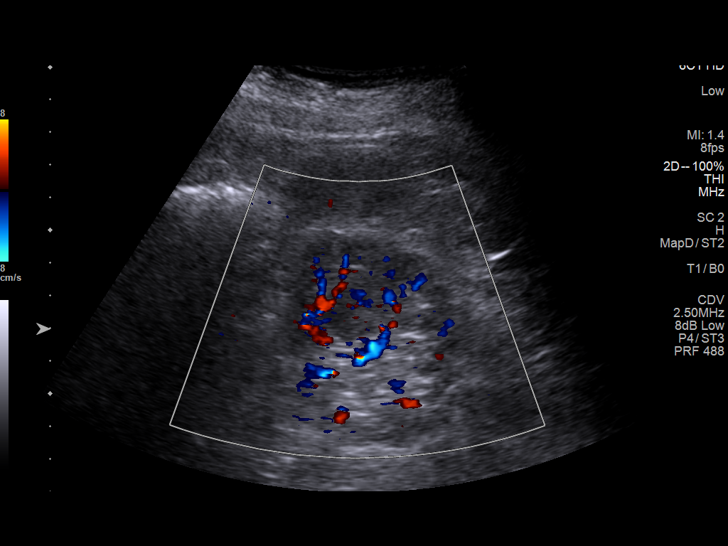
[im 60/72]
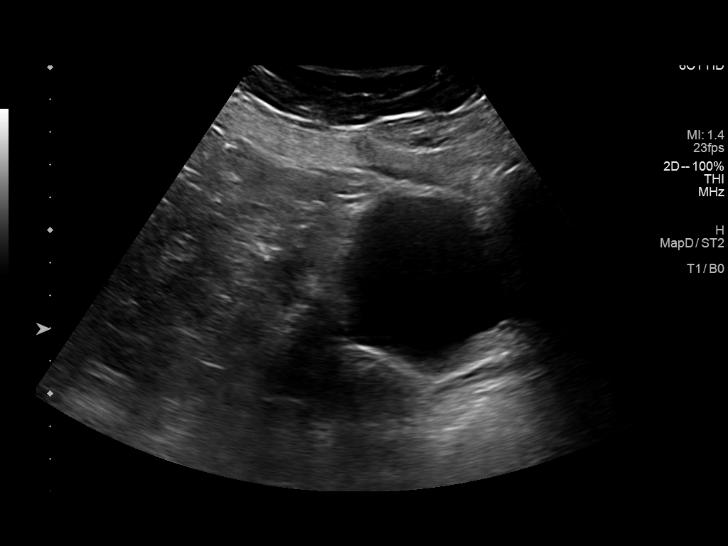
[im 66/72]
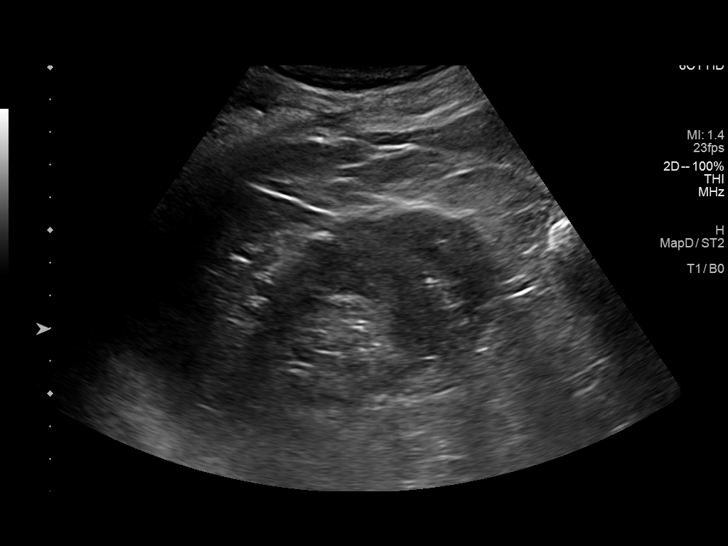
[im 72/72]
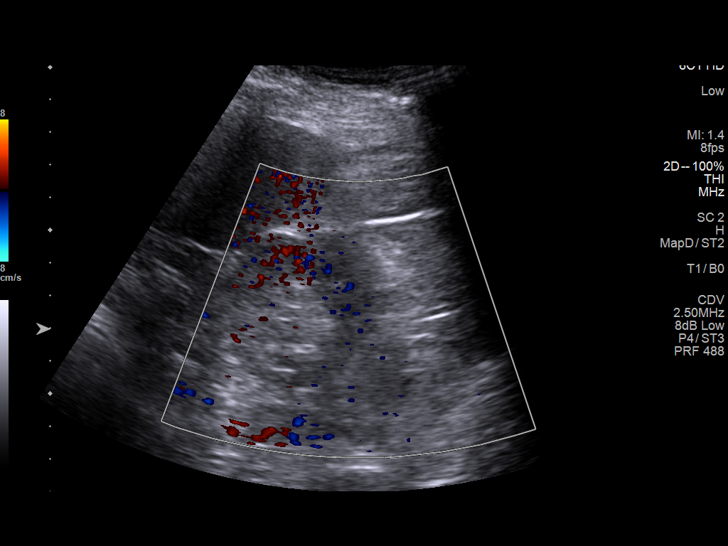

[13 of 25 positions shown; findings below may reference images not displayed]

FINDINGS: Right Kidney:

Renal measurements: 10.6 x 6.4 x 5.9 cm = volume: 207 mL. Normal
echotexture. 2.9 cm simple upper pole cyst, corresponding to the
recently demonstrated upper pole mass. A previously demonstrated
smaller mid to upper right renal cyst currently measures 1.4 cm in
maximum diameter with diffuse low level internal echoes. This
measured 1.1 cm on 04/25/2017.

Left Kidney:

Renal measurements: 7.5 x 6.5 x 6.2 cm = volume: 159 mL. Normal
echotexture. Possible interval partially exophytic upper pole solid
mass measuring approximately 1.8 x 1.5 x 1.2 cm. There is also a
x 2.8 x 2.2 cm oval mass-like area in the mid to lower left kidney
with medium echotexture and echogenic components. Previously
demonstrated post ablation changes in the lower pole of the left
kidney.

Bladder:

Appears normal for degree of bladder distention.

Other:

None.
IMPRESSION: 1. 1.8 cm possible solid upper pole left renal mass.
2. 3.8 cm possible solid mass in the mid to lower left kidney.
Further evaluation of both of these areas with pre and postcontrast
MRI of the kidneys is recommended.
3. 2.9 cm simple upper pole right renal cyst and 1.4 cm probable
complicated cyst in the mid upper right kidney.

## 2022-10-16 ENCOUNTER — Other Ambulatory Visit: Payer: Self-pay

## 2022-10-16 ENCOUNTER — Telehealth: Payer: Self-pay | Admitting: *Deleted

## 2022-10-16 ENCOUNTER — Other Ambulatory Visit (HOSPITAL_COMMUNITY): Payer: Self-pay

## 2022-10-16 NOTE — Telephone Encounter (Signed)
Call from patient has had a strange feeling in her Chest today-pain went down left side briefly.  Went away.  Went to sleep woke up and pain was briefly in middle of chest.  Did not go down arm .  No other symptoms like burping or nausea. Feels ok now.. Wants to see if Dr. [name] will give her a call.

## 2022-10-17 ENCOUNTER — Emergency Department (HOSPITAL_COMMUNITY): Payer: PPO | Admitting: Anesthesiology

## 2022-10-17 ENCOUNTER — Encounter (HOSPITAL_COMMUNITY): Payer: Self-pay | Admitting: Internal Medicine

## 2022-10-17 ENCOUNTER — Emergency Department (HOSPITAL_COMMUNITY): Payer: PPO

## 2022-10-17 ENCOUNTER — Other Ambulatory Visit: Payer: Self-pay

## 2022-10-17 ENCOUNTER — Inpatient Hospital Stay (HOSPITAL_COMMUNITY)
Admission: EM | Admit: 2022-10-17 | Discharge: 2022-10-22 | DRG: 480 | Disposition: A | Discharge status: Skilled Nursing Facility | Payer: PPO | Attending: Internal Medicine | Admitting: Internal Medicine

## 2022-10-17 DIAGNOSIS — E1140 Type 2 diabetes mellitus with diabetic neuropathy, unspecified: Secondary | ICD-10-CM | POA: Diagnosis not present

## 2022-10-17 DIAGNOSIS — S72002A Fracture of unspecified part of neck of left femur, initial encounter for closed fracture: Secondary | ICD-10-CM | POA: Diagnosis present

## 2022-10-17 DIAGNOSIS — S42001A Fracture of unspecified part of right clavicle, initial encounter for closed fracture: Secondary | ICD-10-CM | POA: Diagnosis not present

## 2022-10-17 DIAGNOSIS — Z86718 Personal history of other venous thrombosis and embolism: Secondary | ICD-10-CM

## 2022-10-17 DIAGNOSIS — M797 Fibromyalgia: Secondary | ICD-10-CM | POA: Diagnosis present

## 2022-10-17 DIAGNOSIS — E785 Hyperlipidemia, unspecified: Secondary | ICD-10-CM | POA: Diagnosis present

## 2022-10-17 DIAGNOSIS — D631 Anemia in chronic kidney disease: Secondary | ICD-10-CM | POA: Diagnosis present

## 2022-10-17 DIAGNOSIS — Z885 Allergy status to narcotic agent status: Secondary | ICD-10-CM

## 2022-10-17 DIAGNOSIS — I130 Hypertensive heart and chronic kidney disease with heart failure and stage 1 through stage 4 chronic kidney disease, or unspecified chronic kidney disease: Secondary | ICD-10-CM | POA: Diagnosis present

## 2022-10-17 DIAGNOSIS — S72142D Displaced intertrochanteric fracture of left femur, subsequent encounter for closed fracture with routine healing: Secondary | ICD-10-CM | POA: Diagnosis not present

## 2022-10-17 DIAGNOSIS — E1169 Type 2 diabetes mellitus with other specified complication: Secondary | ICD-10-CM | POA: Diagnosis present

## 2022-10-17 DIAGNOSIS — G4733 Obstructive sleep apnea (adult) (pediatric): Secondary | ICD-10-CM | POA: Diagnosis present

## 2022-10-17 DIAGNOSIS — Z794 Long term (current) use of insulin: Secondary | ICD-10-CM

## 2022-10-17 DIAGNOSIS — E1151 Type 2 diabetes mellitus with diabetic peripheral angiopathy without gangrene: Secondary | ICD-10-CM | POA: Diagnosis present

## 2022-10-17 DIAGNOSIS — G8918 Other acute postprocedural pain: Secondary | ICD-10-CM | POA: Diagnosis not present

## 2022-10-17 DIAGNOSIS — I2510 Atherosclerotic heart disease of native coronary artery without angina pectoris: Secondary | ICD-10-CM | POA: Diagnosis present

## 2022-10-17 DIAGNOSIS — Z7901 Long term (current) use of anticoagulants: Secondary | ICD-10-CM | POA: Diagnosis not present

## 2022-10-17 DIAGNOSIS — W19XXXA Unspecified fall, initial encounter: Principal | ICD-10-CM

## 2022-10-17 DIAGNOSIS — M810 Age-related osteoporosis without current pathological fracture: Secondary | ICD-10-CM | POA: Diagnosis not present

## 2022-10-17 DIAGNOSIS — I480 Paroxysmal atrial fibrillation: Secondary | ICD-10-CM | POA: Diagnosis not present

## 2022-10-17 DIAGNOSIS — N200 Calculus of kidney: Secondary | ICD-10-CM | POA: Diagnosis not present

## 2022-10-17 DIAGNOSIS — S0990XA Unspecified injury of head, initial encounter: Secondary | ICD-10-CM | POA: Diagnosis not present

## 2022-10-17 DIAGNOSIS — S2232XA Fracture of one rib, left side, initial encounter for closed fracture: Secondary | ICD-10-CM | POA: Diagnosis not present

## 2022-10-17 DIAGNOSIS — Z888 Allergy status to other drugs, medicaments and biological substances status: Secondary | ICD-10-CM | POA: Diagnosis not present

## 2022-10-17 DIAGNOSIS — J9601 Acute respiratory failure with hypoxia: Secondary | ICD-10-CM | POA: Diagnosis not present

## 2022-10-17 DIAGNOSIS — G9340 Encephalopathy, unspecified: Secondary | ICD-10-CM | POA: Diagnosis not present

## 2022-10-17 DIAGNOSIS — S2241XA Multiple fractures of ribs, right side, initial encounter for closed fracture: Secondary | ICD-10-CM | POA: Diagnosis not present

## 2022-10-17 DIAGNOSIS — I4891 Unspecified atrial fibrillation: Secondary | ICD-10-CM | POA: Diagnosis not present

## 2022-10-17 DIAGNOSIS — M6281 Muscle weakness (generalized): Secondary | ICD-10-CM | POA: Diagnosis not present

## 2022-10-17 DIAGNOSIS — I509 Heart failure, unspecified: Secondary | ICD-10-CM | POA: Diagnosis not present

## 2022-10-17 DIAGNOSIS — W07XXXA Fall from chair, initial encounter: Secondary | ICD-10-CM | POA: Diagnosis present

## 2022-10-17 DIAGNOSIS — E559 Vitamin D deficiency, unspecified: Secondary | ICD-10-CM | POA: Diagnosis present

## 2022-10-17 DIAGNOSIS — K219 Gastro-esophageal reflux disease without esophagitis: Secondary | ICD-10-CM | POA: Diagnosis not present

## 2022-10-17 DIAGNOSIS — F1320 Sedative, hypnotic or anxiolytic dependence, uncomplicated: Secondary | ICD-10-CM | POA: Diagnosis not present

## 2022-10-17 DIAGNOSIS — Z818 Family history of other mental and behavioral disorders: Secondary | ICD-10-CM

## 2022-10-17 DIAGNOSIS — G3184 Mild cognitive impairment, so stated: Secondary | ICD-10-CM

## 2022-10-17 DIAGNOSIS — J449 Chronic obstructive pulmonary disease, unspecified: Secondary | ICD-10-CM | POA: Diagnosis not present

## 2022-10-17 DIAGNOSIS — J441 Chronic obstructive pulmonary disease with (acute) exacerbation: Secondary | ICD-10-CM | POA: Diagnosis not present

## 2022-10-17 DIAGNOSIS — R54 Age-related physical debility: Secondary | ICD-10-CM | POA: Diagnosis present

## 2022-10-17 DIAGNOSIS — Z8249 Family history of ischemic heart disease and other diseases of the circulatory system: Secondary | ICD-10-CM

## 2022-10-17 DIAGNOSIS — E118 Type 2 diabetes mellitus with unspecified complications: Secondary | ICD-10-CM | POA: Diagnosis present

## 2022-10-17 DIAGNOSIS — I272 Pulmonary hypertension, unspecified: Secondary | ICD-10-CM | POA: Diagnosis not present

## 2022-10-17 DIAGNOSIS — Z8673 Personal history of transient ischemic attack (TIA), and cerebral infarction without residual deficits: Secondary | ICD-10-CM

## 2022-10-17 DIAGNOSIS — Y92009 Unspecified place in unspecified non-institutional (private) residence as the place of occurrence of the external cause: Secondary | ICD-10-CM | POA: Diagnosis not present

## 2022-10-17 DIAGNOSIS — I2723 Pulmonary hypertension due to lung diseases and hypoxia: Secondary | ICD-10-CM | POA: Diagnosis present

## 2022-10-17 DIAGNOSIS — Z9981 Dependence on supplemental oxygen: Secondary | ICD-10-CM

## 2022-10-17 DIAGNOSIS — Z01818 Encounter for other preprocedural examination: Secondary | ICD-10-CM | POA: Diagnosis not present

## 2022-10-17 DIAGNOSIS — Z7985 Long-term (current) use of injectable non-insulin antidiabetic drugs: Secondary | ICD-10-CM

## 2022-10-17 DIAGNOSIS — K5900 Constipation, unspecified: Secondary | ICD-10-CM | POA: Diagnosis present

## 2022-10-17 DIAGNOSIS — G8929 Other chronic pain: Secondary | ICD-10-CM

## 2022-10-17 DIAGNOSIS — Z96642 Presence of left artificial hip joint: Secondary | ICD-10-CM | POA: Diagnosis not present

## 2022-10-17 DIAGNOSIS — Z8781 Personal history of (healed) traumatic fracture: Secondary | ICD-10-CM | POA: Diagnosis present

## 2022-10-17 DIAGNOSIS — S199XXA Unspecified injury of neck, initial encounter: Secondary | ICD-10-CM | POA: Diagnosis not present

## 2022-10-17 DIAGNOSIS — M25552 Pain in left hip: Secondary | ICD-10-CM | POA: Diagnosis not present

## 2022-10-17 DIAGNOSIS — Z8679 Personal history of other diseases of the circulatory system: Secondary | ICD-10-CM

## 2022-10-17 DIAGNOSIS — E1122 Type 2 diabetes mellitus with diabetic chronic kidney disease: Secondary | ICD-10-CM | POA: Diagnosis not present

## 2022-10-17 DIAGNOSIS — Z8489 Family history of other specified conditions: Secondary | ICD-10-CM

## 2022-10-17 DIAGNOSIS — F419 Anxiety disorder, unspecified: Secondary | ICD-10-CM

## 2022-10-17 DIAGNOSIS — F17210 Nicotine dependence, cigarettes, uncomplicated: Secondary | ICD-10-CM | POA: Diagnosis not present

## 2022-10-17 DIAGNOSIS — S72142A Displaced intertrochanteric fracture of left femur, initial encounter for closed fracture: Secondary | ICD-10-CM | POA: Diagnosis not present

## 2022-10-17 DIAGNOSIS — F17200 Nicotine dependence, unspecified, uncomplicated: Secondary | ICD-10-CM | POA: Diagnosis present

## 2022-10-17 DIAGNOSIS — I10 Essential (primary) hypertension: Secondary | ICD-10-CM | POA: Diagnosis not present

## 2022-10-17 DIAGNOSIS — R918 Other nonspecific abnormal finding of lung field: Secondary | ICD-10-CM | POA: Diagnosis not present

## 2022-10-17 DIAGNOSIS — I270 Primary pulmonary hypertension: Secondary | ICD-10-CM | POA: Diagnosis not present

## 2022-10-17 DIAGNOSIS — R2689 Other abnormalities of gait and mobility: Secondary | ICD-10-CM | POA: Diagnosis not present

## 2022-10-17 DIAGNOSIS — Z5941 Food insecurity: Secondary | ICD-10-CM

## 2022-10-17 DIAGNOSIS — N182 Chronic kidney disease, stage 2 (mild): Secondary | ICD-10-CM | POA: Diagnosis not present

## 2022-10-17 DIAGNOSIS — Z5982 Transportation insecurity: Secondary | ICD-10-CM

## 2022-10-17 DIAGNOSIS — Z832 Family history of diseases of the blood and blood-forming organs and certain disorders involving the immune mechanism: Secondary | ICD-10-CM

## 2022-10-17 DIAGNOSIS — J810 Acute pulmonary edema: Secondary | ICD-10-CM | POA: Diagnosis present

## 2022-10-17 DIAGNOSIS — M4312 Spondylolisthesis, cervical region: Secondary | ICD-10-CM | POA: Diagnosis not present

## 2022-10-17 DIAGNOSIS — I5033 Acute on chronic diastolic (congestive) heart failure: Secondary | ICD-10-CM

## 2022-10-17 DIAGNOSIS — I739 Peripheral vascular disease, unspecified: Secondary | ICD-10-CM | POA: Diagnosis not present

## 2022-10-17 DIAGNOSIS — Z953 Presence of xenogenic heart valve: Secondary | ICD-10-CM | POA: Diagnosis not present

## 2022-10-17 DIAGNOSIS — Z85528 Personal history of other malignant neoplasm of kidney: Secondary | ICD-10-CM

## 2022-10-17 DIAGNOSIS — Z79899 Other long term (current) drug therapy: Secondary | ICD-10-CM | POA: Diagnosis not present

## 2022-10-17 DIAGNOSIS — J9621 Acute and chronic respiratory failure with hypoxia: Secondary | ICD-10-CM | POA: Diagnosis present

## 2022-10-17 DIAGNOSIS — R0902 Hypoxemia: Secondary | ICD-10-CM | POA: Diagnosis not present

## 2022-10-17 DIAGNOSIS — N1831 Chronic kidney disease, stage 3a: Secondary | ICD-10-CM | POA: Diagnosis not present

## 2022-10-17 DIAGNOSIS — R0602 Shortness of breath: Secondary | ICD-10-CM | POA: Diagnosis not present

## 2022-10-17 DIAGNOSIS — D509 Iron deficiency anemia, unspecified: Secondary | ICD-10-CM | POA: Diagnosis not present

## 2022-10-17 DIAGNOSIS — I493 Ventricular premature depolarization: Secondary | ICD-10-CM | POA: Diagnosis present

## 2022-10-17 DIAGNOSIS — F322 Major depressive disorder, single episode, severe without psychotic features: Secondary | ICD-10-CM | POA: Diagnosis not present

## 2022-10-17 DIAGNOSIS — I959 Hypotension, unspecified: Secondary | ICD-10-CM | POA: Diagnosis not present

## 2022-10-17 DIAGNOSIS — Z743 Need for continuous supervision: Secondary | ICD-10-CM | POA: Diagnosis not present

## 2022-10-17 DIAGNOSIS — I6523 Occlusion and stenosis of bilateral carotid arteries: Secondary | ICD-10-CM | POA: Diagnosis not present

## 2022-10-17 DIAGNOSIS — I5032 Chronic diastolic (congestive) heart failure: Secondary | ICD-10-CM

## 2022-10-17 DIAGNOSIS — Z7951 Long term (current) use of inhaled steroids: Secondary | ICD-10-CM

## 2022-10-17 DIAGNOSIS — R531 Weakness: Secondary | ICD-10-CM | POA: Diagnosis not present

## 2022-10-17 HISTORY — DX: Personal history of (healed) traumatic fracture: Z87.81

## 2022-10-17 LAB — COMPREHENSIVE METABOLIC PANEL
ALT: 14 U/L (ref 0–44)
AST: 15 U/L (ref 15–41)
Albumin: 3.2 g/dL — ABNORMAL LOW (ref 3.5–5.0)
Alkaline Phosphatase: 62 U/L (ref 38–126)
Anion gap: 9 (ref 5–15)
BUN: 13 mg/dL (ref 8–23)
CO2: 27 mmol/L (ref 22–32)
Calcium: 8.9 mg/dL (ref 8.9–10.3)
Chloride: 102 mmol/L (ref 98–111)
Creatinine, Ser: 0.98 mg/dL (ref 0.44–1.00)
GFR, Estimated: >60 mL/min (ref >60)
Glucose, Bld: 213 mg/dL — ABNORMAL HIGH (ref 70–99)
Potassium: 4.1 mmol/L (ref 3.5–5.1)
Sodium: 138 mmol/L (ref 135–145)
Total Bilirubin: 0.8 mg/dL (ref 0.3–1.2)
Total Protein: 6 g/dL — ABNORMAL LOW (ref 6.5–8.1)

## 2022-10-17 LAB — I-STAT CHEM 8, ED
BUN: 15 mg/dL (ref 8–23)
Calcium, Ion: 1.19 mmol/L (ref 1.15–1.40)
Chloride: 101 mmol/L (ref 98–111)
Creatinine, Ser: 1 mg/dL (ref 0.44–1.00)
Glucose, Bld: 210 mg/dL — ABNORMAL HIGH (ref 70–99)
HCT: 38 % (ref 36.0–46.0)
Hemoglobin: 12.9 g/dL (ref 12.0–15.0)
Potassium: 4.1 mmol/L (ref 3.5–5.1)
Sodium: 139 mmol/L (ref 135–145)
TCO2: 29 mmol/L (ref 22–32)

## 2022-10-17 LAB — CBC WITH DIFFERENTIAL/PLATELET
Abs Immature Granulocytes: 0.08 10*3/uL — ABNORMAL HIGH (ref 0.00–0.07)
Basophils Absolute: 0 10*3/uL (ref 0.0–0.1)
Basophils Relative: 0 %
Eosinophils Absolute: 0 10*3/uL (ref 0.0–0.5)
Eosinophils Relative: 0 %
HCT: 38.6 % (ref 36.0–46.0)
Hemoglobin: 11.4 g/dL — ABNORMAL LOW (ref 12.0–15.0)
Immature Granulocytes: 1 %
Lymphocytes Relative: 5 %
Lymphs Abs: 0.7 10*3/uL (ref 0.7–4.0)
MCH: 24.2 pg — ABNORMAL LOW (ref 26.0–34.0)
MCHC: 29.5 g/dL — ABNORMAL LOW (ref 30.0–36.0)
MCV: 81.8 fL (ref 80.0–100.0)
Monocytes Absolute: 0.8 10*3/uL (ref 0.1–1.0)
Monocytes Relative: 6 %
Neutro Abs: 12.4 10*3/uL — ABNORMAL HIGH (ref 1.7–7.7)
Neutrophils Relative %: 88 %
Platelets: 241 10*3/uL (ref 150–400)
RBC: 4.72 MIL/uL (ref 3.87–5.11)
RDW: 23.6 % — ABNORMAL HIGH (ref 11.5–15.5)
WBC: 14 10*3/uL — ABNORMAL HIGH (ref 4.0–10.5)
nRBC: 0 % (ref 0.0–0.2)

## 2022-10-17 LAB — BLOOD GAS, VENOUS
Acid-Base Excess: 3.8 mmol/L — ABNORMAL HIGH (ref 0.0–2.0)
Bicarbonate: 29.2 mmol/L — ABNORMAL HIGH (ref 20.0–28.0)
O2 Saturation: 89.9 %
Patient temperature: 37.3
pCO2, Ven: 47 mmHg (ref 44–60)
pH, Ven: 7.41 (ref 7.25–7.43)
pO2, Ven: 60 mmHg — ABNORMAL HIGH (ref 32–45)

## 2022-10-17 LAB — TYPE AND SCREEN
ABO/RH(D): A NEG
Antibody Screen: NEGATIVE

## 2022-10-17 LAB — GLUCOSE, CAPILLARY: Glucose-Capillary: 253 mg/dL — ABNORMAL HIGH (ref 70–99)

## 2022-10-17 LAB — PROTIME-INR
INR: 2.3 — ABNORMAL HIGH (ref 0.8–1.2)
INR: 1.5 — ABNORMAL HIGH (ref 0.8–1.2)
Prothrombin Time: 25.6 s — ABNORMAL HIGH (ref 11.4–15.2)
Prothrombin Time: 18.5 s — ABNORMAL HIGH (ref 11.4–15.2)

## 2022-10-17 LAB — TROPONIN I (HIGH SENSITIVITY)
Troponin I (High Sensitivity): 20 ng/L — ABNORMAL HIGH (ref <18)
Troponin I (High Sensitivity): 19 ng/L — ABNORMAL HIGH (ref <18)

## 2022-10-17 LAB — BRAIN NATRIURETIC PEPTIDE: B Natriuretic Peptide: 165.7 pg/mL — ABNORMAL HIGH (ref 0.0–100.0)

## 2022-10-17 LAB — CBG MONITORING, ED: Glucose-Capillary: 154 mg/dL — ABNORMAL HIGH (ref 70–99)

## 2022-10-17 MED ORDER — DOCUSATE SODIUM 100 MG PO CAPS
100.0000 mg | ORAL_CAPSULE | Freq: Every day | ORAL | Status: DC
  Administered 2022-10-17 – 2022-10-22 (×5): 100 mg via ORAL
  Filled 2022-10-17 (×6): qty 1

## 2022-10-17 MED ORDER — DULOXETINE HCL 60 MG PO CPEP
60.0000 mg | ORAL_CAPSULE | Freq: Every day | ORAL | Status: DC
  Administered 2022-10-17 – 2022-10-22 (×6): 60 mg via ORAL
  Filled 2022-10-17 (×6): qty 1

## 2022-10-17 MED ORDER — ACETAMINOPHEN 500 MG PO TABS
1000.0000 mg | ORAL_TABLET | Freq: Four times a day (QID) | ORAL | Status: DC
  Administered 2022-10-17 – 2022-10-21 (×12): 1000 mg via ORAL
  Filled 2022-10-17 (×15): qty 2

## 2022-10-17 MED ORDER — METHOCARBAMOL 1000 MG/10ML IJ SOLN
500.0000 mg | Freq: Once | INTRAVENOUS | Status: AC
  Administered 2022-10-17: 500 mg via INTRAVENOUS
  Filled 2022-10-17: qty 500

## 2022-10-17 MED ORDER — DEXAMETHASONE SODIUM PHOSPHATE 4 MG/ML IJ SOLN
INTRAMUSCULAR | Status: DC | PRN
  Administered 2022-10-17: 10 mg via PERINEURAL

## 2022-10-17 MED ORDER — METHOCARBAMOL 1000 MG/10ML IJ SOLN
500.0000 mg | Freq: Once | INTRAMUSCULAR | Status: DC
Start: 2022-10-17 — End: 2022-10-17

## 2022-10-17 MED ORDER — ALPRAZOLAM 0.5 MG PO TABS: 0.5000 mg | ORAL_TABLET | Freq: Every evening | ORAL | Status: DC | PRN

## 2022-10-17 MED ORDER — BUDESONIDE 0.25 MG/2ML IN SUSP
0.2500 mg | Freq: Two times a day (BID) | RESPIRATORY_TRACT | Status: DC
  Administered 2022-10-17 – 2022-10-22 (×8): 0.25 mg via RESPIRATORY_TRACT
  Filled 2022-10-17 (×10): qty 2

## 2022-10-17 MED ORDER — IOHEXOL 350 MG/ML SOLN
75.0000 mL | Freq: Once | INTRAVENOUS | Status: AC | PRN
  Administered 2022-10-17: 75 mL via INTRAVENOUS

## 2022-10-17 MED ORDER — POLYETHYLENE GLYCOL 3350 17 G PO PACK
17.0000 g | PACK | Freq: Every day | ORAL | Status: DC | PRN
  Administered 2022-10-20: 17 g via ORAL
  Filled 2022-10-17: qty 1

## 2022-10-17 MED ORDER — ARFORMOTEROL TARTRATE 15 MCG/2ML IN NEBU
15.0000 ug | INHALATION_SOLUTION | Freq: Two times a day (BID) | RESPIRATORY_TRACT | Status: DC
  Administered 2022-10-17 – 2022-10-22 (×8): 15 ug via RESPIRATORY_TRACT
  Filled 2022-10-17 (×10): qty 2

## 2022-10-17 MED ORDER — FUROSEMIDE 10 MG/ML IJ SOLN
40.0000 mg | Freq: Once | INTRAMUSCULAR | Status: AC
  Administered 2022-10-17: 40 mg via INTRAVENOUS
  Filled 2022-10-17: qty 4

## 2022-10-17 MED ORDER — HYDROMORPHONE HCL 1 MG/ML IJ SOLN
1.0000 mg | INTRAMUSCULAR | Status: DC | PRN
  Administered 2022-10-18: 1 mg via INTRAVENOUS
  Filled 2022-10-17: qty 1

## 2022-10-17 MED ORDER — FENTANYL CITRATE PF 50 MCG/ML IJ SOSY
25.0000 ug | PREFILLED_SYRINGE | Freq: Once | INTRAMUSCULAR | Status: AC
  Administered 2022-10-17: 25 ug via INTRAVENOUS
  Filled 2022-10-17: qty 1

## 2022-10-17 MED ORDER — HYDROMORPHONE HCL 1 MG/ML IJ SOLN
1.0000 mg | Freq: Once | INTRAMUSCULAR | Status: AC
  Administered 2022-10-17: 1 mg via INTRAVENOUS
  Filled 2022-10-17: qty 1

## 2022-10-17 MED ORDER — BUPROPION HCL ER (XL) 150 MG PO TB24
150.0000 mg | ORAL_TABLET | ORAL | Status: DC
  Administered 2022-10-18 – 2022-10-22 (×4): 150 mg via ORAL
  Filled 2022-10-17 (×4): qty 1

## 2022-10-17 MED ORDER — METOPROLOL SUCCINATE ER 25 MG PO TB24
25.0000 mg | ORAL_TABLET | Freq: Every day | ORAL | Status: DC
  Administered 2022-10-18 – 2022-10-22 (×5): 25 mg via ORAL
  Filled 2022-10-17 (×5): qty 1

## 2022-10-17 MED ORDER — ALBUTEROL SULFATE (2.5 MG/3ML) 0.083% IN NEBU
2.5000 mg | INHALATION_SOLUTION | Freq: Four times a day (QID) | RESPIRATORY_TRACT | Status: DC
  Administered 2022-10-17 – 2022-10-18 (×2): 2.5 mg via RESPIRATORY_TRACT
  Filled 2022-10-17 (×2): qty 3

## 2022-10-17 MED ORDER — INSULIN GLARGINE-YFGN 100 UNIT/ML ~~LOC~~ SOLN
20.0000 [IU] | Freq: Every day | SUBCUTANEOUS | Status: DC
  Administered 2022-10-18: 20 [IU] via SUBCUTANEOUS
  Filled 2022-10-17: qty 0.2

## 2022-10-17 MED ORDER — ROSUVASTATIN CALCIUM 20 MG PO TABS
20.0000 mg | ORAL_TABLET | Freq: Every day | ORAL | Status: DC
  Administered 2022-10-17 – 2022-10-22 (×6): 20 mg via ORAL
  Filled 2022-10-17 (×6): qty 1

## 2022-10-17 MED ORDER — NICOTINE 7 MG/24HR TD PT24
7.0000 mg | MEDICATED_PATCH | Freq: Once | TRANSDERMAL | Status: DC
  Administered 2022-10-17: 7 mg via TRANSDERMAL
  Filled 2022-10-17: qty 1

## 2022-10-17 MED ORDER — REVEFENACIN 175 MCG/3ML IN SOLN
175.0000 ug | Freq: Every day | RESPIRATORY_TRACT | Status: DC
  Administered 2022-10-18 – 2022-10-22 (×2): 175 ug via RESPIRATORY_TRACT
  Filled 2022-10-17 (×6): qty 3

## 2022-10-17 MED ORDER — SODIUM CHLORIDE 0.9 % IV SOLN
500.0000 mg | INTRAVENOUS | Status: AC
  Administered 2022-10-17 – 2022-10-19 (×3): 500 mg via INTRAVENOUS
  Filled 2022-10-17 (×3): qty 5

## 2022-10-17 MED ORDER — ORAL CARE MOUTH RINSE: 15.0000 mL | OROMUCOSAL | Status: DC | PRN

## 2022-10-17 MED ORDER — ROPIVACAINE HCL 5 MG/ML IJ SOLN
INTRAMUSCULAR | Status: DC | PRN
  Administered 2022-10-17: 30 mL via PERINEURAL

## 2022-10-17 MED ORDER — NICOTINE 14 MG/24HR TD PT24
14.0000 mg | MEDICATED_PATCH | Freq: Every day | TRANSDERMAL | Status: DC
  Administered 2022-10-20 – 2022-10-22 (×3): 14 mg via TRANSDERMAL
  Filled 2022-10-17 (×3): qty 1

## 2022-10-17 MED ORDER — ONDANSETRON HCL 4 MG/2ML IJ SOLN
4.0000 mg | Freq: Once | INTRAMUSCULAR | Status: AC
  Administered 2022-10-17: 4 mg via INTRAVENOUS
  Filled 2022-10-17: qty 2

## 2022-10-17 MED ORDER — HYDROMORPHONE HCL 1 MG/ML IJ SOLN
0.5000 mg | Freq: Once | INTRAMUSCULAR | Status: AC
  Administered 2022-10-17: 0.5 mg via INTRAVENOUS
  Filled 2022-10-17: qty 1

## 2022-10-17 MED ORDER — METHYLPREDNISOLONE SODIUM SUCC 125 MG IJ SOLR
125.0000 mg | Freq: Once | INTRAMUSCULAR | Status: AC
  Administered 2022-10-17: 125 mg via INTRAVENOUS
  Filled 2022-10-17: qty 2

## 2022-10-17 MED ORDER — PREDNISONE 20 MG PO TABS
40.0000 mg | ORAL_TABLET | Freq: Every day | ORAL | Status: DC
  Administered 2022-10-18 – 2022-10-22 (×4): 40 mg via ORAL
  Filled 2022-10-17 (×4): qty 2

## 2022-10-17 MED ORDER — INSULIN ASPART 100 UNIT/ML IJ SOLN
0.0000 [IU] | Freq: Three times a day (TID) | INTRAMUSCULAR | Status: DC
  Administered 2022-10-18: 5 [IU] via SUBCUTANEOUS
  Administered 2022-10-18: 8 [IU] via SUBCUTANEOUS
  Administered 2022-10-18: 11 [IU] via SUBCUTANEOUS
  Administered 2022-10-19: 2 [IU] via SUBCUTANEOUS
  Administered 2022-10-20 (×3): 3 [IU] via SUBCUTANEOUS
  Administered 2022-10-21: 11 [IU] via SUBCUTANEOUS
  Administered 2022-10-21 (×2): 3 [IU] via SUBCUTANEOUS
  Administered 2022-10-22 (×3): 5 [IU] via SUBCUTANEOUS

## 2022-10-17 MED ORDER — OXYCODONE HCL 5 MG PO TABS
10.0000 mg | ORAL_TABLET | Freq: Four times a day (QID) | ORAL | Status: DC | PRN
  Administered 2022-10-17 – 2022-10-18 (×3): 10 mg via ORAL
  Filled 2022-10-17 (×3): qty 2

## 2022-10-17 MED ORDER — VITAMIN K1 10 MG/ML IJ SOLN
5.0000 mg | Freq: Once | INTRAVENOUS | Status: AC
  Administered 2022-10-17: 5 mg via INTRAVENOUS
  Filled 2022-10-17: qty 0.5

## 2022-10-17 MED ORDER — IPRATROPIUM-ALBUTEROL 0.5-2.5 (3) MG/3ML IN SOLN
3.0000 mL | Freq: Once | RESPIRATORY_TRACT | Status: DC
  Filled 2022-10-17: qty 3

## 2022-10-17 MED ORDER — GABAPENTIN 600 MG PO TABS: 600.0000 mg | ORAL_TABLET | Freq: Every day | ORAL | Status: DC

## 2022-10-17 MED ORDER — GABAPENTIN 300 MG PO CAPS
600.0000 mg | ORAL_CAPSULE | Freq: Every day | ORAL | Status: DC
  Administered 2022-10-17 – 2022-10-22 (×6): 600 mg via ORAL
  Filled 2022-10-17 (×6): qty 2

## 2022-10-17 NOTE — Hospital Course (Signed)
[name] was admitted because she fell asleep on her chair, fell and was found to have a LEFT intertrochanteric hip fracture.   Closed left intertrochanteric hip fracture PS/p IMN on 9/8. Surgical scar has been healing well. Pain has been somewhat difficult to control. She is to:   -Hydrocodone-acetaminophen 5-325mg  2 tablets q6 hours   She is Vitamin D deficient. Please start: -Vitamin D supplementation at 1000 international units daily -encourage PO Ca intake -Will need follow up with Ortho in 14 days for incision check and XR  -please ensure incision remains clean and dry, free of infection.    Atrial fibrillation Status post MVR with bioprosthetic mitral valve History of embolism 2/2 heart procedure  Warfarin was held for surgery s/p Vitamin K. INR 2.3 at admission decreased to 1.5. She is now on enoxaparin as a bridge to restart her Warfarin. She restarted her warfarin but INR is NOT at goal. INR is 1.1 today.  - continue bridging until INR at goal of 2-2.5 -give warfarin dose of 2.5mg  Tue/Fri/Sun and 1.25mg  all other days  - FU with INR and titrate warfarin PRN - Continue metoprolol XL 25 mg daily    COPD with exacerbation Reported increased sputum but not changed in color (yellow white), SOB, requiring O2 (none at baseline just during her sleep with CPAP) at the time of her admission. She was not grossly volume overloaded.  CTA chest without PE but does show some interstitial infiltrates. [name] was treated with bronchodilators, prednisone, azithromycin for COPD exacerbation with sputum. S/p 1 dose of IV Lasix to treat concomitant subclinical heart failure syndrome.  Given no signs of infection there was a low suspicion for community-acquired pneumonia.  -Continue with Duoneb Inhale 3 mLs by nebulization every 6 (six) hours as needed (shortness of breath, wheezing)  -Continue with Atrovent Instill 2 sprays into the nose 3 (three) times daily -Use  albuterol for rescue. Inhale 2 puffs into the lungs every 6 (six) hours as needed for shortness of breath.    Chronic congestive heart failure Well compensated with no signs of volume overload during her hospitalization. Lasix was held due to a light increased Cr which is now at baseline. She did not receive her home dose furosemide during the rest of her hospitalization since her volume status has remained low.  She takes 80mg  daily. She is to resume taking this along with oral potassium, but please titrate if this dose is too high for her.  -Lasix 80 mg every morning. -Reassess for volume status and adjust dose PRN -Consider BMP -Metoprolol succinate 25mg  daily   Elevated Cr Baseline is ~0.98-1.0 . At baseline today.  -Consider following up with BMP    Diabetes on insulin Glucose well-controlled at present. Tresiba 44 units daily at home. Was on a SSI due to prednisone. She is to continue with: -Tresiba 44 units daily  -Semaglutide 1 mg daily    Encephalopathy Fall Delirium There were times she thought that someone was coming into her room to cut the tubes of her CPAP. This would resolve overnight. Continue to monitor.    OSA  Chronic and stable. Cw with CPAP with bleed O2. Cw same.     Severe major depression Anxiety Continue home medications. - Duloxetine, bupropion - 0.5 to 1 mg Xanax as needed at bedtime   Peripheral arterial disease Lower extremities bilaterally with decent perfusion on exam today. - Continue rosuvastatin   Tobacco use disorder Seems precontemplative regarding quitting. - Nicotine  replacement   Constipation  Linzess 72mg  daily , Miralax daily PRN, colace daily PRN

## 2022-10-17 NOTE — ED Triage Notes (Signed)
Pt BIB EMS after falling out of chair on left side, having 10/10 left hip pain, rotation and shortening in left leg. On coumadin. EMS gave of Fentanyl. Hx of COPD, not normally on oxygen at home but on 6L up to 92%. Aox4.

## 2022-10-17 NOTE — ED Notes (Signed)
Pt asked for pain medication.  Provider notified face to face. Agreed to prescribe pain medication until Robaxin arrives. Provider also notified that the PT was no longer [facility] and that the duo neb could not be administered while the PT was laying flat (Pt needed to remain flat due to broken hip).Provider stated to hold the duo neb.

## 2022-10-17 NOTE — Anesthesia Preprocedure Evaluation (Signed)
Anesthesia Evaluation  Patient identified by MRN, date of birth, ID band Patient awake    Reviewed: Allergy & Precautions, NPO status , Patient's Chart, lab work & pertinent test results, reviewed documented beta blocker date and time   History of Anesthesia Complications Negative for: history of anesthetic complications  Airway Mallampati: III  TM Distance: >3 FB     Dental   Pulmonary shortness of breath, asthma , sleep apnea , pneumonia, COPD,  oxygen dependent, Current Smoker          Cardiovascular hypertension, (-) angina + Peripheral Vascular Disease and +CHF  (-) CAD and (-) Past MI  Rhythm:Regular Rate:Normal     Neuro/Psych  Headaches PSYCHIATRIC DISORDERS Anxiety Depression     Neuromuscular disease    GI/Hepatic hiatal hernia,GERD  ,,  Endo/Other  diabetes, Type 2    Renal/GU Renal disease     Musculoskeletal  (+) Arthritis ,  Fibromyalgia -  Abdominal   Peds  Hematology  (+) Blood dyscrasia, anemia   Anesthesia Other Findings   Reproductive/Obstetrics                             Anesthesia Physical Anesthesia Plan  ASA: 3  Anesthesia Plan: Regional   Post-op Pain Management:    Induction:   PONV Risk Score and Plan:   Airway Management Planned:   Additional Equipment:   Intra-op Plan:   Post-operative Plan:   Informed Consent: I have reviewed the patients History and Physical, chart, labs and discussed the procedure including the risks, benefits and alternatives for the proposed anesthesia with the patient or authorized representative who has indicated his/her understanding and acceptance.       Plan Discussed with:   Anesthesia Plan Comments:        Anesthesia Quick Evaluation

## 2022-10-17 NOTE — H&P (Cosign Needed Addendum)
Date: 10/17/2022         Patient Name:  [name] MRN: [id number]  DOB: 29-Aug-1950 Age / Sex: 72 y.o., female   PCP: Miguel Aschoff, MD         Medical Service: Internal Medicine Teaching Service         Attending Physician: Dr. Dickie La, MD    First Contact: Dr. Hassan Rowan Pager: [phone]  Second Contact: Dr. Benito Mccreedy Pager: [phone]       After Hours (After 5p/  First Contact Pager: [phone]  weekends / holidays): Second Contact Pager: [phone]   Chief Concern: Fall from kitchen chair  History of Present Illness: 72 year old female with COPD, paroxysmal atrial fibrillation, chronic congestive heart failure, and obstructive sleep apnea presents after she fell asleep at her kitchen table causing her to fall out of her chair onto her left hip.  This is unusual for her.  She sometimes stumbles and falls while she is up and walking around, but never or very rarely from sitting.  The morning of this event, she does not report anything amiss.  She says that she was in her usual state of health.  After she fell she was unable to get herself off the ground for a couple of hours before she was able to notify EMS.  Review of Systems  Constitutional:  Negative for chills, fever, malaise/fatigue and weight loss.  Respiratory:  Positive for cough, sputum production and wheezing. Negative for shortness of breath.   Cardiovascular:  Negative for chest pain, palpitations and leg swelling.  Gastrointestinal:  Negative for abdominal pain, constipation, diarrhea, nausea and vomiting.  Genitourinary:  Negative for dysuria and urgency.  Musculoskeletal:  Positive for falls and joint pain.  Neurological:  Negative for dizziness and loss of consciousness.  Psychiatric/Behavioral:  Negative for substance abuse.    Notably, increased cough has been ongoing for a few days.  Associated with sputum production more than usual.  Described as white to yellow.  In the ED she was  found to have a intertrochanteric fracture of left proximal femur.  She was also treated with IV methylprednisolone for wheezing.  Surgery was consulted from ED and they plan to do intramedullary nailing tomorrow.  Allergies: Allergies  Allergen Reactions   Dexilant [Dexlansoprazole] Other (See Comments)    Made acid reflux worse   Lorazepam Other (See Comments)    Patient's sister noted that ativan caused the patient to become extremely confused during hospitalization 09/2010; tolerates Xanax   Oxycontin [Oxycodone] Other (See Comments)    headache   Tramadol Hcl Swelling    Ankle swelling   Past Medical History: Insulin-dependent diabetes, COPD, aortic atherosclerosis, paroxysmal A-fib, pulmonary hypertension, chronic congestive heart failure, severe major depression, peripheral arterial disease, obstructive sleep apnea, clear-cell renal cell carcinoma in remission.  Medications: Current Outpatient Medications  Medication Instructions   albuterol (PROAIR HFA) 108 (90 Base) MCG/ACT inhaler 2 puffs, Inhalation, Every 6 hours PRN   ALPRAZolam (XANAX) 1 MG tablet Take 1/2 - 1 tablet (1/2  - 1 mg total) by mouth at bedtime as needed for anxiety or sleep. May take 1/2 tablet during the day if needed for anxiety   buPROPion (WELLBUTRIN XL) 150 mg, Oral, BH-each morning   cetirizine (ZYRTEC ALLERGY) 10 mg, Oral, Daily   Continuous Glucose Sensor (FREESTYLE LIBRE 3 SENSOR) MISC Place 1 sensor on the skin every 14 days. Use to check glucose 6 times daily as directed   DULoxetine (CYMBALTA) 60  mg, Oral, 2 times daily   Fluticasone-Umeclidin-Vilant (TRELEGY ELLIPTA) 100-62.5-25 MCG/ACT AEPB 1 puff, Inhalation, Daily   furosemide (LASIX) 80 mg, Oral, Every morning   gabapentin (NEURONTIN) 600 mg, Oral, Daily at bedtime   HYDROcodone-acetaminophen (NORCO/VICODIN) 5-325 MG tablet 1-2 tablets, Oral, 3 times daily   ipratropium (ATROVENT) 0.03 % nasal spray Instill 2 sprays into the nose 3 (three)  times daily as directed   ipratropium-albuterol (DUONEB) 0.5-2.5 (3) MG/3ML SOLN Inhale 3 mLs by nebulization every 6 (six) hours as needed (shortness of breath, wheezing).   ketoconazole (NIZORAL) 2 % cream Apply 1 application topically at bedtime as needed for irritation.   lidocaine-prilocaine (EMLA) cream 1 Application, Topical, As needed   Linzess 72 mcg, Oral, Daily before breakfast   metoprolol succinate (TOPROL XL) 25 mg, Oral, Daily   ondansetron (ZOFRAN) 4 mg, Oral, Every 8 hours PRN   OXYGEN 2 L, Inhalation, Nightly   Ozempic (1 MG/DOSE) 1 mg, Subcutaneous, Weekly   potassium chloride SA (KLOR-CON M) 20 MEQ tablet 20 mEq, Oral, Daily   rosuvastatin (CRESTOR) 20 mg, Oral, Daily at bedtime   Tresiba FlexTouch 44 Units, Subcutaneous, Daily   warfarin (COUMADIN) 2.5 MG tablet Take 1 tablet by mouth daily unless otherwise instructed.   Reports good adherence to home medications.  States that she takes her duloxetine daily rather than twice daily as listed.  Surgical History: No recent surgeries.  Family History:  Family history reviewed, noncontributory.  Social History:  Lives at home.  When she is feeling well she enjoys doing "nothing."  Smokes 1/2 to 1 pack/day.  Does not drink alcohol or use drugs.  Physical Exam: Blood pressure (!) 113/50, pulse 96, temperature 98.3 F (36.8 C), temperature source Oral, resp. rate 19, height 5\' 2"  (1.575 m), weight 73 kg, SpO2 95%.  No apparent distress, tired appearing Oral mucous membranes are moist Heart rate is borderline tachycardic, rhythm is regular, radial pulses are strong, no lower extremity edema, sluggish lower extremity capillary refill Breathing is regular with supplemental O2 via nasal cannula and audible wheezing at bedside, diminished lung sounds throughout, no appreciable crackles Abdomen is nontender nondistended Skin is warm and dry Alert and oriented, answers questions appropriately, follows simple instructions, no  facial asymmetry, tongue midline, symmetric palatal elevation, no pronator drift, strength grossly intact in all 4 extremities Pleasant with congruent affect, somewhat inattentive  EKG:  Sinus rhythm with PVCs  Labs: VBG 7.41/47 Electrolytes and bicarb okay Liver enzymes within normal limits BNP 165.7 Troponin 20 right ear 19 WBC 14 PT/INR 25.6/2.3  Images and other studies: CT of the chest with diffuse interstitial infiltrates and tiny right pleural effusion CT cervical spine with anterolisthesis at C5-6  CT head without acute intracranial abnormality CT abdomen/pelvis with comminuted intertrochanteric left hip fracture, no intra-abdominal injury, right nephrolithiasis, tiny right pleural effusion with mild right interstitial infiltrates   Assessment & Plan:  [name] is a 72 y.o. with COPD, chronic congestive heart failure, among other comorbidities, who presents after falling out of her chair at home and fracturing her left hip, with concomitant hypoxic respiratory failure.  Principal Problem:   Closed left hip fracture, initial encounter [facility]) Active Problems:   Severe major depression (HCC)   Peripheral arterial disease (HCC)   Type 2 diabetes mellitus with other specified complication (HCC)   Chronic CHF (congestive heart failure) (HCC)   PAF (paroxysmal atrial fibrillation) (HCC)   Tobacco use disorder   COPD exacerbation (HCC)   Encephalopathy  OSA (obstructive sleep apnea)  Close left hip fracture Fragility fracture in this somewhat frail elderly person.  Ortho will take for IMN tomorrow morning.  Vitamin D level pending.  Anticipate vitamin D and calcium supplementation and bisphosphonate at some point. - Appreciate Ortho assistance with this case - As needed oxycodone and Dilaudid  Acute hypoxic respiratory failure COPD exacerbation Chronic CHF Stable on low-flow nasal cannula.  Query COPD exacerbation versus mildly decompensated chronic  congestive heart failure.  On exam, her lung sounds are diminished without apparent wheezing or crackles.  She is not grossly volume overloaded.  CTA chest without PE but does show some interstitial infiltrates.  Treat with bronchodilators, prednisone, azithromycin for COPD exacerbation with sputum.  1 dose of IV Lasix to treat concomitant subclinical heart failure syndrome.  Low suspicion for community-acquired pneumonia at present. - Azithromycin 500 mg x 3 days - Prednisone 40 mg x 5 days - Revefenacin, formoterol, budesonide via neb - Albuterol via neb every 6 hours while awake - Furosemide 40 mg IV x 1 - Admit to med telemetry  Mild encephalopathy Larey Seat out of her kitchen chair at home after falling asleep at the table, which is unusual for her.  She is somewhat inattentive on my exam.  VBG to evaluate for hypercapnia in this person with COPD and OSA, this was normal.  Wonder about hypoxic encephalopathy.  Does not sound like syncope or seizure.  Again, doubt sepsis. - Delirium precautions  Type 2 diabetes on insulin Continue half dose home glargine while n.p.o. for surgery - Glargine 20 units tomorrow a.m.  Paroxysmal A-fib Sinus rhythm at present.  Vitamin K for warfarin reversal in preparation for surgery tomorrow.  Severe major depression Anxiety Continue home medications. - Duloxetine, bupropion - 0.5 to 1 mg Xanax as needed at bedtime  Peripheral arterial disease Lower extremities bilaterally with decent perfusion on exam today. - Continue rosuvastatin  Tobacco use disorder Seems precontemplative regarding quitting. - Nicotine replacement  Level of care: Med telemetry Diet: N.p.o. at midnight IVF: N/A VTE: SCDs Start: 10/17/22 1637 Code: Full Surrogate: Daughter, Theodis Sato  Signed: Marrianne Mood MD 10/17/2022, 8:50 PM

## 2022-10-17 NOTE — Consult Note (Signed)
Reason for Consult:Left hip fx Referring Physician: Elayne Horn Time called: 1320 Time at bedside: 1322   [name] is an 72 y.o. female.  HPI: [name] fell asleep at the kitchen table this morning and fell off her chair onto the floor. She had immediate left hip pain and could not get up. She was brought to the ED where x-rays showed a left hip fx and orthopedic surgery was consulted. She lives at home alone and occasionally uses a cane for ambulation.  Past Medical History:  Diagnosis Date   Abnormality of lung on CXR 02/02/2020   Nonspecific finding on CXR ordered by pulmonologist - c/w inflammation vs infection, f/u imaging suggested, Dr. Roxy Cedar office has been in communication about recommended next steps.   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   Non-bleeding when visualized on capsule endoscopy 06/30/2015    Arthritis    "lower back; hands" (02/19/2018)   Asthma    Asymptomatic cholelithiasis 09/25/2015   Seen on CT scan 08/2015   Carotid artery stenosis; s/p R endarterectomy    s/p right endarterectomy (06/2010) Carotid US (07/2010):  Left: Moderate-to-severe (60-79%) calcific and non-calcific plaque origin and proximal ICA and ECA    Chronic congestive heart failure with left ventricular diastolic dysfunction (HCC) 10/21/2010   Chronic constipation 02/03/2011   Chronic daily headache 01/16/2014   Chronic iron deficiency anemia    Chronic low back pain 10/06/2012   Chronic venous insufficiency 08/04/2012   Closed fracture of one rib of left side 08/23/2021   ED visit after a fall 08/16/21    COPD (chronic obstructive pulmonary disease) with emphysema (HCC)    PFTs 2018: severe obstructive disease, insignif response to bronchiodilator, mild restriction parenchymal pattern, moderately severe diffusion defect. 2014  FEV1 0.92 (40%),  ratio 69, 27% increase in FEV1 with BD, TLC 91%, severe airtrapping, DLCO49% On chronic home O2. Pulmonary rehab referral 05/2012    Dyspnea    Fibromyalgia 08/29/2010   Gastroesophageal reflux disease    History of blood transfusion    "several times"  (02/19/2018)   History of clear cell renal cell carcinoma (HCC), in remission 07/21/2011   s/p cryoablation of left RCC in 09/2011 by Dr. Fredia Sorrow. Followed by Dr. Retta Diones  [facility]) .     History of hiatal hernia    History of mitral valve replacement with bioprosthetic valve due to mitral stenosis 2012   s/p MVR with a 27-mm pericardial porcine valve (Medtronic Mosaic valve, serial #[id number] on 09/20/10, Dr. Donata Clay)    History of obstructive sleep apnea, resolved 2013   resolved per sleep study 07/2019; no apnea, but did have desaturation.  CPAP no longer necessary.  Nocturnal polysomnography (06/2009): Moderate sleep apnea/ hypopnea syndrome , AHI 17.8 per hour with nonpositional hypopneas. CPAP titration to 12 CWP, AHI 2.4 per hour. On nocturnal CPAP via a small resMed Quattro full-face mask with heated humidifier.    History of pneumonia    "once"  (02/19/2018)   History of seborrheic keratosis 09/28/2015   History of stroke without residual deficits 01/17/2022   Hyperlipidemia LDL goal < 100 11/20/2005   Internal and external hemorrhoids without complication 08/04/2012   Lesion of left native kidney 06/01/2020   Incidental finding on recent screening chest CT for lung ca ordered by Dr. Maple Hudson pulmonology - he has ordered a f/u renal U/S.    Lichen sclerosus  of female genitalia 01/12/2017   Migraine    "none in years" (02/19/2018)   Mild cognitive impairment with memory loss 12/23/2021   Moderately severe major depression (HCC) 11/19/2005   Nocturnal hypoxia per sleep study 07/2019    Osteoporosis    DEXA 2016: T -2.7; DEXA (12/09/2011): L-spine T -3.7, left hip T -1.4 DEXA (12/2004): L-spine T -2.6, left hip -0.1     Paroxysmal atrial fibrillation (HCC) 10/22/2010   s/p Left atrial maze procedure for paroxysmal atrial fibrillation on 09/20/2010 by Dr Donata Clay.  Subsequent splenic infarct, decision was made to re-anticoagulate with coumadin, likely life-long as this is the most likely cause of the splenic infarct.    Personal history of colonic polyps 05/14/2011   Colonoscopy (05/2011): 4 mm adenomatous polyp excised endoscopically Colonoscopy (02/2002): Adenomatous polyp excised endoscopically    Personal history of renal cell carcinoma 09/12/2020   Pneumonia    Pulmonary hypertension due to chronic obstructive pulmonary disease (HCC) 04/25/2016   2014 TEE w PA peak pressure 46 mmHg, s/p MV replacement    Right nephrolithiasis, asymptomatic, incidental finding 09/06/2014   5 mm non-obstructing calculus seen on CT scan 09/05/2014    Right ventricular failure (HCC) 04/25/2016   Severe obesity (BMI 35.0-39.9) with comorbidity (HCC) 10/23/2011   Sleep apnea    Stage 2 chronic kidney disease due to type 2 diabetes mellitus (HCC) 12/16/2018   CKD stage III     Tobacco abuse 07/28/2012   Type 2 diabetes mellitus with diabetic neuropathy [facility])     Past Surgical History:  Procedure Laterality Date   BIOPSY  07/19/2021   Procedure: BIOPSY;  Surgeon: Vida Rigger, MD;  Location: [facility] ENDOSCOPY;  Service: Gastroenterology;;   CARDIAC CATHETERIZATION     CARDIAC VALVE REPLACEMENT  Aug. 2012   "mitral valve"   CAROTID ENDARTERECTOMY Right 07/04/2010   by Dr. Myra Gianotti for asymptomatic right carotid artery stenosis   CATARACT EXTRACTION W/ INTRAOCULAR LENS  IMPLANT, BILATERAL Bilateral    CHEST TUBE INSERTION  09/24/2010   Dr Zenaida Niece Trigt   COLONOSCOPY  05/12/2011   performed by Dr. Bosie Clos. Showing small internal hemorrhoids, single tubular adenoma polyp   COLONOSCOPY WITH PROPOFOL N/A 05/29/2020   Procedure: COLONOSCOPY WITH PROPOFOL;  Surgeon: Vida Rigger, MD;  Location: [facility];  Service: Endoscopy;  Laterality: N/A;    COLONOSCOPY WITH PROPOFOL N/A 07/19/2021   Procedure: COLONOSCOPY WITH PROPOFOL;  Surgeon: Vida Rigger, MD;  Location: [facility];  Service: Gastroenterology;  Laterality: N/A;   CRYOABLATION Left 09/2011   by Dr. Fredia Sorrow. Followed by Dr. Retta Diones  [facility]) .     DILATION AND CURETTAGE OF UTERUS     ESOPHAGOGASTRODUODENOSCOPY  05/12/2011   performed by Dr. Bosie Clos. Negative for ulcerations, biopsy negative for evidence of celiac sprue   ESOPHAGOGASTRODUODENOSCOPY N/A 06/29/2015   Procedure: ESOPHAGOGASTRODUODENOSCOPY (EGD);  Surgeon: Vida Rigger, MD;  Location: [facility] ENDOSCOPY;  Service: Endoscopy;  Laterality: N/A;   ESOPHAGOGASTRODUODENOSCOPY N/A 03/29/2016   Procedure: ESOPHAGOGASTRODUODENOSCOPY (EGD);  Surgeon: Vida Rigger, MD;  Location: [facility] ENDOSCOPY;  Service: Endoscopy;  Laterality: N/A;   ESOPHAGOGASTRODUODENOSCOPY (EGD) WITH PROPOFOL N/A 05/29/2020   Procedure: ESOPHAGOGASTRODUODENOSCOPY (EGD) WITH PROPOFOL;  Surgeon: Vida Rigger, MD;  Location: [facility];  Service: Endoscopy;  Laterality: N/A;   FRACTURE SURGERY     GIVENS CAPSULE STUDY N/A 06/30/2015   Procedure: GIVENS CAPSULE STUDY;  Surgeon: Vida Rigger, MD;  Location: [facility] ENDOSCOPY;  Service: Endoscopy;  Laterality: N/A;   GIVENS CAPSULE STUDY N/A 06/29/2015  Procedure: GIVENS CAPSULE STUDY;  Surgeon: Vida Rigger, MD;  Location: [facility] ENDOSCOPY;  Service: Endoscopy;  Laterality: N/A;   HEMORRHOID SURGERY  1970s?   "lanced"   HYSTEROSCOPY W/ ENDOMETRIAL ABLATION  06/2001   for persistent post-menopausal bleeding // by S. Kyra Manges, M.D.   IR GENERIC HISTORICAL  08/23/2015   IR RADIOLOGIST EVAL & MGMT 08/23/2015 Irish Lack, MD [facility] INTERV RAD   IR GENERIC HISTORICAL  04/09/2016   IR RADIOLOGIST EVAL & MGMT 04/09/2016 Irish Lack, MD [facility] INTERV RAD   IR RADIOLOGIST EVAL & MGMT  10/07/2016   IR RADIOLOGIST EVAL & MGMT  06/25/2017   IR RADIOLOGIST EVAL & MGMT  07/31/2020   IR RADIOLOGIST EVAL & MGMT  10/02/2020   IR  RADIOLOGIST EVAL & MGMT  12/20/2020   IR RADIOLOGIST EVAL & MGMT  12/25/2021   LEFT HEART CATH AND CORONARY ANGIOGRAPHY N/A 04/21/2016   Procedure: Left Heart Cath and Coronary Angiography;  Surgeon: Runell Gess, MD;  Location: [facility] INVASIVE CV LAB;  Service: Cardiovascular;  Laterality: N/A;   LIPOMA EXCISION  08/2005   occipital lipoma 1.5cm - by Dr. Maryagnes Amos   LITHOTRIPSY  ~ 2000   MAZE Left 09/20/10   for paroxysmal atrial fibrillation (Dr. Donata Clay)   MITRAL VALVE REPLACEMENT  09/20/10    with a 27-mm pericardial porcine valve (Medtronic Mosaic valve, serial #[id number]). 09/20/10, Dr Donata Clay   ORIF CLAVICLE FRACTURE Right 01/2004   by Veverly Fells. Ophelia Charter, M.D for Right clavicle nonunion.; "it's got a pin in it"   POLYPECTOMY  05/29/2020   Procedure: POLYPECTOMY;  Surgeon: Vida Rigger, MD;  Location: [facility] ENDOSCOPY;  Service: Endoscopy;;   POLYPECTOMY  07/19/2021   Procedure: POLYPECTOMY;  Surgeon: Vida Rigger, MD;  Location: [facility];  Service: Gastroenterology;;   RADIOLOGY WITH ANESTHESIA Left 09/12/2020   Procedure: RADIOLOGY WITH ANESTHESIA- RENAL CRYOABLATION;  Surgeon: Irish Lack, MD;  Location: [facility] ORS;  Service: Radiology;  Laterality: Left;   REFRACTIVE SURGERY Bilateral    RIGHT HEART CATH N/A 04/23/2016   Procedure: Right Heart Cath;  Surgeon: Laurey Morale, MD;  Location: [facility] INVASIVE CV LAB;  Service: Cardiovascular;  Laterality: N/A;   TONSILLECTOMY     TUBAL LIGATION      Family History  Problem Relation Age of Onset   Peptic Ulcer Disease Father    Heart attack Father 81       Died of MI at age 24   Heart attack Brother 100       Died of MI at age 87   Obesity Brother    Pneumonia Mother    Healthy Sister    Lupus Daughter    Obsessive Compulsive Disorder Daughter     Social History:  reports that she has been smoking cigarettes. She started smoking about 60 years ago. She has a 60 pack-year smoking history. She has never used smokeless tobacco. She reports that  she does not currently use alcohol. She reports that she does not currently use drugs after having used the following drugs: Marijuana.  Allergies:  Allergies  Allergen Reactions   Dexilant [Dexlansoprazole] Other (See Comments)    Made acid reflux worse   Lorazepam Other (See Comments)    Patient's sister noted that ativan caused the patient to become extremely confused during hospitalization 09/2010; tolerates Xanax   Oxycontin [Oxycodone] Other (See Comments)    headache   Tramadol Hcl Swelling    Ankle swelling    Medications: I have  reviewed the patient's current medications.  Results for orders placed or performed during the hospital encounter of 10/17/22 (from the past 48 hour(s))  CBC with Differential     Status: Abnormal   Collection Time: 10/17/22 11:50 AM  Result Value Ref Range   WBC 14.0 (H) 4.0 - 10.5 K/uL   RBC 4.72 3.87 - 5.11 MIL/uL   Hemoglobin 11.4 (L) 12.0 - 15.0 g/dL   HCT 16.1 09.6 - 04.5 %   MCV 81.8 80.0 - 100.0 fL   MCH 24.2 (L) 26.0 - 34.0 pg   MCHC 29.5 (L) 30.0 - 36.0 g/dL   RDW 40.9 (H) 81.1 - 91.4 %   Platelets 241 150 - 400 K/uL   nRBC 0.0 0.0 - 0.2 %   Neutrophils Relative % 88 %   Neutro Abs 12.4 (H) 1.7 - 7.7 K/uL   Lymphocytes Relative 5 %   Lymphs Abs 0.7 0.7 - 4.0 K/uL   Monocytes Relative 6 %   Monocytes Absolute 0.8 0.1 - 1.0 K/uL   Eosinophils Relative 0 %   Eosinophils Absolute 0.0 0.0 - 0.5 K/uL   Basophils Relative 0 %   Basophils Absolute 0.0 0.0 - 0.1 K/uL   Immature Granulocytes 1 %   Abs Immature Granulocytes 0.08 (H) 0.00 - 0.07 K/uL    Comment: Performed at [facility], [address], [place], [place] [address]  Comprehensive metabolic panel     Status: Abnormal   Collection Time: 10/17/22 11:50 AM  Result Value Ref Range   Sodium 138 135 - 145 mmol/L   Potassium 4.1 3.5 - 5.1 mmol/L   Chloride 102 98 - 111 mmol/L   CO2 27 22 - 32 mmol/L   Glucose, Bld 213 (H) 70 - 99 mg/dL    Comment: Glucose reference range  applies only to samples taken after fasting for at least 8 hours.   BUN 13 8 - 23 mg/dL   Creatinine, Ser 6.21 0.44 - 1.00 mg/dL   Calcium 8.9 8.9 - 30.8 mg/dL   Total Protein 6.0 (L) 6.5 - 8.1 g/dL   Albumin 3.2 (L) 3.5 - 5.0 g/dL   AST 15 15 - 41 U/L   ALT 14 0 - 44 U/L   Alkaline Phosphatase 62 38 - 126 U/L   Total Bilirubin 0.8 0.3 - 1.2 mg/dL   GFR, Estimated >65 >78 mL/min    Comment: (NOTE) Calculated using the CKD-EPI Creatinine Equation (2021)    Anion gap 9 5 - 15    Comment: Performed at [facility], [address], [place], [place] [address]  Troponin I (High Sensitivity)     Status: Abnormal   Collection Time: 10/17/22 11:50 AM  Result Value Ref Range   Troponin I (High Sensitivity) 20 (H) <18 ng/L    Comment: (NOTE) Elevated high sensitivity troponin I (hsTnI) values and significant  changes across serial measurements may suggest ACS but many other  chronic and acute conditions are known to elevate hsTnI results.  Refer to the "Links" section for chest pain algorithms and additional  guidance. Performed at [facility], [address] [place], [place] [address]   Protime-INR     Status: Abnormal   Collection Time: 10/17/22 11:50 AM  Result Value Ref Range   Prothrombin Time 25.6 (H) 11.4 - 15.2 seconds   INR 2.3 (H) 0.8 - 1.2    Comment: (NOTE) INR goal varies based on device and disease states. Performed at [facility], [address] [place], [place] [address]   Type and screen [facility]     Status: None   Collection Time: 10/17/22 11:55 AM  Result Value Ref Range   ABO/RH(D) A NEG    Antibody Screen NEG    Sample Expiration      10/20/2022,2359 Performed at [facility], [address] [place], [place] [address]   I-stat chem 8, ED (not at [facility], [facility] or [facility])     Status: Abnormal   Collection Time: 10/17/22 12:11 PM  Result Value Ref Range   Sodium 139 135 - 145 mmol/L   Potassium 4.1 3.5 - 5.1 mmol/L    Chloride 101 98 - 111 mmol/L   BUN 15 8 - 23 mg/dL   Creatinine, Ser 0.98 0.44 - 1.00 mg/dL   Glucose, Bld 119 (H) 70 - 99 mg/dL    Comment: Glucose reference range applies only to samples taken after fasting for at least 8 hours.   Calcium, Ion 1.19 1.15 - 1.40 mmol/L   TCO2 29 22 - 32 mmol/L   Hemoglobin 12.9 12.0 - 15.0 g/dL   HCT 14.7 82.9 - 56.2 %   *Note: Due to a large number of results and/or encounters for the requested time period, some results have not been displayed. A complete set of results can be found in Results Review.    DG Chest Portable 1 View  Result Date: 10/17/2022 CLINICAL DATA:  Fall with shortness of EXAM: PORTABLE CHEST 1 VIEW COMPARISON:  08/21/2022 FINDINGS: Prior median sternotomy. Heart size upper normal. Chronic hyperinflation and bronchial thickening. Superimposed peripheral septal thickening suspicious for pulmonary edema. No pneumothorax or large pleural effusion. Minimal ill-defined right perihilar opacity. Remote right clavicle fracture post fixation. Remote right rib fractures. IMPRESSION: 1. Chronic hyperinflation and bronchial thickening. 2. Superimposed peripheral septal thickening suspicious for pulmonary edema. 3. Minimal ill-defined right perihilar opacity, favor atelectasis. Electronically Signed   By: Narda Rutherford M.D.   On: 10/17/2022 13:27   DG Pelvis Portable  Result Date: 10/17/2022 CLINICAL DATA:  Fall, pain. EXAM: PORTABLE PELVIS 1-2 VIEWS COMPARISON:  None Available. FINDINGS: Displaced left proximal femur fracture is predominantly intertrochanteric, possibly involving the femoral neck/intertrochanteric junction. The fracture does involve the upper aspect of the greater trochanter. Apex lateral angulation with shortening of the femoral shaft. Femoral head remains seated, no dislocation. There is bilateral hip joint space narrowing. Intact bony pelvis including pubic rami. No pubic symphyseal or sacroiliac diastasis. IMPRESSION: Displaced  and angulated intertrochanteric left femur fracture possibly involving the femoral neck junction. Electronically Signed   By: Narda Rutherford M.D.   On: 10/17/2022 13:25    Review of Systems  HENT:  Negative for ear discharge, ear pain, hearing loss and tinnitus.   Eyes:  Negative for photophobia and pain.  Respiratory:  Negative for cough and shortness of breath.   Cardiovascular:  Negative for chest pain.  Gastrointestinal:  Negative for abdominal pain, nausea and vomiting.  Genitourinary:  Negative for dysuria, flank pain, frequency and urgency.  Musculoskeletal:  Positive for arthralgias (Left hip). Negative for back pain, myalgias and neck pain.  Neurological:  Negative for dizziness and headaches.  Hematological:  Does not bruise/bleed easily.  Psychiatric/Behavioral:  The patient is not nervous/anxious.    Blood pressure (!) 116/94, pulse 98, temperature 98 F (36.7 C), temperature source Oral, resp. rate 18, height 5\' 2"  (1.575 m), weight 70.8 kg, SpO2 92%. Physical Exam Constitutional:  General: She is not in acute distress.    Appearance: She is well-developed. She is not diaphoretic.  HENT:     Head: Normocephalic and atraumatic.  Eyes:     General: No scleral icterus.       Right eye: No discharge.        Left eye: No discharge.     Conjunctiva/sclera: Conjunctivae normal.  Cardiovascular:     Rate and Rhythm: Normal rate and regular rhythm.  Pulmonary:     Effort: Pulmonary effort is normal. No respiratory distress.  Musculoskeletal:     Cervical back: Normal range of motion.     Comments: LLE No traumatic wounds, ecchymosis, or rash  Severe TTP hip  No knee or ankle effusion  Knee stable to varus/ valgus and anterior/posterior stress  Sens DPN, SPN, TN intact  Motor EHL, ext, flex, evers 5/5  DP 1+, PT 1+, No significant edema  Skin:    General: Skin is warm and dry.  Neurological:     Mental Status: She is alert.  Psychiatric:        Mood and Affect:  Mood normal.        Behavior: Behavior normal.     Assessment/Plan: Left hip fx -- Plan tentative IMN tomorrow with Dr. Hulda Humphrey as long as INR comes down. Will give vitamin K. NPO after MN.    Freeman Caldron, PA-C Orthopedic Surgery [phone] 10/17/2022, 1:32 PM

## 2022-10-17 NOTE — Anesthesia Postprocedure Evaluation (Signed)
Anesthesia Post Note  Patient: [name]  Procedure(s) Performed: AN AD HOC NERVE BLOCK     Patient location during evaluation: PACU Anesthesia Type: Regional Level of consciousness: awake and alert Pain management: pain level controlled Vital Signs Assessment: vitals unstable and post-procedure vital signs reviewed and stable Respiratory status: spontaneous breathing, nonlabored ventilation and patient connected to nasal cannula oxygen Cardiovascular status: stable Postop Assessment: no headache Anesthetic complications: no   No notable events documented.  Last Vitals:  Vitals:   10/17/22 1530 10/17/22 1545  BP: 112/83 (!) 123/54  Pulse: (!) 101 (!) 104  Resp: 15 16  Temp:    SpO2: 92% 92%    Last Pain:  Vitals:   10/17/22 1530  TempSrc:   PainSc: 3                  Mariann Barter

## 2022-10-17 NOTE — TOC CAGE-AID Note (Signed)
Transition of Care [facility]) - CAGE-AID Screening   Patient Details  Name: [name] MRN: [id number] Date of Birth: Oct 11, 1950  Hewitt Shorts, RN Trauma Response Nurse Phone Number: [phone] 10/17/2022, 2:04 PM       CAGE-AID Screening:    Have You Ever Felt You Ought to Cut Down on Your Drinking or Drug Use?: No Have People Annoyed You By [employer] Your Drinking Or Drug Use?: No Have You Felt Bad Or Guilty About Your Drinking Or Drug Use?: No Have You Ever Had a Drink or Used Drugs First Thing In The Morning to Steady Your Nerves or to Get Rid of a Hangover?: No CAGE-AID Score: 0  Substance Abuse Education Offered: No (denies drinking alcohol, does smoke 1+ ppd-- requests a patch)

## 2022-10-17 NOTE — ED Notes (Signed)
Pt was given a snack.

## 2022-10-17 NOTE — ED Notes (Signed)
ED TO INPATIENT HANDOFF REPORT  ED Nurse Name and Phone #: Dahlia Client [phone]  S Name/Age/Gender [name] 72 y.o. female Room/Bed: 030C/030C  Code Status   Code Status: Full Code  Home/SNF/Other Home Patient oriented to: self, place, time, and situation Is this baseline? Yes   Triage Complete: Triage complete  Chief Complaint Closed left hip fracture, initial encounter [facility]) [S72.002A]  Triage Note Pt BIB EMS after falling out of chair on left side, having 10/10 left hip pain, rotation and shortening in left leg. On coumadin. EMS gave of Fentanyl. Hx of COPD, not normally on oxygen at home but on 6L up to 92%. Aox4.    Allergies Allergies  Allergen Reactions   Dexilant [Dexlansoprazole] Other (See Comments)    Made acid reflux worse   Lorazepam Other (See Comments)    Patient's sister noted that ativan caused the patient to become extremely confused during hospitalization 09/2010; tolerates Xanax   Oxycontin [Oxycodone] Other (See Comments)    headache   Tramadol Hcl Swelling    Ankle swelling    Level of Care/Admitting Diagnosis ED Disposition     ED Disposition  Admit   Condition  --   Comment  H[facility] [[id number]]  Level of Care: Med-Surg [16]  May admit patient to [facility] or [facility] if equivalent level of care is available:: No  Covid Evaluation: Symptomatic Person Under Investigation (PUI) or recent exposure (last 10 days) *Testing Required*  Diagnosis: Closed left hip fracture, initial encounter [facility]) [[id number]]  Admitting Physician: Dickie La [[id number]]  Attending Physician: Dickie La [[id number]]  Certification:: I certify this patient will need inpatient services for at least 2 midnights  Expected Medical Readiness: 10/20/2022          B Medical/Surgery History Past Medical History:  Diagnosis Date   Abnormality of lung on CXR 02/02/2020   Nonspecific finding on CXR ordered by pulmonologist - c/w  inflammation vs infection, f/u imaging suggested, Dr. Roxy Cedar office has been in communication about recommended next steps.   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   Non-bleeding when visualized on capsule endoscopy 06/30/2015    Arthritis    "lower back; hands" (02/19/2018)   Asthma    Asymptomatic cholelithiasis 09/25/2015   Seen on CT scan 08/2015   Carotid artery stenosis; s/p R endarterectomy    s/p right endarterectomy (06/2010) Carotid US (07/2010):  Left: Moderate-to-severe (60-79%) calcific and non-calcific plaque origin and proximal ICA and ECA    Chronic congestive heart failure with left ventricular diastolic dysfunction (HCC) 10/21/2010   Chronic constipation 02/03/2011   Chronic daily headache 01/16/2014   Chronic iron deficiency anemia    Chronic low back pain 10/06/2012   Chronic venous insufficiency 08/04/2012   Closed fracture of one rib of left side 08/23/2021   ED visit after a fall 08/16/21    COPD (chronic obstructive pulmonary disease) with emphysema (HCC)    PFTs 2018: severe obstructive disease, insignif response to bronchiodilator, mild restriction parenchymal pattern, moderately severe diffusion defect. 2014  FEV1 0.92 (40%), ratio 69, 27% increase in FEV1 with BD, TLC 91%, severe airtrapping, DLCO49% On chronic home O2. Pulmonary rehab referral 05/2012    Dyspnea    Fibromyalgia 08/29/2010   Gastroesophageal reflux disease    History of blood transfusion    "several times"  (02/19/2018)   History of  clear cell renal cell carcinoma (HCC), in remission 07/21/2011   s/p cryoablation of left RCC in 09/2011 by Dr. Fredia Sorrow. Followed by Dr. Retta Diones  [facility]) .     History of hiatal hernia    History of mitral valve replacement with bioprosthetic valve due to mitral stenosis 2012   s/p MVR with a 27-mm pericardial porcine valve  (Medtronic Mosaic valve, serial #[id number] on 09/20/10, Dr. Donata Clay)    History of obstructive sleep apnea, resolved 2013   resolved per sleep study 07/2019; no apnea, but did have desaturation.  CPAP no longer necessary.  Nocturnal polysomnography (06/2009): Moderate sleep apnea/ hypopnea syndrome , AHI 17.8 per hour with nonpositional hypopneas. CPAP titration to 12 CWP, AHI 2.4 per hour. On nocturnal CPAP via a small resMed Quattro full-face mask with heated humidifier.    History of pneumonia    "once"  (02/19/2018)   History of seborrheic keratosis 09/28/2015   History of stroke without residual deficits 01/17/2022   Hyperlipidemia LDL goal < 100 11/20/2005   Internal and external hemorrhoids without complication 08/04/2012   Lesion of left native kidney 06/01/2020   Incidental finding on recent screening chest CT for lung ca ordered by Dr. Maple Hudson pulmonology - he has ordered a f/u renal U/S.    Lichen sclerosus of female genitalia 01/12/2017   Migraine    "none in years" (02/19/2018)   Mild cognitive impairment with memory loss 12/23/2021   Moderately severe major depression (HCC) 11/19/2005   Nocturnal hypoxia per sleep study 07/2019    Osteoporosis    DEXA 2016: T -2.7; DEXA (12/09/2011): L-spine T -3.7, left hip T -1.4 DEXA (12/2004): L-spine T -2.6, left hip -0.1    Paroxysmal atrial fibrillation (HCC) 10/22/2010   s/p Left atrial maze procedure for paroxysmal atrial fibrillation on 09/20/2010 by Dr Donata Clay.  Subsequent splenic infarct, decision was made to re-anticoagulate with coumadin, likely life-long as this is the most likely cause of the splenic infarct.    Personal history of colonic polyps 05/14/2011   Colonoscopy (05/2011): 4 mm adenomatous polyp excised endoscopically Colonoscopy (02/2002): Adenomatous polyp excised endoscopically    Personal history of renal cell carcinoma 09/12/2020   Pneumonia    Pulmonary hypertension due to chronic obstructive pulmonary disease (HCC)  04/25/2016   2014 TEE w PA peak pressure 46 mmHg, s/p MV replacement    Right nephrolithiasis, asymptomatic, incidental finding 09/06/2014   5 mm non-obstructing calculus seen on CT scan 09/05/2014    Right ventricular failure (HCC) 04/25/2016   Severe obesity (BMI 35.0-39.9) with comorbidity (HCC) 10/23/2011   Sleep apnea    Stage 2 chronic kidney disease due to type 2 diabetes mellitus (HCC) 12/16/2018   CKD stage III     Tobacco abuse 07/28/2012   Type 2 diabetes mellitus with diabetic neuropathy [facility])    Past Surgical History:  Procedure Laterality Date   BIOPSY  07/19/2021   Procedure: BIOPSY;  Surgeon: Vida Rigger, MD;  Location: [facility] ENDOSCOPY;  Service: Gastroenterology;;   CARDIAC CATHETERIZATION     CARDIAC VALVE REPLACEMENT  Aug. 2012   "mitral valve"   CAROTID ENDARTERECTOMY Right 07/04/2010   by Dr. Myra Gianotti for asymptomatic right carotid artery stenosis   CATARACT EXTRACTION W/ INTRAOCULAR LENS  IMPLANT, BILATERAL Bilateral    CHEST TUBE INSERTION  09/24/2010   Dr Zenaida Niece Trigt   COLONOSCOPY  05/12/2011   performed by Dr. Bosie Clos. Showing small internal hemorrhoids, single tubular adenoma polyp   COLONOSCOPY WITH PROPOFOL N/A  05/29/2020   Procedure: COLONOSCOPY WITH PROPOFOL;  Surgeon: Vida Rigger, MD;  Location: [facility] ENDOSCOPY;  Service: Endoscopy;  Laterality: N/A;   COLONOSCOPY WITH PROPOFOL N/A 07/19/2021   Procedure: COLONOSCOPY WITH PROPOFOL;  Surgeon: Vida Rigger, MD;  Location: [facility]OS[facility];  Service: Gastroenterology;  Laterality: N/A;   CRYOABLATION Left 09/2011   by Dr. Fredia Sorrow. Followed by Dr. Retta Diones  [facility]) .     DILATION AND CURETTAGE OF UTERUS     ESOPHAGOGASTRODUODENOSCOPY  05/12/2011   performed by Dr. Bosie Clos. Negative for ulcerations, biopsy negative for evidence of celiac sprue   ESOPHAGOGASTRODUODENOSCOPY N/A 06/29/2015   Procedure: ESOPHAGOGASTRODUODENOSCOPY (EGD);  Surgeon: Vida Rigger, MD;  Location: [facility] ENDOSCOPY;  Service: Endoscopy;  Laterality:  N/A;   ESOPHAGOGASTRODUODENOSCOPY N/A 03/29/2016   Procedure: ESOPHAGOGASTRODUODENOSCOPY (EGD);  Surgeon: Vida Rigger, MD;  Location: [name] ENDOSCOPY;  Service: Endoscopy;  Laterality: N/A;   ESOPHAGOGASTRODUODENOSCOPY (EGD) WITH PROPOFOL N/A 05/29/2020   Procedure: ESOPHAGOGASTRODUODENOSCOPY (EGD) WITH PROPOFOL;  Surgeon: Vida Rigger, MD;  Location: [facility];  Service: Endoscopy;  Laterality: N/A;   FRACTURE SURGERY     GIVENS CAPSULE STUDY N/A 06/30/2015   Procedure: GIVENS CAPSULE STUDY;  Surgeon: Vida Rigger, MD;  Location: [facility] ENDOSCOPY;  Service: Endoscopy;  Laterality: N/A;   GIVENS CAPSULE STUDY N/A 06/29/2015   Procedure: GIVENS CAPSULE STUDY;  Surgeon: Vida Rigger, MD;  Location: [facility] ENDOSCOPY;  Service: Endoscopy;  Laterality: N/A;   HEMORRHOID SURGERY  1970s?   "lanced"   HYSTEROSCOPY W/ ENDOMETRIAL ABLATION  06/2001   for persistent post-menopausal bleeding // by S. Kyra Manges, M.D.   IR GENERIC HISTORICAL  08/23/2015   IR RADIOLOGIST EVAL & MGMT 08/23/2015 Irish Lack, MD [facility] INTERV RAD   IR GENERIC HISTORICAL  04/09/2016   IR RADIOLOGIST EVAL & MGMT 04/09/2016 Irish Lack, MD [facility] INTERV RAD   IR RADIOLOGIST EVAL & MGMT  10/07/2016   IR RADIOLOGIST EVAL & MGMT  06/25/2017   IR RADIOLOGIST EVAL & MGMT  07/31/2020   IR RADIOLOGIST EVAL & MGMT  10/02/2020   IR RADIOLOGIST EVAL & MGMT  12/20/2020   IR RADIOLOGIST EVAL & MGMT  12/25/2021   LEFT HEART CATH AND CORONARY ANGIOGRAPHY N/A 04/21/2016   Procedure: Left Heart Cath and Coronary Angiography;  Surgeon: Runell Gess, MD;  Location: [facility] INVASIVE CV LAB;  Service: Cardiovascular;  Laterality: N/A;   LIPOMA EXCISION  08/2005   occipital lipoma 1.5cm - by Dr. Maryagnes Amos   LITHOTRIPSY  ~ 2000   MAZE Left 09/20/10   for paroxysmal atrial fibrillation (Dr. Donata Clay)   MITRAL VALVE REPLACEMENT  09/20/10    with a 27-mm pericardial porcine valve (Medtronic Mosaic valve, serial #[id number]). 09/20/10, Dr Donata Clay   ORIF CLAVICLE  FRACTURE Right 01/2004   by Veverly Fells. Ophelia Charter, M.D for Right clavicle nonunion.; "it's got a pin in it"   POLYPECTOMY  05/29/2020   Procedure: POLYPECTOMY;  Surgeon: Vida Rigger, MD;  Location: [facility] ENDOS[facility];  Service: Endoscopy;;   POLYPECTOMY  07/19/2021   Procedure: POLYPECTOMY;  Surgeon: Vida Rigger, MD;  Location: [facility]OS[facility];  Service: Gastroenterology;;   RADIOLOGY WITH ANESTHESIA Left 09/12/2020   Procedure: RADIOLOGY WITH ANESTHESIA- RENAL CRYOABLATION;  Surgeon: Irish Lack, MD;  Location: [facility] ORS;  Service: Radiology;  Laterality: Left;   REFRACTIVE SURGERY Bilateral    RIGHT HEART CATH N/A 04/23/2016   Procedure: Right Heart Cath;  Surgeon: Laurey Morale, MD;  Location: [facility] INVASIVE CV LAB;  Service: Cardiovascular;  Laterality: N/A;  TONSILLECTOMY     TUBAL LIGATION       A IV Location/Drains/Wounds Patient Lines/Drains/Airways Status     Active Line/Drains/Airways     Name Placement date Placement time Site Days   Peripheral IV 10/17/22 20 G Anterior;Left Forearm 10/17/22  1149  Forearm  less than 1   Peripheral IV 10/17/22 22 G Left;Posterior Hand 10/17/22  --  Hand  less than 1   Wound / Incision (Open or Dehisced) 09/25/22 Irritant Dermatitis (Moisture Associated Skin Damage) Groin Bilateral red 09/25/22  1334  Groin  22   Wound / Incision (Open or Dehisced) 09/25/22 Irritant Dermatitis (Moisture Associated Skin Damage) Abdomen Lower;Medial;Mid red 09/25/22  1334  Abdomen  22            Intake/Output Last 24 hours No intake or output data in the 24 hours ending 10/17/22 1717  Labs/Imaging Results for orders placed or performed during the hospital encounter of 10/17/22 (from the past 48 hour(s))  CBC with Differential     Status: Abnormal   Collection Time: 10/17/22 11:50 AM  Result Value Ref Range   WBC 14.0 (H) 4.0 - 10.5 K/uL   RBC 4.72 3.87 - 5.11 MIL/uL   Hemoglobin 11.4 (L) 12.0 - 15.0 g/dL   HCT 09.8 11.9 - 14.7 %   MCV 81.8 80.0 - 100.0 fL   MCH 24.2  (L) 26.0 - 34.0 pg   MCHC 29.5 (L) 30.0 - 36.0 g/dL   RDW 82.9 (H) 56.2 - 13.0 %   Platelets 241 150 - 400 K/uL   nRBC 0.0 0.0 - 0.2 %   Neutrophils Relative % 88 %   Neutro Abs 12.4 (H) 1.7 - 7.7 K/uL   Lymphocytes Relative 5 %   Lymphs Abs 0.7 0.7 - 4.0 K/uL   Monocytes Relative 6 %   Monocytes Absolute 0.8 0.1 - 1.0 K/uL   Eosinophils Relative 0 %   Eosinophils Absolute 0.0 0.0 - 0.5 K/uL   Basophils Relative 0 %   Basophils Absolute 0.0 0.0 - 0.1 K/uL   Immature Granulocytes 1 %   Abs Immature Granulocytes 0.08 (H) 0.00 - 0.07 K/uL    Comment: Performed at [facility], [address], [place], [place] [address]  Comprehensive metabolic panel     Status: Abnormal   Collection Time: 10/17/22 11:50 AM  Result Value Ref Range   Sodium 138 135 - 145 mmol/L   Potassium 4.1 3.5 - 5.1 mmol/L   Chloride 102 98 - 111 mmol/L   CO2 27 22 - 32 mmol/L   Glucose, Bld 213 (H) 70 - 99 mg/dL    Comment: Glucose reference range applies only to samples taken after fasting for at least 8 hours.   BUN 13 8 - 23 mg/dL   Creatinine, Ser 4.69 0.44 - 1.00 mg/dL   Calcium 8.9 8.9 - 62.9 mg/dL   Total Protein 6.0 (L) 6.5 - 8.1 g/dL   Albumin 3.2 (L) 3.5 - 5.0 g/dL   AST 15 15 - 41 U/L   ALT 14 0 - 44 U/L   Alkaline Phosphatase 62 38 - 126 U/L   Total Bilirubin 0.8 0.3 - 1.2 mg/dL   GFR, Estimated >52 >84 mL/min    Comment: (NOTE) Calculated using the CKD-EPI Creatinine Equation (2021)    Anion gap 9 5 - 15    Comment: Performed at [facility], [address] [place], [place] [address]  Troponin I (High Sensitivity)  Status: Abnormal   Collection Time: 10/17/22 11:50 AM  Result Value Ref Range   Troponin I (High Sensitivity) 20 (H) <18 ng/L    Comment: (NOTE) Elevated high sensitivity troponin I (hsTnI) values and significant  changes across serial measurements may suggest ACS but many other  chronic and acute conditions are known to elevate hsTnI results.  Refer to the  "Links" section for chest pain algorithms and additional  guidance. Performed at [facility], [address], [place], [place] [address]   Protime-INR     Status: Abnormal   Collection Time: 10/17/22 11:50 AM  Result Value Ref Range   Prothrombin Time 25.6 (H) 11.4 - 15.2 seconds   INR 2.3 (H) 0.8 - 1.2    Comment: (NOTE) INR goal varies based on device and disease states. Performed at [facility], [address] [place], [place] [address]   Type and screen [facility]     Status: None   Collection Time: 10/17/22 11:55 AM  Result Value Ref Range   ABO/RH(D) A NEG    Antibody Screen NEG    Sample Expiration      10/20/2022,2359 Performed at [facility], [address], [place], [place] [address]   I-stat chem 8, ED (not at [facility], [facility] or [facility])     Status: Abnormal   Collection Time: 10/17/22 12:11 PM  Result Value Ref Range   Sodium 139 135 - 145 mmol/L   Potassium 4.1 3.5 - 5.1 mmol/L   Chloride 101 98 - 111 mmol/L   BUN 15 8 - 23 mg/dL   Creatinine, Ser 9.14 0.44 - 1.00 mg/dL   Glucose, Bld 782 (H) 70 - 99 mg/dL    Comment: Glucose reference range applies only to samples taken after fasting for at least 8 hours.   Calcium, Ion 1.19 1.15 - 1.40 mmol/L   TCO2 29 22 - 32 mmol/L   Hemoglobin 12.9 12.0 - 15.0 g/dL   HCT 95.6 21.3 - 08.6 %  POC CBG, ED     Status: Abnormal   Collection Time: 10/17/22  1:48 PM  Result Value Ref Range   Glucose-Capillary 154 (H) 70 - 99 mg/dL    Comment: Glucose reference range applies only to samples taken after fasting for at least 8 hours.  Troponin I (High Sensitivity)     Status: Abnormal   Collection Time: 10/17/22  2:59 PM  Result Value Ref Range   Troponin I (High Sensitivity) 19 (H) <18 ng/L    Comment: (NOTE) Elevated high sensitivity troponin I (hsTnI) values and significant  changes across serial measurements may suggest ACS but many other  chronic and acute conditions are known to elevate  hsTnI results.  Refer to the "Links" section for chest pain algorithms and additional  guidance. Performed at [facility], [address], [place], [place] [address]   Brain natriuretic peptide     Status: Abnormal   Collection Time: 10/17/22  2:59 PM  Result Value Ref Range   B Natriuretic Peptide 165.7 (H) 0.0 - 100.0 pg/mL    Comment: Performed at [facility], [address] [place], [place] [address]   *Note: Due to a large number of results and/or encounters for the requested time period, some results have not been displayed. A complete set of results can be found in Results Review.   DG Hip Unilat W or Wo Pelvis 2-3 Views Left  Result Date: 10/17/2022  CLINICAL DATA:  Status post fall EXAM: DG HIP (WITH OR WITHOUT PELVIS) 2-3V LEFT; LEFT FEMUR 2 VIEWS COMPARISON:  None available FINDINGS: Left hip: Acute angulated mildly displaced intratrochanteric fracture of the left proximal femur. Soft tissues are unremarkable. Left femur: No additional fracture or dislocation of the left femur. IMPRESSION: Acute angulated, mildly displaced intertrochanteric fracture of the left proximal femur. Electronically Signed   By: Acquanetta Belling M.D.   On: 10/17/2022 15:34   DG Femur Min 2 Views Left  Result Date: 10/17/2022 CLINICAL DATA:  Status post fall EXAM: DG HIP (WITH OR WITHOUT PELVIS) 2-3V LEFT; LEFT FEMUR 2 VIEWS COMPARISON:  None available FINDINGS: Left hip: Acute angulated mildly displaced intratrochanteric fracture of the left proximal femur. Soft tissues are unremarkable. Left femur: No additional fracture or dislocation of the left femur. IMPRESSION: Acute angulated, mildly displaced intertrochanteric fracture of the left proximal femur. Electronically Signed   By: Acquanetta Belling M.D.   On: 10/17/2022 15:34   CT Cervical Spine Wo Contrast  Result Date: 10/17/2022 CLINICAL DATA:  Neck trauma.  Mechanical fall.  Midline tenderness. EXAM: CT CERVICAL SPINE WITHOUT CONTRAST TECHNIQUE:  Multidetector CT imaging of the cervical spine was performed without intravenous contrast. Multiplanar CT image reconstructions were also generated. RADIATION DOSE REDUCTION: This exam was performed according to the departmental dose-optimization program which includes automated exposure control, adjustment of the mA and/or kV according to patient size and/or use of iterative reconstruction technique. COMPARISON:  CT of cervical spine 05/22/2022 FINDINGS: Alignment: Slight degenerative anterolisthesis at C5-6 is stable. Straightening of the normal cervical lordosis is similar the prior exam. Skull base and vertebrae: The craniocervical junction is normal. Vertebral body heights are normal. No acute fractures are present. Soft tissues and spinal canal: No prevertebral fluid or swelling. No visible canal hematoma. Disc levels:  No significant focal stenosis is present. Upper chest: Patchy airspace opacities are present in both lungs, likely representing edema or atelectasis. IMPRESSION: 1. No acute fracture or traumatic subluxation. 2. Stable degenerative anterolisthesis at C5-6. 3. Patchy airspace opacities in both lungs, likely representing edema or atelectasis. Electronically Signed   By: Marin Roberts M.D.   On: 10/17/2022 14:44   CT ABDOMEN PELVIS W CONTRAST  Result Date: 10/17/2022 CLINICAL DATA:  Fall.  Abdominal pain.  On anticoagulation. EXAM: CT ABDOMEN AND PELVIS WITH CONTRAST TECHNIQUE: Multidetector CT imaging of the abdomen and pelvis was performed using the standard protocol following bolus administration of intravenous contrast. RADIATION DOSE REDUCTION: This exam was performed according to the departmental dose-optimization program which includes automated exposure control, adjustment of the mA and/or kV according to patient size and/or use of iterative reconstruction technique. CONTRAST:  75mL OMNIPAQUE IOHEXOL 350 MG/ML SOLN COMPARISON:  None Available. FINDINGS: Lower chest: Tiny right  pleural effusion. Bibasilar interstitial infiltrates, suspicious for interstitial edema or pneumonitis. Old bilateral rib fracture deformities noted. Hepatobiliary: No hepatic laceration or mass identified. Gallbladder is unremarkable. No evidence of biliary ductal dilatation. Pancreas: No parenchymal laceration, mass, or inflammatory changes identified. Spleen: No evidence of splenic laceration. Adrenal/Urinary Tract: No hemorrhage or parenchymal lacerations identified. Stable old cryoablation defect involving the lower pole of the left kidney. 6 mm right renal calculus again seen. No evidence of suspicious renal masses or hydronephrosis. Stomach/Bowel: Unopacified bowel loops are unremarkable in appearance. No evidence of hemoperitoneum. Vascular/Lymphatic: No evidence of abdominal aortic injury. No pathologically enlarged lymph nodes identified. Reproductive:  No mass or other significant abnormality identified. Other:  None. Musculoskeletal: Comminuted intertrochanteric left  hip fracture is seen. No evidence of dislocation. IMPRESSION: Comminuted intertrochanteric left hip fracture. No evidence of abdominal or pelvic visceral injury. Right nephrolithiasis. No evidence of ureteral calculi or hydronephrosis. Tiny right pleural effusion. Bibasilar interstitial infiltrates, suspicious for interstitial edema or pneumonitis. Electronically Signed   By: Danae Orleans M.D.   On: 10/17/2022 14:44   CT HEAD WO CONTRAST ( )  Result Date: 10/17/2022 CLINICAL DATA:  Mechanical fall. Patient takes blood thinners. Head trauma. EXAM: CT HEAD WITHOUT CONTRAST TECHNIQUE: Contiguous axial images were obtained from the base of the skull through the vertex without intravenous contrast. RADIATION DOSE REDUCTION: This exam was performed according to the departmental dose-optimization program which includes automated exposure control, adjustment of the mA and/or kV according to patient size and/or use of iterative reconstruction  technique. COMPARISON:  CT head without contrast 05/22/2022 FINDINGS: Brain: No acute infarct, hemorrhage, or mass lesion is present. Mild periventricular white matter changes are stable. Deep brain nuclei are within normal limits. The ventricles are of normal size. No significant extraaxial fluid collection is present. The brainstem and cerebellum are within normal limits. Vascular: Atherosclerotic calcifications are again seen at the cavernous internal carotid arteries bilaterally. No hyperdense vessel is present. Skull: Calvarium is intact. No focal lytic or blastic lesions are present. No significant extracranial soft tissue lesion is present. Sinuses/Orbits: A polyp or mucous retention cyst is again noted within the left maxillary sinus. The paranasal sinuses and mastoid air cells are otherwise clear. Bilateral lens replacements are noted. Globes and orbits are otherwise unremarkable. IMPRESSION: 1. No acute intracranial abnormality or significant interval change. 2. Stable mild periventricular white matter disease. This likely reflects the sequela of chronic microvascular ischemia. Electronically Signed   By: Marin Roberts M.D.   On: 10/17/2022 14:37   CT Angio Chest PE W and/or Wo Contrast  Result Date: 10/17/2022 CLINICAL DATA:  Shortness of breath. Hypoxia. Fall. On anticoagulation. EXAM: CT ANGIOGRAPHY CHEST WITH CONTRAST TECHNIQUE: Multidetector CT imaging of the chest was performed using the standard protocol during bolus administration of intravenous contrast. Multiplanar CT image reconstructions and MIPs were obtained to evaluate the vascular anatomy. RADIATION DOSE REDUCTION: This exam was performed according to the departmental dose-optimization program which includes automated exposure control, adjustment of the mA and/or kV according to patient size and/or use of iterative reconstruction technique. CONTRAST:  75mL OMNIPAQUE IOHEXOL 350 MG/ML SOLN COMPARISON:  06/13/2022 FINDINGS:  Cardiovascular: No evidence of thoracic aortic injury. No evidence of thoracic aortic dissection or aneurysm. Satisfactory opacification of the pulmonary arteries noted, and no pulmonary emboli identified. Aortic and coronary atherosclerotic calcification incidentally noted. Prior CABG noted. Mediastinum/Nodes: No masses or pathologically enlarged lymph nodes identified. Lungs/Pleura: No pneumothorax. Moderate centrilobular emphysema again seen. Diffuse interstitial infiltrates are new since previous study, and may be due to interstitial edema or pneumonitis. No evidence of pulmonary consolidation or mass. Tiny right pleural effusion is new, and mild dependent atelectasis is seen in both lower lobes. Upper abdomen: No acute findings. Gallstones are seen, however there is no evidence of cholecystitis or biliary dilatation. Musculoskeletal: No acute fractures identified. 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 Review of the MIP images confirms the above findings. IMPRESSION: No evidence of traumatic injury.  No evidence of pulmonary embolism. New diffuse interstitial infiltrates and tiny right pleural effusion, which may be due to interstitial edema/congestive heart failure or pneumonitis. Cholelithiasis. No radiographic evidence of cholecystitis. Aortic Atherosclerosis (ICD10-I70.0) and Emphysema (ICD10-J43.9). Electronically Signed   By:  Danae Orleans M.D.   On: 10/17/2022 14:30   DG Chest Portable 1 View  Result Date: 10/17/2022 CLINICAL DATA:  Fall with shortness of EXAM: PORTABLE CHEST 1 VIEW COMPARISON:  08/21/2022 FINDINGS: Prior median sternotomy. Heart size upper normal. Chronic hyperinflation and bronchial thickening. Superimposed peripheral septal thickening suspicious for pulmonary edema. No pneumothorax or large pleural effusion. Minimal ill-defined right perihilar opacity. Remote right clavicle fracture post fixation. Remote right rib fractures. IMPRESSION: 1. Chronic  hyperinflation and bronchial thickening. 2. Superimposed peripheral septal thickening suspicious for pulmonary edema. 3. Minimal ill-defined right perihilar opacity, favor atelectasis. Electronically Signed   By: Narda Rutherford M.D.   On: 10/17/2022 13:27   DG Pelvis Portable  Result Date: 10/17/2022 CLINICAL DATA:  Fall, pain. EXAM: PORTABLE PELVIS 1-2 VIEWS COMPARISON:  None Available. FINDINGS: Displaced left proximal femur fracture is predominantly intertrochanteric, possibly involving the femoral neck/intertrochanteric junction. The fracture does involve the upper aspect of the greater trochanter. Apex lateral angulation with shortening of the femoral shaft. Femoral head remains seated, no dislocation. There is bilateral hip joint space narrowing. Intact bony pelvis including pubic rami. No pubic symphyseal or sacroiliac diastasis. IMPRESSION: Displaced and angulated intertrochanteric left femur fracture possibly involving the femoral neck junction. Electronically Signed   By: Narda Rutherford M.D.   On: 10/17/2022 13:25    Pending Labs Unresulted Labs (From admission, onward)    None       Vitals/Pain Today's Vitals   10/17/22 1513 10/17/22 1515 10/17/22 1530 10/17/22 1545  BP: 108/73 125/67 112/83 (!) 123/54  Pulse: (!) 101 (!) 102 (!) 101 (!) 104  Resp: 16 16 15 16   Temp: 98.6 F (37 C)     TempSrc:      SpO2: (!) 87% (!) 88% 92% 92%  Weight:      Height:      PainSc: 5   3      Isolation Precautions No active isolations  Medications Medications  ipratropium-albuterol (DUONEB) 0.5-2.5 (3) MG/3ML nebulizer solution 3 mL (0 mLs Nebulization Hold 10/17/22 1324)  nicotine (NICODERM CQ - dosed in mg/24 hr) patch 7 mg (7 mg Transdermal Patch Applied 10/17/22 1316)  phytonadione (VITAMIN K) 5 mg in dextrose 5 % 50 mL IVPB (5 mg Intravenous New Bag/Given 10/17/22 1649)  fentaNYL (SUBLIMAZE) injection 25 mcg (has no administration in time range)  HYDROmorphone (DILAUDID) injection 0.5  mg (0.5 mg Intravenous Given 10/17/22 1157)  ondansetron (ZOFRAN) injection 4 mg (4 mg Intravenous Given 10/17/22 1156)  methylPREDNISolone sodium succinate (SOLU-MEDROL) 125 mg/2 mL injection 125 mg (125 mg Intravenous Given 10/17/22 1157)  iohexol (OMNIPAQUE) 350 MG/ML injection 75 mL (75 mLs Intravenous Contrast Given 10/17/22 1228)  methocarbamol (ROBAXIN) 500 mg in dextrose 5 % 50 mL IVPB (0 mg Intravenous Stopped 10/17/22 1508)  HYDROmorphone (DILAUDID) injection 1 mg (1 mg Intravenous Given 10/17/22 1343)    Mobility Walks with a cane occassionally     Focused Assessments     R Recommendations: See Admitting Provider Note  Report given to:   Additional Notes:

## 2022-10-17 NOTE — ED Provider Notes (Signed)
[place] [facility] AT [facility] Provider Note   CSN: [id number] Arrival date & time: 10/17/22  1130     History  Chief Complaint  Patient presents with   Durenda Hurt Zeruiah Hamidi is a 72 y.o. female history of pulmonary hyper angina, COPD, chronic back pain, hypertension, hyperlipidemia, A-fib on Coumadin here for evaluation after fall.  Patient states earlier today she slid out of the chair onto her left side.  Started having left hip pain.  Initially was not activated on arrival to emergency department as trauma however on my initial assessment she was activated as a level 2 trauma.  She admits to some mild shortness of breath.  EMS noted her to be hypoxic requiring 6 L supplemental oxygen.  She is only on CPAP at night does not use oxygen during the day unless she naps during the day.  No chest pain, headache, neck pain.  No numbness, weakness.  Think she is on the floor for approximately 1 to 2 hours after the fall.  HPI     Home Medications Prior to Admission medications   Medication Sig Start Date End Date Taking? Authorizing Provider  albuterol (PROAIR HFA) 108 (90 Base) MCG/ACT inhaler Inhale 2 puffs into the lungs every 6 (six) hours as needed for shortness of breath. 02/12/22   Miguel Aschoff, MD  ALPRAZolam Prudy Feeler) 1 MG tablet Take 1/2 - 1 tablet (1/2  - 1 mg total) by mouth at bedtime as needed for anxiety or sleep. May take 1/2 tablet during the day if needed for anxiety 10/09/22   Miguel Aschoff, MD  buPROPion (WELLBUTRIN XL) 150 MG 24 hr tablet Take 1 tablet (150 mg total) by mouth every morning. 12/23/21   Miguel Aschoff, MD  cetirizine (ZYRTEC ALLERGY) 10 MG tablet Take 1 tablet (10 mg total) by mouth daily. 09/19/21 10/12/22  Miguel Aschoff, MD  Continuous Glucose Sensor (FREESTYLE LIBRE 3 SENSOR) MISC Place 1 sensor on the skin every 14 days. Use to check glucose 6 times daily as directed 12/23/21   Miguel Aschoff,  MD  DULoxetine (CYMBALTA) 60 MG capsule Take 1 capsule (60 mg total) by mouth 2 (two) times daily. 03/20/22   Miguel Aschoff, MD  Fluticasone-Umeclidin-Vilant (TRELEGY ELLIPTA) 100-62.5-25 MCG/ACT AEPB Inhale 1 puff into the lungs daily. 03/14/22   Waymon Budge, MD  furosemide (LASIX) 80 MG tablet Take 1 tablet (80 mg total) by mouth every morning. 08/26/22   Miguel Aschoff, MD  gabapentin (NEURONTIN) 600 MG tablet Take 600 mg by mouth at bedtime.    [provider]  HYDROcodone-acetaminophen (NORCO/VICODIN) 5-325 MG tablet Take 1-2 tablets by mouth 3 (three) times daily. Patient taking differently: Take 2 tablets by mouth every 6 (six) hours as needed for moderate pain. 09/09/22   Miguel Aschoff, MD  insulin degludec (TRESIBA FLEXTOUCH) 100 UNIT/ML FlexTouch Pen Inject 44 Units into the skin daily. 06/09/22 11/27/22  Miguel Aschoff, MD  ipratropium (ATROVENT) 0.03 % nasal spray Instill 2 sprays into the nose 3 (three) times daily as directed 06/20/22   Miguel Aschoff, MD  ipratropium-albuterol (DUONEB) 0.5-2.5 (3) MG/3ML SOLN Inhale 3 mLs by nebulization every 6 (six) hours as needed (shortness of breath, wheezing). 04/03/22   Miguel Aschoff, MD  ketoconazole (NIZORAL) 2 % cream Apply 1 application topically at bedtime as needed for irritation. 09/08/22   [name], RPH-CPP  lidocaine-prilocaine (EMLA) cream Apply 1 Application  topically as needed. 09/01/22   McCaughan, Dia D, DPM  linaclotide (LINZESS) 72 MCG capsule Take 1 capsule (72 mcg total) by mouth daily before breakfast. 08/21/22   Miguel Aschoff, MD  metoprolol succinate (TOPROL XL) 25 MG 24 hr tablet Take 1 tablet (25 mg total) by mouth daily. 01/30/22   Adron Bene, MD  ondansetron (ZOFRAN) 4 MG tablet Take 1 tablet (4 mg total) by mouth every 8 (eight) hours as needed for nausea or vomiting. 12/23/21 12/23/22  Miguel Aschoff, MD  OXYGEN Inhale 2 L into the lungs at bedtime.     [provider]  potassium chloride SA (KLOR-CON M) 20 MEQ tablet Take 1 tablet (20 mEq total) by mouth daily. 08/26/22   Miguel Aschoff, MD  rosuvastatin (CRESTOR) 20 MG tablet Take 1 tablet (20 mg total) by mouth at bedtime. 03/14/22 03/09/23  Miguel Aschoff, MD  Semaglutide, 1 MG/DOSE, 4 MG/3ML SOPN Inject 1 mg into the skin once a week. 05/01/22   Miguel Aschoff, MD  warfarin (COUMADIN) 2.5 MG tablet Take 1 tablet by mouth daily unless otherwise instructed. 03/14/22   Miguel Aschoff, MD      Allergies    Dexilant [dexlansoprazole], Lorazepam, Oxycontin [oxycodone], and Tramadol hcl    Review of Systems   Review of Systems  Constitutional: Negative.   HENT: Negative.    Respiratory:  Positive for shortness of breath.   Cardiovascular: Negative.   Gastrointestinal: Negative.   Genitourinary: Negative.   Musculoskeletal:        Left hip pain  Skin: Negative.   Neurological: Negative.   All other systems reviewed and are negative.   Physical Exam Updated Vital Signs BP 125/67 (BP Location: Right Arm)   Pulse (!) 102   Temp 98.6 F (37 C)   Resp 16   Ht 5\' 2"  (1.575 m)   Wt 70.8 kg   SpO2 (!) 88%   BMI 28.53 kg/m  Physical Exam Vitals and nursing note reviewed.  Constitutional:      General: She is not in acute distress.    Appearance: She is well-developed. She is not ill-appearing, toxic-appearing or diaphoretic.  HENT:     Head: Normocephalic and atraumatic.     Nose: Nose normal.     Mouth/Throat:     Mouth: Mucous membranes are moist.     Comments: Blood in mouth, no obvious laceration.  No dentition Eyes:     Pupils: Pupils are equal, round, and reactive to light.  Neck:     Comments: No midline tenderness, full ROM Cardiovascular:     Rate and Rhythm: Normal rate.     Pulses: Normal pulses.          Radial pulses are 2+ on the right side and 2+ on the left side.       Dorsalis pedis pulses are 2+ on the right side and 2+ on the  left side.     Heart sounds: Normal heart sounds.  Pulmonary:     Effort: No respiratory distress.     Breath sounds: Wheezing present.     Comments: Mild expiratory wheeze, speaks without difficulty Abdominal:     General: There is no distension.     Palpations: Abdomen is soft.     Tenderness: There is abdominal tenderness. There is no right CVA tenderness, left CVA tenderness, guarding or rebound.     Comments: Tenderness to left lower abdomen, pelvis and left hip  Musculoskeletal:  General: Normal range of motion.     Cervical back: Normal range of motion.     Comments: Left lower extremity shortening, rotated.  Wiggles toes without difficulty.  Flexes at bilateral knees.  Nontender right lower extremity, bilateral upper extremity  Skin:    General: Skin is warm and dry.     Capillary Refill: Capillary refill takes less than 2 seconds.  Neurological:     General: No focal deficit present.     Mental Status: She is alert and oriented to person, place, and time.     Sensory: No sensory deficit.     Motor: No weakness.  Psychiatric:        Mood and Affect: Mood normal.     ED Results / Procedures / Treatments   Labs (all labs ordered are listed, but only abnormal results are displayed) Labs Reviewed  CBC WITH DIFFERENTIAL/PLATELET - Abnormal; Notable for the following components:      Result Value   WBC 14.0 (*)    Hemoglobin 11.4 (*)    MCH 24.2 (*)    MCHC 29.5 (*)    RDW 23.6 (*)    Neutro Abs 12.4 (*)    Abs Immature Granulocytes 0.08 (*)    All other components within normal limits  COMPREHENSIVE METABOLIC PANEL - Abnormal; Notable for the following components:   Glucose, Bld 213 (*)    Total Protein 6.0 (*)    Albumin 3.2 (*)    All other components within normal limits  PROTIME-INR - Abnormal; Notable for the following components:   Prothrombin Time 25.6 (*)    INR 2.3 (*)    All other components within normal limits  I-STAT CHEM 8, ED - Abnormal;  Notable for the following components:   Glucose, Bld 210 (*)    All other components within normal limits  CBG MONITORING, ED - Abnormal; Notable for the following components:   Glucose-Capillary 154 (*)    All other components within normal limits  TROPONIN I (HIGH SENSITIVITY) - Abnormal; Notable for the following components:   Troponin I (High Sensitivity) 20 (*)    All other components within normal limits  BRAIN NATRIURETIC PEPTIDE  TYPE AND SCREEN  TROPONIN I (HIGH SENSITIVITY)    EKG EKG Interpretation Date/Time:  Friday October 17 2022 11:47:19 EDT Ventricular Rate:  97 PR Interval:  144 QRS Duration:  106 QT Interval:  419 QTC Calculation: 533 R Axis:   141  Text Interpretation: Sinus rhythm Multiple ventricular premature complexes Right axis deviation Probable anteroseptal infarct, old Prolonged QT interval PVCs othewrise no significant change from prior EKG Confirmed by Elayne Snare ([id number]) on 10/17/2022 12:06:51 PM  Radiology CT Cervical Spine Wo Contrast  Result Date: 10/17/2022 CLINICAL DATA:  Neck trauma.  Mechanical fall.  Midline tenderness. EXAM: CT CERVICAL SPINE WITHOUT CONTRAST TECHNIQUE: Multidetector CT imaging of the cervical spine was performed without intravenous contrast. Multiplanar CT image reconstructions were also generated. RADIATION DOSE REDUCTION: This exam was performed according to the departmental dose-optimization program which includes automated exposure control, adjustment of the mA and/or kV according to patient size and/or use of iterative reconstruction technique. COMPARISON:  CT of cervical spine 05/22/2022 FINDINGS: Alignment: Slight degenerative anterolisthesis at C5-6 is stable. Straightening of the normal cervical lordosis is similar the prior exam. Skull base and vertebrae: The craniocervical junction is normal. Vertebral body heights are normal. No acute fractures are present. Soft tissues and spinal canal: No prevertebral fluid or  swelling. No visible canal  hematoma. Disc levels:  No significant focal stenosis is present. Upper chest: Patchy airspace opacities are present in both lungs, likely representing edema or atelectasis. IMPRESSION: 1. No acute fracture or traumatic subluxation. 2. Stable degenerative anterolisthesis at C5-6. 3. Patchy airspace opacities in both lungs, likely representing edema or atelectasis. Electronically Signed   By: Marin Roberts M.D.   On: 10/17/2022 14:44   CT ABDOMEN PELVIS W CONTRAST  Result Date: 10/17/2022 CLINICAL DATA:  Fall.  Abdominal pain.  On anticoagulation. EXAM: CT ABDOMEN AND PELVIS WITH CONTRAST TECHNIQUE: Multidetector CT imaging of the abdomen and pelvis was performed using the standard protocol following bolus administration of intravenous contrast. RADIATION DOSE REDUCTION: This exam was performed according to the departmental dose-optimization program which includes automated exposure control, adjustment of the mA and/or kV according to patient size and/or use of iterative reconstruction technique. CONTRAST:  75mL OMNIPAQUE IOHEXOL 350 MG/ML SOLN COMPARISON:  None Available. FINDINGS: Lower chest: Tiny right pleural effusion. Bibasilar interstitial infiltrates, suspicious for interstitial edema or pneumonitis. Old bilateral rib fracture deformities noted. Hepatobiliary: No hepatic laceration or mass identified. Gallbladder is unremarkable. No evidence of biliary ductal dilatation. Pancreas: No parenchymal laceration, mass, or inflammatory changes identified. Spleen: No evidence of splenic laceration. Adrenal/Urinary Tract: No hemorrhage or parenchymal lacerations identified. Stable old cryoablation defect involving the lower pole of the left kidney. 6 mm right renal calculus again seen. No evidence of suspicious renal masses or hydronephrosis. Stomach/Bowel: Unopacified bowel loops are unremarkable in appearance. No evidence of hemoperitoneum. Vascular/Lymphatic: No evidence of  abdominal aortic injury. No pathologically enlarged lymph nodes identified. Reproductive:  No mass or other significant abnormality identified. Other:  None. Musculoskeletal: Comminuted intertrochanteric left hip fracture is seen. No evidence of dislocation. IMPRESSION: Comminuted intertrochanteric left hip fracture. No evidence of abdominal or pelvic visceral injury. Right nephrolithiasis. No evidence of ureteral calculi or hydronephrosis. Tiny right pleural effusion. Bibasilar interstitial infiltrates, suspicious for interstitial edema or pneumonitis. Electronically Signed   By: Danae Orleans M.D.   On: 10/17/2022 14:44   CT HEAD WO CONTRAST ( )  Result Date: 10/17/2022 CLINICAL DATA:  Mechanical fall. Patient takes blood thinners. Head trauma. EXAM: CT HEAD WITHOUT CONTRAST TECHNIQUE: Contiguous axial images were obtained from the base of the skull through the vertex without intravenous contrast. RADIATION DOSE REDUCTION: This exam was performed according to the departmental dose-optimization program which includes automated exposure control, adjustment of the mA and/or kV according to patient size and/or use of iterative reconstruction technique. COMPARISON:  CT head without contrast 05/22/2022 FINDINGS: Brain: No acute infarct, hemorrhage, or mass lesion is present. Mild periventricular white matter changes are stable. Deep brain nuclei are within normal limits. The ventricles are of normal size. No significant extraaxial fluid collection is present. The brainstem and cerebellum are within normal limits. Vascular: Atherosclerotic calcifications are again seen at the cavernous internal carotid arteries bilaterally. No hyperdense vessel is present. Skull: Calvarium is intact. No focal lytic or blastic lesions are present. No significant extracranial soft tissue lesion is present. Sinuses/Orbits: A polyp or mucous retention cyst is again noted within the left maxillary sinus. The paranasal sinuses and mastoid  air cells are otherwise clear. Bilateral lens replacements are noted. Globes and orbits are otherwise unremarkable. IMPRESSION: 1. No acute intracranial abnormality or significant interval change. 2. Stable mild periventricular white matter disease. This likely reflects the sequela of chronic microvascular ischemia. Electronically Signed   By: Marin Roberts M.D.   On: 10/17/2022 14:37   CT Angio Chest  PE W and/or Wo Contrast  Result Date: 10/17/2022 CLINICAL DATA:  Shortness of breath. Hypoxia. Fall. On anticoagulation. EXAM: CT ANGIOGRAPHY CHEST WITH CONTRAST TECHNIQUE: Multidetector CT imaging of the chest was performed using the standard protocol during bolus administration of intravenous contrast. Multiplanar CT image reconstructions and MIPs were obtained to evaluate the vascular anatomy. RADIATION DOSE REDUCTION: This exam was performed according to the departmental dose-optimization program which includes automated exposure control, adjustment of the mA and/or kV according to patient size and/or use of iterative reconstruction technique. CONTRAST:  75mL OMNIPAQUE IOHEXOL 350 MG/ML SOLN COMPARISON:  06/13/2022 FINDINGS: Cardiovascular: No evidence of thoracic aortic injury. No evidence of thoracic aortic dissection or aneurysm. Satisfactory opacification of the pulmonary arteries noted, and no pulmonary emboli identified. Aortic and coronary atherosclerotic calcification incidentally noted. Prior CABG noted. Mediastinum/Nodes: No masses or pathologically enlarged lymph nodes identified. Lungs/Pleura: No pneumothorax. Moderate centrilobular emphysema again seen. Diffuse interstitial infiltrates are new since previous study, and may be due to interstitial edema or pneumonitis. No evidence of pulmonary consolidation or mass. Tiny right pleural effusion is new, and mild dependent atelectasis is seen in both lower lobes. Upper abdomen: No acute findings. Gallstones are seen, however there is no evidence  of cholecystitis or biliary dilatation. Musculoskeletal: No acute fractures identified. 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 Review of the MIP images confirms the above findings. IMPRESSION: No evidence of traumatic injury.  No evidence of pulmonary embolism. New diffuse interstitial infiltrates and tiny right pleural effusion, which may be due to interstitial edema/congestive heart failure or pneumonitis. Cholelithiasis. No radiographic evidence of cholecystitis. Aortic Atherosclerosis (ICD10-I70.0) and Emphysema (ICD10-J43.9). Electronically Signed   By: Danae Orleans M.D.   On: 10/17/2022 14:30   DG Chest Portable 1 View  Result Date: 10/17/2022 CLINICAL DATA:  Fall with shortness of EXAM: PORTABLE CHEST 1 VIEW COMPARISON:  08/21/2022 FINDINGS: Prior median sternotomy. Heart size upper normal. Chronic hyperinflation and bronchial thickening. Superimposed peripheral septal thickening suspicious for pulmonary edema. No pneumothorax or large pleural effusion. Minimal ill-defined right perihilar opacity. Remote right clavicle fracture post fixation. Remote right rib fractures. IMPRESSION: 1. Chronic hyperinflation and bronchial thickening. 2. Superimposed peripheral septal thickening suspicious for pulmonary edema. 3. Minimal ill-defined right perihilar opacity, favor atelectasis. Electronically Signed   By: Narda Rutherford M.D.   On: 10/17/2022 13:27   DG Pelvis Portable  Result Date: 10/17/2022 CLINICAL DATA:  Fall, pain. EXAM: PORTABLE PELVIS 1-2 VIEWS COMPARISON:  None Available. FINDINGS: Displaced left proximal femur fracture is predominantly intertrochanteric, possibly involving the femoral neck/intertrochanteric junction. The fracture does involve the upper aspect of the greater trochanter. Apex lateral angulation with shortening of the femoral shaft. Femoral head remains seated, no dislocation. There is bilateral hip joint space narrowing. Intact bony pelvis including pubic  rami. No pubic symphyseal or sacroiliac diastasis. IMPRESSION: Displaced and angulated intertrochanteric left femur fracture possibly involving the femoral neck junction. Electronically Signed   By: Narda Rutherford M.D.   On: 10/17/2022 13:25    Procedures .Critical Care  Performed by: Linwood Dibbles, PA-C Authorized by: Linwood Dibbles, PA-C   Critical care provider statement:    Critical care time (minutes):  35   Critical care was necessary to treat or prevent imminent or life-threatening deterioration of the following conditions:  Trauma   Critical care was time spent personally by me on the following activities:  Development of treatment plan with patient or surrogate, discussions with consultants, evaluation of patient's  response to treatment, examination of patient, ordering and review of laboratory studies, ordering and review of radiographic studies, ordering and performing treatments and interventions, pulse oximetry, re-evaluation of patient's condition and review of old charts     Medications Ordered in ED Medications  ipratropium-albuterol (DUONEB) 0.5-2.5 (3) MG/3ML nebulizer solution 3 mL (0 mLs Nebulization Hold 10/17/22 1324)  nicotine (NICODERM CQ - dosed in mg/24 hr) patch 7 mg (7 mg Transdermal Patch Applied 10/17/22 1316)  phytonadione (VITAMIN K) 5 mg in dextrose 5 % 50 mL IVPB (has no administration in time range)  HYDROmorphone (DILAUDID) injection 0.5 mg (0.5 mg Intravenous Given 10/17/22 1157)  ondansetron (ZOFRAN) injection 4 mg (4 mg Intravenous Given 10/17/22 1156)  methylPREDNISolone sodium succinate (SOLU-MEDROL) 125 mg/2 mL injection 125 mg (125 mg Intravenous Given 10/17/22 1157)  iohexol (OMNIPAQUE) 350 MG/ML injection 75 mL (75 mLs Intravenous Contrast Given 10/17/22 1228)  methocarbamol (ROBAXIN) 500 mg in dextrose 5 % 50 mL IVPB (500 mg Intravenous New Bag/Given 10/17/22 1438)  HYDROmorphone (DILAUDID) injection 1 mg (1 mg Intravenous Given 10/17/22 1343)   ED  Course/ Medical Decision Making/ A&P Clinical Course as of 10/17/22 1529  Fri Oct 17, 2022  1150 Instructed to make patient a level 2 given fall on Coumadin. [BH]    Clinical Course User Index [BH] Kinslei Labine A, PA-C   72 year old multiple medical comorbidities here for evaluation mechanical fall earlier today.  Noted to have new hypoxia, has some mild wheeze on exam history of COPD.  She does not think she hit her head however she does have some blood in her mouth.  She has no obvious intraoral lacerations, she denies seizure-like activity.  She has pain to her left hip with shortening or rotation.  She is neurovascularly intact.  Patient made a level 2 trauma given her Coumadin use.  Will plan on labs, imaging, pain management and reassess  Labs and imaging personally viewed and interpreted:  CBC leukocytosis 14.0, hemoglobin 11.4 INR 2.3 Troponin 20 metabolic panel glucose 213 Chest x-ray possible pulmonary edema Pelvic x-ray left displace, angulated intertrochanteric fracture CT head no significant abnormality CT cervical without significant abnormality CTA chest possible pulm edema versus pneumonitis CT abdomen pelvis without acute abnormality EKG PVC, prolonged QT  Patient reassessed.  Requesting additional pain medication.  Stable on 2 L via nasal cannula.  Patient reassessed.  Pain control.  Orthopedics at bedside  Earney Hamburg, PA with orthopedics assessed patient.  Plan for surgical intervention of left intertrochanteric fracture tomorrow.  He is giving patient vitamin K to bring down her INR  Unfortunately delay in CT reads.  I called [facility] radiology, they were not aware that patient was a level 2 trauma.  I made them aware hopefully we can expedite the CT reads.  Consult with IM teaching who will evaluate patient for admission  The patient appears reasonably stabilized for admission considering the current resources, flow, and capabilities available in the  ED at this time, and I doubt any other [facility] requiring further screening and/or treatment in the ED prior to admission.                                  Medical Decision Making Amount and/or Complexity of Data Reviewed Independent Historian: EMS External Data Reviewed: labs, radiology, ECG and notes. Labs: ordered. Decision-making details documented in ED Course. Radiology: ordered and independent interpretation performed. Decision-making details documented in ED  Course. ECG/medicine tests: ordered and independent interpretation performed. Decision-making details documented in ED Course.  Risk OTC drugs. Prescription drug management. Parenteral controlled substances. Drug therapy requiring intensive monitoring for toxicity. Decision regarding hospitalization. Diagnosis or treatment significantly limited by social determinants of health.          Final Clinical Impression(s) / ED Diagnoses Final diagnoses:  Fall, initial encounter  Acute hypoxic respiratory failure (HCC)  Closed displaced intertrochanteric fracture of left femur, initial encounter (HCC)  Chronic anticoagulation  History of atrial fibrillation  Acute pulmonary edema [facility])    Rx / DC Orders ED Discharge Orders     None         Mando Blatz A, PA-C 10/17/22 1529    Rexford Maus, DO 10/17/22 1532

## 2022-10-17 NOTE — ED Notes (Signed)
Pt activated after arrival.

## 2022-10-17 NOTE — Anesthesia Procedure Notes (Addendum)
Anesthesia Regional Block: Fascia iliaca block   Pre-Anesthetic Checklist: , timeout performed,  Correct Patient, Correct Site, Correct Laterality,  Correct Procedure, Correct Position, site marked,  Risks and benefits discussed,  Surgical consent,  Pre-op evaluation,  At surgeon's request and post-op pain management  Laterality: Left  Prep: Maximum Sterile Barrier Precautions used, chloraprep       Needles:  Injection technique: Single-shot  Needle Type: Echogenic Needle      Needle Gauge: 20     Additional Needles:   Procedures:,,,, ultrasound used (permanent image in chart),,    Narrative:  Start time: 10/17/2022 3:30 PM End time: 10/17/2022 3:35 PM Injection made incrementally with aspirations every 5 mL.  Performed by: Personally  Anesthesiologist: Mariann Barter, MD

## 2022-10-18 ENCOUNTER — Inpatient Hospital Stay (HOSPITAL_COMMUNITY): Payer: PPO

## 2022-10-18 DIAGNOSIS — S72002A Fracture of unspecified part of neck of left femur, initial encounter for closed fracture: Secondary | ICD-10-CM

## 2022-10-18 DIAGNOSIS — Z8679 Personal history of other diseases of the circulatory system: Secondary | ICD-10-CM

## 2022-10-18 DIAGNOSIS — J9601 Acute respiratory failure with hypoxia: Secondary | ICD-10-CM

## 2022-10-18 DIAGNOSIS — G4733 Obstructive sleep apnea (adult) (pediatric): Secondary | ICD-10-CM

## 2022-10-18 DIAGNOSIS — Z01818 Encounter for other preprocedural examination: Secondary | ICD-10-CM

## 2022-10-18 DIAGNOSIS — W19XXXA Unspecified fall, initial encounter: Secondary | ICD-10-CM | POA: Diagnosis not present

## 2022-10-18 DIAGNOSIS — I480 Paroxysmal atrial fibrillation: Secondary | ICD-10-CM

## 2022-10-18 DIAGNOSIS — Z953 Presence of xenogenic heart valve: Secondary | ICD-10-CM

## 2022-10-18 DIAGNOSIS — J441 Chronic obstructive pulmonary disease with (acute) exacerbation: Secondary | ICD-10-CM

## 2022-10-18 DIAGNOSIS — F17210 Nicotine dependence, cigarettes, uncomplicated: Secondary | ICD-10-CM

## 2022-10-18 DIAGNOSIS — Z7901 Long term (current) use of anticoagulants: Secondary | ICD-10-CM

## 2022-10-18 DIAGNOSIS — I270 Primary pulmonary hypertension: Secondary | ICD-10-CM

## 2022-10-18 DIAGNOSIS — I272 Pulmonary hypertension, unspecified: Secondary | ICD-10-CM | POA: Diagnosis not present

## 2022-10-18 DIAGNOSIS — I5032 Chronic diastolic (congestive) heart failure: Secondary | ICD-10-CM

## 2022-10-18 DIAGNOSIS — I739 Peripheral vascular disease, unspecified: Secondary | ICD-10-CM

## 2022-10-18 LAB — ECHOCARDIOGRAM COMPLETE
AR max vel: 1.21 cm2
AV Area VTI: 1.39 cm2
AV Area mean vel: 1.25 cm2
AV Mean grad: 9 mmHg
AV Peak grad: 16.6 mmHg
Ao pk vel: 2.04 m/s
Area-P 1/2: 3.17 cm2
Height: 62 in
MV VTI: 1.26 cm2
P 1/2 time: 300 ms
S' Lateral: 3.3 cm
Weight: 2574.97 [oz_av]

## 2022-10-18 LAB — BASIC METABOLIC PANEL
Anion gap: 10 (ref 5–15)
BUN: 18 mg/dL (ref 8–23)
CO2: 27 mmol/L (ref 22–32)
Calcium: 9 mg/dL (ref 8.9–10.3)
Chloride: 99 mmol/L (ref 98–111)
Creatinine, Ser: 1.27 mg/dL — ABNORMAL HIGH (ref 0.44–1.00)
GFR, Estimated: 45 mL/min — ABNORMAL LOW (ref >60)
Glucose, Bld: 310 mg/dL — ABNORMAL HIGH (ref 70–99)
Potassium: 4.5 mmol/L (ref 3.5–5.1)
Sodium: 136 mmol/L (ref 135–145)

## 2022-10-18 LAB — CBC
HCT: 36.2 % (ref 36.0–46.0)
Hemoglobin: 10.3 g/dL — ABNORMAL LOW (ref 12.0–15.0)
MCH: 23.3 pg — ABNORMAL LOW (ref 26.0–34.0)
MCHC: 28.5 g/dL — ABNORMAL LOW (ref 30.0–36.0)
MCV: 81.9 fL (ref 80.0–100.0)
Platelets: 221 10*3/uL (ref 150–400)
RBC: 4.42 MIL/uL (ref 3.87–5.11)
RDW: 22.9 % — ABNORMAL HIGH (ref 11.5–15.5)
WBC: 11.7 10*3/uL — ABNORMAL HIGH (ref 4.0–10.5)
nRBC: 0 % (ref 0.0–0.2)

## 2022-10-18 LAB — VITAMIN D 25 HYDROXY (VIT D DEFICIENCY, FRACTURES): Vit D, 25-Hydroxy: 17.54 ng/mL — ABNORMAL LOW (ref 30–100)

## 2022-10-18 LAB — GLUCOSE, CAPILLARY
Glucose-Capillary: 309 mg/dL — ABNORMAL HIGH (ref 70–99)
Glucose-Capillary: 208 mg/dL — ABNORMAL HIGH (ref 70–99)
Glucose-Capillary: 254 mg/dL — ABNORMAL HIGH (ref 70–99)

## 2022-10-18 LAB — SURGICAL PCR SCREEN
MRSA, PCR: NEGATIVE
Staphylococcus aureus: NEGATIVE

## 2022-10-18 LAB — MAGNESIUM: Magnesium: 2.2 mg/dL (ref 1.7–2.4)

## 2022-10-18 LAB — HEPARIN LEVEL (UNFRACTIONATED): Heparin Unfractionated: 0.18 [IU]/mL — ABNORMAL LOW (ref 0.30–0.70)

## 2022-10-18 MED ORDER — INSULIN GLARGINE-YFGN 100 UNIT/ML ~~LOC~~ SOLN
24.0000 [IU] | Freq: Once | SUBCUTANEOUS | Status: AC
  Administered 2022-10-18: 24 [IU] via SUBCUTANEOUS
  Filled 2022-10-18: qty 0.24

## 2022-10-18 MED ORDER — ONDANSETRON HCL 4 MG/2ML IJ SOLN
INTRAMUSCULAR | Status: AC
  Filled 2022-10-18: qty 2

## 2022-10-18 MED ORDER — LIDOCAINE 2% (20 MG/ML) 5 ML SYRINGE
INTRAMUSCULAR | Status: AC
  Filled 2022-10-18: qty 5

## 2022-10-18 MED ORDER — VITAMIN D (ERGOCALCIFEROL) 1.25 MG (50000 UNIT) PO CAPS
50000.0000 [IU] | ORAL_CAPSULE | ORAL | Status: DC
  Administered 2022-10-18: 50000 [IU] via ORAL
  Filled 2022-10-18: qty 1

## 2022-10-18 MED ORDER — HEPARIN (PORCINE) 25000 UT/250ML-% IV SOLN
1100.0000 [IU]/h | INTRAVENOUS | Status: DC
  Administered 2022-10-18: 950 [IU]/h via INTRAVENOUS
  Filled 2022-10-18: qty 250

## 2022-10-18 MED ORDER — HYDROMORPHONE HCL 1 MG/ML IJ SOLN
1.0000 mg | INTRAMUSCULAR | Status: DC | PRN
  Administered 2022-10-18 – 2022-10-20 (×6): 1 mg via INTRAVENOUS
  Filled 2022-10-18 (×6): qty 1

## 2022-10-18 MED ORDER — OXYCODONE HCL 5 MG PO TABS
5.0000 mg | ORAL_TABLET | ORAL | Status: DC
  Administered 2022-10-18 – 2022-10-21 (×13): 5 mg via ORAL
  Filled 2022-10-18 (×16): qty 1

## 2022-10-18 MED ORDER — ALBUTEROL SULFATE (2.5 MG/3ML) 0.083% IN NEBU
2.5000 mg | INHALATION_SOLUTION | Freq: Two times a day (BID) | RESPIRATORY_TRACT | Status: DC
  Administered 2022-10-18 – 2022-10-22 (×6): 2.5 mg via RESPIRATORY_TRACT
  Filled 2022-10-18 (×8): qty 3

## 2022-10-18 MED ORDER — PROPOFOL 10 MG/ML IV BOLUS
INTRAVENOUS | Status: AC
  Filled 2022-10-18: qty 20

## 2022-10-18 MED ORDER — FENTANYL CITRATE (PF) 250 MCG/5ML IJ SOLN
INTRAMUSCULAR | Status: AC
  Filled 2022-10-18: qty 5

## 2022-10-18 NOTE — Plan of Care (Signed)
Patient ID: [name], female   DOB: Nov 15, 1950, 72 y.o.   MRN: [id number]  Problem: Education: Goal: Knowledge of General Education information will improve Description: Including pain rating scale, medication(s)/side effects and non-pharmacologic comfort measures Outcome: Progressing   Problem: Health Behavior/Discharge Planning: Goal: Ability to manage health-related needs will improve Outcome: Progressing   Problem: Clinical Measurements: Goal: Ability to maintain clinical measurements within normal limits will improve Outcome: Progressing Goal: Will remain free from infection Outcome: Progressing Goal: Diagnostic test results will improve Outcome: Progressing Goal: Respiratory complications will improve Outcome: Progressing Goal: Cardiovascular complication will be avoided Outcome: Progressing   Problem: Activity: Goal: Risk for activity intolerance will decrease Outcome: Progressing   Problem: Nutrition: Goal: Adequate nutrition will be maintained Outcome: Progressing   Problem: Coping: Goal: Level of anxiety will decrease Outcome: Progressing   Problem: Elimination: Goal: Will not experience complications related to bowel motility Outcome: Progressing Goal: Will not experience complications related to urinary retention Outcome: Progressing   Problem: Pain Managment: Goal: General experience of comfort will improve Outcome: Progressing   Problem: Safety: Goal: Ability to remain free from injury will improve Outcome: Progressing   Problem: Skin Integrity: Goal: Risk for impaired skin integrity will decrease Outcome: Progressing   Problem: Education: Goal: Ability to describe self-care measures that may prevent or decrease complications (Diabetes Survival Skills Education) will improve Outcome: Progressing Goal: Individualized Educational Video(s) Outcome: Progressing   Problem: Coping: Goal: Ability to adjust to condition or change in health  will improve Outcome: Progressing   Problem: Fluid Volume: Goal: Ability to maintain a balanced intake and output will improve Outcome: Progressing   Problem: Health Behavior/Discharge Planning: Goal: Ability to identify and utilize available resources and services will improve Outcome: Progressing Goal: Ability to manage health-related needs will improve Outcome: Progressing   Problem: Metabolic: Goal: Ability to maintain appropriate glucose levels will improve Outcome: Progressing   Problem: Nutritional: Goal: Maintenance of adequate nutrition will improve Outcome: Progressing Goal: Progress toward achieving an optimal weight will improve Outcome: Progressing   Problem: Skin Integrity: Goal: Risk for impaired skin integrity will decrease Outcome: Progressing   Problem: Tissue Perfusion: Goal: Adequacy of tissue perfusion will improve Outcome: Progressing    Lidia Collum, RN

## 2022-10-18 NOTE — Progress Notes (Signed)
  Echocardiogram 2D Echocardiogram has been performed.  [name] 10/18/2022, 3:03 PM

## 2022-10-18 NOTE — Consult Note (Addendum)
Cardiology Consultation   Patient ID: Trinice Breon MRN: [id number]; DOB: 04-19-1950  Admit date: 10/17/2022 Date of Consult: 10/18/2022  PCP:  Miguel Aschoff, MD   [facility] Providers Cardiologist:  None  Advanced Heart Failure:  Marca Ancona, MD    Patient Profile:   Seela Schilz is a 72 y.o. female with a hx of COPD, mitral stenosis status post bioprosthetic MV replacement, paroxysmal atrial fibrillation, GI bleed, pulmonary hypertension, RV failure, GI bleed who is being seen 10/18/2022 for the evaluation of preop evaluation/chest pain at the request of Dr. Sol Blazing.  History of Present Illness:   Ms. Thorn is a 72 year old female with past medical history noted above.  She has been followed by Dr. Shirlee Latch as an outpatient.  She was admitted for GI bleeding thought to be secondary to AVMs 03/2016 and her Coumadin was stopped.  1 month later she was admitted for chest pain dyspnea and elevated troponin.  She underwent cardiac catheterization which showed nonobstructive CAD, troponin was felt to be secondary to demand ischemia.  Underwent right heart cath showing elevated right and left heart filling pressures and severe pulmonary hypertension.  VQ scan with no PE and PFT showed severe obstructive lung disease.  Echocardiogram showed severe RV dilation with mildly decreased systolic function.  She was diuresed and sent to SNF for rehab.  Echo 06/2017 with LVEF 60 to 65%, mildly decreased RV systolic function, PASP 49 mmHg, normal bioprosthetic mitral valve.  Underwent PYP scan 05/2019 grade 2, H/CL 1.3 (equivocal). PYP scan repeated 10/21, grade 1 with H/CL 1.04. Not suggestive of TTR amyloidosis.   Echo 01/2020 with LVEF of 60 to 65%, mild LVH, normal RV, PASP 35 mmHg with bioprosthetic mitral valve gradient of 4.  Last seen in office on 08/2021 for follow-up.  She reported she was still smoking at that time.  Had an abnormal right ABI with bilateral leg pain.   Seen by vascular who thought symptoms are most likely neuropathic or joint related rather than due to her PAD.  Her Jardiance was stopped due to severe yeast infection.  Continued on Lasix 80 a.m./40 p.m., 40 mill equivalents of potassium, coumadin and statin.  Presented to the ED on 9/6 after falling asleep at the kitchen table and falling out of her chair onto her left hip.  On EMS arrival she was noted to have rotation and shortening of the left leg.  X-ray showed left hip fracture.  Orthopedic surgery was consulted.  She is planned for IM nail tomorrow pending her INR.  Labs showed sodium 138, potassium 4.1, creatinine 0.98, WBC 14, INR 2.3, high sensitivity troponin 20>> 19, BNP 165.   Cardiology asked to evaluate in regards to preoperative evaluation.   Past Medical History:  Diagnosis Date   Abnormality of lung on CXR 02/02/2020   Nonspecific finding on CXR ordered by pulmonologist - c/w inflammation vs infection, f/u imaging suggested, Dr. Roxy Cedar office has been in communication about recommended next steps.   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   Non-bleeding when visualized on capsule endoscopy 06/30/2015    Arthritis    "lower back; hands" (02/19/2018)   Asthma    Asymptomatic cholelithiasis 09/25/2015   Seen on CT scan 08/2015   Carotid artery stenosis; s/p R endarterectomy    s/p right endarterectomy (06/2010) Carotid US (07/2010):  Left: Moderate-to-severe (60-79%) calcific and  non-calcific plaque origin and proximal ICA and ECA    Chronic congestive heart failure with left ventricular diastolic dysfunction (HCC) 10/21/2010   Chronic constipation 02/03/2011   Chronic daily headache 01/16/2014   Chronic iron deficiency anemia    Chronic low back pain 10/06/2012   Chronic venous insufficiency 08/04/2012   Closed fracture of one rib of left side  08/23/2021   ED visit after a fall 08/16/21    COPD (chronic obstructive pulmonary disease) with emphysema (HCC)    PFTs 2018: severe obstructive disease, insignif response to bronchiodilator, mild restriction parenchymal pattern, moderately severe diffusion defect. 2014  FEV1 0.92 (40%), ratio 69, 27% increase in FEV1 with BD, TLC 91%, severe airtrapping, DLCO49% On chronic home O2. Pulmonary rehab referral 05/2012    Dyspnea    Fibromyalgia 08/29/2010   Gastroesophageal reflux disease    History of blood transfusion    "several times"  (02/19/2018)   History of clear cell renal cell carcinoma (HCC), in remission 07/21/2011   s/p cryoablation of left RCC in 09/2011 by Dr. Fredia Sorrow. Followed by Dr. Retta Diones  [facility]) .     History of hiatal hernia    History of mitral valve replacement with bioprosthetic valve due to mitral stenosis 2012   s/p MVR with a 27-mm pericardial porcine valve (Medtronic Mosaic valve, serial #[id number] on 09/20/10, Dr. Donata Clay)    History of obstructive sleep apnea, resolved 2013   resolved per sleep study 07/2019; no apnea, but did have desaturation.  CPAP no longer necessary.  Nocturnal polysomnography (06/2009): Moderate sleep apnea/ hypopnea syndrome , AHI 17.8 per hour with nonpositional hypopneas. CPAP titration to 12 CWP, AHI 2.4 per hour. On nocturnal CPAP via a small resMed Quattro full-face mask with heated humidifier.    History of pneumonia    "once"  (02/19/2018)   History of seborrheic keratosis 09/28/2015   History of stroke without residual deficits 01/17/2022   Hyperlipidemia LDL goal < 100 11/20/2005   Internal and external hemorrhoids without complication 08/04/2012   Lesion of left native kidney 06/01/2020   Incidental finding on recent screening chest CT for lung ca ordered by Dr. Maple Hudson pulmonology - he has ordered a f/u renal U/S.    Lichen sclerosus of female genitalia 01/12/2017   Migraine    "none in years" (02/19/2018)   Mild  cognitive impairment with memory loss 12/23/2021   Moderately severe major depression (HCC) 11/19/2005   Nocturnal hypoxia per sleep study 07/2019    Osteoporosis    DEXA 2016: T -2.7; DEXA (12/09/2011): L-spine T -3.7, left hip T -1.4 DEXA (12/2004): L-spine T -2.6, left hip -0.1    Paroxysmal atrial fibrillation (HCC) 10/22/2010   s/p Left atrial maze procedure for paroxysmal atrial fibrillation on 09/20/2010 by Dr Donata Clay.  Subsequent splenic infarct, decision was made to re-anticoagulate with coumadin, likely life-long as this is the most likely cause of the splenic infarct.    Personal history of colonic polyps 05/14/2011   Colonoscopy (05/2011): 4 mm adenomatous polyp excised endoscopically Colonoscopy (02/2002): Adenomatous polyp excised endoscopically    Personal history of renal cell carcinoma 09/12/2020   Pneumonia    Pulmonary hypertension due to chronic obstructive pulmonary disease (HCC) 04/25/2016   2014 TEE w PA peak pressure 46 mmHg, s/p MV replacement    Right nephrolithiasis, asymptomatic, incidental finding 09/06/2014   5 mm non-obstructing calculus seen on CT scan 09/05/2014    Right ventricular failure (HCC) 04/25/2016   Severe obesity (BMI 35.0-39.9)  with comorbidity (HCC) 10/23/2011   Sleep apnea    Stage 2 chronic kidney disease due to type 2 diabetes mellitus (HCC) 12/16/2018   CKD stage III     Tobacco abuse 07/28/2012   Type 2 diabetes mellitus with diabetic neuropathy [facility])     Past Surgical History:  Procedure Laterality Date   BIOPSY  07/19/2021   Procedure: BIOPSY;  Surgeon: Vida Rigger, MD;  Location: [facility] ENDOSCOPY;  Service: Gastroenterology;;   CARDIAC CATHETERIZATION     CARDIAC VALVE REPLACEMENT  Aug. 2012   "mitral valve"   CAROTID ENDARTERECTOMY Right 07/04/2010   by Dr. Myra Gianotti for asymptomatic right carotid artery stenosis   CATARACT EXTRACTION W/ INTRAOCULAR LENS  IMPLANT, BILATERAL Bilateral    CHEST TUBE INSERTION  09/24/2010   Dr Zenaida Niece Trigt    COLONOSCOPY  05/12/2011   performed by Dr. Bosie Clos. Showing small internal hemorrhoids, single tubular adenoma polyp   COLONOSCOPY WITH PROPOFOL N/A 05/29/2020   Procedure: COLONOSCOPY WITH PROPOFOL;  Surgeon: Vida Rigger, MD;  Location: [facility] ENDOSCOPY;  Service: Endoscopy;  Laterality: N/A;   COLONOSCOPY WITH PROPOFOL N/A 07/19/2021   Procedure: COLONOSCOPY WITH PROPOFOL;  Surgeon: Vida Rigger, MD;  Location: [facility] ENDOSCOPY;  Service: Gastroenterology;  Laterality: N/A;   CRYOABLATION Left 09/2011   by Dr. Fredia Sorrow. Followed by Dr. Retta Diones  [facility]) .     DILATION AND CURETTAGE OF UTERUS     ESOPHAGOGASTRODUODENOSCOPY  05/12/2011   performed by Dr. Bosie Clos. Negative for ulcerations, biopsy negative for evidence of celiac sprue   ESOPHAGOGASTRODUODENOSCOPY N/A 06/29/2015   Procedure: ESOPHAGOGASTRODUODENOSCOPY (EGD);  Surgeon: Vida Rigger, MD;  Location: [facility] ENDOSCOPY;  Service: Endoscopy;  Laterality: N/A;   ESOPHAGOGASTRODUODENOSCOPY N/A 03/29/2016   Procedure: ESOPHAGOGASTRODUODENOSCOPY (EGD);  Surgeon: Vida Rigger, MD;  Location: [facility] ENDOSCOPY;  Service: Endoscopy;  Laterality: N/A;   ESOPHAGOGASTRODUODENOSCOPY (EGD) WITH PROPOFOL N/A 05/29/2020   Procedure: ESOPHAGOGASTRODUODENOSCOPY (EGD) WITH PROPOFOL;  Surgeon: Vida Rigger, MD;  Location: [facility] ENDOSCOPY;  Service: Endoscopy;  Laterality: N/A;   FRACTURE SURGERY     GIVENS CAPSULE STUDY N/A 06/30/2015   Procedure: GIVENS CAPSULE STUDY;  Surgeon: Vida Rigger, MD;  Location: [facility] ENDOSCOPY;  Service: Endoscopy;  Laterality: N/A;   GIVENS CAPSULE STUDY N/A 06/29/2015   Procedure: GIVENS CAPSULE STUDY;  Surgeon: Vida Rigger, MD;  Location: [facility] ENDOSCOPY;  Service: Endoscopy;  Laterality: N/A;   HEMORRHOID SURGERY  1970s?   "lanced"   HYSTEROSCOPY W/ ENDOMETRIAL ABLATION  06/2001   for persistent post-menopausal bleeding // by S. Kyra Manges, M.D.   IR GENERIC HISTORICAL  08/23/2015   IR RADIOLOGIST EVAL & MGMT 08/23/2015 Irish Lack, MD [facility]  INTERV RAD   IR GENERIC HISTORICAL  04/09/2016   IR RADIOLOGIST EVAL & MGMT 04/09/2016 Irish Lack, MD [facility] INTERV RAD   IR RADIOLOGIST EVAL & MGMT  10/07/2016   IR RADIOLOGIST EVAL & MGMT  06/25/2017   IR RADIOLOGIST EVAL & MGMT  07/31/2020   IR RADIOLOGIST EVAL & MGMT  10/02/2020   IR RADIOLOGIST EVAL & MGMT  12/20/2020   IR RADIOLOGIST EVAL & MGMT  12/25/2021   LEFT HEART CATH AND CORONARY ANGIOGRAPHY N/A 04/21/2016   Procedure: Left Heart Cath and Coronary Angiography;  Surgeon: Runell Gess, MD;  Location: [facility] INVASIVE CV LAB;  Service: Cardiovascular;  Laterality: N/A;   LIPOMA EXCISION  08/2005   occipital lipoma 1.5cm - by Dr. Maryagnes Amos   LITHOTRIPSY  ~ 2000   MAZE Left 09/20/10   for paroxysmal atrial fibrillation (  Dr. Donata Clay)   MITRAL VALVE REPLACEMENT  09/20/10    with a 27-mm pericardial porcine valve (Medtronic Mosaic valve, serial #[id number]). 09/20/10, Dr Donata Clay   ORIF CLAVICLE FRACTURE Right 01/2004   by Veverly Fells. Ophelia Charter, M.D for Right clavicle nonunion.; "it's got a pin in it"   POLYPECTOMY  05/29/2020   Procedure: POLYPECTOMY;  Surgeon: Vida Rigger, MD;  Location: [facility] ENDOSCOPY;  Service: Endoscopy;;   POLYPECTOMY  07/19/2021   Procedure: POLYPECTOMY;  Surgeon: Vida Rigger, MD;  Location: [facility] ENDOSCOPY;  Service: Gastroenterology;;   RADIOLOGY WITH ANESTHESIA Left 09/12/2020   Procedure: RADIOLOGY WITH ANESTHESIA- RENAL CRYOABLATION;  Surgeon: Irish Lack, MD;  Location: [facility] ORS;  Service: Radiology;  Laterality: Left;   REFRACTIVE SURGERY Bilateral    RIGHT HEART CATH N/A 04/23/2016   Procedure: Right Heart Cath;  Surgeon: Laurey Morale, MD;  Location: [facility] INVASIVE CV LAB;  Service: Cardiovascular;  Laterality: N/A;   TONSILLECTOMY     TUBAL LIGATION         Inpatient Medications: Scheduled Meds:  acetaminophen  1,000 mg Oral Q6H   albuterol  2.5 mg Nebulization BID   arformoterol  15 mcg Nebulization BID   budesonide (PULMICORT) nebulizer solution  0.25 mg  Nebulization BID   buPROPion  150 mg Oral BH-q7a   docusate sodium  100 mg Oral Daily   DULoxetine  60 mg Oral Daily   gabapentin  600 mg Oral QHS   insulin aspart  0-15 Units Subcutaneous TID WC   insulin glargine-yfgn  20 Units Subcutaneous Daily   metoprolol succinate  25 mg Oral Daily   nicotine  14 mg Transdermal Q0600   oxyCODONE  5 mg Oral Q4H   predniSONE  40 mg Oral Q breakfast   revefenacin  175 mcg Nebulization Daily   rosuvastatin  20 mg Oral QHS   Vitamin D (Ergocalciferol)  50,000 Units Oral Q7 days   Continuous Infusions:  azithromycin 500 mg (10/17/22 2332)   heparin 950 Units/hr (10/18/22 1139)   PRN Meds: ALPRAZolam, HYDROmorphone (DILAUDID) injection, mouth rinse, polyethylene glycol  Allergies:    Allergies  Allergen Reactions   Dexilant [Dexlansoprazole] Other (See Comments)    Made acid reflux worse   Lorazepam Other (See Comments)    Patient's sister noted that ativan caused the patient to become extremely confused during hospitalization 09/2010; tolerates Xanax   Oxycontin [Oxycodone] Other (See Comments)    headache   Tramadol Hcl Swelling    Ankle swelling    Social History:   Social History   Socioeconomic History   Marital status: Divorced    Spouse name: Not on file   Number of children: Not on file   Years of education: Not on file   Highest education level: Not on file  Occupational History   Not on file  Tobacco Use   Smoking status: Every Day    Current packs/day: 1.00    Average packs/day: 1 pack/day for 60.0 years (60.0 ttl pk-yrs)    Types: Cigarettes    Start date: 11/01/1962   Smokeless tobacco: Never   Tobacco comments:    At least 1 PPD  Vaping Use   Vaping status: Never Used  Substance and Sexual Activity   Alcohol use: Not Currently    Comment: 02/19/2018 "nothing since 1999"   Drug use: Not Currently    Types: Marijuana    Comment: 02/19/2018 "nothing since the 1990s"   Sexual activity: Not Currently    Birth  control/protection: Post-menopausal  Other Topics Concern   Not on file  Social History Narrative   Lives alone in [place] ([address])   Worked at [employer] for 18 years   No car   Social Determinants of Corporate investment banker Strain: Low Risk  (05/01/2022)   Overall Financial Resource Strain (CARDIA)    Difficulty of Paying Living Expenses: Not hard at all  Food Insecurity: Food Insecurity Present (10/17/2022)   Hunger Vital Sign    Worried About Programme researcher, broadcasting/film/video in the Last Year: Never true    Ran Out of Food in the Last Year: Sometimes true  Transportation Needs: Unmet Transportation Needs (10/17/2022)   PRAPARE - Administrator, Civil Service (Medical): Yes    Lack of Transportation (Non-Medical): Yes  Physical Activity: Inactive (05/01/2022)   Exercise Vital Sign    Days of Exercise per Week: 0 days    Minutes of Exercise per Session: 0 min  Stress: Stress Concern Present (05/01/2022)   [employer] of Occupational Health - Occupational Stress Questionnaire    Feeling of Stress : Rather much  Social Connections: Socially Isolated (05/01/2022)   Social Connection and Isolation Panel [NHANES]    Frequency of Communication with Friends and Family: More than three times a week    Frequency of Social Gatherings with Friends and Family: More than three times a week    Attends Religious Services: Never    Database administrator or Organizations: No    Attends Banker Meetings: Never    Marital Status: Divorced  Catering manager Violence: Not At Risk (10/17/2022)   Humiliation, Afraid, Rape, and Kick questionnaire    Fear of Current or Ex-Partner: No    Emotionally Abused: No    Physically Abused: No    Sexually Abused: No    Family History:    Family History  Problem Relation Age of Onset   Peptic Ulcer Disease Father    Heart attack Father 47       Died of MI at age 63   Heart attack Brother 18       Died of MI at age 53    Obesity Brother    Pneumonia Mother    Healthy Sister    Lupus Daughter    Obsessive Compulsive Disorder Daughter      ROS:  Please see the history of present illness.   All other ROS reviewed and negative.     Physical Exam/Data:   Vitals:   10/17/22 2020 10/17/22 2110 10/18/22 0741 10/18/22 0757  BP:  (!) 135/48  (!) 138/112  Pulse:  98  100  Resp:      Temp:  98.6 F (37 C)  98 F (36.7 C)  TempSrc:    Oral  SpO2: 95% 96% 98% 97%  Weight:      Height:       No intake or output data in the 24 hours ending 10/18/22 1352    10/17/2022    6:00 PM 10/17/2022   11:41 AM 10/09/2022    9:37 AM  Last 3 Weights  Weight (lbs) 160 lb 15 oz 156 lb 159 lb 8 oz  Weight (kg) 73 kg 70.761 kg 72.349 kg     Body mass index is 29.44 kg/m.  General:  Well nourished, well developed, in no acute distress, wearing [place] @2 .5L HEENT: normal Neck: no JVD Vascular: No carotid bruits; Distal pulses 2+ bilaterally Cardiac:  normal  S1, S2; RRR; + systolic murmur  Lungs:  Diminished in bases Abd: soft, nontender, no hepatomegaly  Ext: no edema Musculoskeletal:  Left leg shortened, rotated Skin: warm and dry  Neuro:  CNs 2-12 intact, no focal abnormalities noted Psych:  Normal affect   EKG:  The EKG was personally reviewed and demonstrates:  Sinus Rhythm, 97 bpm, PVCs, 1st degree AVB Telemetry:  Telemetry was personally reviewed and demonstrates:  Sinus Rhythm, PVCs  Relevant CV Studies:  Echo: 01/2020  IMPRESSIONS     1. Left ventricular ejection fraction, by estimation, is 65 to 70%. The  left ventricle has normal function. The left ventricle has no regional  wall motion abnormalities. There is mild concentric left ventricular  hypertrophy. Left ventricular diastolic  function could not be evaluated.   2. Right ventricular systolic function is normal. The right ventricular  size is normal. There is mildly elevated pulmonary artery systolic  pressure. The estimated right ventricular  systolic pressure is 35.3 mmHg.   3. Left atrial size was moderately dilated.   4. Right atrial size was mildly dilated.   5. The mitral valve has been repaired/replaced. Mild mitral valve  regurgitation. No evidence of mitral stenosis. The mean mitral valve  gradient is 4.0 mmHg with average heart rate of 62 bpm. There is a 27 mm  porcine present in the mitral position.   6. Tricuspid valve regurgitation is mild to moderate.   7. The aortic valve is normal in structure. Aortic valve regurgitation is  not visualized. No aortic stenosis is present.   8. The inferior vena cava is normal in size with greater than 50%  respiratory variability, suggesting right atrial pressure of 3 mmHg.   FINDINGS   Left Ventricle: Left ventricular ejection fraction, by estimation, is 65  to 70%. The left ventricle has normal function. The left ventricle has no  regional wall motion abnormalities. The left ventricular internal cavity  size was normal in size. There is   mild concentric left ventricular hypertrophy. Left ventricular diastolic  function could not be evaluated due to mitral valve replacement. Left  ventricular diastolic function could not be evaluated.   Right Ventricle: The right ventricular size is normal. No increase in  right ventricular wall thickness. Right ventricular systolic function is  normal. There is mildly elevated pulmonary artery systolic pressure. The  tricuspid regurgitant velocity is 2.84   m/s, and with an assumed right atrial pressure of 3 mmHg, the estimated  right ventricular systolic pressure is 35.3 mmHg.   Left Atrium: Left atrial size was moderately dilated.   Right Atrium: Right atrial size was mildly dilated.   Pericardium: There is no evidence of pericardial effusion.   Mitral Valve: The mitral valve has been repaired/replaced. Mild mitral  valve regurgitation. There is a 27 mm porcine present in the mitral  position. No evidence of mitral valve stenosis.  MV peak gradient, 8.8  mmHg. The mean mitral valve gradient is 4.0  mmHg with average heart rate of 62 bpm.   Tricuspid Valve: The tricuspid valve is normal in structure. Tricuspid  valve regurgitation is mild to moderate. No evidence of tricuspid  stenosis.   Aortic Valve: The aortic valve is normal in structure. Aortic valve  regurgitation is not visualized. No aortic stenosis is present.   Pulmonic Valve: The pulmonic valve was normal in structure. Pulmonic valve  regurgitation is not visualized. No evidence of pulmonic stenosis.   Aorta: The aortic root is normal in size and  structure.   Venous: The inferior vena cava is normal in size with greater than 50%  respiratory variability, suggesting right atrial pressure of 3 mmHg.   IAS/Shunts: No atrial level shunt detected by color flow Doppler.    Laboratory Data:  High Sensitivity Troponin:   Recent Labs  Lab 10/17/22 1150 10/17/22 1459  TROPONINIHS 20* 19*     Chemistry Recent Labs  Lab 10/17/22 1150 10/17/22 1211 10/18/22 0811  NA 138 139 136  K 4.1 4.1 4.5  CL 102 101 99  CO2 27  --  27  GLUCOSE 213* 210* 310*  BUN 13 15 18   CREATININE 0.98 1.00 1.27*  CALCIUM 8.9  --  9.0  GFRNONAA >60  --  45*  ANIONGAP 9  --  10    Recent Labs  Lab 10/17/22 1150  PROT 6.0*  ALBUMIN 3.2*  AST 15  ALT 14  ALKPHOS 62  BILITOT 0.8   Lipids No results for input(s): "CHOL", "TRIG", "HDL", "LABVLDL", "LDLCALC", "CHOLHDL" in the last 168 hours.  Hematology Recent Labs  Lab 10/17/22 1150 10/17/22 1211 10/18/22 0811  WBC 14.0*  --  11.7*  RBC 4.72  --  4.42  HGB 11.4* 12.9 10.3*  HCT 38.6 38.0 36.2  MCV 81.8  --  81.9  MCH 24.2*  --  23.3*  MCHC 29.5*  --  28.5*  RDW 23.6*  --  22.9*  PLT 241  --  221   Thyroid No results for input(s): "TSH", "FREET4" in the last 168 hours.  BNP Recent Labs  Lab 10/17/22 1459  BNP 165.7*    DDimer No results for input(s): "DDIMER" in the last 168  hours.   Radiology/Studies:  DG Hip Unilat W or Wo Pelvis 2-3 Views Left  Result Date: 10/17/2022 CLINICAL DATA:  Status post fall EXAM: DG HIP (WITH OR WITHOUT PELVIS) 2-3V LEFT; LEFT FEMUR 2 VIEWS COMPARISON:  None available FINDINGS: Left hip: Acute angulated mildly displaced intratrochanteric fracture of the left proximal femur. Soft tissues are unremarkable. Left femur: No additional fracture or dislocation of the left femur. IMPRESSION: Acute angulated, mildly displaced intertrochanteric fracture of the left proximal femur. Electronically Signed   By: Acquanetta Belling M.D.   On: 10/17/2022 15:34   DG Femur Min 2 Views Left  Result Date: 10/17/2022 CLINICAL DATA:  Status post fall EXAM: DG HIP (WITH OR WITHOUT PELVIS) 2-3V LEFT; LEFT FEMUR 2 VIEWS COMPARISON:  None available FINDINGS: Left hip: Acute angulated mildly displaced intratrochanteric fracture of the left proximal femur. Soft tissues are unremarkable. Left femur: No additional fracture or dislocation of the left femur. IMPRESSION: Acute angulated, mildly displaced intertrochanteric fracture of the left proximal femur. Electronically Signed   By: Acquanetta Belling M.D.   On: 10/17/2022 15:34   CT Cervical Spine Wo Contrast  Result Date: 10/17/2022 CLINICAL DATA:  Neck trauma.  Mechanical fall.  Midline tenderness. EXAM: CT CERVICAL SPINE WITHOUT CONTRAST TECHNIQUE: Multidetector CT imaging of the cervical spine was performed without intravenous contrast. Multiplanar CT image reconstructions were also generated. RADIATION DOSE REDUCTION: This exam was performed according to the departmental dose-optimization program which includes automated exposure control, adjustment of the mA and/or kV according to patient size and/or use of iterative reconstruction technique. COMPARISON:  CT of cervical spine 05/22/2022 FINDINGS: Alignment: Slight degenerative anterolisthesis at C5-6 is stable. Straightening of the normal cervical lordosis is similar the prior  exam. Skull base and vertebrae: The craniocervical junction is normal. Vertebral body heights are normal.  No acute fractures are present. Soft tissues and spinal canal: No prevertebral fluid or swelling. No visible canal hematoma. Disc levels:  No significant focal stenosis is present. Upper chest: Patchy airspace opacities are present in both lungs, likely representing edema or atelectasis. IMPRESSION: 1. No acute fracture or traumatic subluxation. 2. Stable degenerative anterolisthesis at C5-6. 3. Patchy airspace opacities in both lungs, likely representing edema or atelectasis. Electronically Signed   By: Marin Roberts M.D.   On: 10/17/2022 14:44   CT ABDOMEN PELVIS W CONTRAST  Result Date: 10/17/2022 CLINICAL DATA:  Fall.  Abdominal pain.  On anticoagulation. EXAM: CT ABDOMEN AND PELVIS WITH CONTRAST TECHNIQUE: Multidetector CT imaging of the abdomen and pelvis was performed using the standard protocol following bolus administration of intravenous contrast. RADIATION DOSE REDUCTION: This exam was performed according to the departmental dose-optimization program which includes automated exposure control, adjustment of the mA and/or kV according to patient size and/or use of iterative reconstruction technique. CONTRAST:  75mL OMNIPAQUE IOHEXOL 350 MG/ML SOLN COMPARISON:  None Available. FINDINGS: Lower chest: Tiny right pleural effusion. Bibasilar interstitial infiltrates, suspicious for interstitial edema or pneumonitis. Old bilateral rib fracture deformities noted. Hepatobiliary: No hepatic laceration or mass identified. Gallbladder is unremarkable. No evidence of biliary ductal dilatation. Pancreas: No parenchymal laceration, mass, or inflammatory changes identified. Spleen: No evidence of splenic laceration. Adrenal/Urinary Tract: No hemorrhage or parenchymal lacerations identified. Stable old cryoablation defect involving the lower pole of the left kidney. 6 mm right renal calculus again seen. No  evidence of suspicious renal masses or hydronephrosis. Stomach/Bowel: Unopacified bowel loops are unremarkable in appearance. No evidence of hemoperitoneum. Vascular/Lymphatic: No evidence of abdominal aortic injury. No pathologically enlarged lymph nodes identified. Reproductive:  No mass or other significant abnormality identified. Other:  None. Musculoskeletal: Comminuted intertrochanteric left hip fracture is seen. No evidence of dislocation. IMPRESSION: Comminuted intertrochanteric left hip fracture. No evidence of abdominal or pelvic visceral injury. Right nephrolithiasis. No evidence of ureteral calculi or hydronephrosis. Tiny right pleural effusion. Bibasilar interstitial infiltrates, suspicious for interstitial edema or pneumonitis. Electronically Signed   By: Danae Orleans M.D.   On: 10/17/2022 14:44   CT HEAD WO CONTRAST ( )  Result Date: 10/17/2022 CLINICAL DATA:  Mechanical fall. Patient takes blood thinners. Head trauma. EXAM: CT HEAD WITHOUT CONTRAST TECHNIQUE: Contiguous axial images were obtained from the base of the skull through the vertex without intravenous contrast. RADIATION DOSE REDUCTION: This exam was performed according to the departmental dose-optimization program which includes automated exposure control, adjustment of the mA and/or kV according to patient size and/or use of iterative reconstruction technique. COMPARISON:  CT head without contrast 05/22/2022 FINDINGS: Brain: No acute infarct, hemorrhage, or mass lesion is present. Mild periventricular white matter changes are stable. Deep brain nuclei are within normal limits. The ventricles are of normal size. No significant extraaxial fluid collection is present. The brainstem and cerebellum are within normal limits. Vascular: Atherosclerotic calcifications are again seen at the cavernous internal carotid arteries bilaterally. No hyperdense vessel is present. Skull: Calvarium is intact. No focal lytic or blastic lesions are  present. No significant extracranial soft tissue lesion is present. Sinuses/Orbits: A polyp or mucous retention cyst is again noted within the left maxillary sinus. The paranasal sinuses and mastoid air cells are otherwise clear. Bilateral lens replacements are noted. Globes and orbits are otherwise unremarkable. IMPRESSION: 1. No acute intracranial abnormality or significant interval change. 2. Stable mild periventricular white matter disease. This likely reflects the sequela of chronic microvascular ischemia. Electronically  Signed   By: Marin Roberts M.D.   On: 10/17/2022 14:37   CT Angio Chest PE W and/or Wo Contrast  Result Date: 10/17/2022 CLINICAL DATA:  Shortness of breath. Hypoxia. Fall. On anticoagulation. EXAM: CT ANGIOGRAPHY CHEST WITH CONTRAST TECHNIQUE: Multidetector CT imaging of the chest was performed using the standard protocol during bolus administration of intravenous contrast. Multiplanar CT image reconstructions and MIPs were obtained to evaluate the vascular anatomy. RADIATION DOSE REDUCTION: This exam was performed according to the departmental dose-optimization program which includes automated exposure control, adjustment of the mA and/or kV according to patient size and/or use of iterative reconstruction technique. CONTRAST:  75mL OMNIPAQUE IOHEXOL 350 MG/ML SOLN COMPARISON:  06/13/2022 FINDINGS: Cardiovascular: No evidence of thoracic aortic injury. No evidence of thoracic aortic dissection or aneurysm. Satisfactory opacification of the pulmonary arteries noted, and no pulmonary emboli identified. Aortic and coronary atherosclerotic calcification incidentally noted. Prior CABG noted. Mediastinum/Nodes: No masses or pathologically enlarged lymph nodes identified. Lungs/Pleura: No pneumothorax. Moderate centrilobular emphysema again seen. Diffuse interstitial infiltrates are new since previous study, and may be due to interstitial edema or pneumonitis. No evidence of pulmonary  consolidation or mass. Tiny right pleural effusion is new, and mild dependent atelectasis is seen in both lower lobes. Upper abdomen: No acute findings. Gallstones are seen, however there is no evidence of cholecystitis or biliary dilatation. Musculoskeletal: No acute fractures identified. 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 Review of the MIP images confirms the above findings. IMPRESSION: No evidence of traumatic injury.  No evidence of pulmonary embolism. New diffuse interstitial infiltrates and tiny right pleural effusion, which may be due to interstitial edema/congestive heart failure or pneumonitis. Cholelithiasis. No radiographic evidence of cholecystitis. Aortic Atherosclerosis (ICD10-I70.0) and Emphysema (ICD10-J43.9). Electronically Signed   By: Danae Orleans M.D.   On: 10/17/2022 14:30   DG Chest Portable 1 View  Result Date: 10/17/2022 CLINICAL DATA:  Fall with shortness of EXAM: PORTABLE CHEST 1 VIEW COMPARISON:  08/21/2022 FINDINGS: Prior median sternotomy. Heart size upper normal. Chronic hyperinflation and bronchial thickening. Superimposed peripheral septal thickening suspicious for pulmonary edema. No pneumothorax or large pleural effusion. Minimal ill-defined right perihilar opacity. Remote right clavicle fracture post fixation. Remote right rib fractures. IMPRESSION: 1. Chronic hyperinflation and bronchial thickening. 2. Superimposed peripheral septal thickening suspicious for pulmonary edema. 3. Minimal ill-defined right perihilar opacity, favor atelectasis. Electronically Signed   By: Narda Rutherford M.D.   On: 10/17/2022 13:27   DG Pelvis Portable  Result Date: 10/17/2022 CLINICAL DATA:  Fall, pain. EXAM: PORTABLE PELVIS 1-2 VIEWS COMPARISON:  None Available. FINDINGS: Displaced left proximal femur fracture is predominantly intertrochanteric, possibly involving the femoral neck/intertrochanteric junction. The fracture does involve the upper aspect of the greater  trochanter. Apex lateral angulation with shortening of the femoral shaft. Femoral head remains seated, no dislocation. There is bilateral hip joint space narrowing. Intact bony pelvis including pubic rami. No pubic symphyseal or sacroiliac diastasis. IMPRESSION: Displaced and angulated intertrochanteric left femur fracture possibly involving the femoral neck junction. Electronically Signed   By: Narda Rutherford M.D.   On: 10/17/2022 13:25     Assessment and Plan:   Annaleece Gatzke is a 72 y.o. female with a hx of COPD, mitral stenosis status post bioprosthetic MV replacement, paroxysmal atrial fibrillation, GI bleed, pulmonary hypertension, RV failure, GI bleed who is being seen 10/18/2022 for the evaluation of preop evaluation/chest pain at the request of Dr. Sol Blazing.  Preoperative evaluation Left Hip fx Pulmonary  hypertension HFpEF w/ RV failure --Followed by Dr. Shirlee Latch, per previous notes it is not felt that she will benefit from selective pulmonary vasodilators.  Plans were to maintain stable volume status with continued use of home oxygen and CPAP -- Last echocardiogram 01/2020 LVEF of 60 to 65%, mild LVH, normal RV, PASP 35 mmHg -- Now presents with left hip fracture with plans for IM nail per Ortho -- BNP 165, CXR with pulmonary edema  -- does not appear significantly volume overloaded on exam, s/p IV lasix last evening. No I&Os noted -- given her pulmonary hypertension she is at elevated risk for surgery with anesthesia, though echo this admission shows normal RV function, unable to evaluate pulmonary pressures -- Cr up 1.0>>1.27 today, needs close monitoring of volume status -- of note, jardiance stopped previously in the setting of yeast infection -- She is at elevated surgical risk given her history but appears compensated.  Echo shows normal biventricular function, stable gradients across bioprosthetic mitral valve.  No further cardiac work-up recommended prior to  surgery  Paroxysmal atrial fibrillation -- Currently in sinus rhythm -- Coumadin on hold, INR 1.5 today status post vitamin K  PVCs -- K+ 4.5, check mag   Mitral stenosis s/p MV replacement -- stable gradient on echo 01/2020 -- as above, repeat echo  COPD Tobacco use Chronic hypoxic respiratory failure -- continues to smoke -- uses home O2 @2 .5L and Cpap when sleeping  PAD -- Follows with [facility], Dr. Myra Gianotti.  Has tried Pletal in the past but did not help.  With CHF this should be avoided -- On statin  Risk Assessment/Risk Scores:   CHA2DS2-VASc Score = 4  This indicates a 4.8% annual risk of stroke. The patient's score is based upon: CHF History: 1 HTN History: 0 Diabetes History: 0 Stroke History: 0 Vascular Disease History: 1 Age Score: 1 Gender Score: 1    For questions or updates, please contact [facility] Please consult [email or web address] for contact info under    Signed, Laverda Page, NP  10/18/2022 1:52 PM  Patient seen and examined.  Agree with above documentation.  Ms. Winegarner is a 72 year old female with history of mitral stenosis status post bioprosthetic mitral valve replacement, COPD, paroxysmal atrial fibrillation, pulmonary hypertension, GI bleed we are consulted for preop evaluation at the request of Dr. Sol Blazing.  She was last seen in cardiology clinic 08/2021.  She presented this admission on 9/6 after falling asleep at the kitchen table, and falling out of her chair and onto her left side.  X-ray showed left hip fracture on admission.  She is planned for intramedullary nail tomorrow.  Labs on admission notable for creatinine 0.98, potassium 4.1, WBC 14, INR 2.3, troponin 20 > 19, BNP 165.   On exam, patient is alert and oriented, regular rate and rhythm, no murmurs, lungs CTAB, no LE edema.   For her preop evaluation, functional capacity is limited, less than 4 METS.  She does report occasional chest pain, but sounds very atypical, states does not  occur with exertion, happens at rest and describes far left-sided pain.  Her echo today shows normal biventricular function, stable gradients across mitral valve.  She is at elevated risk for surgery given her history but currently appears compensated.  Discussed this with patient and she recognizes risks but would like to proceed.  No further cardiac work-up recommended prior to surgery.  Little Ishikawa, MD

## 2022-10-18 NOTE — Progress Notes (Signed)
Subjective: Pending left hip IMN, pending medical optimization  Activity level:  NWB LLE Diet tolerance:  as tolerated, NPO at midnight Voiding:  ok Patient reports pain as moderate.    Objective: Vital signs in last 24 hours: Temp:  [98 F (36.7 C)-98.6 F (37 C)] 98 F (36.7 C) (09/07 0757) Pulse Rate:  [94-104] 100 (09/07 0757) Resp:  [14-26] 19 (09/06 1800) BP: (98-146)/(43-118) 138/112 (09/07 0757) SpO2:  [86 %-98 %] 97 % (09/07 0757) Weight:  [70.8 kg-73 kg] 73 kg (09/06 1800)  Labs: Recent Labs    10/17/22 1150 10/17/22 1211 10/18/22 0811  HGB 11.4* 12.9 10.3*   Recent Labs    10/17/22 1150 10/17/22 1211 10/18/22 0811  WBC 14.0*  --  11.7*  RBC 4.72  --  4.42  HCT 38.6 38.0 36.2  PLT 241  --  221   Recent Labs    10/17/22 1150 10/17/22 1211 10/18/22 0811  NA 138 139 136  K 4.1 4.1 4.5  CL 102 101 99  CO2 27  --  27  BUN 13 15 18   CREATININE 0.98 1.00 1.27*  GLUCOSE 213* 210* 310*  CALCIUM 8.9  --  9.0   Recent Labs    10/17/22 1150 10/17/22 2125  INR 2.3* 1.5*    Physical Exam:  Neurologically intact ABD soft Neurovascular intact Sensation intact distally Intact pulses distally Dorsiflexion/Plantar flexion intact  Assessment/Plan:  Day of Surgery Procedure(s) (LRB): LEFT INTRAMEDULLARY (IM) NAIL INTERTROCHANTERIC (Left) Plan for IMN left hip once medically optimized Pain control Hold DVT ppx, patient on coumadin Greatly appreciate medical management    Luci Bank 10/18/2022, 9:58 AM

## 2022-10-18 NOTE — Progress Notes (Signed)
ANTICOAGULATION CONSULT NOTE - Follow Up Consult  Pharmacy Consult for heparin (warfarin PTA > reversed) Indication: atrial fibrillation and bioprosthetic MV replacement  Allergies  Allergen Reactions   Dexilant [Dexlansoprazole] Other (See Comments)    Made acid reflux worse   Lorazepam Other (See Comments)    Patient's sister noted that ativan caused the patient to become extremely confused during hospitalization 09/2010; tolerates Xanax   Oxycontin [Oxycodone] Other (See Comments)    headache   Tramadol Hcl Swelling    Ankle swelling    Patient Measurements: Height: 5\' 2"  (157.5 cm) Weight: 73 kg (160 lb 15 oz) IBW/kg (Calculated) : 50.1 Heparin Dosing Weight: 65.7 kg  Vital Signs: Temp: 98.4 F (36.9 C) (09/07 1939) Temp Source: Oral (09/07 1508) BP: 159/34 (09/07 1939) Pulse Rate: 79 (09/07 1939)  Labs: Recent Labs    10/17/22 1150 10/17/22 1211 10/17/22 1459 10/17/22 2125 10/18/22 0811 10/18/22 1806  HGB 11.4* 12.9  --   --  10.3*  --   HCT 38.6 38.0  --   --  36.2  --   PLT 241  --   --   --  221  --   LABPROT 25.6*  --   --  18.5*  --   --   INR 2.3*  --   --  1.5*  --   --   HEPARINUNFRC  --   --   --   --   --  0.18*  CREATININE 0.98 1.00  --   --  1.27*  --   TROPONINIHS 20*  --  19*  --   --   --     Estimated Creatinine Clearance: 36.9 mL/min (A) (by C-G formula based on SCr of 1.27 mg/dL (H)).   Medications:  Medications Prior to Admission  Medication Sig Dispense Refill Last Dose   ALPRAZolam (XANAX) 1 MG tablet Take 1/2 - 1 tablet (1/2  - 1 mg total) by mouth at bedtime as needed for anxiety or sleep. May take 1/2 tablet during the day if needed for anxiety 45 tablet 0 10/16/2022   buPROPion (WELLBUTRIN XL) 150 MG 24 hr tablet Take 1 tablet (150 mg total) by mouth every morning. 90 tablet 3 10/16/2022   DULoxetine (CYMBALTA) 60 MG capsule Take 1 capsule (60 mg total) by mouth 2 (two) times daily. 180 capsule 3 10/16/2022    Fluticasone-Umeclidin-Vilant (TRELEGY ELLIPTA) 100-62.5-25 MCG/ACT AEPB Inhale 1 puff into the lungs daily. 180 each 4 10/16/2022   furosemide (LASIX) 80 MG tablet Take 1 tablet (80 mg total) by mouth every morning. 90 tablet 3 10/16/2022   gabapentin (NEURONTIN) 600 MG tablet Take 600 mg by mouth at bedtime.   10/16/2022   HYDROcodone-acetaminophen (NORCO/VICODIN) 5-325 MG tablet Take 1-2 tablets by mouth 3 (three) times daily. (Patient taking differently: Take 2 tablets by mouth every 6 (six) hours as needed for moderate pain.) 180 tablet 0 10/16/2022   insulin degludec (TRESIBA FLEXTOUCH) 100 UNIT/ML FlexTouch Pen Inject 44 Units into the skin daily. 13 mL 5 10/16/2022   linaclotide (LINZESS) 72 MCG capsule Take 1 capsule (72 mcg total) by mouth daily before breakfast. 30 capsule 1 10/16/2022   metoprolol succinate (TOPROL XL) 25 MG 24 hr tablet Take 1 tablet (25 mg total) by mouth daily. 30 tablet 11 10/16/2022   warfarin (COUMADIN) 2.5 MG tablet Take 1 tablet by mouth daily unless otherwise instructed. 90 tablet 3 10/16/2022   albuterol (PROAIR HFA) 108 (90 Base) MCG/ACT inhaler Inhale 2  puffs into the lungs every 6 (six) hours as needed for shortness of breath. 6.7 g 5    cetirizine (ZYRTEC ALLERGY) 10 MG tablet Take 1 tablet (10 mg total) by mouth daily. 90 tablet 3    Continuous Glucose Sensor (FREESTYLE LIBRE 3 SENSOR) MISC Place 1 sensor on the skin every 14 days. Use to check glucose 6 times daily as directed 6 each 3    ipratropium (ATROVENT) 0.03 % nasal spray Instill 2 sprays into the nose 3 (three) times daily as directed 30 mL 2    ipratropium-albuterol (DUONEB) 0.5-2.5 (3) MG/3ML SOLN Inhale 3 mLs by nebulization every 6 (six) hours as needed (shortness of breath, wheezing). 90 mL 3    ketoconazole (NIZORAL) 2 % cream Apply 1 application topically at bedtime as needed for irritation. 60 g 5    lidocaine-prilocaine (EMLA) cream Apply 1 Application topically as needed. 30 g 0    ondansetron (ZOFRAN) 4  MG tablet Take 1 tablet (4 mg total) by mouth every 8 (eight) hours as needed for nausea or vomiting. 21 tablet 0    OXYGEN Inhale 2 L into the lungs at bedtime.      potassium chloride SA (KLOR-CON M) 20 MEQ tablet Take 1 tablet (20 mEq total) by mouth daily. 90 tablet 3    rosuvastatin (CRESTOR) 20 MG tablet Take 1 tablet (20 mg total) by mouth at bedtime. 90 tablet 3    Semaglutide, 1 MG/DOSE, 4 MG/3ML SOPN Inject 1 mg into the skin once a week. 3 mL 5     Assessment: [name] is a 72 y.o. 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 Patient is admitted after a fall with hip fracture.Ortho plans IMN 10/19/22, pending INR. Vitamin K IV 5 mg given. INR down to 1.5.  Initial heparin level drawn ~ 6 hours was subtherapeutic at 0.18 on 950 units/hr  Goal of Therapy:  Heparin level 0.3-0.7 units/ml Monitor platelets by anticoagulation protocol: Yes   Plan:  Increase heparin infusion to 1100 units/hr Check anti-Xa level in 8 hours and daily while on heparin Continue to monitor H&H and platelets F/u post-op [facility] plans   Thank you for allowing pharmacy to be a part of this patient's care.   Signe Colt, PharmD 10/18/2022 8:11 PM  **Pharmacist phone directory can be found on [email or web address] listed under [facility]**

## 2022-10-18 NOTE — Consult Note (Signed)
ANTICOAGULATION CONSULT NOTE - Initial Consult  Pharmacy Consult for heparin  Indication: artificial mitral valve  Allergies  Allergen Reactions   Dexilant [Dexlansoprazole] Other (See Comments)    Made acid reflux worse   Lorazepam Other (See Comments)    Patient's sister noted that ativan caused the patient to become extremely confused during hospitalization 09/2010; tolerates Xanax   Oxycontin [Oxycodone] Other (See Comments)    headache   Tramadol Hcl Swelling    Ankle swelling    Patient Measurements: Height: 5\' 2"  (157.5 cm) Weight: 73 kg (160 lb 15 oz) IBW/kg (Calculated) : 50.1 Heparin Dosing Weight: 65.7  Vital Signs: Temp: 98 F (36.7 C) (09/07 0757) Temp Source: Oral (09/07 0757) BP: 138/112 (09/07 0757) Pulse Rate: 100 (09/07 0757)  Labs: Recent Labs    10/17/22 1150 10/17/22 1211 10/17/22 1459 10/17/22 2125 10/18/22 0811  HGB 11.4* 12.9  --   --  10.3*  HCT 38.6 38.0  --   --  36.2  PLT 241  --   --   --  221  LABPROT 25.6*  --   --  18.5*  --   INR 2.3*  --   --  1.5*  --   CREATININE 0.98 1.00  --   --  1.27*  TROPONINIHS 20*  --  19*  --   --     Estimated Creatinine Clearance: 36.9 mL/min (A) (by C-G formula based on SCr of 1.27 mg/dL (H)).   Medical History: Past Medical History:  Diagnosis Date   Abnormality of lung on CXR 02/02/2020   Nonspecific finding on CXR ordered by pulmonologist - c/w inflammation vs infection, f/u imaging suggested, Dr. Roxy Cedar office has been in communication about recommended next steps.   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   Non-bleeding when visualized on capsule endoscopy 06/30/2015    Arthritis    "lower back; hands" (02/19/2018)   Asthma    Asymptomatic cholelithiasis 09/25/2015   Seen on CT scan 08/2015   Carotid artery stenosis; s/p R endarterectomy    s/p right  endarterectomy (06/2010) Carotid US (07/2010):  Left: Moderate-to-severe (60-79%) calcific and non-calcific plaque origin and proximal ICA and ECA    Chronic congestive heart failure with left ventricular diastolic dysfunction (HCC) 10/21/2010   Chronic constipation 02/03/2011   Chronic daily headache 01/16/2014   Chronic iron deficiency anemia    Chronic low back pain 10/06/2012   Chronic venous insufficiency 08/04/2012   Closed fracture of one rib of left side 08/23/2021   ED visit after a fall 08/16/21    COPD (chronic obstructive pulmonary disease) with emphysema (HCC)    PFTs 2018: severe obstructive disease, insignif response to bronchiodilator, mild restriction parenchymal pattern, moderately severe diffusion defect. 2014  FEV1 0.92 (40%), ratio 69, 27% increase in FEV1 with BD, TLC 91%, severe airtrapping, DLCO49% On chronic home O2. Pulmonary rehab referral 05/2012    Dyspnea    Fibromyalgia 08/29/2010   Gastroesophageal reflux disease    History of blood transfusion    "several times"  (02/19/2018)   History of clear cell renal cell carcinoma (HCC), in remission 07/21/2011   s/p cryoablation of left RCC in 09/2011 by Dr. Fredia Sorrow. Followed by Dr. Retta Diones  [facility]) .     History of hiatal hernia    History of mitral valve replacement with bioprosthetic valve due to mitral stenosis 2012  s/p MVR with a 27-mm pericardial porcine valve ([employer]tronic Mosaic valve, serial [id number] on 09/20/10, Dr. Donata Clay)    History of obstructive sleep apnea, resolved 2013   resolved per sleep study 07/2019; no apnea, but did have desaturation.  CPAP no longer necessary.  Nocturnal polysomnography (06/2009): Moderate sleep apnea/ hypopnea syndrome , AHI 17.8 per hour with nonpositional hypopneas. CPAP titration to 12 CWP, AHI 2.4 per hour. On nocturnal CPAP via a small resMed Quattro full-face mask with heated humidifier.    History of pneumonia    "once"  (02/19/2018)   History of seborrheic  keratosis 09/28/2015   History of stroke without residual deficits 01/17/2022   Hyperlipidemia LDL goal < 100 11/20/2005   Internal and external hemorrhoids without complication 08/04/2012   Lesion of left native kidney 06/01/2020   Incidental finding on recent screening chest CT for lung ca ordered by Dr. Maple Hudson pulmonology - he has ordered a f/u renal U/S.    Lichen sclerosus of female genitalia 01/12/2017   Migraine    "none in years" (02/19/2018)   Mild cognitive impairment with memory loss 12/23/2021   Moderately severe major depression (HCC) 11/19/2005   Nocturnal hypoxia per sleep study 07/2019    Osteoporosis    DEXA 2016: T -2.7; DEXA (12/09/2011): L-spine T -3.7, left hip T -1.4 DEXA (12/2004): L-spine T -2.6, left hip -0.1    Paroxysmal atrial fibrillation (HCC) 10/22/2010   s/p Left atrial maze procedure for paroxysmal atrial fibrillation on 09/20/2010 by Dr Donata Clay.  Subsequent splenic infarct, decision was made to re-anticoagulate with coumadin, likely life-long as this is the most likely cause of the splenic infarct.    Personal history of colonic polyps 05/14/2011   Colonoscopy (05/2011): 4 mm adenomatous polyp excised endoscopically Colonoscopy (02/2002): Adenomatous polyp excised endoscopically    Personal history of renal cell carcinoma 09/12/2020   Pneumonia    Pulmonary hypertension due to chronic obstructive pulmonary disease (HCC) 04/25/2016   2014 TEE w PA peak pressure 46 mmHg, s/p MV replacement    Right nephrolithiasis, asymptomatic, incidental finding 09/06/2014   5 mm non-obstructing calculus seen on CT scan 09/05/2014    Right ventricular failure (HCC) 04/25/2016   Severe obesity (BMI 35.0-39.9) with comorbidity (HCC) 10/23/2011   Sleep apnea    Stage 2 chronic kidney disease due to type 2 diabetes mellitus (HCC) 12/16/2018   CKD stage III     Tobacco abuse 07/28/2012   Type 2 diabetes mellitus with diabetic neuropathy (HCC)     Medications: Scheduled:    acetaminophen  1,000 mg Oral Q6H   albuterol  2.5 mg Nebulization Q6H WA   arformoterol  15 mcg Nebulization BID   budesonide (PULMICORT) nebulizer solution  0.25 mg Nebulization BID   buPROPion  150 mg Oral BH-q7a   docusate sodium  100 mg Oral Daily   DULoxetine  60 mg Oral Daily   gabapentin  600 mg Oral QHS   insulin aspart  0-15 Units Subcutaneous TID WC   insulin glargine-yfgn  20 Units Subcutaneous Daily   metoprolol succinate  25 mg Oral Daily   nicotine  14 mg Transdermal Q0600   predniSONE  40 mg Oral Q breakfast   revefenacin  175 mcg Nebulization Daily   rosuvastatin  20 mg Oral QHS   Infusions:   azithromycin 500 mg (10/17/22 2332)   PRN: ALPRAZolam, HYDROmorphone (DILAUDID) injection, mouth rinse, oxyCODONE, polyethylene glycol Anti-infectives (From admission, onward)    Start  Dose/Rate Route Frequency Ordered Stop   10/17/22 2130  azithromycin (ZITHROMAX) 500 mg in sodium chloride 0.9 % 250 mL IVPB        500 mg 250 mL/hr over 60 Minutes Intravenous Every 24 hours 10/17/22 2032 10/20/22 2129       Assessment: 72 y.o. female was admitted on 9/6 due to a fall resulting in a hip fracture. Patient was taking Warfarin outpatient for history of Afib and bioprosthetic mitral heart valve. Warfarin was revered with Vit K. OR plans unclear. Last INR 1.5. CBC stable. Pharmacy asked to consult heparin.    Goal of Therapy:  Heparin level 0.3-0.7 units/ml Monitor platelets by anticoagulation protocol: Yes   Plan:  Start heparin infusion at 950 units/hr Continue to monitor H&H and platelets Check heparin level in 6 hours  Monitor CBC daily   Merla Riches 10/18/2022,9:33 AM

## 2022-10-18 NOTE — Progress Notes (Signed)
HD#1 SUBJECTIVE:  Patient Summary: [name] is a 72 y.o. with a pertinent PMH of iron deficiency anemia, afib and bioprosthetic mitral valve replacement, hx of VA, RCC on remission, diabetes, COPD, sleep apnea, chronic pain, and PAD presenting after a fall resulting in a left intratrochanteric fracture.   Overnight Events: No acute events ON   Interim History:   She was recently admitted on 8/15 for a Hgb of 6.3 after presenting with worsening weakness and anemia. Her baseline is around 10. Unclear why she had anemia. Extensive workup with EGD and colonoscopy, unrevealing. Unlikely for celiac due to age. AVM on capsule endoscopy but non-bleeding. FOBT negative. Follows [facility]. Got IV iron and blood. Hgb was 9.2 at discharge.   She called our clinic and stated she had a brief episode of chest pain radiated down left arm then went away no N/V no other symptoms. Next day she fell asleep on her chair and fell, broke her hip. Was on the floor for about 1 hour and was able to then call EMS.   EMS noted her to be hypoxic requiring 6 L supplemental oxygen. She does report an increased cough, sputum, and wheezing. Has been weaned to 2L now.   OBJECTIVE:  Vital Signs: Vitals:   10/17/22 1730 10/17/22 1800 10/17/22 2020 10/17/22 2110  BP: (!) 129/43 (!) 113/50  (!) 135/48  Pulse: 97 96  98  Resp: (!) 21 19    Temp:  98.3 F (36.8 C)  98.6 F (37 C)  TempSrc:  Oral    SpO2: 93% 90% 95% 96%  Weight:  73 kg    Height:  5\' 2"  (1.575 m)     Supplemental O2: Nasal Cannula SpO2: 96 % O2 Flow Rate (L/min): 2 L/min  Filed Weights   10/17/22 1141 10/17/22 1800  Weight: 70.8 kg 73 kg    No intake or output data in the 24 hours ending 10/18/22 0536 Net IO Since Admission: No IO data has been entered for this period [10/18/22 0536]  Physical Exam: Physical Exam Constitutional:      Appearance: She is obese. She is ill-appearing.  Cardiovascular:     Rate and Rhythm: Regular  rhythm. Tachycardia present.     Heart sounds: Murmur heard.     No friction rub. No gallop.  Pulmonary:     Effort: No respiratory distress.     Breath sounds: No wheezing, rhonchi or rales.  Abdominal:     General: There is no distension.     Palpations: Abdomen is soft.     Tenderness: There is no abdominal tenderness. There is no guarding.  Musculoskeletal:        General: Tenderness and signs of injury present.     Right lower leg: No edema.     Left lower leg: No edema.  Skin:    General: Skin is warm.     Capillary Refill: Capillary refill takes less than 2 seconds.     Coloration: Skin is not jaundiced.  Neurological:     Sensory: No sensory deficit.    Patient Lines/Drains/Airways Status     Active Line/Drains/Airways     Name Placement date Placement time Site Days   Peripheral IV 10/17/22 20 G Anterior;Left Forearm 10/17/22  1149  Forearm  1   Peripheral IV 10/17/22 22 G Left;Posterior Hand 10/17/22  --  Hand  1   Wound / Incision (Open or Dehisced) 09/25/22 Irritant Dermatitis (Moisture Associated Skin Damage) Groin Bilateral  red 09/25/22  1334  Groin  23   Wound / Incision (Open or Dehisced) 09/25/22 Irritant Dermatitis (Moisture Associated Skin Damage) Abdomen Lower;Medial;Mid red 09/25/22  1334  Abdomen  23             ASSESSMENT/PLAN:  Assessment: Principal Problem:   Closed left hip fracture, initial encounter (HCC) Active Problems:   Severe major depression (HCC)   Peripheral arterial disease (HCC)   Type 2 diabetes mellitus with other specified complication (HCC)   Chronic CHF (congestive heart failure) (HCC)   PAF (paroxysmal atrial fibrillation) (HCC)   Acute hypoxic respiratory failure (HCC)   Tobacco use disorder   COPD exacerbation (HCC)   Encephalopathy   OSA (obstructive sleep apnea)   Plan:  Close left hip fracture 2/2 fall -Surgery canceled for today pending medical work-up. Patient had chest pain and is high risk (see below). We  appreciate Ortho's involvement. Would need to do further medical work-up to determine if this patient is stable enough for surgery. Focusing on pain management for now.  -Tylenol 1000mg  q6hrs  -Oxycodone 5mg  q4hrs  -Dilaudid for breakthrough pain.  -Vitamin D level pending.  Anticipate vitamin D and calcium supplementation and bisphosphonate at some point.  #Chest pain Has an episode of chest pain that radiated down her left arm the day prior to her fall. States that it is atypical for her to have it radiate down her arm. She has been under stress at home due to some family issues. The pain has not come back. However, she has been more somnolent at home, which led her to sleep in the kitchen (she does not usually fall asleep there) and resulted in her fall. Could be cardiac in nature. Troponins have been elevated but flat 20/19. EKG Showed PVCs that were new and an old anteroseptal infarct. No N/V or other associated symptoms. Event resolved, but should be assessed further for due to it being different than her past chest pain episodes. Previously, when she had Chest pain, these were thought to be due to demand ischemia, as angiography showed non-obstructive CAD (04/27/2021 admission) and catheterization showed elevated filling pressures in both ventricles. She does have a hx of HFpEF (last echo 01/18/2022 EF 60-65% mild LVH with increased pulmonary pressures.) No concerns for PE and severe obstructive lung disease on PFTs. Would improve with diuresis. She is high risk for surgery.  -Cardio consulted  -Appreciate their recs   #On Warfarin due to hx splenic infarction due to embolism 2/2 heart procedure #Bioprosthetic Mitral Valve  #Hx of Paroxysmal Afib She is on warfarin  Per Dr. Vilinda Blanks note: " Indication is for history of splenic infarction due to embolism complicating a heart procedure in 2012-2013. Warfarin was begun and she was told she'd remain on it "for the rest of my life'. She has been  resistant to a DOAC because she hasn't understood that these new medications are indicated as alternatives."  Bridging with heparin as her CHA2DS2-VASc score is 8. Stroke risk was 10.8% per year in >90,000 patients (the [place] Atrial Fibrillation Cohort Study) and 15.2% risk of stroke/TIA/systemic embolism. RCRI score is 4, Perioperative Risk of Major Cardiac Event is 11%. Cardiology consulted. Appreciate their involvement and help with this case.  -Encourage smoking cessation (>60 pack year history) -Heparin bridge  Acute hypoxic respiratory failure Pulmonary Hypertension COPD exacerbation Stable on low-flow nasal cannula. Weaning off oxygen. Given COPD, and risk of reducing respiratory drive when O2 is >65%, attempt to keep O2 sat between  88-92%. Wheezing resolved today, imaging showed mild edema, atelectasis, questionable pneumonitis. Increased sputum but not changed in color (yellow white), SOB, requiring O2 (none at baseline just during her sleep with CPAP). She is not grossly volume overloaded.  CTA chest without PE but does show some interstitial infiltrates. Treat with bronchodilators, prednisone, azithromycin for COPD exacerbation with sputum. S/p 1 dose of IV Lasix to treat concomitant subclinical heart failure syndrome.  Low suspicion for community-acquired pneumonia at present. - cw Azithromycin 500 mg x 3 days - cw Prednisone 40 mg x 5 days - cw Revefenacin, formoterol, budesonide via neb - cw Albuterol via neb every 6 hours while awake  HFpEF (last echo 01/18/2022 EF 60-65% mild LVH with increased pulmonary pressures -S/p diureses yesterday with Furosemide 40mg  IV.  -holding home furosemide. -K>4 and Mg>2 -daily weights -I/Os - Admit to med telemetry   Mild encephalopathy Mild Cognitive impairment with memory loss Larey Seat out of her kitchen chair at home after falling asleep at the table, which is unusual for her.  She is somewhat inattentive on my exam.  VBG to evaluate for  hypercapnia in this person with COPD and OSA, this was normal.  Wonder about hypoxic encephalopathy.  Does not sound like syncope or seizure.  Again, doubt sepsis. - Delirium precautions  OSA  Could be why she is so somnolent at home during the day. Reports compliance with CPAP with bleed O2. Cw same.   Type 2 diabetes on insulin - Resumed diet today. Not NPO anymore. 24 more units given today. - NPO at midnight  - 20 units tomorrow AM.    Paroxysmal A-fib Sinus rhythm at present.     Severe major depression Anxiety Continue home medications. - Duloxetine, bupropion - 0.5 to 1 mg Xanax as needed at bedtime   Peripheral arterial disease Lower extremities bilaterally with decent perfusion on exam today. - Continue rosuvastatin   Tobacco use disorder Seems precontemplative regarding quitting. - Nicotine replacement   Level of care: Med telemetry Diet: N.p.o. at midnight IVF: N/A VTE: SCDs Start: 10/17/22 1637 Code: Full Surrogate: Daughter, Elliot Gurney,  Manuela Neptune, MD    10/18/2022 12:47 PM

## 2022-10-19 ENCOUNTER — Inpatient Hospital Stay (HOSPITAL_COMMUNITY): Payer: PPO | Admitting: Anesthesiology

## 2022-10-19 ENCOUNTER — Encounter (HOSPITAL_COMMUNITY)
Admission: EM | Disposition: A | Discharge status: Skilled Nursing Facility | Payer: Self-pay | Source: Home / Self Care | Attending: Internal Medicine

## 2022-10-19 ENCOUNTER — Encounter (HOSPITAL_COMMUNITY): Payer: Self-pay | Admitting: Internal Medicine

## 2022-10-19 ENCOUNTER — Inpatient Hospital Stay (HOSPITAL_COMMUNITY): Payer: PPO

## 2022-10-19 ENCOUNTER — Other Ambulatory Visit: Payer: Self-pay

## 2022-10-19 ENCOUNTER — Inpatient Hospital Stay (HOSPITAL_COMMUNITY): Payer: Self-pay | Admitting: Anesthesiology

## 2022-10-19 DIAGNOSIS — I480 Paroxysmal atrial fibrillation: Secondary | ICD-10-CM

## 2022-10-19 DIAGNOSIS — E785 Hyperlipidemia, unspecified: Secondary | ICD-10-CM

## 2022-10-19 DIAGNOSIS — S72002A Fracture of unspecified part of neck of left femur, initial encounter for closed fracture: Secondary | ICD-10-CM | POA: Diagnosis not present

## 2022-10-19 DIAGNOSIS — G4733 Obstructive sleep apnea (adult) (pediatric): Secondary | ICD-10-CM | POA: Diagnosis not present

## 2022-10-19 HISTORY — PX: INTRAMEDULLARY (IM) NAIL INTERTROCHANTERIC: SHX5875

## 2022-10-19 LAB — CBC
HCT: 34.7 % — ABNORMAL LOW (ref 36.0–46.0)
Hemoglobin: 10 g/dL — ABNORMAL LOW (ref 12.0–15.0)
MCH: 24.5 pg — ABNORMAL LOW (ref 26.0–34.0)
MCHC: 28.8 g/dL — ABNORMAL LOW (ref 30.0–36.0)
MCV: 85 fL (ref 80.0–100.0)
Platelets: 193 10*3/uL (ref 150–400)
RBC: 4.08 MIL/uL (ref 3.87–5.11)
RDW: 23.1 % — ABNORMAL HIGH (ref 11.5–15.5)
WBC: 11.5 10*3/uL — ABNORMAL HIGH (ref 4.0–10.5)
nRBC: 0 % (ref 0.0–0.2)

## 2022-10-19 LAB — BASIC METABOLIC PANEL
Anion gap: 10 (ref 5–15)
BUN: 25 mg/dL — ABNORMAL HIGH (ref 8–23)
CO2: 30 mmol/L (ref 22–32)
Calcium: 9.3 mg/dL (ref 8.9–10.3)
Chloride: 101 mmol/L (ref 98–111)
Creatinine, Ser: 1.2 mg/dL — ABNORMAL HIGH (ref 0.44–1.00)
GFR, Estimated: 48 mL/min — ABNORMAL LOW (ref >60)
Glucose, Bld: 162 mg/dL — ABNORMAL HIGH (ref 70–99)
Potassium: 4.6 mmol/L (ref 3.5–5.1)
Sodium: 141 mmol/L (ref 135–145)

## 2022-10-19 LAB — GLUCOSE, CAPILLARY
Glucose-Capillary: 138 mg/dL — ABNORMAL HIGH (ref 70–99)
Glucose-Capillary: 102 mg/dL — ABNORMAL HIGH (ref 70–99)
Glucose-Capillary: 110 mg/dL — ABNORMAL HIGH (ref 70–99)
Glucose-Capillary: 92 mg/dL (ref 70–99)
Glucose-Capillary: 123 mg/dL — ABNORMAL HIGH (ref 70–99)
Glucose-Capillary: 116 mg/dL — ABNORMAL HIGH (ref 70–99)
Glucose-Capillary: 141 mg/dL — ABNORMAL HIGH (ref 70–99)

## 2022-10-19 LAB — PROTIME-INR
INR: 1.1 (ref 0.8–1.2)
Prothrombin Time: 14.5 s (ref 11.4–15.2)

## 2022-10-19 LAB — HEPARIN LEVEL (UNFRACTIONATED): Heparin Unfractionated: 0.26 [IU]/mL — ABNORMAL LOW (ref 0.30–0.70)

## 2022-10-19 SURGERY — FIXATION, FRACTURE, INTERTROCHANTERIC, WITH INTRAMEDULLARY ROD
Anesthesia: General | Site: Leg Upper | Laterality: Left

## 2022-10-19 MED ORDER — PHENYLEPHRINE HCL-NACL 20-0.9 MG/250ML-% IV SOLN
INTRAVENOUS | Status: DC | PRN
  Administered 2022-10-19: 40 ug/min via INTRAVENOUS

## 2022-10-19 MED ORDER — PROPOFOL 10 MG/ML IV BOLUS
INTRAVENOUS | Status: AC
  Filled 2022-10-19: qty 20

## 2022-10-19 MED ORDER — ACETAMINOPHEN 10 MG/ML IV SOLN
INTRAVENOUS | Status: AC
  Filled 2022-10-19: qty 100

## 2022-10-19 MED ORDER — ALBUTEROL SULFATE (2.5 MG/3ML) 0.083% IN NEBU
2.5000 mg | INHALATION_SOLUTION | Freq: Once | RESPIRATORY_TRACT | Status: AC
  Administered 2022-10-19: 2.5 mg via RESPIRATORY_TRACT

## 2022-10-19 MED ORDER — ENOXAPARIN SODIUM 40 MG/0.4ML IJ SOSY
40.0000 mg | PREFILLED_SYRINGE | INTRAMUSCULAR | Status: DC
  Administered 2022-10-19: 40 mg via SUBCUTANEOUS
  Filled 2022-10-19: qty 0.4

## 2022-10-19 MED ORDER — LACTATED RINGERS IV SOLN
INTRAVENOUS | Status: DC | PRN
Start: 2022-10-19 — End: 2022-10-19

## 2022-10-19 MED ORDER — LIDOCAINE 2% (20 MG/ML) 5 ML SYRINGE
INTRAMUSCULAR | Status: DC | PRN
  Administered 2022-10-19: 80 mg via INTRAVENOUS

## 2022-10-19 MED ORDER — INSULIN ASPART 100 UNIT/ML IJ SOLN: 0.0000 [IU] | INTRAMUSCULAR | Status: DC | PRN

## 2022-10-19 MED ORDER — TRANEXAMIC ACID-NACL 1000-0.7 MG/100ML-% IV SOLN
INTRAVENOUS | Status: AC
  Filled 2022-10-19: qty 100

## 2022-10-19 MED ORDER — ACETAMINOPHEN 10 MG/ML IV SOLN
INTRAVENOUS | Status: DC | PRN
  Administered 2022-10-19: 1000 mg via INTRAVENOUS

## 2022-10-19 MED ORDER — ROCURONIUM BROMIDE 10 MG/ML (PF) SYRINGE
PREFILLED_SYRINGE | INTRAVENOUS | Status: DC | PRN
  Administered 2022-10-19: 60 mg via INTRAVENOUS

## 2022-10-19 MED ORDER — TRANEXAMIC ACID 1000 MG/10ML IV SOLN
INTRAVENOUS | Status: DC | PRN
  Administered 2022-10-19: 1000 mg via INTRAVENOUS

## 2022-10-19 MED ORDER — ONDANSETRON HCL 4 MG/2ML IJ SOLN: 4.0000 mg | Freq: Once | INTRAMUSCULAR | Status: DC | PRN

## 2022-10-19 MED ORDER — FENTANYL CITRATE (PF) 100 MCG/2ML IJ SOLN: 25.0000 ug | INTRAMUSCULAR | Status: DC | PRN

## 2022-10-19 MED ORDER — FENTANYL CITRATE (PF) 250 MCG/5ML IJ SOLN
INTRAMUSCULAR | Status: DC | PRN
  Administered 2022-10-19 (×2): 50 ug via INTRAVENOUS

## 2022-10-19 MED ORDER — PROPOFOL 10 MG/ML IV BOLUS
INTRAVENOUS | Status: DC | PRN
Start: 2022-10-19 — End: 2022-10-19
  Administered 2022-10-19: 100 mg via INTRAVENOUS

## 2022-10-19 MED ORDER — ORAL CARE MOUTH RINSE: 15.0000 mL | Freq: Once | OROMUCOSAL | Status: AC

## 2022-10-19 MED ORDER — FENTANYL CITRATE (PF) 250 MCG/5ML IJ SOLN
INTRAMUSCULAR | Status: AC
  Filled 2022-10-19: qty 5

## 2022-10-19 MED ORDER — ALPRAZOLAM 0.5 MG PO TABS
0.5000 mg | ORAL_TABLET | Freq: Two times a day (BID) | ORAL | Status: DC | PRN
  Administered 2022-10-19: 0.5 mg via ORAL
  Filled 2022-10-19: qty 1

## 2022-10-19 MED ORDER — ALBUTEROL SULFATE (2.5 MG/3ML) 0.083% IN NEBU
INHALATION_SOLUTION | RESPIRATORY_TRACT | Status: AC
  Filled 2022-10-19: qty 3

## 2022-10-19 MED ORDER — PHENYLEPHRINE 80 MCG/ML (10ML) SYRINGE FOR IV PUSH (FOR BLOOD PRESSURE SUPPORT)
PREFILLED_SYRINGE | INTRAVENOUS | Status: DC | PRN
  Administered 2022-10-19 (×2): 80 ug via INTRAVENOUS

## 2022-10-19 MED ORDER — 0.9 % SODIUM CHLORIDE (POUR BTL) OPTIME
TOPICAL | Status: DC | PRN
  Administered 2022-10-19: 1000 mL

## 2022-10-19 MED ORDER — CHLORHEXIDINE GLUCONATE 0.12 % MT SOLN
15.0000 mL | Freq: Once | OROMUCOSAL | Status: AC
  Administered 2022-10-19: 15 mL via OROMUCOSAL

## 2022-10-19 MED ORDER — LACTATED RINGERS IV SOLN: INTRAVENOUS | Status: DC

## 2022-10-19 MED ORDER — CEFAZOLIN SODIUM-DEXTROSE 1-4 GM/50ML-% IV SOLN
1.0000 g | Freq: Three times a day (TID) | INTRAVENOUS | Status: AC
  Administered 2022-10-19 – 2022-10-20 (×3): 1 g via INTRAVENOUS
  Filled 2022-10-19 (×4): qty 50

## 2022-10-19 MED ORDER — CEFAZOLIN SODIUM-DEXTROSE 1-4 GM/50ML-% IV SOLN
INTRAVENOUS | Status: DC | PRN
  Administered 2022-10-19: 1 g via INTRAVENOUS

## 2022-10-19 SURGICAL SUPPLY — 54 items
ADH SKN CLS LQ APL DERMABOND (GAUZE/BANDAGES/DRESSINGS) ×1
APL SKNCLS STERI-STRIP NONHPOA (GAUZE/BANDAGES/DRESSINGS) ×1
BAG COUNTER SPONGE SURGICOUNT (BAG) ×1 IMPLANT
BAG SPNG CNTER NS LX DISP (BAG) ×1
BENZOIN TINCTURE PRP APPL 2/3 (GAUZE/BANDAGES/DRESSINGS) ×1 IMPLANT
BIT DRILL INTERTAN LAG SCREW (BIT) IMPLANT
BIT DRILL LONG 4.0 (BIT) IMPLANT
BOOTCOVER CLEANROOM LRG (PROTECTIVE WEAR) ×2 IMPLANT
CLSR STERI-STRIP ANTIMIC 1/2X4 (GAUZE/BANDAGES/DRESSINGS) ×1 IMPLANT
COVER PERINEAL POST (MISCELLANEOUS) ×1 IMPLANT
COVER SURGICAL LIGHT HANDLE (MISCELLANEOUS) ×1 IMPLANT
DERMABOND ADVANCED .7 DNX6 (GAUZE/BANDAGES/DRESSINGS) IMPLANT
DRAPE C-ARMOR (DRAPES) IMPLANT
DRAPE STERI IOBAN 125X83 (DRAPES) ×1 IMPLANT
DRESSING MEPILEX FLEX 4X4 (GAUZE/BANDAGES/DRESSINGS) ×2 IMPLANT
DRILL BIT LONG 4.0 (BIT) ×1
DRSG MEPILEX FLEX 4X4 (GAUZE/BANDAGES/DRESSINGS) ×2
DRSG MEPILEX POST OP 4X8 (GAUZE/BANDAGES/DRESSINGS) IMPLANT
DRSG TEGADERM 4X4.5 CHG (GAUZE/BANDAGES/DRESSINGS) IMPLANT
DRSG TEGADERM 4X4.75 (GAUZE/BANDAGES/DRESSINGS) IMPLANT
DURAPREP 26ML APPLICATOR (WOUND CARE) ×1 IMPLANT
ELECT CAUTERY BLADE 6.4 (BLADE) ×1 IMPLANT
ELECT REM PT RETURN 9FT ADLT (ELECTROSURGICAL) ×1
ELECTRODE REM PT RTRN 9FT ADLT (ELECTROSURGICAL) ×1 IMPLANT
EVACUATOR 1/8 PVC DRAIN (DRAIN) IMPLANT
FACESHIELD WRAPAROUND (MASK) ×2 IMPLANT
FACESHIELD WRAPAROUND OR TEAM (MASK) ×2 IMPLANT
GAUZE SPONGE 4X4 12PLY STRL (GAUZE/BANDAGES/DRESSINGS) IMPLANT
GAUZE XEROFORM 1X8 LF (GAUZE/BANDAGES/DRESSINGS) IMPLANT
GAUZE XEROFORM 5X9 LF (GAUZE/BANDAGES/DRESSINGS) ×1 IMPLANT
GLOVE BIO SURGEON STRL SZ7 (GLOVE) ×2 IMPLANT
GLOVE BIOGEL PI IND STRL 7.0 (GLOVE) ×1 IMPLANT
GLOVE ORTHO TXT STRL SZ7.5 (GLOVE) ×2 IMPLANT
GOWN STRL SURGICAL XL XLNG (GOWN DISPOSABLE) ×2 IMPLANT
GUIDE PIN 3.2X343 (PIN) ×2
GUIDE PIN 3.2X343MM (PIN) ×2
KIT TURNOVER KIT B (KITS) ×1 IMPLANT
MANIFOLD NEPTUNE II (INSTRUMENTS) ×1 IMPLANT
NAIL TRIGEN INTERTAN 10X18CM (Nail) IMPLANT
NS IRRIG 1000ML POUR BTL (IV SOLUTION) ×1 IMPLANT
PACK GENERAL/GYN (CUSTOM PROCEDURE TRAY) ×1 IMPLANT
PAD ARMBOARD 7.5X6 YLW CONV (MISCELLANEOUS) ×2 IMPLANT
PIN GUIDE 3.2X343MM (PIN) IMPLANT
SCREW LAG COMPR KIT 90/85 (Screw) IMPLANT
SCREW TRIGEN LOW PROF 5.0X27.5 (Screw) IMPLANT
STAPLER VISISTAT 35W (STAPLE) IMPLANT
SUT VIC AB 0 CT1 27 (SUTURE) ×2
SUT VIC AB 0 CT1 27XBRD ANBCTR (SUTURE) ×1 IMPLANT
SUT VIC AB 2-0 CT1 27 (SUTURE) ×2
SUT VIC AB 2-0 CT1 TAPERPNT 27 (SUTURE) IMPLANT
SUT VIC AB 3-0 SH 8-18 (SUTURE) ×1 IMPLANT
TOWEL GREEN STERILE (TOWEL DISPOSABLE) ×1 IMPLANT
TOWEL GREEN STERILE FF (TOWEL DISPOSABLE) ×1 IMPLANT
WATER STERILE IRR 1000ML POUR (IV SOLUTION) ×1 IMPLANT

## 2022-10-19 NOTE — Transfer of Care (Signed)
Immediate Anesthesia Transfer of Care Note  Patient: [name]  Procedure(s) Performed: LEFT INTRAMEDULLARY (IM) NAIL INTERTROCHANTERIC (Left: Leg Upper)  Patient Location: PACU  Anesthesia Type:General  Level of Consciousness: awake, alert , and oriented  Airway & Oxygen Therapy: Patient Spontanous Breathing and Patient connected to nasal cannula oxygen  Post-op Assessment: Report given to RN and Post -op Vital signs reviewed and stable  Post vital signs: Reviewed and stable  Last Vitals:  Vitals Value Taken Time  BP 157/69 10/19/22 0916  Temp 36.4 C 10/19/22 0915  Pulse 80 10/19/22 0926  Resp 20 10/19/22 0926  SpO2 100 % 10/19/22 0926  Vitals shown include unfiled device data.  Last Pain:  Vitals:   10/19/22 0741  TempSrc:   PainSc: 0-No pain      Patients Stated Pain Goal: 3 (10/17/22 1800)  Complications: No notable events documented.

## 2022-10-19 NOTE — Progress Notes (Signed)
Verbal order to hold heparin gtt per MD Marrianne Mood

## 2022-10-19 NOTE — Brief Op Note (Signed)
10/19/2022  9:33 AM  PATIENT:  [name]  72 y.o. female  PRE-OPERATIVE DIAGNOSIS:  LEFT hip fracture  POST-OPERATIVE DIAGNOSIS:  LEFT hip fracture  PROCEDURE:  Procedure(s): LEFT INTRAMEDULLARY (IM) NAIL INTERTROCHANTERIC (Left)  SURGEON:  Surgeons and Role:    * Luci Bank, MD - Primary  ASSISTANTS: none   ANESTHESIA:   general  EBL:  150cc   BLOOD ADMINISTERED:none  DRAINS: none   LOCAL MEDICATIONS USED:  NONE  SPECIMEN:  No Specimen  DISPOSITION OF SPECIMEN:  N/A  COUNTS:  YES  TOURNIQUET:  * No tourniquets in log *  DICTATION: .Note written in EPIC  PLAN OF CARE:  Return to 5N under medical team  PATIENT DISPOSITION:  PACU - hemodynamically stable.   Delay start of Pharmacological VTE agent (>24hrs) due to surgical blood loss or risk of bleeding: no

## 2022-10-19 NOTE — Anesthesia Preprocedure Evaluation (Addendum)
Anesthesia Evaluation  Patient identified by MRN, date of birth, ID band Patient awake    Reviewed: Allergy & Precautions, NPO status , Patient's Chart, lab work & pertinent test results, reviewed documented beta blocker date and time   Airway Mallampati: II  TM Distance: >3 FB Neck ROM: Full    Dental  (+) Dental Advisory Given, Edentulous Upper, Edentulous Lower   Pulmonary asthma , sleep apnea , COPD,  COPD inhaler, Current Smoker   Pulmonary exam normal breath sounds clear to auscultation       Cardiovascular hypertension, Pt. on home beta blockers + Peripheral Vascular Disease (s/p R CEA) and +CHF  Normal cardiovascular exam+ dysrhythmias Atrial Fibrillation + Valvular Problems/Murmurs (s/p MVR)  Rhythm:Regular Rate:Normal     Neuro/Psych  Headaches PSYCHIATRIC DISORDERS Anxiety Depression     Neuromuscular disease CVA, No Residual Symptoms    GI/Hepatic Neg liver ROS, hiatal hernia,GERD  ,,  Endo/Other  diabetes, Type 2, Insulin Dependent    Renal/GU Renal InsufficiencyRenal disease     Musculoskeletal  (+) Arthritis ,  Fibromyalgia -LEFT hip fracture   Abdominal   Peds  Hematology  (+) Blood dyscrasia (Warfarin), anemia Plt 193k   Anesthesia Other Findings Day of surgery medications reviewed with the patient.  Reproductive/Obstetrics                             Anesthesia Physical Anesthesia Plan  ASA: 3  Anesthesia Plan: General   Post-op Pain Management: Ofirmev IV (intra-op)*   Induction: Intravenous  PONV Risk Score and Plan: 2 and Dexamethasone and Ondansetron  Airway Management Planned: Oral ETT  Additional Equipment:   Intra-op Plan:   Post-operative Plan: Extubation in OR  Informed Consent: I have reviewed the patients History and Physical, chart, labs and discussed the procedure including the risks, benefits and alternatives for the proposed anesthesia with the  patient or authorized representative who has indicated his/her understanding and acceptance.     Dental advisory given  Plan Discussed with: CRNA  Anesthesia Plan Comments:         Anesthesia Quick Evaluation

## 2022-10-19 NOTE — Progress Notes (Signed)
Home unit @bedside . Pt able to place on by self but needs assistance adding supplemental O2. Pt will call when ready.   10/19/22 2200  BiPAP/CPAP/SIPAP  BiPAP/CPAP/SIPAP Pt Type Adult  BiPAP/CPAP/SIPAP Resmed  Patient Home Equipment Yes  Auto Titrate Yes

## 2022-10-19 NOTE — Anesthesia Procedure Notes (Signed)
Procedure Name: Intubation Date/Time: 10/19/2022 7:58 AM  Performed by: Gwenyth Allegra, CRNAPre-anesthesia Checklist: Emergency Drugs available

## 2022-10-19 NOTE — Progress Notes (Signed)
Home unit set up w/3L O2 bleed in-line   10/18/22 2300  BiPAP/CPAP/SIPAP  BiPAP/CPAP/SIPAP Pt Type Adult  BiPAP/CPAP/SIPAP Resmed (home unit)  Flow Rate 3 lpm  Patient Home Equipment Yes  Auto Titrate Yes

## 2022-10-19 NOTE — Plan of Care (Signed)
  Problem: Clinical Measurements: Goal: Ability to maintain clinical measurements within normal limits will improve Outcome: Progressing Goal: Will remain free from infection Outcome: Progressing   Problem: Activity: Goal: Risk for activity intolerance will decrease Outcome: Progressing   Problem: Elimination: Goal: Will not experience complications related to urinary retention Outcome: Progressing   Problem: Safety: Goal: Ability to remain free from injury will improve Outcome: Progressing   Problem: Nutritional: Goal: Maintenance of adequate nutrition will improve Outcome: Progressing

## 2022-10-19 NOTE — Progress Notes (Signed)
72 yo female with left IT fracture. Risk assessment and optimization by medical team and cardiology with echo done yesterday.  Plan for left hip IMN this morning. Procedure reviewed with patient again and risks/benefits explained and all questions answered. Patient understands and agrees to proceed with surgery.  Thornell Mule, MD Orthopaedic surgery

## 2022-10-19 NOTE — Anesthesia Procedure Notes (Signed)
Procedure Name: Intubation Date/Time: 10/19/2022 7:59 AM  Performed by: Collene Schlichter, MDPre-anesthesia Checklist: Patient identified, Emergency Drugs available, Suction available and Patient being monitored Patient Re-evaluated:Patient Re-evaluated prior to induction Oxygen Delivery Method: Circle system utilized Preoxygenation: Pre-oxygenation with 100% oxygen Induction Type: IV induction Ventilation: Mask ventilation without difficulty Laryngoscope Size: Mac and 3 Grade View: Grade I Tube type: Oral Tube size: 7.0 mm Number of attempts: 1 Airway Equipment and Method: Stylet Placement Confirmation: ETT inserted through vocal cords under direct vision, positive ETCO2 and breath sounds checked- equal and bilateral Secured at: 22 cm Tube secured with: Tape Dental Injury: Teeth and Oropharynx as per pre-operative assessment

## 2022-10-19 NOTE — Progress Notes (Signed)
Interval history Feeling somewhat tremulous and anxious.  However, surgery went well.  She is having some pain around operative site, but generally pretty well-controlled at present.  She denies shortness of breath and chest pain.  Physical exam Blood pressure (!) 133/41, pulse 85, temperature 98.2 F (36.8 C), temperature source Oral, resp. rate 20, height 5\' 2"  (1.575 m), weight 73 kg, SpO2 92%.  No apparent distress Heart rate and rhythm normal, radial pulse strong Breathing is regular nonlabored, no wheezing over anterior lung fields Abdomen is nontender Skin is warm and dry Alert and oriented, somewhat tremulous with hands outstretched, equal grip strength bilaterally, slightly enlarged right pupil relative to left normal for patient, speech is normal sounding, tongue is midline, palatal elevation is symmetric, upper extremity strength is grossly equal, answering questions and following instructions appropriately  Assessment and plan Hospital day 2  [name] is a 72 y.o. admitted after fall at home with close left intertrochanteric hip fracture, complicated by comorbid COPD, pulmonary hypertension, CHF, valvular atrial fibrillation, diabetes.  Principal Problem:   Closed left hip fracture, initial encounter [facility]) Active Problems:   Severe major depression (HCC)   Peripheral arterial disease (HCC)   Type 2 diabetes mellitus with other specified complication (HCC)   Chronic CHF (congestive heart failure) (HCC)   PAF (paroxysmal atrial fibrillation) (HCC)   Acute hypoxic respiratory failure (HCC)   Tobacco use disorder   Pulmonary hypertension due to chronic obstructive pulmonary disease (HCC)   COPD exacerbation (HCC)   History of mitral valve replacement with bioprosthetic valve   Encephalopathy   OSA (obstructive sleep apnea)   History of atrial fibrillation   Fall   Chronic anticoagulation   Preop examination   Chronic diastolic heart  failure (HCC)   Pulmonary hypertension, unspecified (HCC)  Closed left intertrochanteric hip fracture Status post IMN with orthopedic surgery.  Pain currently well-controlled. - Scheduled Tylenol and oxycodone - As needed Dilaudid  Atrial fibrillation Status post MVR with bioprosthetic mitral valve Sinus rhythm.  On warfarin outside of the hospital.  This was held for surgery.  Bridged with heparin yesterday.  Question when to restart therapeutic anticoagulation for this person at high risk of thromboembolic events.  Holding therapeutically dosed heparin for now, anticipate restarting around 24 hours after IMN based on available evidence.  Still need to confirm with Ortho based on their assessment of bleeding risk from this IMN.  Restart DVT prophylaxis tonight. - Continue metoprolol  COPD with exacerbation Improving, stable today.  Continue bronchodilators and steroids. - Budesonide, formoterol, revefenacin nebulizer - Albuterol via neb twice daily - Prednisone 40 mg x 5 days - Finish azithromycin tomorrow  Chronic congestive heart failure Well compensated at present.  Currently holding home Lasix.  Diabetes on insulin Glucose well-controlled at present. - Continue SSI  Encephalopathy Fall Query mild hypoxic or perhaps hypercapnic encephalopathy caused her to fall asleep at kitchen table and fall at home.  Treating underlying COPD now, mental status improved with more alertness since day of admission.  Chronic stable problems: MDD and anxiety-continue home benzodiazepine, SSRI, and bupropion Hyperlipidemia-continue statin Tobacco use-continue nicotine patch OSA-continue nightly CPAP  Diet: Carb modified IVF: N/A VTE: enoxaparin (LOVENOX) injection 40 mg Start: 10/19/22 1945 SCDs Start: 10/17/22 1637  Code: Full PT/OT recommendations: Pending TOC recommendations: Pending, will enlist for SNF search  Discharge plan: Pending PT OT, ongoing treatment of medical problems,  likely  SNF placement  Marrianne Mood MD 10/19/2022, 6:53 PM  Pager: [phone] After 5pm or weekend: [phone]

## 2022-10-19 NOTE — Op Note (Signed)
.  Orthopaedic Surgery Operative Note (CSN: [id number])  [name]  08-08-1950 Date of Surgery: 10/19/2022   Diagnoses:  LEFT hip intertrochanteric fracture  Procedure: Left hip intramedullary nail   Operative Finding Successful completion of the planned procedure.    Post-operative plan: The patient will be WBAT LLE.  DVT prophylaxis per primary team, hold restarting Coumadin for 48 hours.   Pain control with PRN pain medication preferring oral medicines.  Follow up plan will be scheduled in approximately 14 days for incision check and XR.   Post-Op Diagnosis: Same Surgeons:Primary: Luci Bank, MD Location: [facility] OR ROOM 05 Anesthesia: General Antibiotics: Ancef 1 g, continue ancef 1g q8 hours for 24 hours post op Tourniquet time: N/A Estimated Blood Loss: 150 cc Complications: None Specimens: None Implants: Implant Name Type Inv. Item Serial No. Manufacturer Lot No. LRB No. Used Action  NAIL Otis Peak [id number] - [id number] Nail NAIL [employer] [id number] Left 1 Implanted  SCREW LAG COMPR KIT 90/85 - [id number] Screw SCREW LAG COMPR KIT 90/85  [facility] [employer] [id number] Left 1 Implanted  SCREW TRIGEN LOW PROF 5.0X27.5 - [id number] Screw SCREW TRIGEN LOW PROF 5.0X27.5  [employer] [id number] Left 1 Implanted    Indications for Surgery:   [name] is a 72 y.o. female with left intertrochanteric hip fracture after a ground level fall on 10/17/22. After medical and cardiac risk stratification she was indicated for a left hip IMN.  Benefits and risks of operative and nonoperative management were discussed prior to surgery with patient/guardian(s) and informed consent form was completed.  Specific risks including bleeding, infection, need for additional surgery, painful hardware, failure of fixation, malunion, nonunion and anesthetic complications including stroke, myocardial infarction or  even death.   Procedure:   The patient was met in the preoperative holding area and consent was completed and the operative extremity was marked. She was brought to the operating room and the patient was placed supine on a fracture table and appropriate reduction was obtained and visualized on fluoroscopy prior to the beginning of the procedure.  We made an incision proximal to the greater trochanter and dissected down through the fascia.  We then carefully placed our starting guidwire localizing under fluoroscopy prior to advancing the wire into the bone, The wire was advance to appropriate depth and entry reamer was used.  We selected a length of nail noted above.  At this point we placed our nail localizing under fluoroscopy that it was at the appropriate level prior to using the outrigger device to pass a wire and then the cephalo-medullary screw.  The screw was locked proximally to avoid over collapse.  We took final shots at the proximal femur and then used the outrigger to place one distal interlock screw.  Final pictures were obtained.  The wounds were thoroughly irrigated closed in a multilayer fashion with absorable sutures and the skin closed with staples.  A sterile dressing was placed.  The patient was awoken from general anesthesia and taken to the PACU in stable condition without complication.     Thornell Mule, MD Orthopaedic Surgery

## 2022-10-20 ENCOUNTER — Other Ambulatory Visit: Payer: Self-pay | Admitting: *Deleted

## 2022-10-20 ENCOUNTER — Ambulatory Visit: Payer: Self-pay | Admitting: *Deleted

## 2022-10-20 ENCOUNTER — Encounter (HOSPITAL_COMMUNITY): Payer: Self-pay | Admitting: Orthopedic Surgery

## 2022-10-20 DIAGNOSIS — I739 Peripheral vascular disease, unspecified: Secondary | ICD-10-CM

## 2022-10-20 DIAGNOSIS — I4891 Unspecified atrial fibrillation: Secondary | ICD-10-CM | POA: Diagnosis not present

## 2022-10-20 DIAGNOSIS — F17210 Nicotine dependence, cigarettes, uncomplicated: Secondary | ICD-10-CM | POA: Diagnosis not present

## 2022-10-20 DIAGNOSIS — I70213 Atherosclerosis of native arteries of extremities with intermittent claudication, bilateral legs: Secondary | ICD-10-CM

## 2022-10-20 DIAGNOSIS — S72002A Fracture of unspecified part of neck of left femur, initial encounter for closed fracture: Secondary | ICD-10-CM | POA: Diagnosis not present

## 2022-10-20 DIAGNOSIS — J441 Chronic obstructive pulmonary disease with (acute) exacerbation: Secondary | ICD-10-CM | POA: Diagnosis not present

## 2022-10-20 DIAGNOSIS — I6523 Occlusion and stenosis of bilateral carotid arteries: Secondary | ICD-10-CM

## 2022-10-20 LAB — RENAL FUNCTION PANEL
Albumin: 2.8 g/dL — ABNORMAL LOW (ref 3.5–5.0)
Anion gap: 8 (ref 5–15)
BUN: 16 mg/dL (ref 8–23)
CO2: 25 mmol/L (ref 22–32)
Calcium: 8.8 mg/dL — ABNORMAL LOW (ref 8.9–10.3)
Chloride: 107 mmol/L (ref 98–111)
Creatinine, Ser: 0.91 mg/dL (ref 0.44–1.00)
GFR, Estimated: >60 mL/min (ref >60)
Glucose, Bld: 167 mg/dL — ABNORMAL HIGH (ref 70–99)
Phosphorus: 3.5 mg/dL (ref 2.5–4.6)
Potassium: 4.2 mmol/L (ref 3.5–5.1)
Sodium: 140 mmol/L (ref 135–145)

## 2022-10-20 LAB — PROTIME-INR
INR: 1.1 (ref 0.8–1.2)
Prothrombin Time: 14.9 s (ref 11.4–15.2)

## 2022-10-20 LAB — CBC
HCT: 33.7 % — ABNORMAL LOW (ref 36.0–46.0)
Hemoglobin: 9.7 g/dL — ABNORMAL LOW (ref 12.0–15.0)
MCH: 24.7 pg — ABNORMAL LOW (ref 26.0–34.0)
MCHC: 28.8 g/dL — ABNORMAL LOW (ref 30.0–36.0)
MCV: 86 fL (ref 80.0–100.0)
Platelets: 203 10*3/uL (ref 150–400)
RBC: 3.92 MIL/uL (ref 3.87–5.11)
RDW: 22.4 % — ABNORMAL HIGH (ref 11.5–15.5)
WBC: 9 10*3/uL (ref 4.0–10.5)
nRBC: 0 % (ref 0.0–0.2)

## 2022-10-20 LAB — GLUCOSE, CAPILLARY
Glucose-Capillary: 169 mg/dL — ABNORMAL HIGH (ref 70–99)
Glucose-Capillary: 178 mg/dL — ABNORMAL HIGH (ref 70–99)
Glucose-Capillary: 192 mg/dL — ABNORMAL HIGH (ref 70–99)
Glucose-Capillary: 231 mg/dL — ABNORMAL HIGH (ref 70–99)

## 2022-10-20 LAB — HEPARIN LEVEL (UNFRACTIONATED): Heparin Unfractionated: 0.13 [IU]/mL — ABNORMAL LOW (ref 0.30–0.70)

## 2022-10-20 MED ORDER — HYDROMORPHONE HCL 1 MG/ML IJ SOLN
0.5000 mg | INTRAMUSCULAR | Status: DC | PRN
  Administered 2022-10-20: 0.5 mg via INTRAVENOUS
  Administered 2022-10-21: 1 mg via INTRAVENOUS
  Filled 2022-10-20 (×2): qty 1

## 2022-10-20 MED ORDER — ALPRAZOLAM 0.5 MG PO TABS: 0.5000 mg | ORAL_TABLET | Freq: Once | ORAL | Status: DC | PRN

## 2022-10-20 MED ORDER — INSULIN GLARGINE-YFGN 100 UNIT/ML ~~LOC~~ SOLN
20.0000 [IU] | Freq: Every day | SUBCUTANEOUS | Status: DC
  Administered 2022-10-20: 20 [IU] via SUBCUTANEOUS
  Filled 2022-10-20 (×2): qty 0.2

## 2022-10-20 MED ORDER — ENOXAPARIN SODIUM 80 MG/0.8ML IJ SOSY
70.0000 mg | PREFILLED_SYRINGE | Freq: Two times a day (BID) | INTRAMUSCULAR | Status: DC
  Administered 2022-10-20 – 2022-10-22 (×5): 70 mg via SUBCUTANEOUS
  Filled 2022-10-20 (×6): qty 0.7

## 2022-10-20 MED ORDER — LINACLOTIDE 72 MCG PO CAPS
72.0000 ug | ORAL_CAPSULE | Freq: Every day | ORAL | Status: DC
  Administered 2022-10-20 – 2022-10-22 (×3): 72 ug via ORAL
  Filled 2022-10-20 (×4): qty 1

## 2022-10-20 MED ORDER — ALPRAZOLAM 0.5 MG PO TABS
0.5000 mg | ORAL_TABLET | Freq: Two times a day (BID) | ORAL | Status: DC
  Administered 2022-10-20 – 2022-10-22 (×6): 0.5 mg via ORAL
  Filled 2022-10-20 (×6): qty 1

## 2022-10-20 NOTE — TOC Initial Note (Signed)
Transition of Care [facility]) - Initial/Assessment Note    Patient Details  Name: [name] MRN: [id number] Date of Birth: Jan 24, 1951  Transition of Care [facility]) CM/SW Contact:    Lorri Frederick, LCSW Phone Number: 10/20/2022, 3:44 PM  Clinical Narrative:     CSW met with pt regarding DC recommendation for SNF.  Pt is agreeable to SNF, states she was supposed to go to [facility] several weeks ago but did not due to covid being present at [place].  Pt would like to look at [facility] or [facility] this time.  Pt from home alone, "unsure" about home services.  Permission given to speak with sister Meriam Sprague and daughter Babette Relic.  Referral sent out to [facility] and [facility] and CSW reached out to both facilities to review.                Expected Discharge Plan: Skilled Nursing Facility Barriers to Discharge: Continued Medical Work up, SNF Pending bed offer   Patient Goals and CMS Choice Patient states their goals for this hospitalization and ongoing recovery are:: get back home   Choice offered to / list presented to : Patient (requesting [facility] or [facility])      Expected Discharge Plan and Services In-house Referral: Clinical Social Work   Post Acute Care Choice: Skilled Nursing Facility Living arrangements for the past 2 months: Single Family Home                                      Prior Living Arrangements/Services Living arrangements for the past 2 months: Single Family Home Lives with:: Spouse Patient language and need for interpreter reviewed:: Yes Do you feel safe going back to the place where you live?: Yes      Need for Family Participation in Patient Care: Yes (Comment) Care giver support system in place?: Yes (comment) Current home services:  (unclear) Criminal Activity/Legal Involvement Pertinent to Current Situation/Hospitalization: No - Comment as needed  Activities of Daily Living Home Assistive Devices/Equipment: Bedside commode/3-in-1, CPAP, Blood  pressure cuff, Shower chair without back, Eyeglasses, Grab bars in shower, Grab bars around toilet, Hand-held shower hose, Oxygen, Nebulizer, Raised toilet seat with rails, Reacher, Scales, Walker (specify type), Wheelchair (rasised toilet seat no rails,rollater) ADL Screening (condition at time of admission) Patient's cognitive ability adequate to safely complete daily activities?: Yes Is the patient deaf or have difficulty hearing?: No Does the patient have difficulty seeing, even when wearing glasses/contacts?: No Does the patient have difficulty concentrating, remembering, or making decisions?: No Patient able to express need for assistance with ADLs?: Yes Does the patient have difficulty dressing or bathing?: Yes Independently performs ADLs?: Yes (appropriate for developmental age) (home alone) Does the patient have difficulty walking or climbing stairs?: No Weakness of Legs: Both Weakness of Arms/Hands: Both  Permission Sought/Granted Permission sought to share information with : Family Supports Permission granted to share information with : Yes, Verbal Permission Granted  Share Information with NAME: sister Meriam Sprague, daughter Babette Relic  Permission granted to share info w AGENCY: SNF        Emotional Assessment Appearance:: Appears stated age Attitude/Demeanor/Rapport: Engaged Affect (typically observed): Appropriate, Anxious Orientation: : Oriented to Self, Oriented to Place, Oriented to  Time, Oriented to Situation      Admission diagnosis:  Acute pulmonary edema (HCC) [J81.0] Chronic anticoagulation [Z79.01] History of atrial fibrillation [Z86.79] Closed displaced intertrochanteric fracture of left femur, initial encounter (HCC) [  S72.142A] Fall, initial encounter [id number]] Closed left hip fracture, initial encounter (HCC) [S72.002A] Acute hypoxic respiratory failure (HCC) [J96.01] Patient Active Problem List   Diagnosis Date Noted   History of atrial fibrillation 10/18/2022    Fall 10/18/2022   Chronic anticoagulation 10/18/2022   Preop examination 10/18/2022   Chronic diastolic heart failure (HCC) 10/18/2022   Pulmonary hypertension, unspecified (HCC) 10/18/2022   Closed left hip fracture, initial encounter (HCC) 10/17/2022   Encephalopathy 10/17/2022   OSA (obstructive sleep apnea) 10/17/2022   Generalized pain 08/01/2022   Multifactorial functional impairment 02/13/2022   History of stroke without residual deficits 01/17/2022   Mild cognitive impairment with memory loss 12/23/2021   Diabetic peripheral neuropathy associated with type 2 diabetes mellitus (HCC) 11/11/2021   Recurrent falls 11/11/2021   Gait instability 09/19/2021   Perennial non-allergic rhinitis 09/19/2021   Chronic, continuous use of opioids for chronic pain 07/10/2021   Polypharmacy 11/08/2020   Personal history of renal cell carcinoma 09/12/2020   Tinea pedis of both feet 08/07/2020   Nocturnal hypoxia per sleep study 07/2019    Carotid artery stenosis; s/p R endarterectomy    COPD mixed type (HCC)    Arthritis of both hands 07/01/2019   Pain due to onychomycosis of toenails of both feet 05/27/2019   Stage 2 chronic kidney disease due to type 2 diabetes mellitus (HCC) 12/16/2018   COPD exacerbation (HCC) 11/13/2017   Lichen sclerosus of female genitalia 01/12/2017   Mixed stress and urge urinary incontinence 05/27/2016   Pulmonary hypertension due to chronic obstructive pulmonary disease (HCC) 04/25/2016   Asymptomatic cholelithiasis 09/25/2015   Chronic iron deficiency anemia 12/26/2014   Aortic atherosclerosis (HCC) 10/19/2014   Right nephrolithiasis, asymptomatic (incidental finding) 09/06/2014   Pain of great toes medial nail beds of both feet 05/19/2014   Headache 01/14/2013   Chronic low back pain 10/06/2012   Long term current use of anticoagulant therapy 09/30/2012   Recurrent candidal intertrigo 07/28/2012   Tobacco use disorder 07/28/2012   Acute hypoxic respiratory  failure (HCC) 05/04/2012   Personal history of colonic polyps 05/14/2011   Sleep disturbance 05/14/2011   Abdominal wall hernia 05/14/2011   Constipation due to opioid therapy 02/03/2011   Health care maintenance 11/04/2010   PAF (paroxysmal atrial fibrillation) (HCC) 10/22/2010   Chronic CHF (congestive heart failure) (HCC) 10/21/2010   Fibromyalgia 08/29/2010   Gastroesophageal reflux disease    Type 2 diabetes mellitus with other specified complication (HCC)    Osteoporosis    Peripheral arterial disease (HCC) 05/27/2010   History of mitral valve replacement with bioprosthetic valve 2012   Essential hypertension 06/19/2008   Hyperlipidemia 11/20/2005   Severe major depression (HCC) 11/19/2005   PCP:  Miguel Aschoff, MD Pharmacy:   [facility] [address] [place] [address] Phone: [phone] Fax: [phone]  [id number] [place], [place] - [address] AT [facility] [address] [facility] [address] [place] [address] Phone: [phone] Fax: [phone]  [facility] [address] [place] [address] Phone: [phone] Fax: [phone]     Social Determinants of Health (SDOH) Social History: SDOH Screenings   Food Insecurity: Food Insecurity Present (10/17/2022)  Housing: Patient Declined (10/17/2022)  Transportation Needs: Unmet Transportation Needs (10/17/2022)  Utilities: Not At Risk (10/17/2022)  Alcohol Screen: Low Risk  (05/01/2022)  Depression (PHQ2-9): High Risk (07/01/2022)  Financial Resource Strain: Low Risk  (05/01/2022)  Physical Activity: Inactive (05/01/2022)  Social Connections: Socially Isolated (05/01/2022)  Stress: Stress Concern Present (05/01/2022)  Tobacco Use: High Risk (10/19/2022)   SDOH Interventions:     Readmission Risk Interventions     No data to display

## 2022-10-20 NOTE — Plan of Care (Signed)
  Problem: Education: Goal: Knowledge of General Education information will improve Description: Including pain rating scale, medication(s)/side effects and non-pharmacologic comfort measures Outcome: Progressing   Problem: Health Behavior/Discharge Planning: Goal: Ability to manage health-related needs will improve Outcome: Progressing   Problem: Clinical Measurements: Goal: Ability to maintain clinical measurements within normal limits will improve Outcome: Progressing   Problem: Nutrition: Goal: Adequate nutrition will be maintained Outcome: Progressing   

## 2022-10-20 NOTE — Patient Outreach (Signed)
  Care Coordination   Follow Up Visit Note   10/20/2022 Name: [name] MRN: [id number] DOB: July 13, 1950  [name] is a 72 y.o. year old female who sees [name], [name], MD for primary care. I spoke with  [name] by phone today.  What matters to the patients health and wellness today?  In the hospital- "fell and broke my hip".     Goals Addressed   None     SDOH assessments and interventions completed:  Yes     Care Coordination Interventions:  Yes, provided  Interventions Today    Flowsheet Row Most Recent Value  Chronic Disease   Chronic disease during today's visit Other  [Pt is currently hospitalized after a fall/hip fx and will likely go to SNF rehab at dc.]       Follow up plan: Follow up call scheduled for 10/24/22    Encounter Outcome:  Patient Visit Completed

## 2022-10-20 NOTE — Plan of Care (Signed)

## 2022-10-20 NOTE — Anesthesia Postprocedure Evaluation (Signed)
Anesthesia Post Note  Patient: [name]  Procedure(s) Performed: LEFT INTRAMEDULLARY (IM) NAIL INTERTROCHANTERIC (Left: Leg Upper)     Patient location during evaluation: PACU Anesthesia Type: General Level of consciousness: awake and alert Pain management: pain level controlled Vital Signs Assessment: post-procedure vital signs reviewed and stable Respiratory status: spontaneous breathing, nonlabored ventilation, respiratory function stable and patient connected to nasal cannula oxygen Cardiovascular status: blood pressure returned to baseline and stable Postop Assessment: no apparent nausea or vomiting Anesthetic complications: no   No notable events documented.  Last Vitals:  Vitals:   10/19/22 1900 10/20/22 0007  BP: (!) 147/56 137/60  Pulse: 83 94  Resp: 20 17  Temp: 36.7 C 36.7 C  SpO2: 90% 94%    Last Pain:  Vitals:   10/20/22 0007  TempSrc: Oral  PainSc:                  Collene Schlichter

## 2022-10-20 NOTE — Progress Notes (Signed)
HD#3 SUBJECTIVE:   Interim History:   In lots of pain today. Very anxious and with concern about side-effects of oxycodone.  OBJECTIVE:  Vital Signs: Vitals:   10/19/22 1334 10/19/22 1900 10/20/22 0007 10/20/22 0447  BP: (!) 133/41 (!) 147/56 137/60 (!) 127/54  Pulse: 85 83 94 86  Resp: 20 20 17 17   Temp: 98.2 F (36.8 C) 98 F (36.7 C) 98 F (36.7 C) 98 F (36.7 C)  TempSrc: Oral Oral Oral Oral  SpO2: 92% 90% 94% 91%  Weight:      Height:       Supplemental O2: Nasal Cannula SpO2: 91 % O2 Flow Rate (L/min): 2 L/min  Filed Weights   10/17/22 1141 10/17/22 1800  Weight: 70.8 kg 73 kg    Intake/Output Summary (Last 24 hours) at 10/20/2022 0740 Last data filed at 10/19/2022 2041 Gross per 24 hour  Intake 990 ml  Output 400 ml  Net 590 ml   Net IO Since Admission: 529.52 mL [10/20/22 0740]  Physical Exam: Uncomfortable appearing, upset Heart rate and rhythm normal Breathing is regular and unlabored, no wheezing Skin warm and dry, incision was bandaged and clean underneath without drainage Alert and oriented, no focal deficits, rest tremor bilaterally  Patient Lines/Drains/Airways Status     Active Line/Drains/Airways     Name Placement date Placement time Site Days   Peripheral IV 10/17/22 20 G Anterior;Left Forearm 10/17/22  1149  Forearm  3   Peripheral IV 10/17/22 22 G Left;Posterior Hand 10/17/22  --  Hand  3   External Urinary Catheter 10/18/22  1525  --  2   Incision (Closed) 10/19/22 Hip Left 10/19/22  0716  -- 1   Wound / Incision (Open or Dehisced) 09/25/22 Irritant Dermatitis (Moisture Associated Skin Damage) Groin Bilateral red 09/25/22  1334  Groin  25   Wound / Incision (Open or Dehisced) 09/25/22 Irritant Dermatitis (Moisture Associated Skin Damage) Abdomen Lower;Medial;Mid red 09/25/22  1334  Abdomen  25             ASSESSMENT/PLAN:  Assessment: Principal Problem:   Closed left hip fracture, initial encounter (HCC) Active Problems:    Severe major depression (HCC)   Peripheral arterial disease (HCC)   Type 2 diabetes mellitus with other specified complication (HCC)   Chronic CHF (congestive heart failure) (HCC)   PAF (paroxysmal atrial fibrillation) (HCC)   Acute hypoxic respiratory failure (HCC)   Tobacco use disorder   Pulmonary hypertension due to chronic obstructive pulmonary disease (HCC)   COPD exacerbation (HCC)   History of mitral valve replacement with bioprosthetic valve   Encephalopathy   OSA (obstructive sleep apnea)   History of atrial fibrillation   Fall   Chronic anticoagulation   Preop examination   Chronic diastolic heart failure (HCC)   Pulmonary hypertension, unspecified (HCC)   Plan:  Closed left intertrochanteric hip fracture S/p IMN yesterday morning. Pain not well controlled today. She is concerned about side-effects of pain medicine. Will make some adjustments today, outlined below: -Continue Tylenol 1000mg  q6hrs  -Continue oxycodone 5mg  q4hrs  -Dilaudid 0.5 to 1 mg IV every 3 hours as needed for severe pain or breakthrough pain -Vitamin D level 17.54 Will start Vitamin D supplementation at 1000 international unit daily, encourage PO Ca intake and alendronate at discharge.     Atrial fibrillation Status post MVR with bioprosthetic mitral valve History of embolism 2/2 heart procedure  Warfarin was held for surgery s/p Vitamin K. INR 2.3 at admission  decreased to  1.5. Bridged with heparin before her IMN yesterday.  Heparin was held yesterday and restarted on question when to restart therapeutic anticoagulation for this person at high risk of thromboembolic events.  Currently on Enoxaparin for VTE ppx. CHA2DS2VASC score is 8 and is high risk. Stroke risk is 10.8% per year in >90,000 patients (the [place] Atrial Fibrillation Cohort Study) and 15.2% risk of stroke/TIA/systemic embolism.  DC prophylactic enoxaparin in favor of therapeutic heparin infusion for high thromboembolic risk. - Restart  heparin - Continue metoprolol XL 25 mg daily     COPD with exacerbation Improving, stable today. Continue bronchodilators and steroids. - Budesonide, formoterol, revefenacin nebulizer - Albuterol via neb twice daily - Prednisone 40 mg x 5 days - Finish azithromycin today   Chronic congestive heart failure Well compensated at present.  Currently holding home Lasix due to increase in Cr.  -K>4 and Mg>2 -daily weights -I/Os -med telemetry  Elevated Cr Baseline is ~0.98-1.0  down to 1.2 today 1.27 yesterday. Had not had much PO intake the day of her fall.  -Encourage PO intake  -Trend Cr     Diabetes on insulin Glucose well-controlled at present. Tresiba 44 units daily at home. Was NPO yesterday.  -Restarting 20 units since she was NPO yesterday. -Restart 44 units tomorrow. - Continue SSI   Encephalopathy Fall Query mild hypoxic or perhaps hypercapnic encephalopathy caused her to fall asleep at kitchen table and fall at home.  Treating underlying COPD now, mental status improved with more alertness since day of admission.   OSA  Chronic and stable. Cw with CPAP with bleed O2. Cw same.     Severe major depression Anxiety Continue home medications. - Duloxetine, bupropion - 0.5 to 1 mg Xanax as needed at bedtime   Peripheral arterial disease Lower extremities bilaterally with decent perfusion on exam today. - Continue rosuvastatin   Tobacco use disorder Seems precontemplative regarding quitting. - Nicotine replacement    Best Practice: Diet: Regular diet VTE: enoxaparin (LOVENOX) injection 40 mg Start: 10/19/22 2000 SCDs Start: 10/17/22 1637 Code: Romeo Rabon MD 10/20/2022, 4:44 PM     Please contact the on call pager after 5 pm and on weekends at [phone].

## 2022-10-20 NOTE — Evaluation (Signed)
Physical Therapy Evaluation Patient Details Name: [name] MRN: [id number] DOB: Sep 14, 1950 Today's Date: 10/20/2022  History of Present Illness  Pt is a 72 year old woman admitted on 10/17/22 after falling out of her kitchen chair, resulting in L hip fx. Underwent IM nail on 10/19/22. PMH: COPD on 2L home O2, pulmonary HTN, HTN, CHF, afib, DM2, anxiety, depression, fibromyalgia, arthritis, PVD, chronic back pain, renal cell carcinoma, sleppapnea, CVA, MVR.  Clinical Impression   Pt admitted with above diagnosis. Lives at home alone, in a single-level home with 1 steps to enter; Prior to admission, pt was able to manage overall independently, typically furniture walking at home; Presents to PT with pain L hip, anxiety, functional dependencies;  Needing up to mod assist to come to EOB, stand, and take pivot steps bed to recliner; Patient will benefit from continued inpatient follow up therapy, <3 hours/day; Pt currently with functional limitations due to the deficits listed below (see PT Problem List). Pt will benefit from skilled PT to increase their independence and safety with mobility to allow discharge to the venue listed below.           If plan is discharge home, recommend the following: A lot of help with walking and/or transfers;A little help with bathing/dressing/bathroom;Assistance with cooking/housework;Assist for transportation;Help with stairs or ramp for entrance   Can travel by private vehicle   No    Equipment Recommendations Other (comment) (seems pretty well-equipped)  Recommendations for Other Services       Functional Status Assessment Patient has had a recent decline in their functional status and demonstrates the ability to make significant improvements in function in a reasonable and predictable amount of time.     Precautions / Restrictions Precautions Precautions: Fall Precaution Comments: chronic 2L O2 Restrictions Weight Bearing Restrictions: Yes LLE  Weight Bearing: Weight bearing as tolerated      Mobility  Bed Mobility Overal bed mobility: Needs Assistance Bed Mobility: Supine to Sit     Supine to sit: Min assist, HOB elevated, Used rails     General bed mobility comments: assist to support L LE, increased time    Transfers Overall transfer level: Needs assistance Equipment used: Rolling walker (2 wheels) Transfers: Sit to/from Stand, Bed to chair/wheelchair/BSC Sit to Stand: Min assist   Step pivot transfers: Mod assist (light mod)       General transfer comment: assist to rise and steady, cues for hand placement, pt needing reminders as to which hip was broken, but pain then reminded her    Ambulation/Gait                  Stairs            Wheelchair Mobility     Tilt Bed    Modified Rankin (Stroke Patients Only)       Balance Overall balance assessment: Needs assistance   Sitting balance-Leahy Scale: Fair     Standing balance support: Bilateral upper extremity supported Standing balance-Leahy Scale: Poor Standing balance comment: RW and up to light mod assist                             Pertinent Vitals/Pain Pain Assessment Pain Assessment: Faces Faces Pain Scale: Hurts even more Pain Location: L hip Pain Descriptors / Indicators: Discomfort, Grimacing, Constant, Guarding    Home Living Family/patient expects to be discharged to:: Private residence Living Arrangements: Alone Available Help at Discharge: Family;Available  PRN/intermittently Type of Home: House Home Access: Stairs to enter Entrance Stairs-Rails: None Entrance Stairs-Number of Steps: 1   Home Layout: One level Home Equipment: Teacher, English as a foreign language (2 wheels);Cane - single point;Shower seat - built in;Grab bars - tub/shower;Grab bars - toilet;Hand held shower head      Prior Function Prior Level of Function : Independent/Modified Independent;History of Falls (last six months)              Mobility Comments: Pt reports mostly furniture walking ADLs Comments: Pt reports Ind     Extremity/Trunk Assessment   Upper Extremity Assessment Upper Extremity Assessment: Defer to OT evaluation    Lower Extremity Assessment Lower Extremity Assessment: Generalized weakness;LLE deficits/detail LLE Deficits / Details: Grossly decr AROM and strength LLE, especially hip, limited by pain post fx and surgical repair LLE: Unable to fully assess due to pain    Cervical / Trunk Assessment Cervical / Trunk Assessment: Kyphotic  Communication   Communication Communication: No apparent difficulties  Cognition Arousal: Alert Behavior During Therapy: Anxious, Flat affect Overall Cognitive Status: Impaired/Different from baseline Area of Impairment: Memory, Attention, Following commands, Safety/judgement, Problem solving, Orientation                 Orientation Level: Time Current Attention Level: Focused Memory: Decreased short-term memory Following Commands: Follows one step commands with increased time Safety/Judgement: Decreased awareness of deficits, Decreased awareness of safety   Problem Solving: Slow processing, Decreased initiation, Difficulty sequencing, Requires verbal cues General Comments: pt needing assist to use her cell phone and to place order for breakfast        General Comments General comments (skin integrity, edema, etc.): NAD on 2 L supplemental O2    Exercises     Assessment/Plan    PT Assessment Patient needs continued PT services  PT Problem List Decreased strength;Decreased range of motion;Decreased activity tolerance;Decreased balance;Decreased mobility;Decreased coordination;Decreased cognition;Decreased knowledge of use of DME;Decreased safety awareness;Decreased knowledge of precautions;Cardiopulmonary status limiting activity;Pain       PT Treatment Interventions DME instruction;Gait training;Stair training;Functional mobility  training;Therapeutic activities;Therapeutic exercise;Balance training;Neuromuscular re-education;Cognitive remediation;Patient/family education    PT Goals (Current goals can be found in the Care Plan section)  Acute Rehab PT Goals Patient Stated Goal: Did not specifically state, but agreaable to OOB PT Goal Formulation: With patient Time For Goal Achievement: 11/03/22 Potential to Achieve Goals: Fair    Frequency Min 1X/week     Co-evaluation               AM-PAC PT "6 Clicks" Mobility  Outcome Measure Help needed turning from your back to your side while in a flat bed without using bedrails?: A Lot Help needed moving from lying on your back to sitting on the side of a flat bed without using bedrails?: A Lot Help needed moving to and from a bed to a chair (including a wheelchair)?: A Lot Help needed standing up from a chair using your arms (e.g., wheelchair or bedside chair)?: A Lot Help needed to walk in hospital room?: A Lot Help needed climbing 3-5 steps with a railing? : Total 6 Click Score: 11    End of Session Equipment Utilized During Treatment: Gait belt Activity Tolerance: Patient limited by pain Patient left: in chair;with call bell/phone within reach;with chair alarm set Nurse Communication: Mobility status PT Visit Diagnosis: Unsteadiness on feet (R26.81);Other abnormalities of gait and mobility (R26.89);History of falling (Z91.81);Pain Pain - Right/Left: Left Pain - part of body: Hip    Time: 2130-8657  PT Time Calculation (min) (ACUTE ONLY): 26 min   Charges:   PT Evaluation $PT Eval Moderate Complexity: 1 Mod   PT General Charges $$ ACUTE PT VISIT: 1 Visit         Van Clines, PT  Acute Rehabilitation Services Office [phone] Secure Chat welcomed   Levi Aland 10/20/2022, 2:23 PM

## 2022-10-20 NOTE — Progress Notes (Signed)
.  Subjective: 1 Day Post-Op Procedure(s) (LRB): LEFT INTRAMEDULLARY (IM) NAIL INTERTROCHANTERIC (Left) Complaining of left hip pain. Able to sit in chair  Activity level:  WBAT Diet tolerance:  as tolerated Voiding:  ok Patient reports pain as moderate.    Objective: Vital signs in last 24 hours: Temp:  [98 F (36.7 C)-98.2 F (36.8 C)] 98.2 F (36.8 C) (09/09 1350) Pulse Rate:  [82-94] 88 (09/09 1350) Resp:  [17-20] 20 (09/09 1350) BP: (127-166)/(50-60) 144/60 (09/09 1350) SpO2:  [90 %-97 %] 90 % (09/09 1350)  Labs: Recent Labs    10/18/22 0811 10/19/22 0458 10/20/22 0409  HGB 10.3* 10.0* 9.7*   Recent Labs    10/19/22 0458 10/20/22 0409  WBC 11.5* 9.0  RBC 4.08 3.92  HCT 34.7* 33.7*  PLT 193 203   Recent Labs    10/19/22 0458 10/20/22 1005  NA 141 140  K 4.6 4.2  CL 101 107  CO2 30 25  BUN 25* 16  CREATININE 1.20* 0.91  GLUCOSE 162* 167*  CALCIUM 9.3 8.8*   Recent Labs    10/19/22 0458 10/20/22 0409  INR 1.1 1.1    Physical Exam: Sitting in chair, Baseline mental status Non labored breathing on [place] LLE  Neurovascular intact Sensation intact distally Intact pulses distally Dorsiflexion/Plantar flexion intact Incision: dressing C/D/I  Assessment/Plan:  1 Day Post-Op Procedure(s) (LRB): LEFT INTRAMEDULLARY (IM) NAIL INTERTROCHANTERIC (Left) Discharge to SNF pending acceptance Pain control Has restarted home Coumadin for DVT ppx WBAT Rehab/PT/OT Appreciate medical management  Patient can follow up 2 weeks after discharge: [facility] [address] [place], [place] [address] [phone]    Luci Bank 10/20/2022, 4:58 PM

## 2022-10-20 NOTE — Progress Notes (Signed)
Internal Medicine Attending:   I was physically present during the key portions of the resident provided service and participated in medical decision making of the patient's management care in the assessment and plan.  Please see their progress note from today for complete details - I am unable to attest, will see about correcting this tomorrow.   On eval this morning patient was agitated, tremulous, and tachycardic. She takes norco at home and has been on this for many years. She refused her oral oxy this morning; last dose of dilaudid was yesterday evening per pharmacy; concerning for acute opioid withdrawal. Also chronically benzo dependent which we were holding. We have adjusted her pain control orders and resumed ativan. She is also getting a  high dose of oral prednisone for a COPD exacerbation which can cause agitationPlease page the resident team if not improving. Consider d/c'ing steroids if persistent agitation despite addressing medications.

## 2022-10-20 NOTE — Evaluation (Signed)
Occupational Therapy Evaluation Patient Details Name: [name] MRN: [id number] DOB: [birth year] Today's Date: 10/20/2022   History of Present Illness Pt is a 72 year old woman admitted on 10/17/22 after falling out of her kitchen chair, resulting in L hip fx. Underwent IM nail on 10/19/22. PMH: COPD on 2L home O2, pulmonary HTN, HTN, CHF, afib, DM2, anxiety, depression, fibromyalgia, arthritis, PVD, chronic back pain, renal cell carcinoma, sleppapnea, CVA, MVR.   Clinical Impression   Pt lives alone and mostly furniture walks. She is independent in self care and IADLs and use 2L O2 chronically. Pt presents with impaired cognition, anxiety, L hip pain and decreased standing balance. She requires up to light mod assist with RW OOB and set up to total assist for ADLs. Patient will benefit from continued inpatient follow up therapy, <3 hours/day.      If plan is discharge home, recommend the following: A lot of help with walking and/or transfers;A lot of help with bathing/dressing/bathroom;Assistance with cooking/housework;Assist for transportation;Help with stairs or ramp for entrance;Direct supervision/assist for financial management;Direct supervision/assist for medications management    Functional Status Assessment  Patient has had a recent decline in their functional status and demonstrates the ability to make significant improvements in function in a reasonable and predictable amount of time.  Equipment Recommendations  Other (comment) (defer to next venue)    Recommendations for Other Services       Precautions / Restrictions Precautions Precautions: Fall Precaution Comments: chronic 2L O2 Restrictions Weight Bearing Restrictions: Yes LLE Weight Bearing: Weight bearing as tolerated      Mobility Bed Mobility Overal bed mobility: Needs Assistance Bed Mobility: Supine to Sit     Supine to sit: Min assist, HOB elevated, Used rails     General bed mobility comments:  assist to support L LE, increased time    Transfers Overall transfer level: Needs assistance Equipment used: Rolling walker (2 wheels) Transfers: Sit to/from Stand, Bed to chair/wheelchair/BSC Sit to Stand: Min assist     Step pivot transfers: Mod assist (light mod)     General transfer comment: assist to rise and steady, cues for hand placement, pt needing reminders as to which hip was broken, but pain then reminded her      Balance Overall balance assessment: Needs assistance   Sitting balance-Leahy Scale: Fair     Standing balance support: Bilateral upper extremity supported Standing balance-Leahy Scale: Poor Standing balance comment: RW and up to light mod assist                           ADL either performed or assessed with clinical judgement   ADL Overall ADL's : Needs assistance/impaired Eating/Feeding: Independent   Grooming: Wash/dry hands;Sitting;Set up   Upper Body Bathing: Minimal assistance;Sitting   Lower Body Bathing: Total assistance;Sit to/from stand   Upper Body Dressing : Minimal assistance;Sitting   Lower Body Dressing: Total assistance;Sit to/from stand   Toilet Transfer: Minimal assistance;Stand-pivot;Rolling walker (2 wheels)   Toileting- Clothing Manipulation and Hygiene: Total assistance;Sit to/from stand               Vision Ability to See in Adequate Light: 0 Adequate Patient Visual Report: No change from baseline       Perception         Praxis         Pertinent Vitals/Pain Pain Assessment Pain Assessment: Faces Faces Pain Scale: Hurts even more Pain Location: L hip Pain  Descriptors / Indicators: Discomfort, Grimacing, Constant, Guarding Pain Intervention(s): Monitored during session, Repositioned     Extremity/Trunk Assessment Upper Extremity Assessment Upper Extremity Assessment: Overall WFL for tasks assessed (tremulous)   Lower Extremity Assessment Lower Extremity Assessment: Defer to PT  evaluation   Cervical / Trunk Assessment Cervical / Trunk Assessment: Kyphotic   Communication Communication Communication: No apparent difficulties   Cognition Arousal: Alert Behavior During Therapy: Anxious, Flat affect Overall Cognitive Status: Impaired/Different from baseline Area of Impairment: Memory, Attention, Following commands, Safety/judgement, Problem solving, Orientation                 Orientation Level: Time Current Attention Level: Focused Memory: Decreased short-term memory Following Commands: Follows one step commands with increased time Safety/Judgement: Decreased awareness of deficits, Decreased awareness of safety   Problem Solving: Slow processing, Decreased initiation, Difficulty sequencing, Requires verbal cues General Comments: pt needing assist to use her cell phone and to place order for breakfast     General Comments       Exercises     Shoulder Instructions      Home Living Family/patient expects to be discharged to:: Private residence Living Arrangements: Alone Available Help at Discharge: Family;Available PRN/intermittently Type of Home: House Home Access: Stairs to enter Entrance Stairs-Number of Steps: 1 Entrance Stairs-Rails: None Home Layout: One level     Bathroom Shower/Tub: Chief Strategy Officer: Standard     Home Equipment: Teacher, English as a foreign language (2 wheels);Cane - single point;Shower seat - built in;Grab bars - tub/shower;Grab bars - toilet;Hand held shower head          Prior Functioning/Environment Prior Level of Function : Independent/Modified Independent;History of Falls (last six months)             Mobility Comments: Pt reports mostly furniture walking ADLs Comments: Pt reports Ind        OT Problem List: Decreased activity tolerance;Decreased strength;Pain;Impaired balance (sitting and/or standing);Cardiopulmonary status limiting activity;Decreased cognition      OT  Treatment/Interventions: Self-care/ADL training;DME and/or AE instruction;Therapeutic activities;Cognitive remediation/compensation;Balance training;Patient/family education    OT Goals(Current goals can be found in the care plan section) Acute Rehab OT Goals OT Goal Formulation: With patient Time For Goal Achievement: 11/03/22 Potential to Achieve Goals: Good ADL Goals Pt Will Perform Grooming: with contact guard assist;standing Pt Will Perform Upper Body Dressing: with set-up;sitting Pt Will Perform Lower Body Dressing: with min assist;with adaptive equipment;sit to/from stand Pt Will Transfer to Toilet: with contact guard assist;bedside commode;ambulating Pt Will Perform Toileting - Clothing Manipulation and hygiene: with contact guard assist;sit to/from stand Additional ADL Goal #1: Pt will perform bed mobility modified indepedently in preparation for ADLs.  OT Frequency: Min 1X/week    Co-evaluation              AM-PAC OT "6 Clicks" Daily Activity     Outcome Measure Help from another person eating meals?: None Help from another person taking care of personal grooming?: A Little Help from another person toileting, which includes using toliet, bedpan, or urinal?: Total Help from another person bathing (including washing, rinsing, drying)?: A Lot Help from another person to put on and taking off regular upper body clothing?: A Little Help from another person to put on and taking off regular lower body clothing?: Total 6 Click Score: 14   End of Session Equipment Utilized During Treatment: Gait belt;Rolling walker (2 wheels);Oxygen (2L) Nurse Communication: Mobility status  Activity Tolerance: Patient tolerated treatment well Patient left: in chair;with call  bell/phone within reach;with chair alarm set  OT Visit Diagnosis: Unsteadiness on feet (R26.81);Other abnormalities of gait and mobility (R26.89);Pain;Muscle weakness (generalized) (M62.81);Other symptoms and signs  involving cognitive function Pain - Right/Left: Left Pain - part of body: Hip                Time: 5621-3086 OT Time Calculation (min): 33 min Charges:  OT General Charges $OT Visit: 1 Visit OT Evaluation $OT Eval Moderate Complexity: 1 Mod  Berna Spare, OTR/L Acute Rehabilitation Services Office: [phone]  Evern Bio 10/20/2022, 10:29 AM

## 2022-10-20 NOTE — [place] FL2 (Signed)
MEDICAID FL2 LEVEL OF CARE FORM     IDENTIFICATION  Patient Name: [name] Birthdate: 11-18-1950 Sex: female Admission Date (Current Location): 10/17/2022  [facility] and [place] Number:  Producer, television/film/video and Address:  [facility], [address] [place], [place] [address]      Provider Number: [id number]  Attending Physician Name and Address:  Reymundo Poll, MD  Relative Name and Phone Number:  Andree Moro [phone]  [phone]    Current Level of Care: Hospital Recommended Level of Care: Skilled Nursing Facility Prior Approval Number:    Date Approved/Denied:   PASRR Number: [id number]  Discharge Plan: SNF    Current Diagnoses: Patient Active Problem List   Diagnosis Date Noted   History of atrial fibrillation 10/18/2022   Fall 10/18/2022   Chronic anticoagulation 10/18/2022   Preop examination 10/18/2022   Chronic diastolic heart failure (HCC) 10/18/2022   Pulmonary hypertension, unspecified (HCC) 10/18/2022   Closed left hip fracture, initial encounter (HCC) 10/17/2022   Encephalopathy 10/17/2022   OSA (obstructive sleep apnea) 10/17/2022   Generalized pain 08/01/2022   Multifactorial functional impairment 02/13/2022   History of stroke without residual deficits 01/17/2022   Mild cognitive impairment with memory loss 12/23/2021   Diabetic peripheral neuropathy associated with type 2 diabetes mellitus (HCC) 11/11/2021   Recurrent falls 11/11/2021   Gait instability 09/19/2021   Perennial non-allergic rhinitis 09/19/2021   Chronic, continuous use of opioids for chronic pain 07/10/2021   Polypharmacy 11/08/2020   Personal history of renal cell carcinoma 09/12/2020   Tinea pedis of both feet 08/07/2020   Nocturnal hypoxia per sleep study 07/2019    Carotid artery stenosis; s/p R endarterectomy    COPD mixed type (HCC)    Arthritis of both hands 07/01/2019   Pain due to onychomycosis of  toenails of both feet 05/27/2019   Stage 2 chronic kidney disease due to type 2 diabetes mellitus (HCC) 12/16/2018   COPD exacerbation (HCC) 11/13/2017   Lichen sclerosus of female genitalia 01/12/2017   Mixed stress and urge urinary incontinence 05/27/2016   Pulmonary hypertension due to chronic obstructive pulmonary disease (HCC) 04/25/2016   Asymptomatic cholelithiasis 09/25/2015   Chronic iron deficiency anemia 12/26/2014   Aortic atherosclerosis (HCC) 10/19/2014   Right nephrolithiasis, asymptomatic (incidental finding) 09/06/2014   Pain of great toes medial nail beds of both feet 05/19/2014   Headache 01/14/2013   Chronic low back pain 10/06/2012   Long term current use of anticoagulant therapy 09/30/2012   Recurrent candidal intertrigo 07/28/2012   Tobacco use disorder 07/28/2012   Acute hypoxic respiratory failure (HCC) 05/04/2012   Personal history of colonic polyps 05/14/2011   Sleep disturbance 05/14/2011   Abdominal wall hernia 05/14/2011   Constipation due to opioid therapy 02/03/2011   Health care maintenance 11/04/2010   PAF (paroxysmal atrial fibrillation) (HCC) 10/22/2010   Chronic CHF (congestive heart failure) (HCC) 10/21/2010   Fibromyalgia 08/29/2010   Gastroesophageal reflux disease    Type 2 diabetes mellitus with other specified complication (HCC)    Osteoporosis    Peripheral arterial disease (HCC) 05/27/2010   History of mitral valve replacement with bioprosthetic valve 2012   Essential hypertension 06/19/2008   Hyperlipidemia 11/20/2005   Severe major depression (HCC) 11/19/2005    Orientation RESPIRATION BLADDER Height & Weight     Self, Time, Situation, Place  O2 External catheter, Continent Weight: 160 lb 15 oz (73 kg) Height:  5\' 2"  (157.5 cm)  BEHAVIORAL SYMPTOMS/MOOD  NEUROLOGICAL BOWEL NUTRITION STATUS      Continent Diet (see discharge summary)  AMBULATORY STATUS COMMUNICATION OF NEEDS Skin   Total Care Verbally Skin abrasions, Surgical  wounds                       Personal Care Assistance Level of Assistance  Bathing, Feeding, Dressing, Total care Bathing Assistance: Maximum assistance Feeding assistance: Independent Dressing Assistance: Maximum assistance Total Care Assistance: Maximum assistance   Functional Limitations Info  Sight, Hearing, Speech Sight Info: Adequate Hearing Info: Adequate Speech Info: Adequate    SPECIAL CARE FACTORS FREQUENCY  PT (By licensed PT), OT (By licensed OT)     PT Frequency: 5x week OT Frequency: 5x week            Contractures Contractures Info: Not present    Additional Factors Info  Code Status, Allergies, Insulin Sliding Scale Code Status Info: full Allergies Info: Dexilant (Dexlansoprazole), Lorazepam, Oxycontin (Oxycodone), Tramadol Hcl   Insulin Sliding Scale Info: Novolog: see discharge summary       Current Medications (10/20/2022):  This is the current hospital active medication list Current Facility-Administered Medications  Medication Dose Route Frequency Provider Last Rate Last Admin   acetaminophen (TYLENOL) tablet 1,000 mg  1,000 mg Oral Q6H Marrianne Mood, MD   1,000 mg at 10/20/22 0600   albuterol (PROVENTIL) (2.5 MG/3ML) 0.083% nebulizer solution 2.5 mg  2.5 mg Nebulization BID Dickie La, MD   2.5 mg at 10/19/22 2132   ALPRAZolam Prudy Feeler) tablet 0.5 mg  0.5 mg Oral Once PRN Marrianne Mood, MD       ALPRAZolam Prudy Feeler) tablet 0.5 mg  0.5 mg Oral BID Marrianne Mood, MD   0.5 mg at 10/20/22 1233   arformoterol (BROVANA) nebulizer solution 15 mcg  15 mcg Nebulization BID Marrianne Mood, MD   15 mcg at 10/19/22 2138   budesonide (PULMICORT) nebulizer solution 0.25 mg  0.25 mg Nebulization BID Marrianne Mood, MD   0.25 mg at 10/19/22 2142   buPROPion (WELLBUTRIN XL) 24 hr tablet 150 mg  150 mg Oral Metta Clines, Casimiro Needle, MD   150 mg at 10/20/22 0843   docusate sodium (COLACE) capsule 100 mg  100 mg Oral Daily Marrianne Mood, MD   100  mg at 10/20/22 0843   DULoxetine (CYMBALTA) DR capsule 60 mg  60 mg Oral Daily Marrianne Mood, MD   60 mg at 10/20/22 0843   enoxaparin (LOVENOX) injection 70 mg  70 mg Subcutaneous Q12H Marrianne Mood, MD   70 mg at 10/20/22 1233   gabapentin (NEURONTIN) capsule 600 mg  600 mg Oral QHS Dickie La, MD   600 mg at 10/19/22 2104   HYDROmorphone (DILAUDID) injection 0.5-1 mg  0.5-1 mg Intravenous Q3H PRN Marrianne Mood, MD   0.5 mg at 10/20/22 1122   insulin aspart (novoLOG) injection 0-15 Units  0-15 Units Subcutaneous TID WC Marrianne Mood, MD   3 Units at 10/20/22 1123   insulin glargine-yfgn (SEMGLEE) injection 20 Units  20 Units Subcutaneous Daily Hassan Rowan, [place], MD   20 Units at 10/20/22 1057   linaclotide (LINZESS) capsule 72 mcg  72 mcg Oral QAC breakfast Marrianne Mood, MD   72 mcg at 10/20/22 1233   metoprolol succinate (TOPROL-XL) 24 hr tablet 25 mg  25 mg Oral Daily Marrianne Mood, MD   25 mg at 10/20/22 0843   nicotine (NICODERM CQ - dosed in mg/24 hours) patch 14 mg  14 mg Transdermal Q0600 Marrianne Mood,  MD   14 mg at 10/20/22 0541   Oral care mouth rinse  15 mL Mouth Rinse PRN Marrianne Mood, MD       oxyCODONE (Oxy IR/ROXICODONE) immediate release tablet 5 mg  5 mg Oral Q4H Marrianne Mood, MD   5 mg at 10/20/22 1359   polyethylene glycol (MIRALAX / GLYCOLAX) packet 17 g  17 g Oral Daily PRN Marrianne Mood, MD   17 g at 10/20/22 1123   predniSONE (DELTASONE) tablet 40 mg  40 mg Oral Q breakfast Marrianne Mood, MD   40 mg at 10/20/22 0843   revefenacin (YUPELRI) nebulizer solution 175 mcg  175 mcg Nebulization Daily Marrianne Mood, MD   175 mcg at 10/18/22 0741   rosuvastatin (CRESTOR) tablet 20 mg  20 mg Oral Alisia Ferrari, MD   20 mg at 10/19/22 2104   Vitamin D (Ergocalciferol) (DRISDOL) 1.25 MG (50000 UNIT) capsule 50,000 Units  50,000 Units Oral Q7 days Marrianne Mood, MD   50,000 Units at 10/18/22 1300     Discharge  Medications: Please see discharge summary for a list of discharge medications.  Relevant Imaging Results:  Relevant Lab Results:   Additional Information ss#[id number]  Lorri [place], LCSW

## 2022-10-20 NOTE — Plan of Care (Signed)

## 2022-10-20 NOTE — Progress Notes (Signed)
Pt anxious, confused, and with tremors this AM. Pt declined oxycodone this AM and is concerned about side effects. Marrianne Mood, MD made aware and to bedside.

## 2022-10-21 ENCOUNTER — Telehealth: Payer: Self-pay | Admitting: Specialist

## 2022-10-21 ENCOUNTER — Ambulatory Visit (HOSPITAL_BASED_OUTPATIENT_CLINIC_OR_DEPARTMENT_OTHER): Payer: PPO | Admitting: Pulmonary Disease

## 2022-10-21 DIAGNOSIS — I4891 Unspecified atrial fibrillation: Secondary | ICD-10-CM | POA: Diagnosis not present

## 2022-10-21 DIAGNOSIS — F17210 Nicotine dependence, cigarettes, uncomplicated: Secondary | ICD-10-CM | POA: Diagnosis not present

## 2022-10-21 DIAGNOSIS — J441 Chronic obstructive pulmonary disease with (acute) exacerbation: Secondary | ICD-10-CM | POA: Diagnosis not present

## 2022-10-21 DIAGNOSIS — S72002A Fracture of unspecified part of neck of left femur, initial encounter for closed fracture: Secondary | ICD-10-CM | POA: Diagnosis not present

## 2022-10-21 LAB — HEPARIN LEVEL (UNFRACTIONATED): Heparin Unfractionated: 0.52 [IU]/mL (ref 0.30–0.70)

## 2022-10-21 LAB — PROTIME-INR
INR: 1.2 (ref 0.8–1.2)
Prothrombin Time: 15.2 s (ref 11.4–15.2)

## 2022-10-21 LAB — CBC
HCT: 31.8 % — ABNORMAL LOW (ref 36.0–46.0)
Hemoglobin: 9.3 g/dL — ABNORMAL LOW (ref 12.0–15.0)
MCH: 23.7 pg — ABNORMAL LOW (ref 26.0–34.0)
MCHC: 29.2 g/dL — ABNORMAL LOW (ref 30.0–36.0)
MCV: 80.9 fL (ref 80.0–100.0)
Platelets: 203 10*3/uL (ref 150–400)
RBC: 3.93 MIL/uL (ref 3.87–5.11)
RDW: 22.2 % — ABNORMAL HIGH (ref 11.5–15.5)
WBC: 8.1 10*3/uL (ref 4.0–10.5)
nRBC: 0 % (ref 0.0–0.2)

## 2022-10-21 LAB — GLUCOSE, CAPILLARY
Glucose-Capillary: 199 mg/dL — ABNORMAL HIGH (ref 70–99)
Glucose-Capillary: 337 mg/dL — ABNORMAL HIGH (ref 70–99)
Glucose-Capillary: 173 mg/dL — ABNORMAL HIGH (ref 70–99)
Glucose-Capillary: 303 mg/dL — ABNORMAL HIGH (ref 70–99)

## 2022-10-21 MED ORDER — POLYETHYLENE GLYCOL 3350 17 G PO PACK
17.0000 g | PACK | Freq: Every day | ORAL | Status: DC
  Administered 2022-10-21 – 2022-10-22 (×2): 17 g via ORAL
  Filled 2022-10-21 (×2): qty 1

## 2022-10-21 MED ORDER — INSULIN GLARGINE-YFGN 100 UNIT/ML ~~LOC~~ SOLN: 30.0000 [IU] | Freq: Once | SUBCUTANEOUS | Status: DC

## 2022-10-21 MED ORDER — HYDROCODONE-ACETAMINOPHEN 5-325 MG PO TABS
2.0000 | ORAL_TABLET | Freq: Four times a day (QID) | ORAL | Status: DC
  Administered 2022-10-21 – 2022-10-22 (×7): 2 via ORAL
  Filled 2022-10-21 (×7): qty 2

## 2022-10-21 MED ORDER — INSULIN GLARGINE-YFGN 100 UNIT/ML ~~LOC~~ SOLN
44.0000 [IU] | Freq: Every day | SUBCUTANEOUS | Status: DC
  Administered 2022-10-21 – 2022-10-22 (×2): 44 [IU] via SUBCUTANEOUS
  Filled 2022-10-21 (×3): qty 0.44

## 2022-10-21 MED ORDER — WARFARIN SODIUM 2.5 MG PO TABS
2.5000 mg | ORAL_TABLET | Freq: Once | ORAL | Status: AC
  Administered 2022-10-21: 2.5 mg via ORAL
  Filled 2022-10-21: qty 1

## 2022-10-21 MED ORDER — WARFARIN - PHARMACIST DOSING INPATIENT: Freq: Every day | Status: DC

## 2022-10-21 NOTE — Progress Notes (Signed)
RT at bedside this AM for morning breathing tx. Pt refused at this time. Per pt, "I have not eaten nor been to the bathroom yet. I will hold off now." Pt in no distress at this time, SVS and tolerating on room air.

## 2022-10-21 NOTE — Progress Notes (Signed)
.  Subjective: 2 Days Post-Op Procedure(s) (LRB): LEFT INTRAMEDULLARY (IM) NAIL INTERTROCHANTERIC (Left) Left hip pain unchanged. Able to sit in chair. Eager to go to SNF  Activity level:  WBAT Diet tolerance:  as tolerated Voiding:  ok Patient reports pain as moderate.     Objective: Vital signs in last 24 hours: Temp:  [98.2 F (36.8 C)-98.6 F (37 C)] 98.6 F (37 C) (09/10 0736) Pulse Rate:  [51-88] 86 (09/10 0736) Resp:  [18-20] 18 (09/10 0736) BP: (119-168)/(60-116) 168/69 (09/10 0736) SpO2:  [89 %-98 %] 94 % (09/10 0736) Weight:  [73 kg] 73 kg (09/10 0500)  Labs: Recent Labs    10/19/22 0458 10/20/22 0409 10/21/22 0414  HGB 10.0* 9.7* 9.3*   Recent Labs    10/20/22 0409 10/21/22 0414  WBC 9.0 8.1  RBC 3.92 3.93  HCT 33.7* 31.8*  PLT 203 203   Recent Labs    10/19/22 0458 10/20/22 1005  NA 141 140  K 4.6 4.2  CL 101 107  CO2 30 25  BUN 25* 16  CREATININE 1.20* 0.91  GLUCOSE 162* 167*  CALCIUM 9.3 8.8*   Recent Labs    10/20/22 0409 10/21/22 0414  INR 1.1 1.2    Physical Exam: Sitting in chair, Baseline mental status Non labored breathing on Standard LLE  Neurovascular intact Sensation intact distally Intact pulses distally Dorsiflexion/Plantar flexion intact Incision: dressing C/D/I  Assessment/Plan:  2 Days Post-Op Procedure(s) (LRB): LEFT INTRAMEDULLARY (IM) NAIL INTERTROCHANTERIC (Left) Discharge to SNF pending acceptance Pain control Currently on therapeutic Lvx, Ok to switch to Coumadin from ortho perspective WBAT Rehab/PT/OT Appreciate medical management   Patient can follow up 2 weeks after discharge for incision check and XR [facility] [address] [place], [place] [address] [phone]    Luci Bank 10/21/2022, 12:56 PM

## 2022-10-21 NOTE — Progress Notes (Signed)
ANTICOAGULATION CONSULT NOTE   Pharmacy Consult for wafarin Indication: atrial fibrillation with bioMVR  Allergies  Allergen Reactions   Dexilant [Dexlansoprazole] Other (See Comments)    Made acid reflux worse   Lorazepam Other (See Comments)    Patient's sister noted that ativan caused the patient to become extremely confused during hospitalization 09/2010; tolerates Xanax   Oxycontin [Oxycodone] Other (See Comments)    headache   Tramadol Hcl Swelling    Ankle swelling    Patient Measurements: Height: 5\' 2"  (157.5 cm) Weight: 73 kg (160 lb 15 oz) IBW/kg (Calculated) : 50.1   Vital Signs: Temp: 98.6 F (37 C) (09/10 0736) Temp Source: Oral (09/10 0736) BP: 168/69 (09/10 0736) Pulse Rate: 86 (09/10 0736)  Labs: Recent Labs    10/19/22 0458 10/20/22 0409 10/20/22 1005 10/21/22 0414  HGB 10.0* 9.7*  --  9.3*  HCT 34.7* 33.7*  --  31.8*  PLT 193 203  --  203  LABPROT 14.5 14.9  --  15.2  INR 1.1 1.1  --  1.2  HEPARINUNFRC 0.26* 0.13*  --  0.52  CREATININE 1.20*  --  0.91  --     Estimated Creatinine Clearance: 51.5 mL/min (by C-G formula based on SCr of 0.91 mg/dL).   Medical History: Past Medical History:  Diagnosis Date   Abnormality of lung on CXR 02/02/2020   Nonspecific finding on CXR ordered by pulmonologist - c/w inflammation vs infection, f/u imaging suggested, Dr. Roxy Cedar office has been in communication about recommended next steps.   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   Non-bleeding when visualized on capsule endoscopy 06/30/2015    Arthritis    "lower back; hands" (02/19/2018)   Asthma    Asymptomatic cholelithiasis 09/25/2015   Seen on CT scan 08/2015   Carotid artery stenosis; s/p R endarterectomy    s/p right endarterectomy (06/2010) Carotid US (07/2010):  Left: Moderate-to-severe (60-79%) calcific and  non-calcific plaque origin and proximal ICA and ECA    Chronic congestive heart failure with left ventricular diastolic dysfunction (HCC) 10/21/2010   Chronic constipation 02/03/2011   Chronic daily headache 01/16/2014   Chronic iron deficiency anemia    Chronic low back pain 10/06/2012   Chronic venous insufficiency 08/04/2012   Closed fracture of one rib of left side 08/23/2021   ED visit after a fall 08/16/21    COPD (chronic obstructive pulmonary disease) with emphysema (HCC)    PFTs 2018: severe obstructive disease, insignif response to bronchiodilator, mild restriction parenchymal pattern, moderately severe diffusion defect. 2014  FEV1 0.92 (40%), ratio 69, 27% increase in FEV1 with BD, TLC 91%, severe airtrapping, DLCO49% On chronic home O2. Pulmonary rehab referral 05/2012    Dyspnea    Fibromyalgia 08/29/2010   Gastroesophageal reflux disease    History of blood transfusion    "several times"  (02/19/2018)   History of clear cell renal cell carcinoma (HCC), in remission 07/21/2011   s/p cryoablation of left RCC in 09/2011 by Dr. Fredia Sorrow. Followed by Dr. Retta Diones  [facility]) .     History of hiatal hernia    History of mitral valve replacement with bioprosthetic valve due to mitral stenosis 2012   s/p MVR with a 27-mm pericardial porcine valve (Medtronic Mosaic valve, serial #[id number] on 09/20/10, Dr. Donata Clay)    History of obstructive sleep apnea, resolved 2013   resolved per sleep study 07/2019;  no apnea, but did have desaturation.  CPAP no longer necessary.  Nocturnal polysomnography (06/2009): Moderate sleep apnea/ hypopnea syndrome , AHI 17.8 per hour with nonpositional hypopneas. CPAP titration to 12 CWP, AHI 2.4 per hour. On nocturnal CPAP via a small resMed Quattro full-face mask with heated humidifier.    History of pneumonia    "once"  (02/19/2018)   History of seborrheic keratosis 09/28/2015   History of stroke without residual deficits 01/17/2022   Hyperlipidemia  LDL goal < 100 11/20/2005   Internal and external hemorrhoids without complication 08/04/2012   Lesion of left native kidney 06/01/2020   Incidental finding on recent screening chest CT for lung ca ordered by Dr. Maple Hudson pulmonology - he has ordered a f/u renal U/S.    Lichen sclerosus of female genitalia 01/12/2017   Migraine    "none in years" (02/19/2018)   Mild cognitive impairment with memory loss 12/23/2021   Moderately severe major depression (HCC) 11/19/2005   Nocturnal hypoxia per sleep study 07/2019    Osteoporosis    DEXA 2016: T -2.7; DEXA (12/09/2011): L-spine T -3.7, left hip T -1.4 DEXA (12/2004): L-spine T -2.6, left hip -0.1    Paroxysmal atrial fibrillation (HCC) 10/22/2010   s/p Left atrial maze procedure for paroxysmal atrial fibrillation on 09/20/2010 by Dr Donata Clay.  Subsequent splenic infarct, decision was made to re-anticoagulate with coumadin, likely life-long as this is the most likely cause of the splenic infarct.    Personal history of colonic polyps 05/14/2011   Colonoscopy (05/2011): 4 mm adenomatous polyp excised endoscopically Colonoscopy (02/2002): Adenomatous polyp excised endoscopically    Personal history of renal cell carcinoma 09/12/2020   Pneumonia    Pulmonary hypertension due to chronic obstructive pulmonary disease (HCC) 04/25/2016   2014 TEE w PA peak pressure 46 mmHg, s/p MV replacement    Right nephrolithiasis, asymptomatic, incidental finding 09/06/2014   5 mm non-obstructing calculus seen on CT scan 09/05/2014    Right ventricular failure (HCC) 04/25/2016   Severe obesity (BMI 35.0-39.9) with comorbidity (HCC) 10/23/2011   Sleep apnea    Stage 2 chronic kidney disease due to type 2 diabetes mellitus (HCC) 12/16/2018   CKD stage III     Tobacco abuse 07/28/2012   Type 2 diabetes mellitus with diabetic neuropathy [facility])    Assessment: 72 yo W on warfarin PTA for valvular afib and hx bio MVR. Warfarin was held for [facility] 9/8. Patient received  therapeutic enoxaparin 9/9. Ok to restart warfarin 9/10.   INR 1.2. CBC stable. Eating 75% of meals.   Warfarin PTA 2.5mg  Tue/Fri, 1.25,g all other days.  Goal of Therapy:  INR 2-3 Monitor platelets by anticoagulation protocol: Yes   Plan:  Warfarin 2.5mg  x1 Monitor daily INR, CBC, signs/symptoms of bleeding   Alphia Moh, PharmD, BCPS, BCCP Clinical Pharmacist  Please check AMION for all [facility] Pharmacy phone numbers After 10:00 PM, call Main Pharmacy [phone]

## 2022-10-21 NOTE — Progress Notes (Signed)
Mobility Specialist: Progress Note   10/21/22 1551  Mobility  Activity Ambulated with assistance in room  Level of Assistance Minimal assist, patient does 75% or more  Assistive Device Front wheel walker  Distance Ambulated (ft) 15 ft  LLE Weight Bearing WBAT  Activity Response Tolerated well  Mobility Referral Yes  $Mobility charge 1 Mobility  Mobility Specialist Start Time (ACUTE ONLY) 1452  Mobility Specialist Stop Time (ACUTE ONLY) 1531  Mobility Specialist Time Calculation (min) (ACUTE ONLY) 39 min    Pre-Mobility: SpO2 94% RA During Mobility: SpO2 89-91% RA Post-Mobility: SpO2 94-95% RA  Pt requested to move back to bed - received in chair. Stated LLE pain was 6/10 while at rest. Required minA throughout mobility d/t LLE pain and weakness. Had c/o increased pain in LLE rated 8/10, SOB, fatigue, and dizziness. VSS throughout. Requested to use [facility]; completed void and MS assisted with peri care. Returned to bed without fault. Left in bed with all needs met, call bell in reach. Bed alarm on.   Maurene Capes Mobility Specialist Please contact via SecureChat or Rehab office at [phone]

## 2022-10-21 NOTE — Progress Notes (Signed)
Physical Therapy Treatment Patient Details Name: [name] MRN: [id number] DOB: Feb 03, 1951 Today's Date: 10/21/2022   History of Present Illness Pt is a 72 year old woman admitted on 10/17/22 after falling out of her kitchen chair, resulting in L hip fx. Underwent IM nail on 10/19/22. PMH: COPD on 2L home O2, pulmonary HTN, HTN, CHF, afib, DM2, anxiety, depression, fibromyalgia, arthritis, PVD, chronic back pain, renal cell carcinoma, sleppapnea, CVA, MVR.    PT Comments  Continuing work on functional mobility and activity tolerance;  Session focused on functional transfers and initial gait training, with good progress noted; Needed Mod assist for bed mobility today (mostly related to support as she moved her RUE from an awkward position holding the bedrail); Very nice sit to stand, painful, but she is able to shift weight onto less painful RLE; Able to walk 6 feet in room, and notable weight acceptance on LLE; Overall progressing well; Anticipate continuing good progress at post-acute rehabilitation.    If plan is discharge home, recommend the following: A lot of help with walking and/or transfers;A little help with bathing/dressing/bathroom;Assistance with cooking/housework;Assist for transportation;Help with stairs or ramp for entrance   Can travel by private vehicle     No  Equipment Recommendations  Other (comment) (seems pretty well-equipped)    Recommendations for Other Services       Precautions / Restrictions Precautions Precautions: Fall Precaution Comments: chronic 2L O2 Restrictions Weight Bearing Restrictions: Yes LLE Weight Bearing: Weight bearing as tolerated     Mobility  Bed Mobility Overal bed mobility: Needs Assistance Bed Mobility: Supine to Sit     Supine to sit: Mod assist     General bed mobility comments: Good use of bedrails to pull self up, however got to a position where if she let go, she would fall back; mod assist to support pt's trunk as  she moved R hand from bedrail to beside her    Transfers Overall transfer level: Needs assistance Equipment used: Rolling walker (2 wheels) Transfers: Sit to/from Stand Sit to Stand: Min assist           General transfer comment: Min assist to steady    Ambulation/Gait Ambulation/Gait assistance: Mod assist, +2 safety/equipment (chair follow) Gait Distance (Feet): 6 Feet (towards window with youth-sized RW) Assistive device: Rolling walker (2 wheels) Gait Pattern/deviations: Decreased step length - right, Decreased stance time - left, Antalgic       General Gait Details: Cues fo rsequence and to use UEs to push down into RW to unweigh painful L LE in stance   Stairs             Wheelchair Mobility     Tilt Bed    Modified Rankin (Stroke Patients Only)       Balance                                            Cognition Arousal: Alert Behavior During Therapy: Anxious, Flat affect Overall Cognitive Status: Impaired/Different from baseline Area of Impairment: Memory, Attention, Following commands, Safety/judgement, Problem solving, Orientation                 Orientation Level: Time Current Attention Level: Focused Memory: Decreased short-term memory Following Commands: Follows one step commands with increased time Safety/Judgement: Decreased awareness of deficits, Decreased awareness of safety   Problem Solving: Slow processing, Decreased initiation,  Difficulty sequencing, Requires verbal cues General Comments: pt needing assist to use her cell phone and to place order for breakfast        Exercises      General Comments General comments (skin integrity, edema, etc.): Session conducted on room air, and O2 sats decr to 87% observed lowest      Pertinent Vitals/Pain Pain Assessment Pain Assessment: Faces Faces Pain Scale: Hurts even more Pain Location: L hip Pain Descriptors / Indicators: Discomfort, Grimacing, Constant,  Guarding Pain Intervention(s): Limited activity within patient's tolerance    Home Living                          Prior Function            PT Goals (current goals can now be found in the care plan section) Acute Rehab PT Goals Patient Stated Goal: Did not specifically state, but agreaable to OOB PT Goal Formulation: With patient Time For Goal Achievement: 11/03/22 Potential to Achieve Goals: Fair Progress towards PT goals: Progressing toward goals    Frequency    Min 1X/week      PT Plan      Co-evaluation              AM-PAC PT "6 Clicks" Mobility   Outcome Measure  Help needed turning from your back to your side while in a flat bed without using bedrails?: A Lot Help needed moving from lying on your back to sitting on the side of a flat bed without using bedrails?: A Lot Help needed moving to and from a bed to a chair (including a wheelchair)?: A Little Help needed standing up from a chair using your arms (e.g., wheelchair or bedside chair)?: A Little     6 Click Score: 10    End of Session Equipment Utilized During Treatment: Gait belt Activity Tolerance: Patient tolerated treatment well (though painful) Patient left: in chair;with call bell/phone within reach (placed geomat air cushion in recliner) Nurse Communication: Mobility status;Other (comment) (Take O2 off as needed) PT Visit Diagnosis: Unsteadiness on feet (R26.81);Other abnormalities of gait and mobility (R26.89);History of falling (Z91.81);Pain Pain - Right/Left: Left Pain - part of body: Hip     Time: 1012-1032 PT Time Calculation (min) (ACUTE ONLY): 20 min  Charges:    $Gait Training: 8-22 mins PT General Charges $$ ACUTE PT VISIT: 1 Visit                     Van Clines, PT  Acute Rehabilitation Services Office [phone] Secure Chat welcomed    Levi Aland 10/21/2022, 1:38 PM

## 2022-10-21 NOTE — Consult Note (Signed)
   Value-Based Care Institute  [facility] Inpatient Consult   10/21/2022  [name] [birth year] [id number]  [facility] [[facility]]  Accountable Care Organization [ACO] Patient: [employer]   Primary Care Provider:  Miguel Aschoff, MD with [facility] Internal Medicine  Patient is currently active with [facility] [[facility]] Care Management for care coordination services.  Patient has been engaged by a Dealer.  The community based plan of care has focused on community resource support.   Patient was reviewed for less than 30 days readmission due to a fall with left hip fracture. Patient is from home alone.  Met with patient and sister at bedside, patient up in recliner.  Explained reason for rounding visit.  Patient endorses PCP and Community LCSW for post hospital/facility follow up.  Patient states she likely to put the work on hold for getting her house repaired until she knows what her post rehab entails. She is hopeful for [facility]. Patient expressing concerns for COVID outbreaks and when she will have a vaccine available. Encouraged her to speak with PCP and she says she may call her pharmacy. No other needs expressed.  Plan: Currently, patient is being recommended for ST rehab SNF and needs for insurance authorization expected.    Of note, [facility] Care Management services does not replace or interfere with any services that are needed or arranged by inpatient [facility] care management team.   Charlesetta Shanks, RN, BSN, CCM [place]  [facility], [facility] Liaison Direct Dial: [phone] or secure chat Website: [name]

## 2022-10-21 NOTE — Progress Notes (Signed)
   10/21/22 0000  BiPAP/CPAP/SIPAP  $ Non-Invasive Home Ventilator  Subsequent  BiPAP/CPAP/SIPAP Pt Type Adult  BiPAP/CPAP/SIPAP Resmed  Mask Type Nasal mask  IPAP  (Alex (15-5))  EPAP 5 cmH2O  Flow Rate 2 lpm  Patient Home Equipment Yes  Auto Titrate Yes  Safety Check Completed by RT for Home Unit Yes, no issues noted

## 2022-10-21 NOTE — Telephone Encounter (Signed)
I spoke with patient this date regarding her participation in [facility].  She has fallen and fractured her hip and will be going to a SNF for short term rehab.  She requested that we place all Aging Gracefully work on hold. I will follow up with [facility] care partners. [name], MHA, OT/L [phone]

## 2022-10-21 NOTE — TOC Progression Note (Signed)
Transition of Care [facility]) - Progression Note    Patient Details  Name: [name] MRN: [id number] Date of Birth: 1950-06-29  Transition of Care [facility]) CM/SW Contact  Lorri Frederick, LCSW Phone Number: 10/21/2022, 10:02 AM  Clinical Narrative:   No decision from [facility] yet, they do have several covid cases in the building.  [facility] does offer bed, they have one covid case currently.  Pt listed as oriented x2, CSW spoke with sister Meriam Sprague, discussed options.  She would be OK with either [facility] or [facility].  CSW provided bed offers to pt and she wants to accept [facility] offer.  Sister informed.  CSW confirmed with [facility].  Auth request made to HTA/Tammie.     Expected Discharge Plan: Skilled Nursing Facility Barriers to Discharge: Continued Medical Work up, SNF Pending bed offer  Expected Discharge Plan and Services In-house Referral: Clinical Social Work   Post Acute Care Choice: Skilled Nursing Facility Living arrangements for the past 2 months: Single Family Home                                       Social Determinants of Health (SDOH) Interventions SDOH Screenings   Food Insecurity: Food Insecurity Present (10/17/2022)  Housing: Patient Declined (10/17/2022)  Transportation Needs: Unmet Transportation Needs (10/17/2022)  Utilities: Not At Risk (10/17/2022)  Alcohol Screen: Low Risk  (05/01/2022)  Depression (PHQ2-9): High Risk (07/01/2022)  Financial Resource Strain: Low Risk  (05/01/2022)  Physical Activity: Inactive (05/01/2022)  Social Connections: Socially Isolated (05/01/2022)  Stress: Stress Concern Present (05/01/2022)  Tobacco Use: High Risk (10/19/2022)    Readmission Risk Interventions     No data to display

## 2022-10-21 NOTE — Progress Notes (Addendum)
HD#4 SUBJECTIVE:   Overnight Events: No ON events.   Interim History: Is upset because she needs to go to the bathroom and needs assistance. She is in pain has not been able to get meds. She states someone came into her room last night and cut the tubes of her CPAP. Has not had a BM since surgery.   OBJECTIVE:  Vital Signs: Vitals:   10/21/22 0439 10/21/22 0500 10/21/22 0736 10/21/22 1313  BP: (!) 164/68  (!) 168/69 (!) 149/55  Pulse: 77  86 66  Resp:   18 20  Temp: 98.5 F (36.9 C)  98.6 F (37 C) 98.5 F (36.9 C)  TempSrc: Oral  Oral Oral  SpO2: 94%  94% 92%  Weight:  73 kg    Height:       Supplemental O2: Room Air SpO2: 92 % O2 Flow Rate (L/min): 0 L/min  Filed Weights   10/17/22 1141 10/17/22 1800 10/21/22 0500  Weight: 70.8 kg 73 kg 73 kg    Intake/Output Summary (Last 24 hours) at 10/21/2022 1339 Last data filed at 10/21/2022 1100 Gross per 24 hour  Intake 240 ml  Output 1100 ml  Net -860 ml   Net IO Since Admission: -550.48 mL [10/21/22 1339]  Physical Exam: Physical Exam Vitals reviewed.  Constitutional:      Appearance: She is not ill-appearing.  Cardiovascular:     Rate and Rhythm: Normal rate and regular rhythm.     Heart sounds: S1 normal and S2 normal. No murmur heard.    No gallop.  Pulmonary:     Effort: No tachypnea or respiratory distress.     Breath sounds: Examination of the left-upper field reveals wheezing. Wheezing present. No decreased breath sounds, rhonchi or rales.  Abdominal:     General: Abdomen is protuberant. Bowel sounds are increased. There is no distension.     Tenderness: There is abdominal tenderness in the epigastric area. There is no guarding or rebound.  Musculoskeletal:     Cervical back: No rigidity.     Right lower leg: No edema.     Left lower leg: No edema.  Skin:    Capillary Refill: Capillary refill takes less than 2 seconds.     Coloration: Skin is pale.  Neurological:     General: No focal deficit  present.    Patient Lines/Drains/Airways Status     Active Line/Drains/Airways     Name Placement date Placement time Site Days   Peripheral IV 10/17/22 20 G Anterior;Left Forearm 10/17/22  1149  Forearm  4   Peripheral IV 10/17/22 22 G Left;Posterior Hand 10/17/22  --  Hand  4   External Urinary Catheter 10/18/22  1525  --  3   Incision (Closed) 10/19/22 Hip Left 10/19/22  0716  -- 2   Wound / Incision (Open or Dehisced) 09/25/22 Irritant Dermatitis (Moisture Associated Skin Damage) Groin Bilateral red 09/25/22  1334  Groin  26   Wound / Incision (Open or Dehisced) 09/25/22 Irritant Dermatitis (Moisture Associated Skin Damage) Abdomen Lower;Medial;Mid red 09/25/22  1334  Abdomen  26             ASSESSMENT/PLAN:  Assessment: Principal Problem:   Closed left hip fracture, initial encounter (HCC) Active Problems:   Severe major depression (HCC)   Peripheral arterial disease (HCC)   Type 2 diabetes mellitus with other specified complication (HCC)   Chronic CHF (congestive heart failure) (HCC)   PAF (paroxysmal atrial fibrillation) (HCC)   Acute  hypoxic respiratory failure (HCC)   Tobacco use disorder   Pulmonary hypertension due to chronic obstructive pulmonary disease (HCC)   COPD exacerbation (HCC)   History of mitral valve replacement with bioprosthetic valve   Encephalopathy   OSA (obstructive sleep apnea)   History of atrial fibrillation   Fall   Chronic anticoagulation   Preop examination   Chronic diastolic heart failure (HCC)   Pulmonary hypertension, unspecified (HCC)   Plan:  Closed left intertrochanteric hip fracture s/p intramedullary nail fixation  Would like home hydrocodone for her pain rather than oxy.  -Start hydrocodone-acetaminophen 5-325mg  x 2 q6h  -Dilaudid 0.5 to 1 mg IV every 3 hours as needed for severe pain or breakthrough pain -Vitamin D level 17.54 Will start Vitamin D supplementation at 1000 international unit daily, encourage PO Ca intake  and alendronate at discharge.  -Will need follow up with Ortho in 14 days for incision check and XR     Atrial fibrillation Status post MVR with bioprosthetic mitral valve History of embolism 2/2 heart procedure  Warfarin was held for surgery s/p Vitamin K. INR 2.3 at admission decreased to  1.5. Bridged with heparin before her IMN 9/8.  Currently on Enoxaparin for VTE ppx. CHA2DS2VASC score is 8 and is high risk. Stroke risk is 10.8% per year in >90,000 patients (the [place] Atrial Fibrillation Cohort Study) and 15.2% risk of stroke/TIA/systemic embolism.   - Continue enoxaparin  - Bridge with warfarin today, appreciate pharmacy assistance with this. - Continue metoprolol XL 25 mg daily    COPD with exacerbation Improving, stable today. Continue bronchodilators and steroids. - Budesonide, formoterol, revefenacin nebulizer - Albuterol via neb twice daily - Prednisone 40 mg x 5 days (today is day 4) - Finished Azithromycin.   Chronic congestive heart failure Well compensated at present.  Currently holding home Lasix as not volume overloaded. Had elevated Cr on presentation. Likely due to reduced PO intake at admission. -K>4 and Mg>2 -daily weights -I/Os -med telemetry   Elevated Cr - resolved Baseline is ~0.98-1.0  Trending down and at baseline today.   Diabetes on insulin Glucose well-controlled at present. Tresiba 44 units daily at home. Was NPO yesterday.  -Had 20 units yesterday due to being NPO. -Restarting home dose of 44 units today - Continue SSI as she is on prednisone   Encephalopathy Fall She was mildly encephalopathic during her admission that improved throughout. However, today she was thinking that people were coming into her room to cut the tubes from her CPAP. She is on prednisone. Neurologically intact. Was also on oxycodone, which would give her a headache. Patient is afebrile, no leukocytosis, not hypotensive, not worsening SOB.  -continue to monitor    Constipation  -Continue home Linzess 72mg  daily  -Continue with Miralax daily (now scheduled) -Continue with colace daily (now scheduled)  OSA  Chronic and stable. Cw with CPAP with bleed O2. Cw same.     Severe major depression Anxiety Continue home medications. - Duloxetine, bupropion - 0.5 to 1 mg Xanax as needed at bedtime   Peripheral arterial disease Lower extremities bilaterally with decent perfusion on exam today. - Continue rosuvastatin   Tobacco use disorder Seems precontemplative regarding quitting. - Nicotine replacement   Best Practice: Diet: Regular diet VTE: enoxaparin (LOVENOX) injection 40 mg Start: 10/19/22 2000  SCDs Start: 10/17/22 1637 Code: FULL   Signature: Janiel Crisostomo Alexander-Savino,MD  Internal Medicine Resident, PGY-1 [facility]  Pager: [phone] 1:39 PM, 10/21/2022   Please contact  the on call pager after 5 pm and on weekends at [phone].

## 2022-10-21 NOTE — Inpatient Diabetes Management (Signed)
Inpatient Diabetes Program Recommendations  AACE/ADA: New Consensus Statement on Inpatient Glycemic Control (2015)  Target Ranges:  Prepandial:   less than 140 mg/dL      Peak postprandial:   less than 180 mg/dL (1-2 hours)      Critically ill patients:  140 - 180 mg/dL   Lab Results  Component Value Date   GLUCAP 337 (H) 10/21/2022   HGBA1C 7.4 (A) 07/24/2022    Review of Glycemic Control  Latest Reference Range & Units 10/20/22 07:27 10/20/22 11:14 10/20/22 15:50 10/20/22 21:03 10/21/22 07:35 10/21/22 11:07  Glucose-Capillary 70 - 99 mg/dL 086 (H) 578 (H) 469 (H) 231 (H) 199 (H) 337 (H)   Diabetes history: DM 2 Outpatient Diabetes medications: FSL3, Tresiba 44 units Daily, Ozempic 1 mg weekly Qmonday Current orders for Inpatient glycemic control:  Novolog 0-15 units tid  A1c 7.4% on 6/13 PO prednisone 40 mg Daily  Inpatient Diabetes Program Recommendations:    Due to steroid dose postprandial glucose trends increase.  -   Consider adding Novolog 3 units tid meal coverage if eating >50% of meals  Thanks,  Christena Deem RN, MSN, BC-ADM Inpatient Diabetes Coordinator Team Pager [phone] (8a-5p)

## 2022-10-21 NOTE — Plan of Care (Signed)

## 2022-10-22 ENCOUNTER — Ambulatory Visit: Payer: Self-pay | Admitting: *Deleted

## 2022-10-22 ENCOUNTER — Telehealth: Payer: Self-pay

## 2022-10-22 DIAGNOSIS — M6281 Muscle weakness (generalized): Secondary | ICD-10-CM | POA: Diagnosis not present

## 2022-10-22 DIAGNOSIS — D509 Iron deficiency anemia, unspecified: Secondary | ICD-10-CM | POA: Diagnosis not present

## 2022-10-22 DIAGNOSIS — Z7901 Long term (current) use of anticoagulants: Secondary | ICD-10-CM | POA: Diagnosis not present

## 2022-10-22 DIAGNOSIS — R531 Weakness: Secondary | ICD-10-CM | POA: Diagnosis not present

## 2022-10-22 DIAGNOSIS — S72002D Fracture of unspecified part of neck of left femur, subsequent encounter for closed fracture with routine healing: Secondary | ICD-10-CM | POA: Diagnosis not present

## 2022-10-22 DIAGNOSIS — S52511A Displaced fracture of right radial styloid process, initial encounter for closed fracture: Secondary | ICD-10-CM | POA: Diagnosis not present

## 2022-10-22 DIAGNOSIS — E785 Hyperlipidemia, unspecified: Secondary | ICD-10-CM | POA: Diagnosis not present

## 2022-10-22 DIAGNOSIS — I272 Pulmonary hypertension, unspecified: Secondary | ICD-10-CM | POA: Diagnosis not present

## 2022-10-22 DIAGNOSIS — G3184 Mild cognitive impairment, so stated: Secondary | ICD-10-CM | POA: Diagnosis not present

## 2022-10-22 DIAGNOSIS — J9601 Acute respiratory failure with hypoxia: Secondary | ICD-10-CM | POA: Diagnosis not present

## 2022-10-22 DIAGNOSIS — G9340 Encephalopathy, unspecified: Secondary | ICD-10-CM | POA: Diagnosis not present

## 2022-10-22 DIAGNOSIS — E1169 Type 2 diabetes mellitus with other specified complication: Secondary | ICD-10-CM | POA: Diagnosis not present

## 2022-10-22 DIAGNOSIS — M810 Age-related osteoporosis without current pathological fracture: Secondary | ICD-10-CM | POA: Diagnosis not present

## 2022-10-22 DIAGNOSIS — S72002A Fracture of unspecified part of neck of left femur, initial encounter for closed fracture: Secondary | ICD-10-CM | POA: Diagnosis not present

## 2022-10-22 DIAGNOSIS — I480 Paroxysmal atrial fibrillation: Secondary | ICD-10-CM | POA: Diagnosis not present

## 2022-10-22 DIAGNOSIS — G4733 Obstructive sleep apnea (adult) (pediatric): Secondary | ICD-10-CM | POA: Diagnosis not present

## 2022-10-22 DIAGNOSIS — I509 Heart failure, unspecified: Secondary | ICD-10-CM | POA: Diagnosis not present

## 2022-10-22 DIAGNOSIS — M19031 Primary osteoarthritis, right wrist: Secondary | ICD-10-CM | POA: Diagnosis not present

## 2022-10-22 DIAGNOSIS — S52611A Displaced fracture of right ulna styloid process, initial encounter for closed fracture: Secondary | ICD-10-CM | POA: Diagnosis not present

## 2022-10-22 DIAGNOSIS — I4891 Unspecified atrial fibrillation: Secondary | ICD-10-CM | POA: Diagnosis not present

## 2022-10-22 DIAGNOSIS — S72142D Displaced intertrochanteric fracture of left femur, subsequent encounter for closed fracture with routine healing: Secondary | ICD-10-CM | POA: Diagnosis not present

## 2022-10-22 DIAGNOSIS — Z743 Need for continuous supervision: Secondary | ICD-10-CM | POA: Diagnosis not present

## 2022-10-22 DIAGNOSIS — K219 Gastro-esophageal reflux disease without esophagitis: Secondary | ICD-10-CM | POA: Diagnosis not present

## 2022-10-22 DIAGNOSIS — S72002S Fracture of unspecified part of neck of left femur, sequela: Secondary | ICD-10-CM | POA: Diagnosis not present

## 2022-10-22 DIAGNOSIS — E1122 Type 2 diabetes mellitus with diabetic chronic kidney disease: Secondary | ICD-10-CM | POA: Diagnosis not present

## 2022-10-22 DIAGNOSIS — I5089 Other heart failure: Secondary | ICD-10-CM | POA: Diagnosis not present

## 2022-10-22 DIAGNOSIS — J449 Chronic obstructive pulmonary disease, unspecified: Secondary | ICD-10-CM | POA: Diagnosis not present

## 2022-10-22 DIAGNOSIS — J441 Chronic obstructive pulmonary disease with (acute) exacerbation: Secondary | ICD-10-CM | POA: Diagnosis not present

## 2022-10-22 DIAGNOSIS — Z8679 Personal history of other diseases of the circulatory system: Secondary | ICD-10-CM | POA: Diagnosis not present

## 2022-10-22 DIAGNOSIS — I10 Essential (primary) hypertension: Secondary | ICD-10-CM | POA: Diagnosis not present

## 2022-10-22 DIAGNOSIS — R2689 Other abnormalities of gait and mobility: Secondary | ICD-10-CM | POA: Diagnosis not present

## 2022-10-22 DIAGNOSIS — F17210 Nicotine dependence, cigarettes, uncomplicated: Secondary | ICD-10-CM | POA: Diagnosis not present

## 2022-10-22 LAB — BASIC METABOLIC PANEL
Anion gap: 8 (ref 5–15)
BUN: 20 mg/dL (ref 8–23)
CO2: 28 mmol/L (ref 22–32)
Calcium: 8.9 mg/dL (ref 8.9–10.3)
Chloride: 101 mmol/L (ref 98–111)
Creatinine, Ser: 0.84 mg/dL (ref 0.44–1.00)
GFR, Estimated: >60 mL/min (ref >60)
Glucose, Bld: 189 mg/dL — ABNORMAL HIGH (ref 70–99)
Potassium: 4.3 mmol/L (ref 3.5–5.1)
Sodium: 137 mmol/L (ref 135–145)

## 2022-10-22 LAB — CBC
HCT: 28.9 % — ABNORMAL LOW (ref 36.0–46.0)
Hemoglobin: 8.6 g/dL — ABNORMAL LOW (ref 12.0–15.0)
MCH: 24.2 pg — ABNORMAL LOW (ref 26.0–34.0)
MCHC: 29.8 g/dL — ABNORMAL LOW (ref 30.0–36.0)
MCV: 81.4 fL (ref 80.0–100.0)
Platelets: 194 10*3/uL (ref 150–400)
RBC: 3.55 MIL/uL — ABNORMAL LOW (ref 3.87–5.11)
RDW: 22.1 % — ABNORMAL HIGH (ref 11.5–15.5)
WBC: 7.1 10*3/uL (ref 4.0–10.5)
nRBC: 0 % (ref 0.0–0.2)

## 2022-10-22 LAB — GLUCOSE, CAPILLARY
Glucose-Capillary: 233 mg/dL — ABNORMAL HIGH (ref 70–99)
Glucose-Capillary: 215 mg/dL — ABNORMAL HIGH (ref 70–99)
Glucose-Capillary: 151 mg/dL — ABNORMAL HIGH (ref 70–99)

## 2022-10-22 LAB — PROTIME-INR
INR: 1.1 (ref 0.8–1.2)
Prothrombin Time: 14.6 s (ref 11.4–15.2)

## 2022-10-22 MED ORDER — VITAMIN D 25 MCG (1000 UNIT) PO TABS: 1000.0000 [IU] | ORAL_TABLET | Freq: Every day | ORAL | 1 refills | Status: DC

## 2022-10-22 MED ORDER — ENOXAPARIN SODIUM 80 MG/0.8ML IJ SOSY: 70.0000 mg | PREFILLED_SYRINGE | Freq: Two times a day (BID) | INTRAMUSCULAR | Status: DC

## 2022-10-22 MED ORDER — WARFARIN SODIUM 4 MG PO TABS
4.0000 mg | ORAL_TABLET | Freq: Once | ORAL | Status: AC
  Administered 2022-10-22: 4 mg via ORAL
  Filled 2022-10-22: qty 1

## 2022-10-22 MED ORDER — WARFARIN SODIUM 2.5 MG PO TABS: ORAL_TABLET | ORAL | Status: DC

## 2022-10-22 MED ORDER — DOCUSATE SODIUM 100 MG PO CAPS: 100.0000 mg | ORAL_CAPSULE | Freq: Every day | ORAL | 0 refills | Status: DC | PRN

## 2022-10-22 MED ORDER — POLYETHYLENE GLYCOL 3350 17 G PO PACK: 17.0000 g | PACK | Freq: Every day | ORAL | 0 refills | Status: DC | PRN

## 2022-10-22 MED ORDER — NICOTINE 14 MG/24HR TD PT24: 14.0000 mg | MEDICATED_PATCH | Freq: Every day | TRANSDERMAL | 0 refills | Status: DC

## 2022-10-22 MED ORDER — ALPRAZOLAM 1 MG PO TABS
0.5000 mg | ORAL_TABLET | Freq: Every evening | ORAL | 0 refills | Status: DC | PRN
Start: 2022-10-22 — End: 2022-12-02

## 2022-10-22 MED ORDER — HYDROCODONE-ACETAMINOPHEN 5-325 MG PO TABS: 2.0000 | ORAL_TABLET | Freq: Four times a day (QID) | ORAL | 0 refills | Status: DC | PRN

## 2022-10-22 NOTE — TOC Transition Note (Signed)
Transition of Care [facility]) - CM/SW Discharge Note   Patient Details  Name: [name] MRN: [id number] Date of Birth: 05-25-1950  [facility]) CM/SW Contact:  Lorri Frederick, LCSW Phone Number: 10/22/2022, 1:25 PM   Clinical Narrative:   Pt discharging to [facility], room 610a.  RN call report to [phone].    Final next level of care: Skilled Nursing Facility Barriers to Discharge: Barriers Resolved   Patient Goals and CMS Choice   Choice offered to / list presented to : Patient (requesting [facility] or [facility])  Discharge Placement                Patient chooses bed at: [facility] Patient to be transferred to facility by: [facility] Name of family member notified: sister beverly Patient and family notified of of transfer: 10/22/22  Discharge Plan and Services Additional resources added to the After Visit Summary for   In-house Referral: Clinical Social Work   Post Acute Care Choice: Skilled Nursing Facility                               Social Determinants of Health (SDOH) Interventions SDOH Screenings   Food Insecurity: Food Insecurity Present (10/17/2022)  Housing: Patient Declined (10/17/2022)  Transportation Needs: Unmet Transportation Needs (10/17/2022)  Utilities: Not At Risk (10/17/2022)  Alcohol Screen: Low Risk  (05/01/2022)  Depression (PHQ2-9): High Risk (07/01/2022)  Financial Resource Strain: Low Risk  (05/01/2022)  Physical Activity: Inactive (05/01/2022)  Social Connections: Socially Isolated (05/01/2022)  Stress: Stress Concern Present (05/01/2022)  Tobacco Use: High Risk (10/19/2022)     Readmission Risk Interventions     No data to display

## 2022-10-22 NOTE — Discharge Summary (Signed)
Name: [name] MRN: [id number] DOB: January 29, 1951 72 y.o. PCP: Miguel Aschoff, MD  Date of Admission: 10/17/2022 11:30 AM Date of Discharge:  10/22/2022 Attending Physician: Dr. Antony Contras  DISCHARGE DIAGNOSIS:  Primary Problem: Closed left hip fracture, initial encounter [facility])   Hospital Problems: Principal Problem:   Closed left hip fracture, initial encounter [facility]) Active Problems:   Severe major depression (HCC)   Peripheral arterial disease (HCC)   Type 2 diabetes mellitus with other specified complication (HCC)   Chronic CHF (congestive heart failure) (HCC)   PAF (paroxysmal atrial fibrillation) (HCC)   Acute hypoxic respiratory failure (HCC)   Tobacco use disorder   Pulmonary hypertension due to chronic obstructive pulmonary disease (HCC)   COPD exacerbation (HCC)   History of mitral valve replacement with bioprosthetic valve   Encephalopathy   OSA (obstructive sleep apnea)   History of atrial fibrillation   Fall   Chronic anticoagulation   Preop examination   Chronic diastolic heart failure (HCC)   Pulmonary hypertension, unspecified (HCC)   DISCHARGE MEDICATIONS:   Allergies as of 10/22/2022       Reactions   Dexilant [dexlansoprazole] Other (See Comments)   Made acid reflux worse   Lorazepam Other (See Comments)   Patient's sister noted that ativan caused the patient to become extremely confused during hospitalization 09/2010; tolerates Xanax   Oxycontin [oxycodone] Other (See Comments)   headache   Tramadol Hcl Swelling   Ankle swelling        Medication List     TAKE these medications    albuterol 108 (90 Base) MCG/ACT inhaler Commonly known as: ProAir HFA Inhale 2 puffs into the lungs every 6 (six) hours as needed for shortness of breath.   ALPRAZolam 1 MG tablet Commonly known as: XANAX Take 1/2 - 1 tablet (1/2  - 1 mg total) by mouth at bedtime as needed for anxiety or sleep. May take 1/2 tablet during the day if needed for  anxiety   buPROPion 150 MG 24 hr tablet Commonly known as: Wellbutrin XL Take 1 tablet (150 mg total) by mouth every morning.   cetirizine 10 MG tablet Commonly known as: ZyrTEC Allergy Take 1 tablet (10 mg total) by mouth daily.   cholecalciferol 25 MCG (1000 UNIT) tablet Commonly known as: VITAMIN D3 Take 1 tablet (1,000 Units total) by mouth daily.   docusate sodium 100 MG capsule Commonly known as: COLACE Take 1 capsule (100 mg total) by mouth daily as needed for mild constipation (use daily until BM become regular).   DULoxetine 60 MG capsule Commonly known as: CYMBALTA Take 1 capsule (60 mg total) by mouth 2 (two) times daily.   enoxaparin 80 MG/0.8ML injection Commonly known as: LOVENOX Inject 0.7 mLs (70 mg total) into the skin every 12 (twelve) hours. Start taking on: October 23, 2022   FreeStyle Libre 3 Sensor Misc Place 1 sensor on the skin every 14 days. Use to check glucose 6 times daily as directed   furosemide 80 MG tablet Commonly known as: LASIX Take 1 tablet (80 mg total) by mouth every morning.   gabapentin 600 MG tablet Commonly known as: NEURONTIN Take 600 mg by mouth at bedtime.   HYDROcodone-acetaminophen 5-325 MG tablet Commonly known as: NORCO/VICODIN Take 1-2 tablets by mouth 3 (three) times daily. What changed:  how much to take when to take this reasons to take this   ipratropium 0.03 % nasal spray Commonly known as: ATROVENT Instill 2 sprays into the nose  3 (three) times daily as directed   ipratropium-albuterol 0.5-2.5 (3) MG/3ML Soln Commonly known as: DUONEB Inhale 3 mLs by nebulization every 6 (six) hours as needed (shortness of breath, wheezing).   ketoconazole 2 % cream Commonly known as: NIZORAL Apply 1 application topically at bedtime as needed for irritation.   lidocaine-prilocaine cream Commonly known as: EMLA Apply 1 Application topically as needed.   Linzess 72 MCG capsule Generic drug: linaclotide Take 1  capsule (72 mcg total) by mouth daily before breakfast.   metoprolol succinate 25 MG 24 hr tablet Commonly known as: Toprol XL Take 1 tablet (25 mg total) by mouth daily.   nicotine 14 mg/24hr patch Commonly known as: NICODERM CQ - dosed in mg/24 hours Place 1 patch (14 mg total) onto the skin daily at 6 (six) AM. Start taking on: October 23, 2022   ondansetron 4 MG tablet Commonly known as: Zofran Take 1 tablet (4 mg total) by mouth every 8 (eight) hours as needed for nausea or vomiting.   OXYGEN Inhale 2 L into the lungs at bedtime.   Ozempic (1 MG/DOSE) 4 MG/3ML Sopn Generic drug: Semaglutide (1 MG/DOSE) Inject 1 mg into the skin once a week.   polyethylene glycol 17 g packet Commonly known as: MIRALAX / GLYCOLAX Take 17 g by mouth daily as needed (use daily until BM become regular).   potassium chloride SA 20 MEQ tablet Commonly known as: KLOR-CON M Take 1 tablet (20 mEq total) by mouth daily.   rosuvastatin 20 MG tablet Commonly known as: CRESTOR Take 1 tablet (20 mg total) by mouth at bedtime.   Trelegy Ellipta 100-62.5-25 MCG/ACT Aepb Generic drug: Fluticasone-Umeclidin-Vilant Inhale 1 puff into the lungs daily.   Evaristo Bury FlexTouch 100 UNIT/ML FlexTouch Pen Generic drug: insulin degludec Inject 44 Units into the skin daily.   warfarin 2.5 MG tablet Commonly known as: COUMADIN Take 1 tablet (2.5mg ) by mouth on Tuesday, Friday and Sunday. Take half-a-tablet (1.25mg ) on Monday, Wednesday, Thursday, Saturday. Or take as instructed What changed: additional instructions        DISPOSITION AND FOLLOW-UP:  [name] was discharged from [facility] in Good condition. At the hospital follow up visit please address:  Follow-up Recommendations: Consults:Orthopedics  Labs: Basic Metabolic Profile CBC Studies: Will need incision check and XR Medications: Warfarin and furosemide need titration  Follow-up Appointments:  Contact  information for follow-up providers     [employer] Schedule an appointment as soon as possible for a visit in 2 week(s).   Why: For wound re-check and X-ray. Follow up on your hip Contact information: [facility] [address] [place] [address] [phone]         Miguel Aschoff, MD Follow up in 20 day(s).   Specialty: Internal Medicine Why: At 10:15AM Contact information: [address] [place] [address] [phone]              Contact information for after-discharge care     Destination     [facility] .   Service: Skilled Nursing Contact information: [address] Test Update Address [place] [address] [phone]                     HOSPITAL COURSE:  Patient Summary: [name] was admitted because she fell asleep on her chair, fell and was found to have a LEFT intertrochanteric hip fracture.   Closed left intertrochanteric hip fracture  PS/p IMN on 9/8. Surgical scar has been healing well. Pain has been somewhat difficult to control. She is to:   -Hydrocodone-acetaminophen 5-325mg  2 tablets q6 hours   She is Vitamin D deficient. Please start: -Vitamin D supplementation at 1000 international units daily -encourage PO Ca intake -Will need follow up with Ortho in 14 days for incision check and XR  -please ensure incision remains clean and dry, free of infection.    Atrial fibrillation Status post MVR with bioprosthetic mitral valve History of embolism 2/2 heart procedure  Warfarin was held for surgery s/p Vitamin K. INR 2.3 at admission decreased to 1.5. She is now on enoxaparin as a bridge to restart her Warfarin. She restarted her warfarin but INR is NOT at goal. INR is 1.1 today.  - continue bridging until INR at goal of 2-2.5 -give warfarin dose of 2.5mg  Tue/Fri/Sun and 1.25mg  all other days  - FU with INR and titrate  warfarin PRN - Continue metoprolol XL 25 mg daily    COPD with exacerbation Reported increased sputum but not changed in color (yellow white), SOB, requiring O2 (none at baseline just during her sleep with CPAP) at the time of her admission. She was not grossly volume overloaded.  CTA chest without PE but does show some interstitial infiltrates. [name] was treated with bronchodilators, prednisone, azithromycin for COPD exacerbation with sputum. S/p 1 dose of IV Lasix to treat concomitant subclinical heart failure syndrome.  Given no signs of infection there was a low suspicion for community-acquired pneumonia.  -Continue with Duoneb Inhale 3 mLs by nebulization every 6 (six) hours as needed (shortness of breath, wheezing)  -Continue with Atrovent Instill 2 sprays into the nose 3 (three) times daily -Use albuterol for rescue. Inhale 2 puffs into the lungs every 6 (six) hours as needed for shortness of breath.    Chronic congestive heart failure Well compensated with no signs of volume overload during her hospitalization. Lasix was held due to a light increased Cr which is now at baseline. She did not receive her home dose furosemide during the rest of her hospitalization since her volume status has remained low.  She takes 80mg  daily. She is to resume taking this along with oral potassium, but please titrate if this dose is too high for her.  -Lasix 80 mg every morning. -Reassess for volume status and adjust dose PRN -Consider BMP -Metoprolol succinate 25mg  daily   Elevated Cr Baseline is ~0.98-1.0 . At baseline today.  -Consider following up with BMP    Diabetes on insulin Glucose well-controlled at present. Tresiba 44 units daily at home. Was on a SSI due to prednisone. She is to continue with: -Tresiba 44 units daily  -Semaglutide 1 mg daily    Encephalopathy Fall Delirium There were times she thought that someone was coming into her room to cut the tubes of her CPAP.  This would resolve overnight. Continue to monitor.    OSA  Chronic and stable. Cw with CPAP with bleed O2. Cw same.     Severe major depression Anxiety Continue home medications. - Duloxetine, bupropion - 0.5 to 1 mg Xanax as needed at bedtime   Peripheral arterial disease Lower extremities bilaterally with decent perfusion on exam today. - Continue rosuvastatin   Tobacco use disorder Seems precontemplative regarding quitting. - Nicotine replacement   Constipation  Linzess 72mg  daily , Miralax daily PRN, colace daily PRN     DISCHARGE INSTRUCTIONS:   [name] was admitted because  she fell asleep on her chair, fell and had a broken hip. This has been very painful for her.   For her pain:  -Hydrocodone-acetaminophen 5-325mg  2 tablets q6 hours   She is Vitamin D deficient. Please start: -Vitamin D supplementation at 1000 international units daily -encourage PO Ca intake -Will need follow up with Ortho in 14 days for incision check and XR (see follow up appointment section)  During her surgery the Warfarin was stopped and she is now currently bridged with enoxaparin and Warfarin. Her INR today is 1.1 Goal is 2-2.5 because she has a history of a bleed.  -Please measure INR and titrate warfarin as needed.  -give warfarin dose of 2.5mg  Tue/Fri/Sun and 1.25mg  all other days   [name] reported increased sputum but not changed in color (yellow white), Shortness of breath and required oxygen (none at baseline just during her sleep with CPAP) the day of her admission. Imaging was concerning for possible pneumonia. She was treated with bronchodilators, prednisone, azithromycin for COPD exacerbation with sputum and received a diuretic to treat concomitant subclinical heart failure syndrome. Given no signs of infection there was a low suspicion for community-acquired pneumonia. She is to continue taking her home inhalers:  -Continue with Duoneb Inhale 3 mLs by  nebulization every 6 (six) hours as needed (shortness of breath, wheezing)  -Continue with Atrovent Instill 2 sprays into the nose 3 (three) times daily -Use albuterol for rescue. Inhale 2 puffs into the lungs every 6 (six) hours as needed for shortness of breath.   For her heart failure, please monitor daily weights. If increased in more than 3lbs in a 24 hour period or 5 lbs in a week then she will need to give [place] a call. She was diuresed intially the first day of her hospitalization. She did not receive her home dose furosemide during the rest of her hospitalization since her volume status has remained low.  She takes 80mg  daily. She is to resume taking Furosemide along with oral potassium, but please titrate if this dose is too high for her.   -Lasix 80 mg every morning. -Reassess for volume status and adjust dose PRN -Metoprolol succinate 25mg  daily   Diabetes on insulin Glucose well-controlled at present.  Will need Tresiba 44 units daily  Semaglutide 1mg  daily  OSA  Chronic and stable. Cw with CPAP with bleed O2. Cw same.    Severe major depression Anxiety Continue home medications. - Duloxetine 60mg  twice daily - bupropion 150mg  every morning - 0.5 to 1 mg Xanax as needed at bedtime   Peripheral arterial disease Lower extremities bilaterally with decent perfusion on exam today. - Continue rosuvastatin    Tobacco use disorder Seems precontemplative regarding quitting. - Nicotine replacement   Constipation  Please make sure she has had a bowel movement.  Linzess 72mg  daily , Miralax daily, colace daily    SUBJECTIVE:  Feeling better today and ready to go to SNF.   Discharge Vitals:   BP (!) 155/55 (BP Location: Left Arm)   Pulse 63   Temp 98.4 F (36.9 C) (Oral)   Resp 17   Ht 5\' 2"  (1.575 m)   Wt 73 kg   SpO2 96%   BMI 29.44 kg/m   OBJECTIVE:  Physical Exam Constitutional:      General: She is not in acute distress.    Appearance: She is not  ill-appearing.  Cardiovascular:     Rate and Rhythm: Normal rate and regular rhythm.  Heart sounds: No murmur heard.    No friction rub. No gallop.  Pulmonary:     Effort: Pulmonary effort is normal. No respiratory distress.     Breath sounds: No decreased breath sounds, wheezing, rhonchi or rales.  Abdominal:     General: Bowel sounds are normal. There is no distension.     Tenderness: There is no abdominal tenderness. There is no guarding or rebound.  Musculoskeletal:     Right lower leg: No edema.     Left lower leg: No edema.  Skin:    General: Skin is warm and dry.     Capillary Refill: Capillary refill takes less than 2 seconds.  Neurological:     General: No focal deficit present.     Mental Status: She is alert.     Pertinent Labs, Studies, and Procedures:     Latest Ref Rng & Units 10/22/2022    4:34 AM 10/21/2022    4:14 AM 10/20/2022    4:09 AM  CBC  WBC 4.0 - 10.5 K/uL 7.1  8.1  9.0   Hemoglobin 12.0 - 15.0 g/dL 8.6  9.3  9.7   Hematocrit 36.0 - 46.0 % 28.9  31.8  33.7   Platelets 150 - 400 K/uL 194  203  203        Latest Ref Rng & Units 10/22/2022    4:34 AM 10/20/2022   10:05 AM 10/19/2022    4:58 AM  CMP  Glucose 70 - 99 mg/dL 846  962  952   BUN 8 - 23 mg/dL 20  16  25    Creatinine 0.44 - 1.00 mg/dL 8.41  3.24  4.01   Sodium 135 - 145 mmol/L 137  140  141   Potassium 3.5 - 5.1 mmol/L 4.3  4.2  4.6   Chloride 98 - 111 mmol/L 101  107  101   CO2 22 - 32 mmol/L 28  25  30    Calcium 8.9 - 10.3 mg/dL 8.9  8.8  9.3     ECHOCARDIOGRAM COMPLETE  Result Date: 10/18/2022    ECHOCARDIOGRAM REPORT   Patient Name:   KEVONNA OCEGUEDA [facility] Date of Exam: 10/18/2022 Medical Rec #:  [id number]              Height:       62.0 in Accession #:    [id number]             Weight:       160.9 lb Date of Birth:  09-18-1950              BSA:          1.743 m Patient Age:    72 years               BP:           138/112 mmHg Patient Gender: F                      HR:           84 bpm.  Exam Location:  Inpatient Procedure: 2D Echo, Color Doppler and Cardiac Doppler Indications:    pulmonary hypertension  History:        Patient has prior history of Echocardiogram examinations, most                 recent 01/17/2020. COPD, PAD and chronic kidney disease; Risk  Factors:Sleep Apnea, Current Smoker, Diabetes, Hypertension and                 Dyslipidemia.                  Mitral Valve: bioprosthetic valve valve is present in the mitral                 position.  Sonographer:    Delcie Roch RDCS Referring Phys: [id number] LINDSAY B ROBERTS IMPRESSIONS  1. Left ventricular ejection fraction, by estimation, is 65 to 70%. The left ventricle has normal function. The left ventricle has no regional wall motion abnormalities. Left ventricular diastolic parameters are indeterminate.  2. Right ventricular systolic function is normal. The right ventricular size is normal. Tricuspid regurgitation signal is inadequate for assessing PA pressure.  3. Left atrial size was mildly dilated.  4. The mitral valve has been repaired/replaced. Trivial mitral valve regurgitation. The mean mitral valve gradient is 7.0 mmHg with average heart rate of 84 bpm. There is a bioprosthetic valve present in the mitral position.  5. The aortic valve was not well visualized. Aortic valve regurgitation is mild. Mild aortic valve stenosis.  6. The inferior vena cava is normal in size with greater than 50% respiratory variability, suggesting right atrial pressure of 3 mmHg. FINDINGS  Left Ventricle: Left ventricular ejection fraction, by estimation, is 65 to 70%. The left ventricle has normal function. The left ventricle has no regional wall motion abnormalities. The left ventricular internal cavity size was normal in size. There is  no left ventricular hypertrophy. Left ventricular diastolic parameters are indeterminate. Right Ventricle: The right ventricular size is normal. No increase in right ventricular wall thickness.  Right ventricular systolic function is normal. Tricuspid regurgitation signal is inadequate for assessing PA pressure. Left Atrium: Left atrial size was mildly dilated. Right Atrium: Right atrial size was normal in size. Pericardium: There is no evidence of pericardial effusion. Mitral Valve: The mitral valve has been repaired/replaced. Trivial mitral valve regurgitation. There is a bioprosthetic valve present in the mitral position. MV peak gradient, 17.6 mmHg. The mean mitral valve gradient is 7.0 mmHg with average heart rate of 84 bpm. Tricuspid Valve: The tricuspid valve is normal in structure. Tricuspid valve regurgitation is trivial. Aortic Valve: The aortic valve was not well visualized. Aortic valve regurgitation is mild. Aortic regurgitation PHT measures 300 msec. Mild aortic stenosis is present. Aortic valve mean gradient measures 9.0 mmHg. Aortic valve peak gradient measures 16.6 mmHg. Aortic valve area, by VTI measures 1.39 cm. Pulmonic Valve: The pulmonic valve was not well visualized. Pulmonic valve regurgitation is not visualized. Aorta: The aortic root is normal in size and structure. Venous: The inferior vena cava is normal in size with greater than 50% respiratory variability, suggesting right atrial pressure of 3 mmHg. IAS/Shunts: The interatrial septum was not well visualized.  LEFT VENTRICLE PLAX 2D LVIDd:         4.80 cm LVIDs:         3.30 cm LV PW:         1.00 cm LV IVS:        0.90 cm LVOT diam:     1.80 cm LV SV:         57 LV SV Index:   32 LVOT Area:     2.54 cm  RIGHT VENTRICLE            IVC RV Basal diam:  2.10 cm    IVC diam:  1.40 cm RV S prime:     6.09 cm/s LEFT ATRIUM             Index        RIGHT ATRIUM           Index LA diam:        4.90 cm 2.81 cm/m   RA Area:     12.30 cm LA Vol (A2C):   71.7 ml 41.14 ml/m  RA Volume:   26.00 ml  14.92 ml/m LA Vol (A4C):   55.8 ml 32.01 ml/m LA Biplane Vol: 66.6 ml 38.21 ml/m  AORTIC VALVE AV Area (Vmax):    1.21 cm AV Area (Vmean):    1.25 cm AV Area (VTI):     1.39 cm AV Vmax:           204.00 cm/s AV Vmean:          140.000 cm/s AV VTI:            0.407 m AV Peak Grad:      16.6 mmHg AV Mean Grad:      9.0 mmHg LVOT Vmax:         97.05 cm/s LVOT Vmean:        68.700 cm/s LVOT VTI:          0.222 m LVOT/AV VTI ratio: 0.55 AI PHT:            300 msec  AORTA Ao Root diam: 2.40 cm Ao Asc diam:  2.30 cm MITRAL VALVE MV Area (PHT): 3.17 cm     SHUNTS MV Area VTI:   1.26 cm     Systemic VTI:  0.22 m MV Peak grad:  17.6 mmHg    Systemic Diam: 1.80 cm MV Mean grad:  7.0 mmHg MV Vmax:       2.10 m/s MV Vmean:      122.0 cm/s MV Decel Time: 239 msec MV E velocity: 200.00 cm/s MV A velocity: 140.00 cm/s MV E/A ratio:  1.43 Epifanio Lesches MD Electronically signed by Epifanio Lesches MD Signature Date/Time: 10/18/2022/3:22:53 PM    Final    DG Hip Unilat W or Wo Pelvis 2-3 Views Left  Result Date: 10/17/2022 CLINICAL DATA:  Status post fall EXAM: DG HIP (WITH OR WITHOUT PELVIS) 2-3V LEFT; LEFT FEMUR 2 VIEWS COMPARISON:  None available FINDINGS: Left hip: Acute angulated mildly displaced intratrochanteric fracture of the left proximal femur. Soft tissues are unremarkable. Left femur: No additional fracture or dislocation of the left femur. IMPRESSION: Acute angulated, mildly displaced intertrochanteric fracture of the left proximal femur. Electronically Signed   By: Acquanetta Belling M.D.   On: 10/17/2022 15:34   DG Femur Min 2 Views Left  Result Date: 10/17/2022 CLINICAL DATA:  Status post fall EXAM: DG HIP (WITH OR WITHOUT PELVIS) 2-3V LEFT; LEFT FEMUR 2 VIEWS COMPARISON:  None available FINDINGS: Left hip: Acute angulated mildly displaced intratrochanteric fracture of the left proximal femur. Soft tissues are unremarkable. Left femur: No additional fracture or dislocation of the left femur. IMPRESSION: Acute angulated, mildly displaced intertrochanteric fracture of the left proximal femur. Electronically Signed   By: Acquanetta Belling M.D.   On:  10/17/2022 15:34   CT Cervical Spine Wo Contrast  Result Date: 10/17/2022 CLINICAL DATA:  Neck trauma.  Mechanical fall.  Midline tenderness. EXAM: CT CERVICAL SPINE WITHOUT CONTRAST TECHNIQUE: Multidetector CT imaging of the cervical spine was performed without intravenous contrast. Multiplanar CT image reconstructions were also generated. RADIATION  DOSE REDUCTION: This exam was performed according to the departmental dose-optimization program which includes automated exposure control, adjustment of the mA and/or kV according to patient size and/or use of iterative reconstruction technique. COMPARISON:  CT of cervical spine 05/22/2022 FINDINGS: Alignment: Slight degenerative anterolisthesis at C5-6 is stable. Straightening of the normal cervical lordosis is similar the prior exam. Skull base and vertebrae: The craniocervical junction is normal. Vertebral body heights are normal. No acute fractures are present. Soft tissues and spinal canal: No prevertebral fluid or swelling. No visible canal hematoma. Disc levels:  No significant focal stenosis is present. Upper chest: Patchy airspace opacities are present in both lungs, likely representing edema or atelectasis. IMPRESSION: 1. No acute fracture or traumatic subluxation. 2. Stable degenerative anterolisthesis at C5-6. 3. Patchy airspace opacities in both lungs, likely representing edema or atelectasis. Electronically Signed   By: Marin Roberts M.D.   On: 10/17/2022 14:44   CT ABDOMEN PELVIS W CONTRAST  Result Date: 10/17/2022 CLINICAL DATA:  Fall.  Abdominal pain.  On anticoagulation. EXAM: CT ABDOMEN AND PELVIS WITH CONTRAST TECHNIQUE: Multidetector CT imaging of the abdomen and pelvis was performed using the standard protocol following bolus administration of intravenous contrast. RADIATION DOSE REDUCTION: This exam was performed according to the departmental dose-optimization program which includes automated exposure control, adjustment of the mA  and/or kV according to patient size and/or use of iterative reconstruction technique. CONTRAST:  75mL OMNIPAQUE IOHEXOL 350 MG/ML SOLN COMPARISON:  None Available. FINDINGS: Lower chest: Tiny right pleural effusion. Bibasilar interstitial infiltrates, suspicious for interstitial edema or pneumonitis. Old bilateral rib fracture deformities noted. Hepatobiliary: No hepatic laceration or mass identified. Gallbladder is unremarkable. No evidence of biliary ductal dilatation. Pancreas: No parenchymal laceration, mass, or inflammatory changes identified. Spleen: No evidence of splenic laceration. Adrenal/Urinary Tract: No hemorrhage or parenchymal lacerations identified. Stable old cryoablation defect involving the lower pole of the left kidney. 6 mm right renal calculus again seen. No evidence of suspicious renal masses or hydronephrosis. Stomach/Bowel: Unopacified bowel loops are unremarkable in appearance. No evidence of hemoperitoneum. Vascular/Lymphatic: No evidence of abdominal aortic injury. No pathologically enlarged lymph nodes identified. Reproductive:  No mass or other significant abnormality identified. Other:  None. Musculoskeletal: Comminuted intertrochanteric left hip fracture is seen. No evidence of dislocation. IMPRESSION: Comminuted intertrochanteric left hip fracture. No evidence of abdominal or pelvic visceral injury. Right nephrolithiasis. No evidence of ureteral calculi or hydronephrosis. Tiny right pleural effusion. Bibasilar interstitial infiltrates, suspicious for interstitial edema or pneumonitis. Electronically Signed   By: Danae Orleans M.D.   On: 10/17/2022 14:44   CT HEAD WO CONTRAST ( )  Result Date: 10/17/2022 CLINICAL DATA:  Mechanical fall. Patient takes blood thinners. Head trauma. EXAM: CT HEAD WITHOUT CONTRAST TECHNIQUE: Contiguous axial images were obtained from the base of the skull through the vertex without intravenous contrast. RADIATION DOSE REDUCTION: This exam was performed  according to the departmental dose-optimization program which includes automated exposure control, adjustment of the mA and/or kV according to patient size and/or use of iterative reconstruction technique. COMPARISON:  CT head without contrast 05/22/2022 FINDINGS: Brain: No acute infarct, hemorrhage, or mass lesion is present. Mild periventricular white matter changes are stable. Deep brain nuclei are within normal limits. The ventricles are of normal size. No significant extraaxial fluid collection is present. The brainstem and cerebellum are within normal limits. Vascular: Atherosclerotic calcifications are again seen at the cavernous internal carotid arteries bilaterally. No hyperdense vessel is present. Skull: Calvarium is intact. No focal lytic or blastic  lesions are present. No significant extracranial soft tissue lesion is present. Sinuses/Orbits: A polyp or mucous retention cyst is again noted within the left maxillary sinus. The paranasal sinuses and mastoid air cells are otherwise clear. Bilateral lens replacements are noted. Globes and orbits are otherwise unremarkable. IMPRESSION: 1. No acute intracranial abnormality or significant interval change. 2. Stable mild periventricular white matter disease. This likely reflects the sequela of chronic microvascular ischemia. Electronically Signed   By: Marin Roberts M.D.   On: 10/17/2022 14:37   CT Angio Chest PE W and/or Wo Contrast  Result Date: 10/17/2022 CLINICAL DATA:  Shortness of breath. Hypoxia. Fall. On anticoagulation. EXAM: CT ANGIOGRAPHY CHEST WITH CONTRAST TECHNIQUE: Multidetector CT imaging of the chest was performed using the standard protocol during bolus administration of intravenous contrast. Multiplanar CT image reconstructions and MIPs were obtained to evaluate the vascular anatomy. RADIATION DOSE REDUCTION: This exam was performed according to the departmental dose-optimization program which includes automated exposure control,  adjustment of the mA and/or kV according to patient size and/or use of iterative reconstruction technique. CONTRAST:  75mL OMNIPAQUE IOHEXOL 350 MG/ML SOLN COMPARISON:  06/13/2022 FINDINGS: Cardiovascular: No evidence of thoracic aortic injury. No evidence of thoracic aortic dissection or aneurysm. Satisfactory opacification of the pulmonary arteries noted, and no pulmonary emboli identified. Aortic and coronary atherosclerotic calcification incidentally noted. Prior CABG noted. Mediastinum/Nodes: No masses or pathologically enlarged lymph nodes identified. Lungs/Pleura: No pneumothorax. Moderate centrilobular emphysema again seen. Diffuse interstitial infiltrates are new since previous study, and may be due to interstitial edema or pneumonitis. No evidence of pulmonary consolidation or mass. Tiny right pleural effusion is new, and mild dependent atelectasis is seen in both lower lobes. Upper abdomen: No acute findings. Gallstones are seen, however there is no evidence of cholecystitis or biliary dilatation. Musculoskeletal: No acute fractures identified. 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 Review of the MIP images confirms the above findings. IMPRESSION: No evidence of traumatic injury.  No evidence of pulmonary embolism. New diffuse interstitial infiltrates and tiny right pleural effusion, which may be due to interstitial edema/congestive heart failure or pneumonitis. Cholelithiasis. No radiographic evidence of cholecystitis. Aortic Atherosclerosis (ICD10-I70.0) and Emphysema (ICD10-J43.9). Electronically Signed   By: Danae Orleans M.D.   On: 10/17/2022 14:30   DG Chest Portable 1 View  Result Date: 10/17/2022 CLINICAL DATA:  Fall with shortness of EXAM: PORTABLE CHEST 1 VIEW COMPARISON:  08/21/2022 FINDINGS: Prior median sternotomy. Heart size upper normal. Chronic hyperinflation and bronchial thickening. Superimposed peripheral septal thickening suspicious for pulmonary edema. No  pneumothorax or large pleural effusion. Minimal ill-defined right perihilar opacity. Remote right clavicle fracture post fixation. Remote right rib fractures. IMPRESSION: 1. Chronic hyperinflation and bronchial thickening. 2. Superimposed peripheral septal thickening suspicious for pulmonary edema. 3. Minimal ill-defined right perihilar opacity, favor atelectasis. Electronically Signed   By: Narda Rutherford M.D.   On: 10/17/2022 13:27   DG Pelvis Portable  Result Date: 10/17/2022 CLINICAL DATA:  Fall, pain. EXAM: PORTABLE PELVIS 1-2 VIEWS COMPARISON:  None Available. FINDINGS: Displaced left proximal femur fracture is predominantly intertrochanteric, possibly involving the femoral neck/intertrochanteric junction. The fracture does involve the upper aspect of the greater trochanter. Apex lateral angulation with shortening of the femoral shaft. Femoral head remains seated, no dislocation. There is bilateral hip joint space narrowing. Intact bony pelvis including pubic rami. No pubic symphyseal or sacroiliac diastasis. IMPRESSION: Displaced and angulated intertrochanteric left femur fracture possibly involving the femoral neck junction. Electronically Signed   By:  Narda Rutherford M.D.   On: 10/17/2022 13:25     Signed: Manuela Neptune, MD Internal Medicine Resident, PGY-1 [facility]  Pager: [phone] 1:11 PM, 10/22/2022

## 2022-10-22 NOTE — Progress Notes (Signed)
Physical Therapy Treatment Patient Details Name: [name] MRN: [id number] DOB: [birth year] Today's Date: 10/22/2022   History of Present Illness Pt is a 72 year old woman admitted on 10/17/22 after falling out of her kitchen chair, resulting in L hip fx. Underwent IM nail on 10/19/22. PMH: COPD on 2L home O2, pulmonary HTN, HTN, CHF, afib, DM2, anxiety, depression, fibromyalgia, arthritis, PVD, chronic back pain, renal cell carcinoma, sleppapnea, CVA, MVR.    PT Comments  Pt seen for PT tx with pt agreeable. Pt is making good progress with functional mobility as she's able to complete bed mobility with hospital bed features without physical assistance & is able to ambulate into hallway with RW & CGA. Pt with c/o 8/10 pain in L hip with movement. Pt appears anxious over several things with PT providing listening & encouragement throughout session. (Pt reports she's on 2L/min O2 at home when she's asleep only.)   If plan is discharge home, recommend the following: A little help with bathing/dressing/bathroom;Assistance with cooking/housework;Assist for transportation;Help with stairs or ramp for entrance;A little help with walking and/or transfers   Can travel by private vehicle     No  Equipment Recommendations  None recommended by PT (defer to next venue)    Recommendations for Other Services       Precautions / Restrictions Precautions Precautions: Fall Restrictions Weight Bearing Restrictions: Yes LLE Weight Bearing: Weight bearing as tolerated     Mobility  Bed Mobility Overal bed mobility: Needs Assistance Bed Mobility: Supine to Sit     Supine to sit: Supervision, HOB elevated, Used rails          Transfers Overall transfer level: Needs assistance Equipment used: Rolling walker (2 wheels) Transfers: Sit to/from Stand Sit to Stand: Contact guard assist, Supervision           General transfer comment: CGA fade to supervision for STS from EOB, recliner  with RW & cuing re: hand placement/technique    Ambulation/Gait Ambulation/Gait assistance: Contact guard assist Gait Distance (Feet): 50 Feet Assistive device: Rolling walker (2 wheels) Gait Pattern/deviations: Decreased step length - right, Decreased stride length, Decreased step length - left, Decreased dorsiflexion - left Gait velocity: decreased     General Gait Details: Pt beginning to shake but reports this is 2/2 pain & anxiety.   Stairs             Wheelchair Mobility     Tilt Bed    Modified Rankin (Stroke Patients Only)       Balance Overall balance assessment: Needs assistance Sitting-balance support: Bilateral upper extremity supported Sitting balance-Leahy Scale: Fair     Standing balance support: Bilateral upper extremity supported, Reliant on assistive device for balance Standing balance-Leahy Scale: Fair                              Cognition Arousal: Alert Behavior During Therapy: WFL for tasks assessed/performed, Anxious Overall Cognitive Status: Impaired/Different from baseline Area of Impairment: Awareness, Safety/judgement, Problem solving                       Following Commands: Follows one step commands with increased time   Awareness: Anticipatory Problem Solving: Slow processing, Requires verbal cues          Exercises Other Exercises Other Exercises: Pt noted to be soiled with coffee, PT provided assistance with changing gown.    General Comments General comments (  skin integrity, edema, etc.): Pt with inconsistent pleth waveform, but SPO2 >/= 90% at rest, some low readings but questions accuracy of these. Pt reports she's on 2L/min supplemental O2 when she sleeps.      Pertinent Vitals/Pain Pain Assessment Pain Assessment: 0-10 Pain Score: 8  Pain Location: L hip Pain Descriptors / Indicators: Discomfort, Grimacing, Constant, Guarding Pain Intervention(s): Monitored during session, Limited activity  within patient's tolerance    Home Living                          Prior Function            PT Goals (current goals can now be found in the care plan section) Acute Rehab PT Goals Patient Stated Goal: Did not specifically state, but agreaable to OOB PT Goal Formulation: With patient Time For Goal Achievement: 11/03/22 Potential to Achieve Goals: Good Progress towards PT goals: Progressing toward goals    Frequency    Min 1X/week      PT Plan      Co-evaluation              AM-PAC PT "6 Clicks" Mobility   Outcome Measure  Help needed turning from your back to your side while in a flat bed without using bedrails?: None Help needed moving from lying on your back to sitting on the side of a flat bed without using bedrails?: A Little Help needed moving to and from a bed to a chair (including a wheelchair)?: A Little Help needed standing up from a chair using your arms (e.g., wheelchair or bedside chair)?: A Little Help needed to walk in hospital room?: A Little Help needed climbing 3-5 steps with a railing? : A Little 6 Click Score: 19    End of Session Equipment Utilized During Treatment: Gait belt Activity Tolerance: Patient tolerated treatment well;Patient limited by pain Patient left: in chair;with chair alarm set;with call bell/phone within reach   PT Visit Diagnosis: Muscle weakness (generalized) (M62.81);Pain;Unsteadiness on feet (R26.81) Pain - Right/Left: Left Pain - part of body: Hip     Time: 4098-1191 PT Time Calculation (min) (ACUTE ONLY): 25 min  Charges:    $Therapeutic Activity: 23-37 mins PT General Charges $$ ACUTE PT VISIT: 1 Visit                     Aleda Grana, PT, DPT 10/22/22, 1:21 PM   Sandi Mariscal 10/22/2022, 1:20 PM

## 2022-10-22 NOTE — Patient Outreach (Signed)
  Care Coordination   Follow Up Visit Note   10/22/2022 Name: [name] MRN: [id number] DOB: 1950/11/23  [name] is a 72 y.o. year old female who sees [name], [name], MD for primary care. I spoke with  [name] by phone today.  What matters to the patients health and wellness today?  Plan is for transfer to [facility] today for rehab     Goals Addressed             This Visit's Progress    Provide support, resources and mental health support       Activities and task to complete in order to accomplish goals.    Transferring to SNF rehab (today?) for inpatient PT and OT  Continue working with the [facility] team on plans to move forward with home repairs/adaptations to be made- make [facility] aware of questions/needs Call [phone] anytime 24/7 for mental health crisis support Continue with positive self talk Try listening to music/tv, coloring or other activities to help distract you from feelings of worry, sadness, etc. Call insurance Concierge and inquire about copay for counseling- I am happy to help connect you if agreeable- Ask PCP about SC[employer] application at your visit this week-for hopeful approval of door to door service and not curbside- I will pickup and fax to [facility]  Consider participating in activities outside the home- [facility],etc Follow up with [employer] program for in-home help/possibly rides too- maybe they can help with the cleaning of walls(smoke) Work on the decluttering goal we discussed- 1bag/box on street for bulk pickup trash day Continue to be productive  Follow up visits planned for mammorgram, ophthalmologist, pulmonary and vascular-          SDOH assessments and interventions completed:  Yes     Care Coordination Interventions:  Yes, provided  Interventions Today    Flowsheet Row Most Recent Value  Chronic Disease   Chronic disease during today's visit Other  [s/p fall with hip  fx and repair]  General Interventions   General Interventions Discussed/Reviewed [facility], General Interventions Discussed, Communication with  Doctor Visits Discussed/Reviewed Doctor Visits Discussed  [Suggested to pt and sister to get  rescheduled for Pulmonologist appointment set (10/21/22) to another date after SNF rehab]  Level of Care Skilled Nursing Facility  [name] is for SNF transfer to [facility] today for rehab]       Follow up plan: Follow up call scheduled for 10/27/22    Encounter Outcome:  Patient Visit Completed

## 2022-10-22 NOTE — Progress Notes (Signed)
Pt report given to Tina,receiving nurse for [facility].

## 2022-10-22 NOTE — TOC Progression Note (Signed)
Transition of Care [facility]) - Progression Note    Patient Details  Name: [name] MRN: [id number] Date of Birth: 12-10-1950  Transition of Care [facility]) CM/SW Contact  Lorri Frederick, LCSW Phone Number: 10/22/2022, 1:26 PM  Clinical Narrative:   Micah Flesher approved.  SNF, 7 days: [id number], PTAR [id number].  CSW confirmed with Brittany/[facility] that she can receive pt.   MD notified.  Expected Discharge Plan: Skilled Nursing Facility Barriers to Discharge: Barriers Resolved  Expected Discharge Plan and Services In-house Referral: Clinical Social Work   Post Acute Care Choice: Skilled Nursing Facility Living arrangements for the past 2 months: Single Family Home Expected Discharge Date: 10/22/22                                     Social Determinants of Health (SDOH) Interventions SDOH Screenings   Food Insecurity: Food Insecurity Present (10/17/2022)  Housing: Patient Declined (10/17/2022)  Transportation Needs: Unmet Transportation Needs (10/17/2022)  Utilities: Not At Risk (10/17/2022)  Alcohol Screen: Low Risk  (05/01/2022)  Depression (PHQ2-9): High Risk (07/01/2022)  Financial Resource Strain: Low Risk  (05/01/2022)  Physical Activity: Inactive (05/01/2022)  Social Connections: Socially Isolated (05/01/2022)  Stress: Stress Concern Present (05/01/2022)  Tobacco Use: High Risk (10/19/2022)    Readmission Risk Interventions     No data to display

## 2022-10-22 NOTE — Plan of Care (Signed)
  Problem: Education: Goal: Knowledge of General Education information will improve Description: Including pain rating scale, medication(s)/side effects and non-pharmacologic comfort measures Outcome: Completed/Met   Problem: Health Behavior/Discharge Planning: Goal: Ability to manage health-related needs will improve Outcome: Completed/Met   Problem: Clinical Measurements: Goal: Ability to maintain clinical measurements within normal limits will improve Outcome: Completed/Met Goal: Will remain free from infection Outcome: Completed/Met Goal: Diagnostic test results will improve Outcome: Completed/Met Goal: Respiratory complications will improve Outcome: Completed/Met Goal: Cardiovascular complication will be avoided Outcome: Completed/Met   Problem: Activity: Goal: Risk for activity intolerance will decrease Outcome: Completed/Met   Problem: Nutrition: Goal: Adequate nutrition will be maintained Outcome: Completed/Met   Problem: Coping: Goal: Level of anxiety will decrease Outcome: Completed/Met   Problem: Elimination: Goal: Will not experience complications related to bowel motility Outcome: Completed/Met Goal: Will not experience complications related to urinary retention Outcome: Completed/Met   Problem: Pain Managment: Goal: General experience of comfort will improve Outcome: Completed/Met   Problem: Safety: Goal: Ability to remain free from injury will improve Outcome: Completed/Met   Problem: Skin Integrity: Goal: Risk for impaired skin integrity will decrease Outcome: Completed/Met   Problem: Education: Goal: Ability to describe self-care measures that may prevent or decrease complications (Diabetes Survival Skills Education) will improve Outcome: Completed/Met Goal: Individualized Educational Video(s) Outcome: Completed/Met   Problem: Coping: Goal: Ability to adjust to condition or change in health will improve Outcome: Completed/Met   Problem:  Fluid Volume: Goal: Ability to maintain a balanced intake and output will improve Outcome: Completed/Met   Problem: Health Behavior/Discharge Planning: Goal: Ability to identify and utilize available resources and services will improve Outcome: Completed/Met Goal: Ability to manage health-related needs will improve Outcome: Completed/Met   Problem: Metabolic: Goal: Ability to maintain appropriate glucose levels will improve Outcome: Completed/Met   Problem: Nutritional: Goal: Maintenance of adequate nutrition will improve Outcome: Completed/Met Goal: Progress toward achieving an optimal weight will improve Outcome: Completed/Met   Problem: Skin Integrity: Goal: Risk for impaired skin integrity will decrease Outcome: Completed/Met   Problem: Tissue Perfusion: Goal: Adequacy of tissue perfusion will improve Outcome: Completed/Met   Problem: Acute Rehab OT Goals (only OT should resolve) Goal: Pt. Will Perform Grooming Outcome: Completed/Met Goal: Pt. Will Perform Upper Body Dressing Outcome: Completed/Met Goal: Pt. Will Perform Lower Body Dressing Outcome: Completed/Met Goal: Pt. Will Transfer To Toilet Outcome: Completed/Met Goal: Pt. Will Perform Toileting-Clothing Manipulation Outcome: Completed/Met Goal: OT Additional ADL Goal #1 Outcome: Completed/Met   Problem: Acute Rehab PT Goals(only PT should resolve) Goal: Pt Will Go Supine/Side To Sit Outcome: Completed/Met Goal: Pt Will Go Sit To Supine/Side Outcome: Completed/Met Goal: Patient Will Transfer Sit To/From Stand Outcome: Completed/Met Goal: Pt Will Transfer Bed To Chair/Chair To Bed Outcome: Completed/Met Goal: Pt Will Ambulate Outcome: Completed/Met Goal: Pt/caregiver will Perform Home Exercise Program Outcome: Completed/Met

## 2022-10-22 NOTE — Discharge Instructions (Addendum)
  [name] was admitted because she fell asleep on her chair, fell and had a broken hip. This has been very painful for her.   For her pain:  -Hydrocodone-acetaminophen 5-325mg  2 tablets q6 hours   She is Vitamin D deficient. Please start: -Vitamin D supplementation at 1000 international units daily -encourage PO Ca intake -Will need follow up with Ortho in 14 days for incision check and XR (see follow up appointment section)  During her surgery the Warfarin was stopped and she is now currently bridged with enoxaparin and Warfarin. Her INR today is 1.1 Goal is 2-2.5 because she has a history of a bleed.  -Please measure INR and titrate warfarin as needed.  -give warfarin dose of 2.5mg  Tue/Fri/Sun and 1.25mg  all other days   [name] reported increased sputum but not changed in color (yellow white), Shortness of breath and required oxygen (none at baseline just during her sleep with CPAP) the day of her admission. Imaging was concerning for possible pneumonia. She was treated with bronchodilators, prednisone, azithromycin for COPD exacerbation with sputum and received a diuretic to treat concomitant subclinical heart failure syndrome. Given no signs of infection there was a low suspicion for community-acquired pneumonia. She is to continue taking her home inhalers:  -Continue with Duoneb Inhale 3 mLs by nebulization every 6 (six) hours as needed (shortness of breath, wheezing)  -Continue with Atrovent Instill 2 sprays into the nose 3 (three) times daily -Use albuterol for rescue. Inhale 2 puffs into the lungs every 6 (six) hours as needed for shortness of breath.   For her heart failure, please monitor daily weights. If increased in more than 3lbs in a 24 hour period or 5 lbs in a week then she will need to give [place] a call. She was diuresed intially the first day of her hospitalization. She did not receive her home dose furosemide during the rest of her hospitalization  since her volume status has remained low.  She takes 80mg  daily. She is to resume taking Furosemide along with oral potassium, but please titrate if this dose is too high for her.   -Lasix 80 mg every morning. -Reassess for volume status and adjust dose PRN -Metoprolol succinate 25mg  daily   Diabetes on insulin Glucose well-controlled at present.  Will need Tresiba 44 units daily  Semaglutide 1mg  daily  OSA  Chronic and stable. Cw with CPAP with bleed O2. Cw same.    Severe major depression Anxiety Continue home medications. - Duloxetine 60mg  twice daily - bupropion 150mg  every morning - 0.5 to 1 mg Xanax as needed at bedtime   Peripheral arterial disease Lower extremities bilaterally with decent perfusion on exam today. - Continue rosuvastatin    Tobacco use disorder Seems precontemplative regarding quitting. - Nicotine replacement   Constipation  Please make sure she has had a bowel movement.  Linzess 72mg  daily , Miralax daily, colace daily

## 2022-10-22 NOTE — Progress Notes (Addendum)
ANTICOAGULATION CONSULT NOTE   Pharmacy Consult for wafarin Indication: atrial fibrillation with bioMVR  Allergies  Allergen Reactions   Dexilant [Dexlansoprazole] Other (See Comments)    Made acid reflux worse   Lorazepam Other (See Comments)    Patient's sister noted that ativan caused the patient to become extremely confused during hospitalization 09/2010; tolerates Xanax   Oxycontin [Oxycodone] Other (See Comments)    headache   Tramadol Hcl Swelling    Ankle swelling    Patient Measurements: Height: 5\' 2"  (157.5 cm) Weight: 73 kg (160 lb 15 oz) IBW/kg (Calculated) : 50.1   Vital Signs: Temp: 98.4 F (36.9 C) (09/11 0406) Temp Source: Oral (09/10 1946) BP: 161/71 (09/11 0406) Pulse Rate: 63 (09/11 0406)  Labs: Recent Labs    10/20/22 0409 10/20/22 1005 10/21/22 0414 10/22/22 0434  HGB 9.7*  --  9.3* 8.6*  HCT 33.7*  --  31.8* 28.9*  PLT 203  --  203 194  LABPROT 14.9  --  15.2 14.6  INR 1.1  --  1.2 1.1  HEPARINUNFRC 0.13*  --  0.52  --   CREATININE  --  0.91  --  0.84    Estimated Creatinine Clearance: 55.8 mL/min (by C-G formula based on SCr of 0.84 mg/dL).   Medical History: Past Medical History:  Diagnosis Date   Abnormality of lung on CXR 02/02/2020   Nonspecific finding on CXR ordered by pulmonologist - c/w inflammation vs infection, f/u imaging suggested, Dr. Roxy Cedar office has been in communication about recommended next steps.   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   Non-bleeding when visualized on capsule endoscopy 06/30/2015    Arthritis    "lower back; hands" (02/19/2018)   Asthma    Asymptomatic cholelithiasis 09/25/2015   Seen on CT scan 08/2015   Carotid artery stenosis; s/p R endarterectomy    s/p right endarterectomy (06/2010) Carotid US (07/2010):  Left: Moderate-to-severe (60-79%) calcific and  non-calcific plaque origin and proximal ICA and ECA    Chronic congestive heart failure with left ventricular diastolic dysfunction (HCC) 10/21/2010   Chronic constipation 02/03/2011   Chronic daily headache 01/16/2014   Chronic iron deficiency anemia    Chronic low back pain 10/06/2012   Chronic venous insufficiency 08/04/2012   Closed fracture of one rib of left side 08/23/2021   ED visit after a fall 08/16/21    COPD (chronic obstructive pulmonary disease) with emphysema (HCC)    PFTs 2018: severe obstructive disease, insignif response to bronchiodilator, mild restriction parenchymal pattern, moderately severe diffusion defect. 2014  FEV1 0.92 (40%), ratio 69, 27% increase in FEV1 with BD, TLC 91%, severe airtrapping, DLCO49% On chronic home O2. Pulmonary rehab referral 05/2012    Dyspnea    Fibromyalgia 08/29/2010   Gastroesophageal reflux disease    History of blood transfusion    "several times"  (02/19/2018)   History of clear cell renal cell carcinoma (HCC), in remission 07/21/2011   s/p cryoablation of left RCC in 09/2011 by Dr. Fredia Sorrow. Followed by Dr. Retta Diones  [facility]) .     History of hiatal hernia    History of mitral valve replacement with bioprosthetic valve due to mitral stenosis 2012   s/p MVR with a 27-mm pericardial porcine valve (Medtronic Mosaic valve, serial #[id number] on 09/20/10, Dr. Donata Clay)    History of obstructive sleep apnea, resolved 2013   resolved per sleep  study 07/2019; no apnea, but did have desaturation.  CPAP no longer necessary.  Nocturnal polysomnography (06/2009): Moderate sleep apnea/ hypopnea syndrome , AHI 17.8 per hour with nonpositional hypopneas. CPAP titration to 12 CWP, AHI 2.4 per hour. On nocturnal CPAP via a small resMed Quattro full-face mask with heated humidifier.    History of pneumonia    "once"  (02/19/2018)   History of seborrheic keratosis 09/28/2015   History of stroke without residual deficits 01/17/2022   Hyperlipidemia  LDL goal < 100 11/20/2005   Internal and external hemorrhoids without complication 08/04/2012   Lesion of left native kidney 06/01/2020   Incidental finding on recent screening chest CT for lung ca ordered by Dr. Maple Hudson pulmonology - he has ordered a f/u renal U/S.    Lichen sclerosus of female genitalia 01/12/2017   Migraine    "none in years" (02/19/2018)   Mild cognitive impairment with memory loss 12/23/2021   Moderately severe major depression (HCC) 11/19/2005   Nocturnal hypoxia per sleep study 07/2019    Osteoporosis    DEXA 2016: T -2.7; DEXA (12/09/2011): L-spine T -3.7, left hip T -1.4 DEXA (12/2004): L-spine T -2.6, left hip -0.1    Paroxysmal atrial fibrillation (HCC) 10/22/2010   s/p Left atrial maze procedure for paroxysmal atrial fibrillation on 09/20/2010 by Dr Donata Clay.  Subsequent splenic infarct, decision was made to re-anticoagulate with coumadin, likely life-long as this is the most likely cause of the splenic infarct.    Personal history of colonic polyps 05/14/2011   Colonoscopy (05/2011): 4 mm adenomatous polyp excised endoscopically Colonoscopy (02/2002): Adenomatous polyp excised endoscopically    Personal history of renal cell carcinoma 09/12/2020   Pneumonia    Pulmonary hypertension due to chronic obstructive pulmonary disease (HCC) 04/25/2016   2014 TEE w PA peak pressure 46 mmHg, s/p MV replacement    Right nephrolithiasis, asymptomatic, incidental finding 09/06/2014   5 mm non-obstructing calculus seen on CT scan 09/05/2014    Right ventricular failure (HCC) 04/25/2016   Severe obesity (BMI 35.0-39.9) with comorbidity (HCC) 10/23/2011   Sleep apnea    Stage 2 chronic kidney disease due to type 2 diabetes mellitus (HCC) 12/16/2018   CKD stage III     Tobacco abuse 07/28/2012   Type 2 diabetes mellitus with diabetic neuropathy [facility])    Assessment: 72 yo W on warfarin PTA for valvular afib and hx bio MVR. Warfarin was held for [facility] 9/8. Patient received  therapeutic enoxaparin 9/9. Ok to restart warfarin 9/10.   INR 1.1 after 1 dose of warfarin. Hgb slowly down trending, no report of bleeding, likely due to blood draws.  Eating 75% of meals.   Warfarin PTA 2.5mg  Tue/Fri, 1.25mg  all other days. Noted most INRs in the last year have been subtherapeutic.   Goal of Therapy:  INR 2-2.5  due to hx of bleeds Monitor platelets by anticoagulation protocol: Yes   Plan:  Warfarin 4 mg x1 Continue enoxaparin 70mg  Q12hr until INR is 2 or greater Monitor daily INR, CBC, signs/symptoms of bleeding  For discharge- suggest continuing enoxaparin until INR is 2 or greater, give warfarin dose of 2.5mg  Tue/Fri/Sun and 1.25mg  all other days   Alphia Moh, PharmD, BCPS, [facility] Clinical Pharmacist  Please check AMION for all [facility] phone numbers After 10:00 PM, call Main Pharmacy [phone]

## 2022-10-22 NOTE — Telephone Encounter (Signed)
good afternoon a [name] from [employer] [phone] called left a message on [name] stating that you had called them about getting pt set up with an appt .. she stated you can call her back

## 2022-10-23 DIAGNOSIS — I480 Paroxysmal atrial fibrillation: Secondary | ICD-10-CM | POA: Diagnosis not present

## 2022-10-23 DIAGNOSIS — J449 Chronic obstructive pulmonary disease, unspecified: Secondary | ICD-10-CM | POA: Diagnosis not present

## 2022-10-23 DIAGNOSIS — S72002D Fracture of unspecified part of neck of left femur, subsequent encounter for closed fracture with routine healing: Secondary | ICD-10-CM | POA: Diagnosis not present

## 2022-10-23 DIAGNOSIS — G3184 Mild cognitive impairment, so stated: Secondary | ICD-10-CM | POA: Diagnosis not present

## 2022-10-23 DIAGNOSIS — G9340 Encephalopathy, unspecified: Secondary | ICD-10-CM | POA: Diagnosis not present

## 2022-10-23 DIAGNOSIS — R2689 Other abnormalities of gait and mobility: Secondary | ICD-10-CM | POA: Diagnosis not present

## 2022-10-23 DIAGNOSIS — I272 Pulmonary hypertension, unspecified: Secondary | ICD-10-CM | POA: Diagnosis not present

## 2022-10-23 DIAGNOSIS — Z7901 Long term (current) use of anticoagulants: Secondary | ICD-10-CM | POA: Diagnosis not present

## 2022-10-23 DIAGNOSIS — I10 Essential (primary) hypertension: Secondary | ICD-10-CM | POA: Diagnosis not present

## 2022-10-23 DIAGNOSIS — Z8679 Personal history of other diseases of the circulatory system: Secondary | ICD-10-CM | POA: Diagnosis not present

## 2022-10-23 DIAGNOSIS — M6281 Muscle weakness (generalized): Secondary | ICD-10-CM | POA: Diagnosis not present

## 2022-10-23 DIAGNOSIS — J9601 Acute respiratory failure with hypoxia: Secondary | ICD-10-CM | POA: Diagnosis not present

## 2022-10-23 LAB — GLUCOSE, CAPILLARY: Glucose-Capillary: 291 mg/dL — ABNORMAL HIGH (ref 70–99)

## 2022-10-24 ENCOUNTER — Ambulatory Visit: Payer: Self-pay | Admitting: *Deleted

## 2022-10-24 NOTE — Patient Outreach (Signed)
Care Coordination   Follow Up Visit Note   10/24/2022 Name: [name] MRN: [id number] DOB: 05-17-1950  [name] is a 72 y.o. year old female who sees [name], Kathryn Ar, MD for primary care. I spoke with  [name] by phone today.  What matters to the patients health and wellness today?  Moved to [facility] rehab for therapy after hip fx and before returning home.    Goals Addressed             This Visit's Progress    Provide support, resources and mental health support       Activities and task to complete in order to accomplish goals.    Admitted to [facility] rehab this week for for inpatient PT and OT  Continue working with the [facility] team on plans to move forward with home repairs/adaptations to be made- make CHS aware of questions/needs Call 988 anytime 24/7 for mental health crisis support Continue with positive self talk Try listening to music/tv, coloring or other activities to help distract you from feelings of worry, sadness, etc. Call insurance Concierge and inquire about copay for counseling- I am happy to help connect you if agreeable- Ask PCP about SCAT application at your visit this week-for hopeful approval of door to door service and not curbside- I will pickup and fax to [employer]  Consider participating in activities outside the home- [facility],etc Follow up with [employer] program for in-home help/possibly rides too- maybe they can help with the cleaning of walls(smoke) Work on the decluttering goal we discussed- 1bag/box on street for bulk pickup trash day Continue to be productive  Follow up visits planned for mammorgram, ophthalmologist, pulmonary and vascular-          SDOH assessments and interventions completed:  Yes     Care Coordination Interventions:  Yes, provided  Interventions Today    Flowsheet Row Most Recent Value  General Interventions   General  Interventions Discussed/Reviewed Level of Care  Level of Care Skilled Nursing Facility  [Pt has [facility] to [name] for [facility]hab s/p hip fx]       Follow up plan: Follow up call scheduled for 10/28/22    Encounter Outcome:  Patient Visit Completed

## 2022-10-27 ENCOUNTER — Ambulatory Visit: Payer: PPO

## 2022-10-27 ENCOUNTER — Ambulatory Visit: Payer: Self-pay | Admitting: *Deleted

## 2022-10-27 ENCOUNTER — Encounter (HOSPITAL_COMMUNITY): Payer: PPO

## 2022-10-27 DIAGNOSIS — Z7901 Long term (current) use of anticoagulants: Secondary | ICD-10-CM | POA: Diagnosis not present

## 2022-10-27 DIAGNOSIS — G3184 Mild cognitive impairment, so stated: Secondary | ICD-10-CM | POA: Diagnosis not present

## 2022-10-27 DIAGNOSIS — J449 Chronic obstructive pulmonary disease, unspecified: Secondary | ICD-10-CM | POA: Diagnosis not present

## 2022-10-27 DIAGNOSIS — D509 Iron deficiency anemia, unspecified: Secondary | ICD-10-CM | POA: Diagnosis not present

## 2022-10-27 DIAGNOSIS — I5089 Other heart failure: Secondary | ICD-10-CM | POA: Diagnosis not present

## 2022-10-27 DIAGNOSIS — I480 Paroxysmal atrial fibrillation: Secondary | ICD-10-CM | POA: Diagnosis not present

## 2022-10-27 DIAGNOSIS — G9340 Encephalopathy, unspecified: Secondary | ICD-10-CM | POA: Diagnosis not present

## 2022-10-27 DIAGNOSIS — I10 Essential (primary) hypertension: Secondary | ICD-10-CM | POA: Diagnosis not present

## 2022-10-27 NOTE — Patient Outreach (Signed)
Care Coordination   Follow Up Visit Note   10/27/2022 Name: [name] MRN: [id number] DOB: 04-Jun-1950  [name] is a 72 y.o. year old female who sees [name], [name], MD for primary care. I spoke with  [name] by phone today.  What matters to the patients health and wellness today?  At [facility] SN[facility] rehab.     Goals Addressed             This Visit's Progress    Provide support, resources and mental health support       Activities and task to complete in order to accomplish goals.    Continue with rehab at [facility]    Continue working with the [facility] team on plans to move forward with home repairs/adaptations to be made- make CHS aware of questions/needs Call 988 anytime 24/7 for mental health crisis support Continue with positive self talk Try listening to music/tv, coloring or other activities to help distract you from feelings of worry, sadness, etc. Call insurance Concierge and inquire about copay for counseling- I am happy to help connect you if agreeable- Ask PCP about SCAT application at your visit this week-for hopeful approval of door to door service and not curbside- I will pickup and fax to [employer]  Consider participating in activities outside the home- [facility],etc Consider follow up with [employer] program for in-home help/possibly rides too- maybe they can help with the cleaning of walls(smoke) Work on the decluttering goal we discussed- 1bag/box on street for bulk pickup trash day Continue to be productive  Follow up visits planned for mammorgram, ophthalmologist, pulmonary and vascular-          SDOH assessments and interventions completed:  Yes     Care Coordination Interventions:  Yes, provided  Interventions Today    Flowsheet Row Most Recent Value  Chronic Disease   Chronic disease during today's visit Other, Diabetes  [s/p hip fx]  General Interventions    General Interventions Discussed/Reviewed Community Resources  Level of Care Skilled Nursing Facility       Follow up plan: Follow up call scheduled for 11/04/22    Encounter Outcome:  Patient Visit Completed

## 2022-10-29 ENCOUNTER — Other Ambulatory Visit: Payer: Self-pay | Admitting: Family Medicine

## 2022-10-29 DIAGNOSIS — I10 Essential (primary) hypertension: Secondary | ICD-10-CM | POA: Diagnosis not present

## 2022-10-29 DIAGNOSIS — G4733 Obstructive sleep apnea (adult) (pediatric): Secondary | ICD-10-CM | POA: Diagnosis not present

## 2022-10-29 DIAGNOSIS — E1122 Type 2 diabetes mellitus with diabetic chronic kidney disease: Secondary | ICD-10-CM | POA: Diagnosis not present

## 2022-10-29 DIAGNOSIS — E785 Hyperlipidemia, unspecified: Secondary | ICD-10-CM | POA: Diagnosis not present

## 2022-10-30 DIAGNOSIS — Z7901 Long term (current) use of anticoagulants: Secondary | ICD-10-CM | POA: Diagnosis not present

## 2022-10-30 DIAGNOSIS — G3184 Mild cognitive impairment, so stated: Secondary | ICD-10-CM | POA: Diagnosis not present

## 2022-10-30 DIAGNOSIS — S72002D Fracture of unspecified part of neck of left femur, subsequent encounter for closed fracture with routine healing: Secondary | ICD-10-CM | POA: Diagnosis not present

## 2022-10-30 DIAGNOSIS — D509 Iron deficiency anemia, unspecified: Secondary | ICD-10-CM | POA: Diagnosis not present

## 2022-10-30 DIAGNOSIS — I480 Paroxysmal atrial fibrillation: Secondary | ICD-10-CM | POA: Diagnosis not present

## 2022-10-30 DIAGNOSIS — I10 Essential (primary) hypertension: Secondary | ICD-10-CM | POA: Diagnosis not present

## 2022-10-30 DIAGNOSIS — G9340 Encephalopathy, unspecified: Secondary | ICD-10-CM | POA: Diagnosis not present

## 2022-10-30 DIAGNOSIS — I5089 Other heart failure: Secondary | ICD-10-CM | POA: Diagnosis not present

## 2022-10-30 DIAGNOSIS — J449 Chronic obstructive pulmonary disease, unspecified: Secondary | ICD-10-CM | POA: Diagnosis not present

## 2022-10-31 DIAGNOSIS — G9340 Encephalopathy, unspecified: Secondary | ICD-10-CM | POA: Diagnosis not present

## 2022-10-31 DIAGNOSIS — I10 Essential (primary) hypertension: Secondary | ICD-10-CM | POA: Diagnosis not present

## 2022-10-31 DIAGNOSIS — Z7901 Long term (current) use of anticoagulants: Secondary | ICD-10-CM | POA: Diagnosis not present

## 2022-10-31 DIAGNOSIS — J449 Chronic obstructive pulmonary disease, unspecified: Secondary | ICD-10-CM | POA: Diagnosis not present

## 2022-10-31 DIAGNOSIS — I480 Paroxysmal atrial fibrillation: Secondary | ICD-10-CM | POA: Diagnosis not present

## 2022-10-31 DIAGNOSIS — I5089 Other heart failure: Secondary | ICD-10-CM | POA: Diagnosis not present

## 2022-10-31 DIAGNOSIS — D509 Iron deficiency anemia, unspecified: Secondary | ICD-10-CM | POA: Diagnosis not present

## 2022-10-31 DIAGNOSIS — G3184 Mild cognitive impairment, so stated: Secondary | ICD-10-CM | POA: Diagnosis not present

## 2022-10-31 IMAGING — MR MR ABDOMEN WO/W CM
19 of 21 series · 46 of 48 positions shown · IV contrast (GADAVIST)
Comparison: Abdominal MRI 06/25/2017.

CLINICAL DATA: 70-year-old female with history of prior left-sided
renal cell carcinoma status post cryoablation. Follow-up study.

EXAM:
MRI ABDOMEN WITHOUT AND WITH CONTRAST
TECHNIQUE: Multiplanar multisequence MR imaging of the abdomen was performed
both before and after the administration of intravenous contrast.
CONTRAST:  7mL GADAVIST GADOBUTROL 1 MMOL/ML IV SOLN

[Series 3: T2 · coronal · 6.0mm · 1.56mm/px · 1 of 33 slices shown (1 of 2)]
[im 1/33]
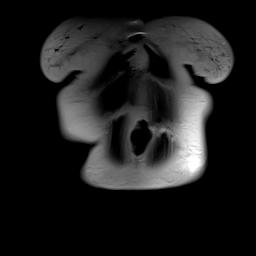

[Series 4: T2 fat-sat · 1 of 6 slices shown (1 of 2)]
[im 1/6]
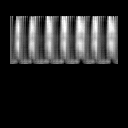

[Series 5: T2 fat-sat · axial · 6.0mm · 1.25mm/px · 1 of 36 slices shown (2 of 2)]
[im 1/36]
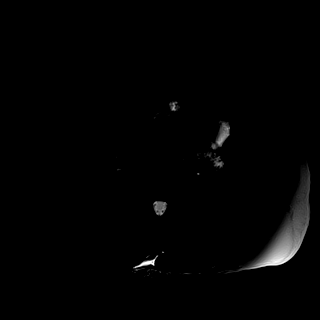

[Series 6: T1 · axial · 3.0mm · 1.38mm/px · z∈[-82,+155]mm · 2 of 80 slices shown (1 of 2)]
[im 1/80]
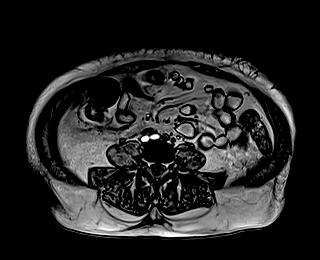
[im 80/80]
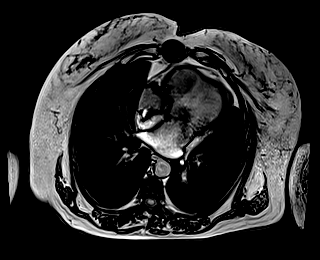

[Series 7: T1 · axial · 3.0mm · 1.38mm/px · z∈[-82,+155]mm · 3 of 80 slices shown (2 of 2)]
[im 1/80]
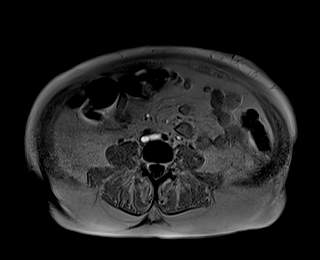
[im 40/80]
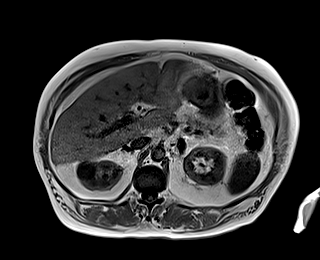
[im 80/80]
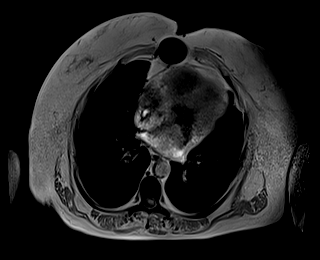

[Series 8: DWI · axial · 6.0mm · 1.64mm/px · z∈[-90,+162]mm · 3 of 72 slices shown (1 of 2)]
[im 1/72]
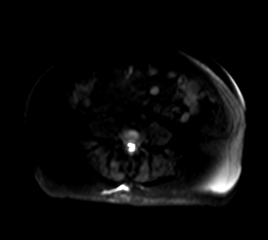
[im 36/72]
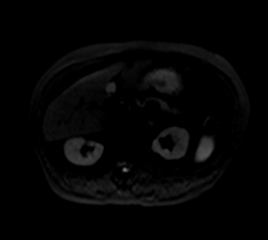
[im 72/72]
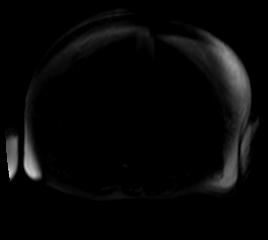

[Series 9: DWI · axial · 6.0mm · 1.64mm/px · 1 of 36 slices shown (2 of 2)]
[im 1/36]
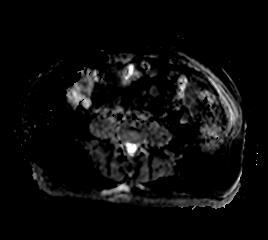

[Series 10: bSSFP · axial · 5.0mm · 0.86mm/px · z∈[-79,+152]mm · 2 of 43 slices shown]
[im 1/43]
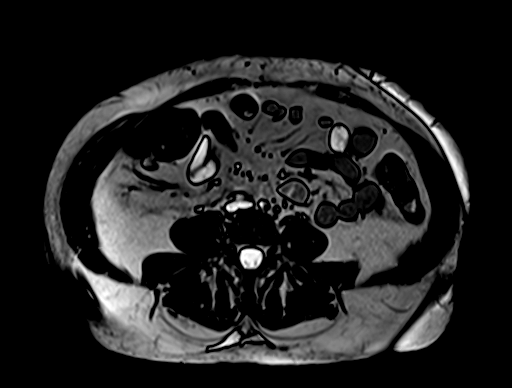
[im 43/43]
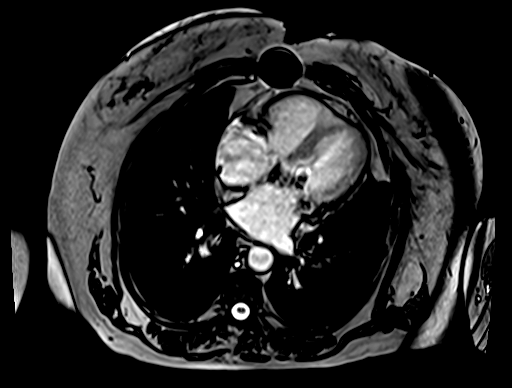

[Series 12: T1 dynamic · axial · 3.0mm · 1.38mm/px · z∈[-88,+149]mm · 3 of 80 slices shown (1 of 10)]
[im 1/80]
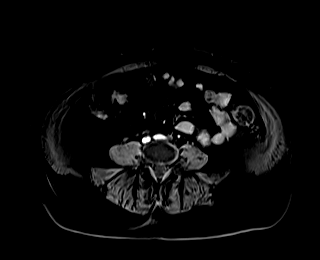
[im 40/80]
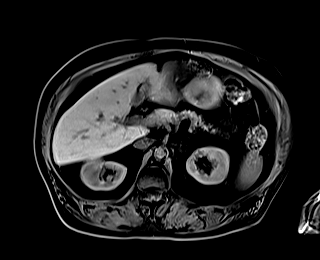
[im 80/80]
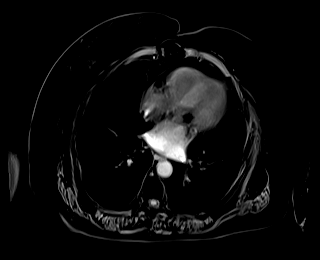

[Series 16: T1 dynamic · axial · 3.0mm · 1.38mm/px · z∈[-88,+149]mm · 3 of 80 slices shown (2 of 10)]
[im 1/80]
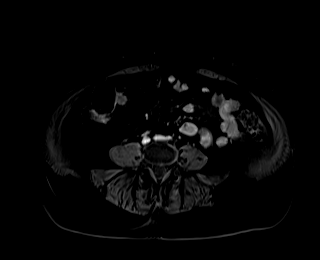
[im 40/80]
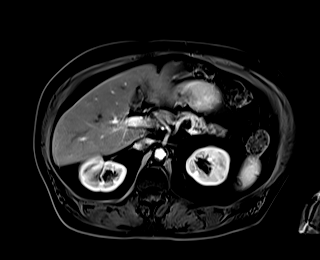
[im 80/80]
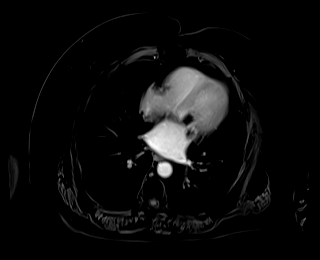

[Series 17: T1 dynamic · axial · 3.0mm · 1.38mm/px · z∈[-88,+149]mm · 3 of 80 slices shown (3 of 10)]
[im 1/80]
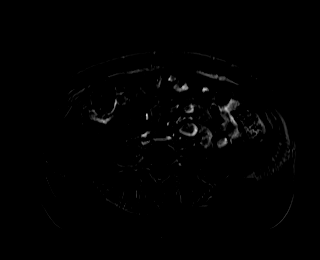
[im 40/80]
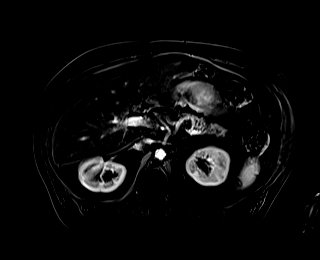
[im 80/80]
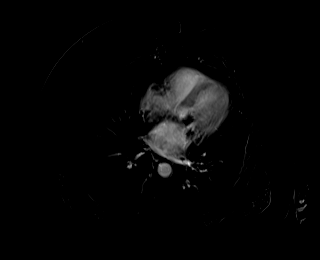

[Series 20: T1 dynamic · axial · 3.0mm · 1.38mm/px · z∈[-88,+149]mm · 3 of 80 slices shown (4 of 10)]
[im 1/80]
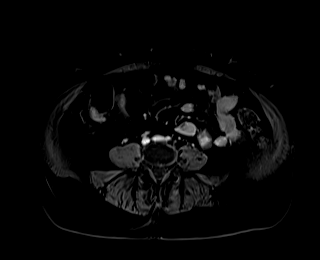
[im 40/80]
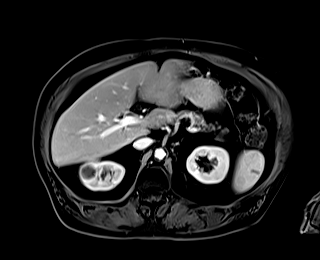
[im 80/80]
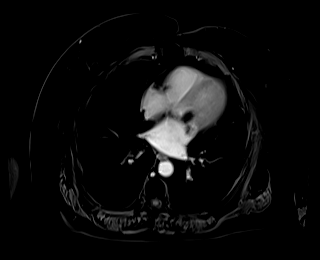

[Series 21: T1 dynamic · axial · 3.0mm · 1.38mm/px · z∈[-88,+149]mm · 3 of 80 slices shown (5 of 10)]
[im 1/80]
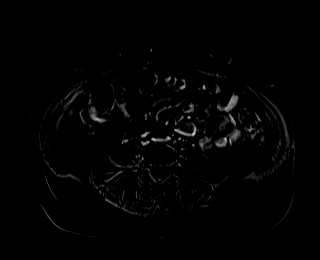
[im 40/80]
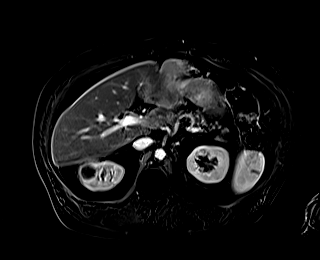
[im 80/80]
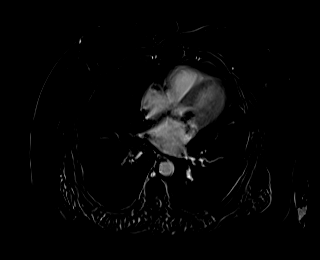

[Series 24: T1 dynamic · axial · 3.0mm · 1.38mm/px · z∈[-88,+149]mm · 3 of 80 slices shown (6 of 10)]
[im 1/80]
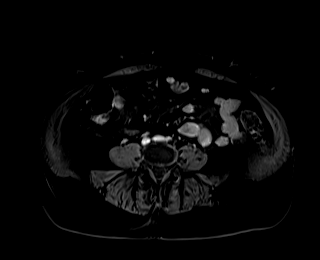
[im 40/80]
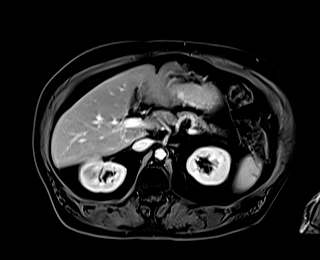
[im 80/80]
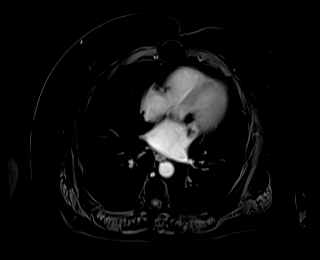

[Series 25: T1 dynamic · axial · 3.0mm · 1.38mm/px · z∈[-88,+149]mm · 3 of 80 slices shown (7 of 10)]
[im 1/80]
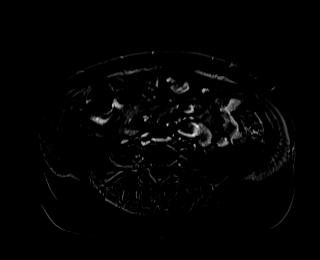
[im 40/80]
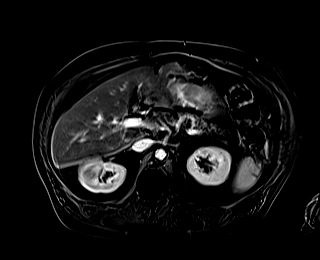
[im 80/80]
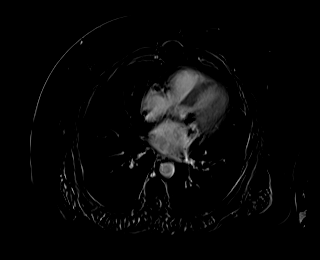

[Series 27: T1 dynamic · coronal · 3.0mm · 1.41mm/px · 3 of 88 slices shown (8 of 10)]
[im 1/88]
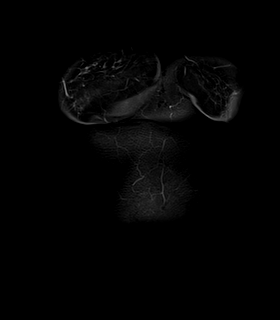
[im 44/88]
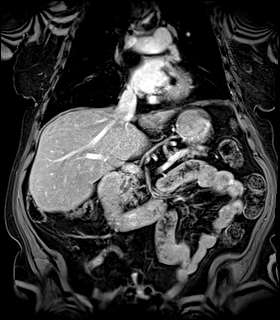
[im 88/88]
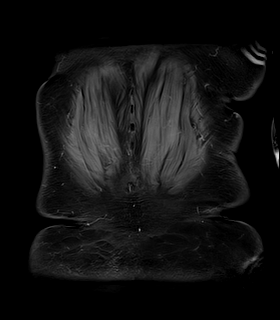

[Series 28: T2 · axial · 6.0mm · 1.72mm/px · z∈[-115,+188]mm · 2 of 43 slices shown (2 of 2)]
[im 1/43]
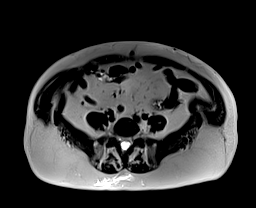
[im 43/43]
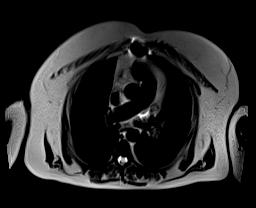

[Series 31: T1 dynamic · axial · 3.0mm · 1.38mm/px · z∈[-88,+149]mm · 3 of 80 slices shown (9 of 10)]
[im 1/80]
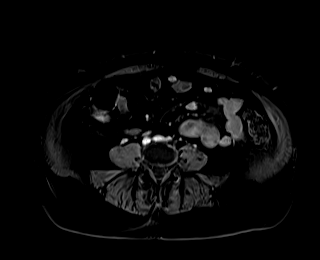
[im 40/80]
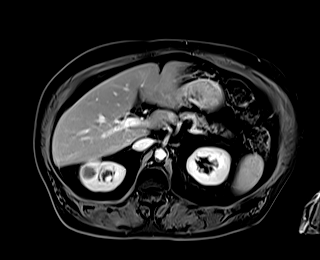
[im 80/80]
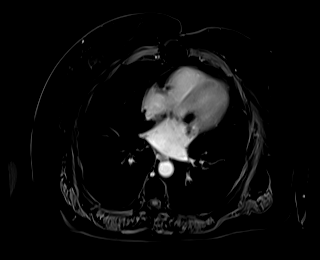

[Series 32: T1 dynamic · axial · 3.0mm · 1.38mm/px · z∈[-88,+149]mm · 3 of 80 slices shown (10 of 10)]
[im 1/80]
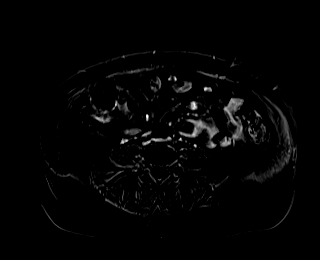
[im 40/80]
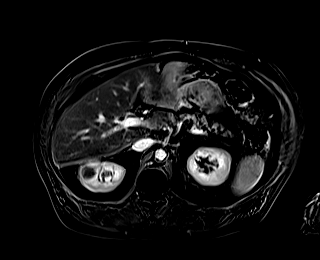
[im 80/80]
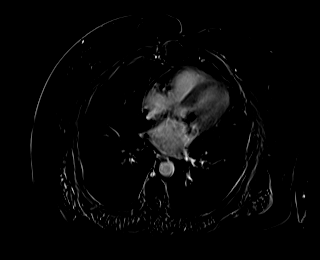

[46 of 48 positions shown; findings below may reference images not displayed]

FINDINGS: Lower chest: Susceptibility artifact in the region of the lower
sternum, presumably from median sternotomy wires.

Hepatobiliary: No suspicious cystic or solid hepatic lesions. No
intra or extrahepatic biliary ductal dilatation. Tiny filling
defects are noted lying dependently in the gallbladder. Gallbladder
is not distended. Gallbladder wall is normal in thickness. No
pericholecystic fluid.

Pancreas: No pancreatic mass. No pancreatic ductal dilatation. No
pancreatic or peripancreatic fluid collections or inflammatory
changes.

Spleen:  Unremarkable.

Adrenals/Urinary Tract: Previously noted ablation site in the
posterior aspect of the lower pole of the left kidney is similar in
appearance to the prior study demonstrating heterogeneous signal
intensity on T1 and T2 weighted images, without definite internal
enhancement on post gadolinium imaging. In the upper pole of the
left kidney (axial image 20 of series 28 and coronal image 24 of
series 3) there is a well-defined 1.4 x 1.4 x 1.4 cm lesion which is
generally T1 hypointense and T2 hyperintense, although there is a
central focus of T2 hypointensity which demonstrates enhancement on
post gadolinium imaging (best appreciated on axial image 35 of
series 20), concerning for small renal cell carcinoma. This is
encapsulated within Gerota's fascia and is well separated from the
left renal vein which is widely patent at this time. Several other
lesions are noted in the kidneys bilaterally which are T1
hypointense, T2 hyperintense, and do not enhance, compatible with
simple cysts, largest of which is an exophytic lesion in the upper
pole of the right kidney measuring 2.9 cm in diameter. No
hydroureteronephrosis in the visualized portions of the abdomen.
Bilateral adrenal glands are normal in appearance.

Stomach/Bowel: Visualized portions are unremarkable.

Vascular/Lymphatic: Extensive aortic atherosclerosis. No aneurysm
identified in the visualized abdominal vasculature. No
lymphadenopathy noted in the abdomen.

Other: No significant volume of ascites noted in the visualized
portions of the peritoneal cavity.

Musculoskeletal: No aggressive appearing osseous lesions are noted
in the visualized portions of the skeleton.
IMPRESSION: 1. Stable appearance of cryo ablation site in the lower pole of the
left kidney, with no evidence of local recurrence of disease.
2. However, there is a small lesion in the upper pole of the left
kidney measuring 1.4 cm in diameter which has imaging
characteristics concerning for a small renal cell carcinoma, as
detailed above.
3. Cholelithiasis without evidence of acute cholecystitis.
4. Aortic atherosclerosis.

## 2022-11-01 ENCOUNTER — Other Ambulatory Visit (HOSPITAL_COMMUNITY): Payer: Self-pay

## 2022-11-05 ENCOUNTER — Inpatient Hospital Stay: Payer: PPO | Attending: Hematology and Oncology | Admitting: Hematology and Oncology

## 2022-11-05 ENCOUNTER — Inpatient Hospital Stay: Payer: PPO

## 2022-11-05 DIAGNOSIS — E1122 Type 2 diabetes mellitus with diabetic chronic kidney disease: Secondary | ICD-10-CM | POA: Diagnosis not present

## 2022-11-05 DIAGNOSIS — I10 Essential (primary) hypertension: Secondary | ICD-10-CM | POA: Diagnosis not present

## 2022-11-05 DIAGNOSIS — G4733 Obstructive sleep apnea (adult) (pediatric): Secondary | ICD-10-CM | POA: Diagnosis not present

## 2022-11-05 DIAGNOSIS — E785 Hyperlipidemia, unspecified: Secondary | ICD-10-CM | POA: Diagnosis not present

## 2022-11-11 ENCOUNTER — Encounter: Payer: PPO | Admitting: Internal Medicine

## 2022-11-12 ENCOUNTER — Ambulatory Visit: Payer: Self-pay | Admitting: *Deleted

## 2022-11-12 NOTE — Patient Outreach (Signed)
Care Coordination   Follow Up Visit Note   11/12/2022 Name: Rosalee Banis MRN: [id number] DOB: 16-Aug-1950  Phill Myron Pendergrast is a 72 y.o. year old female who sees Eloisa Northern, MD for primary care. I  spoke with pt's sister, Meriam Sprague.  What matters to the patients health and wellness today?  Pt's sister reached out to CSW to share that pt may be released from [facility] Friday and concerns about pt's ability to be alone, safe, etc.    Goals Addressed             This Visit's Progress    Provide support, resources and mental health support       Activities and task to complete in order to accomplish goals.    Communicate with SW/Discharge PLanner at [facility] SNF  regarding d/c plans, options, etc  Continue working with the A[facility] team on plans to move forward with home repairs/adaptations to be made- make CHS aware of questions/needs Call 988 anytime 24/7 for mental health crisis support Continue with positive self talk Try listening to music/tv, coloring or other activities to help distract you from feelings of worry, sadness, etc. Call insurance Concierge and inquire about copay for counseling- I am happy to help connect you if agreeable- Ask PCP about SCAT application at your visit this week-for hopeful approval of door to door service and not curbside- I will pickup and fax to [employer]  Consider participating in activities outside the home- [facility],etc Consider follow up with [employer] program for in-home help/possibly rides too- maybe they can help with the cleaning of walls(smoke) Work on the decluttering goal we discussed- 1bag/box on street for bulk pickup trash day Continue to be productive  Follow up visits planned for mammorgram, ophthalmologist, pulmonary and vascular-          SDOH assessments and interventions completed:  Yes     Care Coordination Interventions:  Yes, provided  Interventions Today    Flowsheet Row Most  Recent Value  Chronic Disease   Chronic disease during today's visit Other  [at SNF for rehab]  General Interventions   General Interventions Discussed/Reviewed [employer], Level of Care, Durable Medical Equipment (DME)  [CSW discussed the [employer] program through [facility] (max of 30 hrs per year) for some help at home as well as the options for private pay caregivers, extended SNF stay ($240 day copay which pt has told sister she cannot pay) as well  as possible ALF.]  Durable Medical Equipment (DME) Wheelchair  [Sister is seeking a wheelchair that will work in pt's home,  in anticipation of possible SNF d/c soon. CSW suggested she reach out to the SNF rehab team to inquire about their recommendations and what they are able to assist with getting ordered]  Level of Care Skilled Nursing Facility, Assisted Living  [Pt remains in SNF rehab- per sister she is scheduled to be released this Friday- encouraged sister to connect with SNF SW/Discharge Planner to discuss concerns, options, plans,etc. CSW advised sister of appeal process also.]  Education Interventions   Education Provided Provided Education  Provided Verbal Education On [employer], [employer], Other, Mental Health/Coping with Illness, Applications  [CSW discussed appeal process at SNF, options for when/if pt is released to home and needs help: private pay care, [employer], HH, etc]  Mental Health Interventions   Mental Health Discussed/Reviewed Coping Strategies       Follow up plan: Follow up call scheduled for 11/13/22  Encounter Outcome:  Patient Visit Completed

## 2022-11-12 NOTE — Patient Instructions (Signed)
Visit Information  Thank you for taking time to visit with me today. Please don't hesitate to contact me if I can be of assistance to you.   Following are the goals we discussed today:   Goals Addressed             This Visit's Progress    Provide support, resources and mental health support       Activities and task to complete in order to accomplish goals.    Communicate with SW/Discharge PLanner at [facility] SN[facility]  regarding d/c plans, options, etc  Continue working with the [facility] team on plans to move forward with home repairs/adaptations to be made- make CHS aware of questions/needs Call 988 anytime 24/7 for mental health crisis support Continue with positive self talk Try listening to music/tv, coloring or other activities to help distract you from feelings of worry, sadness, etc. Call insurance Concierge and inquire about copay for counseling- I am happy to help connect you if agreeable- Ask PCP about SCAT application at your visit this week-for hopeful approval of door to door service and not curbside- I will pickup and fax to [employer]  Consider participating in activities outside the home- [facility],etc Consider follow up with [employer] program for in-home help/possibly rides too- maybe they can help with the cleaning of walls(smoke) Work on the decluttering goal we discussed- 1bag/box on street for bulk pickup trash day Continue to be productive  Follow up visits planned for [facility], ophthalmologist, pulmonary and vascular-          Our next appointment is by telephone on 11/13/22   Please call the care guide team at [phone] if you need to cancel or reschedule your appointment.   If you are experiencing a Mental Health or Behavioral Health Crisis or need someone to talk to, please call the Suicide and Crisis Lifeline: 988 call 911   The patient verbalized understanding of instructions, educational materials, and care plan  provided today and DECLINED offer to receive copy of patient instructions, educational materials, and care plan.   Telephone follow up appointment with care management team member scheduled for:  Reece Levy, MSW, LCSW Clinical Social Worker [phone]

## 2022-11-13 ENCOUNTER — Ambulatory Visit: Payer: Self-pay | Admitting: *Deleted

## 2022-11-13 NOTE — Patient Outreach (Signed)
Care Coordination   Follow Up Visit Note   11/13/2022 Name: [name] MRN: [id number] DOB: 1950/09/15  [name] is a 72 y.o. year old female who sees Eloisa Northern, MD for primary care. I  spoke with pt's sister, Meriam Sprague.  What matters to the patients health and wellness today?  Sister unsure of d/c plans- awaiting word from SNF discharge planner.     Goals Addressed             This Visit's Progress    Provide support, resources and mental health support       Activities and task to complete in order to accomplish goals.    Communicate with SW/Discharge PLanner at [facility]  regarding d/c plans, options, etc  Continue working with the [facility] team on plans to move forward with home repairs/adaptations to be made- make CHS aware of questions/needs Try listening to music/tv, coloring or other activities to help distract you from feelings of worry, sadness, etc. Call insurance Concierge and inquire about copay for counseling- I am happy to help connect you if agreeable- Ask PCP about SCAT application at your visit this week-for hopeful approval of door to door service and not curbside- I will pickup and fax to [facility]  Consider participating in activities outside the home- [facility],etc Consider follow up with [employer] program for in-home help/possibly rides too- maybe they can help with the cleaning of walls(smoke) Work on the decluttering goal we discussed- 1bag/box on street for bulk pickup trash day Continue to be productive  Follow up visits planned for mammorgram, ophthalmologist, pulmonary and vascular-          SDOH assessments and interventions completed:  Yes     Care Coordination Interventions:  Yes, provided  Interventions Today    Flowsheet Row Most Recent Value  General Interventions   Level of Care Skilled Nursing Facility  [Pt is at SNF rehab- CSW left message for SNF Discharge Planner for  updates]       Follow up plan: Follow up call scheduled for 11/17/22    Encounter Outcome:  Patient Visit Completed

## 2022-11-13 NOTE — Patient Instructions (Signed)
Visit Information  Thank you for taking time to visit with me today. Please don't hesitate to contact me if I can be of assistance to you.   Following are the goals we discussed today:   Goals Addressed             This Visit's Progress    Provide support, resources and mental health support       Activities and task to complete in order to accomplish goals.    Communicate with SW/Discharge PLanner at [facility]  regarding d/c plans, options, etc  Continue working with the Aging Gracefully[facility] team on plans to move forward with home repairs/adaptations to be made- make CHS aware of questions/needs Try listening to music/tv, coloring or other activities to help distract you from feelings of worry, sadness, etc. Call insurance Concierge and inquire about copay for counseling- I am happy to help connect you if agreeable- Ask PCP about SCAT application at your visit this week-for hopeful approval of door to door service and not curbside- I will pickup and fax to [employer]  Consider participating in activities outside the home- [facility],etc Consider follow up with [employer] program for in-home help/possibly rides too- maybe they can help with the cleaning of walls(smoke) Work on the decluttering goal we discussed- 1bag/box on street for bulk pickup trash day Continue to be productive  Follow up visits planned for [facility], ophthalmologist, pulmonary and vascular-          Our next appointment is by telephone on 11/17/22    Please call the care guide team at [phone] if you need to cancel or reschedule your appointment.   If you are experiencing a Mental Health or Behavioral Health Crisis or need someone to talk to, please call the Suicide and Crisis Lifeline: 988 call 911   The patient verbalized understanding of instructions, educational materials, and care plan provided today and DECLINED offer to receive copy of patient instructions, educational  materials, and care plan.   Telephone follow up appointment with care management team member scheduled for:11/17/22  Reece Levy, MSW, LCSW Clinical Social Worker [phone]

## 2022-11-14 DIAGNOSIS — I480 Paroxysmal atrial fibrillation: Secondary | ICD-10-CM | POA: Diagnosis not present

## 2022-11-14 DIAGNOSIS — G3184 Mild cognitive impairment, so stated: Secondary | ICD-10-CM | POA: Diagnosis not present

## 2022-11-14 DIAGNOSIS — I10 Essential (primary) hypertension: Secondary | ICD-10-CM | POA: Diagnosis not present

## 2022-11-14 DIAGNOSIS — G9340 Encephalopathy, unspecified: Secondary | ICD-10-CM | POA: Diagnosis not present

## 2022-11-14 DIAGNOSIS — D509 Iron deficiency anemia, unspecified: Secondary | ICD-10-CM | POA: Diagnosis not present

## 2022-11-14 DIAGNOSIS — Z7901 Long term (current) use of anticoagulants: Secondary | ICD-10-CM | POA: Diagnosis not present

## 2022-11-14 DIAGNOSIS — S72002S Fracture of unspecified part of neck of left femur, sequela: Secondary | ICD-10-CM | POA: Diagnosis not present

## 2022-11-14 DIAGNOSIS — I5089 Other heart failure: Secondary | ICD-10-CM | POA: Diagnosis not present

## 2022-11-14 DIAGNOSIS — J449 Chronic obstructive pulmonary disease, unspecified: Secondary | ICD-10-CM | POA: Diagnosis not present

## 2022-11-16 ENCOUNTER — Encounter (HOSPITAL_COMMUNITY): Payer: Self-pay | Admitting: Emergency Medicine

## 2022-11-16 ENCOUNTER — Other Ambulatory Visit: Payer: Self-pay

## 2022-11-16 ENCOUNTER — Emergency Department (HOSPITAL_COMMUNITY): Payer: PPO

## 2022-11-16 ENCOUNTER — Emergency Department (HOSPITAL_COMMUNITY)
Admission: EM | Admit: 2022-11-16 | Discharge: 2022-11-16 | Disposition: A | Discharge status: Home / Self Care | Payer: PPO | Attending: Emergency Medicine | Admitting: Emergency Medicine

## 2022-11-16 DIAGNOSIS — Z7984 Long term (current) use of oral hypoglycemic drugs: Secondary | ICD-10-CM | POA: Diagnosis not present

## 2022-11-16 DIAGNOSIS — E1122 Type 2 diabetes mellitus with diabetic chronic kidney disease: Secondary | ICD-10-CM | POA: Diagnosis not present

## 2022-11-16 DIAGNOSIS — G4489 Other headache syndrome: Secondary | ICD-10-CM | POA: Diagnosis not present

## 2022-11-16 DIAGNOSIS — W19XXXA Unspecified fall, initial encounter: Secondary | ICD-10-CM | POA: Diagnosis not present

## 2022-11-16 DIAGNOSIS — S52501A Unspecified fracture of the lower end of right radius, initial encounter for closed fracture: Secondary | ICD-10-CM

## 2022-11-16 DIAGNOSIS — S72142D Displaced intertrochanteric fracture of left femur, subsequent encounter for closed fracture with routine healing: Secondary | ICD-10-CM | POA: Diagnosis not present

## 2022-11-16 DIAGNOSIS — N1831 Chronic kidney disease, stage 3a: Secondary | ICD-10-CM | POA: Diagnosis not present

## 2022-11-16 DIAGNOSIS — S60211A Contusion of right wrist, initial encounter: Secondary | ICD-10-CM | POA: Diagnosis not present

## 2022-11-16 DIAGNOSIS — S0512XA Contusion of eyeball and orbital tissues, left eye, initial encounter: Secondary | ICD-10-CM | POA: Diagnosis not present

## 2022-11-16 DIAGNOSIS — I2510 Atherosclerotic heart disease of native coronary artery without angina pectoris: Secondary | ICD-10-CM | POA: Insufficient documentation

## 2022-11-16 DIAGNOSIS — I700 Atherosclerosis of aorta: Secondary | ICD-10-CM | POA: Diagnosis not present

## 2022-11-16 DIAGNOSIS — Z794 Long term (current) use of insulin: Secondary | ICD-10-CM | POA: Diagnosis not present

## 2022-11-16 DIAGNOSIS — Z7901 Long term (current) use of anticoagulants: Secondary | ICD-10-CM | POA: Diagnosis not present

## 2022-11-16 DIAGNOSIS — S72009A Fracture of unspecified part of neck of unspecified femur, initial encounter for closed fracture: Secondary | ICD-10-CM | POA: Diagnosis not present

## 2022-11-16 DIAGNOSIS — R739 Hyperglycemia, unspecified: Secondary | ICD-10-CM | POA: Diagnosis not present

## 2022-11-16 DIAGNOSIS — J449 Chronic obstructive pulmonary disease, unspecified: Secondary | ICD-10-CM | POA: Insufficient documentation

## 2022-11-16 DIAGNOSIS — Z7401 Bed confinement status: Secondary | ICD-10-CM | POA: Diagnosis not present

## 2022-11-16 DIAGNOSIS — S0993XA Unspecified injury of face, initial encounter: Secondary | ICD-10-CM | POA: Diagnosis not present

## 2022-11-16 DIAGNOSIS — I509 Heart failure, unspecified: Secondary | ICD-10-CM | POA: Diagnosis not present

## 2022-11-16 DIAGNOSIS — S2241XA Multiple fractures of ribs, right side, initial encounter for closed fracture: Secondary | ICD-10-CM | POA: Diagnosis not present

## 2022-11-16 DIAGNOSIS — M1811 Unilateral primary osteoarthritis of first carpometacarpal joint, right hand: Secondary | ICD-10-CM | POA: Diagnosis not present

## 2022-11-16 DIAGNOSIS — R519 Headache, unspecified: Secondary | ICD-10-CM | POA: Diagnosis not present

## 2022-11-16 DIAGNOSIS — R9431 Abnormal electrocardiogram [ECG] [EKG]: Secondary | ICD-10-CM | POA: Diagnosis not present

## 2022-11-16 DIAGNOSIS — S0011XA Contusion of right eyelid and periocular area, initial encounter: Secondary | ICD-10-CM | POA: Diagnosis not present

## 2022-11-16 DIAGNOSIS — S199XXA Unspecified injury of neck, initial encounter: Secondary | ICD-10-CM | POA: Diagnosis not present

## 2022-11-16 DIAGNOSIS — S59919A Unspecified injury of unspecified forearm, initial encounter: Secondary | ICD-10-CM | POA: Diagnosis not present

## 2022-11-16 DIAGNOSIS — S52591A Other fractures of lower end of right radius, initial encounter for closed fracture: Secondary | ICD-10-CM | POA: Diagnosis not present

## 2022-11-16 DIAGNOSIS — H05232 Hemorrhage of left orbit: Secondary | ICD-10-CM

## 2022-11-16 DIAGNOSIS — S0990XA Unspecified injury of head, initial encounter: Secondary | ICD-10-CM | POA: Diagnosis not present

## 2022-11-16 DIAGNOSIS — I6782 Cerebral ischemia: Secondary | ICD-10-CM | POA: Diagnosis not present

## 2022-11-16 DIAGNOSIS — S52611A Displaced fracture of right ulna styloid process, initial encounter for closed fracture: Secondary | ICD-10-CM | POA: Diagnosis not present

## 2022-11-16 DIAGNOSIS — R0902 Hypoxemia: Secondary | ICD-10-CM | POA: Diagnosis not present

## 2022-11-16 LAB — CBC WITH DIFFERENTIAL/PLATELET
Abs Immature Granulocytes: 0.01 10*3/uL (ref 0.00–0.07)
Basophils Absolute: 0 10*3/uL (ref 0.0–0.1)
Basophils Relative: 1 %
Eosinophils Absolute: 0.1 10*3/uL (ref 0.0–0.5)
Eosinophils Relative: 1 %
HCT: 33.4 % — ABNORMAL LOW (ref 36.0–46.0)
Hemoglobin: 9.5 g/dL — ABNORMAL LOW (ref 12.0–15.0)
Immature Granulocytes: 0 %
Lymphocytes Relative: 21 %
Lymphs Abs: 1 10*3/uL (ref 0.7–4.0)
MCH: 24.9 pg — ABNORMAL LOW (ref 26.0–34.0)
MCHC: 28.4 g/dL — ABNORMAL LOW (ref 30.0–36.0)
MCV: 87.4 fL (ref 80.0–100.0)
Monocytes Absolute: 0.6 10*3/uL (ref 0.1–1.0)
Monocytes Relative: 13 %
Neutro Abs: 2.9 10*3/uL (ref 1.7–7.7)
Neutrophils Relative %: 64 %
Platelets: 216 10*3/uL (ref 150–400)
RBC: 3.82 MIL/uL — ABNORMAL LOW (ref 3.87–5.11)
RDW: 20 % — ABNORMAL HIGH (ref 11.5–15.5)
WBC: 4.6 10*3/uL (ref 4.0–10.5)
nRBC: 0 % (ref 0.0–0.2)

## 2022-11-16 LAB — COMPREHENSIVE METABOLIC PANEL
ALT: 13 U/L (ref 0–44)
AST: 15 U/L (ref 15–41)
Albumin: 3.2 g/dL — ABNORMAL LOW (ref 3.5–5.0)
Alkaline Phosphatase: 91 U/L (ref 38–126)
Anion gap: 13 (ref 5–15)
BUN: 15 mg/dL (ref 8–23)
CO2: 28 mmol/L (ref 22–32)
Calcium: 9.2 mg/dL (ref 8.9–10.3)
Chloride: 95 mmol/L — ABNORMAL LOW (ref 98–111)
Creatinine, Ser: 1.13 mg/dL — ABNORMAL HIGH (ref 0.44–1.00)
GFR, Estimated: 51 mL/min — ABNORMAL LOW (ref >60)
Glucose, Bld: 197 mg/dL — ABNORMAL HIGH (ref 70–99)
Potassium: 3.9 mmol/L (ref 3.5–5.1)
Sodium: 136 mmol/L (ref 135–145)
Total Bilirubin: 0.9 mg/dL (ref 0.3–1.2)
Total Protein: 6.4 g/dL — ABNORMAL LOW (ref 6.5–8.1)

## 2022-11-16 LAB — PROTIME-INR
INR: 2.4 — ABNORMAL HIGH (ref 0.8–1.2)
Prothrombin Time: 25.9 s — ABNORMAL HIGH (ref 11.4–15.2)

## 2022-11-16 MED ORDER — HYDROCODONE-ACETAMINOPHEN 5-325 MG PO TABS
2.0000 | ORAL_TABLET | Freq: Once | ORAL | Status: AC
  Administered 2022-11-16: 2 via ORAL
  Filled 2022-11-16: qty 2

## 2022-11-16 NOTE — ED Triage Notes (Signed)
Patient presents via EMS from SNF [facility]) for unwitnessed fall. Per EMS, patient was in the bathroom, lost her balance, and fell backward resulting in headstrike. Per EMS, the staff heard a thud and immediately went into the bathroom to assess the patient and found her conscious. Patient has a right wrist fracture and right orbital bruising and swelling. Patient wasn't seen or evaluated after that fall. Per EMS, patient had a mobile wrist x-ray on Friday. Patient is AAOx4 but per EMS, facility stated that patient has periods of intermittent confusion.

## 2022-11-16 NOTE — Discharge Instructions (Addendum)
Keimora had a CT scan performed today of her head and cervical spine for fall as well as repeat x-rays of her hip and her right wrist.  She was noted to have a distal right radial fracture, likely from her fall 2 days ago.  She was placed in a splint which she will need to wear at all times.  She is not to bear weight with her right arm, meaning specifically she is not stable to brace herself on a walker using this right arm.  She will likely need another physical therapy evaluation when she returns to the subacute rehab, but at this time she does remain a fall risk.  She will also need follow-up with a hand surgeon.  An orthopedic hand surgeons information was provided.  Please call to schedule follow-up appointment.  Bonita Quin was given 10 mg of Norco for pain in the [facility] emergency department today.  She may need reevaluated for pain control when she returns to her facility as she now has a new injury with her broken right wrist.

## 2022-11-16 NOTE — ED Provider Notes (Signed)
[facility] EMERGENCY DEPARTMENT AT [facility] Provider Note   CSN: [id number] Arrival date & time: 11/16/22  1414     History  No chief complaint on file.   [name] is a 72 y.o. female.  Pt is a 72y/o female with hx of COPD, MS s/p bioprosthetic VR, atrial fibrillation on warfarin, HFpEF, CAD, chronic symptomatic IDA, CKD3a, T2DM, PAD, with frequent falls and fall in sept 24' with hip fracture requiring replacement who is presenting today with another fall at rehab and level 2 because she hit her head and is on coumadin.   Patient reports she was standing in her bathroom when she lost her balance falling backwards striking her head on the floor.  She did not have any loss of consciousness but reports on Friday she had another fall and at that time injured her right wrist and hit her face.  She was not evaluated in the emergency room after this fall.  The rehab facility reports they did do a mobile x-ray on Friday and they were told she had a wrist fracture but her wrist is not splinted.  Patient denies significant headache, vision changes, nausea or vomiting.  She denies any significant pain in her extremities except for her left hip however this was recently replaced.  She also does have pain in her right wrist.  She denies any chest pain or shortness of breath.  Patient came by EMS but they were unable to gain any access and she did not receive any medications.  The history is provided by the patient, the EMS personnel and medical records.       Home Medications Prior to Admission medications   Medication Sig Start Date End Date Taking? Authorizing Provider  albuterol (PROAIR HFA) 108 (90 Base) MCG/ACT inhaler Inhale 2 puffs into the lungs every 6 (six) hours as needed for shortness of breath. 02/12/22   Miguel Aschoff, MD  ALPRAZolam Prudy Feeler) 1 MG tablet Take 1/2 - 1 tablet (1/2  - 1 mg total) by mouth at bedtime as needed for anxiety or sleep. May take 1/2  tablet during the day if needed for anxiety 10/22/22   Hassan Rowan, [place], MD  buPROPion (WELLBUTRIN XL) 150 MG 24 hr tablet Take 1 tablet (150 mg total) by mouth every morning. 12/23/21   Miguel Aschoff, MD  cetirizine (ZYRTEC ALLERGY) 10 MG tablet Take 1 tablet (10 mg total) by mouth daily. 09/19/21 10/21/22  Miguel Aschoff, MD  cholecalciferol (VITAMIN D3) 25 MCG (1000 UNIT) tablet Take 1 tablet (1,000 Units total) by mouth daily. 10/22/22   Alexander-Savino, [place], MD  Continuous Glucose Sensor (FREESTYLE LIBRE 3 SENSOR) MISC Place 1 sensor on the skin every 14 days. Use to check glucose 6 times daily as directed 12/23/21   Miguel Aschoff, MD  docusate sodium (COLACE) 100 MG capsule Take 1 capsule (100 mg total) by mouth daily as needed for mild constipation (use daily until BM become regular). 10/22/22   Alexander-Savino, [place], MD  DULoxetine (CYMBALTA) 60 MG capsule Take 1 capsule (60 mg total) by mouth 2 (two) times daily. 03/20/22   Miguel Aschoff, MD  enoxaparin (LOVENOX) 80 MG/0.8ML injection Inject 0.7 mLs (70 mg total) into the skin every 12 (twelve) hours. 10/23/22   Alexander-Savino, [place], MD  Fluticasone-Umeclidin-Vilant (TRELEGY ELLIPTA) 100-62.5-25 MCG/ACT AEPB Inhale 1 puff into the lungs daily. 03/14/22   Waymon Budge, MD  furosemide (LASIX) 80 MG tablet Take 1 tablet (80 mg total)  by mouth every morning. 08/26/22   Miguel Aschoff, MD  gabapentin (NEURONTIN) 600 MG tablet Take 600 mg by mouth at bedtime.    [provider]  HYDROcodone-acetaminophen (NORCO/VICODIN) 5-325 MG tablet Take 2 tablets by mouth every 6 (six) hours as needed for moderate pain. 10/22/22   Alexander-Savino, [place], MD  insulin degludec (TRESIBA FLEXTOUCH) 100 UNIT/ML FlexTouch Pen Inject 44 Units into the skin daily. 06/09/22 11/27/22  Miguel Aschoff, MD  ipratropium (ATROVENT) 0.03 % nasal spray Instill 2 sprays into the nose 3 (three) times daily as  directed 06/20/22   Miguel Aschoff, MD  ipratropium-albuterol (DUONEB) 0.5-2.5 (3) MG/3ML SOLN Inhale 3 mLs by nebulization every 6 (six) hours as needed (shortness of breath, wheezing). 04/03/22   Miguel Aschoff, MD  ketoconazole (NIZORAL) 2 % cream Apply 1 application topically at bedtime as needed for irritation. 09/08/22   [name], RPH-CPP  lidocaine-prilocaine (EMLA) cream Apply 1 Application topically as needed. 09/01/22   McCaughan, Dia D, DPM  linaclotide (LINZESS) 72 MCG capsule Take 1 capsule (72 mcg total) by mouth daily before breakfast. 08/21/22   Miguel Aschoff, MD  metoprolol succinate (TOPROL XL) 25 MG 24 hr tablet Take 1 tablet (25 mg total) by mouth daily. 01/30/22   Adron Bene, MD  nicotine (NICODERM CQ - DOSED IN MG/24 HOURS) 14 mg/24hr patch Place 1 patch (14 mg total) onto the skin daily at 6 (six) AM. 10/23/22   Alexander-Savino, [place], MD  ondansetron (ZOFRAN) 4 MG tablet Take 1 tablet (4 mg total) by mouth every 8 (eight) hours as needed for nausea or vomiting. Patient not taking: Reported on 10/21/2022 12/23/21 12/23/22  Miguel Aschoff, MD  OXYGEN Inhale 2 L into the lungs at bedtime.    [provider]  polyethylene glycol (MIRALAX / GLYCOLAX) 17 g packet Take 17 g by mouth daily as needed (use daily until BM become regular). 10/22/22   Alexander-Savino, [place], MD  potassium chloride SA (KLOR-CON M) 20 MEQ tablet Take 1 tablet (20 mEq total) by mouth daily. 08/26/22   Miguel Aschoff, MD  rosuvastatin (CRESTOR) 20 MG tablet Take 1 tablet (20 mg total) by mouth at bedtime. 03/14/22 03/09/23  Miguel Aschoff, MD  Semaglutide, 1 MG/DOSE, 4 MG/3ML SOPN Inject 1 mg into the skin once a week. 05/01/22   Miguel Aschoff, MD  warfarin (COUMADIN) 2.5 MG tablet Take 1 tablet (2.5mg ) by mouth on Tuesday, Friday and Sunday. Take half-a-tablet (1.25mg ) on Monday, Wednesday, Thursday, Saturday. Or take as instructed 10/22/22   Reymundo Poll, MD      Allergies    Dexilant [dexlansoprazole], Lorazepam, Oxycontin [oxycodone], and Tramadol hcl    Review of Systems   Review of Systems  Physical Exam Updated Vital Signs BP 112/76   Temp (!) 97.4 F (36.3 C) (Oral)   Ht 5\' 2"  (1.575 m)   Wt 70.3 kg   SpO2 95%   BMI 28.35 kg/m  Physical Exam Vitals and nursing note reviewed.  Constitutional:      General: She is not in acute distress.    Appearance: She is well-developed.  HENT:     Head: Normocephalic and atraumatic.  Eyes:     Pupils: Pupils are equal, round, and reactive to light.  Cardiovascular:     Rate and Rhythm: Normal rate and regular rhythm.     Heart sounds: Normal heart sounds. No murmur heard.    No friction rub.  Pulmonary:  Effort: Pulmonary effort is normal.     Breath sounds: Normal breath sounds. No wheezing or rales.  Abdominal:     General: Bowel sounds are normal. There is no distension.     Palpations: Abdomen is soft.     Tenderness: There is no abdominal tenderness. There is no guarding or rebound.  Musculoskeletal:        General: Tenderness present. Normal range of motion.     Comments: No edema.  Healing right hip prosthesis scar.  Some tenderness with palpation but still full range of motion.  Bilateral knees and ankles are normal.  Right wrist with significant ecchymosis in various stages of healing but no obvious deformity.  Skin:    General: Skin is warm and dry.     Findings: No rash.  Neurological:     Mental Status: She is alert and oriented to person, place, and time.     Cranial Nerves: No cranial nerve deficit.  Psychiatric:        Behavior: Behavior normal.     ED Results / Procedures / Treatments   Labs (all labs ordered are listed, but only abnormal results are displayed) Labs Reviewed  CBC WITH DIFFERENTIAL/PLATELET  COMPREHENSIVE METABOLIC PANEL  PROTIME-INR    EKG None  Radiology No results found.  Procedures Procedures    Medications  Ordered in ED Medications - No data to display  ED Course/ Medical Decision Making/ A&P                                 Medical Decision Making Amount and/or Complexity of Data Reviewed Labs: ordered. Radiology: ordered.   Pt with multiple medical problems and comorbidities and presenting today with a complaint that caries a high risk for morbidity and mortality.  Here today as a level 2 trauma after a fall being on Coumadin.  Patient has had numerous falls recent hip fracture in September and a fall 2 days ago and then again today.  Patient is awake alert and oriented.  No mental status changes or concern for other underlying medical issues.  Patient has significant periorbital ecchymosis on the right from fall 2 days ago as well as on the wrist.  However vision is intact.  Baseline labs and imaging are pending.         Final Clinical Impression(s) / ED Diagnoses Final diagnoses:  None    Rx / DC Orders ED Discharge Orders     None         Gwyneth Sprout, MD 11/16/22 1502

## 2022-11-16 NOTE — ED Notes (Signed)
Called ortho tech for sugar tong splint.

## 2022-11-16 NOTE — Progress Notes (Signed)
Orthopedic Tech Progress Note Patient Details:  [name] Nov 08, 1950 [id number]  Ortho Devices Type of Ortho Device: Arm sling, Sugartong splint Ortho Device/Splint Location: rue Ortho Device/Splint Interventions: Ordered, Application, Adjustment  When I arrived to the room I introduced myself to the patient. I then explained what I was there to do. Then I tried to apply the splint the patient, the patient was trying to straighten their arm the whole time. I threw away the old splint. Then I got the patients tech to hold the arm for me and we got the splint on with no problem. Post Interventions Patient Tolerated: Well Instructions Provided: Care of device, Adjustment of device  Trinna Post 11/16/2022, 8:25 PM

## 2022-11-16 NOTE — ED Notes (Signed)
Called 3x to [employer]. No answer.

## 2022-11-16 NOTE — ED Provider Notes (Signed)
72 yo female A Fib and valve replacement on coumadin, COPD Presenting with fall,  Hx of frequent falls, balance difficulties, presenting from rehab facility Pending xray of the wrist (bruised from prior fall), pelvis, CT imaging   Physical Exam  BP (!) 156/52 (BP Location: Left Arm)   Pulse 68   Temp (!) 97.4 F (36.3 C) (Oral)   Resp 20   Ht 5\' 2"  (1.575 m)   Wt 70.3 kg   SpO2 94%   BMI 28.35 kg/m   Physical Exam  Procedures  Procedures  ED Course / MDM    Medical Decision Making Amount and/or Complexity of Data Reviewed Labs: ordered. Radiology: ordered.  Risk Prescription drug management.   Patient's home images notable for the distal radial fracture on the right side.  This fall occurred about 2 days ago per their report.  She is neurovascularly intact.  Will place her in a sugar-tong splint and she will need follow-up with an orthopedic surgeon, Dr Hulda Humphrey, who is on-call for hand surgery today, and happened to be the surgeon operated on her hip recently as well.  I relayed this information to Baldomero Lamy FNP we then applied as the facility provider at her SNF.  Patient will be discharged back to the SNF and will likely need another PT evaluation in the morning at her SNF.  I advised that she be nonweightbearing in the right hand, not use the right hand for any bracing or activities, including using her walker.  Patient was given pain medicine in the ED, norco 10 mg.  She will need PTAR for transport given her fall risk.  I also updated the patient's sister at the bedside and her daughter by phone.       Terald Sleeper, MD 11/16/22 [phone]

## 2022-11-17 ENCOUNTER — Ambulatory Visit: Payer: Self-pay | Admitting: *Deleted

## 2022-11-17 NOTE — Patient Outreach (Signed)
Care Coordination   Follow Up Visit Note   11/17/2022 Name: [name] MRN: [id number] DOB: [birth year]  [name] is a 72 y.o. year old female who sees [name], [name], MD for primary care. I spoke with  [name] by phone today.  What matters to the patients health and wellness today?  Want to go home.    Goals Addressed             This Visit's Progress    Provide support, resources and mental health support       Activities and task to complete in order to accomplish goals.    Communicate with SW/Discharge PLanner at [facility]  regarding d/c plans, options, etc  Continue working with the Aging Gracefully[facility] team on plans to move forward with home repairs/adaptations to be made- make CHS aware of questions/needs Try listening to music/tv, coloring or other activities to help distract you from feelings of worry, sadness, etc. Call insurance Concierge and inquire about copay for counseling- I am happy to help connect you if agreeable- Consider follow up with [employer] [facility] program for in-home help/possibly rides too- maybe they can help with the cleaning of walls(smoke)        SDOH assessments and interventions completed:  Yes     Care Coordination Interventions:  Yes, provided  Interventions Today    Flowsheet Row Most Recent Value  General Interventions   Level of Care Skilled Nursing Facility  [Pt and sister report 2 falls over the weekend with ER visits. Pt has been told her insurance won't cover the stay going forward and they will have a $360/day charge. Pt wants to go home. CSW reminded them of the appeal process if they choose to file-]       Follow up plan: Follow up call scheduled for 10/   9/24 Encounter Outcome:  Patient Visit Completed

## 2022-11-17 NOTE — Patient Instructions (Signed)
Visit Information  Thank you for taking time to visit with me today. Please don't hesitate to contact me if I can be of assistance to you.   Following are the goals we discussed today:   Goals Addressed             This Visit's Progress    Provide support, resources and mental health support       Activities and task to complete in order to accomplish goals.    Communicate with SW/Discharge PLanner at [facility]  regarding d/c plans, options, etc  Continue working with the Aging Gracefully[facility] team on plans to move forward with home repairs/adaptations to be made- make CHS aware of questions/needs Try listening to music/tv, coloring or other activities to help distract you from feelings of worry, sadness, etc. Call insurance Concierge and inquire about copay for counseling- I am happy to help connect you if agreeable- Consider follow up with [employer] program for in-home help/possibly rides too- maybe they can help with the cleaning of walls(smoke)        Our next appointment is by telephone on 11/19/22  Please call the care guide team at [phone] if you need to cancel or reschedule your appointment.   If you are experiencing a Mental Health or Behavioral Health Crisis or need someone to talk to, please call the Suicide and Crisis Lifeline: 988 call 911   The patient verbalized understanding of instructions, educational materials, and care plan provided today and DECLINED offer to receive copy of patient instructions, educational materials, and care plan.   Telephone follow up appointment with care management team member scheduled for: 11/19/22  Reece Levy, MSW, LCSW Clinical Social Worker [phone]

## 2022-11-19 ENCOUNTER — Emergency Department (HOSPITAL_COMMUNITY)
Admission: EM | Admit: 2022-11-19 | Discharge: 2022-11-19 | Disposition: A | Discharge status: Home / Self Care | Payer: PPO | Attending: Emergency Medicine | Admitting: Emergency Medicine

## 2022-11-19 ENCOUNTER — Ambulatory Visit: Payer: Self-pay | Admitting: *Deleted

## 2022-11-19 ENCOUNTER — Emergency Department (HOSPITAL_COMMUNITY): Payer: PPO

## 2022-11-19 ENCOUNTER — Other Ambulatory Visit: Payer: Self-pay

## 2022-11-19 DIAGNOSIS — Z20822 Contact with and (suspected) exposure to covid-19: Secondary | ICD-10-CM | POA: Insufficient documentation

## 2022-11-19 DIAGNOSIS — S0511XA Contusion of eyeball and orbital tissues, right eye, initial encounter: Secondary | ICD-10-CM | POA: Diagnosis not present

## 2022-11-19 DIAGNOSIS — R4182 Altered mental status, unspecified: Secondary | ICD-10-CM | POA: Diagnosis not present

## 2022-11-19 DIAGNOSIS — R531 Weakness: Secondary | ICD-10-CM | POA: Diagnosis not present

## 2022-11-19 DIAGNOSIS — N189 Chronic kidney disease, unspecified: Secondary | ICD-10-CM | POA: Insufficient documentation

## 2022-11-19 DIAGNOSIS — R410 Disorientation, unspecified: Secondary | ICD-10-CM | POA: Diagnosis not present

## 2022-11-19 DIAGNOSIS — S2241XA Multiple fractures of ribs, right side, initial encounter for closed fracture: Secondary | ICD-10-CM | POA: Diagnosis not present

## 2022-11-19 DIAGNOSIS — Z7901 Long term (current) use of anticoagulants: Secondary | ICD-10-CM | POA: Insufficient documentation

## 2022-11-19 DIAGNOSIS — I700 Atherosclerosis of aorta: Secondary | ICD-10-CM | POA: Diagnosis not present

## 2022-11-19 DIAGNOSIS — R000 Tachycardia, unspecified: Secondary | ICD-10-CM | POA: Diagnosis not present

## 2022-11-19 DIAGNOSIS — Z1152 Encounter for screening for COVID-19: Secondary | ICD-10-CM | POA: Diagnosis not present

## 2022-11-19 DIAGNOSIS — I672 Cerebral atherosclerosis: Secondary | ICD-10-CM | POA: Diagnosis not present

## 2022-11-19 DIAGNOSIS — G3184 Mild cognitive impairment, so stated: Secondary | ICD-10-CM | POA: Insufficient documentation

## 2022-11-19 DIAGNOSIS — Z794 Long term (current) use of insulin: Secondary | ICD-10-CM | POA: Insufficient documentation

## 2022-11-19 DIAGNOSIS — I6782 Cerebral ischemia: Secondary | ICD-10-CM | POA: Diagnosis not present

## 2022-11-19 DIAGNOSIS — W01198A Fall on same level from slipping, tripping and stumbling with subsequent striking against other object, initial encounter: Secondary | ICD-10-CM | POA: Diagnosis not present

## 2022-11-19 DIAGNOSIS — R918 Other nonspecific abnormal finding of lung field: Secondary | ICD-10-CM | POA: Diagnosis not present

## 2022-11-19 LAB — COMPREHENSIVE METABOLIC PANEL
ALT: 11 U/L (ref 0–44)
AST: 13 U/L — ABNORMAL LOW (ref 15–41)
Albumin: 2.9 g/dL — ABNORMAL LOW (ref 3.5–5.0)
Alkaline Phosphatase: 86 U/L (ref 38–126)
Anion gap: 11 (ref 5–15)
BUN: 9 mg/dL (ref 8–23)
CO2: 23 mmol/L (ref 22–32)
Calcium: 9.4 mg/dL (ref 8.9–10.3)
Chloride: 105 mmol/L (ref 98–111)
Creatinine, Ser: 0.83 mg/dL (ref 0.44–1.00)
GFR, Estimated: >60 mL/min (ref >60)
Glucose, Bld: 225 mg/dL — ABNORMAL HIGH (ref 70–99)
Potassium: 3.4 mmol/L — ABNORMAL LOW (ref 3.5–5.1)
Sodium: 139 mmol/L (ref 135–145)
Total Bilirubin: 0.9 mg/dL (ref 0.3–1.2)
Total Protein: 5.9 g/dL — ABNORMAL LOW (ref 6.5–8.1)

## 2022-11-19 LAB — CBC
HCT: 35.5 % — ABNORMAL LOW (ref 36.0–46.0)
Hemoglobin: 10.2 g/dL — ABNORMAL LOW (ref 12.0–15.0)
MCH: 25.4 pg — ABNORMAL LOW (ref 26.0–34.0)
MCHC: 28.7 g/dL — ABNORMAL LOW (ref 30.0–36.0)
MCV: 88.5 fL (ref 80.0–100.0)
Platelets: 277 10*3/uL (ref 150–400)
RBC: 4.01 MIL/uL (ref 3.87–5.11)
RDW: 19.1 % — ABNORMAL HIGH (ref 11.5–15.5)
WBC: 6.2 10*3/uL (ref 4.0–10.5)
nRBC: 0 % (ref 0.0–0.2)

## 2022-11-19 LAB — TSH: TSH: 1.004 u[IU]/mL (ref 0.350–4.500)

## 2022-11-19 LAB — RESP PANEL BY RT-PCR (RSV, FLU A&B, COVID)  RVPGX2
Influenza A by PCR: NEGATIVE
Influenza B by PCR: NEGATIVE
Resp Syncytial Virus by PCR: NEGATIVE
SARS Coronavirus 2 by RT PCR: NEGATIVE

## 2022-11-19 LAB — PROTIME-INR
INR: 1.5 — ABNORMAL HIGH (ref 0.8–1.2)
Prothrombin Time: 18.1 s — ABNORMAL HIGH (ref 11.4–15.2)

## 2022-11-19 MED ORDER — ONDANSETRON HCL 4 MG/2ML IJ SOLN
4.0000 mg | Freq: Once | INTRAMUSCULAR | Status: AC
  Administered 2022-11-19: 4 mg via INTRAVENOUS
  Filled 2022-11-19: qty 2

## 2022-11-19 MED ORDER — ONDANSETRON HCL 4 MG PO TABS
4.0000 mg | ORAL_TABLET | Freq: Every day | ORAL | 1 refills | Status: DC | PRN
  Filled 2022-11-19: qty 30, 30d supply, fill #0
  Filled 2023-02-18: qty 30, 30d supply, fill #1

## 2022-11-19 NOTE — Patient Instructions (Signed)
Visit Information  Thank you for taking time to visit with me today. Please don't hesitate to contact me if I can be of assistance to you.   Following are the goals we discussed today:   Goals Addressed             This Visit's Progress    Provide support, resources and mental health support       Activities and task to complete in order to accomplish goals.    Anticipate HH services to begin for PT, OT, etc Call to inquire about wheelchair you are awaiting  Continue working with the Aging Gracefully/[facility] team on plans to move forward with home repairs/adaptations to be made- make CHS aware of questions/needs Seek in-home caregiver support to aide in care needs at home Try listening to music/tv, coloring or other activities to help distract you from feelings of worry, sadness, etc. Call insurance Concierge and inquire about copay for counseling- I am happy to help connect you if agreeable- Consider follow up with [employer] program for in-home help/possibly rides too- maybe they can help with the cleaning of walls(smoke)        Our next appointment is by telephone on 11/21/22 Please call the care guide team at [phone] if you need to cancel or reschedule your appointment.   If you are experiencing a Mental Health or Behavioral Health Crisis or need someone to talk to, please call the Suicide and Crisis Lifeline: 988 call 911   The patient verbalized understanding of instructions, educational materials, and care plan provided today and DECLINED offer to receive copy of patient instructions, educational materials, and care plan.   Telephone follow up appointment with care management team member scheduled for:11/21/22 Follow up with [employer]  re: wheelchair delivery   Reece Levy, MSW, LCSW Clinical Social Worker [phone]

## 2022-11-19 NOTE — Patient Outreach (Signed)
Care Coordination   Follow Up Visit Note   11/19/2022 Name: Larona Sheffler MRN: [id number] DOB: 1950/12/22  Phill Myron Webb is a 72 y.o. year old female who sees [name], [name], MD for primary care. I  spoke with pt's sister.  What matters to the patients health and wellness today?  Released to home from SNF-  awaiting wheelchair delivery and HH to begin.    Goals Addressed             This Visit's Progress    Provide support, resources and mental health support       Activities and task to complete in order to accomplish goals.    Anticipate HH services to begin for PT, OT, etc Call to inquire about wheelchair you are awaiting  Continue working with the [facility] team on plans to move forward with home repairs/adaptations to be made- make CHS aware of questions/needs Seek in-home caregiver support to aide in care needs at home Try listening to music/tv, coloring or other activities to help distract you from feelings of worry, sadness, etc. Call insurance Concierge and inquire about copay for counseling- I am happy to help connect you if agreeable- Consider follow up with [facility] program for in-home help/possibly rides too- maybe they can help with the cleaning of walls(smoke)        SDOH assessments and interventions completed:  Yes     Care Coordination Interventions:  Yes, provided  Interventions Today    Flowsheet Row Most Recent Value  Chronic Disease   Chronic disease during today's visit Other  [wrist injury]  General Interventions   General Interventions Discussed/Reviewed General Interventions Discussed, [employer], Level of Care, Horticulturist, commercial (DME)  [Sister states they are awaiting DME delivery- wheelchair. Suggested she call [facility] to inquire]  Durable Medical Equipment (DME) Wheelchair  [Per SNF rep, wheelchair was ordered for delivery to home from [employer]]  Wheelchair Standard  Level  of Care Personal Care Services  [SNF rep reports a referral was made to [facility] for [employer] program. CSW encouraged sister to consider using this (30 hrs/annually) and getting it set up prior to her being out of town in 2 weeks.]  Exercise Interventions   Exercise Discussed/Reviewed Exercise Discussed, Physical Activity  [[facility] aranged for [facility] care per SNF rep.]  Physical Activity Discussed/Reviewed Physical Activity Discussed  [HHPT is scheduled to come to pt's home]  Education Interventions   Provided Verbal Education On Nutrition  [meals on wheels recipient- sister to get it set back up]  Safety Interventions   Safety Discussed/Reviewed Safety Discussed  [pt living alone and with recent falls while at SNF- now home without 24/7 care- sister taking her to Ortho visit tomorrow for assessment/ treatment plan for wrist injury/?break.  CSW discussed possible options for  family, friends, hiring help at home.]       Follow up plan: Follow up call scheduled for 11/21/22    Encounter Outcome:  Patient Visit Completed

## 2022-11-19 NOTE — Discharge Instructions (Addendum)
Dear Ms Tseng,  It was a pleasure taking care of you at [facility]. You were seen for altered mental status but all your images and labs come back normal. We are discharging you home now. Please follow the following instructions.   1) Please follow up with a neurologist. The information is in your discharge paper work. They will also reach out to you 2) Please Follow up with you primary care physician as soon as possible. 3) I have reached out to your sister to help you with your insulin  4) Please pick up Zofran from your pharmacy to help you with your nausea. Take 4 MG AS needed.  Take care,  Dr. Kathleen Lime, MD

## 2022-11-19 NOTE — ED Provider Notes (Signed)
[place] EMERGENCY DEPARTMENT AT [facility] Provider Note   CSN: [id number] Arrival date & time: 11/19/22  6295     History  Chief Complaint  Patient presents with   Altered Mental Status    [name] is a 72 y.o. female.   Altered Mental Status This is a 72 year old female, with a remarkable history of prior CVA, chronic kidney disease, hyperlipidemia, anxiety who presented to the emergency hospital via EMS due to concerns for altered mental status.   4 weeks ago, she broke her hip and was discharged  rehab. She fell and hit her head at the rehab ,came to the ED but CT head didn't show any acute intracranial pathology at that time.. She is on warfarin. Her sister who gave a substantial portion of the history said the patient has been acting funny since Sunday .   Patient is scheduled to see ortho tomorrow 10/10 ,for her wrist fracture     Home Medications Prior to Admission medications   Medication Sig Start Date End Date Taking? Authorizing Provider  albuterol (PROAIR HFA) 108 (90 Base) MCG/ACT inhaler Inhale 2 puffs into the lungs every 6 (six) hours as needed for shortness of breath. 02/12/22   Miguel Aschoff, MD  ALPRAZolam Prudy Feeler) 1 MG tablet Take 1/2 - 1 tablet (1/2  - 1 mg total) by mouth at bedtime as needed for anxiety or sleep. May take 1/2 tablet during the day if needed for anxiety 10/22/22   Hassan Rowan, [place], MD  buPROPion (WELLBUTRIN XL) 150 MG 24 hr tablet Take 1 tablet (150 mg total) by mouth every morning. 12/23/21   Miguel Aschoff, MD  cetirizine (ZYRTEC ALLERGY) 10 MG tablet Take 1 tablet (10 mg total) by mouth daily. 09/19/21 10/21/22  Miguel Aschoff, MD  cholecalciferol (VITAMIN D3) 25 MCG (1000 UNIT) tablet Take 1 tablet (1,000 Units total) by mouth daily. 10/22/22   Alexander-Savino, [place], MD  Continuous Glucose Sensor (FREESTYLE LIBRE 3 SENSOR) MISC Place 1 sensor on the skin every 14 days. Use to check  glucose 6 times daily as directed 12/23/21   Miguel Aschoff, MD  docusate sodium (COLACE) 100 MG capsule Take 1 capsule (100 mg total) by mouth daily as needed for mild constipation (use daily until BM become regular). 10/22/22   Alexander-Savino, [place], MD  DULoxetine (CYMBALTA) 60 MG capsule Take 1 capsule (60 mg total) by mouth 2 (two) times daily. 03/20/22   Miguel Aschoff, MD  enoxaparin (LOVENOX) 80 MG/0.8ML injection Inject 0.7 mLs (70 mg total) into the skin every 12 (twelve) hours. 10/23/22   Alexander-Savino, [place], MD  Fluticasone-Umeclidin-Vilant (TRELEGY ELLIPTA) 100-62.5-25 MCG/ACT AEPB Inhale 1 puff into the lungs daily. 03/14/22   Waymon Budge, MD  furosemide (LASIX) 80 MG tablet Take 1 tablet (80 mg total) by mouth every morning. 08/26/22   Miguel Aschoff, MD  gabapentin (NEURONTIN) 600 MG tablet Take 600 mg by mouth at bedtime.    [provider]  HYDROcodone-acetaminophen (NORCO/VICODIN) 5-325 MG tablet Take 2 tablets by mouth every 6 (six) hours as needed for moderate pain. 10/22/22   Alexander-Savino, [place], MD  insulin degludec (TRESIBA FLEXTOUCH) 100 UNIT/ML FlexTouch Pen Inject 44 Units into the skin daily. 06/09/22 11/27/22  Miguel Aschoff, MD  ipratropium (ATROVENT) 0.03 % nasal spray Instill 2 sprays into the nose 3 (three) times daily as directed 06/20/22   Miguel Aschoff, MD  ipratropium-albuterol (DUONEB) 0.5-2.5 (3) MG/3ML SOLN Inhale 3 mLs by  nebulization every 6 (six) hours as needed (shortness of breath, wheezing). 04/03/22   Miguel Aschoff, MD  ketoconazole (NIZORAL) 2 % cream Apply 1 application topically at bedtime as needed for irritation. 09/08/22   [name], RPH-CPP  lidocaine-prilocaine (EMLA) cream Apply 1 Application topically as needed. 09/01/22   McCaughan, Dia D, DPM  linaclotide (LINZESS) 72 MCG capsule Take 1 capsule (72 mcg total) by mouth daily before breakfast. 08/21/22   Miguel Aschoff, MD   metoprolol succinate (TOPROL XL) 25 MG 24 hr tablet Take 1 tablet (25 mg total) by mouth daily. 01/30/22   Adron Bene, MD  nicotine (NICODERM CQ - DOSED IN MG/24 HOURS) 14 mg/24hr patch Place 1 patch (14 mg total) onto the skin daily at 6 (six) AM. 10/23/22   Alexander-Savino, [place], MD  ondansetron (ZOFRAN) 4 MG tablet Take 1 tablet (4 mg total) by mouth every 8 (eight) hours as needed for nausea or vomiting. Patient not taking: Reported on 10/21/2022 12/23/21 12/23/22  Miguel Aschoff, MD  OXYGEN Inhale 2 L into the lungs at bedtime.    [provider]  polyethylene glycol (MIRALAX / GLYCOLAX) 17 g packet Take 17 g by mouth daily as needed (use daily until BM become regular). 10/22/22   Alexander-Savino, [place], MD  potassium chloride SA (KLOR-CON M) 20 MEQ tablet Take 1 tablet (20 mEq total) by mouth daily. 08/26/22   Miguel Aschoff, MD  rosuvastatin (CRESTOR) 20 MG tablet Take 1 tablet (20 mg total) by mouth at bedtime. 03/14/22 03/09/23  Miguel Aschoff, MD  Semaglutide, 1 MG/DOSE, 4 MG/3ML SOPN Inject 1 mg into the skin once a week. 05/01/22   Miguel Aschoff, MD  warfarin (COUMADIN) 2.5 MG tablet Take 1 tablet (2.5mg ) by mouth on Tuesday, Friday and Sunday. Take half-a-tablet (1.25mg ) on Monday, Wednesday, Thursday, Saturday. Or take as instructed 10/22/22   Reymundo Poll, MD      Allergies    Dexilant [dexlansoprazole], Lorazepam, Oxycontin [oxycodone], and Tramadol hcl    Review of Systems   Review of Systems  Physical Exam Updated Vital Signs BP (!) 187/72   Pulse (!) 102   Temp 98.2 F (36.8 C) (Oral)   Resp 18   SpO2 95%  Physical Exam Constitutional:      General: She is not in acute distress.    Appearance: She is obese. She is ill-appearing. She is not toxic-appearing.  Cardiovascular:     Rate and Rhythm: Tachycardia present.  Pulmonary:     Effort: Pulmonary effort is normal.  Neurological:     General: No focal deficit present.      Mental Status: She is alert and oriented to person, place, and time.     Sensory: No sensory deficit.     Motor: Weakness present.     ED Results / Procedures / Treatments   Labs (all labs ordered are listed, but only abnormal results are displayed) Labs Reviewed  COMPREHENSIVE METABOLIC PANEL - Abnormal; Notable for the following components:      Result Value   Potassium 3.4 (*)    Glucose, Bld 225 (*)    Total Protein 5.9 (*)    Albumin 2.9 (*)    AST 13 (*)    All other components within normal limits  CBC - Abnormal; Notable for the following components:   Hemoglobin 10.2 (*)    HCT 35.5 (*)    MCH 25.4 (*)    MCHC 28.7 (*)  RDW 19.1 (*)    All other components within normal limits  PROTIME-INR - Abnormal; Notable for the following components:   Prothrombin Time 18.1 (*)    INR 1.5 (*)    All other components within normal limits  RESP PANEL BY RT-PCR (RSV, FLU A&B, COVID)  RVPGX2  TSH  URINALYSIS, ROUTINE W REFLEX MICROSCOPIC  RAPID URINE DRUG SCREEN, HOSP PERFORMED    EKG EKG Interpretation Date/Time:  Wednesday November 19 2022 16:49:30 EDT Ventricular Rate:  98 PR Interval:  213 QRS Duration:  101 QT Interval:  370 QTC Calculation: 473 R Axis:   214  Text Interpretation: Sinus tachycardia Supraventricular bigeminy Borderline prolonged PR interval Lateral infarct, old Anteroseptal infarct, age indeterminate Confirmed by Glyn Ade ([id number]) on 11/19/2022 5:13:47 PM  Radiology CT Head Wo Contrast  Result Date: 11/19/2022 CLINICAL DATA:  Altered mental status EXAM: CT HEAD WITHOUT CONTRAST TECHNIQUE: Contiguous axial images were obtained from the base of the skull through the vertex without intravenous contrast. RADIATION DOSE REDUCTION: This exam was performed according to the departmental dose-optimization program which includes automated exposure control, adjustment of the mA and/or kV according to patient size and/or use of iterative reconstruction  technique. COMPARISON:  CT head 11/16/2022 FINDINGS: Brain: No intracranial hemorrhage, mass effect, or evidence of acute infarct. No hydrocephalus. No extra-axial fluid collection. Generalized cerebral atrophy and chronic small vessel ischemic disease. Chronic bilateral basal ganglia infarcts. Vascular: No hyperdense vessel. Intracranial arterial calcification. Skull: No fracture or focal lesion. Sinuses/Orbits: No acute finding. Other: None. IMPRESSION: 1. No acute intracranial abnormality. 2. Age-commensurate cerebral atrophy and chronic small vessel ischemic disease. Chronic bilateral basal ganglia infarcts. Electronically Signed   By: Minerva Fester M.D.   On: 11/19/2022 20:06   DG CHEST PORT 1 VIEW  Result Date: 11/19/2022 CLINICAL DATA:  Altered mental status. Recently discharged from from skilled nursing facility. EXAM: PORTABLE CHEST 1 VIEW COMPARISON:  Radiograph 11/16/2022 FINDINGS: Stable cardiomediastinal contours post median sternotomy. Aortic atherosclerosis. Chronic bronchial thickening without acute airspace disease. Left costophrenic angle is not entirely included in the field of view. No pneumothorax or large pleural effusion. Multiple remote right rib fractures. Right clavicle fixation. IMPRESSION: No acute findings.  Chronic bronchial thickening. Electronically Signed   By: Narda Rutherford M.D.   On: 11/19/2022 19:37    Procedures Procedures    Medications Ordered in ED Medications  ondansetron (ZOFRAN) injection 4 mg (has no administration in time range)  ondansetron (ZOFRAN) injection 4 mg (4 mg Intravenous Given 11/19/22 1730)    ED Course/ Medical Decision Making/ A&P Clinical Course as of 11/19/22 2101  Wed Nov 19, 2022  1711 Stable BIBA AMS (Resident) Family aware. Report 4 weeks ago hip fx. Went to rehab. 1x week ago she fell and hit head, never seen.  Did break her wrist with that fall.  Ortho apt for tomorrow.  Has been confused x1 week since the second fall.  Brought in from facility. [CC]    Clinical Course User Index [CC] Glyn Ade, MD                                 Medical Decision Making Amount and/or Complexity of Data Reviewed Labs: ordered. Radiology: ordered.  Risk Prescription drug management.   This patient is a 72 y.o. female who presents to the ED for concern of AMS, this involves an extensive number of treatment options, and is a complaint that  carries with it a high risk of complications and morbidity. The emergent differential diagnosis prior to evaluation includes, but is not limited to AMS 2/2 to infections vs structural causes vs toxin vs medication induced or metabolic derangement  . This is not an exhaustive differential.  Chest x-ray did not reveal any evidence of active infectious processes at this time.  Do have a hypokalemia of 3.4 otherwise denies any chest pain EKG fairly unremarkable compared to the last one that was done 3 days ago.  CMP and CBC fairly markable and unchanged from previous labs. Her INR actually got better from 2.5-1.6 today .  Checks x-ray did not show any acute infectious processes.  I wonder if this altered mental status is due to the fact that she has not been taking her insulin as prescribed.Her cognitive impairment could be from her recent fall and being in and out of the hospital.  I have recommended follow-up with a neurologist to further workup for dementia.  Lab Tests: I ordered, and personally interpreted labs.  The pertinent results include:  TSH normal at 1.0. CMP showed a hypokalemia of 3.4 . Glucose of 225. Otherwise unremarkable    Imaging Studies: I ordered imaging studies including CT Head wo contrast and CXR. I independently visualized and interpreted CXR which showed no evidence of infectious process at this time.CT head showed no acute intracranial abnormality.  I agree with the radiologist interpretation.    Medications: I ordered medication including Zofran  for nausea.  Reevaluation of the patient after these medicines showed that the patient improved. I have reviewed the patients home medicines and have made adjustments as needed.  Disposition: After consideration of the diagnostic results and the patients response to treatment, I feel that emergency department workup does not suggest an emergent condition requiring admission or immediate intervention beyond what has been performed at this time.The patient is safe for discharge and has been instructed to return immediately for worsening symptoms, change in symptoms or any other concerns.  I discussed this case with my attending physician Dr. Doran Durand who cosigned this note including patient's presenting symptoms, physical exam, and planned diagnostics and interventions. Attending physician stated agreement with plan or made changes to plan which were implemented.           Final Clinical Impression(s) / ED Diagnoses Final diagnoses:  Mild cognitive impairment with memory loss  Altered mental status, unspecified altered mental status type    Rx / DC Orders ED Discharge Orders          Ordered    Ambulatory referral to Neurology       Comments: An appointment is requested in approximately: 1 week   11/19/22 2057              Kathleen Lime, MD 11/19/22 2120    Glyn Ade, MD 11/24/22 1504

## 2022-11-19 NOTE — ED Triage Notes (Signed)
Patient discharged from SNF back to home on Monday and family states that since then she has not been acting herself, though she can answer orientation questions appropriately. Patient on Coumadin and had fallen and hit her head on Sunday, after falling and breaking her wrist on Friday.

## 2022-11-19 NOTE — Care Management (Signed)
ED RNCM met with patient to discuss Home Health recommendations.  Patient was recently discharge from [facility].  I spoke with sister Cynda Acres [phone] who reports that [facility] may have been arranged already. Provided  my contact info to call me tomorrow if she can find the name of the [facility] agency.

## 2022-11-19 NOTE — ED Notes (Signed)
Patient transported to CT 

## 2022-11-20 ENCOUNTER — Other Ambulatory Visit: Payer: Self-pay

## 2022-11-20 ENCOUNTER — Other Ambulatory Visit (HOSPITAL_COMMUNITY): Payer: Self-pay

## 2022-11-20 ENCOUNTER — Other Ambulatory Visit: Payer: Self-pay | Admitting: Dietician

## 2022-11-20 DIAGNOSIS — G8929 Other chronic pain: Secondary | ICD-10-CM

## 2022-11-20 DIAGNOSIS — F419 Anxiety disorder, unspecified: Secondary | ICD-10-CM

## 2022-11-20 NOTE — Telephone Encounter (Signed)
Needs help with: 1- Has had no insulin in 10 days, wants to know how to restart it Evaristo Bury)?  Blood sugar fasting >200 mg/dl.  2- Refill check on pain and anxiety pills, if she needs refills,can that be done?Marland Kitchen

## 2022-11-21 ENCOUNTER — Ambulatory Visit: Payer: Self-pay | Admitting: *Deleted

## 2022-11-21 NOTE — Patient Outreach (Signed)
Care Coordination   Follow Up Visit Note   11/21/2022 Name: [name] MRN: [id number] DOB: 20-Apr-1950  [name] is a 72 y.o. year old female who sees [name], [name], MD for primary care. I spoke with  [name] by phone today.  What matters to the patients health and wellness today?  Still hurting from hand/wrist injury.    Goals Addressed             This Visit's Progress    Provide support, resources and mental health support       Activities and task to complete in order to accomplish goals.    Anticipate HH services to begin for PT, OT, etc Call to inquire about wheelchair you are awaiting  Continue working with the [facility] team on plans to move forward with home repairs/adaptations to be made- make CHS aware of questions/needs Seek in-home caregiver support to aide in care needs at home Try listening to music/tv, coloring or other activities to help distract you from feelings of worry, sadness, etc. Call insurance Concierge and inquire about copay for counseling- I am happy to help connect you if agreeable- Consider follow up with [facility] program for in-home help/        SDOH assessments and interventions completed:  Yes     Care Coordination Interventions:  Yes, provided  Interventions Today    Flowsheet Row Most Recent Value  Chronic Disease   Chronic disease during today's visit Other  [hand injury]  General Interventions   General Interventions Discussed/Reviewed General Interventions Discussed, Doctor Visits  Doctor Visits Discussed/Reviewed Specialist  [Pt reports she did not go to Orthopedist yet- hoping next Tuesday to be seen for her hand injury]  Exercise Interventions   Exercise Discussed/Reviewed Exercise Discussed  [HHPT to begin]  Physical Activity Discussed/Reviewed Physical Activity Discussed  Mental Health Interventions   Mental Health Discussed/Reviewed Mental  Health Discussed  Safety Interventions   Safety Discussed/Reviewed Safety Discussed  [name] is staying with her today- helping with meals and ADLs]       Follow up plan: Follow up call scheduled for 11/25/22    Encounter Outcome:  Patient Visit Completed

## 2022-11-21 NOTE — Patient Instructions (Signed)
Visit Information  Thank you for taking time to visit with me today. Please don't hesitate to contact me if I can be of assistance to you.   Following are the goals we discussed today:   Goals Addressed             This Visit's Progress    Provide support, resources and mental health support       Activities and task to complete in order to accomplish goals.    Anticipate HH services to begin for PT, OT, etc Call to inquire about wheelchair you are awaiting  Continue working with the [facility] team on plans to move forward with home repairs/adaptations to be made- make CHS aware of questions/needs Seek in-home caregiver support to aide in care needs at home Try listening to music/tv, coloring or other activities to help distract you from feelings of worry, sadness, etc. Call insurance Concierge and inquire about copay for counseling- I am happy to help connect you if agreeable- Consider follow up with [facility] program for in-home help/        Our next appointment is by telephone on 11/25/22   Please call the care guide team at [phone] if you need to cancel or reschedule your appointment.   If you are experiencing a Mental Health or Behavioral Health Crisis or need someone to talk to, please call the Suicide and Crisis Lifeline: 988 call 911   The patient verbalized understanding of instructions, educational materials, and care plan provided today and DECLINED offer to receive copy of patient instructions, educational materials, and care plan.   Telephone follow up appointment with care management team member scheduled for:11/25/22  Reece Levy, MSW, LCSW Clinical Social Worker [phone]

## 2022-11-23 ENCOUNTER — Telehealth: Payer: Self-pay | Admitting: Student

## 2022-11-23 NOTE — Telephone Encounter (Signed)
Fielded call from [name] due to concern for chest pain. She reports generalized chest pain that hurts when she coughs while resting on her side in bed. It feels sharp and radiates to her back. It is not present during our conversation when she is sitting upright. It is not associated with shortness of breath or nausea. She has already taken one of her Norco. She is also concerned about her medication list. She was recently discharged from SNF after suffering a fall and reports a lot of uncertainty about what medications she is supposed to be taking. She sounds okay over the phone, not distressed. Her adult granddaughter is staying with her overnight. Her chest pain does not sound high risk to me, but I acknowledged that I can't be certain about this without an in-person evaluation. I recommended that she call 911 if the chest pain recurs, especially if it gets worse, is not associated with coughing, is associated with shortness of breath. Otherwise, she ought to take an additional Tylenol (an additional 500 mg is safe on top of the 325 mg present in her Norco) and rest in a position of comfort. I recommend that she have an in-person appointment in the clinic as soon as possible. Apparently Dr. Geraldo Pitter has an opening at 10:45 a.m. on 10/14 which I will try to ensure remains available for her. I instructed her to bring her medications to her clinic appointment.  Kathryn Mood MD 11/24/2022, 12:32 AM

## 2022-11-24 ENCOUNTER — Telehealth: Payer: Self-pay | Admitting: Internal Medicine

## 2022-11-24 ENCOUNTER — Telehealth: Payer: Self-pay | Admitting: *Deleted

## 2022-11-24 NOTE — Telephone Encounter (Signed)
I called pt to see if she can come in this am. Currently, 2 am appts are available (10:15 and 10:45). At first pt stated unable to come then stated she will call someone and call the office back. Also informed pt we have no available appts this week at this time.

## 2022-11-24 NOTE — Telephone Encounter (Signed)
Pt called back and stated she had called several people and unable to get a transportation.First available appt given - with Dr Nooruddin on 12/10/22.

## 2022-11-24 NOTE — Telephone Encounter (Signed)
Received page from operator requesting return from call for patient.  Ms. Sinagra explains that she is not going to be able to make it in for the 10:45 appointment discussed on the phone with Dr. Benito Mccreedy overnight. She would be able to come in at a later time but I do not see openings when I scan the clinic schedule for today.  Front desk opens in about 3 minutes. I explained that I will have our front desk team give her a call to help with scheduling.  No further questions or concerns at this time.  Champ Mungo, DO

## 2022-11-25 ENCOUNTER — Ambulatory Visit: Payer: HMO | Admitting: Podiatry

## 2022-11-25 DIAGNOSIS — S52121A Displaced fracture of head of right radius, initial encounter for closed fracture: Secondary | ICD-10-CM | POA: Diagnosis not present

## 2022-11-25 NOTE — Telephone Encounter (Signed)
Thank you for forwarding the message.  I really do need to see her myself given her complexity and recent med changes and have openings next Tuesday morning.  I'll share with front desk to see if we can get her in.

## 2022-11-26 ENCOUNTER — Ambulatory Visit: Payer: Self-pay | Admitting: *Deleted

## 2022-11-26 NOTE — Patient Instructions (Signed)
Visit Information  Thank you for taking time to visit with me today. Please don't hesitate to contact me if I can be of assistance to you.   Following are the goals we discussed today:   Goals Addressed             This Visit's Progress    Provide support, resources and mental health support       Activities and task to complete in order to accomplish goals.    Follow up with [facility] services to resume the PT, OT, as soon as you are cleared Call to inquire about wheelchair you are awaiting  Continue working with the [facility] team on plans to move forward with home repairs/adaptations to be made- make CHS aware of questions/needs Continue with family support- granddaughter and her child staying and helping out Seek in-home caregiver support to aide in care needs at home Try listening to music/tv, coloring or other activities to help distract you from feelings of worry, sadness, etc. Call insurance Concierge and inquire about copay for counseling- I am happy to help connect you if agreeable- Consider follow up with [facility] for in-home help/        Our next appointment is by telephone on 12/03/22  Please call the care guide team at [phone] if you need to cancel or reschedule your appointment.   If you are experiencing a Mental Health or Behavioral Health Crisis or need someone to talk to, please call the Suicide and Crisis Lifeline: 988 call 911   The patient verbalized understanding of instructions, educational materials, and care plan provided today and DECLINED offer to receive copy of patient instructions, educational materials, and care plan.   Telephone follow up appointment with care management team member scheduled for:12/03/22  Reece Levy, MSW, LCSW Clinical Social Worker [phone]

## 2022-11-26 NOTE — Patient Outreach (Signed)
Care Coordination   Follow Up Visit Note   11/26/2022 Name: [name] MRN: [id number] DOB: 1950/07/04  [name] is a 72 y.o. year old female who sees [name], Kathryn Ar, MD for primary care. I spoke with  [name] by phone today.  What matters to the patients health and wellness today?  Got a cast on hand yesterday.    Goals Addressed             This Visit's Progress    Provide support, resources and mental health support       Activities and task to complete in order to accomplish goals.    Follow up with [facility] services to resume the PT, OT, as soon as you are cleared Call to inquire about wheelchair you are awaiting  Continue working with the [facility] team on plans to move forward with home repairs/adaptations to be made- make CHS aware of questions/needs Continue with family support- granddaughter and her child staying and helping out Seek in-home caregiver support to aide in care needs at home Try listening to music/tv, coloring or other activities to help distract you from feelings of worry, sadness, etc. Call insurance Concierge and inquire about copay for counseling- I am happy to help connect you if agreeable- Consider follow up with [facility] for in-home help/        SDOH assessments and interventions completed:  Yes     Care Coordination Interventions:  Yes, provided  Interventions Today    Flowsheet Row Most Recent Value  Chronic Disease   Chronic disease during today's visit Other  [pt reports a cast was placed on her hand.]  General Interventions   General Interventions Discussed/Reviewed General Interventions Discussed, Community Resources  [facility] PT and OT were cancelled and will have to be resumed]  Durable Medical Equipment (DME) Wheelchair  [wheelchair ordered at time of SNF discharge has been received.]  Safety Interventions   Safety Discussed/Reviewed Safety Discussed   [name] is assisting pt and staying with her]       Follow up plan: Follow up call scheduled for 12/03/22    Encounter Outcome:  Patient Visit Completed

## 2022-11-28 ENCOUNTER — Other Ambulatory Visit (HOSPITAL_COMMUNITY): Payer: Self-pay

## 2022-12-01 NOTE — Telephone Encounter (Signed)
She said she was at home with an aid at the time of her call.

## 2022-12-02 ENCOUNTER — Other Ambulatory Visit: Payer: Self-pay

## 2022-12-02 ENCOUNTER — Other Ambulatory Visit (HOSPITAL_COMMUNITY): Payer: Self-pay

## 2022-12-02 ENCOUNTER — Encounter: Payer: Self-pay | Admitting: Internal Medicine

## 2022-12-02 ENCOUNTER — Ambulatory Visit: Payer: PPO | Admitting: Internal Medicine

## 2022-12-02 VITALS — BP 173/87 | HR 88 | Temp 97.5°F | Ht 62.0 in | Wt 150.1 lb

## 2022-12-02 DIAGNOSIS — G629 Polyneuropathy, unspecified: Secondary | ICD-10-CM | POA: Diagnosis not present

## 2022-12-02 DIAGNOSIS — Z794 Long term (current) use of insulin: Secondary | ICD-10-CM

## 2022-12-02 DIAGNOSIS — G3184 Mild cognitive impairment, so stated: Secondary | ICD-10-CM | POA: Diagnosis not present

## 2022-12-02 DIAGNOSIS — Z7985 Long-term (current) use of injectable non-insulin antidiabetic drugs: Secondary | ICD-10-CM | POA: Diagnosis not present

## 2022-12-02 DIAGNOSIS — Z7901 Long term (current) use of anticoagulants: Secondary | ICD-10-CM | POA: Diagnosis not present

## 2022-12-02 DIAGNOSIS — R296 Repeated falls: Secondary | ICD-10-CM

## 2022-12-02 DIAGNOSIS — Z8679 Personal history of other diseases of the circulatory system: Secondary | ICD-10-CM

## 2022-12-02 DIAGNOSIS — F1190 Opioid use, unspecified, uncomplicated: Secondary | ICD-10-CM

## 2022-12-02 DIAGNOSIS — E1169 Type 2 diabetes mellitus with other specified complication: Secondary | ICD-10-CM | POA: Diagnosis not present

## 2022-12-02 DIAGNOSIS — T402X5D Adverse effect of other opioids, subsequent encounter: Secondary | ICD-10-CM | POA: Diagnosis not present

## 2022-12-02 DIAGNOSIS — I739 Peripheral vascular disease, unspecified: Secondary | ICD-10-CM | POA: Diagnosis not present

## 2022-12-02 DIAGNOSIS — E785 Hyperlipidemia, unspecified: Secondary | ICD-10-CM

## 2022-12-02 DIAGNOSIS — D509 Iron deficiency anemia, unspecified: Secondary | ICD-10-CM

## 2022-12-02 DIAGNOSIS — E1122 Type 2 diabetes mellitus with diabetic chronic kidney disease: Secondary | ICD-10-CM | POA: Diagnosis not present

## 2022-12-02 DIAGNOSIS — E7841 Elevated Lipoprotein(a): Secondary | ICD-10-CM

## 2022-12-02 DIAGNOSIS — R5381 Other malaise: Secondary | ICD-10-CM

## 2022-12-02 DIAGNOSIS — I10 Essential (primary) hypertension: Secondary | ICD-10-CM | POA: Diagnosis not present

## 2022-12-02 DIAGNOSIS — R519 Headache, unspecified: Secondary | ICD-10-CM | POA: Diagnosis not present

## 2022-12-02 DIAGNOSIS — Z79899 Other long term (current) drug therapy: Secondary | ICD-10-CM

## 2022-12-02 DIAGNOSIS — G8929 Other chronic pain: Secondary | ICD-10-CM

## 2022-12-02 DIAGNOSIS — G44229 Chronic tension-type headache, not intractable: Secondary | ICD-10-CM

## 2022-12-02 DIAGNOSIS — N182 Chronic kidney disease, stage 2 (mild): Secondary | ICD-10-CM

## 2022-12-02 DIAGNOSIS — M800AXD Age-related osteoporosis with current pathological fracture, other site, subsequent encounter for fracture with routine healing: Secondary | ICD-10-CM

## 2022-12-02 DIAGNOSIS — M8000XD Age-related osteoporosis with current pathological fracture, unspecified site, subsequent encounter for fracture with routine healing: Secondary | ICD-10-CM

## 2022-12-02 DIAGNOSIS — S72002D Fracture of unspecified part of neck of left femur, subsequent encounter for closed fracture with routine healing: Secondary | ICD-10-CM

## 2022-12-02 DIAGNOSIS — I129 Hypertensive chronic kidney disease with stage 1 through stage 4 chronic kidney disease, or unspecified chronic kidney disease: Secondary | ICD-10-CM

## 2022-12-02 DIAGNOSIS — K5903 Drug induced constipation: Secondary | ICD-10-CM

## 2022-12-02 DIAGNOSIS — Z8781 Personal history of (healed) traumatic fracture: Secondary | ICD-10-CM

## 2022-12-02 DIAGNOSIS — S62101D Fracture of unspecified carpal bone, right wrist, subsequent encounter for fracture with routine healing: Secondary | ICD-10-CM

## 2022-12-02 DIAGNOSIS — F419 Anxiety disorder, unspecified: Secondary | ICD-10-CM

## 2022-12-02 DIAGNOSIS — T402X5A Adverse effect of other opioids, initial encounter: Secondary | ICD-10-CM

## 2022-12-02 DIAGNOSIS — E559 Vitamin D deficiency, unspecified: Secondary | ICD-10-CM

## 2022-12-02 LAB — POCT GLYCOSYLATED HEMOGLOBIN (HGB A1C): Hemoglobin A1C: 7.7 % — AB (ref 4.0–5.6)

## 2022-12-02 LAB — GLUCOSE, CAPILLARY: Glucose-Capillary: 266 mg/dL — ABNORMAL HIGH (ref 70–99)

## 2022-12-02 MED ORDER — HYDROCODONE-ACETAMINOPHEN 5-325 MG PO TABS
ORAL_TABLET | ORAL | 0 refills | Status: DC
  Filled 2022-12-02: qty 90, 30d supply, fill #0

## 2022-12-02 MED ORDER — VITAMIN D3 1.25 MG (50000 UT) PO CAPS
50000.0000 [IU] | ORAL_CAPSULE | ORAL | 0 refills | Status: AC
  Filled 2022-12-02: qty 6, 42d supply, fill #0

## 2022-12-02 MED ORDER — HYDROCODONE-ACETAMINOPHEN 5-325 MG PO TABS: ORAL_TABLET | ORAL | 0 refills | Status: DC

## 2022-12-02 NOTE — Assessment & Plan Note (Signed)
Currently heart auscultates irreg irreg. Rate controlled.  Anticoagulated.  Asymptomatic.

## 2022-12-02 NOTE — Progress Notes (Signed)
Hospitalized 8/15-19/2024 for symptomatic acute on chronic iron deficiency anemia of unknown etiology, transfused, referred to outpatient hematology though this appointment has not yet occurred.  Hospitalized 9/6-12/2022 for care of the left hip fracture sustained during a fall, and associated medical concerns.  ED visit 11/14/2022 - fall at facility resulting in distal R radial fracture, splint applied in ED on 11/16/2022, recommended to f/u with on-call ortho Dr. Hulda Humphrey (who had performed her hip repair). She was to be NWB on that arm, not even to use her walker, which was noted to require another PT evaluation for this acute problem.  She will follow-up with him this Friday the 25th.  She was discharged from SNF  to home, where 24/7 assistance is currently be provided by her granddaughter who has brought along her 96-year-old.  Food is being prepared for [name] and she is being assisted with all ADLs, primarily being pushed in the rollator.  She declined/dismissed home PT OT.  Has been smoking more.  Not sleeping, head hurting for the past week.  Bowels moving okay.  Concerned about refills for her Xanax and hydrocodone.   My last visit with her was 10/09/2022 which was a hospital follow-up from the earlier admission.  At that time, she had upcoming visits scheduled with ophthalmologist for routine care, a consultation with a new pulmonologist (rescheduled for 12/11/22), and follow-up with the vascular physicians for ongoing surveillance. The hematology referral hasn't yet materialized.    All but the pulmonary appt will need to be rescheduled.  Patient Active Problem List   Diagnosis Date Noted   History of atrial fibrillation 10/18/2022   Fall 10/18/2022   Chronic anticoagulation 10/18/2022   Preop examination 10/18/2022   Chronic diastolic heart failure (HCC) 10/18/2022   Pulmonary hypertension, unspecified (HCC) 10/18/2022   Closed left hip fracture, initial encounter (HCC) 10/17/2022    Encephalopathy 10/17/2022   OSA (obstructive sleep apnea) 10/17/2022   Generalized pain 08/01/2022   Multifactorial functional impairment 02/13/2022   History of stroke without residual deficits 01/17/2022   Mild cognitive impairment with memory loss 12/23/2021   Diabetic peripheral neuropathy associated with type 2 diabetes mellitus (HCC) 11/11/2021   Recurrent falls 11/11/2021   Gait instability 09/19/2021   Perennial non-allergic rhinitis 09/19/2021   Chronic, continuous use of opioids for chronic pain 07/10/2021   Polypharmacy 11/08/2020   Personal history of renal cell carcinoma 09/12/2020   Tinea pedis of both feet 08/07/2020   Nocturnal hypoxia per sleep study 07/2019    Carotid artery stenosis; s/p R endarterectomy    COPD mixed type (HCC)    Arthritis of both hands 07/01/2019   Pain due to onychomycosis of toenails of both feet 05/27/2019   Stage 2 chronic kidney disease due to type 2 diabetes mellitus (HCC) 12/16/2018   COPD exacerbation (HCC) 11/13/2017   Lichen sclerosus of female genitalia 01/12/2017   Mixed stress and urge urinary incontinence 05/27/2016   Pulmonary hypertension due to chronic obstructive pulmonary disease (HCC) 04/25/2016   Asymptomatic cholelithiasis 09/25/2015   Chronic iron deficiency anemia 12/26/2014   Aortic atherosclerosis (HCC) 10/19/2014   Right nephrolithiasis, asymptomatic (incidental finding) 09/06/2014   Pain of great toes medial nail beds of both feet 05/19/2014   Headache 01/14/2013   Chronic low back pain 10/06/2012   Long term current use of anticoagulant therapy 09/30/2012   Recurrent candidal intertrigo 07/28/2012   Tobacco use disorder 07/28/2012   Acute hypoxic respiratory failure (HCC) 05/04/2012   History of colonic polyps  05/14/2011   Sleep disturbance 05/14/2011   Abdominal wall hernia 05/14/2011   Constipation due to opioid therapy 02/03/2011   Health care maintenance 11/04/2010   PAF (paroxysmal atrial fibrillation)  (HCC) 10/22/2010   Chronic CHF (congestive heart failure) (HCC) 10/21/2010   Fibromyalgia 08/29/2010   Gastroesophageal reflux disease    Type 2 diabetes mellitus with other specified complication (HCC)    Osteoporosis    Peripheral arterial disease (HCC) 05/27/2010   History of mitral valve replacement with bioprosthetic valve 2012   Essential hypertension 06/19/2008   Hyperlipidemia 11/20/2005   Severe major depression (HCC) 11/19/2005    Current Outpatient Medications  Medication Instructions   albuterol (PROAIR HFA) 108 (90 Base) MCG/ACT inhaler 2 puffs, Inhalation, Every 6 hours PRN   ALPRAZolam (XANAX) 1 MG tablet Take 1/2 - 1 tablet (1/2  - 1 mg total) by mouth at bedtime as needed for anxiety or sleep. May take 1/2 tablet during the day if needed for anxiety   buPROPion (WELLBUTRIN XL) 150 mg, Oral, BH-each morning   cetirizine (ZYRTEC ALLERGY) 10 mg, Oral, Daily   cholecalciferol (VITAMIN D3) 1,000 Units, Oral, Daily   Continuous Glucose Sensor (FREESTYLE LIBRE 3 SENSOR) MISC Place 1 sensor on the skin every 14 days. Use to check glucose 6 times daily as directed   docusate sodium (COLACE) 100 mg, Oral, Daily PRN   DULoxetine (CYMBALTA) 60 mg, Oral, 2 times daily   enoxaparin (LOVENOX) 70 mg, Subcutaneous, Every 12 hours   Fluticasone-Umeclidin-Vilant (TRELEGY ELLIPTA) 100-62.5-25 MCG/ACT AEPB 1 puff, Inhalation, Daily   furosemide (LASIX) 80 mg, Oral, Every morning   gabapentin (NEURONTIN) 600 mg, Oral, Daily at bedtime   HYDROcodone-acetaminophen (NORCO/VICODIN) 5-325 MG tablet 2 tablets, Oral, Every 6 hours PRN   ipratropium (ATROVENT) 0.03 % nasal spray Instill 2 sprays into the nose 3 (three) times daily as directed   ipratropium-albuterol (DUONEB) 0.5-2.5 (3) MG/3ML SOLN Inhale 3 mLs by nebulization every 6 (six) hours as needed (shortness of breath, wheezing).   ketoconazole (NIZORAL) 2 % cream Apply 1 application topically at bedtime as needed for irritation.    lidocaine-prilocaine (EMLA) cream 1 Application, Topical, As needed   Linzess 72 mcg, Oral, Daily before breakfast   metoprolol succinate (TOPROL XL) 25 mg, Oral, Daily   nicotine (NICODERM CQ - DOSED IN MG/24 HOURS) 14 mg, Transdermal, Daily   ondansetron (ZOFRAN) 4 mg, Oral, Every 8 hours PRN   ondansetron (ZOFRAN) 4 mg, Oral, Daily PRN   OXYGEN 2 L, Inhalation, Nightly   Ozempic (1 MG/DOSE) 1 mg, Subcutaneous, Weekly   polyethylene glycol (MIRALAX / GLYCOLAX) 17 g, Oral, Daily PRN   potassium chloride SA (KLOR-CON M) 20 MEQ tablet 20 mEq, Oral, Daily   rosuvastatin (CRESTOR) 20 mg, Oral, Daily at bedtime   Tresiba FlexTouch 44 Units, Subcutaneous, Daily   warfarin (COUMADIN) 2.5 MG tablet Take 1 tablet (2.5mg ) by mouth on Tuesday, Friday and Sunday. Take half-a-tablet (1.25mg ) on Monday, Wednesday, Thursday, Saturday. Or take as instructed   Objective  BP (!) 173/87 (BP Location: Left Arm, Patient Position: Sitting, Cuff Size: Small)   Horn 88   Temp (!) 97.5 F (36.4 C) (Oral)   Ht 5\' 2"  (1.575 m)   Wt 150 lb 1.6 oz (68.1 kg)   SpO2 95%   BMI 27.45 kg/m  [name] was quite fatigued and drowsy, not as cognitively sharp as her baseline.  She is fully oriented and speech is clear.  Sometimes loses train of thought in  conversation, forgetting what she wanted to say.  Ecchymosis is noted about her right eye.  She demonstrates a deep congested cough once during the visit.  Respirations appear comfortable though rate is just slightly increased.  There are rales and rhonchi primarily in the right base.  Her neck veins appear full which is unusual for her.  Her face appears fuller than usual, despite the fact that she has lost some weight.  She has not been on prednisone.  Right arm with cast, exposed fingers are warm and well-perfused, edematous, but apparently improved from prior.  They do try to keep the arm elevated.  Skin turgor of the arms is decreased, but her legs have new edema,  symmetric.  Nontender.  Her right distal foot is quite ruborous, and warmer than the left, which is its baseline characteristic dusky color.  The skin is not tender.  There is no tenderness or posterior cord palpated.    Assessment and plan:  [name] has a long recovery ahead of her, and remains at high risk for falls capable of causing additional injury.  Today's immediate concern was reconciling medications, discussing the necessity to taper her benzodiazepine as discussed below, and to ensure that she has the appropriate follow-up appointments scheduled after being in the hospital and nursing facility.  Dual visit with MSW Reece Levy today who is helping the family negotiate the challenges of finding care and caregiving.  [name] requires 24/7 help, for the foreseeable future.  There are currently no finances for paid caregiving.  Her live-in granddaughter is a very temporary arrangement.  Her daughter has to work.  Diabetes and hypertension control have regressed, which is of concern given her underlying PVD and ASCVD.  Her functional decline and frailty are significant for now we are doing the best we can under the circumstances.  Essential hypertension Did not take antihypertensives this morning.  SBP 155 and recheck at 173 today.  No significant medication changes during hospitalization or SNF rehab.  She continues on Lasix 80 mg every day together with potassium chloride 20 mEq daily, and is on metoprolol succinate 25 mg daily for dual indication with A-fib rate control.  BMP today for renal function and potassium level. ACEI is indicated for renal protection.  She is consuming more salt than usual.  Type 2 diabetes mellitus with other specified complication (HCC) A1c has risen from 7.4 in 08/2022 to 7.7 now.  She has been off of her usual regimen which has just recently been resumed after hospitalization and SNF stay.  She has had access to more frequent meals in the interim and her  granddaughter is currently staying with her and preparing food.  Sweets remain a craving, including ice cream and fruit smoothies.  No changes in regimen now that she is just now resuming previous dosing.  She has lost some weight, which may or may not be attributable to her Ozempic.  Constipation due to opioid therapy Daily Linzess currently functioning well.  Therapeutic goal achieved, continue for now.  Stage 2 chronic kidney disease due to type 2 diabetes mellitus (HCC) Urine 10/09/2022  albumin creatinine ratio 166, increased in a years time from 37 despite use of her GLP-1 a.  In addition to tighter glucose and BP control (if achievable), an ACE or ARB would be appropriate particularly given need for greater blood pressure control.  Follow-up BMP results today first.  Osteoporosis with current pathological fracture with routine healing Closed fractures of right wrist and  left hip (2 separate falls) with routine healing.  She is vitamin D deficient, and high-dose weekly therapy will be prescribed for 6 weeks to be followed by daily supplement.  Chart review-bisphosphonates prescribed as early as 2007 (though she may not have started them until much later) and ultimately not tolerated.    She does have time to benefit from initiating a bisphosphonate or other treatment.  Despite attempts to mitigate risk, she is likely to have future falls.  We do not have time to address treatment in depth today.  Mild cognitive impairment with memory loss Cognitive impairment remains mild based on absence of deficit in function due to cognition.  Her medications, disrupted sleep pattern, ongoing tobacco use, known cerebrovascular disease, and depressed and anxious mood are all important contributors.  The most important intervention is actually addressing her long-term opioid and benzodiazepine use.  Long conversation today about the necessity to taper these.  Will begin with her alprazolam to be reduced to 0.5 mg  nightly only.  No refill made, as she has a large supply at home.  Her hydrocodone will be dispensed to be taken no more than 3/day.  Recurrent falls As noted elsewhere, recent falls have resulted in left hip fracture and right wrist fracture.  Risk of injury heightened by her osteoporosis and chronic anticoagulation.  Falls are multifactorial, with many modifiable risk factors.  Today's discussion focused on medication management.  Physical and Occupational Therapy will be reordered (she had declined then initially).  Polypharmacy Problematic medications include gabapentin (currently prescribed at 300 mg tabs 2 p.o. nightly, which she had stopped prior to her hospitalizations but which has been resumed); hydrocodone; and alprazolam.  She is also on duloxetine 60 mg twice daily which is a very high dose and which could certainly be interfering with her sleep.  Ideally she would be in a controlled environment where medicines could be prescribed appropriately to get her through a taper and avoid withdrawals.  Discussed starting with taper of alprazolam to be taken only 0.5 mg nightly (she is accustomed to taking 0.5 twice daily and sometimes 1 mg at night).  She has a large supply of this at home and no refills will be made.  Hyperlipidemia Due for recheck with goal of optimizing control LDL less than 70 for secondary prevention of PVD and ASCVD.  Currently on rosuvastatin 20 mg daily.  Long term current use of anticoagulant therapy During hospitalization and surgical recovery, her warfarin was bridged with she has just resumed her warfarin.  This is managed by Dr. Alexandria Lodge.  History of fracture of left hip Closed nondisplaced fracture pinned 10/2022.  Transferred to SNF, recently discharged home.  Follow-up with orthopedist in 3 days.  History of atrial fibrillation Currently heart auscultates irreg irreg. Rate controlled.  Anticoagulated.  Asymptomatic.   Chronic iron deficiency anemia Hematology  referral placed in August 2024 has been closed without being scheduled.  I will check into this and resubmit an order if necessary.  Most recent hemoglobin 10.2 with MCV 88.5 and RDW 19.1 on 11/19/2022 which is stable.  Headache, chronic intermittent Diffuse headache has been going on daily for about a week, similar to her prior headaches, with no concern about neurodeficits.  My suspicion is of medication overuse given chronic use of opioids for her generalized pain.  We have talked about trying to avoid NSAIDs and interest of her stomach and kidney concerns.  In absence of neuro findings, will not be proceeding with imaging at  this time, although with the understanding that she is at risk for delayed SDH and had a fall a couple of weeks ago on anticoagulation.  CT at that time showed no acute findings.  Chronic, continuous use of opioids for chronic pain Down to a few pills remaining.  Refill provided allowing for no more than 3 tabs daily, and no more than 10 mg at a time.  Her pain has never been controlled on any regimen of opioid.  She is at high risk given combination of opioid and benzo, which we are addressing.  Multifactorial functional impairment As a result of her falls and injuries, and generally due to declining health, [name] is currently requiring 24/7 care which is temporarily being provided by her granddaughter, who has moved in with her 22-year-old child in tow.  It is not a safe environment given pt's smoking around oxygen.  MSW Reece Levy met with the family today to discuss a number of options.  Unfortunately finances are the current barrier.

## 2022-12-02 NOTE — Assessment & Plan Note (Signed)
Did not take antihypertensives this morning.  SBP 155 and recheck at 173 today.  No significant medication changes during hospitalization or SNF rehab.  She continues on Lasix 80 mg every day together with potassium chloride 20 mEq daily, and is on metoprolol succinate 25 mg daily for dual indication with A-fib rate control.  BMP today for renal function and potassium level. ACEI is indicated for renal protection.  She is consuming more salt than usual.

## 2022-12-02 NOTE — Assessment & Plan Note (Signed)
Due for recheck with goal of optimizing control LDL less than 70 for secondary prevention of PVD and ASCVD.  Currently on rosuvastatin 20 mg daily.

## 2022-12-02 NOTE — Assessment & Plan Note (Signed)
As noted elsewhere, recent falls have resulted in left hip fracture and right wrist fracture.  Risk of injury heightened by her osteoporosis and chronic anticoagulation.  Falls are multifactorial, with many modifiable risk factors.  Today's discussion focused on medication management.  Physical and Occupational Therapy will be reordered (she had declined then initially).

## 2022-12-02 NOTE — Assessment & Plan Note (Signed)
Closed nondisplaced fracture pinned 10/2022.  Transferred to SNF, recently discharged home.  Follow-up with orthopedist in 3 days.

## 2022-12-02 NOTE — Addendum Note (Signed)
Addended by: Bufford Spikes on: 12/02/2022 04:49 PM   Modules accepted: Orders

## 2022-12-02 NOTE — Assessment & Plan Note (Signed)
Diffuse headache has been going on daily for about a week, similar to her prior headaches, with no concern about neurodeficits.  My suspicion is of medication overuse given chronic use of opioids for her generalized pain.  We have talked about trying to avoid NSAIDs and interest of her stomach and kidney concerns.  In absence of neuro findings, will not be proceeding with imaging at this time, although with the understanding that she is at risk for delayed SDH and had a fall a couple of weeks ago on anticoagulation.  CT at that time showed no acute findings.

## 2022-12-02 NOTE — Assessment & Plan Note (Addendum)
Closed fractures of right wrist and left hip (2 separate falls) with routine healing.  She is vitamin D deficient, and high-dose weekly therapy will be prescribed for 6 weeks to be followed by daily supplement.  Chart review-bisphosphonates prescribed as early as 2007 (though she may not have started them until much later) and ultimately not tolerated.    She does have time to benefit from initiating a bisphosphonate or other treatment.  Despite attempts to mitigate risk, she is likely to have future falls.  We do not have time to address treatment in depth today.

## 2022-12-02 NOTE — Assessment & Plan Note (Signed)
Urine 10/09/2022  albumin creatinine ratio 166, increased in a years time from 37 despite use of her GLP-1 a.  In addition to tighter glucose and BP control (if achievable), an ACE or ARB would be appropriate particularly given need for greater blood pressure control.  Follow-up BMP results today first.

## 2022-12-02 NOTE — Assessment & Plan Note (Signed)
As a result of her falls and injuries, and generally due to declining health, [name] is currently requiring 24/7 care which is temporarily being provided by her granddaughter, who has moved in with her 73-year-old child in tow.  It is not a safe environment given pt's smoking around oxygen.  MSW Reece Levy met with the family today to discuss a number of options.  Unfortunately finances are the current barrier.

## 2022-12-02 NOTE — Assessment & Plan Note (Signed)
A1c has risen from 7.4 in 08/2022 to 7.7 now.  She has been off of her usual regimen which has just recently been resumed after hospitalization and SNF stay.  She has had access to more frequent meals in the interim and her granddaughter is currently staying with her and preparing food.  Sweets remain a craving, including ice cream and fruit smoothies.  No changes in regimen now that she is just now resuming previous dosing.  She has lost some weight, which may or may not be attributable to her Ozempic.

## 2022-12-02 NOTE — Assessment & Plan Note (Signed)
Cognitive impairment remains mild based on absence of deficit in function due to cognition.  Her medications, disrupted sleep pattern, ongoing tobacco use, known cerebrovascular disease, and depressed and anxious mood are all important contributors.  The most important intervention is actually addressing her long-term opioid and benzodiazepine use.  Long conversation today about the necessity to taper these.  Will begin with her alprazolam to be reduced to 0.5 mg nightly only.  No refill made, as she has a large supply at home.  Her hydrocodone will be dispensed to be taken no more than 3/day.

## 2022-12-02 NOTE — Assessment & Plan Note (Signed)
Problematic medications include gabapentin (currently prescribed at 300 mg tabs 2 p.o. nightly, which she had stopped prior to her hospitalizations but which has been resumed); hydrocodone; and alprazolam.  She is also on duloxetine 60 mg twice daily which is a very high dose and which could certainly be interfering with her sleep.  Ideally she would be in a controlled environment where medicines could be prescribed appropriately to get her through a taper and avoid withdrawals.  Discussed starting with taper of alprazolam to be taken only 0.5 mg nightly (she is accustomed to taking 0.5 twice daily and sometimes 1 mg at night).  She has a large supply of this at home and no refills will be made.

## 2022-12-02 NOTE — Assessment & Plan Note (Signed)
During hospitalization and surgical recovery, her warfarin was bridged with she has just resumed her warfarin.  This is managed by Dr. Alexandria Lodge.

## 2022-12-02 NOTE — Assessment & Plan Note (Signed)
Hematology referral placed in August 2024 has been closed without being scheduled.  I will check into this and resubmit an order if necessary.  Most recent hemoglobin 10.2 with MCV 88.5 and RDW 19.1 on 11/19/2022 which is stable.

## 2022-12-02 NOTE — Assessment & Plan Note (Signed)
>>  ASSESSMENT AND PLAN FOR HISTORY OF ATRIAL FIBRILLATION WRITTEN ON 12/02/2022  4:07 PM BY Derry Kassel ANNE, MD  Currently heart auscultates irreg irreg. Rate controlled.  Anticoagulated.  Asymptomatic.

## 2022-12-02 NOTE — Assessment & Plan Note (Signed)
Daily Linzess currently functioning well.  Therapeutic goal achieved, continue for now.

## 2022-12-02 NOTE — Assessment & Plan Note (Signed)
Down to a few pills remaining.  Refill provided allowing for no more than 3 tabs daily, and no more than 10 mg at a time.  Her pain has never been controlled on any regimen of opioid.  She is at high risk given combination of opioid and benzo, which we are addressing.

## 2022-12-03 ENCOUNTER — Other Ambulatory Visit: Payer: Self-pay | Admitting: Internal Medicine

## 2022-12-03 ENCOUNTER — Ambulatory Visit: Payer: Self-pay | Admitting: *Deleted

## 2022-12-03 ENCOUNTER — Encounter: Payer: Self-pay | Admitting: *Deleted

## 2022-12-03 DIAGNOSIS — D509 Iron deficiency anemia, unspecified: Secondary | ICD-10-CM

## 2022-12-03 NOTE — Patient Instructions (Signed)
Visit Information  Thank you for taking time to visit with me today. Please don't hesitate to contact me if I can be of assistance to you.   Following are the goals we discussed today:   Goals Addressed             This Visit's Progress    Provide support, resources and mental health support       Activities and task to complete in order to accomplish goals.    Expect phone call from [facility] to begin PT, OT  Consider Life Alert devices for home use/safety  Continue working with the [facility] team on plans to move forward with home repairs/adaptations to be made- make CHS aware of questions/needs Continue with family support- granddaughter and her child staying and helping out Seek in-home caregiver support to aide in care needs at home Try listening to music/tv, coloring or other activities to help distract you from feelings of worry, sadness, etc. Call insurance Concierge and inquire about copay for counseling- I am happy to help connect you if agreeable- Consider follow up with PAP[employer] PALS program for in-home help/        Our next appointment is by telephone on 12/10/22  Please call the care guide team at [phone] if you need to cancel or reschedule your appointment.   If you are experiencing a Mental Health or Behavioral Health Crisis or need someone to talk to, please call the Suicide and Crisis Lifeline: 988 call 911   The patient verbalized understanding of instructions, educational materials, and care plan provided today and DECLINED offer to receive copy of patient instructions, educational materials, and care plan.   Telephone follow up appointment with care management team member scheduled for:12/10/22  Reece Levy, MSW, LCSW [facility]  [facility], [facility] Licensed Clinical Social Worker Care Coordinator  [phone]

## 2022-12-03 NOTE — Patient Outreach (Addendum)
Care Coordination   In Person Provider Office Visit Note  Late Entry 12/02/22 12/03/2022 Name: [name] MRN: [id number] DOB: 06/24/1950  [name] is a 72 y.o. year old female who sees [name], [name], MD for primary care. I engaged with [name] in the providers office today.  What matters to the patients health and wellness today?  Home from rehab/SNF and needing help at home with long term planning.    Goals Addressed             This Visit's Progress    Provide support, resources and mental health support       Activities and task to complete in order to accomplish goals.    Expect phone call from [facility] to begin PT, OT  Consider Life Alert devices for home use/safety  Continue working with the [facility] team on plans to move forward with home repairs/adaptations to be made- make CHS aware of questions/needs Continue with family support- granddaughter and her child staying and helping out Seek in-home caregiver support to aide in care needs at home Try listening to music/tv, coloring or other activities to help distract you from feelings of worry, sadness, etc. Call insurance Concierge and inquire about copay for counseling- I am happy to help connect you if agreeable- Consider follow up with [employer] PALS program for in-home help/        SDOH assessments and interventions completed:  Yes     Care Coordination Interventions:  Yes, provided  Interventions Today    Flowsheet Row Most Recent Value  Chronic Disease   Chronic disease during today's visit Other  [s/p hip fx]  General Interventions   General Interventions Discussed/Reviewed General Interventions Discussed, General Interventions Reviewed, [employer], Doctor Visits, Level of Care, Communication with, Horticulturist, commercial (DME)  Doctor Visits Discussed/Reviewed Doctor Visits Discussed  [Pt at PCP visit today. Planning  to see Ortho for f/u on hip and right wrist 12/05/22.]  Durable Medical Equipment (DME) Wheelchair  [Pt has decided to not get a wheelchair at this time- will use her Rollator Walker(and the seat PRN). Her granddaughter will call DME company (# on the oxygen tank) to request a technician come to home to assess equipment is working and/or new tank/etc]  Level of Care Assisted Living, Skilled Nursing Facility, Personal Care Services  [Pt is currently not receiving Medicaid and her eligbility is uncertain. Discussed ALF, Medicaid PCS and other levels of care, coverage/payor, etc with pt and family. As well, private pay options and PAPA PALS benefit through [employer]]  Exercise Interventions   Exercise Discussed/Reviewed Physical Activity  [Pt has been moving minimally since returning home from SNF. Encouraged her to participate in HHPT and OT which will begin soon (pt had declined per [facility] and PC has sent new orders today)]  Physical Activity Discussed/Reviewed Physical Activity Discussed  Education Interventions   Education Provided Provided Education  Provided Verbal Education On Mental Health/Coping with Illness, Exercise, Applications, [employer], Insurance Plans  [Discussed Medicaid (will ask legal aid about her possible eligibility), [facility]TA benefits, HH services and depression/coping with pt.]  Mental Health Interventions   Mental Health Discussed/Reviewed Mental Health Discussed, Coping Strategies, Mental Health Reviewed  [Pt admits to "maybe" being depressed. Family feels her RX's are duplicated for pain, depression, her gut,etc and PCP to further assess and detemine treatment plans]  Pharmacy Interventions   Pharmacy Dicussed/Reviewed Pharmacy Topics Reviewed, Pharmacy Topics Discussed  [PCP advised of  the "multiple medications for her gut, pain and depression" as reported by family]  Safety Interventions   Safety Discussed/Reviewed Safety Discussed, Home Safety  [Pt has family providing  24/7 care at this time,  however, this is temporary and a long term plan for her safety needs to be identified. Suggested pt get a life alert button to wear for when she is home alone in the days ahead-]  Home Safety Assistive Devices  [Will provide resources for life alert devices]       Follow up plan: Follow up call scheduled for 12/05/22    Encounter Outcome:  Patient Visit Completed

## 2022-12-03 NOTE — Patient Instructions (Signed)
Visit Information  Thank you for taking time to visit with me today. Please don't hesitate to contact me if I can be of assistance to you.   Following are the goals we discussed today:   Goals Addressed             This Visit's Progress    Provide support, resources and mental health support       Activities and task to complete in order to accomplish goals.    Expect phone call from [facility] to begin PT, OT  Consider Life Alert devices for home use/safety (mailing you some info to review)  Continue working with the [facility] team on plans to move forward with home repairs/adaptations to be made- make CHS aware of questions/needs Continue with family support- granddaughter and her child staying and helping out Seek in-home caregiver support to aide in care needs at home Try listening to music/tv, coloring or other activities to help distract you from feelings of worry, sadness, etc. Call insurance Concierge and inquire about copay for counseling- I am happy to help connect you if agreeable- Consider follow up with P[employer] PALS program for in-home help/        Our next appointment is by telephone on 12/08/22  Please call the care guide team at [phone] if you need to cancel or reschedule your appointment.   If you are experiencing a Mental Health or Behavioral Health Crisis or need someone to talk to, please call the Suicide and Crisis Lifeline: 988 call 911   The patient verbalized understanding of instructions, educational materials, and care plan provided today and DECLINED offer to receive copy of patient instructions, educational materials, and care plan.   Telephone follow up appointment with care management team member scheduled for:  12/08/22  Reece Levy, MSW, LCSW [place]  [facility], [facility] Licensed Clinical Social Worker Care Coordinator  [phone]

## 2022-12-04 ENCOUNTER — Telehealth: Payer: Self-pay

## 2022-12-04 DIAGNOSIS — Z794 Long term (current) use of insulin: Secondary | ICD-10-CM | POA: Diagnosis not present

## 2022-12-04 DIAGNOSIS — S52501D Unspecified fracture of the lower end of right radius, subsequent encounter for closed fracture with routine healing: Secondary | ICD-10-CM | POA: Diagnosis not present

## 2022-12-04 DIAGNOSIS — J9601 Acute respiratory failure with hypoxia: Secondary | ICD-10-CM | POA: Diagnosis not present

## 2022-12-04 DIAGNOSIS — Z9981 Dependence on supplemental oxygen: Secondary | ICD-10-CM | POA: Diagnosis not present

## 2022-12-04 DIAGNOSIS — I5032 Chronic diastolic (congestive) heart failure: Secondary | ICD-10-CM | POA: Diagnosis not present

## 2022-12-04 DIAGNOSIS — S72142D Displaced intertrochanteric fracture of left femur, subsequent encounter for closed fracture with routine healing: Secondary | ICD-10-CM | POA: Diagnosis not present

## 2022-12-04 DIAGNOSIS — N182 Chronic kidney disease, stage 2 (mild): Secondary | ICD-10-CM | POA: Diagnosis not present

## 2022-12-04 DIAGNOSIS — I130 Hypertensive heart and chronic kidney disease with heart failure and stage 1 through stage 4 chronic kidney disease, or unspecified chronic kidney disease: Secondary | ICD-10-CM | POA: Diagnosis not present

## 2022-12-04 DIAGNOSIS — E1122 Type 2 diabetes mellitus with diabetic chronic kidney disease: Secondary | ICD-10-CM | POA: Diagnosis not present

## 2022-12-04 DIAGNOSIS — Z7985 Long-term (current) use of injectable non-insulin antidiabetic drugs: Secondary | ICD-10-CM | POA: Diagnosis not present

## 2022-12-04 LAB — BMP8+ANION GAP
Anion Gap: 17 mmol/L (ref 10.0–18.0)
BUN/Creatinine Ratio: 17 (ref 12–28)
BUN: 13 mg/dL (ref 8–27)
CO2: 25 mmol/L (ref 20–29)
Calcium: 9.8 mg/dL (ref 8.7–10.3)
Chloride: 98 mmol/L (ref 96–106)
Creatinine, Ser: 0.78 mg/dL (ref 0.57–1.00)
Glucose: 242 mg/dL — ABNORMAL HIGH (ref 70–99)
Potassium: 4 mmol/L (ref 3.5–5.2)
Sodium: 140 mmol/L (ref 134–144)
eGFR: 80 mL/min/{1.73_m2} (ref >59)

## 2022-12-04 LAB — LIPID PANEL
Chol/HDL Ratio: 3.6 ratio (ref 0.0–4.4)
Cholesterol, Total: 118 mg/dL (ref 100–199)
HDL: 33 mg/dL — ABNORMAL LOW (ref >39)
LDL Chol Calc (NIH): 53 mg/dL (ref 0–99)
Triglycerides: 192 mg/dL — ABNORMAL HIGH (ref 0–149)
VLDL Cholesterol Cal: 32 mg/dL (ref 5–40)

## 2022-12-04 LAB — VITAMIN B12: Vitamin B-12: 391 pg/mL (ref 232–1245)

## 2022-12-04 NOTE — Telephone Encounter (Signed)
Return call to [place] [facility] with [facility]. Requesting verbal orders "PT twice a week x 3 weeks then once a week x 3 weeks for gait training, balancing, and strengthening." VO given - sending to PCP for approval or denial.

## 2022-12-04 NOTE — Telephone Encounter (Signed)
[facility] is calling requesting verbal orders      Name : Sharyne Peach :[phone]

## 2022-12-05 ENCOUNTER — Encounter: Payer: Self-pay | Admitting: *Deleted

## 2022-12-05 DIAGNOSIS — S52501D Unspecified fracture of the lower end of right radius, subsequent encounter for closed fracture with routine healing: Secondary | ICD-10-CM | POA: Diagnosis not present

## 2022-12-05 DIAGNOSIS — S72142D Displaced intertrochanteric fracture of left femur, subsequent encounter for closed fracture with routine healing: Secondary | ICD-10-CM | POA: Diagnosis not present

## 2022-12-08 ENCOUNTER — Ambulatory Visit: Payer: Self-pay | Admitting: *Deleted

## 2022-12-09 NOTE — Patient Instructions (Signed)
Visit Information  Thank you for taking time to visit with me today. Please don't hesitate to contact me if I can be of assistance to you.   Following are the goals we discussed today:   Goals Addressed             This Visit's Progress    Provide support, resources and mental health support       Activities and task to complete in order to accomplish goals.    Continue with [facility] for PT, OT  Consider Life Alert devices for home use/safety (mailing you some info to review)  Continue working with the [facility]ging [facility] team on plans to move forward with home repairs/adaptations to be made- make CHS aware of questions/needs Continue with family support- granddaughter and her child staying and helping out Seek in-home caregiver support to aide in care needs at home Try listening to music/tv, coloring or other activities to help distract you from feelings of worry, sadness, etc. Call insurance Concierge and inquire about copay for counseling- I am happy to help connect you if agreeable- Consider follow up with [employer] PALS program for in-home help        Our next appointment is by telephone on 12/19/22  Please call the care guide team at [phone] if you need to cancel or reschedule your appointment.   If you are experiencing a Mental Health or Behavioral Health Crisis or need someone to talk to, please call the Suicide and Crisis Lifeline: 988 call 911   The patient verbalized understanding of instructions, educational materials, and care plan provided today and DECLINED offer to receive copy of patient instructions, educational materials, and care plan.   Telephone follow up appointment with care management team member scheduled for: 12/19/22  Reece Levy, MSW, LCSW [place]  [facility], [facility] Licensed Clinical Social Worker Care Coordinator  [phone]

## 2022-12-09 NOTE — Patient Outreach (Signed)
Care Coordination   Follow Up Visit Note   12/09/2022 Name: [name] MRN: [id number] DOB: 05-20-1950  [name] is a 72 y.o. year old female who sees [name], [name], MD for primary care. I spoke with  [name] by phone today.  What matters to the patients health and wellness today?  Can't access MYCHART. Getting HHPT    Goals Addressed             This Visit's Progress    Provide support, resources and mental health support       Activities and task to complete in order to accomplish goals.    [facility] with [facility] for PT, OT  Consider Life Alert devices for home use/safety (mailing you some info to review)  Continue working with the Aging [facility]fully[facility] team on plans to move forward with home repairs/adaptations to be made- make CHS aware of questions/needs Continue with family support- granddaughter and her child staying and helping out Seek in-home caregiver support to aide in care needs at home Try listening to music/tv, coloring or other activities to help distract you from feelings of worry, sadness, etc. Call insurance Concierge and inquire about copay for counseling- I am happy to help connect you if agreeable- Consider follow up with [employer] PALS program for in-home help        SDOH assessments and interventions completed:  Yes     Care Coordination Interventions:  Yes, provided  Interventions Today    Flowsheet Row Most Recent Value  Chronic Disease   Chronic disease during today's visit Other  [s/p hip fx]  General Interventions   General Interventions Discussed/Reviewed [employer], General Interventions Discussed  [Pt receiving HH PT/OT.]  Exercise Interventions   Physical Activity Discussed/Reviewed Physical Activity Discussed  [Pt is receiving HHPT and OT- has had one session she reports. Balance poor- encouraged pt to return HH call to schedule next visit and to work with them  as much as possible to improve her strength, balance, mobility, etc]  Mental Health Interventions   Mental Health Discussed/Reviewed Mental Health Discussed, Coping Strategies  [Pt voices no concerns related to mental health today.]  Safety Interventions   Safety Discussed/Reviewed Safety Discussed  [Pt continues to have family support in the home but temporarily-]       Follow up plan: Follow up call scheduled for 11/8/ 24  Encounter Outcome:  Patient Visit Completed

## 2022-12-10 ENCOUNTER — Encounter: Payer: PPO | Admitting: Student

## 2022-12-10 ENCOUNTER — Ambulatory Visit (HOSPITAL_BASED_OUTPATIENT_CLINIC_OR_DEPARTMENT_OTHER): Payer: PPO | Admitting: Pulmonary Disease

## 2022-12-11 ENCOUNTER — Ambulatory Visit (HOSPITAL_BASED_OUTPATIENT_CLINIC_OR_DEPARTMENT_OTHER): Payer: PPO | Admitting: Pulmonary Disease

## 2022-12-11 ENCOUNTER — Encounter (HOSPITAL_BASED_OUTPATIENT_CLINIC_OR_DEPARTMENT_OTHER): Payer: Self-pay | Admitting: Pulmonary Disease

## 2022-12-11 VITALS — BP 102/60 | HR 62 | Resp 20 | Ht 62.0 in | Wt 145.7 lb

## 2022-12-11 DIAGNOSIS — G4734 Idiopathic sleep related nonobstructive alveolar hypoventilation: Secondary | ICD-10-CM

## 2022-12-11 DIAGNOSIS — J449 Chronic obstructive pulmonary disease, unspecified: Secondary | ICD-10-CM

## 2022-12-11 NOTE — Assessment & Plan Note (Signed)
Continue 2 L of oxygen and into CPAP machine.  Even if she does not use her CPAP that would be okay but she does need nocturnal oxygen

## 2022-12-11 NOTE — Progress Notes (Signed)
Subjective:    Patient ID: [name], female    DOB: 02/07/1951, 72 y.o.   MRN: [id number]  HPI  72 year old heavy smoker with COPD and pulmonary hypertension. She used to follow with Dr. Maple Hudson and presents to establish with me today  PMH - AVM GI tract,  mitral stenosis s/p bioprosthetic MV replacement, paroxysmal atrial fibrillation, GI bleeding, and pulmonary hypertension/RV failure -WHO 2/3 HFpEF  DM2, Hx Renal Carcinoma  PAD  Previously noted to have mild OSA and has been maintained on CPAP for many years. She did not qualify based on more recent sleep studies but somehow obtained a CPAP.  She has oxygen blended into CPAP during night but this is not working currently. She is maintained on Trelegy.  She does not like the albuterol MDI that she has because this is too big for her.  She does not have a spacer.  She continues to smoke a pack per day and is not interested in quitting. Previous cardiology consultation reviewed. She recently had a hip fracture.  She recovered from this and the left right forearm fracture which is in a cast.  She is undergoing home health PT  CT angiogram chest 10/2022 shows diffuse infiltrates likely interstitial edema and small right effusion She is maintained on Lasix 80 mg in the morning  Chest x-ray 11/19/2022 shows resolved effusion  Significant tests/ events reviewed  NPSG at [facility] 01/18/03- AHI 8.6/ hr -Nocturnal polysomnography (06/2009): Moderate sleep apnea/ hypopnea syndrome , AHI 17.8 per hour with nonpositional hypopneas. CPAP titration to 12 CWP, AHI 2.4 per hour.Body weight 183 lb NPSG 07/22/19- AHI 0.0/ htr, desat to 83%, with mean 93% after adding O2 2L, body weight 170 lbs NPSG 04/10/21-AHI 0 / hr, desaturation to 83%/ needed supplemental O2, body weight 164 lbs  PFT 04/23/16-  severe obstr F/F 0.69, FEV1 1.05/ 48%, insig resp to BD, TLC 76%, DLCO 52%  Past Medical History:  Diagnosis Date   Abnormality of lung  on CXR 02/02/2020   Nonspecific finding on CXR ordered by pulmonologist - c/w inflammation vs infection, f/u imaging suggested, Dr. Roxy Cedar office has been in communication about recommended next steps.   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   Non-bleeding when visualized on capsule endoscopy 06/30/2015    Arthritis    "lower back; hands" (02/19/2018)   Asthma    Asymptomatic cholelithiasis 09/25/2015   Seen on CT scan 08/2015   Carotid artery stenosis; s/p R endarterectomy    s/p right endarterectomy (06/2010) Carotid US (07/2010):  Left: Moderate-to-severe (60-79%) calcific and non-calcific plaque origin and proximal ICA and ECA    Chronic congestive heart failure with left ventricular diastolic dysfunction (HCC) 10/21/2010   Chronic constipation 02/03/2011   Chronic daily headache 01/16/2014   Chronic iron deficiency anemia    Chronic low back pain 10/06/2012   Chronic venous insufficiency 08/04/2012   Closed fracture of one rib of left side 08/23/2021   ED visit after a fall 08/16/21    COPD (chronic obstructive pulmonary disease) with emphysema (HCC)    PFTs 2018: severe obstructive disease, insignif response to bronchiodilator, mild restriction parenchymal pattern, moderately severe diffusion defect. 2014  FEV1 0.92 (40%), ratio 69, 27% increase in FEV1 with BD, TLC 91%, severe airtrapping, DLCO49% On chronic home O2. Pulmonary rehab referral 05/2012    Dyspnea    Fibromyalgia 08/29/2010  Gastroesophageal reflux disease    History of blood transfusion    "several times"  (02/19/2018)   History of clear cell renal cell carcinoma (HCC), in remission 07/21/2011   s/p cryoablation of left RCC in 09/2011 by Dr. Fredia Sorrow. Followed by Dr. Retta Diones  [facility]) .     History of fracture of left hip 10/17/2022   History of hiatal hernia    History of  mitral valve replacement with bioprosthetic valve due to mitral stenosis 2012   s/p MVR with a 27-mm pericardial porcine valve (Medtronic Mosaic valve, serial #[id number] on 09/20/10, Dr. Donata Clay)    History of obstructive sleep apnea, resolved 2013   resolved per sleep study 07/2019; no apnea, but did have desaturation.  CPAP no longer necessary.  Nocturnal polysomnography (06/2009): Moderate sleep apnea/ hypopnea syndrome , AHI 17.8 per hour with nonpositional hypopneas. CPAP titration to 12 CWP, AHI 2.4 per hour. On nocturnal CPAP via a small resMed Quattro full-face mask with heated humidifier.    History of pneumonia    "once"  (02/19/2018)   History of seborrheic keratosis 09/28/2015   History of stroke without residual deficits 01/17/2022   Hyperlipidemia LDL goal < 100 11/20/2005   Internal and external hemorrhoids without complication 08/04/2012   Lesion of left native kidney 06/01/2020   Incidental finding on recent screening chest CT for lung ca ordered by Dr. Maple Hudson pulmonology - he has ordered a f/u renal U/S.    Lichen sclerosus of female genitalia 01/12/2017   Migraine    "none in years" (02/19/2018)   Mild cognitive impairment with memory loss 12/23/2021   Moderately severe major depression (HCC) 11/19/2005   Nocturnal hypoxia per sleep study 07/2019    Osteoporosis    DEXA 2016: T -2.7; DEXA (12/09/2011): L-spine T -3.7, left hip T -1.4 DEXA (12/2004): L-spine T -2.6, left hip -0.1    Paroxysmal atrial fibrillation (HCC) 10/22/2010   s/p Left atrial maze procedure for paroxysmal atrial fibrillation on 09/20/2010 by Dr Donata Clay.  Subsequent splenic infarct, decision was made to re-anticoagulate with coumadin, likely life-long as this is the most likely cause of the splenic infarct.    Personal history of colonic polyps 05/14/2011   Colonoscopy (05/2011): 4 mm adenomatous polyp excised endoscopically Colonoscopy (02/2002): Adenomatous polyp excised endoscopically    Personal history  of renal cell carcinoma 09/12/2020   Pneumonia    Pulmonary hypertension due to chronic obstructive pulmonary disease (HCC) 04/25/2016   2014 TEE w PA peak pressure 46 mmHg, s/p MV replacement    Right nephrolithiasis, asymptomatic, incidental finding 09/06/2014   5 mm non-obstructing calculus seen on CT scan 09/05/2014    Right ventricular failure (HCC) 04/25/2016   Severe obesity (BMI 35.0-39.9) with comorbidity (HCC) 10/23/2011   Sleep apnea    Stage 2 chronic kidney disease due to type 2 diabetes mellitus (HCC) 12/16/2018   CKD stage III     Tobacco abuse 07/28/2012   Type 2 diabetes mellitus with diabetic neuropathy (HCC)      Review of Systems Shortness of breath Arm pain  Constitutional: negative for anorexia, fevers and sweats  Eyes: negative for irritation, redness and visual disturbance  Ears, nose, mouth, throat, and face: negative for earaches, epistaxis, nasal congestion and sore throat  Cardiovascular: negative for chest pain, lower extremity edema, orthopnea, palpitations and syncope  Gastrointestinal: negative for abdominal pain, constipation, diarrhea, melena, nausea and vomiting  Genitourinary:negative for dysuria, frequency and hematuria  Hematologic/lymphatic: negative for bleeding, easy bruising  and lymphadenopathy  Musculoskeletal:negative for arthralgias, muscle weakness and stiff joints  Neurological: negative for coordination problems, gait problems, headaches and weakness  Endocrine: negative for diabetic symptoms including polydipsia, polyuria and weight loss     Objective:   Physical Exam  Gen. Pleasant, obese, in no distress, normal affect ENT - no pallor,icterus, no post nasal drip, class 2 airway Neck: No JVD, no thyromegaly, no carotid bruits Lungs: no use of accessory muscles, kyphotic, no dullness to percussion, decreased without rales or rhonchi  Cardiovascular: Rhythm regular, heart sounds  normal, no murmurs or gallops, no peripheral  edema Abdomen: soft and non-tender, no hepatosplenomegaly, BS normal. Musculoskeletal: No deformities, no cyanosis or clubbing Neuro:  alert, non focal, no tremors       Assessment & Plan:

## 2022-12-11 NOTE — Patient Instructions (Addendum)
Continue on Trelegy once daily.  X Refills on albuterol MDI  x spacer x 1  Call as needed.  We can consider pulmonary rehab in the future

## 2022-12-11 NOTE — Assessment & Plan Note (Signed)
Continue Trelegy which would be her maintenance therapy. Will provide her with a new albuterol inhaler which is easier for her to use.  Will also provide her with a spacer. She does have albuterol nebs which she uses rarely. Once she recovers from her current mobility issues, we will consider enrolling her in pulmonary rehab program We discussed COPD action plan and signs and symptoms of COPD exacerbation Her vaccinations are up-to-date

## 2022-12-13 ENCOUNTER — Other Ambulatory Visit (HOSPITAL_COMMUNITY): Payer: Self-pay

## 2022-12-13 ENCOUNTER — Other Ambulatory Visit: Payer: Self-pay | Admitting: Internal Medicine

## 2022-12-13 DIAGNOSIS — K5909 Other constipation: Secondary | ICD-10-CM

## 2022-12-15 ENCOUNTER — Other Ambulatory Visit (HOSPITAL_COMMUNITY): Payer: Self-pay

## 2022-12-15 ENCOUNTER — Telehealth: Payer: Self-pay | Admitting: *Deleted

## 2022-12-15 MED ORDER — LINACLOTIDE 72 MCG PO CAPS
72.0000 ug | ORAL_CAPSULE | Freq: Every day | ORAL | 3 refills | Status: DC
  Filled 2022-12-15: qty 90, 90d supply, fill #0
  Filled 2023-03-07: qty 90, 90d supply, fill #1
  Filled 2023-05-26: qty 90, 90d supply, fill #2
  Filled 2023-09-13: qty 90, 90d supply, fill #3

## 2022-12-15 NOTE — Telephone Encounter (Signed)
Call from [place] [place] [facility].  Patient fell over weekend.  Got up to go to refrigerator fell over chair.. Crawled back to Living room to get up on another chair.  Fell on hip that had previously been broken.  Is hurting from fall.  Pulse is 46 B/P is 122/52.  Alert and oriented and laughing

## 2022-12-16 ENCOUNTER — Other Ambulatory Visit (HOSPITAL_COMMUNITY): Payer: Self-pay

## 2022-12-16 DIAGNOSIS — Z7901 Long term (current) use of anticoagulants: Secondary | ICD-10-CM | POA: Diagnosis not present

## 2022-12-16 DIAGNOSIS — I480 Paroxysmal atrial fibrillation: Secondary | ICD-10-CM | POA: Diagnosis not present

## 2022-12-17 ENCOUNTER — Telehealth: Payer: Self-pay | Admitting: *Deleted

## 2022-12-17 NOTE — Telephone Encounter (Signed)
Call from [facility].  Stes patient's B/P today was 110/40.  Pulse was 66.  Complaints of increased pain on left hip to 8/10.  Refuses to come to the ER stating no one to bring her.  Wants to await appointment next week with PCP.

## 2022-12-18 ENCOUNTER — Inpatient Hospital Stay: Payer: PPO

## 2022-12-18 ENCOUNTER — Inpatient Hospital Stay: Payer: PPO | Attending: Hematology and Oncology | Admitting: Hematology

## 2022-12-18 VITALS — BP 120/56 | HR 63 | Temp 97.5°F | Resp 18 | Wt 142.3 lb

## 2022-12-18 DIAGNOSIS — I480 Paroxysmal atrial fibrillation: Secondary | ICD-10-CM | POA: Diagnosis not present

## 2022-12-18 DIAGNOSIS — Z7901 Long term (current) use of anticoagulants: Secondary | ICD-10-CM | POA: Diagnosis not present

## 2022-12-18 DIAGNOSIS — D649 Anemia, unspecified: Secondary | ICD-10-CM

## 2022-12-18 DIAGNOSIS — C649 Malignant neoplasm of unspecified kidney, except renal pelvis: Secondary | ICD-10-CM

## 2022-12-18 DIAGNOSIS — Z85528 Personal history of other malignant neoplasm of kidney: Secondary | ICD-10-CM | POA: Insufficient documentation

## 2022-12-18 DIAGNOSIS — D509 Iron deficiency anemia, unspecified: Secondary | ICD-10-CM | POA: Diagnosis not present

## 2022-12-18 LAB — CBC WITH DIFFERENTIAL (CANCER CENTER ONLY)
Abs Immature Granulocytes: 0.03 10*3/uL (ref 0.00–0.07)
Basophils Absolute: 0 10*3/uL (ref 0.0–0.1)
Basophils Relative: 1 %
Eosinophils Absolute: 0.1 10*3/uL (ref 0.0–0.5)
Eosinophils Relative: 1 %
HCT: 36.7 % (ref 36.0–46.0)
Hemoglobin: 10.7 g/dL — ABNORMAL LOW (ref 12.0–15.0)
Immature Granulocytes: 0 %
Lymphocytes Relative: 16 %
Lymphs Abs: 1.3 10*3/uL (ref 0.7–4.0)
MCH: 23.7 pg — ABNORMAL LOW (ref 26.0–34.0)
MCHC: 29.2 g/dL — ABNORMAL LOW (ref 30.0–36.0)
MCV: 81.4 fL (ref 80.0–100.0)
Monocytes Absolute: 0.6 10*3/uL (ref 0.1–1.0)
Monocytes Relative: 8 %
Neutro Abs: 5.9 10*3/uL (ref 1.7–7.7)
Neutrophils Relative %: 74 %
Platelet Count: 246 10*3/uL (ref 150–400)
RBC: 4.51 MIL/uL (ref 3.87–5.11)
RDW: 17.8 % — ABNORMAL HIGH (ref 11.5–15.5)
WBC Count: 7.9 10*3/uL (ref 4.0–10.5)
nRBC: 0 % (ref 0.0–0.2)

## 2022-12-18 LAB — CMP (CANCER CENTER ONLY)
ALT: 8 U/L (ref 0–44)
AST: 13 U/L — ABNORMAL LOW (ref 15–41)
Albumin: 4 g/dL (ref 3.5–5.0)
Alkaline Phosphatase: 69 U/L (ref 38–126)
Anion gap: 5 (ref 5–15)
BUN: 13 mg/dL (ref 8–23)
CO2: 32 mmol/L (ref 22–32)
Calcium: 10 mg/dL (ref 8.9–10.3)
Chloride: 102 mmol/L (ref 98–111)
Creatinine: 0.94 mg/dL (ref 0.44–1.00)
GFR, Estimated: >60 mL/min (ref >60)
Glucose, Bld: 87 mg/dL (ref 70–99)
Potassium: 4.6 mmol/L (ref 3.5–5.1)
Sodium: 139 mmol/L (ref 135–145)
Total Bilirubin: 0.5 mg/dL (ref <1.2)
Total Protein: 6.8 g/dL (ref 6.5–8.1)

## 2022-12-18 LAB — LACTATE DEHYDROGENASE: LDH: 179 U/L (ref 98–192)

## 2022-12-18 LAB — IRON AND IRON BINDING CAPACITY (CC-WL,HP ONLY)
Iron: 17 ug/dL — ABNORMAL LOW (ref 28–170)
Saturation Ratios: 4 % — ABNORMAL LOW (ref 10.4–31.8)
TIBC: 461 ug/dL — ABNORMAL HIGH (ref 250–450)
UIBC: 444 ug/dL — ABNORMAL HIGH (ref 148–442)

## 2022-12-18 LAB — FERRITIN: Ferritin: 11 ng/mL (ref 11–307)

## 2022-12-18 NOTE — Progress Notes (Signed)
HEMATOLOGY/ONCOLOGY CONSULTATION NOTE  Date of Service: 12/18/2022  Patient Care Team: Miguel Aschoff, MD as PCP - General (Internal Medicine) Laurey Morale, MD as PCP - Advanced Heart Failure (Cardiology) Plyler, Cecil Cranker, RD as Dietitian (Dietician) Ernesto Rutherford, MD as Consulting Physician (Ophthalmology) Dorena Cookey, MD (Inactive) as Consulting Physician (Gastroenterology) (Rounding), Imts Leitha Bleak, MD as Attending Physician Buck Mam, LCSW as Social Worker [name], [place] as Occupational Therapist (Occupational Therapy)  CHIEF COMPLAINTS/PURPOSE OF CONSULTATION:  evaluation and management of chronic iron deficiency anemia.  HISTORY OF PRESENTING ILLNESS:  [name] is a wonderful 72 y.o. female who is here for evaluation and management of chronic iron deficiency anemia.  Today, she is accompanied by her sister and presents in a wheelchair. Patient reports that she was seen by a different hematologist previously several years ago. Patient reports having anemia all her life. She reports that her hgb does go down multiple times, requiring her to present to the hospital to receive blood and iron.  Patient denies any concerns for bleeding, such as nose bleeds, gum bleeds, black stools,or blood in the stools.   She reports having multiple falls with balance issues and reports a fractured right wrist and hip. She reports that her hip had contact with a chair and she could barely walk for some time.  She fractured her hip in September and did have hip surgery. Unfortunately, while in a nursing home for rehab, she suffered a fall and fractured her right wrist. Patient reports that she was told to see a neurologist to evaluate her balance issues/falls and she is working on scheduling an appt. Her sister reports that patient has had balance issues for a while.  She reports that she felt that she was over-medicated white being treated in the nursing  facility following her hip surgery. She describes symptoms including paleness, lethargy, and confusion.   Her sister reports that patient continues to smoke more than a pack a day.  Patient reports a history of kidney cancer diagnosed 8-10 years ago. She reports that she had her first Cryoablation at that time and her second one five years later by Dr. Fredia Sorrow. Patient reports that she required a shot every month. She does not follow a Insurance underwriter. She follows with an interventional radiologist.    Patient reports that she was previously told that she did not need to use a CPAP machine 6 months ago. She was told that she does not have sleep apnea. She generally uses at-home oxygen only when she uses her CPAP machine, which she typically uses everytime she sleeps. However, patient has issues with CPAP set up and has not used the CPAP machine recently.   She reports a hx of heart surgery. She is on coumadin chronically for afib.   Patient has required blood transfusions twice a year over the last 5-7 years. Patient has not received erythropoietin shots recently. Over the last 5-7 years, her anemia has been managed by her PCP.   She reports that she was told by hospital staff that she cannot receive iron infusions due to reactions. She had iron infusions 2-3 times in a hospital setting and not on an elective basis as outpatient. With her reaction to IV iron, her blood pressure dropped and she was given benedryl after the reaction.   Patient does take Prilosec.  She reports that she has lost 55 pounds in 2-3 years.   She denies any other changes over the last  6 months.   She reports that she lives at home at this time.  MEDICAL HISTORY:  Past Medical History:  Diagnosis Date   Abnormality of lung on CXR 02/02/2020   Nonspecific finding on CXR ordered by pulmonologist - c/w inflammation vs infection, f/u imaging suggested, Dr. Roxy Cedar office has been in communication about recommended next steps.    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   Non-bleeding when visualized on capsule endoscopy 06/30/2015    Arthritis    "lower back; hands" (02/19/2018)   Asthma    Asymptomatic cholelithiasis 09/25/2015   Seen on CT scan 08/2015   Carotid artery stenosis; s/p R endarterectomy    s/p right endarterectomy (06/2010) Carotid US (07/2010):  Left: Moderate-to-severe (60-79%) calcific and non-calcific plaque origin and proximal ICA and ECA    Chronic congestive heart failure with left ventricular diastolic dysfunction (HCC) 10/21/2010   Chronic constipation 02/03/2011   Chronic daily headache 01/16/2014   Chronic iron deficiency anemia    Chronic low back pain 10/06/2012   Chronic venous insufficiency 08/04/2012   Closed fracture of one rib of left side 08/23/2021   ED visit after a fall 08/16/21    COPD (chronic obstructive pulmonary disease) with emphysema (HCC)    PFTs 2018: severe obstructive disease, insignif response to bronchiodilator, mild restriction parenchymal pattern, moderately severe diffusion defect. 2014  FEV1 0.92 (40%), ratio 69, 27% increase in FEV1 with BD, TLC 91%, severe airtrapping, DLCO49% On chronic home O2. Pulmonary rehab referral 05/2012    Dyspnea    Fibromyalgia 08/29/2010   Gastroesophageal reflux disease    History of blood transfusion    "several times"  (02/19/2018)   History of clear cell renal cell carcinoma (HCC), in remission 07/21/2011   s/p cryoablation of left RCC in 09/2011 by Dr. Fredia Sorrow. Followed by Dr. Retta Diones  [facility]) .     History of fracture of left hip 10/17/2022   History of hiatal hernia    History of mitral valve replacement with bioprosthetic valve due to mitral stenosis 2012   s/p MVR with a 27-mm pericardial porcine valve (Medtronic Mosaic valve, serial #[id number] on 09/20/10, Dr. Donata Clay)     History of obstructive sleep apnea, resolved 2013   resolved per sleep study 07/2019; no apnea, but did have desaturation.  CPAP no longer necessary.  Nocturnal polysomnography (06/2009): Moderate sleep apnea/ hypopnea syndrome , AHI 17.8 per hour with nonpositional hypopneas. CPAP titration to 12 CWP, AHI 2.4 per hour. On nocturnal CPAP via a small resMed Quattro full-face mask with heated humidifier.    History of pneumonia    "once"  (02/19/2018)   History of seborrheic keratosis 09/28/2015   History of stroke without residual deficits 01/17/2022   Hyperlipidemia LDL goal < 100 11/20/2005   Internal and external hemorrhoids without complication 08/04/2012   Lesion of left native kidney 06/01/2020   Incidental finding on recent screening chest CT for lung ca ordered by Dr. Maple Hudson pulmonology - he has ordered a f/u renal U/S.    Lichen sclerosus of female genitalia 01/12/2017   Migraine    "none in years" (02/19/2018)   Mild cognitive impairment with memory loss 12/23/2021   Moderately severe major depression (HCC) 11/19/2005   Nocturnal hypoxia per sleep study 07/2019    Osteoporosis    DEXA 2016: T -2.7; DEXA (12/09/2011): L-spine T -3.7, left hip T -1.4  DEXA (12/2004): L-spine T -2.6, left hip -0.1    Paroxysmal atrial fibrillation (HCC) 10/22/2010   s/p Left atrial maze procedure for paroxysmal atrial fibrillation on 09/20/2010 by Dr Donata Clay.  Subsequent splenic infarct, decision was made to re-anticoagulate with coumadin, likely life-long as this is the most likely cause of the splenic infarct.    Personal history of colonic polyps 05/14/2011   Colonoscopy (05/2011): 4 mm adenomatous polyp excised endoscopically Colonoscopy (02/2002): Adenomatous polyp excised endoscopically    Personal history of renal cell carcinoma 09/12/2020   Pneumonia    Pulmonary hypertension due to chronic obstructive pulmonary disease (HCC) 04/25/2016   2014 TEE w PA peak pressure 46 mmHg, s/p MV replacement     Right nephrolithiasis, asymptomatic, incidental finding 09/06/2014   5 mm non-obstructing calculus seen on CT scan 09/05/2014    Right ventricular failure (HCC) 04/25/2016   Severe obesity (BMI 35.0-39.9) with comorbidity (HCC) 10/23/2011   Sleep apnea    Stage 2 chronic kidney disease due to type 2 diabetes mellitus (HCC) 12/16/2018   CKD stage III     Tobacco abuse 07/28/2012   Type 2 diabetes mellitus with diabetic neuropathy [facility])     SURGICAL HISTORY: Past Surgical History:  Procedure Laterality Date   BIOPSY  07/19/2021   Procedure: BIOPSY;  Surgeon: Vida Rigger, MD;  Location: [facility] ENDOSCOPY;  Service: Gastroenterology;;   CARDIAC CATHETERIZATION     CARDIAC VALVE REPLACEMENT  Aug. 2012   "mitral valve"   CAROTID ENDARTERECTOMY Right 07/04/2010   by Dr. Myra Gianotti for asymptomatic right carotid artery stenosis   CATARACT EXTRACTION W/ INTRAOCULAR LENS  IMPLANT, BILATERAL Bilateral    CHEST TUBE INSERTION  09/24/2010   Dr Zenaida Niece Trigt   COLONOSCOPY  05/12/2011   performed by Dr. Bosie Clos. Showing small internal hemorrhoids, single tubular adenoma polyp   COLONOSCOPY WITH PROPOFOL N/A 05/29/2020   Procedure: COLONOSCOPY WITH PROPOFOL;  Surgeon: Vida Rigger, MD;  Location: [facility] ENDOSCOPY;  Service: Endoscopy;  Laterality: N/A;   COLONOSCOPY WITH PROPOFOL N/A 07/19/2021   Procedure: COLONOSCOPY WITH PROPOFOL;  Surgeon: Vida Rigger, MD;  Location: [facility];  Service: Gastroenterology;  Laterality: N/A;   CRYOABLATION Left 09/2011   by Dr. Fredia Sorrow. Followed by Dr. Retta Diones  [facility]) .     DILATION AND CURETTAGE OF UTERUS     ESOPHAGOGASTRODUODENOSCOPY  05/12/2011   performed by Dr. Bosie Clos. Negative for ulcerations, biopsy negative for evidence of celiac sprue   ESOPHAGOGASTRODUODENOSCOPY N/A 06/29/2015   Procedure: ESOPHAGOGASTRODUODENOSCOPY (EGD);  Surgeon: Vida Rigger, MD;  Location: [facility] ENDOSCOPY;  Service: Endoscopy;  Laterality: N/A;   ESOPHAGOGASTRODUODENOSCOPY N/A 03/29/2016    Procedure: ESOPHAGOGASTRODUODENOSCOPY (EGD);  Surgeon: Vida Rigger, MD;  Location: [facility] ENDOSCOPY;  Service: Endoscopy;  Laterality: N/A;   ESOPHAGOGASTRODUODENOSCOPY (EGD) WITH PROPOFOL N/A 05/29/2020   Procedure: ESOPHAGOGASTRODUODENOSCOPY (EGD) WITH PROPOFOL;  Surgeon: Vida Rigger, MD;  Location: [facility] END[facility]COPY;  Service: Endoscopy;  Laterality: N/A;   FRACTURE SURGERY     GIVENS CAPSULE STUDY N/A 06/30/2015   Procedure: GIVENS CAPSULE STUDY;  Surgeon: Vida Rigger, MD;  Location: [facility] ENDOSCOPY;  Service: Endoscopy;  Laterality: N/A;   GIVENS CAPSULE STUDY N/A 06/29/2015   Procedure: GIVENS CAPSULE STUDY;  Surgeon: Vida Rigger, MD;  Location: [facility] ENDOSCOPY;  Service: Endoscopy;  Laterality: N/A;   HEMORRHOID SURGERY  1970s?   "lanced"   HYSTEROSCOPY W/ ENDOMETRIAL ABLATION  06/2001   for persistent post-menopausal bleeding // by S. Kyra Manges, M.D.   INTRAMEDULLARY (IM) NAIL INTERTROCHANTERIC Left 10/19/2022  Procedure: LEFT INTRAMEDULLARY (IM) NAIL INTERTROCHANTERIC;  Surgeon: Luci Bank, MD;  Location: [facility] OR;  Service: Orthopedics;  Laterality: Left;   IR GENERIC HISTORICAL  08/23/2015   IR RADIOLOGIST EVAL & MGMT 08/23/2015 Irish Lack, MD [facility] INTERV RAD   IR GENERIC HISTORICAL  04/09/2016   IR RADIOLOGIST EVAL & MGMT 04/09/2016 Irish Lack, MD [facility] INTERV RAD   IR RADIOLOGIST EVAL & MGMT  10/07/2016   IR RADIOLOGIST EVAL & MGMT  06/25/2017   IR RADIOLOGIST EVAL & MGMT  07/31/2020   IR RADIOLOGIST EVAL & MGMT  10/02/2020   IR RADIOLOGIST EVAL & MGMT  12/20/2020   IR RADIOLOGIST EVAL & MGMT  12/25/2021   LEFT HEART CATH AND CORONARY ANGIOGRAPHY N/A 04/21/2016   Procedure: Left Heart Cath and Coronary Angiography;  Surgeon: Runell Gess, MD;  Location: [facility] INVASIVE CV LAB;  Service: Cardiovascular;  Laterality: N/A;   LIPOMA EXCISION  08/2005   occipital lipoma 1.5cm - by Dr. Maryagnes Amos   LITHOTRIPSY  ~ 2000   MAZE Left 09/20/10   for paroxysmal atrial fibrillation (Dr. Donata Clay)   MITRAL  VALVE REPLACEMENT  09/20/10    with a 27-mm pericardial porcine valve (Medtronic Mosaic valve, serial #[id number]). 09/20/10, Dr Donata Clay   ORIF CLAVICLE FRACTURE Right 01/2004   by Veverly Fells. Ophelia Charter, M.D for Right clavicle nonunion.; "it's got a pin in it"   POLYPECTOMY  05/29/2020   Procedure: POLYPECTOMY;  Surgeon: Vida Rigger, MD;  Location: [facility] ENDOSCOPY;  Service: Endoscopy;;   POLYPECTOMY  07/19/2021   Procedure: POLYPECTOMY;  Surgeon: Vida Rigger, MD;  Location: [facility] ENDOSCOPY;  Service: Gastroenterology;;   RADIOLOGY WITH ANESTHESIA Left 09/12/2020   Procedure: RADIOLOGY WITH ANESTHESIA- RENAL CRYOABLATION;  Surgeon: Irish Lack, MD;  Location: [facility] ORS;  Service: Radiology;  Laterality: Left;   REFRACTIVE SURGERY Bilateral    RIGHT HEART CATH N/A 04/23/2016   Procedure: Right Heart Cath;  Surgeon: Laurey Morale, MD;  Location: [facility] INVASIVE CV LAB;  Service: Cardiovascular;  Laterality: N/A;   TONSILLECTOMY     TUBAL LIGATION      SOCIAL HISTORY: Social History   Socioeconomic History   Marital status: Divorced    Spouse name: Not on file   Number of children: Not on file   Years of education: Not on file   Highest education level: Not on file  Occupational History   Not on file  Tobacco Use   Smoking status: Every Day    Current packs/day: 1.00    Average packs/day: 1 pack/day for 60.1 years (60.1 ttl pk-yrs)    Types: Cigarettes    Start date: 11/01/1962   Smokeless tobacco: Never   Tobacco comments:    At least 1 PPD  Vaping Use   Vaping status: Never Used  Substance and Sexual Activity   Alcohol use: Not Currently    Comment: 02/19/2018 "nothing since 1999"   Drug use: Not Currently    Types: Marijuana    Comment: 02/19/2018 "nothing since the 1990s"   Sexual activity: Not Currently    Birth control/protection: Post-menopausal  Other Topics Concern   Not on file  Social History Narrative   Lives [place] ([address])   Worked at [employer] for  18 years   No car   Social Determinants of Corporate investment banker Strain: Low Risk  (05/01/2022)   Overall Financial Resource Strain (CARDIA)    Difficulty of Paying Living Expenses: Not hard at  all  Food Insecurity: Food Insecurity Present (10/17/2022)   Hunger Vital Sign    Worried About Running Out of Food in the Last Year: Never true    Ran Out of Food in the Last Year: Sometimes true  Transportation Needs: Unmet Transportation Needs (10/17/2022)   PRAPARE - Transportation    Lack of Transportation (Medical): Yes    Lack of Transportation (Non-Medical): Yes  Physical Activity: Inactive (05/01/2022)   Exercise Vital Sign    Days of Exercise per Week: 0 days    Minutes of Exercise per Session: 0 min  Stress: Stress Concern Present (05/01/2022)   [employer] of Occupational Health - Occupational Stress Questionnaire    Feeling of Stress : Rather much  Social Connections: Socially Isolated (05/01/2022)   Social Connection and Isolation Panel [NHANES]    Frequency of Communication with Friends and Family: More than three times a week    Frequency of Social Gatherings with Friends and Family: More than three times a week    Attends Religious Services: Never    Database administrator or Organizations: No    Attends Banker Meetings: Never    Marital Status: Divorced  Catering manager Violence: Not At Risk (10/17/2022)   Humiliation, Afraid, Rape, and Kick questionnaire    Fear of Current or Ex-Partner: No    Emotionally Abused: No    Physically Abused: No    Sexually Abused: No    FAMILY HISTORY: Family History  Problem Relation Age of Onset   Peptic Ulcer Disease Father    Heart attack Father 50       Died of MI at age 98   Heart attack Brother 50       Died of MI at age 86   Obesity Brother    Pneumonia Mother    Healthy Sister    Lupus Daughter    Obsessive Compulsive Disorder Daughter     ALLERGIES:  is allergic to dexilant [dexlansoprazole],  lorazepam, oxycontin [oxycodone], and tramadol hcl.  MEDICATIONS:  Current Outpatient Medications  Medication Sig Dispense Refill   albuterol (PROAIR HFA) 108 (90 Base) MCG/ACT inhaler Inhale 2 puffs into the lungs every 6 (six) hours as needed for shortness of breath. 6.7 g 5   buPROPion (WELLBUTRIN XL) 150 MG 24 hr tablet Take 1 tablet (150 mg total) by mouth every morning. 90 tablet 3   cetirizine (ZYRTEC ALLERGY) 10 MG tablet Take 1 tablet (10 mg total) by mouth daily. 90 tablet 3   Cholecalciferol (VITAMIN D3) 1.25 MG (50000 UT) CAPS Take 1 capsule (50,000 Units total) by mouth once a week for 6 doses. 6 capsule 0   Continuous Glucose Sensor (FREESTYLE LIBRE 3 SENSOR) MISC Place 1 sensor on the skin every 14 days. Use to check glucose 6 times daily as directed 6 each 3   docusate sodium (COLACE) 100 MG capsule Take 1 capsule (100 mg total) by mouth daily as needed for mild constipation (use daily until BM become regular). 10 capsule 0   DULoxetine (CYMBALTA) 60 MG capsule Take 1 capsule (60 mg total) by mouth 2 (two) times daily. 180 capsule 3   Fluticasone-Umeclidin-Vilant (TRELEGY ELLIPTA) 100-62.5-25 MCG/ACT AEPB Inhale 1 puff into the lungs daily. 180 each 4   furosemide (LASIX) 80 MG tablet Take 1 tablet (80 mg total) by mouth every morning. 90 tablet 3   gabapentin (NEURONTIN) 600 MG tablet Take 600 mg by mouth at bedtime.     HYDROcodone-acetaminophen (NORCO/VICODIN) 5-325  MG tablet Take 1-2 tablets by mouth as needed for pain, no more than 3 tablets per day. 90 tablet 0   insulin degludec (TRESIBA FLEXTOUCH) 100 UNIT/ML FlexTouch Pen Inject 44 Units into the skin daily. 13 mL 5   ipratropium (ATROVENT) 0.03 % nasal spray Instill 2 sprays into the nose 3 (three) times daily as directed 30 mL 2   ipratropium-albuterol (DUONEB) 0.5-2.5 (3) MG/3ML SOLN Inhale 3 mLs by nebulization every 6 (six) hours as needed (shortness of breath, wheezing). 90 mL 3   ketoconazole (NIZORAL) 2 % cream  Apply 1 application topically at bedtime as needed for irritation. 60 g 5   lidocaine-prilocaine (EMLA) cream Apply 1 Application topically as needed. 30 g 0   linaclotide (LINZESS) 72 MCG capsule Take 1 capsule (72 mcg total) by mouth daily before breakfast. 90 capsule 3   metoprolol succinate (TOPROL XL) 25 MG 24 hr tablet Take 1 tablet (25 mg total) by mouth daily. 30 tablet 11   ondansetron (ZOFRAN) 4 MG tablet Take 1 tablet (4 mg total) by mouth daily as needed for nausea or vomiting. 30 tablet 1   OXYGEN Inhale 2 L into the lungs at bedtime.     polyethylene glycol (MIRALAX / GLYCOLAX) 17 g packet Take 17 g by mouth daily as needed (use daily until BM become regular). 14 each 0   potassium chloride SA (KLOR-CON M) 20 MEQ tablet Take 1 tablet (20 mEq total) by mouth daily. 90 tablet 3   rosuvastatin (CRESTOR) 20 MG tablet Take 1 tablet (20 mg total) by mouth at bedtime. 90 tablet 3   Semaglutide, 1 MG/DOSE, 4 MG/3ML SOPN Inject 1 mg into the skin once a week. 3 mL 5   warfarin (COUMADIN) 2.5 MG tablet Take 1 tablet (2.5mg ) by mouth on Tuesday, Friday and Sunday. Take half-a-tablet (1.25mg ) on Monday, Wednesday, Thursday, Saturday. Or take as instructed     No current facility-administered medications for this visit.    REVIEW OF SYSTEMS:    10 Point review of Systems was done is negative except as noted above.  PHYSICAL EXAMINATION: ECOG PERFORMANCE STATUS: {CHL ONC ECOG GM:[id number]}  .There were no vitals filed for this visit. There were no vitals filed for this visit. .There is no height or weight on file to calculate BMI.  GENERAL:alert, in no acute distress and comfortable SKIN: no acute rashes, no significant lesions EYES: conjunctiva are pink and non-injected, sclera anicteric OROPHARYNX: MMM, no exudates, no oropharyngeal erythema or ulceration NECK: supple, no JVD LYMPH:  no palpable lymphadenopathy in the cervical, axillary or inguinal regions LUNGS: clear to  auscultation b/l with normal respiratory effort HEART: regular rate & rhythm ABDOMEN:  normoactive bowel sounds , non tender, not distended. Extremity: no pedal edema PSYCH: alert & oriented x 3 with fluent speech NEURO: no focal motor/sensory deficits  LABORATORY DATA:  I have reviewed the data as listed  .    Latest Ref Rng & Units 11/19/2022    5:24 PM 11/16/2022    3:09 PM 10/22/2022    4:34 AM  CBC  WBC 4.0 - 10.5 K/uL 6.2  4.6  7.1   Hemoglobin 12.0 - 15.0 g/dL 44.0  9.5  8.6   Hematocrit 36.0 - 46.0 % 35.5  33.4  28.9   Platelets 150 - 400 K/uL 277  216  194     .    Latest Ref Rng & Units 12/02/2022   12:06 PM 11/19/2022    5:24 PM  11/16/2022    3:09 PM  CMP  Glucose 70 - 99 mg/dL 161  096  045   BUN 8 - 27 mg/dL 13  9  15    Creatinine 0.57 - 1.00 mg/dL 4.09  8.11  9.14   Sodium 134 - 144 mmol/L 140  139  136   Potassium 3.5 - 5.2 mmol/L 4.0  3.4  3.9   Chloride 96 - 106 mmol/L 98  105  95   CO2 20 - 29 mmol/L 25  23  28    Calcium 8.7 - 10.3 mg/dL 9.8  9.4  9.2   Total Protein 6.5 - 8.1 g/dL  5.9  6.4   Total Bilirubin 0.3 - 1.2 mg/dL  0.9  0.9   Alkaline Phos 38 - 126 U/L  86  91   AST 15 - 41 U/L  13  15   ALT 0 - 44 U/L  11  13      RADIOGRAPHIC STUDIES: I have personally reviewed the radiological images as listed and agreed with the findings in the report. CT Head Wo Contrast  Result Date: 11/19/2022 CLINICAL DATA:  Altered mental status EXAM: CT HEAD WITHOUT CONTRAST TECHNIQUE: Contiguous axial images were obtained from the base of the skull through the vertex without intravenous contrast. RADIATION DOSE REDUCTION: This exam was performed according to the departmental dose-optimization program which includes automated exposure control, adjustment of the mA and/or kV according to patient size and/or use of iterative reconstruction technique. COMPARISON:  CT head 11/16/2022 FINDINGS: Brain: No intracranial hemorrhage, mass effect, or evidence of acute infarct.  No hydrocephalus. No extra-axial fluid collection. Generalized cerebral atrophy and chronic small vessel ischemic disease. Chronic bilateral basal ganglia infarcts. Vascular: No hyperdense vessel. Intracranial arterial calcification. Skull: No fracture or focal lesion. Sinuses/Orbits: No acute finding. Other: None. IMPRESSION: 1. No acute intracranial abnormality. 2. Age-commensurate cerebral atrophy and chronic small vessel ischemic disease. Chronic bilateral basal ganglia infarcts. Electronically Signed   By: Minerva Fester M.D.   On: 11/19/2022 20:06   DG CHEST PORT 1 VIEW  Result Date: 11/19/2022 CLINICAL DATA:  Altered mental status. Recently discharged from from skilled nursing facility. EXAM: PORTABLE CHEST 1 VIEW COMPARISON:  Radiograph 11/16/2022 FINDINGS: Stable cardiomediastinal contours post median sternotomy. Aortic atherosclerosis. Chronic bronchial thickening without acute airspace disease. Left costophrenic angle is not entirely included in the field of view. No pneumothorax or large pleural effusion. Multiple remote right rib fractures. Right clavicle fixation. IMPRESSION: No acute findings.  Chronic bronchial thickening. Electronically Signed   By: Narda Rutherford M.D.   On: 11/19/2022 19:37    ASSESSMENT & PLAN:  72 y.o. female with:  Chronic iron deficiency anemia  PLAN:  -Discussed lab results from 11/19/2022 in detail with patient. CBC showed WBC of 6.2K, hemoglobin of 10.2, and platelets of 277K. -mild anemia is chronic -discussed that mild anemia may be from a combination of some amount of slow blood loss causing iron deficiency and some element of CKD. Her anemia is likely also from blood loss from hip surgery. -her hgb was lower at 8.6 one month ago -discussed that smoking is an active risk factor for cancer among other factors -educated patient that individuals who smoke would typically need higher than normal hgb levels.  -discussed that Prilosec would add to iron  deficiency and affect the absorption of certain B vitamins -discussed that especially with her history of COPD, narcotics can potentially cause CO2 accumulatoin leading to breathing issues -discussed option of assisted  living for additional support -Discussed that with CKD, the ferritin goal would be 200-250.  -If her iron levels are low based on labs, discussed options of oral iron or IV iron with a different preparation than what she previously reacted to -will order blood tests, including to evaluate iron levels and blood counts -will set up non-contrast CT scan of abdomen due to kidney issues -discussed that there may be a role for MRI if needed if there are any concerns or treatment considerations -there may be a role for growth factor shot if needed, such as if her Erythropoietin is low or she is anemic dispite iron optimization -will plan for a phone visit in 2 weeks -continue to follow with pulmonologist to manage CPAP, oxygen, and lung medications.   FOLLOW-UP: Labs today CT abd wo contrast in 1 weeks Phone visit with Dr Candise Che in about 2 weeks  The total time spent in the appointment was *** minutes* .  All of the patient's questions were answered with apparent satisfaction. The patient knows to call the clinic with any problems, questions or concerns.   Wyvonnia Lora MD MS AAHIVMS [facility] Hematology/Oncology Physician [facility]  .*Total Encounter Time as defined by the Centers for Medicare and Medicaid Services includes, in addition to the face-to-face time of a patient visit (documented in the note above) non-face-to-face time: obtaining and reviewing outside history, ordering and reviewing medications, tests or procedures, care coordination (communications with other health care professionals or caregivers) and documentation in the medical record.    I,Mitra Faeizi,acting as a Neurosurgeon for Wyvonnia Lora, MD.,have documented all relevant documentation on the behalf of  Wyvonnia Lora, MD,as directed by  Wyvonnia Lora, MD while in the presence of Wyvonnia Lora, MD.  ***

## 2022-12-19 ENCOUNTER — Ambulatory Visit: Payer: Self-pay | Admitting: *Deleted

## 2022-12-19 NOTE — Patient Outreach (Addendum)
  Care Coordination   Follow Up Visit Note   12/19/2022 Name: Mariadel Rhinehart MRN: [id number] DOB: Dec 24, 1950  Phill Myron Mieles is a 72 y.o. year old female who sees [name], [name], MD for primary care. I spoke with  [name] by phone today.  What matters to the patients health and wellness today?  Staying alone now- HHPT continues.     Goals Addressed             This Visit's Progress    Provide support, resources and mental health support       Activities and task to complete in order to accomplish goals.    Call your pharmacy about your meter supply Continue with [facility] for PT, OT  Wear your watch for fall detection Call [facility] for appointment Consider Life Alert devices for home use/safety (info mailed to review)  Continue working with the [facility] team on plans to move forward with home repairs/adaptations to be made- pay first payment to them asap to begin work Continue with family support-  Seek in-home caregiver support to aide in care needs at home Try listening to music/tv, coloring or other activities to help distract you from feelings of worry, sadness, etc. Call insurance Concierge and inquire about copay for counseling- I am happy to help connect you if agreeable- Consider follow up with [facility] [facility] program for in-home help        SDOH assessments and interventions completed:  Yes     Care Coordination Interventions:  Yes, provided  Interventions Today    Flowsheet Row Most Recent Value  Chronic Disease   Chronic disease during today's visit Other, Diabetes  [fall with fracture]  General Interventions   General Interventions Discussed/Reviewed General Interventions Discussed, Durable Medical Equipment (DME)  [Pt reports they have not called O2 supplier yet for home eval of equipment- encouraged to do so asap]  Doctor Visits Discussed/Reviewed Doctor Visits Discussed  [Plans to  see Ortho and PCP next week]  Exercise Interventions   Exercise Discussed/Reviewed Physical Activity  Physical Activity Discussed/Reviewed Physical Activity Discussed  [Pt continues to have HHPT- she reports slow progress, intermittent pain]  Education Interventions   Provided Verbal Education On [employer]  [Pt has resumed her Meals on Wheels and is getting daily meals]  Safety Interventions   Safety Discussed/Reviewed Safety Discussed  [Family has provided pt with a watch which detects falls.]       Follow up plan: Follow up call scheduled for 12/25/22    Encounter Outcome:  Patient Visit Completed

## 2022-12-19 NOTE — Patient Instructions (Signed)
Visit Information  Thank you for taking time to visit with me today. Please don't hesitate to contact me if I can be of assistance to you.   Following are the goals we discussed today:   Goals Addressed             This Visit's Progress    Provide support, resources and mental health support       Activities and task to complete in order to accomplish goals.    Call your pharmacy about your meter supply Continue with [facility] for PT, OT  Wear your watch for fall detection Call [facility] for appointment Consider Life Alert devices for home use/safety (info mailed to review)  Continue working with the [facility] team on plans to move forward with home repairs/adaptations to be made- pay first payment to them asap to begin work Continue with family support-  Seek in-home caregiver support to aide in care needs at home Try listening to music/tv, coloring or other activities to help distract you from feelings of worry, sadness, etc. Call insurance Concierge and inquire about copay for counseling- I am happy to help connect you if agreeable- Consider follow up with [employer] program for in-home help        Our next appointment is by telephone on 12/25/22    Please call the care guide team at [phone] if you need to cancel or reschedule your appointment.   If you are experiencing a Mental Health or Behavioral Health Crisis or need someone to talk to, please call the Suicide and Crisis Lifeline: 988 call 911   The patient verbalized understanding of instructions, educational materials, and care plan provided today and DECLINED offer to receive copy of patient instructions, educational materials, and care plan.   Telephone follow up appointment with care management team member scheduled for: 12/25/22 Reece Levy, MSW, LCSW [facility], [facility] Licensed Clinical Social Worker Care Coordinator   [phone]

## 2022-12-20 LAB — ERYTHROPOIETIN: Erythropoietin: 223.8 m[IU]/mL — ABNORMAL HIGH (ref 2.6–18.5)

## 2022-12-22 ENCOUNTER — Other Ambulatory Visit: Payer: Self-pay | Admitting: Internal Medicine

## 2022-12-22 ENCOUNTER — Other Ambulatory Visit: Payer: Self-pay

## 2022-12-22 ENCOUNTER — Other Ambulatory Visit (HOSPITAL_COMMUNITY): Payer: Self-pay

## 2022-12-22 DIAGNOSIS — E118 Type 2 diabetes mellitus with unspecified complications: Secondary | ICD-10-CM

## 2022-12-22 DIAGNOSIS — I480 Paroxysmal atrial fibrillation: Secondary | ICD-10-CM

## 2022-12-23 ENCOUNTER — Ambulatory Visit: Payer: PPO | Admitting: Pharmacist

## 2022-12-23 ENCOUNTER — Other Ambulatory Visit (HOSPITAL_COMMUNITY): Payer: Self-pay

## 2022-12-23 ENCOUNTER — Other Ambulatory Visit: Payer: Self-pay

## 2022-12-23 ENCOUNTER — Ambulatory Visit (INDEPENDENT_AMBULATORY_CARE_PROVIDER_SITE_OTHER): Payer: PPO | Admitting: Internal Medicine

## 2022-12-23 ENCOUNTER — Telehealth: Payer: Self-pay | Admitting: *Deleted

## 2022-12-23 ENCOUNTER — Telehealth (HOSPITAL_COMMUNITY): Payer: Self-pay

## 2022-12-23 ENCOUNTER — Other Ambulatory Visit: Payer: Self-pay | Admitting: Internal Medicine

## 2022-12-23 VITALS — BP 145/48 | HR 63 | Temp 97.9°F | Ht 62.0 in | Wt 145.2 lb

## 2022-12-23 DIAGNOSIS — Z794 Long term (current) use of insulin: Secondary | ICD-10-CM

## 2022-12-23 DIAGNOSIS — E7841 Elevated Lipoprotein(a): Secondary | ICD-10-CM | POA: Diagnosis not present

## 2022-12-23 DIAGNOSIS — R5381 Other malaise: Secondary | ICD-10-CM

## 2022-12-23 DIAGNOSIS — I480 Paroxysmal atrial fibrillation: Secondary | ICD-10-CM

## 2022-12-23 DIAGNOSIS — Z7901 Long term (current) use of anticoagulants: Secondary | ICD-10-CM | POA: Diagnosis not present

## 2022-12-23 DIAGNOSIS — F321 Major depressive disorder, single episode, moderate: Secondary | ICD-10-CM

## 2022-12-23 DIAGNOSIS — Z953 Presence of xenogenic heart valve: Secondary | ICD-10-CM | POA: Diagnosis not present

## 2022-12-23 DIAGNOSIS — E1140 Type 2 diabetes mellitus with diabetic neuropathy, unspecified: Secondary | ICD-10-CM

## 2022-12-23 DIAGNOSIS — E118 Type 2 diabetes mellitus with unspecified complications: Secondary | ICD-10-CM

## 2022-12-23 DIAGNOSIS — Z8679 Personal history of other diseases of the circulatory system: Secondary | ICD-10-CM

## 2022-12-23 DIAGNOSIS — I4891 Unspecified atrial fibrillation: Secondary | ICD-10-CM | POA: Insufficient documentation

## 2022-12-23 DIAGNOSIS — S52501D Unspecified fracture of the lower end of right radius, subsequent encounter for closed fracture with routine healing: Secondary | ICD-10-CM | POA: Diagnosis not present

## 2022-12-23 LAB — MULTIPLE MYELOMA PANEL, SERUM
Albumin SerPl Elph-Mcnc: 3.6 g/dL (ref 2.9–4.4)
Albumin/Glob SerPl: 1.3 (ref 0.7–1.7)
Alpha 1: 0.3 g/dL (ref 0.0–0.4)
Alpha2 Glob SerPl Elph-Mcnc: 0.8 g/dL (ref 0.4–1.0)
B-Globulin SerPl Elph-Mcnc: 1 g/dL (ref 0.7–1.3)
Gamma Glob SerPl Elph-Mcnc: 0.7 g/dL (ref 0.4–1.8)
Globulin, Total: 2.8 g/dL (ref 2.2–3.9)
IgA: 175 mg/dL (ref 64–422)
IgG (Immunoglobin G), Serum: 772 mg/dL (ref 586–1602)
IgM (Immunoglobulin M), Srm: 85 mg/dL (ref 26–217)
Total Protein ELP: 6.4 g/dL (ref 6.0–8.5)

## 2022-12-23 LAB — POCT INR: INR: 3.8 — AB (ref 2.0–3.0)

## 2022-12-23 MED ORDER — WARFARIN SODIUM 2.5 MG PO TABS
2.5000 mg | ORAL_TABLET | Freq: Every day | ORAL | 3 refills | Status: DC
Start: 2022-12-23 — End: 2023-07-01
  Filled 2022-12-23: qty 90, 90d supply, fill #0
  Filled 2023-03-19: qty 90, 90d supply, fill #1

## 2022-12-23 NOTE — Telephone Encounter (Signed)
Call from [place] with [name] requesting Verbal Order for SW.consult.  Evaluation of patient for placement in a Facility.  Patient has no one to help care for her.  Unable to cook food fr herself and to manage her medications. 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

## 2022-12-23 NOTE — Progress Notes (Signed)
Anticoagulation Management [name] is a 72 y.o. female who reports to the clinic for monitoring of warfarin treatment.    Indication: atrial fibrillation and history of porcine derived heart valve in the mitral position; long term current use of oral anticoagulation with warfarin, with target INR range 2.0 - 2.5 due to history of anemia/?blood loss; Evaluating potential of a DOAC.   Duration: indefinite Supervising physician:  Charissa Bash, MD  Anticoagulation Clinic Visit History: Patient does not report signs/symptoms of bleeding or thromboembolism  Other recent changes: No diet, medications, lifestyle changes except as noted in patient findings and visit with Dr. [name] today.  Anticoagulation Episode Summary     Current INR goal:  2.0-2.5  TTR:  --  Next INR check:  01/05/2023  INR from last check:  3.8 (12/23/2022)  Weekly max warfarin dose:  --  Target end date:  --  INR check location:  --  Preferred lab:  --  Send INR reminders to:  --   Indications   PAF (paroxysmal atrial fibrillation) (HCC) [I48.0] History of mitral valve replacement with bioprosthetic valve [Z95.3] Atrial fibrillation (HCC) [I48.91] Long term (current) use of anticoagulants [Z79.01]        Comments:  --         Allergies  Allergen Reactions   Dexilant [Dexlansoprazole] Other (See Comments)    Made acid reflux worse   Lorazepam Other (See Comments)    Patient's sister noted that ativan caused the patient to become extremely confused during hospitalization 09/2010; tolerates Xanax   Oxycontin [Oxycodone] Other (See Comments)    headache   Tramadol Hcl Swelling    Ankle swelling    Current Outpatient Medications:    albuterol (PROAIR HFA) 108 (90 Base) MCG/ACT inhaler, Inhale 2 puffs into the lungs every 6 (six) hours as needed for shortness of breath., Disp: 6.7 g, Rfl: 5   buPROPion (WELLBUTRIN XL) 150 MG 24 hr tablet, Take 1 tablet (150 mg total) by mouth every morning.,  Disp: 90 tablet, Rfl: 3   Cholecalciferol (VITAMIN D3) 1.25 MG (50000 UT) CAPS, Take 1 capsule (50,000 Units total) by mouth once a week for 6 doses., Disp: 6 capsule, Rfl: 0   Continuous Glucose Sensor (FREESTYLE LIBRE 3 SENSOR) MISC, Place 1 sensor on the skin every 14 days. Use to check glucose 6 times daily as directed, Disp: 6 each, Rfl: 3   docusate sodium (COLACE) 100 MG capsule, Take 1 capsule (100 mg total) by mouth daily as needed for mild constipation (use daily until BM become regular)., Disp: 10 capsule, Rfl: 0   DULoxetine (CYMBALTA) 60 MG capsule, Take 1 capsule (60 mg total) by mouth 2 (two) times daily., Disp: 180 capsule, Rfl: 3   Fluticasone-Umeclidin-Vilant (TRELEGY ELLIPTA) 100-62.5-25 MCG/ACT AEPB, Inhale 1 puff into the lungs daily., Disp: 180 each, Rfl: 4   furosemide (LASIX) 80 MG tablet, Take 1 tablet (80 mg total) by mouth every morning., Disp: 90 tablet, Rfl: 3   gabapentin (NEURONTIN) 600 MG tablet, Take 600 mg by mouth at bedtime., Disp: , Rfl:    HYDROcodone-acetaminophen (NORCO/VICODIN) 5-325 MG tablet, Take 1-2 tablets by mouth as needed for pain, no more than 3 tablets per day., Disp: 90 tablet, Rfl: 0   insulin degludec (TRESIBA FLEXTOUCH) 100 UNIT/ML FlexTouch Pen, Inject 44 Units into the skin daily., Disp: 13 mL, Rfl: 5   ipratropium (ATROVENT) 0.03 % nasal spray, Instill 2 sprays into the nose 3 (three) times daily as directed, Disp: 30  mL, Rfl: 2   ipratropium-albuterol (DUONEB) 0.5-2.5 (3) MG/3ML SOLN, Inhale 3 mLs by nebulization every 6 (six) hours as needed (shortness of breath, wheezing)., Disp: 90 mL, Rfl: 3   ketoconazole (NIZORAL) 2 % cream, Apply 1 application topically at bedtime as needed for irritation., Disp: 60 g, Rfl: 5   lidocaine-prilocaine (EMLA) cream, Apply 1 Application topically as needed., Disp: 30 g, Rfl: 0   linaclotide (LINZESS) 72 MCG capsule, Take 1 capsule (72 mcg total) by mouth daily before breakfast., Disp: 90 capsule, Rfl: 3    metoprolol succinate (TOPROL XL) 25 MG 24 hr tablet, Take 1 tablet (25 mg total) by mouth daily., Disp: 30 tablet, Rfl: 11   ondansetron (ZOFRAN) 4 MG tablet, Take 1 tablet (4 mg total) by mouth daily as needed for nausea or vomiting., Disp: 30 tablet, Rfl: 1   OXYGEN, Inhale 2 L into the lungs at bedtime., Disp: , Rfl:    polyethylene glycol (MIRALAX / GLYCOLAX) 17 g packet, Take 17 g by mouth daily as needed (use daily until BM become regular)., Disp: 14 each, Rfl: 0   potassium chloride SA (KLOR-CON M) 20 MEQ tablet, Take 1 tablet (20 mEq total) by mouth daily., Disp: 90 tablet, Rfl: 3   rosuvastatin (CRESTOR) 20 MG tablet, Take 1 tablet (20 mg total) by mouth at bedtime., Disp: 90 tablet, Rfl: 3   Semaglutide, 1 MG/DOSE, 4 MG/3ML SOPN, Inject 1 mg into the skin once a week., Disp: 3 mL, Rfl: 5   warfarin (COUMADIN) 2.5 MG tablet, Take 1 tablet by mouth daily unless otherwise instructed., Disp: 90 tablet, Rfl: 3   cetirizine (ZYRTEC ALLERGY) 10 MG tablet, Take 1 tablet (10 mg total) by mouth daily., Disp: 90 tablet, Rfl: 3 Past Medical History:  Diagnosis Date   Abnormality of lung on CXR 02/02/2020   Nonspecific finding on CXR ordered by pulmonologist - c/w inflammation vs infection, f/u imaging suggested, Dr. Roxy Cedar office has been in communication about recommended next steps.   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   Non-bleeding when visualized on capsule endoscopy 06/30/2015    Arthritis    "lower back; hands" (02/19/2018)   Asthma    Asymptomatic cholelithiasis 09/25/2015   Seen on CT scan 08/2015   Carotid artery stenosis; s/p R endarterectomy    s/p right endarterectomy (06/2010) Carotid US (07/2010):  Left: Moderate-to-severe (60-79%) calcific and non-calcific plaque origin and proximal ICA and ECA    Chronic congestive heart failure with left  ventricular diastolic dysfunction (HCC) 10/21/2010   Chronic constipation 02/03/2011   Chronic daily headache 01/16/2014   Chronic iron deficiency anemia    Chronic low back pain 10/06/2012   Chronic venous insufficiency 08/04/2012   Closed fracture of one rib of left side 08/23/2021   ED visit after a fall 08/16/21    COPD (chronic obstructive pulmonary disease) with emphysema (HCC)    PFTs 2018: severe obstructive disease, insignif response to bronchiodilator, mild restriction parenchymal pattern, moderately severe diffusion defect. 2014  FEV1 0.92 (40%), ratio 69, 27% increase in FEV1 with BD, TLC 91%, severe airtrapping, DLCO49% On chronic home O2. Pulmonary rehab referral 05/2012    Dyspnea    Fibromyalgia 08/29/2010   Gastroesophageal reflux disease    History of blood transfusion    "several times"  (02/19/2018)   History of clear cell renal cell carcinoma (HCC), in remission 07/21/2011  s/p cryoablation of left RCC in 09/2011 by Dr. Fredia Sorrow. Followed by Dr. Retta Diones  [facility]) .     History of fracture of left hip 10/17/2022   History of hiatal hernia    History of mitral valve replacement with bioprosthetic valve due to mitral stenosis 2012   s/p MVR with a 27-mm pericardial porcine valve (Medtronic Mosaic valve, serial #[id number] on 09/20/10, Dr. Donata Clay)    History of obstructive sleep apnea, resolved 2013   resolved per sleep study 07/2019; no apnea, but did have desaturation.  CPAP no longer necessary.  Nocturnal polysomnography (06/2009): Moderate sleep apnea/ hypopnea syndrome , AHI 17.8 per hour with nonpositional hypopneas. CPAP titration to 12 CWP, AHI 2.4 per hour. On nocturnal CPAP via a small resMed Quattro full-face mask with heated humidifier.    History of pneumonia    "once"  (02/19/2018)   History of seborrheic keratosis 09/28/2015   History of stroke without residual deficits 01/17/2022   Hyperlipidemia LDL goal < 100 11/20/2005   Internal and external  hemorrhoids without complication 08/04/2012   Lesion of left native kidney 06/01/2020   Incidental finding on recent screening chest CT for lung ca ordered by Dr. Maple Hudson pulmonology - he has ordered a f/u renal U/S.    Lichen sclerosus of female genitalia 01/12/2017   Migraine    "none in years" (02/19/2018)   Mild cognitive impairment with memory loss 12/23/2021   Moderately severe major depression (HCC) 11/19/2005   Nocturnal hypoxia per sleep study 07/2019    Osteoporosis    DEXA 2016: T -2.7; DEXA (12/09/2011): L-spine T -3.7, left hip T -1.4 DEXA (12/2004): L-spine T -2.6, left hip -0.1    Paroxysmal atrial fibrillation (HCC) 10/22/2010   s/p Left atrial maze procedure for paroxysmal atrial fibrillation on 09/20/2010 by Dr Donata Clay.  Subsequent splenic infarct, decision was made to re-anticoagulate with coumadin, likely life-long as this is the most likely cause of the splenic infarct.    Personal history of colonic polyps 05/14/2011   Colonoscopy (05/2011): 4 mm adenomatous polyp excised endoscopically Colonoscopy (02/2002): Adenomatous polyp excised endoscopically    Personal history of renal cell carcinoma 09/12/2020   Pneumonia    Pulmonary hypertension due to chronic obstructive pulmonary disease (HCC) 04/25/2016   2014 TEE w PA peak pressure 46 mmHg, s/p MV replacement    Right nephrolithiasis, asymptomatic, incidental finding 09/06/2014   5 mm non-obstructing calculus seen on CT scan 09/05/2014    Right ventricular failure (HCC) 04/25/2016   Severe obesity (BMI 35.0-39.9) with comorbidity (HCC) 10/23/2011   Sleep apnea    Stage 2 chronic kidney disease due to type 2 diabetes mellitus (HCC) 12/16/2018   CKD stage III     Tobacco abuse 07/28/2012   Type 2 diabetes mellitus with diabetic neuropathy (HCC)    Social History   Socioeconomic History   Marital status: Divorced    Spouse name: Not on file   Number of children: Not on file   Years of education: Not on file   Highest  education level: Not on file  Occupational History   Not on file  Tobacco Use   Smoking status: Every Day    Current packs/day: 1.00    Average packs/day: 1 pack/day for 60.1 years (60.1 ttl pk-yrs)    Types: Cigarettes    Start date: 11/01/1962   Smokeless tobacco: Never   Tobacco comments:    At least 1 PPD  Vaping Use   Vaping status: Never  Used  Substance and Sexual Activity   Alcohol use: Not Currently    Comment: 02/19/2018 "nothing since 1999"   Drug use: Not Currently    Types: Marijuana    Comment: 02/19/2018 "nothing since the 1990s"   Sexual activity: Not Currently    Birth control/protection: Post-menopausal  Other Topics Concern   Not on file  Social History Narrative   Lives [place] in [place] ([address])   Worked at [employer] for 18 years   No car   Social Determinants of Corporate investment banker Strain: Low Risk  (05/01/2022)   Overall Financial Resource Strain (CARDIA)    Difficulty of Paying Living Expenses: Not hard at all  Food Insecurity: Food Insecurity Present (10/17/2022)   Hunger Vital Sign    Worried About Running Out of Food in the Last Year: Never true    Ran Out of Food in the Last Year: Sometimes true  Transportation Needs: Unmet Transportation Needs (10/17/2022)   PRAPARE - Transportation    Lack of Transportation (Medical): Yes    Lack of Transportation (Non-Medical): Yes  Physical Activity: Inactive (05/01/2022)   Exercise Vital Sign    Days of Exercise per Week: 0 days    Minutes of Exercise per Session: 0 min  Stress: Stress Concern Present (05/01/2022)   [employer] of Occupational Health - Occupational Stress Questionnaire    Feeling of Stress : Rather much  Social Connections: Socially Isolated (05/01/2022)   Social Connection and Isolation Panel [NHANES]    Frequency of Communication with Friends and Family: More than three times a week    Frequency of Social Gatherings with Friends and Family: More than three  times a week    Attends Religious Services: Never    Database administrator or Organizations: No    Attends Engineer, structural: Never    Marital Status: Divorced   Family History  Problem Relation Age of Onset   Peptic Ulcer Disease Father    Heart attack Father 48       Died of MI at age 89   Heart attack Brother 48       Died of MI at age 48   Obesity Brother    Pneumonia Mother    Healthy Sister    Lupus Daughter    Obsessive Compulsive Disorder Daughter     ASSESSMENT Recent Results: The most recent result is correlated with 12.5 mg per week: Lab Results  Component Value Date   INR 3.8 (A) 12/23/2022   INR 1.5 (H) 11/19/2022   INR 2.4 (H) 11/16/2022    Anticoagulation Dosing:   INR today: Supratherapeutic  PLAN Weekly dose was decreased by 10% to 11.25 mg per week  Patient Instructions  Patient instructed to take medications as defined in the Anti-coagulation Track section of this encounter.  Patient instructed to take today's dose.  Patient instructed to take one-half (1/2) of your 2.5 mg strength, green-colored warfarin tablet on Tuesdays, Fridays, Saturdays and Sundays. On MONDAYS, WEDNESDAYS and THURSDAYS, take one (1) tablet.  Patient verbalized understanding of these instructions.  Patient advised to contact clinic or seek medical attention if signs/symptoms of bleeding or thromboembolism occur.  Patient verbalized understanding by repeating back information and was advised to contact me if further medication-related questions arise. Patient was also provided an information handout.  Follow-up Return in 13 days (on 01/05/2023) for Follow up INR.  [name], PharmD,CPP  15 minutes spent face-to-face with the patient  during the encounter. 50% of time spent on education, including signs/sx bleeding and clotting, as well as food and drug interactions with warfarin. 50% of time was spent on fingerprick POC INR sample collection,processing,  results determination, and documentation in [email or web address].

## 2022-12-23 NOTE — Patient Instructions (Signed)
[name],  Thanks for a good visit today.  I know you continue to feel awful, and I wish there was a quick fix.  Please reschedule your Vein and Vascular and your ophthalmology appointments.  I'll look into what happened with your prescriptions.  We discussed the need to move from xanax to something else for sleep. I'll prescribe lorazepam, which is similar, but has a slightly better safety profile.  It isn't ideal particularly in combination with your pain medicines.  Even though we acknowledge you've been on them for many many years, I fear that your thinking and memory and risk of falls are worsening as a consequence.  I'll see you again in a month!  Hang in there. A day at a time!  Dr. [name]

## 2022-12-23 NOTE — Patient Instructions (Signed)
Patient instructed to take medications as defined in the Anti-coagulation Track section of this encounter.  Patient instructed to take today's dose.  Patient instructed to take one-half (1/2) of your 2.5 mg strength, green-colored warfarin tablet on Tuesdays, Fridays, Saturdays and Sundays. On MONDAYS, WEDNESDAYS and THURSDAYS, take one (1) tablet.  Patient verbalized understanding of these instructions.

## 2022-12-23 NOTE — TOC Benefit Eligibility Note (Unsigned)
Pharmacy Patient Advocate Encounter  Insurance verification completed.    The patient is insured through [employer]/ [employer].     Ran test claim for Xarelto and the current 30 day co-pay is $11.20.  Ran test claim for Eliquis and the current 30 day co-pay is $11.20.  This test claim was processed through [facility]- copay amounts may vary at other pharmacies due to pharmacy/plan contracts, or as the patient moves through the different stages of their insurance plan.

## 2022-12-23 NOTE — Progress Notes (Signed)
72 y.o. [name] is here for routine follow-up of ***.  Since last visit patient has seen ***.   Needs refills on prilosec - where di it go RF on warfarn, semaglutide,  Where is pain medicine? Where is xanax    Patient Active Problem List   Diagnosis Date Noted   History of atrial fibrillation 10/18/2022   Chronic anticoagulation 10/18/2022   History of fracture of left hip 10/17/2022   OSA (obstructive sleep apnea) 10/17/2022   Generalized pain 08/01/2022   Multifactorial functional impairment 02/13/2022   History of stroke without residual deficits 01/17/2022   Mild cognitive impairment with memory loss 12/23/2021   Diabetic peripheral neuropathy associated with type 2 diabetes mellitus (HCC) 11/11/2021   Recurrent falls 11/11/2021   Gait instability 09/19/2021   Perennial non-allergic rhinitis 09/19/2021   Chronic, continuous use of opioids for chronic pain 07/10/2021   Polypharmacy 11/08/2020   Personal history of renal cell carcinoma 09/12/2020   Tinea pedis of both feet 08/07/2020   Nocturnal hypoxia per sleep study 07/2019    Carotid artery stenosis; s/p R endarterectomy    COPD mixed type (HCC)    Arthritis of both hands 07/01/2019   Pain due to onychomycosis of toenails of both feet 05/27/2019   Stage 2 chronic kidney disease due to type 2 diabetes mellitus (HCC) 12/16/2018   Lichen sclerosus of female genitalia 01/12/2017   Mixed stress and urge urinary incontinence 05/27/2016   Pulmonary hypertension due to chronic obstructive pulmonary disease (HCC) 04/25/2016   Asymptomatic cholelithiasis 09/25/2015   Chronic iron deficiency anemia 12/26/2014   Aortic atherosclerosis (HCC) 10/19/2014   Right nephrolithiasis, asymptomatic (incidental finding) 09/06/2014   Headache, chronic intermittent 01/14/2013   Chronic low back pain 10/06/2012   Long term current use of anticoagulant therapy 09/30/2012   Recurrent candidal intertrigo 07/28/2012   Tobacco  use disorder 07/28/2012   History of colonic polyps 05/14/2011   Sleep disturbance 05/14/2011   Abdominal wall hernia 05/14/2011   Constipation due to opioid therapy 02/03/2011   Health care maintenance 11/04/2010   PAF (paroxysmal atrial fibrillation) (HCC) 10/22/2010   Chronic diastolic heart failure (HCC) 10/21/2010   Fibromyalgia 08/29/2010   Gastroesophageal reflux disease    Type 2 diabetes mellitus with other specified complication (HCC)    Osteoporosis with current pathological fracture with routine healing    Peripheral arterial disease (HCC) 05/27/2010   History of mitral valve replacement with bioprosthetic valve 2012   Essential hypertension 06/19/2008   Hyperlipidemia 11/20/2005   Severe major depression (HCC) 11/19/2005    Current Outpatient Medications:    albuterol (PROAIR HFA) 108 (90 Base) MCG/ACT inhaler, Inhale 2 puffs into the lungs every 6 (six) hours as needed for shortness of breath., Disp: 6.7 g, Rfl: 5   buPROPion (WELLBUTRIN XL) 150 MG 24 hr tablet, Take 1 tablet (150 mg total) by mouth every morning., Disp: 90 tablet, Rfl: 3   cetirizine (ZYRTEC ALLERGY) 10 MG tablet, Take 1 tablet (10 mg total) by mouth daily., Disp: 90 tablet, Rfl: 3   Cholecalciferol (VITAMIN D3) 1.25 MG (50000 UT) CAPS, Take 1 capsule (50,000 Units total) by mouth once a week for 6 doses., Disp: 6 capsule, Rfl: 0   Continuous Glucose Sensor (FREESTYLE LIBRE 3 SENSOR) MISC, Place 1 sensor on the skin every 14 days. Use to check glucose 6 times daily as directed, Disp: 6 each, Rfl: 3   docusate sodium (COLACE) 100 MG capsule, Take 1 capsule (100 mg total)  by mouth daily as needed for mild constipation (use daily until BM become regular)., Disp: 10 capsule, Rfl: 0   DULoxetine (CYMBALTA) 60 MG capsule, Take 1 capsule (60 mg total) by mouth 2 (two) times daily., Disp: 180 capsule, Rfl: 3   Fluticasone-Umeclidin-Vilant (TRELEGY ELLIPTA) 100-62.5-25 MCG/ACT AEPB, Inhale 1 puff into the lungs  daily., Disp: 180 each, Rfl: 4   furosemide (LASIX) 80 MG tablet, Take 1 tablet (80 mg total) by mouth every morning., Disp: 90 tablet, Rfl: 3   gabapentin (NEURONTIN) 600 MG tablet, Take 600 mg by mouth at bedtime., Disp: , Rfl:    HYDROcodone-acetaminophen (NORCO/VICODIN) 5-325 MG tablet, Take 1-2 tablets by mouth as needed for pain, no more than 3 tablets per day., Disp: 90 tablet, Rfl: 0   insulin degludec (TRESIBA FLEXTOUCH) 100 UNIT/ML FlexTouch Pen, Inject 44 Units into the skin daily., Disp: 13 mL, Rfl: 5   ipratropium (ATROVENT) 0.03 % nasal spray, Instill 2 sprays into the nose 3 (three) times daily as directed, Disp: 30 mL, Rfl: 2   ipratropium-albuterol (DUONEB) 0.5-2.5 (3) MG/3ML SOLN, Inhale 3 mLs by nebulization every 6 (six) hours as needed (shortness of breath, wheezing)., Disp: 90 mL, Rfl: 3   ketoconazole (NIZORAL) 2 % cream, Apply 1 application topically at bedtime as needed for irritation., Disp: 60 g, Rfl: 5   lidocaine-prilocaine (EMLA) cream, Apply 1 Application topically as needed., Disp: 30 g, Rfl: 0   linaclotide (LINZESS) 72 MCG capsule, Take 1 capsule (72 mcg total) by mouth daily before breakfast., Disp: 90 capsule, Rfl: 3   metoprolol succinate (TOPROL XL) 25 MG 24 hr tablet, Take 1 tablet (25 mg total) by mouth daily., Disp: 30 tablet, Rfl: 11   ondansetron (ZOFRAN) 4 MG tablet, Take 1 tablet (4 mg total) by mouth daily as needed for nausea or vomiting., Disp: 30 tablet, Rfl: 1   OXYGEN, Inhale 2 L into the lungs at bedtime., Disp: , Rfl:    polyethylene glycol (MIRALAX / GLYCOLAX) 17 g packet, Take 17 g by mouth daily as needed (use daily until BM become regular)., Disp: 14 each, Rfl: 0   potassium chloride SA (KLOR-CON M) 20 MEQ tablet, Take 1 tablet (20 mEq total) by mouth daily., Disp: 90 tablet, Rfl: 3   rosuvastatin (CRESTOR) 20 MG tablet, Take 1 tablet (20 mg total) by mouth at bedtime., Disp: 90 tablet, Rfl: 3   Semaglutide, 1 MG/DOSE, 4 MG/3ML SOPN, Inject 1  mg into the skin once a week., Disp: 3 mL, Rfl: 5   warfarin (COUMADIN) 2.5 MG tablet, Take 1 tablet (2.5mg ) by mouth on Tuesday, Friday and Sunday. Take half-a-tablet (1.25mg ) on Monday, Wednesday, Thursday, Saturday. Or take as instructed, Disp: , Rfl:   Functional Status: ***  Objective BP (!) 145/48 (BP Location: Right Arm, Patient Position: Sitting, Cuff Size: Small)   Pulse 63   Temp 97.9 F (36.6 C) (Oral)   Ht 5\' 2"  (1.575 m)   Wt 145 lb 3.2 oz (65.9 kg)   SpO2 93%   BMI 26.56 kg/m   Exam: ***  Assessment and Plan: Type 2 diabetes mellitus with complications (HCC)  PAF (paroxysmal atrial fibrillation) (HCC)     No follow-ups on file.

## 2022-12-24 ENCOUNTER — Other Ambulatory Visit: Payer: Self-pay

## 2022-12-24 ENCOUNTER — Other Ambulatory Visit (HOSPITAL_COMMUNITY): Payer: Self-pay

## 2022-12-24 MED ORDER — BUPROPION HCL ER (XL) 150 MG PO TB24
150.0000 mg | ORAL_TABLET | ORAL | 3 refills | Status: DC
Start: 2022-12-24 — End: 2023-08-05
  Filled 2022-12-24: qty 90, 90d supply, fill #0
  Filled 2023-03-20: qty 90, 90d supply, fill #1
  Filled 2023-07-25: qty 90, 90d supply, fill #2

## 2022-12-24 NOTE — Telephone Encounter (Signed)
toc

## 2022-12-25 NOTE — Progress Notes (Signed)
72 y.o. [name] is here for routine follow-up of multiple chronic conditions and generalized functional decline.  She is recovering from a fall related to hip fracture (healed well though soft tissue pain continues, undergoing physical therapy) closely followed by a fall related right wrist fracture (cast was just removed yesterday-she is recovering function).  Her granddaughter and great granddaughter have since moved back home and she is living by herself again, just getting by.  She is miserable.  Uncomfortable with chronic pain, bored during the day, depending on Xanax to try to get to sleep at night.  Since last visit patient has seen Dr. Felipa Eth, her new pulmonary consultant.  She has also finally seen a hematology consultant for her chronic iron deficiency anemia.  Pending appointments include rescheduling her eye exam and her vein and vascular appointment.  These had been canceled due to hospitalization and rehab.  Needs refills on prilosec - where di it go RF on warfarn, semaglutide,  (Where is pain medicine? Where is xanax?)  Patient Active Problem List   Diagnosis Date Noted   History of atrial fibrillation 10/18/2022   Chronic anticoagulation 10/18/2022   History of fracture of left hip 10/17/2022   OSA (obstructive sleep apnea) 10/17/2022   Generalized pain 08/01/2022   Multifactorial functional impairment 02/13/2022   History of stroke without residual deficits 01/17/2022   Mild cognitive impairment with memory loss 12/23/2021   Diabetic peripheral neuropathy associated with type 2 diabetes mellitus (HCC) 11/11/2021   Recurrent falls 11/11/2021   Gait instability 09/19/2021   Perennial non-allergic rhinitis 09/19/2021   Chronic, continuous use of opioids for chronic pain 07/10/2021   Polypharmacy 11/08/2020   Personal history of renal cell carcinoma 09/12/2020   Tinea pedis of both feet 08/07/2020   Nocturnal hypoxia per sleep study 07/2019    Carotid artery  stenosis; s/p R endarterectomy    COPD mixed type (HCC)    Arthritis of both hands 07/01/2019   Pain due to onychomycosis of toenails of both feet 05/27/2019   Stage 2 chronic kidney disease due to type 2 diabetes mellitus (HCC) 12/16/2018   Lichen sclerosus of female genitalia 01/12/2017   Mixed stress and urge urinary incontinence 05/27/2016   Pulmonary hypertension due to chronic obstructive pulmonary disease (HCC) 04/25/2016   Asymptomatic cholelithiasis 09/25/2015   Chronic iron deficiency anemia 12/26/2014   Aortic atherosclerosis (HCC) 10/19/2014   Right nephrolithiasis, asymptomatic (incidental finding) 09/06/2014   Headache, chronic intermittent 01/14/2013   Chronic low back pain 10/06/2012   Long term current use of anticoagulant therapy 09/30/2012   Recurrent candidal intertrigo 07/28/2012   Tobacco use disorder 07/28/2012   History of colonic polyps 05/14/2011   Sleep disturbance 05/14/2011   Abdominal wall hernia 05/14/2011   Constipation due to opioid therapy 02/03/2011   Health care maintenance 11/04/2010   PAF (paroxysmal atrial fibrillation) (HCC) 10/22/2010   Chronic diastolic heart failure (HCC) 10/21/2010   Fibromyalgia 08/29/2010   Gastroesophageal reflux disease    Type 2 diabetes mellitus with other specified complication (HCC)    Osteoporosis with current pathological fracture with routine healing    Peripheral arterial disease (HCC) 05/27/2010   History of mitral valve replacement with bioprosthetic valve 2012   Essential hypertension 06/19/2008   Hyperlipidemia 11/20/2005   Severe major depression (HCC) 11/19/2005    Current Outpatient Medications:    albuterol (PROAIR HFA) 108 (90 Base) MCG/ACT inhaler, Inhale 2 puffs into the lungs every 6 (six) hours as needed for shortness  of breath., Disp: 6.7 g, Rfl: 5   buPROPion (WELLBUTRIN XL) 150 MG 24 hr tablet, Take 1 tablet (150 mg total) by mouth every morning., Disp: 90 tablet, Rfl: 3   cetirizine (ZYRTEC  ALLERGY) 10 MG tablet, Take 1 tablet (10 mg total) by mouth daily., Disp: 90 tablet, Rfl: 3   Cholecalciferol (VITAMIN D3) 1.25 MG (50000 UT) CAPS, Take 1 capsule (50,000 Units total) by mouth once a week for 6 doses., Disp: 6 capsule, Rfl: 0   Continuous Glucose Sensor (FREESTYLE LIBRE 3 SENSOR) MISC, Place 1 sensor on the skin every 14 days. Use to check glucose 6 times daily as directed, Disp: 6 each, Rfl: 3   docusate sodium (COLACE) 100 MG capsule, Take 1 capsule (100 mg total) by mouth daily as needed for mild constipation (use daily until BM become regular)., Disp: 10 capsule, Rfl: 0   DULoxetine (CYMBALTA) 60 MG capsule, Take 1 capsule (60 mg total) by mouth 2 (two) times daily., Disp: 180 capsule, Rfl: 3   Fluticasone-Umeclidin-Vilant (TRELEGY ELLIPTA) 100-62.5-25 MCG/ACT AEPB, Inhale 1 puff into the lungs daily., Disp: 180 each, Rfl: 4   furosemide (LASIX) 80 MG tablet, Take 1 tablet (80 mg total) by mouth every morning., Disp: 90 tablet, Rfl: 3   gabapentin (NEURONTIN) 600 MG tablet, Take 600 mg by mouth at bedtime., Disp: , Rfl:    HYDROcodone-acetaminophen (NORCO/VICODIN) 5-325 MG tablet, Take 1-2 tablets by mouth as needed for pain, no more than 3 tablets per day., Disp: 90 tablet, Rfl: 0   insulin degludec (TRESIBA FLEXTOUCH) 100 UNIT/ML FlexTouch Pen, Inject 44 Units into the skin daily., Disp: 13 mL, Rfl: 5   ipratropium (ATROVENT) 0.03 % nasal spray, Instill 2 sprays into the nose 3 (three) times daily as directed, Disp: 30 mL, Rfl: 2   ipratropium-albuterol (DUONEB) 0.5-2.5 (3) MG/3ML SOLN, Inhale 3 mLs by nebulization every 6 (six) hours as needed (shortness of breath, wheezing)., Disp: 90 mL, Rfl: 3   ketoconazole (NIZORAL) 2 % cream, Apply 1 application topically at bedtime as needed for irritation., Disp: 60 g, Rfl: 5   lidocaine-prilocaine (EMLA) cream, Apply 1 Application topically as needed., Disp: 30 g, Rfl: 0   linaclotide (LINZESS) 72 MCG capsule, Take 1 capsule (72 mcg  total) by mouth daily before breakfast., Disp: 90 capsule, Rfl: 3   metoprolol succinate (TOPROL XL) 25 MG 24 hr tablet, Take 1 tablet (25 mg total) by mouth daily., Disp: 30 tablet, Rfl: 11   ondansetron (ZOFRAN) 4 MG tablet, Take 1 tablet (4 mg total) by mouth daily as needed for nausea or vomiting., Disp: 30 tablet, Rfl: 1   OXYGEN, Inhale 2 L into the lungs at bedtime., Disp: , Rfl:    polyethylene glycol (MIRALAX / GLYCOLAX) 17 g packet, Take 17 g by mouth daily as needed (use daily until BM become regular)., Disp: 14 each, Rfl: 0   potassium chloride SA (KLOR-CON M) 20 MEQ tablet, Take 1 tablet (20 mEq total) by mouth daily., Disp: 90 tablet, Rfl: 3   rosuvastatin (CRESTOR) 20 MG tablet, Take 1 tablet (20 mg total) by mouth at bedtime., Disp: 90 tablet, Rfl: 3   Semaglutide, 1 MG/DOSE, 4 MG/3ML SOPN, Inject 1 mg into the skin once a week., Disp: 3 mL, Rfl: 5   warfarin (COUMADIN) 2.5 MG tablet, Take 1 tablet (2.5mg ) by mouth on Tuesday, Friday and Sunday. Take half-a-tablet (1.25mg ) on Monday, Wednesday, Thursday, Saturday. Or take as instructed, Disp: , Rfl:   Functional Status:  Living alone, though struggling.  Sister provides transportation.  Renisha walks with a rollator though is challenged by inability to use both hands to carry an item, as when in the kitchen.  She has primarily been wearing night clothes all day as it is difficult to dress and undress.  Gets to the bathroom independently using rollator.  Primarily sleeps in the recliner.  Becoming confused more frequently.  Objective BP (!) 145/48 (BP Location: Right Arm, Patient Position: Sitting, Cuff Size: Small)   Pulse 63   Temp 97.9 F (36.6 C) (Oral)   Ht 5\' 2"  (1.575 m)   Wt 145 lb 3.2 oz (65.9 kg)   SpO2 93%   BMI 26.56 kg/m   Exam: Ms. Sise appears unwell and tired.  There is some eyelid asymmetry which is chronic.  Involuntary head tremor present.  No cough or inspiratory effort today.  Lower extremities without  edema.  Her feet are warm and chronically dusky, pulseless.  Skin is dry and scaly consistent with tenia pedis.  No wounds.  Right wrist able to flex and extend.  She has a 1 cm ulcerated area on dorsal aspect of her second PCP.  Wound bed is dry with surrounding maceration changes (this was a result of her cast rubbing and moisture trapping).  Assessment and Plan: Type 2 diabetes mellitus with complications (HCC)  PAF (paroxysmal atrial fibrillation) (HCC)     No follow-ups on file.

## 2022-12-26 ENCOUNTER — Ambulatory Visit: Payer: Self-pay | Admitting: *Deleted

## 2022-12-26 ENCOUNTER — Other Ambulatory Visit (HOSPITAL_COMMUNITY): Payer: Self-pay

## 2022-12-26 ENCOUNTER — Other Ambulatory Visit: Payer: Self-pay | Admitting: Internal Medicine

## 2022-12-26 DIAGNOSIS — E118 Type 2 diabetes mellitus with unspecified complications: Secondary | ICD-10-CM

## 2022-12-26 MED ORDER — OZEMPIC (1 MG/DOSE) 4 MG/3ML ~~LOC~~ SOPN
1.0000 mg | PEN_INJECTOR | SUBCUTANEOUS | 5 refills | Status: DC
  Filled 2022-12-26: qty 3, 28d supply, fill #0
  Filled 2023-01-19: qty 3, 28d supply, fill #1
  Filled 2023-02-09: qty 3, 28d supply, fill #2

## 2022-12-26 NOTE — Telephone Encounter (Signed)
Pt seen on 12/23/2022 and states she is now out of the following medication  Semaglutide, 1 MG/DOSE, 4 MG/3ML SOPN  [facility]

## 2022-12-26 NOTE — Patient Outreach (Signed)
  Care Coordination   Follow Up Visit Note   12/26/2022 Name: [name] MRN: [id number] DOB: [birth year]  [name] is a 72 y.o. year old female who sees [name], Kathryn Ar, MD for primary care. I spoke with  [name] by phone today.  What matters to the patients health and wellness today?  Physical Therapy coming today.    Goals Addressed             This Visit's Progress    Provide support, resources and mental health support       Activities and task to complete in order to accomplish goals.    Continue with [facility] for PT, OT  Wear your watch for fall detection Call [facility] for appointment Consider Life Alert devices for home use/safety (info mailed to review)  Continue working with the [facility] team on plans to move forward with home repairs/adaptations to be made- pay first payment to them asap to begin work Continue with family support-  Seek in-home caregiver support to aide in care needs at home Try listening to music/tv, coloring or other activities to help distract you from feelings of worry, sadness, etc. Call insurance Concierge and inquire about copay for counseling- I am happy to help connect you if agreeable- Consider follow up with [employer] [facility] program for in-home help        SDOH assessments and interventions completed:  Yes     Care Coordination Interventions:  Yes, provided  Interventions Today    Flowsheet Row Most Recent Value  Chronic Disease   Chronic disease during today's visit Other  [recent falls with fractures]  General Interventions   General Interventions Discussed/Reviewed [facility]  Doctor Visits Discussed/Reviewed Specialist  [Pt had arm cast removed and now in brace.]  Exercise Interventions   Exercise Discussed/Reviewed Exercise Discussed  [Pt having some continued pain in leg-]  Physical Activity Discussed/Reviewed Physical Activity  Discussed  Education Interventions   Provided Verbal Education On Nutrition, Mental Health/Coping with Illness  [Continues with meals on wheels]  Mental Health Interventions   Mental Health Discussed/Reviewed Mental Health Discussed, Coping Strategies       Follow up plan: Follow up call scheduled for 12/31/22    Encounter Outcome:  Patient Visit Completed

## 2022-12-29 ENCOUNTER — Other Ambulatory Visit: Payer: Self-pay | Admitting: Internal Medicine

## 2022-12-29 ENCOUNTER — Other Ambulatory Visit: Payer: Self-pay

## 2022-12-29 ENCOUNTER — Encounter (HOSPITAL_COMMUNITY): Payer: Self-pay

## 2022-12-29 ENCOUNTER — Other Ambulatory Visit (HOSPITAL_COMMUNITY): Payer: Self-pay

## 2022-12-29 ENCOUNTER — Telehealth: Payer: Self-pay | Admitting: *Deleted

## 2022-12-29 ENCOUNTER — Telehealth (HOSPITAL_BASED_OUTPATIENT_CLINIC_OR_DEPARTMENT_OTHER): Payer: Self-pay | Admitting: Pulmonary Disease

## 2022-12-29 ENCOUNTER — Other Ambulatory Visit (HOSPITAL_BASED_OUTPATIENT_CLINIC_OR_DEPARTMENT_OTHER): Payer: Self-pay

## 2022-12-29 DIAGNOSIS — J449 Chronic obstructive pulmonary disease, unspecified: Secondary | ICD-10-CM

## 2022-12-29 DIAGNOSIS — F5104 Psychophysiologic insomnia: Secondary | ICD-10-CM

## 2022-12-29 MED ORDER — ALPRAZOLAM 0.5 MG PO TABS
0.5000 mg | ORAL_TABLET | Freq: Every day | ORAL | 0 refills | Status: DC
  Filled 2022-12-29: qty 30, 30d supply, fill #0

## 2022-12-29 MED ORDER — SPACER/AERO-HOLDING CHAMBERS DEVI
0 refills | Status: DC
  Filled 2022-12-29: qty 1, 30d supply, fill #0

## 2022-12-29 NOTE — Telephone Encounter (Signed)
Patient provided results of PST FS POC INR = 1.4 on 11.25 mg warfarin per week. Increased to 12.5 mg warfarin/wk. Repeat INR Monday, [id number]. Approx 10% increase, as per protocol. No bleeding, no missed doses, no new meds she states. Adequate supply on hand.

## 2022-12-29 NOTE — Telephone Encounter (Signed)
Received a call from pt who stated her doctor is going to start her on Lorazepam (Not on current med list); but she's allergic to this med. Also pt stated she needs a refill on Xanax until her next appt with her doctor.

## 2022-12-29 NOTE — Telephone Encounter (Signed)
Placed order for spacer  sent to [facility] pharmacy

## 2022-12-30 ENCOUNTER — Telehealth: Payer: Self-pay | Admitting: *Deleted

## 2022-12-30 ENCOUNTER — Other Ambulatory Visit (HOSPITAL_COMMUNITY): Payer: Self-pay

## 2022-12-30 NOTE — Telephone Encounter (Signed)
Call from patient stating that she is alletgic to Lorazepam, which is on her med lIst as patient stated. Stated  was supposed to have gotten Xanex 1 mg and not the 0.5 mg.  Stated she and Dr. [name] discussed it at there last visit.  During the call was disconnected x 3 and patient  called back x 2.  At last call stated she was going to take a nap and we could return her call and leave a message or call later in the evening. Call to [facility] 0.5 mg was delivered to patient n today.

## 2022-12-31 ENCOUNTER — Ambulatory Visit: Payer: Self-pay | Admitting: *Deleted

## 2022-12-31 NOTE — Patient Outreach (Signed)
  Care Coordination   Follow Up Visit Note   12/31/2022 Name: [name] MRN: [id number] DOB: 06/12/1950  [name] is a 72 y.o. year old female who sees [name], [name], MD for primary care. I spoke with  [name] by phone today.  What matters to the patients health and wellness today?  Roof is leaking.    Goals Addressed             This Visit's Progress    Provide support, resources and mental health support       Activities and task to complete in order to accomplish goals.    Continue to work with [facility] on roof, etc Continue with [facility] for PT, OT  Wear your watch for fall detection Call [facility] for appointment Consider Life Alert devices for home use/safety (info mailed to review)  Continue working with the [facility] team on plans to move forward with home repairs/adaptations to be made- pay first payment to them asap to begin work Continue with family support-  Seek in-home caregiver support to aide in care needs at home Try listening to music/tv, coloring or other activities to help distract you from feelings of worry, sadness, etc. Call insurance Concierge and inquire about copay for counseling- I am happy to help connect you if agreeable- Consider follow up with [employer] program for in-home help        SDOH assessments and interventions completed:  Yes     Care Coordination Interventions:  Yes, provided  Interventions Today    Flowsheet Row Most Recent Value  General Interventions   General Interventions Discussed/Reviewed [facility]  [Pt reports her roof leaked last week- has not  heard back from [facility]- CSW offered to call and inquire- [facility] sent someone today to assess and awaiting further plans]  Exercise Interventions   Exercise Discussed/Reviewed Exercise Discussed, Physical Activity  [Pt continues to work with HHPT and was told today she was improving and  doing much better]  Physical Activity Discussed/Reviewed Physical Activity Discussed, Physical Activity Reviewed  [HHPT]  Mental Health Interventions   Mental Health Discussed/Reviewed Mental Health Discussed       Follow up plan: Follow up call scheduled for 01/07/23    Encounter Outcome:  Patient Visit Completed

## 2023-01-02 ENCOUNTER — Other Ambulatory Visit: Payer: Self-pay

## 2023-01-02 DIAGNOSIS — E1140 Type 2 diabetes mellitus with diabetic neuropathy, unspecified: Secondary | ICD-10-CM

## 2023-01-02 MED ORDER — FREESTYLE LIBRE 3 SENSOR MISC
1.0000 | Freq: Every day | 3 refills | Status: DC
  Filled 2023-01-02: qty 6, 84d supply, fill #0
  Filled 2023-01-03 – 2023-03-19 (×2): qty 6, 84d supply, fill #1

## 2023-01-03 ENCOUNTER — Other Ambulatory Visit: Payer: Self-pay

## 2023-01-03 ENCOUNTER — Other Ambulatory Visit: Payer: Self-pay | Admitting: Internal Medicine

## 2023-01-05 ENCOUNTER — Other Ambulatory Visit: Payer: Self-pay

## 2023-01-05 ENCOUNTER — Telehealth: Payer: Self-pay | Admitting: *Deleted

## 2023-01-05 ENCOUNTER — Telehealth: Payer: Self-pay

## 2023-01-05 DIAGNOSIS — G8929 Other chronic pain: Secondary | ICD-10-CM

## 2023-01-05 NOTE — Telephone Encounter (Signed)
Fax from [facility] - today's INR 2.2 collected @ 1131 AM. Response from Dr Alexandria Lodge "She had already texted me her results and I entered a phone note in acknowledging the value, provided dosing instruction and a repeat patient self-testing, finger-stick, point of care INR to be repeated next Monday, January 12, 2023."

## 2023-01-05 NOTE — Telephone Encounter (Addendum)
Return call to [place] SW with [facility] - requesting a verbal order for "Discharge Medical Social Worker Visit" to review w/pt the resources available to her. VO given - sending to PCP for approval or denial.

## 2023-01-05 NOTE — Telephone Encounter (Signed)
[name] with [name] requesting VO for medical social worker visit. Please call her back @ [phone].

## 2023-01-05 NOTE — Telephone Encounter (Signed)
Next appt scheduled 12/19 with PCP.

## 2023-01-06 ENCOUNTER — Other Ambulatory Visit (HOSPITAL_COMMUNITY): Payer: Self-pay

## 2023-01-06 MED ORDER — GABAPENTIN 600 MG PO TABS
600.0000 mg | ORAL_TABLET | Freq: Every day | ORAL | 3 refills | Status: DC
  Filled 2023-01-06: qty 90, 90d supply, fill #0
  Filled 2023-02-17 – 2023-04-02 (×2): qty 90, 90d supply, fill #1
  Filled 2023-07-18: qty 90, 90d supply, fill #2

## 2023-01-06 MED ORDER — HYDROCODONE-ACETAMINOPHEN 5-325 MG PO TABS
1.0000 | ORAL_TABLET | Freq: Every day | ORAL | 0 refills | Status: DC | PRN
Start: 2023-01-06 — End: 2023-01-29
  Filled 2023-01-06: qty 90, 30d supply, fill #0

## 2023-01-07 ENCOUNTER — Ambulatory Visit: Payer: Self-pay | Admitting: *Deleted

## 2023-01-07 NOTE — Patient Outreach (Signed)
  Care Coordination   Follow Up Visit Note   01/07/2023 Name: [name] MRN: [id number] DOB: May 19, 1950  [name] is a 72 y.o. year old female who sees [name], [name], MD for primary care. I spoke with  [name] by phone today.  What matters to the patients health and wellness today?  Managing pain and home repairs.    Goals Addressed             This Visit's Progress    Provide support, resources and mental health support       Activities and task to complete in order to accomplish goals.    Continue to work with [facility] on roof, etc Continue with [facility] for PT, OT  Wear your watch for fall detection Consider Life Alert devices for home use/safety (info mailed to review)  Continue working with the [facility] team on plans to move forward with home repairs/adaptations to be made-   Continue with family support-  Seek in-home caregiver support to aide in care needs at home Try listening to music/tv, coloring or other activities to help distract you from feelings of worry, sadness, etc. Call insurance Concierge and inquire about copay for counseling- I am happy to help connect you if agreeable- Consider follow up with [employer] program for in-home help        SDOH assessments and interventions completed:  Yes     Care Coordination Interventions:  Yes, provided  Interventions Today    Flowsheet Row Most Recent Value  Exercise Interventions   Physical Activity Discussed/Reviewed Physical Activity Discussed  [HHPT continues- awaiting extending HHPT sessions as well as therapy for her hand]  Education Interventions   Provided Verbal Education On Mental Health/Coping with Illness, Community Resources  [CSW sending message to [facility] for updates and clarification on plans for pt's roof repair]  Mental Health Interventions   Mental Health Discussed/Reviewed Mental Health Discussed  ["frustrated and tired  of hurting"]  Pharmacy Interventions   Pharmacy Dicussed/Reviewed Pharmacy Topics Discussed  [Pt reports using OTC Voltaren for pain.]       Follow up plan: Follow up call scheduled for 01/15/23    Encounter Outcome:  Patient Visit Completed

## 2023-01-07 NOTE — Patient Instructions (Signed)
Visit Information  Thank you for taking time to visit with me today. Please don't hesitate to contact me if I can be of assistance to you.   Following are the goals we discussed today:   Goals Addressed             This Visit's Progress    Provide support, resources and mental health support       Activities and task to complete in order to accomplish goals.    Continue to work with [facility] on roof, etc Continue with [facility] for PT, OT  Wear your watch for fall detection Consider Life Alert devices for home use/safety (info mailed to review)  Continue working with the [facility] team on plans to move forward with home repairs/adaptations to be made-   Continue with family support-  Seek in-home caregiver support to aide in care needs at home Try listening to music/tv, coloring or other activities to help distract you from feelings of worry, sadness, etc. Call insurance Concierge and inquire about copay for counseling- I am happy to help connect you if agreeable- Consider follow up with [facility] program for in-home help        Our next appointment is by telephone on 01/15/23  Please call the care guide team at [phone] if you need to cancel or reschedule your appointment.   If you are experiencing a Mental Health or Behavioral Health Crisis or need someone to talk to, please call the Suicide and Crisis Lifeline: 988 call 911   The patient verbalized understanding of instructions, educational materials, and care plan provided today and DECLINED offer to receive copy of patient instructions, educational materials, and care plan.   Telephone follow up appointment with care management team member scheduled for: 01/15/23  Reece Levy, MSW, LCSW [place]  [facility], [facility] Licensed Clinical Social Worker Care Coordinator  [phone]

## 2023-01-08 ENCOUNTER — Other Ambulatory Visit: Payer: Self-pay

## 2023-01-09 ENCOUNTER — Other Ambulatory Visit: Payer: Self-pay

## 2023-01-09 ENCOUNTER — Other Ambulatory Visit (HOSPITAL_COMMUNITY): Payer: Self-pay

## 2023-01-12 ENCOUNTER — Other Ambulatory Visit: Payer: Self-pay

## 2023-01-12 ENCOUNTER — Telehealth: Payer: Self-pay | Admitting: Pharmacist

## 2023-01-12 NOTE — Telephone Encounter (Signed)
Patient provides results of PST FS POC INR determination = 1.5 (target range 2.0 - 2.5) on 12.5 mg warfarin/wk. Will increase to 13.75 mg warfarin/wk as: 1x2.5 mg on M/T/W/F; 1/2 x 2.5 mg (1.25 mg dose) on Th/Sa/Su. Repeat PST [id number].

## 2023-01-13 DIAGNOSIS — H53022 Refractive amblyopia, left eye: Secondary | ICD-10-CM | POA: Diagnosis not present

## 2023-01-13 DIAGNOSIS — H04123 Dry eye syndrome of bilateral lacrimal glands: Secondary | ICD-10-CM | POA: Diagnosis not present

## 2023-01-13 DIAGNOSIS — H43813 Vitreous degeneration, bilateral: Secondary | ICD-10-CM | POA: Diagnosis not present

## 2023-01-13 DIAGNOSIS — H05243 Constant exophthalmos, bilateral: Secondary | ICD-10-CM | POA: Diagnosis not present

## 2023-01-13 DIAGNOSIS — E119 Type 2 diabetes mellitus without complications: Secondary | ICD-10-CM | POA: Diagnosis not present

## 2023-01-13 LAB — HM DIABETES EYE EXAM

## 2023-01-14 ENCOUNTER — Telehealth: Payer: Self-pay | Admitting: *Deleted

## 2023-01-14 NOTE — Telephone Encounter (Addendum)
Call from [place]. PT [facility] went to visit patient.  Patietn fell on Thanksgiving Day when she bent over to kill a bug.  Fell on  her buttocks.  No increase in pain from.  Pain is still in her back and left leg.  At a Minimal level when resting.  Pain is 6/10 when she gets up.  Limping at times from the pain.  Stes pain not from hip goes down side and then to foot.  Radiating. Nagging constant pain.  Told Cherrie she is not getting enough sleep.  Forgets to take the Xanex at times.Confusion about the Xanex and Alprazolam. Did not realize they were the same medication.  Went to eye doctor.  Has been coughing up clar mucous since.  No fever .  Only using her Oxygen when she sleeps at night.  Had a SW visit last week of which patient denied and then later remembered.  Cherrie, PT asking foe PT 1 time a week for 2 weeks.  Balance , Strengthening and Endurance.  Gave Verbal ok for PT visit 1 times  2 weeks.  PT person to come out next went for evaluation.  Will forward approved visits to Dr. [name]

## 2023-01-15 ENCOUNTER — Ambulatory Visit: Payer: Self-pay | Admitting: *Deleted

## 2023-01-15 NOTE — Patient Instructions (Signed)
Visit Information  Thank you for taking time to visit with me today. Please don't hesitate to contact me if I can be of assistance to you.   Following are the goals we discussed today:   Goals Addressed             This Visit's Progress    Patient Stated       Provide support, resources and mental health support       Activities and task to complete in order to accomplish goals.    Communicate with PCP and other Providers on medication needs-  Continue with [facility] for PT, OT  Wear your watch for fall detection Consider Life Alert devices for home use/safety (info mailed to review) Continue with family support-  Seek in-home caregiver support to aide in care needs at home Try listening to music/tv, coloring or other activities to help distract you from feelings of worry, sadness, etc. Call insurance Concierge and inquire about copay for counseling- I am happy to help connect you if agreeable- Consider follow up with [facility] program for in-home help        Our next appointment is by telephone on 01/30/23    Please call the care guide team at [phone] if you need to cancel or reschedule your appointment.   If you are experiencing a Mental Health or Behavioral Health Crisis or need someone to talk to, please call 911   The patient verbalized understanding of instructions, educational materials, and care plan provided today and DECLINED offer to receive copy of patient instructions, educational materials, and care plan.   Telephone follow up appointment with care management team member scheduled for: 01/30/23  Reece Levy, MSW, LCSW /[facility], [facility] Licensed Clinical Social Worker Care Coordinator  [phone]

## 2023-01-15 NOTE — Patient Outreach (Addendum)
  Care Coordination   Follow Up Visit Note   01/15/2023 Name: [name] MRN: [id number] DOB: 1950-12-28  [name] is a 72 y.o. year old female who sees [name], Kathryn Ar, MD for primary care. I spoke with  [name] by phone today.  What matters to the patients health and wellness today? Missing some medication refills- uncertain about RX's and other medical questions.    Goals Addressed             This Visit's Progress    Provide support, resources and mental health support       Activities and task to complete in order to accomplish goals.    Communicate with PCP and other Providers on medication needs-  Continue with [facility] for PT, OT  Wear your watch for fall detection Consider Life Alert devices for home use/safety (info mailed to review) Continue with family support-  Seek in-home caregiver support to aide in care needs at home Try listening to music/tv, coloring or other activities to help distract you from feelings of worry, sadness, etc. Call insurance Concierge and inquire about copay for counseling- I am happy to help connect you if agreeable- Consider follow up with [facility] program for in-home help        SDOH assessments and interventions completed:  Yes     Care Coordination Interventions:  Yes, provided  Interventions Today    Flowsheet Row Most Recent Value  Chronic Disease   Chronic disease during today's visit Diabetes  General Interventions   General Interventions Discussed/Reviewed Doctor Visits, Referral to Nurse  [Pt scheduled to see her PCP next week and will discuss RX. refills, etc. CSW will ask RNCM to reach out to pt for chronic disease management,e tc]  Education Interventions   Provided Verbal Education On Mental Health/Coping with Illness  [Pt continues to decline counseling support- pt encouraged to continue to consider counseling support, community engagement/activities, etc]  Mental Health  Interventions   Mental Health Discussed/Reviewed Mental Health Discussed, Coping Strategies  [Pt pleased to have a new roof and shower installed- she does voice some frustration wtih medication questions- refills, dosing, etc- encouraged pt to commnicate with the appropriate providers regarding missing refills, questions/needs, etc]  Pharmacy Interventions   Pharmacy Dicussed/Reviewed Pharmacy Topics Discussed       Follow up plan: Follow up call scheduled for 01/30/23    Encounter Outcome:  Patient Visit Completed   [name], MSW, LCSW [place]/[facility], [facility] Licensed Clinical Social Worker Care Coordinator  [phone]

## 2023-01-16 ENCOUNTER — Other Ambulatory Visit: Payer: Self-pay | Admitting: Hematology

## 2023-01-16 ENCOUNTER — Other Ambulatory Visit (HOSPITAL_COMMUNITY): Payer: Self-pay

## 2023-01-16 ENCOUNTER — Other Ambulatory Visit: Payer: Self-pay | Admitting: Internal Medicine

## 2023-01-16 DIAGNOSIS — J439 Emphysema, unspecified: Secondary | ICD-10-CM | POA: Diagnosis not present

## 2023-01-16 DIAGNOSIS — J449 Chronic obstructive pulmonary disease, unspecified: Secondary | ICD-10-CM | POA: Diagnosis not present

## 2023-01-16 DIAGNOSIS — I270 Primary pulmonary hypertension: Secondary | ICD-10-CM | POA: Diagnosis not present

## 2023-01-16 DIAGNOSIS — I509 Heart failure, unspecified: Secondary | ICD-10-CM | POA: Diagnosis not present

## 2023-01-19 ENCOUNTER — Other Ambulatory Visit: Payer: Self-pay

## 2023-01-19 DIAGNOSIS — Z7901 Long term (current) use of anticoagulants: Secondary | ICD-10-CM | POA: Diagnosis not present

## 2023-01-19 DIAGNOSIS — I480 Paroxysmal atrial fibrillation: Secondary | ICD-10-CM | POA: Diagnosis not present

## 2023-01-19 IMAGING — CT CT GUIDANCE TISSUE ABLATION
1 of 10 series · 9 of 29 positions shown, 12 images · non-contrast
Comparison: none

CLINICAL DATA: History of renal carcinoma of the left kidney and
status post cryo ablation in 0457. Enlarging upper pole lesion of
the left kidney measures approximately 14 mm and demonstrates
imaging characteristics most likely consistent with renal carcinoma.

[Series 2: i-spiral 5.0 bf37 · axial · 0.98mm/px · z∈[+1044,+1166]mm · 9 of 47 slices shown, 12 images]
[im 6/47  mediastinal]
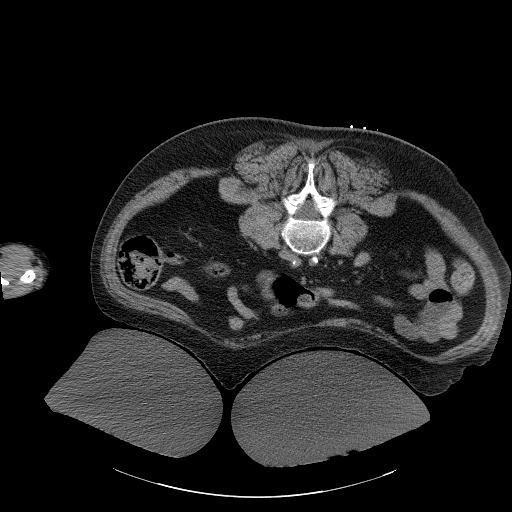
[im 6/47  lung]
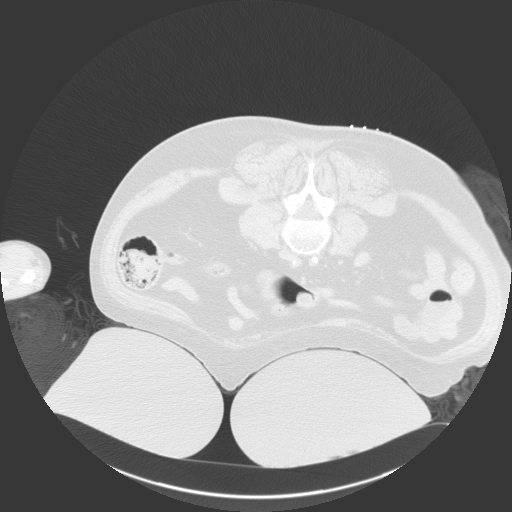
[im 11/47  lung]
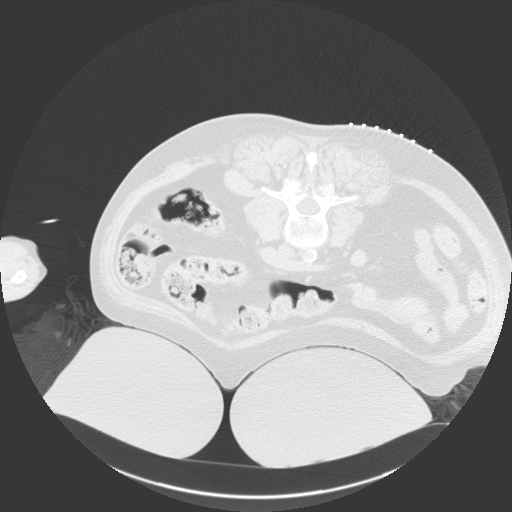
[im 16/47  lung]
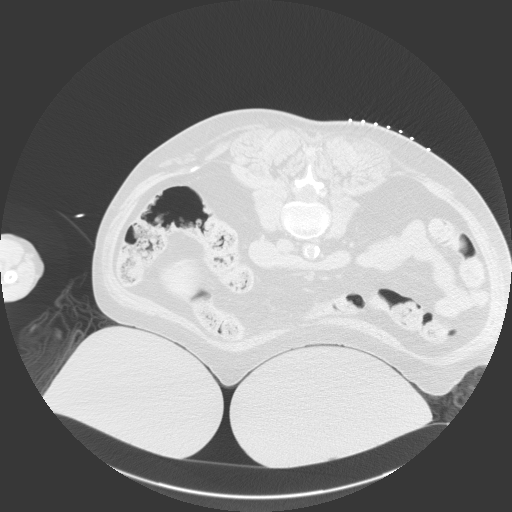
[im 21/47  lung]
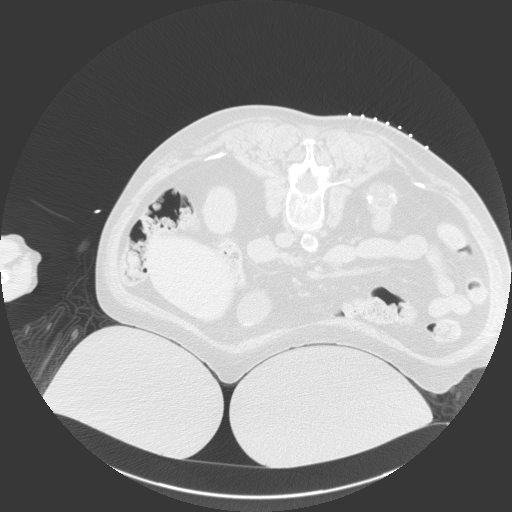
[im 22/47  mediastinal]
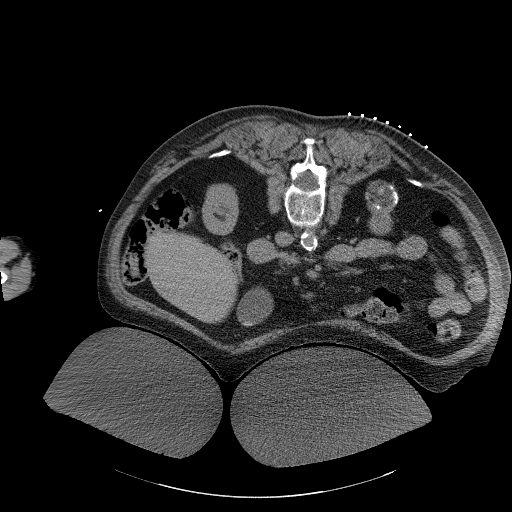
[im 22/47  lung]
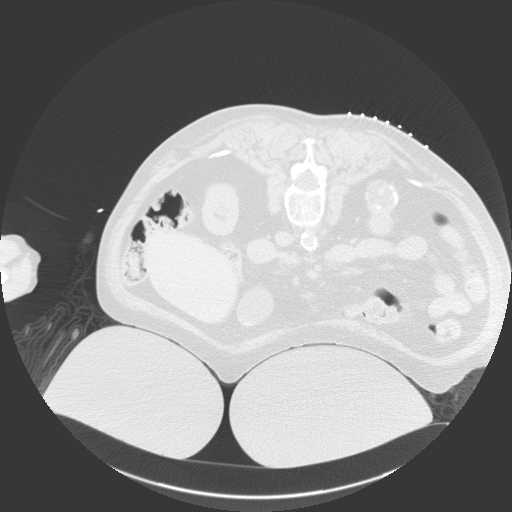
[im 26/47  lung]
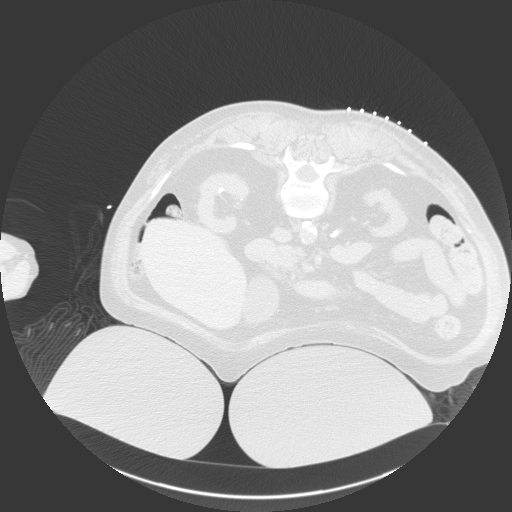
[im 31/47  lung]
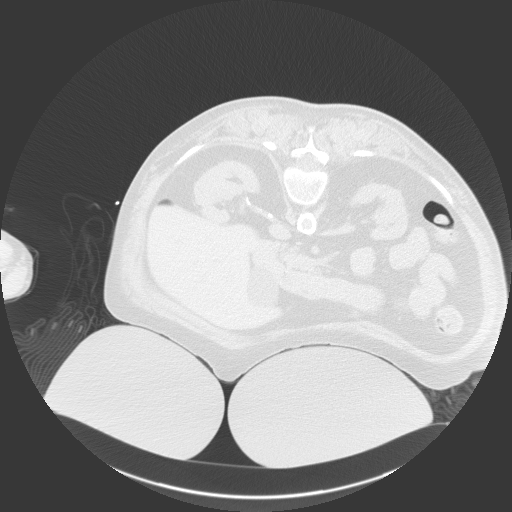
[im 36/47  lung]
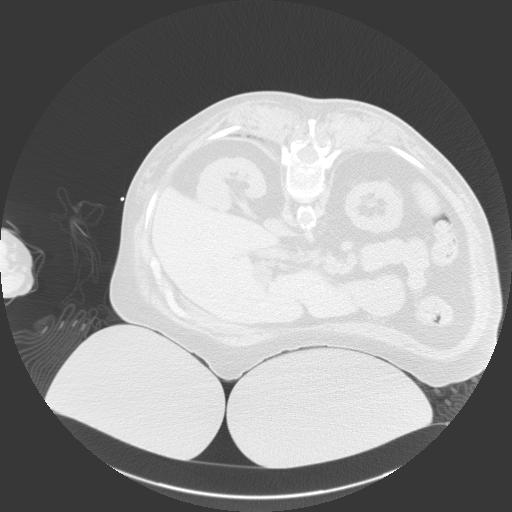
[im 41/47  mediastinal]
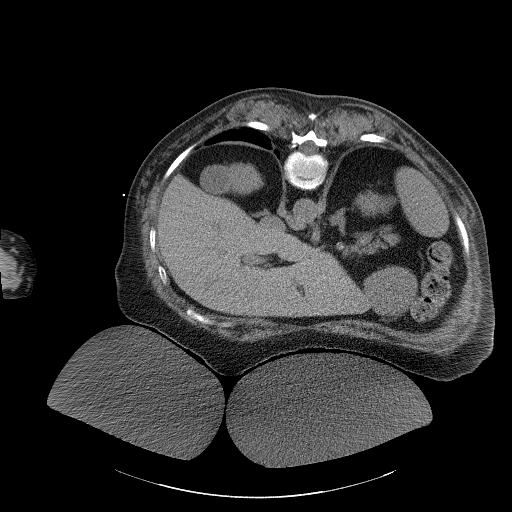
[im 41/47  lung]
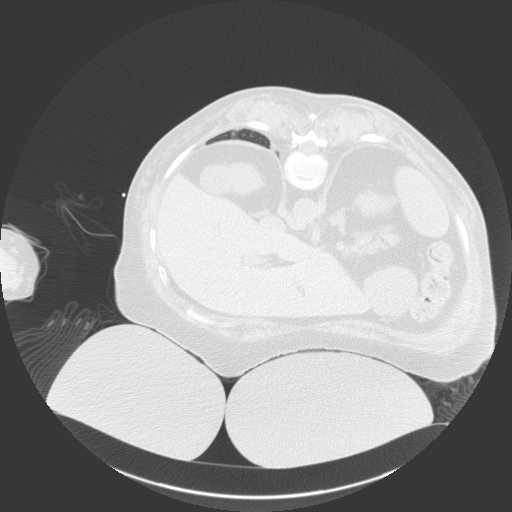

[9 of 29 positions shown; findings below may reference images not displayed]

EXAM:
CT-GUIDED PERCUTANEOUS CRYOABLATION OF LEFT RENAL MASS

ANESTHESIA/SEDATION:
General

MEDICATIONS:
2 g IV Ancef. The antibiotic was administered in an appropriate time
interval prior to needle puncture of the skin.

CONTRAST:  None

PROCEDURE:
The procedure, risks, benefits, and alternatives were explained to
the patient. Questions regarding the procedure were encouraged and
answered. The patient understands and consents to the procedure. A
time-out was performed prior to initiating the procedure.

The patient was placed under general anesthesia. Initial unenhanced
CT was performed in a prone position to localize the left kidney.
Ultrasound was also performed to localize the left kidney and upper
pole renal mass.

The left flank region was prepped with chlorhexidine in a sterile
fashion, and a sterile drape was applied covering the operative
field. A sterile gown and sterile gloves were used for the
procedure.

Under CT guidance, a Galil Ice Clementina Jumper percutaneous cryoablation
probe was advanced into the left renal mass. Probe positioning was
confirmed by CT prior to cryoablation. Hydrodissection was also
performed between the upper pole of the left kidney in the spleen
utilizing a mixture 5 mL Omnipaque 300 contrast mixed in 250 mL of
saline. Hydrodissection fluid was injected via a 22 gauge Chiba
needle after placement under ultrasound guidance. CT was performed
to monitor hydrodissection. A total volume of approximately 200 mL
of the hydrodissection fluid was utilized during the procedure.

Cryoablation was performed through the single probe. Initial 10
minute cycle of cryoablation was performed. This was followed by a 8
minute thaw cycle. A second 10 minute cycle of cryoablation was then
performed. During ablation, periodic CT imaging was performed to
monitor ice ball formation and morphology. After active thaw and 30
second cautery cycle, the cryoablation probe was removed.

COMPLICATIONS:
None
FINDINGS: Slight exophytic bulge along the lateral upper pole cortex of the
left kidney is identified from the renal mass which measures
approximately 1.4 cm in diameter. This lesion was also well
visualized by ultrasound. After placement of the cryoablation probe,
it was evident that it would be very difficult to also perform
separate biopsy of the lesion given its position adjacent to the
spleen and its relatively small size. Biopsy was therefore not
performed. Hydrodissection was successful in creating separation
between the margin of the upper pole of the kidney and the spleen.
During cryoablation, there was adequate ice ball formation
encompassing the entire mass.
IMPRESSION: CT guided percutaneous cryoablation of 1.4 cm left upper pole renal
mass. The patient will be observed overnight.

## 2023-01-19 NOTE — Assessment & Plan Note (Signed)
[name] has not recovered from her recent hospitalizations, falls with fractures, or rehab.  Her medical multicomplexity and polypharmacy are increasingly difficult to manage in an independent home setting living alone.  She needs 24/7 assistance in order to thrive.  My greatest concern is for her cognitive confusion and risk of medication errors.  An accurate assessment of her cognition would require tapering down of her centrally acting meds (opioid and benzo) or at least dosing them accurately leading up to testing.  She is dependent on alprazolam to initiate sleep.  I have suggested a transition from alprazolam to lorazepam to avoid the quick on/off effect of the alprazolam. Ideally her medicines would be carefully adjusted in a controlled setting under expert guidance, an option not currently available.  I assured her that we will not allow her to experience withdrawal, one of her greatest concerns.

## 2023-01-20 ENCOUNTER — Inpatient Hospital Stay: Payer: PPO | Attending: Hematology and Oncology | Admitting: Hematology

## 2023-01-20 ENCOUNTER — Other Ambulatory Visit (HOSPITAL_COMMUNITY): Payer: Self-pay

## 2023-01-20 ENCOUNTER — Encounter: Payer: PPO | Admitting: Internal Medicine

## 2023-01-20 ENCOUNTER — Other Ambulatory Visit: Payer: Self-pay

## 2023-01-20 DIAGNOSIS — D649 Anemia, unspecified: Secondary | ICD-10-CM | POA: Diagnosis not present

## 2023-01-20 DIAGNOSIS — D509 Iron deficiency anemia, unspecified: Secondary | ICD-10-CM

## 2023-01-20 MED ORDER — POLYSACCHARIDE IRON COMPLEX 150 MG PO CAPS
150.0000 mg | ORAL_CAPSULE | Freq: Every day | ORAL | 5 refills | Status: DC
  Filled 2023-01-20: qty 30, 30d supply, fill #0

## 2023-01-20 NOTE — Progress Notes (Signed)
HEMATOLOGY/ONCOLOGY TELE-MED VISIT NOTE  Date of Service: 01/20/2023  Patient Care Team: Miguel Aschoff, MD as PCP - General (Internal Medicine) Laurey Morale, MD as PCP - Advanced Heart Failure (Cardiology) Plyler, Cecil Cranker, RD as Dietitian (Dietician) Ernesto Rutherford, MD as Consulting Physician (Ophthalmology) Dorena Cookey, MD (Inactive) as Consulting Physician (Gastroenterology) (Rounding), Imts Leitha Bleak, MD as Attending Physician Buck Mam, LCSW as Social Worker [name], [place] as Occupational Therapist (Occupational Therapy)  CHIEF COMPLAINTS/PURPOSE OF CONSULTATION:  evaluation and management of chronic iron deficiency anemia.  HISTORY OF PRESENTING ILLNESS:   [name] is a wonderful 72 y.o. female who is here for evaluation and management of chronic iron deficiency anemia.  Today, she is accompanied by her sister and presents in a wheelchair. Patient reports that she was seen by a different hematologist previously several years ago. Patient reports having anemia all her life. She reports that her hgb does go down multiple times, requiring her to present to the hospital to receive blood and iron.  Patient denies any concerns for bleeding, such as nose bleeds, gum bleeds, black stools,or blood in the stools.   She reports having multiple falls with balance issues and reports a fractured right wrist and hip. She reports that her hip had contact with a chair and she could barely walk for some time.  She fractured her hip in September and did have hip surgery. Unfortunately, while in a nursing home for rehab, she suffered a fall and fractured her right wrist. Patient reports that she was told to see a neurologist to evaluate her balance issues/falls and she is working on scheduling an appt. Her sister reports that patient has had balance issues for a while.  She reports that she felt that she was over-medicated white being treated in the nursing  facility following her hip surgery. She describes symptoms including paleness, lethargy, and confusion.   Her sister reports that patient continues to smoke more than a pack a day.  Patient reports a history of kidney cancer diagnosed 8-10 years ago. She reports that she had her first Cryoablation at that time and her second one five years later by Dr. Fredia Sorrow. Patient reports that she required a shot every month. She does not follow a Insurance underwriter. She follows with an interventional radiologist.    Patient reports that she was previously told that she did not need to use a CPAP machine 6 months ago. She was told that she does not have sleep apnea. She generally uses at-home oxygen only when she uses her CPAP machine, which she typically uses everytime she sleeps. However, patient has issues with CPAP set up and has not used the CPAP machine recently.   She reports a hx of heart surgery. She is on coumadin chronically for afib.   Patient has required blood transfusions twice a year over the last 5-7 years. Patient has not received erythropoietin shots recently. Over the last 5-7 years, her anemia has been managed by her PCP.   She reports that she was told by hospital staff that she cannot receive iron infusions due to reactions. She had iron infusions 2-3 times in a hospital setting and not on an elective basis as outpatient. With her reaction to IV iron, her blood pressure dropped and she was given benedryl after the reaction.   Patient does take Prilosec.  She reports that she has lost 55 pounds in 2-3 years.   She denies any other changes over  the last 6 months.   She reports that she lives at home at this time.  INTERVAL HISTORY: [name] is a wonderful 72 y.o. female who is connected via phone for continued evaluation and management of chronic iron deficiency anemia. Patient was initially seen by me on 12/18/2022.   .I connected with [name] on 01/20/2023 at   1:00 PM EST by telephone visit and verified that I am speaking with the correct person using two identifiers.   Patient notes she has been doing well overall without any new or severe medical concerns since our last visit. She denies any new infection issues, fever, chills, night sweats, unexpected weight loss, back pain, chest pain, or leg swelling.   Patient notes she previously had IV Iron infusion and had mild reaction to the infusion, but she is unsure which IV Iron regimen.   Discussed lab results fro 12/18/2022 in detail with the patient.   I discussed the limitations, risks, security and privacy concerns of performing an evaluation and management service by telemedicine and the availability of in-person appointments. I also discussed with the patient that there may be a patient responsible charge related to this service. The patient expressed understanding and agreed to proceed.   Other persons participating in the visit and their role in the encounter: None   Patient's location: Home  Provider's location: [facility]   Chief Complaint: chronic iron deficiency anemia     MEDICAL HISTORY:  Past Medical History:  Diagnosis Date   Abnormality of lung on CXR 02/02/2020   Nonspecific finding on CXR ordered by pulmonologist - c/w inflammation vs infection, f/u imaging suggested, Dr. Roxy Cedar office has been in communication about recommended next steps.   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   Non-bleeding when visualized on capsule endoscopy 06/30/2015    Arthritis    "lower back; hands" (02/19/2018)   Asthma    Asymptomatic cholelithiasis 09/25/2015   Seen on CT scan 08/2015   Carotid artery stenosis; s/p R endarterectomy    s/p right endarterectomy (06/2010) Carotid US (07/2010):  Left: Moderate-to-severe (60-79%) calcific and non-calcific plaque origin and  proximal ICA and ECA    Chronic congestive heart failure with left ventricular diastolic dysfunction (HCC) 10/21/2010   Chronic constipation 02/03/2011   Chronic daily headache 01/16/2014   Chronic iron deficiency anemia    Chronic low back pain 10/06/2012   Chronic venous insufficiency 08/04/2012   Closed fracture of one rib of left side 08/23/2021   ED visit after a fall 08/16/21    COPD (chronic obstructive pulmonary disease) with emphysema (HCC)    PFTs 2018: severe obstructive disease, insignif response to bronchiodilator, mild restriction parenchymal pattern, moderately severe diffusion defect. 2014  FEV1 0.92 (40%), ratio 69, 27% increase in FEV1 with BD, TLC 91%, severe airtrapping, DLCO49% On chronic home O2. Pulmonary rehab referral 05/2012    Dyspnea    Fibromyalgia 08/29/2010   Gastroesophageal reflux disease    History of blood transfusion    "several times"  (02/19/2018)   History of clear cell renal cell carcinoma (HCC), in remission 07/21/2011   s/p cryoablation of left RCC in 09/2011 by Dr. Fredia Sorrow. Followed by Dr. Retta Diones  [facility]) .     History of fracture of left hip 10/17/2022   History of hiatal hernia    History of mitral valve replacement with bioprosthetic valve due  to mitral stenosis 2012   s/p MVR with a 27-mm pericardial porcine valve (Medtronic Mosaic valve, serial #[id number] on 09/20/10, Dr. Donata Clay)    History of obstructive sleep apnea, resolved 2013   resolved per sleep study 07/2019; no apnea, but did have desaturation.  CPAP no longer necessary.  Nocturnal polysomnography (06/2009): Moderate sleep apnea/ hypopnea syndrome , AHI 17.8 per hour with nonpositional hypopneas. CPAP titration to 12 CWP, AHI 2.4 per hour. On nocturnal CPAP via a small resMed Quattro full-face mask with heated humidifier.    History of pneumonia    "once"  (02/19/2018)   History of seborrheic keratosis 09/28/2015   History of stroke without residual deficits 01/17/2022    Hyperlipidemia LDL goal < 100 11/20/2005   Internal and external hemorrhoids without complication 08/04/2012   Lesion of left native kidney 06/01/2020   Incidental finding on recent screening chest CT for lung ca ordered by Dr. Maple Hudson pulmonology - he has ordered a f/u renal U/S.    Lichen sclerosus of female genitalia 01/12/2017   Migraine    "none in years" (02/19/2018)   Mild cognitive impairment with memory loss 12/23/2021   Moderately severe major depression (HCC) 11/19/2005   Nocturnal hypoxia per sleep study 07/2019    Osteoporosis    DEXA 2016: T -2.7; DEXA (12/09/2011): L-spine T -3.7, left hip T -1.4 DEXA (12/2004): L-spine T -2.6, left hip -0.1    Paroxysmal atrial fibrillation (HCC) 10/22/2010   s/p Left atrial maze procedure for paroxysmal atrial fibrillation on 09/20/2010 by Dr Donata Clay.  Subsequent splenic infarct, decision was made to re-anticoagulate with coumadin, likely life-long as this is the most likely cause of the splenic infarct.    Personal history of colonic polyps 05/14/2011   Colonoscopy (05/2011): 4 mm adenomatous polyp excised endoscopically Colonoscopy (02/2002): Adenomatous polyp excised endoscopically    Personal history of renal cell carcinoma 09/12/2020   Pneumonia    Pulmonary hypertension due to chronic obstructive pulmonary disease (HCC) 04/25/2016   2014 TEE w PA peak pressure 46 mmHg, s/p MV replacement    Right nephrolithiasis, asymptomatic, incidental finding 09/06/2014   5 mm non-obstructing calculus seen on CT scan 09/05/2014    Right ventricular failure (HCC) 04/25/2016   Severe obesity (BMI 35.0-39.9) with comorbidity (HCC) 10/23/2011   Sleep apnea    Stage 2 chronic kidney disease due to type 2 diabetes mellitus (HCC) 12/16/2018   CKD stage III     Tobacco abuse 07/28/2012   Type 2 diabetes mellitus with diabetic neuropathy [facility])     SURGICAL HISTORY: Past Surgical History:  Procedure Laterality Date   BIOPSY  07/19/2021   Procedure: BIOPSY;   Surgeon: Vida Rigger, MD;  Location: [facility] ENDOSCOPY;  Service: Gastroenterology;;   CARDIAC CATHETERIZATION     CARDIAC VALVE REPLACEMENT  Aug. 2012   "mitral valve"   CAROTID ENDARTERECTOMY Right 07/04/2010   by Dr. Myra Gianotti for asymptomatic right carotid artery stenosis   CATARACT EXTRACTION W/ INTRAOCULAR LENS  IMPLANT, BILATERAL Bilateral    CHEST TUBE INSERTION  09/24/2010   Dr Zenaida Niece Trigt   COLONOSCOPY  05/12/2011   performed by Dr. Bosie Clos. Showing small internal hemorrhoids, single tubular adenoma polyp   COLONOSCOPY WITH PROPOFOL N/A 05/29/2020   Procedure: COLONOSCOPY WITH PROPOFOL;  Surgeon: Vida Rigger, MD;  Location: [facility] ENDOSCOPY;  Service: Endoscopy;  Laterality: N/A;   COLONOSCOPY WITH PROPOFOL N/A 07/19/2021   Procedure: COLONOSCOPY WITH PROPOFOL;  Surgeon: Vida Rigger, MD;  Location: [facility] ENDOSCOPY;  Service:  Gastroenterology;  Laterality: N/A;   CRYOABLATION Left 09/2011   by Dr. Fredia Sorrow. Followed by Dr. Retta Diones  [facility]) .     DILATION AND CURETTAGE OF UTERUS     ESOPHAGOGASTRODUODENOSCOPY  05/12/2011   performed by Dr. Bosie Clos. Negative for ulcerations, biopsy negative for evidence of celiac sprue   ESOPHAGOGASTRODUODENOSCOPY N/A 06/29/2015   Procedure: ESOPHAGOGASTRODUODENOSCOPY (EGD);  Surgeon: Vida Rigger, MD;  Location: [facility] ENDOSCOPY;  Service: Endoscopy;  Laterality: N/A;   ESOPHAGOGASTRODUODENOSCOPY N/A 03/29/2016   Procedure: ESOPHAGOGASTRODUODENOSCOPY (EGD);  Surgeon: Vida Rigger, MD;  Location: [facility] ENDOSCOPY;  Service: Endoscopy;  Laterality: N/A;   ESOPHAGOGASTRODUODENOSCOPY (EGD) WITH PROPOFOL N/A 05/29/2020   Procedure: ESOPHAGOGASTRODUODENOSCOPY (EGD) WITH PROPOFOL;  Surgeon: Vida Rigger, MD;  Location: [facility] ENDOSCOPY;  Service: Endoscopy;  Laterality: N/A;   FRACTURE SURGERY     GIVENS CAPSULE STUDY N/A 06/30/2015   Procedure: GIVENS CAPSULE STUDY;  Surgeon: Vida Rigger, MD;  Location: [facility] ENDOSCOPY;  Service: Endoscopy;  Laterality: N/A;   GIVENS CAPSULE STUDY N/A  06/29/2015   Procedure: GIVENS CAPSULE STUDY;  Surgeon: Vida Rigger, MD;  Location: [facility] ENDOSCOPY;  Service: Endoscopy;  Laterality: N/A;   HEMORRHOID SURGERY  1970s?   "lanced"   HYSTEROSCOPY W/ ENDOMETRIAL ABLATION  06/2001   for persistent post-menopausal bleeding // by S. Kyra Manges, M.D.   INTRAMEDULLARY (IM) NAIL INTERTROCHANTERIC Left 10/19/2022   Procedure: LEFT INTRAMEDULLARY (IM) NAIL INTERTROCHANTERIC;  Surgeon: Luci Bank, MD;  Location: [facility] OR;  Service: Orthopedics;  Laterality: Left;   IR GENERIC HISTORICAL  08/23/2015   IR RADIOLOGIST EVAL & MGMT 08/23/2015 Irish Lack, MD [facility] INTERV RAD   IR GENERIC HISTORICAL  04/09/2016   IR RADIOLOGIST EVAL & MGMT 04/09/2016 Irish Lack, MD [facility] INTERV RAD   IR RADIOLOGIST EVAL & MGMT  10/07/2016   IR RADIOLOGIST EVAL & MGMT  06/25/2017   IR RADIOLOGIST EVAL & MGMT  07/31/2020   IR RADIOLOGIST EVAL & MGMT  10/02/2020   IR RADIOLOGIST EVAL & MGMT  12/20/2020   IR RADIOLOGIST EVAL & MGMT  12/25/2021   LEFT HEART CATH AND CORONARY ANGIOGRAPHY N/A 04/21/2016   Procedure: Left Heart Cath and Coronary Angiography;  Surgeon: Runell Gess, MD;  Location: [facility] INVASIVE CV LAB;  Service: Cardiovascular;  Laterality: N/A;   LIPOMA EXCISION  08/2005   occipital lipoma 1.5cm - by Dr. Maryagnes Amos   LITHOTRIPSY  ~ 2000   MAZE Left 09/20/10   for paroxysmal atrial fibrillation (Dr. Donata Clay)   MITRAL VALVE REPLACEMENT  09/20/10    with a 27-mm pericardial porcine valve (Medtronic Mosaic valve, serial #[id number]). 09/20/10, Dr Donata Clay   ORIF CLAVICLE FRACTURE Right 01/2004   by Veverly Fells. Ophelia Charter, M.D for Right clavicle nonunion.; "it's got a pin in it"   POLYPECTOMY  05/29/2020   Procedure: POLYPECTOMY;  Surgeon: Vida Rigger, MD;  Location: [facility] ENDOSCOPY;  Service: Endoscopy;;   POLYPECTOMY  07/19/2021   Procedure: POLYPECTOMY;  Surgeon: Vida Rigger, MD;  Location: [facility] ENDOSCOPY;  Service: Gastroenterology;;   RADIOLOGY WITH ANESTHESIA Left 09/12/2020    Procedure: RADIOLOGY WITH ANESTHESIA- RENAL CRYOABLATION;  Surgeon: Irish Lack, MD;  Location: [facility] ORS;  Service: Radiology;  Laterality: Left;   REFRACTIVE SURGERY Bilateral    RIGHT HEART CATH N/A 04/23/2016   Procedure: Right Heart Cath;  Surgeon: Laurey Morale, MD;  Location: [facility] INVASIVE CV LAB;  Service: Cardiovascular;  Laterality: N/A;   TONSILLECTOMY     TUBAL LIGATION      SOCIAL  HISTORY: Social History   Socioeconomic History   Marital status: Divorced    Spouse name: Not on file   Number of children: Not on file   Years of education: Not on file   Highest education level: Not on file  Occupational History   Not on file  Tobacco Use   Smoking status: Every Day    Current packs/day: 1.00    Average packs/day: 1 pack/day for 60.2 years (60.2 ttl pk-yrs)    Types: Cigarettes    Start date: 11/01/1962   Smokeless tobacco: Never   Tobacco comments:    At least 1 PPD  Vaping Use   Vaping status: Never Used  Substance and Sexual Activity   Alcohol use: Not Currently    Comment: 02/19/2018 "nothing since 1999"   Drug use: Not Currently    Types: Marijuana    Comment: 02/19/2018 "nothing since the 1990s"   Sexual activity: Not Currently    Birth control/protection: Post-menopausal  Other Topics Concern   Not on file  Social History Narrative   Lives alone in [place] ([address])   Worked at [employer] for 18 years   No car   Social Determinants of Corporate investment banker Strain: Low Risk  (05/01/2022)   Overall Financial Resource Strain (CARDIA)    Difficulty of Paying Living Expenses: Not hard at all  Food Insecurity: Food Insecurity Present (10/17/2022)   Hunger Vital Sign    Worried About Running Out of Food in the Last Year: Never true    Ran Out of Food in the Last Year: Sometimes true  Transportation Needs: Unmet Transportation Needs (10/17/2022)   PRAPARE - Transportation    Lack of Transportation (Medical): Yes    Lack of  Transportation (Non-Medical): Yes  Physical Activity: Inactive (05/01/2022)   Exercise Vital Sign    Days of Exercise per Week: 0 days    Minutes of Exercise per Session: 0 min  Stress: Stress Concern Present (05/01/2022)   [employer] of Occupational Health - Occupational Stress Questionnaire    Feeling of Stress : Rather much  Social Connections: Socially Isolated (05/01/2022)   Social Connection and Isolation Panel [NHANES]    Frequency of Communication with Friends and Family: More than three times a week    Frequency of Social Gatherings with Friends and Family: More than three times a week    Attends Religious Services: Never    Database administrator or Organizations: No    Attends Banker Meetings: Never    Marital Status: Divorced  Catering manager Violence: Not At Risk (10/17/2022)   Humiliation, Afraid, Rape, and Kick questionnaire    Fear of Current or Ex-Partner: No    Emotionally Abused: No    Physically Abused: No    Sexually Abused: No    FAMILY HISTORY: Family History  Problem Relation Age of Onset   Peptic Ulcer Disease Father    Heart attack Father 69       Died of MI at age 78   Heart attack Brother 64       Died of MI at age 20   Obesity Brother    Pneumonia Mother    Healthy Sister    Lupus Daughter    Obsessive Compulsive Disorder Daughter     ALLERGIES:  is allergic to dexilant [dexlansoprazole], lorazepam, oxycontin [oxycodone], and tramadol hcl.  MEDICATIONS:  Current Outpatient Medications  Medication Sig Dispense Refill   albuterol (PROAIR HFA) 108 (90  Base) MCG/ACT inhaler Inhale 2 puffs into the lungs every 6 (six) hours as needed for shortness of breath. 6.7 g 5   ALPRAZolam (XANAX) 0.5 MG tablet Take 1 tablet (0.5 mg total) by mouth at bedtime. To help with sleep. 30 tablet 0   buPROPion (WELLBUTRIN XL) 150 MG 24 hr tablet Take 1 tablet (150 mg total) by mouth every morning. 90 tablet 3   Continuous Glucose Sensor (FREESTYLE  LIBRE 3 SENSOR) MISC Place 1 sensor on the skin every 14 days. Use to check glucose 6 times daily as directed 6 each 3   docusate sodium (COLACE) 100 MG capsule Take 1 capsule (100 mg total) by mouth daily as needed for mild constipation (use daily until BM become regular). 10 capsule 0   DULoxetine (CYMBALTA) 60 MG capsule Take 1 capsule (60 mg total) by mouth 2 (two) times daily. 180 capsule 3   Fluticasone-Umeclidin-Vilant (TRELEGY ELLIPTA) 100-62.5-25 MCG/ACT AEPB Inhale 1 puff into the lungs daily. 180 each 4   furosemide (LASIX) 80 MG tablet Take 1 tablet (80 mg total) by mouth every morning. 90 tablet 3   gabapentin (NEURONTIN) 600 MG tablet Take 1 tablet (600 mg total) by mouth at bedtime. 90 tablet 3   HYDROcodone-acetaminophen (NORCO/VICODIN) 5-325 MG tablet Take 1-2 tablets by mouth as needed for pain, no more than 3 tablets per day. 90 tablet 0   insulin degludec (TRESIBA FLEXTOUCH) 100 UNIT/ML FlexTouch Pen Inject 44 Units into the skin daily. 13 mL 5   ipratropium (ATROVENT) 0.03 % nasal spray Instill 2 sprays into the nose 3 (three) times daily as directed 30 mL 2   ipratropium-albuterol (DUONEB) 0.5-2.5 (3) MG/3ML SOLN Inhale 3 mLs by nebulization every 6 (six) hours as needed (shortness of breath, wheezing). 90 mL 3   ketoconazole (NIZORAL) 2 % cream Apply 1 application topically at bedtime as needed for irritation. 60 g 5   lidocaine-prilocaine (EMLA) cream Apply 1 Application topically as needed. 30 g 0   linaclotide (LINZESS) 72 MCG capsule Take 1 capsule (72 mcg total) by mouth daily before breakfast. 90 capsule 3   metoprolol succinate (TOPROL XL) 25 MG 24 hr tablet Take 1 tablet (25 mg total) by mouth daily. 30 tablet 11   ondansetron (ZOFRAN) 4 MG tablet Take 1 tablet (4 mg total) by mouth daily as needed for nausea or vomiting. 30 tablet 1   OXYGEN Inhale 2 L into the lungs at bedtime.     polyethylene glycol (MIRALAX / GLYCOLAX) 17 g packet Take 17 g by mouth daily as needed  (use daily until BM become regular). 14 each 0   potassium chloride SA (KLOR-CON M) 20 MEQ tablet Take 1 tablet (20 mEq total) by mouth daily. 90 tablet 3   rosuvastatin (CRESTOR) 20 MG tablet Take 1 tablet (20 mg total) by mouth at bedtime. 90 tablet 3   Semaglutide, 1 MG/DOSE, (OZEMPIC, 1 MG/DOSE,) 4 MG/3ML SOPN Inject 1 mg into the skin once a week. 3 mL 5   Spacer/Aero-Holding [facility] Use as needed with inhaler 1 kit 0   warfarin (COUMADIN) 2.5 MG tablet Take 1 tablet (2.5 mg total) by mouth daily unless otherwise instructed 90 tablet 3   No current facility-administered medications for this visit.    REVIEW OF SYSTEMS:    10 Point review of Systems was done is negative except as noted above.  PHYSICAL EXAMINATION: TELE-MED VISIT  LABORATORY DATA:  I have reviewed the data as listed  .  Latest Ref Rng & Units 12/18/2022   11:41 AM 11/19/2022    5:24 PM 11/16/2022    3:09 PM  CBC  WBC 4.0 - 10.5 K/uL 7.9  6.2  4.6   Hemoglobin 12.0 - 15.0 g/dL 29.5  62.1  9.5   Hematocrit 36.0 - 46.0 % 36.7  35.5  33.4   Platelets 150 - 400 K/uL 246  277  216     .    Latest Ref Rng & Units 12/18/2022   11:41 AM 12/02/2022   12:06 PM 11/19/2022    5:24 PM  CMP  Glucose 70 - 99 mg/dL 87  308  657   BUN 8 - 23 mg/dL 13  13  9    Creatinine 0.44 - 1.00 mg/dL 8.46  9.62  9.52   Sodium 135 - 145 mmol/L 139  140  139   Potassium 3.5 - 5.1 mmol/L 4.6  4.0  3.4   Chloride 98 - 111 mmol/L 102  98  105   CO2 22 - 32 mmol/L 32  25  23   Calcium 8.9 - 10.3 mg/dL 84.1  9.8  9.4   Total Protein 6.5 - 8.1 g/dL 6.8   5.9   Total Bilirubin <1.2 mg/dL 0.5   0.9   Alkaline Phos 38 - 126 U/L 69   86   AST 15 - 41 U/L 13   13   ALT 0 - 44 U/L 8   11      RADIOGRAPHIC STUDIES: I have personally reviewed the radiological images as listed and agreed with the findings in the report. No results found.  ASSESSMENT & PLAN:  72 y.o. female with:  Chronic iron deficiency  anemia  PLAN:  -Discussed lab results from 12/18/2022 in detail with the patient. CBC shows patient is anemic with hemoglobin of 10.7 g/dL with low MCH of 32.4 pg, low MCHC of 29.7 g/dL, and elevated RDW of 40.1%. CMP is stable. Ferritin level of 11 ng/mL with iron saturation of 4%. PT is iron-deficient. Erythropoietin level elevated at 223.8. LDH level of 179. -Discussed multiple myeloma panel results from 12/18/2022. Did not show any abnormalities. Did not show M-Protein.  -Discussed the option of Iv Iron infusion or iron supplement. -Discussed with the patient to start Iron Polysaccharides once daily. We will hold IV Iron for now.   -discussed that smoking is an active risk factor for cancer among other factors  FOLLOW-UP: RTC with Dr Candise Che with labs in 4 months (to evaluate response to po iron)  The total time spent in the appointment was 20 minutes* .  All of the patient's questions were answered with apparent satisfaction. The patient knows to call the clinic with any problems, questions or concerns.   Wyvonnia Lora MD MS AAHIVMS [facility] Hematology/Oncology Physician [facility]  .*Total Encounter Time as defined by the Centers for Medicare and Medicaid Services includes, in addition to the face-to-face time of a patient visit (documented in the note above) non-face-to-face time: obtaining and reviewing outside history, ordering and reviewing medications, tests or procedures, care coordination (communications with other health care professionals or caregivers) and documentation in the medical record.  I,Param Shah,acting as a Neurosurgeon for Wyvonnia Lora, MD.,have documented all relevant documentation on the behalf of Wyvonnia Lora, MD,as directed by  Wyvonnia Lora, MD while in the presence of Wyvonnia Lora, MD.  .I have reviewed the above documentation for accuracy and completeness, and I agree with the above. Corene Cornea  Candise Che MD

## 2023-01-21 MED ORDER — ALBUTEROL SULFATE HFA 108 (90 BASE) MCG/ACT IN AERS
2.0000 | INHALATION_SPRAY | Freq: Four times a day (QID) | RESPIRATORY_TRACT | 11 refills | Status: DC | PRN
  Filled 2023-01-21: qty 20.1, 75d supply, fill #0
  Filled 2023-03-13: qty 36, 50d supply, fill #1

## 2023-01-22 ENCOUNTER — Telehealth: Payer: Self-pay

## 2023-01-22 ENCOUNTER — Other Ambulatory Visit: Payer: Self-pay

## 2023-01-22 ENCOUNTER — Ambulatory Visit: Payer: Self-pay

## 2023-01-22 NOTE — Patient Instructions (Signed)
Visit Information  Thank you for taking time to visit with me today. Please don't hesitate to contact me if I can be of assistance to you.   Following are the goals we discussed today:   Goals Addressed             This Visit's Progress    COMPLETED: Care Coordination Activities-no follow up required        Care Coordination Interventions: Active listening / Reflection utilized  Emotional Support Provided Problem Solving /Task Center strategies reviewed Discussed/.Educated Care Coordination Program 2.   Discussed/.Educated Annual Wellness Visit 3.   Discussed/.Educated Social Determinates of Health 4.   Please inform PCP if services needed in the future           Thank you for allowing me to share the  care coordination services that are available to you as part of your health plan and services through your primary care provider. Please reach out to your Office if the care coordination team may be of assistance to you in the future.   Juanell Fairly RN, BSN, [facility] Care Management Coordinator [facility], Population Health  Phone: [phone]

## 2023-01-22 NOTE — Telephone Encounter (Signed)
RTC to [place], PT message from Dr. [name].  Will determine parameters after patient's next visit.  Charisse will await call from the Clinics.

## 2023-01-22 NOTE — Patient Outreach (Signed)
  Care Coordination   Initial Visit Note   01/22/2023 Name: [name] MRN: [id number] DOB: 06-19-1950  [name] is a 72 y.o. year old female who sees [name], [name], MD for primary care. I spoke with  [name] by phone today.  What matters to the patients health and wellness today?  I spoke with [name] today, and she understands her health conditions and medications. She believes her Xanax dose should be increased because she uses it for sleep. Initially, she was confused about alprazolam being the generic name for Xanax, but I clarified that for her. She plans to discuss the dosage increase with her physician during her next appointment. Her last A1C for diabetes was 7.7, and she feels it is under control. However, she continues to smoke a pack of cigarettes daily and did not express an interest in quitting at this time. When I asked if she needed any help, she preferred to consult her physician and mentioned she would reach out in the future if necessary.    Goals Addressed             This Visit's Progress    COMPLETED: Care Coordination Activities-no follow up required        Care Coordination Interventions: Active listening / Reflection utilized  Emotional Support Provided Problem Solving /Task Center strategies reviewed Discussed/.Educated Care Coordination Program 2.   Discussed/.Educated Annual Wellness Visit 3.   Discussed/.Educated Social Determinates of Health 4.   Please inform PCP if services needed in the future         SDOH assessments and interventions completed:  Yes  SDOH Interventions Today    Flowsheet Row Most Recent Value  SDOH Interventions   Food Insecurity Interventions Intervention Not Indicated  Housing Interventions Intervention Not Indicated  Transportation Interventions Intervention Not Indicated        Care Coordination Interventions:  Yes, provided   Interventions Today    Flowsheet  Row Most Recent Value  Chronic Disease   Chronic disease during today's visit Diabetes, Chronic Obstructive Pulmonary Disease (COPD)  General Interventions   General Interventions Discussed/Reviewed General Interventions Discussed, General Interventions Reviewed  Pharmacy Interventions   Pharmacy Dicussed/Reviewed Pharmacy Topics Discussed  Safety Interventions   Safety Discussed/Reviewed Safety Discussed        Follow up plan: No further intervention required.   Encounter Outcome:  Patient Visit Completed   Kathryn Fairly RN, BSN, [facility] [place]  [facility], [facility]  Care Coordinator Phone: [phone]

## 2023-01-22 NOTE — Telephone Encounter (Signed)
Call from [place], PT with [facility].  Need parameters for patient's weight.  Previous was 146-156.  If not in this range a call will be made to make doctor aware of the change in weight.  Question is if these can be changed?

## 2023-01-22 NOTE — Telephone Encounter (Signed)
Charri from [facility] called requeting verbal orders please return her call @[phone].

## 2023-01-23 ENCOUNTER — Other Ambulatory Visit (HOSPITAL_COMMUNITY): Payer: Self-pay

## 2023-01-26 NOTE — Telephone Encounter (Signed)
Patient provides FS POC PST INR result of 1.8 (target goal for warfarin is 2.0 - 2.5) while on 13.5 mg warfarin/wk. Will INCREASE to 15 mg warfarin per week as: 2.5 mg Mon-Fri; 1/2 x 2.5 (1.25mg ) on Saturday & Sunday. Seeing provider in [facility] on 01/29/23.

## 2023-01-27 DIAGNOSIS — M25562 Pain in left knee: Secondary | ICD-10-CM | POA: Diagnosis not present

## 2023-01-27 DIAGNOSIS — M25552 Pain in left hip: Secondary | ICD-10-CM | POA: Diagnosis not present

## 2023-01-27 DIAGNOSIS — M25531 Pain in right wrist: Secondary | ICD-10-CM | POA: Diagnosis not present

## 2023-01-27 DIAGNOSIS — M79672 Pain in left foot: Secondary | ICD-10-CM | POA: Diagnosis not present

## 2023-01-28 ENCOUNTER — Telehealth: Payer: Self-pay | Admitting: *Deleted

## 2023-01-28 NOTE — Telephone Encounter (Signed)
Call from [place] PT with [facility]. 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 Pt's pain level is 7 out of 10. Pt's weight is 143.6 lbs. [place] stated current weight parameter to call the office is 146 - 156 lbs; requesting new parameter since pt has been stable around 143 lbs (otherwise, they have to call the office each time). Thanks

## 2023-01-29 ENCOUNTER — Other Ambulatory Visit: Payer: Self-pay

## 2023-01-29 ENCOUNTER — Other Ambulatory Visit (HOSPITAL_COMMUNITY): Payer: Self-pay

## 2023-01-29 ENCOUNTER — Ambulatory Visit: Payer: PPO | Admitting: Internal Medicine

## 2023-01-29 VITALS — BP 142/51 | HR 58 | Temp 98.1°F | Ht 62.0 in | Wt 144.5 lb

## 2023-01-29 DIAGNOSIS — F5104 Psychophysiologic insomnia: Secondary | ICD-10-CM

## 2023-01-29 DIAGNOSIS — Z79899 Other long term (current) drug therapy: Secondary | ICD-10-CM

## 2023-01-29 DIAGNOSIS — E1169 Type 2 diabetes mellitus with other specified complication: Secondary | ICD-10-CM

## 2023-01-29 DIAGNOSIS — R296 Repeated falls: Secondary | ICD-10-CM

## 2023-01-29 DIAGNOSIS — R52 Pain, unspecified: Secondary | ICD-10-CM

## 2023-01-29 DIAGNOSIS — Z794 Long term (current) use of insulin: Secondary | ICD-10-CM | POA: Diagnosis not present

## 2023-01-29 DIAGNOSIS — D509 Iron deficiency anemia, unspecified: Secondary | ICD-10-CM

## 2023-01-29 DIAGNOSIS — Z7985 Long-term (current) use of injectable non-insulin antidiabetic drugs: Secondary | ICD-10-CM | POA: Diagnosis not present

## 2023-01-29 DIAGNOSIS — E1140 Type 2 diabetes mellitus with diabetic neuropathy, unspecified: Secondary | ICD-10-CM

## 2023-01-29 DIAGNOSIS — G4700 Insomnia, unspecified: Secondary | ICD-10-CM

## 2023-01-29 DIAGNOSIS — I10 Essential (primary) hypertension: Secondary | ICD-10-CM

## 2023-01-29 DIAGNOSIS — G479 Sleep disorder, unspecified: Secondary | ICD-10-CM | POA: Diagnosis not present

## 2023-01-29 DIAGNOSIS — G8929 Other chronic pain: Secondary | ICD-10-CM

## 2023-01-29 MED ORDER — ALPRAZOLAM 0.5 MG PO TABS
0.5000 mg | ORAL_TABLET | Freq: Every day | ORAL | 0 refills | Status: DC
  Filled 2023-01-29: qty 30, 30d supply, fill #0

## 2023-01-29 MED ORDER — RAMELTEON 8 MG PO TABS
8.0000 mg | ORAL_TABLET | Freq: Every day | ORAL | 3 refills | Status: DC
  Filled 2023-01-29: qty 30, 30d supply, fill #0
  Filled 2023-02-24: qty 30, 30d supply, fill #1
  Filled 2023-03-26: qty 30, 30d supply, fill #2
  Filled 2023-05-06: qty 30, 30d supply, fill #3

## 2023-01-29 MED ORDER — HYDROCODONE-ACETAMINOPHEN 5-325 MG PO TABS
2.0000 | ORAL_TABLET | Freq: Two times a day (BID) | ORAL | 0 refills | Status: DC
  Filled 2023-02-05 (×2): qty 120, 30d supply, fill #0

## 2023-01-29 MED ORDER — OMEPRAZOLE 40 MG PO CPDR
40.0000 mg | DELAYED_RELEASE_CAPSULE | Freq: Two times a day (BID) | ORAL | 5 refills | Status: DC
  Filled 2023-01-29: qty 60, 30d supply, fill #0
  Filled 2023-02-24: qty 60, 30d supply, fill #1
  Filled 2023-03-26: qty 60, 30d supply, fill #2
  Filled 2023-05-06: qty 60, 30d supply, fill #3
  Filled 2023-06-10: qty 60, 30d supply, fill #4
  Filled 2023-07-18: qty 60, 30d supply, fill #5

## 2023-01-29 NOTE — Patient Instructions (Signed)
Ms. Homewood,  I know it's been a very rough several months.  You are making progress with recovery - even though you are not feeling well, you look much better today than at our last visit.  We discussed addition of Ramelteon for sleep.  Goal is to find an alternative to the alprazolam and gradually taper off so that you don't experience withdrawal.  The pain medication is adjusted to 2 pills twice a day.  I hope this helps.   Above all, I don't want you to experience another serious fall.  I'll be thinking of you and wishing you a safe and enjoyable holiday!  Let's get together in a month if possible.  Dr. [name]

## 2023-01-29 NOTE — Progress Notes (Signed)
72 y.o. [name] is here for follow-up of general functional decline following 2 separate falls months ago resulting in hip and wrist fractures, superimposed on chronic conditions including DM2 with complications, PAD, COPD, iron deficiency anemia, disordered sleep-wake cycle, and chronic multifactorial pain.  Since our last visit together on 12/23/2022, she has seen the hematologist for her chronic iron deficiency anemia who has advised oral iron supplement followed by infusion of needed for insufficient response.  No pathologic cause for her iron deficiency has been identified.  She has also seen her orthopedist; he will be renewing PT/OT for the hip and then for the hand.  He injected the left knee.  Foot xray to assess severe L plantar pain - bone spur?  Plantar fasciitis?  Advised cold application and plantar stretches.  She has yet to schedule with vascular.  Had eye appt completed.   Her most significant ongoing concern is of exacerbated chronic pain upon reduction in opioid dose, and alarm about reduction in frequency of alprazolam.  She has been miserable.  Her entire body hurts.  She continues to have persistent pain in her right wrist and left hip following healing.  Chronic conditions otherwise discussed in assessment and plan below.  Patient Active Problem List   Diagnosis Date Noted   Atrial fibrillation (HCC) 12/23/2022   Long term (current) use of anticoagulants 12/23/2022   History of atrial fibrillation 10/18/2022   Chronic anticoagulation 10/18/2022   History of fracture of left hip 10/17/2022   OSA (obstructive sleep apnea) 10/17/2022   Generalized pain 08/01/2022   Multifactorial functional impairment 02/13/2022   History of stroke without residual deficits 01/17/2022   Mild cognitive impairment with memory loss 12/23/2021   Diabetic peripheral neuropathy associated with type 2 diabetes mellitus (HCC) 11/11/2021   Recurrent falls 11/11/2021   Gait  instability 09/19/2021   Perennial non-allergic rhinitis 09/19/2021   Chronic, continuous use of opioids for chronic pain 07/10/2021   Polypharmacy 11/08/2020   Personal history of renal cell carcinoma 09/12/2020   Tinea pedis of both feet 08/07/2020   Nocturnal hypoxia per sleep study 07/2019    Carotid artery stenosis; s/p R endarterectomy    COPD mixed type (HCC)    Arthritis of both hands 07/01/2019   Pain due to onychomycosis of toenails of both feet 05/27/2019   Stage 2 chronic kidney disease due to type 2 diabetes mellitus (HCC) 12/16/2018   Lichen sclerosus of female genitalia 01/12/2017   Mixed stress and urge urinary incontinence 05/27/2016   Pulmonary hypertension due to chronic obstructive pulmonary disease (HCC) 04/25/2016   Asymptomatic cholelithiasis 09/25/2015   Chronic iron deficiency anemia 12/26/2014   Aortic atherosclerosis (HCC) 10/19/2014   Right nephrolithiasis, asymptomatic (incidental finding) 09/06/2014   Headache, chronic intermittent 01/14/2013   Chronic low back pain 10/06/2012   Long term current use of anticoagulant therapy 09/30/2012   Recurrent candidal intertrigo 07/28/2012   Tobacco use disorder 07/28/2012   History of colonic polyps 05/14/2011   Sleep disturbance 05/14/2011   Abdominal wall hernia 05/14/2011   Constipation due to opioid therapy 02/03/2011   Health care maintenance 11/04/2010   PAF (paroxysmal atrial fibrillation) (HCC) 10/22/2010   Chronic diastolic heart failure (HCC) 10/21/2010   Fibromyalgia 08/29/2010   Gastroesophageal reflux disease    Type 2 diabetes mellitus with other specified complication (HCC)    Osteoporosis with current pathological fracture with routine healing    Peripheral arterial disease (HCC) 05/27/2010   History of mitral valve replacement  with bioprosthetic valve 2012   Essential hypertension 06/19/2008   Hyperlipidemia 11/20/2005   Severe major depression (HCC) 11/19/2005    Current Outpatient  Medications:    ramelteon (ROZEREM) 8 MG tablet, Take 1 tablet (8 mg total) by mouth at bedtime., Disp: 30 tablet, Rfl: 3   albuterol (VENTOLIN HFA) 108 (90 Base) MCG/ACT inhaler, Inhale 2 puffs into the lungs every 6 (six) hours as needed for shortness of breath., Disp: 20.1 g, Rfl: 11   ALPRAZolam (XANAX) 0.5 MG tablet, Take 1 tablet (0.5 mg total) by mouth at bedtime. To help with sleep., Disp: 30 tablet, Rfl: 0   buPROPion (WELLBUTRIN XL) 150 MG 24 hr tablet, Take 1 tablet (150 mg total) by mouth every morning., Disp: 90 tablet, Rfl: 3   Continuous Glucose Sensor (FREESTYLE LIBRE 3 SENSOR) MISC, Place 1 sensor on the skin every 14 days. Use to check glucose 6 times daily as directed, Disp: 6 each, Rfl: 3   DULoxetine (CYMBALTA) 60 MG capsule, Take 1 capsule (60 mg total) by mouth 2 (two) times daily., Disp: 180 capsule, Rfl: 3   Fluticasone-Umeclidin-Vilant (TRELEGY ELLIPTA) 100-62.5-25 MCG/ACT AEPB, Inhale 1 puff into the lungs daily., Disp: 180 each, Rfl: 4   furosemide (LASIX) 80 MG tablet, Take 1 tablet (80 mg total) by mouth every morning., Disp: 90 tablet, Rfl: 3   gabapentin (NEURONTIN) 600 MG tablet, Take 1 tablet (600 mg total) by mouth at bedtime., Disp: 90 tablet, Rfl: 3   [START ON 02/05/2023] HYDROcodone-acetaminophen (NORCO/VICODIN) 5-325 MG tablet, Take 2 tablets by mouth 2 (two) times daily., Disp: 120 tablet, Rfl: 0   insulin degludec (TRESIBA FLEXTOUCH) 100 UNIT/ML FlexTouch Pen, Inject 44 Units into the skin daily., Disp: 13 mL, Rfl: 5   ipratropium (ATROVENT) 0.03 % nasal spray, Instill 2 sprays into the nose 3 (three) times daily as directed, Disp: 30 mL, Rfl: 2   ipratropium-albuterol (DUONEB) 0.5-2.5 (3) MG/3ML SOLN, Inhale 3 mLs by nebulization every 6 (six) hours as needed (shortness of breath, wheezing)., Disp: 90 mL, Rfl: 3   iron polysaccharides (NIFEREX) 150 MG capsule, Take 1 capsule (150 mg total) by mouth daily., Disp: 30 capsule, Rfl: 5   ketoconazole (NIZORAL) 2 %  cream, Apply 1 application topically at bedtime as needed for irritation., Disp: 60 g, Rfl: 5   lidocaine-prilocaine (EMLA) cream, Apply 1 Application topically as needed., Disp: 30 g, Rfl: 0   linaclotide (LINZESS) 72 MCG capsule, Take 1 capsule (72 mcg total) by mouth daily before breakfast., Disp: 90 capsule, Rfl: 3   metoprolol succinate (TOPROL XL) 25 MG 24 hr tablet, Take 1 tablet (25 mg total) by mouth daily., Disp: 30 tablet, Rfl: 11   omeprazole (PRILOSEC) 40 MG capsule, Take 1 capsule (40 mg total) by mouth 2 (two) times daily., Disp: 60 capsule, Rfl: 5   ondansetron (ZOFRAN) 4 MG tablet, Take 1 tablet (4 mg total) by mouth daily as needed for nausea or vomiting., Disp: 30 tablet, Rfl: 1   OXYGEN, Inhale 2 L into the lungs at bedtime., Disp: , Rfl:    polyethylene glycol (MIRALAX / GLYCOLAX) 17 g packet, Take 17 g by mouth daily as needed (use daily until BM become regular)., Disp: 14 each, Rfl: 0   potassium chloride SA (KLOR-CON M) 20 MEQ tablet, Take 1 tablet (20 mEq total) by mouth daily., Disp: 90 tablet, Rfl: 3   rosuvastatin (CRESTOR) 20 MG tablet, Take 1 tablet (20 mg total) by mouth at bedtime., Disp: 90 tablet,  Rfl: 3   Semaglutide, 1 MG/DOSE, (OZEMPIC, 1 MG/DOSE,) 4 MG/3ML SOPN, Inject 1 mg into the skin once a week., Disp: 3 mL, Rfl: 5   Spacer/Aero-Holding Chambers DEVI, Use as needed with inhaler, Disp: 1 kit, Rfl: 0   warfarin (COUMADIN) 2.5 MG tablet, Take 1 tablet (2.5 mg total) by mouth daily unless otherwise instructed, Disp: 90 tablet, Rfl: 3  Functional Status: Currently using rollator or cane to walk.  Able to get in/out of bed, and in/out of chair.  Getting in a chair/bed is easier than getting out.  Making it to the bathroom in time.  Now is receiving MOW, eating the entire meal, drinking high protein Ensures average one daily.  Her family members are assisting with organization of medications into Stage manager.  Sister Meriam Sprague has been provided transportation when  available.  Objective BP (!) 142/51 (BP Location: Left Arm, Patient Position: Sitting, Cuff Size: Small)   Pulse (!) 58   Temp 98.1 F (36.7 C) (Oral)   Ht 5\' 2"  (1.575 m)   Wt 144 lb 8 oz (65.5 kg)   SpO2 94%   BMI 26.43 kg/m  Exam: Ms. Alberici appears much more well today than at several previous visits - more alert, in no distress, although frustrated by pain.  No confusion. She does have chronic variable hand and mild head tremor.   Assessment and Plan:  Type 2 diabetes mellitus with other specified complication (HCC) Has started back using her sensor (following a hiatus after rehab) and is doing ok.  Is having asymptomatic morning lows, but less since starting Ensure.  Not yet due for A1C which was 7.7 in 11/2022 (check next visit).  Prescribed insulin degludec 44 units daily and semaglutide 1 mg weekly. Has completed eye exam; no retinopathy per her report.  Generalized pain Severe pain "all over". Multiple known sources of chronic pain and experiencing residual pain of healed right wrist and left hip fractures.  Left plantar pain has been attributed by orthopedist to plantar fasciitis.  Opioid dosing was decreased during and subsequent to posthospitalization rehab-she is currently taking hydrocodone-acetaminophen 5-325 one 3 times daily.  In the past, she has required 2 tablets for effective (though incomplete) pain relief.  Will adjust her dosing to 2 tabs twice daily.  She will continue on duloxetine 60 mg twice daily.  Recurrent falls No falls since her last visit.  She has been receiving PT and anxiolytic and opioid dosing have been decreased.  Polypharmacy Addressed in separate problem documentation  Sleep disturbance Alprazolam has been decreased to 0.5 mg 1 nightly only.  I discussed with her the concern in combination with opioids.  She has been on benzodiazepines for decades and is understandably concerned about the possibility of withdrawal with cessation.  History of  sleepwalking.  Avoid Ambien.  Trial ramelteon 8 mg nightly.  Goal is to find alternative to benzo with concern for safety and preservation of cognition.  Essential hypertension 142/51 today on Lasix 80 mg daily, metoprolol 25 mg daily.   No changes as she has been controlled very recently on same regimen.    Chronic iron deficiency anemia She has completed consultation and evaluation for iron deficiency anemia with hematologist.  No pathological cause identified.  Oral iron supplement has been initiated.    Return in about 4 weeks (around 02/26/2023) for cognitive re-check for new medication, symptom re-check, chronic condition monitoring.

## 2023-01-30 ENCOUNTER — Ambulatory Visit: Payer: Self-pay | Admitting: *Deleted

## 2023-01-30 ENCOUNTER — Other Ambulatory Visit (HOSPITAL_COMMUNITY): Payer: Self-pay

## 2023-01-30 MED ORDER — TRESIBA FLEXTOUCH 100 UNIT/ML ~~LOC~~ SOPN
44.0000 [IU] | PEN_INJECTOR | Freq: Every day | SUBCUTANEOUS | 5 refills | Status: DC
  Filled 2023-01-30: qty 13, 29d supply, fill #0
  Filled 2023-02-09: qty 15, 34d supply, fill #0

## 2023-01-30 NOTE — Assessment & Plan Note (Signed)
Severe pain "all over". Multiple known sources of chronic pain and experiencing residual pain of healed right wrist and left hip fractures.  Left plantar pain has been attributed by orthopedist to plantar fasciitis.  Opioid dosing was decreased during and subsequent to posthospitalization rehab-she is currently taking hydrocodone-acetaminophen 5-325 one 3 times daily.  In the past, she has required 2 tablets for effective (though incomplete) pain relief.  Will adjust her dosing to 2 tabs twice daily.  She will continue on duloxetine 60 mg twice daily.

## 2023-01-30 NOTE — Assessment & Plan Note (Addendum)
Has started back using her sensor (following a hiatus after rehab) and is doing ok.  Is having asymptomatic morning lows, but less since starting Ensure.  Not yet due for A1C which was 7.7 in 11/2022 (check next visit).  Prescribed insulin degludec 44 units daily and semaglutide 1 mg weekly. Has completed eye exam; no retinopathy per her report.

## 2023-01-30 NOTE — Patient Outreach (Signed)
  Care Coordination   Follow Up Visit Note   01/30/2023 Name: [name] MRN: [id number] DOB: Sep 11, 1950  [name] is a 72 y.o. year old female who sees [name], [name], MD for primary care. I spoke with  [name] by phone today.  What matters to the patients health and wellness today?  Doing better (mental health), worried about not sleeping with med changes.    SDOH assessments and interventions completed:  Yes     Care Coordination Interventions:  Yes, provided  Interventions Today    Flowsheet Row Most Recent Value  Chronic Disease   Chronic disease during today's visit Other  [Pt reports having pain on arm with recent fx and surgery]  General Interventions   General Interventions Discussed/Reviewed Community Resources  Exercise Interventions   Exercise Discussed/Reviewed Exercise Discussed, Exercise Reviewed  [continue with HHPT]  Education Interventions   Provided Verbal Education On Mental Health/Coping with Illness, Air traffic controller, Community Resources  Mental Health Interventions   Mental Health Discussed/Reviewed Mental Health Discussed  [Pt declines need for counseling- "did it for 7 years". She knows how to reach out for urgent help (calling [phone]) and to get PCP to refer back to [place] if needs arise.]  Nutrition Interventions   Nutrition Discussed/Reviewed Nutrition Discussed  [continue with meals on wheels]  Pharmacy Interventions   Pharmacy Dicussed/Reviewed Pharmacy Topics Discussed  [Pt is worried about her sleeping pattern with the weaning off of Xanax- PCP is working with her to taper off and transition to sleeping pill which she is concerned may make her a higher fall risk]  Safety Interventions   Safety Discussed/Reviewed Safety Discussed       Follow up plan: No further intervention required.   Encounter Outcome:  Patient Visit Completed

## 2023-01-30 NOTE — Telephone Encounter (Signed)
Return call to [facility] PT - informed to call MD if weight is lower than 135 lbs.

## 2023-01-30 NOTE — Assessment & Plan Note (Signed)
No falls since her last visit.  She has been receiving PT and anxiolytic and opioid dosing have been decreased.

## 2023-01-30 NOTE — Patient Instructions (Signed)
Visit Information  Thank you for taking time to visit with me today. Please don't hesitate to contact me if I can be of assistance to you.     Please call your PCP  or our care guide team at [phone] if you need to reconnect with [place] and to schedule an appointment.   If you are experiencing a Mental Health or Behavioral Health Crisis or need someone to talk to, please call the Suicide and Crisis Lifeline: 988 call 911   The patient verbalized understanding of instructions, educational materials, and care plan provided today and DECLINED offer to receive copy of patient instructions, educational materials, and care plan.   No further follow up required:    Reece Levy, MSW, LCSW [facility], [facility] Licensed Clinical Social Worker Care Coordinator  [phone]

## 2023-01-30 NOTE — Assessment & Plan Note (Signed)
She has completed consultation and evaluation for iron deficiency anemia with hematologist.  No pathological cause identified.  Oral iron supplement has been initiated.

## 2023-01-30 NOTE — Assessment & Plan Note (Signed)
Addressed in separate problem documentation

## 2023-01-30 NOTE — Assessment & Plan Note (Signed)
Alprazolam has been decreased to 0.5 mg 1 nightly only.  I discussed with her the concern in combination with opioids.  She has been on benzodiazepines for decades and is understandably concerned about the possibility of withdrawal with cessation.  History of sleepwalking.  Avoid Ambien.  Trial ramelteon 8 mg nightly.  Goal is to find alternative to benzo with concern for safety and preservation of cognition.

## 2023-01-30 NOTE — Assessment & Plan Note (Signed)
142/51 today on Lasix 80 mg daily, metoprolol 25 mg daily.   No changes as she has been controlled very recently on same regimen.

## 2023-02-02 ENCOUNTER — Other Ambulatory Visit (HOSPITAL_COMMUNITY): Payer: Self-pay

## 2023-02-05 ENCOUNTER — Other Ambulatory Visit (HOSPITAL_COMMUNITY): Payer: Self-pay

## 2023-02-05 ENCOUNTER — Other Ambulatory Visit: Payer: Self-pay

## 2023-02-05 ENCOUNTER — Encounter (HOSPITAL_COMMUNITY): Payer: Self-pay | Admitting: Pharmacist

## 2023-02-05 ENCOUNTER — Other Ambulatory Visit (HOSPITAL_BASED_OUTPATIENT_CLINIC_OR_DEPARTMENT_OTHER): Payer: Self-pay

## 2023-02-06 ENCOUNTER — Other Ambulatory Visit: Payer: Self-pay

## 2023-02-09 ENCOUNTER — Other Ambulatory Visit: Payer: Self-pay

## 2023-02-09 NOTE — Telephone Encounter (Signed)
Pt. reports value of PST FS POC INR for today = 1.80 (Target INR range 2.0 -2.5 as per Dr. [name]). No missed doses. Will INCREASE dose to 2.5 mg tablet, Monday through [facility]; only 1/2 x 2.5 mg (1.25mg  dose) on SUN. Repeat INR [id number].

## 2023-02-13 ENCOUNTER — Telehealth: Payer: Self-pay

## 2023-02-13 NOTE — Telephone Encounter (Signed)
 RTC to [place] , OT [facility] requesting Verbal Orders for 1 time a week for 3 weeks. Need to continue fall prevention. Verbal approval given will forward to Dr. [name] for approval or denial.

## 2023-02-13 NOTE — Telephone Encounter (Signed)
 Will with [facility] requesting VO for OT. Please call back @ [phone].

## 2023-02-16 ENCOUNTER — Telehealth: Payer: Self-pay | Admitting: Pharmacist

## 2023-02-16 DIAGNOSIS — I270 Primary pulmonary hypertension: Secondary | ICD-10-CM | POA: Diagnosis not present

## 2023-02-16 DIAGNOSIS — J449 Chronic obstructive pulmonary disease, unspecified: Secondary | ICD-10-CM | POA: Diagnosis not present

## 2023-02-16 DIAGNOSIS — I509 Heart failure, unspecified: Secondary | ICD-10-CM | POA: Diagnosis not present

## 2023-02-16 DIAGNOSIS — J439 Emphysema, unspecified: Secondary | ICD-10-CM | POA: Diagnosis not present

## 2023-02-16 DIAGNOSIS — Z7901 Long term (current) use of anticoagulants: Secondary | ICD-10-CM | POA: Diagnosis not present

## 2023-02-16 DIAGNOSIS — I480 Paroxysmal atrial fibrillation: Secondary | ICD-10-CM | POA: Diagnosis not present

## 2023-02-16 NOTE — Telephone Encounter (Signed)
 Pt. provides results of PST FS POC INR = 2.5 on 2.5 mg warfarin daily EXCEPT on Sunday, taking only 1.25mg . Advised to CHANGE regimen to: 1x2.5 mg M-F; 1/2 x 2.5 mg (1.25 mg) on Sat/Sun. Repeat INR [id number]. No bleeding. No new meds.

## 2023-02-17 ENCOUNTER — Other Ambulatory Visit: Payer: Self-pay

## 2023-02-17 ENCOUNTER — Telehealth: Payer: Self-pay | Admitting: *Deleted

## 2023-02-17 NOTE — Telephone Encounter (Signed)
 Call from [place], [place] [facility]  With patient states does not feeling well today.  Refused to walk.  Pain is a 8 out of 10 Generalized and in both feet and legs.  B/P today was 172/62 just took her B/P 30 minutes prior.  Has a runny nose and congestion.  No fevers.  Tommy wanted to report to doctor.  Message to be sent to Dr. Trudy.

## 2023-02-18 ENCOUNTER — Other Ambulatory Visit (HOSPITAL_COMMUNITY): Payer: Self-pay

## 2023-02-18 ENCOUNTER — Encounter (HOSPITAL_COMMUNITY): Payer: Self-pay

## 2023-02-18 ENCOUNTER — Other Ambulatory Visit: Payer: Self-pay | Admitting: Internal Medicine

## 2023-02-18 ENCOUNTER — Other Ambulatory Visit: Payer: Self-pay

## 2023-02-18 DIAGNOSIS — J310 Chronic rhinitis: Secondary | ICD-10-CM

## 2023-02-18 MED ORDER — IPRATROPIUM BROMIDE 0.03 % NA SOLN
2.0000 | Freq: Three times a day (TID) | NASAL | 2 refills | Status: DC
  Filled 2023-02-18: qty 30, 50d supply, fill #0
  Filled 2023-04-06: qty 30, 50d supply, fill #1
  Filled 2023-06-10: qty 30, 50d supply, fill #2

## 2023-02-19 ENCOUNTER — Ambulatory Visit: Payer: PPO | Admitting: Internal Medicine

## 2023-02-19 DIAGNOSIS — R0981 Nasal congestion: Secondary | ICD-10-CM | POA: Diagnosis not present

## 2023-02-19 NOTE — Progress Notes (Signed)
 [facility] Residency Telephone Encounter Continuity Care Appointment  HPI:  This telephone encounter was created for [name] on 02/19/2023 for the following purpose/cc :  to address cough and cold symptoms. About 4 days ago developed runny nose and sinus pain (maxillaries both sides)- hadn't been exposed to any visitors and hadn't been out of the house.  No fever cough chills or sore throat.  Someone queried air filters, which were very filthy and changed; changed filter in CPAP. Has used a saline rinse.  Starting to feel a bit better.  Clear rhinirrhea but nasal passages are open.  Is not sure if she has a sinus infection but wanted to know what medicines would be safe for her symptoms.    Feels like sister is minimizing contact.    New sleep medicine; rozerem  not terribly effective, she has used it a few times.  Describes no problem going to sleep but not staying asleep.  Continues to have disrupted sleep with napping throughout the day and night cycle.  Has tried taking xanax  to go to sleep, then rozerem  when she awakens.  She still does not think it has been effective.  Continues to feel pain in her healed fractures of the right wrist and left hip.   Patient Active Problem List   Diagnosis Date Noted   Congestion of paranasal sinus 02/19/2023   Atrial fibrillation (HCC) 12/23/2022   Long term (current) use of anticoagulants 12/23/2022   History of atrial fibrillation 10/18/2022   Chronic anticoagulation 10/18/2022   History of fracture of left hip 10/17/2022   OSA (obstructive sleep apnea) 10/17/2022   Generalized pain 08/01/2022   Multifactorial functional impairment 02/13/2022   History of stroke without residual deficits 01/17/2022   Mild cognitive impairment with memory loss 12/23/2021   Diabetic peripheral neuropathy associated with type 2 diabetes mellitus (HCC) 11/11/2021   Recurrent falls 11/11/2021   Gait instability 09/19/2021    Perennial non-allergic rhinitis 09/19/2021   Chronic, continuous use of opioids for chronic pain 07/10/2021   Polypharmacy 11/08/2020   Personal history of renal cell carcinoma 09/12/2020   Tinea pedis of both feet 08/07/2020   Nocturnal hypoxia per sleep study 07/2019    Carotid artery stenosis; s/p R endarterectomy    COPD mixed type (HCC)    Arthritis of both hands 07/01/2019   Pain due to onychomycosis of toenails of both feet 05/27/2019   Stage 2 chronic kidney disease due to type 2 diabetes mellitus (HCC) 12/16/2018   Lichen sclerosus of female genitalia 01/12/2017   Mixed stress and urge urinary incontinence 05/27/2016   Pulmonary hypertension due to chronic obstructive pulmonary disease (HCC) 04/25/2016   Asymptomatic cholelithiasis 09/25/2015   Chronic iron  deficiency anemia 12/26/2014   Aortic atherosclerosis (HCC) 10/19/2014   Right nephrolithiasis, asymptomatic (incidental finding) 09/06/2014   Headache, chronic intermittent 01/14/2013   Chronic low back pain 10/06/2012   Long term current use of anticoagulant therapy 09/30/2012   Recurrent candidal intertrigo 07/28/2012   Tobacco use disorder 07/28/2012   History of colonic polyps 05/14/2011   Sleep disturbance 05/14/2011   Abdominal wall hernia 05/14/2011   Constipation due to opioid therapy 02/03/2011   Health care maintenance 11/04/2010   PAF (paroxysmal atrial fibrillation) (HCC) 10/22/2010   Chronic diastolic heart failure (HCC) 10/21/2010   Fibromyalgia 08/29/2010   Gastroesophageal reflux disease    Type 2 diabetes mellitus with other specified complication (HCC)    Osteoporosis with current pathological fracture with routine healing  Peripheral arterial disease (HCC) 05/27/2010   History of mitral valve replacement with bioprosthetic valve 2012   Essential hypertension 06/19/2008   Hyperlipidemia 11/20/2005   Severe major depression (HCC) 11/19/2005    Assessment / Plan / Recommendations:  Please see  A&P under problem oriented charting for assessment of the patient's acute and chronic medical conditions.  As always, pt is advised that if symptoms worsen or new symptoms arise, they should go to an urgent care facility or to to ER for further evaluation.   Congestion of paranasal sinus Clear rhinorrhea, open nasal passages, discomfort in bilateral maxillary sinuses.  No fever or chill, no cough or shortness of breath.  No sore throat.  Recommended continuation of nasal rinses, application of Vicks VapoRub beneath the nose, and Coricidin HBP OTC to aid in symptoms.  We have a visit scheduled at the end of this month for follow-up of her chronic issues.  Consent and Medical Decision Making:   This is a telephone encounter between [name] and Mliss Arlean Pouch on 02/19/2023 for discussion of sinus and nasal symptoms. The visit was conducted with the patient located at home and Mliss Arlean Pouch at [facility]. The patient's identity was confirmed using their DOB and current address. The patient has consented to being evaluated through a telephone encounter and understands the associated risks (an examination cannot be done and the patient may need to come in for an appointment) / benefits (allows the patient to remain at home, decreasing exposure to coronavirus). I personally spent 12 minutes on medical discussion.

## 2023-02-19 NOTE — Assessment & Plan Note (Signed)
 Clear rhinorrhea, open nasal passages, discomfort in bilateral maxillary sinuses.  No fever or chill, no cough or shortness of breath.  No sore throat.  Recommended continuation of nasal rinses, application of Vicks VapoRub beneath the nose, and Coricidin HBP OTC to aid in symptoms.  We have a visit scheduled at the end of this month for follow-up of her chronic issues.

## 2023-02-23 ENCOUNTER — Telehealth: Payer: Self-pay | Admitting: *Deleted

## 2023-02-23 ENCOUNTER — Telehealth: Payer: Self-pay | Admitting: Pharmacist

## 2023-02-23 NOTE — Telephone Encounter (Signed)
 Pt reports PST FS POC INR result of 2.60 (Target range 2.0 - 2.5) while taking 2.5 mg warfarin M-F; 1.25 mg (1/2 x 2.5 mg tablet) on Sa/Su. Instructed to change dosing as follows: 1 x 2.5 mg Su/Tu/Th/Sa; 1/2 x 2.5 mg (1.25mg  dose) on MWF; repeat INR 20JAN. No bleeding, no new medications, no missed doses per patient.

## 2023-02-23 NOTE — Telephone Encounter (Signed)
 Call from [place] PT with [facility]. Reporting pt c/o pain 10 out of 10; not wearing her oxygen, O2 sats drop between 87 - 88% (and pt continues to smoke); and pt told her she's unsure if she 2 Furosemide tabs.

## 2023-02-24 ENCOUNTER — Other Ambulatory Visit: Payer: Self-pay

## 2023-02-26 ENCOUNTER — Encounter: Payer: Self-pay | Admitting: Internal Medicine

## 2023-02-26 NOTE — Telephone Encounter (Signed)
Call from pt who stated she read the My Chart message from [facility] and it's not entirely true. Stated she did fall but the 10 out of 10 pain was in general not the hip. And her pain is not this high at this time. Stated no c/o left hip pain today (it did hurt a little yesterday); she's able to walk. I mentioned about going to the ER for x-rays; stated she's not going and there's no need.

## 2023-02-27 DIAGNOSIS — G4733 Obstructive sleep apnea (adult) (pediatric): Secondary | ICD-10-CM | POA: Diagnosis not present

## 2023-03-03 ENCOUNTER — Other Ambulatory Visit: Payer: Self-pay

## 2023-03-03 ENCOUNTER — Telehealth: Payer: Self-pay | Admitting: *Deleted

## 2023-03-03 DIAGNOSIS — F5104 Psychophysiologic insomnia: Secondary | ICD-10-CM

## 2023-03-03 DIAGNOSIS — G8929 Other chronic pain: Secondary | ICD-10-CM

## 2023-03-03 NOTE — Telephone Encounter (Signed)
Call from patient wanted to inform Dr. [name] that she had fallen again at 4 AM this morning.  Patient thinks she is ok.  Wanted doctor to know that her sugars have been bottoming out and got to 50 3 days ago.  Has since stopped taking her Insulin and sugars have been around 151.  Is in a lot of pain .  When she fell she hit her entire body except for her head.  States her legs are getting worse and are giving out.  Wanted to reinforce that the wrong information was given by PT about her last fall.  Fall had nothing to do with her 10/10 pain level.  Got up she said and fell backwards and landed on her bottom and then hit her head.Would like to have a telephone visit with Dr. [name].  Refused appointment with Residents as she stated that Dr. [name] knows what is going on.  Spoke with Dr. [name] who is unavailable for an appointment with patient today.  Will call patient possibly by tomorrow.  Patient was encouraged to take continue taking her blood sugars and have results available at phone from Dr. [name].  Patient voiced understanding of.

## 2023-03-03 NOTE — Telephone Encounter (Signed)
Pt provided results of PST FS POC INR = 2.6 on 2.5 mg Su/Tu/Th/Sa; 1/2 x 2.5 mg = 1.25 mg on MWF. No missed or extra doses. Decreased to 2.5 mg on Th/Sa/Su; 1/2 x 2.5mg  (1.25mg ) om Mo/Tu/We/Fri. Repeat INR 27-JAN-25.

## 2023-03-03 NOTE — Telephone Encounter (Signed)
Stopped taking insulin 3-4 days ago, was having lows, went down to 50s. Asks for a 2 week print out from her [name] given to Dr. [name]. Woke up with a low every time she went to sleep. Better now off insulin. 14 day report given to Dr. [name].

## 2023-03-04 ENCOUNTER — Telehealth: Payer: Self-pay | Admitting: *Deleted

## 2023-03-04 NOTE — Telephone Encounter (Signed)
Call from Will, PT [facility].  Called to report that patient fell again last night.  Was bending over to kill a bug.  Pain is a 9 out of 10 all over.  Knocked over Toilet seat and pushed back a table.  Patient is awaiting call from Dr. [name].  Was in fact in good spirits during conversation.

## 2023-03-05 ENCOUNTER — Telehealth: Payer: Self-pay | Admitting: Internal Medicine

## 2023-03-05 ENCOUNTER — Other Ambulatory Visit: Payer: Self-pay

## 2023-03-05 ENCOUNTER — Other Ambulatory Visit: Payer: Self-pay | Admitting: Internal Medicine

## 2023-03-05 ENCOUNTER — Other Ambulatory Visit (HOSPITAL_COMMUNITY): Payer: Self-pay

## 2023-03-05 DIAGNOSIS — F5104 Psychophysiologic insomnia: Secondary | ICD-10-CM

## 2023-03-05 MED ORDER — ALPRAZOLAM 0.5 MG PO TABS
0.5000 mg | ORAL_TABLET | Freq: Every day | ORAL | 0 refills | Status: DC
  Filled 2023-03-05: qty 30, 30d supply, fill #0

## 2023-03-05 MED ORDER — HYDROCODONE-ACETAMINOPHEN 5-325 MG PO TABS
2.0000 | ORAL_TABLET | Freq: Two times a day (BID) | ORAL | 0 refills | Status: DC
  Filled 2023-03-05 – 2023-03-11 (×2): qty 120, 30d supply, fill #0

## 2023-03-05 NOTE — Telephone Encounter (Signed)
Call returned.

## 2023-03-05 NOTE — Telephone Encounter (Signed)
Call returned to [name] regarding her low blood sugars, self-adjusted insulin, falls (lost balance; once while reaching down to kill a bug), and ongoing severe pain.  She has a f/u appt with me in one week as well.  [facility]PT calls our office to update on falls and pain as per their protocol.  Falls due to losing balance.  Preventable? Yes.  Legs hurting all the time. Legs hurt with equal intensity. The healed L fracture leg pain eases with pain medication but it never resolves; worse with weight bearing, feels deep and aching.  She can't localize the pain and wonders if it is fibromyalgia.   RLE also with deep aching pain which doesn't worsen with standing.    The left foot also hurts;  Has been using a band for exercise that PT is working on (her ortho had suspected plantar fasciitis).   We both agree that muscle relaxants are not an option.  She doesn't have transportation to get [facility].  We discussed new pain medications in the pipeline though not available yet.  Weight loss I no longer desired and insuling resistance has decreased; Will stop Ozempic and stop long acting insulin.  Recheck next week.

## 2023-03-06 ENCOUNTER — Other Ambulatory Visit (HOSPITAL_COMMUNITY): Payer: Self-pay

## 2023-03-08 ENCOUNTER — Other Ambulatory Visit (HOSPITAL_COMMUNITY): Payer: Self-pay

## 2023-03-09 ENCOUNTER — Telehealth: Payer: Self-pay | Admitting: *Deleted

## 2023-03-09 ENCOUNTER — Other Ambulatory Visit: Payer: Self-pay

## 2023-03-09 NOTE — Telephone Encounter (Signed)
Call from Tommy Pt with [facility] who is currently in the home, stated pt had a fall 3 -4 days ago per pt; felled in her bedroom, hit her bottom.  Pt c/o soreness when she sits. Also Tommy reporting pt's weight of 136.2lbs which is outside of parameters of 138 - 148lbs (if parameters need to be changed,let them know). Pt has an appt Thursday 1/30. Thanks

## 2023-03-09 NOTE — Telephone Encounter (Signed)
Pt texted results of PST FS POC INR determination = 2.70 (Target range per Dr. [name] is 2.0 - 2.5) on 12.5 mg warfarin/wk. Will REDUCE to 10 mg warfarin/wk. Repeat INR 3-FEB-25. No bleeding, no chest pain, no dyspnea. No new medications. No missed doses. No extra doses.

## 2023-03-12 ENCOUNTER — Encounter: Payer: Self-pay | Admitting: Internal Medicine

## 2023-03-12 ENCOUNTER — Other Ambulatory Visit (HOSPITAL_COMMUNITY): Payer: Self-pay

## 2023-03-12 ENCOUNTER — Ambulatory Visit (INDEPENDENT_AMBULATORY_CARE_PROVIDER_SITE_OTHER): Payer: PPO | Admitting: Internal Medicine

## 2023-03-12 ENCOUNTER — Other Ambulatory Visit: Payer: Self-pay

## 2023-03-12 VITALS — BP 135/42 | HR 56 | Temp 97.5°F | Ht 62.0 in | Wt 135.7 lb

## 2023-03-12 DIAGNOSIS — E876 Hypokalemia: Secondary | ICD-10-CM

## 2023-03-12 DIAGNOSIS — R296 Repeated falls: Secondary | ICD-10-CM

## 2023-03-12 DIAGNOSIS — I110 Hypertensive heart disease with heart failure: Secondary | ICD-10-CM

## 2023-03-12 DIAGNOSIS — R2681 Unsteadiness on feet: Secondary | ICD-10-CM

## 2023-03-12 DIAGNOSIS — E860 Dehydration: Secondary | ICD-10-CM

## 2023-03-12 DIAGNOSIS — R52 Pain, unspecified: Secondary | ICD-10-CM | POA: Diagnosis not present

## 2023-03-12 DIAGNOSIS — Z8673 Personal history of transient ischemic attack (TIA), and cerebral infarction without residual deficits: Secondary | ICD-10-CM

## 2023-03-12 DIAGNOSIS — G250 Essential tremor: Secondary | ICD-10-CM

## 2023-03-12 DIAGNOSIS — D509 Iron deficiency anemia, unspecified: Secondary | ICD-10-CM | POA: Diagnosis not present

## 2023-03-12 DIAGNOSIS — F322 Major depressive disorder, single episode, severe without psychotic features: Secondary | ICD-10-CM

## 2023-03-12 DIAGNOSIS — E1169 Type 2 diabetes mellitus with other specified complication: Secondary | ICD-10-CM | POA: Diagnosis not present

## 2023-03-12 DIAGNOSIS — R634 Abnormal weight loss: Secondary | ICD-10-CM

## 2023-03-12 DIAGNOSIS — I5032 Chronic diastolic (congestive) heart failure: Secondary | ICD-10-CM | POA: Diagnosis not present

## 2023-03-12 DIAGNOSIS — Z85528 Personal history of other malignant neoplasm of kidney: Secondary | ICD-10-CM

## 2023-03-12 DIAGNOSIS — I2723 Pulmonary hypertension due to lung diseases and hypoxia: Secondary | ICD-10-CM | POA: Diagnosis not present

## 2023-03-12 DIAGNOSIS — M8000XD Age-related osteoporosis with current pathological fracture, unspecified site, subsequent encounter for fracture with routine healing: Secondary | ICD-10-CM | POA: Diagnosis not present

## 2023-03-12 DIAGNOSIS — Z8679 Personal history of other diseases of the circulatory system: Secondary | ICD-10-CM

## 2023-03-12 DIAGNOSIS — I10 Essential (primary) hypertension: Secondary | ICD-10-CM

## 2023-03-12 HISTORY — DX: Adverse effect of unspecified drugs, medicaments and biological substances, initial encounter: R63.4

## 2023-03-12 LAB — POCT GLYCOSYLATED HEMOGLOBIN (HGB A1C): Hemoglobin A1C: 6.1 % — AB (ref 4.0–5.6)

## 2023-03-12 LAB — GLUCOSE, CAPILLARY: Glucose-Capillary: 193 mg/dL — ABNORMAL HIGH (ref 70–99)

## 2023-03-12 MED ORDER — KETOROLAC TROMETHAMINE 30 MG/ML IJ SOLN: 30.0000 mg | Freq: Once | INTRAMUSCULAR | Status: DC

## 2023-03-12 MED ORDER — VENLAFAXINE HCL ER 75 MG PO CP24
75.0000 mg | ORAL_CAPSULE | Freq: Every day | ORAL | 11 refills | Status: DC
  Filled 2023-03-12: qty 30, 30d supply, fill #0
  Filled 2023-04-06: qty 30, 30d supply, fill #1

## 2023-03-12 MED ORDER — KETOROLAC TROMETHAMINE 60 MG/2ML IM SOLN
30.0000 mg | Freq: Once | INTRAMUSCULAR | Status: AC
  Administered 2023-03-12: 30 mg via INTRAMUSCULAR

## 2023-03-12 MED ORDER — METOPROLOL SUCCINATE ER 25 MG PO TB24
12.5000 mg | ORAL_TABLET | Freq: Every day | ORAL | 3 refills | Status: DC
  Filled 2023-03-12 – 2023-03-13 (×2): qty 45, 90d supply, fill #0

## 2023-03-12 MED ORDER — FUROSEMIDE 40 MG PO TABS
40.0000 mg | ORAL_TABLET | Freq: Every day | ORAL | 3 refills | Status: DC
  Filled 2023-03-12: qty 90, 90d supply, fill #0

## 2023-03-12 MED ORDER — POTASSIUM CHLORIDE CRYS ER 10 MEQ PO TBCR
10.0000 meq | EXTENDED_RELEASE_TABLET | Freq: Every day | ORAL | 11 refills | Status: AC
  Filled 2023-03-12: qty 30, 30d supply, fill #0
  Filled 2023-04-06: qty 30, 30d supply, fill #1
  Filled 2023-05-06: qty 30, 30d supply, fill #2
  Filled 2023-06-10: qty 30, 30d supply, fill #3
  Filled 2023-08-16: qty 30, 30d supply, fill #4
  Filled 2023-09-13: qty 30, 30d supply, fill #5
  Filled 2023-10-13: qty 30, 30d supply, fill #6
  Filled 2023-11-25: qty 30, 30d supply, fill #7
  Filled 2024-01-04: qty 30, 30d supply, fill #8
  Filled 2024-02-02: qty 30, 30d supply, fill #9
  Filled 2024-03-01: qty 30, 30d supply, fill #10

## 2023-03-12 NOTE — Assessment & Plan Note (Signed)
Requests a new rollator.  PT is coming, she continues to fall which is likely multifactorial (meds, pain, stiffness, deconditioning, frailty, sleepiness, diminished safety awareness).  She has been referred from prior hospitalization to neurology for evaluation of gait instability; appt is upcoming.

## 2023-03-12 NOTE — Assessment & Plan Note (Signed)
CT abdomen and pelvis without contrast completed in 10/2022 for unrelated reasons.  "Stable old cryoablation defect involving the lower pole of the right kidney. No evidence of suspicious renal masses or hydronephrosis".

## 2023-03-12 NOTE — Patient Instructions (Addendum)
Your diabetes is under excellent control, despite stopping the insulin and the Ozempic.  We'll keep an eye on it and recheck in 67M.  Your pain and depression medicines are not working well for you.  I'd like you to try an alternative to Cymbalta/duloxetine called Effexor/venlafaxine.  You'll take one tablet every day.  A dramatic change isn't expected, but we are looking for any improvement.  The pain is largely due to fibromyalgia which is very challenging to live with, and to treat.    We're checking your blood counts, iron, and potassium today.    You appear to be dehydrated and I am reducing your lasix (and your potassium) to avoid losing excessive fluid.  Your blood pressure is gradually requiring less medicine as well.  We will be taking 1/2 the Toprol once a day rather than a whole tab.  Try using your oxygen at night, along with the humidifier.  I'll message you on MyChart with your results (give me until early next week) and any additional instructions.    Keep on keeping on.  Don't fall anymore please.   Dr. [name]

## 2023-03-12 NOTE — Assessment & Plan Note (Signed)
>>  ASSESSMENT AND PLAN FOR HISTORY OF ATRIAL FIBRILLATION WRITTEN ON 03/12/2023  1:53 PM BY Kobie Whidby ANNE, MD  Currently heart auscultates irreg irreg. Rate controlled on metoprolol  (decrease dose today from 25 to 12.5 mg daily given episodes of bradycardia on pulse check today (down to 50s) and in setting of volume depletion. Anticoagulated. Asymptomatic.

## 2023-03-12 NOTE — Assessment & Plan Note (Addendum)
PHQ9 today 14.  Negative impact on quality of life due to her chronic pain, chronic conditions, and social/family situations are prominent drivers.  Currently on bupropion 150 mg daily and duloxetine 60 mg twice daily.  I would first like to change the duloxetine to a different SNRI (venlafaxine) and titrate as tolerated, with consideration of increasing bupropion in the future as well.

## 2023-03-12 NOTE — Assessment & Plan Note (Signed)
Murmur notable over the left upper sternal border.  No clinical manifestations at this time (no JVD, no LE edema).  She is volume depleted on her daily 80 mg Lasix.  Will reduce dose to 40 mg daily, with compensatory decrease in potassium supplement.

## 2023-03-12 NOTE — Progress Notes (Signed)
73 y.o. [name] is here for routine follow-up of multiple chronic conditions, our last visit being a month ago.  Since last visit patient has no other medical visits, though she has a number of them coming up.    Generalized pain continues.    Persistent pain on both buttocks after a fall a couple of weeks ago, though has been able to bear weight..  Still off insulin, no further lows, doing ok, will recheck A1c today.  Losing weight- we recently stopped Ozempic as no further weight loss is recommended.  Thirsty all the time, chewing ice.    Has appt with vascular and vein in April.  Going to podiatrist tmro.    No sense of palpitations or racing.  Sometimes has anterior chest discomfort, twinges, which she notices mostly at night; thinks it's deep, mostly along right sternal border.  Breathing is fair; not getting her smaller albuterol inhaler as she'd hoped, can't afford a spacer (60$); the current albuterol is too large, she needs to use both hands, which is more difficult. Nighttimes feel worse, dyspnea (hasn't been using her nocturnal 02), but less cough.  Occasional cough during day. Hasn't used her CPAP; using a humifier at night which helped immensely, more than the CPAP.  She may start using the 02 at night alone.  Bowels became constipated after starting Fe despite the Linzess; she stopped the Fe (a couple of months ago?),  had episode of manual disimpaction; now having formed Bms (not hard) intermittently with loose stools, preceded by cramping. No black or red color.  Acid reflux symptoms are controlled on medication.  Diffuse pain never goes away and is usually at least 9/10.  She would like to increase opioid to tid.    Patient Active Problem List   Diagnosis Date Noted   Weight loss 03/12/2023   Atrial fibrillation (HCC) 12/23/2022   History of atrial fibrillation 10/18/2022   Chronic anticoagulation 10/18/2022   History of fracture of left hip 10/17/2022    Generalized pain 08/01/2022   Multifactorial functional impairment 02/13/2022   History of stroke without residual deficits 01/17/2022   Mild cognitive impairment with memory loss 12/23/2021   Diabetic peripheral neuropathy associated with type 2 diabetes mellitus (HCC) 11/11/2021   Recurrent falls 11/11/2021   Gait instability 09/19/2021   Perennial non-allergic rhinitis 09/19/2021   Chronic, continuous use of opioids for chronic pain 07/10/2021   Polypharmacy 11/08/2020   Personal history of renal cell carcinoma 09/12/2020   Tinea pedis of both feet 08/07/2020   Essential tremor 11/17/2019   Nocturnal hypoxia per sleep study 07/2019    Carotid artery stenosis; s/p R endarterectomy    COPD mixed type (HCC)    Arthritis of both hands 07/01/2019   Stage 2 chronic kidney disease due to type 2 diabetes mellitus (HCC) 12/16/2018   Lichen sclerosus of female genitalia 01/12/2017   Mixed stress and urge urinary incontinence 05/27/2016   Pulmonary hypertension due to chronic obstructive pulmonary disease (HCC) 04/25/2016   Asymptomatic cholelithiasis 09/25/2015   Chronic iron deficiency anemia 12/26/2014   Aortic atherosclerosis (HCC) 10/19/2014   Right nephrolithiasis, asymptomatic (incidental finding) 09/06/2014   Headache, chronic intermittent 01/14/2013   Chronic low back pain 10/06/2012   Long term current use of anticoagulant therapy 09/30/2012   Recurrent candidal intertrigo 07/28/2012   Tobacco use disorder 07/28/2012   History of colonic polyps 05/14/2011   Sleep disturbance 05/14/2011   Abdominal wall hernia 05/14/2011   Constipation due to  opioid therapy 02/03/2011   Health care maintenance 11/04/2010   PAF (paroxysmal atrial fibrillation) (HCC) 10/22/2010   Chronic diastolic heart failure (HCC) 10/21/2010   Fibromyalgia 08/29/2010   Gastroesophageal reflux disease    Type 2 diabetes mellitus with other specified complication (HCC)    Osteoporosis with current pathological  fracture with routine healing    Peripheral arterial disease (HCC) 05/27/2010   History of mitral valve replacement with bioprosthetic valve 2012   Essential hypertension 06/19/2008   Hyperlipidemia 11/20/2005   Severe major depression (HCC) 11/19/2005    Current Outpatient Medications:    furosemide (LASIX) 40 MG tablet, Take 1 tablet (40 mg total) by mouth daily., Disp: 90 tablet, Rfl: 3   potassium chloride (KLOR-CON M) 10 MEQ tablet, Take 1 tablet (10 mEq total) by mouth daily., Disp: 30 tablet, Rfl: 11   venlafaxine XR (EFFEXOR-XR) 75 MG 24 hr capsule, Take 1 capsule (75 mg total) by mouth daily with breakfast., Disp: 30 capsule, Rfl: 11   albuterol (VENTOLIN HFA) 108 (90 Base) MCG/ACT inhaler, Inhale 2 puffs into the lungs every 6 (six) hours as needed for shortness of breath., Disp: 20.1 g, Rfl: 11   ALPRAZolam (XANAX) 0.5 MG tablet, Take 1 tablet (0.5 mg total) by mouth at bedtime. To help with sleep., Disp: 30 tablet, Rfl: 0   buPROPion (WELLBUTRIN XL) 150 MG 24 hr tablet, Take 1 tablet (150 mg total) by mouth every morning., Disp: 90 tablet, Rfl: 3   Continuous Glucose Sensor (FREESTYLE LIBRE 3 SENSOR) MISC, Place 1 sensor on the skin every 14 days. Use to check glucose 6 times daily as directed, Disp: 6 each, Rfl: 3   Fluticasone-Umeclidin-Vilant (TRELEGY ELLIPTA) 100-62.5-25 MCG/ACT AEPB, Inhale 1 puff into the lungs daily., Disp: 180 each, Rfl: 4   gabapentin (NEURONTIN) 600 MG tablet, Take 1 tablet (600 mg total) by mouth at bedtime., Disp: 90 tablet, Rfl: 3   HYDROcodone-acetaminophen (NORCO/VICODIN) 5-325 MG tablet, Take 2 tablets by mouth 2 (two) times daily., Disp: 120 tablet, Rfl: 0   ipratropium (ATROVENT) 0.03 % nasal spray, Instill 2 sprays into the nose 3 (three) times daily as directed, Disp: 30 mL, Rfl: 2   ipratropium-albuterol (DUONEB) 0.5-2.5 (3) MG/3ML SOLN, Inhale 3 mLs by nebulization every 6 (six) hours as needed (shortness of breath, wheezing)., Disp: 90 mL, Rfl:  3   ketoconazole (NIZORAL) 2 % cream, Apply 1 application topically at bedtime as needed for irritation., Disp: 60 g, Rfl: 5   lidocaine-prilocaine (EMLA) cream, Apply 1 Application topically as needed., Disp: 30 g, Rfl: 0   linaclotide (LINZESS) 72 MCG capsule, Take 1 capsule (72 mcg total) by mouth daily before breakfast., Disp: 90 capsule, Rfl: 3   metoprolol succinate (TOPROL XL) 25 MG 24 hr tablet, Take 0.5 tablets (12.5 mg total) by mouth daily., Disp: 45 tablet, Rfl: 3   omeprazole (PRILOSEC) 40 MG capsule, Take 1 capsule (40 mg total) by mouth 2 (two) times daily., Disp: 60 capsule, Rfl: 5   ondansetron (ZOFRAN) 4 MG tablet, Take 1 tablet (4 mg total) by mouth daily as needed for nausea or vomiting., Disp: 30 tablet, Rfl: 1   OXYGEN, Inhale 2 L into the lungs at bedtime., Disp: , Rfl:    ramelteon (ROZEREM) 8 MG tablet, Take 1 tablet (8 mg total) by mouth at bedtime., Disp: 30 tablet, Rfl: 3   rosuvastatin (CRESTOR) 20 MG tablet, Take 1 tablet (20 mg total) by mouth at bedtime., Disp: 90 tablet, Rfl: 3  warfarin (COUMADIN) 2.5 MG tablet, Take 1 tablet (2.5 mg total) by mouth daily unless otherwise instructed, Disp: 90 tablet, Rfl: 3  Functional Status: Living alone, still working on getting needed repairs and updates to her home through a community program.  Independent in IADLs though no longer drives.  Uses assistive device in the home and is very minimally active due to pain. Wears incontinence garment; difficult to move quickly to the toilet.    Objective BP (!) 135/42 (BP Location: Left Arm, Patient Position: Sitting, Cuff Size: Small)   Pulse (!) 56   Temp (!) 97.5 F (36.4 C) (Oral)   Ht 5\' 2"  (1.575 m)   Wt 135 lb 11.2 oz (61.6 kg)   SpO2 94%   BMI 24.82 kg/m   Exam: Awake and alert but tired appearing.  Mood is stable today and affect is positive.  Her weight has obviously decreased.  Occasional wet sounding cough, non productive.  Lungs with improved breath sounds  bilaterall, few scattered rhonchi, few bibasiler rales.  No JVD.  Heart irreg irreg; systolic murmur at base, loudest over LUSB.  Bilateral carotid bruits.  Large seb keratoses noted on anterior trunk, reassured.  Legs no edema.  Feet pink pulseless.  Significantly decreased skin turgor over body.  Diffuse tenderness to palpation over multiple symmetric areas of back from neck to upper buttocks (tender paraspinals more than spinal areas; across trapeziae and lats), arms, legs.  No joint pain, tenderness, or inflammation on exam.  Stooped posture, moves very slowly a few steps in the exam room, holding on to surfaces for balance.  Subtle head tremor.    Assessment and Plan: Type 2 diabetes mellitus with other specified complication, with long-term current use of insulin [facility]) Assessment & Plan: [name] had notified us of morning hypoglycemic episodes for which she stopped her insulin degludec at least a week ago (she has also stopped her GLP-1a (per our phone discussion) due to weight loss).  She has had no further hypoglycemic episodes and blood sugars have remained under fairly good control without the medication.  A1c today is 6.1, down from 7.7.  It is quite possible that her progressive weight loss has greatly improved her insulin resistance. Repeat A1c 4M. Orders: -     POCT glycosylated hemoglobin (Hb A1C)  Chronic iron deficiency anemia Assessment & Plan: Oral supplementation caused significant constipation requiring manual self-disimpaction, despite Linzess.  Stopped Fe after a short period.  Bowels moving again.  Eating excessive ice and appears pale, though periphery is warm. Check cbc and Fe studies today and forward to her hematologist. Orders: -     CBC -     Iron, TIBC and Ferritin Panel  Chronic congestive heart failure with left ventricular diastolic dysfunction (HCC) -     Furosemide; Take 1 tablet (40 mg total) by mouth daily.  Dispense: 90 tablet; Refill: 3 -     Metoprolol  Succinate ER; Take 0.5 tablets (12.5 mg total) by mouth daily.  Dispense: 45 tablet; Refill: 3  Diuretic-induced hypokalemia -     Potassium Chloride Crys ER; Take 1 tablet (10 mEq total) by mouth daily.  Dispense: 30 tablet; Refill: 11  Dehydration, mild -     BMP8+Anion Gap  Essential hypertension Assessment & Plan: 135/42 this morning after not taking medications.  Volume depleted on exam.  Decreasing lasix today and reducing BB dose.  Generalized pain Assessment & Plan: Multiple areas of tenderness on exam, see findings in note.  Fibromyalgia  is uncontrolled.  Changing duloxetine to venlafaxine to determine if a different agent might provide her benefit.  On bid opioid.  She is experiencing pain in the midday, and would desire a midday dose.  I would be willing to prescribe that if we could discontinue the alprazolam, which she would rather keep.  Injection of Toradol today.  Orders: -     Venlafaxine HCl ER; Take 1 capsule (75 mg total) by mouth daily with breakfast.  Dispense: 30 capsule; Refill: 11 -     Ketorolac Tromethamine  Severe major depression (HCC) Assessment & Plan: PHQ9 today 14.  Negative impact on quality of life due to her chronic pain, chronic conditions, and social/family situations are prominent drivers.  Currently on bupropion 150 mg daily and duloxetine 60 mg twice daily.  I would first like to change the duloxetine to a different SNRI (venlafaxine) and titrate as tolerated, with consideration of increasing bupropion in the future as well. Orders: -     Venlafaxine HCl ER; Take 1 capsule (75 mg total) by mouth daily with breakfast.  Dispense: 30 capsule; Refill: 11  Weight loss Assessment & Plan: Weight around 160 pounds in 10/2022, gradually declining to 135 today.  Ozempic has been stopped as no further weight loss is desired.  Further weight loss would need to be investigated as unintended. She is overdue on lung cancer and breast cancer screening.  Recurrent  falls Assessment & Plan: Falls have unfortunately continued.  No fractures, no ED visits, though she does have bilateral buttock pain from a fall onto her bottom a couple of weeks ago.  Persistent left hip/leg pain following hip fracture repair alters her gait (she uses an assistive device).  Chronically disrupted sleep-wake cycle causes daytime sleepiness, compounded by opioid medication.  She receives PT in the home.  Pulmonary hypertension due to chronic obstructive pulmonary disease (HCC) Assessment & Plan: Murmur notable over the left upper sternal border.  No clinical manifestations at this time (no JVD, no LE edema).  She is volume depleted on her daily 80 mg Lasix.  Will reduce dose to 40 mg daily, with compensatory decrease in potassium supplement.  Personal history of renal cell carcinoma Assessment & Plan: CT abdomen and pelvis without contrast completed in 10/2022 for unrelated reasons.  "Stable old cryoablation defect involving the lower pole of the right kidney. No evidence of suspicious renal masses or hydronephrosis".    Age-related osteoporosis with current pathological fracture with routine healing, subsequent encounter Assessment & Plan: Chart reviewed after today's visit-vitamin D deficient at time of hip fracture admission 10/2022, was to have started on vitamin D supplementation and be rechecked at f/u visit.  Intention at hospital discharge was also for her to start bisphosphonate.  I will need to follow-up on this, as treatment is important given her high fall risk.  History of not tolerating bisphosphonates in the remote past per chart review.  History of stroke without residual deficits Assessment & Plan: BP and DM2 under good control.  Not on ASA due to excess risk in combination with her coumadin.Heavy smoking w/o desire to quit. She will have carotid ultrasounds at upcoming routine vascular f/u appt (presence of bilateral carotid bruits).  History of atrial  fibrillation Assessment & Plan: Currently heart auscultates irreg irreg. Rate controlled on metoprolol (decrease dose today from 25 to 12.5 mg daily given episodes of bradycardia on pulse check today (down to 50s) and in setting of volume depletion. Anticoagulated. Asymptomatic.   Chronic diastolic  heart failure (HCC) Assessment & Plan: Volume depleted on exam; will decrease lasix 80 daily to 40 mg daily.   Gait instability Assessment & Plan: Requests a new rollator.  PT is coming, she continues to fall which is likely multifactorial (meds, pain, stiffness, deconditioning, frailty, sleepiness, diminished safety awareness).  She has been referred from prior hospitalization to neurology for evaluation of gait instability; appt is upcoming.    Essential tremor Assessment & Plan: Mild head/oral tremor, noted periodically when exhaling.  Not bothersome to add a medication with potential side effects.   Other orders -     Glucose, capillary     Return in about 4 weeks (around 04/09/2023) for chronic condition monitoring, symptom re-check, functional reassessment.

## 2023-03-12 NOTE — Assessment & Plan Note (Signed)
Mild head/oral tremor, noted periodically when exhaling.  Not bothersome to add a medication with potential side effects.

## 2023-03-12 NOTE — Assessment & Plan Note (Signed)
BP and DM2 under good control.  Not on ASA due to excess risk in combination with her coumadin.Heavy smoking w/o desire to quit. She will have carotid ultrasounds at upcoming routine vascular f/u appt (presence of bilateral carotid bruits).

## 2023-03-12 NOTE — Assessment & Plan Note (Signed)
Multiple areas of tenderness on exam, see findings in note.  Fibromyalgia is uncontrolled.  Changing duloxetine to venlafaxine to determine if a different agent might provide her benefit.  On bid opioid.  She is experiencing pain in the midday, and would desire a midday dose.  I would be willing to prescribe that if we could discontinue the alprazolam, which she would rather keep.  Injection of Toradol today.

## 2023-03-12 NOTE — Assessment & Plan Note (Signed)
Volume depleted on exam; will decrease lasix 80 daily to 40 mg daily.

## 2023-03-12 NOTE — Assessment & Plan Note (Addendum)
135/42 this morning after not taking medications.  Volume depleted on exam.  Decreasing lasix today and reducing BB dose.

## 2023-03-12 NOTE — Assessment & Plan Note (Signed)
Currently heart auscultates irreg irreg. Rate controlled on metoprolol (decrease dose today from 25 to 12.5 mg daily given episodes of bradycardia on pulse check today (down to 50s) and in setting of volume depletion. Anticoagulated. Asymptomatic.

## 2023-03-12 NOTE — Assessment & Plan Note (Signed)
Oral supplementation caused significant constipation requiring manual self-disimpaction, despite Linzess.  Stopped Fe after a short period.  Bowels moving again.  Eating excessive ice and appears pale, though periphery is warm. Check cbc and Fe studies today and forward to her hematologist.

## 2023-03-12 NOTE — Assessment & Plan Note (Addendum)
Weight around 160 pounds in 10/2022, gradually declining to 135 today.  Ozempic has been stopped as no further weight loss is desired.  Further weight loss would need to be investigated as unintended. She is overdue on lung cancer and breast cancer screening.

## 2023-03-12 NOTE — Assessment & Plan Note (Signed)
Falls have unfortunately continued.  No fractures, no ED visits, though she does have bilateral buttock pain from a fall onto her bottom a couple of weeks ago.  Persistent left hip/leg pain following hip fracture repair alters her gait (she uses an assistive device).  Chronically disrupted sleep-wake cycle causes daytime sleepiness, compounded by opioid medication.  She receives PT in the home.

## 2023-03-12 NOTE — Assessment & Plan Note (Addendum)
Chart reviewed after today's visit-vitamin D deficient at time of hip fracture admission 10/2022, was to have started on vitamin D supplementation and be rechecked at f/u visit.  Intention at hospital discharge was also for her to start bisphosphonate.  I will need to follow-up on this, as treatment is important given her high fall risk.  History of not tolerating bisphosphonates in the remote past per chart review.

## 2023-03-12 NOTE — Assessment & Plan Note (Addendum)
Ms. Wise had notified us of morning hypoglycemic episodes for which she stopped her insulin degludec at least a week ago (she has also stopped her GLP-1a (per our phone discussion) due to weight loss).  She has had no further hypoglycemic episodes and blood sugars have remained under fairly good control without the medication.  A1c today is 6.1, down from 7.7.  It is quite possible that her progressive weight loss has greatly improved her insulin resistance. Repeat A1c 68M.

## 2023-03-13 ENCOUNTER — Encounter: Payer: Self-pay | Admitting: Internal Medicine

## 2023-03-13 ENCOUNTER — Other Ambulatory Visit (HOSPITAL_COMMUNITY): Payer: Self-pay

## 2023-03-13 ENCOUNTER — Ambulatory Visit: Payer: PPO | Admitting: Podiatry

## 2023-03-13 DIAGNOSIS — E1151 Type 2 diabetes mellitus with diabetic peripheral angiopathy without gangrene: Secondary | ICD-10-CM

## 2023-03-13 DIAGNOSIS — B351 Tinea unguium: Secondary | ICD-10-CM

## 2023-03-13 DIAGNOSIS — L84 Corns and callosities: Secondary | ICD-10-CM | POA: Diagnosis not present

## 2023-03-13 LAB — BMP8+ANION GAP
Anion Gap: 15 mmol/L (ref 10.0–18.0)
BUN/Creatinine Ratio: 14 (ref 12–28)
BUN: 14 mg/dL (ref 8–27)
CO2: 25 mmol/L (ref 20–29)
Calcium: 9.7 mg/dL (ref 8.7–10.3)
Chloride: 101 mmol/L (ref 96–106)
Creatinine, Ser: 0.98 mg/dL (ref 0.57–1.00)
Glucose: 193 mg/dL — ABNORMAL HIGH (ref 70–99)
Potassium: 4.9 mmol/L (ref 3.5–5.2)
Sodium: 141 mmol/L (ref 134–144)
eGFR: 61 mL/min/{1.73_m2} (ref >59)

## 2023-03-13 LAB — CBC
Hematocrit: 37.4 % (ref 34.0–46.6)
Hemoglobin: 10.6 g/dL — ABNORMAL LOW (ref 11.1–15.9)
MCH: 22.4 pg — ABNORMAL LOW (ref 26.6–33.0)
MCHC: 28.3 g/dL — ABNORMAL LOW (ref 31.5–35.7)
MCV: 79 fL (ref 79–97)
Platelets: 226 10*3/uL (ref 150–450)
RBC: 4.74 x10E6/uL (ref 3.77–5.28)
RDW: 17.5 % — ABNORMAL HIGH (ref 11.7–15.4)
WBC: 8.3 10*3/uL (ref 3.4–10.8)

## 2023-03-13 LAB — IRON,TIBC AND FERRITIN PANEL
Ferritin: 12 ng/mL — ABNORMAL LOW (ref 15–150)
Iron Saturation: 5 % — CL (ref 15–55)
Iron: 21 ug/dL — ABNORMAL LOW (ref 27–139)
Total Iron Binding Capacity: 398 ug/dL (ref 250–450)
UIBC: 377 ug/dL — ABNORMAL HIGH (ref 118–369)

## 2023-03-13 MED ORDER — ALBUTEROL SULFATE HFA 108 (90 BASE) MCG/ACT IN AERS
INHALATION_SPRAY | RESPIRATORY_TRACT | 10 refills | Status: DC
  Filled 2023-03-13: qty 54, 75d supply, fill #0
  Filled 2023-03-18: qty 54, 60d supply, fill #0
  Filled 2023-05-26: qty 54, 60d supply, fill #1
  Filled 2023-07-18 (×2): qty 54, 60d supply, fill #2
  Filled 2023-09-19: qty 54, 60d supply, fill #3
  Filled 2023-11-25: qty 54, 60d supply, fill #4

## 2023-03-13 NOTE — Progress Notes (Signed)
[name], as we anticipated, your iron levels are very low again.  I will send this message to Dr. Candise Che who can advise you on next steps (probably IV iron since you didn't tolerate the pills).  The hemoglobin hasn't changed, fortunately, but you'll need iron to maintain.  Kidney function and electrolytes look good.  Great to see you yesterday.  I'll be out of town today and back on Monday if you need to contact me.

## 2023-03-13 NOTE — Progress Notes (Unsigned)
Subjective:  Patient ID: [name], female    DOB: 06-21-1950,  MRN: [id number]  [name] presents to clinic today for:  Chief Complaint  Patient presents with   [facility]    Last A1c: 6.1. Take Warfarin.   . Patient notes nails are thick and elongated, causing pain in shoe gear when ambulating.  She also has painful calluses on the ball of her right foot.  She has pain on the medial aspect of the right great toe.  She does take gabapentin for neuropathy  PCP is Miguel Aschoff, MD. date last seen was 08/21/2022  Allergies  Allergen Reactions   Dexilant [Dexlansoprazole] Other (See Comments)    Made acid reflux worse   Lorazepam Other (See Comments)    Patient's sister noted that ativan caused the patient to become extremely confused during hospitalization 09/2010; tolerates Xanax   Oxycontin [Oxycodone] Other (See Comments)    headache   Tramadol Hcl Swelling    Ankle swelling    Review of Systems: Negative except as noted in the HPI.  Objective:  There were no vitals filed for this visit.  [name] is a pleasant 73 y.o. female in NAD. AAO x 3.  Vascular Examination: Patient has palpable DP pulse, absent PT pulse bilateral.  Delayed capillary refill bilateral toes.  Sparse digital hair bilateral.  Proximal to distal cooling WNL bilateral.    Dermatological Examination: Interspaces are clear with no open lesions noted bilateral.  Nails are 3-101mm thick, with yellowish/brown discoloration, subungual debris and distal onycholysis x10.  There is pain with compression of nails x10.  There are hyperkeratotic lesions noted submet 1 right foot.  No significant callus subfirst metatarsal head left foot today.  Some mild callus formation right hallux medial nail border  Neurological Examination: Protective sensation intact b/l LE. Vibratory sensation diminished b/l LE.  Light touch sensation diminished bilateral  Musculoskeletal  Examination: Muscle strength 5/5 to all LE muscle groups b/l.      Latest Ref Rng & Units 03/12/2023    9:05 AM 12/02/2022   10:22 AM 07/24/2022   11:28 AM 05/01/2022   10:26 AM  Hemoglobin A1C  Hemoglobin-A1c 4.0 - 5.6 % 6.1  7.7  7.4  7.5    Patient qualifies for at-risk foot care because of diabetes with PVD.  Assessment/Plan: 1. Dermatophytosis of nail   2. Callus of foot   3. Type II diabetes mellitus with peripheral circulatory disorder (HCC)     No orders of the defined types were placed in this encounter.  Mycotic nails x10 were sharply debrided with sterile nail nippers and power debriding burr to decrease bulk and length.  Hyperkeratotic lesions x 1 right foot subfirst metatarsal head were shaved with #312 blade.  Hyperkeratotic tissue right hallux medial nail border was pared down today as well to patient tolerance.  Continue daily foot checks and tight glucose control.  Continue with gabapentin daily as prescribed by her PCP  She is already scheduled for her next [facility] appointment in 3 months.   Bronwen Betters, DPM, AACFAS [facility]     [address], [place] [address]  Office [phone]  Fax [phone]    Right sub 1 callus

## 2023-03-16 DIAGNOSIS — Z7901 Long term (current) use of anticoagulants: Secondary | ICD-10-CM | POA: Diagnosis not present

## 2023-03-16 DIAGNOSIS — I480 Paroxysmal atrial fibrillation: Secondary | ICD-10-CM | POA: Diagnosis not present

## 2023-03-16 NOTE — Telephone Encounter (Signed)
Pt provides result of PST FS POC INR = 1.5 (target range 2.0 - 2.5) on 10 mg warfarin TOTAL per the last week. No missed doses, no new medications. Adquate supply on-hand. INCREASE to 11.25 mg warfarin/wk as: 1x2.5 mg on M/Su; 1/2 x 2.5 mg (1.25 mg dose) ALL OTHER DAYS for total of 11.25 mg warfarin for the coming week. REPEAT PST FS POC INR determination on Monday 10-FEB-25.

## 2023-03-17 ENCOUNTER — Other Ambulatory Visit: Payer: Self-pay | Admitting: Interventional Radiology

## 2023-03-17 DIAGNOSIS — C642 Malignant neoplasm of left kidney, except renal pelvis: Secondary | ICD-10-CM

## 2023-03-18 ENCOUNTER — Other Ambulatory Visit (HOSPITAL_COMMUNITY): Payer: Self-pay

## 2023-03-18 ENCOUNTER — Other Ambulatory Visit: Payer: Self-pay

## 2023-03-19 ENCOUNTER — Other Ambulatory Visit: Payer: Self-pay

## 2023-03-19 ENCOUNTER — Telehealth: Payer: Self-pay | Admitting: *Deleted

## 2023-03-19 ENCOUNTER — Other Ambulatory Visit (HOSPITAL_COMMUNITY): Payer: Self-pay

## 2023-03-19 ENCOUNTER — Other Ambulatory Visit: Payer: Self-pay | Admitting: Internal Medicine

## 2023-03-19 DIAGNOSIS — E7841 Elevated Lipoprotein(a): Secondary | ICD-10-CM

## 2023-03-19 DIAGNOSIS — J439 Emphysema, unspecified: Secondary | ICD-10-CM | POA: Diagnosis not present

## 2023-03-19 DIAGNOSIS — I509 Heart failure, unspecified: Secondary | ICD-10-CM | POA: Diagnosis not present

## 2023-03-19 DIAGNOSIS — I270 Primary pulmonary hypertension: Secondary | ICD-10-CM | POA: Diagnosis not present

## 2023-03-19 DIAGNOSIS — J449 Chronic obstructive pulmonary disease, unspecified: Secondary | ICD-10-CM | POA: Diagnosis not present

## 2023-03-19 MED ORDER — ROSUVASTATIN CALCIUM 20 MG PO TABS
20.0000 mg | ORAL_TABLET | Freq: Every day | ORAL | 3 refills | Status: DC
  Filled 2023-03-19: qty 90, 90d supply, fill #0

## 2023-03-19 NOTE — Telephone Encounter (Addendum)
 Call from [place], [facility] requesting VO for PT 1 time a week for 2 weeks to work on preparing patient for discharge in 2 weeks. Verbal approval was given.  Will forward to Dr. Trudy for approval or denial of the plan.  Patient also is c/o constipation has not had a stool in a couple of days.

## 2023-03-19 NOTE — Telephone Encounter (Signed)
 Medication sent to pharmacy

## 2023-03-20 ENCOUNTER — Other Ambulatory Visit (HOSPITAL_COMMUNITY): Payer: Self-pay

## 2023-03-20 ENCOUNTER — Other Ambulatory Visit: Payer: Self-pay | Admitting: Student

## 2023-03-20 ENCOUNTER — Other Ambulatory Visit: Payer: Self-pay

## 2023-03-20 NOTE — Progress Notes (Signed)
 Received call on after hours line from pt. She states that she has been eating a lot today (last meal was 10 minutes before phone call), and states that her sugar is 332. Pt was recently seen in the clinic and she states that her insulin  was discontinued then because of improved glycemic control (A1c 6.1 from 7.7). She currently does not have any short term insulin  with her. There was some worry about hypoglycemic episodes as well, which is another reason why insulin  was discontinued. She does not have any correction with her.   At this time, she is asymptomatic, and given very recent ingestion of food, and spike in her blood sugar I recommended she recheck in about an hour or so. Per chart review it seems that she is very sensitive to insulin  given her recent hypoglycemic episodes, so will hold on telling her to take any long acting. Suspect this is from uncontrolled diet (which is an anomaly for her, as usually she's disciplined in her regimen).

## 2023-03-22 ENCOUNTER — Other Ambulatory Visit: Payer: Self-pay

## 2023-03-22 MED ORDER — TRELEGY ELLIPTA 100-62.5-25 MCG/ACT IN AEPB
1.0000 | INHALATION_SPRAY | Freq: Every day | RESPIRATORY_TRACT | 3 refills | Status: AC
  Filled 2023-03-22 – 2023-03-23 (×2): qty 180, 90d supply, fill #0
  Filled 2023-05-28 (×2): qty 180, 90d supply, fill #1
  Filled 2023-09-19: qty 180, 90d supply, fill #2
  Filled 2023-12-16: qty 180, 90d supply, fill #3

## 2023-03-23 ENCOUNTER — Other Ambulatory Visit (HOSPITAL_BASED_OUTPATIENT_CLINIC_OR_DEPARTMENT_OTHER): Payer: Self-pay

## 2023-03-23 ENCOUNTER — Other Ambulatory Visit (HOSPITAL_COMMUNITY): Payer: Self-pay

## 2023-03-23 ENCOUNTER — Other Ambulatory Visit: Payer: Self-pay

## 2023-03-23 NOTE — Telephone Encounter (Signed)
Pt provided PST FS POC INR result of 1.7 (Target range 2.0 - 2.5) on 2.5 mg Mo/Su; 1/2 x 2.5 mg [1.25 mg] all other days for 11.25 mg warfarin/wk. INCREASE to 12.5 mg warfarin/wk as 1x2.5 mg M/Tu/Su; 1/2 x 2.5 mg [1.25 mg dose] all other days. No bleeding, no missed doses, has adequate supply of warfarin she states. Repeat INR next Monday [id number].

## 2023-03-26 ENCOUNTER — Other Ambulatory Visit (HOSPITAL_COMMUNITY): Payer: Self-pay

## 2023-03-30 ENCOUNTER — Other Ambulatory Visit: Payer: Self-pay

## 2023-03-30 ENCOUNTER — Telehealth: Payer: Self-pay | Admitting: Pharmacist

## 2023-03-30 DIAGNOSIS — G8929 Other chronic pain: Secondary | ICD-10-CM

## 2023-03-30 NOTE — Telephone Encounter (Signed)
Pt. provides results of PST FS POC INR = 1.8 (Target goal 2.0 - 2.5) on 12.5 mg warfarin/wk. INCREASE to 15 mg total weekly dosage of warfarin/wk as: 1x2.5 mg M/Tu/W/Th & Su; 1/2 x 2.5 mg (1.25 mg dose) on Fri/Sat. Repeat INR 24-FEB-25.

## 2023-03-31 ENCOUNTER — Other Ambulatory Visit: Payer: Self-pay

## 2023-03-31 ENCOUNTER — Ambulatory Visit: Payer: PPO | Admitting: Neurology

## 2023-03-31 ENCOUNTER — Other Ambulatory Visit: Payer: Self-pay | Admitting: Dietician

## 2023-03-31 ENCOUNTER — Other Ambulatory Visit (HOSPITAL_COMMUNITY): Payer: Self-pay

## 2023-03-31 ENCOUNTER — Encounter: Payer: Self-pay | Admitting: Neurology

## 2023-03-31 VITALS — BP 156/45 | HR 65 | Ht 62.0 in | Wt 125.5 lb

## 2023-03-31 DIAGNOSIS — E1169 Type 2 diabetes mellitus with other specified complication: Secondary | ICD-10-CM

## 2023-03-31 DIAGNOSIS — R419 Unspecified symptoms and signs involving cognitive functions and awareness: Secondary | ICD-10-CM | POA: Diagnosis not present

## 2023-03-31 DIAGNOSIS — F5104 Psychophysiologic insomnia: Secondary | ICD-10-CM

## 2023-03-31 MED ORDER — ONETOUCH VERIO FLEX SYSTEM W/DEVICE KIT
PACK | 1 refills | Status: DC
  Filled 2023-03-31: qty 1, 30d supply, fill #0

## 2023-03-31 MED ORDER — ACCU-CHEK GUIDE TEST VI STRP
ORAL_STRIP | 3 refills | Status: DC
  Filled 2023-03-31: qty 100, 100d supply, fill #0

## 2023-03-31 MED ORDER — ONETOUCH DELICA PLUS LANCET33G MISC
3 refills | Status: DC
  Filled 2023-03-31: qty 100, 100d supply, fill #0

## 2023-03-31 NOTE — Telephone Encounter (Signed)
Patient called back to find out what she should do about her high blood sugars, should get meter tomorrow, she agreed to call the office after taking her blood sugar a few times with it to let us know if it confirms her high blood sugars.

## 2023-03-31 NOTE — Progress Notes (Unsigned)
[facility]  PATIENT: [name] DOB: [birth year]  REQUESTING CLINICIAN: Glyn Ade, MD HISTORY FROM: Patient and sister  REASON FOR VISIT: Cognitive impairment    HISTORICAL  CHIEF COMPLAINT:  Chief Complaint  Patient presents with   Room 12    Pt is here with her Sister. Pt sates that within the last 2 days her memory has been fuzzy. Pt states that she would forget that she had taken her medication. Pt states she has had a couple of injuries. Pt states that she would like to discuss her Fibromyalgia.      HISTORY OF PRESENT ILLNESS:  This is 73 year old woman past medical history of hypertension, hyperlipidemia, diabetes mellitus, chronic pain, history of lung and heart disease who is presenting with cognitive impairment.  Patient was referred back in October when she was seen in the hospital for confusion and altered mental status.  Sister tells me at that time she was very confused due to overmedication from her chronic pain.  She was taking combination of Xanax, Neurontin, Norco, sleep aid which made her very confused.  Since then she has followed-up with her PCP who had made some changes in her medications, decrease some of them and discontinue others.  Since then, her cognition has improved.  Patient tells me that she feels her memory is fine, she lives alone, independent all actives of daily living, she is limited by pain, otherwise she tells me she does not need any help.   TBI:   No past history of TBI Stroke: previous basal ganglia strokes Seizures:  no past history of seizures Sleep: Yes, uses a CPAP Mood: Yes,  patient denies anxiety and depression Family history of Dementia:  Denies  Functional status: independent in all ADLs and IADLs Patient lives with alone. Cooking: no, microwave Cleaning: no Shopping: no issues  Bathing: no issues  Toileting: no issues  Driving: no issues  Bills: self, no issues  Medications: Xanax,  Gabapentin, Norco, Ramelteon Ever left the stove on by accident?: /a Forget how to use items around the house?: denies  Getting lost going to familiar places?: denies  Forgetting loved ones names?: denies  Word finding difficulty? Yes  Sleep: ok with sleeping aid    OTHER MEDICAL CONDITIONS: Chronic pain, insomnia, lung disease, heart disease, diabetes, Hypertension, hyperlipidemia    REVIEW OF SYSTEMS: Full 14 system review of systems performed and negative with exception of: As noted in the HPI   ALLERGIES: Allergies  Allergen Reactions   Dexilant [Dexlansoprazole] Other (See Comments)    Made acid reflux worse   Lorazepam Other (See Comments)    Patient's sister noted that ativan caused the patient to become extremely confused during hospitalization 09/2010; tolerates Xanax   Oxycontin [Oxycodone] Other (See Comments)    headache   Tramadol Hcl Swelling    Ankle swelling    HOME MEDICATIONS: Outpatient Medications Prior to Visit  Medication Sig Dispense Refill   albuterol (VENTOLIN HFA) 108 (90 Base) MCG/ACT inhaler Inhale 2 puffs into the lungs every 6 hours as needed for shortness of breath 54 g 10   ALPRAZolam (XANAX) 0.5 MG tablet Take 1 tablet (0.5 mg total) by mouth at bedtime. To help with sleep. 30 tablet 0   buPROPion (WELLBUTRIN XL) 150 MG 24 hr tablet Take 1 tablet (150 mg total) by mouth every morning. 90 tablet 3   Continuous Glucose Sensor (FREESTYLE LIBRE 3 SENSOR) MISC Place 1 sensor on the skin every 14 days. Use  to check glucose 6 times daily as directed 6 each 3   Fluticasone-Umeclidin-Vilant (TRELEGY ELLIPTA) 100-62.5-25 MCG/ACT AEPB Inhale 1 puff into the lungs daily. 180 each 3   furosemide (LASIX) 40 MG tablet Take 1 tablet (40 mg total) by mouth daily. (Patient taking differently: Take 10 mg by mouth daily.) 90 tablet 3   gabapentin (NEURONTIN) 600 MG tablet Take 1 tablet (600 mg total) by mouth at bedtime. 90 tablet 3   HYDROcodone-acetaminophen  (NORCO/VICODIN) 5-325 MG tablet Take 2 tablets by mouth 2 (two) times daily. 120 tablet 0   ipratropium (ATROVENT) 0.03 % nasal spray Instill 2 sprays into the nose 3 (three) times daily as directed 30 mL 2   ipratropium-albuterol (DUONEB) 0.5-2.5 (3) MG/3ML SOLN Inhale 3 mLs by nebulization every 6 (six) hours as needed (shortness of breath, wheezing). 90 mL 3   ketoconazole (NIZORAL) 2 % cream Apply 1 application topically at bedtime as needed for irritation. 60 g 5   lidocaine-prilocaine (EMLA) cream Apply 1 Application topically as needed. 30 g 0   linaclotide (LINZESS) 72 MCG capsule Take 1 capsule (72 mcg total) by mouth daily before breakfast. 90 capsule 3   metoprolol succinate (TOPROL XL) 25 MG 24 hr tablet Take 0.5 tablets (12.5 mg total) by mouth daily. 45 tablet 3   omeprazole (PRILOSEC) 40 MG capsule Take 1 capsule (40 mg total) by mouth 2 (two) times daily. 60 capsule 5   ondansetron (ZOFRAN) 4 MG tablet Take 1 tablet (4 mg total) by mouth daily as needed for nausea or vomiting. 30 tablet 1   potassium chloride (KLOR-CON M) 10 MEQ tablet Take 1 tablet (10 mEq total) by mouth daily. 30 tablet 11   ramelteon (ROZEREM) 8 MG tablet Take 1 tablet (8 mg total) by mouth at bedtime. 30 tablet 3   rosuvastatin (CRESTOR) 20 MG tablet Take 1 tablet (20 mg total) by mouth at bedtime. 90 tablet 3   venlafaxine XR (EFFEXOR-XR) 75 MG 24 hr capsule Take 1 capsule (75 mg total) by mouth daily with breakfast. 30 capsule 11   warfarin (COUMADIN) 2.5 MG tablet Take 1 tablet (2.5 mg total) by mouth daily unless otherwise instructed 90 tablet 3   Blood Glucose Monitoring Suppl (ONETOUCH VERIO FLEX SYSTEM) w/Device KIT Use to check blood sugar up to 7 times a week (Patient not taking: Reported on 03/31/2023) 1 kit 1   glucose blood (ACCU-CHEK GUIDE TEST) test strip Use to check blood sugar up to 7 times a week (Patient not taking: Reported on 03/31/2023) 100 each 3   Lancets (ONETOUCH DELICA PLUS LANCET33G)  MISC Use to check blood sugar up to 7 times a week (Patient not taking: Reported on 03/31/2023) 100 each 3   OXYGEN Inhale 2 L into the lungs at bedtime. (Patient not taking: Reported on 03/31/2023)     No facility-administered medications prior to visit.    PAST MEDICAL HISTORY: Past Medical History:  Diagnosis Date   Abnormality of lung on CXR 02/02/2020   Nonspecific finding on CXR ordered by pulmonologist - c/w inflammation vs infection, f/u imaging suggested, Dr. Roxy Cedar office has been in communication about recommended next steps.   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   Non-bleeding when visualized on capsule endoscopy 06/30/2015    Arthritis    "lower back; hands" (02/19/2018)   Asthma    Asymptomatic cholelithiasis 09/25/2015  Seen on CT scan 08/2015   Carotid artery stenosis; s/p R endarterectomy    s/p right endarterectomy (06/2010) Carotid US (07/2010):  Left: Moderate-to-severe (60-79%) calcific and non-calcific plaque origin and proximal ICA and ECA    Chronic congestive heart failure with left ventricular diastolic dysfunction (HCC) 10/21/2010   Chronic constipation 02/03/2011   Chronic daily headache 01/16/2014   Chronic iron deficiency anemia    Chronic low back pain 10/06/2012   Chronic venous insufficiency 08/04/2012   Closed fracture of one rib of left side 08/23/2021   ED visit after a fall 08/16/21    COPD (chronic obstructive pulmonary disease) with emphysema (HCC)    PFTs 2018: severe obstructive disease, insignif response to bronchiodilator, mild restriction parenchymal pattern, moderately severe diffusion defect. 2014  FEV1 0.92 (40%), ratio 69, 27% increase in FEV1 with BD, TLC 91%, severe airtrapping, DLCO49% On chronic home O2. Pulmonary rehab referral 05/2012    Dyspnea    Fibromyalgia 08/29/2010   Gastroesophageal reflux  disease    History of blood transfusion    "several times"  (02/19/2018)   History of clear cell renal cell carcinoma (HCC), in remission 07/21/2011   s/p cryoablation of left RCC in 09/2011 by Dr. Fredia Sorrow. Followed by Dr. Retta Diones  [facility]) .     History of fracture of left hip 10/17/2022   History of hiatal hernia    History of mitral valve replacement with bioprosthetic valve due to mitral stenosis 2012   s/p MVR with a 27-mm pericardial porcine valve (Medtronic Mosaic valve, serial #[id number] on 09/20/10, Dr. Donata Clay)    History of obstructive sleep apnea, resolved 2013   resolved per sleep study 07/2019; no apnea, but did have desaturation.  CPAP no longer necessary.  Nocturnal polysomnography (06/2009): Moderate sleep apnea/ hypopnea syndrome , AHI 17.8 per hour with nonpositional hypopneas. CPAP titration to 12 CWP, AHI 2.4 per hour. On nocturnal CPAP via a small resMed Quattro full-face mask with heated humidifier.    History of pneumonia    "once"  (02/19/2018)   History of seborrheic keratosis 09/28/2015   History of stroke without residual deficits 01/17/2022   Hyperlipidemia LDL goal < 100 11/20/2005   Internal and external hemorrhoids without complication 08/04/2012   Lesion of left native kidney 06/01/2020   Incidental finding on recent screening chest CT for lung ca ordered by Dr. Maple Hudson pulmonology - he has ordered a f/u renal U/S.    Lichen sclerosus of female genitalia 01/12/2017   Migraine    "none in years" (02/19/2018)   Mild cognitive impairment with memory loss 12/23/2021   Moderately severe major depression (HCC) 11/19/2005   Nocturnal hypoxia per sleep study 07/2019    Osteoporosis    DEXA 2016: T -2.7; DEXA (12/09/2011): L-spine T -3.7, left hip T -1.4 DEXA (12/2004): L-spine T -2.6, left hip -0.1    Paroxysmal atrial fibrillation (HCC) 10/22/2010   s/p Left atrial maze procedure for paroxysmal atrial fibrillation on 09/20/2010 by Dr Donata Clay.  Subsequent  splenic infarct, decision was made to re-anticoagulate with coumadin, likely life-long as this is the most likely cause of the splenic infarct.    Personal history of colonic polyps 05/14/2011   Colonoscopy (05/2011): 4 mm adenomatous polyp excised endoscopically Colonoscopy (02/2002): Adenomatous polyp excised endoscopically    Personal history of renal cell carcinoma 09/12/2020   Pneumonia    Pulmonary hypertension due to chronic obstructive pulmonary disease (HCC) 04/25/2016   2014 TEE w PA peak pressure 46  mmHg, s/p MV replacement    Right nephrolithiasis, asymptomatic, incidental finding 09/06/2014   5 mm non-obstructing calculus seen on CT scan 09/05/2014    Right ventricular failure (HCC) 04/25/2016   Severe obesity (BMI 35.0-39.9) with comorbidity (HCC) 10/23/2011   Sleep apnea    Stage 2 chronic kidney disease due to type 2 diabetes mellitus (HCC) 12/16/2018   CKD stage III     Tobacco abuse 07/28/2012   Type 2 diabetes mellitus with diabetic neuropathy [facility])     PAST SURGICAL HISTORY: Past Surgical History:  Procedure Laterality Date   BIOPSY  07/19/2021   Procedure: BIOPSY;  Surgeon: Vida Rigger, MD;  Location: [facility] ENDOSCOPY;  Service: Gastroenterology;;   CARDIAC CATHETERIZATION     CARDIAC VALVE REPLACEMENT  Aug. 2012   "mitral valve"   CAROTID ENDARTERECTOMY Right 07/04/2010   by Dr. Myra Gianotti for asymptomatic right carotid artery stenosis   CATARACT EXTRACTION W/ INTRAOCULAR LENS  IMPLANT, BILATERAL Bilateral    CHEST TUBE INSERTION  09/24/2010   Dr Zenaida Niece Trigt   COLONOSCOPY  05/12/2011   performed by Dr. Bosie Clos. Showing small internal hemorrhoids, single tubular adenoma polyp   COLONOSCOPY WITH PROPOFOL N/A 05/29/2020   Procedure: COLONOSCOPY WITH PROPOFOL;  Surgeon: Vida Rigger, MD;  Location: [facility] ENDOSCOPY;  Service: Endoscopy;  Laterality: N/A;   COLONOSCOPY WITH PROPOFOL N/A 07/19/2021   Procedure: COLONOSCOPY WITH PROPOFOL;  Surgeon: Vida Rigger, MD;  Location: [facility];   Service: Gastroenterology;  Laterality: N/A;   CRYOABLATION Left 09/2011   by Dr. Fredia Sorrow. Followed by Dr. Retta Diones  [facility]) .     DILATION AND CURETTAGE OF UTERUS     ESOPHAGOGASTRODUODENOSCOPY  05/12/2011   performed by Dr. Bosie Clos. Negative for ulcerations, biopsy negative for evidence of celiac sprue   ESOPHAGOGASTRODUODENOSCOPY N/A 06/29/2015   Procedure: ESOPHAGOGASTRODUODENOSCOPY (EGD);  Surgeon: Vida Rigger, MD;  Location: [facility] ENDOSCOPY;  Service: Endoscopy;  Laterality: N/A;   ESOPHAGOGASTRODUODENOSCOPY N/A 03/29/2016   Procedure: ESOPHAGOGASTRODUODENOSCOPY (EGD);  Surgeon: Vida Rigger, MD;  Location: [facility] ENDOSCOPY;  Service: Endoscopy;  Laterality: N/A;   ESOPHAGOGASTRODUODENOSCOPY (EGD) WITH PROPOFOL N/A 05/29/2020   Procedure: ESOPHAGOGASTRODUODENOSCOPY (EGD) WITH PROPOFOL;  Surgeon: Vida Rigger, MD;  Location: [facility];  Service: Endoscopy;  Laterality: N/A;   FRACTURE SURGERY     GIVENS CAPSULE STUDY N/A 06/30/2015   Procedure: GIVENS CAPSULE STUDY;  Surgeon: Vida Rigger, MD;  Location: [facility] ENDOSCOPY;  Service: Endoscopy;  Laterality: N/A;   GIVENS CAPSULE STUDY N/A 06/29/2015   Procedure: GIVENS CAPSULE STUDY;  Surgeon: Vida Rigger, MD;  Location: [facility] ENDOSCOPY;  Service: Endoscopy;  Laterality: N/A;   HEMORRHOID SURGERY  1970s?   "lanced"   HYSTEROSCOPY W/ ENDOMETRIAL ABLATION  06/2001   for persistent post-menopausal bleeding // by S. Kyra Manges, M.D.   INTRAMEDULLARY (IM) NAIL INTERTROCHANTERIC Left 10/19/2022   Procedure: LEFT INTRAMEDULLARY (IM) NAIL INTERTROCHANTERIC;  Surgeon: Luci Bank, MD;  Location: [facility] OR;  Service: Orthopedics;  Laterality: Left;   IR GENERIC HISTORICAL  08/23/2015   IR RADIOLOGIST EVAL & MGMT 08/23/2015 Irish Lack, MD [facility] INTERV RAD   IR GENERIC HISTORICAL  04/09/2016   IR RADIOLOGIST EVAL & MGMT 04/09/2016 Irish Lack, MD [facility] INTERV RAD   IR RADIOLOGIST EVAL & MGMT  10/07/2016   IR RADIOLOGIST EVAL & MGMT  06/25/2017   IR  RADIOLOGIST EVAL & MGMT  07/31/2020   IR RADIOLOGIST EVAL & MGMT  10/02/2020   IR RADIOLOGIST EVAL & MGMT  12/20/2020   IR RADIOLOGIST EVAL &  MGMT  12/25/2021   LEFT HEART CATH AND CORONARY ANGIOGRAPHY N/A 04/21/2016   Procedure: Left Heart Cath and Coronary Angiography;  Surgeon: Runell Gess, MD;  Location: [facility] INVASIVE CV LAB;  Service: Cardiovascular;  Laterality: N/A;   LIPOMA EXCISION  08/2005   occipital lipoma 1.5cm - by Dr. Maryagnes Amos   LITHOTRIPSY  ~ 2000   MAZE Left 09/20/10   for paroxysmal atrial fibrillation (Dr. Donata Clay)   MITRAL VALVE REPLACEMENT  09/20/10    with a 27-mm pericardial porcine valve (Medtronic Mosaic valve, serial #[id number]). 09/20/10, Dr Donata Clay   ORIF CLAVICLE FRACTURE Right 01/2004   by Veverly Fells. Ophelia Charter, M.D for Right clavicle nonunion.; "it's got a pin in it"   POLYPECTOMY  05/29/2020   Procedure: POLYPECTOMY;  Surgeon: Vida Rigger, MD;  Location: [facility] ENDOSCOPY;  Service: Endoscopy;;   POLYPECTOMY  07/19/2021   Procedure: POLYPECTOMY;  Surgeon: Vida Rigger, MD;  Location: [facility] ENDOSCOPY;  Service: Gastroenterology;;   RADIOLOGY WITH ANESTHESIA Left 09/12/2020   Procedure: RADIOLOGY WITH ANESTHESIA- RENAL CRYOABLATION;  Surgeon: Irish Lack, MD;  Location: [facility] ORS;  Service: Radiology;  Laterality: Left;   REFRACTIVE SURGERY Bilateral    RIGHT HEART CATH N/A 04/23/2016   Procedure: Right Heart Cath;  Surgeon: Laurey Morale, MD;  Location: [facility] INVASIVE CV LAB;  Service: Cardiovascular;  Laterality: N/A;   TONSILLECTOMY     TUBAL LIGATION      FAMILY HISTORY: Family History  Problem Relation Age of Onset   Peptic Ulcer Disease Father    Heart attack Father 26       Died of MI at age 16   Heart attack Brother 45       Died of MI at age 23   Obesity Brother    Pneumonia Mother    Healthy Sister    Lupus Daughter    Obsessive Compulsive Disorder Daughter     SOCIAL HISTORY: Social History   Socioeconomic History   Marital status: Divorced    Spouse  name: Not on file   Number of children: Not on file   Years of education: Not on file   Highest education level: Not on file  Occupational History   Not on file  Tobacco Use   Smoking status: Every Day    Current packs/day: 1.00    Average packs/day: 1 pack/day for 60.4 years (60.4 ttl pk-yrs)    Types: Cigarettes    Start date: 11/01/1962   Smokeless tobacco: Never   Tobacco comments:    At least 1 PPD  Vaping Use   Vaping status: Never Used  Substance and Sexual Activity   Alcohol use: Not Currently    Comment: 02/19/2018 "nothing since 1999"   Drug use: Not Currently    Types: Marijuana    Comment: 02/19/2018 "nothing since the 1990s"   Sexual activity: Not Currently    Birth control/protection: Post-menopausal  Other Topics Concern   Not on file  Social History Narrative   Lives [place] in [place] ([address])   Worked at [employer] for 18 years   No car   Social Drivers of Corporate investment banker Strain: Low Risk  (05/01/2022)   Overall Financial Resource Strain (CARDIA)    Difficulty of Paying Living Expenses: Not hard at all  Food Insecurity: Food Insecurity Present (10/17/2022)   Hunger Vital Sign    Worried About Running Out of Food in the Last Year: Never true    Ran Out  of Food in the Last Year: Sometimes true  Transportation Needs: Unmet Transportation Needs (10/17/2022)   PRAPARE - Transportation    Lack of Transportation (Medical): Yes    Lack of Transportation (Non-Medical): Yes  Physical Activity: Inactive (05/01/2022)   Exercise Vital Sign    Days of Exercise per Week: 0 days    Minutes of Exercise per Session: 0 min  Stress: Stress Concern Present (05/01/2022)   [employer] of Occupational Health - Occupational Stress Questionnaire    Feeling of Stress : Rather much  Social Connections: Socially Isolated (05/01/2022)   Social Connection and Isolation Panel [NHANES]    Frequency of Communication with Friends and Family: More than  three times a week    Frequency of Social Gatherings with Friends and Family: More than three times a week    Attends Religious Services: Never    Database administrator or Organizations: No    Attends Banker Meetings: Never    Marital Status: Divorced  Catering manager Violence: Not At Risk (10/17/2022)   Humiliation, Afraid, Rape, and Kick questionnaire    Fear of Current or Ex-Partner: No    Emotionally Abused: No    Physically Abused: No    Sexually Abused: No    PHYSICAL EXAM  GENERAL EXAM/CONSTITUTIONAL: Vitals:  Vitals:   03/31/23 1426  BP: (!) 156/45  Pulse: 65  Weight: 125 lb 8 oz (56.9 kg)  Height: 5\' 2"  (1.575 m)   Body mass index is 22.95 kg/m. Wt Readings from Last 3 Encounters:  03/31/23 125 lb 8 oz (56.9 kg)  03/12/23 135 lb 11.2 oz (61.6 kg)  01/29/23 144 lb 8 oz (65.5 kg)   Patient is in no distress; well developed, nourished and groomed; neck is supple  MUSCULOSKELETAL: Gait, strength, tone, movements noted in Neurologic exam below  NEUROLOGIC: MENTAL STATUS:      No data to display         awake, alert, oriented to person, place and time recent and remote memory intact normal attention and concentration language fluent, comprehension intact, naming intact fund of knowledge appropriate     03/31/2023    2:36 PM  [facility] Cognitive Assessment   Visuospatial/ Executive (0/5) 5  Naming (0/3) 3  Attention: Read list of digits (0/2) 2  Attention: Read list of letters (0/1) 1  Attention: Serial 7 subtraction starting at 100 (0/3) 3  Language: Repeat phrase (0/2) 1  Language : Fluency (0/1) 0  Abstraction (0/2) 2  Delayed Recall (0/5) 3  Orientation (0/6) 6  Total 26  Adjusted Score (based on education) 27     CRANIAL NERVE:  2nd, 3rd, 4th, 6th- visual fields full to confrontation, extraocular muscles intact, no nystagmus 5th - facial sensation symmetric 7th - facial strength symmetric 8th - hearing intact 9th - palate  elevates symmetrically, uvula midline 11th - shoulder shrug symmetric 12th - tongue protrusion midline  MOTOR:  normal bulk and tone, full strength in the BUE, at least antigravity BLE  SENSORY:  normal and symmetric to light touch  COORDINATION:  finger-nose-finger, fine finger movements normal  GAIT/STATION:  Deferred, using a wheelchair      DIAGNOSTIC DATA (LABS, IMAGING, TESTING) - I reviewed patient records, labs, notes, testing and imaging myself where available.  Lab Results  Component Value Date   WBC 8.3 03/12/2023   HGB 10.6 (L) 03/12/2023   HCT 37.4 03/12/2023   MCV 79 03/12/2023   PLT 226 03/12/2023  Component Value Date/Time   NA 141 03/12/2023 1048   K 4.9 03/12/2023 1048   CL 101 03/12/2023 1048   CO2 25 03/12/2023 1048   GLUCOSE 193 (H) 03/12/2023 1048   GLUCOSE 87 12/18/2022 1141   BUN 14 03/12/2023 1048   CREATININE 0.98 03/12/2023 1048   CREATININE 0.94 12/18/2022 1141   CREATININE 0.91 08/21/2014 1027   CALCIUM 9.7 03/12/2023 1048   PROT 6.8 12/18/2022 1141   PROT 5.8 (L) 01/30/2022 1155   ALBUMIN 4.0 12/18/2022 1141   ALBUMIN 3.7 (L) 01/30/2022 1155   AST 13 (L) 12/18/2022 1141   ALT 8 12/18/2022 1141   ALKPHOS 69 12/18/2022 1141   BILITOT 0.5 12/18/2022 1141   GFRNONAA >60 12/18/2022 1141   GFRNONAA 66 08/21/2014 1027   GFRAA 58 (L) 09/26/2019 1317   GFRAA 77 08/21/2014 1027   Lab Results  Component Value Date   CHOL 118 12/02/2022   HDL 33 (L) 12/02/2022   LDLCALC 53 12/02/2022   TRIG 192 (H) 12/02/2022   CHOLHDL 3.6 12/02/2022   Lab Results  Component Value Date   HGBA1C 6.1 (A) 03/12/2023   Lab Results  Component Value Date   VITAMINB12 391 12/02/2022   Lab Results  Component Value Date   TSH 1.004 11/19/2022    Head CT 11/19/2022 1. No acute intracranial abnormality. 2. Age-commensurate cerebral atrophy and chronic small vessel ischemic disease. Chronic bilateral basal ganglia infarcts.    ASSESSMENT AND  PLAN  73 y.o. year old female with hypertension, hyperlipidemia, diabetes mellitus, chronic pain, fibromyalgia history of lung and heart disease who is presenting with cognitive impairment.  She tells me she feels fine, the initial referral was made back in October when she was seen for confusion and altered mental status.  At that time she was noted to be overmedicated, with benzodiazepine, opioid, sleep aid, gabapentin, which clouded her memory.  She scored 27 out of 30 on the MoCA today, indicative of normal cognition.  She tells me that she is independent in all activities of daily living, sometimes she is limited by her chronic pain.  At the moment, since patient is independent all actives of daily living, normal cognition, no further neurological testing indicated.  Advised her to continue following up with her PCP for management for chronic illnesses and to return as needed.  She voiced understanding.   1. Cognitive complaints with normal exam      Patient Instructions  Continue current medications Continue follow-up with PCP regarding management of diabetes Discussed smoking cessation Increase exercise, at least 20 minutes walking, 5 days a week  Return as needed    There are well-accepted and sensible ways to reduce risk for Alzheimers disease and other degenerative brain disorders .  Exercise Daily Walk A daily 20 minute walk should be part of your routine. Disease related apathy can be a significant roadblock to exercise and the only way to overcome this is to make it a daily routine and perhaps have a reward at the end (something your loved one loves to eat or drink perhaps) or a personal trainer coming to the home can also be very useful. Most importantly, the patient is much more likely to exercise if the caregiver / spouse does it with him/her. In general a structured, repetitive schedule is best.  General Health: Any diseases which effect your body will effect your brain such as  a pneumonia, urinary infection, blood clot, heart attack or stroke. Keep contact with your primary  care doctor for regular follow ups.  Sleep. A good nights sleep is healthy for the brain. Seven hours is recommended. If you have insomnia or poor sleep habits we can give you some instructions. If you have sleep apnea wear your mask.  Diet: Eating a heart healthy diet is also a good idea; fish and poultry instead of red meat, nuts (mostly non-peanuts), vegetables, fruits, olive oil or canola oil (instead of butter), minimal salt (use other spices to flavor foods), whole grain rice, bread, cereal and pasta and wine in moderation.Research is now showing that the MIND diet, which is a combination of The Mediterranean diet and the DASH diet, is beneficial for cognitive processing and longevity. Information about this diet can be found in The MIND Diet, a book by Alonna Minium, MS, RDN, and online at [email or web address]  Finances, Power of [address] and Advance Directives: You should consider putting legal safeguards in place with regard to financial and medical decision making. While the spouse always has power of attorney for medical and financial issues in the absence of any form, you should consider what you want in case the spouse / caregiver is no longer around or capable of making decisions.   No orders of the defined types were placed in this encounter.   No orders of the defined types were placed in this encounter.   Return if symptoms worsen or fail to improve.    Windell Norfolk, MD 04/01/2023, 10:23 AM  [facility] [address], [address] [place], [place] [address] [phone]

## 2023-03-31 NOTE — Patient Instructions (Signed)
Continue current medications Continue follow-up with PCP regarding management of diabetes Discussed smoking cessation Increase exercise, at least 20 minutes walking, 5 days a week  Return as needed    There are well-accepted and sensible ways to reduce risk for Alzheimers disease and other degenerative brain disorders .  Exercise Daily Walk A daily 20 minute walk should be part of your routine. Disease related apathy can be a significant roadblock to exercise and the only way to overcome this is to make it a daily routine and perhaps have a reward at the end (something your loved one loves to eat or drink perhaps) or a personal trainer coming to the home can also be very useful. Most importantly, the patient is much more likely to exercise if the caregiver / spouse does it with him/her. In general a structured, repetitive schedule is best.  General Health: Any diseases which effect your body will effect your brain such as a pneumonia, urinary infection, blood clot, heart attack or stroke. Keep contact with your primary care doctor for regular follow ups.  Sleep. A good nights sleep is healthy for the brain. Seven hours is recommended. If you have insomnia or poor sleep habits we can give you some instructions. If you have sleep apnea wear your mask.  Diet: Eating a heart healthy diet is also a good idea; fish and poultry instead of red meat, nuts (mostly non-peanuts), vegetables, fruits, olive oil or canola oil (instead of butter), minimal salt (use other spices to flavor foods), whole grain rice, bread, cereal and pasta and wine in moderation.Research is now showing that the MIND diet, which is a combination of The Mediterranean diet and the DASH diet, is beneficial for cognitive processing and longevity. Information about this diet can be found in The MIND Diet, a book by Alonna Minium, MS, RDN, and online at [email or web address]  Finances, Power of [address] and Advance  Directives: You should consider putting legal safeguards in place with regard to financial and medical decision making. While the spouse always has power of attorney for medical and financial issues in the absence of any form, you should consider what you want in case the spouse / caregiver is no longer around or capable of making decisions.

## 2023-03-31 NOTE — Telephone Encounter (Signed)
Ms Mcferran calls because her blood sugars have increased significantly and she does not know why and she denies symptoms of high blood sugar or change in diet or physical activity. She was stared on  effexor XR on 03/12/23 which can make it more difficult to control blood sugars.  She has not taken insulin since 03/05/23. She also recently stopped her GLP-1 RA.  Most recent A1C was 6.1% on 03/12/23.   Her CGM data shows increasing blood sugars as follows and since changing her CGM sensors twice since last week has been in the mid to high 300s to 400s:     She does not have a meter to confirm CGM data.   She requests a new one with supplies be sent to [facility].   No follow up is scheduled at this time for our office, she is due back around 04/09/23.

## 2023-03-31 NOTE — Telephone Encounter (Signed)
Call to pharmacy, insurance prefers [facility] so meter and supplies changed to [facility] [employer]

## 2023-04-01 ENCOUNTER — Other Ambulatory Visit (HOSPITAL_COMMUNITY): Payer: Self-pay

## 2023-04-01 ENCOUNTER — Other Ambulatory Visit: Payer: Self-pay

## 2023-04-01 MED ORDER — ALPRAZOLAM 0.5 MG PO TABS
0.5000 mg | ORAL_TABLET | Freq: Every day | ORAL | 0 refills | Status: DC
  Filled 2023-04-01 – 2023-04-07 (×2): qty 30, 30d supply, fill #0

## 2023-04-01 MED ORDER — HYDROCODONE-ACETAMINOPHEN 5-325 MG PO TABS
2.0000 | ORAL_TABLET | Freq: Two times a day (BID) | ORAL | 0 refills | Status: DC
  Filled 2023-04-01 – 2023-04-09 (×2): qty 120, 30d supply, fill #0

## 2023-04-01 NOTE — Telephone Encounter (Signed)
Patient left a message that she is not getting a meter today and her CGM still reads high; 302-400 today per CGM remote download.

## 2023-04-01 NOTE — Telephone Encounter (Signed)
Please address rx refill.

## 2023-04-02 ENCOUNTER — Other Ambulatory Visit (HOSPITAL_COMMUNITY): Payer: Self-pay

## 2023-04-02 ENCOUNTER — Other Ambulatory Visit: Payer: PPO

## 2023-04-02 ENCOUNTER — Other Ambulatory Visit (HOSPITAL_COMMUNITY): Payer: PPO

## 2023-04-03 ENCOUNTER — Other Ambulatory Visit: Payer: PPO

## 2023-04-04 ENCOUNTER — Telehealth: Payer: Self-pay | Admitting: Student

## 2023-04-04 NOTE — Telephone Encounter (Signed)
 Attempted to establish contact. Unable to leave VM x2

## 2023-04-05 ENCOUNTER — Telehealth: Payer: Self-pay | Admitting: Student

## 2023-04-05 NOTE — Telephone Encounter (Signed)
 Received message from operator to call [name] as she was concerned about blood glucose readings.  [name] tells me that, since self discontinuing her 44 units of long acting insulin  and ozempic sometime in Jan of 2025, her cGM has consistently been reporting cBGs >300. This is the second phone call for concerns of hyperglycemia. She has not been on glucose lowering medications since there. Today, she woke up ~2PM and had a sandwich. Later at night, she had an oatmeal cookie.  Does not remember snacking on any other foods. At the time of our chat, her blood sugar was 325.  Patient has cGM but finger stick glucometer with similar readings today. Does not know if it has been calibrated. She has changed the cGM as she worries there is a factory defect, but no changes in her glucose levels.  She eats rice pudding, oatmeals, TV dinners, and roast beef. She has also been drinking protein shakes; does not remember the name.  She denies catabolic symptoms, recent URI , changes in bowel habits, or LUTS, chest pain, recent alcohol use. She feels at her usual state. Has food at home and is able to hydrate without problems. She knows how to contact 911 if needed as she leaves alone and does not drive.   She currently has long acting insulin at home and 1mg  dose of Semaglutide (stopped >4 weeks ago). Her current weight is 125lb; neither she or her PCP recommend further weight loss.  Plan:  Since patient is asymptomatic, instructed to maintain hydration and monitor BG levels - If symptomatic or persistent elevations to return call - Patient has follow up appt with PCP on 2/25 - instructed to bring both glucometer and cGM to clinic to verify readings - Encouraged to increase protein intake and to be mindful of simple starches while getting better control of the BG  Patient expressed understanding and was in agreement with plan.

## 2023-04-06 ENCOUNTER — Telehealth: Payer: Self-pay | Admitting: Pharmacist

## 2023-04-06 ENCOUNTER — Other Ambulatory Visit (HOSPITAL_COMMUNITY): Payer: Self-pay

## 2023-04-06 NOTE — Telephone Encounter (Signed)
 Pt reports INR results from PST FS POC INR device performed today as 1.40 with a missed dose of 1.25 mg this past Saturday evening 04/04/23. No new medications. No bleeding. Advised to INCREASE dose as follows to 1 x 2.5 mg warfarin PO qd. Repeat INR 04/13/23.

## 2023-04-07 ENCOUNTER — Other Ambulatory Visit: Payer: Self-pay

## 2023-04-07 ENCOUNTER — Ambulatory Visit: Payer: PPO | Admitting: Internal Medicine

## 2023-04-07 ENCOUNTER — Telehealth: Payer: Self-pay | Admitting: *Deleted

## 2023-04-07 ENCOUNTER — Other Ambulatory Visit (HOSPITAL_COMMUNITY): Payer: Self-pay

## 2023-04-07 VITALS — BP 154/42 | HR 79 | Temp 97.6°F | Ht 62.0 in | Wt 134.6 lb

## 2023-04-07 DIAGNOSIS — I10 Essential (primary) hypertension: Secondary | ICD-10-CM

## 2023-04-07 DIAGNOSIS — G479 Sleep disorder, unspecified: Secondary | ICD-10-CM | POA: Diagnosis not present

## 2023-04-07 DIAGNOSIS — G3184 Mild cognitive impairment, so stated: Secondary | ICD-10-CM

## 2023-04-07 DIAGNOSIS — Z85528 Personal history of other malignant neoplasm of kidney: Secondary | ICD-10-CM

## 2023-04-07 DIAGNOSIS — D509 Iron deficiency anemia, unspecified: Secondary | ICD-10-CM | POA: Diagnosis not present

## 2023-04-07 DIAGNOSIS — R634 Abnormal weight loss: Secondary | ICD-10-CM

## 2023-04-07 DIAGNOSIS — R52 Pain, unspecified: Secondary | ICD-10-CM

## 2023-04-07 DIAGNOSIS — I5032 Chronic diastolic (congestive) heart failure: Secondary | ICD-10-CM | POA: Diagnosis not present

## 2023-04-07 DIAGNOSIS — E1169 Type 2 diabetes mellitus with other specified complication: Secondary | ICD-10-CM

## 2023-04-07 DIAGNOSIS — R296 Repeated falls: Secondary | ICD-10-CM

## 2023-04-07 DIAGNOSIS — F322 Major depressive disorder, single episode, severe without psychotic features: Secondary | ICD-10-CM

## 2023-04-07 DIAGNOSIS — Z794 Long term (current) use of insulin: Secondary | ICD-10-CM

## 2023-04-07 DIAGNOSIS — I480 Paroxysmal atrial fibrillation: Secondary | ICD-10-CM

## 2023-04-07 DIAGNOSIS — I110 Hypertensive heart disease with heart failure: Secondary | ICD-10-CM

## 2023-04-07 MED ORDER — VENLAFAXINE HCL ER 150 MG PO CP24
150.0000 mg | ORAL_CAPSULE | Freq: Every day | ORAL | 3 refills | Status: AC
  Filled 2023-04-07: qty 90, 90d supply, fill #0
  Filled 2023-07-18: qty 90, 90d supply, fill #1
  Filled 2023-10-13: qty 90, 90d supply, fill #2
  Filled 2024-01-11: qty 90, 90d supply, fill #3

## 2023-04-07 MED ORDER — FUROSEMIDE 40 MG PO TABS
20.0000 mg | ORAL_TABLET | Freq: Every day | ORAL | 3 refills | Status: DC
  Filled 2023-04-07: qty 90, 180d supply, fill #0
  Filled 2023-04-09: qty 50, 100d supply, fill #0
  Filled 2023-05-06 – 2023-05-11 (×2): qty 90, 180d supply, fill #0
  Filled 2023-05-26: qty 50, 100d supply, fill #0

## 2023-04-07 MED ORDER — METOPROLOL SUCCINATE ER 25 MG PO TB24
25.0000 mg | ORAL_TABLET | Freq: Every day | ORAL | 3 refills | Status: DC
  Filled 2023-04-07: qty 45, 45d supply, fill #0
  Filled 2023-04-09: qty 90, 90d supply, fill #0
  Filled 2023-05-06 – 2023-06-05 (×4): qty 45, 45d supply, fill #0

## 2023-04-07 NOTE — Progress Notes (Signed)
 73 y.o. [name] is here for routine follow-up of multiple chronic conditions for which she is seen at short intervals.  Since last visit patient has attended in neurology consultation for memory impairment, a referral which was placed during a formal hospitalization at which time she was over medicated, sedated, and confused.  Since that time medication adjustments had been made and her recall and memory had significantly improved.  The neurologist found her to be cognitively normal after she performed well on a MoCA.  [name] is currently dealing with severe hyperglycemia.  She had stopped her GLP-1 a through a joint decision based on no desire for further weight loss, and she herself had subsequently discontinued her insulin based on some low morning sugars (weight loss had probably led to decreased insulin requirement).  At last visit we had recognized hypotension and hypovolemia for which her beta-blocker and Lasix doses were reduced; her Cymbalta was transitioned to venlafaxine; and her pallor and pica were evaluated with repeat iron studies which revealed severe iron deficiency.  I had reached out to her hematologist through staff communications though there has been no change in therapy and her previously scheduled hematology follow-up is in April.  [name] continues to feel chilly with decreased energy.  Breathing has been okay.  She was able to finally obtain her original albuterol inhaler through the efforts of a kind pharmacist to investigated which company made it, and this has been dispensed which makes her very happy.  She has follow-up scans scheduled for kidney cancer surveillance and an upcoming vascular follow-up.  Mood seems to be doing okay so far on well-tolerated venlafaxine.  She has had no falls since the last visit.  And pain today because she has not had her medications yet.  Patient Active Problem List   Diagnosis Date Noted   Weight loss 03/12/2023    Atrial fibrillation (HCC) 12/23/2022   History of atrial fibrillation 10/18/2022   Chronic anticoagulation 10/18/2022   History of fracture of left hip 10/17/2022   Generalized pain 08/01/2022   Multifactorial functional impairment 02/13/2022   History of stroke without residual deficits 01/17/2022   Mild cognitive impairment with memory loss 12/23/2021   Diabetic peripheral neuropathy associated with type 2 diabetes mellitus (HCC) 11/11/2021   Recurrent falls 11/11/2021   Gait instability 09/19/2021   Perennial non-allergic rhinitis 09/19/2021   Chronic, continuous use of opioids for chronic pain 07/10/2021   Polypharmacy 11/08/2020   Personal history of renal cell carcinoma 09/12/2020   Tinea pedis of both feet 08/07/2020   Essential tremor 11/17/2019   Nocturnal hypoxia per sleep study 07/2019    Carotid artery stenosis; s/p R endarterectomy    COPD mixed type (HCC)    Arthritis of both hands 07/01/2019   Stage 2 chronic kidney disease due to type 2 diabetes mellitus (HCC) 12/16/2018   Lichen sclerosus of female genitalia 01/12/2017   Mixed stress and urge urinary incontinence 05/27/2016   Pulmonary hypertension due to chronic obstructive pulmonary disease (HCC) 04/25/2016   Asymptomatic cholelithiasis 09/25/2015   Chronic iron deficiency anemia 12/26/2014   Aortic atherosclerosis (HCC) 10/19/2014   Right nephrolithiasis, asymptomatic (incidental finding) 09/06/2014   Headache, chronic intermittent 01/14/2013   Chronic low back pain 10/06/2012   Long term current use of anticoagulant therapy 09/30/2012   Recurrent candidal intertrigo 07/28/2012   Tobacco use disorder 07/28/2012   History of colonic polyps 05/14/2011   Sleep disturbance 05/14/2011   Abdominal wall hernia 05/14/2011  Constipation due to opioid therapy 02/03/2011   Health care maintenance 11/04/2010   PAF (paroxysmal atrial fibrillation) (HCC) 10/22/2010   Chronic diastolic heart failure (HCC) 10/21/2010    Fibromyalgia 08/29/2010   Gastroesophageal reflux disease    Type 2 diabetes mellitus with other specified complication (HCC)    Osteoporosis with current pathological fracture with routine healing    Peripheral arterial disease (HCC) 05/27/2010   History of mitral valve replacement with bioprosthetic valve 2012   Essential hypertension 06/19/2008   Hyperlipidemia 11/20/2005   Severe major depression (HCC) 11/19/2005    Current Outpatient Medications:    albuterol (VENTOLIN HFA) 108 (90 Base) MCG/ACT inhaler, Inhale 2 puffs into the lungs every 6 hours as needed for shortness of breath, Disp: 54 g, Rfl: 10   ALPRAZolam (XANAX) 0.5 MG tablet, Take 1 tablet (0.5 mg total) by mouth at bedtime. To help with sleep., Disp: 30 tablet, Rfl: 0   Blood Glucose Monitoring Suppl (ONETOUCH VERIO FLEX SYSTEM) w/Device KIT, Use to check blood sugar up to 7 times a week (Patient not taking: Reported on 03/31/2023), Disp: 1 kit, Rfl: 1   buPROPion (WELLBUTRIN XL) 150 MG 24 hr tablet, Take 1 tablet (150 mg total) by mouth every morning., Disp: 90 tablet, Rfl: 3   Continuous Glucose Sensor (FREESTYLE LIBRE 3 SENSOR) MISC, Place 1 sensor on the skin every 14 days. Use to check glucose 6 times daily as directed, Disp: 6 each, Rfl: 3   Fluticasone-Umeclidin-Vilant (TRELEGY ELLIPTA) 100-62.5-25 MCG/ACT AEPB, Inhale 1 puff into the lungs daily., Disp: 180 each, Rfl: 3   furosemide (LASIX) 40 MG tablet, Take 0.5 tablets (20 mg total) by mouth daily., Disp: 90 tablet, Rfl: 3   gabapentin (NEURONTIN) 600 MG tablet, Take 1 tablet (600 mg total) by mouth at bedtime., Disp: 90 tablet, Rfl: 3   glucose blood (ACCU-CHEK GUIDE TEST) test strip, Use to check blood sugar up to 7 times a week (Patient not taking: Reported on 03/31/2023), Disp: 100 each, Rfl: 3   HYDROcodone-acetaminophen (NORCO/VICODIN) 5-325 MG tablet, Take 2 tablets by mouth 2 (two) times daily., Disp: 120 tablet, Rfl: 0   ipratropium (ATROVENT) 0.03 % nasal  spray, Instill 2 sprays into the nose 3 (three) times daily as directed, Disp: 30 mL, Rfl: 2   ipratropium-albuterol (DUONEB) 0.5-2.5 (3) MG/3ML SOLN, Inhale 3 mLs by nebulization every 6 (six) hours as needed (shortness of breath, wheezing)., Disp: 90 mL, Rfl: 3   ketoconazole (NIZORAL) 2 % cream, Apply 1 application topically at bedtime as needed for irritation., Disp: 60 g, Rfl: 5   Lancets (ONETOUCH DELICA PLUS LANCET33G) MISC, Use to check blood sugar up to 7 times a week (Patient not taking: Reported on 03/31/2023), Disp: 100 each, Rfl: 3   lidocaine-prilocaine (EMLA) cream, Apply 1 Application topically as needed., Disp: 30 g, Rfl: 0   linaclotide (LINZESS) 72 MCG capsule, Take 1 capsule (72 mcg total) by mouth daily before breakfast., Disp: 90 capsule, Rfl: 3   metoprolol succinate (TOPROL XL) 25 MG 24 hr tablet, Take 1 tablet (25 mg total) by mouth daily., Disp: 45 tablet, Rfl: 3   omeprazole (PRILOSEC) 40 MG capsule, Take 1 capsule (40 mg total) by mouth 2 (two) times daily., Disp: 60 capsule, Rfl: 5   ondansetron (ZOFRAN) 4 MG tablet, Take 1 tablet (4 mg total) by mouth daily as needed for nausea or vomiting., Disp: 30 tablet, Rfl: 1   OXYGEN, Inhale 2 L into the lungs at bedtime. (Patient not  taking: Reported on 03/31/2023), Disp: , Rfl:    potassium chloride (KLOR-CON M) 10 MEQ tablet, Take 1 tablet (10 mEq total) by mouth daily., Disp: 30 tablet, Rfl: 11   ramelteon (ROZEREM) 8 MG tablet, Take 1 tablet (8 mg total) by mouth at bedtime., Disp: 30 tablet, Rfl: 3   rosuvastatin (CRESTOR) 20 MG tablet, Take 1 tablet (20 mg total) by mouth at bedtime., Disp: 90 tablet, Rfl: 3   venlafaxine XR (EFFEXOR-XR) 150 MG 24 hr capsule, Take 1 capsule (150 mg total) by mouth daily with breakfast., Disp: 90 capsule, Rfl: 3   warfarin (COUMADIN) 2.5 MG tablet, Take 1 tablet (2.5 mg total) by mouth daily unless otherwise instructed, Disp: 90 tablet, Rfl: 3  Functional Status: Continues to live alone, with  stop-ins by her daughter and by her sister Meriam Sprague, who is here today). Medications are mailed to her by [facility]. Independent in [place] though no longer drives. Uses assistive device in the home and is very minimally active due to pain. Wears incontinence garment; difficult to move quickly to the toilet.    Objective (has not taken medications yet today) BP (!) 154/42 (BP Location: Left Arm, Patient Position: Sitting)   Pulse 79   Temp 97.6 F (36.4 C) (Oral)   Ht 5\' 2"  (1.575 m)   Wt 134 lb 9.6 oz (61.1 kg)   SpO2 95%   BMI 24.62 kg/m   Exam: Pale appearing, bright affect, easy to smile and laugh.  Occasional cough sounds harsh, nonproductive; her laugh is hoarse, also chronic.  Lungs are more clear than usual with good airflow, scattered rhonchi.  No labored breathing.  Heart is regular rate and rhythm today, usually irregular.  Skin turgor is decreased.  Her hands are pale with decreased cap refill.  No lower extremity edema.  Assessment and Plan: Chronic iron deficiency anemia Assessment & Plan: Had not heard back from hematologist.  Fe very low at last visit; will recheck cbc today to see if a blood transfusion is also needed.   Orders: -     CBC  Type 2 diabetes mellitus with other specified complication, with long-term current use of insulin (HCC) Assessment & Plan: CBGs have been consistently in very high range.  Will restart insulin degludec at 1/2 her previous dose, about 20 units (was 44-45).     Chronic diastolic heart failure (HCC) Assessment & Plan: Bmp 3 weeks ago no significant dehydration, as may occur from glucosuria.  She is drinking adequately.  Today periphery is warm though skin turgor is reduced (we had also decreased her baseline lasix from 80 to 40 at last visit).  Will further reduce lasix to 20 mg.     Chronic congestive heart failure with left ventricular diastolic dysfunction (HCC) -     Furosemide; Take 0.5 tablets (20 mg total) by mouth daily.   Dispense: 90 tablet; Refill: 3 -     Metoprolol Succinate ER; Take 1 tablet (25 mg total) by mouth daily.  Dispense: 45 tablet; Refill: 3  Sleep disturbance Assessment & Plan: Using alprazolam preferentially rather than the ramelteon.  Discussed this - she will take the ramelteon first, then use the alprazolam only if needed.    Severe major depression (HCC) Assessment & Plan: Mood is much improved! At last visit changed duloxetine to venlafaxine - will now increase venlafaxine to 150 mg daily.    Orders: -     Venlafaxine HCl ER; Take 1 capsule (150 mg total) by mouth  daily with breakfast.  Dispense: 90 capsule; Refill: 3  Recurrent falls Assessment & Plan: No falls since last visit.    Personal history of renal cell carcinoma Assessment & Plan: Upcoming surveillance scans have been ordered by Dr. Haydee Salter.    Mild cognitive impairment with memory loss Assessment & Plan: Recent visit with neurologist (referral made when she was overmedicated) - did well on MoCA.  No f/u needed.     Essential hypertension Assessment & Plan: Uncontrolled though hasn't had her medicines yet.  Lasix reduced at last visit for hypovoluemia, will reduce again today. Ok to return to full pill of beta blocker (Toprol 25 daily)   PAF (paroxysmal atrial fibrillation) (HCC) Assessment & Plan: Auscultates NSR today.     Generalized pain -     Venlafaxine HCl ER; Take 1 capsule (150 mg total) by mouth daily with breakfast.  Dispense: 90 capsule; Refill: 3     Return in about 4 weeks (around 05/05/2023) for chronic condition monitoring, symptom re-check, functional reassessment.

## 2023-04-07 NOTE — Patient Instructions (Signed)
 [name],  Today we are rechecking your blood counts (I'll let you know what to do when results return).   We are restarting your insulin degludec at 20 units daily.   We are increasing your Effexor from 75 mg to 150 mg daily (you can take 2 pills of the 75 until they are gone). We are reducing your lasix from 40 mg to 20 mg daily. We are increasing your Toprol XL back to the full dose of 25 mg.  I'll see you in a month.  You look good today, by the way. :)  Don't fall down.  Dr. [name]

## 2023-04-07 NOTE — Assessment & Plan Note (Signed)
 Bmp 3 weeks ago no significant dehydration, as may occur from glucosuria.  She is drinking adequately.  Today periphery is warm though skin turgor is reduced (we had also decreased her baseline lasix from 80 to 40 at last visit).  Will further reduce lasix to 20 mg.

## 2023-04-07 NOTE — Assessment & Plan Note (Addendum)
 CBGs have been consistently in very high range.  Will restart insulin degludec at 1/2 her previous dose, about 20 units (was 44-45).  Will remain off GLP-1 a due to achieving maximum desired weight loss.

## 2023-04-07 NOTE — Assessment & Plan Note (Signed)
 Had not heard back from hematologist.  Fe very low at last visit; will recheck cbc today to see if a blood transfusion is also needed.

## 2023-04-07 NOTE — Telephone Encounter (Signed)
 Call from [facility] with [facility].  Patient was discharged from their services last week as she has completed treatment. Walks  without assistance.  Only problem is with bending and reaching improperly.   Loss in weight 2/6/she was 138 lb.  2/13 she was 136.2 lbs.  2/19 135.6 lbs.

## 2023-04-07 NOTE — Assessment & Plan Note (Signed)
No falls since last visit.

## 2023-04-07 NOTE — Assessment & Plan Note (Signed)
 Uncontrolled though hasn't had her medicines yet.  Lasix reduced at last visit for hypovoluemia, will reduce again today. Ok to return to full pill of beta blocker (Toprol 25 daily)

## 2023-04-07 NOTE — Assessment & Plan Note (Signed)
 Weight has remained steady at 135 pounds since GLP-1 a was discontinued about 4 weeks ago.  Maximum desired weight loss has occurred.

## 2023-04-07 NOTE — Assessment & Plan Note (Signed)
 Mood is much improved! At last visit changed duloxetine to venlafaxine - will now increase venlafaxine to 150 mg daily.

## 2023-04-07 NOTE — Assessment & Plan Note (Signed)
 Upcoming surveillance scans have been ordered by Dr. Haydee Salter.

## 2023-04-07 NOTE — Assessment & Plan Note (Signed)
 Using alprazolam preferentially rather than the ramelteon.  Discussed this - she will take the ramelteon first, then use the alprazolam only if needed.

## 2023-04-07 NOTE — Assessment & Plan Note (Signed)
 Auscultates NSR today.

## 2023-04-07 NOTE — Assessment & Plan Note (Signed)
 Recent visit with neurologist (referral made when she was overmedicated) - did well on MoCA.  No f/u needed.

## 2023-04-08 ENCOUNTER — Telehealth: Payer: Self-pay | Admitting: *Deleted

## 2023-04-08 ENCOUNTER — Ambulatory Visit: Payer: PPO

## 2023-04-08 VITALS — Ht 62.0 in | Wt 135.0 lb

## 2023-04-08 DIAGNOSIS — Z0000 Encounter for general adult medical examination without abnormal findings: Secondary | ICD-10-CM | POA: Diagnosis not present

## 2023-04-08 LAB — CBC
Hematocrit: 34 % (ref 34.0–46.6)
Hemoglobin: 9.7 g/dL — ABNORMAL LOW (ref 11.1–15.9)
MCH: 21.9 pg — ABNORMAL LOW (ref 26.6–33.0)
MCHC: 28.5 g/dL — ABNORMAL LOW (ref 31.5–35.7)
MCV: 77 fL — ABNORMAL LOW (ref 79–97)
Platelets: 201 10*3/uL (ref 150–450)
RBC: 4.43 x10E6/uL (ref 3.77–5.28)
RDW: 16.8 % — ABNORMAL HIGH (ref 11.7–15.4)
WBC: 6.7 10*3/uL (ref 3.4–10.8)

## 2023-04-08 NOTE — Progress Notes (Signed)
 Subjective:   [name] is a 73 y.o. who presents for a Medicare Wellness preventive visit.  Visit Complete: Virtual I connected with  [name] on 04/08/23 by a video and audio enabled telemedicine application and verified that I am speaking with the correct person using two identifiers.  Patient Location: Home  Provider Location: Office/Clinic  I discussed the limitations of evaluation and management by telemedicine. The patient expressed understanding and agreed to proceed.  Vital Signs: Because this visit was a virtual/telehealth visit, some criteria may be missing or patient reported. Any vitals not documented were not able to be obtained and vitals that have been documented are patient reported.   AWV Questionnaire: No: Patient Medicare AWV questionnaire was not completed prior to this visit.  Cardiac Risk Factors include: advanced age (>62men, >48 women);diabetes mellitus;dyslipidemia;family history of premature cardiovascular disease;hypertension;sedentary lifestyle     Objective:    Today's Vitals   04/08/23 1042  Weight: 135 lb (61.2 kg)  Height: 5\' 2"  (1.575 m)  PainSc: 8   PainLoc: Generalized   Body mass index is 24.69 kg/m.     04/08/2023   10:46 AM 12/02/2022   10:30 AM 11/16/2022    2:34 PM 10/17/2022    8:00 PM 10/09/2022    9:52 AM 09/25/2022    1:20 PM 09/25/2022    8:52 AM  Advanced Directives  Does Patient Have a Medical Advance Directive? No No No No No No No  Would patient like information on creating a medical advance directive? No - Patient declined No - Patient declined  No - Patient declined No - Patient declined No - Patient declined No - Patient declined    Current Medications (verified) Outpatient Encounter Medications as of 04/08/2023  Medication Sig   albuterol (VENTOLIN HFA) 108 (90 Base) MCG/ACT inhaler Inhale 2 puffs into the lungs every 6 hours as needed for shortness of breath   ALPRAZolam (XANAX) 0.5 MG  tablet Take 1 tablet (0.5 mg total) by mouth at bedtime. To help with sleep.   Blood Glucose Monitoring Suppl (ONETOUCH VERIO FLEX SYSTEM) w/Device KIT Use to check blood sugar up to 7 times a week (Patient not taking: Reported on 03/31/2023)   buPROPion (WELLBUTRIN XL) 150 MG 24 hr tablet Take 1 tablet (150 mg total) by mouth every morning.   Continuous Glucose Sensor (FREESTYLE LIBRE 3 SENSOR) MISC Place 1 sensor on the skin every 14 days. Use to check glucose 6 times daily as directed   Fluticasone-Umeclidin-Vilant (TRELEGY ELLIPTA) 100-62.5-25 MCG/ACT AEPB Inhale 1 puff into the lungs daily.   furosemide (LASIX) 40 MG tablet Take 0.5 tablets (20 mg total) by mouth daily.   gabapentin (NEURONTIN) 600 MG tablet Take 1 tablet (600 mg total) by mouth at bedtime.   glucose blood (ACCU-CHEK GUIDE TEST) test strip Use to check blood sugar up to 7 times a week (Patient not taking: Reported on 03/31/2023)   HYDROcodone-acetaminophen (NORCO/VICODIN) 5-325 MG tablet Take 2 tablets by mouth 2 (two) times daily.   ipratropium (ATROVENT) 0.03 % nasal spray Instill 2 sprays into the nose 3 (three) times daily as directed   ipratropium-albuterol (DUONEB) 0.5-2.5 (3) MG/3ML SOLN Inhale 3 mLs by nebulization every 6 (six) hours as needed (shortness of breath, wheezing).   ketoconazole (NIZORAL) 2 % cream Apply 1 application topically at bedtime as needed for irritation.   Lancets (ONETOUCH DELICA PLUS LANCET33G) MISC Use to check blood sugar up to 7 times a week (Patient not taking:  Reported on 03/31/2023)   lidocaine-prilocaine (EMLA) cream Apply 1 Application topically as needed.   linaclotide (LINZESS) 72 MCG capsule Take 1 capsule (72 mcg total) by mouth daily before breakfast.   metoprolol succinate (TOPROL XL) 25 MG 24 hr tablet Take 1 tablet (25 mg total) by mouth daily.   omeprazole (PRILOSEC) 40 MG capsule Take 1 capsule (40 mg total) by mouth 2 (two) times daily.   ondansetron (ZOFRAN) 4 MG tablet Take 1  tablet (4 mg total) by mouth daily as needed for nausea or vomiting.   OXYGEN Inhale 2 L into the lungs at bedtime. (Patient not taking: Reported on 03/31/2023)   potassium chloride (KLOR-CON M) 10 MEQ tablet Take 1 tablet (10 mEq total) by mouth daily.   ramelteon (ROZEREM) 8 MG tablet Take 1 tablet (8 mg total) by mouth at bedtime.   rosuvastatin (CRESTOR) 20 MG tablet Take 1 tablet (20 mg total) by mouth at bedtime.   venlafaxine XR (EFFEXOR-XR) 150 MG 24 hr capsule Take 1 capsule (150 mg total) by mouth daily with breakfast.   warfarin (COUMADIN) 2.5 MG tablet Take 1 tablet (2.5 mg total) by mouth daily unless otherwise instructed   No facility-administered encounter medications on file as of 04/08/2023.    Allergies (verified) Dexilant [dexlansoprazole], Lorazepam, Oxycontin [oxycodone], and Tramadol hcl   History: Past Medical History:  Diagnosis Date   Abnormality of lung on CXR 02/02/2020   Nonspecific finding on CXR ordered by pulmonologist - c/w inflammation vs infection, f/u imaging suggested, Dr. Roxy Cedar office has been in communication about recommended next steps.   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   Non-bleeding when visualized on capsule endoscopy 06/30/2015    Arthritis    "lower back; hands" (02/19/2018)   Asthma    Asymptomatic cholelithiasis 09/25/2015   Seen on CT scan 08/2015   Carotid artery stenosis; s/p R endarterectomy    s/p right endarterectomy (06/2010) Carotid US (07/2010):  Left: Moderate-to-severe (60-79%) calcific and non-calcific plaque origin and proximal ICA and ECA    Chronic congestive heart failure with left ventricular diastolic dysfunction (HCC) 10/21/2010   Chronic constipation 02/03/2011   Chronic daily headache 01/16/2014   Chronic iron deficiency anemia    Chronic low back pain 10/06/2012   Chronic venous  insufficiency 08/04/2012   Closed fracture of one rib of left side 08/23/2021   ED visit after a fall 08/16/21    COPD (chronic obstructive pulmonary disease) with emphysema (HCC)    PFTs 2018: severe obstructive disease, insignif response to bronchiodilator, mild restriction parenchymal pattern, moderately severe diffusion defect. 2014  FEV1 0.92 (40%), ratio 69, 27% increase in FEV1 with BD, TLC 91%, severe airtrapping, DLCO49% On chronic home O2. Pulmonary rehab referral 05/2012    Dyspnea    Fibromyalgia 08/29/2010   Gastroesophageal reflux disease    History of blood transfusion    "several times"  (02/19/2018)   History of clear cell renal cell carcinoma (HCC), in remission 07/21/2011   s/p cryoablation of left RCC in 09/2011 by Dr. Fredia Sorrow. Followed by Dr. Retta Diones  [facility]) .     History of fracture of left hip 10/17/2022   History of hiatal hernia    History of mitral valve replacement with bioprosthetic valve due to mitral stenosis 2012   s/p MVR with a 27-mm pericardial porcine valve (Medtronic Mosaic valve, serial #[id number] on 09/20/10, Dr.  Donata Clay)    History of obstructive sleep apnea, resolved 2013   resolved per sleep study 07/2019; no apnea, but did have desaturation.  CPAP no longer necessary.  Nocturnal polysomnography (06/2009): Moderate sleep apnea/ hypopnea syndrome , AHI 17.8 per hour with nonpositional hypopneas. CPAP titration to 12 CWP, AHI 2.4 per hour. On nocturnal CPAP via a small resMed Quattro full-face mask with heated humidifier.    History of pneumonia    "once"  (02/19/2018)   History of seborrheic keratosis 09/28/2015   History of stroke without residual deficits 01/17/2022   Hyperlipidemia LDL goal < 100 11/20/2005   Internal and external hemorrhoids without complication 08/04/2012   Lesion of left native kidney 06/01/2020   Incidental finding on recent screening chest CT for lung ca ordered by Dr. Maple Hudson pulmonology - he has ordered a f/u renal  U/S.    Lichen sclerosus of female genitalia 01/12/2017   Migraine    "none in years" (02/19/2018)   Mild cognitive impairment with memory loss 12/23/2021   Moderately severe major depression (HCC) 11/19/2005   Nocturnal hypoxia per sleep study 07/2019    Osteoporosis    DEXA 2016: T -2.7; DEXA (12/09/2011): L-spine T -3.7, left hip T -1.4 DEXA (12/2004): L-spine T -2.6, left hip -0.1    Paroxysmal atrial fibrillation (HCC) 10/22/2010   s/p Left atrial maze procedure for paroxysmal atrial fibrillation on 09/20/2010 by Dr Donata Clay.  Subsequent splenic infarct, decision was made to re-anticoagulate with coumadin, likely life-long as this is the most likely cause of the splenic infarct.    Personal history of colonic polyps 05/14/2011   Colonoscopy (05/2011): 4 mm adenomatous polyp excised endoscopically Colonoscopy (02/2002): Adenomatous polyp excised endoscopically    Personal history of renal cell carcinoma 09/12/2020   Pneumonia    Pulmonary hypertension due to chronic obstructive pulmonary disease (HCC) 04/25/2016   2014 TEE w PA peak pressure 46 mmHg, s/p MV replacement    Right nephrolithiasis, asymptomatic, incidental finding 09/06/2014   5 mm non-obstructing calculus seen on CT scan 09/05/2014    Right ventricular failure (HCC) 04/25/2016   Severe obesity (BMI 35.0-39.9) with comorbidity (HCC) 10/23/2011   Sleep apnea    Stage 2 chronic kidney disease due to type 2 diabetes mellitus (HCC) 12/16/2018   CKD stage III     Tobacco abuse 07/28/2012   Type 2 diabetes mellitus with diabetic neuropathy [facility])    Past Surgical History:  Procedure Laterality Date   BIOPSY  07/19/2021   Procedure: BIOPSY;  Surgeon: Vida Rigger, MD;  Location: [facility] ENDOSCOPY;  Service: Gastroenterology;;   CARDIAC CATHETERIZATION     CARDIAC VALVE REPLACEMENT  Aug. 2012   "mitral valve"   CAROTID ENDARTERECTOMY Right 07/04/2010   by Dr. Myra Gianotti for asymptomatic right carotid artery stenosis   CATARACT EXTRACTION  W/ INTRAOCULAR LENS  IMPLANT, BILATERAL Bilateral    CHEST TUBE INSERTION  09/24/2010   Dr Zenaida Niece Trigt   COLONOSCOPY  05/12/2011   performed by Dr. Bosie Clos. Showing small internal hemorrhoids, single tubular adenoma polyp   COLONOSCOPY WITH PROPOFOL N/A 05/29/2020   Procedure: COLONOSCOPY WITH PROPOFOL;  Surgeon: Vida Rigger, MD;  Location: [facility];  Service: Endoscopy;  Laterality: N/A;   COLONOSCOPY WITH PROPOFOL N/A 07/19/2021   Procedure: COLONOSCOPY WITH PROPOFOL;  Surgeon: Vida Rigger, MD;  Location: [facility];  Service: Gastroenterology;  Laterality: N/A;   CRYOABLATION Left 09/2011   by Dr. Fredia Sorrow. Followed by Dr. Retta Diones  [facility]) .  DILATION AND CURETTAGE OF UTERUS     ESOPHAGOGASTRODUODENOSCOPY  05/12/2011   performed by Dr. Bosie Clos. Negative for ulcerations, biopsy negative for evidence of celiac sprue   ESOPHAGOGASTRODUODENOSCOPY N/A 06/29/2015   Procedure: ESOPHAGOGASTRODUODENOSCOPY (EGD);  Surgeon: Vida Rigger, MD;  Location: [facility] ENDOSCOPY;  Service: Endoscopy;  Laterality: N/A;   ESOPHAGOGASTRODUODENOSCOPY N/A 03/29/2016   Procedure: ESOPHAGOGASTRODUODENOSCOPY (EGD);  Surgeon: Vida Rigger, MD;  Location: [facility] ENDOSCOPY;  Service: Endoscopy;  Laterality: N/A;   ESOPHAGOGASTRODUODENOSCOPY (EGD) WITH PROPOFOL N/A 05/29/2020   Procedure: ESOPHAGOGASTRODUODENOSCOPY (EGD) WITH PROPOFOL;  Surgeon: Vida Rigger, MD;  Location: [facility] ENDOSCOPY;  Service: Endoscopy;  Laterality: N/A;   FRACTURE SURGERY     GIVENS CAPSULE STUDY N/A 06/30/2015   Procedure: GIVENS CAPSULE STUDY;  Surgeon: Vida Rigger, MD;  Location: [facility] ENDOSCOPY;  Service: Endoscopy;  Laterality: N/A;   GIVENS CAPSULE STUDY N/A 06/29/2015   Procedure: GIVENS CAPSULE STUDY;  Surgeon: Vida Rigger, MD;  Location: [facility] ENDOSCOPY;  Service: Endoscopy;  Laterality: N/A;   HEMORRHOID SURGERY  1970s?   "lanced"   HYSTEROSCOPY W/ ENDOMETRIAL ABLATION  06/2001   for persistent post-menopausal bleeding // by S. Kyra Manges, M.D.    INTRAMEDULLARY (IM) NAIL INTERTROCHANTERIC Left 10/19/2022   Procedure: LEFT INTRAMEDULLARY (IM) NAIL INTERTROCHANTERIC;  Surgeon: Luci Bank, MD;  Location: [facility] OR;  Service: Orthopedics;  Laterality: Left;   IR GENERIC HISTORICAL  08/23/2015   IR RADIOLOGIST EVAL & MGMT 08/23/2015 Irish Lack, MD [facility] INTERV RAD   IR GENERIC HISTORICAL  04/09/2016   IR RADIOLOGIST EVAL & MGMT 04/09/2016 Irish Lack, MD [facility] INTERV RAD   IR RADIOLOGIST EVAL & MGMT  10/07/2016   IR RADIOLOGIST EVAL & MGMT  06/25/2017   IR RADIOLOGIST EVAL & MGMT  07/31/2020   IR RADIOLOGIST EVAL & MGMT  10/02/2020   IR RADIOLOGIST EVAL & MGMT  12/20/2020   IR RADIOLOGIST EVAL & MGMT  12/25/2021   LEFT HEART CATH AND CORONARY ANGIOGRAPHY N/A 04/21/2016   Procedure: Left Heart Cath and Coronary Angiography;  Surgeon: Runell Gess, MD;  Location: [facility] INVASIVE CV LAB;  Service: Cardiovascular;  Laterality: N/A;   LIPOMA EXCISION  08/2005   occipital lipoma 1.5cm - by Dr. Maryagnes Amos   LITHOTRIPSY  ~ 2000   MAZE Left 09/20/10   for paroxysmal atrial fibrillation (Dr. Donata Clay)   MITRAL VALVE REPLACEMENT  09/20/10    with a 27-mm pericardial porcine valve (Medtronic Mosaic valve, serial #[id number]). 09/20/10, Dr Donata Clay   ORIF CLAVICLE FRACTURE Right 01/2004   by Veverly Fells. Ophelia Charter, M.D for Right clavicle nonunion.; "it's got a pin in it"   POLYPECTOMY  05/29/2020   Procedure: POLYPECTOMY;  Surgeon: Vida Rigger, MD;  Location: [facility] ENDOSCOPY;  Service: Endoscopy;;   POLYPECTOMY  07/19/2021   Procedure: POLYPECTOMY;  Surgeon: Vida Rigger, MD;  Location: [facility] ENDOSCOPY;  Service: Gastroenterology;;   RADIOLOGY WITH ANESTHESIA Left 09/12/2020   Procedure: RADIOLOGY WITH ANESTHESIA- RENAL CRYOABLATION;  Surgeon: Irish Lack, MD;  Location: [facility] ORS;  Service: Radiology;  Laterality: Left;   REFRACTIVE SURGERY Bilateral    RIGHT HEART CATH N/A 04/23/2016   Procedure: Right Heart Cath;  Surgeon: Laurey Morale, MD;  Location: [facility] INVASIVE  CV LAB;  Service: Cardiovascular;  Laterality: N/A;   TONSILLECTOMY     TUBAL LIGATION     Family History  Problem Relation Age of Onset   Peptic Ulcer Disease Father    Heart attack Father 18       Died  of MI at age 18   Heart attack Brother 4       Died of MI at age 54   Obesity Brother    Pneumonia Mother    Healthy Sister    Lupus Daughter    Obsessive Compulsive Disorder Daughter    Social History   Socioeconomic History   Marital status: Divorced    Spouse name: Not on file   Number of children: Not on file   Years of education: Not on file   Highest education level: Not on file  Occupational History   Not on file  Tobacco Use   Smoking status: Every Day    Current packs/day: 1.00    Average packs/day: 1 pack/day for 60.4 years (60.4 ttl pk-yrs)    Types: Cigarettes    Start date: 11/01/1962   Smokeless tobacco: Never   Tobacco comments:    At least 1 PPD  Vaping Use   Vaping status: Never Used  Substance and Sexual Activity   Alcohol use: Not Currently    Comment: 02/19/2018 "nothing since 1999"   Drug use: Not Currently    Types: Marijuana    Comment: 02/19/2018 "nothing since the 1990s"   Sexual activity: Not Currently    Birth control/protection: Post-menopausal  Other Topics Concern   Not on file  Social History Narrative   L[place] in [place] ([address])   Worked at [employer] for 18 years   No car   Social Drivers of Corporate investment banker Strain: Low Risk  (04/08/2023)   Overall Financial Resource Strain (CARDIA)    Difficulty of Paying Living Expenses: Not hard at all  Food Insecurity: Food Insecurity Present (04/08/2023)   Hunger Vital Sign    Worried About Running Out of Food in the Last Year: Never true    Ran Out of Food in the Last Year: Sometimes true  Transportation Needs: Unmet Transportation Needs (04/08/2023)   PRAPARE - Transportation    Lack of Transportation (Medical): Yes    Lack of Transportation  (Non-Medical): Yes  Physical Activity: Inactive (04/08/2023)   Exercise Vital Sign    Days of Exercise per Week: 0 days    Minutes of Exercise per Session: 0 min  Stress: Stress Concern Present (04/08/2023)   [employer] of Occupational Health - Occupational Stress Questionnaire    Feeling of Stress : Rather much  Social Connections: Socially Isolated (04/08/2023)   Social Connection and Isolation Panel [NHANES]    Frequency of Communication with Friends and Family: More than three times a week    Frequency of Social Gatherings with Friends and Family: More than three times a week    Attends Religious Services: Never    Database administrator or Organizations: No    Attends Banker Meetings: Never    Marital Status: Divorced    Tobacco Counseling Ready to quit: Not Answered Counseling given: Not Answered Tobacco comments: At least 1 PPD    Clinical Intake:  Pre-visit preparation completed: Yes  Pain : 0-10 Pain Score: 8  Pain Type: Neuropathic pain Pain Location: Generalized Pain Orientation: Other (Comment) (ENTIRE BODY) Effect of Pain on Daily Activities: Pain can diminish job performance, lower motivation to exercise and prevent you from completing daily tasks.  Pain produces disability and affects the quality of life.     BMI - recorded: 24.69 Nutritional Status: BMI of 19-24  Normal Nutritional Risks: None Diabetes: Yes CBG done?: No Did pt. bring  in CBG monitor from home?: No  How often do you need to have someone help you when you read instructions, pamphlets, or other written materials from your doctor or pharmacy?: 1 - Never What is the last grade level you completed in school?: HSG  Interpreter Needed?: No  Information entered by :: Susie Cassette, LPN.   Activities of Daily Living     04/08/2023   10:52 AM 12/02/2022   10:35 AM  In your present state of health, do you have any difficulty performing the following activities:   Hearing? 0 0  Vision? 0 0  Difficulty concentrating or making decisions? 0 0  Walking or climbing stairs? 1 1  Dressing or bathing? 0 0  Doing errands, shopping? 0 1  Preparing Food and eating ? N   Using the Toilet? N   In the past six months, have you accidently leaked urine? Y   Comment takes fluid pills; wears Depends everyday x 10 years; has a potty chair in bedroom   Do you have problems with loss of bowel control? N   Comment no; stays constipated   Managing your Medications? N   Managing your Finances? N   Housekeeping or managing your Housekeeping? N     Patient Care Team: Miguel Aschoff, MD as PCP - General (Internal Medicine) Laurey Morale, MD as PCP - Advanced Heart Failure (Cardiology) Plyler, Cecil Cranker, RD as Dietitian (Dietician) Ernesto Rutherford, MD as Consulting Physician (Ophthalmology) Dorena Cookey, MD (Inactive) as Consulting Physician (Gastroenterology) (Rounding), Imts Leitha Bleak, MD as Attending Physician [name], OTR as Occupational Therapist (Occupational Therapy)  Indicate any recent Medical Services you may have received from other than [facility] providers in the past year (date may be approximate).     Assessment:   This is a routine wellness examination for [place].  Hearing/Vision screen Hearing Screening - Comments:: Denies hearing difficulties. No hearing aids.   Vision Screening - Comments:: Wears rx glasses - up to date with routine eye exams with [facility]    Goals Addressed   None    Depression Screen     04/08/2023   10:48 AM 03/12/2023    9:21 AM 07/01/2022   11:07 AM 06/11/2022    9:10 AM 06/03/2022   10:17 AM 05/01/2022    2:27 PM 01/30/2022   12:16 PM  PHQ 2/9 Scores  PHQ - 2 Score 5 5 6 6  0 0   PHQ- 9 Score 14 14 17 14      Exception Documentation       --    Fall Risk     04/08/2023   10:47 AM 03/12/2023    9:17 AM 12/02/2022   10:34 AM 10/09/2022    9:42 AM 09/25/2022    8:52 AM  Fall Risk   Falls in the  past year? 1 1 1 1 1   Number falls in past yr: 1 1 1 1 1   Injury with Fall? 1 1 1 1 1   Risk for fall due to : History of fall(s);Impaired balance/gait;Orthopedic patient History of fall(s);Medication side effect History of fall(s);Impaired balance/gait Impaired balance/gait Impaired balance/gait  Follow up Falls prevention discussed;Falls evaluation completed  Falls evaluation completed Falls evaluation completed Falls evaluation completed    MEDICARE RISK AT HOME:  Medicare Risk at Home Any stairs in or around the home?: No If so, are there any without handrails?: No Home free of loose throw rugs in walkways, pet beds, electrical cords, etc?: Yes  Adequate lighting in your home to reduce risk of falls?: Yes Life alert?: No Use of a cane, walker or w/c?: Yes Grab bars in the bathroom?: Yes Shower chair or bench in shower?: Yes Elevated toilet seat or a handicapped toilet?: Yes  TIMED UP AND GO:  Was the test performed?  No  Cognitive Function: 6CIT completed    04/08/2023   10:52 AM  MMSE - Mini Mental State Exam  Not completed: Unable to complete      03/31/2023    2:36 PM  Montreal Cognitive Assessment   Visuospatial/ Executive (0/5) 5  Naming (0/3) 3  Attention: Read list of digits (0/2) 2  Attention: Read list of letters (0/1) 1  Attention: Serial 7 subtraction starting at 100 (0/3) 3  Language: Repeat phrase (0/2) 1  Language : Fluency (0/1) 0  Abstraction (0/2) 2  Delayed Recall (0/5) 3  Orientation (0/6) 6  Total 26  Adjusted Score (based on education) 27      04/08/2023   10:52 AM 05/01/2022    2:27 PM  6CIT Screen  What Year? 0 points 0 points  What month? 0 points 0 points  What time? 0 points 0 points  Count back from 20 0 points 0 points  Months in reverse 0 points 0 points  Repeat phrase 0 points 0 points  Total Score 0 points 0 points    Immunizations Immunization History  Administered Date(s) Administered   Fluad Quad(high Dose 65+)  11/08/2020, 11/07/2021   Influenza Split 11/04/2010, 11/25/2011   Influenza Whole 11/13/2006, 12/27/2007, 10/23/2008, 11/23/2009   Influenza,inj,Quad PF,6+ Mos 10/06/2012, 10/07/2013, 10/06/2014, 11/16/2015, 11/05/2016, 10/07/2017, 11/15/2018, 10/13/2019   Influenza-Unspecified 10/13/2022   Moderna Sars-Covid-2 Vaccination 04/20/2019, 05/18/2019, 03/01/2020, 09/04/2020   Pfizer Covid-19 Vaccine Bivalent Booster 47yrs & up 01/10/2021   Pneumococcal Conjugate-13 02/17/2014   Pneumococcal Polysaccharide-23 03/26/2009, 08/08/2015   RSV,unspecified 10/13/2022   Td 10/19/2006   Tdap 05/16/2016   Zoster Recombinant(Shingrix) 08/30/2020, 11/29/2020   Zoster, Live 12/03/2011    Screening Tests Health Maintenance  Topic Date Due   COVID-19 Vaccine (6 - 2024-25 season) 10/12/2022   MAMMOGRAM  01/11/2023   HEMOGLOBIN A1C  06/10/2023   Diabetic kidney evaluation - Urine ACR  10/09/2023   FOOT EXAM  10/09/2023   Lung Cancer Screening  10/17/2023   LIPID PANEL  12/02/2023   OPHTHALMOLOGY EXAM  01/13/2024   Diabetic kidney evaluation - eGFR measurement  03/11/2024   Medicare Annual Wellness (AWV)  04/07/2024   DTaP/Tdap/Td (3 - Td or Tdap) 05/17/2026   Pneumonia Vaccine 31+ Years old  Completed   INFLUENZA VACCINE  Completed   DEXA SCAN  Completed   Hepatitis C Screening  Completed   Zoster Vaccines- Shingrix  Completed   HPV VACCINES  Aged Out   Colonoscopy  Discontinued    Health Maintenance  Health Maintenance Due  Topic Date Due   COVID-19 Vaccine (6 - 2024-25 season) 10/12/2022   MAMMOGRAM  01/11/2023   Health Maintenance Items Addressed: Yes Patient was advised that she is due for the following: Covid-19 vaccine and Mammogram. Patient stated that she is scheduled for mammogram for 2025.  Additional Screening:  Vision Screening: Recommended annual ophthalmology exams for early detection of glaucoma and other disorders of the eye.  Dental Screening: Recommended annual dental  exams for proper oral hygiene  Community Resource Referral / Chronic Care Management: CRR required this visit?  No   CCM required this visit?  No     Plan:  I have personally reviewed and noted the following in the patient's chart:   Medical and social history Use of alcohol, tobacco or illicit drugs  Current medications and supplements including opioid prescriptions. Patient is currently taking opioid prescriptions. Information provided to patient regarding non-opioid alternatives. Patient advised to discuss non-opioid treatment plan with their provider. Functional ability and status Nutritional status Physical activity Advanced directives List of other physicians Hospitalizations, surgeries, and ER visits in previous 12 months Vitals Screenings to include cognitive, depression, and falls Referrals and appointments  In addition, I have reviewed and discussed with patient certain preventive protocols, quality metrics, and best practice recommendations. A written personalized care plan for preventive services as well as general preventive health recommendations were provided to patient.     Mickeal Needy, LPN   [id number]   After Visit Summary: (MyChart) Due to this being a telephonic visit, the after visit summary with patients personalized plan was offered to patient via MyChart   Notes: Nothing significant to report at this time.

## 2023-04-08 NOTE — Telephone Encounter (Signed)
 Spoke with patient regarding her mammogram appointment for April 25.2025 @ 11:00 am to arrive 10:45 am. Patient aware to call the breast center [phone]) if unable to keep appt failure to do so there will be a 75.00 no show fee charged. Appointment also mailed to the patient.

## 2023-04-08 NOTE — Patient Instructions (Signed)
 [name] , Thank you for taking time to come for your Medicare Wellness Visit. I appreciate your ongoing commitment to your health goals. Please review the following plan we discussed and let me know if I can assist you in the future.   Referrals/Orders/Follow-Ups/Clinician Recommendations: Yes; Keep maintaining your health by keeping your appointments with Dr. [name] and any specialists that you may see.  Call us if you need anything.  Have a great year!!!!  This is a list of the screening recommended for you and due dates:  Health Maintenance  Topic Date Due   COVID-19 Vaccine (6 - 2024-25 season) 10/12/2022   Mammogram  01/11/2023   Hemoglobin A1C  06/10/2023   Yearly kidney health urinalysis for diabetes  10/09/2023   Complete foot exam   10/09/2023   Screening for Lung Cancer  10/17/2023   Lipid (cholesterol) test  12/02/2023   Eye exam for diabetics  01/13/2024   Yearly kidney function blood test for diabetes  03/11/2024   Medicare Annual Wellness Visit  04/07/2024   DTaP/Tdap/Td vaccine (3 - Td or Tdap) 05/17/2026   Pneumonia Vaccine  Completed   Flu Shot  Completed   DEXA scan (bone density measurement)  Completed   Hepatitis C Screening  Completed   Zoster (Shingles) Vaccine  Completed   HPV Vaccine  Aged Out   Colon Cancer Screening  Discontinued    Advanced directives: (Declined) Advance directive discussed with you today. Even though you declined this today, please call our office should you change your mind, and we can give you the proper paperwork for you to fill out.  Next Medicare Annual Wellness Visit scheduled for next year: Yes

## 2023-04-09 ENCOUNTER — Other Ambulatory Visit (HOSPITAL_COMMUNITY): Payer: Self-pay

## 2023-04-09 ENCOUNTER — Other Ambulatory Visit: Payer: Self-pay

## 2023-04-10 ENCOUNTER — Ambulatory Visit
Admission: RE | Admit: 2023-04-10 | Discharge: 2023-04-10 | Disposition: A | Discharge status: Home / Self Care | Payer: PPO | Source: Ambulatory Visit | Attending: Interventional Radiology | Admitting: Interventional Radiology

## 2023-04-10 DIAGNOSIS — C642 Malignant neoplasm of left kidney, except renal pelvis: Secondary | ICD-10-CM

## 2023-04-10 MED ORDER — IOPAMIDOL (ISOVUE-300) INJECTION 61%
100.0000 mL | Freq: Once | INTRAVENOUS | Status: AC | PRN
  Administered 2023-04-10: 100 mL via INTRAVENOUS

## 2023-04-13 ENCOUNTER — Telehealth: Payer: Self-pay | Admitting: *Deleted

## 2023-04-13 DIAGNOSIS — I480 Paroxysmal atrial fibrillation: Secondary | ICD-10-CM | POA: Diagnosis not present

## 2023-04-13 DIAGNOSIS — Z7901 Long term (current) use of anticoagulants: Secondary | ICD-10-CM | POA: Diagnosis not present

## 2023-04-13 NOTE — Progress Notes (Signed)
 Internal Medicine Attending:  I reviewed the AWV findings of the medical professional who conducted the visit. I was present in the office suite and immediately available to provide assistance and direction throughout the time the service was provided.  I have conducted an independent personal visit with [name].

## 2023-04-13 NOTE — Telephone Encounter (Signed)
 Received INR result from [facility]. 04/13/23 @ 1056AM - INR 2.1

## 2023-04-13 NOTE — Telephone Encounter (Signed)
 Patient provided results of PST FS POC INR value 2.1 (target range 2.0 - 2.5) on 2.5 mg warfarin PO by mouth, once-daily. No bleeding, no new medications, no missed doses, no signs/symtom of embolic. Continue 2.5 mg warfarin daily, repeat PST on 11-MAR-25.

## 2023-04-16 ENCOUNTER — Ambulatory Visit
Admission: RE | Admit: 2023-04-16 | Discharge: 2023-04-16 | Disposition: A | Discharge status: Home / Self Care | Source: Ambulatory Visit | Attending: Interventional Radiology | Admitting: Interventional Radiology

## 2023-04-16 DIAGNOSIS — J449 Chronic obstructive pulmonary disease, unspecified: Secondary | ICD-10-CM | POA: Diagnosis not present

## 2023-04-16 DIAGNOSIS — I270 Primary pulmonary hypertension: Secondary | ICD-10-CM | POA: Diagnosis not present

## 2023-04-16 DIAGNOSIS — I509 Heart failure, unspecified: Secondary | ICD-10-CM | POA: Diagnosis not present

## 2023-04-16 DIAGNOSIS — J439 Emphysema, unspecified: Secondary | ICD-10-CM | POA: Diagnosis not present

## 2023-04-16 DIAGNOSIS — C642 Malignant neoplasm of left kidney, except renal pelvis: Secondary | ICD-10-CM

## 2023-04-16 HISTORY — PX: IR RADIOLOGIST EVAL & MGMT: IMG5224

## 2023-04-16 NOTE — Progress Notes (Signed)
 Chief Complaint: Patient was consulted remotely today (TeleHealth) for follow up after left renal cryoablation.    History of Present Illness: [name] is a 73 y.o. female status post prior cryoablation of a left posterior lower pole clear cell renal carcinoma on 09/19/2011 which demonstrated no evidence of recurrence for 5 years after treatment.  She was recently referred for an enlarging upper pole lesion of the left kidney measuring approximately 1.4 cm and demonstrating partial central enhancement suspicious for a slow-growing renal carcinoma.  This was treated successfully with percutaneous cryoablation on 09/12/2020. Follow up on 12/18/20 and 12/17/21 demonstrated retraction of an ablation defect of the lateral upper pole of the left kidney with no evidence of recurrent carcinoma. She missed follow up imaging last November due to recovery from a left hip fracture with ORIF in September and right wrist fracture in October. She had a follow up CT recently on 04/10/23. She has no abdominal or flank pain. She denies urinary symptoms or hematuria.  Past Medical History:  Diagnosis Date   Abnormality of lung on CXR 02/02/2020   Nonspecific finding on CXR ordered by pulmonologist - c/w inflammation vs infection, f/u imaging suggested, Dr. Roxy Cedar office has been in communication about recommended next steps.   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   Non-bleeding when visualized on capsule endoscopy 06/30/2015    Arthritis    "lower back; hands" (02/19/2018)   Asthma    Asymptomatic cholelithiasis 09/25/2015   Seen on CT scan 08/2015   Carotid artery stenosis; s/p R endarterectomy    s/p right endarterectomy (06/2010) Carotid US (07/2010):  Left: Moderate-to-severe (60-79%) calcific and non-calcific plaque origin and proximal ICA and ECA    Chronic  congestive heart failure with left ventricular diastolic dysfunction (HCC) 10/21/2010   Chronic constipation 02/03/2011   Chronic daily headache 01/16/2014   Chronic iron deficiency anemia    Chronic low back pain 10/06/2012   Chronic venous insufficiency 08/04/2012   Closed fracture of one rib of left side 08/23/2021   ED visit after a fall 08/16/21    COPD (chronic obstructive pulmonary disease) with emphysema (HCC)    PFTs 2018: severe obstructive disease, insignif response to bronchiodilator, mild restriction parenchymal pattern, moderately severe diffusion defect. 2014  FEV1 0.92 (40%), ratio 69, 27% increase in FEV1 with BD, TLC 91%, severe airtrapping, DLCO49% On chronic home O2. Pulmonary rehab referral 05/2012    Dyspnea    Fibromyalgia 08/29/2010   Gastroesophageal reflux disease    History of blood transfusion    "several times"  (02/19/2018)   History of clear cell renal cell carcinoma (HCC), in remission 07/21/2011   s/p cryoablation of left RCC in 09/2011 by Dr. Fredia Sorrow. Followed by Dr. Retta Diones  [facility]) .     History of fracture of left hip 10/17/2022   History of hiatal hernia    History of mitral valve replacement with bioprosthetic valve due to mitral stenosis 2012   s/p MVR with a 27-mm pericardial porcine valve (Medtronic Mosaic valve, serial #[id number] on 09/20/10, Dr. Donata Clay)    History of obstructive sleep apnea, resolved 2013   resolved per sleep study 07/2019; no apnea, but did have desaturation.  CPAP no longer necessary.  Nocturnal polysomnography (06/2009): Moderate sleep apnea/ hypopnea syndrome , AHI 17.8 per hour with nonpositional hypopneas. CPAP titration to 12 CWP, AHI 2.4 per hour.  On nocturnal CPAP via a small resMed Quattro full-face mask with heated humidifier.    History of pneumonia    "once"  (02/19/2018)   History of seborrheic keratosis 09/28/2015   History of stroke without residual deficits 01/17/2022   Hyperlipidemia LDL goal < 100  11/20/2005   Internal and external hemorrhoids without complication 08/04/2012   Lesion of left native kidney 06/01/2020   Incidental finding on recent screening chest CT for lung ca ordered by Dr. Maple Hudson pulmonology - he has ordered a f/u renal U/S.    Lichen sclerosus of female genitalia 01/12/2017   Migraine    "none in years" (02/19/2018)   Mild cognitive impairment with memory loss 12/23/2021   Moderately severe major depression (HCC) 11/19/2005   Nocturnal hypoxia per sleep study 07/2019    Osteoporosis    DEXA 2016: T -2.7; DEXA (12/09/2011): L-spine T -3.7, left hip T -1.4 DEXA (12/2004): L-spine T -2.6, left hip -0.1    Paroxysmal atrial fibrillation (HCC) 10/22/2010   s/p Left atrial maze procedure for paroxysmal atrial fibrillation on 09/20/2010 by Dr Donata Clay.  Subsequent splenic infarct, decision was made to re-anticoagulate with coumadin, likely life-long as this is the most likely cause of the splenic infarct.    Personal history of colonic polyps 05/14/2011   Colonoscopy (05/2011): 4 mm adenomatous polyp excised endoscopically Colonoscopy (02/2002): Adenomatous polyp excised endoscopically    Personal history of renal cell carcinoma 09/12/2020   Pneumonia    Pulmonary hypertension due to chronic obstructive pulmonary disease (HCC) 04/25/2016   2014 TEE w PA peak pressure 46 mmHg, s/p MV replacement    Right nephrolithiasis, asymptomatic, incidental finding 09/06/2014   5 mm non-obstructing calculus seen on CT scan 09/05/2014    Right ventricular failure (HCC) 04/25/2016   Severe obesity (BMI 35.0-39.9) with comorbidity (HCC) 10/23/2011   Sleep apnea    Stage 2 chronic kidney disease due to type 2 diabetes mellitus (HCC) 12/16/2018   CKD stage III     Tobacco abuse 07/28/2012   Type 2 diabetes mellitus with diabetic neuropathy [facility])     Past Surgical History:  Procedure Laterality Date   BIOPSY  07/19/2021   Procedure: BIOPSY;  Surgeon: Vida Rigger, MD;  Location: [facility]  ENDOSCOPY;  Service: Gastroenterology;;   CARDIAC CATHETERIZATION     CARDIAC VALVE REPLACEMENT  Aug. 2012   "mitral valve"   CAROTID ENDARTERECTOMY Right 07/04/2010   by Dr. Myra Gianotti for asymptomatic right carotid artery stenosis   CATARACT EXTRACTION W/ INTRAOCULAR LENS  IMPLANT, BILATERAL Bilateral    CHEST TUBE INSERTION  09/24/2010   Dr Zenaida Niece Trigt   COLONOSCOPY  05/12/2011   performed by Dr. Bosie Clos. Showing small internal hemorrhoids, single tubular adenoma polyp   COLONOSCOPY WITH PROPOFOL N/A 05/29/2020   Procedure: COLONOSCOPY WITH PROPOFOL;  Surgeon: Vida Rigger, MD;  Location: [facility] ENDOSCOPY;  Service: Endoscopy;  Laterality: N/A;   COLONOSCOPY WITH PROPOFOL N/A 07/19/2021   Procedure: COLONOSCOPY WITH PROPOFOL;  Surgeon: Vida Rigger, MD;  Location: [facility] ENDOSCOPY;  Service: Gastroenterology;  Laterality: N/A;   CRYOABLATION Left 09/2011   by Dr. Fredia Sorrow. Followed by Dr. Retta Diones  [facility]) .     DILATION AND CURETTAGE OF UTERUS     ESOPHAGOGASTRODUODENOSCOPY  05/12/2011   performed by Dr. Bosie Clos. Negative for ulcerations, biopsy negative for evidence of celiac sprue   ESOPHAGOGASTRODUODENOSCOPY N/A 06/29/2015   Procedure: ESOPHAGOGASTRODUODENOSCOPY (EGD);  Surgeon: Vida Rigger, MD;  Location: [facility] ENDOSCOPY;  Service: Endoscopy;  Laterality: N/A;  ESOPHAGOGASTRODUODENOSCOPY N/A 03/29/2016   Procedure: ESOPHAGOGASTRODUODENOSCOPY (EGD);  Surgeon: Vida Rigger, MD;  Location: [facility] ENDOSCOPY;  Service: Endoscopy;  Laterality: N/A;   ESOPHAGOGASTRODUODENOSCOPY (EGD) WITH PROPOFOL N/A 05/29/2020   Procedure: ESOPHAGOGASTRODUODENOSCOPY (EGD) WITH PROPOFOL;  Surgeon: Vida Rigger, MD;  Location: [facility] ENDOSCOPY;  Service: Endoscopy;  Laterality: N/A;   FRACTURE SURGERY     GIVENS CAPSULE STUDY N/A 06/30/2015   Procedure: GIVENS CAPSULE STUDY;  Surgeon: Vida Rigger, MD;  Location: [facility] ENDOSCOPY;  Service: Endoscopy;  Laterality: N/A;   GIVENS CAPSULE STUDY N/A 06/29/2015   Procedure: GIVENS CAPSULE  STUDY;  Surgeon: Vida Rigger, MD;  Location: [facility] ENDOSCOPY;  Service: Endoscopy;  Laterality: N/A;   HEMORRHOID SURGERY  1970s?   "lanced"   HYSTEROSCOPY W/ ENDOMETRIAL ABLATION  06/2001   for persistent post-menopausal bleeding // by S. Kyra Manges, M.D.   INTRAMEDULLARY (IM) NAIL INTERTROCHANTERIC Left 10/19/2022   Procedure: LEFT INTRAMEDULLARY (IM) NAIL INTERTROCHANTERIC;  Surgeon: Luci Bank, MD;  Location: [facility] OR;  Service: Orthopedics;  Laterality: Left;   IR GENERIC HISTORICAL  08/23/2015   IR RADIOLOGIST EVAL & MGMT 08/23/2015 Irish Lack, MD [facility] INTERV RAD   IR GENERIC HISTORICAL  04/09/2016   IR RADIOLOGIST EVAL & MGMT 04/09/2016 Irish Lack, MD [facility] INTERV RAD   IR RADIOLOGIST EVAL & MGMT  10/07/2016   IR RADIOLOGIST EVAL & MGMT  06/25/2017   IR RADIOLOGIST EVAL & MGMT  07/31/2020   IR RADIOLOGIST EVAL & MGMT  10/02/2020   IR RADIOLOGIST EVAL & MGMT  12/20/2020   IR RADIOLOGIST EVAL & MGMT  12/25/2021   LEFT HEART CATH AND CORONARY ANGIOGRAPHY N/A 04/21/2016   Procedure: Left Heart Cath and Coronary Angiography;  Surgeon: Runell Gess, MD;  Location: [facility] INVASIVE CV LAB;  Service: Cardiovascular;  Laterality: N/A;   LIPOMA EXCISION  08/2005   occipital lipoma 1.5cm - by Dr. Maryagnes Amos   LITHOTRIPSY  ~ 2000   MAZE Left 09/20/10   for paroxysmal atrial fibrillation (Dr. Donata Clay)   MITRAL VALVE REPLACEMENT  09/20/10    with a 27-mm pericardial porcine valve (Medtronic Mosaic valve, serial #[id number]). 09/20/10, Dr Donata Clay   ORIF CLAVICLE FRACTURE Right 01/2004   by Veverly Fells. Ophelia Charter, M.D for Right clavicle nonunion.; "it's got a pin in it"   POLYPECTOMY  05/29/2020   Procedure: POLYPECTOMY;  Surgeon: Vida Rigger, MD;  Location: [facility] ENDOSCOPY;  Service: Endoscopy;;   POLYPECTOMY  07/19/2021   Procedure: POLYPECTOMY;  Surgeon: Vida Rigger, MD;  Location: [facility] ENDOSCOPY;  Service: Gastroenterology;;   RADIOLOGY WITH ANESTHESIA Left 09/12/2020   Procedure: RADIOLOGY WITH ANESTHESIA-  RENAL CRYOABLATION;  Surgeon: Irish Lack, MD;  Location: [facility] ORS;  Service: Radiology;  Laterality: Left;   REFRACTIVE SURGERY Bilateral    RIGHT HEART CATH N/A 04/23/2016   Procedure: Right Heart Cath;  Surgeon: Laurey Morale, MD;  Location: [facility] INVASIVE CV LAB;  Service: Cardiovascular;  Laterality: N/A;   TONSILLECTOMY     TUBAL LIGATION      Allergies: Dexilant [dexlansoprazole], Isovue [iopamidol], Lorazepam, Oxycontin [oxycodone], and Tramadol hcl  Medications: Prior to Admission medications   Medication Sig Start Date End Date Taking? Authorizing Provider  albuterol (VENTOLIN HFA) 108 (90 Base) MCG/ACT inhaler Inhale 2 puffs into the lungs every 6 hours as needed for shortness of breath 01/21/23   Miguel Aschoff, MD  ALPRAZolam Prudy Feeler) 0.5 MG tablet Take 1 tablet (0.5 mg total) by mouth at bedtime. To help with sleep. 04/01/23  Miguel Aschoff, MD  Blood Glucose Monitoring Suppl (ONETOUCH VERIO FLEX SYSTEM) w/Device KIT Use to check blood sugar up to 7 times a week Patient not taking: Reported on 03/31/2023 03/31/23   Miguel Aschoff, MD  buPROPion (WELLBUTRIN XL) 150 MG 24 hr tablet Take 1 tablet (150 mg total) by mouth every morning. 12/24/22   Miguel Aschoff, MD  Continuous Glucose Sensor (FREESTYLE LIBRE 3 SENSOR) MISC Place 1 sensor on the skin every 14 days. Use to check glucose 6 times daily as directed 01/02/23   Miguel Aschoff, MD  Fluticasone-Umeclidin-Vilant (TRELEGY ELLIPTA) 100-62.5-25 MCG/ACT AEPB Inhale 1 puff into the lungs daily. 03/22/23   Oretha Milch, MD  furosemide (LASIX) 40 MG tablet Take 0.5 tablets (20 mg total) by mouth daily. 04/07/23   Miguel Aschoff, MD  gabapentin (NEURONTIN) 600 MG tablet Take 1 tablet (600 mg total) by mouth at bedtime. 01/06/23   Miguel Aschoff, MD  glucose blood (ACCU-CHEK GUIDE TEST) test strip Use to check blood sugar up to 7 times a week Patient not taking: Reported on 03/31/2023 03/31/23    Miguel Aschoff, MD  HYDROcodone-acetaminophen (NORCO/VICODIN) 5-325 MG tablet Take 2 tablets by mouth 2 (two) times daily. 04/01/23   Miguel Aschoff, MD  ipratropium (ATROVENT) 0.03 % nasal spray Instill 2 sprays into the nose 3 (three) times daily as directed 02/18/23   Miguel Aschoff, MD  ipratropium-albuterol (DUONEB) 0.5-2.5 (3) MG/3ML SOLN Inhale 3 mLs by nebulization every 6 (six) hours as needed (shortness of breath, wheezing). 04/03/22   Miguel Aschoff, MD  ketoconazole (NIZORAL) 2 % cream Apply 1 application topically at bedtime as needed for irritation. 09/08/22   [name], RPH-CPP  Lancets (ONETOUCH DELICA PLUS [place]) MISC Use to check blood sugar up to 7 times a week Patient not taking: Reported on 03/31/2023 03/31/23   Miguel Aschoff, MD  lidocaine-prilocaine (EMLA) cream Apply 1 Application topically as needed. 09/01/22   McCaughan, Dia D, DPM  linaclotide (LINZESS) 72 MCG capsule Take 1 capsule (72 mcg total) by mouth daily before breakfast. 12/15/22 12/10/23  Miguel Aschoff, MD  metoprolol succinate (TOPROL XL) 25 MG 24 hr tablet Take 1 tablet (25 mg total) by mouth daily. 04/07/23   Miguel Aschoff, MD  omeprazole (PRILOSEC) 40 MG capsule Take 1 capsule (40 mg total) by mouth 2 (two) times daily. 01/29/23   Miguel Aschoff, MD  ondansetron (ZOFRAN) 4 MG tablet Take 1 tablet (4 mg total) by mouth daily as needed for nausea or vomiting. 11/19/22 11/19/23  Kathleen Lime, MD  OXYGEN Inhale 2 L into the lungs at bedtime. Patient not taking: Reported on 03/31/2023    [provider]  potassium chloride (KLOR-CON M) 10 MEQ tablet Take 1 tablet (10 mEq total) by mouth daily. 03/12/23   Miguel Aschoff, MD  ramelteon (ROZEREM) 8 MG tablet Take 1 tablet (8 mg total) by mouth at bedtime. 01/29/23 01/29/24  Miguel Aschoff, MD  rosuvastatin (CRESTOR) 20 MG tablet Take 1 tablet (20 mg total) by mouth at bedtime. 03/19/23 03/13/24   Miguel Aschoff, MD  venlafaxine XR (EFFEXOR-XR) 150 MG 24 hr capsule Take 1 capsule (150 mg total) by mouth daily with breakfast. 04/07/23   Miguel Aschoff, MD  warfarin (COUMADIN) 2.5 MG tablet Take 1 tablet (2.5 mg total) by mouth daily unless otherwise instructed 12/23/22   Miguel Aschoff, MD     Family History  Problem Relation Age of Onset   Peptic Ulcer Disease Father    Heart attack Father 70       Died of MI at age 63   Heart attack Brother 89       Died of MI at age 37   Obesity Brother    Pneumonia Mother    Healthy Sister    Lupus Daughter    Obsessive Compulsive Disorder Daughter     Social History   Socioeconomic History   Marital status: Divorced    Spouse name: Not on file   Number of children: Not on file   Years of education: Not on file   Highest education level: Not on file  Occupational History   Not on file  Tobacco Use   Smoking status: Every Day    Current packs/day: 1.00    Average packs/day: 1 pack/day for 60.5 years (60.5 ttl pk-yrs)    Types: Cigarettes    Start date: 11/01/1962   Smokeless tobacco: Never   Tobacco comments:    At least 1 PPD  Vaping Use   Vaping status: Never Used  Substance and Sexual Activity   Alcohol use: Not Currently    Comment: 02/19/2018 "nothing since 1999"   Drug use: Not Currently    Types: Marijuana    Comment: 02/19/2018 "nothing since the 1990s"   Sexual activity: Not Currently    Birth control/protection: Post-menopausal  Other Topics Concern   Not on file  Social History Narrative   Lives alone in [place] ([address])   Worked at [employer] for 18 years   No car   Social Drivers of Corporate investment banker Strain: Low Risk  (04/08/2023)   Overall Financial Resource Strain (CARDIA)    Difficulty of Paying Living Expenses: Not hard at all  Food Insecurity: Food Insecurity Present (04/08/2023)   Hunger Vital Sign    Worried About Running Out of Food in the Last Year:  Never true    Ran Out of Food in the Last Year: Sometimes true  Transportation Needs: Unmet Transportation Needs (04/08/2023)   PRAPARE - Transportation    Lack of Transportation (Medical): Yes    Lack of Transportation (Non-Medical): Yes  Physical Activity: Inactive (04/08/2023)   Exercise Vital Sign    Days of Exercise per Week: 0 days    Minutes of Exercise per Session: 0 min  Stress: Stress Concern Present (04/08/2023)   [employer] of Occupational Health - Occupational Stress Questionnaire    Feeling of Stress : Rather much  Social Connections: Socially Isolated (04/08/2023)   Social Connection and Isolation Panel [NHANES]    Frequency of Communication with Friends and Family: More than three times a week    Frequency of Social Gatherings with Friends and Family: More than three times a week    Attends Religious Services: Never    Database administrator or Organizations: No    Attends Banker Meetings: Never    Marital Status: Divorced    ECOG Status: 0 - Asymptomatic  Review of Systems  Constitutional: Negative.   Respiratory: Negative.    Cardiovascular: Negative.   Gastrointestinal: Negative.   Genitourinary: Negative.   Musculoskeletal:  Positive for arthralgias and back pain.  Neurological: Negative.     Review of Systems: A 12 point ROS discussed and pertinent positives are indicated in the HPI above.  All other systems are negative.   Physical Exam No direct physical exam was performed (except  for noted visual exam findings with Video Visits).   Vital Signs: There were no vitals taken for this visit.  Imaging: CT ABDOMEN W WO CONTRAST Result Date: 04/15/2023 CLINICAL DATA:  Follow-up renal cancer status post left renal cryoablation 09/22/2020 EXAM: CT ABDOMEN WITHOUT AND WITH CONTRAST TECHNIQUE: Multidetector CT imaging of the abdomen was performed following the standard protocol before and following the bolus administration of intravenous  contrast. RADIATION DOSE REDUCTION: This exam was performed according to the departmental dose-optimization program which includes automated exposure control, adjustment of the mA and/or kV according to patient size and/or use of iterative reconstruction technique. CONTRAST:  ISOVUE-300 IOPAMIDOL (ISOVUE-300) INJECTION 61% COMPARISON:  CT abdomen and pelvis October 17, 2022 FINDINGS: Lower chest: No infiltrates or consolidations, no pleural effusions Hepatobiliary: Liver normal size no masses no biliary dilatation. Small gallstones correlate with chronic cholelithiasis. Findings are stable since prior examination 2024 Pancreas: Pancreas normal size. No masses calcifications or inflammatory changes. Spleen: Spleen normal size.  No masses. Adrenals/Urinary Tract: Comparison with prior examinations demonstrates no significant change in the prominent thickened adrenal glands left more than right. May correlate with some degree of adrenal hyperplasia but no obvious masses. Comparison with prior examinations demonstrates no significant change in the cryoablation defect of the left upper pole. No masses. Comparison with prior examinations demonstrates no significant change stable calcified lesion of the left kidney posterior cortical margin lower pole measuring 2.8 x 2.7 cm. No other significant renal masses. Following the intravenous administration of contrast material there is prompt bilateral symmetric nephrograms with symmetric excretion of contrast in the pelvicalyceal system and ureters. There is no persistent intraluminal filling defects or evidence of papillary necrosis. Both ureters are normal size and trajectory. Stomach/Bowel: No small or large bowel obstruction or inflammatory changes. Moderate amount of residual fecal material throughout the colon without obstruction or constipation. Vascular/Lymphatic: Calcifications of the abdominal aorta without aneurysm. Other: Anterior abdominal wall unremarkable  without evidence of umbilical or inguinal hernias Musculoskeletal: Visualized portion of the thoracolumbar spine and pelvic structures grossly unremarkable without evidence of fracture bony abnormalities or soft tissue masses. IMPRESSION: *No significant change in the cryoablation defect of the left upper pole. *No significant change in the calcified lesion of the left kidney posterior cortical margin lower pole measuring 2.8 x 2.7 cm. *No significant change in the prominent thickened adrenal glands left more than right. May correlate with some degree of adrenal hyperplasia but no obvious masses. *Chronic cholelithiasis. *Aortic atherosclerosis. Electronically Signed   By: Shaaron Adler M.D.   On: 04/15/2023 11:00    Labs:  CBC: Recent Labs    11/19/22 1724 12/18/22 1141 03/12/23 1048 04/07/23 1020  WBC 6.2 7.9 8.3 6.7  HGB 10.2* 10.7* 10.6* 9.7*  HCT 35.5* 36.7 37.4 34.0  PLT 277 246 226 201    COAGS: Recent Labs    10/22/22 0434 11/16/22 1509 11/19/22 1724 12/23/22 1520  INR 1.1 2.4* 1.5* 3.8*    BMP: Recent Labs    10/22/22 0434 11/16/22 1509 11/19/22 1724 12/02/22 1206 12/18/22 1141 03/12/23 1048  NA 137 136 139 140 139 141  K 4.3 3.9 3.4* 4.0 4.6 4.9  CL 101 95* 105 98 102 101  CO2 28 28 23 25  32 25  GLUCOSE 189* 197* 225* 242* 87 193*  BUN 20 15 9 13 13 14   CALCIUM 8.9 9.2 9.4 9.8 10.0 9.7  CREATININE 0.84 1.13* 0.83 0.78 0.94 0.98  GFRNONAA >60 51* >60  --  >60  --  LIVER FUNCTION TESTS: Recent Labs    10/17/22 1150 10/20/22 1005 11/16/22 1509 11/19/22 1724 12/18/22 1141  BILITOT 0.8  --  0.9 0.9 0.5  AST 15  --  15 13* 13*  ALT 14  --  13 11 8   ALKPHOS 62  --  91 86 69  PROT 6.0*  --  6.4* 5.9* 6.8  ALBUMIN 3.2* 2.8* 3.2* 2.9* 4.0     Assessment and Plan:  I spoke with [name] over the phone. The follow up CT on 04/10/23 demonstrates a stable scalloped ablation defect at the lateral left upper pole cortex with no evidence of recurrent  enhancing tumor or complication. The old calcified left lower pole ablation defect is stable without evidence of enhancement. I told her that we could perform another CT in late 2026, at which time she will be 4 years post her most recent ablation.    Electronically Signed: Reola Calkins 04/16/2023, 3:51 PM    I spent a total of 10 Minutes in remote  clinical consultation, greater than 50% of which was counseling/coordinating care post cryoablation of a left renal neoplasm.    Visit type: Audio only (telephone). Audio (no video) only due to patient's lack of internet/smartphone capability. Alternative for in-person consultation at [facility], [address] [place], [place], [place]. This visit type was conducted due to national recommendations for restrictions regarding the COVID-19 Pandemic (e.g. social distancing).  This format is felt to be most appropriate for this patient at this time.  All issues noted in this document were discussed and addressed.

## 2023-04-21 ENCOUNTER — Telehealth: Payer: Self-pay | Admitting: *Deleted

## 2023-04-21 NOTE — Telephone Encounter (Signed)
 Received fax from [facility]:   INR 3.2 - 04/21/23 @ 1118AM.

## 2023-04-21 NOTE — Telephone Encounter (Signed)
 INTERNAL MEDICINE TEACHING ATTENDING ADDENDUM   I agree with these recommendations regarding anticoagulation management.   Charissa Bash, MD

## 2023-04-21 NOTE — Telephone Encounter (Signed)
 Pt reports PST FS POC INR results of 3.2 (target range 2.0 - 2.5) on 2.5mg  warfarin qd for past 8 days. Will decrease ~ 15% to 15 mg total/wk as: 1/2 x 2.5 mg (1.25 mg dose) on Tu/Th; 2.5 mg all other days. Repeat INR in six days, 17-MAR-25. No bleeding.

## 2023-04-26 IMAGING — CT CT ABDOMEN WO/W CM
5 of 15 series · 12 of 46 positions shown, 17 images · IV contrast (omnipaque)
Comparison: CT-guided left renal cryoablation, 09/12/2020, MR
abdomen, 06/24/2020

CLINICAL DATA: Follow-up left renal cryoablation

EXAM:
CT ABDOMEN WITHOUT AND WITH CONTRAST
TECHNIQUE: Multidetector CT imaging of the abdomen was performed following the
standard protocol before and following the bolus administration of
intravenous contrast.
CONTRAST:  80mL OMNIPAQUE IOHEXOL 350 MG/ML SOLN

[Series 2: axial pre · axial · non-contrast · 0.82mm/px · z∈[+1320,+1410]mm · 2 of 54 slices shown]
[im 18/54  soft-tissue]
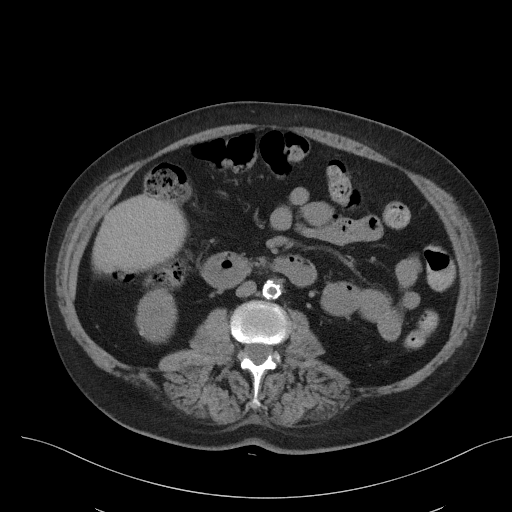
[im 36/54  soft-tissue]
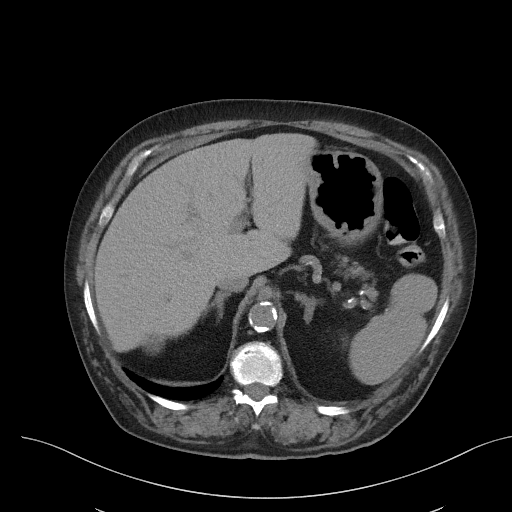

[Series 4: coronal pre · coronal · non-contrast · 0.53mm/px · 1 of 101 slices shown, 2 images]
[im 51/101  soft-tissue]
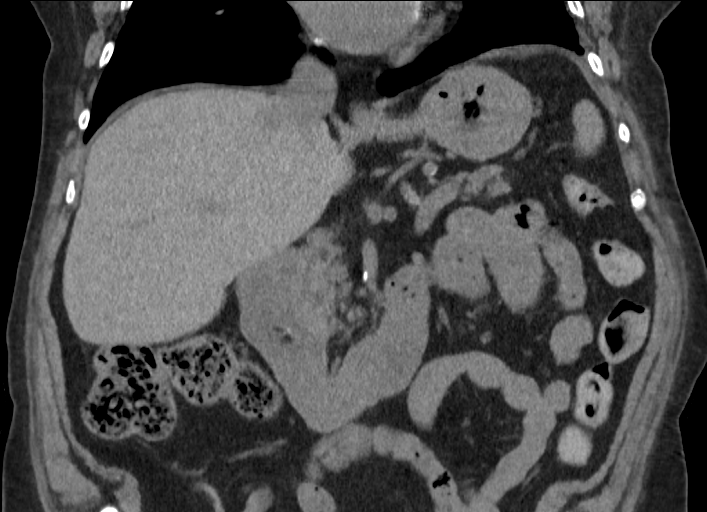
[im 51/101  bone]
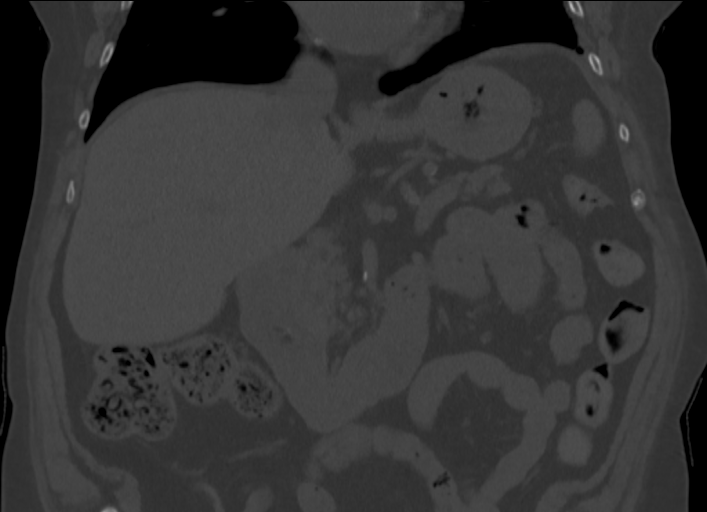

[Series 6: axial arterial · axial · arterial · 0.77mm/px · z∈[+1299,+1431]mm · 3 of 89 slices shown, 7 images]
[im 23/89  soft-tissue]
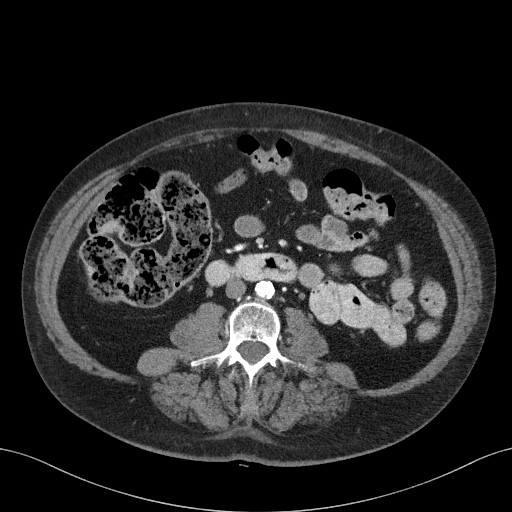
[im 23/89  lung]
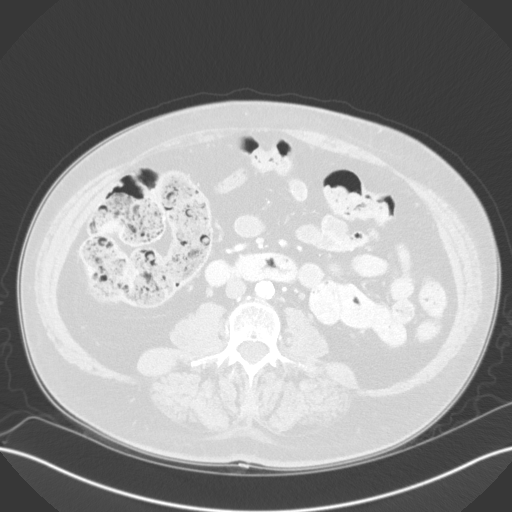
[im 23/89  bone]
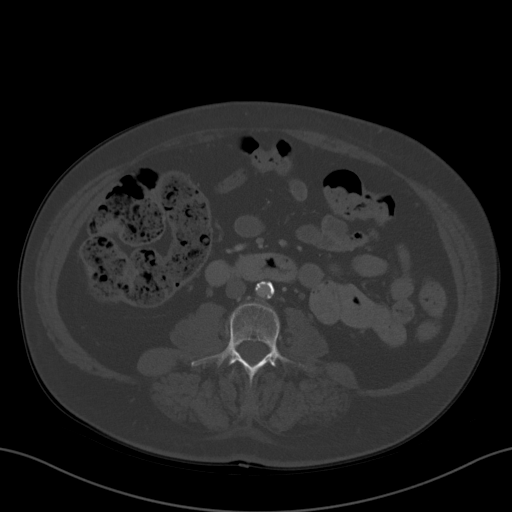
[im 45/89  soft-tissue]
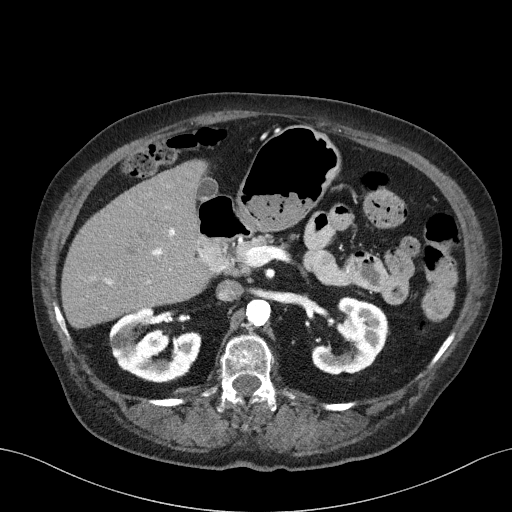
[im 45/89  lung]
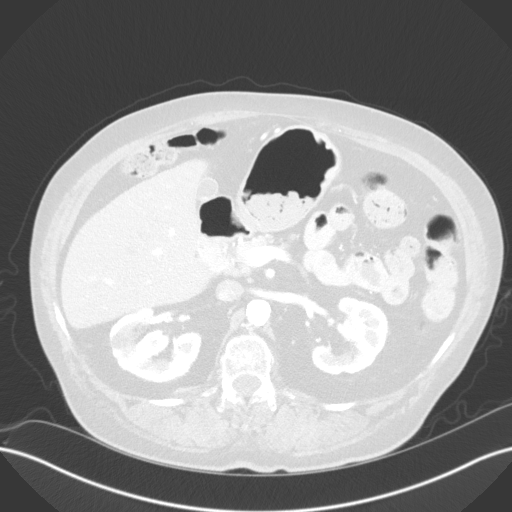
[im 67/89  soft-tissue]
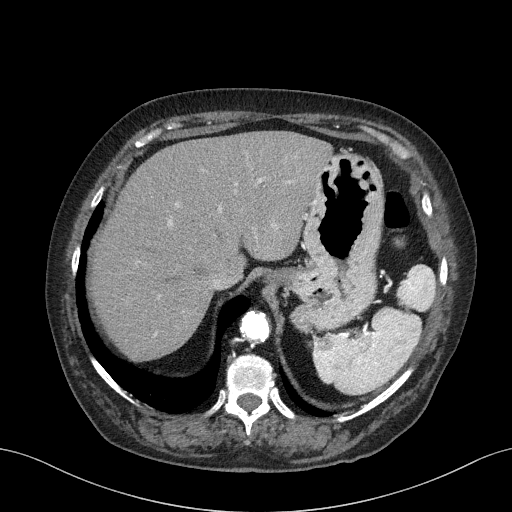
[im 67/89  lung]
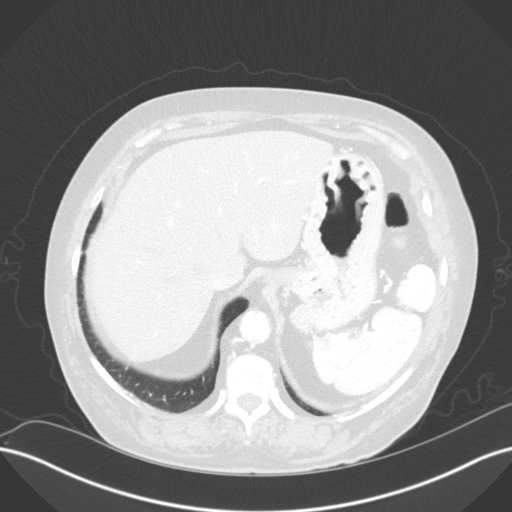

[Series 11: axial nephro · axial · 0.77mm/px · z∈[+1299,+1431]mm · 3 of 89 slices shown]
[im 23/89  soft-tissue]
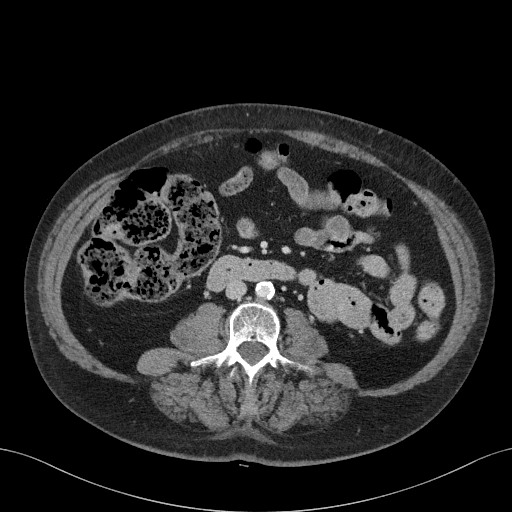
[im 45/89  soft-tissue]
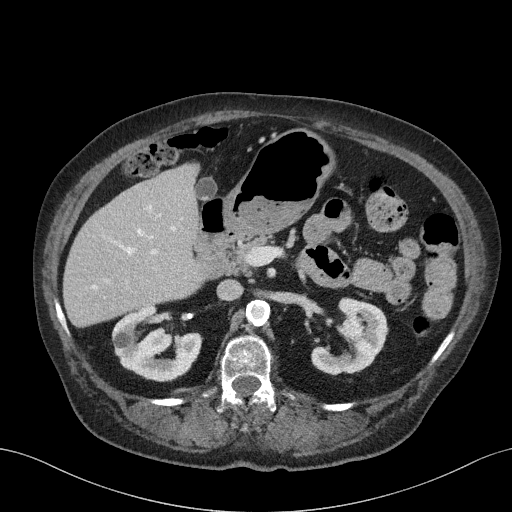
[im 67/89  soft-tissue]
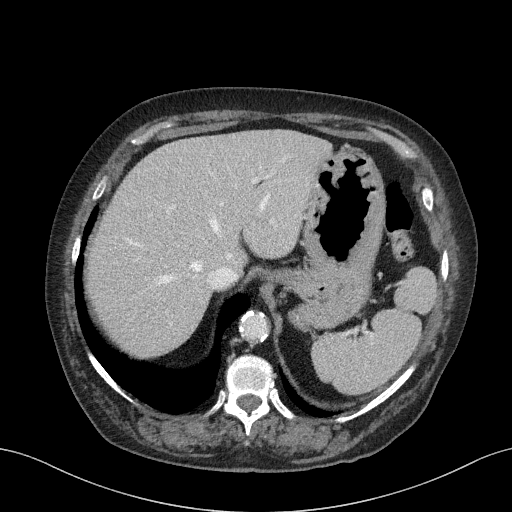

[Series 16: renal delay · axial · delayed · 0.77mm/px · z∈[+1302,+1431]mm · 3 of 87 slices shown]
[im 22/87  soft-tissue]
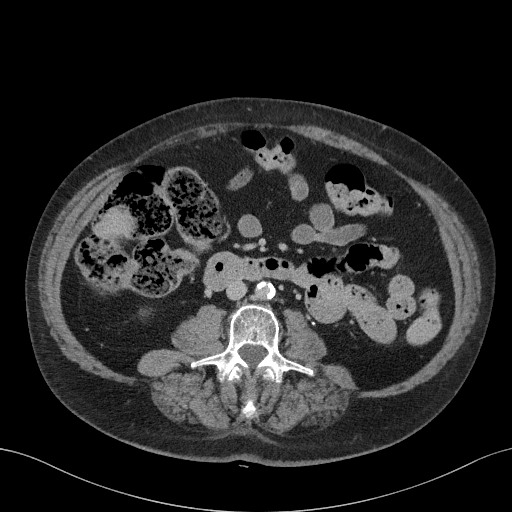
[im 44/87  soft-tissue]
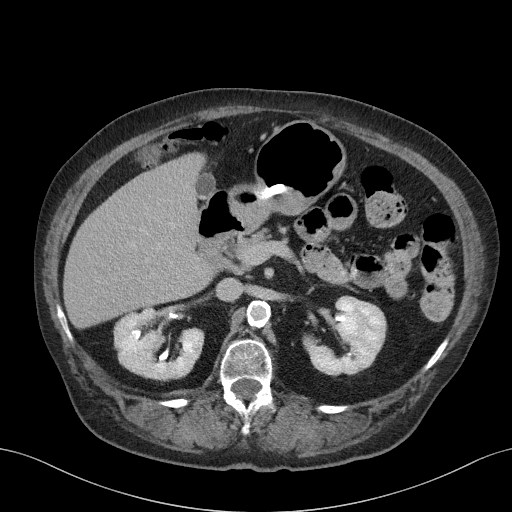
[im 65/87  soft-tissue]
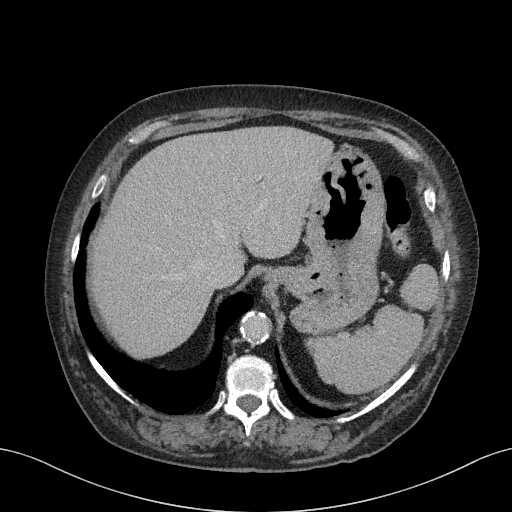

[12 of 46 positions shown; findings below may reference images not displayed]

FINDINGS: Lower chest: No acute abnormality.

Hepatobiliary: No focal liver abnormality is seen. Tiny gallstones
in the gallbladder. No gallbladder wall thickening, or biliary
dilatation.

Pancreas: Unremarkable. No pancreatic ductal dilatation or
surrounding inflammatory changes.

Spleen: Normal in size without focal abnormality.

Adrenals/Urinary Tract: Adrenal glands are unremarkable. There is no
residual contrast enhancement of an ablated lesion of the peripheral
superior pole of the left kidney (series 6, image 34). Additional
calcified more chronic ablation lesion of the posterior inferior
pole of the left kidney (series 6, image 52). Small bilateral
nonenhancing renal cysts. Small nonobstructive calculi of the
inferior poles. No hydronephrosis.

Stomach/Bowel: Stomach is within normal limits. Appendix appears
normal. No evidence of bowel wall thickening, distention, or
inflammatory changes.

Vascular/Lymphatic: Aortic atherosclerosis. No enlarged abdominal
lymph nodes.

Other: No abdominal wall hernia or abnormality.

Musculoskeletal: No fracture is seen.
IMPRESSION: 1. There is no residual contrast enhancement of an ablated lesion of
the peripheral superior pole of the left kidney. No evidence of
residual or recurrent contrast enhancement, lymphadenopathy, or
metastatic disease in the abdomen.
2. Additional calcified more chronic ablation lesion of the
posterior inferior pole of the left kidney.
3. Nonobstructive bilateral nephrolithiasis.
4. Cholelithiasis.

Aortic Atherosclerosis ([id number]-499.9).

## 2023-04-28 ENCOUNTER — Ambulatory Visit (INDEPENDENT_AMBULATORY_CARE_PROVIDER_SITE_OTHER): Payer: PPO | Admitting: Internal Medicine

## 2023-04-28 ENCOUNTER — Other Ambulatory Visit (HOSPITAL_COMMUNITY): Payer: Self-pay

## 2023-04-28 VITALS — BP 140/70 | HR 66 | Temp 97.0°F | Ht 62.0 in | Wt 134.0 lb

## 2023-04-28 DIAGNOSIS — F322 Major depressive disorder, single episode, severe without psychotic features: Secondary | ICD-10-CM

## 2023-04-28 DIAGNOSIS — R296 Repeated falls: Secondary | ICD-10-CM

## 2023-04-28 DIAGNOSIS — E7841 Elevated Lipoprotein(a): Secondary | ICD-10-CM

## 2023-04-28 DIAGNOSIS — D509 Iron deficiency anemia, unspecified: Secondary | ICD-10-CM

## 2023-04-28 DIAGNOSIS — R52 Pain, unspecified: Secondary | ICD-10-CM

## 2023-04-28 DIAGNOSIS — I110 Hypertensive heart disease with heart failure: Secondary | ICD-10-CM | POA: Diagnosis not present

## 2023-04-28 DIAGNOSIS — E1151 Type 2 diabetes mellitus with diabetic peripheral angiopathy without gangrene: Secondary | ICD-10-CM

## 2023-04-28 DIAGNOSIS — I5032 Chronic diastolic (congestive) heart failure: Secondary | ICD-10-CM

## 2023-04-28 DIAGNOSIS — E1169 Type 2 diabetes mellitus with other specified complication: Secondary | ICD-10-CM

## 2023-04-28 DIAGNOSIS — I480 Paroxysmal atrial fibrillation: Secondary | ICD-10-CM | POA: Diagnosis not present

## 2023-04-28 DIAGNOSIS — Z794 Long term (current) use of insulin: Secondary | ICD-10-CM

## 2023-04-28 DIAGNOSIS — Z85528 Personal history of other malignant neoplasm of kidney: Secondary | ICD-10-CM

## 2023-04-28 DIAGNOSIS — T50905A Adverse effect of unspecified drugs, medicaments and biological substances, initial encounter: Secondary | ICD-10-CM | POA: Diagnosis not present

## 2023-04-28 DIAGNOSIS — R5381 Other malaise: Secondary | ICD-10-CM

## 2023-04-28 DIAGNOSIS — R634 Abnormal weight loss: Secondary | ICD-10-CM

## 2023-04-28 DIAGNOSIS — I739 Peripheral vascular disease, unspecified: Secondary | ICD-10-CM

## 2023-04-28 DIAGNOSIS — I10 Essential (primary) hypertension: Secondary | ICD-10-CM

## 2023-04-28 DIAGNOSIS — E118 Type 2 diabetes mellitus with unspecified complications: Secondary | ICD-10-CM

## 2023-04-28 MED ORDER — INSULIN DEGLUDEC 100 UNIT/ML ~~LOC~~ SOPN
30.0000 [IU] | PEN_INJECTOR | Freq: Every day | SUBCUTANEOUS | 6 refills | Status: DC
  Filled 2023-04-28: qty 3, 10d supply, fill #0

## 2023-04-28 NOTE — Patient Instructions (Signed)
 [name],  Let's increase your insulin degludec to 30 units every day.  This is still not as high as you once needed, but please let me know if your blood sugar goes too low.  Please stop your crestor (rosuvastatin).  It can cause muscle pain in some people and I'd like to see if your symptoms improve with a medicine "holiday'.  See you in a month!  Dr. Lacretia Nicks.

## 2023-04-28 NOTE — Progress Notes (Unsigned)
 73 y.o. [name] is here for routine follow-up of chronic pain (have changed duloxetine to venlafaxine), iron deficiency anemia (still has PICA, awaiting visit with hematologist for advice on Fe supplement in setting of intolerance to oral), DM2 with recent fluctuations in insulin requirement due to GLP1a-associated weight loss (since stopped; no further wt loss desired), among other problems as listed below.  She's here with her sister [name] today.  Since last visit here 04/07/23 patient has undergone annual surveillance CT for hx of treated RCC; results negative for recurrence.  No other specialty visits in interim.  Needs increase in insulin and application of monitor. Josephine Igo 3 is phasing out by 10/2023. [employer] rollator - 74 yr old unit, has a flat tire? Fell yesterday, hit R side body and head.  Was trying to move box on the floor out of the way with her foot. No LOC and no significant injury Generalized pain, seems worse in legs, but also in arms R wrist still hurts, L hip pain repair pain better, but the other generalized pain persists. Both legs and both arms, which she feels is more in the muscles than in the joints.   Patient Active Problem List   Diagnosis Date Noted   Atrial fibrillation (HCC) 12/23/2022   History of atrial fibrillation 10/18/2022   Chronic anticoagulation 10/18/2022   History of fracture of left hip 10/17/2022   Generalized pain 08/01/2022   Multifactorial functional impairment 02/13/2022   History of stroke without residual deficits 01/17/2022   Diabetic peripheral neuropathy associated with type 2 diabetes mellitus (HCC) 11/11/2021   Recurrent falls 11/11/2021   Gait instability 09/19/2021   Perennial non-allergic rhinitis 09/19/2021   Chronic, continuous use of opioids for chronic pain 07/10/2021   Polypharmacy 11/08/2020   Personal history of renal cell carcinoma 09/12/2020   Tinea pedis of both feet 08/07/2020   Essential tremor  11/17/2019   Nocturnal hypoxia per sleep study 07/2019    Carotid artery stenosis; s/p R endarterectomy    COPD mixed type (HCC)    Arthritis of both hands 07/01/2019   Stage 2 chronic kidney disease due to type 2 diabetes mellitus (HCC) 12/16/2018   Lichen sclerosus of female genitalia 01/12/2017   Mixed stress and urge urinary incontinence 05/27/2016   Pulmonary hypertension due to chronic obstructive pulmonary disease (HCC) 04/25/2016   Asymptomatic cholelithiasis 09/25/2015   Chronic iron deficiency anemia 12/26/2014   Aortic atherosclerosis (HCC) 10/19/2014   Right nephrolithiasis, asymptomatic (incidental finding) 09/06/2014   Headache, chronic intermittent 01/14/2013   Chronic low back pain 10/06/2012   Long term current use of anticoagulant therapy 09/30/2012   Recurrent candidal intertrigo 07/28/2012   Tobacco use disorder 07/28/2012   History of colonic polyps 05/14/2011   Sleep disturbance 05/14/2011   Abdominal wall hernia 05/14/2011   Constipation due to opioid therapy 02/03/2011   Health care maintenance 11/04/2010   PAF (paroxysmal atrial fibrillation) (HCC) 10/22/2010   Chronic diastolic heart failure (HCC) 10/21/2010   Fibromyalgia 08/29/2010   Gastroesophageal reflux disease    Type 2 diabetes mellitus with complication, with long-term current use of insulin (HCC)    Osteoporosis with current pathological fracture with routine healing    Peripheral arterial disease (HCC) 05/27/2010   History of mitral valve replacement with bioprosthetic valve 2012   Essential hypertension 06/19/2008   Hyperlipidemia 11/20/2005   Severe major depression (HCC) 11/19/2005    Current Outpatient Medications:    insulin degludec (TRESIBA) 100 UNIT/ML FlexTouch Pen, Inject 30  Units into the skin at bedtime., Disp: 3 mL, Rfl: 6   albuterol (VENTOLIN HFA) 108 (90 Base) MCG/ACT inhaler, Inhale 2 puffs into the lungs every 6 hours as needed for shortness of breath, Disp: 54 g, Rfl: 10    ALPRAZolam (XANAX) 0.5 MG tablet, Take 1 tablet (0.5 mg total) by mouth at bedtime. To help with sleep., Disp: 30 tablet, Rfl: 0   buPROPion (WELLBUTRIN XL) 150 MG 24 hr tablet, Take 1 tablet (150 mg total) by mouth every morning., Disp: 90 tablet, Rfl: 3   Continuous Glucose Sensor (FREESTYLE LIBRE 3 PLUS SENSOR) MISC, Change sensor every 15 days., Disp: 6 each, Rfl: 3   Continuous Glucose Sensor (FREESTYLE LIBRE 3 SENSOR) MISC, Place 1 sensor on the skin every 14 days. Use to check glucose 6 times daily as directed, Disp: 6 each, Rfl: 3   Fluticasone-Umeclidin-Vilant (TRELEGY ELLIPTA) 100-62.5-25 MCG/ACT AEPB, Inhale 1 puff into the lungs daily., Disp: 180 each, Rfl: 3   furosemide (LASIX) 40 MG tablet, Take 0.5 tablets (20 mg total) by mouth daily., Disp: 90 tablet, Rfl: 3   gabapentin (NEURONTIN) 600 MG tablet, Take 1 tablet (600 mg total) by mouth at bedtime., Disp: 90 tablet, Rfl: 3   HYDROcodone-acetaminophen (NORCO/VICODIN) 5-325 MG tablet, Take 2 tablets by mouth 2 (two) times daily., Disp: 120 tablet, Rfl: 0   ipratropium (ATROVENT) 0.03 % nasal spray, Instill 2 sprays into the nose 3 (three) times daily as directed, Disp: 30 mL, Rfl: 2   ipratropium-albuterol (DUONEB) 0.5-2.5 (3) MG/3ML SOLN, Inhale 3 mLs by nebulization every 6 (six) hours as needed (shortness of breath, wheezing)., Disp: 90 mL, Rfl: 3   ketoconazole (NIZORAL) 2 % cream, Apply 1 application topically at bedtime as needed for irritation., Disp: 60 g, Rfl: 5   linaclotide (LINZESS) 72 MCG capsule, Take 1 capsule (72 mcg total) by mouth daily before breakfast., Disp: 90 capsule, Rfl: 3   metoprolol succinate (TOPROL XL) 25 MG 24 hr tablet, Take 1 tablet (25 mg total) by mouth daily., Disp: 45 tablet, Rfl: 3   omeprazole (PRILOSEC) 40 MG capsule, Take 1 capsule (40 mg total) by mouth 2 (two) times daily., Disp: 60 capsule, Rfl: 5   ondansetron (ZOFRAN) 4 MG tablet, Take 1 tablet (4 mg total) by mouth daily as needed for nausea  or vomiting., Disp: 30 tablet, Rfl: 1   OXYGEN, Inhale 2 L into the lungs at bedtime. (Patient not taking: Reported on 03/31/2023), Disp: , Rfl:    potassium chloride (KLOR-CON M) 10 MEQ tablet, Take 1 tablet (10 mEq total) by mouth daily., Disp: 30 tablet, Rfl: 11   ramelteon (ROZEREM) 8 MG tablet, Take 1 tablet (8 mg total) by mouth at bedtime., Disp: 30 tablet, Rfl: 3   venlafaxine XR (EFFEXOR-XR) 150 MG 24 hr capsule, Take 1 capsule (150 mg total) by mouth daily with breakfast., Disp: 90 capsule, Rfl: 3   warfarin (COUMADIN) 2.5 MG tablet, Take 1 tablet (2.5 mg total) by mouth daily unless otherwise instructed, Disp: 90 tablet, Rfl: 3  Functional Status: Living alone in her home. Independent (out of necessity) in ADLs and IADLs though hasn't driven for some time.  Sister [name], who lives in town, helps with shopping and provides most transportation.  Walks with walker and with rollator.  Minimal physical activity, household distances only, limited by chronic pain and mobility impairment and frequent falls.    Objective BP (!) 140/70 (BP Location: Right Arm, Patient Position: Sitting, Cuff Size: Small)  Pulse 66   Temp (!) 97 F (36.1 C) (Oral)   Ht 5\' 2"  (1.575 m)   Wt 134 lb (60.8 kg)   SpO2 92%   BMI 24.51 kg/m   Exam: Well apparing if not pale, alert.  Cheerful mood. Benign tremor head, hands. Heart irreg irreg, minimal murmur. lUngs with distant heart sounds, few rales L lateral base.  R wrist ulnar styloid tender and very slightly red.  Skin turgor reduced generally.  Muscles generally tender to squeezing above and below knees and over muscles of upper and lower arms. NO knee swelling or tenderness  Problems addressed today (not in particular order):  Type 2 diabetes mellitus with other specified complication, with long-term current use of insulin (HCC) Assessment & Plan: Glucose remains elevated; increase degludec to 30 units and she will continue to wear her CGM (not downloaded  today).  She reminded me that her current unit Josephine Igo 3 will be discontinued in 10/2023.  Orders: -     Insulin Degludec; Inject 30 Units into the skin at bedtime.  Dispense: 3 mL; Refill: 6  Recurrent falls Assessment & Plan: One fall since last visit, mechanical, no injury. Overall frequency of falls has declined after medication adjustments and improvement with mobility with PT.  Peripheral arterial disease [facility]) Assessment & Plan: Vascular tests and f/u upcoming; were rescheduled from earlier due to intercurrent illness and appointments.  PAF (paroxysmal atrial fibrillation) (HCC) Assessment & Plan: Auscultates irregular today, asymptomatic.  Rate controlled.  Anticoagulated.  Generalized pain Assessment & Plan: Venlafaxine (replaces ineffective duloxetine) has helped with mood (she is much brighter at the past couple of appts), though continues to feel pain in legs and in arms; on exam she feels diffuse tenderness with palpation over all muscle groups of arms and legs, not over joints (with exception of her previous R distal ulnar styloid fracture).  Statin myopathy is a possibility; she will trial off the medication and observe for response.    Elevated lipoprotein(a) Assessment & Plan: See entry for generalized pain, stopping statin temporarily to see if her diffuse muscle pain improves.  There had not been an increase in her dose, rosuvastatin 20.  Weight loss due to medication Assessment & Plan: Weight remains stable at 134# after stopping Ozempic.  No further weight loss desired.   Severe major depression (HCC) Assessment & Plan: Tolerating 150 mg daily, mood upbeat today.  It is so nice to see her smile and laugh again!  Multifactorial functional impairment Assessment & Plan: Generally improved with time, therapy, and medication adjustment.  She requests a replacement rollator.   Personal history of renal cell carcinoma Assessment & Plan: Surveillance studies were  negative for recurrence.  Doing well in this regard.  Essential hypertension Assessment & Plan: 140/70 on metoprolol 25 mg and reduced dose of lasix 20 mg daily.  Took meds today, no changes.  She has been on ACEI in the past, stopped as she no longer required it for control.  Now that lasix has been reduced (overly diuresed), there may be room to reintroduce it.  Not pursued today.   Chronic iron deficiency anemia Assessment & Plan: Still with pica and chronic but not worsened exercise intolerance.  She is pale, hands are warm. Upcoming appt with hematologist in April.  I messaged Dr. Candise Che to inquire about anything we might do (labs, orders) in the meantime.   Chronic diastolic heart failure (HCC) Assessment & Plan: Doing well with the reduced (20 mg) of lasix;  no dependent rales, and no LE edema.  Monitor.    Return in about 4 weeks (around 05/26/2023) for chronic condition monitoring, symptom re-check, med review.

## 2023-04-29 ENCOUNTER — Other Ambulatory Visit: Payer: Self-pay | Admitting: Dietician

## 2023-04-29 ENCOUNTER — Encounter: Payer: Self-pay | Admitting: Internal Medicine

## 2023-04-29 ENCOUNTER — Other Ambulatory Visit (HOSPITAL_COMMUNITY): Payer: Self-pay

## 2023-04-29 DIAGNOSIS — E1169 Type 2 diabetes mellitus with other specified complication: Secondary | ICD-10-CM

## 2023-04-29 MED ORDER — FREESTYLE LIBRE 3 PLUS SENSOR MISC
3 refills | Status: AC
  Filled 2023-04-29 – 2023-05-11 (×4): qty 6, 90d supply, fill #0
  Filled 2023-05-12: qty 2, 30d supply, fill #0
  Filled 2023-06-10: qty 2, 30d supply, fill #1
  Filled 2023-07-18: qty 6, 90d supply, fill #1
  Filled 2023-10-13: qty 6, 90d supply, fill #2
  Filled 2024-01-04: qty 6, 90d supply, fill #3

## 2023-04-29 NOTE — Assessment & Plan Note (Signed)
 Weight remains stable at 134# after stopping Ozempic.  No furthe weight loss desired.

## 2023-04-29 NOTE — Assessment & Plan Note (Signed)
 Venlafaxine has helped with mood (she is much brighter at the past couple of appts), though continues to feel pain in legs and in arms; on exam she feels diffuse tenderness with palpation over all muscle groups of arms and legs, not over joints (with exception of her previous R distal ulnar styloid fracture).  Statin myopathy is a possibility; she will trial off the medication and observe for response.

## 2023-04-29 NOTE — Assessment & Plan Note (Signed)
 Surveillance studies were negative for recurrence.  Doing well in this regard.

## 2023-04-29 NOTE — Assessment & Plan Note (Signed)
 Tolerating 150 mg daily, mood upbeat today.  It is so nice to see her smile and laugh again!

## 2023-04-29 NOTE — Telephone Encounter (Signed)
 Wants to switch to [employer] 3 plus.

## 2023-04-29 NOTE — Assessment & Plan Note (Signed)
 Still with pica and chronic but not worsened exercise intolerance.  She is pale, hands are warm. Upcoming appt with hematologist in April.  I messaged Dr. Candise Che to inquire about anything we might do (labs, orders) in the meantime.

## 2023-04-29 NOTE — Assessment & Plan Note (Signed)
 One fall since last visit, mechanical, no injury. Overall frequency of falls has declined after medication adjustments and improvement with mobility with PT.

## 2023-04-29 NOTE — Assessment & Plan Note (Addendum)
 See entry for generalized pain, stopping statin temporarily to see if her diffuse muscle pain improves.  There had not been an increase in her dose, rosuvastatin 20.

## 2023-04-29 NOTE — Assessment & Plan Note (Addendum)
 140/70 on metoprolol 25 mg and reduced dose of lasix 20 mg daily.  Took meds today, no changes.  She has been on ACEI in the past, stopped as she no longer required it for control.  Now that lasix has been reduced (overly diuresed), there may be room to reintroduce it.  Not pursued today.

## 2023-04-29 NOTE — Assessment & Plan Note (Signed)
 Auscultates irregular today, asymptomatic.  Rate controlled.  Anticoagulated.

## 2023-04-29 NOTE — Assessment & Plan Note (Signed)
 Vascular tests and f/u upcoming; were rescheduled from earlier due to intercurrent illness and appointments.

## 2023-04-29 NOTE — Assessment & Plan Note (Signed)
 Generally improved with time, therapy, and medication adjustment.  She requests a replacement rollator.

## 2023-04-29 NOTE — Assessment & Plan Note (Signed)
 Glucose remains elevated; increase degludec to 30 units and she will continue to wear her CGM (not downloaded today).  She reminded me that her current unit [name] 3 will be discontinued in 10/2023.

## 2023-04-29 NOTE — Assessment & Plan Note (Signed)
 Doing well with the reduced (20 mg) of lasix; no dependent rales, and no LE edema.  Monitor.

## 2023-04-30 ENCOUNTER — Other Ambulatory Visit (HOSPITAL_COMMUNITY): Payer: Self-pay

## 2023-04-30 ENCOUNTER — Other Ambulatory Visit: Payer: Self-pay | Admitting: Internal Medicine

## 2023-04-30 ENCOUNTER — Ambulatory Visit: Payer: Self-pay | Admitting: Internal Medicine

## 2023-04-30 DIAGNOSIS — Z794 Long term (current) use of insulin: Secondary | ICD-10-CM

## 2023-04-30 MED ORDER — INSULIN DEGLUDEC 100 UNIT/ML ~~LOC~~ SOPN
30.0000 [IU] | PEN_INJECTOR | Freq: Every morning | SUBCUTANEOUS | 6 refills | Status: DC
  Filled 2023-04-30 – 2023-05-06 (×2): qty 3, 10d supply, fill #0
  Filled 2023-05-11 – 2023-05-26 (×2): qty 3, 10d supply, fill #1
  Filled 2023-06-10: qty 3, 10d supply, fill #2
  Filled 2023-07-18: qty 12, 40d supply, fill #3

## 2023-04-30 NOTE — Telephone Encounter (Signed)
 Message forwarded to Dr. [name] for Clarification of dosing for patient.

## 2023-04-30 NOTE — Telephone Encounter (Signed)
 RTC to patient instructed to take in the morning as usual.  Voiced understanding of.  Dr. [name] to change in chart.

## 2023-04-30 NOTE — Telephone Encounter (Signed)
  Chief Complaint: medication question  Additional Notes: Pt calling to get clarification on insulin degludec (TRESIBA). Pt reports that she typically takes insulin in the morning when she wakes up (was previously on 20units), but noticed that her recent rx instructions states to take at bedtime. Pt is calling to clarify if Dr. [name] wants her to take it in the morning like she previously has, or change it to the evening.  Pt reports she will take it now, as she normally does in the morning. Triager advised pt to call for any signs of hypoglycemia. Pt expecting call back from office to receive clarification, and consented to office leaving detailed message pertaining to this request on her voicemail if she does not pick up. Patient verbalized understanding and to call back with worsening symptoms.   Copied from CRM [id number]. Topic: Clinical - Medication Question >> Apr 30, 2023  9:25 AM Hamdi H wrote: Reason for CRM: Pt noticed a discrepancy on her prescription for insulin. It says to take 30 units at bedtime but she never had it say that before. She wanted to know if this was a mistake. Also the dosage has changed on it. Reason for Disposition  [1] Caller has URGENT medicine question about med that PCP or specialist prescribed AND [2] triager unable to answer question  Answer Assessment - Initial Assessment Questions 1. NAME of MEDICINE: "What medicine(s) are you calling about?"     Insulin - treseba 2. QUESTION: "What is your question?" (e.g., double dose of medicine, side effect)     Dosage  3. PRESCRIBER: "Who prescribed the medicine?" Reason: if prescribed by specialist, call should be referred to that group.     Charissa Bash 4. SYMPTOMS: "Do you have any symptoms?" If Yes, ask: "What symptoms are you having?"  "How bad are the symptoms (e.g., mild, moderate, severe)     Reports normally takes in the morning when she gets up, however rx states at bedtime.  Current CBG 259 - reports  eating something in the middle of the night  Protocols used: Medication Question Call-A-AH

## 2023-05-01 ENCOUNTER — Other Ambulatory Visit (HOSPITAL_COMMUNITY): Payer: Self-pay

## 2023-05-01 ENCOUNTER — Other Ambulatory Visit: Payer: Self-pay | Admitting: Internal Medicine

## 2023-05-01 DIAGNOSIS — F5104 Psychophysiologic insomnia: Secondary | ICD-10-CM

## 2023-05-01 DIAGNOSIS — G8929 Other chronic pain: Secondary | ICD-10-CM

## 2023-05-04 ENCOUNTER — Other Ambulatory Visit (HOSPITAL_COMMUNITY): Payer: Self-pay

## 2023-05-04 ENCOUNTER — Other Ambulatory Visit: Payer: Self-pay

## 2023-05-04 DIAGNOSIS — I6523 Occlusion and stenosis of bilateral carotid arteries: Secondary | ICD-10-CM

## 2023-05-04 DIAGNOSIS — I70213 Atherosclerosis of native arteries of extremities with intermittent claudication, bilateral legs: Secondary | ICD-10-CM

## 2023-05-04 MED ORDER — HYDROCODONE-ACETAMINOPHEN 5-325 MG PO TABS
2.0000 | ORAL_TABLET | Freq: Two times a day (BID) | ORAL | 0 refills | Status: DC
  Filled 2023-05-04 – 2023-05-08 (×2): qty 120, 30d supply, fill #0

## 2023-05-04 MED ORDER — ALPRAZOLAM 0.5 MG PO TABS
0.5000 mg | ORAL_TABLET | Freq: Every day | ORAL | 0 refills | Status: DC
  Filled 2023-05-04 – 2023-05-08 (×3): qty 30, 30d supply, fill #0

## 2023-05-04 NOTE — Telephone Encounter (Signed)
 Pt reports INR value of 2.1 by PST FS POC device. Takes 2.5 mg Su/Tu/Th/Sa; 1/2 x 2.5 mg (1.25 mg dose) on M/W/F. Advised to continue same regimen, repeat INR [id number]. No bleeding reported, no new medications, no symptoms/signs of bleeding she states.

## 2023-05-06 ENCOUNTER — Other Ambulatory Visit: Payer: Self-pay

## 2023-05-06 ENCOUNTER — Other Ambulatory Visit (HOSPITAL_COMMUNITY): Payer: Self-pay

## 2023-05-06 DIAGNOSIS — G9340 Encephalopathy, unspecified: Secondary | ICD-10-CM | POA: Diagnosis not present

## 2023-05-06 DIAGNOSIS — R269 Unspecified abnormalities of gait and mobility: Secondary | ICD-10-CM | POA: Diagnosis not present

## 2023-05-06 DIAGNOSIS — J449 Chronic obstructive pulmonary disease, unspecified: Secondary | ICD-10-CM | POA: Diagnosis not present

## 2023-05-06 DIAGNOSIS — G4736 Sleep related hypoventilation in conditions classified elsewhere: Secondary | ICD-10-CM | POA: Diagnosis not present

## 2023-05-06 DIAGNOSIS — I272 Pulmonary hypertension, unspecified: Secondary | ICD-10-CM | POA: Diagnosis not present

## 2023-05-06 DIAGNOSIS — G4734 Idiopathic sleep related nonobstructive alveolar hypoventilation: Secondary | ICD-10-CM | POA: Diagnosis not present

## 2023-05-06 DIAGNOSIS — I509 Heart failure, unspecified: Secondary | ICD-10-CM | POA: Diagnosis not present

## 2023-05-06 DIAGNOSIS — I270 Primary pulmonary hypertension: Secondary | ICD-10-CM | POA: Diagnosis not present

## 2023-05-06 DIAGNOSIS — J439 Emphysema, unspecified: Secondary | ICD-10-CM | POA: Diagnosis not present

## 2023-05-06 DIAGNOSIS — S72142D Displaced intertrochanteric fracture of left femur, subsequent encounter for closed fracture with routine healing: Secondary | ICD-10-CM | POA: Diagnosis not present

## 2023-05-06 DIAGNOSIS — J9601 Acute respiratory failure with hypoxia: Secondary | ICD-10-CM | POA: Diagnosis not present

## 2023-05-08 ENCOUNTER — Other Ambulatory Visit: Payer: Self-pay

## 2023-05-08 ENCOUNTER — Other Ambulatory Visit (HOSPITAL_COMMUNITY): Payer: Self-pay

## 2023-05-11 ENCOUNTER — Other Ambulatory Visit (HOSPITAL_COMMUNITY): Payer: Self-pay

## 2023-05-11 ENCOUNTER — Other Ambulatory Visit: Payer: Self-pay

## 2023-05-11 DIAGNOSIS — I480 Paroxysmal atrial fibrillation: Secondary | ICD-10-CM | POA: Diagnosis not present

## 2023-05-11 DIAGNOSIS — Z7901 Long term (current) use of anticoagulants: Secondary | ICD-10-CM | POA: Diagnosis not present

## 2023-05-11 NOTE — Telephone Encounter (Signed)
 Pt texted results of PST FS POC INR determination performed today at 1002h = 1.70 on 1.25 mg MWF; 2.5 mg warfarin all other days. Instructed to increase dose to 2.5mg  warfarin M/Tu/Th/Sa/Su; 1.25mg  warfarin on Wed/Friday. Repeat INR 7-APR-25. No bleeding.

## 2023-05-12 ENCOUNTER — Other Ambulatory Visit (HOSPITAL_COMMUNITY): Payer: Self-pay

## 2023-05-12 ENCOUNTER — Other Ambulatory Visit: Payer: Self-pay

## 2023-05-12 ENCOUNTER — Telehealth: Payer: Self-pay | Admitting: Student

## 2023-05-12 NOTE — Telephone Encounter (Signed)
 Received after-hours page.  Returned call to patient and confirmed DOB.  Patient reports shortness of breath with exertion that started today, none at rest.  Did have episode of non-radiating chest pain which has now resolved.  Denies any fever, chills, new cough, nausea or vomiting.  No sick contacts.  Endorses mild ankle swelling but no further leg swelling.  Still taking Lasix 20 mg daily.  Denies any wheezing.  She completed a nebulizer treatment which improved her symptoms.  She is able to speak in full sentences without any labored breathing over the phone.  She states she is feeling much improved with nebulizer treatment.  She has home O2 that she uses about 2.5 L at night and denies any increase in supplemental oxygen the past week.  Shared-decision making with patient. Since patient has improved with nebulizer treatment to monitor her symptoms overnight.  Return precautions given to patient if symptoms worsening or new symptoms develop to seek emergent evaluation at the ED.  Discussed contacting [facility] if she needs to reschedule her follow-up appointment.  Patient verbalizes understanding and agrees with plan.

## 2023-05-14 ENCOUNTER — Ambulatory Visit: Payer: PPO | Admitting: Physician Assistant

## 2023-05-14 ENCOUNTER — Ambulatory Visit (INDEPENDENT_AMBULATORY_CARE_PROVIDER_SITE_OTHER)
Admission: RE | Admit: 2023-05-14 | Discharge: 2023-05-14 | Disposition: A | Discharge status: Home / Self Care | Payer: PPO | Source: Ambulatory Visit | Attending: Surgery | Admitting: Surgery

## 2023-05-14 ENCOUNTER — Ambulatory Visit (HOSPITAL_COMMUNITY)
Admission: RE | Admit: 2023-05-14 | Discharge: 2023-05-14 | Disposition: A | Discharge status: Home / Self Care | Payer: PPO | Source: Ambulatory Visit | Attending: Surgery | Admitting: Surgery

## 2023-05-14 VITALS — BP 163/81 | HR 54 | Temp 98.3°F | Ht 62.0 in | Wt 134.0 lb

## 2023-05-14 DIAGNOSIS — I70213 Atherosclerosis of native arteries of extremities with intermittent claudication, bilateral legs: Secondary | ICD-10-CM

## 2023-05-14 DIAGNOSIS — I6523 Occlusion and stenosis of bilateral carotid arteries: Secondary | ICD-10-CM

## 2023-05-14 DIAGNOSIS — I739 Peripheral vascular disease, unspecified: Secondary | ICD-10-CM

## 2023-05-14 LAB — VAS US ABI WITH/WO TBI
Left ABI: 0.99
Right ABI: 0.54

## 2023-05-14 NOTE — Progress Notes (Signed)
 Office Note     CC:  follow up Requesting Provider:  Miguel Aschoff, MD  HPI: [name] is a 73 y.o. (04-04-1950) female who presents for surveillance of carotid artery stenosis as well as PAD.  She underwent right carotid endarterectomy in 2012.  She denies any strokelike symptoms since last office visit including slurring speech, changes in vision, or one-sided weakness.  She is also followed for PAD.  She is seen today in a wheelchair however ambulates with a walker normally.  She denies claudication symptoms.  She admittedly does not walk enough to elicit symptoms.  She is limited by her breathing as well as her osteoarthritis of lower back, hips, and knees.  She is without tissue loss or rest pain in the feet.  She has edema in her legs left more than the right however this does not bother her enough to wear compression on a daily basis.  Past medical history also significant for type 2 diabetes mellitus.   Past Medical History:  Diagnosis Date   Abnormality of lung on CXR 02/02/2020   Nonspecific finding on CXR ordered by pulmonologist - c/w inflammation vs infection, f/u imaging suggested, Dr. Roxy Cedar office has been in communication about recommended next steps.   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   Non-bleeding when visualized on capsule endoscopy 06/30/2015    Arthritis    "lower back; hands" (02/19/2018)   Asthma    Asymptomatic cholelithiasis 09/25/2015   Seen on CT scan 08/2015   Carotid artery stenosis; s/p R endarterectomy    s/p right endarterectomy (06/2010) Carotid US (07/2010):  Left: Moderate-to-severe (60-79%) calcific and non-calcific plaque origin and proximal ICA and ECA    Chronic congestive heart failure with left ventricular diastolic dysfunction (HCC) 10/21/2010   Chronic constipation 02/03/2011   Chronic  daily headache 01/16/2014   Chronic iron deficiency anemia    Chronic low back pain 10/06/2012   Chronic venous insufficiency 08/04/2012   Closed fracture of one rib of left side 08/23/2021   ED visit after a fall 08/16/21    COPD (chronic obstructive pulmonary disease) with emphysema (HCC)    PFTs 2018: severe obstructive disease, insignif response to bronchiodilator, mild restriction parenchymal pattern, moderately severe diffusion defect. 2014  FEV1 0.92 (40%), ratio 69, 27% increase in FEV1 with BD, TLC 91%, severe airtrapping, DLCO49% On chronic home O2. Pulmonary rehab referral 05/2012    Dyspnea    Fibromyalgia 08/29/2010   Gastroesophageal reflux disease    History of blood transfusion    "several times"  (02/19/2018)   History of clear cell renal cell carcinoma (HCC), in remission 07/21/2011   s/p cryoablation of left RCC in 09/2011 by Dr. Fredia Sorrow. Followed by Dr. Retta Diones  [facility]) .     History of fracture of left hip 10/17/2022   History of hiatal hernia    History of mitral valve replacement with bioprosthetic valve due to mitral stenosis 2012   s/p MVR with a 27-mm pericardial porcine valve (Medtronic Mosaic valve, serial #[id number] on 09/20/10, Dr. Donata Clay)    History of obstructive sleep apnea, resolved 2013   resolved per sleep study 07/2019; no apnea, but did have desaturation.  CPAP no longer necessary.  Nocturnal polysomnography (06/2009): Moderate sleep apnea/ hypopnea syndrome , AHI 17.8 per hour with nonpositional hypopneas. CPAP titration to 12 CWP, AHI 2.4 per hour. On nocturnal  CPAP via a small resMed Quattro full-face mask with heated humidifier.    History of pneumonia    "once"  (02/19/2018)   History of seborrheic keratosis 09/28/2015   History of stroke without residual deficits 01/17/2022   Hyperlipidemia LDL goal < 100 11/20/2005   Internal and external hemorrhoids without complication 08/04/2012   Lesion of left native kidney 06/01/2020    Incidental finding on recent screening chest CT for lung ca ordered by Dr. Maple Hudson pulmonology - he has ordered a f/u renal U/S.    Lichen sclerosus of female genitalia 01/12/2017   Migraine    "none in years" (02/19/2018)   Mild cognitive impairment with memory loss 12/23/2021   Moderately severe major depression (HCC) 11/19/2005   Nocturnal hypoxia per sleep study 07/2019    Osteoporosis    DEXA 2016: T -2.7; DEXA (12/09/2011): L-spine T -3.7, left hip T -1.4 DEXA (12/2004): L-spine T -2.6, left hip -0.1    Paroxysmal atrial fibrillation (HCC) 10/22/2010   s/p Left atrial maze procedure for paroxysmal atrial fibrillation on 09/20/2010 by Dr Donata Clay.  Subsequent splenic infarct, decision was made to re-anticoagulate with coumadin, likely life-long as this is the most likely cause of the splenic infarct.    Personal history of colonic polyps 05/14/2011   Colonoscopy (05/2011): 4 mm adenomatous polyp excised endoscopically Colonoscopy (02/2002): Adenomatous polyp excised endoscopically    Personal history of renal cell carcinoma 09/12/2020   Pneumonia    Pulmonary hypertension due to chronic obstructive pulmonary disease (HCC) 04/25/2016   2014 TEE w PA peak pressure 46 mmHg, s/p MV replacement    Right nephrolithiasis, asymptomatic, incidental finding 09/06/2014   5 mm non-obstructing calculus seen on CT scan 09/05/2014    Right ventricular failure (HCC) 04/25/2016   Severe obesity (BMI 35.0-39.9) with comorbidity (HCC) 10/23/2011   Sleep apnea    Stage 2 chronic kidney disease due to type 2 diabetes mellitus (HCC) 12/16/2018   CKD stage III     Tobacco abuse 07/28/2012   Type 2 diabetes mellitus with complication, with long-term current use of insulin (HCC)    Type 2 diabetes mellitus with diabetic neuropathy (HCC)    Weight loss due to medication 03/12/2023    Past Surgical History:  Procedure Laterality Date   BIOPSY  07/19/2021   Procedure: BIOPSY;  Surgeon: Vida Rigger, MD;  Location:  [facility] ENDOSCOPY;  Service: Gastroenterology;;   CARDIAC CATHETERIZATION     CARDIAC VALVE REPLACEMENT  Aug. 2012   "mitral valve"   CAROTID ENDARTERECTOMY Right 07/04/2010   by Dr. Myra Gianotti for asymptomatic right carotid artery stenosis   CATARACT EXTRACTION W/ INTRAOCULAR LENS  IMPLANT, BILATERAL Bilateral    CHEST TUBE INSERTION  09/24/2010   Dr Zenaida Niece Trigt   COLONOSCOPY  05/12/2011   performed by Dr. Bosie Clos. Showing small internal hemorrhoids, single tubular adenoma polyp   COLONOSCOPY WITH PROPOFOL N/A 05/29/2020   Procedure: COLONOSCOPY WITH PROPOFOL;  Surgeon: Vida Rigger, MD;  Location: [facility];  Service: Endoscopy;  Laterality: N/A;   COLONOSCOPY WITH PROPOFOL N/A 07/19/2021   Procedure: COLONOSCOPY WITH PROPOFOL;  Surgeon: Vida Rigger, MD;  Location: [facility];  Service: Gastroenterology;  Laterality: N/A;   CRYOABLATION Left 09/2011   by Dr. Fredia Sorrow. Followed by Dr. Retta Diones  [facility]) .     DILATION AND CURETTAGE OF UTERUS     ESOPHAGOGASTRODUODENOSCOPY  05/12/2011   performed by Dr. Bosie Clos. Negative for ulcerations, biopsy negative for evidence of celiac sprue   ESOPHAGOGASTRODUODENOSCOPY N/A  06/29/2015   Procedure: ESOPHAGOGASTRODUODENOSCOPY (EGD);  Surgeon: Vida Rigger, MD;  Location: [facility] ENDOSCOPY;  Service: Endoscopy;  Laterality: N/A;   ESOPHAGOGASTRODUODENOSCOPY N/A 03/29/2016   Procedure: ESOPHAGOGASTRODUODENOSCOPY (EGD);  Surgeon: Vida Rigger, MD;  Location: [facility] ENDOSCOPY;  Service: Endoscopy;  Laterality: N/A;   ESOPHAGOGASTRODUODENOSCOPY (EGD) WITH PROPOFOL N/A 05/29/2020   Procedure: ESOPHAGOGASTRODUODENOSCOPY (EGD) WITH PROPOFOL;  Surgeon: Vida Rigger, MD;  Location: [facility];  Service: Endoscopy;  Laterality: N/A;   FRACTURE SURGERY     GIVENS CAPSULE STUDY N/A 06/30/2015   Procedure: GIVENS CAPSULE STUDY;  Surgeon: Vida Rigger, MD;  Location: [facility] ENDOSCOPY;  Service: Endoscopy;  Laterality: N/A;   GIVENS CAPSULE STUDY N/A 06/29/2015   Procedure: GIVENS CAPSULE  STUDY;  Surgeon: Vida Rigger, MD;  Location: [facility] ENDOSCOPY;  Service: Endoscopy;  Laterality: N/A;   HEMORRHOID SURGERY  1970s?   "lanced"   HYSTEROSCOPY W/ ENDOMETRIAL ABLATION  06/2001   for persistent post-menopausal bleeding // by S. Kyra Manges, M.D.   INTRAMEDULLARY (IM) NAIL INTERTROCHANTERIC Left 10/19/2022   Procedure: LEFT INTRAMEDULLARY (IM) NAIL INTERTROCHANTERIC;  Surgeon: Luci Bank, MD;  Location: [facility] OR;  Service: Orthopedics;  Laterality: Left;   IR GENERIC HISTORICAL  08/23/2015   IR RADIOLOGIST EVAL & MGMT 08/23/2015 Irish Lack, MD [facility] INTERV RAD   IR GENERIC HISTORICAL  04/09/2016   IR RADIOLOGIST EVAL & MGMT 04/09/2016 Irish Lack, MD [facility] INTERV RAD   IR RADIOLOGIST EVAL & MGMT  10/07/2016   IR RADIOLOGIST EVAL & MGMT  06/25/2017   IR RADIOLOGIST EVAL & MGMT  07/31/2020   IR RADIOLOGIST EVAL & MGMT  10/02/2020   IR RADIOLOGIST EVAL & MGMT  12/20/2020   IR RADIOLOGIST EVAL & MGMT  12/25/2021   IR RADIOLOGIST EVAL & MGMT  04/16/2023   LEFT HEART CATH AND CORONARY ANGIOGRAPHY N/A 04/21/2016   Procedure: Left Heart Cath and Coronary Angiography;  Surgeon: Runell Gess, MD;  Location: [facility] INVASIVE CV LAB;  Service: Cardiovascular;  Laterality: N/A;   LIPOMA EXCISION  08/2005   occipital lipoma 1.5cm - by Dr. Maryagnes Amos   LITHOTRIPSY  ~ 2000   MAZE Left 09/20/10   for paroxysmal atrial fibrillation (Dr. Donata Clay)   MITRAL VALVE REPLACEMENT  09/20/10    with a 27-mm pericardial porcine valve (Medtronic Mosaic valve, serial #[id number]). 09/20/10, Dr Donata Clay   ORIF CLAVICLE FRACTURE Right 01/2004   by Veverly Fells. Ophelia Charter, M.D for Right clavicle nonunion.; "it's got a pin in it"   POLYPECTOMY  05/29/2020   Procedure: POLYPECTOMY;  Surgeon: Vida Rigger, MD;  Location: [facility] END[facility];  Service: Endoscopy;;   POLYPECTOMY  07/19/2021   Procedure: POLYPECTOMY;  Surgeon: Vida Rigger, MD;  Location: [facility] ENDOSCOPY;  Service: Gastroenterology;;   RADIOLOGY WITH ANESTHESIA Left 09/12/2020    Procedure: RADIOLOGY WITH ANESTHESIA- RENAL CRYOABLATION;  Surgeon: Irish Lack, MD;  Location: [facility] ORS;  Service: Radiology;  Laterality: Left;   REFRACTIVE SURGERY Bilateral    RIGHT HEART CATH N/A 04/23/2016   Procedure: Right Heart Cath;  Surgeon: Laurey Morale, MD;  Location: [facility] INVASIVE CV LAB;  Service: Cardiovascular;  Laterality: N/A;   TONSILLECTOMY     TUBAL LIGATION      Social History   Socioeconomic History   Marital status: Divorced    Spouse name: Not on file   Number of children: Not on file   Years of education: Not on file   Highest education level: Not on file  Occupational History   Not on  file  Tobacco Use   Smoking status: Every Day    Current packs/day: 1.00    Average packs/day: 1 pack/day for 60.5 years (60.5 ttl pk-yrs)    Types: Cigarettes    Start date: 11/01/1962   Smokeless tobacco: Never   Tobacco comments:    At least 1 PPD  Vaping Use   Vaping status: Never Used  Substance and Sexual Activity   Alcohol use: Not Currently    Comment: 02/19/2018 "nothing since 1999"   Drug use: Not Currently    Types: Marijuana    Comment: 02/19/2018 "nothing since the 1990s"   Sexual activity: Not Currently    Birth control/protection: Post-menopausal  Other Topics Concern   Not on file  Social History Narrative   Lives alone in [place] ([address])   Worked at [employer] for 18 years   No car   Social Drivers of Corporate investment banker Strain: Low Risk  (04/08/2023)   Overall Financial Resource Strain (CARDIA)    Difficulty of Paying Living Expenses: Not hard at all  Food Insecurity: Food Insecurity Present (04/08/2023)   Hunger Vital Sign    Worried About Running Out of Food in the Last Year: Never true    Ran Out of Food in the Last Year: Sometimes true  Transportation Needs: Unmet Transportation Needs (04/08/2023)   PRAPARE - Transportation    Lack of Transportation (Medical): Yes    Lack of Transportation (Non-Medical): Yes   Physical Activity: Inactive (04/08/2023)   Exercise Vital Sign    Days of Exercise per Week: 0 days    Minutes of Exercise per Session: 0 min  Stress: Stress Concern Present (04/08/2023)   [employer] of Occupational Health - Occupational Stress Questionnaire    Feeling of Stress : Rather much  Social Connections: Socially Isolated (04/08/2023)   Social Connection and Isolation Panel [NHANES]    Frequency of Communication with Friends and Family: More than three times a week    Frequency of Social Gatherings with Friends and Family: More than three times a week    Attends Religious Services: Never    Database administrator or Organizations: No    Attends Banker Meetings: Never    Marital Status: Divorced  Catering manager Violence: Not At Risk (04/08/2023)   Humiliation, Afraid, Rape, and Kick questionnaire    Fear of Current or Ex-Partner: No    Emotionally Abused: No    Physically Abused: No    Sexually Abused: No    Family History  Problem Relation Age of Onset   Peptic Ulcer Disease Father    Heart attack Father 36       Died of MI at age 29   Heart attack Brother 13       Died of MI at age 41   Obesity Brother    Pneumonia Mother    Healthy Sister    Lupus Daughter    Obsessive Compulsive Disorder Daughter     Current Outpatient Medications  Medication Sig Dispense Refill   albuterol (VENTOLIN HFA) 108 (90 Base) MCG/ACT inhaler Inhale 2 puffs into the lungs every 6 hours as needed for shortness of breath 54 g 10   ALPRAZolam (XANAX) 0.5 MG tablet Take 1 tablet (0.5 mg total) by mouth at bedtime. To help with sleep. 30 tablet 0   buPROPion (WELLBUTRIN XL) 150 MG 24 hr tablet Take 1 tablet (150 mg total) by mouth every morning. 90 tablet 3  Continuous Glucose Sensor (FREESTYLE LIBRE 3 PLUS SENSOR) MISC Change sensor every 15 days. 6 each 3   Fluticasone-Umeclidin-Vilant (TRELEGY ELLIPTA) 100-62.5-25 MCG/ACT AEPB Inhale 1 puff into the lungs daily. 180  each 3   furosemide (LASIX) 40 MG tablet Take 0.5 tablets (20 mg total) by mouth daily. 90 tablet 3   gabapentin (NEURONTIN) 600 MG tablet Take 1 tablet (600 mg total) by mouth at bedtime. 90 tablet 3   HYDROcodone-acetaminophen (NORCO/VICODIN) 5-325 MG tablet Take 2 tablets by mouth 2 (two) times daily. 120 tablet 0   insulin degludec (TRESIBA) 100 UNIT/ML FlexTouch Pen Inject 30 Units into the skin every morning. 3 mL 6   ipratropium (ATROVENT) 0.03 % nasal spray Instill 2 sprays into the nose 3 (three) times daily as directed 30 mL 2   ipratropium-albuterol (DUONEB) 0.5-2.5 (3) MG/3ML SOLN Inhale 3 mLs by nebulization every 6 (six) hours as needed (shortness of breath, wheezing). 90 mL 3   ketoconazole (NIZORAL) 2 % cream Apply 1 application topically at bedtime as needed for irritation. 60 g 5   linaclotide (LINZESS) 72 MCG capsule Take 1 capsule (72 mcg total) by mouth daily before breakfast. 90 capsule 3   metoprolol succinate (TOPROL XL) 25 MG 24 hr tablet Take 1 tablet (25 mg total) by mouth daily. 45 tablet 3   omeprazole (PRILOSEC) 40 MG capsule Take 1 capsule (40 mg total) by mouth 2 (two) times daily. 60 capsule 5   ondansetron (ZOFRAN) 4 MG tablet Take 1 tablet (4 mg total) by mouth daily as needed for nausea or vomiting. 30 tablet 1   OXYGEN Inhale 2 L into the lungs at bedtime.     potassium chloride (KLOR-CON M) 10 MEQ tablet Take 1 tablet (10 mEq total) by mouth daily. 30 tablet 11   ramelteon (ROZEREM) 8 MG tablet Take 1 tablet (8 mg total) by mouth at bedtime. 30 tablet 3   venlafaxine XR (EFFEXOR-XR) 150 MG 24 hr capsule Take 1 capsule (150 mg total) by mouth daily with breakfast. 90 capsule 3   warfarin (COUMADIN) 2.5 MG tablet Take 1 tablet (2.5 mg total) by mouth daily unless otherwise instructed 90 tablet 3   Continuous Glucose Sensor (FREESTYLE LIBRE 3 SENSOR) MISC Place 1 sensor on the skin every 14 days. Use to check glucose 6 times daily as directed 6 each 3   No  current facility-administered medications for this visit.    Allergies  Allergen Reactions   Dexilant [Dexlansoprazole] Other (See Comments)    Made acid reflux worse   Isovue [Iopamidol] Itching and Rash   Lorazepam Other (See Comments)    Patient's sister noted that ativan caused the patient to become extremely confused during hospitalization 09/2010; tolerates Xanax   Oxycontin [Oxycodone] Other (See Comments)    headache   Tramadol Hcl Swelling    Ankle swelling     REVIEW OF SYSTEMS:   [X]  denotes positive finding, [ ]  denotes negative finding Cardiac  Comments:  Chest pain or chest pressure:    Shortness of breath upon exertion:    Short of breath when lying flat:    Irregular heart rhythm:        Vascular    Pain in calf, thigh, or hip brought on by ambulation:    Pain in feet at night that wakes you up from your sleep:     Blood clot in your veins:    Leg swelling:         Pulmonary  Oxygen at home:    Productive cough:     Wheezing:         Neurologic    Sudden weakness in arms or legs:     Sudden numbness in arms or legs:     Sudden onset of difficulty speaking or slurred speech:    Temporary loss of vision in one eye:     Problems with dizziness:         Gastrointestinal    Blood in stool:     Vomited blood:         Genitourinary    Burning when urinating:     Blood in urine:        Psychiatric    Major depression:         Hematologic    Bleeding problems:    Problems with blood clotting too easily:        Skin    Rashes or ulcers:        Constitutional    Fever or chills:      PHYSICAL EXAMINATION:  Vitals:   05/14/23 1113 05/14/23 1115  BP: (!) 171/91 (!) 163/81  Pulse: (!) 54   Temp: 98.3 F (36.8 C)   TempSrc: Temporal   SpO2:  90%  Weight: 134 lb (60.8 kg)   Height: 5\' 2"  (1.575 m)     General:  WDWN in NAD; vital signs documented above Gait: Not observed HENT: WNL, normocephalic Pulmonary: normal non-labored  breathing , without Rales, rhonchi,  wheezing Cardiac: regular HR Abdomen: soft, NT, no masses Skin: without rashes Vascular Exam/Pulses: absent pedal pulses Extremities: without ischemic changes, without Gangrene , without cellulitis; without open wounds; venous congestion of her feet, discoloration improves with elevation; pitting edema of left leg to the level of the mid shin Musculoskeletal: no muscle wasting or atrophy  Neurologic: A&O X 3 Psychiatric:  The pt has Normal affect.   Non-Invasive Vascular Imaging:   Right carotid endarterectomy site widely patent Left ICA 1 to 39% stenosis  ABI/TBIToday's ABIToday's TBIPrevious ABIPrevious TBI  +-------+-----------+-----------+------------+------------+  Right 0.54       0.3        0.44        0.32          +-------+-----------+-----------+------------+------------+  Left  0.99       0.56       0.86        0.72          +-------+-----------+-----------+------------+------------+     ASSESSMENT/PLAN:: 73 y.o. female here for follow up for surveillance of carotid artery stenosis and PAD  Subjectively she has not experienced any strokelike symptoms since last office visit.  Carotid duplex demonstrates a widely patent right carotid endarterectomy site.  Left ICA also 1 to 39% stenosis.  We will repeat carotid duplex in 1 year.  She is also followed for PAD.  She admittedly is not very mobile and is limited to ambulation with a walker.  She denies any claudication symptoms.  She has discoloration in her feet however this is related to venous congestion.  She does not have any tissue loss or rest pain of the feet.  She has pain in both legs however this is multifactorial likely related to lumbar back disease as well as osteoarthritis of the hips and knees.  ABIs are essentially unchanged.  We likely would not intervene unless she develops rest pain or tissue loss of the feet.  We will repeat ABIs in 1 year.  Emilie Rutter, PA-C [facility] [phone]  Clinic MD:   Karin Lieu

## 2023-05-15 ENCOUNTER — Ambulatory Visit: Payer: Self-pay

## 2023-05-15 ENCOUNTER — Other Ambulatory Visit: Payer: Self-pay

## 2023-05-15 ENCOUNTER — Emergency Department (HOSPITAL_COMMUNITY)

## 2023-05-15 ENCOUNTER — Ambulatory Visit: Payer: Self-pay | Admitting: *Deleted

## 2023-05-15 ENCOUNTER — Other Ambulatory Visit (HOSPITAL_COMMUNITY): Payer: Self-pay

## 2023-05-15 ENCOUNTER — Emergency Department (HOSPITAL_COMMUNITY)
Admission: EM | Admit: 2023-05-15 | Discharge: 2023-05-15 | Disposition: A | Discharge status: Home / Self Care | Attending: Emergency Medicine | Admitting: Emergency Medicine

## 2023-05-15 ENCOUNTER — Encounter (HOSPITAL_COMMUNITY): Payer: Self-pay

## 2023-05-15 DIAGNOSIS — R739 Hyperglycemia, unspecified: Secondary | ICD-10-CM | POA: Diagnosis not present

## 2023-05-15 DIAGNOSIS — I959 Hypotension, unspecified: Secondary | ICD-10-CM | POA: Diagnosis not present

## 2023-05-15 DIAGNOSIS — I700 Atherosclerosis of aorta: Secondary | ICD-10-CM | POA: Diagnosis not present

## 2023-05-15 DIAGNOSIS — S2241XA Multiple fractures of ribs, right side, initial encounter for closed fracture: Secondary | ICD-10-CM | POA: Diagnosis not present

## 2023-05-15 DIAGNOSIS — R079 Chest pain, unspecified: Secondary | ICD-10-CM | POA: Diagnosis not present

## 2023-05-15 DIAGNOSIS — R519 Headache, unspecified: Secondary | ICD-10-CM | POA: Insufficient documentation

## 2023-05-15 DIAGNOSIS — J4489 Other specified chronic obstructive pulmonary disease: Secondary | ICD-10-CM | POA: Diagnosis not present

## 2023-05-15 DIAGNOSIS — I6523 Occlusion and stenosis of bilateral carotid arteries: Secondary | ICD-10-CM | POA: Diagnosis not present

## 2023-05-15 DIAGNOSIS — J449 Chronic obstructive pulmonary disease, unspecified: Secondary | ICD-10-CM | POA: Diagnosis not present

## 2023-05-15 DIAGNOSIS — R0789 Other chest pain: Secondary | ICD-10-CM

## 2023-05-15 DIAGNOSIS — J441 Chronic obstructive pulmonary disease with (acute) exacerbation: Secondary | ICD-10-CM | POA: Insufficient documentation

## 2023-05-15 DIAGNOSIS — S0990XA Unspecified injury of head, initial encounter: Secondary | ICD-10-CM | POA: Diagnosis not present

## 2023-05-15 DIAGNOSIS — I10 Essential (primary) hypertension: Secondary | ICD-10-CM | POA: Diagnosis not present

## 2023-05-15 LAB — CBC
HCT: 31.6 % — ABNORMAL LOW (ref 36.0–46.0)
Hemoglobin: 8.7 g/dL — ABNORMAL LOW (ref 12.0–15.0)
MCH: 21.1 pg — ABNORMAL LOW (ref 26.0–34.0)
MCHC: 27.5 g/dL — ABNORMAL LOW (ref 30.0–36.0)
MCV: 76.7 fL — ABNORMAL LOW (ref 80.0–100.0)
Platelets: 206 10*3/uL (ref 150–400)
RBC: 4.12 MIL/uL (ref 3.87–5.11)
RDW: 18.9 % — ABNORMAL HIGH (ref 11.5–15.5)
WBC: 8.4 10*3/uL (ref 4.0–10.5)
nRBC: 0 % (ref 0.0–0.2)

## 2023-05-15 LAB — TROPONIN I (HIGH SENSITIVITY)
Troponin I (High Sensitivity): 13 ng/L (ref <18)
Troponin I (High Sensitivity): 11 ng/L (ref <18)

## 2023-05-15 LAB — BASIC METABOLIC PANEL WITH GFR
Anion gap: 9 (ref 5–15)
BUN: 16 mg/dL (ref 8–23)
CO2: 26 mmol/L (ref 22–32)
Calcium: 9.1 mg/dL (ref 8.9–10.3)
Chloride: 104 mmol/L (ref 98–111)
Creatinine, Ser: 0.75 mg/dL (ref 0.44–1.00)
GFR, Estimated: >60 mL/min (ref >60)
Glucose, Bld: 191 mg/dL — ABNORMAL HIGH (ref 70–99)
Potassium: 3.8 mmol/L (ref 3.5–5.1)
Sodium: 139 mmol/L (ref 135–145)

## 2023-05-15 LAB — PROTIME-INR
INR: 1.3 — ABNORMAL HIGH (ref 0.8–1.2)
Prothrombin Time: 16 s — ABNORMAL HIGH (ref 11.4–15.2)

## 2023-05-15 MED ORDER — METOPROLOL SUCCINATE ER 25 MG PO TB24
25.0000 mg | ORAL_TABLET | Freq: Every day | ORAL | Status: DC
  Administered 2023-05-15: 25 mg via ORAL
  Filled 2023-05-15: qty 1

## 2023-05-15 MED ORDER — PREDNISONE 20 MG PO TABS
40.0000 mg | ORAL_TABLET | Freq: Every day | ORAL | 0 refills | Status: DC
  Filled 2023-05-15 (×2): qty 10, 5d supply, fill #0

## 2023-05-15 NOTE — Telephone Encounter (Signed)
 Patient called back and reports she went to ED and was prescribed prednisone for sx of headache. Patient concerned this medication will increase blood sugar and would like for PCP to recommend if she should take the prednisone. Patient unable to report what her blood glucose is at this time. Instructed patient she should use her libre sensor and monitor her blood sugars while taking prednisone. Please advise regarding if patient should take prednisone or not .

## 2023-05-15 NOTE — Telephone Encounter (Signed)
 Copied from CRM [id number]. Topic: Clinical - Medication Question >> May 15, 2023  8:53 AM Prudencio Pair wrote: Reason for CRM: Patient is requesting a call back from the Triage Nurse. She states she just came from the ER and was prescribed a medication that she don't think she's suppose to take. States she would like for someone to give her a call preferably in the next hour, if possible before the medication is picked up. CB #: [phone]. This encounter was created in error - please disregard.

## 2023-05-15 NOTE — Discharge Instructions (Addendum)
 You were seen today for chest pain.  Your workup today is reassuring including heart testing.  Follow-up with your primary doctor.  You may have some element of a COPD exacerbation.  You were given a dose of steroids and will be discharged with some prednisone.  Your CT scan was negative for any acute injury.  Your INR is subtherapeutic at 1.3.  Follow-up with your primary doctor for adjustments in medications.  You have been referred back to cardiology.  You need to reestablish care.  You previously saw Dr. Shirlee Latch.

## 2023-05-15 NOTE — Telephone Encounter (Signed)
 Noted thank you

## 2023-05-15 NOTE — ED Triage Notes (Signed)
 Pt BIB [facility] d/t CP from home.  Pain only at night - smoker - did not take nebs tonight had some wheezing with EMS - gave 2 Duonebs, 125 IV Solumedrol ans 324mg  PO with F - got 1 nitroglycerin and EMS and got bad Headache.  BP 200/140

## 2023-05-15 NOTE — Telephone Encounter (Signed)
 Chief Complaint: Headache Symptoms: 9/10 pain, difficulty sleeping Frequency: Current x 2 days Pertinent Negatives: Patient denies vision changes, N/V Disposition: [] ED /[] Urgent Care (no appt availability in office) / [x] Appointment(In office/virtual)/ []  [place] [facility]/ [] Home Care/ [x] Refused Recommended Disposition /[] [place] [facility]/ []  Follow-up with PCP Additional Notes: Pt reports she has been experiencing ongoing HA, PCP aware, CT done recently. Pt notes the current HA has been ongoing for 2 days, reports 9/10 pain, difficulty sleeping. Denies vision changes, neck pain/stiffness, N/V. OV offered, pt declines. This RN educated pt on home care, new-worsening symptoms, when to call back/seek emergent care. Pt verbalized understanding and agrees to plan.    Copied from CRM [id number]. Topic: Clinical - Red Word Triage >> May 15, 2023  9:34 AM Philippa Chester F wrote: [facility] that prompted transfer to Nurse Triage: head pain; Reason for Disposition  [1] SEVERE headache (e.g., excruciating) AND [2] not improved after 2 hours of pain medicine  Answer Assessment - Initial Assessment Questions 1. LOCATION: "Where does it hurt?"      Left side across temple 2. ONSET: "When did the headache start?" (Minutes, hours or days)      Ongoing, "I don't know, this bad for 2 days" 3. PATTERN: "Does the pain come and go, or has it been constant since it started?"     Constant 4. SEVERITY: "How bad is the pain?" and "What does it keep you from doing?"  (e.g., Scale 1-10; mild, moderate, or severe)   - MILD (1-3): doesn't interfere with normal activities    - MODERATE (4-7): interferes with normal activities or awakens from sleep    - SEVERE (8-10): excruciating pain, unable to do any normal activities        9/10 5. RECURRENT SYMPTOM: "Have you ever had headaches before?" If Yes, ask: "When was the last time?" and "What happened that time?"      Yes 6. CAUSE: "What do you think is causing the  headache?"     Unknown 7. MIGRAINE: "Have you been diagnosed with migraine headaches?" If Yes, ask: "Is this headache similar?"      History, this is not a migraine per pt 8. HEAD INJURY: "Has there been any recent injury to the head?"      Fell prior to last appt with Dr. [name] and hit head 9. OTHER SYMPTOMS: "Do you have any other symptoms?" (fever, stiff neck, eye pain, sore throat, cold symptoms)     Some eye pain, but recently saw optho  Protocols used: Headache-A-AH

## 2023-05-15 NOTE — ED Provider Notes (Signed)
 [place] EMERGENCY DEPARTMENT AT [facility] Provider Note   CSN: [id number] Arrival date & time: 05/15/23  0100     History  Chief Complaint  Patient presents with   Chest Pain    [name] is a 73 y.o. female.  HPI     This is a 73 year old female who presents with chest pain.  States that for the last 3 nights she has had pain but only at night.  It is nonexertional.  She is a smoker.  Per EMS she was wheezing and was given a neb and Solu-Medrol.  She was also given aspirin.  She was given nitroglycerin but got a headache.  She states she is not having any chest pain right now.  No recent fevers.  Has an occasional cough.  Home Medications Prior to Admission medications   Medication Sig Start Date End Date Taking? Authorizing Provider  predniSONE (DELTASONE) 20 MG tablet Take 2 tablets (40 mg total) by mouth daily. 05/15/23  Yes Lakaya Tolen, Mayer Masker, MD  albuterol (VENTOLIN HFA) 108 (90 Base) MCG/ACT inhaler Inhale 2 puffs into the lungs every 6 hours as needed for shortness of breath 01/21/23   Miguel Aschoff, MD  ALPRAZolam Prudy Feeler) 0.5 MG tablet Take 1 tablet (0.5 mg total) by mouth at bedtime. To help with sleep. 05/04/23   Miguel Aschoff, MD  buPROPion (WELLBUTRIN XL) 150 MG 24 hr tablet Take 1 tablet (150 mg total) by mouth every morning. 12/24/22   Miguel Aschoff, MD  Continuous Glucose Sensor (FREESTYLE LIBRE 3 PLUS SENSOR) MISC Change sensor every 15 days. 04/29/23   Miguel Aschoff, MD  Continuous Glucose Sensor (FREESTYLE LIBRE 3 SENSOR) MISC Place 1 sensor on the skin every 14 days. Use to check glucose 6 times daily as directed 01/02/23   Miguel Aschoff, MD  Fluticasone-Umeclidin-Vilant (TRELEGY ELLIPTA) 100-62.5-25 MCG/ACT AEPB Inhale 1 puff into the lungs daily. 03/22/23   Oretha Milch, MD  furosemide (LASIX) 40 MG tablet Take 0.5 tablets (20 mg total) by mouth daily. 04/07/23   Miguel Aschoff, MD  gabapentin  (NEURONTIN) 600 MG tablet Take 1 tablet (600 mg total) by mouth at bedtime. 01/06/23   Miguel Aschoff, MD  HYDROcodone-acetaminophen (NORCO/VICODIN) 5-325 MG tablet Take 2 tablets by mouth 2 (two) times daily. 05/04/23   Miguel Aschoff, MD  insulin degludec (TRESIBA) 100 UNIT/ML FlexTouch Pen Inject 30 Units into the skin every morning. 04/30/23   Miguel Aschoff, MD  ipratropium (ATROVENT) 0.03 % nasal spray Instill 2 sprays into the nose 3 (three) times daily as directed 02/18/23   Miguel Aschoff, MD  ipratropium-albuterol (DUONEB) 0.5-2.5 (3) MG/3ML SOLN Inhale 3 mLs by nebulization every 6 (six) hours as needed (shortness of breath, wheezing). 04/03/22   Miguel Aschoff, MD  ketoconazole (NIZORAL) 2 % cream Apply 1 application topically at bedtime as needed for irritation. 09/08/22   [name], RPH-CPP  linaclotide Karlene Einstein) 72 MCG capsule Take 1 capsule (72 mcg total) by mouth daily before breakfast. 12/15/22 12/10/23  Miguel Aschoff, MD  metoprolol succinate (TOPROL XL) 25 MG 24 hr tablet Take 1 tablet (25 mg total) by mouth daily. 04/07/23   Miguel Aschoff, MD  omeprazole (PRILOSEC) 40 MG capsule Take 1 capsule (40 mg total) by mouth 2 (two) times daily. 01/29/23   Miguel Aschoff, MD  ondansetron (ZOFRAN) 4 MG tablet Take 1 tablet (4 mg total) by mouth daily as needed  for nausea or vomiting. 11/19/22 11/19/23  Kathleen Lime, MD  OXYGEN Inhale 2 L into the lungs at bedtime.    [provider]  potassium chloride (KLOR-CON M) 10 MEQ tablet Take 1 tablet (10 mEq total) by mouth daily. 03/12/23   Miguel Aschoff, MD  ramelteon (ROZEREM) 8 MG tablet Take 1 tablet (8 mg total) by mouth at bedtime. 01/29/23 01/29/24  Miguel Aschoff, MD  venlafaxine XR (EFFEXOR-XR) 150 MG 24 hr capsule Take 1 capsule (150 mg total) by mouth daily with breakfast. 04/07/23   Miguel Aschoff, MD  warfarin (COUMADIN) 2.5 MG tablet Take 1 tablet (2.5 mg total)  by mouth daily unless otherwise instructed 12/23/22   Miguel Aschoff, MD      Allergies    Dexilant [dexlansoprazole], Isovue [iopamidol], Lorazepam, Oxycontin [oxycodone], and Tramadol hcl    Review of Systems   Review of Systems  Constitutional:  Negative for fever.  Respiratory:  Positive for cough and shortness of breath.   Cardiovascular:  Positive for chest pain. Negative for leg swelling.  All other systems reviewed and are negative.   Physical Exam Updated Vital Signs BP (!) 183/112   Pulse 81   Temp (!) 97.4 F (36.3 C) (Oral)   Resp (!) 21   SpO2 99%  Physical Exam Vitals and nursing note reviewed.  Constitutional:      Appearance: She is well-developed. She is obese.  HENT:     Head: Normocephalic and atraumatic.  Eyes:     Pupils: Pupils are equal, round, and reactive to light.  Cardiovascular:     Rate and Rhythm: Normal rate and regular rhythm.     Heart sounds: Normal heart sounds.  Pulmonary:     Effort: Pulmonary effort is normal. No respiratory distress.     Breath sounds: Wheezing present.  Abdominal:     General: Bowel sounds are normal.     Palpations: Abdomen is soft.  Musculoskeletal:     Cervical back: Neck supple.  Skin:    General: Skin is warm and dry.  Neurological:     Mental Status: She is alert and oriented to person, place, and time.  Psychiatric:        Mood and Affect: Mood normal.     ED Results / Procedures / Treatments   Labs (all labs ordered are listed, but only abnormal results are displayed) Labs Reviewed  BASIC METABOLIC PANEL WITH GFR - Abnormal; Notable for the following components:      Result Value   Glucose, Bld 191 (*)    All other components within normal limits  CBC - Abnormal; Notable for the following components:   Hemoglobin 8.7 (*)    HCT 31.6 (*)    MCV 76.7 (*)    MCH 21.1 (*)    MCHC 27.5 (*)    RDW 18.9 (*)    All other components within normal limits  PROTIME-INR - Abnormal; Notable for  the following components:   Prothrombin Time 16.0 (*)    INR 1.3 (*)    All other components within normal limits  TROPONIN I (HIGH SENSITIVITY)  TROPONIN I (HIGH SENSITIVITY)    EKG None  Radiology CT Head Wo Contrast Result Date: 05/15/2023 CLINICAL DATA:  Hypertension with bad headache. Minor head trauma also reported. EXAM: CT HEAD WITHOUT CONTRAST TECHNIQUE: Contiguous axial images were obtained from the base of the skull through the vertex without intravenous contrast. RADIATION DOSE REDUCTION: This exam was performed according  to the departmental dose-optimization program which includes automated exposure control, adjustment of the mA and/or kV according to patient size and/or use of iterative reconstruction technique. COMPARISON:  Head CT 11/19/2022. FINDINGS: Brain: There is mild-to-moderate cerebral atrophy, mild atrophic ventriculomegaly and small-vessel disease of the cerebral white matter. There is only slight cerebellar atrophy. No cortical based acute infarct, hemorrhage or mass are seen. There is no midline shift. The basal cisterns are clear. There are tiny chronic lacunar infarcts in the bilateral basal ganglia. Scattered dystrophic dural calcifications along the falx. Vascular: Patchy calcific plaque both carotid siphons. No hyperdense central vessels. Skull: Negative for fractures or focal lesions. Sinuses/Orbits: Membrane disease and small retention cyst or polyp left maxillary sinus. Other sinuses and mastoid air cells are clear. There is an s shaped nasal septum. Chronic proptosis. Old lens replacements. No new orbital abnormality. Other: None. IMPRESSION: 1. No acute intracranial CT findings.  Chronic changes. 2. Chronic proptosis. 3. Left maxillary sinus disease. Electronically Signed   By: Almira Bar M.D.   On: 05/15/2023 06:19   DG Chest 2 View Result Date: 05/15/2023 CLINICAL DATA:  Chest pain EXAM: CHEST - 2 VIEW COMPARISON:  11/19/2022 FINDINGS: Stable  cardiomediastinal silhouette. Aortic atherosclerotic calcification. Sternotomy. Cardiac valve prosthesis. Plate and screw fixation right clavicle. Hyperinflation. Chronic bronchitic changes. No focal consolidation, pleural effusion, or pneumothorax. Remote right rib fractures. IMPRESSION: No acute findings.  Emphysema and chronic bronchitic changes. Electronically Signed   By: Minerva Fester M.D.   On: 05/15/2023 03:04    Procedures Procedures    Medications Ordered in ED Medications - No data to display  ED Course/ Medical Decision Making/ A&P Clinical Course as of 05/17/23 0242  Fri May 15, 2023  0542 On repeat evaluation, patient is complaining of a headache.  She states that she has had this headache since she fell 3 weeks ago.  She has not had any nausea or vomiting.  She is on blood thinners.  She was not evaluated at that time. [CH]    Clinical Course User Index [CH] Letisia Schwalb, Mayer Masker, MD                                 Medical Decision Making Amount and/or Complexity of Data Reviewed Labs: ordered. Radiology: ordered.  Risk Prescription drug management.   This patient presents to the ED for concern of chest pain, shortness of breath, this involves an extensive number of treatment options, and is a complaint that carries with it a high risk of complications and morbidity.  I considered the following differential and admission for this acute, potentially life threatening condition.  The differential diagnosis includes COPD, ACS, pneumonia, pneumothorax, PE  MDM:    This is a 73 year old female who presents with chest pain.  Has had nightly chest pain for the last 3 nights.  Very atypical in nature.  History of chronic respiratory failure and COPD.  Was wheezing upon arrival.  Patient was given Solu-Medrol, DuoNebs en route.  She got a headache with nitroglycerin.  Patient was treated for headache.  EKG shows no evidence of acute arrhythmia or ischemia.  Troponin x 2 negative.   Basic lab work reassuring.  Patient is on her home oxygen requirement without significant further wheezing.  Of note, she is reporting ongoing headache.  States that she has had a headache truly since she fell several weeks ago and is concerned about this.  CT  head does not show any acute or delayed bleed.  Discussed the results with the patient.  Will start prednisone for presumed COPD.  She will need to follow-up with her primary doctor.  Headache may be nitro related.  (Labs, imaging, consults)  Labs: I Ordered, and personally interpreted labs.  The pertinent results include: CBC, BMP, troponin x 2, PT/INR  Imaging Studies ordered: I ordered imaging studies including chest x-ray, CT head  I independently visualized and interpreted imaging. I agree with the radiologist interpretation  Additional history obtained from EMS.  External records from outside source obtained and reviewed including prior evaluations  Cardiac Monitoring: The patient was maintained on a cardiac monitor.  If on the cardiac monitor, I personally viewed and interpreted the cardiac monitored which showed an underlying rhythm of: Sinus  Reevaluation: After the interventions noted above, I reevaluated the patient and found that they have :improved  Social Determinants of Health:  lives independently  Disposition: discharge  Co morbidities that complicate the patient evaluation  Past Medical History:  Diagnosis Date   Abnormality of lung on CXR 02/02/2020   Nonspecific finding on CXR ordered by pulmonologist - c/w inflammation vs infection, f/u imaging suggested, Dr. Roxy Cedar office has been in communication about recommended next steps.   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   Non-bleeding when visualized on capsule endoscopy 06/30/2015    Arthritis    "lower back; hands"  (02/19/2018)   Asthma    Asymptomatic cholelithiasis 09/25/2015   Seen on CT scan 08/2015   Carotid artery stenosis; s/p R endarterectomy    s/p right endarterectomy (06/2010) Carotid US (07/2010):  Left: Moderate-to-severe (60-79%) calcific and non-calcific plaque origin and proximal ICA and ECA    Chronic congestive heart failure with left ventricular diastolic dysfunction (HCC) 10/21/2010   Chronic constipation 02/03/2011   Chronic daily headache 01/16/2014   Chronic iron deficiency anemia    Chronic low back pain 10/06/2012   Chronic venous insufficiency 08/04/2012   Closed fracture of one rib of left side 08/23/2021   ED visit after a fall 08/16/21    COPD (chronic obstructive pulmonary disease) with emphysema (HCC)    PFTs 2018: severe obstructive disease, insignif response to bronchiodilator, mild restriction parenchymal pattern, moderately severe diffusion defect. 2014  FEV1 0.92 (40%), ratio 69, 27% increase in FEV1 with BD, TLC 91%, severe airtrapping, DLCO49% On chronic home O2. Pulmonary rehab referral 05/2012    Dyspnea    Fibromyalgia 08/29/2010   Gastroesophageal reflux disease    History of blood transfusion    "several times"  (02/19/2018)   History of clear cell renal cell carcinoma (HCC), in remission 07/21/2011   s/p cryoablation of left RCC in 09/2011 by Dr. Fredia Sorrow. Followed by Dr. Retta Diones  [facility]) .     History of fracture of left hip 10/17/2022   History of hiatal hernia    History of mitral valve replacement with bioprosthetic valve due to mitral stenosis 2012   s/p MVR with a 27-mm pericardial porcine valve (Medtronic Mosaic valve, serial #[id number] on 09/20/10, Dr. Donata Clay)    History of obstructive sleep apnea, resolved 2013   resolved per sleep study 07/2019; no apnea, but did have desaturation.  CPAP no longer necessary.  Nocturnal polysomnography (06/2009): Moderate sleep apnea/ hypopnea syndrome , AHI 17.8 per hour with nonpositional hypopneas.  CPAP titration to 12 CWP, AHI 2.4  per hour. On nocturnal CPAP via a small resMed Quattro full-face mask with heated humidifier.    History of pneumonia    "once"  (02/19/2018)   History of seborrheic keratosis 09/28/2015   History of stroke without residual deficits 01/17/2022   Hyperlipidemia LDL goal < 100 11/20/2005   Internal and external hemorrhoids without complication 08/04/2012   Lesion of left native kidney 06/01/2020   Incidental finding on recent screening chest CT for lung ca ordered by Dr. Maple Hudson pulmonology - he has ordered a f/u renal U/S.    Lichen sclerosus of female genitalia 01/12/2017   Migraine    "none in years" (02/19/2018)   Mild cognitive impairment with memory loss 12/23/2021   Moderately severe major depression (HCC) 11/19/2005   Nocturnal hypoxia per sleep study 07/2019    Osteoporosis    DEXA 2016: T -2.7; DEXA (12/09/2011): L-spine T -3.7, left hip T -1.4 DEXA (12/2004): L-spine T -2.6, left hip -0.1    Paroxysmal atrial fibrillation (HCC) 10/22/2010   s/p Left atrial maze procedure for paroxysmal atrial fibrillation on 09/20/2010 by Dr Donata Clay.  Subsequent splenic infarct, decision was made to re-anticoagulate with coumadin, likely life-long as this is the most likely cause of the splenic infarct.    Personal history of colonic polyps 05/14/2011   Colonoscopy (05/2011): 4 mm adenomatous polyp excised endoscopically Colonoscopy (02/2002): Adenomatous polyp excised endoscopically    Personal history of renal cell carcinoma 09/12/2020   Pneumonia    Pulmonary hypertension due to chronic obstructive pulmonary disease (HCC) 04/25/2016   2014 TEE w PA peak pressure 46 mmHg, s/p MV replacement    Right nephrolithiasis, asymptomatic, incidental finding 09/06/2014   5 mm non-obstructing calculus seen on CT scan 09/05/2014    Right ventricular failure (HCC) 04/25/2016   Severe obesity (BMI 35.0-39.9) with comorbidity (HCC) 10/23/2011   Sleep apnea    Stage 2 chronic  kidney disease due to type 2 diabetes mellitus (HCC) 12/16/2018   CKD stage III     Tobacco abuse 07/28/2012   Type 2 diabetes mellitus with complication, with long-term current use of insulin (HCC)    Type 2 diabetes mellitus with diabetic neuropathy (HCC)    Weight loss due to medication 03/12/2023     Medicines Meds ordered this encounter  Medications   DISCONTD: metoprolol succinate (TOPROL-XL) 24 hr tablet 25 mg   predniSONE (DELTASONE) 20 MG tablet    Sig: Take 2 tablets (40 mg total) by mouth daily.    Dispense:  10 tablet    Refill:  0    I have reviewed the patients home medicines and have made adjustments as needed  Problem List / ED Course: Problem List Items Addressed This Visit       Other   Headache, chronic intermittent (Chronic)   Other Visit Diagnoses       Atypical chest pain    -  Primary   Relevant Orders   Ambulatory referral to Cardiology     Chronic obstructive pulmonary disease with acute exacerbation (HCC)       Relevant Medications   predniSONE (DELTASONE) 20 MG tablet                   Final Clinical Impression(s) / ED Diagnoses Final diagnoses:  Atypical chest pain  Chronic obstructive pulmonary disease with acute exacerbation (HCC)  Acute nonintractable headache, unspecified headache type    Rx / DC Orders ED Discharge Orders  Ordered    predniSONE (DELTASONE) 20 MG tablet  Daily        05/15/23 0654    Ambulatory referral to Cardiology        05/15/23 0654              Joei Frangos, Mayer Masker, MD 05/17/23 [phone]

## 2023-05-15 NOTE — ED Notes (Signed)
 Patient ambulated with assistance to bathroom. O2-92 patient hr was 98.

## 2023-05-15 NOTE — ED Notes (Signed)
 Patient reports she fell on 3/15. States she hit her head and takes coumadin. She did not come in to be seen.

## 2023-05-17 DIAGNOSIS — J449 Chronic obstructive pulmonary disease, unspecified: Secondary | ICD-10-CM | POA: Diagnosis not present

## 2023-05-17 DIAGNOSIS — J439 Emphysema, unspecified: Secondary | ICD-10-CM | POA: Diagnosis not present

## 2023-05-17 DIAGNOSIS — I270 Primary pulmonary hypertension: Secondary | ICD-10-CM | POA: Diagnosis not present

## 2023-05-17 DIAGNOSIS — I509 Heart failure, unspecified: Secondary | ICD-10-CM | POA: Diagnosis not present

## 2023-05-18 ENCOUNTER — Other Ambulatory Visit (HOSPITAL_COMMUNITY): Payer: Self-pay

## 2023-05-18 ENCOUNTER — Other Ambulatory Visit: Payer: Self-pay

## 2023-05-18 ENCOUNTER — Other Ambulatory Visit: Payer: Self-pay | Admitting: Dietician

## 2023-05-18 ENCOUNTER — Telehealth: Payer: Self-pay

## 2023-05-18 DIAGNOSIS — Z794 Long term (current) use of insulin: Secondary | ICD-10-CM

## 2023-05-18 DIAGNOSIS — D649 Anemia, unspecified: Secondary | ICD-10-CM

## 2023-05-18 DIAGNOSIS — D509 Iron deficiency anemia, unspecified: Secondary | ICD-10-CM

## 2023-05-18 MED ORDER — INSULIN PEN NEEDLE 32G X 4 MM MISC
3 refills | Status: AC
  Filled 2023-05-18: qty 100, 100d supply, fill #0
  Filled 2023-09-21 – 2023-12-01 (×3): qty 100, 100d supply, fill #1

## 2023-05-18 NOTE — Telephone Encounter (Signed)
 Needs some pen needles

## 2023-05-18 NOTE — Telephone Encounter (Signed)
 Pt texts me results of PST POC FS INR results from today = 1.1 (Target goal 2.0 - 2.5) on 15 mg TOTAL warfarin/wk. INCREASED to 16.25 mg/wk as follows:Su-2.5mg M.-2.5mg T-2.5mg W-2.5mg Th-2.5mg F-1.25mg Sa-2.5mg . Repeat INR 14APR25. Denies any missed doses. No new medications. Does endorse increased green leafy vegetable (as salads) intake. Given her nutritional status, I advised that if she is enjoying the salads, to continue--and I would increase the dose accordingly. She agrees to that approach. Repeat INR next Monday, May 25, 2023.

## 2023-05-18 NOTE — Transitions of Care (Post Inpatient/ED Visit) (Signed)
 05/18/2023  Name: [name] MRN: [id number] DOB: April 24, 1950  Today's TOC FU Call Status: Today's TOC FU Call Status:: Successful TOC FU Call Completed TOC FU Call Complete Date: 05/18/23 Patient's Name and Date of Birth confirmed.  Transition Care Management Follow-up Telephone Call Date of Discharge: 05/15/23 Discharge Facility: [facility]) Type of Discharge: Emergency Department Reason for ED Visit: Respiratory Respiratory Diagnosis: COPD Exacerbation How have you been since you were released from the hospital?: Better Any questions or concerns?: No  Items Reviewed: Did you receive and understand the discharge instructions provided?: Yes Medications obtained,verified, and reconciled?: Yes (Medications Reviewed) Any new allergies since your discharge?: No Dietary orders reviewed?: Yes Do you have support at home?: No  Medications Reviewed Today: Medications Reviewed Today     Reviewed by Karena Addison, LPN (Licensed Practical Nurse) on 05/18/23 at 1552  Med List Status: <None>   Medication Order Taking? Sig Documenting Provider Last Dose Status Informant  albuterol (VENTOLIN HFA) 108 (90 Base) MCG/ACT inhaler [id number] No Inhale 2 puffs into the lungs every 6 hours as needed for shortness of breath Miguel Aschoff, MD Taking Active   ALPRAZolam Prudy Feeler) 0.5 MG tablet [id number] No Take 1 tablet (0.5 mg total) by mouth at bedtime. To help with sleep. Miguel Aschoff, MD Taking Active   buPROPion (WELLBUTRIN XL) 150 MG 24 hr tablet [id number] No Take 1 tablet (150 mg total) by mouth every morning. Miguel Aschoff, MD Taking Active   Continuous Glucose Sensor (FREESTYLE LIBRE 3 PLUS SENSOR) [place] [id number] No Change sensor every 15 days. Miguel Aschoff, MD Taking Active   Continuous Glucose Sensor (FREESTYLE LIBRE 3 SENSOR) [place] [id number] No Place 1 sensor on the skin every 14 days. Use to check glucose 6 times daily as directed Miguel Aschoff, MD Taking Active   Fluticasone-Umeclidin-[facility] ELLIPTA) 100-62.5-25 MCG/ACT AEPB [id number] No Inhale 1 puff into the lungs daily. Oretha Milch, MD Taking Active   furosemide (LASIX) 40 MG tablet [id number] No Take 0.5 tablets (20 mg total) by mouth daily. Miguel Aschoff, MD Taking Active   gabapentin (NEURONTIN) 600 MG tablet [id number] No Take 1 tablet (600 mg total) by mouth at bedtime. Miguel Aschoff, MD Taking Active   HYDROcodone-acetaminophen (NORCO/VICODIN) 5-325 MG tablet [id number] No Take 2 tablets by mouth 2 (two) times daily. Miguel Aschoff, MD Taking Active   insulin degludec (TRESIBA) 100 UNIT/ML FlexTouch Pen [id number] No Inject 30 Units into the skin every morning. Miguel Aschoff, MD Taking Active   Insulin Pen Needle 32G X 4 MM MISC [id number]  Use to inject insulin daily Plyler, Cecil Cranker, RD  Active   ipratropium (ATROVENT) 0.03 % nasal spray [id number] No Instill 2 sprays into the nose 3 (three) times daily as directed Miguel Aschoff, MD Taking Active   ipratropium-albuterol (DUONEB) 0.5-2.5 (3) MG/3ML SOLN [id number] No Inhale 3 mLs by nebulization every 6 (six) hours as needed (shortness of breath, wheezing). Miguel Aschoff, MD Taking Active Self, Pharmacy Records  ketoconazole (NIZORAL) 2 % cream [id number] No Apply 1 application topically at bedtime as needed for irritation. [name], RPH-CPP Taking Active Self, Pharmacy Records  linaclotide [facility]) 72 MCG capsule [id number] No Take 1 capsule (72 mcg total) by mouth daily before breakfast. Miguel Aschoff, MD Taking Active   metoprolol succinate (TOPROL XL) 25 MG 24 hr tablet [id number] No Take 1 tablet (25 mg total) by mouth daily. Miguel Aschoff, MD Taking  Active   omeprazole (PRILOSEC) 40 MG capsule [id number] No Take 1 capsule (40 mg total) by mouth 2 (two) times daily. Miguel Aschoff, MD Taking Active   ondansetron [facility]) 4 MG tablet [id number] No Take 1  tablet (4 mg total) by mouth daily as needed for nausea or vomiting. Kathleen Lime, MD Taking Active   OXYGEN [id number] No Inhale 2 L into the lungs at bedtime. [provider] Taking Active Self, Pharmacy Records  potassium chloride (KLOR-CON M) 10 MEQ tablet [id number] No Take 1 tablet (10 mEq total) by mouth daily. Miguel Aschoff, MD Taking Active   predniSONE (DELTASONE) 20 MG tablet [id number]  Take 2 tablets (40 mg total) by mouth daily. Horton, Mayer Masker, MD  Active   ramelteon (ROZEREM) 8 MG tablet [id number] No Take 1 tablet (8 mg total) by mouth at bedtime. Miguel Aschoff, MD Taking Active   venlafaxine XR (EFFEXOR-XR) 150 MG 24 hr capsule [id number] No Take 1 capsule (150 mg total) by mouth daily with breakfast. Miguel Aschoff, MD Taking Active   warfarin (COUMADIN) 2.5 MG tablet [id number] No Take 1 tablet (2.5 mg total) by mouth daily unless otherwise instructed Miguel Aschoff, MD Taking Active             Home Care and Equipment/Supplies: Were Home Health Services Ordered?: NA Any new equipment or medical supplies ordered?: NA  Functional Questionnaire: Do you need assistance with bathing/showering or dressing?: No Do you need assistance with meal preparation?: No Do you need assistance with eating?: No Do you have difficulty maintaining continence: No Do you need assistance with getting out of bed/getting out of a chair/moving?: No Do you have difficulty managing or taking your medications?: No  Follow up appointments reviewed: PCP Follow-up appointment confirmed?: No (already has appt 4/22, just wants to keep this appt) MD Provider Line Number:[phone] Given: No Specialist Hospital Follow-up appointment confirmed?: NA Do you need transportation to your follow-up appointment?: No Do you understand care options if your condition(s) worsen?: Yes-patient verbalized understanding    SIGNATURE Karena Addison, LPN [facility] Nurse Health  Advisor Direct Dial [phone]

## 2023-05-19 ENCOUNTER — Other Ambulatory Visit: Payer: Self-pay | Admitting: Internal Medicine

## 2023-05-19 ENCOUNTER — Inpatient Hospital Stay: Payer: PPO | Attending: Hematology | Admitting: Hematology

## 2023-05-19 ENCOUNTER — Other Ambulatory Visit (HOSPITAL_COMMUNITY): Payer: Self-pay

## 2023-05-19 ENCOUNTER — Ambulatory Visit: Payer: Self-pay

## 2023-05-19 ENCOUNTER — Inpatient Hospital Stay: Payer: PPO

## 2023-05-19 VITALS — BP 137/47 | HR 73 | Temp 98.1°F | Resp 18 | Wt 141.5 lb

## 2023-05-19 DIAGNOSIS — D649 Anemia, unspecified: Secondary | ICD-10-CM

## 2023-05-19 DIAGNOSIS — D500 Iron deficiency anemia secondary to blood loss (chronic): Secondary | ICD-10-CM

## 2023-05-19 DIAGNOSIS — D509 Iron deficiency anemia, unspecified: Secondary | ICD-10-CM | POA: Insufficient documentation

## 2023-05-19 DIAGNOSIS — R296 Repeated falls: Secondary | ICD-10-CM | POA: Insufficient documentation

## 2023-05-19 DIAGNOSIS — I4891 Unspecified atrial fibrillation: Secondary | ICD-10-CM | POA: Insufficient documentation

## 2023-05-19 DIAGNOSIS — Z7901 Long term (current) use of anticoagulants: Secondary | ICD-10-CM | POA: Insufficient documentation

## 2023-05-19 DIAGNOSIS — Z85528 Personal history of other malignant neoplasm of kidney: Secondary | ICD-10-CM | POA: Diagnosis not present

## 2023-05-19 LAB — CBC WITH DIFFERENTIAL (CANCER CENTER ONLY)
Abs Immature Granulocytes: 0.03 10*3/uL (ref 0.00–0.07)
Basophils Absolute: 0.1 10*3/uL (ref 0.0–0.1)
Basophils Relative: 1 %
Eosinophils Absolute: 0 10*3/uL (ref 0.0–0.5)
Eosinophils Relative: 1 %
HCT: 29.9 % — ABNORMAL LOW (ref 36.0–46.0)
Hemoglobin: 8.3 g/dL — ABNORMAL LOW (ref 12.0–15.0)
Immature Granulocytes: 0 %
Lymphocytes Relative: 12 %
Lymphs Abs: 0.9 10*3/uL (ref 0.7–4.0)
MCH: 21 pg — ABNORMAL LOW (ref 26.0–34.0)
MCHC: 27.8 g/dL — ABNORMAL LOW (ref 30.0–36.0)
MCV: 75.5 fL — ABNORMAL LOW (ref 80.0–100.0)
Monocytes Absolute: 0.6 10*3/uL (ref 0.1–1.0)
Monocytes Relative: 8 %
Neutro Abs: 6 10*3/uL (ref 1.7–7.7)
Neutrophils Relative %: 78 %
Platelet Count: 235 10*3/uL (ref 150–400)
RBC: 3.96 MIL/uL (ref 3.87–5.11)
RDW: 18.9 % — ABNORMAL HIGH (ref 11.5–15.5)
WBC Count: 7.6 10*3/uL (ref 4.0–10.5)
nRBC: 0 % (ref 0.0–0.2)

## 2023-05-19 LAB — IRON AND IRON BINDING CAPACITY (CC-WL,HP ONLY)
Iron: 17 ug/dL — ABNORMAL LOW (ref 28–170)
Saturation Ratios: 4 % — ABNORMAL LOW (ref 10.4–31.8)
TIBC: 463 ug/dL — ABNORMAL HIGH (ref 250–450)
UIBC: 446 ug/dL — ABNORMAL HIGH (ref 148–442)

## 2023-05-19 LAB — CMP (CANCER CENTER ONLY)
ALT: 9 U/L (ref 0–44)
AST: 9 U/L — ABNORMAL LOW (ref 15–41)
Albumin: 3.7 g/dL (ref 3.5–5.0)
Alkaline Phosphatase: 50 U/L (ref 38–126)
Anion gap: 4 — ABNORMAL LOW (ref 5–15)
BUN: 14 mg/dL (ref 8–23)
CO2: 31 mmol/L (ref 22–32)
Calcium: 9 mg/dL (ref 8.9–10.3)
Chloride: 103 mmol/L (ref 98–111)
Creatinine: 0.69 mg/dL (ref 0.44–1.00)
GFR, Estimated: >60 mL/min (ref >60)
Glucose, Bld: 210 mg/dL — ABNORMAL HIGH (ref 70–99)
Potassium: 4.1 mmol/L (ref 3.5–5.1)
Sodium: 138 mmol/L (ref 135–145)
Total Bilirubin: 0.5 mg/dL (ref 0.0–1.2)
Total Protein: 6.2 g/dL — ABNORMAL LOW (ref 6.5–8.1)

## 2023-05-19 LAB — LACTATE DEHYDROGENASE: LDH: 238 U/L — ABNORMAL HIGH (ref 98–192)

## 2023-05-19 LAB — FERRITIN: Ferritin: 7 ng/mL — ABNORMAL LOW (ref 11–307)

## 2023-05-19 IMAGING — MG MM DIGITAL SCREENING BILAT W/ TOMO AND CAD
8 series · 8 of 24 positions shown · non-contrast
Comparison: Previous exam(s).

CLINICAL DATA: Screening.

EXAM:
DIGITAL SCREENING BILATERAL MAMMOGRAM WITH TOMOSYNTHESIS AND CAD
TECHNIQUE: Bilateral screening digital craniocaudal and mediolateral oblique
mammograms were obtained. Bilateral screening digital breast
tomosynthesis was performed. The images were evaluated with
computer-aided detection.

[L MLO synth-2D]
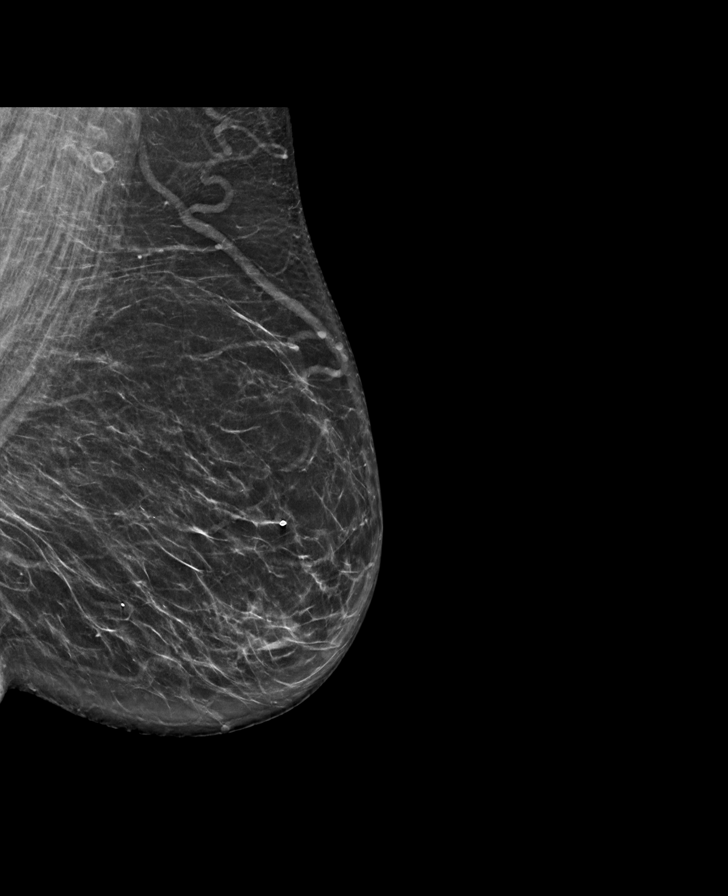

[L CC synth-2D]
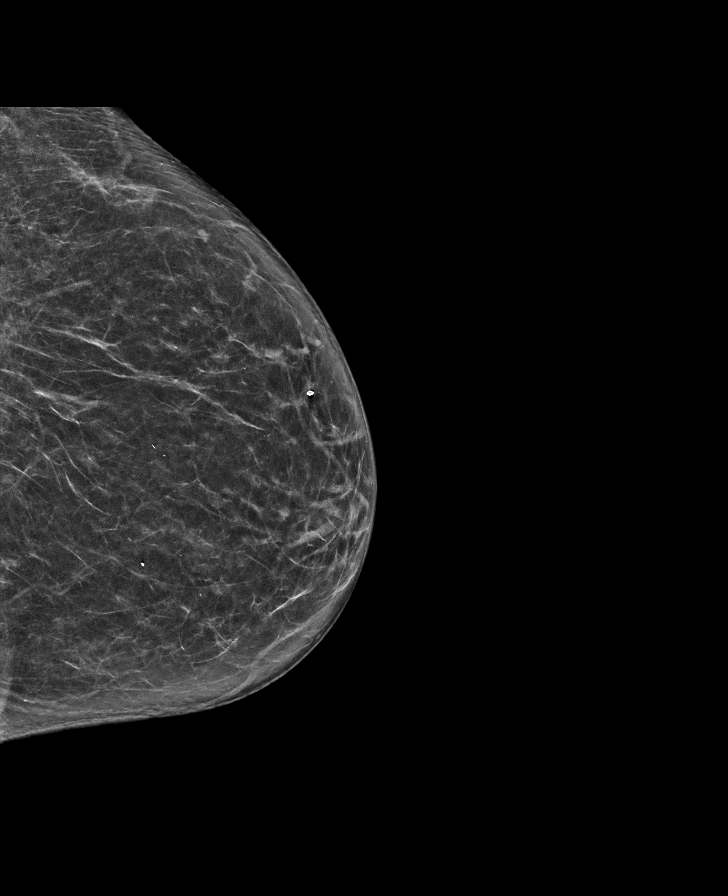

[R CC synth-2D]
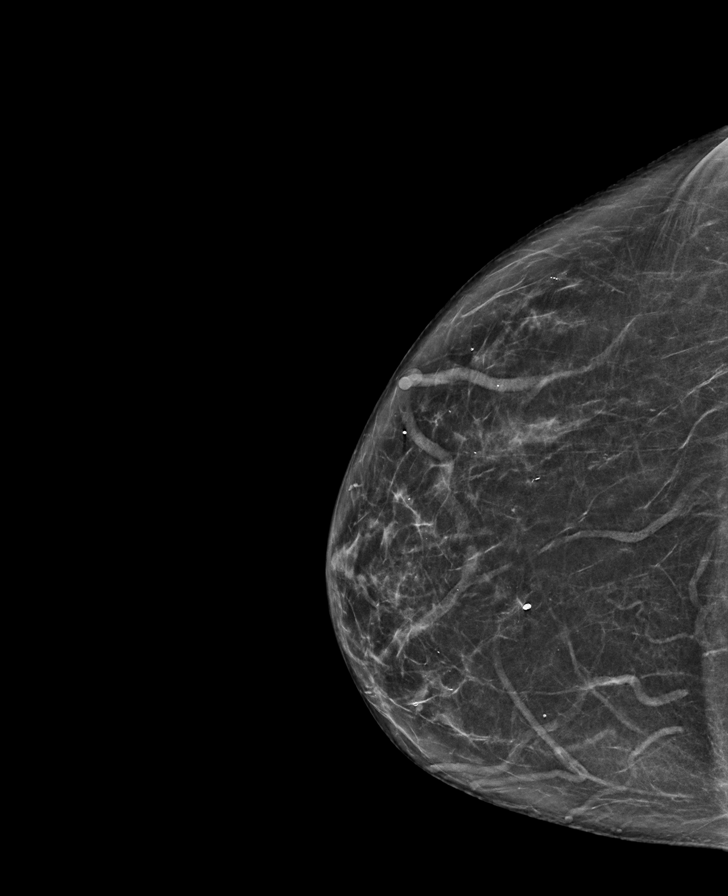

[R MLO synth-2D]
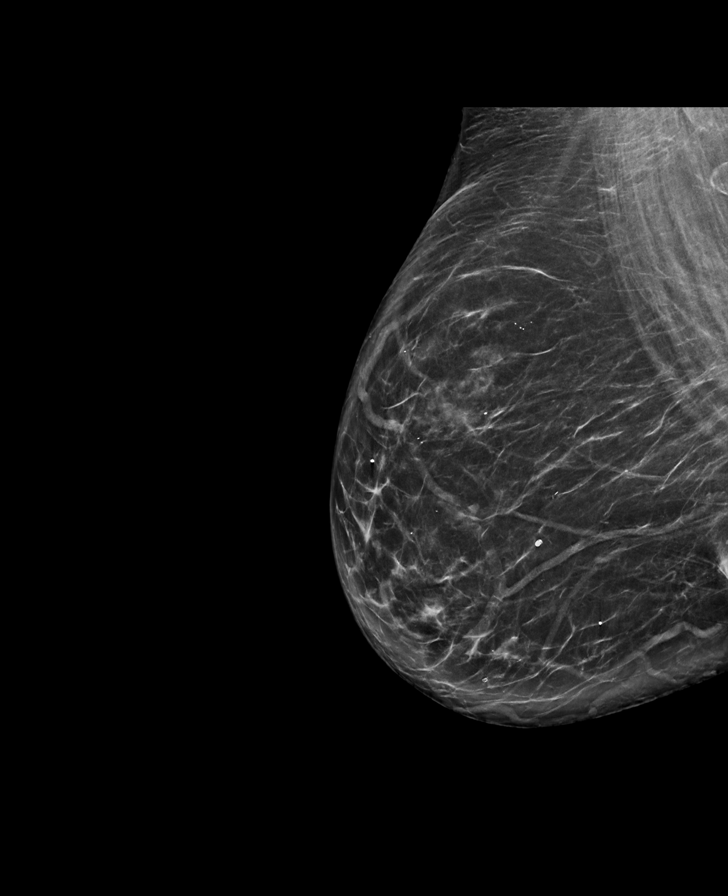

[R CC tomo · tomo slice 34/67.0]
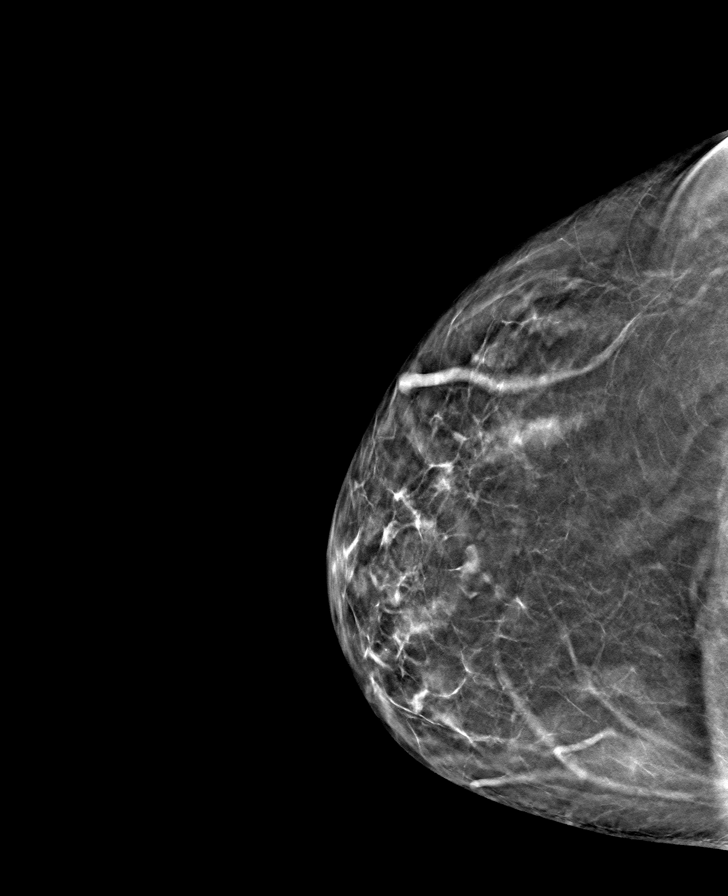

[L MLO tomo · tomo slice 36/71.0]
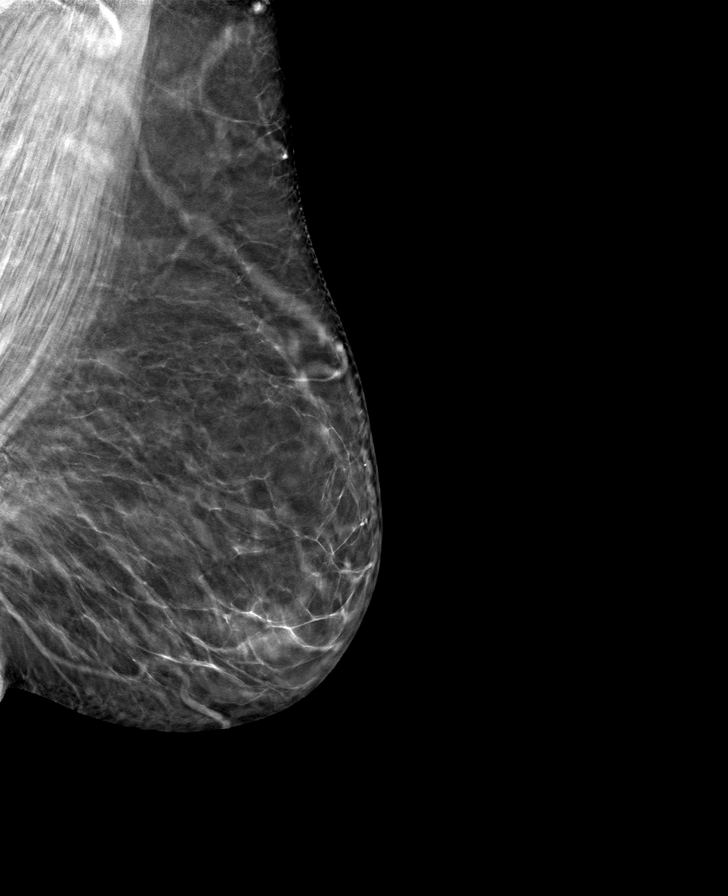

[R MLO tomo · tomo slice 38/75.0]
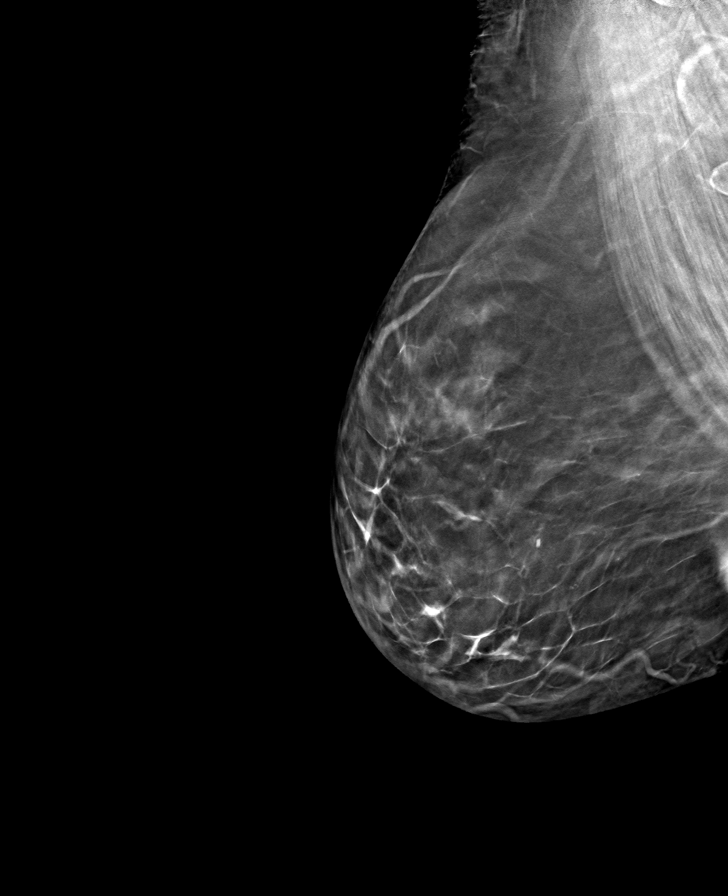

[L CC tomo · tomo slice 33/66.0]
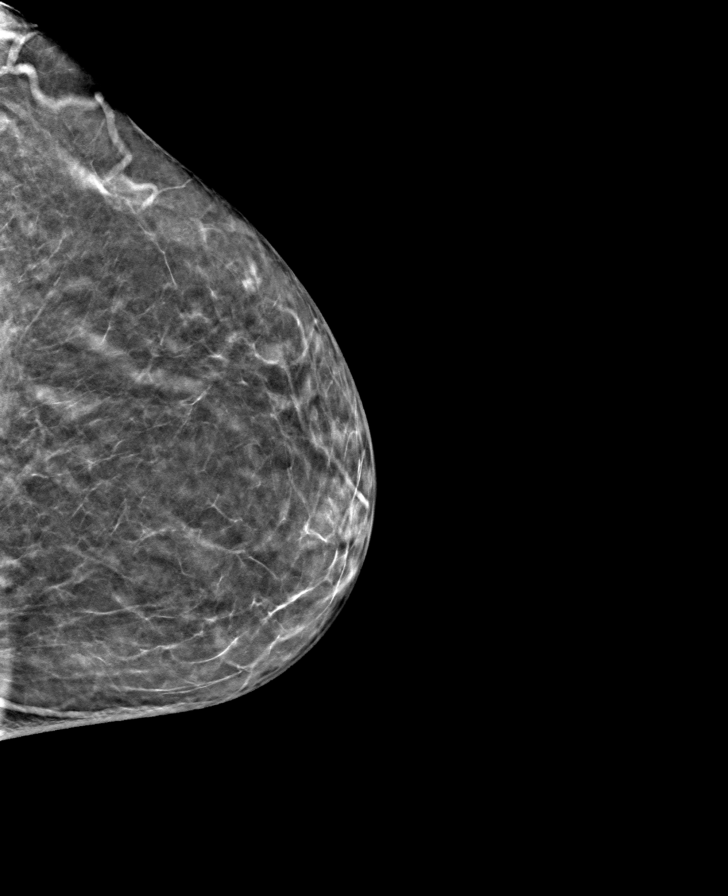

[8 of 24 positions shown; findings below may reference images not displayed]

ACR Breast Density Category b: There are scattered areas of
fibroglandular density.
FINDINGS: There are no findings suspicious for malignancy.
IMPRESSION: No mammographic evidence of malignancy. A result letter of this
screening mammogram will be mailed directly to the patient.

RECOMMENDATION:
Screening mammogram in one year. (Code:51-O-LD2)

BI-RADS CATEGORY  1: Negative.

## 2023-05-19 NOTE — Telephone Encounter (Signed)
 Called pt about increase swelling and to schedule a sooner appt - no answer; left message to call the office.

## 2023-05-19 NOTE — Progress Notes (Signed)
 HEMATOLOGY/ONCOLOGY CLINIC NOTE  Date of Service: 05/19/2023  Patient Care Team: Sherol Dixie, MD as PCP - General (Internal Medicine) Darlis Eisenmenger, MD as PCP - Advanced Heart Failure (Cardiology) Plyler, Melodie Spry, RD as Dietitian (Dietician) Maris Sickle, MD as Consulting Physician (Ophthalmology) Delilah Fend, MD (Inactive) as Consulting Physician (Gastroenterology) (Rounding), Imts Zilphia Hilt, MD as Attending Physician Orren Blades, OTR as Occupational Therapist (Occupational Therapy)  CHIEF COMPLAINTS/PURPOSE OF CONSULTATION:  evaluation and management of chronic iron deficiency anemia.  HISTORY OF PRESENTING ILLNESS:   [name] is a wonderful 73 y.o. female who is here for evaluation and management of chronic iron deficiency anemia.  Today, she is accompanied by her sister and presents in a wheelchair. Patient reports that she was seen by a different hematologist previously several years ago. Patient reports having anemia all her life. She reports that her hgb does go down multiple times, requiring her to present to the hospital to receive blood and iron.  Patient denies any concerns for bleeding, such as nose bleeds, gum bleeds, black stools,or blood in the stools.   She reports having multiple falls with balance issues and reports a fractured right wrist and hip. She reports that her hip had contact with a chair and she could barely walk for some time.  She fractured her hip in September and did have hip surgery. Unfortunately, while in a nursing home for rehab, she suffered a fall and fractured her right wrist. Patient reports that she was told to see a neurologist to evaluate her balance issues/falls and she is working on scheduling an appt. Her sister reports that patient has had balance issues for a while.  She reports that she felt that she was over-medicated white being treated in the nursing facility following her hip surgery. She describes  symptoms including paleness, lethargy, and confusion.   Her sister reports that patient continues to smoke more than a pack a day.  Patient reports a history of kidney cancer diagnosed 8-10 years ago. She reports that she had her first Cryoablation at that time and her second one five years later by Dr. Nereida Banning. Patient reports that she required a shot every month. She does not follow a Insurance underwriter. She follows with an interventional radiologist.    Patient reports that she was previously told that she did not need to use a CPAP machine 6 months ago. She was told that she does not have sleep apnea. She generally uses at-home oxygen only when she uses her CPAP machine, which she typically uses everytime she sleeps. However, patient has issues with CPAP set up and has not used the CPAP machine recently.   She reports a hx of heart surgery. She is on coumadin chronically for afib.   Patient has required blood transfusions twice a year over the last 5-7 years. Patient has not received erythropoietin shots recently. Over the last 5-7 years, her anemia has been managed by her PCP.   She reports that she was told by hospital staff that she cannot receive iron infusions due to reactions. She had iron infusions 2-3 times in a hospital setting and not on an elective basis as outpatient. With her reaction to IV iron, her blood pressure dropped and she was given benedryl after the reaction.   Patient does take Prilosec.  She reports that she has lost 55 pounds in 2-3 years.   She denies any other changes over the last 6 months.   She reports  that she lives at home at this time.  INTERVAL HISTORY: [name] is a wonderful 73 y.o. female who is here for continued evaluation and management of chronic iron deficiency anemia..   I last connected with the patient, via tele-me visit, on 01/20/2023 and she was doing well overall.   Patient is accompanied by her family member during this visit.  Patient notes she has been doing fairly well since our last visit. She complains of fatigue, mild chest pain, bilateral leg pain with swelling.   She has not been taking oral Iron supplement because it was causing her constipation. She tried stool softener, which did not help her constipation.   Patient notes she previously had IV Iron infusion, which caused her side effects. She is unsure of the IV Iron regimen.   She denies any new infection issues, fever, chills, night sweats, unexpected weight loss, back pan, black stools, blood in stools, hematuria, or leg swelling. She also complains of constipation during this visit.   She is currently taking Warfarin and Prilosec. Denies aspirin.   MEDICAL HISTORY:  Past Medical History:  Diagnosis Date   Abnormality of lung on CXR 02/02/2020   Nonspecific finding on CXR ordered by pulmonologist - c/w inflammation vs infection, f/u imaging suggested, Dr. Antonette Batters office has been in communication about recommended next steps.   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   Non-bleeding when visualized on capsule endoscopy 06/30/2015    Arthritis    "lower back; hands" (02/19/2018)   Asthma    Asymptomatic cholelithiasis 09/25/2015   Seen on CT scan 08/2015   Carotid artery stenosis; s/p R endarterectomy    s/p right endarterectomy (06/2010) Carotid US  (07/2010):  Left: Moderate-to-severe (60-79%) calcific and non-calcific plaque origin and proximal ICA and ECA    Chronic congestive heart failure with left ventricular diastolic dysfunction (HCC) 10/21/2010   Chronic constipation 02/03/2011   Chronic daily headache 01/16/2014   Chronic iron deficiency anemia    Chronic low back pain 10/06/2012   Chronic venous insufficiency 08/04/2012   Closed fracture of one rib of left side 08/23/2021   ED visit after a fall 08/16/21    COPD  (chronic obstructive pulmonary disease) with emphysema (HCC)    PFTs 2018: severe obstructive disease, insignif response to bronchiodilator, mild restriction parenchymal pattern, moderately severe diffusion defect. 2014  FEV1 0.92 (40%), ratio 69, 27% increase in FEV1 with BD, TLC 91%, severe airtrapping, DLCO49% On chronic home O2. Pulmonary rehab referral 05/2012    Dyspnea    Fibromyalgia 08/29/2010   Gastroesophageal reflux disease    History of blood transfusion    "several times"  (02/19/2018)   History of clear cell renal cell carcinoma (HCC), in remission 07/21/2011   s/p cryoablation of left RCC in 09/2011 by Dr. Nereida Banning. Followed by Dr. Joie Narrow  [facility]) .     History of fracture of left hip 10/17/2022   History of hiatal hernia    History of mitral valve replacement with bioprosthetic valve due to mitral stenosis 2012   s/p MVR with a 27-mm pericardial porcine valve (Medtronic Mosaic valve, serial #[id number] on 09/20/10, Dr. Matt Song)    History of obstructive sleep apnea, resolved 2013   resolved per sleep study 07/2019; no apnea, but did have desaturation.  CPAP no longer necessary.  Nocturnal polysomnography (06/2009): Moderate sleep apnea/ hypopnea syndrome , AHI 17.8  per hour with nonpositional hypopneas. CPAP titration to 12 CWP, AHI 2.4 per hour. On nocturnal CPAP via a small resMed Quattro full-face mask with heated humidifier.    History of pneumonia    "once"  (02/19/2018)   History of seborrheic keratosis 09/28/2015   History of stroke without residual deficits 01/17/2022   Hyperlipidemia LDL goal < 100 11/20/2005   Internal and external hemorrhoids without complication 08/04/2012   Lesion of left native kidney 06/01/2020   Incidental finding on recent screening chest CT for lung ca ordered by Dr. Linder Revere pulmonology - he has ordered a f/u renal U/S.    Lichen sclerosus of female genitalia 01/12/2017   Migraine    "none in years" (02/19/2018)   Mild cognitive  impairment with memory loss 12/23/2021   Moderately severe major depression (HCC) 11/19/2005   Nocturnal hypoxia per sleep study 07/2019    Osteoporosis    DEXA 2016: T -2.7; DEXA (12/09/2011): L-spine T -3.7, left hip T -1.4 DEXA (12/2004): L-spine T -2.6, left hip -0.1    Paroxysmal atrial fibrillation (HCC) 10/22/2010   s/p Left atrial maze procedure for paroxysmal atrial fibrillation on 09/20/2010 by Dr Matt Song.  Subsequent splenic infarct, decision was made to re-anticoagulate with coumadin, likely life-long as this is the most likely cause of the splenic infarct.    Personal history of colonic polyps 05/14/2011   Colonoscopy (05/2011): 4 mm adenomatous polyp excised endoscopically Colonoscopy (02/2002): Adenomatous polyp excised endoscopically    Personal history of renal cell carcinoma 09/12/2020   Pneumonia    Pulmonary hypertension due to chronic obstructive pulmonary disease (HCC) 04/25/2016   2014 TEE w PA peak pressure 46 mmHg, s/p MV replacement    Right nephrolithiasis, asymptomatic, incidental finding 09/06/2014   5 mm non-obstructing calculus seen on CT scan 09/05/2014    Right ventricular failure (HCC) 04/25/2016   Severe obesity (BMI 35.0-39.9) with comorbidity (HCC) 10/23/2011   Sleep apnea    Stage 2 chronic kidney disease due to type 2 diabetes mellitus (HCC) 12/16/2018   CKD stage III     Tobacco abuse 07/28/2012   Type 2 diabetes mellitus with complication, with long-term current use of insulin (HCC)    Type 2 diabetes mellitus with diabetic neuropathy (HCC)    Weight loss due to medication 03/12/2023    SURGICAL HISTORY: Past Surgical History:  Procedure Laterality Date   BIOPSY  07/19/2021   Procedure: BIOPSY;  Surgeon: Ozell Blunt, MD;  Location: [facility] ENDOSCOPY;  Service: Gastroenterology;;   CARDIAC CATHETERIZATION     CARDIAC VALVE REPLACEMENT  Aug. 2012   "mitral valve"   CAROTID ENDARTERECTOMY Right 07/04/2010   by Dr. [name] Cookey for asymptomatic right carotid  artery stenosis   CATARACT EXTRACTION W/ INTRAOCULAR LENS  IMPLANT, BILATERAL Bilateral    CHEST TUBE INSERTION  09/24/2010   Dr Carloyn Chi Trigt   COLONOSCOPY  05/12/2011   performed by Dr. Honey Lusty. Showing small internal hemorrhoids, single tubular adenoma polyp   COLONOSCOPY WITH PROPOFOL N/A 05/29/2020   Procedure: COLONOSCOPY WITH PROPOFOL;  Surgeon: Ozell Blunt, MD;  Location: [facility];  Service: Endoscopy;  Laterality: N/A;   COLONOSCOPY WITH PROPOFOL N/A 07/19/2021   Procedure: COLONOSCOPY WITH PROPOFOL;  Surgeon: Ozell Blunt, MD;  Location: [facility];  Service: Gastroenterology;  Laterality: N/A;   CRYOABLATION Left 09/2011   by Dr. Nereida Banning. Followed by Dr. Joie Narrow  [facility]) .     DILATION AND CURETTAGE OF UTERUS     ESOPHAGOGASTRODUODENOSCOPY  05/12/2011  performed by Dr. Honey Lusty. Negative for ulcerations, biopsy negative for evidence of celiac sprue   ESOPHAGOGASTRODUODENOSCOPY N/A 06/29/2015   Procedure: ESOPHAGOGASTRODUODENOSCOPY (EGD);  Surgeon: Ozell Blunt, MD;  Location: [facility] ENDOSCOPY;  Service: Endoscopy;  Laterality: N/A;   ESOPHAGOGASTRODUODENOSCOPY N/A 03/29/2016   Procedure: ESOPHAGOGASTRODUODENOSCOPY (EGD);  Surgeon: Ozell Blunt, MD;  Location: [facility] ENDOSCOPY;  Service: Endoscopy;  Laterality: N/A;   ESOPHAGOGASTRODUODENOSCOPY (EGD) WITH PROPOFOL N/A 05/29/2020   Procedure: ESOPHAGOGASTRODUODENOSCOPY (EGD) WITH PROPOFOL;  Surgeon: Ozell Blunt, MD;  Location: [facility];  Service: Endoscopy;  Laterality: N/A;   FRACTURE SURGERY     GIVENS CAPSULE STUDY N/A 06/30/2015   Procedure: GIVENS CAPSULE STUDY;  Surgeon: Ozell Blunt, MD;  Location: [facility] ENDOSCOPY;  Service: Endoscopy;  Laterality: N/A;   GIVENS CAPSULE STUDY N/A 06/29/2015   Procedure: GIVENS CAPSULE STUDY;  Surgeon: Ozell Blunt, MD;  Location: [facility] ENDOSCOPY;  Service: Endoscopy;  Laterality: N/A;   HEMORRHOID SURGERY  1970s?   "lanced"   HYSTEROSCOPY W/ ENDOMETRIAL ABLATION  06/2001   for persistent post-menopausal  bleeding // by S. Saint Cranker, M.D.   INTRAMEDULLARY (IM) NAIL INTERTROCHANTERIC Left 10/19/2022   Procedure: LEFT INTRAMEDULLARY (IM) NAIL INTERTROCHANTERIC;  Surgeon: Gaylon Kea, MD;  Location: [facility] OR;  Service: Orthopedics;  Laterality: Left;   IR GENERIC HISTORICAL  08/23/2015   IR RADIOLOGIST EVAL & MGMT 08/23/2015 Erica Hau, MD [facility] INTERV RAD   IR GENERIC HISTORICAL  04/09/2016   IR RADIOLOGIST EVAL & MGMT 04/09/2016 Erica Hau, MD [facility] INTERV RAD   IR RADIOLOGIST EVAL & MGMT  10/07/2016   IR RADIOLOGIST EVAL & MGMT  06/25/2017   IR RADIOLOGIST EVAL & MGMT  07/31/2020   IR RADIOLOGIST EVAL & MGMT  10/02/2020   IR RADIOLOGIST EVAL & MGMT  12/20/2020   IR RADIOLOGIST EVAL & MGMT  12/25/2021   IR RADIOLOGIST EVAL & MGMT  04/16/2023   LEFT HEART CATH AND CORONARY ANGIOGRAPHY N/A 04/21/2016   Procedure: Left Heart Cath and Coronary Angiography;  Surgeon: Avanell Leigh, MD;  Location: [facility] INVASIVE CV LAB;  Service: Cardiovascular;  Laterality: N/A;   LIPOMA EXCISION  08/2005   occipital lipoma 1.5cm - by Dr. Corliss Dies   LITHOTRIPSY  ~ 2000   MAZE Left 09/20/10   for paroxysmal atrial fibrillation (Dr. Matt Song)   MITRAL VALVE REPLACEMENT  09/20/10    with a 27-mm pericardial porcine valve (Medtronic Mosaic valve, serial #[id number]). 09/20/10, Dr Matt Song   ORIF CLAVICLE FRACTURE Right 01/2004   by Myrtle Atta. Murrel Arnt, M.D for Right clavicle nonunion.; "it's got a pin in it"   POLYPECTOMY  05/29/2020   Procedure: POLYPECTOMY;  Surgeon: Ozell Blunt, MD;  Location: [facility] ENDOSCOPY;  Service: Endoscopy;;   POLYPECTOMY  07/19/2021   Procedure: POLYPECTOMY;  Surgeon: Ozell Blunt, MD;  Location: [facility] ENDOSCOPY;  Service: Gastroenterology;;   RADIOLOGY WITH ANESTHESIA Left 09/12/2020   Procedure: RADIOLOGY WITH ANESTHESIA- RENAL CRYOABLATION;  Surgeon: Erica Hau, MD;  Location: [facility] ORS;  Service: Radiology;  Laterality: Left;   REFRACTIVE SURGERY Bilateral    RIGHT HEART CATH N/A 04/23/2016    Procedure: Right Heart Cath;  Surgeon: Darlis Eisenmenger, MD;  Location: [facility] INVASIVE CV LAB;  Service: Cardiovascular;  Laterality: N/A;   TONSILLECTOMY     TUBAL LIGATION      SOCIAL HISTORY: Social History   Socioeconomic History   Marital status: Divorced    Spouse name: Not on file   Number of children: Not on file   Years  of education: Not on file   Highest education level: Not on file  Occupational History   Not on file  Tobacco Use   Smoking status: Every Day    Current packs/day: 1.00    Average packs/day: 1 pack/day for 60.5 years (60.5 ttl pk-yrs)    Types: Cigarettes    Start date: 11/01/1962   Smokeless tobacco: Never   Tobacco comments:    At least 1 PPD  Vaping Use   Vaping status: Never Used  Substance and Sexual Activity   Alcohol use: Not Currently    Comment: 02/19/2018 "nothing since 1999"   Drug use: Not Currently    Types: Marijuana    Comment: 02/19/2018 "nothing since the 1990s"   Sexual activity: Not Currently    Birth control/protection: Post-menopausal  Other Topics Concern   Not on file  Social History Narrative   Lives alone in [place] ([address])   Worked at [employer] for 18 years   No car   Social Drivers of Corporate investment banker Strain: Low Risk  (04/08/2023)   Overall Financial Resource Strain (CARDIA)    Difficulty of Paying Living Expenses: Not hard at all  Food Insecurity: Food Insecurity Present (04/08/2023)   Hunger Vital Sign    Worried About Running Out of Food in the Last Year: Never true    Ran Out of Food in the Last Year: Sometimes true  Transportation Needs: Unmet Transportation Needs (04/08/2023)   PRAPARE - Transportation    Lack of Transportation (Medical): Yes    Lack of Transportation (Non-Medical): Yes  Physical Activity: Inactive (04/08/2023)   Exercise Vital Sign    Days of Exercise per Week: 0 days    Minutes of Exercise per Session: 0 min  Stress: Stress Concern Present (04/08/2023)    [employer] of Occupational Health - Occupational Stress Questionnaire    Feeling of Stress : Rather much  Social Connections: Socially Isolated (04/08/2023)   Social Connection and Isolation Panel [NHANES]    Frequency of Communication with Friends and Family: More than three times a week    Frequency of Social Gatherings with Friends and Family: More than three times a week    Attends Religious Services: Never    Database administrator or Organizations: No    Attends Banker Meetings: Never    Marital Status: Divorced  Catering manager Violence: Not At Risk (04/08/2023)   Humiliation, Afraid, Rape, and Kick questionnaire    Fear of Current or Ex-Partner: No    Emotionally Abused: No    Physically Abused: No    Sexually Abused: No    FAMILY HISTORY: Family History  Problem Relation Age of Onset   Peptic Ulcer Disease Father    Heart attack Father 35       Died of MI at age 23   Heart attack Brother 49       Died of MI at age 66   Obesity Brother    Pneumonia Mother    Healthy Sister    Lupus Daughter    Obsessive Compulsive Disorder Daughter     ALLERGIES:  is allergic to dexilant [dexlansoprazole], isovue [iopamidol], lorazepam, oxycontin [oxycodone], and tramadol hcl.  MEDICATIONS:  Current Outpatient Medications  Medication Sig Dispense Refill   albuterol (VENTOLIN HFA) 108 (90 Base) MCG/ACT inhaler Inhale 2 puffs into the lungs every 6 hours as needed for shortness of breath 54 g 10   ALPRAZolam (XANAX) 0.5 MG tablet Take  1 tablet (0.5 mg total) by mouth at bedtime. To help with sleep. 30 tablet 0   buPROPion (WELLBUTRIN XL) 150 MG 24 hr tablet Take 1 tablet (150 mg total) by mouth every morning. 90 tablet 3   Continuous Glucose Sensor (FREESTYLE LIBRE 3 PLUS SENSOR) MISC Change sensor every 15 days. 6 each 3   Continuous Glucose Sensor (FREESTYLE LIBRE 3 SENSOR) MISC Place 1 sensor on the skin every 14 days. Use to check glucose 6 times daily as  directed 6 each 3   Fluticasone-Umeclidin-Vilant (TRELEGY ELLIPTA) 100-62.5-25 MCG/ACT AEPB Inhale 1 puff into the lungs daily. 180 each 3   furosemide (LASIX) 40 MG tablet Take 0.5 tablets (20 mg total) by mouth daily. 90 tablet 3   gabapentin (NEURONTIN) 600 MG tablet Take 1 tablet (600 mg total) by mouth at bedtime. 90 tablet 3   HYDROcodone-acetaminophen (NORCO/VICODIN) 5-325 MG tablet Take 2 tablets by mouth 2 (two) times daily. 120 tablet 0   insulin degludec (TRESIBA) 100 UNIT/ML FlexTouch Pen Inject 30 Units into the skin every morning. 3 mL 6   Insulin Pen Needle 32G X 4 MM MISC Use to inject insulin daily 100 each 3   ipratropium (ATROVENT) 0.03 % nasal spray Instill 2 sprays into the nose 3 (three) times daily as directed 30 mL 2   ipratropium-albuterol (DUONEB) 0.5-2.5 (3) MG/3ML SOLN Inhale 3 mLs by nebulization every 6 (six) hours as needed (shortness of breath, wheezing). 90 mL 3   ketoconazole (NIZORAL) 2 % cream Apply 1 application topically at bedtime as needed for irritation. 60 g 5   linaclotide (LINZESS) 72 MCG capsule Take 1 capsule (72 mcg total) by mouth daily before breakfast. 90 capsule 3   metoprolol succinate (TOPROL XL) 25 MG 24 hr tablet Take 1 tablet (25 mg total) by mouth daily. 45 tablet 3   omeprazole (PRILOSEC) 40 MG capsule Take 1 capsule (40 mg total) by mouth 2 (two) times daily. 60 capsule 5   ondansetron (ZOFRAN) 4 MG tablet Take 1 tablet (4 mg total) by mouth daily as needed for nausea or vomiting. 30 tablet 1   OXYGEN Inhale 2 L into the lungs at bedtime.     potassium chloride (KLOR-CON M) 10 MEQ tablet Take 1 tablet (10 mEq total) by mouth daily. 30 tablet 11   predniSONE (DELTASONE) 20 MG tablet Take 2 tablets (40 mg total) by mouth daily. 10 tablet 0   ramelteon (ROZEREM) 8 MG tablet Take 1 tablet (8 mg total) by mouth at bedtime. 30 tablet 3   venlafaxine XR (EFFEXOR-XR) 150 MG 24 hr capsule Take 1 capsule (150 mg total) by mouth daily with breakfast.  90 capsule 3   warfarin (COUMADIN) 2.5 MG tablet Take 1 tablet (2.5 mg total) by mouth daily unless otherwise instructed 90 tablet 3   No current facility-administered medications for this visit.    REVIEW OF SYSTEMS:    10 Point review of Systems was done is negative except as noted above.  PHYSICAL EXAMINATION:  .BP (!) 137/47 (BP Location: Left Arm, Patient Position: Sitting) Comment: Nurse notified  Pulse 73   Temp 98.1 F (36.7 C) (Temporal)   Resp 18   Wt 141 lb 8 oz (64.2 kg)   SpO2 94%   BMI 25.88 kg/m   GENERAL:alert, in no acute distress and comfortable SKIN: no acute rashes, no significant lesions EYES: conjunctiva are pink and non-injected, sclera anicteric OROPHARYNX: MMM, no exudates, no oropharyngeal erythema or ulceration NECK: supple,  no JVD LYMPH:  no palpable lymphadenopathy in the cervical, axillary or inguinal regions LUNGS: clear to auscultation b/l with normal respiratory effort HEART: regular rate & rhythm ABDOMEN:  normoactive bowel sounds , non tender, not distended. Extremity: no pedal edema PSYCH: alert & oriented x 3 with fluent speech NEURO: no focal motor/sensory deficits  LABORATORY DATA:  I have reviewed the data as listed  .    Latest Ref Rng & Units 05/23/2023    7:06 AM 05/22/2023    6:37 AM 05/21/2023    4:52 AM  CBC  WBC 4.0 - 10.5 K/uL 7.1  6.2  9.9   Hemoglobin 12.0 - 15.0 g/dL 8.0  7.9  8.3   Hematocrit 36.0 - 46.0 % 29.5  28.6  30.1   Platelets 150 - 400 K/uL 216  219  245     .    Latest Ref Rng & Units 05/21/2023    4:52 AM 05/20/2023    2:15 AM 05/19/2023    1:17 PM  CMP  Glucose 70 - 99 mg/dL 657  846  962   BUN 8 - 23 mg/dL 22  12  14    Creatinine 0.44 - 1.00 mg/dL 9.52  8.41  3.24   Sodium 135 - 145 mmol/L 138  138  138   Potassium 3.5 - 5.1 mmol/L 3.6  3.9  4.1   Chloride 98 - 111 mmol/L 101  104  103   CO2 22 - 32 mmol/L 28  27  31    Calcium 8.9 - 10.3 mg/dL 9.1  9.0  9.0   Total Protein 6.5 - 8.1 g/dL   6.2    Total Bilirubin 0.0 - 1.2 mg/dL   0.5   Alkaline Phos 38 - 126 U/L   50   AST 15 - 41 U/L   9   ALT 0 - 44 U/L   9      RADIOGRAPHIC STUDIES: I have personally reviewed the radiological images as listed and agreed with the findings in the report. CT Head Wo Contrast Result Date: 05/15/2023 CLINICAL DATA:  Hypertension with bad headache. Minor head trauma also reported. EXAM: CT HEAD WITHOUT CONTRAST TECHNIQUE: Contiguous axial images were obtained from the base of the skull through the vertex without intravenous contrast. RADIATION DOSE REDUCTION: This exam was performed according to the departmental dose-optimization program which includes automated exposure control, adjustment of the mA and/or kV according to patient size and/or use of iterative reconstruction technique. COMPARISON:  Head CT 11/19/2022. FINDINGS: Brain: There is mild-to-moderate cerebral atrophy, mild atrophic ventriculomegaly and small-vessel disease of the cerebral white matter. There is only slight cerebellar atrophy. No cortical based acute infarct, hemorrhage or mass are seen. There is no midline shift. The basal cisterns are clear. There are tiny chronic lacunar infarcts in the bilateral basal ganglia. Scattered dystrophic dural calcifications along the falx. Vascular: Patchy calcific plaque both carotid siphons. No hyperdense central vessels. Skull: Negative for fractures or focal lesions. Sinuses/Orbits: Membrane disease and small retention cyst or polyp left maxillary sinus. Other sinuses and mastoid air cells are clear. There is an s shaped nasal septum. Chronic proptosis. Old lens replacements. No new orbital abnormality. Other: None. IMPRESSION: 1. No acute intracranial CT findings.  Chronic changes. 2. Chronic proptosis. 3. Left maxillary sinus disease. Electronically Signed   By: Denman Fischer M.D.   On: 05/15/2023 06:19   DG Chest 2 View Result Date: 05/15/2023 CLINICAL DATA:  Chest pain EXAM: CHEST - 2 VIEW  COMPARISON:  11/19/2022 FINDINGS: Stable cardiomediastinal silhouette. Aortic atherosclerotic calcification. Sternotomy. Cardiac valve prosthesis. Plate and screw fixation right clavicle. Hyperinflation. Chronic bronchitic changes. No focal consolidation, pleural effusion, or pneumothorax. Remote right rib fractures. IMPRESSION: No acute findings.  Emphysema and chronic bronchitic changes. Electronically Signed   By: Rozell Cornet M.D.   On: 05/15/2023 03:04   VAS US  ABI WITH/WO TBI Result Date: 05/14/2023  LOWER EXTREMITY DOPPLER STUDY Patient Name:  Karin Pinedo  Date of Exam:   05/14/2023 Medical Rec #: [id number]               Accession #:    [id number] Date of Birth: May 23, 1950               Patient Gender: F Patient Age:   75 years Exam Location:  [facility] Procedure:      VAS US  ABI WITH/WO TBI Referring Phys: Genny Kid --------------------------------------------------------------------------------  Indications: Claudication, and peripheral artery disease. High Risk Factors: Hypertension, hyperlipidemia, Diabetes, current smoker.  Performing Technologist: Helon Lobos RVT  Examination Guidelines: A complete evaluation includes at minimum, Doppler waveform signals and systolic blood pressure reading at the level of bilateral brachial, anterior tibial, and posterior tibial arteries, when vessel segments are accessible. Bilateral testing is considered an integral part of a complete examination. Photoelectric Plethysmograph (PPG) waveforms and toe systolic pressure readings are included as required and additional duplex testing as needed. Limited examinations for reoccurring indications may be performed as noted.  ABI Findings: +---------+------------------+-----+----------+--------+ Right    Rt Pressure (mmHg)IndexWaveform  Comment  +---------+------------------+-----+----------+--------+ Brachial 183                                        +---------+------------------+-----+----------+--------+ ATA      91                0.50 monophasic         +---------+------------------+-----+----------+--------+ PTA      99                0.54 monophasic         +---------+------------------+-----+----------+--------+ Great Toe54                0.30                    +---------+------------------+-----+----------+--------+ +---------+------------------+-----+----------+-------+ Left     Lt Pressure (mmHg)IndexWaveform  Comment +---------+------------------+-----+----------+-------+ Brachial 177                                      +---------+------------------+-----+----------+-------+ ATA      171               0.93 monophasic        +---------+------------------+-----+----------+-------+ PTA      182               0.99 biphasic          +---------+------------------+-----+----------+-------+ Great Toe103               0.56                   +---------+------------------+-----+----------+-------+ +-------+-----------+-----------+------------+------------+ ABI/TBIToday's ABIToday's TBIPrevious ABIPrevious TBI +-------+-----------+-----------+------------+------------+ Right  0.54       0.3        0.44  0.32         +-------+-----------+-----------+------------+------------+ Left   0.99       0.56       0.86        0.72         +-------+-----------+-----------+------------+------------+  Previous ABI on 08/05/21.  Summary: Right: Resting right ankle-brachial index indicates moderate right lower extremity arterial disease. The right toe-brachial index is abnormal. Left: Resting left ankle-brachial index is within normal range. The left toe-brachial index is abnormal. *See table(s) above for measurements and observations.  Electronically signed by Gerarda Fraction on 05/14/2023 at 1:17:13 PM.    Final    VAS US CAROTID Result Date: 05/14/2023 Carotid Arterial Duplex Study Patient Name:  [name]  Date of Exam:   05/14/2023 Medical Rec #: [id number]               Accession #:    [id number] Date of Birth: 1950/09/16               Patient Gender: F Patient Age:   49 years Exam Location:  [facility] Procedure:      VAS US CAROTID Referring Phys: Coral Else --------------------------------------------------------------------------------  Performing Technologist: Thereasa Parkin RVT  Examination Guidelines: A complete evaluation includes B-mode imaging, spectral Doppler, color Doppler, and power Doppler as needed of all accessible portions of each vessel. Bilateral testing is considered an integral part of a complete examination. Limited examinations for reoccurring indications may be performed as noted.  Right Carotid Findings: +----------+--------+--------+--------+------------------+--------+           PSV cm/sEDV cm/sStenosisPlaque DescriptionComments +----------+--------+--------+--------+------------------+--------+ CCA Prox  107     3               heterogenous               +----------+--------+--------+--------+------------------+--------+ CCA Mid   123     12                                         +----------+--------+--------+--------+------------------+--------+ CCA Distal183     15                                         +----------+--------+--------+--------+------------------+--------+ ICA Prox  177     12      1-39%                              +----------+--------+--------+--------+------------------+--------+ ICA Mid   155     14                                         +----------+--------+--------+--------+------------------+--------+ ICA Distal120     11                                         +----------+--------+--------+--------+------------------+--------+ ECA       190     0                                          +----------+--------+--------+--------+------------------+--------+  +----------+--------+-------+----------------+-------------------+  PSV cm/sEDV cmsDescribe        Arm Pressure (mmHG) +----------+--------+-------+----------------+-------------------+ Subclavian179     0      Multiphasic, [id number]                 +----------+--------+-------+----------------+-------------------+ +---------+--------+--+--------+--+---------+ VertebralPSV cm/s62EDV cm/s11Antegrade +---------+--------+--+--------+--+---------+  Left Carotid Findings: +----------+--------+--------+--------+------------------+--------+           PSV cm/sEDV cm/sStenosisPlaque DescriptionComments +----------+--------+--------+--------+------------------+--------+ CCA Prox  119     10              heterogenous               +----------+--------+--------+--------+------------------+--------+ CCA Mid   100     13                                         +----------+--------+--------+--------+------------------+--------+ CCA Distal118     15              heterogenous               +----------+--------+--------+--------+------------------+--------+ ICA Prox  271     26      1-39%   heterogenous               +----------+--------+--------+--------+------------------+--------+ ICA Mid   128     13                                         +----------+--------+--------+--------+------------------+--------+ ICA Distal85      17                                         +----------+--------+--------+--------+------------------+--------+ ECA       161     0                                          +----------+--------+--------+--------+------------------+--------+ +----------+--------+--------+----------------+-------------------+           PSV cm/sEDV cm/sDescribe        Arm Pressure (mmHG) +----------+--------+--------+----------------+-------------------+ Subclavian150     0       Multiphasic, [id number]                  +----------+--------+--------+----------------+-------------------+ +---------+--------+---+--------+--+---------+ VertebralPSV cm/s111EDV cm/s14Antegrade +---------+--------+---+--------+--+---------+   Summary: Right Carotid: Patent CEA with no significant stenosis. Left Carotid: Velocities in the left ICA are consistent with a 1-39% stenosis. Vertebrals:  Bilateral vertebral arteries demonstrate antegrade flow. Subclavians: Normal flow hemodynamics were seen in bilateral subclavian              arteries. *See table(s) above for measurements and observations.  Electronically signed by Irvin Mantel on 05/14/2023 at 1:16:57 PM.    Final     ASSESSMENT & PLAN:  73 y.o. female with:  Chronic iron deficiency anemia Likely chronic slow GI losses. PLAN: --Discussed lab results from today, 05/19/2023, in detail with the patient. CBC shows low Hgb of 8.3 g/dL with Hct of 27.2%. CMP stable -Discussed the option of IV Iron infusion since oral irons are not tolerable. Pt agrees. It will be IV Injectafer as it does not cause that many side effects.  -Schedule pt  for IV Iron infusion.  -Answered all of patient's questions.    FOLLOW-UP: IV Injectafer weekly x 2 doses ASAP RTC with Dr Salomon Cree in 2 months with labs  The total time spent in the appointment was 20 minutes* .  All of the patient's questions were answered with apparent satisfaction. The patient knows to call the clinic with any problems, questions or concerns.   Jacquelyn Matt MD MS AAHIVMS [facility] Hematology/Oncology Physician [facility]  .*Total Encounter Time as defined by the Centers for Medicare and Medicaid Services includes, in addition to the face-to-face time of a patient visit (documented in the note above) non-face-to-face time: obtaining and reviewing outside history, ordering and reviewing medications, tests or procedures, care coordination (communications with other health care professionals or caregivers) and  documentation in the medical record.   I,Param Shah,acting as a Neurosurgeon for Jacquelyn Matt, MD.,have documented all relevant documentation on the behalf of Jacquelyn Matt, MD,as directed by  Jacquelyn Matt, MD while in the presence of Jacquelyn Matt, MD.  .I have reviewed the above documentation for accuracy and completeness, and I agree with the above. .Kalilah Barua Kishore Ainsley Sanguinetti MD

## 2023-05-19 NOTE — Telephone Encounter (Signed)
 Chief Complaint: pain  Symptoms: pain and swelling in bilateral ankles Frequency: 2 weeks Pertinent Negatives: Patient denies sob Disposition: [] ED /[] Urgent Care (no appt availability in office) / [] Appointment(In office/virtual)/ []  [facility]/ [x] Home Care/ [] Refused Recommended Disposition /[] [facility]/ [x]  Follow-up with PCP Additional Notes: Pt states that she is experiencing pain and swelling in her ankles. States she started noticing about 2 weeks ago. States that she thinks swelling is to the decrease in her fluid pill. States pain mild and mid swelling that is worse in the evenings. Pt already has appt scheduled for 06/02/23.   Copied from CRM [id number]. Topic: Clinical - Red Word Triage >> May 19, 2023  9:44 AM Irine Seal wrote: [facility] that prompted transfer to Nurse Triage: pain in ankles, fluid retention, then she stated she had brown nasal discharge, that has since turned clear. She wanted to get a sooner appointment, her PCP has none, offered to schedule with a resident patient refused stating she will only see her doctor. She then asked if she could speak directly with the nurse. Reason for Disposition  [1] MILD pain (e.g., does not interfere with normal activities) AND [2] present > 7 days  Answer Assessment - Initial Assessment Questions 1. ONSET: "When did the pain start?"      About 2 weeks 2. LOCATION: "Where is the pain located?"      Both ankles 3. PAIN: "How bad is the pain?"    (Scale 1-10; or mild, moderate, severe)  - MILD (1-3): doesn't interfere with normal activities.   - MODERATE (4-7): interferes with normal activities (e.g., work or school) or awakens from sleep, limping.   - SEVERE (8-10): excruciating pain, unable to do any normal activities, unable to walk.      mild 4. WORK OR EXERCISE: "Has there been any recent work or exercise that involved this part of the body?"      no 5. CAUSE: "What do you think is causing the ankle pain?"      none 6. OTHER SYMPTOMS: "Do you have any other symptoms?" (e.g., calf pain, rash, fever, swelling)     swelling  Protocols used: Ankle Pain-A-AH

## 2023-05-19 NOTE — Telephone Encounter (Signed)
 Return call from pt. Stated she does not want to see another doctor; she will keep her appt on 4/22 with Dr [name]. Stated her ankles are not very swollen, "just hurts". Stated she might take an extra fluid and potassium pills.

## 2023-05-19 NOTE — Progress Notes (Incomplete)
 Received after hours call from [name] who stated she just wanted to talk with someone. When I asked what she needed to talk about she stated she was having some chest tightness and shortness of breath. She reports these symptoms to be going on for over a week and she was evaluated in the ED on 05/15/23 for this. She reports they haven't improved or worsened since then. The pain and dyspnea can be with or without activity and no consistent transformational factors were reported. They are intermittent in nature. Pt does state she lives alone and just talking with me has made her feel significantly better. Ddx is broad and cannot be fully addressed via

## 2023-05-20 ENCOUNTER — Encounter (HOSPITAL_COMMUNITY): Payer: Self-pay

## 2023-05-20 ENCOUNTER — Inpatient Hospital Stay (HOSPITAL_COMMUNITY)
Admission: EM | Admit: 2023-05-20 | Discharge: 2023-05-23 | DRG: 305 | Disposition: A | Discharge status: Home / Self Care | Attending: Internal Medicine | Admitting: Internal Medicine

## 2023-05-20 ENCOUNTER — Other Ambulatory Visit: Payer: Self-pay

## 2023-05-20 ENCOUNTER — Emergency Department (HOSPITAL_COMMUNITY)

## 2023-05-20 ENCOUNTER — Other Ambulatory Visit: Payer: PPO

## 2023-05-20 ENCOUNTER — Telehealth: Payer: Self-pay | Admitting: Hematology

## 2023-05-20 ENCOUNTER — Ambulatory Visit: Payer: PPO | Admitting: Hematology

## 2023-05-20 DIAGNOSIS — F17210 Nicotine dependence, cigarettes, uncomplicated: Secondary | ICD-10-CM | POA: Diagnosis present

## 2023-05-20 DIAGNOSIS — R079 Chest pain, unspecified: Secondary | ICD-10-CM | POA: Diagnosis not present

## 2023-05-20 DIAGNOSIS — Z885 Allergy status to narcotic agent status: Secondary | ICD-10-CM

## 2023-05-20 DIAGNOSIS — F32A Depression, unspecified: Secondary | ICD-10-CM | POA: Diagnosis not present

## 2023-05-20 DIAGNOSIS — I5032 Chronic diastolic (congestive) heart failure: Secondary | ICD-10-CM | POA: Diagnosis present

## 2023-05-20 DIAGNOSIS — F17200 Nicotine dependence, unspecified, uncomplicated: Secondary | ICD-10-CM | POA: Diagnosis present

## 2023-05-20 DIAGNOSIS — Z794 Long term (current) use of insulin: Secondary | ICD-10-CM

## 2023-05-20 DIAGNOSIS — E1140 Type 2 diabetes mellitus with diabetic neuropathy, unspecified: Secondary | ICD-10-CM | POA: Diagnosis present

## 2023-05-20 DIAGNOSIS — Z961 Presence of intraocular lens: Secondary | ICD-10-CM | POA: Diagnosis present

## 2023-05-20 DIAGNOSIS — I517 Cardiomegaly: Secondary | ICD-10-CM | POA: Diagnosis not present

## 2023-05-20 DIAGNOSIS — J441 Chronic obstructive pulmonary disease with (acute) exacerbation: Secondary | ICD-10-CM | POA: Diagnosis not present

## 2023-05-20 DIAGNOSIS — G4734 Idiopathic sleep related nonobstructive alveolar hypoventilation: Secondary | ICD-10-CM | POA: Diagnosis present

## 2023-05-20 DIAGNOSIS — Z609 Problem related to social environment, unspecified: Secondary | ICD-10-CM | POA: Diagnosis present

## 2023-05-20 DIAGNOSIS — Z79899 Other long term (current) drug therapy: Secondary | ICD-10-CM | POA: Diagnosis not present

## 2023-05-20 DIAGNOSIS — Z9841 Cataract extraction status, right eye: Secondary | ICD-10-CM

## 2023-05-20 DIAGNOSIS — R0602 Shortness of breath: Secondary | ICD-10-CM | POA: Diagnosis not present

## 2023-05-20 DIAGNOSIS — I700 Atherosclerosis of aorta: Secondary | ICD-10-CM | POA: Diagnosis present

## 2023-05-20 DIAGNOSIS — I160 Hypertensive urgency: Principal | ICD-10-CM | POA: Diagnosis present

## 2023-05-20 DIAGNOSIS — I480 Paroxysmal atrial fibrillation: Secondary | ICD-10-CM | POA: Diagnosis present

## 2023-05-20 DIAGNOSIS — Z8249 Family history of ischemic heart disease and other diseases of the circulatory system: Secondary | ICD-10-CM | POA: Diagnosis not present

## 2023-05-20 DIAGNOSIS — I2723 Pulmonary hypertension due to lung diseases and hypoxia: Secondary | ICD-10-CM | POA: Diagnosis present

## 2023-05-20 DIAGNOSIS — I509 Heart failure, unspecified: Secondary | ICD-10-CM | POA: Diagnosis not present

## 2023-05-20 DIAGNOSIS — Z888 Allergy status to other drugs, medicaments and biological substances status: Secondary | ICD-10-CM

## 2023-05-20 DIAGNOSIS — G8929 Other chronic pain: Secondary | ICD-10-CM | POA: Diagnosis present

## 2023-05-20 DIAGNOSIS — I161 Hypertensive emergency: Secondary | ICD-10-CM | POA: Diagnosis not present

## 2023-05-20 DIAGNOSIS — Z733 Stress, not elsewhere classified: Secondary | ICD-10-CM | POA: Diagnosis not present

## 2023-05-20 DIAGNOSIS — E1165 Type 2 diabetes mellitus with hyperglycemia: Secondary | ICD-10-CM | POA: Diagnosis present

## 2023-05-20 DIAGNOSIS — Z7901 Long term (current) use of anticoagulants: Secondary | ICD-10-CM | POA: Diagnosis not present

## 2023-05-20 DIAGNOSIS — R0902 Hypoxemia: Secondary | ICD-10-CM | POA: Diagnosis not present

## 2023-05-20 DIAGNOSIS — E118 Type 2 diabetes mellitus with unspecified complications: Secondary | ICD-10-CM

## 2023-05-20 DIAGNOSIS — J439 Emphysema, unspecified: Secondary | ICD-10-CM | POA: Diagnosis not present

## 2023-05-20 DIAGNOSIS — I5033 Acute on chronic diastolic (congestive) heart failure: Secondary | ICD-10-CM | POA: Diagnosis present

## 2023-05-20 DIAGNOSIS — Z8673 Personal history of transient ischemic attack (TIA), and cerebral infarction without residual deficits: Secondary | ICD-10-CM | POA: Diagnosis not present

## 2023-05-20 DIAGNOSIS — R739 Hyperglycemia, unspecified: Secondary | ICD-10-CM | POA: Diagnosis not present

## 2023-05-20 DIAGNOSIS — Z953 Presence of xenogenic heart valve: Secondary | ICD-10-CM

## 2023-05-20 DIAGNOSIS — Z9842 Cataract extraction status, left eye: Secondary | ICD-10-CM

## 2023-05-20 DIAGNOSIS — I110 Hypertensive heart disease with heart failure: Secondary | ICD-10-CM | POA: Diagnosis not present

## 2023-05-20 DIAGNOSIS — S2241XA Multiple fractures of ribs, right side, initial encounter for closed fracture: Secondary | ICD-10-CM | POA: Diagnosis not present

## 2023-05-20 DIAGNOSIS — E11649 Type 2 diabetes mellitus with hypoglycemia without coma: Secondary | ICD-10-CM | POA: Diagnosis present

## 2023-05-20 DIAGNOSIS — I10 Essential (primary) hypertension: Secondary | ICD-10-CM | POA: Diagnosis not present

## 2023-05-20 HISTORY — DX: Hypertensive urgency: I16.0

## 2023-05-20 LAB — CBC WITH DIFFERENTIAL/PLATELET
Abs Immature Granulocytes: 0.03 10*3/uL (ref 0.00–0.07)
Basophils Absolute: 0.1 10*3/uL (ref 0.0–0.1)
Basophils Relative: 1 %
Eosinophils Absolute: 0.1 10*3/uL (ref 0.0–0.5)
Eosinophils Relative: 1 %
HCT: 31.1 % — ABNORMAL LOW (ref 36.0–46.0)
Hemoglobin: 8.5 g/dL — ABNORMAL LOW (ref 12.0–15.0)
Immature Granulocytes: 0 %
Lymphocytes Relative: 12 %
Lymphs Abs: 1.2 10*3/uL (ref 0.7–4.0)
MCH: 20.8 pg — ABNORMAL LOW (ref 26.0–34.0)
MCHC: 27.3 g/dL — ABNORMAL LOW (ref 30.0–36.0)
MCV: 76.2 fL — ABNORMAL LOW (ref 80.0–100.0)
Monocytes Absolute: 0.9 10*3/uL (ref 0.1–1.0)
Monocytes Relative: 9 %
Neutro Abs: 7.7 10*3/uL (ref 1.7–7.7)
Neutrophils Relative %: 77 %
Platelets: 242 10*3/uL (ref 150–400)
RBC: 4.08 MIL/uL (ref 3.87–5.11)
RDW: 18.8 % — ABNORMAL HIGH (ref 11.5–15.5)
WBC: 10 10*3/uL (ref 4.0–10.5)
nRBC: 0 % (ref 0.0–0.2)

## 2023-05-20 LAB — BRAIN NATRIURETIC PEPTIDE: B Natriuretic Peptide: 371.5 pg/mL — ABNORMAL HIGH (ref 0.0–100.0)

## 2023-05-20 LAB — BASIC METABOLIC PANEL WITH GFR
Anion gap: 7 (ref 5–15)
BUN: 12 mg/dL (ref 8–23)
CO2: 27 mmol/L (ref 22–32)
Calcium: 9 mg/dL (ref 8.9–10.3)
Chloride: 104 mmol/L (ref 98–111)
Creatinine, Ser: 0.66 mg/dL (ref 0.44–1.00)
GFR, Estimated: >60 mL/min (ref >60)
Glucose, Bld: 218 mg/dL — ABNORMAL HIGH (ref 70–99)
Potassium: 3.9 mmol/L (ref 3.5–5.1)
Sodium: 138 mmol/L (ref 135–145)

## 2023-05-20 LAB — TROPONIN I (HIGH SENSITIVITY)
Troponin I (High Sensitivity): 15 ng/L (ref <18)
Troponin I (High Sensitivity): 16 ng/L (ref <18)

## 2023-05-20 LAB — GLUCOSE, CAPILLARY
Glucose-Capillary: 444 mg/dL — ABNORMAL HIGH (ref 70–99)
Glucose-Capillary: 388 mg/dL — ABNORMAL HIGH (ref 70–99)
Glucose-Capillary: 270 mg/dL — ABNORMAL HIGH (ref 70–99)
Glucose-Capillary: 129 mg/dL — ABNORMAL HIGH (ref 70–99)
Glucose-Capillary: 62 mg/dL — ABNORMAL LOW (ref 70–99)

## 2023-05-20 MED ORDER — TRIMETHOBENZAMIDE HCL 100 MG/ML IM SOLN
200.0000 mg | Freq: Four times a day (QID) | INTRAMUSCULAR | Status: DC | PRN
  Administered 2023-05-20 – 2023-05-23 (×2): 200 mg via INTRAMUSCULAR
  Filled 2023-05-20 (×3): qty 2

## 2023-05-20 MED ORDER — KETOROLAC TROMETHAMINE 15 MG/ML IJ SOLN
15.0000 mg | Freq: Once | INTRAMUSCULAR | Status: AC
  Administered 2023-05-20: 15 mg via INTRAVENOUS
  Filled 2023-05-20: qty 1

## 2023-05-20 MED ORDER — VENLAFAXINE HCL ER 75 MG PO CP24
150.0000 mg | ORAL_CAPSULE | Freq: Every day | ORAL | Status: DC
  Administered 2023-05-21 – 2023-05-23 (×3): 150 mg via ORAL
  Filled 2023-05-20 (×3): qty 2

## 2023-05-20 MED ORDER — ACETAMINOPHEN 325 MG PO TABS: 650.0000 mg | ORAL_TABLET | Freq: Four times a day (QID) | ORAL | Status: DC | PRN

## 2023-05-20 MED ORDER — AMLODIPINE BESYLATE 5 MG PO TABS
5.0000 mg | ORAL_TABLET | Freq: Every day | ORAL | Status: DC
  Administered 2023-05-20 – 2023-05-21 (×2): 5 mg via ORAL
  Filled 2023-05-20 (×2): qty 1

## 2023-05-20 MED ORDER — WARFARIN - PHARMACIST DOSING INPATIENT
Freq: Every day | Status: DC
Start: 2023-05-20 — End: 2023-05-23

## 2023-05-20 MED ORDER — ACETAMINOPHEN 500 MG PO TABS: 1000.0000 mg | ORAL_TABLET | Freq: Four times a day (QID) | ORAL | Status: DC | PRN

## 2023-05-20 MED ORDER — NITROGLYCERIN IN D5W 200-5 MCG/ML-% IV SOLN
0.0000 ug/min | INTRAVENOUS | Status: DC
  Administered 2023-05-20: 5 ug/min via INTRAVENOUS
  Filled 2023-05-20 (×2): qty 250

## 2023-05-20 MED ORDER — WARFARIN SODIUM 2.5 MG PO TABS
2.5000 mg | ORAL_TABLET | Freq: Every day | ORAL | Status: DC
Start: 2023-05-20 — End: 2023-05-20

## 2023-05-20 MED ORDER — RAMELTEON 8 MG PO TABS
8.0000 mg | ORAL_TABLET | Freq: Every day | ORAL | Status: DC
  Administered 2023-05-20 – 2023-05-22 (×3): 8 mg via ORAL
  Filled 2023-05-20 (×4): qty 1

## 2023-05-20 MED ORDER — METHYLPREDNISOLONE SODIUM SUCC 125 MG IJ SOLR
125.0000 mg | INTRAMUSCULAR | Status: AC
  Administered 2023-05-20: 125 mg via INTRAVENOUS
  Filled 2023-05-20: qty 2

## 2023-05-20 MED ORDER — INSULIN GLARGINE-YFGN 100 UNIT/ML ~~LOC~~ SOLN
30.0000 [IU] | Freq: Every morning | SUBCUTANEOUS | Status: DC
  Administered 2023-05-20 – 2023-05-23 (×4): 30 [IU] via SUBCUTANEOUS
  Filled 2023-05-20 (×4): qty 0.3

## 2023-05-20 MED ORDER — ALBUTEROL SULFATE (2.5 MG/3ML) 0.083% IN NEBU: 2.5000 mg | INHALATION_SOLUTION | RESPIRATORY_TRACT | Status: DC | PRN

## 2023-05-20 MED ORDER — ALPRAZOLAM 0.5 MG PO TABS
0.5000 mg | ORAL_TABLET | Freq: Every day | ORAL | Status: DC
  Administered 2023-05-20 – 2023-05-22 (×3): 0.5 mg via ORAL
  Filled 2023-05-20 (×3): qty 1

## 2023-05-20 MED ORDER — IPRATROPIUM-ALBUTEROL 0.5-2.5 (3) MG/3ML IN SOLN
3.0000 mL | Freq: Four times a day (QID) | RESPIRATORY_TRACT | Status: DC
  Administered 2023-05-20 (×2): 3 mL via RESPIRATORY_TRACT
  Filled 2023-05-20 (×2): qty 3

## 2023-05-20 MED ORDER — INSULIN ASPART 100 UNIT/ML IJ SOLN
0.0000 [IU] | Freq: Three times a day (TID) | INTRAMUSCULAR | Status: DC
  Administered 2023-05-20: 15 [IU] via SUBCUTANEOUS
  Administered 2023-05-20: 8 [IU] via SUBCUTANEOUS
  Administered 2023-05-21: 3 [IU] via SUBCUTANEOUS

## 2023-05-20 MED ORDER — FUROSEMIDE 10 MG/ML IJ SOLN
40.0000 mg | Freq: Once | INTRAMUSCULAR | Status: AC
  Administered 2023-05-20: 40 mg via INTRAVENOUS
  Filled 2023-05-20: qty 4

## 2023-05-20 MED ORDER — GABAPENTIN 300 MG PO CAPS
600.0000 mg | ORAL_CAPSULE | Freq: Every day | ORAL | Status: DC
  Administered 2023-05-20 – 2023-05-22 (×3): 600 mg via ORAL
  Filled 2023-05-20 (×3): qty 2

## 2023-05-20 MED ORDER — GABAPENTIN 600 MG PO TABS: 600.0000 mg | ORAL_TABLET | Freq: Every day | ORAL | Status: DC

## 2023-05-20 MED ORDER — INSULIN GLARGINE 100 UNITS/ML SOLOSTAR PEN
30.0000 [IU] | PEN_INJECTOR | Freq: Every morning | SUBCUTANEOUS | Status: DC
Start: 2023-05-20 — End: 2023-05-20
  Filled 2023-05-20: qty 3

## 2023-05-20 MED ORDER — WARFARIN SODIUM 2.5 MG PO TABS
2.5000 mg | ORAL_TABLET | Freq: Once | ORAL | Status: AC
  Administered 2023-05-20: 2.5 mg via ORAL
  Filled 2023-05-20: qty 1

## 2023-05-20 MED ORDER — IPRATROPIUM-ALBUTEROL 0.5-2.5 (3) MG/3ML IN SOLN
3.0000 mL | Freq: Two times a day (BID) | RESPIRATORY_TRACT | Status: DC
  Administered 2023-05-21 – 2023-05-22 (×3): 3 mL via RESPIRATORY_TRACT
  Filled 2023-05-20 (×5): qty 3

## 2023-05-20 MED ORDER — NICOTINE 14 MG/24HR TD PT24
14.0000 mg | MEDICATED_PATCH | Freq: Every day | TRANSDERMAL | Status: DC
  Administered 2023-05-20 – 2023-05-23 (×4): 14 mg via TRANSDERMAL
  Filled 2023-05-20 (×4): qty 1

## 2023-05-20 MED ORDER — IPRATROPIUM-ALBUTEROL 0.5-2.5 (3) MG/3ML IN SOLN
3.0000 mL | Freq: Once | RESPIRATORY_TRACT | Status: AC
  Administered 2023-05-20: 3 mL via RESPIRATORY_TRACT
  Filled 2023-05-20: qty 3

## 2023-05-20 MED ORDER — FUROSEMIDE 20 MG PO TABS
20.0000 mg | ORAL_TABLET | Freq: Every day | ORAL | Status: DC
  Administered 2023-05-20 – 2023-05-23 (×4): 20 mg via ORAL
  Filled 2023-05-20 (×4): qty 1

## 2023-05-20 MED ORDER — HYDRALAZINE HCL 20 MG/ML IJ SOLN
5.0000 mg | Freq: Once | INTRAMUSCULAR | Status: AC
  Administered 2023-05-20: 5 mg via INTRAVENOUS
  Filled 2023-05-20: qty 1

## 2023-05-20 MED ORDER — LIDOCAINE 5 % EX PTCH
1.0000 | MEDICATED_PATCH | CUTANEOUS | Status: DC
  Administered 2023-05-20 – 2023-05-22 (×3): 1 via TRANSDERMAL
  Filled 2023-05-20 (×3): qty 1

## 2023-05-20 MED ORDER — NITROGLYCERIN 0.4 MG SL SUBL
0.4000 mg | SUBLINGUAL_TABLET | SUBLINGUAL | Status: AC
  Administered 2023-05-20: 0.4 mg via SUBLINGUAL
  Filled 2023-05-20: qty 1

## 2023-05-20 MED ORDER — ACETAMINOPHEN 650 MG RE SUPP: 650.0000 mg | Freq: Four times a day (QID) | RECTAL | Status: DC | PRN

## 2023-05-20 MED ORDER — HYDROCODONE-ACETAMINOPHEN 5-325 MG PO TABS
2.0000 | ORAL_TABLET | Freq: Two times a day (BID) | ORAL | Status: DC
  Administered 2023-05-20 – 2023-05-23 (×7): 2 via ORAL
  Filled 2023-05-20 (×7): qty 2

## 2023-05-20 MED ORDER — BUPROPION HCL ER (XL) 150 MG PO TB24
150.0000 mg | ORAL_TABLET | ORAL | Status: DC
  Administered 2023-05-20 – 2023-05-23 (×4): 150 mg via ORAL
  Filled 2023-05-20 (×4): qty 1

## 2023-05-20 NOTE — Progress Notes (Signed)
   Carryover admission to the Day Admitter.  I discussed this case with the EDP, Jannifer Hick, PA. Marland Kitchen  Per these discussions:   This is a 73 year old female with history of COPD, chronic diastolic heart failure with most recent echocardiogram in 2024, chronic smoker, paroxysmal atrial fibrillation on chronic anticoagulation, who presents with 2 days of progressive shortness of breath associated with wheezing.  Received duo nebulizer, Solu-Medrol with EMS, which improved her wheezing.   In the ED, oxygen saturations in the high 90s on room air but blood pressure elevated in the low 200s.  Started on nitroglycerin drip for blood pressure control in the absence of any interval chest pain.  BNP elevated at 341.  Has received Lasix, nebulizer treatment, Solu-Medrol, IV hydralazine.  I have placed an order for admission for further evaluation management of the above.  I have placed some additional preliminary admit orders via the adult multi-morbid admission order set.  I will defer additional orders to the admitting physician.    Newton Pigg, DO Hospitalist

## 2023-05-20 NOTE — ED Provider Notes (Signed)
 [place] EMERGENCY DEPARTMENT AT [facility] Provider Note   CSN: [id number] Arrival date & time: 05/20/23  0126     History  Chief Complaint  Patient presents with   Shortness of Breath    [name] is a 73 y.o. female with medical history of type 2 diabetes, paroxysmal atrial fibrillation, tobacco use disorder, chronic low back pain, pulmonary hypertension due to COPD, COPD on home oxygen as needed at night 2 and half liters, still smokes.  Patient presents to ED for evaluation of chest pain and shortness breath.  Reports that all of her symptoms began today.  States that she was seen for shortness of breath 9/4 where she was ultimately discharged after DuoNebs and Solu-Medrol.  Patient reports that today she became short of breath and developed a right sided/sternal chest pain that does not radiate.  She reports her chest pain and shortness of breath are worse with exertion.  She states that she is "lightheaded all the time" when asked if she becomes lightheaded or dizzy on standing.  She denies any new features to her lightheadedness or dizziness today.  She reports that she wears 2-1/2 L of oxygen via nasal cannula, has not increased this recently, only wears this at night.  She is endorsing leg swelling.  She denies abdominal pain, nausea, vomiting, dysuria, fevers.  She denies any new cough.  She reports that the last cigarette she smoked was prior to calling EMS.  Of note, patient was seen by hematology/oncology today with reassuring workup.   Shortness of Breath Associated symptoms: chest pain        Home Medications Prior to Admission medications   Medication Sig Start Date End Date Taking? Authorizing Provider  albuterol (VENTOLIN HFA) 108 (90 Base) MCG/ACT inhaler Inhale 2 puffs into the lungs every 6 hours as needed for shortness of breath 01/21/23   Miguel Aschoff, MD  ALPRAZolam Prudy Feeler) 0.5 MG tablet Take 1 tablet (0.5 mg total) by mouth at  bedtime. To help with sleep. 05/04/23   Miguel Aschoff, MD  buPROPion (WELLBUTRIN XL) 150 MG 24 hr tablet Take 1 tablet (150 mg total) by mouth every morning. 12/24/22   Miguel Aschoff, MD  Continuous Glucose Sensor (FREESTYLE LIBRE 3 PLUS SENSOR) MISC Change sensor every 15 days. 04/29/23   Miguel Aschoff, MD  Continuous Glucose Sensor (FREESTYLE LIBRE 3 SENSOR) MISC Place 1 sensor on the skin every 14 days. Use to check glucose 6 times daily as directed 01/02/23   Miguel Aschoff, MD  Fluticasone-Umeclidin-Vilant (TRELEGY ELLIPTA) 100-62.5-25 MCG/ACT AEPB Inhale 1 puff into the lungs daily. 03/22/23   Oretha Milch, MD  furosemide (LASIX) 40 MG tablet Take 0.5 tablets (20 mg total) by mouth daily. 04/07/23   Miguel Aschoff, MD  gabapentin (NEURONTIN) 600 MG tablet Take 1 tablet (600 mg total) by mouth at bedtime. 01/06/23   Miguel Aschoff, MD  HYDROcodone-acetaminophen (NORCO/VICODIN) 5-325 MG tablet Take 2 tablets by mouth 2 (two) times daily. 05/04/23   Miguel Aschoff, MD  insulin degludec (TRESIBA) 100 UNIT/ML FlexTouch Pen Inject 30 Units into the skin every morning. 04/30/23   Miguel Aschoff, MD  Insulin Pen Needle 32G X 4 MM MISC Use to inject insulin daily 05/18/23   Plyler, Cecil Cranker, RD  ipratropium (ATROVENT) 0.03 % nasal spray Instill 2 sprays into the nose 3 (three) times daily as directed 02/18/23   Miguel Aschoff, MD  ipratropium-albuterol (DUONEB) 0.5-2.5 (  3) MG/3ML SOLN Inhale 3 mLs by nebulization every 6 (six) hours as needed (shortness of breath, wheezing). 04/03/22   Miguel Aschoff, MD  ketoconazole (NIZORAL) 2 % cream Apply 1 application topically at bedtime as needed for irritation. 09/08/22   [name], RPH-CPP  linaclotide Karlene Einstein) 72 MCG capsule Take 1 capsule (72 mcg total) by mouth daily before breakfast. 12/15/22 12/10/23  Miguel Aschoff, MD  metoprolol succinate (TOPROL XL) 25 MG 24 hr tablet Take 1 tablet (25 mg  total) by mouth daily. 04/07/23   Miguel Aschoff, MD  omeprazole (PRILOSEC) 40 MG capsule Take 1 capsule (40 mg total) by mouth 2 (two) times daily. 01/29/23   Miguel Aschoff, MD  ondansetron (ZOFRAN) 4 MG tablet Take 1 tablet (4 mg total) by mouth daily as needed for nausea or vomiting. 11/19/22 11/19/23  Kathleen Lime, MD  OXYGEN Inhale 2 L into the lungs at bedtime.    [provider]  potassium chloride (KLOR-CON M) 10 MEQ tablet Take 1 tablet (10 mEq total) by mouth daily. 03/12/23   Miguel Aschoff, MD  predniSONE (DELTASONE) 20 MG tablet Take 2 tablets (40 mg total) by mouth daily. 05/15/23   Horton, Mayer Masker, MD  ramelteon (ROZEREM) 8 MG tablet Take 1 tablet (8 mg total) by mouth at bedtime. 01/29/23 01/29/24  Miguel Aschoff, MD  venlafaxine XR (EFFEXOR-XR) 150 MG 24 hr capsule Take 1 capsule (150 mg total) by mouth daily with breakfast. 04/07/23   Miguel Aschoff, MD  warfarin (COUMADIN) 2.5 MG tablet Take 1 tablet (2.5 mg total) by mouth daily unless otherwise instructed 12/23/22   Miguel Aschoff, MD      Allergies    Dexilant [dexlansoprazole], Isovue [iopamidol], Lorazepam, Oxycontin [oxycodone], and Tramadol hcl    Review of Systems   Review of Systems  Respiratory:  Positive for shortness of breath.   Cardiovascular:  Positive for chest pain.    Physical Exam Updated Vital Signs BP (!) 200/127   Pulse 90   Temp 98.3 F (36.8 C) (Oral)   Resp (!) 24   Ht 5\' 2"  (1.575 m)   Wt 64 kg   SpO2 99%   BMI 25.79 kg/m  Physical Exam Vitals and nursing note reviewed.  Constitutional:      General: She is not in acute distress.    Appearance: She is well-developed.  HENT:     Head: Normocephalic and atraumatic.  Eyes:     Conjunctiva/sclera: Conjunctivae normal.  Cardiovascular:     Rate and Rhythm: Normal rate and regular rhythm.     Heart sounds: No murmur heard. Pulmonary:     Effort: Pulmonary effort is normal. No respiratory  distress.     Breath sounds: Wheezing present.  Abdominal:     Palpations: Abdomen is soft.     Tenderness: There is no abdominal tenderness.  Musculoskeletal:        General: No swelling.     Cervical back: Neck supple.     Right lower leg: Edema present.     Left lower leg: Edema present.     Comments: 1+ pitting edema to bilateral lower extremities  Skin:    General: Skin is warm and dry.     Capillary Refill: Capillary refill takes less than 2 seconds.  Neurological:     Mental Status: She is alert and oriented to person, place, and time.  Psychiatric:        Mood and Affect:  Mood normal.     ED Results / Procedures / Treatments   Labs (all labs ordered are listed, but only abnormal results are displayed) Labs Reviewed  BASIC METABOLIC PANEL WITH GFR - Abnormal; Notable for the following components:      Result Value   Glucose, Bld 218 (*)    All other components within normal limits  CBC WITH DIFFERENTIAL/PLATELET - Abnormal; Notable for the following components:   Hemoglobin 8.5 (*)    HCT 31.1 (*)    MCV 76.2 (*)    MCH 20.8 (*)    MCHC 27.3 (*)    RDW 18.8 (*)    All other components within normal limits  BRAIN NATRIURETIC PEPTIDE - Abnormal; Notable for the following components:   B Natriuretic Peptide 371.5 (*)    All other components within normal limits  TROPONIN I (HIGH SENSITIVITY)  TROPONIN I (HIGH SENSITIVITY)    EKG None  Radiology DG Chest Port 1 View Result Date: 05/20/2023 CLINICAL DATA:  Shortness of breath EXAM: PORTABLE CHEST 1 VIEW COMPARISON:  Five days prior FINDINGS: Cardiomegaly. Prior median sternotomy for valve repair. Chronic interstitial coarsening without Kerley lines, likely bronchitic. Blunting of the lateral left costophrenic sulcus is stable. No consolidation or pneumothorax. Remote right rib fractures and right clavicular plating. IMPRESSION: No acute finding or change from prior. Cardiomegaly and emphysema. Electronically Signed    By: Tiburcio Pea M.D.   On: 05/20/2023 05:41    Procedures Procedures   Medications Ordered in ED Medications  nitroGLYCERIN 50 mg in dextrose 5 % 250 mL (0.2 mg/mL) infusion (35 mcg/min Intravenous Rate/Dose Change 05/20/23 0627)  hydrALAZINE (APRESOLINE) injection 5 mg (has no administration in time range)  acetaminophen (TYLENOL) tablet 650 mg (has no administration in time range)    Or  acetaminophen (TYLENOL) suppository 650 mg (has no administration in time range)  ipratropium-albuterol (DUONEB) 0.5-2.5 (3) MG/3ML nebulizer solution 3 mL (3 mLs Nebulization Given 05/20/23 0228)  methylPREDNISolone sodium succinate (SOLU-MEDROL) 125 mg/2 mL injection 125 mg (125 mg Intravenous Given 05/20/23 0228)  nitroGLYCERIN (NITROSTAT) SL tablet 0.4 mg (0.4 mg Sublingual Given 05/20/23 0329)  furosemide (LASIX) injection 40 mg (40 mg Intravenous Given 05/20/23 0622)    ED Course/ Medical Decision Making/ A&P Clinical Course as of 05/20/23 8295  Wed May 20, 2023  0313 Reassessed after duoneb, states still SOB however wheezing has decreased. Hypertensive, will provide nitrogycerin and reassess  [CG]  0504 HTN urgency, CHF exacerbation [CG]    Clinical Course User Index [CG] Al Decant, PA-C   Medical Decision Making Amount and/or Complexity of Data Reviewed Labs: ordered. Radiology: ordered.  Risk Prescription drug management. Decision regarding hospitalization.   73 year old female presents for evaluation.  Please see HPI for further details.  On examination patient is afebrile, nontachycardic.  Her lung sounds have wheezing bilaterally, oxygen saturation 99% on 2 L of oxygen.  Abdomen soft and compressible.  Neurological examinations baseline.  1+ pitting edema to bilateral lower extremities.  Patient given 125 Solu-Medrol, DuoNeb at this time.  Differential diagnosis includes COPD exacerbation versus CHF versus ACS.  Patient CBC without leukocytosis, baseline hemoglobin.   Patient metabolic panel with glucose 218, no electrolyte derangement, anion gap 7.  Troponins 15, delta 16.  BNP elevated 371.5.  Recent echocardiogram shows patient ejection fraction is 65 to 70%.  Will provide patient with 40 mg of Lasix.  After initial DuoNeb and Solu-Medrol, patient wheezing has subsided.  Patient hypertensive in the 200 systolic.  Patient given 0.4 mg nitroglycerin sublingual.  Patient blood pressure did improve to 130 systolic but only maintained for about 1 hour.  Patient blood pressure then elevated back to 200 systolic.  Patient was started on nitro drip at this time.  Will admit patient to hospital for hypertensive urgency, CHF exacerbation.  Patient admitted to Dr. Dimas Aguas at this time.  Final Clinical Impression(s) / ED Diagnoses Final diagnoses:  Shortness of breath  Acute on chronic congestive heart failure, unspecified heart failure type [facility])  Hypertensive urgency    Rx / DC Orders ED Discharge Orders     None         Clent Ridges 05/20/23 1610    Marily Memos, MD 05/20/23 [phone]

## 2023-05-20 NOTE — TOC Initial Note (Addendum)
 Transition of Care [facility]) - Initial/Assessment Note    Patient Details  Name: [name] MRN: [id number] Date of Birth: [birth year]  Transition of Care [facility]) CM/SW Contact:    Marliss Coots, LCSW Phone Number: 05/20/2023, 4:26 PM  Clinical Narrative:                  4:26 PM CSW and MSW intern introduced selves and roles to patient at bedside. Patient consented CSW to speak in front of MSW intern. Patient confirmed she resides in a one-story home alone. Patient stated older sister resides in the area, as well, and sometimes provides transportation to and from medical/non-medical appointments. CSW followed up on SDOH needs (food, social connections) and offered resources. Patient denied experiencing food insecurity and stated that she was ineligible for food stamps when applied six months ago, but consented to receiving social connections resources. CSW offered transportation resources in efforts to assist in attending medical/non-medical appointments. Patient accepted CSW offer. CSW provided printed transportation and social connections resources. CSW added transportation and social connections resources to AVS, as well. Patient expressed familiarity with the PACE program. CSW informed patient of transportation options for discharge (taxi voucher/bus passes) and of how to obtain them (ask bedside RN) at discharge. Patient expressed understanding of the information.   Expected Discharge Plan: Home/Self Care Barriers to Discharge: Continued Medical Work up   Patient Goals and CMS Choice Patient states their goals for this hospitalization and ongoing recovery are:: to return home          Expected Discharge Plan and Services       Living arrangements for the past 2 months: Single Family Home                                      Prior Living Arrangements/Services Living arrangements for the past 2 months: Single Family Home Lives with:: Self Patient language and  need for interpreter reviewed:: Yes Do you feel safe going back to the place where you live?: Yes            Criminal Activity/Legal Involvement Pertinent to Current Situation/Hospitalization: No - Comment as needed  Activities of Daily Living   ADL Screening (condition at time of admission) Independently performs ADLs?: No Does the patient have a NEW difficulty with bathing/dressing/toileting/self-feeding that is expected to last >3 days?: No Does the patient have a NEW difficulty with getting in/out of bed, walking, or climbing stairs that is expected to last >3 days?: No Does the patient have a NEW difficulty with communication that is expected to last >3 days?: No Is the patient deaf or have difficulty hearing?: No Does the patient have difficulty seeing, even when wearing glasses/contacts?: No Does the patient have difficulty concentrating, remembering, or making decisions?: No  Permission Sought/Granted Permission sought to share information with : Family Supports Permission granted to share information with : No (Contact information on chart)  Share Information with NAME: Cynda Acres     Permission granted to share info w Relationship: Sister  Permission granted to share info w Contact Information: [phone]  Emotional Assessment Appearance:: Appears stated age   Affect (typically observed): Accepting, Appropriate, Adaptable, Calm, Stable, Pleasant Orientation: : Oriented to Self, Oriented to Place, Oriented to  Time, Oriented to Situation Alcohol / Substance Use: Not Applicable Psych Involvement: No (comment)  Admission diagnosis:  Shortness of breath [R06.02] Acute exacerbation of chronic  obstructive pulmonary disease (COPD) (HCC) [J44.1] Hypertensive urgency [I16.0] Acute on chronic congestive heart failure, unspecified heart failure type [facility]) [I50.9] Patient Active Problem List   Diagnosis Date Noted   Acute exacerbation of chronic obstructive pulmonary  disease (COPD) (HCC) 05/20/2023   Hypertensive urgency 05/20/2023   Iron deficiency anemia 05/19/2023   IDA (iron deficiency anemia) 05/19/2023   Atrial fibrillation (HCC) 12/23/2022   History of atrial fibrillation 10/18/2022   Chronic anticoagulation 10/18/2022   History of fracture of left hip 10/17/2022   Generalized pain 08/01/2022   Multifactorial functional impairment 02/13/2022   History of stroke without residual deficits 01/17/2022   Diabetic peripheral neuropathy associated with type 2 diabetes mellitus (HCC) 11/11/2021   Recurrent falls 11/11/2021   Gait instability 09/19/2021   Perennial non-allergic rhinitis 09/19/2021   Chronic, continuous use of opioids for chronic pain 07/10/2021   Polypharmacy 11/08/2020   Personal history of renal cell carcinoma 09/12/2020   Tinea pedis of both feet 08/07/2020   Essential tremor 11/17/2019   Nocturnal hypoxia per sleep study 07/2019    Carotid artery stenosis; s/p R endarterectomy    COPD mixed type (HCC)    Arthritis of both hands 07/01/2019   Stage 2 chronic kidney disease due to type 2 diabetes mellitus (HCC) 12/16/2018   Lichen sclerosus of female genitalia 01/12/2017   Mixed stress and urge urinary incontinence 05/27/2016   Pulmonary hypertension due to chronic obstructive pulmonary disease (HCC) 04/25/2016   Asymptomatic cholelithiasis 09/25/2015   Chronic iron deficiency anemia 12/26/2014   Aortic atherosclerosis (HCC) 10/19/2014   Right nephrolithiasis, asymptomatic (incidental finding) 09/06/2014   Headache, chronic intermittent 01/14/2013   Chronic low back pain 10/06/2012   Long term current use of anticoagulant therapy 09/30/2012   Recurrent candidal intertrigo 07/28/2012   Tobacco use disorder 07/28/2012   History of colonic polyps 05/14/2011   Sleep disturbance 05/14/2011   Abdominal wall hernia 05/14/2011   Constipation due to opioid therapy 02/03/2011   Health care maintenance 11/04/2010   PAF (paroxysmal  atrial fibrillation) (HCC) 10/22/2010   Chronic diastolic heart failure (HCC) 10/21/2010   Fibromyalgia 08/29/2010   Gastroesophageal reflux disease    Type 2 diabetes mellitus with complication, with long-term current use of insulin (HCC)    Osteoporosis with current pathological fracture with routine healing    Peripheral arterial disease (HCC) 05/27/2010   History of mitral valve replacement with bioprosthetic valve 2012   Essential hypertension 06/19/2008   Hyperlipidemia 11/20/2005   Severe major depression (HCC) 11/19/2005   PCP:  Miguel Aschoff, MD Pharmacy:   [facility] [address] [address] Phone: [phone] Fax: [phone]  [id number] [place], [place] - [address] AT [facility] [address] [facility] [address] [place] [address] Phone: [phone] Fax: [phone]  [facility] [address] [place] [address] Phone: [phone] Fax: [phone]     Social Drivers of Health (SDOH) Social History: SDOH Screenings   Food Insecurity: Food Insecurity Present (05/20/2023)  Housing: Low Risk  (05/20/2023)  Transportation Needs: No Transportation Needs (05/20/2023)  Recent Concern: Transportation Needs - Unmet Transportation Needs (04/08/2023)  Utilities: Not At Risk (05/20/2023)  Alcohol Screen: Low Risk  (04/08/2023)  Depression (PHQ2-9): High Risk (04/08/2023)  Financial Resource Strain: Low Risk  (04/08/2023)  Physical Activity: Inactive (04/08/2023)  Social Connections: Socially Isolated (05/20/2023)  Stress: Stress Concern Present (04/08/2023)  Tobacco  Use: High Risk (05/20/2023)  Health Literacy: Adequate Health Literacy (04/08/2023)   SDOH Interventions:     Readmission Risk Interventions     No data to display

## 2023-05-20 NOTE — ED Triage Notes (Signed)
 Brought in via EMS, hx of COPD, last 4 days increases SOB and chest pain 88% RA, normally on 2LNC and went to 97% She is normally on O2 at home, she did 2 duo nebs before EMS came and it did not help her. 180/74 82HR 22R CBG 281

## 2023-05-20 NOTE — ED Notes (Signed)
 CCMD called patient placed on monitor

## 2023-05-20 NOTE — Progress Notes (Signed)
 PHARMACY - ANTICOAGULATION CONSULT NOTE  Pharmacy Consult for warfarin management during hospitalization.  Indication: atrial fibrillation  Allergies  Allergen Reactions   Dexilant [Dexlansoprazole] Other (See Comments)    Made acid reflux worse   Isovue [Iopamidol] Itching and Rash   Lorazepam Other (See Comments)    Patient's sister noted that ativan caused the patient to become extremely confused during hospitalization 09/2010; tolerates Xanax   Oxycontin [Oxycodone] Other (See Comments)    headache   Tramadol Hcl Swelling    Ankle swelling    Patient Measurements: Height: 5\' 2"  (157.5 cm) Weight: 64 kg (141 lb) IBW/kg (Calculated) : 50.1 HEPARIN DW (KG): 63  Vital Signs: Temp: 98.3 F (36.8 C) (04/09 0526) Temp Source: Oral (04/09 0526) BP: 136/64 (04/09 0845) Pulse Rate: 96 (04/09 0845)  Labs: Recent Labs    05/19/23 1317 05/20/23 0215 05/20/23 0507  HGB 8.3* 8.5*  --   HCT 29.9* 31.1*  --   PLT 235 242  --   CREATININE 0.69 0.66  --   TROPONINIHS  --  15 16    Estimated Creatinine Clearance: 54.2 mL/min (by C-G formula based on SCr of 0.66 mg/dL).   Medical History: Past Medical History:  Diagnosis Date   Abnormality of lung on CXR 02/02/2020   Nonspecific finding on CXR ordered by pulmonologist - c/w inflammation vs infection, f/u imaging suggested, Dr. Roxy Cedar office has been in communication about recommended next steps.   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   Non-bleeding when visualized on capsule endoscopy 06/30/2015    Arthritis    "lower back; hands" (02/19/2018)   Asthma    Asymptomatic cholelithiasis 09/25/2015   Seen on CT scan 08/2015   Carotid artery stenosis; s/p R endarterectomy    s/p right endarterectomy (06/2010) Carotid US (07/2010):  Left: Moderate-to-severe (60-79%) calcific and non-calcific plaque  origin and proximal ICA and ECA    Chronic congestive heart failure with left ventricular diastolic dysfunction (HCC) 10/21/2010   Chronic constipation 02/03/2011   Chronic daily headache 01/16/2014   Chronic iron deficiency anemia    Chronic low back pain 10/06/2012   Chronic venous insufficiency 08/04/2012   Closed fracture of one rib of left side 08/23/2021   ED visit after a fall 08/16/21    COPD (chronic obstructive pulmonary disease) with emphysema (HCC)    PFTs 2018: severe obstructive disease, insignif response to bronchiodilator, mild restriction parenchymal pattern, moderately severe diffusion defect. 2014  FEV1 0.92 (40%), ratio 69, 27% increase in FEV1 with BD, TLC 91%, severe airtrapping, DLCO49% On chronic home O2. Pulmonary rehab referral 05/2012    Dyspnea    Fibromyalgia 08/29/2010   Gastroesophageal reflux disease    History of blood transfusion    "several times"  (02/19/2018)   History of clear cell renal cell carcinoma (HCC), in remission 07/21/2011   s/p cryoablation of left RCC in 09/2011 by Dr. Fredia Sorrow. Followed by Dr. Retta Diones  [facility]) .     History of fracture of left hip 10/17/2022   History of hiatal hernia    History of mitral valve replacement with bioprosthetic valve due to mitral stenosis 2012   s/p MVR with a 27-mm pericardial porcine valve (Medtronic Mosaic valve, serial #[id number] on 09/20/10, Dr. Donata Clay)    History of obstructive sleep apnea, resolved 2013   resolved per sleep study 07/2019; no apnea, but did have  desaturation.  CPAP no longer necessary.  Nocturnal polysomnography (06/2009): Moderate sleep apnea/ hypopnea syndrome , AHI 17.8 per hour with nonpositional hypopneas. CPAP titration to 12 CWP, AHI 2.4 per hour. On nocturnal CPAP via a small resMed Quattro full-face mask with heated humidifier.    History of pneumonia    "once"  (02/19/2018)   History of seborrheic keratosis 09/28/2015   History of stroke without residual deficits  01/17/2022   Hyperlipidemia LDL goal < 100 11/20/2005   Internal and external hemorrhoids without complication 08/04/2012   Lesion of left native kidney 06/01/2020   Incidental finding on recent screening chest CT for lung ca ordered by Dr. Maple Hudson pulmonology - he has ordered a f/u renal U/S.    Lichen sclerosus of female genitalia 01/12/2017   Migraine    "none in years" (02/19/2018)   Mild cognitive impairment with memory loss 12/23/2021   Moderately severe major depression (HCC) 11/19/2005   Nocturnal hypoxia per sleep study 07/2019    Osteoporosis    DEXA 2016: T -2.7; DEXA (12/09/2011): L-spine T -3.7, left hip T -1.4 DEXA (12/2004): L-spine T -2.6, left hip -0.1    Paroxysmal atrial fibrillation (HCC) 10/22/2010   s/p Left atrial maze procedure for paroxysmal atrial fibrillation on 09/20/2010 by Dr Donata Clay.  Subsequent splenic infarct, decision was made to re-anticoagulate with coumadin, likely life-long as this is the most likely cause of the splenic infarct.    Personal history of colonic polyps 05/14/2011   Colonoscopy (05/2011): 4 mm adenomatous polyp excised endoscopically Colonoscopy (02/2002): Adenomatous polyp excised endoscopically    Personal history of renal cell carcinoma 09/12/2020   Pneumonia    Pulmonary hypertension due to chronic obstructive pulmonary disease (HCC) 04/25/2016   2014 TEE w PA peak pressure 46 mmHg, s/p MV replacement    Right nephrolithiasis, asymptomatic, incidental finding 09/06/2014   5 mm non-obstructing calculus seen on CT scan 09/05/2014    Right ventricular failure (HCC) 04/25/2016   Severe obesity (BMI 35.0-39.9) with comorbidity (HCC) 10/23/2011   Sleep apnea    Stage 2 chronic kidney disease due to type 2 diabetes mellitus (HCC) 12/16/2018   CKD stage III     Tobacco abuse 07/28/2012   Type 2 diabetes mellitus with complication, with long-term current use of insulin (HCC)    Type 2 diabetes mellitus with diabetic neuropathy (HCC)    Weight  loss due to medication 03/12/2023    Medications:  Scheduled:   ALPRAZolam  0.5 mg Oral QHS   buPROPion  150 mg Oral BH-q7a   furosemide  20 mg Oral Daily   gabapentin  600 mg Oral QHS   HYDROcodone-acetaminophen  2 tablet Oral BID   insulin aspart  0-15 Units Subcutaneous TID WC   insulin glargine  30 Units Subcutaneous q AM   ipratropium-albuterol  3 mL Nebulization Q6H   ramelteon  8 mg Oral QHS   [START ON 05/21/2023] venlafaxine XR  150 mg Oral Q breakfast   warfarin  2.5 mg Oral Daily    Assessment: [name] is a 73 y.o. female with medical history of  paroxysmal atrial fibrillation. She performs patient self-testing, point of care, finger-stick INRs every Monday and reports her INR value to me for dosing recommendations. Her INR performed on Monday 05/18/23 was 1.1, having take the previous week 15 mg total of warfarin as:  Su-2.5mg M-2.5mg T-2.5mg W-1.25mg Th-2.5mg F-1.25mg Sa-2.5mg . Based upon the INR on Monday 05/18/23, her total weekly dosage of warfarin was increased to 16.25 mg warfarin using  her green-colored, 2.5 mg warfarin tablets as follows:  Su-2.5mg M-2.5mg T-2.5mg W-2.5mg Th-2.5mg F-1.25mg Sa-2.5mg .  She endorsed at that time that she was to begin a short "burst" of oral prednisone, known to have an effect of increasing the INR. Because of this potential, her total weekly dosage was cautiously increased. Hemoglobin on 05/19/23 is reported as 8.5 gm/dL up from 8.3 gm/dL yesterday. Alb is 3.7 gm/dL.   Goal of Therapy:  INR 2.0 - 2.5 as per her primary care physician, Dr. Zenaida Deed. Williams. The indication is for reducing the risk of stroke in the setting of non-valvular atrial fibrillation.  Monitor platelets by anticoagulation protocol: Yes   Plan:  Will use the dosing strategy/recommendation that she was provided on Monday 05/18/23. Will evaluate dose-response daily and observe for signs/symptoms of bleeding.   [name] 05/20/2023,9:14 AM

## 2023-05-20 NOTE — Telephone Encounter (Signed)
Pt is currently in the ER. 

## 2023-05-20 NOTE — Telephone Encounter (Signed)
 Spoke with patient confirming upcoming appointment

## 2023-05-20 NOTE — H&P (Signed)
 Date: 05/20/2023               Patient Name:  [name] MRN: [id number]  DOB: 15-Jul-1950 Age / Sex: 73 y.o., female   PCP: Miguel Aschoff, MD         Medical Service: Internal Medicine Teaching Service         Attending Physician: Dr. Dickie La, MD      First Contact: Dr. Morrie Sheldon, MD Pager [phone]    Second Contact: Dr. Olegario Messier, MD          After Hours (After 5p/  First Contact Pager: [phone]  weekends / holidays): Second Contact Pager: [phone]   SUBJECTIVE   Chief Complaint: Weakness  History of Present Illness:   [name] is a 73 year old female with past medical history of COPD, atrial fibrillation on chronic anticoagulation, and type 2 diabetes who presented to the emergency department on 4/8 with concerns for shortness of breath. She was in her usual state of health when she went to her saw Dr. Candise Che yesterday morning about getting her iron infusions set up, and at that visit they told her her blood pressure was low (I don't see a blood pressure documented unfortunately). When she returned home, all of a sudden she got really winded  couldn't do anything like walk to the bathroom without losing her breath. She does endorse a cough, however she states it's her "smokers cough". She does state that she has had one subjective fever where she felt warmer than normal, however did not take her temperature. She does state that for the last three weeks she has been smoking more than her usual set of cigarettes, may be linked to stress, and has been using her albuterol inhaler more than usual.   She also describes lower chest pain, accompanied with nausea. She states the pain radiates towards her back, and feels sharp.   ED Course: Given IV Solumedrol, 40mg  IV Lasix, started on duoneb  Meds:  Xanax .5mg  at bedtime  Wellbutrin 150mg   Trelegy 1 puff daily Lasix half a tablet daily 20mg   Gabapentin 600mg  daily at bedtime  Vicodin BID Tresiba  30U Metoprolol 25mg  Omeprazole 40mg  BID Ramelteon 8mg   Venlafaxine 150mg  Warfarin 2.5mg  (Monday next check, Friday take half a tablet)   Past Medical History  Past Surgical History:  Procedure Laterality Date   BIOPSY  07/19/2021   Procedure: BIOPSY;  Surgeon: Vida Rigger, MD;  Location: [facility] ENDOSCOPY;  Service: Gastroenterology;;   CARDIAC CATHETERIZATION     CARDIAC VALVE REPLACEMENT  Aug. 2012   "mitral valve"   CAROTID ENDARTERECTOMY Right 07/04/2010   by Dr. Myra Gianotti for asymptomatic right carotid artery stenosis   CATARACT EXTRACTION W/ INTRAOCULAR LENS  IMPLANT, BILATERAL Bilateral    CHEST TUBE INSERTION  09/24/2010   Dr Zenaida Niece Trigt   COLONOSCOPY  05/12/2011   performed by Dr. Bosie Clos. Showing small internal hemorrhoids, single tubular adenoma polyp   COLONOSCOPY WITH PROPOFOL N/A 05/29/2020   Procedure: COLONOSCOPY WITH PROPOFOL;  Surgeon: Vida Rigger, MD;  Location: [facility] END[facility];  Service: Endoscopy;  Laterality: N/A;   COLONOSCOPY WITH PROPOFOL N/A 07/19/2021   Procedure: COLONOSCOPY WITH PROPOFOL;  Surgeon: Vida Rigger, MD;  Location: [facility];  Service: Gastroenterology;  Laterality: N/A;   CRYOABLATION Left 09/2011   by Dr. Fredia Sorrow. Followed by Dr. Retta Diones  [facility]) .     DILATION AND CURETTAGE OF UTERUS     ESOPHAGOGASTRODUODENOSCOPY  05/12/2011  performed by Dr. Bosie Clos. Negative for ulcerations, biopsy negative for evidence of celiac sprue   ESOPHAGOGASTRODUODENOSCOPY N/A 06/29/2015   Procedure: ESOPHAGOGASTRODUODENOSCOPY (EGD);  Surgeon: Vida Rigger, MD;  Location: [facility] ENDOSCOPY;  Service: Endoscopy;  Laterality: N/A;   ESOPHAGOGASTRODUODENOSCOPY N/A 03/29/2016   Procedure: ESOPHAGOGASTRODUODENOSCOPY (EGD);  Surgeon: Vida Rigger, MD;  Location: [facility] ENDOSCOPY;  Service: Endoscopy;  Laterality: N/A;   ESOPHAGOGASTRODUODENOSCOPY (EGD) WITH PROPOFOL N/A 05/29/2020   Procedure: ESOPHAGOGASTRODUODENOSCOPY (EGD) WITH PROPOFOL;  Surgeon: Vida Rigger, MD;  Location: [facility]  ENDOSCOPY;  Service: Endoscopy;  Laterality: N/A;   FRACTURE SURGERY     GIVENS CAPSULE STUDY N/A 06/30/2015   Procedure: GIVENS CAPSULE STUDY;  Surgeon: Vida Rigger, MD;  Location: [facility] ENDOSCOPY;  Service: Endoscopy;  Laterality: N/A;   GIVENS CAPSULE STUDY N/A 06/29/2015   Procedure: GIVENS CAPSULE STUDY;  Surgeon: Vida Rigger, MD;  Location: [facility] ENDOSCOPY;  Service: Endoscopy;  Laterality: N/A;   HEMORRHOID SURGERY  1970s?   "lanced"   HYSTEROSCOPY W/ ENDOMETRIAL ABLATION  06/2001   for persistent post-menopausal bleeding // by S. Kyra Manges, M.D.   INTRAMEDULLARY (IM) NAIL INTERTROCHANTERIC Left 10/19/2022   Procedure: LEFT INTRAMEDULLARY (IM) NAIL INTERTROCHANTERIC;  Surgeon: Luci Bank, MD;  Location: [facility] OR;  Service: Orthopedics;  Laterality: Left;   IR GENERIC HISTORICAL  08/23/2015   IR RADIOLOGIST EVAL & MGMT 08/23/2015 Irish Lack, MD [facility] INTERV RAD   IR GENERIC HISTORICAL  04/09/2016   IR RADIOLOGIST EVAL & MGMT 04/09/2016 Irish Lack, MD [facility] INTERV RAD   IR RADIOLOGIST EVAL & MGMT  10/07/2016   IR RADIOLOGIST EVAL & MGMT  06/25/2017   IR RADIOLOGIST EVAL & MGMT  07/31/2020   IR RADIOLOGIST EVAL & MGMT  10/02/2020   IR RADIOLOGIST EVAL & MGMT  12/20/2020   IR RADIOLOGIST EVAL & MGMT  12/25/2021   IR RADIOLOGIST EVAL & MGMT  04/16/2023   LEFT HEART CATH AND CORONARY ANGIOGRAPHY N/A 04/21/2016   Procedure: Left Heart Cath and Coronary Angiography;  Surgeon: Runell Gess, MD;  Location: [facility] INVASIVE CV LAB;  Service: Cardiovascular;  Laterality: N/A;   LIPOMA EXCISION  08/2005   occipital lipoma 1.5cm - by Dr. Maryagnes Amos   LITHOTRIPSY  ~ 2000   MAZE Left 09/20/10   for paroxysmal atrial fibrillation (Dr. Donata Clay)   MITRAL VALVE REPLACEMENT  09/20/10    with a 27-mm pericardial porcine valve (Medtronic Mosaic valve, serial #[id number]). 09/20/10, Dr Donata Clay   ORIF CLAVICLE FRACTURE Right 01/2004   by Veverly Fells. Ophelia Charter, M.D for Right clavicle nonunion.; "it's got a pin in it"    POLYPECTOMY  05/29/2020   Procedure: POLYPECTOMY;  Surgeon: Vida Rigger, MD;  Location: [facility] ENDOSCOPY;  Service: Endoscopy;;   POLYPECTOMY  07/19/2021   Procedure: POLYPECTOMY;  Surgeon: Vida Rigger, MD;  Location: [facility] ENDOSCOPY;  Service: Gastroenterology;;   RADIOLOGY WITH ANESTHESIA Left 09/12/2020   Procedure: RADIOLOGY WITH ANESTHESIA- RENAL CRYOABLATION;  Surgeon: Irish Lack, MD;  Location: [facility] ORS;  Service: Radiology;  Laterality: Left;   REFRACTIVE SURGERY Bilateral    RIGHT HEART CATH N/A 04/23/2016   Procedure: Right Heart Cath;  Surgeon: Laurey Morale, MD;  Location: [facility] INVASIVE CV LAB;  Service: Cardiovascular;  Laterality: N/A;   TONSILLECTOMY     TUBAL LIGATION      Social:  Lives With: By herself, with her cat, lives in a house  Occupation: Retired Support: Sister  Level of Function: Independent  PCP: Dr. [name]  Substances: A pack a  day, increased 3 weeks ago, between half a pack and a pack  Family History:   Father: Passed from a heart attack  Brother: MI    Allergies: Allergies as of 05/20/2023 - Review Complete 05/20/2023  Allergen Reaction Noted   Dexilant [dexlansoprazole] Other (See Comments) 11/08/2019   Isovue [iopamidol] Itching and Rash 04/10/2023   Lorazepam Other (See Comments) 11/04/2010   Oxycontin [oxycodone] Other (See Comments) 05/03/2012   Tramadol hcl Swelling     Review of Systems: A complete ROS was negative except as per HPI.   OBJECTIVE:   Physical Exam: Blood pressure (!) 156/80, pulse 66, temperature 98.3 F (36.8 C), temperature source Oral, resp. rate 20, height 5\' 2"  (1.575 m), weight 64 kg, SpO2 100%.  Constitutional: well-appearing female, on 3L of O2, laying in bed, in no acute distress HENT: normocephalic atraumatic, mucous membranes moist Eyes: conjunctiva non-erythematous Neck: supple Cardiovascular: irregular rate and rhythm, no m/r/g Pulmonary/Chest: Wheezes heard bilaterally throughout lung fields, some rales  present as well in lower lung bases Abdominal: soft, non-tender, non-distended, normoactive bowel sounds MSK: normal bulk and tone Neurological: alert & oriented x 3, 5/5 strength in bilateral upper and lower extremities, normal gait Skin: warm and dry Psych: normal mood and affect  Labs: CBC    Component Value Date/Time   WBC 10.0 05/20/2023 0215   RBC 4.08 05/20/2023 0215   HGB 8.5 (L) 05/20/2023 0215   HGB 8.3 (L) 05/19/2023 1317   HGB 9.7 (L) 04/07/2023 1020   HGB 12.4 01/26/2012 0851   HCT 31.1 (L) 05/20/2023 0215   HCT 34.0 04/07/2023 1020   HCT 36.5 01/26/2012 0851   PLT 242 05/20/2023 0215   PLT 235 05/19/2023 1317   PLT 201 04/07/2023 1020   MCV 76.2 (L) 05/20/2023 0215   MCV 77 (L) 04/07/2023 1020   MCV 94.5 01/26/2012 0851   MCH 20.8 (L) 05/20/2023 0215   MCHC 27.3 (L) 05/20/2023 0215   RDW 18.8 (H) 05/20/2023 0215   RDW 16.8 (H) 04/07/2023 1020   RDW 16.0 (H) 01/26/2012 0851   LYMPHSABS 1.2 05/20/2023 0215   LYMPHSABS 2.1 09/01/2019 1022   LYMPHSABS 1.8 01/26/2012 0851   MONOABS 0.9 05/20/2023 0215   MONOABS 0.9 01/26/2012 0851   EOSABS 0.1 05/20/2023 0215   EOSABS 0.1 09/01/2019 1022   BASOSABS 0.1 05/20/2023 0215   BASOSABS 0.1 09/01/2019 1022   BASOSABS 0.0 01/26/2012 0851     CMP     Component Value Date/Time   NA 138 05/20/2023 0215   NA 141 03/12/2023 1048   K 3.9 05/20/2023 0215   CL 104 05/20/2023 0215   CO2 27 05/20/2023 0215   GLUCOSE 218 (H) 05/20/2023 0215   BUN 12 05/20/2023 0215   BUN 14 03/12/2023 1048   CREATININE 0.66 05/20/2023 0215   CREATININE 0.69 05/19/2023 1317   CREATININE 0.91 08/21/2014 1027   CALCIUM 9.0 05/20/2023 0215   PROT 6.2 (L) 05/19/2023 1317   PROT 5.8 (L) 01/30/2022 1155   ALBUMIN 3.7 05/19/2023 1317   ALBUMIN 3.7 (L) 01/30/2022 1155   AST 9 (L) 05/19/2023 1317   ALT 9 05/19/2023 1317   ALKPHOS 50 05/19/2023 1317   BILITOT 0.5 05/19/2023 1317   GFRNONAA >60 05/20/2023 0215   GFRNONAA >60 05/19/2023  1317   GFRNONAA 66 08/21/2014 1027   GFRAA 58 (L) 09/26/2019 1317   GFRAA 77 08/21/2014 1027    Imaging:  Cardiomegaly and emphysema, no acute changes  EKG: personally reviewed my  interpretation is atrial fibrillation with some ST depression, similar to previous EKG  ASSESSMENT & PLAN:   Assessment & Plan by Problem: Principal Problem:   Hypertensive urgency Active Problems:   Acute exacerbation of chronic obstructive pulmonary disease (COPD) (HCC)   [name] is a 73 y.o. person living with a history of atrial fibrillation on warfarin, T2DM, COPD who presented with shortness of breath and admitted for COPD Exacerbation on hospital day 0   #Hypertensive Urgency  #Atypical Chest Pain Pt with recurrent bouts of elevated blood pressure, currently on nitroglycerin drip for adequate control. Seems liek her blood pressure has been very labile in the emergency department ranging from 119 systolic to 200. Etiology currently unclear, the only anti-hypertensive she is taking is metoprolol 25mg  and lasix 20mg . She has trialed ACEI in the past but it was stopped because she was normotensive. While in the room, her blood pressure was between 150-160 systolic, much improved than the 200 systolic recorded earlier this morning. I think she'll be able to come off the drip soon, and can trial her on amlodipine. I think her chest pain is also due to her blood pressure, but will check an echo and repeat EKG for further evaluation.   Plan:  - Continue drip for now, wean within the next couple hours  - Once weaned off, can start amlodipine 5mg    - Getting echo, repeat EKG  #Hx of COPD Exacerbation  Pt with recent increase use of cigarettes, wheezes on exam, and improvement after duoneb treatment seems. She only has 1/3 of the cardinal symptoms for an exacerbation, so will hold off on more steroids. She has been given IV solumedrol as well in the emergency department. Do not think this is a  HF exacerbation, with no signs of volume overload or swelling on exam. She is currently still using cigarettes daily. Will treat symptomatically with duonebs.   Plan:  - Given one dose of IV solumedrol around 2AM - Continue duonebs  #Atrial Fibrillation  Continuing her warfarin, discussed with Dr. Alexandria Lodge who will manage warfarin while inpatient.   #Type 2 DM Restarted home 30U long acting, will continue with sliding scale insulin  #Chronic Pain  Has pain contract with our clinic, last refill on 3/28 for her norco. Will continue.  #Depression  Continuing home venlafaxine, Xanax, and wellbutrin  #Chronic Diastolic Heart Failure  Given IV Lasix in the ED, home dose is 20mg  oral. I don't think she's volume overloaded on exam    Diet: Normal VTE: DOAC IVF: None,None Code: Full  Prior to Admission Living Arrangement: Home, living alone Anticipated Discharge Location: Home Barriers to Discharge: Medical management  Dispo: Admit patient to Inpatient with expected length of stay greater than 2 midnights.  Signed: Olegario Messier, MD Internal Medicine Resident PGY-2  05/20/2023, 10:50 AM

## 2023-05-20 NOTE — Progress Notes (Addendum)
 [name], RPH-CPP Pharmacist Pharmacy   Progress Notes    Incomplete   Date of Service: 05/20/2023  9:14 AM   Incomplete     Expand All Collapse All  PHARMACY - ANTICOAGULATION CONSULT NOTE   Pharmacy Consult for warfarin management during hospitalization.  Indication: atrial fibrillation   Allergies       Allergies  Allergen Reactions   Dexilant [Dexlansoprazole] Other (See Comments)      Made acid reflux worse   Isovue [Iopamidol] Itching and Rash   Lorazepam Other (See Comments)      Patient's sister noted that ativan caused the patient to become extremely confused during hospitalization 09/2010; tolerates Xanax   Oxycontin [Oxycodone] Other (See Comments)      headache   Tramadol Hcl Swelling      Ankle swelling        Patient Measurements: Height: 5\' 2"  (157.5 cm) Weight: 64 kg (141 lb) IBW/kg (Calculated) : 50.1 HEPARIN DW (KG): 63   Vital Signs: Temp: 98.3 F (36.8 C) (04/09 0526) Temp Source: Oral (04/09 0526) BP: 136/64 (04/09 0845) Pulse Rate: 96 (04/09 0845)   Labs: Recent Labs (last 2 labs)       Recent Labs    05/19/23 1317 05/20/23 0215 05/20/23 0507  HGB 8.3* 8.5*  --   HCT 29.9* 31.1*  --   PLT 235 242  --   CREATININE 0.69 0.66  --   TROPONINIHS  --  15 16        Estimated Creatinine Clearance: 54.2 mL/min (by C-G formula based on SCr of 0.66 mg/dL).     Medical History:     Past Medical History:  Diagnosis Date   Abnormality of lung on CXR 02/02/2020    Nonspecific finding on CXR ordered by pulmonologist - c/w inflammation vs infection, f/u imaging suggested, Dr. Roxy Cedar office has been in communication about recommended next steps.   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    Non-bleeding when visualized on capsule endoscopy 06/30/2015    Arthritis      "lower back; hands" (02/19/2018)    Asthma     Asymptomatic cholelithiasis 09/25/2015    Seen on CT scan 08/2015   Carotid artery stenosis; s/p R endarterectomy      s/p right endarterectomy (06/2010) Carotid US (07/2010):  Left: Moderate-to-severe (60-79%) calcific and non-calcific plaque origin and proximal ICA and ECA    Chronic congestive heart failure with left ventricular diastolic dysfunction (HCC) 10/21/2010   Chronic constipation 02/03/2011   Chronic daily headache 01/16/2014   Chronic iron deficiency anemia     Chronic low back pain 10/06/2012   Chronic venous insufficiency 08/04/2012   Closed fracture of one rib of left side 08/23/2021    ED visit after a fall 08/16/21    COPD (chronic obstructive pulmonary disease) with emphysema (HCC)      PFTs 2018: severe obstructive disease, insignif response to bronchiodilator, mild restriction parenchymal pattern, moderately severe diffusion defect. 2014  FEV1 0.92 (40%), ratio 69, 27% increase in FEV1 with BD, TLC 91%, severe airtrapping, DLCO49% On chronic home O2. Pulmonary rehab referral 05/2012    Dyspnea     Fibromyalgia 08/29/2010   Gastroesophageal reflux disease     History of blood transfusion      "several times"  (02/19/2018)   History of clear cell renal cell carcinoma (HCC), in remission  07/21/2011    s/p cryoablation of left RCC in 09/2011 by Dr. Fredia Sorrow. Followed by Dr. Retta Diones  [facility]) .     History of fracture of left hip 10/17/2022   History of hiatal hernia     History of mitral valve replacement with bioprosthetic valve due to mitral stenosis 2012    s/p MVR with a 27-mm pericardial porcine valve (Medtronic Mosaic valve, serial #[id number] on 09/20/10, Dr. Donata Clay)    History of obstructive sleep apnea, resolved 2013    resolved per sleep study 07/2019; no apnea, but did have desaturation.  CPAP no longer necessary.  Nocturnal polysomnography (06/2009): Moderate sleep apnea/ hypopnea syndrome , AHI 17.8 per hour with nonpositional hypopneas.  CPAP titration to 12 CWP, AHI 2.4 per hour. On nocturnal CPAP via a small resMed Quattro full-face mask with heated humidifier.    History of pneumonia      "once"  (02/19/2018)   History of seborrheic keratosis 09/28/2015   History of stroke without residual deficits 01/17/2022   Hyperlipidemia LDL goal < 100 11/20/2005   Internal and external hemorrhoids without complication 08/04/2012   Lesion of left native kidney 06/01/2020    Incidental finding on recent screening chest CT for lung ca ordered by Dr. Maple Hudson pulmonology - he has ordered a f/u renal U/S.    Lichen sclerosus of female genitalia 01/12/2017   Migraine      "none in years" (02/19/2018)   Mild cognitive impairment with memory loss 12/23/2021   Moderately severe major depression (HCC) 11/19/2005   Nocturnal hypoxia per sleep study 07/2019     Osteoporosis      DEXA 2016: T -2.7; DEXA (12/09/2011): L-spine T -3.7, left hip T -1.4 DEXA (12/2004): L-spine T -2.6, left hip -0.1    Paroxysmal atrial fibrillation (HCC) 10/22/2010    s/p Left atrial maze procedure for paroxysmal atrial fibrillation on 09/20/2010 by Dr Donata Clay.  Subsequent splenic infarct, decision was made to re-anticoagulate with coumadin, likely life-long as this is the most likely cause of the splenic infarct.    Personal history of colonic polyps 05/14/2011    Colonoscopy (05/2011): 4 mm adenomatous polyp excised endoscopically Colonoscopy (02/2002): Adenomatous polyp excised endoscopically    Personal history of renal cell carcinoma 09/12/2020   Pneumonia     Pulmonary hypertension due to chronic obstructive pulmonary disease (HCC) 04/25/2016    2014 TEE w PA peak pressure 46 mmHg, s/p MV replacement    Right nephrolithiasis, asymptomatic, incidental finding 09/06/2014    5 mm non-obstructing calculus seen on CT scan 09/05/2014    Right ventricular failure (HCC) 04/25/2016   Severe obesity (BMI 35.0-39.9) with comorbidity (HCC) 10/23/2011   Sleep apnea     Stage 2  chronic kidney disease due to type 2 diabetes mellitus (HCC) 12/16/2018    CKD stage III     Tobacco abuse 07/28/2012   Type 2 diabetes mellitus with complication, with long-term current use of insulin (HCC)     Type 2 diabetes mellitus with diabetic neuropathy (HCC)     Weight loss due to medication 03/12/2023          Medications:  Scheduled:   ALPRAZolam  0.5 mg Oral QHS   buPROPion  150 mg Oral BH-q7a   furosemide  20 mg Oral Daily   gabapentin  600 mg Oral QHS   HYDROcodone-acetaminophen  2 tablet Oral BID   insulin aspart  0-15 Units Subcutaneous TID WC   insulin glargine  30  Units Subcutaneous q AM   ipratropium-albuterol  3 mL Nebulization Q6H   ramelteon  8 mg Oral QHS   [START ON 05/21/2023] venlafaxine XR  150 mg Oral Q breakfast   warfarin  2.5 mg Oral Daily          Assessment: Jonaya Freshour is a 73 y.o. female with medical history of  paroxysmal atrial fibrillation. She performs patient self-testing, point of care, finger-stick INRs every Monday and reports her INR value to me for dosing recommendations. Her INR performed on Monday 05/18/23 was 1.1, having taken the previous week 15 mg total of warfarin as:  Su-2.5mg M-2.5mg T-2.5mg W-1.25mg Th-2.5mg F-1.25mg Sa-2.5mg .   Based upon the INR on Monday 05/18/23, her total weekly dosage of warfarin was increased to 16.25 mg warfarin using her green-colored, 2.5 mg warfarin tablets as follows:  Su-2.5mg M-2.5mg T-2.5mg W-2.5mg Th-2.5mg F-1.25mg Sa-2.5mg .  She endorsed at that time that she was to begin a short "burst" of oral prednisone, known to have an effect of increasing the INR. Because of this potential, her total weekly dosage was cautiously increased. Hemoglobin on 05/19/23 is reported as 8.5 gm/dL up from 8.3 gm/dL yesterday. Alb is 3.7 gm/dL.    Goal of Therapy:  INR 2.0 - 2.5 as per her primary care physician, Dr. Zenaida Deed. Williams. The indication is for reducing the risk of stroke in the setting of  non-valvular atrial fibrillation.  Monitor platelets by anticoagulation protocol: Yes   Plan:  Will use the dosing strategy/recommendation that she was provided on Monday 05/18/23. Will evaluate dose-response daily and observe for signs/symptoms of bleeding.    [name], PharmD, CPP Clinical Pharmacist Practitioner 05/20/2023,9:14 AM

## 2023-05-20 NOTE — Progress Notes (Deleted)
 [name], RPH-CPP Pharmacist Pharmacy   Progress Notes    Incomplete   Date of Service: 05/20/2023  9:14 AM   Incomplete     Expand All Collapse All  PHARMACY - ANTICOAGULATION CONSULT NOTE   Pharmacy Consult for warfarin management during hospitalization.  Indication: atrial fibrillation   Allergies       Allergies  Allergen Reactions   Dexilant [Dexlansoprazole] Other (See Comments)      Made acid reflux worse   Isovue [Iopamidol] Itching and Rash   Lorazepam Other (See Comments)      Patient's sister noted that ativan caused the patient to become extremely confused during hospitalization 09/2010; tolerates Xanax   Oxycontin [Oxycodone] Other (See Comments)      headache   Tramadol Hcl Swelling      Ankle swelling        Patient Measurements: Height: 5\' 2"  (157.5 cm) Weight: 64 kg (141 lb) IBW/kg (Calculated) : 50.1 HEPARIN DW (KG): 63   Vital Signs: Temp: 98.3 F (36.8 C) (04/09 0526) Temp Source: Oral (04/09 0526) BP: 136/64 (04/09 0845) Pulse Rate: 96 (04/09 0845)   Labs: Recent Labs (last 2 labs)       Recent Labs    05/19/23 1317 05/20/23 0215 05/20/23 0507  HGB 8.3* 8.5*  --   HCT 29.9* 31.1*  --   PLT 235 242  --   CREATININE 0.69 0.66  --   TROPONINIHS  --  15 16        Estimated Creatinine Clearance: 54.2 mL/min (by C-G formula based on SCr of 0.66 mg/dL).     Medical History:     Past Medical History:  Diagnosis Date   Abnormality of lung on CXR 02/02/2020    Nonspecific finding on CXR ordered by pulmonologist - c/w inflammation vs infection, f/u imaging suggested, Dr. Roxy Cedar office has been in communication about recommended next steps.   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    Non-bleeding when visualized on capsule endoscopy 06/30/2015    Arthritis      "lower back; hands" (02/19/2018)    Asthma     Asymptomatic cholelithiasis 09/25/2015    Seen on CT scan 08/2015   Carotid artery stenosis; s/p R endarterectomy      s/p right endarterectomy (06/2010) Carotid US (07/2010):  Left: Moderate-to-severe (60-79%) calcific and non-calcific plaque origin and proximal ICA and ECA    Chronic congestive heart failure with left ventricular diastolic dysfunction (HCC) 10/21/2010   Chronic constipation 02/03/2011   Chronic daily headache 01/16/2014   Chronic iron deficiency anemia     Chronic low back pain 10/06/2012   Chronic venous insufficiency 08/04/2012   Closed fracture of one rib of left side 08/23/2021    ED visit after a fall 08/16/21    COPD (chronic obstructive pulmonary disease) with emphysema (HCC)      PFTs 2018: severe obstructive disease, insignif response to bronchiodilator, mild restriction parenchymal pattern, moderately severe diffusion defect. 2014  FEV1 0.92 (40%), ratio 69, 27% increase in FEV1 with BD, TLC 91%, severe airtrapping, DLCO49% On chronic home O2. Pulmonary rehab referral 05/2012    Dyspnea     Fibromyalgia 08/29/2010   Gastroesophageal reflux disease     History of blood transfusion      "several times"  (02/19/2018)   History of clear cell renal cell carcinoma (HCC), in remission  07/21/2011    s/p cryoablation of left RCC in 09/2011 by Dr. Fredia Sorrow. Followed by Dr. Retta Diones  [facility]) .     History of fracture of left hip 10/17/2022   History of hiatal hernia     History of mitral valve replacement with bioprosthetic valve due to mitral stenosis 2012    s/p MVR with a 27-mm pericardial porcine valve (Medtronic Mosaic valve, serial #[id number] on 09/20/10, Dr. Donata Clay)    History of obstructive sleep apnea, resolved 2013    resolved per sleep study 07/2019; no apnea, but did have desaturation.  CPAP no longer necessary.  Nocturnal polysomnography (06/2009): Moderate sleep apnea/ hypopnea syndrome , AHI 17.8 per hour with nonpositional hypopneas.  CPAP titration to 12 CWP, AHI 2.4 per hour. On nocturnal CPAP via a small resMed Quattro full-face mask with heated humidifier.    History of pneumonia      "once"  (02/19/2018)   History of seborrheic keratosis 09/28/2015   History of stroke without residual deficits 01/17/2022   Hyperlipidemia LDL goal < 100 11/20/2005   Internal and external hemorrhoids without complication 08/04/2012   Lesion of left native kidney 06/01/2020    Incidental finding on recent screening chest CT for lung ca ordered by Dr. Maple Hudson pulmonology - he has ordered a f/u renal U/S.    Lichen sclerosus of female genitalia 01/12/2017   Migraine      "none in years" (02/19/2018)   Mild cognitive impairment with memory loss 12/23/2021   Moderately severe major depression (HCC) 11/19/2005   Nocturnal hypoxia per sleep study 07/2019     Osteoporosis      DEXA 2016: T -2.7; DEXA (12/09/2011): L-spine T -3.7, left hip T -1.4 DEXA (12/2004): L-spine T -2.6, left hip -0.1    Paroxysmal atrial fibrillation (HCC) 10/22/2010    s/p Left atrial maze procedure for paroxysmal atrial fibrillation on 09/20/2010 by Dr Donata Clay.  Subsequent splenic infarct, decision was made to re-anticoagulate with coumadin, likely life-long as this is the most likely cause of the splenic infarct.    Personal history of colonic polyps 05/14/2011    Colonoscopy (05/2011): 4 mm adenomatous polyp excised endoscopically Colonoscopy (02/2002): Adenomatous polyp excised endoscopically    Personal history of renal cell carcinoma 09/12/2020   Pneumonia     Pulmonary hypertension due to chronic obstructive pulmonary disease (HCC) 04/25/2016    2014 TEE w PA peak pressure 46 mmHg, s/p MV replacement    Right nephrolithiasis, asymptomatic, incidental finding 09/06/2014    5 mm non-obstructing calculus seen on CT scan 09/05/2014    Right ventricular failure (HCC) 04/25/2016   Severe obesity (BMI 35.0-39.9) with comorbidity (HCC) 10/23/2011   Sleep apnea     Stage 2  chronic kidney disease due to type 2 diabetes mellitus (HCC) 12/16/2018    CKD stage III     Tobacco abuse 07/28/2012   Type 2 diabetes mellitus with complication, with long-term current use of insulin (HCC)     Type 2 diabetes mellitus with diabetic neuropathy (HCC)     Weight loss due to medication 03/12/2023          Medications:  Scheduled:   ALPRAZolam  0.5 mg Oral QHS   buPROPion  150 mg Oral BH-q7a   furosemide  20 mg Oral Daily   gabapentin  600 mg Oral QHS   HYDROcodone-acetaminophen  2 tablet Oral BID   insulin aspart  0-15 Units Subcutaneous TID WC   insulin glargine  30  Units Subcutaneous q AM   ipratropium-albuterol  3 mL Nebulization Q6H   ramelteon  8 mg Oral QHS   [START ON 05/21/2023] venlafaxine XR  150 mg Oral Q breakfast   warfarin  2.5 mg Oral Daily          Assessment: [name] is a 73 y.o. female with medical history of  paroxysmal atrial fibrillation. She performs patient self-testing, point of care, finger-stick INRs every Monday and reports her INR value to me for dosing recommendations. Her INR performed on Monday 05/18/23 was 1.1, having take the previous week 15 mg total of warfarin as:  Su-2.5mg M-2.5mg T-2.5mg W-1.25mg Th-2.5mg F-1.25mg Sa-2.5mg . Based upon the INR on Monday 05/28/23, her total weekly dosage of warfarin was increased to 16.25 mg warfarin using her green-colored, 2.5 mg warfarin tablets as follows:  Su-2.5mg M-2.5mg T-2.5mg W-2.5mg Th-2.5mg F-1.25mg Sa-2.5mg .  She endorsed at that time that she was to begin a short "burst" of oral prednisone, known to have an effect of increasing the INR. Because of this potential, her total weekly dosage was cautiously increased. Hemoglobin on 05/19/23 is reported as 8.5 gm/dL up from 8.3 gm/dL yesterday. Alb is 3.7 gm/dL.    Goal of Therapy:  INR 2.0 - 2.5 as per her primary care physician, Dr. Zenaida Deed. Williams. The indication is for reducing the risk of stroke in the setting of non-valvular  atrial fibrillation.  Monitor platelets by anticoagulation protocol: Yes   Plan:  Will use the dosing strategy/recommendation that she was provided on Monday 05/18/23. Will evaluate dose-response daily and observe for signs/symptoms of bleeding.    [name] 05/20/2023,9:14 AM

## 2023-05-20 NOTE — Discharge Instructions (Signed)

## 2023-05-21 ENCOUNTER — Inpatient Hospital Stay (HOSPITAL_COMMUNITY)

## 2023-05-21 DIAGNOSIS — Z7901 Long term (current) use of anticoagulants: Secondary | ICD-10-CM

## 2023-05-21 DIAGNOSIS — I480 Paroxysmal atrial fibrillation: Secondary | ICD-10-CM

## 2023-05-21 DIAGNOSIS — R079 Chest pain, unspecified: Secondary | ICD-10-CM

## 2023-05-21 DIAGNOSIS — R0602 Shortness of breath: Secondary | ICD-10-CM

## 2023-05-21 DIAGNOSIS — Z794 Long term (current) use of insulin: Secondary | ICD-10-CM

## 2023-05-21 DIAGNOSIS — Z953 Presence of xenogenic heart valve: Secondary | ICD-10-CM

## 2023-05-21 DIAGNOSIS — G4734 Idiopathic sleep related nonobstructive alveolar hypoventilation: Secondary | ICD-10-CM

## 2023-05-21 DIAGNOSIS — E118 Type 2 diabetes mellitus with unspecified complications: Secondary | ICD-10-CM

## 2023-05-21 DIAGNOSIS — I5032 Chronic diastolic (congestive) heart failure: Secondary | ICD-10-CM

## 2023-05-21 DIAGNOSIS — I160 Hypertensive urgency: Principal | ICD-10-CM

## 2023-05-21 DIAGNOSIS — F17210 Nicotine dependence, cigarettes, uncomplicated: Secondary | ICD-10-CM

## 2023-05-21 LAB — GLUCOSE, CAPILLARY
Glucose-Capillary: 156 mg/dL — ABNORMAL HIGH (ref 70–99)
Glucose-Capillary: 171 mg/dL — ABNORMAL HIGH (ref 70–99)
Glucose-Capillary: 255 mg/dL — ABNORMAL HIGH (ref 70–99)
Glucose-Capillary: 263 mg/dL — ABNORMAL HIGH (ref 70–99)
Glucose-Capillary: 285 mg/dL — ABNORMAL HIGH (ref 70–99)

## 2023-05-21 LAB — CBC
HCT: 30.1 % — ABNORMAL LOW (ref 36.0–46.0)
Hemoglobin: 8.3 g/dL — ABNORMAL LOW (ref 12.0–15.0)
MCH: 20.8 pg — ABNORMAL LOW (ref 26.0–34.0)
MCHC: 27.6 g/dL — ABNORMAL LOW (ref 30.0–36.0)
MCV: 75.4 fL — ABNORMAL LOW (ref 80.0–100.0)
Platelets: 245 10*3/uL (ref 150–400)
RBC: 3.99 MIL/uL (ref 3.87–5.11)
RDW: 18.8 % — ABNORMAL HIGH (ref 11.5–15.5)
WBC: 9.9 10*3/uL (ref 4.0–10.5)
nRBC: 0 % (ref 0.0–0.2)

## 2023-05-21 LAB — BASIC METABOLIC PANEL WITH GFR
Anion gap: 9 (ref 5–15)
BUN: 22 mg/dL (ref 8–23)
CO2: 28 mmol/L (ref 22–32)
Calcium: 9.1 mg/dL (ref 8.9–10.3)
Chloride: 101 mmol/L (ref 98–111)
Creatinine, Ser: 0.92 mg/dL (ref 0.44–1.00)
GFR, Estimated: >60 mL/min (ref >60)
Glucose, Bld: 177 mg/dL — ABNORMAL HIGH (ref 70–99)
Potassium: 3.6 mmol/L (ref 3.5–5.1)
Sodium: 138 mmol/L (ref 135–145)

## 2023-05-21 LAB — PROTIME-INR
INR: 1.4 — ABNORMAL HIGH (ref 0.8–1.2)
Prothrombin Time: 17.1 s — ABNORMAL HIGH (ref 11.4–15.2)

## 2023-05-21 LAB — ECHOCARDIOGRAM COMPLETE
AR max vel: 1.45 cm2
AV Area VTI: 1.71 cm2
AV Area mean vel: 1.65 cm2
AV Mean grad: 10.3 mmHg
AV Peak grad: 21.8 mmHg
Ao pk vel: 2.34 m/s
Area-P 1/2: 3.84 cm2
Height: 62 in
MV VTI: 1.8 cm2
S' Lateral: 3.3 cm
Weight: 2256 [oz_av]

## 2023-05-21 MED ORDER — ONDANSETRON HCL 4 MG/2ML IJ SOLN
4.0000 mg | Freq: Once | INTRAMUSCULAR | Status: AC
Start: 2023-05-21 — End: 2023-05-21
  Administered 2023-05-21: 4 mg via INTRAVENOUS
  Filled 2023-05-21: qty 2

## 2023-05-21 MED ORDER — METOPROLOL SUCCINATE ER 25 MG PO TB24
25.0000 mg | ORAL_TABLET | Freq: Every day | ORAL | Status: DC
  Administered 2023-05-21 – 2023-05-23 (×3): 25 mg via ORAL
  Filled 2023-05-21 (×3): qty 1

## 2023-05-21 MED ORDER — WARFARIN SODIUM 4 MG PO TABS
4.0000 mg | ORAL_TABLET | Freq: Once | ORAL | Status: AC
  Administered 2023-05-21: 4 mg via ORAL
  Filled 2023-05-21: qty 1

## 2023-05-21 MED ORDER — INSULIN ASPART 100 UNIT/ML IJ SOLN
0.0000 [IU] | Freq: Three times a day (TID) | INTRAMUSCULAR | Status: DC
  Administered 2023-05-21 (×2): 5 [IU] via SUBCUTANEOUS
  Administered 2023-05-22 (×2): 3 [IU] via SUBCUTANEOUS
  Administered 2023-05-23: 2 [IU] via SUBCUTANEOUS

## 2023-05-21 MED ORDER — LOSARTAN POTASSIUM 50 MG PO TABS
50.0000 mg | ORAL_TABLET | Freq: Every day | ORAL | Status: DC
  Administered 2023-05-21 – 2023-05-23 (×3): 50 mg via ORAL
  Filled 2023-05-21 (×3): qty 1

## 2023-05-21 MED ORDER — ENOXAPARIN SODIUM 60 MG/0.6ML IJ SOSY
60.0000 mg | PREFILLED_SYRINGE | Freq: Two times a day (BID) | INTRAMUSCULAR | Status: AC
  Administered 2023-05-21: 60 mg via SUBCUTANEOUS
  Filled 2023-05-21 (×2): qty 0.6

## 2023-05-21 NOTE — TOC Progression Note (Addendum)
 Transition of Care [facility]) - Progression Note    Patient Details  Name: [name] MRN: [id number] Date of Birth: 04/06/1950  Transition of Care [facility]) CM/SW Contact  Dellie Burns [place], [place] Phone Number: 05/21/2023, 1:30 PM  Clinical Narrative: Spoke with pt re PT recommendation for SNF. Pt aware of recommendation and reports agreeable, requesting [facility] where she has been in the past. Reviewed SNF placement process and answered questions. Will f/u with offers as available.   UPDATE 1600: Provided current SNF offers to pt and she has accepted [facility]. Confirmed bed with [facility] in [facility]. Auth request submitted with [employer] for [facility] and [facility]. Will provide updates as available.   Dellie Burns, MSW, LCSW [phone] (coverage)        Expected Discharge Plan: Home/Self Care Barriers to Discharge: Continued Medical Work up  Expected Discharge Plan and Services       Living arrangements for the past 2 months: Single Family Home                                       Social Determinants of Health (SDOH) Interventions SDOH Screenings   Food Insecurity: Food Insecurity Present (05/20/2023)  Housing: Low Risk  (05/20/2023)  Transportation Needs: No Transportation Needs (05/20/2023)  Recent Concern: Transportation Needs - Unmet Transportation Needs (04/08/2023)  Utilities: Not At Risk (05/20/2023)  Alcohol Screen: Low Risk  (04/08/2023)  Depression (PHQ2-9): High Risk (04/08/2023)  Financial Resource Strain: Low Risk  (04/08/2023)  Physical Activity: Inactive (04/08/2023)  Social Connections: Socially Isolated (05/20/2023)  Stress: Stress Concern Present (04/08/2023)  Tobacco Use: High Risk (05/20/2023)  Health Literacy: Adequate Health Literacy (04/08/2023)    Readmission Risk Interventions     No data to display

## 2023-05-21 NOTE — Progress Notes (Signed)
  Echocardiogram 2D Echocardiogram has been performed.  [name] [name] 05/21/2023, 10:48 AM

## 2023-05-21 NOTE — Hospital Course (Addendum)
#  Hypertensive Urgency  #Atypical Chest Pain #Weakness Initially presented with recurrent bouts of elevated blood pressure in the ED.  She was started on nitroglycerin drip but weaned off as her pressures stabilized.  She was transitioned to amlodipine. Amlodipine was switched to losartan for continued blood pressure and kidney support. EKG unremarkable. Echo was largely unchanged from prior. Evaluated by PT who recommended SNF for further improvement in her weakness. Unfortunately, given chronic progression of her presentation, the SNF authorization was denied. At discharge, will continue on losartan. Will also be followed by home health PT and OT.   #Hx of COPD Exacerbation  Noted recent increases in cigarettes with mild wheezing on examination. Received IV solumedrol in the ED. Of note, only had 1/3 of the cardinal symptoms for an exacerbation so held off on steroids.   #Atrial Fibrillation  #Hx of Mitral Valve Replacement Noted to have had a mitral valve replacement in 2012.  Continued on home warfarin managed per Dr.Groce while inpatient. Also resumed home metoprolol. Her INR was subtherapeutic.   #Type 2 DM Restarted on home 30 units long-acting with SSI. Sliding scale also decreased to sensitive correction coverage given episode of hypoglycemia. Glucose noted to have been sporadic in the 200s, but this appears to be her baseline. Follow-up outpatient.   #Chronic Pain  Pain contract signed with clinic. Continued on home regimen.   #Depression  Continued on home venlafaxine, Xanax, and wellbutrin   #Chronic Diastolic Heart Failure  She did have some mild swelling on examination with pain in her lower extremities but seems to have been unchanged from prior. Continued on home Lasix.

## 2023-05-21 NOTE — Inpatient Diabetes Management (Signed)
 Inpatient Diabetes Program Recommendations  AACE/ADA: New Consensus Statement on Inpatient Glycemic Control (2015)  Target Ranges:  Prepandial:   less than 140 mg/dL      Peak postprandial:   less than 180 mg/dL (1-2 hours)      Critically ill patients:  140 - 180 mg/dL   Lab Results  Component Value Date   GLUCAP 171 (H) 05/21/2023   HGBA1C 6.1 (A) 03/12/2023    Review of Glycemic Control  Latest Reference Range & Units 05/20/23 12:57 05/20/23 14:10 05/20/23 18:10 05/20/23 20:04 05/20/23 22:48 05/21/23 00:36 05/21/23 08:42  Glucose-Capillary 70 - 99 mg/dL 841 (H)   Novolog 15 units  Semglee 30 units 388 (H) 270 (H)  Novolog 8 units 129 (H) 62 (L) 156 (H) 171 (H)   Diabetes history: DM 2 Outpatient Diabetes medications: Tresiba 30 units Daily  Current orders for Inpatient glycemic control:  Novolog 0-15 units tid Semglee 30 units Daily  A1c 6.1% on 1/30  Inpatient Diabetes Program Recommendations:    Note: hypoglycemia after Novolog doses  -  consider reducing Novolog Correction to 0-9 units tid + hs scale  Thanks,  Christena Deem RN, MSN, BC-ADM Inpatient Diabetes Coordinator Team Pager [phone] (8a-5p)

## 2023-05-21 NOTE — Progress Notes (Addendum)
 PHARMACY - ANTICOAGULATION CONSULT NOTE  Pharmacy Consult for warfarin management during hospitalization.  Indication: atrial fibrillation  Allergies  Allergen Reactions   Dexilant [Dexlansoprazole] Other (See Comments)    Made acid reflux worse   Isovue [Iopamidol] Itching and Rash   Lorazepam Other (See Comments)    Patient's sister noted that ativan caused the patient to become extremely confused during hospitalization 09/2010; tolerates Xanax   Oxycontin [Oxycodone] Other (See Comments)    headache   Tramadol Hcl Swelling    Ankle swelling    Vital Signs: Temp: 98.4 F (36.9 C) (04/10 0841) BP: 150/89 (04/10 0841) Pulse Rate: 94 (04/10 0841)  Labs: Recent Labs    05/19/23 1317 05/20/23 0215 05/20/23 0507 05/21/23 0452 05/21/23 0853  HGB 8.3* 8.5*  --  8.3*  --   HCT 29.9* 31.1*  --  30.1*  --   PLT 235 242  --  245  --   LABPROT  --   --   --   --  17.1*  INR  --   --   --   --  1.4*  CREATININE 0.69 0.66  --  0.92  --   TROPONINIHS  --  15 16  --   --     Estimated Creatinine Clearance: 47.2 mL/min (by C-G formula based on SCr of 0.92 mg/dL).   Medical History: Past Medical History:  Diagnosis Date   Abnormality of lung on CXR 02/02/2020   Nonspecific finding on CXR ordered by pulmonologist - c/w inflammation vs infection, f/u imaging suggested, Dr. Roxy Cedar office has been in communication about recommended next steps.   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   Non-bleeding when visualized on capsule endoscopy 06/30/2015    Arthritis    "lower back; hands" (02/19/2018)   Asthma    Asymptomatic cholelithiasis 09/25/2015   Seen on CT scan 08/2015   Carotid artery stenosis; s/p R endarterectomy    s/p right endarterectomy (06/2010) Carotid US (07/2010):  Left: Moderate-to-severe (60-79%) calcific and non-calcific plaque origin and  proximal ICA and ECA    Chronic congestive heart failure with left ventricular diastolic dysfunction (HCC) 10/21/2010   Chronic constipation 02/03/2011   Chronic daily headache 01/16/2014   Chronic iron deficiency anemia    Chronic low back pain 10/06/2012   Chronic venous insufficiency 08/04/2012   Closed fracture of one rib of left side 08/23/2021   ED visit after a fall 08/16/21    COPD (chronic obstructive pulmonary disease) with emphysema (HCC)    PFTs 2018: severe obstructive disease, insignif response to bronchiodilator, mild restriction parenchymal pattern, moderately severe diffusion defect. 2014  FEV1 0.92 (40%), ratio 69, 27% increase in FEV1 with BD, TLC 91%, severe airtrapping, DLCO49% On chronic home O2. Pulmonary rehab referral 05/2012    Dyspnea    Fibromyalgia 08/29/2010   Gastroesophageal reflux disease    History of blood transfusion    "several times"  (02/19/2018)   History of clear cell renal cell carcinoma (HCC), in remission 07/21/2011   s/p cryoablation of left RCC in 09/2011 by Dr. Fredia Sorrow. Followed by Dr. Retta Diones  [facility]) .     History of fracture of left hip 10/17/2022   History of hiatal hernia    History of mitral valve replacement with bioprosthetic valve due to mitral stenosis 2012   s/p MVR with a 27-mm pericardial porcine valve (Medtronic Mosaic  valve, serial #[id number] on 09/20/10, Dr. Donata Clay)    History of obstructive sleep apnea, resolved 2013   resolved per sleep study 07/2019; no apnea, but did have desaturation.  CPAP no longer necessary.  Nocturnal polysomnography (06/2009): Moderate sleep apnea/ hypopnea syndrome , AHI 17.8 per hour with nonpositional hypopneas. CPAP titration to 12 CWP, AHI 2.4 per hour. On nocturnal CPAP via a small resMed Quattro full-face mask with heated humidifier.    History of pneumonia    "once"  (02/19/2018)   History of seborrheic keratosis 09/28/2015   History of stroke without residual deficits 01/17/2022    Hyperlipidemia LDL goal < 100 11/20/2005   Internal and external hemorrhoids without complication 08/04/2012   Lesion of left native kidney 06/01/2020   Incidental finding on recent screening chest CT for lung ca ordered by Dr. Maple Hudson pulmonology - he has ordered a f/u renal U/S.    Lichen sclerosus of female genitalia 01/12/2017   Migraine    "none in years" (02/19/2018)   Mild cognitive impairment with memory loss 12/23/2021   Moderately severe major depression (HCC) 11/19/2005   Nocturnal hypoxia per sleep study 07/2019    Osteoporosis    DEXA 2016: T -2.7; DEXA (12/09/2011): L-spine T -3.7, left hip T -1.4 DEXA (12/2004): L-spine T -2.6, left hip -0.1    Paroxysmal atrial fibrillation (HCC) 10/22/2010   s/p Left atrial maze procedure for paroxysmal atrial fibrillation on 09/20/2010 by Dr Donata Clay.  Subsequent splenic infarct, decision was made to re-anticoagulate with coumadin, likely life-long as this is the most likely cause of the splenic infarct.    Personal history of colonic polyps 05/14/2011   Colonoscopy (05/2011): 4 mm adenomatous polyp excised endoscopically Colonoscopy (02/2002): Adenomatous polyp excised endoscopically    Personal history of renal cell carcinoma 09/12/2020   Pneumonia    Pulmonary hypertension due to chronic obstructive pulmonary disease (HCC) 04/25/2016   2014 TEE w PA peak pressure 46 mmHg, s/p MV replacement    Right nephrolithiasis, asymptomatic, incidental finding 09/06/2014   5 mm non-obstructing calculus seen on CT scan 09/05/2014    Right ventricular failure (HCC) 04/25/2016   Severe obesity (BMI 35.0-39.9) with comorbidity (HCC) 10/23/2011   Sleep apnea    Stage 2 chronic kidney disease due to type 2 diabetes mellitus (HCC) 12/16/2018   CKD stage III     Tobacco abuse 07/28/2012   Type 2 diabetes mellitus with complication, with long-term current use of insulin (HCC)    Type 2 diabetes mellitus with diabetic neuropathy (HCC)    Weight loss due to  medication 03/12/2023    Medications:  Scheduled:   ALPRAZolam  0.5 mg Oral QHS   amLODipine  5 mg Oral Daily   buPROPion  150 mg Oral BH-q7a   furosemide  20 mg Oral Daily   gabapentin  600 mg Oral QHS   HYDROcodone-acetaminophen  2 tablet Oral BID   insulin aspart  0-15 Units Subcutaneous TID WC   insulin glargine-yfgn  30 Units Subcutaneous q morning   ipratropium-albuterol  3 mL Nebulization BID   lidocaine  1 patch Transdermal Q24H   losartan  50 mg Oral Daily   metoprolol succinate  25 mg Oral Daily   nicotine  14 mg Transdermal Daily   ramelteon  8 mg Oral QHS   venlafaxine XR  150 mg Oral Q breakfast   Warfarin - Pharmacist Dosing Inpatient   Does not apply q1600    Assessment: INR today is 1.4  Sluggish response to 2.5 mg warfarin dose provided yesterday. Will evaluate dose response (INR) daily and adjust dose accordingly.  Goal of Therapy:  INR target range goal is 2.0 - 2.5 as per her PCP, Dr. Raynelle Fanning A. Williams. The indication is for reducing the risk of stroke in the setting of non-valvular atrial fibrillation.    Plan:  Per inpatient dosing protocol, for INR < goal, provide 1.5 times the home dose.  2.5 mg warfarin x 1.5 = 3.75 mg, will round up, I.e. provide 4 mg warfarin today at 1600 hours. Will continue to evaluate dose response and observe for signs or symptoms of blood loss.Discussed with IMTS Team. Given her INR 1.4, will bridge with enoxaparin 1mg /kg SQ q12h. Patient may be discharged soon. She has a home INR monitor and performs patient self testing, finger-stick, point of care INR determinations and provides these to me, whereupon I adjust her dose and enter results and dose adjustment to EPIC/CHL as a phone note. Team will instruct patient to perform PST FS POC INR on 4/11 and report to me--as a part of her discharge instructions.  [name], PharmD, CPP Clinical Pharmacist Practitioner 05/21/2023,10:19 AM

## 2023-05-21 NOTE — NC FL2 (Signed)
 [place] MEDICAID FL2 LEVEL OF CARE FORM     IDENTIFICATION  Patient Name: [name] Birthdate: November 05, 1950 Sex: female Admission Date (Current Location): 05/20/2023  [facility] and [place] Number:  Producer, television/film/video and Address:  [facility], [address], [place], [place] [address]      Provider Number: [id number]  Attending Physician Name and Address:  Dickie La, MD  Relative Name and Phone Number:       Current Level of Care:   Recommended Level of Care:   Prior Approval Number:    Date Approved/Denied:   PASRR Number: [id number]  Discharge Plan: SNF    Current Diagnoses: Patient Active Problem List   Diagnosis Date Noted   Acute exacerbation of chronic obstructive pulmonary disease (COPD) (HCC) 05/20/2023   Hypertensive urgency 05/20/2023   Iron deficiency anemia 05/19/2023   IDA (iron deficiency anemia) 05/19/2023   Atrial fibrillation (HCC) 12/23/2022   History of atrial fibrillation 10/18/2022   Chronic anticoagulation 10/18/2022   History of fracture of left hip 10/17/2022   Generalized pain 08/01/2022   Multifactorial functional impairment 02/13/2022   History of stroke without residual deficits 01/17/2022   Diabetic peripheral neuropathy associated with type 2 diabetes mellitus (HCC) 11/11/2021   Recurrent falls 11/11/2021   Gait instability 09/19/2021   Perennial non-allergic rhinitis 09/19/2021   Chronic, continuous use of opioids for chronic pain 07/10/2021   Polypharmacy 11/08/2020   Personal history of renal cell carcinoma 09/12/2020   Tinea pedis of both feet 08/07/2020   Essential tremor 11/17/2019   Nocturnal hypoxia per sleep study 07/2019    Carotid artery stenosis; s/p R endarterectomy    COPD mixed type (HCC)    Arthritis of both hands 07/01/2019   Stage 2 chronic kidney disease due to type 2 diabetes mellitus (HCC) 12/16/2018   Lichen sclerosus of female genitalia 01/12/2017   Mixed stress and  urge urinary incontinence 05/27/2016   Pulmonary hypertension due to chronic obstructive pulmonary disease (HCC) 04/25/2016   Asymptomatic cholelithiasis 09/25/2015   Chronic iron deficiency anemia 12/26/2014   Aortic atherosclerosis (HCC) 10/19/2014   Right nephrolithiasis, asymptomatic (incidental finding) 09/06/2014   Headache, chronic intermittent 01/14/2013   Chronic low back pain 10/06/2012   Long term current use of anticoagulant therapy 09/30/2012   Recurrent candidal intertrigo 07/28/2012   Tobacco use disorder 07/28/2012   History of colonic polyps 05/14/2011   Sleep disturbance 05/14/2011   Abdominal wall hernia 05/14/2011   Constipation due to opioid therapy 02/03/2011   Health care maintenance 11/04/2010   PAF (paroxysmal atrial fibrillation) (HCC) 10/22/2010   Chronic diastolic heart failure (HCC) 10/21/2010   Fibromyalgia 08/29/2010   Gastroesophageal reflux disease    Type 2 diabetes mellitus with complication, with long-term current use of insulin (HCC)    Osteoporosis with current pathological fracture with routine healing    Peripheral arterial disease (HCC) 05/27/2010   History of mitral valve replacement with bioprosthetic valve 2012   Essential hypertension 06/19/2008   Hyperlipidemia 11/20/2005   Severe major depression (HCC) 11/19/2005    Orientation RESPIRATION BLADDER Height & Weight     Self, Time, Situation, Place  O2 Continent Weight: 141 lb (64 kg) Height:  5\' 2"  (157.5 cm)  BEHAVIORAL SYMPTOMS/MOOD NEUROLOGICAL BOWEL NUTRITION STATUS      Continent    AMBULATORY STATUS COMMUNICATION OF NEEDS Skin   Extensive Assist Verbally Normal  Personal Care Assistance Level of Assistance  Feeding, Dressing, Bathing Bathing Assistance: Limited assistance Feeding assistance: Limited assistance Dressing Assistance: Limited assistance     Functional Limitations Info  Sight, Hearing, Speech Sight Info: Adequate Hearing Info:  Adequate Speech Info: Adequate    SPECIAL CARE FACTORS FREQUENCY  PT (By licensed PT), OT (By licensed OT)                    Contractures Contractures Info: Not present    Additional Factors Info  Code Status Code Status Info: FULL CODE             Current Medications (05/21/2023):  This is the current hospital active medication list Current Facility-Administered Medications  Medication Dose Route Frequency Provider Last Rate Last Admin   acetaminophen (TYLENOL) tablet 1,000 mg  1,000 mg Oral Q6H PRN Nooruddin, Jason Fila, MD       albuterol (PROVENTIL) (2.5 MG/3ML) 0.083% nebulizer solution 2.5 mg  2.5 mg Nebulization Q4H PRN Dickie La, MD       ALPRAZolam Prudy Feeler) tablet 0.5 mg  0.5 mg Oral QHS Nooruddin, Saad, MD   0.5 mg at 05/20/23 2109   buPROPion (WELLBUTRIN XL) 24 hr tablet 150 mg  150 mg Oral Lesle Reek Nooruddin, Jason Fila, MD   150 mg at 05/21/23 0857   enoxaparin (LOVENOX) injection 60 mg  60 mg Subcutaneous Q12H Dickie La, MD   60 mg at 05/21/23 1201   furosemide (LASIX) tablet 20 mg  20 mg Oral Daily Nooruddin, Saad, MD   20 mg at 05/21/23 0857   gabapentin (NEURONTIN) capsule 600 mg  600 mg Oral QHS Dickie La, MD   600 mg at 05/20/23 2109   HYDROcodone-acetaminophen (NORCO/VICODIN) 5-325 MG per tablet 2 tablet  2 tablet Oral BID Nooruddin, Saad, MD   2 tablet at 05/21/23 0857   insulin aspart (novoLOG) injection 0-9 Units  0-9 Units Subcutaneous TID WC Morrie Sheldon, MD   5 Units at 05/21/23 1202   insulin glargine-yfgn (SEMGLEE) injection 30 Units  30 Units Subcutaneous q morning Dickie La, MD   30 Units at 05/21/23 0857   ipratropium-albuterol (DUONEB) 0.5-2.5 (3) MG/3ML nebulizer solution 3 mL  3 mL Nebulization BID Dickie La, MD   3 mL at 05/21/23 0836   lidocaine (LIDODERM) 5 % 1 patch  1 patch Transdermal Q24H Nooruddin, Saad, MD   1 patch at 05/20/23 1616   losartan (COZAAR) tablet 50 mg  50 mg Oral Daily Morrie Sheldon, MD   50 mg at 05/21/23 1201   metoprolol  succinate (TOPROL-XL) 24 hr tablet 25 mg  25 mg Oral Daily Morrie Sheldon, MD   25 mg at 05/21/23 1201   nicotine (NICODERM CQ - dosed in mg/24 hours) patch 14 mg  14 mg Transdermal Daily Nooruddin, Saad, MD   14 mg at 05/21/23 1202   ramelteon (ROZEREM) tablet 8 mg  8 mg Oral QHS Nooruddin, Saad, MD   8 mg at 05/20/23 2111   trimethobenzamide (TIGAN) injection 200 mg  200 mg Intramuscular Q6H PRN Nooruddin, Saad, MD   200 mg at 05/20/23 1049   venlafaxine XR (EFFEXOR-XR) 24 hr capsule 150 mg  150 mg Oral Q breakfast Nooruddin, Saad, MD   150 mg at 05/21/23 1610   warfarin (COUMADIN) tablet 4 mg  4 mg Oral ONCE-1600 Dickie La, MD       Warfarin - Pharmacist Dosing Inpatient   Does not apply [id number] Dickie La, MD  Discharge Medications: Please see discharge summary for a list of discharge medications.  Relevant Imaging Results:  Relevant Lab Results:   Additional Information ss#[id number]  Deatra Robinson, [place]

## 2023-05-21 NOTE — Progress Notes (Signed)
 Heart Failure Navigator Progress Note  Assessed for Heart & Vascular TOC clinic readiness.  Patient does not meet criteria due to Advanced Heart Failure Team patient of Dr. Shirlee Latch.   Navigator will sign off at this time.    Rhae Hammock, BSN, Scientist, clinical (histocompatibility and immunogenetics) Only

## 2023-05-21 NOTE — Progress Notes (Addendum)
 HD#1 SUBJECTIVE:  Patient Summary: [name] is a 73 y.o. with a pertinent PMH of COPD, atrial fibrillation on chronic anticoagulation, and type 2 diabetes who presented to the emergency department on 4/8 with concerns for shortness of breath and admitted for hypertensive urgency.   Overnight Events: NAEO  Interim History: Patient was evaluated at bedside. She was unable to clarify whether her shortness of breath has improved. She does endorse mild improvement in her breathing. She denies any chest pain, nausea, vomiting, or other symptoms.   OBJECTIVE:  Vital Signs: Vitals:   05/20/23 1900 05/21/23 0457 05/21/23 0836 05/21/23 0841  BP: (!) 104/53 (!) 168/60  (!) 150/89  Pulse: 78  77 94  Resp:  18 18 18   Temp: 98.7 F (37.1 C) 98.8 F (37.1 C)  98.4 F (36.9 C)  TempSrc: Oral     SpO2: 97% (!) 86% 98% 93%  Weight:      Height:       Supplemental O2: Nasal Cannula SpO2: 93 % O2 Flow Rate (L/min): 2 L/min  Filed Weights   05/20/23 0213  Weight: 64 kg    Intake/Output Summary (Last 24 hours) at 05/21/2023 1227 Last data filed at 05/21/2023 1217 Gross per 24 hour  Intake 100 ml  Output 150 ml  Net -50 ml   Net IO Since Admission: -1,528.38 mL [05/21/23 1227]  CBC    Component Value Date/Time   WBC 9.9 05/21/2023 0452   RBC 3.99 05/21/2023 0452   HGB 8.3 (L) 05/21/2023 0452   HGB 8.3 (L) 05/19/2023 1317   HGB 9.7 (L) 04/07/2023 1020   HGB 12.4 01/26/2012 0851   HCT 30.1 (L) 05/21/2023 0452   HCT 34.0 04/07/2023 1020   HCT 36.5 01/26/2012 0851   PLT 245 05/21/2023 0452   PLT 235 05/19/2023 1317   PLT 201 04/07/2023 1020   MCV 75.4 (L) 05/21/2023 0452   MCV 77 (L) 04/07/2023 1020   MCV 94.5 01/26/2012 0851   MCH 20.8 (L) 05/21/2023 0452   MCHC 27.6 (L) 05/21/2023 0452   RDW 18.8 (H) 05/21/2023 0452   RDW 16.8 (H) 04/07/2023 1020   RDW 16.0 (H) 01/26/2012 0851   LYMPHSABS 1.2 05/20/2023 0215   LYMPHSABS 2.1 09/01/2019 1022   LYMPHSABS 1.8  01/26/2012 0851   MONOABS 0.9 05/20/2023 0215   MONOABS 0.9 01/26/2012 0851   EOSABS 0.1 05/20/2023 0215   EOSABS 0.1 09/01/2019 1022   BASOSABS 0.1 05/20/2023 0215   BASOSABS 0.1 09/01/2019 1022   BASOSABS 0.0 01/26/2012 0851      Latest Ref Rng & Units 05/21/2023    4:52 AM 05/20/2023    2:15 AM 05/19/2023    1:17 PM  BMP  Glucose 70 - 99 mg/dL 161  096  045   BUN 8 - 23 mg/dL 22  12  14    Creatinine 0.44 - 1.00 mg/dL 4.09  8.11  9.14   Sodium 135 - 145 mmol/L 138  138  138   Potassium 3.5 - 5.1 mmol/L 3.6  3.9  4.1   Chloride 98 - 111 mmol/L 101  104  103   CO2 22 - 32 mmol/L 28  27  31    Calcium 8.9 - 10.3 mg/dL 9.1  9.0  9.0    Physical Exam: Physical Exam Constitutional:      Appearance: She is well-developed.  HENT:     Head: Normocephalic and atraumatic.  Cardiovascular:     Rate and Rhythm: Regular rhythm.  Tachycardia present.  Pulmonary:     Effort: Pulmonary effort is normal.     Breath sounds: Normal breath sounds.  Abdominal:     General: Bowel sounds are normal.     Palpations: Abdomen is soft.  Neurological:     Mental Status: She is alert.    ASSESSMENT/PLAN:  Assessment: Principal Problem:   Hypertensive urgency Active Problems:   Type 2 diabetes mellitus with complication, with long-term current use of insulin (HCC)   PAF (paroxysmal atrial fibrillation) (HCC)   Tobacco use disorder   Long term current use of anticoagulant therapy   Aortic atherosclerosis (HCC)   Nocturnal hypoxia per sleep study 07/2019   History of mitral valve replacement with bioprosthetic valve   History of stroke without residual deficits   Chronic diastolic heart failure (HCC)   Acute exacerbation of chronic obstructive pulmonary disease (COPD) (HCC)  Plan: #Hypertensive Urgency  Chest pain improved today.  She was titrated off nitro drip yesterday and started on amlodipine.  Repeat echo largely unchanged from previous assessments with ejection fraction of 60 to 65%.   Will resume on her home metoprolol.  Today, will discontinue amlodipine and exchange for ARB for continued renal support. - Stop amlodipine 5 mg daily - Start losartan 50 mg daily - Resume home metoprolol  #Hx of COPD  Endorses history of cough, but no acute changes.  At home, she is on 2.5 L of O2 at night.  She has intermittently been on room air and in 2 L throughout her stay.  Some mild wheezing appreciated on examination.  While working with PT, patient noted weakness and shortness of breath, so they are recommending SNF for further therapy - Follow-up social work regarding SNF placement - Wean oxygen as tolerated - Continue DuoNebs  #Atrial Fibrillation #Hx of Mitral Valve Replacement Noted to have had a mitral valve replacement in 2012.  Continued on home warfarin managed per Dr.Groce while inpatient. - Continue warfarin, bridge with Lovenox given subtherapeutic INR  #T2DM Glucose has been intermittently high and low, which is normal for her.  She will be continued on her home regimen of glargine 30 units.  Sliding scale also decreased to sensitive correction coverage given episode of hypoglycemia. - Continue glargine 30 units daily - Continue SSI  Chronic Conditions  #Depression: Continue home venlafaxine, Xanax, and Wellbutrin #Chronic Diastolic HF: Continue home lasix 20 mg daily   Best Practice: Diet: Regular diet IVF: Fluids: None, Rate: None VTE: SCDs Start: 05/20/23 4098 Code: Full AB: None Therapy Recs: SNF, DME: none DISPO: Anticipated discharge  TBD  to Skilled nursing facility pending  SNF availability .  Signature: Morrie Sheldon, MD Internal Medicine Resident, PGY-1 [facility]  Pager: [phone]  Please contact the on call pager after 5 pm and on weekends at [phone].

## 2023-05-21 NOTE — Evaluation (Signed)
 Physical Therapy Evaluation Patient Details Name: [name] MRN: [id number] DOB: 1950-07-28 Today's Date: 05/21/2023  History of Present Illness  Pt is 73 yo female who presents on 05/20/23 with SOB and chest pain. In ED 4/4 with same symptoms.  PMH: COPD, + smoker, DM2, HTN, chronic LBP, CHF  Clinical Impression  Pt admitted with above diagnosis. Pt from home alone, has had multiple falls at home and multiple readmissions. Her sister checks on her and brings her food but does not stay with her. On eval, pt becomes fatigued with all aspects of functional mobility. Each transition required increased time and then a rest to recover before moving on. She is not able to move at a pace that is functionally safe for being at home alone. In addition to this, she is currently needing mod A to stand from bed and BSC. She reports it has been increasingly difficult to get out of her chair at home. Patient will benefit from continued inpatient follow up therapy, <3 hours/day.   Pt currently with functional limitations due to the deficits listed below (see PT Problem List). Pt will benefit from acute skilled PT to increase their independence and safety with mobility to allow discharge.           If plan is discharge home, recommend the following: A little help with bathing/dressing/bathroom;A lot of help with walking and/or transfers;Assist for transportation;Help with stairs or ramp for entrance;Assistance with cooking/housework   Can travel by private vehicle   Yes    Equipment Recommendations None recommended by PT  Recommendations for Other Services  OT consult    Functional Status Assessment Patient has had a recent decline in their functional status and demonstrates the ability to make significant improvements in function in a reasonable and predictable amount of time.     Precautions / Restrictions Precautions Precautions: Fall Recall of Precautions/Restrictions:  Intact Precaution/Restrictions Comments: has had multiple falls Restrictions Weight Bearing Restrictions Per Provider Order: No      Mobility  Bed Mobility Overal bed mobility: Needs Assistance Bed Mobility: Supine to Sit, Sit to Supine     Supine to sit: Used rails, HOB elevated, Contact guard Sit to supine: Min assist, HOB elevated   General bed mobility comments: CGA with increased use of time and heavy use of rail and pt needed rest once up in sitting due to SOB. Pt needed min A to LE's for return to supine    Transfers Overall transfer level: Needs assistance Equipment used: Rolling walker (2 wheels) Transfers: Sit to/from Stand, Bed to chair/wheelchair/BSC Sit to Stand: Mod assist   Step pivot transfers: Min assist       General transfer comment: mod A for power up, min A to step to and from [facility] with RW    Ambulation/Gait         Gait velocity: decreased Gait velocity interpretation: <1.31 ft/sec, indicative of household ambulator   General Gait Details: did not progress due to increased WOB with all activity and pt reporting she does not feel well  Stairs            Wheelchair Mobility     Tilt Bed    Modified Rankin (Stroke Patients Only)       Balance Overall balance assessment: Needs assistance, History of Falls Sitting-balance support: No upper extremity supported, Feet supported Sitting balance-Leahy Scale: Fair     Standing balance support: Bilateral upper extremity supported, During functional activity Standing balance-Leahy Scale: Poor Standing balance  comment: can let go of RW with one hand for perineal care but not both hands                             Pertinent Vitals/Pain Pain Assessment Pain Assessment: No/denies pain    Home Living Family/patient expects to be discharged to:: Private residence Living Arrangements: Alone Available Help at Discharge: Family;Available PRN/intermittently Type of Home:  House Home Access: Stairs to enter Entrance Stairs-Rails: None Entrance Stairs-Number of Steps: 1   Home Layout: One level Home Equipment: Teacher, English as a foreign language (2 wheels);Cane - single point;Shower seat - built in;Grab bars - tub/shower;Grab bars - toilet;Hand held shower head Additional Comments: pt reports her sister checks on her and brings her food    Prior Function Prior Level of Function : History of Falls (last six months);Needs assist             Mobility Comments: has not had assistance but needs supervision as she has had multiple falls, with and without AD ADLs Comments: pt reports she has been unable to complete her ADL's recently due to SOB with all activity     Extremity/Trunk Assessment   Upper Extremity Assessment Upper Extremity Assessment: Defer to OT evaluation;Generalized weakness    Lower Extremity Assessment Lower Extremity Assessment: Generalized weakness    Cervical / Trunk Assessment Cervical / Trunk Assessment: Other exceptions Cervical / Trunk Exceptions: increased body habitus  Communication   Communication Communication: Impaired Factors Affecting Communication: Hearing impaired    Cognition Arousal: Alert Behavior During Therapy: WFL for tasks assessed/performed   PT - Cognitive impairments: No family/caregiver present to determine baseline                       PT - Cognition Comments: needs increased processing time, if given instructions quickly cannot follow through Following commands: Impaired Following commands impaired: Follows multi-step commands with increased time     Cueing Cueing Techniques: Verbal cues     General Comments General comments (skin integrity, edema, etc.): SPO2 91 % on RA, HR 96 bpm    Exercises     Assessment/Plan    PT Assessment Patient needs continued PT services  PT Problem List Cardiopulmonary status limiting activity;Decreased mobility;Decreased balance;Decreased activity  tolerance;Decreased strength       PT Treatment Interventions DME instruction;Gait training;Functional mobility training;Therapeutic activities;Therapeutic exercise;Balance training;Neuromuscular re-education;Patient/family education    PT Goals (Current goals can be found in the Care Plan section)  Acute Rehab PT Goals Patient Stated Goal: go to rehab PT Goal Formulation: With patient Time For Goal Achievement: 06/04/23 Potential to Achieve Goals: Good    Frequency Min 1X/week     Co-evaluation               AM-PAC PT "6 Clicks" Mobility  Outcome Measure Help needed turning from your back to your side while in a flat bed without using bedrails?: A Little Help needed moving from lying on your back to sitting on the side of a flat bed without using bedrails?: A Little Help needed moving to and from a bed to a chair (including a wheelchair)?: A Little Help needed standing up from a chair using your arms (e.g., wheelchair or bedside chair)?: A Lot Help needed to walk in hospital room?: Total Help needed climbing 3-5 steps with a railing? : Total 6 Click Score: 13    End of Session Equipment Utilized During Treatment: Gait belt Activity  Tolerance: Treatment limited secondary to medical complications (Comment) (SOB) Patient left: in bed;with call bell/phone within reach Nurse Communication: Mobility status PT Visit Diagnosis: Unsteadiness on feet (R26.81);History of falling (Z91.81);Repeated falls (R29.6);Muscle weakness (generalized) (M62.81);Difficulty in walking, not elsewhere classified (R26.2)    Time: 8413-2440 PT Time Calculation (min) (ACUTE ONLY): 46 min   Charges:   PT Evaluation $PT Eval Moderate Complexity: 1 Mod PT Treatments $Therapeutic Activity: 23-37 mins PT General Charges $$ ACUTE PT VISIT: 1 Visit         Lyanne Co, PT  Acute Rehab Services Secure chat preferred Office [phone]   Lawana Chambers Iysis Germain 05/21/2023, 1:06 PM

## 2023-05-22 LAB — GLUCOSE, CAPILLARY
Glucose-Capillary: 201 mg/dL — ABNORMAL HIGH (ref 70–99)
Glucose-Capillary: 214 mg/dL — ABNORMAL HIGH (ref 70–99)
Glucose-Capillary: 76 mg/dL (ref 70–99)
Glucose-Capillary: 173 mg/dL — ABNORMAL HIGH (ref 70–99)

## 2023-05-22 LAB — PROTIME-INR
INR: 1.8 — ABNORMAL HIGH (ref 0.8–1.2)
Prothrombin Time: 21.5 s — ABNORMAL HIGH (ref 11.4–15.2)

## 2023-05-22 LAB — CBC
HCT: 28.6 % — ABNORMAL LOW (ref 36.0–46.0)
Hemoglobin: 7.9 g/dL — ABNORMAL LOW (ref 12.0–15.0)
MCH: 20.7 pg — ABNORMAL LOW (ref 26.0–34.0)
MCHC: 27.6 g/dL — ABNORMAL LOW (ref 30.0–36.0)
MCV: 75.1 fL — ABNORMAL LOW (ref 80.0–100.0)
Platelets: 219 10*3/uL (ref 150–400)
RBC: 3.81 MIL/uL — ABNORMAL LOW (ref 3.87–5.11)
RDW: 18.9 % — ABNORMAL HIGH (ref 11.5–15.5)
WBC: 6.2 10*3/uL (ref 4.0–10.5)
nRBC: 0.3 % — ABNORMAL HIGH (ref 0.0–0.2)

## 2023-05-22 MED ORDER — WARFARIN SODIUM 4 MG PO TABS
4.0000 mg | ORAL_TABLET | Freq: Once | ORAL | Status: AC
  Administered 2023-05-22: 4 mg via ORAL
  Filled 2023-05-22: qty 1

## 2023-05-22 MED ORDER — MELATONIN 3 MG PO TABS
3.0000 mg | ORAL_TABLET | Freq: Every evening | ORAL | Status: DC | PRN
Start: 2023-05-22 — End: 2023-05-23
  Administered 2023-05-22: 3 mg via ORAL
  Filled 2023-05-22: qty 1

## 2023-05-22 MED ORDER — ENOXAPARIN SODIUM 60 MG/0.6ML IJ SOSY
60.0000 mg | PREFILLED_SYRINGE | Freq: Two times a day (BID) | INTRAMUSCULAR | Status: DC
  Administered 2023-05-22 – 2023-05-23 (×2): 60 mg via SUBCUTANEOUS
  Filled 2023-05-22 (×2): qty 0.6

## 2023-05-22 NOTE — Progress Notes (Addendum)
 HD#2 SUBJECTIVE:  Patient Summary: [name] is a 73 y.o. with a pertinent PMH of  COPD, atrial fibrillation on chronic anticoagulation, and type 2 diabetes who presented to the emergency department on 4/8 with concerns for shortness of breath and admitted for hypertensive urgency.   Overnight Events: NAEO   Interim History: Patient was evaluated at bedside. Denies any shortness of breath, difficulty breathing, or chest pain. She did endorse some lower extremity pain, edema resolved. She was unable to characterize the pain, but it appears to have been similar to prior.   OBJECTIVE:  Vital Signs: Vitals:   05/21/23 1845 05/21/23 1928 05/22/23 0351 05/22/23 0836  BP: (!) 128/98 (!) 146/74 (!) 131/52 (!) 155/50  Pulse: 73 78 66 76  Resp: 18 20 20 18   Temp: 98.4 F (36.9 C) 98.2 F (36.8 C) 97.6 F (36.4 C) 98.3 F (36.8 C)  TempSrc:  Oral Oral   SpO2: 93% 91% 95% 100%  Weight:      Height:       Supplemental O2: Nasal Cannula SpO2: 100 % O2 Flow Rate (L/min): 2 L/min  Filed Weights   05/20/23 0213  Weight: 64 kg   CBC    Component Value Date/Time   WBC 6.2 05/22/2023 0637   RBC 3.81 (L) 05/22/2023 0637   HGB 7.9 (L) 05/22/2023 0637   HGB 8.3 (L) 05/19/2023 1317   HGB 9.7 (L) 04/07/2023 1020   HGB 12.4 01/26/2012 0851   HCT 28.6 (L) 05/22/2023 0637   HCT 34.0 04/07/2023 1020   HCT 36.5 01/26/2012 0851   PLT 219 05/22/2023 0637   PLT 235 05/19/2023 1317   PLT 201 04/07/2023 1020   MCV 75.1 (L) 05/22/2023 0637   MCV 77 (L) 04/07/2023 1020   MCV 94.5 01/26/2012 0851   MCH 20.7 (L) 05/22/2023 0637   MCHC 27.6 (L) 05/22/2023 0637   RDW 18.9 (H) 05/22/2023 0637   RDW 16.8 (H) 04/07/2023 1020   RDW 16.0 (H) 01/26/2012 0851   LYMPHSABS 1.2 05/20/2023 0215   LYMPHSABS 2.1 09/01/2019 1022   LYMPHSABS 1.8 01/26/2012 0851   MONOABS 0.9 05/20/2023 0215   MONOABS 0.9 01/26/2012 0851   EOSABS 0.1 05/20/2023 0215   EOSABS 0.1 09/01/2019 1022   BASOSABS 0.1  05/20/2023 0215   BASOSABS 0.1 09/01/2019 1022   BASOSABS 0.0 01/26/2012 0851    Intake/Output Summary (Last 24 hours) at 05/22/2023 1410 Last data filed at 05/22/2023 0939 Gross per 24 hour  Intake 480 ml  Output 1825 ml  Net -1345 ml   Net IO Since Admission: -2,873.38 mL [05/22/23 1410]  Physical Exam: Constitutional:      Appearance: She is well-developed.  HENT:     Head: Normocephalic and atraumatic.  Cardiovascular:     Rate and Rhythm: Normal rate and regular rhythm. Mild LE edema to mid shin.  Pulmonary:     Effort: Pulmonary effort is normal.  Abdominal:     General: Bowel sounds are normal.     Palpations: Abdomen is soft.  Neurological:     General: No focal deficit present.     Mental Status: She is alert.  Psychiatric:        Mood and Affect: Mood normal.        Behavior: Behavior normal.   ASSESSMENT/PLAN:  Assessment: Principal Problem:   Hypertensive urgency Active Problems:   Type 2 diabetes mellitus with complication, with long-term current use of insulin (HCC)   PAF (paroxysmal atrial fibrillation) (  HCC)   Shortness of breath   Tobacco use disorder   Long term current use of anticoagulant therapy   Aortic atherosclerosis (HCC)   Nocturnal hypoxia per sleep study 07/2019   History of mitral valve replacement with bioprosthetic valve   History of stroke without residual deficits   Chronic diastolic heart failure (HCC)   Acute exacerbation of chronic obstructive pulmonary disease (COPD) (HCC)   Plan: #Hypertensive Urgency  Denies any chest pain this morning.  Improved with systolic primarily in the 130s to 140s.  Heart rate stable in the 70s to 80s.  Will continue on losartan and metoprolol.  Stable for discharge at this time pending SNF authorization. - Continue losartan 50 mg daily - Continue home metoprolol 25 mg daily  #Hx of COPD  Stable.  Denies any chest pain.  Saturating well around 95% on room air. - Continue DuoNebs - Wean oxygen as  tolerated  #Atrial Fibrillation #Hx of Mitral Valve Replacement in 2012 Current medication includes warfarin.  Managed by Dr. Alexandria Lodge while inpatient.  She has been subtherapeutic, but she is near her goal of 2-2.5. - Follow-up daily INR - Continue warfarin, bridging with Lovenox   #T2DM Glucose stable in the 200s.  Asymptomatic at this time.  Will continue on current regimen. - Trend glucose - Continue 30 units long-acting - Continue sensitive SSI  Chronic Conditions  #Depression: Continue home venlafaxine, Xanax, and Wellbutrin #Chronic Diastolic HF: Continue home lasix 20 mg daily   Best Practice: Diet: Regular diet IVF: Fluids: None, Rate: None VTE: SCDs Start: 05/20/23 1610 Code: Full AB: None Therapy Recs: SNF, DME: none DISPO: Anticipated discharge  TBD  to Skilled nursing facility pending SNF approval.  Signature: Morrie Sheldon, MD Internal Medicine Resident, PGY-1 [facility]  Pager: [phone]  Please contact the on call pager after 5 pm and on weekends at [phone].

## 2023-05-22 NOTE — TOC Progression Note (Addendum)
 Transition of Care [facility]) - Progression Note    Patient Details  Name: [name] MRN: [id number] Date of Birth: [birth year]  Transition [facility] CM/SW Contact  Dellie Burns [place], [place] Phone Number: 05/22/2023, 1:09 PM  Clinical Narrative:   Per MD, pt medically stable for dc to [facility]. Voicemail left for [employer] requesting update re auth status.   UPDATE 1320: Return call received from [name] with [employer] who reports auth request has been sent to medical director for review. MD aware of barrier to dc.   Dellie Burns, MSW, LCSW [phone] (coverage)      Expected Discharge Plan: Home/Self Care Barriers to Discharge: Continued Medical Work up  Expected Discharge Plan and Services       Living arrangements for the past 2 months: Single Family Home                                       Social Determinants of Health (SDOH) Interventions SDOH Screenings   Food Insecurity: Food Insecurity Present (05/20/2023)  Housing: Low Risk  (05/20/2023)  Transportation Needs: No Transportation Needs (05/20/2023)  Recent Concern: Transportation Needs - Unmet Transportation Needs (04/08/2023)  Utilities: Not At Risk (05/20/2023)  Alcohol Screen: Low Risk  (04/08/2023)  Depression (PHQ2-9): High Risk (04/08/2023)  Financial Resource Strain: Low Risk  (04/08/2023)  Physical Activity: Inactive (04/08/2023)  Social Connections: Socially Isolated (05/20/2023)  Stress: Stress Concern Present (04/08/2023)  Tobacco Use: High Risk (05/20/2023)  Health Literacy: Adequate Health Literacy (04/08/2023)    Readmission Risk Interventions     No data to display

## 2023-05-22 NOTE — Evaluation (Signed)
 Occupational Therapy Evaluation Patient Details Name: [name] MRN: [id number] DOB: 08-04-1950 Today's Date: 05/22/2023   History of Present Illness   Pt is 73 yo female who presents on 05/20/23 with SOB and chest pain. In ED 4/4 with same symptoms.  PMH: COPD, + smoker, DM2, HTN, chronic LBP, CHF     Clinical Impressions PTA, pt lives alone, typically ambulatory with Rollator vs no AD in the home and reports Modified Independence with ADLs. Pt with a hx of falls and has intermittent family assist for IADLs. Pt presents now with deficits in balance, endurance and strength. Pt requires CGA-Min A for bathroom mobility using RW, Setup for UB ADLs and Min A for LB ADLs d/t deficits. Recommend continued inpatient follow up therapy, <3 hours/day at DC with pt in agreement.      If plan is discharge home, recommend the following:   A little help with walking and/or transfers;A little help with bathing/dressing/bathroom;Assistance with cooking/housework;Assist for transportation     Functional Status Assessment   Patient has had a recent decline in their functional status and demonstrates the ability to make significant improvements in function in a reasonable and predictable amount of time.     Equipment Recommendations   None recommended by OT     Recommendations for Other Services         Precautions/Restrictions   Precautions Precautions: Fall Recall of Precautions/Restrictions: Intact Restrictions Weight Bearing Restrictions Per Provider Order: No     Mobility Bed Mobility Overal bed mobility: Needs Assistance Bed Mobility: Supine to Sit, Sit to Supine     Supine to sit: Supervision, HOB elevated, Used rails Sit to supine: Supervision        Transfers Overall transfer level: Needs assistance Equipment used: Rolling walker (2 wheels) Transfers: Sit to/from Stand Sit to Stand: Min assist           General transfer comment: steadying  assist, pt problem solving hand placement without cues, reports some limitations from prior R wrist fx using RW      Balance Overall balance assessment: Needs assistance, History of Falls Sitting-balance support: No upper extremity supported, Feet supported Sitting balance-Leahy Scale: Fair     Standing balance support: Bilateral upper extremity supported, During functional activity Standing balance-Leahy Scale: Poor                             ADL either performed or assessed with clinical judgement   ADL Overall ADL's : Needs assistance/impaired Eating/Feeding: Independent   Grooming: Contact guard assist;Standing   Upper Body Bathing: Set up;Sitting   Lower Body Bathing: Minimal assistance;Sit to/from stand;Sitting/lateral leans   Upper Body Dressing : Set up;Sitting   Lower Body Dressing: Minimal assistance;Sitting/lateral leans;Sit to/from stand   Toilet Transfer: Contact guard assist;Minimal assistance;Rolling walker (2 wheels) Toilet Transfer Details (indicate cue type and reason): Min A for stability with one knee buckling, CGA for other aspects of bathroom mobility with RW. Toileting- Clothing Manipulation and Hygiene: Minimal assistance;Sitting/lateral lean;Sit to/from stand       Functional mobility during ADLs: Minimal assistance;Contact guard assist;Rolling walker (2 wheels)       Vision Baseline Vision/History: 1 Wears glasses Ability to See in Adequate Light: 0 Adequate Patient Visual Report: No change from baseline Vision Assessment?: No apparent visual deficits     Perception         Praxis         Pertinent Vitals/Pain Pain  Assessment Pain Assessment: No/denies pain     Extremity/Trunk Assessment Upper Extremity Assessment Upper Extremity Assessment: Generalized weakness;Right hand dominant   Lower Extremity Assessment Lower Extremity Assessment: Defer to PT evaluation   Cervical / Trunk Assessment Cervical / Trunk  Assessment: Normal   Communication Communication Communication: No apparent difficulties   Cognition Arousal: Alert Behavior During Therapy: WFL for tasks assessed/performed Cognition: No apparent impairments                               Following commands: Intact       Cueing  General Comments   Cueing Techniques: Verbal cues      Exercises     Shoulder Instructions      Home Living Family/patient expects to be discharged to:: Private residence Living Arrangements: Alone Available Help at Discharge: Family;Available PRN/intermittently Type of Home: House Home Access: Stairs to enter Entrance Stairs-Number of Steps: 1 Entrance Stairs-Rails: None Home Layout: One level     Bathroom Shower/Tub: Producer, television/film/video: Standard Bathroom Accessibility: Yes   Home Equipment: Teacher, English as a foreign language (2 wheels);Cane - single point;Shower seat - built in;Grab bars - tub/shower;Grab bars - toilet;Hand held shower head          Prior Functioning/Environment Prior Level of Function : History of Falls (last six months);Needs assist             Mobility Comments: has not had assistance but needs supervision as she has had multiple falls, with and without AD; reports sometimes using Rollator in the home ADLs Comments: Reports Modified Independent with ADLs but SOB. Sister assists with meals, goes grocery shopping for pt    OT Problem List: Decreased strength;Decreased activity tolerance;Impaired balance (sitting and/or standing);Decreased knowledge of use of DME or AE   OT Treatment/Interventions: Self-care/ADL training;Therapeutic exercise;Energy conservation;DME and/or AE instruction;Therapeutic activities;Patient/family education;Balance training      OT Goals(Current goals can be found in the care plan section)   Acute Rehab OT Goals Patient Stated Goal: go to rehab OT Goal Formulation: With patient Time For Goal Achievement:  06/05/23 Potential to Achieve Goals: Good ADL Goals Pt Will Perform Lower Body Bathing: with modified independence;sit to/from stand;sitting/lateral leans Pt Will Transfer to Toilet: with modified independence;ambulating Pt/caregiver will Perform Home Exercise Program: Increased strength;Both right and left upper extremity;With theraband;Independently;With written HEP provided Additional ADL Goal #1: Pt to verbalize at least 3 fall prevention strategies to implement at home Additional ADL Goal #2: Pt to implement 3 energy conservation strategies during ADLs, IADLs and mobility   OT Frequency:  Min 2X/week    Co-evaluation              AM-PAC OT "6 Clicks" Daily Activity     Outcome Measure Help from another person eating meals?: None Help from another person taking care of personal grooming?: A Little Help from another person toileting, which includes using toliet, bedpan, or urinal?: A Little Help from another person bathing (including washing, rinsing, drying)?: A Little Help from another person to put on and taking off regular upper body clothing?: A Little Help from another person to put on and taking off regular lower body clothing?: A Little 6 Click Score: 19   End of Session Equipment Utilized During Treatment: Gait belt;Rolling walker (2 wheels)  Activity Tolerance: Patient tolerated treatment well Patient left: in bed;with call bell/phone within reach  OT Visit Diagnosis: Unsteadiness on feet (R26.81);Other abnormalities of gait  and mobility (R26.89);Muscle weakness (generalized) (M62.81)                Time: 4098-1191 OT Time Calculation (min): 25 min Charges:  OT General Charges $OT Visit: 1 Visit OT Evaluation $OT Eval Moderate Complexity: 1 Mod OT Treatments $Self Care/Home Management : 8-22 mins  Bradd Canary, OTR/L Acute Rehab Services Office: [phone]   Lorre Munroe 05/22/2023, 9:59 AM

## 2023-05-22 NOTE — Care Management Important Message (Addendum)
 Important Message  Patient Details  Name: [name] MRN: [id number] Date of Birth: [birth year]   Important Message Given:  Yes - Medicare IM  Patient left prior to IM delivery will mail a copy to the patient home address.    Deonte Otting 05/22/2023, 4:51 PM

## 2023-05-22 NOTE — Discharge Summary (Signed)
 Name: [name] MRN: [id number] DOB: 09-17-1950 73 y.o. PCP: Sherol Dixie, MD  Date of Admission: 05/20/2023  1:26 AM Date of Discharge:  05/23/23 Attending Physician: Dr.  Broadus Canes  DISCHARGE DIAGNOSIS:  Primary Problem: Hypertensive urgency   Hospital Problems: Principal Problem:   Hypertensive urgency Active Problems:   Type 2 diabetes mellitus with complication, with long-term current use of insulin (HCC)   PAF (paroxysmal atrial fibrillation) (HCC)   Shortness of breath   Tobacco use disorder   Long term current use of anticoagulant therapy   Aortic atherosclerosis (HCC)   Nocturnal hypoxia per sleep study 07/2019   History of mitral valve replacement with bioprosthetic valve   History of stroke without residual deficits   Chronic diastolic heart failure (HCC)   Acute exacerbation of chronic obstructive pulmonary disease (COPD) (HCC)    DISCHARGE MEDICATIONS:   Allergies as of 05/23/2023       Reactions   Dexilant [dexlansoprazole] Other (See Comments)   Made acid reflux worse   Isovue [iopamidol] Itching, Rash   Lorazepam Other (See Comments)   Patient's sister noted that ativan caused the patient to become extremely confused during hospitalization 09/2010; tolerates Xanax   Oxycontin [oxycodone] Other (See Comments)   headache   Tramadol Hcl Swelling   Ankle swelling        Medication List     TAKE these medications    albuterol 108 (90 Base) MCG/ACT inhaler Commonly known as: VENTOLIN HFA Inhale 2 puffs into the lungs every 6 hours as needed for shortness of breath   ALPRAZolam 0.5 MG tablet Commonly known as: Xanax Take 1 tablet (0.5 mg total) by mouth at bedtime. To help with sleep.   buPROPion 150 MG 24 hr tablet Commonly known as: Wellbutrin XL Take 1 tablet (150 mg total) by mouth every morning.   FreeStyle Libre 3 Sensor Misc Place 1 sensor on the skin every 14 days. Use to check glucose 6 times daily as directed    FreeStyle Libre 3 Plus Sensor Misc Change sensor every 15 days.   furosemide 40 MG tablet Commonly known as: LASIX Take 0.5 tablets (20 mg total) by mouth daily.   gabapentin 600 MG tablet Commonly known as: NEURONTIN Take 1 tablet (600 mg total) by mouth at bedtime.   HYDROcodone-acetaminophen 5-325 MG tablet Commonly known as: NORCO/VICODIN Take 2 tablets by mouth 2 (two) times daily.   ipratropium 0.03 % nasal spray Commonly known as: ATROVENT Instill 2 sprays into the nose 3 (three) times daily as directed   ipratropium-albuterol 0.5-2.5 (3) MG/3ML Soln Commonly known as: DUONEB Inhale 3 mLs by nebulization every 6 (six) hours as needed (shortness of breath, wheezing).   ketoconazole 2 % cream Commonly known as: NIZORAL Apply 1 application topically at bedtime as needed for irritation.   Linzess 72 MCG capsule Generic drug: linaclotide Take 1 capsule (72 mcg total) by mouth daily before breakfast.   losartan 50 MG tablet Commonly known as: COZAAR Take 1 tablet (50 mg total) by mouth daily.   metoprolol succinate 25 MG 24 hr tablet Commonly known as: Toprol XL Take 1 tablet (25 mg total) by mouth daily.   omeprazole 40 MG capsule Commonly known as: PRILOSEC Take 1 capsule (40 mg total) by mouth 2 (two) times daily.   ondansetron 4 MG tablet Commonly known as: Zofran Take 1 tablet (4 mg total) by mouth daily as needed for nausea or vomiting.   OXYGEN Inhale 2 L into the  lungs at bedtime.   potassium chloride 10 MEQ tablet Commonly known as: KLOR-CON M Take 1 tablet (10 mEq total) by mouth daily.   predniSONE 20 MG tablet Commonly known as: DELTASONE Take 2 tablets (40 mg total) by mouth daily.   ramelteon 8 MG tablet Commonly known as: ROZEREM Take 1 tablet (8 mg total) by mouth at bedtime.   senna-docusate 8.6-50 MG tablet Commonly known as: Senokot-S Take 1 tablet by mouth daily after supper.   TechLite Plus Pen Needles 32G X 4 MM Misc Generic  drug: Insulin Pen Needle Use to inject insulin daily   Trelegy Ellipta 100-62.5-25 MCG/ACT Aepb Generic drug: Fluticasone-Umeclidin-Vilant Inhale 1 puff into the lungs daily.   Tresiba FlexTouch 100 UNIT/ML FlexTouch Pen Generic drug: insulin degludec Inject 30 Units into the skin every morning.   venlafaxine XR 150 MG 24 hr capsule Commonly known as: EFFEXOR-XR Take 1 capsule (150 mg total) by mouth daily with breakfast.   warfarin 2.5 MG tablet Commonly known as: COUMADIN Take as directed. If you are unsure how to take this medication, talk to your nurse or doctor. Original instructions: Take 1 tablet (2.5 mg total) by mouth daily unless otherwise instructed       DISPOSITION AND FOLLOW-UP:  [name] was discharged from [facility] in Stable condition. At the hospital follow up visit please address:  Follow-up Recommendations: Consults: None Labs:  INR Studies: None Medications: Losartan  HOSPITAL COURSE:  Patient Summary: #Hypertensive Urgency  #Atypical Chest Pain #Weakness Initially presented with recurrent bouts of elevated blood pressure in the ED.  She was started on nitroglycerin drip but weaned off as her pressures stabilized.  She was transitioned to amlodipine. Amlodipine was switched to losartan for continued blood pressure and kidney support. EKG unremarkable. Echo was largely unchanged from prior. Evaluated by PT who recommended SNF for further improvement in her weakness. Unfortunately, given chronic progression of her presentation, the SNF authorization was denied. At discharge, will continue on losartan. Will also be followed by home health PT and OT.   #Hx of COPD Exacerbation  Noted recent increases in cigarettes with mild wheezing on examination. Received IV solumedrol in the ED. Of note, only had 1/3 of the cardinal symptoms for an exacerbation so held off on steroids.   #Atrial Fibrillation  #Hx of Mitral Valve  Replacement Noted to have had a mitral valve replacement in 2012.  Continued on home warfarin managed per Dr.Groce while inpatient. Also resumed home metoprolol. Her INR was subtherapeutic.   #Type 2 DM Restarted on home 30 units long-acting with SSI. Sliding scale also decreased to sensitive correction coverage given episode of hypoglycemia. Glucose noted to have been sporadic in the 200s, but this appears to be her baseline. Follow-up outpatient.   #Chronic Pain  Pain contract signed with clinic. Continued on home regimen.   #Depression  Continued on home venlafaxine, Xanax, and wellbutrin   #Chronic Diastolic Heart Failure  She did have some mild swelling on examination with pain in her lower extremities but seems to have been unchanged from prior. Continued on home Lasix.    DISCHARGE INSTRUCTIONS:   Discharge Instructions     Call MD for:  difficulty breathing, headache or visual disturbances   Complete by: As directed    Call MD for:  extreme fatigue   Complete by: As directed    Call MD for:  hives   Complete by: As directed    Call MD for:  persistant  dizziness or light-headedness   Complete by: As directed    Call MD for:  persistant nausea and vomiting   Complete by: As directed    Call MD for:  redness, tenderness, or signs of infection (pain, swelling, redness, odor or green/yellow discharge around incision site)   Complete by: As directed    Call MD for:  severe uncontrolled pain   Complete by: As directed    Call MD for:  temperature >100.4   Complete by: As directed    Diet - low sodium heart healthy   Complete by: As directed    Discharge instructions   Complete by: As directed    [name],  You were hospitalized for an elevated blood pressure. You were started on an IV medication and switched to an oral medication. At this time, we feel that you are safe to go home.  Please *START* taking the following medication:  - Losartan 50 mg daily   Please  continue taking your other medications as prescribed. I have also included a short duration of senna to help with your constipation.   You will also be seen by PT and OT at home to further improve your strength and balance. You will see Dr. Broadus Canes in a couple weeks. If you have any questions, please call the office at [phone]. We are glad that you are feeling better!   Increase activity slowly   Complete by: As directed        SUBJECTIVE:  Patient was evaluated at bedside. Denies any shortness of breath, difficulty breathing, or chest pain. She endorses constipation but has been passing gas. Her last stool was a few days prior to admission.   Discharge Vitals:   BP (!) 181/58 (BP Location: Left Arm)   Pulse 71   Temp (!) 97.3 F (36.3 C) (Oral)   Resp 18   Ht 5\' 2"  (1.575 m)   Wt 64 kg   SpO2 94%   BMI 25.79 kg/m   OBJECTIVE:  Physical Exam Constitutional:      Appearance: She is well-developed.  HENT:     Head: Normocephalic and atraumatic.  Cardiovascular:     Rate and Rhythm: Normal rate and regular rhythm.  Pulmonary:     Effort: Pulmonary effort is normal.     Comments: Mild wheezing Abdominal:     General: Bowel sounds are normal.     Palpations: Abdomen is soft.  Neurological:     General: No focal deficit present.     Mental Status: She is alert.  Psychiatric:        Mood and Affect: Mood normal.        Behavior: Behavior normal.     Pertinent Labs, Studies, and Procedures:     Latest Ref Rng & Units 05/23/2023    7:06 AM 05/22/2023    6:37 AM 05/21/2023    4:52 AM  CBC  WBC 4.0 - 10.5 K/uL 7.1  6.2  9.9   Hemoglobin 12.0 - 15.0 g/dL 8.0  7.9  8.3   Hematocrit 36.0 - 46.0 % 29.5  28.6  30.1   Platelets 150 - 400 K/uL 216  219  245        Latest Ref Rng & Units 05/21/2023    4:52 AM 05/20/2023    2:15 AM 05/19/2023    1:17 PM  CMP  Glucose 70 - 99 mg/dL 132  440  102   BUN 8 - 23 mg/dL 22  12  14   Creatinine 0.44 - 1.00 mg/dL 1.19  1.47  8.29    Sodium 135 - 145 mmol/L 138  138  138   Potassium 3.5 - 5.1 mmol/L 3.6  3.9  4.1   Chloride 98 - 111 mmol/L 101  104  103   CO2 22 - 32 mmol/L 28  27  31    Calcium 8.9 - 10.3 mg/dL 9.1  9.0  9.0   Total Protein 6.5 - 8.1 g/dL   6.2   Total Bilirubin 0.0 - 1.2 mg/dL   0.5   Alkaline Phos 38 - 126 U/L   50   AST 15 - 41 U/L   9   ALT 0 - 44 U/L   9     ECHOCARDIOGRAM COMPLETE Result Date: 05/21/2023    ECHOCARDIOGRAM REPORT   Patient Name:   Jamilee Lafosse Date of Exam: 05/21/2023 Medical Rec #:  [id number]              Height:       62.0 in Accession #:    [id number]             Weight:       141.0 lb Date of Birth:  Aug 05, 1950              BSA:          1.648 m Patient Age:    73 years               BP:           168/60 mmHg Patient Gender: F                      HR:           97 bpm. Exam Location:  Inpatient Procedure: 2D Echo, Cardiac Doppler and Color Doppler (Both Spectral and Color            Flow Doppler were utilized during procedure). Indications:    Chest Pain  History:        Patient has prior history of Echocardiogram examinations, most                 recent 10/18/2022. Arrythmias:PAC.                  Mitral Valve: bioprosthetic valve valve is present in the mitral                 position.  Sonographer:    Reta Cassis Referring Phys: SAAD NOORUDDIN IMPRESSIONS  1. Left ventricular ejection fraction, by estimation, is 60 to 65%. The left ventricle has normal function. The left ventricle has no regional wall motion abnormalities. There is mild concentric left ventricular hypertrophy. Left ventricular diastolic function could not be evaluated.  2. Right ventricular systolic function is moderately reduced. The right ventricular size is moderately enlarged. There is severely elevated pulmonary artery systolic pressure.  3. Left atrial size was severely dilated.  4. The mitral valve has been repaired/replaced. No evidence of mitral valve regurgitation. Moderate mitral stenosis. There is a  bioprosthetic valve present in the mitral position.  5. The aortic valve is calcified. Aortic valve regurgitation is moderate. Mild aortic valve stenosis. Aortic valve area, by VTI measures 1.71 cm. Aortic valve mean gradient measures 10.3 mmHg. Aortic valve Vmax measures 2.34 m/s.  6. The inferior vena cava is normal in size with greater than 50% respiratory variability, suggesting right atrial pressure of  3 mmHg. FINDINGS  Left Ventricle: Left ventricular ejection fraction, by estimation, is 60 to 65%. The left ventricle has normal function. The left ventricle has no regional wall motion abnormalities. The left ventricular internal cavity size was normal in size. There is  mild concentric left ventricular hypertrophy. Left ventricular diastolic function could not be evaluated due to mitral valve repair. Left ventricular diastolic function could not be evaluated. Right Ventricle: The right ventricular size is moderately enlarged. No increase in right ventricular wall thickness. Right ventricular systolic function is moderately reduced. There is severely elevated pulmonary artery systolic pressure. Left Atrium: Left atrial size was severely dilated. Right Atrium: Right atrial size was normal in size. Pericardium: There is no evidence of pericardial effusion. Presence of epicardial fat layer. Mitral Valve: The mitral valve has been repaired/replaced. No evidence of mitral valve regurgitation. There is a bioprosthetic valve present in the mitral position. Moderate mitral valve stenosis. MV peak gradient, 19.1 mmHg. The mean mitral valve gradient is 10.0 mmHg. Tricuspid Valve: The tricuspid valve is normal in structure. Tricuspid valve regurgitation is mild . No evidence of tricuspid stenosis. Aortic Valve: The aortic valve is calcified. Aortic valve regurgitation is moderate. Mild aortic stenosis is present. Aortic valve mean gradient measures 10.3 mmHg. Aortic valve peak gradient measures 21.8 mmHg. Aortic valve  area, by VTI measures 1.71 cm. Pulmonic Valve: The pulmonic valve was normal in structure. Pulmonic valve regurgitation is not visualized. No evidence of pulmonic stenosis. Aorta: The aortic root is normal in size and structure. Venous: The inferior vena cava is normal in size with greater than 50% respiratory variability, suggesting right atrial pressure of 3 mmHg. IAS/Shunts: No atrial level shunt detected by color flow Doppler.  LEFT VENTRICLE PLAX 2D LVIDd:         4.70 cm   Diastology LVIDs:         3.30 cm   LV e' medial:    8.16 cm/s LV PW:         1.30 cm   LV E/e' medial:  24.7 LV IVS:        1.20 cm   LV e' lateral:   11.20 cm/s LVOT diam:     2.00 cm   LV E/e' lateral: 18.0 LV SV:         69 LV SV Index:   42 LVOT Area:     3.14 cm  RIGHT VENTRICLE RV Basal diam:  3.30 cm RV S prime:     8.20 cm/s TAPSE (M-mode): 1.4 cm LEFT ATRIUM              Index        RIGHT ATRIUM           Index LA diam:        5.30 cm  3.22 cm/m   RA Area:     13.20 cm LA Vol (A2C):   131.0 ml 79.50 ml/m  RA Volume:   29.40 ml  17.84 ml/m LA Vol (A4C):   126.0 ml 76.47 ml/m LA Biplane Vol: 130.0 ml 78.89 ml/m  AORTIC VALVE AV Area (Vmax):    1.45 cm AV Area (Vmean):   1.65 cm AV Area (VTI):     1.71 cm AV Vmax:           233.67 cm/s AV Vmean:          142.333 cm/s AV VTI:            0.403 m AV Peak  Grad:      21.8 mmHg AV Mean Grad:      10.3 mmHg LVOT Vmax:         108.00 cm/s LVOT Vmean:        74.833 cm/s LVOT VTI:          0.219 m LVOT/AV VTI ratio: 0.54  AORTA Ao Root diam: 2.80 cm MITRAL VALVE MV Area (PHT): 3.84 cm     SHUNTS MV Area VTI:   1.80 cm     Systemic VTI:  0.22 m MV Peak grad:  19.1 mmHg    Systemic Diam: 2.00 cm MV Mean grad:  10.0 mmHg MV Vmax:       2.18 m/s MV Vmean:      152.7 cm/s MV Decel Time: 197 msec MV E velocity: 201.33 cm/s MV A velocity: 132.33 cm/s MV E/A ratio:  1.52 Kardie Tobb DO Electronically signed by Jerryl Morin DO Signature Date/Time: 05/21/2023/11:11:36 AM    Final    DG  Chest Port 1 View Result Date: 05/20/2023 CLINICAL DATA:  Shortness of breath EXAM: PORTABLE CHEST 1 VIEW COMPARISON:  Five days prior FINDINGS: Cardiomegaly. Prior median sternotomy for valve repair. Chronic interstitial coarsening without Kerley lines, likely bronchitic. Blunting of the lateral left costophrenic sulcus is stable. No consolidation or pneumothorax. Remote right rib fractures and right clavicular plating. IMPRESSION: No acute finding or change from prior. Cardiomegaly and emphysema. Electronically Signed   By: Ronnette Coke M.D.   On: 05/20/2023 05:41     Signed: Maxie Spaniel, MD Internal Medicine Resident, PGY-1 [facility]  Pager: [phone]

## 2023-05-22 NOTE — Progress Notes (Signed)
 PHARMACY - ANTICOAGULATION CONSULT NOTE  Pharmacy Consult for warfarin management during hospitalization.  Indication: atrial fibrillation  Allergies  Allergen Reactions   Dexilant [Dexlansoprazole] Other (See Comments)    Made acid reflux worse   Isovue [Iopamidol] Itching and Rash   Lorazepam Other (See Comments)    Patient's sister noted that ativan caused the patient to become extremely confused during hospitalization 09/2010; tolerates Xanax   Oxycontin [Oxycodone] Other (See Comments)    headache   Tramadol Hcl Swelling    Ankle swelling    Patient Measurements: Height: 5\' 2"  (157.5 cm) Weight: 64 kg (141 lb) IBW/kg (Calculated) : 50.1 HEPARIN DW (KG): 63  Vital Signs: Temp: 98.3 F (36.8 C) (04/11 0836) Temp Source: Oral (04/11 0351) BP: 155/50 (04/11 0836) Pulse Rate: 76 (04/11 0836)  Labs: Recent Labs    05/19/23 1317 05/19/23 1317 05/20/23 0215 05/20/23 0507 05/21/23 0452 05/21/23 0853 05/22/23 0637  HGB 8.3*   < > 8.5*  --  8.3*  --  7.9*  HCT 29.9*  --  31.1*  --  30.1*  --  28.6*  PLT 235  --  242  --  245  --  219  LABPROT  --   --   --   --   --  17.1* 21.5*  INR  --   --   --   --   --  1.4* 1.8*  CREATININE 0.69  --  0.66  --  0.92  --   --   TROPONINIHS  --   --  15 16  --   --   --    < > = values in this interval not displayed.    Estimated Creatinine Clearance: 47.2 mL/min (by C-G formula based on SCr of 0.92 mg/dL).   Medical History: Past Medical History:  Diagnosis Date   Abnormality of lung on CXR 02/02/2020   Nonspecific finding on CXR ordered by pulmonologist - c/w inflammation vs infection, f/u imaging suggested, Dr. Roxy Cedar office has been in communication about recommended next steps.   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   Non-bleeding when visualized on capsule endoscopy 06/30/2015     Arthritis    "lower back; hands" (02/19/2018)   Asthma    Asymptomatic cholelithiasis 09/25/2015   Seen on CT scan 08/2015   Carotid artery stenosis; s/p R endarterectomy    s/p right endarterectomy (06/2010) Carotid US (07/2010):  Left: Moderate-to-severe (60-79%) calcific and non-calcific plaque origin and proximal ICA and ECA    Chronic congestive heart failure with left ventricular diastolic dysfunction (HCC) 10/21/2010   Chronic constipation 02/03/2011   Chronic daily headache 01/16/2014   Chronic iron deficiency anemia    Chronic low back pain 10/06/2012   Chronic venous insufficiency 08/04/2012   Closed fracture of one rib of left side 08/23/2021   ED visit after a fall 08/16/21    COPD (chronic obstructive pulmonary disease) with emphysema (HCC)    PFTs 2018: severe obstructive disease, insignif response to bronchiodilator, mild restriction parenchymal pattern, moderately severe diffusion defect. 2014  FEV1 0.92 (40%), ratio 69, 27% increase in FEV1 with BD, TLC 91%, severe airtrapping, DLCO49% On chronic home O2. Pulmonary rehab referral 05/2012    Dyspnea    Fibromyalgia 08/29/2010   Gastroesophageal reflux disease    History of blood transfusion    "several times"  (02/19/2018)   History of  clear cell renal cell carcinoma (HCC), in remission 07/21/2011   s/p cryoablation of left RCC in 09/2011 by Dr. Fredia Sorrow. Followed by Dr. Retta Diones  [facility]) .     History of fracture of left hip 10/17/2022   History of hiatal hernia    History of mitral valve replacement with bioprosthetic valve due to mitral stenosis 2012   s/p MVR with a 27-mm pericardial porcine valve (Medtronic Mosaic valve, serial #[id number] on 09/20/10, Dr. Donata Clay)    History of obstructive sleep apnea, resolved 2013   resolved per sleep study 07/2019; no apnea, but did have desaturation.  CPAP no longer necessary.  Nocturnal polysomnography (06/2009): Moderate sleep apnea/ hypopnea syndrome , AHI 17.8 per hour  with nonpositional hypopneas. CPAP titration to 12 CWP, AHI 2.4 per hour. On nocturnal CPAP via a small resMed Quattro full-face mask with heated humidifier.    History of pneumonia    "once"  (02/19/2018)   History of seborrheic keratosis 09/28/2015   History of stroke without residual deficits 01/17/2022   Hyperlipidemia LDL goal < 100 11/20/2005   Internal and external hemorrhoids without complication 08/04/2012   Lesion of left native kidney 06/01/2020   Incidental finding on recent screening chest CT for lung ca ordered by Dr. Maple Hudson pulmonology - he has ordered a f/u renal U/S.    Lichen sclerosus of female genitalia 01/12/2017   Migraine    "none in years" (02/19/2018)   Mild cognitive impairment with memory loss 12/23/2021   Moderately severe major depression (HCC) 11/19/2005   Nocturnal hypoxia per sleep study 07/2019    Osteoporosis    DEXA 2016: T -2.7; DEXA (12/09/2011): L-spine T -3.7, left hip T -1.4 DEXA (12/2004): L-spine T -2.6, left hip -0.1    Paroxysmal atrial fibrillation (HCC) 10/22/2010   s/p Left atrial maze procedure for paroxysmal atrial fibrillation on 09/20/2010 by Dr Donata Clay.  Subsequent splenic infarct, decision was made to re-anticoagulate with coumadin, likely life-long as this is the most likely cause of the splenic infarct.    Personal history of colonic polyps 05/14/2011   Colonoscopy (05/2011): 4 mm adenomatous polyp excised endoscopically Colonoscopy (02/2002): Adenomatous polyp excised endoscopically    Personal history of renal cell carcinoma 09/12/2020   Pneumonia    Pulmonary hypertension due to chronic obstructive pulmonary disease (HCC) 04/25/2016   2014 TEE w PA peak pressure 46 mmHg, s/p MV replacement    Right nephrolithiasis, asymptomatic, incidental finding 09/06/2014   5 mm non-obstructing calculus seen on CT scan 09/05/2014    Right ventricular failure (HCC) 04/25/2016   Severe obesity (BMI 35.0-39.9) with comorbidity (HCC) 10/23/2011   Sleep  apnea    Stage 2 chronic kidney disease due to type 2 diabetes mellitus (HCC) 12/16/2018   CKD stage III     Tobacco abuse 07/28/2012   Type 2 diabetes mellitus with complication, with long-term current use of insulin (HCC)    Type 2 diabetes mellitus with diabetic neuropathy (HCC)    Weight loss due to medication 03/12/2023    Medications:  Scheduled:   ALPRAZolam  0.5 mg Oral QHS   buPROPion  150 mg Oral BH-q7a   enoxaparin (LOVENOX) injection  60 mg Subcutaneous Q12H   furosemide  20 mg Oral Daily   gabapentin  600 mg Oral QHS   HYDROcodone-acetaminophen  2 tablet Oral BID   insulin aspart  0-9 Units Subcutaneous TID WC   insulin glargine-yfgn  30 Units Subcutaneous q morning   ipratropium-albuterol  3  mL Nebulization BID   lidocaine  1 patch Transdermal Q24H   losartan  50 mg Oral Daily   metoprolol succinate  25 mg Oral Daily   nicotine  14 mg Transdermal Daily   ramelteon  8 mg Oral QHS   venlafaxine XR  150 mg Oral Q breakfast   Warfarin - Pharmacist Dosing Inpatient   Does not apply q1600    Assessment: INR today at 0637h is 1.8 (target INR range is 2.0 - 2.5 as per her PCP, Dr. Raynelle Fanning A. Williams.   Plan:  Warfarin 4 mg today at 1600h. Continue daily INRs. Continue with bridge enoxaparin ~1 mg/kg (60 mg) SQ every 12 hours until  such time that INR is greater than 2.0, then discontinue enoxaparin bridge.  If the patient were to be discharged over the weekend, she performs patient self testing, point of care, finger stick INRs in the home setting (if she returns home) using a CoagCheck device. Should she go to a SNF make certain to provide a suggested regimen out the door with an order for an INR to be performed on Monday 21-APR-25--IF she goes to a SNF.   [name], PharmD, CPP Clinical Pharmacist Practitioner 05/22/2023,9:16 AM

## 2023-05-23 ENCOUNTER — Encounter: Payer: Self-pay | Admitting: Hematology

## 2023-05-23 ENCOUNTER — Other Ambulatory Visit (HOSPITAL_COMMUNITY): Payer: Self-pay

## 2023-05-23 DIAGNOSIS — I161 Hypertensive emergency: Secondary | ICD-10-CM

## 2023-05-23 LAB — CBC
HCT: 29.5 % — ABNORMAL LOW (ref 36.0–46.0)
Hemoglobin: 8 g/dL — ABNORMAL LOW (ref 12.0–15.0)
MCH: 20.6 pg — ABNORMAL LOW (ref 26.0–34.0)
MCHC: 27.1 g/dL — ABNORMAL LOW (ref 30.0–36.0)
MCV: 76 fL — ABNORMAL LOW (ref 80.0–100.0)
Platelets: 216 10*3/uL (ref 150–400)
RBC: 3.88 MIL/uL (ref 3.87–5.11)
RDW: 18.8 % — ABNORMAL HIGH (ref 11.5–15.5)
WBC: 7.1 10*3/uL (ref 4.0–10.5)
nRBC: 0.3 % — ABNORMAL HIGH (ref 0.0–0.2)

## 2023-05-23 LAB — PROTIME-INR
INR: 2.1 — ABNORMAL HIGH (ref 0.8–1.2)
Prothrombin Time: 23.3 s — ABNORMAL HIGH (ref 11.4–15.2)

## 2023-05-23 LAB — GLUCOSE, CAPILLARY
Glucose-Capillary: 129 mg/dL — ABNORMAL HIGH (ref 70–99)
Glucose-Capillary: 113 mg/dL — ABNORMAL HIGH (ref 70–99)
Glucose-Capillary: 155 mg/dL — ABNORMAL HIGH (ref 70–99)

## 2023-05-23 MED ORDER — LOSARTAN POTASSIUM 50 MG PO TABS
50.0000 mg | ORAL_TABLET | Freq: Every day | ORAL | 0 refills | Status: DC
  Filled 2023-05-23: qty 30, 30d supply, fill #0

## 2023-05-23 MED ORDER — SENNOSIDES-DOCUSATE SODIUM 8.6-50 MG PO TABS: 1.0000 | ORAL_TABLET | Freq: Every day | ORAL | Status: DC

## 2023-05-23 MED ORDER — POLYETHYLENE GLYCOL 3350 17 G PO PACK
17.0000 g | PACK | Freq: Every day | ORAL | Status: DC
Start: 2023-05-23 — End: 2023-05-23
  Administered 2023-05-23: 17 g via ORAL
  Filled 2023-05-23: qty 1

## 2023-05-23 MED ORDER — SENNOSIDES-DOCUSATE SODIUM 8.6-50 MG PO TABS
1.0000 | ORAL_TABLET | Freq: Every day | ORAL | 0 refills | Status: DC
  Filled 2023-05-23: qty 5, 5d supply, fill #0

## 2023-05-23 MED ORDER — WARFARIN SODIUM 2.5 MG PO TABS
2.5000 mg | ORAL_TABLET | Freq: Once | ORAL | Status: DC
  Filled 2023-05-23: qty 1

## 2023-05-23 NOTE — Progress Notes (Addendum)
 PHARMACY - ANTICOAGULATION CONSULT NOTE  Pharmacy Consult for warfarin management during hospitalization.  Indication: atrial fibrillation  Allergies  Allergen Reactions   Dexilant [Dexlansoprazole] Other (See Comments)    Made acid reflux worse   Isovue [Iopamidol] Itching and Rash   Lorazepam Other (See Comments)    Patient's sister noted that ativan caused the patient to become extremely confused during hospitalization 09/2010; tolerates Xanax   Oxycontin [Oxycodone] Other (See Comments)    headache   Tramadol Hcl Swelling    Ankle swelling    Patient Measurements: Height: 5\' 2"  (157.5 cm) Weight: 64 kg (141 lb) IBW/kg (Calculated) : 50.1 HEPARIN DW (KG): 63  Vital Signs: Temp: 97.3 F (36.3 C) (04/12 0849) Temp Source: Oral (04/12 0849) BP: 181/58 (04/12 0849) Pulse Rate: 71 (04/12 0849)  Labs: Recent Labs    05/21/23 0452 05/21/23 0853 05/22/23 0637 05/23/23 0706  HGB 8.3*  --  7.9* 8.0*  HCT 30.1*  --  28.6* 29.5*  PLT 245  --  219 216  LABPROT  --  17.1* 21.5* 23.3*  INR  --  1.4* 1.8* 2.1*  CREATININE 0.92  --   --   --     Estimated Creatinine Clearance: 47.2 mL/min (by C-G formula based on SCr of 0.92 mg/dL).   Medical History: Past Medical History:  Diagnosis Date   Abnormality of lung on CXR 02/02/2020   Nonspecific finding on CXR ordered by pulmonologist - c/w inflammation vs infection, f/u imaging suggested, Dr. Antonette Batters office has been in communication about recommended next steps.   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   Non-bleeding when visualized on capsule endoscopy 06/30/2015    Arthritis    "lower back; hands" (02/19/2018)   Asthma    Asymptomatic cholelithiasis 09/25/2015   Seen on CT scan 08/2015   Carotid artery stenosis; s/p R endarterectomy    s/p right endarterectomy (06/2010) Carotid US  (07/2010):   Left: Moderate-to-severe (60-79%) calcific and non-calcific plaque origin and proximal ICA and ECA    Chronic congestive heart failure with left ventricular diastolic dysfunction (HCC) 10/21/2010   Chronic constipation 02/03/2011   Chronic daily headache 01/16/2014   Chronic iron deficiency anemia    Chronic low back pain 10/06/2012   Chronic venous insufficiency 08/04/2012   Closed fracture of one rib of left side 08/23/2021   ED visit after a fall 08/16/21    COPD (chronic obstructive pulmonary disease) with emphysema (HCC)    PFTs 2018: severe obstructive disease, insignif response to bronchiodilator, mild restriction parenchymal pattern, moderately severe diffusion defect. 2014  FEV1 0.92 (40%), ratio 69, 27% increase in FEV1 with BD, TLC 91%, severe airtrapping, DLCO49% On chronic home O2. Pulmonary rehab referral 05/2012    Dyspnea    Fibromyalgia 08/29/2010   Gastroesophageal reflux disease    History of blood transfusion    "several times"  (02/19/2018)   History of clear cell renal cell carcinoma (HCC), in remission 07/21/2011   s/p cryoablation of left RCC in 09/2011 by Dr. Nereida Banning. Followed by Dr. Joie Narrow  [facility]) .     History of fracture of left hip 10/17/2022   History of hiatal hernia    History of mitral valve replacement with bioprosthetic valve due to mitral stenosis 2012   s/p MVR with a 27-mm pericardial porcine valve (Medtronic Mosaic valve, serial #[id number] on 09/20/10, Dr. Matt Song)  History of obstructive sleep apnea, resolved 2013   resolved per sleep study 07/2019; no apnea, but did have desaturation.  CPAP no longer necessary.  Nocturnal polysomnography (06/2009): Moderate sleep apnea/ hypopnea syndrome , AHI 17.8 per hour with nonpositional hypopneas. CPAP titration to 12 CWP, AHI 2.4 per hour. On nocturnal CPAP via a small resMed Quattro full-face mask with heated humidifier.    History of pneumonia    "once"  (02/19/2018)   History of seborrheic  keratosis 09/28/2015   History of stroke without residual deficits 01/17/2022   Hyperlipidemia LDL goal < 100 11/20/2005   Internal and external hemorrhoids without complication 08/04/2012   Lesion of left native kidney 06/01/2020   Incidental finding on recent screening chest CT for lung ca ordered by Dr. Linder Revere pulmonology - he has ordered a f/u renal U/S.    Lichen sclerosus of female genitalia 01/12/2017   Migraine    "none in years" (02/19/2018)   Mild cognitive impairment with memory loss 12/23/2021   Moderately severe major depression (HCC) 11/19/2005   Nocturnal hypoxia per sleep study 07/2019    Osteoporosis    DEXA 2016: T -2.7; DEXA (12/09/2011): L-spine T -3.7, left hip T -1.4 DEXA (12/2004): L-spine T -2.6, left hip -0.1    Paroxysmal atrial fibrillation (HCC) 10/22/2010   s/p Left atrial maze procedure for paroxysmal atrial fibrillation on 09/20/2010 by Dr Matt Song.  Subsequent splenic infarct, decision was made to re-anticoagulate with coumadin, likely life-long as this is the most likely cause of the splenic infarct.    Personal history of colonic polyps 05/14/2011   Colonoscopy (05/2011): 4 mm adenomatous polyp excised endoscopically Colonoscopy (02/2002): Adenomatous polyp excised endoscopically    Personal history of renal cell carcinoma 09/12/2020   Pneumonia    Pulmonary hypertension due to chronic obstructive pulmonary disease (HCC) 04/25/2016   2014 TEE w PA peak pressure 46 mmHg, s/p MV replacement    Right nephrolithiasis, asymptomatic, incidental finding 09/06/2014   5 mm non-obstructing calculus seen on CT scan 09/05/2014    Right ventricular failure (HCC) 04/25/2016   Severe obesity (BMI 35.0-39.9) with comorbidity (HCC) 10/23/2011   Sleep apnea    Stage 2 chronic kidney disease due to type 2 diabetes mellitus (HCC) 12/16/2018   CKD stage III     Tobacco abuse 07/28/2012   Type 2 diabetes mellitus with complication, with long-term current use of insulin (HCC)     Type 2 diabetes mellitus with diabetic neuropathy (HCC)    Weight loss due to medication 03/12/2023    Medications:  Scheduled:   ALPRAZolam  0.5 mg Oral QHS   buPROPion  150 mg Oral BH-q7a   enoxaparin (LOVENOX) injection  60 mg Subcutaneous Q12H   furosemide  20 mg Oral Daily   gabapentin  600 mg Oral QHS   HYDROcodone-acetaminophen  2 tablet Oral BID   insulin aspart  0-9 Units Subcutaneous TID WC   insulin glargine-yfgn  30 Units Subcutaneous q morning   lidocaine  1 patch Transdermal Q24H   losartan  50 mg Oral Daily   metoprolol succinate  25 mg Oral Daily   nicotine  14 mg Transdermal Daily   polyethylene glycol  17 g Oral Daily   ramelteon  8 mg Oral QHS   senna-docusate  1 tablet Oral QPC supper   venlafaxine XR  150 mg Oral Q breakfast   warfarin  2.5 mg Oral ONCE-1600   Warfarin - Pharmacist Dosing Inpatient   Does not apply q1600  Assessment: Pharmacy is consulted to dose Warfarin for the indication of atrial fibrillation. Patient's target INR range is 2.0 - 2.5 as per her PCP, Dr. Concha Deed A. Williams. Morning INR 2.1, which is therapeutic. Given indication of atrial fibrillation, rather than DVT or mechanical valve replacement, will d/c therapeutic enoxaparin today with 1 therapeutic INR.   Home Dose: Warfarin 1.25 mg on Fridays, 2.5 mg all other days (16.25/wk)   Plan:  Warfarin 2.5 mg today at 1600h.  Stop Lovenox Continue daily INRs.   If the patient were to be discharged over the weekend, she performs patient self testing, point of care, finger stick INRs in the home setting (if she returns home) using a CoagCheck device. Should she go to a SNF make certain to provide a suggested regimen out the door with an order for an INR to be performed on Monday 21-APR-25--IF she goes to a SNF.   Estela Held, PharmD PGY-2 Infectious Diseases Pharmacy Resident [facility] 05/23/2023 9:07 AM

## 2023-05-23 NOTE — Progress Notes (Signed)
 DISCHARGE NOTE HOME [name] to be discharged Home per MD order. Discussed prescriptions and follow up appointments with the patient. Prescriptions given to patient; medication list explained in detail. Patient verbalized understanding.  Skin clean, dry and intact without evidence of skin break down, no evidence of skin tears noted. IV catheter discontinued intact. Site without signs and symptoms of complications. Dressing and pressure applied. Pt denies pain at the site currently. No complaints noted.  Patient free of lines, drains, and wounds.   An After Visit Summary (AVS) was printed and given to the patient. Patient escorted via wheelchair, and discharged home via private auto.  Tonda Francisco, RN

## 2023-05-23 NOTE — TOC Transition Note (Signed)
 Transition of Care [facility]) - Discharge Note   Patient Details  Name: [name] MRN: [id number] Date of Birth: 02-26-1950  Transition of Care [facility]) CM/SW Contact:  Jannine Meo, RN Phone Number: 05/23/2023, 11:23 AM   Clinical Narrative:   Secure message from provider received to assist with setting up [facility] PT/OT. HH PT/OT arranged through [facility] with Gasper Karst. Contact information placed on AVS.    Final next level of care: Home w Home Health Services Barriers to Discharge: No Barriers Identified   Patient Goals and CMS Choice Patient states their goals for this hospitalization and ongoing recovery are:: to return home          Discharge Placement                       Discharge Plan and Services Additional resources added to the After Visit Summary for                            [facility] Arranged: PT, OT HH Agency: [facility] Date [facility] Agency Contacted: 05/23/23 Time HH Agency Contacted: [phone] Representative spoke with at [facility] Agency: Randel Buss  Social Drivers of Health (SDOH) Interventions SDOH Screenings   Food Insecurity: Food Insecurity Present (05/20/2023)  Housing: Low Risk  (05/20/2023)  Transportation Needs: No Transportation Needs (05/20/2023)  Recent Concern: Transportation Needs - Unmet Transportation Needs (04/08/2023)  Utilities: Not At Risk (05/20/2023)  Alcohol Screen: Low Risk  (04/08/2023)  Depression (PHQ2-9): High Risk (04/08/2023)  Financial Resource Strain: Low Risk  (04/08/2023)  Physical Activity: Inactive (04/08/2023)  Social Connections: Socially Isolated (05/20/2023)  Stress: Stress Concern Present (04/08/2023)  Tobacco Use: High Risk (05/20/2023)  Health Literacy: Adequate Health Literacy (04/08/2023)     Readmission Risk Interventions     No data to display

## 2023-05-23 NOTE — TOC Progression Note (Addendum)
 Transition of Care [facility]) - Progression Note    Patient Details  Name: [name] MRN: [id number] Date of Birth: [birth year]  Transition of Care [facility]) CM/SW Contact  Siah Kannan [place], [place] Phone Number: 05/23/2023, 9:32 AM  Clinical Narrative:   Call received from [name] with [employer] offering a peer-to-peer review for [facility] SNF by 3pm today. Contact information for P2P provided to MD. [name] received for [facility], reference [id number]. SW will assist as indicated.   UPDATE 1055: Per MD, peer-to-peer completed and pt has been denied for SNF. Discussed option for appeal with pt and she has declined, plans to return home with [facility]. CM made aware of HH and potential DME needs.  Paullette Boston, MSW, LCSW [phone] (coverage)      Expected Discharge Plan: Home/Self Care Barriers to Discharge: Continued Medical Work up  Expected Discharge Plan and Services       Living arrangements for the past 2 months: Single Family Home                                       Social Determinants of Health (SDOH) Interventions SDOH Screenings   Food Insecurity: Food Insecurity Present (05/20/2023)  Housing: Low Risk  (05/20/2023)  Transportation Needs: No Transportation Needs (05/20/2023)  Recent Concern: Transportation Needs - Unmet Transportation Needs (04/08/2023)  Utilities: Not At Risk (05/20/2023)  Alcohol Screen: Low Risk  (04/08/2023)  Depression (PHQ2-9): High Risk (04/08/2023)  Financial Resource Strain: Low Risk  (04/08/2023)  Physical Activity: Inactive (04/08/2023)  Social Connections: Socially Isolated (05/20/2023)  Stress: Stress Concern Present (04/08/2023)  Tobacco Use: High Risk (05/20/2023)  Health Literacy: Adequate Health Literacy (04/08/2023)    Readmission Risk Interventions     No data to display

## 2023-05-24 IMAGING — DX DG ABDOMEN 1V
1 series · 1 of 1 positions shown · non-contrast
Comparison: CT from 12/18/2020

CLINICAL DATA: Abdominal pain with diarrhea for several months,
initial encounter

EXAM:
ABDOMEN - 1 VIEW

[dg abd 1 view]
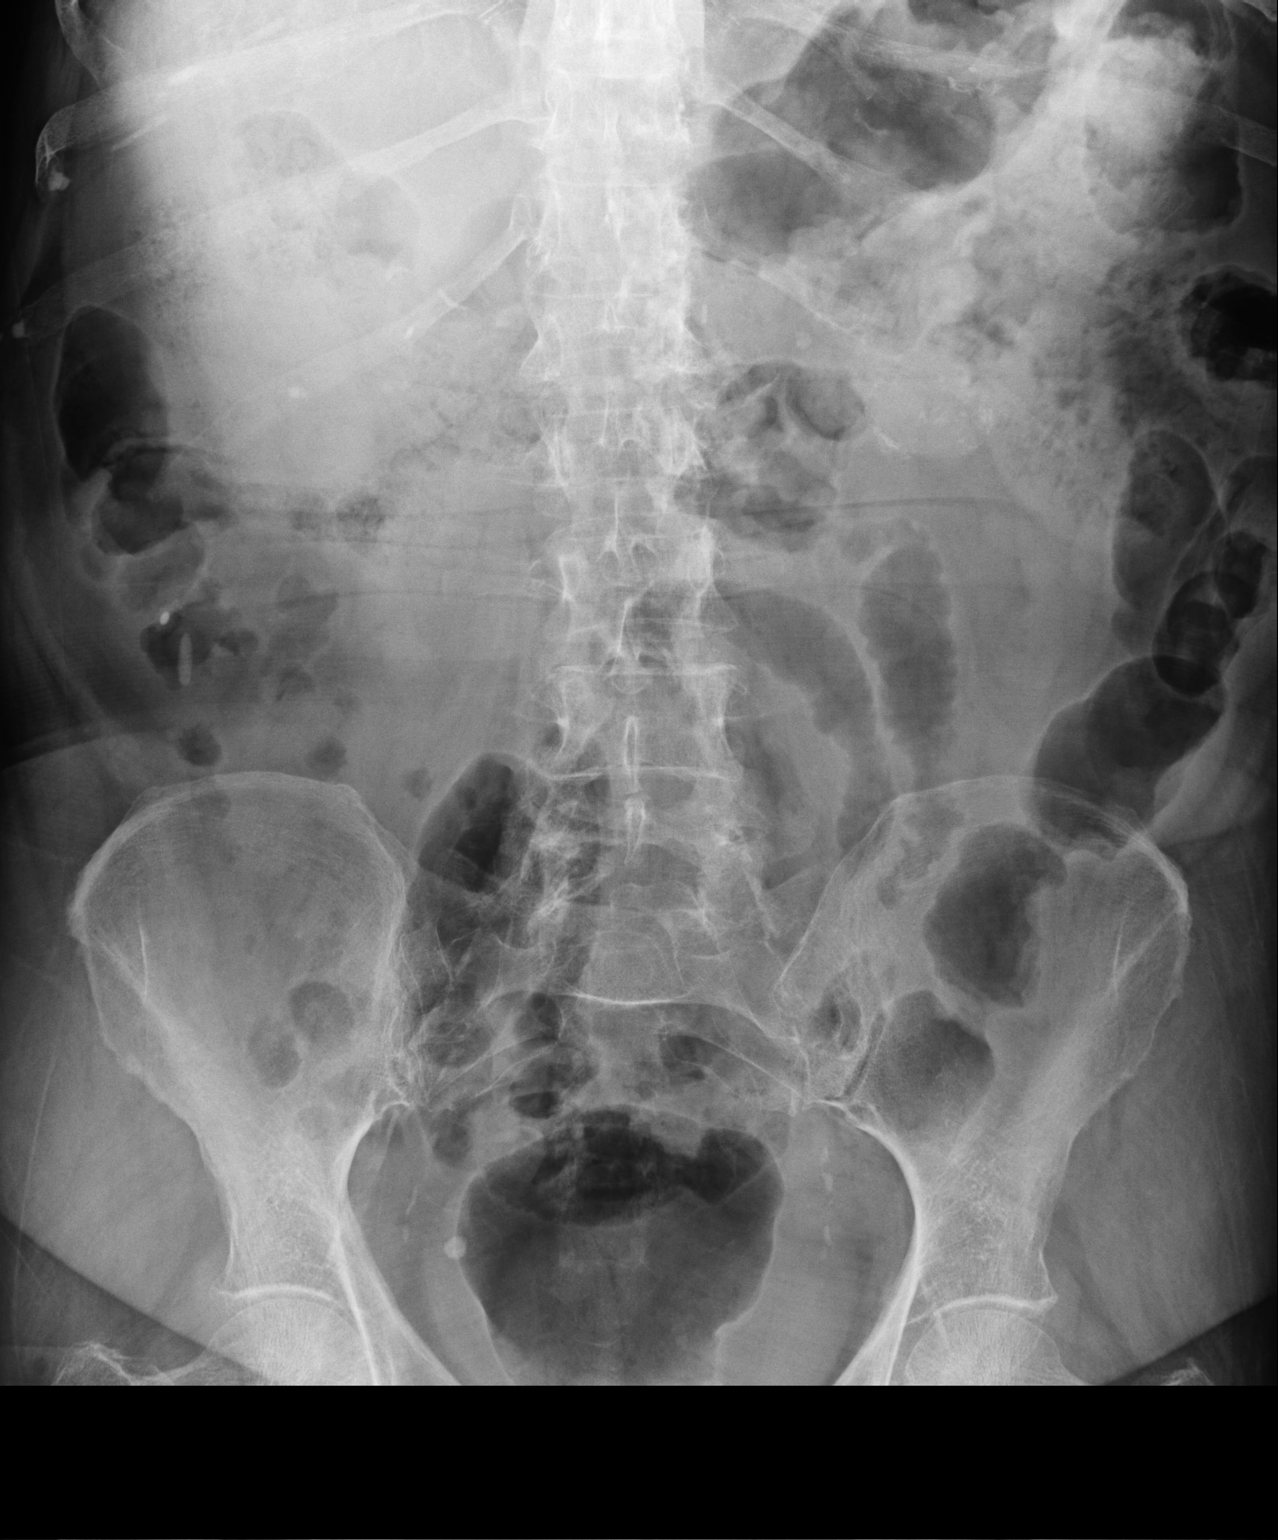

[1 of 1 positions shown; findings below may reference images not displayed]

FINDINGS: Scattered large and small bowel gas is noted. No obstructive changes
are seen. Mild retained fecal material is noted. No free air is
noted. Degenerative changes of the lumbar spine are noted.
IMPRESSION: No acute abnormality noted.

## 2023-05-25 ENCOUNTER — Telehealth: Payer: Self-pay | Admitting: Pharmacist

## 2023-05-25 ENCOUNTER — Telehealth: Payer: Self-pay | Admitting: *Deleted

## 2023-05-25 NOTE — Telephone Encounter (Signed)
 Pt texts results of PST FS POC INR = 1.7 on 17.5 mg warfarin/wk (2.5 mg strength tablet every day.) No bleeding, no new medication, no missed doses she states. INCREASED to 18.75 mg warfarin/wk as: 3.75mg  today; 2.5mg  all other days. Repeat INR 21APR25.

## 2023-05-25 NOTE — Transitions of Care (Post Inpatient/ED Visit) (Signed)
   05/25/2023  Name: [name] MRN: [id number] DOB: 10-20-1950  Today's TOC FU Call Status: Today's TOC FU Call Status:: Unsuccessful Call (1st Attempt) Unsuccessful Call (1st Attempt) Date: 05/25/23  Attempted to reach the patient regarding the most recent Inpatient/ED visit.  Follow Up Plan: Additional outreach attempts will be made to reach the patient to complete the Transitions of Care (Post Inpatient/ED visit) call.   [name] BSN RN [facility] Health Care Management Coordinator [email or web address] Direct Dial: [phone]  Fax: [phone] Website: [email or web address]

## 2023-05-25 NOTE — Transitions of Care (Post Inpatient/ED Visit) (Signed)
 05/25/2023  Name: [name] MRN: [id number] DOB: Jun 05, 1950  Today's TOC FU Call Status: Today's TOC FU Call Status:: Successful TOC FU Call Completed TOC FU Call Complete Date: 05/25/23 Patient's Name and Date of Birth confirmed.  Transition Care Management Follow-up Telephone Call Date of Discharge: 05/23/23 Discharge Facility: [facility]) Type of Discharge: Inpatient Admission Primary Inpatient Discharge Diagnosis:: Hypertensive emergency How have you been since you were released from the hospital?: Better Any questions or concerns?: Yes Patient Questions/Concerns:: Patient stated when she looked at my chart all her labs said abnormal Patient Questions/Concerns Addressed: Other: (Patient is going to speak with Dr about her concerns)  Items Reviewed: Did you receive and understand the discharge instructions provided?: Yes Medications obtained,verified, and reconciled?: Yes (Medications Reviewed) Any new allergies since your discharge?: No Dietary orders reviewed?: No Do you have support at home?: Yes People in Home [RPT]: alone Name of Support/Comfort Primary Source: Meriam Sprague  Medications Reviewed Today: Medications Reviewed Today     Reviewed by Luella Cook, RN (Case Manager) on 05/25/23 at 1100  Med List Status: <None>   Medication Order Taking? Sig Documenting Provider Last Dose Status Informant  albuterol (VENTOLIN HFA) 108 (90 Base) MCG/ACT inhaler [id number] Yes Inhale 2 puffs into the lungs every 6 hours as needed for shortness of breath Miguel Aschoff, MD Taking Active Self, Pharmacy Records  ALPRAZolam Prudy Feeler) 0.5 MG tablet [id number] Yes Take 1 tablet (0.5 mg total) by mouth at bedtime. To help with sleep. Miguel Aschoff, MD Taking Active Self, Pharmacy Records  buPROPion (WELLBUTRIN XL) 150 MG 24 hr tablet [id number] Yes Take 1 tablet (150 mg total) by mouth every morning. Miguel Aschoff, MD Taking Active Self, Pharmacy Records   Continuous Glucose Sensor (FREESTYLE LIBRE 3 PLUS SENSOR) [place] [id number] Yes Change sensor every 15 days. Miguel Aschoff, MD Taking Active Self, Pharmacy Records  Continuous Glucose Sensor (FREESTYLE LIBRE 3 [place]) [place] [id number] Yes Place 1 sensor on the skin every 14 days. Use to check glucose 6 times daily as directed Miguel Aschoff, MD Taking Active Self, Pharmacy Records  Fluticasone-Umeclidin-Vilant [facility] ELLIPTA) 100-62.5-25 MCG/ACT AEPB [id number] Yes Inhale 1 puff into the lungs daily. Oretha Milch, MD Taking Active Self, Pharmacy Records  furosemide (LASIX) 40 MG tablet [id number] Yes Take 0.5 tablets (20 mg total) by mouth daily. Miguel Aschoff, MD Taking Active Self, Pharmacy Records  gabapentin (NEURONTIN) 600 MG tablet [id number] Yes Take 1 tablet (600 mg total) by mouth at bedtime. Miguel Aschoff, MD Taking Active Self, Pharmacy Records  HYDROcodone-acetaminophen (NORCO/VICODIN) 5-325 MG tablet [id number] Yes Take 2 tablets by mouth 2 (two) times daily. Miguel Aschoff, MD Taking Active Self, Pharmacy Records  insulin degludec (TRESIBA) 100 UNIT/ML FlexTouch Pen [id number] Yes Inject 30 Units into the skin every morning. Miguel Aschoff, MD Taking Active Self, Pharmacy Records  Insulin Pen Needle 32G X 4 MM MISC [id number] Yes Use to inject insulin daily Plyler, Cecil Cranker, RD Taking Active Self, Pharmacy Records  ipratropium (ATROVENT) 0.03 % nasal spray [id number] Yes Instill 2 sprays into the nose 3 (three) times daily as directed Miguel Aschoff, MD Taking Active Self, Pharmacy Records  ipratropium-albuterol (DUONEB) 0.5-2.5 (3) MG/3ML Criss Rosales [id number] Yes Inhale 3 mLs by nebulization every 6 (six) hours as needed (shortness of breath, wheezing). Miguel Aschoff, MD Taking Active Self, Pharmacy Records  ketoconazole (NIZORAL) 2 % cream [id number] Yes Apply 1 application topically at bedtime as needed for irritation.  [name], RPH-CPP Taking  Active Self, Pharmacy Records           Med Note Sherlon Handing, [place] D   Wed May 20, 2023 10:28 AM) Has at home - hasn't used in a while  linaclotide Karlene Einstein) 72 MCG capsule [id number] Yes Take 1 capsule (72 mcg total) by mouth daily before breakfast. Miguel Aschoff, MD Taking Active Self, Pharmacy Records  losartan (COZAAR) 50 MG tablet [id number] Yes Take 1 tablet (50 mg total) by mouth daily. Morrie Sheldon, MD Taking Active   metoprolol succinate (TOPROL XL) 25 MG 24 hr tablet [id number] Yes Take 1 tablet (25 mg total) by mouth daily. Miguel Aschoff, MD Taking Active Self, Pharmacy Records  omeprazole [facility]) 40 MG capsule [id number] Yes Take 1 capsule (40 mg total) by mouth 2 (two) times daily. Miguel Aschoff, MD Taking Active Self, Pharmacy Records  ondansetron [facility]) 4 MG tablet [id number] Yes Take 1 tablet (4 mg total) by mouth daily as needed for nausea or vomiting. Kathleen Lime, MD Taking Active Self, Pharmacy Records  OXYGEN [id number] Yes Inhale 2 L into the lungs at bedtime. [provider] Taking Active Self, Pharmacy Records  potassium chloride (KLOR-CON M) 10 MEQ tablet [id number] Yes Take 1 tablet (10 mEq total) by mouth daily. Miguel Aschoff, MD Taking Active Self, Pharmacy Records  predniSONE (DELTASONE) 20 MG tablet [id number] No Take 2 tablets (40 mg total) by mouth daily.  Patient not taking: Reported on 05/20/2023   Horton, Mayer Masker, MD Not Taking Active Self, Pharmacy Records  ramelteon (ROZEREM) 8 MG tablet [id number] Yes Take 1 tablet (8 mg total) by mouth at bedtime. Miguel Aschoff, MD Taking Active Self, Pharmacy Records  senna-docusate [facility]) 8.6-50 MG tablet [id number] Yes Take 1 tablet by mouth daily after supper. Morrie Sheldon, MD Taking Active   venlafaxine XR (EFFEXOR-XR) 150 MG 24 hr capsule [id number] Yes Take 1 capsule (150 mg total) by mouth daily with breakfast. Miguel Aschoff, MD Taking Active Self, Pharmacy  Records  warfarin (COUMADIN) 2.5 MG tablet [id number] Yes Take 1 tablet (2.5 mg total) by mouth daily unless otherwise instructed Miguel Aschoff, MD Taking Active Self, Pharmacy Records            Home Care and Equipment/Supplies: Were Home Health Services Ordered?: NA Any new equipment or medical supplies ordered?: NA  Functional Questionnaire: Do you need assistance with bathing/showering or dressing?: No Do you need assistance with meal preparation?: No Do you need assistance with eating?: No Do you have difficulty maintaining continence: No Do you need assistance with getting out of bed/getting out of a chair/moving?: No Do you have difficulty managing or taking your medications?: No  Follow up appointments reviewed: PCP Follow-up appointment confirmed?: Yes Date of PCP follow-up appointment?: 06/02/23 Follow-up Provider: Dr Raynelle F[facility] Follow-up appointment confirmed?: NA Do you need transportation to your follow-up appointment?: No Do you understand care options if your condition(s) worsen?: Yes-patient verbalized understanding  SDOH Interventions Today    Flowsheet Row Most Recent Value  SDOH Interventions   Food Insecurity Interventions Patient Declined  Housing Interventions Intervention Not Indicated  Transportation Interventions Intervention Not Indicated, Patient Resources (Friends/Family)  Utilities Interventions Intervention Not Indicated      Patient declined [facility] services Gean Maidens BSN RN [facility] Health Care Management Coordinator [email or web address] Direct Dial: [phone]  Fax: [phone] Website: [email or web address]

## 2023-05-26 ENCOUNTER — Other Ambulatory Visit (HOSPITAL_COMMUNITY): Payer: Self-pay

## 2023-05-26 ENCOUNTER — Other Ambulatory Visit: Payer: Self-pay | Admitting: Student

## 2023-05-26 ENCOUNTER — Encounter: Payer: Self-pay | Admitting: Hematology

## 2023-05-26 MED ORDER — ONDANSETRON HCL 4 MG PO TABS
4.0000 mg | ORAL_TABLET | Freq: Every day | ORAL | 1 refills | Status: AC | PRN
  Filled 2023-05-26: qty 30, 30d supply, fill #0
  Filled 2023-07-18: qty 30, 30d supply, fill #1

## 2023-05-28 ENCOUNTER — Inpatient Hospital Stay

## 2023-05-28 ENCOUNTER — Encounter: Admitting: Internal Medicine

## 2023-05-28 ENCOUNTER — Other Ambulatory Visit (HOSPITAL_COMMUNITY): Payer: Self-pay

## 2023-05-28 ENCOUNTER — Other Ambulatory Visit: Payer: Self-pay

## 2023-05-28 VITALS — BP 149/60 | HR 54 | Temp 97.7°F | Resp 18

## 2023-05-28 DIAGNOSIS — I4891 Unspecified atrial fibrillation: Secondary | ICD-10-CM | POA: Diagnosis not present

## 2023-05-28 DIAGNOSIS — Z85528 Personal history of other malignant neoplasm of kidney: Secondary | ICD-10-CM | POA: Diagnosis not present

## 2023-05-28 DIAGNOSIS — D509 Iron deficiency anemia, unspecified: Secondary | ICD-10-CM | POA: Diagnosis not present

## 2023-05-28 DIAGNOSIS — Z7901 Long term (current) use of anticoagulants: Secondary | ICD-10-CM | POA: Diagnosis not present

## 2023-05-28 DIAGNOSIS — R296 Repeated falls: Secondary | ICD-10-CM | POA: Diagnosis not present

## 2023-05-28 DIAGNOSIS — D500 Iron deficiency anemia secondary to blood loss (chronic): Secondary | ICD-10-CM

## 2023-05-28 MED ORDER — ACETAMINOPHEN 325 MG PO TABS
650.0000 mg | ORAL_TABLET | Freq: Once | ORAL | Status: AC
  Administered 2023-05-28: 650 mg via ORAL
  Filled 2023-05-28: qty 2

## 2023-05-28 MED ORDER — SODIUM CHLORIDE 0.9 % IV SOLN: INTRAVENOUS | Status: DC

## 2023-05-28 MED ORDER — METHYLPREDNISOLONE SODIUM SUCC 125 MG IJ SOLR
60.0000 mg | Freq: Once | INTRAMUSCULAR | Status: AC
  Administered 2023-05-28: 60 mg via INTRAVENOUS
  Filled 2023-05-28: qty 2

## 2023-05-28 MED ORDER — LORATADINE 10 MG PO TABS
10.0000 mg | ORAL_TABLET | Freq: Once | ORAL | Status: AC
Start: 2023-05-28 — End: 2023-05-28
  Administered 2023-05-28: 10 mg via ORAL
  Filled 2023-05-28: qty 1

## 2023-05-28 MED ORDER — SODIUM CHLORIDE 0.9 % IV SOLN
750.0000 mg | Freq: Once | INTRAVENOUS | Status: AC
  Administered 2023-05-28: 750 mg via INTRAVENOUS
  Filled 2023-05-28: qty 15

## 2023-05-28 NOTE — Progress Notes (Signed)
 Patient tolerated her iron well. Stayed for her 30 minute observation- no s/s of reaction. Vss-  BP (!) 149/60 (BP Location: Left Arm, Patient Position: Sitting)   Pulse (!) 54   Temp 97.7 F (36.5 C) (Oral)   Resp 18   SpO2 97%     WC to the lobby with sister.

## 2023-05-28 NOTE — Patient Instructions (Signed)
Ferric Carboxymaltose Injection What is this medication? FERRIC CARBOXYMALTOSE (FER ik kar BOX ee MAWL tose) treats low levels of iron in your body (iron deficiency anemia). Iron is a mineral that plays an important role in making red blood cells, which carry oxygen from your lungs to the rest of your body. This medicine may be used for other purposes; ask your health care provider or pharmacist if you have questions. COMMON BRAND NAME(S): Injectafer What should I tell my care team before I take this medication? They need to know if you have any of these conditions: High blood pressure Hyperparathyroidism Inflammatory bowel disease Low levels of vitamin D in your blood Previously received ferric carboxymaltose Problems absorbing certain vitamins or phosphate in your body An unusual or allergic reaction to iron, other medications, foods, dyes, or preservatives Pregnant or trying to get pregnant Breastfeeding How should I use this medication? This medication is injected into a vein. It is given by your care team in a hospital or clinic setting. Talk to your care team about the use of this medication in children. While it may be given to children as young as 1 year for selected conditions, precautions do apply. Overdosage: If you think you have taken too much of this medicine contact a poison control center or emergency room at once. NOTE: This medicine is only for you. Do not share this medicine with others. What if I miss a dose? Keep appointments for follow-up doses. It is important not to miss your dose. Call your care team if you are unable to keep an appointment. What may interact with this medication? Do not take this medication with any of the following: Deferoxamine Dimercaprol Other iron products This list may not describe all possible interactions. Give your health care provider a list of all the medicines, herbs, non-prescription drugs, or dietary supplements you use. Also tell  them if you smoke, drink alcohol, or use illegal drugs. Some items may interact with your medicine. What should I watch for while using this medication? Visit your care team for regular checks on your progress. Tell your care team if your symptoms do not start to get better or if they get worse. You may need blood work while you are taking this medication. You may need to eat more foods that contain iron. Talk to your care team. Foods that contain iron include whole grains/cereals, dried fruits, beans, or peas, leafy green vegetables, and organ meats (liver, kidney). What side effects may I notice from receiving this medication? Side effects that you should report to your care team as soon as possible: Allergic reactions--skin rash, itching, hives, swelling of the face, lips, tongue, or throat Increase in blood pressure Low blood pressure--dizziness, feeling faint or lightheaded, blurry vision Low phosphorus level--fatigue, muscle weakness or pain, bone or joint pain, bone fractures Shortness of breath Side effects that usually do not require medical attention (report to your care team if they continue or are bothersome): Flushing Headache Nausea Pain, redness, or irritation at injection site Vomiting This list may not describe all possible side effects. Call your doctor for medical advice about side effects. You may report side effects to FDA at 1-800-FDA-1088. Where should I keep my medication? This medication is given in a hospital or clinic. It will not be stored at home. NOTE: This sheet is a summary. It may not cover all possible information. If you have questions about this medicine, talk to your doctor, pharmacist, or health care provider.  2024 Elsevier/Gold  Standard (2021-08-05 00:00:00)

## 2023-05-29 ENCOUNTER — Other Ambulatory Visit: Payer: Self-pay

## 2023-05-29 ENCOUNTER — Encounter: Payer: Self-pay | Admitting: Hematology

## 2023-06-01 ENCOUNTER — Telehealth: Payer: Self-pay | Admitting: Pharmacist

## 2023-06-01 NOTE — Telephone Encounter (Signed)
 Pt provides results of PST FS POC INR determination = 1.7 on 18.75 mg warfarin/wk. INCREASED to 21.25 mg/wk as:Su-2.5mg M-3.75mg T-2.5mg W-3.75mg Th-2.5mg F-3.75mg Sa-2.5mg  using her 2.5 mg strength warfarin tabs. No missed doses, no bleeding. Repeat 1wk [id number].

## 2023-06-02 ENCOUNTER — Ambulatory Visit: Payer: Self-pay

## 2023-06-02 ENCOUNTER — Ambulatory Visit: Admitting: Internal Medicine

## 2023-06-02 ENCOUNTER — Telehealth: Payer: Self-pay | Admitting: Internal Medicine

## 2023-06-02 ENCOUNTER — Other Ambulatory Visit: Payer: Self-pay | Admitting: Internal Medicine

## 2023-06-02 ENCOUNTER — Encounter: Admitting: Internal Medicine

## 2023-06-02 DIAGNOSIS — F5104 Psychophysiologic insomnia: Secondary | ICD-10-CM

## 2023-06-02 DIAGNOSIS — G8929 Other chronic pain: Secondary | ICD-10-CM

## 2023-06-02 DIAGNOSIS — R110 Nausea: Secondary | ICD-10-CM | POA: Diagnosis not present

## 2023-06-02 NOTE — Telephone Encounter (Signed)
 Last office visit: 04/18/2023 Last UDS: 12/16/2018 Last Refill: last written 05/04/2023 Next Appt With Internal Medicine Sherol Dixie, MD) 06/30/2023 at 8:45 AM

## 2023-06-02 NOTE — Patient Instructions (Signed)
 I'm sorry you are not feeling well!  Take an additional dose of zofran  tonight before dinner if you feel nauseous. Please contact the clinic if you are unable to keep any food or fluids down for the next 1-2 days.  My best, Dr. Rozelle Corning

## 2023-06-02 NOTE — Telephone Encounter (Signed)
 Patient states that she called earlier to cancel her appointment and then she received a notification on her MyChart regarding her appointment being rescheduled for 4/29 @ 8:45am. Patient states that she cannot come to this appointment due to her brother in law having surgery and she will not have a ride. Patient would like a appointment the week of May 5th with her PCP, the next available appt is now showing until June with patients PCP. Patient would like to see if she can be worked in and would like a later appointment. Please call patient back.    Copied from CRM [id number]. Topic: Appointments - Scheduling Inquiry for Clinic >> Jun 02, 2023  9:18 AM Bearl Botts A wrote: Reason for CRM: Patient states that she called earlier to cancel her appointment and then she received a notification on her MyChart regarding her appointment being rescheduled for 4/29 @ 8:45am. Patient states that she cannot come to this appointment due to her brother in law having surgery and she will not have a ride. Patient would like a appointment the week of May 5th with her PCP, the next available appt is now showing until June with patients PCP. Patient would like to see if she can be worked in and would like a later appointment. Please call patient back. >> Jun 02, 2023 11:49 AM Tiffany H wrote: Patient called to advise that she's extremely nauseated. Patient spoke with Dr. Verlene Glimpse who encouraged her to come to her appointment today. Patient advised that she was too ill to leave her house.   Patient wants to speak with Dr. Broadus Canes. Triaging due to Red Word: Extreme Nausea.   Tech failure during handover.

## 2023-06-02 NOTE — Telephone Encounter (Signed)
 Chief Complaint: Nausea Symptoms: Abdominal discomfort Frequency: Onset this morning around 0200 Pertinent Negatives: Patient denies vomiting, other symptoms Disposition: [] ED /[] Urgent Care (no appt availability in office) / [x] Appointment(In office/virtual)/ []  [facility]/ [] Home Care/ [] Refused Recommended Disposition /[] [facility]/ []  Follow-up with PCP Additional Notes: Patient says she's been extremely nauseated all morning and she cancelled her appointment this morning with Dr. Broadus Canes due to that. Advised I will call the office to see if there is any availability. Call [facility] and spoke to [name] who scheduled her a Telephone Visit today at 1545 with Dr. Rozelle Corning. Patient advised. Patient says she has zofran  that she can take. Advised to take it and still keep the appointment. She says it doesn't help that well, but she will take it.   Copied from CRM [id number]. Topic: Appointments - Scheduling Inquiry for Clinic >> Jun 02, 2023  9:18 AM [name] A wrote: Reason for CRM: Patient states that she called earlier to cancel her appointment and then she received a notification on her MyChart regarding her appointment being rescheduled for 4/29 @ 8:45am. Patient states that she cannot come to this appointment due to her brother in law having surgery and she will not have a ride. Patient would like a appointment the week of May 5th with her PCP, the next available appt is now showing until June with patients PCP. Patient would like to see if she can be worked in and would like a later appointment. Please call patient back. >> Jun 02, 2023 11:49 AM [name] wrote: Patient called to advise that she's extremely nauseated. Patient spoke with Dr. Verlene Glimpse who encouraged her to come to her appointment today. Patient advised that she was too ill to leave her house.   Patient wants to speak with Dr. Broadus Canes. Triaging due to Red Word: Extreme Nausea.  Reason for Disposition . Unexplained  nausea  Answer Assessment - Initial Assessment Questions 1. NAUSEA SEVERITY: "How bad is the nausea?" (e.g., mild, moderate, severe; dehydration, weight loss)   - MILD: loss of appetite without change in eating habits   - MODERATE: decreased oral intake without significant weight loss, dehydration, or malnutrition   - SEVERE: inadequate caloric or fluid intake, significant weight loss, symptoms of dehydration     Moderate 2. ONSET: "When did the nausea begin?"     Around 0200 this morning 3. VOMITING: "Any vomiting?" If Yes, ask: "How many times today?"     No 4. RECURRENT SYMPTOM: "Have you had nausea before?" If Yes, ask: "When was the last time?" "What happened that time?"     Not to this extent 5. CAUSE: "What do you think is causing the nausea?"     No idea  Protocols used: Nausea-A-AH

## 2023-06-02 NOTE — Progress Notes (Signed)
 [facility] Internal Medicine Residency Telephone Encounter Continuity Care Appointment  HPI:  This telephone encounter was created for [name] on 06/03/2023 for the following purpose/cc: nausea, general malaise.   Past Medical History:  Past Medical History:  Diagnosis Date   Abnormality of lung on CXR 02/02/2020   Nonspecific finding on CXR ordered by pulmonologist - c/w inflammation vs infection, f/u imaging suggested, Dr. Antonette Batters office has been in communication about recommended next steps.   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   Non-bleeding when visualized on capsule endoscopy 06/30/2015    Arthritis    "lower back; hands" (02/19/2018)   Asthma    Asymptomatic cholelithiasis 09/25/2015   Seen on CT scan 08/2015   Carotid artery stenosis; s/p R endarterectomy    s/p right endarterectomy (06/2010) Carotid US  (07/2010):  Left: Moderate-to-severe (60-79%) calcific and non-calcific plaque origin and proximal ICA and ECA    Chronic congestive heart failure with left ventricular diastolic dysfunction (HCC) 10/21/2010   Chronic constipation 02/03/2011   Chronic daily headache 01/16/2014   Chronic iron  deficiency anemia    Chronic low back pain 10/06/2012   Chronic venous insufficiency 08/04/2012   Closed fracture of one rib of left side 08/23/2021   ED visit after a fall 08/16/21    COPD (chronic obstructive pulmonary disease) with emphysema (HCC)    PFTs 2018: severe obstructive disease, insignif response to bronchiodilator, mild restriction parenchymal pattern, moderately severe diffusion defect. 2014  FEV1 0.92 (40%), ratio 69, 27% increase in FEV1 with BD, TLC 91%, severe airtrapping, DLCO49% On chronic home O2. Pulmonary rehab referral 05/2012    Dyspnea    Fibromyalgia 08/29/2010   Gastroesophageal reflux disease    History of  blood transfusion    "several times"  (02/19/2018)   History of clear cell renal cell carcinoma (HCC), in remission 07/21/2011   s/p cryoablation of left RCC in 09/2011 by Dr. Nereida Banning. Followed by Dr. Joie Narrow  [facility]) .     History of fracture of left hip 10/17/2022   History of hiatal hernia    History of mitral valve replacement with bioprosthetic valve due to mitral stenosis 2012   s/p MVR with a 27-mm pericardial porcine valve (Medtronic Mosaic valve, serial #[id number] on 09/20/10, Dr. Matt Song)    History of obstructive sleep apnea, resolved 2013   resolved per sleep study 07/2019; no apnea, but did have desaturation.  CPAP no longer necessary.  Nocturnal polysomnography (06/2009): Moderate sleep apnea/ hypopnea syndrome , AHI 17.8 per hour with nonpositional hypopneas. CPAP titration to 12 CWP, AHI 2.4 per hour. On nocturnal CPAP via a small resMed Quattro full-face mask with heated humidifier.    History of pneumonia    "once"  (02/19/2018)   History of seborrheic keratosis 09/28/2015   History of stroke without residual deficits 01/17/2022   Hyperlipidemia LDL goal < 100 11/20/2005   Internal and external hemorrhoids without complication 08/04/2012   Lesion of left native kidney 06/01/2020   Incidental finding on recent screening chest CT for lung ca ordered by Dr. Linder Revere pulmonology - he has ordered a f/u renal U/S.    Lichen sclerosus of female genitalia 01/12/2017   Migraine    "none in years" (02/19/2018)   Mild cognitive impairment with memory loss 12/23/2021   Moderately severe major depression (HCC) 11/19/2005   Nocturnal hypoxia per sleep study 07/2019  Osteoporosis    DEXA 2016: T -2.7; DEXA (12/09/2011): L-spine T -3.7, left hip T -1.4 DEXA (12/2004): L-spine T -2.6, left hip -0.1    Paroxysmal atrial fibrillation (HCC) 10/22/2010   s/p Left atrial maze procedure for paroxysmal atrial fibrillation on 09/20/2010 by Dr Matt Song.  Subsequent splenic infarct,  decision was made to re-anticoagulate with coumadin , likely life-long as this is the most likely cause of the splenic infarct.    Personal history of colonic polyps 05/14/2011   Colonoscopy (05/2011): 4 mm adenomatous polyp excised endoscopically Colonoscopy (02/2002): Adenomatous polyp excised endoscopically    Personal history of renal cell carcinoma 09/12/2020   Pneumonia    Pulmonary hypertension due to chronic obstructive pulmonary disease (HCC) 04/25/2016   2014 TEE w PA peak pressure 46 mmHg, s/p MV replacement    Right nephrolithiasis, asymptomatic, incidental finding 09/06/2014   5 mm non-obstructing calculus seen on CT scan 09/05/2014    Right ventricular failure (HCC) 04/25/2016   Severe obesity (BMI 35.0-39.9) with comorbidity (HCC) 10/23/2011   Sleep apnea    Stage 2 chronic kidney disease due to type 2 diabetes mellitus (HCC) 12/16/2018   CKD stage III     Tobacco abuse 07/28/2012   Type 2 diabetes mellitus with complication, with long-term current use of insulin  (HCC)    Type 2 diabetes mellitus with diabetic neuropathy (HCC)    Weight loss due to medication 03/12/2023     ROS:  Negative unless otherwise stated.   Assessment / Plan / Recommendations:  Nausea Patient complains of 1 day of general malaise and nausea without vomiting. She states that her symptoms began when she woke up overnight around 2 AM. She went to bed early the evening before last knowing that she had an appointment today at 8:45 and felt well before bed. When she woke up she moved from her bed to the chair and couldn't fall back asleep, nor did she have the desire to move back to bed. She does note that she had a low blood sugar of <70 around 3AM and she had some cereal to help which increased her blood sugar to over 300. She did not have any vomiting with this meal.   Since that time she was able to keep coffee and her medications down. She also took her insulin . She took zofran  around noon which helped  with her nausea "some" but did not provide complete relief of symptoms. She does not want to try to eat or drink anything further at this time.  She denies new foods. She had Easter dinner on Sunday but no other attendees have been ill. No sick contacts, new medications since hospital discharge, or recent travel. She is having bowel movements.  She does note some pain under her L breast that is the same pain as it was when she was hospitalized. She has applied a lidocaine  patch to the area.  Assessment: Given onset of symptoms with hypoglycemia I suspect this as the inciting cause of her symptoms, perhaps with some residual nausea with resulting hyperglycemia following her meal. She has had vomiting or GI upset lowering my suspicion for acute GI illness.  I do not suspect ACS. Fortunately she does seem to be able to tolerate PO, pending improvement of her appetite. Plan:Advised patient to redose zofran  at dinner time and attempt to eat something bland like toast, and to stay hydrated. Advised that she contact the clinic or present to ED for further evaluation if she is unable to  tolerate PO intake over the next day or two.She is in agreement with this plan.  As always, pt is advised that if symptoms worsen or new symptoms arise, they should go to an urgent care facility or to to ER for further evaluation.   Consent and Medical Decision Making:  Patient discussed with Dr. Bettejane Brownie This is a telephone encounter between [facility] and Malen Scudder on 06/03/2023 for nausea, general malaise. The visit was conducted with the patient located at home and Malen Scudder at [facility]. The patient's identity was confirmed using their DOB and current address. The patient has consented to being evaluated through a telephone encounter and understands the associated risks (an examination cannot be done and the patient may need to come in for an appointment) / benefits (allows the patient to remain at home, decreasing  exposure to coronavirus). I personally spent 15 minutes on medical discussion.

## 2023-06-02 NOTE — Telephone Encounter (Signed)
 Patient called after hours line stating that she cannot make her appt today at 8:45 AM with Dr. Broadus Canes. She states that she is not feeling well, noting that she feels nauseous and that she is worried she is going to have diarrhea. She denies any sick contacts or new food exposures, but feels that she has a GI bug. She has not been able to get out of her chair because she feels ill. I, as well as the patient's sister, encouraged her that this is all the more reason to go to her appt today, but she still insists that she cannot come. I advised the patient that I will send Dr. Broadus Canes a message regarding this.

## 2023-06-02 NOTE — Telephone Encounter (Signed)
 Copied from CRM [id number]. Topic: Clinical - Medication Refill >> Jun 02, 2023  9:15 AM Bearl Botts A wrote: Most Recent Primary Care Visit:  Provider: WILLIAMS, JULIE ANNE  Department: [facility]  Visit Type: [facility]  Date: 04/28/2023  Medication: HYDROcodone -acetaminophen  (NORCO/VICODIN) 5-325 MG tablet  ALPRAZolam  (XANAX ) 0.5 MG tablet   Has the patient contacted their pharmacy? No   Is this the correct pharmacy for this prescription? Yes  This is the patient's preferred pharmacy:  [facility] [address] [place] [address] Phone: [phone] Fax: [phone]    Has the prescription been filled recently? No  Is the patient out of the medication? No  Has the patient been seen for an appointment in the last year OR does the patient have an upcoming appointment? Yes  Can we respond through MyChart? No  Agent: Please be advised that Rx refills may take up to 3 business days. We ask that you follow-up with your pharmacy.

## 2023-06-03 NOTE — Assessment & Plan Note (Signed)
 Patient complains of 1 day of general malaise and nausea without vomiting. She states that her symptoms began when she woke up overnight around 2 AM. She went to bed early the evening before last knowing that she had an appointment today at 8:45 and felt well before bed. When she woke up she moved from her bed to the chair and couldn't fall back asleep, nor did she have the desire to move back to bed. She does note that she had a low blood sugar of <70 around 3AM and she had some cereal to help which increased her blood sugar to over 300. She did not have any vomiting with this meal.   Since that time she was able to keep coffee and her medications down. She also took her insulin . She took zofran  around noon which helped with her nausea "some" but did not provide complete relief of symptoms. She does not want to try to eat or drink anything further at this time.  She denies new foods. She had Easter dinner on Sunday but no other attendees have been ill. No sick contacts, new medications since hospital discharge, or recent travel. She is having bowel movements.  She does note some pain under her L breast that is the same pain as it was when she was hospitalized. She has applied a lidocaine  patch to the area.  Assessment: Given onset of symptoms with hypoglycemia I suspect this as the inciting cause of her symptoms, perhaps with some residual nausea with resulting hyperglycemia following her meal. She has had vomiting or GI upset lowering my suspicion for acute GI illness.  I do not suspect ACS. Fortunately she does seem to be able to tolerate PO, pending improvement of her appetite. Plan:Advised patient to redose zofran  at dinner time and attempt to eat something bland like toast, and to stay hydrated. Advised that she contact the clinic or present to ED for further evaluation if she is unable to tolerate PO intake over the next day or two.She is in agreement with this plan.

## 2023-06-04 ENCOUNTER — Inpatient Hospital Stay

## 2023-06-04 VITALS — BP 158/53 | HR 50 | Temp 98.0°F | Resp 18

## 2023-06-04 DIAGNOSIS — D509 Iron deficiency anemia, unspecified: Secondary | ICD-10-CM | POA: Diagnosis not present

## 2023-06-04 DIAGNOSIS — D500 Iron deficiency anemia secondary to blood loss (chronic): Secondary | ICD-10-CM

## 2023-06-04 MED ORDER — METHYLPREDNISOLONE SODIUM SUCC 125 MG IJ SOLR
60.0000 mg | Freq: Once | INTRAMUSCULAR | Status: AC
  Administered 2023-06-04: 60 mg via INTRAVENOUS
  Filled 2023-06-04: qty 2

## 2023-06-04 MED ORDER — ACETAMINOPHEN 325 MG PO TABS
650.0000 mg | ORAL_TABLET | Freq: Once | ORAL | Status: AC
  Administered 2023-06-04: 650 mg via ORAL
  Filled 2023-06-04: qty 2

## 2023-06-04 MED ORDER — LORATADINE 10 MG PO TABS
10.0000 mg | ORAL_TABLET | Freq: Once | ORAL | Status: AC
  Administered 2023-06-04: 10 mg via ORAL
  Filled 2023-06-04: qty 1

## 2023-06-04 MED ORDER — SODIUM CHLORIDE 0.9 % IV SOLN
750.0000 mg | Freq: Once | INTRAVENOUS | Status: AC
  Administered 2023-06-04: 750 mg via INTRAVENOUS
  Filled 2023-06-04: qty 15

## 2023-06-04 NOTE — Patient Instructions (Signed)
Ferric Carboxymaltose Injection What is this medication? FERRIC CARBOXYMALTOSE (FER ik kar BOX ee MAWL tose) treats low levels of iron in your body (iron deficiency anemia). Iron is a mineral that plays an important role in making red blood cells, which carry oxygen from your lungs to the rest of your body. This medicine may be used for other purposes; ask your health care provider or pharmacist if you have questions. COMMON BRAND NAME(S): Injectafer What should I tell my care team before I take this medication? They need to know if you have any of these conditions: High blood pressure Hyperparathyroidism Inflammatory bowel disease Low levels of vitamin D in your blood Previously received ferric carboxymaltose Problems absorbing certain vitamins or phosphate in your body An unusual or allergic reaction to iron, other medications, foods, dyes, or preservatives Pregnant or trying to get pregnant Breastfeeding How should I use this medication? This medication is injected into a vein. It is given by your care team in a hospital or clinic setting. Talk to your care team about the use of this medication in children. While it may be given to children as young as 1 year for selected conditions, precautions do apply. Overdosage: If you think you have taken too much of this medicine contact a poison control center or emergency room at once. NOTE: This medicine is only for you. Do not share this medicine with others. What if I miss a dose? Keep appointments for follow-up doses. It is important not to miss your dose. Call your care team if you are unable to keep an appointment. What may interact with this medication? Do not take this medication with any of the following: Deferoxamine Dimercaprol Other iron products This list may not describe all possible interactions. Give your health care provider a list of all the medicines, herbs, non-prescription drugs, or dietary supplements you use. Also tell  them if you smoke, drink alcohol, or use illegal drugs. Some items may interact with your medicine. What should I watch for while using this medication? Visit your care team for regular checks on your progress. Tell your care team if your symptoms do not start to get better or if they get worse. You may need blood work while you are taking this medication. You may need to eat more foods that contain iron. Talk to your care team. Foods that contain iron include whole grains/cereals, dried fruits, beans, or peas, leafy green vegetables, and organ meats (liver, kidney). What side effects may I notice from receiving this medication? Side effects that you should report to your care team as soon as possible: Allergic reactions--skin rash, itching, hives, swelling of the face, lips, tongue, or throat Increase in blood pressure Low blood pressure--dizziness, feeling faint or lightheaded, blurry vision Low phosphorus level--fatigue, muscle weakness or pain, bone or joint pain, bone fractures Shortness of breath Side effects that usually do not require medical attention (report to your care team if they continue or are bothersome): Flushing Headache Nausea Pain, redness, or irritation at injection site Vomiting This list may not describe all possible side effects. Call your doctor for medical advice about side effects. You may report side effects to FDA at 1-800-FDA-1088. Where should I keep my medication? This medication is given in a hospital or clinic. It will not be stored at home. NOTE: This sheet is a summary. It may not cover all possible information. If you have questions about this medicine, talk to your doctor, pharmacist, or health care provider.  2024 Elsevier/Gold  Standard (2021-08-05 00:00:00)

## 2023-06-04 NOTE — Progress Notes (Signed)
 Pt declined to stay for 30 minutes post observation. Pt tolerated treatment well and AVS was reviewed.

## 2023-06-05 ENCOUNTER — Other Ambulatory Visit (HOSPITAL_COMMUNITY): Payer: Self-pay

## 2023-06-05 ENCOUNTER — Ambulatory Visit: Payer: PPO

## 2023-06-05 ENCOUNTER — Other Ambulatory Visit: Payer: Self-pay

## 2023-06-05 MED ORDER — ALPRAZOLAM 0.5 MG PO TABS
0.5000 mg | ORAL_TABLET | Freq: Every day | ORAL | 0 refills | Status: DC
  Filled 2023-06-05: qty 30, 30d supply, fill #0

## 2023-06-05 MED ORDER — HYDROCODONE-ACETAMINOPHEN 5-325 MG PO TABS
2.0000 | ORAL_TABLET | Freq: Two times a day (BID) | ORAL | 0 refills | Status: DC
  Filled 2023-06-05: qty 120, 30d supply, fill #0

## 2023-06-08 DIAGNOSIS — Z7901 Long term (current) use of anticoagulants: Secondary | ICD-10-CM | POA: Diagnosis not present

## 2023-06-08 DIAGNOSIS — I480 Paroxysmal atrial fibrillation: Secondary | ICD-10-CM | POA: Diagnosis not present

## 2023-06-08 NOTE — Telephone Encounter (Signed)
 Pt reported PST FS POC INR results 3.0 on 21.25 mg warfarin/wk. Decreased to 18.75 mg/wk as 2.5 mg every day except on Friday, take one-and-one-half tablets. Repeat INR 5MAY25. No bleeding. No new medications.

## 2023-06-09 ENCOUNTER — Encounter: Admitting: Internal Medicine

## 2023-06-10 ENCOUNTER — Other Ambulatory Visit: Payer: Self-pay

## 2023-06-10 ENCOUNTER — Other Ambulatory Visit (HOSPITAL_COMMUNITY): Payer: Self-pay

## 2023-06-10 DIAGNOSIS — G4700 Insomnia, unspecified: Secondary | ICD-10-CM

## 2023-06-10 MED ORDER — RAMELTEON 8 MG PO TABS
8.0000 mg | ORAL_TABLET | Freq: Every day | ORAL | 3 refills | Status: DC
Start: 2023-06-10 — End: 2023-09-28
  Filled 2023-06-10: qty 30, 30d supply, fill #0
  Filled 2023-07-18: qty 30, 30d supply, fill #1
  Filled 2023-08-16: qty 30, 30d supply, fill #2
  Filled 2023-09-13: qty 30, 30d supply, fill #3

## 2023-06-11 ENCOUNTER — Telehealth: Payer: Self-pay | Admitting: *Deleted

## 2023-06-11 NOTE — Telephone Encounter (Signed)
 Mammogram has been rescheduled to June 10.2025 @ 11:30 am / breast center.

## 2023-06-12 ENCOUNTER — Ambulatory Visit: Payer: PPO | Admitting: Podiatry

## 2023-06-12 NOTE — Progress Notes (Signed)
 Internal Medicine Clinic Attending  Case discussed with the resident at the time of the visit.  We reviewed the resident's history and exam and pertinent patient test results.  I agree with the assessment, diagnosis, and plan of care documented in the resident's note.

## 2023-06-16 ENCOUNTER — Ambulatory Visit: Payer: Self-pay

## 2023-06-16 ENCOUNTER — Telehealth: Payer: Self-pay | Admitting: Student

## 2023-06-16 DIAGNOSIS — J439 Emphysema, unspecified: Secondary | ICD-10-CM | POA: Diagnosis not present

## 2023-06-16 DIAGNOSIS — I270 Primary pulmonary hypertension: Secondary | ICD-10-CM | POA: Diagnosis not present

## 2023-06-16 DIAGNOSIS — J449 Chronic obstructive pulmonary disease, unspecified: Secondary | ICD-10-CM | POA: Diagnosis not present

## 2023-06-16 DIAGNOSIS — I509 Heart failure, unspecified: Secondary | ICD-10-CM | POA: Diagnosis not present

## 2023-06-16 NOTE — Telephone Encounter (Signed)
    Chief Complaint: Swelling to both legs. Spoke with on call her told her to increase her Lasix  which is doing now. Cannot come in for visit and cannot due VV. Symptoms: 2 days ago. Frequency:  Pertinent Negatives: Patient denies chest pain Disposition: [] ED /[] Urgent Care (no appt availability in office) / [] Appointment(In office/virtual)/ []  [facility]/ [] Home Care/ [] Refused Recommended Disposition /[] [facility]/ [x]  Follow-up with PCP Additional Notes:   Reason for Disposition  [1] MODERATE leg swelling (e.g., swelling extends up to knees) AND [2] new-onset or worsening  Answer Assessment - Initial Assessment Questions 1. ONSET: "When did the swelling start?" (e.g., minutes, hours, days)     2 days 2. LOCATION: "What part of the leg is swollen?"  "Are both legs swollen or just one leg?"     both 3. SEVERITY: "How bad is the swelling?" (e.g., localized; mild, moderate, severe)   - Localized: Small area of swelling localized to one leg.   - MILD pedal edema: Swelling limited to foot and ankle, pitting edema < 1/4 inch (6 mm) deep, rest and elevation eliminate most or all swelling.   - MODERATE edema: Swelling of lower leg to knee, pitting edema > 1/4 inch (6 mm) deep, rest and elevation only partially reduce swelling.   - SEVERE edema: Swelling extends above knee, facial or hand swelling present.      moderate 4. REDNESS: "Does the swelling look red or infected?"     yes 5. PAIN: "Is the swelling painful to touch?" If Yes, ask: "How painful is it?"   (Scale 1-10; mild, moderate or severe)     moderate 6. FEVER: "Do you have a fever?" If Yes, ask: "What is it, how was it measured, and when did it start?"      no 7. CAUSE: "What do you think is causing the leg swelling?"     unsure 8. MEDICAL HISTORY: "Do you have a history of blood clots (e.g., DVT), cancer, heart failure, kidney disease, or liver failure?"     no 9. RECURRENT SYMPTOM: "Have you had leg  swelling before?" If Yes, ask: "When was the last time?" "What happened that time?"     yes 10. OTHER SYMPTOMS: "Do you have any other symptoms?" (e.g., chest pain, difficulty breathing)       none 11. PREGNANCY: "Is there any chance you are pregnant?" "When was your last menstrual period?"       no  Protocols used: Leg Swelling and Edema-A-AH

## 2023-06-16 NOTE — Telephone Encounter (Signed)
 Pt call dropped with specialist, this RN made attempt to reach pt, "call cannot be completed as dialed.  Copied from CRM [id number]. Topic: Clinical - Red Word Triage >> Jun 16, 2023  9:31 AM Tisa Forester wrote: Red Word that prompted transfer to Nurse Triage: patient having some swelling in left hand and lower extremity swelling for the last couple days or longer ,with some associated pain in the left wrist  patient stated not as swollen as was before . (She called afterhour provider early this morning there is a message in chart review in epic ) Due to phone issue I tried to call patient back getting a message phone can not be completed as dial

## 2023-06-16 NOTE — Telephone Encounter (Signed)
 Pt provides results of PST FS POC INR = 3.0 on 2.5mg  warfarin all days of week, EXCEPT on Friday, take 1&1/2 x 2.5 mg (3.75mg ) = 18.75 mg/wk.Goal INR 2.0 - 2.5. REDUCED wkly dose to 2.5mg  daily EXCEPT on Monday take only 1/2 x 2.5 mg (1.25mg ) = 16.25mg /wk. REPEAT INR on 12-MAY-25 and provide results to me.

## 2023-06-16 NOTE — Telephone Encounter (Signed)
 Received an after-hours page and called the patient back.  She has been having increased left hand and lower extremity swelling for the last couple days with some associated pain in the left wrist without redness or warmth.  She feels like she cannot fully close her fist with her left hand and that she has recently been gaining weight.  She denies any trauma to this arm and no swelling involving her forearm or upper arm.  She has been taking Lasix  20 mg daily and took 40 mg daily yesterday, Monday, due to the swelling.  She denies any shortness of breath, chest pain, presyncope, fevers, nausea, vomiting, or recent illness.  Discussed the low likelihood of something like septic arthritis, cellulitis, or other acute infections due to the lack of erythema and warmth.  Also low likelihood of something like a DVT due to no significant swelling in the rest of her arm and adherence to warfarin.  Most likely this is due to fluid accumulation with her history of heart failure and lowered Lasix  dose.  She is not having any shortness of breath so I do not think urgent evaluation in the emergency department is needed however I do think she would benefit from seeing someone this morning and there are a few open appointment spots in our clinic today.  She was counseled to come to the internal medicine center for an open 8:45 AM appointment.  Unfortunately she lives alone and does not have consistent transportation so she would need to call an [place].  She does have the app on her phone but has not used it yet.  Discussed reasons for her to be seen urgently in the emergency room such as shortness of breath, development of redness or warmth in her arm, or systemic infection.  I also told her to take double her Lasix  again this morning, 40 mg.  Cle[place], DO Internal Medicine Resident, PGY-2 Please contact the on call pager at [phone] for any urgent or emergent needs. 5:23 AM 06/16/2023

## 2023-06-17 ENCOUNTER — Telehealth: Payer: Self-pay | Admitting: Dietician

## 2023-06-17 NOTE — Telephone Encounter (Signed)
 Received message from patient on voicemail this am. Return call to patient: Left voicemail for return call if needed.

## 2023-06-17 NOTE — Telephone Encounter (Signed)
 Thinks she needs to see a doctor sooner than her current appointment, thought she had an appointment this month. I has been moved to June.  Is having trouble with legs for a longer than a few months, was told she has osteoarthritis in legs. When she gets up she is in excruciating pain, has difficulty walking, swelling, having panic attacks almost every day. Call transferred to triage nurse

## 2023-06-18 ENCOUNTER — Other Ambulatory Visit: Payer: Self-pay

## 2023-06-18 ENCOUNTER — Emergency Department (HOSPITAL_COMMUNITY)

## 2023-06-18 ENCOUNTER — Inpatient Hospital Stay (HOSPITAL_COMMUNITY)
Admission: EM | Admit: 2023-06-18 | Discharge: 2023-07-01 | DRG: 291 | Disposition: A | Discharge status: Skilled Nursing Facility | Attending: Internal Medicine | Admitting: Internal Medicine

## 2023-06-18 ENCOUNTER — Encounter (HOSPITAL_COMMUNITY): Payer: Self-pay

## 2023-06-18 ENCOUNTER — Ambulatory Visit: Admitting: Internal Medicine

## 2023-06-18 VITALS — BP 193/57 | HR 77 | Temp 97.6°F | Ht 62.0 in | Wt 146.4 lb

## 2023-06-18 DIAGNOSIS — I351 Nonrheumatic aortic (valve) insufficiency: Secondary | ICD-10-CM | POA: Diagnosis present

## 2023-06-18 DIAGNOSIS — M797 Fibromyalgia: Secondary | ICD-10-CM | POA: Diagnosis not present

## 2023-06-18 DIAGNOSIS — I5033 Acute on chronic diastolic (congestive) heart failure: Secondary | ICD-10-CM

## 2023-06-18 DIAGNOSIS — N182 Chronic kidney disease, stage 2 (mild): Secondary | ICD-10-CM | POA: Diagnosis present

## 2023-06-18 DIAGNOSIS — Z87442 Personal history of urinary calculi: Secondary | ICD-10-CM

## 2023-06-18 DIAGNOSIS — Z515 Encounter for palliative care: Secondary | ICD-10-CM | POA: Diagnosis not present

## 2023-06-18 DIAGNOSIS — E440 Moderate protein-calorie malnutrition: Secondary | ICD-10-CM | POA: Insufficient documentation

## 2023-06-18 DIAGNOSIS — J439 Emphysema, unspecified: Secondary | ICD-10-CM | POA: Diagnosis not present

## 2023-06-18 DIAGNOSIS — I5022 Chronic systolic (congestive) heart failure: Secondary | ICD-10-CM | POA: Diagnosis not present

## 2023-06-18 DIAGNOSIS — J449 Chronic obstructive pulmonary disease, unspecified: Secondary | ICD-10-CM | POA: Diagnosis not present

## 2023-06-18 DIAGNOSIS — F419 Anxiety disorder, unspecified: Secondary | ICD-10-CM | POA: Diagnosis present

## 2023-06-18 DIAGNOSIS — Z885 Allergy status to narcotic agent status: Secondary | ICD-10-CM

## 2023-06-18 DIAGNOSIS — I130 Hypertensive heart and chronic kidney disease with heart failure and stage 1 through stage 4 chronic kidney disease, or unspecified chronic kidney disease: Secondary | ICD-10-CM | POA: Diagnosis not present

## 2023-06-18 DIAGNOSIS — I2723 Pulmonary hypertension due to lung diseases and hypoxia: Secondary | ICD-10-CM | POA: Diagnosis present

## 2023-06-18 DIAGNOSIS — I5082 Biventricular heart failure: Secondary | ICD-10-CM | POA: Diagnosis not present

## 2023-06-18 DIAGNOSIS — F32A Depression, unspecified: Secondary | ICD-10-CM | POA: Diagnosis present

## 2023-06-18 DIAGNOSIS — Y92230 Patient room in hospital as the place of occurrence of the external cause: Secondary | ICD-10-CM | POA: Diagnosis not present

## 2023-06-18 DIAGNOSIS — M25511 Pain in right shoulder: Secondary | ICD-10-CM | POA: Diagnosis not present

## 2023-06-18 DIAGNOSIS — Z7189 Other specified counseling: Secondary | ICD-10-CM | POA: Diagnosis not present

## 2023-06-18 DIAGNOSIS — Z79899 Other long term (current) drug therapy: Secondary | ICD-10-CM | POA: Diagnosis not present

## 2023-06-18 DIAGNOSIS — R001 Bradycardia, unspecified: Secondary | ICD-10-CM | POA: Diagnosis not present

## 2023-06-18 DIAGNOSIS — I10 Essential (primary) hypertension: Secondary | ICD-10-CM | POA: Diagnosis present

## 2023-06-18 DIAGNOSIS — Z952 Presence of prosthetic heart valve: Secondary | ICD-10-CM | POA: Diagnosis not present

## 2023-06-18 DIAGNOSIS — Z961 Presence of intraocular lens: Secondary | ICD-10-CM | POA: Diagnosis present

## 2023-06-18 DIAGNOSIS — I2722 Pulmonary hypertension due to left heart disease: Secondary | ICD-10-CM | POA: Diagnosis present

## 2023-06-18 DIAGNOSIS — J984 Other disorders of lung: Secondary | ICD-10-CM | POA: Diagnosis not present

## 2023-06-18 DIAGNOSIS — E873 Alkalosis: Secondary | ICD-10-CM | POA: Diagnosis present

## 2023-06-18 DIAGNOSIS — Z7901 Long term (current) use of anticoagulants: Secondary | ICD-10-CM | POA: Diagnosis not present

## 2023-06-18 DIAGNOSIS — I509 Heart failure, unspecified: Principal | ICD-10-CM

## 2023-06-18 DIAGNOSIS — M19041 Primary osteoarthritis, right hand: Secondary | ICD-10-CM | POA: Diagnosis present

## 2023-06-18 DIAGNOSIS — R41841 Cognitive communication deficit: Secondary | ICD-10-CM | POA: Diagnosis not present

## 2023-06-18 DIAGNOSIS — R059 Cough, unspecified: Secondary | ICD-10-CM | POA: Diagnosis not present

## 2023-06-18 DIAGNOSIS — K5900 Constipation, unspecified: Secondary | ICD-10-CM | POA: Diagnosis not present

## 2023-06-18 DIAGNOSIS — I272 Pulmonary hypertension, unspecified: Secondary | ICD-10-CM | POA: Diagnosis not present

## 2023-06-18 DIAGNOSIS — W06XXXA Fall from bed, initial encounter: Secondary | ICD-10-CM | POA: Diagnosis not present

## 2023-06-18 DIAGNOSIS — Z604 Social exclusion and rejection: Secondary | ICD-10-CM | POA: Diagnosis present

## 2023-06-18 DIAGNOSIS — Z716 Tobacco abuse counseling: Secondary | ICD-10-CM

## 2023-06-18 DIAGNOSIS — I480 Paroxysmal atrial fibrillation: Secondary | ICD-10-CM | POA: Diagnosis present

## 2023-06-18 DIAGNOSIS — I517 Cardiomegaly: Secondary | ICD-10-CM | POA: Diagnosis not present

## 2023-06-18 DIAGNOSIS — Z8249 Family history of ischemic heart disease and other diseases of the circulatory system: Secondary | ICD-10-CM

## 2023-06-18 DIAGNOSIS — R0602 Shortness of breath: Secondary | ICD-10-CM | POA: Diagnosis not present

## 2023-06-18 DIAGNOSIS — I493 Ventricular premature depolarization: Secondary | ICD-10-CM | POA: Diagnosis present

## 2023-06-18 DIAGNOSIS — G8929 Other chronic pain: Secondary | ICD-10-CM | POA: Diagnosis not present

## 2023-06-18 DIAGNOSIS — Z860100 Personal history of colon polyps, unspecified: Secondary | ICD-10-CM

## 2023-06-18 DIAGNOSIS — R5381 Other malaise: Secondary | ICD-10-CM | POA: Diagnosis present

## 2023-06-18 DIAGNOSIS — I700 Atherosclerosis of aorta: Secondary | ICD-10-CM | POA: Diagnosis not present

## 2023-06-18 DIAGNOSIS — I252 Old myocardial infarction: Secondary | ICD-10-CM

## 2023-06-18 DIAGNOSIS — J9611 Chronic respiratory failure with hypoxia: Secondary | ICD-10-CM | POA: Diagnosis not present

## 2023-06-18 DIAGNOSIS — F17210 Nicotine dependence, cigarettes, uncomplicated: Secondary | ICD-10-CM | POA: Diagnosis present

## 2023-06-18 DIAGNOSIS — Z91041 Radiographic dye allergy status: Secondary | ICD-10-CM

## 2023-06-18 DIAGNOSIS — Z9841 Cataract extraction status, right eye: Secondary | ICD-10-CM

## 2023-06-18 DIAGNOSIS — W19XXXA Unspecified fall, initial encounter: Secondary | ICD-10-CM

## 2023-06-18 DIAGNOSIS — K219 Gastro-esophageal reflux disease without esophagitis: Secondary | ICD-10-CM | POA: Diagnosis present

## 2023-06-18 DIAGNOSIS — Z7951 Long term (current) use of inhaled steroids: Secondary | ICD-10-CM

## 2023-06-18 DIAGNOSIS — Z8673 Personal history of transient ischemic attack (TIA), and cerebral infarction without residual deficits: Secondary | ICD-10-CM

## 2023-06-18 DIAGNOSIS — Z8701 Personal history of pneumonia (recurrent): Secondary | ICD-10-CM

## 2023-06-18 DIAGNOSIS — Z888 Allergy status to other drugs, medicaments and biological substances status: Secondary | ICD-10-CM

## 2023-06-18 DIAGNOSIS — G4733 Obstructive sleep apnea (adult) (pediatric): Secondary | ICD-10-CM | POA: Diagnosis present

## 2023-06-18 DIAGNOSIS — R488 Other symbolic dysfunctions: Secondary | ICD-10-CM | POA: Diagnosis not present

## 2023-06-18 DIAGNOSIS — L821 Other seborrheic keratosis: Secondary | ICD-10-CM | POA: Diagnosis present

## 2023-06-18 DIAGNOSIS — Z953 Presence of xenogenic heart valve: Secondary | ICD-10-CM | POA: Diagnosis not present

## 2023-06-18 DIAGNOSIS — Z85528 Personal history of other malignant neoplasm of kidney: Secondary | ICD-10-CM

## 2023-06-18 DIAGNOSIS — I872 Venous insufficiency (chronic) (peripheral): Secondary | ICD-10-CM | POA: Diagnosis present

## 2023-06-18 DIAGNOSIS — Z91199 Patient's noncompliance with other medical treatment and regimen due to unspecified reason: Secondary | ICD-10-CM

## 2023-06-18 DIAGNOSIS — Z9981 Dependence on supplemental oxygen: Secondary | ICD-10-CM

## 2023-06-18 DIAGNOSIS — E1122 Type 2 diabetes mellitus with diabetic chronic kidney disease: Secondary | ICD-10-CM | POA: Diagnosis present

## 2023-06-18 DIAGNOSIS — D509 Iron deficiency anemia, unspecified: Secondary | ICD-10-CM | POA: Diagnosis present

## 2023-06-18 DIAGNOSIS — E785 Hyperlipidemia, unspecified: Secondary | ICD-10-CM | POA: Diagnosis not present

## 2023-06-18 DIAGNOSIS — M810 Age-related osteoporosis without current pathological fracture: Secondary | ICD-10-CM | POA: Diagnosis present

## 2023-06-18 DIAGNOSIS — F5104 Psychophysiologic insomnia: Secondary | ICD-10-CM

## 2023-06-18 DIAGNOSIS — I5032 Chronic diastolic (congestive) heart failure: Secondary | ICD-10-CM | POA: Diagnosis present

## 2023-06-18 DIAGNOSIS — M19042 Primary osteoarthritis, left hand: Secondary | ICD-10-CM | POA: Diagnosis present

## 2023-06-18 DIAGNOSIS — E1140 Type 2 diabetes mellitus with diabetic neuropathy, unspecified: Secondary | ICD-10-CM | POA: Diagnosis not present

## 2023-06-18 DIAGNOSIS — Z596 Low income: Secondary | ICD-10-CM

## 2023-06-18 DIAGNOSIS — Z794 Long term (current) use of insulin: Secondary | ICD-10-CM | POA: Diagnosis not present

## 2023-06-18 DIAGNOSIS — K5909 Other constipation: Secondary | ICD-10-CM | POA: Diagnosis present

## 2023-06-18 DIAGNOSIS — J209 Acute bronchitis, unspecified: Secondary | ICD-10-CM | POA: Diagnosis not present

## 2023-06-18 DIAGNOSIS — E119 Type 2 diabetes mellitus without complications: Secondary | ICD-10-CM | POA: Diagnosis not present

## 2023-06-18 DIAGNOSIS — Z5941 Food insecurity: Secondary | ICD-10-CM

## 2023-06-18 DIAGNOSIS — Z6823 Body mass index (BMI) 23.0-23.9, adult: Secondary | ICD-10-CM

## 2023-06-18 DIAGNOSIS — M6281 Muscle weakness (generalized): Secondary | ICD-10-CM | POA: Diagnosis not present

## 2023-06-18 DIAGNOSIS — Z9842 Cataract extraction status, left eye: Secondary | ICD-10-CM

## 2023-06-18 DIAGNOSIS — I342 Nonrheumatic mitral (valve) stenosis: Secondary | ICD-10-CM | POA: Diagnosis not present

## 2023-06-18 DIAGNOSIS — Z79891 Long term (current) use of opiate analgesic: Secondary | ICD-10-CM

## 2023-06-18 DIAGNOSIS — M47816 Spondylosis without myelopathy or radiculopathy, lumbar region: Secondary | ICD-10-CM | POA: Diagnosis present

## 2023-06-18 DIAGNOSIS — I440 Atrioventricular block, first degree: Secondary | ICD-10-CM | POA: Diagnosis present

## 2023-06-18 DIAGNOSIS — I110 Hypertensive heart disease with heart failure: Secondary | ICD-10-CM | POA: Diagnosis not present

## 2023-06-18 DIAGNOSIS — I050 Rheumatic mitral stenosis: Secondary | ICD-10-CM | POA: Diagnosis present

## 2023-06-18 DIAGNOSIS — R2689 Other abnormalities of gait and mobility: Secondary | ICD-10-CM | POA: Diagnosis not present

## 2023-06-18 DIAGNOSIS — R531 Weakness: Secondary | ICD-10-CM | POA: Diagnosis not present

## 2023-06-18 DIAGNOSIS — D72829 Elevated white blood cell count, unspecified: Secondary | ICD-10-CM | POA: Diagnosis present

## 2023-06-18 DIAGNOSIS — W19XXXD Unspecified fall, subsequent encounter: Secondary | ICD-10-CM | POA: Diagnosis not present

## 2023-06-18 DIAGNOSIS — Z5982 Transportation insecurity: Secondary | ICD-10-CM

## 2023-06-18 LAB — CBC
HCT: 41.4 % (ref 36.0–46.0)
HCT: 41.6 % (ref 36.0–46.0)
Hemoglobin: 11.3 g/dL — ABNORMAL LOW (ref 12.0–15.0)
Hemoglobin: 11.6 g/dL — ABNORMAL LOW (ref 12.0–15.0)
MCH: 24 pg — ABNORMAL LOW (ref 26.0–34.0)
MCH: 24.3 pg — ABNORMAL LOW (ref 26.0–34.0)
MCHC: 27.3 g/dL — ABNORMAL LOW (ref 30.0–36.0)
MCHC: 27.9 g/dL — ABNORMAL LOW (ref 30.0–36.0)
MCV: 88.1 fL (ref 80.0–100.0)
MCV: 87.2 fL (ref 80.0–100.0)
Platelets: 305 10*3/uL (ref 150–400)
Platelets: 312 10*3/uL (ref 150–400)
RBC: 4.7 MIL/uL (ref 3.87–5.11)
RBC: 4.77 MIL/uL (ref 3.87–5.11)
RDW: 28.7 % — ABNORMAL HIGH (ref 11.5–15.5)
RDW: 28.7 % — ABNORMAL HIGH (ref 11.5–15.5)
WBC: 8.1 10*3/uL (ref 4.0–10.5)
WBC: 7.3 10*3/uL (ref 4.0–10.5)
nRBC: 0 % (ref 0.0–0.2)
nRBC: 0 % (ref 0.0–0.2)

## 2023-06-18 LAB — BASIC METABOLIC PANEL WITH GFR
Anion gap: 9 (ref 5–15)
BUN: 12 mg/dL (ref 8–23)
CO2: 27 mmol/L (ref 22–32)
Calcium: 8.8 mg/dL — ABNORMAL LOW (ref 8.9–10.3)
Chloride: 106 mmol/L (ref 98–111)
Creatinine, Ser: 0.72 mg/dL (ref 0.44–1.00)
GFR, Estimated: >60 mL/min (ref >60)
Glucose, Bld: 120 mg/dL — ABNORMAL HIGH (ref 70–99)
Potassium: 3.8 mmol/L (ref 3.5–5.1)
Sodium: 142 mmol/L (ref 135–145)

## 2023-06-18 LAB — TROPONIN I (HIGH SENSITIVITY): Troponin I (High Sensitivity): 16 ng/L (ref <18)

## 2023-06-18 LAB — CREATININE, SERUM
Creatinine, Ser: 0.74 mg/dL (ref 0.44–1.00)
GFR, Estimated: >60 mL/min (ref >60)

## 2023-06-18 LAB — BRAIN NATRIURETIC PEPTIDE: B Natriuretic Peptide: 993.8 pg/mL — ABNORMAL HIGH (ref 0.0–100.0)

## 2023-06-18 LAB — GLUCOSE, CAPILLARY
Glucose-Capillary: 228 mg/dL — ABNORMAL HIGH (ref 70–99)
Glucose-Capillary: 130 mg/dL — ABNORMAL HIGH (ref 70–99)

## 2023-06-18 LAB — MAGNESIUM: Magnesium: 2 mg/dL (ref 1.7–2.4)

## 2023-06-18 LAB — PROTIME-INR
INR: 1.8 — ABNORMAL HIGH (ref 0.8–1.2)
Prothrombin Time: 21.5 s — ABNORMAL HIGH (ref 11.4–15.2)

## 2023-06-18 MED ORDER — GABAPENTIN 300 MG PO CAPS
600.0000 mg | ORAL_CAPSULE | Freq: Every day | ORAL | Status: DC
  Administered 2023-06-18 – 2023-06-30 (×13): 600 mg via ORAL
  Filled 2023-06-18 (×13): qty 2

## 2023-06-18 MED ORDER — FUROSEMIDE 10 MG/ML IJ SOLN
80.0000 mg | Freq: Two times a day (BID) | INTRAMUSCULAR | Status: DC
  Administered 2023-06-19: 80 mg via INTRAVENOUS
  Filled 2023-06-18: qty 8

## 2023-06-18 MED ORDER — WARFARIN - PHARMACIST DOSING INPATIENT: Freq: Every day | Status: DC

## 2023-06-18 MED ORDER — POTASSIUM CHLORIDE CRYS ER 20 MEQ PO TBCR
20.0000 meq | EXTENDED_RELEASE_TABLET | Freq: Once | ORAL | Status: AC
  Administered 2023-06-18: 20 meq via ORAL
  Filled 2023-06-18: qty 1

## 2023-06-18 MED ORDER — IPRATROPIUM-ALBUTEROL 0.5-2.5 (3) MG/3ML IN SOLN
3.0000 mL | Freq: Four times a day (QID) | RESPIRATORY_TRACT | Status: DC
  Administered 2023-06-18 – 2023-06-19 (×5): 3 mL via RESPIRATORY_TRACT
  Filled 2023-06-18 (×5): qty 3

## 2023-06-18 MED ORDER — METOPROLOL SUCCINATE ER 25 MG PO TB24
25.0000 mg | ORAL_TABLET | Freq: Every day | ORAL | Status: DC
  Administered 2023-06-18 – 2023-06-23 (×6): 25 mg via ORAL
  Filled 2023-06-18 (×6): qty 1

## 2023-06-18 MED ORDER — WARFARIN SODIUM 2.5 MG PO TABS
2.5000 mg | ORAL_TABLET | Freq: Once | ORAL | Status: AC
  Administered 2023-06-18: 2.5 mg via ORAL
  Filled 2023-06-18: qty 1

## 2023-06-18 MED ORDER — INSULIN GLARGINE-YFGN 100 UNIT/ML ~~LOC~~ SOLN
30.0000 [IU] | Freq: Every day | SUBCUTANEOUS | Status: DC
  Administered 2023-06-18 – 2023-06-22 (×5): 30 [IU] via SUBCUTANEOUS
  Filled 2023-06-18 (×5): qty 0.3

## 2023-06-18 MED ORDER — IPRATROPIUM BROMIDE 0.06 % NA SOLN
2.0000 | Freq: Three times a day (TID) | NASAL | Status: DC
Start: 2023-06-18 — End: 2023-06-18
  Filled 2023-06-18: qty 15

## 2023-06-18 MED ORDER — RAMELTEON 8 MG PO TABS
8.0000 mg | ORAL_TABLET | Freq: Every day | ORAL | Status: DC
  Administered 2023-06-18 – 2023-06-30 (×13): 8 mg via ORAL
  Filled 2023-06-18 (×14): qty 1

## 2023-06-18 MED ORDER — ACETAMINOPHEN 500 MG PO TABS
1000.0000 mg | ORAL_TABLET | Freq: Once | ORAL | Status: AC
  Administered 2023-06-18: 1000 mg via ORAL
  Filled 2023-06-18: qty 2

## 2023-06-18 MED ORDER — INSULIN ASPART 100 UNIT/ML IJ SOLN
0.0000 [IU] | Freq: Three times a day (TID) | INTRAMUSCULAR | Status: DC
  Administered 2023-06-18: 5 [IU] via SUBCUTANEOUS
  Administered 2023-06-19 (×2): 3 [IU] via SUBCUTANEOUS
  Administered 2023-06-19: 2 [IU] via SUBCUTANEOUS
  Administered 2023-06-20: 5 [IU] via SUBCUTANEOUS
  Administered 2023-06-20: 2 [IU] via SUBCUTANEOUS
  Administered 2023-06-21: 5 [IU] via SUBCUTANEOUS
  Administered 2023-06-21: 3 [IU] via SUBCUTANEOUS
  Administered 2023-06-21: 2 [IU] via SUBCUTANEOUS
  Administered 2023-06-22 (×2): 3 [IU] via SUBCUTANEOUS
  Administered 2023-06-22 – 2023-06-23 (×2): 5 [IU] via SUBCUTANEOUS
  Administered 2023-06-23: 3 [IU] via SUBCUTANEOUS
  Administered 2023-06-23: 5 [IU] via SUBCUTANEOUS
  Administered 2023-06-24: 3 [IU] via SUBCUTANEOUS
  Administered 2023-06-24: 8 [IU] via SUBCUTANEOUS
  Administered 2023-06-24: 2 [IU] via SUBCUTANEOUS
  Administered 2023-06-25: 3 [IU] via SUBCUTANEOUS
  Administered 2023-06-25: 15 [IU] via SUBCUTANEOUS
  Administered 2023-06-26: 8 [IU] via SUBCUTANEOUS
  Administered 2023-06-26: 5 [IU] via SUBCUTANEOUS
  Administered 2023-06-26: 2 [IU] via SUBCUTANEOUS
  Administered 2023-06-27: 3 [IU] via SUBCUTANEOUS
  Administered 2023-06-27: 5 [IU] via SUBCUTANEOUS
  Administered 2023-06-27: 8 [IU] via SUBCUTANEOUS
  Administered 2023-06-28: 5 [IU] via SUBCUTANEOUS
  Administered 2023-06-28: 3 [IU] via SUBCUTANEOUS

## 2023-06-18 MED ORDER — HYDROCODONE-ACETAMINOPHEN 5-325 MG PO TABS
2.0000 | ORAL_TABLET | Freq: Four times a day (QID) | ORAL | Status: DC | PRN
  Administered 2023-06-18 – 2023-06-26 (×11): 2 via ORAL
  Administered 2023-06-26: 1 via ORAL
  Administered 2023-06-27 – 2023-07-01 (×13): 2 via ORAL
  Filled 2023-06-18 (×25): qty 2

## 2023-06-18 MED ORDER — BUPROPION HCL ER (XL) 150 MG PO TB24
150.0000 mg | ORAL_TABLET | ORAL | Status: DC
Start: 2023-06-19 — End: 2023-07-01
  Administered 2023-06-19 – 2023-07-01 (×13): 150 mg via ORAL
  Filled 2023-06-18 (×13): qty 1

## 2023-06-18 MED ORDER — ENOXAPARIN SODIUM 40 MG/0.4ML IJ SOSY
40.0000 mg | PREFILLED_SYRINGE | INTRAMUSCULAR | Status: DC
  Administered 2023-06-18: 40 mg via SUBCUTANEOUS
  Filled 2023-06-18: qty 0.4

## 2023-06-18 MED ORDER — BUDESON-GLYCOPYRROL-FORMOTEROL 160-9-4.8 MCG/ACT IN AERO
2.0000 | INHALATION_SPRAY | Freq: Two times a day (BID) | RESPIRATORY_TRACT | Status: DC
  Administered 2023-06-18 – 2023-07-01 (×23): 2 via RESPIRATORY_TRACT
  Filled 2023-06-18 (×2): qty 5.9

## 2023-06-18 MED ORDER — LOSARTAN POTASSIUM 50 MG PO TABS
50.0000 mg | ORAL_TABLET | Freq: Every day | ORAL | Status: DC
  Administered 2023-06-18 – 2023-06-25 (×8): 50 mg via ORAL
  Filled 2023-06-18 (×8): qty 1

## 2023-06-18 MED ORDER — VENLAFAXINE HCL ER 75 MG PO CP24
150.0000 mg | ORAL_CAPSULE | Freq: Every day | ORAL | Status: DC
  Administered 2023-06-19 – 2023-07-01 (×13): 150 mg via ORAL
  Filled 2023-06-18 (×13): qty 2

## 2023-06-18 MED ORDER — LINACLOTIDE 72 MCG PO CAPS
72.0000 ug | ORAL_CAPSULE | Freq: Every day | ORAL | Status: DC
  Administered 2023-06-19 – 2023-07-01 (×13): 72 ug via ORAL
  Filled 2023-06-18 (×14): qty 1

## 2023-06-18 MED ORDER — FUROSEMIDE 10 MG/ML IJ SOLN
40.0000 mg | Freq: Once | INTRAMUSCULAR | Status: AC
  Administered 2023-06-18: 40 mg via INTRAVENOUS
  Filled 2023-06-18: qty 4

## 2023-06-18 MED ORDER — PANTOPRAZOLE SODIUM 40 MG PO TBEC
40.0000 mg | DELAYED_RELEASE_TABLET | Freq: Every day | ORAL | Status: DC
  Administered 2023-06-18 – 2023-07-01 (×14): 40 mg via ORAL
  Filled 2023-06-18 (×13): qty 1

## 2023-06-18 MED ORDER — IPRATROPIUM-ALBUTEROL 0.5-2.5 (3) MG/3ML IN SOLN
3.0000 mL | Freq: Once | RESPIRATORY_TRACT | Status: AC
  Administered 2023-06-18: 3 mL via RESPIRATORY_TRACT
  Filled 2023-06-18: qty 3

## 2023-06-18 MED ORDER — DULOXETINE HCL 60 MG PO CPEP
60.0000 mg | ORAL_CAPSULE | Freq: Two times a day (BID) | ORAL | Status: DC
  Administered 2023-06-18 – 2023-06-19 (×2): 60 mg via ORAL
  Filled 2023-06-18 (×2): qty 1

## 2023-06-18 MED ORDER — NICOTINE 14 MG/24HR TD PT24
14.0000 mg | MEDICATED_PATCH | Freq: Every day | TRANSDERMAL | Status: DC
  Administered 2023-06-18 – 2023-07-01 (×14): 14 mg via TRANSDERMAL
  Filled 2023-06-18 (×14): qty 1

## 2023-06-18 NOTE — ED Notes (Signed)
 PT given 2 cups of ice.

## 2023-06-18 NOTE — H&P (Signed)
 History and Physical    Patient: [name] YQM:[id number] DOB: Oct 28, 1950 DOA: 06/18/2023 DOS: the patient was seen and examined on 06/18/2023 PCP: Sherol Dixie, MD  Patient coming from: Home  Chief Complaint:  Chief Complaint  Patient presents with   Leg Swelling   Shortness of Breath   HPI: [name] is a 73 y.o. female with medical history significant of R carotid artery stenosis s/p endarterectomy, s/p MVR w/ bioprosthetic MV, HFpEF, pAF, and emphysema p/w BLE edema and DOE for months and noted to have BNP 999 and BLE edema c/w HFpEF exacerbation iso dietary indiscretion.   Pt states that she has not felt well for months. She reports having increased SOB, BLE edema to calves, and 2 pillow orthopnea for weeks. She has been evaluated by "multiple" doctors, but is reports not seeing her cardiologist for months. Pt states she has a low salt diet - "I don't add salt to anything", but admits to eating food already high in salt including frozen/canned foods, packaged meats, and grits because of her low income. Pt states that she has doubled her dose of lasix  (unknown dose) for the last week because she thought her problem was increased fluid; unfortunately, the increased dose has not improved her UOP or BLE swelling.   In the ED, pt was noted to have BNP 999 and BLE edema c/w HFpEF exacerbation iso dietary indiscretion.  Review of Systems: As mentioned in the history of present illness. All other systems reviewed and are negative. Past Medical History:  Diagnosis Date   Abnormality of lung on CXR 02/02/2020   Nonspecific finding on CXR ordered by pulmonologist - c/w inflammation vs infection, f/u imaging suggested, Dr. Antonette Batters office has been in communication about recommended next steps.   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    Non-bleeding when visualized on capsule endoscopy 06/30/2015    Arthritis    "lower back; hands" (02/19/2018)   Asthma    Asymptomatic cholelithiasis 09/25/2015   Seen on CT scan 08/2015   Carotid artery stenosis; s/p R endarterectomy    s/p right endarterectomy (06/2010) Carotid US  (07/2010):  Left: Moderate-to-severe (60-79%) calcific and non-calcific plaque origin and proximal ICA and ECA    Chronic congestive heart failure with left ventricular diastolic dysfunction (HCC) 10/21/2010   Chronic constipation 02/03/2011   Chronic daily headache 01/16/2014   Chronic iron  deficiency anemia    Chronic low back pain 10/06/2012   Chronic venous insufficiency 08/04/2012   Closed fracture of one rib of left side 08/23/2021   ED visit after a fall 08/16/21    COPD (chronic obstructive pulmonary disease) with emphysema (HCC)    PFTs 2018: severe obstructive disease, insignif response to bronchiodilator, mild restriction parenchymal pattern, moderately severe diffusion defect. 2014  FEV1 0.92 (40%), ratio 69, 27% increase in FEV1 with BD, TLC 91%, severe airtrapping, DLCO49% On chronic home O2. Pulmonary rehab referral 05/2012    Dyspnea    Fibromyalgia 08/29/2010   Gastroesophageal reflux disease    History of blood transfusion    "several times"  (02/19/2018)   History of clear cell renal cell carcinoma (HCC), in remission 07/21/2011   s/p cryoablation of left RCC in 09/2011 by Dr. Nereida Banning. Followed by Dr. Joie Narrow  [facility]) .     History of fracture of left hip 10/17/2022   History of hiatal hernia  History of mitral valve replacement with bioprosthetic valve due to mitral stenosis 2012   s/p MVR with a 27-mm pericardial porcine valve (Medtronic Mosaic valve, serial #[id number] on 09/20/10, Dr. Matt Song)    History of obstructive sleep apnea, resolved 2013   resolved per sleep study 07/2019; no apnea, but did have desaturation.  CPAP no longer necessary.  Nocturnal polysomnography  (06/2009): Moderate sleep apnea/ hypopnea syndrome , AHI 17.8 per hour with nonpositional hypopneas. CPAP titration to 12 CWP, AHI 2.4 per hour. On nocturnal CPAP via a small resMed Quattro full-face mask with heated humidifier.    History of pneumonia    "once"  (02/19/2018)   History of seborrheic keratosis 09/28/2015   History of stroke without residual deficits 01/17/2022   Hyperlipidemia LDL goal < 100 11/20/2005   Internal and external hemorrhoids without complication 08/04/2012   Lesion of left native kidney 06/01/2020   Incidental finding on recent screening chest CT for lung ca ordered by Dr. Linder Revere pulmonology - he has ordered a f/u renal U/S.    Lichen sclerosus of female genitalia 01/12/2017   Migraine    "none in years" (02/19/2018)   Mild cognitive impairment with memory loss 12/23/2021   Moderately severe major depression (HCC) 11/19/2005   Nocturnal hypoxia per sleep study 07/2019    Osteoporosis    DEXA 2016: T -2.7; DEXA (12/09/2011): L-spine T -3.7, left hip T -1.4 DEXA (12/2004): L-spine T -2.6, left hip -0.1    Paroxysmal atrial fibrillation (HCC) 10/22/2010   s/p Left atrial maze procedure for paroxysmal atrial fibrillation on 09/20/2010 by Dr Matt Song.  Subsequent splenic infarct, decision was made to re-anticoagulate with coumadin , likely life-long as this is the most likely cause of the splenic infarct.    Personal history of colonic polyps 05/14/2011   Colonoscopy (05/2011): 4 mm adenomatous polyp excised endoscopically Colonoscopy (02/2002): Adenomatous polyp excised endoscopically    Personal history of renal cell carcinoma 09/12/2020   Pneumonia    Pulmonary hypertension due to chronic obstructive pulmonary disease (HCC) 04/25/2016   2014 TEE w PA peak pressure 46 mmHg, s/p MV replacement    Right nephrolithiasis, asymptomatic, incidental finding 09/06/2014   5 mm non-obstructing calculus seen on CT scan 09/05/2014    Right ventricular failure (HCC) 04/25/2016    Severe obesity (BMI 35.0-39.9) with comorbidity (HCC) 10/23/2011   Sleep apnea    Stage 2 chronic kidney disease due to type 2 diabetes mellitus (HCC) 12/16/2018   CKD stage III     Tobacco abuse 07/28/2012   Type 2 diabetes mellitus with complication, with long-term current use of insulin  (HCC)    Type 2 diabetes mellitus with diabetic neuropathy (HCC)    Weight loss due to medication 03/12/2023   Past Surgical History:  Procedure Laterality Date   BIOPSY  07/19/2021   Procedure: BIOPSY;  Surgeon: Ozell Blunt, MD;  Location: [facility] ENDOSCOPY;  Service: Gastroenterology;;   CARDIAC CATHETERIZATION     CARDIAC VALVE REPLACEMENT  Aug. 2012   "mitral valve"   CAROTID ENDARTERECTOMY Right 07/04/2010   by Dr. [name] Cookey for asymptomatic right carotid artery stenosis   CATARACT EXTRACTION W/ INTRAOCULAR LENS  IMPLANT, BILATERAL Bilateral    CHEST TUBE INSERTION  09/24/2010   Dr Carloyn Chi Trigt   COLONOSCOPY  05/12/2011   performed by Dr. Honey Lusty. Showing small internal hemorrhoids, single tubular adenoma polyp   COLONOSCOPY WITH PROPOFOL  N/A 05/29/2020   Procedure: COLONOSCOPY WITH PROPOFOL ;  Surgeon: Ozell Blunt, MD;  Location: [facility] ENDOSCOPY;  Service: Endoscopy;  Laterality: N/A;   COLONOSCOPY WITH PROPOFOL  N/A 07/19/2021   Procedure: COLONOSCOPY WITH PROPOFOL ;  Surgeon: Ozell Blunt, MD;  Location: [facility] END[facility];  Service: Gastroenterology;  Laterality: N/A;   CRYOABLATION Left 09/2011   by Dr. Nereida Banning. Followed by Dr. Joie Narrow  [facility]) .     DILATION AND CURETTAGE OF UTERUS     ESOPHAGOGASTRODUODENOSCOPY  05/12/2011   performed by Dr. Honey Lusty. Negative for ulcerations, biopsy negative for evidence of celiac sprue   ESOPHAGOGASTRODUODENOSCOPY N/A 06/29/2015   Procedure: ESOPHAGOGASTRODUODENOSCOPY (EGD);  Surgeon: Ozell Blunt, MD;  Location: [facility] ENDOSCOPY;  Service: Endoscopy;  Laterality: N/A;   ESOPHAGOGASTRODUODENOSCOPY N/A 03/29/2016   Procedure: ESOPHAGOGASTRODUODENOSCOPY (EGD);  Surgeon: Ozell Blunt, MD;  Location: [facility] ENDOSCOPY;  Service: Endoscopy;  Laterality: N/A;   ESOPHAGOGASTRODUODENOSCOPY (EGD) WITH PROPOFOL  N/A 05/29/2020   Procedure: ESOPHAGOGASTRODUODENOSCOPY (EGD) WITH PROPOFOL ;  Surgeon: Ozell Blunt, MD;  Location: [facility] ENDOSCOPY;  Service: Endoscopy;  Laterality: N/A;   FRACTURE SURGERY     GIVENS CAPSULE STUDY N/A 06/30/2015   Procedure: GIVENS CAPSULE STUDY;  Surgeon: Ozell Blunt, MD;  Location: [facility] ENDOSCOPY;  Service: Endoscopy;  Laterality: N/A;   GIVENS CAPSULE STUDY N/A 06/29/2015   Procedure: GIVENS CAPSULE STUDY;  Surgeon: Ozell Blunt, MD;  Location: [facility] ENDOSCOPY;  Service: Endoscopy;  Laterality: N/A;   HEMORRHOID SURGERY  1970s?   "lanced"   HYSTEROSCOPY W/ ENDOMETRIAL ABLATION  06/2001   for persistent post-menopausal bleeding // by S. Saint Cranker, M.D.   INTRAMEDULLARY (IM) NAIL INTERTROCHANTERIC Left 10/19/2022   Procedure: LEFT INTRAMEDULLARY (IM) NAIL INTERTROCHANTERIC;  Surgeon: Gaylon Kea, MD;  Location: [facility] OR;  Service: Orthopedics;  Laterality: Left;   IR GENERIC HISTORICAL  08/23/2015   IR RADIOLOGIST EVAL & MGMT 08/23/2015 Erica Hau, MD [facility] INTERV RAD   IR GENERIC HISTORICAL  04/09/2016   IR RADIOLOGIST EVAL & MGMT 04/09/2016 Erica Hau, MD [facility] INTERV RAD   IR RADIOLOGIST EVAL & MGMT  10/07/2016   IR RADIOLOGIST EVAL & MGMT  06/25/2017   IR RADIOLOGIST EVAL & MGMT  07/31/2020   IR RADIOLOGIST EVAL & MGMT  10/02/2020   IR RADIOLOGIST EVAL & MGMT  12/20/2020   IR RADIOLOGIST EVAL & MGMT  12/25/2021   IR RADIOLOGIST EVAL & MGMT  04/16/2023   LEFT HEART CATH AND CORONARY ANGIOGRAPHY N/A 04/21/2016   Procedure: Left Heart Cath and Coronary Angiography;  Surgeon: Avanell Leigh, MD;  Location: [facility] INVASIVE CV LAB;  Service: Cardiovascular;  Laterality: N/A;   LIPOMA EXCISION  08/2005   occipital lipoma 1.5cm - by Dr. Corliss Dies   LITHOTRIPSY  ~ 2000   MAZE Left 09/20/10   for paroxysmal atrial fibrillation (Dr. Matt Song)   MITRAL VALVE REPLACEMENT   09/20/10    with a 27-mm pericardial porcine valve (Medtronic Mosaic valve, serial #[id number]). 09/20/10, Dr Matt Song   ORIF CLAVICLE FRACTURE Right 01/2004   by Myrtle Atta. Murrel Arnt, M.D for Right clavicle nonunion.; "it's got a pin in it"   POLYPECTOMY  05/29/2020   Procedure: POLYPECTOMY;  Surgeon: Ozell Blunt, MD;  Location: [facility] ENDOSCOPY;  Service: Endoscopy;;   POLYPECTOMY  07/19/2021   Procedure: POLYPECTOMY;  Surgeon: Ozell Blunt, MD;  Location: [facility] ENDOSCOPY;  Service: Gastroenterology;;   RADIOLOGY WITH ANESTHESIA Left 09/12/2020   Procedure: RADIOLOGY WITH ANESTHESIA- RENAL CRYOABLATION;  Surgeon: Erica Hau, MD;  Location: [facility] ORS;  Service: Radiology;  Laterality: Left;   REFRACTIVE SURGERY Bilateral    RIGHT HEART CATH N/A 04/23/2016  Procedure: Right Heart Cath;  Surgeon: Darlis Eisenmenger, MD;  Location: [facility] INVASIVE CV LAB;  Service: Cardiovascular;  Laterality: N/A;   TONSILLECTOMY     TUBAL LIGATION     Social History:  reports that she has been smoking cigarettes. She started smoking about 60 years ago. She has a 60.6 pack-year smoking history. She has never used smokeless tobacco. She reports that she does not currently use alcohol. She reports that she does not currently use drugs after having used the following drugs: Marijuana.  Allergies  Allergen Reactions   Dexilant [Dexlansoprazole] Other (See Comments)    Made acid reflux worse   Metoclopramide      Other Reaction(s): unknown   Isovue  [Iopamidol ] Itching and Rash   Lorazepam Other (See Comments)    Patient's sister noted that ativan caused the patient to become extremely confused during hospitalization 09/2010; tolerates Xanax    Oxycontin  [Oxycodone ] Other (See Comments)    headache   Tramadol Hcl Swelling    Ankle swelling    Family History  Problem Relation Age of Onset   Peptic Ulcer Disease Father    Heart attack Father 23       Died of MI at age 1   Heart attack Brother 41       Died of MI at age 7    Obesity Brother    Pneumonia Mother    Healthy Sister    Lupus Daughter    Obsessive Compulsive Disorder Daughter     Prior to Admission medications   Medication Sig Start Date End Date Taking? Authorizing Provider  albuterol  (VENTOLIN  HFA) 108 (90 Base) MCG/ACT inhaler Inhale 2 puffs into the lungs every 6 hours as needed for shortness of breath 01/21/23  Yes Sherol Dixie, MD  ALPRAZolam  (XANAX ) 0.5 MG tablet Take 1 tablet (0.5 mg total) by mouth at bedtime. To help with sleep. 06/05/23  Yes Sherol Dixie, MD  buPROPion  (WELLBUTRIN  XL) 150 MG 24 hr tablet Take 1 tablet (150 mg total) by mouth every morning. 12/24/22  Yes Sherol Dixie, MD  DULoxetine  (CYMBALTA ) 60 MG capsule Take 60 mg by mouth 2 (two) times daily.   Yes [provider]  Fluticasone -Umeclidin-Vilant (TRELEGY ELLIPTA ) 100-62.5-25 MCG/ACT AEPB Inhale 1 puff into the lungs daily. 03/22/23  Yes Lind Repine, MD  furosemide  (LASIX ) 40 MG tablet Take 0.5 tablets (20 mg total) by mouth daily. Patient taking differently: Take 40 mg by mouth daily. 04/07/23  Yes Sherol Dixie, MD  gabapentin  (NEURONTIN ) 600 MG tablet Take 1 tablet (600 mg total) by mouth at bedtime. 01/06/23  Yes Sherol Dixie, MD  HYDROcodone -acetaminophen  (NORCO/VICODIN) 5-325 MG tablet Take 2 tablets by mouth 2 (two) times daily. 06/05/23  Yes Sherol Dixie, MD  insulin  degludec (TRESIBA ) 100 UNIT/ML FlexTouch Pen Inject 30 Units into the skin every morning. 04/30/23  Yes Sherol Dixie, MD  ipratropium (ATROVENT ) 0.03 % nasal spray Instill 2 sprays into the nose 3 (three) times daily as directed 02/18/23  Yes Sherol Dixie, MD  ipratropium-albuterol  (DUONEB) 0.5-2.5 (3) MG/3ML SOLN Inhale 3 mLs by nebulization every 6 (six) hours as needed (shortness of breath, wheezing). 04/03/22  Yes Sherol Dixie, MD  linaclotide  (LINZESS ) 72 MCG capsule Take 1 capsule (72 mcg total) by mouth daily before breakfast.  12/15/22 12/10/23 Yes Sherol Dixie, MD  losartan  (COZAAR ) 50 MG tablet Take 1 tablet (50 mg total) by mouth daily. 05/23/23  Yes Maxie Spaniel, MD  metoprolol  succinate (TOPROL  XL) 25 MG 24 hr tablet Take 1 tablet (25 mg total) by mouth daily. 04/07/23  Yes Sherol Dixie, MD  omeprazole  (PRILOSEC) 40 MG capsule Take 1 capsule (40 mg total) by mouth 2 (two) times daily. 01/29/23  Yes Sherol Dixie, MD  ondansetron  (ZOFRAN ) 4 MG tablet Take 1 tablet (4 mg total) by mouth daily as needed for nausea or vomiting. 05/26/23 05/25/24 Yes Sherol Dixie, MD  OXYGEN  Inhale 2 L into the lungs at bedtime.   Yes [provider]  potassium chloride  (KLOR-CON  M) 10 MEQ tablet Take 1 tablet (10 mEq total) by mouth daily. 03/12/23  Yes Sherol Dixie, MD  ramelteon  (ROZEREM ) 8 MG tablet Take 1 tablet (8 mg total) by mouth at bedtime. 06/10/23 06/09/24 Yes Sherol Dixie, MD  venlafaxine  XR (EFFEXOR -XR) 150 MG 24 hr capsule Take 1 capsule (150 mg total) by mouth daily with breakfast. 04/07/23  Yes Sherol Dixie, MD  warfarin (COUMADIN ) 2.5 MG tablet Take 1 tablet (2.5 mg total) by mouth daily unless otherwise instructed 12/23/22  Yes Sherol Dixie, MD  Continuous Glucose Sensor (FREESTYLE LIBRE 3 PLUS SENSOR) MISC Change sensor every 15 days. 04/29/23   Sherol Dixie, MD  Insulin  Pen Needle 32G X 4 MM MISC Use to inject insulin  daily 05/18/23   Plyler, Melodie Spry, RD  ketoconazole  (NIZORAL ) 2 % cream Apply 1 application topically at bedtime as needed for irritation. Patient not taking: Reported on 06/18/2023 09/08/22   Kenda Paula, RPH-CPP  predniSONE  (DELTASONE ) 20 MG tablet Take 2 tablets (40 mg total) by mouth daily. Patient not taking: Reported on 05/20/2023 05/15/23   Horton, Vonzella Guernsey, MD  senna-docusate (SENOKOT-S) 8.6-50 MG tablet Take 1 tablet by mouth daily after supper. Patient not taking: Reported on 06/18/2023 05/23/23   Maxie Spaniel, MD    Physical  Exam: Vitals:   06/18/23 1443 06/18/23 1444 06/18/23 1445 06/18/23 1500  BP:      Pulse: 86 94 92 85  Resp: 16 (!) 23 (!) 21 (!) 25  Temp:      SpO2: 90% (!) 87% 90% 100%  Weight:      Height:       General: Alert, oriented x3, resting comfortably in no acute distress Respiratory: Bibasilar rales; no wheezing Cardiovascular: Regular rate and rhythm w/o m/r/g  Data Reviewed:  Lab Results  Component Value Date   WBC 8.1 06/18/2023   HGB 11.3 (L) 06/18/2023   HCT 41.4 06/18/2023   MCV 88.1 06/18/2023   PLT 305 06/18/2023   Lab Results  Component Value Date   GLUCOSE 120 (H) 06/18/2023   CALCIUM  8.8 (L) 06/18/2023   NA 142 06/18/2023   K 3.8 06/18/2023   CO2 27 06/18/2023   CL 106 06/18/2023   BUN 12 06/18/2023   CREATININE 0.72 06/18/2023   Lab Results  Component Value Date   ALT 9 05/19/2023   AST 9 (L) 05/19/2023   ALKPHOS 50 05/19/2023   BILITOT 0.5 05/19/2023   Lab Results  Component Value Date   INR 2.1 (H) 05/23/2023   INR 1.8 (H) 05/22/2023   INR 1.4 (H) 05/21/2023    Radiology: DG Chest 2 View Result Date: 06/18/2023 CLINICAL DATA:  Shortness of breath, cough. EXAM: CHEST - 2 VIEW COMPARISON:  Radiographs 05/20/2023, 05/15/2023 and 11/19/2022. CT 10/17/2022. FINDINGS: Stable mild cardiac enlargement post median sternotomy and mitral valve replacement. There is aortic atherosclerosis. Chronic blunting of the left  costophrenic sulcus. Mild chronic interstitial prominence and scattered pulmonary scarring. No edema, confluent airspace disease, pneumothorax or significant pleural effusion. The bones appear unchanged post right clavicular ORIF. Old rib fractures are noted on the right. No acute osseous findings are seen. IMPRESSION: No evidence of acute cardiopulmonary process. Chronic interstitial prominence and scattered pulmonary scarring. Electronically Signed   By: Elmon Hagedorn M.D.   On: 06/18/2023 12:33    Assessment and Plan: 55F h/o R carotid artery  stenosis s/p endarterectomy, s/p MVR w/ bioprosthetic MV, HFpEF, pAF, and emphysema p/w BLE edema and DOE for months and noted to have BNP 999 and BLE edema c/w HFpEF exacerbation iso dietary indiscretion.   #ADHF #HFpEF exacerbation #Elevated BNP Elevated BNP and BLE edema. EF 60-65% in 05/2023 w/ signs of RHF. -RD consulted for low sodium teaching; apprec recs -IV lasix  80mg  BID for now; goal net neg 1-2L/d; strict I/Os; daily standing weights; K>4/Mg>2 -If no improvement with diuresis, consult cardiology; pt may benefit from RHC or OP pulm HTN referral  #H/o pAF #Status post MVR with bioprosthetic mitral valve  -PTA BB and warfarin (apprec pharmacy input)  #H/o Emphysema Low concern for AECOPD; Presumably increased fluid from Na is precipitating factor -Breztri BID + duonebs  #T2DM -Semglee  30U daily + SSI TID AC prn  #OSA -CPAP nightly  Advance Care Planning:   Code Status: Full Code   Consults: N/A  Family Communication: N/A  Severity of Illness: The appropriate patient status for this patient is INPATIENT. Inpatient status is judged to be reasonable and necessary in order to provide the required intensity of service to ensure the patient's safety. The patient's presenting symptoms, physical exam findings, and initial radiographic and laboratory data in the context of their chronic comorbidities is felt to place them at high risk for further clinical deterioration. Furthermore, it is not anticipated that the patient will be medically stable for discharge from the hospital within 2 midnights of admission.   * I certify that at the point of admission it is my clinical judgment that the patient will require inpatient hospital care spanning beyond 2 midnights from the point of admission due to high intensity of service, high risk for further deterioration and high frequency of surveillance required.*   ------- I spent 55 minutes reviewing previous labs/notes, obtaining separate  history at the bedside, counseling/discussing the treatment plan outlined above, ordering medications/tests, and performing clinical documentation.  Author: Arne Langdon, MD 06/18/2023 4:15 PM  For on call review [email or web address].

## 2023-06-18 NOTE — Assessment & Plan Note (Signed)
 Acute exacerbation has been gradually developing since hospital discharge.  I feel that the Lasix  20 mg is probably insufficient-she was reduced from a higher dose some months ago due to being clinically volume depleted though had enjoyed a long stretch of time without heart failure exacerbation prior to that.  She has mild acute hypoxic respiratory failure and care is complicated by comorbid A-fib and COPD.  She is at high risk for recurrent falls and significant injury and lives alone.  Best course of action will be hospitalization to initiate safe monitored diuresis and to assess her functional status at this moment.  I have been wanting her to get transferred to an SNF for medical monitoring while she undergoes PT for further cleaning and gait training.  She has polypharmacy which is difficult for any patient to manage alone.

## 2023-06-18 NOTE — Progress Notes (Signed)
 73 y.o. [name] is here for delayed hospital f/u (was ill on unable to attend her earlier visit).  Since our last visit together on 04/28/2023, she had a follow-up visit with vascular on 4/3, was seen in the ED on 4/4 for atypical chest pain and suspected COPD exacerbation, saw hematology for iron  deficiency anemia on 4/8, and was admitted to the hospital from 4/9 - 4/12 for CHF and COPD exacerbation which may have been triggered by her HVAC system breaking down requiring her to keep the windows open which exposed her to heavy pollen at that time..   She reports not doing well since hospital discharge, experiencing worsening bilateral lower extremity swelling and pain and on occasion also some swelling in her hands (left greater than right, has improved somewhat in the interim).  Her weight has been increasing (though this has occurred simultaneously with cessation of GLP-1 a several weeks ago, as further weight loss was not desired).   Taking lasix  20 mg daily, she thinks.  I cannot tell for certain that she is taking her medications as prescribed recently.  She states that she is not eating or drinking much and not getting around at all because of her pain and feeling so poorly.  She has been feeling short of breath "I always feel short of breath" which does not seem to be worse when supine.    Patient Active Problem List   Diagnosis Date Noted   Acute on chronic heart failure (HCC) 06/18/2023   Acute exacerbation of chronic obstructive pulmonary disease (COPD) (HCC) 05/20/2023   Hypertensive urgency 05/20/2023   Iron  deficiency anemia 05/19/2023   IDA (iron  deficiency anemia) 05/19/2023   Atrial fibrillation (HCC) 12/23/2022   History of atrial fibrillation 10/18/2022   Chronic anticoagulation 10/18/2022   History of fracture of left hip 10/17/2022   Generalized pain 08/01/2022   Multifactorial functional impairment 02/13/2022   History of stroke without residual deficits  01/17/2022   Diabetic peripheral neuropathy associated with type 2 diabetes mellitus (HCC) 11/11/2021   Recurrent falls 11/11/2021   Gait instability 09/19/2021   Perennial non-allergic rhinitis 09/19/2021   Chronic, continuous use of opioids for chronic pain 07/10/2021   Polypharmacy 11/08/2020   Personal history of renal cell carcinoma 09/12/2020   Tinea pedis of both feet 08/07/2020   Essential tremor 11/17/2019   Nocturnal hypoxia per sleep study 07/2019    Carotid artery stenosis; s/p R endarterectomy    COPD mixed type (HCC)    Arthritis of both hands 07/01/2019   Stage 2 chronic kidney disease due to type 2 diabetes mellitus (HCC) 12/16/2018   Lichen sclerosus of female genitalia 01/12/2017   Mixed stress and urge urinary incontinence 05/27/2016   Pulmonary hypertension due to chronic obstructive pulmonary disease (HCC) 04/25/2016   Asymptomatic cholelithiasis 09/25/2015   Nausea 07/14/2015   Chronic iron  deficiency anemia 12/26/2014   Aortic atherosclerosis (HCC) 10/19/2014   Right nephrolithiasis, asymptomatic (incidental finding) 09/06/2014   Headache, chronic intermittent 01/14/2013   Chronic low back pain 10/06/2012   Long term current use of anticoagulant therapy 09/30/2012   Recurrent candidal intertrigo 07/28/2012   Tobacco use disorder 07/28/2012   History of colonic polyps 05/14/2011   Sleep disturbance 05/14/2011   Abdominal wall hernia 05/14/2011   Constipation due to opioid therapy 02/03/2011   Shortness of breath 12/06/2010   Health care maintenance 11/04/2010   PAF (paroxysmal atrial fibrillation) (HCC) 10/22/2010   Chronic diastolic heart failure (HCC) 10/21/2010  Fibromyalgia 08/29/2010   Gastroesophageal reflux disease    Type 2 diabetes mellitus with complication, with long-term current use of insulin  (HCC)    Osteoporosis with current pathological fracture with routine healing    Peripheral arterial disease (HCC) 05/27/2010   History of mitral valve  replacement with bioprosthetic valve 2012   Essential hypertension 06/19/2008   Hyperlipidemia 11/20/2005   Severe major depression (HCC) 11/19/2005   No current facility-administered medications for this visit. No current outpatient medications on file.  Facility-Administered Medications Ordered in Other Visits:    budeson-glycopyrrolate -formoterol  (BREZTRI) 160-9-4.8 MCG/ACT inhaler 2 puff, 2 puff, Inhalation, BID, Arne Langdon, MD   Cecily Cohen ON 06/19/2023] buPROPion  (WELLBUTRIN  XL) 24 hr tablet 150 mg, 150 mg, Oral, Solon Dura, MD   DULoxetine  (CYMBALTA ) DR capsule 60 mg, 60 mg, Oral, BID, Arne Langdon, MD   enoxaparin  (LOVENOX ) injection 40 mg, 40 mg, Subcutaneous, Q24H, Arne Langdon, MD   Cecily Cohen ON 06/19/2023] furosemide  (LASIX ) injection 80 mg, 80 mg, Intravenous, BID, Arne Langdon, MD   gabapentin  (NEURONTIN ) capsule 600 mg, 600 mg, Oral, QHS, Arne Langdon, MD   HYDROcodone -acetaminophen  (NORCO/VICODIN) 5-325 MG per tablet 2 tablet, 2 tablet, Oral, Q6H PRN, Arne Langdon, MD   insulin  aspart (novoLOG ) injection 0-15 Units, 0-15 Units, Subcutaneous, TID WC, Arne Langdon, MD   insulin  glargine-yfgn (SEMGLEE ) injection 30 Units, 30 Units, Subcutaneous, Daily, Arne Langdon, MD   ipratropium-albuterol  (DUONEB) 0.5-2.5 (3) MG/3ML nebulizer solution 3 mL, 3 mL, Nebulization, Q6H, Arne Langdon, MD   Cecily Cohen ON 06/19/2023] linaclotide  (LINZESS ) capsule 72 mcg, 72 mcg, Oral, QAC breakfast, Arne Langdon, MD   losartan  (COZAAR ) tablet 50 mg, 50 mg, Oral, Daily, Arne Langdon, MD   metoprolol  succinate (TOPROL -XL) 24 hr tablet 25 mg, 25 mg, Oral, Daily, Arne Langdon, MD   pantoprazole  (PROTONIX ) EC tablet 40 mg, 40 mg, Oral, Daily, Arne Langdon, MD   ramelteon  (ROZEREM ) tablet 8 mg, 8 mg, Oral, QHS, Arne Langdon, MD   Cecily Cohen ON 06/19/2023] venlafaxine  XR (EFFEXOR -XR) 24 hr capsule 150 mg, 150 mg, Oral, Q breakfast, Arne Langdon, MD  Functional Status: Increasingly limited.  She  continues to live at home.  Uses a rollator inconsistently.  Recurrent falls.  Independent in her ADLs.  Transportation provided by her sister or arranged medical transport service.  Objective BP (!) 193/57 (BP Location: Left Arm, Patient Position: Sitting, Cuff Size: Small)   Pulse 77   Temp 97.6 F (36.4 C) (Oral)   Ht 5\' 2"  (1.575 m)   Wt 146 lb 6.4 oz (66.4 kg)   SpO2 90%   BMI 26.78 kg/m  O2 sats between 86 and 90% on room air (normally in the mid to high 90s). She looks unwell.  Tremulous, which often occurs when she feels poorly.  Face is rounded and appears edematous.  Very mild edema noted in the left hand, barely perceptible, she states it has improved.  Skin turgor is poor over the upper body, though she is edematous from the waist down.  Pitting edema noted presacral.  Her legs are very tightly edematous below the knees and hyperemic with impending loss of skin integrity from blistering anteriorly.  The skin is red but without increased warmth and without localized tenderness.  Her anterior knees have a violaceous hue which is blanching.  She does not have joint line tenderness.  There does seem to be some swelling around the knee but not necessarily intra-articular.  No tenderness in the popliteal spaces.  She has delayed capillary refill which  is chronic for her, as is cyanotic distal feet.  Lungs with posterior rales.  JVD present.  Heart irregular irregular today.    Assessment and Plan: There are no diagnoses linked to this encounter.   No follow-ups on file.

## 2023-06-18 NOTE — ED Provider Notes (Signed)
 [facility] Provider Note   CSN: [id number] Arrival date & time: 06/18/23  1125     History  Chief Complaint  Patient presents with   Leg Swelling   Shortness of Breath   HPI [name] is a 73 y.o. female with CHF, COPD, paroxysmal A-fib, hypertension, PAD, type 2 diabetes presenting for shortness of breath and leg swelling.  She states her shortness of breath has been going on for several months but worse in the last couple of weeks.  It is made worse with exertion.  Denies associated chest pain.  Also states that her legs have been more swollen than usual she states she is compliant with taking her Lasix .  States she is also had a cough.  Reports that she wears oxygen  at night but does not have an oxygen  requirement during the day.  Reports she does smoke about a pack of cigarettes a day.  Did not take her blood pressure medication this morning as well.   Shortness of Breath      Home Medications Prior to Admission medications   Medication Sig Start Date End Date Taking? Authorizing Provider  albuterol  (VENTOLIN  HFA) 108 (90 Base) MCG/ACT inhaler Inhale 2 puffs into the lungs every 6 hours as needed for shortness of breath 01/21/23  Yes Sherol Dixie, MD  ALPRAZolam  (XANAX ) 0.5 MG tablet Take 1 tablet (0.5 mg total) by mouth at bedtime. To help with sleep. 06/05/23  Yes Sherol Dixie, MD  buPROPion  (WELLBUTRIN  XL) 150 MG 24 hr tablet Take 1 tablet (150 mg total) by mouth every morning. 12/24/22  Yes Sherol Dixie, MD  DULoxetine  (CYMBALTA ) 60 MG capsule Take 60 mg by mouth 2 (two) times daily.   Yes [provider]  Fluticasone -Umeclidin-Vilant (TRELEGY ELLIPTA ) 100-62.5-25 MCG/ACT AEPB Inhale 1 puff into the lungs daily. 03/22/23  Yes Lind Repine, MD  furosemide  (LASIX ) 40 MG tablet Take 0.5 tablets (20 mg total) by mouth daily. Patient taking differently: Take 40 mg by mouth daily. 04/07/23  Yes  Sherol Dixie, MD  gabapentin  (NEURONTIN ) 600 MG tablet Take 1 tablet (600 mg total) by mouth at bedtime. 01/06/23  Yes Sherol Dixie, MD  HYDROcodone -acetaminophen  (NORCO/VICODIN) 5-325 MG tablet Take 2 tablets by mouth 2 (two) times daily. 06/05/23  Yes Sherol Dixie, MD  insulin  degludec (TRESIBA ) 100 UNIT/ML FlexTouch Pen Inject 30 Units into the skin every morning. 04/30/23  Yes Sherol Dixie, MD  ipratropium (ATROVENT ) 0.03 % nasal spray Instill 2 sprays into the nose 3 (three) times daily as directed 02/18/23  Yes Sherol Dixie, MD  ipratropium-albuterol  (DUONEB) 0.5-2.5 (3) MG/3ML SOLN Inhale 3 mLs by nebulization every 6 (six) hours as needed (shortness of breath, wheezing). 04/03/22  Yes Sherol Dixie, MD  linaclotide  (LINZESS ) 72 MCG capsule Take 1 capsule (72 mcg total) by mouth daily before breakfast. 12/15/22 12/10/23 Yes Sherol Dixie, MD  losartan  (COZAAR ) 50 MG tablet Take 1 tablet (50 mg total) by mouth daily. 05/23/23  Yes Maxie Spaniel, MD  metoprolol  succinate (TOPROL  XL) 25 MG 24 hr tablet Take 1 tablet (25 mg total) by mouth daily. 04/07/23  Yes Sherol Dixie, MD  omeprazole  (PRILOSEC) 40 MG capsule Take 1 capsule (40 mg total) by mouth 2 (two) times daily. 01/29/23  Yes Sherol Dixie, MD  ondansetron  (ZOFRAN ) 4 MG tablet Take 1 tablet (4 mg total) by mouth daily as needed for nausea or  vomiting. 05/26/23 05/25/24 Yes Sherol Dixie, MD  OXYGEN  Inhale 2 L into the lungs at bedtime.   Yes [provider]  potassium chloride  (KLOR-CON  M) 10 MEQ tablet Take 1 tablet (10 mEq total) by mouth daily. 03/12/23  Yes Sherol Dixie, MD  ramelteon  (ROZEREM ) 8 MG tablet Take 1 tablet (8 mg total) by mouth at bedtime. 06/10/23 06/09/24 Yes Sherol Dixie, MD  venlafaxine  XR (EFFEXOR -XR) 150 MG 24 hr capsule Take 1 capsule (150 mg total) by mouth daily with breakfast. 04/07/23  Yes Sherol Dixie, MD  warfarin  (COUMADIN ) 2.5 MG tablet Take 1 tablet (2.5 mg total) by mouth daily unless otherwise instructed 12/23/22  Yes Sherol Dixie, MD  Continuous Glucose Sensor (FREESTYLE LIBRE 3 PLUS SENSOR) MISC Change sensor every 15 days. 04/29/23   Sherol Dixie, MD  Insulin  Pen Needle 32G X 4 MM MISC Use to inject insulin  daily 05/18/23   Plyler, Melodie Spry, RD  ketoconazole  (NIZORAL ) 2 % cream Apply 1 application topically at bedtime as needed for irritation. Patient not taking: Reported on 06/18/2023 09/08/22   Kenda Paula, RPH-CPP  predniSONE  (DELTASONE ) 20 MG tablet Take 2 tablets (40 mg total) by mouth daily. Patient not taking: Reported on 05/20/2023 05/15/23   Horton, Vonzella Guernsey, MD  senna-docusate (SENOKOT-S) 8.6-50 MG tablet Take 1 tablet by mouth daily after supper. Patient not taking: Reported on 06/18/2023 05/23/23   Maxie Spaniel, MD      Allergies    Dexilant [dexlansoprazole], Metoclopramide , Isovue  [iopamidol ], Lorazepam, Oxycontin  [oxycodone ], and Tramadol hcl    Review of Systems   Review of Systems  Respiratory:  Positive for shortness of breath.     Physical Exam   Vitals:   06/18/23 1445 06/18/23 1500  BP:    Pulse: 92 85  Resp: (!) 21 (!) 25  Temp:    SpO2: 90% 100%    CONSTITUTIONAL:  well-appearing, NAD NEURO:  Alert and oriented x 3, CN 3-12 grossly intact EYES:  eyes equal and reactive ENT/NECK:  Supple, no stridor  CARDIO:  regular rate and rhythm, appears well-perfused  PULM:  No respiratory distress, on room air, lung sounds diffusely diminished worse in bases, no wheezing or crackles GI/GU:  non-distended, soft MSK/SPINE:  No gross deformities, moving all extremities, 2+ pitting edema in both feet extending up above the knee bilaterally. SKIN:  no rash, atraumatic  *Additional and/or pertinent findings included in MDM below   ED Results / Procedures / Treatments   Labs (all labs ordered are listed, but only abnormal results are displayed) Labs Reviewed   BASIC METABOLIC PANEL WITH GFR - Abnormal; Notable for the following components:      Result Value   Glucose, Bld 120 (*)    Calcium  8.8 (*)    All other components within normal limits  CBC - Abnormal; Notable for the following components:   Hemoglobin 11.3 (*)    MCH 24.0 (*)    MCHC 27.3 (*)    RDW 28.7 (*)    All other components within normal limits  BRAIN NATRIURETIC PEPTIDE - Abnormal; Notable for the following components:   B Natriuretic Peptide 993.8 (*)    All other components within normal limits  MAGNESIUM   TROPONIN I (HIGH SENSITIVITY)    EKG None  Radiology DG Chest 2 View Result Date: 06/18/2023 CLINICAL DATA:  Shortness of breath, cough. EXAM: CHEST - 2 VIEW COMPARISON:  Radiographs 05/20/2023, 05/15/2023 and 11/19/2022. CT 10/17/2022. FINDINGS: Stable  mild cardiac enlargement post median sternotomy and mitral valve replacement. There is aortic atherosclerosis. Chronic blunting of the left costophrenic sulcus. Mild chronic interstitial prominence and scattered pulmonary scarring. No edema, confluent airspace disease, pneumothorax or significant pleural effusion. The bones appear unchanged post right clavicular ORIF. Old rib fractures are noted on the right. No acute osseous findings are seen. IMPRESSION: No evidence of acute cardiopulmonary process. Chronic interstitial prominence and scattered pulmonary scarring. Electronically Signed   By: Elmon Hagedorn M.D.   On: 06/18/2023 12:33    Procedures .Critical Care  Performed by: Janalee Mcmurray, PA-C Authorized by: Janalee Mcmurray, PA-C   Critical care provider statement:    Critical care time (minutes):  30   Critical care was necessary to treat or prevent imminent or life-threatening deterioration of the following conditions:  Respiratory failure (CHF exacerbation with hypoxia and oxygen  requirement)   Critical care was time spent personally by me on the following activities:  Development of treatment plan with  patient or surrogate, discussions with consultants, evaluation of patient's response to treatment, examination of patient, ordering and review of laboratory studies, ordering and review of radiographic studies, ordering and performing treatments and interventions, pulse oximetry, re-evaluation of patient's condition and review of old charts     Medications Ordered in ED Medications  furosemide  (LASIX ) injection 40 mg (40 mg Intravenous Given 06/18/23 1401)  potassium chloride  SA (KLOR-CON  M) CR tablet 20 mEq (20 mEq Oral Given 06/18/23 1400)  ipratropium-albuterol  (DUONEB) 0.5-2.5 (3) MG/3ML nebulizer solution 3 mL (3 mLs Nebulization Given 06/18/23 1409)  acetaminophen  (TYLENOL ) tablet 1,000 mg (1,000 mg Oral Given 06/18/23 1454)    ED Course/ Medical Decision Making/ A&P                                 Medical Decision Making Amount and/or Complexity of Data Reviewed Labs: ordered. Radiology: ordered.  Risk OTC drugs. Prescription drug management.   Initial Impression and Ddx 36 well-appearing female presenting for shortness of breath and leg swelling.  Exam notable for bilateral lower extremity pitting edema and diminished breath sounds bilaterally. DDx includes CHF exacerbation, ACS, pneumonia, pneumothorax, PE, other. Patient PMH that increases complexity of ED encounter:  CHF, COPD, paroxysmal A-fib, hypertension, PAD, type 2 diabetes   Interpretation of Diagnostics - I independent reviewed and interpreted the labs as followed: elevated BNP (~3x higher than last check)  - I independently visualized the following imaging with scope of interpretation limited to determining acute life threatening conditions related to emergency care: CXR, which revealed no acute findings  - I personally reviewed and interpreted EKG which revealed atrial fib  Patient Reassessment and Ultimate Disposition/Management On reassessment, was more short of breath and hypoxic.  Placed on 2.5 L of oxygen  and  stated that her symptoms did improve.  BNP notably elevated as well.  Workup overall with clinical findings suggest CHF exacerbation with oxygen  requirement.  Admitted to hospital service.  Patient management required discussion with the following services or consulting groups:  Hospitalist Service  Complexity of Problems Addressed Acute complicated illness or Injury  Additional Data Reviewed and Analyzed Further history obtained from: Past medical history and medications listed in the EMR and Prior ED visit notes  Patient Encounter Risk Assessment Consideration of hospitalization         Final Clinical Impression(s) / ED Diagnoses Final diagnoses:  Acute on chronic congestive heart failure, unspecified heart failure type (  [facility])    Rx / DC Orders ED Discharge Orders     None         Razia Screws K, PA-C 06/18/23 1529    Karlyn Overman, MD 06/18/23 [phone]

## 2023-06-18 NOTE — Progress Notes (Signed)
 PHARMACY - ANTICOAGULATION CONSULT NOTE  Pharmacy Consult for warfarin Indication: atrial fibrillation  Allergies  Allergen Reactions   Dexilant [Dexlansoprazole] Other (See Comments)    Made acid reflux worse   Metoclopramide      Other Reaction(s): unknown   Isovue  [Iopamidol ] Itching and Rash   Lorazepam Other (See Comments)    Patient's sister noted that ativan caused the patient to become extremely confused during hospitalization 09/2010; tolerates Xanax    Oxycontin  [Oxycodone ] Other (See Comments)    headache   Tramadol Hcl Swelling    Ankle swelling    Patient Measurements: Height: 5\' 2"  (157.5 cm) Weight: 65 kg (143 lb 4.8 oz) IBW/kg (Calculated) : 50.1 HEPARIN  DW (KG): 63.3  Vital Signs: Temp: 97.7 F (36.5 C) (05/08 1642) Temp Source: Oral (05/08 1642) BP: 209/78 (05/08 1642) Pulse Rate: 80 (05/08 1642)  Labs: Recent Labs    06/18/23 1223 06/18/23 1606 06/18/23 1735  HGB 11.3* 11.6*  --   HCT 41.4 41.6  --   PLT 305 312  --   LABPROT  --   --  21.5*  INR  --   --  1.8*  CREATININE 0.72 0.74  --   TROPONINIHS 16  --   --     Estimated Creatinine Clearance: 54.6 mL/min (by C-G formula based on SCr of 0.74 mg/dL).   Assessment: 73 YO female with PMH significant for R carotid artery stenosis s/p endarterectomy, MVR w/ bioprosthetic MV and atrial fibrillation on warfarin PTA who is admitted for eleavted BNP and BLE edema. Pharmacy consulted to manage warfarin. INR 1.8 on admission, last warfarin dose 5/07 per patient. CBC stable.  PTA warfarin regimen: 2.5mg  daily EXCEPT on Monday take only 1/2 x 2.5 mg (1.25mg )   Goal of Therapy:  INR 2-3 Monitor platelets by anticoagulation protocol: Yes   Plan:  Give warfarin 2.5 mg PO x1 dose Check INR daily while on warfarin Continue to monitor H&H and platelets   Thank you for allowing pharmacy to be a part of this patient's care.  Claudia Cuff, PharmD, BCPS Clinical Pharmacist

## 2023-06-18 NOTE — ED Notes (Signed)
 Patient transported to X-ray

## 2023-06-18 NOTE — Plan of Care (Signed)
  Problem: Education: Goal: Knowledge of General Education information will improve Description: Including pain rating scale, medication(s)/side effects and non-pharmacologic comfort measures Outcome: Progressing   Problem: Health Behavior/Discharge Planning: Goal: Ability to manage health-related needs will improve Outcome: Progressing   Problem: Elimination: Goal: Will not experience complications related to bowel motility Outcome: Progressing Goal: Will not experience complications related to urinary retention Outcome: Progressing   Problem: Safety: Goal: Ability to remain free from injury will improve Outcome: Progressing   Problem: Fluid Volume: Goal: Ability to maintain a balanced intake and output will improve Outcome: Progressing   Problem: Nutritional: Goal: Maintenance of adequate nutrition will improve Outcome: Progressing   Problem: Clinical Measurements: Goal: Respiratory complications will improve Outcome: Not Progressing   Problem: Activity: Goal: Risk for activity intolerance will decrease Outcome: Not Progressing   Problem: Coping: Goal: Level of anxiety will decrease Outcome: Not Progressing   Problem: Skin Integrity: Goal: Risk for impaired skin integrity will decrease Outcome: Not Progressing   Problem: Tissue Perfusion: Goal: Adequacy of tissue perfusion will improve Outcome: Not Progressing

## 2023-06-19 ENCOUNTER — Other Ambulatory Visit (HOSPITAL_COMMUNITY): Payer: Self-pay

## 2023-06-19 ENCOUNTER — Telehealth (HOSPITAL_COMMUNITY): Payer: Self-pay | Admitting: Pharmacy Technician

## 2023-06-19 DIAGNOSIS — I342 Nonrheumatic mitral (valve) stenosis: Secondary | ICD-10-CM | POA: Diagnosis not present

## 2023-06-19 DIAGNOSIS — E440 Moderate protein-calorie malnutrition: Secondary | ICD-10-CM | POA: Insufficient documentation

## 2023-06-19 DIAGNOSIS — I272 Pulmonary hypertension, unspecified: Secondary | ICD-10-CM | POA: Diagnosis not present

## 2023-06-19 DIAGNOSIS — I480 Paroxysmal atrial fibrillation: Secondary | ICD-10-CM

## 2023-06-19 DIAGNOSIS — I5033 Acute on chronic diastolic (congestive) heart failure: Secondary | ICD-10-CM | POA: Diagnosis not present

## 2023-06-19 LAB — GLUCOSE, CAPILLARY
Glucose-Capillary: 137 mg/dL — ABNORMAL HIGH (ref 70–99)
Glucose-Capillary: 176 mg/dL — ABNORMAL HIGH (ref 70–99)
Glucose-Capillary: 195 mg/dL — ABNORMAL HIGH (ref 70–99)
Glucose-Capillary: 157 mg/dL — ABNORMAL HIGH (ref 70–99)

## 2023-06-19 LAB — CBC
HCT: 38.2 % (ref 36.0–46.0)
Hemoglobin: 10.6 g/dL — ABNORMAL LOW (ref 12.0–15.0)
MCH: 24.1 pg — ABNORMAL LOW (ref 26.0–34.0)
MCHC: 27.7 g/dL — ABNORMAL LOW (ref 30.0–36.0)
MCV: 86.8 fL (ref 80.0–100.0)
Platelets: 270 10*3/uL (ref 150–400)
RBC: 4.4 MIL/uL (ref 3.87–5.11)
RDW: 28.9 % — ABNORMAL HIGH (ref 11.5–15.5)
WBC: 7.2 10*3/uL (ref 4.0–10.5)
nRBC: 0 % (ref 0.0–0.2)

## 2023-06-19 LAB — BASIC METABOLIC PANEL WITH GFR
Anion gap: 6 (ref 5–15)
BUN: 10 mg/dL (ref 8–23)
CO2: 30 mmol/L (ref 22–32)
Calcium: 8.5 mg/dL — ABNORMAL LOW (ref 8.9–10.3)
Chloride: 106 mmol/L (ref 98–111)
Creatinine, Ser: 0.76 mg/dL (ref 0.44–1.00)
GFR, Estimated: >60 mL/min (ref >60)
Glucose, Bld: 127 mg/dL — ABNORMAL HIGH (ref 70–99)
Potassium: 3.6 mmol/L (ref 3.5–5.1)
Sodium: 142 mmol/L (ref 135–145)

## 2023-06-19 LAB — PROTIME-INR
INR: 1.9 — ABNORMAL HIGH (ref 0.8–1.2)
Prothrombin Time: 22.4 s — ABNORMAL HIGH (ref 11.4–15.2)

## 2023-06-19 MED ORDER — POLYETHYLENE GLYCOL 3350 17 G PO PACK
17.0000 g | PACK | Freq: Every day | ORAL | Status: DC | PRN
  Administered 2023-06-30: 17 g via ORAL
  Filled 2023-06-19: qty 1

## 2023-06-19 MED ORDER — WARFARIN SODIUM 2.5 MG PO TABS
2.5000 mg | ORAL_TABLET | Freq: Once | ORAL | Status: AC
  Administered 2023-06-19: 2.5 mg via ORAL
  Filled 2023-06-19: qty 1

## 2023-06-19 MED ORDER — HYDRALAZINE HCL 20 MG/ML IJ SOLN
5.0000 mg | Freq: Once | INTRAMUSCULAR | Status: AC | PRN
  Administered 2023-06-19: 5 mg via INTRAVENOUS
  Filled 2023-06-19: qty 1

## 2023-06-19 MED ORDER — ENSURE MAX PROTEIN PO LIQD
11.0000 [oz_av] | Freq: Two times a day (BID) | ORAL | Status: DC
  Administered 2023-06-19 – 2023-06-20 (×4): 11 [oz_av] via ORAL
  Administered 2023-06-21: 237 mL via ORAL
  Administered 2023-06-21 – 2023-06-25 (×8): 11 [oz_av] via ORAL
  Filled 2023-06-19 (×13): qty 330

## 2023-06-19 MED ORDER — ADULT MULTIVITAMIN W/MINERALS CH
1.0000 | ORAL_TABLET | Freq: Every day | ORAL | Status: DC
  Administered 2023-06-19 – 2023-07-01 (×13): 1 via ORAL
  Filled 2023-06-19 (×13): qty 1

## 2023-06-19 MED ORDER — ALPRAZOLAM 0.5 MG PO TABS
0.5000 mg | ORAL_TABLET | Freq: Every day | ORAL | Status: DC
  Administered 2023-06-19 – 2023-06-30 (×12): 0.5 mg via ORAL
  Filled 2023-06-19 (×12): qty 1

## 2023-06-19 MED ORDER — FUROSEMIDE 10 MG/ML IJ SOLN
80.0000 mg | Freq: Two times a day (BID) | INTRAMUSCULAR | Status: AC
  Administered 2023-06-19: 80 mg via INTRAVENOUS
  Filled 2023-06-19: qty 8

## 2023-06-19 MED ORDER — POTASSIUM CHLORIDE CRYS ER 20 MEQ PO TBCR
40.0000 meq | EXTENDED_RELEASE_TABLET | Freq: Once | ORAL | Status: AC
  Administered 2023-06-19: 40 meq via ORAL
  Filled 2023-06-19: qty 2

## 2023-06-19 MED ORDER — ONDANSETRON HCL 4 MG/2ML IJ SOLN
4.0000 mg | Freq: Four times a day (QID) | INTRAMUSCULAR | Status: DC | PRN
  Administered 2023-06-19 – 2023-06-25 (×3): 4 mg via INTRAVENOUS
  Filled 2023-06-19 (×4): qty 2

## 2023-06-19 MED ORDER — KETOROLAC TROMETHAMINE 15 MG/ML IJ SOLN
15.0000 mg | Freq: Once | INTRAMUSCULAR | Status: AC
  Administered 2023-06-19: 15 mg via INTRAVENOUS
  Filled 2023-06-19: qty 1

## 2023-06-19 NOTE — Discharge Instructions (Signed)

## 2023-06-19 NOTE — Consult Note (Addendum)
 Cardiology Consultation   Patient ID: Safia Drye MRN: [id number]; DOB: Oct 26, 1950  Admit date: 06/18/2023 Date of Consult: 06/19/2023  PCP:  Sherol Dixie, MD   [facility] Providers Cardiologist:  None  Advanced Heart Failure:  Peder Bourdon, MD  {    Patient Profile:   Candie Busam is a 73 y.o. female with a hx of oxygen  dependent COPD, mitral stenosis s/p bioprosthetic MV replacement, R carotid artery stenosis s/p endarterectomy 2012, paroxysmal atrial fibrillation, GI bleeding due to AVM, pulmonary hypertension/RV failure, type 2 DM, PAD, renal cell cancer s/p left kidney cryoablation, tobacco use,  who is being seen 06/19/2023 for the evaluation of acute CHF at the request of Dr. Jarvis Mesa.  History of Present Illness:   Ms. Cardin with above PMH who presented to ER  for bilateral lower extremity edema and SOB with exertion.   Per chart review, she follows Dr Mitzie Anda for pulmonary hypertension/RV failure until 08/2021. She was initially admitted 04/2016 for NSTEMI.  LHC showed minimal CAD with 30%LM, 40% pRCA. RHC showed elevated right and left heart filling pressures. Moderate to severe pulmonary hypertension. V/Q scan showed no chronic PE. PFTs showed severe obstructive lung disease. Echo showed LVEF 60-65%,  severe RV dilation with mildly decreased systolic function, PASP 76 mmHg. Bioprosthetic MV functioning normal.   Repeat Echo 06/2017 showed EF 60-65%, mildly decreased RV systolic function, PASP 49 mmHg, normal bioprosthetic mitral valve.   PYP scan (10/21) with grade 1, H/CL 1.04, not suggestive of TTR amyloidosis.   Echo (12/21): EF 60-65%, mild LVH, normal RV, PASP 35 mmHg, s/p bioprosthetic mitral valve with mean gradient 4, IVC normal   She last followed up with AHF clinic 08/23/21 , felt pulmonary HTN is primarily group 2 (LV diastolic failure) and group 3 (severe COPD and OSA). She was felt unlikely to benefit from selective pulmonary  vasodilators. She has been managed with loop diuretic and oxygen /CPAP support. She stopped Jardiance  with a severe yeast infection and declined for re-try. She was suppose to take Lasix  80AM and 40mg  PM but frequently skips afternoon dose in the past.   She also has known paroxysmal A fib. She suffered GI bleed due to AVMs in 2019 requiring blood transfusion, off coumadin  for some time time. Coumadin  was later resumed with goal of INR 1.5-2.5. NOAC was offered in the past given bioprosthetic valve, but she preferred coumadin .   She does have PAD with abnormal ABIs bilaterally, follows Dr [name] Cookey, attempted Pletal  in the past which did not help symptoms. She had been recommended smoking cessation and statin in the past.   She was most recently hospitalized here 05/20/23 to 05/23/23 for HTN urgency, chest pain, and weakness. She required nitroglycerin  gtt for HTN, amlodipine  was switched to losartan  for HTN. Hs trop was negative. Echo was done 05/21/23 showed LVEF 60-65%, no RWMA, mild LVH, moderately reduced RV and enlarged RV, severely elevated PASP, severe LAE, moderate mitral stenosis with bioprosthetic in position, MV peak gradient 19.1 mmHg and mean gradient , moderate AI, mild AS with VTI 1.71 cm2, mean gradient 10.3 Mmhg, Vmax 2.34 m/s. She was felt stable from COPD and CHF standpoint based on discharge summary on 05/23/23.   She visited internal medicine yesterday, reports doing poorly since her April discharge and sent to ER. Upon encounter, she c/o > 1 month duration of BLE edema with pain. She noted her hands sometimes swells as well. She felt she has been gaining weight at least >10  pounds over the past month. She c/o chronic chest pain, mostly associate with air hunger sensation.  She believes she is taking Lasix  20mg  daily. She has not been able to eat or drink well lately due to feeling poor. She felt she is always SOB with exertion and this seems to be worse as well. She endorses orthopnea.  She had increased her lasix  to 40mg  daily for the past week and felt this help her hand swelling.   She admits eating a diet that includes frozen/canned goods, packaged meat, and grits. She states she is quite weak overall with ambulation. She lives alone and wants to go to rehab. She continue smokes 1PPD. She uses 2.5LNC at night for her COPD and OSA. She has not been using her CPAP for a while due to sinus problems. She felt no significant improvement of her symptoms so far with IV Lasix , but states she has urinated a lot.   ER workup from 06/18/23 showed unremarkable BMP. CBC with Hgb 11.3. BNP 993. Hs trop 16. INR 1.8. CXR showed no evidence of acute cardiopulmonary process. Chronic interstitial prominence and scattered pulmonary scarring. She was felt volume overloaded and started on IV Lasix . She was admitted to teaching service. Cardiology is consulted today for CHF.      Past Medical History:  Diagnosis Date   Abnormality of lung on CXR 02/02/2020   Nonspecific finding on CXR ordered by pulmonologist - c/w inflammation vs infection, f/u imaging suggested, Dr. Antonette Batters office has been in communication about recommended next steps.   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   Non-bleeding when visualized on capsule endoscopy 06/30/2015    Arthritis    "lower back; hands" (02/19/2018)   Asthma    Asymptomatic cholelithiasis 09/25/2015   Seen on CT scan 08/2015   Carotid artery stenosis; s/p R endarterectomy    s/p right endarterectomy (06/2010) Carotid US  (07/2010):  Left: Moderate-to-severe (60-79%) calcific and non-calcific plaque origin and proximal ICA and ECA    Chronic congestive heart failure with left ventricular diastolic dysfunction (HCC) 10/21/2010   Chronic constipation 02/03/2011   Chronic daily headache 01/16/2014   Chronic iron  deficiency anemia     Chronic low back pain 10/06/2012   Chronic venous insufficiency 08/04/2012   Closed fracture of one rib of left side 08/23/2021   ED visit after a fall 08/16/21    COPD (chronic obstructive pulmonary disease) with emphysema (HCC)    PFTs 2018: severe obstructive disease, insignif response to bronchiodilator, mild restriction parenchymal pattern, moderately severe diffusion defect. 2014  FEV1 0.92 (40%), ratio 69, 27% increase in FEV1 with BD, TLC 91%, severe airtrapping, DLCO49% On chronic home O2. Pulmonary rehab referral 05/2012    Dyspnea    Fibromyalgia 08/29/2010   Gastroesophageal reflux disease    History of blood transfusion    "several times"  (02/19/2018)   History of clear cell renal cell carcinoma (HCC), in remission 07/21/2011   s/p cryoablation of left RCC in 09/2011 by Dr. Nereida Banning. Followed by Dr. Joie Narrow  [facility]) .     History of fracture of left hip 10/17/2022   History of hiatal hernia    History of mitral valve replacement with bioprosthetic valve due to mitral stenosis 2012   s/p MVR with a 27-mm pericardial porcine valve (Medtronic Mosaic valve, serial #[id number] on 09/20/10, Dr. Matt Song)    History of obstructive  sleep apnea, resolved 2013   resolved per sleep study 07/2019; no apnea, but did have desaturation.  CPAP no longer necessary.  Nocturnal polysomnography (06/2009): Moderate sleep apnea/ hypopnea syndrome , AHI 17.8 per hour with nonpositional hypopneas. CPAP titration to 12 CWP, AHI 2.4 per hour. On nocturnal CPAP via a small resMed Quattro full-face mask with heated humidifier.    History of pneumonia    "once"  (02/19/2018)   History of seborrheic keratosis 09/28/2015   History of stroke without residual deficits 01/17/2022   Hyperlipidemia LDL goal < 100 11/20/2005   Internal and external hemorrhoids without complication 08/04/2012   Lesion of left native kidney 06/01/2020   Incidental finding on recent screening chest CT for lung ca ordered by  Dr. Linder Revere pulmonology - he has ordered a f/u renal U/S.    Lichen sclerosus of female genitalia 01/12/2017   Migraine    "none in years" (02/19/2018)   Mild cognitive impairment with memory loss 12/23/2021   Moderately severe major depression (HCC) 11/19/2005   Nocturnal hypoxia per sleep study 07/2019    Osteoporosis    DEXA 2016: T -2.7; DEXA (12/09/2011): L-spine T -3.7, left hip T -1.4 DEXA (12/2004): L-spine T -2.6, left hip -0.1    Paroxysmal atrial fibrillation (HCC) 10/22/2010   s/p Left atrial maze procedure for paroxysmal atrial fibrillation on 09/20/2010 by Dr Matt Song.  Subsequent splenic infarct, decision was made to re-anticoagulate with coumadin , likely life-long as this is the most likely cause of the splenic infarct.    Personal history of colonic polyps 05/14/2011   Colonoscopy (05/2011): 4 mm adenomatous polyp excised endoscopically Colonoscopy (02/2002): Adenomatous polyp excised endoscopically    Personal history of renal cell carcinoma 09/12/2020   Pneumonia    Pulmonary hypertension due to chronic obstructive pulmonary disease (HCC) 04/25/2016   2014 TEE w PA peak pressure 46 mmHg, s/p MV replacement    Right nephrolithiasis, asymptomatic, incidental finding 09/06/2014   5 mm non-obstructing calculus seen on CT scan 09/05/2014    Right ventricular failure (HCC) 04/25/2016   Severe obesity (BMI 35.0-39.9) with comorbidity (HCC) 10/23/2011   Sleep apnea    Stage 2 chronic kidney disease due to type 2 diabetes mellitus (HCC) 12/16/2018   CKD stage III     Tobacco abuse 07/28/2012   Type 2 diabetes mellitus with complication, with long-term current use of insulin  (HCC)    Type 2 diabetes mellitus with diabetic neuropathy (HCC)    Weight loss due to medication 03/12/2023    Past Surgical History:  Procedure Laterality Date   BIOPSY  07/19/2021   Procedure: BIOPSY;  Surgeon: Ozell Blunt, MD;  Location: [facility] ENDOSCOPY;  Service: Gastroenterology;;   CARDIAC CATHETERIZATION      CARDIAC VALVE REPLACEMENT  Aug. 2012   "mitral valve"   CAROTID ENDARTERECTOMY Right 07/04/2010   by Dr. [name] Cookey for asymptomatic right carotid artery stenosis   CATARACT EXTRACTION W/ INTRAOCULAR LENS  IMPLANT, BILATERAL Bilateral    CHEST TUBE INSERTION  09/24/2010   Dr Carloyn Chi Trigt   COLONOSCOPY  05/12/2011   performed by Dr. Honey Lusty. Showing small internal hemorrhoids, single tubular adenoma polyp   COLONOSCOPY WITH PROPOFOL  N/A 05/29/2020   Procedure: COLONOSCOPY WITH PROPOFOL ;  Surgeon: Ozell Blunt, MD;  Location: [facility] ENDOSCOPY;  Service: Endoscopy;  Laterality: N/A;   COLONOSCOPY WITH PROPOFOL  N/A 07/19/2021   Procedure: COLONOSCOPY WITH PROPOFOL ;  Surgeon: Ozell Blunt, MD;  Location: [facility];  Service: Gastroenterology;  Laterality: N/A;   CRYOABLATION Left  09/2011   by Dr. Nereida Banning. Followed by Dr. Joie Narrow  [facility]) .     DILATION AND CURETTAGE OF UTERUS     ESOPHAGOGASTRODUODENOSCOPY  05/12/2011   performed by Dr. Honey Lusty. Negative for ulcerations, biopsy negative for evidence of celiac sprue   ESOPHAGOGASTRODUODENOSCOPY N/A 06/29/2015   Procedure: ESOPHAGOGASTRODUODENOSCOPY (EGD);  Surgeon: Ozell Blunt, MD;  Location: [facility] ENDOSCOPY;  Service: Endoscopy;  Laterality: N/A;   ESOPHAGOGASTRODUODENOSCOPY N/A 03/29/2016   Procedure: ESOPHAGOGASTRODUODENOSCOPY (EGD);  Surgeon: Ozell Blunt, MD;  Location[facility] ENDOSCOPY;  Service: Endoscopy;  Laterality: N/A;   ESOPHAGOGASTRODUODENOSCOPY (EGD) WITH PROPOFOL  N/A 05/29/2020   Procedure: ESOPHAGOGASTRODUODENOSCOPY (EGD) WITH PROPOFOL ;  Surgeon: Ozell Blunt, MD;  Location: [facility];  Service: Endoscopy;  Laterality: N/A;   FRACTURE SURGERY     GIVENS CAPSULE STUDY N/A 06/30/2015   Procedure: GIVENS CAPSULE STUDY;  Surgeon: Ozell Blunt, MD;  Location: [facility] ENDOSCOPY;  Service: Endoscopy;  Laterality: N/A;   GIVENS CAPSULE STUDY N/A 06/29/2015   Procedure: GIVENS CAPSULE STUDY;  Surgeon: Ozell Blunt, MD;  Location: [facility] ENDOSCOPY;  Service:  Endoscopy;  Laterality: N/A;   HEMORRHOID SURGERY  1970s?   "lanced"   HYSTEROSCOPY W/ ENDOMETRIAL ABLATION  06/2001   for persistent post-menopausal bleeding // by S. Saint Cranker, M.D.   INTRAMEDULLARY (IM) NAIL INTERTROCHANTERIC Left 10/19/2022   Procedure: LEFT INTRAMEDULLARY (IM) NAIL INTERTROCHANTERIC;  Surgeon: Gaylon Kea, MD;  Location: [facility] OR;  Service: Orthopedics;  Laterality: Left;   IR GENERIC HISTORICAL  08/23/2015   IR RADIOLOGIST EVAL & MGMT 08/23/2015 Erica Hau, MD [facility] INTERV RAD   IR GENERIC HISTORICAL  04/09/2016   IR RADIOLOGIST EVAL & MGMT 04/09/2016 Erica Hau, MD [facility] INTERV RAD   IR RADIOLOGIST EVAL & MGMT  10/07/2016   IR RADIOLOGIST EVAL & MGMT  06/25/2017   IR RADIOLOGIST EVAL & MGMT  07/31/2020   IR RADIOLOGIST EVAL & MGMT  10/02/2020   IR RADIOLOGIST EVAL & MGMT  12/20/2020   IR RADIOLOGIST EVAL & MGMT  12/25/2021   IR RADIOLOGIST EVAL & MGMT  04/16/2023   LEFT HEART CATH AND CORONARY ANGIOGRAPHY N/A 04/21/2016   Procedure: Left Heart Cath and Coronary Angiography;  Surgeon: Avanell Leigh, MD;  Location: [facility] INVASIVE CV LAB;  Service: Cardiovascular;  Laterality: N/A;   LIPOMA EXCISION  08/2005   occipital lipoma 1.5cm - by Dr. Corliss Dies   LITHOTRIPSY  ~ 2000   MAZE Left 09/20/10   for paroxysmal atrial fibrillation (Dr. Matt Song)   MITRAL VALVE REPLACEMENT  09/20/10    with a 27-mm pericardial porcine valve (Medtronic Mosaic valve, serial #[id number]). 09/20/10, Dr Matt Song   ORIF CLAVICLE FRACTURE Right 01/2004   by Myrtle Atta. Murrel Arnt, M.D for Right clavicle nonunion.; "it's got a pin in it"   POLYPECTOMY  05/29/2020   Procedure: POLYPECTOMY;  Surgeon: Ozell Blunt, MD;  Location: [facility] ENDOSCOPY;  Service: Endoscopy;;   POLYPECTOMY  07/19/2021   Procedure: POLYPECTOMY;  Surgeon: Ozell Blunt, MD;  Location: [facility] ENDOSCOPY;  Service: Gastroenterology;;   RADIOLOGY WITH ANESTHESIA Left 09/12/2020   Procedure: RADIOLOGY WITH ANESTHESIA- RENAL CRYOABLATION;  Surgeon:  Erica Hau, MD;  Location: [facility] ORS;  Service: Radiology;  Laterality: Left;   REFRACTIVE SURGERY Bilateral    RIGHT HEART CATH N/A 04/23/2016   Procedure: Right Heart Cath;  Surgeon: Darlis Eisenmenger, MD;  Location: [facility] INVASIVE CV LAB;  Service: Cardiovascular;  Laterality: N/A;   TONSILLECTOMY     TUBAL LIGATION  Home Medications:  Prior to Admission medications   Medication Sig Start Date End Date Taking? Authorizing Provider  albuterol  (VENTOLIN  HFA) 108 (90 Base) MCG/ACT inhaler Inhale 2 puffs into the lungs every 6 hours as needed for shortness of breath 01/21/23  Yes Sherol Dixie, MD  ALPRAZolam  (XANAX ) 0.5 MG tablet Take 1 tablet (0.5 mg total) by mouth at bedtime. To help with sleep. 06/05/23  Yes Sherol Dixie, MD  buPROPion  (WELLBUTRIN  XL) 150 MG 24 hr tablet Take 1 tablet (150 mg total) by mouth every morning. 12/24/22  Yes Sherol Dixie, MD  DULoxetine  (CYMBALTA ) 60 MG capsule Take 60 mg by mouth 2 (two) times daily.   Yes [provider]  Fluticasone -Umeclidin-Vilant (TRELEGY ELLIPTA ) 100-62.5-25 MCG/ACT AEPB Inhale 1 puff into the lungs daily. 03/22/23  Yes Lind Repine, MD  furosemide  (LASIX ) 40 MG tablet Take 0.5 tablets (20 mg total) by mouth daily. Patient taking differently: Take 40 mg by mouth daily. 04/07/23  Yes Sherol Dixie, MD  gabapentin  (NEURONTIN ) 600 MG tablet Take 1 tablet (600 mg total) by mouth at bedtime. 01/06/23  Yes Sherol Dixie, MD  HYDROcodone -acetaminophen  (NORCO/VICODIN) 5-325 MG tablet Take 2 tablets by mouth 2 (two) times daily. 06/05/23  Yes Sherol Dixie, MD  insulin  degludec (TRESIBA ) 100 UNIT/ML FlexTouch Pen Inject 30 Units into the skin every morning. 04/30/23  Yes Sherol Dixie, MD  ipratropium (ATROVENT ) 0.03 % nasal spray Instill 2 sprays into the nose 3 (three) times daily as directed 02/18/23  Yes Sherol Dixie, MD  ipratropium-albuterol  (DUONEB) 0.5-2.5 (3) MG/3ML SOLN  Inhale 3 mLs by nebulization every 6 (six) hours as needed (shortness of breath, wheezing). 04/03/22  Yes Sherol Dixie, MD  linaclotide  (LINZESS ) 72 MCG capsule Take 1 capsule (72 mcg total) by mouth daily before breakfast. 12/15/22 12/10/23 Yes Sherol Dixie, MD  losartan  (COZAAR ) 50 MG tablet Take 1 tablet (50 mg total) by mouth daily. 05/23/23  Yes Maxie Spaniel, MD  metoprolol  succinate (TOPROL  XL) 25 MG 24 hr tablet Take 1 tablet (25 mg total) by mouth daily. 04/07/23  Yes Sherol Dixie, MD  omeprazole  (PRILOSEC) 40 MG capsule Take 1 capsule (40 mg total) by mouth 2 (two) times daily. 01/29/23  Yes Sherol Dixie, MD  ondansetron  (ZOFRAN ) 4 MG tablet Take 1 tablet (4 mg total) by mouth daily as needed for nausea or vomiting. 05/26/23 05/25/24 Yes Sherol Dixie, MD  OXYGEN  Inhale 2 L into the lungs at bedtime.   Yes [provider]  potassium chloride  (KLOR-CON  M) 10 MEQ tablet Take 1 tablet (10 mEq total) by mouth daily. 03/12/23  Yes Sherol Dixie, MD  ramelteon  (ROZEREM ) 8 MG tablet Take 1 tablet (8 mg total) by mouth at bedtime. 06/10/23 06/09/24 Yes Sherol Dixie, MD  venlafaxine  XR (EFFEXOR -XR) 150 MG 24 hr capsule Take 1 capsule (150 mg total) by mouth daily with breakfast. 04/07/23  Yes Sherol Dixie, MD  warfarin (COUMADIN ) 2.5 MG tablet Take 1 tablet (2.5 mg total) by mouth daily unless otherwise instructed 12/23/22  Yes Sherol Dixie, MD  Continuous Glucose Sensor (FREESTYLE LIBRE 3 PLUS SENSOR) MISC Change sensor every 15 days. 04/29/23   Sherol Dixie, MD  Insulin  Pen Needle 32G X 4 MM MISC Use to inject insulin  daily 05/18/23   Plyler, Melodie Spry, RD  ketoconazole  (NIZORAL ) 2 % cream Apply 1 application topically at bedtime as needed for irritation. Patient not taking:  Reported on 06/18/2023 09/08/22   Kenda Paula, RPH-CPP  predniSONE  (DELTASONE ) 20 MG tablet Take 2 tablets (40 mg total) by mouth daily. Patient not  taking: Reported on 05/20/2023 05/15/23   Horton, Vonzella Guernsey, MD  senna-docusate (SENOKOT-S) 8.6-50 MG tablet Take 1 tablet by mouth daily after supper. Patient not taking: Reported on 06/18/2023 05/23/23   Maxie Spaniel, MD    Inpatient Medications: Scheduled Meds:  ALPRAZolam   0.5 mg Oral QHS   budeson-glycopyrrolate -formoterol   2 puff Inhalation BID   buPROPion   150 mg Oral BH-q7a   gabapentin   600 mg Oral QHS   insulin  aspart  0-15 Units Subcutaneous TID WC   insulin  glargine-yfgn  30 Units Subcutaneous Daily   ipratropium-albuterol   3 mL Nebulization Q6H   linaclotide   72 mcg Oral QAC breakfast   losartan   50 mg Oral Daily   metoprolol  succinate  25 mg Oral Daily   multivitamin with minerals  1 tablet Oral Daily   nicotine   14 mg Transdermal Daily   pantoprazole   40 mg Oral Daily   Ensure Max Protein  11 oz Oral BID   ramelteon   8 mg Oral QHS   venlafaxine  XR  150 mg Oral Q breakfast   Warfarin - Pharmacist Dosing Inpatient   Does not apply q1600   Continuous Infusions:  PRN Meds: HYDROcodone -acetaminophen , ondansetron  (ZOFRAN ) IV, polyethylene glycol  Allergies:    Allergies  Allergen Reactions   Dexilant [Dexlansoprazole] Other (See Comments)    Made acid reflux worse   Metoclopramide      Other Reaction(s): unknown   Isovue  [Iopamidol ] Itching and Rash   Lorazepam Other (See Comments)    Patient's sister noted that ativan caused the patient to become extremely confused during hospitalization 09/2010; tolerates Xanax    Oxycontin  [Oxycodone ] Other (See Comments)    headache   Tramadol Hcl Swelling    Ankle swelling    Social History:   Social History   Socioeconomic History   Marital status: Divorced    Spouse name: Not on file   Number of children: Not on file   Years of education: Not on file   Highest education level: Not on file  Occupational History   Not on file  Tobacco Use   Smoking status: Every Day    Current packs/day: 1.00    Average packs/day: 1  pack/day for 60.6 years (60.6 ttl pk-yrs)    Types: Cigarettes    Start date: 11/01/1962   Smokeless tobacco: Never   Tobacco comments:    At least 1 PPD  Vaping Use   Vaping status: Never Used  Substance and Sexual Activity   Alcohol use: Not Currently    Comment: 02/19/2018 "nothing since 1999"   Drug use: Not Currently    Types: Marijuana    Comment: 02/19/2018 "nothing since the 1990s"   Sexual activity: Not Currently    Birth control/protection: Post-menopausal  Other Topics Concern   Not on file  Social History Narrative   Lives alone in [place] ([address])   Worked at [employer] for 18 years   No car   Social Drivers of Corporate investment banker Strain: Low Risk  (04/08/2023)   Overall Financial Resource Strain (CARDIA)    Difficulty of Paying Living Expenses: Not hard at all  Food Insecurity: No Food Insecurity (06/18/2023)   Hunger Vital Sign    Worried About Running Out of Food in the Last Year: Never true    Ran Out of Food  in the Last Year: Never true  Recent Concern: Food Insecurity - Food Insecurity Present (05/25/2023)   Hunger Vital Sign    Worried About Running Out of Food in the Last Year: Never true    Ran Out of Food in the Last Year: Sometimes true  Transportation Needs: No Transportation Needs (06/18/2023)   PRAPARE - Administrator, Civil Service (Medical): No    Lack of Transportation (Non-Medical): No  Recent Concern: Transportation Needs - Unmet Transportation Needs (04/08/2023)   PRAPARE - Transportation    Lack of Transportation (Medical): Yes    Lack of Transportation (Non-Medical): Yes  Physical Activity: Inactive (04/08/2023)   Exercise Vital Sign    Days of Exercise per Week: 0 days    Minutes of Exercise per Session: 0 min  Stress: Stress Concern Present (04/08/2023)   [employer] of Occupational Health - Occupational Stress Questionnaire    Feeling of Stress : Rather much  Social Connections: Socially Isolated  (06/18/2023)   Social Connection and Isolation Panel [NHANES]    Frequency of Communication with Friends and Family: More than three times a week    Frequency of Social Gatherings with Friends and Family: More than three times a week    Attends Religious Services: Never    Database administrator or Organizations: No    Attends Banker Meetings: Never    Marital Status: Divorced  Catering manager Violence: Not At Risk (06/18/2023)   Humiliation, Afraid, Rape, and Kick questionnaire    Fear of Current or Ex-Partner: No    Emotionally Abused: No    Physically Abused: No    Sexually Abused: No    Family History:    Family History  Problem Relation Age of Onset   Peptic Ulcer Disease Father    Heart attack Father 77       Died of MI at age 18   Heart attack Brother 61       Died of MI at age 74   Obesity Brother    Pneumonia Mother    Healthy Sister    Lupus Daughter    Obsessive Compulsive Disorder Daughter      ROS:  Constitutional: Denied fever, chills, malaise, night sweats Eyes: Denied vision change or loss Ears/Nose/Mouth/Throat: Denied ear ache, sore throat, coughing, sinus pain Cardiovascular: see HPI  Respiratory:see HPI  Gastrointestinal: Denied nausea, vomiting, abdominal pain, diarrhea Genital/Urinary: Denied dysuria, hematuria, urinary frequency/urgency Musculoskeletal: global weakness  Skin: Denied rash, wound Neuro: Denied headache, dizziness, syncope Psych: Denied history of depression/anxiety  Endocrine: history of diabetes    Physical Exam/Data:   Vitals:   06/19/23 0852 06/19/23 1143 06/19/23 1349 06/19/23 1725  BP:  135/87  (!) 160/92  Pulse: 78 71 74 64  Resp:  20  20  Temp:  98.3 F (36.8 C)  98.3 F (36.8 C)  TempSrc:  Oral    SpO2: 98% 90% 94% 91%  Weight:      Height:        Intake/Output Summary (Last 24 hours) at 06/19/2023 1758 Last data filed at 06/19/2023 1100 Gross per 24 hour  Intake 1680 ml  Output 1250 ml  Net 430 ml       06/19/2023    5:37 AM 06/18/2023    4:42 PM 06/18/2023   12:03 PM  Last 3 Weights  Weight (lbs) 142 lb 12.8 oz 143 lb 4.8 oz 140 lb  Weight (kg) 64.774 kg 65 kg 63.504 kg  Body mass index is 26.12 kg/m.   Vitals:  Vitals:   06/19/23 1349 06/19/23 1725  BP:  (!) 160/92  Pulse: 74 64  Resp:  20  Temp:  98.3 F (36.8 C)  SpO2: 94% 91%   General Appearance: In no apparent distress, sitting in bed  HEENT: Normocephalic, atraumatic. Neck: Supple, trachea midline, elevated JVDs Cardiovascular: Irregular,  normal S1-S2,  soft diastolic murmur noted  Respiratory: Resting breathing unlabored, lungs sounds with crackles bilaterally, on room air, speaks full sentence.  Gastrointestinal: Bowel sounds positive, abdomen soft Extremities: Able to move all extremities in bed without difficulty, BLE 2+ pitting edema  Musculoskeletal: Normal muscle bulk and tone Skin: Intact, warm, dry. No rashes  Neurologic: Alert, oriented to person, place and time. Fluent speech,no cognitive deficit, no pronator drift, no gross focal neuro deficit Psychiatric: Normal affect. Mood is appropriate.     EKG:  The EKG was personally reviewed and demonstrates:    EKG from  06/18/23 at 11:36 showed sinus rhythm 79bpm, first degree AVB, PAC, non-specific T wave abnormalities   EKG from 06/18/23 at 11:49 showed sinus rhythm 83 bpm, first degree AVB, artifacts, non-specific T wave abnormalities   Telemetry:  Telemetry was personally reviewed and demonstrates:    Sinus rhythm 60s   Relevant CV Studies:   Echo from 05/21/23:    1. Left ventricular ejection fraction, by estimation, is 60 to 65%. The  left ventricle has normal function. The left ventricle has no regional  wall motion abnormalities. There is mild concentric left ventricular  hypertrophy. Left ventricular diastolic  function could not be evaluated.   2. Right ventricular systolic function is moderately reduced. The right  ventricular size is  moderately enlarged. There is severely elevated  pulmonary artery systolic pressure.   3. Left atrial size was severely dilated.   4. The mitral valve has been repaired/replaced. No evidence of mitral  valve regurgitation. Moderate mitral stenosis. There is a bioprosthetic  valve present in the mitral position.   5. The aortic valve is calcified. Aortic valve regurgitation is moderate.  Mild aortic valve stenosis. Aortic valve area, by VTI measures 1.71 cm.  Aortic valve mean gradient measures 10.3 mmHg. Aortic valve Vmax measures  2.34 m/s.   6. The inferior vena cava is normal in size with greater than 50%  respiratory variability, suggesting right atrial pressure of 3 mmHg.   Echo from 10/18/22:  1. Left ventricular ejection fraction, by estimation, is 65 to 70%. The  left ventricle has normal function. The left ventricle has no regional  wall motion abnormalities. Left ventricular diastolic parameters are  indeterminate.   2. Right ventricular systolic function is normal. The right ventricular  size is normal. Tricuspid regurgitation signal is inadequate for assessing  PA pressure.   3. Left atrial size was mildly dilated.   4. The mitral valve has been repaired/replaced. Trivial mitral valve  regurgitation. The mean mitral valve gradient is 7.0 mmHg with average  heart rate of 84 bpm. There is a bioprosthetic valve present in the mitral  position.   5. The aortic valve was not well visualized. Aortic valve regurgitation  is mild. Mild aortic valve stenosis.   6. The inferior vena cava is normal in size with greater than 50%  respiratory variability, suggesting right atrial pressure of 3 mmHg.   RHC 04/23/2016:  Right Heart Pressures RHC Procedural Findings: Hemodynamics (mmHg) RA mean 14 RV 77/16 PA 77/28, mean 47 PCWP mean 23  Oxygen  saturations:  PA 53% AO 93%  Cardiac Output (Fick) 4.86  Cardiac Index (Fick) 2.6 PVR 4.9 WU    LHC on 04/21/2016:  Ost RCA lesion,  40 %stenosed. Ost LM lesion, 30 %stenosed.  IMPRESSION:Ms Mcmichen has minimal CAD and normal LV function by 2-D echocardiogram. There was no "culprit lesion. I suspect this was a non-STEMI secondary to stress-induced septal subendocardial ischemia potentially from anemia. The sheath was removed and a TR band was placed on the right wrist which is patent hemostasis. The patient left the lab in stable condition. She will most likely be  discharged  in the morning.    Laboratory Data:  High Sensitivity Troponin:   Recent Labs  Lab 06/18/23 1223  TROPONINIHS 16     Chemistry Recent Labs  Lab 06/18/23 1223 06/18/23 1354 06/18/23 1606 06/19/23 0237  NA 142  --   --  142  K 3.8  --   --  3.6  CL 106  --   --  106  CO2 27  --   --  30  GLUCOSE 120*  --   --  127*  BUN 12  --   --  10  CREATININE 0.72  --  0.74 0.76  CALCIUM  8.8*  --   --  8.5*  MG  --  2.0  --   --   GFRNONAA >60  --  >60 >60  ANIONGAP 9  --   --  6    No results for input(s): "PROT", "ALBUMIN ", "AST", "ALT", "ALKPHOS", "BILITOT" in the last 168 hours. Lipids No results for input(s): "CHOL", "TRIG", "HDL", "LABVLDL", "LDLCALC", "CHOLHDL" in the last 168 hours.  Hematology Recent Labs  Lab 06/18/23 1223 06/18/23 1606 06/19/23 0237  WBC 8.1 7.3 7.2  RBC 4.70 4.77 4.40  HGB 11.3* 11.6* 10.6*  HCT 41.4 41.6 38.2  MCV 88.1 87.2 86.8  MCH 24.0* 24.3* 24.1*  MCHC 27.3* 27.9* 27.7*  RDW 28.7* 28.7* 28.9*  PLT 305 312 270   Thyroid  No results for input(s): "TSH", "FREET4" in the last 168 hours.  BNP Recent Labs  Lab 06/18/23 1223  BNP 993.8*    DDimer No results for input(s): "DDIMER" in the last 168 hours.   Radiology/Studies:  DG Chest 2 View Result Date: 06/18/2023 CLINICAL DATA:  Shortness of breath, cough. EXAM: CHEST - 2 VIEW COMPARISON:  Radiographs 05/20/2023, 05/15/2023 and 11/19/2022. CT 10/17/2022. FINDINGS: Stable mild cardiac enlargement post median sternotomy and mitral valve replacement.  There is aortic atherosclerosis. Chronic blunting of the left costophrenic sulcus. Mild chronic interstitial prominence and scattered pulmonary scarring. No edema, confluent airspace disease, pneumothorax or significant pleural effusion. The bones appear unchanged post right clavicular ORIF. Old rib fractures are noted on the right. No acute osseous findings are seen. IMPRESSION: No evidence of acute cardiopulmonary process. Chronic interstitial prominence and scattered pulmonary scarring. Electronically Signed   By: Elmon Hagedorn M.D.   On: 06/18/2023 12:33     Assessment and Plan:   Acute on chronic HFpEF RV failure  Pulmonary HTN group 2/3  - presented with >1 month BLE edema, DOE, orthopnea, global weakness; continue to smoke 1 PPD, consume diet high in Na, and non-compliant with CPAP lately  - Echo from 4/10 as above  - Felt no indication for pulmonary vasodilator by AHF team 2023  - on lasix  20mg  daily PTA and self increased to 40mg  daily for a week, she is clinically volume overloaded, s/p IV Lasix  80mg  x1, UOP not measured, will  increase IV Lasix  40mg  BID, please track intake and outpatient, daily weight  - Educated on Na limitation, importance of CPAP use, and smoking cessation  - GDMT: on PTA valsartan 50mg  and toprol  XL 25mg , can be continued; would involve pharmacy team to see the cost of Entresto and SGLT2i before discharge   Mitral stenosis s/p bioprosthetic valve replacement 2012 - Echo from 05/21/23 stable, not surgical candidate   Paroxysmal A fib  - in sinus rhythm currently - continue PTA  toprol  XL 25mg  and coumadin  (she preferred coumadin  over NOAC in the past)  COPD OSA Tobacco use PAD DM - per primary team       Risk Assessment/Risk Scores:    New York  Heart Association (NYHA) Functional Class NYHA Class IV  CHA2DS2-VASc Score = 5  This indicates a 7.2% annual risk of stroke. The patient's score is based upon: CHF History: 1 HTN History: 0 Diabetes  History: 1 Stroke History: 0 Vascular Disease History: 1 Age Score: 1 Gender Score: 1   For questions or updates, please contact [facility] Please consult [email or web address] for contact info under    Signed, Xika Zhao, NP  06/19/2023 5:58 PM  I have personally seen and examined this patient. I agree with the assessment and plan as outlined above.   73 yo female with history of severe COPD, Pulm HTN, mitral valve disease s/p MVR, carotid artery disease, PAF, GI bleeding, DM, PAD, ongoing tobacco abuse and mild CAD who has been followed in the past by our Advanced Heart Failure team (Dr. Mitzie Anda) and is now admitted with acute volume overload. Echo in April 2025 with normal LV function, moderate RV dysfunction and moderate mitral stenosis. She c/o dyspnea Labs reviewed by me. BNP is elevated Telemetry with sinus EKG with atrial fib My exam: NAD NG:EXBMW irreg with diastolic murmur. Lungs: basilar crackles  Ext: 1+ bil LE edema Plan: Acute on chronic diastolic CHF (HFpEF): Agree with IV Lasix  today. No good options for treatment of her Pulm HTN per previous notes of Dr. Mitzie Anda. She does have moderate MS but will follow this. She is not a candidate for surgery.  Antoinette Batman, MD, [facility] 06/19/2023  6:14 PM

## 2023-06-19 NOTE — Progress Notes (Addendum)
 PHARMACY - ANTICOAGULATION CONSULT NOTE  Pharmacy Consult for warfarin Indication: atrial fibrillation  Allergies  Allergen Reactions   Dexilant [Dexlansoprazole] Other (See Comments)    Made acid reflux worse   Metoclopramide      Other Reaction(s): unknown   Isovue  [Iopamidol ] Itching and Rash   Lorazepam Other (See Comments)    Patient's sister noted that ativan caused the patient to become extremely confused during hospitalization 09/2010; tolerates Xanax    Oxycontin  [Oxycodone ] Other (See Comments)    headache   Tramadol Hcl Swelling    Ankle swelling    Patient Measurements: Height: 5\' 2"  (157.5 cm) Weight: 64.8 kg (142 lb 12.8 oz) IBW/kg (Calculated) : 50.1 HEPARIN  DW (KG): 63.3  Vital Signs: Temp: 97.9 F (36.6 C) (05/09 0537) Temp Source: Oral (05/09 0537) BP: 211/70 (05/09 0537) Pulse Rate: 74 (05/09 0537)  Labs: Recent Labs    06/18/23 1223 06/18/23 1606 06/18/23 1735 06/19/23 0237  HGB 11.3* 11.6*  --  10.6*  HCT 41.4 41.6  --  38.2  PLT 305 312  --  270  LABPROT  --   --  21.5* 22.4*  INR  --   --  1.8* 1.9*  CREATININE 0.72 0.74  --  0.76  TROPONINIHS 16  --   --   --     Estimated Creatinine Clearance: 54.5 mL/min (by C-G formula based on SCr of 0.76 mg/dL).   Assessment: 73 YO female with PMH significant for R carotid artery stenosis s/p endarterectomy, bioMVR and atrial fibrillation on warfarin PTA who is admitted for eleavted BNP and BLE edema.  Pharmacy consulted to manage warfarin. INR 1.8 on admission, last warfarin dose 5/07 per patient.   PTA warfarin regimen: 1.25 mg Mon, 2.5 mg all other days per 5/6 anticoag visit (2.5 mg tablets) *5/6 INR = 3 on 3.75 mg Fri, 2.5 mg AOD (reduced to above regimen)  Notable DDI's: none  INR 1.9, subtherapeutic.  Hgb 10.6 (down), pltc 270 stable.  Per chart review, patient "resistant to a DOAC because she hasn't understood that these new medications are indicated as alternatives" back in 2012.  Will  perform benefits investigation and inquire about willingness to switch to DOAC.  Update - Eliquis/Xarelto both $0/month.  Goal of Therapy:  INR 2-2.5 Monitor platelets by anticoagulation protocol: Yes   Plan:  Give warfarin 2.5 mg PO x1 dose Stop lovenox  prophylaxis Check INR daily while on warfarin Continue to monitor H&H and platelets   Thank you for allowing pharmacy to be a part of this patient's care.  Cecillia Cogan, PharmD Clinical Pharmacist 06/19/2023  7:44 AM

## 2023-06-19 NOTE — Plan of Care (Signed)
  Problem: Education: Goal: Knowledge of General Education information will improve Description: Including pain rating scale, medication(s)/side effects and non-pharmacologic comfort measures Outcome: Progressing   Problem: Health Behavior/Discharge Planning: Goal: Ability to manage health-related needs will improve Outcome: Progressing   Problem: Clinical Measurements: Goal: Respiratory complications will improve Outcome: Progressing   Problem: Nutrition: Goal: Adequate nutrition will be maintained Outcome: Progressing   Problem: Elimination: Goal: Will not experience complications related to urinary retention Outcome: Progressing   Problem: Pain Managment: Goal: General experience of comfort will improve and/or be controlled Outcome: Progressing   Problem: Safety: Goal: Ability to remain free from injury will improve Outcome: Progressing   Problem: Skin Integrity: Goal: Risk for impaired skin integrity will decrease Outcome: Progressing   Problem: Coping: Goal: Level of anxiety will decrease Outcome: Not Progressing

## 2023-06-19 NOTE — Telephone Encounter (Signed)
 Patient Product/process development scientist completed.    The patient is insured through [employer]/ [employer]. Patient has Medicare and is not eligible for a copay card, but may be able to apply for patient assistance or Medicare RX Payment Plan (Patient Must reach out to their plan, if eligible for payment plan), if available.    Ran test claim for Eliquis 5 mg and the current 30 day co-pay is $0.00.  Ran test claim for Xarelto 20 mg and the current 30 day co-pay is $0.00.  This test claim was processed through [facility]- copay amounts may vary at other pharmacies due to pharmacy/plan contracts, or as the patient moves through the different stages of their insurance plan.     Morgan Arab, CPHT Pharmacy Technician III Certified Patient Advocate [facility] Patient Advocate Team Direct Number: [phone]  Fax: [phone]

## 2023-06-19 NOTE — Hospital Course (Addendum)
 [name] is a 73 y.o.female with pertinent PMH of HFpEF, R carotid artery stenosis s/p endarterectomy, MV stenosis s/p bioprosthetic valvular repair,  paroxysmal A-fib on warfarin, tobacco use disorder, T2DM, and COPD/emphysema on 2 L [place] at bedtime, who presented with bilateral lower extremity edema and admitted for HFpEF exacerbation.    Acute on chronic HFpEF Pulmonary hypertension Presented with increasing SOB, bilateral lower extremity edema, worsening orthopnea/PND.  BNP elevated at 994. Cardiology consulted for management given underlying MV stenosis/A-Fib.  Treated with IV diuretics with good response.  Aldactone  was added to home regimen.  Home furosemide  was increased from 20 to 40 mg daily. Home Losartan  was discontinued in exchange for Entresto . And given concomitant hypertension, Lopressor  was exchanged for Carvedilol . Symptoms markedly improved and she was euvolemic days leading up to discharge. New dry weight is ~ 130 pounds. Cardiology recommended [facility]  outpatient after repeat labs.   Hypertension Hypertensive to 190s day of admission which continued despite diuresis.  Lopressor  was discontinued and exchanged for carvedilol  which did improved blood pressure considerably. She had transient episode of bradycardia in 40's to 50's but was largely asymptomatic. Coreg  was decreased from 12.5 to 6.125 BID with good response.   Paroxysmal Atrial Fibrillation MV Stenosis s/p bioprosthetic repair Pharmacy managed warfarin dosing which was mildly subtherapeutic initially during admission thought secondary to vitamin K from Ensure shakes. INR goal was obtained in days leading up to discharge.      COPD Tobacco Use Disorder Managed on formulary equivalent of home Trelegy without complication. Provided Nicotine  patches and continually counseled on the importance of smoking cessation.   Deconditioning Mechanical Fall Worked with PT/OT who continually recommended SNF for further  rehabilitation where she will go upon discharge. Had mechanical fall 5/18 with complaints of right shoulder pain. Denies hitting head/LOC. X-ray of right shoulder negative. Possible rotator cuff pathology but deferred MRI for now as pain has been stable. She will continue rehab for this and overall deconditioning at [facility].

## 2023-06-19 NOTE — Progress Notes (Cosign Needed Addendum)
 HD#1 SUBJECTIVE:  Patient Summary: [name] is a 73 y.o. with a pertinent PMH of R carotid artery stenosis s/p endarterectomy, s/p MVR, HFpEF, pAF on warfarin, tobacco use disorder, T2DM, and COPD/emphysema on 2 L [place] at bedtime, who presented from the [facility] with bilateral lower extremity edema and admitted for HFpEF exacerbation.  Of note, the patient was admitted to the [facility] overnight. She had initially presented to the [facility] for a hospital follow up appointment yesterday and repotred not doing well since discharge, noting that she had worsening lower extreimty edema and pain. She is prescribed lasix  20 mg daily but notes that she doubled her dose to 40 mg daily for the last few days due to her swelling. The patient also endorsed shortness of breath, although this seems to be chronic and unchanged for her. She denied orthopnea or PND. She was sent to the ED from the [facility] due to concern for CHF exacerbation.   In the ED, labs were notable for BNP of 993 (up from 371 one month ago), but otherwise normal electrolytes and stable/improved anemia with Hgb of 11.6. CXR was negative for any acute cardiopulmonary processes but did show blunting of the left costophrenic angle which appears unchanged from her previous CXR. She was diuresed with IV lasix  40 mg yesterday, although urine output was not recorded and her weight is actually up today.   Overnight Events: No acute events overnight  Interim History: This morning she does not feel much better, although her leg swelling has improved some. She denies chest pain or palpitations, but does endorse SOB at rest. She has a cough that is worse from baseline, but has no sputum production.   She denies orthopnea, PND, fevers, chills, abd pain, vomiting, diarrhea. She has not had a bowel movement in 1 week and she also feels nauseous right now.   OBJECTIVE:  Vital Signs: Vitals:   06/18/23 1928 06/19/23 0537 06/19/23 0753 06/19/23 0852   BP: (!) 174/60 (!) 211/70 (!) 172/53   Pulse: 75 74 77 78  Resp: 20 17 20    Temp: 98.4 F (36.9 C) 97.9 F (36.6 C) 98.1 F (36.7 C)   TempSrc: Oral Oral Oral   SpO2: 96% 95% 98% 98%  Weight:  64.8 kg    Height:       Supplemental O2: Nasal Cannula SpO2: 98 % O2 Flow Rate (L/min): 2 L/min  Filed Weights   06/18/23 1203 06/18/23 1642 06/19/23 0537  Weight: 63.5 kg 65 kg 64.8 kg     Intake/Output Summary (Last 24 hours) at 06/19/2023 1129 Last data filed at 06/19/2023 1100 Gross per 24 hour  Intake 1680 ml  Output 1250 ml  Net 430 ml   Net IO Since Admission: 430 mL [06/19/23 1129]  Physical Exam: General: Chronically ill appearing, no acute distress CV: Irregularly irregular. JVD present.  Pulmonary: Normal work of breathing on room air. Rales in lung bases and diminished breath sounds. No wheezing.   Abdominal: Soft, nontender, nondistended. Normal bowel sounds. Extremities/Skin: 2+ pitting edema in bilateral lower extremities extending up to knees with overlying skin redness, but no warmth.  Neuro: A&Ox3. Moves all extremities spontaneously.  Psych: Normal mood and affect   ASSESSMENT/PLAN:  Assessment: Principal Problem:   Acute on chronic heart failure with preserved ejection fraction (HFpEF) (HCC)   Plan: Acute on chronic HFpEF Pulmonary hypertension Last echo in April showed EF of 60-65%, moderate mitral stenosis, moderately reduced RV function and severely elevated  PASP consistent with right heart failure. She presented yesterday with bilateral lower extremity edema, SOB, and elevated BNP to >900. Diuresed with IV lasix  40 mg yesterday, although urine output was not recorded and weight has increased today. Patient remains volume overloaded on exam today. - Consulted cardiology re: mitral valve stenosis despite repair + consideration of RHC once euvolemic - IV lasix  80 mg BID today - Strict I&Os, daily weights - Daily BMP, Mg - Continue home losartan  50 mg  daily and metoprolol  25 mg daily - PT/OT - Suspect she will need more than 20 mg lasix  at discharge - Consider addition of spironolactone   Paroxysmal afib S/P mitral valve repair Hypertension On losartan  and metoprolol  as above. Also resumed home warfarin. SBP has been elevated to the 170s this morning, but most recently is 145/105.  - Continue losartan  and metoprolol  - Daily PT/INR  Emphysema Chronic hypoxic respiratory On Breztri  here (Trelegy at home) and DuoNebs prn. Of note, the patient uses 2 L [place] at bedtime.  - Maintain SpO2 between 88-92% - Incentive spirometry  - Consider pulmonary rehab   T2DM On Tresiba  30u daily at home. On semglee  30u daily + SSI here.  - F/u A1c  Depression On venlafaxine , cymbalta , wellbutrin , ramelteon , and xanax  at home. Appears that the patient's PCP has switched her from cymbalta  to effexor , although both were resumed at time of admission.  - Discontinue cymbalta  - Continue venlafaxine , wellbutrin , ramelteon , and xanax   Iron  deficiency anemia Hb 10.6, MCV 86.8. Has been receiving IV iron  as an outpatient with last infusion on 4/17.  - Daily CBC  Chronic pain On Norco 5-325 two tablets q6h PRN at home and gabapentin  600 mg at bedtime.   Constipation On linzess  at home. Reports that she has not had a bowel movement in a few days. - Continue linzess  - Miralax  daily PRN  Best Practice: Diet: Cardiac diet IVF: Fluids: none VTE: warfarin Code: Full AB: None Therapy Recs: Pending Family Contact: daughter, to be notified. DISPO: Anticipated discharge in 2-3 days to Home vs SNF pending diuresis.  Signature: Gill Delrossi, D.O.  Internal Medicine Resident, PGY-3 [facility]  Pager: [phone] 11:29 AM, 06/19/2023   Please contact the on call pager after 5 pm and on weekends at [phone].

## 2023-06-19 NOTE — Evaluation (Signed)
 Clinical/Bedside Swallow Evaluation Patient Details  Name: [name] MRN: [id number] Date of Birth: 11-Feb-1950  Today's Date: 06/19/2023 Time: SLP Start Time (ACUTE ONLY): 1352 SLP Stop Time (ACUTE ONLY): 1412 SLP Time Calculation (min) (ACUTE ONLY): 20 min  Past Medical History:  Past Medical History:  Diagnosis Date   Abnormality of lung on CXR 02/02/2020   Nonspecific finding on CXR ordered by pulmonologist - c/w inflammation vs infection, f/u imaging suggested, Dr. Antonette Batters office has been in communication about recommended next steps.   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   Non-bleeding when visualized on capsule endoscopy 06/30/2015    Arthritis    "lower back; hands" (02/19/2018)   Asthma    Asymptomatic cholelithiasis 09/25/2015   Seen on CT scan 08/2015   Carotid artery stenosis; s/p R endarterectomy    s/p right endarterectomy (06/2010) Carotid US  (07/2010):  Left: Moderate-to-severe (60-79%) calcific and non-calcific plaque origin and proximal ICA and ECA    Chronic congestive heart failure with left ventricular diastolic dysfunction (HCC) 10/21/2010   Chronic constipation 02/03/2011   Chronic daily headache 01/16/2014   Chronic iron  deficiency anemia    Chronic low back pain 10/06/2012   Chronic venous insufficiency 08/04/2012   Closed fracture of one rib of left side 08/23/2021   ED visit after a fall 08/16/21    COPD (chronic obstructive pulmonary disease) with emphysema (HCC)    PFTs 2018: severe obstructive disease, insignif response to bronchiodilator, mild restriction parenchymal pattern, moderately severe diffusion defect. 2014  FEV1 0.92 (40%), ratio 69, 27% increase in FEV1 with BD, TLC 91%, severe airtrapping, DLCO49% On chronic home O2. Pulmonary rehab referral 05/2012    Dyspnea    Fibromyalgia 08/29/2010    Gastroesophageal reflux disease    History of blood transfusion    "several times"  (02/19/2018)   History of clear cell renal cell carcinoma (HCC), in remission 07/21/2011   s/p cryoablation of left RCC in 09/2011 by Dr. Nereida Banning. Followed by Dr. Joie Narrow  [facility]) .     History of fracture of left hip 10/17/2022   History of hiatal hernia    History of mitral valve replacement with bioprosthetic valve due to mitral stenosis 2012   s/p MVR with a 27-mm pericardial porcine valve (Medtronic Mosaic valve, serial #[id number] on 09/20/10, Dr. Matt Song)    History of obstructive sleep apnea, resolved 2013   resolved per sleep study 07/2019; no apnea, but did have desaturation.  CPAP no longer necessary.  Nocturnal polysomnography (06/2009): Moderate sleep apnea/ hypopnea syndrome , AHI 17.8 per hour with nonpositional hypopneas. CPAP titration to 12 CWP, AHI 2.4 per hour. On nocturnal CPAP via a small resMed Quattro full-face mask with heated humidifier.    History of pneumonia    "once"  (02/19/2018)   History of seborrheic keratosis 09/28/2015   History of stroke without residual deficits 01/17/2022   Hyperlipidemia LDL goal < 100 11/20/2005   Internal and external hemorrhoids without complication 08/04/2012   Lesion of left native kidney 06/01/2020   Incidental finding on recent screening chest CT for lung ca ordered by Dr. Linder Revere pulmonology - he has ordered a f/u renal U/S.    Lichen sclerosus of female genitalia 01/12/2017   Migraine    "none in years" (02/19/2018)   Mild cognitive impairment with memory loss 12/23/2021   Moderately severe major depression (HCC) 11/19/2005  Nocturnal hypoxia per sleep study 07/2019    Osteoporosis    DEXA 2016: T -2.7; DEXA (12/09/2011): L-spine T -3.7, left hip T -1.4 DEXA (12/2004): L-spine T -2.6, left hip -0.1    Paroxysmal atrial fibrillation (HCC) 10/22/2010   s/p Left atrial maze procedure for paroxysmal atrial fibrillation on 09/20/2010 by  Dr Matt Song.  Subsequent splenic infarct, decision was made to re-anticoagulate with coumadin , likely life-long as this is the most likely cause of the splenic infarct.    Personal history of colonic polyps 05/14/2011   Colonoscopy (05/2011): 4 mm adenomatous polyp excised endoscopically Colonoscopy (02/2002): Adenomatous polyp excised endoscopically    Personal history of renal cell carcinoma 09/12/2020   Pneumonia    Pulmonary hypertension due to chronic obstructive pulmonary disease (HCC) 04/25/2016   2014 TEE w PA peak pressure 46 mmHg, s/p MV replacement    Right nephrolithiasis, asymptomatic, incidental finding 09/06/2014   5 mm non-obstructing calculus seen on CT scan 09/05/2014    Right ventricular failure (HCC) 04/25/2016   Severe obesity (BMI 35.0-39.9) with comorbidity (HCC) 10/23/2011   Sleep apnea    Stage 2 chronic kidney disease due to type 2 diabetes mellitus (HCC) 12/16/2018   CKD stage III     Tobacco abuse 07/28/2012   Type 2 diabetes mellitus with complication, with long-term current use of insulin  (HCC)    Type 2 diabetes mellitus with diabetic neuropathy (HCC)    Weight loss due to medication 03/12/2023   Past Surgical History:  Past Surgical History:  Procedure Laterality Date   BIOPSY  07/19/2021   Procedure: BIOPSY;  Surgeon: Ozell Blunt, MD;  Location: [facility] ENDOSCOPY;  Service: Gastroenterology;;   CARDIAC CATHETERIZATION     CARDIAC VALVE REPLACEMENT  Aug. 2012   "mitral valve"   CAROTID ENDARTERECTOMY Right 07/04/2010   by Dr. [name] Cookey for asymptomatic right carotid artery stenosis   CATARACT EXTRACTION W/ INTRAOCULAR LENS  IMPLANT, BILATERAL Bilateral    CHEST TUBE INSERTION  09/24/2010   Dr Carloyn Chi Trigt   COLONOSCOPY  05/12/2011   performed by Dr. Honey Lusty. Showing small internal hemorrhoids, single tubular adenoma polyp   COLONOSCOPY WITH PROPOFOL  N/A 05/29/2020   Procedure: COLONOSCOPY WITH PROPOFOL ;  Surgeon: Ozell Blunt, MD;  Location: [facility]COPY;  Service:  Endoscopy;  Laterality: N/A;   COLONOSCOPY WITH PROPOFOL  N/A 07/19/2021   Procedure: COLONOSCOPY WITH PROPOFOL ;  Surgeon: Ozell Blunt, MD;  Location: [facility];  Service: Gastroenterology;  Laterality: N/A;   CRYOABLATION Left 09/2011   by Dr. Nereida Banning. Followed by Dr. Joie Narrow  [facility]) .     DILATION AND CURETTAGE OF UTERUS     ESOPHAGOGASTRODUODENOSCOPY  05/12/2011   performed by Dr. Honey Lusty. Negative for ulcerations, biopsy negative for evidence of celiac sprue   ESOPHAGOGASTRODUODENOSCOPY N/A 06/29/2015   Procedure: ESOPHAGOGASTRODUODENOSCOPY (EGD);  Surgeon: Ozell Blunt, MD;  Location: [facility] ENDOSCOPY;  Service: Endoscopy;  Laterality: N/A;   ESOPHAGOGASTRODUODENOSCOPY N/A 03/29/2016   Procedure: ESOPHAGOGASTRODUODENOSCOPY (EGD);  Surgeon: Ozell Blunt, MD;  Location: [facility] ENDOSCOPY;  Service: Endoscopy;  Laterality: N/A;   ESOPHAGOGASTRODUODENOSCOPY (EGD) WITH PROPOFOL  N/A 05/29/2020   Procedure: ESOPHAGOGASTRODUODENOSCOPY (EGD) WITH PROPOFOL ;  Surgeon: Ozell Blunt, MD;  Location: [facility];  Service: Endoscopy;  Laterality: N/A;   FRACTURE SURGERY     GIVENS CAPSULE STUDY N/A 06/30/2015   Procedure: GIVENS CAPSULE STUDY;  Surgeon: Ozell Blunt, MD;  Location: [facility] ENDOSCOPY;  Service: Endoscopy;  Laterality: N/A;   GIVENS CAPSULE STUDY N/A 06/29/2015   Procedure: GIVENS CAPSULE STUDY;  Surgeon: Ozell Blunt, MD;  Location: [facility] ENDOSCOPY;  Service: Endoscopy;  Laterality: N/A;   HEMORRHOID SURGERY  1970s?   "lanced"   HYSTEROSCOPY W/ ENDOMETRIAL ABLATION  06/2001   for persistent post-menopausal bleeding // by S. Saint Cranker, M.D.   INTRAMEDULLARY (IM) NAIL INTERTROCHANTERIC Left 10/19/2022   Procedure: LEFT INTRAMEDULLARY (IM) NAIL INTERTROCHANTERIC;  Surgeon: Gaylon Kea, MD;  Location: [facility] OR;  Service: Orthopedics;  Laterality: Left;   IR GENERIC HISTORICAL  08/23/2015   IR RADIOLOGIST EVAL & MGMT 08/23/2015 Erica Hau, MD [facility] INTERV RAD   IR GENERIC HISTORICAL  04/09/2016   IR  RADIOLOGIST EVAL & MGMT 04/09/2016 Erica Hau, MD [facility] INTERV RAD   IR RADIOLOGIST EVAL & MGMT  10/07/2016   IR RADIOLOGIST EVAL & MGMT  06/25/2017   IR RADIOLOGIST EVAL & MGMT  07/31/2020   IR RADIOLOGIST EVAL & MGMT  10/02/2020   IR RADIOLOGIST EVAL & MGMT  12/20/2020   IR RADIOLOGIST EVAL & MGMT  12/25/2021   IR RADIOLOGIST EVAL & MGMT  04/16/2023   LEFT HEART CATH AND CORONARY ANGIOGRAPHY N/A 04/21/2016   Procedure: Left Heart Cath and Coronary Angiography;  Surgeon: Avanell Leigh, MD;  Location: [facility] INVASIVE CV LAB;  Service: Cardiovascular;  Laterality: N/A;   LIPOMA EXCISION  08/2005   occipital lipoma 1.5cm - by Dr. Corliss Dies   LITHOTRIPSY  ~ 2000   MAZE Left 09/20/10   for paroxysmal atrial fibrillation (Dr. Matt Song)   MITRAL VALVE REPLACEMENT  09/20/10    with a 27-mm pericardial porcine valve (Medtronic Mosaic valve, serial #[id number]). 09/20/10, Dr Matt Song   ORIF CLAVICLE FRACTURE Right 01/2004   by Myrtle Atta. Murrel Arnt, M.D for Right clavicle nonunion.; "it's got a pin in it"   POLYPECTOMY  05/29/2020   Procedure: POLYPECTOMY;  Surgeon: Ozell Blunt, MD;  Location: [facility] ENDOSCOPY;  Service: Endoscopy;;   POLYPECTOMY  07/19/2021   Procedure: POLYPECTOMY;  Surgeon: Ozell Blunt, MD;  Location: [facility] ENDOSCOPY;  Service: Gastroenterology;;   RADIOLOGY WITH ANESTHESIA Left 09/12/2020   Procedure: RADIOLOGY WITH ANESTHESIA- RENAL CRYOABLATION;  Surgeon: Erica Hau, MD;  Location: [facility] ORS;  Service: Radiology;  Laterality: Left;   REFRACTIVE SURGERY Bilateral    RIGHT HEART CATH N/A 04/23/2016   Procedure: Right Heart Cath;  Surgeon: Darlis Eisenmenger, MD;  Location: [facility] INVASIVE CV LAB;  Service: Cardiovascular;  Laterality: N/A;   TONSILLECTOMY     TUBAL LIGATION     HPI:  73 y.o. female presented to the ED with SOB and leg swelling. PMH includes COPD, HLD, GERD, T2DM, HTN, and CHF. Pt admitted with CHF exacerbation. 03/27/17 esophagram - mild esophageal dysmotility. Reported some recent  swallowing difficulty to RD. States that a pill "got hung" in her throat morning of 5/9.    Assessment / Plan / Recommendation  Clinical Impression  Pt participated in clinical swallow exam. She reports difficulty swallowing pills this am, which occurs occasionally.  She used to take her pills with applesauce, which helped them transfer through her throat per her report.  Today she presents with no s/s of an oropharyngeal dysphagia.  Oral mechanism exam is normal. She is edentulous but has no difficulty masticating regular solids during our session. She consumed sequential sips of thin liquid without s/s of aspiration.  Recommend continuing current diet; she can take meds whole in puree if she prefers. No SLP f/u needed. Our service will sign off. SLP Visit Diagnosis: Dysphagia, unspecified (R13.10)    Aspiration  Risk  No limitations    Diet Recommendation   Thin;Age appropriate regular  Medication Administration: Whole meds with puree    Other  Recommendations Oral Care Recommendations: Oral care BID    Recommendations for follow up therapy are one component of a multi-disciplinary discharge planning process, led by the attending physician.  Recommendations may be updated based on patient status, additional functional criteria and insurance authorization.  Follow up Recommendations No SLP follow up        Swallow Study   General Date of Onset: 06/18/23 HPI: 73 y.o. female presented to the ED with SOB and leg swelling. PMH includes COPD, HLD, GERD, T2DM, HTN, and CHF. Pt admitted with CHF exacerbation. 03/27/17 esophagram - mild esophageal dysmotility. Reported some recent swallowing difficulty to RD. States that a pill "got hung" in her throat morning of 5/9. Type of Study: Bedside Swallow Evaluation Previous Swallow Assessment: no Diet Prior to this Study: Regular;Thin liquids (Level 0) Temperature Spikes Noted: No Respiratory Status: Room air History of Recent Intubation:  No Behavior/Cognition: Alert;Cooperative;Pleasant mood Oral Cavity Assessment: Within Functional Limits Oral Care Completed by SLP: No Oral Cavity - Dentition: Edentulous Vision: Functional for self-feeding Self-Feeding Abilities: Able to feed self Patient Positioning: Upright in bed Baseline Vocal Quality: Hoarse Volitional Cough: Strong Volitional Swallow: Able to elicit    Oral/Motor/Sensory Function Overall Oral Motor/Sensory Function: Within functional limits   Ice Chips Ice chips: Within functional limits   Thin Liquid Thin Liquid: Within functional limits    Nectar Thick Nectar Thick Liquid: Not tested   Honey Thick Honey Thick Liquid: Not tested   Puree Puree: Within functional limits   Solid     Solid: Within functional limits      Myna Asal Laurice 06/19/2023,2:13 PM  Mylinda Asa L. Beatris Lincoln, MA CCC/SLP Clinical Specialist - Acute Care SLP Acute Rehabilitation Services Office number [phone]

## 2023-06-19 NOTE — Progress Notes (Signed)
 Initial Nutrition Assessment  DOCUMENTATION CODES:   Non-severe (moderate) malnutrition in context of chronic illness  INTERVENTION:  Ensure Max po BID, each supplement provides 150 kcal and 30 grams of protein.  Multivitamin w/ minerals daily Encourage good PO intake  Recommend speech consult  Diet education in AVS  NUTRITION DIAGNOSIS:   Moderate Malnutrition related to chronic illness (CHF, COPD) as evidenced by mild fat depletion, mild muscle depletion.  GOAL:   Patient will meet greater than or equal to 90% of their needs  MONITOR:   PO intake, Supplement acceptance, I & O's, Labs  REASON FOR ASSESSMENT:   Consult Diet education  ASSESSMENT:  73 y.o. female presented to the ED with SOB and leg swelling. PMH includes COPD, HLD, GERD, T2DM, HTN, and CHF. Pt admitted with CHF exacerbation.   Met with pt in room, pt sister later joined. Pt reports not eating great at home PTA, unsure how long this has been going on for. Reports eating 1-2 meals per day, typically breakfast and/or lunch. Eats foods such as, oatmeal, grits ,eggs, pizza. Drinks Ensure Max at home. Denies any issues eating here at the hospital. Shares that she takes insulin  at home.   Pt shares that she has had some swallowing difficulty lately and difficulty swallowing her medication this morning; RD informed MD of report.  Pt reports that she has lost a lot of weight due to being on Ozempic , but is not currently on. Shared that she may go back on it soon. Per EMR, pt with a 15% weight loss over the past year.   RD discussed continuing Ensure Max in hospital to ensure adequate intake to maintain lean muscle mass and continue home regimen. Pt agreeable.   Admission Weight: 65 kg Current Weight: 63.5 kg   Nutrition Related Medications: Lasix , NovoLog  SSI, Semglee , Protonix , Warfarin Labs: Sodium 142, Potassium 3.6, Hgb A1c 6.1 (03/12/23) CBG: 130-228 x 24 hrs   NUTRITION - FOCUSED PHYSICAL EXAM: Flowsheet  Row Most Recent Value  Orbital Region Mild depletion  Upper Arm Region Mild depletion  Thoracic and Lumbar Region Mild depletion  Buccal Region Mild depletion  Temple Region No depletion  Clavicle Bone Region Mild depletion  Clavicle and Acromion Bone Region Mild depletion  Scapular Bone Region Mild depletion  Dorsal Hand No depletion  Patellar Region No depletion  Anterior Thigh Region No depletion  Posterior Calf Region No depletion  Edema (RD Assessment) None  Hair Reviewed  Eyes Reviewed  Mouth Reviewed  Skin Reviewed  Nails Reviewed   Diet Order:   Diet Order             Diet 2 gram sodium Room service appropriate? Yes; Fluid consistency: Thin; Fluid restriction: 1500 mL Fluid  Diet effective now                  EDUCATION NEEDS:  No education needs have been identified at this time  Skin:  Skin Assessment: Reviewed RN Assessment  Last BM:  5/7  Height:  Ht Readings from Last 1 Encounters:  06/18/23 5\' 2"  (1.575 m)   Weight:  Wt Readings from Last 1 Encounters:  06/19/23 64.8 kg   Ideal Body Weight:  50 kg  BMI:  Body mass index is 26.12 kg/m.  Estimated Nutritional Needs: Kcal:  1700-1900 Protein:  80-100 grams Fluid:  >/= 1.7 L   Doneta Furbish RD, LDN Clinical Dietitian

## 2023-06-20 ENCOUNTER — Other Ambulatory Visit (HOSPITAL_COMMUNITY): Payer: Self-pay

## 2023-06-20 DIAGNOSIS — I272 Pulmonary hypertension, unspecified: Secondary | ICD-10-CM | POA: Diagnosis not present

## 2023-06-20 DIAGNOSIS — I5033 Acute on chronic diastolic (congestive) heart failure: Secondary | ICD-10-CM

## 2023-06-20 LAB — CBC
HCT: 37.9 % (ref 36.0–46.0)
Hemoglobin: 10.4 g/dL — ABNORMAL LOW (ref 12.0–15.0)
MCH: 24.4 pg — ABNORMAL LOW (ref 26.0–34.0)
MCHC: 27.4 g/dL — ABNORMAL LOW (ref 30.0–36.0)
MCV: 88.8 fL (ref 80.0–100.0)
Platelets: 249 10*3/uL (ref 150–400)
RBC: 4.27 MIL/uL (ref 3.87–5.11)
RDW: 28.7 % — ABNORMAL HIGH (ref 11.5–15.5)
WBC: 8.6 10*3/uL (ref 4.0–10.5)
nRBC: 0 % (ref 0.0–0.2)

## 2023-06-20 LAB — GLUCOSE, CAPILLARY
Glucose-Capillary: 92 mg/dL (ref 70–99)
Glucose-Capillary: 220 mg/dL — ABNORMAL HIGH (ref 70–99)
Glucose-Capillary: 131 mg/dL — ABNORMAL HIGH (ref 70–99)
Glucose-Capillary: 205 mg/dL — ABNORMAL HIGH (ref 70–99)

## 2023-06-20 LAB — BASIC METABOLIC PANEL WITH GFR
Anion gap: 4 — ABNORMAL LOW (ref 5–15)
BUN: 17 mg/dL (ref 8–23)
CO2: 33 mmol/L — ABNORMAL HIGH (ref 22–32)
Calcium: 8.8 mg/dL — ABNORMAL LOW (ref 8.9–10.3)
Chloride: 105 mmol/L (ref 98–111)
Creatinine, Ser: 0.88 mg/dL (ref 0.44–1.00)
GFR, Estimated: >60 mL/min (ref >60)
Glucose, Bld: 134 mg/dL — ABNORMAL HIGH (ref 70–99)
Potassium: 4.5 mmol/L (ref 3.5–5.1)
Sodium: 142 mmol/L (ref 135–145)

## 2023-06-20 LAB — HEMOGLOBIN A1C
Hgb A1c MFr Bld: 5.9 % — ABNORMAL HIGH (ref 4.8–5.6)
Mean Plasma Glucose: 122.63 mg/dL

## 2023-06-20 LAB — PROTIME-INR
INR: 1.7 — ABNORMAL HIGH (ref 0.8–1.2)
Prothrombin Time: 20.6 s — ABNORMAL HIGH (ref 11.4–15.2)

## 2023-06-20 LAB — MAGNESIUM: Magnesium: 2.1 mg/dL (ref 1.7–2.4)

## 2023-06-20 MED ORDER — HYDRALAZINE HCL 10 MG PO TABS: 10.0000 mg | ORAL_TABLET | Freq: Once | ORAL | Status: DC | PRN

## 2023-06-20 MED ORDER — WARFARIN SODIUM 3 MG PO TABS
3.0000 mg | ORAL_TABLET | Freq: Once | ORAL | Status: AC
  Administered 2023-06-20: 3 mg via ORAL
  Filled 2023-06-20: qty 1

## 2023-06-20 MED ORDER — MENTHOL 3 MG MT LOZG
1.0000 | LOZENGE | OROMUCOSAL | Status: DC | PRN
  Filled 2023-06-20: qty 9

## 2023-06-20 MED ORDER — GUAIFENESIN ER 600 MG PO TB12
600.0000 mg | ORAL_TABLET | Freq: Two times a day (BID) | ORAL | Status: DC | PRN
Start: 2023-06-20 — End: 2023-07-01
  Administered 2023-06-21: 600 mg via ORAL
  Filled 2023-06-20: qty 1

## 2023-06-20 MED ORDER — IPRATROPIUM-ALBUTEROL 0.5-2.5 (3) MG/3ML IN SOLN
3.0000 mL | Freq: Two times a day (BID) | RESPIRATORY_TRACT | Status: DC
  Administered 2023-06-20 – 2023-06-24 (×8): 3 mL via RESPIRATORY_TRACT
  Filled 2023-06-20 (×9): qty 3

## 2023-06-20 MED ORDER — SPIRONOLACTONE 25 MG PO TABS
50.0000 mg | ORAL_TABLET | Freq: Every day | ORAL | Status: DC
  Administered 2023-06-20 – 2023-07-01 (×12): 50 mg via ORAL
  Filled 2023-06-20 (×12): qty 2

## 2023-06-20 MED ORDER — FUROSEMIDE 10 MG/ML IJ SOLN
80.0000 mg | Freq: Two times a day (BID) | INTRAMUSCULAR | Status: AC
  Administered 2023-06-20 (×2): 80 mg via INTRAVENOUS
  Filled 2023-06-20 (×2): qty 8

## 2023-06-20 NOTE — Progress Notes (Signed)
 Patient blood pressure 193/70 this morning. Dr. Sharlon Deacon notified. No new orders received at present time. Will recheck blood pressure at 0405 and continue plan of care.

## 2023-06-20 NOTE — Evaluation (Signed)
 Physical Therapy Evaluation Patient Details Name: [name] MRN: [id number] DOB: 02-02-1951 Today's Date: 06/20/2023  History of Present Illness  Pt is a 73 y.o. female admitted 06/18/23 for acute on chronic heart failure with preserved ejection fraction. Pt presented with BLE edema and DOE. CXR showed no evidence of acute cardiopulmonary process, chronic interstitial prominence, and scattered pulmonary scarring. Felt to be volume overloaded. PMH of oxygen  dependent COPD, mitral stenosis s/p bioprosthetic MV replacement, R carotid artery stenosis s/p endarterectomy 2012, paroxysmal atrial fibrillation, CHF, HLD, GERD, HTN, GI bleeding due to AVM, pulmonary hypertension/RV failure, T2DM, PAD, renal cell cancer s/p left kidney cryoablation, and tobacco use.   Clinical Impression  Pt admitted with above diagnosis. PTA, pt was modI for functional mobility and ADLs. She reports rarely leaving her home, furniture walking as the rollator cannot maneuver within her environment well, and using a w/c provided at her doctor's appointments. Pt currently with functional limitations due to the deficits listed below (see PT Problem List). She completed bed mobility and transfers with supervision for safety. She required CGA for gait using RW. Pt completed the DGI and scored 10/24, which indicates she is at increased fall risk. She also has a significant fall history of more than 20 falls in the past 6 months. Pt will benefit from acute skilled PT to increase her independence and safety with mobility to allow discharge. Recommend post-acute rehab <3 hours/day of therapy to improve balance, decrease fall risk, reduce risk of rehospitalization, and maximize rehab potential prior to return home.    If plan is discharge home, recommend the following: A little help with walking and/or transfers;A little help with bathing/dressing/bathroom;Help with stairs or ramp for entrance;Assist for transportation   Can  travel by private vehicle   Yes    Equipment Recommendations None recommended by PT (Pt already has DME)  Recommendations for Other Services       Functional Status Assessment Patient has had a recent decline in their functional status and demonstrates the ability to make significant improvements in function in a reasonable and predictable amount of time.     Precautions / Restrictions Precautions Precautions: Fall Recall of Precautions/Restrictions: Intact Precaution/Restrictions Comments: watch SpO2 Restrictions Weight Bearing Restrictions Per Provider Order: No      Mobility  Bed Mobility Overal bed mobility: Needs Assistance Bed Mobility: Sit to Supine       Sit to supine: Supervision   General bed mobility comments: Pt greeted seated EOB. Returned to bed without physical assist.    Transfers Overall transfer level: Needs assistance Equipment used: Rolling walker (2 wheels), None Transfers: Sit to/from Stand Sit to Stand: Supervision           General transfer comment: Pt stood from lowest bed height. She demonstrated proper hand placement using RW. Good eccentric control with sitting. Pt stood without AD powering up with BUE support.    Ambulation/Gait Ambulation/Gait assistance: Contact guard assist Gait Distance (Feet): 200 Feet Assistive device: Rolling walker (2 wheels) Gait Pattern/deviations: Step-through pattern, Decreased stride length, Trunk flexed, Drifts right/left, Decreased step length - right, Decreased stance time - left Gait velocity: decreased Gait velocity interpretation: <1.31 ft/sec, indicative of household ambulator   General Gait Details: Pt ambulated within the hallway using RW keeping herself closer to the R of the AD. Her steps were unequal but had adequate foot clearence. Pt intermittently reached to handrail in hallway in place of RW grip. Cued pt to maintain BUE support on RW and on  posture correction. She demonstrated little  adjustment. Worsening unsteadiness with higher level balance challenges.  Stairs            Wheelchair Mobility     Tilt Bed    Modified Rankin (Stroke Patients Only)       Balance Overall balance assessment: History of Falls, Needs assistance Sitting-balance support: No upper extremity supported, Feet supported Sitting balance-Leahy Scale: Good     Standing balance support: Bilateral upper extremity supported, During functional activity, Reliant on assistive device for balance Standing balance-Leahy Scale: Poor Standing balance comment: Pt dependent on RW for support and CGA for safety. She was unsteady but had no LOB. Single Leg Stance - Right Leg: 1 Single Leg Stance - Left Leg: 0 Tandem Stance - Right Leg: 2 Tandem Stance - Left Leg: 1 Rhomberg - Eyes Opened: 8 Rhomberg - Eyes Closed: 5 (increased lateral postural sway)     Standardized Balance Assessment Standardized Balance Assessment : Dynamic Gait Index   Dynamic Gait Index Level Surface: Mild Impairment Change in Gait Speed: Moderate Impairment Gait with Horizontal Head Turns: Moderate Impairment Gait with Vertical Head Turns: Moderate Impairment Gait and Pivot Turn: Moderate Impairment Step Over Obstacle: Moderate Impairment Step Around Obstacles: Mild Impairment Steps: Moderate Impairment (Per pt report, she must use a rail and place both feet on each step before continuing.) Total Score: 10       Pertinent Vitals/Pain Pain Assessment Pain Assessment: No/denies pain    Home Living Family/patient expects to be discharged to:: Private residence Living Arrangements: Alone Available Help at Discharge: Family;Available PRN/intermittently (sister, daughter, granddaughter) Type of Home: House Home Access: Stairs to enter Entrance Stairs-Rails: None Entrance Stairs-Number of Steps: 1   Home Layout: One level Home Equipment: Teacher, English as a foreign language (2 wheels);Cane - single point;Shower seat - built  in;Grab bars - tub/shower;Grab bars - toilet;Hand held Stage manager (4 wheels)      Prior Function Prior Level of Function : Independent/Modified Independent;History of Falls (last six months)             Mobility Comments: Pt reports she doesn't leave the house except doctor's appts. Will transfer to w/c from sister's car when leaving the house. Ambulates using rollator; however, reports it isn't fitting well in the house and has become difficult to navigate. Pt reports furniture walking in order to navigate within the house  Significant fall history with pt reporting >20 falls in the past 6 months. ADLs Comments: ModI for ADLs with significantly increased time and effort and requiring frequent rest breaks. Pt performs light meal prep, using microwave to heat food. Pt's sister and daughter assists with shopping, cleaning, and transportation PRN.     Extremity/Trunk Assessment   Upper Extremity Assessment Upper Extremity Assessment: Defer to OT evaluation    Lower Extremity Assessment Lower Extremity Assessment: Generalized weakness       Communication   Communication Communication: No apparent difficulties Factors Affecting Communication: Hearing impaired    Cognition Arousal: Alert Behavior During Therapy: WFL for tasks assessed/performed   PT - Cognitive impairments: No apparent impairments                       PT - Cognition Comments: Pt A,Ox4 Following commands: Intact       Cueing Cueing Techniques: Verbal cues, Gestural cues, Visual cues     General Comments General comments (skin integrity, edema, etc.): VSS on 2L O2    Exercises     Assessment/Plan  PT Assessment Patient needs continued PT services  PT Problem List Decreased balance;Decreased strength;Decreased activity tolerance;Decreased mobility;Decreased safety awareness       PT Treatment Interventions DME instruction;Gait training;Functional mobility training;Therapeutic  activities;Therapeutic exercise;Balance training    PT Goals (Current goals can be found in the Care Plan section)  Acute Rehab PT Goals Patient Stated Goal: Return Home and stop falling PT Goal Formulation: With patient Time For Goal Achievement: 07/04/23 Potential to Achieve Goals: Fair    Frequency Min 2X/week     Co-evaluation               AM-PAC PT "6 Clicks" Mobility  Outcome Measure Help needed turning from your back to your side while in a flat bed without using bedrails?: A Little Help needed moving from lying on your back to sitting on the side of a flat bed without using bedrails?: A Little Help needed moving to and from a bed to a chair (including a wheelchair)?: A Little Help needed standing up from a chair using your arms (e.g., wheelchair or bedside chair)?: A Little Help needed to walk in hospital room?: A Little Help needed climbing 3-5 steps with a railing? : A Lot 6 Click Score: 17    End of Session Equipment Utilized During Treatment: Gait belt;Oxygen  Activity Tolerance: Patient tolerated treatment well Patient left: in bed;with call bell/phone within reach;with bed alarm set Nurse Communication: Mobility status PT Visit Diagnosis: History of falling (Z91.81);Repeated falls (R29.6);Unsteadiness on feet (R26.81);Difficulty in walking, not elsewhere classified (R26.2);Muscle weakness (generalized) (M62.81)    Time: 1610-9604 PT Time Calculation (min) (ACUTE ONLY): 31 min   Charges:   PT Evaluation $PT Eval Moderate Complexity: 1 Mod PT Treatments $Gait Training: 8-22 mins PT General Charges $$ ACUTE PT VISIT: 1 Visit         Glenford Lanes, PT, DPT Acute Rehabilitation Services Office: [phone] Secure Chat Preferred  Riva Chester 06/20/2023, 3:38 PM

## 2023-06-20 NOTE — Evaluation (Signed)
 Occupational Therapy Evaluation Patient Details Name: [name] MRN: [id number] DOB: October 19, 1950 Today's Date: 06/20/2023   History of Present Illness   Pt is a 73 y.o. female admitted 06/18/23 for acute on chronic heart failure with preserved ejection fraction. Pt presented with BLE edema and DOE. CXR showed no evidence of acute cardiopulmonary process, chronic interstitial prominence, and scattered pulmonary scarring. Felt to be volume overloaded. PMH of oxygen  dependent COPD, mitral stenosis s/p bioprosthetic MV replacement, R carotid artery stenosis s/p endarterectomy 2012, paroxysmal atrial fibrillation, CHF, HLD, GERD, HTN, GI bleeding due to AVM, pulmonary hypertension/RV failure, T2DM, PAD, renal cell cancer s/p left kidney cryoablation, and tobacco use.     Clinical Impressions At baseline, pt completes ADLs with Mod I with significantly increased time and effort. Pt reports she was using a Rollator for functional mobility but it became difficult to navigate and she has not been using any AD. Pt further reports more than 20 falls in the past 6 months. At baseline, pt receives assistance from family for shopping, some meal prep, transportation, and cleaning. Pt now presents with decreased activity tolerance, decreased balance, decreased cognition and safety awareness, generalized B UE weakness, and decreased safety and independence with functional tasks. Pt currently demonstrates ability to complete UB ADLs with Mod I to Supervision, LB ADLs with Set up to Supervision, and functional transfers with a RW with Supervision. Pt requiring cues for safety throughout session. Pt will benefit from acute skilled OT services to address deficits outlined below and to increase safety and independence with functional tasks. Post acute discharge, pt will benefit from intensive inpatient skilled rehab services < 3 hours per day to maximize rehab potential, decrease risk of falls, and decrease risk of  rehospitalization.      If plan is discharge home, recommend the following:   A little help with walking and/or transfers;A little help with bathing/dressing/bathroom;Assistance with cooking/housework;Assist for transportation;Help with stairs or ramp for entrance     Functional Status Assessment   Patient has had a recent decline in their functional status and demonstrates the ability to make significant improvements in function in a reasonable and predictable amount of time.     Equipment Recommendations   None recommended by OT (Pt already has needed equipment)     Recommendations for Other Services         Precautions/Restrictions   Precautions Precautions: Fall;Other (comment) Precaution/Restrictions Comments: watch O2 Restrictions Weight Bearing Restrictions Per Provider Order: No     Mobility Bed Mobility               General bed mobility comments: Pt sitting EOB at beginning and end of session.    Transfers Overall transfer level: Needs assistance Equipment used: Rolling walker (2 wheels) Transfers: Sit to/from Stand, Bed to chair/wheelchair/BSC Sit to Stand: Supervision     Step pivot transfers: Supervision     General transfer comment: cues for hand placement/technique and for safety      Balance Overall balance assessment: Needs assistance Sitting-balance support: Single extremity supported, No upper extremity supported, Feet supported Sitting balance-Leahy Scale: Good     Standing balance support: Single extremity supported, Bilateral upper extremity supported, During functional activity Standing balance-Leahy Scale: Fair Standing balance comment: Pt able to briefly hold static stand this session but requires UE support for dynamic standing/stepping during functional tasks.  ADL either performed or assessed with clinical judgement   ADL Overall ADL's : Needs  assistance/impaired Eating/Feeding: Modified independent;Sitting   Grooming: Set up;Sitting   Upper Body Bathing: Supervision/ safety;Set up;Sitting;Cueing for safety   Lower Body Bathing: Supervison/ safety;Cueing for safety;Sitting/lateral leans;Sit to/from stand Lower Body Bathing Details (indicate cue type and reason): with increased time and effort Upper Body Dressing : Supervision/safety;Sitting   Lower Body Dressing: Supervision/safety;Cueing for safety;Sitting/lateral leans;Sit to/from stand   Toilet Transfer: Supervision/safety;Cueing for safety;BSC/3in1;Rolling walker (2 wheels) (step-pivot transfer; cues for hand placement/technique)   Toileting- Clothing Manipulation and Hygiene: Set up;Supervision/safety;Sitting/lateral lean;Sit to/from stand       Functional mobility during ADLs:  (Pt declining further mobility this session due to lunch due to arrive soon.) General ADL Comments: Pt with decreased activity tolerance, fatiguing quickly during functional tasks and with increased LOB in standing/stepping with O2 sat stable throughout.     Vision Baseline Vision/History: 1 Wears glasses (readers; bifocals; pt reports she has a new pair of bifocals ordered but has not yet received them) Ability to See in Adequate Light: 0 Adequate (with glasses) Patient Visual Report: No change from baseline       Perception         Praxis         Pertinent Vitals/Pain Pain Assessment Pain Assessment: No/denies pain     Extremity/Trunk Assessment Upper Extremity Assessment Upper Extremity Assessment: Right hand dominant;Generalized weakness   Lower Extremity Assessment Lower Extremity Assessment: Defer to PT evaluation       Communication Communication Communication: No apparent difficulties   Cognition Arousal: Alert Behavior During Therapy: WFL for tasks assessed/performed Cognition: Cognition impaired, No family/caregiver present to determine baseline      Awareness: Intellectual awareness intact, Online awareness impaired   Attention impairment (select first level of impairment): Alternating attention Executive functioning impairment (select all impairments): Reasoning, Problem solving OT - Cognition Comments: Pt AAOx4 and pleasant throughout session.                 Following commands: Intact       Cueing  General Comments   Cueing Techniques: Verbal cues;Gestural cues;Visual cues  HR in the 70s and O2 >/95% on 2L continuous O2 through nasal cannula throughout session   Exercises     Shoulder Instructions      Home Living Family/patient expects to be discharged to:: Private residence Living Arrangements: Alone Available Help at Discharge: Family;Available PRN/intermittently (sister, daughter, granddaughter) Type of Home: House Home Access: Stairs to enter Secretary/administrator of Steps: 1 Entrance Stairs-Rails: None Home Layout: One level     Bathroom Shower/Tub: Producer, television/film/video: Handicapped height Bathroom Accessibility: No   Home Equipment: Teacher, English as a foreign language (2 wheels);Cane - single point;Shower seat - built in;Grab bars - tub/shower;Grab bars - toilet;Hand held Stage manager (4 wheels)          Prior Functioning/Environment Prior Level of Function : Independent/Modified Independent;Needs assist;History of Falls (last six months)             Mobility Comments: Pt reports she was using Rollator for funcitonal mobility, but that it became difficult to navigate. Although she has a SPC and RW, she states she stopped using any AD after it became difficult to use the Rollator. Pt reports more than 20 falls in the past 6 months. ADLs Comments: Pt reports she completes ADLs Mod I with significantly increased time and effort and requiring frequent rest breaks. Pt performs light meal prep, using  microwave to heat food. Pt's sister and daughter assists with shopping, cleaning, and  transportation PRN.    OT Problem List: Decreased strength;Decreased activity tolerance;Impaired balance (sitting and/or standing);Decreased safety awareness;Cardiopulmonary status limiting activity   OT Treatment/Interventions: Self-care/ADL training;Therapeutic exercise;Energy conservation;DME and/or AE instruction;Therapeutic activities;Patient/family education;Balance training      OT Goals(Current goals can be found in the care plan section)   Acute Rehab OT Goals Patient Stated Goal: to be able to do daily tasks without getting too tired OT Goal Formulation: With patient Time For Goal Achievement: 07/04/23 Potential to Achieve Goals: Good ADL Goals Pt Will Perform Grooming: with modified independence;standing Pt Will Perform Lower Body Bathing: with modified independence;sitting/lateral leans;sit to/from stand Pt Will Perform Lower Body Dressing: with modified independence;sitting/lateral leans;sit to/from stand Pt Will Transfer to Toilet: with modified independence;ambulating;regular height toilet (with least restrictive AD) Pt/caregiver will Perform Home Exercise Program: Increased strength;Both right and left upper extremity;With theraband;With Supervision;Independently;With written HEP provided (Increased activity tolerance) Additional ADL Goal #1: Patient will demonstrate ability to Independently state 3 energy conservation strategies to increase safety and independence with functional tasks. Additional ADL Goal #2: Patient will demonstrate ability to Independently state 3 fall prevention strategies for increased safety and independence during functional tasks.   OT Frequency:  Min 2X/week    Co-evaluation              AM-PAC OT "6 Clicks" Daily Activity     Outcome Measure Help from another person eating meals?: None Help from another person taking care of personal grooming?: A Little Help from another person toileting, which includes using toliet, bedpan, or  urinal?: A Little Help from another person bathing (including washing, rinsing, drying)?: A Little Help from another person to put on and taking off regular upper body clothing?: A Little Help from another person to put on and taking off regular lower body clothing?: A Little 6 Click Score: 19   End of Session Equipment Utilized During Treatment: Rolling walker (2 wheels);Gait belt;Oxygen  Nurse Communication: Mobility status;Other (comment) (Pt sitting EOB. Pt  educated in need to have staff with her when getting to [facility] or for mobility in room with pt verbalizing understanding but insisting RW be left within her reach. RN stating she will follow up with pt.)  Activity Tolerance: Patient tolerated treatment well Patient left: in bed;with call bell/phone within reach (sitting EOB)  OT Visit Diagnosis: Unsteadiness on feet (R26.81);Other abnormalities of gait and mobility (R26.89);Muscle weakness (generalized) (M62.81);Other (comment) (decreased safety awareness)                Time: 4782-9562 OT Time Calculation (min): 23 min Charges:  OT General Charges $OT Visit: 1 Visit OT Evaluation $OT Eval Low Complexity: 1 Low OT Treatments $Self Care/Home Management : 8-22 mins  Langford Carias "Darral Ellis., OTR/L, MA Acute Rehab [phone]  Walt Gunner 06/20/2023, 1:27 PM

## 2023-06-20 NOTE — Progress Notes (Signed)
 PHARMACY - ANTICOAGULATION CONSULT NOTE  Pharmacy Consult for warfarin Indication: atrial fibrillation  Allergies  Allergen Reactions   Dexilant [Dexlansoprazole] Other (See Comments)    Made acid reflux worse   Metoclopramide      Other Reaction(s): unknown   Isovue  [Iopamidol ] Itching and Rash   Lorazepam Other (See Comments)    Patient's sister noted that ativan caused the patient to become extremely confused during hospitalization 09/2010; tolerates Xanax    Oxycontin  [Oxycodone ] Other (See Comments)    headache   Tramadol Hcl Swelling    Ankle swelling    Patient Measurements: Height: 5\' 2"  (157.5 cm) Weight: 64.9 kg (143 lb 1.3 oz) IBW/kg (Calculated) : 50.1 HEPARIN  DW (KG): 63.3  Vital Signs: Temp: 98.2 F (36.8 C) (05/10 0335) Temp Source: Oral (05/10 0335) BP: 184/64 (05/10 0409) Pulse Rate: 69 (05/10 0409)  Labs: Recent Labs    06/18/23 1223 06/18/23 1606 06/18/23 1735 06/19/23 0237 06/20/23 0245  HGB 11.3* 11.6*  --  10.6* 10.4*  HCT 41.4 41.6  --  38.2 37.9  PLT 305 312  --  270 249  LABPROT  --   --  21.5* 22.4* 20.6*  INR  --   --  1.8* 1.9* 1.7*  CREATININE 0.72 0.74  --  0.76 0.88  TROPONINIHS 16  --   --   --   --     Estimated Creatinine Clearance: 49.6 mL/min (by C-G formula based on SCr of 0.88 mg/dL).   Assessment: 73 YO female with PMH significant for R carotid artery stenosis s/p endarterectomy, bioMVR and atrial fibrillation on warfarin PTA who is admitted for eleavted BNP and BLE edema.  Pharmacy consulted to manage warfarin. INR 1.8 on admission, last warfarin dose 5/07 per patient.   PTA warfarin regimen: 1.25 mg Mon, 2.5 mg all other days per 5/6 anticoag visit (2.5 mg tablets) *5/6 INR = 3 on 3.75 mg Fri, 2.5 mg AOD (reduced to above regimen)  Notable DDI's: none  INR 1.7, subtherapeutic.  Hgb 10.4 (down), pltc 249 ok.  Per chart review, patient "resistant to a DOAC because she hasn't understood that these new medications are  indicated as alternatives" back in 2012.  Will perform benefits investigation and inquire about willingness to switch to DOAC.   Goal of Therapy:  INR 2-2.5 Monitor platelets by anticoagulation protocol: Yes   Plan:  Give warfarin 3mg  PO x1 dose Check INR daily while on warfarin Continue to monitor H&H and platelets   Thank you for allowing pharmacy to be a part of this patient's care.  Harvest Lineman, PharmD PGY1 Pharmacy Resident

## 2023-06-20 NOTE — Progress Notes (Signed)
 Patient blood pressure 184/64 HR 69. Dr. Sharlon Deacon notified. No new orders received at present time. Will continue current plan of care.

## 2023-06-20 NOTE — Progress Notes (Signed)
 Subjective Feels the same in terms of SOB. Denies chest pain/palpitations  Physical exam Blood pressure (!) 192/65, pulse 76, temperature 97.8 F (36.6 C), temperature source Oral, resp. rate 17, height 5\' 2"  (1.575 m), weight 64.9 kg, SpO2 97%.  Constitutional: lying in bed, in no acute distress Cardiovascular: regular rate and rhythm, no m/r/g, bilateral 2+ pitting edema to knees Pulmonary/Chest: normal work of breathing on 2 L [place], crackles bilaterally most notably on right Abdominal: soft, non-tender, non-distended Neurological: alert & oriented Skin: warm and dry, bilateral LE erythema consistent with venous stasis dermatitis    Weight change: 1.397 kg   Intake/Output Summary (Last 24 hours) at 06/20/2023 1107 Last data filed at 06/20/2023 0859 Gross per 24 hour  Intake 477 ml  Output 500 ml  Net -23 ml   Net IO Since Admission: 407 mL [06/20/23 1107]  Labs, images, and other studies    Latest Ref Rng & Units 06/20/2023    2:45 AM 06/19/2023    2:37 AM 06/18/2023    4:06 PM  CBC  WBC 4.0 - 10.5 K/uL 8.6  7.2  7.3   Hemoglobin 12.0 - 15.0 g/dL 16.1  09.6  04.5   Hematocrit 36.0 - 46.0 % 37.9  38.2  41.6   Platelets 150 - 400 K/uL 249  270  312        Latest Ref Rng & Units 06/20/2023    2:45 AM 06/19/2023    2:37 AM 06/18/2023    4:06 PM  BMP  Glucose 70 - 99 mg/dL 409  811    BUN 8 - 23 mg/dL 17  10    Creatinine 9.14 - 1.00 mg/dL 7.82  9.56  2.13   Sodium 135 - 145 mmol/L 142  142    Potassium 3.5 - 5.1 mmol/L 4.5  3.6    Chloride 98 - 111 mmol/L 105  106    CO2 22 - 32 mmol/L 33  30    Calcium  8.9 - 10.3 mg/dL 8.8  8.5       Assessment and plan Hospital day 2  [name] is a 73 y.o.female pertinent PMH of R carotid artery stenosis s/p endarterectomy, s/p MVR, HFpEF, pAF on warfarin, tobacco use disorder, T2DM, and COPD/emphysema on 2 L [place] at bedtime, who presented from the [facility] with bilateral lower extremity edema and admitted for  HFpEF exacerbation.   Acute on chronic HFpEF Pulmonary hypertension Cardiology following, appreciate recommendations. S/p IV Lasix  80 mg X 2 yesterday. Decent output with continued LEE today, - IV lasix  80 mg BID today - Strict I&Os, daily weights - Daily BMP, Mg - Continue home losartan  50 mg daily and metoprolol  25 mg daily - PT/OT - Suspect she will need more than 20 mg lasix  at discharge - Consider addition of spironolactone    Paroxysmal afib S/P mitral valve repair Hypertension On losartan  and metoprolol  as above. Also resumed home warfarin per pharmacy (INR subtherapeutic today). SBP elevated to the 190s this morning and have since improved. Anticipate continued improvement with diuresis. - Continue losartan  and metoprolol  -Consider Aldactone is continually elevated. Will provide po Hydralazine  once prn in the meantime. - Daily PT/INR   Emphysema Chronic hypoxic respiratory On Breztri  here (Trelegy at home) and DuoNebs prn. Of note, the patient uses 2 L [place] at bedtime.  - Maintain SpO2 between 88-92% - Incentive spirometry  - Consider pulmonary rehab  T2DM A1c on admission is 5.9. CBG's generally within goal. -Continue Semglee  30u daily + SSI   Depression - Continue venlafaxine , wellbutrin , ramelteon , and xanax    Iron  deficiency anemia Stable at 10.2. Has been receiving IV iron  as an outpatient with last infusion on 4/17.  - Daily CBC   Chronic pain -Continue home Norco 5-325 two tablets q6h PRN and gabapentin  600 mg at bedtime.    Constipation On linzess  at home. Had a BM yesterday - Continue linzess  - Miralax  daily PRN    Ronni Colace, DO 06/20/2023, 11:07 AM  Pager: [phone] After 5pm or weekend: [phone]

## 2023-06-20 NOTE — Plan of Care (Signed)
  Problem: Education: Goal: Knowledge of General Education information will improve Description: Including pain rating scale, medication(s)/side effects and non-pharmacologic comfort measures Outcome: Progressing   Problem: Health Behavior/Discharge Planning: Goal: Ability to manage health-related needs will improve Outcome: Progressing   Problem: Clinical Measurements: Goal: Will remain free from infection Outcome: Progressing   Problem: Activity: Goal: Risk for activity intolerance will decrease Outcome: Progressing   Problem: Nutrition: Goal: Adequate nutrition will be maintained Outcome: Progressing   Problem: Pain Managment: Goal: General experience of comfort will improve and/or be controlled Outcome: Progressing   Problem: Skin Integrity: Goal: Risk for impaired skin integrity will decrease Outcome: Progressing   Problem: Coping: Goal: Ability to adjust to condition or change in health will improve Outcome: Progressing   Problem: Fluid Volume: Goal: Ability to maintain a balanced intake and output will improve Outcome: Progressing

## 2023-06-20 NOTE — Progress Notes (Signed)
 Progress Note  Patient Name: [name] Date of Encounter: 06/20/2023  Primary Cardiologist: None   Subjective   Patient seen and examined at her bedside,   Inpatient Medications    Scheduled Meds:  ALPRAZolam   0.5 mg Oral QHS   budeson-glycopyrrolate -formoterol   2 puff Inhalation BID   buPROPion   150 mg Oral BH-q7a   furosemide   80 mg Intravenous BID   gabapentin   600 mg Oral QHS   insulin  aspart  0-15 Units Subcutaneous TID WC   insulin  glargine-yfgn  30 Units Subcutaneous Daily   ipratropium-albuterol   3 mL Nebulization BID   linaclotide   72 mcg Oral QAC breakfast   losartan   50 mg Oral Daily   metoprolol  succinate  25 mg Oral Daily   multivitamin with minerals  1 tablet Oral Daily   nicotine   14 mg Transdermal Daily   pantoprazole   40 mg Oral Daily   Ensure Max Protein  11 oz Oral BID   ramelteon   8 mg Oral QHS   venlafaxine  XR  150 mg Oral Q breakfast   warfarin  3 mg Oral ONCE-1600   Warfarin - Pharmacist Dosing Inpatient   Does not apply q1600   Continuous Infusions:  PRN Meds: guaiFENesin , HYDROcodone -acetaminophen , menthol -cetylpyridinium, ondansetron  (ZOFRAN ) IV, polyethylene glycol   Vital Signs    Vitals:   06/20/23 0335 06/20/23 0341 06/20/23 0409 06/20/23 0753  BP: (!) 193/70  (!) 184/64 (!) 192/65  Pulse: 67  69 76  Resp: 20  20 17   Temp: 98.2 F (36.8 C)   97.8 F (36.6 C)  TempSrc: Oral   Oral  SpO2: 95%  98% 97%  Weight:  64.9 kg    Height:        Intake/Output Summary (Last 24 hours) at 06/20/2023 1049 Last data filed at 06/20/2023 0859 Gross per 24 hour  Intake 957 ml  Output 900 ml  Net 57 ml   Filed Weights   06/18/23 1642 06/19/23 0537 06/20/23 0341  Weight: 65 kg 64.8 kg 64.9 kg    Telemetry     - Personally Reviewed  ECG     - Personally Reviewed  Physical Exam   General: Comfortable Head: Atraumatic, normal size  Eyes: PEERLA, EOMI  Neck: Supple, normal JVD Cardiac: Normal S1, S2; RRR; no murmurs,  rubs, or gallops Lungs: Clear to auscultation bilaterally Abd: Soft, nontender, no hepatomegaly  Ext: warm, no edema Musculoskeletal: No deformities, BUE and BLE strength normal and equal Skin: Warm and dry, no rashes   Neuro: Alert and oriented to person, place, time, and situation, CNII-XII grossly intact, no focal deficits  Psych: Normal mood and affect   Labs    Chemistry Recent Labs  Lab 06/18/23 1223 06/18/23 1606 06/19/23 0237 06/20/23 0245  NA 142  --  142 142  K 3.8  --  3.6 4.5  CL 106  --  106 105  CO2 27  --  30 33*  GLUCOSE 120*  --  127* 134*  BUN 12  --  10 17  CREATININE 0.72 0.74 0.76 0.88  CALCIUM  8.8*  --  8.5* 8.8*  GFRNONAA >60 >60 >60 >60  ANIONGAP 9  --  6 4*     Hematology Recent Labs  Lab 06/18/23 1606 06/19/23 0237 06/20/23 0245  WBC 7.3 7.2 8.6  RBC 4.77 4.40 4.27  HGB 11.6* 10.6* 10.4*  HCT 41.6 38.2 37.9  MCV 87.2 86.8 88.8  MCH 24.3* 24.1* 24.4*  MCHC 27.9* 27.7* 27.4*  RDW 28.7* 28.9* 28.7*  PLT 312 270 249    Cardiac EnzymesNo results for input(s): "TROPONINI" in the last 168 hours. No results for input(s): "TROPIPOC" in the last 168 hours.   BNP Recent Labs  Lab 06/18/23 1223  BNP 993.8*     DDimer No results for input(s): "DDIMER" in the last 168 hours.   Radiology    DG Chest 2 View Result Date: 06/18/2023 CLINICAL DATA:  Shortness of breath, cough. EXAM: CHEST - 2 VIEW COMPARISON:  Radiographs 05/20/2023, 05/15/2023 and 11/19/2022. CT 10/17/2022. FINDINGS: Stable mild cardiac enlargement post median sternotomy and mitral valve replacement. There is aortic atherosclerosis. Chronic blunting of the left costophrenic sulcus. Mild chronic interstitial prominence and scattered pulmonary scarring. No edema, confluent airspace disease, pneumothorax or significant pleural effusion. The bones appear unchanged post right clavicular ORIF. Old rib fractures are noted on the right. No acute osseous findings are seen. IMPRESSION: No  evidence of acute cardiopulmonary process. Chronic interstitial prominence and scattered pulmonary scarring. Electronically Signed   By: Elmon Hagedorn M.D.   On: 06/18/2023 12:33    Cardiac Studies   echo  Patient Profile     73 y.o. female with hx of oxygen  dependent COPD, mitral stenosis s/p bioprosthetic MV replacement, R carotid artery stenosis s/p endarterectomy 2012, paroxysmal atrial fibrillation, GI bleeding due to AVM, pulmonary hypertension/RV failure, type 2 DM, PAD, renal cell cancer s/p left kidney cryoablation, tobacco use,   Assessment & Plan    Acute on chronic heart failure with preserved ejection fraction  RV failure  Pulmonary hypertension  Status post MVR now with mitral stenosis - not a surgical candidate  OSA  PAF now in sinus rhythm    Continue current dose of diuretics, not sure if I&Os are accurate. In sinus rhythm continue current regimen metoprolol  and warfarin  Is not a candidate for surgery in terms of her mitral valve disease - medical managements for symptoms and heart failure  Follow Dr Mclean for Pulm HTN - previous notes in her case no good options for medical treament We will continue to follow.      For questions or updates, please contact [facility] Please consult [email or web address] for contact info under Cardiology/STEMI.      Signed, Novalyn Lajara, DO  06/20/2023, 10:49 AM

## 2023-06-21 DIAGNOSIS — I5033 Acute on chronic diastolic (congestive) heart failure: Secondary | ICD-10-CM | POA: Diagnosis not present

## 2023-06-21 LAB — BASIC METABOLIC PANEL WITH GFR
Anion gap: 7 (ref 5–15)
BUN: 24 mg/dL — ABNORMAL HIGH (ref 8–23)
CO2: 33 mmol/L — ABNORMAL HIGH (ref 22–32)
Calcium: 9 mg/dL (ref 8.9–10.3)
Chloride: 99 mmol/L (ref 98–111)
Creatinine, Ser: 0.87 mg/dL (ref 0.44–1.00)
GFR, Estimated: >60 mL/min (ref >60)
Glucose, Bld: 206 mg/dL — ABNORMAL HIGH (ref 70–99)
Potassium: 3.9 mmol/L (ref 3.5–5.1)
Sodium: 139 mmol/L (ref 135–145)

## 2023-06-21 LAB — CBC
HCT: 36.4 % (ref 36.0–46.0)
Hemoglobin: 10.3 g/dL — ABNORMAL LOW (ref 12.0–15.0)
MCH: 24.6 pg — ABNORMAL LOW (ref 26.0–34.0)
MCHC: 28.3 g/dL — ABNORMAL LOW (ref 30.0–36.0)
MCV: 87.1 fL (ref 80.0–100.0)
Platelets: 263 10*3/uL (ref 150–400)
RBC: 4.18 MIL/uL (ref 3.87–5.11)
RDW: 28.1 % — ABNORMAL HIGH (ref 11.5–15.5)
WBC: 8 10*3/uL (ref 4.0–10.5)
nRBC: 0 % (ref 0.0–0.2)

## 2023-06-21 LAB — GLUCOSE, CAPILLARY
Glucose-Capillary: 166 mg/dL — ABNORMAL HIGH (ref 70–99)
Glucose-Capillary: 233 mg/dL — ABNORMAL HIGH (ref 70–99)
Glucose-Capillary: 145 mg/dL — ABNORMAL HIGH (ref 70–99)
Glucose-Capillary: 123 mg/dL — ABNORMAL HIGH (ref 70–99)

## 2023-06-21 LAB — PROTIME-INR
INR: 1.6 — ABNORMAL HIGH (ref 0.8–1.2)
Prothrombin Time: 18.8 s — ABNORMAL HIGH (ref 11.4–15.2)

## 2023-06-21 LAB — MAGNESIUM: Magnesium: 2 mg/dL (ref 1.7–2.4)

## 2023-06-21 MED ORDER — WARFARIN SODIUM 2.5 MG PO TABS
2.5000 mg | ORAL_TABLET | Freq: Once | ORAL | Status: DC
Start: 2023-06-21 — End: 2023-06-21

## 2023-06-21 MED ORDER — FUROSEMIDE 10 MG/ML IJ SOLN
8.0000 mg/h | INTRAVENOUS | Status: DC
  Administered 2023-06-21 – 2023-06-23 (×3): 8 mg/h via INTRAVENOUS
  Filled 2023-06-21 (×3): qty 20

## 2023-06-21 MED ORDER — WARFARIN SODIUM 3 MG PO TABS
3.0000 mg | ORAL_TABLET | Freq: Once | ORAL | Status: AC
  Administered 2023-06-21: 3 mg via ORAL
  Filled 2023-06-21: qty 1

## 2023-06-21 MED ORDER — FUROSEMIDE 10 MG/ML IJ SOLN: 80.0000 mg | Freq: Two times a day (BID) | INTRAMUSCULAR | Status: DC

## 2023-06-21 NOTE — Plan of Care (Signed)
  Problem: Education: Goal: Knowledge of General Education information will improve Description: Including pain rating scale, medication(s)/side effects and non-pharmacologic comfort measures Outcome: Progressing   Problem: Health Behavior/Discharge Planning: Goal: Ability to manage health-related needs will improve Outcome: Progressing   Problem: Clinical Measurements: Goal: Will remain free from infection Outcome: Progressing Goal: Respiratory complications will improve Outcome: Progressing   Problem: Activity: Goal: Risk for activity intolerance will decrease Outcome: Progressing   Problem: Coping: Goal: Level of anxiety will decrease Outcome: Not Progressing

## 2023-06-21 NOTE — NC FL2 (Signed)
 [place]  MEDICAID FL2 LEVEL OF CARE FORM     IDENTIFICATION  Patient Name: [name] Birthdate: 03-09-1950 Sex: female Admission Date (Current Location): 06/18/2023  [facility] and [place] Number:  Producer, television/film/video and Address:  [facility], [address] [place], [place] [address]      Provider Number:    Attending Physician Name and Address:  Driscilla George, MD  Relative Name and Phone Number:  Sister: Lenoria Raid / [phone]    Current Level of Care: Hospital Recommended Level of Care: Skilled Nursing Facility Prior Approval Number:    Date Approved/Denied: 06/21/23 PASRR Number: [id number]  Discharge Plan: SNF    Current Diagnoses: Patient Active Problem List   Diagnosis Date Noted   Pulmonary hypertension (HCC) 06/20/2023   Malnutrition of moderate degree 06/19/2023   Acute exacerbation of chronic obstructive pulmonary disease (COPD) (HCC) 05/20/2023   Hypertensive urgency 05/20/2023   Iron  deficiency anemia 05/19/2023   IDA (iron  deficiency anemia) 05/19/2023   Atrial fibrillation (HCC) 12/23/2022   History of atrial fibrillation 10/18/2022   Chronic anticoagulation 10/18/2022   History of fracture of left hip 10/17/2022   Generalized pain 08/01/2022   Multifactorial functional impairment 02/13/2022   History of stroke without residual deficits 01/17/2022   Diabetic peripheral neuropathy associated with type 2 diabetes mellitus (HCC) 11/11/2021   Recurrent falls 11/11/2021   Gait instability 09/19/2021   Perennial non-allergic rhinitis 09/19/2021   Chronic, continuous use of opioids for chronic pain 07/10/2021   Polypharmacy 11/08/2020   Personal history of renal cell carcinoma 09/12/2020   Tinea pedis of both feet 08/07/2020   Essential tremor 11/17/2019   Nocturnal hypoxia per sleep study 07/2019    Carotid artery stenosis; s/p R endarterectomy    COPD mixed type (HCC)    Arthritis of both hands  07/01/2019   Stage 2 chronic kidney disease due to type 2 diabetes mellitus (HCC) 12/16/2018   Lichen sclerosus of female genitalia 01/12/2017   Mixed stress and urge urinary incontinence 05/27/2016   Pulmonary hypertension due to chronic obstructive pulmonary disease (HCC) 04/25/2016   Asymptomatic cholelithiasis 09/25/2015   Nausea 07/14/2015   Chronic iron  deficiency anemia 12/26/2014   Aortic atherosclerosis (HCC) 10/19/2014   Right nephrolithiasis, asymptomatic (incidental finding) 09/06/2014   Headache, chronic intermittent 01/14/2013   Chronic low back pain 10/06/2012   Long term current use of anticoagulant therapy 09/30/2012   Recurrent candidal intertrigo 07/28/2012   Tobacco use disorder 07/28/2012   History of colonic polyps 05/14/2011   Sleep disturbance 05/14/2011   Abdominal wall hernia 05/14/2011   Constipation due to opioid therapy 02/03/2011   Shortness of breath 12/06/2010   Health care maintenance 11/04/2010   PAF (paroxysmal atrial fibrillation) (HCC) 10/22/2010   Acute on chronic heart failure with preserved ejection fraction (HFpEF) (HCC) 10/21/2010   Fibromyalgia 08/29/2010   Gastroesophageal reflux disease    Type 2 diabetes mellitus with complication, with long-term current use of insulin  (HCC)    Osteoporosis with current pathological fracture with routine healing    Peripheral arterial disease (HCC) 05/27/2010   History of mitral valve replacement with bioprosthetic valve 2012   Essential hypertension 06/19/2008   Hyperlipidemia 11/20/2005   Severe major depression (HCC) 11/19/2005    Orientation RESPIRATION BLADDER Height & Weight     Self, Situation, Place, Time  O2 Incontinent Weight: 140 lb 10.5 oz (63.8 kg) Height:  5\' 2"  (157.5 cm)  BEHAVIORAL SYMPTOMS/MOOD NEUROLOGICAL BOWEL NUTRITION STATUS  Incontinent Diet  AMBULATORY STATUS COMMUNICATION OF NEEDS Skin   Limited Assist Verbally                         Personal Care  Assistance Level of Assistance  Dressing     Dressing Assistance: Limited assistance     Functional Limitations Info  Hearing, Sight Sight Info: Impaired Hearing Info: Impaired      SPECIAL CARE FACTORS FREQUENCY  PT (By licensed PT), OT (By licensed OT)     PT Frequency: 5x wk OT Frequency: 3x wk            Contractures Contractures Info: Not present    Additional Factors Info  Code Status, Allergies Code Status Info: Full Allergies Info: Dexilant (Dexlansoprazole), Metoclopramide , Isovue  (Iopamidol ), Lorazepam, Oxycontin  (Oxycodone ), Tramadol Hcl           Current Medications (06/21/2023):  This is the current hospital active medication list Current Facility-Administered Medications  Medication Dose Route Frequency Provider Last Rate Last Admin   ALPRAZolam  (XANAX ) tablet 0.5 mg  0.5 mg Oral QHS Atway, Rayann N, DO   0.5 mg at 06/20/23 2130   budeson-glycopyrrolate -formoterol  (BREZTRI ) 160-9-4.8 MCG/ACT inhaler 2 puff  2 puff Inhalation BID Arne Langdon, MD   2 puff at 06/21/23 1610   buPROPion  (WELLBUTRIN  XL) 24 hr tablet 150 mg  150 mg Oral Solon Dura, MD   150 mg at 06/21/23 0608   furosemide  (LASIX ) 200 mg in dextrose  5 % 100 mL (2 mg/mL) infusion  8 mg/hr Intravenous Continuous Tobb, Kardie, DO 4 mL/hr at 06/21/23 1315 8 mg/hr at 06/21/23 1315   gabapentin  (NEURONTIN ) capsule 600 mg  600 mg Oral QHS Arne Langdon, MD   600 mg at 06/20/23 2130   guaiFENesin  (MUCINEX ) 12 hr tablet 600 mg  600 mg Oral BID PRN Gomez-Caraballo, Maria, MD   600 mg at 06/21/23 1201   HYDROcodone -acetaminophen  (NORCO/VICODIN) 5-325 MG per tablet 2 tablet  2 tablet Oral Q6H PRN Arne Langdon, MD   2 tablet at 06/21/23 0329   insulin  aspart (novoLOG ) injection 0-15 Units  0-15 Units Subcutaneous TID WC Arne Langdon, MD   5 Units at 06/21/23 1155   insulin  glargine-yfgn (SEMGLEE ) injection 30 Units  30 Units Subcutaneous Daily Arne Langdon, MD   30 Units at 06/21/23 0929    ipratropium-albuterol  (DUONEB) 0.5-2.5 (3) MG/3ML nebulizer solution 3 mL  3 mL Nebulization BID Driscilla George, MD   3 mL at 06/21/23 9604   linaclotide  (LINZESS ) capsule 72 mcg  72 mcg Oral QAC breakfast Arne Langdon, MD   72 mcg at 06/21/23 5409   losartan  (COZAAR ) tablet 50 mg  50 mg Oral Daily Arne Langdon, MD   50 mg at 06/21/23 8119   menthol -cetylpyridinium (CEPACOL) lozenge 3 mg  1 lozenge Oral PRN Gomez-Caraballo, Maria, MD       metoprolol  succinate (TOPROL -XL) 24 hr tablet 25 mg  25 mg Oral Daily Arne Langdon, MD   25 mg at 06/21/23 1478   multivitamin with minerals tablet 1 tablet  1 tablet Oral Daily Bevelyn Bryant, MD   1 tablet at 06/21/23 2956   nicotine  (NICODERM CQ  - dosed in mg/24 hours) patch 14 mg  14 mg Transdermal Daily Rathore, Vasundhra, MD   14 mg at 06/21/23 0929   ondansetron  (ZOFRAN ) injection 4 mg  4 mg Intravenous Q6H PRN Atway, Rayann N, DO   4 mg at 06/19/23 1701   pantoprazole  (PROTONIX ) EC tablet  40 mg  40 mg Oral Daily Moore, Willie, MD   40 mg at 06/21/23 0928   polyethylene glycol (MIRALAX  / GLYCOLAX ) packet 17 g  17 g Oral Daily PRN Atway, Rayann N, DO       protein supplement (ENSURE MAX) liquid  11 oz Oral BID Bevelyn Bryant, MD   11 oz at 06/21/23 9147   ramelteon  (ROZEREM ) tablet 8 mg  8 mg Oral QHS Arne Langdon, MD   8 mg at 06/20/23 2130   spironolactone (ALDACTONE) tablet 50 mg  50 mg Oral Daily Gomez-Caraballo, Maria, MD   50 mg at 06/21/23 8295   venlafaxine  XR (EFFEXOR -XR) 24 hr capsule 150 mg  150 mg Oral Q breakfast Arne Langdon, MD   150 mg at 06/21/23 0928   warfarin (COUMADIN ) tablet 3 mg  3 mg Oral ONCE-1600 Machen, Julie, MD       Warfarin - Pharmacist Dosing Inpatient   Does not apply A2130 Arne Langdon, MD   Given at 06/20/23 2130     Discharge Medications: Please see discharge summary for a list of discharge medications.  Relevant Imaging Results:  Relevant Lab Results:   Additional Information ss#[id number]  Hilda Lovings,  LCSW

## 2023-06-21 NOTE — Progress Notes (Signed)
 Progress Note  Patient Name: [name] Date of Encounter: 06/21/2023  Primary Cardiologist: None   Subjective   Patient seen and examined at her bedside,   Inpatient Medications    Scheduled Meds:  ALPRAZolam   0.5 mg Oral QHS   budeson-glycopyrrolate -formoterol   2 puff Inhalation BID   buPROPion   150 mg Oral BH-q7a   furosemide   80 mg Intravenous BID   gabapentin   600 mg Oral QHS   insulin  aspart  0-15 Units Subcutaneous TID WC   insulin  glargine-yfgn  30 Units Subcutaneous Daily   ipratropium-albuterol   3 mL Nebulization BID   linaclotide   72 mcg Oral QAC breakfast   losartan   50 mg Oral Daily   metoprolol  succinate  25 mg Oral Daily   multivitamin with minerals  1 tablet Oral Daily   nicotine   14 mg Transdermal Daily   pantoprazole   40 mg Oral Daily   Ensure Max Protein  11 oz Oral BID   ramelteon   8 mg Oral QHS   spironolactone  50 mg Oral Daily   venlafaxine  XR  150 mg Oral Q breakfast   warfarin  3 mg Oral ONCE-1600   Warfarin - Pharmacist Dosing Inpatient   Does not apply q1600   Continuous Infusions:  PRN Meds: guaiFENesin , HYDROcodone -acetaminophen , menthol -cetylpyridinium, ondansetron  (ZOFRAN ) IV, polyethylene glycol   Vital Signs    Vitals:   06/21/23 0154 06/21/23 0536 06/21/23 0924 06/21/23 0932  BP: (!) 172/73 (!) 146/111  (!) 162/52  Pulse: 76 65 79 78  Resp: 19 20 (!) 29 20  Temp: 97.8 F (36.6 C) 97.6 F (36.4 C)  98.1 F (36.7 C)  TempSrc: Oral Oral  Oral  SpO2: 97% 96% 96% 96%  Weight:  63.8 kg    Height:        Intake/Output Summary (Last 24 hours) at 06/21/2023 1200 Last data filed at 06/21/2023 0900 Gross per 24 hour  Intake 1680 ml  Output 1650 ml  Net 30 ml   Filed Weights   06/19/23 0537 06/20/23 0341 06/21/23 0536  Weight: 64.8 kg 64.9 kg 63.8 kg    Telemetry     - Personally Reviewed  ECG     - Personally Reviewed  Physical Exam   General: Comfortable Head: Atraumatic, normal size  Eyes: PEERLA,  EOMI  Neck: Supple, normal JVD Cardiac: Normal S1, S2; RRR; no murmurs, rubs, or gallops Lungs: Clear to auscultation bilaterally Abd: Soft, nontender, no hepatomegaly  Ext: warm, no edema Musculoskeletal: No deformities, BUE and BLE strength normal and equal Skin: Warm and dry, no rashes   Neuro: Alert and oriented to person, place, time, and situation, CNII-XII grossly intact, no focal deficits  Psych: Normal mood and affect   Labs    Chemistry Recent Labs  Lab 06/19/23 0237 06/20/23 0245 06/21/23 0202  NA 142 142 139  K 3.6 4.5 3.9  CL 106 105 99  CO2 30 33* 33*  GLUCOSE 127* 134* 206*  BUN 10 17 24*  CREATININE 0.76 0.88 0.87  CALCIUM  8.5* 8.8* 9.0  GFRNONAA >60 >60 >60  ANIONGAP 6 4* 7     Hematology Recent Labs  Lab 06/19/23 0237 06/20/23 0245 06/21/23 0202  WBC 7.2 8.6 8.0  RBC 4.40 4.27 4.18  HGB 10.6* 10.4* 10.3*  HCT 38.2 37.9 36.4  MCV 86.8 88.8 87.1  MCH 24.1* 24.4* 24.6*  MCHC 27.7* 27.4* 28.3*  RDW 28.9* 28.7* 28.1*  PLT 270 249 263    Cardiac EnzymesNo results  for input(s): "TROPONINI" in the last 168 hours. No results for input(s): "TROPIPOC" in the last 168 hours.   BNP Recent Labs  Lab 06/18/23 1223  BNP 993.8*     DDimer No results for input(s): "DDIMER" in the last 168 hours.   Radiology    No results found.   Cardiac Studies   echo  Patient Profile     73 y.o. female with hx of oxygen  dependent COPD, mitral stenosis s/p bioprosthetic MV replacement, R carotid artery stenosis s/p endarterectomy 2012, paroxysmal atrial fibrillation, GI bleeding due to AVM, pulmonary hypertension/RV failure, type 2 DM, PAD, renal cell cancer s/p left kidney cryoablation, tobacco use,   Assessment & Plan    Acute on chronic heart failure with preserved ejection fraction  RV failure  Pulmonary hypertension  Status post MVR now with mitral stenosis - not a surgical candidate  OSA  PAF now in sinus rhythm    Clinically she is volume  overload,, making urine but still at a positive balance - I will transition to lasix  gtt for the next 24 her. Review her I&Os - her oral fluids does not look to be restricted. Please keep fluid intake to < .  Cr is normal   In sinus rhythm continue current regimen metoprolol  and warfarin   Is not a candidate for surgery in terms of her mitral valve disease - medical managements for symptoms and heart failure  Follow Dr Mclean for Pulm HTN - previous notes in her case no good options for medical treament We will continue to follow.      For questions or updates, please contact [facility] Please consult [email or web address] for contact info under Cardiology/STEMI.      Signed, Marnae Madani, DO  06/21/2023, 12:00 PM

## 2023-06-21 NOTE — TOC Initial Note (Signed)
 Transition of Care [facility]) - Initial/Assessment Note    Patient Details  Name: [name] MRN: [id number] Date of Birth: 1950/04/10  Transition of Care [facility]) CM/SW Contact:    Hilda Lovings, LCSW Phone Number: 06/21/2023, 10:58 AM  Clinical Narrative:                 CSW followed-up disposition recommendations (SNF placement).  CSW completed initial TOC work-up/assessment as noted by the following below.   CSW spoke with the patient to review SNF referral process per clinical recommendations and assessed the pt's SNF preference.   The patient expressed no preference.   The CSW contacted the patient's natural support (Sister: Lenoria Raid / [phone]) and reviewed the clinical recommendations and assess SNF preference. The natural support expressed [facility] and not [facility]  The sister expressed every other SNF referral option will be considered if the pt does not get into [facility].   CSW updated the bedside nurse and additional clinical members to the information above regarding SNF initial efforts for SNF placement.    CSW referral efforts to support the patient's disposition FL2:  PASRR:  SNF referrals:   TOC Disposition follow-up needs  Please provide the patient or natural support with bed-offer updates.  Please continue with SNF placement efforts. Please update the clinical team to SNF placement efforts:  No other needs identified by this Clinical research associate currently. Patient needs and current disposition to be followed by    Expected Discharge Plan: Skilled Nursing Facility Barriers to Discharge: Continued Medical Work up   Patient Goals and CMS Choice Patient states their goals for this hospitalization and ongoing recovery are:: To getter and retrun home   Choice offered to / list presented to : Sib[place] ownership interest in [facility]provided to:: Sibling    Expected Discharge Plan and Services     Post Acute Care Choice:  Skilled Nursing Facility Living arrangements for the past 2 months: Single Family Home                                      Prior Living Arrangements/Services Living arrangements for the past 2 months: Single Family Home Lives with:: Self Patient language and need for interpreter reviewed:: No Do you feel safe going back to the place where you live?: Yes      Need for Family Participation in Patient Care: Yes (Comment) Care giver support system in place?: Yes (comment)   Criminal Activity/Legal Involvement Pertinent to Current Situation/Hospitalization: No - Comment as needed  Activities of Daily Living   ADL Screening (condition at time of admission) Independently performs ADLs?: No Does the patient have a NEW difficulty with bathing/dressing/toileting/self-feeding that is expected to last >3 days?: No Does the patient have a NEW difficulty with getting in/out of bed, walking, or climbing stairs that is expected to last >3 days?: Yes (Initiates electronic notice to provider for possible PT consult) Does the patient have a NEW difficulty with communication that is expected to last >3 days?: No Is the patient deaf or have difficulty hearing?: Yes Does the patient have difficulty seeing, even when wearing glasses/contacts?: No Does the patient have difficulty concentrating, remembering, or making decisions?: No  Permission Sought/Granted Permission sought to share information with : Case Manager, Magazine features editor Permission granted to share information with : Yes, Release of Information Signed  Share Information with NAME: Brower,Beverly  Permission granted to share info w Relationship: Sister  Permission granted to share info w Contact Information: [phone]  Emotional Assessment   Attitude/Demeanor/Rapport: Unable to Assess Affect (typically observed): Unable to Assess Orientation: : Oriented to Self, Oriented to Place, Oriented to  Time, Oriented  to Situation Alcohol / Substance Use: Not Applicable Psych Involvement: No (comment)  Admission diagnosis:  Acute on chronic heart failure (HCC) [I50.9] Acute on chronic congestive heart failure, unspecified heart failure type [facility]) [I50.9] Patient Active Problem List   Diagnosis Date Noted   Pulmonary hypertension (HCC) 06/20/2023   Malnutrition of moderate degree 06/19/2023   Acute exacerbation of chronic obstructive pulmonary disease (COPD) (HCC) 05/20/2023   Hypertensive urgency 05/20/2023   Iron  deficiency anemia 05/19/2023   IDA (iron  deficiency anemia) 05/19/2023   Atrial fibrillation (HCC) 12/23/2022   History of atrial fibrillation 10/18/2022   Chronic anticoagulation 10/18/2022   History of fracture of left hip 10/17/2022   Generalized pain 08/01/2022   Multifactorial functional impairment 02/13/2022   History of stroke without residual deficits 01/17/2022   Diabetic peripheral neuropathy associated with type 2 diabetes mellitus (HCC) 11/11/2021   Recurrent falls 11/11/2021   Gait instability 09/19/2021   Perennial non-allergic rhinitis 09/19/2021   Chronic, continuous use of opioids for chronic pain 07/10/2021   Polypharmacy 11/08/2020   Personal history of renal cell carcinoma 09/12/2020   Tinea pedis of both feet 08/07/2020   Essential tremor 11/17/2019   Nocturnal hypoxia per sleep study 07/2019    Carotid artery stenosis; s/p R endarterectomy    COPD mixed type (HCC)    Arthritis of both hands 07/01/2019   Stage 2 chronic kidney disease due to type 2 diabetes mellitus (HCC) 12/16/2018   Lichen sclerosus of female genitalia 01/12/2017   Mixed stress and urge urinary incontinence 05/27/2016   Pulmonary hypertension due to chronic obstructive pulmonary disease (HCC) 04/25/2016   Asymptomatic cholelithiasis 09/25/2015   Nausea 07/14/2015   Chronic iron  deficiency anemia 12/26/2014   Aortic atherosclerosis (HCC) 10/19/2014   Right nephrolithiasis, asymptomatic  (incidental finding) 09/06/2014   Headache, chronic intermittent 01/14/2013   Chronic low back pain 10/06/2012   Long term current use of anticoagulant therapy 09/30/2012   Recurrent candidal intertrigo 07/28/2012   Tobacco use disorder 07/28/2012   History of colonic polyps 05/14/2011   Sleep disturbance 05/14/2011   Abdominal wall hernia 05/14/2011   Constipation due to opioid therapy 02/03/2011   Shortness of breath 12/06/2010   Health care maintenance 11/04/2010   PAF (paroxysmal atrial fibrillation) (HCC) 10/22/2010   Acute on chronic heart failure with preserved ejection fraction (HFpEF) (HCC) 10/21/2010   Fibromyalgia 08/29/2010   Gastroesophageal reflux disease    Type 2 diabetes mellitus with complication, with long-term current use of insulin  (HCC)    Osteoporosis with current pathological fracture with routine healing    Peripheral arterial disease (HCC) 05/27/2010   History of mitral valve replacement with bioprosthetic valve 2012   Essential hypertension 06/19/2008   Hyperlipidemia 11/20/2005   Severe major depression (HCC) 11/19/2005   PCP:  Sherol Dixie, MD Pharmacy:   [facility] [address] [place] [address] Phone: [phone] Fax: [phone]     Social Drivers of Health (SDOH) Social History: SDOH Screenings   Food Insecurity: No Food Insecurity (06/18/2023)  Recent Concern: Food Insecurity - Food Insecurity Present (05/25/2023)  Housing: Low Risk  (06/18/2023)  Transportation Needs: No Transportation Needs (06/18/2023)  Recent Concern: Transportation Needs - Unmet  Transportation Needs (04/08/2023)  Utilities: Not At Risk (06/18/2023)  Alcohol Screen: Low Risk  (04/08/2023)  Depression (PHQ2-9): High Risk (04/08/2023)  Financial Resource Strain: Low Risk  (04/08/2023)  Physical Activity: Inactive (04/08/2023)  Social Connections: Socially Isolated (06/18/2023)  Stress: Stress Concern Present (04/08/2023)  Tobacco  Use: High Risk (06/18/2023)  Health Literacy: Adequate Health Literacy (04/08/2023)   SDOH Interventions:     Readmission Risk Interventions     No data to display

## 2023-06-21 NOTE — Progress Notes (Signed)
 Hospital day#3 Subjective:   Summary: [name] is a 73 yo person with a history of MV disease s/p MVR now with MS for which she is not a surgical candidate, pAF on AC, RV failure admitted for acute on chronic heart failure currently on IV diuresis  Overnight Events: None   Patient feeling okay this morning. Cough much improved; no chest pain, worsening shortness of breath, or subjective swelling of abdomen and extremities. Tolerating PO. Worked with PT yesterday, and is aware of SNF need, which she agrees. Spent about 10 min discussing her limitations for MS repair per cardiology and she is appropriately worried about her prognosis. Discussed the role of palliative care and goals of care conversation. She would like to include her children and requested a consult.  Objective:  Vital signs in last 24 hours: Vitals:   06/21/23 0015 06/21/23 0154 06/21/23 0536 06/21/23 0924  BP:  (!) 172/73 (!) 146/111   Pulse: 75 76 65 79  Resp: (!) 23 19 20  (!) 29  Temp:  97.8 F (36.6 C) 97.6 F (36.4 C)   TempSrc:  Oral Oral   SpO2: 96% 97% 96% 96%  Weight:   63.8 kg   Height:       Supplemental O2: Nasal Cannula SpO2: 96 % O2 Flow Rate (L/min): 2 L/min FiO2 (%): 28 % Filed Weights   06/19/23 0537 06/20/23 0341 06/21/23 0536  Weight: 64.8 kg 64.9 kg 63.8 kg    Physical Exam:  Constitutional:Chronically ill-appearing woman in bed, in no acute distress HENT: normocephalic atraumatic, moist mucous membranes Cardiovascular: regular rate and rhythm Pulmonary/Chest: normal work of breathing, crackles throughout. No wheezing Abdominal: Normal BS, soft, non-tender MSK: No pitting edema Neurological: alert & oriented x 3 Skin: warm and dry, stable erythema in the lower extremities Psych: Pleasant mood and affect  Telemetry reviewed: rate controlled atrial fibrillation   Intake/Output Summary (Last 24 hours) at 06/21/2023 0930 Last data filed at 06/21/2023 0600 Gross per  24 hour  Intake 1200 ml  Output 1650 ml  Net -450 ml   Net IO Since Admission: -43 mL [06/21/23 0930]  Pertinent Labs:    Latest Ref Rng & Units 06/21/2023    2:02 AM 06/20/2023    2:45 AM 06/19/2023    2:37 AM  CBC  WBC 4.0 - 10.5 K/uL 8.0  8.6  7.2   Hemoglobin 12.0 - 15.0 g/dL 16.1  09.6  04.5   Hematocrit 36.0 - 46.0 % 36.4  37.9  38.2   Platelets 150 - 400 K/uL 263  249  270        Latest Ref Rng & Units 06/21/2023    2:02 AM 06/20/2023    2:45 AM 06/19/2023    2:37 AM  CMP  Glucose 70 - 99 mg/dL 409  811  914   BUN 8 - 23 mg/dL 24  17  10    Creatinine 0.44 - 1.00 mg/dL 7.82  9.56  2.13   Sodium 135 - 145 mmol/L 139  142  142   Potassium 3.5 - 5.1 mmol/L 3.9  4.5  3.6   Chloride 98 - 111 mmol/L 99  105  106   CO2 22 - 32 mmol/L 33  33  30   Calcium  8.9 - 10.3 mg/dL 9.0  8.8  8.5     Imaging: No results found.  Assessment/Plan:   Principal Problem:   Acute on chronic heart failure with preserved ejection fraction (HFpEF) (HCC)  Active Problems:   Malnutrition of moderate degree   Pulmonary hypertension (HCC)  Acute on chronic HF MV disease s/p MVR now with MS  RV failure Pulmonary hypertension Not a surgical candidate. Unmeasured output with decrease in standing weights to 140lb. Dry weight is 134lb. Discussed goals of care with patient given high risk for recurrent hospitalizations with severe cardio-pulmonary disease. She would like palliative consultation. At this time, will continue with IV diuresis guided by respiratory status and clinical improvement. Renal function within normal limits - IV lasix  80 mg BID - K >4 and Mg >2 - Patient will need further diuresis - Palliative care consulted this admission - Dispo to SNF  pAF on [facility] Intermittently afib rhythm overnight. NSR this AM. Rates <110 - INR subtherapeutic - Appreciate pharmacy assistance with medication adjustments  HTN Persistently hypertensive despite high doses of lasix . Improvement with  addition of spironolactone on 5/11.Will continue to monitor; may need up titration of ARB  - Continue to monitor - Systolic goal <180  Emphysema Chronic hypoxic respiratory On Breztri  here (Trelegy at home). No acute exacerbation during this hospitalization - Continues to require home 2L supp O2 at bedtime - Guaifenesin  for chronic bronchitis PRN - Cepacol lonzages  T2DM - Semglee  30 - SSI with meals  Iron  deficiency anemia Stable at 10.3. Has been receiving IV iron  as an outpatient with last infusion on 4/17.  - Continue to monitor  Chronic pain -Continue home Norco 5-325 two tablets q6h PRN and gabapentin  600 mg at bedtime.   Constipation On linzess  at home. Had a BM yesterday - Continue linzess  - Miralax  daily PRN  Diet: Low sodium diet VTE: Warfarin for AFib Code: Full PT/OT recs: SNF for Subacute PT,   Dispo: Anticipated discharge to Skilled nursing facility in 3 days pending clinical improvement.   [name], MD Internal Medicine Resident PGY-2 Please contact the on call pager after 5 pm and on weekends at [phone].

## 2023-06-21 NOTE — Progress Notes (Addendum)
 PHARMACY - ANTICOAGULATION CONSULT NOTE  Pharmacy Consult for warfarin Indication: atrial fibrillation  Allergies  Allergen Reactions   Dexilant [Dexlansoprazole] Other (See Comments)    Made acid reflux worse   Metoclopramide      Other Reaction(s): unknown   Isovue  [Iopamidol ] Itching and Rash   Lorazepam Other (See Comments)    Patient's sister noted that ativan caused the patient to become extremely confused during hospitalization 09/2010; tolerates Xanax    Oxycontin  [Oxycodone ] Other (See Comments)    headache   Tramadol Hcl Swelling    Ankle swelling    Patient Measurements: Height: 5\' 2"  (157.5 cm) Weight: 63.8 kg (140 lb 10.5 oz) IBW/kg (Calculated) : 50.1 HEPARIN  DW (KG): 63.3  Vital Signs: Temp: 97.6 F (36.4 C) (05/11 0536) Temp Source: Oral (05/11 0536) BP: 146/111 (05/11 0536) Pulse Rate: 65 (05/11 0536)  Labs: Recent Labs    06/18/23 1223 06/18/23 1606 06/19/23 0237 06/20/23 0245 06/21/23 0202  HGB 11.3*   < > 10.6* 10.4* 10.3*  HCT 41.4   < > 38.2 37.9 36.4  PLT 305   < > 270 249 263  LABPROT  --    < > 22.4* 20.6* 18.8*  INR  --    < > 1.9* 1.7* 1.6*  CREATININE 0.72   < > 0.76 0.88 0.87  TROPONINIHS 16  --   --   --   --    < > = values in this interval not displayed.    Estimated Creatinine Clearance: 49.8 mL/min (by C-G formula based on SCr of 0.87 mg/dL).   Assessment: 73 YO female with PMH significant for R carotid artery stenosis s/p endarterectomy, bioMVR and atrial fibrillation on warfarin PTA who is admitted for eleavted BNP and BLE edema.  Pharmacy consulted to manage warfarin. INR 1.8 on admission, last warfarin dose 5/07 per patient. Per chart review, patient "resistant to a DOAC because she hasn't understood that these new medications are indicated as alternatives" back in 2012.  Will perform benefits investigation and inquire about willingness to switch to DOAC.  PTA warfarin regimen: 1.25 mg Mon, 2.5 mg all other days per 5/6  anticoag visit (2.5 mg tablets) *5/6 INR = 3 on 3.75 mg Fri, 2.5 mg AOD (reduced to above regimen)  Notable DDI's: none  INR 1.6, remains subtherapeutic.  Hgb 10s, pltc WNL.  Goal of Therapy:  INR 2-2.5 Monitor platelets by anticoagulation protocol: Yes   Plan:  Give warfarin 3 mg PO x1 dose Check INR daily while on warfarin Continue to monitor H&H and platelets   Thank you for allowing pharmacy to be a part of this patient's care.  Harvest Lineman, PharmD PGY1 Pharmacy Resident

## 2023-06-21 NOTE — Plan of Care (Signed)
     Referral received for [name]: goals of care discussion. Chart reviewed and updates received from attending team. Patient assessed. Patient shares her family (daughter and sister) are unable to meet today. I will have a PMT colleague reach out to patient to set up a goals of care meeting.  Detailed note and recommendations to follow once GOC has been completed.   Thank you for your referral and allowing PMT to assist in [facility] [name]'s care.   Joaquim Muir, NP Palliative Medicine Team  Team Phone # [phone]   NO CHARGE

## 2023-06-21 NOTE — Progress Notes (Signed)
Pt. Refused cpap. 

## 2023-06-22 ENCOUNTER — Telehealth (HOSPITAL_COMMUNITY): Payer: Self-pay | Admitting: Pharmacy Technician

## 2023-06-22 ENCOUNTER — Other Ambulatory Visit (HOSPITAL_COMMUNITY): Payer: Self-pay

## 2023-06-22 DIAGNOSIS — I509 Heart failure, unspecified: Secondary | ICD-10-CM

## 2023-06-22 DIAGNOSIS — Z515 Encounter for palliative care: Secondary | ICD-10-CM

## 2023-06-22 DIAGNOSIS — R531 Weakness: Secondary | ICD-10-CM

## 2023-06-22 DIAGNOSIS — Z7189 Other specified counseling: Secondary | ICD-10-CM | POA: Diagnosis not present

## 2023-06-22 DIAGNOSIS — R0602 Shortness of breath: Secondary | ICD-10-CM | POA: Diagnosis not present

## 2023-06-22 DIAGNOSIS — I272 Pulmonary hypertension, unspecified: Secondary | ICD-10-CM | POA: Diagnosis not present

## 2023-06-22 DIAGNOSIS — I5033 Acute on chronic diastolic (congestive) heart failure: Secondary | ICD-10-CM | POA: Diagnosis not present

## 2023-06-22 LAB — BASIC METABOLIC PANEL WITH GFR
Anion gap: 12 (ref 5–15)
BUN: 26 mg/dL — ABNORMAL HIGH (ref 8–23)
CO2: 32 mmol/L (ref 22–32)
Calcium: 9.4 mg/dL (ref 8.9–10.3)
Chloride: 96 mmol/L — ABNORMAL LOW (ref 98–111)
Creatinine, Ser: 0.98 mg/dL (ref 0.44–1.00)
GFR, Estimated: >60 mL/min (ref >60)
Glucose, Bld: 248 mg/dL — ABNORMAL HIGH (ref 70–99)
Potassium: 3.9 mmol/L (ref 3.5–5.1)
Sodium: 140 mmol/L (ref 135–145)

## 2023-06-22 LAB — CBC
HCT: 42.9 % (ref 36.0–46.0)
Hemoglobin: 12 g/dL (ref 12.0–15.0)
MCH: 24.3 pg — ABNORMAL LOW (ref 26.0–34.0)
MCHC: 28 g/dL — ABNORMAL LOW (ref 30.0–36.0)
MCV: 87 fL (ref 80.0–100.0)
Platelets: 283 10*3/uL (ref 150–400)
RBC: 4.93 MIL/uL (ref 3.87–5.11)
RDW: 28.7 % — ABNORMAL HIGH (ref 11.5–15.5)
WBC: 8.7 10*3/uL (ref 4.0–10.5)
nRBC: 0 % (ref 0.0–0.2)

## 2023-06-22 LAB — GLUCOSE, CAPILLARY
Glucose-Capillary: 201 mg/dL — ABNORMAL HIGH (ref 70–99)
Glucose-Capillary: 152 mg/dL — ABNORMAL HIGH (ref 70–99)
Glucose-Capillary: 181 mg/dL — ABNORMAL HIGH (ref 70–99)
Glucose-Capillary: 317 mg/dL — ABNORMAL HIGH (ref 70–99)

## 2023-06-22 LAB — PROTIME-INR
INR: 1.4 — ABNORMAL HIGH (ref 0.8–1.2)
Prothrombin Time: 17.3 s — ABNORMAL HIGH (ref 11.4–15.2)

## 2023-06-22 LAB — MAGNESIUM: Magnesium: 2.1 mg/dL (ref 1.7–2.4)

## 2023-06-22 MED ORDER — POTASSIUM CHLORIDE CRYS ER 20 MEQ PO TBCR
40.0000 meq | EXTENDED_RELEASE_TABLET | Freq: Two times a day (BID) | ORAL | Status: AC
  Administered 2023-06-22 (×2): 40 meq via ORAL
  Filled 2023-06-22 (×2): qty 2

## 2023-06-22 MED ORDER — ACETAMINOPHEN 500 MG PO TABS
1000.0000 mg | ORAL_TABLET | Freq: Once | ORAL | Status: DC
  Filled 2023-06-22: qty 2

## 2023-06-22 MED ORDER — WARFARIN SODIUM 5 MG PO TABS
5.0000 mg | ORAL_TABLET | Freq: Once | ORAL | Status: AC
  Administered 2023-06-22: 5 mg via ORAL
  Filled 2023-06-22: qty 1

## 2023-06-22 MED ORDER — INSULIN GLARGINE-YFGN 100 UNIT/ML ~~LOC~~ SOLN
36.0000 [IU] | Freq: Every day | SUBCUTANEOUS | Status: DC
  Administered 2023-06-23 – 2023-06-28 (×6): 36 [IU] via SUBCUTANEOUS
  Filled 2023-06-22 (×6): qty 0.36

## 2023-06-22 MED ORDER — KETOROLAC TROMETHAMINE 15 MG/ML IJ SOLN
15.0000 mg | Freq: Once | INTRAMUSCULAR | Status: AC
  Administered 2023-06-22: 15 mg via INTRAVENOUS
  Filled 2023-06-22: qty 1

## 2023-06-22 NOTE — Plan of Care (Signed)

## 2023-06-22 NOTE — Progress Notes (Signed)
 Subjective HA has been bothersome in the last few days. No chest pain. SOB improved since admission but still bothersome.  Physical exam Blood pressure (!) 152/54, pulse 80, temperature 98.6 F (37 C), temperature source Oral, resp. rate 20, height 5\' 2"  (1.575 m), weight 62 kg, SpO2 95%.  Constitutional: sitting up in bed, in no acute distress Cardiovascular: regular rate and rhythm, no m/r/g, mild bilateral LEE (improving) Pulmonary/Chest: normal work of breathing on 2 L [place], lungs CTAB Neurological: alert & oriented Skin: warm and dry, bilateral LE erythema consistent with venous stasis dermatitis  Weight change: -1.8 kg   Intake/Output Summary (Last 24 hours) at 06/22/2023 0933 Last data filed at 06/22/2023 0859 Gross per 24 hour  Intake 678.74 ml  Output 5050 ml  Net -4371.26 ml   Net IO Since Admission: -3,934.26 mL [06/22/23 0933]  Labs, images, and other studies    Latest Ref Rng & Units 06/22/2023    2:36 AM 06/21/2023    2:02 AM 06/20/2023    2:45 AM  CBC  WBC 4.0 - 10.5 K/uL 8.7  8.0  8.6   Hemoglobin 12.0 - 15.0 g/dL 16.1  09.6  04.5   Hematocrit 36.0 - 46.0 % 42.9  36.4  37.9   Platelets 150 - 400 K/uL 283  263  249        Latest Ref Rng & Units 06/22/2023    2:36 AM 06/21/2023    2:02 AM 06/20/2023    2:45 AM  BMP  Glucose 70 - 99 mg/dL 409  811  914   BUN 8 - 23 mg/dL 26  24  17    Creatinine 0.44 - 1.00 mg/dL 7.82  9.56  2.13   Sodium 135 - 145 mmol/L 140  139  142   Potassium 3.5 - 5.1 mmol/L 3.9  3.9  4.5   Chloride 98 - 111 mmol/L 96  99  105   CO2 22 - 32 mmol/L 32  33  33   Calcium  8.9 - 10.3 mg/dL 9.4  9.0  8.8      Assessment and plan Hospital day 4  [name] is a 73 y.o.female pertinent PMH of R carotid artery stenosis s/p endarterectomy, s/p MVR, HFpEF, pAF on warfarin, tobacco use disorder, T2DM, and COPD/emphysema on 2 L [place] at bedtime, who [facility] the [facility] with bilateral lower extremity edema and admitted  for HFpEF exacerbation.    Acute on chronic HFpEF Pulmonary hypertension Cardiology following, appreciate recommendations. Continues on Lasix  gtt at 8 mg/hr with good output.  -Continue Losartan  50, Toprol -XL 25, and Aldactone 50. Cardiology considering Entresto/Jardiance  and pharmacy looking into cost. - Strict I&Os, daily weights - Daily BMP, Mg   Paroxysmal afib S/P mitral valve repair Hypertension Meds per above. Warfarin per pharmacy for MV stenosis s/p bioprothetic valve (2012). INR remains subtherapeutic. BP elevated but looks better today.  -Consider up-titration of Losartan    Emphysema Chronic hypoxic respiratory On Breztri  here (Trelegy at home) and DuoNebs prn. - Maintain SpO2 between 88-92% - Incentive spirometry  - Consider pulmonary rehab outpatient   T2DM A1c on admission is 5.9. CBG's above goal. -Increase Semglee  to 36 units daily + SSI    Depression - Continue venlafaxine , wellbutrin , ramelteon , and xanax    Iron  deficiency anemia Improved to 12.0 today. Has been receiving IV iron  as an outpatient with last infusion on 4/17.  - Daily CBC  Chronic pain Neuralgia -Continue home Norco 5-325 two tablets q6h PRN and gabapentin  600 mg at bedtime.    Constipation - Continue linzess  - Miralax  daily PRN  Goals of Care Palliative has been consulted, pending.   Ronni Colace, DO 06/22/2023, 9:33 AM  Pager: [phone] After 5pm or weekend: [phone]

## 2023-06-22 NOTE — Progress Notes (Signed)
 PHARMACY - ANTICOAGULATION CONSULT NOTE  Pharmacy Consult for warfarin management. Indication: atrial fibrillation  Allergies  Allergen Reactions   Dexilant [Dexlansoprazole] Other (See Comments)    Made acid reflux worse   Metoclopramide      Other Reaction(s): unknown   Isovue  [Iopamidol ] Itching and Rash   Lorazepam Other (See Comments)    Patient's sister noted that ativan caused the patient to become extremely confused during hospitalization 09/2010; tolerates Xanax    Oxycontin  [Oxycodone ] Other (See Comments)    headache   Tramadol Hcl Swelling    Ankle swelling    Patient Measurements: Height: 5\' 2"  (157.5 cm) Weight: 62 kg (136 lb 11 oz) IBW/kg (Calculated) : 50.1 HEPARIN  DW (KG): 63.3  Vital Signs: Temp: 98.6 F (37 C) (05/12 0751) Temp Source: Oral (05/12 0751) BP: 152/54 (05/12 0751) Pulse Rate: 86 (05/12 0751)  Labs: Recent Labs    06/20/23 0245 06/21/23 0202 06/22/23 0236  HGB 10.4* 10.3* 12.0  HCT 37.9 36.4 42.9  PLT 249 263 283  LABPROT 20.6* 18.8* 17.3*  INR 1.7* 1.6* 1.4*  CREATININE 0.88 0.87 0.98    Estimated Creatinine Clearance: 43.6 mL/min (by C-G formula based on SCr of 0.98 mg/dL).   Medical History: Past Medical History:  Diagnosis Date   Abnormality of lung on CXR 02/02/2020   Nonspecific finding on CXR ordered by pulmonologist - c/w inflammation vs infection, f/u imaging suggested, Dr. Antonette Batters office has been in communication about recommended next steps.   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   Non-bleeding when visualized on capsule endoscopy 06/30/2015    Arthritis    "lower back; hands" (02/19/2018)   Asthma    Asymptomatic cholelithiasis 09/25/2015   Seen on CT scan 08/2015   Carotid artery stenosis; s/p R endarterectomy    s/p right endarterectomy (06/2010) Carotid US  (07/2010):  Left:  Moderate-to-severe (60-79%) calcific and non-calcific plaque origin and proximal ICA and ECA    Chronic congestive heart failure with left ventricular diastolic dysfunction (HCC) 10/21/2010   Chronic constipation 02/03/2011   Chronic daily headache 01/16/2014   Chronic iron  deficiency anemia    Chronic low back pain 10/06/2012   Chronic venous insufficiency 08/04/2012   Closed fracture of one rib of left side 08/23/2021   ED visit after a fall 08/16/21    COPD (chronic obstructive pulmonary disease) with emphysema (HCC)    PFTs 2018: severe obstructive disease, insignif response to bronchiodilator, mild restriction parenchymal pattern, moderately severe diffusion defect. 2014  FEV1 0.92 (40%), ratio 69, 27% increase in FEV1 with BD, TLC 91%, severe airtrapping, DLCO49% On chronic home O2. Pulmonary rehab referral 05/2012    Dyspnea    Fibromyalgia 08/29/2010   Gastroesophageal reflux disease    History of blood transfusion    "several times"  (02/19/2018)   History of clear cell renal cell carcinoma (HCC), in remission 07/21/2011   s/p cryoablation of left RCC in 09/2011 by Dr. Nereida Banning. Followed by Dr. Joie Narrow  [facility]) .     History of fracture of left hip 10/17/2022   History of hiatal hernia    History of mitral valve replacement with bioprosthetic valve due to mitral stenosis 2012   s/p MVR with a 27-mm pericardial porcine valve (Medtronic Mosaic valve, serial #[id number] on 09/20/10, Dr. Matt Song)    History of obstructive sleep apnea, resolved 2013   resolved per sleep study 07/2019; no  apnea, but did have desaturation.  CPAP no longer necessary.  Nocturnal polysomnography (06/2009): Moderate sleep apnea/ hypopnea syndrome , AHI 17.8 per hour with nonpositional hypopneas. CPAP titration to 12 CWP, AHI 2.4 per hour. On nocturnal CPAP via a small resMed Quattro full-face mask with heated humidifier.    History of pneumonia    "once"  (02/19/2018)   History of seborrheic keratosis  09/28/2015   History of stroke without residual deficits 01/17/2022   Hyperlipidemia LDL goal < 100 11/20/2005   Internal and external hemorrhoids without complication 08/04/2012   Lesion of left native kidney 06/01/2020   Incidental finding on recent screening chest CT for lung ca ordered by Dr. Linder Revere pulmonology - he has ordered a f/u renal U/S.    Lichen sclerosus of female genitalia 01/12/2017   Migraine    "none in years" (02/19/2018)   Mild cognitive impairment with memory loss 12/23/2021   Moderately severe major depression (HCC) 11/19/2005   Nocturnal hypoxia per sleep study 07/2019    Osteoporosis    DEXA 2016: T -2.7; DEXA (12/09/2011): L-spine T -3.7, left hip T -1.4 DEXA (12/2004): L-spine T -2.6, left hip -0.1    Paroxysmal atrial fibrillation (HCC) 10/22/2010   s/p Left atrial maze procedure for paroxysmal atrial fibrillation on 09/20/2010 by Dr Matt Song.  Subsequent splenic infarct, decision was made to re-anticoagulate with coumadin , likely life-long as this is the most likely cause of the splenic infarct.    Personal history of colonic polyps 05/14/2011   Colonoscopy (05/2011): 4 mm adenomatous polyp excised endoscopically Colonoscopy (02/2002): Adenomatous polyp excised endoscopically    Personal history of renal cell carcinoma 09/12/2020   Pneumonia    Pulmonary hypertension due to chronic obstructive pulmonary disease (HCC) 04/25/2016   2014 TEE w PA peak pressure 46 mmHg, s/p MV replacement    Right nephrolithiasis, asymptomatic, incidental finding 09/06/2014   5 mm non-obstructing calculus seen on CT scan 09/05/2014    Right ventricular failure (HCC) 04/25/2016   Severe obesity (BMI 35.0-39.9) with comorbidity (HCC) 10/23/2011   Sleep apnea    Stage 2 chronic kidney disease due to type 2 diabetes mellitus (HCC) 12/16/2018   CKD stage III     Tobacco abuse 07/28/2012   Type 2 diabetes mellitus with complication, with long-term current use of insulin  (HCC)    Type 2  diabetes mellitus with diabetic neuropathy (HCC)    Weight loss due to medication 03/12/2023     Assessment: Patient known to [facility] as a patient-self tester with at home finger-stick, point of care device for which she has a targeted INR range of 2.0 - 2.5 as per her PCP, Dr. Concha Deed A. Williams. Before admission to hospital, her INR had been therapeutic. Today, her INR is 1.4 at 0236 hours. Last documented Albumin  is 3.7 from 05/19/23. She is receiving ENSURE MAX as a nutritional supplement that provides 20% recommended daily allowance of vitamin K1  ~ 30 micrograms per serving. Given that this is "new" relative to her home diet, I.e. an increased amount of vitamin K1 , this could possibly explain her recalcitrance to what had been her home regimen of warfarin.   Goal of Therapy:  INR target range 2.0 - 2.5    Plan:  Given that her INR is < goal, will escalate dosing. Will administer 5 mg warfarin PO today.   Kenda Paula, PharmD, CPP Clinical Pharmacist Practitioner 06/22/2023,8:12 AM

## 2023-06-22 NOTE — Progress Notes (Signed)
 Heart Failure Navigator Progress Note  Assessed for Heart & Vascular TOC clinic readiness.  Patient does not meet criteria due to established with advanced heart failure clinic, patient of Dr. Mitzie Anda.   Navigator will sign off.   Jerilyn Monte, PharmD, BCPS Heart Failure Stewardship Pharmacist Phone [phone]

## 2023-06-22 NOTE — TOC Progression Note (Signed)
 Transition of Care [facility]) - Progression Note    Patient Details  Name: [name] MRN: [id number] Date of Birth: 1950-06-26  Transition of [facility]) CM/SW Contact  Arron Big, [place] Phone Number: 06/22/2023, 11:10 AM  Clinical Narrative:   CSW met patient at bedside and provided medicare.gov bed offers for SNF. Patient stated the only facility she wants to go to is [place] [facility]; [name] has lack of bed availability at this time and has not accepted patient. CSW notified treatment team.   TOC will continue to follow.    Expected Discharge Plan: Skilled Nursing Facility Barriers to Discharge: Continued Medical Work up  Expected Discharge Plan and Services     Post Acute Care Choice: Skilled Nursing Facility Living arrangements for the past 2 months: Single Family Home                                       Social Determinants of Health (SDOH) Interventions SDOH Screenings   Food Insecurity: No Food Insecurity (06/18/2023)  Recent Concern: Food Insecurity - Food Insecurity Present (05/25/2023)  Housing: Low Risk  (06/18/2023)  Transportation Needs: No Transportation Needs (06/18/2023)  Recent Concern: Transportation Needs - Unmet Transportation Needs (04/08/2023)  Utilities: Not At Risk (06/18/2023)  Alcohol Screen: Low Risk  (04/08/2023)  Depression (PHQ2-9): High Risk (04/08/2023)  Financial Resource Strain: Low Risk  (04/08/2023)  Physical Activity: Inactive (04/08/2023)  Social Connections: Socially Isolated (06/18/2023)  Stress: Stress Concern Present (04/08/2023)  Tobacco Use: High Risk (06/18/2023)  Health Literacy: Adequate Health Literacy (04/08/2023)    Readmission Risk Interventions     No data to display

## 2023-06-22 NOTE — Telephone Encounter (Signed)
 Patient Product/process development scientist completed.    The patient is insured through [employer]/ [employer]. Patient has Medicare and is not eligible for a copay card, but may be able to apply for patient assistance or Medicare RX Payment Plan (Patient Must reach out to their plan, if eligible for payment plan), if available.    Ran test claim for Entresto 24-26 mg and the current 30 day co-pay is $0.00.  Ran test claim for Farxiga 10 mg and the current 30 day co-pay is $0.00.  Ran test claim for spironolactone 25 mg and the current 30 day co-pay is $0.00.  This test claim was processed through [facility]- copay amounts may vary at other pharmacies due to pharmacy/plan contracts, or as the patient moves through the different stages of their insurance plan.     Morgan Arab, CPHT Pharmacy Technician III Certified Patient Advocate [facility] Patient Advocate Team Direct Number: [phone]  Fax: [phone]

## 2023-06-22 NOTE — Consult Note (Signed)
 Consultation Note Date: 06/22/2023   Patient Name: [name]  DOB: Sep 14, 1950  MRN: [id number]  Age / Sex: 73 y.o., female  PCP: Sherol Dixie, MD Referring Physician: Driscilla George, MD  Reason for Consultation: Establishing goals of care  HPI/Patient Profile: 73 y.o. female   admitted on 06/18/2023 with past medical history significant of R carotid artery stenosis s/p endarterectomy, s/p MVR w/ bioprosthetic MV, HFpEF, pAF, and emphysema p/w BLE edema    Pt reports continued physical and functional decline over many months.   She reports having increased SOB, BLE edema to calves, and 2 pillow orthopnea.  She lives alone with support of her sister and daughter.   Patient faces treatment option decisions, advanced directive decisions and anticipatory care needs.    Clinical Assessment and Goals of Care:  This NP [name] reviewed medical records, received report from team, assessed the patient and then meet at the patient's bedside  along with her sister/  [name] and daughter/ [name]  to discuss diagnosis, prognosis, GOC, EOL wishes disposition and options.   Concept of Palliative Care was introduced as specialized medical care for people and their families living with serious illness.  If focuses on providing relief from the symptoms and stress of a serious illness.  The goal is to improve quality of life for both the patient and the family.  Values and goals of care important to patient and family were attempted to be elicited.  Created space and opportunity for patient  and family to explore thoughts and feelings regarding current medical situation. Family is very concerned that patient should not be living alone.  They have encouraged patient to make some important decisions for months regarding her current living situation specific to her declining health.  She is very  indecisive.      A  discussion was had today regarding advanced directives.  Concepts specific to code status, artifical feeding and hydration, continued IV antibiotics and rehospitalization was had.   The difference between a aggressive medical intervention path  and a palliative comfort care path for this patient at this time was had.     MOST form introduced, Hard Choices booklet left for review.  Patient and family were appreciative of today discussion and will continue to discuss amongst  themselves   Questions and concerns addressed.  Patient/family  encouraged to call with questions or concerns.     PMT will continue to support holistically.             NEXT OF KIN-both patient's daughter and sister would work to gather from medical decisions in the event the patient cannot make her own decisions.    SUMMARY OF RECOMMENDATIONS    Code Status/Advance Care Planning: Full code Educated patient/family to consider DNR/DNI status understanding evidenced based poor outcomes in similar hospitalized patient, as the cause of arrest is likely associated with advanced chronic illness rather than an easily reversible acute cardio-pulmonary event.   Symptom Management:  Per sttending  Palliative Prophylaxis:  Bowel Regimen, Delirium Protocol, and Frequent Pain Assessment  Additional Recommendations (Limitations, Scope, Preferences): Full Scope Treatment  Psycho-social/Spiritual:  Desire for further Chaplaincy support:yes Additional Recommendations: directed to [facility] for legal advise   Prognosis:  Unable to determine  Discharge Planning:  Patient is hopeful for SNF for rehab      Primary Diagnoses: Present on Admission:  Acute on chronic heart failure with preserved ejection fraction (HFpEF) (HCC)   I have reviewed the medical record, interviewed the patient and family, and examined the patient. The following aspects are pertinent.  Past Medical  History:  Diagnosis Date   Abnormality of lung on CXR 02/02/2020   Nonspecific finding on CXR ordered by pulmonologist - c/w inflammation vs infection, f/u imaging suggested, Dr. Antonette Batters office has been in communication about recommended next steps.   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   Non-bleeding when visualized on capsule endoscopy 06/30/2015    Arthritis    "lower back; hands" (02/19/2018)   Asthma    Asymptomatic cholelithiasis 09/25/2015   Seen on CT scan 08/2015   Carotid artery stenosis; s/p R endarterectomy    s/p right endarterectomy (06/2010) Carotid US  (07/2010):  Left: Moderate-to-severe (60-79%) calcific and non-calcific plaque origin and proximal ICA and ECA    Chronic congestive heart failure with left ventricular diastolic dysfunction (HCC) 10/21/2010   Chronic constipation 02/03/2011   Chronic daily headache 01/16/2014   Chronic iron  deficiency anemia    Chronic low back pain 10/06/2012   Chronic venous insufficiency 08/04/2012   Closed fracture of one rib of left side 08/23/2021   ED visit after a fall 08/16/21    COPD (chronic obstructive pulmonary disease) with emphysema (HCC)    PFTs 2018: severe obstructive disease, insignif response to bronchiodilator, mild restriction parenchymal pattern, moderately severe diffusion defect. 2014  FEV1 0.92 (40%), ratio 69, 27% increase in FEV1 with BD, TLC 91%, severe airtrapping, DLCO49% On chronic home O2. Pulmonary rehab referral 05/2012    Dyspnea    Fibromyalgia 08/29/2010   Gastroesophageal reflux disease    History of blood transfusion    "several times"  (02/19/2018)   History of clear cell renal cell carcinoma (HCC), in remission 07/21/2011   s/p cryoablation of left RCC in 09/2011 by Dr. Nereida Banning. Followed by Dr. Joie Narrow  [facility]) .     History of fracture of left hip  10/17/2022   History of hiatal hernia    History of mitral valve replacement with bioprosthetic valve due to mitral stenosis 2012   s/p MVR with a 27-mm pericardial porcine valve (Medtronic Mosaic valve, serial #[id number] on 09/20/10, Dr. Matt Song)    History of obstructive sleep apnea, resolved 2013   resolved per sleep study 07/2019; no apnea, but did have desaturation.  CPAP no longer necessary.  Nocturnal polysomnography (06/2009): Moderate sleep apnea/ hypopnea syndrome , AHI 17.8 per hour with nonpositional hypopneas. CPAP titration to 12 CWP, AHI 2.4 per hour. On nocturnal CPAP via a small resMed Quattro full-face mask with heated humidifier.    History of pneumonia    "once"  (02/19/2018)   History of seborrheic keratosis 09/28/2015   History of stroke without residual deficits 01/17/2022   Hyperlipidemia LDL goal < 100 11/20/2005   Internal and external hemorrhoids without complication 08/04/2012   Lesion of left native kidney 06/01/2020   Incidental finding on recent screening chest CT  for lung ca ordered by Dr. Linder Revere pulmonology - he has ordered a f/u renal U/S.    Lichen sclerosus of female genitalia 01/12/2017   Migraine    "none in years" (02/19/2018)   Mild cognitive impairment with memory loss 12/23/2021   Moderately severe major depression (HCC) 11/19/2005   Nocturnal hypoxia per sleep study 07/2019    Osteoporosis    DEXA 2016: T -2.7; DEXA (12/09/2011): L-spine T -3.7, left hip T -1.4 DEXA (12/2004): L-spine T -2.6, left hip -0.1    Paroxysmal atrial fibrillation (HCC) 10/22/2010   s/p Left atrial maze procedure for paroxysmal atrial fibrillation on 09/20/2010 by Dr Matt Song.  Subsequent splenic infarct, decision was made to re-anticoagulate with coumadin , likely life-long as this is the most likely cause of the splenic infarct.    Personal history of colonic polyps 05/14/2011   Colonoscopy (05/2011): 4 mm adenomatous polyp excised endoscopically Colonoscopy (02/2002):  Adenomatous polyp excised endoscopically    Personal history of renal cell carcinoma 09/12/2020   Pneumonia    Pulmonary hypertension due to chronic obstructive pulmonary disease (HCC) 04/25/2016   2014 TEE w PA peak pressure 46 mmHg, s/p MV replacement    Right nephrolithiasis, asymptomatic, incidental finding 09/06/2014   5 mm non-obstructing calculus seen on CT scan 09/05/2014    Right ventricular failure (HCC) 04/25/2016   Severe obesity (BMI 35.0-39.9) with comorbidity (HCC) 10/23/2011   Sleep apnea    Stage 2 chronic kidney disease due to type 2 diabetes mellitus (HCC) 12/16/2018   CKD stage III     Tobacco abuse 07/28/2012   Type 2 diabetes mellitus with complication, with long-term current use of insulin  (HCC)    Type 2 diabetes mellitus with diabetic neuropathy (HCC)    Weight loss due to medication 03/12/2023   Social History   Socioeconomic History   Marital status: Divorced    Spouse name: Not on file   Number of children: Not on file   Years of education: Not on file   Highest education level: Not on file  Occupational History   Not on file  Tobacco Use   Smoking status: Every Day    Current packs/day: 1.00    Average packs/day: 1 pack/day for 60.6 years (60.6 ttl pk-yrs)    Types: Cigarettes    Start date: 11/01/1962   Smokeless tobacco: Never   Tobacco comments:    At least 1 PPD  Vaping Use   Vaping status: Never Used  Substance and Sexual Activity   Alcohol use: Not Currently    Comment: 02/19/2018 "nothing since 1999"   Drug use: Not Currently    Types: Marijuana    Comment: 02/19/2018 "nothing since the 1990s"   Sexual activity: Not Currently    Birth control/protection: Post-menopausal  Other Topics Concern   Not on file  Social History Narrative   L[place] ([address])   Worked at [employer] for 18 years   No car   Social Drivers of Corporate investment banker Strain: Low Risk  (04/08/2023)   Overall Financial  Resource Strain (CARDIA)    Difficulty of Paying Living Expenses: Not hard at all  Food Insecurity: No Food Insecurity (06/18/2023)   Hunger Vital Sign    Worried About Running Out of Food in the Last Year: Never true    Ran Out of Food in the Last Year: Never true  Recent Concern: Food Insecurity - Food Insecurity Present (05/25/2023)   Hunger Vital Sign  Worried About Programme researcher, broadcasting/film/video in the Last Year: Never true    Ran Out of Food in the Last Year: Sometimes true  Transportation Needs: No Transportation Needs (06/18/2023)   PRAPARE - Administrator, Civil Service (Medical): No    Lack of Transportation (Non-Medical): No  Recent Concern: Transportation Needs - Unmet Transportation Needs (04/08/2023)   PRAPARE - Transportation    Lack of Transportation (Medical): Yes    Lack of Transportation (Non-Medical): Yes  Physical Activity: Inactive (04/08/2023)   Exercise Vital Sign    Days of Exercise per Week: 0 days    Minutes of Exercise per Session: 0 min  Stress: Stress Concern Present (04/08/2023)   [employer] of Occupational Health - Occupational Stress Questionnaire    Feeling of Stress : Rather much  Social Connections: Socially Isolated (06/18/2023)   Social Connection and Isolation Panel [NHANES]    Frequency of Communication with Friends and Family: More than three times a week    Frequency of Social Gatherings with Friends and Family: More than three times a week    Attends Religious Services: Never    Database administrator or Organizations: No    Attends Engineer, structural: Never    Marital Status: Divorced   Family History  Problem Relation Age of Onset   Peptic Ulcer Disease Father    Heart attack Father 19       Died of MI at age 97   Heart attack Brother 72       Died of MI at age 73   Obesity Brother    Pneumonia Mother    Healthy Sister    Lupus Daughter    Obsessive Compulsive Disorder Daughter    Scheduled Meds:  ALPRAZolam   0.5  mg Oral QHS   budeson-glycopyrrolate -formoterol   2 puff Inhalation BID   buPROPion   150 mg Oral BH-q7a   gabapentin   600 mg Oral QHS   insulin  aspart  0-15 Units Subcutaneous TID WC   [START ON 06/23/2023] insulin  glargine-yfgn  36 Units Subcutaneous Daily   ipratropium-albuterol   3 mL Nebulization BID   linaclotide   72 mcg Oral QAC breakfast   losartan   50 mg Oral Daily   metoprolol  succinate  25 mg Oral Daily   multivitamin with minerals  1 tablet Oral Daily   nicotine   14 mg Transdermal Daily   pantoprazole   40 mg Oral Daily   potassium chloride   40 mEq Oral BID   Ensure Max Protein  11 oz Oral BID   ramelteon   8 mg Oral QHS   spironolactone   50 mg Oral Daily   venlafaxine  XR  150 mg Oral Q breakfast   warfarin  5 mg Oral ONCE-1600   Warfarin - Pharmacist Dosing Inpatient   Does not apply q1600   Continuous Infusions:  furosemide  (LASIX ) 200 mg in dextrose  5 % 100 mL (2 mg/mL) infusion 8 mg/hr (06/22/23 0859)   PRN Meds:.guaiFENesin , HYDROcodone -acetaminophen , menthol -cetylpyridinium, ondansetron  (ZOFRAN ) IV, polyethylene glycol Medications Prior to Admission:  Prior to Admission medications   Medication Sig Start Date End Date Taking? Authorizing Provider  albuterol  (VENTOLIN  HFA) 108 (90 Base) MCG/ACT inhaler Inhale 2 puffs into the lungs every 6 hours as needed for shortness of breath 01/21/23  Yes Sherol Dixie, MD  ALPRAZolam  (XANAX ) 0.5 MG tablet Take 1 tablet (0.5 mg total) by mouth at bedtime. To help with sleep. 06/05/23  Yes Sherol Dixie, MD  buPROPion  (WELLBUTRIN  XL) 150 MG  24 hr tablet Take 1 tablet (150 mg total) by mouth every morning. 12/24/22  Yes Sherol Dixie, MD  DULoxetine  (CYMBALTA ) 60 MG capsule Take 60 mg by mouth 2 (two) times daily.   Yes [provider]  Fluticasone -Umeclidin-Vilant (TRELEGY ELLIPTA ) 100-62.5-25 MCG/ACT AEPB Inhale 1 puff into the lungs daily. 03/22/23  Yes Lind Repine, MD  furosemide  (LASIX ) 40 MG tablet  Take 0.5 tablets (20 mg total) by mouth daily. Patient taking differently: Take 40 mg by mouth daily. 04/07/23  Yes Sherol Dixie, MD  gabapentin  (NEURONTIN ) 600 MG tablet Take 1 tablet (600 mg total) by mouth at bedtime. 01/06/23  Yes Sherol Dixie, MD  HYDROcodone -acetaminophen  (NORCO/VICODIN) 5-325 MG tablet Take 2 tablets by mouth 2 (two) times daily. 06/05/23  Yes Sherol Dixie, MD  insulin  degludec (TRESIBA ) 100 UNIT/ML FlexTouch Pen Inject 30 Units into the skin every morning. 04/30/23  Yes Sherol Dixie, MD  ipratropium (ATROVENT ) 0.03 % nasal spray Instill 2 sprays into the nose 3 (three) times daily as directed 02/18/23  Yes Sherol Dixie, MD  ipratropium-albuterol  (DUONEB) 0.5-2.5 (3) MG/3ML SOLN Inhale 3 mLs by nebulization every 6 (six) hours as needed (shortness of breath, wheezing). 04/03/22  Yes Sherol Dixie, MD  linaclotide  (LINZESS ) 72 MCG capsule Take 1 capsule (72 mcg total) by mouth daily before breakfast. 12/15/22 12/10/23 Yes Sherol Dixie, MD  losartan  (COZAAR ) 50 MG tablet Take 1 tablet (50 mg total) by mouth daily. 05/23/23  Yes Maxie Spaniel, MD  metoprolol  succinate (TOPROL  XL) 25 MG 24 hr tablet Take 1 tablet (25 mg total) by mouth daily. 04/07/23  Yes Sherol Dixie, MD  omeprazole  (PRILOSEC) 40 MG capsule Take 1 capsule (40 mg total) by mouth 2 (two) times daily. 01/29/23  Yes Sherol Dixie, MD  ondansetron  (ZOFRAN ) 4 MG tablet Take 1 tablet (4 mg total) by mouth daily as needed for nausea or vomiting. 05/26/23 05/25/24 Yes Sherol Dixie, MD  OXYGEN  Inhale 2 L into the lungs at bedtime.   Yes [provider]  potassium chloride  (KLOR-CON  M) 10 MEQ tablet Take 1 tablet (10 mEq total) by mouth daily. 03/12/23  Yes Sherol Dixie, MD  ramelteon  (ROZEREM ) 8 MG tablet Take 1 tablet (8 mg total) by mouth at bedtime. 06/10/23 06/09/24 Yes Sherol Dixie, MD  venlafaxine  XR (EFFEXOR -XR) 150 MG 24 hr  capsule Take 1 capsule (150 mg total) by mouth daily with breakfast. 04/07/23  Yes Sherol Dixie, MD  warfarin (COUMADIN ) 2.5 MG tablet Take 1 tablet (2.5 mg total) by mouth daily unless otherwise instructed 12/23/22  Yes Sherol Dixie, MD  Continuous Glucose Sensor (FREESTYLE LIBRE 3 PLUS SENSOR) MISC Change sensor every 15 days. 04/29/23   Sherol Dixie, MD  Insulin  Pen Needle 32G X 4 MM MISC Use to inject insulin  daily 05/18/23   Plyler, Melodie Spry, RD  ketoconazole  (NIZORAL ) 2 % cream Apply 1 application topically at bedtime as needed for irritation. Patient not taking: Reported on 06/18/2023 09/08/22   Kenda Paula, RPH-CPP  predniSONE  (DELTASONE ) 20 MG tablet Take 2 tablets (40 mg total) by mouth daily. Patient not taking: Reported on 05/20/2023 05/15/23   Horton, Vonzella Guernsey, MD  senna-docusate (SENOKOT-S) 8.6-50 MG tablet Take 1 tablet by mouth daily after supper. Patient not taking: Reported on 06/18/2023 05/23/23   Maxie Spaniel, MD   Allergies  Allergen Reactions   Dexilant [Dexlansoprazole] Other (See Comments)    Made  acid reflux worse   Metoclopramide      Other Reaction(s): unknown   Isovue  [Iopamidol ] Itching and Rash   Lorazepam Other (See Comments)    Patient's sister noted that ativan caused the patient to become extremely confused during hospitalization 09/2010; tolerates Xanax    Oxycontin  [Oxycodone ] Other (See Comments)    headache   Tramadol Hcl Swelling    Ankle swelling   Review of Systems  Constitutional:  Positive for fatigue.  Respiratory:  Positive for shortness of breath.   Neurological:  Positive for weakness.  All other systems reviewed and are negative.   Physical Exam Cardiovascular:     Rate and Rhythm: Tachycardia present.  Pulmonary:     Effort: Pulmonary effort is normal.  Skin:    General: Skin is warm and dry.  Neurological:     Mental Status: She is alert and oriented to person, place, and time.     Vital Signs: BP (!) 152/54  (BP Location: Right Arm)   Pulse 80   Temp 98.6 F (37 C) (Oral)   Resp 20   Ht 5\' 2"  (1.575 m)   Wt 62 kg   SpO2 95%   BMI 25.00 kg/m  Pain Scale: 0-10 POSS *See Group Information*: S-Acceptable,Sleep, easy to arouse Pain Score: 0-No pain   SpO2: SpO2: 95 % O2 Device:SpO2: 95 % O2 Flow Rate: .O2 Flow Rate (L/min): 2 L/min  IO: Intake/output summary:  Intake/Output Summary (Last 24 hours) at 06/22/2023 0950 Last data filed at 06/22/2023 0859 Gross per 24 hour  Intake 678.74 ml  Output 5050 ml  Net -4371.26 ml    LBM: Last BM Date : 06/20/23 Baseline Weight: Weight: 63.5 kg Most recent weight: Weight: 62 kg     Palliative Assessment/Data:  50 %     Time 75 minutes  Discussed with Dr Herminio Lopes  Signed by: [name], NP   Please contact Palliative Medicine Team phone at [phone] for questions and concerns.  For individual provider: See Tilford Foley

## 2023-06-22 NOTE — Progress Notes (Addendum)
 Patient Name: [name] Date of Encounter: 06/22/2023 [facility] Cardiologist: Peder Bourdon, MD    Interval Summary  .    73 y.o. female with a hx of oxygen  dependent COPD, mitral stenosis s/p bioprosthetic MV replacement, R carotid artery stenosis s/p endarterectomy 2012, paroxysmal atrial fibrillation, GI bleeding due to AVM, pulmonary hypertension/RV failure, type 2 DM, PAD, renal cell cancer s/p left kidney cryoablation, tobacco use, who is admitted for acute HFpEF.   Patient is quite sleepy this morning, falls asleep frequently during encounter, states she is tired, denied chest pain, unsure if her breathing is improving.    Vital Signs .    Vitals:   06/22/23 0022 06/22/23 0505 06/22/23 0751 06/22/23 0800  BP: (!) 159/104 (!) 185/54 (!) 152/54   Pulse: 80 77 86 88  Resp: (!) 22 20 19 20   Temp: 98.2 F (36.8 C) 98.3 F (36.8 C) 98.6 F (37 C)   TempSrc: Oral Oral Oral   SpO2: 94% 91% 96% 96%  Weight:  62 kg    Height:        Intake/Output Summary (Last 24 hours) at 06/22/2023 0912 Last data filed at 06/22/2023 0859 Gross per 24 hour  Intake 678.74 ml  Output 5050 ml  Net -4371.26 ml      06/22/2023    5:05 AM 06/21/2023    5:36 AM 06/20/2023    3:41 AM  Last 3 Weights  Weight (lbs) 136 lb 11 oz 140 lb 10.5 oz 143 lb 1.3 oz  Weight (kg) 62 kg 63.8 kg 64.9 kg      Telemetry/ECG    Sinus rhythm 80-90s, first degree AVB, frequent PACs and occasional PVCs - Personally Reviewed  Physical Exam .   GEN: No acute distress.   Neck: elevated JVD Cardiac: Irregular, diastolic murmur  Respiratory: Clear but diminished at base to auscultation bilaterally. ON 2L . Pox 98%.  GI: Soft, nontender, non-distended  MS: 1+ BLE edema with bilateral erythema   Assessment & Plan .      Acute on chronic HFpEF RV failure  Pulmonary HTN group 2 and 3  - presented with >1 month BLE edema, DOE, orthopnea, global weakness; continue to smoke 1 PPD,  consume diet high in Na, and non-compliant with CPAP lately  - Echo from 05/21/23 showed LVEF 60-65%, no RWMA, mild LVH, mod reduced RV systolic function, mod RV enlargement, severely elevated PASP, severe LAE, mod MS with bioprosthetic valve present in the mitral position with mean gradient 10 mmHg, mild AS with mean gradient 10.3 mmHg - Felt no indication for pulmonary vasodilator by AHF team 2023, lost to follow up since  - on lasix  20mg  daily PTA and self increased to 40mg  daily for a week, IV lasix  was transitioned to lasix  gtt 8mg /hr yesterday given persistent volume overload, UOP +1 count over the past 24 hours, weight is down from 143.3 to 136.69 ib, responding well, will continue lasix  gtt for another 24 hours, monitor I&O, BMP, weight - Educated on Na limitation, importance of CPAP use, and smoking cessation  - GDMT: on PTA valsartan 50mg  and toprol  XL 25mg , both are continued; discussed with the pharmacy team to explore cost for entresto, spironolactone, and farxiga today; spironolactone 50mg  added this admission, plan to transition to [facility] next    Mitral stenosis s/p bioprosthetic valve replacement 2012 - Echo from 05/21/23 stable, not surgical candidate    Paroxysmal A fib  - in sinus rhythm with first degree  AVB and frequent PACs, no A fib found on telemetry  - continue PTA  toprol  XL 25mg  and coumadin  (she preferred coumadin  over NOAC in the past)   COPD OSA Tobacco use PAD DM - per primary team  For questions or updates, please contact [facility] Please consult [email or web address] for contact info under    Signed, Xika Zhao, NP  Patient seen and examined, note reviewed with the signed Advanced Practice Provider. I personally reviewed laboratory data, imaging studies and relevant notes. I independently examined the patient and formulated the important aspects of the plan. I have personally discussed the plan with the patient and/or family. Comments or changes to  the note/plan are indicated below.   Patient seen and examined at her bedside. She was sitting up in bed when I arrived and offered no complaints at this time.   Acute on chronic heart failure with preserved ejection fraction  RV failure  Pulmonary hypertension  Status post MVR now with mitral stenosis - not a surgical candidate  OSA  PAF now in sinus rhythm    Clinically she still appears to volume overloaded - she has tolerated the lasix  gtt we will leave it on for another 24 hrs and reassess tomorrow. Much better I&Os - net negative today ( -4371 ml).   Cr is normal   Continue her current medical management with toprol  and losartan . Agree with the start of Spironolactone and fargixa but closely monitor cr as well.   Her blood pressure is elevated today - hoping the addition of aldactone will help her.   Is not a candidate for surgery in terms of her mitral valve disease - medical managements for symptoms and heart failure    Jerryl Morin DO, MS [facility] Attending Cardiologist [facility]  [address] [place], [place] [address] [phone] Website: [email or web address]

## 2023-06-23 DIAGNOSIS — I110 Hypertensive heart disease with heart failure: Secondary | ICD-10-CM

## 2023-06-23 DIAGNOSIS — Z952 Presence of prosthetic heart valve: Secondary | ICD-10-CM

## 2023-06-23 DIAGNOSIS — I5033 Acute on chronic diastolic (congestive) heart failure: Secondary | ICD-10-CM | POA: Diagnosis not present

## 2023-06-23 DIAGNOSIS — I10 Essential (primary) hypertension: Secondary | ICD-10-CM | POA: Diagnosis not present

## 2023-06-23 LAB — GLUCOSE, CAPILLARY
Glucose-Capillary: 219 mg/dL — ABNORMAL HIGH (ref 70–99)
Glucose-Capillary: 240 mg/dL — ABNORMAL HIGH (ref 70–99)
Glucose-Capillary: 176 mg/dL — ABNORMAL HIGH (ref 70–99)
Glucose-Capillary: 199 mg/dL — ABNORMAL HIGH (ref 70–99)

## 2023-06-23 LAB — CBC
HCT: 40.7 % (ref 36.0–46.0)
Hemoglobin: 11.6 g/dL — ABNORMAL LOW (ref 12.0–15.0)
MCH: 24.8 pg — ABNORMAL LOW (ref 26.0–34.0)
MCHC: 28.5 g/dL — ABNORMAL LOW (ref 30.0–36.0)
MCV: 87 fL (ref 80.0–100.0)
Platelets: 261 10*3/uL (ref 150–400)
RBC: 4.68 MIL/uL (ref 3.87–5.11)
RDW: 28.4 % — ABNORMAL HIGH (ref 11.5–15.5)
WBC: 7.6 10*3/uL (ref 4.0–10.5)
nRBC: 0 % (ref 0.0–0.2)

## 2023-06-23 LAB — BASIC METABOLIC PANEL WITH GFR
Anion gap: 7 (ref 5–15)
BUN: 35 mg/dL — ABNORMAL HIGH (ref 8–23)
CO2: 37 mmol/L — ABNORMAL HIGH (ref 22–32)
Calcium: 9.3 mg/dL (ref 8.9–10.3)
Chloride: 96 mmol/L — ABNORMAL LOW (ref 98–111)
Creatinine, Ser: 1.13 mg/dL — ABNORMAL HIGH (ref 0.44–1.00)
GFR, Estimated: 51 mL/min — ABNORMAL LOW (ref >60)
Glucose, Bld: 196 mg/dL — ABNORMAL HIGH (ref 70–99)
Potassium: 4.6 mmol/L (ref 3.5–5.1)
Sodium: 140 mmol/L (ref 135–145)

## 2023-06-23 LAB — PROTIME-INR
INR: 1.5 — ABNORMAL HIGH (ref 0.8–1.2)
Prothrombin Time: 18 s — ABNORMAL HIGH (ref 11.4–15.2)

## 2023-06-23 LAB — LIPID PANEL
Cholesterol: 150 mg/dL (ref 0–200)
HDL: 32 mg/dL — ABNORMAL LOW (ref >40)
LDL Cholesterol: 91 mg/dL (ref 0–99)
Total CHOL/HDL Ratio: 4.7 ratio
Triglycerides: 135 mg/dL (ref <150)
VLDL: 27 mg/dL (ref 0–40)

## 2023-06-23 MED ORDER — CARVEDILOL 12.5 MG PO TABS
12.5000 mg | ORAL_TABLET | Freq: Two times a day (BID) | ORAL | Status: DC
  Administered 2023-06-23 – 2023-06-26 (×6): 12.5 mg via ORAL
  Filled 2023-06-23 (×6): qty 1

## 2023-06-23 MED ORDER — WARFARIN SODIUM 5 MG PO TABS
5.0000 mg | ORAL_TABLET | Freq: Once | ORAL | Status: AC
  Administered 2023-06-23: 5 mg via ORAL
  Filled 2023-06-23: qty 1

## 2023-06-23 MED ORDER — FUROSEMIDE 10 MG/ML IJ SOLN
40.0000 mg | Freq: Two times a day (BID) | INTRAMUSCULAR | Status: DC
  Administered 2023-06-23 – 2023-06-25 (×4): 40 mg via INTRAVENOUS
  Filled 2023-06-23 (×4): qty 4

## 2023-06-23 NOTE — Progress Notes (Signed)
 Subjective Continued HA. No visual changes, chest pain. SOB continues to improve.  Physical exam Blood pressure (!) 170/87, pulse 81, temperature (!) 97.4 F (36.3 C), temperature source Oral, resp. rate (!) 23, height 5\' 2"  (1.575 m), weight 60.2 kg, SpO2 93%.  Constitutional: sitting up in bed, in no acute distress HENT: PERRLA Cardiovascular: regular rate and rhythm, no m/r/g, bilateral LEE Pulmonary/Chest: normal work of breathing on 2 L [place], crackles bilaterally Neurological: alert & oriented Skin: bilateral LE erythema up to mid shin, mild TTP bilaterally  Weight change: -1.8 kg   Intake/Output Summary (Last 24 hours) at 06/23/2023 0950 Last data filed at 06/23/2023 0930 Gross per 24 hour  Intake 1169.9 ml  Output 3000 ml  Net -1830.1 ml   Net IO Since Admission: -5,524.36 mL [06/23/23 0950]  Labs, images, and other studies    Latest Ref Rng & Units 06/23/2023    3:11 AM 06/22/2023    2:36 AM 06/21/2023    2:02 AM  CBC  WBC 4.0 - 10.5 K/uL 7.6  8.7  8.0   Hemoglobin 12.0 - 15.0 g/dL 16.1  09.6  04.5   Hematocrit 36.0 - 46.0 % 40.7  42.9  36.4   Platelets 150 - 400 K/uL 261  283  263        Latest Ref Rng & Units 06/23/2023    3:11 AM 06/22/2023    2:36 AM 06/21/2023    2:02 AM  BMP  Glucose 70 - 99 mg/dL 409  811  914   BUN 8 - 23 mg/dL 35  26  24   Creatinine 0.44 - 1.00 mg/dL 7.82  9.56  2.13   Sodium 135 - 145 mmol/L 140  140  139   Potassium 3.5 - 5.1 mmol/L 4.6  3.9  3.9   Chloride 98 - 111 mmol/L 96  96  99   CO2 22 - 32 mmol/L 37  32  33   Calcium  8.9 - 10.3 mg/dL 9.3  9.4  9.0      Assessment and plan Hospital day 5  Wiley Arminta Mcroy is a 73 y.o.female pertinent PMH of R carotid artery stenosis s/p endarterectomy, s/p MVR, HFpEF, pAF on warfarin, tobacco use disorder, T2DM, and COPD/emphysema on 2 L [place] at bedtime, who presented from the [facility] with bilateral lower extremity edema and admitted for HFpEF exacerbation.    Acute on  chronic HFpEF Pulmonary hypertension Cardiology following, appreciate recommendations. Lasix  gtt transition to IV Lasix  60 BID today. Decent output but still volume overloaded on exam. -Continue Losartan  50, Toprol -XL 25, and Aldactone 50. Cardiology considering Entresto/Jardiance  and pharmacy looking into cost. - Strict I&Os, daily weights - Daily BMP, Mg   Paroxysmal afib S/P mitral valve repair Hypertension Meds per above. Warfarin per pharmacy for MV stenosis s/p bioprothetic valve (2012). INR remains subtherapeutic. BP elevated but looks better today.  -Consider up-titration of Losartan   PAD ABI's from 05/2023 show R>L PAD. LE exam consistent with ischemic rubor. Will check Lipid panel though this has looked good in the past. Not a candidate for ASA while on Warfarin. Will continue to encourage smoking cessation as this appears to be only modifiable risk factor presently.    Emphysema Chronic hypoxic respiratory On Breztri  here (Trelegy at home) and DuoNebs prn. - Maintain SpO2 between 88-92% - Incentive spirometry  - Consider pulmonary rehab outpatient   T2DM A1c on admission  is 5.9. CBG's above goal. -Increase Semglee  to 36 units daily + SSI starting today   Depression - Continue venlafaxine , wellbutrin , ramelteon , and xanax    Iron  deficiency anemia Stable at 11.2 today. Has been receiving IV iron  as an outpatient with last infusion on 4/17.     Chronic pain Neuralgia -Continue home Norco 5-325 two tablets q6h PRN and gabapentin  600 mg at bedtime.    Constipation - Continue linzess  - Miralax  daily PRN   Goals of Care Palliative has been consulted, remains full code, full scope. TOC following for SNF placement, pending.  Ronni Colace, DO 06/23/2023, 9:50 AM  Pager: [phone] After 5pm or weekend: [phone]

## 2023-06-23 NOTE — Progress Notes (Signed)
 PHARMACY - ANTICOAGULATION CONSULT NOTE  Pharmacy Consult for warfarin management. Indication: atrial fibrillation  Allergies  Allergen Reactions   Dexilant [Dexlansoprazole] Other (See Comments)    Made acid reflux worse   Metoclopramide      Other Reaction(s): unknown   Isovue  [Iopamidol ] Itching and Rash   Lorazepam Other (See Comments)    Patient's sister noted that ativan caused the patient to become extremely confused during hospitalization 09/2010; tolerates Xanax    Oxycontin  [Oxycodone ] Other (See Comments)    headache   Tramadol Hcl Swelling    Ankle swelling    Patient Measurements: Height: 5\' 2"  (157.5 cm) Weight: 60.2 kg (132 lb 11.5 oz) IBW/kg (Calculated) : 50.1 HEPARIN  DW (KG): 63.3  Vital Signs: Temp: 97.8 F (36.6 C) (05/13 0344) Temp Source: Oral (05/13 0344) BP: 156/42 (05/13 0344) Pulse Rate: 72 (05/13 0344)  Labs: Recent Labs    06/21/23 0202 06/22/23 0236 06/23/23 0311  HGB 10.3* 12.0 11.6*  HCT 36.4 42.9 40.7  PLT 263 283 261  LABPROT 18.8* 17.3* 18.0*  INR 1.6* 1.4* 1.5*  CREATININE 0.87 0.98 1.13*    Estimated Creatinine Clearance: 37.3 mL/min (A) (by C-G formula based on SCr of 1.13 mg/dL (H)).   Medical History: Past Medical History:  Diagnosis Date   Abnormality of lung on CXR 02/02/2020   Nonspecific finding on CXR ordered by pulmonologist - c/w inflammation vs infection, f/u imaging suggested, Dr. Antonette Batters office has been in communication about recommended next steps.   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   Non-bleeding when visualized on capsule endoscopy 06/30/2015    Arthritis    "lower back; hands" (02/19/2018)   Asthma    Asymptomatic cholelithiasis 09/25/2015   Seen on CT scan 08/2015   Carotid artery stenosis; s/p R endarterectomy    s/p right endarterectomy (06/2010) Carotid US  (07/2010):   Left: Moderate-to-severe (60-79%) calcific and non-calcific plaque origin and proximal ICA and ECA    Chronic congestive heart failure with left ventricular diastolic dysfunction (HCC) 10/21/2010   Chronic constipation 02/03/2011   Chronic daily headache 01/16/2014   Chronic iron  deficiency anemia    Chronic low back pain 10/06/2012   Chronic venous insufficiency 08/04/2012   Closed fracture of one rib of left side 08/23/2021   ED visit after a fall 08/16/21    COPD (chronic obstructive pulmonary disease) with emphysema (HCC)    PFTs 2018: severe obstructive disease, insignif response to bronchiodilator, mild restriction parenchymal pattern, moderately severe diffusion defect. 2014  FEV1 0.92 (40%), ratio 69, 27% increase in FEV1 with BD, TLC 91%, severe airtrapping, DLCO49% On chronic home O2. Pulmonary rehab referral 05/2012    Dyspnea    Fibromyalgia 08/29/2010   Gastroesophageal reflux disease    History of blood transfusion    "several times"  (02/19/2018)   History of clear cell renal cell carcinoma (HCC), in remission 07/21/2011   s/p cryoablation of left RCC in 09/2011 by Dr. Nereida Banning. Followed by Dr. Joie Narrow  [facility]) .     History of fracture of left hip 10/17/2022   History of hiatal hernia    History of mitral valve replacement with bioprosthetic valve due to mitral stenosis 2012   s/p MVR with a 27-mm pericardial porcine valve (Medtronic Mosaic valve, serial #[id number] on 09/20/10, Dr. Matt Song)    History of obstructive sleep apnea, resolved 2013   resolved per sleep study  07/2019; no apnea, but did have desaturation.  CPAP no longer necessary.  Nocturnal polysomnography (06/2009): Moderate sleep apnea/ hypopnea syndrome , AHI 17.8 per hour with nonpositional hypopneas. CPAP titration to 12 CWP, AHI 2.4 per hour. On nocturnal CPAP via a small resMed Quattro full-face mask with heated humidifier.    History of pneumonia    "once"  (02/19/2018)   History of seborrheic  keratosis 09/28/2015   History of stroke without residual deficits 01/17/2022   Hyperlipidemia LDL goal < 100 11/20/2005   Internal and external hemorrhoids without complication 08/04/2012   Lesion of left native kidney 06/01/2020   Incidental finding on recent screening chest CT for lung ca ordered by Dr. Linder Revere pulmonology - he has ordered a f/u renal U/S.    Lichen sclerosus of female genitalia 01/12/2017   Migraine    "none in years" (02/19/2018)   Mild cognitive impairment with memory loss 12/23/2021   Moderately severe major depression (HCC) 11/19/2005   Nocturnal hypoxia per sleep study 07/2019    Osteoporosis    DEXA 2016: T -2.7; DEXA (12/09/2011): L-spine T -3.7, left hip T -1.4 DEXA (12/2004): L-spine T -2.6, left hip -0.1    Paroxysmal atrial fibrillation (HCC) 10/22/2010   s/p Left atrial maze procedure for paroxysmal atrial fibrillation on 09/20/2010 by Dr Matt Song.  Subsequent splenic infarct, decision was made to re-anticoagulate with coumadin , likely life-long as this is the most likely cause of the splenic infarct.    Personal history of colonic polyps 05/14/2011   Colonoscopy (05/2011): 4 mm adenomatous polyp excised endoscopically Colonoscopy (02/2002): Adenomatous polyp excised endoscopically    Personal history of renal cell carcinoma 09/12/2020   Pneumonia    Pulmonary hypertension due to chronic obstructive pulmonary disease (HCC) 04/25/2016   2014 TEE w PA peak pressure 46 mmHg, s/p MV replacement    Right nephrolithiasis, asymptomatic, incidental finding 09/06/2014   5 mm non-obstructing calculus seen on CT scan 09/05/2014    Right ventricular failure (HCC) 04/25/2016   Severe obesity (BMI 35.0-39.9) with comorbidity (HCC) 10/23/2011   Sleep apnea    Stage 2 chronic kidney disease due to type 2 diabetes mellitus (HCC) 12/16/2018   CKD stage III     Tobacco abuse 07/28/2012   Type 2 diabetes mellitus with complication, with long-term current use of insulin  (HCC)     Type 2 diabetes mellitus with diabetic neuropathy (HCC)    Weight loss due to medication 03/12/2023    Medications:  Scheduled:   acetaminophen   1,000 mg Oral Once   ALPRAZolam   0.5 mg Oral QHS   budeson-glycopyrrolate -formoterol   2 puff Inhalation BID   buPROPion   150 mg Oral BH-q7a   gabapentin   600 mg Oral QHS   insulin  aspart  0-15 Units Subcutaneous TID WC   insulin  glargine-yfgn  36 Units Subcutaneous Daily   ipratropium-albuterol   3 mL Nebulization BID   linaclotide   72 mcg Oral QAC breakfast   losartan   50 mg Oral Daily   metoprolol  succinate  25 mg Oral Daily   multivitamin with minerals  1 tablet Oral Daily   nicotine   14 mg Transdermal Daily   pantoprazole   40 mg Oral Daily   Ensure Max Protein  11 oz Oral BID   ramelteon   8 mg Oral QHS   spironolactone  50 mg Oral Daily   venlafaxine  XR  150 mg Oral Q breakfast   Warfarin - Pharmacist Dosing Inpatient   Does not apply q1600    Assessment: INR  1.5 @ 0311h. Goal of Therapy:  INR target range 2.0 - 2.5 as per PCP Dr. Broadus Canes.    Plan:  Warfarin, 5 mg PO today and evaluate dose response, follow for signs/symptoms of bleeding.  Kenda Paula, PharmD, CPP Clinical Pharmacist Practitioner  06/23/2023,7:40 AM

## 2023-06-23 NOTE — TOC Progression Note (Signed)
 Transition of Care [facility]) - Progression Note    Patient Details  Name: [name] MRN: [id number] Date of Birth: 10-31-1950  Transition [facility]) CM/SW Contact  Arron Big, [place] Phone Number: 06/23/2023, 2:47 PM  Clinical Narrative:   [name] can accept patient at this time. CSW spoke with patient at bedside and she is agreeable to going to [facility] for SNF. CSW explained that insurance auth will be submitted for but if denied would patient be comfortable going home with [facility]; patient stated yes, if need be. CSW updated treatment team.   2:55 PM Per insurance, it is too early to start insurance auth. CSW will need to call closer to DC.   TOC will continue to follow.    Expected Discharge Plan: Skilled Nursing Facility Barriers to Discharge: Continued Medical Work up  Expected Discharge Plan and Services     Post Acute Care Choice: Skilled Nursing Facility Living arrangements for the past 2 months: Single Family Home                                       Social Determinants of Health (SDOH) Interventions SDOH Screenings   Food Insecurity: No Food Insecurity (06/18/2023)  Recent Concern: Food Insecurity - Food Insecurity Present (05/25/2023)  Housing: Low Risk  (06/18/2023)  Transportation Needs: No Transportation Needs (06/18/2023)  Recent Concern: Transportation Needs - Unmet Transportation Needs (04/08/2023)  Utilities: Not At Risk (06/18/2023)  Alcohol Screen: Low Risk  (04/08/2023)  Depression (PHQ2-9): High Risk (04/08/2023)  Financial Resource Strain: Low Risk  (04/08/2023)  Physical Activity: Inactive (04/08/2023)  Social Connections: Socially Isolated (06/18/2023)  Stress: Stress Concern Present (04/08/2023)  Tobacco Use: High Risk (06/18/2023)  Health Literacy: Adequate Health Literacy (04/08/2023)    Readmission Risk Interventions     No data to display

## 2023-06-23 NOTE — Progress Notes (Signed)
 Occupational Therapy Treatment Patient Details Name: [name] MRN: [id number] DOB: 22-Sep-1950 Today's Date: 06/23/2023   History of present illness 73 y.o. female admitted 06/18/23 with LB edema, DOE and acute on chronic HFpEF. PMHx: COPD on O2, MVR, Rt CEA, PAF, CHF, HLD, GERD, HTN, GIB,  pulmonary hypertension/RV failure, T2DM, PAD, renal CA, tobacco use.   OT comments  Patient progressing and showed improved functional cognition, no longer with flat affect, compared to previous session. Pt performed limited HEP due to toileting needs, educated on energy conservation with handsouts of EC and HEP, and performed toileting while performing her own anterior peri care and clothing management in standing as well as performing stand pivot to EOB with light CGA of pt holding Ot's arm.  Patient remains limited by headaches, LE "sciatica" pain, generalized weakness and decreased activity tolerance along with deficits noted below. Pt continues to demonstrate very good rehab potential and would benefit from continued skilled OT to increase safety and independence with ADLs and functional transfers to allow pt to return home safely and reduce caregiver burden and fall risk.       If plan is discharge home, recommend the following:  A little help with walking and/or transfers;A little help with bathing/dressing/bathroom;Assistance with cooking/housework;Assist for transportation;Help with stairs or ramp for entrance   Equipment Recommendations  None recommended by OT    Recommendations for Other Services      Precautions / Restrictions Precautions Precautions: Fall;Other (comment) Precaution/Restrictions Comments: watch SpO2 Restrictions Weight Bearing Restrictions Per Provider Order: No       Mobility Bed Mobility   Bed Mobility: Sit to Supine       Sit to supine: Supervision        Transfers                         Balance Overall balance assessment: History  of Falls, Needs assistance Sitting-balance support: No upper extremity supported, Feet supported Sitting balance-Leahy Scale: Good     Standing balance support: Single extremity supported, During functional activity Standing balance-Leahy Scale: Fair                             ADL either performed or assessed with clinical judgement   ADL Overall ADL's : Needs assistance/impaired Eating/Feeding: Modified independent;Sitting   Grooming: Set up;Sitting                 Lower Body Dressing Details (indicate cue type and reason): Able to figure 4 LLE but not RT due to old hip surgery. Toilet Transfer: Supervision/safety;Cueing for Psychiatric nurse Details (indicate cue type and reason): Pt performed squat pivot choosing to skip use of RW Recliner<>BSC with SBA. Pt stood from recliner and performed stand pivot to EOB with CGA. No AD. Toileting- Clothing Manipulation and Hygiene: Set up;Sitting/lateral lean;Contact guard assist Toileting - Clothing Manipulation Details (indicate cue type and reason): Pt stood to perform anterior peri hygiene and held OT's arm with one hand (CGA). Bladder void transferred to hat in BR for measure       General ADL Comments: Pt fatigued from recent ambulaion with PT. Much of session went over Energy conservation education and pt given handout. Pt also given handout on BUE theraband exercises and tied energy conservation concepts into HEP. Pt began with yellow T-BAnd for chest pull and performed 7 reps. Pt took rest break then had urgent need for [facility]  so exercises truncated for other needs.    Extremity/Trunk Assessment Upper Extremity Assessment Upper Extremity Assessment: Generalized weakness   Lower Extremity Assessment Lower Extremity Assessment: Generalized weakness   Cervical / Trunk Assessment Cervical / Trunk Assessment: Kyphotic    Vision       Perception     Praxis     Communication  Communication Communication: No apparent difficulties Factors Affecting Communication: Hearing impaired   Cognition Arousal: Alert Behavior During Therapy: WFL for tasks assessed/performed (Animated, smiling, laughing) Cognition: No apparent impairments     Awareness: Intellectual awareness intact, Online awareness intact   Attention impairment (select first level of impairment): Alternating attention   OT - Cognition Comments: Pt AAOx4 and pleasant throughout session.                 Following commands: Intact        Cueing   Cueing Techniques: Verbal cues  Exercises      Shoulder Instructions       General Comments      Pertinent Vitals/ Pain       Pain Assessment Pain Assessment: 0-10 Pain Score: 8  Pain Location: Headache.  RN to room and notified. Pt also reported sciatica pain to BLEs when on BSC. BSC too tall for pt and at lowest setting. Would benefit from lower commode. Pt shown stretches in chair to address LE pain and pt could perform on LLE but not RT. Pain Descriptors / Indicators: Headache (and sciatica related LE pain on tall BSC) Pain Intervention(s): Repositioned, Limited activity within patient's tolerance, Monitored during session, Patient requesting pain meds-RN notified  Home Living                                          Prior Functioning/Environment              Frequency  Min 2X/week        Progress Toward Goals  OT Goals(current goals can now be found in the care plan section)  Progress towards OT goals: Progressing toward goals  Acute Rehab OT Goals Patient Stated Goal: Pt agreeable to inpatient rehab. OT Goal Formulation: With patient Time For Goal Achievement: 07/04/23 Potential to Achieve Goals: Good  Plan      Co-evaluation                 AM-PAC OT "6 Clicks" Daily Activity     Outcome Measure   Help from another person eating meals?: None Help from another person taking care of  personal grooming?: A Little Help from another person toileting, which includes using toliet, bedpan, or urinal?: A Little Help from another person bathing (including washing, rinsing, drying)?: A Little Help from another person to put on and taking off regular upper body clothing?: A Little Help from another person to put on and taking off regular lower body clothing?: A Little 6 Click Score: 19    End of Session Equipment Utilized During Treatment: Oxygen   OT Visit Diagnosis: Unsteadiness on feet (R26.81);Other abnormalities of gait and mobility (R26.89);Muscle weakness (generalized) (M62.81);Other (comment)   Activity Tolerance Patient limited by fatigue;Patient limited by pain   Patient Left in bed;with call bell/phone within reach;with nursing/sitter in room   Nurse Communication Patient requests pain meds        Time: 1610-9604 OT Time Calculation (min): 37 min  Charges: OT General Charges $OT  Visit: 1 Visit OT Treatments $Self Care/Home Management : 8-22 mins $Therapeutic Activity: 8-22 mins  [facility], OT Acute Rehab Services Office: [phone] 06/23/2023   Asher Blade 06/23/2023, 3:02 PM

## 2023-06-23 NOTE — Inpatient Diabetes Management (Addendum)
 Inpatient Diabetes Program Recommendations  AACE/ADA: New Consensus Statement on Inpatient Glycemic Control (2015)  Target Ranges:  Prepandial:   less than 140 mg/dL      Peak postprandial:   less than 180 mg/dL (1-2 hours)      Critically ill patients:  140 - 180 mg/dL   Lab Results  Component Value Date   GLUCAP 240 (H) 06/23/2023   HGBA1C 5.9 (H) 06/20/2023    Review of Glycemic Control  Latest Reference Range & Units 06/22/23 06:12 06/22/23 11:18 06/22/23 16:20 06/22/23 21:15 06/23/23 06:20 06/23/23 11:19  Glucose-Capillary 70 - 99 mg/dL 161 (H) 096 (H) 045 (H) 317 (H) 219 (H) 240 (H)   Diabetes history: DM 2 Outpatient Diabetes medications: Tresiba  30 units Daily Current orders for Inpatient glycemic control:  Semglee  36 units Daily Novolog  0-15 units tid  Ensure Max bid between meals  Inpatient Diabetes Program Recommendations:    -   Consider Novolog  4 units tid meal coverage if eating >50% of meals/supplements  Thanks,  Eloise Hake RN, MSN, BC-ADM Inpatient Diabetes Coordinator Team Pager [phone] (8a-5p)

## 2023-06-23 NOTE — Plan of Care (Signed)

## 2023-06-23 NOTE — Progress Notes (Signed)
 This chaplain responded to PMT NP-Mary's consult for Pt. requested spiritual care. The Pt. is awake at the time of the visit. The chaplain provided Pt. education on the role of spiritual care after the Pt. identified with previous medical team interruptions while the Pt. is attempting to sleep.   The chaplain began rapport building with the Pt. in the space of reflective listening. The Pt. shares the question "what will happen to my house?" with the chaplain. The Pt. shares the previous question is all I can think about.  The chaplain understands and the Pt. acknowledged her choice of expert advise on real estate before making medical decisions. The chaplain understands a referral for social work. was included in the Pt. chart notes. The Pt. has a daughter and a sister who can provide support after the Pt. has received information to support her decision.   The Pt. Updated lunch has arrived and the Pt. Requests a second visit on Wednesday.  Chaplain Kathleene Papas [phone]

## 2023-06-23 NOTE — Progress Notes (Signed)
 Progress Note  Patient Name: [name] Date of Encounter: 06/23/2023  Primary Cardiologist: None   Subjective   Patient seen and examined at her bedside. Still clinically volume overloaded.  Inpatient Medications    Scheduled Meds:  acetaminophen   1,000 mg Oral Once   ALPRAZolam   0.5 mg Oral QHS   budeson-glycopyrrolate -formoterol   2 puff Inhalation BID   buPROPion   150 mg Oral BH-q7a   gabapentin   600 mg Oral QHS   insulin  aspart  0-15 Units Subcutaneous TID WC   insulin  glargine-yfgn  36 Units Subcutaneous Daily   ipratropium-albuterol   3 mL Nebulization BID   linaclotide   72 mcg Oral QAC breakfast   losartan   50 mg Oral Daily   metoprolol  succinate  25 mg Oral Daily   multivitamin with minerals  1 tablet Oral Daily   nicotine   14 mg Transdermal Daily   pantoprazole   40 mg Oral Daily   Ensure Max Protein  11 oz Oral BID   ramelteon   8 mg Oral QHS   spironolactone  50 mg Oral Daily   venlafaxine  XR  150 mg Oral Q breakfast   warfarin  5 mg Oral ONCE-1600   Warfarin - Pharmacist Dosing Inpatient   Does not apply q1600   Continuous Infusions:  furosemide  (LASIX ) 200 mg in dextrose  5 % 100 mL (2 mg/mL) infusion 8 mg/hr (06/23/23 1000)   PRN Meds: guaiFENesin , HYDROcodone -acetaminophen , menthol -cetylpyridinium, ondansetron  (ZOFRAN ) IV, polyethylene glycol   Vital Signs    Vitals:   06/23/23 0818 06/23/23 0854 06/23/23 0900 06/23/23 1000  BP: (!) 170/87     Pulse: 78  81 74  Resp: (!) 23  (!) 23 (!) 21  Temp:      TempSrc:      SpO2: 92% 95% 93% 93%  Weight:      Height:        Intake/Output Summary (Last 24 hours) at 06/23/2023 1024 Last data filed at 06/23/2023 1000 Gross per 24 hour  Intake 1176.9 ml  Output 3000 ml  Net -1823.1 ml   Filed Weights   06/21/23 0536 06/22/23 0505 06/23/23 0500  Weight: 63.8 kg 62 kg 60.2 kg    Telemetry     - Personally Reviewed  ECG     - Personally Reviewed  Physical Exam     General:  Comfortable, awake in bed - getting ready to walk with her nurse Head: Atraumatic, normal size  Eyes: PEERLA, EOMI  Neck: Supple, normal JVD Cardiac: Normal S1, S2; RRR; no murmurs, rubs, or gallops Lungs: Clear to auscultation bilaterally Abd: Soft, nontender, no hepatomegaly  Ext: warm, +2-3 LE edema bilaterally , erythema  Musculoskeletal: No deformities, BUE and BLE strength normal and equal Skin: Warm and dry, no rashes   Neuro: Alert and oriented to person, place, time, and situation, CNII-XII grossly intact, no focal deficits  Psych: Normal mood and affect   Labs    Chemistry Recent Labs  Lab 06/21/23 0202 06/22/23 0236 06/23/23 0311  NA 139 140 140  K 3.9 3.9 4.6  CL 99 96* 96*  CO2 33* 32 37*  GLUCOSE 206* 248* 196*  BUN 24* 26* 35*  CREATININE 0.87 0.98 1.13*  CALCIUM  9.0 9.4 9.3  GFRNONAA >60 >60 51*  ANIONGAP 7 12 7      Hematology Recent Labs  Lab 06/21/23 0202 06/22/23 0236 06/23/23 0311  WBC 8.0 8.7 7.6  RBC 4.18 4.93 4.68  HGB 10.3* 12.0 11.6*  HCT 36.4 42.9 40.7  MCV 87.1 87.0 87.0  MCH 24.6* 24.3* 24.8*  MCHC 28.3* 28.0* 28.5*  RDW 28.1* 28.7* 28.4*  PLT 263 283 261    Cardiac EnzymesNo results for input(s): "TROPONINI" in the last 168 hours. No results for input(s): "TROPIPOC" in the last 168 hours.   BNP Recent Labs  Lab 06/18/23 1223  BNP 993.8*     DDimer No results for input(s): "DDIMER" in the last 168 hours.   Radiology    No results found.  Cardiac Studies   Echo   Patient Profile     73 y.o. female  hx of oxygen  dependent COPD, mitral stenosis s/p bioprosthetic MV replacement, R carotid artery stenosis s/p endarterectomy 2012, paroxysmal atrial fibrillation, GI bleeding due to AVM, pulmonary hypertension/RV failure, type 2 DM, PAD, renal cell cancer s/p left kidney cryoablation, tobacco use, who is admitted for acute HFpEF.   Assessment & Plan    Acute on chronic HFpEF RV failure  Pulmonary HTN group 2 and 3   Mitral stenosis s/p bioprosthetic valve replacement 2012  PAF OSA Tobacco use  PAD  DM   Clinically she still appears to be volume overloaded, but is improving. So far weight has dropped 5 kg since admission ( ~ -5500 ml) so far. She still need IV diuretics, but in the setting of the bump in cr will transition to intermittent IV doses ( Lasix  40mg  IV BID )   Tolerated the addition of the Aldactone 50 mg daily - just to monitor the cr here   Blood  pressure still elevated - will transition from Metoprolol  to Coreg. Will start Coreg at 12.mg BID.   She is in sinus rhythm today. Continue with rate control - note BB change above. Continue with warfarin - Pharmacy onboard for dosing.   Note MV disease with Bioprosthesis not a surgical candidate.     For questions or updates, please contact [facility] Please consult [email or web address] for contact info under Cardiology/STEMI.      Signed, Idan Prime, DO  06/23/2023, 10:24 AM

## 2023-06-23 NOTE — Progress Notes (Signed)
 SATURATION QUALIFICATIONS: (This note is used to comply with regulatory documentation for home oxygen )  Patient Saturations on Room Air at Rest = 88%  Patient Saturations on Room Air while Ambulating = 87%  Patient Saturations on 2 Liters of oxygen  while Ambulating = 92%  Please briefly explain why patient needs home oxygen : Pt. Desaturates when off oxygen  and ambulates more comfortably with oxygen  on.

## 2023-06-23 NOTE — Progress Notes (Signed)
 Physical Therapy Treatment Patient Details Name: [name] MRN: [id number] DOB: [birth year] Today's Date: 06/23/2023   History of Present Illness 73 y.o. female admitted 06/18/23 with LB edema, DOE and acute on chronic HFpEF. PMHx: COPD on O2, MVR, Rt CEA, PAF, CHF, HLD, GERD, HTN, GIB,  pulmonary hypertension/RV failure, T2DM, PAD, renal CA, tobacco use.    PT Comments  Pt lethargic on arrival needing increased cues to maintain arousal having slept through lunch arrival. Pt on 2L on arrival stating not consistently on O2 at home, attempted RA with desaturation to 86%, returned to 2L with pt able to maintain 93% during mobility. Pt with posterior LOB with gait even with RW, repeated falls PTA, decreased endurance and gait. Pt educated for repeated sit to stands and HEp to maximize strength and function. Will continue to follow.    If plan is discharge home, recommend the following: A little help with walking and/or transfers;A little help with bathing/dressing/bathroom;Assist for transportation;Assistance with cooking/housework   Can travel by private vehicle     Yes  Equipment Recommendations  None recommended by PT    Recommendations for Other Services       Precautions / Restrictions Precautions Precautions: Fall;Other (comment) Recall of Precautions/Restrictions: Impaired Precaution/Restrictions Comments: watch SpO2     Mobility  Bed Mobility Overal bed mobility: Modified Independent                  Transfers Overall transfer level: Needs assistance   Transfers: Sit to/from Stand Sit to Stand: Supervision           General transfer comment: cues for hand placement with pt repeatedly maintaining hands on RW with transfers despite cues. performed 5 repeated sit to stands from chair with pt continuing to maintain hands on RW and seated rest between each attempt    Ambulation/Gait Ambulation/Gait assistance: Contact guard assist Gait Distance (Feet):  150 Feet Assistive device: Rolling walker (2 wheels) Gait Pattern/deviations: Step-through pattern, Decreased stride length, Trunk flexed   Gait velocity interpretation: <1.8 ft/sec, indicate of risk for recurrent falls   General Gait Details: pt with posterior bias x 4 during gait requring tactile cues to recover balance, pt with reliance on RW and O2 for gait requiring 2L to maintain 93% during activity, encouragement to maximize distance with pt limited by fatigue and LB pain   Stairs             Wheelchair Mobility     Tilt Bed    Modified Rankin (Stroke Patients Only)       Balance Overall balance assessment: History of Falls, Needs assistance Sitting-balance support: No upper extremity supported, Feet supported Sitting balance-Leahy Scale: Good     Standing balance support: Bilateral upper extremity supported, During functional activity, Reliant on assistive device for balance Standing balance-Leahy Scale: Poor Standing balance comment: Rw in standing                            Communication Communication Communication: No apparent difficulties  Cognition Arousal: Lethargic Behavior During Therapy: Flat affect                           PT - Cognition Comments: Pt lethargic on arrival needing repeated cueing and stimulation to maintain attention, decreased awareness of safety and deficits, unable to answer where she wanted to D/C to Following commands: Intact      Cueing Cueing  Techniques: Verbal cues  Exercises General Exercises - Lower Extremity Long Arc Quad: AROM, Both, 10 reps, Seated    General Comments        Pertinent Vitals/Pain Pain Assessment Pain Assessment: 0-10 Pain Score: 4  Pain Location: legs Pain Descriptors / Indicators: Aching Pain Intervention(s): Limited activity within patient's tolerance, Monitored during session, Repositioned    Home Living                          Prior Function             PT Goals (current goals can now be found in the care plan section) Progress towards PT goals: Progressing toward goals    Frequency    Min 2X/week      PT Plan      Co-evaluation              AM-PAC PT "6 Clicks" Mobility   Outcome Measure  Help needed turning from your back to your side while in a flat bed without using bedrails?: A Little Help needed moving from lying on your back to sitting on the side of a flat bed without using bedrails?: A Little Help needed moving to and from a bed to a chair (including a wheelchair)?: A Little Help needed standing up from a chair using your arms (e.g., wheelchair or bedside chair)?: A Little Help needed to walk in hospital room?: A Little Help needed climbing 3-5 steps with a railing? : A Lot 6 Click Score: 17    End of Session Equipment Utilized During Treatment: Oxygen  Activity Tolerance: Patient tolerated treatment well Patient left: with call bell/phone within reach;in chair;with chair alarm set Nurse Communication: Mobility status PT Visit Diagnosis: History of falling (Z91.81);Difficulty in walking, not elsewhere classified (R26.2);Muscle weakness (generalized) (M62.81);Other abnormalities of gait and mobility (R26.89)     Time: 1610-9604 PT Time Calculation (min) (ACUTE ONLY): 30 min  Charges:    $Gait Training: 8-22 mins $Therapeutic Activity: 8-22 mins PT General Charges $$ ACUTE PT VISIT: 1 Visit                     Annis Baseman, PT Acute Rehabilitation Services Office: [phone]    Jackey Mary Epsie Walthall 06/23/2023, 1:43 PM

## 2023-06-23 NOTE — Progress Notes (Signed)
 Patient ID: [name], female   DOB: Jan 21, 1951, 73 y.o.   MRN: [id number]    Progress Note from the Palliative Medicine Team at [facility]   Patient Name: [name]        Date: 06/23/2023 DOB: 11-14-1950  Age: 73 y.o. MRN#: [id number] Attending Physician: Kathryn Cross, MD Primary Care Physician: Kathryn Dixie, MD Admit Date: 06/18/2023   Reason for Consultation/Follow-up   Establishing Goals of Care   HPI/ Brief Hospital Review  73 y.o. female   admitted on 06/18/2023 with past medical history significant of R carotid artery stenosis s/p endarterectomy, s/p MVR w/ bioprosthetic MV, HFpEF, pAF, and emphysema    Pt reports continued physical and functional decline over many months.    She reports having increased SOB, BLE edema to calves, and 2 pillow orthopnea.  She lives alone with support of her sister and daughter.     Patient faces treatment option decisions, advanced directive decisions and anticipatory care needs.       Subjective  Extensive chart review has been completed prior to meeting with patient/family  including labs, vital signs, imaging, progress/consult notes, orders, medications and available advance directive documents.    This NP assessed patient at the bedside as a follow up to  yesterday's GOCs meeting.  [name] "feels better" today.  She is working with PT/OT and is hopeful to transition to SNF for rehabilitation.    Ongoing exploration of [name]'s thoughts and feelings regarding her medical situation and increasing care needs.     She avoids discussion regarding advanced care planning topics.  Education offered today regarding  the importance of continued conversation with her family and her  medical providers regarding overall plan of care   ensuring decisions are within the context of the patients values and GOCs.  Recommend OP Palliative services at discharge  Questions and concerns addressed   Discussed with  primary team and nursing staff   Time: 25  minutes  Detailed review of medical records ( labs, imaging, vital signs), medically appropriate exam ( MS, skin, cardiac,  resp)   discussed with treatment team, counseling and education to patient, family, staff, documenting clinical information, medication management, coordination of care    Kathryn Fireman NP  Palliative Medicine Team Team Phone # [phone] Pager [phone]

## 2023-06-24 DIAGNOSIS — I5033 Acute on chronic diastolic (congestive) heart failure: Secondary | ICD-10-CM | POA: Diagnosis not present

## 2023-06-24 LAB — BASIC METABOLIC PANEL WITH GFR
Anion gap: 9 (ref 5–15)
BUN: 41 mg/dL — ABNORMAL HIGH (ref 8–23)
CO2: 35 mmol/L — ABNORMAL HIGH (ref 22–32)
Calcium: 9.7 mg/dL (ref 8.9–10.3)
Chloride: 94 mmol/L — ABNORMAL LOW (ref 98–111)
Creatinine, Ser: 0.99 mg/dL (ref 0.44–1.00)
GFR, Estimated: 60 mL/min — ABNORMAL LOW (ref >60)
Glucose, Bld: 178 mg/dL — ABNORMAL HIGH (ref 70–99)
Potassium: 5 mmol/L (ref 3.5–5.1)
Sodium: 138 mmol/L (ref 135–145)

## 2023-06-24 LAB — CBC
HCT: 43.8 % (ref 36.0–46.0)
Hemoglobin: 12.4 g/dL (ref 12.0–15.0)
MCH: 24.8 pg — ABNORMAL LOW (ref 26.0–34.0)
MCHC: 28.3 g/dL — ABNORMAL LOW (ref 30.0–36.0)
MCV: 87.6 fL (ref 80.0–100.0)
Platelets: 308 10*3/uL (ref 150–400)
RBC: 5 MIL/uL (ref 3.87–5.11)
RDW: 28.3 % — ABNORMAL HIGH (ref 11.5–15.5)
WBC: 10.2 10*3/uL (ref 4.0–10.5)
nRBC: 0 % (ref 0.0–0.2)

## 2023-06-24 LAB — PROTIME-INR
INR: 1.5 — ABNORMAL HIGH (ref 0.8–1.2)
Prothrombin Time: 17.9 s — ABNORMAL HIGH (ref 11.4–15.2)

## 2023-06-24 LAB — GLUCOSE, CAPILLARY
Glucose-Capillary: 127 mg/dL — ABNORMAL HIGH (ref 70–99)
Glucose-Capillary: 188 mg/dL — ABNORMAL HIGH (ref 70–99)
Glucose-Capillary: 263 mg/dL — ABNORMAL HIGH (ref 70–99)
Glucose-Capillary: 195 mg/dL — ABNORMAL HIGH (ref 70–99)

## 2023-06-24 MED ORDER — WARFARIN SODIUM 7.5 MG PO TABS
7.5000 mg | ORAL_TABLET | Freq: Once | ORAL | Status: AC
  Administered 2023-06-24: 7.5 mg via ORAL
  Filled 2023-06-24: qty 1

## 2023-06-24 MED ORDER — IPRATROPIUM-ALBUTEROL 0.5-2.5 (3) MG/3ML IN SOLN: 3.0000 mL | Freq: Four times a day (QID) | RESPIRATORY_TRACT | Status: DC | PRN

## 2023-06-24 MED ORDER — ACETAZOLAMIDE SODIUM 500 MG IJ SOLR
500.0000 mg | Freq: Every day | INTRAMUSCULAR | Status: DC
  Administered 2023-06-24: 500 mg via INTRAVENOUS
  Filled 2023-06-24 (×2): qty 500

## 2023-06-24 NOTE — Progress Notes (Signed)
 Subjective Tired but feels okay overall.   Physical exam Blood pressure (!) 130/59, pulse 68, temperature 97.7 F (36.5 C), temperature source Oral, resp. rate 16, height 5\' 2"  (1.575 m), weight 60.1 kg, SpO2 96%.  Constitutional: sitting up in bed, in no acute distress Cardiovascular: regular rate and rhythm, no m/r/g, bilateral LEE  Pulmonary/Chest: normal work of breathing on 2 L [place], crackles bilaterally Neurological: alert & oriented Skin: bilateral LE erythema improved  Weight change: -0.1 kg   Intake/Output Summary (Last 24 hours) at 06/24/2023 0954 Last data filed at 06/24/2023 0320 Gross per 24 hour  Intake 552 ml  Output 2050 ml  Net -1498 ml   Net IO Since Admission: -7,022.36 mL [06/24/23 0954]  Labs, images, and other studies    Latest Ref Rng & Units 06/24/2023    2:44 AM 06/23/2023    3:11 AM 06/22/2023    2:36 AM  CBC  WBC 4.0 - 10.5 K/uL 10.2  7.6  8.7   Hemoglobin 12.0 - 15.0 g/dL 24.4  01.0  27.2   Hematocrit 36.0 - 46.0 % 43.8  40.7  42.9   Platelets 150 - 400 K/uL 308  261  283        Latest Ref Rng & Units 06/24/2023    2:44 AM 06/23/2023    3:11 AM 06/22/2023    2:36 AM  BMP  Glucose 70 - 99 mg/dL 536  644  034   BUN 8 - 23 mg/dL 41  35  26   Creatinine 0.44 - 1.00 mg/dL 7.42  5.95  6.38   Sodium 135 - 145 mmol/L 138  140  140   Potassium 3.5 - 5.1 mmol/L 5.0  4.6  3.9   Chloride 98 - 111 mmol/L 94  96  96   CO2 22 - 32 mmol/L 35  37  32   Calcium  8.9 - 10.3 mg/dL 9.7  9.3  9.4      Assessment and plan Hospital day 6  Rosenda Arminta Grime is a 73 y.o.female pertinent PMH of R carotid artery stenosis s/p endarterectomy, s/p MVR, HFpEF, pAF on warfarin, tobacco use disorder, T2DM, and COPD/emphysema on 2 L [place] at bedtime, who presented from the [facility] with bilateral lower extremity edema and admitted for HFpEF exacerbation.    Acute on chronic HFpEF Pulmonary hypertension Cardiology following, appreciate recommendations. 1.7 L  urine output yesterday. Down ~ 10 lbs since admission (she is unsure of dry weight). Still volume overloaded on exam. -Continue IV Lasix  40 BID. Losartan  50, Coreg 12.5 BID, and Aldactone 50. Given alkalosis, will add IV Acetazolamide 500 mg today per ADVOR study to aid in diuresis. Cardiology considering Entresto/Jardiance  and pharmacy looking into cost. - Strict I&Os, daily weights - Daily BMP, Mg   Paroxysmal afib S/P mitral valve repair Hypertension Meds per above. Warfarin per pharmacy for MV stenosis s/p bioprothetic valve (2012). INR remains subtherapeutic. BP looks better today after starting Coreg.    PAD ABI's from 05/2023 show R>L PAD. LE exam consistent with ischemic rubor. LDL is 91. Not a candidate for ASA while on Warfarin. Will continue to encourage smoking cessation as this appears to be only modifiable risk factor presently.    Emphysema Chronic hypoxic respiratory On Breztri  here (Trelegy at home) and DuoNebs prn. - Maintain SpO2 between 88-92% - Incentive spirometry  - Consider pulmonary rehab outpatient   T2DM A1c on  admission is 5.9. CBG's above goal. -Semglee  to 36 units daily + SSI   Depression - Continue venlafaxine , wellbutrin , ramelteon , and xanax    Iron  deficiency anemia Stable at 12.4 today. Has been receiving IV iron  as an outpatient with last infusion on 4/17.      Chronic pain Neuralgia -Continue home Norco 5-325 two tablets q6h PRN and gabapentin  600 mg at bedtime.    Constipation - Continue linzess  - Miralax  daily PRN   Goals of Care Palliative has been consulted, remains full code, full scope. TOC following for SNF placement, pending.  Ronni Colace, DO 06/24/2023, 9:54 AM  Pager: [phone] After 5pm or weekend: [phone]

## 2023-06-24 NOTE — Progress Notes (Addendum)
 PHARMACY - ANTICOAGULATION CONSULT NOTE  Pharmacy Consult for warfarin dosing. Indication: atrial fibrillation  Allergies  Allergen Reactions   Dexilant [Dexlansoprazole] Other (See Comments)    Made acid reflux worse   Metoclopramide      Other Reaction(s): unknown   Isovue  [Iopamidol ] Itching and Rash   Lorazepam Other (See Comments)    Patient's sister noted that ativan caused the patient to become extremely confused during hospitalization 09/2010; tolerates Xanax    Oxycontin  [Oxycodone ] Other (See Comments)    headache   Tramadol Hcl Swelling    Ankle swelling    Patient Measurements: Height: 5\' 2"  (157.5 cm) Weight: 60.1 kg (132 lb 7.9 oz) IBW/kg (Calculated) : 50.1 HEPARIN  DW (KG): 63.3  Vital Signs: Temp: 98 F (36.7 C) (05/14 0309) Temp Source: Oral (05/14 0309) BP: 125/98 (05/14 0309) Pulse Rate: 69 (05/14 0309)  Labs: Recent Labs    06/22/23 0236 06/23/23 0311 06/24/23 0244  HGB 12.0 11.6* 12.4  HCT 42.9 40.7 43.8  PLT 283 261 308  LABPROT 17.3* 18.0* 17.9*  INR 1.4* 1.5* 1.5*  CREATININE 0.98 1.13* 0.99    Estimated Creatinine Clearance: 39.4 mL/min (by C-G formula based on SCr of 0.99 mg/dL).   Medical History: Past Medical History:  Diagnosis Date   Abnormality of lung on CXR 02/02/2020   Nonspecific finding on CXR ordered by pulmonologist - c/w inflammation vs infection, f/u imaging suggested, Dr. Antonette Batters office has been in communication about recommended next steps.   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   Non-bleeding when visualized on capsule endoscopy 06/30/2015    Arthritis    "lower back; hands" (02/19/2018)   Asthma    Asymptomatic cholelithiasis 09/25/2015   Seen on CT scan 08/2015   Carotid artery stenosis; s/p R endarterectomy    s/p right endarterectomy (06/2010) Carotid US  (07/2010):  Left:  Moderate-to-severe (60-79%) calcific and non-calcific plaque origin and proximal ICA and ECA    Chronic congestive heart failure with left ventricular diastolic dysfunction (HCC) 10/21/2010   Chronic constipation 02/03/2011   Chronic daily headache 01/16/2014   Chronic iron  deficiency anemia    Chronic low back pain 10/06/2012   Chronic venous insufficiency 08/04/2012   Closed fracture of one rib of left side 08/23/2021   ED visit after a fall 08/16/21    COPD (chronic obstructive pulmonary disease) with emphysema (HCC)    PFTs 2018: severe obstructive disease, insignif response to bronchiodilator, mild restriction parenchymal pattern, moderately severe diffusion defect. 2014  FEV1 0.92 (40%), ratio 69, 27% increase in FEV1 with BD, TLC 91%, severe airtrapping, DLCO49% On chronic home O2. Pulmonary rehab referral 05/2012    Dyspnea    Fibromyalgia 08/29/2010   Gastroesophageal reflux disease    History of blood transfusion    "several times"  (02/19/2018)   History of clear cell renal cell carcinoma (HCC), in remission 07/21/2011   s/p cryoablation of left RCC in 09/2011 by Dr. Nereida Banning. Followed by Dr. Joie Narrow  [facility]) .     History of fracture of left hip 10/17/2022   History of hiatal hernia    History of mitral valve replacement with bioprosthetic valve due to mitral stenosis 2012   s/p MVR with a 27-mm pericardial porcine valve (Medtronic Mosaic valve, serial #[id number] on 09/20/10, Dr. Matt Song)    History of obstructive sleep apnea, resolved 2013   resolved per sleep study 07/2019; no  apnea, but did have desaturation.  CPAP no longer necessary.  Nocturnal polysomnography (06/2009): Moderate sleep apnea/ hypopnea syndrome , AHI 17.8 per hour with nonpositional hypopneas. CPAP titration to 12 CWP, AHI 2.4 per hour. On nocturnal CPAP via a small resMed Quattro full-face mask with heated humidifier.    History of pneumonia    "once"  (02/19/2018)   History of seborrheic keratosis  09/28/2015   History of stroke without residual deficits 01/17/2022   Hyperlipidemia LDL goal < 100 11/20/2005   Internal and external hemorrhoids without complication 08/04/2012   Lesion of left native kidney 06/01/2020   Incidental finding on recent screening chest CT for lung ca ordered by Dr. Linder Revere pulmonology - he has ordered a f/u renal U/S.    Lichen sclerosus of female genitalia 01/12/2017   Migraine    "none in years" (02/19/2018)   Mild cognitive impairment with memory loss 12/23/2021   Moderately severe major depression (HCC) 11/19/2005   Nocturnal hypoxia per sleep study 07/2019    Osteoporosis    DEXA 2016: T -2.7; DEXA (12/09/2011): L-spine T -3.7, left hip T -1.4 DEXA (12/2004): L-spine T -2.6, left hip -0.1    Paroxysmal atrial fibrillation (HCC) 10/22/2010   s/p Left atrial maze procedure for paroxysmal atrial fibrillation on 09/20/2010 by Dr Matt Song.  Subsequent splenic infarct, decision was made to re-anticoagulate with coumadin , likely life-long as this is the most likely cause of the splenic infarct.    Personal history of colonic polyps 05/14/2011   Colonoscopy (05/2011): 4 mm adenomatous polyp excised endoscopically Colonoscopy (02/2002): Adenomatous polyp excised endoscopically    Personal history of renal cell carcinoma 09/12/2020   Pneumonia    Pulmonary hypertension due to chronic obstructive pulmonary disease (HCC) 04/25/2016   2014 TEE w PA peak pressure 46 mmHg, s/p MV replacement    Right nephrolithiasis, asymptomatic, incidental finding 09/06/2014   5 mm non-obstructing calculus seen on CT scan 09/05/2014    Right ventricular failure (HCC) 04/25/2016   Severe obesity (BMI 35.0-39.9) with comorbidity (HCC) 10/23/2011   Sleep apnea    Stage 2 chronic kidney disease due to type 2 diabetes mellitus (HCC) 12/16/2018   CKD stage III     Tobacco abuse 07/28/2012   Type 2 diabetes mellitus with complication, with long-term current use of insulin  (HCC)    Type 2  diabetes mellitus with diabetic neuropathy (HCC)    Weight loss due to medication 03/12/2023    Medications:  Scheduled:   acetaminophen   1,000 mg Oral Once   ALPRAZolam   0.5 mg Oral QHS   budeson-glycopyrrolate -formoterol   2 puff Inhalation BID   buPROPion   150 mg Oral BH-q7a   carvedilol  12.5 mg Oral BID WC   furosemide   40 mg Intravenous BID   gabapentin   600 mg Oral QHS   insulin  aspart  0-15 Units Subcutaneous TID WC   insulin  glargine-yfgn  36 Units Subcutaneous Daily   ipratropium-albuterol   3 mL Nebulization BID   linaclotide   72 mcg Oral QAC breakfast   losartan   50 mg Oral Daily   multivitamin with minerals  1 tablet Oral Daily   nicotine   14 mg Transdermal Daily   pantoprazole   40 mg Oral Daily   Ensure Max Protein  11 oz Oral BID   ramelteon   8 mg Oral QHS   spironolactone  50 mg Oral Daily   venlafaxine  XR  150 mg Oral Q breakfast   Warfarin - Pharmacist Dosing Inpatient   Does not apply  q1600    Assessment: INR at 0244 is 1.5 remaining recalcitrant to dose escalation after two consecutive doses of 5 mg warfarin for the previous two days. Drug-Drug interaction checker reveals no DDI's that would be Cytochrome P450 enzyme inducing drugs that would account for the continued sub-therapeutic response.   Goal of Therapy:  INR target range 2.0 - 2.5 as established by the patient[facility], Dr. Burnell Carry. Williams.     Plan:  Will give 7.5 mg warfarin today. May consider a "bridging" dose of SQ enoxaparin  at 1 mg/kg until such time the vitamin K dependent clotting Factors as suppressed quantum sufficient. Will discuss with the IMTS/B1-Herring Team.   [name], PharmD, CPP Clinical Pharmacist Practitioner 06/24/2023,7:11 AM

## 2023-06-24 NOTE — Progress Notes (Signed)
 Mobility Specialist: Progress Note   06/24/23 1140  Mobility  Activity Ambulated with assistance in hallway  Level of Assistance Contact guard assist, steadying assist  Assistive Device Front wheel walker  Distance Ambulated (ft) 80 ft  Activity Response Tolerated fair  Mobility Referral Yes  Mobility visit 1 Mobility  Mobility Specialist Start Time (ACUTE ONLY) 1003  Mobility Specialist Stop Time (ACUTE ONLY) 1020  Mobility Specialist Time Calculation (min) (ACUTE ONLY) 17 min    Pt received on EOB, agreeable to mobility session with encouragement. MinG for STS with cues needed for hand placement. MinG for ambulation, mild unsteadiness with 2x LOB, pt leaned on the wall and the sink to recover. SpO2 desat to 87-88% on 2LO2, titrated pt to 3L and she maintained SpO2 90-91% during the remainder of ambulation. Returned to room without fault. Left on EOB with all needs met, call bell in reach.   Deloria Fetch Mobility Specialist Please contact via SecureChat or Rehab office at [phone]

## 2023-06-24 NOTE — TOC Progression Note (Addendum)
 Transition of Care [facility]) - Progression Note    Patient Details  Name: [name] MRN: [id number] Date of Birth: [birth year]  Transition of Care [facility]) CM/SW Contact  Arron Big, [place] Phone Number: 06/24/2023, 2:23 PM  Clinical Narrative:   CSW left VM for HTA to start insurance auth for patient.  3:02 PM CSW started auth with HTA at this time.   TOC will continue to follow.    Expected Discharge Plan: Skilled Nursing Facility Barriers to Discharge: Continued Medical Work up  Expected Discharge Plan and Services     Post Acute Care Choice: Skilled Nursing Facility Living arrangements for the past 2 months: Single Family Home                                       Social Determinants of Health (SDOH) Interventions SDOH Screenings   Food Insecurity: No Food Insecurity (06/18/2023)  Recent Concern: Food Insecurity - Food Insecurity Present (05/25/2023)  Housing: Low Risk  (06/18/2023)  Transportation Needs: No Transportation Needs (06/18/2023)  Recent Concern: Transportation Needs - Unmet Transportation Needs (04/08/2023)  Utilities: Not At Risk (06/18/2023)  Alcohol Screen: Low Risk  (04/08/2023)  Depression (PHQ2-9): High Risk (04/08/2023)  Financial Resource Strain: Low Risk  (04/08/2023)  Physical Activity: Inactive (04/08/2023)  Social Connections: Socially Isolated (06/18/2023)  Stress: Stress Concern Present (04/08/2023)  Tobacco Use: High Risk (06/18/2023)  Health Literacy: Adequate Health Literacy (04/08/2023)    Readmission Risk Interventions     No data to display

## 2023-06-24 NOTE — Progress Notes (Signed)
 Progress Note  Patient Name: [name] Date of Encounter: 06/24/2023  Primary Cardiologist: None   Subjective   Patient seen and examined at her bedside.  Inpatient Medications    Scheduled Meds:  acetaminophen   1,000 mg Oral Once   ALPRAZolam   0.5 mg Oral QHS   budeson-glycopyrrolate -formoterol   2 puff Inhalation BID   buPROPion   150 mg Oral BH-q7a   carvedilol  12.5 mg Oral BID WC   furosemide   40 mg Intravenous BID   gabapentin   600 mg Oral QHS   insulin  aspart  0-15 Units Subcutaneous TID WC   insulin  glargine-yfgn  36 Units Subcutaneous Daily   ipratropium-albuterol   3 mL Nebulization BID   linaclotide   72 mcg Oral QAC breakfast   losartan   50 mg Oral Daily   multivitamin with minerals  1 tablet Oral Daily   nicotine   14 mg Transdermal Daily   pantoprazole   40 mg Oral Daily   Ensure Max Protein  11 oz Oral BID   ramelteon   8 mg Oral QHS   spironolactone  50 mg Oral Daily   venlafaxine  XR  150 mg Oral Q breakfast   warfarin  7.5 mg Oral ONCE-1600   Warfarin - Pharmacist Dosing Inpatient   Does not apply q1600   Continuous Infusions:   PRN Meds: guaiFENesin , HYDROcodone -acetaminophen , menthol -cetylpyridinium, ondansetron  (ZOFRAN ) IV, polyethylene glycol   Vital Signs    Vitals:   06/24/23 0035 06/24/23 0309 06/24/23 0319 06/24/23 0725  BP: 107/60 (!) 125/98  (!) 130/59  Pulse: 67 69  73  Resp: 20 19  17   Temp: 97.6 F (36.4 C) 98 F (36.7 C)  97.7 F (36.5 C)  TempSrc: Oral Oral  Oral  SpO2: 93% 95%  92%  Weight:   60.1 kg   Height:        Intake/Output Summary (Last 24 hours) at 06/24/2023 0914 Last data filed at 06/24/2023 0320 Gross per 24 hour  Intake 789 ml  Output 2050 ml  Net -1261 ml   Filed Weights   06/22/23 0505 06/23/23 0500 06/24/23 0319  Weight: 62 kg 60.2 kg 60.1 kg    Telemetry     - Personally Reviewed  ECG     - Personally Reviewed  Physical Exam     General: Comfortable, awake in bed - getting ready  to walk with her nurse Head: Atraumatic, normal size  Eyes: PEERLA, EOMI  Neck: Supple, normal JVD Cardiac: Normal S1, S2; RRR; no murmurs, rubs, or gallops Lungs: Clear to auscultation bilaterally Abd: Soft, nontender, no hepatomegaly  Ext: warm, +2-3 LE edema bilaterally , erythema  Musculoskeletal: No deformities, BUE and BLE strength normal and equal Skin: Warm and dry, no rashes   Neuro: Alert and oriented to person, place, time, and situation, CNII-XII grossly intact, no focal deficits  Psych: Normal mood and affect   Labs    Chemistry Recent Labs  Lab 06/22/23 0236 06/23/23 0311 06/24/23 0244  NA 140 140 138  K 3.9 4.6 5.0  CL 96* 96* 94*  CO2 32 37* 35*  GLUCOSE 248* 196* 178*  BUN 26* 35* 41*  CREATININE 0.98 1.13* 0.99  CALCIUM  9.4 9.3 9.7  GFRNONAA >60 51* 60*  ANIONGAP 12 7 9      Hematology Recent Labs  Lab 06/22/23 0236 06/23/23 0311 06/24/23 0244  WBC 8.7 7.6 10.2  RBC 4.93 4.68 5.00  HGB 12.0 11.6* 12.4  HCT 42.9 40.7 43.8  MCV 87.0 87.0 87.6  MCH  24.3* 24.8* 24.8*  MCHC 28.0* 28.5* 28.3*  RDW 28.7* 28.4* 28.3*  PLT 283 261 308    Cardiac EnzymesNo results for input(s): "TROPONINI" in the last 168 hours. No results for input(s): "TROPIPOC" in the last 168 hours.   BNP Recent Labs  Lab 06/18/23 1223  BNP 993.8*     DDimer No results for input(s): "DDIMER" in the last 168 hours.   Radiology    No results found.  Cardiac Studies   Echo   Patient Profile     73 y.o. female  hx of oxygen  dependent COPD, mitral stenosis s/p bioprosthetic MV replacement, R carotid artery stenosis s/p endarterectomy 2012, paroxysmal atrial fibrillation, GI bleeding due to AVM, pulmonary hypertension/RV failure, type 2 DM, PAD, renal cell cancer s/p left kidney cryoablation, tobacco use, who is admitted for acute HFpEF.   Assessment & Plan    Acute on chronic HFpEF RV failure  Pulmonary HTN group 2 and 3  Mitral stenosis s/p bioprosthetic valve  replacement 2012  PAF OSA Tobacco use  PAD  DM   Clinically she still appears to be volume overloaded, but is improving. So far weight has dropped 5 kg since admission ( ~ - 7742.1 ml) so far.  I think 1 more day of IV diuretics will be critical here.  Plan to transition her to p.o.  Foley her creatinine is back to normal.  Tolerated the addition of the Aldactone 50 mg daily - just to monitor the cr here   Blood  pressure has improved with the transition from metoprolol  to carvedilol.  She is in sinus rhythm today. Continue with rate control - note BB change above. Continue with warfarin - Pharmacy onboard for dosing.   Note MV disease with Bioprosthesis not a surgical candidate.     For questions or updates, please contact [facility] Please consult [email or web address] for contact info under Cardiology/STEMI.      Signed, Kailen Name, DO  06/24/2023, 9:14 AM

## 2023-06-25 DIAGNOSIS — I480 Paroxysmal atrial fibrillation: Secondary | ICD-10-CM | POA: Diagnosis not present

## 2023-06-25 DIAGNOSIS — J9611 Chronic respiratory failure with hypoxia: Secondary | ICD-10-CM

## 2023-06-25 DIAGNOSIS — I5033 Acute on chronic diastolic (congestive) heart failure: Secondary | ICD-10-CM | POA: Diagnosis not present

## 2023-06-25 DIAGNOSIS — I110 Hypertensive heart disease with heart failure: Secondary | ICD-10-CM | POA: Diagnosis not present

## 2023-06-25 DIAGNOSIS — J439 Emphysema, unspecified: Secondary | ICD-10-CM

## 2023-06-25 DIAGNOSIS — G8929 Other chronic pain: Secondary | ICD-10-CM

## 2023-06-25 DIAGNOSIS — I272 Pulmonary hypertension, unspecified: Secondary | ICD-10-CM | POA: Diagnosis not present

## 2023-06-25 LAB — GLUCOSE, CAPILLARY
Glucose-Capillary: 157 mg/dL — ABNORMAL HIGH (ref 70–99)
Glucose-Capillary: 412 mg/dL — ABNORMAL HIGH (ref 70–99)
Glucose-Capillary: 76 mg/dL (ref 70–99)
Glucose-Capillary: 258 mg/dL — ABNORMAL HIGH (ref 70–99)

## 2023-06-25 LAB — CBC
HCT: 42.6 % (ref 36.0–46.0)
Hemoglobin: 12 g/dL (ref 12.0–15.0)
MCH: 24.5 pg — ABNORMAL LOW (ref 26.0–34.0)
MCHC: 28.2 g/dL — ABNORMAL LOW (ref 30.0–36.0)
MCV: 87.1 fL (ref 80.0–100.0)
Platelets: 293 10*3/uL (ref 150–400)
RBC: 4.89 MIL/uL (ref 3.87–5.11)
RDW: 27.3 % — ABNORMAL HIGH (ref 11.5–15.5)
WBC: 15.8 10*3/uL — ABNORMAL HIGH (ref 4.0–10.5)
nRBC: 0 % (ref 0.0–0.2)

## 2023-06-25 LAB — BASIC METABOLIC PANEL WITH GFR
Anion gap: 10 (ref 5–15)
BUN: 46 mg/dL — ABNORMAL HIGH (ref 8–23)
CO2: 31 mmol/L (ref 22–32)
Calcium: 9.9 mg/dL (ref 8.9–10.3)
Chloride: 95 mmol/L — ABNORMAL LOW (ref 98–111)
Creatinine, Ser: 1.07 mg/dL — ABNORMAL HIGH (ref 0.44–1.00)
GFR, Estimated: 55 mL/min — ABNORMAL LOW (ref >60)
Glucose, Bld: 183 mg/dL — ABNORMAL HIGH (ref 70–99)
Potassium: 4.9 mmol/L (ref 3.5–5.1)
Sodium: 136 mmol/L (ref 135–145)

## 2023-06-25 LAB — PROTIME-INR
INR: 1.7 — ABNORMAL HIGH (ref 0.8–1.2)
Prothrombin Time: 20.1 s — ABNORMAL HIGH (ref 11.4–15.2)

## 2023-06-25 LAB — MAGNESIUM: Magnesium: 2.5 mg/dL — ABNORMAL HIGH (ref 1.7–2.4)

## 2023-06-25 LAB — GLUCOSE, RANDOM: Glucose, Bld: 326 mg/dL — ABNORMAL HIGH (ref 70–99)

## 2023-06-25 MED ORDER — FUROSEMIDE 40 MG PO TABS
80.0000 mg | ORAL_TABLET | Freq: Every day | ORAL | Status: DC
  Administered 2023-06-25 – 2023-06-27 (×3): 80 mg via ORAL
  Filled 2023-06-25 (×3): qty 2

## 2023-06-25 MED ORDER — ENSURE MAX PROTEIN PO LIQD
11.0000 [oz_av] | Freq: Every day | ORAL | Status: DC
  Administered 2023-06-25 – 2023-06-30 (×5): 11 [oz_av] via ORAL
  Filled 2023-06-25 (×7): qty 330

## 2023-06-25 NOTE — Progress Notes (Signed)
 Subjective SOB continues to improve.  Physical exam Blood pressure (!) 139/48, pulse 85, temperature 97.7 F (36.5 C), temperature source Oral, resp. rate 16, height 5\' 2"  (1.575 m), weight 59.1 kg, SpO2 98%.  Constitutional: sitting up in bed, in no acute distress Cardiovascular: regular rate and rhythm, no m/r/g, bilateral LEE  Pulmonary/Chest: normal work of breathing on 2 L [place], crackles bilaterally Neurological: alert & oriented Skin: bilateral LE erythema improved  Weight change: -1 kg   Intake/Output Summary (Last 24 hours) at 06/25/2023 1117 Last data filed at 06/25/2023 0900 Gross per 24 hour  Intake 597 ml  Output 1000 ml  Net -403 ml   Net IO Since Admission: -7,068.36 mL [06/25/23 1117]  Labs, images, and other studies    Latest Ref Rng & Units 06/25/2023    2:51 AM 06/24/2023    2:44 AM 06/23/2023    3:11 AM  CBC  WBC 4.0 - 10.5 K/uL 15.8  10.2  7.6   Hemoglobin 12.0 - 15.0 g/dL 82.9  56.2  13.0   Hematocrit 36.0 - 46.0 % 42.6  43.8  40.7   Platelets 150 - 400 K/uL 293  308  261        Latest Ref Rng & Units 06/25/2023    2:51 AM 06/24/2023    2:44 AM 06/23/2023    3:11 AM  BMP  Glucose 70 - 99 mg/dL 865  784  696   BUN 8 - 23 mg/dL 46  41  35   Creatinine 0.44 - 1.00 mg/dL 2.95  2.84  1.32   Sodium 135 - 145 mmol/L 136  138  140   Potassium 3.5 - 5.1 mmol/L 4.9  5.0  4.6   Chloride 98 - 111 mmol/L 95  94  96   CO2 22 - 32 mmol/L 31  35  37   Calcium  8.9 - 10.3 mg/dL 9.9  9.7  9.3      Assessment and plan Hospital day 7  Chyrl Arminta Brande is a 73 y.o.female pertinent PMH of R carotid artery stenosis s/p endarterectomy, s/p MVR, HFpEF, pAF on warfarin, tobacco use disorder, T2DM, and COPD/emphysema on 2 L [place] at bedtime, who presented from the [facility] with bilateral lower extremity edema and admitted for HFpEF exacerbation.    Acute on chronic HFpEF Pulmonary hypertension Cardiology following, appreciate recommendations. Nearing  euvolemia.  Down ~ 12 lbs since admission (she is unsure of dry weight).  -Transition IV Lasix  to po Lasix  80 mg/daily. -Continue Losartan  50, Coreg 12.5 BID, and Aldactone 50. Cardiology considering Entresto/Jardiance  and pharmacy looking into cost. - Strict I&Os, daily weights - Daily BMP, Mg   Paroxysmal afib S/P mitral valve repair Hypertension Meds per above. Warfarin per pharmacy for MV stenosis s/p bioprothetic valve (2012). INR remains subtherapeutic. BP looks better after starting Coreg. 5/13   PAD ABI's from 05/2023 show R>L PAD. LE exam consistent with ischemic rubor though this seems to be improving. LDL is 91. Not a candidate for ASA while on Warfarin. Will continue to encourage smoking cessation as this appears to be only modifiable risk factor presently.    Emphysema Chronic hypoxic respiratory On Breztri  here (Trelegy at home) and DuoNebs prn. - Maintain SpO2 between 88-92% - Incentive spirometry  - Consider pulmonary rehab outpatient   T2DM A1c on admission is 5.9. CBG's within goal. -Semglee  to 36 units daily + SSI  Leukocytosis  15.8 as of this morning. Afebrile. No signs of infection.  -Trend   Depression - Continue venlafaxine , wellbutrin , ramelteon , and xanax    Iron  deficiency anemia Stable at 12.0 today. Has been receiving IV iron  as an outpatient with last infusion on 4/17.    Chronic pain Neuralgia -Continue home Norco 5-325 two tablets q6h PRN and gabapentin  600 mg at bedtime.    Constipation - Continue linzess  - Miralax  daily PRN   Goals of Care Palliative has been consulted, remains full code, full scope. TOC following for SNF placement, pending.   Ronni Colace, DO 06/25/2023, 11:17 AM  Pager: [phone] After 5pm or weekend: [phone]

## 2023-06-25 NOTE — Progress Notes (Signed)
 PHARMACY - ANTICOAGULATION CONSULT NOTE  Pharmacy Consult for warfarin management. Indication: atrial fibrillation  Allergies  Allergen Reactions   Dexilant [Dexlansoprazole] Other (See Comments)    Made acid reflux worse   Metoclopramide      Other Reaction(s): unknown   Isovue  [Iopamidol ] Itching and Rash   Lorazepam Other (See Comments)    Patient's sister noted that ativan caused the patient to become extremely confused during hospitalization 09/2010; tolerates Xanax    Oxycontin  [Oxycodone ] Other (See Comments)    headache   Tramadol Hcl Swelling    Ankle swelling    Patient Measurements: Height: 5\' 2"  (157.5 cm) Weight: 59.1 kg (130 lb 4.7 oz) IBW/kg (Calculated) : 50.1 HEPARIN  DW (KG): 63.3  Vital Signs: Temp: 97.6 F (36.4 C) (05/15 0607) Temp Source: Oral (05/15 0607) BP: 139/65 (05/15 0607) Pulse Rate: 80 (05/15 0019)  Labs: Recent Labs    06/23/23 0311 06/24/23 0244 06/25/23 0251  HGB 11.6* 12.4 12.0  HCT 40.7 43.8 42.6  PLT 261 308 293  LABPROT 18.0* 17.9* 20.1*  INR 1.5* 1.5* 1.7*  CREATININE 1.13* 0.99 1.07*    Estimated Creatinine Clearance: 36.5 mL/min (A) (by C-G formula based on SCr of 1.07 mg/dL (H)).   Medical History: Past Medical History:  Diagnosis Date   Abnormality of lung on CXR 02/02/2020   Nonspecific finding on CXR ordered by pulmonologist - c/w inflammation vs infection, f/u imaging suggested, Dr. Antonette Batters office has been in communication about recommended next steps.   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   Non-bleeding when visualized on capsule endoscopy 06/30/2015    Arthritis    "lower back; hands" (02/19/2018)   Asthma    Asymptomatic cholelithiasis 09/25/2015   Seen on CT scan 08/2015   Carotid artery stenosis; s/p R endarterectomy    s/p right endarterectomy (06/2010) Carotid US  (07/2010):   Left: Moderate-to-severe (60-79%) calcific and non-calcific plaque origin and proximal ICA and ECA    Chronic congestive heart failure with left ventricular diastolic dysfunction (HCC) 10/21/2010   Chronic constipation 02/03/2011   Chronic daily headache 01/16/2014   Chronic iron  deficiency anemia    Chronic low back pain 10/06/2012   Chronic venous insufficiency 08/04/2012   Closed fracture of one rib of left side 08/23/2021   ED visit after a fall 08/16/21    COPD (chronic obstructive pulmonary disease) with emphysema (HCC)    PFTs 2018: severe obstructive disease, insignif response to bronchiodilator, mild restriction parenchymal pattern, moderately severe diffusion defect. 2014  FEV1 0.92 (40%), ratio 69, 27% increase in FEV1 with BD, TLC 91%, severe airtrapping, DLCO49% On chronic home O2. Pulmonary rehab referral 05/2012    Dyspnea    Fibromyalgia 08/29/2010   Gastroesophageal reflux disease    History of blood transfusion    "several times"  (02/19/2018)   History of clear cell renal cell carcinoma (HCC), in remission 07/21/2011   s/p cryoablation of left RCC in 09/2011 by Dr. Nereida Banning. Followed by Dr. Joie Narrow  [facility]) .     History of fracture of left hip 10/17/2022   History of hiatal hernia    History of mitral valve replacement with bioprosthetic valve due to mitral stenosis 2012   s/p MVR with a 27-mm pericardial porcine valve (Medtronic Mosaic valve, serial #[id number] on 09/20/10, Dr. Matt Song)    History of obstructive sleep apnea, resolved 2013   resolved per sleep study  07/2019; no apnea, but did have desaturation.  CPAP no longer necessary.  Nocturnal polysomnography (06/2009): Moderate sleep apnea/ hypopnea syndrome , AHI 17.8 per hour with nonpositional hypopneas. CPAP titration to 12 CWP, AHI 2.4 per hour. On nocturnal CPAP via a small resMed Quattro full-face mask with heated humidifier.    History of pneumonia    "once"  (02/19/2018)   History of seborrheic  keratosis 09/28/2015   History of stroke without residual deficits 01/17/2022   Hyperlipidemia LDL goal < 100 11/20/2005   Internal and external hemorrhoids without complication 08/04/2012   Lesion of left native kidney 06/01/2020   Incidental finding on recent screening chest CT for lung ca ordered by Dr. Linder Revere pulmonology - he has ordered a f/u renal U/S.    Lichen sclerosus of female genitalia 01/12/2017   Migraine    "none in years" (02/19/2018)   Mild cognitive impairment with memory loss 12/23/2021   Moderately severe major depression (HCC) 11/19/2005   Nocturnal hypoxia per sleep study 07/2019    Osteoporosis    DEXA 2016: T -2.7; DEXA (12/09/2011): L-spine T -3.7, left hip T -1.4 DEXA (12/2004): L-spine T -2.6, left hip -0.1    Paroxysmal atrial fibrillation (HCC) 10/22/2010   s/p Left atrial maze procedure for paroxysmal atrial fibrillation on 09/20/2010 by Dr Matt Song.  Subsequent splenic infarct, decision was made to re-anticoagulate with coumadin , likely life-long as this is the most likely cause of the splenic infarct.    Personal history of colonic polyps 05/14/2011   Colonoscopy (05/2011): 4 mm adenomatous polyp excised endoscopically Colonoscopy (02/2002): Adenomatous polyp excised endoscopically    Personal history of renal cell carcinoma 09/12/2020   Pneumonia    Pulmonary hypertension due to chronic obstructive pulmonary disease (HCC) 04/25/2016   2014 TEE w PA peak pressure 46 mmHg, s/p MV replacement    Right nephrolithiasis, asymptomatic, incidental finding 09/06/2014   5 mm non-obstructing calculus seen on CT scan 09/05/2014    Right ventricular failure (HCC) 04/25/2016   Severe obesity (BMI 35.0-39.9) with comorbidity (HCC) 10/23/2011   Sleep apnea    Stage 2 chronic kidney disease due to type 2 diabetes mellitus (HCC) 12/16/2018   CKD stage III     Tobacco abuse 07/28/2012   Type 2 diabetes mellitus with complication, with long-term current use of insulin  (HCC)     Type 2 diabetes mellitus with diabetic neuropathy (HCC)    Weight loss due to medication 03/12/2023    Medications:  Scheduled:   acetaminophen   1,000 mg Oral Once   acetaZOLAMIDE  500 mg Intravenous Daily   ALPRAZolam   0.5 mg Oral QHS   budesonide -glycopyrrolate -formoterol   2 puff Inhalation BID   buPROPion   150 mg Oral BH-q7a   carvedilol  12.5 mg Oral BID WC   furosemide   40 mg Intravenous BID   gabapentin   600 mg Oral QHS   insulin  aspart  0-15 Units Subcutaneous TID WC   insulin  glargine-yfgn  36 Units Subcutaneous Daily   linaclotide   72 mcg Oral QAC breakfast   losartan   50 mg Oral Daily   multivitamin with minerals  1 tablet Oral Daily   nicotine   14 mg Transdermal Daily   pantoprazole   40 mg Oral Daily   Ensure Max Protein  11 oz Oral BID   ramelteon   8 mg Oral QHS   spironolactone  50 mg Oral Daily   venlafaxine  XR  150 mg Oral Q breakfast   Warfarin - Pharmacist Dosing Inpatient   Does  not apply q1600    Assessment: INR at 0241 is 1.7 Goal of Therapy:  INR 2-3   Plan:  7.5 mg warfarin, PO at 1600 hours today. Continued surveillance of signs and symptoms of embolic events and or bleeding.   Kenda Paula, PharmD, CPP Clinical Pharmacist Practitioner 06/25/2023,7:02 AM

## 2023-06-25 NOTE — Plan of Care (Signed)
  Problem: Clinical Measurements: Goal: Diagnostic test results will improve Outcome: Progressing Goal: Respiratory complications will improve Outcome: Progressing   Problem: Coping: Goal: Level of anxiety will decrease Outcome: Progressing   Problem: Safety: Goal: Ability to remain free from injury will improve Outcome: Progressing   

## 2023-06-25 NOTE — Inpatient Diabetes Management (Signed)
 Inpatient Diabetes Program Recommendations  AACE/ADA: New Consensus Statement on Inpatient Glycemic Control (2015)  Target Ranges:  Prepandial:   less than 140 mg/dL      Peak postprandial:   less than 180 mg/dL (1-2 hours)      Critically ill patients:  140 - 180 mg/dL   Lab Results  Component Value Date   GLUCAP 412 (H) 06/25/2023   HGBA1C 5.9 (H) 06/20/2023    Review of Glycemic Control  Latest Reference Range & Units 06/23/23 20:56 06/24/23 06:19 06/24/23 11:11 06/24/23 15:58 06/24/23 21:04 06/25/23 06:14 06/25/23 12:08  Glucose-Capillary 70 - 99 mg/dL 474 (H) 259 (H) 563 (H) 263 (H) 195 (H) 157 (H) 412 (H)   Diabetes history: DM  Outpatient Diabetes medications:  Tresiba  30 units daily Current orders for Inpatient glycemic control:  Novolog  0-15 units tid with meals Semglee  36 units daily  Inpatient Diabetes Program Recommendations:    May consider adding Novolog  meal coverage 3 units tid with meals (hold if patient eats less than 50% or NPO).   Thanks,  Josefa Ni, RN, BC-ADM Inpatient Diabetes Coordinator Pager [phone]  (8a-5p)

## 2023-06-25 NOTE — TOC Progression Note (Signed)
 Transition of Care [facility]) - Progression Note    Patient Details  Name: [name] MRN: [id number] Date of Birth: 03/27/1950  Transition of [facility]) CM/SW Contact  Arron Big, [place] Phone Number: 06/25/2023, 12:23 PM  Clinical Narrative:   Insurance offering peer to peer for SNF and ambulance transport. Call Dr. Herbie Loll at [phone], due by 10 AM tomorrow.   12:23 PM CSW met patient at bedside and was given permission to call [name] (sis), but her phone went to VM. CSW spoke with patient at bedside and [name] (her daughter) on the phone regarding patients living situation. Patient lives alone and her sister, [name], is local to [place], [place]. [name] states she lives in [place], [place] and works in [place], [place]. [name] is patients older sister and assists with getting Ms. Wattenbarger to medical appointments and checks in on her. Family has had conversations with patient about moving forward with a different level of care - I.e. Assisted Living - and patient stated she wants to remain at her home. CSW informed family that personal home aids are available and an option for someone to go to patients house and sit with her, however personal home aids are private pay only. [name] expressed that is not financially feasible. [name] called CSW back and CSW reiterated conversation had with [name] and patient.   Case has been staffed with [facility] Supervisor, Aureliano Blow. TOC Supervisor and [employer] director, Philip Bravo, looking into [facility] community resources/programs patient may be eligible for.   TOC will continue to follow.    Expected Discharge Plan: Skilled Nursing Facility Barriers to Discharge: Continued Medical Work up, English as a second language teacher  Expected Discharge Plan and Services     Post Acute Care Choice: Skilled Nursing Facility Living arrangements for the past 2 months: Single Family Home                                       Social Determinants of Health (SDOH)  Interventions SDOH Screenings   Food Insecurity: No Food Insecurity (06/18/2023)  Recent Concern: Food Insecurity - Food Insecurity Present (05/25/2023)  Housing: Low Risk  (06/18/2023)  Transportation Needs: No Transportation Needs (06/18/2023)  Recent Concern: Transportation Needs - Unmet Transportation Needs (04/08/2023)  Utilities: Not At Risk (06/18/2023)  Alcohol Screen: Low Risk  (04/08/2023)  Depression (PHQ2-9): High Risk (04/08/2023)  Financial Resource Strain: Low Risk  (04/08/2023)  Physical Activity: Inactive (04/08/2023)  Social Connections: Socially Isolated (06/18/2023)  Stress: Stress Concern Present (04/08/2023)  Tobacco Use: High Risk (06/18/2023)  Health Literacy: Adequate Health Literacy (04/08/2023)    Readmission Risk Interventions     No data to display

## 2023-06-25 NOTE — Plan of Care (Signed)
   Problem: Education: Goal: Knowledge of General Education information will improve Description: Including pain rating scale, medication(s)/side effects and non-pharmacologic comfort measures Outcome: Progressing   Problem: Coping: Goal: Level of anxiety will decrease Outcome: Progressing   Problem: Safety: Goal: Ability to remain free from injury will improve Outcome: Progressing

## 2023-06-25 NOTE — Progress Notes (Signed)
   06/25/23 2036  BiPAP/CPAP/SIPAP  $ Non-Invasive Ventilator  Non-Invasive Vent Subsequent  BiPAP/CPAP/SIPAP Pt Type Adult  BiPAP/CPAP/SIPAP Resmed  Reason BIPAP/CPAP not in use Non-compliant (pt refused, resmed @ bedside if needed)  BiPAP/CPAP /SiPAP Vitals  Pulse Rate 72  Resp 19  SpO2 96 %  Bilateral Breath Sounds Diminished  MEWS Score/Color  MEWS Score 0  MEWS Score Color [name]

## 2023-06-25 NOTE — Progress Notes (Signed)
 Physical Therapy Treatment Patient Details Name: [name] MRN: [id number] DOB: Sep 03, 1950 Today's Date: 06/25/2023   History of Present Illness 73 y.o. female admitted 06/18/23 with LB edema, DOE and acute on chronic HFpEF. PMHx: COPD on O2, MVR, Rt CEA, PAF, CHF, HLD, GERD, HTN, GIB,  pulmonary hypertension/RV failure, T2DM, PAD, renal CA, tobacco use.    PT Comments  Pt seated up on EOB on arrival and agreeable to session, however pt limited by nausea this date, further decreasing pt activity tolerance. Pt able to come to stand with min A and RW support with continued cues for optimal hand placement. Pt able to take a few side steps along EOB to [facility] with RW support and CGA for safety. Pt unable to progress gait away from EOB this session. Patient will benefit from continued inpatient follow up therapy, <3 hours/day, will continue to follow acutely.     If plan is discharge home, recommend the following: A little help with walking and/or transfers;A little help with bathing/dressing/bathroom;Assist for transportation;Assistance with cooking/housework   Can travel by private vehicle     Yes  Equipment Recommendations  None recommended by PT    Recommendations for Other Services       Precautions / Restrictions Precautions Precautions: Fall;Other (comment) Recall of Precautions/Restrictions: Impaired Precaution/Restrictions Comments: watch SpO2 Restrictions Weight Bearing Restrictions Per Provider Order: No     Mobility  Bed Mobility Overal bed mobility: Modified Independent Bed Mobility: Sit to Supine       Sit to supine: Supervision   General bed mobility comments: supervision for safety    Transfers Overall transfer level: Needs assistance Equipment used: Rolling walker (2 wheels), None Transfers: Sit to/from Stand Sit to Stand: Min assist           General transfer comment: cues for hand placement and min A to boost to stand, pt bracing LEs at EOB,  able to take steps along EOB to [facility] with CGA    Ambulation/Gait               General Gait Details: pt declining gait this session due to nausea   Stairs             Wheelchair Mobility     Tilt Bed    Modified Rankin (Stroke Patients Only)       Balance Overall balance assessment: History of Falls, Needs assistance Sitting-balance support: No upper extremity supported, Feet supported Sitting balance-Leahy Scale: Good     Standing balance support: Single extremity supported, During functional activity Standing balance-Leahy Scale: Fair Standing balance comment: Rw in standing                            Communication Communication Communication: No apparent difficulties Factors Affecting Communication: Hearing impaired  Cognition Arousal: Alert Behavior During Therapy: WFL for tasks assessed/performed   PT - Cognitive impairments: No apparent impairments                         Following commands: Intact      Cueing Cueing Techniques: Verbal cues  Exercises      General Comments General comments (skin integrity, edema, etc.): VSSo on 2L      Pertinent Vitals/Pain Pain Assessment Pain Assessment: Faces Faces Pain Scale: Hurts a little bit Pain Location: generalized Pain Descriptors / Indicators: Guarding Pain Intervention(s): Monitored during session, Limited activity within patient's tolerance  Home Living                          Prior Function            PT Goals (current goals can now be found in the care plan section) Acute Rehab PT Goals PT Goal Formulation: With patient Time For Goal Achievement: 07/04/23 Progress towards PT goals: Not progressing toward goals - comment (nausea this session)    Frequency    Min 2X/week      PT Plan      Co-evaluation              AM-PAC PT "6 Clicks" Mobility   Outcome Measure  Help needed turning from your back to your side while in a flat  bed without using bedrails?: A Little Help needed moving from lying on your back to sitting on the side of a flat bed without using bedrails?: A Little Help needed moving to and from a bed to a chair (including a wheelchair)?: A Little Help needed standing up from a chair using your arms (e.g., wheelchair or bedside chair)?: A Little Help needed to walk in hospital room?: A Little Help needed climbing 3-5 steps with a railing? : A Lot 6 Click Score: 17    End of Session Equipment Utilized During Treatment: Oxygen  Activity Tolerance: Patient tolerated treatment well Patient left: with call bell/phone within reach;in bed Nurse Communication: Mobility status PT Visit Diagnosis: History of falling (Z91.81);Difficulty in walking, not elsewhere classified (R26.2);Muscle weakness (generalized) (M62.81);Other abnormalities of gait and mobility (R26.89)     Time: 1610-9604 PT Time Calculation (min) (ACUTE ONLY): 13 min  Charges:    $Therapeutic Activity: 8-22 mins PT General Charges $$ ACUTE PT VISIT: 1 Visit                     Timia Casselman R. PTA Acute Rehabilitation Services Office: [phone]   Agapito Horseman 06/25/2023, 5:01 PM

## 2023-06-25 NOTE — Progress Notes (Signed)
 Progress Note  Patient Name: [name] Date of Encounter: 06/25/2023  Primary Cardiologist: None   Subjective   Patient seen and examined at her bedside.  Inpatient Medications    Scheduled Meds:  acetaminophen   1,000 mg Oral Once   ALPRAZolam   0.5 mg Oral QHS   budesonide -glycopyrrolate -formoterol   2 puff Inhalation BID   buPROPion   150 mg Oral BH-q7a   carvedilol  12.5 mg Oral BID WC   furosemide   80 mg Oral Daily   gabapentin   600 mg Oral QHS   insulin  aspart  0-15 Units Subcutaneous TID WC   insulin  glargine-yfgn  36 Units Subcutaneous Daily   linaclotide   72 mcg Oral QAC breakfast   losartan   50 mg Oral Daily   multivitamin with minerals  1 tablet Oral Daily   nicotine   14 mg Transdermal Daily   pantoprazole   40 mg Oral Daily   Ensure Max Protein  11 oz Oral Daily   ramelteon   8 mg Oral QHS   spironolactone  50 mg Oral Daily   venlafaxine  XR  150 mg Oral Q breakfast   Warfarin - Pharmacist Dosing Inpatient   Does not apply q1600   Continuous Infusions:   PRN Meds: guaiFENesin , HYDROcodone -acetaminophen , ipratropium-albuterol , menthol -cetylpyridinium, ondansetron  (ZOFRAN ) IV, polyethylene glycol   Vital Signs    Vitals:   06/25/23 0607 06/25/23 0616 06/25/23 0800 06/25/23 1024  BP: 139/65  (!) 139/48 (!) 139/48  Pulse:   83 85  Resp: 20  16   Temp: 97.6 F (36.4 C)  97.7 F (36.5 C)   TempSrc: Oral  Oral   SpO2: 95%  98%   Weight:  59.1 kg    Height:        Intake/Output Summary (Last 24 hours) at 06/25/2023 1150 Last data filed at 06/25/2023 0900 Gross per 24 hour  Intake 597 ml  Output 1000 ml  Net -403 ml   Filed Weights   06/23/23 0500 06/24/23 0319 06/25/23 0616  Weight: 60.2 kg 60.1 kg 59.1 kg    Telemetry     - Personally Reviewed  ECG     - Personally Reviewed  Physical Exam     General: Comfortable, awake in bed - getting ready to walk with her nurse Head: Atraumatic, normal size  Eyes: PEERLA, EOMI  Neck:  Supple, normal JVD Cardiac: Normal S1, S2; RRR; no murmurs, rubs, or gallops Lungs: Clear to auscultation bilaterally Abd: Soft, nontender, no hepatomegaly  Ext: warm, +2-3 LE edema bilaterally , erythema  Musculoskeletal: No deformities, BUE and BLE strength normal and equal Skin: Warm and dry, no rashes   Neuro: Alert and oriented to person, place, time, and situation, CNII-XII grossly intact, no focal deficits  Psych: Normal mood and affect   Labs    Chemistry Recent Labs  Lab 06/23/23 0311 06/24/23 0244 06/25/23 0251  NA 140 138 136  K 4.6 5.0 4.9  CL 96* 94* 95*  CO2 37* 35* 31  GLUCOSE 196* 178* 183*  BUN 35* 41* 46*  CREATININE 1.13* 0.99 1.07*  CALCIUM  9.3 9.7 9.9  GFRNONAA 51* 60* 55*  ANIONGAP 7 9 10      Hematology Recent Labs  Lab 06/23/23 0311 06/24/23 0244 06/25/23 0251  WBC 7.6 10.2 15.8*  RBC 4.68 5.00 4.89  HGB 11.6* 12.4 12.0  HCT 40.7 43.8 42.6  MCV 87.0 87.6 87.1  MCH 24.8* 24.8* 24.5*  MCHC 28.5* 28.3* 28.2*  RDW 28.4* 28.3* 27.3*  PLT 261 308 293  Cardiac EnzymesNo results for input(s): "TROPONINI" in the last 168 hours. No results for input(s): "TROPIPOC" in the last 168 hours.   BNP Recent Labs  Lab 06/18/23 1223  BNP 993.8*     DDimer No results for input(s): "DDIMER" in the last 168 hours.   Radiology    No results found.  Cardiac Studies   Echo   Patient Profile     73 y.o. female  hx of oxygen  dependent COPD, mitral stenosis s/p bioprosthetic MV replacement, R carotid artery stenosis s/p endarterectomy 2012, paroxysmal atrial fibrillation, GI bleeding due to AVM, pulmonary hypertension/RV failure, type 2 DM, PAD, renal cell cancer s/p left kidney cryoablation, tobacco use, who is admitted for acute HFpEF.   Assessment & Plan    Acute on chronic HFpEF RV failure  Pulmonary HTN group 2 and 3  Mitral stenosis s/p bioprosthetic valve replacement 2012  PAF OSA Tobacco use  PAD  DM   Clinically improved - agree  with transition to po diuretics   Tolerated the addition of the Aldactone 50 mg daily - just to monitor the cr here   Blood  pressure improved - continue current medication regimen   She is in sinus rhythm today.   Note MV disease with Bioprosthesis not a surgical candidate.     For questions or updates, please contact [facility] Please consult [email or web address] for contact info under Cardiology/STEMI.      Signed, Shermaine Rivet, DO  06/25/2023, 11:50 AM

## 2023-06-25 NOTE — Progress Notes (Signed)
 Nutrition Follow-up  DOCUMENTATION CODES:   Non-severe (moderate) malnutrition in context of chronic illness  INTERVENTION:   Continue MVI with minerals daily. Continue Ensure Max PO, decrease to once daily given good intake of meals, each supplement provides 150 kcal and 30 grams of protein.   NUTRITION DIAGNOSIS:   Moderate Malnutrition related to chronic illness (CHF, COPD) as evidenced by mild fat depletion, mild muscle depletion.  Ongoing   GOAL:   Patient will meet greater than or equal to 90% of their needs  Met   MONITOR:   PO intake, Supplement acceptance, I & O's, Labs  REASON FOR ASSESSMENT:   Consult Diet education  ASSESSMENT:   73 y.o. female presented to the ED with SOB and leg swelling. PMH includes COPD, HLD, GERD, T2DM, HTN, and CHF. Pt admitted with CHF exacerbation.  Currently on a 2 gm sodium diet with 1500 ml fluid restriction. Intake is good with overall meal completions 75-100%. She is drinking Ensure Max BID.   Labs reviewed.  CBG: 195-157  Medications reviewed and include lasix , novolog , semglee , MVI with minerals, spironolactone, warfarin.  Admit weight 63.5 kg Current weight 59.1 kg  Weight loss related to negative fluid balance: I/O -7 L since admission UOP 2750 ml x 24 hours  Diet Order:   Diet Order             Diet 2 gram sodium Room service appropriate? Yes with Assist; Fluid consistency: Thin; Fluid restriction: 1500 mL Fluid  Diet effective now                   EDUCATION NEEDS:   No education needs have been identified at this time  Skin:  Skin Assessment: Reviewed RN Assessment  Last BM:  5/7  Height:   Ht Readings from Last 1 Encounters:  06/18/23 5\' 2"  (1.575 m)    Weight:   Wt Readings from Last 1 Encounters:  06/25/23 59.1 kg    Ideal Body Weight:  50 kg  BMI:  Body mass index is 23.83 kg/m.  Estimated Nutritional Needs:   Kcal:  1700-1900  Protein:  80-100 grams  Fluid:  >/= 1.7  L   Barnet Boots RD, LDN, CNSC Contact via secure chat. If unavailable, use group chat "RD Inpatient."

## 2023-06-26 ENCOUNTER — Ambulatory Visit: Admitting: Podiatry

## 2023-06-26 DIAGNOSIS — I480 Paroxysmal atrial fibrillation: Secondary | ICD-10-CM | POA: Diagnosis not present

## 2023-06-26 DIAGNOSIS — I5033 Acute on chronic diastolic (congestive) heart failure: Secondary | ICD-10-CM | POA: Diagnosis not present

## 2023-06-26 DIAGNOSIS — I272 Pulmonary hypertension, unspecified: Secondary | ICD-10-CM | POA: Diagnosis not present

## 2023-06-26 DIAGNOSIS — I110 Hypertensive heart disease with heart failure: Secondary | ICD-10-CM | POA: Diagnosis not present

## 2023-06-26 LAB — GLUCOSE, CAPILLARY
Glucose-Capillary: 146 mg/dL — ABNORMAL HIGH (ref 70–99)
Glucose-Capillary: 262 mg/dL — ABNORMAL HIGH (ref 70–99)
Glucose-Capillary: 194 mg/dL — ABNORMAL HIGH (ref 70–99)
Glucose-Capillary: 249 mg/dL — ABNORMAL HIGH (ref 70–99)
Glucose-Capillary: 200 mg/dL — ABNORMAL HIGH (ref 70–99)

## 2023-06-26 LAB — CBC
HCT: 44 % (ref 36.0–46.0)
Hemoglobin: 12.8 g/dL (ref 12.0–15.0)
MCH: 25.3 pg — ABNORMAL LOW (ref 26.0–34.0)
MCHC: 29.1 g/dL — ABNORMAL LOW (ref 30.0–36.0)
MCV: 87.1 fL (ref 80.0–100.0)
Platelets: 285 10*3/uL (ref 150–400)
RBC: 5.05 MIL/uL (ref 3.87–5.11)
RDW: 26.9 % — ABNORMAL HIGH (ref 11.5–15.5)
WBC: 8.8 10*3/uL (ref 4.0–10.5)
nRBC: 0 % (ref 0.0–0.2)

## 2023-06-26 LAB — BASIC METABOLIC PANEL WITH GFR
Anion gap: 5 (ref 5–15)
BUN: 37 mg/dL — ABNORMAL HIGH (ref 8–23)
CO2: 34 mmol/L — ABNORMAL HIGH (ref 22–32)
Calcium: 9.6 mg/dL (ref 8.9–10.3)
Chloride: 99 mmol/L (ref 98–111)
Creatinine, Ser: 1.03 mg/dL — ABNORMAL HIGH (ref 0.44–1.00)
GFR, Estimated: 57 mL/min — ABNORMAL LOW (ref >60)
Glucose, Bld: 136 mg/dL — ABNORMAL HIGH (ref 70–99)
Potassium: 4.1 mmol/L (ref 3.5–5.1)
Sodium: 138 mmol/L (ref 135–145)

## 2023-06-26 LAB — PROTIME-INR
INR: 1.9 — ABNORMAL HIGH (ref 0.8–1.2)
Prothrombin Time: 21.9 s — ABNORMAL HIGH (ref 11.4–15.2)

## 2023-06-26 LAB — MAGNESIUM: Magnesium: 2.5 mg/dL — ABNORMAL HIGH (ref 1.7–2.4)

## 2023-06-26 MED ORDER — SACUBITRIL-VALSARTAN 24-26 MG PO TABS
1.0000 | ORAL_TABLET | Freq: Two times a day (BID) | ORAL | Status: DC
  Administered 2023-06-26 – 2023-07-01 (×11): 1 via ORAL
  Filled 2023-06-26 (×11): qty 1

## 2023-06-26 MED ORDER — WARFARIN SODIUM 5 MG PO TABS
5.0000 mg | ORAL_TABLET | ORAL | Status: AC
  Administered 2023-06-26: 5 mg via ORAL
  Filled 2023-06-26: qty 1

## 2023-06-26 MED ORDER — INSULIN ASPART 100 UNIT/ML IJ SOLN
3.0000 [IU] | Freq: Three times a day (TID) | INTRAMUSCULAR | Status: DC
  Administered 2023-06-26 – 2023-06-28 (×7): 3 [IU] via SUBCUTANEOUS

## 2023-06-26 NOTE — Progress Notes (Signed)
 PHARMACY - ANTICOAGULATION CONSULT NOTE  Pharmacy Consult for warfarin dosing. Indication: atrial fibrillation  Allergies  Allergen Reactions   Dexilant [Dexlansoprazole] Other (See Comments)    Made acid reflux worse   Metoclopramide      Other Reaction(s): unknown   Isovue  [Iopamidol ] Itching and Rash   Lorazepam Other (See Comments)    Patient's sister noted that ativan caused the patient to become extremely confused during hospitalization 09/2010; tolerates Xanax    Oxycontin  [Oxycodone ] Other (See Comments)    headache   Tramadol Hcl Swelling    Ankle swelling    Patient Measurements: Height: 5\' 2"  (157.5 cm) Weight: 57.6 kg (126 lb 15.8 oz) IBW/kg (Calculated) : 50.1 HEPARIN  DW (KG): 63.3  Vital Signs: Temp: 97.5 F (36.4 C) (05/16 0500) Temp Source: Oral (05/16 0500) BP: 159/51 (05/16 0500) Pulse Rate: 65 (05/16 0500)  Labs: Recent Labs    06/24/23 0244 06/25/23 0251 06/26/23 0526  HGB 12.4 12.0 12.8  HCT 43.8 42.6 44.0  PLT 308 293 285  LABPROT 17.9* 20.1* 21.9*  INR 1.5* 1.7* 1.9*  CREATININE 0.99 1.07* 1.03*    Estimated Creatinine Clearance: 37.9 mL/min (A) (by C-G formula based on SCr of 1.03 mg/dL (H)).   Medical History: Past Medical History:  Diagnosis Date   Abnormality of lung on CXR 02/02/2020   Nonspecific finding on CXR ordered by pulmonologist - c/w inflammation vs infection, f/u imaging suggested, Dr. Antonette Batters office has been in communication about recommended next steps.   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   Non-bleeding when visualized on capsule endoscopy 06/30/2015    Arthritis    "lower back; hands" (02/19/2018)   Asthma    Asymptomatic cholelithiasis 09/25/2015   Seen on CT scan 08/2015   Carotid artery stenosis; s/p R endarterectomy    s/p right endarterectomy (06/2010) Carotid US  (07/2010):   Left: Moderate-to-severe (60-79%) calcific and non-calcific plaque origin and proximal ICA and ECA    Chronic congestive heart failure with left ventricular diastolic dysfunction (HCC) 10/21/2010   Chronic constipation 02/03/2011   Chronic daily headache 01/16/2014   Chronic iron  deficiency anemia    Chronic low back pain 10/06/2012   Chronic venous insufficiency 08/04/2012   Closed fracture of one rib of left side 08/23/2021   ED visit after a fall 08/16/21    COPD (chronic obstructive pulmonary disease) with emphysema (HCC)    PFTs 2018: severe obstructive disease, insignif response to bronchiodilator, mild restriction parenchymal pattern, moderately severe diffusion defect. 2014  FEV1 0.92 (40%), ratio 69, 27% increase in FEV1 with BD, TLC 91%, severe airtrapping, DLCO49% On chronic home O2. Pulmonary rehab referral 05/2012    Dyspnea    Fibromyalgia 08/29/2010   Gastroesophageal reflux disease    History of blood transfusion    "several times"  (02/19/2018)   History of clear cell renal cell carcinoma (HCC), in remission 07/21/2011   s/p cryoablation of left RCC in 09/2011 by Dr. Nereida Banning. Followed by Dr. Joie Narrow  [facility]) .     History of fracture of left hip 10/17/2022   History of hiatal hernia    History of mitral valve replacement with bioprosthetic valve due to mitral stenosis 2012   s/p MVR with a 27-mm pericardial porcine valve (Medtronic Mosaic valve, serial #[id number] on 09/20/10, Dr. Matt Song)    History of obstructive sleep apnea, resolved 2013   resolved per sleep study  07/2019; no apnea, but did have desaturation.  CPAP no longer necessary.  Nocturnal polysomnography (06/2009): Moderate sleep apnea/ hypopnea syndrome , AHI 17.8 per hour with nonpositional hypopneas. CPAP titration to 12 CWP, AHI 2.4 per hour. On nocturnal CPAP via a small resMed Quattro full-face mask with heated humidifier.    History of pneumonia    "once"  (02/19/2018)   History of seborrheic  keratosis 09/28/2015   History of stroke without residual deficits 01/17/2022   Hyperlipidemia LDL goal < 100 11/20/2005   Internal and external hemorrhoids without complication 08/04/2012   Lesion of left native kidney 06/01/2020   Incidental finding on recent screening chest CT for lung ca ordered by Dr. Linder Revere pulmonology - he has ordered a f/u renal U/S.    Lichen sclerosus of female genitalia 01/12/2017   Migraine    "none in years" (02/19/2018)   Mild cognitive impairment with memory loss 12/23/2021   Moderately severe major depression (HCC) 11/19/2005   Nocturnal hypoxia per sleep study 07/2019    Osteoporosis    DEXA 2016: T -2.7; DEXA (12/09/2011): L-spine T -3.7, left hip T -1.4 DEXA (12/2004): L-spine T -2.6, left hip -0.1    Paroxysmal atrial fibrillation (HCC) 10/22/2010   s/p Left atrial maze procedure for paroxysmal atrial fibrillation on 09/20/2010 by Dr Matt Song.  Subsequent splenic infarct, decision was made to re-anticoagulate with coumadin , likely life-long as this is the most likely cause of the splenic infarct.    Personal history of colonic polyps 05/14/2011   Colonoscopy (05/2011): 4 mm adenomatous polyp excised endoscopically Colonoscopy (02/2002): Adenomatous polyp excised endoscopically    Personal history of renal cell carcinoma 09/12/2020   Pneumonia    Pulmonary hypertension due to chronic obstructive pulmonary disease (HCC) 04/25/2016   2014 TEE w PA peak pressure 46 mmHg, s/p MV replacement    Right nephrolithiasis, asymptomatic, incidental finding 09/06/2014   5 mm non-obstructing calculus seen on CT scan 09/05/2014    Right ventricular failure (HCC) 04/25/2016   Severe obesity (BMI 35.0-39.9) with comorbidity (HCC) 10/23/2011   Sleep apnea    Stage 2 chronic kidney disease due to type 2 diabetes mellitus (HCC) 12/16/2018   CKD stage III     Tobacco abuse 07/28/2012   Type 2 diabetes mellitus with complication, with long-term current use of insulin  (HCC)     Type 2 diabetes mellitus with diabetic neuropathy (HCC)    Weight loss due to medication 03/12/2023    Medications:  Scheduled:   acetaminophen   1,000 mg Oral Once   ALPRAZolam   0.5 mg Oral QHS   budesonide -glycopyrrolate -formoterol   2 puff Inhalation BID   buPROPion   150 mg Oral BH-q7a   carvedilol  12.5 mg Oral BID WC   furosemide   80 mg Oral Daily   gabapentin   600 mg Oral QHS   insulin  aspart  0-15 Units Subcutaneous TID WC   insulin  aspart  3 Units Subcutaneous TID WC   insulin  glargine-yfgn  36 Units Subcutaneous Daily   linaclotide   72 mcg Oral QAC breakfast   losartan   50 mg Oral Daily   multivitamin with minerals  1 tablet Oral Daily   nicotine   14 mg Transdermal Daily   pantoprazole   40 mg Oral Daily   Ensure Max Protein  11 oz Oral Daily   ramelteon   8 mg Oral QHS   spironolactone  50 mg Oral Daily   venlafaxine  XR  150 mg Oral Q breakfast   Warfarin - Pharmacist Dosing Inpatient  Does not apply q1600    Assessment: INR 1.9 at 0526 today. Readying for discharge to a SNF. Suspect that over the weekend (based upon half-life's of the Vitamin K Dependent Clotting Factors, VII, IX, X and II) that her INR may have a sudden upwards bump.  Goal of Therapy:  INR target range as per patient's PCP, Dr. Concha Deed A. Broadus Canes has been set at 2.0 - 2.5.   Plan:  Today's dose, 5 mg warfarin PO. Would suggest this be the warfarin dose provided in discharge orders was well with instructions to SNF to evaluate INR response over the weekend (Sat/Sun) for their provider to react to and adjust dose as necessary. Discussed with Dr. Herminio Lopes who feels the [facility] Protein shakes may not be necessary/continued in the SNF. These provide 20% of the recommended daily requirement of vitamin K1 . As such, when these are discontinued, her dosing requirement of warfarin may decline.   Kenda Paula, PharmD, CPP Clinical Pharmacist Practitioner 06/26/2023,7:10 AM

## 2023-06-26 NOTE — Progress Notes (Addendum)
 Subjective No complaints/concerns.  Physical exam Blood pressure (!) 155/48, pulse 66, temperature 98.3 F (36.8 C), temperature source Oral, resp. rate 17, height 5\' 2"  (1.575 m), weight 57.6 kg, SpO2 100%.  Constitutional: sitting up in bed, in no acute distress Cardiovascular: regular rate and rhythm, no m/r/g, bilateral LEE  Pulmonary/Chest: normal work of breathing on 2 L [place], coarse Neurological: alert & oriented   Weight change: -1.5 kg   Intake/Output Summary (Last 24 hours) at 06/26/2023 0900 Last data filed at 06/26/2023 0535 Gross per 24 hour  Intake 840 ml  Output 900 ml  Net -60 ml   Net IO Since Admission: -7,128.36 mL [06/26/23 0900]  Labs, images, and other studies    Latest Ref Rng & Units 06/26/2023    5:26 AM 06/25/2023    2:51 AM 06/24/2023    2:44 AM  CBC  WBC 4.0 - 10.5 K/uL 8.8  15.8  10.2   Hemoglobin 12.0 - 15.0 g/dL 16.1  09.6  04.5   Hematocrit 36.0 - 46.0 % 44.0  42.6  43.8   Platelets 150 - 400 K/uL 285  293  308        Latest Ref Rng & Units 06/26/2023    5:26 AM 06/25/2023    1:14 PM 06/25/2023    2:51 AM  BMP  Glucose 70 - 99 mg/dL 409  811  914   BUN 8 - 23 mg/dL 37   46   Creatinine 7.82 - 1.00 mg/dL 9.56   2.13   Sodium 086 - 145 mmol/L 138   136   Potassium 3.5 - 5.1 mmol/L 4.1   4.9   Chloride 98 - 111 mmol/L 99   95   CO2 22 - 32 mmol/L 34   31   Calcium  8.9 - 10.3 mg/dL 9.6   9.9      Assessment and plan Hospital day 8  Avelyn Arminta Volk is a 73 y.o.female pertinent PMH of R carotid artery stenosis s/p endarterectomy, s/p MVR, HFpEF, pAF on warfarin, tobacco use disorder, T2DM, and COPD/emphysema on 2 L [place] at bedtime, who [facility] with bilateral lower extremity edema and admitted for HFpEF exacerbation.    Acute on chronic HFpEF Pulmonary hypertension Cardiology following, appreciate recommendations. ~ Euvolemic on exam. Vitals/labs look good. -Continue Lasix  po 80 mg/daily. -Cardiology  d/c Losartan  and starting Entresto 24-26 BID , Coreg 12.5 BID, and Aldactone 50. Cardiology considering. Recommend starting Jardiance  outpatient after repeat labs. - Strict I&Os, daily weights - Daily BMP, Mg   Paroxysmal afib S/P mitral valve repair Hypertension Meds per above. Warfarin per pharmacy for MV stenosis s/p bioprothetic valve (2012). INR remains subtherapeutic. BP looks better after starting Coreg. 5/13   PAD ABI's from 05/2023 show R>L PAD. LE exam consistent with ischemic rubor though this seems to be improving. LDL is 91. Not a candidate for ASA while on Warfarin. Will continue to encourage smoking cessation as this appears to be only modifiable risk factor presently.    COPD Tobacco Use Disorder On Breztri  here (Trelegy at home) and DuoNebs prn. - Maintain SpO2 between 88-92% - Incentive spirometry  - Consider pulmonary rehab outpatient - Nicotine  patches and continued counseling on smoking cessation   T2DM A1c on admission is 5.9. CBG's within goal. -Semglee  to 36 units daily + SSI   Depression - Continue venlafaxine , wellbutrin , ramelteon , and xanax   Iron  deficiency anemia Stable at 12.8 today. Has been receiving IV iron  as an outpatient with last infusion on 4/17.    Chronic pain Neuralgia -Continue home Norco 5-325 two tablets q6h PRN and gabapentin  600 mg at bedtime.    Constipation - Continue linzess  - Miralax  daily PRN   Disposition TOC following for SNF placement which has been approved, bed pending.  Ronni Colace, DO 06/26/2023, 9:00 AM  Pager: [phone] After 5pm or weekend: [phone]

## 2023-06-26 NOTE — Progress Notes (Addendum)
 Mobility Specialist: Progress Note   06/26/23 1308  Mobility  Activity Ambulated with assistance to bathroom  Level of Assistance Standby assist, set-up cues, supervision of patient - no hands on  Assistive Device Front wheel walker  Distance Ambulated (ft) 15 ft  Activity Response Tolerated well  Mobility Referral Yes  Mobility visit 1 Mobility  Mobility Specialist Start Time (ACUTE ONLY) 1230  Mobility Specialist Stop Time (ACUTE ONLY) 1250  Mobility Specialist Time Calculation (min) (ACUTE ONLY) 20 min    Pt requested to use BR - received EOB. SV throughout. SpO2 90% RA on EOB. Ambulated to the BR without fault. Initially agreeable to try further ambulation after, but did not feel well on the commode and wanted to sit there for awhile. Left in BR with all needs met, call bell in reach. RN aware.  Deloria Fetch Mobility Specialist Please contact via SecureChat or Rehab office at [phone]

## 2023-06-26 NOTE — Progress Notes (Signed)
 Progress Note  Patient Name: [name] Date of Encounter: 06/26/2023  Primary Cardiologist: None   Subjective   Patient seen and examined at her bedside.  Inpatient Medications    Scheduled Meds:  acetaminophen   1,000 mg Oral Once   ALPRAZolam   0.5 mg Oral QHS   budesonide -glycopyrrolate -formoterol   2 puff Inhalation BID   buPROPion   150 mg Oral BH-q7a   carvedilol  12.5 mg Oral BID WC   furosemide   80 mg Oral Daily   gabapentin   600 mg Oral QHS   insulin  aspart  0-15 Units Subcutaneous TID WC   insulin  aspart  3 Units Subcutaneous TID WC   insulin  glargine-yfgn  36 Units Subcutaneous Daily   linaclotide   72 mcg Oral QAC breakfast   losartan   50 mg Oral Daily   multivitamin with minerals  1 tablet Oral Daily   nicotine   14 mg Transdermal Daily   pantoprazole   40 mg Oral Daily   Ensure Max Protein  11 oz Oral Daily   ramelteon   8 mg Oral QHS   spironolactone  50 mg Oral Daily   venlafaxine  XR  150 mg Oral Q breakfast   Warfarin - Pharmacist Dosing Inpatient   Does not apply q1600   Continuous Infusions:   PRN Meds: guaiFENesin , HYDROcodone -acetaminophen , ipratropium-albuterol , menthol -cetylpyridinium, ondansetron  (ZOFRAN ) IV, polyethylene glycol   Vital Signs    Vitals:   06/25/23 2036 06/26/23 0148 06/26/23 0500 06/26/23 0737  BP:  (!) 153/45 (!) 159/51 (!) 155/48  Pulse: 72 66 65 66  Resp: 19 19 19 17   Temp:  97.7 F (36.5 C) (!) 97.5 F (36.4 C) 98.3 F (36.8 C)  TempSrc:  Oral Oral Oral  SpO2: 96% 97% 99% 100%  Weight:   57.6 kg   Height:        Intake/Output Summary (Last 24 hours) at 06/26/2023 0935 Last data filed at 06/26/2023 0535 Gross per 24 hour  Intake 840 ml  Output 900 ml  Net -60 ml   Filed Weights   06/24/23 0319 06/25/23 0616 06/26/23 0500  Weight: 60.1 kg 59.1 kg 57.6 kg    Telemetry     - Personally Reviewed  ECG     - Personally Reviewed  Physical Exam     General: Comfortable, awake in bed - getting  ready to walk with her nurse Head: Atraumatic, normal size  Eyes: PEERLA, EOMI  Neck: Supple, normal JVD Cardiac: Normal S1, S2; RRR; no murmurs, rubs, or gallops Lungs: Clear to auscultation bilaterally Abd: Soft, nontender, no hepatomegaly  Ext: warm, +2-3 LE edema bilaterally , erythema  Musculoskeletal: No deformities, BUE and BLE strength normal and equal Skin: Warm and dry, no rashes   Neuro: Alert and oriented to person, place, time, and situation, CNII-XII grossly intact, no focal deficits  Psych: Normal mood and affect   Labs    Chemistry Recent Labs  Lab 06/24/23 0244 06/25/23 0251 06/25/23 1314 06/26/23 0526  NA 138 136  --  138  K 5.0 4.9  --  4.1  CL 94* 95*  --  99  CO2 35* 31  --  34*  GLUCOSE 178* 183* 326* 136*  BUN 41* 46*  --  37*  CREATININE 0.99 1.07*  --  1.03*  CALCIUM  9.7 9.9  --  9.6  GFRNONAA 60* 55*  --  57*  ANIONGAP 9 10  --  5     Hematology Recent Labs  Lab 06/24/23 0244 06/25/23 0251 06/26/23 1610  WBC 10.2 15.8* 8.8  RBC 5.00 4.89 5.05  HGB 12.4 12.0 12.8  HCT 43.8 42.6 44.0  MCV 87.6 87.1 87.1  MCH 24.8* 24.5* 25.3*  MCHC 28.3* 28.2* 29.1*  RDW 28.3* 27.3* 26.9*  PLT 308 293 285    Cardiac EnzymesNo results for input(s): "TROPONINI" in the last 168 hours. No results for input(s): "TROPIPOC" in the last 168 hours.   BNP No results for input(s): "BNP", "PROBNP" in the last 168 hours.    DDimer No results for input(s): "DDIMER" in the last 168 hours.   Radiology    No results found.  Cardiac Studies   Echo   Patient Profile     73 y.o. female  hx of oxygen  dependent COPD, mitral stenosis s/p bioprosthetic MV replacement, R carotid artery stenosis s/p endarterectomy 2012, paroxysmal atrial fibrillation, GI bleeding due to AVM, pulmonary hypertension/RV failure, type 2 DM, PAD, renal cell cancer s/p left kidney cryoablation, tobacco use, who is admitted for acute HFpEF.   Assessment & Plan    Acute on chronic  HFpEF RV failure  Pulmonary HTN group 2 and 3  Mitral stenosis s/p bioprosthetic valve replacement 2012  PAF OSA Tobacco use  PAD  DM   Clinically improved - agree with transition to po diuretics, I would recommend discharging on Lasix  40 mg daily.    Tolerated the addition of the Aldactone 50 mg daily, I will stop losartan  and start Entresto. Would recommend farxiga be added in the out patient after repeat labs for cr. Cont current dose of coreg  Blood  pressure - elevated today, hoping adjust made today will help  She is in sinus rhythm today.   Note MV disease with Bioprosthesis not a surgical candidate.   Medication recommendations as above for discharge - we will sign off at this time. We will set an appt for the patient.    For questions or updates, please contact [facility] Please consult [email or web address] for contact info under Cardiology/STEMI.      Signed, Eniya Cannady, DO  06/26/2023, 9:35 AM

## 2023-06-26 NOTE — Progress Notes (Addendum)
 Evaluated patient at bedside as RN noted bradycardia in 40s to 50s with nausea. BP is stable and she is saturating well on home O2. No chest pain. EKG ordered which showed prolonged PR interval.  Will hold Coreg tonight and re-evaluate in AM. QTc  is 453 so will give Zofran  for nausea.

## 2023-06-26 NOTE — Progress Notes (Signed)
 [facility] Hospital Liaison Note  06/26/2023  [name] 1950/08/24 [id number]  Location: RN Hospital Liaison screened the patient remotely at [facility].  Insurance: [employer]   [name] is a 73 y.o. female who is a Primary Care Patient of Broadus Canes, Whitney Hams, MD [facility]. The patient was screened for readmission hospitalization with noted extreme risk score for unplanned readmission risk with 2 IP/1 ED in 6 months.  The patient was assessed for potential Care Management service needs for post hospital transition for care coordination. Review of patient's electronic medical record reveals patient was admitted with Acute on Chronic heart failure with preserved ejection fraction. Liaison spoke with the live in sister [name] who indicated she recent spoke with the pt's daughter who verified pt's discharge disposition was to transition to [facility] possibly tomorrow. This facility will continue to address pt's ongoing needs post hospital discharge. No VBCI needs to address at this time.    VBCI Care Management/Population Health does not replace or interfere with any arrangements made by the Inpatient Transition of Care team.   For questions contact:   Lilla Reichert, RN, BSN Hospital Liaison [place]   [facility], Population Health Office Hours MTWF  8:00 am-6:00 pm Direct Dial: [phone] mobile [email or web address]

## 2023-06-26 NOTE — TOC Progression Note (Addendum)
 Transition of Care [facility]) - Progression Note    Patient Details  Name: [name] MRN: [id number] Date of Birth: 1951/02/10  Transition of [facility]) CM/SW Contact  Arron Big, [place] Phone Number: 06/26/2023, 10:58 AM  Clinical Narrative:   HTA called CSW and stated insurance [name] has been approved - id [id number]. [name] is approved for 7 days. PTAR has been denied.   CSW reached out to [facility] about bed availability today. Per admissions, they will let CSW know about bed availability today or tomorrow as they are planning on some potential discharges.   CSW called [name] (pts sis) to discuss approval of SNF and transportation. [name] stated her husband can pick patient up but it would be later in the day. CSW informed [name] that we're waiting on bed availability at [facility] now.   Treatment team has been updated/notified.   2:35 PM Facility cannot accept patient today. CSW will follow up on bed availability.    TOC will continue to follow.    Expected Discharge Plan: Skilled Nursing Facility Barriers to Discharge: Continued Medical Work up, English as a second language teacher  Expected Discharge Plan and Services     Post Acute Care Choice: Skilled Nursing Facility Living arrangements for the past 2 months: Single Family Home                                       Social Determinants of Health (SDOH) Interventions SDOH Screenings   Food Insecurity: No Food Insecurity (06/18/2023)  Recent Concern: Food Insecurity - Food Insecurity Present (05/25/2023)  Housing: Low Risk  (06/18/2023)  Transportation Needs: No Transportation Needs (06/18/2023)  Recent Concern: Transportation Needs - Unmet Transportation Needs (04/08/2023)  Utilities: Not At Risk (06/18/2023)  Alcohol Screen: Low Risk  (04/08/2023)  Depression (PHQ2-9): High Risk (04/08/2023)  Financial Resource Strain: Low Risk  (04/08/2023)  Physical Activity: Inactive (04/08/2023)  Social Connections: Socially Isolated  (06/18/2023)  Stress: Stress Concern Present (04/08/2023)  Tobacco Use: High Risk (06/18/2023)  Health Literacy: Adequate Health Literacy (04/08/2023)    Readmission Risk Interventions     No data to display

## 2023-06-27 DIAGNOSIS — I272 Pulmonary hypertension, unspecified: Secondary | ICD-10-CM | POA: Diagnosis not present

## 2023-06-27 DIAGNOSIS — I480 Paroxysmal atrial fibrillation: Secondary | ICD-10-CM | POA: Diagnosis not present

## 2023-06-27 DIAGNOSIS — I110 Hypertensive heart disease with heart failure: Secondary | ICD-10-CM | POA: Diagnosis not present

## 2023-06-27 DIAGNOSIS — I5033 Acute on chronic diastolic (congestive) heart failure: Secondary | ICD-10-CM | POA: Diagnosis not present

## 2023-06-27 LAB — CBC
HCT: 39.7 % (ref 36.0–46.0)
Hemoglobin: 11.9 g/dL — ABNORMAL LOW (ref 12.0–15.0)
MCH: 25.3 pg — ABNORMAL LOW (ref 26.0–34.0)
MCHC: 30 g/dL (ref 30.0–36.0)
MCV: 84.5 fL (ref 80.0–100.0)
Platelets: 274 10*3/uL (ref 150–400)
RBC: 4.7 MIL/uL (ref 3.87–5.11)
RDW: 26.3 % — ABNORMAL HIGH (ref 11.5–15.5)
WBC: 9.2 10*3/uL (ref 4.0–10.5)
nRBC: 0 % (ref 0.0–0.2)

## 2023-06-27 LAB — BASIC METABOLIC PANEL WITH GFR
Anion gap: 8 (ref 5–15)
BUN: 51 mg/dL — ABNORMAL HIGH (ref 8–23)
CO2: 25 mmol/L (ref 22–32)
Calcium: 9.3 mg/dL (ref 8.9–10.3)
Chloride: 102 mmol/L (ref 98–111)
Creatinine, Ser: 0.97 mg/dL (ref 0.44–1.00)
GFR, Estimated: >60 mL/min (ref >60)
Glucose, Bld: 187 mg/dL — ABNORMAL HIGH (ref 70–99)
Potassium: 4.2 mmol/L (ref 3.5–5.1)
Sodium: 135 mmol/L (ref 135–145)

## 2023-06-27 LAB — GLUCOSE, CAPILLARY
Glucose-Capillary: 248 mg/dL — ABNORMAL HIGH (ref 70–99)
Glucose-Capillary: 276 mg/dL — ABNORMAL HIGH (ref 70–99)
Glucose-Capillary: 184 mg/dL — ABNORMAL HIGH (ref 70–99)
Glucose-Capillary: 268 mg/dL — ABNORMAL HIGH (ref 70–99)

## 2023-06-27 LAB — MAGNESIUM: Magnesium: 2.4 mg/dL (ref 1.7–2.4)

## 2023-06-27 LAB — PROTIME-INR
INR: 2 — ABNORMAL HIGH (ref 0.8–1.2)
Prothrombin Time: 22.7 s — ABNORMAL HIGH (ref 11.4–15.2)

## 2023-06-27 MED ORDER — FUROSEMIDE 40 MG PO TABS: 40.0000 mg | ORAL_TABLET | Freq: Every day | ORAL | Status: DC

## 2023-06-27 MED ORDER — CARVEDILOL 6.25 MG PO TABS
6.2500 mg | ORAL_TABLET | Freq: Two times a day (BID) | ORAL | Status: DC
  Administered 2023-06-27 – 2023-07-01 (×8): 6.25 mg via ORAL
  Filled 2023-06-27 (×9): qty 1

## 2023-06-27 MED ORDER — WARFARIN SODIUM 5 MG PO TABS
5.0000 mg | ORAL_TABLET | Freq: Once | ORAL | Status: AC
  Administered 2023-06-27: 5 mg via ORAL
  Filled 2023-06-27: qty 1

## 2023-06-27 NOTE — Progress Notes (Signed)
 Subjective Denies lightheadedness, chest pain, SOB.   Physical exam Blood pressure (!) 124/48, pulse 81, temperature 97.9 F (36.6 C), temperature source Oral, resp. rate 16, height 5\' 2"  (1.575 m), weight 57.9 kg, SpO2 95%.  Constitutional: sitting up in bed, in no acute distress Cardiovascular: regular rate and rhythm, no m/r/g Pulmonary/Chest: normal work of breathing on 2 L [place], coarse Neurological: alert & oriented  Weight change: 0.279 kg   Intake/Output Summary (Last 24 hours) at 06/27/2023 1017 Last data filed at 06/27/2023 0942 Gross per 24 hour  Intake 480.8 ml  Output 850 ml  Net -369.2 ml   Net IO Since Admission: -7,497.56 mL [06/27/23 1017]  Labs, images, and other studies    Latest Ref Rng & Units 06/27/2023    2:27 AM 06/26/2023    5:26 AM 06/25/2023    2:51 AM  CBC  WBC 4.0 - 10.5 K/uL 9.2  8.8  15.8   Hemoglobin 12.0 - 15.0 g/dL 65.7  84.6  96.2   Hematocrit 36.0 - 46.0 % 39.7  44.0  42.6   Platelets 150 - 400 K/uL 274  285  293        Latest Ref Rng & Units 06/27/2023    2:27 AM 06/26/2023    5:26 AM 06/25/2023    1:14 PM  BMP  Glucose 70 - 99 mg/dL 952  841  324   BUN 8 - 23 mg/dL 51  37    Creatinine 4.01 - 1.00 mg/dL 0.27  2.53    Sodium 664 - 145 mmol/L 135  138    Potassium 3.5 - 5.1 mmol/L 4.2  4.1    Chloride 98 - 111 mmol/L 102  99    CO2 22 - 32 mmol/L 25  34    Calcium  8.9 - 10.3 mg/dL 9.3  9.6       Assessment and plan Hospital day 9  [name] is a 73 y.o.female pertinent PMH of R carotid artery stenosis s/p endarterectomy, s/p MVR, HFpEF, pAF on warfarin, tobacco use disorder, T2DM, and COPD/emphysema on 2 L [place] at bedtime, who presented from the [facility] with bilateral lower extremity edema and admitted for HFpEF exacerbation.    Acute on chronic HFpEF Pulmonary hypertension Cardiology following, appreciate recommendations. ~ Euvolemic on exam. Vitals/labs look good. -Will reduce po Lasix  80 mg to 40 mg  starting tomorrow. -Cardiology d/c Losartan  and started Entresto  24-26 BID 5/16 -BP stable so will resume Coreg  at lower dose, now 6.25 BID -Continue Aldactone  50. Cardiology recommend starting Jardiance  outpatient after repeat labs. - Strict I&Os, daily weights - Daily BMP, Mg   Paroxysmal afib S/P mitral valve repair Hypertension Stable. Meds per above. Warfarin per pharmacy for MV stenosis s/p bioprothetic valve (2012). INR remains subtherapeutic but improving.    PAD ABI's from 05/2023 show R>L PAD. LE exam consistent with ischemic rubor though this seems to be improving. LDL is 91. Not a candidate for ASA while on Warfarin. Will continue to encourage smoking cessation as this appears to be only modifiable risk factor presently.    COPD Tobacco Use Disorder On Breztri  here (Trelegy at home) and DuoNebs prn. - Maintain SpO2 between 88-92% - Incentive spirometry  - Consider pulmonary rehab outpatient - Nicotine  patches and continued counseling on smoking cessation   T2DM A1c on admission is 5.9. CBG's generally within goal. -Semglee  to 36 units daily + SSI  Depression - Continue venlafaxine , wellbutrin , ramelteon , and xanax    Iron  deficiency anemia Stable at 11.9 today. Has been receiving IV iron  as an outpatient with last infusion on 4/17.    Chronic pain Neuralgia -Continue home Norco 5-325 two tablets q6h PRN and gabapentin  600 mg at bedtime.    Constipation - Continue linzess  - Miralax  daily PRN   Disposition TOC following for SNF placement which has been approved, bed pending.  Ronni Colace, DO 06/27/2023, 10:17 AM  Pager: [phone] After 5pm or weekend: [phone]

## 2023-06-27 NOTE — Progress Notes (Signed)
 PHARMACY - ANTICOAGULATION CONSULT NOTE  Pharmacy Consult for warfarin  Indication: atrial fibrillation  Allergies  Allergen Reactions   Dexilant [Dexlansoprazole] Other (See Comments)    Made acid reflux worse   Metoclopramide      Other Reaction(s): unknown   Isovue  [Iopamidol ] Itching and Rash   Lorazepam Other (See Comments)    Patient's sister noted that ativan caused the patient to become extremely confused during hospitalization 09/2010; tolerates Xanax    Oxycontin  [Oxycodone ] Other (See Comments)    headache   Tramadol Hcl Swelling    Ankle swelling    Patient Measurements: Height: 5\' 2"  (157.5 cm) Weight: 57.9 kg (127 lb 9.6 oz) IBW/kg (Calculated) : 50.1 HEPARIN  DW (KG): 63.3  Vital Signs: Temp: 97.9 F (36.6 C) (05/17 0720) Temp Source: Oral (05/17 0720) BP: 124/48 (05/17 0720) Pulse Rate: 81 (05/17 0720)  Labs: Recent Labs    06/25/23 0251 06/26/23 0526 06/27/23 0227  HGB 12.0 12.8 11.9*  HCT 42.6 44.0 39.7  PLT 293 285 274  LABPROT 20.1* 21.9* 22.7*  INR 1.7* 1.9* 2.0*  CREATININE 1.07* 1.03* 0.97    Estimated Creatinine Clearance: 40.2 mL/min (by C-G formula based on SCr of 0.97 mg/dL).   Medical History: Past Medical History:  Diagnosis Date   Abnormality of lung on CXR 02/02/2020   Nonspecific finding on CXR ordered by pulmonologist - c/w inflammation vs infection, f/u imaging suggested, Dr. Antonette Batters office has been in communication about recommended next steps.   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   Non-bleeding when visualized on capsule endoscopy 06/30/2015    Arthritis    "lower back; hands" (02/19/2018)   Asthma    Asymptomatic cholelithiasis 09/25/2015   Seen on CT scan 08/2015   Carotid artery stenosis; s/p R endarterectomy    s/p right endarterectomy (06/2010) Carotid US  (07/2010):  Left:  Moderate-to-severe (60-79%) calcific and non-calcific plaque origin and proximal ICA and ECA    Chronic congestive heart failure with left ventricular diastolic dysfunction (HCC) 10/21/2010   Chronic constipation 02/03/2011   Chronic daily headache 01/16/2014   Chronic iron  deficiency anemia    Chronic low back pain 10/06/2012   Chronic venous insufficiency 08/04/2012   Closed fracture of one rib of left side 08/23/2021   ED visit after a fall 08/16/21    COPD (chronic obstructive pulmonary disease) with emphysema (HCC)    PFTs 2018: severe obstructive disease, insignif response to bronchiodilator, mild restriction parenchymal pattern, moderately severe diffusion defect. 2014  FEV1 0.92 (40%), ratio 69, 27% increase in FEV1 with BD, TLC 91%, severe airtrapping, DLCO49% On chronic home O2. Pulmonary rehab referral 05/2012    Dyspnea    Fibromyalgia 08/29/2010   Gastroesophageal reflux disease    History of blood transfusion    "several times"  (02/19/2018)   History of clear cell renal cell carcinoma (HCC), in remission 07/21/2011   s/p cryoablation of left RCC in 09/2011 by Dr. Nereida Banning. Followed by Dr. Joie Narrow  [facility]) .     History of fracture of left hip 10/17/2022   History of hiatal hernia    History of mitral valve replacement with bioprosthetic valve due to mitral stenosis 2012   s/p MVR with a 27-mm pericardial porcine valve (Medtronic Mosaic valve, serial #[id number] on 09/20/10, Dr. Matt Song)    History of obstructive sleep apnea, resolved 2013   resolved per sleep study 07/2019; no  apnea, but did have desaturation.  CPAP no longer necessary.  Nocturnal polysomnography (06/2009): Moderate sleep apnea/ hypopnea syndrome , AHI 17.8 per hour with nonpositional hypopneas. CPAP titration to 12 CWP, AHI 2.4 per hour. On nocturnal CPAP via a small resMed Quattro full-face mask with heated humidifier.    History of pneumonia    "once"  (02/19/2018)   History of seborrheic keratosis  09/28/2015   History of stroke without residual deficits 01/17/2022   Hyperlipidemia LDL goal < 100 11/20/2005   Internal and external hemorrhoids without complication 08/04/2012   Lesion of left native kidney 06/01/2020   Incidental finding on recent screening chest CT for lung ca ordered by Dr. Linder Revere pulmonology - he has ordered a f/u renal U/S.    Lichen sclerosus of female genitalia 01/12/2017   Migraine    "none in years" (02/19/2018)   Mild cognitive impairment with memory loss 12/23/2021   Moderately severe major depression (HCC) 11/19/2005   Nocturnal hypoxia per sleep study 07/2019    Osteoporosis    DEXA 2016: T -2.7; DEXA (12/09/2011): L-spine T -3.7, left hip T -1.4 DEXA (12/2004): L-spine T -2.6, left hip -0.1    Paroxysmal atrial fibrillation (HCC) 10/22/2010   s/p Left atrial maze procedure for paroxysmal atrial fibrillation on 09/20/2010 by Dr Matt Song.  Subsequent splenic infarct, decision was made to re-anticoagulate with coumadin , likely life-long as this is the most likely cause of the splenic infarct.    Personal history of colonic polyps 05/14/2011   Colonoscopy (05/2011): 4 mm adenomatous polyp excised endoscopically Colonoscopy (02/2002): Adenomatous polyp excised endoscopically    Personal history of renal cell carcinoma 09/12/2020   Pneumonia    Pulmonary hypertension due to chronic obstructive pulmonary disease (HCC) 04/25/2016   2014 TEE w PA peak pressure 46 mmHg, s/p MV replacement    Right nephrolithiasis, asymptomatic, incidental finding 09/06/2014   5 mm non-obstructing calculus seen on CT scan 09/05/2014    Right ventricular failure (HCC) 04/25/2016   Severe obesity (BMI 35.0-39.9) with comorbidity (HCC) 10/23/2011   Sleep apnea    Stage 2 chronic kidney disease due to type 2 diabetes mellitus (HCC) 12/16/2018   CKD stage III     Tobacco abuse 07/28/2012   Type 2 diabetes mellitus with complication, with long-term current use of insulin  (HCC)    Type 2  diabetes mellitus with diabetic neuropathy (HCC)    Weight loss due to medication 03/12/2023    Medications:  Scheduled:   acetaminophen   1,000 mg Oral Once   ALPRAZolam   0.5 mg Oral QHS   budesonide -glycopyrrolate -formoterol   2 puff Inhalation BID   buPROPion   150 mg Oral BH-q7a   carvedilol   6.25 mg Oral BID WC   furosemide   80 mg Oral Daily   gabapentin   600 mg Oral QHS   insulin  aspart  0-15 Units Subcutaneous TID WC   insulin  aspart  3 Units Subcutaneous TID WC   insulin  glargine-yfgn  36 Units Subcutaneous Daily   linaclotide   72 mcg Oral QAC breakfast   multivitamin with minerals  1 tablet Oral Daily   nicotine   14 mg Transdermal Daily   pantoprazole   40 mg Oral Daily   Ensure Max Protein  11 oz Oral Daily   ramelteon   8 mg Oral QHS   sacubitril -valsartan   1 tablet Oral BID   spironolactone   50 mg Oral Daily   venlafaxine  XR  150 mg Oral Q breakfast   Warfarin - Pharmacist Dosing Inpatient   Does  not apply q1600    Assessment: 73YOF with PMH of R carotid artery stenosis s/p endarterectomy, s/p MVR w/ bioprosthetic MV (on warfarin PTA), HFpEF, pAF admitted with HFpEF exacerbation. Pharmacy consulted to dose warfarin.  PTA warfarin regimen: 1.25 mg Mon, 2.5 mg all other days per 5/6 anticoag visit  Previously receiving feeding supplement Ensure Max Protein which provides 20% of the recommended daily requirement of vitamin K1 . Was discontinued 5/16. Anticipate warfarin dosing requirements will decrease with the discontinuation of the supplement.  INR 2.0 today, just therapeutic for desired range. Last dose of warfarin received 5/16 was 5mg  x1. Hgb and plt stable, WNL. No signs/symptoms of bleeding noted.   Goal of Therapy:  INR target range as per patient's PCP, Dr. Concha Deed A. Broadus Canes has been set at 2.0 - 2.5.   Plan:  Warfarin 5mg  PO x1 tonight Daily INR Monitor CBC and signs/symptoms of bleeding Monitor changes in appetite and for new drug interactions   Jerrel Mor, PharmD PGY1 Pharmacy Resident 06/27/2023 8:47 AM

## 2023-06-27 NOTE — Plan of Care (Signed)
   Problem: Clinical Measurements: Goal: Ability to maintain clinical measurements within normal limits will improve Outcome: Progressing Goal: Will remain free from infection Outcome: Progressing Goal: Diagnostic test results will improve Outcome: Progressing Goal: Respiratory complications will improve Outcome: Progressing

## 2023-06-27 NOTE — Progress Notes (Signed)
 Mobility Specialist: Progress Note   06/27/23 1249  Mobility  Activity Ambulated with assistance in hallway  Level of Assistance Contact guard assist, steadying assist  Assistive Device Front wheel walker  Distance Ambulated (ft) 100 ft  Activity Response Tolerated fair  Mobility Referral Yes  Mobility visit 1 Mobility  Mobility Specialist Start Time (ACUTE ONLY) 1150  Mobility Specialist Stop Time (ACUTE ONLY) 1210  Mobility Specialist Time Calculation (min) (ACUTE ONLY) 20 min    During Mobility: SpO2 94-96% 1LO2 Post Mobility: SpO2 98% 1LO2  Pt was agreeable to mobility session - received on EOB. C/o a headache and nausea. RN present at that time and was made aware by pt. MinG for ambulation. Pt unsteady throughout with 2x LOB requiring physical assist to correct. Stated LOB was d/t lightheadedness. Further distance limited d/t nausea. Returned to room without fault. Left on EOB with all needs met, call bell in reach.   Deloria Fetch Mobility Specialist Please contact via SecureChat or Rehab office at [phone]

## 2023-06-27 NOTE — TOC Progression Note (Addendum)
 Transition of Care [facility]) - Initial/Assessment Note    Patient Details  Name: [name] MRN: [id number] Date of Birth: 07-06-1950  Transition of Care [facility]) CM/SW Contact:    Maya Sparrow, LCSW Phone Number: 06/27/2023, 9:35 AM  Clinical Narrative:                 CSW received consult due to possible discharge and contacted [facility] to inquire about bed availability and is awaiting a response.   09:58-  CSW informed that [facility] does not have a bed available for pt at this time and will inform CSW if a bed becomes available over the weekend.    TOC will continue to follow.   Expected Discharge Plan: Skilled Nursing Facility Barriers to Discharge: Continued Medical Work up, English as a second language teacher   Patient Goals and CMS Choice Patient states their goals for this hospitalization and ongoing recovery are:: To getter and retrun home   Choice offered to / list presented to : Sibling [place] ownership interest in [facility].provided to:: Sibling    Expected Discharge Plan and Services     Post Acute Care Choice: Skilled Nursing Facility Living arrangements for the past 2 months: Single Family Home                                      Prior Living Arrangements/Services Living arrangements for the past 2 months: Single Family Home Lives with:: Self Patient language and need for interpreter reviewed:: No Do you feel safe going back to the place where you live?: Yes      Need for Family Participation in Patient Care: Yes (Comment) Care giver support system in place?: Yes (comment)   Criminal Activity/Legal Involvement Pertinent to Current Situation/Hospitalization: No - Comment as needed  Activities of Daily Living   ADL Screening (condition at time of admission) Independently performs ADLs?: No Does the patient have a NEW difficulty with bathing/dressing/toileting/self-feeding that is expected to last >3 days?: No Does the  patient have a NEW difficulty with getting in/out of bed, walking, or climbing stairs that is expected to last >3 days?: Yes (Initiates electronic notice to provider for possible PT consult) Does the patient have a NEW difficulty with communication that is expected to last >3 days?: No Is the patient deaf or have difficulty hearing?: Yes Does the patient have difficulty seeing, even when wearing glasses/contacts?: No Does the patient have difficulty concentrating, remembering, or making decisions?: No  Permission Sought/Granted Permission sought to share information with : Case Manager, Magazine features editor Permission granted to share information with : Yes, Release of Information Signed  Share Information with NAME: Brower,Beverly     Permission granted to share info w Relationship: Sister  Permission granted to share info w Contact Information: [phone]  Emotional Assessment   Attitude/Demeanor/Rapport: Unable to Assess Affect (typically observed): Unable to Assess Orientation: : Oriented to Self, Oriented to Place, Oriented to  Time, Oriented to Situation Alcohol / Substance Use: Not Applicable Psych Involvement: No (comment)  Admission diagnosis:  Acute on chronic heart failure (HCC) [I50.9] Acute on chronic congestive heart failure, unspecified heart failure type [facility]) [I50.9] Patient Active Problem List   Diagnosis Date Noted   S/P MVR (mitral valve replacement) 06/23/2023   Pulmonary hypertension (HCC) 06/20/2023   Malnutrition of moderate degree 06/19/2023   Acute exacerbation of chronic obstructive pulmonary disease (COPD) (HCC) 05/20/2023  Hypertensive urgency 05/20/2023   Iron  deficiency anemia 05/19/2023   IDA (iron  deficiency anemia) 05/19/2023   Atrial fibrillation (HCC) 12/23/2022   History of atrial fibrillation 10/18/2022   Chronic anticoagulation 10/18/2022   History of fracture of left hip 10/17/2022   Generalized pain 08/01/2022    Multifactorial functional impairment 02/13/2022   History of stroke without residual deficits 01/17/2022   Diabetic peripheral neuropathy associated with type 2 diabetes mellitus (HCC) 11/11/2021   Recurrent falls 11/11/2021   Gait instability 09/19/2021   Perennial non-allergic rhinitis 09/19/2021   Chronic, continuous use of opioids for chronic pain 07/10/2021   Polypharmacy 11/08/2020   Personal history of renal cell carcinoma 09/12/2020   Tinea pedis of both feet 08/07/2020   Essential tremor 11/17/2019   Nocturnal hypoxia per sleep study 07/2019    Carotid artery stenosis; s/p R endarterectomy    COPD mixed type (HCC)    Arthritis of both hands 07/01/2019   Stage 2 chronic kidney disease due to type 2 diabetes mellitus (HCC) 12/16/2018   Lichen sclerosus of female genitalia 01/12/2017   Mixed stress and urge urinary incontinence 05/27/2016   Pulmonary hypertension due to chronic obstructive pulmonary disease (HCC) 04/25/2016   Asymptomatic cholelithiasis 09/25/2015   Nausea 07/14/2015   Chronic iron  deficiency anemia 12/26/2014   Aortic atherosclerosis (HCC) 10/19/2014   Right nephrolithiasis, asymptomatic (incidental finding) 09/06/2014   Headache, chronic intermittent 01/14/2013   Chronic low back pain 10/06/2012   Long term current use of anticoagulant therapy 09/30/2012   Recurrent candidal intertrigo 07/28/2012   Tobacco use disorder 07/28/2012   History of colonic polyps 05/14/2011   Sleep disturbance 05/14/2011   Abdominal wall hernia 05/14/2011   Constipation due to opioid therapy 02/03/2011   Shortness of breath 12/06/2010   Health care maintenance 11/04/2010   PAF (paroxysmal atrial fibrillation) (HCC) 10/22/2010   Acute on chronic heart failure with preserved ejection fraction (HFpEF) (HCC) 10/21/2010   Fibromyalgia 08/29/2010   Gastroesophageal reflux disease    Type 2 diabetes mellitus with complication, with long-term current use of insulin  (HCC)     Osteoporosis with current pathological fracture with routine healing    Peripheral arterial disease (HCC) 05/27/2010   History of mitral valve replacement with bioprosthetic valve 2012   Hypertension 06/19/2008   Hyperlipidemia 11/20/2005   Severe major depression (HCC) 11/19/2005   PCP:  Sherol Dixie, MD Pharmacy:   [facility] [address] [place] [address] Phone: [phone] Fax: [phone]     Social Drivers of Health (SDOH) Social History: SDOH Screenings   Food Insecurity: No Food Insecurity (06/18/2023)  Recent Concern: Food Insecurity - Food Insecurity Present (05/25/2023)  Housing: Low Risk  (06/18/2023)  Transportation Needs: No Transportation Needs (06/18/2023)  Recent Concern: Transportation Needs - Unmet Transportation Needs (04/08/2023)  Utilities: Not At Risk (06/18/2023)  Alcohol Screen: Low Risk  (04/08/2023)  Depression (PHQ2-9): High Risk (04/08/2023)  Financial Resource Strain: Low Risk  (04/08/2023)  Physical Activity: Inactive (04/08/2023)  Social Connections: Socially Isolated (06/18/2023)  Stress: Stress Concern Present (04/08/2023)  Tobacco Use: High Risk (06/18/2023)  Health Literacy: Adequate Health Literacy (04/08/2023)   SDOH Interventions:     Readmission Risk Interventions     No data to display

## 2023-06-27 NOTE — Plan of Care (Signed)
  Problem: Education: Goal: Knowledge of General Education information will improve Description: Including pain rating scale, medication(s)/side effects and non-pharmacologic comfort measures Outcome: Progressing   Problem: Health Behavior/Discharge Planning: Goal: Ability to manage health-related needs will improve Outcome: Progressing   Problem: Clinical Measurements: Goal: Ability to maintain clinical measurements within normal limits will improve Outcome: Progressing Goal: Will remain free from infection Outcome: Progressing Goal: Diagnostic test results will improve Outcome: Progressing Goal: Respiratory complications will improve Outcome: Progressing Goal: Cardiovascular complication will be avoided Outcome: Progressing   Problem: Activity: Goal: Risk for activity intolerance will decrease Outcome: Progressing   Problem: Nutrition: Goal: Adequate nutrition will be maintained Outcome: Progressing   Problem: Coping: Goal: Level of anxiety will decrease Outcome: Progressing   Problem: Elimination: Goal: Will not experience complications related to bowel motility Outcome: Progressing Goal: Will not experience complications related to urinary retention Outcome: Progressing   Problem: Pain Managment: Goal: General experience of comfort will improve and/or be controlled Outcome: Progressing   Problem: Safety: Goal: Ability to remain free from injury will improve Outcome: Progressing   Problem: Skin Integrity: Goal: Risk for impaired skin integrity will decrease Outcome: Progressing   Problem: Education: Goal: Ability to describe self-care measures that may prevent or decrease complications (Diabetes Survival Skills Education) will improve Outcome: Progressing Goal: Individualized Educational Video(s) Outcome: Progressing   Problem: Coping: Goal: Ability to adjust to condition or change in health will improve Outcome: Progressing   Problem: Fluid  Volume: Goal: Ability to maintain a balanced intake and output will improve Outcome: Progressing   Problem: Health Behavior/Discharge Planning: Goal: Ability to identify and utilize available resources and services will improve Outcome: Progressing Goal: Ability to manage health-related needs will improve Outcome: Progressing   Problem: Nutritional: Goal: Maintenance of adequate nutrition will improve Outcome: Progressing Goal: Progress toward achieving an optimal weight will improve Outcome: Progressing   Problem: Tissue Perfusion: Goal: Adequacy of tissue perfusion will improve Outcome: Progressing

## 2023-06-28 ENCOUNTER — Inpatient Hospital Stay (HOSPITAL_COMMUNITY)

## 2023-06-28 DIAGNOSIS — I5033 Acute on chronic diastolic (congestive) heart failure: Secondary | ICD-10-CM | POA: Diagnosis not present

## 2023-06-28 DIAGNOSIS — I480 Paroxysmal atrial fibrillation: Secondary | ICD-10-CM | POA: Diagnosis not present

## 2023-06-28 DIAGNOSIS — W19XXXA Unspecified fall, initial encounter: Secondary | ICD-10-CM

## 2023-06-28 DIAGNOSIS — I110 Hypertensive heart disease with heart failure: Secondary | ICD-10-CM | POA: Diagnosis not present

## 2023-06-28 DIAGNOSIS — I272 Pulmonary hypertension, unspecified: Secondary | ICD-10-CM | POA: Diagnosis not present

## 2023-06-28 LAB — GLUCOSE, CAPILLARY
Glucose-Capillary: 204 mg/dL — ABNORMAL HIGH (ref 70–99)
Glucose-Capillary: 190 mg/dL — ABNORMAL HIGH (ref 70–99)
Glucose-Capillary: 182 mg/dL — ABNORMAL HIGH (ref 70–99)
Glucose-Capillary: 206 mg/dL — ABNORMAL HIGH (ref 70–99)

## 2023-06-28 LAB — BASIC METABOLIC PANEL WITH GFR
Anion gap: 7 (ref 5–15)
BUN: 52 mg/dL — ABNORMAL HIGH (ref 8–23)
CO2: 29 mmol/L (ref 22–32)
Calcium: 10 mg/dL (ref 8.9–10.3)
Chloride: 99 mmol/L (ref 98–111)
Creatinine, Ser: 1.19 mg/dL — ABNORMAL HIGH (ref 0.44–1.00)
GFR, Estimated: 48 mL/min — ABNORMAL LOW (ref >60)
Glucose, Bld: 290 mg/dL — ABNORMAL HIGH (ref 70–99)
Potassium: 4.9 mmol/L (ref 3.5–5.1)
Sodium: 135 mmol/L (ref 135–145)

## 2023-06-28 LAB — PROTIME-INR
INR: 1.9 — ABNORMAL HIGH (ref 0.8–1.2)
Prothrombin Time: 22 s — ABNORMAL HIGH (ref 11.4–15.2)

## 2023-06-28 LAB — MAGNESIUM: Magnesium: 2.5 mg/dL — ABNORMAL HIGH (ref 1.7–2.4)

## 2023-06-28 MED ORDER — INSULIN ASPART 100 UNIT/ML IJ SOLN
4.0000 [IU] | Freq: Three times a day (TID) | INTRAMUSCULAR | Status: DC
  Administered 2023-06-28 – 2023-06-30 (×7): 4 [IU] via SUBCUTANEOUS

## 2023-06-28 MED ORDER — FUROSEMIDE 40 MG PO TABS: 60.0000 mg | ORAL_TABLET | Freq: Every day | ORAL | Status: DC

## 2023-06-28 MED ORDER — INSULIN ASPART 100 UNIT/ML IJ SOLN
0.0000 [IU] | Freq: Every day | INTRAMUSCULAR | Status: DC
  Administered 2023-06-28 – 2023-06-29 (×2): 2 [IU] via SUBCUTANEOUS

## 2023-06-28 MED ORDER — DICLOFENAC SODIUM 1 % EX GEL
4.0000 g | Freq: Three times a day (TID) | CUTANEOUS | Status: DC
  Administered 2023-06-28 – 2023-07-01 (×13): 4 g via TOPICAL
  Filled 2023-06-28: qty 100

## 2023-06-28 MED ORDER — INSULIN GLARGINE-YFGN 100 UNIT/ML ~~LOC~~ SOLN
40.0000 [IU] | Freq: Every day | SUBCUTANEOUS | Status: DC
  Administered 2023-06-29 – 2023-07-01 (×3): 40 [IU] via SUBCUTANEOUS
  Filled 2023-06-28 (×3): qty 0.4

## 2023-06-28 MED ORDER — LIDOCAINE 5 % EX PTCH
1.0000 | MEDICATED_PATCH | CUTANEOUS | Status: DC
  Administered 2023-06-28 – 2023-06-30 (×3): 1 via TRANSDERMAL
  Filled 2023-06-28 (×3): qty 1

## 2023-06-28 MED ORDER — WARFARIN SODIUM 5 MG PO TABS
5.0000 mg | ORAL_TABLET | Freq: Once | ORAL | Status: AC
  Administered 2023-06-28: 5 mg via ORAL
  Filled 2023-06-28: qty 1

## 2023-06-28 MED ORDER — INSULIN ASPART 100 UNIT/ML IJ SOLN
0.0000 [IU] | Freq: Three times a day (TID) | INTRAMUSCULAR | Status: DC
  Administered 2023-06-28: 3 [IU] via SUBCUTANEOUS
  Administered 2023-06-29: 8 [IU] via SUBCUTANEOUS
  Administered 2023-06-29: 2 [IU] via SUBCUTANEOUS
  Administered 2023-06-30: 3 [IU] via SUBCUTANEOUS
  Administered 2023-06-30: 2 [IU] via SUBCUTANEOUS
  Administered 2023-07-01: 5 [IU] via SUBCUTANEOUS

## 2023-06-28 NOTE — Plan of Care (Signed)
   Problem: Education: Goal: Knowledge of General Education information will improve Description: Including pain rating scale, medication(s)/side effects and non-pharmacologic comfort measures Outcome: Progressing   Problem: Health Behavior/Discharge Planning: Goal: Ability to manage health-related needs will improve Outcome: Progressing   Problem: Clinical Measurements: Goal: Will remain free from infection Outcome: Progressing Goal: Diagnostic test results will improve Outcome: Progressing Goal: Respiratory complications will improve Outcome: Progressing Goal: Cardiovascular complication will be avoided Outcome: Progressing   Problem: Activity: Goal: Risk for activity intolerance will decrease Outcome: Progressing

## 2023-06-28 NOTE — Progress Notes (Signed)
 Report of patient fall from charge RN stating NT Jacqlyn Matas found patient in the floor and assisted her back to bed.  Upon assessment patient remains alert and oriented x4.  No distress is noted at this time.  She reports sitting on the side of the bed and toppling over.  She denies loss of consciousness or hitting her head.  She sustained a skin tear to her right elbow and right knee. Foam dressings were placed on both skin tears.  Dr. Maxie Spaniel notified of fall and came to the bedside to assess patient.  Patient in bed and remains alert and oriented x4 and vital signs are stable.  Patient refused family notification.  Yellow arm band applied, bed in lowest position, bed alarm on and patient instructed to call for assistance when getting out of bed.    06/28/23 0423  What Happened  Was fall witnessed? No  Was patient injured? Yes  Patient found on floor  Found by Staff-comment (Meagan NT)  Stated prior activity ambulating-unassisted  Provider Notification  Provider Name/Title Dr. Carolee Churchman  Date Provider Notified 06/28/23  Time Provider Notified [phone]  Method of Notification Page  Notification Reason Fall  Provider response At bedside  Follow Up  Family notified No - patient refusal  Simple treatment Dressing  Adult Fall Risk Assessment  Risk Factor Category (scoring not indicated) High fall risk per protocol (document High fall risk)  Patient Fall Risk Level High fall risk  Adult Fall Risk Interventions  Required Bundle Interventions *See Row Information* High fall risk - low, moderate, and high requirements implemented  Screening for Fall Injury Risk (To be completed on HIGH fall risk patients) - Assessing Need for Floor Mats  Risk For Fall Injury- Criteria for Floor Mats Bleeding risk-anticoagulation (not prophylaxis)  Will Implement Floor Mats Yes  Vitals  Temp 97.8 F (36.6 C)  Temp Source Oral  BP (!) 140/50  MAP (mmHg) 73  BP Location Left Arm  BP Method Automatic  Patient  Position (if appropriate) Lying  Pulse Rate 65  ECG Heart Rate 64  Resp 20  Oxygen  Therapy  O2 Device Nasal Cannula  O2 Flow Rate (L/min) 2 L/min  Pain Assessment  Pain Score 2  Pain Type Chronic pain  Pain Location Back  Neurological  Neuro (WDL) WDL  Level of Consciousness Alert  Glasgow Coma Scale  Eye Opening 4  Best Verbal Response (NON-intubated) 5  Best Motor Response 6  Glasgow Coma Scale Score 15  Musculoskeletal  Musculoskeletal (WDL) X  Generalized Weakness Yes  Weight Bearing Restrictions Per Provider Order No  Integumentary  Integumentary (WDL) X  Skin Color Appropriate for ethnicity  Skin Condition Dry  Skin Integrity Erythema/redness;Ecchymosis  Ecchymosis Location Arm  Ecchymosis Location Orientation Bilateral  Erythema/Redness Location Leg  Erythema/Redness Location Orientation Bilateral  Skin Turgor Non-tenting

## 2023-06-28 NOTE — TOC Progression Note (Addendum)
 Transition of Care [facility]) - Initial/Assessment Note    Patient Details  Name: [name] MRN: [id number] Date of Birth: 1950-07-09  Transition of Care [facility]) CM/SW Contact:    Maya Sparrow, LCSW Phone Number: 06/28/2023, 8:26 AM  Clinical Narrative:                 CSW contacted admissions with [facility] (SNF) to inquire about bed availability and is awaiting a response.    09:30- CSW informed by admissions at SNF that the facility does not have a bed available today.    TOC will continue to follow.  Expected Discharge Plan: Skilled Nursing Facility Barriers to Discharge: Continued Medical Work up, English as a second language teacher   Patient Goals and CMS Choice Patient states their goals for this hospitalization and ongoing recovery are:: To getter and retrun home   Choice offered to / list presented to : Sibling [place] ownership interest in [facility].provided to:: Sibling    Expected Discharge Plan and Services     Post Acute Care Choice: Skilled Nursing Facility Living arrangements for the past 2 months: Single Family Home                                      Prior Living Arrangements/Services Living arrangements for the past 2 months: Single Family Home Lives with:: Self Patient language and need for interpreter reviewed:: No Do you feel safe going back to the place where you live?: Yes      Need for Family Participation in Patient Care: Yes (Comment) Care giver support system in place?: Yes (comment)   Criminal Activity/Legal Involvement Pertinent to Current Situation/Hospitalization: No - Comment as needed  Activities of Daily Living   ADL Screening (condition at time of admission) Independently performs ADLs?: No Does the patient have a NEW difficulty with bathing/dressing/toileting/self-feeding that is expected to last >3 days?: No Does the patient have a NEW difficulty with getting in/out of bed, walking, or climbing stairs  that is expected to last >3 days?: Yes (Initiates electronic notice to provider for possible PT consult) Does the patient have a NEW difficulty with communication that is expected to last >3 days?: No Is the patient deaf or have difficulty hearing?: Yes Does the patient have difficulty seeing, even when wearing glasses/contacts?: No Does the patient have difficulty concentrating, remembering, or making decisions?: No  Permission Sought/Granted Permission sought to share information with : Case Manager, Magazine features editor Permission granted to share information with : Yes, Release of Information Signed  Share Information with NAME: Brower,Beverly     Permission granted to share info w Relationship: Sister  Permission granted to share info w Contact Information: [phone]  Emotional Assessment   Attitude/Demeanor/Rapport: Unable to Assess Affect (typically observed): Unable to Assess Orientation: : Oriented to Self, Oriented to Place, Oriented to  Time, Oriented to Situation Alcohol / Substance Use: Not Applicable Psych Involvement: No (comment)  Admission diagnosis:  Acute on chronic heart failure (HCC) [I50.9] Acute on chronic congestive heart failure, unspecified heart failure type [facility]) [I50.9] Patient Active Problem List   Diagnosis Date Noted   S/P MVR (mitral valve replacement) 06/23/2023   Pulmonary hypertension (HCC) 06/20/2023   Malnutrition of moderate degree 06/19/2023   Acute exacerbation of chronic obstructive pulmonary disease (COPD) (HCC) 05/20/2023   Hypertensive urgency 05/20/2023   Iron  deficiency anemia 05/19/2023   IDA (iron  deficiency anemia) 05/19/2023  Atrial fibrillation (HCC) 12/23/2022   History of atrial fibrillation 10/18/2022   Chronic anticoagulation 10/18/2022   History of fracture of left hip 10/17/2022   Generalized pain 08/01/2022   Multifactorial functional impairment 02/13/2022   History of stroke without residual deficits  01/17/2022   Diabetic peripheral neuropathy associated with type 2 diabetes mellitus (HCC) 11/11/2021   Recurrent falls 11/11/2021   Gait instability 09/19/2021   Perennial non-allergic rhinitis 09/19/2021   Chronic, continuous use of opioids for chronic pain 07/10/2021   Polypharmacy 11/08/2020   Personal history of renal cell carcinoma 09/12/2020   Tinea pedis of both feet 08/07/2020   Essential tremor 11/17/2019   Nocturnal hypoxia per sleep study 07/2019    Carotid artery stenosis; s/p R endarterectomy    COPD mixed type (HCC)    Arthritis of both hands 07/01/2019   Stage 2 chronic kidney disease due to type 2 diabetes mellitus (HCC) 12/16/2018   Lichen sclerosus of female genitalia 01/12/2017   Mixed stress and urge urinary incontinence 05/27/2016   Pulmonary hypertension due to chronic obstructive pulmonary disease (HCC) 04/25/2016   Asymptomatic cholelithiasis 09/25/2015   Nausea 07/14/2015   Chronic iron  deficiency anemia 12/26/2014   Aortic atherosclerosis (HCC) 10/19/2014   Right nephrolithiasis, asymptomatic (incidental finding) 09/06/2014   Headache, chronic intermittent 01/14/2013   Chronic low back pain 10/06/2012   Long term current use of anticoagulant therapy 09/30/2012   Recurrent candidal intertrigo 07/28/2012   Tobacco use disorder 07/28/2012   History of colonic polyps 05/14/2011   Sleep disturbance 05/14/2011   Abdominal wall hernia 05/14/2011   Constipation due to opioid therapy 02/03/2011   Shortness of breath 12/06/2010   Health care maintenance 11/04/2010   PAF (paroxysmal atrial fibrillation) (HCC) 10/22/2010   Acute on chronic heart failure with preserved ejection fraction (HFpEF) (HCC) 10/21/2010   Fibromyalgia 08/29/2010   Gastroesophageal reflux disease    Type 2 diabetes mellitus with complication, with long-term current use of insulin  (HCC)    Osteoporosis with current pathological fracture with routine healing    Peripheral arterial disease  (HCC) 05/27/2010   History of mitral valve replacement with bioprosthetic valve 2012   Hypertension 06/19/2008   Hyperlipidemia 11/20/2005   Severe major depression (HCC) 11/19/2005   PCP:  Sherol Dixie, MD Pharmacy:   [facility] [address] [place] [address] Phone: [phone] Fax: [phone]     Social Drivers of Health (SDOH) Social History: SDOH Screenings   Food Insecurity: No Food Insecurity (06/18/2023)  Recent Concern: Food Insecurity - Food Insecurity Present (05/25/2023)  Housing: Low Risk  (06/18/2023)  Transportation Needs: No Transportation Needs (06/18/2023)  Recent Concern: Transportation Needs - Unmet Transportation Needs (04/08/2023)  Utilities: Not At Risk (06/18/2023)  Alcohol Screen: Low Risk  (04/08/2023)  Depression (PHQ2-9): High Risk (04/08/2023)  Financial Resource Strain: Low Risk  (04/08/2023)  Physical Activity: Inactive (04/08/2023)  Social Connections: Socially Isolated (06/18/2023)  Stress: Stress Concern Present (04/08/2023)  Tobacco Use: High Risk (06/18/2023)  Health Literacy: Adequate Health Literacy (04/08/2023)   SDOH Interventions:     Readmission Risk Interventions     No data to display

## 2023-06-28 NOTE — Plan of Care (Signed)
  Problem: Education: Goal: Knowledge of General Education information will improve Description: Including pain rating scale, medication(s)/side effects and non-pharmacologic comfort measures Outcome: Progressing   Problem: Health Behavior/Discharge Planning: Goal: Ability to manage health-related needs will improve Outcome: Progressing   Problem: Clinical Measurements: Goal: Ability to maintain clinical measurements within normal limits will improve Outcome: Progressing Goal: Will remain free from infection Outcome: Progressing Goal: Diagnostic test results will improve Outcome: Progressing Goal: Respiratory complications will improve Outcome: Progressing Goal: Cardiovascular complication will be avoided Outcome: Progressing   Problem: Activity: Goal: Risk for activity intolerance will decrease Outcome: Progressing   Problem: Coping: Goal: Level of anxiety will decrease Outcome: Progressing   Problem: Elimination: Goal: Will not experience complications related to bowel motility Outcome: Progressing Goal: Will not experience complications related to urinary retention Outcome: Progressing   Problem: Pain Managment: Goal: General experience of comfort will improve and/or be controlled Outcome: Progressing   Problem: Safety: Goal: Ability to remain free from injury will improve Outcome: Progressing   Problem: Education: Goal: Ability to describe self-care measures that may prevent or decrease complications (Diabetes Survival Skills Education) will improve Outcome: Progressing Goal: Individualized Educational Video(s) Outcome: Progressing   Problem: Coping: Goal: Ability to adjust to condition or change in health will improve Outcome: Progressing   Problem: Fluid Volume: Goal: Ability to maintain a balanced intake and output will improve Outcome: Progressing   Problem: Health Behavior/Discharge Planning: Goal: Ability to identify and utilize available resources  and services will improve Outcome: Progressing Goal: Ability to manage health-related needs will improve Outcome: Progressing   Problem: Metabolic: Goal: Ability to maintain appropriate glucose levels will improve Outcome: Progressing   Problem: Nutritional: Goal: Maintenance of adequate nutrition will improve Outcome: Progressing Goal: Progress toward achieving an optimal weight will improve Outcome: Progressing   Problem: Skin Integrity: Goal: Risk for impaired skin integrity will decrease Outcome: Progressing   Problem: Tissue Perfusion: Goal: Adequacy of tissue perfusion will improve Outcome: Progressing

## 2023-06-28 NOTE — Progress Notes (Signed)
 Subjective Denies hitting head/LOC from fall. Right arm/shoulder really bothering her.  Physical exam Blood pressure (!) 140/50, pulse 65, temperature 97.8 F (36.6 C), temperature source Oral, resp. rate 20, height 5\' 2"  (1.575 m), weight 58.8 kg, SpO2 95%.   Constitutional: sitting up in bed, in no acute distress Cardiovascular: regular rate and rhythm, no m/r/g, no LEE Pulmonary/Chest: normal work of breathing on RA, coarse sounds bilaterally MSK: right arm/shoulder without overt deformity, non-erythematous, no ecchymosis/swelling  Neurological: alert & oriented  Weight change: 0.953 kg   Intake/Output Summary (Last 24 hours) at 06/28/2023 0859 Last data filed at 06/28/2023 0852 Gross per 24 hour  Intake 720 ml  Output 1500 ml  Net -780 ml   Net IO Since Admission: -8,147.56 mL [06/28/23 0859]  Labs, images, and other studies    Latest Ref Rng & Units 06/27/2023    2:27 AM 06/26/2023    5:26 AM 06/25/2023    2:51 AM  CBC  WBC 4.0 - 10.5 K/uL 9.2  8.8  15.8   Hemoglobin 12.0 - 15.0 g/dL 16.1  09.6  04.5   Hematocrit 36.0 - 46.0 % 39.7  44.0  42.6   Platelets 150 - 400 K/uL 274  285  293        Latest Ref Rng & Units 06/28/2023    2:46 AM 06/27/2023    2:27 AM 06/26/2023    5:26 AM  BMP  Glucose 70 - 99 mg/dL 409  811  914   BUN 8 - 23 mg/dL 52  51  37   Creatinine 0.44 - 1.00 mg/dL 7.82  9.56  2.13   Sodium 135 - 145 mmol/L 135  135  138   Potassium 3.5 - 5.1 mmol/L 4.9  4.2  4.1   Chloride 98 - 111 mmol/L 99  102  99   CO2 22 - 32 mmol/L 29  25  34   Calcium  8.9 - 10.3 mg/dL 08.6  9.3  9.6      Assessment and plan Hospital day 10  [name] is a 73 y.o.female pertinent PMH of R carotid artery stenosis s/p endarterectomy, s/p MVR, HFpEF, pAF on warfarin, tobacco use disorder, T2DM, and COPD/emphysema on 2 L [place] at bedtime, who presented from the [facility] with bilateral lower extremity edema and admitted for HFpEF exacerbation.    Acute  on chronic HFpEF Pulmonary hypertension Euvolemic on exam. Vitals/labs look good. Given fall, order orthostatics this morning which were borderline. Will hold po Lasix  today and plan on resuming at 40 daily tomorrow/upon discharge. -Cardiology d/c Losartan  and started Entresto  24-26 BID 5/16 -BP stable so will resume Coreg  at lower dose, now 6.25 BID -Continue Aldactone  50. Cardiology recommend starting Jardiance  outpatient after repeat labs. - Strict I&Os, daily weights - Daily BMP, Mg   Paroxysmal afib S/P mitral valve repair Hypertension Stable. Meds per above. Warfarin per pharmacy for MV stenosis s/p bioprothetic valve (2012). INR remains subtherapeutic.  Fall Seems mechanical in nature. Given right arm/shoulder pain, shoulder X-ray ordered which was unremarkable. Will manage with Voltaren  gel in addition to home analgesics for now.   PAD ABI's from 05/2023 show R>L PAD. LE exam consistent with ischemic rubor though this seems to be improving. LDL is 91. Not a candidate for ASA while on Warfarin. Will continue to encourage smoking cessation as this appears to be only modifiable risk factor presently.  COPD Tobacco Use Disorder On Breztri  here (Trelegy at home) and DuoNebs prn. - Maintain SpO2 between 88-92% - Incentive spirometry  - Consider pulmonary rehab outpatient - Nicotine  patches and continued counseling on smoking cessation   T2DM A1c on admission is 5.9. CBG's generally within goal. -Semglee  to 36 units daily + SSI   Depression - Continue venlafaxine , wellbutrin , ramelteon , and xanax    Iron  deficiency anemia Stable. Has been receiving IV iron  as an outpatient with last infusion on 4/17.    Chronic pain Neuralgia -Continue home Norco 5-325 two tablets q6h PRN and gabapentin  600 mg at bedtime.    Constipation - Continue linzess  - Miralax  daily PRN   Disposition TOC following for SNF placement which has been approved, bed pending.   Ronni Colace,  DO 06/28/2023, 8:59 AM  Pager: [phone] After 5pm or weekend: [phone]

## 2023-06-28 NOTE — Progress Notes (Addendum)
 PHARMACY - ANTICOAGULATION CONSULT NOTE  Pharmacy Consult for warfarin  Indication: atrial fibrillation  Allergies  Allergen Reactions   Dexilant [Dexlansoprazole] Other (See Comments)    Made acid reflux worse   Metoclopramide      Other Reaction(s): unknown   Isovue  [Iopamidol ] Itching and Rash   Lorazepam Other (See Comments)    Patient's sister noted that ativan caused the patient to become extremely confused during hospitalization 09/2010; tolerates Xanax    Oxycontin  [Oxycodone ] Other (See Comments)    headache   Tramadol Hcl Swelling    Ankle swelling    Patient Measurements: Height: 5\' 2"  (157.5 cm) Weight: 58.8 kg (129 lb 11.2 oz) IBW/kg (Calculated) : 50.1 HEPARIN  DW (KG): 63.3  Vital Signs: Temp: 97.8 F (36.6 C) (05/18 0423) Temp Source: Oral (05/18 0423) BP: 140/50 (05/18 0423) Pulse Rate: 65 (05/18 0423)  Labs: Recent Labs    06/26/23 0526 06/27/23 0227 06/28/23 0246  HGB 12.8 11.9*  --   HCT 44.0 39.7  --   PLT 285 274  --   LABPROT 21.9* 22.7* 22.0*  INR 1.9* 2.0* 1.9*  CREATININE 1.03* 0.97 1.19*    Estimated Creatinine Clearance: 32.8 mL/min (A) (by C-G formula based on SCr of 1.19 mg/dL (H)).   Medical History: Past Medical History:  Diagnosis Date   Abnormality of lung on CXR 02/02/2020   Nonspecific finding on CXR ordered by pulmonologist - c/w inflammation vs infection, f/u imaging suggested, Dr. Antonette Batters office has been in communication about recommended next steps.   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   Non-bleeding when visualized on capsule endoscopy 06/30/2015    Arthritis    "lower back; hands" (02/19/2018)   Asthma    Asymptomatic cholelithiasis 09/25/2015   Seen on CT scan 08/2015   Carotid artery stenosis; s/p R endarterectomy    s/p right endarterectomy (06/2010) Carotid US  (07/2010):  Left:  Moderate-to-severe (60-79%) calcific and non-calcific plaque origin and proximal ICA and ECA    Chronic congestive heart failure with left ventricular diastolic dysfunction (HCC) 10/21/2010   Chronic constipation 02/03/2011   Chronic daily headache 01/16/2014   Chronic iron  deficiency anemia    Chronic low back pain 10/06/2012   Chronic venous insufficiency 08/04/2012   Closed fracture of one rib of left side 08/23/2021   ED visit after a fall 08/16/21    COPD (chronic obstructive pulmonary disease) with emphysema (HCC)    PFTs 2018: severe obstructive disease, insignif response to bronchiodilator, mild restriction parenchymal pattern, moderately severe diffusion defect. 2014  FEV1 0.92 (40%), ratio 69, 27% increase in FEV1 with BD, TLC 91%, severe airtrapping, DLCO49% On chronic home O2. Pulmonary rehab referral 05/2012    Dyspnea    Fibromyalgia 08/29/2010   Gastroesophageal reflux disease    History of blood transfusion    "several times"  (02/19/2018)   History of clear cell renal cell carcinoma (HCC), in remission 07/21/2011   s/p cryoablation of left RCC in 09/2011 by Dr. Nereida Banning. Followed by Dr. Joie Narrow  [facility]) .     History of fracture of left hip 10/17/2022   History of hiatal hernia    History of mitral valve replacement with bioprosthetic valve due to mitral stenosis 2012   s/p MVR with a 27-mm pericardial porcine valve (Medtronic Mosaic valve, serial #[id number] on 09/20/10, Dr. Matt Song)    History of obstructive sleep apnea, resolved 2013  resolved per sleep study 07/2019; no apnea, but did have desaturation.  CPAP no longer necessary.  Nocturnal polysomnography (06/2009): Moderate sleep apnea/ hypopnea syndrome , AHI 17.8 per hour with nonpositional hypopneas. CPAP titration to 12 CWP, AHI 2.4 per hour. On nocturnal CPAP via a small resMed Quattro full-face mask with heated humidifier.    History of pneumonia    "once"  (02/19/2018)   History of seborrheic keratosis  09/28/2015   History of stroke without residual deficits 01/17/2022   Hyperlipidemia LDL goal < 100 11/20/2005   Internal and external hemorrhoids without complication 08/04/2012   Lesion of left native kidney 06/01/2020   Incidental finding on recent screening chest CT for lung ca ordered by Dr. Linder Revere pulmonology - he has ordered a f/u renal U/S.    Lichen sclerosus of female genitalia 01/12/2017   Migraine    "none in years" (02/19/2018)   Mild cognitive impairment with memory loss 12/23/2021   Moderately severe major depression (HCC) 11/19/2005   Nocturnal hypoxia per sleep study 07/2019    Osteoporosis    DEXA 2016: T -2.7; DEXA (12/09/2011): L-spine T -3.7, left hip T -1.4 DEXA (12/2004): L-spine T -2.6, left hip -0.1    Paroxysmal atrial fibrillation (HCC) 10/22/2010   s/p Left atrial maze procedure for paroxysmal atrial fibrillation on 09/20/2010 by Dr Matt Song.  Subsequent splenic infarct, decision was made to re-anticoagulate with coumadin , likely life-long as this is the most likely cause of the splenic infarct.    Personal history of colonic polyps 05/14/2011   Colonoscopy (05/2011): 4 mm adenomatous polyp excised endoscopically Colonoscopy (02/2002): Adenomatous polyp excised endoscopically    Personal history of renal cell carcinoma 09/12/2020   Pneumonia    Pulmonary hypertension due to chronic obstructive pulmonary disease (HCC) 04/25/2016   2014 TEE w PA peak pressure 46 mmHg, s/p MV replacement    Right nephrolithiasis, asymptomatic, incidental finding 09/06/2014   5 mm non-obstructing calculus seen on CT scan 09/05/2014    Right ventricular failure (HCC) 04/25/2016   Severe obesity (BMI 35.0-39.9) with comorbidity (HCC) 10/23/2011   Sleep apnea    Stage 2 chronic kidney disease due to type 2 diabetes mellitus (HCC) 12/16/2018   CKD stage III     Tobacco abuse 07/28/2012   Type 2 diabetes mellitus with complication, with long-term current use of insulin  (HCC)    Type 2  diabetes mellitus with diabetic neuropathy (HCC)    Weight loss due to medication 03/12/2023    Medications:  Scheduled:   acetaminophen   1,000 mg Oral Once   ALPRAZolam   0.5 mg Oral QHS   budesonide -glycopyrrolate -formoterol   2 puff Inhalation BID   buPROPion   150 mg Oral BH-q7a   carvedilol   6.25 mg Oral BID WC   diclofenac  Sodium  4 g Topical TID AC & HS   furosemide   60 mg Oral Daily   gabapentin   600 mg Oral QHS   insulin  aspart  0-15 Units Subcutaneous TID WC   insulin  aspart  3 Units Subcutaneous TID WC   insulin  glargine-yfgn  36 Units Subcutaneous Daily   linaclotide   72 mcg Oral QAC breakfast   multivitamin with minerals  1 tablet Oral Daily   nicotine   14 mg Transdermal Daily   pantoprazole   40 mg Oral Daily   Ensure Max Protein  11 oz Oral Daily   ramelteon   8 mg Oral QHS   sacubitril -valsartan   1 tablet Oral BID   spironolactone   50 mg Oral Daily  venlafaxine  XR  150 mg Oral Q breakfast   Warfarin - Pharmacist Dosing Inpatient   Does not apply q1600    Assessment: 73YOF with PMH of R carotid artery stenosis s/p endarterectomy, s/p MVR w/ bioprosthetic MV (on warfarin PTA), HFpEF, pAF admitted with HFpEF exacerbation. Pharmacy consulted to dose warfarin.  PTA warfarin regimen: 1.25 mg Mon, 2.5 mg all other days per 5/6 anticoag visit  Previously receiving feeding supplement Ensure Max Protein which provides 20% of the recommended daily requirement of vitamin K1 . Anticipate warfarin dosing requirements are increased with addition of the supplement. Currently eating 50% of meals.  INR 1.9 today, just subtherapeutic for desired range. Last dose of warfarin received 5/17 was 5mg  x1. Hgb and plt stable, WNL. No signs/symptoms of bleeding noted. Of note, patient did experience a ground level fall overnight. No signs of overt bleeding noted but will continue to monitor.   Goal of Therapy:  INR target range as per patient's PCP, Dr. Concha Deed A. Broadus Canes has been set at 2.0  - 2.5.   Plan:  Warfarin 5mg  PO x1 tonight Daily INR Monitor CBC and signs/symptoms of bleeding Monitor changes in appetite and for new drug interactions   Jerrel Mor, PharmD PGY1 Pharmacy Resident 06/28/2023 8:20 AM

## 2023-06-28 NOTE — Progress Notes (Signed)
 Paged by RN after patient had a ground-level fall.  She was sitting at the edge of her bed and fell to the ground.  She did not hit her head.  She had minor abrasions of her right knee and right elbow that were subsequently covered.  On assessment, patient was resting comfortably.  No wounds or abrasions noted on her head.  She was alert and oriented x 4.  She had full range of motion of her joints.  She did endorse some soreness of her right shoulder but was able to fully move it with minimal pain.  No swelling noted on her joints.  Holding off on follow-up imaging at this time but would consider if she develops reduced range of motion or increased swelling.

## 2023-06-29 DIAGNOSIS — M25511 Pain in right shoulder: Secondary | ICD-10-CM

## 2023-06-29 DIAGNOSIS — W19XXXD Unspecified fall, subsequent encounter: Secondary | ICD-10-CM

## 2023-06-29 DIAGNOSIS — E119 Type 2 diabetes mellitus without complications: Secondary | ICD-10-CM

## 2023-06-29 DIAGNOSIS — J449 Chronic obstructive pulmonary disease, unspecified: Secondary | ICD-10-CM

## 2023-06-29 DIAGNOSIS — K5900 Constipation, unspecified: Secondary | ICD-10-CM

## 2023-06-29 DIAGNOSIS — I272 Pulmonary hypertension, unspecified: Secondary | ICD-10-CM | POA: Diagnosis not present

## 2023-06-29 DIAGNOSIS — I110 Hypertensive heart disease with heart failure: Secondary | ICD-10-CM | POA: Diagnosis not present

## 2023-06-29 DIAGNOSIS — I5033 Acute on chronic diastolic (congestive) heart failure: Secondary | ICD-10-CM | POA: Diagnosis not present

## 2023-06-29 DIAGNOSIS — F17210 Nicotine dependence, cigarettes, uncomplicated: Secondary | ICD-10-CM

## 2023-06-29 DIAGNOSIS — I480 Paroxysmal atrial fibrillation: Secondary | ICD-10-CM | POA: Diagnosis not present

## 2023-06-29 DIAGNOSIS — D509 Iron deficiency anemia, unspecified: Secondary | ICD-10-CM

## 2023-06-29 DIAGNOSIS — F32A Depression, unspecified: Secondary | ICD-10-CM

## 2023-06-29 LAB — BASIC METABOLIC PANEL WITH GFR
Anion gap: 6 (ref 5–15)
BUN: 33 mg/dL — ABNORMAL HIGH (ref 8–23)
CO2: 27 mmol/L (ref 22–32)
Calcium: 9.3 mg/dL (ref 8.9–10.3)
Chloride: 104 mmol/L (ref 98–111)
Creatinine, Ser: 0.88 mg/dL (ref 0.44–1.00)
GFR, Estimated: >60 mL/min (ref >60)
Glucose, Bld: 193 mg/dL — ABNORMAL HIGH (ref 70–99)
Potassium: 4.6 mmol/L (ref 3.5–5.1)
Sodium: 137 mmol/L (ref 135–145)

## 2023-06-29 LAB — PROTIME-INR
INR: 2.2 — ABNORMAL HIGH (ref 0.8–1.2)
Prothrombin Time: 24.4 s — ABNORMAL HIGH (ref 11.4–15.2)

## 2023-06-29 LAB — GLUCOSE, CAPILLARY
Glucose-Capillary: 141 mg/dL — ABNORMAL HIGH (ref 70–99)
Glucose-Capillary: 267 mg/dL — ABNORMAL HIGH (ref 70–99)
Glucose-Capillary: 106 mg/dL — ABNORMAL HIGH (ref 70–99)
Glucose-Capillary: 202 mg/dL — ABNORMAL HIGH (ref 70–99)

## 2023-06-29 MED ORDER — WARFARIN SODIUM 7.5 MG PO TABS
7.5000 mg | ORAL_TABLET | Freq: Once | ORAL | Status: AC
  Administered 2023-06-29: 7.5 mg via ORAL
  Filled 2023-06-29: qty 1

## 2023-06-29 MED ORDER — KETOROLAC TROMETHAMINE 15 MG/ML IJ SOLN
15.0000 mg | Freq: Once | INTRAMUSCULAR | Status: AC
  Administered 2023-06-29: 15 mg via INTRAMUSCULAR
  Filled 2023-06-29: qty 1

## 2023-06-29 MED ORDER — FUROSEMIDE 40 MG PO TABS
40.0000 mg | ORAL_TABLET | Freq: Every day | ORAL | Status: DC
Start: 2023-06-29 — End: 2023-07-01
  Administered 2023-06-29 – 2023-07-01 (×3): 40 mg via ORAL
  Filled 2023-06-29 (×3): qty 1

## 2023-06-29 NOTE — Progress Notes (Addendum)
 PHARMACY - ANTICOAGULATION CONSULT NOTE  Pharmacy Consult for warfarin management. Indication: atrial fibrillation  Allergies  Allergen Reactions   Dexilant [Dexlansoprazole] Other (See Comments)    Made acid reflux worse   Metoclopramide      Other Reaction(s): unknown   Isovue  [Iopamidol ] Itching and Rash   Lorazepam Other (See Comments)    Patient's sister noted that ativan caused the patient to become extremely confused during hospitalization 09/2010; tolerates Xanax    Oxycontin  [Oxycodone ] Other (See Comments)    headache   Tramadol Hcl Swelling    Ankle swelling    Patient Measurements: Height: 5\' 2"  (157.5 cm) Weight: 59.2 kg (130 lb 9.6 oz) IBW/kg (Calculated) : 50.1 HEPARIN  DW (KG): 63.3  Vital Signs: Temp: 97.8 F (36.6 C) (05/19 0506) Temp Source: Oral (05/19 0506) BP: 146/53 (05/19 0506) Pulse Rate: 71 (05/19 0506)  Labs: Recent Labs    06/27/23 0227 06/28/23 0246 06/29/23 0247  HGB 11.9*  --   --   HCT 39.7  --   --   PLT 274  --   --   LABPROT 22.7* 22.0* 24.4*  INR 2.0* 1.9* 2.2*  CREATININE 0.97 1.19* 0.88    Estimated Creatinine Clearance: 44.4 mL/min (by C-G formula based on SCr of 0.88 mg/dL).   Medical History: Past Medical History:  Diagnosis Date   Abnormality of lung on CXR 02/02/2020   Nonspecific finding on CXR ordered by pulmonologist - c/w inflammation vs infection, f/u imaging suggested, Dr. Antonette Batters office has been in communication about recommended next steps.   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   Non-bleeding when visualized on capsule endoscopy 06/30/2015    Arthritis    "lower back; hands" (02/19/2018)   Asthma    Asymptomatic cholelithiasis 09/25/2015   Seen on CT scan 08/2015   Carotid artery stenosis; s/p R endarterectomy    s/p right endarterectomy (06/2010) Carotid US  (07/2010):  Left:  Moderate-to-severe (60-79%) calcific and non-calcific plaque origin and proximal ICA and ECA    Chronic congestive heart failure with left ventricular diastolic dysfunction (HCC) 10/21/2010   Chronic constipation 02/03/2011   Chronic daily headache 01/16/2014   Chronic iron  deficiency anemia    Chronic low back pain 10/06/2012   Chronic venous insufficiency 08/04/2012   Closed fracture of one rib of left side 08/23/2021   ED visit after a fall 08/16/21    COPD (chronic obstructive pulmonary disease) with emphysema (HCC)    PFTs 2018: severe obstructive disease, insignif response to bronchiodilator, mild restriction parenchymal pattern, moderately severe diffusion defect. 2014  FEV1 0.92 (40%), ratio 69, 27% increase in FEV1 with BD, TLC 91%, severe airtrapping, DLCO49% On chronic home O2. Pulmonary rehab referral 05/2012    Dyspnea    Fibromyalgia 08/29/2010   Gastroesophageal reflux disease    History of blood transfusion    "several times"  (02/19/2018)   History of clear cell renal cell carcinoma (HCC), in remission 07/21/2011   s/p cryoablation of left RCC in 09/2011 by Dr. Nereida Banning. Followed by Dr. Joie Narrow  [facility]) .     History of fracture of left hip 10/17/2022   History of hiatal hernia    History of mitral valve replacement with bioprosthetic valve due to mitral stenosis 2012   s/p MVR with a 27-mm pericardial porcine valve (Medtronic Mosaic valve, serial #[id number] on 09/20/10, Dr. Matt Song)    History of obstructive  sleep apnea, resolved 2013   resolved per sleep study 07/2019; no apnea, but did have desaturation.  CPAP no longer necessary.  Nocturnal polysomnography (06/2009): Moderate sleep apnea/ hypopnea syndrome , AHI 17.8 per hour with nonpositional hypopneas. CPAP titration to 12 CWP, AHI 2.4 per hour. On nocturnal CPAP via a small resMed Quattro full-face mask with heated humidifier.    History of pneumonia    "once"  (02/19/2018)   History of seborrheic keratosis  09/28/2015   History of stroke without residual deficits 01/17/2022   Hyperlipidemia LDL goal < 100 11/20/2005   Internal and external hemorrhoids without complication 08/04/2012   Lesion of left native kidney 06/01/2020   Incidental finding on recent screening chest CT for lung ca ordered by Dr. Linder Revere pulmonology - he has ordered a f/u renal U/S.    Lichen sclerosus of female genitalia 01/12/2017   Migraine    "none in years" (02/19/2018)   Mild cognitive impairment with memory loss 12/23/2021   Moderately severe major depression (HCC) 11/19/2005   Nocturnal hypoxia per sleep study 07/2019    Osteoporosis    DEXA 2016: T -2.7; DEXA (12/09/2011): L-spine T -3.7, left hip T -1.4 DEXA (12/2004): L-spine T -2.6, left hip -0.1    Paroxysmal atrial fibrillation (HCC) 10/22/2010   s/p Left atrial maze procedure for paroxysmal atrial fibrillation on 09/20/2010 by Dr Matt Song.  Subsequent splenic infarct, decision was made to re-anticoagulate with coumadin , likely life-long as this is the most likely cause of the splenic infarct.    Personal history of colonic polyps 05/14/2011   Colonoscopy (05/2011): 4 mm adenomatous polyp excised endoscopically Colonoscopy (02/2002): Adenomatous polyp excised endoscopically    Personal history of renal cell carcinoma 09/12/2020   Pneumonia    Pulmonary hypertension due to chronic obstructive pulmonary disease (HCC) 04/25/2016   2014 TEE w PA peak pressure 46 mmHg, s/p MV replacement    Right nephrolithiasis, asymptomatic, incidental finding 09/06/2014   5 mm non-obstructing calculus seen on CT scan 09/05/2014    Right ventricular failure (HCC) 04/25/2016   Severe obesity (BMI 35.0-39.9) with comorbidity (HCC) 10/23/2011   Sleep apnea    Stage 2 chronic kidney disease due to type 2 diabetes mellitus (HCC) 12/16/2018   CKD stage III     Tobacco abuse 07/28/2012   Type 2 diabetes mellitus with complication, with long-term current use of insulin  (HCC)    Type 2  diabetes mellitus with diabetic neuropathy (HCC)    Weight loss due to medication 03/12/2023    Medications:  Scheduled:   acetaminophen   1,000 mg Oral Once   ALPRAZolam   0.5 mg Oral QHS   budesonide -glycopyrrolate -formoterol   2 puff Inhalation BID   buPROPion   150 mg Oral BH-q7a   carvedilol   6.25 mg Oral BID WC   diclofenac  Sodium  4 g Topical TID AC & HS   furosemide   40 mg Oral Daily   gabapentin   600 mg Oral QHS   insulin  aspart  0-15 Units Subcutaneous TID WC   insulin  aspart  0-5 Units Subcutaneous QHS   insulin  aspart  4 Units Subcutaneous TID WC   insulin  glargine-yfgn  40 Units Subcutaneous Daily   lidocaine   1 patch Transdermal Q24H   linaclotide   72 mcg Oral QAC breakfast   multivitamin with minerals  1 tablet Oral Daily   nicotine   14 mg Transdermal Daily   pantoprazole   40 mg Oral Daily   Ensure Max Protein  11 oz Oral Daily   ramelteon   8 mg Oral QHS   sacubitril -valsartan   1 tablet Oral BID   spironolactone   50 mg Oral Daily   venlafaxine  XR  150 mg Oral Q breakfast   Warfarin - Pharmacist Dosing Inpatient   Does not apply q1600    Assessment: INR 2.2 at 0247h. Goal of Therapy:  INR target range 2.0 - 2.5, as per PCP, Dr. Concha Deed A. Williams.   Plan:  Warfarin, 7.5 mg PO today at 1600h. Continue to follow INR for dose responsiveness and surveillance for signs and symptoms of bleeding or embolic event.  Kenda Paula, PharmD, CPP Clinical Pharmacist Practitioner 06/29/2023,7:03 AM

## 2023-06-29 NOTE — Progress Notes (Signed)
 Subjective Shoulder pain continues. A little worse than yesterday. No SOB. No falls since yesterday.  Physical exam Blood pressure (!) 121/53, pulse (!) 55, temperature 97.8 F (36.6 C), temperature source Oral, resp. rate 14, height 5\' 2"  (1.575 m), weight 59.2 kg, SpO2 99%.  Constitutional: sitting up in bed, in no acute distress Cardiovascular: regular rate and rhythm, no m/r/g, no LEE Pulmonary/Chest: normal work of breathing on RA, coarse sounds bilaterally MSK: right arm/shoulder without overt deformity, 2 cm ecchymosis ~ AC joint, able to left to head level but limited by pain  Neurological: alert & oriented  Weight change: -0.091 kg   Intake/Output Summary (Last 24 hours) at 06/29/2023 1050 Last data filed at 06/29/2023 0850 Gross per 24 hour  Intake 597 ml  Output 600 ml  Net -3 ml   Net IO Since Admission: -8,150.56 mL [06/29/23 1050]  Labs, images, and other studies    Latest Ref Rng & Units 06/27/2023    2:27 AM 06/26/2023    5:26 AM 06/25/2023    2:51 AM  CBC  WBC 4.0 - 10.5 K/uL 9.2  8.8  15.8   Hemoglobin 12.0 - 15.0 g/dL 57.8  46.9  62.9   Hematocrit 36.0 - 46.0 % 39.7  44.0  42.6   Platelets 150 - 400 K/uL 274  285  293        Latest Ref Rng & Units 06/29/2023    2:47 AM 06/28/2023    2:46 AM 06/27/2023    2:27 AM  BMP  Glucose 70 - 99 mg/dL 528  413  244   BUN 8 - 23 mg/dL 33  52  51   Creatinine 0.44 - 1.00 mg/dL 0.10  2.72  5.36   Sodium 135 - 145 mmol/L 137  135  135   Potassium 3.5 - 5.1 mmol/L 4.6  4.9  4.2   Chloride 98 - 111 mmol/L 104  99  102   CO2 22 - 32 mmol/L 27  29  25    Calcium  8.9 - 10.3 mg/dL 9.3  64.4  9.3      Assessment and plan Hospital day 11  [name] is a 73 y.o.female pertinent PMH of R carotid artery stenosis s/p endarterectomy, s/p MVR, HFpEF, pAF on warfarin, tobacco use disorder, T2DM, and COPD/emphysema on 2 L [place] at bedtime, who presented from the [facility] with bilateral lower extremity edema  and admitted for HFpEF exacerbation.    Acute on chronic HFpEF Pulmonary hypertension Euvolemic on exam. Vitals/labs look good. -Resume Lasix  at 40 mg po/daily -Repeat orthostatics -Cardiology d/c Losartan  and started Entresto  24-26 BID 5/16 -BP stable so will continue Coreg  at lower dose, now 6.25 BID -Continue Aldactone  50. Cardiology recommend starting Jardiance  outpatient after repeat labs. - Strict I&Os, daily weights - Daily BMP, Mg   Paroxysmal afib S/P mitral valve repair Hypertension Stable. Meds per above. Warfarin per pharmacy for MV stenosis s/p bioprothetic valve (2012). INR is currently at goal.   Right Shoulder Pain from Fall 5/18 Seems mechanical in nature. Given right arm/shoulder pain, shoulder X-ray ordered which was unremarkable. Potentially rotator cuff pathology but will defer MRI for now as management would likely be unchanged. Will give Toradol  IM once today and continue to manage with Voltaren  gel in addition to home analgesics for now.   PAD ABI's from 05/2023 show R>L PAD. LE exam consistent with ischemic rubor though  this seems to be improving. LDL is 91. Not a candidate for ASA while on Warfarin. Will continue to encourage smoking cessation as this appears to be only modifiable risk factor presently.    COPD Tobacco Use Disorder On Breztri  here (Trelegy at home) and DuoNebs prn. - Maintain SpO2 between 88-92% - Incentive spirometry  - Consider pulmonary rehab outpatient - Nicotine  patches and continued counseling on smoking cessation   T2DM A1c on admission is 5.9. CBG's generally within goal but fasting elevated. -Increase Semglee  to 40 units daily, 4 units with meals, + SSI  Depression - Continue venlafaxine , wellbutrin , ramelteon , and xanax    Iron  deficiency anemia Stable during admission so stopped checking CBC 5/17 . Has been receiving IV iron  as an outpatient with last infusion on 4/17.    Chronic pain Neuralgia -Continue home Norco 5-325  two tablets q6h PRN and gabapentin  600 mg at bedtime.    Constipation - Continue linzess  - Miralax  daily PRN   Disposition TOC following for SNF placement which has been approved, bed pending.  Ronni Colace, DO 06/29/2023, 10:50 AM  Pager: [phone] After 5pm or weekend: [phone]

## 2023-06-29 NOTE — Progress Notes (Signed)
 Patient intermittently refuses bed alarms. Patient educated and verbally understands.

## 2023-06-29 NOTE — Care Management Important Message (Signed)
 Important Message  Patient Details  Name: [name] MRN: [id number] Date of Birth: [birth year]   Important Message Given:  Yes - Medicare IM     Wynonia Hedges 06/29/2023, 4:01 PM

## 2023-06-29 NOTE — TOC Progression Note (Addendum)
 Transition of Care [facility]) - Progression Note    Patient Details  Name: [name] MRN: [id number] Date of Birth: Dec 05, 1950  [facility] of [facility] CM/SW Contact  Arron Big, [place] Phone Number: 06/29/2023, 10:54 AM  Clinical Narrative:   [name] still does not have bed availability. Per admissions, they will need have a bed available until Wednesday.   CSW spoke with patient about potentially picking another facility being that [name] does not have a bed. Patient stated she only wants to go to [name] as she has been to another SNF before and did not like the care she received.   CSW spoke with [facility] to confirm when patients [name] will expire. Per [facility], [name] is good for 5 business days and was approved 5/16-5/22. Hospitalist has been notified/updated.   10:58 AM CSW called [name], patient sis, to update her on current situation. [name] had no questions at this time. [name] will provide transport to SNF.   TOC will continue to follow.    Expected Discharge Plan: Skilled Nursing Facility Barriers to Discharge: Other (must enter comment) (Patient only wants to go to [place], [place] will not have bed availability until Wednesday 5/21)  Expected Discharge Plan and Services     Post Acute Care Choice: Skilled Nursing Facility Living arrangements for the past 2 months: Single Family Home                                       Social Determinants of Health (SDOH) Interventions SDOH Screenings   Food Insecurity: No Food Insecurity (06/18/2023)  Recent Concern: Food Insecurity - Food Insecurity Present (05/25/2023)  Housing: Low Risk  (06/18/2023)  Transportation Needs: No Transportation Needs (06/18/2023)  Recent Concern: Transportation Needs - Unmet Transportation Needs (04/08/2023)  Utilities: Not At Risk (06/18/2023)  Alcohol Screen: Low Risk  (04/08/2023)  Depression (PHQ2-9): High Risk (04/08/2023)  Financial Resource Strain: Low Risk  (04/08/2023)  Physical  Activity: Inactive (04/08/2023)  Social Connections: Socially Isolated (06/18/2023)  Stress: Stress Concern Present (04/08/2023)  Tobacco Use: High Risk (06/18/2023)  Health Literacy: Adequate Health Literacy (04/08/2023)    Readmission Risk Interventions     No data to display

## 2023-06-29 NOTE — Plan of Care (Signed)

## 2023-06-29 NOTE — Progress Notes (Signed)
 Mobility Specialist Progress Note:    06/29/23 1600  Mobility  Activity Ambulated with assistance in hallway  Level of Assistance Contact guard assist, steadying assist  Assistive Device Front wheel walker  Distance Ambulated (ft) 200 ft  Activity Response Tolerated well  Mobility Referral Yes  Mobility visit 1 Mobility  Mobility Specialist Start Time (ACUTE ONLY) 1600  Mobility Specialist Stop Time (ACUTE ONLY) 1615  Mobility Specialist Time Calculation (min) (ACUTE ONLY) 15 min    Post Mobility: SpO2 94% RA  Pt agreeable to mobility session. Ambulated to BR prior to hallway. Required up to CGA to steady throughout session. R knee appeared to slightly buckle occasionally which is baseline per pt. No c/o throughout, R shoulder improved. Back sitting EOB with all needs met, alarm on.  Oneda Big Mobility Specialist Please contact via SecureChat or  Rehab office at [phone]

## 2023-06-29 NOTE — Plan of Care (Signed)
  Problem: Health Behavior/Discharge Planning: Goal: Ability to manage health-related needs will improve Outcome: Progressing   Problem: Clinical Measurements: Goal: Ability to maintain clinical measurements within normal limits will improve Outcome: Progressing Goal: Diagnostic test results will improve Outcome: Progressing Goal: Cardiovascular complication will be avoided Outcome: Progressing   Problem: Nutrition: Goal: Adequate nutrition will be maintained Outcome: Progressing   Problem: Coping: Goal: Level of anxiety will decrease Outcome: Progressing

## 2023-06-29 NOTE — Progress Notes (Signed)
 Mobility Specialist Progress Note:   06/29/23 1000  Mobility  Activity Stood at bedside (+ march in place)  Level of Assistance Minimal assist, patient does 75% or more  Assistive Device Other (Comment) (HHA)  Activity Response Tolerated fair  Mobility Referral Yes  Mobility visit 1 Mobility  Mobility Specialist Start Time (ACUTE ONLY) 1000  Mobility Specialist Stop Time (ACUTE ONLY) 1020  Mobility Specialist Time Calculation (min) (ACUTE ONLY) 20 min   Pt agreeable to mobility session. Required minA to stand from EOB and march in place. Still c/o R shoulder pain from fall yesterday, with small bruise. Pt abruptly sat back down on bed saying "See, I told you I can't do it". Performed multiple EOB level exercises. Left sitting with all needs met, alarm on.   Oneda Big Mobility Specialist Please contact via SecureChat or  Rehab office at [phone]

## 2023-06-30 ENCOUNTER — Encounter: Admitting: Internal Medicine

## 2023-06-30 DIAGNOSIS — I110 Hypertensive heart disease with heart failure: Secondary | ICD-10-CM | POA: Diagnosis not present

## 2023-06-30 DIAGNOSIS — I480 Paroxysmal atrial fibrillation: Secondary | ICD-10-CM | POA: Diagnosis not present

## 2023-06-30 DIAGNOSIS — I5033 Acute on chronic diastolic (congestive) heart failure: Secondary | ICD-10-CM | POA: Diagnosis not present

## 2023-06-30 DIAGNOSIS — I272 Pulmonary hypertension, unspecified: Secondary | ICD-10-CM | POA: Diagnosis not present

## 2023-06-30 LAB — GLUCOSE, CAPILLARY
Glucose-Capillary: 138 mg/dL — ABNORMAL HIGH (ref 70–99)
Glucose-Capillary: 185 mg/dL — ABNORMAL HIGH (ref 70–99)
Glucose-Capillary: 103 mg/dL — ABNORMAL HIGH (ref 70–99)

## 2023-06-30 LAB — BASIC METABOLIC PANEL WITH GFR
Anion gap: 6 (ref 5–15)
BUN: 34 mg/dL — ABNORMAL HIGH (ref 8–23)
CO2: 24 mmol/L (ref 22–32)
Calcium: 9.7 mg/dL (ref 8.9–10.3)
Chloride: 104 mmol/L (ref 98–111)
Creatinine, Ser: 0.97 mg/dL (ref 0.44–1.00)
GFR, Estimated: >60 mL/min (ref >60)
Glucose, Bld: 204 mg/dL — ABNORMAL HIGH (ref 70–99)
Potassium: 5.1 mmol/L (ref 3.5–5.1)
Sodium: 134 mmol/L — ABNORMAL LOW (ref 135–145)

## 2023-06-30 LAB — PROTIME-INR
INR: 2.6 — ABNORMAL HIGH (ref 0.8–1.2)
Prothrombin Time: 27.8 s — ABNORMAL HIGH (ref 11.4–15.2)

## 2023-06-30 MED ORDER — WARFARIN SODIUM 5 MG PO TABS
5.0000 mg | ORAL_TABLET | Freq: Once | ORAL | Status: AC
  Administered 2023-06-30: 5 mg via ORAL
  Filled 2023-06-30: qty 1

## 2023-06-30 NOTE — Plan of Care (Signed)
 Alert and oriented.  Ambulatory to bathroom with rolling walker.  Medicated for pain with PRN medications, see MAR for details.   Problem: Education: Goal: Knowledge of General Education information will improve Description: Including pain rating scale, medication(s)/side effects and non-pharmacologic comfort measures Outcome: Progressing   Problem: Health Behavior/Discharge Planning: Goal: Ability to manage health-related needs will improve Outcome: Progressing   Problem: Clinical Measurements: Goal: Ability to maintain clinical measurements within normal limits will improve Outcome: Progressing Goal: Will remain free from infection Outcome: Progressing

## 2023-06-30 NOTE — Progress Notes (Signed)
 Subjective Shoulder bothersome. Denies SOB/falls.  Physical exam Blood pressure (!) 132/117, pulse 86, temperature 97.7 F (36.5 C), temperature source Oral, resp. rate 19, height 5\' 2"  (1.575 m), weight 60.2 kg, SpO2 98%.  Constitutional: sitting up in bed, in no acute distress Cardiovascular: regular rate and rhythm, no m/r/g, no LEE Pulmonary/Chest: normal work of breathing on RA, coarse sounds bilaterally MSK: right arm/shoulder without overt deformity, 2 cm ecchymosis ~ AC joint, able to left to head level but limited by pain  Neurological: alert & oriented  Weight change: 1.459 kg   Intake/Output Summary (Last 24 hours) at 06/30/2023 1356 Last data filed at 06/30/2023 1300 Gross per 24 hour  Intake 480 ml  Output 300 ml  Net 180 ml   Net IO Since Admission: -7,730.56 mL [06/30/23 1356]  Labs, images, and other studies    Latest Ref Rng & Units 06/27/2023    2:27 AM 06/26/2023    5:26 AM 06/25/2023    2:51 AM  CBC  WBC 4.0 - 10.5 K/uL 9.2  8.8  15.8   Hemoglobin 12.0 - 15.0 g/dL 16.1  09.6  04.5   Hematocrit 36.0 - 46.0 % 39.7  44.0  42.6   Platelets 150 - 400 K/uL 274  285  293        Latest Ref Rng & Units 06/30/2023    3:32 AM 06/29/2023    2:47 AM 06/28/2023    2:46 AM  BMP  Glucose 70 - 99 mg/dL 409  811  914   BUN 8 - 23 mg/dL 34  33  52   Creatinine 0.44 - 1.00 mg/dL 7.82  9.56  2.13   Sodium 135 - 145 mmol/L 134  137  135   Potassium 3.5 - 5.1 mmol/L 5.1  4.6  4.9   Chloride 98 - 111 mmol/L 104  104  99   CO2 22 - 32 mmol/L 24  27  29    Calcium  8.9 - 10.3 mg/dL 9.7  9.3  08.6      Assessment and plan Hospital day 12  [name] is a 73 y.o.female pertinent PMH of R carotid artery stenosis s/p endarterectomy, s/p MVR, HFpEF, pAF on warfarin, tobacco use disorder, T2DM, and COPD/emphysema on [address] at bedtime, who presented from the [facility] with bilateral lower extremity edema and admitted for HFpEF exacerbation.    Acute on  chronic HFpEF Pulmonary hypertension Euvolemic on exam. Vitals/labs look good. -Resume Lasix  at 40 mg po/daily -Cardiology d/c Losartan  and started Entresto  24-26 BID 5/16 -BP stable so will continue Coreg  at lower dose, now 6.25 BID -Continue Aldactone  50. Cardiology recommend starting Jardiance  outpatient after repeat labs. - Strict I&Os, daily weights    Paroxysmal afib S/P mitral valve repair Hypertension Stable. Meds per above. Warfarin per pharmacy for MV stenosis s/p bioprothetic valve (2012). INR is currently at goal.   Right Shoulder Pain from Fall 5/18 Seems mechanical in nature. Given right arm/shoulder pain, shoulder X-ray ordered which was unremarkable. Potentially rotator cuff pathology but will defer MRI for now as management would likely be unchanged.  -Voltaren  gel in addition to home Norco   PAD ABI's from 05/2023 show R>L PAD. LE exam consistent with ischemic rubor though this seems to be improving. LDL is 91. Not a candidate for ASA while on Warfarin. Will continue to encourage smoking cessation as this appears to be only  modifiable risk factor presently.    COPD Tobacco Use Disorder On Breztri  here (Trelegy at home) and DuoNebs prn. - Maintain SpO2 between 88-92% - Incentive spirometry  - Consider pulmonary rehab outpatient - Nicotine  patches and continued counseling on smoking cessation   T2DM A1c on admission is 5.9. CBG's generally within goal but fasting elevated. -Semglee  40 units daily, 4 units with meals, + SSI   Depression - Continue venlafaxine , wellbutrin , ramelteon , and xanax    Iron  deficiency anemia Stable during admission so stopped checking CBC 5/17 . Has been receiving IV iron  as an outpatient with last infusion on 4/17.    Chronic pain Neuralgia -Continue home Norco 5-325 two tablets q6h PRN and gabapentin  600 mg at bedtime.    Constipation - Continue linzess  - Miralax  daily PRN   Disposition TOC following for SNF placement which  has been approved, anticipate discharge to [facility] tomorrow.   Ronni Colace, DO 06/30/2023, 1:56 PM  Pager: [phone] After 5pm or weekend: [phone]

## 2023-06-30 NOTE — Plan of Care (Addendum)
 Pt Aox4, VSS on 2L [place] PRN. Pt c/o severe pain right shoulder throughout shift requiring one time toradol . Tolerating diet. Voiding w/o difficulty, pt reports gas but no BM this shift. Skin warm, dry, intact, scattered bruising. BLE red, cool, no edema. Pt assisted to [facility] w/ SBA. Safety precautions in place, call light within reach.   Problem: Education: Goal: Knowledge of General Education information will improve Description: Including pain rating scale, medication(s)/side effects and non-pharmacologic comfort measures Outcome: Progressing   Problem: Health Behavior/Discharge Planning: Goal: Ability to manage health-related needs will improve Outcome: Progressing   Problem: Clinical Measurements: Goal: Ability to maintain clinical measurements within normal limits will improve Outcome: Progressing Goal: Will remain free from infection Outcome: Progressing Goal: Diagnostic test results will improve Outcome: Progressing Goal: Respiratory complications will improve Outcome: Progressing Goal: Cardiovascular complication will be avoided Outcome: Progressing   Problem: Activity: Goal: Risk for activity intolerance will decrease Outcome: Progressing   Problem: Nutrition: Goal: Adequate nutrition will be maintained Outcome: Progressing   Problem: Coping: Goal: Level of anxiety will decrease Outcome: Progressing   Problem: Elimination: Goal: Will not experience complications related to bowel motility Outcome: Progressing Goal: Will not experience complications related to urinary retention Outcome: Progressing   Problem: Pain Managment: Goal: General experience of comfort will improve and/or be controlled Outcome: Progressing   Problem: Safety: Goal: Ability to remain free from injury will improve Outcome: Progressing   Problem: Skin Integrity: Goal: Risk for impaired skin integrity will decrease Outcome: Progressing   Problem: Education: Goal: Ability to describe  self-care measures that may prevent or decrease complications (Diabetes Survival Skills Education) will improve Outcome: Progressing Goal: Individualized Educational Video(s) Outcome: Progressing   Problem: Coping: Goal: Ability to adjust to condition or change in health will improve Outcome: Progressing   Problem: Fluid Volume: Goal: Ability to maintain a balanced intake and output will improve Outcome: Progressing   Problem: Health Behavior/Discharge Planning: Goal: Ability to identify and utilize available resources and services will improve Outcome: Progressing Goal: Ability to manage health-related needs will improve Outcome: Progressing   Problem: Metabolic: Goal: Ability to maintain appropriate glucose levels will improve Outcome: Progressing   Problem: Nutritional: Goal: Maintenance of adequate nutrition will improve Outcome: Progressing Goal: Progress toward achieving an optimal weight will improve Outcome: Progressing   Problem: Skin Integrity: Goal: Risk for impaired skin integrity will decrease Outcome: Progressing   Problem: Tissue Perfusion: Goal: Adequacy of tissue perfusion will improve Outcome: Progressing

## 2023-06-30 NOTE — Progress Notes (Signed)
 PHARMACY - ANTICOAGULATION CONSULT NOTE  Pharmacy Consult for warfarin management. Indication: atrial fibrillation  Allergies  Allergen Reactions   Dexilant [Dexlansoprazole] Other (See Comments)    Made acid reflux worse   Metoclopramide      Other Reaction(s): unknown   Isovue  [Iopamidol ] Itching and Rash   Lorazepam Other (See Comments)    Patient's sister noted that ativan caused the patient to become extremely confused during hospitalization 09/2010; tolerates Xanax    Oxycontin  [Oxycodone ] Other (See Comments)    headache   Tramadol Hcl Swelling    Ankle swelling    Patient Measurements: Height: 5\' 2"  (157.5 cm) Weight: 60.2 kg (132 lb 11.5 oz) IBW/kg (Calculated) : 50.1 HEPARIN  DW (KG): 63.3  Vital Signs: Temp: 97.6 F (36.4 C) (05/20 0547) Temp Source: Oral (05/20 0547) BP: 156/54 (05/20 0547) Pulse Rate: 72 (05/20 0547)  Labs: Recent Labs    06/28/23 0246 06/29/23 0247 06/30/23 0332  LABPROT 22.0* 24.4* 27.8*  INR 1.9* 2.2* 2.6*  CREATININE 1.19* 0.88 0.97    Estimated Creatinine Clearance: 43.5 mL/min (by C-G formula based on SCr of 0.97 mg/dL).   Medical History: Past Medical History:  Diagnosis Date   Abnormality of lung on CXR 02/02/2020   Nonspecific finding on CXR ordered by pulmonologist - c/w inflammation vs infection, f/u imaging suggested, Dr. Antonette Batters office has been in communication about recommended next steps.   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   Non-bleeding when visualized on capsule endoscopy 06/30/2015    Arthritis    "lower back; hands" (02/19/2018)   Asthma    Asymptomatic cholelithiasis 09/25/2015   Seen on CT scan 08/2015   Carotid artery stenosis; s/p R endarterectomy    s/p right endarterectomy (06/2010) Carotid US  (07/2010):  Left: Moderate-to-severe (60-79%) calcific and non-calcific plaque  origin and proximal ICA and ECA    Chronic congestive heart failure with left ventricular diastolic dysfunction (HCC) 10/21/2010   Chronic constipation 02/03/2011   Chronic daily headache 01/16/2014   Chronic iron  deficiency anemia    Chronic low back pain 10/06/2012   Chronic venous insufficiency 08/04/2012   Closed fracture of one rib of left side 08/23/2021   ED visit after a fall 08/16/21    COPD (chronic obstructive pulmonary disease) with emphysema (HCC)    PFTs 2018: severe obstructive disease, insignif response to bronchiodilator, mild restriction parenchymal pattern, moderately severe diffusion defect. 2014  FEV1 0.92 (40%), ratio 69, 27% increase in FEV1 with BD, TLC 91%, severe airtrapping, DLCO49% On chronic home O2. Pulmonary rehab referral 05/2012    Dyspnea    Fibromyalgia 08/29/2010   Gastroesophageal reflux disease    History of blood transfusion    "several times"  (02/19/2018)   History of clear cell renal cell carcinoma (HCC), in remission 07/21/2011   s/p cryoablation of left RCC in 09/2011 by Dr. Nereida Banning. Followed by Dr. Joie Narrow  [facility]) .     History of fracture of left hip 10/17/2022   History of hiatal hernia    History of mitral valve replacement with bioprosthetic valve due to mitral stenosis 2012   s/p MVR with a 27-mm pericardial porcine valve (Medtronic Mosaic valve, serial #[id number] on 09/20/10, Dr. Matt Song)    History of obstructive sleep apnea, resolved 2013   resolved per sleep study 07/2019; no apnea, but did have desaturation.  CPAP no longer necessary.  Nocturnal polysomnography (06/2009): Moderate  sleep apnea/ hypopnea syndrome , AHI 17.8 per hour with nonpositional hypopneas. CPAP titration to 12 CWP, AHI 2.4 per hour. On nocturnal CPAP via a small resMed Quattro full-face mask with heated humidifier.    History of pneumonia    "once"  (02/19/2018)   History of seborrheic keratosis 09/28/2015   History of stroke without residual deficits  01/17/2022   Hyperlipidemia LDL goal < 100 11/20/2005   Internal and external hemorrhoids without complication 08/04/2012   Lesion of left native kidney 06/01/2020   Incidental finding on recent screening chest CT for lung ca ordered by Dr. Linder Revere pulmonology - he has ordered a f/u renal U/S.    Lichen sclerosus of female genitalia 01/12/2017   Migraine    "none in years" (02/19/2018)   Mild cognitive impairment with memory loss 12/23/2021   Moderately severe major depression (HCC) 11/19/2005   Nocturnal hypoxia per sleep study 07/2019    Osteoporosis    DEXA 2016: T -2.7; DEXA (12/09/2011): L-spine T -3.7, left hip T -1.4 DEXA (12/2004): L-spine T -2.6, left hip -0.1    Paroxysmal atrial fibrillation (HCC) 10/22/2010   s/p Left atrial maze procedure for paroxysmal atrial fibrillation on 09/20/2010 by Dr Matt Song.  Subsequent splenic infarct, decision was made to re-anticoagulate with coumadin , likely life-long as this is the most likely cause of the splenic infarct.    Personal history of colonic polyps 05/14/2011   Colonoscopy (05/2011): 4 mm adenomatous polyp excised endoscopically Colonoscopy (02/2002): Adenomatous polyp excised endoscopically    Personal history of renal cell carcinoma 09/12/2020   Pneumonia    Pulmonary hypertension due to chronic obstructive pulmonary disease (HCC) 04/25/2016   2014 TEE w PA peak pressure 46 mmHg, s/p MV replacement    Right nephrolithiasis, asymptomatic, incidental finding 09/06/2014   5 mm non-obstructing calculus seen on CT scan 09/05/2014    Right ventricular failure (HCC) 04/25/2016   Severe obesity (BMI 35.0-39.9) with comorbidity (HCC) 10/23/2011   Sleep apnea    Stage 2 chronic kidney disease due to type 2 diabetes mellitus (HCC) 12/16/2018   CKD stage III     Tobacco abuse 07/28/2012   Type 2 diabetes mellitus with complication, with long-term current use of insulin  (HCC)    Type 2 diabetes mellitus with diabetic neuropathy (HCC)    Weight  loss due to medication 03/12/2023    Medications:  Scheduled:   acetaminophen   1,000 mg Oral Once   ALPRAZolam   0.5 mg Oral QHS   budesonide -glycopyrrolate -formoterol   2 puff Inhalation BID   buPROPion   150 mg Oral BH-q7a   carvedilol   6.25 mg Oral BID WC   diclofenac  Sodium  4 g Topical TID AC & HS   furosemide   40 mg Oral Daily   gabapentin   600 mg Oral QHS   insulin  aspart  0-15 Units Subcutaneous TID WC   insulin  aspart  0-5 Units Subcutaneous QHS   insulin  aspart  4 Units Subcutaneous TID WC   insulin  glargine-yfgn  40 Units Subcutaneous Daily   lidocaine   1 patch Transdermal Q24H   linaclotide   72 mcg Oral QAC breakfast   multivitamin with minerals  1 tablet Oral Daily   nicotine   14 mg Transdermal Daily   pantoprazole   40 mg Oral Daily   Ensure Max Protein  11 oz Oral Daily   ramelteon   8 mg Oral QHS   sacubitril -valsartan   1 tablet Oral BID   spironolactone   50 mg Oral Daily   venlafaxine  XR  150  mg Oral Q breakfast   Warfarin - Pharmacist Dosing Inpatient   Does not apply q1600    Assessment: INR 2.6 at 0232 hours. Goal of Therapy:  2.0 - 2.5 as per the patient's PCP, Dr. Concha Deed A. Williams.    Plan:  Warfarin 5 mg PO today at 1600. Continue to follow hemoglobin and signs or symptoms of bleeding/embolic events.   Kenda Paula, PharmD, CPP Clinical Pharmacist Practitioner 06/30/2023,7:36 AM

## 2023-06-30 NOTE — Progress Notes (Signed)
 Physical Therapy Treatment Patient Details Name: [name] MRN: [id number] DOB: 1950-03-22 Today's Date: 06/30/2023   History of Present Illness 73 y.o. female admitted 06/18/23 with LB edema, DOE and acute on chronic HFpEF. PMHx: COPD on O2, MVR, Rt CEA, PAF, CHF, HLD, GERD, HTN, GIB,  pulmonary hypertension/RV failure, T2DM, PAD, renal CA, tobacco use.    PT Comments  Pt sitting on EOB finishing her breakfast on entry, requesting pain medication for her shoulder. Pt shoulder painful to palpation across top of R scapula, R clavicle, and down to R mid humerus. Pt reports not wanting to walk due to pain. Agreeable to getting up to [facility] and back to bed. Pt is min A for pivot transfer with 1x LoB requiring modA to steady. Pt returned to bed with supervision, and PT provided ice packs for shoulder. D/c plans remain appropriate. PT will continue to follow acutely.    If plan is discharge home, recommend the following: A little help with walking and/or transfers;A little help with bathing/dressing/bathroom;Assist for transportation;Assistance with cooking/housework   Can travel by private vehicle     Yes  Equipment Recommendations  None recommended by PT       Precautions / Restrictions Precautions Precautions: Fall;Other (comment) Recall of Precautions/Restrictions: Impaired Precaution/Restrictions Comments: watch SpO2 Restrictions Weight Bearing Restrictions Per Provider Order: No     Mobility  Bed Mobility Overal bed mobility: Modified Independent Bed Mobility: Sit to Supine       Sit to supine: Supervision   General bed mobility comments: supervision for safety    Transfers Overall transfer level: Needs assistance   Transfers: Sit to/from Stand Sit to Stand: Min assist, Mod assist   Step pivot transfers: Min assist       General transfer comment: min A for steadying to stand and take pivotal steps to and from [facility], 1x LoB requiring mod A for steadying on way to  [facility]    Ambulation/Gait               General Gait Details: pt declining gait due to R shoulder pain       Balance Overall balance assessment: History of Falls, Needs assistance Sitting-balance support: No upper extremity supported, Feet supported Sitting balance-Leahy Scale: Good     Standing balance support: Single extremity supported, During functional activity Standing balance-Leahy Scale: Poor                              Communication Communication Communication: No apparent difficulties Factors Affecting Communication: Hearing impaired  Cognition Arousal: Alert Behavior During Therapy: WFL for tasks assessed/performed   PT - Cognitive impairments: No apparent impairments                       PT - Cognition Comments: easily distracted and requiring redirection back to task at hand Following commands: Intact      Cueing Cueing Techniques: Verbal cues  Exercises General Exercises - Lower Extremity Long Arc Quad: AROM, Both, 5 reps, Seated    General Comments General comments (skin integrity, edema, etc.): VSS on 2L O2 via [place]      Pertinent Vitals/Pain Pain Assessment Pain Assessment: Faces Faces Pain Scale: Hurts whole lot Pain Location: R shoulder Pain Descriptors / Indicators: Guarding Pain Intervention(s): Limited activity within patient's tolerance, Monitored during session, Repositioned, Patient requesting pain meds-RN notified, Ice applied     PT Goals (current goals can now be  found in the care plan section) Acute Rehab PT Goals Patient Stated Goal: Return Home and stop falling PT Goal Formulation: With patient Time For Goal Achievement: 07/04/23 Potential to Achieve Goals: Fair Progress towards PT goals: Not progressing toward goals - comment (limited by shoulder pain)    Frequency    Min 2X/week          AM-PAC PT "6 Clicks" Mobility   Outcome Measure  Help needed turning from your back to your side while  in a flat bed without using bedrails?: A Little Help needed moving from lying on your back to sitting on the side of a flat bed without using bedrails?: A Little Help needed moving to and from a bed to a chair (including a wheelchair)?: A Little Help needed standing up from a chair using your arms (e.g., wheelchair or bedside chair)?: A Little Help needed to walk in hospital room?: A Little Help needed climbing 3-5 steps with a railing? : A Lot 6 Click Score: 17    End of Session Equipment Utilized During Treatment: Oxygen  Activity Tolerance: Patient limited by pain Patient left: with call bell/phone within reach;in bed;with bed alarm set Nurse Communication: Mobility status PT Visit Diagnosis: History of falling (Z91.81);Difficulty in walking, not elsewhere classified (R26.2);Muscle weakness (generalized) (M62.81);Other abnormalities of gait and mobility (R26.89)     Time: 1610-9604 PT Time Calculation (min) (ACUTE ONLY): 27 min  Charges:    $Therapeutic Activity: 23-37 mins PT General Charges $$ ACUTE PT VISIT: 1 Visit                     Brad Mcgaughy B. Jewel Mortimer PT, DPT Acute Rehabilitation Services Please use secure chat or  Call Office [phone]    Verlie Glisson [facility] 06/30/2023, 8:53 AM

## 2023-06-30 NOTE — Progress Notes (Signed)
 OT Cancellation Note  Patient Details Name: Anna Beaird MRN: [id number] DOB: 1950/05/01   Cancelled Treatment:    Reason Eval/Treat Not Completed: Other (comment) (Pt reporting about shoulder bothering them and then about wanting to finsih meal.)  Mayla Biddy K OTR/L  Acute Rehab Services  [phone] office number  Stevphen Elders 06/30/2023, 12:38 PM

## 2023-07-01 DIAGNOSIS — R400 Somnolence: Secondary | ICD-10-CM | POA: Diagnosis present

## 2023-07-01 DIAGNOSIS — I4811 Longstanding persistent atrial fibrillation: Secondary | ICD-10-CM | POA: Diagnosis not present

## 2023-07-01 DIAGNOSIS — G4489 Other headache syndrome: Secondary | ICD-10-CM | POA: Diagnosis not present

## 2023-07-01 DIAGNOSIS — R4182 Altered mental status, unspecified: Secondary | ICD-10-CM | POA: Diagnosis not present

## 2023-07-01 DIAGNOSIS — R5383 Other fatigue: Secondary | ICD-10-CM | POA: Diagnosis not present

## 2023-07-01 DIAGNOSIS — N390 Urinary tract infection, site not specified: Secondary | ICD-10-CM | POA: Diagnosis not present

## 2023-07-01 DIAGNOSIS — I272 Pulmonary hypertension, unspecified: Secondary | ICD-10-CM | POA: Diagnosis not present

## 2023-07-01 DIAGNOSIS — R2689 Other abnormalities of gait and mobility: Secondary | ICD-10-CM | POA: Diagnosis not present

## 2023-07-01 DIAGNOSIS — S6992XA Unspecified injury of left wrist, hand and finger(s), initial encounter: Secondary | ICD-10-CM | POA: Diagnosis present

## 2023-07-01 DIAGNOSIS — Y92009 Unspecified place in unspecified non-institutional (private) residence as the place of occurrence of the external cause: Secondary | ICD-10-CM | POA: Diagnosis not present

## 2023-07-01 DIAGNOSIS — I509 Heart failure, unspecified: Secondary | ICD-10-CM | POA: Diagnosis not present

## 2023-07-01 DIAGNOSIS — S0291XA Unspecified fracture of skull, initial encounter for closed fracture: Secondary | ICD-10-CM | POA: Diagnosis not present

## 2023-07-01 DIAGNOSIS — I059 Rheumatic mitral valve disease, unspecified: Secondary | ICD-10-CM | POA: Diagnosis not present

## 2023-07-01 DIAGNOSIS — I10 Essential (primary) hypertension: Secondary | ICD-10-CM | POA: Diagnosis not present

## 2023-07-01 DIAGNOSIS — I482 Chronic atrial fibrillation, unspecified: Secondary | ICD-10-CM | POA: Diagnosis not present

## 2023-07-01 DIAGNOSIS — S0990XA Unspecified injury of head, initial encounter: Secondary | ICD-10-CM | POA: Diagnosis not present

## 2023-07-01 DIAGNOSIS — S0083XA Contusion of other part of head, initial encounter: Secondary | ICD-10-CM | POA: Diagnosis not present

## 2023-07-01 DIAGNOSIS — I130 Hypertensive heart and chronic kidney disease with heart failure and stage 1 through stage 4 chronic kidney disease, or unspecified chronic kidney disease: Secondary | ICD-10-CM | POA: Diagnosis not present

## 2023-07-01 DIAGNOSIS — J4489 Other specified chronic obstructive pulmonary disease: Secondary | ICD-10-CM | POA: Diagnosis not present

## 2023-07-01 DIAGNOSIS — I129 Hypertensive chronic kidney disease with stage 1 through stage 4 chronic kidney disease, or unspecified chronic kidney disease: Secondary | ICD-10-CM | POA: Diagnosis not present

## 2023-07-01 DIAGNOSIS — S61012A Laceration without foreign body of left thumb without damage to nail, initial encounter: Secondary | ICD-10-CM | POA: Diagnosis not present

## 2023-07-01 DIAGNOSIS — Z7951 Long term (current) use of inhaled steroids: Secondary | ICD-10-CM | POA: Diagnosis not present

## 2023-07-01 DIAGNOSIS — I270 Primary pulmonary hypertension: Secondary | ICD-10-CM | POA: Diagnosis not present

## 2023-07-01 DIAGNOSIS — F17211 Nicotine dependence, cigarettes, in remission: Secondary | ICD-10-CM | POA: Diagnosis not present

## 2023-07-01 DIAGNOSIS — I6782 Cerebral ischemia: Secondary | ICD-10-CM | POA: Diagnosis not present

## 2023-07-01 DIAGNOSIS — I672 Cerebral atherosclerosis: Secondary | ICD-10-CM | POA: Diagnosis not present

## 2023-07-01 DIAGNOSIS — E1142 Type 2 diabetes mellitus with diabetic polyneuropathy: Secondary | ICD-10-CM | POA: Diagnosis not present

## 2023-07-01 DIAGNOSIS — R488 Other symbolic dysfunctions: Secondary | ICD-10-CM | POA: Diagnosis not present

## 2023-07-01 DIAGNOSIS — Z9181 History of falling: Secondary | ICD-10-CM | POA: Diagnosis not present

## 2023-07-01 DIAGNOSIS — W19XXXA Unspecified fall, initial encounter: Secondary | ICD-10-CM | POA: Diagnosis not present

## 2023-07-01 DIAGNOSIS — N183 Chronic kidney disease, stage 3 unspecified: Secondary | ICD-10-CM | POA: Diagnosis not present

## 2023-07-01 DIAGNOSIS — Z7901 Long term (current) use of anticoagulants: Secondary | ICD-10-CM | POA: Diagnosis not present

## 2023-07-01 DIAGNOSIS — N182 Chronic kidney disease, stage 2 (mild): Secondary | ICD-10-CM | POA: Diagnosis not present

## 2023-07-01 DIAGNOSIS — J209 Acute bronchitis, unspecified: Secondary | ICD-10-CM | POA: Diagnosis not present

## 2023-07-01 DIAGNOSIS — J45909 Unspecified asthma, uncomplicated: Secondary | ICD-10-CM | POA: Diagnosis not present

## 2023-07-01 DIAGNOSIS — S0003XA Contusion of scalp, initial encounter: Secondary | ICD-10-CM | POA: Diagnosis not present

## 2023-07-01 DIAGNOSIS — J449 Chronic obstructive pulmonary disease, unspecified: Secondary | ICD-10-CM | POA: Diagnosis not present

## 2023-07-01 DIAGNOSIS — I5033 Acute on chronic diastolic (congestive) heart failure: Secondary | ICD-10-CM | POA: Diagnosis not present

## 2023-07-01 DIAGNOSIS — Z794 Long term (current) use of insulin: Secondary | ICD-10-CM | POA: Diagnosis not present

## 2023-07-01 DIAGNOSIS — I700 Atherosclerosis of aorta: Secondary | ICD-10-CM | POA: Diagnosis not present

## 2023-07-01 DIAGNOSIS — S2241XA Multiple fractures of ribs, right side, initial encounter for closed fracture: Secondary | ICD-10-CM | POA: Diagnosis not present

## 2023-07-01 DIAGNOSIS — Z85528 Personal history of other malignant neoplasm of kidney: Secondary | ICD-10-CM | POA: Diagnosis not present

## 2023-07-01 DIAGNOSIS — Z87891 Personal history of nicotine dependence: Secondary | ICD-10-CM | POA: Diagnosis not present

## 2023-07-01 DIAGNOSIS — I5022 Chronic systolic (congestive) heart failure: Secondary | ICD-10-CM | POA: Diagnosis not present

## 2023-07-01 DIAGNOSIS — E1122 Type 2 diabetes mellitus with diabetic chronic kidney disease: Secondary | ICD-10-CM | POA: Diagnosis not present

## 2023-07-01 DIAGNOSIS — E119 Type 2 diabetes mellitus without complications: Secondary | ICD-10-CM | POA: Diagnosis not present

## 2023-07-01 DIAGNOSIS — I6523 Occlusion and stenosis of bilateral carotid arteries: Secondary | ICD-10-CM | POA: Diagnosis not present

## 2023-07-01 DIAGNOSIS — Z7401 Bed confinement status: Secondary | ICD-10-CM | POA: Diagnosis not present

## 2023-07-01 DIAGNOSIS — I959 Hypotension, unspecified: Secondary | ICD-10-CM | POA: Diagnosis not present

## 2023-07-01 DIAGNOSIS — I5030 Unspecified diastolic (congestive) heart failure: Secondary | ICD-10-CM | POA: Diagnosis not present

## 2023-07-01 DIAGNOSIS — R531 Weakness: Secondary | ICD-10-CM | POA: Diagnosis not present

## 2023-07-01 DIAGNOSIS — R41841 Cognitive communication deficit: Secondary | ICD-10-CM | POA: Diagnosis not present

## 2023-07-01 DIAGNOSIS — Z043 Encounter for examination and observation following other accident: Secondary | ICD-10-CM | POA: Diagnosis not present

## 2023-07-01 DIAGNOSIS — Z79899 Other long term (current) drug therapy: Secondary | ICD-10-CM | POA: Diagnosis not present

## 2023-07-01 DIAGNOSIS — J439 Emphysema, unspecified: Secondary | ICD-10-CM | POA: Diagnosis not present

## 2023-07-01 DIAGNOSIS — M6281 Muscle weakness (generalized): Secondary | ICD-10-CM | POA: Diagnosis not present

## 2023-07-01 DIAGNOSIS — S199XXA Unspecified injury of neck, initial encounter: Secondary | ICD-10-CM | POA: Diagnosis not present

## 2023-07-01 LAB — GLUCOSE, CAPILLARY
Glucose-Capillary: 183 mg/dL — ABNORMAL HIGH (ref 70–99)
Glucose-Capillary: 109 mg/dL — ABNORMAL HIGH (ref 70–99)
Glucose-Capillary: 221 mg/dL — ABNORMAL HIGH (ref 70–99)

## 2023-07-01 LAB — PROTIME-INR
INR: 2.6 — ABNORMAL HIGH (ref 0.8–1.2)
Prothrombin Time: 28.2 s — ABNORMAL HIGH (ref 11.4–15.2)

## 2023-07-01 MED ORDER — HYDROCODONE-ACETAMINOPHEN 5-325 MG PO TABS
2.0000 | ORAL_TABLET | Freq: Two times a day (BID) | ORAL | 0 refills | Status: DC
Start: 2023-07-01 — End: 2023-08-05

## 2023-07-01 MED ORDER — CARVEDILOL 6.25 MG PO TABS
6.2500 mg | ORAL_TABLET | Freq: Two times a day (BID) | ORAL | Status: DC
Start: 2023-07-01 — End: 2023-08-05

## 2023-07-01 MED ORDER — DULOXETINE HCL 60 MG PO CPEP: 60.0000 mg | ORAL_CAPSULE | Freq: Once | ORAL | Status: DC

## 2023-07-01 MED ORDER — INSULIN ASPART 100 UNIT/ML IJ SOLN: 0.0000 [IU] | Freq: Every day | INTRAMUSCULAR | Status: DC

## 2023-07-01 MED ORDER — SPIRONOLACTONE 50 MG PO TABS
50.0000 mg | ORAL_TABLET | Freq: Every day | ORAL | Status: DC
Start: 2023-07-01 — End: 2023-08-05

## 2023-07-01 MED ORDER — LIDOCAINE 5 % EX PTCH: 1.0000 | MEDICATED_PATCH | CUTANEOUS | Status: DC

## 2023-07-01 MED ORDER — GUAIFENESIN ER 600 MG PO TB12: 600.0000 mg | ORAL_TABLET | Freq: Two times a day (BID) | ORAL | Status: DC | PRN

## 2023-07-01 MED ORDER — WARFARIN SODIUM 5 MG PO TABS: 5.0000 mg | ORAL_TABLET | Freq: Once | ORAL | Status: DC

## 2023-07-01 MED ORDER — INSULIN ASPART 100 UNIT/ML IJ SOLN: 4.0000 [IU] | Freq: Three times a day (TID) | INTRAMUSCULAR | Status: DC

## 2023-07-01 MED ORDER — FUROSEMIDE 40 MG PO TABS: 40.0000 mg | ORAL_TABLET | Freq: Every day | ORAL | Status: DC

## 2023-07-01 MED ORDER — INSULIN ASPART 100 UNIT/ML IJ SOLN: 0.0000 [IU] | Freq: Three times a day (TID) | INTRAMUSCULAR | Status: DC

## 2023-07-01 MED ORDER — DICLOFENAC SODIUM 1 % EX GEL: 4.0000 g | Freq: Three times a day (TID) | CUTANEOUS | Status: DC

## 2023-07-01 MED ORDER — ALPRAZOLAM 0.5 MG PO TABS: 0.5000 mg | ORAL_TABLET | Freq: Every day | ORAL | 0 refills | Status: DC

## 2023-07-01 MED ORDER — KETOROLAC TROMETHAMINE 15 MG/ML IJ SOLN
15.0000 mg | Freq: Once | INTRAMUSCULAR | Status: AC
  Administered 2023-07-01: 15 mg via INTRAVENOUS
  Filled 2023-07-01: qty 1

## 2023-07-01 MED ORDER — INSULIN GLARGINE-YFGN 100 UNIT/ML ~~LOC~~ SOLN: 40.0000 [IU] | Freq: Every day | SUBCUTANEOUS | Status: DC

## 2023-07-01 MED ORDER — SACUBITRIL-VALSARTAN 24-26 MG PO TABS: 1.0000 | ORAL_TABLET | Freq: Two times a day (BID) | ORAL | Status: DC

## 2023-07-01 NOTE — Discharge Summary (Signed)
 Name: [name] MRN: [id number] DOB: 02-09-1951 73 y.o. PCP: Sherol Dixie, MD  Date of Admission: 06/18/2023 11:27 AM Date of Discharge:  07/01/2023 Attending Physician: Dr. Broadus Canes  DISCHARGE DIAGNOSIS:  Primary Problem: Acute on chronic heart failure with preserved ejection fraction (HFpEF) [facility])   Hospital Problems: Principal Problem:   Acute on chronic heart failure with preserved ejection fraction (HFpEF) (HCC) Active Problems:   Hypertension   Malnutrition of moderate degree   Pulmonary hypertension (HCC)   S/P MVR (mitral valve replacement)   Fall    DISCHARGE MEDICATIONS:   Allergies as of 07/01/2023       Reactions   Dexilant [dexlansoprazole] Other (See Comments)   Made acid reflux worse   Metoclopramide     Other Reaction(s): unknown   Isovue  [iopamidol ] Itching, Rash   Lorazepam Other (See Comments)   Patient's sister noted that ativan caused the patient to become extremely confused during hospitalization 09/2010; tolerates Xanax    Oxycontin  [oxycodone ] Other (See Comments)   headache   Tramadol Hcl Swelling   Ankle swelling        Medication List     PAUSE taking these medications    potassium chloride  10 MEQ tablet Wait to take this until your doctor or other care provider tells you to start again. Commonly known as: KLOR-CON  M Take 1 tablet (10 mEq total) by mouth daily.       STOP taking these medications    ketoconazole  2 % cream Commonly known as: NIZORAL    losartan  50 MG tablet Commonly known as: COZAAR    metoprolol  succinate 25 MG 24 hr tablet Commonly known as: Toprol  XL   predniSONE  20 MG tablet Commonly known as: DELTASONE    Senna-S 8.6-50 MG tablet Generic drug: senna-docusate       TAKE these medications    albuterol  108 (90 Base) MCG/ACT inhaler Commonly known as: VENTOLIN  HFA Inhale 2 puffs into the lungs every 6 hours as needed for shortness of breath   ALPRAZolam  0.5 MG tablet Commonly  known as: Xanax  Take 1 tablet (0.5 mg total) by mouth at bedtime. To help with sleep.   buPROPion  150 MG 24 hr tablet Commonly known as: Wellbutrin  XL Take 1 tablet (150 mg total) by mouth every morning.   carvedilol  6.25 MG tablet Commonly known as: COREG  Take 1 tablet (6.25 mg total) by mouth 2 (two) times daily with a meal.   diclofenac  Sodium 1 % Gel Commonly known as: VOLTAREN  Apply 4 g topically 4 (four) times daily -  before meals and at bedtime.   DULoxetine  60 MG capsule Commonly known as: CYMBALTA  Take 1 capsule (60 mg total) by mouth once for 1 dose. What changed: when to take this   FreeStyle Libre 3 Plus Sensor Misc Change sensor every 15 days.   furosemide  40 MG tablet Commonly known as: LASIX  Take 1 tablet (40 mg total) by mouth daily.   gabapentin  600 MG tablet Commonly known as: NEURONTIN  Take 1 tablet (600 mg total) by mouth at bedtime.   guaiFENesin  600 MG 12 hr tablet Commonly known as: MUCINEX  Take 1 tablet (600 mg total) by mouth 2 (two) times daily as needed for cough or to loosen phlegm.   HYDROcodone -acetaminophen  5-325 MG tablet Commonly known as: NORCO/VICODIN Take 2 tablets by mouth 2 (two) times daily.   insulin  aspart 100 UNIT/ML injection Commonly known as: novoLOG  Inject 4 Units into the skin 3 (three) times daily with meals.   insulin  aspart 100 UNIT/ML  injection Commonly known as: novoLOG  Inject 0-15 Units into the skin 3 (three) times daily with meals.   insulin  aspart 100 UNIT/ML injection Commonly known as: novoLOG  Inject 0-5 Units into the skin at bedtime.   insulin  glargine-yfgn 100 UNIT/ML injection Commonly known as: SEMGLEE  Inject 0.4 mLs (40 Units total) into the skin daily.   ipratropium 0.03 % nasal spray Commonly known as: ATROVENT  Instill 2 sprays into the nose 3 (three) times daily as directed   ipratropium-albuterol  0.5-2.5 (3) MG/3ML Soln Commonly known as: DUONEB Inhale 3 mLs by nebulization every 6 (six)  hours as needed (shortness of breath, wheezing).   lidocaine  5 % Commonly known as: LIDODERM  Place 1 patch onto the skin daily. Remove & Discard patch within 12 hours or as directed by MD   Linzess  72 MCG capsule Generic drug: linaclotide  Take 1 capsule (72 mcg total) by mouth daily before breakfast.   omeprazole  40 MG capsule Commonly known as: PRILOSEC Take 1 capsule (40 mg total) by mouth 2 (two) times daily.   ondansetron  4 MG tablet Commonly known as: Zofran  Take 1 tablet (4 mg total) by mouth daily as needed for nausea or vomiting.   OXYGEN  Inhale 2 L into the lungs at bedtime.   ramelteon  8 MG tablet Commonly known as: ROZEREM  Take 1 tablet (8 mg total) by mouth at bedtime.   sacubitril -valsartan  24-26 MG Commonly known as: ENTRESTO  Take 1 tablet by mouth 2 (two) times daily.   spironolactone  50 MG tablet Commonly known as: ALDACTONE  Take 1 tablet (50 mg total) by mouth daily.   TechLite Plus Pen Needles 32G X 4 MM Misc Generic drug: Insulin  Pen Needle Use to inject insulin  daily   Trelegy Ellipta  100-62.5-25 MCG/ACT Aepb Generic drug: Fluticasone -Umeclidin-Vilant Inhale 1 puff into the lungs daily.   Tresiba  FlexTouch 100 UNIT/ML FlexTouch Pen Generic drug: insulin  degludec Inject 30 Units into the skin every morning.   venlafaxine  XR 150 MG 24 hr capsule Commonly known as: EFFEXOR -XR Take 1 capsule (150 mg total) by mouth daily with breakfast.   warfarin 5 MG tablet Commonly known as: COUMADIN  Take as directed. If you are unsure how to take this medication, talk to your nurse or doctor. Original instructions: Take 1 tablet (5 mg total) by mouth one time only at 4 PM. What changed:  medication strength how much to take when to take this        DISPOSITION AND FOLLOW-UP:  [name] was discharged from [facility] in Stable condition. At the hospital follow up visit please address:  Acute on chronic  HFpEF Pulmonary hypertension Consider BMP and ensure compliance with updated regimen   Hypertension Consider repeat orthostatics as patient was borderline during admission.   Paroxysmal Atrial Fibrillation MV Stenosis s/p bioprosthetic repair Ensure no signs of bleeding. Per pharmacy, repeat PT/INR on Sunday 5/25 to assess dosing.    COPD Tobacco Use Disorder Inquire about continued cessation and provide resources if she is still smoking.  Deconditioning Mechanical Fall Ensure shoulder pain has resolved/improved. Consider MRI if refractory.  HOSPITAL COURSE:  Patient Summary: Necola Bluestein is a 73 y.o.female with pertinent PMH of HFpEF, R carotid artery stenosis s/p endarterectomy, MV stenosis s/p bioprosthetic valvular repair,  paroxysmal A-fib on warfarin, tobacco use disorder, T2DM, and COPD/emphysema on 2 L [place] at bedtime, who presented with bilateral lower extremity edema and admitted for HFpEF exacerbation.    Acute on chronic HFpEF Pulmonary hypertension Presented with increasing SOB, bilateral lower  extremity edema, worsening orthopnea/PND.  BNP elevated at 994. Cardiology consulted for management given underlying MV stenosis/A-Fib.  Treated with IV diuretics with good response.  Aldactone  was added to home regimen.  Home furosemide  was increased from 20 to 40 mg daily. Home Losartan  was discontinued in exchange for Entresto . And given concomitant hypertension, Lopressor  was exchanged for Carvedilol . Symptoms markedly improved and she was euvolemic days leading up to discharge. New dry weight is ~ 130 pounds. Cardiology recommended [facility]  outpatient after repeat labs.   Hypertension Hypertensive to 190s day of admission which continued despite diuresis.  Lopressor  was discontinued and exchanged for carvedilol  which did improved blood pressure considerably. She had transient episode of bradycardia in 40's to 50's but was largely asymptomatic. Coreg  was decreased from  12.5 to 6.125 BID with good response.   Paroxysmal Atrial Fibrillation MV Stenosis s/p bioprosthetic repair Pharmacy managed warfarin dosing which was mildly subtherapeutic initially during admission thought secondary to vitamin K from Ensure shakes. INR goal was obtained in days leading up to discharge.      COPD Tobacco Use Disorder Managed on formulary equivalent of home Trelegy without complication. Provided Nicotine  patches and continually counseled on the importance of smoking cessation.   Deconditioning Mechanical Fall Worked with PT/OT who continually recommended SNF for further rehabilitation where she will go upon discharge. Had mechanical fall 5/18 with complaints of right shoulder pain. Denies hitting head/LOC. X-ray of right shoulder negative. Possible rotator cuff pathology but deferred MRI for now as pain has been stable. She will continue rehab for this and overall deconditioning at [name].    DISCHARGE INSTRUCTIONS:   Discharge Instructions     (HEART FAILURE PATIENTS) Call MD:  Anytime you have any of the following symptoms: 1) 3 pound weight gain in 24 hours or 5 pounds in 1 week 2) shortness of breath, with or without a dry hacking cough 3) swelling in the hands, feet or stomach 4) if you have to sleep on extra pillows at night in order to breathe.   Complete by: As directed    Call MD for:  difficulty breathing, headache or visual disturbances   Complete by: As directed    Call MD for:  extreme fatigue   Complete by: As directed    Call MD for:  hives   Complete by: As directed    Call MD for:  persistant dizziness or light-headedness   Complete by: As directed    Call MD for:  persistant nausea and vomiting   Complete by: As directed    Call MD for:  redness, tenderness, or signs of infection (pain, swelling, redness, odor or green/yellow discharge around incision site)   Complete by: As directed    Call MD for:  severe uncontrolled pain   Complete by: As  directed    Call MD for:  temperature >100.4   Complete by: As directed    Diet - low sodium heart healthy   Complete by: As directed    Discharge instructions   Complete by: As directed    You were admitted to the hospital for shortness of breath that was caused by an exacerbation of your heart failure.  The suspected cause of this is that you are requiring a higher dose of furosemide  (Lasix ).  Your heart failure has also been treated by starting you on 2 new medications called Aldactone  and Entresto .  You have responded well and your symptoms have improved.  If you notice worsening shortness of breath or  that you have unexpectedly gained 3 pounds in 1 day or 5 pounds in 1 week, please take an extra dose of your furosemide  and let the doctor know. Your new "dry" weight (your weight when you don't have extra fluid) is around 130 pounds. Use this weight to guide the above. In regards to your overall health, the best thing you can do is continue to not smoke cigarettes.  It has now been almost 2 weeks since your last cigarette, and we are very proud of you for that.  We will provide nicotine  patches to further help you quit.   Unfortunately you have some residual shoulder pain after your fall but thankfully the x-rays were negative. I anticipate the shoulder pain improving, but we will look into other options if it does not. The therapy team at [facility] will help with this as well.  We have arranged a follow-up at the [facility] on June 4th at 10:15. If you have any questions in the meantime please call the [facility] at the new number [phone]. All the best to you in the meantime.   Increase activity slowly   Complete by: As directed    No wound care   Complete by: As directed        SUBJECTIVE:  No concerns. Shoulder pain is doing okay. Comfortable with discharge to [place]. Discharge Vitals:   BP 131/82 (BP Location: Left Arm)   Pulse (!) 55   Temp 97.7 F (36.5 C) (Oral)   Resp 13    Ht 5\' 2"  (1.575 m)   Wt 58.8 kg   SpO2 98%   BMI 23.72 kg/m   OBJECTIVE:  Constitutional: sitting up in bed, in no acute distress Cardiovascular: regular rate and rhythm, no m/r/g, no LEE Pulmonary/Chest: normal work of breathing on RA, coarse sounds bilaterally Neurological: alert & oriented  Pertinent Labs, Studies, and Procedures:     Latest Ref Rng & Units 06/27/2023    2:27 AM 06/26/2023    5:26 AM 06/25/2023    2:51 AM  CBC  WBC 4.0 - 10.5 K/uL 9.2  8.8  15.8   Hemoglobin 12.0 - 15.0 g/dL 09.8  11.9  14.7   Hematocrit 36.0 - 46.0 % 39.7  44.0  42.6   Platelets 150 - 400 K/uL 274  285  293        Latest Ref Rng & Units 06/30/2023    3:32 AM 06/29/2023    2:47 AM 06/28/2023    2:46 AM  CMP  Glucose 70 - 99 mg/dL 829  562  130   BUN 8 - 23 mg/dL 34  33  52   Creatinine 0.44 - 1.00 mg/dL 8.65  7.84  6.96   Sodium 135 - 145 mmol/L 134  137  135   Potassium 3.5 - 5.1 mmol/L 5.1  4.6  4.9   Chloride 98 - 111 mmol/L 104  104  99   CO2 22 - 32 mmol/L 24  27  29    Calcium  8.9 - 10.3 mg/dL 9.7  9.3  29.5     DG Chest 2 View Result Date: 06/18/2023 CLINICAL DATA:  Shortness of breath, cough. EXAM: CHEST - 2 VIEW COMPARISON:  Radiographs 05/20/2023, 05/15/2023 and 11/19/2022. CT 10/17/2022. FINDINGS: Stable mild cardiac enlargement post median sternotomy and mitral valve replacement. There is aortic atherosclerosis. Chronic blunting of the left costophrenic sulcus. Mild chronic interstitial prominence and scattered pulmonary scarring. No edema, confluent airspace disease, pneumothorax or significant pleural effusion.  The bones appear unchanged post right clavicular ORIF. Old rib fractures are noted on the right. No acute osseous findings are seen. IMPRESSION: No evidence of acute cardiopulmonary process. Chronic interstitial prominence and scattered pulmonary scarring. Electronically Signed   By: Elmon Hagedorn M.D.   On: 06/18/2023 12:33     Signed: Ronni Colace, DO  Internal  Medicine Resident, PGY-1 [facility]  Pager: [phone] 10:24 AM, 07/01/2023

## 2023-07-01 NOTE — Progress Notes (Signed)
 Occupational Therapy Treatment Patient Details Name: [name] MRN: [id number] DOB: 29-Jan-1951 Today's Date: 07/01/2023   History of present illness 73 y.o. female admitted 06/18/23 with LB edema, DOE and acute on chronic HFpEF. PMHx: COPD on O2, MVR, Rt CEA, PAF, CHF, HLD, GERD, HTN, GIB,  pulmonary hypertension/RV failure, T2DM, PAD, renal CA, tobacco use.   OT comments  Pt at this time presented on [facility] with nursing. She completed peri care with lateral lean to side with CGA. She then agreed to completed what she was able but needed max cues to keep eyes open in session to remain awake but would say "I am awake." She completed lateral stepping at EOB with CGA to min assist to direct walker. Pt simulated LB dressing with therapist with CGA due to decrease insight in lateral and posterior lean while sitting at EOB. She then wanted to lay back into the bed with supervision. Patient will benefit from continued inpatient follow up therapy, <3 hours/day.      If plan is discharge home, recommend the following:  A little help with walking and/or transfers;A little help with bathing/dressing/bathroom;Assistance with cooking/housework;Assist for transportation;Help with stairs or ramp for entrance   Equipment Recommendations  Other (comment) (TBD)    Recommendations for Other Services      Precautions / Restrictions Precautions Precautions: Fall;Other (comment) Recall of Precautions/Restrictions: Impaired Precaution/Restrictions Comments: watch SpO2 Restrictions Weight Bearing Restrictions Per Provider Order: No       Mobility Bed Mobility Overal bed mobility: Needs Assistance Bed Mobility: Sit to Supine       Sit to supine: Supervision        Transfers Overall transfer level: Needs assistance Equipment used: Rolling walker (2 wheels) Transfers: Sit to/from Stand Sit to Stand: Contact guard assist     Step pivot transfers: Min assist           Balance Overall  balance assessment: History of Falls Sitting-balance support: Feet supported, Bilateral upper extremity supported Sitting balance-Leahy Scale: Good     Standing balance support: Bilateral upper extremity supported, Single extremity supported Standing balance-Leahy Scale: Poor                             ADL either performed or assessed with clinical judgement   ADL Overall ADL's : Needs assistance/impaired Eating/Feeding: Set up;Sitting   Grooming: Set up;Sitting   Upper Body Bathing: Contact guard assist;Sitting   Lower Body Bathing: Contact guard assist;Sitting/lateral leans;Sit to/from stand   Upper Body Dressing : Contact guard assist;Sitting   Lower Body Dressing: Contact guard assist;Sitting/lateral leans;Sit to/from stand   Toilet Transfer: Contact guard assist;Minimal assistance;Cueing for safety;Cueing for sequencing Toilet Transfer Details (indicate cue type and reason): hand held assist to CGA with RW Toileting- Clothing Manipulation and Hygiene: Set up;Sitting/lateral lean       Functional mobility during ADLs: Contact guard assist;Minimal assistance;Rolling walker (2 wheels);Cueing for sequencing;Cueing for safety      Extremity/Trunk Assessment Upper Extremity Assessment Upper Extremity Assessment: Generalized weakness;RUE deficits/detail RUE Deficits / Details: Since fall pt has complained of pain in RUE. Today pt able to complete AROM to about 80 degrees shouldr flexion with abducuction. RUE Sensation: WNL RUE Coordination: decreased gross motor   Lower Extremity Assessment Lower Extremity Assessment: Defer to PT evaluation        Vision       Perception     Praxis     Communication Communication Communication: No  apparent difficulties Factors Affecting Communication: Hearing impaired   Cognition Arousal: Lethargic Behavior During Therapy: WFL for tasks assessed/performed Cognition: No family/caregiver present to determine  baseline         Attention impairment (select first level of impairment): Divided attention Executive functioning impairment (select all impairments): Problem solving OT - Cognition Comments: Pt closing eyes often in session and falling asleep                 Following commands: Impaired Following commands impaired: Follows multi-step commands with increased time      Cueing   Cueing Techniques: Verbal cues  Exercises      Shoulder Instructions       General Comments Pt often placing o2 on forehead in session but when placed on and gor a read prior to activity was at 96% and then following actvities 91%    Pertinent Vitals/ Pain       Pain Assessment Pain Assessment: 0-10 Pain Score: 8  Pain Location: R shoulder Pain Descriptors / Indicators: Guarding Pain Intervention(s): Limited activity within patient's tolerance, Monitored during session, Premedicated before session, Repositioned  Home Living                                          Prior Functioning/Environment              Frequency  Min 2X/week        Progress Toward Goals  OT Goals(current goals can now be found in the care plan section)  Progress towards OT goals: Progressing toward goals  Acute Rehab OT Goals Patient Stated Goal: to sleep OT Goal Formulation: With patient Time For Goal Achievement: 07/14/23 Potential to Achieve Goals: Good ADL Goals Pt Will Perform Grooming: with modified independence;standing Pt Will Perform Lower Body Bathing: with modified independence;sitting/lateral leans;sit to/from stand Pt Will Perform Lower Body Dressing: with modified independence;sitting/lateral leans;sit to/from stand Pt Will Transfer to Toilet: with modified independence;ambulating;regular height toilet Pt/caregiver will Perform Home Exercise Program: Increased strength;Both right and left upper extremity;With theraband;With Supervision;Independently;With written HEP  provided Additional ADL Goal #1: Patient will demonstrate ability to Independently state 3 energy conservation strategies to increase safety and independence with functional tasks. Additional ADL Goal #2: Patient will demonstrate ability to Independently state 3 fall prevention strategies for increased safety and independence during functional tasks.  Plan      Co-evaluation                 AM-PAC OT "6 Clicks" Daily Activity     Outcome Measure   Help from another person eating meals?: None Help from another person taking care of personal grooming?: A Little Help from another person toileting, which includes using toliet, bedpan, or urinal?: A Little Help from another person bathing (including washing, rinsing, drying)?: A Little Help from another person to put on and taking off regular upper body clothing?: A Little Help from another person to put on and taking off regular lower body clothing?: A Little 6 Click Score: 19    End of Session Equipment Utilized During Treatment: Oxygen ;Rolling walker (2 wheels)  OT Visit Diagnosis: Unsteadiness on feet (R26.81);Other abnormalities of gait and mobility (R26.89);Muscle weakness (generalized) (M62.81);Other (comment)   Activity Tolerance Patient limited by lethargy   Patient Left in bed;with call bell/phone within reach;with bed alarm set   Nurse Communication Mobility status  Time: 0912-0939 OT Time Calculation (min): 27 min  Charges: OT General Charges $OT Visit: 1 Visit OT Treatments $Self Care/Home Management : 23-37 mins  Erving Heather OTR/L  Acute Rehab Services  [phone] office number   Stevphen Elders 07/01/2023, 10:16 AM

## 2023-07-01 NOTE — Progress Notes (Signed)
 Patient's blood sugar was 109 this morning. Didn't show up in the results.

## 2023-07-01 NOTE — Progress Notes (Signed)
 Report called to [facility] and given to Juree, LPN

## 2023-07-01 NOTE — TOC Transition Note (Signed)
 Transition of Care [facility]) - Discharge Note   Patient Details  Name: [name] MRN: [id number] Date of Birth: April 05, 1950  Transition of Care [facility]) CM/SW Contact:  Arron Big, LCSWA Phone Number: 07/01/2023, 10:48 AM   Clinical Narrative:   Patient will DC to: [facility]  Anticipated DC date: 07/01/23  Family notified: [name] (sis) Transport by: [name] (sis)   Per MD patient ready for DC to [facility]. RN to call report prior to discharge [phone]; room 705p ). RN, patient, patient's family, and facility notified of DC. Discharge Summary and FL2 sent to facility. CSW spoke with [name] about providing transportation for her sister to [place], she is able to. CSW informed [name] to bring home O2 tank for transport/safety measures, she agreed to bring home O2. CSW notified treatment team of conversation and transportation plan.   CSW will sign off for now as social work intervention is no longer needed. Please consult us  again if new needs arise.      Final next level of care: Skilled Nursing Facility Barriers to Discharge: Barriers Resolved   Patient Goals and CMS Choice Patient states their goals for this hospitalization and ongoing recovery are:: To getter and retrun home   Choice offered to / list presented to : Sibling [place] ownership interest in [facility].provided to:: Sibling    Discharge Placement              Patient chooses bed at: [facility] Patient to be transferred to facility by: Family Name of family member notified: [name] (sis) Patient and family notified of of transfer: 07/01/23  Discharge Plan and Services Additional resources added to the After Visit Summary for       Post Acute Care Choice: Skilled Nursing Facility                               Social Drivers of Health (SDOH) Interventions SDOH Screenings   Food Insecurity: No Food Insecurity (06/18/2023)  Recent Concern: Food  Insecurity - Food Insecurity Present (05/25/2023)  Housing: Low Risk  (06/18/2023)  Transportation Needs: No Transportation Needs (06/18/2023)  Recent Concern: Transportation Needs - Unmet Transportation Needs (04/08/2023)  Utilities: Not At Risk (06/18/2023)  Alcohol Screen: Low Risk  (04/08/2023)  Depression (PHQ2-9): High Risk (04/08/2023)  Financial Resource Strain: Low Risk  (04/08/2023)  Physical Activity: Inactive (04/08/2023)  Social Connections: Socially Isolated (06/18/2023)  Stress: Stress Concern Present (04/08/2023)  Tobacco Use: High Risk (06/18/2023)  Health Literacy: Adequate Health Literacy (04/08/2023)     Readmission Risk Interventions     No data to display

## 2023-07-01 NOTE — Progress Notes (Signed)
 PHARMACY - ANTICOAGULATION CONSULT NOTE  Pharmacy Consult for warfarin dosing. Indication: atrial fibrillation  Allergies  Allergen Reactions   Dexilant [Dexlansoprazole] Other (See Comments)    Made acid reflux worse   Metoclopramide      Other Reaction(s): unknown   Isovue  [Iopamidol ] Itching and Rash   Lorazepam Other (See Comments)    Patient's sister noted that ativan caused the patient to become extremely confused during hospitalization 09/2010; tolerates Xanax    Oxycontin  [Oxycodone ] Other (See Comments)    headache   Tramadol Hcl Swelling    Ankle swelling    Patient Measurements: Height: 5\' 2"  (157.5 cm) Weight: 58.8 kg (129 lb 11.2 oz) IBW/kg (Calculated) : 50.1 HEPARIN  DW (KG): 63.3  Vital Signs: Temp: 97.8 F (36.6 C) (05/21 0511) Temp Source: Oral (05/21 0511) BP: 165/46 (05/21 0511) Pulse Rate: 76 (05/21 0511)  Labs: Recent Labs    06/29/23 0247 06/30/23 0332 07/01/23 0241  LABPROT 24.4* 27.8* 28.2*  INR 2.2* 2.6* 2.6*  CREATININE 0.88 0.97  --     Estimated Creatinine Clearance: 40.2 mL/min (by C-G formula based on SCr of 0.97 mg/dL).   Medical History: Past Medical History:  Diagnosis Date   Abnormality of lung on CXR 02/02/2020   Nonspecific finding on CXR ordered by pulmonologist - c/w inflammation vs infection, f/u imaging suggested, Dr. Antonette Batters office has been in communication about recommended next steps.   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   Non-bleeding when visualized on capsule endoscopy 06/30/2015    Arthritis    "lower back; hands" (02/19/2018)   Asthma    Asymptomatic cholelithiasis 09/25/2015   Seen on CT scan 08/2015   Carotid artery stenosis; s/p R endarterectomy    s/p right endarterectomy (06/2010) Carotid US  (07/2010):  Left: Moderate-to-severe (60-79%) calcific and non-calcific plaque origin  and proximal ICA and ECA    Chronic congestive heart failure with left ventricular diastolic dysfunction (HCC) 10/21/2010   Chronic constipation 02/03/2011   Chronic daily headache 01/16/2014   Chronic iron  deficiency anemia    Chronic low back pain 10/06/2012   Chronic venous insufficiency 08/04/2012   Closed fracture of one rib of left side 08/23/2021   ED visit after a fall 08/16/21    COPD (chronic obstructive pulmonary disease) with emphysema (HCC)    PFTs 2018: severe obstructive disease, insignif response to bronchiodilator, mild restriction parenchymal pattern, moderately severe diffusion defect. 2014  FEV1 0.92 (40%), ratio 69, 27% increase in FEV1 with BD, TLC 91%, severe airtrapping, DLCO49% On chronic home O2. Pulmonary rehab referral 05/2012    Dyspnea    Fibromyalgia 08/29/2010   Gastroesophageal reflux disease    History of blood transfusion    "several times"  (02/19/2018)   History of clear cell renal cell carcinoma (HCC), in remission 07/21/2011   s/p cryoablation of left RCC in 09/2011 by Dr. Nereida Banning. Followed by Dr. Joie Narrow  [facility]) .     History of fracture of left hip 10/17/2022   History of hiatal hernia    History of mitral valve replacement with bioprosthetic valve due to mitral stenosis 2012   s/p MVR with a 27-mm pericardial porcine valve ([employer] Mosaic valve, serial #[id number] on 09/20/10, Dr. Matt Song)    History of obstructive sleep apnea, resolved 2013   resolved per sleep study 07/2019; no apnea, but did have desaturation.  CPAP no longer necessary.  Nocturnal polysomnography (  06/2009): Moderate sleep apnea/ hypopnea syndrome , AHI 17.8 per hour with nonpositional hypopneas. CPAP titration to 12 CWP, AHI 2.4 per hour. On nocturnal CPAP via a small resMed Quattro full-face mask with heated humidifier.    History of pneumonia    "once"  (02/19/2018)   History of seborrheic keratosis 09/28/2015   History of stroke without residual deficits  01/17/2022   Hyperlipidemia LDL goal < 100 11/20/2005   Internal and external hemorrhoids without complication 08/04/2012   Lesion of left native kidney 06/01/2020   Incidental finding on recent screening chest CT for lung ca ordered by Dr. Linder Revere pulmonology - he has ordered a f/u renal U/S.    Lichen sclerosus of female genitalia 01/12/2017   Migraine    "none in years" (02/19/2018)   Mild cognitive impairment with memory loss 12/23/2021   Moderately severe major depression (HCC) 11/19/2005   Nocturnal hypoxia per sleep study 07/2019    Osteoporosis    DEXA 2016: T -2.7; DEXA (12/09/2011): L-spine T -3.7, left hip T -1.4 DEXA (12/2004): L-spine T -2.6, left hip -0.1    Paroxysmal atrial fibrillation (HCC) 10/22/2010   s/p Left atrial maze procedure for paroxysmal atrial fibrillation on 09/20/2010 by Dr Matt Song.  Subsequent splenic infarct, decision was made to re-anticoagulate with coumadin , likely life-long as this is the most likely cause of the splenic infarct.    Personal history of colonic polyps 05/14/2011   Colonoscopy (05/2011): 4 mm adenomatous polyp excised endoscopically Colonoscopy (02/2002): Adenomatous polyp excised endoscopically    Personal history of renal cell carcinoma 09/12/2020   Pneumonia    Pulmonary hypertension due to chronic obstructive pulmonary disease (HCC) 04/25/2016   2014 TEE w PA peak pressure 46 mmHg, s/p MV replacement    Right nephrolithiasis, asymptomatic, incidental finding 09/06/2014   5 mm non-obstructing calculus seen on CT scan 09/05/2014    Right ventricular failure (HCC) 04/25/2016   Severe obesity (BMI 35.0-39.9) with comorbidity (HCC) 10/23/2011   Sleep apnea    Stage 2 chronic kidney disease due to type 2 diabetes mellitus (HCC) 12/16/2018   CKD stage III     Tobacco abuse 07/28/2012   Type 2 diabetes mellitus with complication, with long-term current use of insulin  (HCC)    Type 2 diabetes mellitus with diabetic neuropathy (HCC)    Weight  loss due to medication 03/12/2023    Medications:  Scheduled:   acetaminophen   1,000 mg Oral Once   ALPRAZolam   0.5 mg Oral QHS   budesonide -glycopyrrolate -formoterol   2 puff Inhalation BID   buPROPion   150 mg Oral BH-q7a   carvedilol   6.25 mg Oral BID WC   diclofenac  Sodium  4 g Topical TID AC & HS   furosemide   40 mg Oral Daily   gabapentin   600 mg Oral QHS   insulin  aspart  0-15 Units Subcutaneous TID WC   insulin  aspart  0-5 Units Subcutaneous QHS   insulin  aspart  4 Units Subcutaneous TID WC   insulin  glargine-yfgn  40 Units Subcutaneous Daily   lidocaine   1 patch Transdermal Q24H   linaclotide   72 mcg Oral QAC breakfast   multivitamin with minerals  1 tablet Oral Daily   nicotine   14 mg Transdermal Daily   pantoprazole   40 mg Oral Daily   Ensure Max Protein  11 oz Oral Daily   ramelteon   8 mg Oral QHS   sacubitril -valsartan   1 tablet Oral BID   spironolactone   50 mg Oral Daily   venlafaxine  XR  150 mg Oral Q breakfast   Warfarin - Pharmacist Dosing Inpatient   Does not apply q1600    Assessment: INR 2.6 at 0241 hours today. Goal of Therapy:  INR target range as per PCP, Dr. Concha Deed A. Broadus Canes is 2.0 - 2.5.   Plan:  5 warfarin at 1600 today. Continued surveillance for signs/symptoms of bleeding or embolic events.  Kenda Paula, PharmD, CPP Clinical Pharmacist Practitioner 07/01/2023,6:39 AM

## 2023-07-01 NOTE — Progress Notes (Signed)
 Mobility Specialist Progress Note:   07/01/23 1200  Mobility  Activity Ambulated with assistance in hallway  Level of Assistance Contact guard assist, steadying assist  Assistive Device None  Distance Ambulated (ft) 200 ft  Activity Response Tolerated well  Mobility Referral Yes  Mobility visit 1 Mobility  Mobility Specialist Start Time (ACUTE ONLY) 1200  Mobility Specialist Stop Time (ACUTE ONLY) 1210  Mobility Specialist Time Calculation (min) (ACUTE ONLY) 10 min   Pt agreeable to mobility session. Required only CGA for steadying throughout ambulation with no AD use. No c/o throughout, pt back on EOB with all needs met, alarm on.   [place] Big Mobility Specialist Please contact via SecureChat or  Rehab office at [phone]

## 2023-07-02 ENCOUNTER — Encounter: Admitting: Internal Medicine

## 2023-07-02 DIAGNOSIS — J449 Chronic obstructive pulmonary disease, unspecified: Secondary | ICD-10-CM | POA: Diagnosis not present

## 2023-07-02 DIAGNOSIS — I5030 Unspecified diastolic (congestive) heart failure: Secondary | ICD-10-CM | POA: Diagnosis not present

## 2023-07-02 DIAGNOSIS — I059 Rheumatic mitral valve disease, unspecified: Secondary | ICD-10-CM | POA: Diagnosis not present

## 2023-07-02 DIAGNOSIS — I4811 Longstanding persistent atrial fibrillation: Secondary | ICD-10-CM | POA: Diagnosis not present

## 2023-07-05 DIAGNOSIS — R41841 Cognitive communication deficit: Secondary | ICD-10-CM | POA: Diagnosis not present

## 2023-07-05 DIAGNOSIS — Z9181 History of falling: Secondary | ICD-10-CM | POA: Diagnosis not present

## 2023-07-05 DIAGNOSIS — J439 Emphysema, unspecified: Secondary | ICD-10-CM | POA: Diagnosis not present

## 2023-07-05 DIAGNOSIS — R2689 Other abnormalities of gait and mobility: Secondary | ICD-10-CM | POA: Diagnosis not present

## 2023-07-05 DIAGNOSIS — M6281 Muscle weakness (generalized): Secondary | ICD-10-CM | POA: Diagnosis not present

## 2023-07-06 ENCOUNTER — Emergency Department (HOSPITAL_COMMUNITY)
Admission: EM | Admit: 2023-07-06 | Discharge: 2023-07-07 | Disposition: A | Discharge status: Home / Self Care | Attending: Emergency Medicine | Admitting: Emergency Medicine

## 2023-07-06 ENCOUNTER — Emergency Department (HOSPITAL_COMMUNITY)

## 2023-07-06 ENCOUNTER — Encounter (HOSPITAL_COMMUNITY): Payer: Self-pay

## 2023-07-06 ENCOUNTER — Other Ambulatory Visit: Payer: Self-pay

## 2023-07-06 DIAGNOSIS — Z7901 Long term (current) use of anticoagulants: Secondary | ICD-10-CM | POA: Diagnosis not present

## 2023-07-06 DIAGNOSIS — E1142 Type 2 diabetes mellitus with diabetic polyneuropathy: Secondary | ICD-10-CM | POA: Insufficient documentation

## 2023-07-06 DIAGNOSIS — Z043 Encounter for examination and observation following other accident: Secondary | ICD-10-CM | POA: Diagnosis not present

## 2023-07-06 DIAGNOSIS — S0083XA Contusion of other part of head, initial encounter: Secondary | ICD-10-CM | POA: Insufficient documentation

## 2023-07-06 DIAGNOSIS — S199XXA Unspecified injury of neck, initial encounter: Secondary | ICD-10-CM | POA: Diagnosis not present

## 2023-07-06 DIAGNOSIS — Y92009 Unspecified place in unspecified non-institutional (private) residence as the place of occurrence of the external cause: Secondary | ICD-10-CM | POA: Insufficient documentation

## 2023-07-06 DIAGNOSIS — J4489 Other specified chronic obstructive pulmonary disease: Secondary | ICD-10-CM | POA: Diagnosis not present

## 2023-07-06 DIAGNOSIS — E1122 Type 2 diabetes mellitus with diabetic chronic kidney disease: Secondary | ICD-10-CM | POA: Insufficient documentation

## 2023-07-06 DIAGNOSIS — S0990XA Unspecified injury of head, initial encounter: Secondary | ICD-10-CM | POA: Diagnosis not present

## 2023-07-06 DIAGNOSIS — W19XXXA Unspecified fall, initial encounter: Secondary | ICD-10-CM | POA: Insufficient documentation

## 2023-07-06 DIAGNOSIS — J439 Emphysema, unspecified: Secondary | ICD-10-CM | POA: Diagnosis not present

## 2023-07-06 DIAGNOSIS — I700 Atherosclerosis of aorta: Secondary | ICD-10-CM | POA: Diagnosis not present

## 2023-07-06 DIAGNOSIS — S61012A Laceration without foreign body of left thumb without damage to nail, initial encounter: Secondary | ICD-10-CM | POA: Diagnosis not present

## 2023-07-06 DIAGNOSIS — N183 Chronic kidney disease, stage 3 unspecified: Secondary | ICD-10-CM | POA: Insufficient documentation

## 2023-07-06 DIAGNOSIS — Z79899 Other long term (current) drug therapy: Secondary | ICD-10-CM | POA: Insufficient documentation

## 2023-07-06 DIAGNOSIS — I129 Hypertensive chronic kidney disease with stage 1 through stage 4 chronic kidney disease, or unspecified chronic kidney disease: Secondary | ICD-10-CM | POA: Insufficient documentation

## 2023-07-06 DIAGNOSIS — I10 Essential (primary) hypertension: Secondary | ICD-10-CM | POA: Diagnosis not present

## 2023-07-06 DIAGNOSIS — Z794 Long term (current) use of insulin: Secondary | ICD-10-CM | POA: Insufficient documentation

## 2023-07-06 DIAGNOSIS — Z85528 Personal history of other malignant neoplasm of kidney: Secondary | ICD-10-CM | POA: Diagnosis not present

## 2023-07-06 DIAGNOSIS — Z7951 Long term (current) use of inhaled steroids: Secondary | ICD-10-CM | POA: Diagnosis not present

## 2023-07-06 DIAGNOSIS — I6523 Occlusion and stenosis of bilateral carotid arteries: Secondary | ICD-10-CM | POA: Diagnosis not present

## 2023-07-06 DIAGNOSIS — S0003XA Contusion of scalp, initial encounter: Secondary | ICD-10-CM | POA: Diagnosis not present

## 2023-07-06 DIAGNOSIS — S6992XA Unspecified injury of left wrist, hand and finger(s), initial encounter: Secondary | ICD-10-CM | POA: Diagnosis present

## 2023-07-06 DIAGNOSIS — G4489 Other headache syndrome: Secondary | ICD-10-CM | POA: Diagnosis not present

## 2023-07-06 LAB — CBC WITH DIFFERENTIAL/PLATELET
Abs Immature Granulocytes: 0.03 10*3/uL (ref 0.00–0.07)
Basophils Absolute: 0.1 10*3/uL (ref 0.0–0.1)
Basophils Relative: 1 %
Eosinophils Absolute: 0.2 10*3/uL (ref 0.0–0.5)
Eosinophils Relative: 2 %
HCT: 37.3 % (ref 36.0–46.0)
Hemoglobin: 10.9 g/dL — ABNORMAL LOW (ref 12.0–15.0)
Immature Granulocytes: 0 %
Lymphocytes Relative: 17 %
Lymphs Abs: 1.6 10*3/uL (ref 0.7–4.0)
MCH: 25.7 pg — ABNORMAL LOW (ref 26.0–34.0)
MCHC: 29.2 g/dL — ABNORMAL LOW (ref 30.0–36.0)
MCV: 88 fL (ref 80.0–100.0)
Monocytes Absolute: 0.8 10*3/uL (ref 0.1–1.0)
Monocytes Relative: 8 %
Neutro Abs: 6.7 10*3/uL (ref 1.7–7.7)
Neutrophils Relative %: 72 %
Platelets: 307 10*3/uL (ref 150–400)
RBC: 4.24 MIL/uL (ref 3.87–5.11)
RDW: 26 % — ABNORMAL HIGH (ref 11.5–15.5)
WBC: 9.4 10*3/uL (ref 4.0–10.5)
nRBC: 0 % (ref 0.0–0.2)

## 2023-07-06 LAB — I-STAT CHEM 8, ED
BUN: 31 mg/dL — ABNORMAL HIGH (ref 8–23)
Calcium, Ion: 1.23 mmol/L (ref 1.15–1.40)
Chloride: 105 mmol/L (ref 98–111)
Creatinine, Ser: 1.1 mg/dL — ABNORMAL HIGH (ref 0.44–1.00)
Glucose, Bld: 141 mg/dL — ABNORMAL HIGH (ref 70–99)
HCT: 35 % — ABNORMAL LOW (ref 36.0–46.0)
Hemoglobin: 11.9 g/dL — ABNORMAL LOW (ref 12.0–15.0)
Potassium: 4.7 mmol/L (ref 3.5–5.1)
Sodium: 139 mmol/L (ref 135–145)
TCO2: 26 mmol/L (ref 22–32)

## 2023-07-06 LAB — PROTIME-INR
INR: 4 — ABNORMAL HIGH (ref 0.8–1.2)
Prothrombin Time: 38.9 s — ABNORMAL HIGH (ref 11.4–15.2)

## 2023-07-06 LAB — COMPREHENSIVE METABOLIC PANEL WITH GFR
ALT: 18 U/L (ref 0–44)
AST: 18 U/L (ref 15–41)
Albumin: 3.2 g/dL — ABNORMAL LOW (ref 3.5–5.0)
Alkaline Phosphatase: 45 U/L (ref 38–126)
Anion gap: 8 (ref 5–15)
BUN: 29 mg/dL — ABNORMAL HIGH (ref 8–23)
CO2: 26 mmol/L (ref 22–32)
Calcium: 9.5 mg/dL (ref 8.9–10.3)
Chloride: 105 mmol/L (ref 98–111)
Creatinine, Ser: 0.95 mg/dL (ref 0.44–1.00)
GFR, Estimated: >60 mL/min (ref >60)
Glucose, Bld: 141 mg/dL — ABNORMAL HIGH (ref 70–99)
Potassium: 4.6 mmol/L (ref 3.5–5.1)
Sodium: 139 mmol/L (ref 135–145)
Total Bilirubin: 0.5 mg/dL (ref 0.0–1.2)
Total Protein: 5.9 g/dL — ABNORMAL LOW (ref 6.5–8.1)

## 2023-07-06 MED ORDER — MORPHINE SULFATE (PF) 4 MG/ML IV SOLN
4.0000 mg | Freq: Once | INTRAVENOUS | Status: AC
  Administered 2023-07-06: 4 mg via INTRAVENOUS
  Filled 2023-07-06: qty 1

## 2023-07-06 MED ORDER — HYDROCODONE-ACETAMINOPHEN 5-325 MG PO TABS
2.0000 | ORAL_TABLET | Freq: Once | ORAL | Status: AC
  Administered 2023-07-06: 2 via ORAL
  Filled 2023-07-06: qty 2

## 2023-07-06 MED ORDER — ONDANSETRON HCL 4 MG/2ML IJ SOLN
4.0000 mg | Freq: Once | INTRAMUSCULAR | Status: AC
  Administered 2023-07-06: 4 mg via INTRAVENOUS
  Filled 2023-07-06: qty 2

## 2023-07-06 NOTE — Telephone Encounter (Signed)
 Patient calls from her SNF stating they had performed a FS POC INR = 5.3 on the dosing schema in her discharge summary (5mg  warfarin PO unit management by SNF provider took responsibility.) Advised to OMIT/HOLD warfarin today, have SNF contact on-call MD or Advanced Practice Provider and give subsequent warfarin dosing instructions. Advised to repeat INR tomorrow. No bleeding per patient.

## 2023-07-06 NOTE — ED Notes (Signed)
 Patient transported to CT

## 2023-07-06 NOTE — ED Provider Notes (Signed)
 [place] [facility] DEPARTMENT AT [facility] Provider Note   CSN: [id number] Arrival date & time: 07/06/23  1741     History {Add pertinent medical, surgical, social history, OB history to HPI:1} Chief Complaint  Patient presents with   Fall   Trauma    [name] is a 73 y.o. female with PMH as listed below who presents BIB EMS from [facility] for unwitnessed fall in [place]. Hit head on floor. Denies LOC/dizzy. Pt in c-ollar. On warfarin for afib.    Past Medical History:  Diagnosis Date   Abnormality of lung on CXR 02/02/2020   Nonspecific finding on CXR ordered by pulmonologist - c/w inflammation vs infection, f/u imaging suggested, Dr. Antonette Batters office has been in communication about recommended next steps.   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   Non-bleeding when visualized on capsule endoscopy 06/30/2015    Arthritis    "lower back; hands" (02/19/2018)   Asthma    Asymptomatic cholelithiasis 09/25/2015   Seen on CT scan 08/2015   Carotid artery stenosis; s/p R endarterectomy    s/p right endarterectomy (06/2010) Carotid US  (07/2010):  Left: Moderate-to-severe (60-79%) calcific and non-calcific plaque origin and proximal ICA and ECA    Chronic congestive heart failure with left ventricular diastolic dysfunction (HCC) 10/21/2010   Chronic constipation 02/03/2011   Chronic daily headache 01/16/2014   Chronic iron  deficiency anemia    Chronic low back pain 10/06/2012   Chronic venous insufficiency 08/04/2012   Closed fracture of one rib of left side 08/23/2021   ED visit after a fall 08/16/21    COPD (chronic obstructive pulmonary disease) with emphysema (HCC)    PFTs 2018: severe obstructive disease, insignif response to bronchiodilator, mild restriction parenchymal pattern, moderately severe diffusion defect. 2014   FEV1 0.92 (40%), ratio 69, 27% increase in FEV1 with BD, TLC 91%, severe airtrapping, DLCO49% On chronic home O2. Pulmonary rehab referral 05/2012    Dyspnea    Fibromyalgia 08/29/2010   Gastroesophageal reflux disease    History of blood transfusion    "several times"  (02/19/2018)   History of clear cell renal cell carcinoma (HCC), in remission 07/21/2011   s/p cryoablation of left RCC in 09/2011 by Dr. Nereida Banning. Followed by Dr. Joie Narrow  [facility]) .     History of fracture of left hip 10/17/2022   History of hiatal hernia    History of mitral valve replacement with bioprosthetic valve due to mitral stenosis 2012   s/p MVR with a 27-mm pericardial porcine valve (Medtronic Mosaic valve, serial #[id number] on 09/20/10, Dr. Matt Song)    History of obstructive sleep apnea, resolved 2013   resolved per sleep study 07/2019; no apnea, but did have desaturation.  CPAP no longer necessary.  Nocturnal polysomnography (06/2009): Moderate sleep apnea/ hypopnea syndrome , AHI 17.8 per hour with nonpositional hypopneas. CPAP titration to 12 CWP, AHI 2.4 per hour. On nocturnal CPAP via a small resMed Quattro full-face mask with heated humidifier.    History of pneumonia    "once"  (02/19/2018)   History of seborrheic keratosis 09/28/2015   History of stroke without residual deficits 01/17/2022   Hyperlipidemia LDL goal < 100 11/20/2005   Internal and external hemorrhoids without complication 08/04/2012   Lesion of left native kidney 06/01/2020   Incidental finding on recent screening chest CT for lung ca ordered by  Dr. Linder Revere pulmonology - he has ordered a f/u renal U/S.    Lichen sclerosus of female genitalia 01/12/2017   Migraine    "none in years" (02/19/2018)   Mild cognitive impairment with memory loss 12/23/2021   Moderately severe major depression (HCC) 11/19/2005   Nocturnal hypoxia per sleep study 07/2019    Osteoporosis    DEXA 2016: T -2.7; DEXA (12/09/2011): L-spine T -3.7, left hip T  -1.4 DEXA (12/2004): L-spine T -2.6, left hip -0.1    Paroxysmal atrial fibrillation (HCC) 10/22/2010   s/p Left atrial maze procedure for paroxysmal atrial fibrillation on 09/20/2010 by Dr Matt Song.  Subsequent splenic infarct, decision was made to re-anticoagulate with coumadin , likely life-long as this is the most likely cause of the splenic infarct.    Personal history of colonic polyps 05/14/2011   Colonoscopy (05/2011): 4 mm adenomatous polyp excised endoscopically Colonoscopy (02/2002): Adenomatous polyp excised endoscopically    Personal history of renal cell carcinoma 09/12/2020   Pneumonia    Pulmonary hypertension due to chronic obstructive pulmonary disease (HCC) 04/25/2016   2014 TEE w PA peak pressure 46 mmHg, s/p MV replacement    Right nephrolithiasis, asymptomatic, incidental finding 09/06/2014   5 mm non-obstructing calculus seen on CT scan 09/05/2014    Right ventricular failure (HCC) 04/25/2016   Severe obesity (BMI 35.0-39.9) with comorbidity (HCC) 10/23/2011   Sleep apnea    Stage 2 chronic kidney disease due to type 2 diabetes mellitus (HCC) 12/16/2018   CKD stage III     Tobacco abuse 07/28/2012   Type 2 diabetes mellitus with complication, with long-term current use of insulin  (HCC)    Type 2 diabetes mellitus with diabetic neuropathy (HCC)    Weight loss due to medication 03/12/2023       Home Medications Prior to Admission medications   Medication Sig Start Date End Date Taking? Authorizing Provider  albuterol  (VENTOLIN  HFA) 108 (90 Base) MCG/ACT inhaler Inhale 2 puffs into the lungs every 6 hours as needed for shortness of breath 01/21/23   Sherol Dixie, MD  ALPRAZolam  (XANAX ) 0.5 MG tablet Take 1 tablet (0.5 mg total) by mouth at bedtime. To help with sleep. 07/01/23   Ronni Colace, DO  buPROPion  (WELLBUTRIN  XL) 150 MG 24 hr tablet Take 1 tablet (150 mg total) by mouth every morning. 12/24/22   Sherol Dixie, MD  carvedilol  (COREG ) 6.25 MG tablet  Take 1 tablet (6.25 mg total) by mouth 2 (two) times daily with a meal. 07/01/23   Ronni Colace, DO  Continuous Glucose Sensor (FREESTYLE LIBRE 3 PLUS SENSOR) MISC Change sensor every 15 days. 04/29/23   Sherol Dixie, MD  diclofenac  Sodium (VOLTAREN ) 1 % GEL Apply 4 g topically 4 (four) times daily -  before meals and at bedtime. 07/01/23   Ronni Colace, DO  DULoxetine  (CYMBALTA ) 60 MG capsule Take 1 capsule (60 mg total) by mouth once for 1 dose. 07/01/23 07/01/23  Ronni Colace, DO  Fluticasone -Umeclidin-Vilant (TRELEGY ELLIPTA ) 100-62.5-25 MCG/ACT AEPB Inhale 1 puff into the lungs daily. 03/22/23   Lind Repine, MD  furosemide  (LASIX ) 40 MG tablet Take 1 tablet (40 mg total) by mouth daily. 07/01/23   Ronni Colace, DO  gabapentin  (NEURONTIN ) 600 MG tablet Take 1 tablet (600 mg total) by mouth at bedtime. 01/06/23   Sherol Dixie, MD  guaiFENesin  (MUCINEX ) 600 MG 12 hr tablet Take 1 tablet (600 mg total) by mouth 2 (two) times daily as needed for cough or  to loosen phlegm. 07/01/23   Ronni Colace, DO  HYDROcodone -acetaminophen  (NORCO/VICODIN) 5-325 MG tablet Take 2 tablets by mouth 2 (two) times daily. 07/01/23   Ronni Colace, DO  insulin  aspart (NOVOLOG ) 100 UNIT/ML injection Inject 4 Units into the skin 3 (three) times daily with meals. 07/01/23   Ronni Colace, DO  insulin  aspart (NOVOLOG ) 100 UNIT/ML injection Inject 0-15 Units into the skin 3 (three) times daily with meals. 07/01/23   Ronni Colace, DO  insulin  aspart (NOVOLOG ) 100 UNIT/ML injection Inject 0-5 Units into the skin at bedtime. 07/01/23   Ronni Colace, DO  insulin  degludec (TRESIBA ) 100 UNIT/ML FlexTouch Pen Inject 30 Units into the skin every morning. 04/30/23   Sherol Dixie, MD  insulin  glargine-yfgn (SEMGLEE ) 100 UNIT/ML injection Inject 0.4 mLs (40 Units total) into the skin daily. 07/01/23   Ronni Colace, DO  Insulin  Pen Needle 32G X 4 MM MISC Use to inject insulin  daily 05/18/23   Plyler, Donna M, RD   ipratropium (ATROVENT ) 0.03 % nasal spray Instill 2 sprays into the nose 3 (three) times daily as directed 02/18/23   Sherol Dixie, MD  ipratropium-albuterol  (DUONEB) 0.5-2.5 (3) MG/3ML SOLN Inhale 3 mLs by nebulization every 6 (six) hours as needed (shortness of breath, wheezing). 04/03/22   Sherol Dixie, MD  lidocaine  (LIDODERM ) 5 % Place 1 patch onto the skin daily. Remove & Discard patch within 12 hours or as directed by MD 07/01/23   Ronni Colace, DO  linaclotide  (LINZESS ) 72 MCG capsule Take 1 capsule (72 mcg total) by mouth daily before breakfast. 12/15/22 12/10/23  Sherol Dixie, MD  omeprazole  (PRILOSEC) 40 MG capsule Take 1 capsule (40 mg total) by mouth 2 (two) times daily. 01/29/23   Sherol Dixie, MD  ondansetron  (ZOFRAN ) 4 MG tablet Take 1 tablet (4 mg total) by mouth daily as needed for nausea or vomiting. 05/26/23 05/25/24  Sherol Dixie, MD  OXYGEN  Inhale 2 L into the lungs at bedtime.    [provider]  potassium chloride  (KLOR-CON  M) 10 MEQ tablet Take 1 tablet (10 mEq total) by mouth daily. 03/12/23   Sherol Dixie, MD  ramelteon  (ROZEREM ) 8 MG tablet Take 1 tablet (8 mg total) by mouth at bedtime. 06/10/23 06/09/24  Sherol Dixie, MD  sacubitril -valsartan  (ENTRESTO ) 24-26 MG Take 1 tablet by mouth 2 (two) times daily. 07/01/23   Ronni Colace, DO  spironolactone  (ALDACTONE ) 50 MG tablet Take 1 tablet (50 mg total) by mouth daily. 07/01/23   Ronni Colace, DO  venlafaxine  XR (EFFEXOR -XR) 150 MG 24 hr capsule Take 1 capsule (150 mg total) by mouth daily with breakfast. 04/07/23   Sherol Dixie, MD  warfarin (COUMADIN ) 5 MG tablet Take 1 tablet (5 mg total) by mouth one time only at 4 PM. 07/01/23   Ronni Colace, DO      Allergies    Dexilant [dexlansoprazole], Metoclopramide , Isovue  [iopamidol ], Lorazepam, Oxycontin  [oxycodone ], and Tramadol hcl    Review of Systems   Review of Systems A 10 point review of systems was  performed and is negative unless otherwise reported in HPI.  Physical Exam Updated Vital Signs BP (!) 145/62   Pulse 74   Temp 97.9 F (36.6 C) (Oral)   Resp 15   Ht 5\' 2"  (1.575 m)   Wt 58 kg   SpO2 95%   BMI 23.39 kg/m  Physical Exam General: Normal appearing {Desc; female/female:11659}, lying in bed.  HEENT: PERRLA, Sclera anicteric, MMM, trachea  midline.  Cardiology: RRR, no murmurs/rubs/gallops. BL radial and DP pulses equal bilaterally.  Resp: Normal respiratory rate and effort. CTAB, no wheezes, rhonchi, crackles.  Abd: Soft, non-tender, non-distended. No rebound tenderness or guarding.  GU: Deferred. MSK: No peripheral edema or signs of trauma. Extremities without deformity or TTP. No cyanosis or clubbing. Skin: warm, dry. No rashes or lesions. Back: No CVA tenderness Neuro: A&Ox4, CNs II-XII grossly intact. MAEs. Sensation grossly intact.  Psych: Normal mood and affect.   ED Results / Procedures / Treatments   Labs (all labs ordered are listed, but only abnormal results are displayed) Labs Reviewed  I-STAT CHEM 8, ED - Abnormal; Notable for the following components:      Result Value   BUN 31 (*)    Creatinine, Ser 1.10 (*)    Glucose, Bld 141 (*)    Hemoglobin 11.9 (*)    HCT 35.0 (*)    All other components within normal limits    EKG None  Radiology No results found.  Procedures Procedures  {Document cardiac monitor, telemetry assessment procedure when appropriate:1}  Medications Ordered in ED Medications - No data to display  ED Course/ Medical Decision Making/ A&P                          Medical Decision Making   This patient presents to the ED for concern of ***, this involves an extensive number of treatment options, and is a complaint that carries with it a high risk of complications and morbidity.  I considered the following differential and admission for this acute, potentially life threatening condition.   MDM:    ***     Labs: I  Ordered, and personally interpreted labs.  The pertinent results include:  ***  Imaging Studies ordered: I ordered imaging studies including *** I independently visualized and interpreted imaging. I agree with the radiologist interpretation  Additional history obtained from ***.  External records from outside source obtained and reviewed including ***  Cardiac Monitoring: The patient was maintained on a cardiac monitor.  I personally viewed and interpreted the cardiac monitored which showed an underlying rhythm of: ***  Reevaluation: After the interventions noted above, I reevaluated the patient and found that they have :{resolved/improved/worsened:23923::"improved"}  Social Determinants of Health: ***  Disposition:  ***  Co morbidities that complicate the patient evaluation  Past Medical History:  Diagnosis Date   Abnormality of lung on CXR 02/02/2020   Nonspecific finding on CXR ordered by pulmonologist - c/w inflammation vs infection, f/u imaging suggested, Dr. Antonette Batters office has been in communication about recommended next steps.   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   Non-bleeding when visualized on capsule endoscopy 06/30/2015    Arthritis    "lower back; hands" (02/19/2018)   Asthma    Asymptomatic cholelithiasis 09/25/2015   Seen on CT scan 08/2015   Carotid artery stenosis; s/p R endarterectomy    s/p right endarterectomy (06/2010) Carotid US  (07/2010):  Left: Moderate-to-severe (60-79%) calcific and non-calcific plaque origin and proximal ICA and ECA    Chronic congestive heart failure with left ventricular diastolic dysfunction (HCC) 10/21/2010   Chronic constipation 02/03/2011   Chronic daily headache 01/16/2014   Chronic iron  deficiency anemia    Chronic low back pain 10/06/2012   Chronic venous insufficiency 08/04/2012   Closed  fracture of one rib  of left side 08/23/2021   ED visit after a fall 08/16/21    COPD (chronic obstructive pulmonary disease) with emphysema (HCC)    PFTs 2018: severe obstructive disease, insignif response to bronchiodilator, mild restriction parenchymal pattern, moderately severe diffusion defect. 2014  FEV1 0.92 (40%), ratio 69, 27% increase in FEV1 with BD, TLC 91%, severe airtrapping, DLCO49% On chronic home O2. Pulmonary rehab referral 05/2012    Dyspnea    Fibromyalgia 08/29/2010   Gastroesophageal reflux disease    History of blood transfusion    "several times"  (02/19/2018)   History of clear cell renal cell carcinoma (HCC), in remission 07/21/2011   s/p cryoablation of left RCC in 09/2011 by Dr. Nereida Banning. Followed by Dr. Joie Narrow  [facility]) .     History of fracture of left hip 10/17/2022   History of hiatal hernia    History of mitral valve replacement with bioprosthetic valve due to mitral stenosis 2012   s/p MVR with a 27-mm pericardial porcine valve (Medtronic Mosaic valve, serial #[id number] on 09/20/10, Dr. Matt Song)    History of obstructive sleep apnea, resolved 2013   resolved per sleep study 07/2019; no apnea, but did have desaturation.  CPAP no longer necessary.  Nocturnal polysomnography (06/2009): Moderate sleep apnea/ hypopnea syndrome , AHI 17.8 per hour with nonpositional hypopneas. CPAP titration to 12 CWP, AHI 2.4 per hour. On nocturnal CPAP via a small resMed Quattro full-face mask with heated humidifier.    History of pneumonia    "once"  (02/19/2018)   History of seborrheic keratosis 09/28/2015   History of stroke without residual deficits 01/17/2022   Hyperlipidemia LDL goal < 100 11/20/2005   Internal and external hemorrhoids without complication 08/04/2012   Lesion of left native kidney 06/01/2020   Incidental finding on recent screening chest CT for lung ca ordered by Dr. Linder Revere pulmonology - he has ordered a f/u renal U/S.    Lichen sclerosus of female  genitalia 01/12/2017   Migraine    "none in years" (02/19/2018)   Mild cognitive impairment with memory loss 12/23/2021   Moderately severe major depression (HCC) 11/19/2005   Nocturnal hypoxia per sleep study 07/2019    Osteoporosis    DEXA 2016: T -2.7; DEXA (12/09/2011): L-spine T -3.7, left hip T -1.4 DEXA (12/2004): L-spine T -2.6, left hip -0.1    Paroxysmal atrial fibrillation (HCC) 10/22/2010   s/p Left atrial maze procedure for paroxysmal atrial fibrillation on 09/20/2010 by Dr Matt Song.  Subsequent splenic infarct, decision was made to re-anticoagulate with coumadin , likely life-long as this is the most likely cause of the splenic infarct.    Personal history of colonic polyps 05/14/2011   Colonoscopy (05/2011): 4 mm adenomatous polyp excised endoscopically Colonoscopy (02/2002): Adenomatous polyp excised endoscopically    Personal history of renal cell carcinoma 09/12/2020   Pneumonia    Pulmonary hypertension due to chronic obstructive pulmonary disease (HCC) 04/25/2016   2014 TEE w PA peak pressure 46 mmHg, s/p MV replacement    Right nephrolithiasis, asymptomatic, incidental finding 09/06/2014   5 mm non-obstructing calculus seen on CT scan 09/05/2014    Right ventricular failure (HCC) 04/25/2016   Severe obesity (BMI 35.0-39.9) with comorbidity (HCC) 10/23/2011   Sleep apnea    Stage 2 chronic kidney disease due to type 2 diabetes mellitus (HCC) 12/16/2018   CKD stage III     Tobacco abuse 07/28/2012   Type 2 diabetes mellitus with complication, with long-term current use of insulin  (HCC)  Type 2 diabetes mellitus with diabetic neuropathy (HCC)    Weight loss due to medication 03/12/2023     Medicines No orders of the defined types were placed in this encounter.   I have reviewed the patients home medicines and have made adjustments as needed  Problem List / ED Course: Problem List Items Addressed This Visit   None        {Document critical care time when  appropriate:1} {Document review of labs and clinical decision tools ie heart score, Chads2Vasc2 etc:1}  {Document your independent review of radiology images, and any outside records:1} {Document your discussion with family members, caretakers, and with consultants:1} {Document social determinants of health affecting pt's care:1} {Document your decision making why or why not admission, treatments were needed:1}  This note was created using dictation software, which may contain spelling or grammatical errors.

## 2023-07-06 NOTE — Discharge Instructions (Addendum)
 You were evaluated in the Emergency Department and after careful evaluation, we did not find any emergent condition requiring admission or further testing in the hospital.  Your exam/testing today is overall reassuring.  Your CT scan did not show any internal bleeding.  You have a large hematoma on your forehead that is causing pressure and pain.  We recommend that you receive additional Norco as needed for breakthrough pain.  You will have to discuss this with your doctors at your facility.  Continue Zofran  at home for nausea.  Please return to the Emergency Department if you experience any worsening of your condition.   Thank you for allowing us  to be a part of your care.

## 2023-07-06 NOTE — ED Notes (Signed)
 Patient placed in bed, awaiting transport home.

## 2023-07-06 NOTE — Progress Notes (Signed)
   07/06/23 1745  Spiritual Encounters  Type of Visit Initial  Care provided to: Patient  Referral source Trauma page  Reason for visit Trauma  OnCall Visit No   Chaplain responded to a level two trauma. The patient was attended to by the medical team. I spoke with the patient,[name], between medical procedures. She asked if her sister was in the waiting area. I checked she was not. I asked the security personnel to send her back to the patient's room when she arrives.  I advised the patient of this.  As I departed I advised her that if she would like to talk with a chaplain later to let her nurse know.   Clarence Croak [facility]  [phone]

## 2023-07-06 NOTE — ED Notes (Signed)
 After patient was moved to bed, MD rounded on patient. Patient stated nausea and headache. MD ordered new IV access with pain and nausea medications. Arrived to take patient back to facility, MD notified, MD stated patient is to stay in department to be re-evaluated. Adriana Hopping, Consulting civil engineer, aware.  Will continue to monitor.

## 2023-07-06 NOTE — ED Triage Notes (Signed)
 Pt BIB EMS from [facility] for unwitnessed fall in [place]. Hit head on floor. Denies LOC/dizzy. Pt in c-ollar. On warfarin for afib.

## 2023-07-07 ENCOUNTER — Emergency Department (HOSPITAL_COMMUNITY)

## 2023-07-07 DIAGNOSIS — Z7901 Long term (current) use of anticoagulants: Secondary | ICD-10-CM | POA: Diagnosis not present

## 2023-07-07 DIAGNOSIS — Z7401 Bed confinement status: Secondary | ICD-10-CM | POA: Diagnosis not present

## 2023-07-07 DIAGNOSIS — S0990XA Unspecified injury of head, initial encounter: Secondary | ICD-10-CM | POA: Diagnosis not present

## 2023-07-07 DIAGNOSIS — S0291XA Unspecified fracture of skull, initial encounter for closed fracture: Secondary | ICD-10-CM | POA: Diagnosis not present

## 2023-07-07 DIAGNOSIS — W19XXXA Unspecified fall, initial encounter: Secondary | ICD-10-CM | POA: Diagnosis not present

## 2023-07-07 DIAGNOSIS — S0003XA Contusion of scalp, initial encounter: Secondary | ICD-10-CM | POA: Diagnosis not present

## 2023-07-07 DIAGNOSIS — I482 Chronic atrial fibrillation, unspecified: Secondary | ICD-10-CM | POA: Diagnosis not present

## 2023-07-07 DIAGNOSIS — I6523 Occlusion and stenosis of bilateral carotid arteries: Secondary | ICD-10-CM | POA: Diagnosis not present

## 2023-07-07 DIAGNOSIS — I10 Essential (primary) hypertension: Secondary | ICD-10-CM | POA: Diagnosis not present

## 2023-07-07 MED ORDER — HYDROCODONE-ACETAMINOPHEN 5-325 MG PO TABS
1.0000 | ORAL_TABLET | Freq: Once | ORAL | Status: AC
  Administered 2023-07-07: 1 via ORAL
  Filled 2023-07-07: qty 1

## 2023-07-07 NOTE — ED Notes (Signed)
 Patient back from imaging, states no needs at this time.

## 2023-07-07 NOTE — ED Notes (Signed)
 ED Provider at bedside.

## 2023-07-07 NOTE — ED Notes (Signed)
 Patient transported to CT

## 2023-07-07 NOTE — ED Provider Notes (Signed)
  Provider Note MRN:  [id number]  Arrival date & time: 07/07/23    ED Course and Medical Decision Making  Assumed care of patient at sign-out or upon transfer.  Fall at home, anticoagulated, INR is supratherapeutic, initial CT head is unremarkable patient having worsening symptoms, nausea vomiting, continued headache.  Obtaining repeat CT head to exclude delayed bleeding, will reassess.  3 AM update: On my assessment patient is resting comfortably, wakes easily, very pleasant patient fully alert and oriented, conversant.  Still having a lot of pain in the area of the hematoma.  The repeat CT head is without evidence of internal bleeding but the hematoma is a bit larger compared to prior CT.  The skin is tight in this area and so I suspect her pain is related to this pressure.  She is tolerating p.o., remainder of her evaluation is reassuring.  No real indication for further testing or admission, patient is appropriate for discharge.  Procedures  Final Clinical Impressions(s) / ED Diagnoses     ICD-10-CM   1. Fall in home, initial encounter  W19.XXXA    Y92.009     2. Traumatic hematoma of forehead, initial encounter  W09.81XB       ED Discharge Orders     None         Discharge Instructions      You were evaluated in the Emergency Department and after careful evaluation, we did not find any emergent condition requiring admission or further testing in the hospital.  Your exam/testing today is overall reassuring.  Your CT scan did not show any internal bleeding.  You have a large hematoma on your forehead that is causing pressure and pain.  We recommend that you receive additional Norco as needed for breakthrough pain.  You will have to discuss this with your doctors at your facility.  Continue Zofran  at home for nausea.  Please return to the Emergency Department if you experience any worsening of your condition.   Thank you for allowing us  to be a part of your care.    Merrick Abe. Harless Lien, MD [facility] Emergency [facility] [email or web address]    Edson Graces, MD 07/07/23 [phone]

## 2023-07-08 NOTE — Progress Notes (Deleted)
 Cardiology Office Note:    Date:  07/08/2023   ID:  [name], DOB 1950-12-01, MRN [id number]  PCP:  Trudy Mliss Dragon, MD  Cardiologist:  Lonni LITTIE Nanas, MD Advanced Heart Failure:  Ezra Shuck, MD { Click to update primary MD,subspecialty MD or APP then REFRESH:1}    Referring MD: Trudy Mliss Dragon, MD   Chief Complaint: hospital follow-up of CHF  History of Present Illness:    [name] is a 73 y.o. female with a history of non-obstructive CAD on cardiac catheterization in 2018, chronic diastolic CHF with prominent RV failure in setting of severe COPD, paroxysmal atrial fibrillation on Coumadin , mitral stenosis s/p bioprosthetic mitral valve replacement in 2012, PAD with abnormal ABIs followed by Vascular Surgery, carotid artery diease s/p right CEA in 06/2010,  pulmonary hypertension (like group 2 and 3), hypertension, hyperlipidemia, type 2 diabetes mellitus with peripheral neuropathy, GERD, GI bleeds felt to be due to AVMs requiring blood transfusions in the past, iron  deficiency anemia, fibromyalgia, and chronic low back pain who is followed by Dr. Nanas and Dr. Shuck and presents today for hospital follow-up of CHF.  Patient as a history of mitral stenosis and underwent a bioprosthetic mitral valve replacement in 2012. She also has a history of paroxysmal atrial fibrillation and was on anticoagulation with Coumadin . She was admitted in 03/2016 with a GI bleed felt to be due to AVMs. Coumadin  was stopped and she required 2 units of PRBCs during that admission. Patient elected to stay of the Coumadin  at discharge with understanding that she was at an increased risk of having a stroke. Coumadin  was subsequently restarted. She was readmitted in 04/2016 with chest pain and dyspnea and found to have any elevated troponin. Echo showed LVEF of 60-65% with mild LVH, severely dilated RV with mildly reduced RV function, stable MVR, and severe pulmonary  hypertension with PASP of 76 mmHg. LHC showed non-obstructive CAD with only 30% stenosis of ostial left main and 40% stenosis of ostial RCA. RHC showed elevated right and left filling pressures and severe pulmonary hypertension. V/Q scan was negative for chronic PE and PFTs showed severe obstructive lung disease. She has been followed by the Advanced CHF team for chronic diastolic CHF with prominent RV failure secondary to severe COPD as well as severe pulmonary hypertension. Her pulmonary hypertension has been felt to primarily be group 2 (from LV diastolic dysfunction) and group 3 (from severe COPD and obstructive sleep apnea).   She also has a history of PAD with prior right CEA in 2012. She also has a history of severely reduced ABI on the right and mildly reduced ABI noted on dopplers in 2023. She has bilateral leg pain. She was seen by Dr. Serene for this but leg pain was felt to be due to peripheral neuropathy not claudication.   She has had multiple admission for non-cardiac issues (including recurrent symptomatic anemia, COPD exacerbations, and falls with one leading to a hip fracture) over the last several years. She was recently admitted admitted in 05/2023 for hypertensive urgency with associated atypical chest pain as well as COPD exacerbation. Echo showed LVEF of 60-65% with normal wall motion and mild LVH, moderately enlarged RV with moderately reduced RV function, moderate mitral stenosis, moderate aortic insufficiency/ mild aortic stenosis, and severe pulmonary hypertension. She was initially treated with IV Nitroglycerin  and oral antihypertensives were adjusted. She was then readmitted from 06/18/2023 to 07/01/2023 for an acute diastolic CHF after presenting with increasing shortness of  breath and lower extremity edema. Systolic BP was in the 190s on admission. BNP was 994. She was diuresed with IV Lasix  and started on Entresto  and Spironolactone . Lopressor  was also stopped and she was started on  Coreg . She was discharged back to [facility].   She was seen back in the ED on 07/06/2023 after a mechanical fall. She tripped on her slippers and hit her head on the floor. INR was supratherapeutic at 5. Head CT was negative and she was felt to be safe for discharge back home.  Patient presents today for follow-up. ***  Non-Obstructive CAD Noted on cardiac catheterization in 2018.  - No chest pain.  - Not on anticoagulation given need for full anticoagulation with Coumadin . - Not on a statin. ***  Chronic Diastolic CHF Predominant RV Failure Patient has a history of chronic diastolic CHF with predominant RV failure secondary to severe COPD. Last Echo in 05/2023 showed LVEF of 60-65% with normal wall motion and mild LVH, moderately enlarged RV with moderately reduced RV function, moderate mitral stenosis, moderate aortic insufficiency/ mild aortic stenosis, and severe pulmonary hypertension. She was recently admitted for acute CHF in 06/2023. - Euvolemic on exam. *** - Continue Lasix  40mg  daily.  - Continue Entresto  24-26mg  twice daily. - Continue Spironolactone  50mg  daily.  - Previously on Jardiance  but this was stopped due to a severe yeast infection. Per Dr. Orvilla last note, would not retry. - Discussed importance of daily weight and sodium/ fluid restrictions.  Paroxysmal Atrial Fibrillation Maintaining normal rhythm on exam. *** - Continue Coreg  6.25mg  twice daily. - Continue chronic anticoagulation with Coumadin .  Severe Pulmonary Hypertension Felt to primarily be group 2 (from LV diastolic dysfunction) and group 3 (from severe COPD and obstructive sleep apnea).  - Continue diuresis as above. - Continue treatment of COPD per primary team.  PAD Patient has a history of abnormal ABIs (right worse than left). Last ABIs in 05/2023 showed moderately reduced ABIs on the right (0.54) and normal ABIs on the left (0.99). Of note, previously severely reduced on the right and mildly  reduced on the left. - Followed by Dr. Serene (Vascular Surgery).  Carotid Artery Disease S/p right CEA in 2012. Last carotid ultrasound in 05/2023 showed patent right CEA with no significant stenosis and mild stenosis (1-39%) of left ICA. - Followed by Dr. Serene (Vascular Surgery).  Hypertension BP *** - Continue Entresto , Spironolactone , and Coreg  as above.  Hyperlipidemia Lipid panel in 06/2023: Total Cholesterol 150, Triglycerides 135, HDL 32, LDL 91. LDL goal <70 given CAD and PAD.  - Not on a statin. ***  Type 2 Diabetes Mellitus Hemoglobin A1c 5.9% in 06/2023. - On Insulin . - Management per PCP.  EKGs/Labs/Other Studies Reviewed:    The following studies were reviewed:  Left Cardiac Catheterization 04/21/2016: Ost RCA lesion, 40 %stenosed. Ost LM lesion, 30 %stenosed.  Impressions: Ms Skalsky has minimal CAD and normal LV function by 2-D echocardiogram. There was no culprit lesion. I suspect this was a non-STEMI secondary to stress-induced septal subendocardial ischemia potentially from anemia. The sheath was removed and a TR band was placed on the right wrist which is patent hemostasis. The patient left the lab in stable condition. She will most likely be discharged in the morning.  _______________  Right Cardiac Catheterization 04/23/2016: 1. Elevated right and left heart filling pressures.  2. Moderate to severe pulmonary hypertension.  This appears to be mixed pulmonary venous/pulmonary arterial hypertension.  Suspect group 2 + group 3 PH, ?component  of group 1.  She will have PFTs today, then consideration of selective pulmonary vasodilators if PH appears out of proportion to severity of COPD. _______________  Carotid Dopplers 05/14/2023: Summary: - Right Carotid: Patent CEA with no significant stenosis.  - Left Carotid: Velocities in the left ICA are consistent with a 1-39% stenosis.  - Vertebrals: Bilateral vertebral arteries demonstrate antegrade flow. -  Subclavians: Normal flow hemodynamics were seen in bilateral subclavian arteries.  _______________  ABIs/ TBIs 05/14/2023: Summary:  Right: Resting right ankle-brachial index indicates moderate right lower  extremity arterial disease. The right toe-brachial index is abnormal.   Left: Resting left ankle-brachial index is within normal range. The left  toe-brachial index is abnormal.  _______________  Echocardiogram 05/21/2023: Impressions:  1. Left ventricular ejection fraction, by estimation, is 60 to 65%. The  left ventricle has normal function. The left ventricle has no regional  wall motion abnormalities. There is mild concentric left ventricular  hypertrophy. Left ventricular diastolic  function could not be evaluated.   2. Right ventricular systolic function is moderately reduced. The right  ventricular size is moderately enlarged. There is severely elevated  pulmonary artery systolic pressure.   3. Left atrial size was severely dilated.   4. The mitral valve has been repaired/replaced. No evidence of mitral  valve regurgitation. Moderate mitral stenosis. There is a bioprosthetic  valve present in the mitral position.   5. The aortic valve is calcified. Aortic valve regurgitation is moderate.  Mild aortic valve stenosis. Aortic valve area, by VTI measures 1.71 cm.  Aortic valve mean gradient measures 10.3 mmHg. Aortic valve Vmax measures  2.34 m/s.   6. The inferior vena cava is normal in size with greater than 50%  respiratory variability, suggesting right atrial pressure of 3 mmHg.   EKG:  EKG not ordered today.   Recent Labs: 11/19/2022: TSH 1.004 06/18/2023: B Natriuretic Peptide 993.8 06/28/2023: Magnesium  2.5 07/06/2023: ALT 18; BUN 31; Creatinine, Ser 1.10; Hemoglobin 11.9; Platelets 307; Potassium 4.7; Sodium 139  Recent Lipid Panel    Component Value Date/Time   CHOL 150 06/23/2023 1046   CHOL 118 12/02/2022 1206   TRIG 135 06/23/2023 1046   HDL 32 (L) 06/23/2023  1046   HDL 33 (L) 12/02/2022 1206   CHOLHDL 4.7 06/23/2023 1046   VLDL 27 06/23/2023 1046   LDLCALC 91 06/23/2023 1046   LDLCALC 53 12/02/2022 1206    Physical Exam:    Vital Signs: There were no vitals taken for this visit.    Wt Readings from Last 3 Encounters:  07/06/23 127 lb 13.9 oz (58 kg)  07/01/23 129 lb 11.2 oz (58.8 kg)  06/18/23 146 lb 6.4 oz (66.4 kg)     General: 73 y.o. female in no acute distress. HEENT: Normocephalic and atraumatic. Sclera clear.  Neck: Supple. No carotid bruits. No JVD. Heart: *** RRR. Distinct S1 and S2. No murmurs, gallops, or rubs.  Lungs: No increased work of breathing. Clear to ausculation bilaterally. No wheezes, rhonchi, or rales.  Abdomen: Soft, non-distended, and non-tender to palpation.  Extremities: No lower extremity edema.  Radial and distal pedal pulses 2+ and equal bilaterally. Skin: Warm and dry. Neuro: No focal deficits. Psych: Normal affect. Responds appropriately.   Assessment:    No diagnosis found.  Plan:     Disposition: Follow up in ***   Signed, Aline FORBES Door, PA-C  07/08/2023 2:56 PM    [facility]

## 2023-07-15 ENCOUNTER — Other Ambulatory Visit: Payer: Self-pay

## 2023-07-15 ENCOUNTER — Encounter (HOSPITAL_COMMUNITY): Payer: Self-pay | Admitting: Emergency Medicine

## 2023-07-15 ENCOUNTER — Ambulatory Visit (INDEPENDENT_AMBULATORY_CARE_PROVIDER_SITE_OTHER): Admitting: Internal Medicine

## 2023-07-15 ENCOUNTER — Emergency Department (HOSPITAL_COMMUNITY)

## 2023-07-15 ENCOUNTER — Encounter: Payer: Self-pay | Admitting: Internal Medicine

## 2023-07-15 ENCOUNTER — Emergency Department (HOSPITAL_COMMUNITY)
Admission: EM | Admit: 2023-07-15 | Discharge: 2023-07-15 | Disposition: A | Discharge status: Home / Self Care | Attending: Student | Admitting: Student

## 2023-07-15 VITALS — BP 108/30 | HR 56 | Temp 97.8°F | Ht 62.0 in | Wt 140.5 lb

## 2023-07-15 DIAGNOSIS — Z85528 Personal history of other malignant neoplasm of kidney: Secondary | ICD-10-CM | POA: Insufficient documentation

## 2023-07-15 DIAGNOSIS — J45909 Unspecified asthma, uncomplicated: Secondary | ICD-10-CM | POA: Diagnosis not present

## 2023-07-15 DIAGNOSIS — I130 Hypertensive heart and chronic kidney disease with heart failure and stage 1 through stage 4 chronic kidney disease, or unspecified chronic kidney disease: Secondary | ICD-10-CM | POA: Insufficient documentation

## 2023-07-15 DIAGNOSIS — S2241XA Multiple fractures of ribs, right side, initial encounter for closed fracture: Secondary | ICD-10-CM | POA: Diagnosis not present

## 2023-07-15 DIAGNOSIS — E118 Type 2 diabetes mellitus with unspecified complications: Secondary | ICD-10-CM

## 2023-07-15 DIAGNOSIS — Z7951 Long term (current) use of inhaled steroids: Secondary | ICD-10-CM | POA: Insufficient documentation

## 2023-07-15 DIAGNOSIS — W19XXXA Unspecified fall, initial encounter: Secondary | ICD-10-CM | POA: Insufficient documentation

## 2023-07-15 DIAGNOSIS — I10 Essential (primary) hypertension: Secondary | ICD-10-CM

## 2023-07-15 DIAGNOSIS — N390 Urinary tract infection, site not specified: Secondary | ICD-10-CM | POA: Diagnosis not present

## 2023-07-15 DIAGNOSIS — I672 Cerebral atherosclerosis: Secondary | ICD-10-CM | POA: Diagnosis not present

## 2023-07-15 DIAGNOSIS — I6782 Cerebral ischemia: Secondary | ICD-10-CM | POA: Diagnosis not present

## 2023-07-15 DIAGNOSIS — N182 Chronic kidney disease, stage 2 (mild): Secondary | ICD-10-CM | POA: Insufficient documentation

## 2023-07-15 DIAGNOSIS — Z794 Long term (current) use of insulin: Secondary | ICD-10-CM | POA: Insufficient documentation

## 2023-07-15 DIAGNOSIS — F17200 Nicotine dependence, unspecified, uncomplicated: Secondary | ICD-10-CM

## 2023-07-15 DIAGNOSIS — Z79899 Other long term (current) drug therapy: Secondary | ICD-10-CM | POA: Insufficient documentation

## 2023-07-15 DIAGNOSIS — Z87891 Personal history of nicotine dependence: Secondary | ICD-10-CM | POA: Insufficient documentation

## 2023-07-15 DIAGNOSIS — I509 Heart failure, unspecified: Secondary | ICD-10-CM | POA: Insufficient documentation

## 2023-07-15 DIAGNOSIS — F17211 Nicotine dependence, cigarettes, in remission: Secondary | ICD-10-CM

## 2023-07-15 DIAGNOSIS — D509 Iron deficiency anemia, unspecified: Secondary | ICD-10-CM

## 2023-07-15 DIAGNOSIS — J449 Chronic obstructive pulmonary disease, unspecified: Secondary | ICD-10-CM | POA: Insufficient documentation

## 2023-07-15 DIAGNOSIS — I5033 Acute on chronic diastolic (congestive) heart failure: Secondary | ICD-10-CM | POA: Diagnosis not present

## 2023-07-15 DIAGNOSIS — S0003XA Contusion of scalp, initial encounter: Secondary | ICD-10-CM | POA: Diagnosis not present

## 2023-07-15 DIAGNOSIS — I700 Atherosclerosis of aorta: Secondary | ICD-10-CM | POA: Diagnosis not present

## 2023-07-15 DIAGNOSIS — E1122 Type 2 diabetes mellitus with diabetic chronic kidney disease: Secondary | ICD-10-CM | POA: Diagnosis not present

## 2023-07-15 DIAGNOSIS — S0083XA Contusion of other part of head, initial encounter: Secondary | ICD-10-CM | POA: Diagnosis not present

## 2023-07-15 DIAGNOSIS — I959 Hypotension, unspecified: Secondary | ICD-10-CM | POA: Insufficient documentation

## 2023-07-15 DIAGNOSIS — R400 Somnolence: Secondary | ICD-10-CM

## 2023-07-15 DIAGNOSIS — R531 Weakness: Secondary | ICD-10-CM | POA: Diagnosis not present

## 2023-07-15 LAB — URINALYSIS, ROUTINE W REFLEX MICROSCOPIC
Bilirubin Urine: NEGATIVE
Glucose, UA: NEGATIVE mg/dL
Hgb urine dipstick: NEGATIVE
Ketones, ur: NEGATIVE mg/dL
Nitrite: NEGATIVE
Protein, ur: NEGATIVE mg/dL
Specific Gravity, Urine: <1.005 — ABNORMAL LOW (ref 1.005–1.030)
pH: 6 (ref 5.0–8.0)

## 2023-07-15 LAB — CBC WITH DIFFERENTIAL/PLATELET
Abs Immature Granulocytes: 0.01 10*3/uL (ref 0.00–0.07)
Basophils Absolute: 0.1 10*3/uL (ref 0.0–0.1)
Basophils Relative: 1 %
Eosinophils Absolute: 0.1 10*3/uL (ref 0.0–0.5)
Eosinophils Relative: 3 %
HCT: 38.6 % (ref 36.0–46.0)
Hemoglobin: 10.9 g/dL — ABNORMAL LOW (ref 12.0–15.0)
Immature Granulocytes: 0 %
Lymphocytes Relative: 25 %
Lymphs Abs: 1.4 10*3/uL (ref 0.7–4.0)
MCH: 27.5 pg (ref 26.0–34.0)
MCHC: 28.2 g/dL — ABNORMAL LOW (ref 30.0–36.0)
MCV: 97.5 fL (ref 80.0–100.0)
Monocytes Absolute: 0.5 10*3/uL (ref 0.1–1.0)
Monocytes Relative: 10 %
Neutro Abs: 3.4 10*3/uL (ref 1.7–7.7)
Neutrophils Relative %: 61 %
Platelets: 202 10*3/uL (ref 150–400)
RBC: 3.96 MIL/uL (ref 3.87–5.11)
RDW: 25 % — ABNORMAL HIGH (ref 11.5–15.5)
Smear Review: NORMAL
WBC: 5.5 10*3/uL (ref 4.0–10.5)
nRBC: 0 % (ref 0.0–0.2)

## 2023-07-15 LAB — IRON AND TIBC
Iron: 63 ug/dL (ref 28–170)
Saturation Ratios: 22 % (ref 10.4–31.8)
TIBC: 290 ug/dL (ref 250–450)
UIBC: 227 ug/dL

## 2023-07-15 LAB — RAPID URINE DRUG SCREEN, HOSP PERFORMED
Amphetamines: NOT DETECTED
Barbiturates: NOT DETECTED
Benzodiazepines: NOT DETECTED
Cocaine: NOT DETECTED
Opiates: POSITIVE — AB
Tetrahydrocannabinol: NOT DETECTED

## 2023-07-15 LAB — COMPREHENSIVE METABOLIC PANEL WITH GFR
ALT: 15 U/L (ref 0–44)
AST: 15 U/L (ref 15–41)
Albumin: 3.1 g/dL — ABNORMAL LOW (ref 3.5–5.0)
Alkaline Phosphatase: 48 U/L (ref 38–126)
Anion gap: 11 (ref 5–15)
BUN: 26 mg/dL — ABNORMAL HIGH (ref 8–23)
CO2: 22 mmol/L (ref 22–32)
Calcium: 9.3 mg/dL (ref 8.9–10.3)
Chloride: 105 mmol/L (ref 98–111)
Creatinine, Ser: 1 mg/dL (ref 0.44–1.00)
GFR, Estimated: 59 mL/min — ABNORMAL LOW (ref >60)
Glucose, Bld: 116 mg/dL — ABNORMAL HIGH (ref 70–99)
Potassium: 4.2 mmol/L (ref 3.5–5.1)
Sodium: 138 mmol/L (ref 135–145)
Total Bilirubin: 0.5 mg/dL (ref 0.0–1.2)
Total Protein: 5.7 g/dL — ABNORMAL LOW (ref 6.5–8.1)

## 2023-07-15 LAB — URINALYSIS, MICROSCOPIC (REFLEX)

## 2023-07-15 LAB — PROTIME-INR
INR: 2 — ABNORMAL HIGH (ref 0.8–1.2)
Prothrombin Time: 23 s — ABNORMAL HIGH (ref 11.4–15.2)

## 2023-07-15 LAB — I-STAT CG4 LACTIC ACID, ED: Lactic Acid, Venous: 1 mmol/L (ref 0.5–1.9)

## 2023-07-15 LAB — GLUCOSE, CAPILLARY: Glucose-Capillary: 219 mg/dL — ABNORMAL HIGH (ref 70–99)

## 2023-07-15 LAB — FERRITIN: Ferritin: 178 ng/mL (ref 11–307)

## 2023-07-15 MED ORDER — SODIUM CHLORIDE 0.9 % IV BOLUS
1000.0000 mL | Freq: Once | INTRAVENOUS | Status: AC
  Administered 2023-07-15: 1000 mL via INTRAVENOUS

## 2023-07-15 MED ORDER — FOSFOMYCIN TROMETHAMINE 3 G PO PACK
3.0000 g | PACK | Freq: Once | ORAL | Status: AC
  Administered 2023-07-15: 3 g via ORAL
  Filled 2023-07-15: qty 3

## 2023-07-15 MED ORDER — SODIUM CHLORIDE 0.9 % IV BOLUS: 1000.0000 mL | Freq: Once | INTRAVENOUS | Status: DC

## 2023-07-15 MED ORDER — LACTATED RINGERS IV BOLUS: 1000.0000 mL | Freq: Once | INTRAVENOUS | Status: DC

## 2023-07-15 NOTE — ED Provider Notes (Signed)
 [place] EMERGENCY DEPARTMENT AT [facility] Provider Note  CSN: [id number] Arrival date & time: 07/15/23 1205  Chief Complaint(s) Hypotension and Fatigue  HPI [name] is a 73 y.o. female with PMH CHF, right carotid artery stenosis status post endarterectomy, mitral valve stenosis status post repair, paroxysmal A-fib on warfarin, T2DM, COPD/emphysema on 2 L nightly who presents emergency room for evaluation of somnolence.  Patient recently suffered a fall on 07/06/2023 and was found to have a supratherapeutic INR but no head bleed at that time.  Patient went to the internal medicine resident clinic today and reportedly was initially mildly hypotensive to 90/60 but this self resolved without intervention.  Orthostatic vital signs showed a low diastolic blood pressure but normal systolic.  Patient was reportedly falling asleep during the interview and was then transferred to the emergency department for further evaluation.  In the emergency room, patient is sleepy but will arouse to verbal stimuli and will answer questions appropriately.  Denies chest pain, shortness of breath, abdominal pain with nausea, vomiting or other systemic symptoms.   Past Medical History Past Medical History:  Diagnosis Date   Abnormality of lung on CXR 02/02/2020   Nonspecific finding on CXR ordered by pulmonologist - c/w inflammation vs infection, f/u imaging suggested, Dr. Antonette Batters office has been in communication about recommended next steps.   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   Non-bleeding when visualized on capsule endoscopy 06/30/2015    Arthritis    "lower back; hands" (02/19/2018)   Asthma    Asymptomatic cholelithiasis 09/25/2015   Seen on CT scan 08/2015   Carotid artery stenosis; s/p R endarterectomy    s/p right endarterectomy (06/2010) Carotid  US  (07/2010):  Left: Moderate-to-severe (60-79%) calcific and non-calcific plaque origin and proximal ICA and ECA    Chronic congestive heart failure with left ventricular diastolic dysfunction (HCC) 10/21/2010   Chronic constipation 02/03/2011   Chronic daily headache 01/16/2014   Chronic iron  deficiency anemia    Chronic low back pain 10/06/2012   Chronic venous insufficiency 08/04/2012   Closed fracture of one rib of left side 08/23/2021   ED visit after a fall 08/16/21    COPD (chronic obstructive pulmonary disease) with emphysema (HCC)    PFTs 2018: severe obstructive disease, insignif response to bronchiodilator, mild restriction parenchymal pattern, moderately severe diffusion defect. 2014  FEV1 0.92 (40%), ratio 69, 27% increase in FEV1 with BD, TLC 91%, severe airtrapping, DLCO49% On chronic home O2. Pulmonary rehab referral 05/2012    Dyspnea    Fibromyalgia 08/29/2010   Gastroesophageal reflux disease    History of blood transfusion    "several times"  (02/19/2018)   History of clear cell renal cell carcinoma (HCC), in remission 07/21/2011   s/p cryoablation of left RCC in 09/2011 by Dr. Nereida Banning. Followed by Dr. Joie Narrow  [facility]) .     History of fracture of left hip 10/17/2022   History of hiatal hernia    History of mitral valve replacement with bioprosthetic valve due to mitral stenosis 2012   s/p MVR with a 27-mm pericardial porcine valve (Medtronic Mosaic valve, serial #[id number] on 09/20/10, Dr. Matt Song)    History of obstructive sleep apnea, resolved 2013   resolved per sleep study 07/2019; no apnea, but did have desaturation.  CPAP no longer necessary.  Nocturnal polysomnography (06/2009): Moderate sleep apnea/ hypopnea syndrome , AHI  17.8 per hour with nonpositional hypopneas. CPAP titration to 12 CWP, AHI 2.4 per hour. On nocturnal CPAP via a small resMed Quattro full-face mask with heated humidifier.    History of pneumonia    "once"  (02/19/2018)   History of  seborrheic keratosis 09/28/2015   History of stroke without residual deficits 01/17/2022   Hyperlipidemia LDL goal < 100 11/20/2005   Internal and external hemorrhoids without complication 08/04/2012   Lesion of left native kidney 06/01/2020   Incidental finding on recent screening chest CT for lung ca ordered by Dr. Linder Revere pulmonology - he has ordered a f/u renal U/S.    Lichen sclerosus of female genitalia 01/12/2017   Migraine    "none in years" (02/19/2018)   Mild cognitive impairment with memory loss 12/23/2021   Moderately severe major depression (HCC) 11/19/2005   Nocturnal hypoxia per sleep study 07/2019    Osteoporosis    DEXA 2016: T -2.7; DEXA (12/09/2011): L-spine T -3.7, left hip T -1.4 DEXA (12/2004): L-spine T -2.6, left hip -0.1    Paroxysmal atrial fibrillation (HCC) 10/22/2010   s/p Left atrial maze procedure for paroxysmal atrial fibrillation on 09/20/2010 by Dr Matt Song.  Subsequent splenic infarct, decision was made to re-anticoagulate with coumadin , likely life-long as this is the most likely cause of the splenic infarct.    Personal history of colonic polyps 05/14/2011   Colonoscopy (05/2011): 4 mm adenomatous polyp excised endoscopically Colonoscopy (02/2002): Adenomatous polyp excised endoscopically    Personal history of renal cell carcinoma 09/12/2020   Pneumonia    Pulmonary hypertension due to chronic obstructive pulmonary disease (HCC) 04/25/2016   2014 TEE w PA peak pressure 46 mmHg, s/p MV replacement    Right nephrolithiasis, asymptomatic, incidental finding 09/06/2014   5 mm non-obstructing calculus seen on CT scan 09/05/2014    Right ventricular failure (HCC) 04/25/2016   Severe obesity (BMI 35.0-39.9) with comorbidity (HCC) 10/23/2011   Sleep apnea    Stage 2 chronic kidney disease due to type 2 diabetes mellitus (HCC) 12/16/2018   CKD stage III     Tobacco abuse 07/28/2012   Type 2 diabetes mellitus with complication, with long-term current use of insulin   (HCC)    Type 2 diabetes mellitus with diabetic neuropathy (HCC)    Weight loss due to medication 03/12/2023   Patient Active Problem List   Diagnosis Date Noted   Hypotension 07/15/2023   Fall 06/28/2023   S/P MVR (mitral valve replacement) 06/23/2023   Pulmonary hypertension (HCC) 06/20/2023   Malnutrition of moderate degree 06/19/2023   Acute exacerbation of chronic obstructive pulmonary disease (COPD) (HCC) 05/20/2023   Hypertensive urgency 05/20/2023   Iron  deficiency anemia 05/19/2023   IDA (iron  deficiency anemia) 05/19/2023   Atrial fibrillation (HCC) 12/23/2022   History of atrial fibrillation 10/18/2022   Chronic anticoagulation 10/18/2022   History of fracture of left hip 10/17/2022   Generalized pain 08/01/2022   Multifactorial functional impairment 02/13/2022   History of stroke without residual deficits 01/17/2022   Diabetic peripheral neuropathy associated with type 2 diabetes mellitus (HCC) 11/11/2021   Recurrent falls 11/11/2021   Gait instability 09/19/2021   Perennial non-allergic rhinitis 09/19/2021   Chronic, continuous use of opioids for chronic pain 07/10/2021   Polypharmacy 11/08/2020   Personal history of renal cell carcinoma 09/12/2020   Tinea pedis of both feet 08/07/2020   Essential tremor 11/17/2019   Nocturnal hypoxia per sleep study 07/2019    Carotid artery stenosis; s/p R endarterectomy  COPD mixed type (HCC)    Arthritis of both hands 07/01/2019   Stage 2 chronic kidney disease due to type 2 diabetes mellitus (HCC) 12/16/2018   Lichen sclerosus of female genitalia 01/12/2017   Mixed stress and urge urinary incontinence 05/27/2016   Pulmonary hypertension due to chronic obstructive pulmonary disease (HCC) 04/25/2016   Asymptomatic cholelithiasis 09/25/2015   Nausea 07/14/2015   Chronic iron  deficiency anemia 12/26/2014   Aortic atherosclerosis (HCC) 10/19/2014   Right nephrolithiasis, asymptomatic (incidental finding) 09/06/2014    Headache, chronic intermittent 01/14/2013   Chronic low back pain 10/06/2012   Long term current use of anticoagulant therapy 09/30/2012   Recurrent candidal intertrigo 07/28/2012   Tobacco use disorder 07/28/2012   History of colonic polyps 05/14/2011   Sleep disturbance 05/14/2011   Abdominal wall hernia 05/14/2011   Constipation due to opioid therapy 02/03/2011   Shortness of breath 12/06/2010   Health care maintenance 11/04/2010   PAF (paroxysmal atrial fibrillation) (HCC) 10/22/2010   Acute on chronic heart failure with preserved ejection fraction (HFpEF) (HCC) 10/21/2010   Fibromyalgia 08/29/2010   Gastroesophageal reflux disease    Type 2 diabetes mellitus with complication, with long-term current use of insulin  (HCC)    Osteoporosis with current pathological fracture with routine healing    Peripheral arterial disease (HCC) 05/27/2010   History of mitral valve replacement with bioprosthetic valve 2012   Hypertension 06/19/2008   Hyperlipidemia 11/20/2005   Severe major depression (HCC) 11/19/2005   Home Medication(s) Prior to Admission medications   Medication Sig Start Date End Date Taking? Authorizing Provider  albuterol  (VENTOLIN  HFA) 108 (90 Base) MCG/ACT inhaler Inhale 2 puffs into the lungs every 6 hours as needed for shortness of breath 01/21/23   Sherol Dixie, MD  ALPRAZolam  (XANAX ) 0.5 MG tablet Take 1 tablet (0.5 mg total) by mouth at bedtime. To help with sleep. 07/01/23   Ronni Colace, DO  buPROPion  (WELLBUTRIN  XL) 150 MG 24 hr tablet Take 1 tablet (150 mg total) by mouth every morning. 12/24/22   Sherol Dixie, MD  carvedilol  (COREG ) 6.25 MG tablet Take 1 tablet (6.25 mg total) by mouth 2 (two) times daily with a meal. 07/01/23   Ronni Colace, DO  Continuous Glucose Sensor (FREESTYLE LIBRE 3 PLUS SENSOR) MISC Change sensor every 15 days. 04/29/23   Sherol Dixie, MD  diclofenac  Sodium (VOLTAREN ) 1 % GEL Apply 4 g topically 4 (four) times daily  -  before meals and at bedtime. 07/01/23   Ronni Colace, DO  DULoxetine  (CYMBALTA ) 60 MG capsule Take 1 capsule (60 mg total) by mouth once for 1 dose. 07/01/23 07/01/23  Ronni Colace, DO  Fluticasone -Umeclidin-Vilant (TRELEGY ELLIPTA ) 100-62.5-25 MCG/ACT AEPB Inhale 1 puff into the lungs daily. 03/22/23   Lind Repine, MD  furosemide  (LASIX ) 40 MG tablet Take 1 tablet (40 mg total) by mouth daily. 07/01/23   Ronni Colace, DO  gabapentin  (NEURONTIN ) 600 MG tablet Take 1 tablet (600 mg total) by mouth at bedtime. 01/06/23   Sherol Dixie, MD  guaiFENesin  (MUCINEX ) 600 MG 12 hr tablet Take 1 tablet (600 mg total) by mouth 2 (two) times daily as needed for cough or to loosen phlegm. 07/01/23   Ronni Colace, DO  HYDROcodone -acetaminophen  (NORCO/VICODIN) 5-325 MG tablet Take 2 tablets by mouth 2 (two) times daily. 07/01/23   Ronni Colace, DO  insulin  aspart (NOVOLOG ) 100 UNIT/ML injection Inject 4 Units into the skin 3 (three) times daily with meals. 07/01/23   Ronni Colace,  DO  insulin  aspart (NOVOLOG ) 100 UNIT/ML injection Inject 0-15 Units into the skin 3 (three) times daily with meals. 07/01/23   Ronni Colace, DO  insulin  aspart (NOVOLOG ) 100 UNIT/ML injection Inject 0-5 Units into the skin at bedtime. 07/01/23   Ronni Colace, DO  insulin  degludec (TRESIBA ) 100 UNIT/ML FlexTouch Pen Inject 30 Units into the skin every morning. 04/30/23   Sherol Dixie, MD  insulin  glargine-yfgn (SEMGLEE ) 100 UNIT/ML injection Inject 0.4 mLs (40 Units total) into the skin daily. 07/01/23   Ronni Colace, DO  Insulin  Pen Needle 32G X 4 MM MISC Use to inject insulin  daily 05/18/23   Plyler, Donna M, RD  ipratropium (ATROVENT ) 0.03 % nasal spray Instill 2 sprays into the nose 3 (three) times daily as directed 02/18/23   Sherol Dixie, MD  ipratropium-albuterol  (DUONEB) 0.5-2.5 (3) MG/3ML SOLN Inhale 3 mLs by nebulization every 6 (six) hours as needed (shortness of breath, wheezing). 04/03/22   Sherol Dixie, MD  lidocaine  (LIDODERM ) 5 % Place 1 patch onto the skin daily. Remove & Discard patch within 12 hours or as directed by MD 07/01/23   Ronni Colace, DO  linaclotide  (LINZESS ) 72 MCG capsule Take 1 capsule (72 mcg total) by mouth daily before breakfast. 12/15/22 12/10/23  Sherol Dixie, MD  omeprazole  (PRILOSEC) 40 MG capsule Take 1 capsule (40 mg total) by mouth 2 (two) times daily. 01/29/23   Sherol Dixie, MD  ondansetron  (ZOFRAN ) 4 MG tablet Take 1 tablet (4 mg total) by mouth daily as needed for nausea or vomiting. 05/26/23 05/25/24  Sherol Dixie, MD  OXYGEN  Inhale 2 L into the lungs at bedtime.    [provider]  potassium chloride  (KLOR-CON  M) 10 MEQ tablet Take 1 tablet (10 mEq total) by mouth daily. 03/12/23   Sherol Dixie, MD  ramelteon  (ROZEREM ) 8 MG tablet Take 1 tablet (8 mg total) by mouth at bedtime. 06/10/23 06/09/24  Sherol Dixie, MD  sacubitril -valsartan  (ENTRESTO ) 24-26 MG Take 1 tablet by mouth 2 (two) times daily. 07/01/23   Ronni Colace, DO  spironolactone  (ALDACTONE ) 50 MG tablet Take 1 tablet (50 mg total) by mouth daily. 07/01/23   Ronni Colace, DO  venlafaxine  XR (EFFEXOR -XR) 150 MG 24 hr capsule Take 1 capsule (150 mg total) by mouth daily with breakfast. 04/07/23   Sherol Dixie, MD  warfarin (COUMADIN ) 5 MG tablet Take 1 tablet (5 mg total) by mouth one time only at 4 PM. 07/01/23   Ronni Colace, DO                                                                                                                                    Past Surgical History Past Surgical History:  Procedure Laterality Date   BIOPSY  07/19/2021   Procedure: BIOPSY;  Surgeon: Ozell Blunt, MD;  Location: [facility];  Service: Gastroenterology;;   CARDIAC CATHETERIZATION  CARDIAC VALVE REPLACEMENT  Aug. 2012   "mitral valve"   CAROTID ENDARTERECTOMY Right 07/04/2010   by Dr. [name] Cookey for asymptomatic right carotid artery stenosis    CATARACT EXTRACTION W/ INTRAOCULAR LENS  IMPLANT, BILATERAL Bilateral    CHEST TUBE INSERTION  09/24/2010   Dr Carloyn Chi Trigt   COLONOSCOPY  05/12/2011   performed by Dr. Honey Lusty. Showing small internal hemorrhoids, single tubular adenoma polyp   COLONOSCOPY WITH PROPOFOL  N/A 05/29/2020   Procedure: COLONOSCOPY WITH PROPOFOL ;  Surgeon: Ozell Blunt, MD;  Location: [facility] ENDOSCOPY;  Service: Endoscopy;  Laterality: N/A;   COLONOSCOPY WITH PROPOFOL  N/A 07/19/2021   Procedure: COLONOSCOPY WITH PROPOFOL ;  Surgeon: Ozell Blunt, MD;  Location: [facility] ENDOSCOPY;  Service: Gastroenterology;  Laterality: N/A;   CRYOABLATION Left 09/2011   by Dr. Nereida Banning. Followed by Dr. Joie Narrow  [facility]) .     DILATION AND CURETTAGE OF UTERUS     ESOPHAGOGASTRODUODENOSCOPY  05/12/2011   performed by Dr. Honey Lusty. Negative for ulcerations, biopsy negative for evidence of celiac sprue   ESOPHAGOGASTRODUODENOSCOPY N/A 06/29/2015   Procedure: ESOPHAGOGASTRODUODENOSCOPY (EGD);  Surgeon: Ozell Blunt, MD;  Location: [facility] ENDOSCOPY;  Service: Endoscopy;  Laterality: N/A;   ESOPHAGOGASTRODUODENOSCOPY N/A 03/29/2016   Procedure: ESOPHAGOGASTRODUODENOSCOPY (EGD);  Surgeon: Ozell Blunt, MD;  Location: [facility] ENDOSCOPY;  Service: Endoscopy;  Laterality: N/A;   ESOPHAGOGASTRODUODENOSCOPY (EGD) WITH PROPOFOL  N/A 05/29/2020   Procedure: ESOPHAGOGASTRODUODENOSCOPY (EGD) WITH PROPOFOL ;  Surgeon: Ozell Blunt, MD;  Location: [facility] ENDOSCOPY;  Service: Endoscopy;  Laterality: N/A;   FRACTURE SURGERY     GIVENS CAPSULE STUDY N/A 06/30/2015   Procedure: GIVENS CAPSULE STUDY;  Surgeon: Ozell Blunt, MD;  Location: [facility] ENDOSCOPY;  Service: Endoscopy;  Laterality: N/A;   GIVENS CAPSULE STUDY N/A 06/29/2015   Procedure: GIVENS CAPSULE STUDY;  Surgeon: Ozell Blunt, MD;  Location: [facility] ENDOSCOPY;  Service: Endoscopy;  Laterality: N/A;   HEMORRHOID SURGERY  1970s?   "lanced"   HYSTEROSCOPY W/ ENDOMETRIAL ABLATION  06/2001   for persistent post-menopausal bleeding // by S.  Saint Cranker, M.D.   INTRAMEDULLARY (IM) NAIL INTERTROCHANTERIC Left 10/19/2022   Procedure: LEFT INTRAMEDULLARY (IM) NAIL INTERTROCHANTERIC;  Surgeon: Gaylon Kea, MD;  Location: [facility] OR;  Service: Orthopedics;  Laterality: Left;   IR GENERIC HISTORICAL  08/23/2015   IR RADIOLOGIST EVAL & MGMT 08/23/2015 Erica Hau, MD [facility] INTERV RAD   IR GENERIC HISTORICAL  04/09/2016   IR RADIOLOGIST EVAL & MGMT 04/09/2016 Erica Hau, MD [facility] INTERV RAD   IR RADIOLOGIST EVAL & MGMT  10/07/2016   IR RADIOLOGIST EVAL & MGMT  06/25/2017   IR RADIOLOGIST EVAL & MGMT  07/31/2020   IR RADIOLOGIST EVAL & MGMT  10/02/2020   IR RADIOLOGIST EVAL & MGMT  12/20/2020   IR RADIOLOGIST EVAL & MGMT  12/25/2021   IR RADIOLOGIST EVAL & MGMT  04/16/2023   LEFT HEART CATH AND CORONARY ANGIOGRAPHY N/A 04/21/2016   Procedure: Left Heart Cath and Coronary Angiography;  Surgeon: Avanell Leigh, MD;  Location: [facility] INVASIVE CV LAB;  Service: Cardiovascular;  Laterality: N/A;   LIPOMA EXCISION  08/2005   occipital lipoma 1.5cm - by Dr. Corliss Dies   LITHOTRIPSY  ~ 2000   MAZE Left 09/20/10   for paroxysmal atrial fibrillation (Dr. Matt Song)   MITRAL VALVE REPLACEMENT  09/20/10    with a 27-mm pericardial porcine valve (Medtronic Mosaic valve, serial #[id number]). 09/20/10, Dr Matt Song   ORIF CLAVICLE FRACTURE Right 01/2004   by Myrtle Atta. Murrel Arnt, M.D for Right clavicle  nonunion.; "it's got a pin in it"   POLYPECTOMY  05/29/2020   Procedure: POLYPECTOMY;  Surgeon: Ozell Blunt, MD;  Location: [facility] ENDOSCOPY;  Service: Endoscopy;;   POLYPECTOMY  07/19/2021   Procedure: POLYPECTOMY;  Surgeon: Ozell Blunt, MD;  Location: [facility] ENDOSCOPY;  Service: Gastroenterology;;   RADIOLOGY WITH ANESTHESIA Left 09/12/2020   Procedure: RADIOLOGY WITH ANESTHESIA- RENAL CRYOABLATION;  Surgeon: Erica Hau, MD;  Location: [facility] ORS;  Service: Radiology;  Laterality: Left;   REFRACTIVE SURGERY Bilateral    RIGHT HEART CATH N/A 04/23/2016   Procedure: Right Heart  Cath;  Surgeon: Darlis Eisenmenger, MD;  Location: [facility] INVASIVE CV LAB;  Service: Cardiovascular;  Laterality: N/A;   TONSILLECTOMY     TUBAL LIGATION     Family History Family History  Problem Relation Age of Onset   Peptic Ulcer Disease Father    Heart attack Father 60       Died of MI at age 4   Heart attack Brother 26       Died of MI at age 21   Obesity Brother    Pneumonia Mother    Healthy Sister    Lupus Daughter    Obsessive Compulsive Disorder Daughter     Social History Social History   Tobacco Use   Smoking status: Every Day    Current packs/day: 1.00    Average packs/day: 1 pack/day for 60.7 years (60.7 ttl pk-yrs)    Types: Cigarettes    Start date: 11/01/1962   Smokeless tobacco: Never   Tobacco comments:    At least 1 PPD  Vaping Use   Vaping status: Never Used  Substance Use Topics   Alcohol use: Not Currently    Comment: 02/19/2018 "nothing since 1999"   Drug use: Not Currently    Types: Marijuana    Comment: 02/19/2018 "nothing since the 1990s"   Allergies Dexilant [dexlansoprazole], Metoclopramide , Isovue  [iopamidol ], Lorazepam, Oxycontin  [oxycodone ], and Tramadol hcl  Review of Systems Review of Systems  Constitutional:  Positive for fatigue.    Physical Exam Vital Signs  I have reviewed the triage vital signs BP (!) 187/44   Pulse (!) 56   Temp 97.8 F (36.6 C) (Oral)   Resp 20   Wt 64 kg   SpO2 98%   BMI 25.81 kg/m   Physical Exam Vitals and nursing note reviewed.  Constitutional:      General: She is not in acute distress.    Appearance: She is well-developed.  HENT:     Head: Normocephalic.     Comments: Bruising over the left side of the face, forehead hematoma Eyes:     Conjunctiva/sclera: Conjunctivae normal.  Cardiovascular:     Rate and Rhythm: Normal rate and regular rhythm.     Heart sounds: No murmur heard. Pulmonary:     Effort: Pulmonary effort is normal. No respiratory distress.     Breath sounds: Normal breath  sounds.  Abdominal:     Palpations: Abdomen is soft.     Tenderness: There is no abdominal tenderness.  Musculoskeletal:        General: No swelling.     Cervical back: Neck supple.  Skin:    General: Skin is warm and dry.     Capillary Refill: Capillary refill takes less than 2 seconds.  Neurological:     Mental Status: She is alert.  Psychiatric:        Mood and Affect: Mood normal.     ED Results and Treatments  Labs (all labs ordered are listed, but only abnormal results are displayed) Labs Reviewed  CBC WITH DIFFERENTIAL/PLATELET - Abnormal; Notable for the following components:      Result Value   Hemoglobin 10.9 (*)    MCHC 28.2 (*)    RDW 25.0 (*)    All other components within normal limits  COMPREHENSIVE METABOLIC PANEL WITH GFR - Abnormal; Notable for the following components:   Glucose, Bld 116 (*)    BUN 26 (*)    Total Protein 5.7 (*)    Albumin  3.1 (*)    GFR, Estimated 59 (*)    All other components within normal limits  PROTIME-INR - Abnormal; Notable for the following components:   Prothrombin  Time 23.0 (*)    INR 2.0 (*)    All other components within normal limits  IRON  AND TIBC  FERRITIN  URINALYSIS, ROUTINE W REFLEX MICROSCOPIC  RAPID URINE DRUG SCREEN, HOSP PERFORMED  I-STAT CG4 LACTIC ACID, ED  I-STAT CG4 LACTIC ACID, ED                                                                                                                          Radiology CT Head Wo Contrast Result Date: 07/15/2023 CLINICAL DATA:  Delirium.  Fatigue. EXAM: CT HEAD WITHOUT CONTRAST TECHNIQUE: Contiguous axial images were obtained from the base of the skull through the vertex without intravenous contrast. RADIATION DOSE REDUCTION: This exam was performed according to the departmental dose-optimization program which includes automated exposure control, adjustment of the mA and/or kV according to patient size and/or use of iterative reconstruction technique. COMPARISON:   07/07/2023 FINDINGS: Brain: No intracranial hemorrhage, mass effect, or midline shift. Stable brain volume. No hydrocephalus. The basilar cisterns are patent. Moderate periventricular and deep white matter hypodensity consistent with chronic small vessel ischemia. Remote lacunar infarct in the left caudate. Remote lacunar infarct versus prominent perivascular space in the low left basal ganglia. No evidence of territorial infarct or acute ischemia. No extra-axial or intracranial fluid collection. Vascular: Atherosclerosis of skullbase vasculature without hyperdense vessel or abnormal calcification. Skull: No fracture or focal lesion. Sinuses/Orbits: No acute finding. Other: Diminishing left frontal scalp hematoma. IMPRESSION: 1. No acute intracranial abnormality. 2. Chronic small vessel ischemia. 3. Diminishing left frontal scalp hematoma. Electronically Signed   By: Chadwick Colonel M.D.   On: 07/15/2023 17:44   DG Chest 1 View Result Date: 07/15/2023 CLINICAL DATA:  Weakness. EXAM: CHEST  1 VIEW COMPARISON:  07/06/2023. FINDINGS: The heart size and mediastinal contours are within normal limits. Prior median sternotomy. Aortic atherosclerosis. No focal consolidation, pleural effusion, or pneumothorax. Prior ORIF of the right clavicle. Remote healed right rib fractures. No acute osseous abnormality. IMPRESSION: 1. No acute cardiopulmonary findings. 2.  Aortic Atherosclerosis (ICD10-I70.0). Electronically Signed   By: Mannie Seek M.D.   On: 07/15/2023 14:03    Pertinent labs & imaging results that were available during my care of the patient were reviewed by me and  considered in my medical decision making (see MDM for details).  Medications Ordered in ED Medications  sodium chloride  0.9 % bolus 1,000 mL (1,000 mLs Intravenous New Bag/Given 07/15/23 1709)                                                                                                                                      Procedures Procedures  (including critical care time)  Medical Decision Making / ED Course   This patient presents to the ED for concern of hypotension, somnolence, this involves an extensive number of treatment options, and is a complaint that carries with it a high risk of complications and morbidity.  The differential diagnosis includes polypharmacy, dehydration, accidental overdose, bacteremia, CHF  MDM: Patient seen emergency room for evaluation of hypotension and somnolence.  Physical exam with bruising to the left side of the face and a forehead hematoma from her previous fall but is otherwise unremarkable.  Initial presenting blood pressure 130/43.  Laboratory evaluation is largely unremarkable outside of a hemoglobin of 10.9, albumin  3.1, INR is therapeutic at 2.0, lactic acid is normal, iron  studies is largely unremarkable.  Chest x-ray unremarkable.  CT head with no new traumatic injuries.  ECG without WPW or Brugada, no evidence of ischemia.  Patient fluid resuscitated and was observed for 8 hours in the emergency department.  Her somnolence appears to have resolved and she is alert answering questions appropriately.  Her diastolic blood pressure is improving with fluid resuscitation.  UDS is positive for opiates and urinalysis with small leuk esterase, 6-10 white blood cells and rare bacteria.  Will send for culture and treat empirically with fosfomycin.  Patient is able to ambulate without difficulty and is not orthostatic here in the Emergency Department.  Do suspect that there is likely an element of polypharmacy here as patient is on multiple sedative medications including opiates, gabapentin , Xanax , Remeron.  Will benefit from outpatient med rec.  At this time she does not meet inpatient criteria for admission will be discharged with outpatient follow-up.  Return precautions given which she voiced understanding.   Additional history obtained:  -External records from outside source  obtained and reviewed including: Chart review including previous notes, labs, imaging, consultation notes   Lab Tests: -I ordered, reviewed, and interpreted labs.   The pertinent results include:   Labs Reviewed  CBC WITH DIFFERENTIAL/PLATELET - Abnormal; Notable for the following components:      Result Value   Hemoglobin 10.9 (*)    MCHC 28.2 (*)    RDW 25.0 (*)    All other components within normal limits  COMPREHENSIVE METABOLIC PANEL WITH GFR - Abnormal; Notable for the following components:   Glucose, Bld 116 (*)    BUN 26 (*)    Total Protein 5.7 (*)    Albumin  3.1 (*)    GFR, Estimated 59 (*)    All other components within normal  limits  PROTIME-INR - Abnormal; Notable for the following components:   Prothrombin  Time 23.0 (*)    INR 2.0 (*)    All other components within normal limits  IRON  AND TIBC  FERRITIN  URINALYSIS, ROUTINE W REFLEX MICROSCOPIC  RAPID URINE DRUG SCREEN, HOSP PERFORMED  I-STAT CG4 LACTIC ACID, ED  I-STAT CG4 LACTIC ACID, ED      EKG   EKG Interpretation Date/Time:  Wednesday July 15 2023 15:29:38 EDT Ventricular Rate:  59 PR Interval:    QRS Duration:  108 QT Interval:  456 QTC Calculation: 452 R Axis:   -86  Text Interpretation: Atrial fibrillation LAD, consider left anterior fascicular block Anteroseptal infarct, old Confirmed by Jerald Molly [phone]) on 07/15/2023 3:44:53 PM         Imaging Studies ordered: I ordered imaging studies including chest x-ray, CT head I independently visualized and interpreted imaging. I agree with the radiologist interpretation   Medicines ordered and prescription drug management: Meds ordered this encounter  Medications   DISCONTD: sodium chloride  0.9 % bolus 1,000 mL   DISCONTD: lactated ringers  bolus 1,000 mL   sodium chloride  0.9 % bolus 1,000 mL    -I have reviewed the patients home medicines and have made adjustments as needed  Critical interventions none    Cardiac  Monitoring: The patient was maintained on a cardiac monitor.  I personally viewed and interpreted the cardiac monitored which showed an underlying rhythm of: Rate controlled A-fib  Social Determinants of Health:  Factors impacting patients care include: none   Reevaluation: After the interventions noted above, I reevaluated the patient and found that they have :improved  Co morbidities that complicate the patient evaluation  Past Medical History:  Diagnosis Date   Abnormality of lung on CXR 02/02/2020   Nonspecific finding on CXR ordered by pulmonologist - c/w inflammation vs infection, f/u imaging suggested, Dr. Antonette Batters office has been in communication about recommended next steps.   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   Non-bleeding when visualized on capsule endoscopy 06/30/2015    Arthritis    "lower back; hands" (02/19/2018)   Asthma    Asymptomatic cholelithiasis 09/25/2015   Seen on CT scan 08/2015   Carotid artery stenosis; s/p R endarterectomy    s/p right endarterectomy (06/2010) Carotid US  (07/2010):  Left: Moderate-to-severe (60-79%) calcific and non-calcific plaque origin and proximal ICA and ECA    Chronic congestive heart failure with left ventricular diastolic dysfunction (HCC) 10/21/2010   Chronic constipation 02/03/2011   Chronic daily headache 01/16/2014   Chronic iron  deficiency anemia    Chronic low back pain 10/06/2012   Chronic venous insufficiency 08/04/2012   Closed fracture of one rib of left side 08/23/2021   ED visit after a fall 08/16/21    COPD (chronic obstructive pulmonary disease) with emphysema (HCC)    PFTs 2018: severe obstructive disease, insignif response to bronchiodilator, mild restriction parenchymal pattern, moderately severe diffusion defect. 2014  FEV1 0.92 (40%), ratio 69, 27% increase in FEV1 with BD, TLC  91%, severe airtrapping, DLCO49% On chronic home O2. Pulmonary rehab referral 05/2012    Dyspnea    Fibromyalgia 08/29/2010   Gastroesophageal reflux disease    History of blood transfusion    "several times"  (02/19/2018)   History of clear cell renal cell carcinoma (HCC), in remission 07/21/2011   s/p cryoablation of left RCC in 09/2011  by Dr. Nereida Banning. Followed by Dr. Joie Narrow  [facility]) .     History of fracture of left hip 10/17/2022   History of hiatal hernia    History of mitral valve replacement with bioprosthetic valve due to mitral stenosis 2012   s/p MVR with a 27-mm pericardial porcine valve (Medtronic Mosaic valve, serial #[id number] on 09/20/10, Dr. Matt Song)    History of obstructive sleep apnea, resolved 2013   resolved per sleep study 07/2019; no apnea, but did have desaturation.  CPAP no longer necessary.  Nocturnal polysomnography (06/2009): Moderate sleep apnea/ hypopnea syndrome , AHI 17.8 per hour with nonpositional hypopneas. CPAP titration to 12 CWP, AHI 2.4 per hour. On nocturnal CPAP via a small resMed Quattro full-face mask with heated humidifier.    History of pneumonia    "once"  (02/19/2018)   History of seborrheic keratosis 09/28/2015   History of stroke without residual deficits 01/17/2022   Hyperlipidemia LDL goal < 100 11/20/2005   Internal and external hemorrhoids without complication 08/04/2012   Lesion of left native kidney 06/01/2020   Incidental finding on recent screening chest CT for lung ca ordered by Dr. Linder Revere pulmonology - he has ordered a f/u renal U/S.    Lichen sclerosus of female genitalia 01/12/2017   Migraine    "none in years" (02/19/2018)   Mild cognitive impairment with memory loss 12/23/2021   Moderately severe major depression (HCC) 11/19/2005   Nocturnal hypoxia per sleep study 07/2019    Osteoporosis    DEXA 2016: T -2.7; DEXA (12/09/2011): L-spine T -3.7, left hip T -1.4 DEXA (12/2004): L-spine T -2.6, left hip -0.1     Paroxysmal atrial fibrillation (HCC) 10/22/2010   s/p Left atrial maze procedure for paroxysmal atrial fibrillation on 09/20/2010 by Dr Matt Song.  Subsequent splenic infarct, decision was made to re-anticoagulate with coumadin , likely life-long as this is the most likely cause of the splenic infarct.    Personal history of colonic polyps 05/14/2011   Colonoscopy (05/2011): 4 mm adenomatous polyp excised endoscopically Colonoscopy (02/2002): Adenomatous polyp excised endoscopically    Personal history of renal cell carcinoma 09/12/2020   Pneumonia    Pulmonary hypertension due to chronic obstructive pulmonary disease (HCC) 04/25/2016   2014 TEE w PA peak pressure 46 mmHg, s/p MV replacement    Right nephrolithiasis, asymptomatic, incidental finding 09/06/2014   5 mm non-obstructing calculus seen on CT scan 09/05/2014    Right ventricular failure (HCC) 04/25/2016   Severe obesity (BMI 35.0-39.9) with comorbidity (HCC) 10/23/2011   Sleep apnea    Stage 2 chronic kidney disease due to type 2 diabetes mellitus (HCC) 12/16/2018   CKD stage III     Tobacco abuse 07/28/2012   Type 2 diabetes mellitus with complication, with long-term current use of insulin  (HCC)    Type 2 diabetes mellitus with diabetic neuropathy (HCC)    Weight loss due to medication 03/12/2023      Dispostion: I considered admission for this patient, but at this time she does not meet inpatient criteria for admission will be discharged with outpatient follow-up.     Final Clinical Impression(s) / ED Diagnoses Final diagnoses:  None     @PCDICTATION @    Karlyn Overman, MD 07/15/23 2046

## 2023-07-15 NOTE — ED Notes (Signed)
 Patient transported to X-ray

## 2023-07-15 NOTE — ED Triage Notes (Signed)
 Patient sent here by PCP for hypotension, generalized fatigue and inability to stay awake.  Patient falling asleep while taking temperature.  Patient gives verbal consent for MSE.

## 2023-07-15 NOTE — ED Notes (Signed)
 [facility] contacted regarding pt tx and discharge. Report was given to RN. Called disconnected prior to claification of RN's name. Facility was called back r

## 2023-07-15 NOTE — ED Notes (Signed)
 Pt made attempt to provide urine sample with no success.

## 2023-07-15 NOTE — ED Notes (Signed)
 [facility] contacted

## 2023-07-15 NOTE — Assessment & Plan Note (Signed)
 Patient does have 10 pound weight gain since her hospital discharge but does report a good appetite.  Overall no signs of volume overload and suspect this may be secondary to eating habits rather than fluid retention.

## 2023-07-15 NOTE — Addendum Note (Signed)
 Addended by: Manfred Seed on: 07/15/2023 04:58 PM   Modules accepted: Orders

## 2023-07-15 NOTE — Assessment & Plan Note (Signed)
 Patient states she has not smoked for 3 to 4 weeks.  Congratulated her on smoking cessation.

## 2023-07-15 NOTE — ED Notes (Signed)
 Patient transported to CT

## 2023-07-15 NOTE — ED Notes (Signed)
 Lab contacted to add urine culture.

## 2023-07-15 NOTE — Progress Notes (Addendum)
 CC: hospital follow up  HPI:  [name] is a 73 y.o. with medical history of HTN, HLD, HFpEF, pulmonary hypertension, and mitral valve replacement presenting to [facility] for a hospital follow up. She was hospitalized from 05/08-05/21 for HFpEF exacerbation. She also had an ED visit for a mechanical fall on 05/26.   Please see problem-based list for further details, assessments, and plans.  Past Medical History:  Diagnosis Date   Abnormality of lung on CXR 02/02/2020   Nonspecific finding on CXR ordered by pulmonologist - c/w inflammation vs infection, f/u imaging suggested, Dr. Antonette Batters office has been in communication about recommended next steps.   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   Non-bleeding when visualized on capsule endoscopy 06/30/2015    Arthritis    "lower back; hands" (02/19/2018)   Asthma    Asymptomatic cholelithiasis 09/25/2015   Seen on CT scan 08/2015   Carotid artery stenosis; s/p R endarterectomy    s/p right endarterectomy (06/2010) Carotid US  (07/2010):  Left: Moderate-to-severe (60-79%) calcific and non-calcific plaque origin and proximal ICA and ECA    Chronic congestive heart failure with left ventricular diastolic dysfunction (HCC) 10/21/2010   Chronic constipation 02/03/2011   Chronic daily headache 01/16/2014   Chronic iron  deficiency anemia    Chronic low back pain 10/06/2012   Chronic venous insufficiency 08/04/2012   Closed fracture of one rib of left side 08/23/2021   ED visit after a fall 08/16/21    COPD (chronic obstructive pulmonary disease) with emphysema (HCC)    PFTs 2018: severe obstructive disease, insignif response to bronchiodilator, mild restriction parenchymal pattern, moderately severe diffusion defect. 2014  FEV1 0.92 (40%), ratio 69, 27% increase in FEV1 with BD, TLC 91%, severe airtrapping,  DLCO49% On chronic home O2. Pulmonary rehab referral 05/2012    Dyspnea    Fibromyalgia 08/29/2010   Gastroesophageal reflux disease    History of blood transfusion    "several times"  (02/19/2018)   History of clear cell renal cell carcinoma (HCC), in remission 07/21/2011   s/p cryoablation of left RCC in 09/2011 by Dr. Nereida Banning. Followed by Dr. Joie Narrow  [facility]) .     History of fracture of left hip 10/17/2022   History of hiatal hernia    History of mitral valve replacement with bioprosthetic valve due to mitral stenosis 2012   s/p MVR with a 27-mm pericardial porcine valve (Medtronic Mosaic valve, serial #[id number] on 09/20/10, Dr. Matt Song)    History of obstructive sleep apnea, resolved 2013   resolved per sleep study 07/2019; no apnea, but did have desaturation.  CPAP no longer necessary.  Nocturnal polysomnography (06/2009): Moderate sleep apnea/ hypopnea syndrome , AHI 17.8 per hour with nonpositional hypopneas. CPAP titration to 12 CWP, AHI 2.4 per hour. On nocturnal CPAP via a small resMed Quattro full-face mask with heated humidifier.    History of pneumonia    "once"  (02/19/2018)   History of seborrheic keratosis 09/28/2015   History of stroke without residual deficits 01/17/2022   Hyperlipidemia LDL goal < 100 11/20/2005   Internal and external hemorrhoids without complication 08/04/2012   Lesion of left native kidney 06/01/2020   Incidental finding on recent screening chest CT for lung ca ordered by Dr. Linder Revere pulmonology - he has ordered a f/u renal U/S.    Lichen sclerosus of female genitalia 01/12/2017   Migraine    "  none in years" (02/19/2018)   Mild cognitive impairment with memory loss 12/23/2021   Moderately severe major depression (HCC) 11/19/2005   Nocturnal hypoxia per sleep study 07/2019    Osteoporosis    DEXA 2016: T -2.7; DEXA (12/09/2011): L-spine T -3.7, left hip T -1.4 DEXA (12/2004): L-spine T -2.6, left hip -0.1    Paroxysmal atrial fibrillation  (HCC) 10/22/2010   s/p Left atrial maze procedure for paroxysmal atrial fibrillation on 09/20/2010 by Dr Matt Song.  Subsequent splenic infarct, decision was made to re-anticoagulate with coumadin , likely life-long as this is the most likely cause of the splenic infarct.    Personal history of colonic polyps 05/14/2011   Colonoscopy (05/2011): 4 mm adenomatous polyp excised endoscopically Colonoscopy (02/2002): Adenomatous polyp excised endoscopically    Personal history of renal cell carcinoma 09/12/2020   Pneumonia    Pulmonary hypertension due to chronic obstructive pulmonary disease (HCC) 04/25/2016   2014 TEE w PA peak pressure 46 mmHg, s/p MV replacement    Right nephrolithiasis, asymptomatic, incidental finding 09/06/2014   5 mm non-obstructing calculus seen on CT scan 09/05/2014    Right ventricular failure (HCC) 04/25/2016   Severe obesity (BMI 35.0-39.9) with comorbidity (HCC) 10/23/2011   Sleep apnea    Stage 2 chronic kidney disease due to type 2 diabetes mellitus (HCC) 12/16/2018   CKD stage III     Tobacco abuse 07/28/2012   Type 2 diabetes mellitus with complication, with long-term current use of insulin  (HCC)    Type 2 diabetes mellitus with diabetic neuropathy (HCC)    Weight loss due to medication 03/12/2023    Current Outpatient Medications (Endocrine & Metabolic):    insulin  aspart (NOVOLOG ) 100 UNIT/ML injection, Inject 4 Units into the skin 3 (three) times daily with meals.   insulin  aspart (NOVOLOG ) 100 UNIT/ML injection, Inject 0-15 Units into the skin 3 (three) times daily with meals.   insulin  aspart (NOVOLOG ) 100 UNIT/ML injection, Inject 0-5 Units into the skin at bedtime.   insulin  degludec (TRESIBA ) 100 UNIT/ML FlexTouch Pen, Inject 30 Units into the skin every morning.   insulin  glargine-yfgn (SEMGLEE ) 100 UNIT/ML injection, Inject 0.4 mLs (40 Units total) into the skin daily.  Current Outpatient Medications (Cardiovascular):    carvedilol  (COREG ) 6.25 MG  tablet, Take 1 tablet (6.25 mg total) by mouth 2 (two) times daily with a meal.   furosemide  (LASIX ) 40 MG tablet, Take 1 tablet (40 mg total) by mouth daily.   sacubitril -valsartan  (ENTRESTO ) 24-26 MG, Take 1 tablet by mouth 2 (two) times daily.   spironolactone  (ALDACTONE ) 50 MG tablet, Take 1 tablet (50 mg total) by mouth daily.  Current Outpatient Medications (Respiratory):    albuterol  (VENTOLIN  HFA) 108 (90 Base) MCG/ACT inhaler, Inhale 2 puffs into the lungs every 6 hours as needed for shortness of breath   Fluticasone -Umeclidin-Vilant (TRELEGY ELLIPTA ) 100-62.5-25 MCG/ACT AEPB, Inhale 1 puff into the lungs daily.   guaiFENesin  (MUCINEX ) 600 MG 12 hr tablet, Take 1 tablet (600 mg total) by mouth 2 (two) times daily as needed for cough or to loosen phlegm.   ipratropium (ATROVENT ) 0.03 % nasal spray, Instill 2 sprays into the nose 3 (three) times daily as directed   ipratropium-albuterol  (DUONEB) 0.5-2.5 (3) MG/3ML SOLN, Inhale 3 mLs by nebulization every 6 (six) hours as needed (shortness of breath, wheezing).  Current Outpatient Medications (Analgesics):    HYDROcodone -acetaminophen  (NORCO/VICODIN) 5-325 MG tablet, Take 2 tablets by mouth 2 (two) times daily.  Current Outpatient Medications (Hematological):  warfarin (COUMADIN ) 5 MG tablet, Take 1 tablet (5 mg total) by mouth one time only at 4 PM.  Current Outpatient Medications (Other):    ALPRAZolam  (XANAX ) 0.5 MG tablet, Take 1 tablet (0.5 mg total) by mouth at bedtime. To help with sleep.   buPROPion  (WELLBUTRIN  XL) 150 MG 24 hr tablet, Take 1 tablet (150 mg total) by mouth every morning.   Continuous Glucose Sensor (FREESTYLE LIBRE 3 PLUS SENSOR) MISC, Change sensor every 15 days.   diclofenac  Sodium (VOLTAREN ) 1 % GEL, Apply 4 g topically 4 (four) times daily -  before meals and at bedtime.   DULoxetine  (CYMBALTA ) 60 MG capsule, Take 1 capsule (60 mg total) by mouth once for 1 dose.   gabapentin  (NEURONTIN ) 600 MG tablet,  Take 1 tablet (600 mg total) by mouth at bedtime.   Insulin  Pen Needle 32G X 4 MM MISC, Use to inject insulin  daily   lidocaine  (LIDODERM ) 5 %, Place 1 patch onto the skin daily. Remove & Discard patch within 12 hours or as directed by MD   linaclotide  (LINZESS ) 72 MCG capsule, Take 1 capsule (72 mcg total) by mouth daily before breakfast.   omeprazole  (PRILOSEC) 40 MG capsule, Take 1 capsule (40 mg total) by mouth 2 (two) times daily.   ondansetron  (ZOFRAN ) 4 MG tablet, Take 1 tablet (4 mg total) by mouth daily as needed for nausea or vomiting.   OXYGEN , Inhale 2 L into the lungs at bedtime.   [Paused] potassium chloride  (KLOR-CON  M) 10 MEQ tablet, Take 1 tablet (10 mEq total) by mouth daily.   ramelteon  (ROZEREM ) 8 MG tablet, Take 1 tablet (8 mg total) by mouth at bedtime.   venlafaxine  XR (EFFEXOR -XR) 150 MG 24 hr capsule, Take 1 capsule (150 mg total) by mouth daily with breakfast.  Review of Systems:  Review of system negative unless stated in the problem list or HPI.    Physical Exam:  Vitals:   07/15/23 1005 07/15/23 1015  BP: (!) 96/38 (!) 108/30  Pulse: 62 (!) 56  Temp: 97.8 F (36.6 C)   TempSrc: Oral   SpO2: 94%   Weight: 140 lb 8 oz (63.7 kg)   Height: 5\' 2"  (1.575 m)    Physical Exam General: NAD HENT: NCAT Lungs: CTAB, no wheeze, rhonchi or rales.  Cardiovascular: Braydcardic heart rate, irregular rhythm. Good radial pulse, no JVD Abdomen: No TTP, normal bowel sounds MSK: No asymmetry or muscle atrophy.  Skin: left side of her face with bruising, left should with bruising.  Neuro: Somnolent. Awakens easily and oriented x4. CN grossly intact Psych: Normal mood and normal affect  Assessment & Plan:   Hypotension Patient presented for a hospital follow-up.  She has history of multiple falls. She was hypotensive in the clinic.  Initial blood pressure was 96/38 and repeat shows improvement in systolic but very low diastolic blood pressure.  Patient has complicated  history and recent admission for heart failure exacerbation.  Exam overall does not show volume overload.  Differential diagnosis included hypovolemia versus iron  deficiency anemia versus medication induced.  Orthostatics were done that were limited due to patient's symptoms of dizziness and instability.  Her lying blood pressure was 130/33, sitting was 121/27 and standing was 114/37.  Concerning finding was that patient became sleepy during her interview.  Plan is to get stat labs to see what underlying etiology of her hypotension and sleepiness is. CBG was checked and was elevated ruling out hypoglycemia as the cause of her symptoms.  She is on chronic opioid therapy and AKI secondary to hypotensive could result in somnolence. Will send her to the ED for further evaluation where she will benefit from lab work including CBC, BMP, iron  studies.  Given her history of falls, her orthostasis increases that risk and she needs prompt evaluation for this.  Tobacco use disorder Patient states she has not smoked for 3 to 4 weeks.  Congratulated her on smoking cessation.  Acute on chronic heart failure with preserved ejection fraction (HFpEF) (HCC) Patient does have 10 pound weight gain since her hospital discharge but does report a good appetite.  Overall no signs of volume overload and suspect this may be secondary to eating habits rather than fluid retention.   See Encounters Tab for problem based charting.  Patient Discussed with Dr. Laurin Popp, MD Tommas Fragmin. [facility] Internal Medicine Residency, PGY-3

## 2023-07-15 NOTE — ED Provider Triage Note (Signed)
 Emergency Medicine Provider Triage Evaluation Note  Cardelia Sassano , a 73 y.o. female  was evaluated in triage.  Pt complains of weakness, being sleepy.  Pt sent here from the internal medicine clinic due to hypotension and somnolence  Review of Systems  Positive: Weakness and fatigue Negative: Fever or chills  Physical Exam  BP (!) 130/43   Pulse 92   Temp 97.6 F (36.4 C) (Oral)   Resp 18   Wt 64 kg   SpO2 95%   BMI 25.81 kg/m  Gen:   Awake, no distress   Resp:  Normal effort  MSK:   Moves extremities without difficulty  Other:    Medical Decision Making  Medically screening exam initiated at 12:56 PM.  Appropriate orders placed.  [name] was informed that the remainder of the evaluation will be completed by another provider, this initial triage assessment does not replace that evaluation, and the importance of remaining in the ED until their evaluation is complete.     Sandi Crosby, PA-C 07/15/23 1257

## 2023-07-15 NOTE — Assessment & Plan Note (Addendum)
 Patient presented for a hospital follow-up.  She has history of multiple falls. She was hypotensive in the clinic.  Initial blood pressure was 96/38 and repeat shows improvement in systolic but very low diastolic blood pressure.  Patient has complicated history and recent admission for heart failure exacerbation.  Exam overall does not show volume overload.  Differential diagnosis included hypovolemia versus iron  deficiency anemia versus medication induced.  Orthostatics were done that were limited due to patient's symptoms of dizziness and instability.  Her lying blood pressure was 130/33, sitting was 121/27 and standing was 114/37.  Concerning finding was that patient became sleepy during her interview.  Plan is to get stat labs to see what underlying etiology of her hypotension and sleepiness is. CBG was checked and was elevated ruling out hypoglycemia as the cause of her symptoms. She is on chronic opioid therapy and AKI secondary to hypotensive could result in somnolence. Will send her to the ED for further evaluation where she will benefit from lab work including CBC, BMP, iron  studies.  Given her history of falls, her orthostasis increases that risk and she needs prompt evaluation for this.

## 2023-07-16 ENCOUNTER — Ambulatory Visit: Admitting: Student

## 2023-07-16 DIAGNOSIS — J449 Chronic obstructive pulmonary disease, unspecified: Secondary | ICD-10-CM | POA: Diagnosis not present

## 2023-07-16 DIAGNOSIS — I5030 Unspecified diastolic (congestive) heart failure: Secondary | ICD-10-CM | POA: Diagnosis not present

## 2023-07-16 DIAGNOSIS — R5383 Other fatigue: Secondary | ICD-10-CM | POA: Diagnosis not present

## 2023-07-16 DIAGNOSIS — I4811 Longstanding persistent atrial fibrillation: Secondary | ICD-10-CM | POA: Diagnosis not present

## 2023-07-16 LAB — URINE CULTURE: Culture: NO GROWTH

## 2023-07-17 ENCOUNTER — Other Ambulatory Visit (HOSPITAL_COMMUNITY): Payer: Self-pay

## 2023-07-17 DIAGNOSIS — I4811 Longstanding persistent atrial fibrillation: Secondary | ICD-10-CM | POA: Diagnosis not present

## 2023-07-17 DIAGNOSIS — E119 Type 2 diabetes mellitus without complications: Secondary | ICD-10-CM | POA: Diagnosis not present

## 2023-07-17 DIAGNOSIS — J449 Chronic obstructive pulmonary disease, unspecified: Secondary | ICD-10-CM | POA: Diagnosis not present

## 2023-07-17 DIAGNOSIS — I5030 Unspecified diastolic (congestive) heart failure: Secondary | ICD-10-CM | POA: Diagnosis not present

## 2023-07-18 ENCOUNTER — Encounter: Payer: Self-pay | Admitting: Hematology

## 2023-07-18 ENCOUNTER — Other Ambulatory Visit (HOSPITAL_COMMUNITY): Payer: Self-pay

## 2023-07-18 MED ORDER — POLYETHYLENE GLYCOL 3350 17 GM/SCOOP PO POWD
17.0000 g | ORAL | 99 refills | Status: DC
  Filled 2023-07-18: qty 238, 28d supply, fill #0

## 2023-07-18 MED ORDER — RAMELTEON 8 MG PO TABS: 8.0000 mg | ORAL_TABLET | Freq: Every day | ORAL | 0 refills | Status: DC

## 2023-07-18 MED ORDER — FUROSEMIDE 40 MG PO TABS: 40.0000 mg | ORAL_TABLET | Freq: Every day | ORAL | 0 refills | Status: DC

## 2023-07-18 MED ORDER — ONDANSETRON 4 MG PO TBDP: 4.0000 mg | ORAL_TABLET | Freq: Every day | ORAL | 0 refills | Status: DC | PRN

## 2023-07-18 MED ORDER — NICOTINE 21 MG/24HR TD PT24: 21.0000 mg | MEDICATED_PATCH | Freq: Every day | TRANSDERMAL | 0 refills | Status: DC

## 2023-07-18 MED ORDER — SACUBITRIL-VALSARTAN 49-51 MG PO TABS: 1.0000 | ORAL_TABLET | Freq: Two times a day (BID) | ORAL | 0 refills | Status: DC

## 2023-07-18 MED ORDER — TRELEGY ELLIPTA 100-62.5-25 MCG/ACT IN AEPB
1.0000 | INHALATION_SPRAY | Freq: Every day | RESPIRATORY_TRACT | 0 refills | Status: DC
Start: 2023-07-02 — End: 2023-07-20

## 2023-07-18 MED ORDER — WARFARIN SODIUM 3 MG PO TABS: 3.0000 mg | ORAL_TABLET | Freq: Every evening | ORAL | 0 refills | Status: DC

## 2023-07-18 MED ORDER — DULOXETINE HCL 30 MG PO CPEP: 30.0000 mg | ORAL_CAPSULE | Freq: Every evening | ORAL | 0 refills | Status: DC

## 2023-07-18 MED ORDER — GABAPENTIN 600 MG PO TABS: 600.0000 mg | ORAL_TABLET | Freq: Every day | ORAL | 0 refills | Status: DC

## 2023-07-18 MED ORDER — ALBUTEROL SULFATE HFA 108 (90 BASE) MCG/ACT IN AERS: 2.0000 | INHALATION_SPRAY | Freq: Four times a day (QID) | RESPIRATORY_TRACT | 0 refills | Status: DC | PRN

## 2023-07-18 MED ORDER — OMEPRAZOLE 40 MG PO CPDR
40.0000 mg | DELAYED_RELEASE_CAPSULE | Freq: Two times a day (BID) | ORAL | 0 refills | Status: DC | PRN
  Filled 2023-07-18: qty 60, 30d supply, fill #0

## 2023-07-18 MED ORDER — SPIRONOLACTONE 50 MG PO TABS: 50.0000 mg | ORAL_TABLET | Freq: Every day | ORAL | 0 refills | Status: DC

## 2023-07-18 MED ORDER — LINACLOTIDE 72 MCG PO CAPS
72.0000 ug | ORAL_CAPSULE | Freq: Every day | ORAL | 0 refills | Status: DC
  Filled 2023-07-18: qty 30, 30d supply, fill #0

## 2023-07-18 MED ORDER — ALPRAZOLAM 0.5 MG PO TABS: 0.5000 mg | ORAL_TABLET | Freq: Every day | ORAL | 0 refills | Status: DC

## 2023-07-18 MED ORDER — CARVEDILOL 6.25 MG PO TABS: 6.2500 mg | ORAL_TABLET | Freq: Two times a day (BID) | ORAL | 0 refills | Status: DC

## 2023-07-18 MED ORDER — IPRATROPIUM BROMIDE 0.03 % NA SOLN
2.0000 | Freq: Three times a day (TID) | NASAL | 0 refills | Status: DC
  Filled 2023-07-18 – 2023-08-16 (×2): qty 30, 50d supply, fill #0

## 2023-07-18 MED ORDER — LINACLOTIDE 72 MCG PO CAPS: 72.0000 ug | ORAL_CAPSULE | ORAL | 0 refills | Status: DC

## 2023-07-18 MED ORDER — INSULIN DEGLUDEC FLEXTOUCH 100 UNIT/ML ~~LOC~~ SOPN: 34.0000 [IU] | PEN_INJECTOR | Freq: Every morning | SUBCUTANEOUS | 0 refills | Status: DC

## 2023-07-19 NOTE — Progress Notes (Signed)
 HEMATOLOGY/ONCOLOGY CLINIC NOTE  Date of Service: 07/20/2023  Patient Care Team: Sherol Dixie, MD as PCP - General (Internal Medicine) Darlis Eisenmenger, MD as PCP - Advanced Heart Failure (Cardiology) Wendie Hamburg, MD as PCP - Cardiology (Cardiology) Plyler, Melodie Spry, RD as Dietitian (Dietician) Maris Sickle, MD as Consulting Physician (Ophthalmology) Delilah Fend, MD (Inactive) as Consulting Physician (Gastroenterology) (Rounding), Imts Zilphia Hilt, MD as Attending Physician Orren Blades, OTR as Occupational Therapist (Occupational Therapy)  CHIEF COMPLAINTS/PURPOSE OF CONSULTATION:  evaluation and management of chronic iron  deficiency anemia.  HISTORY OF PRESENTING ILLNESS:   [name] is a wonderful 73 y.o. female who is here for evaluation and management of chronic iron  deficiency anemia.  Today, she is accompanied by her sister and presents in a wheelchair. Patient reports that she was seen by a different hematologist previously several years ago. Patient reports having anemia all her life. She reports that her hgb does go down multiple times, requiring her to present to the hospital to receive blood and iron .  Patient denies any concerns for bleeding, such as nose bleeds, gum bleeds, black stools,or blood in the stools.   She reports having multiple falls with balance issues and reports a fractured right wrist and hip. She reports that her hip had contact with a chair and she could barely walk for some time.  She fractured her hip in September and did have hip surgery. Unfortunately, while in a nursing home for rehab, she suffered a fall and fractured her right wrist. Patient reports that she was told to see a neurologist to evaluate her balance issues/falls and she is working on scheduling an appt. Her sister reports that patient has had balance issues for a while.  She reports that she felt that she was over-medicated white being treated in the  nursing facility following her hip surgery. She describes symptoms including paleness, lethargy, and confusion.   Her sister reports that patient continues to smoke more than a pack a day.  Patient reports a history of kidney cancer diagnosed 8-10 years ago. She reports that she had her first Cryoablation at that time and her second one five years later by Dr. Nereida Banning. Patient reports that she required a shot every month. She does not follow a Insurance underwriter. She follows with an interventional radiologist.    Patient reports that she was previously told that she did not need to use a CPAP machine 6 months ago. She was told that she does not have sleep apnea. She generally uses at-home oxygen  only when she uses her CPAP machine, which she typically uses everytime she sleeps. However, patient has issues with CPAP set up and has not used the CPAP machine recently.   She reports a hx of heart surgery. She is on coumadin  chronically for afib.   Patient has required blood transfusions twice a year over the last 5-7 years. Patient has not received erythropoietin  shots recently. Over the last 5-7 years, her anemia has been managed by her PCP.   She reports that she was told by hospital staff that she cannot receive iron  infusions due to reactions. She had iron  infusions 2-3 times in a hospital setting and not on an elective basis as outpatient. With her reaction to IV iron , her blood pressure dropped and she was given benedryl after the reaction.   Patient does take Prilosec.  She reports that she has lost 55 pounds in 2-3 years.   She denies any other changes  over the last 6 months.   She reports that she lives at home at this time.  INTERVAL HISTORY: Myeisha Arminta Vigo is a wonderful 73 y.o. female who is here for continued evaluation and management of chronic iron  deficiency anemia.  Patient was last seen by me on 05/19/2023 and complained of fatigue, mild chest pain, bilateral leg pain with  swelling, and constipation.  She presented to the ED on 07/15/2023 for somnolence. CT head w/o contrast showed no acute intracranial abnormality. Chest x-ray showed No acute cardiopulmonary findings.   Patient notes no overt GI bleeding. Toilerated IV injectafer  without any issues. Labs today discussed in details with the patient.   MEDICAL HISTORY:  Past Medical History:  Diagnosis Date   Abnormality of lung on CXR 02/02/2020   Nonspecific finding on CXR ordered by pulmonologist - c/w inflammation vs infection, f/u imaging suggested, Dr. Antonette Batters office has been in communication about recommended next steps.   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   Non-bleeding when visualized on capsule endoscopy 06/30/2015    Arthritis    lower back; hands (02/19/2018)   Asthma    Asymptomatic cholelithiasis 09/25/2015   Seen on CT scan 08/2015   Carotid artery stenosis; s/p R endarterectomy    s/p right endarterectomy (06/2010) Carotid US  (07/2010):  Left: Moderate-to-severe (60-79%) calcific and non-calcific plaque origin and proximal ICA and ECA    Chronic congestive heart failure with left ventricular diastolic dysfunction (HCC) 10/21/2010   Chronic constipation 02/03/2011   Chronic daily headache 01/16/2014   Chronic iron  deficiency anemia    Chronic low back pain 10/06/2012   Chronic venous insufficiency 08/04/2012   Closed fracture of one rib of left side 08/23/2021   ED visit after a fall 08/16/21    COPD (chronic obstructive pulmonary disease) with emphysema (HCC)    PFTs 2018: severe obstructive disease, insignif response to bronchiodilator, mild restriction parenchymal pattern, moderately severe diffusion defect. 2014  FEV1 0.92 (40%), ratio 69, 27% increase in FEV1 with BD, TLC 91%, severe airtrapping, DLCO49% On chronic home O2. Pulmonary rehab referral  05/2012    Dyspnea    Fibromyalgia 08/29/2010   Gastroesophageal reflux disease    History of blood transfusion    several times  (02/19/2018)   History of clear cell renal cell carcinoma (HCC), in remission 07/21/2011   s/p cryoablation of left RCC in 09/2011 by Dr. Nereida Banning. Followed by Dr. Joie Narrow  [facility]) .     History of fracture of left hip 10/17/2022   History of hiatal hernia    History of mitral valve replacement with bioprosthetic valve due to mitral stenosis 2012   s/p MVR with a 27-mm pericardial porcine valve (Medtronic Mosaic valve, serial #[id number] on 09/20/10, Dr. Matt Song)    History of obstructive sleep apnea, resolved 2013   resolved per sleep study 07/2019; no apnea, but did have desaturation.  CPAP no longer necessary.  Nocturnal polysomnography (06/2009): Moderate sleep apnea/ hypopnea syndrome , AHI 17.8 per hour with nonpositional hypopneas. CPAP titration to 12 CWP, AHI 2.4 per hour. On nocturnal CPAP via a small resMed Quattro full-face mask with heated humidifier.    History of pneumonia    once  (02/19/2018)   History of seborrheic keratosis 09/28/2015   History of stroke without residual deficits 01/17/2022   Hyperlipidemia LDL goal < 100 11/20/2005   Internal and external hemorrhoids  without complication 08/04/2012   Lesion of left native kidney 06/01/2020   Incidental finding on recent screening chest CT for lung ca ordered by Dr. Linder Revere pulmonology - he has ordered a f/u renal U/S.    Lichen sclerosus of female genitalia 01/12/2017   Migraine    none in years (02/19/2018)   Mild cognitive impairment with memory loss 12/23/2021   Moderately severe major depression (HCC) 11/19/2005   Nocturnal hypoxia per sleep study 07/2019    Osteoporosis    DEXA 2016: T -2.7; DEXA (12/09/2011): L-spine T -3.7, left hip T -1.4 DEXA (12/2004): L-spine T -2.6, left hip -0.1    Paroxysmal atrial fibrillation (HCC) 10/22/2010   s/p Left atrial maze procedure  for paroxysmal atrial fibrillation on 09/20/2010 by Dr Matt Song.  Subsequent splenic infarct, decision was made to re-anticoagulate with coumadin , likely life-long as this is the most likely cause of the splenic infarct.    Personal history of colonic polyps 05/14/2011   Colonoscopy (05/2011): 4 mm adenomatous polyp excised endoscopically Colonoscopy (02/2002): Adenomatous polyp excised endoscopically    Personal history of renal cell carcinoma 09/12/2020   Pneumonia    Pulmonary hypertension due to chronic obstructive pulmonary disease (HCC) 04/25/2016   2014 TEE w PA peak pressure 46 mmHg, s/p MV replacement    Right nephrolithiasis, asymptomatic, incidental finding 09/06/2014   5 mm non-obstructing calculus seen on CT scan 09/05/2014    Right ventricular failure (HCC) 04/25/2016   Severe obesity (BMI 35.0-39.9) with comorbidity (HCC) 10/23/2011   Sleep apnea    Stage 2 chronic kidney disease due to type 2 diabetes mellitus (HCC) 12/16/2018   CKD stage III     Tobacco abuse 07/28/2012   Type 2 diabetes mellitus with complication, with long-term current use of insulin  (HCC)    Type 2 diabetes mellitus with diabetic neuropathy (HCC)    Weight loss due to medication 03/12/2023    SURGICAL HISTORY: Past Surgical History:  Procedure Laterality Date   BIOPSY  07/19/2021   Procedure: BIOPSY;  Surgeon: Ozell Blunt, MD;  Location: [facility];  Service: Gastroenterology;;   CARDIAC CATHETERIZATION     CARDIAC VALVE REPLACEMENT  Aug. 2012   mitral valve   CAROTID ENDARTERECTOMY Right 07/04/2010   by Dr. [name] Cookey for asymptomatic right carotid artery stenosis   CATARACT EXTRACTION W/ INTRAOCULAR LENS  IMPLANT, BILATERAL Bilateral    CHEST TUBE INSERTION  09/24/2010   Dr Carloyn Chi Trigt   COLONOSCOPY  05/12/2011   performed by Dr. Honey Lusty. Showing small internal hemorrhoids, single tubular adenoma polyp   COLONOSCOPY WITH PROPOFOL  N/A 05/29/2020   Procedure: COLONOSCOPY WITH PROPOFOL ;  Surgeon: Ozell Blunt, MD;  Location: [facility];  Service: Endoscopy;  Laterality: N/A;   COLONOSCOPY WITH PROPOFOL  N/A 07/19/2021   Procedure: COLONOSCOPY WITH PROPOFOL ;  Surgeon: Ozell Blunt, MD;  Location: [facility];  Service: Gastroenterology;  Laterality: N/A;   CRYOABLATION Left 09/2011   by Dr. Nereida Banning. Followed by Dr. Joie Narrow  [facility]) .     DILATION AND CURETTAGE OF UTERUS     ESOPHAGOGASTRODUODENOSCOPY  05/12/2011   performed by Dr. Honey Lusty. Negative for ulcerations, biopsy negative for evidence of celiac sprue   ESOPHAGOGASTRODUODENOSCOPY N/A 06/29/2015   Procedure: ESOPHAGOGASTRODUODENOSCOPY (EGD);  Surgeon: Ozell Blunt, MD;  Location: [facility] ENDOSCOPY;  Service: Endoscopy;  Laterality: N/A;   ESOPHAGOGASTRODUODENOSCOPY N/A 03/29/2016   Procedure: ESOPHAGOGASTRODUODENOSCOPY (EGD);  Surgeon: Ozell Blunt, MD;  Location[facility] ENDOSCOPY;  Service: Endoscopy;  Laterality: N/A;   ESOPHAGOGASTRODUODENOSCOPY (EGD) WITH  PROPOFOL  N/A 05/29/2020   Procedure: ESOPHAGOGASTRODUODENOSCOPY (EGD) WITH PROPOFOL ;  Surgeon: Ozell Blunt, MD;  Location: [facility];  Service: Endoscopy;  Laterality: N/A;   FRACTURE SURGERY     GIVENS CAPSULE STUDY N/A 06/30/2015   Procedure: GIVENS CAPSULE STUDY;  Surgeon: Ozell Blunt, MD;  Location: [facility] ENDOSCOPY;  Service: Endoscopy;  Laterality: N/A;   GIVENS CAPSULE STUDY N/A 06/29/2015   Procedure: GIVENS CAPSULE STUDY;  Surgeon: Ozell Blunt, MD;  Location: [facility] ENDOSCOPY;  Service: Endoscopy;  Laterality: N/A;   HEMORRHOID SURGERY  1970s?   lanced   HYSTEROSCOPY W/ ENDOMETRIAL ABLATION  06/2001   for persistent post-menopausal bleeding // by S. Saint Cranker, M.D.   INTRAMEDULLARY (IM) NAIL INTERTROCHANTERIC Left 10/19/2022   Procedure: LEFT INTRAMEDULLARY (IM) NAIL INTERTROCHANTERIC;  Surgeon: Gaylon Kea, MD;  Location: [facility] OR;  Service: Orthopedics;  Laterality: Left;   IR GENERIC HISTORICAL  08/23/2015   IR RADIOLOGIST EVAL & MGMT 08/23/2015 Erica Hau, MD [facility] INTERV  RAD   IR GENERIC HISTORICAL  04/09/2016   IR RADIOLOGIST EVAL & MGMT 04/09/2016 Erica Hau, MD [facility] INTERV RAD   IR RADIOLOGIST EVAL & MGMT  10/07/2016   IR RADIOLOGIST EVAL & MGMT  06/25/2017   IR RADIOLOGIST EVAL & MGMT  07/31/2020   IR RADIOLOGIST EVAL & MGMT  10/02/2020   IR RADIOLOGIST EVAL & MGMT  12/20/2020   IR RADIOLOGIST EVAL & MGMT  12/25/2021   IR RADIOLOGIST EVAL & MGMT  04/16/2023   LEFT HEART CATH AND CORONARY ANGIOGRAPHY N/A 04/21/2016   Procedure: Left Heart Cath and Coronary Angiography;  Surgeon: Avanell Leigh, MD;  Location: [facility] INVASIVE CV LAB;  Service: Cardiovascular;  Laterality: N/A;   LIPOMA EXCISION  08/2005   occipital lipoma 1.5cm - by Dr. Corliss Dies   LITHOTRIPSY  ~ 2000   MAZE Left 09/20/10   for paroxysmal atrial fibrillation (Dr. Matt Song)   MITRAL VALVE REPLACEMENT  09/20/10    with a 27-mm pericardial porcine valve (Medtronic Mosaic valve, serial #[id number]). 09/20/10, Dr Matt Song   ORIF CLAVICLE FRACTURE Right 01/2004   by Myrtle Atta. Murrel Arnt, M.D for Right clavicle nonunion.; it's got a pin in it   POLYPECTOMY  05/29/2020   Procedure: POLYPECTOMY;  Surgeon: Ozell Blunt, MD;  Location: [facility] END[facility];  Service: Endoscopy;;   POLYPECTOMY  07/19/2021   Procedure: POLYPECTOMY;  Surgeon: Ozell Blunt, MD;  Location: [facility] ENDOSCOPY;  Service: Gastroenterology;;   RADIOLOGY WITH ANESTHESIA Left 09/12/2020   Procedure: RADIOLOGY WITH ANESTHESIA- RENAL CRYOABLATION;  Surgeon: Erica Hau, MD;  Location: [facility] ORS;  Service: Radiology;  Laterality: Left;   REFRACTIVE SURGERY Bilateral    RIGHT HEART CATH N/A 04/23/2016   Procedure: Right Heart Cath;  Surgeon: Darlis Eisenmenger, MD;  Location: [facility] INVASIVE CV LAB;  Service: Cardiovascular;  Laterality: N/A;   TONSILLECTOMY     TUBAL LIGATION      SOCIAL HISTORY: Social History   Socioeconomic History   Marital status: Divorced    Spouse name: Not on file   Number of children: Not on file   Years of education: Not on file    Highest education level: Not on file  Occupational History   Not on file  Tobacco Use   Smoking status: Every Day    Current packs/day: 1.00    Average packs/day: 1 pack/day for 60.7 years (60.7 ttl pk-yrs)    Types: Cigarettes    Start date: 11/01/1962   Smokeless tobacco: Never   Tobacco comments:  At least 1 PPD  Vaping Use   Vaping status: Never Used  Substance and Sexual Activity   Alcohol use: Not Currently    Comment: 02/19/2018 nothing since 1999   Drug use: Not Currently    Types: Marijuana    Comment: 02/19/2018 nothing since the 1990s   Sexual activity: Not Currently    Birth control/protection: Post-menopausal  Other Topics Concern   Not on file  Social History Narrative   Lives alone in [place] ([address])   Worked at [employer] for 18 years   No car   Social Drivers of Corporate investment banker Strain: Low Risk  (04/08/2023)   Overall Financial Resource Strain (CARDIA)    Difficulty of Paying Living Expenses: Not hard at all  Food Insecurity: No Food Insecurity (06/18/2023)   Hunger Vital Sign    Worried About Running Out of Food in the Last Year: Never true    Ran Out of Food in the Last Year: Never true  Recent Concern: Food Insecurity - Food Insecurity Present (05/25/2023)   Hunger Vital Sign    Worried About Running Out of Food in the Last Year: Never true    Ran Out of Food in the Last Year: Sometimes true  Transportation Needs: No Transportation Needs (06/18/2023)   PRAPARE - Administrator, Civil Service (Medical): No    Lack of Transportation (Non-Medical): No  Recent Concern: Transportation Needs - Unmet Transportation Needs (04/08/2023)   PRAPARE - Transportation    Lack of Transportation (Medical): Yes    Lack of Transportation (Non-Medical): Yes  Physical Activity: Inactive (04/08/2023)   Exercise Vital Sign    Days of Exercise per Week: 0 days    Minutes of Exercise per Session: 0 min  Stress: Stress Concern  Present (04/08/2023)   [employer] of Occupational Health - Occupational Stress Questionnaire    Feeling of Stress : Rather much  Social Connections: Socially Isolated (06/18/2023)   Social Connection and Isolation Panel    Frequency of Communication with Friends and Family: More than three times a week    Frequency of Social Gatherings with Friends and Family: More than three times a week    Attends Religious Services: Never    Database administrator or Organizations: No    Attends Banker Meetings: Never    Marital Status: Divorced  Catering manager Violence: Not At Risk (06/18/2023)   Humiliation, Afraid, Rape, and Kick questionnaire    Fear of Current or Ex-Partner: No    Emotionally Abused: No    Physically Abused: No    Sexually Abused: No    FAMILY HISTORY: Family History  Problem Relation Age of Onset   Peptic Ulcer Disease Father    Heart attack Father 12       Died of MI at age 8   Heart attack Brother 44       Died of MI at age 79   Obesity Brother    Pneumonia Mother    Healthy Sister    Lupus Daughter    Obsessive Compulsive Disorder Daughter     ALLERGIES:  is allergic to dexilant [dexlansoprazole], metoclopramide , isovue  [iopamidol ], lorazepam, oxycontin  [oxycodone ], and tramadol hcl.  MEDICATIONS:  Current Outpatient Medications  Medication Sig Dispense Refill   albuterol  (VENTOLIN  HFA) 108 (90 Base) MCG/ACT inhaler Inhale 2 puffs into the lungs every 6 hours as needed for shortness of breath 54 g 10   ALPRAZolam  (XANAX ) 0.5 MG  tablet Take 1 tablet (0.5 mg total) by mouth at bedtime. To help with sleep. 30 tablet 0   ALPRAZolam  (XANAX ) 0.5 MG tablet Take 1 tablet by mouth every night at bedtime for 14 days 14 tablet 0   buPROPion  (WELLBUTRIN  XL) 150 MG 24 hr tablet Take 1 tablet (150 mg total) by mouth every morning. 90 tablet 3   carvedilol  (COREG ) 6.25 MG tablet Take 1 tablet (6.25 mg total) by mouth 2 (two) times daily with a meal.      Continuous Glucose Sensor (FREESTYLE LIBRE 3 PLUS SENSOR) MISC Change sensor every 15 days. 6 each 3   diclofenac  Sodium (VOLTAREN ) 1 % GEL Apply 4 g topically 4 (four) times daily -  before meals and at bedtime.     DULoxetine  (CYMBALTA ) 30 MG capsule Take 1 capsule (30 mg total) by mouth every evening at 8:00 pm. 30 capsule 0   DULoxetine  (CYMBALTA ) 60 MG capsule Take 1 capsule (60 mg total) by mouth once for 1 dose.     Fluticasone -Umeclidin-Vilant (TRELEGY ELLIPTA ) 100-62.5-25 MCG/ACT AEPB Inhale 1 puff into the lungs daily. 180 each 3   furosemide  (LASIX ) 40 MG tablet Take 1 tablet (40 mg total) by mouth daily.     gabapentin  (NEURONTIN ) 600 MG tablet Take 1 tablet (600 mg total) by mouth at bedtime. 90 tablet 3   HYDROcodone -acetaminophen  (NORCO/VICODIN) 5-325 MG tablet Take 2 tablets by mouth 2 (two) times daily. 120 tablet 0   HYDROcodone -acetaminophen  (NORCO/VICODIN) 5-325 MG tablet Take 2 tablets by mouth every 6 hours as needed for pain for 7 days 56 tablet 0   insulin  aspart (NOVOLOG ) 100 UNIT/ML injection Inject 4 Units into the skin 3 (three) times daily with meals.     insulin  aspart (NOVOLOG ) 100 UNIT/ML injection Inject 0-15 Units into the skin 3 (three) times daily with meals.     insulin  aspart (NOVOLOG ) 100 UNIT/ML injection Inject 0-5 Units into the skin at bedtime.     insulin  degludec (TRESIBA ) 100 UNIT/ML FlexTouch Pen Inject 30 Units into the skin every morning. 3 mL 6   Insulin  Degludec FlexTouch 100 UNIT/ML SOPN Inject 34 Units into the skin in the morning at 8:00 am. 30 mL 0   insulin  glargine-yfgn (SEMGLEE ) 100 UNIT/ML injection Inject 0.4 mLs (40 Units total) into the skin daily.     Insulin  Pen Needle 32G X 4 MM MISC Use to inject insulin  daily 100 each 3   ipratropium (ATROVENT ) 0.03 % nasal spray Place 2 sprays into the nose 3 (three) times daily. 30 mL 0   ipratropium-albuterol  (DUONEB) 0.5-2.5 (3) MG/3ML SOLN Inhale 3 mLs by nebulization every 6 (six) hours as  needed (shortness of breath, wheezing). 90 mL 3   lidocaine  (LIDODERM ) 5 % Place 1 patch onto the skin daily. Remove & Discard patch within 12 hours or as directed by MD     linaclotide  (LINZESS ) 72 MCG capsule Take 1 capsule (72 mcg total) by mouth daily before breakfast. 90 capsule 3   nicotine  (NICODERM CQ  - DOSED IN MG/24 HOURS) 21 mg/24hr patch Place 1 patch (21 mg total) onto the skin daily at 9:00 pm. 28 patch 0   omeprazole  (PRILOSEC) 40 MG capsule Take 1 capsule (40 mg total) by mouth 2 (two) times daily. 60 capsule 5   ondansetron  (ZOFRAN ) 4 MG tablet Take 1 tablet (4 mg total) by mouth daily as needed for nausea or vomiting. 30 tablet 1   ondansetron  (ZOFRAN -ODT) 4 MG disintegrating tablet Take  1 tablet (4 mg total) by mouth daily as needed for nausea or vomiting. 30 tablet 0   OXYGEN  Inhale 2 L into the lungs at bedtime.     phenol (CHLORASEPTIC) 1.4 % LIQD Use as directed 1 spray in the mouth or throat as needed for throat irritation / pain.     [Paused] potassium chloride  (KLOR-CON  M) 10 MEQ tablet Take 1 tablet (10 mEq total) by mouth daily. 30 tablet 11   ramelteon  (ROZEREM ) 8 MG tablet Take 1 tablet (8 mg total) by mouth at bedtime. 30 tablet 3   sacubitril -valsartan  (ENTRESTO ) 24-26 MG Take 1 tablet by mouth 2 (two) times daily.     sacubitril -valsartan  (ENTRESTO ) 49-51 MG Take 1 tablet by mouth 2 (two) times daily at 8:00 am and 8:00 pm. 60 tablet 0   spironolactone  (ALDACTONE ) 50 MG tablet Take 1 tablet (50 mg total) by mouth daily.     venlafaxine  XR (EFFEXOR -XR) 150 MG 24 hr capsule Take 1 capsule (150 mg total) by mouth daily with breakfast. 90 capsule 3   warfarin (COUMADIN ) 3 MG tablet Take 1 tablet (3 mg total) by mouth every evening at 8:00 pm 30 tablet 0   warfarin (COUMADIN ) 5 MG tablet Take 1 tablet (5 mg total) by mouth one time only at 4 PM.     No current facility-administered medications for this visit.    REVIEW OF SYSTEMS:    10 Point review of Systems was  done is negative except as noted above.  PHYSICAL EXAMINATION:  .BP (!) 118/38   Pulse 64   Temp (!) 97.2 F (36.2 C)   Resp 18   Wt 140 lb 8 oz (63.7 kg)   SpO2 96%   BMI 25.70 kg/m    GENERAL:alert, in no acute distress and comfortable SKIN: no acute rashes, no significant lesions EYES: conjunctiva are pink and non-injected, sclera anicteric OROPHARYNX: MMM, no exudates, no oropharyngeal erythema or ulceration NECK: supple, no JVD LYMPH:  no palpable lymphadenopathy in the cervical, axillary or inguinal regions LUNGS: clear to auscultation b/l with normal respiratory effort HEART: regular rate & rhythm ABDOMEN:  normoactive bowel sounds , non tender, not distended. Extremity: no pedal edema PSYCH: alert & oriented x 3 with fluent speech NEURO: no focal motor/sensory deficits   LABORATORY DATA:  I have reviewed the data as listed  .    Latest Ref Rng & Units 07/20/2023    1:57 PM 07/15/2023   12:55 PM 07/06/2023    5:46 PM  CBC  WBC 4.0 - 10.5 K/uL 6.9  5.5    Hemoglobin 12.0 - 15.0 g/dL 16.1  09.6  04.5   Hematocrit 36.0 - 46.0 % 34.9  38.6  35.0   Platelets 150 - 400 K/uL 204  202      .    Latest Ref Rng & Units 07/20/2023    1:57 PM 07/15/2023   12:55 PM 07/06/2023    5:46 PM  CMP  Glucose 70 - 99 mg/dL 409  811  914   BUN 8 - 23 mg/dL 19  26  31    Creatinine 0.44 - 1.00 mg/dL 7.82  9.56  2.13   Sodium 135 - 145 mmol/L 140  138  139   Potassium 3.5 - 5.1 mmol/L 4.5  4.2  4.7   Chloride 98 - 111 mmol/L 105  105  105   CO2 22 - 32 mmol/L 32  22    Calcium  8.9 - 10.3 mg/dL 9.8  9.3    Total Protein 6.5 - 8.1 g/dL 6.2  5.7    Total Bilirubin 0.0 - 1.2 mg/dL 0.4  0.5    Alkaline Phos 38 - 126 U/L 56  48    AST 15 - 41 U/L 14  15    ALT 0 - 44 U/L 17  15     . Lab Results  Component Value Date   IRON  70 07/20/2023   TIBC 300 07/20/2023   IRONPCTSAT 23 07/20/2023   (Iron  and TIBC)  Lab Results  Component Value Date   FERRITIN 231 07/20/2023      ASSESSMENT & PLAN:  73 y.o. female with:  Chronic iron  deficiency anemia Likely chronic slow GI losses. Also on chronic PPI therapy PLAN:  -Discussed lab results from today, 07/20/2023, in detail with patient  Hgb stable in the high 10's Ferritin >200 and iron  sat >20 at goals - no indication for additional IV iron  at this time. -monitor for GI bleeding and labs with PCP in 3 months -we will see her back in 6 months  FOLLOW-UP: F/u with PCP in 3 months for rpt labs RTC with Dr Salomon Cree in 6 months with labs   The total time spent in the appointment was 20 minutes* .  All of the patient's questions were answered with apparent satisfaction. The patient knows to call the clinic with any problems, questions or concerns.   Jacquelyn Matt MD MS AAHIVMS [facility] Hematology/Oncology Physician [facility]  .*Total Encounter Time as defined by the Centers for Medicare and Medicaid Services includes, in addition to the face-to-face time of a patient visit (documented in the note above) non-face-to-face time: obtaining and reviewing outside history, ordering and reviewing medications, tests or procedures, care coordination (communications with other health care professionals or caregivers) and documentation in the medical record.    I,Mitra Faeizi,acting as a Neurosurgeon for Jacquelyn Matt, MD.,have documented all relevant documentation on the behalf of Jacquelyn Matt, MD,as directed by  Jacquelyn Matt, MD while in the presence of Jacquelyn Matt, MD.  .I have reviewed the above documentation for accuracy and completeness, and I agree with the above. .Taren Dymek Kishore Taran Hable MD

## 2023-07-20 ENCOUNTER — Inpatient Hospital Stay: Admitting: Hematology

## 2023-07-20 ENCOUNTER — Telehealth: Payer: Self-pay | Admitting: *Deleted

## 2023-07-20 ENCOUNTER — Inpatient Hospital Stay: Attending: Hematology

## 2023-07-20 ENCOUNTER — Other Ambulatory Visit (HOSPITAL_COMMUNITY): Payer: Self-pay

## 2023-07-20 ENCOUNTER — Other Ambulatory Visit: Payer: Self-pay

## 2023-07-20 VITALS — BP 118/38 | HR 64 | Temp 97.2°F | Resp 18 | Wt 140.5 lb

## 2023-07-20 DIAGNOSIS — D509 Iron deficiency anemia, unspecified: Secondary | ICD-10-CM | POA: Insufficient documentation

## 2023-07-20 DIAGNOSIS — Z79899 Other long term (current) drug therapy: Secondary | ICD-10-CM | POA: Diagnosis not present

## 2023-07-20 DIAGNOSIS — D500 Iron deficiency anemia secondary to blood loss (chronic): Secondary | ICD-10-CM

## 2023-07-20 LAB — CBC WITH DIFFERENTIAL (CANCER CENTER ONLY)
Abs Immature Granulocytes: 0.01 10*3/uL (ref 0.00–0.07)
Basophils Absolute: 0 10*3/uL (ref 0.0–0.1)
Basophils Relative: 1 %
Eosinophils Absolute: 0.2 10*3/uL (ref 0.0–0.5)
Eosinophils Relative: 3 %
HCT: 34.9 % — ABNORMAL LOW (ref 36.0–46.0)
Hemoglobin: 10.8 g/dL — ABNORMAL LOW (ref 12.0–15.0)
Immature Granulocytes: 0 %
Lymphocytes Relative: 21 %
Lymphs Abs: 1.4 10*3/uL (ref 0.7–4.0)
MCH: 27.5 pg (ref 26.0–34.0)
MCHC: 30.9 g/dL (ref 30.0–36.0)
MCV: 88.8 fL (ref 80.0–100.0)
Monocytes Absolute: 0.6 10*3/uL (ref 0.1–1.0)
Monocytes Relative: 8 %
Neutro Abs: 4.6 10*3/uL (ref 1.7–7.7)
Neutrophils Relative %: 67 %
Platelet Count: 204 10*3/uL (ref 150–400)
RBC: 3.93 MIL/uL (ref 3.87–5.11)
RDW: 23.6 % — ABNORMAL HIGH (ref 11.5–15.5)
WBC Count: 6.9 10*3/uL (ref 4.0–10.5)
nRBC: 0 % (ref 0.0–0.2)

## 2023-07-20 LAB — CMP (CANCER CENTER ONLY)
ALT: 17 U/L (ref 0–44)
AST: 14 U/L — ABNORMAL LOW (ref 15–41)
Albumin: 3.8 g/dL (ref 3.5–5.0)
Alkaline Phosphatase: 56 U/L (ref 38–126)
Anion gap: 3 — ABNORMAL LOW (ref 5–15)
BUN: 19 mg/dL (ref 8–23)
CO2: 32 mmol/L (ref 22–32)
Calcium: 9.8 mg/dL (ref 8.9–10.3)
Chloride: 105 mmol/L (ref 98–111)
Creatinine: 0.72 mg/dL (ref 0.44–1.00)
GFR, Estimated: >60 mL/min (ref >60)
Glucose, Bld: 138 mg/dL — ABNORMAL HIGH (ref 70–99)
Potassium: 4.5 mmol/L (ref 3.5–5.1)
Sodium: 140 mmol/L (ref 135–145)
Total Bilirubin: 0.4 mg/dL (ref 0.0–1.2)
Total Protein: 6.2 g/dL — ABNORMAL LOW (ref 6.5–8.1)

## 2023-07-20 LAB — LACTATE DEHYDROGENASE: LDH: 160 U/L (ref 98–192)

## 2023-07-20 LAB — IRON AND IRON BINDING CAPACITY (CC-WL,HP ONLY)
Iron: 70 ug/dL (ref 28–170)
Saturation Ratios: 23 % (ref 10.4–31.8)
TIBC: 300 ug/dL (ref 250–450)
UIBC: 230 ug/dL (ref 148–442)

## 2023-07-20 LAB — FERRITIN: Ferritin: 231 ng/mL (ref 11–307)

## 2023-07-20 NOTE — Progress Notes (Signed)
Internal Medicine Clinic Attending  I was physically present during the key portions of the resident provided service and participated in the medical decision making of patient's management care. I reviewed pertinent patient test results.  The assessment, diagnosis, and plan were formulated together and I agree with the documentation in the resident's note.  Williams, Julie Anne, MD  

## 2023-07-20 NOTE — Telephone Encounter (Signed)
 I spoke with the patient today as pager received on overnight pager ~10pm. She is concerned because she went to measure her BP at a store today and it was high. She is unfortunately unable to tell me what these values were. Largely asymptomatic, no HA, CP, vision changes. Used to be on GDMT but not taking any. States weight is at baseline (140lbs), and denies any increased SOB, BLE edema, increased abdominal girth, orthopnea. She is not taking furosemide . Of note, she was recently seen for hypotension and then was sent to the ED for somnolence and hypotension on 6/4, given a dose of fosfomycin for possible UTI although she denies any urinary sxs. She cannot find her blood pressure cuff so she has not been monitoring her BP. Instructed that it is hard for me to tell her what medications she needs to be on for her blood pressure without any parameters or readings. She is unable to come in for a nurse BP check tomorrow. She will try to record her blood pressure at home as she will ask her sister for her blood pressure cuff and send these values. Duplicate medicines on record, cleaned up but on two different doses for Entresto , Cymbalta . Pt is also unclear what her current diabetes regimen. Pt is unsure what her numbers are. Currently taking 30 units of Tresiba  in the morning. Did not get any novolog  to take home, but has some prescribed. Will need appointment to go over her meds, assess blood pressure and diabetes medications. She is on centrally acting meds as well that may have contributed to her somnolence on 07/15/2023.   [name] PGY-1  IM[facility]

## 2023-07-20 NOTE — Telephone Encounter (Signed)
 Copied from CRM [id number]. Topic: Clinical - Medication Question >> Jul 20, 2023 10:09 AM [name] H wrote: Reason for CRM: Patient was calling because she is needing to speak to her provider about her medicine, patient stated that why she was in rehab they gave her medicine but she does not ant to take it and can't remember what the medicines were, she wants to stick to what she was doing and taking before she went to rehab. Patients callback number is [phone]. She said she is having a hard time checking her medicines on MyChart because its so many duplicates and etc. She states the new medicines are not even on there, she said she has the discharge papers from rehab with the medicines but they aren't at the pharmacy or on MyChart.

## 2023-07-20 NOTE — Telephone Encounter (Signed)
 Pt called again at around 11:30pm- she was able to located blood pressure cuff. Reported BP of 147/111 but then it went back down to an SBP in the 120s. I instructed to keep holding her blood pressure meds but she will need to keep recording her blood pressure at home and then send us  a log. Will likely still need an appointment to go over the rest of her meds.

## 2023-07-21 ENCOUNTER — Other Ambulatory Visit (HOSPITAL_COMMUNITY): Payer: Self-pay

## 2023-07-21 ENCOUNTER — Ambulatory Visit

## 2023-07-21 ENCOUNTER — Other Ambulatory Visit: Payer: Self-pay | Admitting: Internal Medicine

## 2023-07-21 MED ORDER — HYDROCODONE-ACETAMINOPHEN 5-325 MG PO TABS
2.0000 | ORAL_TABLET | Freq: Four times a day (QID) | ORAL | 0 refills | Status: DC
  Filled 2023-07-21: qty 56, 7d supply, fill #0

## 2023-07-21 MED ORDER — ALPRAZOLAM 0.5 MG PO TABS
0.5000 mg | ORAL_TABLET | Freq: Every day | ORAL | 0 refills | Status: DC
  Filled 2023-07-21: qty 14, 14d supply, fill #0

## 2023-07-21 NOTE — Telephone Encounter (Signed)
 Medication was discontinued 07/01/23

## 2023-07-21 NOTE — Telephone Encounter (Signed)
 Call to patient .  Unable to get a copy of her discharge meds to the Clinics. Was discharged on Saturday.  Will call the facility that she was in and see if they will fax over a copy.  Given Fax number for the facility to send over her information.  Unable to come in for an appointment until perhaps Friday.

## 2023-07-22 ENCOUNTER — Telehealth: Payer: Self-pay | Admitting: Internal Medicine

## 2023-07-22 ENCOUNTER — Telehealth: Payer: Self-pay | Admitting: *Deleted

## 2023-07-22 NOTE — Telephone Encounter (Signed)
 I spoke with pt to sch her an appt for HFU, pt states she will call back 07/23/2023 to sch the appt.

## 2023-07-22 NOTE — Telephone Encounter (Signed)
 Please refer to message below.  Patient contact via telephone this morning to schedule an appointment, but no answer.  Left detailed message to call the office back to schedule an appointment with first available resident since Dr. Broadus Canes has no openings this month.

## 2023-07-22 NOTE — Telephone Encounter (Signed)
-----   Message from [place] sent at 07/20/2023 11:15 PM EDT ----- Regarding: Medication reconciliation Hi Team,   I spoke to this patient tonight. She had multiple medications on file that were duplicate. Unclear what blood pressure medications she should be on and which ones she should not be on. She was on GDMT but then hypotensive and somnolent. She is also unclear as to what diabetes regimen she needs to be on. Instructed her that she will likely need an appointment (please refer to note on encounter). She had called earlier today but got no call back, received page tonight. Went over her meds with her but some of them have different doses. She cannot come in for a blood pressure check tomorrow as she has [employer] coming into her house and has no transportation. Checked her BP today but unable to give me the numbers. Has not been checking her sugars at home either. Unable to give recs without any parameters. Asymptomatic and based on history seems euvolemic with no SOB, orthopnea or BLE edema. Can you please call her to schedule an appointment for her? Instructed her to call the office by the afternoon if she does not hear back from us .   Thank you so much!   Sincerely,  [place]

## 2023-07-22 NOTE — Telephone Encounter (Signed)
 Pt provides INR result from PST FS POC device = 1.2 (target range 2.0 - 2.5) on 17.5 mg warfarin/wk. Increased to 25 mg warfarin/wk. Repeat INR 16-JUN-25. No symptoms/sx of embolic events or bleeding. No new medications. No missed doses.

## 2023-07-23 ENCOUNTER — Encounter: Admitting: Internal Medicine

## 2023-07-23 ENCOUNTER — Telehealth: Admitting: Student

## 2023-07-23 DIAGNOSIS — D509 Iron deficiency anemia, unspecified: Secondary | ICD-10-CM | POA: Diagnosis not present

## 2023-07-23 DIAGNOSIS — G4733 Obstructive sleep apnea (adult) (pediatric): Secondary | ICD-10-CM | POA: Diagnosis not present

## 2023-07-23 DIAGNOSIS — G8929 Other chronic pain: Secondary | ICD-10-CM | POA: Diagnosis not present

## 2023-07-23 DIAGNOSIS — I130 Hypertensive heart and chronic kidney disease with heart failure and stage 1 through stage 4 chronic kidney disease, or unspecified chronic kidney disease: Secondary | ICD-10-CM | POA: Diagnosis not present

## 2023-07-23 DIAGNOSIS — E785 Hyperlipidemia, unspecified: Secondary | ICD-10-CM | POA: Diagnosis not present

## 2023-07-23 DIAGNOSIS — E1140 Type 2 diabetes mellitus with diabetic neuropathy, unspecified: Secondary | ICD-10-CM | POA: Diagnosis not present

## 2023-07-23 DIAGNOSIS — I4811 Longstanding persistent atrial fibrillation: Secondary | ICD-10-CM | POA: Diagnosis not present

## 2023-07-23 DIAGNOSIS — F322 Major depressive disorder, single episode, severe without psychotic features: Secondary | ICD-10-CM | POA: Diagnosis not present

## 2023-07-23 DIAGNOSIS — I5033 Acute on chronic diastolic (congestive) heart failure: Secondary | ICD-10-CM | POA: Diagnosis not present

## 2023-07-23 DIAGNOSIS — I272 Pulmonary hypertension, unspecified: Secondary | ICD-10-CM | POA: Diagnosis not present

## 2023-07-23 DIAGNOSIS — J439 Emphysema, unspecified: Secondary | ICD-10-CM | POA: Diagnosis not present

## 2023-07-23 DIAGNOSIS — M797 Fibromyalgia: Secondary | ICD-10-CM | POA: Diagnosis not present

## 2023-07-23 DIAGNOSIS — I872 Venous insufficiency (chronic) (peripheral): Secondary | ICD-10-CM | POA: Diagnosis not present

## 2023-07-23 DIAGNOSIS — F419 Anxiety disorder, unspecified: Secondary | ICD-10-CM | POA: Diagnosis not present

## 2023-07-23 DIAGNOSIS — D631 Anemia in chronic kidney disease: Secondary | ICD-10-CM | POA: Diagnosis not present

## 2023-07-23 DIAGNOSIS — E1122 Type 2 diabetes mellitus with diabetic chronic kidney disease: Secondary | ICD-10-CM | POA: Diagnosis not present

## 2023-07-23 DIAGNOSIS — G43909 Migraine, unspecified, not intractable, without status migrainosus: Secondary | ICD-10-CM | POA: Diagnosis not present

## 2023-07-23 DIAGNOSIS — I700 Atherosclerosis of aorta: Secondary | ICD-10-CM | POA: Diagnosis not present

## 2023-07-23 DIAGNOSIS — I5022 Chronic systolic (congestive) heart failure: Secondary | ICD-10-CM | POA: Diagnosis not present

## 2023-07-23 DIAGNOSIS — M810 Age-related osteoporosis without current pathological fracture: Secondary | ICD-10-CM | POA: Diagnosis not present

## 2023-07-23 DIAGNOSIS — J4489 Other specified chronic obstructive pulmonary disease: Secondary | ICD-10-CM | POA: Diagnosis not present

## 2023-07-23 DIAGNOSIS — M545 Low back pain, unspecified: Secondary | ICD-10-CM | POA: Diagnosis not present

## 2023-07-23 DIAGNOSIS — I50812 Chronic right heart failure: Secondary | ICD-10-CM | POA: Diagnosis not present

## 2023-07-23 DIAGNOSIS — N182 Chronic kidney disease, stage 2 (mild): Secondary | ICD-10-CM | POA: Diagnosis not present

## 2023-07-25 ENCOUNTER — Other Ambulatory Visit (HOSPITAL_COMMUNITY): Payer: Self-pay

## 2023-07-27 ENCOUNTER — Telehealth: Payer: Self-pay

## 2023-07-27 ENCOUNTER — Other Ambulatory Visit: Payer: Self-pay

## 2023-07-27 ENCOUNTER — Encounter: Payer: Self-pay | Admitting: Hematology

## 2023-07-27 NOTE — Telephone Encounter (Signed)
 Pt reports result of PST FS POC INR 2.8 on 25mg  warfarin/wk. DECREASED to 21.25 mg warfarin/wk as: Su-2.5mg M-2.5mg T-2.5mg W-5mg Th-2.5mg F-2.5mg Sa-3.75mg  Repeat PST INR Monday 23-JUN-25.No bleeding reported. No new medications.

## 2023-07-27 NOTE — Telephone Encounter (Signed)
 Copied from CRM [id number]. Topic: Clinical - Home Health Verbal Orders >> Jul 27, 2023 11:33 AM Retta Caster wrote: Caller/Agency: Cortland Ding from [place] home [facility] Callback Number: [phone] Service Requested: Physical Therapy Frequency: 1 week 2 3 week 1 week  1 for Strength and ballance  Any new concerns about the patient? No

## 2023-07-28 ENCOUNTER — Other Ambulatory Visit (HOSPITAL_COMMUNITY): Payer: Self-pay

## 2023-07-28 NOTE — Telephone Encounter (Signed)
 RTC to [place] from [facility].  Need clarification as to frequency.  Message left to call the Clinics.

## 2023-07-29 ENCOUNTER — Other Ambulatory Visit: Payer: Self-pay

## 2023-07-29 MED ORDER — LIDOCAINE 5 % EX PTCH: 1.0000 | MEDICATED_PATCH | CUTANEOUS | Status: DC

## 2023-07-29 NOTE — Telephone Encounter (Signed)
 Medication last refilled 07/01/23 and was sent as no print instead of normal. Please resend rx.

## 2023-07-30 ENCOUNTER — Other Ambulatory Visit (HOSPITAL_COMMUNITY): Payer: Self-pay

## 2023-08-03 ENCOUNTER — Telehealth (HOSPITAL_BASED_OUTPATIENT_CLINIC_OR_DEPARTMENT_OTHER): Payer: Self-pay

## 2023-08-03 ENCOUNTER — Telehealth: Payer: Self-pay | Admitting: Pharmacist

## 2023-08-03 DIAGNOSIS — G4734 Idiopathic sleep related nonobstructive alveolar hypoventilation: Secondary | ICD-10-CM

## 2023-08-03 NOTE — Telephone Encounter (Signed)
 Copied from CRM [id number]. Topic: Clinical - Order For Equipment >> Jul 30, 2023  1:04 PM Celestine FALCON wrote: Reason for CRM: Pt is requesting a new script to be sent to [facility] for her CPAP machine and supplies. Pt was told she needs a new script every year from her pulm provider. She now sees Dr. Jude last visit was on 12/11/2022. Pt would like Dr. Jude to sen this script over to [facility]. Pt's phone number is [phone].

## 2023-08-03 NOTE — Telephone Encounter (Signed)
 Pt provides result of PST FS POC INR determination = 2.1 on 21.25 mg warfarin/wk. No bleeding, no new medications, no missed doses. Will INCREASE to 22.5 mg warfarin/wk as: Su-2.5mg M-2.5mg T-2.5mg W-5mg Th-2.5mg F-2.5mg Sa-5mg  using her GREEN 2.5mg  tablets.

## 2023-08-04 NOTE — Telephone Encounter (Signed)
Evaluation and management procedures were performed by the Clinical Pharmacy Practitioner under my supervision and collaboration. I have reviewed the Practitioner's note and chart, and I agree with the management and plan as documented above. ° °

## 2023-08-05 ENCOUNTER — Other Ambulatory Visit (HOSPITAL_COMMUNITY): Payer: Self-pay

## 2023-08-05 ENCOUNTER — Other Ambulatory Visit: Payer: Self-pay

## 2023-08-05 ENCOUNTER — Telehealth: Payer: Self-pay | Admitting: *Deleted

## 2023-08-05 ENCOUNTER — Other Ambulatory Visit: Payer: Self-pay | Admitting: Internal Medicine

## 2023-08-05 ENCOUNTER — Encounter: Payer: Self-pay | Admitting: Hematology

## 2023-08-05 DIAGNOSIS — I5033 Acute on chronic diastolic (congestive) heart failure: Secondary | ICD-10-CM

## 2023-08-05 DIAGNOSIS — M19041 Primary osteoarthritis, right hand: Secondary | ICD-10-CM

## 2023-08-05 DIAGNOSIS — D509 Iron deficiency anemia, unspecified: Secondary | ICD-10-CM

## 2023-08-05 DIAGNOSIS — K219 Gastro-esophageal reflux disease without esophagitis: Secondary | ICD-10-CM

## 2023-08-05 DIAGNOSIS — F5104 Psychophysiologic insomnia: Secondary | ICD-10-CM

## 2023-08-05 DIAGNOSIS — E1169 Type 2 diabetes mellitus with other specified complication: Secondary | ICD-10-CM

## 2023-08-05 DIAGNOSIS — E1142 Type 2 diabetes mellitus with diabetic polyneuropathy: Secondary | ICD-10-CM

## 2023-08-05 DIAGNOSIS — F321 Major depressive disorder, single episode, moderate: Secondary | ICD-10-CM

## 2023-08-05 DIAGNOSIS — G8929 Other chronic pain: Secondary | ICD-10-CM

## 2023-08-05 MED ORDER — BUPROPION HCL ER (XL) 150 MG PO TB24
150.0000 mg | ORAL_TABLET | ORAL | 3 refills | Status: AC
  Filled 2023-08-05 – 2023-11-05 (×2): qty 90, 90d supply, fill #0
  Filled 2024-02-02: qty 90, 90d supply, fill #1

## 2023-08-05 MED ORDER — INSULIN DEGLUDEC 100 UNIT/ML ~~LOC~~ SOPN
30.0000 [IU] | PEN_INJECTOR | Freq: Every morning | SUBCUTANEOUS | 6 refills | Status: DC
  Filled 2023-08-05: qty 3, 10d supply, fill #0

## 2023-08-05 MED ORDER — FUROSEMIDE 40 MG PO TABS
40.0000 mg | ORAL_TABLET | Freq: Every day | ORAL | 11 refills | Status: AC
  Filled 2023-08-05 – 2023-08-27 (×5): qty 30, 30d supply, fill #0
  Filled 2023-09-21: qty 30, 30d supply, fill #1
  Filled 2023-11-02: qty 30, 30d supply, fill #2
  Filled 2023-12-01: qty 30, 30d supply, fill #3
  Filled 2024-01-04: qty 30, 30d supply, fill #4
  Filled 2024-02-02: qty 30, 30d supply, fill #5
  Filled 2024-03-01: qty 30, 30d supply, fill #6

## 2023-08-05 MED ORDER — CARVEDILOL 6.25 MG PO TABS
6.2500 mg | ORAL_TABLET | Freq: Two times a day (BID) | ORAL | 11 refills | Status: AC
  Filled 2023-08-05: qty 60, 30d supply, fill #0
  Filled 2023-08-22 – 2023-08-28 (×2): qty 60, 30d supply, fill #1
  Filled 2023-09-28: qty 180, 90d supply, fill #2
  Filled 2024-01-04: qty 180, 90d supply, fill #3

## 2023-08-05 MED ORDER — GABAPENTIN 600 MG PO TABS
600.0000 mg | ORAL_TABLET | Freq: Every day | ORAL | 3 refills | Status: AC
  Filled 2023-08-05 – 2023-10-15 (×3): qty 90, 90d supply, fill #0
  Filled 2024-01-11: qty 90, 90d supply, fill #1

## 2023-08-05 MED ORDER — OMEPRAZOLE 40 MG PO CPDR
40.0000 mg | DELAYED_RELEASE_CAPSULE | Freq: Two times a day (BID) | ORAL | 5 refills | Status: DC
  Filled 2023-08-05 – 2023-08-16 (×2): qty 60, 30d supply, fill #0
  Filled 2023-09-13: qty 60, 30d supply, fill #1
  Filled 2023-10-13: qty 60, 30d supply, fill #2
  Filled 2023-11-25: qty 60, 30d supply, fill #3
  Filled 2024-01-04: qty 60, 30d supply, fill #4
  Filled 2024-02-02: qty 60, 30d supply, fill #5

## 2023-08-05 MED ORDER — HYDROCODONE-ACETAMINOPHEN 5-325 MG PO TABS
2.0000 | ORAL_TABLET | Freq: Two times a day (BID) | ORAL | 0 refills | Status: DC
  Filled 2023-08-05: qty 120, 30d supply, fill #0

## 2023-08-05 MED ORDER — DICLOFENAC SODIUM 1 % EX GEL
4.0000 g | Freq: Three times a day (TID) | CUTANEOUS | 11 refills | Status: AC | PRN
  Filled 2023-08-05: qty 100, 13d supply, fill #0

## 2023-08-05 MED ORDER — ALPRAZOLAM 0.5 MG PO TABS
0.5000 mg | ORAL_TABLET | Freq: Every day | ORAL | 5 refills | Status: DC
  Filled 2023-08-05: qty 30, 30d supply, fill #0
  Filled 2023-09-02: qty 30, 30d supply, fill #1
  Filled 2023-09-28 – 2023-10-02 (×3): qty 30, 30d supply, fill #2
  Filled 2023-11-02: qty 30, 30d supply, fill #3
  Filled 2023-12-01: qty 30, 30d supply, fill #4
  Filled 2024-01-04: qty 30, 30d supply, fill #5

## 2023-08-05 MED ORDER — SACUBITRIL-VALSARTAN 49-51 MG PO TABS
1.0000 | ORAL_TABLET | Freq: Two times a day (BID) | ORAL | 11 refills | Status: DC
Start: 2023-08-05 — End: 2023-09-13
  Filled 2023-08-05: qty 60, 30d supply, fill #0
  Filled 2023-08-22 – 2023-08-28 (×2): qty 60, 30d supply, fill #1

## 2023-08-05 MED ORDER — INSULIN ASPART 100 UNIT/ML IJ SOLN
4.0000 [IU] | Freq: Three times a day (TID) | INTRAMUSCULAR | 3 refills | Status: DC
  Filled 2023-08-05: qty 10, 28d supply, fill #0

## 2023-08-05 NOTE — Telephone Encounter (Signed)
 Called pt - no answer. VM left per pt's request from our previous conversation (she requested to leave a message b/c she was going to lie down) of refills sent to [facility] pharmacy. And to call back for any questions.

## 2023-08-05 NOTE — Telephone Encounter (Signed)
 Return call from pt who stated she has not been on any BP meds that's she's aware of. Stated her BP has been running 170's/70. I asked her if she 's taking Carvedilol , Lasix , and Spironolactone ; pt stated she's only taking Lasix  and not aware of the other 2. Stated she had sent a copy of her meds to our office. Next appt is 7/3 with her PCP.Kin

## 2023-08-05 NOTE — Telephone Encounter (Signed)
 Copied from CRM [id number]. Topic: Clinical - Medication Question >> Aug 05, 2023 12:02 PM Laurier C wrote: Reason for CRM:  Patient's nurse Powell called concerned about the patient's medications. Patient is unsure if she is taking meds for blood pressure. Please give the patient a call back at [phone] to assist with medication clarification

## 2023-08-05 NOTE — Telephone Encounter (Signed)
 Called pt - no answer; left message on vm to call the office about her medications.

## 2023-08-06 ENCOUNTER — Other Ambulatory Visit (HOSPITAL_COMMUNITY): Payer: Self-pay

## 2023-08-06 ENCOUNTER — Other Ambulatory Visit: Payer: Self-pay

## 2023-08-07 ENCOUNTER — Ambulatory Visit: Payer: Self-pay

## 2023-08-07 ENCOUNTER — Other Ambulatory Visit (HOSPITAL_COMMUNITY): Payer: Self-pay

## 2023-08-07 NOTE — Telephone Encounter (Signed)
 FYI Only or Action Required?: FYI only for provider.  Patient was last seen in primary care on 07/15/2023 by Fernand Prost, MD. Called Nurse Triage reporting Hypertension. Symptoms began several days ago. Interventions attempted: Nothing. Symptoms are: gradually worsening.  Triage Disposition: See PCP When Office is Open (Within 3 Days)  Patient/caregiver understands and will follow disposition?: Yes   Pt is waiting on her BP medication to be delivered today and then she will take her medication as she has been without it for days. Instructed to recheck BP a hour or two after taking medications. Instructed pt on symptoms to go to ED for as of now pt is asymptomatic.  Has hospital f/u for Thursday.                   Copied from CRM [id number]. Topic: Clinical - Red Word Triage >> Aug 07, 2023 11:21 AM Mercer PEDLAR wrote: Red Word that prompted transfer to Nurse Triage: BP is 198/74, patient requesting to speak to NT. Reason for Disposition  Systolic BP  >= 160 OR Diastolic >= 100  Answer Assessment - Initial Assessment Questions 1. BLOOD PRESSURE: What is the blood pressure? Did you take at least two measurements 5 minutes apart?     198/74 2. ONSET: When did you take your blood pressure?     This morning 3. HOW: How did you take your blood pressure? (e.g., automatic home BP monitor, visiting nurse)     Automatic cuff 4. HISTORY: Do you have a history of high blood pressure?     yes 5. MEDICINES: Are you taking any medicines for blood pressure? Have you missed any doses recently?     Yes  6. OTHER SYMPTOMS: Do you have any symptoms? (e.g., blurred vision, chest pain, difficulty breathing, headache, weakness)     No  Protocols used: Blood Pressure - High-A-AH

## 2023-08-10 ENCOUNTER — Other Ambulatory Visit (HOSPITAL_COMMUNITY): Payer: Self-pay

## 2023-08-10 ENCOUNTER — Telehealth: Payer: Self-pay | Admitting: Internal Medicine

## 2023-08-10 ENCOUNTER — Other Ambulatory Visit: Payer: Self-pay

## 2023-08-10 DIAGNOSIS — Z7901 Long term (current) use of anticoagulants: Secondary | ICD-10-CM | POA: Diagnosis not present

## 2023-08-10 DIAGNOSIS — I480 Paroxysmal atrial fibrillation: Secondary | ICD-10-CM | POA: Diagnosis not present

## 2023-08-10 MED ORDER — LIDOCAINE 5 % EX PTCH: 1.0000 | MEDICATED_PATCH | CUTANEOUS | Status: DC

## 2023-08-10 NOTE — Telephone Encounter (Signed)
 Last rx sent 07/29/23 as no print instead of normal. Please resend rx.

## 2023-08-10 NOTE — Telephone Encounter (Signed)
 Message has been forwarded to [employer].   Copied from CRM [id number]. Topic: General - Other >> Aug 10, 2023 12:03 PM Dominique A wrote: Reason for CRM: Patient is wanting to get in touch with [facility].  Patient states she use to have direct contact with her but the number she has for her is disconnected. Patient would not elaborate on why she is wanting to speak with her but would like for her to call her back.

## 2023-08-10 NOTE — Telephone Encounter (Signed)
 Pt reports INR results of PST FS POC INR today = 2.5 mg on 22.5 mg warfarin/wk. Target range 2.0 - 2.5. Reduced dose to 20 mg warfarin/wk as: Su-2.5mg M-2.5mg T-2.5mg W-3.75mg Th-2.5mg F-2.5mg Sa-3.75mg  No bleeding, no new medications, no missed doses.  Repeat PST FS POC INR on 7-JUL-25.

## 2023-08-10 NOTE — Telephone Encounter (Signed)
 Left a message with my phone number for her to return call. [phone]

## 2023-08-11 NOTE — Telephone Encounter (Signed)
 Reviewed blood sugars on lower dose of Tresiba , x 1 month. Time in range for 14 days 78%.

## 2023-08-12 NOTE — Progress Notes (Signed)
 73 y.o. [name] is here for SNF discharge f/u visit, and to review her medical conditions since recent hospitalization  She was hospitalized from May 8 to Jul 01, 2023 for management of acute exacerbation of HFpEF with comorbidities including pulmonary hypertension, history of MVR with bioprosthetic, PAF on warfarin, and COPD intermittently on supplemental oxygen .  She was treated with IV diuretics.  Aldactone  was added.  Home losartan  was changed to Entresto .  Lopressor  was changed to carvedilol  (low dose, as she experienced asymptomatic bradycardia HR 40-50s).  Home dose of Lasix  was increased from 20 daily to 40 mg daily.  Dry weight at discharge was estimated to be around 130 pounds.  Cardiology had recommended addition of Jardiance  at a future office visit.  She has been experiencing decline in physical function with frequent falls, and did sustain a fall in the [facility] resulting in right shoulder pain suspected due to possible rotator cuff injury.  Discharged to [name], where she remained until discharge on.  While at [name], she sustained a fall with head contusion and hematoma evaluated in the ED on 07/07/2023.  [facility] follow-up visit in [name] occurred on 07/15/2023, at which point she was symptomatically hypotensive at 96/38, HR 62.  She was very dizzy when orthostatic vitals were attempted; supine BP 130/33, standing 114/37.  Sleepy during interview which was concerning for hypercarbic respiratory failure or cerebral hypoperfusion from diuretic induced volume depletion.  Also noted to be pale and with a history of severe iron  deficiency and ongoing occult blood loss anemia, she was transferred to the ED for stat labs-Hgb 10.9, glucose 116, albumin  3.1, GFR 59, INR 2.0.  Given her recent fall with head injury, head CT was performed which showed no acute intracranial concern, and decrease in size of left frontal scalp hematoma.  She received a liter of IVF.  She was treated  empirically for asymptomatic UTI with fosfomycin based on UA with 6-10 WBCs and rare bacteria.  Polypharmacy was suspected as a potential contributor to her mental status, and she was able to demonstrate alertness after 8 hours of observation and ability to walk before returning back to [name].  She has since then had an office visit on 07/20/2018 225 with [name] for her iron  deficiency anemia, where it was noted that her ferritin was greater than 200 and iron  sat greater than 20% at goal, with Hgb remaining stable in high tens.  Plan to follow-up there in 6 months..   Tresiba  25 units (self lowered)  Meds reconciled one by one.  CPAP is new, effective, using 2-2.5 L 02  Just received carvedilol  and entresto  Monday (started in [facility], continued at [facility], new in outpatient setting).  HHPT in progress, waiting on HHOT to begin.  PT is working L leg pain and strengthening (after her hip fracture). He has been doing muscle masaging, which has been very helpful.   Unfortunately though she's had two significant falls in past couple weeks where the leg gave out.    Needs a light weight transport chair (not a WC).    Having night time chest pain; could be sitting up, not necessarily when laying down, more on left side anterior, feels deep.  No associated nausea, radiation, dyspnea, cough, palpitations.  Moving chest/arms doesn't affect it.  Every night, happening more intensely, can last hours, nothing exacerbates or relieves.    No dizziness or lightheaded feelings.    Patient Active Problem List   Diagnosis Date Noted  Atypical chest pain 08/21/2023   S/P MVR (mitral valve replacement) 06/23/2023   Malnutrition of moderate degree 06/19/2023   Iron  deficiency anemia 05/19/2023   IDA (iron  deficiency anemia) 05/19/2023   Chronic anticoagulation 10/18/2022   History of fracture of left hip 10/17/2022   Generalized pain 08/01/2022   Multifactorial functional impairment 02/13/2022    History of stroke without residual deficits 01/17/2022   Diabetic peripheral neuropathy associated with type 2 diabetes mellitus (HCC) 11/11/2021   Recurrent falls 11/11/2021   Gait instability 09/19/2021   Perennial non-allergic rhinitis 09/19/2021   Chronic, continuous use of opioids for chronic pain 07/10/2021   Polypharmacy 11/08/2020   Personal history of renal cell carcinoma 09/12/2020   Tinea pedis of both feet 08/07/2020   Essential tremor 11/17/2019   Nocturnal hypoxia per sleep study 07/2019    Carotid artery stenosis; s/p R endarterectomy    COPD mixed type (HCC)    Arthritis of both hands 07/01/2019   Stage 2 chronic kidney disease due to type 2 diabetes mellitus (HCC) 12/16/2018   Lichen sclerosus of female genitalia 01/12/2017   Mixed stress and urge urinary incontinence 05/27/2016   Pulmonary hypertension due to chronic obstructive pulmonary disease (HCC) 04/25/2016   Asymptomatic cholelithiasis 09/25/2015   Chronic iron  deficiency anemia 12/26/2014   Aortic atherosclerosis (HCC) 10/19/2014   Right nephrolithiasis, asymptomatic (incidental finding) 09/06/2014   Headache, chronic intermittent 01/14/2013   Chronic low back pain 10/06/2012   Long term current use of anticoagulant therapy 09/30/2012   Recurrent candidal intertrigo 07/28/2012   Tobacco use disorder 07/28/2012   History of colonic polyps 05/14/2011   Sleep disturbance 05/14/2011   Abdominal wall hernia 05/14/2011   Constipation due to opioid therapy 02/03/2011   Health care maintenance 11/04/2010   PAF (paroxysmal atrial fibrillation) (HCC) 10/22/2010   Chronic heart failure with preserved ejection fraction (HFpEF) (HCC) 10/21/2010   Fibromyalgia 08/29/2010   Gastroesophageal reflux disease    Type 2 diabetes mellitus with complication, with long-term current use of insulin  (HCC)    Osteoporosis with current pathological fracture with routine healing    Peripheral arterial disease (HCC) 05/27/2010    History of mitral valve replacement with bioprosthetic valve 2012   Systolic essential hypertension 06/19/2008   Hyperlipidemia 11/20/2005   Severe major depression (HCC) 11/19/2005    Current Outpatient Medications:    albuterol  (VENTOLIN  HFA) 108 (90 Base) MCG/ACT inhaler, Inhale 2 puffs into the lungs every 6 hours as needed for shortness of breath, Disp: 54 g, Rfl: 10   ALPRAZolam  (XANAX ) 0.5 MG tablet, Take 1 tablet (0.5 mg total) by mouth at bedtime. To help with sleep., Disp: 30 tablet, Rfl: 5   buPROPion  (WELLBUTRIN  XL) 150 MG 24 hr tablet, Take 1 tablet (150 mg total) by mouth every morning., Disp: 90 tablet, Rfl: 3   carvedilol  (COREG ) 6.25 MG tablet, Take 1 tablet (6.25 mg total) by mouth 2 (two) times daily with a meal., Disp: 60 tablet, Rfl: 11   Continuous Glucose Sensor (FREESTYLE LIBRE 3 PLUS SENSOR) MISC, Change sensor every 15 days., Disp: 6 each, Rfl: 3   diclofenac  Sodium (VOLTAREN ) 1 % GEL, Apply 4 g topically 3 (three) times daily as needed (joint pain)., Disp: 50 g, Rfl: 11   Fluticasone -Umeclidin-Vilant (TRELEGY ELLIPTA ) 100-62.5-25 MCG/ACT AEPB, Inhale 1 puff into the lungs daily., Disp: 180 each, Rfl: 3   furosemide  (LASIX ) 40 MG tablet, Take 1 tablet (40 mg total) by mouth daily., Disp: 30 tablet, Rfl: 11   gabapentin  (  NEURONTIN ) 600 MG tablet, Take 1 tablet (600 mg total) by mouth at bedtime., Disp: 90 tablet, Rfl: 3   HYDROcodone -acetaminophen  (NORCO/VICODIN) 5-325 MG tablet, Take 2 tablets by mouth 2 (two) times daily., Disp: 120 tablet, Rfl: 0   insulin  degludec (TRESIBA ) 100 UNIT/ML FlexTouch Pen, Inject 19 Units into the skin every morning., Disp: 3 mL, Rfl: 6   Insulin  Pen Needle 32G X 4 MM MISC, Use to inject insulin  daily, Disp: 100 each, Rfl: 3   ipratropium (ATROVENT ) 0.03 % nasal spray, Place 2 sprays into the nose 3 (three) times daily., Disp: 30 mL, Rfl: 0   ipratropium-albuterol  (DUONEB) 0.5-2.5 (3) MG/3ML SOLN, Inhale 3 mLs by nebulization every 6 (six)  hours as needed (shortness of breath, wheezing)., Disp: 90 mL, Rfl: 3   lidocaine  (LIDODERM ) 5 %, Place 1 or 2 patches to painful area of back each day.  Remove & Discard patch within 12 hours or as directed by MD, Disp: 60 patch, Rfl: 2   linaclotide  (LINZESS ) 72 MCG capsule, Take 1 capsule (72 mcg total) by mouth daily before breakfast., Disp: 90 capsule, Rfl: 3   omeprazole  (PRILOSEC) 40 MG capsule, Take 1 capsule (40 mg total) by mouth 2 (two) times daily., Disp: 60 capsule, Rfl: 5   ondansetron  (ZOFRAN ) 4 MG tablet, Take 1 tablet (4 mg total) by mouth daily as needed for nausea or vomiting., Disp: 30 tablet, Rfl: 1   OXYGEN , Inhale 2 L into the lungs at bedtime., Disp: , Rfl:    potassium chloride  (KLOR-CON  M) 10 MEQ tablet, Take 1 tablet (10 mEq total) by mouth daily., Disp: 30 tablet, Rfl: 11   ramelteon  (ROZEREM ) 8 MG tablet, Take 1 tablet (8 mg total) by mouth at bedtime., Disp: 30 tablet, Rfl: 3   sacubitril -valsartan  (ENTRESTO ) 97-103 MG, Take 1 tablet by mouth 2 (two) times daily., Disp: 180 tablet, Rfl: 3   venlafaxine  XR (EFFEXOR -XR) 150 MG 24 hr capsule, Take 1 capsule (150 mg total) by mouth daily with breakfast., Disp: 90 capsule, Rfl: 3   warfarin (COUMADIN ) 2.5 MG tablet, Current instructions:  Take 1 & 1/2 of your 2.5 mg strength warfarin tablets on Mondays, Wednesdays and Fridays. All OTHER days, take ONLY ONE (1) tablet. Continue to perform your patient self testing, finger-stick, point of care INRs and report results to Pharmacist, Lynwood Lites., Disp: 108 tablet, Rfl: 1  Functional Status: Not moving around much; has walker. Needs assistance with tasks due to weakness and balance issues, though independent in IADLs. Continues to live alone. Sister Rojelio is her advocate, attends most appointments when she can.   Objective BP 139/82 (BP Location: Right Arm, Patient Position: Sitting, Cuff Size: Small)   Pulse (!) 50   Temp 97.8 F (36.6 C) (Oral)   Wt 138 lb (62.6 kg)    SpO2 92%   BMI 25.24 kg/m   Exam:  Appears well, euvolemic, chronic dependent duskiness of lower legs and feet, heart today is regular, lungs are clear.  No tremor, affect is positive, bright and clear mentation.   Assessment and plan:  Today was largely a review appointment as there have been many changes in regimen since prior to hospitalization from which she was discharged to SNF.  Plugged in with many specialists. No changes today but will have her come back for close f/u to ensure clinical stability. Medications are delivered by [facility].   Type 2 diabetes mellitus with complication, with long-term current use of insulin  [facility]) Assessment & Plan:  A1c 5.9 in May 2025. Tresiba  had been adjusted down, noted.  Recurrent falls -     Ambulatory Referral for DME  Gait instability -     Ambulatory Referral for DME  Tobacco use disorder -     CT CHEST LUNG CANCER SCREENING LOW DOSE WO CONTRAST; Future

## 2023-08-12 NOTE — Telephone Encounter (Signed)
 Patient called to let us  know that she lowered her dose of Tresiba  to 25 units daily due to hypoglycemia about 1 month ago. Her current 14 day CGM report shows one hypoglycemia event and 77% in target. Patient was commended for her diabetes self management.she is coming in this Thursday to see Dr. Trudy.

## 2023-08-13 ENCOUNTER — Other Ambulatory Visit (HOSPITAL_COMMUNITY): Payer: Self-pay

## 2023-08-13 ENCOUNTER — Ambulatory Visit: Payer: Self-pay | Admitting: Internal Medicine

## 2023-08-13 VITALS — BP 139/82 | HR 50 | Temp 97.8°F | Wt 138.0 lb

## 2023-08-13 DIAGNOSIS — E1169 Type 2 diabetes mellitus with other specified complication: Secondary | ICD-10-CM

## 2023-08-13 DIAGNOSIS — Z794 Long term (current) use of insulin: Secondary | ICD-10-CM

## 2023-08-13 DIAGNOSIS — F17200 Nicotine dependence, unspecified, uncomplicated: Secondary | ICD-10-CM

## 2023-08-13 DIAGNOSIS — R296 Repeated falls: Secondary | ICD-10-CM

## 2023-08-13 DIAGNOSIS — R2681 Unsteadiness on feet: Secondary | ICD-10-CM | POA: Diagnosis not present

## 2023-08-13 DIAGNOSIS — E118 Type 2 diabetes mellitus with unspecified complications: Secondary | ICD-10-CM

## 2023-08-13 MED ORDER — INSULIN DEGLUDEC 100 UNIT/ML ~~LOC~~ SOPN
25.0000 [IU] | PEN_INJECTOR | Freq: Every morning | SUBCUTANEOUS | 6 refills | Status: DC
  Filled 2023-08-13 – 2023-08-17 (×3): qty 3, 12d supply, fill #0
  Filled 2023-08-17: qty 6, 24d supply, fill #0

## 2023-08-13 NOTE — Assessment & Plan Note (Addendum)
 A1c 5.9 in May 2025. Tresiba  had been adjusted down, noted.

## 2023-08-13 NOTE — Patient Instructions (Addendum)
 Minta,  You look good, even if you don't feel that way.  I'm glad we had a chance to go through your medicines so we are on the same page.  Also happy your diabetes is doing well (as is your blood pressure, based on our cuff).   The falls are concerning.    No medication changes at this time.  Next visit we'll check some blood work (kidneys, A1c, blood counts).   bE careful and safe,  Dr. Trudy

## 2023-08-16 DIAGNOSIS — J449 Chronic obstructive pulmonary disease, unspecified: Secondary | ICD-10-CM | POA: Diagnosis not present

## 2023-08-16 DIAGNOSIS — I509 Heart failure, unspecified: Secondary | ICD-10-CM | POA: Diagnosis not present

## 2023-08-16 DIAGNOSIS — I270 Primary pulmonary hypertension: Secondary | ICD-10-CM | POA: Diagnosis not present

## 2023-08-16 DIAGNOSIS — J439 Emphysema, unspecified: Secondary | ICD-10-CM | POA: Diagnosis not present

## 2023-08-17 ENCOUNTER — Ambulatory Visit: Admitting: Student

## 2023-08-17 ENCOUNTER — Other Ambulatory Visit (HOSPITAL_COMMUNITY): Payer: Self-pay

## 2023-08-17 ENCOUNTER — Telehealth: Payer: Self-pay | Admitting: Pharmacist

## 2023-08-17 ENCOUNTER — Other Ambulatory Visit: Payer: Self-pay | Admitting: Pharmacist

## 2023-08-17 ENCOUNTER — Other Ambulatory Visit: Payer: Self-pay

## 2023-08-17 MED ORDER — WARFARIN SODIUM 2.5 MG PO TABS
ORAL_TABLET | ORAL | 1 refills | Status: DC
  Filled 2023-08-17: qty 108, 88d supply, fill #0
  Filled 2023-11-25: qty 108, 88d supply, fill #1

## 2023-08-17 NOTE — Telephone Encounter (Signed)
 Requests reconciliation of her warfarin that she sees on her MyChart. Should be 2.5 mg strength, green warfarin tablets. Requesting refill of the CORRECT strength as described above. IF 3 mg and 5 mg strength tablets are found on her med list, I will d/c.

## 2023-08-17 NOTE — Telephone Encounter (Signed)
 Pt reports results of PST FS POC INR = 2.1 (Target range 2.0 - 2.5). No bleeding, no new medications, no missed doses. Advised to INCREASE to 1&1/2 x 2.5 mg strength warfarin tabs on MWF; 1x2.5 mg all other days. Repeat INR 14JUL25. Patient states she viewed her MyChart and found two warfarin prescriptions for 3 mg and 5 mg strength tablets. I will check and if I do see that, will CHANGE to the 2.5 mg strength, green-warfarin tablets that she has been using for some time now. Indicates that her pharmacy of choice is [facility].

## 2023-08-18 ENCOUNTER — Other Ambulatory Visit: Payer: Self-pay

## 2023-08-18 ENCOUNTER — Ambulatory Visit: Payer: Self-pay

## 2023-08-18 NOTE — Telephone Encounter (Signed)
 FYI Only or Action Required?: FYI only for provider.  Patient was last seen in primary care on 08/13/2023 by Trudy Mliss Dragon, MD.  Called Nurse Triage reporting Fall.  Symptoms began x 2 weeks.  Interventions attempted: Prescription medications: hydrocodone .  Symptoms are: unchanged.  Triage Disposition: See PCP When Office is Open (Within 3 Days)  Patient/caregiver understands and will follow disposition?: Yes     Copied from CRM [id number]. Topic: Clinical - Red Word Triage >> Aug 18, 2023  5:39 PM Everette C wrote: [facility] that prompted transfer to Nurse Triage: The patient is concerned that they may have broken ribs during a recent fall on Saturday 08/15/23 Reason for Disposition  [1] MODERATE back pain (e.g., interferes with normal activities) AND [2] present > 3 days    Back pain 7/10 for x2 weeks after fall: referred patient to urgent care due to no appointment openings  Answer Assessment - Initial Assessment Questions 1. MECHANISM: How did the fall happen?     Tripped and fall 2. DOMESTIC VIOLENCE AND ELDER ABUSE SCREENING: Did you fall because someone pushed you or tried to hurt you? If Yes, ask: Are you safe now?     No and patient is safe  3. ONSET: When did the fall happen? (e.g., minutes, hours, or days ago)     08/08/2023 4. LOCATION: What part of the body hit the ground? (e.g., back, buttocks, head, hips, knees, hands, head, stomach)     Buttocks  5. INJURY: Did you hurt (injure) yourself when you fell? If Yes, ask: What did you injure? Tell me more about this? (e.g., body area; type of injury; pain severity)     Pain in back into front 6. PAIN: Is there any pain? If Yes, ask: How bad is the pain? (e.g., Scale 1-10; or mild,  moderate, severe)   - NONE (0): No pain   - MILD (1-3): Doesn't interfere with normal activities    - MODERATE (4-7): Interferes with normal activities or awakens from sleep    - SEVERE (8-10): Excruciating pain, unable to  do any normal activities      6/10 7. SIZE: For cuts, bruises, or swelling, ask: How large is it? (e.g., inches or centimeters)      na 8. PREGNANCY: Is there any chance you are pregnant? When was your last menstrual period?     na 9. OTHER SYMPTOMS: Do you have any other symptoms? (e.g., dizziness, fever, weakness; new onset or worsening).      no 10. CAUSE: What do you think caused the fall (or falling)? (e.g., tripped, dizzy spell)       Tripped  Pt fell x 2 weeks.  C/o of back pain 7/10: taking hydrocodone : gives short term relief  Answer Assessment - Initial Assessment Questions 1. ONSET: When did the pain begin?      08/08/2023 2. LOCATION: Where does it hurt? (upper, mid or lower back)     Low back and pain penetrates all the way through  3. SEVERITY: How bad is the pain?  (e.g., Scale 1-10; mild, moderate, or severe)   - MILD (1-3): Doesn't interfere with normal activities.    - MODERATE (4-7): Interferes with normal activities or awakens from sleep.    - SEVERE (8-10): Excruciating pain, unable to do any normal activities.      7/10 4. PATTERN: Is the pain constant? (e.g., yes, no; constant, intermittent)      constant 5. RADIATION: Does the pain shoot  into your legs or somewhere else?     no 6. CAUSE:  What do you think is causing the back pain?      fall 7. BACK OVERUSE:  Any recent lifting of heavy objects, strenuous work or exercise?     na 8. MEDICINES: What have you taken so far for the pain? (e.g., nothing, acetaminophen , NSAIDS)     Hydrocodone  without much relief 9. NEUROLOGIC SYMPTOMS: Do you have any weakness, numbness, or problems with bowel/bladder control?     no 10. OTHER SYMPTOMS: Do you have any other symptoms? (e.g., fever, abdomen pain, burning with urination, blood in urine)       no 11. PREGNANCY: Is there any chance you are pregnant? When was your last menstrual period?       na  Protocols used: Falls and  Falling-A-AH, Back Pain-A-AH

## 2023-08-18 NOTE — Telephone Encounter (Signed)
 FYI Only or Action Required?: FYI only for provider.  Patient was last seen in primary care on 08/13/2023 by Trudy Mliss Dragon, MD.  Called Nurse Triage reporting Chest Pain.  Symptoms began about a month ago.  Interventions attempted: Prescription medications: as prescribed and Rest, hydration, or home remedies.  Symptoms are: unchanged.  Triage Disposition: See Physician Within 24 Hours  Patient/caregiver understands and will follow disposition?: Yes  Copied from CRM [id number]. Topic: Clinical - Red Word Triage >> Aug 18, 2023  4:51 PM Mercer PEDLAR wrote: Red Word that prompted transfer to Nurse Triage: Chest pain. Reason for Disposition  [1] Chest pain lasts < 5 minutes AND [2] NO chest pain or cardiac symptoms (e.g., breathing difficulty, sweating) now  (Exception: Chest pains that last only a few seconds.)  Answer Assessment - Initial Assessment Questions 1. LOCATION: Where does it hurt?       Discomfort all over  2. RADIATION: Does the pain go anywhere else? (e.g., into neck, jaw, arms, back)     No  3. ONSET: When did the chest pain begin? (Minutes, hours or days)      One week ago 4. PATTERN: Does the pain come and go, or has it been constant since it started?  Does it get worse with exertion?      Intermittent-already evaluated 5. DURATION: How long does it last (e.g., seconds, minutes, hours)     Persistent, not worsening  6. SEVERITY: How bad is the pain?  (e.g., Scale 1-10; mild, moderate, or severe)    - MILD (1-3): doesn't interfere with normal activities     - MODERATE (4-7): interferes with normal activities or awakens from sleep    - SEVERE (8-10): excruciating pain, unable to do any normal activities       Mild  7. CARDIAC RISK FACTORS: Do you have any history of heart problems or risk factors for heart disease? (e.g., angina, prior heart attack; diabetes, high blood pressure, high cholesterol, smoker, or strong family history of heart disease)      yes 8. PULMONARY RISK FACTORS: Do you have any history of lung disease?  (e.g., blood clots in lung, asthma, emphysema, birth control pills)     yes 9. CAUSE: What do you think is causing the chest pain?     Indigestion 10. OTHER SYMPTOMS: Do you have any other symptoms? (e.g., dizziness, nausea, vomiting, sweating, fever, difficulty breathing, cough)      Denies  Additional info: She has already been evaluated by pcp for chest pain, calling today to schedule follow up as chest pain has been persisting, not getting any worse or better.  Acute follow up scheduled 08/19/23 with an alternate provider, discussed reasons to proceed to emergency room.  Protocols used: Chest Pain-A-AH

## 2023-08-19 ENCOUNTER — Telehealth: Payer: Self-pay | Admitting: *Deleted

## 2023-08-19 ENCOUNTER — Ambulatory Visit: Payer: Self-pay | Admitting: Student

## 2023-08-19 DIAGNOSIS — G4734 Idiopathic sleep related nonobstructive alveolar hypoventilation: Secondary | ICD-10-CM

## 2023-08-19 NOTE — Telephone Encounter (Signed)
 [name] stated ok for pt to wait until the Friday's appt.

## 2023-08-19 NOTE — Telephone Encounter (Signed)
 Return call to pt who stated her doctor is aware of the chest pain at the last OV. Pt stated she felled about 1 week ago, hit a glass top table. Now she's having chest and back pain. Stated she hurts when she coughs/ takes deep breaths. Stated she thinks she needs an x-ray. [facility] has scheduled pt an appt for Friday 7/11. Offered pt a sooner appt; stated she has transportation problems. Advised pt to go to the ED- stated she does not want to go. Also advised pt if pain/breathing worsen to go before her appt on Friday, to the ER/call 911.

## 2023-08-19 NOTE — Telephone Encounter (Signed)
 Copied from CRM [id number]. Topic: Clinical - Order For Equipment >> Jul 30, 2023  1:04 PM [name] wrote: Reason for CRM: Pt is requesting a new script to be sent to [facility] for her CPAP machine and supplies. Pt was told she needs a new script every year from her pulm provider. She now sees Dr. Jude last visit was on 12/11/2022. Pt would like Dr. Jude to sen this script over to [facility]. Pt's phone number is [phone]. >> Aug 19, 2023  9:17 AM [name] wrote: Patient [id number] states called requesting for refill on pap supplies 07/30/23 and has not heard from anyone yet. Please call back.   She said she spoke to someone already, so she is confused.  I asked if this has been resolved and she said no.  She said she got a call from [facility] stating that she needed a prescription for CPAP supplies.  She clarified that she did not need an order for a CPAP machine.  She was told that she had to have a new prescription every year and wanted to know when that started.  I advised her that it has always been that way, it is like a prescription for medication, you have to be seen once a year and have a new prescription sent in yearly.  I let her know it is sent in electronically is probably why she was unaware.  She verbalized this was all she needed.  New order for CPAP supplies sent to [facility].  Nothing further needed.

## 2023-08-19 NOTE — Telephone Encounter (Signed)
  Call from [facility], [facility].  Would like to continue PT HH 2 times a week for 4 weeks.  To work on balance,  strengthening and  pain reduction.   Is aware that patient recently fell and will await start if approved after patient's appointment in the Clinics on Friday.   Approval given .  Will forward to PCP for approval or denial of plan.                Copied from CRM [id number]. Topic: Clinical - Home Health Verbal Orders >> Aug 19, 2023 10:46 AM Marda MATSU wrote: Caller/Agency: Lorrene with [facility] Callback Number: [phone] Service Requested: Physical Therapy Frequency: 2w4  Any new concerns about the patient? Yes  patient  fell 1 week ago and is having increased pain back and ribs. Lorrene stated he would like to help her work on her balance,  strength and ways to reduce her pain.

## 2023-08-20 ENCOUNTER — Other Ambulatory Visit (HOSPITAL_COMMUNITY): Payer: Self-pay

## 2023-08-21 ENCOUNTER — Ambulatory Visit: Payer: Self-pay | Admitting: Internal Medicine

## 2023-08-21 VITALS — BP 153/41 | HR 48 | Temp 97.8°F | Resp 24 | Ht 62.0 in | Wt 136.6 lb

## 2023-08-21 DIAGNOSIS — I2723 Pulmonary hypertension due to lung diseases and hypoxia: Secondary | ICD-10-CM

## 2023-08-21 DIAGNOSIS — I5032 Chronic diastolic (congestive) heart failure: Secondary | ICD-10-CM

## 2023-08-21 DIAGNOSIS — I509 Heart failure, unspecified: Secondary | ICD-10-CM | POA: Diagnosis not present

## 2023-08-21 DIAGNOSIS — I110 Hypertensive heart disease with heart failure: Secondary | ICD-10-CM

## 2023-08-21 DIAGNOSIS — G4736 Sleep related hypoventilation in conditions classified elsewhere: Secondary | ICD-10-CM | POA: Diagnosis not present

## 2023-08-21 DIAGNOSIS — F17200 Nicotine dependence, unspecified, uncomplicated: Secondary | ICD-10-CM | POA: Diagnosis not present

## 2023-08-21 DIAGNOSIS — S72142D Displaced intertrochanteric fracture of left femur, subsequent encounter for closed fracture with routine healing: Secondary | ICD-10-CM | POA: Diagnosis not present

## 2023-08-21 DIAGNOSIS — R269 Unspecified abnormalities of gait and mobility: Secondary | ICD-10-CM | POA: Diagnosis not present

## 2023-08-21 DIAGNOSIS — G4734 Idiopathic sleep related nonobstructive alveolar hypoventilation: Secondary | ICD-10-CM | POA: Diagnosis not present

## 2023-08-21 DIAGNOSIS — R296 Repeated falls: Secondary | ICD-10-CM

## 2023-08-21 DIAGNOSIS — I270 Primary pulmonary hypertension: Secondary | ICD-10-CM | POA: Diagnosis not present

## 2023-08-21 DIAGNOSIS — J449 Chronic obstructive pulmonary disease, unspecified: Secondary | ICD-10-CM

## 2023-08-21 DIAGNOSIS — R0789 Other chest pain: Secondary | ICD-10-CM

## 2023-08-21 DIAGNOSIS — G9340 Encephalopathy, unspecified: Secondary | ICD-10-CM | POA: Diagnosis not present

## 2023-08-21 DIAGNOSIS — I10 Essential (primary) hypertension: Secondary | ICD-10-CM

## 2023-08-21 DIAGNOSIS — J9601 Acute respiratory failure with hypoxia: Secondary | ICD-10-CM | POA: Diagnosis not present

## 2023-08-21 DIAGNOSIS — J439 Emphysema, unspecified: Secondary | ICD-10-CM | POA: Diagnosis not present

## 2023-08-21 DIAGNOSIS — I272 Pulmonary hypertension, unspecified: Secondary | ICD-10-CM | POA: Diagnosis not present

## 2023-08-21 MED ORDER — NITROGLYCERIN 0.4 MG SL SUBL
0.4000 mg | SUBLINGUAL_TABLET | Freq: Once | SUBLINGUAL | Status: AC
  Administered 2023-08-21: 0.4 mg via SUBLINGUAL

## 2023-08-21 NOTE — Patient Instructions (Signed)
 Minta,  I'm glad you have appointments coming up with the cardiologist and for your lung cancer screening CT.  Keep me posted.  If you have more severe chest pain, or a pressure sensation, or it occurs with problems breathing, you must call 911.  Your friend,  Dr. Trudy

## 2023-08-21 NOTE — Progress Notes (Signed)
 73 y.o. [name] is here for problem visit to address subacute chest pain which has been occurring for several weeks with increasing frequency.  Described as dull steady substernal chest pain without radiation, not associated with nausea, dyspnea, or cough, with no relieving or exacerbating factors, and completely independent of meals and of physical activity.  Pain can occur when supine or when standing. Does not feel like heartburn.  Hx of HpEF. Most recent echo 05/2023 with mild LVH and severe pulmonary HTN with RV dilatation. No known CAD, but does have significant PAD. Hospitalized for CHF exacerbation in 06/2023 followed by rehab at [facility]. Seen for routine office visit within past week, though the chest pain was not discussed at length.  It sounds as though she may have been having this discomfort for a long time (more than a few months), but wasn't severe or frequent and as such wasn't raised as a concern.  She is otherwise doing well. Had a recent fall backwards (lost balance after reaching above her head) against a table; bruised left mid back, improving. No bleeding and didn't strike head.  Current Outpatient Medications:    sacubitril -valsartan  (ENTRESTO ) 97-103 MG, Take 1 tablet by mouth 2 (two) times daily., Disp: 180 tablet, Rfl: 3   albuterol  (VENTOLIN  HFA) 108 (90 Base) MCG/ACT inhaler, Inhale 2 puffs into the lungs every 6 hours as needed for shortness of breath, Disp: 54 g, Rfl: 10   ALPRAZolam  (XANAX ) 0.5 MG tablet, Take 1 tablet (0.5 mg total) by mouth at bedtime. To help with sleep., Disp: 30 tablet, Rfl: 5   buPROPion  (WELLBUTRIN  XL) 150 MG 24 hr tablet, Take 1 tablet (150 mg total) by mouth every morning., Disp: 90 tablet, Rfl: 3   carvedilol  (COREG ) 6.25 MG tablet, Take 1 tablet (6.25 mg total) by mouth 2 (two) times daily with a meal., Disp: 60 tablet, Rfl: 11   Continuous Glucose Sensor (FREESTYLE LIBRE 3 PLUS SENSOR) MISC, Change sensor every 15 days.,  Disp: 6 each, Rfl: 3   diclofenac  Sodium (VOLTAREN ) 1 % GEL, Apply 4 g topically 3 (three) times daily as needed (joint pain)., Disp: 50 g, Rfl: 11   Fluticasone -Umeclidin-Vilant (TRELEGY ELLIPTA ) 100-62.5-25 MCG/ACT AEPB, Inhale 1 puff into the lungs daily., Disp: 180 each, Rfl: 3   furosemide  (LASIX ) 40 MG tablet, Take 1 tablet (40 mg total) by mouth daily., Disp: 30 tablet, Rfl: 11   gabapentin  (NEURONTIN ) 600 MG tablet, Take 1 tablet (600 mg total) by mouth at bedtime., Disp: 90 tablet, Rfl: 3   HYDROcodone -acetaminophen  (NORCO/VICODIN) 5-325 MG tablet, Take 2 tablets by mouth 2 (two) times daily., Disp: 120 tablet, Rfl: 0   insulin  degludec (TRESIBA ) 100 UNIT/ML FlexTouch Pen, Inject 19 Units into the skin every morning., Disp: 3 mL, Rfl: 6   Insulin  Pen Needle 32G X 4 MM MISC, Use to inject insulin  daily, Disp: 100 each, Rfl: 3   ipratropium (ATROVENT ) 0.03 % nasal spray, Place 2 sprays into the nose 3 (three) times daily., Disp: 30 mL, Rfl: 0   ipratropium-albuterol  (DUONEB) 0.5-2.5 (3) MG/3ML SOLN, Inhale 3 mLs by nebulization every 6 (six) hours as needed (shortness of breath, wheezing)., Disp: 90 mL, Rfl: 3   lidocaine  (LIDODERM ) 5 %, Place 1 or 2 patches to painful area of back each day.  Remove & Discard patch within 12 hours or as directed by MD, Disp: 60 patch, Rfl: 2   linaclotide  (LINZESS ) 72 MCG capsule, Take 1 capsule (72 mcg total) by  mouth daily before breakfast., Disp: 90 capsule, Rfl: 3   omeprazole  (PRILOSEC) 40 MG capsule, Take 1 capsule (40 mg total) by mouth 2 (two) times daily., Disp: 60 capsule, Rfl: 5   ondansetron  (ZOFRAN ) 4 MG tablet, Take 1 tablet (4 mg total) by mouth daily as needed for nausea or vomiting., Disp: 30 tablet, Rfl: 1   OXYGEN , Inhale 2 L into the lungs at bedtime., Disp: , Rfl:    potassium chloride  (KLOR-CON  M) 10 MEQ tablet, Take 1 tablet (10 mEq total) by mouth daily., Disp: 30 tablet, Rfl: 11   ramelteon  (ROZEREM ) 8 MG tablet, Take 1 tablet (8 mg  total) by mouth at bedtime., Disp: 30 tablet, Rfl: 3   venlafaxine  XR (EFFEXOR -XR) 150 MG 24 hr capsule, Take 1 capsule (150 mg total) by mouth daily with breakfast., Disp: 90 capsule, Rfl: 3   warfarin (COUMADIN ) 2.5 MG tablet, Current instructions:  Take 1 & 1/2 of your 2.5 mg strength warfarin tablets on Mondays, Wednesdays and Fridays. All OTHER days, take ONLY ONE (1) tablet. Continue to perform your patient self testing, finger-stick, point of care INRs and report results to Pharmacist, Lynwood Lites., Disp: 108 tablet, Rfl: 1  Functional Status: Fully independent in IADLs. Needs assistance with any kind of heavy or exertional task.  Walks with Rollator, short distances.  Doesn't drive.  Lives alone.  Sister is her advocate and accompanies her to many visits.   Objective  BP (!) 153/41 (BP Location: Left Arm, Patient Position: Sitting)   Pulse (!) 48   Temp 97.8 F (36.6 C) (Oral)   Resp (!) 24   Ht 5' 2 (1.575 m)   Wt 136 lb 9.6 oz (62 kg)   SpO2 94% Comment: room air  BMI 24.98 kg/m   Exam: Awake and alert, minimal tremor.  No chest wall tenderness or reproducible tenderness with palpation of anterior chest wall and epigastrium.  No pain elicited with full inspiration.  Lungs with few scattered rhonchi, scant basilar rales, normal breathing effort. No JVD.  Skin turgor generally low.  Color mild pallor, though chronic duskiness lower legs with delayed cap refill is unchanged.  No LE edema.    Assessment and plan:  Ms. Devoto is holding her own.  Experiencing chest pain (see below).  Chronic problems are at the moment under fair control with some opportunities for improvement as follows.  Multiple specialists, appointments to keep track of. Close and regular f/u needed.  Problems addressed today:  Atypical chest pain Assessment & Plan: Months of central substernal dull steady non-radiating chest pain not associated with dyspnea, cough, nausea, palpitations, diaphoresis, and not  c/w prior episodes of heartburn.  EKG non-ischemic NSR- with some PACs.  NTG did not relieve pain. Etiology unclear, though atypical for anginal pain. She will proceed with lung cancer screening CT as previously planned, and attend previously scheduled cardiology appt 09/04/23. Pulmonary HTN could cause CP.   Orders: -     EKG 12-Lead -     Nitroglycerin   Chronic heart failure with preserved ejection fraction (HFpEF) (HCC) Assessment & Plan: Currently HFpEF is compensated.  No change in regimen needed for volume control but will increase Entresto  for BP control.  Has f/u with cardiology 09/04/23.   Orders: -     Sacubitril -Valsartan ; Take 1 tablet by mouth 2 (two) times daily.  Dispense: 180 tablet; Refill: 3  Tobacco use disorder Assessment & Plan: Lung cancer screening is scheduled. She is still smoking.    Pulmonary  hypertension due to chronic obstructive pulmonary disease (HCC) Assessment & Plan: Currently w/o JVD, no LE edema on lasix  40 mg daily.  She has been on as high as 80 mg daily in the past. Monitor. She has both cardiology and pulmonary specialists.   Orders: -     Sacubitril -Valsartan ; Take 1 tablet by mouth 2 (two) times daily.  Dispense: 180 tablet; Refill: 3  Systolic essential hypertension Assessment & Plan: SBP controlled last week though today 153/41 and did take meds this am.  Isolated systolic HTN.  On relatively new Entresto  (replaced ACEI), 49/51 bid, as well as carvedilol  6.25 mg bid and furosemide  40 mg daily.  Uncontrolled HTN is new for her in the past few months. Metoprolol  has been replaced by the carvedilol  during a recent hospitalization. Renal fxn nml last month.  Favor increasing Entresto .   Orders: -     Sacubitril -Valsartan ; Take 1 tablet by mouth 2 (two) times daily.  Dispense: 180 tablet; Refill: 3  Recurrent falls Assessment & Plan: Falls continue, no significant injury (though she is at risk), usually due to losing balance during a task.   Decreased frequency. Recently fell against a table with chest contusion; no fracture suspected.        No follow-ups on file.

## 2023-08-21 NOTE — Assessment & Plan Note (Addendum)
 Months of central substernal dull steady non-radiating chest pain not associated with dyspnea, cough, nausea, palpitations, diaphoresis, and not c/w prior episodes of heartburn.  EKG non-ischemic NSR- with some PACs.  NTG did not relieve pain. Etiology unclear, though atypical for anginal pain. She will proceed with lung cancer screening CT as previously planned, and attend previously scheduled cardiology appt 09/04/23. Pulmonary HTN could cause CP.

## 2023-08-24 ENCOUNTER — Other Ambulatory Visit: Payer: Self-pay | Admitting: Internal Medicine

## 2023-08-24 ENCOUNTER — Telehealth: Payer: Self-pay | Admitting: Pulmonary Disease

## 2023-08-24 ENCOUNTER — Other Ambulatory Visit: Payer: Self-pay

## 2023-08-24 ENCOUNTER — Other Ambulatory Visit (HOSPITAL_COMMUNITY): Payer: Self-pay

## 2023-08-24 DIAGNOSIS — G8929 Other chronic pain: Secondary | ICD-10-CM

## 2023-08-24 MED ORDER — LIDOCAINE 5 % EX PTCH
MEDICATED_PATCH | CUTANEOUS | 2 refills | Status: AC
  Filled 2023-08-24: qty 60, 30d supply, fill #0
  Filled 2023-09-21 – 2023-09-30 (×2): qty 60, fill #0
  Filled 2023-12-16: qty 60, 30d supply, fill #0

## 2023-08-24 NOTE — Telephone Encounter (Signed)
 Hey there would you mind singing this order? Thanks!

## 2023-08-25 ENCOUNTER — Other Ambulatory Visit (HOSPITAL_COMMUNITY): Payer: Self-pay

## 2023-08-25 ENCOUNTER — Other Ambulatory Visit: Payer: Self-pay | Admitting: *Deleted

## 2023-08-25 ENCOUNTER — Encounter: Payer: Self-pay | Admitting: Hematology

## 2023-08-25 ENCOUNTER — Other Ambulatory Visit (HOSPITAL_BASED_OUTPATIENT_CLINIC_OR_DEPARTMENT_OTHER): Payer: Self-pay

## 2023-08-25 ENCOUNTER — Telehealth: Payer: Self-pay

## 2023-08-25 DIAGNOSIS — G4734 Idiopathic sleep related nonobstructive alveolar hypoventilation: Secondary | ICD-10-CM

## 2023-08-25 NOTE — Telephone Encounter (Signed)
 Prior Authorization for patient (Lidocaine  5% patches) came through on cover my meds was submitted with last office notes awaiting approval or denial.  [id number]

## 2023-08-26 NOTE — Telephone Encounter (Signed)
 Your request has been denied We have denied your request as per Medicare Guidelines as this medication is considered a benefit exclusion. This medication is manufactured by a company that did not agree to give a discount to [employer] members in the coverage gap. Medicare

## 2023-08-27 ENCOUNTER — Other Ambulatory Visit (HOSPITAL_COMMUNITY): Payer: Self-pay

## 2023-08-27 ENCOUNTER — Telehealth (HOSPITAL_BASED_OUTPATIENT_CLINIC_OR_DEPARTMENT_OTHER): Payer: Self-pay

## 2023-08-27 NOTE — Telephone Encounter (Signed)
 Copied from CRM [id number]. Topic: Clinical - Order For Equipment >> Aug 27, 2023 11:50 AM Shona S wrote: Reason for CRM: patient is calling for an update on her dme order for supplies, she have not received them yet and want to know the status, I see an order placed but I dont see any new updates regarding it, please call patient

## 2023-08-28 ENCOUNTER — Ambulatory Visit
Admission: RE | Admit: 2023-08-28 | Discharge: 2023-08-28 | Disposition: A | Discharge status: Home / Self Care | Source: Ambulatory Visit | Attending: Internal Medicine | Admitting: Internal Medicine

## 2023-08-28 ENCOUNTER — Other Ambulatory Visit

## 2023-08-28 DIAGNOSIS — Z122 Encounter for screening for malignant neoplasm of respiratory organs: Secondary | ICD-10-CM | POA: Diagnosis not present

## 2023-08-28 DIAGNOSIS — F17210 Nicotine dependence, cigarettes, uncomplicated: Secondary | ICD-10-CM | POA: Diagnosis not present

## 2023-08-28 DIAGNOSIS — F17200 Nicotine dependence, unspecified, uncomplicated: Secondary | ICD-10-CM

## 2023-08-31 NOTE — Telephone Encounter (Signed)
 Pt reports PST FS POC INR results 2.2 (target range 2.0 -2.5) on 1x2.5 mg warfarin PO qd, EXCEPT on Friday, 1&1/2 x 2.5 mg (3.75 mg). No bleeding, no new meds, no missed doses. CONTINUE SAME REGIMEN, repeat PST INR 28JUL25.

## 2023-09-01 ENCOUNTER — Other Ambulatory Visit (HOSPITAL_COMMUNITY): Payer: Self-pay

## 2023-09-01 ENCOUNTER — Other Ambulatory Visit: Payer: Self-pay

## 2023-09-01 ENCOUNTER — Telehealth: Payer: Self-pay

## 2023-09-01 ENCOUNTER — Telehealth: Payer: Self-pay | Admitting: Dietician

## 2023-09-01 ENCOUNTER — Other Ambulatory Visit: Payer: Self-pay | Admitting: Internal Medicine

## 2023-09-01 DIAGNOSIS — E1169 Type 2 diabetes mellitus with other specified complication: Secondary | ICD-10-CM

## 2023-09-01 MED ORDER — INSULIN DEGLUDEC 100 UNIT/ML ~~LOC~~ SOPN
19.0000 [IU] | PEN_INJECTOR | Freq: Every morning | SUBCUTANEOUS | 6 refills | Status: DC
  Filled 2023-09-01 – 2023-09-07 (×2): qty 3, 15d supply, fill #0
  Filled 2023-09-09: qty 6, 30d supply, fill #0
  Filled 2023-10-08: qty 6, 30d supply, fill #1
  Filled 2023-11-25: qty 6, 30d supply, fill #2
  Filled 2024-01-11: qty 3, 15d supply, fill #3

## 2023-09-01 NOTE — Telephone Encounter (Signed)
 [name] calls in asking us  to look at her blood sugars remotely. She lowered insulin  to 22 units and is still having blood sugar below 70 mg/dL. She lowered it to 19 units starting today. I concurred and let her know I would communicate this to Dr. Trudy. She is not symptomatic at all. (We reveiwed signs and symptoms of hypoglycemia) She thinks one time she was laying on it so it may have been a compression low, but the others she knows she was not laying on it.   She agreed to call us  if she still have blood sugar < 80 mg/dL on 19 units.

## 2023-09-01 NOTE — Telephone Encounter (Signed)
 Patient called she is requesting her results from the ct scan. Patient also wanted me to let you know that her appointment with heartcare has been rescheduled. Patient is requesting a call back.

## 2023-09-02 ENCOUNTER — Other Ambulatory Visit (HOSPITAL_COMMUNITY): Payer: Self-pay

## 2023-09-02 ENCOUNTER — Other Ambulatory Visit: Payer: Self-pay

## 2023-09-03 ENCOUNTER — Other Ambulatory Visit: Payer: Self-pay

## 2023-09-03 DIAGNOSIS — G8929 Other chronic pain: Secondary | ICD-10-CM

## 2023-09-03 MED ORDER — HYDROCODONE-ACETAMINOPHEN 5-325 MG PO TABS
2.0000 | ORAL_TABLET | Freq: Two times a day (BID) | ORAL | 0 refills | Status: DC
  Filled 2023-09-03: qty 120, 30d supply, fill #0

## 2023-09-04 ENCOUNTER — Ambulatory Visit: Admitting: Cardiology

## 2023-09-07 ENCOUNTER — Other Ambulatory Visit (HOSPITAL_COMMUNITY): Payer: Self-pay

## 2023-09-07 ENCOUNTER — Telehealth: Payer: Self-pay | Admitting: Pharmacist

## 2023-09-07 ENCOUNTER — Other Ambulatory Visit: Payer: Self-pay

## 2023-09-07 DIAGNOSIS — Z7901 Long term (current) use of anticoagulants: Secondary | ICD-10-CM | POA: Diagnosis not present

## 2023-09-07 DIAGNOSIS — I480 Paroxysmal atrial fibrillation: Secondary | ICD-10-CM | POA: Diagnosis not present

## 2023-09-07 NOTE — Telephone Encounter (Signed)
 Pt reports result of PST FS POC INR = 2.5 (target range 2.0 - 2.5) on 18.75 mg warfarin per week. No bleeding symptoms or signs she states. No new meds, no missed doses. Dose was DECREASED to only one (1) of her 2.5 mg strength warfarin tablets PO every day for one week and then repeat INR and provide results to me.   Lynwood Lites, PharmD, CPP Clinical Pharmacist Practitioner

## 2023-09-09 ENCOUNTER — Other Ambulatory Visit: Payer: Self-pay

## 2023-09-13 ENCOUNTER — Encounter: Payer: Self-pay | Admitting: Internal Medicine

## 2023-09-13 MED ORDER — SACUBITRIL-VALSARTAN 97-103 MG PO TABS
1.0000 | ORAL_TABLET | Freq: Two times a day (BID) | ORAL | 3 refills | Status: AC
  Filled 2023-09-13: qty 180, 90d supply, fill #0
  Filled 2023-12-10: qty 180, 90d supply, fill #1
  Filled 2024-03-10: qty 180, 90d supply, fill #2

## 2023-09-13 NOTE — Assessment & Plan Note (Signed)
 Lung cancer screening is scheduled. She is still smoking.

## 2023-09-13 NOTE — Assessment & Plan Note (Signed)
 Falls continue, no significant injury (though she is at risk), usually due to losing balance during a task.  Decreased frequency. Recently fell against a table with chest contusion; no fracture suspected.

## 2023-09-13 NOTE — Assessment & Plan Note (Signed)
 SBP controlled last week though today 153/41 and did take meds this am.  Isolated systolic HTN.  On relatively new Entresto  (replaced ACEI), 49/51 bid, as well as carvedilol  6.25 mg bid and furosemide  40 mg daily.  Uncontrolled HTN is new for her in the past few months. Metoprolol  has been replaced by the carvedilol  during a recent hospitalization. Renal fxn nml last month.  Favor increasing Entresto .

## 2023-09-13 NOTE — Assessment & Plan Note (Signed)
 Currently w/o JVD, no LE edema on lasix  40 mg daily.  She has been on as high as 80 mg daily in the past. Monitor. She has both cardiology and pulmonary specialists.

## 2023-09-13 NOTE — Assessment & Plan Note (Addendum)
 Currently HFpEF is compensated.  No change in regimen needed for volume control but will increase Entresto  for BP control.  Has f/u with cardiology 09/04/23.

## 2023-09-14 ENCOUNTER — Other Ambulatory Visit (HOSPITAL_COMMUNITY): Payer: Self-pay

## 2023-09-14 ENCOUNTER — Telehealth: Payer: Self-pay | Admitting: Pharmacist

## 2023-09-14 ENCOUNTER — Other Ambulatory Visit: Payer: Self-pay

## 2023-09-14 NOTE — Telephone Encounter (Signed)
 Pt reports INR value from PST FS POC determination, 2.2 (target range 2.0 - 2.5) on 1 of her 2.5 mg strength green warfarin tablets by mouth, once daily. No bleeding, no new medications, no missed doses. Advised to continue same regimen and repeat INR 8/11

## 2023-09-16 DIAGNOSIS — J449 Chronic obstructive pulmonary disease, unspecified: Secondary | ICD-10-CM | POA: Diagnosis not present

## 2023-09-16 DIAGNOSIS — I270 Primary pulmonary hypertension: Secondary | ICD-10-CM | POA: Diagnosis not present

## 2023-09-16 DIAGNOSIS — J439 Emphysema, unspecified: Secondary | ICD-10-CM | POA: Diagnosis not present

## 2023-09-16 DIAGNOSIS — I509 Heart failure, unspecified: Secondary | ICD-10-CM | POA: Diagnosis not present

## 2023-09-17 ENCOUNTER — Other Ambulatory Visit: Payer: Self-pay | Admitting: Student

## 2023-09-17 ENCOUNTER — Encounter: Admitting: Internal Medicine

## 2023-09-17 NOTE — Progress Notes (Signed)
 Received page from on on-call pager about pt complaining of heartburn. States she had a burrito from [employer] about an hour before and then laid down, then subsequently started having a burning sensation that's located in the middle of her chest. She denies any radiation, or worsening of shortness of breath. She does have Pepcid  at home, however it is expired. Recommended she avoid laying down so quickly after eating a meal, and try drinking some cool milk which could help. She is unable to go to the store to pick up Tums at this time, but recommended that when she is able to to have them at her house.  Also encouraged her to take her prilosec.

## 2023-09-19 ENCOUNTER — Other Ambulatory Visit (HOSPITAL_COMMUNITY): Payer: Self-pay

## 2023-09-21 ENCOUNTER — Other Ambulatory Visit (HOSPITAL_COMMUNITY): Payer: Self-pay

## 2023-09-21 ENCOUNTER — Other Ambulatory Visit: Payer: Self-pay

## 2023-09-21 ENCOUNTER — Telehealth: Payer: Self-pay | Admitting: Pharmacist

## 2023-09-21 NOTE — Telephone Encounter (Signed)
 Pt reports result of PST FS POC INR = 2.0 (Target range 2.0 - 3.0) on 17.5 mg warfarin/wk. No bleeding, no symptoms of embolic events, no missed doses. Adequate supply on-hand. Will INCREASE dose FROM 17.5 mg/wk to 18.75mg /wk. Repeat INR 18-AUG-25.   Lynwood Lites, PharmD, CPP Clinical Pharmacist  Practitioner

## 2023-09-23 DIAGNOSIS — G4733 Obstructive sleep apnea (adult) (pediatric): Secondary | ICD-10-CM | POA: Diagnosis not present

## 2023-09-24 ENCOUNTER — Other Ambulatory Visit: Payer: Self-pay | Admitting: Internal Medicine

## 2023-09-24 ENCOUNTER — Ambulatory Visit: Admitting: Internal Medicine

## 2023-09-24 VITALS — BP 129/42 | HR 73 | Temp 97.6°F | Ht 62.0 in | Wt 137.6 lb

## 2023-09-24 DIAGNOSIS — R296 Repeated falls: Secondary | ICD-10-CM

## 2023-09-24 DIAGNOSIS — R5381 Other malaise: Secondary | ICD-10-CM | POA: Diagnosis not present

## 2023-09-24 DIAGNOSIS — Z1231 Encounter for screening mammogram for malignant neoplasm of breast: Secondary | ICD-10-CM

## 2023-09-24 DIAGNOSIS — M797 Fibromyalgia: Secondary | ICD-10-CM

## 2023-09-24 DIAGNOSIS — R52 Pain, unspecified: Secondary | ICD-10-CM

## 2023-09-24 DIAGNOSIS — E118 Type 2 diabetes mellitus with unspecified complications: Secondary | ICD-10-CM | POA: Diagnosis not present

## 2023-09-24 DIAGNOSIS — I5032 Chronic diastolic (congestive) heart failure: Secondary | ICD-10-CM

## 2023-09-24 DIAGNOSIS — Z794 Long term (current) use of insulin: Secondary | ICD-10-CM | POA: Diagnosis not present

## 2023-09-24 DIAGNOSIS — G8929 Other chronic pain: Secondary | ICD-10-CM

## 2023-09-24 DIAGNOSIS — R2681 Unsteadiness on feet: Secondary | ICD-10-CM

## 2023-09-24 DIAGNOSIS — Z8781 Personal history of (healed) traumatic fracture: Secondary | ICD-10-CM

## 2023-09-24 DIAGNOSIS — I10 Essential (primary) hypertension: Secondary | ICD-10-CM

## 2023-09-24 DIAGNOSIS — D500 Iron deficiency anemia secondary to blood loss (chronic): Secondary | ICD-10-CM | POA: Diagnosis not present

## 2023-09-24 LAB — POCT GLYCOSYLATED HEMOGLOBIN (HGB A1C): Hemoglobin A1C: 6.9 % — AB (ref 4.0–5.6)

## 2023-09-24 LAB — GLUCOSE, CAPILLARY: Glucose-Capillary: 158 mg/dL — ABNORMAL HIGH (ref 70–99)

## 2023-09-24 NOTE — Assessment & Plan Note (Addendum)
 A1c today = 6.9 On 19 tresiba  daily.  CGM adjusted to alert at 80. One episode of 29s, was sleeping and awakened, ate. Reaches for comfort foods, candy frequently. Plan to keep Tresiba  at 19 units daily. No GLP1a as further weight loss not desired.

## 2023-09-24 NOTE — Patient Instructions (Signed)
 [name],  Let's keep your tresiba  the way it is and not start Ozempic  at this point.  If anything, I think we'll need to decrease your diabetes regimen.  I have ordered the lift chair but can't determine how to print it.  We'll have to get back to you on this.  I'll hope to see you again in about a month.  Great to hear you have Medicaid!  Dr. Trudy

## 2023-09-24 NOTE — Progress Notes (Signed)
 Error/disregard

## 2023-09-25 LAB — CBC
Hematocrit: 41.9 % (ref 34.0–46.6)
Hemoglobin: 13.6 g/dL (ref 11.1–15.9)
MCH: 30.9 pg (ref 26.6–33.0)
MCHC: 32.5 g/dL (ref 31.5–35.7)
MCV: 95 fL (ref 79–97)
Platelets: 201 x10E3/uL (ref 150–450)
RBC: 4.4 x10E6/uL (ref 3.77–5.28)
RDW: 14.8 % (ref 11.7–15.4)
WBC: 6.8 x10E3/uL (ref 3.4–10.8)

## 2023-09-25 LAB — FERRITIN: Ferritin: 74 ng/mL (ref 15–150)

## 2023-09-28 ENCOUNTER — Other Ambulatory Visit: Payer: Self-pay | Admitting: Internal Medicine

## 2023-09-28 ENCOUNTER — Other Ambulatory Visit (HOSPITAL_COMMUNITY): Payer: Self-pay

## 2023-09-28 ENCOUNTER — Other Ambulatory Visit: Payer: Self-pay

## 2023-09-28 DIAGNOSIS — I10 Essential (primary) hypertension: Secondary | ICD-10-CM

## 2023-09-28 DIAGNOSIS — I5032 Chronic diastolic (congestive) heart failure: Secondary | ICD-10-CM

## 2023-09-28 DIAGNOSIS — I5033 Acute on chronic diastolic (congestive) heart failure: Secondary | ICD-10-CM

## 2023-09-28 DIAGNOSIS — I2723 Pulmonary hypertension due to lung diseases and hypoxia: Secondary | ICD-10-CM

## 2023-09-28 NOTE — Addendum Note (Signed)
 Addended by: ROZELL POWELL HERO on: 09/28/2023 11:09 AM   Modules accepted: Orders

## 2023-09-28 NOTE — Telephone Encounter (Signed)
 Copied from CRM [id number]. Topic: Clinical - Medication Refill >> Sep 28, 2023  9:14 AM Rosaria A wrote: Medication: carvedilol  (COREG ) 6.25 MG tablet sacubitril -valsartan  (ENTRESTO ) 97-103 MG  Has the patient contacted their pharmacy? No   This is the patient's preferred pharmacy:  [facility] [address] [place] [address] Phone: [phone] Fax: [phone]  Is this the correct pharmacy for this prescription? Yes   Has the prescription been filled recently? Yes  Is the patient out of the medication? Yes Patient is out of carvedilol  (COREG ) 6.25 MG tablet. Patient also states that the dosage on her Entresto  medication changed and no one told her it was going to change and she is wanting to know why the dosage changed. Per patient is use to be 49-51 mg and now it has changed to 97-103 mg. Patient is wanting a call back to discuss.    Has the patient been seen for an appointment in the last year OR does the patient have an upcoming appointment? Yes  Can we respond through MyChart? No  Agent: Please be advised that Rx refills may take up to 3 business days. We ask that you follow-up with your pharmacy.

## 2023-09-28 NOTE — Telephone Encounter (Signed)
 Pt provides result of PST FS POC INR = 2.1 (target range 2.0 - 2.5 per Dr. Trudy) on 18.75 mg warfarin/wk. Advised to continue same regimen. Repeat INR 25-AUG-25. No missed doses, no new medications. No bleeding events. No symptoms suggestive of embolic events.

## 2023-09-29 ENCOUNTER — Other Ambulatory Visit (HOSPITAL_COMMUNITY): Payer: Self-pay

## 2023-09-29 ENCOUNTER — Other Ambulatory Visit: Payer: Self-pay

## 2023-09-29 DIAGNOSIS — G8929 Other chronic pain: Secondary | ICD-10-CM

## 2023-09-29 NOTE — Addendum Note (Signed)
 Addended by: Atarah Cadogan F on: 09/29/2023 08:50 AM   Modules accepted: Orders

## 2023-09-30 ENCOUNTER — Other Ambulatory Visit (HOSPITAL_COMMUNITY): Payer: Self-pay

## 2023-09-30 ENCOUNTER — Telehealth: Payer: Self-pay | Admitting: *Deleted

## 2023-09-30 MED ORDER — HYDROCODONE-ACETAMINOPHEN 5-325 MG PO TABS
2.0000 | ORAL_TABLET | Freq: Two times a day (BID) | ORAL | 0 refills | Status: DC
Start: 2023-10-02 — End: 2023-10-29
  Filled 2023-10-02: qty 120, 30d supply, fill #0

## 2023-09-30 NOTE — Telephone Encounter (Signed)
 Return call to pt to let her know per office's note on 7/11  will increase Entresto  for BP control. Pt stated she does not remember the doctor telling her this. She just received the increased dose ( 97- 103) from the pharmacy, so she has not started it yet but stated she will when she refill her pill box. LOV - 8/14 BP was 129/42.

## 2023-09-30 NOTE — Telephone Encounter (Signed)
 Copied from CRM #[id number]. Topic: Clinical - Medication Question >> Sep 30, 2023  9:28 AM Carrielelia G wrote: *Patient would like a call before 3pm because she takes naps.  Patient has a question regarding the medication rx:  sacubitril -valsartan  (ENTRESTO ) 97-103 MG  She would like to know why it has went form 49-51MG  to 97-103MG   Please advise

## 2023-10-01 ENCOUNTER — Other Ambulatory Visit: Payer: Self-pay

## 2023-10-01 ENCOUNTER — Other Ambulatory Visit (HOSPITAL_COMMUNITY): Payer: Self-pay

## 2023-10-02 ENCOUNTER — Other Ambulatory Visit: Payer: Self-pay

## 2023-10-02 ENCOUNTER — Other Ambulatory Visit (HOSPITAL_COMMUNITY): Payer: Self-pay

## 2023-10-02 ENCOUNTER — Encounter: Payer: Self-pay | Admitting: Podiatry

## 2023-10-02 ENCOUNTER — Ambulatory Visit: Payer: Self-pay | Admitting: Internal Medicine

## 2023-10-02 ENCOUNTER — Ambulatory Visit (INDEPENDENT_AMBULATORY_CARE_PROVIDER_SITE_OTHER): Admitting: Podiatry

## 2023-10-02 DIAGNOSIS — M79675 Pain in left toe(s): Secondary | ICD-10-CM | POA: Diagnosis not present

## 2023-10-02 DIAGNOSIS — L84 Corns and callosities: Secondary | ICD-10-CM

## 2023-10-02 DIAGNOSIS — B351 Tinea unguium: Secondary | ICD-10-CM

## 2023-10-02 DIAGNOSIS — M79674 Pain in right toe(s): Secondary | ICD-10-CM

## 2023-10-02 DIAGNOSIS — E1142 Type 2 diabetes mellitus with diabetic polyneuropathy: Secondary | ICD-10-CM

## 2023-10-02 NOTE — Progress Notes (Signed)
    Subjective:  Patient ID: [name], female    DOB: [birth year],  MRN: [id number]  [name] presents to clinic today for:  Chief Complaint  Patient presents with   [facility]    [facility] and nail trim. A1c 6.9.  callus on R first meta  Coumadin   . Patient notes nails are thick and elongated, causing pain in shoe gear when ambulating.  She also has painful calluses on the ball of her right foot subfirst metatarsal head. She does take gabapentin  for neuropathy  PCP is Trudy Mliss Dragon, MD.  Allergies  Allergen Reactions   Dexilant [Dexlansoprazole] Other (See Comments)    Made acid reflux worse   Metoclopramide      Other Reaction(s): unknown   Isovue  [Iopamidol ] Itching and Rash   Lorazepam Other (See Comments)    Patient's sister noted that ativan caused the patient to become extremely confused during hospitalization 09/2010; tolerates Xanax    Oxycontin  [Oxycodone ] Other (See Comments)    headache   Tramadol Hcl Swelling    Ankle swelling    Review of Systems: Negative except as noted in the HPI.  Objective:  There were no vitals filed for this visit.  [name] is a pleasant 73 y.o. female in NAD. AAO x 3.  Vascular Examination: Patient has palpable DP pulse, absent PT pulse bilateral.  Delayed capillary refill bilateral toes.  Sparse digital hair bilateral.  Proximal to distal cooling WNL bilateral.    Dermatological Examination: Interspaces are clear with no open lesions noted bilateral.  Nails are 3-78mm thick, with yellowish/brown discoloration, subungual debris and distal onycholysis x10.  There is pain with compression of nails x10.  There there is hyperkeratotic lesion noted submet 1 right foot.  No significant callus subfirst metatarsal head left foot today.   Neurological Examination: Protective sensation intact b/l LE. Vibratory sensation diminished b/l LE.  Light touch sensation diminished bilateral  Musculoskeletal  Examination: Muscle strength 5/5 to all LE muscle groups b/l.      Latest Ref Rng & Units 09/24/2023    9:47 AM 06/20/2023    2:45 AM 03/12/2023    9:05 AM 12/02/2022   10:22 AM  Hemoglobin A1C  Hemoglobin-A1c 4.0 - 5.6 % 6.9  5.9  6.1  7.7    Patient qualifies for at-risk foot care because of diabetes with PVD.  Assessment/Plan: 1. Pain due to onychomycosis of toenails of both feet   2. Diabetic polyneuropathy associated with type 2 diabetes mellitus (HCC)   3. Callus of foot     No orders of the defined types were placed in this encounter.  Mycotic nails x10 were sharply debrided with sterile nail nippers and power debriding burr to decrease bulk and length.  Hyperkeratotic lesions x 1 right foot subfirst metatarsal head were shaved with #312 blade.  Chronic anticoagulation on Coumadin .  Continue daily foot checks and tight glucose control.  Follow-up in 3 months for diabetic footcare   [name], DPM, AACFAS [facility]     [address] [place], [place] [address]                Office [phone]  Fax [phone]

## 2023-10-05 DIAGNOSIS — I480 Paroxysmal atrial fibrillation: Secondary | ICD-10-CM | POA: Diagnosis not present

## 2023-10-05 DIAGNOSIS — Z7901 Long term (current) use of anticoagulants: Secondary | ICD-10-CM | POA: Diagnosis not present

## 2023-10-05 NOTE — Telephone Encounter (Signed)
 Pt provided results of PST FS POC INR = 1.9 (target range 2.0 - 2.5 per Dr. Trudy) on 18.75 mg warfarin/wk. INCREASED to 20 mg warfarin/wk as: Su-2.5mg M-3.75mg T-2.5mg W-2.5mg Th-3.75mg F-2.5mg Sa-2.5mg . Repeat INR 1-SEP-25. Denies any bleeding, no new medications, no missed doses she states. Follow up INR as above. Adequate supply of warfarin on hand she states.   Lynwood Lites, PharmD, CPP Clinical Pharmacist Practitioner

## 2023-10-07 ENCOUNTER — Other Ambulatory Visit (HOSPITAL_COMMUNITY): Payer: Self-pay

## 2023-10-07 ENCOUNTER — Other Ambulatory Visit: Payer: Self-pay | Admitting: Internal Medicine

## 2023-10-07 DIAGNOSIS — G4700 Insomnia, unspecified: Secondary | ICD-10-CM

## 2023-10-07 MED ORDER — RAMELTEON 8 MG PO TABS
8.0000 mg | ORAL_TABLET | Freq: Every day | ORAL | 3 refills | Status: DC
  Filled 2023-10-07: qty 30, 30d supply, fill #0
  Filled 2023-11-25: qty 30, 30d supply, fill #1
  Filled 2024-01-04: qty 30, 30d supply, fill #2
  Filled 2024-02-02: qty 30, 30d supply, fill #3

## 2023-10-08 ENCOUNTER — Other Ambulatory Visit (HOSPITAL_COMMUNITY): Payer: Self-pay

## 2023-10-12 DIAGNOSIS — I272 Pulmonary hypertension, unspecified: Secondary | ICD-10-CM | POA: Diagnosis not present

## 2023-10-12 DIAGNOSIS — S72142D Displaced intertrochanteric fracture of left femur, subsequent encounter for closed fracture with routine healing: Secondary | ICD-10-CM | POA: Diagnosis not present

## 2023-10-12 DIAGNOSIS — I509 Heart failure, unspecified: Secondary | ICD-10-CM | POA: Diagnosis not present

## 2023-10-12 DIAGNOSIS — G4734 Idiopathic sleep related nonobstructive alveolar hypoventilation: Secondary | ICD-10-CM | POA: Diagnosis not present

## 2023-10-12 DIAGNOSIS — G4736 Sleep related hypoventilation in conditions classified elsewhere: Secondary | ICD-10-CM | POA: Diagnosis not present

## 2023-10-12 DIAGNOSIS — J9601 Acute respiratory failure with hypoxia: Secondary | ICD-10-CM | POA: Diagnosis not present

## 2023-10-12 DIAGNOSIS — I270 Primary pulmonary hypertension: Secondary | ICD-10-CM | POA: Diagnosis not present

## 2023-10-12 DIAGNOSIS — J449 Chronic obstructive pulmonary disease, unspecified: Secondary | ICD-10-CM | POA: Diagnosis not present

## 2023-10-12 DIAGNOSIS — J439 Emphysema, unspecified: Secondary | ICD-10-CM | POA: Diagnosis not present

## 2023-10-12 DIAGNOSIS — G9340 Encephalopathy, unspecified: Secondary | ICD-10-CM | POA: Diagnosis not present

## 2023-10-12 DIAGNOSIS — R269 Unspecified abnormalities of gait and mobility: Secondary | ICD-10-CM | POA: Diagnosis not present

## 2023-10-13 ENCOUNTER — Other Ambulatory Visit: Payer: Self-pay

## 2023-10-13 ENCOUNTER — Other Ambulatory Visit (HOSPITAL_COMMUNITY): Payer: Self-pay

## 2023-10-14 ENCOUNTER — Ambulatory Visit
Admission: RE | Admit: 2023-10-14 | Discharge: 2023-10-14 | Disposition: A | Discharge status: Home / Self Care | Source: Ambulatory Visit | Attending: Internal Medicine | Admitting: Internal Medicine

## 2023-10-14 ENCOUNTER — Other Ambulatory Visit: Payer: Self-pay

## 2023-10-14 ENCOUNTER — Ambulatory Visit

## 2023-10-14 ENCOUNTER — Encounter: Payer: Self-pay | Admitting: Hematology

## 2023-10-14 DIAGNOSIS — Z1231 Encounter for screening mammogram for malignant neoplasm of breast: Secondary | ICD-10-CM

## 2023-10-15 ENCOUNTER — Other Ambulatory Visit: Payer: Self-pay

## 2023-10-15 ENCOUNTER — Other Ambulatory Visit (HOSPITAL_COMMUNITY): Payer: Self-pay

## 2023-10-15 ENCOUNTER — Ambulatory Visit: Admitting: Physician Assistant

## 2023-10-16 ENCOUNTER — Other Ambulatory Visit (HOSPITAL_COMMUNITY): Payer: Self-pay

## 2023-10-17 DIAGNOSIS — I270 Primary pulmonary hypertension: Secondary | ICD-10-CM | POA: Diagnosis not present

## 2023-10-17 DIAGNOSIS — I509 Heart failure, unspecified: Secondary | ICD-10-CM | POA: Diagnosis not present

## 2023-10-17 DIAGNOSIS — J449 Chronic obstructive pulmonary disease, unspecified: Secondary | ICD-10-CM | POA: Diagnosis not present

## 2023-10-17 DIAGNOSIS — J439 Emphysema, unspecified: Secondary | ICD-10-CM | POA: Diagnosis not present

## 2023-10-18 IMAGING — CT CT CHEST LUNG CANCER SCREENING LOW DOSE W/O CM
2 of 5 series · 15 of 40 positions shown, 18 images · non-contrast
Comparison: 05/22/2020

CLINICAL DATA: Sixty-one pack-year smoking history/current smoker

EXAM:
CT CHEST WITHOUT CONTRAST LOW-DOSE FOR LUNG CANCER SCREENING
TECHNIQUE: Multidetector CT imaging of the chest was performed following the
standard protocol without IV contrast.
RADIATION DOSE REDUCTION: This exam was performed according to the
departmental dose-optimization program which includes automated
exposure control, adjustment of the mA and/or kV according to
patient size and/or use of iterative reconstruction technique.

[Series 4: lung 1.00 br44 cor · coronal · 0.56mm/px · 3 of 364 slices shown]
[im 73/364  lung]
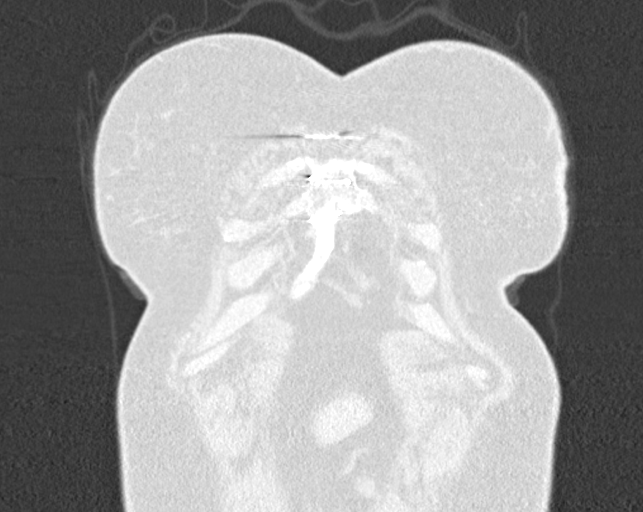
[im 146/364  lung]
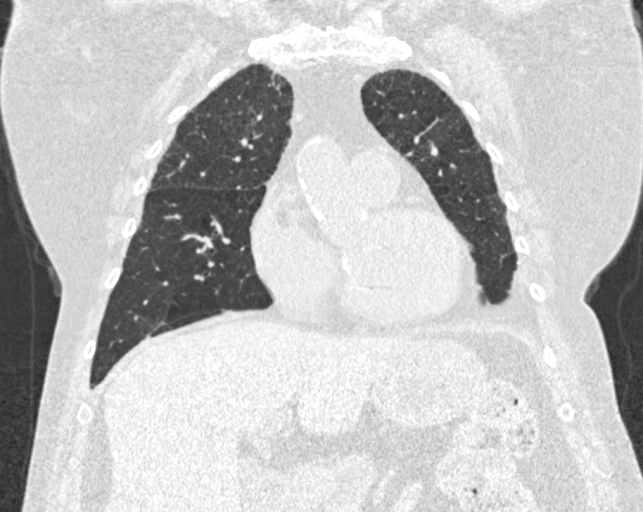
[im 218/364  lung]
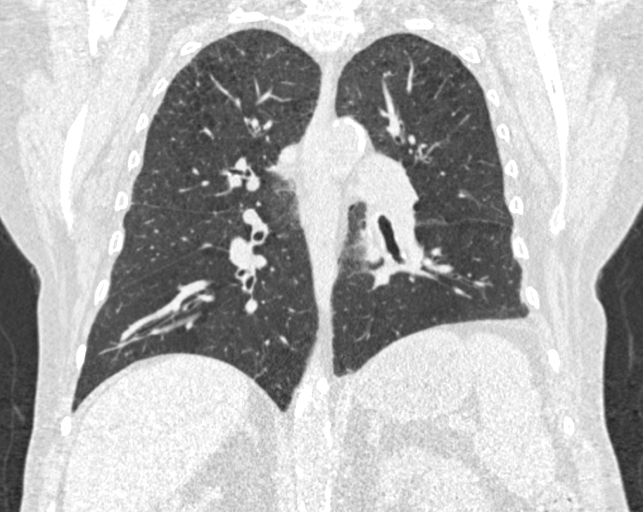

[Series 9: lung 1.00 br60 axial · axial · 0.71mm/px · z∈[-1181,-921]mm · 12 of 288 slices shown, 15 images]
[im 14/288  mediastinal]
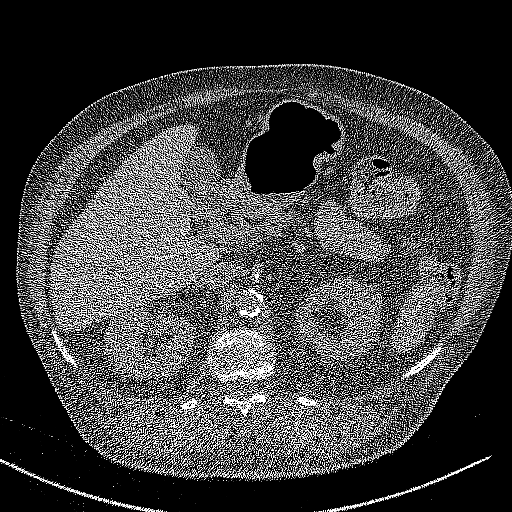
[im 14/288  lung]
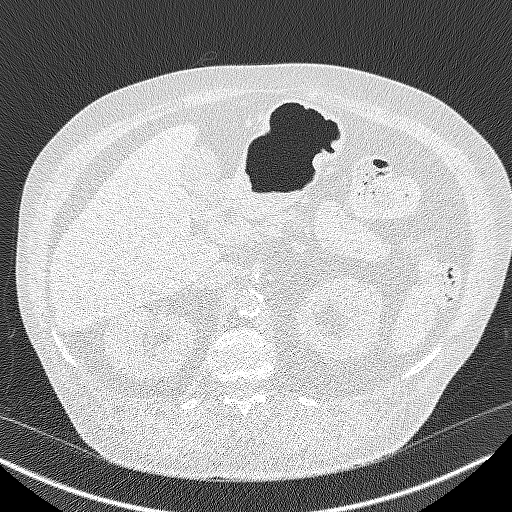
[im 40/288  lung]
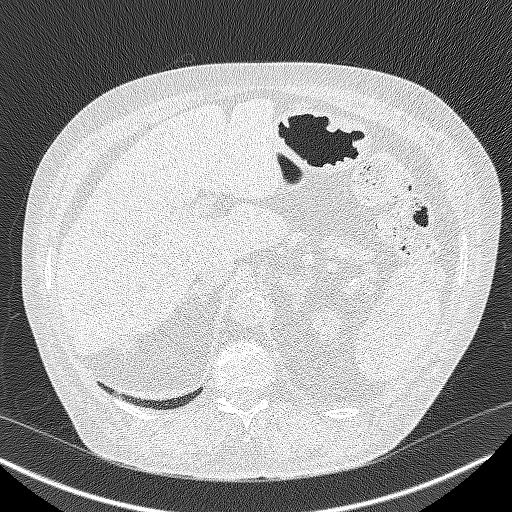
[im 66/288  lung]
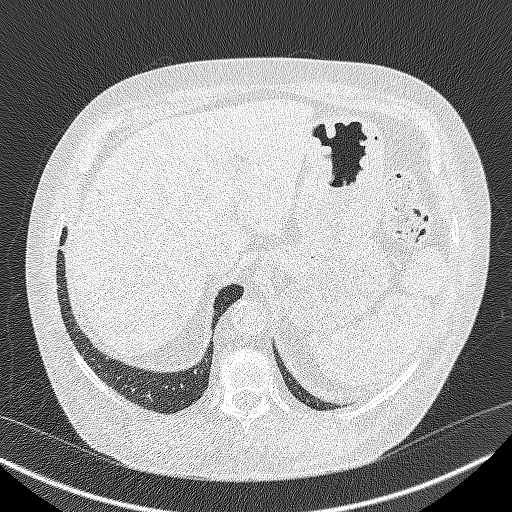
[im 92/288  lung]
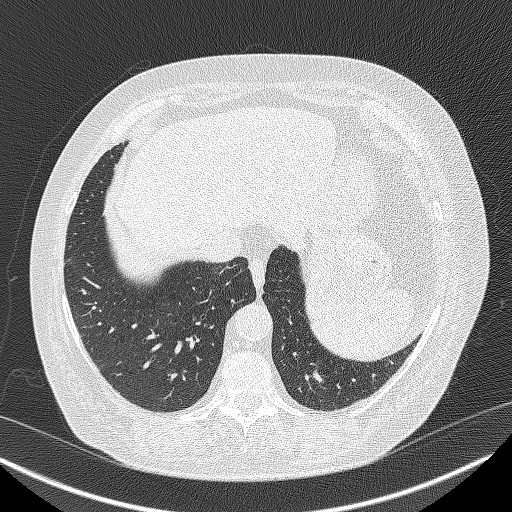
[im 105/288  mediastinal]
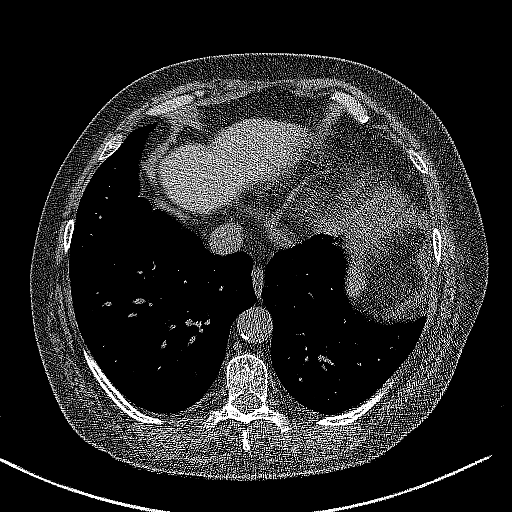
[im 105/288  lung]
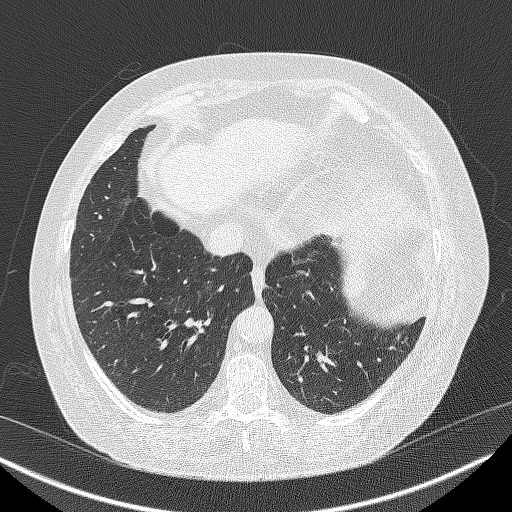
[im 131/288  lung]
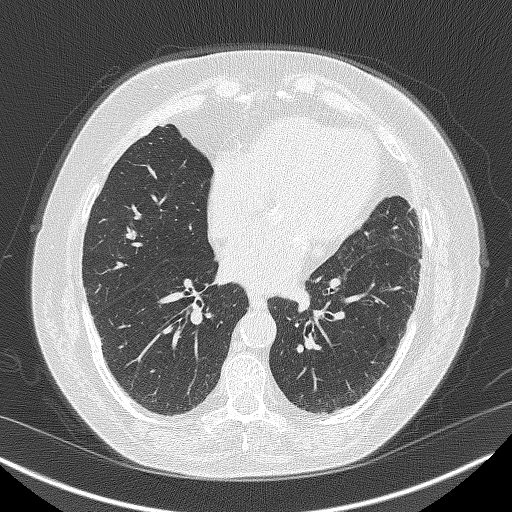
[im 157/288  lung]
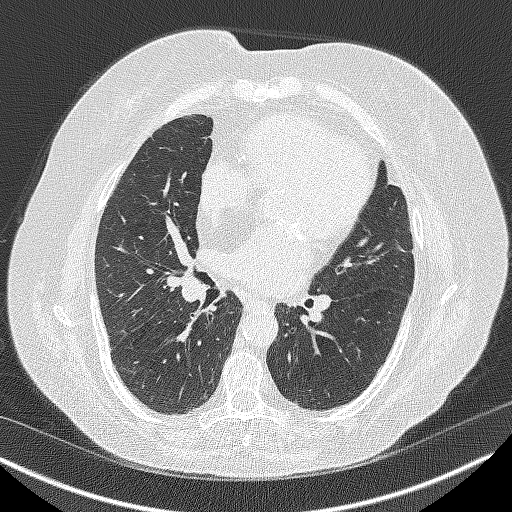
[im 183/288  lung]
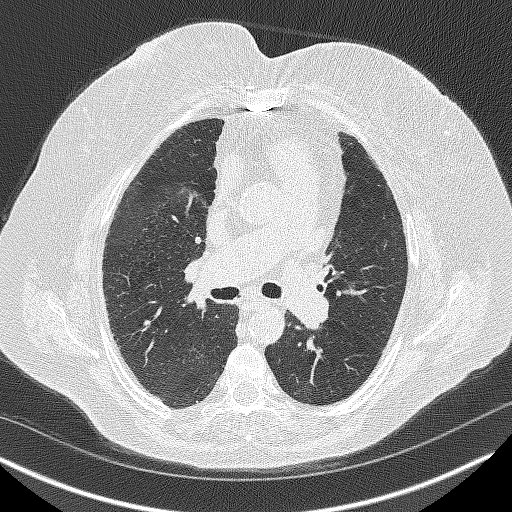
[im 196/288  mediastinal]
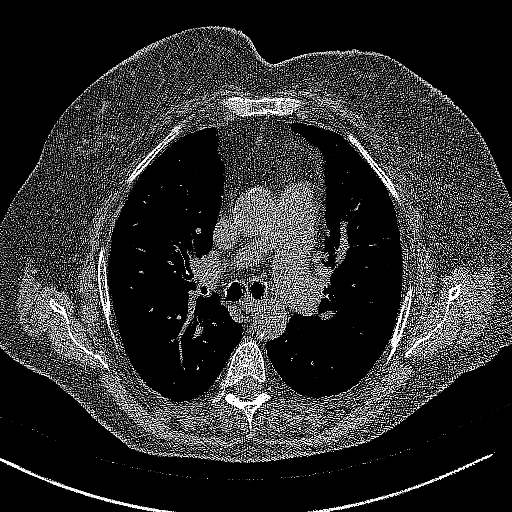
[im 196/288  lung]
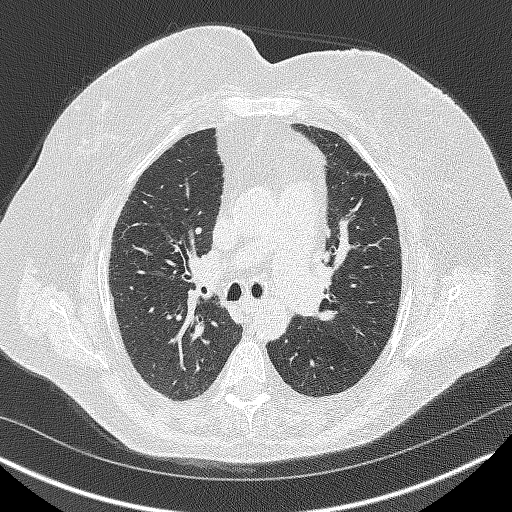
[im 222/288  lung]
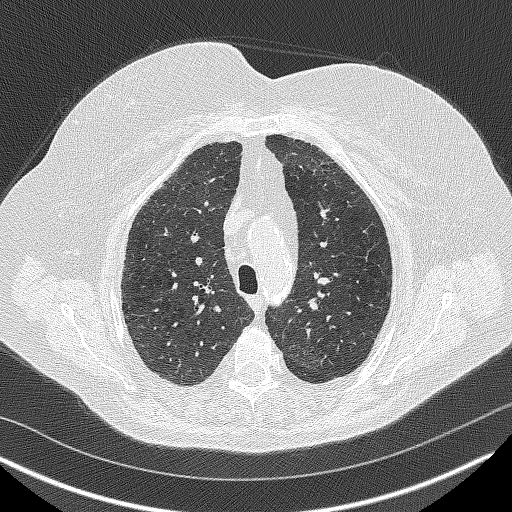
[im 248/288  lung]
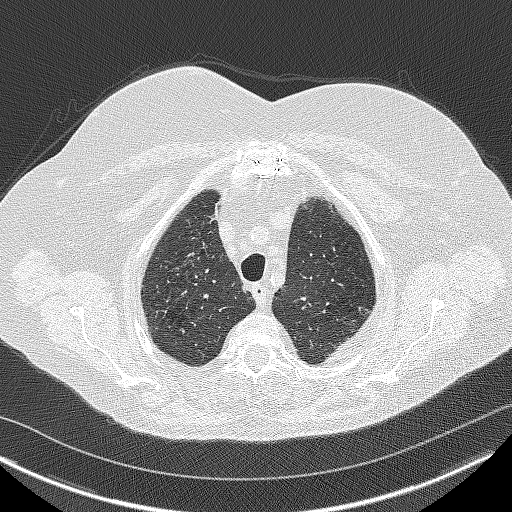
[im 274/288  lung]
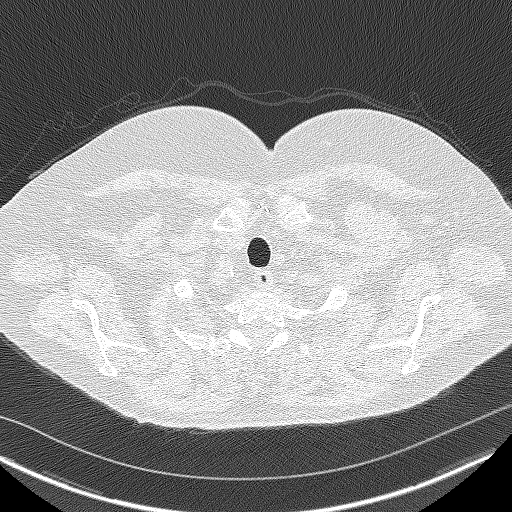

[15 of 40 positions shown; findings below may reference images not displayed]

FINDINGS: Cardiovascular: Aortic atherosclerosis. Mild cardiomegaly with
lipomatous hypertrophy of the interatrial septum. Left main and
right coronary artery calcification.

Mediastinum/Nodes: Prominent mediastinal nodes are similar and
maintain their fatty hila, suggesting reactive etiology. Hilar
regions poorly evaluated without intravenous contrast.

Lungs/Pleura: No pleural fluid.  Moderate centrilobular emphysema.

Upper Abdomen: Nonspecific caudate lobe enlargement. Normal imaged
portions of the spleen, stomach, pancreas, right adrenal gland. Mild
left adrenal thickening. Normal imaged left kidney.

An exophytic upper pole right renal 2.4 cm low-density lesion is
similar in size to on the prior exam, favoring a cyst or minimally
complex cyst.

Subtle hyperattenuation again identified within the dependent
gallbladder.

Musculoskeletal: Prior median sternotomy. Right clavicular fixation.
Osteopenia. Remote right rib fractures.
IMPRESSION: 1. Lung-RADS 2, benign appearance or behavior. Continue annual
screening with low-dose chest CT without contrast in 12 months.
2. Cholelithiasis.
3. Aortic Atherosclerosis (WNIP4-MWH.H) and Emphysema (WNIP4-ZHM.R).
Coronary artery atherosclerosis.

## 2023-10-20 ENCOUNTER — Other Ambulatory Visit (HOSPITAL_COMMUNITY): Payer: Self-pay

## 2023-10-22 ENCOUNTER — Encounter: Admitting: Internal Medicine

## 2023-10-22 ENCOUNTER — Other Ambulatory Visit (HOSPITAL_COMMUNITY): Payer: Self-pay

## 2023-10-26 ENCOUNTER — Other Ambulatory Visit (HOSPITAL_COMMUNITY): Payer: Self-pay

## 2023-10-26 ENCOUNTER — Telehealth: Payer: Self-pay | Admitting: Pharmacist

## 2023-10-26 NOTE — Telephone Encounter (Signed)
 Pt reports INR result after PST FS POC = 2.5 (Target range 2.0 - 2.5) on 20mg  warfarin/wk. No bleeding. Reduced to 18.75 mg warfarin/wk. Repeat INR 22-SEP-25.

## 2023-10-28 NOTE — Assessment & Plan Note (Signed)
 CHF compensated. Increase Entresto  for co-indication with HTN, as tolerated by BP and renal fxn.  Recheck 1 month. Continues on post-hospitalization regimen including carvedilol  (replaces metoprolol ) and furosemide  40 mg +Kcl 10 mEq daily.

## 2023-10-28 NOTE — Assessment & Plan Note (Signed)
 Increase Entresto  for co-indication with CHF, as tolerated by BP and renal fxn.  Recheck 1 month. Co ntinues on post-hospitalization regimen including carvedilol  (replaces metoprolol ) and furosemide  40 mg +Kcl 10 mEq daily.

## 2023-10-28 NOTE — Progress Notes (Signed)
 73 y.o. [name] is here for close interval follow-up of pulmonary HTN, HTN, pEFCHF, DM2 with complications, PAD, chronic pain, chronic Fe-deficiency anemia of unknown cause followed by hematologist, among other conditions.  She was last seen about a month ago.  Since last visit patient has seen her podiatrist for routine foot care.   Doing ok, but doesn't feel well generally.  Low activity, exhaustion, chronic pain are limiting.  Snacks on unhealthy foods which fluctuate her blood sugar; she has had some lows and alarm is reset today.  She doesn't follow a particular schedule of meals/snacks. Continues to have atypical CP which is suspected to be related to pulmonary HTN.  Has qualified for Medicaid!  Patient Active Problem List   Diagnosis Date Noted   Atypical chest pain 08/21/2023   S/P MVR (mitral valve replacement) 06/23/2023   Malnutrition of moderate degree 06/19/2023   Iron  deficiency anemia 05/19/2023   IDA (iron  deficiency anemia) 05/19/2023   Chronic anticoagulation 10/18/2022   History of fracture of left hip 10/17/2022   Generalized pain 08/01/2022   Multifactorial functional impairment 02/13/2022   History of stroke without residual deficits 01/17/2022   Diabetic peripheral neuropathy associated with type 2 diabetes mellitus (HCC) 11/11/2021   Recurrent falls 11/11/2021   Gait instability 09/19/2021   Perennial non-allergic rhinitis 09/19/2021   Chronic, continuous use of opioids for chronic pain 07/10/2021   Polypharmacy 11/08/2020   Personal history of renal cell carcinoma 09/12/2020   Tinea pedis of both feet 08/07/2020   Essential tremor 11/17/2019   Nocturnal hypoxia per sleep study 07/2019    Carotid artery stenosis; s/p R endarterectomy    COPD mixed type (HCC)    Arthritis of both hands 07/01/2019   Stage 2 chronic kidney disease due to type 2 diabetes mellitus (HCC) 12/16/2018   Lichen sclerosus of female genitalia 01/12/2017   Mixed stress  and urge urinary incontinence 05/27/2016   Pulmonary hypertension due to chronic obstructive pulmonary disease (HCC) 04/25/2016   Asymptomatic cholelithiasis 09/25/2015   Chronic iron  deficiency anemia 12/26/2014   Aortic atherosclerosis (HCC) 10/19/2014   Right nephrolithiasis, asymptomatic (incidental finding) 09/06/2014   Headache, chronic intermittent 01/14/2013   Chronic low back pain 10/06/2012   Long term current use of anticoagulant therapy 09/30/2012   Recurrent candidal intertrigo 07/28/2012   Tobacco use disorder 07/28/2012   History of colonic polyps 05/14/2011   Sleep disturbance 05/14/2011   Abdominal wall hernia 05/14/2011   Constipation due to opioid therapy 02/03/2011   Health care maintenance 11/04/2010   PAF (paroxysmal atrial fibrillation) (HCC) 10/22/2010   Chronic heart failure with preserved ejection fraction (HFpEF) (HCC) 10/21/2010   Fibromyalgia 08/29/2010   Gastroesophageal reflux disease    Type 2 diabetes mellitus with complication, with long-term current use of insulin  (HCC)    Osteoporosis with current pathological fracture with routine healing    Peripheral arterial disease (HCC) 05/27/2010   History of mitral valve replacement with bioprosthetic valve 2012   Systolic essential hypertension 06/19/2008   Hyperlipidemia 11/20/2005   Severe major depression (HCC) 11/19/2005    Current Outpatient Medications:    albuterol  (VENTOLIN  HFA) 108 (90 Base) MCG/ACT inhaler, Inhale 2 puffs into the lungs every 6 hours as needed for shortness of breath, Disp: 54 g, Rfl: 10   ALPRAZolam  (XANAX ) 0.5 MG tablet, Take 1 tablet (0.5 mg total) by mouth at bedtime. To help with sleep., Disp: 30 tablet, Rfl: 5   buPROPion  (WELLBUTRIN  XL) 150 MG 24 hr  tablet, Take 1 tablet (150 mg total) by mouth every morning., Disp: 90 tablet, Rfl: 3   carvedilol  (COREG ) 6.25 MG tablet, Take 1 tablet (6.25 mg total) by mouth 2 (two) times daily with a meal., Disp: 60 tablet, Rfl: 11    Continuous Glucose Sensor (FREESTYLE LIBRE 3 PLUS SENSOR) MISC, Change sensor every 15 days., Disp: 6 each, Rfl: 3   diclofenac  Sodium (VOLTAREN ) 1 % GEL, Apply 4 g topically 3 (three) times daily as needed (joint pain)., Disp: 50 g, Rfl: 11   Fluticasone -Umeclidin-Vilant (TRELEGY ELLIPTA ) 100-62.5-25 MCG/ACT AEPB, Inhale 1 puff into the lungs daily., Disp: 180 each, Rfl: 3   furosemide  (LASIX ) 40 MG tablet, Take 1 tablet (40 mg total) by mouth daily., Disp: 30 tablet, Rfl: 11   gabapentin  (NEURONTIN ) 600 MG tablet, Take 1 tablet (600 mg total) by mouth at bedtime., Disp: 90 tablet, Rfl: 3   HYDROcodone -acetaminophen  (NORCO/VICODIN) 5-325 MG tablet, Take 2 tablets by mouth 2 (two) times daily., Disp: 120 tablet, Rfl: 0   insulin  degludec (TRESIBA ) 100 UNIT/ML FlexTouch Pen, Inject 19 Units into the skin every morning., Disp: 3 mL, Rfl: 6   Insulin  Pen Needle 32G X 4 MM MISC, Use to inject insulin  daily, Disp: 100 each, Rfl: 3   ipratropium (ATROVENT ) 0.03 % nasal spray, Place 2 sprays into the nose 3 (three) times daily., Disp: 30 mL, Rfl: 0   ipratropium-albuterol  (DUONEB) 0.5-2.5 (3) MG/3ML SOLN, Inhale 3 mLs by nebulization every 6 (six) hours as needed (shortness of breath, wheezing)., Disp: 90 mL, Rfl: 3   lidocaine  (LIDODERM ) 5 %, Place 1 or 2 patches to painful area of back each day.  Remove & Discard patch within 12 hours or as directed by MD, Disp: 60 patch, Rfl: 2   linaclotide  (LINZESS ) 72 MCG capsule, Take 1 capsule (72 mcg total) by mouth daily before breakfast., Disp: 90 capsule, Rfl: 3   omeprazole  (PRILOSEC) 40 MG capsule, Take 1 capsule (40 mg total) by mouth 2 (two) times daily., Disp: 60 capsule, Rfl: 5   ondansetron  (ZOFRAN ) 4 MG tablet, Take 1 tablet (4 mg total) by mouth daily as needed for nausea or vomiting., Disp: 30 tablet, Rfl: 1   OXYGEN , Inhale 2 L into the lungs at bedtime., Disp: , Rfl:    potassium chloride  (KLOR-CON  M) 10 MEQ tablet, Take 1 tablet (10 mEq total) by  mouth daily., Disp: 30 tablet, Rfl: 11   ramelteon  (ROZEREM ) 8 MG tablet, Take 1 tablet (8 mg total) by mouth at bedtime., Disp: 30 tablet, Rfl: 3   sacubitril -valsartan  (ENTRESTO ) 97-103 MG, Take 1 tablet by mouth 2 (two) times daily., Disp: 180 tablet, Rfl: 3   venlafaxine  XR (EFFEXOR -XR) 150 MG 24 hr capsule, Take 1 capsule (150 mg total) by mouth daily with breakfast., Disp: 90 capsule, Rfl: 3   warfarin (COUMADIN ) 2.5 MG tablet, Current instructions:  Take 1 & 1/2 of your 2.5 mg strength warfarin tablets on Mondays, Wednesdays and Fridays. All OTHER days, take ONLY ONE (1) tablet. Continue to perform your patient self testing, finger-stick, point of care INRs and report results to Pharmacist, Lynwood Lites., Disp: 108 tablet, Rfl: 1  Functional Status: Fully independent in IADLs. Needs assistance with any kind of heavy or exertional task.  Very low activity level.  Frequent falls.  Walks with Rollator, short distances.  Doesn't drive.  Lives alone.  Sister is her advocate and accompanies her to many visits, as she does today.   Objective BP (!) 129/42 (  BP Location: Left Arm, Patient Position: Sitting, Cuff Size: Small)   Pulse 73   Temp 97.6 F (36.4 C) (Oral)   Ht 5' 2 (1.575 m)   Wt 137 lb 9.6 oz (62.4 kg)   SpO2 94%   BMI 25.17 kg/m   Exam: wake and alert, minimal tremor.  No chest wall tenderness or reproducible tenderness with palpation of anterior chest wall and epigastrium.  No pain elicited with full inspiration.  Lungs with few scattered rhonchi, scant basilar rales, normal breathing effort. No JVD.  Skin turgor generally low.  Color mild pallor, though chronic duskiness lower legs with delayed cap refill is unchanged.  No LE edema.  Feet are cool chronically.  Assessment and Plan: Type 2 diabetes mellitus with complication, with long-term current use of insulin  (HCC) Assessment & Plan: A1c today = 6.9 On 19 tresiba  daily.  CGM adjusted to alert at 80. One episode of 74s, was  sleeping and awakened, ate. Reaches for comfort foods, candy frequently. Plan to keep Tresiba  at 19 units daily. No GLP1a as further weight loss not desired.   Orders: -     POCT glycosylated hemoglobin (Hb A1C)  Iron  deficiency anemia due to chronic blood loss -     CBC -     Ferritin  History of fracture of left hip  Generalized pain  Multifactorial functional impairment  Gait instability  Recurrent falls  Systolic essential hypertension Assessment & Plan: Increase Entresto  for co-indication with CHF, as tolerated by BP and renal fxn.  Recheck 1 month. Co ntinues on post-hospitalization regimen including carvedilol  (replaces metoprolol ) and furosemide  40 mg +Kcl 10 mEq daily.    Chronic heart failure with preserved ejection fraction (HFpEF) (HCC) Assessment & Plan: CHF compensated. Increase Entresto  for co-indication with HTN, as tolerated by BP and renal fxn.  Recheck 1 month. Continues on post-hospitalization regimen including carvedilol  (replaces metoprolol ) and furosemide  40 mg +Kcl 10 mEq daily.   Other orders -     Glucose, capillary    Return in about 4 weeks (around 10/22/2023) for chronic condition monitoring, symptom re-check, functional reassessment.

## 2023-10-28 NOTE — Progress Notes (Unsigned)
 73 y.o. [name] is here for routine follow-up of HTN, pulmonary HTN with chest pain, DM2 with complications on insulin , PAD, chronic pain, chronic Fe-deficiency anemia, among other conditions.  Since last visit with me a month ago, patient has has no other medical appointments.  She has completed her screening mammogram.   Today she is due for urine albumin  and foot exam; we will reassess her DM2, as well as BP after recent increase in Entresto . We will offer flu shot.  At last visit she had requested a lift chair; I was unsuccessful in determining how to print a prescription she could take to a supplier.  She has recently qualified for Medicaid.   Patient Active Problem List   Diagnosis Date Noted   Atypical chest pain 08/21/2023   S/P MVR (mitral valve replacement) 06/23/2023   Malnutrition of moderate degree 06/19/2023   Iron  deficiency anemia 05/19/2023   IDA (iron  deficiency anemia) 05/19/2023   Chronic anticoagulation 10/18/2022   History of fracture of left hip 10/17/2022   Generalized pain 08/01/2022   Multifactorial functional impairment 02/13/2022   History of stroke without residual deficits 01/17/2022   Diabetic peripheral neuropathy associated with type 2 diabetes mellitus (HCC) 11/11/2021   Recurrent falls 11/11/2021   Gait instability 09/19/2021   Perennial non-allergic rhinitis 09/19/2021   Chronic, continuous use of opioids for chronic pain 07/10/2021   Polypharmacy 11/08/2020   Personal history of renal cell carcinoma 09/12/2020   Tinea pedis of both feet 08/07/2020   Essential tremor 11/17/2019   Nocturnal hypoxia per sleep study 07/2019    Carotid artery stenosis; s/p R endarterectomy    COPD mixed type (HCC)    Arthritis of both hands 07/01/2019   Stage 2 chronic kidney disease due to type 2 diabetes mellitus (HCC) 12/16/2018   Lichen sclerosus of female genitalia 01/12/2017   Mixed stress and urge urinary incontinence 05/27/2016    Pulmonary hypertension due to chronic obstructive pulmonary disease (HCC) 04/25/2016   Asymptomatic cholelithiasis 09/25/2015   Chronic iron  deficiency anemia 12/26/2014   Aortic atherosclerosis (HCC) 10/19/2014   Right nephrolithiasis, asymptomatic (incidental finding) 09/06/2014   Headache, chronic intermittent 01/14/2013   Chronic low back pain 10/06/2012   Long term current use of anticoagulant therapy 09/30/2012   Recurrent candidal intertrigo 07/28/2012   Tobacco use disorder 07/28/2012   History of colonic polyps 05/14/2011   Sleep disturbance 05/14/2011   Abdominal wall hernia 05/14/2011   Constipation due to opioid therapy 02/03/2011   Health care maintenance 11/04/2010   PAF (paroxysmal atrial fibrillation) (HCC) 10/22/2010   Chronic heart failure with preserved ejection fraction (HFpEF) (HCC) 10/21/2010   Fibromyalgia 08/29/2010   Gastroesophageal reflux disease    Type 2 diabetes mellitus with complication, with long-term current use of insulin  (HCC)    Osteoporosis with current pathological fracture with routine healing    Peripheral arterial disease (HCC) 05/27/2010   History of mitral valve replacement with bioprosthetic valve 2012   Systolic essential hypertension 06/19/2008   Hyperlipidemia 11/20/2005   Severe major depression (HCC) 11/19/2005    Current Outpatient Medications:    albuterol  (VENTOLIN  HFA) 108 (90 Base) MCG/ACT inhaler, Inhale 2 puffs into the lungs every 6 hours as needed for shortness of breath, Disp: 54 g, Rfl: 10   ALPRAZolam  (XANAX ) 0.5 MG tablet, Take 1 tablet (0.5 mg total) by mouth at bedtime. To help with sleep., Disp: 30 tablet, Rfl: 5   buPROPion  (WELLBUTRIN  XL) 150 MG 24 hr tablet, Take  1 tablet (150 mg total) by mouth every morning., Disp: 90 tablet, Rfl: 3   carvedilol  (COREG ) 6.25 MG tablet, Take 1 tablet (6.25 mg total) by mouth 2 (two) times daily with a meal., Disp: 60 tablet, Rfl: 11   Continuous Glucose Sensor (FREESTYLE LIBRE 3  PLUS SENSOR) MISC, Change sensor every 15 days., Disp: 6 each, Rfl: 3   diclofenac  Sodium (VOLTAREN ) 1 % GEL, Apply 4 g topically 3 (three) times daily as needed (joint pain)., Disp: 50 g, Rfl: 11   Fluticasone -Umeclidin-Vilant (TRELEGY ELLIPTA ) 100-62.5-25 MCG/ACT AEPB, Inhale 1 puff into the lungs daily., Disp: 180 each, Rfl: 3   furosemide  (LASIX ) 40 MG tablet, Take 1 tablet (40 mg total) by mouth daily., Disp: 30 tablet, Rfl: 11   gabapentin  (NEURONTIN ) 600 MG tablet, Take 1 tablet (600 mg total) by mouth at bedtime., Disp: 90 tablet, Rfl: 3   HYDROcodone -acetaminophen  (NORCO/VICODIN) 5-325 MG tablet, Take 2 tablets by mouth 2 (two) times daily., Disp: 120 tablet, Rfl: 0   insulin  degludec (TRESIBA ) 100 UNIT/ML FlexTouch Pen, Inject 19 Units into the skin every morning., Disp: 3 mL, Rfl: 6   Insulin  Pen Needle 32G X 4 MM MISC, Use to inject insulin  daily, Disp: 100 each, Rfl: 3   ipratropium (ATROVENT ) 0.03 % nasal spray, Place 2 sprays into the nose 3 (three) times daily., Disp: 30 mL, Rfl: 0   ipratropium-albuterol  (DUONEB) 0.5-2.5 (3) MG/3ML SOLN, Inhale 3 mLs by nebulization every 6 (six) hours as needed (shortness of breath, wheezing)., Disp: 90 mL, Rfl: 3   lidocaine  (LIDODERM ) 5 %, Place 1 or 2 patches to painful area of back each day.  Remove & Discard patch within 12 hours or as directed by MD, Disp: 60 patch, Rfl: 2   linaclotide  (LINZESS ) 72 MCG capsule, Take 1 capsule (72 mcg total) by mouth daily before breakfast., Disp: 90 capsule, Rfl: 3   omeprazole  (PRILOSEC) 40 MG capsule, Take 1 capsule (40 mg total) by mouth 2 (two) times daily., Disp: 60 capsule, Rfl: 5   ondansetron  (ZOFRAN ) 4 MG tablet, Take 1 tablet (4 mg total) by mouth daily as needed for nausea or vomiting., Disp: 30 tablet, Rfl: 1   OXYGEN , Inhale 2 L into the lungs at bedtime., Disp: , Rfl:    potassium chloride  (KLOR-CON  M) 10 MEQ tablet, Take 1 tablet (10 mEq total) by mouth daily., Disp: 30 tablet, Rfl: 11    ramelteon  (ROZEREM ) 8 MG tablet, Take 1 tablet (8 mg total) by mouth at bedtime., Disp: 30 tablet, Rfl: 3   sacubitril -valsartan  (ENTRESTO ) 97-103 MG, Take 1 tablet by mouth 2 (two) times daily., Disp: 180 tablet, Rfl: 3   venlafaxine  XR (EFFEXOR -XR) 150 MG 24 hr capsule, Take 1 capsule (150 mg total) by mouth daily with breakfast., Disp: 90 capsule, Rfl: 3   warfarin (COUMADIN ) 2.5 MG tablet, Current instructions:  Take 1 & 1/2 of your 2.5 mg strength warfarin tablets on Mondays, Wednesdays and Fridays. All OTHER days, take ONLY ONE (1) tablet. Continue to perform your patient self testing, finger-stick, point of care INRs and report results to Pharmacist, Lynwood Lites., Disp: 108 tablet, Rfl: 1  Functional Status: ***  Objective There were no vitals taken for this visit.  Exam: ***  Assessment and Plan: Type 2 diabetes mellitus with complication, with long-term current use of insulin  (HCC)  Stage 2 chronic kidney disease due to type 2 diabetes mellitus (HCC)     No follow-ups on file.

## 2023-10-29 ENCOUNTER — Other Ambulatory Visit (HOSPITAL_COMMUNITY): Payer: Self-pay

## 2023-10-29 ENCOUNTER — Ambulatory Visit: Admitting: Internal Medicine

## 2023-10-29 ENCOUNTER — Encounter (HOSPITAL_COMMUNITY): Payer: Self-pay

## 2023-10-29 VITALS — BP 137/37 | HR 56 | Temp 97.6°F | Ht 62.0 in | Wt 136.2 lb

## 2023-10-29 DIAGNOSIS — D509 Iron deficiency anemia, unspecified: Secondary | ICD-10-CM | POA: Diagnosis not present

## 2023-10-29 DIAGNOSIS — K5909 Other constipation: Secondary | ICD-10-CM

## 2023-10-29 DIAGNOSIS — F322 Major depressive disorder, single episode, severe without psychotic features: Secondary | ICD-10-CM | POA: Diagnosis not present

## 2023-10-29 DIAGNOSIS — E1122 Type 2 diabetes mellitus with diabetic chronic kidney disease: Secondary | ICD-10-CM | POA: Diagnosis not present

## 2023-10-29 DIAGNOSIS — D631 Anemia in chronic kidney disease: Secondary | ICD-10-CM

## 2023-10-29 DIAGNOSIS — J449 Chronic obstructive pulmonary disease, unspecified: Secondary | ICD-10-CM | POA: Diagnosis not present

## 2023-10-29 DIAGNOSIS — M5442 Lumbago with sciatica, left side: Secondary | ICD-10-CM

## 2023-10-29 DIAGNOSIS — N182 Chronic kidney disease, stage 2 (mild): Secondary | ICD-10-CM | POA: Diagnosis not present

## 2023-10-29 DIAGNOSIS — G8929 Other chronic pain: Secondary | ICD-10-CM

## 2023-10-29 DIAGNOSIS — E118 Type 2 diabetes mellitus with unspecified complications: Secondary | ICD-10-CM | POA: Diagnosis not present

## 2023-10-29 DIAGNOSIS — Z23 Encounter for immunization: Secondary | ICD-10-CM | POA: Diagnosis not present

## 2023-10-29 DIAGNOSIS — Z7951 Long term (current) use of inhaled steroids: Secondary | ICD-10-CM

## 2023-10-29 DIAGNOSIS — Z79899 Other long term (current) drug therapy: Secondary | ICD-10-CM

## 2023-10-29 DIAGNOSIS — M5441 Lumbago with sciatica, right side: Secondary | ICD-10-CM

## 2023-10-29 DIAGNOSIS — Z794 Long term (current) use of insulin: Secondary | ICD-10-CM

## 2023-10-29 DIAGNOSIS — I480 Paroxysmal atrial fibrillation: Secondary | ICD-10-CM

## 2023-10-29 DIAGNOSIS — R296 Repeated falls: Secondary | ICD-10-CM | POA: Diagnosis not present

## 2023-10-29 MED ORDER — HYDROCODONE-ACETAMINOPHEN 5-325 MG PO TABS
2.0000 | ORAL_TABLET | Freq: Two times a day (BID) | ORAL | 0 refills | Status: DC
  Filled 2023-10-29: qty 120, 30d supply, fill #0

## 2023-10-29 MED ORDER — LINACLOTIDE 72 MCG PO CAPS
72.0000 ug | ORAL_CAPSULE | Freq: Every day | ORAL | 3 refills | Status: AC
  Filled 2023-10-29 – 2023-12-10 (×2): qty 90, 90d supply, fill #0
  Filled 2024-03-10: qty 90, 90d supply, fill #1

## 2023-10-29 NOTE — Assessment & Plan Note (Signed)
 Stable; Trelegy every morning, rescue inhaler prn (not needed in recent days). Monitor.

## 2023-10-29 NOTE — Patient Instructions (Addendum)
 Hang in there, Ms. Skeet.  You can only do your best.  There aren't easy fixes for your medical conditions, and we have to just keep putting one foot in front of the other.  I'll make sure you have refills of everything after today's visit.  I can message you on Mychart if we need to react to today's tests.  I'm going to refer you to a mental health therapist.  I can't guarantee who you will be scheduled with, but I do think it's important to find someone to be your confidant and help with coping.  No medicine changes today.  Let's get together in about 6 weeks.    Take care and stay well,  Dr. Trudy

## 2023-10-29 NOTE — Assessment & Plan Note (Signed)
 eGFR has been >60.  Assess UACR today. No longer on GLP1a as further weight loss not desired.  On ARB.  Have not yet discussed empagliflozin .

## 2023-10-29 NOTE — Assessment & Plan Note (Signed)
 Auscultates regular today. Anticoagulated.

## 2023-10-29 NOTE — Assessment & Plan Note (Signed)
 She is very tired, fatigued.  Recheck ferritin today (she tends to drop quickly).  Next visit with hematologist 01/2024.

## 2023-10-29 NOTE — Assessment & Plan Note (Signed)
 Very hypoactive, desiring to sleep.  Agrees that perhaps meeting with a therapist would be helpful.  I will refer. She is taking Venlafaxine  for co-indication with chronic pain.

## 2023-10-29 NOTE — Assessment & Plan Note (Addendum)
 Ophthalmologist Dr. Octavia 01/2024 Foot exam completed A1c 6.9 a month ago UACR today

## 2023-10-29 NOTE — Assessment & Plan Note (Signed)
 3 falls since last visit. Similar scenarios of losing balance or falling out of chair when asleep.  Has completed HHPT.

## 2023-10-30 ENCOUNTER — Other Ambulatory Visit: Payer: Self-pay

## 2023-10-30 ENCOUNTER — Other Ambulatory Visit (HOSPITAL_COMMUNITY): Payer: Self-pay

## 2023-10-30 LAB — LIPID PANEL
Chol/HDL Ratio: 7.9 ratio — ABNORMAL HIGH (ref 0.0–4.4)
Cholesterol, Total: 276 mg/dL — ABNORMAL HIGH (ref 100–199)
HDL: 35 mg/dL — ABNORMAL LOW (ref >39)
LDL Chol Calc (NIH): 183 mg/dL — ABNORMAL HIGH (ref 0–99)
Triglycerides: 296 mg/dL — ABNORMAL HIGH (ref 0–149)
VLDL Cholesterol Cal: 58 mg/dL — ABNORMAL HIGH (ref 5–40)

## 2023-10-30 LAB — MICROALBUMIN / CREATININE URINE RATIO
Creatinine, Urine: 143 mg/dL
Microalb/Creat Ratio: 165 mg/g{creat} — ABNORMAL HIGH (ref 0–29)
Microalbumin, Urine: 236.4 ug/mL

## 2023-10-30 LAB — FERRITIN: Ferritin: 55 ng/mL (ref 15–150)

## 2023-11-02 ENCOUNTER — Telehealth: Payer: Self-pay | Admitting: *Deleted

## 2023-11-02 ENCOUNTER — Other Ambulatory Visit: Payer: Self-pay

## 2023-11-02 ENCOUNTER — Other Ambulatory Visit (HOSPITAL_COMMUNITY): Payer: Self-pay

## 2023-11-02 NOTE — Telephone Encounter (Signed)
 Copied from CRM #[id number]. Topic: General - Other >> Oct 30, 2023  4:32 PM Miquel SAILOR wrote: Reason for CRM: Patient does not have MyChart. call only [phone] Just fyi for office

## 2023-11-02 NOTE — Telephone Encounter (Signed)
 INR 1.8 on 18.75 mg warfarin/wk. Missed dose on Tuesday 16-SEP-25. INCREASING total weekly dose to 20 mg warfarin per week as:  Su-2.5mg M-3.75mg T-2.5mg W-2.5mg Th-3.75mg F-2.5mg Sa-2.5mg . Repeat INR 29SEP25.

## 2023-11-03 DIAGNOSIS — Z7901 Long term (current) use of anticoagulants: Secondary | ICD-10-CM | POA: Diagnosis not present

## 2023-11-03 DIAGNOSIS — I480 Paroxysmal atrial fibrillation: Secondary | ICD-10-CM | POA: Diagnosis not present

## 2023-11-05 ENCOUNTER — Other Ambulatory Visit (HOSPITAL_COMMUNITY): Payer: Self-pay

## 2023-11-05 ENCOUNTER — Other Ambulatory Visit: Payer: Self-pay

## 2023-11-09 NOTE — Telephone Encounter (Signed)
 Pt reports PST FS POC INR = 2.0 (target range per Dr. Trudy is 2.0 - 2.5) on 1&1/1 x 2.5 mg (3.75mg ) on Mon/Thurs; all other days, taking 1x2.5 mg warfarin. Advised to CONTINUE this same regimen, repeat INR 6OCT25. No bleeding. No new meds or missed doses.

## 2023-11-11 ENCOUNTER — Other Ambulatory Visit: Payer: Self-pay

## 2023-11-11 ENCOUNTER — Other Ambulatory Visit (HOSPITAL_COMMUNITY): Payer: Self-pay

## 2023-11-11 ENCOUNTER — Ambulatory Visit: Payer: Self-pay | Admitting: Internal Medicine

## 2023-11-11 DIAGNOSIS — I739 Peripheral vascular disease, unspecified: Secondary | ICD-10-CM

## 2023-11-11 DIAGNOSIS — Z8673 Personal history of transient ischemic attack (TIA), and cerebral infarction without residual deficits: Secondary | ICD-10-CM

## 2023-11-11 DIAGNOSIS — E782 Mixed hyperlipidemia: Secondary | ICD-10-CM

## 2023-11-11 MED ORDER — ROSUVASTATIN CALCIUM 20 MG PO TABS
20.0000 mg | ORAL_TABLET | Freq: Every day | ORAL | 11 refills | Status: AC
  Filled 2023-11-11: qty 30, 30d supply, fill #0
  Filled 2023-12-10: qty 30, 30d supply, fill #1
  Filled 2024-01-04: qty 30, 30d supply, fill #2
  Filled 2024-02-02: qty 30, 30d supply, fill #3
  Filled 2024-03-10: qty 30, 30d supply, fill #4

## 2023-11-11 NOTE — Progress Notes (Signed)
 Uncontrol reflects cessation of statin in attempt to address a possible cause of generalized muscle pain.  Pain reportedly improved immediately but soon returned despite remaining off the statin. Statin myopathy unlikely.  Will resume.

## 2023-11-11 NOTE — Progress Notes (Signed)
 [name], We have some work to do on your cholesterol, which is now elevated because we had stopped the rosuvastatin  cholesterol medicine, to see if it was responsible for your pain (it wasn't).  I'll restart it now. I look forward to seein you next time! Dr. Trudy

## 2023-11-12 ENCOUNTER — Other Ambulatory Visit (HOSPITAL_COMMUNITY): Payer: Self-pay

## 2023-11-16 ENCOUNTER — Telehealth: Payer: Self-pay | Admitting: Pharmacist

## 2023-11-16 NOTE — Telephone Encounter (Signed)
 Pt reports INR result of her FS POC PST INR = 2.0 on Su-2.5mg M-3.75mg T-2.5mg W-2.5mg Th-3.75mg F-2.5mg Sa-2.5mg . Advised to continue this regimen, repeat INR 13OCT25. No bleeding, no embolic events. No new meds or missed doses.

## 2023-11-21 DIAGNOSIS — I270 Primary pulmonary hypertension: Secondary | ICD-10-CM | POA: Diagnosis not present

## 2023-11-21 DIAGNOSIS — I272 Pulmonary hypertension, unspecified: Secondary | ICD-10-CM | POA: Diagnosis not present

## 2023-11-21 DIAGNOSIS — R269 Unspecified abnormalities of gait and mobility: Secondary | ICD-10-CM | POA: Diagnosis not present

## 2023-11-21 DIAGNOSIS — J439 Emphysema, unspecified: Secondary | ICD-10-CM | POA: Diagnosis not present

## 2023-11-21 DIAGNOSIS — J9601 Acute respiratory failure with hypoxia: Secondary | ICD-10-CM | POA: Diagnosis not present

## 2023-11-21 DIAGNOSIS — G9340 Encephalopathy, unspecified: Secondary | ICD-10-CM | POA: Diagnosis not present

## 2023-11-21 DIAGNOSIS — I509 Heart failure, unspecified: Secondary | ICD-10-CM | POA: Diagnosis not present

## 2023-11-21 DIAGNOSIS — S72142D Displaced intertrochanteric fracture of left femur, subsequent encounter for closed fracture with routine healing: Secondary | ICD-10-CM | POA: Diagnosis not present

## 2023-11-21 DIAGNOSIS — G4736 Sleep related hypoventilation in conditions classified elsewhere: Secondary | ICD-10-CM | POA: Diagnosis not present

## 2023-11-21 DIAGNOSIS — G4734 Idiopathic sleep related nonobstructive alveolar hypoventilation: Secondary | ICD-10-CM | POA: Diagnosis not present

## 2023-11-21 DIAGNOSIS — J449 Chronic obstructive pulmonary disease, unspecified: Secondary | ICD-10-CM | POA: Diagnosis not present

## 2023-11-23 ENCOUNTER — Telehealth: Payer: Self-pay | Admitting: Pharmacist

## 2023-11-23 NOTE — Telephone Encounter (Signed)
 Pt. reports INR results from PST POC FS INR determination. Value3.1 on 20 mg warfarin/wk. No bleeding. No new medications, no missed doses. Adequate supply of warfarin on-hand. See documentation. Dose DECREASED from 20 mg warfarin/wk to 18.75 mg warfarin/wk as: Su-2.5mg M-2.5mg T-2.5mg W-2.5mg Th-3.75mg F-2.5mg Sa-2.5mg   Patient advised to contact clinic or seek medical attention if signs/symptoms of bleeding or thromboembolism occur.  Patient verbalized understanding by repeating back information and was advised to contact me if further medication-related questions arise. Patient was also provided an information handout.   Lynwood Lites, PharmD, CPP Clinical Pharmacist Practitioner Professor of Pharmacy

## 2023-11-25 ENCOUNTER — Other Ambulatory Visit (HOSPITAL_COMMUNITY): Payer: Self-pay

## 2023-11-25 ENCOUNTER — Other Ambulatory Visit: Payer: Self-pay

## 2023-11-30 ENCOUNTER — Telehealth: Payer: Self-pay | Admitting: Pharmacist

## 2023-11-30 DIAGNOSIS — Z7901 Long term (current) use of anticoagulants: Secondary | ICD-10-CM | POA: Diagnosis not present

## 2023-11-30 DIAGNOSIS — I509 Heart failure, unspecified: Secondary | ICD-10-CM | POA: Diagnosis not present

## 2023-11-30 DIAGNOSIS — J449 Chronic obstructive pulmonary disease, unspecified: Secondary | ICD-10-CM | POA: Diagnosis not present

## 2023-11-30 DIAGNOSIS — I480 Paroxysmal atrial fibrillation: Secondary | ICD-10-CM | POA: Diagnosis not present

## 2023-11-30 DIAGNOSIS — I270 Primary pulmonary hypertension: Secondary | ICD-10-CM | POA: Diagnosis not present

## 2023-11-30 DIAGNOSIS — J439 Emphysema, unspecified: Secondary | ICD-10-CM | POA: Diagnosis not present

## 2023-11-30 NOTE — Telephone Encounter (Signed)
 Pt reports INR result of PST FS POC testing = 3.0 (Target range 2.0 - 2.5) on 18.75 mg warfarin/wk. No bleeding, no new medications. No missed doses, Will REDUCE to 16.25 mg/wk, repeat INR 27OCT25.

## 2023-12-01 ENCOUNTER — Other Ambulatory Visit: Payer: Self-pay

## 2023-12-01 ENCOUNTER — Other Ambulatory Visit (HOSPITAL_COMMUNITY): Payer: Self-pay

## 2023-12-01 DIAGNOSIS — G8929 Other chronic pain: Secondary | ICD-10-CM

## 2023-12-01 MED ORDER — HYDROCODONE-ACETAMINOPHEN 5-325 MG PO TABS
2.0000 | ORAL_TABLET | Freq: Two times a day (BID) | ORAL | 0 refills | Status: DC
  Filled 2023-12-01: qty 120, 30d supply, fill #0

## 2023-12-02 ENCOUNTER — Other Ambulatory Visit: Payer: Self-pay

## 2023-12-04 ENCOUNTER — Other Ambulatory Visit (HOSPITAL_COMMUNITY): Payer: Self-pay

## 2023-12-07 ENCOUNTER — Telehealth: Payer: Self-pay | Admitting: *Deleted

## 2023-12-07 ENCOUNTER — Other Ambulatory Visit (HOSPITAL_COMMUNITY): Payer: Self-pay

## 2023-12-07 NOTE — Telephone Encounter (Signed)
 PST FS POC INR 1.8 after missed dose on 23OCT and 26OCT (has adequate supply, did not state why missed/omitted). Instructions are usually 2.5 mg warfarin qd except on Monday, takes 1/2 x 2.5 mg (1.25mg ). Stressed compliance to her regimen. Goal INR 2.0-2.5 Su-2.5mg M-1.25mg T-2.5mg W-2.5mg Th-2.5mg F-2.5mg Sa-2.5mg  instructions provided as a photo of these instructions which were texted back to the patient and she has acknowledge receipt. Patient advised to contact clinic or seek medical attention if signs/symptoms of bleeding or thromboembolism occur.  Patient verbalized understanding by repeating back information and was advised to contact me if further medication-related questions arise. Patient was also provided an information handout.  Lynwood Lites, PharmD, CPP Clinical Pharmacist Practitioner

## 2023-12-08 ENCOUNTER — Other Ambulatory Visit: Payer: Self-pay

## 2023-12-08 NOTE — Telephone Encounter (Signed)
 INTERNAL MEDICINE TEACHING ATTENDING ADDENDUM   I agree with these recommendations regarding anticoagulation management.   Charissa Bash, MD

## 2023-12-10 ENCOUNTER — Other Ambulatory Visit: Payer: Self-pay

## 2023-12-10 ENCOUNTER — Other Ambulatory Visit (HOSPITAL_COMMUNITY): Payer: Self-pay

## 2023-12-10 ENCOUNTER — Ambulatory Visit: Admitting: Internal Medicine

## 2023-12-10 VITALS — BP 127/54 | HR 71 | Temp 97.4°F | Ht 62.0 in | Wt 135.0 lb

## 2023-12-10 DIAGNOSIS — I6523 Occlusion and stenosis of bilateral carotid arteries: Secondary | ICD-10-CM

## 2023-12-10 DIAGNOSIS — J449 Chronic obstructive pulmonary disease, unspecified: Secondary | ICD-10-CM | POA: Diagnosis not present

## 2023-12-10 DIAGNOSIS — E1122 Type 2 diabetes mellitus with diabetic chronic kidney disease: Secondary | ICD-10-CM

## 2023-12-10 DIAGNOSIS — I5032 Chronic diastolic (congestive) heart failure: Secondary | ICD-10-CM | POA: Diagnosis not present

## 2023-12-10 DIAGNOSIS — Z794 Long term (current) use of insulin: Secondary | ICD-10-CM

## 2023-12-10 DIAGNOSIS — N3946 Mixed incontinence: Secondary | ICD-10-CM | POA: Diagnosis not present

## 2023-12-10 DIAGNOSIS — F17210 Nicotine dependence, cigarettes, uncomplicated: Secondary | ICD-10-CM | POA: Diagnosis not present

## 2023-12-10 DIAGNOSIS — E118 Type 2 diabetes mellitus with unspecified complications: Secondary | ICD-10-CM

## 2023-12-10 DIAGNOSIS — D631 Anemia in chronic kidney disease: Secondary | ICD-10-CM | POA: Diagnosis not present

## 2023-12-10 DIAGNOSIS — I10 Essential (primary) hypertension: Secondary | ICD-10-CM

## 2023-12-10 DIAGNOSIS — E782 Mixed hyperlipidemia: Secondary | ICD-10-CM

## 2023-12-10 DIAGNOSIS — I130 Hypertensive heart and chronic kidney disease with heart failure and stage 1 through stage 4 chronic kidney disease, or unspecified chronic kidney disease: Secondary | ICD-10-CM | POA: Diagnosis not present

## 2023-12-10 DIAGNOSIS — Z79899 Other long term (current) drug therapy: Secondary | ICD-10-CM

## 2023-12-10 DIAGNOSIS — D509 Iron deficiency anemia, unspecified: Secondary | ICD-10-CM

## 2023-12-10 DIAGNOSIS — G479 Sleep disorder, unspecified: Secondary | ICD-10-CM | POA: Diagnosis not present

## 2023-12-10 DIAGNOSIS — N182 Chronic kidney disease, stage 2 (mild): Secondary | ICD-10-CM

## 2023-12-10 DIAGNOSIS — R296 Repeated falls: Secondary | ICD-10-CM | POA: Diagnosis not present

## 2023-12-10 DIAGNOSIS — T402X5A Adverse effect of other opioids, initial encounter: Secondary | ICD-10-CM

## 2023-12-10 DIAGNOSIS — K5903 Drug induced constipation: Secondary | ICD-10-CM

## 2023-12-10 DIAGNOSIS — Z8249 Family history of ischemic heart disease and other diseases of the circulatory system: Secondary | ICD-10-CM

## 2023-12-10 DIAGNOSIS — D500 Iron deficiency anemia secondary to blood loss (chronic): Secondary | ICD-10-CM

## 2023-12-10 NOTE — Assessment & Plan Note (Signed)
 Has moved Trelegy later in the day, around late morning; has not needed albuterol  inhaler as much, maybe 3x/week.  Breathing feels comfortableHas been having a bothersome cough; Started a month or two ago; happening daily; productive, happening in paroxysms, feels better.  Sputum color has not changed.

## 2023-12-10 NOTE — Assessment & Plan Note (Signed)
 Has changed to more comfortable pullups.  She is managing.

## 2023-12-10 NOTE — Assessment & Plan Note (Signed)
 Bilateral carotid bruits auscultated today.

## 2023-12-10 NOTE — Assessment & Plan Note (Signed)
 Ferritin stores remained normal at last check 10/2023.  Next evaluation will be 01/2024 with her hematologist.

## 2023-12-10 NOTE — Assessment & Plan Note (Signed)
 Pain did not improve with cessation of statin.  Follow-up lipid panel showed very uncontrolled levels for which we have resumed her rosuvastatin  20 mg, tolerated well so far.  Will give her 3-4 months before rechecking.

## 2023-12-10 NOTE — Assessment & Plan Note (Signed)
 I am sleeping a little better.  She is not sure what to attribute this improvement.  Uses ramelteon  as needed.

## 2023-12-10 NOTE — Assessment & Plan Note (Signed)
 1 fall recalled since last visit, was bending over the bed doing a task, lost balance and fell backwards onto her bottom.  Most of her falls occurred during changing position or when shifting balance, such as when bending or twisting.

## 2023-12-10 NOTE — Patient Instructions (Addendum)
 Ms. Jonni, [place] on a roll!  Keep up the good work!  No tests or changes in medication today.  I'm pleased with how stable you've been, and look forward to staying like this for while.  I'll see you in about a month, depending on the schedule.  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  Take care and stay well,  Dr. Trudy

## 2023-12-10 NOTE — Assessment & Plan Note (Addendum)
 Wearing CGM; had a particularly low reading, asymptomatic, upon awakening; alarm set at 80.  Due for A1c next visit in a month. ACR 165 in 10/2023 unchanged from 1 year ago.

## 2023-12-10 NOTE — Assessment & Plan Note (Signed)
 ACR 165 in 10/2023 unchanged from 1 year ago.  On ARB for renal protection.  History of recurrent yeast infections-no plan at this time for empagliflozin .

## 2023-12-10 NOTE — Assessment & Plan Note (Signed)
 127/54 today, tolerating increased dose of Entresto  now at 97-103 twice daily, in addition to her furosemide  40 daily and carvedilol  6.25 twice daily, Coindication with CHF.

## 2023-12-10 NOTE — Progress Notes (Signed)
 73 y.o. [name] is here for routine close follow-up of multiple chronic conditions, though not due today for A1c until next visit.  She is feeling reasonably well, with no or worsening problems.   Keeping excellent written records, reviewed today, of daily weight, BP, and CBGs.  Taking her BP readings (which are high) usually before taking meds. She will start checking a couple hours after morning meds.  Had a fall a couple of weeks ago, standing and lost balance while bent over bed doing a taks; landed on posterior. Continues to feel unsteady when walking.  Most falls occur when changing positions or shifting balance.    Patient Active Problem List   Diagnosis Date Noted   Atypical chest pain 08/21/2023   S/P MVR (mitral valve replacement) 06/23/2023   Malnutrition of moderate degree 06/19/2023   Iron  deficiency anemia 05/19/2023   IDA (iron  deficiency anemia) 05/19/2023   Chronic anticoagulation 10/18/2022   History of fracture of left hip 10/17/2022   Generalized pain 08/01/2022   Multifactorial functional impairment 02/13/2022   History of stroke without residual deficits 01/17/2022   Diabetic peripheral neuropathy associated with type 2 diabetes mellitus (HCC) 11/11/2021   Recurrent falls 11/11/2021   Gait instability 09/19/2021   Perennial non-allergic rhinitis 09/19/2021   Chronic, continuous use of opioids for chronic pain 07/10/2021   Polypharmacy 11/08/2020   Personal history of renal cell carcinoma 09/12/2020   Tinea pedis of both feet 08/07/2020   Essential tremor 11/17/2019   Nocturnal hypoxia per sleep study 07/2019    Carotid artery stenosis; s/p R endarterectomy    COPD mixed type (HCC)    Arthritis of both hands 07/01/2019   Stage 2 chronic kidney disease due to type 2 diabetes mellitus (HCC) 12/16/2018   Lichen sclerosus of female genitalia 01/12/2017   Mixed stress and urge urinary incontinence 05/27/2016   Pulmonary hypertension due to chronic  obstructive pulmonary disease (HCC) 04/25/2016   Asymptomatic cholelithiasis 09/25/2015   Chronic iron  deficiency anemia 12/26/2014   Aortic atherosclerosis 10/19/2014   Right nephrolithiasis, asymptomatic (incidental finding) 09/06/2014   Headache, chronic intermittent 01/14/2013   Chronic low back pain 10/06/2012   Long term current use of anticoagulant therapy 09/30/2012   Recurrent candidal intertrigo 07/28/2012   Tobacco use disorder 07/28/2012   History of colonic polyps 05/14/2011   Sleep disturbance 05/14/2011   Abdominal wall hernia 05/14/2011   Constipation due to opioid therapy 02/03/2011   Health care maintenance 11/04/2010   PAF (paroxysmal atrial fibrillation) (HCC) 10/22/2010   Chronic heart failure with preserved ejection fraction (HFpEF) (HCC) 10/21/2010   Fibromyalgia 08/29/2010   Gastroesophageal reflux disease    Type 2 diabetes mellitus with complication, with long-term current use of insulin  (HCC)    Osteoporosis with current pathological fracture with routine healing    Peripheral arterial disease 05/27/2010   History of mitral valve replacement with bioprosthetic valve 2012   Systolic essential hypertension 06/19/2008   Hyperlipidemia 11/20/2005   Severe major depression (HCC) 11/19/2005   Current Outpatient Medications:    albuterol  (VENTOLIN  HFA) 108 (90 Base) MCG/ACT inhaler, Inhale 2 puffs into the lungs every 6 hours as needed for shortness of breath, Disp: 54 g, Rfl: 10   ALPRAZolam  (XANAX ) 0.5 MG tablet, Take 1 tablet (0.5 mg total) by mouth at bedtime. To help with sleep., Disp: 30 tablet, Rfl: 5   buPROPion  (WELLBUTRIN  XL) 150 MG 24 hr tablet, Take 1 tablet (150 mg total) by mouth every morning., Disp:  90 tablet, Rfl: 3   carvedilol  (COREG ) 6.25 MG tablet, Take 1 tablet (6.25 mg total) by mouth 2 (two) times daily with a meal., Disp: 60 tablet, Rfl: 11   Continuous Glucose Sensor (FREESTYLE LIBRE 3 PLUS SENSOR) MISC, Change sensor every 15 days., Disp:  6 each, Rfl: 3   diclofenac  Sodium (VOLTAREN ) 1 % GEL, Apply 4 g topically 3 (three) times daily as needed (joint pain)., Disp: 50 g, Rfl: 11   Fluticasone -Umeclidin-Vilant (TRELEGY ELLIPTA ) 100-62.5-25 MCG/ACT AEPB, Inhale 1 puff into the lungs daily., Disp: 180 each, Rfl: 3   furosemide  (LASIX ) 40 MG tablet, Take 1 tablet (40 mg total) by mouth daily., Disp: 30 tablet, Rfl: 11   gabapentin  (NEURONTIN ) 600 MG tablet, Take 1 tablet (600 mg total) by mouth at bedtime., Disp: 90 tablet, Rfl: 3   HYDROcodone -acetaminophen  (NORCO/VICODIN) 5-325 MG tablet, Take 2 tablets by mouth 2 (two) times daily., Disp: 120 tablet, Rfl: 0   insulin  degludec (TRESIBA ) 100 UNIT/ML FlexTouch Pen, Inject 19 Units into the skin every morning., Disp: 3 mL, Rfl: 6   Insulin  Pen Needle 32G X 4 MM MISC, Use to inject insulin  daily, Disp: 100 each, Rfl: 3   ipratropium (ATROVENT ) 0.03 % nasal spray, Place 2 sprays into the nose 3 (three) times daily., Disp: 30 mL, Rfl: 0   ipratropium-albuterol  (DUONEB) 0.5-2.5 (3) MG/3ML SOLN, Inhale 3 mLs by nebulization every 6 (six) hours as needed (shortness of breath, wheezing)., Disp: 90 mL, Rfl: 3   lidocaine  (LIDODERM ) 5 %, Place 1 or 2 patches to painful area of back each day.  Remove & Discard patch within 12 hours or as directed by MD, Disp: 60 patch, Rfl: 2   linaclotide  (LINZESS ) 72 MCG capsule, Take 1 capsule (72 mcg total) by mouth daily before breakfast., Disp: 90 capsule, Rfl: 3   omeprazole  (PRILOSEC) 40 MG capsule, Take 1 capsule (40 mg total) by mouth 2 (two) times daily., Disp: 60 capsule, Rfl: 5   ondansetron  (ZOFRAN ) 4 MG tablet, Take 1 tablet (4 mg total) by mouth daily as needed for nausea or vomiting., Disp: 30 tablet, Rfl: 1   OXYGEN , Inhale 2 L into the lungs at bedtime., Disp: , Rfl:    potassium chloride  (KLOR-CON  M) 10 MEQ tablet, Take 1 tablet (10 mEq total) by mouth daily., Disp: 30 tablet, Rfl: 11   ramelteon  (ROZEREM ) 8 MG tablet, Take 1 tablet (8 mg total)  by mouth at bedtime., Disp: 30 tablet, Rfl: 3   rosuvastatin  (CRESTOR ) 20 MG tablet, Take 1 tablet (20 mg total) by mouth daily., Disp: 30 tablet, Rfl: 11   sacubitril -valsartan  (ENTRESTO ) 97-103 MG, Take 1 tablet by mouth 2 (two) times daily., Disp: 180 tablet, Rfl: 3   venlafaxine  XR (EFFEXOR -XR) 150 MG 24 hr capsule, Take 1 capsule (150 mg total) by mouth daily with breakfast., Disp: 90 capsule, Rfl: 3   warfarin (COUMADIN ) 2.5 MG tablet, Current instructions:  Take 1 & 1/2 of your 2.5 mg strength warfarin tablets on Mondays, Wednesdays and Fridays. All OTHER days, take ONLY ONE (1) tablet. Continue to perform your patient self testing, finger-stick, point of care INRs and report results to Pharmacist, Lynwood Lites., Disp: 108 tablet, Rfl: 1  Functional Status:  Fully independent in IADLs. Needs assistance with any kind of heavy or exertional task.  Walks with Rollator, short distances.  Doesn't drive.  Lives alone.  Sister is her advocate and accompanies her to many visits.   Objective BP (!) 127/54 (BP Location:  Left Arm, Patient Position: Sitting, Cuff Size: Small)   Pulse 71   Temp (!) 97.4 F (36.3 C) (Oral)   Ht 5' 2 (1.575 m)   Wt 135 lb (61.2 kg)   SpO2 96%   BMI 24.69 kg/m  Exam: Lungs much clearer than I've ever heard! Very minimal rhonchi, no basilar rales. Bilateral carotid bruits.  Heart RRR, no significant murmur. Purple feet, warm, pulseless, no visible wounds.  Trace pretibial edema above ankles. R medial great toe nail fold tender, no ingrowing or separation noted.  Assessment and Plan:  Type 2 diabetes mellitus with complication, with long-term current use of insulin  (HCC) Wearing CGM; had a particularly low reading, asymptomatic, upon awakening; alarm set at 80.  Due for A1c next visit in a month. ACR 165 in 10/2023 unchanged from 1 year ago.  COPD mixed type (HCC) Has moved Trelegy later in the day, around late morning; has not needed albuterol  inhaler as much,  maybe 3x/week.  Breathing feels comfortableHas been having a bothersome cough; Started a month or two ago; happening daily; productive, happening in paroxysms, feels better.  Sputum color has not changed.   Stage 2 chronic kidney disease due to type 2 diabetes mellitus (HCC) ACR 165 in 10/2023 unchanged from 1 year ago.  On ARB for renal protection.  History of recurrent yeast infections-no plan at this time for empagliflozin .  Recurrent falls 1 fall recalled since last visit, was bending over the bed doing a task, lost balance and fell backwards onto her bottom.  Most of her falls occurred during changing position or when shifting balance, such as when bending or twisting.  Systolic essential hypertension 127/54 today, tolerating increased dose of Entresto  now at 97-103 twice daily, in addition to her furosemide  40 daily and carvedilol  6.25 twice daily, Coindication with CHF.  Sleep disturbance I am sleeping a little better.  She is not sure what to attribute this improvement.  Uses ramelteon  as needed.  Mixed stress and urge urinary incontinence Has changed to more comfortable pullups.  She is managing.  Iron  deficiency anemia Ferritin stores remained normal at last check 10/2023.  Next evaluation will be 01/2024 with her hematologist.  Hyperlipidemia Pain did not improve with cessation of statin.  Follow-up lipid panel showed very uncontrolled levels for which we have resumed her rosuvastatin  20 mg, tolerated well so far.  Will give her 3-4 months before rechecking.  Constipation due to opioid therapy Very well-controlled on Linzess  72 mcg daily.  Continue.  Chronic heart failure with preserved ejection fraction (HFpEF) (HCC) Well compensated on current regimen, tolerating increased dose of the Entresto .  See entry for hypertension.  Carotid artery stenosis; s/p R endarterectomy Bilateral carotid bruits auscultated today.

## 2023-12-10 NOTE — Assessment & Plan Note (Signed)
 Very well-controlled on Linzess  72 mcg daily.  Continue.

## 2023-12-10 NOTE — Assessment & Plan Note (Signed)
 Well compensated on current regimen, tolerating increased dose of the Entresto .  See entry for hypertension.

## 2023-12-16 ENCOUNTER — Other Ambulatory Visit: Payer: Self-pay

## 2023-12-16 ENCOUNTER — Other Ambulatory Visit (HOSPITAL_COMMUNITY): Payer: Self-pay

## 2023-12-21 ENCOUNTER — Other Ambulatory Visit (HOSPITAL_BASED_OUTPATIENT_CLINIC_OR_DEPARTMENT_OTHER): Payer: Self-pay

## 2023-12-21 NOTE — Telephone Encounter (Signed)
 Patient self testing, finger-stick INR reported by the patient as 3.5. Desired INR range is 2.0 -2.5 per Dr. Trudy, PCP for the patient. Patient instructed to OMIT today's dose; and reduce on subsequent days to:  Do not take any warfarin (Coumadin ) tablets on: Monday, November 10; Your weekly dosage of warfarin (Coumadin ) has changed. Starting Tuesday, November 11, begin taking the new dosage shown below. Continue taking this dosage until your next INR test. Patient does not endorse any signs or symptoms of bleeding, no new medications, no missed or extra doses of warfarin. No dietary changes. Will evaluate INR on 17-NOV-25.

## 2023-12-22 DIAGNOSIS — I270 Primary pulmonary hypertension: Secondary | ICD-10-CM | POA: Diagnosis not present

## 2023-12-22 DIAGNOSIS — J9601 Acute respiratory failure with hypoxia: Secondary | ICD-10-CM | POA: Diagnosis not present

## 2023-12-22 DIAGNOSIS — G4734 Idiopathic sleep related nonobstructive alveolar hypoventilation: Secondary | ICD-10-CM | POA: Diagnosis not present

## 2023-12-22 DIAGNOSIS — S72142D Displaced intertrochanteric fracture of left femur, subsequent encounter for closed fracture with routine healing: Secondary | ICD-10-CM | POA: Diagnosis not present

## 2023-12-22 DIAGNOSIS — J439 Emphysema, unspecified: Secondary | ICD-10-CM | POA: Diagnosis not present

## 2023-12-22 DIAGNOSIS — J449 Chronic obstructive pulmonary disease, unspecified: Secondary | ICD-10-CM | POA: Diagnosis not present

## 2023-12-22 DIAGNOSIS — I509 Heart failure, unspecified: Secondary | ICD-10-CM | POA: Diagnosis not present

## 2023-12-22 DIAGNOSIS — I272 Pulmonary hypertension, unspecified: Secondary | ICD-10-CM | POA: Diagnosis not present

## 2023-12-22 DIAGNOSIS — G4736 Sleep related hypoventilation in conditions classified elsewhere: Secondary | ICD-10-CM | POA: Diagnosis not present

## 2023-12-22 DIAGNOSIS — G9340 Encephalopathy, unspecified: Secondary | ICD-10-CM | POA: Diagnosis not present

## 2023-12-28 ENCOUNTER — Telehealth: Payer: Self-pay | Admitting: Internal Medicine

## 2023-12-28 ENCOUNTER — Telehealth: Payer: Self-pay | Admitting: Pharmacist

## 2023-12-28 DIAGNOSIS — Z7901 Long term (current) use of anticoagulants: Secondary | ICD-10-CM | POA: Diagnosis not present

## 2023-12-28 DIAGNOSIS — I480 Paroxysmal atrial fibrillation: Secondary | ICD-10-CM | POA: Diagnosis not present

## 2023-12-28 NOTE — Telephone Encounter (Signed)
 PST FS POC INR result = 2.0 on 13.75 mg warfarin per week using her 2.5 mg strength, green-colored warfarin tablets:  Su-2.5mg M-1.25mg T-1.25mg W-2.5mg Th-2.5mg F-1.25mg Sa-2.5mg  No bleeding, no new medications, no missed or omitted doses. Continue same regimen, repeat PST FS POC INR on Monday 24-OCT-25.

## 2023-12-28 NOTE — Telephone Encounter (Signed)
 Patient provides PST FS POC INR result 2.0 on 13.75 mg warfarin/wk using 2.5 mg strength, green-colored warfarin tablets. No missed doses, no extra doses, no new meds, no symptoms/sx of bleeding. CONTINUE same regimen. Repeat INR 24NOV25.   Lynwood Lites, PharmD, CPP Clinical Pharmacist Practitioner

## 2023-12-28 NOTE — Telephone Encounter (Addendum)
 Confirmed previous message with pt [name] next avail 03/07/24 Pt declined to see a different provider and accepted appt for 03/10/24 at @10 :45 No further action needed, phone call complete.Kathryn Fayson Cassady11/17/20252:22 PM

## 2023-12-28 NOTE — Telephone Encounter (Signed)
 Attempted to contact patient in reference to future appointment with Dr. Trudy, but no answer.  Left detailed message informing the patient if she is unable to come on 01/28/24 Dr. Trudy next available clinic appointment is not until 03/10/2024 and to call the office back as soon as possible to schedule an appointment on that date.   Copied from CRM #[id number]. Topic: Appointments - Scheduling Inquiry for Clinic >> Dec 24, 2023  1:04 PM Farrel B wrote: Reason for CRM: Tenasia Arminta Kaster [phone] called to cancel and reschedule her appt for December, she states that she will not have transportation on that day because he daughter has an appt and that's who brings her to the appt. She wanted something before the 18th of December or After I offered to place this visit with another provider but she only wants Dr. Trudy. Pt is on the wait list. She also stated she doesn't mind mornings, but she doesn't want anything very early

## 2023-12-31 DIAGNOSIS — J449 Chronic obstructive pulmonary disease, unspecified: Secondary | ICD-10-CM | POA: Diagnosis not present

## 2023-12-31 DIAGNOSIS — I509 Heart failure, unspecified: Secondary | ICD-10-CM | POA: Diagnosis not present

## 2023-12-31 DIAGNOSIS — J439 Emphysema, unspecified: Secondary | ICD-10-CM | POA: Diagnosis not present

## 2023-12-31 DIAGNOSIS — I270 Primary pulmonary hypertension: Secondary | ICD-10-CM | POA: Diagnosis not present

## 2024-01-01 ENCOUNTER — Ambulatory Visit (INDEPENDENT_AMBULATORY_CARE_PROVIDER_SITE_OTHER): Admitting: Podiatry

## 2024-01-01 DIAGNOSIS — B351 Tinea unguium: Secondary | ICD-10-CM

## 2024-01-01 DIAGNOSIS — M79674 Pain in right toe(s): Secondary | ICD-10-CM

## 2024-01-01 DIAGNOSIS — M79675 Pain in left toe(s): Secondary | ICD-10-CM

## 2024-01-04 ENCOUNTER — Other Ambulatory Visit: Payer: Self-pay | Admitting: Internal Medicine

## 2024-01-04 ENCOUNTER — Other Ambulatory Visit: Payer: Self-pay

## 2024-01-04 ENCOUNTER — Other Ambulatory Visit (HOSPITAL_COMMUNITY): Payer: Self-pay

## 2024-01-04 DIAGNOSIS — G8929 Other chronic pain: Secondary | ICD-10-CM

## 2024-01-04 MED ORDER — HYDROCODONE-ACETAMINOPHEN 5-325 MG PO TABS
2.0000 | ORAL_TABLET | Freq: Two times a day (BID) | ORAL | 0 refills | Status: DC
  Filled 2024-01-04: qty 120, 30d supply, fill #0

## 2024-01-08 NOTE — Progress Notes (Signed)
 Late cancellation.

## 2024-01-11 ENCOUNTER — Telehealth: Payer: Self-pay | Admitting: Pharmacist

## 2024-01-11 ENCOUNTER — Other Ambulatory Visit: Payer: Self-pay

## 2024-01-11 ENCOUNTER — Other Ambulatory Visit (HOSPITAL_COMMUNITY): Payer: Self-pay

## 2024-01-11 NOTE — Telephone Encounter (Signed)
 Pt reports PST FS POC INR = 1.6. on 13.75 mg warfarin/wk. No missed doses, no bleeding, no new medications she states. Will INCREASE to 15 mg warfarin/wk as: Su-2.5mg M-2.5mg T-1.25mg W-2.5mg Th-2.5mg F-1.25mg Sa-2.5mg . Repeat INR 8DEC25.   Lynwood Lites, PharmD, CPP Clinical Pharmacist Practitioner

## 2024-01-12 ENCOUNTER — Other Ambulatory Visit (HOSPITAL_COMMUNITY): Payer: Self-pay

## 2024-01-14 ENCOUNTER — Encounter: Payer: Self-pay | Admitting: Podiatry

## 2024-01-14 ENCOUNTER — Ambulatory Visit (INDEPENDENT_AMBULATORY_CARE_PROVIDER_SITE_OTHER): Admitting: Podiatry

## 2024-01-14 DIAGNOSIS — M79674 Pain in right toe(s): Secondary | ICD-10-CM

## 2024-01-14 DIAGNOSIS — B351 Tinea unguium: Secondary | ICD-10-CM

## 2024-01-14 DIAGNOSIS — M79675 Pain in left toe(s): Secondary | ICD-10-CM

## 2024-01-14 DIAGNOSIS — E1142 Type 2 diabetes mellitus with diabetic polyneuropathy: Secondary | ICD-10-CM

## 2024-01-14 NOTE — Progress Notes (Signed)
  Subjective:  Patient ID: [name], female    DOB: 12/06/1950,  MRN: [id number]  Chief Complaint  Patient presents with   RFC    DFC    73 y.o. female presents with the above complaint. History confirmed with patient. Patient presenting with pain related to dystrophic thickened elongated nails. Patient is unable to trim own nails related to nail dystrophy and/or mobility issues. Patient does have a history of T2DM. No significant calluses today.  Objective:  Physical Exam: warm, cft 3-5 seconds to the digits, diminished pedal hair growth, pedal skin atrophic nail exam onychomycosis of the toenails, pitting, onycholysis, and dystrophic nails greater than 3 mm thickening DP pulses palpable, PT pulses diminished, protective sensation intact, and vibratory sensation diminished, light today touch sensation decreased to the toes. Left Foot:  Pain with palpation of nails due to elongation and dystrophic growth.  Right Foot: Pain with palpation of nails due to elongation and dystrophic growth.   Assessment:   1. Pain due to onychomycosis of toenails of both feet   2. Diabetic polyneuropathy associated with type 2 diabetes mellitus (HCC)      Plan:  Patient was evaluated and treated and all questions answered.   #Onychomycosis with pain  -Nails palliatively debrided as below. -Educated on self-care - Chronic anticoagulation on Coumadin   Procedure: Nail Debridement Rationale: Pain Type of Debridement: manual, sharp debridement. Instrumentation: Nail nipper, rotary burr. Number of Nails: 10  Patient educated on diabetes. Discussed proper diabetic foot care and discussed risks and complications of disease. Educated patient in depth on reasons to return to the office immediately should he/she discover anything concerning or new on the feet. All questions answered. Discussed proper shoes as well.    Return in about 3 months (around 04/13/2024) for Diabetic Foot Care.          [name], DPM [facility] / [facility]

## 2024-01-18 ENCOUNTER — Telehealth: Payer: Self-pay | Admitting: Pharmacist

## 2024-01-18 NOTE — Telephone Encounter (Signed)
 Pt provided results of PST FS POC INR 1.8 on 15 mg warfarin/wk. No bleeding or symptoms/signs of embolic disease, no missed doses, no new meds. Dose increased from 15 mg warfarin/wk to 17.5 mg warfarin per wk as 1 x 2.5 mg warfarin PO qd, repeat INR 15DEC.   Lynwood Lites, PharmD, CPP Clinical Pharmacist Practitioner

## 2024-01-21 DIAGNOSIS — I509 Heart failure, unspecified: Secondary | ICD-10-CM | POA: Diagnosis not present

## 2024-01-21 DIAGNOSIS — G4736 Sleep related hypoventilation in conditions classified elsewhere: Secondary | ICD-10-CM | POA: Diagnosis not present

## 2024-01-21 DIAGNOSIS — J9601 Acute respiratory failure with hypoxia: Secondary | ICD-10-CM | POA: Diagnosis not present

## 2024-01-21 DIAGNOSIS — I272 Pulmonary hypertension, unspecified: Secondary | ICD-10-CM | POA: Diagnosis not present

## 2024-01-21 DIAGNOSIS — J449 Chronic obstructive pulmonary disease, unspecified: Secondary | ICD-10-CM | POA: Diagnosis not present

## 2024-01-21 DIAGNOSIS — J439 Emphysema, unspecified: Secondary | ICD-10-CM | POA: Diagnosis not present

## 2024-01-21 DIAGNOSIS — S72142D Displaced intertrochanteric fracture of left femur, subsequent encounter for closed fracture with routine healing: Secondary | ICD-10-CM | POA: Diagnosis not present

## 2024-01-21 DIAGNOSIS — G4734 Idiopathic sleep related nonobstructive alveolar hypoventilation: Secondary | ICD-10-CM | POA: Diagnosis not present

## 2024-01-21 DIAGNOSIS — G9340 Encephalopathy, unspecified: Secondary | ICD-10-CM | POA: Diagnosis not present

## 2024-01-21 DIAGNOSIS — I270 Primary pulmonary hypertension: Secondary | ICD-10-CM | POA: Diagnosis not present

## 2024-01-22 ENCOUNTER — Other Ambulatory Visit: Payer: Self-pay

## 2024-01-22 DIAGNOSIS — D500 Iron deficiency anemia secondary to blood loss (chronic): Secondary | ICD-10-CM

## 2024-01-25 ENCOUNTER — Ambulatory Visit: Admitting: Hematology

## 2024-01-25 ENCOUNTER — Inpatient Hospital Stay: Attending: Hematology

## 2024-01-25 ENCOUNTER — Telehealth: Payer: Self-pay | Admitting: Pharmacist

## 2024-01-25 VITALS — BP 130/43 | HR 58 | Temp 98.1°F | Resp 16 | Wt 137.7 lb

## 2024-01-25 DIAGNOSIS — D649 Anemia, unspecified: Secondary | ICD-10-CM

## 2024-01-25 DIAGNOSIS — D500 Iron deficiency anemia secondary to blood loss (chronic): Secondary | ICD-10-CM

## 2024-01-25 DIAGNOSIS — D509 Iron deficiency anemia, unspecified: Secondary | ICD-10-CM | POA: Diagnosis present

## 2024-01-25 LAB — CMP (CANCER CENTER ONLY)
ALT: 15 U/L (ref 0–44)
AST: 17 U/L (ref 15–41)
Albumin: 4.3 g/dL (ref 3.5–5.0)
Alkaline Phosphatase: 58 U/L (ref 38–126)
Anion gap: 9 (ref 5–15)
BUN: 18 mg/dL (ref 8–23)
CO2: 28 mmol/L (ref 22–32)
Calcium: 9.8 mg/dL (ref 8.9–10.3)
Chloride: 104 mmol/L (ref 98–111)
Creatinine: 0.99 mg/dL (ref 0.44–1.00)
GFR, Estimated: 60 mL/min — ABNORMAL LOW (ref >60)
Glucose, Bld: 157 mg/dL — ABNORMAL HIGH (ref 70–99)
Potassium: 4.8 mmol/L (ref 3.5–5.1)
Sodium: 141 mmol/L (ref 135–145)
Total Bilirubin: 0.4 mg/dL (ref 0.0–1.2)
Total Protein: 6.8 g/dL (ref 6.5–8.1)

## 2024-01-25 LAB — CBC WITH DIFFERENTIAL (CANCER CENTER ONLY)
Abs Immature Granulocytes: 0.02 K/uL (ref 0.00–0.07)
Basophils Absolute: 0.1 K/uL (ref 0.0–0.1)
Basophils Relative: 1 %
Eosinophils Absolute: 0.1 K/uL (ref 0.0–0.5)
Eosinophils Relative: 1 %
HCT: 40.1 % (ref 36.0–46.0)
Hemoglobin: 13.4 g/dL (ref 12.0–15.0)
Immature Granulocytes: 0 %
Lymphocytes Relative: 20 %
Lymphs Abs: 1.6 K/uL (ref 0.7–4.0)
MCH: 31.5 pg (ref 26.0–34.0)
MCHC: 33.4 g/dL (ref 30.0–36.0)
MCV: 94.1 fL (ref 80.0–100.0)
Monocytes Absolute: 0.6 K/uL (ref 0.1–1.0)
Monocytes Relative: 8 %
Neutro Abs: 5.4 K/uL (ref 1.7–7.7)
Neutrophils Relative %: 70 %
Platelet Count: 196 K/uL (ref 150–400)
RBC: 4.26 MIL/uL (ref 3.87–5.11)
RDW: 13.4 % (ref 11.5–15.5)
WBC Count: 7.8 K/uL (ref 4.0–10.5)
nRBC: 0 % (ref 0.0–0.2)

## 2024-01-25 LAB — LACTATE DEHYDROGENASE: LDH: 178 U/L (ref 105–235)

## 2024-01-25 LAB — IRON AND IRON BINDING CAPACITY (CC-WL,HP ONLY)
Iron: 76 ug/dL (ref 28–170)
Saturation Ratios: 21 % (ref 10.4–31.8)
TIBC: 364 ug/dL (ref 250–450)
UIBC: 288 ug/dL

## 2024-01-25 LAB — FERRITIN: Ferritin: 30 ng/mL (ref 11–307)

## 2024-01-25 NOTE — Progress Notes (Signed)
 HEMATOLOGY ONCOLOGY PROGRESS NOTE  Date of service: 01/25/2024  Patient Care Team: Trudy Mliss Dragon, MD as PCP - General (Internal Medicine) Rolan Ezra RAMAN, MD as PCP - Advanced Heart Failure (Cardiology) Kate Lonni CROME, MD as PCP - Cardiology (Cardiology) Plyler, Arland HERO, RD as Dietitian (Dietician) Octavia Charleston, MD as Consulting Physician (Ophthalmology) Dyane Rush, MD (Inactive) as Consulting Physician (Gastroenterology) (Rounding), Imts GLENWOOD Lindau, MD as Attending Physician Jason Heather DEL, OTR as Occupational Therapist (Occupational Therapy)  CHIEF COMPLAINT/PURPOSE OF CONSULTATION: Follow-up for continued evaluation and management of chronic iron  deficiency anemia   HISTORY OF PRESENTING ILLNESS:  [name] is a wonderful 73 y.o. female who is here for evaluation and management of chronic iron  deficiency anemia.   Today, she is accompanied by her sister and presents in a wheelchair. Patient reports that she was seen by a different hematologist previously several years ago. Patient reports having anemia all her life. She reports that her hgb does go down multiple times, requiring her to present to the hospital to receive blood and iron .   Patient denies any concerns for bleeding, such as nose bleeds, gum bleeds, black stools,or blood in the stools.    She reports having multiple falls with balance issues and reports a fractured right wrist and hip. She reports that her hip had contact with a chair and she could barely walk for some time.  She fractured her hip in September and did have hip surgery. Unfortunately, while in a nursing home for rehab, she suffered a fall and fractured her right wrist. Patient reports that she was told to see a neurologist to evaluate her balance issues/falls and she is working on scheduling an appt. Her sister reports that patient has had balance issues for a while.   She reports that she felt that she was over-medicated  white being treated in the nursing facility following her hip surgery. She describes symptoms including paleness, lethargy, and confusion.    Her sister reports that patient continues to smoke more than a pack a day.   Patient reports a history of kidney cancer diagnosed 8-10 years ago. She reports that she had her first Cryoablation at that time and her second one five years later by Dr. Luverne. Patient reports that she required a shot every month. She does not follow a Insurance Underwriter. She follows with an interventional radiologist.     Patient reports that she was previously told that she did not need to use a CPAP machine 6 months ago. She was told that she does not have sleep apnea. She generally uses at-home oxygen  only when she uses her CPAP machine, which she typically uses everytime she sleeps. However, patient has issues with CPAP set up and has not used the CPAP machine recently.    She reports a hx of heart surgery. She is on coumadin  chronically for afib.    Patient has required blood transfusions twice a year over the last 5-7 years. Patient has not received erythropoietin  shots recently. Over the last 5-7 years, her anemia has been managed by her PCP.    She reports that she was told by hospital staff that she cannot receive iron  infusions due to reactions. She had iron  infusions 2-3 times in a hospital setting and not on an elective basis as outpatient. With her reaction to IV iron , her blood pressure dropped and she was given benedryl after the reaction.    Patient does take Prilosec.   She reports that she has  lost 55 pounds in 2-3 years.    She denies any other changes over the last 6 months.    She reports that she lives at home at this time.  SUMMARY OF ONCOLOGIC HISTORY: Oncology History   No problem history exists.    INTERVAL HISTORY: [name] is a 73 y.o. female who is here today for continued evaluation and management of chronic iron  deficiency anemia .  She is accompanied by her daughter.  she was last seen by me on 07/20/2023; at the time she did not have any concerns and was doing well.   Today, she notes that her energy level is good. She eats twice a day.   She denies any lightheadedness, abdominal pain, or blood in stool.  REVIEW OF SYSTEMS:   10 Point review of systems of done and is negative except as noted above.  MEDICAL HISTORY Past Medical History:  Diagnosis Date   Abnormality of lung on CXR 02/02/2020   Nonspecific finding on CXR ordered by pulmonologist - c/w inflammation vs infection, f/u imaging suggested, Dr. Saundra office has been in communication about recommended next steps.   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   Non-bleeding when visualized on capsule endoscopy 06/30/2015    Arthritis    lower back; hands (02/19/2018)   Asthma    Asymptomatic cholelithiasis 09/25/2015   Seen on CT scan 08/2015   Carotid artery stenosis; s/p R endarterectomy    s/p right endarterectomy (06/2010) Carotid US  (07/2010):  Left: Moderate-to-severe (60-79%) calcific and non-calcific plaque origin and proximal ICA and ECA    Chronic congestive heart failure with left ventricular diastolic dysfunction (HCC) 10/21/2010   Chronic constipation 02/03/2011   Chronic daily headache 01/16/2014   Chronic heart failure with preserved ejection fraction (HFpEF) (HCC) 10/21/2010   Chronic iron  deficiency anemia    Chronic low back pain 10/06/2012   Chronic venous insufficiency 08/04/2012   Closed fracture of one rib of left side 08/23/2021   ED visit after a fall 08/16/21    COPD (chronic obstructive pulmonary disease) with emphysema (HCC)    PFTs 2018: severe obstructive disease, insignif response to bronchiodilator, mild restriction parenchymal pattern, moderately severe diffusion defect. 2014  FEV1 0.92 (40%), ratio 69,  27% increase in FEV1 with BD, TLC 91%, severe airtrapping, DLCO49% On chronic home O2. Pulmonary rehab referral 05/2012    Dyspnea    Fibromyalgia 08/29/2010   Gastroesophageal reflux disease    History of blood transfusion    several times  (02/19/2018)   History of clear cell renal cell carcinoma (HCC), in remission 07/21/2011   s/p cryoablation of left RCC in 09/2011 by Dr. Luverne. Followed by Dr. Matilda  [facility]) .     History of fracture of left hip 10/17/2022   History of hiatal hernia    History of mitral valve replacement with bioprosthetic valve due to mitral stenosis 2012   s/p MVR with a 27-mm pericardial porcine valve (Medtronic Mosaic valve, serial #[id number] on 09/20/10, Dr. Fleeta Ochoa)    History of obstructive sleep apnea, resolved 2013   resolved per sleep study 07/2019; no apnea, but did have desaturation.  CPAP no longer necessary.  Nocturnal polysomnography (06/2009): Moderate sleep apnea/ hypopnea syndrome , AHI 17.8 per hour with nonpositional hypopneas. CPAP titration to 12 CWP, AHI 2.4 per hour. On nocturnal CPAP via a small resMed Quattro full-face mask  with heated humidifier.    History of pneumonia    once  (02/19/2018)   History of seborrheic keratosis 09/28/2015   History of stroke without residual deficits 01/17/2022   Hyperlipidemia LDL goal < 100 11/20/2005   Hypertensive urgency 05/20/2023   Internal and external hemorrhoids without complication 08/04/2012   Lesion of left native kidney 06/01/2020   Incidental finding on recent screening chest CT for lung ca ordered by Dr. Neysa pulmonology - he has ordered a f/u renal U/S.    Lichen sclerosus of female genitalia 01/12/2017   Migraine    none in years (02/19/2018)   Mild cognitive impairment with memory loss 12/23/2021   Moderately severe major depression (HCC) 11/19/2005   Nocturnal hypoxia per sleep study 07/2019    Osteoporosis    DEXA 2016: T -2.7; DEXA (12/09/2011): L-spine T -3.7,  left hip T -1.4 DEXA (12/2004): L-spine T -2.6, left hip -0.1    Paroxysmal atrial fibrillation (HCC) 10/22/2010   s/p Left atrial maze procedure for paroxysmal atrial fibrillation on 09/20/2010 by Dr Fleeta Ochoa.  Subsequent splenic infarct, decision was made to re-anticoagulate with coumadin , likely life-long as this is the most likely cause of the splenic infarct.    Personal history of colonic polyps 05/14/2011   Colonoscopy (05/2011): 4 mm adenomatous polyp excised endoscopically Colonoscopy (02/2002): Adenomatous polyp excised endoscopically    Personal history of renal cell carcinoma 09/12/2020   Pneumonia    Pulmonary hypertension due to chronic obstructive pulmonary disease (HCC) 04/25/2016   2014 TEE w PA peak pressure 46 mmHg, s/p MV replacement    Right nephrolithiasis, asymptomatic, incidental finding 09/06/2014   5 mm non-obstructing calculus seen on CT scan 09/05/2014    Right ventricular failure (HCC) 04/25/2016   Severe obesity (BMI 35.0-39.9) with comorbidity (HCC) 10/23/2011   Sleep apnea    Stage 2 chronic kidney disease due to type 2 diabetes mellitus (HCC) 12/16/2018   CKD stage III     Systolic essential hypertension 06/19/2008   Tobacco abuse 07/28/2012   Type 2 diabetes mellitus with complication, with long-term current use of insulin  (HCC)    Type 2 diabetes mellitus with diabetic neuropathy (HCC)    Weight loss due to medication 03/12/2023    SURGICAL HISTORY Past Surgical History:  Procedure Laterality Date   BIOPSY  07/19/2021   Procedure: BIOPSY;  Surgeon: Rosalie Kitchens, MD;  Location: [facility] ENDOSCOPY;  Service: Gastroenterology;;   CARDIAC CATHETERIZATION     CARDIAC VALVE REPLACEMENT  Aug. 2012   mitral valve   CAROTID ENDARTERECTOMY Right 07/04/2010   by Dr. Serene for asymptomatic right carotid artery stenosis   CATARACT EXTRACTION W/ INTRAOCULAR LENS  IMPLANT, BILATERAL Bilateral    CHEST TUBE INSERTION  09/24/2010   Dr Fleeta Trigt   COLONOSCOPY  05/12/2011    performed by Dr. Dianna. Showing small internal hemorrhoids, single tubular adenoma polyp   COLONOSCOPY WITH PROPOFOL  N/A 05/29/2020   Procedure: COLONOSCOPY WITH PROPOFOL ;  Surgeon: Rosalie Kitchens, MD;  Location: [facility] ENDOSCOPY;  Service: Endoscopy;  Laterality: N/A;   COLONOSCOPY WITH PROPOFOL  N/A 07/19/2021   Procedure: COLONOSCOPY WITH PROPOFOL ;  Surgeon: Rosalie Kitchens, MD;  Location: [facility];  Service: Gastroenterology;  Laterality: N/A;   CRYOABLATION Left 09/2011   by Dr. Luverne. Followed by Dr. Matilda  [facility]) .     DILATION AND CURETTAGE OF UTERUS     ESOPHAGOGASTRODUODENOSCOPY  05/12/2011   performed by Dr. Dianna. Negative for ulcerations, biopsy negative for evidence of celiac  sprue   ESOPHAGOGASTRODUODENOSCOPY N/A 06/29/2015   Procedure: ESOPHAGOGASTRODUODENOSCOPY (EGD);  Surgeon: Oliva Boots, MD;  Location: [facility] ENDOSCOPY;  Service: Endoscopy;  Laterality: N/A;   ESOPHAGOGASTRODUODENOSCOPY N/A 03/29/2016   Procedure: ESOPHAGOGASTRODUODENOSCOPY (EGD);  Surgeon: Oliva Boots, MD;  Location[facility] ENDOSCOPY;  Service: Endoscopy;  Laterality: N/A;   ESOPHAGOGASTRODUODENOSCOPY (EGD) WITH PROPOFOL  N/A 05/29/2020   Procedure: ESOPHAGOGASTRODUODENOSCOPY (EGD) WITH PROPOFOL ;  Surgeon: Boots Oliva, MD;  Location: [facility] ENDOSCOPY;  Service: Endoscopy;  Laterality: N/A;   FRACTURE SURGERY     GIVENS CAPSULE STUDY N/A 06/30/2015   Procedure: GIVENS CAPSULE STUDY;  Surgeon: Oliva Boots, MD;  Location: [facility] ENDOSCOPY;  Service: Endoscopy;  Laterality: N/A;   GIVENS CAPSULE STUDY N/A 06/29/2015   Procedure: GIVENS CAPSULE STUDY;  Surgeon: Oliva Boots, MD;  Location: [facility] ENDOSCOPY;  Service: Endoscopy;  Laterality: N/A;   HEMORRHOID SURGERY  1970s?   lanced   HYSTEROSCOPY W/ ENDOMETRIAL ABLATION  06/2001   for persistent post-menopausal bleeding // by S. Addie Mody, M.D.   INTRAMEDULLARY (IM) NAIL INTERTROCHANTERIC Left 10/19/2022   Procedure: LEFT INTRAMEDULLARY (IM) NAIL INTERTROCHANTERIC;   Surgeon: Sherida Adine BROCKS, MD;  Location: [facility] OR;  Service: Orthopedics;  Laterality: Left;   IR GENERIC HISTORICAL  08/23/2015   IR RADIOLOGIST EVAL & MGMT 08/23/2015 Marcey Moan, MD [facility] INTERV RAD   IR GENERIC HISTORICAL  04/09/2016   IR RADIOLOGIST EVAL & MGMT 04/09/2016 Marcey Moan, MD [facility] INTERV RAD   IR RADIOLOGIST EVAL & MGMT  10/07/2016   IR RADIOLOGIST EVAL & MGMT  06/25/2017   IR RADIOLOGIST EVAL & MGMT  07/31/2020   IR RADIOLOGIST EVAL & MGMT  10/02/2020   IR RADIOLOGIST EVAL & MGMT  12/20/2020   IR RADIOLOGIST EVAL & MGMT  12/25/2021   IR RADIOLOGIST EVAL & MGMT  04/16/2023   LEFT HEART CATH AND CORONARY ANGIOGRAPHY N/A 04/21/2016   Procedure: Left Heart Cath and Coronary Angiography;  Surgeon: Dorn JINNY Lesches, MD;  Location: [facility] INVASIVE CV LAB;  Service: Cardiovascular;  Laterality: N/A;   LIPOMA EXCISION  08/2005   occipital lipoma 1.5cm - by Dr. Debby   LITHOTRIPSY  ~ 2000   MAZE Left 09/20/10   for paroxysmal atrial fibrillation (Dr. Fleeta Ochoa)   MITRAL VALVE REPLACEMENT  09/20/10    with a 27-mm pericardial porcine valve (Medtronic Mosaic valve, serial #[id number]). 09/20/10, Dr Fleeta Ochoa   ORIF CLAVICLE FRACTURE Right 01/2004   by Oneil BROCKS. Barbarann, M.D for Right clavicle nonunion.; it's got a pin in it   POLYPECTOMY  05/29/2020   Procedure: POLYPECTOMY;  Surgeon: Boots Oliva, MD;  Location: [facility] ENDOSCOPY;  Service: Endoscopy;;   POLYPECTOMY  07/19/2021   Procedure: POLYPECTOMY;  Surgeon: Boots Oliva, MD;  Location: [facility] ENDOSCOPY;  Service: Gastroenterology;;   RADIOLOGY WITH ANESTHESIA Left 09/12/2020   Procedure: RADIOLOGY WITH ANESTHESIA- RENAL CRYOABLATION;  Surgeon: Moan Marcey, MD;  Location: [facility] ORS;  Service: Radiology;  Laterality: Left;   REFRACTIVE SURGERY Bilateral    RIGHT HEART CATH N/A 04/23/2016   Procedure: Right Heart Cath;  Surgeon: Ezra GORMAN Shuck, MD;  Location: [facility] INVASIVE CV LAB;  Service: Cardiovascular;  Laterality: N/A;   TONSILLECTOMY     TUBAL  LIGATION      SOCIAL HISTORY Social History[1]  Social History   Social History Narrative   Lives alone in [place] ([address])   Worked at [employer] for 18 years   No car    SOCIAL DRIVERS OF HEALTH SDOH Screenings  Food Insecurity: No Food Insecurity (06/18/2023)  Recent Concern: Food Insecurity - Food Insecurity Present (05/25/2023)  Housing: Low Risk (06/18/2023)  Transportation Needs: No Transportation Needs (06/18/2023)  Recent Concern: Transportation Needs - Unmet Transportation Needs (04/08/2023)  Utilities: Not At Risk (06/18/2023)  Alcohol Screen: Low Risk (04/08/2023)  Depression (PHQ2-9): Medium Risk (07/15/2023)  Financial Resource Strain: Low Risk (04/08/2023)  Physical Activity: Inactive (04/08/2023)  Social Connections: Socially Isolated (06/18/2023)  Stress: Stress Concern Present (04/08/2023)  Tobacco Use: High Risk (01/14/2024)  Health Literacy: Adequate Health Literacy (04/08/2023)     FAMILY HISTORY Family History  Problem Relation Age of Onset   Pneumonia Mother    Peptic Ulcer Disease Father    Heart attack Father 58       Died of MI at age 90   Healthy Sister    Lupus Daughter    Obsessive Compulsive Disorder Daughter    Heart attack Brother 59       Died of MI at age 60   Obesity Brother    Breast cancer Neg Hx    BRCA 1/2 Neg Hx      ALLERGIES: is allergic to dexilant [dexlansoprazole], metoclopramide , isovue  [iopamidol ], lorazepam, oxycontin  [oxycodone ], and tramadol hcl.  MEDICATIONS  Current Outpatient Medications  Medication Sig Dispense Refill   albuterol  (VENTOLIN  HFA) 108 (90 Base) MCG/ACT inhaler Inhale 2 puffs into the lungs every 6 hours as needed for shortness of breath 54 g 10   ALPRAZolam  (XANAX ) 0.5 MG tablet Take 1 tablet (0.5 mg total) by mouth at bedtime. To help with sleep. 30 tablet 5   buPROPion  (WELLBUTRIN  XL) 150 MG 24 hr tablet Take 1 tablet (150 mg total) by mouth every morning. 90 tablet 3   carvedilol  (COREG )  6.25 MG tablet Take 1 tablet (6.25 mg total) by mouth 2 (two) times daily with a meal. 60 tablet 11   Continuous Glucose Sensor (FREESTYLE LIBRE 3 PLUS SENSOR) MISC Change sensor every 15 days. 6 each 3   diclofenac  Sodium (VOLTAREN ) 1 % GEL Apply 4 g topically 3 (three) times daily as needed (joint pain). 50 g 11   Fluticasone -Umeclidin-Vilant (TRELEGY ELLIPTA ) 100-62.5-25 MCG/ACT AEPB Inhale 1 puff into the lungs daily. 180 each 3   furosemide  (LASIX ) 40 MG tablet Take 1 tablet (40 mg total) by mouth daily. 30 tablet 11   gabapentin  (NEURONTIN ) 600 MG tablet Take 1 tablet (600 mg total) by mouth at bedtime. 90 tablet 3   HYDROcodone -acetaminophen  (NORCO/VICODIN) 5-325 MG tablet Take 2 tablets by mouth 2 (two) times daily. 120 tablet 0   insulin  degludec (TRESIBA ) 100 UNIT/ML FlexTouch Pen Inject 19 Units into the skin every morning. 3 mL 6   Insulin  Pen Needle 32G X 4 MM MISC Use to inject insulin  daily 100 each 3   ipratropium (ATROVENT ) 0.03 % nasal spray Place 2 sprays into the nose 3 (three) times daily. 30 mL 0   ipratropium-albuterol  (DUONEB) 0.5-2.5 (3) MG/3ML SOLN Inhale 3 mLs by nebulization every 6 (six) hours as needed (shortness of breath, wheezing). 90 mL 3   lidocaine  (LIDODERM ) 5 % Place 1 or 2 patches to painful area of back each day.  Remove & Discard patch within 12 hours or as directed by MD 60 patch 2   linaclotide  (LINZESS ) 72 MCG capsule Take 1 capsule (72 mcg total) by mouth daily before breakfast. 90 capsule 3   omeprazole  (PRILOSEC) 40 MG capsule Take 1 capsule (40 mg total) by mouth 2 (two) times daily. 60  capsule 5   ondansetron  (ZOFRAN ) 4 MG tablet Take 1 tablet (4 mg total) by mouth daily as needed for nausea or vomiting. 30 tablet 1   OXYGEN  Inhale 2 L into the lungs at bedtime.     potassium chloride  (KLOR-CON  M) 10 MEQ tablet Take 1 tablet (10 mEq total) by mouth daily. 30 tablet 11   ramelteon  (ROZEREM ) 8 MG tablet Take 1 tablet (8 mg total) by mouth at bedtime. 30  tablet 3   rosuvastatin  (CRESTOR ) 20 MG tablet Take 1 tablet (20 mg total) by mouth daily. 30 tablet 11   sacubitril -valsartan  (ENTRESTO ) 97-103 MG Take 1 tablet by mouth 2 (two) times daily. 180 tablet 3   venlafaxine  XR (EFFEXOR -XR) 150 MG 24 hr capsule Take 1 capsule (150 mg total) by mouth daily with breakfast. 90 capsule 3   warfarin (COUMADIN ) 2.5 MG tablet Current instructions:  Take 1 & 1/2 of your 2.5 mg strength warfarin tablets on Mondays, Wednesdays and Fridays. All OTHER days, take ONLY ONE (1) tablet. Continue to perform your patient self testing, finger-stick, point of care INRs and report results to Pharmacist, James Groce. 108 tablet 1   No current facility-administered medications for this visit.    PHYSICAL EXAMINATION: ECOG PERFORMANCE STATUS: 1 - Symptomatic but completely ambulatory VITALS: Vitals:   01/25/24 1227  BP: (!) 130/43  Pulse: (!) 58  Resp: 16  Temp: 98.1 F (36.7 C)  SpO2: 97%   Filed Weights   01/25/24 1227  Weight: 137 lb 11.2 oz (62.5 kg)   Body mass index is 25.19 kg/m.  GENERAL: alert, in no acute distress and comfortable SKIN: no acute rashes, no significant lesions EYES: conjunctiva are pink and non-injected, sclera anicteric OROPHARYNX: MMM, no exudates, no oropharyngeal erythema or ulceration NECK: supple, no JVD LYMPH:  no palpable lymphadenopathy in the cervical, axillary or inguinal regions LUNGS: clear to auscultation b/l with normal respiratory effort HEART: regular rate & rhythm ABDOMEN:  normoactive bowel sounds , non tender, not distended, no hepatosplenomegaly Extremity: no pedal edema PSYCH: alert & oriented x 3 with fluent speech NEURO: no focal motor/sensory deficits  LABORATORY DATA:   I have reviewed the data as listed     Latest Ref Rng & Units 01/25/2024   12:00 PM 09/24/2023    9:41 AM 07/20/2023    1:57 PM  CBC EXTENDED  WBC 4.0 - 10.5 K/uL 7.8  6.8  6.9   RBC 3.87 - 5.11 MIL/uL 4.26  4.40  3.93    Hemoglobin 12.0 - 15.0 g/dL 86.5  86.3  89.1   HCT 36.0 - 46.0 % 40.1  41.9  34.9   Platelets 150 - 400 K/uL 196  201  204   NEUT# 1.7 - 7.7 K/uL 5.4   4.6   Lymph# 0.7 - 4.0 K/uL 1.6   1.4         Latest Ref Rng & Units 01/25/2024   12:00 PM 07/20/2023    1:57 PM 07/15/2023   12:55 PM  CMP  Glucose 70 - 99 mg/dL 842  861  883   BUN 8 - 23 mg/dL 18  19  26    Creatinine 0.44 - 1.00 mg/dL 9.00  9.27  8.99   Sodium 135 - 145 mmol/L 141  140  138   Potassium 3.5 - 5.1 mmol/L 4.8  4.5  4.2   Chloride 98 - 111 mmol/L 104  105  105   CO2 22 - 32 mmol/L 28  32  22   Calcium  8.9 - 10.3 mg/dL 9.8  9.8  9.3   Total Protein 6.5 - 8.1 g/dL 6.8  6.2  5.7   Total Bilirubin 0.0 - 1.2 mg/dL 0.4  0.4  0.5   Alkaline Phos 38 - 126 U/L 58  56  48   AST 15 - 41 U/L 17  14  15    ALT 0 - 44 U/L 15  17  15     LDH Component     Latest Ref Rng 01/25/2024  LDH     105 - 235 U/L 178    Iron  and Iron  Binding Capacity Component     Latest Ref Rng 01/25/2024  Iron      28 - 170 ug/dL 76   TIBC     749 - 549 ug/dL 635   Saturation Ratios     10.4 - 31.8 % 21   UIBC     ug/dL 711      RADIOGRAPHIC STUDIES: I have personally reviewed the radiological images as listed and agreed with the findings in the report. No results found.  ASSESSMENT & PLAN:  73 y.o. female with  Chronic iron  deficiency anemia Likely chronic slow GI losses. Also on chronic PPI therapy  PLAN: - Discussed lab results on 01/25/2024 in detail with patient: - CMP   - Creatinine: 0.99  - Calcium : 9.8 - CBC:  - WBC: 7.8  - Hemoglobin: 13.4  - Platelets: 196 - LDH is 178 - Iron  Saturation is 21  - Ferritin is pending  FOLLOW-UP in 6 months for labs and follow-up with Dr. Onesimo.  The total time spent in the appointment was *** minutes* .  All of the patient's questions were answered and the patient knows to call the clinic with any problems, questions, or concerns.  Emaline Onesimo MD MS AAHIVMS [facility]  [facility] Hematology/Oncology Physician [facility]  *Total Encounter Time as defined by the Centers for Medicare and Medicaid Services includes, in addition to the face-to-face time of a patient visit (documented in the note above) non-face-to-face time: obtaining and reviewing outside history, ordering and reviewing medications, tests or procedures, care coordination (communications with other health care professionals or caregivers) and documentation in the medical record.  I, Marijo Sharps, acting as a neurosurgeon for Emaline Onesimo, MD.,have documented all relevant documentation on the behalf of Emaline Onesimo, MD,as directed by  Emaline Onesimo, MD while in the presence of Emaline Onesimo, MD.  I have reviewed the above documentation for accuracy and completeness, and I agree with the above.  Emaline Onesimo, MD      [1]  Social History Tobacco Use   Smoking status: Every Day    Current packs/day: 1.00    Average packs/day: 1 pack/day for 61.2 years (61.2 ttl pk-yrs)    Types: Cigarettes    Start date: 11/01/1962   Smokeless tobacco: Never   Tobacco comments:    At least 1 PPD  Vaping Use   Vaping status: Never Used  Substance Use Topics   Alcohol use: Not Currently    Comment: 02/19/2018 nothing since 1999   Drug use: Not Currently    Types: Marijuana    Comment: 02/19/2018 nothing since the 1990s

## 2024-01-25 NOTE — Telephone Encounter (Signed)
 Pt provides result of PST FS POC INR = 3.30 (target goal 2.0 - 2.5 only). Too high today: No bleeding, no new medications, no missed or extra doses. Advised to DECREASE from 17.5 mg/wk to 15 mg/wk as: 1/2 x 2.5 mg (1.25mg  dose) M/Th; all OTHER days, 1 tab

## 2024-01-26 ENCOUNTER — Telehealth: Payer: Self-pay | Admitting: Hematology

## 2024-01-26 LAB — OPHTHALMOLOGY REPORT-SCANNED

## 2024-01-26 NOTE — Telephone Encounter (Signed)
 Scheduled patient for 6 month lab and follow-up. Called and left a voicemail with the details.

## 2024-01-27 ENCOUNTER — Other Ambulatory Visit (HOSPITAL_BASED_OUTPATIENT_CLINIC_OR_DEPARTMENT_OTHER): Payer: Self-pay

## 2024-01-28 ENCOUNTER — Ambulatory Visit: Admitting: Internal Medicine

## 2024-01-28 ENCOUNTER — Other Ambulatory Visit (HOSPITAL_BASED_OUTPATIENT_CLINIC_OR_DEPARTMENT_OTHER): Payer: Self-pay

## 2024-01-29 ENCOUNTER — Other Ambulatory Visit (HOSPITAL_BASED_OUTPATIENT_CLINIC_OR_DEPARTMENT_OTHER): Payer: Self-pay

## 2024-02-01 ENCOUNTER — Other Ambulatory Visit (HOSPITAL_COMMUNITY): Payer: Self-pay | Admitting: Hematology

## 2024-02-01 ENCOUNTER — Encounter: Payer: Self-pay | Admitting: Hematology

## 2024-02-01 ENCOUNTER — Telehealth (HOSPITAL_COMMUNITY): Payer: Self-pay | Admitting: Pharmacy Technician

## 2024-02-01 ENCOUNTER — Other Ambulatory Visit (HOSPITAL_BASED_OUTPATIENT_CLINIC_OR_DEPARTMENT_OTHER): Payer: Self-pay

## 2024-02-01 NOTE — Telephone Encounter (Signed)
 Pt contacted RE: results of PST FS POC INR = 2.5 (target range per Dr. Trudy 2.0 - 2.5). Advised to CONTINUE same regimen: 1/2 x 2.5 mg tablet (1.25mg  dose) M/Th; 1x2.5 mg all other days. Repeat INR 29DEC25.

## 2024-02-01 NOTE — Telephone Encounter (Signed)
 Auth Submission: NO AUTH NEEDED Site of care: [facility] Payer: [employer] Medication & CPT/J Code(s) submitted: Monoferric (Ferric derisomaltose) J1437 Diagnosis Code: D50.0 Route of submission (phone, fax, portal):  Phone # Fax # Auth type: Buy/Bill HB Units/visits requested: 1000MG  X 1 DOSE Reference number:  Approval from: 02/01/2024 to 04/03/24    Dagoberto Armour, CPhT [facility] Phone: [phone] 02/01/2024

## 2024-02-01 NOTE — Progress Notes (Incomplete)
 " HEMATOLOGY ONCOLOGY PROGRESS NOTE  Date of service: 01/25/2024  Patient Care Team: Trudy Mliss Dragon, MD as PCP - General (Internal Medicine) Rolan Ezra RAMAN, MD as PCP - Advanced Heart Failure (Cardiology) Kate Lonni CROME, MD as PCP - Cardiology (Cardiology) Plyler, Arland HERO, RD as Dietitian (Dietician) Octavia Charleston, MD as Consulting Physician (Ophthalmology) Dyane Rush, MD (Inactive) as Consulting Physician (Gastroenterology) (Rounding), Imts GLENWOOD Lindau, MD as Attending Physician Jason Heather DEL, OTR as Occupational Therapist (Occupational Therapy)  CHIEF COMPLAINT/PURPOSE OF CONSULTATION: Follow-up for continued evaluation and management of chronic iron  deficiency anemia   HISTORY OF PRESENTING ILLNESS:  [name] is a wonderful 73 y.o. female who is here for evaluation and management of chronic iron  deficiency anemia.   Today, she is accompanied by her sister and presents in a wheelchair. Patient reports that she was seen by a different hematologist previously several years ago. Patient reports having anemia all her life. She reports that her hgb does go down multiple times, requiring her to present to the hospital to receive blood and iron .   Patient denies any concerns for bleeding, such as nose bleeds, gum bleeds, black stools,or blood in the stools.    She reports having multiple falls with balance issues and reports a fractured right wrist and hip. She reports that her hip had contact with a chair and she could barely walk for some time.  She fractured her hip in September and did have hip surgery. Unfortunately, while in a nursing home for rehab, she suffered a fall and fractured her right wrist. Patient reports that she was told to see a neurologist to evaluate her balance issues/falls and she is working on scheduling an appt. Her sister reports that patient has had balance issues for a while.   She reports that she felt that she was over-medicated  white being treated in the nursing facility following her hip surgery. She describes symptoms including paleness, lethargy, and confusion.    Her sister reports that patient continues to smoke more than a pack a day.   Patient reports a history of kidney cancer diagnosed 8-10 years ago. She reports that she had her first Cryoablation at that time and her second one five years later by Dr. Luverne. Patient reports that she required a shot every month. She does not follow a Insurance Underwriter. She follows with an interventional radiologist.     Patient reports that she was previously told that she did not need to use a CPAP machine 6 months ago. She was told that she does not have sleep apnea. She generally uses at-home oxygen  only when she uses her CPAP machine, which she typically uses everytime she sleeps. However, patient has issues with CPAP set up and has not used the CPAP machine recently.    She reports a hx of heart surgery. She is on coumadin  chronically for afib.    Patient has required blood transfusions twice a year over the last 5-7 years. Patient has not received erythropoietin  shots recently. Over the last 5-7 years, her anemia has been managed by her PCP.    She reports that she was told by hospital staff that she cannot receive iron  infusions due to reactions. She had iron  infusions 2-3 times in a hospital setting and not on an elective basis as outpatient. With her reaction to IV iron , her blood pressure dropped and she was given benedryl after the reaction.    Patient does take Prilosec.   She reports that she  has lost 55 pounds in 2-3 years.    She denies any other changes over the last 6 months.    She reports that she lives at home at this time.  SUMMARY OF ONCOLOGIC HISTORY: Oncology History   No problem history exists.    INTERVAL HISTORY: [name] is a 73 y.o. female who is here today for continued evaluation and management of chronic iron  deficiency anemia .  She is accompanied by her daughter.  she was last seen by me on 07/20/2023; at the time she did not have any concerns and was doing well.   Today, she notes that her energy level is good. She eats twice a day.   She denies any lightheadedness, abdominal pain, or blood in stool.  REVIEW OF SYSTEMS:   10 Point review of systems of done and is negative except as noted above.  MEDICAL HISTORY Past Medical History:  Diagnosis Date   Abnormality of lung on CXR 02/02/2020   Nonspecific finding on CXR ordered by pulmonologist - c/w inflammation vs infection, f/u imaging suggested, Dr. Saundra office has been in communication about recommended next steps.   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   Non-bleeding when visualized on capsule endoscopy 06/30/2015    Arthritis    lower back; hands (02/19/2018)   Asthma    Asymptomatic cholelithiasis 09/25/2015   Seen on CT scan 08/2015   Carotid artery stenosis; s/p R endarterectomy    s/p right endarterectomy (06/2010) Carotid US  (07/2010):  Left: Moderate-to-severe (60-79%) calcific and non-calcific plaque origin and proximal ICA and ECA    Chronic congestive heart failure with left ventricular diastolic dysfunction (HCC) 10/21/2010   Chronic constipation 02/03/2011   Chronic daily headache 01/16/2014   Chronic heart failure with preserved ejection fraction (HFpEF) (HCC) 10/21/2010   Chronic iron  deficiency anemia    Chronic low back pain 10/06/2012   Chronic venous insufficiency 08/04/2012   Closed fracture of one rib of left side 08/23/2021   ED visit after a fall 08/16/21    COPD (chronic obstructive pulmonary disease) with emphysema (HCC)    PFTs 2018: severe obstructive disease, insignif response to bronchiodilator, mild restriction parenchymal pattern, moderately severe diffusion defect. 2014  FEV1 0.92  (40%), ratio 69, 27% increase in FEV1 with BD, TLC 91%, severe airtrapping, DLCO49% On chronic home O2. Pulmonary rehab referral 05/2012    Dyspnea    Fibromyalgia 08/29/2010   Gastroesophageal reflux disease    History of blood transfusion    several times  (02/19/2018)   History of clear cell renal cell carcinoma (HCC), in remission 07/21/2011   s/p cryoablation of left RCC in 09/2011 by Dr. Luverne. Followed by Dr. Matilda  [facility]) .     History of fracture of left hip 10/17/2022   History of hiatal hernia    History of mitral valve replacement with bioprosthetic valve due to mitral stenosis 2012   s/p MVR with a 27-mm pericardial porcine valve (Medtronic Mosaic valve, serial #[id number] on 09/20/10, Dr. Fleeta Ochoa)    History of obstructive sleep apnea, resolved 2013   resolved per sleep study 07/2019; no apnea, but did have desaturation.  CPAP no longer necessary.  Nocturnal polysomnography (06/2009): Moderate sleep apnea/ hypopnea syndrome , AHI 17.8 per hour with nonpositional hypopneas. CPAP titration to 12 CWP, AHI 2.4 per hour. On nocturnal CPAP via a small resMed Quattro full-face  mask with heated humidifier.    History of pneumonia    once  (02/19/2018)   History of seborrheic keratosis 09/28/2015   History of stroke without residual deficits 01/17/2022   Hyperlipidemia LDL goal < 100 11/20/2005   Hypertensive urgency 05/20/2023   Internal and external hemorrhoids without complication 08/04/2012   Lesion of left native kidney 06/01/2020   Incidental finding on recent screening chest CT for lung ca ordered by Dr. Neysa pulmonology - he has ordered a f/u renal U/S.    Lichen sclerosus of female genitalia 01/12/2017   Migraine    none in years (02/19/2018)   Mild cognitive impairment with memory loss 12/23/2021   Moderately severe major depression (HCC) 11/19/2005   Nocturnal hypoxia per sleep study 07/2019    Osteoporosis    DEXA 2016: T -2.7;  DEXA (12/09/2011): L-spine T -3.7, left hip T -1.4 DEXA (12/2004): L-spine T -2.6, left hip -0.1    Paroxysmal atrial fibrillation (HCC) 10/22/2010   s/p Left atrial maze procedure for paroxysmal atrial fibrillation on 09/20/2010 by Dr Fleeta Ochoa.  Subsequent splenic infarct, decision was made to re-anticoagulate with coumadin , likely life-long as this is the most likely cause of the splenic infarct.    Personal history of colonic polyps 05/14/2011   Colonoscopy (05/2011): 4 mm adenomatous polyp excised endoscopically Colonoscopy (02/2002): Adenomatous polyp excised endoscopically    Personal history of renal cell carcinoma 09/12/2020   Pneumonia    Pulmonary hypertension due to chronic obstructive pulmonary disease (HCC) 04/25/2016   2014 TEE w PA peak pressure 46 mmHg, s/p MV replacement    Right nephrolithiasis, asymptomatic, incidental finding 09/06/2014   5 mm non-obstructing calculus seen on CT scan 09/05/2014    Right ventricular failure (HCC) 04/25/2016   Severe obesity (BMI 35.0-39.9) with comorbidity (HCC) 10/23/2011   Sleep apnea    Stage 2 chronic kidney disease due to type 2 diabetes mellitus (HCC) 12/16/2018   CKD stage III     Systolic essential hypertension 06/19/2008   Tobacco abuse 07/28/2012   Type 2 diabetes mellitus with complication, with long-term current use of insulin  (HCC)    Type 2 diabetes mellitus with diabetic neuropathy (HCC)    Weight loss due to medication 03/12/2023    SURGICAL HISTORY Past Surgical History:  Procedure Laterality Date   BIOPSY  07/19/2021   Procedure: BIOPSY;  Surgeon: Rosalie Kitchens, MD;  Location: [facility] END[facility]COPY;  Service: Gastroenterology;;   CARDIAC CATHETERIZATION     CARDIAC VALVE REPLACEMENT  Aug. 2012   mitral valve   CAROTID ENDARTERECTOMY Right 07/04/2010   by Dr. Serene for asymptomatic right carotid artery stenosis   CATARACT EXTRACTION W/ INTRAOCULAR LENS  IMPLANT, BILATERAL Bilateral    CHEST TUBE INSERTION   09/24/2010   Dr Fleeta Trigt   COLONOSCOPY  05/12/2011   performed by Dr. Dianna. Showing small internal hemorrhoids, single tubular adenoma polyp   COLONOSCOPY WITH PROPOFOL  N/A 05/29/2020   Procedure: COLONOSCOPY WITH PROPOFOL ;  Surgeon: Rosalie Kitchens, MD;  Location: [facility];  Service: Endoscopy;  Laterality: N/A;   COLONOSCOPY WITH PROPOFOL  N/A 07/19/2021   Procedure: COLONOSCOPY WITH PROPOFOL ;  Surgeon: Rosalie Kitchens, MD;  Location: [facility];  Service: Gastroenterology;  Laterality: N/A;   CRYOABLATION Left 09/2011   by Dr. Luverne. Followed by Dr. Matilda  [facility]) .     DILATION AND CURETTAGE OF UTERUS     ESOPHAGOGASTRODUODENOSCOPY  05/12/2011   performed by Dr. Dianna. Negative for ulcerations, biopsy negative for evidence of  celiac sprue   ESOPHAGOGASTRODUODENOSCOPY N/A 06/29/2015   Procedure: ESOPHAGOGASTRODUODENOSCOPY (EGD);  Surgeon: Oliva Boots, MD;  Location: [facility] ENDOSCOPY;  Service: Endoscopy;  Laterality: N/A;   ESOPHAGOGASTRODUODENOSCOPY N/A 03/29/2016   Procedure: ESOPHAGOGASTRODUODENOSCOPY (EGD);  Surgeon: Oliva Boots, MD;  Location: [facility] ENDOSCOPY;  Service: Endoscopy;  Laterality: N/A;   ESOPHAGOGASTRODUODENOSCOPY (EGD) WITH PROPOFOL  N/A 05/29/2020   Procedure: ESOPHAGOGASTRODUODENOSCOPY (EGD) WITH PROPOFOL ;  Surgeon: Boots Oliva, MD;  Location: [facility];  Service: Endoscopy;  Laterality: N/A;   FRACTURE SURGERY     GIVENS CAPSULE STUDY N/A 06/30/2015   Procedure: GIVENS CAPSULE STUDY;  Surgeon: Oliva Boots, MD;  Location: [facility] ENDOSCOPY;  Service: Endoscopy;  Laterality: N/A;   GIVENS CAPSULE STUDY N/A 06/29/2015   Procedure: GIVENS CAPSULE STUDY;  Surgeon: Oliva Boots, MD;  Location: [facility] ENDOSCOPY;  Service: Endoscopy;  Laterality: N/A;   HEMORRHOID SURGERY  1970s?   lanced   HYSTEROSCOPY W/ ENDOMETRIAL ABLATION  06/2001   for persistent post-menopausal bleeding // by S. Addie Mody, M.D.   INTRAMEDULLARY (IM) NAIL INTERTROCHANTERIC Left 10/19/2022    Procedure: LEFT INTRAMEDULLARY (IM) NAIL INTERTROCHANTERIC;  Surgeon: Sherida Adine BROCKS, MD;  Location: [facility] OR;  Service: Orthopedics;  Laterality: Left;   IR GENERIC HISTORICAL  08/23/2015   IR RADIOLOGIST EVAL & MGMT 08/23/2015 Marcey Moan, MD [facility] INTERV RAD   IR GENERIC HISTORICAL  04/09/2016   IR RADIOLOGIST EVAL & MGMT 04/09/2016 Marcey Moan, MD [facility] INTERV RAD   IR RADIOLOGIST EVAL & MGMT  10/07/2016   IR RADIOLOGIST EVAL & MGMT  06/25/2017   IR RADIOLOGIST EVAL & MGMT  07/31/2020   IR RADIOLOGIST EVAL & MGMT  10/02/2020   IR RADIOLOGIST EVAL & MGMT  12/20/2020   IR RADIOLOGIST EVAL & MGMT  12/25/2021   IR RADIOLOGIST EVAL & MGMT  04/16/2023   LEFT HEART CATH AND CORONARY ANGIOGRAPHY N/A 04/21/2016   Procedure: Left Heart Cath and Coronary Angiography;  Surgeon: Dorn JINNY Lesches, MD;  Location: [facility] INVASIVE CV LAB;  Service: Cardiovascular;  Laterality: N/A;   LIPOMA EXCISION  08/2005   occipital lipoma 1.5cm - by Dr. Debby   LITHOTRIPSY  ~ 2000   MAZE Left 09/20/10   for paroxysmal atrial fibrillation (Dr. Fleeta Ochoa)   MITRAL VALVE REPLACEMENT  09/20/10    with a 27-mm pericardial porcine valve (Medtronic Mosaic valve, serial #[id number]). 09/20/10, Dr Fleeta Ochoa   ORIF CLAVICLE FRACTURE Right 01/2004   by Oneil BROCKS. Barbarann, M.D for Right clavicle nonunion.; it's got a pin in it   POLYPECTOMY  05/29/2020   Procedure: POLYPECTOMY;  Surgeon: Boots Oliva, MD;  Location: [facility];  Service: Endoscopy;;   POLYPECTOMY  07/19/2021   Procedure: POLYPECTOMY;  Surgeon: Boots Oliva, MD;  Location: [facility];  Service: Gastroenterology;;   RADIOLOGY WITH ANESTHESIA Left 09/12/2020   Procedure: RADIOLOGY WITH ANESTHESIA- RENAL CRYOABLATION;  Surgeon: Moan Marcey, MD;  Location: [facility] ORS;  Service: Radiology;  Laterality: Left;   REFRACTIVE SURGERY Bilateral    RIGHT HEART CATH N/A 04/23/2016   Procedure: Right Heart Cath;  Surgeon: Ezra GORMAN Shuck, MD;  Location: [facility] INVASIVE  CV LAB;  Service: Cardiovascular;  Laterality: N/A;   TONSILLECTOMY     TUBAL LIGATION      SOCIAL HISTORY Social History[1]  Social History   Social History Narrative   Lives alone in [place] ([address])   Worked at [employer] for 18 years   No car    SOCIAL DRIVERS OF HEALTH SDOH Screenings  Food Insecurity: No Food Insecurity (06/18/2023)  Recent Concern: Food Insecurity - Food Insecurity Present (05/25/2023)  Housing: Low Risk (06/18/2023)  Transportation Needs: No Transportation Needs (06/18/2023)  Recent Concern: Transportation Needs - Unmet Transportation Needs (04/08/2023)  Utilities: Not At Risk (06/18/2023)  Alcohol Screen: Low Risk (04/08/2023)  Depression (PHQ2-9): Medium Risk (07/15/2023)  Financial Resource Strain: Low Risk (04/08/2023)  Physical Activity: Inactive (04/08/2023)  Social Connections: Socially Isolated (06/18/2023)  Stress: Stress Concern Present (04/08/2023)  Tobacco Use: High Risk (01/14/2024)  Health Literacy: Adequate Health Literacy (04/08/2023)     FAMILY HISTORY Family History  Problem Relation Age of Onset   Pneumonia Mother    Peptic Ulcer Disease Father    Heart attack Father 70       Died of MI at age 86   Healthy Sister    Lupus Daughter    Obsessive Compulsive Disorder Daughter    Heart attack Brother 60       Died of MI at age 28   Obesity Brother    Breast cancer Neg Hx    BRCA 1/2 Neg Hx      ALLERGIES: is allergic to dexilant [dexlansoprazole], metoclopramide , isovue  [iopamidol ], lorazepam, oxycontin  [oxycodone ], and tramadol hcl.  MEDICATIONS  Current Outpatient Medications  Medication Sig Dispense Refill   albuterol  (VENTOLIN  HFA) 108 (90 Base) MCG/ACT inhaler Inhale 2 puffs into the lungs every 6 hours as needed for shortness of breath 54 g 10   ALPRAZolam  (XANAX ) 0.5 MG tablet Take 1 tablet (0.5 mg total) by mouth at bedtime. To help with sleep. 30 tablet 5   buPROPion  (WELLBUTRIN  XL) 150 MG 24  hr tablet Take 1 tablet (150 mg total) by mouth every morning. 90 tablet 3   carvedilol  (COREG ) 6.25 MG tablet Take 1 tablet (6.25 mg total) by mouth 2 (two) times daily with a meal. 60 tablet 11   Continuous Glucose Sensor (FREESTYLE LIBRE 3 PLUS SENSOR) MISC Change sensor every 15 days. 6 each 3   diclofenac  Sodium (VOLTAREN ) 1 % GEL Apply 4 g topically 3 (three) times daily as needed (joint pain). 50 g 11   Fluticasone -Umeclidin-Vilant (TRELEGY ELLIPTA ) 100-62.5-25 MCG/ACT AEPB Inhale 1 puff into the lungs daily. 180 each 3   furosemide  (LASIX ) 40 MG tablet Take 1 tablet (40 mg total) by mouth daily. 30 tablet 11   gabapentin  (NEURONTIN ) 600 MG tablet Take 1 tablet (600 mg total) by mouth at bedtime. 90 tablet 3   HYDROcodone -acetaminophen  (NORCO/VICODIN) 5-325 MG tablet Take 2 tablets by mouth 2 (two) times daily. 120 tablet 0   insulin  degludec (TRESIBA ) 100 UNIT/ML FlexTouch Pen Inject 19 Units into the skin every morning. 3 mL 6   Insulin  Pen Needle 32G X 4 MM MISC Use to inject insulin  daily 100 each 3   ipratropium (ATROVENT ) 0.03 % nasal spray Place 2 sprays into the nose 3 (three) times daily. 30 mL 0   ipratropium-albuterol  (DUONEB) 0.5-2.5 (3) MG/3ML SOLN Inhale 3 mLs by nebulization every 6 (six) hours as needed (shortness of breath, wheezing). 90 mL 3   lidocaine  (LIDODERM ) 5 % Place 1 or 2 patches to painful area of back each day.  Remove & Discard patch within 12 hours or as directed by MD 60 patch 2   linaclotide  (LINZESS ) 72 MCG capsule Take 1 capsule (72 mcg total) by mouth daily before breakfast. 90 capsule 3   omeprazole  (PRILOSEC) 40 MG capsule Take 1 capsule (40 mg total) by mouth 2 (two) times daily. 60  capsule 5   ondansetron  (ZOFRAN ) 4 MG tablet Take 1 tablet (4 mg total) by mouth daily as needed for nausea or vomiting. 30 tablet 1   OXYGEN  Inhale 2 L into the lungs at bedtime.     potassium chloride  (KLOR-CON  M) 10 MEQ tablet Take 1 tablet (10 mEq total)  by mouth daily. 30 tablet 11   ramelteon  (ROZEREM ) 8 MG tablet Take 1 tablet (8 mg total) by mouth at bedtime. 30 tablet 3   rosuvastatin  (CRESTOR ) 20 MG tablet Take 1 tablet (20 mg total) by mouth daily. 30 tablet 11   sacubitril -valsartan  (ENTRESTO ) 97-103 MG Take 1 tablet by mouth 2 (two) times daily. 180 tablet 3   venlafaxine  XR (EFFEXOR -XR) 150 MG 24 hr capsule Take 1 capsule (150 mg total) by mouth daily with breakfast. 90 capsule 3   warfarin (COUMADIN ) 2.5 MG tablet Current instructions:  Take 1 & 1/2 of your 2.5 mg strength warfarin tablets on Mondays, Wednesdays and Fridays. All OTHER days, take ONLY ONE (1) tablet. Continue to perform your patient self testing, finger-stick, point of care INRs and report results to Pharmacist, James Groce. 108 tablet 1   No current facility-administered medications for this visit.    PHYSICAL EXAMINATION: ECOG PERFORMANCE STATUS: 1 - Symptomatic but completely ambulatory VITALS: Vitals:   01/25/24 1227  BP: (!) 130/43  Pulse: (!) 58  Resp: 16  Temp: 98.1 F (36.7 C)  SpO2: 97%   Filed Weights   01/25/24 1227  Weight: 137 lb 11.2 oz (62.5 kg)   Body mass index is 25.19 kg/m.  GENERAL: alert, in no acute distress and comfortable SKIN: no acute rashes, no significant lesions EYES: conjunctiva are pink and non-injected, sclera anicteric OROPHARYNX: MMM, no exudates, no oropharyngeal erythema or ulceration NECK: supple, no JVD LYMPH:  no palpable lymphadenopathy in the cervical, axillary or inguinal regions LUNGS: clear to auscultation b/l with normal respiratory effort HEART: regular rate & rhythm ABDOMEN:  normoactive bowel sounds , non tender, not distended, no hepatosplenomegaly Extremity: no pedal edema PSYCH: alert & oriented x 3 with fluent speech NEURO: no focal motor/sensory deficits  LABORATORY DATA:   I have reviewed the data as listed     Latest Ref Rng & Units 01/25/2024   12:00 PM 09/24/2023    9:41 AM  07/20/2023    1:57 PM  CBC EXTENDED  WBC 4.0 - 10.5 K/uL 7.8  6.8  6.9   RBC 3.87 - 5.11 MIL/uL 4.26  4.40  3.93   Hemoglobin 12.0 - 15.0 g/dL 86.5  86.3  89.1   HCT 36.0 - 46.0 % 40.1  41.9  34.9   Platelets 150 - 400 K/uL 196  201  204   NEUT# 1.7 - 7.7 K/uL 5.4   4.6   Lymph# 0.7 - 4.0 K/uL 1.6   1.4         Latest Ref Rng & Units 01/25/2024   12:00 PM 07/20/2023    1:57 PM 07/15/2023   12:55 PM  CMP  Glucose 70 - 99 mg/dL 842  861  883   BUN 8 - 23 mg/dL 18  19  26    Creatinine 0.44 - 1.00 mg/dL 9.00  9.27  8.99   Sodium 135 - 145 mmol/L 141  140  138   Potassium 3.5 - 5.1 mmol/L 4.8  4.5  4.2   Chloride 98 - 111 mmol/L 104  105  105   CO2 22 - 32 mmol/L 28  32  22   Calcium  8.9 - 10.3 mg/dL 9.8  9.8  9.3   Total Protein 6.5 - 8.1 g/dL 6.8  6.2  5.7   Total Bilirubin 0.0 - 1.2 mg/dL 0.4  0.4  0.5   Alkaline Phos 38 - 126 U/L 58  56  48   AST 15 - 41 U/L 17  14  15    ALT 0 - 44 U/L 15  17  15     LDH Component     Latest Ref Rng 01/25/2024  LDH     105 - 235 U/L 178    Iron  and Iron  Binding Capacity Component     Latest Ref Rng 01/25/2024  Iron      28 - 170 ug/dL 76   TIBC     749 - 549 ug/dL 635   Saturation Ratios     10.4 - 31.8 % 21   UIBC     ug/dL 711      RADIOGRAPHIC STUDIES: I have personally reviewed the radiological images as listed and agreed with the findings in the report. No results found.  ASSESSMENT & PLAN:  73 y.o. female with  Chronic iron  deficiency anemia Likely chronic slow GI losses. Also on chronic PPI therapy  PLAN: - Discussed lab results on 01/25/2024 in detail with patient: - CMP   - Creatinine: 0.99  - Calcium : 9.8 - CBC:  - WBC: 7.8  - Hemoglobin: 13.4  - Platelets: 196 - LDH is 178 - Iron  Saturation is 21  - Ferritin is 30  FOLLOW-UP in 6 months for labs and follow-up with Dr. Onesimo.  The total time spent in the appointment was *** minutes* .  All of the patient's questions were answered and the patient knows  to call the clinic with any problems, questions, or concerns.  Emaline Onesimo MD MS AAHIVMS [facility] Hematology/Oncology Physician [facility]  *Total Encounter Time as defined by the Centers for Medicare and Medicaid Services includes, in addition to the face-to-face time of a patient visit (documented in the note above) non-face-to-face time: obtaining and reviewing outside history, ordering and reviewing medications, tests or procedures, care coordination (communications with other health care professionals or caregivers) and documentation in the medical record.  I, Marijo Sharps, acting as a neurosurgeon for Emaline Onesimo, MD.,have documented all relevant documentation on the behalf of Emaline Onesimo, MD,as directed by  Emaline Onesimo, MD while in the presence of Emaline Onesimo, MD.  I have reviewed the above documentation for accuracy and completeness, and I agree with the above.  Emaline Onesimo, MD        [1] Social History Tobacco Use   Smoking status: Every Day    Current packs/day: 1.00    Average packs/day: 1 pack/day for 61.2 years (61.2 ttl pk-yrs)    Types: Cigarettes    Start date: 11/01/1962   Smokeless tobacco: Never   Tobacco comments:    At least 1 PPD  Vaping Use   Vaping status: Never Used  Substance Use Topics   Alcohol use: Not Currently    Comment: 02/19/2018 nothing since 1999   Drug use: Not Currently    Types: Marijuana    Comment: 02/19/2018 nothing since the 1990s  "

## 2024-02-01 NOTE — Telephone Encounter (Signed)
 Pt provided results of PST FS POC INR = 2.5 (ragne 2..0 - 2.5) on 1.25 mg warfarin on M/Th; 2.5 mg warfarin all other days. No bleeding no symptoms of embolic evetns. No missed doses. Advised to continue same regimen. Repeat INR 29DEC25.

## 2024-02-02 ENCOUNTER — Other Ambulatory Visit: Payer: Self-pay

## 2024-02-02 ENCOUNTER — Other Ambulatory Visit (HOSPITAL_COMMUNITY): Payer: Self-pay

## 2024-02-02 ENCOUNTER — Other Ambulatory Visit: Payer: Self-pay | Admitting: Internal Medicine

## 2024-02-02 DIAGNOSIS — G8929 Other chronic pain: Secondary | ICD-10-CM

## 2024-02-02 DIAGNOSIS — F5104 Psychophysiologic insomnia: Secondary | ICD-10-CM

## 2024-02-02 MED ORDER — ALPRAZOLAM 0.5 MG PO TABS
0.5000 mg | ORAL_TABLET | Freq: Every day | ORAL | 5 refills | Status: AC
  Filled 2024-02-02: qty 30, 30d supply, fill #0
  Filled 2024-03-04: qty 30, 30d supply, fill #1

## 2024-02-02 MED ORDER — HYDROCODONE-ACETAMINOPHEN 5-325 MG PO TABS
2.0000 | ORAL_TABLET | Freq: Two times a day (BID) | ORAL | 0 refills | Status: DC
  Filled 2024-02-02: qty 120, 30d supply, fill #0

## 2024-02-02 NOTE — Progress Notes (Signed)
 Contacted pt per Dr Onesimo: to let patient know her ferritin is low at 30 -- Orders for monoferric placed if patient agreeable  Pt acknowledged information and verbalized understanding. Appt already made for pt to get Iron  at [facility].

## 2024-02-03 ENCOUNTER — Other Ambulatory Visit (HOSPITAL_COMMUNITY): Payer: Self-pay

## 2024-02-09 ENCOUNTER — Encounter (HOSPITAL_COMMUNITY): Payer: Self-pay

## 2024-02-09 ENCOUNTER — Telehealth: Payer: Self-pay | Admitting: *Deleted

## 2024-02-09 NOTE — Telephone Encounter (Signed)
 INR 2.2 02/09/2024 12:21:07 PM.

## 2024-02-10 ENCOUNTER — Other Ambulatory Visit: Payer: Self-pay

## 2024-02-10 ENCOUNTER — Other Ambulatory Visit: Payer: Self-pay | Admitting: Internal Medicine

## 2024-02-10 ENCOUNTER — Other Ambulatory Visit (HOSPITAL_COMMUNITY): Payer: Self-pay

## 2024-02-10 DIAGNOSIS — J449 Chronic obstructive pulmonary disease, unspecified: Secondary | ICD-10-CM

## 2024-02-10 MED ORDER — IPRATROPIUM-ALBUTEROL 0.5-2.5 (3) MG/3ML IN SOLN
3.0000 mL | Freq: Four times a day (QID) | RESPIRATORY_TRACT | 3 refills | Status: AC | PRN
  Filled 2024-02-10: qty 90, 8d supply, fill #0

## 2024-02-10 NOTE — Telephone Encounter (Signed)
 Copied from CRM #[id number]. Topic: Clinical - Medication Refill >> Feb 10, 2024  9:47 AM Carrielelia G wrote: Medication: ipratropium (ATROVENT ) 0.03 % nasal spray  Has the patient contacted their pharmacy? No (Agent: If no, request that the patient contact the pharmacy for the refill. If patient does not wish to contact the pharmacy document the reason why and proceed with request.) (Agent: If yes, when and what did the pharmacy advise?)  This is the patient's preferred pharmacy:  [facility] [address] [place] [address] Phone: [phone] Fax: [phone]  Is this the correct pharmacy for this prescription? Yes If no, delete pharmacy and type the correct one.   Has the prescription been filled recently? No  Is the patient out of the medication? Yes  Has the patient been seen for an appointment in the last year OR does the patient have an upcoming appointment? Yes  Can we respond through MyChart? Yes  Agent: Please be advised that Rx refills may take up to 3 business days. We ask that you follow-up with your pharmacy.

## 2024-02-11 ENCOUNTER — Encounter (HOSPITAL_COMMUNITY): Payer: Self-pay | Admitting: Hematology

## 2024-02-12 ENCOUNTER — Other Ambulatory Visit: Payer: Self-pay | Admitting: Internal Medicine

## 2024-02-12 ENCOUNTER — Other Ambulatory Visit (HOSPITAL_COMMUNITY): Payer: Self-pay

## 2024-02-15 ENCOUNTER — Telehealth: Payer: Self-pay | Admitting: *Deleted

## 2024-02-15 MED ORDER — IPRATROPIUM BROMIDE 0.03 % NA SOLN
2.0000 | Freq: Three times a day (TID) | NASAL | 0 refills | Status: AC
  Filled 2024-02-15: qty 30, 50d supply, fill #0

## 2024-02-15 NOTE — Telephone Encounter (Signed)
 RTC to patient informed patient that in a note by Dr. Trudy her Carvedilol  is  replacing the Toprol . Patient states she is taking both medications.  Has not experienced any low blood pressures.  Stated that the blood pressures usually read high.  Informed patient that I will send a message to Dr.  Trudy to see what she would like for her to do.          Copied from CRM [id number]. Topic: Clinical - Prescription Issue >> Feb 15, 2024  9:13 AM Graeme ORN wrote: Reason for CRM: Patient called. States medication missing from her list - she would like to discuss with someone in clinic. metoprolol  succinate (TOPROL  XL) 25 MG 24 hr tablet Thank You

## 2024-02-15 NOTE — Telephone Encounter (Signed)
 Pt reported PST FS POC INR results 2.30 on 1 x 2.5 mg warfarin on Su/Tu/We/Fri/Sa abd 1/2 x 2.5mg  (1.25 mg dose) on Monday/Thursday. No bleeding, no new medications, no missed doses. Advised to continue same regimen; repeat INR [id number].

## 2024-02-16 ENCOUNTER — Other Ambulatory Visit: Payer: Self-pay

## 2024-02-16 ENCOUNTER — Telehealth: Payer: Self-pay | Admitting: *Deleted

## 2024-02-16 ENCOUNTER — Other Ambulatory Visit (HOSPITAL_COMMUNITY): Payer: Self-pay

## 2024-02-16 DIAGNOSIS — Z794 Long term (current) use of insulin: Secondary | ICD-10-CM

## 2024-02-16 DIAGNOSIS — J449 Chronic obstructive pulmonary disease, unspecified: Secondary | ICD-10-CM

## 2024-02-16 NOTE — Progress Notes (Signed)
 Complex Care Management Note  Care Guide Note 02/16/2024 Name: Jissel Slavens MRN: [id number] DOB: 09-09-1950  Odell Gaines Mcgillivray is a 74 y.o. year old female who sees Trudy, Mliss Dragon, MD for primary care. I reached out to [name] by phone today to offer complex care management services.  Ms. Leiterman was given information about Complex Care Management services today including:   The Complex Care Management services include support from the care team which includes your Nurse Care Manager, Clinical Social Worker, or Pharmacist.  The Complex Care Management team is here to help remove barriers to the health concerns and goals most important to you. Complex Care Management services are voluntary, and the patient may decline or stop services at any time by request to their care team member.   Complex Care Management Consent Status: Patient agreed to services and verbal consent obtained.   Follow up plan:  Telephone appointment with complex care management team member scheduled for:  02/25/24  Encounter Outcome:  Patient Scheduled  Harlene Satterfield  [facility]  [facility], [facility] Guide  Direct Dial: [phone]  Fax [phone]

## 2024-02-18 ENCOUNTER — Encounter (HOSPITAL_COMMUNITY): Payer: Self-pay | Admitting: Hematology

## 2024-02-18 ENCOUNTER — Ambulatory Visit (HOSPITAL_COMMUNITY)
Admission: RE | Admit: 2024-02-18 | Discharge: 2024-02-18 | Disposition: A | Discharge status: Home / Self Care | Source: Ambulatory Visit | Attending: Hematology | Admitting: Hematology

## 2024-02-18 VITALS — BP 138/62 | HR 54 | Temp 98.4°F | Resp 15

## 2024-02-18 DIAGNOSIS — D500 Iron deficiency anemia secondary to blood loss (chronic): Secondary | ICD-10-CM | POA: Insufficient documentation

## 2024-02-18 MED ORDER — ACETAMINOPHEN 325 MG PO TABS: 650.0000 mg | ORAL_TABLET | Freq: Once | ORAL | Status: DC

## 2024-02-18 MED ORDER — SODIUM CHLORIDE 0.9 % IV SOLN
1000.0000 mg | Freq: Once | INTRAVENOUS | Status: AC
  Administered 2024-02-18: 1000 mg via INTRAVENOUS
  Filled 2024-02-18: qty 1000

## 2024-02-18 MED ORDER — LORATADINE 10 MG PO TABS: 10.0000 mg | ORAL_TABLET | Freq: Once | ORAL | Status: DC

## 2024-02-23 NOTE — Telephone Encounter (Signed)
 Called pt after she reported via text, her results of her FS, POC, PST INR = 2.6 on 15 mg warf/wk. Target INR range 2.0 - 2.5. Will decrease from 15mg /wk to 13.75 mg warf per wk.

## 2024-02-24 ENCOUNTER — Telehealth: Payer: Self-pay | Admitting: Dietician

## 2024-02-24 ENCOUNTER — Other Ambulatory Visit: Payer: Self-pay | Admitting: Pharmacist

## 2024-02-24 ENCOUNTER — Other Ambulatory Visit: Payer: Self-pay

## 2024-02-24 DIAGNOSIS — E1169 Type 2 diabetes mellitus with other specified complication: Secondary | ICD-10-CM

## 2024-02-24 NOTE — Telephone Encounter (Signed)
 Patient calls asking diabetes educator to Look at high blood sugars. They have been gradually increasing since October from 157.166>168>181> 192 for past two weeks.  She states I am probably doing it to myself and asks if she should increase insulin  vs restart Ozempic - when started on low dose Ozempic  a few years ago states she had no weight loss, no lows, better blood sugars, higher dose after that was what caused her weight loss. . 19 units Tresiba  daily.   Pt prefers low dose Ozempic   CGM Results from download:  Report from 02/11/24 to 02/24/24  % Time CGM active:   91 %   (Goal >70%)  Average glucose:   192 mg/dL for 14 days  Glucose management indicator:   7.9 %  Time in range (70-180 mg/dL):   46 %   (Goal >29%)  Time High (181-250 mg/dL):   44 %   (Goal < 74%)  Time Very High (>250 mg/dL):    0 %   (Goal < 5%)  Time Low (54-69 mg/dL):   0 %   (Goal <5%)  Time Very Low (<54 mg/dL):   0 %   (Goal <8%)  Coefficient of variation:   21.7% %   (Goal <36%)   Note- patient also asked about her appointments with Dr. Trudy and asks due to increased falls and a care manager recommending it, if Dr. Trudy can order her a quad cane

## 2024-02-25 ENCOUNTER — Other Ambulatory Visit: Payer: Self-pay | Admitting: *Deleted

## 2024-02-25 ENCOUNTER — Other Ambulatory Visit: Payer: Self-pay

## 2024-02-25 MED ORDER — ALBUTEROL SULFATE HFA 108 (90 BASE) MCG/ACT IN AERS
INHALATION_SPRAY | RESPIRATORY_TRACT | 10 refills | Status: AC
  Filled 2024-02-25 – 2024-02-26 (×2): qty 54, 75d supply, fill #0

## 2024-02-25 MED ORDER — WARFARIN SODIUM 2.5 MG PO TABS
ORAL_TABLET | ORAL | 1 refills | Status: AC
  Filled 2024-02-25: qty 108, 84d supply, fill #0

## 2024-02-26 ENCOUNTER — Other Ambulatory Visit (HOSPITAL_COMMUNITY): Payer: Self-pay

## 2024-02-26 ENCOUNTER — Other Ambulatory Visit: Payer: Self-pay

## 2024-02-27 ENCOUNTER — Other Ambulatory Visit (HOSPITAL_COMMUNITY): Payer: Self-pay

## 2024-02-29 ENCOUNTER — Other Ambulatory Visit: Payer: Self-pay

## 2024-03-01 ENCOUNTER — Other Ambulatory Visit: Payer: Self-pay

## 2024-03-01 NOTE — Telephone Encounter (Signed)
 Eft a message with new Tresiba  order of 21 units daily. Suggested patient monitor for 1-2 weeks and call back if blood sugar has not improved.

## 2024-03-04 ENCOUNTER — Other Ambulatory Visit: Payer: Self-pay

## 2024-03-07 NOTE — Telephone Encounter (Signed)
 Pt provided results of PST FS POC INR = 2.30 on 13.75 mg warfarin/wk. No missed doses, no bleeding. No new medications. Advised to continue same regimen: Su-2.5mg M-1.25mg T-1.25mg W-2.5mg Th-1.25mg F-2.5mg Sa-2.5mg  Repeat PST INR 03-14-24.

## 2024-03-08 ENCOUNTER — Telehealth: Payer: Self-pay | Admitting: *Deleted

## 2024-03-08 ENCOUNTER — Other Ambulatory Visit: Payer: Self-pay

## 2024-03-08 DIAGNOSIS — G8929 Other chronic pain: Secondary | ICD-10-CM

## 2024-03-08 NOTE — Telephone Encounter (Signed)
*  Correction to previous result.     INR-2.3 03/07/2024 10:01 :05 AM.  Will forward to Dr. Ruthell.

## 2024-03-08 NOTE — Telephone Encounter (Signed)
 Last Prescription for the [facility]  02/02/2024.  ToxAssure 12/16/2018 WNL.  Last appointment 12/10/2023.  Next appointment 04/28/2024.Copied from CRM #[id number]. Topic: Clinical - Medication Refill >> Mar 07, 2024 12:58 PM Mercer PEDLAR wrote: Medication:  HYDROcodone -acetaminophen  (NORCO/VICODIN) 5-325 MG tablet ramelteon  (ROZEREM ) 8 MG tablet insulin  degludec (TRESIBA ) 100 UNIT/ML FlexTouch Pen omeprazole  (PRILOSEC) 40 MG capsule Continuous Glucose Sensor (FREESTYLE LIBRE 3 PLUS SENSOR) MISC venlafaxine  XR (EFFEXOR -XR) 150 MG 24 hr capsule Fluticasone -Umeclidin-Vilant (TRELEGY ELLIPTA ) 100-62.5-25 MCG/ACT AEPB   Has the patient contacted their pharmacy? Yes (Agent: If no, request that the patient contact the pharmacy for the refill. If patient does not wish to contact the pharmacy document the reason why and proceed with request.) (Agent: If yes, when and what did the pharmacy advise?)  This is the patient's preferred pharmacy:  [facility] [address] [place] [address] Phone: [phone] Fax: [phone]  Is this the correct pharmacy for this prescription? Yes If no, delete pharmacy and type the correct one.   Has the prescription been filled recently? No  Is the patient out of the medication? No  Has the patient been seen for an appointment in the last year OR does the patient have an upcoming appointment? Yes  Can we respond through MyChart? No  Agent: Please be advised that Rx refills may take up to 3 business days. We ask that you follow-up with your pharmacy.

## 2024-03-09 ENCOUNTER — Other Ambulatory Visit: Payer: Self-pay

## 2024-03-09 ENCOUNTER — Other Ambulatory Visit (HOSPITAL_COMMUNITY): Payer: Self-pay

## 2024-03-09 MED ORDER — HYDROCODONE-ACETAMINOPHEN 5-325 MG PO TABS
2.0000 | ORAL_TABLET | Freq: Two times a day (BID) | ORAL | 0 refills | Status: AC
  Filled 2024-03-09: qty 120, 30d supply, fill #0

## 2024-03-10 ENCOUNTER — Ambulatory Visit: Admitting: Internal Medicine

## 2024-03-10 ENCOUNTER — Other Ambulatory Visit: Payer: Self-pay

## 2024-03-10 ENCOUNTER — Other Ambulatory Visit: Payer: Self-pay | Admitting: Internal Medicine

## 2024-03-10 ENCOUNTER — Other Ambulatory Visit (HOSPITAL_COMMUNITY): Payer: Self-pay

## 2024-03-10 DIAGNOSIS — D509 Iron deficiency anemia, unspecified: Secondary | ICD-10-CM

## 2024-03-10 DIAGNOSIS — G4700 Insomnia, unspecified: Secondary | ICD-10-CM

## 2024-03-10 DIAGNOSIS — K219 Gastro-esophageal reflux disease without esophagitis: Secondary | ICD-10-CM

## 2024-03-10 DIAGNOSIS — E1169 Type 2 diabetes mellitus with other specified complication: Secondary | ICD-10-CM

## 2024-03-10 MED ORDER — OMEPRAZOLE 40 MG PO CPDR
40.0000 mg | DELAYED_RELEASE_CAPSULE | Freq: Two times a day (BID) | ORAL | 5 refills | Status: AC
  Filled 2024-03-10: qty 60, 30d supply, fill #0

## 2024-03-10 MED ORDER — RAMELTEON 8 MG PO TABS
8.0000 mg | ORAL_TABLET | Freq: Every day | ORAL | 3 refills | Status: AC
  Filled 2024-03-10: qty 30, 30d supply, fill #0

## 2024-03-11 ENCOUNTER — Other Ambulatory Visit: Payer: Self-pay

## 2024-03-11 MED ORDER — TRESIBA FLEXTOUCH 100 UNIT/ML ~~LOC~~ SOPN
19.0000 [IU] | PEN_INJECTOR | Freq: Every morning | SUBCUTANEOUS | 6 refills | Status: AC
  Filled 2024-03-11 – 2024-03-13 (×2): qty 3, 15d supply, fill #0

## 2024-03-12 ENCOUNTER — Other Ambulatory Visit (HOSPITAL_COMMUNITY): Payer: Self-pay

## 2024-03-13 ENCOUNTER — Other Ambulatory Visit (HOSPITAL_COMMUNITY): Payer: Self-pay

## 2024-03-14 ENCOUNTER — Other Ambulatory Visit: Payer: Self-pay

## 2024-03-14 ENCOUNTER — Telehealth: Payer: Self-pay | Admitting: Pharmacy Technician

## 2024-03-14 ENCOUNTER — Other Ambulatory Visit (HOSPITAL_COMMUNITY): Payer: Self-pay

## 2024-03-14 NOTE — Progress Notes (Cosign Needed Addendum)
" ° °  03/14/2024  Patient ID: [name], female   DOB: [birth year], 74 y.o.   MRN: [id number]  Pharmacy Quality Measure Review  This patient failed the 2025 adherence measure for cholesterol (statin) medications this calendar year.  Medication: Rosuvastatin  20mg  Last fill date: 03/10/2024 for 30 day supply. Previous filles included 30 day supplies on 12/23, 11/24, 10/30, 10/2,  Insurance report was not up to date. No action needed at this time.  Patient appears to be filling it regularly at this time.  Called [facility] and spoke to pharmacy staff. Pharmacy staff [name]) informs the medication should be delivered this week, no later than Thursday, as they have received patient's updated payment information.  [name], CPhT [facility] Office: [phone] Email: [name]  "

## 2024-03-14 NOTE — Telephone Encounter (Signed)
 Pt reported INR results from PST/FS/POC device at home = 1.7 on 13.75 mg warfarinwk. Advised to INCREASE to 15 mg warfarin/wk. Repeat INR 9-FEB-26.

## 2024-03-15 ENCOUNTER — Telehealth: Payer: Self-pay | Admitting: *Deleted

## 2024-03-16 ENCOUNTER — Other Ambulatory Visit (HOSPITAL_COMMUNITY): Payer: Self-pay

## 2024-03-16 ENCOUNTER — Other Ambulatory Visit: Payer: Self-pay

## 2024-04-04 ENCOUNTER — Telehealth: Admitting: *Deleted

## 2024-04-13 ENCOUNTER — Ambulatory Visit: Payer: PPO

## 2024-04-14 ENCOUNTER — Ambulatory Visit: Admitting: Podiatry

## 2024-04-28 ENCOUNTER — Ambulatory Visit: Admitting: Internal Medicine

## 2024-07-26 ENCOUNTER — Inpatient Hospital Stay: Payer: Self-pay | Attending: Hematology

## 2024-07-26 ENCOUNTER — Inpatient Hospital Stay: Payer: Self-pay | Admitting: Hematology
